# Patient Record
Sex: Male | Born: 1940
Health system: Southern US, Community
[De-identification: ages and names within clinical notes are randomized; demographics above are authoritative.]

## PROBLEM LIST (undated history)

## (undated) DIAGNOSIS — K635 Polyp of colon: Secondary | ICD-10-CM

## (undated) DIAGNOSIS — R251 Tremor, unspecified: Secondary | ICD-10-CM

## (undated) DIAGNOSIS — K439 Ventral hernia without obstruction or gangrene: Secondary | ICD-10-CM

## (undated) DIAGNOSIS — D649 Anemia, unspecified: Secondary | ICD-10-CM

## (undated) DIAGNOSIS — I4719 Other supraventricular tachycardia: Secondary | ICD-10-CM

## (undated) DIAGNOSIS — Z9359 Other cystostomy status: Secondary | ICD-10-CM

## (undated) DIAGNOSIS — G459 Transient cerebral ischemic attack, unspecified: Secondary | ICD-10-CM

## (undated) DIAGNOSIS — I2510 Atherosclerotic heart disease of native coronary artery without angina pectoris: Secondary | ICD-10-CM

## (undated) DIAGNOSIS — I6529 Occlusion and stenosis of unspecified carotid artery: Secondary | ICD-10-CM

## (undated) DIAGNOSIS — R51 Headache: Secondary | ICD-10-CM

## (undated) DIAGNOSIS — J189 Pneumonia, unspecified organism: Secondary | ICD-10-CM

## (undated) DIAGNOSIS — G35D Multiple sclerosis, unspecified: Secondary | ICD-10-CM

## (undated) DIAGNOSIS — E785 Hyperlipidemia, unspecified: Secondary | ICD-10-CM

## (undated) DIAGNOSIS — G709 Myoneural disorder, unspecified: Secondary | ICD-10-CM

## (undated) DIAGNOSIS — G8929 Other chronic pain: Secondary | ICD-10-CM

## (undated) DIAGNOSIS — I498 Other specified cardiac arrhythmias: Secondary | ICD-10-CM

## (undated) DIAGNOSIS — G4700 Insomnia, unspecified: Secondary | ICD-10-CM

## (undated) DIAGNOSIS — I739 Peripheral vascular disease, unspecified: Secondary | ICD-10-CM

## (undated) DIAGNOSIS — M1990 Unspecified osteoarthritis, unspecified site: Secondary | ICD-10-CM

## (undated) DIAGNOSIS — I10 Essential (primary) hypertension: Secondary | ICD-10-CM

## (undated) DIAGNOSIS — R269 Unspecified abnormalities of gait and mobility: Secondary | ICD-10-CM

## (undated) DIAGNOSIS — E870 Hyperosmolality and hypernatremia: Secondary | ICD-10-CM

## (undated) DIAGNOSIS — E876 Hypokalemia: Secondary | ICD-10-CM

## (undated) DIAGNOSIS — A0472 Enterocolitis due to Clostridium difficile, not specified as recurrent: Secondary | ICD-10-CM

## (undated) DIAGNOSIS — R0989 Other specified symptoms and signs involving the circulatory and respiratory systems: Secondary | ICD-10-CM

## (undated) DIAGNOSIS — I471 Supraventricular tachycardia: Secondary | ICD-10-CM

## (undated) DIAGNOSIS — L98429 Non-pressure chronic ulcer of back with unspecified severity: Secondary | ICD-10-CM

## (undated) DIAGNOSIS — N390 Urinary tract infection, site not specified: Secondary | ICD-10-CM

## (undated) DIAGNOSIS — J8410 Pulmonary fibrosis, unspecified: Secondary | ICD-10-CM

## (undated) DIAGNOSIS — D126 Benign neoplasm of colon, unspecified: Secondary | ICD-10-CM

## (undated) DIAGNOSIS — M549 Dorsalgia, unspecified: Secondary | ICD-10-CM

## (undated) DIAGNOSIS — Z87442 Personal history of urinary calculi: Secondary | ICD-10-CM

## (undated) DIAGNOSIS — G9340 Encephalopathy, unspecified: Secondary | ICD-10-CM

## (undated) DIAGNOSIS — I679 Cerebrovascular disease, unspecified: Secondary | ICD-10-CM

## (undated) DIAGNOSIS — G4733 Obstructive sleep apnea (adult) (pediatric): Secondary | ICD-10-CM

## (undated) DIAGNOSIS — G4730 Sleep apnea, unspecified: Secondary | ICD-10-CM

## (undated) DIAGNOSIS — N200 Calculus of kidney: Secondary | ICD-10-CM

## (undated) DIAGNOSIS — R519 Headache, unspecified: Secondary | ICD-10-CM

## (undated) DIAGNOSIS — G35 Multiple sclerosis: Secondary | ICD-10-CM

## (undated) DIAGNOSIS — E039 Hypothyroidism, unspecified: Secondary | ICD-10-CM

## (undated) DIAGNOSIS — I639 Cerebral infarction, unspecified: Secondary | ICD-10-CM

## (undated) DIAGNOSIS — R911 Solitary pulmonary nodule: Secondary | ICD-10-CM

## (undated) HISTORY — DX: Polyp of colon: K63.5

## (undated) HISTORY — DX: Insomnia, unspecified: G47.00

## (undated) HISTORY — DX: Obstructive sleep apnea (adult) (pediatric): G47.33

## (undated) HISTORY — PX: SUPRAPUBIC CATHETER INSERTION: SUR719

## (undated) HISTORY — DX: Atherosclerotic heart disease of native coronary artery without angina pectoris: I25.10

## (undated) HISTORY — DX: Other supraventricular tachycardia: I47.19

## (undated) HISTORY — DX: Unspecified osteoarthritis, unspecified site: M19.90

## (undated) HISTORY — DX: Peripheral vascular disease, unspecified: I73.9

## (undated) HISTORY — PX: BACK SURGERY: SHX140

## (undated) HISTORY — PX: CHOLECYSTECTOMY: SHX55

## (undated) HISTORY — DX: Headache: R51

## (undated) HISTORY — DX: Headache, unspecified: R51.9

## (undated) HISTORY — DX: Other specified symptoms and signs involving the circulatory and respiratory systems: R09.89

## (undated) HISTORY — DX: Unspecified abnormalities of gait and mobility: R26.9

## (undated) HISTORY — DX: Cerebrovascular disease, unspecified: I67.9

## (undated) HISTORY — DX: Calculus of kidney: N20.0

## (undated) HISTORY — DX: Transient cerebral ischemic attack, unspecified: G45.9

## (undated) HISTORY — DX: Multiple sclerosis, unspecified: G35.D

## (undated) HISTORY — DX: Occlusion and stenosis of unspecified carotid artery: I65.29

## (undated) HISTORY — DX: Hyperlipidemia, unspecified: E78.5

## (undated) HISTORY — DX: Multiple sclerosis: G35

## (undated) HISTORY — DX: Supraventricular tachycardia: I47.1

## (undated) HISTORY — DX: Benign neoplasm of colon, unspecified: D12.6

---

## 1969-09-29 HISTORY — PX: INGUINAL HERNIA REPAIR: SUR1180

## 2001-05-24 ENCOUNTER — Ambulatory Visit (HOSPITAL_COMMUNITY): Admission: RE | Admit: 2001-05-24 | Discharge: 2001-05-24 | Payer: Self-pay | Admitting: Neurology

## 2001-05-24 ENCOUNTER — Encounter: Payer: Self-pay | Admitting: Neurology

## 2001-09-24 ENCOUNTER — Emergency Department (HOSPITAL_COMMUNITY): Admission: EM | Admit: 2001-09-24 | Discharge: 2001-09-24 | Payer: Self-pay | Admitting: Emergency Medicine

## 2001-09-24 ENCOUNTER — Encounter: Payer: Self-pay | Admitting: Emergency Medicine

## 2001-09-30 ENCOUNTER — Ambulatory Visit (HOSPITAL_COMMUNITY): Admission: RE | Admit: 2001-09-30 | Discharge: 2001-09-30 | Payer: Self-pay | Admitting: Neurology

## 2001-09-30 ENCOUNTER — Encounter: Payer: Self-pay | Admitting: Neurology

## 2002-10-07 ENCOUNTER — Ambulatory Visit (HOSPITAL_COMMUNITY): Admission: RE | Admit: 2002-10-07 | Discharge: 2002-10-07 | Payer: Self-pay | Admitting: Cardiology

## 2003-09-27 ENCOUNTER — Ambulatory Visit (HOSPITAL_COMMUNITY): Admission: RE | Admit: 2003-09-27 | Discharge: 2003-09-27 | Payer: Self-pay | Admitting: Cardiology

## 2004-08-28 ENCOUNTER — Ambulatory Visit (HOSPITAL_COMMUNITY): Admission: RE | Admit: 2004-08-28 | Discharge: 2004-08-28 | Payer: Self-pay | Admitting: Neurology

## 2004-09-04 ENCOUNTER — Encounter (HOSPITAL_COMMUNITY): Admission: RE | Admit: 2004-09-04 | Discharge: 2004-10-08 | Payer: Self-pay | Admitting: Neurology

## 2004-11-27 HISTORY — PX: COLONOSCOPY: SHX174

## 2004-12-09 ENCOUNTER — Ambulatory Visit: Payer: Self-pay | Admitting: Internal Medicine

## 2004-12-09 ENCOUNTER — Inpatient Hospital Stay (HOSPITAL_COMMUNITY): Admission: EM | Admit: 2004-12-09 | Discharge: 2004-12-15 | Payer: Self-pay | Admitting: *Deleted

## 2004-12-25 ENCOUNTER — Ambulatory Visit: Payer: Self-pay | Admitting: Internal Medicine

## 2005-01-05 ENCOUNTER — Ambulatory Visit: Admission: RE | Admit: 2005-01-05 | Discharge: 2005-01-05 | Payer: Self-pay | Admitting: Family Medicine

## 2005-01-24 ENCOUNTER — Ambulatory Visit: Payer: Self-pay | Admitting: Pulmonary Disease

## 2005-01-27 ENCOUNTER — Ambulatory Visit: Payer: Self-pay | Admitting: Internal Medicine

## 2005-01-27 HISTORY — PX: COLONOSCOPY: SHX174

## 2005-01-30 ENCOUNTER — Ambulatory Visit: Payer: Self-pay | Admitting: Internal Medicine

## 2005-01-30 ENCOUNTER — Ambulatory Visit (HOSPITAL_COMMUNITY): Admission: RE | Admit: 2005-01-30 | Discharge: 2005-01-30 | Payer: Self-pay | Admitting: Internal Medicine

## 2005-05-30 HISTORY — PX: COLONOSCOPY: SHX174

## 2005-06-05 ENCOUNTER — Emergency Department (HOSPITAL_COMMUNITY): Admission: EM | Admit: 2005-06-05 | Discharge: 2005-06-05 | Payer: Self-pay | Admitting: Emergency Medicine

## 2005-06-18 ENCOUNTER — Ambulatory Visit (HOSPITAL_COMMUNITY): Admission: RE | Admit: 2005-06-18 | Discharge: 2005-06-18 | Payer: Self-pay | Admitting: Family Medicine

## 2005-09-29 DIAGNOSIS — D126 Benign neoplasm of colon, unspecified: Secondary | ICD-10-CM

## 2005-09-29 HISTORY — PX: COLONOSCOPY: SHX174

## 2005-09-29 HISTORY — PX: COLON SURGERY: SHX602

## 2005-09-29 HISTORY — PX: APPENDECTOMY: SHX54

## 2005-09-29 HISTORY — DX: Benign neoplasm of colon, unspecified: D12.6

## 2005-10-14 ENCOUNTER — Ambulatory Visit (HOSPITAL_COMMUNITY): Admission: RE | Admit: 2005-10-14 | Discharge: 2005-10-14 | Payer: Self-pay | Admitting: Optometry

## 2006-06-02 ENCOUNTER — Ambulatory Visit (HOSPITAL_COMMUNITY): Admission: RE | Admit: 2006-06-02 | Discharge: 2006-06-02 | Payer: Self-pay | Admitting: Neurology

## 2006-09-29 HISTORY — PX: COLONOSCOPY: SHX174

## 2006-10-27 ENCOUNTER — Ambulatory Visit: Payer: Self-pay | Admitting: Internal Medicine

## 2006-10-27 ENCOUNTER — Ambulatory Visit (HOSPITAL_COMMUNITY): Admission: RE | Admit: 2006-10-27 | Discharge: 2006-10-27 | Payer: Self-pay | Admitting: Internal Medicine

## 2007-10-11 ENCOUNTER — Ambulatory Visit (HOSPITAL_COMMUNITY): Admission: RE | Admit: 2007-10-11 | Discharge: 2007-10-11 | Payer: Self-pay | Admitting: Neurology

## 2007-10-31 HISTORY — PX: COLONOSCOPY: SHX174

## 2007-11-01 LAB — HM COLONOSCOPY

## 2007-11-02 ENCOUNTER — Ambulatory Visit: Payer: Self-pay | Admitting: Internal Medicine

## 2007-11-18 ENCOUNTER — Encounter: Payer: Self-pay | Admitting: Internal Medicine

## 2007-11-18 ENCOUNTER — Ambulatory Visit (HOSPITAL_COMMUNITY): Admission: RE | Admit: 2007-11-18 | Discharge: 2007-11-18 | Payer: Self-pay | Admitting: Internal Medicine

## 2007-11-18 ENCOUNTER — Ambulatory Visit: Payer: Self-pay | Admitting: Internal Medicine

## 2009-01-31 ENCOUNTER — Encounter: Payer: Self-pay | Admitting: Gastroenterology

## 2009-06-06 ENCOUNTER — Encounter (HOSPITAL_COMMUNITY): Admission: RE | Admit: 2009-06-06 | Discharge: 2009-06-28 | Payer: Self-pay | Admitting: Neurology

## 2009-07-03 ENCOUNTER — Encounter (HOSPITAL_COMMUNITY): Admission: RE | Admit: 2009-07-03 | Discharge: 2009-08-02 | Payer: Self-pay | Admitting: Neurology

## 2010-12-12 ENCOUNTER — Other Ambulatory Visit (HOSPITAL_COMMUNITY): Payer: Self-pay | Admitting: Neurology

## 2010-12-12 DIAGNOSIS — M79609 Pain in unspecified limb: Secondary | ICD-10-CM

## 2010-12-12 DIAGNOSIS — R269 Unspecified abnormalities of gait and mobility: Secondary | ICD-10-CM

## 2010-12-13 ENCOUNTER — Ambulatory Visit (HOSPITAL_COMMUNITY)
Admission: RE | Admit: 2010-12-13 | Discharge: 2010-12-13 | Disposition: A | Discharge status: Home / Self Care | Payer: Medicare Other | Source: Ambulatory Visit | Attending: Neurology | Admitting: Neurology

## 2010-12-13 DIAGNOSIS — M79609 Pain in unspecified limb: Secondary | ICD-10-CM

## 2010-12-13 DIAGNOSIS — R51 Headache: Secondary | ICD-10-CM | POA: Insufficient documentation

## 2010-12-13 DIAGNOSIS — R269 Unspecified abnormalities of gait and mobility: Secondary | ICD-10-CM

## 2011-01-03 ENCOUNTER — Emergency Department (HOSPITAL_COMMUNITY)
Admission: EM | Admit: 2011-01-03 | Discharge: 2011-01-04 | Disposition: A | Discharge status: Home / Self Care | Payer: Medicare Other | Attending: Emergency Medicine | Admitting: Emergency Medicine

## 2011-01-03 DIAGNOSIS — R109 Unspecified abdominal pain: Secondary | ICD-10-CM | POA: Insufficient documentation

## 2011-01-03 DIAGNOSIS — M545 Low back pain, unspecified: Secondary | ICD-10-CM | POA: Insufficient documentation

## 2011-01-03 DIAGNOSIS — Z9889 Other specified postprocedural states: Secondary | ICD-10-CM | POA: Insufficient documentation

## 2011-01-03 DIAGNOSIS — Z79899 Other long term (current) drug therapy: Secondary | ICD-10-CM | POA: Insufficient documentation

## 2011-01-03 DIAGNOSIS — N200 Calculus of kidney: Secondary | ICD-10-CM | POA: Insufficient documentation

## 2011-01-03 DIAGNOSIS — I10 Essential (primary) hypertension: Secondary | ICD-10-CM | POA: Insufficient documentation

## 2011-01-03 DIAGNOSIS — R0602 Shortness of breath: Secondary | ICD-10-CM | POA: Insufficient documentation

## 2011-01-03 DIAGNOSIS — M546 Pain in thoracic spine: Secondary | ICD-10-CM | POA: Insufficient documentation

## 2011-01-03 DIAGNOSIS — G35 Multiple sclerosis: Secondary | ICD-10-CM | POA: Insufficient documentation

## 2011-01-03 DIAGNOSIS — E876 Hypokalemia: Secondary | ICD-10-CM | POA: Insufficient documentation

## 2011-01-03 DIAGNOSIS — R10819 Abdominal tenderness, unspecified site: Secondary | ICD-10-CM | POA: Insufficient documentation

## 2011-01-03 DIAGNOSIS — G8929 Other chronic pain: Secondary | ICD-10-CM | POA: Insufficient documentation

## 2011-01-03 DIAGNOSIS — Z8673 Personal history of transient ischemic attack (TIA), and cerebral infarction without residual deficits: Secondary | ICD-10-CM | POA: Insufficient documentation

## 2011-01-03 DIAGNOSIS — R110 Nausea: Secondary | ICD-10-CM | POA: Insufficient documentation

## 2011-01-03 LAB — CBC
HCT: 32 % — ABNORMAL LOW (ref 39.0–52.0)
Hemoglobin: 11.1 g/dL — ABNORMAL LOW (ref 13.0–17.0)
MCH: 33.2 pg (ref 26.0–34.0)
MCHC: 34.7 g/dL (ref 30.0–36.0)
MCV: 95.8 fL (ref 78.0–100.0)
Platelets: 179 10*3/uL (ref 150–400)
RDW: 14 % (ref 11.5–15.5)

## 2011-01-03 LAB — DIFFERENTIAL
Basophils Relative: 0 % (ref 0–1)
Eosinophils Relative: 4 % (ref 0–5)
Monocytes Absolute: 0.3 10*3/uL (ref 0.1–1.0)
Monocytes Relative: 3 % (ref 3–12)
Neutro Abs: 9.1 10*3/uL — ABNORMAL HIGH (ref 1.7–7.7)

## 2011-01-03 LAB — POCT CARDIAC MARKERS
CKMB, poc: 15.7 ng/mL (ref 1.0–8.0)
Myoglobin, poc: 237 ng/mL (ref 12–200)

## 2011-01-04 ENCOUNTER — Emergency Department (HOSPITAL_COMMUNITY): Payer: Medicare Other

## 2011-01-04 LAB — BASIC METABOLIC PANEL
BUN: 11 mg/dL (ref 6–23)
CO2: 29 mEq/L (ref 19–32)
Calcium: 10.1 mg/dL (ref 8.4–10.5)
Chloride: 90 mEq/L — ABNORMAL LOW (ref 96–112)
Creatinine, Ser: 1.13 mg/dL (ref 0.4–1.5)
GFR calc Af Amer: >60 mL/min (ref >60)
GFR calc non Af Amer: >60 mL/min (ref >60)
Glucose, Bld: 151 mg/dL — ABNORMAL HIGH (ref 70–99)
Sodium: 131 mEq/L — ABNORMAL LOW (ref 135–145)

## 2011-01-04 LAB — LIPASE, BLOOD: Lipase: 27 U/L (ref 11–59)

## 2011-01-04 LAB — URINALYSIS, ROUTINE W REFLEX MICROSCOPIC
Bilirubin Urine: NEGATIVE
Glucose, UA: NEGATIVE mg/dL
Hgb urine dipstick: NEGATIVE
Ketones, ur: NEGATIVE mg/dL
Protein, ur: NEGATIVE mg/dL
Specific Gravity, Urine: 1.01 (ref 1.005–1.030)
Urobilinogen, UA: 0.2 mg/dL (ref 0.0–1.0)
pH: 7.5 (ref 5.0–8.0)

## 2011-01-04 LAB — POCT CARDIAC MARKERS
CKMB, poc: 19.6 ng/mL (ref 1.0–8.0)
CKMB, poc: 18 ng/mL (ref 1.0–8.0)
Myoglobin, poc: 253 ng/mL (ref 12–200)
Troponin i, poc: <0.05 ng/mL (ref 0.00–0.09)
Troponin i, poc: <0.05 ng/mL (ref 0.00–0.09)

## 2011-01-04 MED ORDER — IOHEXOL 300 MG/ML  SOLN
100.0000 mL | Freq: Once | INTRAMUSCULAR | Status: AC | PRN
  Administered 2011-01-04: 100 mL via INTRAVENOUS

## 2011-02-11 NOTE — Assessment & Plan Note (Signed)
NAMEHUSSEIN, Jordan Ward                   CHART#:  [id number]   DATE:  11/02/2007                       DOB:  [birth year]   CHIEF COMPLAINT:  Chronic diarrhea.   SUBJECTIVE:  The patient is a 70 year old male who has had a one-month  history of daily diarrhea.  His history is complicated with the fact  that he is status post left hemicolectomy.  He presented with a large  polyp at the splenic flexure.  He was referred to Dr. Arsenio Loader at [facility] for submucosal resection.  Dr. Arsenio Loader felt  the polyp was too large and therefore recommended surgery.  He also had  multiple smaller polyps as well.  He had a left hemicolectomy by  Dr  .Lindalou Hose in January 2007.  He had a follow-up colonoscopy with Dr.  Laural Golden on October 27, 2006.  There was no evidence of recurrent polyps  and a normal TI.  Approximately one month ago, he began having diarrhea.  He feels like every time he eats everything runs straight through him.  He denies any new medications.  Denies any ill contacts.  Denies any  recent travel.  He does admit he is afraid to eat and has therefore  stopped eating over the past week.  He has 10+ softer watery brown  stools per day.  He denies any rectal bleeding or melena.  Denies any  mucus in his stool.  He denies abdominal pain, heartburn, or  indigestion.  His weight is down about 10 pounds in the last month.  He  tells me he had negative stool studies through Dr. Hulen Luster office, as  well as lab work.  He has tried Lonox for his diarrhea which does not  seem to help.  He denies any fever or chills.  Denies any nausea or  vomiting.   CURRENT MEDICATIONS:  Refer to list from November 02, 2007.   ALLERGIES:  TETRACYCLINE.   OBJECTIVE:  VITAL SIGNS:  Weight 151 pounds, height 66 inches,  temperature 97.1, blood pressure 130/64, and pulse 64.  GENERAL:  The patient is an ill-appearing Caucasian male, who is alert,  oriented, pleasant, and cooperative, in no acute  distress.  HEENT:  Sclerae anicteric.  Conjunctivae pink.  Oropharynx pink and  moist, without lesions.  CHEST:  Heart with regular rate and rhythm.  Normal S1, S2.  ABDOMEN:  Positive bowel sounds x4.  No bruits auscultated.  Soft,  nontender, nondistended, without palpable mass or hepatosplenomegaly.  No rebound tenderness or guarding.  EXTREMITIES:  Without clubbing or edema bilaterally.   LABORATORY STUDIES:  We were called with a critical lab of potassium of  2.4.  He had a sodium of 128, chloride 83, CO2 36, glucose 112, BUN 6,  creatinine 1.28, total bilirubin 1, alkaline phosphatase 94, AST 46, ALT  20, total protein 7, albumin 4.3, and calcium 9.7.   ASSESSMENT:  The patient is a 70 year old male status post left  hemicolectomy for large adenomatous polyp with high-grade dysplasia and  multiple additional polyps.  He has had a one-month history of  postprandial diarrhea every time he eats, upwards of 10+ stools per day.  Etiology of his diarrhea is unclear at this time, and we should rule out  infectious etiology including Clostridium difficile.  I have discussed  this case further with Dr. Gala Romney.  Our plan of care is outlined below.   PLAN:  1. He was given Questran 4 g daily or b.i.d. to help with the      constipation, not to take within 2 hours of other medications, one      month, no refills.  Follow up on CBC that was ordered today.  2. Begin potassium chloride 20 mEq 2 p.o. now, then 2 at bedtime, then      1 p.o. b.i.d., #40, with no refills.  Recheck potassium on Friday      morning at 10 a.m.  3. Need stool studies from [place].  4. Would recheck Clostridium difficile even if it was negative at      [place].  5. Increase fluids and Gatorade.  6. If there is no improvement, then he may need IV fluids and/or      hospitalization.       Vickey Huger, N.P.  Electronically Signed     R. Garfield Cornea, M.D.  Electronically Signed    KJ/MEDQ  D:   11/02/2007  T:  11/03/2007  Job:  [id number]   cc:   Bonne Dolores, M.D.

## 2011-02-11 NOTE — Op Note (Signed)
NAME:  Jordan Ward, Jordan Ward                  ACCOUNT NO.:  [id number]   MEDICAL RECORD NO.:  [id number]          PATIENT TYPE:  AMB   LOCATION:  DAY                           FACILITY:  [facility]   PHYSICIAN:  R. Garfield Cornea, M.D. DATE OF BIRTH:  July 09, 1941   DATE OF PROCEDURE:  11/18/2007  DATE OF DISCHARGE:                               OPERATIVE REPORT   PROCEDURE:  Ileocolonoscopy with segmental biopsy and stool collection.   INDICATIONS FOR PROCEDURE:  70 year old gentleman least a 73-month  history of watery nonbloody diarrhea.  He is status post partial  colectomy for a large polyp previously.  Dr. Laural Golden last did a  surveillance exam on him in early 2008 without significant findings.  He  has gone from constipation to watery diarrhea for the past for months  was been associated with electrolyte abnormalities.  Initial set of  stool studies was positive only for __________. He took some Questran  for a short period as he reports, but it did not help much.  Ileocolonoscopy is now being done to further evaluate protracted  refractory diarrhea.  This approach has been discussed with the patient  at length.  Potential risks, benefits and alternatives have been  reviewed, questions answered.  He agreeable.  Please see documentation  in the medical record.   PROCEDURE NOTE:  O2 saturation, blood pressure, pulse and respirations  have been monitored throughout the entire procedure.   CONSCIOUS SEDATION:  Versed 4 mg IV, Demerol 100 mg IV in divided doses.   INSTRUMENT:  Pentax video chip system.   Digital rectal exam revealed a lax sphincter tone and essentially nil  voluntary squeeze.   ENDOSCOPIC FINDINGS:  The prep was adequate.  Colon:  The colonic mucosa was surveyed from rectosigmoid junction all  the way through to the cecum, appendiceal orifice, and  ileocecal valve.  These structures well seen and photographed for the record.  Terminal  ileum was intubated a good 15 cm.  From this  level the scope was slowly  and cautiously withdrawn, all previously mentioned mucosal surfaces were  again seen.  The colonic mucosa appeared entirely normal.  Location of  prior surgical anastomosis was inapparent.  The mucosa looked good.  The  terminal ileum mucosa also looked good.  Biopsies of the ascending  segment and distal colonic mucosa were taken to rule out microscopic  colitis.  Also fresh stool samples taken for repeat stool studies.  Scope was pulled down the rectum where a thorough examination of the  rectal mucosa including retroflex view of the anal verge demonstrated a  couple of tiny mammillations, otherwise rectal mucosa appeared normal  and it was biopsied also for histologic study.  The patient tolerated  the procedure very well, was reacted in endoscopy.   IMPRESSION:  1. Normal-appearing rectum aside from distal mammillations, status      post mucosal biopsy.  2. Normal residual colonic mucosa status post segmental biopsy and      stool sampling.  3. Normal terminal ileum.   RECOMMENDATIONS:  Will send a repeat set of stool  studies today.  Will  also qualitative [place] smear for fat.  Will follow-up on the above-  mentioned studies. Will make further recommendations in the very near  future.   DISCUSSION:  The patient has multiple sclerosis which may or may not be  contributing to his symptoms.  He has a very weak external anal squeeze.  This combined with recent foreshortening of his colon via segmental  resection of his colon may be contributing to the clinical picture as  well.  With further recommendations, will follow in the near future.      Bridgette Habermann, M.D.  Electronically Signed     RMR/MEDQ  D:  11/18/2007  T:  11/19/2007  Job:  [id number]   cc:   Bonne Dolores, M.D.  Fax: [phone]

## 2011-02-14 NOTE — H&P (Signed)
NAME:  Jordan Ward, Jordan Ward                  ACCOUNT NO.:  [id number]   MEDICAL RECORD NO.:  [id number]          PATIENT TYPE:  AMB   LOCATION:  DAY                           FACILITY:  [facility]   PHYSICIAN:  Hildred Laser, M.D.    DATE OF BIRTH:  31-Oct-1940   DATE OF ADMISSION:  DATE OF DISCHARGE:  LH                                HISTORY & PHYSICAL   PRESENTING PROBLEM:  Large colonic polyp.  Here to discuss colonoscopy  polypectomy.   HISTORY OF PRESENT ILLNESS:  Jordan Ward is a 70 year old Caucasian male who is  admitted to [facility] with copious diarrhea.  His stool studies are  negative.  He had colonoscopy on December 10, 2004 which revealed no evidence  of colitis, a large sessile polyp at splenic flexure.  Endoscopically it  appeared to be benign.  Biopsy showed tubulovillous adenoma.  Colonoscopy  was delayed until he was over his acute illness.  During that  hospitalization he also had small-bowel study which was normal.  His  diarrhea has completely resolved.  In fact, he is now constipated.  On his  last visit we suggested Colace but he says it is not helping.  He took two  suppository this morning.  He denies abdominal pain, melena or rectal  bleeding.  His appetite is back to normal.  He has gained 10 pound since his  last visit about four weeks ago.  He is having sleep problems.  He recently  had sleep study but the results are not known.   CURRENT MEDICATIONS:  1.  Prozac, question dose daily.  2.  Prilosec over-the-counter 20 mg q. a.m.  3.  Questran half pack 2-3 times a day.  4.  __________, question dose daily.  5.  HCTZ 25 mg daily.  6.  Reglan 5 mg a.c. and q.h.s.  7.  Tegretol 100 mg daily.  8.  Clorazepate 7.5 mg t.i.d.  9.  Clonidine 0.2 mg b.i.d.  10. Verapamil 100 mg b.i.d.  11. Colace two tablets at bedtime.   PAST MEDICAL HISTORY:  He has MS, arthritis, insomnia, hypertension,  peripheral vascular disease, history of TIA's.  He has had back surgery for  disc  problems in 1976 and again in 1979.  He has had bilateral inguinal  herniorrhaphy. He is also status post cholecystectomy.   ALLERGIES:  TETRACYCLINE WHICH CAUSE RASH.   FAMILY HISTORY:  Negative for colorectal carcinoma.   PHYSICAL EXAMINATION:  VITAL SIGNS:  Weight 178.5 pounds, he is 67 inches  tall, pulse 76 per minute, blood pressure 140/80, temperature 98.  HEENT:  Conjunctivae is pink, sclerae is nonicteric.  Oropharyngeal mucosa  is normal.  NECK:  No neck masses are noted.  HEART:  Lung sounds are within normal limits.  ABDOMEN:  Protuberant but soft and nontender without organomegaly or masses.  He does not have peripheral edema.   ASSESSMENT:  Jordan Ward was found to have a large sessile polyp at splenic  flexure.  He is not having any symptoms in the way of bleeding or rectal  discharge.  Options reviewed  with the patient which include colonoscopy  polypectomy versus surgery.  He would prefer to have it removed via  colonoscopy.  The patient was informed that if any risk of carcinoma is  found in the resected specimen he will still require surgery.  We also  discussed the risks such as bleeding and perforation.  The patient would  like to proceed with colonoscopy.   RECOMMENDATIONS:  Colonoscopy with polypectomy to be performed at [facility] within  the next week or two.  The patient advised not to take any aspirin.   Not mentioned above he has a history of hypernatremia.  Metabolic seven will  be checked at the time of colonoscopy.      NR/MEDQ  D:  01/27/2005  T:  01/27/2005  Job:  [id number]   cc:   Bonne Dolores, M.D.  [address] [place] [address]  Fax: [phone]

## 2011-02-14 NOTE — Consult Note (Signed)
NAMEFLORENTINO, Jordan Ward                  ACCOUNT NO.:  [id number]   MEDICAL RECORD NO.:  [id number]          PATIENT TYPE:  INP   LOCATION:  A202                          FACILITY:  [facility]   PHYSICIAN:  Hildred Laser, M.D.    DATE OF BIRTH:  1941-04-10   DATE OF CONSULTATION:  DATE OF DISCHARGE:                                   CONSULTATION   REQUESTING PHYSICIAN:  Dr. Caron Presume.   REASON FOR CONSULTATION:  A two-week history of diarrhea, hypokalemia and  dehydration.   HISTORY OF PRESENT ILLNESS:  Jordan Ward is a 70 year old Caucasian male with  history of MS who reports a two-week history of watery diarrhea, which is  intractable.  He notes symptoms pretty much on a daily basis, although he  may have a day's rest if he takes several Imodium.  He had a similar episode  back in December, which only lasted around 12 hours.  Since this episode, he  has had two episodes of vomiting and nausea.  He denies any fever or chills,  abdominal pain, heartburn, indigestion, dysphagia or odynophagia.  He does  have a history of chronic constipation and has gone up to one week without  bowel movements.  He has noticed a small amount of rectal bleeding, which is  bright red, on the toilet paper and suspected this was due to hemorrhoids.  He denies any melena.  He denies any contact with viral illness, viral  gastroenteritis or any new pets, foreign travel or antibiotics.  He has  never had a colonoscopy.  He also reports that over the last two weeks, he  has had a couple of episodes of fecal incontinence as well.  He was found to  be hypokalemic in the emergency room with potassium of 2.5 and was unable to  tolerate IV potassium.  He has since been given p.o. supplementation.   PAST MEDICAL HISTORY:  1.  Arthritis.  2.  MS, followed by Dr. Jannifer Franklin at [facility].  3.  Insomnia.  4.  Hypertension.  5.  Peripheral vascular disease.  6.  Carotid artery stenosis.  7.  TIAs.  8.  Cholecystectomy,  by Dr. Tamala Julian several years ago for cholelithiasis.  9.  Bilateral inguinal hernia repair in 1971.  10. Ruptured disks x 2, with back surgery in 1976 and 1979.   MEDICATIONS PRIOR TO ADMISSION:  1.  Tegretol 200 mg b.i.d.  2.  Tranxene 7.5 mg t.i.d.  3.  Diovan 1/2 tablet daily.  4.  Hydrochlorothiazide 25 mg daily.  5.  Clonidine 0.2 mg daily.  6.  Lorcet p.r.n.  7.  Aspirin 81 mg daily.  8.  Verapamil 180 mg 2 tablets at bedtime.  9.  Occasional Tylenol.   ALLERGIES:  TETRACYCLINE, which causes rash.   FAMILY HISTORY:  No known family history of colorectal carcinoma.  He did  have one brother deceased with suspected alcoholic cirrhosis.  Mother  deceased secondary to CVA in her 72s and father deceased secondary to  coronary artery disease at age 16.  He has one healthy sister and  one  brother deceased due to MI.   SOCIAL HISTORY:  Jordan Ward has been married for 40 years.  He has two grown  healthy children.  He denies any alcohol use.  He denies any drug use.  He  is currently retired and has been on disability.  He reports a 20-plus-pack-  year history of tobacco use, quitting approximately 12 years ago.   REVIEW OF SYSTEMS:  CONSTITUTIONAL:  Weight is down about 15 pounds with  this acute illness.  He is also complaining of anorexia.  He denies any  fever or chills.  CARDIOVASCULAR:  Denies any chest pain or palpitations.  PULMONARY:  Denies any shortness of breath, dyspnea, cough or hemoptysis.  ENDOCRINE:  He denies any history of diabetes mellitus or thyroid disorder.  MUSCULOSKELETAL:  He has noted gradual residual weakness all over.  Denies  any focal weakness.   PHYSICAL EXAMINATION:  VITAL SIGNS:  Temp 97.4, pulse 67, respirations 20,  blood pressure 138/84.  Height 67 inches.  Weight 163.5 pounds.  GENERAL:  Jordan Ward is a 70 year old Caucasian male who is alert, oriented,  pleasant and cooperative in no acute distress.  He is accompanied by his  wife today.  HEENT:   Conjunctivae are clear.  Sclerae are nonicteric.  __________.  Oropharynx is pink and moist without any lesions.  NECK:  Supple without mass or thyromegaly.  CHEST/HEART:  Regular rate and rhythm.  Normal S1 and S2 without murmurs,  clicks, rubs or gallops.  LUNGS:  Clear to auscultation bilaterally.  ABDOMEN:  Positive bowel sounds.  No bruits auscultated.  Soft, nontender  and nondistended without palpable mass or hepatosplenomegaly.  No rebound  tenderness or guarding.  EXTREMITIES:  Two-plus pedal pulses bilaterally.  No edema.  SKIN:  Pink, warm and dry without rash or jaundice.   LABORATORY STUDIES:  WBC 9.1, hemoglobin 13, hematocrit 36.5, platelets 250,  MCV 95.7.  Calcium 8.7, sodium 129, potassium 2.5, chloride 91, CO2 28, BUN  17, creatinine 1.8, glucose 111.  Total bilirubin 0.9, alkaline phosphatase  89, SGOT 25, SGPT 18, total protein 6.7, albumin 4.3 and lipase 25.  Urinalysis is positive for 15 ketones and 30 gm of protein.   IMPRESSION:  Jordan Ward is a 70 year old Caucasian male with a two-week  history of intractable watery diarrhea.  He has also noted intermittent  hematochezia on toilet paper in small amounts over the last couple of weeks.  Stool studies are pending at this time.  He also has significant hypokalemia  with a potassium of 2.5.  He was unable to tolerate parental potassium  supplementation in the emergency room.  He is also significantly dehydrated.  He is going to need colonoscopy for further evaluation of his refractory  diarrhea.  Of course, C. diff colitis should be ruled out as well as  inflammatory bowel disease.  We also need to rule out occult colon carcinoma  with spurious diarrhea.   RECOMMENDATIONS:  1.  Will follow up with a full set of stool studies.  2.  He is going to need IV potassium supplementation.  3.  Will schedule colonoscopy as soon as possible with Dr. Laural Golden.  We would like to thank Dr. Caron Presume for allowing [place] to participate  in the  care of Jordan Ward.      KC/MEDQ  D:  12/09/2004  T:  12/10/2004  Job:  [id number]   cc:   Bonne Dolores, M.D.  [address]  [place] [address]  Fax: [phone]

## 2011-02-14 NOTE — Discharge Summary (Signed)
Jordan Ward, Jordan Ward                  ACCOUNT NO.:  [id number]   MEDICAL RECORD NO.:  [id number]          PATIENT TYPE:  INP   LOCATION:  A202                          FACILITY:  [facility]   PHYSICIAN:  Bonne Dolores, M.D.    DATE OF BIRTH:  [birth year]   DATE OF ADMISSION:  12/09/2004  DATE OF DISCHARGE:  03/19/2006LH                                 DISCHARGE SUMMARY   DISCHARGE DIAGNOSES:  1.  Diarrhea probably secondary to a very large sessile polyp at the splenic      flexure documented by colonoscopy, another polyp present at 6 cm.  2.  Multiple sclerosis.  3.  Chronic pain syndrome secondary to #2.  4.  Status post cholecystectomy, hernia repair.  5.  History of hypertension which has been controlled.   For details regarding admission, please refer to the admitting note.  Briefly, this 70 year old male with the above history presented to the ER  with a 2-3 week history of nausea, vomiting, and a 48 hour history of  increasingly severe watery diarrhea.  There was no associated melena,  hematemesis, or hematochezia.   In the emergency room, the patient was obviously dehydrated.  Vital signs  were stable without significant orthostasis.  Laboratory revealed a white  count of 9000, normal H&H.  His sodium was 129, potassium 2.5, BUN 17,  creatinine 1.8, liver functions normal, urinalysis clear.   The patient was admitted to the floor for hydration and further evaluation.  Dr. Laural Golden was consulted; a small-bowel follow-through was performed which  revealed slow transit but no obstruction.  Colonoscopy revealed a very large  sessile polyp at the splenic flexure.  Biopsies obtained revealed an  inflamed tubulovillous adenoma.  The colonic mucosa, etc., was normal.  It  was felt that several colonoscopies would be necessary to remove the entire  polyp.  It is felt that the villous adenoma most likely is responsible for  the diarrhea.   The patient continued to improve, and he was stable  for discharge on the 6th  hospital day.   DISPOSITION MEDICATIONS:  1.  Questran and Reglan q.i.d.  2.  He will continue Tegretol 200 mg b.i.d.  3.  Tranxene 7.5 t.i.d.  4.  Diovan 80 daily.  5.  HCTZ 25 daily.  6.  Clonidine 0.1 mg b.i.d.  7.  Lorcet Plus.   He will followed and treated expectantly as an outpatient.  Dr. Laural Golden will  schedule and perform procedures as necessary.      MC/MEDQ  D:  01/04/2005  T:  01/04/2005  Job:  [id number]

## 2011-02-14 NOTE — Procedures (Signed)
Jordan Ward, Jordan Ward                  ACCOUNT NO.:  [id number]   MEDICAL RECORD NO.:  [id number]          PATIENT TYPE:  OUT   LOCATION:  SLEEP LAB                     FACILITY:  [facility]   PHYSICIAN:  Danton Sewer, M.D. [facility] DATE OF BIRTH:  03/14/1941   DATE OF STUDY:  01/05/2005                              NOCTURNAL POLYSOMNOGRAM   REFERRING PHYSICIAN:  Dr. Bonne Dolores.   INDICATION FOR THE STUDY:  Hypersomnia with sleep apnea.   SLEEP ARCHITECTURE:  The patient had a total sleep time of 241 minutes with  significantly decreased REM. Sleep onset latency was normal and REM onset  was prolonged at 209 minutes. The patient was a very restless sleeper and  sleep efficiency was only 59%.   IMPRESSION:  1.  Moderate obstructive sleep apnea/hypopnea syndrome with a respiratory      disturbance index of 29 events per hour and oxygen desaturation as low      as 79%. Events were not positional. Possible treatment options include      weight loss, upper airway surgery, or possibly CPAP. Treatment decisions      should be based on both severity of symptoms and patient lifestyle.  2.  Moderate to loud snoring noted throughout the study.  3.  No clinically significant cardiac arrhythmias.  4.  No significant periodic leg movements.      KC/MEDQ  D:  01/20/2005 15:02:07  T:  01/20/2005 15:23:56  Job:  [id number]

## 2011-03-28 ENCOUNTER — Other Ambulatory Visit: Payer: Self-pay | Admitting: Gastroenterology

## 2011-06-11 ENCOUNTER — Other Ambulatory Visit: Payer: Self-pay | Admitting: Gastroenterology

## 2011-06-16 ENCOUNTER — Telehealth: Payer: Self-pay

## 2011-06-16 NOTE — Telephone Encounter (Signed)
Pt called- stated he needed a refill on cholestyramine. He is still having diarrhea. Advised pt that we have not seen him since 10/2007 and that he would need an OV. Pt said ok. Please schedule OV.

## 2011-06-16 NOTE — Telephone Encounter (Signed)
Pt 's wife called by wanting to know if she could make an appointment so I made him an appointment to see Magda Paganini Tuesday at 10:30.

## 2011-06-17 ENCOUNTER — Ambulatory Visit (INDEPENDENT_AMBULATORY_CARE_PROVIDER_SITE_OTHER): Payer: Medicare Other | Admitting: Gastroenterology

## 2011-06-17 ENCOUNTER — Encounter: Payer: Self-pay | Admitting: Gastroenterology

## 2011-06-17 VITALS — BP 139/83 | HR 68 | Temp 97.2°F | Ht 66.0 in | Wt 151.6 lb

## 2011-06-17 DIAGNOSIS — D649 Anemia, unspecified: Secondary | ICD-10-CM

## 2011-06-17 DIAGNOSIS — Z86010 Personal history of colonic polyps: Secondary | ICD-10-CM

## 2011-06-17 DIAGNOSIS — K529 Noninfective gastroenteritis and colitis, unspecified: Secondary | ICD-10-CM | POA: Insufficient documentation

## 2011-06-17 DIAGNOSIS — R197 Diarrhea, unspecified: Secondary | ICD-10-CM

## 2011-06-17 NOTE — Progress Notes (Signed)
Primary Care Physician:  Glo Herring., MD  Primary Gastroenterologist:  Garfield Cornea, MD   Chief Complaint  Patient presents with  . refills    HPI:  Jordan Ward is a 70 y.o. male here for f/u of chronic diarrhea. Last seen 10/2007. He has h/o numerous adenomatous polyps and left hemicolectomy as outlined below. He is due for surveillance TCS in 10/2012. He does well as long as he takes [place]. Takes 4g daily. Actually only has 2 BMs per week with this regimen, stools are somewhat hard. Denies melena, brbpr. States he was diagnosed with kidney stone  In 12/2010 and he continues to have suprapubic pain. Plans to make appt with [facility] in [place], wants the phone number. Denies n/v. No heartburn. No dysphagia/odynophagia. No weight loss.  Current Outpatient Prescriptions  Medication Sig Dispense Refill  . cholestyramine (QUESTRAN) 4 G packet 4 g 2 (two) times daily as needed.       . cloNIDine (CATAPRES) 0.2 MG tablet 1 tablet 2 (two) times daily.       . diazepam (VALIUM) 5 MG tablet 1 tablet 2 (two) times daily.       . hydrochlorothiazide (HYDRODIURIL) 25 MG tablet 1 tablet daily.       . hydrocodone-acetaminophen (LORCET PLUS) 7.5-650 MG per tablet 1 tablet every 6 (six) hours as needed.       . TEGRETOL 200 MG tablet 1 tablet 2 (two) times daily.       . verapamil (CALAN-SR) 180 MG CR tablet 1 tablet 2 (two) times daily.         Allergies as of 06/17/2011 - Review Complete 06/17/2011  Allergen Reaction Noted  . Tetracyclines & related Rash 06/17/2011    Past Medical History  Diagnosis Date  . Arthritis   . MS (multiple sclerosis)   . Insomnia   . HTN (hypertension)   . PVD (peripheral vascular disease)   . Carotid artery stenosis   . TIA (transient ischemic attack)   . Kidney stones     Past Surgical History  Procedure Date  . Cholecystectomy   . Inguinal hernia repair 1971    bilateral  . Back surgery 1976/1979  . Colonoscopy 11/2004    Dr.  Sharol Roussel sessile polyp splenic flexure, 63mm sessile polyp desc colon, tubulovillous adenoma (bx not removed)  . Colonoscopy 01/2005    poor prep, polyp could not be found  . Colonoscopy 05/2005    with EMR, polypectomy Dr. Olegario Messier, bx showed high grade dysplasia, partially resected  . Colonoscopy 09/2005    Dr. Arsenio Loader, [place] ink tattooing, four villous colon polyp (3 had been missed on previous colonoscopies due to limitations of procedures  . Colon surgery 09/2005    four tubular adenomas, no cancer  . Appendectomy 09/2005    at time of left hemicolectomy  . Colonoscopy 09/2006    normal TI, no polyps  . Colonoscopy 10/2007    Dr. Imogene Burn distal mammillations, benign bx, normal TI, random bx neg for microscopic colitis    Family History  Problem Relation Age of Onset  . Colon cancer Neg Hx   . Cirrhosis Brother     etoh  . Stroke Mother 26  . Coronary artery disease Father 49  . Heart attack Brother     History   Social History  . Marital Status: Married    Spouse Name: N/A    Number of Children: N/A  . Years of Education: N/A   Occupational History  .  disability    Social History Main Topics  . Smoking status: Former Smoker    Types: Cigarettes  . Smokeless tobacco: Not on file   Comment: quit remote  . Alcohol Use: No  . Drug Use: No  . Sexually Active: Not on file   Other Topics Concern  . Not on file   Social History Narrative  . No narrative on file      ROS:  General: Negative for anorexia, weight loss, fever, chills, fatigue, weakness. Eyes: Negative for vision changes.  ENT: Negative for hoarseness, difficulty swallowing , nasal congestion. CV: Negative for chest pain, angina, palpitations, dyspnea on exertion, peripheral edema.  Respiratory: Negative for dyspnea at rest, dyspnea on exertion, cough, sputum, wheezing.  GI: See history of present illness. GU:  Negative for dysuria, hematuria, urinary incontinence, urinary frequency,  nocturnal urination. See HPI. MS: Negative for joint pain, low back pain.  Derm: Negative for rash or itching.  Neuro: Negative for abnormal sensation, seizure, frequent headaches, memory loss, confusion. Generalized weakness secondary to MS. Psych: Negative for anxiety, depression, suicidal ideation, hallucinations.  Endo: Negative for unusual weight change.  Heme: Negative for bruising or bleeding. Allergy: Negative for rash or hives.    Physical Examination:  BP 139/83  Pulse 68  Temp(Src) 97.2 F (36.2 C) (Temporal)  Ht 5\' 6"  (1.676 m)  Wt 151 lb 9.6 oz (68.765 kg)  BMI 24.47 kg/m2   General: Well-nourished, well-developed in no acute distress. Accompanied by wife. Head: Normocephalic, atraumatic.   Eyes: Conjunctiva pink, no icterus. Mouth: Oropharyngeal mucosa moist and pink , no lesions erythema or exudate. Neck: Supple without thyromegaly, masses, or lymphadenopathy.  Lungs: Clear to auscultation bilaterally.  Heart: Regular rate and rhythm, no murmurs rubs or gallops.  Abdomen: Bowel sounds are normal, nontender, nondistended, no hepatosplenomegaly or masses, no abdominal bruits or    hernia , no rebound or guarding.   Rectal: Not performed. Extremities: No lower extremity edema. No clubbing or deformities.  Neuro: Alert and oriented x 4 , grossly normal neurologically.  Skin: Warm and dry, no rash or jaundice.   Psych: Alert and cooperative, normal mood and affect.  Labs: Lab Results  Component Value Date   WBC 10.4 01/03/2011   HGB 11.1* 01/03/2011   HCT 32.0* 01/03/2011   MCV 95.8 01/03/2011   PLT 179 01/03/2011     CT A/P: 12/2010  IMPRESSION:  1. Very mild left-sided hydronephrosis, with prominence of the  tortuous left ureter to the level of an obstructing 0.9 x 0.6 cm  stone in the distal left ureter, 1-2 cm above the left  vesicoureteral junction. Associated perinephric fluid noted.  2. Chronic complete occlusion of the distal abdominal aorta, just  distal to  the origins of the renal arteries. This appears stable  from 2007; the femoral arteries are reconstituted bilaterally by  anterior and lateral abdominal [place] collateral vessels.  3. Multiple bilateral nonobstructing renal stones noted; bilateral  renal cysts seen.  4. Periportal edema may reflect rehydration.  5. Residual contrast within the distal esophagus may reflect  recent ingestion of contrast, mild esophageal dysmotility or  reflux.  6. Small left inguinal hernia, containing only fat.  7. Significantly distended bladder, compatible with the patient's  history of difficulty voiding. Recommend Foley catheter placement,  as deemed clinically appropriate.  8.Scattered small hypodensities are noted arising from both kidneys,  particularly on the left, measuring up to 2.2 cm in size.

## 2011-06-18 ENCOUNTER — Encounter: Payer: Self-pay | Admitting: Gastroenterology

## 2011-06-18 DIAGNOSIS — D649 Anemia, unspecified: Secondary | ICD-10-CM | POA: Insufficient documentation

## 2011-06-18 NOTE — Assessment & Plan Note (Signed)
Surveillance colonoscopy due 10/2012 unless problems arise sooner. Patient did have low H/H in 12/2010 when at hospital with kidney stone. Will recheck now.

## 2011-06-18 NOTE — Progress Notes (Signed)
Cc to PCP 

## 2011-06-18 NOTE — Progress Notes (Signed)
Please let patient know, after computer came back up, I reviewed his records from ED visit at [facility] 12/2010.   1. Recommend check CBC b/c he was anemic at that time. 2. Recommend he see urologist, gave number to them yesterday, for abnormal findings on CT, multiple renal lesions/stones/?cysts and ureteral stone. 3. Recommend he see his PCP regarding chronic complete occlusion of the distal abdominal aorta. 4. Please update his allergies. 5. Send copy of CT to PCP and urologist.

## 2011-06-18 NOTE — Assessment & Plan Note (Signed)
Doing well with regards to chronic diarrhea. Slight constipation on Questran. Recommend trying 1/2 dose every other night alternating with full dose. If still not having regular bms, then try 1/2 dose (2g) every night. New RX provided. Written RX given as computers were down, #60packs, 5 refills.

## 2011-06-18 NOTE — Progress Notes (Signed)
Pt is only allergic to tetracycline

## 2011-06-18 NOTE — Progress Notes (Signed)
Pt and pts wife aware. Lab order in mail to pt. Please cc pcp and urologist

## 2011-06-20 LAB — CLOSTRIDIUM DIFFICILE EIA: C difficile Toxins A+B, EIA: NEGATIVE

## 2011-06-20 LAB — FECAL LACTOFERRIN, QUANT: Fecal Lactoferrin: NEGATIVE

## 2011-06-20 LAB — STOOL CULTURE

## 2011-06-20 LAB — OVA AND PARASITE EXAMINATION: Ova and parasites: NONE SEEN

## 2011-06-20 LAB — FECAL FAT, QUALITATIVE: Neutral Fat: NORMAL

## 2011-07-18 ENCOUNTER — Other Ambulatory Visit: Payer: Self-pay | Admitting: Urology

## 2011-07-18 ENCOUNTER — Ambulatory Visit (INDEPENDENT_AMBULATORY_CARE_PROVIDER_SITE_OTHER): Payer: Medicare Other | Admitting: Urology

## 2011-07-18 DIAGNOSIS — N3941 Urge incontinence: Secondary | ICD-10-CM

## 2011-07-18 DIAGNOSIS — R339 Retention of urine, unspecified: Secondary | ICD-10-CM

## 2011-07-18 DIAGNOSIS — N201 Calculus of ureter: Secondary | ICD-10-CM

## 2011-07-18 DIAGNOSIS — N200 Calculus of kidney: Secondary | ICD-10-CM

## 2011-07-21 ENCOUNTER — Ambulatory Visit (HOSPITAL_COMMUNITY)
Admission: RE | Admit: 2011-07-21 | Discharge: 2011-07-21 | Disposition: A | Discharge status: Home / Self Care | Payer: Medicare Other | Source: Ambulatory Visit | Attending: Urology | Admitting: Urology

## 2011-07-21 DIAGNOSIS — R932 Abnormal findings on diagnostic imaging of liver and biliary tract: Secondary | ICD-10-CM | POA: Insufficient documentation

## 2011-07-21 DIAGNOSIS — R1032 Left lower quadrant pain: Secondary | ICD-10-CM | POA: Insufficient documentation

## 2011-07-21 DIAGNOSIS — N1330 Unspecified hydronephrosis: Secondary | ICD-10-CM | POA: Insufficient documentation

## 2011-07-21 DIAGNOSIS — N201 Calculus of ureter: Secondary | ICD-10-CM

## 2011-07-21 DIAGNOSIS — N200 Calculus of kidney: Secondary | ICD-10-CM | POA: Insufficient documentation

## 2011-07-25 ENCOUNTER — Ambulatory Visit (INDEPENDENT_AMBULATORY_CARE_PROVIDER_SITE_OTHER): Payer: Medicare Other | Admitting: Urology

## 2011-07-25 DIAGNOSIS — R339 Retention of urine, unspecified: Secondary | ICD-10-CM

## 2011-07-25 DIAGNOSIS — N289 Disorder of kidney and ureter, unspecified: Secondary | ICD-10-CM

## 2011-07-29 ENCOUNTER — Ambulatory Visit: Payer: Medicare Other | Admitting: Urology

## 2011-08-11 ENCOUNTER — Other Ambulatory Visit: Payer: Self-pay | Admitting: Gastroenterology

## 2011-08-15 ENCOUNTER — Ambulatory Visit (INDEPENDENT_AMBULATORY_CARE_PROVIDER_SITE_OTHER): Payer: Medicare Other | Admitting: Urology

## 2011-08-15 DIAGNOSIS — R339 Retention of urine, unspecified: Secondary | ICD-10-CM

## 2011-08-15 DIAGNOSIS — N478 Other disorders of prepuce: Secondary | ICD-10-CM

## 2011-09-09 ENCOUNTER — Ambulatory Visit (INDEPENDENT_AMBULATORY_CARE_PROVIDER_SITE_OTHER): Payer: Medicare Other | Admitting: Urology

## 2011-09-09 DIAGNOSIS — R339 Retention of urine, unspecified: Secondary | ICD-10-CM

## 2011-09-30 DIAGNOSIS — A0472 Enterocolitis due to Clostridium difficile, not specified as recurrent: Secondary | ICD-10-CM

## 2011-09-30 HISTORY — DX: Enterocolitis due to Clostridium difficile, not specified as recurrent: A04.72

## 2011-10-02 ENCOUNTER — Other Ambulatory Visit: Payer: Self-pay

## 2011-10-02 ENCOUNTER — Emergency Department (HOSPITAL_COMMUNITY): Payer: Medicare Other

## 2011-10-02 ENCOUNTER — Encounter (HOSPITAL_COMMUNITY): Payer: Self-pay | Admitting: Emergency Medicine

## 2011-10-02 ENCOUNTER — Inpatient Hospital Stay (HOSPITAL_COMMUNITY)
Admission: EM | Admit: 2011-10-02 | Discharge: 2011-10-09 | DRG: 371 | Disposition: A | Discharge status: Skilled Nursing Facility | Payer: Medicare Other | Attending: Internal Medicine | Admitting: Internal Medicine

## 2011-10-02 DIAGNOSIS — R05 Cough: Secondary | ICD-10-CM | POA: Diagnosis not present

## 2011-10-02 DIAGNOSIS — R112 Nausea with vomiting, unspecified: Secondary | ICD-10-CM | POA: Diagnosis not present

## 2011-10-02 DIAGNOSIS — G319 Degenerative disease of nervous system, unspecified: Secondary | ICD-10-CM | POA: Diagnosis not present

## 2011-10-02 DIAGNOSIS — E876 Hypokalemia: Secondary | ICD-10-CM | POA: Diagnosis not present

## 2011-10-02 DIAGNOSIS — G92 Toxic encephalopathy: Secondary | ICD-10-CM | POA: Diagnosis present

## 2011-10-02 DIAGNOSIS — E039 Hypothyroidism, unspecified: Secondary | ICD-10-CM | POA: Diagnosis not present

## 2011-10-02 DIAGNOSIS — Z8673 Personal history of transient ischemic attack (TIA), and cerebral infarction without residual deficits: Secondary | ICD-10-CM | POA: Diagnosis not present

## 2011-10-02 DIAGNOSIS — Z5189 Encounter for other specified aftercare: Secondary | ICD-10-CM | POA: Diagnosis not present

## 2011-10-02 DIAGNOSIS — E860 Dehydration: Secondary | ICD-10-CM

## 2011-10-02 DIAGNOSIS — A0472 Enterocolitis due to Clostridium difficile, not specified as recurrent: Principal | ICD-10-CM

## 2011-10-02 DIAGNOSIS — G929 Unspecified toxic encephalopathy: Secondary | ICD-10-CM | POA: Diagnosis present

## 2011-10-02 DIAGNOSIS — R29898 Other symptoms and signs involving the musculoskeletal system: Secondary | ICD-10-CM | POA: Diagnosis not present

## 2011-10-02 DIAGNOSIS — D649 Anemia, unspecified: Secondary | ICD-10-CM | POA: Diagnosis not present

## 2011-10-02 DIAGNOSIS — E870 Hyperosmolality and hypernatremia: Secondary | ICD-10-CM

## 2011-10-02 DIAGNOSIS — J189 Pneumonia, unspecified organism: Secondary | ICD-10-CM | POA: Diagnosis not present

## 2011-10-02 DIAGNOSIS — Z860101 Personal history of adenomatous and serrated colon polyps: Secondary | ICD-10-CM

## 2011-10-02 DIAGNOSIS — J8410 Pulmonary fibrosis, unspecified: Secondary | ICD-10-CM

## 2011-10-02 DIAGNOSIS — K529 Noninfective gastroenteritis and colitis, unspecified: Secondary | ICD-10-CM

## 2011-10-02 DIAGNOSIS — I6529 Occlusion and stenosis of unspecified carotid artery: Secondary | ICD-10-CM | POA: Diagnosis not present

## 2011-10-02 DIAGNOSIS — I6789 Other cerebrovascular disease: Secondary | ICD-10-CM | POA: Diagnosis not present

## 2011-10-02 DIAGNOSIS — G35D Multiple sclerosis, unspecified: Secondary | ICD-10-CM | POA: Diagnosis not present

## 2011-10-02 DIAGNOSIS — I498 Other specified cardiac arrhythmias: Secondary | ICD-10-CM

## 2011-10-02 DIAGNOSIS — I509 Heart failure, unspecified: Secondary | ICD-10-CM | POA: Clinically undetermined

## 2011-10-02 DIAGNOSIS — D539 Nutritional anemia, unspecified: Secondary | ICD-10-CM | POA: Diagnosis present

## 2011-10-02 DIAGNOSIS — R059 Cough, unspecified: Secondary | ICD-10-CM | POA: Diagnosis not present

## 2011-10-02 DIAGNOSIS — Z9359 Other cystostomy status: Secondary | ICD-10-CM

## 2011-10-02 DIAGNOSIS — R1310 Dysphagia, unspecified: Secondary | ICD-10-CM | POA: Diagnosis present

## 2011-10-02 DIAGNOSIS — R259 Unspecified abnormal involuntary movements: Secondary | ICD-10-CM | POA: Diagnosis present

## 2011-10-02 DIAGNOSIS — R911 Solitary pulmonary nodule: Secondary | ICD-10-CM

## 2011-10-02 DIAGNOSIS — I6330 Cerebral infarction due to thrombosis of unspecified cerebral artery: Secondary | ICD-10-CM | POA: Diagnosis not present

## 2011-10-02 DIAGNOSIS — R5383 Other fatigue: Secondary | ICD-10-CM | POA: Diagnosis not present

## 2011-10-02 DIAGNOSIS — G9340 Encephalopathy, unspecified: Secondary | ICD-10-CM | POA: Diagnosis not present

## 2011-10-02 DIAGNOSIS — Z978 Presence of other specified devices: Secondary | ICD-10-CM

## 2011-10-02 DIAGNOSIS — I10 Essential (primary) hypertension: Secondary | ICD-10-CM | POA: Diagnosis not present

## 2011-10-02 DIAGNOSIS — E875 Hyperkalemia: Secondary | ICD-10-CM | POA: Diagnosis present

## 2011-10-02 DIAGNOSIS — R569 Unspecified convulsions: Secondary | ICD-10-CM | POA: Diagnosis not present

## 2011-10-02 DIAGNOSIS — N390 Urinary tract infection, site not specified: Secondary | ICD-10-CM

## 2011-10-02 DIAGNOSIS — Z86010 Personal history of colonic polyps: Secondary | ICD-10-CM

## 2011-10-02 DIAGNOSIS — E782 Mixed hyperlipidemia: Secondary | ICD-10-CM | POA: Diagnosis not present

## 2011-10-02 DIAGNOSIS — G35 Multiple sclerosis: Secondary | ICD-10-CM | POA: Diagnosis present

## 2011-10-02 DIAGNOSIS — R251 Tremor, unspecified: Secondary | ICD-10-CM

## 2011-10-02 DIAGNOSIS — J159 Unspecified bacterial pneumonia: Secondary | ICD-10-CM | POA: Diagnosis not present

## 2011-10-02 DIAGNOSIS — I319 Disease of pericardium, unspecified: Secondary | ICD-10-CM | POA: Diagnosis not present

## 2011-10-02 DIAGNOSIS — R197 Diarrhea, unspecified: Secondary | ICD-10-CM | POA: Diagnosis not present

## 2011-10-02 DIAGNOSIS — J984 Other disorders of lung: Secondary | ICD-10-CM | POA: Diagnosis not present

## 2011-10-02 HISTORY — DX: Hypothyroidism, unspecified: E03.9

## 2011-10-02 HISTORY — DX: Solitary pulmonary nodule: R91.1

## 2011-10-02 HISTORY — DX: Essential (primary) hypertension: I10

## 2011-10-02 HISTORY — DX: Tremor, unspecified: R25.1

## 2011-10-02 HISTORY — DX: Pulmonary fibrosis, unspecified: J84.10

## 2011-10-02 LAB — URINALYSIS, ROUTINE W REFLEX MICROSCOPIC
Glucose, UA: NEGATIVE mg/dL
Specific Gravity, Urine: 1.01 (ref 1.005–1.030)
pH: 6.5 (ref 5.0–8.0)

## 2011-10-02 LAB — HEPATIC FUNCTION PANEL
ALT: 28 U/L (ref 0–53)
Total Protein: 7.5 g/dL (ref 6.0–8.3)

## 2011-10-02 LAB — BASIC METABOLIC PANEL
BUN: 20 mg/dL (ref 6–23)
Chloride: 99 mEq/L (ref 96–112)
Creatinine, Ser: 1.19 mg/dL (ref 0.50–1.35)
GFR calc Af Amer: 71 mL/min — ABNORMAL LOW (ref >90)
GFR calc non Af Amer: 61 mL/min — ABNORMAL LOW (ref >90)

## 2011-10-02 LAB — URINE MICROSCOPIC-ADD ON

## 2011-10-02 LAB — CLOSTRIDIUM DIFFICILE BY PCR: Toxigenic C. Difficile by PCR: POSITIVE — AB

## 2011-10-02 LAB — CARDIAC PANEL(CRET KIN+CKTOT+MB+TROPI)
CK, MB: 4.4 ng/mL — ABNORMAL HIGH (ref 0.3–4.0)
Total CK: 572 U/L — ABNORMAL HIGH (ref 7–232)
Troponin I: <0.3 ng/mL (ref <0.30)

## 2011-10-02 LAB — DIFFERENTIAL
Basophils Relative: 0 % (ref 0–1)
Eosinophils Absolute: 0.1 10*3/uL (ref 0.0–0.7)
Eosinophils Relative: 1 % (ref 0–5)
Monocytes Absolute: 0.3 10*3/uL (ref 0.1–1.0)
Monocytes Relative: 5 % (ref 3–12)

## 2011-10-02 LAB — CBC
HCT: 29.4 % — ABNORMAL LOW (ref 39.0–52.0)
Hemoglobin: 10 g/dL — ABNORMAL LOW (ref 13.0–17.0)
MCH: 33.8 pg (ref 26.0–34.0)
MCHC: 34 g/dL (ref 30.0–36.0)
MCV: 99.3 fL (ref 78.0–100.0)

## 2011-10-02 MED ORDER — MOXIFLOXACIN HCL IN NACL 400 MG/250ML IV SOLN
INTRAVENOUS | Status: AC
  Filled 2011-10-02: qty 250

## 2011-10-02 MED ORDER — SODIUM CHLORIDE 0.9 % IV BOLUS (SEPSIS)
1000.0000 mL | Freq: Once | INTRAVENOUS | Status: AC
  Administered 2011-10-02: 1000 mL via INTRAVENOUS

## 2011-10-02 MED ORDER — MORPHINE SULFATE 2 MG/ML IJ SOLN
1.0000 mg | INTRAMUSCULAR | Status: DC | PRN
  Administered 2011-10-03 – 2011-10-05 (×4): 1 mg via INTRAVENOUS
  Filled 2011-10-02 (×4): qty 1

## 2011-10-02 MED ORDER — GUAIFENESIN-DM 100-10 MG/5ML PO SYRP: 5.0000 mL | ORAL_SOLUTION | ORAL | Status: DC | PRN

## 2011-10-02 MED ORDER — ONDANSETRON HCL 4 MG PO TABS: 4.0000 mg | ORAL_TABLET | Freq: Four times a day (QID) | ORAL | Status: DC | PRN

## 2011-10-02 MED ORDER — POTASSIUM CHLORIDE IN NACL 40-0.9 MEQ/L-% IV SOLN
INTRAVENOUS | Status: AC
  Administered 2011-10-02: 23:00:00 via INTRAVENOUS
  Filled 2011-10-02: qty 1000

## 2011-10-02 MED ORDER — CARBAMAZEPINE 200 MG PO TABS
200.0000 mg | ORAL_TABLET | Freq: Two times a day (BID) | ORAL | Status: DC
  Administered 2011-10-02 – 2011-10-09 (×14): 200 mg via ORAL
  Filled 2011-10-02 (×15): qty 1

## 2011-10-02 MED ORDER — SODIUM CHLORIDE 0.9 % IV SOLN: INTRAVENOUS | Status: DC

## 2011-10-02 MED ORDER — MOXIFLOXACIN HCL IN NACL 400 MG/250ML IV SOLN
400.0000 mg | INTRAVENOUS | Status: DC
  Administered 2011-10-02 – 2011-10-05 (×4): 400 mg via INTRAVENOUS
  Filled 2011-10-02 (×4): qty 250

## 2011-10-02 MED ORDER — POTASSIUM CHLORIDE 10 MEQ/100ML IV SOLN
10.0000 meq | INTRAVENOUS | Status: AC
  Administered 2011-10-02 (×2): 10 meq via INTRAVENOUS
  Filled 2011-10-02 (×2): qty 100

## 2011-10-02 MED ORDER — SODIUM CHLORIDE 0.9 % IV SOLN
Freq: Once | INTRAVENOUS | Status: AC
  Administered 2011-10-02: 12:00:00 via INTRAVENOUS

## 2011-10-02 MED ORDER — HYDROCODONE-ACETAMINOPHEN 7.5-500 MG/15ML PO SOLN
15.0000 mL | ORAL | Status: DC | PRN
  Administered 2011-10-02 – 2011-10-08 (×3): 15 mL via ORAL
  Filled 2011-10-02 (×3): qty 15

## 2011-10-02 MED ORDER — VERAPAMIL HCL ER 180 MG PO TBCR
180.0000 mg | EXTENDED_RELEASE_TABLET | Freq: Two times a day (BID) | ORAL | Status: DC
  Administered 2011-10-02 – 2011-10-09 (×13): 180 mg via ORAL
  Filled 2011-10-02 (×14): qty 1

## 2011-10-02 MED ORDER — DIAZEPAM 5 MG PO TABS
5.0000 mg | ORAL_TABLET | Freq: Two times a day (BID) | ORAL | Status: DC
  Administered 2011-10-02 – 2011-10-09 (×14): 5 mg via ORAL
  Filled 2011-10-02 (×15): qty 1

## 2011-10-02 MED ORDER — ASPIRIN EC 81 MG PO TBEC
81.0000 mg | DELAYED_RELEASE_TABLET | Freq: Every day | ORAL | Status: DC
  Administered 2011-10-03 – 2011-10-09 (×7): 81 mg via ORAL
  Filled 2011-10-02 (×7): qty 1

## 2011-10-02 MED ORDER — ONDANSETRON HCL 4 MG/2ML IJ SOLN: 4.0000 mg | Freq: Four times a day (QID) | INTRAMUSCULAR | Status: DC | PRN

## 2011-10-02 MED ORDER — ALBUTEROL SULFATE (5 MG/ML) 0.5% IN NEBU: 2.5000 mg | INHALATION_SOLUTION | RESPIRATORY_TRACT | Status: DC | PRN

## 2011-10-02 MED ORDER — POTASSIUM CHLORIDE 20 MEQ/15ML (10%) PO LIQD
40.0000 meq | Freq: Two times a day (BID) | ORAL | Status: AC
  Administered 2011-10-02: 40 meq via ORAL
  Filled 2011-10-02 (×2): qty 30

## 2011-10-02 MED ORDER — POTASSIUM CHLORIDE IN NACL 40-0.9 MEQ/L-% IV SOLN
INTRAVENOUS | Status: AC
  Filled 2011-10-02: qty 1000

## 2011-10-02 MED ORDER — CLONIDINE HCL 0.2 MG PO TABS
0.2000 mg | ORAL_TABLET | Freq: Two times a day (BID) | ORAL | Status: DC
  Administered 2011-10-02 – 2011-10-07 (×10): 0.2 mg via ORAL
  Filled 2011-10-02 (×13): qty 1

## 2011-10-02 NOTE — ED Notes (Signed)
Patient incontinent of stool. Patient cleansed, new depends placed on patient. Excoriation noted to sacral and coccyx area, blanchable. Abrasion/sore noted to penis around the bottom of the meatus, appears scabbed over. Foley catheter in place PTA to ED.

## 2011-10-02 NOTE — H&P (Signed)
PCP:   Cory Munch, PA, PA-C   Chief Complaint:  Generalized weakness and worsening diarrhea over the last several days  HPI: 71 year old male with a history of multiple sclerosis and chronic debilitating state who was recently diagnosed with upper respiratory tract infection and placed on a Z-Pak. He chronically has diarrhea which is well controlled with medications however he is on is fourth day of azithromycin and his diarrhea has markedly gotten worse over the last 48 hours. He denies any fevers or chest pain or abdominal pain. Denies any blood in his diarrhea. He does have a chronic indwelling Foley catheter the last several months due to complications from his MS. Denies any other focal neurological deficits however is very quite weak generally and has been having more difficulty in walking. He normally ambulates with a cane and has frequent falls due to his multiple sclerosis. He lives with his wife and they did not have any home health services available right now. Denies any slurred speech or facial droop. He has had a problem with low potassium once in the past but does not have a chronic problem with this. He also has not been eating or drinking very well over the last several days. He did not get the flu vaccine this year.  Review of Systems:  Otherwise negative  Past Medical History: Past Medical History  Diagnosis Date  . Arthritis   . MS (multiple sclerosis)   . Insomnia   . HTN (hypertension)   . PVD (peripheral vascular disease)   . Carotid artery stenosis   . TIA (transient ischemic attack)   . Kidney stones    Past Surgical History  Procedure Date  . Cholecystectomy   . Inguinal hernia repair 1971    bilateral  . Back surgery 1976/1979  . Colonoscopy 11/2004    Dr. Sharol Roussel sessile polyp splenic flexure, 61mm sessile polyp desc colon, tubulovillous adenoma (bx not removed)  . Colonoscopy 01/2005    poor prep, polyp could not be found  . Colonoscopy  05/2005    with EMR, polypectomy Dr. Olegario Messier, bx showed high grade dysplasia, partially resected  . Colonoscopy 09/2005    Dr. Arsenio Loader, [place] ink tattooing, four villous colon polyp (3 had been missed on previous colonoscopies due to limitations of procedures  . Colon surgery 09/2005    four tubular adenomas, no cancer  . Appendectomy 09/2005    at time of left hemicolectomy  . Colonoscopy 09/2006    normal TI, no polyps  . Colonoscopy 10/2007    Dr. Imogene Burn distal mammillations, benign bx, normal TI, random bx neg for microscopic colitis    Medications: Prior to Admission medications   Medication Sig Start Date End Date Taking? Authorizing Provider  aspirin EC 81 MG tablet Take 81 mg by mouth daily.     Yes Historical Provider, MD  azithromycin (ZITHROMAX Z-PAK) 250 MG tablet Take 250 mg by mouth daily.     Yes Historical Provider, MD  azithromycin (ZITHROMAX) 500 MG tablet Take 500 mg by mouth daily.     Yes Historical Provider, MD  cloNIDine (CATAPRES) 0.2 MG tablet 1 tablet 2 (two) times daily.  06/11/11  Yes Historical Provider, MD  diazepam (VALIUM) 5 MG tablet 1 tablet 2 (two) times daily.  06/11/11  Yes Historical Provider, MD  hydrochlorothiazide (HYDRODIURIL) 25 MG tablet 1 tablet daily.  06/11/11  Yes Historical Provider, MD  hydrocodone-acetaminophen (LORCET PLUS) 7.5-650 MG per tablet 1 tablet every 6 (six) hours as needed.  pain 06/11/11  Yes Historical Provider, MD  QUESTRAN 4 G packet DRINK 1 PACK (4 GRAMS) MIXED IN 8 OUNCES OF WATER/JUICE 1- 2 TIMES A DAY AS NEEDED FOR DIARRHEA.DO NOT TAKE WITHIN 2 HOURS OF OTHER MEDICATI 08/11/11  Yes Vickey Huger, NP  verapamil (CALAN-SR) 180 MG CR tablet 1 tablet 2 (two) times daily.  06/11/11  Yes Historical Provider, MD  TEGRETOL 200 MG tablet 1 tablet 2 (two) times daily.  06/11/11   Historical Provider, MD    Allergies:   Allergies  Allergen Reactions  . Tetracyclines & Related Rash    Social History:  reports that he has  quit smoking. His smoking use included Cigarettes. He does not have any smokeless tobacco history on file. He reports that he does not drink alcohol or use illicit drugs.  Family History: Family History  Problem Relation Age of Onset  . Colon cancer Neg Hx   . Cirrhosis Brother     etoh  . Stroke Mother 65  . Coronary artery disease Father 31  . Heart attack Brother     Physical Exam: Filed Vitals:   10/02/11 1600 10/02/11 1700 10/02/11 1800 10/02/11 1900  BP: 126/67 132/78 133/82 133/70  Pulse: 61     Temp:      TempSrc:      Resp: 15 17 18 16   Weight:      SpO2: 93%      BP 133/70  Pulse 61  Temp(Src) 97.6 F (36.4 C) (Oral)  Resp 16  Wt 68.493 kg (151 lb)  SpO2 93% General appearance: alert, cooperative and no distress chronically ill-appearing Lungs: rhonchi bibasilar Heart: regular rate and rhythm, S1, S2 normal, no murmur, click, rub or gallop Abdomen: soft, non-tender; bowel sounds normal; no masses,  no organomegaly Extremities: extremities normal, atraumatic, no cyanosis or edema with decreased muscle tone in his lower extremities Pulses: 2+ and symmetric Skin: Skin color, texture, turgor normal. No rashes or lesions Neurologic: Grossly normal    Labs on Admission:   [facility] 10/02/11 1118  NA 141  K 2.3*  CL 99  CO2 33*  GLUCOSE 101*  BUN 20  CREATININE 1.19  CALCIUM 9.9  MG --  PHOS --    Basename 10/02/11 1118  WBC 7.3  NEUTROABS 5.8  HGB 10.0*  HCT 29.4*  MCV 99.3  PLT 168     Radiological Exams on Admission: Ct Head Wo Contrast  10/02/2011  *RADIOLOGY REPORT*  Clinical Data: Multiple sclerosis and extremity weakness.  CT HEAD WITHOUT CONTRAST  Technique:  Contiguous axial images were obtained from the base of the skull through the vertex without contrast.  Comparison: MRI the brain dated 12/13/2010.  Findings: Advanced small vessel disease again noted with multiple white matter infarcts in the periventricular white matter and bilateral  basal ganglia. The brain demonstrates no evidence of hemorrhage, acute infarction, edema, mass effect, extra-axial fluid collection, hydrocephalus or mass lesion.  The skull is unremarkable.  IMPRESSION: Advanced small vessel disease and old white matter infarcts.  No acute findings.  Original Report Authenticated By: Azzie Roup, M.D.   Dg Chest Port 1 View  10/02/2011  *RADIOLOGY REPORT*  Clinical Data: Cough and hypertension.  PORTABLE CHEST - 1 VIEW  Comparison: 06/18/2005  Findings: Coarse parenchymal lung disease present bilaterally, predominantly in a peripheral distribution.  Findings are suggestive of fibrotic lung disease.  Components of acute infiltrates cannot be excluded especially in the right lung.  No edema or pleural effusion identified.  Heart size and mediastinal contours are within normal limits.  IMPRESSION: Progressive coarse parenchymal disease bilaterally which is suggestive of potential progressive pulmonary fibrosis.  Component of acute infiltrate would be difficult to exclude, especially in the right lung.  Original Report Authenticated By: Azzie Roup, M.D.    Assessment/Plan Present on Admission:  71 year old male with multiple sclerosis and generalized weakness likely secondary to dehydration and hypokalemia from exacerbation of his diarrhea do to recent antibiotics  .Diarrhea this is likely due to the Z-Pak but we'll check stool culture and C. difficile as he is certainly high risk for further infections.  .Hypokalemia he is received IV potassium in the emergency department we'll place him in his IV fluids overnight and also oral replacement and recheck in the morning.  .Dehydration IV fluids.  Marland KitchenPNA (pneumonia) probable we'll place him on Avelox IV.  Marland KitchenJunctional rhythm this is likely due to his electrolyte abnormalities we'll also check a magnesium level. And recheck his EKG in the morning after replacing his potassium.  .Multiple sclerosis stable do not think  this is a multiple sclerosis exasperation at this time. We'll obtain physical therapy evaluation family may need home health services at discharge.   Sarah Zerby A [id number] 10/02/2011, 8:28 PM

## 2011-10-02 NOTE — ED Notes (Signed)
Patient given a Coke.  Alert and resting comfortably.

## 2011-10-02 NOTE — ED Notes (Signed)
Patient arrives via EMS with c/o right sided weakness that started New Year's Eve. Patient reporting that he was having difficulty grabbing things and with daily activities. Patient alert/oriented x 4. No facial droop noted, grips strong bilaterally. Patient does not cooperate with plantar and dorsal flexion, unable to assess well. Speech clear.

## 2011-10-02 NOTE — ED Provider Notes (Signed)
History     CSN: [id number]  Arrival date & time 10/02/11  7   First MD Initiated Contact with Patient 10/02/11 1340      Chief Complaint  Patient presents with  . Extremity Weakness    Patient is a 71 y.o. male presenting with extremity weakness. The history is provided by the patient and a relative.  Extremity Weakness This is a new problem. The current episode started more than 2 days ago. The problem occurs constantly. The problem has been gradually worsening. Associated symptoms include headaches and shortness of breath. The symptoms are aggravated by nothing. The symptoms are relieved by nothing.  Pt presents from home It is reported that he began to have right sided weaknes in his body over 6 days ago It is not improving He has h/o MS, so at times he will have focal motor weakness He and his family called his neurologist and he was instructed to go to the ED He reports recent diarrhea and he feels generalized fatigue He saw his PCP for a cough recently and started on zpack, no other new meds recently Past Medical History  Diagnosis Date  . Arthritis   . MS (multiple sclerosis)   . Insomnia   . HTN (hypertension)   . PVD (peripheral vascular disease)   . Carotid artery stenosis   . TIA (transient ischemic attack)   . Kidney stones     Past Surgical History  Procedure Date  . Cholecystectomy   . Inguinal hernia repair 1971    bilateral  . Back surgery 1976/1979  . Colonoscopy 11/2004    Dr. Sharol Roussel sessile polyp splenic flexure, 75mm sessile polyp desc colon, tubulovillous adenoma (bx not removed)  . Colonoscopy 01/2005    poor prep, polyp could not be found  . Colonoscopy 05/2005    with EMR, polypectomy Dr. Olegario Messier, bx showed high grade dysplasia, partially resected  . Colonoscopy 09/2005    Dr. Arsenio Loader, [place] ink tattooing, four villous colon polyp (3 had been missed on previous colonoscopies due to limitations of procedures  . Colon surgery 09/2005      four tubular adenomas, no cancer  . Appendectomy 09/2005    at time of left hemicolectomy  . Colonoscopy 09/2006    normal TI, no polyps  . Colonoscopy 10/2007    Dr. Imogene Burn distal mammillations, benign bx, normal TI, random bx neg for microscopic colitis    Family History  Problem Relation Age of Onset  . Colon cancer Neg Hx   . Cirrhosis Brother     etoh  . Stroke Mother 73  . Coronary artery disease Father 59  . Heart attack Brother     History  Substance Use Topics  . Smoking status: Former Smoker    Types: Cigarettes  . Smokeless tobacco: Not on file   Comment: quit remote  . Alcohol Use: No      Review of Systems  Respiratory: Positive for shortness of breath.   Musculoskeletal: Positive for extremity weakness.  Neurological: Positive for headaches.  All other systems reviewed and are negative.    Allergies  Tetracyclines & related  Home Medications   Current Outpatient Rx  Name Route Sig Dispense Refill  . ASPIRIN EC 81 MG PO TBEC Oral Take 81 mg by mouth daily.      . AZITHROMYCIN 250 MG PO TABS Oral Take 250 mg by mouth daily.      . AZITHROMYCIN 500 MG PO TABS Oral Take 500 mg  by mouth daily.      Marland Kitchen CLONIDINE HCL 0.2 MG PO TABS  1 tablet 2 (two) times daily.     Marland Kitchen DIAZEPAM 5 MG PO TABS  1 tablet 2 (two) times daily.     Marland Kitchen HYDROCHLOROTHIAZIDE 25 MG PO TABS  1 tablet daily.     Marland Kitchen HYDROCODONE-ACETAMINOPHEN 7.5-650 MG PO TABS  1 tablet every 6 (six) hours as needed. pain    . QUESTRAN 4 G PO PACK  DRINK 1 PACK (4 GRAMS) MIXED IN 8 OUNCES OF WATER/JUICE 1- 2 TIMES A DAY AS NEEDED FOR DIARRHEA.DO NOT TAKE WITHIN 2 HOURS OF OTHER MEDICATI 60 each 5  . VERAPAMIL HCL ER 180 MG PO TBCR  1 tablet 2 (two) times daily.     . TEGRETOL 200 MG PO TABS  1 tablet 2 (two) times daily.       BP 90/58  Pulse 61  Temp(Src) 97.6 F (36.4 C) (Oral)  Resp 16  Wt 151 lb (68.493 kg)  SpO2 100% BP 126/67  Pulse 61  Temp(Src) 97.6 F (36.4 C) (Oral)  Resp 15   Wt 151 lb (68.493 kg)  SpO2 93%   Physical Exam CONSTITUTIONAL: Well developed/well nourished HEAD AND FACE: Normocephalic/atraumatic EYES: EOMI/PERRL ENMT: Mucous membranes dry NECK: supple no meningeal signs SPINE:entire spine nontender CV: no murmurs/rubs/gallops noted LUNGS: Lungs are clear to auscultation bilaterally, no apparent distress ABDOMEN: soft, nontender, no rebound or guarding GU:no cva tenderness, foley in place on arrival to the ED, no blood/trauma noted to penis NEURO: Pt is awake/alert, no arm/ drift noted, no facial droop noted but pt has minimal motor effort in either of his lower extremities EXTREMITIES: pulses normal, full ROM SKIN: warm, color normal PSYCH: no abnormalities of mood noted  ED Course  Procedures  Labs Reviewed  CBC - Abnormal; Notable for the following:    RBC 2.96 (*) CORRECTED FOR COLD AGGLUTININS   Hemoglobin 10.0 (*) CORRECTED FOR COLD AGGLUTININS   HCT 29.4 (*) CORRECTED FOR COLD AGGLUTININS   All other components within normal limits  DIFFERENTIAL - Abnormal; Notable for the following:    Neutrophils Relative 80 (*)    All other components within normal limits  BASIC METABOLIC PANEL - Abnormal; Notable for the following:    Potassium 2.3 (*)    CO2 33 (*)    Glucose, Bld 101 (*)    GFR calc non Af Amer 61 (*)    GFR calc Af Amer 71 (*)    All other components within normal limits     MDM  Nursing notes reviewed and considered in documentation All labs/vitals reviewed and considered Previous records reviewed and considered xrays reviewed and considered    tPA in stroke considered but not given due to:  Onset over 3-4.5hours  2:16 PM hypoK noted, EKG changes noted, KCL already infusing per nursing, continue IV fluids while awaiting head/chest imaging  4:42 PM Vitals improved D/w dr [place] Pink, [facility] will admit Pt stable at this time, he has received IV KCL, but has EKG changes     Date: 10/02/2011  Rate: 66   Rhythm: indeterminate  QRS Axis: normal  Intervals: QT prolonged  ST/T Wave abnormalities: nonspecific ST changes  Conduction Disutrbances:none  Narrative Interpretation:   Old EKG Reviewed: none available at time of interpretation    Sharyon Cable, MD 10/02/11 1643

## 2011-10-02 NOTE — ED Notes (Signed)
Report given to [facility], RN [address]. Ready to receive patient.

## 2011-10-02 NOTE — ED Notes (Signed)
CRITICAL VALUE ALERT  Critical value received:  Potassium  Date of notification:  10/02/11  Time of notification:  1205  Critical value read back:yes  Nurse who received alert:  Jeanice Lim  MD notified (1st page):  Dr Roderic Palau   Time of first page:  1205  Verbal order for 2 runs of 10 meq potassium chloride/132mls.

## 2011-10-02 NOTE — ED Notes (Signed)
Attempted to call report. RN to call back. 

## 2011-10-02 NOTE — ED Notes (Signed)
Patient had loose bowel movement. Cleaned patient and repositioned to patient's comfort.

## 2011-10-03 ENCOUNTER — Observation Stay (HOSPITAL_COMMUNITY): Payer: Medicare Other

## 2011-10-03 DIAGNOSIS — G319 Degenerative disease of nervous system, unspecified: Secondary | ICD-10-CM | POA: Diagnosis not present

## 2011-10-03 DIAGNOSIS — N390 Urinary tract infection, site not specified: Secondary | ICD-10-CM | POA: Diagnosis present

## 2011-10-03 DIAGNOSIS — R5381 Other malaise: Secondary | ICD-10-CM | POA: Diagnosis not present

## 2011-10-03 LAB — CBC
HCT: 25.9 % — ABNORMAL LOW (ref 39.0–52.0)
Hemoglobin: 8.6 g/dL — ABNORMAL LOW (ref 13.0–17.0)
RBC: 2.61 MIL/uL — ABNORMAL LOW (ref 4.22–5.81)
WBC: 8.7 10*3/uL (ref 4.0–10.5)

## 2011-10-03 LAB — HEMOGLOBIN AND HEMATOCRIT, BLOOD: HCT: 16.1 % — ABNORMAL LOW (ref 39.0–52.0)

## 2011-10-03 LAB — BASIC METABOLIC PANEL
Chloride: 106 mEq/L (ref 96–112)
GFR calc Af Amer: 77 mL/min — ABNORMAL LOW (ref >90)
GFR calc non Af Amer: 66 mL/min — ABNORMAL LOW (ref >90)
Potassium: 2.9 mEq/L — ABNORMAL LOW (ref 3.5–5.1)
Sodium: 141 mEq/L (ref 135–145)

## 2011-10-03 LAB — CARDIAC PANEL(CRET KIN+CKTOT+MB+TROPI)
CK, MB: 7.6 ng/mL (ref 0.3–4.0)
CK, MB: 8.2 ng/mL (ref 0.3–4.0)

## 2011-10-03 MED ORDER — POTASSIUM CHLORIDE 20 MEQ/15ML (10%) PO LIQD
40.0000 meq | Freq: Once | ORAL | Status: AC
  Administered 2011-10-03: 40 meq via ORAL

## 2011-10-03 MED ORDER — POTASSIUM CHLORIDE IN NACL 20-0.9 MEQ/L-% IV SOLN
INTRAVENOUS | Status: DC
  Administered 2011-10-03 – 2011-10-05 (×3): via INTRAVENOUS

## 2011-10-03 MED ORDER — GADOBENATE DIMEGLUMINE 529 MG/ML IV SOLN
15.0000 mL | Freq: Once | INTRAVENOUS | Status: AC | PRN
  Administered 2011-10-03: 15 mL via INTRAVENOUS

## 2011-10-03 MED ORDER — METRONIDAZOLE 500 MG PO TABS
500.0000 mg | ORAL_TABLET | Freq: Three times a day (TID) | ORAL | Status: DC
  Administered 2011-10-03 – 2011-10-09 (×18): 500 mg via ORAL
  Filled 2011-10-03 (×19): qty 1

## 2011-10-03 MED ORDER — DEXTROSE 5 % IV SOLN
1.0000 g | INTRAVENOUS | Status: DC
  Administered 2011-10-03 – 2011-10-08 (×6): 1 g via INTRAVENOUS
  Filled 2011-10-03 (×7): qty 10

## 2011-10-03 MED ORDER — DEXTROSE 5 % IV SOLN
INTRAVENOUS | Status: AC
  Filled 2011-10-03: qty 10

## 2011-10-03 MED ORDER — POTASSIUM CHLORIDE 20 MEQ/15ML (10%) PO LIQD
40.0000 meq | Freq: Once | ORAL | Status: AC
  Administered 2011-10-03: 40 meq via ORAL
  Filled 2011-10-03: qty 30

## 2011-10-03 NOTE — Progress Notes (Signed)
Physical Therapy Evaluation Patient Details Name: Jordan Ward MRN: [id number] DOB: 05-31-1941 Today's Date: 10/03/2011  Problem List:  Patient Active Problem List  Diagnoses  . Chronic diarrhea  . Hx of adenomatous colonic polyps  . Anemia  . Diarrhea  . Hypokalemia  . Dehydration  . PNA (pneumonia) probable  . Junctional rhythm  . Multiple sclerosis    Past Medical History:  Past Medical History  Diagnosis Date  . Arthritis   . MS (multiple sclerosis)   . Insomnia   . HTN (hypertension)   . PVD (peripheral vascular disease)   . Carotid artery stenosis   . TIA (transient ischemic attack)   . Kidney stones    Past Surgical History:  Past Surgical History  Procedure Date  . Cholecystectomy   . Inguinal hernia repair 1971    bilateral  . Back surgery 1976/1979  . Colonoscopy 11/2004    Dr. Sharol Roussel sessile polyp splenic flexure, 82mm sessile polyp desc colon, tubulovillous adenoma (bx not removed)  . Colonoscopy 01/2005    poor prep, polyp could not be found  . Colonoscopy 05/2005    with EMR, polypectomy Dr. Olegario Messier, bx showed high grade dysplasia, partially resected  . Colonoscopy 09/2005    Dr. Arsenio Loader, [place] ink tattooing, four villous colon polyp (3 had been missed on previous colonoscopies due to limitations of procedures  . Colon surgery 09/2005    four tubular adenomas, no cancer  . Appendectomy 09/2005    at time of left hemicolectomy  . Colonoscopy 09/2006    normal TI, no polyps  . Colonoscopy 10/2007    Dr. Imogene Burn distal mammillations, benign bx, normal TI, random bx neg for microscopic colitis    PT Assessment/Plan/Recommendation PT Assessment Clinical Impression Statement: attempt at eval done today with limited success...pt very lethargic with slow response to any questions...max assist to roll side to side in bed...unable to sit pt at EOB with max/total assis...he acts  as if he was a quadraplegic.Marland KitchenMarland KitchenI am not sure if this is simply  fatigue, disinterest or  an exacerbation of MS...his prior functional level is not clear to me, but it is questionable that his wife can care for him at d/c.Marland KitchenMarland Kitchenwe will cont to assess PT Recommendation/Assessment: Patient will need skilled PT in the acute care venue PT Problem List: Decreased strength;Decreased activity tolerance;Decreased balance;Decreased mobility;Decreased safety awareness Barriers to Discharge: None PT Therapy Diagnosis : Difficulty walking;Generalized weakness PT Plan PT Frequency: Min 3X/week PT Treatment/Interventions: Therapeutic activities;Functional mobility training;Therapeutic exercise;Balance training;Neuromuscular re-education;Patient/family education PT Recommendation Recommendations for Other Services: OT consult Follow Up Recommendations:  (to be determined) Equipment Recommended: Defer to next venue PT Goals  Acute Rehab PT Goals PT Goal Formulation: With patient Time For Goal Achievement: 2 weeks Pt will Roll Supine to Right Side: with mod assist Pt will Roll Supine to Left Side: with mod assist Pt will go Supine/Side to Sit: with mod assist Pt will Sit at Edge of Bed: 1-2 min;with supervision;with bilateral upper extremity support Pt will go Sit to Supine/Side: with min assist Pt will go Sit to Stand: with mod assist Pt will go Stand to Sit: with mod assist Pt will Transfer Bed to Chair/Chair to Bed: with mod assist  PT Evaluation Precautions/Restrictions  Precautions Precautions: Fall Required Braces or Orthoses: No Restrictions Weight Bearing Restrictions: No Prior Functioning  Home Living Lives With: Spouse Receives Help From: Family Type of Home: House Home Layout: One level Home Access: Level entry Home Adaptive Equipment: Straight cane;Walker -  rolling Additional Comments: info listed was given by pt and I am not sure of its' accuracy   Cognition Cognition Arousal/Alertness: Lethargic Overall Cognitive Status: Appears within  functional limits for tasks assessed Cognition - Other Comments: very slow to respond to questions, flat affect, mumbles Sensation/Coordination Sensation Light Touch: Appears Intact Stereognosis: Not tested Hot/Cold: Not tested Proprioception: Not tested Coordination Gross Motor Movements are Fluid and Coordinated: Not tested Fine Motor Movements are Fluid and Coordinated: Not tested Extremity Assessment RUE Assessment RUE Assessment: Within Functional Limits LUE Assessment LUE Assessment: Within Functional Limits RLE Assessment RLE Assessment: Exceptions to [facility] RLE Strength RLE Overall Strength Comments: generally 2/5 LLE Assessment LLE Assessment: Exceptions to [facility] LLE Strength LLE Overall Strength Comments: generally 2+/5 Mobility (including Balance) Bed Mobility Bed Mobility: Yes Rolling Right: 2: Max assist Rolling Left: 2: Max assist Transfers Transfers: No (refuses) Ambulation/Gait Ambulation/Gait: No Stairs: No Wheelchair Mobility Wheelchair Mobility: No  Balance Balance Assessed: No (attempted to sit pt at EOB,but not able to sit with max assi) Exercise    End of Session PT - End of Session Equipment Utilized During Treatment: Gait belt Activity Tolerance: Patient limited by fatigue Patient left: in bed;with call bell in reach;with bed alarm set Nurse Communication: Mobility status for transfers General Behavior During Session: Flat affect Cognition: WFL for tasks performed  Sable Feil 10/03/2011, 3:44 PM

## 2011-10-03 NOTE — Progress Notes (Addendum)
Subjective: Patient feeling very weak. He complains of some shortness of breath and cough. Hard for him to talk. Continue diarrhea.  Objective: Weight change:   Intake/Output Summary (Last 24 hours) at 10/03/11 1932 Last data filed at 10/03/11 1800  Gross per 24 hour  Intake    480 ml  Output   2025 ml  Net  -1545 ml   BP 118/73  Pulse 65  Temp(Src) 97.6 F (36.4 C) (Oral)  Resp 18  Ht 5\' 7"  (1.702 m)  Wt 66.225 kg (146 lb)  BMI 22.87 kg/m2  SpO2 96% General: Fatigue, mild distress secondary to fatigue and breathing, looks older than stated age, alert and oriented x3 HEENT: Normocephalic, atraumatic, mucous membranes are dry Cardiovascular: Regular rate and rhythm, [id number] soft 2/6 systolic ejection murmur Lungs: Scattered rales, mild end expiratory wheezing bilaterally Abdomen: Soft, non-tender, nondistended, hypoactive bowel sounds Extremities: No clubbing or cyanosis, 1+ pitting edema from the knees down  Lab Results: Basic Metabolic Panel:  Basename 10/03/11 0543 10/02/11 2119 10/02/11 1118  NA 141 -- 141  K 2.9* -- 2.3*  CL 106 -- 99  CO2 27 -- 33*  GLUCOSE 82 -- 101*  BUN 19 -- 20  CREATININE 1.11 -- 1.19  CALCIUM 8.2* -- 9.9  MG -- 2.2 --  PHOS -- -- --   Liver Function Tests:  [facility] 10/02/11 2119  AST 28  ALT 28  ALKPHOS 105  BILITOT 0.4  PROT 7.5  ALBUMIN 3.9   CBC:  Basename 10/03/11 0543 10/02/11 1118  WBC 8.7 7.3  NEUTROABS -- 5.8  HGB 8.6* 10.0*  HCT 25.9* 29.4*  MCV 99.2 99.3  PLT 135* 168   Cardiac Enzymes:  Basename 10/03/11 1532 10/03/11 0543 10/02/11 2119  CKTOTAL 780* 802* 572*  CKMB 8.2* 7.6* 4.4*  CKMBINDEX -- -- --  TROPONINI <0.30 <0.30 <0.30     Micro Results: Recent Results (from the past 240 hour(s))  CLOSTRIDIUM DIFFICILE BY PCR     Status: Abnormal   Collection Time   10/02/11  8:30 PM      Component Value Range Status Comment   C difficile by pcr POSITIVE (*) NEGATIVE  Final     Studies/Results: Ct Head Wo  Contrast 10/02/2011  and IMPRESSION: Advanced small vessel disease and old white matter infarcts.  No acute findings.  Original Report Authenticated By: Azzie Roup, M.D.   Mr Jeri Cos Wo Contrast 10/03/2011  IMPRESSION: Left cerebellar linear area of altered signal intensity (series 3 image 5), question artifact versus small acute infarct  Remote corona radiata/basal ganglia infarcts.  Prominent white matter type changes consistent with the patient's history multiple sclerosis have changed minimally from prior exam.  Global atrophy without hydrocephalus.  Questionable abnormality posterior to the C3 vertebral body causing cord flattening (series 2 image 9).  Dg Chest Port 1 View 10/02/2011  IMPRESSION: Progressive coarse parenchymal disease bilaterally which is suggestive of potential progressive pulmonary fibrosis.  Component of acute infiltrate would be difficult to exclude, especially in the right lung.  Medications: Scheduled Meds:   . aspirin EC  81 mg Oral Daily  . carbamazepine  200 mg Oral BID  . cefTRIAXone (ROCEPHIN)  IV  1 g Intravenous Q24H  . cloNIDine  0.2 mg Oral BID  . diazepam  5 mg Oral BID  . metroNIDAZOLE  500 mg Oral Q8H  . moxifloxacin  400 mg Intravenous Q24H  . potassium chloride  40 mEq Oral BID  . potassium chloride  40 mEq Oral Once  . potassium chloride  40 mEq Oral Once  . verapamil  180 mg Oral BID  . DISCONTD: sodium chloride   Intravenous STAT   Continuous Infusions:   . 0.9 % NaCl with KCl 20 mEq / L 1,000 mL infusion 50 mL/hr at 10/03/11 1621  . 0.9 % NaCl with KCl 40 mEq / L 75 mL/hr at 10/02/11 2237   PRN Meds:.albuterol, gadobenate dimeglumine, guaiFENesin-dextromethorphan, HYDROcodone-acetaminophen, morphine, ondansetron (ZOFRAN) IV, ondansetron  Assessment/Plan: Patient Active Hospital Problem List: C. difficile diarrhea (10/02/2011)  first course. Consider is mild. Started on by mouth Flagyl.  Hypokalemia (10/02/2011)  given multiple doses and  replacing. Over this will improve her C. difficile is better and diarrhea stops.  Dehydration (10/02/2011)  continued IV fluids.  PNA (pneumonia) probable (10/02/2011)  continued oxygen plus IV Avelox plus nebulizers. Have speech therapy evaluate patient for ruling out aspiration.  Junctional rhythm (10/02/2011)  stable at this time.  Multiple sclerosis (10/02/2011)  in the last month, has had a decline. CT noted. In discussion with radiology, the did not feel that he is having acute infarct and the findings were more related to artifact plus his chronic MS.  UTI (lower urinary tract infection) (10/03/2011)  IV Rocephin.  Anemia: Following hemoglobin. Likely initially was hemoconcentrated, better with IV fluids.  LOS: 1 day   Annita Brod 10/03/2011, 7:32 PM

## 2011-10-03 NOTE — Progress Notes (Signed)
UR Chart Review Completed  

## 2011-10-03 NOTE — Progress Notes (Signed)
CRITICAL VALUE ALERT  Critical value received:  CK/MB 7.6  Date of notification:  10/03/11  Time of notification:  0915  Critical value read back:yes  Nurse who received alert:  Ckeatts,rn   MD notified (1st page): S.Krishnan   Time of first page:  0920  MD notified (2nd page):  Time of second page:  Responding MD:  n/a  Time MD responded:  N/a  MD did not respond to page

## 2011-10-04 LAB — CBC
Hemoglobin: 7.7 g/dL — ABNORMAL LOW (ref 13.0–17.0)
MCH: 33.8 pg (ref 26.0–34.0)
MCV: 100.9 fL — ABNORMAL HIGH (ref 78.0–100.0)
Platelets: 144 10*3/uL — ABNORMAL LOW (ref 150–400)
RBC: 2.28 MIL/uL — ABNORMAL LOW (ref 4.22–5.81)

## 2011-10-04 LAB — BASIC METABOLIC PANEL
CO2: 26 mEq/L (ref 19–32)
Calcium: 8.7 mg/dL (ref 8.4–10.5)
Creatinine, Ser: 0.97 mg/dL (ref 0.50–1.35)
Glucose, Bld: 92 mg/dL (ref 70–99)

## 2011-10-04 LAB — ABO/RH: ABO/RH(D): O POS

## 2011-10-04 LAB — PRO B NATRIURETIC PEPTIDE: Pro B Natriuretic peptide (BNP): 580.5 pg/mL — ABNORMAL HIGH (ref 0–125)

## 2011-10-04 MED ORDER — ALBUTEROL SULFATE (5 MG/ML) 0.5% IN NEBU
2.5000 mg | INHALATION_SOLUTION | RESPIRATORY_TRACT | Status: DC
  Administered 2011-10-04 – 2011-10-08 (×19): 2.5 mg via RESPIRATORY_TRACT
  Filled 2011-10-04 (×19): qty 0.5

## 2011-10-04 MED ORDER — LEVALBUTEROL HCL 0.63 MG/3ML IN NEBU: 0.6300 mg | INHALATION_SOLUTION | Freq: Every day | RESPIRATORY_TRACT | Status: DC

## 2011-10-04 MED ORDER — FUROSEMIDE 10 MG/ML IJ SOLN
20.0000 mg | Freq: Once | INTRAMUSCULAR | Status: AC
  Administered 2011-10-04: 20 mg via INTRAVENOUS
  Filled 2011-10-04: qty 2

## 2011-10-04 MED ORDER — POTASSIUM CHLORIDE 20 MEQ/15ML (10%) PO LIQD
40.0000 meq | Freq: Once | ORAL | Status: AC
  Administered 2011-10-04: 40 meq via ORAL
  Filled 2011-10-04: qty 30

## 2011-10-04 MED ORDER — IPRATROPIUM BROMIDE 0.02 % IN SOLN
0.5000 mg | RESPIRATORY_TRACT | Status: DC
  Administered 2011-10-04 – 2011-10-08 (×19): 0.5 mg via RESPIRATORY_TRACT
  Filled 2011-10-04 (×19): qty 2.5

## 2011-10-04 MED ORDER — POTASSIUM CHLORIDE 20 MEQ/15ML (10%) PO LIQD: 40.0000 meq | Freq: Once | ORAL | Status: AC

## 2011-10-04 MED ORDER — LEVALBUTEROL HCL 0.63 MG/3ML IN NEBU: 0.6300 mg | INHALATION_SOLUTION | RESPIRATORY_TRACT | Status: DC | PRN

## 2011-10-04 NOTE — Progress Notes (Signed)
Dr. Shanon Brow made aware  Hgb/Hct, 7.9/16.1

## 2011-10-04 NOTE — Progress Notes (Signed)
Pt received one unit PRBCs; tolerated well; no signs or symptoms of complications or problems; will recheck CBC in am

## 2011-10-04 NOTE — Progress Notes (Signed)
Pt sounds more congested/wet; pt coughs after drinking and easily strangled with fluids; MD notified through text; MD ordered BNP and dose of lasix; will continue to monitor patient

## 2011-10-04 NOTE — Progress Notes (Signed)
Patient ID: Jordan Ward, male   DOB: 1941-04-17, 71 y.o.   MRN: [id number]   Subjective:  No events overnight. Patient reports feeling slightly worse than yesterday in terms of his respiratory status. As per patient's wife she noticed even his mental status slightly deteriorated as he appeared to be confused when she saw him in the morning. Patient is oriented to time, place an person.  Objective:  Vital signs in last 24 hours:  Filed Vitals:   10/03/11 2043 10/03/11 2119 10/04/11 0521 10/04/11 1042  BP:  155/78 105/55   Pulse:  70 61 69  Temp:  97.8 F (36.6 C) 97.2 F (36.2 C)   TempSrc:  Oral Oral   Resp:  20 20 19   Height:      Weight:   68.947 kg (152 lb)   SpO2: 97% 94% 91% 98%    Intake/Output from previous day:   Intake/Output Summary (Last 24 hours) at 10/04/11 1355 Last data filed at 10/04/11 [id number]  Gross per 24 hour  Intake    180 ml  Output    800 ml  Net   -620 ml    Physical Exam: General: Awake, oriented x3, in no acute distress. HEENT: No bruits, no goiter. Somewhat dry  mucous membranes, no scleral icterus, no conjunctival pallor. Heart: Regular rate and rhythm, S1/S2 +, no murmurs, rubs, gallops. Lungs: Bilateral wheezing and crackles appreciated over mid and upper lung lobesAbdomen: Soft, nontender, nondistended, positive bowel sounds. Extremities: No clubbing or cyanosis, no pitting edema,  positive pedal pulses. Neuro: Grossly nonfocal.  Lab Results:  Basic Metabolic Panel:    Component Value Date/Time   NA 141 10/04/2011 0740   K 3.4* 10/04/2011 0740   CL 110 10/04/2011 0740   CO2 26 10/04/2011 0740   BUN 14 10/04/2011 0740   CREATININE 0.97 10/04/2011 0740   GLUCOSE 92 10/04/2011 0740   CALCIUM 8.7 10/04/2011 0740   CBC:    Component Value Date/Time   WBC 7.4 10/04/2011 0740   HGB 7.7* 10/04/2011 0740   HCT 23.0* 10/04/2011 0740   PLT 144* 10/04/2011 0740   MCV 100.9* 10/04/2011 0740   NEUTROABS 5.8 10/02/2011 1118   LYMPHSABS 1.0 10/02/2011 1118   MONOABS  0.3 10/02/2011 1118   EOSABS 0.1 10/02/2011 1118   BASOSABS 0.0 10/02/2011 1118      Lab 10/04/11 0740 10/03/11 1955 10/03/11 0543 10/02/11 1118  WBC 7.4 -- 8.7 7.3  HGB 7.7* 7.9* 8.6* 10.0*  HCT 23.0* 16.1* 25.9* 29.4*  PLT 144* -- 135* 168  MCV 100.9* -- 99.2 99.3  MCH 33.8 -- 33.0 33.8  MCHC 33.5 -- 33.2 34.0  RDW 14.9 -- 14.5 14.2  LYMPHSABS -- -- -- 1.0  MONOABS -- -- -- 0.3  EOSABS -- -- -- 0.1  BASOSABS -- -- -- 0.0  BANDABS -- -- -- --    Lab 10/04/11 0740 10/03/11 0543 10/02/11 2119 10/02/11 1118  NA 141 141 -- 141  K 3.4* 2.9* -- 2.3*  CL 110 106 -- 99  CO2 26 27 -- 33*  GLUCOSE 92 82 -- 101*  BUN 14 19 -- 20  CREATININE 0.97 1.11 -- 1.19  CALCIUM 8.7 8.2* -- 9.9  MG -- -- 2.2 --   No results found for this basename: INR:5,PROTIME:5 in the last 168 hours Cardiac markers:  Lab 10/03/11 1532 10/03/11 0543 10/02/11 2119  CKMB 8.2* 7.6* 4.4*  TROPONINI <0.30 <0.30 <0.30  MYOGLOBIN -- -- --   No  components found with this basename: POCBNP:3 Recent Results (from the past 240 hour(s))  CLOSTRIDIUM DIFFICILE BY PCR     Status: Abnormal   Collection Time   10/02/11  8:30 PM      Component Value Range Status Comment   C difficile by pcr POSITIVE (*) NEGATIVE  Final     Studies/Results: Ct Head Wo Contrast  10/02/2011  *RADIOLOGY REPORT*  Clinical Data: Multiple sclerosis and extremity weakness.  CT HEAD WITHOUT CONTRAST  Technique:  Contiguous axial images were obtained from the base of the skull through the vertex without contrast.  Comparison: MRI the brain dated 12/13/2010.  Findings: Advanced small vessel disease again noted with multiple white matter infarcts in the periventricular white matter and bilateral basal ganglia. The brain demonstrates no evidence of hemorrhage, acute infarction, edema, mass effect, extra-axial fluid collection, hydrocephalus or mass lesion.  The skull is unremarkable.  IMPRESSION: Advanced small vessel disease and old white matter infarcts.   No acute findings.  Original Report Authenticated By: Azzie Roup, M.D.   Mr Jeri Cos [id number] Contrast  10/03/2011  *RADIOLOGY REPORT*  Clinical Data: Weakness.  Multiple falls.  History of multiple sclerosis.  MRI HEAD WITHOUT AND WITH CONTRAST  Technique:  Multiplanar, multiecho pulse sequences of the brain and surrounding structures were obtained according to standard protocol without and with intravenous contrast  Contrast: 56mL MULTIHANCE GADOBENATE DIMEGLUMINE 529 MG/ML IV SOLN  Comparison: 10/02/2011 CT.  12/13/2010 MR.  Findings: Left cerebellar linear area of altered signal intensity (series 3 image 5), question artifact versus small acute infarct  Remote corona radiata/basal ganglia infarcts.  Prominent white matter type changes consistent with the patient's history multiple sclerosis have changed minimally from prior exam.  Small amount of hemosiderin staining associated with remote right corona radiata infarct otherwise no evidence of intracranial hemorrhage.  Global atrophy without hydrocephalus.  Questionable abnormality posterior to the C3 vertebral body causing cord flattening (series 2 image 9).  No intracranial mass or abnormal enhancement noted on this motion degraded exam.  Minimal paranasal sinus mucosal thickening.  Major intracranial vascular structures are patent.  IMPRESSION: Left cerebellar linear area of altered signal intensity (series 3 image 5), question artifact versus small acute infarct  Remote corona radiata/basal ganglia infarcts.  Prominent white matter type changes consistent with the patient's history multiple sclerosis have changed minimally from prior exam.  Global atrophy without hydrocephalus.  Questionable abnormality posterior to the C3 vertebral body causing cord flattening (series 2 image 9).  Original Report Authenticated By: Doug Sou, M.D.   Dg Chest Port 1 View  10/02/2011  *RADIOLOGY REPORT*  Clinical Data: Cough and hypertension.  PORTABLE CHEST - 1 VIEW   Comparison: 06/18/2005  Findings: Coarse parenchymal lung disease present bilaterally, predominantly in a peripheral distribution.  Findings are suggestive of fibrotic lung disease.  Components of acute infiltrates cannot be excluded especially in the right lung.  No edema or pleural effusion identified.  Heart size and mediastinal contours are within normal limits.  IMPRESSION: Progressive coarse parenchymal disease bilaterally which is suggestive of potential progressive pulmonary fibrosis.  Component of acute infiltrate would be difficult to exclude, especially in the right lung.  Original Report Authenticated By: Azzie Roup, M.D.    Medications: Scheduled Meds:   . ipratropium  0.5 mg Nebulization Q4H   And  . albuterol  2.5 mg Nebulization Q4H  . aspirin EC  81 mg Oral Daily  . carbamazepine  200 mg Oral BID  .  cefTRIAXone (ROCEPHIN)  IV  1 g Intravenous Q24H  . cloNIDine  0.2 mg Oral BID  . diazepam  5 mg Oral BID  . metroNIDAZOLE  500 mg Oral Q8H  . moxifloxacin  400 mg Intravenous Q24H  . potassium chloride  40 mEq Oral BID  . potassium chloride  40 mEq Oral Once  . potassium chloride  40 mEq Oral Once  . verapamil  180 mg Oral BID  . DISCONTD: levalbuterol  0.63 mg Nebulization 6 X Daily   Continuous Infusions:   . 0.9 % NaCl with KCl 20 mEq / L 1,000 mL infusion 50 mL/hr at 10/04/11 1206   PRN Meds:.guaiFENesin-dextromethorphan, HYDROcodone-acetaminophen, levalbuterol, morphine, ondansetron (ZOFRAN) IV, ondansetron, DISCONTD: albuterol, DISCONTD: levalbuterol  Assessment/Plan:  Principal Problem:   *C. difficile diarrhea - as per patient's wife patient continue to have diarrhea - continue flagyl 500 mg PO Q 8 hours (started 10/03/2011)  Active Problems:   Hypokalemia - likely secondary to diarrhea - continue to supplement - we will give 1 dose potassium chloride 40 meq   Dehydration - likely secondary to fluid loss due to diarrhea - continue IV fluids    PNA (pneumonia) probable - we will continue avelox 400 mg IV daily - continue nebulizer treatments Q 4 hours scheduled - oxygen support via nasal canula   UTI (lower urinary tract infection) - continue Rocephin IV  Anemia, macrocytic - hemoglobin dropped to 7.7 - we will transfuse 1 Unit of PRBC as patient is slightly hypotensive BP 105/55 - obtain CBC in am   EDUCATION - test results and diagnostic studies were discussed with patient and pt's family who was present at the bedside - patient and family have verbalized the understanding - questions were answered at the bedside and contact information was provided for additional questions or concerns   LOS: 2 days   Austin Herd 10/04/2011, 1:55 PM  [facility] Pager: [phone]

## 2011-10-05 DIAGNOSIS — I509 Heart failure, unspecified: Secondary | ICD-10-CM | POA: Clinically undetermined

## 2011-10-05 LAB — BASIC METABOLIC PANEL
BUN: 14 mg/dL (ref 6–23)
CO2: 28 mEq/L (ref 19–32)
GFR calc non Af Amer: 72 mL/min — ABNORMAL LOW (ref >90)
Glucose, Bld: 96 mg/dL (ref 70–99)
Potassium: 3.7 mEq/L (ref 3.5–5.1)

## 2011-10-05 LAB — CBC
HCT: 28.1 % — ABNORMAL LOW (ref 39.0–52.0)
Hemoglobin: 9.9 g/dL — ABNORMAL LOW (ref 13.0–17.0)
MCHC: 35.2 g/dL (ref 30.0–36.0)

## 2011-10-05 LAB — TYPE AND SCREEN: ABO/RH(D): O POS

## 2011-10-05 NOTE — Progress Notes (Signed)
Patient ID: Jordan Ward, male   DOB: 1941/02/22, 71 y.o.   MRN: [id number] Patient ID: Jordan Ward, male   DOB: 30-Mar-1941, 71 y.o.   MRN: [id number]   Subjective: Patient overall looks to be doing better. He's not had any diarrhea for the last 36 hours. He is breathing much more comfortably. Only complains of cough with deep inspiration. He appears to be slightly more alert. Nursing and myself note that when asked, however, the patient states he still feeling just as bad as ever. He states that he still having diarrhea. He still complains of some shortness of breath.  Objective:  Vital signs in last 24 hours:  Filed Vitals:   10/05/11 0804 10/05/11 1137 10/05/11 1400 10/05/11 1510  BP:   134/78   Pulse:   72   Temp:   97.4 F (36.3 C)   TempSrc:      Resp:   20   Height:      Weight:      SpO2: 100% 96% 98% 93%    Intake/Output from previous day:   Intake/Output Summary (Last 24 hours) at 10/05/11 1520 Last data filed at 10/05/11 1446  Gross per 24 hour  Intake      0 ml  Output   2050 ml  Net  -2050 ml    Physical Exam: General: Awake, oriented x3, in no acute distress. HEENT: No bruits, no goiter. Somewhat dry  mucous membranes, no scleral icterus, no conjunctival pallor. Heart: Regular rate and rhythm, S1/S2 +, no murmurs, rubs, gallops. Lungs: Minimal wheezing. Some bilateral crackles. Decreased at the bases, poor inspiratory effort. Abdomen: Soft, nontender, nondistended, positive bowel sounds. Extremities: No clubbing or cyanosis, no pitting edema,  positive pedal pulses. Neuro: Grossly nonfocal.  Lab Results:     Lab 10/05/11 1143 10/04/11 0740 10/03/11 1955 10/03/11 0543 10/02/11 1118  WBC 9.0 7.4 -- 8.7 7.3  HGB 9.9* 7.7* 7.9* 8.6* 10.0*  HCT 28.1* 23.0* 16.1* 25.9* 29.4*  PLT 170 144* -- 135* 168  MCV 104.5* 100.9* -- 99.2 99.3  MCH 36.8* 33.8 -- 33.0 33.8  MCHC 35.2 33.5 -- 33.2 34.0  RDW 15.7* 14.9 -- 14.5 14.2  LYMPHSABS -- -- -- -- 1.0  MONOABS  -- -- -- -- 0.3  EOSABS -- -- -- -- 0.1  BASOSABS -- -- -- -- 0.0  BANDABS -- -- -- -- --    Lab 10/05/11 1143 10/04/11 0740 10/03/11 0543 10/02/11 2119 10/02/11 1118  NA 144 141 141 -- 141  K 3.7 3.4* 2.9* -- 2.3*  CL 108 110 106 -- 99  CO2 28 26 27  -- 33*  GLUCOSE 96 92 82 -- 101*  BUN 14 14 19  -- 20  CREATININE 1.04 0.97 1.11 -- 1.19  CALCIUM 8.8 8.7 8.2* -- 9.9  MG -- -- -- 2.2 --   Cardiac markers:   Lab 10/03/11 1532 10/03/11 0543 10/02/11 2119  CKMB 8.2* 7.6* 4.4*  TROPONINI <0.30 <0.30 <0.30  MYOGLOBIN -- -- --    Studies/Results: No results found.  Medications: Scheduled Meds:    . ipratropium  0.5 mg Nebulization Q4H   And  . albuterol  2.5 mg Nebulization Q4H  . aspirin EC  81 mg Oral Daily  . carbamazepine  200 mg Oral BID  . cefTRIAXone (ROCEPHIN)  IV  1 g Intravenous Q24H  . cloNIDine  0.2 mg Oral BID  . diazepam  5 mg Oral BID  . furosemide  20 mg Intravenous  Once  . metroNIDAZOLE  500 mg Oral Q8H  . moxifloxacin  400 mg Intravenous Q24H  . potassium chloride  40 mEq Oral Once  . verapamil  180 mg Oral BID   Continuous Infusions:    . 0.9 % NaCl with KCl 20 mEq / L 1,000 mL infusion 50 mL/hr at 10/05/11 1446   PRN Meds:.guaiFENesin-dextromethorphan, HYDROcodone-acetaminophen, levalbuterol, morphine, ondansetron (ZOFRAN) IV, ondansetron  Assessment/Plan:  Principal Problem:   *C. difficile diarrhea Nursing notes no further diarrhea. White blood cell count has resolved. - continue flagyl 500 mg PO Q 8 hours (started 10/03/2011)  Active Problems:  Hypokalemia - likely secondary to diarrhea Replaced. Continue to follow her levels now that he is on Lasix.   Dehydration Better with IV fluids. Will monitor while on Lasix.   PNA (pneumonia) probable - we will continue avelox 400 mg IV daily - continue nebulizer treatments Q 4 hours scheduled - oxygen support via nasal canula, strongly suspicious for aspiration pneumonia. Have consult with  speech therapy to see. In the meantime have made him n.p.o.   UTI (lower urinary tract infection) - continue Rocephin IV  History of multiple sclerosis: Patient still looks to be severely weak. In the past month, his MS has flared to the point where he is unable to move his lower extremities. In addition he has increased weakness because of acute infections and dehydration. Physical therapy see Jordan Ward the patient will need short-term skilled nursing. Neurology will see on Monday.  Anemia, macrocytic - hemoglobin dropped to 7.7 - we will transfuse 1 Unit of PRBC as patient is slightly hypotensive BP 105/55 - obtain CBC in am  Congestive heart failure, acute-unspecified After starting IV fluids, it was reported last night the patient to be increasingly volume overloaded. BNP ordered and came back elevated at 500. Lasix started and patient had significant diuresis of over 2 L. Renal function stable this morning. Echocardiogram has been ordered and is pending. No previous history of CHF.   EDUCATION - test results and diagnostic studies were discussed with patient and pt's family who was present at the bedside - patient and family have verbalized the understanding - questions were answered at the bedside and contact information was provided for additional questions or concerns   LOS: 3 days   Jordan Ward 10/05/2011, 3:20 PM  [facility] Pager: [phone]

## 2011-10-06 ENCOUNTER — Encounter (HOSPITAL_COMMUNITY): Payer: Self-pay | Admitting: Internal Medicine

## 2011-10-06 DIAGNOSIS — Z9359 Other cystostomy status: Secondary | ICD-10-CM

## 2011-10-06 DIAGNOSIS — J8410 Pulmonary fibrosis, unspecified: Secondary | ICD-10-CM

## 2011-10-06 DIAGNOSIS — I10 Essential (primary) hypertension: Secondary | ICD-10-CM | POA: Diagnosis present

## 2011-10-06 DIAGNOSIS — I319 Disease of pericardium, unspecified: Secondary | ICD-10-CM

## 2011-10-06 HISTORY — DX: Essential (primary) hypertension: I10

## 2011-10-06 HISTORY — DX: Pulmonary fibrosis, unspecified: J84.10

## 2011-10-06 LAB — STOOL CULTURE

## 2011-10-06 MED ORDER — HYDROCHLOROTHIAZIDE 12.5 MG PO CAPS
12.5000 mg | ORAL_CAPSULE | Freq: Every day | ORAL | Status: DC
  Administered 2011-10-06 – 2011-10-09 (×4): 12.5 mg via ORAL
  Filled 2011-10-06 (×4): qty 1

## 2011-10-06 MED ORDER — MOXIFLOXACIN HCL 400 MG PO TABS
400.0000 mg | ORAL_TABLET | Freq: Every day | ORAL | Status: DC
  Administered 2011-10-06 – 2011-10-08 (×3): 400 mg via ORAL
  Filled 2011-10-06 (×3): qty 1

## 2011-10-06 MED ORDER — KCL IN DEXTROSE-NACL 20-5-0.9 MEQ/L-%-% IV SOLN
INTRAVENOUS | Status: DC
  Administered 2011-10-06: 65 mL/h via INTRAVENOUS
  Administered 2011-10-07: 08:00:00 via INTRAVENOUS

## 2011-10-06 MED ORDER — POTASSIUM CHLORIDE CRYS ER 20 MEQ PO TBCR
20.0000 meq | EXTENDED_RELEASE_TABLET | Freq: Two times a day (BID) | ORAL | Status: DC
  Administered 2011-10-06 – 2011-10-09 (×7): 20 meq via ORAL
  Filled 2011-10-06 (×7): qty 1

## 2011-10-06 NOTE — Consult Note (Signed)
Reason for Consult: Referring Physician:   [name] is an 71 y.o. male.  HPI:   Past Medical History  Diagnosis Date  . Arthritis   . MS (multiple sclerosis)   . Insomnia   . HTN (hypertension)   . PVD (peripheral vascular disease)   . Carotid artery stenosis   . TIA (transient ischemic attack)   . Kidney stones   . HTN (hypertension), malignant 10/06/2011  . Chronic indwelling foley catheter 10/06/2011  . Pulmonary fibrosis 10/06/2011    Past Surgical History  Procedure Date  . Cholecystectomy   . Inguinal hernia repair 1971    bilateral  . Back surgery 1976/1979  . Colonoscopy 11/2004    Dr. Sharol Roussel sessile polyp splenic flexure, 90mm sessile polyp desc colon, tubulovillous adenoma (bx not removed)  . Colonoscopy 01/2005    poor prep, polyp could not be found  . Colonoscopy 05/2005    with EMR, polypectomy Dr. Olegario Messier, bx showed high grade dysplasia, partially resected  . Colonoscopy 09/2005    Dr. Arsenio Loader, [place] ink tattooing, four villous colon polyp (3 had been missed on previous colonoscopies due to limitations of procedures  . Colon surgery 09/2005    four tubular adenomas, no cancer  . Appendectomy 09/2005    at time of left hemicolectomy  . Colonoscopy 09/2006    normal TI, no polyps  . Colonoscopy 10/2007    Dr. Imogene Burn distal mammillations, benign bx, normal TI, random bx neg for microscopic colitis    Family History  Problem Relation Age of Onset  . Colon cancer Neg Hx   . Cirrhosis Brother     etoh  . Stroke Mother 92  . Coronary artery disease Father 86  . Heart attack Brother     Social History:  reports that he has quit smoking. His smoking use included Cigarettes. He has never used smokeless tobacco. He reports that he does not drink alcohol or use illicit drugs.  Allergies:  Allergies  Allergen Reactions  . Tetracyclines & Related Rash    Medications:  Prior to Admission medications   Medication Sig Start Date End Date  Taking? Authorizing Provider  aspirin EC 81 MG tablet Take 81 mg by mouth daily.     Yes Historical Provider, MD  azithromycin (ZITHROMAX Z-PAK) 250 MG tablet Take 250 mg by mouth daily.     Yes Historical Provider, MD  azithromycin (ZITHROMAX) 500 MG tablet Take 500 mg by mouth daily.     Yes Historical Provider, MD  cloNIDine (CATAPRES) 0.2 MG tablet 1 tablet 2 (two) times daily.  06/11/11  Yes Historical Provider, MD  diazepam (VALIUM) 5 MG tablet 1 tablet 2 (two) times daily.  06/11/11  Yes Historical Provider, MD  hydrochlorothiazide (HYDRODIURIL) 25 MG tablet 1 tablet daily.  06/11/11  Yes Historical Provider, MD  hydrocodone-acetaminophen (LORCET PLUS) 7.5-650 MG per tablet 1 tablet every 6 (six) hours as needed. pain 06/11/11  Yes Historical Provider, MD  QUESTRAN 4 G packet DRINK 1 PACK (4 GRAMS) MIXED IN 8 OUNCES OF WATER/JUICE 1- 2 TIMES A DAY AS NEEDED FOR DIARRHEA.DO NOT TAKE WITHIN 2 HOURS OF OTHER MEDICATI 08/11/11  Yes Vickey Huger, NP  verapamil (CALAN-SR) 180 MG CR tablet 1 tablet 2 (two) times daily.  06/11/11  Yes Historical Provider, MD  TEGRETOL 200 MG tablet 1 tablet 2 (two) times daily.  06/11/11   Historical Provider, MD   Scheduled Meds:   . ipratropium  0.5 mg Nebulization Q4H  And  . albuterol  2.5 mg Nebulization Q4H  . aspirin EC  81 mg Oral Daily  . carbamazepine  200 mg Oral BID  . cefTRIAXone (ROCEPHIN)  IV  1 g Intravenous Q24H  . cloNIDine  0.2 mg Oral BID  . diazepam  5 mg Oral BID  . hydrochlorothiazide  12.5 mg Oral Daily  . metroNIDAZOLE  500 mg Oral Q8H  . moxifloxacin  400 mg Oral q1800  . potassium chloride  20 mEq Oral BID  . verapamil  180 mg Oral BID  . DISCONTD: moxifloxacin  400 mg Intravenous Q24H   Continuous Infusions:   . dextrose 5 % and 0.9 % NaCl with KCl 20 mEq/L 65 mL/hr (10/06/11 1528)  . DISCONTD: 0.9 % NaCl with KCl 20 mEq / L 1,000 mL infusion 50 mL/hr at 10/05/11 1446   PRN Meds:.guaiFENesin-dextromethorphan,  HYDROcodone-acetaminophen, levalbuterol, morphine, ondansetron (ZOFRAN) IV, ondansetron   Results for orders placed during the hospital encounter of 10/02/11 (from the past 48 hour(s))  CBC     Status: Abnormal   Collection Time   10/05/11 11:43 AM      Component Value Range Comment   WBC 9.0  4.0 - 10.5 (K/uL)    RBC 2.69 (*) 4.22 - 5.81 (MIL/uL)    Hemoglobin 9.9 (*) 13.0 - 17.0 (g/dL)    HCT 28.1 (*) 39.0 - 52.0 (%)    MCV 104.5 (*) 78.0 - 100.0 (fL)    MCH 36.8 (*) 26.0 - 34.0 (pg)    MCHC 35.2  30.0 - 36.0 (g/dL)    RDW 15.7 (*) 11.5 - 15.5 (%)    Platelets 170  150 - 400 (K/uL)   BASIC METABOLIC PANEL     Status: Abnormal   Collection Time   10/05/11 11:43 AM      Component Value Range Comment   Sodium 144  135 - 145 (mEq/L)    Potassium 3.7  3.5 - 5.1 (mEq/L)    Chloride 108  96 - 112 (mEq/L)    CO2 28  19 - 32 (mEq/L)    Glucose, Bld 96  70 - 99 (mg/dL)    BUN 14  6 - 23 (mg/dL)    Creatinine, Ser 1.04  0.50 - 1.35 (mg/dL)    Calcium 8.8  8.4 - 10.5 (mg/dL)    GFR calc non Af Amer 72 (*) >90 (mL/min)    GFR calc Af Amer 83 (*) >90 (mL/min)     No results found.  Review of Systems  Unable to perform ROS: mental status change   Blood pressure 179/93, pulse 86, temperature 96.5 F (35.8 C), temperature source Oral, resp. rate 22, height 5\' 7"  (1.702 m), weight 65.59 kg (144 lb 9.6 oz), SpO2 96.00%. Physical Exam  Assessment/Plan: See dictation  [place] 10/06/2011, 8:02 PM

## 2011-10-06 NOTE — Consult Note (Signed)
Jordan Ward, SATURNO                  ACCOUNT NO.:  [id number]  MEDICAL RECORD NO.:  [id number]  LOCATION:  [id number]                          FACILITY:  [facility]  PHYSICIAN:  Raphael Fitzpatrick A. Merlene Laughter, M.D. DATE OF BIRTH:  1941/05/17  DATE OF CONSULTATION: DATE OF DISCHARGE:                                CONSULTATION   HISTORY OF PRESENT ILLNESS:  The patient is a 71 year old man who has a baseline history of multiple sclerosis.  He has been diagnosed with multiple sclerosis since he was 71 years old.  It looks like he has never been on disease-modifying therapy, but may have been on steroids. The patient at baseline, walks out with a cane in the house, using his left hand, he does use a walker once he gets out of the house.  He has had some falls; however, the patient apparently has baseline history of mild diarrhea.  He has had some upper urinary tract infection and was placed on Z-PAK.  His diarrhea has gotten a lot worse 4 days after the patient was on antibiotics.  The wife reports that he is actually relatively active, he tends to his own personal hygrine, shaves, dresses himself and does a little light housekeeping with helping around the house, sometimes washing the dishes.  He has been noted to be significantly changed today.  The nurse reports that he was more responsive talking today; however, he has been relatively unresponsive, not saying a whole lot, just saying yes or no with much prompting.  His workup has been positive for C. difficile.  MRI apparently showed acute small left cerebral infarcts.  The scan was done a few days ago.  He is on Tegretol, which apparently he takes for tremors.  If he does not take his Tegretol, he has tremors, which are quite bad, and the tremors affect his ability to ambulate and function.  PHYSICAL EXAMINATION:  GENERAL:  On exam today, the patient is awake and alert.  He however requires much prompting to follow commands.  He has significant dysarthria,  mostly due to a profound hypophonia.  He does follow commands with much prompting. HEENT:  Neck is supple.  Head is normocephalic, atraumatic. ABDOMEN:  Soft. EXTREMITIES:  No edema of the upper extremities, and this is associated with some ecchymoses. MENTATION:  Again, he is awake and alert.  He does follow commands with much prompting. CRANIAL NERVES EVALUATION:  Pupils are 4 mm and reactive.  Visual fields are intact.  Extraocular movements appear to be full without nystagmus. Facial muscle strength is symmetric.  Tongue appears to be in midline. MOTOR:  Examination shows antigravity strength of the upper and lower extremities.  Exact strength is not obtainable.  Reflexes are preserved. Sensation is normal to the light touch and temperature.  Plantars are equivocal.  MRI is reviewed in person in detail, the MRI from a couple of days ago was reviewed and compared to the one done in March 2012, essentially the deep white matter periventricular leukoencephalopathy is unchanged, he has significant confluent leukoencephalopathy.  They are few of these white matter lesions which are perpendicular to the ventricle.  He does have what appears to  be black holes of some of these perpendicular white matter lesions, especially on the right centrum semiovale.  There may be a couple of lacunar infarcts involving the basal ganglia bilaterally, especially on the right side which extends to the internal capsule in the corona radiata.  There appears to be an acute infarct small involving the left cerebellum.  ASSESSMENT: 1. Encephalopathy which apparently has changed over the course of the     last several hours.  The daughter reports that there may be some     fluctuation, the exact cause is unknown.  It is possible that he     may have had another stroke given the acute and abrupt changes.  He     does not appear to have significant metabolic derangement, at least     based on lab so far. 2.  Baseline advanced multiple sclerosis. 3. History of tremors.  RECOMMENDATION:  We will obtain an EEG.  He had extensive labs pending tomorrow that should be helpful.  If labs are unrevealing, we will go and repeat imaging, probably the head CT scan.  Also, consider ammonia level and carotid duplex Doppler.  Thanks for this consultation.     Shavar Gorka A. Merlene Laughter, M.D.     KAD/MEDQ  D:  10/06/2011  T:  10/06/2011  Job:  [id number]

## 2011-10-06 NOTE — Discharge Summary (Signed)
Subjective: The patient is asleep. He is arousable. He has no complaints of chest pain or shortness of breath. He does not recall when he had his last bowel movement. Nursing reports no diarrhea. Nursing also reports that he has not had anything to eat in 3 days.  Objective: Vital signs in last 24 hours: Filed Vitals:   10/06/11 0442 10/06/11 0825 10/06/11 1116 10/06/11 1436  BP: 148/72   179/93  Pulse: 60   86  Temp: 96.5 F (35.8 C)   96.5 F (35.8 C)  TempSrc: Oral   Oral  Resp: 24   22  Height:      Weight: 65.59 kg (144 lb 9.6 oz)     SpO2: 90% 97% 94% 98%    Intake/Output Summary (Last 24 hours) at 10/06/11 1519 Last data filed at 10/06/11 1255  Gross per 24 hour  Intake      0 ml  Output    675 ml  Net   -675 ml    Weight change: -2.177 kg (-4 lb 12.8 oz)  Physical exam: Lungs: Occasional crackles in the bases, otherwise clear anteriorly. Heart: Distant S1, S2, with a soft systolic murmur. Abdomen: Positive bowel sounds, soft, mildly obese, nontender, nondistended. GU: Indwelling Foley catheter which the patient says is chronic. Extremities: No pedal edema. Neurologic: He is able to raise both of his arms against gravity approximately 90. He has a slightly weakened hand grip bilaterally. He is able to lift both legs against gravity approximately 20. Sensation is grossly intact. His speech is slow but he is alert and oriented to himself and place. Cranial nerves II through XII are grossly intact with exception of mild dysarthria which may be chronic.  Lab Results: Basic Metabolic Panel:  Basename 10/05/11 1143 10/04/11 0740  NA 144 141  K 3.7 3.4*  CL 108 110  CO2 28 26  GLUCOSE 96 92  BUN 14 14  CREATININE 1.04 0.97  CALCIUM 8.8 8.7  MG -- --  PHOS -- --   Liver Function Tests: No results found for this basename: AST:2,ALT:2,ALKPHOS:2,BILITOT:2,PROT:2,ALBUMIN:2 in the last 72 hours No results found for this basename: LIPASE:2,AMYLASE:2 in the last 72  hours No results found for this basename: AMMONIA:2 in the last 72 hours CBC:  Basename 10/05/11 1143 10/04/11 0740  WBC 9.0 7.4  NEUTROABS -- --  HGB 9.9* 7.7*  HCT 28.1* 23.0*  MCV 104.5* 100.9*  PLT 170 144*   Cardiac Enzymes:  Basename 10/03/11 1532  CKTOTAL 780*  CKMB 8.2*  CKMBINDEX --  TROPONINI <0.30   BNP:  Basename 10/04/11 0729  PROBNP 580.5*   D-Dimer: No results found for this basename: DDIMER:2 in the last 72 hours CBG: No results found for this basename: GLUCAP:6 in the last 72 hours Hemoglobin A1C: No results found for this basename: HGBA1C in the last 72 hours Fasting Lipid Panel: No results found for this basename: CHOL,HDL,LDLCALC,TRIG,CHOLHDL,LDLDIRECT in the last 72 hours Thyroid Function Tests: No results found for this basename: TSH,T4TOTAL,FREET4,T3FREE,THYROIDAB in the last 72 hours Anemia Panel: No results found for this basename: VITAMINB12,FOLATE,FERRITIN,TIBC,IRON,RETICCTPCT in the last 72 hours Coagulation: No results found for this basename: LABPROT:2,INR:2 in the last 72 hours Urine Drug Screen: Drugs of Abuse  No results found for this basename: labopia, cocainscrnur, labbenz, amphetmu, thcu, labbarb    Alcohol Level: No results found for this basename: ETH:2 in the last 72 hours   Micro: Recent Results (from the past 240 hour(s))  CLOSTRIDIUM DIFFICILE BY PCR  Status: Abnormal   Collection Time   10/02/11  8:30 PM      Component Value Range Status Comment   C difficile by pcr POSITIVE (*) NEGATIVE  Final   STOOL CULTURE     Status: Normal   Collection Time   10/02/11  8:33 PM      Component Value Range Status Comment   Specimen Description STOOL   Final    Special Requests NONE   Final    Culture     Final    Value: NO SALMONELLA, SHIGELLA, CAMPYLOBACTER, OR YERSINIA ISOLATED     Note: REDUCED NORMAL FLORA PRESENT   Report Status 10/06/2011 FINAL   Final     Studies/Results: No results found.  Medications: I have  reviewed the patient's current medications.  Assessment: Principal Problem:  *C. difficile diarrhea Active Problems:  Anemia  Hypokalemia  Dehydration  PNA (pneumonia) probable  Junctional rhythm  Multiple sclerosis  UTI (lower urinary tract infection)  Acute CHF (congestive heart failure)  HTN (hypertension), malignant  Chronic indwelling foley catheter  1. C. difficile colitis. Flagyl was started on 10/03/2011. Diarrhea has resolved. Leukocytosis has resolved.  Urinary tract infection. Currently on Rocephin. Of note, he does have a chronic indwelling Foley catheter. Urine culture was not ordered  Probable pneumonia and radiographic findings suggestive of progressive pulmonary fibrosis. He is on Avelox.  Rule out aspiration pneumonia/dysphagia. The patient has been n.p.o. except for medications for 3 days. Speech therapy consultation and recommendations are pending. We'll add dextrose to his IV fluids until the evaluation.  Query congestive heart failure. I am not convinced that the patient has acute decompensated congestive heart failure. He had more congestion after IV fluids and packed red blood cell transfusions. Nevertheless, 2-D echocardiogram results are pending. He was given Lasix once.  Hypertension. Approaching the malignant range. I am not sure if he has underlying autonomic dysfunction. He is being treated with Verapamil SR clonidine. Will restart hydrochlorothiazide at 12.5 mg daily.  Macrocytic anemia. He was transfused 2 units of packed red blood cells. Anemia studies were not ordered. Will order a vitamin B 12 level to rule out deficiency.  Hyponatremia. We'll continue with potassium chloride supplementation.  Chronic multiple sclerosis and generalized deconditioning. Neurology consultation is pending.  Question artifact versus small acute left cerebellar infarct per MRI of the brain.    Plan:  1. Will add dextrose to the IV fluids. Speech therapy  consultation and recommendations regarding diet are pending. We'll continue potassium chloride supplementation. Will recheck his magnesium level in the morning. We'll check the results of the 2-D echocardiogram pending. We'll order a vitamin B 12 level, TSH, and free T4 in the morning. Neurology consultation pending.    LOS: 4 days   Jordan Ward 10/06/2011, 3:19 PM

## 2011-10-06 NOTE — Plan of Care (Signed)
Problem: Phase I Progression Outcomes Goal: Voiding-avoid urinary catheter unless indicated Outcome: Not Applicable Date Met:  [id number] Patient has chronic foley due to MS

## 2011-10-06 NOTE — Progress Notes (Signed)
*  PRELIMINARY RESULTS* Echocardiogram 2D Echocardiogram has been performed.  Tera Partridge 10/06/2011, 3:19 PM

## 2011-10-06 NOTE — Progress Notes (Signed)
PHARMACIST - PHYSICIAN COMMUNICATION DR: [place] Pink    CONCERNING: Antibiotic IV to Oral Route Change Policy  RECOMMENDATION: This patient is receiving Avelox by the intravenous route.  Based on criteria approved by the Pharmacy and Therapeutics Committee, the antibiotic(s) is/are being converted to the equivalent oral dose form(s).   DESCRIPTION: These criteria include:  Patient being treated for a respiratory tract infection, urinary tract infection, or cellulitis  The patient is not neutropenic and does not exhibit a GI malabsorption state  The patient is eating (either orally or via tube) and/or has been taking other orally administered medications for a least 24 hours  The patient is improving clinically and has a Tmax < 100.5  If you have questions about this conversion, please contact the Pharmacy Department  [x]   [phone] )  Forestine Na  S. [place] Crane, PharmD

## 2011-10-06 NOTE — Progress Notes (Signed)
Speech Language/Pathology Clinical/Bedside Swallow Evaluation Patient Details  Name: Jordan Ward MRN: [id number] DOB: [birth year] Today's Date: 10/06/2011  Past Medical History:  Past Medical History  Diagnosis Date  . Arthritis   . MS (multiple sclerosis)   . Insomnia   . HTN (hypertension)   . PVD (peripheral vascular disease)   . Carotid artery stenosis   . TIA (transient ischemic attack)   . Kidney stones   . HTN (hypertension), malignant 10/06/2011  . Chronic indwelling foley catheter 10/06/2011  . Pulmonary fibrosis 10/06/2011   Past Surgical History:  Past Surgical History  Procedure Date  . Cholecystectomy   . Inguinal hernia repair 1971    bilateral  . Back surgery 1976/1979  . Colonoscopy 11/2004    Dr. Sharol Roussel sessile polyp splenic flexure, 17mm sessile polyp desc colon, tubulovillous adenoma (bx not removed)  . Colonoscopy 01/2005    poor prep, polyp could not be found  . Colonoscopy 05/2005    with EMR, polypectomy Dr. Olegario Messier, bx showed high grade dysplasia, partially resected  . Colonoscopy 09/2005    Dr. Arsenio Loader, [place] ink tattooing, four villous colon polyp (3 had been missed on previous colonoscopies due to limitations of procedures  . Colon surgery 09/2005    four tubular adenomas, no cancer  . Appendectomy 09/2005    at time of left hemicolectomy  . Colonoscopy 09/2006    normal TI, no polyps  . Colonoscopy 10/2007    Dr. Imogene Burn distal mammillations, benign bx, normal TI, random bx neg for microscopic colitis   HPI:  71 yo male admitted last Thursday with weakness and diarrhea and long-standing history of MS. His wife was present for BSE and provided background information as pt's responses were delayed and limited. She reports that prior to admission he was able to walk with a cane and is left hand dominant. His delayed response time is his baseline per family. She states that he seems "very different" today than prior to admission. She denies  dysphagia prior to admission except for occasionally coughing with liquids. She is questioning whether this is MS exacerbation and/or stroke.   Assessment/Recommendations/Treatment Plan   SLP Assessment Clinical Impression Statement: Oral phase dysphagia with decreased lingual strength and manipulation and suspected pharyngeal phase dysphagia due to coughing after all textures and consistencies (some immediate and some delayed). Will need MBSS tomorrow. Risk for Aspiration: Severe Other Related Risk Factors: History of pneumonia;Lethargy (MS exacerbation)  Swallow Recommendations Recommended Consults: MBS General Recommendation: NPO except meds;Ice chips PRN after oral care Medication Administration: Crushed with puree (pt reportedly does not like apple sauce, try pudding) Oral Care Recommendations: Staff/trained caregiver to provide oral care Recommendations for Other Services: OT consult;PT consult Follow up Recommendations: Skilled Nursing facility  Treatment Plan Treatment Plan Recommendations: F/U MBS in ___ days (Comment) (MBSS tomorrow) Interventions: Aspiration precaution training;Diet toleration management by SLP;Trials of upgraded texture/liquids;Compensatory techniques;Patient/family education  Prognosis Prognosis for Safe Diet Advancement: Fair  Individuals Consulted Consulted and Agree with Results and Recommendations: Patient;Family member/caregiver;RN Family Member Consulted: Pt's wife  Swallowing Goals   Pending results of MBSS tomorrow.  Swallow Study Prior Functional Status   Pt consumed regular textures and thin liquids prior to admission and walked with a cane.  General  Date of Onset: 10/02/11 HPI: 71 yo male admitted last Thursday with weakness and diarrhea and long-standing history of MS. His wife was present for BSE and provided background information as pt's responses were delayed and limited. She reports that prior  to admission he was able to walk with a  cane and is left hand dominant. His delayed response time is his baseline per family. She states that he seems "very different" today than prior to admission. She denies dysphagia prior to admission except for occasionally coughing with liquids. She is questioning whether this is MS exacerbation and/or stroke. Type of Study: Bedside swallow evaluation Diet Prior to this Study: NPO Temperature Spikes Noted: No Respiratory Status: Supplemental O2 delivered via (comment) History of Intubation: No Behavior/Cognition: Alert;Cooperative;Requires cueing (Delayed response time) Oral Cavity - Dentition: Adequate natural dentition Vision: Functional for self-feeding Patient Positioning: Upright in bed (Pt leaning to left and difficult to reposition.) Baseline Vocal Quality:  (low, deep vocal quality) Volitional Cough: Weak;Congested Volitional Swallow: Able to elicit Ice chips: Tested (comment)  Oral Motor/Sensory Function  Overall Oral Motor/Sensory Function: Impaired Labial ROM: Reduced left Labial Symmetry: Abnormal symmetry left Labial Strength: Reduced Labial Sensation: Within Functional Limits Lingual ROM: Reduced left;Reduced right Lingual Symmetry: Abnormal symmetry right Lingual Strength: Reduced Lingual Sensation: Within Functional Limits Facial ROM: Within Functional Limits Facial Symmetry: Within Functional Limits Facial Strength: Within Functional Limits Facial Sensation: Within Functional Limits Velum:  (could not visualize, decreased oral opening) Mandible: Impaired  Consistency Results  Ice Chips Ice chips: Impaired Presentation: Spoon Oral Phase Impairments: Reduced labial seal;Reduced lingual movement/coordination;Impaired anterior to posterior transit Pharyngeal Phase Impairments: Delayed Swallow;Decreased hyoid-laryngeal movement;Cough - Delayed  Thin Liquid Thin Liquid: Impaired Presentation: Cup Oral Phase Impairments: Reduced labial seal;Reduced lingual  movement/coordination Pharyngeal  Phase Impairments: Cough - Immediate  Nectar Thick Liquid Nectar Thick Liquid: Impaired (sherbet) Presentation: Spoon Oral Phase Impairments: Reduced lingual movement/coordination Pharyngeal Phase Impairments: Delayed Swallow;Decreased hyoid-laryngeal movement;Cough - Delayed  Honey Thick Liquid Honey Thick Liquid: Not tested  Puree Puree: Impaired Presentation: Spoon Oral Phase Impairments: Reduced lingual movement/coordination Pharyngeal Phase Impairments: Delayed Swallow;Cough - Delayed  Solid Solid: Not tested  Jordan Ward, Jordan Ward  Jordan Ward 10/06/2011,6:32 PM

## 2011-10-07 ENCOUNTER — Inpatient Hospital Stay (HOSPITAL_COMMUNITY): Payer: Medicare Other

## 2011-10-07 ENCOUNTER — Encounter (HOSPITAL_COMMUNITY): Payer: Self-pay | Admitting: Internal Medicine

## 2011-10-07 ENCOUNTER — Inpatient Hospital Stay (HOSPITAL_COMMUNITY)
Admit: 2011-10-07 | Discharge: 2011-10-07 | Disposition: A | Discharge status: Home / Self Care | Payer: Medicare Other | Attending: Neurology | Admitting: Neurology

## 2011-10-07 DIAGNOSIS — E870 Hyperosmolality and hypernatremia: Secondary | ICD-10-CM | POA: Diagnosis not present

## 2011-10-07 DIAGNOSIS — R1310 Dysphagia, unspecified: Secondary | ICD-10-CM | POA: Diagnosis present

## 2011-10-07 LAB — COMPREHENSIVE METABOLIC PANEL
Albumin: 2.8 g/dL — ABNORMAL LOW (ref 3.5–5.2)
BUN: 12 mg/dL (ref 6–23)
CO2: 29 mEq/L (ref 19–32)
Chloride: 110 mEq/L (ref 96–112)
Creatinine, Ser: 0.97 mg/dL (ref 0.50–1.35)
GFR calc Af Amer: >90 mL/min (ref >90)
GFR calc non Af Amer: 83 mL/min — ABNORMAL LOW (ref >90)
Glucose, Bld: 102 mg/dL — ABNORMAL HIGH (ref 70–99)
Total Bilirubin: 0.2 mg/dL — ABNORMAL LOW (ref 0.3–1.2)

## 2011-10-07 LAB — AMMONIA: Ammonia: 47 umol/L (ref 11–60)

## 2011-10-07 LAB — VITAMIN B12: Vitamin B-12: 556 pg/mL (ref 211–911)

## 2011-10-07 LAB — CBC
Platelets: 180 10*3/uL (ref 150–400)
RBC: 3.29 MIL/uL — ABNORMAL LOW (ref 4.22–5.81)
RDW: 15.1 % (ref 11.5–15.5)
WBC: 7 10*3/uL (ref 4.0–10.5)

## 2011-10-07 LAB — T4, FREE: Free T4: 0.28 ng/dL — ABNORMAL LOW (ref 0.80–1.80)

## 2011-10-07 LAB — TSH: TSH: 46.564 u[IU]/mL — ABNORMAL HIGH (ref 0.350–4.500)

## 2011-10-07 MED ORDER — KCL IN DEXTROSE-NACL 20-5-0.45 MEQ/L-%-% IV SOLN
INTRAVENOUS | Status: DC
  Administered 2011-10-07 – 2011-10-09 (×3): via INTRAVENOUS

## 2011-10-07 NOTE — Progress Notes (Addendum)
Physical Therapy Treatment Patient Details Name: Jordan Ward MRN: [id number] DOB: 14-Sep-1941 Today's Date: 10/07/2011 Time: 810-830 Charges: Ther act x 18'  PT Assessment/Plan  PT - Assessment/Plan Comments on Treatment Session: Pt does not communicate verbally with therapist. Pt nods and shakes for yes or no answer. Pt completes trolling with increased ease. Attempted to sit at EOB but pt unable to complete task. When pt is asked to puch to sit up there is no effort put forth. Attempted heel slide with pt but pt unable to follow directions for bed exercises. One R leg was extended he as unable to flex it when asked.  PT Goals  Acute Rehab PT Goals PT Goal Formulation: With patient Time For Goal Achievement: 2 weeks Pt will Roll Supine to Right Side: with mod assist PT Goal: Rolling Supine to Right Side - Progress: Met Pt will Roll Supine to Left Side: with mod assist PT Goal: Rolling Supine to Left Side - Progress: Met Pt will go Supine/Side to Sit: with mod assist PT Goal: Supine/Side to Sit - Progress: Progressing toward goal Pt will Sit at Edge of Bed: 1-2 min;with supervision;with bilateral upper extremity support PT Goal: Sit at Edge Of Bed - Progress: Progressing toward goal Pt will go Sit to Supine/Side: with min assist PT Goal: Sit to Supine/Side - Progress: Progressing toward goal Pt will go Sit to Stand: with mod assist PT Goal: Sit to Stand - Progress: Progressing toward goal Pt will go Stand to Sit: with mod assist PT Goal: Stand to Sit - Progress: Progressing toward goal Pt will Transfer Bed to Chair/Chair to Bed: with mod assist PT Transfer Goal: Bed to Chair/Chair to Bed - Progress: Progressing toward goal  PT Treatment Precautions/Restrictions  Precautions Precautions: Fall Required Braces or Orthoses: No Restrictions Weight Bearing Restrictions: No Mobility (including Balance) Bed Mobility Bed Mobility: Yes Rolling Right: 3: Mod assist Rolling Right Details  (indicate cue type and reason): Pt completes rolling with increased ease this session. Pt requires multimodal cueing to complete task. Rolling Left: 3: Mod assist Transfers Transfers:  (Attempted to sit at EOB. Pt Unable to complete) Ambulation/Gait Ambulation/Gait: No Stairs: No Wheelchair Mobility Wheelchair Mobility: No    End of Session PT - End of Session Activity Tolerance: Patient limited by fatigue Patient left: in bed;with call bell in reach;with bed alarm set General Behavior During Session: Flat affect Cognition: [facility] for tasks performed  [name] 10/07/2011, 8:41 AM

## 2011-10-07 NOTE — Procedures (Signed)
Modified Barium Swallow Procedure Note Patient Details  Name: Jordan Ward MRN: [id number] Date of Birth: September 05, 1941  Today's Date: 10/07/2011 Time:  - 12:15-13:00    Past Medical History:  Past Medical History  Diagnosis Date  . Arthritis   . MS (multiple sclerosis)   . Insomnia   . HTN (hypertension)   . PVD (peripheral vascular disease)   . Carotid artery stenosis   . TIA (transient ischemic attack)   . Kidney stones   . HTN (hypertension), malignant 10/06/2011  . Chronic indwelling foley catheter 10/06/2011  . Pulmonary fibrosis 10/06/2011   Past Surgical History:  Past Surgical History  Procedure Date  . Cholecystectomy   . Inguinal hernia repair 1971    bilateral  . Back surgery 1976/1979  . Colonoscopy 11/2004    Dr. Sharol Roussel sessile polyp splenic flexure, 52mm sessile polyp desc colon, tubulovillous adenoma (bx not removed)  . Colonoscopy 01/2005    poor prep, polyp could not be found  . Colonoscopy 05/2005    with EMR, polypectomy Dr. Olegario Messier, bx showed high grade dysplasia, partially resected  . Colonoscopy 09/2005    Dr. Arsenio Loader, [place] ink tattooing, four villous colon polyp (3 had been missed on previous colonoscopies due to limitations of procedures  . Colon surgery 09/2005    four tubular adenomas, no cancer  . Appendectomy 09/2005    at time of left hemicolectomy  . Colonoscopy 09/2006    normal TI, no polyps  . Colonoscopy 10/2007    Dr. Imogene Burn distal mammillations, benign bx, normal TI, random bx neg for microscopic colitis   HPI:   See previous BSE  Recommendation/Prognosis  Clinical Impression Dysphagia Diagnosis: Moderate oral phase dysphagia;Moderate pharyngeal phase dysphagia;Severe pharyngeal phase dysphagia Clinical impression: Moderate oral phase dysphagia characterized by decreased oral control, weak lingual manipulation, decreased mastication of semi-solids and moderate/severe pharyngeal phase dysphagia characterized by premature  spillage to pyriforms (better with puree and teaspoon presentation honey-thick liquids), delay in swallow initiation with spillover of liquids into laryngeal vestibule during the swallow, and decreased hyolaryngeal excursion. Pt was variably sensate to aspirate, but his cough was very weak and often ineffective. A chin tuck posture helped 50% of the time. Best performance is with puree and honey-thick liquids by teaspoon presentation. Swallow Recommendations Recommended Consults: OT self-feeding Solid Consistency: Dysphagia 1 (Puree) Liquid Consistency: Honey Liquid Administration via: Spoon (feeder assist by staff or trained caregiver) Medication Administration: Crushed with puree (pt prefers pudding to applesauce) Supervision: Staff feed patient;Full supervision/cueing for compensatory strategies Compensations: Slow rate;Small sips/bites;Multiple dry swallows after each bite/sip;Effortful swallow Postural Changes and/or Swallow Maneuvers: Seated upright 90 degrees;Upright 30-60 min after meal (head neutral to down position (not tipped back).) Oral Care Recommendations: Oral care QID;Staff/trained caregiver to provide oral care Other Recommendations: Order thickener from pharmacy;Prohibited food (jello, ice cream, thin soups);Remove water pitcher;Clarify dietary restrictions Recommendations for Other Services: OT consult;Rehab consult Follow up Recommendations: Inpatient Rehab;Skilled Nursing facility Prognosis Prognosis for Safe Diet Advancement: Fair Barriers to Reach Goals: Severity of dysphagia Barriers/Prognosis Comment: Good with improved endurance and strength Individuals Consulted Consulted and Agree with Results and Recommendations: MD;RN;Family member/caregiver Family Member Consulted: wife   SLP Goals  Safe and efficient consumption of highest recommended diet with use of strategies prn. (Puree and honey-thick liquids by teaspoon)  General:  Date of Onset: 10/02/11 HPI: 71 yo  male admitted last Thursday with weakness and diarrhea and long-standing history of MS. His wife was present for BSE and provided background information  as pt's responses were delayed and limited. She reports that prior to admission he was able to walk with a cane and is left hand dominant. His delayed response time is his baseline per family. She states that he seems "very different" today than prior to admission. She denies dysphagia prior to admission except for occasionally coughing with liquids. She is questioning whether this is MS exacerbation and/or stroke. Type of Study: Initial MBS Diet Prior to this Study: NPO Temperature Spikes Noted: No Respiratory Status: Supplemental O2 delivered via (comment) History of Intubation: No Behavior/Cognition: Requires cueing;Cooperative (Delayed response time) Oral Cavity - Dentition: Adequate natural dentition Oral Motor / Sensory Function: Impaired - see Bedside swallow eval Vision: Functional for self-feeding (needs feeder assist) Patient Positioning: Upright in chair Baseline Vocal Quality: Breathy;Low vocal intensity (pt unable to voice today, whisper, but audible cough) Volitional Cough: Weak;Congested Volitional Swallow: Unable to elicit Anatomy: Within functional limits Pharyngeal Secretions: Not observed secondary MBS Ice chips: Not tested  Reason for Referral:  Objectively evaluate swallow function due to concerns of possible aspiration and identify appropriate diet and compensatory strategies as needed.   Oral Phase Oral Preparation/Oral Phase Oral Phase: Impaired Oral - Nectar Oral - Nectar Cup:  (decreased oral control, premature spillage) Oral - Thin Oral - Thin Teaspoon:  (decreased oral control, premature spillage) Oral - Solids Oral - Mechanical Soft: Impaired mastication;Weak lingual manipulation;Piecemeal swallowing (did not really masticate the cracker) Oral Phase - Comment Oral Phase - Comment: Decreased oral control,  weak lingual manipulation, premature spillage over base of tongue with liquids by cup.  Pharyngeal Phase  Pharyngeal Phase Pharyngeal Phase: Impaired Pharyngeal - Honey Pharyngeal - Honey Teaspoon: Delayed swallow initiation;Premature spillage to valleculae;Premature spillage to pyriform Pharyngeal - Honey Cup: Delayed swallow initiation;Premature spillage to valleculae;Premature spillage to pyriform;Reduced anterior laryngeal mobility;Reduced laryngeal elevation;Penetration/Aspiration during swallow;Compensatory strategies attempted (Comment) (does well with teaspoon presentation) Penetration/Aspiration details (honey cup): Material enters airway, remains ABOVE vocal cords then ejected out;Material does not enter airway Pharyngeal - Nectar Pharyngeal - Nectar Cup: Delayed swallow initiation;Premature spillage to valleculae;Premature spillage to pyriform;Reduced epiglottic inversion;Reduced anterior laryngeal mobility;Reduced airway/laryngeal closure;Penetration/Aspiration during swallow;Moderate aspiration;Inter-arytenoid space residue;Compensatory strategies attempted (Comment) Penetration/Aspiration details (nectar cup): Material enters airway, passes BELOW cords and not ejected out despite cough attempt by patient Pharyngeal - Thin Pharyngeal - Thin Teaspoon: Delayed swallow initiation;Premature spillage to valleculae;Premature spillage to pyriform;Reduced epiglottic inversion;Reduced anterior laryngeal mobility;Reduced airway/laryngeal closure;Penetration/Aspiration during swallow;Moderate aspiration;Lateral channel residue;Inter-arytenoid space residue;Compensatory strategies attempted (Comment) (chin tuck, head turn ineffective 50% of time) Penetration/Aspiration details (thin teaspoon): Material enters airway, passes BELOW cords without attempt by patient to eject out (silent aspiration) Pharyngeal Phase - Comment Pharyngeal Comment: Premature spillage, delay in swallow initiation, decreased  hyolaryngeal excursion, penetration and aspiration during the swalllow with nectars and thins despite compensatory strategies  (SIGNIFICANT delay in initiation)  Cervical Esophageal Phase  Cervical Esophageal Phase Cervical Esophageal Phase: WFL [facility] for UES, did not pan down)   Genene Churn, Fergus  Winslow Verrill 10/07/2011, 1:33 PM

## 2011-10-07 NOTE — Progress Notes (Signed)
Subjective: Interval History: SEE DICTATION  Objective: Vital signs in last 24 hours: Temp:  [96.5 F (35.8 C)-98.3 F (36.8 C)] 98.3 F (36.8 C) (01/07 2100) Pulse Rate:  [59-92] 92  (01/08 0548) Resp:  [16-22] 16  (01/08 0548) BP: (167-185)/(88-100) 167/88 mmHg (01/08 0548) SpO2:  [93 %-98 %] 93 % (01/08 0716) Weight:  [65.046 kg (143 lb 6.4 oz)] 143 lb 6.4 oz (65.046 kg) (01/08 0548)  Intake/Output from previous day: 01/07 0701 - 01/08 0700 In: 0  Out: 1000 [Urine:1000] Intake/Output this shift:   Nutritional status:      Lab Results:  Ammonia 47; Alphos 157  Basename 10/07/11 0457 10/05/11 1143  WBC 7.0 9.0  HGB 10.7* 9.9*  HCT 33.1* 28.1*  PLT 180 170  NA 147* 144  K 3.7 3.7  CL 110 108  CO2 29 28  GLUCOSE 102* 96  BUN 12 14  CREATININE 0.97 1.04  CALCIUM 9.0 8.8  LABA1C -- --   Lipid Panel No results found for this basename: CHOL,TRIG,HDL,CHOLHDL,VLDL,LDLCALC in the last 72 hours  Studies/Results: No results found.  Medications:  Scheduled Meds:   . ipratropium  0.5 mg Nebulization Q4H   And  . albuterol  2.5 mg Nebulization Q4H  . aspirin EC  81 mg Oral Daily  . carbamazepine  200 mg Oral BID  . cefTRIAXone (ROCEPHIN)  IV  1 g Intravenous Q24H  . cloNIDine  0.2 mg Oral BID  . diazepam  5 mg Oral BID  . hydrochlorothiazide  12.5 mg Oral Daily  . metroNIDAZOLE  500 mg Oral Q8H  . moxifloxacin  400 mg Oral q1800  . potassium chloride  20 mEq Oral BID  . verapamil  180 mg Oral BID  . DISCONTD: moxifloxacin  400 mg Intravenous Q24H   Continuous Infusions:   . dextrose 5 % and 0.9 % NaCl with KCl 20 mEq/L 65 mL/hr at 10/07/11 0808  . DISCONTD: 0.9 % NaCl with KCl 20 mEq / L 1,000 mL infusion 50 mL/hr at 10/05/11 1446   PRN Meds:.guaiFENesin-dextromethorphan, HYDROcodone-acetaminophen, levalbuterol, morphine, ondansetron (ZOFRAN) IV, ondansetron   Assessment/Plan: SEE DICTATION   LOS: 5 days   Kerryann Allaire

## 2011-10-07 NOTE — Progress Notes (Addendum)
Subjective: The patient is laying in bed. He has no complaints. His wife and daughter are in the room. His wife says that he has been intermittently alert, and other times, he has been virtually nonresponsive.  Objective: Vital signs in last 24 hours: Filed Vitals:   10/07/11 0048 10/07/11 0548 10/07/11 0716 10/07/11 1121  BP:  167/88    Pulse:  92    Temp:      TempSrc:      Resp:  16    Height:      Weight:  65.046 kg (143 lb 6.4 oz)    SpO2: 94% 96% 93% 93%    Intake/Output Summary (Last 24 hours) at 10/07/11 1502 Last data filed at 10/07/11 0948  Gross per 24 hour  Intake      0 ml  Output   1800 ml  Net  -1800 ml    Weight change: -0.544 kg (-1 lb 3.2 oz)  Physical exam: Lungs: Occasional crackles in the bases and anteriorly. Heart: Distant S1, S2, with a soft systolic murmur. Abdomen: Positive bowel sounds, soft, mildly obese, nontender, nondistended. GU: Indwelling Foley catheter which the patient says is chronic. Extremities: No pedal edema. Neurologic: He is alert and oriented to himself, his wife, and daughter. He squeezes the examiner's hands with both of his hands weakly. Cranial nerves II through XII are grossly intact with exception of mild dysarthria which may be chronic.  Lab Results: Basic Metabolic Panel:  Basename 10/07/11 0457 10/05/11 1143  NA 147* 144  K 3.7 3.7  CL 110 108  CO2 29 28  GLUCOSE 102* 96  BUN 12 14  CREATININE 0.97 1.04  CALCIUM 9.0 8.8  MG -- --  PHOS -- --   Liver Function Tests:  [facility] 10/07/11 0457  AST 40*  ALT 28  ALKPHOS 157*  BILITOT 0.2*  PROT 7.4  ALBUMIN 2.8*   No results found for this basename: LIPASE:2,AMYLASE:2 in the last 72 hours  Basename 10/07/11 0457  AMMONIA 47   CBC:  Basename 10/07/11 0457 10/05/11 1143  WBC 7.0 9.0  NEUTROABS -- --  HGB 10.7* 9.9*  HCT 33.1* 28.1*  MCV 100.6* 104.5*  PLT 180 170   Cardiac Enzymes: No results found for this basename:  CKTOTAL:3,CKMB:3,CKMBINDEX:3,TROPONINI:3 in the last 72 hours BNP: No results found for this basename: PROBNP:3 in the last 72 hours D-Dimer: No results found for this basename: DDIMER:2 in the last 72 hours CBG: No results found for this basename: GLUCAP:6 in the last 72 hours Hemoglobin A1C: No results found for this basename: HGBA1C in the last 72 hours Fasting Lipid Panel: No results found for this basename: CHOL,HDL,LDLCALC,TRIG,CHOLHDL,LDLDIRECT in the last 72 hours Thyroid Function Tests: No results found for this basename: TSH,T4TOTAL,FREET4,T3FREE,THYROIDAB in the last 72 hours Anemia Panel: No results found for this basename: VITAMINB12,FOLATE,FERRITIN,TIBC,IRON,RETICCTPCT in the last 72 hours Coagulation: No results found for this basename: LABPROT:2,INR:2 in the last 72 hours Urine Drug Screen: Drugs of Abuse  No results found for this basename: labopia,  cocainscrnur,  labbenz,  amphetmu,  thcu,  labbarb    Alcohol Level: No results found for this basename: ETH:2 in the last 72 hours   Micro: Recent Results (from the past 240 hour(s))  CLOSTRIDIUM DIFFICILE BY PCR     Status: Abnormal   Collection Time   10/02/11  8:30 PM      Component Value Range Status Comment   C difficile by pcr POSITIVE (*) NEGATIVE  Final  STOOL CULTURE     Status: Normal   Collection Time   10/02/11  8:33 PM      Component Value Range Status Comment   Specimen Description STOOL   Final    Special Requests NONE   Final    Culture     Final    Value: NO SALMONELLA, SHIGELLA, CAMPYLOBACTER, OR YERSINIA ISOLATED     Note: REDUCED NORMAL FLORA PRESENT   Report Status 10/06/2011 FINAL   Final     Studies/Results: Dg Swallowing Func-no Report  10/07/2011  CLINICAL DATA: dysphagia   FLUOROSCOPY FOR SWALLOWING FUNCTION STUDY:  Fluoroscopy was provided for swallowing function study, which was  administered by a speech pathologist.  Final results and recommendations  from this study are contained  within the speech pathology report.      Medications: I have reviewed the patient's current medications.  Assessment: Principal Problem:  *C. difficile diarrhea Active Problems:  Anemia  Hypokalemia  Dehydration  PNA (pneumonia) probable  Junctional rhythm  Multiple sclerosis  UTI (lower urinary tract infection)  HTN (hypertension), malignant  Chronic indwelling foley catheter  Pulmonary fibrosis  Dysphagia  Hypernatremia  1. C. difficile colitis. Flagyl was started on 10/03/2011. Diarrhea has resolved. Leukocytosis has resolved.  Urinary tract infection. Currently on Rocephin. Will consider discontinuing Rocephin and maintaining coverage with Avelox. Of note, he does have a chronic indwelling Foley catheter. Urine culture was not ordered  Probable pneumonia and radiographic findings suggestive of progressive pulmonary fibrosis. He is on Avelox.  Rule out aspiration pneumonia/dysphagia. The evaluation by the speech therapist is noted. A pured diet with thickened liquids was ordered.  Query congestive heart failure. His ejection fraction is noted to be 55%. He has LVH. I do not believe the patient has congestive heart failure, but has chronic changes consistent with pulmonary fibrosis.  Hypertension. Approaching the malignant range. I am not sure if he has underlying autonomic dysfunction. He is being treated with Verapamil SR and clonidine. Will restart hydrochlorothiazide at 12.5 mg daily.  Macrocytic anemia. He was transfused 2 units of packed red blood cells. Anemia studies were not ordered. Vitamin B 12 level is within normal limits. TSH and free T4 are pending.  Hypokalemia. We'll continue with potassium chloride supplementation.  Hypernatremia. We'll change IV fluids to half-normal saline.  Chronic multiple sclerosis and generalized deconditioning. Neurology consultation and studies noted. Carotid ultrasound and EEG are pending.  Question artifact versus small acute  left cerebellar infarct per MRI of the brain.    Plan:  Change IV fluids to half-normal saline with dextrose and potassium chloride added. Diet as recommended per speech therapist. Neurology recommendations.   LOS: 5 days   Jordan Ward 10/07/2011, 3:02 PM

## 2011-10-07 NOTE — Progress Notes (Signed)
*  PRELIMINARY RESULTS*  EEG has been performed.  Jordan Ward 10/07/2011, 4:59 PM

## 2011-10-08 ENCOUNTER — Encounter (HOSPITAL_COMMUNITY): Payer: Self-pay | Admitting: Internal Medicine

## 2011-10-08 DIAGNOSIS — R911 Solitary pulmonary nodule: Secondary | ICD-10-CM

## 2011-10-08 DIAGNOSIS — R251 Tremor, unspecified: Secondary | ICD-10-CM

## 2011-10-08 DIAGNOSIS — E039 Hypothyroidism, unspecified: Secondary | ICD-10-CM

## 2011-10-08 HISTORY — DX: Hypothyroidism, unspecified: E03.9

## 2011-10-08 HISTORY — DX: Tremor, unspecified: R25.1

## 2011-10-08 HISTORY — DX: Solitary pulmonary nodule: R91.1

## 2011-10-08 MED ORDER — CLONIDINE HCL 0.1 MG PO TABS
0.1000 mg | ORAL_TABLET | Freq: Two times a day (BID) | ORAL | Status: DC
  Administered 2011-10-08 – 2011-10-09 (×3): 0.1 mg via ORAL
  Filled 2011-10-08 (×4): qty 1

## 2011-10-08 MED ORDER — LEVOTHYROXINE SODIUM 50 MCG PO TABS
50.0000 ug | ORAL_TABLET | Freq: Every day | ORAL | Status: DC
  Filled 2011-10-08: qty 1

## 2011-10-08 MED ORDER — IPRATROPIUM BROMIDE 0.02 % IN SOLN
0.5000 mg | RESPIRATORY_TRACT | Status: DC
  Administered 2011-10-08 – 2011-10-09 (×6): 0.5 mg via RESPIRATORY_TRACT
  Filled 2011-10-08 (×6): qty 2.5

## 2011-10-08 MED ORDER — LEVOTHYROXINE SODIUM 100 MCG PO TABS
100.0000 ug | ORAL_TABLET | Freq: Every day | ORAL | Status: DC
  Administered 2011-10-09: 100 ug via ORAL
  Filled 2011-10-08: qty 1

## 2011-10-08 MED ORDER — IPRATROPIUM BROMIDE 0.02 % IN SOLN: 0.5000 mg | RESPIRATORY_TRACT | Status: DC

## 2011-10-08 MED ORDER — LEVOTHYROXINE SODIUM 100 MCG PO TABS
100.0000 ug | ORAL_TABLET | Freq: Every day | ORAL | Status: AC
  Administered 2011-10-08: 100 ug via ORAL
  Filled 2011-10-08: qty 1

## 2011-10-08 MED ORDER — ALBUTEROL SULFATE (5 MG/ML) 0.5% IN NEBU
2.5000 mg | INHALATION_SOLUTION | RESPIRATORY_TRACT | Status: DC
  Administered 2011-10-08 – 2011-10-09 (×6): 2.5 mg via RESPIRATORY_TRACT
  Filled 2011-10-08 (×6): qty 0.5

## 2011-10-08 MED ORDER — ALBUTEROL SULFATE (5 MG/ML) 0.5% IN NEBU: 2.5000 mg | INHALATION_SOLUTION | RESPIRATORY_TRACT | Status: DC

## 2011-10-08 NOTE — Progress Notes (Signed)
Subjective: Interval History: SEE DICTATION  Objective: Vital signs in last 24 hours: Temp:  [97.4 F (36.3 C)-97.5 F (36.4 C)] 97.4 F (36.3 C) (01/09 0630) Pulse Rate:  [63-96] 63  (01/09 0630) Resp:  [16-20] 16  (01/09 0630) BP: (146-166)/(75-91) 146/84 mmHg (01/09 0630) SpO2:  [91 %-96 %] 96 % (01/09 0742) Weight:  [63.504 kg (140 lb)] 63.504 kg (140 lb) (01/09 0630)  Intake/Output from previous day: 01/08 0701 - 01/09 0700 In: 50 [IV Piggyback:50] Out: 800 [Urine:800] Intake/Output this shift: Total I/O In: -  Out: 801 [Urine:800; Stool:1] Nutritional status: Dysphagia    Lab Results:  Ammonia 47; Alph[place] 157  Basename 10/07/11 0457 10/05/11 1143  WBC 7.0 9.0  HGB 10.7* 9.9*  HCT 33.1* 28.1*  PLT 180 170  NA 147* 144  K 3.7 3.7  CL 110 108  CO2 29 28  GLUCOSE 102* 96  BUN 12 14  CREATININE 0.97 1.04  CALCIUM 9.0 8.8  LABA1C -- --   Lipid Panel No results found for this basename: CHOL,TRIG,HDL,CHOLHDL,VLDL,LDLCALC in the last 72 hours  Studies/Results: US Carotid Duplex Bilateral  10/07/2011  *RADIOLOGY REPORT*  Clinical Data: History of Crohn's disease, smoker, hypertension  BILATERAL CAROTID DUPLEX ULTRASOUND  Technique: Gray scale imaging, color Doppler and duplex ultrasound was performed of bilateral carotid and vertebral arteries in the neck.  Comparison:  10/22/2005  Criteria:  Quantification of carotid stenosis is based on velocity parameters that correlate the residual internal carotid diameter with NASCET-based stenosis levels, using the diameter of the distal internal carotid lumen as the denominator for stenosis measurement.  The following velocity measurements were obtained:                   PEAK SYSTOLIC/END DIASTOLIC RIGHT ICA:                        122/17cm/sec CCA:                        [id number] SYSTOLIC ICA/CCA RATIO:     [id number] DIASTOLIC ICA/CCA RATIO:    2.07 ECA:                        58cm/sec  LEFT ICA:                        66/18cm/sec  CCA:                        [id number] SYSTOLIC ICA/CCA RATIO:     [id number] DIASTOLIC ICA/CCA RATIO:    1.94 ECA:                        46cm/sec  Findings:  RIGHT CAROTID ARTERY: Heterogeneous and scattered plaque formation with echogenic shadowing calcification throughout the right carotid system.  Despite this, there is no hemodynamically significant right ICA stenosis, degree of narrowing less than 50%.  Proximal right ICA is tortuous.  RIGHT VERTEBRAL ARTERY:  Antegrade  LEFT CAROTID ARTERY: Heterogeneous left carotid system plaque formation to a lesser degree.  No hemodynamically significant left ICA stenosis, velocity elevation, or turbulent flow.  LEFT VERTEBRAL ARTERY:  Antegrade  IMPRESSION: Right greater than left carotid atherosclerosis.  No hemodynamically significant ICA stenosis by ultrasound on either side.  Original Report Authenticated By: Jerilynn Mages. Daryll Brod, M.D.   Dg Chest [place]  1 View  10/07/2011  *RADIOLOGY REPORT*  Clinical Data: Follow up pneumonia  PORTABLE CHEST - 1 VIEW  Comparison: 10/02/2011  Findings: Cardiomediastinal silhouette is stable.  Coarse bilateral interstitial prominence with peripheral predominance again noted probable due to interstitial fibrosis. There is some residual hazy airspace disease probable residual infiltrate in the right lower lobe with mild improvement in aeration.  A vague nodular density in the right midlung measures 8 mm.  There might be a second nodular density in the right upper lobe measures 6 mm.  Further evaluation with non emergent CT scan of the chest is recommended.  No pulmonary edema.  IMPRESSION:  Coarse bilateral interstitial prominence with peripheral predominance again noted probable due to interstitial fibrosis. There is some residual hazy airspace disease probable residual infiltrate in the right lower lobe with mild improvement in aeration.  A vague nodular density in the right midlung measures 8 mm.  There might be a second nodular density in  the right upper lobe measures 6 mm.  Further evaluation with non emergent CT scan of the chest is recommended.  Original Report Authenticated By: Lahoma Crocker, M.D.   Dg Swallowing Func-no Report  10/07/2011  CLINICAL DATA: dysphagia   FLUOROSCOPY FOR SWALLOWING FUNCTION STUDY:  Fluoroscopy was provided for swallowing function study, which was  administered by a speech pathologist.  Final results and recommendations  from this study are contained within the speech pathology report.      Medications:  Scheduled Meds:    . albuterol  2.5 mg Nebulization Q4H while awake   And  . ipratropium  0.5 mg Nebulization Q4H while awake  . aspirin EC  81 mg Oral Daily  . carbamazepine  200 mg Oral BID  . cefTRIAXone (ROCEPHIN)  IV  1 g Intravenous Q24H  . cloNIDine  0.2 mg Oral BID  . diazepam  5 mg Oral BID  . hydrochlorothiazide  12.5 mg Oral Daily  . metroNIDAZOLE  500 mg Oral Q8H  . moxifloxacin  400 mg Oral q1800  . potassium chloride  20 mEq Oral BID  . verapamil  180 mg Oral BID  . DISCONTD: albuterol  2.5 mg Nebulization Q4H  . DISCONTD: albuterol  2.5 mg Nebulization Q4H  . DISCONTD: ipratropium  0.5 mg Nebulization Q4H  . DISCONTD: ipratropium  0.5 mg Nebulization Q4H while awake   Continuous Infusions:    . dextrose 5 % and 0.45 % NaCl with KCl 20 mEq/L 70 mL/hr at 10/08/11 0708  . DISCONTD: dextrose 5 % and 0.9 % NaCl with KCl 20 mEq/L 65 mL/hr at 10/07/11 0808   PRN Meds:.guaiFENesin-dextromethorphan, HYDROcodone-acetaminophen, levalbuterol, morphine, ondansetron (ZOFRAN) IV, ondansetron   Assessment/Plan: SEE DICTATION   LOS: 6 days   Jonh Mcqueary

## 2011-10-08 NOTE — Progress Notes (Signed)
CSW presented bed offers and pt and pt's family choose [facility].  Facility notified.  Awaiting stability for d/c.  Salome Arnt

## 2011-10-08 NOTE — Progress Notes (Signed)
Jordan Ward, Jordan Ward                  ACCOUNT NO.:  [id number]  MEDICAL RECORD NO.:  [id number]  LOCATION:  [id number]                          FACILITY:  [facility]  PHYSICIAN:  Chiyeko Ferre A. Merlene Laughter, M.D. DATE OF BIRTH:  1941/03/05  DATE OF PROCEDURE: DATE OF DISCHARGE:                                PROGRESS NOTE   The patient has improved significantly overnight, is a lot more responsive today.  He has no complaints.  He gets confused however.  PHYSICAL EXAMINATION:  HEENT EVALUATION:  Neck is supple.  Head is normocephalic, atraumatic.  ABDOMEN:  Soft. EXTREMITIES:  Mild puffy edema of the extremities. GENERAL:  Today he is awake and alert.  He knows name of the hospital, [facility].  He does know why he is here, he does follow commands brisker, he is more responsive.  Speech is a lot more fluent and comprehendible.  He still has some confusion.  He makes some religious comment to thereof to the [employer] saying that he wants to give back his body and that he did something wrong, deceiving people.  Again speech is a lot improved. NEUROLOGIC:  Cranial evaluation shows the pupils are 4 mm and reactive. Extraocular movements are full.  Visual fields are intact.  Facial muscle strength is symmetric.  Tongue is midline.  He has antigravity strength.  I see no tremor or rigidity.  There is no significant bradykinesia noted.  Coordination shows no dysmetria.  LABORATORY EVALUATIONS:  Significant for sodium of 147, ammonia level is 47.  Liver enzymes are slightly elevated in the 40s.  Alkaline phosphatase 157.  ASSESSMENT:  Altered mental status/encephalopathy improved.  At this point in time, he has more of a further toxic metabolic process.  There does appear to be some fluctuations that is quite significant, unclear if this is related to metabolic changes, although this certainly could be, this may also suggest possible nonconvulsive seizures.  EEG has been obtained.  Additional labs are pending.  The  patient does have baseline advanced multiple sclerosis which seems stabilized at this time.  Also baseline tremors are fine.  We will continue to follow the patient.     Jordan Ward A. Merlene Laughter, M.D.     KAD/MEDQ  D:  10/07/2011  T:  10/08/2011  Job:  [id number]

## 2011-10-08 NOTE — Progress Notes (Signed)
NAMENOLE, MONAHAN                  ACCOUNT NO.:  [id number]  MEDICAL RECORD NO.:  [id number]  LOCATION:  [id number]                          FACILITY:  [facility]  PHYSICIAN:  Monseratt Ledin A. Merlene Laughter, M.D. DATE OF BIRTH:  October 28, 1940  DATE OF PROCEDURE: DATE OF DISCHARGE:                                PROGRESS NOTE   The patient continues about the same.  Appears to be some episodes of fluctuation of mentation, but overall he has improved.  He is sleeping in bed today, easily arousable.  He knows he is in the hospital at [facility].  Not oriented to time, however, he thinks it is 1942, which is the year he was born.  He does follow commands.  HEENT:  Neck is supple.  Head is normocephalic, atraumatic. ABDOMEN:  Soft. EXTREMITIES:  Puffy edema upper extremities. NEUROLOGIC:  Extraocular movements are full.  Facial muscle strength symmetric.  Shoulder shrugs are normal.  Visual fields are intact. Motor examination shows antigravity strength.  There is no dysmetria or tremors noted.  EEG is unremarkable.  Laboratory evaluation is significant for a quite high TSH of 46, free T4 is 0.28 which is low, vitamin B12 level is 56. He has had elevated CPK of unclear etiology.  The patient is not on a statin.  ASSESSMENT AND PLAN:  Altered mentation, which has improved in general, though he is having some concerns about some fluctuation.  He has a high TSH and therefore hypothyroidism undoubtedly can contribute.  I am also concerned about the clonidine he is taking for hypertension which is known to cause significant alteration of consciousness/drowsiness.  He is also on Tegretol, level has not been checked.  We will check the level of the Tegretol today.  The clonidine will be reduced and the patient will be started on levothyroxine.     Enrique Manganaro A. Merlene Laughter, M.D.     KAD/MEDQ  D:  10/08/2011  T:  10/08/2011  Job:  [id number]

## 2011-10-08 NOTE — Progress Notes (Signed)
Subjective: The patient is sitting up in bed getting ready to eat lunch. His wife is in the room. She says that he is much more alert and talkative today.  Objective: Vital signs in last 24 hours: Filed Vitals:   10/07/11 1638 10/07/11 2013 10/08/11 0630 10/08/11 0742  BP: 166/91 153/75 146/84   Pulse: 96 85 63   Temp: 97.4 F (36.3 C) 97.5 F (36.4 C) 97.4 F (36.3 C)   TempSrc: Axillary Axillary    Resp: 20  16   Height:      Weight:   63.504 kg (140 lb)   SpO2: 92% 91%  96%    Intake/Output Summary (Last 24 hours) at 10/08/11 1224 Last data filed at 10/08/11 0708  Gross per 24 hour  Intake     50 ml  Output    801 ml  Net   -751 ml    Weight change: -1.542 kg (-3 lb 6.4 oz)  Physical exam: Lungs: Occasional crackles in the bases and anteriorly. Heart: Distant S1, S2, with a soft systolic murmur. Abdomen: Positive bowel sounds, soft, mildly obese, nontender, nondistended. GU: Indwelling Foley catheter which the patient says is chronic. Extremities: No pedal edema. Neurologic: He is alert and oriented to himself, his wife, and place. His voice is clearly stronger and more definitive.  Lab Results: Basic Metabolic Panel:  Basename 10/07/11 0457  NA 147*  K 3.7  CL 110  CO2 29  GLUCOSE 102*  BUN 12  CREATININE 0.97  CALCIUM 9.0  MG --  PHOS --   Liver Function Tests:  [facility] [place] 10/07/11 0457  AST 40*  ALT 28  ALKPHOS 157*  BILITOT 0.2*  PROT 7.4  ALBUMIN 2.8*   No results found for this basename: LIPASE:2,AMYLASE:2 in the last 72 hours  Basename 10/07/11 0457  AMMONIA 47   CBC:  Basename 10/07/11 0457  WBC 7.0  NEUTROABS --  HGB 10.7*  HCT 33.1*  MCV 100.6*  PLT 180   Cardiac Enzymes: No results found for this basename: CKTOTAL:3,CKMB:3,CKMBINDEX:3,TROPONINI:3 in the last 72 hours BNP: No results found for this basename: PROBNP:3 in the last 72 hours D-Dimer: No results found for this basename: DDIMER:2 in the last 72 hours CBG: No  results found for this basename: GLUCAP:6 in the last 72 hours Hemoglobin A1C: No results found for this basename: HGBA1C in the last 72 hours Fasting Lipid Panel: No results found for this basename: CHOL,HDL,LDLCALC,TRIG,CHOLHDL,LDLDIRECT in the last 72 hours Thyroid Function Tests:  [facility] 10/07/11 0457  TSH 46.564*  T4TOTAL --  FREET4 0.28*  T3FREE --  THYROIDAB --   Anemia Panel:  Basename 10/07/11 0457  VITAMINB12 556  FOLATE --  FERRITIN --  TIBC --  IRON --  RETICCTPCT --   Coagulation: No results found for this basename: LABPROT:2,INR:2 in the last 72 hours Urine Drug Screen: Drugs of Abuse  No results found for this basename: labopia,  cocainscrnur,  labbenz,  amphetmu,  thcu,  labbarb    Alcohol Level: No results found for this basename: ETH:2 in the last 72 hours   Micro: Recent Results (from the past 240 hour(s))  CLOSTRIDIUM DIFFICILE BY PCR     Status: Abnormal   Collection Time   10/02/11  8:30 PM      Component Value Range Status Comment   C difficile by pcr POSITIVE (*) NEGATIVE  Final   STOOL CULTURE     Status: Normal   Collection Time   10/02/11  8:33  PM      Component Value Range Status Comment   Specimen Description STOOL   Final    Special Requests NONE   Final    Culture     Final    Value: NO SALMONELLA, SHIGELLA, CAMPYLOBACTER, OR YERSINIA ISOLATED     Note: REDUCED NORMAL FLORA PRESENT   Report Status 10/06/2011 FINAL   Final     Studies/Results: US Carotid Duplex Bilateral  10/07/2011  *RADIOLOGY REPORT*  Clinical Data: History of Crohn's disease, smoker, hypertension  BILATERAL CAROTID DUPLEX ULTRASOUND  Technique: Gray scale imaging, color Doppler and duplex ultrasound was performed of bilateral carotid and vertebral arteries in the neck.  Comparison:  10/22/2005  Criteria:  Quantification of carotid stenosis is based on velocity parameters that correlate the residual internal carotid diameter with NASCET-based stenosis levels, using  the diameter of the distal internal carotid lumen as the denominator for stenosis measurement.  The following velocity measurements were obtained:                   PEAK SYSTOLIC/END DIASTOLIC RIGHT ICA:                        122/17cm/sec CCA:                        [id number] SYSTOLIC ICA/CCA RATIO:     [id number] DIASTOLIC ICA/CCA RATIO:    2.07 ECA:                        58cm/sec  LEFT ICA:                        66/18cm/sec CCA:                        [id number] SYSTOLIC ICA/CCA RATIO:     [id number] DIASTOLIC ICA/CCA RATIO:    1.94 ECA:                        46cm/sec  Findings:  RIGHT CAROTID ARTERY: Heterogeneous and scattered plaque formation with echogenic shadowing calcification throughout the right carotid system.  Despite this, there is no hemodynamically significant right ICA stenosis, degree of narrowing less than 50%.  Proximal right ICA is tortuous.  RIGHT VERTEBRAL ARTERY:  Antegrade  LEFT CAROTID ARTERY: Heterogeneous left carotid system plaque formation to a lesser degree.  No hemodynamically significant left ICA stenosis, velocity elevation, or turbulent flow.  LEFT VERTEBRAL ARTERY:  Antegrade  IMPRESSION: Right greater than left carotid atherosclerosis.  No hemodynamically significant ICA stenosis by ultrasound on either side.  Original Report Authenticated By: Jerilynn Mages. Daryll Brod, M.D.   Dg Chest Port 1 View  10/07/2011  *RADIOLOGY REPORT*  Clinical Data: Follow up pneumonia  PORTABLE CHEST - 1 VIEW  Comparison: 10/02/2011  Findings: Cardiomediastinal silhouette is stable.  Coarse bilateral interstitial prominence with peripheral predominance again noted probable due to interstitial fibrosis. There is some residual hazy airspace disease probable residual infiltrate in the right lower lobe with mild improvement in aeration.  A vague nodular density in the right midlung measures 8 mm.  There might be a second nodular density in the right upper lobe measures 6 mm.  Further evaluation with non emergent CT  scan of the chest is recommended.  No pulmonary edema.  IMPRESSION:  Coarse bilateral interstitial prominence with  peripheral predominance again noted probable due to interstitial fibrosis. There is some residual hazy airspace disease probable residual infiltrate in the right lower lobe with mild improvement in aeration.  A vague nodular density in the right midlung measures 8 mm.  There might be a second nodular density in the right upper lobe measures 6 mm.  Further evaluation with non emergent CT scan of the chest is recommended.  Original Report Authenticated By: Lahoma Crocker, M.D.   Dg Swallowing Func-no Report  10/07/2011  CLINICAL DATA: dysphagia   FLUOROSCOPY FOR SWALLOWING FUNCTION STUDY:  Fluoroscopy was provided for swallowing function study, which was  administered by a speech pathologist.  Final results and recommendations  from this study are contained within the speech pathology report.      Medications: I have reviewed the patient's current medications.  Assessment: Principal Problem:  *C. difficile diarrhea Active Problems:  Anemia  Hypokalemia  Dehydration  PNA (pneumonia) probable  Junctional rhythm  Multiple sclerosis  UTI (lower urinary tract infection)  HTN (hypertension), malignant  Chronic indwelling foley catheter  Pulmonary fibrosis  Dysphagia  Hypernatremia  Tremors of nervous system  Hypothyroidism  Pulmonary nodule  1. C. difficile colitis. Flagyl was started on 10/03/2011. Diarrhea has resolved. Leukocytosis has resolved.  Newly diagnosed hypothyroidism. His TSH and free T4 are noted.  Waxing and waning cognition/encephalopathy. This may be secondary to severe hypothyroidism versus multiple infections versus MS versus rule out seizure. He is clearly more alert and talkative today.  Urinary tract infection. Currently on Rocephin. Will consider discontinuing Rocephin and maintaining coverage with Avelox. Of note, he does have a chronic indwelling Foley  catheter. Urine culture was not ordered  Probable pneumonia and radiographic findings suggestive of progressive pulmonary fibrosis. He is on Avelox.  Subcentimeter pulmonary nodules. Outpatient followup CT is recommended.  Rule out aspiration pneumonia/dysphagia. The evaluation by the speech therapist is noted. A pured diet with thickened liquids was ordered.  Query congestive heart failure. His ejection fraction is noted to be 55%. He has LVH. I do not believe the patient has congestive heart failure, but has chronic changes consistent with pulmonary fibrosis.  Hypertension. Approaching the malignant range. I am not sure if he has underlying autonomic dysfunction. He is being treated with Verapamil SR and clonidine. Restarted hydrochlorothiazide at 12.5 mg daily.  Macrocytic anemia. He was transfused 2 units of packed red blood cells. Anemia studies were not ordered. Vitamin B 12 level is within normal limits. TSH and free T4 are pending.  Hypokalemia. We'll continue with potassium chloride supplementation.  Hypernatremia.  IV fluids changed to half-normal saline.  Chronic multiple sclerosis and generalized deconditioning. Neurology consultation and studies noted. Carotid ultrasound revealed no significant ICA stenosis and EEG are pending.  Question artifact versus small acute left cerebellar infarct per MRI of the brain.    Plan:  We'll give the patient 100 mcg of Synthroid x1 and then start regular dosing of 50 mcg of Synthroid daily.  Check the results of the EEG pending. Check laboratory studies in the morning.  Discharged to skilled nursing facility in the next 24-48 hours.   LOS: 6 days   Tiara Bartoli 10/08/2011, 12:24 PM

## 2011-10-09 ENCOUNTER — Inpatient Hospital Stay
Admission: RE | Admit: 2011-10-09 | Discharge: 2011-10-11 | Disposition: A | Discharge status: Nursing Home (Includes ICF) | Payer: BLUE CROSS/BLUE SHIELD | Source: Ambulatory Visit | Attending: Internal Medicine | Admitting: Internal Medicine

## 2011-10-09 ENCOUNTER — Encounter (HOSPITAL_COMMUNITY): Payer: Self-pay | Admitting: Internal Medicine

## 2011-10-09 DIAGNOSIS — J189 Pneumonia, unspecified organism: Secondary | ICD-10-CM | POA: Diagnosis not present

## 2011-10-09 DIAGNOSIS — M19039 Primary osteoarthritis, unspecified wrist: Secondary | ICD-10-CM | POA: Diagnosis not present

## 2011-10-09 DIAGNOSIS — A0472 Enterocolitis due to Clostridium difficile, not specified as recurrent: Secondary | ICD-10-CM | POA: Diagnosis not present

## 2011-10-09 DIAGNOSIS — J8410 Pulmonary fibrosis, unspecified: Secondary | ICD-10-CM | POA: Diagnosis not present

## 2011-10-09 DIAGNOSIS — I739 Peripheral vascular disease, unspecified: Secondary | ICD-10-CM | POA: Diagnosis not present

## 2011-10-09 DIAGNOSIS — M6281 Muscle weakness (generalized): Secondary | ICD-10-CM | POA: Diagnosis not present

## 2011-10-09 DIAGNOSIS — J984 Other disorders of lung: Secondary | ICD-10-CM | POA: Diagnosis not present

## 2011-10-09 DIAGNOSIS — Z5189 Encounter for other specified aftercare: Secondary | ICD-10-CM | POA: Diagnosis not present

## 2011-10-09 DIAGNOSIS — E039 Hypothyroidism, unspecified: Secondary | ICD-10-CM | POA: Diagnosis not present

## 2011-10-09 DIAGNOSIS — E876 Hypokalemia: Secondary | ICD-10-CM | POA: Diagnosis not present

## 2011-10-09 DIAGNOSIS — Z87442 Personal history of urinary calculi: Secondary | ICD-10-CM | POA: Diagnosis not present

## 2011-10-09 DIAGNOSIS — R918 Other nonspecific abnormal finding of lung field: Secondary | ICD-10-CM | POA: Diagnosis not present

## 2011-10-09 DIAGNOSIS — R0902 Hypoxemia: Secondary | ICD-10-CM | POA: Diagnosis not present

## 2011-10-09 DIAGNOSIS — Z8701 Personal history of pneumonia (recurrent): Secondary | ICD-10-CM | POA: Diagnosis not present

## 2011-10-09 DIAGNOSIS — R937 Abnormal findings on diagnostic imaging of other parts of musculoskeletal system: Secondary | ICD-10-CM | POA: Diagnosis not present

## 2011-10-09 DIAGNOSIS — I10 Essential (primary) hypertension: Secondary | ICD-10-CM | POA: Diagnosis not present

## 2011-10-09 DIAGNOSIS — G9340 Encephalopathy, unspecified: Secondary | ICD-10-CM | POA: Diagnosis not present

## 2011-10-09 DIAGNOSIS — M25439 Effusion, unspecified wrist: Secondary | ICD-10-CM | POA: Diagnosis not present

## 2011-10-09 DIAGNOSIS — R5381 Other malaise: Secondary | ICD-10-CM | POA: Diagnosis not present

## 2011-10-09 DIAGNOSIS — I6330 Cerebral infarction due to thrombosis of unspecified cerebral artery: Secondary | ICD-10-CM | POA: Diagnosis not present

## 2011-10-09 DIAGNOSIS — J438 Other emphysema: Secondary | ICD-10-CM | POA: Diagnosis not present

## 2011-10-09 DIAGNOSIS — R569 Unspecified convulsions: Secondary | ICD-10-CM | POA: Diagnosis not present

## 2011-10-09 DIAGNOSIS — R0989 Other specified symptoms and signs involving the circulatory and respiratory systems: Secondary | ICD-10-CM | POA: Diagnosis not present

## 2011-10-09 DIAGNOSIS — M25539 Pain in unspecified wrist: Secondary | ICD-10-CM | POA: Diagnosis not present

## 2011-10-09 DIAGNOSIS — J159 Unspecified bacterial pneumonia: Secondary | ICD-10-CM | POA: Diagnosis not present

## 2011-10-09 DIAGNOSIS — I6529 Occlusion and stenosis of unspecified carotid artery: Secondary | ICD-10-CM | POA: Diagnosis not present

## 2011-10-09 DIAGNOSIS — N319 Neuromuscular dysfunction of bladder, unspecified: Secondary | ICD-10-CM | POA: Diagnosis not present

## 2011-10-09 DIAGNOSIS — R0602 Shortness of breath: Secondary | ICD-10-CM | POA: Diagnosis not present

## 2011-10-09 DIAGNOSIS — R259 Unspecified abnormal involuntary movements: Secondary | ICD-10-CM | POA: Diagnosis not present

## 2011-10-09 DIAGNOSIS — R1310 Dysphagia, unspecified: Secondary | ICD-10-CM | POA: Diagnosis not present

## 2011-10-09 DIAGNOSIS — F29 Unspecified psychosis not due to a substance or known physiological condition: Secondary | ICD-10-CM | POA: Diagnosis not present

## 2011-10-09 DIAGNOSIS — R197 Diarrhea, unspecified: Secondary | ICD-10-CM | POA: Diagnosis not present

## 2011-10-09 DIAGNOSIS — R911 Solitary pulmonary nodule: Secondary | ICD-10-CM | POA: Diagnosis not present

## 2011-10-09 DIAGNOSIS — G35 Multiple sclerosis: Secondary | ICD-10-CM | POA: Diagnosis not present

## 2011-10-09 DIAGNOSIS — M19049 Primary osteoarthritis, unspecified hand: Secondary | ICD-10-CM | POA: Diagnosis not present

## 2011-10-09 DIAGNOSIS — R05 Cough: Secondary | ICD-10-CM | POA: Diagnosis not present

## 2011-10-09 DIAGNOSIS — M129 Arthropathy, unspecified: Secondary | ICD-10-CM | POA: Diagnosis not present

## 2011-10-09 DIAGNOSIS — J3489 Other specified disorders of nose and nasal sinuses: Secondary | ICD-10-CM | POA: Diagnosis not present

## 2011-10-09 DIAGNOSIS — N471 Phimosis: Secondary | ICD-10-CM | POA: Diagnosis not present

## 2011-10-09 LAB — BASIC METABOLIC PANEL
BUN: 9 mg/dL (ref 6–23)
Creatinine, Ser: 1.07 mg/dL (ref 0.50–1.35)
GFR calc Af Amer: 80 mL/min — ABNORMAL LOW (ref >90)
GFR calc non Af Amer: 69 mL/min — ABNORMAL LOW (ref >90)
Glucose, Bld: 85 mg/dL (ref 70–99)

## 2011-10-09 LAB — CARBAMAZEPINE, FREE AND TOTAL
Carbamazepine Metabolite/: 1.1 ug/mL
Carbamazepine, Bound: 7.3 ug/mL

## 2011-10-09 LAB — CBC
MCH: 33.9 pg (ref 26.0–34.0)
MCHC: 33.2 g/dL (ref 30.0–36.0)
RDW: 15.1 % (ref 11.5–15.5)

## 2011-10-09 MED ORDER — METRONIDAZOLE 500 MG PO TABS: 500.0000 mg | ORAL_TABLET | Freq: Three times a day (TID) | ORAL | Status: AC

## 2011-10-09 MED ORDER — LEVOTHYROXINE SODIUM 50 MCG PO TABS: 50.0000 ug | ORAL_TABLET | Freq: Every day | ORAL | Status: DC

## 2011-10-09 MED ORDER — IPRATROPIUM-ALBUTEROL 18-103 MCG/ACT IN AERO: 2.0000 | INHALATION_SPRAY | Freq: Three times a day (TID) | RESPIRATORY_TRACT | Status: DC

## 2011-10-09 MED ORDER — DIAZEPAM 5 MG PO TABS: 5.0000 mg | ORAL_TABLET | Freq: Two times a day (BID) | ORAL | Status: DC

## 2011-10-09 MED ORDER — POTASSIUM CHLORIDE CRYS ER 20 MEQ PO TBCR: 20.0000 meq | EXTENDED_RELEASE_TABLET | Freq: Every day | ORAL | Status: DC

## 2011-10-09 MED ORDER — HYDROCODONE-ACETAMINOPHEN 7.5-650 MG PO TABS: 1.0000 | ORAL_TABLET | Freq: Four times a day (QID) | ORAL | Status: DC | PRN

## 2011-10-09 NOTE — Progress Notes (Signed)
Patient discharged to [facility] via staff; pt and family aware and happy with discharge; Report called to [place], LPN, at [facility] who stated understanding and denied questions/concerns; pt stable at time of discharge

## 2011-10-09 NOTE — Progress Notes (Signed)
Subjective: Interval History: SEE DICTATION  Objective: Vital signs in last 24 hours: Temp:  [96.8 F (36 C)-97.6 F (36.4 C)] 96.8 F (36 C) (01/10 0654) Pulse Rate:  [65-66] 65  (01/10 0654) Resp:  [16-20] 20  (01/10 0654) BP: (139-162)/(77-83) 144/83 mmHg (01/10 0654) SpO2:  [93 %-97 %] 94 % (01/10 0654) Weight:  [65.227 kg (143 lb 12.8 oz)] 65.227 kg (143 lb 12.8 oz) (01/10 0654)  Intake/Output from previous day: 01/09 0701 - 01/10 0700 In: 650 [I.V.:600; IV Piggyback:50] Out: 2051 [Urine:2050; Stool:1] Intake/Output this shift:   Nutritional status: Dysphagia    Lab Results:  Ammonia 47; [place] 157  Basename 10/09/11 0503 10/07/11 0457  WBC 6.9 7.0  HGB 10.1* 10.7*  HCT 30.4* 33.1*  PLT 189 180  NA 142 147*  K 3.9 3.7  CL 107 110  CO2 31 29  GLUCOSE 85 102*  BUN 9 12  CREATININE 1.07 0.97  CALCIUM 9.3 9.0  LABA1C -- --   Lipid Panel No results found for this basename: CHOL,TRIG,HDL,CHOLHDL,VLDL,LDLCALC in the last 72 hours  Studies/Results: US Carotid Duplex Bilateral  10/07/2011  *RADIOLOGY REPORT*  Clinical Data: History of Crohn's disease, smoker, hypertension  BILATERAL CAROTID DUPLEX ULTRASOUND  Technique: Gray scale imaging, color Doppler and duplex ultrasound was performed of bilateral carotid and vertebral arteries in the neck.  Comparison:  10/22/2005  Criteria:  Quantification of carotid stenosis is based on velocity parameters that correlate the residual internal carotid diameter with NASCET-based stenosis levels, using the diameter of the distal internal carotid lumen as the denominator for stenosis measurement.  The following velocity measurements were obtained:                   PEAK SYSTOLIC/END DIASTOLIC RIGHT ICA:                        122/17cm/sec CCA:                        [id number] SYSTOLIC ICA/CCA RATIO:     [id number] DIASTOLIC ICA/CCA RATIO:    2.07 ECA:                        58cm/sec  LEFT ICA:                        66/18cm/sec CCA:                         [id number] SYSTOLIC ICA/CCA RATIO:     [id number] DIASTOLIC ICA/CCA RATIO:    1.94 ECA:                        46cm/sec  Findings:  RIGHT CAROTID ARTERY: Heterogeneous and scattered plaque formation with echogenic shadowing calcification throughout the right carotid system.  Despite this, there is no hemodynamically significant right ICA stenosis, degree of narrowing less than 50%.  Proximal right ICA is tortuous.  RIGHT VERTEBRAL ARTERY:  Antegrade  LEFT CAROTID ARTERY: Heterogeneous left carotid system plaque formation to a lesser degree.  No hemodynamically significant left ICA stenosis, velocity elevation, or turbulent flow.  LEFT VERTEBRAL ARTERY:  Antegrade  IMPRESSION: Right greater than left carotid atherosclerosis.  No hemodynamically significant ICA stenosis by ultrasound on either side.  Original Report Authenticated By: Jerilynn Mages. Daryll Brod, M.D.   [place] Chest Port 1  View  10/07/2011  *RADIOLOGY REPORT*  Clinical Data: Follow up pneumonia  PORTABLE CHEST - 1 VIEW  Comparison: 10/02/2011  Findings: Cardiomediastinal silhouette is stable.  Coarse bilateral interstitial prominence with peripheral predominance again noted probable due to interstitial fibrosis. There is some residual hazy airspace disease probable residual infiltrate in the right lower lobe with mild improvement in aeration.  A vague nodular density in the right midlung measures 8 mm.  There might be a second nodular density in the right upper lobe measures 6 mm.  Further evaluation with non emergent CT scan of the chest is recommended.  No pulmonary edema.  IMPRESSION:  Coarse bilateral interstitial prominence with peripheral predominance again noted probable due to interstitial fibrosis. There is some residual hazy airspace disease probable residual infiltrate in the right lower lobe with mild improvement in aeration.  A vague nodular density in the right midlung measures 8 mm.  There might be a second nodular density in the right upper  lobe measures 6 mm.  Further evaluation with non emergent CT scan of the chest is recommended.  Original Report Authenticated By: Lahoma Crocker, M.D.   Dg Swallowing Func-no Report  10/07/2011  CLINICAL DATA: dysphagia   FLUOROSCOPY FOR SWALLOWING FUNCTION STUDY:  Fluoroscopy was provided for swallowing function study, which was  administered by a speech pathologist.  Final results and recommendations  from this study are contained within the speech pathology report.      Medications:  Scheduled Meds:    . albuterol  2.5 mg Nebulization Q4H while awake   And  . ipratropium  0.5 mg Nebulization Q4H while awake  . aspirin EC  81 mg Oral Daily  . carbamazepine  200 mg Oral BID  . cefTRIAXone (ROCEPHIN)  IV  1 g Intravenous Q24H  . cloNIDine  0.1 mg Oral BID  . diazepam  5 mg Oral BID  . hydrochlorothiazide  12.5 mg Oral Daily  . levothyroxine  100 mcg Oral QAC breakfast  . levothyroxine  100 mcg Oral QAC breakfast  . metroNIDAZOLE  500 mg Oral Q8H  . moxifloxacin  400 mg Oral q1800  . potassium chloride  20 mEq Oral BID  . verapamil  180 mg Oral BID  . DISCONTD: cloNIDine  0.2 mg Oral BID  . DISCONTD: levothyroxine  50 mcg Oral QAC breakfast   Continuous Infusions:    . dextrose 5 % and 0.45 % NaCl with KCl 20 mEq/L 50 mL/hr at 10/09/11 0127   PRN Meds:.guaiFENesin-dextromethorphan, HYDROcodone-acetaminophen, levalbuterol, morphine, ondansetron (ZOFRAN) IV, ondansetron  Tegretol 6.1 Assessment/Plan: SEE DICTATION   LOS: 7 days   Desmond Tufano

## 2011-10-09 NOTE — Progress Notes (Signed)
Pt d/c today by MD to [facility].  Pt and facility aware and agreeable.  Tammi at [facility] notified of diet orders and contact precautions by CSW.  Pt will transfer with RN.  Salome Arnt

## 2011-10-09 NOTE — Discharge Summary (Signed)
Physician Discharge Summary  Jordan Ward MRN: [id number] DOB/AGE: 1941-06-24 71 y.o.  PCP: MANN,BENJAMIN L, PA, PA-C   Admit date: 10/02/2011 Discharge date: 10/09/2011  Discharge Diagnoses:  1. C. difficile colitis. 2. Probable pneumonia. 3. Radiographic findings consistent with pulmonary fibrosis. 4. Pulmonary nodule. Followup CT scan is recommended in 6 months. 5. Newly diagnosed hypothyroidism. The patient's TSH was 46.5 and his free T4 was 0.28. 6. Encephalopathy etiology multifactorial including infections, hypothyroidism, etc. 7. Urinary tract infection in a setting of chronic indwelling Foley catheter. 8. Worsening dysphagia. He was discharged on a pured diet with honey thickened liquids. He will need a followup speech therapy evaluation at the skilled nursing facility. 9. Chronic tremors. 10. Hypertension. 11. Chronic multiple sclerosis. 12. Hypokalemia. 13. Junctional rhythm on the EKG. 14. Chronic normocytic anemia. His hemoglobin fell to a nadir of 7.7. He was transfused 2 units of packed red blood cells. His hemoglobin was 10.1 at the time of discharge. He will need an an outpatient GI evaluation. 15. Hypernatremia/dehydration.     Current Discharge Medication List    START taking these medications   Details  albuterol-ipratropium (COMBIVENT) 18-103 MCG/ACT inhaler Inhale 2 puffs into the lungs 3 (three) times daily. Qty: 1 Inhaler, Refills: 1    levothyroxine (SYNTHROID, LEVOTHROID) 50 MCG tablet Take 1 tablet (50 mcg total) by mouth daily before breakfast. Qty: 30 tablet, Refills: 2    metroNIDAZOLE (FLAGYL) 500 MG tablet Take 1 tablet (500 mg total) by mouth every 8 (eight) hours. Qty: 24 tablet, Refills: 0    potassium chloride SA (K-DUR,KLOR-CON) 20 MEQ tablet Take 1 tablet (20 mEq total) by mouth daily.      CONTINUE these medications which have CHANGED   Details  diazepam (VALIUM) 5 MG tablet Take 1 tablet (5 mg total) by mouth 2 (two) times  daily. Qty: 30 tablet, Refills: 0    hydrocodone-acetaminophen (LORCET PLUS) 7.5-650 MG per tablet Take 1 tablet by mouth every 6 (six) hours as needed. pain Qty: 30 tablet, Refills: 0      CONTINUE these medications which have NOT CHANGED   Details  aspirin EC 81 MG tablet Take 81 mg by mouth daily.      cloNIDine (CATAPRES) 0.2 MG tablet 1 tablet 2 (two) times daily.     hydrochlorothiazide (HYDRODIURIL) 25 MG tablet 1 tablet daily.     verapamil (CALAN-SR) 180 MG CR tablet 1 tablet 2 (two) times daily.     TEGRETOL 200 MG tablet 1 tablet 2 (two) times daily.       STOP taking these medications     azithromycin (ZITHROMAX Z-PAK) 250 MG tablet      azithromycin (ZITHROMAX) 500 MG tablet      QUESTRAN 4 G packet         Discharge Condition: Improved and stable  Disposition: Skilled nursing facility   Consults: Neurologist, Dr. Merlene Laughter   Significant Diagnostic Studies: Ct Head Wo Contrast  10/02/2011  *RADIOLOGY REPORT*  Clinical Data: Multiple sclerosis and extremity weakness.  CT HEAD WITHOUT CONTRAST  Technique:  Contiguous axial images were obtained from the base of the skull through the vertex without contrast.  Comparison: MRI the brain dated 12/13/2010.  Findings: Advanced small vessel disease again noted with multiple white matter infarcts in the periventricular white matter and bilateral basal ganglia. The brain demonstrates no evidence of hemorrhage, acute infarction, edema, mass effect, extra-axial fluid collection, hydrocephalus or mass lesion.  The skull is unremarkable.  IMPRESSION:  Advanced small vessel disease and old white matter infarcts.  No acute findings.  Original Report Authenticated By: Azzie Roup, M.D.   Mr Jeri Cos [id number] Contrast  10/03/2011  *RADIOLOGY REPORT*  Clinical Data: Weakness.  Multiple falls.  History of multiple sclerosis.  MRI HEAD WITHOUT AND WITH CONTRAST  Technique:  Multiplanar, multiecho pulse sequences of the brain and  surrounding structures were obtained according to standard protocol without and with intravenous contrast  Contrast: 33mL MULTIHANCE GADOBENATE DIMEGLUMINE 529 MG/ML IV SOLN  Comparison: 10/02/2011 CT.  12/13/2010 MR.  Findings: Left cerebellar linear area of altered signal intensity (series 3 image 5), question artifact versus small acute infarct  Remote corona radiata/basal ganglia infarcts.  Prominent white matter type changes consistent with the patient's history multiple sclerosis have changed minimally from prior exam.  Small amount of hemosiderin staining associated with remote right corona radiata infarct otherwise no evidence of intracranial hemorrhage.  Global atrophy without hydrocephalus.  Questionable abnormality posterior to the C3 vertebral body causing cord flattening (series 2 image 9).  No intracranial mass or abnormal enhancement noted on this motion degraded exam.  Minimal paranasal sinus mucosal thickening.  Major intracranial vascular structures are patent.  IMPRESSION: Left cerebellar linear area of altered signal intensity (series 3 image 5), question artifact versus small acute infarct  Remote corona radiata/basal ganglia infarcts.  Prominent white matter type changes consistent with the patient's history multiple sclerosis have changed minimally from prior exam.  Global atrophy without hydrocephalus.  Questionable abnormality posterior to the C3 vertebral body causing cord flattening (series 2 image 9).  Original Report Authenticated By: Doug Sou, M.D.   US Carotid Duplex Bilateral  10/07/2011  *RADIOLOGY REPORT*  Clinical Data: History of Crohn's disease, smoker, hypertension  BILATERAL CAROTID DUPLEX ULTRASOUND  Technique: Pearline Cables scale imaging, color Doppler and duplex ultrasound was performed of bilateral carotid and vertebral arteries in the neck.  Comparison:  10/22/2005  Criteria:  Quantification of carotid stenosis is based on velocity parameters that correlate the residual  internal carotid diameter with NASCET-based stenosis levels, using the diameter of the distal internal carotid lumen as the denominator for stenosis measurement.  The following velocity measurements were obtained:                   PEAK SYSTOLIC/END DIASTOLIC RIGHT ICA:                        122/17cm/sec CCA:                        [id number] SYSTOLIC ICA/CCA RATIO:     [id number] DIASTOLIC ICA/CCA RATIO:    2.07 ECA:                        58cm/sec  LEFT ICA:                        66/18cm/sec CCA:                        [id number] SYSTOLIC ICA/CCA RATIO:     [id number] DIASTOLIC ICA/CCA RATIO:    1.94 ECA:                        46cm/sec  Findings:  RIGHT CAROTID ARTERY: Heterogeneous and scattered plaque formation with echogenic shadowing calcification throughout the right carotid  system.  Despite this, there is no hemodynamically significant right ICA stenosis, degree of narrowing less than 50%.  Proximal right ICA is tortuous.  RIGHT VERTEBRAL ARTERY:  Antegrade  LEFT CAROTID ARTERY: Heterogeneous left carotid system plaque formation to a lesser degree.  No hemodynamically significant left ICA stenosis, velocity elevation, or turbulent flow.  LEFT VERTEBRAL ARTERY:  Antegrade  IMPRESSION: Right greater than left carotid atherosclerosis.  No hemodynamically significant ICA stenosis by ultrasound on either side.  Original Report Authenticated By: Jerilynn Mages. Daryll Brod, M.D.   Dg Chest Port 1 View  10/07/2011  *RADIOLOGY REPORT*  Clinical Data: Follow up pneumonia  PORTABLE CHEST - 1 VIEW  Comparison: 10/02/2011  Findings: Cardiomediastinal silhouette is stable.  Coarse bilateral interstitial prominence with peripheral predominance again noted probable due to interstitial fibrosis. There is some residual hazy airspace disease probable residual infiltrate in the right lower lobe with mild improvement in aeration.  A vague nodular density in the right midlung measures 8 mm.  There might be a second nodular density in the right  upper lobe measures 6 mm.  Further evaluation with non emergent CT scan of the chest is recommended.  No pulmonary edema.  IMPRESSION:  Coarse bilateral interstitial prominence with peripheral predominance again noted probable due to interstitial fibrosis. There is some residual hazy airspace disease probable residual infiltrate in the right lower lobe with mild improvement in aeration.  A vague nodular density in the right midlung measures 8 mm.  There might be a second nodular density in the right upper lobe measures 6 mm.  Further evaluation with non emergent CT scan of the chest is recommended.  Original Report Authenticated By: Lahoma Crocker, M.D.   Dg Chest Port 1 View  10/02/2011  *RADIOLOGY REPORT*  Clinical Data: Cough and hypertension.  PORTABLE CHEST - 1 VIEW  Comparison: 06/18/2005  Findings: Coarse parenchymal lung disease present bilaterally, predominantly in a peripheral distribution.  Findings are suggestive of fibrotic lung disease.  Components of acute infiltrates cannot be excluded especially in the right lung.  No edema or pleural effusion identified.  Heart size and mediastinal contours are within normal limits.  IMPRESSION: Progressive coarse parenchymal disease bilaterally which is suggestive of potential progressive pulmonary fibrosis.  Component of acute infiltrate would be difficult to exclude, especially in the right lung.  Original Report Authenticated By: Azzie Roup, M.D.   Dg Swallowing Func-no Report  10/07/2011  CLINICAL DATA: dysphagia   FLUOROSCOPY FOR SWALLOWING FUNCTION STUDY:  Fluoroscopy was provided for swallowing function study, which was  administered by a speech pathologist.  Final results and recommendations  from this study are contained within the speech pathology report.     ECHO:Study Conclusions  - Left ventricle: The cavity size was normal. There was mild to moderate concentric hypertrophy. Systolic function was normal. The estimated ejection fraction was  55%. Carruthers motion was normal; there were no regional Spisak motion abnormalities. - Aortic valve: Cusp separation was mildly reduced. - Mitral valve: Calcified annulus. Mildly thickened, mildly calcified leaflets . - Atrial septum: No defect or patent foramen ovale was identified. - Pulmonary arteries: PA peak pressure: 5mm Hg (S). - Pericardium, extracardiac: Very small, primarily posterior pericardial effusion. Transthoracic echocardiography. M-mode, complete 2D, spectral Doppler, and color Doppler. Height: Height: 170.2cm. Height: 67in. Weight: Weight: 65.3kg. Weight: 143.7lb. Body mass index: BMI: 22.6kg/m^2. Body surface area: BSA: 1.77m^2. Patient status: Inpatient. Location: Bedside.      Microbiology: Recent Results (from the past 240 hour(s))  CLOSTRIDIUM  DIFFICILE BY PCR     Status: Abnormal   Collection Time   10/02/11  8:30 PM      Component Value Range Status Comment   C difficile by pcr POSITIVE (*) NEGATIVE  Final   STOOL CULTURE     Status: Normal   Collection Time   10/02/11  8:33 PM      Component Value Range Status Comment   Specimen Description STOOL   Final    Special Requests NONE   Final    Culture     Final    Value: NO SALMONELLA, SHIGELLA, CAMPYLOBACTER, OR YERSINIA ISOLATED     Note: REDUCED NORMAL FLORA PRESENT   Report Status 10/06/2011 FINAL   Final      Labs: Results for orders placed during the hospital encounter of 10/02/11 (from the past 48 hour(s))  CARBAMAZEPINE LEVEL, TOTAL     Status: Normal   Collection Time   10/08/11  9:48 AM      Component Value Range Comment   Carbamazepine Lvl 6.1  4.0 - 12.0 (ug/mL)   BASIC METABOLIC PANEL     Status: Abnormal   Collection Time   10/09/11  5:03 AM      Component Value Range Comment   Sodium 142  135 - 145 (mEq/L)    Potassium 3.9  3.5 - 5.1 (mEq/L)    Chloride 107  96 - 112 (mEq/L)    CO2 31  19 - 32 (mEq/L)    Glucose, Bld 85  70 - 99 (mg/dL)    BUN 9  6 - 23 (mg/dL)    Creatinine, Ser  1.07  0.50 - 1.35 (mg/dL)    Calcium 9.3  8.4 - 10.5 (mg/dL)    GFR calc non Af Amer 69 (*) >90 (mL/min)    GFR calc Af Amer 80 (*) >90 (mL/min)   CBC     Status: Abnormal   Collection Time   10/09/11  5:03 AM      Component Value Range Comment   WBC 6.9  4.0 - 10.5 (K/uL)    RBC 2.98 (*) 4.22 - 5.81 (MIL/uL)    Hemoglobin 10.1 (*) 13.0 - 17.0 (g/dL)    HCT 30.4 (*) 39.0 - 52.0 (%)    MCV 102.0 (*) 78.0 - 100.0 (fL)    MCH 33.9  26.0 - 34.0 (pg)    MCHC 33.2  30.0 - 36.0 (g/dL) CORRECTED FOR COLD AGGLUTININS   RDW 15.1  11.5 - 15.5 (%)    Platelets 189  150 - 400 (K/uL)      HPI : The patient is a 71 year old man with a past medical history significant for MS, hypertension, and chronic pain syndrome, who presented to the emergency department on 10/02/2011 with a chief complaint of generalized weakness, confusion, and worsening diarrhea. In the emergency department, he was noted to be afebrile and hemodynamically stable. His serum potassium was 2.3. His hemoglobin was 10.0. His chest x-ray revealed progressive course parenchymal disease bilaterally suggestive of progressive pulmonary fibrosis although a component of an acute infiltrate could not be excluded in the right lung. The CT scan of his head revealed advanced small vessel disease and old white matter infarcts. He was admitted for further evaluation and management.  HOSPITAL COURSE: The patient was started on IV fluids for hydration. His potassium chloride was repleted via the IV fluids and orally. Given his recent treatment with a Z-Pak, it was thought that his diarrhea may have been secondary  to C. difficile colitis. Stool studies were ordered. The C. difficile PCR was positive. He was, therefore started on Flagyl. Also, he was started on IV Avelox for possible pneumonia. He was also started on Rocephin for what appeared to be a urinary tract infection, although the patient has a chronic indwelling Foley catheter. His blood magnesium was  assessed and it was within normal limits at 2.2. He had a junctional rhythm on the EKG. Cardiac enzymes were ordered and they were not indicative of an acute myocardial infarction, however, his total CK did range from 500-800 and with normal troponin I's. With hydration, his hemoglobin fell from 10.0 to 8.6 to 7.7. There was no evidence of acute GI or GU bleeding. The decrease in his hemoglobin was thought to be secondary to hemodilution. He was transfused 2 units of packed red blood cells and his hemoglobin improved to 9.9 appropriately. Once the IV fluids were tapered down, his hemoglobin was maintained at 10.1-10.3. He was also noted to have a macrocytosis. Vitamin B12 was ordered and it was within normal limits. The patient will need to have an outpatient GI evaluation electively. The referral will be deferred to his outpatient physicians.  The patient has audible crackles which appear to be chronic. However, earlier on in the hospitalization, it was thought that the patient may have superimposed pulmonary edema following the transfusions. He was given Lasix empirically. A 2-D echocardiogram was ordered. It revealed a preserved LV function and no mention of diastolic dysfunction. His followup chest x-ray revealed coarsening of the interstitium without obvious edema. It is likely that the patient has pulmonary fibrosis which may be mimicking congestive heart failure given the pulmonary exams. The followup chest x-ray also revealed vague nodular density in the right upper lobe. An outpatient followup CT scan is recommended in 6 months for interval assessment of both the fibrotic changes in the nodule. He was treated with bronchodilator therapy which helped.  The patient's mental status waxed and waned during the first few days of hospitalization. Because of his history of multiple sclerosis, neurologist, Dr. Merlene Laughter was consulted. A number of studies were ordered. The MRI of his brain revealed a left  cerebellar linear area that was either an artifact or an infarct. The patient's EEG was unremarkable except for some mild slowing. There is no evidence of epileptiform activity. His carotid ultrasound revealed no significant ICA stenosis. His carbamazepine level was 6.1, therapeutic. Later, his thyroid function was assessed. The findings were consistent with newly diagnosed hypothyroidism. He was started on thyroid replacement therapy.  The patient's blood pressure had increased over the course of the hospitalization. Therefore, all of his medications for treatment of hypertension were eventually restarted. Following the restart of his medications, his blood pressure improved. Next  The physical therapist evaluated the patient. Given his deconditioned state, she recommended skilled nursing facility placement for rehabilitation. Both the patient and his family agreed.  He was also evaluated by the speech therapist. Based on the evaluation, the patient had significant dysphagia. She recommended a dysphagia 1 (pured) diet with honey thickened liquid. He will need to have a followup evaluation by the speech therapist at the skilled nursing facility.  Overall, the patient improved from a cognitive standpoint. It was likely that his encephalopathy/altered mental status was secondary to a combination of etiologies as discussed above. Hopefully, with the start of treatment of his hypothyroidism, he will improve. At the time of hospital discharge, he had no further diarrhea. He received 6 days of  Flagyl in the hospital and was discharged to home on 8 more days of therapy. Antibiotic therapy was discontinued after 6 days as there appeared to be no active pulmonary infection and no active urinary tract infection.    Discharge Exam: Blood pressure 144/83, pulse 65, temperature 96.8 F (36 C), temperature source Oral, resp. rate 20, height 5\' 7"  (1.702 m), weight 65.227 kg (143 lb 12.8 oz), SpO2 97.00%.  Lungs:  Fine crackles anteriorly. Heart: Distant S1, S2, with no murmurs rubs or gallops. Abdomen: Positive bowel sounds, soft, nontender, nondistended. Extremities: No pedal edema. Neurologic: The patient is alert and oriented x2. Cranial nerves II through XII are grossly intact. His speech is strong and clear.    Discharge Orders    Future Orders Please Complete By Expires   Diet - low sodium heart healthy      Increase activity slowly      Increase activity slowly      Discharge instructions      Comments:   PT SHOULD BE ON A PUREED DIET WITH HONEY-THICKENED LIQUIDS. HE WILL NEED TO BE RE-EVALUATED BY THE SPEECH THERAPIST AT THE SNF.      Follow-up Information    Follow up with MANN,BENJAMIN L, PA .         Discharge time: 45 minutes.   Signed: Tregan Read 10/09/2011, 1:35 PM

## 2011-10-09 NOTE — Procedures (Signed)
NAMEDEMONTA, BUMGARDNER                  ACCOUNT NO.:  [id number]  MEDICAL RECORD NO.:  [id number]  LOCATION:  [id number]                          FACILITY:  [facility]  PHYSICIAN:  Treanna Dumler A. Merlene Laughter, M.D. DATE OF BIRTH:  1940-11-14  DATE OF PROCEDURE: DATE OF DISCHARGE:                             EEG INTERPRETATION   HISTORY:  This is a 71 year old man who presents with intermittent episodes of unresponsiveness and altered mentation.  The study is being done to evaluate for seizures.  MEDICATIONS:  Aspirin, Tegretol, Rocephin, Catapres, diazepam, Lortab, Flagyl, Atrovent, Proventil, Avelox, potassium, verapamil.  ANALYSIS:  This is a 16 channel recording using standard 10/20 measurements conducted for approximately 20 minutes.  There is a posterior dominant rhythm that gets as high as 7.5 Hz which attenuates with eye opening.  There is beta activity observed in the frontal areas. Awake and drowsy activities are recorded.  Photic stimulation is carried out without abnormal changes in the background activity.  There is no focal or lateralized slowing.  There is no epileptiform activity.  IMPRESSION:  This is a essentially unremarkable recording.  There is some mild generalized slowing indicating mild encephalopathy, but no epileptiform activities.     Sharmaine Bain A. Merlene Laughter, M.D.     KAD/MEDQ  D:  10/08/2011  T:  10/09/2011  Job:  [id number]

## 2011-10-10 DIAGNOSIS — A0472 Enterocolitis due to Clostridium difficile, not specified as recurrent: Secondary | ICD-10-CM | POA: Diagnosis not present

## 2011-10-10 NOTE — Progress Notes (Signed)
[name], [name]                  ACCOUNT NO.:  [id number]  MEDICAL RECORD NO.:  [id number]  LOCATION:  [id number]                          FACILITY:  [facility]  PHYSICIAN:  Darthula Desa A. Merlene Laughter, M.D. DATE OF BIRTH:  1941-04-08  DATE OF PROCEDURE: DATE OF DISCHARGE:  10/09/2011                                PROGRESS NOTE   He has not many complaints.  Again, he continues to improve modestly. Today, he is awake and now seen to the hospital, seems more lucid and less confused.  He has less nonsensical speech.  He seem to have done well with reduced dose of clonidine.  He has been started on levothyroxine without problems.  He speaks in full sentences.  He does follow commands well.  Cranial nerves are intact and he has antigravity strength throughout.  ASSESSMENT AND PLAN:  Encephalopathy, improved.  Continue with the current care.  Tegretol level came back at 6.1.  We will continue with current does, seem to be stable regarding his baseline tremors and multiple sclerosis.  We will continue to follow the patient.     Matina Rodier A. Merlene Laughter, M.D.     KAD/MEDQ  D:  10/09/2011  T:  10/10/2011  Job:  [id number]

## 2011-10-11 ENCOUNTER — Emergency Department (HOSPITAL_COMMUNITY): Payer: Medicare Other

## 2011-10-11 ENCOUNTER — Encounter (HOSPITAL_COMMUNITY): Payer: Self-pay

## 2011-10-11 ENCOUNTER — Emergency Department (HOSPITAL_COMMUNITY)
Admission: EM | Admit: 2011-10-11 | Discharge: 2011-10-11 | Disposition: A | Discharge status: Skilled Nursing Facility | Payer: Medicare Other | Attending: Emergency Medicine | Admitting: Emergency Medicine

## 2011-10-11 ENCOUNTER — Other Ambulatory Visit: Payer: Self-pay

## 2011-10-11 DIAGNOSIS — R0602 Shortness of breath: Secondary | ICD-10-CM | POA: Insufficient documentation

## 2011-10-11 DIAGNOSIS — J984 Other disorders of lung: Secondary | ICD-10-CM | POA: Diagnosis not present

## 2011-10-11 DIAGNOSIS — R5381 Other malaise: Secondary | ICD-10-CM | POA: Insufficient documentation

## 2011-10-11 DIAGNOSIS — G35 Multiple sclerosis: Secondary | ICD-10-CM | POA: Diagnosis not present

## 2011-10-11 DIAGNOSIS — E039 Hypothyroidism, unspecified: Secondary | ICD-10-CM | POA: Insufficient documentation

## 2011-10-11 DIAGNOSIS — I10 Essential (primary) hypertension: Secondary | ICD-10-CM | POA: Diagnosis not present

## 2011-10-11 DIAGNOSIS — R259 Unspecified abnormal involuntary movements: Secondary | ICD-10-CM | POA: Insufficient documentation

## 2011-10-11 DIAGNOSIS — R05 Cough: Secondary | ICD-10-CM | POA: Insufficient documentation

## 2011-10-11 DIAGNOSIS — R918 Other nonspecific abnormal finding of lung field: Secondary | ICD-10-CM | POA: Diagnosis not present

## 2011-10-11 DIAGNOSIS — R059 Cough, unspecified: Secondary | ICD-10-CM | POA: Insufficient documentation

## 2011-10-11 DIAGNOSIS — J8410 Pulmonary fibrosis, unspecified: Secondary | ICD-10-CM | POA: Insufficient documentation

## 2011-10-11 DIAGNOSIS — R531 Weakness: Secondary | ICD-10-CM

## 2011-10-11 DIAGNOSIS — I739 Peripheral vascular disease, unspecified: Secondary | ICD-10-CM | POA: Insufficient documentation

## 2011-10-11 DIAGNOSIS — J3489 Other specified disorders of nose and nasal sinuses: Secondary | ICD-10-CM | POA: Insufficient documentation

## 2011-10-11 DIAGNOSIS — Z87442 Personal history of urinary calculi: Secondary | ICD-10-CM | POA: Insufficient documentation

## 2011-10-11 DIAGNOSIS — M129 Arthropathy, unspecified: Secondary | ICD-10-CM | POA: Insufficient documentation

## 2011-10-11 DIAGNOSIS — I6529 Occlusion and stenosis of unspecified carotid artery: Secondary | ICD-10-CM | POA: Insufficient documentation

## 2011-10-11 DIAGNOSIS — R0902 Hypoxemia: Secondary | ICD-10-CM | POA: Insufficient documentation

## 2011-10-11 DIAGNOSIS — G9340 Encephalopathy, unspecified: Secondary | ICD-10-CM | POA: Insufficient documentation

## 2011-10-11 DIAGNOSIS — R5383 Other fatigue: Secondary | ICD-10-CM | POA: Diagnosis not present

## 2011-10-11 DIAGNOSIS — Z8701 Personal history of pneumonia (recurrent): Secondary | ICD-10-CM | POA: Insufficient documentation

## 2011-10-11 DIAGNOSIS — F29 Unspecified psychosis not due to a substance or known physiological condition: Secondary | ICD-10-CM | POA: Insufficient documentation

## 2011-10-11 HISTORY — DX: Encephalopathy, unspecified: G93.40

## 2011-10-11 HISTORY — DX: Other specified cardiac arrhythmias: I49.8

## 2011-10-11 HISTORY — DX: Enterocolitis due to Clostridium difficile, not specified as recurrent: A04.72

## 2011-10-11 HISTORY — DX: Pneumonia, unspecified organism: J18.9

## 2011-10-11 HISTORY — DX: Hyperosmolality and hypernatremia: E87.0

## 2011-10-11 HISTORY — DX: Anemia, unspecified: D64.9

## 2011-10-11 HISTORY — DX: Hypokalemia: E87.6

## 2011-10-11 HISTORY — DX: Urinary tract infection, site not specified: N39.0

## 2011-10-11 LAB — BASIC METABOLIC PANEL
BUN: 16 mg/dL (ref 6–23)
Creatinine, Ser: 1.29 mg/dL (ref 0.50–1.35)
GFR calc Af Amer: 64 mL/min — ABNORMAL LOW (ref >90)
GFR calc non Af Amer: 55 mL/min — ABNORMAL LOW (ref >90)
Potassium: 4.3 mEq/L (ref 3.5–5.1)

## 2011-10-11 LAB — CBC
Hemoglobin: 10.7 g/dL — ABNORMAL LOW (ref 13.0–17.0)
MCH: 34.9 pg — ABNORMAL HIGH (ref 26.0–34.0)
MCHC: 33.9 g/dL (ref 30.0–36.0)
RDW: 14.9 % (ref 11.5–15.5)

## 2011-10-11 LAB — URINALYSIS, ROUTINE W REFLEX MICROSCOPIC
Bilirubin Urine: NEGATIVE
Ketones, ur: NEGATIVE mg/dL
Nitrite: NEGATIVE
Urobilinogen, UA: 0.2 mg/dL (ref 0.0–1.0)

## 2011-10-11 LAB — DIFFERENTIAL
Basophils Absolute: 0 10*3/uL (ref 0.0–0.1)
Basophils Relative: 1 % (ref 0–1)
Eosinophils Absolute: 0.2 10*3/uL (ref 0.0–0.7)
Monocytes Relative: 5 % (ref 3–12)
Neutrophils Relative %: 59 % (ref 43–77)

## 2011-10-11 NOTE — ED Notes (Signed)
Pt was undressed and placed into a hospital gown. Pt was placed on the heart monitor. EKG was completed and given to MD. RN with pt at this time. Pt wife here with pt. NAD noted at this time.

## 2011-10-11 NOTE — ED Notes (Signed)
[facility] staff reports pt woke up this morning congested, coughing and o2 sat 75% on room air.  Staff reports they sat him up, administered combivent inhaler and did incentive spirometry and sat increased to 86%.  Reports put pt on 02 at 2 liters then increased to 4Liters.  Pt was 90 % on 4liters.  Pt alert and oriented.  Pt's speech sounds a little slurred, has history of dysphagia.  Staff also reports pt's bp manually was 70/40, hr 65 and oral temp of 96.6.  Staff says pt received all of his regular medications this am including clonidine.  Pt alert and oriented, denies pain.

## 2011-10-11 NOTE — ED Provider Notes (Signed)
History   Scribed for Jordan Christen, MD, the patient was seen in [place]. The chart was scribed by Clarisa Fling. The patients care was started at 10:06 AM.   CSN: [id number]  Arrival date & time 10/11/11  0907   First MD Initiated Contact with Patient 10/11/11 [phone]      Chief Complaint  Patient presents with  . Shortness of Breath  . Cough  . Hypotension    (Consider location/radiation/quality/duration/timing/severity/associated sxs/prior treatment) HPI JACOLBY BROW is a 71 y.o. male with multiple illnesses including MS (for forty years) who presents to the Emergency Department complaining of shortness of breath. Per wife, pt had cold and difficulty breathing on December 23. Also notes that pt was unable to walk a few days after Christmas although he is typically ambulatory with cane. Pt also had diarrhea that was diagnosed as C.diff. Pt was BIBA on Jan 3 and admitted until Jan 10 (two days ago). Pt was then placed in [facility] due to weakness. Wife notes that in the last day pt has been "acting funny" and confused. Wife also states that pt has pneumonia.  Pt states he does not feel good but denies any current pain.  Pt has also recently been placed on thyroid meds.  Per [facility],  pt woke up this morning congested, coughing and o2 sat 75% on room air. Staff reports they sat him up, administered combivent inhaler and did incentive spirometry and sat increased to 86%. Reports put pt on 02 at 2 liters then increased to 4Liters. Pt was 90 % on 4liters.  Pt's speech sounds a little slurred, has history of dysphagia. Staff also reports pt's bp manually was 70/40, hr 65 and oral temp of 96.6. Staff says pt received all of his regular medications this am including clonidine.  o2 improved with Oxygen.  There are no other associated symptoms and no other alleviating or aggravating factors.  Level V caveat for urgent need for intervention    Past Medical History  Diagnosis Date  . Arthritis     . MS (multiple sclerosis)   . Insomnia   . HTN (hypertension)   . PVD (peripheral vascular disease)   . Carotid artery stenosis   . TIA (transient ischemic attack)   . Kidney stones   . HTN (hypertension), malignant 10/06/2011  . Chronic indwelling foley catheter 10/06/2011  . Pulmonary fibrosis 10/06/2011  . Dysphagia 10/07/2011  . Tremors of nervous system 10/08/2011  . Hypothyroidism 10/08/2011  . Pulmonary nodule 10/08/2011  . C. difficile colitis   . Pneumonia   . Encephalopathy   . Urinary tract infection   . Tremors of nervous system   . Hypokalemia   . Junctional rhythm   . Anemia   . Hypernatremia     Past Surgical History  Procedure Date  . Cholecystectomy   . Inguinal hernia repair 1971    bilateral  . Back surgery 1976/1979  . Colonoscopy 11/2004    Dr. Sharol Roussel sessile polyp splenic flexure, 57mm sessile polyp desc colon, tubulovillous adenoma (bx not removed)  . Colonoscopy 01/2005    poor prep, polyp could not be found  . Colonoscopy 05/2005    with EMR, polypectomy Dr. Olegario Messier, bx showed high grade dysplasia, partially resected  . Colonoscopy 09/2005    Dr. Arsenio Loader, [place] ink tattooing, four villous colon polyp (3 had been missed on previous colonoscopies due to limitations of procedures  . Colon surgery 09/2005    four tubular adenomas, no  cancer  . Appendectomy 09/2005    at time of left hemicolectomy  . Colonoscopy 09/2006    normal TI, no polyps  . Colonoscopy 10/2007    Dr. Imogene Burn distal mammillations, benign bx, normal TI, random bx neg for microscopic colitis    Family History  Problem Relation Age of Onset  . Colon cancer Neg Hx   . Cirrhosis Brother     etoh  . Stroke Mother 38  . Coronary artery disease Father 80  . Heart attack Brother     History  Substance Use Topics  . Smoking status: Former Smoker    Types: Cigarettes  . Smokeless tobacco: Never Used   Comment: quit remote  . Alcohol Use: No      Review of Systems   Unable to perform ROS: Other  Respiratory: Positive for cough and shortness of breath.   All other systems reviewed and are negative.    Allergies  Tetracyclines & related  Home Medications   Current Outpatient Rx  Name Route Sig Dispense Refill  . IPRATROPIUM-ALBUTEROL 18-103 MCG/ACT IN AERO Inhalation Inhale 2 puffs into the lungs 3 (three) times daily. 1 Inhaler 1  . ASPIRIN EC 81 MG PO TBEC Oral Take 81 mg by mouth daily.      Marland Kitchen CLONIDINE HCL 0.2 MG PO TABS  1 tablet 2 (two) times daily.     Marland Kitchen DIAZEPAM 5 MG PO TABS Oral Take 1 tablet (5 mg total) by mouth 2 (two) times daily. 30 tablet 0  . HYDROCHLOROTHIAZIDE 25 MG PO TABS  1 tablet daily.     Marland Kitchen HYDROCODONE-ACETAMINOPHEN 7.5-650 MG PO TABS Oral Take 1 tablet by mouth every 6 (six) hours as needed. pain 30 tablet 0  . LEVOTHYROXINE SODIUM 50 MCG PO TABS Oral Take 1 tablet (50 mcg total) by mouth daily before breakfast. 30 tablet 2  . METRONIDAZOLE 500 MG PO TABS Oral Take 1 tablet (500 mg total) by mouth every 8 (eight) hours. 24 tablet 0  . POTASSIUM CHLORIDE CRYS ER 20 MEQ PO TBCR Oral Take 1 tablet (20 mEq total) by mouth daily.    . TEGRETOL 200 MG PO TABS  1 tablet 2 (two) times daily.     Marland Kitchen VERAPAMIL HCL ER 180 MG PO TBCR  1 tablet 2 (two) times daily.       BP 109/69  Pulse 58  Temp(Src) 97.9 F (36.6 C) (Oral)  Resp 20  Ht 5\' 7"  (1.702 m)  Wt 138 lb (62.596 kg)  BMI 21.61 kg/m2  SpO2 100%  Physical Exam  Constitutional: He appears well-developed and well-nourished.  Non-toxic appearance. He does not have a sickly appearance.       Pale   HENT:  Head: Normocephalic and atraumatic.  Eyes: Conjunctivae, EOM and lids are normal. Pupils are equal, round, and reactive to light.  Neck: Trachea normal, normal range of motion and full passive range of motion without pain. Neck supple.  Cardiovascular: Regular rhythm and normal heart sounds.   Pulmonary/Chest: Effort normal and breath sounds normal. No respiratory  distress.  Abdominal: Soft. Normal appearance. He exhibits no distension. There is no tenderness. There is no rebound and no CVA tenderness.  Musculoskeletal: Normal range of motion.  Neurological: He has normal strength.       Alert but confused  Pt did not answer questions appropriately but wife believes he is being facetious   Skin: Skin is warm, dry and intact. No rash noted.  ED Course  Procedures (including critical care time)   Labs Reviewed  CBC  DIFFERENTIAL  BASIC METABOLIC PANEL  URINALYSIS, ROUTINE W REFLEX MICROSCOPIC   No results found.   No diagnosis found.  DIAGNOSTIC STUDIES: Oxygen Saturation is 100% on [place], normal by my interpretation.    COORDINATION OF CARE: 10:06am:  - Patient evaluated by ED physician, CT Chest, DG Chest, CBC, Diff, BMP, UA ordered  Date: 10/11/2011  Rate: 62  Rhythm: normal sinus rhythm  QRS Axis: normal  Intervals: normal  ST/T Wave abnormalities: normal  Conduction Disutrbances:none  Narrative Interpretation:   Old EKG Reviewed: unchanged Prolonged QT  LABS Results for orders placed during the hospital encounter of 10/11/11  CBC      Component Value Range   WBC 6.2  4.0 - 10.5 (K/uL)   RBC 3.07 (*) 4.22 - 5.81 (MIL/uL)   Hemoglobin 10.7 (*) 13.0 - 17.0 (g/dL)   HCT 31.6 (*) 39.0 - 52.0 (%)   MCV 102.9 (*) 78.0 - 100.0 (fL)   MCH 34.9 (*) 26.0 - 34.0 (pg)   MCHC 33.9  30.0 - 36.0 (g/dL)   RDW 14.9  11.5 - 15.5 (%)   Platelets 224  150 - 400 (K/uL)  DIFFERENTIAL      Component Value Range   Neutrophils Relative 59  43 - 77 (%)   Neutro Abs 3.6  1.7 - 7.7 (K/uL)   Lymphocytes Relative 34  12 - 46 (%)   Lymphs Abs 2.1  0.7 - 4.0 (K/uL)   Monocytes Relative 5  3 - 12 (%)   Monocytes Absolute 0.3  0.1 - 1.0 (K/uL)   Eosinophils Relative 2  0 - 5 (%)   Eosinophils Absolute 0.2  0.0 - 0.7 (K/uL)   Basophils Relative 1  0 - 1 (%)   Basophils Absolute 0.0  0.0 - 0.1 (K/uL)  BASIC METABOLIC PANEL      Component Value  Range   Sodium 137  135 - 145 (mEq/L)   Potassium 4.3  3.5 - 5.1 (mEq/L)   Chloride 101  96 - 112 (mEq/L)   CO2 31  19 - 32 (mEq/L)   Glucose, Bld 84  70 - 99 (mg/dL)   BUN 16  6 - 23 (mg/dL)   Creatinine, Ser 1.29  0.50 - 1.35 (mg/dL)   Calcium 9.6  8.4 - 10.5 (mg/dL)   GFR calc non Af Amer 55 (*) >90 (mL/min)   GFR calc Af Amer 64 (*) >90 (mL/min)  URINALYSIS, ROUTINE W REFLEX MICROSCOPIC      Component Value Range   Color, Urine YELLOW  YELLOW    APPearance HAZY (*) CLEAR    Specific Gravity, Urine 1.010  1.005 - 1.030    pH >9.0 (*) 5.0 - 8.0    Glucose, UA NEGATIVE  NEGATIVE (mg/dL)   Hgb urine dipstick NEGATIVE  NEGATIVE    Bilirubin Urine NEGATIVE  NEGATIVE    Ketones, ur NEGATIVE  NEGATIVE (mg/dL)   Protein, ur NEGATIVE  NEGATIVE (mg/dL)   Urobilinogen, UA 0.2  0.0 - 1.0 (mg/dL)   Nitrite NEGATIVE  NEGATIVE    Leukocytes, UA NEGATIVE  NEGATIVE    Radiology: DG Chest 2 View. Reviewed by me. IMPRESSION: Lung fibrosis. Rounded areas of density noted on the earlier study are less focal on the frontal and lateral views are consistent with superimposed areas of fibrosis. No convincing infiltrate. Original Report Authenticated By: Lasandra Beech, M.D.  DG Chest Portable 1 View.  Reviewed by me. IMPRESSION: 1. Hyperinflation with overall improved aeration. Coarsened/thickened interstitium described on prior exams is improved to resolved. Question whether this represented an atypical appearance of interstitial edema or an atypical infection process. 2. Nodular densities projecting over the mid lungs bilaterally. Not readily apparent on the prior exam. Question developing nodular airspace disease or less likely infectious nodules. Potential clinical strategies would include follow-up with PA and lateral radiographs, versus further characterization with chest CT. Original Report Authenticated By: Areta Haber, M.D.    MDM  Difficult to ascertain cause of his problems  other than hypoxemia. Feeling much better with nasal oxygen. Testing shows improvement of his chest x-ray.  We'll discharge home with supplemental oxygen  I personally performed the services described in this documentation, which was scribed in my presence. The recorded information has been reviewed and considered.         Jordan Christen, MD 10/11/11 1436

## 2011-10-13 DIAGNOSIS — E039 Hypothyroidism, unspecified: Secondary | ICD-10-CM | POA: Diagnosis not present

## 2011-10-13 DIAGNOSIS — E876 Hypokalemia: Secondary | ICD-10-CM | POA: Diagnosis not present

## 2011-10-13 DIAGNOSIS — G35 Multiple sclerosis: Secondary | ICD-10-CM | POA: Diagnosis not present

## 2011-10-13 DIAGNOSIS — A0472 Enterocolitis due to Clostridium difficile, not specified as recurrent: Secondary | ICD-10-CM | POA: Diagnosis not present

## 2011-10-15 DIAGNOSIS — J8410 Pulmonary fibrosis, unspecified: Secondary | ICD-10-CM | POA: Diagnosis not present

## 2011-10-15 DIAGNOSIS — G35 Multiple sclerosis: Secondary | ICD-10-CM | POA: Diagnosis not present

## 2011-10-15 DIAGNOSIS — E876 Hypokalemia: Secondary | ICD-10-CM | POA: Diagnosis not present

## 2011-10-16 ENCOUNTER — Other Ambulatory Visit (HOSPITAL_BASED_OUTPATIENT_CLINIC_OR_DEPARTMENT_OTHER): Payer: Self-pay | Admitting: Internal Medicine

## 2011-10-16 ENCOUNTER — Ambulatory Visit (HOSPITAL_COMMUNITY)
Admission: RE | Admit: 2011-10-16 | Discharge: 2011-10-16 | Disposition: A | Discharge status: Home / Self Care | Payer: Medicare Other | Source: Ambulatory Visit | Attending: Internal Medicine | Admitting: Internal Medicine

## 2011-10-16 DIAGNOSIS — R0989 Other specified symptoms and signs involving the circulatory and respiratory systems: Secondary | ICD-10-CM | POA: Diagnosis not present

## 2011-10-16 DIAGNOSIS — J984 Other disorders of lung: Secondary | ICD-10-CM | POA: Diagnosis not present

## 2011-10-16 DIAGNOSIS — J438 Other emphysema: Secondary | ICD-10-CM | POA: Diagnosis not present

## 2011-10-17 DIAGNOSIS — R911 Solitary pulmonary nodule: Secondary | ICD-10-CM | POA: Diagnosis not present

## 2011-10-17 LAB — CLOSTRIDIUM DIFFICILE CULTURE-FECAL

## 2011-10-17 NOTE — Progress Notes (Signed)
Pt was given instructions, presciptions, and care notes.  He verbalizes understanding.  Pt left the floor without being transferred ambulating, last seen in stable condition.

## 2011-10-20 DIAGNOSIS — J8410 Pulmonary fibrosis, unspecified: Secondary | ICD-10-CM | POA: Diagnosis not present

## 2011-10-20 DIAGNOSIS — A0472 Enterocolitis due to Clostridium difficile, not specified as recurrent: Secondary | ICD-10-CM | POA: Diagnosis not present

## 2011-10-20 DIAGNOSIS — E876 Hypokalemia: Secondary | ICD-10-CM | POA: Diagnosis not present

## 2011-10-21 ENCOUNTER — Other Ambulatory Visit (HOSPITAL_BASED_OUTPATIENT_CLINIC_OR_DEPARTMENT_OTHER): Payer: Self-pay | Admitting: Internal Medicine

## 2011-10-21 DIAGNOSIS — J8410 Pulmonary fibrosis, unspecified: Secondary | ICD-10-CM

## 2011-10-22 ENCOUNTER — Other Ambulatory Visit (HOSPITAL_COMMUNITY): Payer: Medicare Other

## 2011-10-22 ENCOUNTER — Ambulatory Visit (HOSPITAL_COMMUNITY)
Admission: RE | Admit: 2011-10-22 | Discharge: 2011-10-22 | Disposition: A | Discharge status: Home / Self Care | Payer: Medicare Other | Source: Ambulatory Visit | Attending: Internal Medicine | Admitting: Internal Medicine

## 2011-10-22 DIAGNOSIS — R918 Other nonspecific abnormal finding of lung field: Secondary | ICD-10-CM | POA: Insufficient documentation

## 2011-10-22 DIAGNOSIS — R0602 Shortness of breath: Secondary | ICD-10-CM | POA: Diagnosis not present

## 2011-10-22 DIAGNOSIS — R059 Cough, unspecified: Secondary | ICD-10-CM | POA: Insufficient documentation

## 2011-10-22 DIAGNOSIS — J8410 Pulmonary fibrosis, unspecified: Secondary | ICD-10-CM

## 2011-10-22 DIAGNOSIS — R05 Cough: Secondary | ICD-10-CM | POA: Insufficient documentation

## 2011-10-22 MED ORDER — IOHEXOL 300 MG/ML  SOLN
80.0000 mL | Freq: Once | INTRAMUSCULAR | Status: AC | PRN
  Administered 2011-10-22: 80 mL via INTRAVENOUS

## 2011-10-27 ENCOUNTER — Other Ambulatory Visit (HOSPITAL_BASED_OUTPATIENT_CLINIC_OR_DEPARTMENT_OTHER): Payer: Self-pay | Admitting: Internal Medicine

## 2011-10-27 ENCOUNTER — Ambulatory Visit (HOSPITAL_COMMUNITY)
Admission: RE | Admit: 2011-10-27 | Discharge: 2011-10-27 | Disposition: A | Discharge status: Skilled Nursing Facility | Payer: Medicare Other | Source: Skilled Nursing Facility | Attending: Internal Medicine | Admitting: Internal Medicine

## 2011-10-27 DIAGNOSIS — R937 Abnormal findings on diagnostic imaging of other parts of musculoskeletal system: Secondary | ICD-10-CM | POA: Insufficient documentation

## 2011-10-27 DIAGNOSIS — M19049 Primary osteoarthritis, unspecified hand: Secondary | ICD-10-CM | POA: Diagnosis not present

## 2011-10-27 DIAGNOSIS — M25539 Pain in unspecified wrist: Secondary | ICD-10-CM | POA: Insufficient documentation

## 2011-10-27 DIAGNOSIS — M1990 Unspecified osteoarthritis, unspecified site: Secondary | ICD-10-CM

## 2011-10-27 DIAGNOSIS — M25439 Effusion, unspecified wrist: Secondary | ICD-10-CM | POA: Insufficient documentation

## 2011-10-27 DIAGNOSIS — J8410 Pulmonary fibrosis, unspecified: Secondary | ICD-10-CM | POA: Diagnosis not present

## 2011-10-27 DIAGNOSIS — A0472 Enterocolitis due to Clostridium difficile, not specified as recurrent: Secondary | ICD-10-CM | POA: Diagnosis not present

## 2011-10-27 DIAGNOSIS — M19039 Primary osteoarthritis, unspecified wrist: Secondary | ICD-10-CM | POA: Diagnosis not present

## 2011-10-27 DIAGNOSIS — G35 Multiple sclerosis: Secondary | ICD-10-CM | POA: Diagnosis not present

## 2011-10-27 DIAGNOSIS — E876 Hypokalemia: Secondary | ICD-10-CM | POA: Diagnosis not present

## 2011-10-29 DIAGNOSIS — J8410 Pulmonary fibrosis, unspecified: Secondary | ICD-10-CM | POA: Diagnosis not present

## 2011-10-29 DIAGNOSIS — I739 Peripheral vascular disease, unspecified: Secondary | ICD-10-CM | POA: Diagnosis not present

## 2011-11-03 DIAGNOSIS — A0472 Enterocolitis due to Clostridium difficile, not specified as recurrent: Secondary | ICD-10-CM | POA: Diagnosis not present

## 2011-11-03 DIAGNOSIS — E876 Hypokalemia: Secondary | ICD-10-CM | POA: Diagnosis not present

## 2011-11-03 DIAGNOSIS — J8410 Pulmonary fibrosis, unspecified: Secondary | ICD-10-CM | POA: Diagnosis not present

## 2011-11-04 ENCOUNTER — Other Ambulatory Visit (HOSPITAL_BASED_OUTPATIENT_CLINIC_OR_DEPARTMENT_OTHER): Payer: Self-pay | Admitting: Internal Medicine

## 2011-11-06 ENCOUNTER — Other Ambulatory Visit (HOSPITAL_BASED_OUTPATIENT_CLINIC_OR_DEPARTMENT_OTHER): Payer: Self-pay | Admitting: Internal Medicine

## 2011-11-06 ENCOUNTER — Ambulatory Visit (HOSPITAL_COMMUNITY)
Admission: RE | Admit: 2011-11-06 | Discharge: 2011-11-06 | Disposition: A | Discharge status: Home / Self Care | Payer: Medicare Other | Source: Ambulatory Visit | Attending: Physician Assistant | Admitting: Physician Assistant

## 2011-11-06 ENCOUNTER — Ambulatory Visit (HOSPITAL_COMMUNITY)
Admission: RE | Admit: 2011-11-06 | Discharge: 2011-11-06 | Disposition: A | Discharge status: Home / Self Care | Payer: PRIVATE HEALTH INSURANCE | Source: Ambulatory Visit | Attending: Internal Medicine | Admitting: Internal Medicine

## 2011-11-06 DIAGNOSIS — R1310 Dysphagia, unspecified: Secondary | ICD-10-CM | POA: Diagnosis not present

## 2011-11-06 NOTE — Procedures (Signed)
Modified Barium Swallow Procedure Note/ Outpatient Patient Details  Name: Jordan Ward MRN: [id number] Date of Birth: 01-Nov-1940  Today's Date: 11/06/2011 Time: 1100-1130 Time Calculation (min): 30 min  Past Medical History:  Past Medical History  Diagnosis Date  . Arthritis   . MS (multiple sclerosis)   . Insomnia   . HTN (hypertension)   . PVD (peripheral vascular disease)   . Carotid artery stenosis   . TIA (transient ischemic attack)   . Kidney stones   . HTN (hypertension), malignant 10/06/2011  . Chronic indwelling foley catheter 10/06/2011  . Pulmonary fibrosis 10/06/2011  . Dysphagia 10/07/2011  . Tremors of nervous system 10/08/2011  . Hypothyroidism 10/08/2011  . Pulmonary nodule 10/08/2011  . C. difficile colitis   . Pneumonia   . Encephalopathy   . Urinary tract infection   . Tremors of nervous system   . Hypokalemia   . Junctional rhythm   . Anemia   . Hypernatremia    Past Surgical History:  Past Surgical History  Procedure Date  . Cholecystectomy   . Inguinal hernia repair 1971    bilateral  . Back surgery 1976/1979  . Colonoscopy 11/2004    Dr. Sharol Roussel sessile polyp splenic flexure, 56mm sessile polyp desc colon, tubulovillous adenoma (bx not removed)  . Colonoscopy 01/2005    poor prep, polyp could not be found  . Colonoscopy 05/2005    with EMR, polypectomy Dr. Olegario Messier, bx showed high grade dysplasia, partially resected  . Colonoscopy 09/2005    Dr. Arsenio Loader, [place] ink tattooing, four villous colon polyp (3 had been missed on previous colonoscopies due to limitations of procedures  . Colon surgery 09/2005    four tubular adenomas, no cancer  . Appendectomy 09/2005    at time of left hemicolectomy  . Colonoscopy 09/2006    normal TI, no polyps  . Colonoscopy 10/2007    Dr. Imogene Burn distal mammillations, benign bx, normal TI, random bx neg for microscopic colitis   HPI:  Jordan Ward is a a 71 yo man who was living at home prior to hospital admission  for weakness and infection in early January. He has a history of MS. He has been receiving dysphagia therapy at [facility] since his admission and is here for a repeat MBSS for possible diet upgrade. He was recently upgraded clinically to mechanical soft and nectar-thick liquids.  Symptoms/Limitations Symptoms: Mr. Kolenovic is a 71 yo male resident at [facility] who is known to this SLP from previous inpatient admission and MBSS completed on 10/07/2011. Previous MBSS showedModerate oral phase dysphagia characterized by decreased oral control, weak lingual manipulation, decreased mastication of semi-solids and moderate/severe pharyngeal phase dysphagia characterized by premature spillage to pyriforms (better with puree and teaspoon presentation honey-thick liquids), delay in swallow initiation with spillover of liquids into laryngeal vestibule during the swallow, and decreased hyolaryngeal excursion. Pt was variably sensate to aspirate, but his cough was very weak and often ineffective. A chin tuck posture helped 50% of the time. Best performance is with puree and honey-thick liquids by teaspoon presentation.:  Special Tests: MBSS  Recommendation/Prognosis  Clinical Impression Dysphagia Diagnosis: Mild oral phase dysphagia;Mild pharyngeal phase dysphagia Clinical impression: Overall min oropharyngeal phase dysphagia in setting of sparse dentition (molars) with likley min lingual weakness and premature spillage into laryngeal vestibule with large sips thin resulting in min oral/lingual residuals of liquids and aspiration x 2 of thin liquids with large sip. Aspiration was eliminated when pt took small, controlled sips with secondary  swallow to clear vallecular residuals that pool after primary swallow. Swallow Evaluation Recommendations Solid Consistency: Dysphagia 3 (Mechanical soft) (Treating SLP to upgrade clinically to regular if appropriate) Liquid Consistency: Thin Liquid Administration via: Cup;Straw (small sips, head  neutral, double swallow) Medication Administration: Whole meds with liquid Supervision: Patient able to self feed;Intermittent supervision to cue for compensatory strategies Compensations: Slow rate;Small sips/bites;Multiple dry swallows after each bite/sip Postural Changes and/or Swallow Maneuvers: Out of bed for meals;Seated upright 90 degrees;Upright 30-60 min after meal Oral Care Recommendations: Oral care BID;Patient independent with oral care Other Recommendations: Clarify dietary restrictions Follow up Recommendations: Skilled Nursing facility Prognosis Prognosis for Safe Diet Advancement: Good Individuals Consulted Consulted and Agree with Results and Recommendations: Patient Report Sent to : Primary SLP;Facility (Comment)  SLP Assessment/Plan Potential to Achieve Goals: Good   General:  Date of Onset: 09/30/11 HPI: Jordan Ward is a a 71 yo man who was living at home prior to hospital admission for weakness and infection in early January. He has a history of MS. He has been receiving dysphagia therapy at [facility] since his admission and is here for a repeat MBSS for possible diet upgrade. He was recently upgraded clinically to mechanical soft and nectar-thick liquids. Type of Study: Repeat MBS Diet Prior to this Study: Nectar-thick liquids;Dysphagia 3 (soft) Temperature Spikes Noted: No Respiratory Status: Room air History of Intubation: No Behavior/Cognition: Alert;Cooperative Oral Cavity - Dentition:  (Missing some dentition (back molars)) Oral Motor / Sensory Function: Within functional limits Vision:  (Pt wears glasses and was able to self present in MBSS) Patient Positioning: Upright in chair Baseline Vocal Quality: Clear Volitional Cough: Strong Volitional Swallow: Able to elicit Anatomy: Within functional limits Pharyngeal Secretions: Not observed secondary MBS  Reason for Referral:  Objectively evaluate swallow function due to concerns of possible aspiration and identify  appropriate diet and compensatory strategies as needed.  Oral Phase Oral Preparation/Oral Phase Oral Phase: Impaired Oral - Thin Oral - Thin Cup: Lingual/palatal residue Oral Phase - Comment Oral Phase - Comment: Seemingly adequte oral control when cued to take small sips, min prolonged oral transit with solids likely due to sparse molars. Pt does have min lingual residuals with thin liquids that spill to valleculae immediately after primary swallow.  Pharyngeal Phase  Pharyngeal Phase Pharyngeal Phase: Impaired Pharyngeal - Thin Pharyngeal - Thin Cup: Premature spillage to pyriform;Premature spillage to valleculae;Penetration/Aspiration before swallow;Reduced airway/laryngeal closure;Trace aspiration;Lateral channel residue;Pharyngeal residue - valleculae Penetration/Aspiration details (thin cup): Material does not enter airway;Material enters airway, passes BELOW cords and not ejected out despite cough attempt by patient Pharyngeal - Thin Straw: Premature spillage to pyriform;Penetration/Aspiration before swallow;Reduced airway/laryngeal closure;Pharyngeal residue - valleculae;Lateral channel residue;Compensatory strategies attempted (Comment);Trace aspiration (small controlled sips with repeat swallow) Penetration/Aspiration details (thin straw): Material does not enter airway;Material enters airway, passes BELOW cords and not ejected out despite cough attempt by patient Pharyngeal Phase - Comment Pharyngeal Comment: Two episodes trace aspiration occurred before the swallow when taking large cup sips and large straw sips. Aspiration was eliminated when taking small, controlled sips with cued secondary swallow.  Cervical Esophageal Phase  Cervical Esophageal Phase Cervical Esophageal Phase: [place], [place]   [place] 11/06/2011, 11:46 AM

## 2011-11-10 ENCOUNTER — Other Ambulatory Visit (HOSPITAL_BASED_OUTPATIENT_CLINIC_OR_DEPARTMENT_OTHER): Payer: Self-pay | Admitting: Internal Medicine

## 2011-11-10 ENCOUNTER — Ambulatory Visit (HOSPITAL_COMMUNITY)
Admission: RE | Admit: 2011-11-10 | Discharge: 2011-11-10 | Disposition: A | Discharge status: Home / Self Care | Payer: Medicare Other | Source: Ambulatory Visit | Attending: Internal Medicine | Admitting: Internal Medicine

## 2011-11-10 DIAGNOSIS — R0602 Shortness of breath: Secondary | ICD-10-CM

## 2011-11-10 DIAGNOSIS — R05 Cough: Secondary | ICD-10-CM | POA: Insufficient documentation

## 2011-11-10 DIAGNOSIS — J8410 Pulmonary fibrosis, unspecified: Secondary | ICD-10-CM | POA: Insufficient documentation

## 2011-11-10 DIAGNOSIS — R059 Cough, unspecified: Secondary | ICD-10-CM | POA: Insufficient documentation

## 2011-11-11 DIAGNOSIS — N478 Other disorders of prepuce: Secondary | ICD-10-CM | POA: Diagnosis not present

## 2011-11-17 ENCOUNTER — Telehealth: Payer: Self-pay | Admitting: Gastroenterology

## 2011-11-17 NOTE — Telephone Encounter (Signed)
[facility] is aware of OV on 2/25 at 0900 with [facility]

## 2011-11-17 NOTE — Telephone Encounter (Signed)
Following up on some of patient's information from prior visit last year.  Patient recently d/c from hospital. He had CDiff. He also had drop in h/h requiring blood transfusion. Dr. Caryn Section wants outpatient w/u. He also had elevated lfts, abnormal CBD on CT in 06/2011.   Please arrange appt.

## 2011-11-19 DIAGNOSIS — N319 Neuromuscular dysfunction of bladder, unspecified: Secondary | ICD-10-CM | POA: Diagnosis not present

## 2011-11-19 DIAGNOSIS — J8410 Pulmonary fibrosis, unspecified: Secondary | ICD-10-CM | POA: Diagnosis not present

## 2011-11-20 ENCOUNTER — Ambulatory Visit (HOSPITAL_COMMUNITY)
Admission: RE | Admit: 2011-11-20 | Discharge: 2011-11-20 | Disposition: A | Discharge status: Home / Self Care | Payer: Medicare Other | Source: Ambulatory Visit | Attending: Pulmonary Disease | Admitting: Pulmonary Disease

## 2011-11-20 DIAGNOSIS — R0602 Shortness of breath: Secondary | ICD-10-CM | POA: Insufficient documentation

## 2011-11-20 DIAGNOSIS — G35 Multiple sclerosis: Secondary | ICD-10-CM | POA: Diagnosis not present

## 2011-11-20 DIAGNOSIS — R911 Solitary pulmonary nodule: Secondary | ICD-10-CM | POA: Diagnosis not present

## 2011-11-24 ENCOUNTER — Ambulatory Visit: Payer: Medicare Other | Admitting: Gastroenterology

## 2011-11-24 DIAGNOSIS — E039 Hypothyroidism, unspecified: Secondary | ICD-10-CM | POA: Diagnosis not present

## 2011-11-24 NOTE — Procedures (Signed)
NAMEPEDRO, HOCKER                  ACCOUNT NO.:  [id number]  MEDICAL RECORD NO.:  [id number]  LOCATION:                                 FACILITY:  PHYSICIAN:  Dakari Stabler L. Luan Pulling, M.D.DATE OF BIRTH:  [birth year]  DATE OF PROCEDURE: DATE OF DISCHARGE:                           PULMONARY FUNCTION TEST   Reason for pulmonary function testing is shortness of breath. 1. Spirometry shows no definite ventilatory defect or airflow     obstruction. 2. Lung volumes show borderline restrictive change in total lung     capacity.  3.  DLCO is severely reduced, but shows some correction     when he adjusted for volume. 3. Airway resistance is normal.     Ohana Birdwell L. Luan Pulling, M.D.     ELH/MEDQ  D:  11/23/2011  T:  11/23/2011  Job:  [id number]

## 2011-12-01 DIAGNOSIS — G35 Multiple sclerosis: Secondary | ICD-10-CM | POA: Diagnosis not present

## 2011-12-01 DIAGNOSIS — L89309 Pressure ulcer of unspecified buttock, unspecified stage: Secondary | ICD-10-CM | POA: Diagnosis not present

## 2011-12-01 DIAGNOSIS — Z466 Encounter for fitting and adjustment of urinary device: Secondary | ICD-10-CM | POA: Diagnosis not present

## 2011-12-01 DIAGNOSIS — L8995 Pressure ulcer of unspecified site, unstageable: Secondary | ICD-10-CM | POA: Diagnosis not present

## 2011-12-01 DIAGNOSIS — D649 Anemia, unspecified: Secondary | ICD-10-CM | POA: Diagnosis not present

## 2011-12-01 DIAGNOSIS — R1310 Dysphagia, unspecified: Secondary | ICD-10-CM | POA: Diagnosis not present

## 2011-12-03 DIAGNOSIS — D649 Anemia, unspecified: Secondary | ICD-10-CM | POA: Diagnosis not present

## 2011-12-03 DIAGNOSIS — R1310 Dysphagia, unspecified: Secondary | ICD-10-CM | POA: Diagnosis not present

## 2011-12-03 DIAGNOSIS — L8995 Pressure ulcer of unspecified site, unstageable: Secondary | ICD-10-CM | POA: Diagnosis not present

## 2011-12-03 DIAGNOSIS — L89309 Pressure ulcer of unspecified buttock, unspecified stage: Secondary | ICD-10-CM | POA: Diagnosis not present

## 2011-12-03 DIAGNOSIS — G35 Multiple sclerosis: Secondary | ICD-10-CM | POA: Diagnosis not present

## 2011-12-03 DIAGNOSIS — Z466 Encounter for fitting and adjustment of urinary device: Secondary | ICD-10-CM | POA: Diagnosis not present

## 2011-12-04 DIAGNOSIS — Z466 Encounter for fitting and adjustment of urinary device: Secondary | ICD-10-CM | POA: Diagnosis not present

## 2011-12-04 DIAGNOSIS — G35 Multiple sclerosis: Secondary | ICD-10-CM | POA: Diagnosis not present

## 2011-12-04 DIAGNOSIS — R1310 Dysphagia, unspecified: Secondary | ICD-10-CM | POA: Diagnosis not present

## 2011-12-04 DIAGNOSIS — L8995 Pressure ulcer of unspecified site, unstageable: Secondary | ICD-10-CM | POA: Diagnosis not present

## 2011-12-04 DIAGNOSIS — D649 Anemia, unspecified: Secondary | ICD-10-CM | POA: Diagnosis not present

## 2011-12-04 DIAGNOSIS — IMO0002 Reserved for concepts with insufficient information to code with codable children: Secondary | ICD-10-CM | POA: Diagnosis not present

## 2011-12-04 DIAGNOSIS — L89309 Pressure ulcer of unspecified buttock, unspecified stage: Secondary | ICD-10-CM | POA: Diagnosis not present

## 2011-12-04 DIAGNOSIS — E039 Hypothyroidism, unspecified: Secondary | ICD-10-CM | POA: Diagnosis not present

## 2011-12-04 DIAGNOSIS — J449 Chronic obstructive pulmonary disease, unspecified: Secondary | ICD-10-CM | POA: Diagnosis not present

## 2011-12-05 DIAGNOSIS — G35 Multiple sclerosis: Secondary | ICD-10-CM | POA: Diagnosis not present

## 2011-12-05 DIAGNOSIS — L89309 Pressure ulcer of unspecified buttock, unspecified stage: Secondary | ICD-10-CM | POA: Diagnosis not present

## 2011-12-05 DIAGNOSIS — L8995 Pressure ulcer of unspecified site, unstageable: Secondary | ICD-10-CM | POA: Diagnosis not present

## 2011-12-05 DIAGNOSIS — R1310 Dysphagia, unspecified: Secondary | ICD-10-CM | POA: Diagnosis not present

## 2011-12-05 DIAGNOSIS — Z466 Encounter for fitting and adjustment of urinary device: Secondary | ICD-10-CM | POA: Diagnosis not present

## 2011-12-05 DIAGNOSIS — D649 Anemia, unspecified: Secondary | ICD-10-CM | POA: Diagnosis not present

## 2011-12-08 DIAGNOSIS — D649 Anemia, unspecified: Secondary | ICD-10-CM | POA: Diagnosis not present

## 2011-12-08 DIAGNOSIS — L89309 Pressure ulcer of unspecified buttock, unspecified stage: Secondary | ICD-10-CM | POA: Diagnosis not present

## 2011-12-08 DIAGNOSIS — G35 Multiple sclerosis: Secondary | ICD-10-CM | POA: Diagnosis not present

## 2011-12-08 DIAGNOSIS — Z466 Encounter for fitting and adjustment of urinary device: Secondary | ICD-10-CM | POA: Diagnosis not present

## 2011-12-08 DIAGNOSIS — R1310 Dysphagia, unspecified: Secondary | ICD-10-CM | POA: Diagnosis not present

## 2011-12-08 DIAGNOSIS — L8995 Pressure ulcer of unspecified site, unstageable: Secondary | ICD-10-CM | POA: Diagnosis not present

## 2011-12-09 ENCOUNTER — Ambulatory Visit (INDEPENDENT_AMBULATORY_CARE_PROVIDER_SITE_OTHER): Payer: Medicare Other | Admitting: Urology

## 2011-12-09 DIAGNOSIS — N471 Phimosis: Secondary | ICD-10-CM

## 2011-12-09 DIAGNOSIS — N319 Neuromuscular dysfunction of bladder, unspecified: Secondary | ICD-10-CM

## 2011-12-09 DIAGNOSIS — N3941 Urge incontinence: Secondary | ICD-10-CM

## 2011-12-09 DIAGNOSIS — N478 Other disorders of prepuce: Secondary | ICD-10-CM | POA: Diagnosis not present

## 2011-12-09 DIAGNOSIS — N200 Calculus of kidney: Secondary | ICD-10-CM

## 2011-12-10 DIAGNOSIS — G35 Multiple sclerosis: Secondary | ICD-10-CM | POA: Diagnosis not present

## 2011-12-10 DIAGNOSIS — D649 Anemia, unspecified: Secondary | ICD-10-CM | POA: Diagnosis not present

## 2011-12-10 DIAGNOSIS — L8995 Pressure ulcer of unspecified site, unstageable: Secondary | ICD-10-CM | POA: Diagnosis not present

## 2011-12-10 DIAGNOSIS — L89309 Pressure ulcer of unspecified buttock, unspecified stage: Secondary | ICD-10-CM | POA: Diagnosis not present

## 2011-12-10 DIAGNOSIS — R1310 Dysphagia, unspecified: Secondary | ICD-10-CM | POA: Diagnosis not present

## 2011-12-10 DIAGNOSIS — Z466 Encounter for fitting and adjustment of urinary device: Secondary | ICD-10-CM | POA: Diagnosis not present

## 2011-12-11 DIAGNOSIS — L89309 Pressure ulcer of unspecified buttock, unspecified stage: Secondary | ICD-10-CM | POA: Diagnosis not present

## 2011-12-11 DIAGNOSIS — L8995 Pressure ulcer of unspecified site, unstageable: Secondary | ICD-10-CM | POA: Diagnosis not present

## 2011-12-11 DIAGNOSIS — G35 Multiple sclerosis: Secondary | ICD-10-CM | POA: Diagnosis not present

## 2011-12-11 DIAGNOSIS — D649 Anemia, unspecified: Secondary | ICD-10-CM | POA: Diagnosis not present

## 2011-12-11 DIAGNOSIS — Z466 Encounter for fitting and adjustment of urinary device: Secondary | ICD-10-CM | POA: Diagnosis not present

## 2011-12-11 DIAGNOSIS — R1310 Dysphagia, unspecified: Secondary | ICD-10-CM | POA: Diagnosis not present

## 2011-12-12 DIAGNOSIS — R1310 Dysphagia, unspecified: Secondary | ICD-10-CM | POA: Diagnosis not present

## 2011-12-12 DIAGNOSIS — G35 Multiple sclerosis: Secondary | ICD-10-CM | POA: Diagnosis not present

## 2011-12-12 DIAGNOSIS — Z466 Encounter for fitting and adjustment of urinary device: Secondary | ICD-10-CM | POA: Diagnosis not present

## 2011-12-12 DIAGNOSIS — L8995 Pressure ulcer of unspecified site, unstageable: Secondary | ICD-10-CM | POA: Diagnosis not present

## 2011-12-12 DIAGNOSIS — L89309 Pressure ulcer of unspecified buttock, unspecified stage: Secondary | ICD-10-CM | POA: Diagnosis not present

## 2011-12-12 DIAGNOSIS — D649 Anemia, unspecified: Secondary | ICD-10-CM | POA: Diagnosis not present

## 2011-12-15 DIAGNOSIS — G35 Multiple sclerosis: Secondary | ICD-10-CM | POA: Diagnosis not present

## 2011-12-15 DIAGNOSIS — L89309 Pressure ulcer of unspecified buttock, unspecified stage: Secondary | ICD-10-CM | POA: Diagnosis not present

## 2011-12-15 DIAGNOSIS — D649 Anemia, unspecified: Secondary | ICD-10-CM | POA: Diagnosis not present

## 2011-12-15 DIAGNOSIS — L8995 Pressure ulcer of unspecified site, unstageable: Secondary | ICD-10-CM | POA: Diagnosis not present

## 2011-12-15 DIAGNOSIS — Z466 Encounter for fitting and adjustment of urinary device: Secondary | ICD-10-CM | POA: Diagnosis not present

## 2011-12-15 DIAGNOSIS — R1310 Dysphagia, unspecified: Secondary | ICD-10-CM | POA: Diagnosis not present

## 2011-12-17 DIAGNOSIS — D649 Anemia, unspecified: Secondary | ICD-10-CM | POA: Diagnosis not present

## 2011-12-17 DIAGNOSIS — L8995 Pressure ulcer of unspecified site, unstageable: Secondary | ICD-10-CM | POA: Diagnosis not present

## 2011-12-17 DIAGNOSIS — Z466 Encounter for fitting and adjustment of urinary device: Secondary | ICD-10-CM | POA: Diagnosis not present

## 2011-12-17 DIAGNOSIS — G35 Multiple sclerosis: Secondary | ICD-10-CM | POA: Diagnosis not present

## 2011-12-17 DIAGNOSIS — R1310 Dysphagia, unspecified: Secondary | ICD-10-CM | POA: Diagnosis not present

## 2011-12-17 DIAGNOSIS — L89309 Pressure ulcer of unspecified buttock, unspecified stage: Secondary | ICD-10-CM | POA: Diagnosis not present

## 2011-12-18 DIAGNOSIS — L89309 Pressure ulcer of unspecified buttock, unspecified stage: Secondary | ICD-10-CM | POA: Diagnosis not present

## 2011-12-18 DIAGNOSIS — Z466 Encounter for fitting and adjustment of urinary device: Secondary | ICD-10-CM | POA: Diagnosis not present

## 2011-12-18 DIAGNOSIS — R1310 Dysphagia, unspecified: Secondary | ICD-10-CM | POA: Diagnosis not present

## 2011-12-18 DIAGNOSIS — L8995 Pressure ulcer of unspecified site, unstageable: Secondary | ICD-10-CM | POA: Diagnosis not present

## 2011-12-18 DIAGNOSIS — G35 Multiple sclerosis: Secondary | ICD-10-CM | POA: Diagnosis not present

## 2011-12-18 DIAGNOSIS — D649 Anemia, unspecified: Secondary | ICD-10-CM | POA: Diagnosis not present

## 2011-12-19 DIAGNOSIS — L89309 Pressure ulcer of unspecified buttock, unspecified stage: Secondary | ICD-10-CM | POA: Diagnosis not present

## 2011-12-19 DIAGNOSIS — Z466 Encounter for fitting and adjustment of urinary device: Secondary | ICD-10-CM | POA: Diagnosis not present

## 2011-12-19 DIAGNOSIS — L8995 Pressure ulcer of unspecified site, unstageable: Secondary | ICD-10-CM | POA: Diagnosis not present

## 2011-12-19 DIAGNOSIS — D649 Anemia, unspecified: Secondary | ICD-10-CM | POA: Diagnosis not present

## 2011-12-19 DIAGNOSIS — G35 Multiple sclerosis: Secondary | ICD-10-CM | POA: Diagnosis not present

## 2011-12-19 DIAGNOSIS — R1310 Dysphagia, unspecified: Secondary | ICD-10-CM | POA: Diagnosis not present

## 2011-12-22 DIAGNOSIS — L8995 Pressure ulcer of unspecified site, unstageable: Secondary | ICD-10-CM | POA: Diagnosis not present

## 2011-12-22 DIAGNOSIS — D649 Anemia, unspecified: Secondary | ICD-10-CM | POA: Diagnosis not present

## 2011-12-22 DIAGNOSIS — L89309 Pressure ulcer of unspecified buttock, unspecified stage: Secondary | ICD-10-CM | POA: Diagnosis not present

## 2011-12-22 DIAGNOSIS — Z466 Encounter for fitting and adjustment of urinary device: Secondary | ICD-10-CM | POA: Diagnosis not present

## 2011-12-22 DIAGNOSIS — G35 Multiple sclerosis: Secondary | ICD-10-CM | POA: Diagnosis not present

## 2011-12-22 DIAGNOSIS — R1310 Dysphagia, unspecified: Secondary | ICD-10-CM | POA: Diagnosis not present

## 2011-12-24 ENCOUNTER — Other Ambulatory Visit (HOSPITAL_COMMUNITY): Payer: Self-pay | Admitting: Pulmonary Disease

## 2011-12-24 DIAGNOSIS — L8995 Pressure ulcer of unspecified site, unstageable: Secondary | ICD-10-CM | POA: Diagnosis not present

## 2011-12-24 DIAGNOSIS — G35 Multiple sclerosis: Secondary | ICD-10-CM | POA: Diagnosis not present

## 2011-12-24 DIAGNOSIS — R1310 Dysphagia, unspecified: Secondary | ICD-10-CM | POA: Diagnosis not present

## 2011-12-24 DIAGNOSIS — Z466 Encounter for fitting and adjustment of urinary device: Secondary | ICD-10-CM | POA: Diagnosis not present

## 2011-12-24 DIAGNOSIS — L89309 Pressure ulcer of unspecified buttock, unspecified stage: Secondary | ICD-10-CM | POA: Diagnosis not present

## 2011-12-24 DIAGNOSIS — J8410 Pulmonary fibrosis, unspecified: Secondary | ICD-10-CM

## 2011-12-24 DIAGNOSIS — D649 Anemia, unspecified: Secondary | ICD-10-CM | POA: Diagnosis not present

## 2011-12-25 DIAGNOSIS — Z466 Encounter for fitting and adjustment of urinary device: Secondary | ICD-10-CM | POA: Diagnosis not present

## 2011-12-25 DIAGNOSIS — G35 Multiple sclerosis: Secondary | ICD-10-CM | POA: Diagnosis not present

## 2011-12-25 DIAGNOSIS — L8995 Pressure ulcer of unspecified site, unstageable: Secondary | ICD-10-CM | POA: Diagnosis not present

## 2011-12-25 DIAGNOSIS — R1310 Dysphagia, unspecified: Secondary | ICD-10-CM | POA: Diagnosis not present

## 2011-12-25 DIAGNOSIS — L89309 Pressure ulcer of unspecified buttock, unspecified stage: Secondary | ICD-10-CM | POA: Diagnosis not present

## 2011-12-25 DIAGNOSIS — D649 Anemia, unspecified: Secondary | ICD-10-CM | POA: Diagnosis not present

## 2011-12-29 DIAGNOSIS — Z466 Encounter for fitting and adjustment of urinary device: Secondary | ICD-10-CM | POA: Diagnosis not present

## 2011-12-29 DIAGNOSIS — G35 Multiple sclerosis: Secondary | ICD-10-CM | POA: Diagnosis not present

## 2011-12-29 DIAGNOSIS — D649 Anemia, unspecified: Secondary | ICD-10-CM | POA: Diagnosis not present

## 2011-12-29 DIAGNOSIS — G35D Multiple sclerosis, unspecified: Secondary | ICD-10-CM | POA: Diagnosis not present

## 2011-12-29 DIAGNOSIS — R1310 Dysphagia, unspecified: Secondary | ICD-10-CM | POA: Diagnosis not present

## 2011-12-29 DIAGNOSIS — R911 Solitary pulmonary nodule: Secondary | ICD-10-CM | POA: Diagnosis not present

## 2011-12-29 DIAGNOSIS — L8995 Pressure ulcer of unspecified site, unstageable: Secondary | ICD-10-CM | POA: Diagnosis not present

## 2011-12-29 DIAGNOSIS — Z79899 Other long term (current) drug therapy: Secondary | ICD-10-CM | POA: Diagnosis not present

## 2011-12-29 DIAGNOSIS — L89309 Pressure ulcer of unspecified buttock, unspecified stage: Secondary | ICD-10-CM | POA: Diagnosis not present

## 2011-12-31 DIAGNOSIS — D649 Anemia, unspecified: Secondary | ICD-10-CM | POA: Diagnosis not present

## 2011-12-31 DIAGNOSIS — Z466 Encounter for fitting and adjustment of urinary device: Secondary | ICD-10-CM | POA: Diagnosis not present

## 2011-12-31 DIAGNOSIS — R1310 Dysphagia, unspecified: Secondary | ICD-10-CM | POA: Diagnosis not present

## 2011-12-31 DIAGNOSIS — L8995 Pressure ulcer of unspecified site, unstageable: Secondary | ICD-10-CM | POA: Diagnosis not present

## 2011-12-31 DIAGNOSIS — G35 Multiple sclerosis: Secondary | ICD-10-CM | POA: Diagnosis not present

## 2011-12-31 DIAGNOSIS — L89309 Pressure ulcer of unspecified buttock, unspecified stage: Secondary | ICD-10-CM | POA: Diagnosis not present

## 2012-01-01 DIAGNOSIS — R1310 Dysphagia, unspecified: Secondary | ICD-10-CM | POA: Diagnosis not present

## 2012-01-01 DIAGNOSIS — D649 Anemia, unspecified: Secondary | ICD-10-CM | POA: Diagnosis not present

## 2012-01-01 DIAGNOSIS — G35 Multiple sclerosis: Secondary | ICD-10-CM | POA: Diagnosis not present

## 2012-01-01 DIAGNOSIS — Z466 Encounter for fitting and adjustment of urinary device: Secondary | ICD-10-CM | POA: Diagnosis not present

## 2012-01-01 DIAGNOSIS — L8995 Pressure ulcer of unspecified site, unstageable: Secondary | ICD-10-CM | POA: Diagnosis not present

## 2012-01-01 DIAGNOSIS — L89309 Pressure ulcer of unspecified buttock, unspecified stage: Secondary | ICD-10-CM | POA: Diagnosis not present

## 2012-01-02 DIAGNOSIS — Z466 Encounter for fitting and adjustment of urinary device: Secondary | ICD-10-CM | POA: Diagnosis not present

## 2012-01-02 DIAGNOSIS — G35 Multiple sclerosis: Secondary | ICD-10-CM | POA: Diagnosis not present

## 2012-01-02 DIAGNOSIS — L89309 Pressure ulcer of unspecified buttock, unspecified stage: Secondary | ICD-10-CM | POA: Diagnosis not present

## 2012-01-02 DIAGNOSIS — L8995 Pressure ulcer of unspecified site, unstageable: Secondary | ICD-10-CM | POA: Diagnosis not present

## 2012-01-02 DIAGNOSIS — D649 Anemia, unspecified: Secondary | ICD-10-CM | POA: Diagnosis not present

## 2012-01-02 DIAGNOSIS — R1310 Dysphagia, unspecified: Secondary | ICD-10-CM | POA: Diagnosis not present

## 2012-01-03 ENCOUNTER — Encounter (HOSPITAL_COMMUNITY): Payer: Self-pay | Admitting: Emergency Medicine

## 2012-01-03 ENCOUNTER — Emergency Department (HOSPITAL_COMMUNITY)
Admission: EM | Admit: 2012-01-03 | Discharge: 2012-01-04 | Disposition: A | Discharge status: Home / Self Care | Payer: Medicare Other | Attending: Emergency Medicine | Admitting: Emergency Medicine

## 2012-01-03 DIAGNOSIS — I10 Essential (primary) hypertension: Secondary | ICD-10-CM | POA: Insufficient documentation

## 2012-01-03 DIAGNOSIS — G4700 Insomnia, unspecified: Secondary | ICD-10-CM | POA: Diagnosis not present

## 2012-01-03 DIAGNOSIS — D649 Anemia, unspecified: Secondary | ICD-10-CM | POA: Insufficient documentation

## 2012-01-03 DIAGNOSIS — Z7982 Long term (current) use of aspirin: Secondary | ICD-10-CM | POA: Diagnosis not present

## 2012-01-03 DIAGNOSIS — E039 Hypothyroidism, unspecified: Secondary | ICD-10-CM | POA: Diagnosis not present

## 2012-01-03 DIAGNOSIS — Z87891 Personal history of nicotine dependence: Secondary | ICD-10-CM | POA: Diagnosis not present

## 2012-01-03 DIAGNOSIS — M129 Arthropathy, unspecified: Secondary | ICD-10-CM | POA: Diagnosis not present

## 2012-01-03 DIAGNOSIS — R339 Retention of urine, unspecified: Secondary | ICD-10-CM | POA: Insufficient documentation

## 2012-01-03 DIAGNOSIS — Z8673 Personal history of transient ischemic attack (TIA), and cerebral infarction without residual deficits: Secondary | ICD-10-CM | POA: Diagnosis not present

## 2012-01-03 DIAGNOSIS — T83091A Other mechanical complication of indwelling urethral catheter, initial encounter: Secondary | ICD-10-CM | POA: Diagnosis not present

## 2012-01-03 DIAGNOSIS — G35 Multiple sclerosis: Secondary | ICD-10-CM | POA: Diagnosis not present

## 2012-01-03 DIAGNOSIS — T839XXA Unspecified complication of genitourinary prosthetic device, implant and graft, initial encounter: Secondary | ICD-10-CM

## 2012-01-03 DIAGNOSIS — Z79899 Other long term (current) drug therapy: Secondary | ICD-10-CM | POA: Insufficient documentation

## 2012-01-03 NOTE — ED Notes (Signed)
Pt old foley catheter discontinued as ordered without difficulty. A small amount blood noted at the end of the meatus after foley catheter discontinued. Patricia Pesa, RN

## 2012-01-03 NOTE — ED Notes (Signed)
Pt with urinary catheter in place.  Leaking tonight from insertion site

## 2012-01-03 NOTE — ED Provider Notes (Signed)
History    Scribed for Jordan Speak, MD, the patient was seen in room APA18/APA18. This chart was scribed by Lyndee Hensen.   CSN: [id number]  Arrival date & time 01/03/12  2234   None     Chief Complaint  Patient presents with  . Urinary Retention    Has catheter in place but leaking    (Consider location/radiation/quality/duration/timing/severity/associated sxs/prior treatment) HPI  Pt was seen at 11:01 PM   Jordan Ward is a 71 y.o. male who presents to the Emergency Department complaining of moderate persistent urinary retention since today.  Patient with history of MS and had catheter since October 2012.  Patient gets catheter changed every 4-6 weeks.  Patient reports catheter leaking around insertion site.  Symptoms are not associated with fever, back pain or abominal pain.   Nothing makes problem worse.  No treatment prior to arrival.  Patient was just released for [facility].     PCP MANN,BENJAMIN L, PA, PA-C   Past Medical History  Diagnosis Date  . Arthritis   . MS (multiple sclerosis)   . Insomnia   . HTN (hypertension)   . PVD (peripheral vascular disease)   . Carotid artery stenosis   . TIA (transient ischemic attack)   . Kidney stones   . HTN (hypertension), malignant 10/06/2011  . Chronic indwelling foley catheter 10/06/2011  . Pulmonary fibrosis 10/06/2011  . Dysphagia 10/07/2011  . Tremors of nervous system 10/08/2011  . Hypothyroidism 10/08/2011  . Pulmonary nodule 10/08/2011  . C. difficile colitis   . Pneumonia   . Encephalopathy   . Urinary tract infection   . Tremors of nervous system   . Hypokalemia   . Junctional rhythm   . Anemia   . Hypernatremia     Past Surgical History  Procedure Date  . Cholecystectomy   . Inguinal hernia repair 1971    bilateral  . Back surgery 1976/1979  . Colonoscopy 11/2004    Dr. Sharol Roussel sessile polyp splenic flexure, 60mm sessile polyp desc colon, tubulovillous adenoma (bx not removed)  . Colonoscopy 01/2005    poor prep, polyp could not be found  . Colonoscopy 05/2005    with EMR, polypectomy Dr. Olegario Messier, bx showed high grade dysplasia, partially resected  . Colonoscopy 09/2005    Dr. Arsenio Loader, [place] ink tattooing, four villous colon polyp (3 had been missed on previous colonoscopies due to limitations of procedures  . Colon surgery 09/2005    four tubular adenomas, no cancer  . Appendectomy 09/2005    at time of left hemicolectomy  . Colonoscopy 09/2006    normal TI, no polyps  . Colonoscopy 10/2007    Dr. Imogene Burn distal mammillations, benign bx, normal TI, random bx neg for microscopic colitis    Family History  Problem Relation Age of Onset  . Colon cancer Neg Hx   . Cirrhosis Brother     etoh  . Stroke Mother 73  . Coronary artery disease Father 66  . Heart attack Brother     History  Substance Use Topics  . Smoking status: Former Smoker    Types: Cigarettes  . Smokeless tobacco: Never Used   Comment: quit remote  . Alcohol Use: No      Review of Systems  All other systems reviewed and are negative.    Allergies  Tetracyclines & related  Home Medications   Current Outpatient Rx  Name Route Sig Dispense Refill  . IPRATROPIUM-ALBUTEROL 18-103 MCG/ACT IN AERO Inhalation Inhale  2 puffs into the lungs 3 (three) times daily. 1 Inhaler 1  . ASPIRIN EC 81 MG PO TBEC Oral Take 81 mg by mouth daily.      Marland Kitchen CLONIDINE HCL 0.2 MG PO TABS  1 tablet 2 (two) times daily.     Marland Kitchen DIAZEPAM 5 MG PO TABS Oral Take 1 tablet (5 mg total) by mouth 2 (two) times daily. 30 tablet 0  . HYDROCHLOROTHIAZIDE 25 MG PO TABS  1 tablet daily.     Marland Kitchen HYDROCODONE-ACETAMINOPHEN 7.5-650 MG PO TABS Oral Take 1 tablet by mouth every 6 (six) hours as needed. pain 30 tablet 0  . LEVOTHYROXINE SODIUM 50 MCG PO TABS Oral Take 1 tablet (50 mcg total) by mouth daily before breakfast. 30 tablet 2  . POTASSIUM CHLORIDE CRYS ER 20 MEQ PO TBCR Oral Take 1 tablet (20 mEq total) by mouth daily.    . TEGRETOL  200 MG PO TABS  1 tablet 2 (two) times daily.     Marland Kitchen VERAPAMIL HCL ER 180 MG PO TBCR  1 tablet 2 (two) times daily.       BP 90/63  Pulse 71  Temp(Src) 98.2 F (36.8 C) (Oral)  Resp 20  Ht 5\' 7"  (1.702 m)  Wt 137 lb (62.143 kg)  BMI 21.46 kg/m2  SpO2 99%  Physical Exam  Nursing note and vitals reviewed. Constitutional: He is oriented to person, place, and time. He appears well-developed. No distress.  HENT:  Head: Normocephalic and atraumatic.  Eyes: Conjunctivae and EOM are normal.  Neck: Neck supple.  Cardiovascular: Normal rate and regular rhythm.   No murmur heard. Pulmonary/Chest: Effort normal and breath sounds normal. No respiratory distress.  Abdominal: Soft. There is no tenderness. There is no rebound and no guarding.  Genitourinary: Uncircumcised.  Musculoskeletal: Normal range of motion. He exhibits no edema.  Neurological: He is alert and oriented to person, place, and time. No sensory deficit. Coordination normal.  Skin: Skin is warm and dry. No rash noted.  Psychiatric: He has a normal mood and affect. His behavior is normal.    ED Course  Procedures (including critical care time)   DIAGNOSTIC STUDIES: Oxygen Saturation is 99% on room air, normal by my interpretation.     COORDINATION OF CARE: 11:07 PM  Physical exam complete.  Will replace foley catheter.      LABS / RADIOLOGY:   Labs Reviewed - No data to display No results found.       MDM  Cath changed and patient doing well.  Will discharge to home.        MEDICATIONS GIVEN IN THE E.D. Scheduled Meds:   Continuous Infusions:       IMPRESSION: No diagnosis found.   NEW MEDICATIONS: New Prescriptions   No medications on file      I personally performed the services described in this documentation, which was scribed in my presence. The recorded information has been reviewed and considered.          Jordan Speak, MD 01/03/12 [phone]

## 2012-01-05 ENCOUNTER — Ambulatory Visit (HOSPITAL_COMMUNITY)
Admission: RE | Admit: 2012-01-05 | Discharge: 2012-01-05 | Disposition: A | Discharge status: Home / Self Care | Payer: Medicare Other | Source: Ambulatory Visit | Attending: Pulmonary Disease | Admitting: Pulmonary Disease

## 2012-01-05 DIAGNOSIS — Z466 Encounter for fitting and adjustment of urinary device: Secondary | ICD-10-CM | POA: Diagnosis not present

## 2012-01-05 DIAGNOSIS — R918 Other nonspecific abnormal finding of lung field: Secondary | ICD-10-CM | POA: Diagnosis not present

## 2012-01-05 DIAGNOSIS — D649 Anemia, unspecified: Secondary | ICD-10-CM | POA: Diagnosis not present

## 2012-01-05 DIAGNOSIS — J8410 Pulmonary fibrosis, unspecified: Secondary | ICD-10-CM | POA: Insufficient documentation

## 2012-01-05 DIAGNOSIS — L8995 Pressure ulcer of unspecified site, unstageable: Secondary | ICD-10-CM | POA: Diagnosis not present

## 2012-01-05 DIAGNOSIS — G35 Multiple sclerosis: Secondary | ICD-10-CM | POA: Diagnosis not present

## 2012-01-05 DIAGNOSIS — L89309 Pressure ulcer of unspecified buttock, unspecified stage: Secondary | ICD-10-CM | POA: Diagnosis not present

## 2012-01-05 DIAGNOSIS — J984 Other disorders of lung: Secondary | ICD-10-CM | POA: Diagnosis not present

## 2012-01-05 DIAGNOSIS — I700 Atherosclerosis of aorta: Secondary | ICD-10-CM | POA: Diagnosis not present

## 2012-01-05 DIAGNOSIS — R1310 Dysphagia, unspecified: Secondary | ICD-10-CM | POA: Diagnosis not present

## 2012-01-05 MED ORDER — IOHEXOL 300 MG/ML  SOLN: 80.0000 mL | Freq: Once | INTRAMUSCULAR | Status: AC | PRN

## 2012-01-06 DIAGNOSIS — R1310 Dysphagia, unspecified: Secondary | ICD-10-CM | POA: Diagnosis not present

## 2012-01-06 DIAGNOSIS — G35 Multiple sclerosis: Secondary | ICD-10-CM | POA: Diagnosis not present

## 2012-01-06 DIAGNOSIS — L89309 Pressure ulcer of unspecified buttock, unspecified stage: Secondary | ICD-10-CM | POA: Diagnosis not present

## 2012-01-06 DIAGNOSIS — L8995 Pressure ulcer of unspecified site, unstageable: Secondary | ICD-10-CM | POA: Diagnosis not present

## 2012-01-06 DIAGNOSIS — D649 Anemia, unspecified: Secondary | ICD-10-CM | POA: Diagnosis not present

## 2012-01-06 DIAGNOSIS — Z466 Encounter for fitting and adjustment of urinary device: Secondary | ICD-10-CM | POA: Diagnosis not present

## 2012-01-07 DIAGNOSIS — Z466 Encounter for fitting and adjustment of urinary device: Secondary | ICD-10-CM | POA: Diagnosis not present

## 2012-01-07 DIAGNOSIS — D649 Anemia, unspecified: Secondary | ICD-10-CM | POA: Diagnosis not present

## 2012-01-07 DIAGNOSIS — I739 Peripheral vascular disease, unspecified: Secondary | ICD-10-CM | POA: Diagnosis not present

## 2012-01-07 DIAGNOSIS — R1310 Dysphagia, unspecified: Secondary | ICD-10-CM | POA: Diagnosis not present

## 2012-01-07 DIAGNOSIS — L8995 Pressure ulcer of unspecified site, unstageable: Secondary | ICD-10-CM | POA: Diagnosis not present

## 2012-01-07 DIAGNOSIS — L89309 Pressure ulcer of unspecified buttock, unspecified stage: Secondary | ICD-10-CM | POA: Diagnosis not present

## 2012-01-07 DIAGNOSIS — G35 Multiple sclerosis: Secondary | ICD-10-CM | POA: Diagnosis not present

## 2012-01-08 DIAGNOSIS — G35 Multiple sclerosis: Secondary | ICD-10-CM | POA: Diagnosis not present

## 2012-01-08 DIAGNOSIS — R1310 Dysphagia, unspecified: Secondary | ICD-10-CM | POA: Diagnosis not present

## 2012-01-08 DIAGNOSIS — G35D Multiple sclerosis, unspecified: Secondary | ICD-10-CM | POA: Diagnosis not present

## 2012-01-08 DIAGNOSIS — D649 Anemia, unspecified: Secondary | ICD-10-CM | POA: Diagnosis not present

## 2012-01-08 DIAGNOSIS — L8995 Pressure ulcer of unspecified site, unstageable: Secondary | ICD-10-CM | POA: Diagnosis not present

## 2012-01-08 DIAGNOSIS — R634 Abnormal weight loss: Secondary | ICD-10-CM | POA: Diagnosis not present

## 2012-01-08 DIAGNOSIS — L89309 Pressure ulcer of unspecified buttock, unspecified stage: Secondary | ICD-10-CM | POA: Diagnosis not present

## 2012-01-08 DIAGNOSIS — J8410 Pulmonary fibrosis, unspecified: Secondary | ICD-10-CM | POA: Diagnosis not present

## 2012-01-08 DIAGNOSIS — Z466 Encounter for fitting and adjustment of urinary device: Secondary | ICD-10-CM | POA: Diagnosis not present

## 2012-01-09 DIAGNOSIS — D649 Anemia, unspecified: Secondary | ICD-10-CM | POA: Diagnosis not present

## 2012-01-09 DIAGNOSIS — L89309 Pressure ulcer of unspecified buttock, unspecified stage: Secondary | ICD-10-CM | POA: Diagnosis not present

## 2012-01-09 DIAGNOSIS — G35 Multiple sclerosis: Secondary | ICD-10-CM | POA: Diagnosis not present

## 2012-01-09 DIAGNOSIS — R1310 Dysphagia, unspecified: Secondary | ICD-10-CM | POA: Diagnosis not present

## 2012-01-09 DIAGNOSIS — L8995 Pressure ulcer of unspecified site, unstageable: Secondary | ICD-10-CM | POA: Diagnosis not present

## 2012-01-09 DIAGNOSIS — Z466 Encounter for fitting and adjustment of urinary device: Secondary | ICD-10-CM | POA: Diagnosis not present

## 2012-01-12 DIAGNOSIS — L89309 Pressure ulcer of unspecified buttock, unspecified stage: Secondary | ICD-10-CM | POA: Diagnosis not present

## 2012-01-12 DIAGNOSIS — L8995 Pressure ulcer of unspecified site, unstageable: Secondary | ICD-10-CM | POA: Diagnosis not present

## 2012-01-12 DIAGNOSIS — Z466 Encounter for fitting and adjustment of urinary device: Secondary | ICD-10-CM | POA: Diagnosis not present

## 2012-01-12 DIAGNOSIS — D649 Anemia, unspecified: Secondary | ICD-10-CM | POA: Diagnosis not present

## 2012-01-12 DIAGNOSIS — R1310 Dysphagia, unspecified: Secondary | ICD-10-CM | POA: Diagnosis not present

## 2012-01-12 DIAGNOSIS — G35 Multiple sclerosis: Secondary | ICD-10-CM | POA: Diagnosis not present

## 2012-01-13 DIAGNOSIS — D649 Anemia, unspecified: Secondary | ICD-10-CM | POA: Diagnosis not present

## 2012-01-13 DIAGNOSIS — R1310 Dysphagia, unspecified: Secondary | ICD-10-CM | POA: Diagnosis not present

## 2012-01-13 DIAGNOSIS — G35 Multiple sclerosis: Secondary | ICD-10-CM | POA: Diagnosis not present

## 2012-01-13 DIAGNOSIS — L89309 Pressure ulcer of unspecified buttock, unspecified stage: Secondary | ICD-10-CM | POA: Diagnosis not present

## 2012-01-13 DIAGNOSIS — L8995 Pressure ulcer of unspecified site, unstageable: Secondary | ICD-10-CM | POA: Diagnosis not present

## 2012-01-13 DIAGNOSIS — Z466 Encounter for fitting and adjustment of urinary device: Secondary | ICD-10-CM | POA: Diagnosis not present

## 2012-01-15 DIAGNOSIS — G35 Multiple sclerosis: Secondary | ICD-10-CM | POA: Diagnosis not present

## 2012-01-15 DIAGNOSIS — D649 Anemia, unspecified: Secondary | ICD-10-CM | POA: Diagnosis not present

## 2012-01-15 DIAGNOSIS — L89309 Pressure ulcer of unspecified buttock, unspecified stage: Secondary | ICD-10-CM | POA: Diagnosis not present

## 2012-01-15 DIAGNOSIS — L8995 Pressure ulcer of unspecified site, unstageable: Secondary | ICD-10-CM | POA: Diagnosis not present

## 2012-01-15 DIAGNOSIS — Z466 Encounter for fitting and adjustment of urinary device: Secondary | ICD-10-CM | POA: Diagnosis not present

## 2012-01-15 DIAGNOSIS — R1310 Dysphagia, unspecified: Secondary | ICD-10-CM | POA: Diagnosis not present

## 2012-01-18 ENCOUNTER — Emergency Department (HOSPITAL_COMMUNITY)
Admission: EM | Admit: 2012-01-18 | Discharge: 2012-01-18 | Disposition: A | Discharge status: Home / Self Care | Payer: Medicare Other | Attending: Emergency Medicine | Admitting: Emergency Medicine

## 2012-01-18 ENCOUNTER — Encounter (HOSPITAL_COMMUNITY): Payer: Self-pay | Admitting: Emergency Medicine

## 2012-01-18 DIAGNOSIS — I10 Essential (primary) hypertension: Secondary | ICD-10-CM | POA: Insufficient documentation

## 2012-01-18 DIAGNOSIS — N390 Urinary tract infection, site not specified: Secondary | ICD-10-CM

## 2012-01-18 DIAGNOSIS — M129 Arthropathy, unspecified: Secondary | ICD-10-CM | POA: Diagnosis not present

## 2012-01-18 DIAGNOSIS — Z87442 Personal history of urinary calculi: Secondary | ICD-10-CM | POA: Insufficient documentation

## 2012-01-18 DIAGNOSIS — E039 Hypothyroidism, unspecified: Secondary | ICD-10-CM | POA: Insufficient documentation

## 2012-01-18 DIAGNOSIS — Z79899 Other long term (current) drug therapy: Secondary | ICD-10-CM | POA: Diagnosis not present

## 2012-01-18 DIAGNOSIS — I739 Peripheral vascular disease, unspecified: Secondary | ICD-10-CM | POA: Insufficient documentation

## 2012-01-18 DIAGNOSIS — G35 Multiple sclerosis: Secondary | ICD-10-CM | POA: Insufficient documentation

## 2012-01-18 DIAGNOSIS — R339 Retention of urine, unspecified: Secondary | ICD-10-CM | POA: Insufficient documentation

## 2012-01-18 DIAGNOSIS — Z8673 Personal history of transient ischemic attack (TIA), and cerebral infarction without residual deficits: Secondary | ICD-10-CM | POA: Diagnosis not present

## 2012-01-18 LAB — URINALYSIS, ROUTINE W REFLEX MICROSCOPIC
Nitrite: POSITIVE — AB
Specific Gravity, Urine: 1.01 (ref 1.005–1.030)
pH: 6.5 (ref 5.0–8.0)

## 2012-01-18 LAB — URINE MICROSCOPIC-ADD ON

## 2012-01-18 MED ORDER — CIPROFLOXACIN HCL 500 MG PO TABS: 500.0000 mg | ORAL_TABLET | Freq: Two times a day (BID) | ORAL | Status: AC

## 2012-01-18 MED ORDER — CIPROFLOXACIN HCL 250 MG PO TABS
500.0000 mg | ORAL_TABLET | Freq: Once | ORAL | Status: AC
  Administered 2012-01-18: 500 mg via ORAL
  Filled 2012-01-18: qty 2

## 2012-01-18 NOTE — ED Notes (Signed)
Pt states that his catheter is leaking from his penis, and no output from foley.

## 2012-01-18 NOTE — ED Provider Notes (Cosign Needed)
History  This chart was scribed for Jordan Diego, MD by Jenne Campus and Shona Needles. This patient was seen in room APA08/APA08 and the patient's care was started at 10:06PM.  CSN: [id number]  Arrival date & time 01/18/12  2134   First MD Initiated Contact with Patient 01/18/12 2203      Chief Complaint  Patient presents with  . Urinary Incontinence    catheter leaking from the penis    The history is provided by the patient. No language interpreter was used.    Jordan Ward is a 71 y.o. male who presents to the Emergency Department complaining of gradual onset, gradually worsening, constant urinary incontinence. Patient complains of catheter gradually leaking from his penis with no urine output from the foley onset 1:00PM. Pt states that he was seen on 01/13/12 for a clogged foley and has recently been having greater difficulty urinating. Pt lists no associated symptoms. He denies any modifying factors. He denies chills, pain, and the use of antibiotics currently for an infection. Pt has h/o MS, HTN, PVD, and Hypothyroidism. Pt has a h/o smoking and denies alcohol use.   Past Medical History  Diagnosis Date  . Arthritis   . MS (multiple sclerosis)   . Insomnia   . HTN (hypertension)   . PVD (peripheral vascular disease)   . Carotid artery stenosis   . TIA (transient ischemic attack)   . Kidney stones   . HTN (hypertension), malignant 10/06/2011  . Chronic indwelling foley catheter 10/06/2011  . Pulmonary fibrosis 10/06/2011  . Dysphagia 10/07/2011  . Tremors of nervous system 10/08/2011  . Hypothyroidism 10/08/2011  . Pulmonary nodule 10/08/2011  . C. difficile colitis   . Pneumonia   . Encephalopathy   . Urinary tract infection   . Tremors of nervous system   . Hypokalemia   . Junctional rhythm   . Anemia   . Hypernatremia     Past Surgical History  Procedure Date  . Cholecystectomy   . Inguinal hernia repair 1971    bilateral  . Back surgery 1976/1979  . Colonoscopy  11/2004    Dr. Sharol Roussel sessile polyp splenic flexure, 58mm sessile polyp desc colon, tubulovillous adenoma (bx not removed)  . Colonoscopy 01/2005    poor prep, polyp could not be found  . Colonoscopy 05/2005    with EMR, polypectomy Dr. Olegario Messier, bx showed high grade dysplasia, partially resected  . Colonoscopy 09/2005    Dr. Arsenio Loader, [place] ink tattooing, four villous colon polyp (3 had been missed on previous colonoscopies due to limitations of procedures  . Colon surgery 09/2005    four tubular adenomas, no cancer  . Appendectomy 09/2005    at time of left hemicolectomy  . Colonoscopy 09/2006    normal TI, no polyps  . Colonoscopy 10/2007    Dr. Imogene Burn distal mammillations, benign bx, normal TI, random bx neg for microscopic colitis    Family History  Problem Relation Age of Onset  . Colon cancer Neg Hx   . Cirrhosis Brother     etoh  . Stroke Mother 75  . Coronary artery disease Father 71  . Heart attack Brother     History  Substance Use Topics  . Smoking status: Former Smoker    Types: Cigarettes  . Smokeless tobacco: Never Used   Comment: quit remote  . Alcohol Use: No      Review of Systems  Constitutional: Negative for fatigue.  HENT: Negative for congestion, sinus pressure  and ear discharge.   Eyes: Negative for discharge.  Respiratory: Negative for cough.   Cardiovascular: Negative for chest pain.  Gastrointestinal: Negative for abdominal pain and diarrhea.  Genitourinary: Positive for difficulty urinating. Negative for frequency and hematuria.  Musculoskeletal: Negative for back pain.  Skin: Negative for rash.  Neurological: Negative for seizures and headaches.  Hematological: Negative.   Psychiatric/Behavioral: Negative for hallucinations.    Allergies  Tetracyclines & related  Home Medications   Current Outpatient Rx  Name Route Sig Dispense Refill  . IPRATROPIUM-ALBUTEROL 18-103 MCG/ACT IN AERO Inhalation Inhale 2 puffs into the  lungs 3 (three) times daily. 1 Inhaler 1  . ASPIRIN EC 81 MG PO TBEC Oral Take 81 mg by mouth daily.      Marland Kitchen CLONIDINE HCL 0.2 MG PO TABS  1 tablet 2 (two) times daily.     Marland Kitchen DIAZEPAM 5 MG PO TABS Oral Take 1 tablet (5 mg total) by mouth 2 (two) times daily. 30 tablet 0  . HYDROCHLOROTHIAZIDE 25 MG PO TABS  1 tablet daily.     Marland Kitchen HYDROCODONE-ACETAMINOPHEN 7.5-650 MG PO TABS Oral Take 1 tablet by mouth every 6 (six) hours as needed. pain 30 tablet 0  . LEVOTHYROXINE SODIUM 50 MCG PO TABS Oral Take 1 tablet (50 mcg total) by mouth daily before breakfast. 30 tablet 2  . POTASSIUM CHLORIDE CRYS ER 20 MEQ PO TBCR Oral Take 1 tablet (20 mEq total) by mouth daily.    . TEGRETOL 200 MG PO TABS  1 tablet 2 (two) times daily.     Marland Kitchen VERAPAMIL HCL ER 180 MG PO TBCR  1 tablet 2 (two) times daily.       Triage Vitals: BP 145/117  Pulse 101  Temp(Src) 98.7 F (37.1 C) (Oral)  Resp 20  SpO2 95%  Physical Exam  Nursing note and vitals reviewed. Constitutional: He is oriented to person, place, and time. He appears well-developed and well-nourished.       Pt is having significant chills  HENT:  Head: Normocephalic and atraumatic.  Eyes: Conjunctivae and EOM are normal. No scleral icterus.  Neck: Normal range of motion. Neck supple. No thyromegaly present.  Cardiovascular: Normal rate and regular rhythm.  Exam reveals no gallop and no friction rub.   No murmur heard. Pulmonary/Chest: Effort normal and breath sounds normal. No stridor. He has no wheezes. He has no rales. He exhibits no tenderness.  Abdominal: Soft. He exhibits no distension. There is no tenderness. There is no rebound.  Musculoskeletal: Normal range of motion. He exhibits no edema.  Lymphadenopathy:    He has no cervical adenopathy.  Neurological: He is alert and oriented to person, place, and time. Coordination normal.  Skin: Skin is warm and dry. No rash noted. No erythema.  Psychiatric: He has a normal mood and affect. His behavior  is normal.    ED Course  Procedures (including critical care time)  DIAGNOSTIC STUDIES: Oxygen Saturation is 95% on room air, adequate by my interpretation.    COORDINATION OF CARE: 10:07PM-Pt reports that new foley placed in the ED has relieved his urinary incontinence. Discussed urinalysis to check for infection and pt agreed.    Labs Reviewed  URINALYSIS, ROUTINE W REFLEX MICROSCOPIC - Abnormal; Notable for the following:    APPearance HAZY (*)    Hgb urine dipstick TRACE (*)    Nitrite POSITIVE (*)    Leukocytes, UA MODERATE (*)    All other components within normal limits  URINE  MICROSCOPIC-ADD ON - Abnormal; Notable for the following:    Bacteria, UA MANY (*)    All other components within normal limits    No results found.   No diagnosis found.  Pt improved with replacement of foley  MDM   The chart was scribed for me under my direct supervision.  I personally performed the history, physical, and medical decision making and all procedures in the evaluation of this patient.Jordan Diego, MD 01/18/12 [id number]  Jordan Diego, MD 01/20/12 [phone]

## 2012-01-18 NOTE — Discharge Instructions (Signed)
Follow up with your md next week. °

## 2012-01-19 DIAGNOSIS — R1310 Dysphagia, unspecified: Secondary | ICD-10-CM | POA: Diagnosis not present

## 2012-01-19 DIAGNOSIS — Z466 Encounter for fitting and adjustment of urinary device: Secondary | ICD-10-CM | POA: Diagnosis not present

## 2012-01-19 DIAGNOSIS — L8995 Pressure ulcer of unspecified site, unstageable: Secondary | ICD-10-CM | POA: Diagnosis not present

## 2012-01-19 DIAGNOSIS — G35 Multiple sclerosis: Secondary | ICD-10-CM | POA: Diagnosis not present

## 2012-01-19 DIAGNOSIS — L89309 Pressure ulcer of unspecified buttock, unspecified stage: Secondary | ICD-10-CM | POA: Diagnosis not present

## 2012-01-19 DIAGNOSIS — D649 Anemia, unspecified: Secondary | ICD-10-CM | POA: Diagnosis not present

## 2012-01-20 DIAGNOSIS — G35 Multiple sclerosis: Secondary | ICD-10-CM | POA: Diagnosis not present

## 2012-01-20 DIAGNOSIS — Z466 Encounter for fitting and adjustment of urinary device: Secondary | ICD-10-CM | POA: Diagnosis not present

## 2012-01-20 DIAGNOSIS — R1310 Dysphagia, unspecified: Secondary | ICD-10-CM | POA: Diagnosis not present

## 2012-01-20 DIAGNOSIS — L8995 Pressure ulcer of unspecified site, unstageable: Secondary | ICD-10-CM | POA: Diagnosis not present

## 2012-01-20 DIAGNOSIS — L89309 Pressure ulcer of unspecified buttock, unspecified stage: Secondary | ICD-10-CM | POA: Diagnosis not present

## 2012-01-20 DIAGNOSIS — D649 Anemia, unspecified: Secondary | ICD-10-CM | POA: Diagnosis not present

## 2012-01-23 DIAGNOSIS — L8995 Pressure ulcer of unspecified site, unstageable: Secondary | ICD-10-CM | POA: Diagnosis not present

## 2012-01-23 DIAGNOSIS — L89309 Pressure ulcer of unspecified buttock, unspecified stage: Secondary | ICD-10-CM | POA: Diagnosis not present

## 2012-01-23 DIAGNOSIS — R1310 Dysphagia, unspecified: Secondary | ICD-10-CM | POA: Diagnosis not present

## 2012-01-23 DIAGNOSIS — D649 Anemia, unspecified: Secondary | ICD-10-CM | POA: Diagnosis not present

## 2012-01-23 DIAGNOSIS — Z466 Encounter for fitting and adjustment of urinary device: Secondary | ICD-10-CM | POA: Diagnosis not present

## 2012-01-23 DIAGNOSIS — G35 Multiple sclerosis: Secondary | ICD-10-CM | POA: Diagnosis not present

## 2012-01-26 DIAGNOSIS — D649 Anemia, unspecified: Secondary | ICD-10-CM | POA: Diagnosis not present

## 2012-01-26 DIAGNOSIS — Z466 Encounter for fitting and adjustment of urinary device: Secondary | ICD-10-CM | POA: Diagnosis not present

## 2012-01-26 DIAGNOSIS — L89309 Pressure ulcer of unspecified buttock, unspecified stage: Secondary | ICD-10-CM | POA: Diagnosis not present

## 2012-01-26 DIAGNOSIS — G35 Multiple sclerosis: Secondary | ICD-10-CM | POA: Diagnosis not present

## 2012-01-26 DIAGNOSIS — R1310 Dysphagia, unspecified: Secondary | ICD-10-CM | POA: Diagnosis not present

## 2012-01-26 DIAGNOSIS — L8995 Pressure ulcer of unspecified site, unstageable: Secondary | ICD-10-CM | POA: Diagnosis not present

## 2012-01-28 DIAGNOSIS — R269 Unspecified abnormalities of gait and mobility: Secondary | ICD-10-CM | POA: Diagnosis not present

## 2012-01-28 DIAGNOSIS — I679 Cerebrovascular disease, unspecified: Secondary | ICD-10-CM | POA: Diagnosis not present

## 2012-01-28 DIAGNOSIS — R51 Headache: Secondary | ICD-10-CM | POA: Diagnosis not present

## 2012-01-29 DIAGNOSIS — L89309 Pressure ulcer of unspecified buttock, unspecified stage: Secondary | ICD-10-CM | POA: Diagnosis not present

## 2012-01-29 DIAGNOSIS — R1310 Dysphagia, unspecified: Secondary | ICD-10-CM | POA: Diagnosis not present

## 2012-01-29 DIAGNOSIS — Z466 Encounter for fitting and adjustment of urinary device: Secondary | ICD-10-CM | POA: Diagnosis not present

## 2012-01-29 DIAGNOSIS — D649 Anemia, unspecified: Secondary | ICD-10-CM | POA: Diagnosis not present

## 2012-01-29 DIAGNOSIS — G35 Multiple sclerosis: Secondary | ICD-10-CM | POA: Diagnosis not present

## 2012-01-29 DIAGNOSIS — L8995 Pressure ulcer of unspecified site, unstageable: Secondary | ICD-10-CM | POA: Diagnosis not present

## 2012-01-30 DIAGNOSIS — D649 Anemia, unspecified: Secondary | ICD-10-CM | POA: Diagnosis not present

## 2012-01-30 DIAGNOSIS — G35 Multiple sclerosis: Secondary | ICD-10-CM | POA: Diagnosis not present

## 2012-01-30 DIAGNOSIS — R1310 Dysphagia, unspecified: Secondary | ICD-10-CM | POA: Diagnosis not present

## 2012-01-30 DIAGNOSIS — L8995 Pressure ulcer of unspecified site, unstageable: Secondary | ICD-10-CM | POA: Diagnosis not present

## 2012-01-30 DIAGNOSIS — L89309 Pressure ulcer of unspecified buttock, unspecified stage: Secondary | ICD-10-CM | POA: Diagnosis not present

## 2012-01-30 DIAGNOSIS — Z466 Encounter for fitting and adjustment of urinary device: Secondary | ICD-10-CM | POA: Diagnosis not present

## 2012-02-02 DIAGNOSIS — G35 Multiple sclerosis: Secondary | ICD-10-CM | POA: Diagnosis not present

## 2012-02-02 DIAGNOSIS — D649 Anemia, unspecified: Secondary | ICD-10-CM | POA: Diagnosis not present

## 2012-02-02 DIAGNOSIS — Z466 Encounter for fitting and adjustment of urinary device: Secondary | ICD-10-CM | POA: Diagnosis not present

## 2012-02-02 DIAGNOSIS — L8995 Pressure ulcer of unspecified site, unstageable: Secondary | ICD-10-CM | POA: Diagnosis not present

## 2012-02-02 DIAGNOSIS — R1310 Dysphagia, unspecified: Secondary | ICD-10-CM | POA: Diagnosis not present

## 2012-02-02 DIAGNOSIS — L89309 Pressure ulcer of unspecified buttock, unspecified stage: Secondary | ICD-10-CM | POA: Diagnosis not present

## 2012-02-03 ENCOUNTER — Ambulatory Visit (INDEPENDENT_AMBULATORY_CARE_PROVIDER_SITE_OTHER): Payer: Medicare Other | Admitting: Urology

## 2012-02-03 DIAGNOSIS — N319 Neuromuscular dysfunction of bladder, unspecified: Secondary | ICD-10-CM | POA: Diagnosis not present

## 2012-02-04 ENCOUNTER — Other Ambulatory Visit: Payer: Self-pay | Admitting: Urology

## 2012-02-05 DIAGNOSIS — Z466 Encounter for fitting and adjustment of urinary device: Secondary | ICD-10-CM | POA: Diagnosis not present

## 2012-02-05 DIAGNOSIS — G35 Multiple sclerosis: Secondary | ICD-10-CM | POA: Diagnosis not present

## 2012-02-05 DIAGNOSIS — D649 Anemia, unspecified: Secondary | ICD-10-CM | POA: Diagnosis not present

## 2012-02-05 DIAGNOSIS — L89309 Pressure ulcer of unspecified buttock, unspecified stage: Secondary | ICD-10-CM | POA: Diagnosis not present

## 2012-02-05 DIAGNOSIS — R1310 Dysphagia, unspecified: Secondary | ICD-10-CM | POA: Diagnosis not present

## 2012-02-05 DIAGNOSIS — L8995 Pressure ulcer of unspecified site, unstageable: Secondary | ICD-10-CM | POA: Diagnosis not present

## 2012-02-09 DIAGNOSIS — L89309 Pressure ulcer of unspecified buttock, unspecified stage: Secondary | ICD-10-CM | POA: Diagnosis not present

## 2012-02-09 DIAGNOSIS — Z466 Encounter for fitting and adjustment of urinary device: Secondary | ICD-10-CM | POA: Diagnosis not present

## 2012-02-09 DIAGNOSIS — D649 Anemia, unspecified: Secondary | ICD-10-CM | POA: Diagnosis not present

## 2012-02-09 DIAGNOSIS — L8995 Pressure ulcer of unspecified site, unstageable: Secondary | ICD-10-CM | POA: Diagnosis not present

## 2012-02-09 DIAGNOSIS — G35 Multiple sclerosis: Secondary | ICD-10-CM | POA: Diagnosis not present

## 2012-02-09 DIAGNOSIS — R1310 Dysphagia, unspecified: Secondary | ICD-10-CM | POA: Diagnosis not present

## 2012-02-10 ENCOUNTER — Encounter (HOSPITAL_COMMUNITY): Payer: Self-pay | Admitting: Pulmonary Disease

## 2012-02-10 LAB — PULMONARY FUNCTION TEST

## 2012-02-11 DIAGNOSIS — I10 Essential (primary) hypertension: Secondary | ICD-10-CM | POA: Diagnosis not present

## 2012-02-11 DIAGNOSIS — L89309 Pressure ulcer of unspecified buttock, unspecified stage: Secondary | ICD-10-CM | POA: Diagnosis not present

## 2012-02-11 DIAGNOSIS — IMO0002 Reserved for concepts with insufficient information to code with codable children: Secondary | ICD-10-CM | POA: Diagnosis not present

## 2012-02-12 DIAGNOSIS — L89309 Pressure ulcer of unspecified buttock, unspecified stage: Secondary | ICD-10-CM | POA: Diagnosis not present

## 2012-02-12 DIAGNOSIS — R1310 Dysphagia, unspecified: Secondary | ICD-10-CM | POA: Diagnosis not present

## 2012-02-12 DIAGNOSIS — D649 Anemia, unspecified: Secondary | ICD-10-CM | POA: Diagnosis not present

## 2012-02-12 DIAGNOSIS — G35 Multiple sclerosis: Secondary | ICD-10-CM | POA: Diagnosis not present

## 2012-02-12 DIAGNOSIS — Z466 Encounter for fitting and adjustment of urinary device: Secondary | ICD-10-CM | POA: Diagnosis not present

## 2012-02-12 DIAGNOSIS — L8995 Pressure ulcer of unspecified site, unstageable: Secondary | ICD-10-CM | POA: Diagnosis not present

## 2012-02-13 ENCOUNTER — Ambulatory Visit (HOSPITAL_COMMUNITY)
Admission: RE | Admit: 2012-02-13 | Discharge: 2012-02-13 | Disposition: A | Discharge status: Home / Self Care | Payer: Medicare Other | Source: Ambulatory Visit | Attending: Family Medicine | Admitting: Family Medicine

## 2012-02-13 DIAGNOSIS — L89109 Pressure ulcer of unspecified part of back, unspecified stage: Secondary | ICD-10-CM | POA: Diagnosis not present

## 2012-02-13 DIAGNOSIS — L8993 Pressure ulcer of unspecified site, stage 3: Secondary | ICD-10-CM | POA: Insufficient documentation

## 2012-02-13 DIAGNOSIS — IMO0001 Reserved for inherently not codable concepts without codable children: Secondary | ICD-10-CM | POA: Diagnosis not present

## 2012-02-13 NOTE — Progress Notes (Signed)
Physical Therapy - Wound Therapy  Evaluation  Patient Details  Name: Jordan Ward MRN: [id number] Date of Birth: 1940-11-07  Today's Date: 02/13/2012 Time: 1025-1105 Time Calculation (min): 40 min Charges: 1 eval Visit#: 1  of 12   Re-eval: 03/14/12  SUBJECTIVE Pt is present today with his wife who reports history. Pt reports that her husband was placed in a SNF in March after suffering with C-diff. After being D/C from SNF she noticed a wound to his sacrum. He has being treated for a sacral wound since March with [facility] nursing and is now being referred to OP PT secondary to decreased healing. However his wife reports that she has seen his wound from the beginning and states she feels [facility] nursing was adressing it and making significant progress. She states that she was completing the dressing changes when nursing was unable to come out. They have appropriate padding at their home to decrease pressure and shear forces. Patient and Family Stated Goals Date of Onset 11/28/2011 Prior Treatments Pain Assessment: Pain Assessment Faces Faces Pain Scale Hurts even more Pain Type Pain Location Pain Orientation Right Pain Descriptors   Wound Therapy Pressure Ulcer 02/13/12 Stage III -  Full thickness tissue loss. Subcutaneous fat may be visible but bone, tendon or muscle are NOT exposed. Slough and Granulation tissue present (Active)  State of Healing Early/partial granulation 02/13/2012  1:18 PM  Site / Wound Assessment Clean;Bleeding;Granulation tissue;Yellow 02/13/2012  1:18 PM  % Wound base Red or Granulating 40% 02/13/2012  1:18 PM  % Wound base Yellow 60% 02/13/2012  1:18 PM  Wound Length (cm) 4.8 cm 02/13/2012  1:18 PM  Wound Width (cm) 2.5 cm 02/13/2012  1:18 PM  Wound Depth (cm) 0.3 cm 02/13/2012  1:18 PM  Tunneling (cm) 0.4 @ 5 O'clock 02/13/2012  1:18 PM  Margins Attached edges (approximated) 02/13/2012  1:18 PM  Drainage Amount Moderate 02/13/2012  1:18 PM  Drainage Description Serosanguineous  02/13/2012  1:18 PM  Treatment Cleansed;Debridement (Selective) 02/13/2012  1:18 PM  Dressing Type ABD;Gauze (Comment);Hydrogel;Moist to moist;Silver dressings;Tape dressing 02/13/2012  1:18 PM  Dressing Changed;Clean;Dry;Intact 02/13/2012  1:18 PM     Wound 10/02/11 Buttocks Posterior Excoriation to sacral/coccyx area of buttocks. Blanchable. (Active)     Wound 10/02/11 Abrasion(s) Penis Other (Comment) Abrasion noted to bottom of meatus of penis. Appears scabbed over. (Active)   Selective Debridement Selective Debridement - Location: to sacral wound Selective Debridement - Tools Used: Forceps;Scalpel;Scissors Selective Debridement - Tissue Removed: necrotic tissue   Physical Therapy Assessment and Plan Wound Therapy - Assess/Plan/Recommendations Wound Therapy - Clinical Statement: Jordan Ward is a 71 year old male referred to PT for a sacral ulcer.  He currently has impairments including increased necrotic tissue, decreased granulation tissue, increased length, width, depth and tunneling of the sacral wound and increase pain.  Pt will benefit from skilled OP PT to address impairments to reach goals.   Pt wife is concerned about transportation to OP PT as she works as a child care provider at her house and she is his only transportation.  She reports increased concern that she will have to quit her job, and they currently have financial difficulties. Recommend 3x/wk however due to transportation concerns, pt may only be able to come 1x/week Factors Delaying/Impairing Wound Healing: Altered sensation;Multiple medical problems Hydrotherapy Plan: Debridement;Dressing change;Electrical stimulation;Patient/family education;Pulsatile lavage with suction Wound Therapy - Frequency: 3X / week      Goals  Wound Therapy Goals - Improve the function of  patient's integumentary system by progressing the wound(s) through the phases of wound healing by:: Decrease Necrotic Tissue to 0% Decrease Necrotic Tissue -  Progress Goal set today Increase Granulation Tissue to 100% Increase Granulation Tissue - Progress Goal set today Decrease Length/Width/Depth by (cm) 1x1x0.1x0.1 Decrease Length/Width/Depth - Progress Goal set today Improve Drainage Characteristics Min Improve Drainage Characteristics - Progress Goal set today Patient/Family will be able to perform independent wound care Patient/Family Instruction Goal - Progress Goal set today Additional Wound Therapy Goal Additional Wound Therapy Goal - Progress Goals/treatment plan/discharge plan were made with and agreed upon by patient/family Yes Time For Goal Achievement Other (comment) Wound Therapy - Potential for Goals Fair   Problem List Patient Active Problem List  Diagnoses  . Chronic diarrhea  . Hx of adenomatous colonic polyps  . Anemia  . C. difficile diarrhea  . Hypokalemia  . Dehydration  . PNA (pneumonia) probable  . Junctional rhythm  . Multiple sclerosis  . UTI (lower urinary tract infection)  . HTN (hypertension), malignant  . Chronic indwelling foley catheter  . Pulmonary fibrosis  . Dysphagia  . Hypernatremia  . Tremors of nervous system  . Hypothyroidism  . Pulmonary nodule    GP Current Status  [phone] - Other PT/OT Primary CK - At least 40% but less than 60% impaired, limited or restricted   Goal Status  [phone] - Other PT/OT Primary CJ - At least 20% but less than 40% impaired, limited or restricted    Meris Reede 02/13/2012, 2:10 PM

## 2012-02-16 ENCOUNTER — Ambulatory Visit (HOSPITAL_COMMUNITY)
Admission: RE | Admit: 2012-02-16 | Discharge: 2012-02-16 | Disposition: A | Discharge status: Home / Self Care | Payer: Medicare Other | Source: Ambulatory Visit | Attending: Physician Assistant | Admitting: Physician Assistant

## 2012-02-16 DIAGNOSIS — L89109 Pressure ulcer of unspecified part of back, unspecified stage: Secondary | ICD-10-CM | POA: Diagnosis not present

## 2012-02-16 DIAGNOSIS — IMO0001 Reserved for inherently not codable concepts without codable children: Secondary | ICD-10-CM | POA: Diagnosis not present

## 2012-02-16 DIAGNOSIS — L8993 Pressure ulcer of unspecified site, stage 3: Secondary | ICD-10-CM | POA: Diagnosis not present

## 2012-02-16 NOTE — Progress Notes (Signed)
Physical Therapy - Wound Therapy  Treatment  Patient Details  Name: Jordan Ward MRN: [id number] Date of Birth: November 27, 1940  Today's Date: 02/16/2012 Time: 0930-1000 Time Calculation (min): 30 min  Visit#: 2  of 12   Re-eval: 03/12/12 Pt states that his wound started smelling and having significant drainage yesterday. Wound Therapy Pressure Ulcer 02/13/12 Stage III -  Full thickness tissue loss. Subcutaneous fat may be visible but bone, tendon or muscle are NOT exposed. Slough and Granulation tissue present (Active)  State of Healing Early/partial granulation 02/16/2012 10:35 AM  Site / Wound Assessment Clean;Granulation tissue 02/16/2012 10:35 AM  % Wound base Red or Granulating 45% 02/16/2012 10:35 AM  % Wound base Yellow 55% 02/16/2012 10:35 AM  Peri-wound Assessment Erythema (blanchable) 02/16/2012 10:35 AM  Wound Length (cm) 4.8 cm 02/13/2012  1:18 PM  Wound Width (cm) 2.5 cm 02/13/2012  1:18 PM  Wound Depth (cm) 0.3 cm 02/13/2012  1:18 PM  Tunneling (cm) 0.4 @ 5 O'clock 02/13/2012  1:18 PM  Margins Attached edges (approximated) 02/13/2012  1:18 PM  Drainage Amount Moderate 02/16/2012 10:35 AM  Drainage Description Sanguineous 02/16/2012 10:35 AM  Treatment Cleansed;Debridement (Selective) 02/16/2012 10:35 AM  Dressing Type Silver dressings 02/16/2012 10:35 AM  Dressing Changed 02/16/2012 10:35 AM     Wound 10/02/11 Buttocks Posterior Excoriation to sacral/coccyx area of buttocks. Blanchable. (Active)     Wound 10/02/11 Abrasion(s) Penis Other (Comment) Abrasion noted to bottom of meatus of penis. Appears scabbed over. (Active)   Selective Debridement Selective Debridement - Location: to sacral wound Selective Debridement - Tools Used: Forceps;Scalpel;Scissors Selective Debridement - Tissue Removed: slough   Physical Therapy Assessment and Plan Wound Therapy - Assess/Plan/Recommendations Wound Therapy - Clinical Statement: Mr. Vodicka encouraged to call MD to get antibiotic secondary to  increased foul smell and drainage.  Pt given [facility] number who will pick patient up but willl charge $4.00/visit.  Pt recieving HH therefore HH was called about pt being referred for out-patient visit.      Goals    Problem List Patient Active Problem List  Diagnoses  . Chronic diarrhea  . Hx of adenomatous colonic polyps  . Anemia  . C. difficile diarrhea  . Hypokalemia  . Dehydration  . PNA (pneumonia) probable  . Junctional rhythm  . Multiple sclerosis  . UTI (lower urinary tract infection)  . HTN (hypertension), malignant  . Chronic indwelling foley catheter  . Pulmonary fibrosis  . Dysphagia  . Hypernatremia  . Tremors of nervous system  . Hypothyroidism  . Pulmonary nodule    GP No functional reporting required  Elexius Minar,CINDY 02/16/2012, 10:50 AM

## 2012-02-18 DIAGNOSIS — L8995 Pressure ulcer of unspecified site, unstageable: Secondary | ICD-10-CM | POA: Diagnosis not present

## 2012-02-18 DIAGNOSIS — Z466 Encounter for fitting and adjustment of urinary device: Secondary | ICD-10-CM | POA: Diagnosis not present

## 2012-02-18 DIAGNOSIS — D649 Anemia, unspecified: Secondary | ICD-10-CM | POA: Diagnosis not present

## 2012-02-18 DIAGNOSIS — R1310 Dysphagia, unspecified: Secondary | ICD-10-CM | POA: Diagnosis not present

## 2012-02-18 DIAGNOSIS — G35 Multiple sclerosis: Secondary | ICD-10-CM | POA: Diagnosis not present

## 2012-02-18 DIAGNOSIS — L89309 Pressure ulcer of unspecified buttock, unspecified stage: Secondary | ICD-10-CM | POA: Diagnosis not present

## 2012-02-19 ENCOUNTER — Ambulatory Visit (HOSPITAL_COMMUNITY)
Admission: RE | Admit: 2012-02-19 | Discharge: 2012-02-19 | Disposition: A | Discharge status: Home / Self Care | Payer: Medicare Other | Source: Ambulatory Visit | Attending: Physician Assistant | Admitting: Physician Assistant

## 2012-02-19 DIAGNOSIS — L89109 Pressure ulcer of unspecified part of back, unspecified stage: Secondary | ICD-10-CM | POA: Diagnosis not present

## 2012-02-19 DIAGNOSIS — L8993 Pressure ulcer of unspecified site, stage 3: Secondary | ICD-10-CM | POA: Diagnosis not present

## 2012-02-19 DIAGNOSIS — IMO0001 Reserved for inherently not codable concepts without codable children: Secondary | ICD-10-CM | POA: Diagnosis not present

## 2012-02-19 NOTE — Progress Notes (Signed)
Physical Therapy - Wound Therapy  Treatment  Patient Details  Name: Jordan Ward MRN: [id number] Date of Birth: 02-15-1941  Today's Date: 02/19/2012 Time: 1018-1050 Time Calculation (min): 32 min  Visit#: 3  of 12   Re-eval: 03/12/12 Selective debridement < 20 cm Wound Therapy  02/19/12 1049  Subjective Assessment  Subjective Pt stated high pain level today, scale 8/10 today.  Pt c/o drainage through dressings and foul order.  Pt stated he was currently taking antibiotics.  Pressure Ulcer 02/13/12 Stage III -  Full thickness tissue loss. Subcutaneous fat may be visible but bone, tendon or muscle are NOT exposed. Slough and Granulation tissue present  Date First Assessed: 02/13/12   Location: Sacrum  Location Orientation: Left  Staging: Stage III -  Full thickness tissue loss. Subcutaneous fat may be visible but bone, tendon or muscle are NOT exposed.  Wound Description (Comments): Slough and Granulat  State of Healing Early/partial granulation  Site / Wound Assessment Clean;Granulation tissue  % Wound base Red or Granulating 55%  % Wound base Yellow 45% (adherent)  Peri-wound Assessment Erythema (blanchable)  Drainage Amount Moderate  Drainage Description Sanguineous (foul order)  Treatment Cleansed;Debridement (Selective);Hydrotherapy (Pulse lavage)  Dressing Type Silver hydrofiber  Dressing Changed  Selective Debridement  Selective Debridement - Location to R sacral wound  Selective Debridement - Tools Used Forceps;Scalpel;Scissors  Selective Debridement - Tissue Removed slough  Wound Therapy - Assess/Plan/Recommendations  Wound Therapy - Clinical Statement Changed silver dressings to silver hydrofiber to address the excess drainage.  Able to remove 10% more yellow slough with scalpel and gauze with no c/o pain during debridement.  Pt stated wound felt better at end of session.  Hydrotherapy Plan Debridement;Dressing change;Electrical stimulation;Patient/family  education;Pulsatile lavage with suction      Selective Debridement Selective Debridement - Location: to R sacral wound Selective Debridement - Tools Used: Forceps;Scalpel;Scissors Selective Debridement - Tissue Removed: slough   Physical Therapy Assessment and Plan Wound Therapy - Assess/Plan/Recommendations Wound Therapy - Clinical Statement: Changed silver dressings to silver hydrofiber to address the excess drainage.  Able to remove 10% more yellow slough with scalpel and gauze with no c/o pain during debridement.  Pt stated wound felt better at end of session. Hydrotherapy Plan: Debridement;Dressing change;Electrical stimulation;Patient/family education;Pulsatile lavage with suction      Goals    Problem List Patient Active Problem List  Diagnoses  . Chronic diarrhea  . Hx of adenomatous colonic polyps  . Anemia  . C. difficile diarrhea  . Hypokalemia  . Dehydration  . PNA (pneumonia) probable  . Junctional rhythm  . Multiple sclerosis  . UTI (lower urinary tract infection)  . HTN (hypertension), malignant  . Chronic indwelling foley catheter  . Pulmonary fibrosis  . Dysphagia  . Hypernatremia  . Tremors of nervous system  . Hypothyroidism  . Pulmonary nodule    GP No functional reporting required  Aldona Lento, PTA 02/19/2012, 11:03 AM

## 2012-02-23 DIAGNOSIS — Z466 Encounter for fitting and adjustment of urinary device: Secondary | ICD-10-CM | POA: Diagnosis not present

## 2012-02-23 DIAGNOSIS — G35 Multiple sclerosis: Secondary | ICD-10-CM | POA: Diagnosis not present

## 2012-02-23 DIAGNOSIS — L8995 Pressure ulcer of unspecified site, unstageable: Secondary | ICD-10-CM | POA: Diagnosis not present

## 2012-02-23 DIAGNOSIS — D649 Anemia, unspecified: Secondary | ICD-10-CM | POA: Diagnosis not present

## 2012-02-23 DIAGNOSIS — L89309 Pressure ulcer of unspecified buttock, unspecified stage: Secondary | ICD-10-CM | POA: Diagnosis not present

## 2012-02-23 DIAGNOSIS — R1310 Dysphagia, unspecified: Secondary | ICD-10-CM | POA: Diagnosis not present

## 2012-02-24 ENCOUNTER — Ambulatory Visit (INDEPENDENT_AMBULATORY_CARE_PROVIDER_SITE_OTHER): Payer: Medicare Other | Admitting: Urology

## 2012-02-24 ENCOUNTER — Ambulatory Visit (HOSPITAL_COMMUNITY): Payer: Medicare Other

## 2012-02-24 DIAGNOSIS — N319 Neuromuscular dysfunction of bladder, unspecified: Secondary | ICD-10-CM | POA: Diagnosis not present

## 2012-02-25 ENCOUNTER — Ambulatory Visit (HOSPITAL_COMMUNITY)
Admission: RE | Admit: 2012-02-25 | Discharge: 2012-02-25 | Disposition: A | Discharge status: Home / Self Care | Payer: Medicare Other | Source: Ambulatory Visit | Attending: Physician Assistant | Admitting: Physician Assistant

## 2012-02-25 DIAGNOSIS — L8993 Pressure ulcer of unspecified site, stage 3: Secondary | ICD-10-CM | POA: Diagnosis not present

## 2012-02-25 DIAGNOSIS — L89109 Pressure ulcer of unspecified part of back, unspecified stage: Secondary | ICD-10-CM | POA: Diagnosis not present

## 2012-02-25 DIAGNOSIS — IMO0001 Reserved for inherently not codable concepts without codable children: Secondary | ICD-10-CM | POA: Diagnosis not present

## 2012-02-25 NOTE — Progress Notes (Signed)
Physical Therapy - Wound Therapy  Treatment  Patient Details  Name: Jordan Ward MRN: [id number] Date of Birth: 20-Jan-1941  Today's Date: 02/25/2012 Time: [id number] Time Calculation (min): 33 min  Visit#: 4  of 12   Re-eval: 03/12/12 Selective debridement < 20 cm Wound Therapy  02/25/12 1000  Pressure Ulcer 02/13/12 Stage III -  Full thickness tissue loss. Subcutaneous fat may be visible but bone, tendon or muscle are NOT exposed. Slough and Granulation tissue present  Date First Assessed: 02/13/12   Location: Sacrum  Location Orientation: Left  Staging: Stage III -  Full thickness tissue loss. Subcutaneous fat may be visible but bone, tendon or muscle are NOT exposed.  Wound Description (Comments): Slough and Granulat  State of Healing Early/partial granulation  Site / Wound Assessment Clean;Granulation tissue  % Wound base Red or Granulating 60%  % Wound base Yellow 40% (adherent)  Peri-wound Assessment Erythema (blanchable)  Drainage Amount Moderate  Drainage Description Sanguineous;No odor  Treatment Cleansed;Debridement (Selective);Hydrotherapy (Pulse lavage)  Dressing Type Silver hydrofiber  Dressing Changed  Selective Debridement  Selective Debridement - Location to R sacral wound  Selective Debridement - Tools Used Forceps;Scalpel  Selective Debridement - Tissue Removed slough  Wound Therapy - Assess/Plan/Recommendations  Wound Therapy - Clinical Statement No odor noted this session, pt did c/o odor over weekend.  Yellow slough very adherent, able to remove 5% more without c/o discomfort from pt. during debridement.  Hydrotherapy Plan Debridement;Dressing change;Pulsatile lavage with suction     Selective Debridement Selective Debridement - Location: to R sacral wound Selective Debridement - Tools Used: Forceps;Scalpel Selective Debridement - Tissue Removed: slough   Physical Therapy Assessment and Plan Wound Therapy - Assess/Plan/Recommendations Wound Therapy -  Clinical Statement: No odor noted this session, pt did c/o odor over weekend.  Yellow slough very adherent, able to remove 5% more without c/o discomfort from pt. during debridement. Hydrotherapy Plan: Debridement;Dressing change;Pulsatile lavage with suction      Goals    Problem List Patient Active Problem List  Diagnoses  . Chronic diarrhea  . Hx of adenomatous colonic polyps  . Anemia  . C. difficile diarrhea  . Hypokalemia  . Dehydration  . PNA (pneumonia) probable  . Junctional rhythm  . Multiple sclerosis  . UTI (lower urinary tract infection)  . HTN (hypertension), malignant  . Chronic indwelling foley catheter  . Pulmonary fibrosis  . Dysphagia  . Hypernatremia  . Tremors of nervous system  . Hypothyroidism  . Pulmonary nodule    GP No functional reporting required  Aldona Lento, PTA 02/25/2012, 10:49 AM

## 2012-02-26 ENCOUNTER — Ambulatory Visit (HOSPITAL_COMMUNITY): Payer: Medicare Other

## 2012-02-27 ENCOUNTER — Ambulatory Visit (HOSPITAL_COMMUNITY)
Admission: RE | Admit: 2012-02-27 | Discharge: 2012-02-27 | Disposition: A | Discharge status: Home / Self Care | Payer: Medicare Other | Source: Ambulatory Visit | Attending: Physician Assistant | Admitting: Physician Assistant

## 2012-02-27 DIAGNOSIS — L8993 Pressure ulcer of unspecified site, stage 3: Secondary | ICD-10-CM | POA: Diagnosis not present

## 2012-02-27 DIAGNOSIS — L89309 Pressure ulcer of unspecified buttock, unspecified stage: Secondary | ICD-10-CM | POA: Diagnosis not present

## 2012-02-27 DIAGNOSIS — G35 Multiple sclerosis: Secondary | ICD-10-CM | POA: Diagnosis not present

## 2012-02-27 DIAGNOSIS — L8995 Pressure ulcer of unspecified site, unstageable: Secondary | ICD-10-CM | POA: Diagnosis not present

## 2012-02-27 DIAGNOSIS — IMO0001 Reserved for inherently not codable concepts without codable children: Secondary | ICD-10-CM | POA: Diagnosis not present

## 2012-02-27 DIAGNOSIS — D649 Anemia, unspecified: Secondary | ICD-10-CM | POA: Diagnosis not present

## 2012-02-27 DIAGNOSIS — Z466 Encounter for fitting and adjustment of urinary device: Secondary | ICD-10-CM | POA: Diagnosis not present

## 2012-02-27 DIAGNOSIS — R1310 Dysphagia, unspecified: Secondary | ICD-10-CM | POA: Diagnosis not present

## 2012-02-27 DIAGNOSIS — L89109 Pressure ulcer of unspecified part of back, unspecified stage: Secondary | ICD-10-CM | POA: Diagnosis not present

## 2012-02-27 NOTE — Progress Notes (Signed)
Physical Therapy - Wound Therapy  Treatment   Patient Details  Name: Jordan Ward MRN: [id number] Date of Birth: 1941/05/12  Today's Date: 02/27/2012 Time: [id number] Time Calculation (min): 32 min Charges: Debridement  Visit#: 5  of 12   Re-eval: 03/12/12  Subjective Subjective Assessment Subjective: Pt reports that his pain is worse after he leaves, but does not report pain during treatment.  Date of Onset: 11/28/11  Pain Assessment Pain Assessment Pain Assessment: No/denies pain  Wound Therapy  02/27/12 1153  Pain Assessment  Pain Assessment No/denies pain     Hydrotherapy Pulsed lavage therapy - wound location: To R sacral wound  Pulsed Lavage with Suction (psi): 200 psi Pulsed Lavage with Suction - Normal Saline Used: 500 mL Pulsed Lavage Tip: Tip with splash shield Selective Debridement Selective Debridement - Location: to R sacral wound Selective Debridement - Tools Used: Forceps;Scalpel;Other (comment) (Q-tip) Selective Debridement - Tissue Removed: slough   Physical Therapy Assessment and Plan Wound Therapy - Assess/Plan/Recommendations Wound Therapy - Clinical Statement: Pt continues to have mild purluent odor, which became more apparent after selective debridement over the middle of the wound where most of necrotic tissue was located.  Able to debrid lower and extract slough.  Used silver hydrofiller secondary to moderate draniage.  He continues to demonstrate improved granulation and decreased length, however continues to have tunneling to that location with slough.       Goals    Problem List Patient Active Problem List  Diagnoses  . Chronic diarrhea  . Hx of adenomatous colonic polyps  . Anemia  . C. difficile diarrhea  . Hypokalemia  . Dehydration  . PNA (pneumonia) probable  . Junctional rhythm  . Multiple sclerosis  . UTI (lower urinary tract infection)  . HTN (hypertension), malignant  . Chronic indwelling foley catheter  . Pulmonary  fibrosis  . Dysphagia  . Hypernatremia  . Tremors of nervous system  . Hypothyroidism  . Pulmonary nodule    GP No functional reporting required  Jordan Ward 02/27/2012, 12:02 PM

## 2012-03-01 ENCOUNTER — Other Ambulatory Visit: Payer: Self-pay

## 2012-03-01 DIAGNOSIS — L8995 Pressure ulcer of unspecified site, unstageable: Secondary | ICD-10-CM | POA: Diagnosis not present

## 2012-03-01 DIAGNOSIS — L89309 Pressure ulcer of unspecified buttock, unspecified stage: Secondary | ICD-10-CM | POA: Diagnosis not present

## 2012-03-01 DIAGNOSIS — R1310 Dysphagia, unspecified: Secondary | ICD-10-CM | POA: Diagnosis not present

## 2012-03-01 DIAGNOSIS — G35 Multiple sclerosis: Secondary | ICD-10-CM | POA: Diagnosis not present

## 2012-03-01 DIAGNOSIS — D649 Anemia, unspecified: Secondary | ICD-10-CM | POA: Diagnosis not present

## 2012-03-01 DIAGNOSIS — Z466 Encounter for fitting and adjustment of urinary device: Secondary | ICD-10-CM | POA: Diagnosis not present

## 2012-03-01 MED ORDER — CHOLESTYRAMINE 4 G PO PACK: 1.0000 | PACK | Freq: Three times a day (TID) | ORAL | Status: DC

## 2012-03-01 NOTE — Telephone Encounter (Signed)
Pt due for OV FU

## 2012-03-02 ENCOUNTER — Ambulatory Visit (HOSPITAL_COMMUNITY)
Admission: RE | Admit: 2012-03-02 | Discharge: 2012-03-02 | Disposition: A | Discharge status: Home / Self Care | Payer: Medicare Other | Source: Ambulatory Visit | Attending: Family Medicine | Admitting: Family Medicine

## 2012-03-02 ENCOUNTER — Encounter: Payer: Self-pay | Admitting: Internal Medicine

## 2012-03-02 DIAGNOSIS — L89109 Pressure ulcer of unspecified part of back, unspecified stage: Secondary | ICD-10-CM | POA: Insufficient documentation

## 2012-03-02 DIAGNOSIS — L8993 Pressure ulcer of unspecified site, stage 3: Secondary | ICD-10-CM | POA: Insufficient documentation

## 2012-03-02 DIAGNOSIS — IMO0001 Reserved for inherently not codable concepts without codable children: Secondary | ICD-10-CM | POA: Insufficient documentation

## 2012-03-02 NOTE — Progress Notes (Signed)
Physical Therapy - Wound Therapy  Treatment   Patient Details  Name: Jordan Ward MRN: [id number] Date of Birth: 1940-10-21  Today's Date: 03/02/2012 Time:9:42-10:15 Visit#: 6  of 12   Re-eval: 03/12/12 Charges:  Suzi Roots <20cm  Subjective Subjective Assessment Subjective: Pt. states he was hurting last night.  Currently OK.  Pain Assessment Pain Assessment Pain Assessment: No/denies pain  Wound Therapy Pressure Ulcer 02/13/12 Stage III -  Full thickness tissue loss. Subcutaneous fat may be visible but bone, tendon or muscle are NOT exposed. Slough and Granulation tissue present (Active)  State of Healing Early/partial granulation 03/02/2012 10:31 AM  Site / Wound Assessment Clean;Granulation tissue;Yellow 03/02/2012 10:31 AM  % Wound base Red or Granulating 65% 03/02/2012 10:31 AM  % Wound base Yellow 30% 03/02/2012 10:31 AM  Peri-wound Assessment Erythema (blanchable) 02/25/2012 10:00 AM  Wound Length (cm) 4.8 cm 02/13/2012  1:18 PM  Wound Width (cm) 2.5 cm 02/13/2012  1:18 PM  Wound Depth (cm) 0.3 cm 02/13/2012  1:18 PM  Tunneling (cm) 0.4 @ 5 O'clock 02/13/2012  1:18 PM  Margins Attached edges (approximated) 02/27/2012 11:54 AM  Drainage Amount Moderate 03/02/2012 10:31 AM  Drainage Description Sanguineous;No odor 02/25/2012 10:00 AM  Treatment Hydrotherapy (Pulse lavage);Debridement (Selective) 03/02/2012 10:31 AM  Dressing Type Silver hydrofiber;Gauze (Comment);ABD 03/02/2012 10:31 AM  Dressing Changed;Clean;Dry;Intact 03/02/2012 10:31 AM     Wound 10/02/11 Buttocks Posterior Excoriation to sacral/coccyx area of buttocks. Blanchable. (Active)     Wound 10/02/11 Abrasion(s) Penis Other (Comment) Abrasion noted to bottom of meatus of penis. Appears scabbed over. (Active)   Hydrotherapy Pulsed lavage therapy - wound location: To R sacral wound  Pulsed Lavage with Suction (psi): 5 psi Pulsed Lavage with Suction - Normal Saline Used: 1000 mL Pulsed Lavage Tip: Tip with splash shield Selective  Debridement Selective Debridement - Location: to R sacral wound Selective Debridement - Tools Used: Forceps;Scalpel;Other (comment) Selective Debridement - Tissue Removed: slough   Physical Therapy Assessment and Plan Wound Therapy - Assess/Plan/Recommendations Wound Therapy - Clinical Statement: No purulent drainage today; Reports his home nurse is going to order silver hydrofiber as well. Hydrotherapy Plan: Pulsatile lavage with suction;Debridement;Dressing change      Problem List Patient Active Problem List  Diagnoses  . Chronic diarrhea  . Hx of adenomatous colonic polyps  . Anemia  . C. difficile diarrhea  . Hypokalemia  . Dehydration  . PNA (pneumonia) probable  . Junctional rhythm  . Multiple sclerosis  . UTI (lower urinary tract infection)  . HTN (hypertension), malignant  . Chronic indwelling foley catheter  . Pulmonary fibrosis  . Dysphagia  . Hypernatremia  . Tremors of nervous system  . Hypothyroidism  . Pulmonary nodule    GP No functional reporting required  Teena Irani, PTA/CLT 03/02/2012, 10:37 AM

## 2012-03-02 NOTE — Telephone Encounter (Signed)
Mailed letter for patient to call me to set up OV to further his refills

## 2012-03-04 ENCOUNTER — Ambulatory Visit (HOSPITAL_COMMUNITY): Payer: Medicare Other | Admitting: Physical Therapy

## 2012-03-04 DIAGNOSIS — J8410 Pulmonary fibrosis, unspecified: Secondary | ICD-10-CM | POA: Diagnosis not present

## 2012-03-04 DIAGNOSIS — I10 Essential (primary) hypertension: Secondary | ICD-10-CM | POA: Diagnosis not present

## 2012-03-04 DIAGNOSIS — G35 Multiple sclerosis: Secondary | ICD-10-CM | POA: Diagnosis not present

## 2012-03-05 ENCOUNTER — Ambulatory Visit (HOSPITAL_COMMUNITY)
Admission: RE | Admit: 2012-03-05 | Discharge: 2012-03-05 | Disposition: A | Discharge status: Home / Self Care | Payer: Medicare Other | Source: Ambulatory Visit | Attending: Physician Assistant | Admitting: Physician Assistant

## 2012-03-05 DIAGNOSIS — L8993 Pressure ulcer of unspecified site, stage 3: Secondary | ICD-10-CM | POA: Diagnosis not present

## 2012-03-05 DIAGNOSIS — L89109 Pressure ulcer of unspecified part of back, unspecified stage: Secondary | ICD-10-CM | POA: Diagnosis not present

## 2012-03-05 DIAGNOSIS — IMO0001 Reserved for inherently not codable concepts without codable children: Secondary | ICD-10-CM | POA: Diagnosis not present

## 2012-03-05 NOTE — Progress Notes (Signed)
Physical Therapy - Wound Therapy  Treatment   Patient Details  Name: Jordan Ward MRN: [id number] Date of Birth: 11/15/1940  Today's Date: 03/05/2012 Time: 1105-1140 Time Calculation (min): 35 min  Visit#: 7  of 12   Re-eval: 03/12/12 Selective debridement < 20 cm  Subjective Subjective Assessment Subjective: Pt reported no current pain, wife availabilty with transportation is limited, she is interested in receiving wound care as home health.  Pain Assessment Pain Assessment Pain Assessment: No/denies pain  Wound Therapy  03/05/12 1200  Subjective Assessment  Subjective Pt reported no current pain, wife availabilty with transportation is limited, she is interested in receiving wound care as home health.  Pressure Ulcer 02/13/12 Stage III -  Full thickness tissue loss. Subcutaneous fat may be visible but bone, tendon or muscle are NOT exposed. Slough and Granulation tissue present  Date First Assessed: 02/13/12   Location: Sacrum  Location Orientation: Right  Staging: Stage III -  Full thickness tissue loss. Subcutaneous fat may be visible but bone, tendon or muscle are NOT exposed.  Wound Description (Comments): Slough and Granula  State of Healing Early/partial granulation  Site / Wound Assessment Clean;Granulation tissue;Yellow  % Wound base Red or Granulating 70%  % Wound base Yellow 30%  Drainage Amount Moderate  Drainage Description Serosanguineous  Dressing Type Silver hydrofiber;Gauze (Comment);ABD  Dressing Changed;Clean;Dry;Intact  Hydrotherapy  Pulsed lavage therapy - wound location To R sacral wound   Pulsed Lavage with Suction (psi) 5 psi  Pulsed Lavage with Suction - Normal Saline Used 1000 mL  Pulsed Lavage Tip Tip with splash shield  Selective Debridement  Selective Debridement - Location to R sacral wound  Selective Debridement - Tools Used Forceps;Scalpel;Other (comment)  Selective Debridement - Tissue Removed slough  Wound Therapy -  Assess/Plan/Recommendations  Wound Therapy - Clinical Statement Pt tolerated total wound care without c/o.  Yellow slough still adherent, able to remove 5% more with debridement with scalpel and forceps.  No purulent drainage today.  Hydrotherapy Plan Pulsatile lavage with suction;Debridement;Dressing change     Hydrotherapy Pulsed lavage therapy - wound location: To R sacral wound  Pulsed Lavage with Suction (psi): 5 psi Pulsed Lavage with Suction - Normal Saline Used: 1000 mL Pulsed Lavage Tip: Tip with splash shield Selective Debridement Selective Debridement - Location: to R sacral wound Selective Debridement - Tools Used: Forceps;Scalpel;Other (comment) Selective Debridement - Tissue Removed: slough   Physical Therapy Assessment and Plan Wound Therapy - Assess/Plan/Recommendations Wound Therapy - Clinical Statement: Pt tolerated total wound care without c/o.  Yellow slough still adherent, able to remove 5% more with debridement with scalpel and forceps.  No purulent drainage today. Hydrotherapy Plan: Pulsatile lavage with suction;Debridement;Dressing change      Goals    Problem List Patient Active Problem List  Diagnoses  . Chronic diarrhea  . Hx of adenomatous colonic polyps  . Anemia  . C. difficile diarrhea  . Hypokalemia  . Dehydration  . PNA (pneumonia) probable  . Junctional rhythm  . Multiple sclerosis  . UTI (lower urinary tract infection)  . HTN (hypertension), malignant  . Chronic indwelling foley catheter  . Pulmonary fibrosis  . Dysphagia  . Hypernatremia  . Tremors of nervous system  . Hypothyroidism  . Pulmonary nodule    GP No functional reporting required  Aldona Lento, PTA 03/05/2012, 12:03 PM

## 2012-03-08 ENCOUNTER — Ambulatory Visit (HOSPITAL_COMMUNITY)
Admission: RE | Admit: 2012-03-08 | Discharge: 2012-03-08 | Disposition: A | Discharge status: Home / Self Care | Payer: Medicare Other | Source: Ambulatory Visit | Attending: Physician Assistant | Admitting: Physician Assistant

## 2012-03-08 DIAGNOSIS — L89109 Pressure ulcer of unspecified part of back, unspecified stage: Secondary | ICD-10-CM | POA: Diagnosis not present

## 2012-03-08 DIAGNOSIS — IMO0001 Reserved for inherently not codable concepts without codable children: Secondary | ICD-10-CM | POA: Diagnosis not present

## 2012-03-08 DIAGNOSIS — L8993 Pressure ulcer of unspecified site, stage 3: Secondary | ICD-10-CM | POA: Diagnosis not present

## 2012-03-08 NOTE — Progress Notes (Signed)
Physical Therapy - Wound Therapy  Evaluation   Patient Details  Name: Jordan Ward MRN: [id number] Date of Birth: 11/20/1940  Today's Date: 03/08/2012 Time: [id number] Time Calculation (min): 25 min Charges: Wound < 20 cm Visit#: 8  of 12   Re-eval: 04/07/12  Subjective Subjective Assessment Subjective: I was really wet after the last time.  Continue to state that [facility] is coming to his house.   Pain Assessment Pain Assessment Pain Assessment: No/denies pain  Wound Therapy Pressure Ulcer 02/13/12 Stage III -  Full thickness tissue loss. Subcutaneous fat may be visible but bone, tendon or muscle are NOT exposed. Slough and Granulation tissue present (Active)  State of Healing Early/partial granulation 03/05/2012 12:00 PM  Site / Wound Assessment Clean;Granulation tissue;Yellow 03/05/2012 12:00 PM  % Wound base Red or Granulating 75% 03/08/2012  1:08 PM  % Wound base Yellow 25% 03/08/2012  1:08 PM  Peri-wound Assessment Erythema (blanchable) 02/25/2012 10:00 AM  Wound Length (cm) 3.6 cm 03/08/2012  1:08 PM  Wound Width (cm) 3 cm 03/08/2012  1:08 PM  Wound Depth (cm) 0.5 cm 03/08/2012  1:08 PM  Tunneling (cm) 0.4 @ 5 O'clock 02/13/2012  1:18 PM  Undermining (cm) 0.4 (was 0.4) 03/08/2012  1:08 PM  Margins Attached edges (approximated) 02/27/2012 11:54 AM  Drainage Amount Minimal 03/08/2012  1:08 PM  Drainage Description Serosanguineous;Purulent;Odor 03/08/2012  1:08 PM  Treatment Cleansed;Debridement (Selective);Hydrotherapy (Pulse lavage) 03/08/2012  1:08 PM  Dressing Type Island dressing;Silver hydrofiber 03/08/2012  1:08 PM  Dressing Changed;Clean;Dry;Intact 03/05/2012 12:00 PM     Wound 10/02/11 Buttocks Posterior Excoriation to sacral/coccyx area of buttocks. Blanchable. (Active)     Wound 10/02/11 Abrasion(s) Penis Other (Comment) Abrasion noted to bottom of meatus of penis. Appears scabbed over. (Active)   Hydrotherapy Pulsed lavage therapy - wound location: To R sacral wound  Pulsed  Lavage with Suction (psi): 5 psi Pulsed Lavage with Suction - Normal Saline Used: 1000 mL Pulsed Lavage Tip: Tip with splash shield Selective Debridement Selective Debridement - Location: to R sacral wound  Selective Debridement - Tools Used: Forceps;Scalpel Selective Debridement - Tissue Removed: slough   Physical Therapy Assessment and Plan Wound Therapy - Assess/Plan/Recommendations Wound Therapy - Clinical Statement: G-code updated. Re-evaluation sent to [facility]. Pt continues to have increased odor to sacarl wound that is present with debridement of middle slough region.  Has increased depth secondary to easier slough removal to middle of sacral area. He continues to recieve [place] services for dressing changes. Continue to apply silver hydrofiber dressing to assist with the drainage and odor.  Overall slogh remains adherent,and wound remains a light pink color. Him and his wife remain concerned about being able to attend outpatient appointments secondary to her work situation.  Factors Delaying/Impairing Wound Healing: Altered sensation;Multiple medical problems Hydrotherapy Plan: Debridement;Dressing change;Electrical stimulation;Patient/family education;Pulsatile lavage with suction (Laser) Wound Therapy - Frequency: 3X / week Wound Plan: Continue with pulse lavage, may include either laser or e-stim next visit to help stimulte blood supply.       Goals Wound Therapy Goals - Improve the function of patient's integumentary system by progressing the wound(s) through the phases of wound healing by: Decrease Necrotic Tissue to: 0% Decrease Necrotic Tissue - Progress: Progressing toward goal Increase Granulation Tissue to: 100% Increase Granulation Tissue - Progress: Progressing toward goal Decrease Length/Width/Depth by (cm): 1x1x0.1x0.1 Decrease Length/Width/Depth - Progress: Progressing toward goal Improve Drainage Characteristics: Min Improve Drainage Characteristics -  Progress: Progressing toward goal Patient/Family will be  able to : perform independent wound care Patient/Family Instruction Goal - Progress: Progressing toward goal Goals/treatment plan/discharge plan were made with and agreed upon by patient/family: Yes (and wife Joann) Time For Goal Achievement: Other (comment) (12 weeks) Wound Therapy - Potential for Goals: Fair (secondary to transportation issues)  Problem List Patient Active Problem List  Diagnoses  . Chronic diarrhea  . Hx of adenomatous colonic polyps  . Anemia  . C. difficile diarrhea  . Hypokalemia  . Dehydration  . PNA (pneumonia) probable  . Junctional rhythm  . Multiple sclerosis  . UTI (lower urinary tract infection)  . HTN (hypertension), malignant  . Chronic indwelling foley catheter  . Pulmonary fibrosis  . Dysphagia  . Hypernatremia  . Tremors of nervous system  . Hypothyroidism  . Pulmonary nodule    GP Current Status  [id number] - Other PT/OT Primary CK - At least 40% but less than 60% impaired, limited or restricted   Goal Status  [phone] - Other PT/OT Primary CJ - At least 20% but less than 40% impaired, limited or restricted    Jordan Ward 03/08/2012, 1:37 PM

## 2012-03-09 ENCOUNTER — Ambulatory Visit (HOSPITAL_COMMUNITY): Payer: Medicare Other | Admitting: *Deleted

## 2012-03-09 DIAGNOSIS — L8995 Pressure ulcer of unspecified site, unstageable: Secondary | ICD-10-CM | POA: Diagnosis not present

## 2012-03-09 DIAGNOSIS — R1310 Dysphagia, unspecified: Secondary | ICD-10-CM | POA: Diagnosis not present

## 2012-03-09 DIAGNOSIS — L89309 Pressure ulcer of unspecified buttock, unspecified stage: Secondary | ICD-10-CM | POA: Diagnosis not present

## 2012-03-09 DIAGNOSIS — G35 Multiple sclerosis: Secondary | ICD-10-CM | POA: Diagnosis not present

## 2012-03-09 DIAGNOSIS — Z466 Encounter for fitting and adjustment of urinary device: Secondary | ICD-10-CM | POA: Diagnosis not present

## 2012-03-09 DIAGNOSIS — D649 Anemia, unspecified: Secondary | ICD-10-CM | POA: Diagnosis not present

## 2012-03-11 ENCOUNTER — Ambulatory Visit (HOSPITAL_COMMUNITY): Payer: Medicare Other | Admitting: *Deleted

## 2012-03-12 ENCOUNTER — Ambulatory Visit (HOSPITAL_COMMUNITY)
Admission: RE | Admit: 2012-03-12 | Discharge: 2012-03-12 | Disposition: A | Discharge status: Home / Self Care | Payer: Medicare Other | Source: Ambulatory Visit | Attending: Physician Assistant | Admitting: Physician Assistant

## 2012-03-12 DIAGNOSIS — L8993 Pressure ulcer of unspecified site, stage 3: Secondary | ICD-10-CM | POA: Diagnosis not present

## 2012-03-12 DIAGNOSIS — L89109 Pressure ulcer of unspecified part of back, unspecified stage: Secondary | ICD-10-CM | POA: Diagnosis not present

## 2012-03-12 DIAGNOSIS — IMO0001 Reserved for inherently not codable concepts without codable children: Secondary | ICD-10-CM | POA: Diagnosis not present

## 2012-03-12 NOTE — Progress Notes (Signed)
Physical Therapy - Wound Therapy  Treatment   Patient Details  Name: Jordan Ward MRN: [id number] Date of Birth: 1941/02/01  Today's Date: 03/12/2012 Time: [id number] Time Calculation (min): 23 min  Visit#: 9  of 12   Re-eval: 04/07/12 Charges: Selective debridement (= or < 20 cm)   Subjective Subjective Assessment Subjective: "I'm very sore today."  Pain Assessment Pain Assessment Pain Score:   6 Pain Location: Buttocks Pain Orientation: Right  Wound Therapy Pressure Ulcer 02/13/12 Stage III -  Full thickness tissue loss. Subcutaneous fat may be visible but bone, tendon or muscle are NOT exposed. Slough and Granulation tissue present (Active)  State of Healing Early/partial granulation 03/05/2012 12:00 PM  Site / Wound Assessment Clean;Granulation tissue;Yellow 03/12/2012  5:07 PM  % Wound base Red or Granulating 75% 03/12/2012  5:07 PM  % Wound base Yellow 25% 03/12/2012  5:07 PM  Peri-wound Assessment Erythema (blanchable) 02/25/2012 10:00 AM  Wound Length (cm) 3.6 cm 03/08/2012  1:08 PM  Wound Width (cm) 3 cm 03/08/2012  1:08 PM  Wound Depth (cm) 0.5 cm 03/08/2012  1:08 PM  Tunneling (cm) 0.4 @ 5 O'clock 02/13/2012  1:18 PM  Undermining (cm) 0.4 (was 0.4) 03/08/2012  1:08 PM  Margins Attached edges (approximated) 02/27/2012 11:54 AM  Drainage Amount Minimal 03/12/2012  5:07 PM  Drainage Description Serosanguineous;Purulent;Odor 03/12/2012  5:07 PM  Treatment Cleansed;Debridement (Selective);Hydrotherapy (Pulse lavage) 03/12/2012  5:07 PM  Dressing Type Island dressing;Silver hydrofiber 03/12/2012  5:07 PM  Dressing Changed;Clean;Dry;Intact 03/12/2012  5:07 PM     Wound 10/02/11 Buttocks Posterior Excoriation to sacral/coccyx area of buttocks. Blanchable. (Active)     Wound 10/02/11 Abrasion(s) Penis Other (Comment) Abrasion noted to bottom of meatus of penis. Appears scabbed over. (Active)   Hydrotherapy Pulsed lavage therapy - wound location: To R sacral wound  Pulsed Lavage with  Suction (psi): 5 psi Pulsed Lavage with Suction - Normal Saline Used: 1000 mL Pulsed Lavage Tip: Tip with splash shield Selective Debridement Selective Debridement - Location: to R sacral wound  Selective Debridement - Tools Used: Forceps;Scalpel Selective Debridement - Tissue Removed: slough   Physical Therapy Assessment and Plan Wound Therapy - Assess/Plan/Recommendations Wound Therapy - Clinical Statement: Odor not as strong once bandage removed and would cleansed. Pt tolerates debridement well. Wound dressed with silver hydrofiber 4x4's and anchored with medipore tape.  Wound Plan: Continue per PT POC. May begin laser or e-stim per PT.    Problem List Patient Active Problem List  Diagnosis  . Chronic diarrhea  . Hx of adenomatous colonic polyps  . Anemia  . C. difficile diarrhea  . Hypokalemia  . Dehydration  . PNA (pneumonia) probable  . Junctional rhythm  . Multiple sclerosis  . UTI (lower urinary tract infection)  . HTN (hypertension), malignant  . Chronic indwelling foley catheter  . Pulmonary fibrosis  . Dysphagia  . Hypernatremia  . Tremors of nervous system  . Hypothyroidism  . Pulmonary nodule    Rachelle Hora, PTA 03/12/2012, 5:13 PM

## 2012-03-15 DIAGNOSIS — D649 Anemia, unspecified: Secondary | ICD-10-CM | POA: Diagnosis not present

## 2012-03-15 DIAGNOSIS — Z466 Encounter for fitting and adjustment of urinary device: Secondary | ICD-10-CM | POA: Diagnosis not present

## 2012-03-15 DIAGNOSIS — L89309 Pressure ulcer of unspecified buttock, unspecified stage: Secondary | ICD-10-CM | POA: Diagnosis not present

## 2012-03-15 DIAGNOSIS — G35 Multiple sclerosis: Secondary | ICD-10-CM | POA: Diagnosis not present

## 2012-03-15 DIAGNOSIS — L8995 Pressure ulcer of unspecified site, unstageable: Secondary | ICD-10-CM | POA: Diagnosis not present

## 2012-03-15 DIAGNOSIS — R1310 Dysphagia, unspecified: Secondary | ICD-10-CM | POA: Diagnosis not present

## 2012-03-16 ENCOUNTER — Ambulatory Visit (HOSPITAL_COMMUNITY): Payer: Medicare Other | Admitting: *Deleted

## 2012-03-17 ENCOUNTER — Ambulatory Visit (HOSPITAL_COMMUNITY)
Admission: RE | Admit: 2012-03-17 | Discharge: 2012-03-17 | Disposition: A | Discharge status: Home / Self Care | Payer: Medicare Other | Source: Ambulatory Visit | Attending: Physician Assistant | Admitting: Physician Assistant

## 2012-03-17 DIAGNOSIS — IMO0001 Reserved for inherently not codable concepts without codable children: Secondary | ICD-10-CM | POA: Diagnosis not present

## 2012-03-17 DIAGNOSIS — L8993 Pressure ulcer of unspecified site, stage 3: Secondary | ICD-10-CM | POA: Diagnosis not present

## 2012-03-17 DIAGNOSIS — L89109 Pressure ulcer of unspecified part of back, unspecified stage: Secondary | ICD-10-CM | POA: Diagnosis not present

## 2012-03-17 NOTE — Progress Notes (Signed)
Physical Therapy - Wound Therapy  Treatment   Patient Details  Name: AHNAF GORMAN MRN: [id number] Date of Birth: Dec 31, 1940  Today's Date: 03/17/2012 Time: [id number] Time Calculation (min): 22 min  Visit#: 10  of 12   Re-eval: 04/07/12 Charges: Selective debridement (= or < 20 cm)   Subjective Subjective Assessment Subjective: My pain is not too bad today.  Pain Assessment Pain Assessment Pain Score:   2 Pain Location: Buttocks Pain Orientation: Right  Wound Therapy Pressure Ulcer 02/13/12 Stage III -  Full thickness tissue loss. Subcutaneous fat may be visible but bone, tendon or muscle are NOT exposed. Slough and Granulation tissue present (Active)  State of Healing Early/partial granulation 03/05/2012 12:00 PM  Site / Wound Assessment Clean;Granulation tissue;Yellow 03/17/2012 10:50 AM  % Wound base Red or Granulating 70% 03/17/2012 10:50 AM  % Wound base Yellow 30% 03/17/2012 10:50 AM  Peri-wound Assessment Erythema (blanchable) 02/25/2012 10:00 AM  Wound Length (cm) 3.6 cm 03/08/2012  1:08 PM  Wound Width (cm) 3 cm 03/08/2012  1:08 PM  Wound Depth (cm) 0.5 cm 03/08/2012  1:08 PM  Tunneling (cm) 0.4 @ 5 O'clock 02/13/2012  1:18 PM  Undermining (cm) 0.4 (was 0.4) 03/08/2012  1:08 PM  Margins Attached edges (approximated) 02/27/2012 11:54 AM  Drainage Amount Minimal 03/17/2012 10:50 AM  Drainage Description Serosanguineous;Purulent;Odor 03/17/2012 10:50 AM  Treatment Cleansed;Debridement (Selective);Hydrotherapy (Pulse lavage) 03/17/2012 10:50 AM  Dressing Type Island dressing;Silver hydrofiber 03/17/2012 10:50 AM  Dressing Changed;Clean;Dry;Intact 03/17/2012 10:50 AM     Wound 10/02/11 Buttocks Posterior Excoriation to sacral/coccyx area of buttocks. Blanchable. (Active)     Wound 10/02/11 Abrasion(s) Penis Other (Comment) Abrasion noted to bottom of meatus of penis. Appears scabbed over. (Active)   Hydrotherapy Pulsed lavage therapy - wound location: To R sacral wound  Pulsed  Lavage with Suction (psi): 5 psi Pulsed Lavage with Suction - Normal Saline Used: 1000 mL Pulsed Lavage Tip: Tip with splash shield Selective Debridement Selective Debridement - Location: R sacral wound  Selective Debridement - Tools Used: Forceps;Scalpel Selective Debridement - Tissue Removed: slough   Physical Therapy Assessment and Plan Wound Therapy - Assess/Plan/Recommendations Wound Therapy - Clinical Statement: Odor is not present in wound, only in drainage on bandage. Undermine appears to have decreased since last visit. Slough is slightly increased this session and very adherent. Wound dressed with silver hydro fiber 4x4's and anchored with medipore tape. Wound Plan: Continue to progress per PT per PT POC.      Problem List Patient Active Problem List  Diagnosis  . Chronic diarrhea  . Hx of adenomatous colonic polyps  . Anemia  . C. difficile diarrhea  . Hypokalemia  . Dehydration  . PNA (pneumonia) probable  . Junctional rhythm  . Multiple sclerosis  . UTI (lower urinary tract infection)  . HTN (hypertension), malignant  . Chronic indwelling foley catheter  . Pulmonary fibrosis  . Dysphagia  . Hypernatremia  . Tremors of nervous system  . Hypothyroidism  . Pulmonary nodule    Rachelle Hora, PTA 03/17/2012, 11:02 AM

## 2012-03-22 ENCOUNTER — Ambulatory Visit (HOSPITAL_COMMUNITY)
Admission: RE | Admit: 2012-03-22 | Discharge: 2012-03-22 | Disposition: A | Discharge status: Home / Self Care | Payer: Medicare Other | Source: Ambulatory Visit | Attending: Physician Assistant | Admitting: Physician Assistant

## 2012-03-22 DIAGNOSIS — L89109 Pressure ulcer of unspecified part of back, unspecified stage: Secondary | ICD-10-CM | POA: Diagnosis not present

## 2012-03-22 DIAGNOSIS — IMO0001 Reserved for inherently not codable concepts without codable children: Secondary | ICD-10-CM | POA: Diagnosis not present

## 2012-03-22 DIAGNOSIS — L8993 Pressure ulcer of unspecified site, stage 3: Secondary | ICD-10-CM | POA: Diagnosis not present

## 2012-03-22 NOTE — Progress Notes (Signed)
Physical Therapy - Wound Therapy  Treatment   Patient Details  Name: Jordan Ward MRN: [id number] Date of Birth: 11/05/1940  Today's Date: 03/22/2012 Time: [id number] Time Calculation (min): 27 min  Visit#: 11  of 16   Re-eval: 04/07/12 Charges:  Suzi Roots <20cm  Subjective Subjective Assessment Subjective: Pt. states his wife is having tests this week and may not be able to make his Friday appt.  Currently without pain.  Pain Assessment Pain Assessment Pain Assessment: No/denies pain  Wound Therapy Pressure Ulcer 02/13/12 Stage III -  Full thickness tissue loss. Subcutaneous fat may be visible but bone, tendon or muscle are NOT exposed. Slough and Granulation tissue present (Active)  State of Healing Early/partial granulation 03/05/2012 12:00 PM  Site / Wound Assessment Clean;Granulation tissue 03/22/2012 10:00 AM  % Wound base Red or Granulating 75% 03/22/2012 10:00 AM  % Wound base Yellow 25% 03/22/2012 10:00 AM  Peri-wound Assessment Erythema (blanchable) 02/25/2012 10:00 AM  Wound Length (cm) 3.6 cm 03/08/2012  1:08 PM  Wound Width (cm) 3 cm 03/08/2012  1:08 PM  Wound Depth (cm) 0.5 cm 03/08/2012  1:08 PM  Tunneling (cm) 0.4 @ 5 O'clock 02/13/2012  1:18 PM  Undermining (cm) perimeter of wound is undermined approx 0.2cm 03/22/2012 10:00 AM  Margins Attached edges (approximated) 02/27/2012 11:54 AM  Drainage Amount Minimal 03/22/2012 10:00 AM  Drainage Description Serosanguineous;Purulent;Odor 03/22/2012 10:00 AM  Treatment Hydrotherapy (Pulse lavage);Debridement (Selective) 03/22/2012 10:00 AM  Dressing Type Island dressing;Silver hydrofiber 03/22/2012 10:00 AM  Dressing Changed;Clean;Dry;Intact 03/22/2012 10:00 AM     Wound 10/02/11 Buttocks Posterior Excoriation to sacral/coccyx area of buttocks. Blanchable. (Active)     Wound 10/02/11 Abrasion(s) Penis Other (Comment) Abrasion noted to bottom of meatus of penis. Appears scabbed over. (Active)   Hydrotherapy Pulsed lavage therapy - wound  location: To R sacral wound  Pulsed Lavage with Suction (psi): 5 psi Pulsed Lavage with Suction - Normal Saline Used: 1000 mL Pulsed Lavage Tip: Tip with splash shield Selective Debridement Selective Debridement - Location: R sacral wound  Selective Debridement - Tools Used: Forceps;Scalpel Selective Debridement - Tissue Removed: slough   Physical Therapy Assessment and Plan Wound Therapy - Assess/Plan/Recommendations Wound Therapy - Clinical Statement: Odor is present but believed just from dressings that have been in place X 5 days.  wound is undermined entire perimeter.  Removed slough from outer edge. Wound Plan: Continue PL, debridement and packing with silver hydrofiber.      Problem List Patient Active Problem List  Diagnosis  . Chronic diarrhea  . Hx of adenomatous colonic polyps  . Anemia  . C. difficile diarrhea  . Hypokalemia  . Dehydration  . PNA (pneumonia) probable  . Junctional rhythm  . Multiple sclerosis  . UTI (lower urinary tract infection)  . HTN (hypertension), malignant  . Chronic indwelling foley catheter  . Pulmonary fibrosis  . Dysphagia  . Hypernatremia  . Tremors of nervous system  . Hypothyroidism  . Pulmonary nodule    GP No functional reporting required  Teena Irani, PTA/CLT 03/22/2012, 10:59 AM

## 2012-03-24 ENCOUNTER — Ambulatory Visit (HOSPITAL_COMMUNITY)
Admission: RE | Admit: 2012-03-24 | Discharge: 2012-03-24 | Disposition: A | Discharge status: Home / Self Care | Payer: Medicare Other | Source: Ambulatory Visit | Attending: Physician Assistant | Admitting: Physician Assistant

## 2012-03-24 DIAGNOSIS — IMO0001 Reserved for inherently not codable concepts without codable children: Secondary | ICD-10-CM | POA: Diagnosis not present

## 2012-03-24 DIAGNOSIS — L89109 Pressure ulcer of unspecified part of back, unspecified stage: Secondary | ICD-10-CM | POA: Diagnosis not present

## 2012-03-24 DIAGNOSIS — L8993 Pressure ulcer of unspecified site, stage 3: Secondary | ICD-10-CM | POA: Diagnosis not present

## 2012-03-24 NOTE — Progress Notes (Signed)
Physical Therapy - Wound Therapy  Treatment   Patient Details  Name: DELANE CREHAN MRN: [id number] Date of Birth: Sep 27, 1941  Today's Date: 03/24/2012 Time: [id number] Time Calculation (min): 32 min  Visit#: 12  of 16   Re-eval: 04/07/12  Subjective Subjective Assessment Subjective: Pt states that since last Thursday he has had increased drainage.  Pt is on antibiotic. Date of Onset: 11/28/11  Pain Assessment Pain Assessment Pain Assessment: No/denies pain  Wound Therapy Pressure Ulcer 02/13/12 Stage III -  Full thickness tissue loss. Subcutaneous fat may be visible but bone, tendon or muscle are NOT exposed. Slough and Granulation tissue present (Active)  State of Healing Early/partial granulation 03/24/2012  9:12 AM  Site / Wound Assessment Clean;Granulation tissue 03/24/2012  9:12 AM  % Wound base Red or Granulating 80% 03/24/2012  9:12 AM  % Wound base Yellow 20% 03/24/2012  9:12 AM  Peri-wound Assessment Erythema (blanchable) 02/25/2012 10:00 AM  Wound Length (cm) 3.6 cm 03/08/2012  1:08 PM  Wound Width (cm) 3 cm 03/08/2012  1:08 PM  Wound Depth (cm) 0.5 cm 03/08/2012  1:08 PM  Tunneling (cm) 0.4 @ 5 O'clock 02/13/2012  1:18 PM  Undermining (cm) perimeter of wound is undermined approx 0.2cm 03/22/2012 10:00 AM  Margins Attached edges (approximated) 02/27/2012 11:54 AM  Drainage Amount Minimal 03/24/2012  9:12 AM  Drainage Description Serosanguineous;No odor 03/24/2012  9:12 AM  Treatment Debridement (Selective);Hydrotherapy (Pulse lavage) 03/24/2012  9:12 AM  Dressing Type Silver hydrofiber 03/24/2012  9:12 AM  Dressing Changed;Clean 03/24/2012  9:12 AM     Wound 10/02/11 Buttocks Posterior Excoriation to sacral/coccyx area of buttocks. Blanchable. (Active)     Wound 10/02/11 Abrasion(s) Penis Other (Comment) Abrasion noted to bottom of meatus of penis. Appears scabbed over. (Active)   Hydrotherapy Pulsed lavage therapy - wound location: R sacral wound Pulsed Lavage with Suction  (psi): 5 psi Pulsed Lavage with Suction - Normal Saline Used: 1000 mL Selective Debridement Selective Debridement - Location: R sacral Selective Debridement - Tools Used: Forceps;Scalpel Selective Debridement - Tissue Removed: slough; and skin that had attatched to Plucinski of wound from 4-6 o'clock    Physical Therapy Assessment and Plan Wound Therapy - Assess/Plan/Recommendations Wound Therapy - Clinical Statement: Pt wound has undermining from 8 0'clock to 4 o'clock Q-tip used to clean undermining area.  From 4-6 wound had sealed off needing debridement to ensure wound fills in then heals. Factors Delaying/Impairing Wound Healing: Multiple medical problems;Altered sensation;Polypharmacy Hydrotherapy Plan: Debridement;Dressing change;Patient/family education;Pulsatile lavage with suction (Pt encouraged to eat more protein in diet to aid healing) Wound Therapy - Frequency: 3X / week Wound Therapy - Current Recommendations: PT Wound Plan: Continute with POC      Goals    Problem List Patient Active Problem List  Diagnosis  . Chronic diarrhea  . Hx of adenomatous colonic polyps  . Anemia  . C. difficile diarrhea  . Hypokalemia  . Dehydration  . PNA (pneumonia) probable  . Junctional rhythm  . Multiple sclerosis  . UTI (lower urinary tract infection)  . HTN (hypertension), malignant  . Chronic indwelling foley catheter  . Pulmonary fibrosis  . Dysphagia  . Hypernatremia  . Tremors of nervous system  . Hypothyroidism  . Pulmonary nodule    GP    Jennae Hakeem,CINDY 03/24/2012, 9:22 AM

## 2012-03-25 ENCOUNTER — Ambulatory Visit (HOSPITAL_COMMUNITY): Payer: Medicare Other | Admitting: *Deleted

## 2012-03-26 ENCOUNTER — Ambulatory Visit (INDEPENDENT_AMBULATORY_CARE_PROVIDER_SITE_OTHER): Payer: Medicare Other | Admitting: Urology

## 2012-03-26 ENCOUNTER — Ambulatory Visit (HOSPITAL_COMMUNITY): Payer: Medicare Other | Admitting: Physical Therapy

## 2012-03-26 DIAGNOSIS — N319 Neuromuscular dysfunction of bladder, unspecified: Secondary | ICD-10-CM

## 2012-03-29 ENCOUNTER — Ambulatory Visit (HOSPITAL_COMMUNITY)
Admission: RE | Admit: 2012-03-29 | Discharge: 2012-03-29 | Disposition: A | Discharge status: Home / Self Care | Payer: Medicare Other | Source: Ambulatory Visit | Attending: Family Medicine | Admitting: Family Medicine

## 2012-03-29 DIAGNOSIS — L8993 Pressure ulcer of unspecified site, stage 3: Secondary | ICD-10-CM | POA: Insufficient documentation

## 2012-03-29 DIAGNOSIS — IMO0001 Reserved for inherently not codable concepts without codable children: Secondary | ICD-10-CM | POA: Diagnosis not present

## 2012-03-29 DIAGNOSIS — L89109 Pressure ulcer of unspecified part of back, unspecified stage: Secondary | ICD-10-CM | POA: Insufficient documentation

## 2012-03-29 NOTE — Progress Notes (Signed)
Physical Therapy - Wound Therapy  Treatment   Patient Details  Name: Jordan Ward MRN: [id number] Date of Birth: 18-Dec-1940  Today's Date: 03/29/2012 Time: [id number] Time Calculation (min): 30 min  Visit#: 13  of 16   Re-eval: 04/07/12 Charges: Selective debridement (= or < 20 cm)   Subjective Subjective Assessment Subjective: The packing fell out yesterday.  Pain Assessment Pain Assessment Pain Score:   9 Pain Location: Buttocks Pain Orientation: Right  Wound Therapy Pressure Ulcer 02/13/12 Stage III -  Full thickness tissue loss. Subcutaneous fat may be visible but bone, tendon or muscle are NOT exposed. Slough and Granulation tissue present (Active)  State of Healing Early/partial granulation 03/29/2012 12:57 PM  Site / Wound Assessment Clean;Granulation tissue 03/29/2012 12:57 PM  % Wound base Red or Granulating 80% 03/29/2012 12:57 PM  % Wound base Yellow 20% 03/29/2012 12:57 PM  Peri-wound Assessment Erythema (blanchable) 02/25/2012 10:00 AM  Wound Length (cm) 3.6 cm 03/08/2012  1:08 PM  Wound Width (cm) 3 cm 03/08/2012  1:08 PM  Wound Depth (cm) 0.5 cm 03/08/2012  1:08 PM  Tunneling (cm) 0.4 @ 5 O'clock 02/13/2012  1:18 PM  Undermining (cm) perimeter of wound is undermined approx 0.2cm 03/22/2012 10:00 AM  Margins Attached edges (approximated) 02/27/2012 11:54 AM  Drainage Amount Minimal 03/29/2012 12:57 PM  Drainage Description Serosanguineous;Odor 03/29/2012 12:57 PM  Treatment Debridement (Selective);Hydrotherapy (Pulse lavage) 03/29/2012 12:57 PM  Dressing Type Silver hydrofiber 03/29/2012 12:57 PM  Dressing Changed;Clean 03/29/2012 12:57 PM     Wound 10/02/11 Buttocks Posterior Excoriation to sacral/coccyx area of buttocks. Blanchable. (Active)     Wound 10/02/11 Abrasion(s) Penis Other (Comment) Abrasion noted to bottom of meatus of penis. Appears scabbed over. (Active)   Hydrotherapy Pulsed lavage therapy - wound location: R sacral wound Pulsed Lavage with Suction (psi): 5  psi Pulsed Lavage with Suction - Normal Saline Used: 1000 mL Pulsed Lavage Tip: Tip with splash shield Selective Debridement Selective Debridement - Location: R sacral Selective Debridement - Tools Used: Forceps;Scalpel Selective Debridement - Tissue Removed: Slough   Physical Therapy Assessment and Plan Wound Therapy - Assess/Plan/Recommendations Wound Therapy - Clinical Statement: Wound presents with mild odor this session. Pt's pain is elevated. Pt advised to call MD to receive anti-biotic. Pt tolerates debridement well but slough is very adherent. Pt reports no change in pain at end of session. Wound Plan: Continue with wound care per PT POC.       Problem List Patient Active Problem List  Diagnosis  . Chronic diarrhea  . Hx of adenomatous colonic polyps  . Anemia  . C. difficile diarrhea  . Hypokalemia  . Dehydration  . PNA (pneumonia) probable  . Junctional rhythm  . Multiple sclerosis  . UTI (lower urinary tract infection)  . HTN (hypertension), malignant  . Chronic indwelling foley catheter  . Pulmonary fibrosis  . Dysphagia  . Hypernatremia  . Tremors of nervous system  . Hypothyroidism  . Pulmonary nodule    Rachelle Hora, PTA 03/29/2012, 2:52 PM

## 2012-03-30 ENCOUNTER — Other Ambulatory Visit (HOSPITAL_COMMUNITY): Payer: Medicare Other

## 2012-03-31 ENCOUNTER — Ambulatory Visit (HOSPITAL_COMMUNITY)
Admission: RE | Admit: 2012-03-31 | Discharge: 2012-03-31 | Disposition: A | Discharge status: Home / Self Care | Payer: Medicare Other | Source: Ambulatory Visit | Attending: Physician Assistant | Admitting: Physician Assistant

## 2012-03-31 NOTE — Progress Notes (Signed)
Physical Therapy - Wound Therapy  Treatment   Patient Details  Name: Jordan Ward MRN: [id number] Date of Birth: [birth year]  Today's Date: 03/31/2012 Time: [id number] Time Calculation (min): 20 min  Visit#: 14  of 16   Re-eval: 04/07/12 Charges: Selective debridement (= or < 20 cm)    Subjective Subjective Assessment Subjective: My pain is much lower today than it was Monday. I called MD and got an antibiotic.  Pain Assessment Pain Assessment Pain Score:   2 Pain Location: Buttocks Pain Orientation: Right  Wound Therapy Pressure Ulcer 02/13/12 Stage III -  Full thickness tissue loss. Subcutaneous fat may be visible but bone, tendon or muscle are NOT exposed. Slough and Granulation tissue present (Active)  State of Healing Early/partial granulation 03/31/2012 12:23 PM  Site / Wound Assessment Clean;Granulation tissue 03/31/2012 12:23 PM  % Wound base Red or Granulating 80% 03/31/2012 12:23 PM  % Wound base Yellow 20% 03/31/2012 12:23 PM  Peri-wound Assessment Erythema (blanchable) 02/25/2012 10:00 AM  Wound Length (cm) 3.6 cm 03/08/2012  1:08 PM  Wound Width (cm) 3 cm 03/08/2012  1:08 PM  Wound Depth (cm) 0.5 cm 03/08/2012  1:08 PM  Tunneling (cm) 0.4 @ 5 O'clock 02/13/2012  1:18 PM  Undermining (cm) perimeter of wound is undermined approx 0.2cm 03/22/2012 10:00 AM  Margins Attached edges (approximated) 02/27/2012 11:54 AM  Drainage Amount Moderate 03/31/2012 12:23 PM  Drainage Description Serosanguineous;Odor 03/31/2012 12:23 PM  Treatment Cleansed;Debridement (Selective) 03/31/2012 12:23 PM  Dressing Type Silver hydrofiber 03/31/2012 12:23 PM  Dressing Changed;Clean 03/31/2012 12:23 PM     Wound 10/02/11 Buttocks Posterior Excoriation to sacral/coccyx area of buttocks. Blanchable. (Active)     Wound 10/02/11 Abrasion(s) Penis Other (Comment) Abrasion noted to bottom of meatus of penis. Appears scabbed over. (Active)   Selective Debridement Selective Debridement - Location: R sacral Selective  Debridement - Tools Used: Forceps;Scalpel Selective Debridement - Tissue Removed: Slough   Physical Therapy Assessment and Plan Wound Therapy - Assess/Plan/Recommendations Wound Therapy - Clinical Statement: Pt presents with decreased pain deceased signs of infection this session. PL held not completed after consulting Geoffery Lyons, PT. Wound responds well to cleansing and selective debridement and does not need PL. Will watch wound and assess need to begin PL again. Pt reports no change in pain at end of session. Slough continues to be adherent and difficult to remove. Wound Plan: Continue with wound care per PT POC.       Problem List Patient Active Problem List  Diagnosis  . Chronic diarrhea  . Hx of adenomatous colonic polyps  . Anemia  . C. difficile diarrhea  . Hypokalemia  . Dehydration  . PNA (pneumonia) probable  . Junctional rhythm  . Multiple sclerosis  . UTI (lower urinary tract infection)  . HTN (hypertension), malignant  . Chronic indwelling foley catheter  . Pulmonary fibrosis  . Dysphagia  . Hypernatremia  . Tremors of nervous system  . Hypothyroidism  . Pulmonary nodule    Rachelle Hora, PTA 03/31/2012, 12:27 PM

## 2012-04-02 ENCOUNTER — Ambulatory Visit (HOSPITAL_COMMUNITY)
Admission: RE | Admit: 2012-04-02 | Discharge: 2012-04-02 | Disposition: A | Discharge status: Home / Self Care | Payer: Medicare Other | Source: Ambulatory Visit | Attending: Physician Assistant | Admitting: Physician Assistant

## 2012-04-02 NOTE — Progress Notes (Signed)
Physical Therapy - Wound Therapy  Re-Evaluation   Patient Details  Name: Jordan Ward MRN: [id number] Date of Birth: 1941-04-24  Today's Date: 04/02/2012 Time: [id number] Time Calculation (min): 40 min Charges: Deb < 20 cm, Self Care 10' Visit#: 15  of 25   Re-eval: 05/02/12  Subjective Subjective Assessment Subjective: My wife is going to be out of town for about 4 days and my daughter is going to come over at night time.  he will also be going in for his pre-op soon and may not be able to continue with OP PT services for wound care.  I am not going to be able to change my dressing.  Pt reports he is still on his antibiotic.   Pain Assessment Pain Assessment Pain Assessment: No/denies pain (more painful at night time, no pain currently)  Wound Therapy Pressure Ulcer 02/13/12 Stage III -  Full thickness tissue loss. Subcutaneous fat may be visible but bone, tendon or muscle are NOT exposed. Slough and Granulation tissue present (Active)  State of Healing Early/partial granulation 04/02/2012  1:47 PM  Site / Wound Assessment Clean;Granulation tissue 04/02/2012  1:47 PM  % Wound base Red or Granulating 80% 04/02/2012  1:47 PM  % Wound base Yellow 20%  04/02/2012  1:47 PM  Peri-wound Assessment Erythema (blanchable) 02/25/2012 10:00 AM  Wound Length (cm) 3 cm (was 3.6) 04/02/2012  1:47 PM  Wound Width (cm) 2.5 cm (was 3.0) 04/02/2012  1:47 PM  Wound Depth (cm) 1.5 cm (was 0.5) 04/02/2012  1:47 PM  Tunneling (cm) 0.4 @ 5 O'clock 02/13/2012  1:18 PM  Undermining (cm) perimter of wound undermined at 0.4 cm (was 0.4 cm) 04/02/2012  1:47 PM  Margins Attached edges (approximated) 04/02/2012  1:47 PM  Drainage Amount Moderate 04/02/2012  1:47 PM  Drainage Description Serosanguineous 04/02/2012  1:47 PM  Treatment Cleansed;Debridement (Selective);Hydrotherapy (Pulse lavage) 04/02/2012  1:47 PM  Dressing Type Silver hydrofiber 04/02/2012  1:47 PM  Dressing Changed;Clean 04/02/2012  1:47 PM     Wound 10/02/11 Buttocks  Posterior Excoriation to sacral/coccyx area of buttocks. Blanchable. (Active)     Wound 10/02/11 Abrasion(s) Penis Other (Comment) Abrasion noted to bottom of meatus of penis. Appears scabbed over. (Active)   Hydrotherapy Pulsed lavage therapy - wound location: R sacral wound  Pulsed Lavage with Suction (psi): 5 psi Pulsed Lavage with Suction - Normal Saline Used: 1000 mL Pulsed Lavage Tip: Tip with splash shield (after debridment) Selective Debridement Selective Debridement - Location: R sacral Selective Debridement - Tools Used: Forceps;Scalpel;Scissors Selective Debridement - Tissue Removed: Slough   Physical Therapy Assessment and Plan Wound Therapy - Assess/Plan/Recommendations Wound Therapy - Clinical Statement: G-code updated, re-eval complete.  Mr. Bleau has attended 15 OP PT visits to address his R sacral wound.  he reports that he and his wife are unable to perform self care and he plans to go in for his pre-op soon to take care of other medical problems.  He continues to decrease his length and width.  Undermining remains the same and depth has increased only the the very middle of wound (approximatly 0.2x0.2cm in diameter) typically the odor had been steming from that sloughy location.  It continues to improve overall granulate and decrease slough, but sloght remains adherent.  Discussed with pt possiblilty of mainitaining dressing changes at home and to check in with [place] 1x/month for debridment.  Pt is worried that due to his health conditions and his wifes general decline that she will not be  able to handle dressing changes and would prefer for [facility] nursing to handle dressing changes.  Explained to pt that we can discuss this with his wife over the next few visits.  Will continue to see patient as he is making slow, but steady progress towards his goals.  Factors Delaying/Impairing Wound Healing: Altered sensation;Multiple medical problems;Polypharmacy Hydrotherapy Plan:  Debridement;Dressing change;Electrical stimulation;Pulsatile lavage with suction;Patient/family education Wound Therapy - Frequency: 3X / week Wound Plan: Continue with wound care and PSLV at end of treatment if necessary.  consider laser therapy if able.       Goals Wound Therapy Goals - Improve the function of patient's integumentary system by progressing the wound(s) through the phases of wound healing by: Decrease Necrotic Tissue to: 0% Decrease Necrotic Tissue - Progress: Progressing toward goal Increase Granulation Tissue to: 100% Increase Granulation Tissue - Progress: Progressing toward goal Decrease Length/Width/Depth by (cm): 1x1x0.1x0.1 Decrease Length/Width/Depth - Progress: Progressing toward goal Improve Drainage Characteristics: Min Improve Drainage Characteristics - Progress: Progressing toward goal Patient/Family will be able to : perform independent wound care Patient/Family Instruction Goal - Progress: Not met Goals/treatment plan/discharge plan were made with and agreed upon by patient/family: Yes (and wife Joann) Time For Goal Achievement: Other (comment) (12 weeks) Wound Therapy - Potential for Goals: Fair (secondary to transportation issues)  Problem List Patient Active Problem List  Diagnosis  . Chronic diarrhea  . Hx of adenomatous colonic polyps  . Anemia  . C. difficile diarrhea  . Hypokalemia  . Dehydration  . PNA (pneumonia) probable  . Junctional rhythm  . Multiple sclerosis  . UTI (lower urinary tract infection)  . HTN (hypertension), malignant  . Chronic indwelling foley catheter  . Pulmonary fibrosis  . Dysphagia  . Hypernatremia  . Tremors of nervous system  . Hypothyroidism  . Pulmonary nodule    GP Functional Limitation: Other PT subsequent  Yzabella Crunk 04/02/2012, 3:47 PM

## 2012-04-05 ENCOUNTER — Ambulatory Visit (HOSPITAL_COMMUNITY)
Admission: RE | Admit: 2012-04-05 | Discharge: 2012-04-05 | Disposition: A | Discharge status: Home / Self Care | Payer: Medicare Other | Source: Ambulatory Visit | Attending: Physician Assistant | Admitting: Physician Assistant

## 2012-04-05 NOTE — Progress Notes (Signed)
Physical Therapy - Wound Therapy  Treatment   Patient Details  Name: BOWAN FOWLE MRN: [id number] Date of Birth: 08/31/1941  Today's Date: 04/05/2012 Time: [id number] Time Calculation (min): 30 min  Visit#: 16  of 25   Re-eval: 05/02/12  Subjective Subjective Assessment Subjective: Discussed with patient and his wife about wife continuing with home care and continuing with a standing order for 1x/month for debridment and hydrotherapy as a sacral wound is likely to take a long time to heal.  Pt and wife are agreeable to this due to the fact that it is difficult for him to come into therapy this often with his declining health and the health of his wife.   Pain Assessment Pain Assessment Pain Assessment: No/denies pain  Wound Therapy Pressure Ulcer 02/13/12 Stage III -  Full thickness tissue loss. Subcutaneous fat may be visible but bone, tendon or muscle are NOT exposed. Slough and Granulation tissue present (Active)  State of Healing Early/partial granulation 04/05/2012  4:10 PM  Site / Wound Assessment Clean;Granulation tissue 04/05/2012  4:10 PM  % Wound base Red or Granulating 80% 04/05/2012  4:10 PM  % Wound base Yellow 20% 04/05/2012  4:10 PM  Peri-wound Assessment Erythema (blanchable) 02/25/2012 10:00 AM  Wound Length (cm) 3 cm 04/02/2012  1:47 PM  Wound Width (cm) 2.5 cm 04/02/2012  1:47 PM  Wound Depth (cm) 1.5 cm 04/02/2012  1:47 PM  Tunneling (cm) 0.4 @ 5 O'clock 02/13/2012  1:18 PM  Undermining (cm) perimter of wound undermined at 0.4 cm (was 0.4 cm) 04/02/2012  1:47 PM  Margins Attached edges (approximated) 04/05/2012  4:10 PM  Drainage Amount Moderate 04/05/2012  4:10 PM  Drainage Description Serosanguineous 04/05/2012  4:10 PM  Treatment Cleansed;Debridement (Selective) 04/05/2012  4:10 PM  Dressing Type Silver hydrofiber 04/05/2012  4:10 PM  Dressing Changed;Clean 04/05/2012  4:10 PM     Wound 10/02/11 Buttocks Posterior Excoriation to sacral/coccyx area of buttocks. Blanchable. (Active)    Wound 10/02/11 Abrasion(s) Penis Other (Comment) Abrasion noted to bottom of meatus of penis. Appears scabbed over. (Active)   Hydrotherapy Pulsed lavage therapy - wound location: Not needed Pulsed Lavage Tip:  (after debridment) Selective Debridement Selective Debridement - Location: R sacral Selective Debridement - Tools Used: Forceps;Scalpel Selective Debridement - Tissue Removed: Slough  Debridement and Dressing change and dressing education by PTA (RS) Physical Therapy Assessment and Plan Wound Therapy - Assess/Plan/Recommendations Wound Therapy - Clinical Statement: Mr. Hirani will benefit from skilled OP PT to address wound care 1x/month until wound heals.  Wife will provide dressing changes for now.  Will continue to provide skilled care when needed.  If pt wound is healing well without skilled care in 1 month will consider D/C patient with home care.  PTA educated wife on proper dressing technquies.  Will order supplies from [facility] for home delievery.  Wound Therapy - Frequency: Other (comment) (1x/month x6 months. ) Wound Plan: Continue with skilled wound care and re-assess next visit.       Goals    Problem List Patient Active Problem List  Diagnosis  . Chronic diarrhea  . Hx of adenomatous colonic polyps  . Anemia  . C. difficile diarrhea  . Hypokalemia  . Dehydration  . PNA (pneumonia) probable  . Junctional rhythm  . Multiple sclerosis  . UTI (lower urinary tract infection)  . HTN (hypertension), malignant  . Chronic indwelling foley catheter  . Pulmonary fibrosis  . Dysphagia  . Hypernatremia  . Tremors of nervous  system  . Hypothyroidism  . Pulmonary nodule    GP    Malaijah Houchen 04/05/2012, 6:31 PM

## 2012-04-07 ENCOUNTER — Ambulatory Visit (HOSPITAL_COMMUNITY): Payer: Medicare Other | Admitting: Physical Therapy

## 2012-04-09 ENCOUNTER — Ambulatory Visit (HOSPITAL_COMMUNITY): Payer: Medicare Other | Admitting: Physical Therapy

## 2012-04-12 ENCOUNTER — Encounter (HOSPITAL_COMMUNITY): Payer: Self-pay | Admitting: Pharmacy Technician

## 2012-04-12 ENCOUNTER — Ambulatory Visit (HOSPITAL_COMMUNITY): Payer: Medicare Other | Admitting: Physical Therapy

## 2012-04-20 ENCOUNTER — Encounter (HOSPITAL_COMMUNITY)
Admission: RE | Admit: 2012-04-20 | Discharge: 2012-04-20 | Disposition: A | Discharge status: Home / Self Care | Payer: Medicare Other | Source: Ambulatory Visit | Attending: Urology | Admitting: Urology

## 2012-04-20 ENCOUNTER — Encounter (HOSPITAL_COMMUNITY): Payer: Self-pay

## 2012-04-20 DIAGNOSIS — G4733 Obstructive sleep apnea (adult) (pediatric): Secondary | ICD-10-CM | POA: Diagnosis not present

## 2012-04-20 DIAGNOSIS — Z01812 Encounter for preprocedural laboratory examination: Secondary | ICD-10-CM | POA: Diagnosis not present

## 2012-04-20 DIAGNOSIS — Z79899 Other long term (current) drug therapy: Secondary | ICD-10-CM | POA: Diagnosis not present

## 2012-04-20 DIAGNOSIS — Z01810 Encounter for preprocedural cardiovascular examination: Secondary | ICD-10-CM | POA: Diagnosis not present

## 2012-04-20 DIAGNOSIS — I10 Essential (primary) hypertension: Secondary | ICD-10-CM | POA: Diagnosis not present

## 2012-04-20 DIAGNOSIS — R338 Other retention of urine: Secondary | ICD-10-CM | POA: Diagnosis not present

## 2012-04-20 HISTORY — DX: Myoneural disorder, unspecified: G70.9

## 2012-04-20 HISTORY — DX: Other chronic pain: G89.29

## 2012-04-20 HISTORY — DX: Sleep apnea, unspecified: G47.30

## 2012-04-20 HISTORY — DX: Dorsalgia, unspecified: M54.9

## 2012-04-20 HISTORY — DX: Non-pressure chronic ulcer of back with unspecified severity: L98.429

## 2012-04-20 LAB — SURGICAL PCR SCREEN: MRSA, PCR: NEGATIVE

## 2012-04-20 LAB — BASIC METABOLIC PANEL
Chloride: 98 mEq/L (ref 96–112)
GFR calc non Af Amer: 88 mL/min — ABNORMAL LOW (ref >90)
Glucose, Bld: 91 mg/dL (ref 70–99)
Potassium: 4.2 mEq/L (ref 3.5–5.1)
Sodium: 135 mEq/L (ref 135–145)

## 2012-04-20 LAB — HEMOGLOBIN AND HEMATOCRIT, BLOOD: HCT: 36.4 % — ABNORMAL LOW (ref 39.0–52.0)

## 2012-04-20 NOTE — Patient Instructions (Signed)
[place]  04/20/2012   Your procedure is scheduled on:  Tuesday, 04/27/12  Report to [facility].  Call this number if you have problems the morning of surgery: [phone]   Remember:   Do not eat food:After Midnight.  May have clear liquids:until Midnight .  Clear liquids include soda, tea, black coffee, apple or grape juice, broth.  Take these medicines the morning of surgery with A SIP OF WATER: albuterol, verapamil, levothyroxine, valium, lorcet. Please bring your inhaler with you.   Do not wear jewelry, make-up or nail polish.  Do not wear lotions, powders, or perfumes. You may wear deodorant.  Do not shave 48 hours prior to surgery. Men may shave face and neck.  Do not bring valuables to the hospital.  Contacts, dentures or bridgework may not be worn into surgery.  Leave suitcase in the car. After surgery it may be brought to your room.  For patients admitted to the hospital, checkout time is 11:00 AM the day of discharge.   Patients discharged the day of surgery will not be allowed to drive home.  Name and phone number of your driver: family  Special Instructions: CHG Shower Use Special Wash: 1/2 bottle night before surgery and 1/2 bottle morning of surgery.   Please read over the following fact sheets that you were given: Pain Booklet, MRSA Information, Surgical Site Infection Prevention, Anesthesia Post-op Instructions and Care and Recovery After Surgery  Suprapubic Catheter Replacement Care After Refer to this sheet in the next few weeks. These instructions provide you with information on caring for yourself after changing your catheter. Your caregiver may also give you more specific instructions. Call your caregiver if you have any problems or questions after you change your catheter. HOME CARE INSTRUCTIONS   Take all medicines prescribed by your caregiver. Follow the directions carefully.   Drink 8 glasses of water every day. This produces good urine flow.     Check the skin around your catheter a few times every day. Watch for redness and swelling. Look for any fluids coming out of the opening.   Do not use powder or cream around the catheter opening.   Do not take tub baths or use pools or hot tubs.   Keep all follow-up appointments.  SEEK MEDICAL CARE IF:   You leak urine.   Your skin around the catheter becomes red or sore.   Your urine flow slows down.   Your urine gets cloudy or smelly.  SEEK IMMEDIATE MEDICAL CARE IF:   You have chills, nausea, or back pain.   You have trouble changing your catheter.   Your catheter comes out.   You have blood in your urine.   You have no urine flow for 1 hour.   You have a fever.  Document Released: 06/03/2011 Document Revised: 09/04/2011 Document Reviewed: 06/03/2011 [facility] Patient Information 2012 [place].Suprapubic Catheter Replacement A suprapubic catheter is a rubber tube with a tiny balloon on the end. It is used to drain urine from the bladder. This catheter is put in your bladder through a small opening in the lower center part of your abdomen. Suprapubic refers to the area right above your pubic bone. The balloon on the end of the catheter is filled with germ-free (sterile) water. This keeps the catheter from slipping out. When the catheter is in place, your urine will drain into a collection bag. The bag can be put beside your bed at night or attached  to your leg during the day. Sometimes, a caregiver will change your suprapubic catheter. Other times, you may need to change it yourself. This may be the case if you need to wear a catheter for a long time. Usually, they need to be changed every 4 to 6 weeks. Ask your caregiver how often yours should be changed. BEFORE THE PROCEDURE Your caregiver will help you order the following supplies you will need:  Sterile gloves.   Catheters.   Syringes.   Sterile water.   Sterile cleaning solution.   Lubricant.    Drainage bags.  PROCEDURE  To change your catheter:  Drink plenty of fluids before changing the catheter.   Wash your hands with soap and water.   Lie on your back and put on sterile gloves.   Clean the skin around the catheter opening. Use the sterile cleaning solution.   Use a syringe to get the water out of the balloon from the old catheter.   Slowly remove the catheter.   Take off the first pair of gloves, and put on a new pair. Then put lubricant on the tip of the new catheter. Put the new catheter through the opening.   Wait for some urine to start flowing. Then, use the other syringe to fill the balloon with sterile water.   Attach the catheter to your drainage bag. Make sure the connection is tight.  Important warnings  The catheter should come out easily. If it seems stuck, do not pull it.   Call your caregiver right away if you have any trouble while changing the catheter.   When the old catheter is removed, the new one should be put in right away. This is because the opening will close quickly. If you have a problem, go to an emergency clinic right away.  AFTER THE PROCEDURE   Understand how to care for the catheter site and change any bandages.   Understand how to use and clean the drainage bags.  Document Released: 06/10/2001 Document Revised: 09/04/2011 Document Reviewed: 05/02/2011 [facility] Patient Information 2012 [place].Suprapubic Catheter Home Guide A suprapubic catheter is a rubber tube with a tiny balloon on the end. It is used to drain urine from the bladder. This catheter is put in your bladder through a small opening in the lower center part of your abdomen. Suprapubic refers to the area right above your pubic bone. The balloon on the end of the catheter is filled with germ-free (sterile) water. This keeps the catheter from slipping out. When the catheter is in place, your urine will drain into a collection bag. The bag can be put beside your  bed at night or attached to your leg during the day. HOW TO CARE FOR YOUR CATHETER  Cleaning your skin  Clean the skin around the catheter opening every day.   Wash your hands with soap and water.   Clean the skin around the opening with a clean washcloth and soapy water. Do not pull on the tube.   Pat the area dry with a clean towel.   Your caregiver may want you to put a bandage (dressing) over the site. Do not use ointment on this area unless your caregiver tells you to.  Cleaning the catheter  Ask your caregiver if you need to clean the catheter and how often.   Use only soap and water.   There may be crusts on the catheter. Put hydrogen peroxide on a cotton ball or gauze pad to remove  any crust.  Emptying the collection bag  You may have a large drainage bag to use at night and a smaller one for daytime. Empty the large bag every 8 hours. Empty the small bag when it is about ? full.   Keep the drainage bag below the level of the catheter. This keeps urine from flowing backwards.   Hold the bag over the toilet or another container. Release the valve (spigot) at the bottom of the bag. Do not touch the opening of the spigot. Do not let the opening touch the toilet or container.   Close the spigot tightly when the bag is empty.  Cleaning the collection bag  Clean the bag every few days.   First, wash your hands.   Disconnect the tubing from the catheter. Replace the used bag with a new bag. Then you can clean the used one.   Empty the used bag completely. Rinse it out with warm water and soap or fill the bag with water and add 1 teaspoon of vinegar. Let it sit for about 30 minutes. Then drain.   The bag should be completely dry before storing it. Put it inside a plastic bag to keep it clean.  Checking everything  Always make sure there are no kinks in the catheter or tubing.   Always make sure there are no leaks in the catheter, tubing, or collection bag.  HOW TO CHANGE  YOUR CATHETER Sometimes, a caregiver will change your suprapubic catheter. Other times, you may need to change it yourself. This may be the case if you need to wear a catheter for a long time. Usually, they need to be changed every 4 to 6 weeks. Ask your caregiver how often yours should be changed. Your caregiver will help you order the following supplies for home delivery:  Sterile gloves.   Catheters.   Syringes.   Sterile water.   Sterile cleaning solution.   Lubricant.   Drainage bags.  Changing your catheter  Drink plenty of fluids before changing the catheter.   Wash your hands with soap and water.   Lie on your back and put on sterile gloves.   Clean the skin around the catheter opening. Use the sterile cleaning solution.   Use a syringe to get the water out of the balloon from the old catheter.   Slowly remove the catheter.   Take off the first pair of gloves, and put on a new pair. Then put lubricant on the tip of the new catheter. Put the new catheter through the opening.   Wait for some urine to start flowing. Then, use the other syringe to fill the balloon with sterile water.   Attach the catheter to your drainage bag. Make sure the connection is tight.  Important warnings  The catheter should come out easily. If it seems stuck, do not pull it.   Call your caregiver right away if you have any trouble while changing the catheter.   When the old catheter is removed, the new one should be put in right away. This is because the opening will close quickly. If you have a problem, go to an emergency clinic right away.  RISKS AND COMPLICATIONS  Urine flow can become blocked. This can happen if the catheter or tubes are not working right. A blood clot can also block urine flow.   The catheter might irritate tissue in your body. This can cause bleeding.   The skin near the opening for the catheter  may become irritated or infected.   Bacteria may get into your  bladder. This can cause a urinary tract infection.  HOME CARE INSTRUCTIONS  Take all medicines prescribed by your caregiver. Follow the directions carefully.   Drink 8 glasses of water every day. This produces good urine flow.   Check the skin around your catheter a few times every day. Watch for redness and swelling. Look for any fluids coming out of the opening.   Do not use powder or cream around the catheter opening.   Do not take tub baths or use pools or hot tubs.   Keep all follow-up appointments.  SEEK MEDICAL CARE IF:  You leak urine.   Your skin around the catheter becomes red or sore.   Your urine flow slows down.   Your urine gets cloudy or smelly.  SEEK IMMEDIATE MEDICAL CARE IF:   You have chills, nausea, or back pain.   You have trouble changing your catheter.   Your catheter comes out.   You have blood in your urine.   You have no urine flow for 1 hour.   You have a fever.  Document Released: 06/03/2011 Document Revised: 09/04/2011 Document Reviewed: 06/03/2011 [facility] Patient Information 2012 [place].

## 2012-04-27 ENCOUNTER — Encounter (HOSPITAL_COMMUNITY): Payer: Self-pay | Admitting: *Deleted

## 2012-04-27 ENCOUNTER — Encounter (HOSPITAL_COMMUNITY)
Admission: RE | Disposition: A | Discharge status: Home / Self Care | Payer: Self-pay | Source: Ambulatory Visit | Attending: Urology

## 2012-04-27 ENCOUNTER — Ambulatory Visit (HOSPITAL_COMMUNITY)
Admission: RE | Admit: 2012-04-27 | Discharge: 2012-04-27 | Disposition: A | Discharge status: Home / Self Care | Payer: Medicare Other | Source: Ambulatory Visit | Attending: Urology | Admitting: Urology

## 2012-04-27 ENCOUNTER — Ambulatory Visit (HOSPITAL_COMMUNITY): Payer: Medicare Other | Admitting: Anesthesiology

## 2012-04-27 ENCOUNTER — Encounter (HOSPITAL_COMMUNITY): Payer: Self-pay | Admitting: Anesthesiology

## 2012-04-27 DIAGNOSIS — Z860101 Personal history of adenomatous and serrated colon polyps: Secondary | ICD-10-CM

## 2012-04-27 DIAGNOSIS — Z86010 Personal history of colonic polyps: Secondary | ICD-10-CM

## 2012-04-27 DIAGNOSIS — E870 Hyperosmolality and hypernatremia: Secondary | ICD-10-CM

## 2012-04-27 DIAGNOSIS — R338 Other retention of urine: Secondary | ICD-10-CM | POA: Diagnosis not present

## 2012-04-27 DIAGNOSIS — E860 Dehydration: Secondary | ICD-10-CM

## 2012-04-27 DIAGNOSIS — Z978 Presence of other specified devices: Secondary | ICD-10-CM

## 2012-04-27 DIAGNOSIS — G35 Multiple sclerosis: Secondary | ICD-10-CM

## 2012-04-27 DIAGNOSIS — J8410 Pulmonary fibrosis, unspecified: Secondary | ICD-10-CM

## 2012-04-27 DIAGNOSIS — N319 Neuromuscular dysfunction of bladder, unspecified: Secondary | ICD-10-CM | POA: Diagnosis not present

## 2012-04-27 DIAGNOSIS — Z01812 Encounter for preprocedural laboratory examination: Secondary | ICD-10-CM | POA: Insufficient documentation

## 2012-04-27 DIAGNOSIS — Z01810 Encounter for preprocedural cardiovascular examination: Secondary | ICD-10-CM | POA: Insufficient documentation

## 2012-04-27 DIAGNOSIS — I498 Other specified cardiac arrhythmias: Secondary | ICD-10-CM

## 2012-04-27 DIAGNOSIS — N390 Urinary tract infection, site not specified: Secondary | ICD-10-CM

## 2012-04-27 DIAGNOSIS — I10 Essential (primary) hypertension: Secondary | ICD-10-CM | POA: Diagnosis not present

## 2012-04-27 DIAGNOSIS — D649 Anemia, unspecified: Secondary | ICD-10-CM

## 2012-04-27 DIAGNOSIS — E039 Hypothyroidism, unspecified: Secondary | ICD-10-CM

## 2012-04-27 DIAGNOSIS — J189 Pneumonia, unspecified organism: Secondary | ICD-10-CM

## 2012-04-27 DIAGNOSIS — E876 Hypokalemia: Secondary | ICD-10-CM

## 2012-04-27 DIAGNOSIS — K529 Noninfective gastroenteritis and colitis, unspecified: Secondary | ICD-10-CM

## 2012-04-27 DIAGNOSIS — G4733 Obstructive sleep apnea (adult) (pediatric): Secondary | ICD-10-CM | POA: Insufficient documentation

## 2012-04-27 DIAGNOSIS — A0472 Enterocolitis due to Clostridium difficile, not specified as recurrent: Secondary | ICD-10-CM

## 2012-04-27 DIAGNOSIS — Z79899 Other long term (current) drug therapy: Secondary | ICD-10-CM | POA: Insufficient documentation

## 2012-04-27 DIAGNOSIS — G35D Multiple sclerosis, unspecified: Secondary | ICD-10-CM

## 2012-04-27 DIAGNOSIS — R911 Solitary pulmonary nodule: Secondary | ICD-10-CM

## 2012-04-27 DIAGNOSIS — R251 Tremor, unspecified: Secondary | ICD-10-CM

## 2012-04-27 SURGERY — INSERTION, SUPRAPUBIC CATHETER
Anesthesia: General | Wound class: Clean Contaminated

## 2012-04-27 MED ORDER — FENTANYL CITRATE 0.05 MG/ML IJ SOLN
INTRAMUSCULAR | Status: AC
  Filled 2012-04-27: qty 2

## 2012-04-27 MED ORDER — LIDOCAINE HCL 1 % IJ SOLN
INTRAMUSCULAR | Status: DC | PRN
  Administered 2012-04-27: 30 mg via INTRADERMAL

## 2012-04-27 MED ORDER — LIDOCAINE HCL (PF) 1 % IJ SOLN
INTRAMUSCULAR | Status: AC
  Filled 2012-04-27: qty 5

## 2012-04-27 MED ORDER — LACTATED RINGERS IV SOLN: INTRAVENOUS | Status: DC

## 2012-04-27 MED ORDER — SODIUM CHLORIDE 0.9 % IV SOLN
1.5000 g | INTRAVENOUS | Status: AC
  Administered 2012-04-27: 1.5 g via INTRAVENOUS
  Filled 2012-04-27: qty 1.5

## 2012-04-27 MED ORDER — BUPIVACAINE HCL (PF) 0.5 % IJ SOLN
INTRAMUSCULAR | Status: AC
  Filled 2012-04-27: qty 30

## 2012-04-27 MED ORDER — MIDAZOLAM HCL 2 MG/2ML IJ SOLN: 1.0000 mg | INTRAMUSCULAR | Status: DC | PRN

## 2012-04-27 MED ORDER — CIPROFLOXACIN HCL 250 MG PO TABS: 500.0000 mg | ORAL_TABLET | Freq: Two times a day (BID) | ORAL | Status: AC

## 2012-04-27 MED ORDER — ONDANSETRON HCL 4 MG/2ML IJ SOLN: 4.0000 mg | Freq: Once | INTRAMUSCULAR | Status: DC | PRN

## 2012-04-27 MED ORDER — FENTANYL CITRATE 0.05 MG/ML IJ SOLN
INTRAMUSCULAR | Status: DC | PRN
  Administered 2012-04-27 (×2): 50 ug via INTRAVENOUS

## 2012-04-27 MED ORDER — EPHEDRINE SULFATE 50 MG/ML IJ SOLN
INTRAMUSCULAR | Status: DC | PRN
  Administered 2012-04-27 (×2): 10 mg via INTRAVENOUS

## 2012-04-27 MED ORDER — BUPIVACAINE HCL (PF) 0.5 % IJ SOLN
INTRAMUSCULAR | Status: DC | PRN
  Administered 2012-04-27: 10 mL

## 2012-04-27 MED ORDER — STERILE WATER FOR IRRIGATION IR SOLN
Status: DC | PRN
  Administered 2012-04-27: 1000 mL

## 2012-04-27 MED ORDER — PROPOFOL 10 MG/ML IV EMUL
INTRAVENOUS | Status: AC
  Filled 2012-04-27: qty 20

## 2012-04-27 MED ORDER — PROPOFOL 10 MG/ML IV BOLUS
INTRAVENOUS | Status: DC | PRN
  Administered 2012-04-27: 120 mg via INTRAVENOUS

## 2012-04-27 MED ORDER — BACITRACIN ZINC 500 UNIT/GM EX OINT
TOPICAL_OINTMENT | CUTANEOUS | Status: AC
  Filled 2012-04-27: qty 0.9

## 2012-04-27 MED ORDER — STERILE WATER FOR IRRIGATION IR SOLN
Status: DC | PRN
  Administered 2012-04-27: 3000 mL

## 2012-04-27 MED ORDER — LACTATED RINGERS IV SOLN
INTRAVENOUS | Status: DC
  Administered 2012-04-27: 1000 mL via INTRAVENOUS

## 2012-04-27 MED ORDER — FENTANYL CITRATE 0.05 MG/ML IJ SOLN
25.0000 ug | INTRAMUSCULAR | Status: DC | PRN
  Administered 2012-04-27 (×2): 50 ug via INTRAVENOUS

## 2012-04-27 SURGICAL SUPPLY — 36 items
BAG DRAIN CYSTO-URO STER (DRAIN) ×2 IMPLANT
BAG HAMPER (MISCELLANEOUS) ×2 IMPLANT
BAG URINE DRAINAGE (UROLOGICAL SUPPLIES) ×1 IMPLANT
BLADE HEX COATED 2.75 (ELECTRODE) ×1 IMPLANT
BLADE SURG 15 STRL LF DISP TIS (BLADE) ×1 IMPLANT
BLADE SURG 15 STRL SS (BLADE) ×2
CATH FOLEY 2WAY SLVR  5CC 16FR (CATHETERS) ×1
CATH FOLEY 2WAY SLVR  5CC 22FR (CATHETERS) ×1
CATH FOLEY 2WAY SLVR 5CC 16FR (CATHETERS) IMPLANT
CATH FOLEY 2WAY SLVR 5CC 22FR (CATHETERS) IMPLANT
CLOTH BEACON ORANGE TIMEOUT ST (SAFETY) ×2 IMPLANT
COVER LIGHT HANDLE STERIS (MISCELLANEOUS) ×4 IMPLANT
DECANTER SPIKE VIAL GLASS SM (MISCELLANEOUS) ×1 IMPLANT
ELECT REM PT RETURN 9FT ADLT (ELECTROSURGICAL) ×2
ELECTRODE REM PT RTRN 9FT ADLT (ELECTROSURGICAL) ×1 IMPLANT
GAUZE SPONGE 4X4 16PLY XRAY LF (GAUZE/BANDAGES/DRESSINGS) ×1 IMPLANT
GLOVE BIO SURGEON STRL SZ8 (GLOVE) ×2 IMPLANT
GLOVE BIOGEL PI IND STRL 7.0 (GLOVE) IMPLANT
GLOVE BIOGEL PI INDICATOR 7.0 (GLOVE) ×1
GLOVE ECLIPSE 6.5 STRL STRAW (GLOVE) ×1 IMPLANT
GLOVE EXAM NITRILE MD LF STRL (GLOVE) ×1 IMPLANT
GOWN STRL REIN XL XLG (GOWN DISPOSABLE) ×3 IMPLANT
KIT BLADEGUARD II DBL (SET/KITS/TRAYS/PACK) ×2 IMPLANT
KIT ROOM TURNOVER AP CYSTO (KITS) ×2 IMPLANT
MANIFOLD NEPTUNE II (INSTRUMENTS) ×2 IMPLANT
NDL HYPO 25X1 1.5 SAFETY (NEEDLE) IMPLANT
NEEDLE HYPO 25X1 1.5 SAFETY (NEEDLE) ×2 IMPLANT
PACK CYSTO (CUSTOM PROCEDURE TRAY) ×2 IMPLANT
PENCIL HANDSWITCHING (ELECTRODE) ×2 IMPLANT
SPONGE DRAIN TRACH 4X4 STRL 2S (GAUZE/BANDAGES/DRESSINGS) ×1 IMPLANT
SUT SILK 2 0 FSL 18 (SUTURE) ×2 IMPLANT
SYR CONTROL 10ML LL (SYRINGE) ×1 IMPLANT
TAPE CLOTH SURG 4X10 WHT LF (GAUZE/BANDAGES/DRESSINGS) ×1 IMPLANT
TOWEL OR 17X26 4PK STRL BLUE (TOWEL DISPOSABLE) ×1 IMPLANT
WATER STERILE IRR 1000ML POUR (IV SOLUTION) ×1 IMPLANT
WATER STERILE IRR 3000ML UROMA (IV SOLUTION) ×2 IMPLANT

## 2012-04-27 NOTE — Anesthesia Preprocedure Evaluation (Addendum)
Anesthesia Evaluation  Patient identified by MRN, date of birth, ID band Patient awake    Reviewed: Allergy & Precautions, H&P , NPO status , Patient's Chart, lab work & pertinent test results  History of Anesthesia Complications Negative for: history of anesthetic complications  Airway Mallampati: I      Dental  (+) Teeth Intact   Pulmonary shortness of breath, pneumonia -, resolved,  breath sounds clear to auscultation        Cardiovascular hypertension, Pt. on medications + dysrhythmias Rhythm:Regular Rate:Bradycardia     Neuro/Psych TIA Neuromuscular disease    GI/Hepatic   Endo/Other  Hypothyroidism   Renal/GU      Musculoskeletal   Abdominal   Peds  Hematology   Anesthesia Other Findings   Reproductive/Obstetrics                          Anesthesia Physical Anesthesia Plan  ASA: III  Anesthesia Plan: General   Post-op Pain Management:    Induction: Intravenous  Airway Management Planned: LMA  Additional Equipment:   Intra-op Plan:   Post-operative Plan: Extubation in OR  Informed Consent: I have reviewed the patients History and Physical, chart, labs and discussed the procedure including the risks, benefits and alternatives for the proposed anesthesia with the patient or authorized representative who has indicated his/her understanding and acceptance.     Plan Discussed with:   Anesthesia Plan Comments:         Anesthesia Quick Evaluation

## 2012-04-27 NOTE — Anesthesia Postprocedure Evaluation (Signed)
  Anesthesia Post-op Note  Patient: Jordan Ward  Procedure(s) Performed: Procedure(s) (LRB): INSERTION OF SUPRAPUBIC CATHETER (N/A)  Patient Location: PACU  Anesthesia Type: General  Level of Consciousness: awake, alert  and oriented  Airway and Oxygen Therapy: Patient Spontanous Breathing and Patient connected to face mask oxygen  Post-op Pain: none  Post-op Assessment: Post-op Vital signs reviewed, Patient's Cardiovascular Status Stable, Respiratory Function Stable, Patent Airway and No signs of Nausea or vomiting  Post-op Vital Signs: Reviewed and stable  Complications: No apparent anesthesia complications

## 2012-04-27 NOTE — H&P (Signed)
Urology History and Physical Exam  CC: Urinary retention  HPI: 71 year old male presents for placement of a sp tube. He has been followed bu AUS for approximately the past year. He 1st presented w/ a ureteral stone in 2012, but at that time was also found to have a markedly distended bladder as well an bilateral hydro. THe AUR was felt to be due to a neurogenic bladder which was felt to be due to MS. Urodynamics confirmed an asensate, noncompliant bladder. He has dealt with a chronic indwelling foley since that time, and now presents for sp tube placement.  PMH: Past Medical History  Diagnosis Date  . Arthritis   . MS (multiple sclerosis)   . Insomnia   . PVD (peripheral vascular disease)   . Carotid artery stenosis   . TIA (transient ischemic attack)   . Kidney stones   . Chronic indwelling foley catheter 10/06/2011  . Pulmonary fibrosis 10/06/2011  . Dysphagia 10/07/2011  . Tremors of nervous system 10/08/2011  . Hypothyroidism 10/08/2011  . Pulmonary nodule 10/08/2011  . C. difficile colitis   . Pneumonia   . Encephalopathy   . Urinary tract infection   . Tremors of nervous system   . Hypokalemia   . Junctional rhythm   . Anemia   . Hypernatremia   . Sacral ulcer   . HTN (hypertension)   . HTN (hypertension), malignant 10/06/2011  . Sleep apnea   . Neuromuscular disorder   . Back pain, chronic     PSH: Past Surgical History  Procedure Date  . Inguinal hernia repair 1971    bilateral  . Back surgery 1976/1979  . Colonoscopy 11/2004    Dr. Sharol Roussel sessile polyp splenic flexure, 46mm sessile polyp desc colon, tubulovillous adenoma (bx not removed)  . Colonoscopy 01/2005    poor prep, polyp could not be found  . Colonoscopy 05/2005    with EMR, polypectomy Dr. Olegario Messier, bx showed high grade dysplasia, partially resected  . Colonoscopy 09/2005    Dr. Arsenio Loader, [place] ink tattooing, four villous colon polyp (3 had been missed on previous colonoscopies due to limitations of  procedures  . Colon surgery 09/2005    four tubular adenomas, no cancer  . Appendectomy 09/2005    at time of left hemicolectomy  . Colonoscopy 09/2006    normal TI, no polyps  . Colonoscopy 10/2007    Dr. Imogene Burn distal mammillations, benign bx, normal TI, random bx neg for microscopic colitis  . Cholecystectomy     Dr. Tamala Julian    Allergies: Allergies  Allergen Reactions  . Tetracyclines & Related Rash    Medications: No prescriptions prior to admission     Social History: History   Social History  . Marital Status: Married    Spouse Name: N/A    Number of Children: N/A  . Years of Education: N/A   Occupational History  . disability    Social History Main Topics  . Smoking status: Former Smoker    Types: Cigarettes  . Smokeless tobacco: Never Used   Comment: quit remote  . Alcohol Use: No  . Drug Use: No  . Sexually Active: Not on file   Other Topics Concern  . Not on file   Social History Narrative  . No narrative on file    Family History: Family History  Problem Relation Age of Onset  . Colon cancer Neg Hx   . Cirrhosis Brother     etoh  . Stroke Mother  10  . Coronary artery disease Father 69  . Heart attack Brother     Review of Systems: Positive: Urinary retention, weakness Negative:   A further 10 point review of systems was negative except what is listed in the HPI.  Physical Exam: @VITALS2 @ Constitutional: Well nourished and well developed . No acute distress.  ENT:. The ears and nose are normal in appearance.  Neck: The appearance of the neck is normal and no neck mass is present.  Pulmonary: No respiratory distress and normal respiratory rhythm and effort.  Cardiovascular:. No peripheral edema.  Genitourinary: Penile abraison is healing well without evidence of infection. Mild meatal irritation and skin breakdown/erosion due to chronic indwelling foley.  Skin: Normal skin turgor, no visible rash and no visible skin lesions.    Neuro/Psych:. Mood and affect are appropriate.     Studies:  No results found for this basename: HGB:2,WBC:2,PLT:2 in the last 72 hours  No results found for this basename: NA:2,K:2,CL:2,CO2:2,BUN:2,CREATININE:2,CALCIUM:2,MAGNESIUM:2,GFRNONAA:2,GFRAA:2 in the last 72 hours   No results found for this basename: PT:2,INR:2,APTT:2 in the last 72 hours   No components found with this basename: ABG:2    Assessment:    Neurogenic bladder with chronic indwelling Foley. The patient has multiple sclerosis and secondary bladder dysfunction   Plan: Placement of suprapubic tube

## 2012-04-27 NOTE — Progress Notes (Signed)
drsg to rt buttocks dry/intact for "old" ulcer.

## 2012-04-27 NOTE — Interval H&P Note (Signed)
History and Physical Interval Note:  04/27/2012 7:21 AM  Jordan Ward  has presented today for surgery, with the diagnosis of NEUROGENIC BLADDER  The various methods of treatment have been discussed with the patient and family. After consideration of risks, benefits and other options for treatment, the patient has consented to  Procedure(s) (LRB): INSERTION OF SUPRAPUBIC CATHETER (N/A) as a surgical intervention .  The patient's history has been reviewed, patient examined, no change in status, stable for surgery.  I have reviewed the patient's chart and labs.  Questions were answered to the patient's satisfaction.     [name]

## 2012-04-27 NOTE — Addendum Note (Signed)
Addendum  created 04/27/12 [id number] by Ollen Bowl, CRNA   Modules edited:Anesthesia LDA, Anesthesia Medication Administration

## 2012-04-27 NOTE — Transfer of Care (Signed)
Immediate Anesthesia Transfer of Care Note  Patient: Jordan Ward  Procedure(s) Performed: Procedure(s) (LRB): INSERTION OF SUPRAPUBIC CATHETER (N/A)  Patient Location: PACU  Anesthesia Type: General  Level of Consciousness: awake, alert  and oriented  Airway & Oxygen Therapy: Patient Spontanous Breathing and Patient connected to face mask oxygen  Post-op Assessment: Report given to PACU RN  Post vital signs: Reviewed and stable  Complications: No apparent anesthesia complications

## 2012-04-27 NOTE — Op Note (Signed)
Preoperative diagnosis: Chronic urinary retention  Postoperative diagnosis: Same  Principal procedure: Cystoscopy, placement of suprapubic tube  Surgeon: Frank Pilger  Anesthesia: Gen. with LMA  Complications: None  Drains: 22 French Foley catheter  Indications: This 71 year old  Male presents at this time for placement of suprapubic tube. He has a history of chronic urinary retention, necessitating long-term Foley catheter placement.He has chosen to have a suprapubic tube. Risks and complications of this procedure have been discussed with him at length. He understands these and desires to proceed.  Description of procedure: The patient was properly identified in the holding area, and received preoperative IV antibiotics. They were taken to the operating room where general anesthetic was administered using the LMA. There were placed in the dorsolithotomy position. Genitalia and perineum as well as lower abdomen were prepped and draped. Proper timeout was then performed.  The procedure then commenced. A 22 French cystoscope was advanced through the urethra which was found to be normal. Prostatic urethra was unremarkable. The bladder was entered and inspected circumferentially. There were scattered trabeculations. Ureteral orifices were normal in configuration and location. No lesions were noted. The cystoscope was then removed. A Lowsley retractor was then placed, with the tip angled against the anterior Schaum of the bladder. This was palpated through the suprapubic region. The Bovie was used to cut down on the tip of the Lowsley retractor, which was advanced to the skin. A 22 French Foley catheter with 10 cc balloon was then grasped and brought through into the bladder. The balloon was filled with 10 cc of water. Cystoscopy revealed adequate placement of the catheter, with the balloon within the lumen of the bladder. A 3-0 silk was then used to reapproximate the skin edges and fasten the catheter to  the skin. A dry sterile dressing was placed, and the catheter was placed to dependent drainage. The patient tolerated procedure well and was returned to the PACU in stable condition.

## 2012-04-27 NOTE — Anesthesia Procedure Notes (Signed)
Procedure Name: LMA Insertion Date/Time: 04/27/2012 7:44 AM Performed by: Tressie Stalker E Pre-anesthesia Checklist: Patient identified, Patient being monitored, Emergency Drugs available, Timeout performed and Suction available Patient Re-evaluated:Patient Re-evaluated prior to inductionOxygen Delivery Method: Circle System Utilized Preoxygenation: Pre-oxygenation with 100% oxygen Intubation Type: IV induction Ventilation: Mask ventilation without difficulty LMA: LMA inserted LMA Size: 4.0 Number of attempts: 1 Placement Confirmation: positive ETCO2 and breath sounds checked- equal and bilateral

## 2012-05-06 ENCOUNTER — Ambulatory Visit (HOSPITAL_COMMUNITY)
Admission: RE | Admit: 2012-05-06 | Discharge: 2012-05-06 | Disposition: A | Discharge status: Home / Self Care | Payer: Medicare Other | Source: Ambulatory Visit | Attending: Family Medicine | Admitting: Family Medicine

## 2012-05-06 DIAGNOSIS — IMO0001 Reserved for inherently not codable concepts without codable children: Secondary | ICD-10-CM | POA: Diagnosis not present

## 2012-05-06 DIAGNOSIS — L89109 Pressure ulcer of unspecified part of back, unspecified stage: Secondary | ICD-10-CM | POA: Insufficient documentation

## 2012-05-06 DIAGNOSIS — L8993 Pressure ulcer of unspecified site, stage 3: Secondary | ICD-10-CM | POA: Diagnosis not present

## 2012-05-06 NOTE — Progress Notes (Signed)
Physical Therapy - Wound Therapy  Re-Evaluation   Patient Details  Name: Jordan Ward MRN: [id number] Date of Birth: 07-29-1941  Today's Date: 05/06/2012 Time: 1105-1130 Time Calculation (min): 25 min  Visit#: 17  of 25   Re-eval: 06/05/12  Subjective Subjective Assessment Subjective: It is hurting today.  I just had surgery for my bladder and I need to be careful where all of my tubing is.  My wife is doing my wound care.   Date of Onset: 11/28/11  Pain Assessment Pain Assessment Pain Score:   6  Wound Therapy Pressure Ulcer 02/13/12 Stage III -  Full thickness tissue loss. Subcutaneous fat may be visible but bone, tendon or muscle are NOT exposed. Slough and Granulation tissue present (Active)  State of Healing Early/partial granulation 05/06/2012 11:36 AM  Site / Wound Assessment Clean;Granulation tissue 05/06/2012 11:36 AM  % Wound base Red or Granulating 85% 05/06/2012 11:36 AM  % Wound base Yellow 15% 05/06/2012 11:36 AM  Peri-wound Assessment Erythema (blanchable) 02/25/2012 10:00 AM  Wound Length (cm) 2.8 cm 05/06/2012 11:36 AM  Wound Width (cm) 2 cm 05/06/2012 11:36 AM  Wound Depth (cm) 0.4 cm 05/06/2012 11:36 AM  Tunneling (cm) 0.4 @ 5 O'clock 02/13/2012  1:18 PM  Undermining (cm) perimter of wound undermined at 0.4 cm (was 0.4 cm on 04/02/12)  05/06/2012 11:36 AM  Margins Attached edges (approximated) 04/05/2012  4:10 PM  Drainage Amount Moderate 05/06/2012 11:36 AM  Drainage Description Odor 05/06/2012 11:36 AM  Treatment Cleansed;Debridement (Selective);Hydrotherapy (Pulse lavage) 05/06/2012 11:36 AM  Dressing Type Alginate;Gauze (Comment);Transparent dressing 05/06/2012 11:36 AM  Dressing Changed;Clean;Dry;Intact;New drainage 05/06/2012 11:36 AM     Wound 10/02/11 Buttocks Posterior Excoriation to sacral/coccyx area of buttocks. Blanchable. (Active)     Wound 10/02/11 Abrasion(s) Penis Other (Comment) Abrasion noted to bottom of meatus of penis. Appears scabbed over. (Active)     Incision  04/27/12 Abdomen Other (Comment) (Active)  Site / Wound Assessment Clean;Dry 04/27/2012  9:07 AM  Drainage Amount None 04/27/2012  9:07 AM  Dressing Type Gauze (Comment);Tape dressing 04/27/2012  8:30 AM  Dressing Clean;Dry;Intact 04/27/2012  9:07 AM   Hydrotherapy Pulsed lavage therapy - wound location: PLSV to R sacral/gluteal wound Pulsed Lavage with Suction (psi): 5 psi Pulsed Lavage with Suction - Normal Saline Used: 1000 mL Selective Debridement Selective Debridement - Location: R sacral/gluteal Selective Debridement - Tools Used: Forceps Selective Debridement - Tissue Removed: slough   Physical Therapy Assessment and Plan Wound Therapy - Assess/Plan/Recommendations Wound Therapy - Clinical Statement: Jordan Ward was re-evaluated for R sacral/gluteal wound today: measurements: 2.8x2x0.4 w/85% granulation, 15% slough.  His measurments have decreased in L,W and D, however remain with 0.4cm of tunneling to wound.  It continues to have an odor.  Pt and wife will continue to treat at home and will order wound care supplies through [facility] to continue with home care.  Provided pt w/[facility] phone number for any questions or conerns he may have.  Hydrotherapy Plan: Debridement;Dressing change;Pulsatile lavage with suction Wound Therapy - Frequency: Other (comment) (1x/month for 5 months) Wound Therapy - Current Recommendations: PT Wound Plan: Continue with skilled care, continue with measurments and wound care.       Goals    Problem List Patient Active Problem List  Diagnosis  . Chronic diarrhea  . Hx of adenomatous colonic polyps  . Anemia  . C. difficile diarrhea  . Hypokalemia  . Dehydration  . PNA (pneumonia) probable  . Junctional rhythm  . Multiple sclerosis  .  UTI (lower urinary tract infection)  . HTN (hypertension), malignant  . Chronic indwelling foley catheter  . Pulmonary fibrosis  . Dysphagia  . Hypernatremia  . Tremors of nervous system  . Hypothyroidism  . Pulmonary  nodule    GP Functional Limitation: Self care Self Care Current Status [phone]): At least 40 percent but less than 60 percent impaired, limited or restricted Self Care Goal Status [id number]): At least 20 percent but less than 40 percent impaired, limited or restricted  Uri Turnbough 05/06/2012, 11:49 AM

## 2012-05-13 ENCOUNTER — Encounter (HOSPITAL_COMMUNITY): Payer: Self-pay | Admitting: *Deleted

## 2012-05-13 ENCOUNTER — Emergency Department (HOSPITAL_COMMUNITY)
Admission: EM | Admit: 2012-05-13 | Discharge: 2012-05-13 | Disposition: A | Discharge status: Home / Self Care | Payer: Medicare Other | Attending: Emergency Medicine | Admitting: Emergency Medicine

## 2012-05-13 DIAGNOSIS — T83010A Breakdown (mechanical) of cystostomy catheter, initial encounter: Secondary | ICD-10-CM

## 2012-05-13 DIAGNOSIS — Z8249 Family history of ischemic heart disease and other diseases of the circulatory system: Secondary | ICD-10-CM | POA: Insufficient documentation

## 2012-05-13 DIAGNOSIS — G35 Multiple sclerosis: Secondary | ICD-10-CM | POA: Diagnosis not present

## 2012-05-13 DIAGNOSIS — I10 Essential (primary) hypertension: Secondary | ICD-10-CM | POA: Diagnosis not present

## 2012-05-13 DIAGNOSIS — Z8673 Personal history of transient ischemic attack (TIA), and cerebral infarction without residual deficits: Secondary | ICD-10-CM | POA: Diagnosis not present

## 2012-05-13 DIAGNOSIS — I2510 Atherosclerotic heart disease of native coronary artery without angina pectoris: Secondary | ICD-10-CM | POA: Diagnosis not present

## 2012-05-13 DIAGNOSIS — G4730 Sleep apnea, unspecified: Secondary | ICD-10-CM | POA: Diagnosis not present

## 2012-05-13 DIAGNOSIS — T83091A Other mechanical complication of indwelling urethral catheter, initial encounter: Secondary | ICD-10-CM | POA: Diagnosis not present

## 2012-05-13 DIAGNOSIS — R109 Unspecified abdominal pain: Secondary | ICD-10-CM | POA: Diagnosis not present

## 2012-05-13 DIAGNOSIS — Z888 Allergy status to other drugs, medicaments and biological substances status: Secondary | ICD-10-CM | POA: Insufficient documentation

## 2012-05-13 DIAGNOSIS — Z823 Family history of stroke: Secondary | ICD-10-CM | POA: Insufficient documentation

## 2012-05-13 DIAGNOSIS — Y849 Medical procedure, unspecified as the cause of abnormal reaction of the patient, or of later complication, without mention of misadventure at the time of the procedure: Secondary | ICD-10-CM | POA: Insufficient documentation

## 2012-05-13 DIAGNOSIS — R339 Retention of urine, unspecified: Secondary | ICD-10-CM | POA: Diagnosis not present

## 2012-05-13 DIAGNOSIS — Z8379 Family history of other diseases of the digestive system: Secondary | ICD-10-CM | POA: Diagnosis not present

## 2012-05-13 DIAGNOSIS — Z87891 Personal history of nicotine dependence: Secondary | ICD-10-CM | POA: Diagnosis not present

## 2012-05-13 DIAGNOSIS — I739 Peripheral vascular disease, unspecified: Secondary | ICD-10-CM | POA: Diagnosis not present

## 2012-05-13 DIAGNOSIS — M129 Arthropathy, unspecified: Secondary | ICD-10-CM | POA: Insufficient documentation

## 2012-05-13 DIAGNOSIS — Z800 Family history of malignant neoplasm of digestive organs: Secondary | ICD-10-CM | POA: Diagnosis not present

## 2012-05-13 NOTE — ED Notes (Signed)
Catheter was irrigated. Urine came through the catheter and through his penis, EDP notified.

## 2012-05-13 NOTE — ED Notes (Signed)
Suprapubic catheter not functioning. Says it is not draining.since this am

## 2012-05-13 NOTE — ED Provider Notes (Signed)
History  This chart was scribed for Janice Norrie, MD by Jenne Campus. This patient was seen in room APA19/APA19 and the patient's care was started at 3:09PM.  CSN: [id number]  Arrival date & time 05/13/12  1315   First MD Initiated Contact with Patient 05/13/12 1509      Chief Complaint  Patient presents with  . Urinary Retention    The history is provided by the patient. No language interpreter was used.    Jordan Ward is a 71 y.o. male who presents to the Emergency Department complaining of his suprapubic catheter not functioning properly. Pt reports that he had his foley catheter which had been in place for a year replaced with a suprapubic cathter on July 30th, 2013. He reports that he fixed a kink this morning and afterwards noticed that he  was leaking urine from his penis. He reports that it has not worked properly since and states that he had to urinate through his penis.  He has a h/o MS and ambulates with a walker normally. He c/o suprapubic abdominal pain described as soreness attributed to the catheter insertion surgery but denies any other symptoms currently. He also has a h/o PVD, anemia and chronic back pain. Pt is a former smoker but denies alcohol use.   Dr. Jeffie Pollock and [place] are urologists PCP is with [facility]. PA Rowan Blase  Past Medical History  Diagnosis Date  . Arthritis   . MS (multiple sclerosis)   . Insomnia   . PVD (peripheral vascular disease)   . Carotid artery stenosis   . TIA (transient ischemic attack)   . Chronic indwelling foley catheter 10/06/2011  . Pulmonary fibrosis 10/06/2011  . Dysphagia 10/07/2011  . Tremors of nervous system 10/08/2011  . Hypothyroidism 10/08/2011  . Pulmonary nodule 10/08/2011  . C. difficile colitis   . Pneumonia   . Encephalopathy   . Urinary tract infection   . Tremors of nervous system   . Hypokalemia   . Junctional rhythm   . Anemia   . Hypernatremia   . Sacral ulcer   . HTN (hypertension)   . HTN (hypertension),  malignant 10/06/2011  . Sleep apnea   . Neuromuscular disorder   . Back pain, chronic   . Kidney stones     Past Surgical History  Procedure Date  . Inguinal hernia repair 1971    bilateral  . Back surgery 1976/1979  . Colonoscopy 11/2004    Dr. Sharol Roussel sessile polyp splenic flexure, 3mm sessile polyp desc colon, tubulovillous adenoma (bx not removed)  . Colonoscopy 01/2005    poor prep, polyp could not be found  . Colonoscopy 05/2005    with EMR, polypectomy Dr. Olegario Messier, bx showed high grade dysplasia, partially resected  . Colonoscopy 09/2005    Dr. Arsenio Loader, [place] ink tattooing, four villous colon polyp (3 had been missed on previous colonoscopies due to limitations of procedures  . Colon surgery 09/2005    four tubular adenomas, no cancer  . Appendectomy 09/2005    at time of left hemicolectomy  . Colonoscopy 09/2006    normal TI, no polyps  . Colonoscopy 10/2007    Dr. Imogene Burn distal mammillations, benign bx, normal TI, random bx neg for microscopic colitis  . Cholecystectomy     Dr. Tamala Julian  . Suprapubic catheter insertion     Family History  Problem Relation Age of Onset  . Colon cancer Neg Hx   . Cirrhosis Brother     etoh  .  Stroke Mother 60  . Coronary artery disease Father 52  . Heart attack Brother     History  Substance Use Topics  . Smoking status: Former Smoker    Types: Cigarettes  . Smokeless tobacco: Never Used   Comment: quit remote  . Alcohol Use: No   Lives at home Lives with spouse uses a walker  Review of Systems  Constitutional: Negative for fever and chills.  Gastrointestinal: Positive for abdominal pain (chronic from the catheter). Negative for nausea, vomiting and diarrhea.  All other systems reviewed and are negative.    Allergies  Tetracyclines & related  Home Medications   Current Outpatient Rx  Name Route Sig Dispense Refill  . IPRATROPIUM-ALBUTEROL 18-103 MCG/ACT IN AERO Inhalation Inhale 2 puffs into the  lungs 3 (three) times daily. 1 Inhaler 1  . CHOLESTYRAMINE 4 G PO PACK Oral Take 1 packet by mouth daily.    Marland Kitchen DIAZEPAM 5 MG PO TABS Oral Take 1 tablet (5 mg total) by mouth 2 (two) times daily. 30 tablet 0  . HYDROCODONE-ACETAMINOPHEN 7.5-650 MG PO TABS Oral Take 1 tablet by mouth every 6 (six) hours as needed. pain 30 tablet 0  . LEVOTHYROXINE SODIUM 50 MCG PO TABS Oral Take 1 tablet (50 mcg total) by mouth daily before breakfast. 30 tablet 2  . POTASSIUM CHLORIDE CRYS ER 20 MEQ PO TBCR Oral Take 1 tablet (20 mEq total) by mouth daily.    Marland Kitchen VERAPAMIL HCL 40 MG PO TABS Oral Take 40 mg by mouth Twice daily.    . ALBUTEROL SULFATE (2.5 MG/3ML) 0.083% IN NEBU Nebulization Take 2.5 mg by nebulization every 6 (six) hours as needed. For shortness of breath      Triage Vitals: BP 152/65  Pulse 78  Temp 98 F (36.7 C) (Oral)  Resp 18  Ht 5\' 7"  (1.702 m)  Wt 130 lb (58.968 kg)  BMI 20.36 kg/m2  SpO2 99%  Vital signs normal except hypertension   Physical Exam  Nursing note and vitals reviewed. Constitutional: He is oriented to person, place, and time. He appears well-developed and well-nourished. No distress.  HENT:  Head: Normocephalic and atraumatic.  Mouth/Throat: Oropharynx is clear and moist.  Eyes: Conjunctivae and EOM are normal. Pupils are equal, round, and reactive to light.  Neck: Neck supple. No tracheal deviation present.  Cardiovascular: Normal rate and regular rhythm.  Exam reveals no gallop and no friction rub.   No murmur heard. Pulmonary/Chest: Effort normal and breath sounds normal. No respiratory distress.  Abdominal: Soft.       Suprapubic catheter site is erythematous with sutures still in place, kink present in catheter that was fixed, catheter not draining  Musculoskeletal: Normal range of motion. He exhibits no edema.  Neurological: He is alert and oriented to person, place, and time. No cranial nerve deficit.  Skin: Skin is warm and dry.  Psychiatric: He has a  normal mood and affect. His behavior is normal.    ED Course  Procedures (including critical care time)  DIAGNOSTIC STUDIES: Oxygen Saturation is 99% on room air, normal by my interpretation.    COORDINATION OF CARE: 4:32PM-Discussed treatment plan which includes irrigation with pt at bedside and pt agreed to plan.  5:05Pm-Discussed pt's case with Dr. Janice Norrie Urologist on call for Dr Curlene Dolphin and he recommends that the pt come down to [place] so that he can evaluate him. Call ended at 5:06PM.  5:25PM-Discussed pt going to [facility] and pt reports that he  cannot find a ride down to [place]. Advised pt to call around to see if he can find a ride.  18:00 nurse reports the catheter is now draining.     1. Suprapubic catheter dysfunction     Plan discharge  Rolland Porter, MD, FACEP   MDM   I personally performed the services described in this documentation, which was scribed in my presence. The recorded information has been reviewed and considered. Rolland Porter, MD, FACEP        Janice Norrie, MD 05/13/12 [phone]

## 2012-05-15 ENCOUNTER — Encounter (HOSPITAL_COMMUNITY): Payer: Self-pay | Admitting: *Deleted

## 2012-05-15 ENCOUNTER — Emergency Department (HOSPITAL_COMMUNITY)
Admission: EM | Admit: 2012-05-15 | Discharge: 2012-05-15 | Disposition: A | Discharge status: Home / Self Care | Payer: Medicare Other | Attending: Emergency Medicine | Admitting: Emergency Medicine

## 2012-05-15 DIAGNOSIS — Z87891 Personal history of nicotine dependence: Secondary | ICD-10-CM | POA: Insufficient documentation

## 2012-05-15 DIAGNOSIS — T83091A Other mechanical complication of indwelling urethral catheter, initial encounter: Secondary | ICD-10-CM | POA: Diagnosis not present

## 2012-05-15 DIAGNOSIS — I739 Peripheral vascular disease, unspecified: Secondary | ICD-10-CM | POA: Diagnosis not present

## 2012-05-15 DIAGNOSIS — Z881 Allergy status to other antibiotic agents status: Secondary | ICD-10-CM | POA: Insufficient documentation

## 2012-05-15 DIAGNOSIS — I10 Essential (primary) hypertension: Secondary | ICD-10-CM | POA: Diagnosis not present

## 2012-05-15 DIAGNOSIS — Z87440 Personal history of urinary (tract) infections: Secondary | ICD-10-CM | POA: Diagnosis not present

## 2012-05-15 DIAGNOSIS — T839XXA Unspecified complication of genitourinary prosthetic device, implant and graft, initial encounter: Secondary | ICD-10-CM

## 2012-05-15 DIAGNOSIS — Y846 Urinary catheterization as the cause of abnormal reaction of the patient, or of later complication, without mention of misadventure at the time of the procedure: Secondary | ICD-10-CM | POA: Insufficient documentation

## 2012-05-15 DIAGNOSIS — Z87442 Personal history of urinary calculi: Secondary | ICD-10-CM | POA: Diagnosis not present

## 2012-05-15 DIAGNOSIS — E039 Hypothyroidism, unspecified: Secondary | ICD-10-CM | POA: Insufficient documentation

## 2012-05-15 DIAGNOSIS — G35 Multiple sclerosis: Secondary | ICD-10-CM | POA: Insufficient documentation

## 2012-05-15 DIAGNOSIS — T8389XA Other specified complication of genitourinary prosthetic devices, implants and grafts, initial encounter: Secondary | ICD-10-CM | POA: Insufficient documentation

## 2012-05-15 DIAGNOSIS — Z8673 Personal history of transient ischemic attack (TIA), and cerebral infarction without residual deficits: Secondary | ICD-10-CM | POA: Insufficient documentation

## 2012-05-15 DIAGNOSIS — G4700 Insomnia, unspecified: Secondary | ICD-10-CM | POA: Diagnosis not present

## 2012-05-15 DIAGNOSIS — Z79899 Other long term (current) drug therapy: Secondary | ICD-10-CM | POA: Diagnosis not present

## 2012-05-15 DIAGNOSIS — R339 Retention of urine, unspecified: Secondary | ICD-10-CM | POA: Diagnosis not present

## 2012-05-15 DIAGNOSIS — R109 Unspecified abdominal pain: Secondary | ICD-10-CM | POA: Diagnosis not present

## 2012-05-15 LAB — URINALYSIS, ROUTINE W REFLEX MICROSCOPIC
Bilirubin Urine: NEGATIVE
Ketones, ur: NEGATIVE mg/dL
Nitrite: NEGATIVE
Protein, ur: NEGATIVE mg/dL

## 2012-05-15 NOTE — ED Notes (Signed)
Pt requested a leg bag but then stated he wanted to wear foley bag home. Leg bag given to pt to change out himself.

## 2012-05-15 NOTE — ED Notes (Signed)
Pt states he took aleve and broke out in a rash and his mouth began to feel funny so he used his epi pen. Same happened this past Friday but he had thought it was due to new BBQ sauce. States he also had aleve on Friday also.

## 2012-05-15 NOTE — ED Notes (Signed)
Pt states he called urologist and was told to come to ED. States suprapubic cath is not working properly and his physician wants him to have foley cath inserted to urethra. NAD

## 2012-05-15 NOTE — ED Notes (Signed)
Pt c/o urine draining from suprapubic catheter and penis. Pt states that it has been going on since Thursday and patient seen in ED on Thursday. Pt's wife spoke with MD from [facility] and was told to bring him to the ED and have a regular catheter in but do not touch the suprapubic catheter. Also called in med for bladder spasms.

## 2012-05-15 NOTE — ED Provider Notes (Addendum)
History  This chart was scribed for Richarda Blade, MD by Jenne Campus. This patient was seen in room APA12/APA12 and the patient's care was started at 2:32PM.  CSN: [id number]  Arrival date & time 05/15/12  1412   First MD Initiated Contact with Patient 05/15/12 1432      Chief Complaint  Patient presents with  . Urinary Retention     The history is provided by the patient. No language interpreter was used.     Jordan Ward is a 71 y.o. male who presents to the Emergency Department complaining of 4 days of intermittent urine draining from his penis and his suprapubic catheter. He was seen 4 days ago in this ED and had the foley irrigated which improvement in his symptoms but states that the symptoms began again this morning. Wife reports that she spoke with Dr. Milinda Hirschfeld from [facility] and was told to bring the pt to the ED to have a foley catheter placed. He also c/o mild suprapubic abdominal pain that he attributes to the catheter insertion done on 04/27/12. He denies fever, neck pain, sore throat, visual disturbance, CP, cough, SOB, nausea, emesis, diarrhea, back pain, HA, weakness, numbness and rash as associated symptoms.  He has a h/o MS, TIA, anemia and HTN. He is a former smoker but denies alcohol use.  Urologist is Dr. Milinda Hirschfeld.    Past Medical History  Diagnosis Date  . Arthritis   . MS (multiple sclerosis)   . Insomnia   . PVD (peripheral vascular disease)   . Carotid artery stenosis   . TIA (transient ischemic attack)   . Chronic indwelling foley catheter 10/06/2011  . Pulmonary fibrosis 10/06/2011  . Dysphagia 10/07/2011  . Tremors of nervous system 10/08/2011  . Hypothyroidism 10/08/2011  . Pulmonary nodule 10/08/2011  . C. difficile colitis   . Pneumonia   . Encephalopathy   . Urinary tract infection   . Tremors of nervous system   . Hypokalemia   . Junctional rhythm   . Anemia   . Hypernatremia   . Sacral ulcer   . HTN (hypertension)   . HTN (hypertension),  malignant 10/06/2011  . Sleep apnea   . Neuromuscular disorder   . Back pain, chronic   . Kidney stones     Past Surgical History  Procedure Date  . Inguinal hernia repair 1971    bilateral  . Back surgery 1976/1979  . Colonoscopy 11/2004    Dr. Sharol Roussel sessile polyp splenic flexure, 82mm sessile polyp desc colon, tubulovillous adenoma (bx not removed)  . Colonoscopy 01/2005    poor prep, polyp could not be found  . Colonoscopy 05/2005    with EMR, polypectomy Dr. Olegario Messier, bx showed high grade dysplasia, partially resected  . Colonoscopy 09/2005    Dr. Arsenio Loader, [place] ink tattooing, four villous colon polyp (3 had been missed on previous colonoscopies due to limitations of procedures  . Colon surgery 09/2005    four tubular adenomas, no cancer  . Appendectomy 09/2005    at time of left hemicolectomy  . Colonoscopy 09/2006    normal TI, no polyps  . Colonoscopy 10/2007    Dr. Imogene Burn distal mammillations, benign bx, normal TI, random bx neg for microscopic colitis  . Cholecystectomy     Dr. Tamala Julian  . Suprapubic catheter insertion     Family History  Problem Relation Age of Onset  . Colon cancer Neg Hx   . Cirrhosis Brother     etoh  .  Stroke Mother 70  . Coronary artery disease Father 44  . Heart attack Brother     History  Substance Use Topics  . Smoking status: Former Smoker    Types: Cigarettes  . Smokeless tobacco: Never Used   Comment: quit remote  . Alcohol Use: No      Review of Systems  A complete 10 system review of systems was obtained and all systems are negative except as noted in the HPI and PMH.   Allergies  Tetracyclines & related  Home Medications   Current Outpatient Rx  Name Route Sig Dispense Refill  . ALBUTEROL SULFATE (2.5 MG/3ML) 0.083% IN NEBU Nebulization Take 2.5 mg by nebulization every 6 (six) hours as needed. For shortness of breath    . IPRATROPIUM-ALBUTEROL 18-103 MCG/ACT IN AERO Inhalation Inhale 2 puffs into the  lungs 3 (three) times daily. 1 Inhaler 1  . CHOLESTYRAMINE 4 G PO PACK Oral Take 1 packet by mouth daily.    Marland Kitchen DIAZEPAM 5 MG PO TABS Oral Take 1 tablet (5 mg total) by mouth 2 (two) times daily. 30 tablet 0  . HYDROCODONE-ACETAMINOPHEN 7.5-650 MG PO TABS Oral Take 1 tablet by mouth every 6 (six) hours as needed. pain 30 tablet 0  . LEVOTHYROXINE SODIUM 50 MCG PO TABS Oral Take 1 tablet (50 mcg total) by mouth daily before breakfast. 30 tablet 2  . POTASSIUM CHLORIDE CRYS ER 20 MEQ PO TBCR Oral Take 1 tablet (20 mEq total) by mouth daily.    Marland Kitchen VERAPAMIL HCL 40 MG PO TABS Oral Take 40 mg by mouth Twice daily.      Triage Vitals: BP 173/72  Pulse 74  Temp 97.8 F (36.6 C) (Oral)  Resp 20  Ht 5\' 6"  (1.676 m)  Wt 126 lb (57.153 kg)  BMI 20.34 kg/m2  SpO2 100%  Physical Exam  Nursing note and vitals reviewed. Constitutional: He is oriented to person, place, and time. He appears well-developed and well-nourished.  HENT:  Head: Normocephalic and atraumatic.  Right Ear: External ear normal.  Left Ear: External ear normal.  Eyes: Conjunctivae and EOM are normal. Pupils are equal, round, and reactive to light.  Neck: Normal range of motion and phonation normal. Neck supple.  Cardiovascular: Normal rate, regular rhythm, normal heart sounds and intact distal pulses.   Pulmonary/Chest: Effort normal and breath sounds normal. He exhibits no bony tenderness.  Abdominal: Soft. Normal appearance. There is no tenderness.       RLQ suprapubic catheter ostomy: the edges are irregular with minimal erythema, no discharge, swelling or urine leakage from the ostomy, the suprapubic catheter appears intact and there is yellow urine present in the bag Can not palpate the bladder  Genitourinary: Penis normal. Uncircumcised.  Musculoskeletal: Normal range of motion.  Neurological: He is alert and oriented to person, place, and time. He has normal strength. No cranial nerve deficit or sensory deficit. He  exhibits normal muscle tone. Coordination normal.  Skin: Skin is warm, dry and intact.  Psychiatric: He has a normal mood and affect. His behavior is normal. Judgment and thought content normal.    ED Course  Procedures (including critical care time)  DIAGNOSTIC STUDIES: Oxygen Saturation is 100% on room air, normal by my interpretation.    COORDINATION OF CARE: 2:51PM-Discussed treatment plan of foley catheter with pt at bedside and pt agreed to plan.  3:38PM-Pt rechecked and states that the pain has improved since the foley insertion.  Urine Culture sent  Labs Reviewed  URINALYSIS, ROUTINE W REFLEX MICROSCOPIC - Abnormal; Notable for the following:    APPearance CLOUDY (*)     Hgb urine dipstick SMALL (*)     Leukocytes, UA SMALL (*)     All other components within normal limits  URINE MICROSCOPIC-ADD ON - Abnormal; Notable for the following:    Bacteria, UA FEW (*)     All other components within normal limits  URINE CULTURE   No results found.   1. Foley catheter problem       MDM  Nonspecific bladder and the supra- pubic catheter malfunction. Patient has been treated and is improved. He stable for d/c  I personally performed the services described in this documentation, which was scribed in my presence. The recorded information has been reviewed and considered.    Plan: Home Medications- usual; Home Treatments- rest; Recommended follow up- Urology in 2-3 days       Richarda Blade, MD 05/15/12 Bosie Helper  Richarda Blade, MD 05/15/12 [phone]

## 2012-05-17 LAB — URINE CULTURE: Colony Count: 45000

## 2012-05-18 NOTE — ED Notes (Signed)
+   Urine Chart sent to EDP office for review. 

## 2012-05-20 NOTE — ED Notes (Signed)
Low colony count-no treatment currently indicated-ensure patient follow up with neurologist/PCP per Etta Quill

## 2012-05-25 ENCOUNTER — Ambulatory Visit (INDEPENDENT_AMBULATORY_CARE_PROVIDER_SITE_OTHER): Payer: Medicare Other | Admitting: Urology

## 2012-05-25 DIAGNOSIS — N319 Neuromuscular dysfunction of bladder, unspecified: Secondary | ICD-10-CM

## 2012-06-04 DIAGNOSIS — J8410 Pulmonary fibrosis, unspecified: Secondary | ICD-10-CM | POA: Diagnosis not present

## 2012-06-04 DIAGNOSIS — G35 Multiple sclerosis: Secondary | ICD-10-CM | POA: Diagnosis not present

## 2012-06-04 DIAGNOSIS — Q799 Congenital malformation of musculoskeletal system, unspecified: Secondary | ICD-10-CM | POA: Diagnosis not present

## 2012-06-04 DIAGNOSIS — R635 Abnormal weight gain: Secondary | ICD-10-CM | POA: Diagnosis not present

## 2012-06-04 DIAGNOSIS — E039 Hypothyroidism, unspecified: Secondary | ICD-10-CM | POA: Diagnosis not present

## 2012-06-04 DIAGNOSIS — R634 Abnormal weight loss: Secondary | ICD-10-CM | POA: Diagnosis not present

## 2012-06-09 ENCOUNTER — Ambulatory Visit (HOSPITAL_COMMUNITY)
Admission: RE | Admit: 2012-06-09 | Discharge: 2012-06-09 | Disposition: A | Discharge status: Home / Self Care | Payer: Medicare Other | Source: Ambulatory Visit | Attending: Family Medicine | Admitting: Family Medicine

## 2012-06-09 DIAGNOSIS — L8993 Pressure ulcer of unspecified site, stage 3: Secondary | ICD-10-CM | POA: Insufficient documentation

## 2012-06-09 DIAGNOSIS — L89109 Pressure ulcer of unspecified part of back, unspecified stage: Secondary | ICD-10-CM | POA: Insufficient documentation

## 2012-06-09 DIAGNOSIS — IMO0001 Reserved for inherently not codable concepts without codable children: Secondary | ICD-10-CM | POA: Diagnosis not present

## 2012-06-09 NOTE — Progress Notes (Signed)
Physical Therapy - Wound Therapy  Re-Evaluation   Patient Details  Name: WYLAND GANTT MRN: [id number] Date of Birth: 1940-12-14  Today's Date: 06/09/2012 Time: [id number] Time Calculation (min): 28 min Charges: 1 deb x15 min Self Care Visit#: 18  of 25   Re-eval: 07/09/12  Subjective Subjective Assessment Subjective: I have been up here 3x because of problems with my catheter.  I am not able to walk around as much as I would like because my wife takes care of the kids and I cannot go outside by myself.  Date of Onset: 11/28/11  Pain Assessment Pain Assessment Pain Score:   5 Pain Location: Buttocks Pain Orientation: Right  Wound Therapy Pressure Ulcer 02/13/12 Stage III -  Full thickness tissue loss. Subcutaneous fat may be visible but bone, tendon or muscle are NOT exposed. Slough and Granulation tissue present (Active)  State of Healing Early/partial granulation 06/09/2012 10:45 AM  Site / Wound Assessment Clean;Granulation tissue 06/09/2012 10:45 AM  % Wound base Red or Granulating 90% 06/09/2012 10:45 AM  % Wound base Yellow 10% 06/09/2012 10:45 AM  Peri-wound Assessment Erythema (blanchable) 02/25/2012 10:00 AM  Wound Length (cm) 1.4 cm  06/09/2012 10:45 AM  Wound Width (cm) 2.1 cm 06/09/2012 10:45 AM  Wound Depth (cm) 0.5 cm 06/09/2012 10:45 AM  Tunneling (cm) 0.8- 0.9 From 8 O'clock to 12 O'clock to 3 O'clock 06/09/2012 10:45 AM  Undermining (cm) perimter of wound undermined at 0.4 cm (was 0.4 cm on 04/02/12)  05/06/2012 11:36 AM  Margins Attached edges (approximated) 04/05/2012  4:10 PM  Drainage Amount Moderate 06/09/2012 10:45 AM  Drainage Description Odor 06/09/2012 10:45 AM  Treatment Cleansed;Debridement (Selective);Hydrotherapy (Pulse lavage) 05/06/2012 11:36 AM  Dressing Type Alginate;Gauze (Comment);Tape dressing 06/09/2012 10:45 AM  Dressing Changed;Clean;Dry;Intact;New drainage 06/09/2012 10:45 AM     Wound 10/02/11 Buttocks Posterior Excoriation to sacral/coccyx area of  buttocks. Blanchable. (Active)     Wound 10/02/11 Abrasion(s) Penis Other (Comment) Abrasion noted to bottom of meatus of penis. Appears scabbed over. (Active)     Incision 04/27/12 Abdomen Other (Comment) (Active)   Selective Debridement Selective Debridement - Location: R sacral/gluteal Selective Debridement - Tools Used: Forceps;Scalpel Selective Debridement - Tissue Removed: slough   Physical Therapy Assessment and Plan Wound Therapy - Assess/Plan/Recommendations Wound Therapy - Clinical Statement: Mr. Lollis was re-evaluated for R sacral/gluteal wound today: Measurements: 1.4 x 2.1 x0.5. w/10% slough, 90% granulation.  His length has decreased, however remain concerned about increased tunnelling around the wound.  Wound also presented with a potent odor which he reports has been present for the past few days.  He is also presenting with a stage one pressure ulcer to L ischial region.  Educated pt to Arts administrator (wife) to pack in his wound to ensure proper closure to the wound, increase mobility to decrease pressure risk of pressure ulcers.  Will have patient check in 1 month and if tunnelling does not improve will pick back up for more aggressive wound care.  Recommend pt call doctor to discuss antibiotic.  Wound Therapy - Frequency:  (1x/month x4 months) Wound Plan: Continue with skilled care, continue with measurments and wound care      Goals    Problem List Patient Active Problem List  Diagnosis  . Chronic diarrhea  . Hx of adenomatous colonic polyps  . Anemia  . C. difficile diarrhea  . Hypokalemia  . Dehydration  . PNA (pneumonia) probable  . Junctional rhythm  . Multiple sclerosis  . UTI (lower  urinary tract infection)  . HTN (hypertension), malignant  . Chronic indwelling foley catheter  . Pulmonary fibrosis  . Dysphagia  . Hypernatremia  . Tremors of nervous system  . Hypothyroidism  . Pulmonary nodule    GP Functional Limitation: Self care Self Care  Current Status [phone]): At least 20 percent but less than 40 percent impaired, limited or restricted Self Care Goal Status [id number]): At least 1 percent but less than 20 percent impaired, limited or restricted  Zakyla Tonche 06/09/2012, 10:55 AM

## 2012-06-10 ENCOUNTER — Ambulatory Visit (HOSPITAL_COMMUNITY): Payer: Medicare Other | Admitting: Physical Therapy

## 2012-06-18 ENCOUNTER — Ambulatory Visit (INDEPENDENT_AMBULATORY_CARE_PROVIDER_SITE_OTHER): Payer: Medicare Other | Admitting: Urology

## 2012-06-18 DIAGNOSIS — N319 Neuromuscular dysfunction of bladder, unspecified: Secondary | ICD-10-CM

## 2012-06-29 ENCOUNTER — Ambulatory Visit (INDEPENDENT_AMBULATORY_CARE_PROVIDER_SITE_OTHER): Payer: Medicare Other | Admitting: Urology

## 2012-06-29 ENCOUNTER — Ambulatory Visit (HOSPITAL_COMMUNITY)
Admission: RE | Admit: 2012-06-29 | Discharge: 2012-06-29 | Disposition: A | Discharge status: Home / Self Care | Payer: Medicare Other | Source: Ambulatory Visit | Attending: Family Medicine | Admitting: Family Medicine

## 2012-06-29 DIAGNOSIS — IMO0001 Reserved for inherently not codable concepts without codable children: Secondary | ICD-10-CM | POA: Diagnosis not present

## 2012-06-29 DIAGNOSIS — L8993 Pressure ulcer of unspecified site, stage 3: Secondary | ICD-10-CM | POA: Insufficient documentation

## 2012-06-29 DIAGNOSIS — L89109 Pressure ulcer of unspecified part of back, unspecified stage: Secondary | ICD-10-CM | POA: Diagnosis not present

## 2012-06-29 DIAGNOSIS — N319 Neuromuscular dysfunction of bladder, unspecified: Secondary | ICD-10-CM

## 2012-06-29 DIAGNOSIS — N3941 Urge incontinence: Secondary | ICD-10-CM | POA: Diagnosis not present

## 2012-06-29 NOTE — Progress Notes (Signed)
Physical Therapy - Wound Therapy  Re-Evaluation   Patient Details  Name: BARTOLO BONACCORSO MRN: [id number] Date of Birth: [birth year]  Today's Date: 06/29/2012 Time: [id number] Time Calculation (min): 41 min Charges: 1 re-evaluation, 1 selective Deb < 20 cm, 20' self care Visit#: 12  of 25   Re-eval: 07/09/12  Subjective  Pt reports wife has been changing his wound every few days.  Continues to have significant drainage and is complaining of pain to L gluteal region.   Pain Assessment  Mild pain with underminning @ 5 O'clock  Wound Therapy Pressure Ulcer 02/13/12 Stage III -  Full thickness tissue loss. Subcutaneous fat may be visible but bone, tendon or muscle are NOT exposed. Slough and Granulation tissue present (Active)  State of Healing Early/partial granulation 06/29/2012 11:46 AM  Site / Wound Assessment Clean;Granulation tissue 06/29/2012 11:46 AM  % Wound base Red or Granulating 90% 06/29/2012 11:46 AM  % Wound base Yellow 10% 06/29/2012 11:46 AM  Peri-wound Assessment Erythema (blanchable) 02/25/2012 10:00 AM  Wound Length (cm) 2 cm 06/29/2012 11:46 AM  Wound Width (cm) 2 cm 06/29/2012 11:46 AM  Wound Depth (cm) 0.5 cm 06/29/2012 11:46 AM  Tunneling (cm) 0.8- 0.9 From 8 O'clock to 12 O'clock to 3 O'clock 06/09/2012 10:45 AM  Undermining (cm) 0.7 cm from 8-12-3 perimeter around wound (was at 0.4 cm) 06/29/2012 11:46 AM  Margins Attached edges (approximated) 04/05/2012  4:10 PM  Drainage Amount Moderate 06/29/2012 11:46 AM  Drainage Description No odor 06/29/2012 11:46 AM  Treatment Cleansed;Debridement (Selective);Hydrotherapy (Pulse lavage) 05/06/2012 11:36 AM  Dressing Type Alginate;Gauze (Comment);Tape dressing 06/29/2012 11:46 AM  Dressing Changed;Clean;Dry;Intact;New drainage 06/29/2012 11:46 AM     Wound 10/02/11 Buttocks Posterior Excoriation to sacral/coccyx area of buttocks. Blanchable. (Active)     Wound 10/02/11 Abrasion(s) Penis Other (Comment) Abrasion noted to bottom of meatus  of penis. Appears scabbed over. (Active)     Incision 04/27/12 Abdomen Other (Comment) (Active)   Selective Debridement Selective Debridement - Location: [place] to R gluteal wound    Physical Therapy Assessment and Plan Wound Therapy - Assess/Plan/Recommendations Wound Therapy - Clinical Statement: Mr Geissler was re-evaluated today for R sacral/gluteal wound.  Measurments: 2.0cmx2.0 cmx0.4 and 0.7 cm undermining.  His wife is changing dressings every few days and continues to have increased drainage without odor.  He has a stage I pressure ulcer on L gluteal region (under L ischial tuberosity).  Discussed with pt posiibilities for wound vac to apply constant negative pressure.  Also educated pt again on importance of mobility and pressure relief strategies.  Discussed skilled OP PT is no longer needed considering wound is healthy, however he needs assitance with wound undermining and closure in which I would recommend a wound vac.  Recommend f/u w/MD to discuss wound vac and other options to improve wound closure.  Wound Plan: D/C w/strong recommendations for WOUND VAC      Goals Wound Therapy Goals - Improve the function of patient's integumentary system by progressing the wound(s) through the phases of wound healing by: Decrease Necrotic Tissue to: 0% Decrease Necrotic Tissue - Progress: Progressing toward goal Increase Granulation Tissue to: 100% Increase Granulation Tissue - Progress: Progressing toward goal Decrease Length/Width/Depth by (cm): 1x1x0.1x0.1 Decrease Length/Width/Depth - Progress: Not progressing Improve Drainage Characteristics: Min Improve Drainage Characteristics - Progress: Not progressing Patient/Family will be able to : perform independent wound care Patient/Family Instruction Goal - Progress: Met Goals/treatment plan/discharge plan were made with and agreed upon by patient/family: Yes (and wife  Joann) Time For Goal Achievement: Other (comment) (12 weeks) Wound  Therapy - Potential for Goals: Fair (secondary to transportation issues)  Problem List Patient Active Problem List  Diagnosis  . Chronic diarrhea  . Hx of adenomatous colonic polyps  . Anemia  . C. difficile diarrhea  . Hypokalemia  . Dehydration  . PNA (pneumonia) probable  . Junctional rhythm  . Multiple sclerosis  . UTI (lower urinary tract infection)  . HTN (hypertension), malignant  . Chronic indwelling foley catheter  . Pulmonary fibrosis  . Dysphagia  . Hypernatremia  . Tremors of nervous system  . Hypothyroidism  . Pulmonary nodule    GP Functional Limitation: Self care Self Care Current Status [phone]): At least 20 percent but less than 40 percent impaired, limited or restricted Self Care Goal Status [id number]): At least 1 percent but less than 20 percent impaired, limited or restricted Self Care Discharge Status [phone]): At least 20 percent but less than 40 percent impaired, limited or restricted  Eloise Picone, PT 06/29/2012, 12:06 PM

## 2012-07-09 ENCOUNTER — Ambulatory Visit (HOSPITAL_COMMUNITY): Payer: Medicare Other | Admitting: *Deleted

## 2012-07-13 DIAGNOSIS — L89309 Pressure ulcer of unspecified buttock, unspecified stage: Secondary | ICD-10-CM | POA: Diagnosis not present

## 2012-07-13 DIAGNOSIS — IMO0002 Reserved for concepts with insufficient information to code with codable children: Secondary | ICD-10-CM | POA: Diagnosis not present

## 2012-07-22 DIAGNOSIS — Z9350 Unspecified cystostomy status: Secondary | ICD-10-CM | POA: Diagnosis not present

## 2012-07-22 DIAGNOSIS — K5900 Constipation, unspecified: Secondary | ICD-10-CM | POA: Diagnosis not present

## 2012-07-22 DIAGNOSIS — L89309 Pressure ulcer of unspecified buttock, unspecified stage: Secondary | ICD-10-CM | POA: Diagnosis not present

## 2012-07-22 DIAGNOSIS — L8993 Pressure ulcer of unspecified site, stage 3: Secondary | ICD-10-CM | POA: Diagnosis not present

## 2012-07-22 DIAGNOSIS — M549 Dorsalgia, unspecified: Secondary | ICD-10-CM | POA: Diagnosis not present

## 2012-07-22 DIAGNOSIS — G35 Multiple sclerosis: Secondary | ICD-10-CM | POA: Diagnosis not present

## 2012-07-22 DIAGNOSIS — L0233 Carbuncle of buttock: Secondary | ICD-10-CM | POA: Diagnosis not present

## 2012-07-22 DIAGNOSIS — I10 Essential (primary) hypertension: Secondary | ICD-10-CM | POA: Diagnosis not present

## 2012-07-22 DIAGNOSIS — Z8673 Personal history of transient ischemic attack (TIA), and cerebral infarction without residual deficits: Secondary | ICD-10-CM | POA: Diagnosis not present

## 2012-07-23 DIAGNOSIS — IMO0002 Reserved for concepts with insufficient information to code with codable children: Secondary | ICD-10-CM | POA: Diagnosis not present

## 2012-07-23 DIAGNOSIS — L89309 Pressure ulcer of unspecified buttock, unspecified stage: Secondary | ICD-10-CM | POA: Diagnosis not present

## 2012-07-23 DIAGNOSIS — L0291 Cutaneous abscess, unspecified: Secondary | ICD-10-CM | POA: Diagnosis not present

## 2012-07-23 DIAGNOSIS — L0390 Cellulitis, unspecified: Secondary | ICD-10-CM | POA: Diagnosis not present

## 2012-07-25 ENCOUNTER — Encounter (HOSPITAL_COMMUNITY): Payer: Self-pay

## 2012-07-25 ENCOUNTER — Emergency Department (HOSPITAL_COMMUNITY)
Admission: EM | Admit: 2012-07-25 | Discharge: 2012-07-25 | Disposition: A | Discharge status: Home / Self Care | Payer: Medicare Other | Attending: Emergency Medicine | Admitting: Emergency Medicine

## 2012-07-25 DIAGNOSIS — M129 Arthropathy, unspecified: Secondary | ICD-10-CM | POA: Diagnosis not present

## 2012-07-25 DIAGNOSIS — Z8719 Personal history of other diseases of the digestive system: Secondary | ICD-10-CM | POA: Diagnosis not present

## 2012-07-25 DIAGNOSIS — Z8709 Personal history of other diseases of the respiratory system: Secondary | ICD-10-CM | POA: Diagnosis not present

## 2012-07-25 DIAGNOSIS — I10 Essential (primary) hypertension: Secondary | ICD-10-CM | POA: Diagnosis not present

## 2012-07-25 DIAGNOSIS — Z87891 Personal history of nicotine dependence: Secondary | ICD-10-CM | POA: Diagnosis not present

## 2012-07-25 DIAGNOSIS — Z87442 Personal history of urinary calculi: Secondary | ICD-10-CM | POA: Insufficient documentation

## 2012-07-25 DIAGNOSIS — Z8669 Personal history of other diseases of the nervous system and sense organs: Secondary | ICD-10-CM | POA: Diagnosis not present

## 2012-07-25 DIAGNOSIS — Z79899 Other long term (current) drug therapy: Secondary | ICD-10-CM | POA: Insufficient documentation

## 2012-07-25 DIAGNOSIS — E039 Hypothyroidism, unspecified: Secondary | ICD-10-CM | POA: Diagnosis not present

## 2012-07-25 DIAGNOSIS — L02219 Cutaneous abscess of trunk, unspecified: Secondary | ICD-10-CM | POA: Insufficient documentation

## 2012-07-25 DIAGNOSIS — Z87440 Personal history of urinary (tract) infections: Secondary | ICD-10-CM | POA: Diagnosis not present

## 2012-07-25 DIAGNOSIS — I739 Peripheral vascular disease, unspecified: Secondary | ICD-10-CM | POA: Insufficient documentation

## 2012-07-25 DIAGNOSIS — T83091A Other mechanical complication of indwelling urethral catheter, initial encounter: Secondary | ICD-10-CM | POA: Diagnosis not present

## 2012-07-25 DIAGNOSIS — G4730 Sleep apnea, unspecified: Secondary | ICD-10-CM | POA: Diagnosis not present

## 2012-07-25 DIAGNOSIS — Z8673 Personal history of transient ischemic attack (TIA), and cerebral infarction without residual deficits: Secondary | ICD-10-CM | POA: Insufficient documentation

## 2012-07-25 DIAGNOSIS — E870 Hyperosmolality and hypernatremia: Secondary | ICD-10-CM | POA: Insufficient documentation

## 2012-07-25 DIAGNOSIS — L0291 Cutaneous abscess, unspecified: Secondary | ICD-10-CM

## 2012-07-25 DIAGNOSIS — L03319 Cellulitis of trunk, unspecified: Secondary | ICD-10-CM | POA: Insufficient documentation

## 2012-07-25 DIAGNOSIS — E876 Hypokalemia: Secondary | ICD-10-CM | POA: Diagnosis not present

## 2012-07-25 DIAGNOSIS — T83010A Breakdown (mechanical) of cystostomy catheter, initial encounter: Secondary | ICD-10-CM

## 2012-07-25 DIAGNOSIS — Z862 Personal history of diseases of the blood and blood-forming organs and certain disorders involving the immune mechanism: Secondary | ICD-10-CM | POA: Diagnosis not present

## 2012-07-25 DIAGNOSIS — Y846 Urinary catheterization as the cause of abnormal reaction of the patient, or of later complication, without mention of misadventure at the time of the procedure: Secondary | ICD-10-CM | POA: Insufficient documentation

## 2012-07-25 MED ORDER — LIDOCAINE-EPINEPHRINE (PF) 1 %-1:200000 IJ SOLN
INTRAMUSCULAR | Status: AC
  Filled 2012-07-25: qty 10

## 2012-07-25 NOTE — ED Notes (Signed)
EDP removed pt's pressure ulcer dressings. Wounds redressed with ointment applied per order. Wound dressings include skin protectant,tefla anti-adherent pad with an abdominal binder and soft cloth surgical tape was used to anchor both dressings.

## 2012-07-25 NOTE — ED Notes (Addendum)
Attempted irrigating catheter x2. Both times were unsuccessful. EDP aware. [facility] notified for Urology Cart.

## 2012-07-25 NOTE — ED Notes (Signed)
Pt states, " my catheter isnt working. Urine is coming out around the catheter in my stomach and out my penis" redness present to suprapubic site.

## 2012-07-25 NOTE — ED Provider Notes (Signed)
History   This chart was scribed for Rhunette Croft, MD by Hampton Abbot. This patient was seen in room APA04/APA04 and the patient's care was started at 07:26.   CSN: [id number]  Arrival date & time 07/25/12  0703   First MD Initiated Contact with Patient 07/25/12 [phone]      Chief Complaint  Patient presents with  . Body Fluid Exposure    suprapubic site leaking urine    (Consider location/radiation/quality/duration/timing/severity/associated sxs/prior treatment) The history is provided by the patient. No language interpreter was used.   Jordan Ward is a 71 y.o. male who presents to the Emergency Department complaining of urine leaking from site of suprapubic catheter insertion and urine leaking from his penis beginning over night.  Urine is non-bloody.  Pt has not tried flushing catheter.  Pt further complains of open sores on buttocks extending to perineal region.  Pt denies fever, chills, abdominal pain other than local to suprapubic catheter insertion, nausea, emesis, chest pain, dyspnea, or any further symptoms.  Pt has h/o MS, PVD, HTN.  Pt is a former smoker and denies alcohol use.   Urologists are Dr. Jeffie Pollock and Dr. Cipriano Bunker. Past Medical History  Diagnosis Date  . Arthritis   . MS (multiple sclerosis)   . Insomnia   . PVD (peripheral vascular disease)   . Carotid artery stenosis   . TIA (transient ischemic attack)   . Chronic indwelling foley catheter 10/06/2011  . Pulmonary fibrosis 10/06/2011  . Dysphagia 10/07/2011  . Tremors of nervous system 10/08/2011  . Hypothyroidism 10/08/2011  . Pulmonary nodule 10/08/2011  . C. difficile colitis   . Pneumonia   . Encephalopathy   . Urinary tract infection   . Tremors of nervous system   . Hypokalemia   . Junctional rhythm   . Anemia   . Hypernatremia   . Sacral ulcer   . HTN (hypertension)   . HTN (hypertension), malignant 10/06/2011  . Sleep apnea   . Neuromuscular disorder   . Back pain, chronic   . Kidney stones     Past  Surgical History  Procedure Date  . Inguinal hernia repair 1971    bilateral  . Back surgery 1976/1979  . Colonoscopy 11/2004    Dr. Sharol Roussel sessile polyp splenic flexure, 28mm sessile polyp desc colon, tubulovillous adenoma (bx not removed)  . Colonoscopy 01/2005    poor prep, polyp could not be found  . Colonoscopy 05/2005    with EMR, polypectomy Dr. Olegario Messier, bx showed high grade dysplasia, partially resected  . Colonoscopy 09/2005    Dr. Arsenio Loader, [place] ink tattooing, four villous colon polyp (3 had been missed on previous colonoscopies due to limitations of procedures  . Colon surgery 09/2005    four tubular adenomas, no cancer  . Appendectomy 09/2005    at time of left hemicolectomy  . Colonoscopy 09/2006    normal TI, no polyps  . Colonoscopy 10/2007    Dr. Imogene Burn distal mammillations, benign bx, normal TI, random bx neg for microscopic colitis  . Cholecystectomy     Dr. Tamala Julian  . Suprapubic catheter insertion     Family History  Problem Relation Age of Onset  . Colon cancer Neg Hx   . Cirrhosis Brother     etoh  . Stroke Mother 40  . Coronary artery disease Father 82  . Heart attack Brother     History  Substance Use Topics  . Smoking status: Former Smoker    Types: Cigarettes  .  Smokeless tobacco: Never Used   Comment: quit remote  . Alcohol Use: No      Review of Systems REVIEW OF SYSTEMS:   1.) CONSTITUTIONAL: No fever, chills or systemic signs of infection. No recent, unexplained weight changes.   2.) HEENT: No facial pain, sinus congestion or rhinorrhea is reported. Patient is denying any acute visual or hearing deficits. No sore throat or difficulty swallowing.  3.) NECK: No swelling or masses are reported.   4.) PULMONARY: No cough sputum production or shortness of breath was reported.   5.) CARDIAC: No palpitations, chest pain or pressure.   6.) ABDOMINAL: Denies abdominal pain, nausea, vomiting or diarrhea. No Hematochezia or  melena.  7.) GENITOURINARY: No burning with urination or frequency.  No discharge.  8.) BACK: Denying any flank or CVA tenderness. No specific thoracic or lumbar pain.   9.) EXTREMITIES: Denying any extremity edema pitting or rash.   10.) NEUROLOGIC: Denying any focal or lateralizing neurologic impairments.     11.) SKIN: No itching  12.) HEME/LYMPH: No easy bruising/bleeding, no lymphadenopathy  Allergies  Tetracyclines & related  Home Medications   Current Outpatient Rx  Name Route Sig Dispense Refill  . ALBUTEROL SULFATE (2.5 MG/3ML) 0.083% IN NEBU Nebulization Take 2.5 mg by nebulization every 6 (six) hours as needed. For shortness of breath    . IPRATROPIUM-ALBUTEROL 18-103 MCG/ACT IN AERO Inhalation Inhale 2 puffs into the lungs 3 (three) times daily. 1 Inhaler 1  . CHOLESTYRAMINE 4 G PO PACK Oral Take 1 packet by mouth daily.    Marland Kitchen DIAZEPAM 5 MG PO TABS Oral Take 1 tablet (5 mg total) by mouth 2 (two) times daily. 30 tablet 0  . HYDROCODONE-ACETAMINOPHEN 7.5-650 MG PO TABS Oral Take 1 tablet by mouth every 6 (six) hours as needed. pain 30 tablet 0  . LEVOTHYROXINE SODIUM 50 MCG PO TABS Oral Take 1 tablet (50 mcg total) by mouth daily before breakfast. 30 tablet 2  . POTASSIUM CHLORIDE CRYS ER 20 MEQ PO TBCR Oral Take 1 tablet (20 mEq total) by mouth daily.    Marland Kitchen VERAPAMIL HCL 40 MG PO TABS Oral Take 40 mg by mouth Twice daily.      BP 150/77  Pulse 67  Temp 98.2 F (36.8 C) (Oral)  Resp 18  SpO2 97%  Physical Exam  Nursing notes reviewed.  Electronic medical record reviewed. VITAL SIGNS:   Filed Vitals:   07/25/12 0716  BP: 150/77  Pulse: 67  Temp: 98.2 F (36.8 C)  TempSrc: Oral  Resp: 18  SpO2: 97%   CONSTITUTIONAL: Awake, oriented, appears non-toxic HENT: Atraumatic, normocephalic, oral mucosa pink and moist, airway patent. Nares patent without drainage. External ears normal. EYES: Conjunctiva clear, EOMI, PERRLA NECK: Trachea midline, non-tender,  supple CARDIOVASCULAR: Normal heart rate, Normal rhythm, No murmurs, rubs, gallops PULMONARY/CHEST: Clear to auscultation, no rhonchi, wheezes, or rales. Symmetrical breath sounds. Non-tender. ABDOMINAL: Non-distended, soft, non-tender - no rebound or guarding.  BS normal. NEUROLOGIC: Non-focal, moving all four extremities, no gross sensory or motor deficits. EXTREMITIES: No clubbing, cyanosis, or edema SKIN: Warm, Dry, No erythema,  ED Course  [place] bedside Performed by: Rhunette Croft Authorized by: Rhunette Croft Comments: Small abscess one half centimeter by half centimeter by centimeter located superior slightly to the right of midline of suprapubic catheter appear. No cellulitis.  INCISION AND DRAINAGE Performed by: Rhunette Croft Authorized by: Rhunette Croft Consent: Verbal consent obtained. Consent given by: patient Type: abscess Body area: trunk Location details:  abdomen Anesthesia: local infiltration Local anesthetic: lidocaine 1% with epinephrine Anesthetic total: 2 ml Patient sedated: no Scalpel size: 11 Incision type: single straight Complexity: simple Drainage: purulent Drainage amount: scant Wound treatment: wound left open Packing material: none Patient tolerance: Patient tolerated the procedure well with no immediate complications.   (including critical care time) DIAGNOSTIC STUDIES: Oxygen Saturation is 97% on room air, adequate by my interpretation.    COORDINATION OF CARE: 07:34- Patient informed of clinical course, understands medical decision-making process, and agrees with plan.  Discussed flushing catheter.      Labs Reviewed - No data to display No results found.   No diagnosis found.    MDM  Jordan Ward is a 71 y.o. male presenting with likely clogged catheter. Patient August also has a small abscess on his abdomen just superior to the suprapubic catheter site-will I&D this.  Catheter replaced with 24 French Foley with good urine  return.  Patient has followup with urology on Tuesday-he will keep that appointment. Instructions concerning wound care given.  I explained the diagnosis and have given explicit precautions to return to the ER including further problems with catheter, fevers, chills or any other new or worsening symptoms. The patient understands and accepts the medical plan as it's been dictated and I have answered their questions. Discharge instructions concerning home care and prescriptions have been given.  The patient is STABLE and is discharged to home in good condition.    I personally performed the services described in this documentation, which was scribed in my presence. The recorded information has been reviewed and considered. Rhunette Croft, M.D.           Rhunette Croft, MD 07/25/12 1251

## 2012-07-25 NOTE — ED Notes (Signed)
In to assist insertion of new suprapubic catheter. (24 Fr.) pt tolerated well. Upon d/c urine output was noted in catheter bag.

## 2012-07-26 DIAGNOSIS — I10 Essential (primary) hypertension: Secondary | ICD-10-CM | POA: Diagnosis not present

## 2012-07-26 DIAGNOSIS — L89309 Pressure ulcer of unspecified buttock, unspecified stage: Secondary | ICD-10-CM | POA: Diagnosis not present

## 2012-07-26 DIAGNOSIS — M549 Dorsalgia, unspecified: Secondary | ICD-10-CM | POA: Diagnosis not present

## 2012-07-26 DIAGNOSIS — G35 Multiple sclerosis: Secondary | ICD-10-CM | POA: Diagnosis not present

## 2012-07-26 DIAGNOSIS — L8993 Pressure ulcer of unspecified site, stage 3: Secondary | ICD-10-CM | POA: Diagnosis not present

## 2012-07-26 DIAGNOSIS — L0233 Carbuncle of buttock: Secondary | ICD-10-CM | POA: Diagnosis not present

## 2012-07-27 ENCOUNTER — Ambulatory Visit (INDEPENDENT_AMBULATORY_CARE_PROVIDER_SITE_OTHER): Payer: Medicare Other | Admitting: Urology

## 2012-07-27 DIAGNOSIS — N3941 Urge incontinence: Secondary | ICD-10-CM

## 2012-07-27 DIAGNOSIS — N319 Neuromuscular dysfunction of bladder, unspecified: Secondary | ICD-10-CM | POA: Diagnosis not present

## 2012-07-28 DIAGNOSIS — L0233 Carbuncle of buttock: Secondary | ICD-10-CM | POA: Diagnosis not present

## 2012-07-28 DIAGNOSIS — L0291 Cutaneous abscess, unspecified: Secondary | ICD-10-CM | POA: Diagnosis not present

## 2012-07-28 DIAGNOSIS — L8993 Pressure ulcer of unspecified site, stage 3: Secondary | ICD-10-CM | POA: Diagnosis not present

## 2012-07-28 DIAGNOSIS — IMO0002 Reserved for concepts with insufficient information to code with codable children: Secondary | ICD-10-CM | POA: Diagnosis not present

## 2012-07-28 DIAGNOSIS — M549 Dorsalgia, unspecified: Secondary | ICD-10-CM | POA: Diagnosis not present

## 2012-07-28 DIAGNOSIS — L89309 Pressure ulcer of unspecified buttock, unspecified stage: Secondary | ICD-10-CM | POA: Diagnosis not present

## 2012-07-28 DIAGNOSIS — G35 Multiple sclerosis: Secondary | ICD-10-CM | POA: Diagnosis not present

## 2012-07-28 DIAGNOSIS — I10 Essential (primary) hypertension: Secondary | ICD-10-CM | POA: Diagnosis not present

## 2012-08-02 DIAGNOSIS — M549 Dorsalgia, unspecified: Secondary | ICD-10-CM | POA: Diagnosis not present

## 2012-08-02 DIAGNOSIS — G35 Multiple sclerosis: Secondary | ICD-10-CM | POA: Diagnosis not present

## 2012-08-02 DIAGNOSIS — L8993 Pressure ulcer of unspecified site, stage 3: Secondary | ICD-10-CM | POA: Diagnosis not present

## 2012-08-02 DIAGNOSIS — L89309 Pressure ulcer of unspecified buttock, unspecified stage: Secondary | ICD-10-CM | POA: Diagnosis not present

## 2012-08-02 DIAGNOSIS — I10 Essential (primary) hypertension: Secondary | ICD-10-CM | POA: Diagnosis not present

## 2012-08-02 DIAGNOSIS — L0233 Carbuncle of buttock: Secondary | ICD-10-CM | POA: Diagnosis not present

## 2012-08-03 ENCOUNTER — Ambulatory Visit (HOSPITAL_COMMUNITY)
Admission: RE | Admit: 2012-08-03 | Discharge: 2012-08-03 | Disposition: A | Discharge status: Home / Self Care | Payer: Medicare Other | Source: Ambulatory Visit | Attending: Family Medicine | Admitting: Family Medicine

## 2012-08-03 DIAGNOSIS — L8993 Pressure ulcer of unspecified site, stage 3: Secondary | ICD-10-CM | POA: Insufficient documentation

## 2012-08-03 DIAGNOSIS — IMO0001 Reserved for inherently not codable concepts without codable children: Secondary | ICD-10-CM | POA: Insufficient documentation

## 2012-08-03 DIAGNOSIS — L89109 Pressure ulcer of unspecified part of back, unspecified stage: Secondary | ICD-10-CM | POA: Diagnosis not present

## 2012-08-03 NOTE — Progress Notes (Signed)
Physical Therapy - Wound Therapy  Evaluation   Patient Details  Name: AHAAN PAREKH MRN: [id number] Date of Birth: September 21, 1941  Today's Date: 08/03/2012 Time:  1500- [id number]    Visit#: 1  of 8   Re-eval: 09/02/12  Subjective Subjective Assessment Subjective: Mr. Percle is known to this clinic.  He has a hx of MS and had been being seen for a right sacral wound. The wound was very slow healing therefore once the wound was 100% granulated he was discharged to home health services.  The patient is being referrred to therapy a second time due to a new wound on his left sacral area.  The wounds are draining and  have slight odor.  The pt states the MD has stated that he needs to stay in his bed except to get up to go to the restroom to decrease the pressure on his wounds.  I also counseled Mr. Gallaway on proper nutriton stating that he needed to have an increased in protein intake.  Mr. Mcmickle stated that he could not afford the protein shakes; I explained that you could look for tuna on sale or eggs but he stated that he would not eat tuna.   Patient and Family Stated Goals: wounds to heal Date of Onset: 12/10/11 (for Right; L 06/29/12) Prior Treatments: nurse and wife doing dressing changes  Pain Assessment Pain Assessment Pain Assessment: 0-10 Pain Score:   9  Wound Therapy  Right Pressure Ulcer 02/13/12 Stage III -  Full thickness tissue loss. Subcutaneous fat may be visible but bone, tendon or muscle are NOT exposed. Slough and Granulation tissue present (Active)  State of Healing Early/partial granulation 06/29/2012 11:46 AM  Site / Wound Assessment Clean;Granulation tissue 06/29/2012 11:46 AM  % Wound base Red or Granulating 95% 08/03/2012  4:19 PM  % Wound base Yellow 5% 08/03/2012  4:19 PM  Peri-wound Assessment Erythema (blanchable) 08/03/2012  4:19 PM  Wound Length (cm) 1.8 cm 08/03/2012  4:19 PM  Wound Width (cm) 1 cm 08/03/2012  4:19 PM  Wound Depth (cm) 0.7 cm 08/03/2012  4:19 PM  Tunneling  (cm) 0.8- 0.9 From 8 O'clock to 12 O'clock to 3 O'clock 06/09/2012 10:45 AM  Undermining (cm) superiorly; 2 cm; laterally .7; inferiorly 1.2; medially .5 08/03/2012  4:19 PM  Margins Unattacted edges (unapproximated) 08/03/2012  4:19 PM  Drainage Amount Moderate 08/03/2012  4:19 PM  Drainage Description Serous;Odor 08/03/2012  4:19 PM  Treatment Hydrotherapy (Pulse lavage);Packing (Impregnated strip) 08/03/2012  4:19 PM  Dressing Type Alginate;Gauze (Comment);Tape dressing 06/29/2012 11:46 AM  Dressing Changed 08/03/2012  4:19 PM     Pressure Ulcer 08/03/12 Stage IV - Full thickness tissue loss with exposed bone, tendon or muscle. (Active) left  State of Healing Epithelialized 08/03/2012  4:19 PM  Site / Wound Assessment Granulation tissue;Painful;Pink 08/03/2012  4:19 PM  % Wound base Red or Granulating 85% 08/03/2012  4:19 PM  % Wound base Yellow 15% 08/03/2012  4:19 PM  Peri-wound Assessment Erythema (blanchable) 08/03/2012  4:19 PM  Wound Length (cm) 1.3 cm 08/03/2012  4:19 PM  Wound Width (cm) 1.4 cm 08/03/2012  4:19 PM  Wound Depth (cm) 0.8 cm 08/03/2012  4:19 PM  Undermining (cm) superior 13.0;  lateral 1.5; medial 1.5; inferiorly 2.0cm 08/03/2012  4:19 PM  Margins Unattacted edges (unapproximated) 08/03/2012  4:19 PM  Drainage Amount Moderate 08/03/2012  4:19 PM  Drainage Description Purulent 08/03/2012  4:19 PM  Treatment Debridement (Selective);Hydrotherapy (Pulse lavage) 08/03/2012  4:19  PM  Dressing Changed 08/03/2012  4:19 PM     Hydrotherapy Pulsed lavage therapy - wound location: entire wound Pulsed Lavage with Suction (psi): 5 psi Pulsed Lavage with Suction - Normal Saline Used: 500 mL Pulsed Lavage Tip: Tip with splash shield   Physical Therapy Assessment and Plan Wound Therapy - Assess/Plan/Recommendations Wound Therapy - Clinical Statement: Mr. Tornatore was evaluated for B sacral wounds.  The pt wounds have drainage as well as slight smell.  The left wound is new to this clinc but we had  been treating the right wound which size continues to be the same but undermining has increased considerably.  Therapist discussed the wound center in [facility] for possible oxygen tank but the pt stated that he would not be able to get up to  [facility] or [place] on a daily basis.  We will continue to see the pt 2 x week for pulse lavage until the drainage has decreased to min and there is no more odor.  At this point we will again refer Mr. Kass back to home health therapy.  Discussed the importance of staying off his sacrun as well as increasing protien consumption. Factors Delaying/Impairing Wound Healing: Altered sensation;Infection - systemic/local;Polypharmacy;Multiple medical problems Hydrotherapy Plan: Debridement;Pulsatile lavage with suction (L wound debrided all slough removed on first visit.) Wound Therapy - Frequency:  (2x) Wound Therapy - Current Recommendations: PT Wound Plan: see for pulse lavage - ask pt if wound vac is a possibility.      Goals Wound Therapy Goals - Improve the function of patient's integumentary system by progressing the wound(s) through the phases of wound healing by: Decrease Necrotic Tissue to: 0 Decrease Necrotic Tissue - Progress: Goal set today Increase Granulation Tissue to: 100 Increase Granulation Tissue - Progress: Goal set today Decrease Length/Width/Depth by (cm): decrease undermining by 1 cm in all direction. Decrease Length/Width/Depth - Progress: Goal set today Improve Drainage Characteristics: Min Improve Drainage Characteristics - Progress: Goal set today Patient/Family Instruction Goal - Progress: Goal set today Time For Goal Achievement:  (4 weeks) Wound Therapy - Potential for Goals: Fair  Problem List Patient Active Problem List  Diagnosis  . Chronic diarrhea  . Hx of adenomatous colonic polyps  . Anemia  . C. difficile diarrhea  . Hypokalemia  . Dehydration  . PNA (pneumonia) probable  . Junctional rhythm  . Multiple sclerosis  .  UTI (lower urinary tract infection)  . HTN (hypertension), malignant  . Chronic indwelling foley catheter  . Pulmonary fibrosis  . Dysphagia  . Hypernatremia  . Tremors of nervous system  . Hypothyroidism  . Pulmonary nodule    GP Functional Assessment Tool Used: clinical judgement Functional Limitation: Other PT primary Self Care Current Status [id number]): At least 60 percent but less than 80 percent impaired, limited or restricted Self Care Goal Status [id number]): At least 40 percent but less than 60 percent impaired, limited or restricted  RUSSELL,CINDY 08/03/2012, 4:38 PM

## 2012-08-04 DIAGNOSIS — L0233 Carbuncle of buttock: Secondary | ICD-10-CM | POA: Diagnosis not present

## 2012-08-04 DIAGNOSIS — L89309 Pressure ulcer of unspecified buttock, unspecified stage: Secondary | ICD-10-CM | POA: Diagnosis not present

## 2012-08-04 DIAGNOSIS — M549 Dorsalgia, unspecified: Secondary | ICD-10-CM | POA: Diagnosis not present

## 2012-08-04 DIAGNOSIS — I10 Essential (primary) hypertension: Secondary | ICD-10-CM | POA: Diagnosis not present

## 2012-08-04 DIAGNOSIS — L8993 Pressure ulcer of unspecified site, stage 3: Secondary | ICD-10-CM | POA: Diagnosis not present

## 2012-08-04 DIAGNOSIS — G35 Multiple sclerosis: Secondary | ICD-10-CM | POA: Diagnosis not present

## 2012-08-09 DIAGNOSIS — L0233 Carbuncle of buttock: Secondary | ICD-10-CM | POA: Diagnosis not present

## 2012-08-09 DIAGNOSIS — I10 Essential (primary) hypertension: Secondary | ICD-10-CM | POA: Diagnosis not present

## 2012-08-09 DIAGNOSIS — L8993 Pressure ulcer of unspecified site, stage 3: Secondary | ICD-10-CM | POA: Diagnosis not present

## 2012-08-09 DIAGNOSIS — L89309 Pressure ulcer of unspecified buttock, unspecified stage: Secondary | ICD-10-CM | POA: Diagnosis not present

## 2012-08-09 DIAGNOSIS — G35 Multiple sclerosis: Secondary | ICD-10-CM | POA: Diagnosis not present

## 2012-08-09 DIAGNOSIS — M549 Dorsalgia, unspecified: Secondary | ICD-10-CM | POA: Diagnosis not present

## 2012-08-12 DIAGNOSIS — E039 Hypothyroidism, unspecified: Secondary | ICD-10-CM | POA: Diagnosis not present

## 2012-08-16 DIAGNOSIS — L89309 Pressure ulcer of unspecified buttock, unspecified stage: Secondary | ICD-10-CM | POA: Diagnosis not present

## 2012-08-16 DIAGNOSIS — M549 Dorsalgia, unspecified: Secondary | ICD-10-CM | POA: Diagnosis not present

## 2012-08-16 DIAGNOSIS — G35 Multiple sclerosis: Secondary | ICD-10-CM | POA: Diagnosis not present

## 2012-08-16 DIAGNOSIS — L8993 Pressure ulcer of unspecified site, stage 3: Secondary | ICD-10-CM | POA: Diagnosis not present

## 2012-08-16 DIAGNOSIS — L0233 Carbuncle of buttock: Secondary | ICD-10-CM | POA: Diagnosis not present

## 2012-08-16 DIAGNOSIS — I10 Essential (primary) hypertension: Secondary | ICD-10-CM | POA: Diagnosis not present

## 2012-08-20 DIAGNOSIS — L8993 Pressure ulcer of unspecified site, stage 3: Secondary | ICD-10-CM | POA: Diagnosis not present

## 2012-08-20 DIAGNOSIS — M549 Dorsalgia, unspecified: Secondary | ICD-10-CM | POA: Diagnosis not present

## 2012-08-20 DIAGNOSIS — L89309 Pressure ulcer of unspecified buttock, unspecified stage: Secondary | ICD-10-CM | POA: Diagnosis not present

## 2012-08-20 DIAGNOSIS — G35 Multiple sclerosis: Secondary | ICD-10-CM | POA: Diagnosis not present

## 2012-08-20 DIAGNOSIS — I10 Essential (primary) hypertension: Secondary | ICD-10-CM | POA: Diagnosis not present

## 2012-08-20 DIAGNOSIS — L0233 Carbuncle of buttock: Secondary | ICD-10-CM | POA: Diagnosis not present

## 2012-08-23 DIAGNOSIS — G35 Multiple sclerosis: Secondary | ICD-10-CM | POA: Diagnosis not present

## 2012-08-23 DIAGNOSIS — M549 Dorsalgia, unspecified: Secondary | ICD-10-CM | POA: Diagnosis not present

## 2012-08-23 DIAGNOSIS — L8993 Pressure ulcer of unspecified site, stage 3: Secondary | ICD-10-CM | POA: Diagnosis not present

## 2012-08-23 DIAGNOSIS — L89309 Pressure ulcer of unspecified buttock, unspecified stage: Secondary | ICD-10-CM | POA: Diagnosis not present

## 2012-08-23 DIAGNOSIS — L0233 Carbuncle of buttock: Secondary | ICD-10-CM | POA: Diagnosis not present

## 2012-08-23 DIAGNOSIS — I10 Essential (primary) hypertension: Secondary | ICD-10-CM | POA: Diagnosis not present

## 2012-08-27 DIAGNOSIS — L8993 Pressure ulcer of unspecified site, stage 3: Secondary | ICD-10-CM | POA: Diagnosis not present

## 2012-08-27 DIAGNOSIS — G35 Multiple sclerosis: Secondary | ICD-10-CM | POA: Diagnosis not present

## 2012-08-27 DIAGNOSIS — L89309 Pressure ulcer of unspecified buttock, unspecified stage: Secondary | ICD-10-CM | POA: Diagnosis not present

## 2012-08-27 DIAGNOSIS — L0233 Carbuncle of buttock: Secondary | ICD-10-CM | POA: Diagnosis not present

## 2012-08-27 DIAGNOSIS — M549 Dorsalgia, unspecified: Secondary | ICD-10-CM | POA: Diagnosis not present

## 2012-08-27 DIAGNOSIS — I10 Essential (primary) hypertension: Secondary | ICD-10-CM | POA: Diagnosis not present

## 2012-08-30 DIAGNOSIS — M549 Dorsalgia, unspecified: Secondary | ICD-10-CM | POA: Diagnosis not present

## 2012-08-30 DIAGNOSIS — L89309 Pressure ulcer of unspecified buttock, unspecified stage: Secondary | ICD-10-CM | POA: Diagnosis not present

## 2012-08-30 DIAGNOSIS — I10 Essential (primary) hypertension: Secondary | ICD-10-CM | POA: Diagnosis not present

## 2012-08-30 DIAGNOSIS — L8993 Pressure ulcer of unspecified site, stage 3: Secondary | ICD-10-CM | POA: Diagnosis not present

## 2012-08-30 DIAGNOSIS — L0233 Carbuncle of buttock: Secondary | ICD-10-CM | POA: Diagnosis not present

## 2012-08-30 DIAGNOSIS — G35 Multiple sclerosis: Secondary | ICD-10-CM | POA: Diagnosis not present

## 2012-08-31 ENCOUNTER — Ambulatory Visit (INDEPENDENT_AMBULATORY_CARE_PROVIDER_SITE_OTHER): Payer: Medicare Other | Admitting: Urology

## 2012-08-31 DIAGNOSIS — N319 Neuromuscular dysfunction of bladder, unspecified: Secondary | ICD-10-CM | POA: Diagnosis not present

## 2012-08-31 DIAGNOSIS — N312 Flaccid neuropathic bladder, not elsewhere classified: Secondary | ICD-10-CM

## 2012-09-03 DIAGNOSIS — M549 Dorsalgia, unspecified: Secondary | ICD-10-CM | POA: Diagnosis not present

## 2012-09-03 DIAGNOSIS — L8993 Pressure ulcer of unspecified site, stage 3: Secondary | ICD-10-CM | POA: Diagnosis not present

## 2012-09-03 DIAGNOSIS — I10 Essential (primary) hypertension: Secondary | ICD-10-CM | POA: Diagnosis not present

## 2012-09-03 DIAGNOSIS — G35 Multiple sclerosis: Secondary | ICD-10-CM | POA: Diagnosis not present

## 2012-09-03 DIAGNOSIS — L89309 Pressure ulcer of unspecified buttock, unspecified stage: Secondary | ICD-10-CM | POA: Diagnosis not present

## 2012-09-03 DIAGNOSIS — L0233 Carbuncle of buttock: Secondary | ICD-10-CM | POA: Diagnosis not present

## 2012-09-06 DIAGNOSIS — G35 Multiple sclerosis: Secondary | ICD-10-CM | POA: Diagnosis not present

## 2012-09-06 DIAGNOSIS — M549 Dorsalgia, unspecified: Secondary | ICD-10-CM | POA: Diagnosis not present

## 2012-09-06 DIAGNOSIS — L0233 Carbuncle of buttock: Secondary | ICD-10-CM | POA: Diagnosis not present

## 2012-09-06 DIAGNOSIS — L8993 Pressure ulcer of unspecified site, stage 3: Secondary | ICD-10-CM | POA: Diagnosis not present

## 2012-09-06 DIAGNOSIS — L89309 Pressure ulcer of unspecified buttock, unspecified stage: Secondary | ICD-10-CM | POA: Diagnosis not present

## 2012-09-06 DIAGNOSIS — I10 Essential (primary) hypertension: Secondary | ICD-10-CM | POA: Diagnosis not present

## 2012-09-10 DIAGNOSIS — M549 Dorsalgia, unspecified: Secondary | ICD-10-CM | POA: Diagnosis not present

## 2012-09-10 DIAGNOSIS — I10 Essential (primary) hypertension: Secondary | ICD-10-CM | POA: Diagnosis not present

## 2012-09-10 DIAGNOSIS — L0233 Carbuncle of buttock: Secondary | ICD-10-CM | POA: Diagnosis not present

## 2012-09-10 DIAGNOSIS — G35 Multiple sclerosis: Secondary | ICD-10-CM | POA: Diagnosis not present

## 2012-09-10 DIAGNOSIS — L89309 Pressure ulcer of unspecified buttock, unspecified stage: Secondary | ICD-10-CM | POA: Diagnosis not present

## 2012-09-10 DIAGNOSIS — L8993 Pressure ulcer of unspecified site, stage 3: Secondary | ICD-10-CM | POA: Diagnosis not present

## 2012-09-16 DIAGNOSIS — M549 Dorsalgia, unspecified: Secondary | ICD-10-CM | POA: Diagnosis not present

## 2012-09-16 DIAGNOSIS — L89309 Pressure ulcer of unspecified buttock, unspecified stage: Secondary | ICD-10-CM | POA: Diagnosis not present

## 2012-09-16 DIAGNOSIS — L8993 Pressure ulcer of unspecified site, stage 3: Secondary | ICD-10-CM | POA: Diagnosis not present

## 2012-09-16 DIAGNOSIS — G35 Multiple sclerosis: Secondary | ICD-10-CM | POA: Diagnosis not present

## 2012-09-16 DIAGNOSIS — I10 Essential (primary) hypertension: Secondary | ICD-10-CM | POA: Diagnosis not present

## 2012-09-16 DIAGNOSIS — L0233 Carbuncle of buttock: Secondary | ICD-10-CM | POA: Diagnosis not present

## 2012-09-17 DIAGNOSIS — L8993 Pressure ulcer of unspecified site, stage 3: Secondary | ICD-10-CM | POA: Diagnosis not present

## 2012-09-17 DIAGNOSIS — L89309 Pressure ulcer of unspecified buttock, unspecified stage: Secondary | ICD-10-CM | POA: Diagnosis not present

## 2012-09-17 DIAGNOSIS — I10 Essential (primary) hypertension: Secondary | ICD-10-CM | POA: Diagnosis not present

## 2012-09-17 DIAGNOSIS — G35 Multiple sclerosis: Secondary | ICD-10-CM | POA: Diagnosis not present

## 2012-09-20 DIAGNOSIS — K5900 Constipation, unspecified: Secondary | ICD-10-CM | POA: Diagnosis not present

## 2012-09-20 DIAGNOSIS — I10 Essential (primary) hypertension: Secondary | ICD-10-CM | POA: Diagnosis not present

## 2012-09-20 DIAGNOSIS — L89309 Pressure ulcer of unspecified buttock, unspecified stage: Secondary | ICD-10-CM | POA: Diagnosis not present

## 2012-09-20 DIAGNOSIS — M549 Dorsalgia, unspecified: Secondary | ICD-10-CM | POA: Diagnosis not present

## 2012-09-20 DIAGNOSIS — Z9350 Unspecified cystostomy status: Secondary | ICD-10-CM | POA: Diagnosis not present

## 2012-09-20 DIAGNOSIS — L8993 Pressure ulcer of unspecified site, stage 3: Secondary | ICD-10-CM | POA: Diagnosis not present

## 2012-09-20 DIAGNOSIS — Z8673 Personal history of transient ischemic attack (TIA), and cerebral infarction without residual deficits: Secondary | ICD-10-CM | POA: Diagnosis not present

## 2012-09-20 DIAGNOSIS — G35 Multiple sclerosis: Secondary | ICD-10-CM | POA: Diagnosis not present

## 2012-09-23 DIAGNOSIS — G35 Multiple sclerosis: Secondary | ICD-10-CM | POA: Diagnosis not present

## 2012-09-23 DIAGNOSIS — L89309 Pressure ulcer of unspecified buttock, unspecified stage: Secondary | ICD-10-CM | POA: Diagnosis not present

## 2012-09-23 DIAGNOSIS — K5900 Constipation, unspecified: Secondary | ICD-10-CM | POA: Diagnosis not present

## 2012-09-23 DIAGNOSIS — M549 Dorsalgia, unspecified: Secondary | ICD-10-CM | POA: Diagnosis not present

## 2012-09-23 DIAGNOSIS — I10 Essential (primary) hypertension: Secondary | ICD-10-CM | POA: Diagnosis not present

## 2012-09-23 DIAGNOSIS — L8993 Pressure ulcer of unspecified site, stage 3: Secondary | ICD-10-CM | POA: Diagnosis not present

## 2012-09-28 DIAGNOSIS — M549 Dorsalgia, unspecified: Secondary | ICD-10-CM | POA: Diagnosis not present

## 2012-09-28 DIAGNOSIS — G35 Multiple sclerosis: Secondary | ICD-10-CM | POA: Diagnosis not present

## 2012-09-28 DIAGNOSIS — K5900 Constipation, unspecified: Secondary | ICD-10-CM | POA: Diagnosis not present

## 2012-09-28 DIAGNOSIS — L8993 Pressure ulcer of unspecified site, stage 3: Secondary | ICD-10-CM | POA: Diagnosis not present

## 2012-09-28 DIAGNOSIS — I10 Essential (primary) hypertension: Secondary | ICD-10-CM | POA: Diagnosis not present

## 2012-09-28 DIAGNOSIS — L89309 Pressure ulcer of unspecified buttock, unspecified stage: Secondary | ICD-10-CM | POA: Diagnosis not present

## 2012-10-01 DIAGNOSIS — L89309 Pressure ulcer of unspecified buttock, unspecified stage: Secondary | ICD-10-CM | POA: Diagnosis not present

## 2012-10-01 DIAGNOSIS — L8993 Pressure ulcer of unspecified site, stage 3: Secondary | ICD-10-CM | POA: Diagnosis not present

## 2012-10-01 DIAGNOSIS — G35 Multiple sclerosis: Secondary | ICD-10-CM | POA: Diagnosis not present

## 2012-10-01 DIAGNOSIS — I10 Essential (primary) hypertension: Secondary | ICD-10-CM | POA: Diagnosis not present

## 2012-10-01 DIAGNOSIS — M549 Dorsalgia, unspecified: Secondary | ICD-10-CM | POA: Diagnosis not present

## 2012-10-01 DIAGNOSIS — K5900 Constipation, unspecified: Secondary | ICD-10-CM | POA: Diagnosis not present

## 2012-10-04 DIAGNOSIS — G35 Multiple sclerosis: Secondary | ICD-10-CM | POA: Diagnosis not present

## 2012-10-04 DIAGNOSIS — I10 Essential (primary) hypertension: Secondary | ICD-10-CM | POA: Diagnosis not present

## 2012-10-04 DIAGNOSIS — L89309 Pressure ulcer of unspecified buttock, unspecified stage: Secondary | ICD-10-CM | POA: Diagnosis not present

## 2012-10-04 DIAGNOSIS — L8993 Pressure ulcer of unspecified site, stage 3: Secondary | ICD-10-CM | POA: Diagnosis not present

## 2012-10-05 DIAGNOSIS — R634 Abnormal weight loss: Secondary | ICD-10-CM | POA: Diagnosis not present

## 2012-10-05 DIAGNOSIS — L8990 Pressure ulcer of unspecified site, unspecified stage: Secondary | ICD-10-CM | POA: Diagnosis not present

## 2012-10-05 DIAGNOSIS — E039 Hypothyroidism, unspecified: Secondary | ICD-10-CM | POA: Diagnosis not present

## 2012-10-05 DIAGNOSIS — G35 Multiple sclerosis: Secondary | ICD-10-CM | POA: Diagnosis not present

## 2012-10-06 DIAGNOSIS — I10 Essential (primary) hypertension: Secondary | ICD-10-CM | POA: Diagnosis not present

## 2012-10-06 DIAGNOSIS — G35 Multiple sclerosis: Secondary | ICD-10-CM | POA: Diagnosis not present

## 2012-10-06 DIAGNOSIS — L8993 Pressure ulcer of unspecified site, stage 3: Secondary | ICD-10-CM | POA: Diagnosis not present

## 2012-10-06 DIAGNOSIS — M549 Dorsalgia, unspecified: Secondary | ICD-10-CM | POA: Diagnosis not present

## 2012-10-06 DIAGNOSIS — K5900 Constipation, unspecified: Secondary | ICD-10-CM | POA: Diagnosis not present

## 2012-10-06 DIAGNOSIS — L89309 Pressure ulcer of unspecified buttock, unspecified stage: Secondary | ICD-10-CM | POA: Diagnosis not present

## 2012-10-08 DIAGNOSIS — L8993 Pressure ulcer of unspecified site, stage 3: Secondary | ICD-10-CM | POA: Diagnosis not present

## 2012-10-08 DIAGNOSIS — G35 Multiple sclerosis: Secondary | ICD-10-CM | POA: Diagnosis not present

## 2012-10-08 DIAGNOSIS — L89309 Pressure ulcer of unspecified buttock, unspecified stage: Secondary | ICD-10-CM | POA: Diagnosis not present

## 2012-10-08 DIAGNOSIS — K5900 Constipation, unspecified: Secondary | ICD-10-CM | POA: Diagnosis not present

## 2012-10-08 DIAGNOSIS — M549 Dorsalgia, unspecified: Secondary | ICD-10-CM | POA: Diagnosis not present

## 2012-10-08 DIAGNOSIS — I10 Essential (primary) hypertension: Secondary | ICD-10-CM | POA: Diagnosis not present

## 2012-10-11 DIAGNOSIS — G35 Multiple sclerosis: Secondary | ICD-10-CM | POA: Diagnosis not present

## 2012-10-11 DIAGNOSIS — I10 Essential (primary) hypertension: Secondary | ICD-10-CM | POA: Diagnosis not present

## 2012-10-11 DIAGNOSIS — K5900 Constipation, unspecified: Secondary | ICD-10-CM | POA: Diagnosis not present

## 2012-10-11 DIAGNOSIS — L89309 Pressure ulcer of unspecified buttock, unspecified stage: Secondary | ICD-10-CM | POA: Diagnosis not present

## 2012-10-11 DIAGNOSIS — M549 Dorsalgia, unspecified: Secondary | ICD-10-CM | POA: Diagnosis not present

## 2012-10-11 DIAGNOSIS — L8993 Pressure ulcer of unspecified site, stage 3: Secondary | ICD-10-CM | POA: Diagnosis not present

## 2012-10-12 ENCOUNTER — Ambulatory Visit (INDEPENDENT_AMBULATORY_CARE_PROVIDER_SITE_OTHER): Payer: Medicare Other | Admitting: Urology

## 2012-10-12 DIAGNOSIS — N312 Flaccid neuropathic bladder, not elsewhere classified: Secondary | ICD-10-CM | POA: Diagnosis not present

## 2012-10-15 DIAGNOSIS — G35 Multiple sclerosis: Secondary | ICD-10-CM | POA: Diagnosis not present

## 2012-10-15 DIAGNOSIS — I10 Essential (primary) hypertension: Secondary | ICD-10-CM | POA: Diagnosis not present

## 2012-10-15 DIAGNOSIS — K5900 Constipation, unspecified: Secondary | ICD-10-CM | POA: Diagnosis not present

## 2012-10-15 DIAGNOSIS — L8993 Pressure ulcer of unspecified site, stage 3: Secondary | ICD-10-CM | POA: Diagnosis not present

## 2012-10-15 DIAGNOSIS — M549 Dorsalgia, unspecified: Secondary | ICD-10-CM | POA: Diagnosis not present

## 2012-10-15 DIAGNOSIS — L89309 Pressure ulcer of unspecified buttock, unspecified stage: Secondary | ICD-10-CM | POA: Diagnosis not present

## 2012-10-19 DIAGNOSIS — M549 Dorsalgia, unspecified: Secondary | ICD-10-CM | POA: Diagnosis not present

## 2012-10-19 DIAGNOSIS — L89309 Pressure ulcer of unspecified buttock, unspecified stage: Secondary | ICD-10-CM | POA: Diagnosis not present

## 2012-10-19 DIAGNOSIS — L8993 Pressure ulcer of unspecified site, stage 3: Secondary | ICD-10-CM | POA: Diagnosis not present

## 2012-10-19 DIAGNOSIS — G35 Multiple sclerosis: Secondary | ICD-10-CM | POA: Diagnosis not present

## 2012-10-19 DIAGNOSIS — I10 Essential (primary) hypertension: Secondary | ICD-10-CM | POA: Diagnosis not present

## 2012-10-19 DIAGNOSIS — K5900 Constipation, unspecified: Secondary | ICD-10-CM | POA: Diagnosis not present

## 2012-10-22 DIAGNOSIS — K5900 Constipation, unspecified: Secondary | ICD-10-CM | POA: Diagnosis not present

## 2012-10-22 DIAGNOSIS — L8993 Pressure ulcer of unspecified site, stage 3: Secondary | ICD-10-CM | POA: Diagnosis not present

## 2012-10-22 DIAGNOSIS — M549 Dorsalgia, unspecified: Secondary | ICD-10-CM | POA: Diagnosis not present

## 2012-10-22 DIAGNOSIS — G35 Multiple sclerosis: Secondary | ICD-10-CM | POA: Diagnosis not present

## 2012-10-22 DIAGNOSIS — L89309 Pressure ulcer of unspecified buttock, unspecified stage: Secondary | ICD-10-CM | POA: Diagnosis not present

## 2012-10-22 DIAGNOSIS — I10 Essential (primary) hypertension: Secondary | ICD-10-CM | POA: Diagnosis not present

## 2012-10-27 DIAGNOSIS — L89309 Pressure ulcer of unspecified buttock, unspecified stage: Secondary | ICD-10-CM | POA: Diagnosis not present

## 2012-10-27 DIAGNOSIS — K5900 Constipation, unspecified: Secondary | ICD-10-CM | POA: Diagnosis not present

## 2012-10-27 DIAGNOSIS — L8993 Pressure ulcer of unspecified site, stage 3: Secondary | ICD-10-CM | POA: Diagnosis not present

## 2012-10-27 DIAGNOSIS — I10 Essential (primary) hypertension: Secondary | ICD-10-CM | POA: Diagnosis not present

## 2012-10-27 DIAGNOSIS — M549 Dorsalgia, unspecified: Secondary | ICD-10-CM | POA: Diagnosis not present

## 2012-10-27 DIAGNOSIS — G35 Multiple sclerosis: Secondary | ICD-10-CM | POA: Diagnosis not present

## 2012-10-28 ENCOUNTER — Encounter: Payer: Self-pay | Admitting: Internal Medicine

## 2012-11-19 ENCOUNTER — Ambulatory Visit (INDEPENDENT_AMBULATORY_CARE_PROVIDER_SITE_OTHER): Payer: Medicare Other | Admitting: Urology

## 2012-11-19 DIAGNOSIS — N319 Neuromuscular dysfunction of bladder, unspecified: Secondary | ICD-10-CM

## 2012-11-22 ENCOUNTER — Ambulatory Visit (INDEPENDENT_AMBULATORY_CARE_PROVIDER_SITE_OTHER): Payer: Medicare Other | Admitting: Urgent Care

## 2012-11-22 ENCOUNTER — Encounter: Payer: Self-pay | Admitting: Urgent Care

## 2012-11-22 VITALS — BP 136/71 | HR 71 | Temp 98.1°F | Ht 66.0 in | Wt 134.6 lb

## 2012-11-22 DIAGNOSIS — D126 Benign neoplasm of colon, unspecified: Secondary | ICD-10-CM

## 2012-11-22 DIAGNOSIS — R197 Diarrhea, unspecified: Secondary | ICD-10-CM | POA: Diagnosis not present

## 2012-11-22 DIAGNOSIS — K529 Noninfective gastroenteritis and colitis, unspecified: Secondary | ICD-10-CM

## 2012-11-22 MED ORDER — CHOLESTYRAMINE 4 G PO PACK: 1.0000 | PACK | Freq: Every day | ORAL | Status: AC

## 2012-11-22 NOTE — Assessment & Plan Note (Addendum)
Jordan Ward is a pleasant 72 y.o. male with chronic diarrhea secondary to foreshortening of his colon due to segmental resection secondary to large adenomatous polyp with high-grade dysplasia.  He is doing well on Questran 4 g daily.  He may need to titrate with constipation to half dose or hold the dose.   Continue Questran 4 g daily as needed for diarrhea. Do not take within 2 hours of your other medications. Hold dose or half dose if you develop constipation.

## 2012-11-22 NOTE — Progress Notes (Signed)
Primary Care Physician:  Collene Mares, PA Primary Gastroenterologist:  Dr. Gala Romney  Chief Complaint  Patient presents with  . Colonoscopy    HPI:  Jordan Ward is a 72 y.o. male here to set up colonoscopy.  He has history of multiple medical problems including MS, peripheral vascular disease, and pulmonary fibrosis.  He is status post left hemicolectomy for large adenomatous splenic flexure polyp with high grade dysplasia in January 2007. He had multiple other polyps as well.  Last colonoscopy was 11/18/2007 by Dr. Gala Romney. It showed distal mamillations and multiple biopsies were benign.   He knows he's been having problems with wounds. He has "ulcers" on his buttocks and has been followed by the wound center for the past 1 year.  He also had a suprapubic catheter placed July 2013.  He has history of chronic diarrhea secondary to foreshortening this colon. He takes Questran with good results. He has occasional constipation on questran 4 grams daily for diarrhea.  Denies rectal bleeding or melena.  Hx hemorrhoids.  He reports he does not want to have colonoscopy at this time due to recent wounds.  He feels the prep would be difficult to keep wounds clean.  Denies heartburn, indigestion, nausea, vomiting, dysphagia, odynophagia or anorexia.  Past Medical History  Diagnosis Date  . Arthritis   . MS (multiple sclerosis)   . Insomnia   . PVD (peripheral vascular disease)   . Carotid artery stenosis   . TIA (transient ischemic attack)   . Chronic indwelling foley catheter 10/06/2011  . Pulmonary fibrosis 10/06/2011  . Dysphagia 10/07/2011  . Tremors of nervous system 10/08/2011  . Hypothyroidism 10/08/2011  . Pulmonary nodule 10/08/2011  . C. difficile colitis 09/2011  . Pneumonia   . Encephalopathy   . Urinary tract infection   . Tremors of nervous system   . Hypokalemia   . Junctional rhythm   . Anemia   . Hypernatremia   . Sacral ulcer   . HTN (hypertension), malignant 10/06/2011  . Sleep apnea   .  Neuromuscular disorder   . Back pain, chronic   . Kidney stones   . High grade dysplasia in colonic adenoma 09/2005    Past Surgical History  Procedure Laterality Date  . Inguinal hernia repair  1971    bilateral  . Back surgery  1976/1979  . Colonoscopy  11/2004    Dr. Sharol Roussel sessile polyp splenic flexure, 51mm sessile polyp desc colon, tubulovillous adenoma (bx not removed)  . Colonoscopy  01/2005    poor prep, polyp could not be found  . Colonoscopy  05/2005    with EMR, polypectomy Dr. Olegario Messier, bx showed high grade dysplasia, partially resected  . Colonoscopy  09/2005    Dr. Arsenio Loader, [place] ink tattooing, four villous colon polyp (3 had been missed on previous colonoscopies due to limitations of procedures  . Colon surgery  09/2005    Fleishman: four tubular adenomas, large adenomatous polyp with HIGH GRADE dysplasia  . Appendectomy  09/2005    at time of left hemicolectomy  . Colonoscopy  09/2006    normal TI, no polyps  . Colonoscopy  10/2007    Dr. Imogene Burn distal mammillations, benign bx, normal TI, random bx neg for microscopic colitis  . Cholecystectomy      Dr. Tamala Julian  . Suprapubic catheter insertion      Current Outpatient Prescriptions  Medication Sig Dispense Refill  . cholestyramine (QUESTRAN) 4 G packet Take 1 packet by mouth daily.  30 each  5  . diazepam (VALIUM) 5 MG tablet Take 1 tablet (5 mg total) by mouth 2 (two) times daily.  30 tablet  0  . hydrocodone-acetaminophen (LORCET PLUS) 7.5-650 MG per tablet Take 1 tablet by mouth every 6 (six) hours as needed. pain  30 tablet  0  . levothyroxine (SYNTHROID, LEVOTHROID) 50 MCG tablet Take 50 mcg by mouth daily.      . potassium chloride SA (K-DUR,KLOR-CON) 20 MEQ tablet Take 20 mEq by mouth daily.      . verapamil (CALAN) 40 MG tablet Take 40 mg by mouth Twice daily.      Marland Kitchen albuterol-ipratropium (COMBIVENT) 18-103 MCG/ACT inhaler Inhale 2 puffs into the lungs 3 (three) times daily.  1 Inhaler  1    No current facility-administered medications for this visit.    Allergies as of 11/22/2012 - Review Complete 11/22/2012  Allergen Reaction Noted  . Tetracyclines & related Anaphylaxis and Rash 06/17/2011    Family History  Problem Relation Age of Onset  . Colon cancer Neg Hx   . Cirrhosis Brother     etoh  . Stroke Mother 20  . Coronary artery disease Father 27  . Heart attack Brother     History   Social History  . Marital Status: Married    Spouse Name: N/A    Number of Children: 2  . Years of Education: N/A   Occupational History  . disability   .     Social History Main Topics  . Smoking status: Former Smoker    Types: Cigarettes  . Smokeless tobacco: Never Used     Comment: quit remote  . Alcohol Use: No  . Drug Use: No  . Sexually Active: Not on file   Other Topics Concern  . Not on file   Social History Narrative   Lives w/ wife    Review of Systems: Gen: + Malaise, weakness. Denies fever or chills. CV: Denies chest pain, angina, palpitations, syncope, orthopnea, PND, peripheral edema, and claudication. Resp: Denies dyspnea at rest, dyspnea with exercise, cough, sputum, wheezing, coughing up blood, and pleurisy. GI: See history of present illness GU : Denies urinary burning, blood in urine, urinary frequency, urinary hesitancy, nocturnal urination, and urinary incontinence. MS: Occasional low back and joint pain. He ambulates with a walker. He does have stiffness. History of MS.  Derm: Denies rash, itching, dry skin, hives, moles, warts, or unhealing ulcers.  Psych: Denies depression, anxiety, memory loss, suicidal ideation, hallucinations, paranoia, and confusion. Heme: Denies bruising, bleeding, and enlarged lymph nodes. Neuro:  Denies any headaches, dizziness, paresthesias. Endo:  Denies any problems with DM, thyroid, adrenal function.  Physical Exam: BP 136/71  Pulse 71  Temp(Src) 98.1 F (36.7 C) (Oral)  Ht 5\' 6"  (1.676 m)  Wt 134 lb  9.6 oz (61.054 kg)  BMI 21.74 kg/m2 No LMP for male patient. General:   Alert,  Well-developed, thin, pleasant and cooperative in NAD. Accompanied by his wife today. He ambulates with a walker. Head:  Normocephalic and atraumatic. Eyes:  Sclera clear, no icterus.   Conjunctiva pink. Ears:  Normal auditory acuity. Nose:  No deformity, discharge, or lesions. Mouth:  No deformity or lesions,oropharynx pink & moist. Neck:  Supple; no masses or thyromegaly. Lungs:  Clear throughout to auscultation.   No wheezes, crackles, or rhonchi. No acute distress. Heart:  Regular rate and rhythm; no murmurs, clicks, rubs,  or gallops. Abdomen:  Normal bowel sounds.  No bruits.  Soft,  non-tender and non-distended without masses, hepatosplenomegaly or hernias noted.  No guarding or rebound tenderness.   Rectal:  Internal exam Deferred. External exam reveals erythema left sacrum. Healing 1 cm scab with surrounding erythema & indentation right sacrum. Msk:  Symmetrical, minimal hand/arm contractures bilaterally. Pulses:  Normal pulses noted. Extremities:  + clubbing.  No edema. Neurologic:  Alert and  oriented x4;  grossly normal neurologically. Skin:  Intact without significant lesions or rashes. Lymph Nodes:  No significant cervical adenopathy. Psych:  Alert and cooperative. Normal mood and affect.

## 2012-11-22 NOTE — Patient Instructions (Addendum)
Office visit end of July 2014 to set up an appointment for colonoscopy with Dr. Gala Romney Continue Questran 4 g daily as needed for diarrhea. Do not take within 2 hours of your other medications. Hold dose or half dose if you develop constipation.

## 2012-11-22 NOTE — Assessment & Plan Note (Signed)
He is due for colonoscopy at this time, however given his recent sacral decubitus, he is wanting to postpone colonoscopy until late summer.  He has just completed several months of wound care and does not want to risk infection or discomfort secondary to prep with colonoscopy at this time.  Followup with wound care clinic as needed Office visit end of July 2014 to set up an appointment for colonoscopy with Dr. Gala Romney

## 2012-11-23 NOTE — Progress Notes (Signed)
Faxed to PCP

## 2012-12-17 ENCOUNTER — Ambulatory Visit (INDEPENDENT_AMBULATORY_CARE_PROVIDER_SITE_OTHER): Payer: Medicare Other | Admitting: Urology

## 2012-12-17 DIAGNOSIS — R339 Retention of urine, unspecified: Secondary | ICD-10-CM | POA: Diagnosis not present

## 2013-01-13 DIAGNOSIS — Z713 Dietary counseling and surveillance: Secondary | ICD-10-CM | POA: Diagnosis not present

## 2013-01-13 DIAGNOSIS — Z23 Encounter for immunization: Secondary | ICD-10-CM | POA: Diagnosis not present

## 2013-01-13 DIAGNOSIS — IMO0002 Reserved for concepts with insufficient information to code with codable children: Secondary | ICD-10-CM | POA: Diagnosis not present

## 2013-01-13 DIAGNOSIS — T148XXA Other injury of unspecified body region, initial encounter: Secondary | ICD-10-CM | POA: Diagnosis not present

## 2013-01-13 DIAGNOSIS — Z7182 Exercise counseling: Secondary | ICD-10-CM | POA: Diagnosis not present

## 2013-01-21 ENCOUNTER — Ambulatory Visit: Payer: Medicare Other | Admitting: Urology

## 2013-01-26 ENCOUNTER — Encounter (HOSPITAL_COMMUNITY): Payer: Self-pay | Admitting: Emergency Medicine

## 2013-01-26 ENCOUNTER — Emergency Department (HOSPITAL_COMMUNITY)
Admission: EM | Admit: 2013-01-26 | Discharge: 2013-01-27 | Disposition: A | Discharge status: Home / Self Care | Payer: Medicare Other | Attending: Emergency Medicine | Admitting: Emergency Medicine

## 2013-01-26 DIAGNOSIS — Y846 Urinary catheterization as the cause of abnormal reaction of the patient, or of later complication, without mention of misadventure at the time of the procedure: Secondary | ICD-10-CM | POA: Insufficient documentation

## 2013-01-26 DIAGNOSIS — Z8669 Personal history of other diseases of the nervous system and sense organs: Secondary | ICD-10-CM | POA: Insufficient documentation

## 2013-01-26 DIAGNOSIS — Z8739 Personal history of other diseases of the musculoskeletal system and connective tissue: Secondary | ICD-10-CM | POA: Diagnosis not present

## 2013-01-26 DIAGNOSIS — Z8619 Personal history of other infectious and parasitic diseases: Secondary | ICD-10-CM | POA: Diagnosis not present

## 2013-01-26 DIAGNOSIS — Z8709 Personal history of other diseases of the respiratory system: Secondary | ICD-10-CM | POA: Diagnosis not present

## 2013-01-26 DIAGNOSIS — Z85038 Personal history of other malignant neoplasm of large intestine: Secondary | ICD-10-CM | POA: Insufficient documentation

## 2013-01-26 DIAGNOSIS — Z87440 Personal history of urinary (tract) infections: Secondary | ICD-10-CM | POA: Insufficient documentation

## 2013-01-26 DIAGNOSIS — Z8679 Personal history of other diseases of the circulatory system: Secondary | ICD-10-CM | POA: Insufficient documentation

## 2013-01-26 DIAGNOSIS — Z87891 Personal history of nicotine dependence: Secondary | ICD-10-CM | POA: Diagnosis not present

## 2013-01-26 DIAGNOSIS — Z8701 Personal history of pneumonia (recurrent): Secondary | ICD-10-CM | POA: Diagnosis not present

## 2013-01-26 DIAGNOSIS — I10 Essential (primary) hypertension: Secondary | ICD-10-CM | POA: Insufficient documentation

## 2013-01-26 DIAGNOSIS — T839XXA Unspecified complication of genitourinary prosthetic device, implant and graft, initial encounter: Secondary | ICD-10-CM

## 2013-01-26 DIAGNOSIS — E039 Hypothyroidism, unspecified: Secondary | ICD-10-CM | POA: Diagnosis not present

## 2013-01-26 DIAGNOSIS — Z87442 Personal history of urinary calculi: Secondary | ICD-10-CM | POA: Diagnosis not present

## 2013-01-26 DIAGNOSIS — Z8673 Personal history of transient ischemic attack (TIA), and cerebral infarction without residual deficits: Secondary | ICD-10-CM | POA: Diagnosis not present

## 2013-01-26 DIAGNOSIS — I739 Peripheral vascular disease, unspecified: Secondary | ICD-10-CM | POA: Diagnosis not present

## 2013-01-26 DIAGNOSIS — G8929 Other chronic pain: Secondary | ICD-10-CM | POA: Diagnosis not present

## 2013-01-26 DIAGNOSIS — Z8719 Personal history of other diseases of the digestive system: Secondary | ICD-10-CM | POA: Insufficient documentation

## 2013-01-26 DIAGNOSIS — R339 Retention of urine, unspecified: Secondary | ICD-10-CM | POA: Diagnosis not present

## 2013-01-26 DIAGNOSIS — Z79899 Other long term (current) drug therapy: Secondary | ICD-10-CM | POA: Insufficient documentation

## 2013-01-26 DIAGNOSIS — Z872 Personal history of diseases of the skin and subcutaneous tissue: Secondary | ICD-10-CM | POA: Insufficient documentation

## 2013-01-26 DIAGNOSIS — Z8639 Personal history of other endocrine, nutritional and metabolic disease: Secondary | ICD-10-CM | POA: Insufficient documentation

## 2013-01-26 DIAGNOSIS — T83091A Other mechanical complication of indwelling urethral catheter, initial encounter: Secondary | ICD-10-CM | POA: Diagnosis not present

## 2013-01-26 DIAGNOSIS — Z862 Personal history of diseases of the blood and blood-forming organs and certain disorders involving the immune mechanism: Secondary | ICD-10-CM | POA: Insufficient documentation

## 2013-01-26 NOTE — ED Notes (Signed)
Per Dr. Lacinda Axon, this RN irrigated suprapubic catheter with 5cc sterile water. No immediate return of urine or fluid.

## 2013-01-26 NOTE — ED Provider Notes (Signed)
History  This chart was scribed for [employer]. Olin Hauser, MD by Jenne Campus, ED Scribe. This patient was seen in room APA01/APA01 and the patient's care was started at 11:23 PM.  CSN: [id number]  Arrival date & time 01/26/13  2208   First MD Initiated Contact with Patient 01/26/13 2311      Chief Complaint  Patient presents with  . Urinary Retention     The history is provided by the patient and the spouse. No language interpreter was used.     HPI Comments: Jordan Ward is a 72 y.o. male who presents to the Emergency Department complaining of gradual onset, gradually worsening, constant urinary retention after his foley catheter stopped passing urine 4 to 5 hours ago. The catheter was placed one year ago due to decreased kidney function from MS. No treatments were done at home. Wife reports that the pt has an appointment with urology to have it replaced in 2 days. Pt denies fevers, chills, nausea and emesis as associated symptoms.   Urologist is Dr. Otelia Sergeant  Past Medical History  Diagnosis Date  . Arthritis   . MS (multiple sclerosis)   . Insomnia   . PVD (peripheral vascular disease)   . Carotid artery stenosis   . TIA (transient ischemic attack)   . Chronic indwelling foley catheter 10/06/2011  . Pulmonary fibrosis 10/06/2011  . Dysphagia 10/07/2011  . Tremors of nervous system 10/08/2011  . Hypothyroidism 10/08/2011  . Pulmonary nodule 10/08/2011  . C. difficile colitis 09/2011  . Pneumonia   . Encephalopathy   . Urinary tract infection   . Tremors of nervous system   . Hypokalemia   . Junctional rhythm   . Anemia   . Hypernatremia   . Sacral ulcer   . HTN (hypertension), malignant 10/06/2011  . Sleep apnea   . Neuromuscular disorder   . Back pain, chronic   . Kidney stones   . High grade dysplasia in colonic adenoma 09/2005    Past Surgical History  Procedure Laterality Date  . Inguinal hernia repair  1971    bilateral  . Back surgery  1976/1979  . Colonoscopy   11/2004    Dr. Sharol Roussel sessile polyp splenic flexure, 45mm sessile polyp desc colon, tubulovillous adenoma (bx not removed)  . Colonoscopy  01/2005    poor prep, polyp could not be found  . Colonoscopy  05/2005    with EMR, polypectomy Dr. Olegario Messier, bx showed high grade dysplasia, partially resected  . Colonoscopy  09/2005    Dr. Arsenio Loader, [place] ink tattooing, four villous colon polyp (3 had been missed on previous colonoscopies due to limitations of procedures  . Colon surgery  09/2005    Fleishman: four tubular adenomas, large adenomatous polyp with HIGH GRADE dysplasia  . Appendectomy  09/2005    at time of left hemicolectomy  . Colonoscopy  09/2006    normal TI, no polyps  . Colonoscopy  10/2007    Dr. Imogene Burn distal mammillations, benign bx, normal TI, random bx neg for microscopic colitis  . Cholecystectomy      Dr. Tamala Julian  . Suprapubic catheter insertion      Family History  Problem Relation Age of Onset  . Colon cancer Neg Hx   . Cirrhosis Brother     etoh  . Stroke Mother 43  . Coronary artery disease Father 34  . Heart attack Brother     History  Substance Use Topics  . Smoking status: Former Smoker  Types: Cigarettes  . Smokeless tobacco: Never Used     Comment: quit remote  . Alcohol Use: No      Review of Systems  Constitutional: Negative for fever.       10 systems reviewed and are negative for acute change except as noted in the HPI  HENT: Negative for congestion.   Eyes: Negative for discharge and redness.  Respiratory: Negative for cough and shortness of breath.   Cardiovascular: Negative for chest pain.  Gastrointestinal: Negative for vomiting and abdominal pain.  Musculoskeletal: Negative for back pain.  Skin: Negative for rash.  Neurological: Negative for syncope, numbness and headaches.  Psychiatric/Behavioral:       No behavior change    Allergies  Tetracyclines & related  Home Medications   Current Outpatient Rx  Name   Route  Sig  Dispense  Refill  . cholestyramine (QUESTRAN) 4 G packet   Oral   Take 1 packet by mouth daily.   30 each   5   . diazepam (VALIUM) 5 MG tablet   Oral   Take 1 tablet (5 mg total) by mouth 2 (two) times daily.   30 tablet   0   . hydrocodone-acetaminophen (LORCET PLUS) 7.5-650 MG per tablet   Oral   Take 1 tablet by mouth every 6 (six) hours as needed. pain   30 tablet   0   . levothyroxine (SYNTHROID, LEVOTHROID) 50 MCG tablet   Oral   Take 50 mcg by mouth daily.         . potassium chloride SA (K-DUR,KLOR-CON) 20 MEQ tablet   Oral   Take 20 mEq by mouth daily.         . verapamil (CALAN) 40 MG tablet   Oral   Take 40 mg by mouth Twice daily.           Triage Vitals: BP 100/63  Pulse 81  Temp(Src) 98.7 F (37.1 C) (Oral)  Resp 16  Ht 5\' 6"  (1.676 m)  Wt 134 lb (60.782 kg)  BMI 21.64 kg/m2  SpO2 96%  Physical Exam  Nursing note and vitals reviewed. Constitutional: He is oriented to person, place, and time. He appears well-developed and well-nourished. No distress.  HENT:  Head: Normocephalic and atraumatic.  Eyes: EOM are normal.  Neck: Neck supple. No tracheal deviation present.  Cardiovascular: Normal rate and regular rhythm.   No murmur heard. Pulmonary/Chest: Effort normal and breath sounds normal. No respiratory distress.  Abdominal: Soft. There is no tenderness.  Blocked suprapubic catheter  Musculoskeletal: Normal range of motion.  Neurological: He is alert and oriented to person, place, and time.  Skin: Skin is warm and dry.  Psychiatric: He has a normal mood and affect. His behavior is normal.    ED Course  Procedures (including critical care time)  DIAGNOSTIC STUDIES: Oxygen Saturation is 96% on room air, normal by my interpretation.    COORDINATION OF CARE: 11:26 PM-Pt reports that the flushing attempt in the ED was not successful. Discussed treatment plan which includes foley in the penis with pt at bedside and pt  agreed to plan. Advised pt to f/u with urology as scheduled. 0005 Catheterr has begun to drain. 100 cc dark urine in the bag. Will leave the catheter until Friday when the patient sees his urologist.   MDM  Patient with a suprapubic catheter who did not have any drainage for several hours tonight. Once here the catheter was flushed initially with no success and  began to drain on its own several minutes later. The catheter will be left alone until he sees his urologist on Friday. Pt stable in ED with no significant deterioration in condition.The patient appears reasonably screened and/or stabilized for discharge and I doubt any other medical condition or other [facility] requiring further screening, evaluation, or treatment in the ED at this time prior to discharge.  I personally performed the services described in this documentation, which was scribed in my presence. The recorded information has been reviewed and considered.   MDM Reviewed: nursing note and vitals           Gypsy Balsam. Olin Hauser, MD 01/27/13 [id number]

## 2013-01-26 NOTE — ED Notes (Signed)
This RN went to place foley catheter but found a return of 100cc of urine in suprapubic catheter leg bag. Dr. Olin Hauser notified and foley catheter order discontinued.

## 2013-01-26 NOTE — ED Notes (Signed)
Pt states his foley catheter has been stopped up 4-5 hours.

## 2013-01-28 ENCOUNTER — Ambulatory Visit (INDEPENDENT_AMBULATORY_CARE_PROVIDER_SITE_OTHER): Payer: Medicare Other | Admitting: Urology

## 2013-01-28 DIAGNOSIS — R338 Other retention of urine: Secondary | ICD-10-CM | POA: Diagnosis not present

## 2013-02-09 ENCOUNTER — Other Ambulatory Visit: Payer: Self-pay | Admitting: Neurology

## 2013-02-23 DIAGNOSIS — E876 Hypokalemia: Secondary | ICD-10-CM | POA: Diagnosis not present

## 2013-02-23 DIAGNOSIS — J84112 Idiopathic pulmonary fibrosis: Secondary | ICD-10-CM | POA: Diagnosis not present

## 2013-02-23 DIAGNOSIS — J449 Chronic obstructive pulmonary disease, unspecified: Secondary | ICD-10-CM | POA: Diagnosis not present

## 2013-02-23 DIAGNOSIS — G35 Multiple sclerosis: Secondary | ICD-10-CM | POA: Diagnosis not present

## 2013-02-23 DIAGNOSIS — Z79899 Other long term (current) drug therapy: Secondary | ICD-10-CM | POA: Diagnosis not present

## 2013-03-01 ENCOUNTER — Ambulatory Visit (INDEPENDENT_AMBULATORY_CARE_PROVIDER_SITE_OTHER): Payer: Medicare Other | Admitting: Urology

## 2013-03-01 DIAGNOSIS — R339 Retention of urine, unspecified: Secondary | ICD-10-CM | POA: Diagnosis not present

## 2013-03-22 ENCOUNTER — Ambulatory Visit (INDEPENDENT_AMBULATORY_CARE_PROVIDER_SITE_OTHER): Payer: Medicare Other | Admitting: Urology

## 2013-03-22 DIAGNOSIS — N319 Neuromuscular dysfunction of bladder, unspecified: Secondary | ICD-10-CM | POA: Diagnosis not present

## 2013-04-05 ENCOUNTER — Ambulatory Visit (INDEPENDENT_AMBULATORY_CARE_PROVIDER_SITE_OTHER): Payer: Medicare Other | Admitting: Urology

## 2013-04-05 DIAGNOSIS — R339 Retention of urine, unspecified: Secondary | ICD-10-CM | POA: Diagnosis not present

## 2013-04-11 ENCOUNTER — Other Ambulatory Visit: Payer: Self-pay | Admitting: Neurology

## 2013-05-03 ENCOUNTER — Ambulatory Visit (INDEPENDENT_AMBULATORY_CARE_PROVIDER_SITE_OTHER): Payer: Medicare Other | Admitting: Urology

## 2013-05-03 DIAGNOSIS — R339 Retention of urine, unspecified: Secondary | ICD-10-CM | POA: Diagnosis not present

## 2013-05-31 ENCOUNTER — Ambulatory Visit (INDEPENDENT_AMBULATORY_CARE_PROVIDER_SITE_OTHER): Payer: Medicare Other | Admitting: Urology

## 2013-05-31 DIAGNOSIS — R339 Retention of urine, unspecified: Secondary | ICD-10-CM | POA: Diagnosis not present

## 2013-06-13 ENCOUNTER — Other Ambulatory Visit: Payer: Self-pay | Admitting: Neurology

## 2013-06-13 NOTE — Telephone Encounter (Signed)
Dr Jannifer Franklin is out of the office, forwarding request to Dr Duncan Dull.

## 2013-06-14 NOTE — Telephone Encounter (Signed)
Rx signed and faxed.

## 2013-06-28 ENCOUNTER — Ambulatory Visit (INDEPENDENT_AMBULATORY_CARE_PROVIDER_SITE_OTHER): Payer: Medicare Other | Admitting: Urology

## 2013-06-28 DIAGNOSIS — R339 Retention of urine, unspecified: Secondary | ICD-10-CM

## 2013-07-13 ENCOUNTER — Other Ambulatory Visit: Payer: Self-pay | Admitting: Neurology

## 2013-07-13 NOTE — Telephone Encounter (Signed)
Rx signed and faxed.

## 2013-07-29 ENCOUNTER — Ambulatory Visit (INDEPENDENT_AMBULATORY_CARE_PROVIDER_SITE_OTHER): Payer: Medicare Other | Admitting: Urology

## 2013-07-29 DIAGNOSIS — R339 Retention of urine, unspecified: Secondary | ICD-10-CM

## 2013-08-10 ENCOUNTER — Other Ambulatory Visit: Payer: Self-pay | Admitting: Neurology

## 2013-08-11 NOTE — Telephone Encounter (Signed)
Pt's prescription was faxed over [facility] at [phone].

## 2013-08-30 ENCOUNTER — Encounter (INDEPENDENT_AMBULATORY_CARE_PROVIDER_SITE_OTHER): Payer: Self-pay

## 2013-08-30 ENCOUNTER — Ambulatory Visit (INDEPENDENT_AMBULATORY_CARE_PROVIDER_SITE_OTHER): Payer: Medicare Other | Admitting: Urology

## 2013-08-30 DIAGNOSIS — R339 Retention of urine, unspecified: Secondary | ICD-10-CM | POA: Diagnosis not present

## 2013-09-30 ENCOUNTER — Ambulatory Visit (INDEPENDENT_AMBULATORY_CARE_PROVIDER_SITE_OTHER): Payer: Medicare Other | Admitting: Urology

## 2013-09-30 ENCOUNTER — Encounter (INDEPENDENT_AMBULATORY_CARE_PROVIDER_SITE_OTHER): Payer: Self-pay

## 2013-09-30 DIAGNOSIS — R339 Retention of urine, unspecified: Secondary | ICD-10-CM

## 2013-10-05 DIAGNOSIS — J84112 Idiopathic pulmonary fibrosis: Secondary | ICD-10-CM | POA: Diagnosis not present

## 2013-10-05 DIAGNOSIS — M545 Low back pain, unspecified: Secondary | ICD-10-CM | POA: Diagnosis not present

## 2013-10-05 DIAGNOSIS — E039 Hypothyroidism, unspecified: Secondary | ICD-10-CM | POA: Diagnosis not present

## 2013-10-05 DIAGNOSIS — G35 Multiple sclerosis: Secondary | ICD-10-CM | POA: Diagnosis not present

## 2013-10-05 DIAGNOSIS — Z79899 Other long term (current) drug therapy: Secondary | ICD-10-CM | POA: Diagnosis not present

## 2013-10-05 DIAGNOSIS — Z5181 Encounter for therapeutic drug level monitoring: Secondary | ICD-10-CM | POA: Diagnosis not present

## 2013-10-07 ENCOUNTER — Ambulatory Visit (INDEPENDENT_AMBULATORY_CARE_PROVIDER_SITE_OTHER): Payer: Medicare Other | Admitting: Urology

## 2013-10-07 DIAGNOSIS — R339 Retention of urine, unspecified: Secondary | ICD-10-CM | POA: Diagnosis not present

## 2013-11-01 ENCOUNTER — Ambulatory Visit (INDEPENDENT_AMBULATORY_CARE_PROVIDER_SITE_OTHER): Payer: Medicare Other | Admitting: Urology

## 2013-11-01 DIAGNOSIS — R339 Retention of urine, unspecified: Secondary | ICD-10-CM

## 2013-11-21 DIAGNOSIS — R3989 Other symptoms and signs involving the genitourinary system: Secondary | ICD-10-CM | POA: Diagnosis not present

## 2013-11-21 DIAGNOSIS — I10 Essential (primary) hypertension: Secondary | ICD-10-CM | POA: Diagnosis not present

## 2013-11-21 DIAGNOSIS — G35 Multiple sclerosis: Secondary | ICD-10-CM | POA: Diagnosis not present

## 2013-11-21 DIAGNOSIS — IMO0002 Reserved for concepts with insufficient information to code with codable children: Secondary | ICD-10-CM | POA: Diagnosis not present

## 2013-11-29 ENCOUNTER — Ambulatory Visit (INDEPENDENT_AMBULATORY_CARE_PROVIDER_SITE_OTHER): Payer: Medicare Other | Admitting: Urology

## 2013-11-29 DIAGNOSIS — R339 Retention of urine, unspecified: Secondary | ICD-10-CM | POA: Diagnosis not present

## 2013-12-06 ENCOUNTER — Ambulatory Visit (INDEPENDENT_AMBULATORY_CARE_PROVIDER_SITE_OTHER): Payer: Medicare Other | Admitting: Urology

## 2013-12-06 DIAGNOSIS — R339 Retention of urine, unspecified: Secondary | ICD-10-CM | POA: Diagnosis not present

## 2013-12-27 ENCOUNTER — Ambulatory Visit (INDEPENDENT_AMBULATORY_CARE_PROVIDER_SITE_OTHER): Payer: Medicare Other | Admitting: Urology

## 2013-12-27 DIAGNOSIS — R339 Retention of urine, unspecified: Secondary | ICD-10-CM

## 2014-01-24 ENCOUNTER — Ambulatory Visit (INDEPENDENT_AMBULATORY_CARE_PROVIDER_SITE_OTHER): Payer: Medicare Other | Admitting: Urology

## 2014-01-24 DIAGNOSIS — R339 Retention of urine, unspecified: Secondary | ICD-10-CM | POA: Diagnosis not present

## 2014-02-07 ENCOUNTER — Other Ambulatory Visit: Payer: Self-pay | Admitting: Neurology

## 2014-02-07 NOTE — Telephone Encounter (Signed)
Patient has not been seen in 2 years.  Last OV May 2013.  I called the patient, got no answer.  No option to leave message.  Recording asked for the remote access code.

## 2014-02-07 NOTE — Telephone Encounter (Signed)
Called again, got no answer, no VM.

## 2014-02-08 ENCOUNTER — Telehealth: Payer: Self-pay | Admitting: Neurology

## 2014-02-08 ENCOUNTER — Other Ambulatory Visit: Payer: Self-pay | Admitting: Neurology

## 2014-02-08 NOTE — Telephone Encounter (Signed)
Patient's wife calling requesting an urgent appointment so that he can get his medications refilled. Please call to advise.

## 2014-02-08 NOTE — Telephone Encounter (Signed)
R/T patient call to schedule appointment with [facility] next week. Answering machine not accepting messages.

## 2014-02-08 NOTE — Telephone Encounter (Signed)
Called again. Got no answer.

## 2014-02-09 ENCOUNTER — Other Ambulatory Visit: Payer: Self-pay | Admitting: Neurology

## 2014-02-09 ENCOUNTER — Other Ambulatory Visit: Payer: Self-pay

## 2014-02-09 MED ORDER — DIAZEPAM 5 MG PO TABS: ORAL_TABLET | ORAL | Status: DC

## 2014-02-09 NOTE — Telephone Encounter (Signed)
Patient last seen in May 2013.  Has an appt scheduled this month .

## 2014-02-09 NOTE — Telephone Encounter (Signed)
Called Jordan Ward and spoke with Jordan Ward's wife Arville Go to make an appt for the Jordan Ward on 02/13/14 with Jinny Blossom, NP. I advised Jordan Ward's wife that if the Jordan Ward has any other problems, questions or concerns to call the office. Jordan Ward's wife verbalized understanding.

## 2014-02-09 NOTE — Telephone Encounter (Signed)
Rx signed and faxed.

## 2014-02-10 ENCOUNTER — Encounter: Payer: Self-pay | Admitting: *Deleted

## 2014-02-13 ENCOUNTER — Ambulatory Visit: Payer: Self-pay | Admitting: Adult Health

## 2014-02-13 ENCOUNTER — Telehealth: Payer: Self-pay | Admitting: Adult Health

## 2014-02-13 NOTE — Telephone Encounter (Signed)
Patient was a no show to scheduled appointment.

## 2014-02-16 ENCOUNTER — Encounter: Payer: Self-pay | Admitting: Adult Health

## 2014-02-21 ENCOUNTER — Ambulatory Visit (INDEPENDENT_AMBULATORY_CARE_PROVIDER_SITE_OTHER): Payer: Medicare Other | Admitting: Urology

## 2014-02-21 DIAGNOSIS — R339 Retention of urine, unspecified: Secondary | ICD-10-CM | POA: Diagnosis not present

## 2014-03-13 ENCOUNTER — Encounter: Payer: Self-pay | Admitting: Adult Health

## 2014-03-13 ENCOUNTER — Ambulatory Visit (INDEPENDENT_AMBULATORY_CARE_PROVIDER_SITE_OTHER): Payer: Medicare Other | Admitting: Adult Health

## 2014-03-13 VITALS — BP 132/68 | HR 68 | Wt 133.0 lb

## 2014-03-13 DIAGNOSIS — R51 Headache: Secondary | ICD-10-CM | POA: Diagnosis not present

## 2014-03-13 DIAGNOSIS — R269 Unspecified abnormalities of gait and mobility: Secondary | ICD-10-CM | POA: Diagnosis not present

## 2014-03-13 DIAGNOSIS — R519 Headache, unspecified: Secondary | ICD-10-CM | POA: Insufficient documentation

## 2014-03-13 DIAGNOSIS — I679 Cerebrovascular disease, unspecified: Secondary | ICD-10-CM

## 2014-03-13 DIAGNOSIS — I639 Cerebral infarction, unspecified: Secondary | ICD-10-CM | POA: Insufficient documentation

## 2014-03-13 MED ORDER — DIAZEPAM 5 MG PO TABS: ORAL_TABLET | ORAL | Status: DC

## 2014-03-13 NOTE — Patient Instructions (Signed)
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
## 2014-03-13 NOTE — Progress Notes (Signed)
I have read the note, and I agree with the clinical assessment and plan.  WILLIS,CHARLES KEITH   

## 2014-03-23 DIAGNOSIS — J8410 Pulmonary fibrosis, unspecified: Secondary | ICD-10-CM | POA: Diagnosis not present

## 2014-03-23 DIAGNOSIS — E039 Hypothyroidism, unspecified: Secondary | ICD-10-CM | POA: Diagnosis not present

## 2014-03-23 DIAGNOSIS — D649 Anemia, unspecified: Secondary | ICD-10-CM | POA: Diagnosis not present

## 2014-03-23 DIAGNOSIS — G35 Multiple sclerosis: Secondary | ICD-10-CM | POA: Diagnosis not present

## 2014-03-24 ENCOUNTER — Ambulatory Visit (INDEPENDENT_AMBULATORY_CARE_PROVIDER_SITE_OTHER): Payer: Medicare Other | Admitting: Urology

## 2014-03-24 DIAGNOSIS — R339 Retention of urine, unspecified: Secondary | ICD-10-CM | POA: Diagnosis not present

## 2014-04-11 ENCOUNTER — Other Ambulatory Visit: Payer: Self-pay | Admitting: Adult Health

## 2014-04-11 NOTE — Telephone Encounter (Signed)
Rx signed and faxed.

## 2014-04-28 ENCOUNTER — Ambulatory Visit (INDEPENDENT_AMBULATORY_CARE_PROVIDER_SITE_OTHER): Payer: Medicare Other | Admitting: Urology

## 2014-04-28 DIAGNOSIS — R339 Retention of urine, unspecified: Secondary | ICD-10-CM

## 2014-05-15 DIAGNOSIS — D649 Anemia, unspecified: Secondary | ICD-10-CM | POA: Diagnosis not present

## 2014-05-15 DIAGNOSIS — G35 Multiple sclerosis: Secondary | ICD-10-CM | POA: Diagnosis not present

## 2014-05-15 DIAGNOSIS — E039 Hypothyroidism, unspecified: Secondary | ICD-10-CM | POA: Diagnosis not present

## 2014-05-22 DIAGNOSIS — M545 Low back pain, unspecified: Secondary | ICD-10-CM | POA: Diagnosis not present

## 2014-05-22 DIAGNOSIS — I10 Essential (primary) hypertension: Secondary | ICD-10-CM | POA: Diagnosis not present

## 2014-05-22 DIAGNOSIS — G35 Multiple sclerosis: Secondary | ICD-10-CM | POA: Diagnosis not present

## 2014-05-30 ENCOUNTER — Ambulatory Visit (INDEPENDENT_AMBULATORY_CARE_PROVIDER_SITE_OTHER): Payer: Medicare Other | Admitting: Urology

## 2014-05-30 DIAGNOSIS — N319 Neuromuscular dysfunction of bladder, unspecified: Secondary | ICD-10-CM

## 2014-05-30 DIAGNOSIS — R339 Retention of urine, unspecified: Secondary | ICD-10-CM | POA: Diagnosis not present

## 2014-06-08 ENCOUNTER — Other Ambulatory Visit: Payer: Self-pay | Admitting: Neurology

## 2014-06-20 DIAGNOSIS — I10 Essential (primary) hypertension: Secondary | ICD-10-CM | POA: Diagnosis not present

## 2014-06-20 DIAGNOSIS — N390 Urinary tract infection, site not specified: Secondary | ICD-10-CM | POA: Diagnosis not present

## 2014-06-20 DIAGNOSIS — G35 Multiple sclerosis: Secondary | ICD-10-CM | POA: Diagnosis not present

## 2014-06-20 DIAGNOSIS — I2510 Atherosclerotic heart disease of native coronary artery without angina pectoris: Secondary | ICD-10-CM | POA: Diagnosis not present

## 2014-06-20 DIAGNOSIS — E039 Hypothyroidism, unspecified: Secondary | ICD-10-CM | POA: Diagnosis not present

## 2014-06-27 ENCOUNTER — Ambulatory Visit (INDEPENDENT_AMBULATORY_CARE_PROVIDER_SITE_OTHER): Payer: Medicare Other | Admitting: Urology

## 2014-06-27 DIAGNOSIS — R339 Retention of urine, unspecified: Secondary | ICD-10-CM

## 2014-07-04 ENCOUNTER — Ambulatory Visit (INDEPENDENT_AMBULATORY_CARE_PROVIDER_SITE_OTHER): Payer: Medicare Other | Admitting: Urology

## 2014-07-04 DIAGNOSIS — R3914 Feeling of incomplete bladder emptying: Secondary | ICD-10-CM

## 2014-07-16 DIAGNOSIS — R509 Fever, unspecified: Secondary | ICD-10-CM | POA: Diagnosis not present

## 2014-07-17 ENCOUNTER — Encounter (HOSPITAL_COMMUNITY): Payer: Self-pay | Admitting: Emergency Medicine

## 2014-07-17 ENCOUNTER — Inpatient Hospital Stay (HOSPITAL_COMMUNITY)
Admission: EM | Admit: 2014-07-17 | Discharge: 2014-07-26 | DRG: 698 | Disposition: A | Discharge status: Skilled Nursing Facility | Payer: Medicare Other | Attending: Internal Medicine | Admitting: Internal Medicine

## 2014-07-17 ENCOUNTER — Emergency Department (HOSPITAL_COMMUNITY): Payer: Medicare Other

## 2014-07-17 ENCOUNTER — Inpatient Hospital Stay (HOSPITAL_COMMUNITY): Payer: Medicare Other

## 2014-07-17 DIAGNOSIS — L89152 Pressure ulcer of sacral region, stage 2: Secondary | ICD-10-CM | POA: Diagnosis present

## 2014-07-17 DIAGNOSIS — J8410 Pulmonary fibrosis, unspecified: Secondary | ICD-10-CM | POA: Diagnosis present

## 2014-07-17 DIAGNOSIS — E872 Acidosis, unspecified: Secondary | ICD-10-CM

## 2014-07-17 DIAGNOSIS — R0602 Shortness of breath: Secondary | ICD-10-CM | POA: Diagnosis not present

## 2014-07-17 DIAGNOSIS — R509 Fever, unspecified: Secondary | ICD-10-CM | POA: Diagnosis not present

## 2014-07-17 DIAGNOSIS — I5021 Acute systolic (congestive) heart failure: Secondary | ICD-10-CM | POA: Diagnosis not present

## 2014-07-17 DIAGNOSIS — I359 Nonrheumatic aortic valve disorder, unspecified: Secondary | ICD-10-CM | POA: Diagnosis not present

## 2014-07-17 DIAGNOSIS — A419 Sepsis, unspecified organism: Secondary | ICD-10-CM | POA: Diagnosis not present

## 2014-07-17 DIAGNOSIS — R269 Unspecified abnormalities of gait and mobility: Secondary | ICD-10-CM | POA: Diagnosis not present

## 2014-07-17 DIAGNOSIS — I2510 Atherosclerotic heart disease of native coronary artery without angina pectoris: Secondary | ICD-10-CM | POA: Diagnosis present

## 2014-07-17 DIAGNOSIS — R748 Abnormal levels of other serum enzymes: Secondary | ICD-10-CM | POA: Diagnosis not present

## 2014-07-17 DIAGNOSIS — Y846 Urinary catheterization as the cause of abnormal reaction of the patient, or of later complication, without mention of misadventure at the time of the procedure: Secondary | ICD-10-CM | POA: Diagnosis present

## 2014-07-17 DIAGNOSIS — N179 Acute kidney failure, unspecified: Secondary | ICD-10-CM | POA: Diagnosis not present

## 2014-07-17 DIAGNOSIS — I519 Heart disease, unspecified: Secondary | ICD-10-CM

## 2014-07-17 DIAGNOSIS — E039 Hypothyroidism, unspecified: Secondary | ICD-10-CM | POA: Diagnosis present

## 2014-07-17 DIAGNOSIS — Z823 Family history of stroke: Secondary | ICD-10-CM | POA: Diagnosis not present

## 2014-07-17 DIAGNOSIS — E876 Hypokalemia: Secondary | ICD-10-CM | POA: Diagnosis not present

## 2014-07-17 DIAGNOSIS — Z820 Family history of epilepsy and other diseases of the nervous system: Secondary | ICD-10-CM

## 2014-07-17 DIAGNOSIS — N200 Calculus of kidney: Secondary | ICD-10-CM | POA: Diagnosis present

## 2014-07-17 DIAGNOSIS — N1330 Unspecified hydronephrosis: Secondary | ICD-10-CM

## 2014-07-17 DIAGNOSIS — M6281 Muscle weakness (generalized): Secondary | ICD-10-CM | POA: Diagnosis not present

## 2014-07-17 DIAGNOSIS — L8990 Pressure ulcer of unspecified site, unspecified stage: Secondary | ICD-10-CM

## 2014-07-17 DIAGNOSIS — I255 Ischemic cardiomyopathy: Secondary | ICD-10-CM | POA: Diagnosis present

## 2014-07-17 DIAGNOSIS — E878 Other disorders of electrolyte and fluid balance, not elsewhere classified: Secondary | ICD-10-CM | POA: Diagnosis present

## 2014-07-17 DIAGNOSIS — R1313 Dysphagia, pharyngeal phase: Secondary | ICD-10-CM | POA: Diagnosis present

## 2014-07-17 DIAGNOSIS — I10 Essential (primary) hypertension: Secondary | ICD-10-CM

## 2014-07-17 DIAGNOSIS — K529 Noninfective gastroenteritis and colitis, unspecified: Secondary | ICD-10-CM

## 2014-07-17 DIAGNOSIS — D6959 Other secondary thrombocytopenia: Secondary | ICD-10-CM | POA: Diagnosis present

## 2014-07-17 DIAGNOSIS — G35 Multiple sclerosis: Secondary | ICD-10-CM | POA: Diagnosis present

## 2014-07-17 DIAGNOSIS — B965 Pseudomonas (aeruginosa) (mallei) (pseudomallei) as the cause of diseases classified elsewhere: Secondary | ICD-10-CM | POA: Diagnosis present

## 2014-07-17 DIAGNOSIS — I498 Other specified cardiac arrhythmias: Secondary | ICD-10-CM | POA: Diagnosis not present

## 2014-07-17 DIAGNOSIS — N319 Neuromuscular dysfunction of bladder, unspecified: Secondary | ICD-10-CM | POA: Diagnosis not present

## 2014-07-17 DIAGNOSIS — Z7401 Bed confinement status: Secondary | ICD-10-CM | POA: Diagnosis not present

## 2014-07-17 DIAGNOSIS — G9340 Encephalopathy, unspecified: Secondary | ICD-10-CM | POA: Diagnosis present

## 2014-07-17 DIAGNOSIS — R6521 Severe sepsis with septic shock: Secondary | ICD-10-CM | POA: Diagnosis present

## 2014-07-17 DIAGNOSIS — R079 Chest pain, unspecified: Secondary | ICD-10-CM

## 2014-07-17 DIAGNOSIS — I739 Peripheral vascular disease, unspecified: Secondary | ICD-10-CM | POA: Diagnosis present

## 2014-07-17 DIAGNOSIS — A0472 Enterocolitis due to Clostridium difficile, not specified as recurrent: Secondary | ICD-10-CM

## 2014-07-17 DIAGNOSIS — Z9889 Other specified postprocedural states: Secondary | ICD-10-CM | POA: Diagnosis not present

## 2014-07-17 DIAGNOSIS — R7881 Bacteremia: Secondary | ICD-10-CM

## 2014-07-17 DIAGNOSIS — E870 Hyperosmolality and hypernatremia: Secondary | ICD-10-CM | POA: Diagnosis present

## 2014-07-17 DIAGNOSIS — Z978 Presence of other specified devices: Secondary | ICD-10-CM

## 2014-07-17 DIAGNOSIS — R778 Other specified abnormalities of plasma proteins: Secondary | ICD-10-CM

## 2014-07-17 DIAGNOSIS — W19XXXA Unspecified fall, initial encounter: Secondary | ICD-10-CM | POA: Diagnosis present

## 2014-07-17 DIAGNOSIS — R109 Unspecified abdominal pain: Secondary | ICD-10-CM | POA: Diagnosis not present

## 2014-07-17 DIAGNOSIS — D649 Anemia, unspecified: Secondary | ICD-10-CM | POA: Diagnosis present

## 2014-07-17 DIAGNOSIS — E162 Hypoglycemia, unspecified: Secondary | ICD-10-CM | POA: Diagnosis present

## 2014-07-17 DIAGNOSIS — R945 Abnormal results of liver function studies: Secondary | ICD-10-CM

## 2014-07-17 DIAGNOSIS — G4733 Obstructive sleep apnea (adult) (pediatric): Secondary | ICD-10-CM | POA: Diagnosis present

## 2014-07-17 DIAGNOSIS — R6520 Severe sepsis without septic shock: Secondary | ICD-10-CM | POA: Diagnosis not present

## 2014-07-17 DIAGNOSIS — N390 Urinary tract infection, site not specified: Secondary | ICD-10-CM | POA: Diagnosis present

## 2014-07-17 DIAGNOSIS — R278 Other lack of coordination: Secondary | ICD-10-CM | POA: Diagnosis not present

## 2014-07-17 DIAGNOSIS — R1310 Dysphagia, unspecified: Secondary | ICD-10-CM | POA: Diagnosis not present

## 2014-07-17 DIAGNOSIS — Z8673 Personal history of transient ischemic attack (TIA), and cerebral infarction without residual deficits: Secondary | ICD-10-CM | POA: Diagnosis not present

## 2014-07-17 DIAGNOSIS — D508 Other iron deficiency anemias: Secondary | ICD-10-CM

## 2014-07-17 DIAGNOSIS — T8351XA Infection and inflammatory reaction due to indwelling urinary catheter, initial encounter: Secondary | ICD-10-CM | POA: Diagnosis not present

## 2014-07-17 DIAGNOSIS — E785 Hyperlipidemia, unspecified: Secondary | ICD-10-CM | POA: Diagnosis present

## 2014-07-17 DIAGNOSIS — Z87891 Personal history of nicotine dependence: Secondary | ICD-10-CM

## 2014-07-17 DIAGNOSIS — N261 Atrophy of kidney (terminal): Secondary | ICD-10-CM | POA: Diagnosis present

## 2014-07-17 DIAGNOSIS — A4150 Gram-negative sepsis, unspecified: Secondary | ICD-10-CM | POA: Diagnosis not present

## 2014-07-17 DIAGNOSIS — R9431 Abnormal electrocardiogram [ECG] [EKG]: Secondary | ICD-10-CM

## 2014-07-17 DIAGNOSIS — R740 Nonspecific elevation of levels of transaminase and lactic acid dehydrogenase [LDH]: Secondary | ICD-10-CM | POA: Diagnosis present

## 2014-07-17 DIAGNOSIS — I248 Other forms of acute ischemic heart disease: Secondary | ICD-10-CM | POA: Diagnosis not present

## 2014-07-17 DIAGNOSIS — G8929 Other chronic pain: Secondary | ICD-10-CM | POA: Diagnosis present

## 2014-07-17 DIAGNOSIS — L89513 Pressure ulcer of right ankle, stage 3: Secondary | ICD-10-CM | POA: Diagnosis present

## 2014-07-17 DIAGNOSIS — Z4682 Encounter for fitting and adjustment of non-vascular catheter: Secondary | ICD-10-CM | POA: Diagnosis not present

## 2014-07-17 DIAGNOSIS — Z960 Presence of urogenital implants: Secondary | ICD-10-CM

## 2014-07-17 DIAGNOSIS — I471 Supraventricular tachycardia: Secondary | ICD-10-CM | POA: Diagnosis present

## 2014-07-17 DIAGNOSIS — I420 Dilated cardiomyopathy: Secondary | ICD-10-CM | POA: Diagnosis not present

## 2014-07-17 DIAGNOSIS — N289 Disorder of kidney and ureter, unspecified: Secondary | ICD-10-CM | POA: Diagnosis not present

## 2014-07-17 DIAGNOSIS — R7989 Other specified abnormal findings of blood chemistry: Secondary | ICD-10-CM | POA: Diagnosis not present

## 2014-07-17 DIAGNOSIS — I059 Rheumatic mitral valve disease, unspecified: Secondary | ICD-10-CM | POA: Diagnosis not present

## 2014-07-17 DIAGNOSIS — Z22322 Carrier or suspected carrier of Methicillin resistant Staphylococcus aureus: Secondary | ICD-10-CM

## 2014-07-17 DIAGNOSIS — T83511D Infection and inflammatory reaction due to indwelling urethral catheter, subsequent encounter: Secondary | ICD-10-CM

## 2014-07-17 DIAGNOSIS — R262 Difficulty in walking, not elsewhere classified: Secondary | ICD-10-CM | POA: Diagnosis not present

## 2014-07-17 DIAGNOSIS — A047 Enterocolitis due to Clostridium difficile: Secondary | ICD-10-CM | POA: Diagnosis not present

## 2014-07-17 DIAGNOSIS — I214 Non-ST elevation (NSTEMI) myocardial infarction: Secondary | ICD-10-CM | POA: Diagnosis not present

## 2014-07-17 DIAGNOSIS — T8351XD Infection and inflammatory reaction due to indwelling urinary catheter, subsequent encounter: Secondary | ICD-10-CM | POA: Diagnosis not present

## 2014-07-17 LAB — CBC WITH DIFFERENTIAL/PLATELET
Basophils Absolute: 0 10*3/uL (ref 0.0–0.1)
Basophils Relative: 0 % (ref 0–1)
EOS PCT: 0 % (ref 0–5)
Eosinophils Absolute: 0 10*3/uL (ref 0.0–0.7)
HEMATOCRIT: 38.2 % — AB (ref 39.0–52.0)
HEMOGLOBIN: 12.7 g/dL — AB (ref 13.0–17.0)
LYMPHS ABS: 1 10*3/uL (ref 0.7–4.0)
LYMPHS PCT: 4 % — AB (ref 12–46)
MCH: 30.3 pg (ref 26.0–34.0)
MCHC: 33.2 g/dL (ref 30.0–36.0)
MCV: 91.2 fL (ref 78.0–100.0)
MONOS PCT: 3 % (ref 3–12)
Monocytes Absolute: 0.7 10*3/uL (ref 0.1–1.0)
Neutro Abs: 21.3 10*3/uL — ABNORMAL HIGH (ref 1.7–7.7)
Neutrophils Relative %: 93 % — ABNORMAL HIGH (ref 43–77)
PLATELETS: 164 10*3/uL (ref 150–400)
RBC: 4.19 MIL/uL — AB (ref 4.22–5.81)
RDW: 14.6 % (ref 11.5–15.5)
WBC MORPHOLOGY: INCREASED
WBC: 23 10*3/uL — AB (ref 4.0–10.5)

## 2014-07-17 LAB — CREATININE, SERUM
CREATININE: 3.96 mg/dL — AB (ref 0.50–1.35)
GFR calc non Af Amer: 14 mL/min — ABNORMAL LOW (ref >90)
GFR, EST AFRICAN AMERICAN: 16 mL/min — AB (ref >90)

## 2014-07-17 LAB — GLUCOSE, CAPILLARY
Glucose-Capillary: 57 mg/dL — ABNORMAL LOW (ref 70–99)
Glucose-Capillary: 69 mg/dL — ABNORMAL LOW (ref 70–99)
Glucose-Capillary: 89 mg/dL (ref 70–99)

## 2014-07-17 LAB — URINE MICROSCOPIC-ADD ON

## 2014-07-17 LAB — BLOOD GAS, ARTERIAL
ACID-BASE DEFICIT: 9 mmol/L — AB (ref 0.0–2.0)
BICARBONATE: 15 meq/L — AB (ref 20.0–24.0)
Drawn by: 41977
FIO2: 0.21 %
O2 SAT: 88.1 %
PO2 ART: 56.3 mmHg — AB (ref 80.0–100.0)
Patient temperature: 98.6
TCO2: 15.7 mmol/L (ref 0–100)
pCO2 arterial: 25.1 mmHg — ABNORMAL LOW (ref 35.0–45.0)
pH, Arterial: 7.392 (ref 7.350–7.450)

## 2014-07-17 LAB — URINALYSIS, ROUTINE W REFLEX MICROSCOPIC
BILIRUBIN URINE: NEGATIVE
Glucose, UA: NEGATIVE mg/dL
KETONES UR: NEGATIVE mg/dL
Nitrite: NEGATIVE
PROTEIN: 100 mg/dL — AB
Specific Gravity, Urine: 1.015 (ref 1.005–1.030)
UROBILINOGEN UA: 0.2 mg/dL (ref 0.0–1.0)
pH: >9 — ABNORMAL HIGH (ref 5.0–8.0)

## 2014-07-17 LAB — BASIC METABOLIC PANEL
Anion gap: 19 — ABNORMAL HIGH (ref 5–15)
BUN: 52 mg/dL — ABNORMAL HIGH (ref 6–23)
CO2: 17 mEq/L — ABNORMAL LOW (ref 19–32)
CREATININE: 3.95 mg/dL — AB (ref 0.50–1.35)
Calcium: 8.1 mg/dL — ABNORMAL LOW (ref 8.4–10.5)
Chloride: 110 mEq/L (ref 96–112)
GFR calc non Af Amer: 14 mL/min — ABNORMAL LOW (ref >90)
GFR, EST AFRICAN AMERICAN: 16 mL/min — AB (ref >90)
Glucose, Bld: 80 mg/dL (ref 70–99)
Potassium: 3.8 mEq/L (ref 3.7–5.3)
Sodium: 146 mEq/L (ref 137–147)

## 2014-07-17 LAB — PROTIME-INR
INR: 1.61 — AB (ref 0.00–1.49)
Prothrombin Time: 19.3 seconds — ABNORMAL HIGH (ref 11.6–15.2)

## 2014-07-17 LAB — COMPREHENSIVE METABOLIC PANEL
ALT: 58 U/L — AB (ref 0–53)
AST: 239 U/L — AB (ref 0–37)
Albumin: 3 g/dL — ABNORMAL LOW (ref 3.5–5.2)
Alkaline Phosphatase: 113 U/L (ref 39–117)
BILIRUBIN TOTAL: 1.2 mg/dL (ref 0.3–1.2)
BUN: 49 mg/dL — ABNORMAL HIGH (ref 6–23)
CHLORIDE: 104 meq/L (ref 96–112)
CO2: 17 meq/L — AB (ref 19–32)
CREATININE: 3.91 mg/dL — AB (ref 0.50–1.35)
Calcium: 9.7 mg/dL (ref 8.4–10.5)
GFR, EST AFRICAN AMERICAN: 16 mL/min — AB (ref >90)
GFR, EST NON AFRICAN AMERICAN: 14 mL/min — AB (ref >90)
GLUCOSE: 106 mg/dL — AB (ref 70–99)
Potassium: 3.6 mEq/L — ABNORMAL LOW (ref 3.7–5.3)
SODIUM: 146 meq/L (ref 137–147)
Total Protein: 7.7 g/dL (ref 6.0–8.3)

## 2014-07-17 LAB — PROCALCITONIN: PROCALCITONIN: 59.17 ng/mL

## 2014-07-17 LAB — CBC
HCT: 34.1 % — ABNORMAL LOW (ref 39.0–52.0)
Hemoglobin: 11 g/dL — ABNORMAL LOW (ref 13.0–17.0)
MCH: 29.7 pg (ref 26.0–34.0)
MCHC: 32.3 g/dL (ref 30.0–36.0)
MCV: 92.2 fL (ref 78.0–100.0)
Platelets: 95 10*3/uL — ABNORMAL LOW (ref 150–400)
RBC: 3.7 MIL/uL — AB (ref 4.22–5.81)
RDW: 14.9 % (ref 11.5–15.5)
WBC: 14.5 10*3/uL — AB (ref 4.0–10.5)

## 2014-07-17 LAB — TROPONIN I
TROPONIN I: 1.77 ng/mL — AB (ref <0.30)
Troponin I: 1.28 ng/mL (ref <0.30)

## 2014-07-17 LAB — LACTIC ACID, PLASMA
LACTIC ACID, VENOUS: 7.4 mmol/L — AB (ref 0.5–2.2)
LACTIC ACID, VENOUS: 4.4 mmol/L — AB (ref 0.5–2.2)
Lactic Acid, Venous: 4.5 mmol/L — ABNORMAL HIGH (ref 0.5–2.2)

## 2014-07-17 LAB — FIBRINOGEN: Fibrinogen: 488 mg/dL — ABNORMAL HIGH (ref 204–475)

## 2014-07-17 LAB — TYPE AND SCREEN
ABO/RH(D): O POS
ANTIBODY SCREEN: NEGATIVE

## 2014-07-17 LAB — MRSA PCR SCREENING: MRSA BY PCR: POSITIVE — AB

## 2014-07-17 LAB — APTT: aPTT: 38 seconds — ABNORMAL HIGH (ref 24–37)

## 2014-07-17 MED ORDER — INSULIN ASPART 100 UNIT/ML ~~LOC~~ SOLN: 1.0000 [IU] | SUBCUTANEOUS | Status: DC

## 2014-07-17 MED ORDER — CHLORHEXIDINE GLUCONATE CLOTH 2 % EX PADS
6.0000 | MEDICATED_PAD | Freq: Every day | CUTANEOUS | Status: AC
  Administered 2014-07-18 – 2014-07-21 (×4): 6 via TOPICAL

## 2014-07-17 MED ORDER — HEPARIN BOLUS VIA INFUSION
2000.0000 [IU] | Freq: Once | INTRAVENOUS | Status: AC
  Administered 2014-07-17: 2000 [IU] via INTRAVENOUS
  Filled 2014-07-17: qty 2000

## 2014-07-17 MED ORDER — DEXTROSE 50 % IV SOLN
25.0000 mL | Freq: Once | INTRAVENOUS | Status: AC | PRN
  Administered 2014-07-17: 25 mL via INTRAVENOUS
  Filled 2014-07-17: qty 50

## 2014-07-17 MED ORDER — PIPERACILLIN-TAZOBACTAM 3.375 G IVPB 30 MIN
3.3750 g | Freq: Once | INTRAVENOUS | Status: AC
  Administered 2014-07-17: 3.375 g via INTRAVENOUS
  Filled 2014-07-17: qty 50

## 2014-07-17 MED ORDER — ACETAMINOPHEN 650 MG RE SUPP
650.0000 mg | Freq: Four times a day (QID) | RECTAL | Status: DC | PRN
  Administered 2014-07-18: 650 mg via RECTAL
  Filled 2014-07-17: qty 1

## 2014-07-17 MED ORDER — SODIUM CHLORIDE 0.9 % IV BOLUS (SEPSIS)
1000.0000 mL | Freq: Once | INTRAVENOUS | Status: AC
  Administered 2014-07-17: 1000 mL via INTRAVENOUS

## 2014-07-17 MED ORDER — SODIUM CHLORIDE 0.9 % IV SOLN
1000.0000 mL | Freq: Once | INTRAVENOUS | Status: AC
  Administered 2014-07-17: 1000 mL via INTRAVENOUS

## 2014-07-17 MED ORDER — PHENYLEPHRINE HCL 10 MG/ML IJ SOLN
20.0000 ug/min | INTRAMUSCULAR | Status: DC
  Filled 2014-07-17: qty 1

## 2014-07-17 MED ORDER — DEXTROSE-NACL 5-0.45 % IV SOLN
INTRAVENOUS | Status: DC
  Administered 2014-07-17: 100 mL via INTRAVENOUS
  Administered 2014-07-18: 23:00:00 via INTRAVENOUS

## 2014-07-17 MED ORDER — ASPIRIN 81 MG PO CHEW: 324.0000 mg | CHEWABLE_TABLET | ORAL | Status: AC

## 2014-07-17 MED ORDER — SODIUM CHLORIDE 0.9 % IV BOLUS (SEPSIS)
2000.0000 mL | Freq: Once | INTRAVENOUS | Status: AC
  Administered 2014-07-17: 2000 mL via INTRAVENOUS

## 2014-07-17 MED ORDER — ASPIRIN 300 MG RE SUPP
300.0000 mg | RECTAL | Status: AC
  Administered 2014-07-17: 300 mg via RECTAL
  Filled 2014-07-17: qty 1

## 2014-07-17 MED ORDER — SODIUM CHLORIDE 0.9 % IV SOLN
1000.0000 mL | INTRAVENOUS | Status: DC
  Administered 2014-07-17: 1000 mL via INTRAVENOUS

## 2014-07-17 MED ORDER — DEXTROSE 50 % IV SOLN
25.0000 mL | Freq: Once | INTRAVENOUS | Status: AC | PRN
  Administered 2014-07-17: 25 mL via INTRAVENOUS

## 2014-07-17 MED ORDER — DEXTROSE 5 % IV SOLN
2.0000 g | Freq: Once | INTRAVENOUS | Status: DC
  Administered 2014-07-17: 2 g via INTRAVENOUS
  Filled 2014-07-17: qty 2

## 2014-07-17 MED ORDER — HEPARIN (PORCINE) IN NACL 100-0.45 UNIT/ML-% IJ SOLN
650.0000 [IU]/h | INTRAMUSCULAR | Status: DC
  Administered 2014-07-17: 650 [IU]/h via INTRAVENOUS
  Filled 2014-07-17: qty 250

## 2014-07-17 MED ORDER — METRONIDAZOLE IN NACL 5-0.79 MG/ML-% IV SOLN
500.0000 mg | Freq: Three times a day (TID) | INTRAVENOUS | Status: DC
  Administered 2014-07-17 – 2014-07-18 (×2): 500 mg via INTRAVENOUS
  Filled 2014-07-17 (×4): qty 100

## 2014-07-17 MED ORDER — VANCOMYCIN 50 MG/ML ORAL SOLUTION
500.0000 mg | Freq: Four times a day (QID) | ORAL | Status: DC
Start: 2014-07-18 — End: 2014-07-18
  Administered 2014-07-18 (×2): 500 mg via ORAL
  Filled 2014-07-17 (×7): qty 10

## 2014-07-17 MED ORDER — MUPIROCIN 2 % EX OINT
1.0000 | TOPICAL_OINTMENT | Freq: Two times a day (BID) | CUTANEOUS | Status: AC
Start: 2014-07-17 — End: 2014-07-21
  Administered 2014-07-17 – 2014-07-21 (×9): 1 via NASAL
  Filled 2014-07-17 (×2): qty 22

## 2014-07-17 MED ORDER — SODIUM CHLORIDE 0.9 % IV SOLN
250.0000 mL | INTRAVENOUS | Status: DC | PRN
  Administered 2014-07-24: 250 mL via INTRAVENOUS

## 2014-07-17 MED ORDER — VANCOMYCIN HCL IN DEXTROSE 1-5 GM/200ML-% IV SOLN
1000.0000 mg | Freq: Once | INTRAVENOUS | Status: AC
  Administered 2014-07-17: 1000 mg via INTRAVENOUS
  Filled 2014-07-17: qty 200

## 2014-07-17 MED ORDER — SODIUM CHLORIDE 0.45 % IV SOLN
INTRAVENOUS | Status: DC
  Administered 2014-07-17: 1000 mL via INTRAVENOUS

## 2014-07-17 MED ORDER — CEFTRIAXONE SODIUM 2 G IJ SOLR
2.0000 g | INTRAMUSCULAR | Status: DC
  Filled 2014-07-17: qty 2

## 2014-07-17 MED ORDER — LEVOTHYROXINE SODIUM 50 MCG PO TABS
50.0000 ug | ORAL_TABLET | Freq: Every day | ORAL | Status: DC
  Administered 2014-07-18 – 2014-07-26 (×8): 50 ug via ORAL
  Filled 2014-07-17 (×10): qty 1

## 2014-07-17 MED ORDER — HEPARIN SODIUM (PORCINE) 5000 UNIT/ML IJ SOLN
5000.0000 [IU] | Freq: Three times a day (TID) | INTRAMUSCULAR | Status: DC
Start: 2014-07-17 — End: 2014-07-17

## 2014-07-17 NOTE — ED Provider Notes (Signed)
CSN: [id number]     Arrival date & time 07/17/14  1029 History  This chart was scribed for Janice Norrie, MD by Marlowe Kays, ED Scribe. This patient was seen in room APA09/APA09 and the patient's care was started at 11:13 AM.   Chief Complaint  Patient presents with  . Urinary Tract Infection   Patient is a 73 y.o. male presenting with urinary tract infection.  Urinary Tract Infection Associated symptoms include abdominal pain.   HPI Comments:  RANDLE ZEBROWSKI is a 73 y.o. male who presents to the Emergency Department complaining of slurred speech that began last night. It lasted about 1 hr then he went to bed.  Wife reports EMS was called to her house last night prior to that secondary to a fall. He was in the bathroom and his legs got weak and buckled and he fell with his head next to the commode and family was afraid to try to lift him up. He had a fever last night while EMS was present Tmax 101.6 degrees axillary. She states patient was normal until yesterday afternoon about 4 pm when she talked to him on the phone and he reported he had began to have abdominal pain around his supra-pubic catheter. She states she was out of town yesterday and could not reach him on the phone after that phone call  so his daughter went to check on him, finding him on the floor in the bathroom. EMS got patient up but he refused transport to the ED at that time. His wife reports he has been producing a very small amount of dark urine from his catheter. She gave pt Ibuprofen earlier this morning because he felt hot to touch and pt went to sleep. She woke him up about one hour later secondary to him shaking with chills and then called EMS again. The couple report that the slurred speech began again while he was having chills. He is ambulatory with a cane at baseline and normally does all his own care, including helping wash dishes, but he reports he could not walk this morning because he was feeling weak all over. Denies  nausea and vomiting.  Wife reports he had a UTI 3 weeks ago and he was having difficulty taking the Cipro antibiotics because of the size of the pills. He was having difficulty swallowing them. She reports his urologist did a urine culture that was negative so they did not continue antibiotics.  PCP is Dr. Gerarda Fraction  Urology Dr Eulogio Ditch    Past Medical History  Diagnosis Date  . Arthritis   . MS (multiple sclerosis)   . Insomnia   . PVD (peripheral vascular disease)   . Carotid artery stenosis   . TIA (transient ischemic attack)   . Chronic indwelling Foley catheter 10/06/2011  . Pulmonary fibrosis 10/06/2011  . Dysphagia 10/07/2011  . Tremors of nervous system 10/08/2011  . Hypothyroidism 10/08/2011  . Pulmonary nodule 10/08/2011  . C. difficile colitis 09/2011  . Pneumonia   . Encephalopathy   . Urinary tract infection   . Tremors of nervous system   . Hypokalemia   . Junctional rhythm   . Anemia   . Hypernatremia   . Sacral ulcer   . HTN (hypertension), malignant 10/06/2011  . Sleep apnea   . Neuromuscular disorder   . Back pain, chronic   . Kidney stones   . High grade dysplasia in colonic adenoma 09/2005  . Gait disorder   . OSA (obstructive  sleep apnea)   . Paroxysmal atrial tachycardia   . Dyslipidemia   . Cerebrovascular disease   . CAD (coronary artery disease)   . Peripheral vascular disease   . Bilateral carotid bruits   . Colon polyps   . HA (headache)    Past Surgical History  Procedure Laterality Date  . Inguinal hernia repair  1971    bilateral  . Back surgery  1976/1979  . Colonoscopy  11/2004    Dr. Sharol Roussel sessile polyp splenic flexure, 20mm sessile polyp desc colon, tubulovillous adenoma (bx not removed)  . Colonoscopy  01/2005    poor prep, polyp could not be found  . Colonoscopy  05/2005    with EMR, polypectomy Dr. Olegario Messier, bx showed high grade dysplasia, partially resected  . Colonoscopy  09/2005    Dr. Arsenio Loader, [place] ink tattooing, four  villous colon polyp (3 had been missed on previous colonoscopies due to limitations of procedures  . Colon surgery  09/2005    Fleishman: four tubular adenomas, large adenomatous polyp with HIGH GRADE dysplasia  . Appendectomy  09/2005    at time of left hemicolectomy  . Colonoscopy  09/2006    normal TI, no polyps  . Colonoscopy  10/2007    Dr. Imogene Burn distal mammillations, benign bx, normal TI, random bx neg for microscopic colitis  . Cholecystectomy      Dr. Tamala Julian  . Suprapubic catheter insertion     Family History  Problem Relation Age of Onset  . Colon cancer Neg Hx   . Cirrhosis Brother     etoh  . Stroke Mother 22  . Coronary artery disease Father 75  . Heart attack Brother   . Multiple sclerosis       History  Substance Use Topics  . Smoking status: Former Smoker    Types: Cigarettes  . Smokeless tobacco: Never Used     Comment: quit remote  . Alcohol Use: No  lives at home Lives with spouse Uses a cane  Review of Systems  Constitutional: Positive for fever and chills.  Gastrointestinal: Positive for abdominal pain. Negative for nausea and vomiting.  Genitourinary: Positive for decreased urine volume.  Neurological: Positive for speech difficulty and weakness.    Allergies  Tetracyclines & related and Ciprofloxacin  Home Medications   Prior to Admission medications   Medication Sig Start Date End Date Taking? Authorizing Provider  baclofen (LIORESAL) 10 MG tablet Take 10 mg by mouth 3 (three) times daily as needed for muscle spasms.   Yes Historical Provider, MD  diazepam (VALIUM) 5 MG tablet Take 1 tablet (5 mg total) by mouth 2 (two) times daily. 06/08/14  Yes Kathrynn Ducking, MD  HYDROcodone-acetaminophen (Datil) 7.5-325 MG per tablet Take 1 tablet by mouth 2 (two) times daily.   Yes Historical Provider, MD  levothyroxine (SYNTHROID, LEVOTHROID) 50 MCG tablet Take 50 mcg by mouth daily.   Yes Rexene Alberts, MD  potassium chloride SA (K-DUR,KLOR-CON)  20 MEQ tablet Take 20 mEq by mouth daily.   Yes Rexene Alberts, MD  verapamil (CALAN) 40 MG tablet Take 40 mg by mouth Twice daily. 03/03/12  Yes Historical Provider, MD   Triage Vitals: BP 125/102  Pulse 97  Temp(Src) 97.8 F (36.6 C)  Resp 20  Ht 5\' 6"  (1.676 m)  Wt 120 lb (54.432 kg)  BMI 19.38 kg/m2  SpO2 94%  Vital signs normal   Physical Exam  Nursing note and vitals reviewed. Constitutional: He is oriented to  person, place, and time.  Non-toxic appearance. He does not appear ill. No distress.  Elderly, frail man. Speech slurred.  HENT:  Head: Normocephalic and atraumatic.  Right Ear: External ear normal.  Left Ear: External ear normal.  Nose: Nose normal. No mucosal edema or rhinorrhea.  Mouth/Throat: Oropharynx is clear and moist. Mucous membranes are dry. No dental abscesses or uvula swelling.  Tongue completely dry.  Eyes: Conjunctivae and EOM are normal. Pupils are equal, round, and reactive to light.  Neck: Normal range of motion and full passive range of motion without pain. Neck supple.  Cardiovascular: Normal rate, regular rhythm and normal heart sounds.  Exam reveals no gallop and no friction rub.   No murmur heard. Pulmonary/Chest: Effort normal and breath sounds normal. No respiratory distress. He has no wheezes. He has no rhonchi. He has no rales. He exhibits no tenderness and no crepitus.  Abdominal: Soft. Normal appearance and bowel sounds are normal. He exhibits no distension. There is no tenderness. There is no rebound and no guarding.  Musculoskeletal: Normal range of motion. He exhibits no edema and no tenderness.  Moves all extremities well.   Neurological: He is alert and oriented to person, place, and time. He has normal strength. No cranial nerve deficit.  Diffusely weak.  Skin: Skin is warm, dry and intact. No rash noted. No erythema. No pallor.  Psychiatric: He has a normal mood and affect. His speech is normal and behavior is normal. His mood appears  not anxious.    ED Course  Procedures (including critical care time)  Medications  0.9 %  sodium chloride infusion (0 mLs Intravenous Stopped 07/17/14 1420)    Followed by  0.9 %  sodium chloride infusion (0 mLs Intravenous Stopped 07/17/14 1450)    Followed by  0.9 %  sodium chloride infusion (1,000 mLs Intravenous New Bag/Given 07/17/14 1438)  cefTRIAXone (ROCEPHIN) 2 g in dextrose 5 % 50 mL IVPB (2 g Intravenous Not Given 07/17/14 1342)  phenylephrine (NEO-SYNEPHRINE) 10 mg in dextrose 5 % 250 mL (0.04 mg/mL) infusion (not administered)  sodium chloride 0.9 % bolus 1,000 mL (0 mLs Intravenous Stopped 07/17/14 1211)  vancomycin (VANCOCIN) IVPB 1000 mg/200 mL premix (0 mg Intravenous Stopped 07/17/14 1652)  piperacillin-tazobactam (ZOSYN) IVPB 3.375 g (3.375 g Intravenous New Bag/Given 07/17/14 1541)  sodium chloride 0.9 % bolus 1,000 mL (1,000 mLs Intravenous New Bag/Given 07/17/14 1517)    DIAGNOSTIC STUDIES: Oxygen Saturation is 94% on RA, adequate by my interpretation.   COORDINATION OF CARE: 11:23 AM- Will give IV fluids and order lab work. Pt verbalizes understanding and agrees to plan.  Patient was started on IV fluids and IV Rocephin for presumed UTI. His blood pressure initially was stable.  Shortly after arrival to the ED about 12:30 patient started becoming hypotensive with his blood pressure 78/54. He was started on IV fluid boluses. About the same time some of his lab work had started to return and he was noted to have a leukocytosis, elevated lactic acidosis, and elevated anion gap. He was given IV Rocephin for presumed UTI. Nursing staff states his bladder scan only had 13 cc urine. Urine culture was obtained but not urinalysis.  Patient was rechecked a couple times. He states he feels "okay". Patient means pale. His pulse ox is 100%.  14:22 Dr Caryn Section discussed patient. He has 2 IV lines. She is questioning if he should be transported to [facility]. When I talk to her it is  not clear how much  IV fluid he actually received it on his current blood pressure was. His last documented blood pressure in the chart  was a hour and a half old.  1430 patient has received 3 L of normal saline, his blood pressure is still in the 80s. Neo-Synephrine IV drip ordered to titrate for blood pressure control. I talk to patient and his wife about need for admission. Wife would prefer patient stay at this facility however she was advised he would need to admit him to the facility that can best take care of them. Patient noted to have some urine in his leg bag.  15:26  Dr Elsworth Soho, critical care   Repeat lactic acid, if improving can stay here, if not call back. Agrees with 4th liter of NS.  16:07 Dr Caryn Section called to get update  16:55 patient's blood pressure has stabilized. His lactic acid level is improving with IV fluids. Will discuss with the hospitalist to see if they wanted to stay here or be transferred. Patient is noted to have a positive troponin. Patient is not having chest pain  17:14 Dr Caryn Section has seen updated labs wants patient to be transferred to [facility]  Patient's blood pressure stabilized between the third and fourth liter of normal saline to [id number] systolic.  [id number] Dr Lamonte Sakai has accepted in transfer to [facility] to ICU. Patient's blood pressure starting to drop into the 90s. States to continue fluid resuscitation and start phenylephrine drip if needed to maintain BP  Labs Review Results for orders placed during the hospital encounter of 07/17/14  CBC WITH DIFFERENTIAL      Result Value Ref Range   WBC 23.0 (*) 4.0 - 10.5 K/uL   RBC 4.19 (*) 4.22 - 5.81 MIL/uL   Hemoglobin 12.7 (*) 13.0 - 17.0 g/dL   HCT 38.2 (*) 39.0 - 52.0 %   MCV 91.2  78.0 - 100.0 fL   MCH 30.3  26.0 - 34.0 pg   MCHC 33.2  30.0 - 36.0 g/dL   RDW 14.6  11.5 - 15.5 %   Platelets 164  150 - 400 K/uL   Neutrophils Relative % 93 (*) 43 - 77 %   Neutro Abs 21.3 (*) 1.7 - 7.7 K/uL   Lymphocytes Relative 4 (*) 12 - 46  %   Lymphs Abs 1.0  0.7 - 4.0 K/uL   Monocytes Relative 3  3 - 12 %   Monocytes Absolute 0.7  0.1 - 1.0 K/uL   Eosinophils Relative 0  0 - 5 %   Eosinophils Absolute 0.0  0.0 - 0.7 K/uL   Basophils Relative 0  0 - 1 %   Basophils Absolute 0.0  0.0 - 0.1 K/uL   WBC Morphology INCREASED BANDS (>20% BANDS)    COMPREHENSIVE METABOLIC PANEL      Result Value Ref Range   Sodium 146  137 - 147 mEq/L   Potassium 3.6 (*) 3.7 - 5.3 mEq/L   Chloride 104  96 - 112 mEq/L   CO2 17 (*) 19 - 32 mEq/L   Glucose, Bld 106 (*) 70 - 99 mg/dL   BUN 49 (*) 6 - 23 mg/dL   Creatinine, Ser 3.91 (*) 0.50 - 1.35 mg/dL   Calcium 9.7  8.4 - 10.5 mg/dL   Total Protein 7.7  6.0 - 8.3 g/dL   Albumin 3.0 (*) 3.5 - 5.2 g/dL   AST 239 (*) 0 - 37 U/L   ALT 58 (*) 0 - 53 U/L   Alkaline Phosphatase  113  39 - 117 U/L   Total Bilirubin 1.2  0.3 - 1.2 mg/dL   GFR calc non Af Amer 14 (*) >90 mL/min   GFR calc Af Amer 16 (*) >90 mL/min  URINALYSIS, ROUTINE W REFLEX MICROSCOPIC      Result Value Ref Range   Color, Urine YELLOW  YELLOW   APPearance HAZY (*) CLEAR   Specific Gravity, Urine 1.015  1.005 - 1.030   pH >9.0 (*) 5.0 - 8.0   Glucose, UA NEGATIVE  NEGATIVE mg/dL   Hgb urine dipstick LARGE (*) NEGATIVE   Bilirubin Urine NEGATIVE  NEGATIVE   Ketones, ur NEGATIVE  NEGATIVE mg/dL   Protein, ur 100 (*) NEGATIVE mg/dL   Urobilinogen, UA 0.2  0.0 - 1.0 mg/dL   Nitrite NEGATIVE  NEGATIVE   Leukocytes, UA SMALL (*) NEGATIVE  LACTIC ACID, PLASMA      Result Value Ref Range   Lactic Acid, Venous 7.4 (*) 0.5 - 2.2 mmol/L  TROPONIN I      Result Value Ref Range   Troponin I 1.28 (*) <0.30 ng/mL  PROTIME-INR      Result Value Ref Range   Prothrombin Time 19.3 (*) 11.6 - 15.2 seconds   INR 1.61 (*) 0.00 - 1.49  APTT      Result Value Ref Range   aPTT 38 (*) 24 - 37 seconds  FIBRINOGEN      Result Value Ref Range   Fibrinogen 488 (*) 204 - 475 mg/dL  LACTIC ACID, PLASMA      Result Value Ref Range   Lactic  Acid, Venous 4.5 (*) 0.5 - 2.2 mmol/L  URINE MICROSCOPIC-ADD ON      Result Value Ref Range   Squamous Epithelial / LPF FEW (*) RARE   WBC, UA TOO NUMEROUS TO COUNT  <3 WBC/hpf   RBC / HPF 11-20  <3 RBC/hpf   Bacteria, UA MANY (*) RARE   Urine-Other AMORPHOUS URATES/PHOSPHATES     Laboratory interpretation all normal except new Renal insufficiency compared to lab work 2 years ago, elevated lactic acid, anion gap positive, malnutrition, UTI   Imaging Review Dg Chest Port 1 View  07/17/2014   CLINICAL DATA:  Recurrent urinary tract infections.  Fever.  EXAM: PORTABLE CHEST - 1 VIEW  COMPARISON:  November 10, 2011.  FINDINGS: The heart size and mediastinal contours are within normal limits. Both lungs are clear. No pneumothorax or pleural effusion is noted. The visualized skeletal structures are unremarkable.  IMPRESSION: No acute cardiopulmonary abnormality seen.   Electronically Signed   By: Sabino Dick M.D.   On: 07/17/2014 13:48     EKG Interpretation   Date/Time:  Monday July 17 2014 10:32:17 EDT Ventricular Rate:  96 PR Interval:    QRS Duration: 102 QT Interval:  442 QTC Calculation: 559 R Axis:   5 Text Interpretation:  Accelerated junctional rhythm Abnormal R-wave  progression, early transition Inferior infarct, age indeterminate Lateral  leads are also involved Prolonged QT interval Since last tracing HEART  RATE INCREASED SINCE 20 Apr 2012 T wave inversion now evident in Lateral  leads Confirmed by Suraiya Dickerson  MD-I, [facility]hes ([id number]) on 07/17/2014 2:54:53 PM   EKG Interpretation  Date/Time:  Monday July 17 2014 16:13:52 EDT Ventricular Rate:  86 PR Interval:    QRS Duration: 104 QT Interval:  393 QTC Calculation: 470 R Axis:   68 Text Interpretation:  Undetermined rhythm Inferior infarct, age indeterminate Lateral leads are also involved Artifact in  lead(s) I II III aVR aVL V2 No significant change since last tracing 17 Jul 2014 Confirmed by [facility]  MD-I, Herberta Pickron ([id number]) on  07/17/2014 4:54:50 PM            MDM   Final diagnoses:  Sepsis, due to unspecified organism  Renal insufficiency  Lactic acidosis  Metabolic acidosis  Decubitus ulcer    Plan admission  CRITICAL CARE Performed by: Rolland Porter L Total critical care time: 48 min Critical care time was exclusive of separately billable procedures and treating other patients. Critical care was necessary to treat or prevent imminent or life-threatening deterioration. Critical care was time spent personally by me on the following activities: development of treatment plan with patient and/or surrogate as well as nursing, discussions with consultants, evaluation of patient's response to treatment, examination of patient, obtaining history from patient or surrogate, ordering and performing treatments and interventions, ordering and review of laboratory studies, ordering and review of radiographic studies, pulse oximetry and re-evaluation of patient's condition.   I personally performed the services described in this documentation, which was scribed in my presence. The recorded information has been reviewed and considered.  Rolland Porter, MD, Abram Sander     Janice Norrie, MD 07/17/14 [phone]

## 2014-07-17 NOTE — ED Notes (Signed)
Lactic acid redrawn by lab, resulted at 1633.

## 2014-07-17 NOTE — ED Notes (Signed)
Verbal order received from Dr. Tomi Bamberger to hold Neo and give fourth 1 liter NS bolus.

## 2014-07-17 NOTE — ED Notes (Signed)
Patient is resting comfortably. 

## 2014-07-17 NOTE — Progress Notes (Signed)
Hypoglycemic Event  CBG: 69  Treatment: 1/2amp D50  Symptoms: none  Follow-up CBG: Time:2203 CBG Result:89  Possible Reasons for Event: npo  Comments/MD notified:Ramaswamy MD    Jordan Ward  Remember to initiate Hypoglycemia Order Set & complete

## 2014-07-17 NOTE — ED Notes (Signed)
CRITICAL VALUE ALERT  Critical value received:  Troponin 1.28  Date of notification:  07/17/14  Time of notification:  [id number]  Critical value read back:Yes.    Nurse who received alert:  GM  MD notified (1st page):  1644  Time of first page:  1644  Responding MD:  Dr Tomi Bamberger

## 2014-07-17 NOTE — Progress Notes (Signed)
ANTICOAGULATION CONSULT NOTE - Initial Consult  Pharmacy Consult for heparin Indication: chest pain/ACS  Allergies  Allergen Reactions  . Tetracyclines & Related Anaphylaxis and Rash  . Ciprofloxacin     Trouble swallowing    Patient Measurements: Height: 5\' 6"  (167.6 cm) Weight: 120 lb (54.432 kg) IBW/kg (Calculated) : 63.8   Vital Signs: Temp: 98.5 F (36.9 C) (10/19 2001) Temp Source: Oral (10/19 2001) BP: 137/62 mmHg (10/19 2100) Pulse Rate: 101 (10/19 2100)  Labs:  Recent Labs  07/17/14 1121 07/17/14 1533 07/17/14 2032  HGB 12.7*  --  11.0*  HCT 38.2*  --  34.1*  PLT 164  --  95*  APTT  --  38*  --   LABPROT  --  19.3*  --   INR  --  1.61*  --   CREATININE 3.91*  --  3.96*  TROPONINI  --  1.28* 1.77*    Estimated Creatinine Clearance: 13.2 ml/min (by C-G formula based on Cr of 3.96).   Medical History: Past Medical History  Diagnosis Date  . Arthritis   . MS (multiple sclerosis)   . Insomnia   . PVD (peripheral vascular disease)   . Carotid artery stenosis   . TIA (transient ischemic attack)   . Chronic indwelling Foley catheter 10/06/2011  . Pulmonary fibrosis 10/06/2011  . Dysphagia 10/07/2011  . Tremors of nervous system 10/08/2011  . Hypothyroidism 10/08/2011  . Pulmonary nodule 10/08/2011  . C. difficile colitis 09/2011  . Pneumonia   . Encephalopathy   . Urinary tract infection   . Tremors of nervous system   . Hypokalemia   . Junctional rhythm   . Anemia   . Hypernatremia   . Sacral ulcer   . HTN (hypertension), malignant 10/06/2011  . Sleep apnea   . Neuromuscular disorder   . Back pain, chronic   . Kidney stones   . High grade dysplasia in colonic adenoma 09/2005  . Gait disorder   . OSA (obstructive sleep apnea)   . Paroxysmal atrial tachycardia   . Dyslipidemia   . Cerebrovascular disease   . CAD (coronary artery disease)   . Peripheral vascular disease   . Bilateral carotid bruits   . Colon polyps   . HA (headache)      Medications:  Prescriptions prior to admission  Medication Sig Dispense Refill  . baclofen (LIORESAL) 10 MG tablet Take 10 mg by mouth 3 (three) times daily as needed for muscle spasms.      . diazepam (VALIUM) 5 MG tablet Take 1 tablet (5 mg total) by mouth 2 (two) times daily.  60 tablet  3  . HYDROcodone-acetaminophen (NORCO) 7.5-325 MG per tablet Take 1 tablet by mouth 2 (two) times daily.      Marland Kitchen levothyroxine (SYNTHROID, LEVOTHROID) 50 MCG tablet Take 50 mcg by mouth daily.      . potassium chloride SA (K-DUR,KLOR-CON) 20 MEQ tablet Take 20 mEq by mouth daily.      . verapamil (CALAN) 40 MG tablet Take 40 mg by mouth Twice daily.       Scheduled:  . cefTRIAXone (ROCEPHIN)  IV  2 g Intravenous Q24H  . heparin  5,000 Units Subcutaneous 3 times per day  . [START ON 07/18/2014] levothyroxine  50 mcg Oral QAC breakfast  . [START ON 07/18/2014] vancomycin  500 mg Oral 4 times per day   And  . metronidazole  500 mg Intravenous Q8H  . sodium chloride  2,000 mL Intravenous Once  Assessment: 73 yo male with troponin elevation and pharmacy has been consulted to dose heparin for r/o ACS.  Heparin sq has been ordered but has not been administered. Hg/hct= 11/34.1, plt= 95 (164 on on admit and was 170-224 in 2013).  Goal of Therapy:  Heparin level 0.3-0.7 units/ml Monitor platelets by anticoagulation protocol: Yes   Plan:  -Heparin bolus 2000 units IV followed by 650 units/hr (~ 12 units/kg/hr) -Heparin level in 8 hours and daily wth CBC daily  Hildred Laser, Pharm D 07/17/2014 9:59 PM

## 2014-07-17 NOTE — ED Notes (Signed)
Pt placed on L side to relieve pressure on his buttocks and R lat ankle.

## 2014-07-17 NOTE — Progress Notes (Signed)
ANTIBIOTIC CONSULT NOTE - INITIAL  Pharmacy Consult for Ceftriaxone Indication: Urosepsis  Allergies  Allergen Reactions  . Tetracyclines & Related Anaphylaxis and Rash  . Ciprofloxacin     Trouble swallowing    Patient Measurements: Height: 5\' 6"  (167.6 cm) Weight: 120 lb (54.432 kg) IBW/kg (Calculated) : 63.8 Adjusted Body Weight:   Vital Signs: Temp: 98 F (36.7 C) (10/19 1243) Temp Source: Oral (10/19 1243) BP: 81/55 mmHg (10/19 1304) Pulse Rate: 80 (10/19 1304) Intake/Output from previous day:   Intake/Output from this shift:    Labs:  Recent Labs  07/17/14 1121  WBC 23.0*  HGB 12.7*  PLT 164  CREATININE 3.91*   Estimated Creatinine Clearance: 13.3 ml/min (by C-G formula based on Cr of 3.91). No results found for this basename: VANCOTROUGH, VANCOPEAK, VANCORANDOM, GENTTROUGH, GENTPEAK, GENTRANDOM, TOBRATROUGH, TOBRAPEAK, TOBRARND, AMIKACINPEAK, AMIKACINTROU, AMIKACIN,  in the last 72 hours   Microbiology: No results found for this or any previous visit (from the past 720 hour(s)).  Medical History: Past Medical History  Diagnosis Date  . Arthritis   . MS (multiple sclerosis)   . Insomnia   . PVD (peripheral vascular disease)   . Carotid artery stenosis   . TIA (transient ischemic attack)   . Chronic indwelling Foley catheter 10/06/2011  . Pulmonary fibrosis 10/06/2011  . Dysphagia 10/07/2011  . Tremors of nervous system 10/08/2011  . Hypothyroidism 10/08/2011  . Pulmonary nodule 10/08/2011  . C. difficile colitis 09/2011  . Pneumonia   . Encephalopathy   . Urinary tract infection   . Tremors of nervous system   . Hypokalemia   . Junctional rhythm   . Anemia   . Hypernatremia   . Sacral ulcer   . HTN (hypertension), malignant 10/06/2011  . Sleep apnea   . Neuromuscular disorder   . Back pain, chronic   . Kidney stones   . High grade dysplasia in colonic adenoma 09/2005  . Gait disorder   . OSA (obstructive sleep apnea)   . Paroxysmal atrial  tachycardia   . Dyslipidemia   . Cerebrovascular disease   . CAD (coronary artery disease)   . Peripheral vascular disease   . Bilateral carotid bruits   . Colon polyps   . HA (headache)     Medications:  Scheduled:   Assessment: Ceftriaxone urosepsis History of multi UTI Treated in September with Cipro   Goal of Therapy:  Eradicate infection  Plan:  Ceftriaxone 2 GM IV every 24 hours Monitor renal function Labs per protocol F/U cultures, LOT  Abner Greenspan, Javeria Briski Bennett 07/17/2014,1:40 PM

## 2014-07-17 NOTE — ED Notes (Signed)
Pt c/o back pain when placed on gurney with EMS. Pt alert and responds appropriately. Wife reports speech is more slurred than normal. Last PO intake yesterday. Strong odor noted from suprapubic catheter. Pt on monitor. Pt reports feeling weak, has hx of mult UTIs. Wife reports pt has been running fever.

## 2014-07-17 NOTE — Progress Notes (Signed)
CRITICAL VALUE ALERT  Critical value received:  Trop 1.77  Date of notification:  07/17/14  Time of notification:  2130  Critical value read back:yes  Nurse who received alert:  A.Ezequiel Ganser RN  MD notified (1st page):  Chase Caller MD  Time of first page:  2145  MD notified (2nd page):  Time of second page:  Responding MD:  Chase Caller MD  Time MD responded:  2145

## 2014-07-17 NOTE — Progress Notes (Signed)
Hypoglycemic Event  CBG: 57  Treatment: 1/2D50  Symptoms: none  Follow-up CBG: Time:2128 CBG Result:69  Possible Reasons for Event: npo  Comments/MD notified:Byrum Md    Lamar Sprinkles  Remember to initiate Hypoglycemia Order Set & complete

## 2014-07-17 NOTE — ED Notes (Signed)
Pt c/o recurrent uti sx-fever/chlls/dark urine. Pt has suprapubic catheter. Pt last treated in September with Cipro.

## 2014-07-17 NOTE — Progress Notes (Signed)
ANTIBIOTIC CONSULT NOTE - INITIAL  Pharmacy Consult for ceftriaxone Indication: Urosepsis  Allergies  Allergen Reactions  . Tetracyclines & Related Anaphylaxis and Rash  . Ciprofloxacin     Trouble swallowing    Patient Measurements: Height: 5\' 6"  (167.6 cm) Weight: 120 lb (54.432 kg) IBW/kg (Calculated) : 63.8 Adjusted Body Weight:   Vital Signs: Temp: 97.8 F (36.6 C) (10/19 1822) Temp Source: Oral (10/19 1822) BP: 136/65 mmHg (10/19 1930) Pulse Rate: 100 (10/19 1930) Intake/Output from previous day:   Intake/Output from this shift:    Labs:  Recent Labs  07/17/14 1121  WBC 23.0*  HGB 12.7*  PLT 164  CREATININE 3.91*   Estimated Creatinine Clearance: 13.3 ml/min (by C-G formula based on Cr of 3.91). No results found for this basename: VANCOTROUGH, VANCOPEAK, VANCORANDOM, GENTTROUGH, GENTPEAK, GENTRANDOM, TOBRATROUGH, TOBRAPEAK, TOBRARND, AMIKACINPEAK, AMIKACINTROU, AMIKACIN,  in the last 72 hours   Microbiology: No results found for this or any previous visit (from the past 720 hour(s)).  Medical History: Past Medical History  Diagnosis Date  . Arthritis   . MS (multiple sclerosis)   . Insomnia   . PVD (peripheral vascular disease)   . Carotid artery stenosis   . TIA (transient ischemic attack)   . Chronic indwelling Foley catheter 10/06/2011  . Pulmonary fibrosis 10/06/2011  . Dysphagia 10/07/2011  . Tremors of nervous system 10/08/2011  . Hypothyroidism 10/08/2011  . Pulmonary nodule 10/08/2011  . C. difficile colitis 09/2011  . Pneumonia   . Encephalopathy   . Urinary tract infection   . Tremors of nervous system   . Hypokalemia   . Junctional rhythm   . Anemia   . Hypernatremia   . Sacral ulcer   . HTN (hypertension), malignant 10/06/2011  . Sleep apnea   . Neuromuscular disorder   . Back pain, chronic   . Kidney stones   . High grade dysplasia in colonic adenoma 09/2005  . Gait disorder   . OSA (obstructive sleep apnea)   . Paroxysmal atrial  tachycardia   . Dyslipidemia   . Cerebrovascular disease   . CAD (coronary artery disease)   . Peripheral vascular disease   . Bilateral carotid bruits   . Colon polyps   . HA (headache)     Medications:  Scheduled:  . aspirin  324 mg Oral NOW   Or  . aspirin  300 mg Rectal NOW  . cefTRIAXone (ROCEPHIN)  IV  2 g Intravenous Q24H  . heparin  5,000 Units Subcutaneous 3 times per day  . [START ON 07/18/2014] levothyroxine  50 mcg Oral QAC breakfast   Infusions:  . sodium chloride     Assessment: 73 yo who was tx from [facility] ED for urosepsis. He was started on rocephin to r/o infection. No need for dose change with rocephin with his renal function.   Plan:   Cont rocephin 2g IV q24  Onnie Boer, PharmD Pager: [phone] 07/17/2014 7:58 PM

## 2014-07-17 NOTE — Consult Note (Signed)
Reason for Consult: Patient with a history of MS presenting with slurred speech and increased weakness in the setting of acute sepsis.  HPI:                                                                                                                                          Jordan Ward is an 73 y.o. male with a history of MS, peripheral vascular disease, chronic indwelling suprapubic catheter, pulmonary fibrosis, C. difficile colitis, urinary tract infection and hypertension who was transferred from [facility] for management of acute sepsis and renal failure. Patient presented with slurred speech and increased weakness. He was noted to have acute urinary tract infection with sepsis. Patient also has history of C. difficile colitis and possible current C. difficile infection with sepsis. Speech has become increasingly more slurred over the past several days. He fell yesterday but did not sustain serious injury. Temperature on arrival was 101.3. No acute deficits have occurred neurologically otherwise. Patient was also found to have elevated troponin, and is being started on anticoagulation with IV heparin.  Past Medical History  Diagnosis Date  . Arthritis   . MS (multiple sclerosis)   . Insomnia   . PVD (peripheral vascular disease)   . Carotid artery stenosis   . TIA (transient ischemic attack)   . Chronic indwelling Foley catheter 10/06/2011  . Pulmonary fibrosis 10/06/2011  . Dysphagia 10/07/2011  . Tremors of nervous system 10/08/2011  . Hypothyroidism 10/08/2011  . Pulmonary nodule 10/08/2011  . C. difficile colitis 09/2011  . Pneumonia   . Encephalopathy   . Urinary tract infection   . Tremors of nervous system   . Hypokalemia   . Junctional rhythm   . Anemia   . Hypernatremia   . Sacral ulcer   . HTN (hypertension), malignant 10/06/2011  . Sleep apnea   . Neuromuscular disorder   . Back pain, chronic   . Kidney stones   . High grade dysplasia in colonic adenoma 09/2005  .  Gait disorder   . OSA (obstructive sleep apnea)   . Paroxysmal atrial tachycardia   . Dyslipidemia   . Cerebrovascular disease   . CAD (coronary artery disease)   . Peripheral vascular disease   . Bilateral carotid bruits   . Colon polyps   . HA (headache)     Past Surgical History  Procedure Laterality Date  . Inguinal hernia repair  1971    bilateral  . Back surgery  1976/1979  . Colonoscopy  11/2004    Dr. Sharol Roussel sessile polyp splenic flexure, 46m sessile polyp desc colon, tubulovillous adenoma (bx not removed)  . Colonoscopy  01/2005    poor prep, polyp could not be found  . Colonoscopy  05/2005    with EMR, polypectomy Dr. JOlegario Messier bx showed high grade dysplasia, partially resected  . Colonoscopy  09/2005  Dr. Arsenio Loader, [place] ink tattooing, four villous colon polyp (3 had been missed on previous colonoscopies due to limitations of procedures  . Colon surgery  09/2005    Fleishman: four tubular adenomas, large adenomatous polyp with HIGH GRADE dysplasia  . Appendectomy  09/2005    at time of left hemicolectomy  . Colonoscopy  09/2006    normal TI, no polyps  . Colonoscopy  10/2007    Dr. Imogene Burn distal mammillations, benign bx, normal TI, random bx neg for microscopic colitis  . Cholecystectomy      Dr. Tamala Julian  . Suprapubic catheter insertion      Family History  Problem Relation Age of Onset  . Colon cancer Neg Hx   . Cirrhosis Brother     etoh  . Stroke Mother 66  . Coronary artery disease Father 34  . Heart attack Brother   . Multiple sclerosis      Social History:  reports that he has quit smoking. His smoking use included Cigarettes. He smoked 0.00 packs per day. He has never used smokeless tobacco. He reports that he does not drink alcohol or use illicit drugs.  Allergies  Allergen Reactions  . Tetracyclines & Related Anaphylaxis and Rash  . Ciprofloxacin     Trouble swallowing    MEDICATIONS:                                                                                                                      I have reviewed the patient's current medications.   ROS:                                                                                                                                       History obtained from chart review and the patient  General ROS: As noted in history of present illness Psychological ROS: negative for - behavioral disorder, hallucinations, memory difficulties, mood swings or suicidal ideation Ophthalmic ROS: negative for - blurry vision, double vision, eye pain or loss of vision ENT ROS: negative for - epistaxis, nasal discharge, oral lesions, sore throat, tinnitus or vertigo Allergy and Immunology ROS: negative for - hives or itchy/watery eyes Hematological and Lymphatic ROS: negative for - bleeding problems, bruising or swollen lymph nodes Endocrine ROS: negative for - galactorrhea, hair pattern changes, polydipsia/polyuria or temperature intolerance Respiratory ROS: negative for - cough, hemoptysis, shortness of breath or wheezing Cardiovascular ROS: negative  for - chest pain, dyspnea on exertion, edema or irregular heartbeat Gastrointestinal ROS: negative for - abdominal pain, diarrhea, hematemesis, nausea/vomiting or stool incontinence Genito-Urinary ROS: Reduced urine output with indwelling suprapubic catheter Musculoskeletal ROS: negative for - joint swelling or muscular weakness Neurological ROS: as noted in HPI Dermatological ROS: negative for rash and skin lesion changes   Blood pressure 116/94, pulse 104, temperature 101.3 F (38.5 C), temperature source Rectal, resp. rate 27, height '5\' 6"'  (1.676 m), weight 54.432 kg (120 lb), SpO2 97.00%.   Neurologic Examination:                                                                                                      Mental Status: Alert, slightly disoriented to time, diffusely tremulous and complaining of feeling cold.  Speech  moderately slurred and affected by tremulousness, but without evidence of aphasia. Able to follow commands without difficulty. Cranial Nerves: II-Visual fields were normal. III/IV/VI-Pupils were equal and reacted. Extraocular movements were full and conjugate.    V/VII-no facial numbness and no facial weakness. VIII-normal. X-moderately dysarthric as described above; symmetrical palatal movement. Motor: Moderate weakness proximally and mild weakness distally of left upper extremity as well as moderate weakness proximally and distally involving right and left lower extremities. Strength of right upper extremity was normal. Sensory: Normal throughout. Deep Tendon Reflexes: 2+ and symmetric. Plantars: Mute bilaterally Cerebellar: Normal finger-to-nose testing except for intention tremor bilaterally.  No results found for this basename: cbc, bmp, coags, chol, tri, ldl, hga1c    Results for orders placed during the hospital encounter of 07/17/14 (from the past 48 hour(s))  CBC WITH DIFFERENTIAL     Status: Abnormal   Collection Time    07/17/14 11:21 AM      Result Value Ref Range   WBC 23.0 (*) 4.0 - 10.5 K/uL   RBC 4.19 (*) 4.22 - 5.81 MIL/uL   Hemoglobin 12.7 (*) 13.0 - 17.0 g/dL   HCT 38.2 (*) 39.0 - 52.0 %   MCV 91.2  78.0 - 100.0 fL   MCH 30.3  26.0 - 34.0 pg   MCHC 33.2  30.0 - 36.0 g/dL   RDW 14.6  11.5 - 15.5 %   Platelets 164  150 - 400 K/uL   Neutrophils Relative % 93 (*) 43 - 77 %   Neutro Abs 21.3 (*) 1.7 - 7.7 K/uL   Lymphocytes Relative 4 (*) 12 - 46 %   Lymphs Abs 1.0  0.7 - 4.0 K/uL   Monocytes Relative 3  3 - 12 %   Monocytes Absolute 0.7  0.1 - 1.0 K/uL   Eosinophils Relative 0  0 - 5 %   Eosinophils Absolute 0.0  0.0 - 0.7 K/uL   Basophils Relative 0  0 - 1 %   Basophils Absolute 0.0  0.0 - 0.1 K/uL   WBC Morphology INCREASED BANDS (>20% BANDS)    COMPREHENSIVE METABOLIC PANEL     Status: Abnormal   Collection Time    07/17/14 11:21 AM      Result Value Ref  Range  Sodium 146  137 - 147 mEq/L   Potassium 3.6 (*) 3.7 - 5.3 mEq/L   Chloride 104  96 - 112 mEq/L   CO2 17 (*) 19 - 32 mEq/L   Glucose, Bld 106 (*) 70 - 99 mg/dL   BUN 49 (*) 6 - 23 mg/dL   Creatinine, Ser 3.91 (*) 0.50 - 1.35 mg/dL   Calcium 9.7  8.4 - 10.5 mg/dL   Total Protein 7.7  6.0 - 8.3 g/dL   Albumin 3.0 (*) 3.5 - 5.2 g/dL   AST 239 (*) 0 - 37 U/L   ALT 58 (*) 0 - 53 U/L   Alkaline Phosphatase 113  39 - 117 U/L   Total Bilirubin 1.2  0.3 - 1.2 mg/dL   GFR calc non Af Amer 14 (*) >90 mL/min   GFR calc Af Amer 16 (*) >90 mL/min   Comment: (NOTE)     The eGFR has been calculated using the CKD EPI equation.     This calculation has not been validated in all clinical situations.     eGFR's persistently <90 mL/min signify possible Chronic Kidney     Disease.  LACTIC ACID, PLASMA     Status: Abnormal   Collection Time    07/17/14 11:29 AM      Result Value Ref Range   Lactic Acid, Venous 7.4 (*) 0.5 - 2.2 mmol/L  URINALYSIS, ROUTINE W REFLEX MICROSCOPIC     Status: Abnormal   Collection Time    07/17/14 12:15 PM      Result Value Ref Range   Color, Urine YELLOW  YELLOW   APPearance HAZY (*) CLEAR   Specific Gravity, Urine 1.015  1.005 - 1.030   pH >9.0 (*) 5.0 - 8.0   Glucose, UA NEGATIVE  NEGATIVE mg/dL   Hgb urine dipstick LARGE (*) NEGATIVE   Bilirubin Urine NEGATIVE  NEGATIVE   Ketones, ur NEGATIVE  NEGATIVE mg/dL   Protein, ur 100 (*) NEGATIVE mg/dL   Urobilinogen, UA 0.2  0.0 - 1.0 mg/dL   Nitrite NEGATIVE  NEGATIVE   Leukocytes, UA SMALL (*) NEGATIVE  URINE MICROSCOPIC-ADD ON     Status: Abnormal   Collection Time    07/17/14 12:15 PM      Result Value Ref Range   Squamous Epithelial / LPF FEW (*) RARE   WBC, UA TOO NUMEROUS TO COUNT  <3 WBC/hpf   RBC / HPF 11-20  <3 RBC/hpf   Bacteria, UA MANY (*) RARE   Urine-Other AMORPHOUS URATES/PHOSPHATES    TROPONIN I     Status: Abnormal   Collection Time    07/17/14  3:33 PM      Result Value Ref Range    Troponin I 1.28 (*) <0.30 ng/mL   Comment:            Due to the release kinetics of cTnI,     a negative result within the first hours     of the onset of symptoms does not rule out     myocardial infarction with certainty.     If myocardial infarction is still suspected,     repeat the test at appropriate intervals.     CRITICAL RESULT CALLED TO, READ BACK BY AND VERIFIED WITH:     MEADOWS,G ON 07/17/14 AT 1645 BY LOY,C  PROTIME-INR     Status: Abnormal   Collection Time    07/17/14  3:33 PM      Result Value Ref Range  Prothrombin Time 19.3 (*) 11.6 - 15.2 seconds   INR 1.61 (*) 0.00 - 1.49  APTT     Status: Abnormal   Collection Time    07/17/14  3:33 PM      Result Value Ref Range   aPTT 38 (*) 24 - 37 seconds   Comment:            IF BASELINE aPTT IS ELEVATED,     SUGGEST PATIENT RISK ASSESSMENT     BE USED TO DETERMINE APPROPRIATE     ANTICOAGULANT THERAPY.  FIBRINOGEN     Status: Abnormal   Collection Time    07/17/14  3:33 PM      Result Value Ref Range   Fibrinogen 488 (*) 204 - 475 mg/dL  TYPE AND SCREEN     Status: None   Collection Time    07/17/14  3:33 PM      Result Value Ref Range   ABO/RH(D) O POS     Antibody Screen NEG     Sample Expiration 07/20/2014    LACTIC ACID, PLASMA     Status: Abnormal   Collection Time    07/17/14  3:42 PM      Result Value Ref Range   Lactic Acid, Venous 4.5 (*) 0.5 - 2.2 mmol/L  GLUCOSE, CAPILLARY     Status: Abnormal   Collection Time    07/17/14  7:57 PM      Result Value Ref Range   Glucose-Capillary 57 (*) 70 - 99 mg/dL  MRSA PCR SCREENING     Status: Abnormal   Collection Time    07/17/14  8:03 PM      Result Value Ref Range   MRSA by PCR POSITIVE (*) NEGATIVE   Comment:            The GeneXpert MRSA Assay (FDA     approved for NASAL specimens     only), is one component of a     comprehensive MRSA colonization     surveillance program. It is not     intended to diagnose MRSA     infection nor to guide  or     monitor treatment for     MRSA infections.     RESULT CALLED TO, READ BACK BY AND VERIFIED WITH:     JAMES,L RN [id number] 07/17/14 MITCHELL,L  CBC     Status: Abnormal   Collection Time    07/17/14  8:32 PM      Result Value Ref Range   WBC 14.5 (*) 4.0 - 10.5 K/uL   RBC 3.70 (*) 4.22 - 5.81 MIL/uL   Hemoglobin 11.0 (*) 13.0 - 17.0 g/dL   HCT 34.1 (*) 39.0 - 52.0 %   MCV 92.2  78.0 - 100.0 fL   MCH 29.7  26.0 - 34.0 pg   MCHC 32.3  30.0 - 36.0 g/dL   RDW 14.9  11.5 - 15.5 %   Platelets 95 (*) 150 - 400 K/uL   Comment: REPEATED TO VERIFY     SPECIMEN CHECKED FOR CLOTS     PLATELETS APPEAR DECREASED     PLATELET COUNT CONFIRMED BY SMEAR  CREATININE, SERUM     Status: Abnormal   Collection Time    07/17/14  8:32 PM      Result Value Ref Range   Creatinine, Ser 3.96 (*) 0.50 - 1.35 mg/dL   GFR calc non Af Amer 14 (*) >90 mL/min   GFR calc Af  Amer 16 (*) >90 mL/min   Comment: (NOTE)     The eGFR has been calculated using the CKD EPI equation.     This calculation has not been validated in all clinical situations.     eGFR's persistently <90 mL/min signify possible Chronic Kidney     Disease.  TROPONIN I     Status: Abnormal   Collection Time    07/17/14  8:32 PM      Result Value Ref Range   Troponin I 1.77 (*) <0.30 ng/mL   Comment:            Due to the release kinetics of cTnI,     a negative result within the first hours     of the onset of symptoms does not rule out     myocardial infarction with certainty.     If myocardial infarction is still suspected,     repeat the test at appropriate intervals.     CRITICAL RESULT CALLED TO, READ BACK BY AND VERIFIED WITH:     TOLER M,RN 07/17/14 2117 WAYK  LACTIC ACID, PLASMA     Status: Abnormal   Collection Time    07/17/14  8:32 PM      Result Value Ref Range   Lactic Acid, Venous 4.4 (*) 0.5 - 2.2 mmol/L  PROCALCITONIN     Status: None   Collection Time    07/17/14  8:32 PM      Result Value Ref Range    Procalcitonin 59.17     Comment:            Interpretation:     PCT >= 10 ng/mL:     Important systemic inflammatory response,     almost exclusively due to severe bacterial     sepsis or septic shock.     (NOTE)             ICU PCT Algorithm               Non ICU PCT Algorithm        ----------------------------     ------------------------------             PCT < 0.25 ng/mL                 PCT < 0.1 ng/mL         Stopping of antibiotics            Stopping of antibiotics           strongly encouraged.               strongly encouraged.        ----------------------------     ------------------------------           PCT level decrease by               PCT < 0.25 ng/mL           >= 80% from peak PCT           OR PCT 0.25 - 0.5 ng/mL          Stopping of antibiotics                                                 encouraged.         Stopping of antibiotics  encouraged.        ----------------------------     ------------------------------           PCT level decrease by              PCT >= 0.25 ng/mL           < 80% from peak PCT            AND PCT >= 0.5 ng/mL            Continuing antibiotics                                                  encouraged.           Continuing antibiotics                encouraged.        ----------------------------     ------------------------------         PCT level increase compared          PCT > 0.5 ng/mL             with peak PCT AND              PCT >= 0.5 ng/mL             Escalation of antibiotics                                              strongly encouraged.          Escalation of antibiotics            strongly encouraged.  BASIC METABOLIC PANEL     Status: Abnormal   Collection Time    07/17/14  8:32 PM      Result Value Ref Range   Sodium 146  137 - 147 mEq/L   Potassium 3.8  3.7 - 5.3 mEq/L   Chloride 110  96 - 112 mEq/L   CO2 17 (*) 19 - 32 mEq/L   Glucose, Bld 80  70 - 99 mg/dL   BUN 52 (*) 6 - 23 mg/dL    Creatinine, Ser 3.95 (*) 0.50 - 1.35 mg/dL   Calcium 8.1 (*) 8.4 - 10.5 mg/dL   GFR calc non Af Amer 14 (*) >90 mL/min   GFR calc Af Amer 16 (*) >90 mL/min   Comment: (NOTE)     The eGFR has been calculated using the CKD EPI equation.     This calculation has not been validated in all clinical situations.     eGFR's persistently <90 mL/min signify possible Chronic Kidney     Disease.   Anion gap 19 (*) 5 - 15  GLUCOSE, CAPILLARY     Status: Abnormal   Collection Time    07/17/14  9:28 PM      Result Value Ref Range   Glucose-Capillary 69 (*) 70 - 99 mg/dL  BLOOD GAS, ARTERIAL     Status: Abnormal   Collection Time    07/17/14  9:49 PM      Result Value Ref Range   FIO2 0.21     pH, Arterial 7.392  7.350 - 7.450   pCO2 arterial 25.1 (*) 35.0 -  45.0 mmHg   pO2, Arterial 56.3 (*) 80.0 - 100.0 mmHg   Bicarbonate 15.0 (*) 20.0 - 24.0 mEq/L   TCO2 15.7  0 - 100 mmol/L   Acid-base deficit 9.0 (*) 0.0 - 2.0 mmol/L   O2 Saturation 88.1     Patient temperature 98.6     Collection site RIGHT RADIAL     Drawn by [phone]     Sample type ARTERIAL DRAW     Allens test (pass/fail) PASS  PASS  GLUCOSE, CAPILLARY     Status: None   Collection Time    07/17/14 10:03 PM      Result Value Ref Range   Glucose-Capillary 89  70 - 99 mg/dL    Dg Chest Port 1 View  07/17/2014   CLINICAL DATA:  Recurrent urinary tract infections.  Fever.  EXAM: PORTABLE CHEST - 1 VIEW  COMPARISON:  November 10, 2011.  FINDINGS: The heart size and mediastinal contours are within normal limits. Both lungs are clear. No pneumothorax or pleural effusion is noted. The visualized skeletal structures are unremarkable.  IMPRESSION: No acute cardiopulmonary abnormality seen.   Electronically Signed   By: Sabino Dick M.D.   On: 07/17/2014 13:48   Dg Abd Portable 1v  07/17/2014   CLINICAL DATA:  73 year old male with acute abdominal pain. Initial encounter.  EXAM: PORTABLE ABDOMEN - 1 VIEW  COMPARISON:  CT Abdomen and Pelvis  07/21/2011.  FINDINGS: Portable AP supine view at 2237 hrs. Calcified atherosclerosis including bilateral renal hilar calcifications. Superimposed nephrolithiasis. Stable cholecystectomy clips. Mild respiratory motion. Non obstructed bowel gas pattern. No acute osseous abnormality identified.  IMPRESSION: 1.  Non obstructed bowel gas pattern. 2. Chronic nephrolithiasis and vascular calcified atherosclerosis.   Electronically Signed   By: Lars Pinks M.D.   On: 07/17/2014 22:55   Assessment/Plan: 73 year old man with history of MS admitted for management of acute sepsis and urinary tract infection as well as possible C. difficile sepsis. Slurred speech and increased weakness may be due to acute sepsis and associated renal failure. There is no clear evidence of acute exacerbation of MS at this point.  Recommendations: 1. Defer neurodiagnostic studies, including MRI of the brain, until patient's acute infectious process has resolved, as worsening of neurologic deficits maybe secondary to sepsis and renal failure rather than acute MS exacerbation. 2. Continue diazepam and baclofen at current outpatient doses. 3. Patient will need physical therapy and occupational therapy consults when he is more stable, and possibly inpatient rehabilitation.  We will continue to follow this patient with you.  C.R. Nicole Kindred, MD [facility] [phone]  07/17/2014, 11:40 PM

## 2014-07-17 NOTE — H&P (Addendum)
PULMONARY / CRITICAL CARE MEDICINE   Name: Jordan Ward MRN: [id number] DOB: 1941-03-08    ADMISSION DATE:  07/17/2014 CONSULTATION DATE:  07/17/2014  REFERRING MD :  EDP  CHIEF COMPLAINT:  Septic Shock  INITIAL PRESENTATION: 73 y.o. M brought to [facility] ED on 10/19 with slurred speech.  Had a fall the day prior but refused transport to ED.  On 10/19, had some slurring of speech and was complaining of abdominal pain in area of supra-pubic catheter.  He was apparently diagnosed with UTI 3 weeks ago but has had trouble taking his meds (Cipro) because of size of pills.  In ED, he was found to have UTI with severe sepsis and remained somewhat hypotensive after 3L IVF.  Request was made for transfer to [facility].       STUDIES:  CXR 10/19 >>> negative.  SIGNIFICANT EVENTS: 10/19 - presented to [facility] ED, transferred to [facility] with severe sepsis.   HISTORY OF PRESENT ILLNESS: Jordan Ward is a 73 y.o. M with multiple medical co-morbidities as outlined below.  He presented to the [facility] ED on 10/19 with slurred speech and abdominal pain.  He apparently had a mechanical fall the day prior but refused transport to ED at the time. On day of ED presentation, he had been complaining of abdominal pain in the area of his supra-pubic catheter.  His wife informed the EDP that roughly 3 weeks ago, pt was diagnosed with UTI but has been unable to take the medications due to the pills being too big (Cipro).  In addition, he has had small amount of dark colored urine for the past few days.  He has also had some chills, fever up to 101, and generalized weakness.  He usually helps wash dishes and takes a walk in the morning, but this morning was unable to do so due to the weakness. In ED, pt became hypotensive (78/54).  He was also noted to have lactic acid of 7.4.  BP and lactic acid improved after 3L IVF and neo-synephrine gtt; however, hospitalist at [facility] requested that pt be transferred to [facility].  Additional significant labs include  troponin of 1.28.  At this time, he denies any chest pain, SOB, N/V/D, abdominal pain.  He does have some suprapubic discomfort at site of catheter.  In the few hours post ICU arriva: having diarrhea x 2 and stool smells like "C dif"   PAST MEDICAL HISTORY :  Past Medical History  Diagnosis Date  . Arthritis   . MS (multiple sclerosis)   . Insomnia   . PVD (peripheral vascular disease)   . Carotid artery stenosis   . TIA (transient ischemic attack)   . Chronic indwelling Foley catheter 10/06/2011  . Pulmonary fibrosis 10/06/2011  . Dysphagia 10/07/2011  . Tremors of nervous system 10/08/2011  . Hypothyroidism 10/08/2011  . Pulmonary nodule 10/08/2011  . C. difficile colitis 09/2011  . Pneumonia   . Encephalopathy   . Urinary tract infection   . Tremors of nervous system   . Hypokalemia   . Junctional rhythm   . Anemia   . Hypernatremia   . Sacral ulcer   . HTN (hypertension), malignant 10/06/2011  . Sleep apnea   . Neuromuscular disorder   . Back pain, chronic   . Kidney stones   . High grade dysplasia in colonic adenoma 09/2005  . Gait disorder   . OSA (obstructive sleep apnea)   . Paroxysmal atrial tachycardia   . Dyslipidemia   .  Cerebrovascular disease   . CAD (coronary artery disease)   . Peripheral vascular disease   . Bilateral carotid bruits   . Colon polyps   . HA (headache)    Past Surgical History  Procedure Laterality Date  . Inguinal hernia repair  1971    bilateral  . Back surgery  1976/1979  . Colonoscopy  11/2004    Dr. Sharol Roussel sessile polyp splenic flexure, 21mm sessile polyp desc colon, tubulovillous adenoma (bx not removed)  . Colonoscopy  01/2005    poor prep, polyp could not be found  . Colonoscopy  05/2005    with EMR, polypectomy Dr. Olegario Messier, bx showed high grade dysplasia, partially resected  . Colonoscopy  09/2005    Dr. Arsenio Loader, [place] ink tattooing, four villous colon polyp (3 had been missed on previous colonoscopies due to limitations  of procedures  . Colon surgery  09/2005    Fleishman: four tubular adenomas, large adenomatous polyp with HIGH GRADE dysplasia  . Appendectomy  09/2005    at time of left hemicolectomy  . Colonoscopy  09/2006    normal TI, no polyps  . Colonoscopy  10/2007    Dr. Imogene Burn distal mammillations, benign bx, normal TI, random bx neg for microscopic colitis  . Cholecystectomy      Dr. Tamala Julian  . Suprapubic catheter insertion     Prior to Admission medications   Medication Sig Start Date End Date Taking? Authorizing Provider  baclofen (LIORESAL) 10 MG tablet Take 10 mg by mouth 3 (three) times daily as needed for muscle spasms.   Yes Historical Provider, MD  diazepam (VALIUM) 5 MG tablet Take 1 tablet (5 mg total) by mouth 2 (two) times daily. 06/08/14  Yes Kathrynn Ducking, MD  HYDROcodone-acetaminophen ([place]) 7.5-325 MG per tablet Take 1 tablet by mouth 2 (two) times daily.   Yes Historical Provider, MD  levothyroxine (SYNTHROID, LEVOTHROID) 50 MCG tablet Take 50 mcg by mouth daily.   Yes Rexene Alberts, MD  potassium chloride SA (K-DUR,KLOR-CON) 20 MEQ tablet Take 20 mEq by mouth daily.   Yes Rexene Alberts, MD  verapamil (CALAN) 40 MG tablet Take 40 mg by mouth Twice daily. 03/03/12  Yes Historical Provider, MD   Allergies  Allergen Reactions  . Tetracyclines & Related Anaphylaxis and Rash  . Ciprofloxacin     Trouble swallowing    FAMILY HISTORY:  Family History  Problem Relation Age of Onset  . Colon cancer Neg Hx   . Cirrhosis Brother     etoh  . Stroke Mother 19  . Coronary artery disease Father 41  . Heart attack Brother   . Multiple sclerosis     SOCIAL HISTORY:  reports that he has quit smoking. His smoking use included Cigarettes. He smoked 0.00 packs per day. He has never used smokeless tobacco. He reports that he does not drink alcohol or use illicit drugs.  REVIEW OF SYSTEMS:   All negative; except for those that are bolded, which indicate  positives.  Constitutional: weight loss, weight gain, night sweats, fevers, chills, fatigue, weakness.  HEENT: headaches, sore throat, sneezing, nasal congestion, post nasal drip, difficulty swallowing, tooth/dental problems, visual complaints, visual changes, ear aches. Neuro: difficulty with speech, weakness, numbness, ataxia. CV:  chest pain, orthopnea, PND, swelling in lower extremities, dizziness, palpitations, syncope.  Resp: cough, hemoptysis, dyspnea, wheezing. GI  heartburn, indigestion, abdominal pain, nausea, vomiting, diarrhea, constipation, change in bowel habits, loss of appetite, hematemesis, melena, hematochezia.  GU: dysuria, change in color  of urine, urgency or frequency, flank pain, hematuria, decreased urinary output. MSK: joint pain or swelling, decreased range of motion. Psych: change in mood or affect, depression, anxiety, suicidal ideations, homicidal ideations. Skin: rash, itching, bruising.   SUBJECTIVE:   Feeling much better than earlier.    VITAL SIGNS: Temp:  [97.8 F (36.6 C)-99.7 F (37.6 C)] 99.7 F (37.6 C) (10/19 1503) Pulse Rate:  [29-97] 29 (10/19 1700) Resp:  [17-28] 25 (10/19 1730) BP: (75-158)/(39-118) 97/65 mmHg (10/19 1730) SpO2:  [93 %-100 %] 95 % (10/19 1700) Weight:  [54.432 kg (120 lb)] 54.432 kg (120 lb) (10/19 1026) HEMODYNAMICS:   VENTILATOR SETTINGS:   INTAKE / OUTPUT: Intake/Output   None     PHYSICAL EXAMINATION: General: WDWN male, in NAD. Room has "C diff smell" Neuro: A&O x 3, non-focal. SLURRED SPEECH + HEENT: [place]/AT. PERRL, sclerae anicteric. Cardiovascular: RRR, no M/R/G.  Lungs: Respirations even and unlabored.  CTA bilaterally, No W/R/R.  Abdomen: BS x 4, soft. Suprapubic catheter in place, tenderness at insertion site. AND OTHER NON-SPECIFIC TENDERNESS + Musculoskeletal: No gross deformities, no edema.  Skin: Intact, warm, no rashes.  LABS: - updated 21:30 by Staff MD  PULMONARY No results found for this  basename: PHART, PCO2, PCO2ART, PO2, PO2ART, HCO3, TCO2, O2SAT,  in the last 168 hours  CBC  Recent Labs Lab 07/17/14 1121 07/17/14 2032  HGB 12.7* 11.0*  HCT 38.2* 34.1*  WBC 23.0* 14.5*  PLT 164 95*    COAGULATION  Recent Labs Lab 07/17/14 1533  INR 1.61*    CARDIAC   Recent Labs Lab 07/17/14 1533 07/17/14 2032  TROPONINI 1.28* 1.77*   No results found for this basename: PROBNP,  in the last 168 hours   CHEMISTRY  Recent Labs Lab 07/17/14 1121 07/17/14 2032  NA 146  --   K 3.6*  --   CL 104  --   CO2 17*  --   GLUCOSE 106*  --   BUN 49*  --   CREATININE 3.91* 3.96*  CALCIUM 9.7  --    Estimated Creatinine Clearance: 13.2 ml/min (by C-G formula based on Cr of 3.96).   LIVER  Recent Labs Lab 07/17/14 1121 07/17/14 1533  AST 239*  --   ALT 58*  --   ALKPHOS 113  --   BILITOT 1.2  --   PROT 7.7  --   ALBUMIN 3.0*  --   INR  --  1.61*     INFECTIOUS  Recent Labs Lab 07/17/14 1129 07/17/14 1542 07/17/14 2032  LATICACIDVEN 7.4* 4.5* 4.4*  PROCALCITON  --   --  59.17     ENDOCRINE CBG (last 3)   Recent Labs  07/17/14 1957  GLUCAP 54*         IMAGING x48h Dg Chest Port 1 View  07/17/2014   CLINICAL DATA:  Recurrent urinary tract infections.  Fever.  EXAM: PORTABLE CHEST - 1 VIEW  COMPARISON:  November 10, 2011.  FINDINGS: The heart size and mediastinal contours are within normal limits. Both lungs are clear. No pneumothorax or pleural effusion is noted. The visualized skeletal structures are unremarkable.  IMPRESSION: No acute cardiopulmonary abnormality seen.   Electronically Signed   By: Sabino Dick M.D.   On: 07/17/2014 13:48        ASSESSMENT / PLAN:  PULMONARY A: Hx OSA P:   Monitor closely for developing respiratory distress and / or loss of ability to protect airway. CXR in AM to r/o  developing infiltrate.  CARDIOVASCULAR A:  Hx HTN - now hypotensive. Hx HLD Hx CAD  Admitted 07/17/2014 with   -  Occult septic shock, severe sepsis PCT 59 and poor lactate clearance. DDx - C Diff v UTI  - Troponin leak - ? Demand ischemia from hypotension.  P:  Aggressive hydration; recheck lactate at 0.30 07/18/14 and if still not clearing - consider CVL placement Goal MAP > 65; use pressors if needed Trend troponin / lactate. Start heparin gtt , get ECHO ,repeat EKG and depending on course  consult cards. Hold outpatient Verapamil. Daily aspirin  RENAL A:   Baseline creat of 1 with chronic suprapubic cath ? indication Current admission with Acute Renal FAilure - AG metabolic acidosis (25) - lactate + renal failure.   - DDX sepsis, hypotensions . P:   2L fluid bolus D4 1/2 NS @ 75. CMP in AM.  GASTROINTESTINAL A:    ? Shock liver ? C Diff  P:   LFT's in AM. NPO except meds AXR rule out ileus High severity C Diff protocol (empiric)   HEMATOLOGIC A:   Chronic anemia Emerging thrombocytopenia - ? Due to sepsis  P:  Transfuse per usual ICU guidelines - for PRBC < 8g% in NSTEMI and < 7gm% otherwise IV heparin for NSTEMI- monitor platelets CBC in AM. DIC panel check  INFECTIOUS A:   Severe Sepsis , Occult septic shock, Poor lactic clearance   - presumed from urosepsis  V C doff P:   BCx2 10/19 UCx 10/19 Abx: Ceftriaxone, start date 10/19, day 1/x. Abx: IV Flagyl and PO vanc from 07/17/14 for C diff PCT algorithm to limit abx exposure.  ENDOCRINE A:   Hypothyroidism   Running hypoglycemic P:   Continue outpatient synthroid. Phase 1 hyperglycemia protocol   NEUROLOGIC A:   Chronic back pain BAseline MS but functional RLE weakness - per RN but functional at home   - AT admit recent hx of fall without workup. Currently with slurred speech without encephalopathy  P:   Hold outpatient baclofen, valium, norco for now. Neuro consult - Dr Nicole Kindred called  DERM A:  Small Stage 2 sacral decub per RN P Wound care consult  FAMILY Updates: none done  IDT : due by  07/24/14    Montey Hora, [facility] Pgr: [phone]  or [phone] 07/17/2014, 5:49 PM   STAFF NOTE: I, Dr Ann Lions have personally reviewed patient's available data, including medical history, events of note, physical examination and test results as part of my evaluation. I have discussed with resident/NP and other care providers such as pharmacist, RN and RRT.  In addition,  I personally evaluated patient and elicited key findings of seevere sepsis, occult septic shock based on high PCT. Ddx is UTI v C Diff HE has very poor lactate clerance and high WBC and dropping platelets all suggesting of worsening sepsis. COncern is he has bad ciff due to diarrhea and smell in room but could be UTIO too. Will Rx aggressively for C diff. Will check AXR - depending on further lactate, AXR and course might need CCS consult. IN addition he has renal failure - so hydrate again. And posible NSTEMI - start IV heparin and get echo. No family at bedside   Rest per NP/medical resident whose note is outlined above and that I agree with  The patient is critically ill with multiple organ systems failure and requires high  complexity decision making for assessment and support, frequent evaluation and titration of therapies, application of advanced monitoring technologies and extensive interpretation of multiple databases.   Critical Care Time devoted to patient care services described in this note is  45  Minutes. This time reflects time of care of this signee Dr Brand Males. This critical care time does not reflect procedure time, or teaching time or supervisory time of PA/NP/Med student/Med Resident etc but could involve care discussion time    Dr. Brand Males, M.D., [facility].C.P Pulmonary and Critical Care Medicine Staff Physician [place] [facility] Pager: [phone], If no answer or between  15:00h - 7:00h: call [phone]  07/17/2014 10:02 PM

## 2014-07-18 ENCOUNTER — Inpatient Hospital Stay (HOSPITAL_COMMUNITY): Payer: Medicare Other

## 2014-07-18 ENCOUNTER — Encounter (HOSPITAL_COMMUNITY): Payer: Self-pay | Admitting: *Deleted

## 2014-07-18 DIAGNOSIS — R6520 Severe sepsis without septic shock: Secondary | ICD-10-CM

## 2014-07-18 DIAGNOSIS — A419 Sepsis, unspecified organism: Secondary | ICD-10-CM

## 2014-07-18 DIAGNOSIS — I059 Rheumatic mitral valve disease, unspecified: Secondary | ICD-10-CM

## 2014-07-18 LAB — CBC WITH DIFFERENTIAL/PLATELET
Basophils Absolute: 0 10*3/uL (ref 0.0–0.1)
Basophils Relative: 0 % (ref 0–1)
EOS ABS: 0 10*3/uL (ref 0.0–0.7)
EOS PCT: 0 % (ref 0–5)
HCT: 28.7 % — ABNORMAL LOW (ref 39.0–52.0)
Hemoglobin: 9.3 g/dL — ABNORMAL LOW (ref 13.0–17.0)
Lymphocytes Relative: 4 % — ABNORMAL LOW (ref 12–46)
Lymphs Abs: 0.8 10*3/uL (ref 0.7–4.0)
MCH: 30 pg (ref 26.0–34.0)
MCHC: 32.4 g/dL (ref 30.0–36.0)
MCV: 92.6 fL (ref 78.0–100.0)
Monocytes Absolute: 0.4 10*3/uL (ref 0.1–1.0)
Monocytes Relative: 2 % — ABNORMAL LOW (ref 3–12)
NEUTROS PCT: 94 % — AB (ref 43–77)
Neutro Abs: 18.5 10*3/uL — ABNORMAL HIGH (ref 1.7–7.7)
PLATELETS: 82 10*3/uL — AB (ref 150–400)
RBC: 3.1 MIL/uL — AB (ref 4.22–5.81)
RDW: 15.5 % (ref 11.5–15.5)
SMEAR REVIEW: DECREASED
WBC: 19.7 10*3/uL — ABNORMAL HIGH (ref 4.0–10.5)

## 2014-07-18 LAB — COMPREHENSIVE METABOLIC PANEL
ALBUMIN: 2.1 g/dL — AB (ref 3.5–5.2)
ALK PHOS: 78 U/L (ref 39–117)
ALT: 60 U/L — AB (ref 0–53)
ALT: 55 U/L — ABNORMAL HIGH (ref 0–53)
AST: 197 U/L — AB (ref 0–37)
AST: 151 U/L — ABNORMAL HIGH (ref 0–37)
Albumin: 1.9 g/dL — ABNORMAL LOW (ref 3.5–5.2)
Alkaline Phosphatase: 73 U/L (ref 39–117)
Anion gap: 19 — ABNORMAL HIGH (ref 5–15)
Anion gap: 17 — ABNORMAL HIGH (ref 5–15)
BILIRUBIN TOTAL: 0.4 mg/dL (ref 0.3–1.2)
BUN: 50 mg/dL — AB (ref 6–23)
BUN: 55 mg/dL — ABNORMAL HIGH (ref 6–23)
CHLORIDE: 114 meq/L — AB (ref 96–112)
CO2: 16 mEq/L — ABNORMAL LOW (ref 19–32)
CO2: 15 meq/L — AB (ref 19–32)
CREATININE: 3.88 mg/dL — AB (ref 0.50–1.35)
Calcium: 7.4 mg/dL — ABNORMAL LOW (ref 8.4–10.5)
Calcium: 7 mg/dL — ABNORMAL LOW (ref 8.4–10.5)
Chloride: 114 mEq/L — ABNORMAL HIGH (ref 96–112)
Creatinine, Ser: 3.91 mg/dL — ABNORMAL HIGH (ref 0.50–1.35)
GFR calc Af Amer: 16 mL/min — ABNORMAL LOW (ref >90)
GFR calc non Af Amer: 14 mL/min — ABNORMAL LOW (ref >90)
GFR, EST AFRICAN AMERICAN: 17 mL/min — AB (ref >90)
GFR, EST NON AFRICAN AMERICAN: 14 mL/min — AB (ref >90)
GLUCOSE: 145 mg/dL — AB (ref 70–99)
Glucose, Bld: 119 mg/dL — ABNORMAL HIGH (ref 70–99)
POTASSIUM: 3.6 meq/L — AB (ref 3.7–5.3)
Potassium: 3.7 mEq/L (ref 3.7–5.3)
SODIUM: 149 meq/L — AB (ref 137–147)
Sodium: 146 mEq/L (ref 137–147)
TOTAL PROTEIN: 5.7 g/dL — AB (ref 6.0–8.3)
TOTAL PROTEIN: 5.3 g/dL — AB (ref 6.0–8.3)
Total Bilirubin: 0.3 mg/dL (ref 0.3–1.2)

## 2014-07-18 LAB — GLUCOSE, CAPILLARY
GLUCOSE-CAPILLARY: 52 mg/dL — AB (ref 70–99)
GLUCOSE-CAPILLARY: 91 mg/dL (ref 70–99)
Glucose-Capillary: 115 mg/dL — ABNORMAL HIGH (ref 70–99)
Glucose-Capillary: 116 mg/dL — ABNORMAL HIGH (ref 70–99)
Glucose-Capillary: 120 mg/dL — ABNORMAL HIGH (ref 70–99)
Glucose-Capillary: 192 mg/dL — ABNORMAL HIGH (ref 70–99)
Glucose-Capillary: 53 mg/dL — ABNORMAL LOW (ref 70–99)

## 2014-07-18 LAB — PROCALCITONIN: PROCALCITONIN: 59.15 ng/mL

## 2014-07-18 LAB — PHOSPHORUS: Phosphorus: 3.1 mg/dL (ref 2.3–4.6)

## 2014-07-18 LAB — CORTISOL: Cortisol, Plasma: 43.3 ug/dL

## 2014-07-18 LAB — BLOOD GAS, ARTERIAL
ACID-BASE DEFICIT: 9.8 mmol/L — AB (ref 0.0–2.0)
BICARBONATE: 14.8 meq/L — AB (ref 20.0–24.0)
Drawn by: 41977
O2 Content: 2 L/min
O2 Saturation: 95.8 %
PH ART: 7.345 — AB (ref 7.350–7.450)
PO2 ART: 87.5 mmHg (ref 80.0–100.0)
Patient temperature: 98.6
TCO2: 15.6 mmol/L (ref 0–100)
pCO2 arterial: 27.8 mmHg — ABNORMAL LOW (ref 35.0–45.0)

## 2014-07-18 LAB — PROTIME-INR
INR: 1.68 — ABNORMAL HIGH (ref 0.00–1.49)
Prothrombin Time: 19.9 seconds — ABNORMAL HIGH (ref 11.6–15.2)

## 2014-07-18 LAB — DIC (DISSEMINATED INTRAVASCULAR COAGULATION)PANEL
D-Dimer, Quant: 14.53 ug/mL-FEU — ABNORMAL HIGH (ref 0.00–0.48)
INR: 1.96 — ABNORMAL HIGH (ref 0.00–1.49)
Prothrombin Time: 22.5 seconds — ABNORMAL HIGH (ref 11.6–15.2)

## 2014-07-18 LAB — DIC (DISSEMINATED INTRAVASCULAR COAGULATION) PANEL
APTT: 116 s — AB (ref 24–37)
FIBRINOGEN: 456 mg/dL (ref 204–475)
PLATELETS: 96 10*3/uL — AB (ref 150–400)
SMEAR REVIEW: NONE SEEN

## 2014-07-18 LAB — CLOSTRIDIUM DIFFICILE BY PCR: Toxigenic C. Difficile by PCR: NEGATIVE

## 2014-07-18 LAB — LIPASE, BLOOD: LIPASE: 5 U/L — AB (ref 11–59)

## 2014-07-18 LAB — MAGNESIUM: Magnesium: 1.5 mg/dL (ref 1.5–2.5)

## 2014-07-18 LAB — LACTIC ACID, PLASMA
Lactic Acid, Venous: 6.6 mmol/L — ABNORMAL HIGH (ref 0.5–2.2)
Lactic Acid, Venous: 3.2 mmol/L — ABNORMAL HIGH (ref 0.5–2.2)

## 2014-07-18 LAB — CK: Total CK: 3719 U/L — ABNORMAL HIGH (ref 7–232)

## 2014-07-18 LAB — TROPONIN I
Troponin I: 3.12 ng/mL (ref <0.30)
Troponin I: 4.93 ng/mL (ref <0.30)

## 2014-07-18 LAB — PRO B NATRIURETIC PEPTIDE: Pro B Natriuretic peptide (BNP): >70000 pg/mL — ABNORMAL HIGH (ref 0–125)

## 2014-07-18 MED ORDER — ASPIRIN 81 MG PO CHEW
81.0000 mg | CHEWABLE_TABLET | Freq: Every day | ORAL | Status: DC
  Administered 2014-07-18 – 2014-07-26 (×9): 81 mg via ORAL
  Filled 2014-07-18 (×10): qty 1

## 2014-07-18 MED ORDER — DEXTROSE 50 % IV SOLN
50.0000 mL | Freq: Once | INTRAVENOUS | Status: AC | PRN
  Administered 2014-07-18: 50 mL via INTRAVENOUS

## 2014-07-18 MED ORDER — COLLAGENASE 250 UNIT/GM EX OINT
TOPICAL_OINTMENT | Freq: Every day | CUTANEOUS | Status: DC
  Administered 2014-07-18 – 2014-07-26 (×9): via TOPICAL
  Filled 2014-07-18: qty 30

## 2014-07-18 MED ORDER — SACCHAROMYCES BOULARDII 250 MG PO CAPS
250.0000 mg | ORAL_CAPSULE | Freq: Two times a day (BID) | ORAL | Status: DC
  Administered 2014-07-18 – 2014-07-26 (×17): 250 mg via ORAL
  Filled 2014-07-18 (×18): qty 1

## 2014-07-18 MED ORDER — DEXTROSE 10 % IV SOLN
INTRAVENOUS | Status: DC
  Administered 2014-07-18: 1000 mL via INTRAVENOUS

## 2014-07-18 MED ORDER — DEXTROSE 50 % IV SOLN
INTRAVENOUS | Status: AC
  Filled 2014-07-18: qty 50

## 2014-07-18 MED ORDER — IPRATROPIUM-ALBUTEROL 0.5-2.5 (3) MG/3ML IN SOLN
3.0000 mL | RESPIRATORY_TRACT | Status: DC | PRN
  Administered 2014-07-18: 3 mL via RESPIRATORY_TRACT
  Filled 2014-07-18: qty 3

## 2014-07-18 MED ORDER — CHLORHEXIDINE GLUCONATE 0.12 % MT SOLN
15.0000 mL | Freq: Two times a day (BID) | OROMUCOSAL | Status: DC
  Administered 2014-07-18: 15 mL via OROMUCOSAL
  Filled 2014-07-18: qty 15

## 2014-07-18 MED ORDER — CETYLPYRIDINIUM CHLORIDE 0.05 % MT LIQD
7.0000 mL | Freq: Two times a day (BID) | OROMUCOSAL | Status: DC
  Administered 2014-07-18 (×2): 7 mL via OROMUCOSAL

## 2014-07-18 MED ORDER — DEXTROSE 5 % IV SOLN
1.0000 g | INTRAVENOUS | Status: DC
  Administered 2014-07-18 – 2014-07-20 (×3): 1 g via INTRAVENOUS
  Filled 2014-07-18 (×3): qty 1

## 2014-07-18 MED ORDER — DEXTROSE 50 % IV SOLN
25.0000 mL | Freq: Once | INTRAVENOUS | Status: AC | PRN
  Administered 2014-07-18: 50 mL via INTRAVENOUS
  Filled 2014-07-18: qty 50

## 2014-07-18 NOTE — Progress Notes (Signed)
CRITICAL VALUE ALERT  Critical value received:  Blood culture anarobic bottle gram negative rods  Date of notification:  07/18/14  Time of notification:  [id number]  Critical value read back:yes  Nurse who received alert:  A.Ezequiel Ganser RN  MD notified (1st page):  Deterding MD  Time of first page:  0448  MD notified (2nd page):  Time of second page:  Responding MD:  Deterding MD  Time MD responded:  [phone]

## 2014-07-18 NOTE — Consult Note (Signed)
WOC wound consult note Reason for Consult: Consult requested for several wounds.   Wound type: Left buttock with stage 1 wound; 1X1cm.  Sacrum with stage 1 wound; 5X5cm.  No open wound or drainage. Right buttock with stage 3 wound; 20% red, 80% yellow, small amt yellow drainage, no odor Right ankle with stage 3 wound; 1X1X.2cm, 50% red, 50% yellow, dry with minimal amt yellow drainage, no odor Pressure Ulcer POA: Yes Dressing procedure/placement/frequency: Pt on Sport low-airloss bed to reduce pressure.  Santyl ointment to chemically debride nonviable tissue to right buttock and right ankle.  Foam dressing to left buttock and barrier cream to sacrum to protect and promote healing. Please re-consult if further assistance is needed.  Thank-you,  Julien Girt MSN, [place], [place], [place], [place]

## 2014-07-18 NOTE — Progress Notes (Signed)
Hypoglycemic Event  CBG: 53  Treatment: D50  Symptoms: none  Follow-up CBG: Time:0100 CBG Result:120  Possible Reasons for Event: npo  Comments/MD notified:Deterding MD Dextrose 10 started at 30.    Dell Ponto Rosanne  Remember to initiate Hypoglycemia Order Set & complete

## 2014-07-18 NOTE — Progress Notes (Signed)
Echocardiogram 2D Echocardiogram has been performed.  Jordan Ward 07/18/2014, 1:43 PM

## 2014-07-18 NOTE — Progress Notes (Signed)
Hypoglycemic Event  CBG: 57 0820  Treatment: D50 IV 50 mL  Symptoms: Pale  Follow-up CBG: [id number] CBG Result: 192  Possible Reasons for Event: Unknown  Comments/MD notified:notified Marni Griffon NP    Birder Robson lashell  Remember to initiate Hypoglycemia Order Set & complete

## 2014-07-18 NOTE — Care Management Note (Addendum)
    Page 1 of 1   07/24/2014     6:29:59 PM CARE MANAGEMENT NOTE 07/24/2014  Patient:  Jordan Ward, Jordan Ward   Account Number:  [id number]  Date Initiated:  07/18/2014  Documentation initiated by:  Elissa Hefty  Subjective/Objective Assessment:   adm w sepsis, mi     Action/Plan:   lives w wife, pcp dr b Collene Mares   Anticipated DC Date:  07/26/2014   Anticipated DC Plan:  Carlos referral  Clinical Social Worker      DC Planning Services  CM consult      Choice offered to / List presented to:             Status of service:  In process, will continue to follow Medicare Important Message given?  YES (If response is "NO", the following Medicare IM given date fields will be blank) Date Medicare IM given:  07/20/2014 Medicare IM given by:  Elissa Hefty Date Additional Medicare IM given:  07/24/2014 Additional Medicare IM given by:  [facility] Maeryn Mcgath  Discharge Disposition:    Per UR Regulation:  Reviewed for med. necessity/level of care/duration of stay  If discussed at [place] of Stay Meetings, dates discussed:    Comments:  07/24/14 [place], BSN [phone] patient conts on iv abx, poor po intake secondary to hx of MS, Patient for a stress test on 10/27. plan is for snf , CSW following.

## 2014-07-18 NOTE — Progress Notes (Signed)
Patients suprapubic catheter is draining around catheter. Not able to measure strict I&Os. MD Ramaswamy made aware.   Jordan Ward

## 2014-07-18 NOTE — Progress Notes (Signed)
PULMONARY / CRITICAL CARE MEDICINE   Name: Jordan Ward MRN: [id number] DOB: 1941/02/07    ADMISSION DATE:  07/17/2014 CONSULTATION DATE:  07/18/2014  REFERRING MD :  EDP  CHIEF COMPLAINT:  Septic Shock  INITIAL PRESENTATION: 73 y.o. M brought to [facility] ED on 10/19 with slurred speech.  Had a fall the day prior but refused transport to ED.  On 10/19, had some slurring of speech and was complaining of abdominal pain in area of supra-pubic catheter.  He was apparently diagnosed with UTI 3 weeks ago but has had trouble taking his meds (Cipro) because of size of pills.  In ED, he was found to have UTI with severe sepsis and remained somewhat hypotensive after 3L IVF.  Request was made for transfer to [facility] ICU.    STUDIES:  CXR 10/19 >>> negative. AXR 10/10>>> Non obstructed bowel gas pattern. Chronic nephrolithiasis and vascular calcified atherosclerosis. Ab/chest CT 10/20>>> Moderate obstruction of the left kidney and ureter. Bladder stones are present, probably representing recently passed stones. No definite distal ureteral stones otherwise identified. Bilateral intrarenal stones. Stones in the left renal pelvis. Foley catheter in the bladder. Small amount of free fluid in the pelvis. C Diff- 10/19>>> negative   SIGNIFICANT EVENTS: 10/19 - presented to [facility] ED, transferred to [facility] with severe sepsis. 10/20- normotensive, hypoglycemic   HISTORY OF PRESENT ILLNESS: Jordan Ward is a 73 y.o. M with multiple medical co-morbidities (MS, peripheral vascular disease, chronic indwelling suprapubic catheter, pulmonary fibrosis, C. difficile colitis, urinary tract infection and hypertension)  He presented to the [facility] ED on 10/19 with slurred speech and abdominal pain.  He apparently had a mechanical fall the day prior but refused transport to ED at the time. On day of ED presentation, he had been complaining of abdominal pain in the area of his supra-pubic catheter.  His wife informed the EDP that roughly 3 weeks  ago, pt was diagnosed with UTI but has been unable to take the medications due to the pills being too big (Cipro).  In addition, he has had small amount of dark colored urine for the past few days.  He has also had some chills, fever up to 101, and generalized weakness.  He usually helps wash dishes and takes a walk in the morning, but this morning was unable to do so due to the weakness. In ED, pt became hypotensive (78/54).  He was also noted to have lactic acid of 7.4.  BP and lactic acid improved after 3L IVF and neo-synephrine gtt; however, hospitalist at [facility] requested that pt be transferred to [facility].  Additional significant labs include troponin of 1.28.   PAST MEDICAL HISTORY :  Past Medical History  Diagnosis Date  . Arthritis   . MS (multiple sclerosis)   . Insomnia   . PVD (peripheral vascular disease)   . Carotid artery stenosis   . TIA (transient ischemic attack)   . Chronic indwelling Foley catheter 10/06/2011  . Pulmonary fibrosis 10/06/2011  . Dysphagia 10/07/2011  . Tremors of nervous system 10/08/2011  . Hypothyroidism 10/08/2011  . Pulmonary nodule 10/08/2011  . C. difficile colitis 09/2011  . Pneumonia   . Encephalopathy   . Urinary tract infection   . Tremors of nervous system   . Hypokalemia   . Junctional rhythm   . Anemia   . Hypernatremia   . Sacral ulcer   . HTN (hypertension), malignant 10/06/2011  . Sleep apnea   . Neuromuscular disorder   . Back  pain, chronic   . Kidney stones   . High grade dysplasia in colonic adenoma 09/2005  . Gait disorder   . OSA (obstructive sleep apnea)   . Paroxysmal atrial tachycardia   . Dyslipidemia   . Cerebrovascular disease   . CAD (coronary artery disease)   . Peripheral vascular disease   . Bilateral carotid bruits   . Colon polyps   . HA (headache)    Past Surgical History  Procedure Laterality Date  . Inguinal hernia repair  1971    bilateral  . Back surgery  1976/1979  . Colonoscopy  11/2004    Dr. Sharol Roussel sessile  polyp splenic flexure, 85mm sessile polyp desc colon, tubulovillous adenoma (bx not removed)  . Colonoscopy  01/2005    poor prep, polyp could not be found  . Colonoscopy  05/2005    with EMR, polypectomy Dr. Olegario Messier, bx showed high grade dysplasia, partially resected  . Colonoscopy  09/2005    Dr. Arsenio Loader, [place] ink tattooing, four villous colon polyp (3 had been missed on previous colonoscopies due to limitations of procedures  . Colon surgery  09/2005    Fleishman: four tubular adenomas, large adenomatous polyp with HIGH GRADE dysplasia  . Appendectomy  09/2005    at time of left hemicolectomy  . Colonoscopy  09/2006    normal TI, no polyps  . Colonoscopy  10/2007    Dr. Imogene Burn distal mammillations, benign bx, normal TI, random bx neg for microscopic colitis  . Cholecystectomy      Dr. Tamala Julian  . Suprapubic catheter insertion     Prior to Admission medications   Medication Sig Start Date End Date Taking? Authorizing Provider  baclofen (LIORESAL) 10 MG tablet Take 10 mg by mouth 3 (three) times daily as needed for muscle spasms.   Yes Historical Provider, MD  diazepam (VALIUM) 5 MG tablet Take 1 tablet (5 mg total) by mouth 2 (two) times daily. 06/08/14  Yes Kathrynn Ducking, MD  HYDROcodone-acetaminophen ([place]) 7.5-325 MG per tablet Take 1 tablet by mouth 2 (two) times daily.   Yes Historical Provider, MD  levothyroxine (SYNTHROID, LEVOTHROID) 50 MCG tablet Take 50 mcg by mouth daily.   Yes Rexene Alberts, MD  potassium chloride SA (K-DUR,KLOR-CON) 20 MEQ tablet Take 20 mEq by mouth daily.   Yes Rexene Alberts, MD  verapamil (CALAN) 40 MG tablet Take 40 mg by mouth Twice daily. 03/03/12  Yes Historical Provider, MD   Allergies  Allergen Reactions  . Tetracyclines & Related Anaphylaxis and Rash  . Ciprofloxacin     Trouble swallowing    FAMILY HISTORY:  Family History  Problem Relation Age of Onset  . Colon cancer Neg Hx   . Cirrhosis Brother     etoh  . Stroke Mother 90   . Coronary artery disease Father 80  . Heart attack Brother   . Multiple sclerosis     SOCIAL HISTORY:  reports that he has quit smoking. His smoking use included Cigarettes. He smoked 0.00 packs per day. He has never used smokeless tobacco. He reports that he does not drink alcohol or use illicit drugs.  SUBJECTIVE:   Feeling much better than earlier.    VITAL SIGNS: Temp:  [97.8 F (36.6 C)-101.3 F (38.5 C)] 99.9 F (37.7 C) (10/20 0828) Pulse Rate:  [29-105] 95 (10/20 0800) Resp:  [16-41] 16 (10/20 0800) BP: (75-158)/(39-118) 131/70 mmHg (10/20 0800) SpO2:  [93 %-100 %] 100 % (10/20 0800) Weight:  [54.432 kg (  120 lb)-62 kg (136 lb 11 oz)] 62 kg (136 lb 11 oz) (10/20 0500) HEMODYNAMICS:   VENTILATOR SETTINGS:   INTAKE / OUTPUT: Intake/Output     10/19 0701 - 10/20 0700 10/20 0701 - 10/21 0700   I.V. (mL/kg) 855.5 (13.8) 105 (1.7)   IV Piggyback 2200    Total Intake(mL/kg) 3055.5 (49.3) 105 (1.7)   Urine (mL/kg/hr) 325 150 (1.5)   Total Output 325 150   Net +2730.5 -45        Stool Occurrence 5 x      PHYSICAL EXAMINATION: General: WDWN male, in NAD.  Neuro: A&O x 3, non-focal. SLURRED SPEECH + HEENT: /AT. PERRL, sclerae anicteric. Cardiovascular: RRR, no M/R/G.  Lungs: Respirations even and unlabored.  CTA bilaterally, No W/R/R.  Abdomen: BS x 4, soft. Suprapubic catheter in place, tenderness at insertion site.  Musculoskeletal: No gross deformities, no edema.  Skin: Intact, warm, no rashes.  LABS:   PULMONARY  Recent Labs Lab 07/17/14 2149 07/18/14 0308  PHART 7.392 7.345*  PCO2ART 25.1* 27.8*  PO2ART 56.3* 87.5  HCO3 15.0* 14.8*  TCO2 15.7 15.6  O2SAT 88.1 95.8    CBC  Recent Labs Lab 07/17/14 1121 07/17/14 2032 07/18/14 0119  HGB 12.7* 11.0*  --   HCT 38.2* 34.1*  --   WBC 23.0* 14.5*  --   PLT 164 95* 96*    COAGULATION  Recent Labs Lab 07/17/14 1533 07/18/14 0119  INR 1.61* 1.96*    CARDIAC    Recent Labs Lab  07/17/14 1533 07/17/14 2032 07/18/14 0119  TROPONINI 1.28* 1.77* 3.12*    Recent Labs Lab 07/18/14 0119  PROBNP >70000.0*     CHEMISTRY  Recent Labs Lab 07/17/14 1121 07/17/14 2032 07/18/14 0119  NA 146 146 149*  K 3.6* 3.8 3.6*  CL 104 110 114*  CO2 17* 17* 16*  GLUCOSE 106* 80 119*  BUN 49* 52* 50*  CREATININE 3.91* 3.96*  3.95* 3.91*  CALCIUM 9.7 8.1* 7.4*   Estimated Creatinine Clearance: 15.2 ml/min (by C-G formula based on Cr of 3.91).   LIVER  Recent Labs Lab 07/17/14 1121 07/17/14 1533 07/18/14 0119  AST 239*  --  197*  ALT 58*  --  60*  ALKPHOS 113  --  78  BILITOT 1.2  --  0.4  PROT 7.7  --  5.7*  ALBUMIN 3.0*  --  2.1*  INR  --  1.61* 1.96*     INFECTIOUS  Recent Labs Lab 07/17/14 1542 07/17/14 2032 07/18/14 0119  LATICACIDVEN 4.5* 4.4* 6.6*  PROCALCITON  --  59.17 59.15     ENDOCRINE CBG (last 3)   Recent Labs  07/18/14 0101 07/18/14 0344 07/18/14 0814  GLUCAP 120* 115* 52*         IMAGING x48h Ct Abdomen Pelvis Wo Contrast  07/18/2014   CLINICAL DATA:  Increased weakness. Acute sepsis. Increasing lack today.  EXAM: CT ABDOMEN AND PELVIS WITHOUT CONTRAST  TECHNIQUE: Multidetector CT imaging of the abdomen and pelvis was performed following the standard protocol without IV contrast.  COMPARISON:  07/21/2011  FINDINGS: Small bilateral pleural effusions with atelectasis or infiltration in both lung bases. Coronary artery and aortic valve calcifications.  The left kidney is significantly enlarged in comparison to the right. There is left hydronephrosis and hydroureter. Stranding around the left kidney in ureter. No ureteral stones are demonstrated but 2 stones are shown in the bladder. There are multiple intrarenal stones in the left renal pelvis and lower  pole right kidney. Additional calcifications in both kidneys are probably vascular. Low-attenuation lesions arising from the upper pole of the left kidney consistent with  cysts and similar to previous study.  Surgical absence of the gallbladder. Mild bile duct dilatation. Vague increased density at the area of the distal common bile duct may represent an intraductal stone. The unenhanced appearance of the liver, spleen, pancreas, adrenal glands, inferior vena cava, and retroperitoneal lymph nodes is unremarkable. Vascular calcifications in the abdominal aorta without aneurysm. Stomach, small bowel, and colon are not abnormally distended. There is a broad-based anterior abdominal [place] hernia which contains bowel but without evidence of compromise. No free air or free fluid in the abdomen.  Pelvis: Small amount of free fluid in the pelvis is nonspecific but likely reactive. Foley catheter decompresses the bladder. No evidence of diverticulitis. Appendix is probably surgically absent. Degenerative changes in the lumbar spine. No destructive bone lesions appreciated.  IMPRESSION: Moderate obstruction of the left kidney and ureter. Bladder stones are present, probably representing recently passed stones. No definite distal ureteral stones otherwise identified. Bilateral intrarenal stones. Stones in the left renal pelvis. Foley catheter in the bladder. Small amount of free fluid in the pelvis.   Electronically Signed   By: Lucienne Capers M.D.   On: 07/18/2014 04:42   Dg Chest Port 1 View  07/18/2014   CLINICAL DATA:  Sepsis, shortness of breath, personal history of multiple sclerosis, peripheral vascular disease, TIA, pulmonary fibrosis, hypertension, coronary artery disease  EXAM: PORTABLE CHEST - 1 VIEW  COMPARISON:  Portable exam 0441 hr compared to 07/17/2014  FINDINGS: Normal heart size, mediastinal contours and pulmonary vascularity.  Atherosclerotic calcification aorta.  Mild RIGHT basilar atelectasis.  Atelectasis versus infiltrate developing in LEFT lower lobe.  Remaining lungs clear.  No definite pleural effusion or pneumothorax.  IMPRESSION: Mild RIGHT basilar atelectasis  with developing infiltrate versus atelectasis in LEFT lower lobe.   Electronically Signed   By: Lavonia Dana M.D.   On: 07/18/2014 07:48   Dg Chest Port 1 View  07/17/2014   CLINICAL DATA:  Recurrent urinary tract infections.  Fever.  EXAM: PORTABLE CHEST - 1 VIEW  COMPARISON:  November 10, 2011.  FINDINGS: The heart size and mediastinal contours are within normal limits. Both lungs are clear. No pneumothorax or pleural effusion is noted. The visualized skeletal structures are unremarkable.  IMPRESSION: No acute cardiopulmonary abnormality seen.   Electronically Signed   By: Sabino Dick M.D.   On: 07/17/2014 13:48   Dg Abd Portable 1v  07/17/2014   CLINICAL DATA:  73 year old male with acute abdominal pain. Initial encounter.  EXAM: PORTABLE ABDOMEN - 1 VIEW  COMPARISON:  CT Abdomen and Pelvis 07/21/2011.  FINDINGS: Portable [facility] supine view at 2237 hrs. Calcified atherosclerosis including bilateral renal hilar calcifications. Superimposed nephrolithiasis. Stable cholecystectomy clips. Mild respiratory motion. Non obstructed bowel gas pattern. No acute osseous abnormality identified.  IMPRESSION: 1.  Non obstructed bowel gas pattern. 2. Chronic nephrolithiasis and vascular calcified atherosclerosis.   Electronically Signed   By: Lars Pinks M.D.   On: 07/17/2014 22:55    ASSESSMENT / PLAN:  PULMONARY A: Hx OSA Hx Pulmonary fibrosis CTA CXR shows left atelectasis versus infiltrate and right atelectasis  P:   Monitor closely for developing respiratory distress and / or loss of ability to protect airway. Encourage pulmonary hygiene and IS.  Oxygen per protocol for sat >92   CARDIOVASCULAR A:  Septic shock- resolved Hx HTN  Hx HLD Hx  CAD Troponin leak (demand ischemia vs NSTEMI)  P:  CVL for inability to clear lactate and CVP monitoring/ frequent blood draws Goal MAP > 65 Trend troponin / lactate. Heparin gtt for NSTEMI, get ECHO, repeat EKG consult cards for NSTEMI vs demand ischemia?   Hold home Verapamil. Daily aspirin  RENAL A:   Baseline creat of 1 chronic suprapubic cath indication Acute Renal Failure - AG metabolic acidosis (25) - lactate + renal failure. Hypokalemia Hyperchloremia Hypernatremia Chronic Nephrolitiasis hydronephrosis Suprapubic catheter leaking and painful at site  P:   Urology consult Replace potassium D5 1/2 NS @ 75.   GASTROINTESTINAL A:   Elevated transaminases (DDx shock liver vs liver failure) Antibiotic associated Diarrhea- not C Diff  P:   Trend hepatic panel Advance diet Probiotics   HEMATOLOGIC A:   Chronic anemia Thrombocytopenia (due to sepsis?)  P:  Transfuse per usual ICU guidelines - for PRBC < 8g% in NSTEMI and < 7gm% otherwise DC heparin in setting of thrombocytopenia  CBC in AM. HITT panel ordered  INFECTIOUS A:   Severe Sepsis Occult septic shock Gram negative bacteremia Chronic Suprapubic catheter  P:   BCx2 10/19- GNR UCx 10/19- C Diff 10/19- negative  Abx: Tressie Ellis, start date 10/20  Ceftriaxone, start date 10/19; DC'd DC IV Flagyl and PO vanc from 07/17/14 for no c diff PCT algorithm to limit abx exposure.  ENDOCRINE A:   Hypothyroidism   Hypoglycemic  P:   Consider AI; obtain cortisol Continue outpatient synthroid. Phase 1 hyperglycemia protocol   NEUROLOGIC A:   Chronic back pain Baseline MS but functional RLE weakness - functional at home Recent hx of fall without workup.  Slurred speech without encephalopathy  P:   MRI when stable Neurology consulted and feels patients neuro deficits are secondary to sepsis and renal failure rather than acute MS exacerbation. Neuro recommends continuing valium and baclofen at current doses; however, will hold until infectious process is cleared. Hold outpatient baclofen, valium, norco for now.   DERM A:  Left buttock stage 1 PU Sacrum stage 1 PU Righ buttock stage 3 PU Right ankle stage 3 PU  P Santyl and barrier cream to  wound, per consult  FAMILY Updates: none done  IDT : due by 07/24/14   Summary: Occult septic shock. Presumed urosepsis. Now normotensive with no complaints.    Merton Border, MD ; [facility] [phone].  After 5:30 PM or weekends, call [phone]

## 2014-07-18 NOTE — Progress Notes (Signed)
ANTIBIOTIC CONSULT NOTE - INITIAL  Pharmacy Consult for Ceftazidime Indication: GNR bacteremia/urosepsis  Allergies  Allergen Reactions  . Tetracyclines & Related Anaphylaxis and Rash  . Ciprofloxacin     Trouble swallowing    Patient Measurements: Height: 5\' 6"  (167.6 cm) Weight: 136 lb 11 oz (62 kg) IBW/kg (Calculated) : 63.8  Vital Signs: Temp: 98.2 F (36.8 C) (10/20 1149) Temp Source: Oral (10/20 1149) BP: 125/58 mmHg (10/20 1200) Pulse Rate: 91 (10/20 1200) Intake/Output from previous day: 10/19 0701 - 10/20 0700 In: 3055.5 [I.V.:855.5; IV Piggyback:2200] Out: 325 [Urine:325] Intake/Output from this shift: Total I/O In: 575 [I.V.:525; IV Piggyback:50] Out: 325 [Urine:325]  Labs:  Recent Labs  07/17/14 1121 07/17/14 2032 07/18/14 0119 07/18/14 0739  WBC 23.0* 14.5*  --  19.7*  HGB 12.7* 11.0*  --  9.3*  PLT 164 95* 96* 82*  CREATININE 3.91* 3.96*  3.95* 3.91*  --    Estimated Creatinine Clearance: 15.2 ml/min (by C-G formula based on Cr of 3.91). No results found for this basename: VANCOTROUGH, VANCOPEAK, VANCORANDOM, GENTTROUGH, GENTPEAK, GENTRANDOM, TOBRATROUGH, TOBRAPEAK, TOBRARND, AMIKACINPEAK, AMIKACINTROU, AMIKACIN,  in the last 72 hours   Microbiology: Recent Results (from the past 720 hour(s))  CULTURE, BLOOD (ROUTINE X 2)     Status: None   Collection Time    07/17/14 11:29 AM      Result Value Ref Range Status   Specimen Description BLOOD RIGHT HAND   Final   Special Requests BOTTLES DRAWN AEROBIC AND ANAEROBIC 12CC   Final   Culture     Final   Value: GRAM NEGATIVE RODS     Gram Stain Report Called to,Read Back By and Verified With: SPERRY A AT [place] ON [id number] AT [place] BY Mikel Cella K     Performed at [facility]   Report Status PENDING   Incomplete  CULTURE, BLOOD (ROUTINE X 2)     Status: None   Collection Time    07/17/14 11:35 AM      Result Value Ref Range Status   Specimen Description BLOOD LEFT HAND   Final   Special  Requests BOTTLES DRAWN AEROBIC AND ANAEROBIC 8CC   Final   Culture     Final   Value: GRAM NEGATIVE RODS     Gram Stain Report Called to,Read Back By and Verified With: SPERRY A AT [id number] ON [id number] AT [place] BY Mikel Cella K     Performed at [facility]   Report Status PENDING   Incomplete  URINE CULTURE     Status: None   Collection Time    07/17/14 12:13 PM      Result Value Ref Range Status   Specimen Description URINE, CATHETERIZED   Final   Special Requests NONE   Final   Culture  Setup Time     Final   Value: 07/17/2014 12:56     Performed at [place] PENDING   Incomplete   Culture     Final   Value: Culture reincubated for better growth     Performed at [facility]   Report Status PENDING   Incomplete  MRSA PCR SCREENING     Status: Abnormal   Collection Time    07/17/14  8:03 PM      Result Value Ref Range Status   MRSA by PCR POSITIVE (*) NEGATIVE Final   Comment:            The GeneXpert  MRSA Assay (FDA     approved for NASAL specimens     only), is one component of a     comprehensive MRSA colonization     surveillance program. It is not     intended to diagnose MRSA     infection nor to guide or     monitor treatment for     MRSA infections.     RESULT CALLED TO, READ BACK BY AND VERIFIED WITH:     JAMES,L RN [id number] 07/17/14 MITCHELL,L  CLOSTRIDIUM DIFFICILE BY PCR     Status: None   Collection Time    07/17/14  9:23 PM      Result Value Ref Range Status   C difficile by pcr NEGATIVE  NEGATIVE Final    Medical History: Past Medical History  Diagnosis Date  . Arthritis   . MS (multiple sclerosis)   . Insomnia   . PVD (peripheral vascular disease)   . Carotid artery stenosis   . TIA (transient ischemic attack)   . Chronic indwelling Foley catheter 10/06/2011  . Pulmonary fibrosis 10/06/2011  . Dysphagia 10/07/2011  . Tremors of nervous system 10/08/2011  . Hypothyroidism 10/08/2011  . Pulmonary nodule 10/08/2011  . C.  difficile colitis 09/2011  . Pneumonia   . Encephalopathy   . Urinary tract infection   . Tremors of nervous system   . Hypokalemia   . Junctional rhythm   . Anemia   . Hypernatremia   . Sacral ulcer   . HTN (hypertension), malignant 10/06/2011  . Sleep apnea   . Neuromuscular disorder   . Back pain, chronic   . Kidney stones   . High grade dysplasia in colonic adenoma 09/2005  . Gait disorder   . OSA (obstructive sleep apnea)   . Paroxysmal atrial tachycardia   . Dyslipidemia   . Cerebrovascular disease   . CAD (coronary artery disease)   . Peripheral vascular disease   . Bilateral carotid bruits   . Colon polyps   . HA (headache)     Medications:  Prescriptions prior to admission  Medication Sig Dispense Refill  . baclofen (LIORESAL) 10 MG tablet Take 10 mg by mouth 3 (three) times daily as needed for muscle spasms.      . diazepam (VALIUM) 5 MG tablet Take 1 tablet (5 mg total) by mouth 2 (two) times daily.  60 tablet  3  . HYDROcodone-acetaminophen ([place]) 7.5-325 MG per tablet Take 1 tablet by mouth 2 (two) times daily.      Marland Kitchen levothyroxine (SYNTHROID, LEVOTHROID) 50 MCG tablet Take 50 mcg by mouth daily.      . potassium chloride SA (K-DUR,KLOR-CON) 20 MEQ tablet Take 20 mEq by mouth daily.      . verapamil (CALAN) 40 MG tablet Take 40 mg by mouth Twice daily.       Assessment: 73 y.o. male presents with slurred speech, severe sepsis - thought to be due to urosepsis. Pt with chronic indwelling foley. Pt was diagnosed with UTI 3 wks ago and was given Cipro but had some issues taking it due to size of pills. Pt also with diarrhea but cdiff PCR negative. To broaden antibiotic coverage for GNR bacteremia by changing Rocephin to [place]. Wbc 14.5 - trending down. Tm 101.3, PCT 59, LA 6.6. SCr elevated to 3.91, est CrCl ~15 ml/min. Noted baseline SCr ~1  10/19 Vanc x1 10/19 Zosyn x1 10/19 Rocephin>>10/20 10/20 Ceftazidime>> 10/19 po vanc>>10/20 10/19 Flagyl>>10/20  10/19  Bldx2>>GNR 10/19  Urine>> MRSA PCR positive Cdiff PCR>>negative  Goal of Therapy:  Resolution of infection  Plan:  1. Fortaz 2gm IV q24h 2. Will f/u renal function, micro data, pt's clinical condition  Sherlon Handing, PharmD, BCPS Clinical pharmacist, pager [phone] 07/18/2014,12:48 PM

## 2014-07-18 NOTE — Consult Note (Signed)
Reason for Consult:Chronic Left Hydronephrosis, Acute Renal Failure, Urosepsis, Neurogenic Bladder, Urolithiasis  Referring Physician: Robert Byrum MD  Jordan Ward is an 73 y.o. male.   HPI:   1 - Chronic Left Hydronephrosis of Dominant Kidney - Left moderate hydro by CT this admission withtou ureteral dilation or ureteral stone on eval acute renal failure and urosepsis. Hydro stable and present since at least 2012.  2 -  Acute Renal Failure - Cr high 3.9's noted during current admission up from basleine of approx 1.0. Imaging with stable left hydro as per above.   3 -  Urosepsis - Pt admitted to [facility] ICU with fever, tachycardia, hyptentions as well as bacteruria and gram negative bacteremia. UCX pending. ON empiric Ceftaz.   4 - Neurogenic Bladder - s/p SP tube 2013 for urodynamically proven areflexic bladder. Gets SP changes Qmonthly by home health. Does have some occasional spasms with peri-tube leakage managed with anticholinergic prn.  5 -  Urolithiasis - several small renal and bladder stones by CT this admission, no obstructing stones or recent flank pain.   Today Jordan Ward is seen in consultation for above. He normally follows with Dr. Dahlstedt at [place] [facility].   Past Medical History  Diagnosis Date  . Arthritis   . MS (multiple sclerosis)   . Insomnia   . PVD (peripheral vascular disease)   . Carotid artery stenosis   . TIA (transient ischemic attack)   . Chronic indwelling Foley catheter 10/06/2011  . Pulmonary fibrosis 10/06/2011  . Dysphagia 10/07/2011  . Tremors of nervous system 10/08/2011  . Hypothyroidism 10/08/2011  . Pulmonary nodule 10/08/2011  . C. difficile colitis 09/2011  . Pneumonia   . Encephalopathy   . Urinary tract infection   . Tremors of nervous system   . Hypokalemia   . Junctional rhythm   . Anemia   . Hypernatremia   . Sacral ulcer   . HTN (hypertension), malignant 10/06/2011  . Sleep apnea   . Neuromuscular disorder   . Back pain, chronic   .  Kidney stones   . High grade dysplasia in colonic adenoma 09/2005  . Gait disorder   . OSA (obstructive sleep apnea)   . Paroxysmal atrial tachycardia   . Dyslipidemia   . Cerebrovascular disease   . CAD (coronary artery disease)   . Peripheral vascular disease   . Bilateral carotid bruits   . Colon polyps   . HA (headache)     Past Surgical History  Procedure Laterality Date  . Inguinal hernia repair  1971    bilateral  . Back surgery  1976/1979  . Colonoscopy  11/2004    Dr. Rehman-->large sessile polyp splenic flexure, 10mm sessile polyp desc colon, tubulovillous adenoma (bx not removed)  . Colonoscopy  01/2005    poor prep, polyp could not be found  . Colonoscopy  05/2005    with EMR, polypectomy Dr. John Gilliam, bx showed high grade dysplasia, partially resected  . Colonoscopy  09/2005    Dr. Gilliam, [place] ink tattooing, four villous colon polyp (3 had been missed on previous colonoscopies due to limitations of procedures  . Colon surgery  09/2005    Fleishman: four tubular adenomas, large adenomatous polyp with HIGH GRADE dysplasia  . Appendectomy  09/2005    at time of left hemicolectomy  . Colonoscopy  09/2006    normal TI, no polyps  . Colonoscopy  10/2007    Dr. Rourk-->rectal distal mammillations, benign bx, normal TI, random bx neg   for microscopic colitis  . Cholecystectomy      Dr. Smith  . Suprapubic catheter insertion      Family History  Problem Relation Age of Onset  . Colon cancer Neg Hx   . Cirrhosis Brother     etoh  . Stroke Mother 70  . Coronary artery disease Father 76  . Heart attack Brother   . Multiple sclerosis      Social History:  reports that he has quit smoking. His smoking use included Cigarettes. He smoked 0.00 packs per day. He has never used smokeless tobacco. He reports that he does not drink alcohol or use illicit drugs.  Allergies:  Allergies  Allergen Reactions  . Tetracyclines & Related Anaphylaxis and Rash  . Ciprofloxacin      Trouble swallowing    Medications: I have reviewed the patient's current medications.  Results for orders placed during the hospital encounter of 07/17/14 (from the past 48 hour(s))  CBC WITH DIFFERENTIAL     Status: Abnormal   Collection Time    07/17/14 11:21 AM      Result Value Ref Range   WBC 23.0 (*) 4.0 - 10.5 K/uL   RBC 4.19 (*) 4.22 - 5.81 MIL/uL   Hemoglobin 12.7 (*) 13.0 - 17.0 g/dL   HCT 38.2 (*) 39.0 - 52.0 %   MCV 91.2  78.0 - 100.0 fL   MCH 30.3  26.0 - 34.0 pg   MCHC 33.2  30.0 - 36.0 g/dL   RDW 14.6  11.5 - 15.5 %   Platelets 164  150 - 400 K/uL   Neutrophils Relative % 93 (*) 43 - 77 %   Neutro Abs 21.3 (*) 1.7 - 7.7 K/uL   Lymphocytes Relative 4 (*) 12 - 46 %   Lymphs Abs 1.0  0.7 - 4.0 K/uL   Monocytes Relative 3  3 - 12 %   Monocytes Absolute 0.7  0.1 - 1.0 K/uL   Eosinophils Relative 0  0 - 5 %   Eosinophils Absolute 0.0  0.0 - 0.7 K/uL   Basophils Relative 0  0 - 1 %   Basophils Absolute 0.0  0.0 - 0.1 K/uL   WBC Morphology INCREASED BANDS (>20% BANDS)    COMPREHENSIVE METABOLIC PANEL     Status: Abnormal   Collection Time    07/17/14 11:21 AM      Result Value Ref Range   Sodium 146  137 - 147 mEq/L   Potassium 3.6 (*) 3.7 - 5.3 mEq/L   Chloride 104  96 - 112 mEq/L   CO2 17 (*) 19 - 32 mEq/L   Glucose, Bld 106 (*) 70 - 99 mg/dL   BUN 49 (*) 6 - 23 mg/dL   Creatinine, Ser 3.91 (*) 0.50 - 1.35 mg/dL   Calcium 9.7  8.4 - 10.5 mg/dL   Total Protein 7.7  6.0 - 8.3 g/dL   Albumin 3.0 (*) 3.5 - 5.2 g/dL   AST 239 (*) 0 - 37 U/L   ALT 58 (*) 0 - 53 U/L   Alkaline Phosphatase 113  39 - 117 U/L   Total Bilirubin 1.2  0.3 - 1.2 mg/dL   GFR calc non Af Amer 14 (*) >90 mL/min   GFR calc Af Amer 16 (*) >90 mL/min   Comment: (NOTE)     The eGFR has been calculated using the CKD EPI equation.     This calculation has not been validated in all clinical situations.       eGFR's persistently <90 mL/min signify possible Chronic Kidney     Disease.  CULTURE,  BLOOD (ROUTINE X 2)     Status: None   Collection Time    07/17/14 11:29 AM      Result Value Ref Range   Specimen Description BLOOD RIGHT HAND     Special Requests BOTTLES DRAWN AEROBIC AND ANAEROBIC 12CC     Culture       Value: GRAM NEGATIVE RODS     Gram Stain Report Called to,Read Back By and Verified With: SPERRY A AT 0442 ON 102015 AT [place] BY FORSYTH K     Performed at [facility]   Report Status PENDING    LACTIC ACID, PLASMA     Status: Abnormal   Collection Time    07/17/14 11:29 AM      Result Value Ref Range   Lactic Acid, Venous 7.4 (*) 0.5 - 2.2 mmol/L  CULTURE, BLOOD (ROUTINE X 2)     Status: None   Collection Time    07/17/14 11:35 AM      Result Value Ref Range   Specimen Description BLOOD LEFT HAND     Special Requests BOTTLES DRAWN AEROBIC AND ANAEROBIC 8CC     Culture       Value: GRAM NEGATIVE RODS     Gram Stain Report Called to,Read Back By and Verified With: SPERRY A AT 0552 ON 102015 AT [place] BY FORSYTH K     Performed at [facility]   Report Status PENDING    URINE CULTURE     Status: None   Collection Time    07/17/14 12:13 PM      Result Value Ref Range   Specimen Description URINE, CATHETERIZED     Special Requests NONE     Culture  Setup Time       Value: 07/17/2014 12:56     Performed at [facility]   Colony Count PENDING     Culture       Value: Culture reincubated for better growth     Performed at [facility]   Report Status PENDING    URINALYSIS, ROUTINE W REFLEX MICROSCOPIC     Status: Abnormal   Collection Time    07/17/14 12:15 PM      Result Value Ref Range   Color, Urine YELLOW  YELLOW   APPearance HAZY (*) CLEAR   Specific Gravity, Urine 1.015  1.005 - 1.030   pH >9.0 (*) 5.0 - 8.0   Glucose, UA NEGATIVE  NEGATIVE mg/dL   Hgb urine dipstick LARGE (*) NEGATIVE   Bilirubin Urine NEGATIVE  NEGATIVE   Ketones, ur NEGATIVE  NEGATIVE mg/dL   Protein, ur 100 (*) NEGATIVE mg/dL    Urobilinogen, UA 0.2  0.0 - 1.0 mg/dL   Nitrite NEGATIVE  NEGATIVE   Leukocytes, UA SMALL (*) NEGATIVE  URINE MICROSCOPIC-ADD ON     Status: Abnormal   Collection Time    07/17/14 12:15 PM      Result Value Ref Range   Squamous Epithelial / LPF FEW (*) RARE   WBC, UA TOO NUMEROUS TO COUNT  <3 WBC/hpf   RBC / HPF 11-20  <3 RBC/hpf   Bacteria, UA MANY (*) RARE   Urine-Other AMORPHOUS URATES/PHOSPHATES    TROPONIN I     Status: Abnormal   Collection Time    07/17/14  3:33 PM      Result Value Ref Range     Troponin I 1.28 (*) <0.30 ng/mL   Comment:            Due to the release kinetics of cTnI,     a negative result within the first hours     of the onset of symptoms does not rule out     myocardial infarction with certainty.     If myocardial infarction is still suspected,     repeat the test at appropriate intervals.     CRITICAL RESULT CALLED TO, READ BACK BY AND VERIFIED WITH:     MEADOWS,G ON 07/17/14 AT 1645 BY LOY,C  PROTIME-INR     Status: Abnormal   Collection Time    07/17/14  3:33 PM      Result Value Ref Range   Prothrombin Time 19.3 (*) 11.6 - 15.2 seconds   INR 1.61 (*) 0.00 - 1.49  APTT     Status: Abnormal   Collection Time    07/17/14  3:33 PM      Result Value Ref Range   aPTT 38 (*) 24 - 37 seconds   Comment:            IF BASELINE aPTT IS ELEVATED,     SUGGEST PATIENT RISK ASSESSMENT     BE USED TO DETERMINE APPROPRIATE     ANTICOAGULANT THERAPY.  FIBRINOGEN     Status: Abnormal   Collection Time    07/17/14  3:33 PM      Result Value Ref Range   Fibrinogen 488 (*) 204 - 475 mg/dL  TYPE AND SCREEN     Status: None   Collection Time    07/17/14  3:33 PM      Result Value Ref Range   ABO/RH(D) O POS     Antibody Screen NEG     Sample Expiration 07/20/2014    CORTISOL     Status: None   Collection Time    07/17/14  3:42 PM      Result Value Ref Range   Cortisol, Plasma 43.3     Comment: (NOTE)     AM:  4.3 - 22.4 ug/dL     PM:  3.1 - 16.7  ug/dL     Performed at [facility] ACID, PLASMA     Status: Abnormal   Collection Time    07/17/14  3:42 PM      Result Value Ref Range   Lactic Acid, Venous 4.5 (*) 0.5 - 2.2 mmol/L  GLUCOSE, CAPILLARY     Status: Abnormal   Collection Time    07/17/14  7:57 PM      Result Value Ref Range   Glucose-Capillary 57 (*) 70 - 99 mg/dL  MRSA PCR SCREENING     Status: Abnormal   Collection Time    07/17/14  8:03 PM      Result Value Ref Range   MRSA by PCR POSITIVE (*) NEGATIVE   Comment:            The GeneXpert MRSA Assay (FDA     approved for NASAL specimens     only), is one component of a     comprehensive MRSA colonization     surveillance program. It is not     intended to diagnose MRSA     infection nor to guide or     monitor treatment for     MRSA infections.     RESULT CALLED TO, READ BACK BY AND VERIFIED WITH:  JAMES,L RN [id number] 07/17/14 MITCHELL,L  CBC     Status: Abnormal   Collection Time    07/17/14  8:32 PM      Result Value Ref Range   WBC 14.5 (*) 4.0 - 10.5 K/uL   RBC 3.70 (*) 4.22 - 5.81 MIL/uL   Hemoglobin 11.0 (*) 13.0 - 17.0 g/dL   HCT 34.1 (*) 39.0 - 52.0 %   MCV 92.2  78.0 - 100.0 fL   MCH 29.7  26.0 - 34.0 pg   MCHC 32.3  30.0 - 36.0 g/dL   RDW 14.9  11.5 - 15.5 %   Platelets 95 (*) 150 - 400 K/uL   Comment: REPEATED TO VERIFY     SPECIMEN CHECKED FOR CLOTS     PLATELETS APPEAR DECREASED     PLATELET COUNT CONFIRMED BY SMEAR  CREATININE, SERUM     Status: Abnormal   Collection Time    07/17/14  8:32 PM      Result Value Ref Range   Creatinine, Ser 3.96 (*) 0.50 - 1.35 mg/dL   GFR calc non Af Amer 14 (*) >90 mL/min   GFR calc Af Amer 16 (*) >90 mL/min   Comment: (NOTE)     The eGFR has been calculated using the CKD EPI equation.     This calculation has not been validated in all clinical situations.     eGFR's persistently <90 mL/min signify possible Chronic Kidney     Disease.  TROPONIN I     Status: Abnormal    Collection Time    07/17/14  8:32 PM      Result Value Ref Range   Troponin I 1.77 (*) <0.30 ng/mL   Comment:            Due to the release kinetics of cTnI,     a negative result within the first hours     of the onset of symptoms does not rule out     myocardial infarction with certainty.     If myocardial infarction is still suspected,     repeat the test at appropriate intervals.     CRITICAL RESULT CALLED TO, READ BACK BY AND VERIFIED WITH:     TOLER M,RN 07/17/14 2117 WAYK  LACTIC ACID, PLASMA     Status: Abnormal   Collection Time    07/17/14  8:32 PM      Result Value Ref Range   Lactic Acid, Venous 4.4 (*) 0.5 - 2.2 mmol/L  PROCALCITONIN     Status: None   Collection Time    07/17/14  8:32 PM      Result Value Ref Range   Procalcitonin 59.17     Comment:            Interpretation:     PCT >= 10 ng/mL:     Important systemic inflammatory response,     almost exclusively due to severe bacterial     sepsis or septic shock.     (NOTE)             ICU PCT Algorithm               Non ICU PCT Algorithm        ----------------------------     ------------------------------             PCT < 0.25 ng/mL                 PCT < 0.1 ng/mL  Stopping of antibiotics            Stopping of antibiotics           strongly encouraged.               strongly encouraged.        ----------------------------     ------------------------------           PCT level decrease by               PCT < 0.25 ng/mL           >= 80% from peak PCT           OR PCT 0.25 - 0.5 ng/mL          Stopping of antibiotics                                                 encouraged.         Stopping of antibiotics               encouraged.        ----------------------------     ------------------------------           PCT level decrease by              PCT >= 0.25 ng/mL           < 80% from peak PCT            AND PCT >= 0.5 ng/mL            Continuing antibiotics                                                   encouraged.           Continuing antibiotics                encouraged.        ----------------------------     ------------------------------         PCT level increase compared          PCT > 0.5 ng/mL             with peak PCT AND              PCT >= 0.5 ng/mL             Escalation of antibiotics                                              strongly encouraged.          Escalation of antibiotics            strongly encouraged.  BASIC METABOLIC PANEL     Status: Abnormal   Collection Time    07/17/14  8:32 PM      Result Value Ref Range   Sodium 146  137 - 147 mEq/L   Potassium 3.8  3.7 - 5.3 mEq/L   Chloride 110  96 - 112 mEq/L   CO2 17 (*) 19 - 32 mEq/L     Glucose, Bld 80  70 - 99 mg/dL   BUN 52 (*) 6 - 23 mg/dL   Creatinine, Ser 3.95 (*) 0.50 - 1.35 mg/dL   Calcium 8.1 (*) 8.4 - 10.5 mg/dL   GFR calc non Af Amer 14 (*) >90 mL/min   GFR calc Af Amer 16 (*) >90 mL/min   Comment: (NOTE)     The eGFR has been calculated using the CKD EPI equation.     This calculation has not been validated in all clinical situations.     eGFR's persistently <90 mL/min signify possible Chronic Kidney     Disease.   Anion gap 19 (*) 5 - 15  CLOSTRIDIUM DIFFICILE BY PCR     Status: None   Collection Time    07/17/14  9:23 PM      Result Value Ref Range   C difficile by pcr NEGATIVE  NEGATIVE  GLUCOSE, CAPILLARY     Status: Abnormal   Collection Time    07/17/14  9:28 PM      Result Value Ref Range   Glucose-Capillary 69 (*) 70 - 99 mg/dL  BLOOD GAS, ARTERIAL     Status: Abnormal   Collection Time    07/17/14  9:49 PM      Result Value Ref Range   FIO2 0.21     pH, Arterial 7.392  7.350 - 7.450   pCO2 arterial 25.1 (*) 35.0 - 45.0 mmHg   pO2, Arterial 56.3 (*) 80.0 - 100.0 mmHg   Bicarbonate 15.0 (*) 20.0 - 24.0 mEq/L   TCO2 15.7  0 - 100 mmol/L   Acid-base deficit 9.0 (*) 0.0 - 2.0 mmol/L   O2 Saturation 88.1     Patient temperature 98.6     Collection site RIGHT RADIAL      Drawn by [phone]     Sample type ARTERIAL DRAW     Allens test (pass/fail) PASS  PASS  GLUCOSE, CAPILLARY     Status: None   Collection Time    07/17/14 10:03 PM      Result Value Ref Range   Glucose-Capillary 89  70 - 99 mg/dL  GLUCOSE, CAPILLARY     Status: Abnormal   Collection Time    07/18/14 12:05 AM      Result Value Ref Range   Glucose-Capillary 53 (*) 70 - 99 mg/dL   Comment 1 Documented in Chart     Comment 2 Notify RN    GLUCOSE, CAPILLARY     Status: Abnormal   Collection Time    07/18/14  1:01 AM      Result Value Ref Range   Glucose-Capillary 120 (*) 70 - 99 mg/dL  TROPONIN I     Status: Abnormal   Collection Time    07/18/14  1:19 AM      Result Value Ref Range   Troponin I 3.12 (*) <0.30 ng/mL   Comment:            Due to the release kinetics of cTnI,     a negative result within the first hours     of the onset of symptoms does not rule out     myocardial infarction with certainty.     If myocardial infarction is still suspected,     repeat the test at appropriate intervals.     CRITICAL VALUE NOTED.  VALUE IS CONSISTENT WITH PREVIOUSLY REPORTED AND CALLED VALUE.  LACTIC ACID, PLASMA     Status: Abnormal  Collection Time    07/18/14  1:19 AM      Result Value Ref Range   Lactic Acid, Venous 6.6 (*) 0.5 - 2.2 mmol/L  PROCALCITONIN     Status: None   Collection Time    07/18/14  1:19 AM      Result Value Ref Range   Procalcitonin 59.15     Comment:            Interpretation:     PCT >= 10 ng/mL:     Important systemic inflammatory response,     almost exclusively due to severe bacterial     sepsis or septic shock.     (NOTE)             ICU PCT Algorithm               Non ICU PCT Algorithm        ----------------------------     ------------------------------             PCT < 0.25 ng/mL                 PCT < 0.1 ng/mL         Stopping of antibiotics            Stopping of antibiotics           strongly encouraged.               strongly  encouraged.        ----------------------------     ------------------------------           PCT level decrease by               PCT < 0.25 ng/mL           >= 80% from peak PCT           OR PCT 0.25 - 0.5 ng/mL          Stopping of antibiotics                                                 encouraged.         Stopping of antibiotics               encouraged.        ----------------------------     ------------------------------           PCT level decrease by              PCT >= 0.25 ng/mL           < 80% from peak PCT            AND PCT >= 0.5 ng/mL            Continuing antibiotics                                                  encouraged.           Continuing antibiotics                encouraged.        ----------------------------     ------------------------------  PCT level increase compared          PCT > 0.5 ng/mL             with peak PCT AND              PCT >= 0.5 ng/mL             Escalation of antibiotics                                              strongly encouraged.          Escalation of antibiotics            strongly encouraged.  DIC (DISSEMINATED INTRAVASCULAR COAGULATION) PANEL     Status: Abnormal   Collection Time    07/18/14  1:19 AM      Result Value Ref Range   Prothrombin Time 22.5 (*) 11.6 - 15.2 seconds   INR 1.96 (*) 0.00 - 1.49   aPTT 116 (*) 24 - 37 seconds   Comment:            IF BASELINE aPTT IS ELEVATED,     SUGGEST PATIENT RISK ASSESSMENT     BE USED TO DETERMINE APPROPRIATE     ANTICOAGULANT THERAPY.   Fibrinogen 456  204 - 475 mg/dL   D-Dimer, Quant 14.53 (*) 0.00 - 0.48 ug/mL-FEU   Comment:            AT THE INHOUSE ESTABLISHED CUTOFF     VALUE OF 0.48 ug/mL FEU,     THIS ASSAY HAS BEEN DOCUMENTED     IN THE LITERATURE TO HAVE     A SENSITIVITY AND NEGATIVE     PREDICTIVE VALUE OF AT LEAST     98 TO 99%.  THE TEST RESULT     SHOULD BE CORRELATED WITH     AN ASSESSMENT OF THE CLINICAL     PROBABILITY OF DVT / VTE.    Platelets 96 (*) 150 - 400 K/uL   Comment: CONSISTENT WITH PREVIOUS RESULT   Smear Review NO SCHISTOCYTES SEEN    PRO B NATRIURETIC PEPTIDE     Status: Abnormal   Collection Time    07/18/14  1:19 AM      Result Value Ref Range   Pro B Natriuretic peptide (BNP) >70000.0 (*) 0 - 125 pg/mL  COMPREHENSIVE METABOLIC PANEL     Status: Abnormal   Collection Time    07/18/14  1:19 AM      Result Value Ref Range   Sodium 149 (*) 137 - 147 mEq/L   Potassium 3.6 (*) 3.7 - 5.3 mEq/L   Chloride 114 (*) 96 - 112 mEq/L   CO2 16 (*) 19 - 32 mEq/L   Glucose, Bld 119 (*) 70 - 99 mg/dL   BUN 50 (*) 6 - 23 mg/dL   Creatinine, Ser 3.91 (*) 0.50 - 1.35 mg/dL   Calcium 7.4 (*) 8.4 - 10.5 mg/dL   Total Protein 5.7 (*) 6.0 - 8.3 g/dL   Albumin 2.1 (*) 3.5 - 5.2 g/dL   AST 197 (*) 0 - 37 U/L   ALT 60 (*) 0 - 53 U/L   Alkaline Phosphatase 78  39 - 117 U/L   Total Bilirubin 0.4  0.3 - 1.2 mg/dL   GFR calc non Af Amer 14 (*) >90 mL/min     GFR calc Af Amer 16 (*) >90 mL/min   Comment: (NOTE)     The eGFR has been calculated using the CKD EPI equation.     This calculation has not been validated in all clinical situations.     eGFR's persistently <90 mL/min signify possible Chronic Kidney     Disease.   Anion gap 19 (*) 5 - 15  BLOOD GAS, ARTERIAL     Status: Abnormal   Collection Time    07/18/14  3:08 AM      Result Value Ref Range   O2 Content 2.0     Delivery systems NASAL CANNULA     pH, Arterial 7.345 (*) 7.350 - 7.450   pCO2 arterial 27.8 (*) 35.0 - 45.0 mmHg   pO2, Arterial 87.5  80.0 - 100.0 mmHg   Bicarbonate 14.8 (*) 20.0 - 24.0 mEq/L   TCO2 15.6  0 - 100 mmol/L   Acid-base deficit 9.8 (*) 0.0 - 2.0 mmol/L   O2 Saturation 95.8     Patient temperature 98.6     Collection site RIGHT RADIAL     Drawn by [phone]     Sample type ARTERIAL DRAW     Allens test (pass/fail) PASS  PASS  GLUCOSE, CAPILLARY     Status: Abnormal   Collection Time    07/18/14  3:44 AM      Result Value Ref Range    Glucose-Capillary 115 (*) 70 - 99 mg/dL   Comment 1 Documented in Chart     Comment 2 Notify RN    TROPONIN I     Status: Abnormal   Collection Time    07/18/14  7:39 AM      Result Value Ref Range   Troponin I 4.93 (*) <0.30 ng/mL   Comment:            Due to the release kinetics of cTnI,     a negative result within the first hours     of the onset of symptoms does not rule out     myocardial infarction with certainty.     If myocardial infarction is still suspected,     repeat the test at appropriate intervals.     CRITICAL VALUE NOTED.  VALUE IS CONSISTENT WITH PREVIOUSLY REPORTED AND CALLED VALUE.  LACTIC ACID, PLASMA     Status: Abnormal   Collection Time    07/18/14  7:39 AM      Result Value Ref Range   Lactic Acid, Venous 3.2 (*) 0.5 - 2.2 mmol/L  MAGNESIUM     Status: None   Collection Time    07/18/14  7:39 AM      Result Value Ref Range   Magnesium 1.5  1.5 - 2.5 mg/dL  PHOSPHORUS     Status: None   Collection Time    07/18/14  7:39 AM      Result Value Ref Range   Phosphorus 3.1  2.3 - 4.6 mg/dL  COMPREHENSIVE METABOLIC PANEL     Status: Abnormal   Collection Time    07/18/14  7:39 AM      Result Value Ref Range   Sodium 146  137 - 147 mEq/L   Potassium 3.7  3.7 - 5.3 mEq/L   Chloride 114 (*) 96 - 112 mEq/L   CO2 15 (*) 19 - 32 mEq/L   Glucose, Bld 145 (*) 70 - 99 mg/dL   BUN 55 (*) 6 - 23 mg/dL   Creatinine, Ser  3.88 (*) 0.50 - 1.35 mg/dL   Calcium 7.0 (*) 8.4 - 10.5 mg/dL   Total Protein 5.3 (*) 6.0 - 8.3 g/dL   Albumin 1.9 (*) 3.5 - 5.2 g/dL   AST 151 (*) 0 - 37 U/L   ALT 55 (*) 0 - 53 U/L   Alkaline Phosphatase 73  39 - 117 U/L   Total Bilirubin 0.3  0.3 - 1.2 mg/dL   GFR calc non Af Amer 14 (*) >90 mL/min   GFR calc Af Amer 17 (*) >90 mL/min   Comment: (NOTE)     The eGFR has been calculated using the CKD EPI equation.     This calculation has not been validated in all clinical situations.     eGFR's persistently <90 mL/min signify possible  Chronic Kidney     Disease.   Anion gap 17 (*) 5 - 15  CBC WITH DIFFERENTIAL     Status: Abnormal (Preliminary result)   Collection Time    07/18/14  7:39 AM      Result Value Ref Range   WBC 19.7 (*) 4.0 - 10.5 K/uL   RBC 3.10 (*) 4.22 - 5.81 MIL/uL   Hemoglobin 9.3 (*) 13.0 - 17.0 g/dL   HCT 28.7 (*) 39.0 - 52.0 %   MCV 92.6  78.0 - 100.0 fL   MCH 30.0  26.0 - 34.0 pg   MCHC 32.4  30.0 - 36.0 g/dL   RDW 15.5  11.5 - 15.5 %   Platelets 82 (*) 150 - 400 K/uL   Comment: CONSISTENT WITH PREVIOUS RESULT   Neutrophils Relative % PENDING  43 - 77 %   Neutro Abs PENDING  1.7 - 7.7 K/uL   Band Neutrophils PENDING  0 - 10 %   Lymphocytes Relative PENDING  12 - 46 %   Lymphs Abs PENDING  0.7 - 4.0 K/uL   Monocytes Relative PENDING  3 - 12 %   Monocytes Absolute PENDING  0.1 - 1.0 K/uL   Eosinophils Relative PENDING  0 - 5 %   Eosinophils Absolute PENDING  0.0 - 0.7 K/uL   Basophils Relative PENDING  0 - 1 %   Basophils Absolute PENDING  0.0 - 0.1 K/uL   WBC Morphology PENDING     RBC Morphology PENDING     Smear Review PENDING     nRBC PENDING  0 /100 WBC   Metamyelocytes Relative PENDING     Myelocytes PENDING     Promyelocytes Absolute PENDING     Blasts PENDING    PROTIME-INR     Status: Abnormal   Collection Time    07/18/14  7:39 AM      Result Value Ref Range   Prothrombin Time 19.9 (*) 11.6 - 15.2 seconds   INR 1.68 (*) 0.00 - 1.49  LIPASE, BLOOD     Status: Abnormal   Collection Time    07/18/14  7:39 AM      Result Value Ref Range   Lipase 5 (*) 11 - 59 U/L  CK     Status: Abnormal   Collection Time    07/18/14  7:39 AM      Result Value Ref Range   Total CK 3719 (*) 7 - 232 U/L  GLUCOSE, CAPILLARY     Status: Abnormal   Collection Time    07/18/14  8:14 AM      Result Value Ref Range   Glucose-Capillary 52 (*) 70 - 99 mg/dL  Comment 1 Documented in Chart     Comment 2 Notify RN    GLUCOSE, CAPILLARY     Status: Abnormal   Collection Time    07/18/14   8:50 AM      Result Value Ref Range   Glucose-Capillary 192 (*) 70 - 99 mg/dL  GLUCOSE, CAPILLARY     Status: Abnormal   Collection Time    07/18/14 11:47 AM      Result Value Ref Range   Glucose-Capillary 116 (*) 70 - 99 mg/dL    Ct Abdomen Pelvis Wo Contrast  07/18/2014   CLINICAL DATA:  Increased weakness. Acute sepsis. Increasing lack today.  EXAM: CT ABDOMEN AND PELVIS WITHOUT CONTRAST  TECHNIQUE: Multidetector CT imaging of the abdomen and pelvis was performed following the standard protocol without IV contrast.  COMPARISON:  07/21/2011  FINDINGS: Small bilateral pleural effusions with atelectasis or infiltration in both lung bases. Coronary artery and aortic valve calcifications.  The left kidney is significantly enlarged in comparison to the right. There is left hydronephrosis and hydroureter. Stranding around the left kidney in ureter. No ureteral stones are demonstrated but 2 stones are shown in the bladder. There are multiple intrarenal stones in the left renal pelvis and lower pole right kidney. Additional calcifications in both kidneys are probably vascular. Low-attenuation lesions arising from the upper pole of the left kidney consistent with cysts and similar to previous study.  Surgical absence of the gallbladder. Mild bile duct dilatation. Vague increased density at the area of the distal common bile duct may represent an intraductal stone. The unenhanced appearance of the liver, spleen, pancreas, adrenal glands, inferior vena cava, and retroperitoneal lymph nodes is unremarkable. Vascular calcifications in the abdominal aorta without aneurysm. Stomach, small bowel, and colon are not abnormally distended. There is a broad-based anterior abdominal Burkett hernia which contains bowel but without evidence of compromise. No free air or free fluid in the abdomen.  Pelvis: Small amount of free fluid in the pelvis is nonspecific but likely reactive. Foley catheter decompresses the bladder. No  evidence of diverticulitis. Appendix is probably surgically absent. Degenerative changes in the lumbar spine. No destructive bone lesions appreciated.  IMPRESSION: Moderate obstruction of the left kidney and ureter. Bladder stones are present, probably representing recently passed stones. No definite distal ureteral stones otherwise identified. Bilateral intrarenal stones. Stones in the left renal pelvis. Foley catheter in the bladder. Small amount of free fluid in the pelvis.   Electronically Signed   By: William  Stevens M.D.   On: 07/18/2014 04:42   Dg Chest Port 1 View  07/18/2014   CLINICAL DATA:  Central line placement.  EXAM: PORTABLE CHEST - 1 VIEW  COMPARISON:  07/18/2014  FINDINGS: The right IJ central venous catheter tip is in the distal SVC approximately 2 cm above the cavoatrial junction. No complicating features. The heart and lungs are stable. No pneumothorax.  IMPRESSION: Right IJ central venous catheter tip is in the distal SVC. No complicating features.   Electronically Signed   By: Mark  Gallerani M.D.   On: 07/18/2014 12:04   Dg Chest Port 1 View  07/18/2014   CLINICAL DATA:  Sepsis, shortness of breath, personal history of multiple sclerosis, peripheral vascular disease, TIA, pulmonary fibrosis, hypertension, coronary artery disease  EXAM: PORTABLE CHEST - 1 VIEW  COMPARISON:  Portable exam 0441 hr compared to 07/17/2014  FINDINGS: Normal heart size, mediastinal contours and pulmonary vascularity.  Atherosclerotic calcification aorta.  Mild RIGHT basilar atelectasis.  Atelectasis   versus infiltrate developing in LEFT lower lobe.  Remaining lungs clear.  No definite pleural effusion or pneumothorax.  IMPRESSION: Mild RIGHT basilar atelectasis with developing infiltrate versus atelectasis in LEFT lower lobe.   Electronically Signed   By: Mark  Boles M.D.   On: 07/18/2014 07:48   Dg Chest Port 1 View  07/17/2014   CLINICAL DATA:  Recurrent urinary tract infections.  Fever.  EXAM:  PORTABLE CHEST - 1 VIEW  COMPARISON:  November 10, 2011.  FINDINGS: The heart size and mediastinal contours are within normal limits. Both lungs are clear. No pneumothorax or pleural effusion is noted. The visualized skeletal structures are unremarkable.  IMPRESSION: No acute cardiopulmonary abnormality seen.   Electronically Signed   By: James  Green M.D.   On: 07/17/2014 13:48   Dg Abd Portable 1v  07/17/2014   CLINICAL DATA:  73-year-old male with acute abdominal pain. Initial encounter.  EXAM: PORTABLE ABDOMEN - 1 VIEW  COMPARISON:  CT Abdomen and Pelvis 07/21/2011.  FINDINGS: Portable [facility] supine view at 2237 hrs. Calcified atherosclerosis including bilateral renal hilar calcifications. Superimposed nephrolithiasis. Stable cholecystectomy clips. Mild respiratory motion. Non obstructed bowel gas pattern. No acute osseous abnormality identified.  IMPRESSION: 1.  Non obstructed bowel gas pattern. 2. Chronic nephrolithiasis and vascular calcified atherosclerosis.   Electronically Signed   By: Lee  Hall M.D.   On: 07/17/2014 22:55    Review of Systems  Constitutional: Positive for fever, chills and malaise/fatigue.  HENT: Negative.   Eyes: Negative.   Respiratory: Negative.   Cardiovascular: Negative.  Negative for chest pain.  Gastrointestinal: Positive for diarrhea.  Genitourinary: Negative.   Musculoskeletal: Negative.   Neurological: Negative.   Endo/Heme/Allergies: Negative.   Psychiatric/Behavioral: Negative.    Blood pressure 125/58, pulse 91, temperature 98.2 F (36.8 C), temperature source Oral, resp. rate 25, height 5' 6" (1.676 m), weight 62 kg (136 lb 11 oz), SpO2 98.00%. Physical Exam  Constitutional:  Frail, ill-appearing, family at bedside. AO x 2.  HENT:  Head: Normocephalic.  Eyes: Pupils are equal, round, and reactive to light.  Neck: Normal range of motion.  Cardiovascular:  Mild tachycardia to 90s by bedside monitor  Respiratory: Effort normal.  GI: Soft.  Multiple  scars  Genitourinary:  No CVAT. SPT intact with sig leakage around.   Musculoskeletal: Normal range of motion.  Neurological: He is alert.  Psychiatric: He has a normal mood and affect. His behavior is normal. Judgment and thought content normal.   Using aseptic technique SPT balloon taken down and repositioned and inflated. Irrigated with 60cc aliquots steile water x 3 withtout issue confirming adequate placement.  Assessment/Plan:  1 - Chronic Left Hydronephrosis of Dominant Kidney - DDX phsiologic v. True obstructive. As amount of hydro stable x several years, favor physiologic. UOP improving with improvement on infectious parameters. Should GFR worsen or not improve or infectious parameters not improve, would favor perc neph tube to maximally drain.   2 -  Acute Renal Failure - Likely multifactorial favoring mostly pre-renal etiology with relative hypotension. Possible obstructive component as per above.   3 -  Urosepsis - tachycardia and fever curve improving on current antimicrobials.   4 - Neurogenic Bladder - SPT repositioned and working well. Some peri-tube leakage is likely unavoidable, do NOT recommend upsizing SP Tube.   5 -  Urolithiasis - no obstructing stones by imaging, observe in setting of infection.     ,  07/18/2014, 1:12 PM      

## 2014-07-18 NOTE — Procedures (Signed)
Central Venous Catheter Insertion Procedure Note Jordan Ward [id number] 09-10-1941  Procedure: Insertion of Central Venous Catheter Indications: Drug and/or fluid administration and Frequent blood sampling  Procedure Details Consent: Risks of procedure as well as the alternatives and risks of each were explained to the (patient/caregiver).  Consent for procedure obtained. Time Out: Verified patient identification, verified procedure, site/side was marked, verified correct patient position, special equipment/implants available, medications/allergies/relevent history reviewed, required imaging and test results available.  Performed Real time [place] was used to ID and cannulate the right IJ CVL.   Maximum sterile technique was used including antiseptics, cap, gloves, gown, hand hygiene, mask and sheet. Skin prep: Chlorhexidine; local anesthetic administered A antimicrobial bonded/coated triple lumen catheter was placed in the right internal jugular vein using the Seldinger technique.  Evaluation Blood flow good Complications: No apparent complications Patient did tolerate procedure well. Chest X-ray ordered to verify placement.  CXR: pending.  BABCOCK,PETE 07/18/2014, 11:25 AM  I was present for and supervised the entire procedure  Merton Border, MD ; [facility] service Mobile [phone].  After 5:30 PM or weekends, call [phone]

## 2014-07-18 NOTE — Progress Notes (Signed)
CRITICAL VALUE ALERT  Critical value received:  Blood cultures 2 aerobic bottles gram negative rods  Date of notification:  07/18/14  Time of notification:  0550  Critical value read back:yes  Nurse who received alert: A.Ezequiel Ganser RN  MD notified (1st page):  Deterding MD Time of first page:  0600  MD notified (2nd page):  Time of second page:  Responding MD:  Deterding MD Time MD responded:  0600

## 2014-07-19 DIAGNOSIS — T8351XD Infection and inflammatory reaction due to indwelling urinary catheter, subsequent encounter: Secondary | ICD-10-CM

## 2014-07-19 DIAGNOSIS — R7881 Bacteremia: Secondary | ICD-10-CM

## 2014-07-19 DIAGNOSIS — R6521 Severe sepsis with septic shock: Secondary | ICD-10-CM

## 2014-07-19 DIAGNOSIS — A4150 Gram-negative sepsis, unspecified: Secondary | ICD-10-CM

## 2014-07-19 DIAGNOSIS — N1330 Unspecified hydronephrosis: Secondary | ICD-10-CM

## 2014-07-19 LAB — CBC WITH DIFFERENTIAL/PLATELET
Basophils Absolute: 0 10*3/uL (ref 0.0–0.1)
Basophils Relative: 0 % (ref 0–1)
EOS ABS: 0.2 10*3/uL (ref 0.0–0.7)
EOS PCT: 1 % (ref 0–5)
HCT: 28.8 % — ABNORMAL LOW (ref 39.0–52.0)
Hemoglobin: 9.4 g/dL — ABNORMAL LOW (ref 13.0–17.0)
LYMPHS ABS: 1 10*3/uL (ref 0.7–4.0)
Lymphocytes Relative: 5 % — ABNORMAL LOW (ref 12–46)
MCH: 29.9 pg (ref 26.0–34.0)
MCHC: 32.6 g/dL (ref 30.0–36.0)
MCV: 91.7 fL (ref 78.0–100.0)
Monocytes Absolute: 0.6 10*3/uL (ref 0.1–1.0)
Monocytes Relative: 3 % (ref 3–12)
NEUTROS PCT: 91 % — AB (ref 43–77)
Neutro Abs: 18.9 10*3/uL — ABNORMAL HIGH (ref 1.7–7.7)
PLATELETS: 62 10*3/uL — AB (ref 150–400)
RBC: 3.14 MIL/uL — ABNORMAL LOW (ref 4.22–5.81)
RDW: 15.6 % — ABNORMAL HIGH (ref 11.5–15.5)
WBC: 20.7 10*3/uL — ABNORMAL HIGH (ref 4.0–10.5)

## 2014-07-19 LAB — GLUCOSE, CAPILLARY
GLUCOSE-CAPILLARY: 98 mg/dL (ref 70–99)
Glucose-Capillary: 103 mg/dL — ABNORMAL HIGH (ref 70–99)
Glucose-Capillary: 107 mg/dL — ABNORMAL HIGH (ref 70–99)
Glucose-Capillary: 112 mg/dL — ABNORMAL HIGH (ref 70–99)
Glucose-Capillary: 92 mg/dL (ref 70–99)

## 2014-07-19 LAB — PROCALCITONIN: PROCALCITONIN: 43.26 ng/mL

## 2014-07-19 MED ORDER — RESOURCE THICKENUP CLEAR PO POWD
ORAL | Status: DC | PRN
  Filled 2014-07-19: qty 125

## 2014-07-19 MED ORDER — CETYLPYRIDINIUM CHLORIDE 0.05 % MT LIQD
7.0000 mL | Freq: Two times a day (BID) | OROMUCOSAL | Status: DC
  Administered 2014-07-19 – 2014-07-20 (×2): 7 mL via OROMUCOSAL

## 2014-07-19 MED ORDER — METOPROLOL TARTRATE 12.5 MG HALF TABLET
12.5000 mg | ORAL_TABLET | Freq: Two times a day (BID) | ORAL | Status: DC
  Administered 2014-07-19 (×2): 12.5 mg via ORAL
  Filled 2014-07-19 (×4): qty 1

## 2014-07-19 NOTE — Progress Notes (Signed)
PULMONARY / CRITICAL CARE MEDICINE   Name: Jordan Ward MRN: [id number] DOB: Oct 11, 1940    ADMISSION DATE:  07/17/2014 CONSULTATION DATE:  07/19/2014  REFERRING MD :  EDP  CHIEF COMPLAINT:  Septic Shock  INITIAL PRESENTATION: 73 y.o. M brought to [facility] ED on 10/19 with slurred speech.  Had a fall the day prior but refused transport to ED.  On 10/19, had some slurring of speech and was complaining of abdominal pain in area of supra-pubic catheter.  He was apparently diagnosed with UTI 3 weeks ago but has had trouble taking his meds (Cipro) because of size of pills.  In ED, he was found to have UTI with severe sepsis and remained somewhat hypotensive after 3L IVF.  Request was made for transfer to [facility] ICU.    STUDIES:  10/20 KUB:  Non obstructed bowel gas pattern. Chronic nephrolithiasis and vascular calcified atherosclerosis. 10/20 CT chest/abd/pelvis: Moderate obstruction of the left kidney and ureter. Bladder stones are present, probably representing recently passed stones. No definite distal ureteral stones otherwise identified. Bilateral intrarenal stones. Stones in the left renal pelvis. Foley catheter in the bladder. Small amount of free fluid in the pelvis. 10/20 TTE: LVEF 30-35%. Mild LVH. Mild hypokinesis of the inferior myocardium. All new findings since 10/06/11   SIGNIFICANT EVENTS: 10/19 presented to [facility] ED, transferred to [facility] with severe sepsis. 10/19 Neuro consult: Slurred speech and increased weakness may be due to acute sepsis and associated renal failure. There is no clear evidence of acute exacerbation of MS at this point. 10/20 normotensive, episodic hypoglycemia 10/21 Off vasopressors. Speech remains slurred 10/21 SLP eval: D III with nectar thick  SUBJECTIVE:    No distress. No new complaints. Slurred speech  VITAL SIGNS: Temp:  [97.8 F (36.6 C)-98.5 F (36.9 C)] 98.1 F (36.7 C) (10/21 1517) Pulse Rate:  [38-98] 80 (10/21 1700) Resp:  [19-30] 20 (10/21 1700) BP:  (113-167)/(69-126) 166/85 mmHg (10/21 1700) SpO2:  [92 %-100 %] 100 % (10/21 1700) Weight:  [63.5 kg (139 lb 15.9 oz)] 63.5 kg (139 lb 15.9 oz) (10/21 0500) HEMODYNAMICS:   VENTILATOR SETTINGS:   INTAKE / OUTPUT: Intake/Output     10/20 0701 - 10/21 0700 10/21 0701 - 10/22 0700   I.V. (mL/kg) 2520 (39.7) 870 (13.7)   IV Piggyback 50 50   Total Intake(mL/kg) 2570 (40.5) 920 (14.5)   Urine (mL/kg/hr) 1495 (1) 1000 (1.5)   Stool 1 (0)    Total Output 1496 1000   Net +1074 -80        Urine Occurrence 2 x 1 x     PHYSICAL EXAMINATION: General: NAD  Neuro: diffusely weak, no focal deficits, slurred speech HEENT: WNL Cardiovascular: RRR s M Lungs: unlabored, clear.  Abdomen: soft, NT +BS  Ext: no edema, warm  LABS:  I have reviewed all of today's lab results. Relevant abnormalities are discussed in the A/P section  CXR: NNF  ASSESSMENT / PLAN:  PULMONARY A: Hx OSA Hx Pulmonary fibrosis P:   Supplemental O2 as needed  CARDIOVASCULAR A:  Septic shock, resolved Hx HTN  Hx HLD Hx CAD NSTEMI - likely demand ischemia P:  MAP goal > 60 mmHg Cont ASA Cont telemetry Consider beta blocker 10/22 Consider Cards eval 10/22 Recheck trop I 10/22 Recheck EKG 10/22  RENAL A:   AKI, nonoliguric - baseline Cr approx 1 Chronic suprapubic catheter Anion gap acidosis Resolving lactic acidosis Hypokalemia, resolved Hypernatremia, mild Chronic Nephrolitiasis L hydronephrosis Atrophic R kidney P:  Urology following Monitor BMET intermittently Monitor I/Os Correct electrolytes as indicated  GASTROINTESTINAL A:   Elevated transaminases - improving Antibiotic associated Diarrhea- not C Diff Dysphagia due to MS, acute illness P:   SUP: N/I Monitor LFTs intermittently D III with nectar thick - SLP following Cont probiotics  HEMATOLOGIC A:   Chronic anemia Thrombocytopenia - unclear etiology. Suspect low grade DIC P:  DVT px: SCDs - avoid heparins until HIT  panel back Monitor CBC intermittently Transfuse per usual ICU guidelines  F/U HIT panel results when available  INFECTIOUS A:   Severe Sepsis due to urinary tract source Gram negative bacteremia Diarrhea, C diff negative P:   BCx2 10/19- 2/2 GNR >>  UCx 10/19 >>  C Diff 10/19- negative  Metronidazole 10/19 >> 10/20 PO vanc 10/19 >> 10/20 Ceftriaxone 10/19 >> 10/20 Ceftaz 10/20 >>   PCT protocol ordered 10/19  ENDOCRINE A:   Hypothyroidism   Episodic hypoglycemia Normal adrenal stress response (cortisol 43 10/19) P:   Cont levothyroxine Cont dextrose in IVFs Monitor CBGs q 8 hrs and PRN No SSI  NEUROLOGIC A:   Multiple sclerosis - able to perform ADLs @ baseline Chronic back pain Slurred speech - likely due to exacerbation of MS symptoms in setting of shock P:   Neuro following MRI when stabilizes from acute critical illness   DERM A:  Left buttock stage 1 PU Sacrum stage 1 PU Righ buttock stage 3 PU Right ankle stage 3 PU  P Cont recs per Collins Wife and pt updated @ bedside     Merton Border, MD ; [facility] service Mobile [phone].  After 5:30 PM or weekends, call [phone]

## 2014-07-19 NOTE — Clinical Documentation Improvement (Signed)
MD's, NP's, and PA's   Noted documentation of "Urosepsis" which only means "UTI" when coded. Please clarify clinical condition that is the cause of "sepsis" if appropriate for this admission.  Thank you   Possible Clinical Conditions?  Sepsis due to UTI due to Suprapubic catheter  Sepsis with UTI  Other Condition   Cannot clinically Determine     Risk Factors: suprapubic catheter, recent dx of UTI failed outpatient treatment  Thank You, Ree Kida ,RN Clinical Documentation Specialist:  [place] Information Management

## 2014-07-19 NOTE — Progress Notes (Addendum)
Patient SBP in the 160s, they have trended past few hours, patient is more awake and more active, spoke with CCM Resident on unit about anti-hypertensive agent, will start Metoprolol 12.5mg  BID PO for now  Baseline HR is JR

## 2014-07-19 NOTE — Progress Notes (Signed)
Subjective:  1 - Chronic Left Hydronephrosis of Dominant Kidney - Left moderate hydro by CT this admission withtou ureteral dilation or ureteral stone on eval acute renal failure and urosepsis. Hydro stable and present since at least 2012.   2 - Acute Renal Failure - Cr high 3.9's noted during current admission up from basleine of approx 1.0. Imaging with stable left hydro as per above.   3 - Urosepsis - Pt admitted to [facility] ICU with fever, tachycardia, hyptentions as well as bacteruria and gram negative bacteremia. UCX pending. ON empiric Ceftaz. Most recent prior CX's psudomonas.  4 - Neurogenic Bladder - s/p SP tube 2013 for urodynamically proven areflexic bladder. Gets SP changes Qmonthly by home health. Does have some occasional spasms with peri-tube leakage managed with anticholinergic prn. Catheter manipulated this admission and working well in appropriate position.   5 - Urolithiasis - several small renal and bladder stones by CT this admission, no obstructing stones or recent flank pain.   Today Jordan Ward is seen in f/u above. Now afebrile and resolution of rigors.   Objective: Vital signs in last 24 hours: Temp:  [97.8 F (36.6 C)-98.5 F (36.9 C)] 98.1 F (36.7 C) (10/21 1517) Pulse Rate:  [38-98] 82 (10/21 1800) Resp:  [19-30] 23 (10/21 1800) BP: (113-167)/(69-126) 158/88 mmHg (10/21 1800) SpO2:  [92 %-100 %] 97 % (10/21 1800) Weight:  [63.5 kg (139 lb 15.9 oz)] 63.5 kg (139 lb 15.9 oz) (10/21 0500) Last BM Date: 07/18/14  Intake/Output from previous day: 10/20 0701 - 10/21 0700 In: 2570 [I.V.:2520; IV Piggyback:50] Out: [id number] [Urine:1495; Stool:1] Intake/Output this shift: Total I/O In: 995 [I.V.:945; IV Piggyback:50] Out: 1150 [Urine:1150]  General appearance: alert, cooperative and ill-appearing, but improved Eyes: wearing glasses Throat: lips, mucosa, and tongue normal; teeth and gums normal Back: symmetric, no curvature. ROM normal. No CVA tenderness. Resp:  non-labored Chest Mcdade: no tenderness Cardio: Improved tachycardia GI: soft, non-tender; bowel sounds normal; no masses,  no organomegaly and multiple scars Male genitalia: normal, SPT c/d/i with clear urine.  Extremities: extremities normal, atraumatic, no cyanosis or edema Pulses: 2+ and symmetric Skin: Skin color, texture, turgor normal. No rashes or lesions Lymph nodes: Cervical, supraclavicular, and axillary nodes normal. Neurologic: Grossly normal  Lab Results:   Recent Labs  07/18/14 0739 07/19/14 0216  WBC 19.7* 20.7*  HGB 9.3* 9.4*  HCT 28.7* 28.8*  PLT 82* 62*   BMET  Recent Labs  07/18/14 0119 07/18/14 0739  NA 149* 146  K 3.6* 3.7  CL 114* 114*  CO2 16* 15*  GLUCOSE 119* 145*  BUN 50* 55*  CREATININE 3.91* 3.88*  CALCIUM 7.4* 7.0*   PT/INR  Recent Labs  07/18/14 0119 07/18/14 0739  LABPROT 22.5* 19.9*  INR 1.96* 1.68*   ABG  Recent Labs  07/17/14 2149 07/18/14 0308  PHART 7.392 7.345*  HCO3 15.0* 14.8*    Studies/Results: Ct Abdomen Pelvis Wo Contrast  07/18/2014   CLINICAL DATA:  Increased weakness. Acute sepsis. Increasing lack today.  EXAM: CT ABDOMEN AND PELVIS WITHOUT CONTRAST  TECHNIQUE: Multidetector CT imaging of the abdomen and pelvis was performed following the standard protocol without IV contrast.  COMPARISON:  07/21/2011  FINDINGS: Small bilateral pleural effusions with atelectasis or infiltration in both lung bases. Coronary artery and aortic valve calcifications.  The left kidney is significantly enlarged in comparison to the right. There is left hydronephrosis and hydroureter. Stranding around the left kidney in ureter. No ureteral stones are demonstrated but 2 stones  are shown in the bladder. There are multiple intrarenal stones in the left renal pelvis and lower pole right kidney. Additional calcifications in both kidneys are probably vascular. Low-attenuation lesions arising from the upper pole of the left kidney consistent  with cysts and similar to previous study.  Surgical absence of the gallbladder. Mild bile duct dilatation. Vague increased density at the area of the distal common bile duct may represent an intraductal stone. The unenhanced appearance of the liver, spleen, pancreas, adrenal glands, inferior vena cava, and retroperitoneal lymph nodes is unremarkable. Vascular calcifications in the abdominal aorta without aneurysm. Stomach, small bowel, and colon are not abnormally distended. There is a broad-based anterior abdominal Velaquez hernia which contains bowel but without evidence of compromise. No free air or free fluid in the abdomen.  Pelvis: Small amount of free fluid in the pelvis is nonspecific but likely reactive. Foley catheter decompresses the bladder. No evidence of diverticulitis. Appendix is probably surgically absent. Degenerative changes in the lumbar spine. No destructive bone lesions appreciated.  IMPRESSION: Moderate obstruction of the left kidney and ureter. Bladder stones are present, probably representing recently passed stones. No definite distal ureteral stones otherwise identified. Bilateral intrarenal stones. Stones in the left renal pelvis. Foley catheter in the bladder. Small amount of free fluid in the pelvis.   Electronically Signed   By: Lucienne Capers M.D.   On: 07/18/2014 04:42   Dg Chest Port 1 View  07/18/2014   CLINICAL DATA:  Central line placement.  EXAM: PORTABLE CHEST - 1 VIEW  COMPARISON:  07/18/2014  FINDINGS: The right IJ central venous catheter tip is in the distal SVC approximately 2 cm above the cavoatrial junction. No complicating features. The heart and lungs are stable. No pneumothorax.  IMPRESSION: Right IJ central venous catheter tip is in the distal SVC. No complicating features.   Electronically Signed   By: Kalman Jewels M.D.   On: 07/18/2014 12:04   Dg Chest Port 1 View  07/18/2014   CLINICAL DATA:  Sepsis, shortness of breath, personal history of multiple  sclerosis, peripheral vascular disease, TIA, pulmonary fibrosis, hypertension, coronary artery disease  EXAM: PORTABLE CHEST - 1 VIEW  COMPARISON:  Portable exam 0441 hr compared to 07/17/2014  FINDINGS: Normal heart size, mediastinal contours and pulmonary vascularity.  Atherosclerotic calcification aorta.  Mild RIGHT basilar atelectasis.  Atelectasis versus infiltrate developing in LEFT lower lobe.  Remaining lungs clear.  No definite pleural effusion or pneumothorax.  IMPRESSION: Mild RIGHT basilar atelectasis with developing infiltrate versus atelectasis in LEFT lower lobe.   Electronically Signed   By: Lavonia Dana M.D.   On: 07/18/2014 07:48   Dg Abd Portable 1v  07/17/2014   CLINICAL DATA:  73 year old male with acute abdominal pain. Initial encounter.  EXAM: PORTABLE ABDOMEN - 1 VIEW  COMPARISON:  CT Abdomen and Pelvis 07/21/2011.  FINDINGS: Portable AP supine view at 2237 hrs. Calcified atherosclerosis including bilateral renal hilar calcifications. Superimposed nephrolithiasis. Stable cholecystectomy clips. Mild respiratory motion. Non obstructed bowel gas pattern. No acute osseous abnormality identified.  IMPRESSION: 1.  Non obstructed bowel gas pattern. 2. Chronic nephrolithiasis and vascular calcified atherosclerosis.   Electronically Signed   By: Lars Pinks M.D.   On: 07/17/2014 22:55    Anti-infectives: Anti-infectives   Start     Dose/Rate Route Frequency Ordered Stop   07/18/14 0900  cefTAZidime (FORTAZ) 1 g in dextrose 5 % 50 mL IVPB     1 g 100 mL/hr over 30 Minutes Intravenous  Every 24 hours 07/18/14 0831     07/18/14 0000  vancomycin (VANCOCIN) 50 mg/mL oral solution 500 mg  Status:  Discontinued     500 mg Oral 4 times per day 07/17/14 2149 07/18/14 0949   07/17/14 2200  metroNIDAZOLE (FLAGYL) IVPB 500 mg  Status:  Discontinued     500 mg 100 mL/hr over 60 Minutes Intravenous Every 8 hours 07/17/14 2149 07/18/14 0949   07/17/14 1515  vancomycin (VANCOCIN) IVPB 1000 mg/200 mL  premix     1,000 mg 200 mL/hr over 60 Minutes Intravenous  Once 07/17/14 1508 07/17/14 1652   07/17/14 1515  piperacillin-tazobactam (ZOSYN) IVPB 3.375 g     3.375 g 100 mL/hr over 30 Minutes Intravenous  Once 07/17/14 1508 07/17/14 1620   07/17/14 1400  cefTRIAXone (ROCEPHIN) 2 g in dextrose 5 % 50 mL IVPB  Status:  Discontinued     2 g 100 mL/hr over 30 Minutes Intravenous Every 24 hours 07/17/14 1339 07/18/14 0829   07/17/14 1315  cefTRIAXone (ROCEPHIN) 2 g in dextrose 5 % 50 mL IVPB  Status:  Discontinued     2 g 100 mL/hr over 30 Minutes Intravenous  Once 07/17/14 1313 07/17/14 1420      Assessment/Plan:  1 - Chronic Left Hydronephrosis of Dominant Kidney - DDX phsiologic v. True obstructive. As amount of hydro stable x several years, favor physiologic. UOP continuing to improve. Should GFR worsen or not improve or infectious parameters not improve, would favor perc neph tube to maximally drain.   2 - Acute Renal Failure - Likely multifactorial favoring mostly pre-renal etiology with relative hypotension. Possible obstructive component as per above. Has AM labs tomorrow.   3 - Urosepsis - tachycardia and fever curve improving on current antimicrobials.   4 - Neurogenic Bladder - SPT working well. Some peri-tube leakage is likely unavoidable, do NOT recommend upsizing SP Tube.   5 - Urolithiasis - no obstructing stones by imaging, observe in setting of infection.   Will follow.    [facility] Beya Tipps 07/19/2014

## 2014-07-19 NOTE — Evaluation (Signed)
Clinical/Bedside Swallow Evaluation Patient Details  Name: Jordan Ward MRN: [id number] Date of Birth: 1941/03/04  Today's Date: 07/19/2014 Time: [id number] SLP Time Calculation (min): 22 min  Past Medical History:  Past Medical History  Diagnosis Date  . Arthritis   . MS (multiple sclerosis)   . Insomnia   . PVD (peripheral vascular disease)   . Carotid artery stenosis   . TIA (transient ischemic attack)   . Chronic indwelling Foley catheter 10/06/2011  . Pulmonary fibrosis 10/06/2011  . Dysphagia 10/07/2011  . Tremors of nervous system 10/08/2011  . Hypothyroidism 10/08/2011  . Pulmonary nodule 10/08/2011  . C. difficile colitis 09/2011  . Pneumonia   . Encephalopathy   . Urinary tract infection   . Tremors of nervous system   . Hypokalemia   . Junctional rhythm   . Anemia   . Hypernatremia   . Sacral ulcer   . HTN (hypertension), malignant 10/06/2011  . Sleep apnea   . Neuromuscular disorder   . Back pain, chronic   . Kidney stones   . High grade dysplasia in colonic adenoma 09/2005  . Gait disorder   . OSA (obstructive sleep apnea)   . Paroxysmal atrial tachycardia   . Dyslipidemia   . Cerebrovascular disease   . CAD (coronary artery disease)   . Peripheral vascular disease   . Bilateral carotid bruits   . Colon polyps   . HA (headache)    Past Surgical History:  Past Surgical History  Procedure Laterality Date  . Inguinal hernia repair  1971    bilateral  . Back surgery  1976/1979  . Colonoscopy  11/2004    Dr. Sharol Roussel sessile polyp splenic flexure, 10mm sessile polyp desc colon, tubulovillous adenoma (bx not removed)  . Colonoscopy  01/2005    poor prep, polyp could not be found  . Colonoscopy  05/2005    with EMR, polypectomy Dr. Olegario Messier, bx showed high grade dysplasia, partially resected  . Colonoscopy  09/2005    Dr. Arsenio Loader, [place] ink tattooing, four villous colon polyp (3 had been missed on previous colonoscopies due to limitations of procedures  .  Colon surgery  09/2005    Fleishman: four tubular adenomas, large adenomatous polyp with HIGH GRADE dysplasia  . Appendectomy  09/2005    at time of left hemicolectomy  . Colonoscopy  09/2006    normal TI, no polyps  . Colonoscopy  10/2007    Dr. Imogene Burn distal mammillations, benign bx, normal TI, random bx neg for microscopic colitis  . Cholecystectomy      Dr. Tamala Julian  . Suprapubic catheter insertion     HPI:  73 y.o. male with hx of long-standing MS brought to [facility] ED on 10/19 with slurred speech.  Dx of severe sepsis. Pt has a hx of variable neurogenic dysphagia.  Last MBS studies were at [facility] in Jan and Feb of 2013, the latter of which recommended nectar-liquids/modified diet secondary to aspiration.  Per wife, dysphagia resolved and pt has been eating a regular diet with thin liquids.      Assessment / Plan / Recommendation Clinical Impression  Pt presents with decompensated swallow function subsequent to acute medical illness.  Notable is generalized oral-motor weakness, increased effort to swallow, increased RR post-swallow, multiple s/s of increased dysphagia with acute aspiration risk.  Speech with concomitant deterioration in clarity.  Recommend downgrading diet to dysphagia 3 with honey-thick liquids; crush meds with puree; pt will likely need repeat instrumental exam to determine best  way to maximize function.  Rec acute care SLP f/u; pt may benefit from OT/PT evals when ready.     Aspiration Risk  Moderate    Diet Recommendation Dysphagia 3 (Mechanical Soft);Nectar-thick liquid   Liquid Administration via: Cup Medication Administration: Crushed with puree Compensations: Slow rate;Small sips/bites Postural Changes and/or Swallow Maneuvers: Seated upright 90 degrees    Other  Recommendations Oral Care Recommendations: Oral care BID Other Recommendations: Order thickener from pharmacy   Follow Up Recommendations   (tba)    Frequency and Duration min 2x/week  2 weeks        SLP Swallow Goals     Swallow Study Prior Functional Status       General Date of Onset: 07/17/14 HPI: 73 y.o. male with hx of long-standing MS brought to [facility] ED on 10/19 with slurred speech. Had a fall the day prior but refused transport to ED.  Dx of severe sepsis. Pt has a hx of variable neurogenic dysphagia.  Last MBS studies were at [facility] in Jan and Feb of 2013, the latter of which recommended nectar-liquids/modified diet secondary to aspiration.  Per wife, dysphagia resolved and pt has been eating a regular diet with thin liquids.    Type of Study: Bedside swallow evaluation Previous Swallow Assessment: 2/13 Diet Prior to this Study: Regular;Nectar-thick liquids Temperature Spikes Noted: No Respiratory Status: Room air Behavior/Cognition: Alert Self-Feeding Abilities: Able to feed self Patient Positioning: Upright in bed Baseline Vocal Quality: Low vocal intensity Volitional Cough: Strong Volitional Swallow: Able to elicit    Oral/Motor/Sensory Function Overall Oral Motor/Sensory Function: Impaired (bilateral weakness oral musculature)   Ice Chips Ice chips: Impaired Presentation: Spoon Oral Phase Functional Implications: Prolonged oral transit Pharyngeal Phase Impairments: Decreased hyoid-laryngeal movement;Cough - Delayed;Change in Vital Signs   Thin Liquid Thin Liquid: Impaired Presentation: Cup Pharyngeal  Phase Impairments: Suspected delayed Swallow;Decreased hyoid-laryngeal movement;Cough - Immediate    Nectar Thick Nectar Thick Liquid: Not tested   Honey Thick Honey Thick Liquid: Impaired Presentation: Cup Pharyngeal Phase Impairments: Suspected delayed Swallow;Decreased hyoid-laryngeal movement   Puree Puree: Impaired Presentation: Spoon Pharyngeal Phase Impairments: Suspected delayed Swallow;Multiple swallows;Decreased hyoid-laryngeal movement   Solid  Marthena Whitmyer L. [place], [place] CCC/SLP Pager [phone]     Solid: Not tested (pt declined)       Juan Quam  Laurice 07/19/2014,4:26 PM

## 2014-07-20 ENCOUNTER — Inpatient Hospital Stay (HOSPITAL_COMMUNITY): Payer: Medicare Other

## 2014-07-20 DIAGNOSIS — I420 Dilated cardiomyopathy: Secondary | ICD-10-CM

## 2014-07-20 DIAGNOSIS — E876 Hypokalemia: Secondary | ICD-10-CM

## 2014-07-20 DIAGNOSIS — R7989 Other specified abnormal findings of blood chemistry: Secondary | ICD-10-CM

## 2014-07-20 DIAGNOSIS — I10 Essential (primary) hypertension: Secondary | ICD-10-CM

## 2014-07-20 LAB — CBC WITH DIFFERENTIAL/PLATELET
BASOS PCT: 0 % (ref 0–1)
Basophils Absolute: 0.1 10*3/uL (ref 0.0–0.1)
EOS ABS: 0.1 10*3/uL (ref 0.0–0.7)
Eosinophils Relative: 1 % (ref 0–5)
HCT: 29.3 % — ABNORMAL LOW (ref 39.0–52.0)
HEMOGLOBIN: 10 g/dL — AB (ref 13.0–17.0)
Lymphocytes Relative: 8 % — ABNORMAL LOW (ref 12–46)
Lymphs Abs: 1.7 10*3/uL (ref 0.7–4.0)
MCH: 29.5 pg (ref 26.0–34.0)
MCHC: 34.1 g/dL (ref 30.0–36.0)
MCV: 86.4 fL (ref 78.0–100.0)
Monocytes Absolute: 0.7 10*3/uL (ref 0.1–1.0)
Monocytes Relative: 3 % (ref 3–12)
NEUTROS ABS: 19.8 10*3/uL — AB (ref 1.7–7.7)
Neutrophils Relative %: 88 % — ABNORMAL HIGH (ref 43–77)
PLATELETS: 57 10*3/uL — AB (ref 150–400)
RBC: 3.39 MIL/uL — AB (ref 4.22–5.81)
RDW: 15.1 % (ref 11.5–15.5)
WBC: 22.4 10*3/uL — ABNORMAL HIGH (ref 4.0–10.5)

## 2014-07-20 LAB — COMPREHENSIVE METABOLIC PANEL
ALBUMIN: 1.9 g/dL — AB (ref 3.5–5.2)
ALT: 52 U/L (ref 0–53)
ANION GAP: 14 (ref 5–15)
AST: 54 U/L — AB (ref 0–37)
Alkaline Phosphatase: 89 U/L (ref 39–117)
BUN: 53 mg/dL — AB (ref 6–23)
CALCIUM: 7.8 mg/dL — AB (ref 8.4–10.5)
CO2: 19 mEq/L (ref 19–32)
CREATININE: 3.1 mg/dL — AB (ref 0.50–1.35)
Chloride: 113 mEq/L — ABNORMAL HIGH (ref 96–112)
GFR calc Af Amer: 22 mL/min — ABNORMAL LOW (ref >90)
GFR calc non Af Amer: 19 mL/min — ABNORMAL LOW (ref >90)
Glucose, Bld: 115 mg/dL — ABNORMAL HIGH (ref 70–99)
Potassium: 2.9 mEq/L — CL (ref 3.7–5.3)
Sodium: 146 mEq/L (ref 137–147)
TOTAL PROTEIN: 5.8 g/dL — AB (ref 6.0–8.3)
Total Bilirubin: 0.4 mg/dL (ref 0.3–1.2)

## 2014-07-20 LAB — BASIC METABOLIC PANEL
Anion gap: 15 (ref 5–15)
BUN: 52 mg/dL — AB (ref 6–23)
CALCIUM: 8 mg/dL — AB (ref 8.4–10.5)
CO2: 19 mEq/L (ref 19–32)
CREATININE: 2.85 mg/dL — AB (ref 0.50–1.35)
Chloride: 111 mEq/L (ref 96–112)
GFR calc non Af Amer: 21 mL/min — ABNORMAL LOW (ref >90)
GFR, EST AFRICAN AMERICAN: 24 mL/min — AB (ref >90)
Glucose, Bld: 103 mg/dL — ABNORMAL HIGH (ref 70–99)
POTASSIUM: 2.8 meq/L — AB (ref 3.7–5.3)
Sodium: 145 mEq/L (ref 137–147)

## 2014-07-20 LAB — CULTURE, BLOOD (ROUTINE X 2)

## 2014-07-20 LAB — HEPARIN INDUCED THROMBOCYTOPENIA PNL
Heparin Induced Plt Ab: NEGATIVE
PATIENT O. D.: 0.15
UFH High Dose UFH H: 0 % Release
UFH LOW DOSE 0.1 IU/ML: 0 %
UFH LOW DOSE 0.5 IU/ML: 0 %
UFH SRA RESULT: NEGATIVE

## 2014-07-20 LAB — TROPONIN I: Troponin I: 3.41 ng/mL (ref <0.30)

## 2014-07-20 LAB — URINE CULTURE: Colony Count: >=100000

## 2014-07-20 LAB — GLUCOSE, CAPILLARY
GLUCOSE-CAPILLARY: 134 mg/dL — AB (ref 70–99)
GLUCOSE-CAPILLARY: 82 mg/dL (ref 70–99)
Glucose-Capillary: 84 mg/dL (ref 70–99)
Glucose-Capillary: 67 mg/dL — ABNORMAL LOW (ref 70–99)
Glucose-Capillary: 141 mg/dL — ABNORMAL HIGH (ref 70–99)

## 2014-07-20 LAB — LACTIC ACID, PLASMA: LACTIC ACID, VENOUS: 1.7 mmol/L (ref 0.5–2.2)

## 2014-07-20 MED ORDER — POTASSIUM CHLORIDE 10 MEQ/50ML IV SOLN
10.0000 meq | INTRAVENOUS | Status: AC
  Administered 2014-07-20 (×2): 10 meq via INTRAVENOUS
  Filled 2014-07-20: qty 50

## 2014-07-20 MED ORDER — SODIUM CHLORIDE 0.9 % IV SOLN
250.0000 mg | Freq: Three times a day (TID) | INTRAVENOUS | Status: DC
  Administered 2014-07-20 – 2014-07-26 (×19): 250 mg via INTRAVENOUS
  Filled 2014-07-20 (×25): qty 250

## 2014-07-20 MED ORDER — DEXTROSE 50 % IV SOLN
INTRAVENOUS | Status: AC
  Administered 2014-07-20: 50 mL
  Filled 2014-07-20: qty 50

## 2014-07-20 MED ORDER — POTASSIUM CHLORIDE 10 MEQ/100ML IV SOLN
10.0000 meq | INTRAVENOUS | Status: AC
  Administered 2014-07-20 – 2014-07-21 (×4): 10 meq via INTRAVENOUS
  Filled 2014-07-20 (×4): qty 100

## 2014-07-20 MED ORDER — SODIUM CHLORIDE 0.9 % IV SOLN: 500.0000 mg | Freq: Three times a day (TID) | INTRAVENOUS | Status: DC

## 2014-07-20 MED ORDER — AMLODIPINE BESYLATE 5 MG PO TABS
5.0000 mg | ORAL_TABLET | Freq: Every day | ORAL | Status: DC
  Administered 2014-07-20 – 2014-07-26 (×7): 5 mg via ORAL
  Filled 2014-07-20 (×7): qty 1

## 2014-07-20 MED ORDER — METOPROLOL TARTRATE 25 MG PO TABS
25.0000 mg | ORAL_TABLET | Freq: Two times a day (BID) | ORAL | Status: DC
  Administered 2014-07-20 (×2): 25 mg via ORAL
  Filled 2014-07-20 (×4): qty 1

## 2014-07-20 MED ORDER — POTASSIUM CHLORIDE 10 MEQ/50ML IV SOLN
INTRAVENOUS | Status: AC
  Filled 2014-07-20: qty 50

## 2014-07-20 MED ORDER — ENSURE PUDDING PO PUDG
1.0000 | Freq: Three times a day (TID) | ORAL | Status: DC
  Administered 2014-07-20 – 2014-07-26 (×16): 1 via ORAL

## 2014-07-20 NOTE — Evaluation (Signed)
Physical Therapy Evaluation Patient Details Name: Jordan Ward MRN: [id number] DOB: 1941-02-24 Today's Date: 07/20/2014   History of Present Illness  Pt admitted to [facility] on 10/19 with slurred speech and abdominal pain.  Pt with sepsis due to UTI, hypotensive.  PMH:  MS, HTN, CAD, NSTEMI, c diff.  Clinical Impression  Pt admitted with above. Pt currently with functional limitations due to the deficits listed below (see PT Problem List). Pt is a good rehab candidate.   Pt will benefit from skilled PT to increase their independence and safety with mobility to allow discharge to the venue listed below.     Follow Up Recommendations CIR    Equipment Recommendations  Other (comment) (TBA)    Recommendations for Other Services Rehab consult     Precautions / Restrictions Precautions Precautions: Fall Precaution Comments: suprapubic catheter, flexiseal      Mobility  Bed Mobility Overal bed mobility: +2 for physical assistance;Needs Assistance Bed Mobility: Supine to Sit     Supine to sit: +2 for physical assistance;Mod assist        Transfers Overall transfer level: Needs assistance Equipment used: 2 person hand held assist Transfers: Stand Pivot Transfers;Sit to/from Stand Sit to Stand: +2 physical assistance;Mod assist Stand pivot transfers: +2 physical assistance;Min assist       General transfer comment: Pt needed cues for hand placement.  Pt able to stand with mod assist and needed min a to weiight shift side to side for pivot transfer.    Ambulation/Gait                Stairs            Wheelchair Mobility    Modified Rankin (Stroke Patients Only)       Balance Overall balance assessment: Needs assistance;History of Falls Sitting-balance support: No upper extremity supported;Feet supported Sitting balance-Leahy Scale: Fair     Standing balance support: Bilateral upper extremity supported;During functional activity Standing  balance-Leahy Scale: Poor Standing balance comment: Needs UE support for balance. Needed steadying assist and 2 person assist.                               Pertinent Vitals/Pain Pain Assessment: No/denies pain VSS    Home Living Family/patient expects to be discharged to:: Private residence Living Arrangements: Spouse/significant other Available Help at Discharge: Family;Available 24 hours/day Type of Home: House Home Access: Stairs to enter Entrance Stairs-Rails: Right Entrance Stairs-Number of Steps: 2 Home Layout: One level Home Equipment: Grab bars - toilet;Walker - 2 wheels      Prior Function Level of Independence: Independent with assistive device(s)         Comments: helped with dishes, laundry, sponge bathed, dressed independently     Hand Dominance   Dominant Hand: Left (pt reports using both hands equally)    Extremity/Trunk Assessment   Upper Extremity Assessment: Defer to OT evaluation           Lower Extremity Assessment: RLE deficits/detail;LLE deficits/detail RLE Deficits / Details: grossly 2+/5 LLE Deficits / Details: grossly 2+/5  Cervical / Trunk Assessment: Kyphotic  Communication   Communication: No difficulties (slightly slurred)  Cognition Arousal/Alertness: Awake/alert Behavior During Therapy: WFL for tasks assessed/performed Overall Cognitive Status: History of cognitive impairments - at baseline       Memory: Decreased short-term memory              General Comments  Exercises        Assessment/Plan    PT Assessment Patient needs continued PT services  PT Diagnosis Difficulty walking;Generalized weakness   PT Problem List Decreased balance;Decreased activity tolerance;Decreased strength;Decreased mobility;Decreased knowledge of use of DME;Decreased safety awareness;Decreased knowledge of precautions  PT Treatment Interventions DME instruction;Gait training;Functional mobility training;Therapeutic  activities;Therapeutic exercise;Balance training;Patient/family education   PT Goals (Current goals can be found in the Care Plan section) Acute Rehab PT Goals Patient Stated Goal: Return to PLOF. PT Goal Formulation: With patient Time For Goal Achievement: 07/27/14 Potential to Achieve Goals: Good    Frequency Min 3X/week   Barriers to discharge Decreased caregiver support      Co-evaluation PT/OT/SLP Co-Evaluation/Treatment: Yes Reason for Co-Treatment: Complexity of the patient's impairments (multi-system involvement) PT goals addressed during session: Mobility/safety with mobility OT goals addressed during session: Strengthening/ROM       End of Session Equipment Utilized During Treatment: Gait belt Activity Tolerance: Patient limited by fatigue Patient left: in chair;with call bell/phone within reach;with family/visitor present Nurse Communication: Mobility status         Time: 1206-1228 PT Time Calculation (min): 22 min   Charges:   PT Evaluation $Initial PT Evaluation Tier I: 1 Procedure PT Treatments $Therapeutic Activity: 8-22 mins   PT G CodesDenice Paradise 2014/08/11, 2:21 PM Darria Corvera,PT Acute Rehabilitation [phone] (pager)

## 2014-07-20 NOTE — Procedures (Addendum)
Objective Swallowing Evaluation: Modified barium swallow evaluation  Patient Details  Name: Jordan Ward MRN: [id number] Date of Birth: Nov 08, 1940  Today's Date: 07/20/2014 Time: 1030-1100 SLP Time Calculation (min): 30 min  Past Medical History:  Past Medical History  Diagnosis Date  . Arthritis   . MS (multiple sclerosis)   . Insomnia   . PVD (peripheral vascular disease)   . Carotid artery stenosis   . TIA (transient ischemic attack)   . Chronic indwelling Foley catheter 10/06/2011  . Pulmonary fibrosis 10/06/2011  . Dysphagia 10/07/2011  . Tremors of nervous system 10/08/2011  . Hypothyroidism 10/08/2011  . Pulmonary nodule 10/08/2011  . C. difficile colitis 09/2011  . Pneumonia   . Encephalopathy   . Urinary tract infection   . Tremors of nervous system   . Hypokalemia   . Junctional rhythm   . Anemia   . Hypernatremia   . Sacral ulcer   . HTN (hypertension), malignant 10/06/2011  . Sleep apnea   . Neuromuscular disorder   . Back pain, chronic   . Kidney stones   . High grade dysplasia in colonic adenoma 09/2005  . Gait disorder   . OSA (obstructive sleep apnea)   . Paroxysmal atrial tachycardia   . Dyslipidemia   . Cerebrovascular disease   . CAD (coronary artery disease)   . Peripheral vascular disease   . Bilateral carotid bruits   . Colon polyps   . HA (headache)    Past Surgical History:  Past Surgical History  Procedure Laterality Date  . Inguinal hernia repair  1971    bilateral  . Back surgery  1976/1979  . Colonoscopy  11/2004    Dr. Sharol Roussel sessile polyp splenic flexure, 52mm sessile polyp desc colon, tubulovillous adenoma (bx not removed)  . Colonoscopy  01/2005    poor prep, polyp could not be found  . Colonoscopy  05/2005    with EMR, polypectomy Dr. Olegario Messier, bx showed high grade dysplasia, partially resected  . Colonoscopy  09/2005    Dr. Arsenio Loader, [place] ink tattooing, four villous colon polyp (3 had been missed on previous colonoscopies due to  limitations of procedures  . Colon surgery  09/2005    Fleishman: four tubular adenomas, large adenomatous polyp with HIGH GRADE dysplasia  . Appendectomy  09/2005    at time of left hemicolectomy  . Colonoscopy  09/2006    normal TI, no polyps  . Colonoscopy  10/2007    Dr. Imogene Burn distal mammillations, benign bx, normal TI, random bx neg for microscopic colitis  . Cholecystectomy      Dr. Tamala Julian  . Suprapubic catheter insertion     HPI:   73 y.o. male with hx of long-standing MS brought to [facility] ED on 10/19 with slurred speech. Dx of severe sepsis. Pt has a hx of variable neurogenic dysphagia. Last MBS studies were at [facility] in Jan and Feb of 2013, the latter of which recommended nectar-liquids/modified diet secondary to aspiration. Per report, dysphagia resolved and pt has been eating a regular diet with thin liquids.  Pt initiated on Dys 3 solids and honey thick liquids on 10/21 due to overt s/s of aspiration noted at bedside.  MBS ordered 10/22 to objectively determine safest diet consistencies.       Assessment / Plan / Recommendation Clinical Impression  Dysphagia Diagnosis: Mild pharyngeal phase dysphagia;Moderate pharyngeal phase dysphagia;Mild oral phase dysphagia Pt presents with a moderate multifactorial dysphagia with both sensory and motor impairments.  Pt presents  with generalized oral motor weakness which resulted in oral residue post swallow and prolonged transit of materials into the oropharynx.  Pt exhibited a delayed swallow response due to decreased pharyngeal sensation and overall poor control of boluses which resulted in silent aspiration of large cup sips of nectar thick liquids.  Attempts to better control boluses with small cup sips or teaspoons of nectar thick liquids slightly improved airway protection but still resulted in silent penetration.  Additional pharyngeal phase impairments were characterized by decreased base of tongue retraction, poor tongue to palate contact,  and decreased laryngeal elevation which resulted in inconsistent amounts of pharyngeal residue across all consistencies.  SLP also suspects some component of esophageal dysphagia impacting pharyngeal residue due to pooling of materials at the UES and intermittent backflow of materials into the cervical esophagus.  The abovementioned functional impairments appeared to resolve with increased time between bites/sips Therefore, recommend pt continue on Dys 3 diet with honey thick liquids, meds crushed in puree, full supervision for use of the following swallowing precautions: slow rate, small bites/sips, and alternate solids and liquids.  Prognosis for advancement good with trials of advanced consistencies and pharyngeal strengthening exercises.  Recommend repeat MBS to objectively determine readiness for diet advancement.     Treatment Recommendation  Therapy as outlined in treatment plan below    Diet Recommendation Honey-thick liquid;Dysphagia 3 (Mechanical Soft)   Liquid Administration via: Cup Medication Administration: Crushed with puree Supervision: Patient able to self feed;Full supervision/cueing for compensatory strategies Compensations: Slow rate;Small sips/bites;Follow solids with liquid Postural Changes and/or Swallow Maneuvers: Seated upright 90 degrees    Other  Recommendations Oral Care Recommendations: Oral care BID   Follow Up Recommendations   (TBD)    Frequency and Duration min 2x/week  1 week   Pertinent Vitals/Pain n/a     General Date of Onset: 07/17/14 Reason for Referral: Objectively evaluate swallowing function Previous Swallow Assessment: 2/13 Diet Prior to this Study: Dysphagia 3 (soft);Honey-thick liquids Respiratory Status: Room air Behavior/Cognition: Alert Oral Cavity - Dentition: Adequate natural dentition Oral Motor / Sensory Function: Impaired - see Bedside swallow eval Baseline Vocal Quality: Low vocal intensity Volitional Cough: Strong Volitional  Swallow: Unable to elicit Anatomy: Within functional limits Pharyngeal Secretions: Not observed secondary MBS    Reason for Referral Objectively evaluate swallowing function   Oral Phase Oral Preparation/Oral Phase Oral Phase: Impaired Oral - Honey Oral - Honey Cup: Reduced posterior propulsion;Delayed oral transit;Lingual pumping Oral - Nectar Oral - Nectar Teaspoon: Lingual pumping;Reduced posterior propulsion;Delayed oral transit Oral - Nectar Cup: Lingual pumping;Reduced posterior propulsion;Delayed oral transit Oral - Solids Oral - Puree: Delayed oral transit;Reduced posterior propulsion;Lingual pumping Oral - Mechanical Soft: Weak lingual manipulation;Impaired mastication;Reduced posterior propulsion;Delayed oral transit;Lingual pumping;Lingual/palatal residue   Pharyngeal Phase Pharyngeal - Honey Pharyngeal - Honey Cup: Delayed swallow initiation;Premature spillage to valleculae;Pharyngeal residue - valleculae;Reduced tongue base retraction;Reduced laryngeal elevation;Pharyngeal residue - pyriform sinuses;Pharyngeal residue - cp segment Pharyngeal - Nectar Pharyngeal - Nectar Teaspoon: Delayed swallow initiation;Penetration/Aspiration during swallow;Pharyngeal residue - pyriform sinuses;Reduced tongue base retraction;Pharyngeal residue - valleculae;Reduced laryngeal elevation;Pharyngeal residue - cp segment Penetration/Aspiration details (nectar teaspoon): Material enters airway, remains ABOVE vocal cords and not ejected out Pharyngeal - Nectar Cup: Delayed swallow initiation;Reduced laryngeal elevation;Reduced tongue base retraction;Penetration/Aspiration during swallow;Pharyngeal residue - pyriform sinuses;Pharyngeal residue - cp segment;Pharyngeal residue - posterior pharnyx Penetration/Aspiration details (nectar cup): Material enters airway, passes BELOW cords without attempt by patient to eject out (silent aspiration);Material enters airway, remains ABOVE vocal cords and not ejected  out Pharyngeal - Solids Pharyngeal - Puree: Delayed swallow initiation;Premature spillage to valleculae;Pharyngeal residue - valleculae;Pharyngeal residue - cp segment;Pharyngeal residue - pyriform sinuses;Reduced tongue base retraction;Reduced laryngeal elevation Pharyngeal - Mechanical Soft: Delayed swallow initiation;Premature spillage to valleculae;Reduced laryngeal elevation;Reduced tongue base retraction;Pharyngeal residue - valleculae;Pharyngeal residue - posterior pharnyx;Pharyngeal residue - pyriform sinuses;Pharyngeal residue - cp segment  Cervical Esophageal Phase    GO    Cervical Esophageal Phase Cervical Esophageal Phase: Impaired Cervical Esophageal Phase - Comment Cervical Esophageal Comment: intermittently decreased movement of materials through the UES which is likely related to decreased hyolaryngeal excursion and increased esophageal pressure with intermittent backflow into the cervical  esophagus         Kailiana Granquist, Selinda Orion 07/20/2014, 4:14 PM

## 2014-07-20 NOTE — Progress Notes (Signed)
Rehab Admissions Coordinator Note:  Patient was screened by Retta Diones for appropriateness for an Inpatient Acute Rehab Consult.  At this time, we are recommending Inpatient Rehab consult.  Retta Diones 07/20/2014, 3:15 PM  I can be reached at [phone].

## 2014-07-20 NOTE — Plan of Care (Signed)
Adult (Non-Pregnant) Hypoglycemia Protocol Treatment Guidelines  1.  RN shall initiate Hypoglycemia Protocol emergency measures immediately when:            w Routine or STAT CBG and/or a lab glucose indicates hypoglycemia (CBG < 70 mg/dl)  2.  Treat the patient according to ability to take PO's and severity of hypoglycemia.   3.  If patient is on GlucoStabilizer, follow directions provided by the GlucoStabilizer for hypoglycemic events.  4.  If patient on insulin pump, follow Hypoglycemia Protocol.  If patient requires more than one treatment have patient place pump in SUSPEND and notify MD.  DO NOT leave pump in SUSPEND for greater than 30 minutes unless ordered by MD.  A.  Treatment for Mild or Moderate-Patient cooperative and able to swallow    1.  Patient taking PO's and can cooperate   a.  Give one of the following 15 gram CHO options:                           w 1 tube oral dextrose gel                           w 3-4 Glucose tablets                           w 4 oz. Juice                           w 4 oz. regular soda                                    ESRD patients:  clear, regular soda                           w 8 oz. skim milk    b.  Recheck CBG in 15 minutes after treatment                            w If CBG < 70 mg/dl, repeat treatment and recheck until hypoglycemia is resolved                            w If CBG > 70 mg/dl and next meal is more than 1 hour away, give additional 15 grams CHO   2.  Patient NPO-Patient cooperative and no altered mental status    a.  Give 25 ml of D50 IV.   b.  Recheck CBG in 15 minutes after treatment.                             w If CBG is less than 70 mg/dl, repeat treatment and recheck until hypoglycemia is resolved.   c.  Notify MD for further orders.             SPECIAL CONSIDERATIONS:    a.  If no IV access,                              w Start IV of D5W at KVO                               w Give 25 ml of D50 IV.    b.  If  unable to gain IV access                             w Give Glucagon IM:    i.  1 mg if patient weighs more than 45.5 kg    ii.  0.5 mg if patient weighs less than 45.5 kg   c.  Notify MD for further orders  B.  Treatment for Severe-- Patient unconscious or unable to take PO's safely    1.  Position patient on side   2.  Give 50 ml D50 IV   3.  Recheck CBG in 15 minutes.                    w If CBG is less than 70 mg/dl, repeat treatment and recheck until hypoglycemia is resolved.   4.  Notify MD for further orders.    SPECIAL CONSIDERATIONS:    a.  If no IV access                              w Give Glucagon IM                                        i.  1 mg if patient weighs more than 45.5 kg                                       ii.  0.5 mg if patient weighs less than 45.5 kg                              w Start IV of D5W at 50 ml/hr and give 50 ml D50 IV   b.  If no IV access and active seizure                               w Call Rapid Response   c.  If unable to gain IV access, give Glucagon IM:                              w 1 mg if patient weighs more than 45.5 kg                              w 0.5 mg if patient weighs less than 45.5 kg   d.  Notify MD for further orders.  C.  Complete smart text progress note to document intervention and follow-up CBG   1.  In [facility] patient chart, click on Notes (left side of screen)   2.  Create Progress Note   3.  Click on Insert Smart Text.  In the Match box type "hypo" and enter    4.  Double click on [facility] IP HYPOGLYCEMIC EVENT and enter data   5.  MD must be notified if patient is NPO or experienced severe hypoglycemia  

## 2014-07-20 NOTE — Progress Notes (Signed)
Hypoglycemic Event  CBG: 67  Treatment: amp dextrose  Symptoms: none  Follow-up CBG: Time:  3 West to follow up with repeat CBG  Possible Reasons for Event: unknown  Comments/MD notified:Dr. Ladon Applebaum, Wells Guiles  Remember to initiate Hypoglycemia Order Set & complete

## 2014-07-20 NOTE — Progress Notes (Signed)
CRITICAL VALUE ALERT  Critical value received: k 2.9 Date of notification:  07/20/14 Time of notification:  0600 Critical value read back: yes Nurse who received alert:  km Responding MD:  [email or web address] Time MD responded:  0600

## 2014-07-20 NOTE — Evaluation (Signed)
Occupational Therapy Evaluation Patient Details Name: Jordan Ward MRN: [id number] DOB: 1940/12/16 Today's Date: 07/20/2014    History of Present Illness Pt admitted to [facility] on 10/19 with slurred speech and abdominal pain.  Pt with sepsis due to UTI, hypotensive.  PMH:  MS, HTN, CAD, NSTEMI, c diff.   Clinical Impression   Prior to admission, pt was modified independent in self care and assisted with homemaking activities.  He ambulated with a RW.  Pt presents with generalized weakness, decreased activity tolerance and impaired balance interfering with ability to perform ADL and ADL transfers. Pt is requiring +2 assist for mobility. Pt is highly motivated to return home at his prior level of independence.  He has good support of his wife.  Pt will progress well with rehab.    Follow Up Recommendations  CIR;Supervision/Assistance - 24 hour (if CIR declines, pt wants [facility])   Equipment Recommendations       Recommendations for Other Services       Precautions / Restrictions Precautions Precautions: Fall Precaution Comments: suprapubic catheter, flexiseal      Mobility Bed Mobility Overal bed mobility: +2 for physical assistance;Needs Assistance Bed Mobility: Supine to Sit     Supine to sit: +2 for physical assistance;Mod assist        Transfers Overall transfer level: Needs assistance Equipment used: 2 person hand held assist Transfers: Stand Pivot Transfers;Sit to/from Stand Sit to Stand: +2 physical assistance;Mod assist Stand pivot transfers: +2 physical assistance;Min assist            Balance Overall balance assessment: Needs assistance Sitting-balance support: No upper extremity supported;Feet supported Sitting balance-Leahy Scale: Fair     Standing balance support: Bilateral upper extremity supported Standing balance-Leahy Scale: Poor                              ADL Overall ADL's : Needs assistance/impaired Eating/Feeding:  Set up;Sitting Eating/Feeding Details (indicate cue type and reason): drank thickened water Grooming: Minimal assistance;Sitting   Upper Body Bathing: Moderate assistance;Sitting   Lower Body Bathing: +2 for safety/equipment;Total assistance;Sit to/from stand   Upper Body Dressing : Minimal assistance;Sitting   Lower Body Dressing: +2 for physical assistance;Total assistance;Sit to/from stand   Toilet Transfer: +2 for physical assistance;Minimal assistance;Stand-pivot Toilet Transfer Details (indicate cue type and reason): bed to chair Toileting- Clothing Manipulation and Hygiene: +2 for physical assistance;Total assistance;Sit to/from stand Toileting - Clothing Manipulation Details (indicate cue type and reason): flexiseal leak       General ADL Comments: Pt with impaired sitting and standing balance, relies on UEs for balance.     Vision                     Perception     Praxis      Pertinent Vitals/Pain Pain Assessment: No/denies pain     Hand Dominance Left (pt reports using both hands equally)   Extremity/Trunk Assessment Upper Extremity Assessment Upper Extremity Assessment: Generalized weakness (full AROM)   Lower Extremity Assessment Lower Extremity Assessment: Defer to PT evaluation       Communication Communication Communication: No difficulties (slightly slurred)   Cognition Arousal/Alertness: Awake/alert Behavior During Therapy: WFL for tasks assessed/performed Overall Cognitive Status: History of cognitive impairments - at baseline       Memory: Decreased short-term memory             General Comments  Exercises       Shoulder Instructions      Home Living Family/patient expects to be discharged to:: Private residence Living Arrangements: Spouse/significant other Available Help at Discharge: Family;Available 24 hours/day Type of Home: House Home Access: Stairs to enter [employer] of Steps: 2 Entrance  Stairs-Rails: Right Home Layout: One level     Bathroom Shower/Tub: Teacher, early years/pre: Standard     Home Equipment: Grab bars - toilet;Walker - 2 wheels          Prior Functioning/Environment Level of Independence: Independent with assistive device(s)        Comments: helped with dishes, laundry, sponge bathed, dressed independently    OT Diagnosis: Generalized weakness;Cognitive deficits   OT Problem List: Decreased strength;Decreased activity tolerance;Impaired balance (sitting and/or standing);Decreased cognition;Decreased knowledge of use of DME or AE   OT Treatment/Interventions:      OT Goals(Current goals can be found in the care plan section) Acute Rehab OT Goals Patient Stated Goal: Return to PLOF. OT Goal Formulation: With patient/family Time For Goal Achievement: 08/03/14 Potential to Achieve Goals: Good ADL Goals Pt Will Perform Grooming: standing;with min assist Pt Will Perform Upper Body Dressing: with supervision;sitting Pt Will Perform Lower Body Dressing: with min assist;sit to/from stand Pt Will Transfer to Toilet: bedside commode;ambulating;with min assist Pt Will Perform Toileting - Clothing Manipulation and hygiene: with min assist;sit to/from stand Additional ADL Goal #1: Pt will perform bed mobility with min assist as a precursor to ADL.  OT Frequency:     Barriers to D/C:            Co-evaluation PT/OT/SLP Co-Evaluation/Treatment: Yes Reason for Co-Treatment: Complexity of the patient's impairments (multi-system involvement);For patient/therapist safety   OT goals addressed during session: Strengthening/ROM      End of Session Nurse Communication: Mobility status  Activity Tolerance: Patient limited by fatigue Patient left: in chair;with call bell/phone within reach;with family/visitor present   Time: 1200-1230 OT Time Calculation (min): 30 min Charges:  OT General Charges $OT Visit: 1 Procedure OT  Evaluation $Initial OT Evaluation Tier I: 1 Procedure OT Treatments $Therapeutic Activity: 8-22 mins G-Codes:    Malka So 07/20/2014, 1:57 PM [phone]

## 2014-07-20 NOTE — Progress Notes (Signed)
Report called to receiving nurse [phone]. Past medical history and present hospitalization reported to nurse. All questions answered. All belongings transported with patient via bed. Wife at bedside aware of transfer to [phone].

## 2014-07-20 NOTE — Progress Notes (Signed)
[facility] Progress Note Patient Name: Jordan Ward DOB: 02-21-1941 MRN: [id number]   Date of Service  07/20/2014  HPI/Events of Note  Low K , crt  Noted, has already receievd 2 runs  eICU Interventions  k supp 4 runs     Intervention Category Intermediate Interventions: Electrolyte abnormality - evaluation and management  Raylene Miyamoto. 07/20/2014, 4:29 PM

## 2014-07-20 NOTE — Consult Note (Signed)
Admit date: 07/17/2014 Referring Physician  Dr. Lamonte Sakai Primary Physician  None Primary Cardiologist  None Reason for Consultation  Elevated troponin  HPI: This is a 73 y.o. M brought to [facility] ED on 10/19 with slurred speech. He apparently had aa fall the day prior but refused transport to ED. On 10/19, had some slurring of speech and was complaining of abdominal pain in area of supra-pubic catheter. He was apparently diagnosed with UTI 3 weeks ago but has had trouble taking his meds (Cipro) because of size of pills. In ED, he was found to have UTI with severe sepsis and remained somewhat hypotensive after 3L IVF. He was subsequently transferred to [facility] for further treatment.  He has a history of HTN, dyslipidemia, OSA, pulmonary fibrosis and CAD.  He was hydrated with IVF and started on IV Heparin gtt when it was noted that his troponin was elevated.  He was also found to be in acute renal failure with AG metabolic acidosis.  Blood cultures were positive for gram negative rods in 2 bottles.  Abdominal CT showed moderate obstruction of the left kidney and ureter with bladder stones.  2D echo was done which showed moderate LV dysfunction EF 30-35% with mild hypokinesis of the inferior Brodrick which is new from echo 09/2011.  Troponin peaked at 4.93 in the setting of acute renal failure with creatinine 3.9 at peak (baseline 0.82 03/2012) and BNP >70K.  Cardiology is now asked to consult.        PMH:   Past Medical History  Diagnosis Date  . Arthritis   . MS (multiple sclerosis)   . Insomnia   . PVD (peripheral vascular disease)   . Carotid artery stenosis   . TIA (transient ischemic attack)   . Chronic indwelling Foley catheter 10/06/2011  . Pulmonary fibrosis 10/06/2011  . Dysphagia 10/07/2011  . Tremors of nervous system 10/08/2011  . Hypothyroidism 10/08/2011  . Pulmonary nodule 10/08/2011  . C. difficile colitis 09/2011  . Pneumonia   . Encephalopathy   . Urinary tract infection   . Tremors of  nervous system   . Hypokalemia   . Junctional rhythm   . Anemia   . Hypernatremia   . Sacral ulcer   . HTN (hypertension), malignant 10/06/2011  . Sleep apnea   . Neuromuscular disorder   . Back pain, chronic   . Kidney stones   . High grade dysplasia in colonic adenoma 09/2005  . Gait disorder   . OSA (obstructive sleep apnea)   . Paroxysmal atrial tachycardia   . Dyslipidemia   . Cerebrovascular disease   . CAD (coronary artery disease)   . Peripheral vascular disease   . Bilateral carotid bruits   . Colon polyps   . HA (headache)      PSH:   Past Surgical History  Procedure Laterality Date  . Inguinal hernia repair  1971    bilateral  . Back surgery  1976/1979  . Colonoscopy  11/2004    Dr. Sharol Roussel sessile polyp splenic flexure, 44mm sessile polyp desc colon, tubulovillous adenoma (bx not removed)  . Colonoscopy  01/2005    poor prep, polyp could not be found  . Colonoscopy  05/2005    with EMR, polypectomy Dr. Olegario Messier, bx showed high grade dysplasia, partially resected  . Colonoscopy  09/2005    Dr. Arsenio Loader, [place] ink tattooing, four villous colon polyp (3 had been missed on previous colonoscopies due to limitations of procedures  . Colon surgery  09/2005    Fleishman: four tubular adenomas, large adenomatous polyp with HIGH GRADE dysplasia  . Appendectomy  09/2005    at time of left hemicolectomy  . Colonoscopy  09/2006    normal TI, no polyps  . Colonoscopy  10/2007    Dr. Imogene Burn distal mammillations, benign bx, normal TI, random bx neg for microscopic colitis  . Cholecystectomy      Dr. Tamala Julian  . Suprapubic catheter insertion      Allergies:  Tetracyclines & related and Ciprofloxacin Prior to Admit Meds:   Prescriptions prior to admission  Medication Sig Dispense Refill  . baclofen (LIORESAL) 10 MG tablet Take 10 mg by mouth 3 (three) times daily as needed for muscle spasms.      . diazepam (VALIUM) 5 MG tablet Take 1 tablet (5 mg total) by mouth 2  (two) times daily.  60 tablet  3  . HYDROcodone-acetaminophen (NORCO) 7.5-325 MG per tablet Take 1 tablet by mouth 2 (two) times daily.      Marland Kitchen levothyroxine (SYNTHROID, LEVOTHROID) 50 MCG tablet Take 50 mcg by mouth daily.      . potassium chloride SA (K-DUR,KLOR-CON) 20 MEQ tablet Take 20 mEq by mouth daily.      . verapamil (CALAN) 40 MG tablet Take 40 mg by mouth Twice daily.       Fam HX:    Family History  Problem Relation Age of Onset  . Colon cancer Neg Hx   . Cirrhosis Brother     etoh  . Stroke Mother 38  . Coronary artery disease Father 40  . Heart attack Brother   . Multiple sclerosis     Social HX:    History   Social History  . Marital Status: Married    Spouse Name: Pricilla Holm    Number of Children: 2  . Years of Education: 10   Occupational History  . disability   .     Social History Main Topics  . Smoking status: Former Smoker    Types: Cigarettes  . Smokeless tobacco: Never Used     Comment: quit remote  . Alcohol Use: No  . Drug Use: No  . Sexual Activity: Not on file   Other Topics Concern  . Not on file   Social History Narrative   Patient is married Pricilla Holm) and  Lives w/ wife.   Patient is retired.   Patient has a 10th grade education.   Patient has two children.           ROS:  All 11 ROS were addressed and are negative except what is stated in the HPI  Physical Exam: Blood pressure 153/86, pulse 29, temperature 98.1 F (36.7 C), temperature source Oral, resp. rate 29, height 5\' 6"  (1.676 m), weight 135 lb 12.9 oz (61.6 kg), SpO2 100.00%.    General: Well developed, well nourished, in no acute distress Head: Eyes PERRLA, No xanthomas.   Normal cephalic and atramatic  Lungs:   Clear bilaterally to auscultation and percussion. Heart:   HRRR S1 S2 Pulses are 2+ & equal.            No carotid bruit. No JVD.  No abdominal bruits. No femoral bruits. Abdomen: Bowel sounds are positive, abdomen soft and non-tender without masses Extremities:    No clubbing, cyanosis or edema.  DP +1 Neuro: Alert and oriented X 3. Psych:  Good affect, responds appropriately    Labs:   Lab Results  Component Value  Date   WBC 22.4* 07/20/2014   HGB 10.0* 07/20/2014   HCT 29.3* 07/20/2014   MCV 86.4 07/20/2014   PLT 57* 07/20/2014    Recent Labs Lab 07/20/14 0501  NA 146  K 2.9*  CL 113*  CO2 19  BUN 53*  CREATININE 3.10*  CALCIUM 7.8*  PROT 5.8*  BILITOT 0.4  ALKPHOS 89  ALT 52  AST 54*  GLUCOSE 115*   No results found for this basename: PTT   Lab Results  Component Value Date   INR 1.68* 07/18/2014   INR 1.96* 07/18/2014   INR 1.61* 07/17/2014   Lab Results  Component Value Date   CKTOTAL 3719* 07/18/2014   CKMB 8.2* 10/03/2011   TROPONINI 3.41* 07/20/2014     No results found for this basename: CHOL   No results found for this basename: HDL   No results found for this basename: LDLCALC   No results found for this basename: TRIG   No results found for this basename: CHOLHDL   No results found for this basename: LDLDIRECT      Radiology:  Dg Chest Port 1 View  07/18/2014   CLINICAL DATA:  Central line placement.  EXAM: PORTABLE CHEST - 1 VIEW  COMPARISON:  07/18/2014  FINDINGS: The right IJ central venous catheter tip is in the distal SVC approximately 2 cm above the cavoatrial junction. No complicating features. The heart and lungs are stable. No pneumothorax.  IMPRESSION: Right IJ central venous catheter tip is in the distal SVC. No complicating features.   Electronically Signed   By: Kalman Jewels M.D.   On: 07/18/2014 12:04    EKG:  NSR with ST/T wave abnormality consistent with anterior ischemia, prolonged QT  ASSESSMENT:  1.  Elevated troponin most likely secondary to demand ischemia in the setting of prolonged hypotension, sepsis and acte renal failure.  He does have reduced LVF which is new since last echo in 2013.  His records carry a diagnosis of CAD but in reviewing all of his records he dose not  seem to have been given that diagnosis until this pas year.  He denies any history of chest pain or SOB and no history of MI or cath.  Troponin is higher than would be expected with demand ischemia but he had significant hypotension in setting of severe renal failure which most likely accounts for higher troponin.   2.  Acute systolic CHF with elevated BNP 3.  Moderate to severe LV dysfunction EF 30-35% by recent echo which is new 4.  ASCAD Prolonged QTc in setting of hypokalemia 3.  AKI secondary to hypotension and dehydration in setting of sepsis with chronic nephrolithiasis and left hydronephrosis most likely from passed kidney stone.   4.  Transaminitis secondary to hypotension and sepsis 5.  Chronic Anemia 6.  Gram negative rod bacteremia with sepsis   PLAN:   1.  Avoid QT prolonging drugs 2.  Keep K+>4 3.  Ultimately needs further ischemic workup but need to wait until acute illness and renal failure resolve.   4.  Change Metoprolol to Coreg 6.25mg  BID 5.  Add Hydralazine 10mg  TID for afterload reduction and titrated as BP tolerates. Can stop amlodipine if BP gets too low in order to maximize afterload reduction with hydralazine.  Cannot use ACE I or ARB due to renal failure 6.  Add Imdur 30mg  daily for preload reduction.  Will follow with you  Sueanne Margarita, MD  07/20/2014  10:49 AM

## 2014-07-20 NOTE — Progress Notes (Signed)
PULMONARY / CRITICAL CARE MEDICINE   Name: Jordan Ward MRN: [id number] DOB: 1941/02/22    ADMISSION DATE:  07/17/2014 CONSULTATION DATE:  07/20/2014  REFERRING MD :  EDP  CHIEF COMPLAINT:  Septic Shock  INITIAL PRESENTATION: 73 y.o. M brought to [facility] ED on 10/19 with slurred speech.  Had a fall the day prior but refused transport to ED.  On 10/19, had some slurring of speech and was complaining of abdominal pain in area of supra-pubic catheter.  He was apparently diagnosed with UTI 3 weeks ago but has had trouble taking his meds (Cipro) because of size of pills.  In ED, he was found to have UTI with severe sepsis and remained somewhat hypotensive after 3L IVF.  Request was made for transfer to [facility] ICU.    STUDIES:  10/20 KUB:  Non obstructed bowel gas pattern. Chronic nephrolithiasis and vascular calcified atherosclerosis. 10/20 CT chest/abd/pelvis: Moderate obstruction of the left kidney and ureter. Bladder stones are present, probably representing recently passed stones. No definite distal ureteral stones otherwise identified. Bilateral intrarenal stones. Stones in the left renal pelvis. Foley catheter in the bladder. Small amount of free fluid in the pelvis. 10/20 TTE: LVEF 30-35%. Mild LVH. Mild hypokinesis of the inferior myocardium. All new findings since 10/06/11   SIGNIFICANT EVENTS: 10/19 presented to [facility] ED, transferred to [facility] with severe sepsis. 10/19 Neuro consult: Slurred speech and increased weakness may be due to acute sepsis and associated renal failure. There is no clear evidence of acute exacerbation of MS at this point. 10/20 normotensive, episodic hypoglycemia 10/21 Off vasopressors. Speech remains slurred 10/21 SLP eval: D III with nectar thick 10/22 Antibiotics changed for sensitives. Troponin remains elevated. EKG shows T wave abnormalities.   SUBJECTIVE:   Elevated BPs requiring pharmacologic control  VITAL SIGNS: Temp:  [97.7 F (36.5 C)-98.4 F (36.9 C)] 98.2 F  (36.8 C) (10/22 0353) Pulse Rate:  [70-98] 75 (10/22 0700) Resp:  [18-35] 29 (10/22 0700) BP: (129-167)/(71-117) 153/77 mmHg (10/22 0700) SpO2:  [92 %-100 %] 99 % (10/22 0700) Weight:  [135 lb 12.9 oz (61.6 kg)] 135 lb 12.9 oz (61.6 kg) (10/22 0500) HEMODYNAMICS:   VENTILATOR SETTINGS:   INTAKE / OUTPUT: Intake/Output     10/21 0701 - 10/22 0700 10/22 0701 - 10/23 0700   I.V. (mL/kg) 1920 (31.2)    IV Piggyback 100    Total Intake(mL/kg) 2020 (32.8)    Urine (mL/kg/hr) 2885 (2)    Stool     Total Output 2885     Net -865          Urine Occurrence 1 x      PHYSICAL EXAMINATION: General: NAD  Neuro: diffusely weak, no focal deficits, slurred speech HEENT: WNL Cardiovascular: RRR s M Lungs: unlabored, clear.  Abdomen: soft, NT +BS  Ext: no edema, warm  LABS:  Labs reviewed. Relevant abnormalities are discussed in the A/P section  CXR: NNF  ASSESSMENT / PLAN:  PULMONARY A: Hx OSA Hx Pulmonary fibrosis P:   Supplemental O2 as needed Outpt sleep study   CARDIOVASCULAR A:  Septic shock, resolved Hx HTN  Hx HLD Hx CAD HTN NSTEMI - likely demand ischemia (elevated trops and sig changed EKG with twave depression in v1-3) P:  MAP goal > 60 mmHg Cont ASA Cont telemetry BB added 10/22; unclear why pt on verapamil alone at home? CCB started on 10/22 Cards consult  Hold on heparin given thrombocytopenia  RENAL A:   AKI, nonoliguric -  baseline Cr approx 1 Chronic suprapubic catheter Anion gap acidosis Resolving lactic acidosis Hypokalemia Hypernatremia, mild Chronic Nephrolitiasis L hydronephrosis Atrophic R kidney P:   Urology following Monitor BMET intermittently Monitor I/Os Correct electrolytes as indicated DC fluids, as patient is eating  GASTROINTESTINAL A:   Elevated transaminases - improving Antibiotic associated Diarrhea- not C Diff Dysphagia due to MS, acute illness P:   SUP: N/I Monitor LFTs intermittently D III with nectar thick  - SLP following Cont probiotics  HEMATOLOGIC A:   Chronic anemia Thrombocytopenia - unclear etiology. Suspect low grade DIC P:  DVT px: SCDs - avoid heparins until HIT panel back Monitor CBC intermittently Transfuse per usual ICU guidelines  F/U HIT panel results when available  INFECTIOUS A:   Severe Sepsis due to urinary tract source Gram negative bacteremia Diarrhea, C diff negative P:   BCx2 10/19- 2/2 GNR >> pseudomonas UCx 10/19 >>  C Diff 10/19- negative  Metronidazole 10/19 >> 10/20 PO vanc 10/19 >> 10/20 Ceftriaxone 10/19 >> 10/20 Ceftaz 10/20 >> 10/22 Imipenem 10/22>>  PCT protocol ordered 10/19  ENDOCRINE A:   Hypothyroidism   Episodic hypoglycemia Normal adrenal stress response (cortisol 43 10/19) P:   Cont levothyroxine DC dextrose IV fluids since eating Monitor CBGs q 8 hrs and PRN No SSI  NEUROLOGIC A:   Multiple sclerosis - able to perform ADLs @ baseline Chronic back pain Slurred speech - likely due to exacerbation of MS symptoms in setting of shock P:   Neuro following MRI when stabilizes from acute critical illness  DERM A:  Left buttock stage 1 PU Sacrum stage 1 PU Righ buttock stage 3 PU Right ankle stage 3 PU  P Cont recs per WOC  FAMILY No family at bedside this morning.   Todays summary:  Blood cultures positive for pseudomonas resistant to ceftazidime, antibiotics changed to imipenem for sensitivities. EKG shows new T wave abnormalities and troponin remains elevated, which will therefore necessitate a cards consult. Heparin remains off for thrombocytopenia. Patient will be transferred to telemetry. PCCM to see one more day, then decide on trf to [facility] service  Merton Border, MD ; [facility] [phone].  After 5:30 PM or weekends, call [phone]

## 2014-07-20 NOTE — Progress Notes (Signed)
Potassium level 2.8. Called to Dr. Titus Mould. See chart for new orders

## 2014-07-20 NOTE — Progress Notes (Signed)
INITIAL NUTRITION ASSESSMENT  DOCUMENTATION CODES Per approved criteria  -Not Applicable   INTERVENTION:  Ensure Pudding po TID, each supplement provides 170 kcal and 4 grams of protein  NUTRITION DIAGNOSIS: Increased nutrient needs related to multiple wounds as evidenced by estimated calorie and protein needs.   Goal: Intake to meet >90% of estimated nutrition needs.  Monitor:  Swallowing function, PO intake, labs, weight trend.  Reason for Assessment: Low Braden  73 y.o. male  Admitting Dx: Septic Shock  ASSESSMENT: 73 y.o. M brought to [facility] ED on 10/19 with slurred speech. Had a fall the day prior but refused transport to ED. On 10/19, had some slurring of speech and was complaining of abdominal pain in area of supra-pubic catheter. He was apparently diagnosed with UTI 3 weeks ago but has had trouble taking his meds (Cipro) because of size of pills. In ED, he was found to have UTI with severe sepsis and remained somewhat hypotensive after 3L IVF. Transferred to [facility] ICU on 10/19.  Unable to complete nutrition focused physical exam at this time. Patient going for a MBS with SLP today to evaluate swallowing function. Currently on a dysphagia 3 diet with honey thick liquids. No weight loss noted in the past few years.   Height: Ht Readings from Last 1 Encounters:  07/17/14 5\' 6"  (1.676 m)    Weight: Wt Readings from Last 1 Encounters:  07/20/14 135 lb 12.9 oz (61.6 kg)    Ideal Body Weight: 64.5 kg  % Ideal Body Weight: 96%  Wt Readings from Last 10 Encounters:  07/20/14 135 lb 12.9 oz (61.6 kg)  03/13/14 133 lb (60.328 kg)  01/26/13 134 lb (60.782 kg)  11/22/12 134 lb 9.6 oz (61.054 kg)  05/15/12 126 lb (57.153 kg)  05/13/12 130 lb (58.968 kg)  04/20/12 137 lb (62.143 kg)  01/03/12 137 lb (62.143 kg)  10/11/11 138 lb (62.596 kg)  10/09/11 143 lb 12.8 oz (65.227 kg)    Usual Body Weight: 133 lb  % Usual Body Weight: 102%  BMI:  Body mass index is 21.93  kg/(m^2).  Estimated Nutritional Needs: Kcal: 1800-2000 Protein: 95-110 gm Fluid: 1.8-2 L  Skin: stage 3 pressure ulcer to ankle, stage 1 pressure ulcer to left buttocks, stage 3 pressure ulcer to right buttocks  Diet Order: Dysphagia 3 with honey thick liquids.  EDUCATION NEEDS: -Education not appropriate at this time   Intake/Output Summary (Last 24 hours) at 07/20/14 1027 Last data filed at 07/20/14 0816  Gross per 24 hour  Intake   1755 ml  Output   2910 ml  Net  -1155 ml    Last BM: 10/21   Labs:   Recent Labs Lab 07/18/14 0119 07/18/14 0739 07/20/14 0501  NA 149* 146 146  K 3.6* 3.7 2.9*  CL 114* 114* 113*  CO2 16* 15* 19  BUN 50* 55* 53*  CREATININE 3.91* 3.88* 3.10*  CALCIUM 7.4* 7.0* 7.8*  MG  --  1.5  --   PHOS  --  3.1  --   GLUCOSE 119* 145* 115*    CBG (last 3)   Recent Labs  07/19/14 1510 07/19/14 2357 07/20/14 0829  GLUCAP 112* 134* 84    Scheduled Meds: . amLODipine  5 mg Oral Daily  . antiseptic oral rinse  7 mL Mouth Rinse BID  . aspirin  81 mg Oral Daily  . Chlorhexidine Gluconate Cloth  6 each Topical Q0600  . collagenase   Topical Daily  . imipenem-cilastatin  250 mg Intravenous Q8H  . levothyroxine  50 mcg Oral QAC breakfast  . metoprolol tartrate  25 mg Oral BID  . mupirocin ointment  1 application Nasal BID  . saccharomyces boulardii  250 mg Oral BID    Continuous Infusions:   Past Medical History  Diagnosis Date  . Arthritis   . MS (multiple sclerosis)   . Insomnia   . PVD (peripheral vascular disease)   . Carotid artery stenosis   . TIA (transient ischemic attack)   . Chronic indwelling Foley catheter 10/06/2011  . Pulmonary fibrosis 10/06/2011  . Dysphagia 10/07/2011  . Tremors of nervous system 10/08/2011  . Hypothyroidism 10/08/2011  . Pulmonary nodule 10/08/2011  . C. difficile colitis 09/2011  . Pneumonia   . Encephalopathy   . Urinary tract infection   . Tremors of nervous system   . Hypokalemia   .  Junctional rhythm   . Anemia   . Hypernatremia   . Sacral ulcer   . HTN (hypertension), malignant 10/06/2011  . Sleep apnea   . Neuromuscular disorder   . Back pain, chronic   . Kidney stones   . High grade dysplasia in colonic adenoma 09/2005  . Gait disorder   . OSA (obstructive sleep apnea)   . Paroxysmal atrial tachycardia   . Dyslipidemia   . Cerebrovascular disease   . CAD (coronary artery disease)   . Peripheral vascular disease   . Bilateral carotid bruits   . Colon polyps   . HA (headache)     Past Surgical History  Procedure Laterality Date  . Inguinal hernia repair  1971    bilateral  . Back surgery  1976/1979  . Colonoscopy  11/2004    Dr. Sharol Roussel sessile polyp splenic flexure, 53mm sessile polyp desc colon, tubulovillous adenoma (bx not removed)  . Colonoscopy  01/2005    poor prep, polyp could not be found  . Colonoscopy  05/2005    with EMR, polypectomy Dr. Olegario Messier, bx showed high grade dysplasia, partially resected  . Colonoscopy  09/2005    Dr. Arsenio Loader, [place] ink tattooing, four villous colon polyp (3 had been missed on previous colonoscopies due to limitations of procedures  . Colon surgery  09/2005    Fleishman: four tubular adenomas, large adenomatous polyp with HIGH GRADE dysplasia  . Appendectomy  09/2005    at time of left hemicolectomy  . Colonoscopy  09/2006    normal TI, no polyps  . Colonoscopy  10/2007    Dr. Imogene Burn distal mammillations, benign bx, normal TI, random bx neg for microscopic colitis  . Cholecystectomy      Dr. Tamala Julian  . Suprapubic catheter insertion      Molli Barrows, RD, LDN, [place] Pager [phone] After Hours Pager [phone]

## 2014-07-20 NOTE — Procedures (Deleted)
Objective Swallowing Evaluation: Modified barium swallow evaluation  Patient Details  Name: Jordan Ward MRN: [id number] Date of Birth: 07/09/1941  Today's Date: 07/20/2014 Time: 1030-1100 SLP Time Calculation (min): 30 min  Past Medical History:  Past Medical History  Diagnosis Date  . Arthritis   . MS (multiple sclerosis)   . Insomnia   . PVD (peripheral vascular disease)   . Carotid artery stenosis   . TIA (transient ischemic attack)   . Chronic indwelling Foley catheter 10/06/2011  . Pulmonary fibrosis 10/06/2011  . Dysphagia 10/07/2011  . Tremors of nervous system 10/08/2011  . Hypothyroidism 10/08/2011  . Pulmonary nodule 10/08/2011  . C. difficile colitis 09/2011  . Pneumonia   . Encephalopathy   . Urinary tract infection   . Tremors of nervous system   . Hypokalemia   . Junctional rhythm   . Anemia   . Hypernatremia   . Sacral ulcer   . HTN (hypertension), malignant 10/06/2011  . Sleep apnea   . Neuromuscular disorder   . Back pain, chronic   . Kidney stones   . High grade dysplasia in colonic adenoma 09/2005  . Gait disorder   . OSA (obstructive sleep apnea)   . Paroxysmal atrial tachycardia   . Dyslipidemia   . Cerebrovascular disease   . CAD (coronary artery disease)   . Peripheral vascular disease   . Bilateral carotid bruits   . Colon polyps   . HA (headache)    Past Surgical History:  Past Surgical History  Procedure Laterality Date  . Inguinal hernia repair  1971    bilateral  . Back surgery  1976/1979  . Colonoscopy  11/2004    Dr. Sharol Roussel sessile polyp splenic flexure, 11mm sessile polyp desc colon, tubulovillous adenoma (bx not removed)  . Colonoscopy  01/2005    poor prep, polyp could not be found  . Colonoscopy  05/2005    with EMR, polypectomy Dr. Olegario Messier, bx showed high grade dysplasia, partially resected  . Colonoscopy  09/2005    Dr. Arsenio Loader, [place] ink tattooing, four villous colon polyp (3 had been missed on previous colonoscopies due to  limitations of procedures  . Colon surgery  09/2005    Fleishman: four tubular adenomas, large adenomatous polyp with HIGH GRADE dysplasia  . Appendectomy  09/2005    at time of left hemicolectomy  . Colonoscopy  09/2006    normal TI, no polyps  . Colonoscopy  10/2007    Dr. Imogene Burn distal mammillations, benign bx, normal TI, random bx neg for microscopic colitis  . Cholecystectomy      Dr. Tamala Julian  . Suprapubic catheter insertion     HPI:   73 y.o. male with hx of long-standing MS brought to [facility] ED on 10/19 with slurred speech. Dx of severe sepsis. Pt has a hx of variable neurogenic dysphagia. Last MBS studies were at [facility] in Jan and Feb of 2013, the latter of which recommended nectar-liquids/modified diet secondary to aspiration. Per report, dysphagia resolved and pt has been eating a regular diet with thin liquids.  Pt initiated on Dys 3 solids and honey thick liquids on 10/21 due to overt s/s of aspiration noted at bedside.  MBS ordered 10/22 to objectively determine safest diet consistencies.       Assessment / Plan / Recommendation Clinical Impression  Dysphagia Diagnosis: Moderate pharyngeal phase dysphagia;Mild oral phase dysphagia;Mild cervical esophageal phase dysphagia Pt presents with a moderate multifactorial dysphagia with both sensory and motor impairments.  Pt  presents with generalized oral motor weakness which resulted in oral residue post swallow and prolonged transit of materials into the oropharynx.  Pt exhibited a delayed swallow response due to decreased pharyngeal sensation and overall poor control of boluses which resulted in silent aspiration of large cup sips of nectar thick liquids.  Attempts to better control boluses with small cup sips or teaspoons of nectar thick liquids slightly improved airway protection but still resulted in silent penetration.  Additional pharyngeal phase impairments were characterized by decreased base of tongue retraction, poor tongue to palate  contact, and decreased laryngeal elevation which resulted in inconsistent amounts of pharyngeal residue across all consistencies.  SLP also suspects some component of esophageal dysphagia impacting pharyngeal residue due to pooling of materials at the UES and intermittent backflow of materials into the cervical esophagus.  The abovementioned functional impairments appeared to resolve with increased time between bites/sips Therefore, recommend pt continue on Dys 3 diet with honey thick liquids, meds crushed in puree, full supervision for use of the following swallowing precautions: slow rate, small bites/sips, and alternate solids and liquids.  Prognosis for advancement good with trials of advanced consistencies and pharyngeal strengthening exercises.  Recommend repeat MBS to objectively determine readiness for diet advancement.     Treatment Recommendation  Therapy as outlined in treatment plan below    Diet Recommendation Honey-thick liquid;Dysphagia 3 (Mechanical Soft)   Liquid Administration via: Cup Medication Administration: Crushed with puree Supervision: Patient able to self feed;Full supervision/cueing for compensatory strategies Compensations: Slow rate;Small sips/bites;Follow solids with liquid Postural Changes and/or Swallow Maneuvers: Seated upright 90 degrees    Other  Recommendations Oral Care Recommendations: Oral care BID   Follow Up Recommendations   (TBD)    Frequency and Duration min 2x/week  2 weeks   Pertinent Vitals/Pain n/a     General Date of Onset: 07/17/14 Type of Study: Bedside swallow evaluation Reason for Referral: Objectively evaluate swallowing function Previous Swallow Assessment: 2/13 Diet Prior to this Study: Dysphagia 3 (soft);Honey-thick liquids Temperature Spikes Noted: No Respiratory Status: Room air Behavior/Cognition: Alert Oral Cavity - Dentition: Adequate natural dentition Oral Motor / Sensory Function: Impaired - see Bedside swallow  eval Self-Feeding Abilities: Able to feed self Patient Positioning: Upright in chair Baseline Vocal Quality: Low vocal intensity Volitional Cough: Strong Volitional Swallow: Unable to elicit Anatomy: Within functional limits Pharyngeal Secretions: Not observed secondary MBS    Reason for Referral Objectively evaluate swallowing function   Oral Phase Oral Preparation/Oral Phase Oral Phase: Impaired Oral - Honey Oral - Honey Cup: Reduced posterior propulsion;Delayed oral transit;Lingual pumping Oral - Nectar Oral - Nectar Teaspoon: Lingual pumping;Reduced posterior propulsion;Delayed oral transit Oral - Nectar Cup: Lingual pumping;Reduced posterior propulsion;Delayed oral transit Oral - Solids Oral - Puree: Delayed oral transit;Reduced posterior propulsion;Lingual pumping Oral - Mechanical Soft: Impaired mastication;Weak lingual manipulation;Reduced posterior propulsion;Delayed oral transit;Lingual pumping;Lingual/palatal residue   Pharyngeal Phase Pharyngeal Phase Pharyngeal Phase: Impaired Pharyngeal - Honey Pharyngeal - Honey Cup: Delayed swallow initiation;Premature spillage to valleculae;Pharyngeal residue - valleculae;Reduced tongue base retraction;Reduced laryngeal elevation;Pharyngeal residue - pyriform sinuses;Pharyngeal residue - cp segment Pharyngeal - Nectar Pharyngeal - Nectar Teaspoon: Delayed swallow initiation;Penetration/Aspiration during swallow;Pharyngeal residue - pyriform sinuses;Reduced tongue base retraction;Pharyngeal residue - valleculae;Reduced laryngeal elevation;Pharyngeal residue - cp segment Penetration/Aspiration details (nectar teaspoon): Material enters airway, remains ABOVE vocal cords and not ejected out Pharyngeal - Nectar Cup: Delayed swallow initiation;Reduced laryngeal elevation;Reduced tongue base retraction;Penetration/Aspiration during swallow;Pharyngeal residue - pyriform sinuses;Pharyngeal residue - cp segment;Pharyngeal residue - posterior  pharnyx Penetration/Aspiration  details (nectar cup): Material enters airway, passes BELOW cords without attempt by patient to eject out (silent aspiration);Material enters airway, remains ABOVE vocal cords and not ejected out Pharyngeal - Solids Pharyngeal - Puree: Delayed swallow initiation;Premature spillage to valleculae;Pharyngeal residue - valleculae;Pharyngeal residue - cp segment;Pharyngeal residue - pyriform sinuses;Reduced tongue base retraction;Reduced laryngeal elevation Pharyngeal - Mechanical Soft: Delayed swallow initiation;Premature spillage to valleculae;Reduced laryngeal elevation;Reduced tongue base retraction;Pharyngeal residue - valleculae;Pharyngeal residue - posterior pharnyx;Pharyngeal residue - pyriform sinuses;Pharyngeal residue - cp segment  Cervical Esophageal Phase    GO    Cervical Esophageal Phase Cervical Esophageal Phase: Impaired Cervical Esophageal Phase - Comment Cervical Esophageal Comment: intermittently decreased movement of materials through the UES which is likely related to decreased hyolaryngeal excursion and increased esophageal pressure with intermittent backflow into the cervical  esophagus         Layla Gramm, Selinda Orion 07/20/2014, 1:31 PM

## 2014-07-21 DIAGNOSIS — I420 Dilated cardiomyopathy: Secondary | ICD-10-CM | POA: Diagnosis present

## 2014-07-21 DIAGNOSIS — N289 Disorder of kidney and ureter, unspecified: Secondary | ICD-10-CM

## 2014-07-21 DIAGNOSIS — I359 Nonrheumatic aortic valve disorder, unspecified: Secondary | ICD-10-CM

## 2014-07-21 LAB — CBC WITH DIFFERENTIAL/PLATELET
Basophils Absolute: 0.2 10*3/uL — ABNORMAL HIGH (ref 0.0–0.1)
Basophils Relative: 1 % (ref 0–1)
EOS ABS: 0.2 10*3/uL (ref 0.0–0.7)
EOS PCT: 1 % (ref 0–5)
HCT: 34.4 % — ABNORMAL LOW (ref 39.0–52.0)
Hemoglobin: 11.5 g/dL — ABNORMAL LOW (ref 13.0–17.0)
LYMPHS ABS: 2.6 10*3/uL (ref 0.7–4.0)
Lymphocytes Relative: 14 % (ref 12–46)
MCH: 29 pg (ref 26.0–34.0)
MCHC: 33.4 g/dL (ref 30.0–36.0)
MCV: 86.6 fL (ref 78.0–100.0)
Monocytes Absolute: 1.5 10*3/uL — ABNORMAL HIGH (ref 0.1–1.0)
Monocytes Relative: 8 % (ref 3–12)
Neutro Abs: 13.9 10*3/uL — ABNORMAL HIGH (ref 1.7–7.7)
Neutrophils Relative %: 76 % (ref 43–77)
PLATELETS: 59 10*3/uL — AB (ref 150–400)
RBC: 3.97 MIL/uL — AB (ref 4.22–5.81)
RDW: 15 % (ref 11.5–15.5)
WBC: 18.4 10*3/uL — ABNORMAL HIGH (ref 4.0–10.5)

## 2014-07-21 LAB — CULTURE, BLOOD (ROUTINE X 2)

## 2014-07-21 LAB — BASIC METABOLIC PANEL
Anion gap: 14 (ref 5–15)
BUN: 51 mg/dL — ABNORMAL HIGH (ref 6–23)
CALCIUM: 8 mg/dL — AB (ref 8.4–10.5)
CHLORIDE: 115 meq/L — AB (ref 96–112)
CO2: 16 meq/L — AB (ref 19–32)
CREATININE: 2.5 mg/dL — AB (ref 0.50–1.35)
GFR calc Af Amer: 28 mL/min — ABNORMAL LOW (ref >90)
GFR calc non Af Amer: 24 mL/min — ABNORMAL LOW (ref >90)
Glucose, Bld: 98 mg/dL (ref 70–99)
Potassium: 3.5 mEq/L — ABNORMAL LOW (ref 3.7–5.3)
Sodium: 145 mEq/L (ref 137–147)

## 2014-07-21 MED ORDER — POTASSIUM CHLORIDE CRYS ER 20 MEQ PO TBCR
40.0000 meq | EXTENDED_RELEASE_TABLET | Freq: Once | ORAL | Status: AC
  Administered 2014-07-21: 40 meq via ORAL
  Filled 2014-07-21: qty 2

## 2014-07-21 MED ORDER — ISOSORBIDE MONONITRATE ER 30 MG PO TB24
30.0000 mg | ORAL_TABLET | Freq: Every day | ORAL | Status: DC
  Administered 2014-07-21 – 2014-07-26 (×6): 30 mg via ORAL
  Filled 2014-07-21 (×6): qty 1

## 2014-07-21 MED ORDER — CARVEDILOL 6.25 MG PO TABS
6.2500 mg | ORAL_TABLET | Freq: Two times a day (BID) | ORAL | Status: DC
  Administered 2014-07-21 – 2014-07-26 (×9): 6.25 mg via ORAL
  Filled 2014-07-21 (×14): qty 1

## 2014-07-21 MED ORDER — HYDRALAZINE HCL 10 MG PO TABS
10.0000 mg | ORAL_TABLET | Freq: Three times a day (TID) | ORAL | Status: DC
  Administered 2014-07-21 – 2014-07-26 (×14): 10 mg via ORAL
  Filled 2014-07-21 (×19): qty 1

## 2014-07-21 NOTE — Progress Notes (Signed)
PULMONARY / CRITICAL CARE MEDICINE   Name: Jordan Ward MRN: [id number] DOB: [birth year]    ADMISSION DATE:  07/17/2014 CONSULTATION DATE:  07/21/2014  REFERRING MD :  EDP  CHIEF COMPLAINT:  Septic Shock  INITIAL PRESENTATION: 73 y.o. M brought to [facility] ED on 10/19 with slurred speech.  Had a fall the day prior but refused transport to ED.  On 10/19, had some slurring of speech and was complaining of abdominal pain in area of supra-pubic catheter.  He was apparently diagnosed with UTI 3 weeks ago but has had trouble taking his meds (Cipro) because of size of pills.  In ED, he was found to have UTI with severe sepsis and remained somewhat hypotensive after 3L IVF.  Request was made for transfer to [facility] ICU.    STUDIES:  10/20  KUB >>  Non obstructed bowel gas pattern. Chronic nephrolithiasis and vascular calcified atherosclerosis. 10/20  CT chest/abd/pelvis >> Moderate obstruction of the left kidney and ureter. Bladder stones are present, probably representing recently passed stones. No definite distal ureteral stones otherwise identified. Bilateral intrarenal stones. Stones in the left renal pelvis. Foley catheter in the bladder. Small amount of free fluid in the pelvis. 10/20  TTE >> LVEF 30-35%. Mild LVH. Mild hypokinesis of the inferior myocardium. All new findings since 10/06/11   SIGNIFICANT EVENTS: 10/19  presented to [facility] ED, transferred to [facility] with severe sepsis. 10/19  Neuro consult: Slurred speech & increased weakness may be due to acute sepsis & associated renal failure. No clear evidence of acute exacerbation of MS at this point. 10/20  normotensive, episodic hypoglycemia 10/21  Off vasopressors. Speech remains slurred 10/21  SLP eval > D III with nectar thick 10/22  Antibiotics changed for sensitives. Troponin remains elevated. EKG shows T wave abnormalities.   SUBJECTIVE:  Pt denies acute c/o's.  Eating modified diet without difficulties.    VITAL SIGNS: Temp:  [97.6 F (36.4  C)-98.5 F (36.9 C)] 98.3 F (36.8 C) (10/23 0750) Pulse Rate:  [29-76] 67 (10/23 0750) Resp:  [19-29] 20 (10/23 0750) BP: (144-161)/(71-89) 155/76 mmHg (10/23 0750) SpO2:  [96 %-100 %] 100 % (10/23 0750)  INTAKE / OUTPUT: Intake/Output     10/22 0701 - 10/23 0700 10/23 0701 - 10/24 0700   I.V. (mL/kg) 275 (4.5)    Other 30    IV Piggyback 250    Total Intake(mL/kg) 555 (9)    Urine (mL/kg/hr) 2200 (1.5)    Total Output 2200     Net -1645            PHYSICAL EXAMINATION: General: chronically ill M, in NAD  Neuro: diffusely weak, no focal deficits, speech improved HEENT: WNL Cardiovascular: RRR s M Lungs: unlabored, clear.  Abdomen: soft, NT +BS  Ext: trace LE edema, warm  LABS:  Labs reviewed. Relevant abnormalities are discussed in the A/P section  CXR: NNF  ASSESSMENT / PLAN:  PULMONARY A: Hx OSA Hx Pulmonary Fibrosis P:   Supplemental O2 as needed Outpt sleep study recommended Pulmonary hygiene, mobilize  CARDIOVASCULAR A:  Septic shock, resolved Hx HTN  Hx HLD Hx CAD HTN NSTEMI - likely demand ischemia (elevated trops and sig changed EKG with twave depression in v1-3) P:  MAP goal > 60 mmHg Cont ASA Cont telemetry BB added 10/22; unclear why pt on verapamil alone at home? CCB started on 10/22 Cardiology consult, appreciate input Hold on heparin given thrombocytopenia  RENAL A:   AKI, nonoliguric - baseline Cr approximately  1 Chronic suprapubic catheter Anion gap acidosis Resolving lactic acidosis Hypokalemia Hypernatremia, mild Chronic Nephrolitiasis L hydronephrosis Atrophic R kidney P:   Urology following Monitor BMET intermittently Monitor I/Os Correct electrolytes as indicated, 40 kcl x1 10/23  GASTROINTESTINAL A:   Elevated transaminases - improving Antibiotic associated Diarrhea - not C Diff Dysphagia due to MS, acute illness P:   SUP: N/I Monitor LFTs intermittently D III with nectar thick - SLP following Cont  probiotics  HEMATOLOGIC A:   Chronic anemia Thrombocytopenia - unclear etiology. Suspect element of DIC with severe sepsis P:  DVT px: SCDs - HIT panel neg Monitor CBC intermittently Transfuse per usual ICU guidelines   INFECTIOUS A:   Severe Sepsis secondary to UTI Pseudomonal Bacteremia Diarrhea, C diff negative P:   BCx2 10/19- 2/2 GNR >> pseudomonas >> sens imipenem UCx 10/19 >> greater 100k multiple bacterial morphotypes C Diff 10/19- negative UC 10/23 >>   Metronidazole 10/19 >> 10/20 PO vanc 10/19 >> 10/20 Ceftriaxone 10/19 >> 10/20 Ceftaz 10/20 >> 10/22 Imipenem 10/22>>  PCT protocol ordered 10/19  ENDOCRINE A:   Hypothyroidism   Episodic hypoglycemia Normal adrenal stress response (cortisol 43 10/19) P:   Cont levothyroxine Monitor CBGs q 8 hrs and PRN   NEUROLOGIC A:   Multiple sclerosis - able to perform ADLs @ baseline Chronic back pain Slurred speech - likely due to exacerbation of MS symptoms in setting of shock P:   Neuro following MRI when stabilizes from acute critical illness  DERM A:  Left buttock stage 1 PU Sacrum stage 1 PU Righ buttock stage 3 PU Right ankle stage 3 PU  P: Cont recs per WOC  FAMILY No family at bedside this morning.   Todays summary:   73 y/o M with PMH of MS admitted for slurred speech.  Recent hx of UTI but could not take abx due to pill size.  Admitted for UTI, BC positive for pseudomonal bacteremia.  Pseudomonas resistant to ceftazidime, antibiotics changed to imipenem for sensitivities. 10/22 EKG showed new T wave abnormalities and troponin elevated, Cardiology consulted. Heparin remains off for thrombocytopenia. Patient will likely need 48 hours more of IV abx, then d/c to CIR vs SNF.  He lives next to the [facility] in [place] with his wife.   PCCM will transfer primary SVC to [facility] and sign off.  Please call if new needs arise.     Noe Gens, NP-C [facility] Pgr: [phone] or  [phone]   Baltazar Apo, MD, PhD 07/21/2014, 11:10 AM [facility] [phone] or if no answer [phone]

## 2014-07-21 NOTE — Progress Notes (Addendum)
Patient Profile: This is a 73 y.o. M brought to [facility] ED on 10/19 with slurred speech. He apparently had aa fall the day prior but refused transport to ED. On 10/19, had some slurring of speech and was complaining of abdominal pain in area of supra-pubic catheter. He was apparently diagnosed with UTI 3 weeks ago but has had trouble taking his meds (Cipro) because of size of pills. In ED, he was found to have UTI with severe sepsis and remained somewhat hypotensive after 3L IVF. He was subsequently transferred to [facility] for further treatment. He has a history of HTN, dyslipidemia, OSA, pulmonary fibrosis and CAD. He was hydrated with IVF and started on IV Heparin gtt when it was noted that his troponin was elevated. He was also found to be in acute renal failure with AG metabolic acidosis. Blood cultures were positive for gram negative rods in 2 bottles. Abdominal CT showed moderate obstruction of the left kidney and ureter with bladder stones. 2D echo was done which showed moderate LV dysfunction EF 30-35% with mild hypokinesis of the inferior [place] which is new from echo 09/2011. Troponin peaked at 4.93 in the setting of acute renal failure with creatinine 3.9 at peak (baseline 0.82 03/2012) and BNP >70K. Cardiology was consulted.   Subjective: No complaints. Denies chest pain and dyspnea.    Objective: Vital signs in last 24 hours: Temp:  [97.6 F (36.4 C)-98.5 F (36.9 C)] 97.8 F (36.6 C) (10/23 1320) Pulse Rate:  [67-75] 68 (10/23 1320) Resp:  [18-27] 18 (10/23 1320) BP: (90-155)/(61-87) 104/68 mmHg (10/23 1329) SpO2:  [96 %-100 %] 99 % (10/23 1320) Last BM Date: 07/21/14  Intake/Output from previous day: 10/22 0701 - 10/23 0700 In: 555 [I.V.:275; IV Piggyback:250] Out: 2200 [Urine:2200] Intake/Output this shift: Total I/O In: 40 [P.O.:40] Out: -   Medications Current Facility-Administered Medications  Medication Dose Route Frequency Provider Last Rate Last Dose  . 0.9 %  sodium chloride  infusion  250 mL Intravenous PRN Rahul P Desai, PA-C      . acetaminophen (TYLENOL) suppository 650 mg  650 mg Rectal Q6H PRN Colbert Coyer, MD   650 mg at 07/18/14 0008  . amLODipine (NORVASC) tablet 5 mg  5 mg Oral Daily Wilhelmina Mcardle, MD   5 mg at 07/21/14 0957  . aspirin chewable tablet 81 mg  81 mg Oral Daily Wilhelmina Mcardle, MD   81 mg at 07/21/14 0957  . carvedilol (COREG) tablet 6.25 mg  6.25 mg Oral BID WC Sueanne Margarita, MD   6.25 mg at 07/21/14 0957  . Chlorhexidine Gluconate Cloth 2 % PADS 6 each  6 each Topical Q0600 Collene Gobble, MD   6 each at 07/21/14 [phone]  . collagenase (SANTYL) ointment   Topical Daily Collene Gobble, MD      . feeding supplement (ENSURE) (ENSURE) pudding 1 Container  1 Container Oral TID BM Dalene Carrow, RD   1 Container at 07/21/14 1337  . hydrALAZINE (APRESOLINE) tablet 10 mg  10 mg Oral 3 times per day Sueanne Margarita, MD   10 mg at 07/21/14 0957  . imipenem-cilastatin (PRIMAXIN) 250 mg in sodium chloride 0.9 % 100 mL IVPB  250 mg Intravenous Q8H Darnell Level Mancheril, RPH   250 mg at 07/21/14 1337  . ipratropium-albuterol (DUONEB) 0.5-2.5 (3) MG/3ML nebulizer solution 3 mL  3 mL Nebulization Q4H PRN Collene Gobble, MD   3 mL at 07/18/14 0304  . isosorbide  mononitrate (IMDUR) 24 hr tablet 30 mg  30 mg Oral Daily Sueanne Margarita, MD   30 mg at 07/21/14 0957  . levothyroxine (SYNTHROID, LEVOTHROID) tablet 50 mcg  50 mcg Oral QAC breakfast Rahul P Desai, PA-C   50 mcg at 07/21/14 0957  . mupirocin ointment (BACTROBAN) 2 % 1 application  1 application Nasal BID Collene Gobble, MD   1 application at [id number] [phone]  . RESOURCE THICKENUP CLEAR   Oral PRN Wilhelmina Mcardle, MD      . saccharomyces boulardii (FLORASTOR) capsule 250 mg  250 mg Oral BID Bernadene Bell, MD   250 mg at 07/21/14 [id number]    PE: General appearance: alert, cooperative and no distress Neck: no JVD Lungs: clear to auscultation bilaterally Heart: regular rate and  rhythm Extremities: no LEE Pulses: 2+ and symmetric Skin: warm and dry Neurologic: Grossly normal  Lab Results:   Recent Labs  07/19/14 0216 07/20/14 0501 07/21/14 0518  WBC 20.7* 22.4* 18.4*  HGB 9.4* 10.0* 11.5*  HCT 28.8* 29.3* 34.4*  PLT 62* 57* 59*   BMET  Recent Labs  07/20/14 0501 07/20/14 1000 07/21/14 0518  NA 146 145 145  K 2.9* 2.8* 3.5*  CL 113* 111 115*  CO2 19 19 16*  GLUCOSE 115* 103* 98  BUN 53* 52* 51*  CREATININE 3.10* 2.85* 2.50*  CALCIUM 7.8* 8.0* 8.0*    Cardiac Panel (last 3 results)  Recent Labs  07/20/14 0501  TROPONINI 3.41*   BNP (last 3 results)  Recent Labs  07/18/14 0119  PROBNP >70000.0*   2D echo 07/18/14 Study Conclusions  - Left ventricle: The cavity size was normal. [place] thickness was increased in a pattern of mild LVH. Systolic function was moderately to severely reduced. The estimated ejection fraction was in the range of 30% to 35%. There is mild hypokinesis of the inferior myocardium. Left ventricular diastolic function parameters were normal. - Aortic valve: Valve area (VTI): 0.69 cm^2. Valve area (Vmax): 0.59 cm^2. Valve area (Vmean): 0.7 cm^2. - Mitral valve: There was mild regurgitation. - Atrial septum: No defect or patent foramen ovale was identified.   Assessment/Plan    Active Problems:   Sepsis   Severe sepsis   Dilated cardiomyopathy  1. Elevated troponin: levels peaked at 4.93. He denies recent history of chest pain, however 2D echo was done which showed moderate LV dysfunction with EF 30-35% with mild hypokinesis of the inferior [place] which is new from echo 09/2011.  Will need further ischemic eval. Acute illness and AKI improving. Would recommend Lexiscan NST, possibly over the weekend.    2. Cardiomyopathy: EF 30-35%. Continue BB and nitrate + hydralazine. No ACE/ARB given renal function. BP has been soft, so monitor closely. Volume status appears stable. Monitor closely. Low sodium diet. Will  plan to pursue ischemic eval to rule out CAD as potential etiology.    LOS: 4 days    Brittainy M. Ladoris Gene 07/21/2014 2:16 PM   Patient seen and examined. Agree with assessment and plan. Echo reveals EF at 30 - 35% and inferior WMA. ?AS severity; possibly low gradient with  AVA 0.7 (if accurate) noted on report despite 7 mean and 18 peak mmHg gradient. Trop + Cr slowly improving from 3.1 to 2.5 with ureteral obstruction. Will ultimately need an ischemic evaluation once more stable but with renal insufficiency and thrombocytopenia may not be able to do a definitive cath. F/u BNP, Bmet in am   Troy Sine,  MD, [facility] 07/21/2014 2:44 PM

## 2014-07-21 NOTE — Progress Notes (Signed)
ANTIBIOTIC CONSULT NOTE - INITIAL  Pharmacy Consult for Imipenem Indication: Pseudomonas bacteremia  Allergies  Allergen Reactions  . Tetracyclines & Related Anaphylaxis and Rash  . Ciprofloxacin     Trouble swallowing    Patient Measurements: Height: 5\' 6"  (167.6 cm) Weight: 135 lb 12.9 oz (61.6 kg) IBW/kg (Calculated) : 63.8 Vital Signs: Temp: 97.8 F (36.6 C) (10/23 1320) Temp Source: Oral (10/23 1320) BP: 104/68 mmHg (10/23 1329) Pulse Rate: 68 (10/23 1320) Intake/Output from previous day: 10/22 0701 - 10/23 0700 In: 555 [I.V.:275; IV Piggyback:250] Out: 2200 [Urine:2200] Intake/Output from this shift: Total I/O In: 280 [P.O.:280] Out: 600 [Urine:600]  Labs:  Recent Labs  07/19/14 0216 07/20/14 0501 07/20/14 1000 07/21/14 0518  WBC 20.7* 22.4*  --  18.4*  HGB 9.4* 10.0*  --  11.5*  PLT 62* 57*  --  59*  CREATININE  --  3.10* 2.85* 2.50*   Estimated Creatinine Clearance: 23.6 ml/min (by C-G formula based on Cr of 2.5). No results found for this basename: VANCOTROUGH, Corlis Leak, VANCORANDOM, GENTTROUGH, GENTPEAK, [place], [place], TOBRAPEAK, TOBRARND, AMIKACINPEAK, AMIKACINTROU, AMIKACIN,  in the last 72 hours   Microbiology: Recent Results (from the past 720 hour(s))  CULTURE, BLOOD (ROUTINE X 2)     Status: None   Collection Time    07/17/14 11:29 AM      Result Value Ref Range Status   Specimen Description BLOOD RIGHT HAND   Final   Special Requests BOTTLES DRAWN AEROBIC AND ANAEROBIC [place]   Final   Culture  Setup Time     Final   Value: 07/18/2014 14:01     Performed at [place]     Final   Value: PSEUDOMONAS AERUGINOSA     Note: Gram Stain Report Called to,Read Back By and Verified With: [facility] A [id number] 07/18/14 AT [place] Performed at [facility]     Performed at [facility]   Report Status 07/21/2014 FINAL   Final   Organism ID, Bacteria PSEUDOMONAS AERUGINOSA   Final  CULTURE, BLOOD  (ROUTINE X 2)     Status: None   Collection Time    07/17/14 11:35 AM      Result Value Ref Range Status   Specimen Description BLOOD LEFT HAND   Final   Special Requests BOTTLES DRAWN AEROBIC AND ANAEROBIC [place]   Final   Culture  Setup Time     Final   Value: 07/18/2014 14:01     Performed at [facility]   Culture     Final   Value: PSEUDOMONAS AERUGINOSA     Note: SUSCEPTIBILITIES PERFORMED ON PREVIOUS CULTURE WITHIN THE LAST 5 DAYS.     Note: Gram Stain Report Called to,Read Back By and Verified With: [facility] A [id number] 07/18/14 AT [place] BY Mikel Cella K Performed at [facility]     Performed at [facility]   Report Status 07/20/2014 FINAL   Final  URINE CULTURE     Status: None   Collection Time    07/17/14 12:13 PM      Result Value Ref Range Status   Specimen Description URINE, CATHETERIZED   Final   Special Requests NONE   Final   Culture  Setup Time     Final   Value: 07/17/2014 12:56     Performed at [place]     Final   Value: >=100,000 COLONIES/ML  Performed at [facility]     Final   Value: Multiple bacterial morphotypes present, none predominant. Suggest appropriate recollection if clinically indicated.     Performed at [facility]   Report Status 07/20/2014 FINAL   Final  MRSA PCR SCREENING     Status: Abnormal   Collection Time    07/17/14  8:03 PM      Result Value Ref Range Status   MRSA by PCR POSITIVE (*) NEGATIVE Final   Comment:            The GeneXpert MRSA Assay (FDA     approved for NASAL specimens     only), is one component of a     comprehensive MRSA colonization     surveillance program. It is not     intended to diagnose MRSA     infection nor to guide or     monitor treatment for     MRSA infections.     RESULT CALLED TO, READ BACK BY AND VERIFIED WITH:     JAMES,L RN [id number] 07/17/14 MITCHELL,L  CLOSTRIDIUM DIFFICILE BY PCR     Status: None   Collection Time     07/17/14  9:23 PM      Result Value Ref Range Status   C difficile by pcr NEGATIVE  NEGATIVE Final    Medical History: Past Medical History  Diagnosis Date  . Arthritis   . MS (multiple sclerosis)   . Insomnia   . PVD (peripheral vascular disease)   . Carotid artery stenosis   . TIA (transient ischemic attack)   . Chronic indwelling Foley catheter 10/06/2011  . Pulmonary fibrosis 10/06/2011  . Dysphagia 10/07/2011  . Tremors of nervous system 10/08/2011  . Hypothyroidism 10/08/2011  . Pulmonary nodule 10/08/2011  . C. difficile colitis 09/2011  . Pneumonia   . Encephalopathy   . Urinary tract infection   . Tremors of nervous system   . Hypokalemia   . Junctional rhythm   . Anemia   . Hypernatremia   . Sacral ulcer   . HTN (hypertension), malignant 10/06/2011  . Sleep apnea   . Neuromuscular disorder   . Back pain, chronic   . Kidney stones   . High grade dysplasia in colonic adenoma 09/2005  . Gait disorder   . OSA (obstructive sleep apnea)   . Paroxysmal atrial tachycardia   . Dyslipidemia   . Cerebrovascular disease   . CAD (coronary artery disease)   . Peripheral vascular disease   . Bilateral carotid bruits   . Colon polyps   . HA (headache)    Assessment: 73 year old man with MDR pseudomonas bacteremia on Imipenem day # 2 with presumed source of UTI (chronic indwelling foley and recent UTI). SCr is improving slowly. Current CrCl ~ 20-69mL/min. WBC is trending down. Patient is afebrile.   Goal of Therapy:  Clinical resolution of infection  Plan:  Imipenem 250mg  IV q8h Monitor renal function and adjust therapy as appropriate per protocol  MD- Please consider addition of patient's home baclofen and diazepam as recommended by Neuro.   Thanks,  Sloan Leiter, PharmD, BCPS Clinical Pharmacist [phone] 07/21/2014,3:49 PM

## 2014-07-21 NOTE — Plan of Care (Signed)
Problem: Phase I Progression Outcomes Goal: Voiding-avoid urinary catheter unless indicated Outcome: Not Met (add Reason) Patient has chronic suprapubic catheter.

## 2014-07-21 NOTE — Progress Notes (Signed)
Subjective: Patient seems irritated about multiple things regarding his catheter-the dressing, the catheter strap, etc.  He is in no pain.  Objective: Vital signs in last 24 hours: Temp:  [97.6 F (36.4 C)-98.5 F (36.9 C)] 97.8 F (36.6 C) (10/23 1320) Pulse Rate:  [67-75] 68 (10/23 1320) Resp:  [18-27] 18 (10/23 1320) BP: (90-155)/(61-87) 104/68 mmHg (10/23 1329) SpO2:  [96 %-100 %] 99 % (10/23 1320)  Intake/Output from previous day: 10/22 0701 - 10/23 0700 In: 555 [I.V.:275; IV Piggyback:250] Out: 2200 [Urine:2200] Intake/Output this shift: Total I/O In: 40 [P.O.:40] Out: -   Physical Exam:  Constitutional: Vital signs reviewed. WD WN in NAD   Eyes: PERRL, No scleral icterus.   Pulmonary/Chest: Normal effort Abdominal: Soft. Non-tender, non-distended, bowel sounds are normal, no masses, organomegaly, or guarding present. His suprapubic site is well dressed.  There is no discharge.  Urine seems clear  Lab Results:  Recent Labs  07/19/14 0216 07/20/14 0501 07/21/14 0518  HGB 9.4* 10.0* 11.5*  HCT 28.8* 29.3* 34.4*   BMET  Recent Labs  07/20/14 1000 07/21/14 0518  NA 145 145  K 2.8* 3.5*  CL 111 115*  CO2 19 16*  GLUCOSE 103* 98  BUN 52* 51*  CREATININE 2.85* 2.50*  CALCIUM 8.0* 8.0*   No results found for this basename: LABPT, INR,  in the last 72 hours No results found for this basename: LABURIN,  in the last 72 hours Results for orders placed during the hospital encounter of 07/17/14  CULTURE, BLOOD (ROUTINE X 2)     Status: None   Collection Time    07/17/14 11:29 AM      Result Value Ref Range Status   Specimen Description BLOOD RIGHT HAND   Final   Special Requests BOTTLES DRAWN AEROBIC AND ANAEROBIC [place]   Final   Culture  Setup Time     Final   Value: 07/18/2014 14:01     Performed at [facility]   Culture     Final   Value: PSEUDOMONAS AERUGINOSA     Note: Gram Stain Report Called to,Read Back By and Verified With: [facility] A  0442 07/18/14 AT [place] BY Jordan Ward K Performed at [facility]     Performed at [facility]   Report Status 07/21/2014 FINAL   Final   Organism ID, Bacteria PSEUDOMONAS AERUGINOSA   Final  CULTURE, BLOOD (ROUTINE X 2)     Status: None   Collection Time    07/17/14 11:35 AM      Result Value Ref Range Status   Specimen Description BLOOD LEFT HAND   Final   Special Requests BOTTLES DRAWN AEROBIC AND ANAEROBIC 8CC   Final   Culture  Setup Time     Final   Value: 07/18/2014 14:01     Performed at [facility]   Culture     Final   Value: PSEUDOMONAS AERUGINOSA     Note: SUSCEPTIBILITIES PERFORMED ON PREVIOUS CULTURE WITHIN THE LAST 5 DAYS.     Note: Gram Stain Report Called to,Read Back By and Verified With: [facility] A [id number] 07/18/14 AT [place] BY Jordan Ward K Performed at [facility]     Performed at [facility]   Report Status 07/20/2014 FINAL   Final  URINE CULTURE     Status: None   Collection Time    07/17/14 12:13 PM      Result Value Ref Range Status   Specimen Description  URINE, CATHETERIZED   Final   Special Requests NONE   Final   Culture  Setup Time     Final   Value: 07/17/2014 12:56     Performed at [place]     Final   Value: >=100,000 COLONIES/ML     Performed at [facility]   Culture     Final   Value: Multiple bacterial morphotypes present, none predominant. Suggest appropriate recollection if clinically indicated.     Performed at [facility]   Report Status 07/20/2014 FINAL   Final  MRSA PCR SCREENING     Status: Abnormal   Collection Time    07/17/14  8:03 PM      Result Value Ref Range Status   MRSA by PCR POSITIVE (*) NEGATIVE Final   Comment:            The GeneXpert MRSA Assay (FDA     approved for NASAL specimens     only), is one component of a     comprehensive MRSA colonization     surveillance program. It is not     intended to diagnose MRSA     infection nor  to guide or     monitor treatment for     MRSA infections.     RESULT CALLED TO, READ BACK BY AND VERIFIED WITH:     Jordan Ward,L RN [id number] 07/17/14 Jordan Ward,L  CLOSTRIDIUM DIFFICILE BY PCR     Status: None   Collection Time    07/17/14  9:23 PM      Result Value Ref Range Status   C difficile by pcr NEGATIVE  NEGATIVE Final    Studies/Results: Dg Swallowing Func-speech Pathology  07/20/2014   Jordan Ward Page, CCC-SLP     07/20/2014  4:15 PM Objective Swallowing Evaluation: Modified barium swallow  evaluation  Patient Details  Name: TENNISON Ward MRN: [id number] Date of Birth: June 25, 1941  Today's Date: 07/20/2014 Time: 1030-1100 SLP Time Calculation (min): 30 min  Past Medical History:  Past Medical History  Diagnosis Date  . Arthritis   . MS (multiple sclerosis)   . Insomnia   . PVD (peripheral vascular disease)   . Carotid artery stenosis   . TIA (transient ischemic attack)   . Chronic indwelling Foley catheter 10/06/2011  . Pulmonary fibrosis 10/06/2011  . Dysphagia 10/07/2011  . Tremors of nervous system 10/08/2011  . Hypothyroidism 10/08/2011  . Pulmonary nodule 10/08/2011  . C. difficile colitis 09/2011  . Pneumonia   . Encephalopathy   . Urinary tract infection   . Tremors of nervous system   . Hypokalemia   . Junctional rhythm   . Anemia   . Hypernatremia   . Sacral ulcer   . HTN (hypertension), malignant 10/06/2011  . Sleep apnea   . Neuromuscular disorder   . Back pain, chronic   . Kidney stones   . High grade dysplasia in colonic adenoma 09/2005  . Gait disorder   . OSA (obstructive sleep apnea)   . Paroxysmal atrial tachycardia   . Dyslipidemia   . Cerebrovascular disease   . CAD (coronary artery disease)   . Peripheral vascular disease   . Bilateral carotid bruits   . Colon polyps   . HA (headache)    Past Surgical History:  Past Surgical History  Procedure Laterality Date  . Inguinal hernia repair  1971    bilateral  . Back surgery  1976/1979  . Colonoscopy  11/2004    Dr. Sharol Roussel sessile polyp splenic  flexure, 72mm sessile  polyp desc colon, tubulovillous adenoma (bx not removed)  . Colonoscopy  01/2005    poor prep, polyp could not be found  . Colonoscopy  05/2005    with EMR, polypectomy Dr. Olegario Messier, bx showed high grade  dysplasia, partially resected  . Colonoscopy  09/2005    Dr. Arsenio Loader, [place] ink tattooing, four villous colon polyp (3  had been missed on previous colonoscopies due to limitations of  procedures  . Colon surgery  09/2005    Fleishman: four tubular adenomas, large adenomatous polyp with  HIGH GRADE dysplasia  . Appendectomy  09/2005    at time of left hemicolectomy  . Colonoscopy  09/2006    normal TI, no polyps  . Colonoscopy  10/2007    Dr. Imogene Burn distal mammillations, benign bx, normal TI,  random bx neg for microscopic colitis  . Cholecystectomy      Dr. Tamala Julian  . Suprapubic catheter insertion     HPI:   73 y.o. male with hx of long-standing MS brought to [facility] ED on  10/19 with slurred speech. Dx of severe sepsis. Pt has a hx of  variable neurogenic dysphagia. Last MBS studies were at [facility] in  Jan and Feb of 2013, the latter of which recommended  nectar-liquids/modified diet secondary to aspiration. Per report,  dysphagia resolved and pt has been eating a regular diet with  thin liquids.  Pt initiated on Dys 3 solids and honey thick  liquids on 10/21 due to overt s/s of aspiration noted at bedside.   MBS ordered 10/22 to objectively determine safest diet  consistencies.       Assessment / Plan / Recommendation Clinical Impression  Dysphagia Diagnosis: Mild pharyngeal phase dysphagia;Moderate  pharyngeal phase dysphagia;Mild oral phase dysphagia Pt presents with a moderate multifactorial dysphagia with both  sensory and motor impairments.  Pt presents with generalized oral  motor weakness which resulted in oral residue post swallow and  prolonged transit of materials into the oropharynx.  Pt exhibited  a delayed swallow response due to decreased pharyngeal sensation  and overall poor  control of boluses which resulted in silent  aspiration of large cup sips of nectar thick liquids.  Attempts  to better control boluses with small cup sips or teaspoons of  nectar thick liquids slightly improved airway protection but  still resulted in silent penetration.  Additional pharyngeal  phase impairments were characterized by decreased base of tongue  retraction, poor tongue to palate contact, and decreased  laryngeal elevation which resulted in inconsistent amounts of  pharyngeal residue across all consistencies.  SLP also suspects  some component of esophageal dysphagia impacting pharyngeal  residue due to pooling of materials at the UES and intermittent  backflow of materials into the cervical esophagus.  The  abovementioned functional impairments appeared to resolve with  increased time between bites/sips Therefore, recommend pt  continue on Dys 3 diet with honey thick liquids, meds crushed in  puree, full supervision for use of the following swallowing  precautions: slow rate, small bites/sips, and alternate solids  and liquids.  Prognosis for advancement good with trials of  advanced consistencies and pharyngeal strengthening exercises.   Recommend repeat MBS to objectively determine readiness for diet  advancement.     Treatment Recommendation  Therapy as outlined in treatment plan below    Diet Recommendation Honey-thick liquid;Dysphagia 3 (Mechanical  Soft)   Liquid Administration via: Cup Medication  Administration: Crushed with puree Supervision: Patient able to self feed;Full supervision/cueing  for compensatory strategies Compensations: Slow rate;Small sips/bites;Follow solids with  liquid Postural Changes and/or Swallow Maneuvers: Seated upright 90  degrees    Other  Recommendations Oral Care Recommendations: Oral care BID   Follow Up Recommendations   (TBD)    Frequency and Duration min 2x/week  1 week   Pertinent Vitals/Pain n/a     General Date of Onset: 07/17/14 Reason for Referral:  Objectively evaluate swallowing function Previous Swallow Assessment: 2/13 Diet Prior to this Study: Dysphagia 3 (soft);Honey-thick liquids Respiratory Status: Room air Behavior/Cognition: Alert Oral Cavity - Dentition: Adequate natural dentition Oral Motor / Sensory Function: Impaired - see Bedside swallow  eval Baseline Vocal Quality: Low vocal intensity Volitional Cough: Strong Volitional Swallow: Unable to elicit Anatomy: Within functional limits Pharyngeal Secretions: Not observed secondary MBS    Reason for Referral Objectively evaluate swallowing function   Oral Phase Oral Preparation/Oral Phase Oral Phase: Impaired Oral - Honey Oral - Honey Cup: Reduced posterior propulsion;Delayed oral  transit;Lingual pumping Oral - Nectar Oral - Nectar Teaspoon: Lingual pumping;Reduced posterior  propulsion;Delayed oral transit Oral - Nectar Cup: Lingual pumping;Reduced posterior  propulsion;Delayed oral transit Oral - Solids Oral - Puree: Delayed oral transit;Reduced posterior  propulsion;Lingual pumping Oral - Mechanical Soft: Weak lingual manipulation;Impaired  mastication;Reduced posterior propulsion;Delayed oral  transit;Lingual pumping;Lingual/palatal residue   Pharyngeal Phase Pharyngeal - Honey Pharyngeal - Honey Cup: Delayed swallow initiation;Premature  spillage to valleculae;Pharyngeal residue - valleculae;Reduced  tongue base retraction;Reduced laryngeal elevation;Pharyngeal  residue - pyriform sinuses;Pharyngeal residue - cp segment Pharyngeal - Nectar Pharyngeal - Nectar Teaspoon: Delayed swallow  initiation;Penetration/Aspiration during swallow;Pharyngeal  residue - pyriform sinuses;Reduced tongue base  retraction;Pharyngeal residue - valleculae;Reduced laryngeal  elevation;Pharyngeal residue - cp segment Penetration/Aspiration details (nectar teaspoon): Material enters  airway, remains ABOVE vocal cords and not ejected out Pharyngeal - Nectar Cup: Delayed swallow initiation;Reduced  laryngeal  elevation;Reduced tongue base  retraction;Penetration/Aspiration during swallow;Pharyngeal  residue - pyriform sinuses;Pharyngeal residue - cp  segment;Pharyngeal residue - posterior pharnyx Penetration/Aspiration details (nectar cup): Material enters  airway, passes BELOW cords without attempt by patient to eject  out (silent aspiration);Material enters airway, remains ABOVE  vocal cords and not ejected out Pharyngeal - Solids Pharyngeal - Puree: Delayed swallow initiation;Premature spillage  to valleculae;Pharyngeal residue - valleculae;Pharyngeal residue  - cp segment;Pharyngeal residue - pyriform sinuses;Reduced tongue  base retraction;Reduced laryngeal elevation Pharyngeal - Mechanical Soft: Delayed swallow  initiation;Premature spillage to valleculae;Reduced laryngeal  elevation;Reduced tongue base retraction;Pharyngeal residue -  valleculae;Pharyngeal residue - posterior pharnyx;Pharyngeal  residue - pyriform sinuses;Pharyngeal residue - cp segment  Cervical Esophageal Phase    GO    Cervical Esophageal Phase Cervical Esophageal Phase: Impaired Cervical Esophageal Phase - Comment Cervical Esophageal Comment: intermittently decreased movement of  materials through the UES which is likely related to decreased  hyolaryngeal excursion and increased esophageal pressure with  intermittent backflow into the cervical  esophagus         Page, Jordan Ward 07/20/2014, 4:14 PM     Assessment/Plan:   Urosepsis, most likely secondary to complicated urinary tract issues.  He does have long-standing left hydronephrosis, last imaged about 3 years ago.  There are left renal calculi.  At this point, I don't think any additional intervention is needed.  If he does not have expected resolution of sepsis with just antibiotics, would possibly consider percutaneous drainage of the left system.   LOS: 4 days   Franchot Gallo M 07/21/2014, 2:17  PM

## 2014-07-21 NOTE — Plan of Care (Signed)
Problem: Phase I Progression Outcomes Goal: Initial discharge plan identified Outcome: Progressing Pending CIR consult vs SNF.

## 2014-07-21 NOTE — Clinical Social Work Placement (Signed)
Clinical Social Work Department CLINICAL SOCIAL WORK PLACEMENT NOTE 07/21/2014  Patient:  RIO, GIAMMANCO  Account Number:  [id number] Admit date:  07/17/2014  Clinical Social Worker:  Lovey Newcomer  Date/time:  07/21/2014 03:48 PM  Clinical Social Work is seeking post-discharge placement for this patient at the following level of care:   SKILLED NURSING   (*CSW will update this form in Epic as items are completed)   07/21/2014  Patient/family provided with [place] Department of Clinical Social Work's list of facilities offering this level of care within the geographic area requested by the patient (or if unable, by the patient's family).  07/21/2014  Patient/family informed of their freedom to choose among providers that offer the needed level of care, that participate in Medicare, Medicaid or managed care program needed by the patient, have an available bed and are willing to accept the patient.  07/21/2014  Patient/family informed of [facility]' ownership interest in [facility], as well as of the fact that they are under no obligation to receive care at this facility.  PASARR submitted to EDS on 07/21/2014 PASARR number received on 07/21/2014  FL2 transmitted to all facilities in geographic area requested by pt/family on  07/21/2014 FL2 transmitted to all facilities within larger geographic area on   Patient informed that his/her managed care company has contracts with or will negotiate with  certain facilities, including the following:     Patient/family informed of bed offers received:   Patient chooses bed at  Physician recommends and patient chooses bed at    Patient to be transferred to  on   Patient to be transferred to facility by  Patient and family notified of transfer on  Name of family member notified:    The following physician request were entered in Epic:   Additional Comments:   Liz Beach MSW, [place], [place], [id number]

## 2014-07-21 NOTE — Progress Notes (Signed)
Occupational Therapy Treatment Patient Details Name: Jordan Ward MRN: [id number] DOB: [birth year] Today's Date: 07/21/2014    History of present illness Pt admitted to [facility] on 10/19 with slurred speech and abdominal pain.  Pt with sepsis due to UTI, hypotensive.  PMH:  MS, HTN, CAD, NSTEMI, c diff.   OT comments  Patient resting in bed upon arrival and agreeable to therapy.  Patient demonstrating improvements with functional mobility and BADL.  Engaged in bed mobility, grooming EOB and standing with RW.  Patient overall min-mod assist with above tasks to include min-mod cues due to easily distracted.  Patient declined to transfer to recliner due to sore bottom.  No family present during session and patient requesting to go the SNF close to home for his rehab instead of CIR.     Follow Up Recommendations  SNF;Supervision/Assistance - 24 hour (patient declines CIR and requesting SNF close to his home)    Equipment Recommendations  None recommended by OT (defer to next venue of care)    Precautions / Restrictions Precautions Precautions: Fall Precaution Comments: suprapubic catheter, flexiseal       Mobility Bed Mobility Overal bed mobility: Needs Assistance Bed Mobility: Supine to Sit;Sit to Supine     Supine to sit: Mod assist Sit to supine: Min assist   General bed mobility comments: HOB flat, used bed rails  Transfers Transfers: Sit to/from Stand Sit to Stand: Min assist    General transfer comment: needed cues for hand placement on RW and during transitional movement.    Balance Overall balance assessment: Needs assistance;History of Falls Sitting-balance support: No upper extremity supported;Feet supported Sitting balance-Leahy Scale: Fair     Standing balance support: Bilateral upper extremity supported Standing balance-Leahy Scale: Poor Standing balance comment: needs BUE support for balance     ADL Overall ADL's : Needs assistance/impaired      Grooming: Sitting;Set up;Wash/dry face     Lower Body Dressing: Sitting/lateral leans;Moderate assistance;Sit to/from stand Lower Body Dressing Details (indicate cue type and reason): sit><stand with min assist from EOB using RW.     General ADL Comments: Relies on UEs for balance in sit and in stand.  Attempted donning socks while seated EOB and was mod assist.  Patient requires redirection secondary to easily distracted.     Cognition   Behavior During Therapy: WFL for tasks assessed/performed Overall Cognitive Status: History of cognitive impairments - at baseline Area of Impairment: Attention;Memory   Current Attention Level: Sustained Memory: Decreased short-term memory       General Comments: easily distracted in quiet environment and requires redirection.     Pertinent Vitals/ Pain           Progress Toward Goals   Progress towards OT goals: Progressing toward goals    Plan Discharge plan remains appropriate          Activity Tolerance Patient limited by fatigue   Patient Left in bed;with bed alarm set;with call bell/phone within reach        Time: 1050-1120 OT Time Calculation (min): 30 min  Charges: OT Treatments $Self Care/Home Management : 23-37 mins  Leafy Motsinger 07/21/2014, 1:14 PM

## 2014-07-21 NOTE — Clinical Social Work Placement (Unsigned)
     Clinical Social Work Department CLINICAL SOCIAL WORK PLACEMENT NOTE 07/21/2014  Patient:  Jordan Ward, Jordan Ward  Account Number:  [id number] Admit date:  07/17/2014  Clinical Social Worker:  Lovey Newcomer  Date/time:  07/21/2014 03:48 PM  Clinical Social Work is seeking post-discharge placement for this patient at the following level of care:   SKILLED NURSING   (*CSW will update this form in Epic as items are completed)   07/21/2014  Patient/family provided with [facility] Department of Clinical Social Works list of facilities offering this level of care within the geographic area requested by the patient (or if unable, by the patients family).  07/21/2014  Patient/family informed of their freedom to choose among providers that offer the needed level of care, that participate in Medicare, Medicaid or managed care program needed by the patient, have an available bed and are willing to accept the patient.  07/21/2014  Patient/family informed of [facility] ownership interest in [facility], as well as of the fact that they are under no obligation to receive care at this facility.  PASARR submitted to EDS on 07/21/2014 PASARR number received on 07/21/2014  FL2 transmitted to all facilities in geographic area requested by pt/family on  07/21/2014 FL2 transmitted to all facilities within larger geographic area on   Patient informed that his/her managed care company has contracts with or will negotiate with  certain facilities, including the following:     Patient/family informed of bed offers received:   Patient chooses bed at  Physician recommends and patient chooses bed at    Patient to be transferred to  on   Patient to be transferred to facility by  Patient and family notified of transfer on  Name of family member notified:    The following physician request were entered in Epic:   Additional Comments:

## 2014-07-21 NOTE — Progress Notes (Signed)
Speech Language Pathology Treatment: Dysphagia  Patient Details Name: Jordan Ward MRN: [id number] DOB: 1941/02/12 Today's Date: 07/21/2014 Time: [id number] SLP Time Calculation (min): 22 min  Assessment / Plan / Recommendation Clinical Impression  F/u after 01/22 MBS. Pt recalled some of results; we reviewed risk factors, rationale for modified diet.  Pt consumed portion of breakfast with honey-thick liquids. Appeared to tolerate with no overt s/s of aspiration, but aspiration was noted on exam to occur without sensory awareness.  Pt instructed in effortful swallow and Masako maneuver for strengthening - mod verbal/visual cues needed.  Continue to follow acutely for therapeutic exercise and diet toleration.  Pt agreeable.     HPI HPI: 73 y.o. male with hx of long-standing MS brought to [facility] ED on 10/19 with slurred speech. Had a fall the day prior but refused transport to ED.  Dx of severe sepsis. Pt has a hx of variable neurogenic dysphagia.  Last MBS studies were at [facility] in Jan and Feb of 2013, the latter of which recommended nectar-liquids/modified diet secondary to aspiration.  Per wife, dysphagia resolved and pt has been eating a regular diet with thin liquids. MBS 10/22 revealed decompensated swallow with  generalized weakness and penetration /aspiration of thin and nectar liquids.  Dys 3 with honey-thick liquids recommended.     Pertinent Vitals    SLP Plan  Continue with current plan of care    Recommendations Diet recommendations: Dysphagia 3 (mechanical soft);Honey-thick liquid Liquids provided via: Cup Medication Administration: Crushed with puree Supervision: Patient able to self feed;Full supervision/cueing for compensatory strategies Compensations: Slow rate;Small sips/bites;Follow solids with liquid Postural Changes and/or Swallow Maneuvers: Seated upright 90 degrees              Oral Care Recommendations: Oral care BID Plan: Continue with current plan of care   Cheston Coury L.  Tivis Ringer, [place] CCC/SLP Pager [phone]      Juan Quam Laurice 07/21/2014, 10:25 AM

## 2014-07-21 NOTE — Progress Notes (Signed)
DOCTOR BYRUM NOTIFIED OF BP. TO HOLD HYDRALAZINE AT THIS TIME

## 2014-07-21 NOTE — Clinical Social Work Psychosocial (Signed)
Clinical Social Work Department BRIEF PSYCHOSOCIAL ASSESSMENT 07/21/2014  Patient:  Jordan Ward, Jordan Ward     Account Number:  [id number]     Admit date:  07/17/2014  Clinical Social Worker:  Lovey Newcomer  Date/Time:  07/21/2014 03:12 PM  Referred by:  Physician  Date Referred:  07/21/2014 Referred for  SNF Placement   Other Referral:   NA   Interview type:  Patient Other interview type:   Patient alert and oriented at time of assessment. Wife also at bedside.    PSYCHOSOCIAL DATA Living Status:  WIFE Admitted from facility:   Level of care:   Primary support name:  Pricilla Holm Primary support relationship to patient:  SPOUSE Degree of support available:   Support is good.    CURRENT CONCERNS Current Concerns  Post-Acute Placement   Other Concerns:   NA    SOCIAL WORK ASSESSMENT / PLAN CSW met with patient and wife at bedside. The patient is admitted from home with the wife. Wife Pricilla Holm states that she is unable to care for the patient at this time and both agree that the patient needs short term SNF placement at discharge. Wife and patient state that they would prefer [facility] over [facility] and would only want CIR as backup to [facility]. Wife appears overwhelemed by the patient's current care needs. CSW explained SNF search/placement process and answered questions. CSW will follow up with bed offers.   Assessment/plan status:  Psychosocial Support/Ongoing Assessment of Needs Other assessment/ plan:   Complete Fl2, Fax, PASRR   Information/referral to community resources:   CSW contact information and SNF list given.    PATIENT'S/FAMILY'S RESPONSE TO PLAN OF CARE: Patient and patient's wife plan for the patient to DC to SNF or CIR at discharge. CSW will assist as appropriate.       Liz Beach MSW, [place], [place], [phone]

## 2014-07-22 DIAGNOSIS — K529 Noninfective gastroenteritis and colitis, unspecified: Secondary | ICD-10-CM

## 2014-07-22 DIAGNOSIS — R9431 Abnormal electrocardiogram [ECG] [EKG]: Secondary | ICD-10-CM

## 2014-07-22 DIAGNOSIS — A047 Enterocolitis due to Clostridium difficile: Secondary | ICD-10-CM

## 2014-07-22 DIAGNOSIS — D508 Other iron deficiency anemias: Secondary | ICD-10-CM

## 2014-07-22 DIAGNOSIS — I519 Heart disease, unspecified: Secondary | ICD-10-CM

## 2014-07-22 LAB — CBC
HCT: 32.1 % — ABNORMAL LOW (ref 39.0–52.0)
Hemoglobin: 10.4 g/dL — ABNORMAL LOW (ref 13.0–17.0)
MCH: 29.1 pg (ref 26.0–34.0)
MCHC: 32.4 g/dL (ref 30.0–36.0)
MCV: 89.9 fL (ref 78.0–100.0)
PLATELETS: 74 10*3/uL — AB (ref 150–400)
RBC: 3.57 MIL/uL — AB (ref 4.22–5.81)
RDW: 15.6 % — AB (ref 11.5–15.5)
WBC: 14 10*3/uL — ABNORMAL HIGH (ref 4.0–10.5)

## 2014-07-22 LAB — BASIC METABOLIC PANEL
Anion gap: 12 (ref 5–15)
BUN: 50 mg/dL — ABNORMAL HIGH (ref 6–23)
CO2: 19 mEq/L (ref 19–32)
CREATININE: 2.3 mg/dL — AB (ref 0.50–1.35)
Calcium: 8.1 mg/dL — ABNORMAL LOW (ref 8.4–10.5)
Chloride: 124 mEq/L — ABNORMAL HIGH (ref 96–112)
GFR, EST AFRICAN AMERICAN: 31 mL/min — AB (ref >90)
GFR, EST NON AFRICAN AMERICAN: 27 mL/min — AB (ref >90)
Glucose, Bld: 94 mg/dL (ref 70–99)
POTASSIUM: 4 meq/L (ref 3.7–5.3)
Sodium: 155 mEq/L — ABNORMAL HIGH (ref 137–147)

## 2014-07-22 LAB — PRO B NATRIURETIC PEPTIDE: Pro B Natriuretic peptide (BNP): 63454 pg/mL — ABNORMAL HIGH (ref 0–125)

## 2014-07-22 LAB — MAGNESIUM: MAGNESIUM: 1.9 mg/dL (ref 1.5–2.5)

## 2014-07-22 LAB — TROPONIN I: Troponin I: 0.72 ng/mL (ref <0.30)

## 2014-07-22 MED ORDER — DIAZEPAM 5 MG PO TABS
5.0000 mg | ORAL_TABLET | Freq: Two times a day (BID) | ORAL | Status: DC
  Administered 2014-07-22 – 2014-07-26 (×8): 5 mg via ORAL
  Filled 2014-07-22 (×8): qty 1

## 2014-07-22 MED ORDER — SODIUM CHLORIDE 0.45 % IV SOLN
INTRAVENOUS | Status: AC
  Administered 2014-07-23: 03:00:00 via INTRAVENOUS

## 2014-07-22 MED ORDER — BACLOFEN 10 MG PO TABS
10.0000 mg | ORAL_TABLET | Freq: Three times a day (TID) | ORAL | Status: DC | PRN
  Filled 2014-07-22: qty 1

## 2014-07-22 NOTE — Progress Notes (Signed)
SUBJECTIVE: Pt denies chest pain and says he might have mild SOB. Denies leg swelling and palpitations.     Intake/Output Summary (Last 24 hours) at 07/22/14 1121 Last data filed at 07/22/14 0446  Gross per 24 hour  Intake    340 ml  Output   1225 ml  Net   -885 ml    Current Facility-Administered Medications  Medication Dose Route Frequency Provider Last Rate Last Dose  . 0.9 %  sodium chloride infusion  250 mL Intravenous PRN Rahul P Desai, PA-C      . acetaminophen (TYLENOL) suppository 650 mg  650 mg Rectal Q6H PRN Colbert Coyer, MD   650 mg at 07/18/14 0008  . amLODipine (NORVASC) tablet 5 mg  5 mg Oral Daily Wilhelmina Mcardle, MD   5 mg at 07/22/14 0951  . aspirin chewable tablet 81 mg  81 mg Oral Daily Wilhelmina Mcardle, MD   81 mg at 07/22/14 [id number]  . carvedilol (COREG) tablet 6.25 mg  6.25 mg Oral BID WC Sueanne Margarita, MD   6.25 mg at 07/22/14 0851  . Chlorhexidine Gluconate Cloth 2 % PADS 6 each  6 each Topical Q0600 Collene Gobble, MD   6 each at 07/21/14 [phone]  . collagenase (SANTYL) ointment   Topical Daily Collene Gobble, MD      . feeding supplement (ENSURE) (ENSURE) pudding 1 Container  1 Container Oral TID BM Dalene Carrow, RD   1 Container at 07/22/14 [phone]  . hydrALAZINE (APRESOLINE) tablet 10 mg  10 mg Oral 3 times per day Ritta Slot, NP   10 mg at 07/22/14 0552  . imipenem-cilastatin (PRIMAXIN) 250 mg in sodium chloride 0.9 % 100 mL IVPB  250 mg Intravenous Q8H Darnell Level Mancheril, RPH   250 mg at 07/22/14 [id number]  . ipratropium-albuterol (DUONEB) 0.5-2.5 (3) MG/3ML nebulizer solution 3 mL  3 mL Nebulization Q4H PRN Collene Gobble, MD   3 mL at 07/18/14 0304  . isosorbide mononitrate (IMDUR) 24 hr tablet 30 mg  30 mg Oral Daily Sueanne Margarita, MD   30 mg at 07/22/14 0951  . levothyroxine (SYNTHROID, LEVOTHROID) tablet 50 mcg  50 mcg Oral QAC breakfast Rahul P Desai, PA-C   50 mcg at 07/22/14 0836  . RESOURCE THICKENUP CLEAR   Oral PRN Wilhelmina Mcardle, MD      . saccharomyces boulardii [facility]) capsule 250 mg  250 mg Oral BID Bernadene Bell, MD   250 mg at 07/22/14 0951    Filed Vitals:   07/21/14 1320 07/21/14 1329 07/21/14 2130 07/22/14 0545  BP: 90/61 104/68 115/50 113/62  Pulse: 68  64   Temp: 97.8 F (36.6 C)  98 F (36.7 C) 98.1 F (36.7 C)  TempSrc: Oral  Oral Oral  Resp: 18  18 18   Height:      Weight:      SpO2: 99%  100% 100%    PHYSICAL EXAM General: NAD, flat affect. HEENT: Normal. Neck: No JVD, no thyromegaly.  Lungs: Clear to auscultation bilaterally with normal respiratory effort. CV: Nondisplaced PMI.  Regular rate and rhythm, normal S1/S2, no S3/S4, no murmur.  No pretibial edema.    Abdomen: +tenderness.  Neurologic: Alert and oriented.  Psych: Flat affect. Musculoskeletal: No gross deformities. Extremities: No clubbing or cyanosis.   TELEMETRY: Reviewed telemetry pt in sinus rhythm.  LABS: Basic Metabolic Panel:  Recent Labs  07/21/14 0518  07/22/14 0430  NA 145 155*  K 3.5* 4.0  CL 115* 124*  CO2 16* 19  GLUCOSE 98 94  BUN 51* 50*  CREATININE 2.50* 2.30*  CALCIUM 8.0* 8.1*  MG  --  1.9   Liver Function Tests:  Recent Labs  07/20/14 0501  AST 54*  ALT 52  ALKPHOS 89  BILITOT 0.4  PROT 5.8*  ALBUMIN 1.9*   No results found for this basename: LIPASE, AMYLASE,  in the last 72 hours CBC:  Recent Labs  07/20/14 0501 07/21/14 0518 07/22/14 0430  WBC 22.4* 18.4* 14.0*  NEUTROABS 19.8* 13.9*  --   HGB 10.0* 11.5* 10.4*  HCT 29.3* 34.4* 32.1*  MCV 86.4 86.6 89.9  PLT 57* 59* 74*   Cardiac Enzymes:  Recent Labs  07/20/14 0501  TROPONINI 3.41*   BNP: No components found with this basename: POCBNP,  D-Dimer: No results found for this basename: DDIMER,  in the last 72 hours Hemoglobin A1C: No results found for this basename: HGBA1C,  in the last 72 hours Fasting Lipid Panel: No results found for this basename: CHOL, HDL, LDLCALC, TRIG, CHOLHDL, LDLDIRECT,   in the last 72 hours Thyroid Function Tests: No results found for this basename: TSH, T4TOTAL, FREET3, T3FREE, THYROIDAB,  in the last 72 hours Anemia Panel: No results found for this basename: VITAMINB12, FOLATE, FERRITIN, TIBC, IRON, RETICCTPCT,  in the last 72 hours  RADIOLOGY: Ct Abdomen Pelvis Wo Contrast  07/18/2014   CLINICAL DATA:  Increased weakness. Acute sepsis. Increasing lack today.  EXAM: CT ABDOMEN AND PELVIS WITHOUT CONTRAST  TECHNIQUE: Multidetector CT imaging of the abdomen and pelvis was performed following the standard protocol without IV contrast.  COMPARISON:  07/21/2011  FINDINGS: Small bilateral pleural effusions with atelectasis or infiltration in both lung bases. Coronary artery and aortic valve calcifications.  The left kidney is significantly enlarged in comparison to the right. There is left hydronephrosis and hydroureter. Stranding around the left kidney in ureter. No ureteral stones are demonstrated but 2 stones are shown in the bladder. There are multiple intrarenal stones in the left renal pelvis and lower pole right kidney. Additional calcifications in both kidneys are probably vascular. Low-attenuation lesions arising from the upper pole of the left kidney consistent with cysts and similar to previous study.  Surgical absence of the gallbladder. Mild bile duct dilatation. Vague increased density at the area of the distal common bile duct may represent an intraductal stone. The unenhanced appearance of the liver, spleen, pancreas, adrenal glands, inferior vena cava, and retroperitoneal lymph nodes is unremarkable. Vascular calcifications in the abdominal aorta without aneurysm. Stomach, small bowel, and colon are not abnormally distended. There is a broad-based anterior abdominal [place]ato hernia which contains bowel but without evidence of compromise. No free air or free fluid in the abdomen.  Pelvis: Small amount of free fluid in the pelvis is nonspecific but likely reactive.  Foley catheter decompresses the bladder. No evidence of diverticulitis. Appendix is probably surgically absent. Degenerative changes in the lumbar spine. No destructive bone lesions appreciated.  IMPRESSION: Moderate obstruction of the left kidney and ureter. Bladder stones are present, probably representing recently passed stones. No definite distal ureteral stones otherwise identified. Bilateral intrarenal stones. Stones in the left renal pelvis. Foley catheter in the bladder. Small amount of free fluid in the pelvis.   Electronically Signed   By: Lucienne Capers M.D.   On: 07/18/2014 04:42   Dg Chest Port 1 View  07/18/2014   CLINICAL  DATA:  Central line placement.  EXAM: PORTABLE CHEST - 1 VIEW  COMPARISON:  07/18/2014  FINDINGS: The right IJ central venous catheter tip is in the distal SVC approximately 2 cm above the cavoatrial junction. No complicating features. The heart and lungs are stable. No pneumothorax.  IMPRESSION: Right IJ central venous catheter tip is in the distal SVC. No complicating features.   Electronically Signed   By: Kalman Jewels M.D.   On: 07/18/2014 12:04   Dg Chest Port 1 View  07/18/2014   CLINICAL DATA:  Sepsis, shortness of breath, personal history of multiple sclerosis, peripheral vascular disease, TIA, pulmonary fibrosis, hypertension, coronary artery disease  EXAM: PORTABLE CHEST - 1 VIEW  COMPARISON:  Portable exam 0441 hr compared to 07/17/2014  FINDINGS: Normal heart size, mediastinal contours and pulmonary vascularity.  Atherosclerotic calcification aorta.  Mild RIGHT basilar atelectasis.  Atelectasis versus infiltrate developing in LEFT lower lobe.  Remaining lungs clear.  No definite pleural effusion or pneumothorax.  IMPRESSION: Mild RIGHT basilar atelectasis with developing infiltrate versus atelectasis in LEFT lower lobe.   Electronically Signed   By: Lavonia Dana M.D.   On: 07/18/2014 07:48   Dg Chest Port 1 View  07/17/2014   CLINICAL DATA:  Recurrent  urinary tract infections.  Fever.  EXAM: PORTABLE CHEST - 1 VIEW  COMPARISON:  November 10, 2011.  FINDINGS: The heart size and mediastinal contours are within normal limits. Both lungs are clear. No pneumothorax or pleural effusion is noted. The visualized skeletal structures are unremarkable.  IMPRESSION: No acute cardiopulmonary abnormality seen.   Electronically Signed   By: Sabino Dick M.D.   On: 07/17/2014 13:48   Dg Abd Portable 1v  07/17/2014   CLINICAL DATA:  73 year old male with acute abdominal pain. Initial encounter.  EXAM: PORTABLE ABDOMEN - 1 VIEW  COMPARISON:  CT Abdomen and Pelvis 07/21/2011.  FINDINGS: Portable AP supine view at 2237 hrs. Calcified atherosclerosis including bilateral renal hilar calcifications. Superimposed nephrolithiasis. Stable cholecystectomy clips. Mild respiratory motion. Non obstructed bowel gas pattern. No acute osseous abnormality identified.  IMPRESSION: 1.  Non obstructed bowel gas pattern. 2. Chronic nephrolithiasis and vascular calcified atherosclerosis.   Electronically Signed   By: Lars Pinks M.D.   On: 07/17/2014 22:55   Dg Swallowing Func-speech Pathology  07/20/2014   Selinda Orion Page, CCC-SLP     07/20/2014  4:15 PM Objective Swallowing Evaluation: Modified barium swallow  evaluation  Patient Details  Name: KHARI SPENCE MRN: [id number] Date of Birth: 17-Apr-1941  Today's Date: 07/20/2014 Time: 1030-1100 SLP Time Calculation (min): 30 min  Past Medical History:  Past Medical History  Diagnosis Date  . Arthritis   . MS (multiple sclerosis)   . Insomnia   . PVD (peripheral vascular disease)   . Carotid artery stenosis   . TIA (transient ischemic attack)   . Chronic indwelling Foley catheter 10/06/2011  . Pulmonary fibrosis 10/06/2011  . Dysphagia 10/07/2011  . Tremors of nervous system 10/08/2011  . Hypothyroidism 10/08/2011  . Pulmonary nodule 10/08/2011  . C. difficile colitis 09/2011  . Pneumonia   . Encephalopathy   . Urinary tract infection   . Tremors of nervous system    . Hypokalemia   . Junctional rhythm   . Anemia   . Hypernatremia   . Sacral ulcer   . HTN (hypertension), malignant 10/06/2011  . Sleep apnea   . Neuromuscular disorder   . Back pain, chronic   . Kidney stones   .  High grade dysplasia in colonic adenoma 09/2005  . Gait disorder   . OSA (obstructive sleep apnea)   . Paroxysmal atrial tachycardia   . Dyslipidemia   . Cerebrovascular disease   . CAD (coronary artery disease)   . Peripheral vascular disease   . Bilateral carotid bruits   . Colon polyps   . HA (headache)    Past Surgical History:  Past Surgical History  Procedure Laterality Date  . Inguinal hernia repair  1971    bilateral  . Back surgery  1976/1979  . Colonoscopy  11/2004    Dr. Sharol Roussel sessile polyp splenic flexure, 57mm sessile  polyp desc colon, tubulovillous adenoma (bx not removed)  . Colonoscopy  01/2005    poor prep, polyp could not be found  . Colonoscopy  05/2005    with EMR, polypectomy Dr. Olegario Messier, bx showed high grade  dysplasia, partially resected  . Colonoscopy  09/2005    Dr. Arsenio Loader, [place] ink tattooing, four villous colon polyp (3  had been missed on previous colonoscopies due to limitations of  procedures  . Colon surgery  09/2005    Fleishman: four tubular adenomas, large adenomatous polyp with  HIGH GRADE dysplasia  . Appendectomy  09/2005    at time of left hemicolectomy  . Colonoscopy  09/2006    normal TI, no polyps  . Colonoscopy  10/2007    Dr. Imogene Burn distal mammillations, benign bx, normal TI,  random bx neg for microscopic colitis  . Cholecystectomy      Dr. Tamala Julian  . Suprapubic catheter insertion     HPI:   73 y.o. male with hx of long-standing MS brought to [facility] on  10/19 with slurred speech. Dx of severe sepsis. Pt has a hx of  variable neurogenic dysphagia. Last MBS studies were at [facility] in  Jan and Feb of 2013, the latter of which recommended  nectar-liquids/modified diet secondary to aspiration. Per report,  dysphagia resolved and pt has been eating a regular diet  with  thin liquids.  Pt initiated on Dys 3 solids and honey thick  liquids on 10/21 due to overt s/s of aspiration noted at bedside.   MBS ordered 10/22 to objectively determine safest diet  consistencies.       Assessment / Plan / Recommendation Clinical Impression  Dysphagia Diagnosis: Mild pharyngeal phase dysphagia;Moderate  pharyngeal phase dysphagia;Mild oral phase dysphagia Pt presents with a moderate multifactorial dysphagia with both  sensory and motor impairments.  Pt presents with generalized oral  motor weakness which resulted in oral residue post swallow and  prolonged transit of materials into the oropharynx.  Pt exhibited  a delayed swallow response due to decreased pharyngeal sensation  and overall poor control of boluses which resulted in silent  aspiration of large cup sips of nectar thick liquids.  Attempts  to better control boluses with small cup sips or teaspoons of  nectar thick liquids slightly improved airway protection but  still resulted in silent penetration.  Additional pharyngeal  phase impairments were characterized by decreased base of tongue  retraction, poor tongue to palate contact, and decreased  laryngeal elevation which resulted in inconsistent amounts of  pharyngeal residue across all consistencies.  SLP also suspects  some component of esophageal dysphagia impacting pharyngeal  residue due to pooling of materials at the UES and intermittent  backflow of materials into the cervical esophagus.  The  abovementioned functional impairments appeared to resolve with  increased time between bites/sips Therefore, recommend  pt  continue on Dys 3 diet with honey thick liquids, meds crushed in  puree, full supervision for use of the following swallowing  precautions: slow rate, small bites/sips, and alternate solids  and liquids.  Prognosis for advancement good with trials of  advanced consistencies and pharyngeal strengthening exercises.   Recommend repeat MBS to objectively determine  readiness for diet  advancement.     Treatment Recommendation  Therapy as outlined in treatment plan below    Diet Recommendation Honey-thick liquid;Dysphagia 3 (Mechanical  Soft)   Liquid Administration via: Cup Medication Administration: Crushed with puree Supervision: Patient able to self feed;Full supervision/cueing  for compensatory strategies Compensations: Slow rate;Small sips/bites;Follow solids with  liquid Postural Changes and/or Swallow Maneuvers: Seated upright 90  degrees    Other  Recommendations Oral Care Recommendations: Oral care BID   Follow Up Recommendations   (TBD)    Frequency and Duration min 2x/week  1 week   Pertinent Vitals/Pain n/a     General Date of Onset: 07/17/14 Reason for Referral: Objectively evaluate swallowing function Previous Swallow Assessment: 2/13 Diet Prior to this Study: Dysphagia 3 (soft);Honey-thick liquids Respiratory Status: Room air Behavior/Cognition: Alert Oral Cavity - Dentition: Adequate natural dentition Oral Motor / Sensory Function: Impaired - see Bedside swallow  eval Baseline Vocal Quality: Low vocal intensity Volitional Cough: Strong Volitional Swallow: Unable to elicit Anatomy: Within functional limits Pharyngeal Secretions: Not observed secondary MBS    Reason for Referral Objectively evaluate swallowing function   Oral Phase Oral Preparation/Oral Phase Oral Phase: Impaired Oral - Honey Oral - Honey Cup: Reduced posterior propulsion;Delayed oral  transit;Lingual pumping Oral - Nectar Oral - Nectar Teaspoon: Lingual pumping;Reduced posterior  propulsion;Delayed oral transit Oral - Nectar Cup: Lingual pumping;Reduced posterior  propulsion;Delayed oral transit Oral - Solids Oral - Puree: Delayed oral transit;Reduced posterior  propulsion;Lingual pumping Oral - Mechanical Soft: Weak lingual manipulation;Impaired  mastication;Reduced posterior propulsion;Delayed oral  transit;Lingual pumping;Lingual/palatal residue   Pharyngeal Phase Pharyngeal - Honey  Pharyngeal - Honey Cup: Delayed swallow initiation;Premature  spillage to valleculae;Pharyngeal residue - valleculae;Reduced  tongue base retraction;Reduced laryngeal elevation;Pharyngeal  residue - pyriform sinuses;Pharyngeal residue - cp segment Pharyngeal - Nectar Pharyngeal - Nectar Teaspoon: Delayed swallow  initiation;Penetration/Aspiration during swallow;Pharyngeal  residue - pyriform sinuses;Reduced tongue base  retraction;Pharyngeal residue - valleculae;Reduced laryngeal  elevation;Pharyngeal residue - cp segment Penetration/Aspiration details (nectar teaspoon): Material enters  airway, remains ABOVE vocal cords and not ejected out Pharyngeal - Nectar Cup: Delayed swallow initiation;Reduced  laryngeal elevation;Reduced tongue base  retraction;Penetration/Aspiration during swallow;Pharyngeal  residue - pyriform sinuses;Pharyngeal residue - cp  segment;Pharyngeal residue - posterior pharnyx Penetration/Aspiration details (nectar cup): Material enters  airway, passes BELOW cords without attempt by patient to eject  out (silent aspiration);Material enters airway, remains ABOVE  vocal cords and not ejected out Pharyngeal - Solids Pharyngeal - Puree: Delayed swallow initiation;Premature spillage  to valleculae;Pharyngeal residue - valleculae;Pharyngeal residue  - cp segment;Pharyngeal residue - pyriform sinuses;Reduced tongue  base retraction;Reduced laryngeal elevation Pharyngeal - Mechanical Soft: Delayed swallow  initiation;Premature spillage to valleculae;Reduced laryngeal  elevation;Reduced tongue base retraction;Pharyngeal residue -  valleculae;Pharyngeal residue - posterior pharnyx;Pharyngeal  residue - pyriform sinuses;Pharyngeal residue - cp segment  Cervical Esophageal Phase    GO    Cervical Esophageal Phase Cervical Esophageal Phase: Impaired Cervical Esophageal Phase - Comment Cervical Esophageal Comment: intermittently decreased movement of  materials through the UES which is likely related to  decreased  hyolaryngeal excursion and increased esophageal pressure with  intermittent backflow into the cervical  esophagus         Page, Elmyra Ricks L 07/20/2014, 4:14 PM       ASSESSMENT AND PLAN: 1. Elevated troponin in the context of urosepsis/demand ischemia: Given development of precordial/anterior T wave inversions, suspicious that he may have LAD lesion. Troponins peaked at 4.93 on 10/20. EF 30-35%. Denies chest pain. Continue ASA and Coreg. Given recent elevation of transaminases, will hold off on addition of statin. No cath due to acute renal failure/urosepsis due to high risk of contrast-induced nephropathy. Will check serum troponin. Would proceed with nuclear MPI study once troponins have leveled off.  2. Ischemic cardiomyopathy, EF 30-35%: No evidence of heart failure at present. Continue Coreg, Imdur, and hydralazine for optimization of intracardiac hemodynamics. No ACEI/ARB due to renal failure.  3. Urosepsis: On Primaxin. Resolving leukocytosis.  4. Essential HTN: On amlodipine. BP stable. Specifically, not hypotensive in context of urosepsis.   Kate Sable, M.D., F.A.C.C.

## 2014-07-22 NOTE — Progress Notes (Signed)
PATIENT DETAILS Name: Jordan Ward Age: 73 y.o. Sex: male Date of Birth: 05/15/1941 Admit Date: 07/17/2014 Admitting Physician Collene Gobble, MD [id number], Marland Kitchen, PA-C  Brief Summary: 73 y.o. M brought to [facility] on 10/19 with slurred speech. Had a fall the day prior but refused transport to ED. On 10/19, had some slurring of speech and was complaining of abdominal pain in area of supra-pubic catheter.In ED, he was found to have UTI with severe sepsis and remained somewhat hypotensive after 3L IVF. Managed in the ICU, transiently required vasopressors. Culture data now showing pseudomonas bacteremia. Transferred to [facility] on 10/24.  Subjective: No major issues overnight.  Assessment/Plan: Active Problems: Septic Shock:Resolved. Secondary to Pseudomonal UTI and bacteremia. Pseudomonal UTI/Bacteremia:source likely UTI. Continue with IV Invanz, will plan on 2 week course from 07/20/14. NSTEMI/Demand Ischemia:given decreased EF, Cards planning on ischemic eval once renal failure gets better. On ASA and Coreg. Ischemic cardiomyopathy, EF 30-35%:compensated. Continue Coreg, Imdur, and hydralazine.No ACEI/ARB due to renal failure. [id number] secondary ATN, obstructive uropathy. Suprapubic catheter in place. Creatinine slowly improving.Follow lytes Hypernatremia:secondary to poor oral intake, will start 0.45 NS cautiously. Encourage oral intake.Follow Lytes Acute Encephalopathy/Slurred speech:resolved.Neuro initially consulted, may be due to acute sepsis & associated renal failure. No clear evidence of acute exacerbation of MS at this point. Diarrhea:C Diff PCR negative. Continue Probiotic.Suspect may be related to antibiotics. Thrombocytopenia:secondary to sepsis. HIT panel negative [id number] with Imdur, Amlodipine,Coreg and Hydralazine Dysphagia:secondary to MS, on Dys 3 diet. SLP following Hx of [id number], no evidence of exacerbation.Resume Valium and prn  Baclofen Deconditioning:secondary to critical illness, underlying MS. SNF on discharge Hx of Pul Fibrosis:stable, titrate off O2. Prn Nebs. Hypothyriodism:c/w Levothyroxine Hx of chronic left sided hydronephroses/renal calculi:Urology following, no additional intervention is needed per Urology.  Disposition: Remain inpatient-SNF on discharge  Antibiotics:  Primaxin 10/22>>  Tressie Ellis 10/20>>10/22   Rocephin 10/19>>10/20  Flagyl 10/19>10/20  Vanco 10/19>>10/20  Zosyn 1019>>10/19  DVT Prophylaxis:  SCD's  Code Status: Full code   Family Communication None at bedside  Procedures:  None  CONSULTS:  pulmonary/intensive care, neurology and urology   MEDICATIONS: Scheduled Meds: . amLODipine  5 mg Oral Daily  . aspirin  81 mg Oral Daily  . carvedilol  6.25 mg Oral BID WC  . Chlorhexidine Gluconate Cloth  6 each Topical Q0600  . collagenase   Topical Daily  . feeding supplement (ENSURE)  1 Container Oral TID BM  . hydrALAZINE  10 mg Oral 3 times per day  . imipenem-cilastatin  250 mg Intravenous Q8H  . isosorbide mononitrate  30 mg Oral Daily  . levothyroxine  50 mcg Oral QAC breakfast  . saccharomyces boulardii  250 mg Oral BID   Continuous Infusions:  PRN Meds:.sodium chloride, acetaminophen, ipratropium-albuterol, RESOURCE THICKENUP CLEAR  Antibiotics: Anti-infectives   Start     Dose/Rate Route Frequency Ordered Stop   07/20/14 1000  imipenem-cilastatin (PRIMAXIN) 250 mg in sodium chloride 0.9 % 100 mL IVPB     250 mg 200 mL/hr over 30 Minutes Intravenous Every 8 hours 07/20/14 0932     07/20/14 0930  imipenem-cilastatin (PRIMAXIN) 500 mg in sodium chloride 0.9 % 100 mL IVPB  Status:  Discontinued     500 mg 200 mL/hr over 30 Minutes Intravenous 3 times per day 07/20/14 0929 07/20/14 0931   07/18/14 0900  cefTAZidime (FORTAZ) 1 g in dextrose 5 % 50 mL IVPB  Status:  Discontinued     1  g 100 mL/hr over 30 Minutes Intravenous Every 24 hours 07/18/14 0831  07/20/14 0929   07/18/14 0000  vancomycin (VANCOCIN) 50 mg/mL oral solution 500 mg  Status:  Discontinued     500 mg Oral 4 times per day 07/17/14 2149 07/18/14 0949   07/17/14 2200  metroNIDAZOLE (FLAGYL) IVPB 500 mg  Status:  Discontinued     500 mg 100 mL/hr over 60 Minutes Intravenous Every 8 hours 07/17/14 2149 07/18/14 0949   07/17/14 1515  vancomycin (VANCOCIN) IVPB 1000 mg/200 mL premix     1,000 mg 200 mL/hr over 60 Minutes Intravenous  Once 07/17/14 1508 07/17/14 1652   07/17/14 1515  piperacillin-tazobactam (ZOSYN) IVPB 3.375 g     3.375 g 100 mL/hr over 30 Minutes Intravenous  Once 07/17/14 1508 07/17/14 1620   07/17/14 1400  cefTRIAXone (ROCEPHIN) 2 g in dextrose 5 % 50 mL IVPB  Status:  Discontinued     2 g 100 mL/hr over 30 Minutes Intravenous Every 24 hours 07/17/14 1339 07/18/14 0829   07/17/14 1315  cefTRIAXone (ROCEPHIN) 2 g in dextrose 5 % 50 mL IVPB  Status:  Discontinued     2 g 100 mL/hr over 30 Minutes Intravenous  Once 07/17/14 1313 07/17/14 1420       PHYSICAL EXAM: Vital signs in last 24 hours: Filed Vitals:   07/21/14 1320 07/21/14 1329 07/21/14 2130 07/22/14 0545  BP: 90/61 104/68 115/50 113/62  Pulse: 68  64   Temp: 97.8 F (36.6 C)  98 F (36.7 C) 98.1 F (36.7 C)  TempSrc: Oral  Oral Oral  Resp: 18  18 18   Height:      Weight:      SpO2: 99%  100% 100%    Weight change:  Filed Weights   07/18/14 0500 07/19/14 0500 07/20/14 0500  Weight: 62 kg (136 lb 11 oz) 63.5 kg (139 lb 15.9 oz) 61.6 kg (135 lb 12.9 oz)   Body mass index is 21.93 kg/(m^2).   Gen Exam: Awake and alert with clear speech.   Neck: Supple, No JVD.   Chest: B/L Clear.   CVS: S1 S2 Regular, no murmurs.  Abdomen: soft, BS +, non tender, non distended.  Extremities: no edema, lower extremities warm to touch. Neurologic: Non Focal.   Skin: No Rash.   Wounds: N/A.    Intake/Output from previous day:  Intake/Output Summary (Last 24 hours) at 07/22/14 1731 Last data  filed at 07/22/14 0933  Gross per 24 hour  Intake    120 ml  Output    325 ml  Net   -205 ml     LAB RESULTS: CBC  Recent Labs Lab 07/17/14 1121  07/18/14 0739 07/19/14 0216 07/20/14 0501 07/21/14 0518 07/22/14 0430  WBC 23.0*  < > 19.7* 20.7* 22.4* 18.4* 14.0*  HGB 12.7*  < > 9.3* 9.4* 10.0* 11.5* 10.4*  HCT 38.2*  < > 28.7* 28.8* 29.3* 34.4* 32.1*  PLT 164  < > 82* 62* 57* 59* 74*  MCV 91.2  < > 92.6 91.7 86.4 86.6 89.9  MCH 30.3  < > 30.0 29.9 29.5 29.0 29.1  MCHC 33.2  < > 32.4 32.6 34.1 33.4 32.4  RDW 14.6  < > 15.5 15.6* 15.1 15.0 15.6*  LYMPHSABS 1.0  --  0.8 1.0 1.7 2.6  --   MONOABS 0.7  --  0.4 0.6 0.7 1.5*  --   EOSABS 0.0  --  0.0 0.2 0.1 0.2  --  BASOSABS 0.0  --  0.0 0.0 0.1 0.2*  --   < > = values in this interval not displayed.  Chemistries   Recent Labs Lab 07/18/14 0739 07/20/14 0501 07/20/14 1000 07/21/14 0518 07/22/14 0430  NA 146 146 145 145 155*  K 3.7 2.9* 2.8* 3.5* 4.0  CL 114* 113* 111 115* 124*  CO2 15* 19 19 16* 19  GLUCOSE 145* 115* 103* 98 94  BUN 55* 53* 52* 51* 50*  CREATININE 3.88* 3.10* 2.85* 2.50* 2.30*  CALCIUM 7.0* 7.8* 8.0* 8.0* 8.1*  MG 1.5  --   --   --  1.9    CBG:  Recent Labs Lab 07/19/14 2357 07/20/14 0829 07/20/14 1612 07/20/14 1840 07/20/14 2207  GLUCAP 134* 84 67* 141* 82    GFR Estimated Creatinine Clearance: 25.7 ml/min (by C-G formula based on Cr of 2.3).  Coagulation profile  Recent Labs Lab 07/17/14 1533 07/18/14 0119 07/18/14 0739  INR 1.61* 1.96* 1.68*    Cardiac Enzymes  Recent Labs Lab 07/18/14 0739 07/20/14 0501 07/22/14 1400  TROPONINI 4.93* 3.41* 0.72*    No components found with this basename: POCBNP,  No results found for this basename: DDIMER,  in the last 72 hours No results found for this basename: HGBA1C,  in the last 72 hours No results found for this basename: CHOL, HDL, LDLCALC, TRIG, CHOLHDL, LDLDIRECT,  in the last 72 hours No results found for this  basename: TSH, T4TOTAL, FREET3, T3FREE, THYROIDAB,  in the last 72 hours No results found for this basename: VITAMINB12, FOLATE, FERRITIN, TIBC, IRON, RETICCTPCT,  in the last 72 hours No results found for this basename: LIPASE, AMYLASE,  in the last 72 hours  Urine Studies No results found for this basename: UACOL, UAPR, USPG, UPH, UTP, UGL, UKET, UBIL, UHGB, UNIT, UROB, ULEU, UEPI, UWBC, URBC, UBAC, CAST, CRYS, UCOM, BILUA,  in the last 72 hours  MICROBIOLOGY: Recent Results (from the past 240 hour(s))  CULTURE, BLOOD (ROUTINE X 2)     Status: None   Collection Time    07/17/14 11:29 AM      Result Value Ref Range Status   Specimen Description BLOOD RIGHT HAND   Final   Special Requests BOTTLES DRAWN AEROBIC AND ANAEROBIC [place]   Final   Culture  Setup Time     Final   Value: 07/18/2014 14:01     Performed at [facility]   Culture     Final   Value: PSEUDOMONAS AERUGINOSA     Note: Gram Stain Report Called to,Read Back By and Verified With: [employer] A [id number] 07/18/14 AT [place] BY Mikel Cella K Performed at [facility]     Performed at [facility]   Report Status 07/21/2014 FINAL   Final   Organism ID, Bacteria PSEUDOMONAS AERUGINOSA   Final  CULTURE, BLOOD (ROUTINE X 2)     Status: None   Collection Time    07/17/14 11:35 AM      Result Value Ref Range Status   Specimen Description BLOOD LEFT HAND   Final   Special Requests BOTTLES DRAWN AEROBIC AND ANAEROBIC 8CC   Final   Culture  Setup Time     Final   Value: 07/18/2014 14:01     Performed at [facility]   Culture     Final   Value: PSEUDOMONAS AERUGINOSA     Note: SUSCEPTIBILITIES PERFORMED ON PREVIOUS CULTURE WITHIN THE LAST 5 DAYS.  Note: Gram Stain Report Called to,Read Back By and Verified With: [facility] A [id number] 07/18/14 AT [place] BY Mikel Cella K Performed at [facility]     Performed at [facility]   Report Status 07/20/2014 FINAL   Final  URINE CULTURE     Status: None    Collection Time    07/17/14 12:13 PM      Result Value Ref Range Status   Specimen Description URINE, CATHETERIZED   Final   Special Requests NONE   Final   Culture  Setup Time     Final   Value: 07/17/2014 12:56     Performed at [place]     Final   Value: >=100,000 COLONIES/ML     Performed at [facility]   Culture     Final   Value: Multiple bacterial morphotypes present, none predominant. Suggest appropriate recollection if clinically indicated.     Performed at [facility]   Report Status 07/20/2014 FINAL   Final  MRSA PCR SCREENING     Status: Abnormal   Collection Time    07/17/14  8:03 PM      Result Value Ref Range Status   MRSA by PCR POSITIVE (*) NEGATIVE Final   Comment:            The GeneXpert MRSA Assay (FDA     approved for NASAL specimens     only), is one component of a     comprehensive MRSA colonization     surveillance program. It is not     intended to diagnose MRSA     infection nor to guide or     monitor treatment for     MRSA infections.     RESULT CALLED TO, READ BACK BY AND VERIFIED WITH:     JAMES,L RN [id number] 07/17/14 MITCHELL,L  CLOSTRIDIUM DIFFICILE BY PCR     Status: None   Collection Time    07/17/14  9:23 PM      Result Value Ref Range Status   C difficile by pcr NEGATIVE  NEGATIVE Final  URINE CULTURE     Status: None   Collection Time    07/21/14 10:59 AM      Result Value Ref Range Status   Specimen Description URINE, SUPRAPUBIC   Final   Special Requests NONE   Final   Culture  Setup Time     Final   Value: 07/21/2014 19:50     Performed at [place] PENDING   Incomplete   Culture     Final   Value: Culture reincubated for better growth     Performed at [facility]   Report Status PENDING   Incomplete    RADIOLOGY STUDIES/RESULTS: Ct Abdomen Pelvis Wo Contrast  07/18/2014   CLINICAL DATA:  Increased weakness. Acute sepsis. Increasing lack  today.  EXAM: CT ABDOMEN AND PELVIS WITHOUT CONTRAST  TECHNIQUE: Multidetector CT imaging of the abdomen and pelvis was performed following the standard protocol without IV contrast.  COMPARISON:  07/21/2011  FINDINGS: Small bilateral pleural effusions with atelectasis or infiltration in both lung bases. Coronary artery and aortic valve calcifications.  The left kidney is significantly enlarged in comparison to the right. There is left hydronephrosis and hydroureter. Stranding around the left kidney in ureter. No ureteral stones are demonstrated but 2 stones are shown in the bladder. There are multiple intrarenal stones in the  left renal pelvis and lower pole right kidney. Additional calcifications in both kidneys are probably vascular. Low-attenuation lesions arising from the upper pole of the left kidney consistent with cysts and similar to previous study.  Surgical absence of the gallbladder. Mild bile duct dilatation. Vague increased density at the area of the distal common bile duct may represent an intraductal stone. The unenhanced appearance of the liver, spleen, pancreas, adrenal glands, inferior vena cava, and retroperitoneal lymph nodes is unremarkable. Vascular calcifications in the abdominal aorta without aneurysm. Stomach, small bowel, and colon are not abnormally distended. There is a broad-based anterior abdominal Backes hernia which contains bowel but without evidence of compromise. No free air or free fluid in the abdomen.  Pelvis: Small amount of free fluid in the pelvis is nonspecific but likely reactive. Foley catheter decompresses the bladder. No evidence of diverticulitis. Appendix is probably surgically absent. Degenerative changes in the lumbar spine. No destructive bone lesions appreciated.  IMPRESSION: Moderate obstruction of the left kidney and ureter. Bladder stones are present, probably representing recently passed stones. No definite distal ureteral stones otherwise identified. Bilateral  intrarenal stones. Stones in the left renal pelvis. Foley catheter in the bladder. Small amount of free fluid in the pelvis.   Electronically Signed   By: Lucienne Capers M.D.   On: 07/18/2014 04:42   Dg Chest Port 1 View  07/18/2014   CLINICAL DATA:  Central line placement.  EXAM: PORTABLE CHEST - 1 VIEW  COMPARISON:  07/18/2014  FINDINGS: The right IJ central venous catheter tip is in the distal SVC approximately 2 cm above the cavoatrial junction. No complicating features. The heart and lungs are stable. No pneumothorax.  IMPRESSION: Right IJ central venous catheter tip is in the distal SVC. No complicating features.   Electronically Signed   By: Kalman Jewels M.D.   On: 07/18/2014 12:04   Dg Chest Port 1 View  07/18/2014   CLINICAL DATA:  Sepsis, shortness of breath, personal history of multiple sclerosis, peripheral vascular disease, TIA, pulmonary fibrosis, hypertension, coronary artery disease  EXAM: PORTABLE CHEST - 1 VIEW  COMPARISON:  Portable exam 0441 hr compared to 07/17/2014  FINDINGS: Normal heart size, mediastinal contours and pulmonary vascularity.  Atherosclerotic calcification aorta.  Mild RIGHT basilar atelectasis.  Atelectasis versus infiltrate developing in LEFT lower lobe.  Remaining lungs clear.  No definite pleural effusion or pneumothorax.  IMPRESSION: Mild RIGHT basilar atelectasis with developing infiltrate versus atelectasis in LEFT lower lobe.   Electronically Signed   By: Lavonia Dana M.D.   On: 07/18/2014 07:48   Dg Chest Port 1 View  07/17/2014   CLINICAL DATA:  Recurrent urinary tract infections.  Fever.  EXAM: PORTABLE CHEST - 1 VIEW  COMPARISON:  November 10, 2011.  FINDINGS: The heart size and mediastinal contours are within normal limits. Both lungs are clear. No pneumothorax or pleural effusion is noted. The visualized skeletal structures are unremarkable.  IMPRESSION: No acute cardiopulmonary abnormality seen.   Electronically Signed   By: Sabino Dick M.D.   On:  07/17/2014 13:48   Dg Abd Portable 1v  07/17/2014   CLINICAL DATA:  73 year old male with acute abdominal pain. Initial encounter.  EXAM: PORTABLE ABDOMEN - 1 VIEW  COMPARISON:  CT Abdomen and Pelvis 07/21/2011.  FINDINGS: Portable AP supine view at 2237 hrs. Calcified atherosclerosis including bilateral renal hilar calcifications. Superimposed nephrolithiasis. Stable cholecystectomy clips. Mild respiratory motion. Non obstructed bowel gas pattern. No acute osseous abnormality identified.  IMPRESSION: 1.  Non obstructed bowel gas pattern. 2. Chronic nephrolithiasis and vascular calcified atherosclerosis.   Electronically Signed   By: Lars Pinks M.D.   On: 07/17/2014 22:55   Dg Swallowing Func-speech Pathology  07/20/2014   Selinda Orion Page, CCC-SLP     07/20/2014  4:15 PM Objective Swallowing Evaluation: Modified barium swallow  evaluation  Patient Details  Name: Jordan Ward MRN: [id number] Date of Birth: [birth year]  Today's Date: 07/20/2014 Time: 1030-1100 SLP Time Calculation (min): 30 min  Past Medical History:  Past Medical History  Diagnosis Date  . Arthritis   . MS (multiple sclerosis)   . Insomnia   . PVD (peripheral vascular disease)   . Carotid artery stenosis   . TIA (transient ischemic attack)   . Chronic indwelling Foley catheter 10/06/2011  . Pulmonary fibrosis 10/06/2011  . Dysphagia 10/07/2011  . Tremors of nervous system 10/08/2011  . Hypothyroidism 10/08/2011  . Pulmonary nodule 10/08/2011  . C. difficile colitis 09/2011  . Pneumonia   . Encephalopathy   . Urinary tract infection   . Tremors of nervous system   . Hypokalemia   . Junctional rhythm   . Anemia   . Hypernatremia   . Sacral ulcer   . HTN (hypertension), malignant 10/06/2011  . Sleep apnea   . Neuromuscular disorder   . Back pain, chronic   . Kidney stones   . High grade dysplasia in colonic adenoma 09/2005  . Gait disorder   . OSA (obstructive sleep apnea)   . Paroxysmal atrial tachycardia   . Dyslipidemia   . Cerebrovascular disease   . CAD  (coronary artery disease)   . Peripheral vascular disease   . Bilateral carotid bruits   . Colon polyps   . HA (headache)    Past Surgical History:  Past Surgical History  Procedure Laterality Date  . Inguinal hernia repair  1971    bilateral  . Back surgery  1976/1979  . Colonoscopy  11/2004    Dr. Sharol Roussel sessile polyp splenic flexure, 73mm sessile  polyp desc colon, tubulovillous adenoma (bx not removed)  . Colonoscopy  01/2005    poor prep, polyp could not be found  . Colonoscopy  05/2005    with EMR, polypectomy Dr. Olegario Messier, bx showed high grade  dysplasia, partially resected  . Colonoscopy  09/2005    Dr. Arsenio Loader, [place] ink tattooing, four villous colon polyp (3  had been missed on previous colonoscopies due to limitations of  procedures  . Colon surgery  09/2005    Fleishman: four tubular adenomas, large adenomatous polyp with  HIGH GRADE dysplasia  . Appendectomy  09/2005    at time of left hemicolectomy  . Colonoscopy  09/2006    normal TI, no polyps  . Colonoscopy  10/2007    Dr. Imogene Burn distal mammillations, benign bx, normal TI,  random bx neg for microscopic colitis  . Cholecystectomy      Dr. Tamala Julian  . Suprapubic catheter insertion     HPI:   73 y.o. male with hx of long-standing MS brought to [facility] ED on  10/19 with slurred speech. Dx of severe sepsis. Pt has a hx of  variable neurogenic dysphagia. Last MBS studies were at [facility] in  Jan and Feb of 2013, the latter of which recommended  nectar-liquids/modified diet secondary to aspiration. Per report,  dysphagia resolved and pt has been eating a regular diet with  thin liquids.  Pt initiated on Dys 3 solids and honey thick  liquids on 10/21 due to overt s/s of aspiration noted at bedside.   MBS ordered 10/22 to objectively determine safest diet  consistencies.       Assessment / Plan / Recommendation Clinical Impression  Dysphagia Diagnosis: Mild pharyngeal phase dysphagia;Moderate  pharyngeal phase dysphagia;Mild oral phase dysphagia Pt presents  with a moderate multifactorial dysphagia with both  sensory and motor impairments.  Pt presents with generalized oral  motor weakness which resulted in oral residue post swallow and  prolonged transit of materials into the oropharynx.  Pt exhibited  a delayed swallow response due to decreased pharyngeal sensation  and overall poor control of boluses which resulted in silent  aspiration of large cup sips of nectar thick liquids.  Attempts  to better control boluses with small cup sips or teaspoons of  nectar thick liquids slightly improved airway protection but  still resulted in silent penetration.  Additional pharyngeal  phase impairments were characterized by decreased base of tongue  retraction, poor tongue to palate contact, and decreased  laryngeal elevation which resulted in inconsistent amounts of  pharyngeal residue across all consistencies.  SLP also suspects  some component of esophageal dysphagia impacting pharyngeal  residue due to pooling of materials at the UES and intermittent  backflow of materials into the cervical esophagus.  The  abovementioned functional impairments appeared to resolve with  increased time between bites/sips Therefore, recommend pt  continue on Dys 3 diet with honey thick liquids, meds crushed in  puree, full supervision for use of the following swallowing  precautions: slow rate, small bites/sips, and alternate solids  and liquids.  Prognosis for advancement good with trials of  advanced consistencies and pharyngeal strengthening exercises.   Recommend repeat MBS to objectively determine readiness for diet  advancement.     Treatment Recommendation  Therapy as outlined in treatment plan below    Diet Recommendation Honey-thick liquid;Dysphagia 3 (Mechanical  Soft)   Liquid Administration via: Cup Medication Administration: Crushed with puree Supervision: Patient able to self feed;Full supervision/cueing  for compensatory strategies Compensations: Slow rate;Small  sips/bites;Follow solids with  liquid Postural Changes and/or Swallow Maneuvers: Seated upright 90  degrees    Other  Recommendations Oral Care Recommendations: Oral care BID   Follow Up Recommendations   (TBD)    Frequency and Duration min 2x/week  1 week   Pertinent Vitals/Pain n/a     General Date of Onset: 07/17/14 Reason for Referral: Objectively evaluate swallowing function Previous Swallow Assessment: 2/13 Diet Prior to this Study: Dysphagia 3 (soft);Honey-thick liquids Respiratory Status: Room air Behavior/Cognition: Alert Oral Cavity - Dentition: Adequate natural dentition Oral Motor / Sensory Function: Impaired - see Bedside swallow  eval Baseline Vocal Quality: Low vocal intensity Volitional Cough: Strong Volitional Swallow: Unable to elicit Anatomy: Within functional limits Pharyngeal Secretions: Not observed secondary MBS    Reason for Referral Objectively evaluate swallowing function   Oral Phase Oral Preparation/Oral Phase Oral Phase: Impaired Oral - Honey Oral - Honey Cup: Reduced posterior propulsion;Delayed oral  transit;Lingual pumping Oral - Nectar Oral - Nectar Teaspoon: Lingual pumping;Reduced posterior  propulsion;Delayed oral transit Oral - Nectar Cup: Lingual pumping;Reduced posterior  propulsion;Delayed oral transit Oral - Solids Oral - Puree: Delayed oral transit;Reduced posterior  propulsion;Lingual pumping Oral - Mechanical Soft: Weak lingual manipulation;Impaired  mastication;Reduced posterior propulsion;Delayed oral  transit;Lingual pumping;Lingual/palatal residue   Pharyngeal Phase Pharyngeal - Honey Pharyngeal - Honey Cup: Delayed swallow initiation;Premature  spillage to valleculae;Pharyngeal residue - valleculae;Reduced  tongue base retraction;Reduced laryngeal elevation;Pharyngeal  residue - pyriform sinuses;Pharyngeal residue - cp segment Pharyngeal - Nectar Pharyngeal - Nectar Teaspoon: Delayed swallow  initiation;Penetration/Aspiration during swallow;Pharyngeal  residue -  pyriform sinuses;Reduced tongue base  retraction;Pharyngeal residue - valleculae;Reduced laryngeal  elevation;Pharyngeal residue - cp segment Penetration/Aspiration details (nectar teaspoon): Material enters  airway, remains ABOVE vocal cords and not ejected out Pharyngeal - Nectar Cup: Delayed swallow initiation;Reduced  laryngeal elevation;Reduced tongue base  retraction;Penetration/Aspiration during swallow;Pharyngeal  residue - pyriform sinuses;Pharyngeal residue - cp  segment;Pharyngeal residue - posterior pharnyx Penetration/Aspiration details (nectar cup): Material enters  airway, passes BELOW cords without attempt by patient to eject  out (silent aspiration);Material enters airway, remains ABOVE  vocal cords and not ejected out Pharyngeal - Solids Pharyngeal - Puree: Delayed swallow initiation;Premature spillage  to valleculae;Pharyngeal residue - valleculae;Pharyngeal residue  - cp segment;Pharyngeal residue - pyriform sinuses;Reduced tongue  base retraction;Reduced laryngeal elevation Pharyngeal - Mechanical Soft: Delayed swallow  initiation;Premature spillage to valleculae;Reduced laryngeal  elevation;Reduced tongue base retraction;Pharyngeal residue -  valleculae;Pharyngeal residue - posterior pharnyx;Pharyngeal  residue - pyriform sinuses;Pharyngeal residue - cp segment  Cervical Esophageal Phase    GO    Cervical Esophageal Phase Cervical Esophageal Phase: Impaired Cervical Esophageal Phase - Comment Cervical Esophageal Comment: intermittently decreased movement of  materials through the UES which is likely related to decreased  hyolaryngeal excursion and increased esophageal pressure with  intermittent backflow into the cervical  esophagus         Page, Selinda Orion 07/20/2014, 4:14 PM     Oren Binet, MD  [facility] Pager:[phone]  If 7PM-7AM, please contact night-coverage [email or web address] Password [email or web address] 07/22/2014, 5:31 PM   LOS: 5 days

## 2014-07-23 DIAGNOSIS — N179 Acute kidney failure, unspecified: Secondary | ICD-10-CM

## 2014-07-23 LAB — CBC
HCT: 33.3 % — ABNORMAL LOW (ref 39.0–52.0)
Hemoglobin: 10.3 g/dL — ABNORMAL LOW (ref 13.0–17.0)
MCH: 29.3 pg (ref 26.0–34.0)
MCHC: 30.9 g/dL (ref 30.0–36.0)
MCV: 94.6 fL (ref 78.0–100.0)
Platelets: 111 10*3/uL — ABNORMAL LOW (ref 150–400)
RBC: 3.52 MIL/uL — AB (ref 4.22–5.81)
RDW: 16.2 % — ABNORMAL HIGH (ref 11.5–15.5)
WBC: 17.3 10*3/uL — ABNORMAL HIGH (ref 4.0–10.5)

## 2014-07-23 LAB — BASIC METABOLIC PANEL
Anion gap: 11 (ref 5–15)
BUN: 42 mg/dL — ABNORMAL HIGH (ref 6–23)
CO2: 24 meq/L (ref 19–32)
CREATININE: 2.09 mg/dL — AB (ref 0.50–1.35)
Calcium: 8 mg/dL — ABNORMAL LOW (ref 8.4–10.5)
Chloride: 124 mEq/L — ABNORMAL HIGH (ref 96–112)
GFR calc Af Amer: 35 mL/min — ABNORMAL LOW (ref >90)
GFR, EST NON AFRICAN AMERICAN: 30 mL/min — AB (ref >90)
Glucose, Bld: 114 mg/dL — ABNORMAL HIGH (ref 70–99)
Potassium: 3.9 mEq/L (ref 3.7–5.3)
Sodium: 159 mEq/L — ABNORMAL HIGH (ref 137–147)

## 2014-07-23 MED ORDER — DEXTROSE 5 % IV SOLN
INTRAVENOUS | Status: AC
  Administered 2014-07-23: 09:00:00 via INTRAVENOUS

## 2014-07-23 MED ORDER — LOPERAMIDE HCL 2 MG PO CAPS: 2.0000 mg | ORAL_CAPSULE | ORAL | Status: DC | PRN

## 2014-07-23 NOTE — Progress Notes (Signed)
CSW Intern informed p/t that there are currently no bed offers from [facility] and asked if p/t would like to expand search to include other counties. P/t refused, stating that any other county would be too far of a drive for p/t's wife and would like to wait on [place] bed offers. Unit CSW will f/u.

## 2014-07-23 NOTE — Progress Notes (Signed)
SUBJECTIVE: Denies chest pain, palpitations, and shortness of breath. Has some abdominal pain, depending upon what he eats.     Intake/Output Summary (Last 24 hours) at 07/23/14 0834 Last data filed at 07/23/14 0533  Gross per 24 hour  Intake    120 ml  Output   1400 ml  Net  -1280 ml    Current Facility-Administered Medications  Medication Dose Route Frequency Provider Last Rate Last Dose  . 0.9 %  sodium chloride infusion  250 mL Intravenous PRN Rahul P Desai, PA-C      . acetaminophen (TYLENOL) suppository 650 mg  650 mg Rectal Q6H PRN Colbert Coyer, MD   650 mg at 07/18/14 0008  . amLODipine (NORVASC) tablet 5 mg  5 mg Oral Daily Wilhelmina Mcardle, MD   5 mg at 07/22/14 0951  . aspirin chewable tablet 81 mg  81 mg Oral Daily Wilhelmina Mcardle, MD   81 mg at 07/22/14 [id number]  . baclofen (LIORESAL) tablet 10 mg  10 mg Oral TID PRN Jonetta Osgood, MD      . carvedilol (COREG) tablet 6.25 mg  6.25 mg Oral BID WC Sueanne Margarita, MD   6.25 mg at 07/22/14 0851  . collagenase (SANTYL) ointment   Topical Daily Collene Gobble, MD      . dextrose 5 % solution   Intravenous Continuous Shanker Kristeen Mans, MD      . diazepam (VALIUM) tablet 5 mg  5 mg Oral BID Jonetta Osgood, MD   5 mg at 07/22/14 2305  . feeding supplement (ENSURE) (ENSURE) pudding 1 Container  1 Container Oral TID BM Dalene Carrow, RD   1 Container at 07/22/14 2306  . hydrALAZINE (APRESOLINE) tablet 10 mg  10 mg Oral 3 times per day Ritta Slot, NP   10 mg at 07/23/14 0559  . imipenem-cilastatin (PRIMAXIN) 250 mg in sodium chloride 0.9 % 100 mL IVPB  250 mg Intravenous Q8H Darnell Level Mancheril, RPH   250 mg at 07/23/14 0559  . ipratropium-albuterol (DUONEB) 0.5-2.5 (3) MG/3ML nebulizer solution 3 mL  3 mL Nebulization Q4H PRN Collene Gobble, MD   3 mL at 07/18/14 0304  . isosorbide mononitrate (IMDUR) 24 hr tablet 30 mg  30 mg Oral Daily Sueanne Margarita, MD   30 mg at 07/22/14 0951  . levothyroxine  (SYNTHROID, LEVOTHROID) tablet 50 mcg  50 mcg Oral QAC breakfast Rahul P Desai, PA-C   50 mcg at 07/22/14 0836  . RESOURCE THICKENUP CLEAR   Oral PRN Wilhelmina Mcardle, MD      . saccharomyces boulardii [facility]) capsule 250 mg  250 mg Oral BID Bernadene Bell, MD   250 mg at 07/22/14 2305    Filed Vitals:   07/22/14 1831 07/22/14 2113 07/22/14 2302 07/23/14 0532  BP: 158/61 99/62 150/68 106/72  Pulse:  70 72 68  Temp:  98 F (36.7 C)  97.7 F (36.5 C)  TempSrc:  Oral  Oral  Resp:  20  18  Height:      Weight:      SpO2: 100% 91%  100%    PHYSICAL EXAM General: NAD, flat affect.  HEENT: Normal.  Neck: No JVD, no thyromegaly.  Lungs: Clear to auscultation bilaterally with normal respiratory effort.  CV: Nondisplaced PMI. Regular rate and rhythm, normal S1/S2, no S3/S4, no murmur. No pretibial edema.  Abdomen: +tenderness.  Neurologic: Alert and oriented.  Psych: Flat affect.  Musculoskeletal: No gross deformities.  Extremities: No clubbing or cyanosis.   TELEMETRY: Reviewed telemetry pt in sinus rhythm.  LABS: Basic Metabolic Panel:  Recent Labs  07/22/14 0430 07/23/14 0609  NA 155* 159*  K 4.0 3.9  CL 124* 124*  CO2 19 24  GLUCOSE 94 114*  BUN 50* 42*  CREATININE 2.30* 2.09*  CALCIUM 8.1* 8.0*  MG 1.9  --    Liver Function Tests: No results found for this basename: AST, ALT, ALKPHOS, BILITOT, PROT, ALBUMIN,  in the last 72 hours No results found for this basename: LIPASE, AMYLASE,  in the last 72 hours CBC:  Recent Labs  07/21/14 0518 07/22/14 0430 07/23/14 0609  WBC 18.4* 14.0* 17.3*  NEUTROABS 13.9*  --   --   HGB 11.5* 10.4* 10.3*  HCT 34.4* 32.1* 33.3*  MCV 86.6 89.9 94.6  PLT 59* 74* 111*   Cardiac Enzymes:  Recent Labs  07/22/14 1400  TROPONINI 0.72*   BNP: No components found with this basename: POCBNP,  D-Dimer: No results found for this basename: DDIMER,  in the last 72 hours Hemoglobin A1C: No results found for this basename:  HGBA1C,  in the last 72 hours Fasting Lipid Panel: No results found for this basename: CHOL, HDL, LDLCALC, TRIG, CHOLHDL, LDLDIRECT,  in the last 72 hours Thyroid Function Tests: No results found for this basename: TSH, T4TOTAL, FREET3, T3FREE, THYROIDAB,  in the last 72 hours Anemia Panel: No results found for this basename: VITAMINB12, FOLATE, FERRITIN, TIBC, IRON, RETICCTPCT,  in the last 72 hours     ASSESSMENT AND PLAN: 1. Elevated troponin in the context of urosepsis/demand ischemia: Given development of precordial/anterior T wave inversions on 10/24, suspicious that he may have LAD lesion. Troponins peaked at 4.93 on 10/20, down to 0.72 on 10/24. EF 30-35%. Denies chest pain. Continue ASA and Coreg. Given recent elevation of transaminases, will hold off on addition of statin. No cath due to acute renal failure/urosepsis due to high risk of contrast-induced nephropathy. Would proceed with nuclear MPI study once troponins have leveled off, perhaps early this week. 2. Ischemic cardiomyopathy, EF 30-35%: No evidence of heart failure at present. Continue Coreg, Imdur, and hydralazine for optimization of intracardiac hemodynamics. No ACEI/ARB due to renal failure.  3. Urosepsis: On Primaxin. WBC count elevated from yesterday.  4. Essential HTN: On amlodipine. BP stable. Specifically, not hypotensive in context of urosepsis.  Dispo: Consider nuclear MPI study early this week.   Kate Sable, M.D., F.A.C.C.

## 2014-07-23 NOTE — Progress Notes (Signed)
Pharmacist Heart Failure Core Measure Documentation  Assessment: Jordan Ward has an EF documented as 30-35% on 07/18/2014 by echo.  Rationale: Heart failure patients with left ventricular systolic dysfunction (LVSD) and an EF < 40% should be prescribed an angiotensin converting enzyme inhibitor (ACEI) or angiotensin receptor blocker (ARB) at discharge unless a contraindication is documented in the medical record.  This patient is not currently on an ACEI or ARB for HF.  This note is being placed in the record in order to provide documentation that a contraindication to the use of these agents is present for this encounter.  ACE Inhibitor or Angiotensin Receptor Blocker is contraindicated (specify all that apply)  []   ACEI allergy AND ARB allergy []   Angioedema []   Moderate or severe aortic stenosis []   Hyperkalemia []   Hypotension []   Renal artery stenosis [x]   Worsening renal function, preexisting renal disease or dysfunction  Levaeh Vice L. Nicole Kindred, PharmD Clinical Pharmacy Resident Pager: [phone] 07/23/2014 1:10 PM

## 2014-07-23 NOTE — Progress Notes (Addendum)
PATIENT DETAILS Name: Jordan Ward Age: 73 y.o. Sex: male Date of Birth: [birth year] Admit Date: 07/17/2014 Admitting Physician Collene Gobble, MD [id number], BENJAMIN, PA-C  Brief Summary: 73 y.o. Male admitted with septic shock. Managed in the ICU, required Vasopressors.Urine and Blood culture positive for Pseudomonas.  Transferred to [facility] on 10/24.  Subjective: No major issues overnight.  Assessment/Plan: Active Problems: Septic Shock:Resolved. Secondary to Pseudomonal UTI/bacteremia. Pseudomonal UTI/Bacteremia:source likely UTI. Continue with IV Invanz, will plan on 2 week course from 07/20/14. NSTEMI/Demand Ischemia:given decreased EF, Cards planning on ischemic eval once renal failure gets better. On ASA and Coreg. Ischemic cardiomyopathy, EF 30-35%:compensated. Continue Coreg, Imdur, and hydralazine.No ACEI/ARB due to renal failure. [id number] secondary ATN, obstructive uropathy. Suprapubic catheter in place. Creatinine slowly improving.Follow lytes Hypernatremia:secondary to poor oral intake, change IVF to D5W. Given low EF, monitor carefully. Encourage oral intake.Follow  Acute Encephalopathy/Slurred speech:resolved.Neuro initially consulted, may be due to acute sepsis & associated renal failure. No clear evidence of acute exacerbation of MS at this point. Diarrhea:C Diff PCR negative. Continue Probiotic.Suspect may be related to antibiotics.Start prn Imodium, try to remove rectal tube. Thrombocytopenia:secondary to sepsis. HIT panel negative. Improving. [id number] with Imdur, Amlodipine,Coreg and Hydralazine Dysphagia:secondary to MS, on Dys 3 diet. SLP following Hx of [id number], no evidence of exacerbation.Resume Valium and prn Baclofen Deconditioning:secondary to critical illness, underlying MS. SNF on discharge Hx of Pul Fibrosis:stable, titrate off O2. Prn Nebs. Hypothyriodism:c/w Levothyroxine Hx of chronic left sided hydronephroses/renal calculi:Urology  following, no additional intervention is needed per Urology.  Disposition: Remain inpatient-SNF on discharge  Antibiotics:  Primaxin 10/22>>  Tressie Ellis 10/20>>10/22   Rocephin 10/19>>10/20  Flagyl 10/19>10/20  Vanco 10/19>>10/20  Zosyn 1019>>10/19  DVT Prophylaxis:  SCD's  Code Status: Full code   Family Communication None at bedside  Procedures:  None  CONSULTS:  pulmonary/intensive care, neurology and urology  MEDICATIONS: Scheduled Meds: . amLODipine  5 mg Oral Daily  . aspirin  81 mg Oral Daily  . carvedilol  6.25 mg Oral BID WC  . collagenase   Topical Daily  . diazepam  5 mg Oral BID  . feeding supplement (ENSURE)  1 Container Oral TID BM  . hydrALAZINE  10 mg Oral 3 times per day  . imipenem-cilastatin  250 mg Intravenous Q8H  . isosorbide mononitrate  30 mg Oral Daily  . levothyroxine  50 mcg Oral QAC breakfast  . saccharomyces boulardii  250 mg Oral BID   Continuous Infusions: . dextrose 50 mL/hr at 07/23/14 0837   PRN Meds:.sodium chloride, acetaminophen, baclofen, ipratropium-albuterol, RESOURCE THICKENUP CLEAR  Antibiotics: Anti-infectives   Start     Dose/Rate Route Frequency Ordered Stop   07/20/14 1000  imipenem-cilastatin (PRIMAXIN) 250 mg in sodium chloride 0.9 % 100 mL IVPB     250 mg 200 mL/hr over 30 Minutes Intravenous Every 8 hours 07/20/14 0932     07/20/14 0930  imipenem-cilastatin (PRIMAXIN) 500 mg in sodium chloride 0.9 % 100 mL IVPB  Status:  Discontinued     500 mg 200 mL/hr over 30 Minutes Intravenous 3 times per day 07/20/14 0929 07/20/14 0931   07/18/14 0900  cefTAZidime (FORTAZ) 1 g in dextrose 5 % 50 mL IVPB  Status:  Discontinued     1 g 100 mL/hr over 30 Minutes Intravenous Every 24 hours 07/18/14 0831 07/20/14 0929   07/18/14 0000  vancomycin (VANCOCIN) 50 mg/mL oral solution 500 mg  Status:  Discontinued  500 mg Oral 4 times per day 07/17/14 2149 07/18/14 0949   07/17/14 2200  metroNIDAZOLE (FLAGYL) IVPB 500 mg   Status:  Discontinued     500 mg 100 mL/hr over 60 Minutes Intravenous Every 8 hours 07/17/14 2149 07/18/14 0949   07/17/14 1515  vancomycin (VANCOCIN) IVPB 1000 mg/200 mL premix     1,000 mg 200 mL/hr over 60 Minutes Intravenous  Once 07/17/14 1508 07/17/14 1652   07/17/14 1515  piperacillin-tazobactam (ZOSYN) IVPB 3.375 g     3.375 g 100 mL/hr over 30 Minutes Intravenous  Once 07/17/14 1508 07/17/14 1620   07/17/14 1400  cefTRIAXone (ROCEPHIN) 2 g in dextrose 5 % 50 mL IVPB  Status:  Discontinued     2 g 100 mL/hr over 30 Minutes Intravenous Every 24 hours 07/17/14 1339 07/18/14 0829   07/17/14 1315  cefTRIAXone (ROCEPHIN) 2 g in dextrose 5 % 50 mL IVPB  Status:  Discontinued     2 g 100 mL/hr over 30 Minutes Intravenous  Once 07/17/14 1313 07/17/14 1420       PHYSICAL EXAM: Vital signs in last 24 hours: Filed Vitals:   07/22/14 2302 07/23/14 0532 07/23/14 0837 07/23/14 0948  BP: 150/68 106/72 164/67 116/80  Pulse: 72 68 71   Temp:  97.7 F (36.5 C)    TempSrc:  Oral    Resp:  18    Height:      Weight:      SpO2:  100%      Weight change:  Filed Weights   07/18/14 0500 07/19/14 0500 07/20/14 0500  Weight: 62 kg (136 lb 11 oz) 63.5 kg (139 lb 15.9 oz) 61.6 kg (135 lb 12.9 oz)   Body mass index is 21.93 kg/(m^2).   Gen Exam: Awake and alert with clear speech.   Neck: Supple, No JVD.   Chest: B/L Clear.   CVS: S1 S2 Regular, no murmurs.  Abdomen: soft, BS +, non tender, non distended.  Extremities: no edema, lower extremities warm to touch. Neurologic: Non Focal-but with gen. weakness Skin: No Rash.   Wounds: N/A.    Intake/Output from previous day:  Intake/Output Summary (Last 24 hours) at 07/23/14 1049 Last data filed at 07/23/14 0859  Gross per 24 hour  Intake    640 ml  Output   1400 ml  Net   -760 ml     LAB RESULTS: CBC  Recent Labs Lab 07/17/14 1121  07/18/14 0739 07/19/14 0216 07/20/14 0501 07/21/14 0518 07/22/14 0430 07/23/14 0609    WBC 23.0*  < > 19.7* 20.7* 22.4* 18.4* 14.0* 17.3*  HGB 12.7*  < > 9.3* 9.4* 10.0* 11.5* 10.4* 10.3*  HCT 38.2*  < > 28.7* 28.8* 29.3* 34.4* 32.1* 33.3*  PLT 164  < > 82* 62* 57* 59* 74* 111*  MCV 91.2  < > 92.6 91.7 86.4 86.6 89.9 94.6  MCH 30.3  < > 30.0 29.9 29.5 29.0 29.1 29.3  MCHC 33.2  < > 32.4 32.6 34.1 33.4 32.4 30.9  RDW 14.6  < > 15.5 15.6* 15.1 15.0 15.6* 16.2*  LYMPHSABS 1.0  --  0.8 1.0 1.7 2.6  --   --   MONOABS 0.7  --  0.4 0.6 0.7 1.5*  --   --   EOSABS 0.0  --  0.0 0.2 0.1 0.2  --   --   BASOSABS 0.0  --  0.0 0.0 0.1 0.2*  --   --   < > = values  in this interval not displayed.  Chemistries   Recent Labs Lab 07/18/14 0739 07/20/14 0501 07/20/14 1000 07/21/14 0518 07/22/14 0430 07/23/14 0609  NA 146 146 145 145 155* 159*  K 3.7 2.9* 2.8* 3.5* 4.0 3.9  CL 114* 113* 111 115* 124* 124*  CO2 15* 19 19 16* 19 24  GLUCOSE 145* 115* 103* 98 94 114*  BUN 55* 53* 52* 51* 50* 42*  CREATININE 3.88* 3.10* 2.85* 2.50* 2.30* 2.09*  CALCIUM 7.0* 7.8* 8.0* 8.0* 8.1* 8.0*  MG 1.5  --   --   --  1.9  --     CBG:  Recent Labs Lab 07/19/14 2357 07/20/14 0829 07/20/14 1612 07/20/14 1840 07/20/14 2207  GLUCAP 134* 84 67* 141* 82    GFR Estimated Creatinine Clearance: 28.2 ml/min (by C-G formula based on Cr of 2.09).  Coagulation profile  Recent Labs Lab 07/17/14 1533 07/18/14 0119 07/18/14 0739  INR 1.61* 1.96* 1.68*    Cardiac Enzymes  Recent Labs Lab 07/18/14 0739 07/20/14 0501 07/22/14 1400  TROPONINI 4.93* 3.41* 0.72*    No components found with this basename: POCBNP,  No results found for this basename: DDIMER,  in the last 72 hours No results found for this basename: HGBA1C,  in the last 72 hours No results found for this basename: CHOL, HDL, LDLCALC, TRIG, CHOLHDL, LDLDIRECT,  in the last 72 hours No results found for this basename: TSH, T4TOTAL, FREET3, T3FREE, THYROIDAB,  in the last 72 hours No results found for this basename:  VITAMINB12, FOLATE, FERRITIN, TIBC, IRON, RETICCTPCT,  in the last 72 hours No results found for this basename: LIPASE, AMYLASE,  in the last 72 hours  Urine Studies No results found for this basename: UACOL, UAPR, USPG, UPH, UTP, UGL, UKET, UBIL, UHGB, UNIT, UROB, ULEU, UEPI, UWBC, URBC, UBAC, CAST, CRYS, UCOM, BILUA,  in the last 72 hours  MICROBIOLOGY: Recent Results (from the past 240 hour(s))  CULTURE, BLOOD (ROUTINE X 2)     Status: None   Collection Time    07/17/14 11:29 AM      Result Value Ref Range Status   Specimen Description BLOOD RIGHT HAND   Final   Special Requests BOTTLES DRAWN AEROBIC AND ANAEROBIC [place]   Final   Culture  Setup Time     Final   Value: 07/18/2014 14:01     Performed at [facility]   Culture     Final   Value: PSEUDOMONAS AERUGINOSA     Note: Gram Stain Report Called to,Read Back By and Verified With: [facility] A [id number] 07/18/14 AT [place] BY Mikel Cella K Performed at [facility]     Performed at [facility]   Report Status 07/21/2014 FINAL   Final   Organism ID, Bacteria PSEUDOMONAS AERUGINOSA   Final  CULTURE, BLOOD (ROUTINE X 2)     Status: None   Collection Time    07/17/14 11:35 AM      Result Value Ref Range Status   Specimen Description BLOOD LEFT HAND   Final   Special Requests BOTTLES DRAWN AEROBIC AND ANAEROBIC 8CC   Final   Culture  Setup Time     Final   Value: 07/18/2014 14:01     Performed at [facility]   Culture     Final   Value: PSEUDOMONAS AERUGINOSA     Note: SUSCEPTIBILITIES PERFORMED ON PREVIOUS CULTURE WITHIN THE LAST 5 DAYS.     Note: Gram Stain Report  Called to,Read Back By and Verified With: [facility] A [id number] 07/18/14 AT [place] BY Mikel Cella K Performed at [facility]     Performed at [facility]   Report Status 07/20/2014 FINAL   Final  URINE CULTURE     Status: None   Collection Time    07/17/14 12:13 PM      Result Value Ref Range Status   Specimen Description URINE,  CATHETERIZED   Final   Special Requests NONE   Final   Culture  Setup Time     Final   Value: 07/17/2014 12:56     Performed at [place]     Final   Value: >=100,000 COLONIES/ML     Performed at [facility]   Culture     Final   Value: Multiple bacterial morphotypes present, none predominant. Suggest appropriate recollection if clinically indicated.     Performed at [facility]   Report Status 07/20/2014 FINAL   Final  MRSA PCR SCREENING     Status: Abnormal   Collection Time    07/17/14  8:03 PM      Result Value Ref Range Status   MRSA by PCR POSITIVE (*) NEGATIVE Final   Comment:            The GeneXpert MRSA Assay (FDA     approved for NASAL specimens     only), is one component of a     comprehensive MRSA colonization     surveillance program. It is not     intended to diagnose MRSA     infection nor to guide or     monitor treatment for     MRSA infections.     RESULT CALLED TO, READ BACK BY AND VERIFIED WITH:     JAMES,L RN [id number] 07/17/14 MITCHELL,L  CLOSTRIDIUM DIFFICILE BY PCR     Status: None   Collection Time    07/17/14  9:23 PM      Result Value Ref Range Status   C difficile by pcr NEGATIVE  NEGATIVE Final  URINE CULTURE     Status: None   Collection Time    07/21/14 10:59 AM      Result Value Ref Range Status   Specimen Description URINE, SUPRAPUBIC   Final   Special Requests NONE   Final   Culture  Setup Time     Final   Value: 07/21/2014 19:50     Performed at [place] PENDING   Incomplete   Culture     Final   Value: Culture reincubated for better growth     Performed at [facility]   Report Status PENDING   Incomplete    RADIOLOGY STUDIES/RESULTS: Ct Abdomen Pelvis Wo Contrast  07/18/2014   CLINICAL DATA:  Increased weakness. Acute sepsis. Increasing lack today.  EXAM: CT ABDOMEN AND PELVIS WITHOUT CONTRAST  TECHNIQUE: Multidetector CT imaging of the abdomen and  pelvis was performed following the standard protocol without IV contrast.  COMPARISON:  07/21/2011  FINDINGS: Small bilateral pleural effusions with atelectasis or infiltration in both lung bases. Coronary artery and aortic valve calcifications.  The left kidney is significantly enlarged in comparison to the right. There is left hydronephrosis and hydroureter. Stranding around the left kidney in ureter. No ureteral stones are demonstrated but 2 stones are shown in the bladder. There are multiple intrarenal stones in the left renal pelvis and  lower pole right kidney. Additional calcifications in both kidneys are probably vascular. Low-attenuation lesions arising from the upper pole of the left kidney consistent with cysts and similar to previous study.  Surgical absence of the gallbladder. Mild bile duct dilatation. Vague increased density at the area of the distal common bile duct may represent an intraductal stone. The unenhanced appearance of the liver, spleen, pancreas, adrenal glands, inferior vena cava, and retroperitoneal lymph nodes is unremarkable. Vascular calcifications in the abdominal aorta without aneurysm. Stomach, small bowel, and colon are not abnormally distended. There is a broad-based anterior abdominal Mcclafferty hernia which contains bowel but without evidence of compromise. No free air or free fluid in the abdomen.  Pelvis: Small amount of free fluid in the pelvis is nonspecific but likely reactive. Foley catheter decompresses the bladder. No evidence of diverticulitis. Appendix is probably surgically absent. Degenerative changes in the lumbar spine. No destructive bone lesions appreciated.  IMPRESSION: Moderate obstruction of the left kidney and ureter. Bladder stones are present, probably representing recently passed stones. No definite distal ureteral stones otherwise identified. Bilateral intrarenal stones. Stones in the left renal pelvis. Foley catheter in the bladder. Small amount of free fluid  in the pelvis.   Electronically Signed   By: Lucienne Capers M.D.   On: 07/18/2014 04:42   Dg Chest Port 1 View  07/18/2014   CLINICAL DATA:  Central line placement.  EXAM: PORTABLE CHEST - 1 VIEW  COMPARISON:  07/18/2014  FINDINGS: The right IJ central venous catheter tip is in the distal SVC approximately 2 cm above the cavoatrial junction. No complicating features. The heart and lungs are stable. No pneumothorax.  IMPRESSION: Right IJ central venous catheter tip is in the distal SVC. No complicating features.   Electronically Signed   By: Kalman Jewels M.D.   On: 07/18/2014 12:04   Dg Chest Port 1 View  07/18/2014   CLINICAL DATA:  Sepsis, shortness of breath, personal history of multiple sclerosis, peripheral vascular disease, TIA, pulmonary fibrosis, hypertension, coronary artery disease  EXAM: PORTABLE CHEST - 1 VIEW  COMPARISON:  Portable exam 0441 hr compared to 07/17/2014  FINDINGS: Normal heart size, mediastinal contours and pulmonary vascularity.  Atherosclerotic calcification aorta.  Mild RIGHT basilar atelectasis.  Atelectasis versus infiltrate developing in LEFT lower lobe.  Remaining lungs clear.  No definite pleural effusion or pneumothorax.  IMPRESSION: Mild RIGHT basilar atelectasis with developing infiltrate versus atelectasis in LEFT lower lobe.   Electronically Signed   By: Lavonia Dana M.D.   On: 07/18/2014 07:48   Dg Chest Port 1 View  07/17/2014   CLINICAL DATA:  Recurrent urinary tract infections.  Fever.  EXAM: PORTABLE CHEST - 1 VIEW  COMPARISON:  November 10, 2011.  FINDINGS: The heart size and mediastinal contours are within normal limits. Both lungs are clear. No pneumothorax or pleural effusion is noted. The visualized skeletal structures are unremarkable.  IMPRESSION: No acute cardiopulmonary abnormality seen.   Electronically Signed   By: Sabino Dick M.D.   On: 07/17/2014 13:48   Dg Abd Portable 1v  07/17/2014   CLINICAL DATA:  73 year old male with acute abdominal  pain. Initial encounter.  EXAM: PORTABLE ABDOMEN - 1 VIEW  COMPARISON:  CT Abdomen and Pelvis 07/21/2011.  FINDINGS: Portable AP supine view at 2237 hrs. Calcified atherosclerosis including bilateral renal hilar calcifications. Superimposed nephrolithiasis. Stable cholecystectomy clips. Mild respiratory motion. Non obstructed bowel gas pattern. No acute osseous abnormality identified.  IMPRESSION: 1.  Non obstructed bowel gas  pattern. 2. Chronic nephrolithiasis and vascular calcified atherosclerosis.   Electronically Signed   By: Lars Pinks M.D.   On: 07/17/2014 22:55   Dg Swallowing Func-speech Pathology  07/20/2014   Selinda Orion Page, CCC-SLP     07/20/2014  4:15 PM Objective Swallowing Evaluation: Modified barium swallow  evaluation  Patient Details  Name: Jordan Ward MRN: [id number] Date of Birth: 08-Oct-1940  Today's Date: 07/20/2014 Time: 1030-1100 SLP Time Calculation (min): 30 min  Past Medical History:  Past Medical History  Diagnosis Date  . Arthritis   . MS (multiple sclerosis)   . Insomnia   . PVD (peripheral vascular disease)   . Carotid artery stenosis   . TIA (transient ischemic attack)   . Chronic indwelling Foley catheter 10/06/2011  . Pulmonary fibrosis 10/06/2011  . Dysphagia 10/07/2011  . Tremors of nervous system 10/08/2011  . Hypothyroidism 10/08/2011  . Pulmonary nodule 10/08/2011  . C. difficile colitis 09/2011  . Pneumonia   . Encephalopathy   . Urinary tract infection   . Tremors of nervous system   . Hypokalemia   . Junctional rhythm   . Anemia   . Hypernatremia   . Sacral ulcer   . HTN (hypertension), malignant 10/06/2011  . Sleep apnea   . Neuromuscular disorder   . Back pain, chronic   . Kidney stones   . High grade dysplasia in colonic adenoma 09/2005  . Gait disorder   . OSA (obstructive sleep apnea)   . Paroxysmal atrial tachycardia   . Dyslipidemia   . Cerebrovascular disease   . CAD (coronary artery disease)   . Peripheral vascular disease   . Bilateral carotid bruits   . Colon polyps   . HA  (headache)    Past Surgical History:  Past Surgical History  Procedure Laterality Date  . Inguinal hernia repair  1971    bilateral  . Back surgery  1976/1979  . Colonoscopy  11/2004    Dr. Sharol Roussel sessile polyp splenic flexure, 5mm sessile  polyp desc colon, tubulovillous adenoma (bx not removed)  . Colonoscopy  01/2005    poor prep, polyp could not be found  . Colonoscopy  05/2005    with EMR, polypectomy Dr. Olegario Messier, bx showed high grade  dysplasia, partially resected  . Colonoscopy  09/2005    Dr. Arsenio Loader, [place] ink tattooing, four villous colon polyp (3  had been missed on previous colonoscopies due to limitations of  procedures  . Colon surgery  09/2005    Fleishman: four tubular adenomas, large adenomatous polyp with  HIGH GRADE dysplasia  . Appendectomy  09/2005    at time of left hemicolectomy  . Colonoscopy  09/2006    normal TI, no polyps  . Colonoscopy  10/2007    Dr. Imogene Burn distal mammillations, benign bx, normal TI,  random bx neg for microscopic colitis  . Cholecystectomy      Dr. Tamala Julian  . Suprapubic catheter insertion     HPI:   73 y.o. male with hx of long-standing MS brought to [facility] ED on  10/19 with slurred speech. Dx of severe sepsis. Pt has a hx of  variable neurogenic dysphagia. Last MBS studies were at [facility] in  Jan and Feb of 2013, the latter of which recommended  nectar-liquids/modified diet secondary to aspiration. Per report,  dysphagia resolved and pt has been eating a regular diet with  thin liquids.  Pt initiated on Dys 3 solids and honey thick  liquids on 10/21 due  to overt s/s of aspiration noted at bedside.   MBS ordered 10/22 to objectively determine safest diet  consistencies.       Assessment / Plan / Recommendation Clinical Impression  Dysphagia Diagnosis: Mild pharyngeal phase dysphagia;Moderate  pharyngeal phase dysphagia;Mild oral phase dysphagia Pt presents with a moderate multifactorial dysphagia with both  sensory and motor impairments.  Pt presents with  generalized oral  motor weakness which resulted in oral residue post swallow and  prolonged transit of materials into the oropharynx.  Pt exhibited  a delayed swallow response due to decreased pharyngeal sensation  and overall poor control of boluses which resulted in silent  aspiration of large cup sips of nectar thick liquids.  Attempts  to better control boluses with small cup sips or teaspoons of  nectar thick liquids slightly improved airway protection but  still resulted in silent penetration.  Additional pharyngeal  phase impairments were characterized by decreased base of tongue  retraction, poor tongue to palate contact, and decreased  laryngeal elevation which resulted in inconsistent amounts of  pharyngeal residue across all consistencies.  SLP also suspects  some component of esophageal dysphagia impacting pharyngeal  residue due to pooling of materials at the UES and intermittent  backflow of materials into the cervical esophagus.  The  abovementioned functional impairments appeared to resolve with  increased time between bites/sips Therefore, recommend pt  continue on Dys 3 diet with honey thick liquids, meds crushed in  puree, full supervision for use of the following swallowing  precautions: slow rate, small bites/sips, and alternate solids  and liquids.  Prognosis for advancement good with trials of  advanced consistencies and pharyngeal strengthening exercises.   Recommend repeat MBS to objectively determine readiness for diet  advancement.     Treatment Recommendation  Therapy as outlined in treatment plan below    Diet Recommendation Honey-thick liquid;Dysphagia 3 (Mechanical  Soft)   Liquid Administration via: Cup Medication Administration: Crushed with puree Supervision: Patient able to self feed;Full supervision/cueing  for compensatory strategies Compensations: Slow rate;Small sips/bites;Follow solids with  liquid Postural Changes and/or Swallow Maneuvers: Seated upright 90  degrees    Other   Recommendations Oral Care Recommendations: Oral care BID   Follow Up Recommendations   (TBD)    Frequency and Duration min 2x/week  1 week   Pertinent Vitals/Pain n/a     General Date of Onset: 07/17/14 Reason for Referral: Objectively evaluate swallowing function Previous Swallow Assessment: 2/13 Diet Prior to this Study: Dysphagia 3 (soft);Honey-thick liquids Respiratory Status: Room air Behavior/Cognition: Alert Oral Cavity - Dentition: Adequate natural dentition Oral Motor / Sensory Function: Impaired - see Bedside swallow  eval Baseline Vocal Quality: Low vocal intensity Volitional Cough: Strong Volitional Swallow: Unable to elicit Anatomy: Within functional limits Pharyngeal Secretions: Not observed secondary MBS    Reason for Referral Objectively evaluate swallowing function   Oral Phase Oral Preparation/Oral Phase Oral Phase: Impaired Oral - Honey Oral - Honey Cup: Reduced posterior propulsion;Delayed oral  transit;Lingual pumping Oral - Nectar Oral - Nectar Teaspoon: Lingual pumping;Reduced posterior  propulsion;Delayed oral transit Oral - Nectar Cup: Lingual pumping;Reduced posterior  propulsion;Delayed oral transit Oral - Solids Oral - Puree: Delayed oral transit;Reduced posterior  propulsion;Lingual pumping Oral - Mechanical Soft: Weak lingual manipulation;Impaired  mastication;Reduced posterior propulsion;Delayed oral  transit;Lingual pumping;Lingual/palatal residue   Pharyngeal Phase Pharyngeal - Honey Pharyngeal - Honey Cup: Delayed swallow initiation;Premature  spillage to valleculae;Pharyngeal residue - valleculae;Reduced  tongue base retraction;Reduced laryngeal elevation;Pharyngeal  residue - pyriform sinuses;Pharyngeal  residue - cp segment Pharyngeal - Nectar Pharyngeal - Nectar Teaspoon: Delayed swallow  initiation;Penetration/Aspiration during swallow;Pharyngeal  residue - pyriform sinuses;Reduced tongue base  retraction;Pharyngeal residue - valleculae;Reduced laryngeal  elevation;Pharyngeal  residue - cp segment Penetration/Aspiration details (nectar teaspoon): Material enters  airway, remains ABOVE vocal cords and not ejected out Pharyngeal - Nectar Cup: Delayed swallow initiation;Reduced  laryngeal elevation;Reduced tongue base  retraction;Penetration/Aspiration during swallow;Pharyngeal  residue - pyriform sinuses;Pharyngeal residue - cp  segment;Pharyngeal residue - posterior pharnyx Penetration/Aspiration details (nectar cup): Material enters  airway, passes BELOW cords without attempt by patient to eject  out (silent aspiration);Material enters airway, remains ABOVE  vocal cords and not ejected out Pharyngeal - Solids Pharyngeal - Puree: Delayed swallow initiation;Premature spillage  to valleculae;Pharyngeal residue - valleculae;Pharyngeal residue  - cp segment;Pharyngeal residue - pyriform sinuses;Reduced tongue  base retraction;Reduced laryngeal elevation Pharyngeal - Mechanical Soft: Delayed swallow  initiation;Premature spillage to valleculae;Reduced laryngeal  elevation;Reduced tongue base retraction;Pharyngeal residue -  valleculae;Pharyngeal residue - posterior pharnyx;Pharyngeal  residue - pyriform sinuses;Pharyngeal residue - cp segment  Cervical Esophageal Phase    GO    Cervical Esophageal Phase Cervical Esophageal Phase: Impaired Cervical Esophageal Phase - Comment Cervical Esophageal Comment: intermittently decreased movement of  materials through the UES which is likely related to decreased  hyolaryngeal excursion and increased esophageal pressure with  intermittent backflow into the cervical  esophagus         Page, Selinda Orion 07/20/2014, 4:14 PM     Oren Binet, MD  [facility] Pager:[phone]  If 7PM-7AM, please contact night-coverage [email or web address] Password [email or web address] 07/23/2014, 10:49 AM   LOS: 6 days

## 2014-07-24 DIAGNOSIS — E870 Hyperosmolality and hypernatremia: Secondary | ICD-10-CM

## 2014-07-24 DIAGNOSIS — I255 Ischemic cardiomyopathy: Secondary | ICD-10-CM

## 2014-07-24 DIAGNOSIS — I248 Other forms of acute ischemic heart disease: Secondary | ICD-10-CM

## 2014-07-24 LAB — BASIC METABOLIC PANEL WITH GFR
Anion gap: 10 (ref 5–15)
BUN: 29 mg/dL — ABNORMAL HIGH (ref 6–23)
CO2: 25 meq/L (ref 19–32)
Calcium: 7.8 mg/dL — ABNORMAL LOW (ref 8.4–10.5)
Chloride: 123 meq/L — ABNORMAL HIGH (ref 96–112)
Creatinine, Ser: 1.87 mg/dL — ABNORMAL HIGH (ref 0.50–1.35)
GFR calc Af Amer: 40 mL/min — ABNORMAL LOW
GFR calc non Af Amer: 35 mL/min — ABNORMAL LOW
Glucose, Bld: 106 mg/dL — ABNORMAL HIGH (ref 70–99)
Potassium: 3.3 meq/L — ABNORMAL LOW (ref 3.7–5.3)
Sodium: 158 meq/L — ABNORMAL HIGH (ref 137–147)

## 2014-07-24 MED ORDER — BLISTEX MEDICATED EX OINT
TOPICAL_OINTMENT | CUTANEOUS | Status: DC | PRN
  Administered 2014-07-24: 1 via TOPICAL
  Filled 2014-07-24: qty 10

## 2014-07-24 MED ORDER — POTASSIUM CHLORIDE 2 MEQ/ML IV SOLN
INTRAVENOUS | Status: DC
  Administered 2014-07-24 – 2014-07-26 (×3): via INTRAVENOUS
  Filled 2014-07-24 (×5): qty 1000

## 2014-07-24 NOTE — Progress Notes (Signed)
Speech Language Pathology Treatment: Dysphagia  Patient Details Name: Jordan Ward MRN: [id number] DOB: 1941-02-05 Today's Date: 07/24/2014 Time: 1415-1430 SLP Time Calculation (min): 15 min  Assessment / Plan / Recommendation Clinical Impression  Skilled treatment session focused on addressing dysphagia goals; patient consuming lunch with nurse tech at bedside upon SLP entering room.  SLP took over full supervision of meal and facilitated session with Mod verbal and visual cues to recall and utilize recommended safe swallow strategies.  Patient demonstrated significant oral residue/pocketing with Dys 3 textures and required consistent cues to alternate solids and liquids to manage pocketing.  Patient demonstrated intermittent throat clears x2 during completion of lunch, but appears to be tolerating current diet overall with full supervision.  SLP also facilitated session with re-education regarding previously taught pharyngeal strengthening exercises and patient required Mod cues to perform x10.     HPI HPI: 73 y.o. male with hx of long-standing MS brought to [facility] ED on 10/19 with slurred speech. Had a fall the day prior but refused transport to ED.  Dx of severe sepsis. Pt has a hx of variable neurogenic dysphagia.  Last MBS studies were at [facility] in Jan and Feb of 2013, the latter of which recommended nectar-liquids/modified diet secondary to aspiration.  Per wife, dysphagia resolved and pt has been eating a regular diet with thin liquids. MBS 10/22 revealed decompensated swallow with  generalized weakness and penetration /aspiration of thin and nectar liquids.  Dys 3 with honey-thick liquids recommended.     Pertinent Vitals Pain Assessment: No/denies pain  SLP Plan  Continue with current plan of care    Recommendations Diet recommendations: Dysphagia 3 (mechanical soft);Honey-thick liquid Liquids provided via: Cup;No straw Medication Administration: Crushed with puree Supervision: Patient able to  self feed;Full supervision/cueing for compensatory strategies Compensations: Slow rate;Small sips/bites;Follow solids with liquid Postural Changes and/or Swallow Maneuvers: Seated upright 90 degrees              Oral Care Recommendations: Oral care BID Follow up Recommendations: Other (comment) (TBD) Plan: Continue with current plan of care    GO    Carmelia Roller., CCC-SLP [id number]  [place] 07/24/2014, 3:05 PM

## 2014-07-24 NOTE — Progress Notes (Signed)
NUTRITION FOLLOW UP  Intervention:   -Continue Ensure Pudding po TID, each supplement provides 170 kcal and 4 grams of protein  Nutrition Dx:   Increased nutrient needs related to multiple wounds as evidenced by estimated calorie and protein needs.   Goal:   Intake to meet >90% of estimated nutrition needs  Monitor:   Swallowing function, PO intake, labs, weight trend.  Assessment:   73 y.o. M brought to [facility] ED on 10/19 with slurred speech. Had a fall the day prior but refused transport to ED. On 10/19, had some slurring of speech and was complaining of abdominal pain in area of supra-pubic catheter. He was apparently diagnosed with UTI 3 weeks ago but has had trouble taking his meds (Cipro) because of size of pills. In ED, he was found to have UTI with severe sepsis and remained somewhat hypotensive after 3L IVF. Transferred to [facility] ICU on 10/19.  Pt reports continued poor appetite, complaining of early satiety. Meal completion remains poor; PO: 10-30%. He continues to endorse difficulty swallowing, particularly liquids.  Pt reports he is accepting Ensure Pudding supplement. He reveals he is not "too crazy" about it. Offered other supplements on the formulary, but pt would like to continue with Ensure Pudding. He refused offer of Prostat supplement. Discussed importance of good PO intake, particularly protein containing foods, to support with recovery and wound healing process.  Discharge disposition is for SNF. RNCM and CSW following.  Labs reviewed. Na: 158. K: 3.3, Cl: 123, BUN/Creat: 29/1.87, Calcium: 7.8, Glucose: 106.   Height: Ht Readings from Last 1 Encounters:  07/17/14 5\' 6"  (1.676 m)    Weight Status:   Wt Readings from Last 1 Encounters:  07/20/14 135 lb 12.9 oz (61.6 kg)  07/20/14  135 lb 12.9 oz (61.6 kg)   Re-estimated needs:  Kcal: 2100-2300 Protein: 92-102 grams Fluid: 2.1-2.3 L  Skin: stage 3 pressure ulcer to ankle, stage 1 pressure ulcer to left buttocks, stage  3 pressure ulcer to right buttocks  Diet Order: Dysphagia 3 with honey thick liquids   Intake/Output Summary (Last 24 hours) at 07/24/14 1000 Last data filed at 07/24/14 0930  Gross per 24 hour  Intake 1423.17 ml  Output   1100 ml  Net 323.17 ml    Last BM: 07/24/14   Labs:   Recent Labs Lab 07/18/14 0119 07/18/14 0739  07/22/14 0430 07/23/14 0609 07/24/14 0420  NA 149* 146  < > 155* 159* 158*  K 3.6* 3.7  < > 4.0 3.9 3.3*  CL 114* 114*  < > 124* 124* 123*  CO2 16* 15*  < > 19 24 25   BUN 50* 55*  < > 50* 42* 29*  CREATININE 3.91* 3.88*  < > 2.30* 2.09* 1.87*  CALCIUM 7.4* 7.0*  < > 8.1* 8.0* 7.8*  MG  --  1.5  --  1.9  --   --   PHOS  --  3.1  --   --   --   --   GLUCOSE 119* 145*  < > 94 114* 106*  < > = values in this interval not displayed.  CBG (last 3)  No results found for this basename: GLUCAP,  in the last 72 hours  Scheduled Meds: . amLODipine  5 mg Oral Daily  . aspirin  81 mg Oral Daily  . carvedilol  6.25 mg Oral BID WC  . collagenase   Topical Daily  . diazepam  5 mg Oral BID  . feeding  supplement (ENSURE)  1 Container Oral TID BM  . hydrALAZINE  10 mg Oral 3 times per day  . imipenem-cilastatin  250 mg Intravenous Q8H  . isosorbide mononitrate  30 mg Oral Daily  . levothyroxine  50 mcg Oral QAC breakfast  . saccharomyces boulardii  250 mg Oral BID    Continuous Infusions: . dextrose 5 % 1,000 mL with potassium chloride 20 mEq infusion 75 mL/hr at 07/24/14 0837    Lakeysha Slutsky A. Jimmye Norman, RD, LDN Pager: [phone] After hours Pager: [phone]

## 2014-07-24 NOTE — Progress Notes (Signed)
ANTIBIOTIC CONSULT NOTE - FOLLOW UP  Pharmacy Consult for Imipenem Indication: Pseudomonas bacteremia and UTI  Allergies  Allergen Reactions  . Tetracyclines & Related Anaphylaxis and Rash  . Ciprofloxacin     Trouble swallowing    Patient Measurements: Height: 5\' 6"  (167.6 cm) Weight: 135 lb 12.9 oz (61.6 kg) IBW/kg (Calculated) : 63.8  Vital Signs: Temp: 98 F (36.7 C) (10/26 1330) Temp Source: Oral (10/26 1330) BP: 116/72 mmHg (10/26 1330) Pulse Rate: 68 (10/26 1330) Intake/Output from previous day: 10/25 0701 - 10/26 0700 In: 1603.2 [P.O.:1084; I.V.:519.2] Out: 1100 [Urine:1100]  Labs:  Recent Labs  07/22/14 0430 07/23/14 0609 07/24/14 0420  WBC 14.0* 17.3*  --   HGB 10.4* 10.3*  --   PLT 74* 111*  --   CREATININE 2.30* 2.09* 1.87*   Estimated Creatinine Clearance: 31.6 ml/min (by C-G formula based on Cr of 1.87).  Microbiology: Recent Results (from the past 720 hour(s))  CULTURE, BLOOD (ROUTINE X 2)     Status: None   Collection Time    07/17/14 11:29 AM      Result Value Ref Range Status   Specimen Description BLOOD RIGHT HAND   Final   Special Requests BOTTLES DRAWN AEROBIC AND ANAEROBIC 12CC   Final   Culture  Setup Time     Final   Value: 07/18/2014 14:01     Performed at [facility]   Culture     Final   Value: PSEUDOMONAS AERUGINOSA     Note: Gram Stain Report Called to,Read Back By and Verified With: [facility] A 0442 07/18/14 AT [place] Performed at [facility]     Performed at [facility]   Report Status 07/21/2014 FINAL   Final   Organism ID, Bacteria PSEUDOMONAS AERUGINOSA   Final  CULTURE, BLOOD (ROUTINE X 2)     Status: None   Collection Time    07/17/14 11:35 AM      Result Value Ref Range Status   Specimen Description BLOOD LEFT HAND   Final   Special Requests BOTTLES DRAWN AEROBIC AND ANAEROBIC 8CC   Final   Culture  Setup Time     Final   Value: 07/18/2014 14:01     Performed at [facility]   Culture     Final   Value: PSEUDOMONAS AERUGINOSA     Note: SUSCEPTIBILITIES PERFORMED ON PREVIOUS CULTURE WITHIN THE LAST 5 DAYS.     Note: Gram Stain Report Called to,Read Back By and Verified With: [facility] A 0552 07/18/14 AT [place] BY Mikel Cella K Performed at [facility]     Performed at [facility]   Report Status 07/20/2014 FINAL   Final  URINE CULTURE     Status: None   Collection Time    07/17/14 12:13 PM      Result Value Ref Range Status   Specimen Description URINE, CATHETERIZED   Final   Special Requests NONE   Final   Culture  Setup Time     Final   Value: 07/17/2014 12:56     Performed at [place]     Final   Value: >=100,000 COLONIES/ML     Performed at [facility]   Culture     Final   Value: Multiple bacterial morphotypes present, none predominant. Suggest appropriate recollection if clinically indicated.     Performed at [facility]   Report Status  07/20/2014 FINAL   Final  MRSA PCR SCREENING     Status: Abnormal   Collection Time    07/17/14  8:03 PM      Result Value Ref Range Status   MRSA by PCR POSITIVE (*) NEGATIVE Final   Comment:            The GeneXpert MRSA Assay (FDA     approved for NASAL specimens     only), is one component of a     comprehensive MRSA colonization     surveillance program. It is not     intended to diagnose MRSA     infection nor to guide or     monitor treatment for     MRSA infections.     RESULT CALLED TO, READ BACK BY AND VERIFIED WITH:     JAMES,L RN [id number] 07/17/14 MITCHELL,L  CLOSTRIDIUM DIFFICILE BY PCR     Status: None   Collection Time    07/17/14  9:23 PM      Result Value Ref Range Status   C difficile by pcr NEGATIVE  NEGATIVE Final  URINE CULTURE     Status: None   Collection Time    07/21/14 10:59 AM      Result Value Ref Range Status   Specimen Description URINE, SUPRAPUBIC   Final   Special Requests NONE   Final   Culture  Setup  Time     Final   Value: 07/21/2014 19:50     Performed at [place]     Final   Value: 30,000 COLONIES/ML     Performed at [facility]   Culture     Final   Value: [place]     Performed at [facility]   Report Status PENDING   Incomplete  CULTURE, BLOOD (ROUTINE X 2)     Status: None   Collection Time    07/22/14 10:40 AM      Result Value Ref Range Status   Specimen Description BLOOD RIGHT ARM   Final   Special Requests BOTTLES DRAWN AEROBIC ONLY 3CC   Final   Culture  Setup Time     Final   Value: 07/22/2014 18:04     Performed at [facility]   Culture     Final   Value:        BLOOD CULTURE RECEIVED NO GROWTH TO DATE CULTURE WILL BE HELD FOR 5 DAYS BEFORE ISSUING A FINAL NEGATIVE REPORT     Performed at [facility]   Report Status PENDING   Incomplete  CULTURE, BLOOD (ROUTINE X 2)     Status: None   Collection Time    07/22/14 10:50 AM      Result Value Ref Range Status   Specimen Description BLOOD RIGHT HAND   Final   Special Requests BOTTLES DRAWN AEROBIC ONLY 2CC   Final   Culture  Setup Time     Final   Value: 07/22/2014 18:04     Performed at [facility]   Culture     Final   Value:        BLOOD CULTURE RECEIVED NO GROWTH TO DATE CULTURE WILL BE HELD FOR 5 DAYS BEFORE ISSUING A FINAL NEGATIVE REPORT     Performed at [facility]   Report Status PENDING   Incomplete   Assessment:   Day # 5 Imipenem.  Renal function improving slowly,  but Imipenem dose remains appropriate.  Planning 2 weeks of therapy.    Goal of Therapy:  appropriate Imipenem dose for renal function and infection  Plan:   Continue Primaxin 250 mg IV q8hrs.  Follow renal function.  If change to Invanz at discharge, 1 gram IV q24hrs would be appropriate for crcl >30 ml/min.    Arty Baumgartner, [place] Pager: [phone] 07/24/2014,1:32 PM

## 2014-07-24 NOTE — Progress Notes (Signed)
Physical Therapy Treatment Patient Details Name: Jordan Ward MRN: [id number] DOB: 08-01-1941 Today's Date: 07/24/2014    History of Present Illness Pt admitted to [facility] on 10/19 with slurred speech and abdominal pain.  Pt with sepsis due to UTI, hypotensive.  PMH:  MS, HTN, CAD, NSTEMI, c diff.    PT Comments    Patient progressing well with mobility. Gait training limited today due to symptomatic orthostatic hypotension resulting in need to sit. Vitals listed below. Improving in ability to stand from EOB however continues to have difficulty with bed mobility. Due to improvement in overall mobility, pt only requires 1 person assist for safety. Demonstrates sufficient UE strength as pt able to clear bottom by pushing through BUEs on any surface. Highly motivated.    Follow Up Recommendations  CIR     Equipment Recommendations  Other (comment) (TBA.)    Recommendations for Other Services Rehab consult     Precautions / Restrictions Precautions Precautions: Fall Restrictions Weight Bearing Restrictions: No    Mobility  Bed Mobility Overal bed mobility: Needs Assistance Bed Mobility: Rolling;Sidelying to Sit Rolling: Min guard Sidelying to sit: Mod assist;HOB elevated       General bed mobility comments: HOB flat, use of bed rails. Vc's for sequencing to perform transfer.  Transfers Overall transfer level: Needs assistance Equipment used: Rolling walker (2 wheeled) Transfers: Sit to/from Stand Sit to Stand: Min assist         General transfer comment: Min A to rise and for steadying upon standing. VC's for hand placement. Stood x4 from EOB.  Ambulation/Gait Ambulation/Gait assistance: Min assist Ambulation Distance (Feet): 4 Feet Assistive device: Rolling walker (2 wheeled) Gait Pattern/deviations: Step-to pattern;Decreased stride length;Trunk flexed;Narrow base of support     General Gait Details: Pt initially required cues to take shorter steps and Min  A to negotiate RW as pt with tendency to bring BLEs too far anterior to RW. (+) dizziness during gait. Ambulated 4' forward/backward. Sway noted. Took a few steps to chair. Increased knee unsteadiness noted.   Stairs            Wheelchair Mobility    Modified Rankin (Stroke Patients Only)       Balance Overall balance assessment: Needs assistance;History of Falls Sitting-balance support: Feet supported;Single extremity supported Sitting balance-Leahy Scale: Fair     Standing balance support: During functional activity Standing balance-Leahy Scale: Poor Standing balance comment: Requires BUE support on RW for balance/safety. Tolerated standing with min A-Min guard assist due to dizziness affecting balance at times. Able to stand for short period with 1 UE support but requiring Min A for stability.                    Cognition Arousal/Alertness: Awake/alert Behavior During Therapy: WFL for tasks assessed/performed Overall Cognitive Status: No family/caregiver present to determine baseline cognitive functioning Area of Impairment: Orientation (Oriented to time and place and vaguely situation.)                    Exercises General Exercises - Lower Extremity Ankle Circles/Pumps: Both;10 reps;Seated Long Arc Quad: Both;10 reps;Seated    General Comments General comments (skin integrity, edema, etc.): BP in seated position 123/75 with HR 78 bpm, BP in standing 100/68, HR 87 bpm. Symptomatic upon standing, worsened with ambulation.      Pertinent Vitals/Pain Pain Assessment: No/denies pain    Home Living  Prior Function            PT Goals (current goals can now be found in the care plan section) Progress towards PT goals: Progressing toward goals    Frequency  Min 3X/week    PT Plan Current plan remains appropriate    Co-evaluation             End of Session Equipment Utilized During Treatment: Gait  belt Activity Tolerance: Other (comment) (Limited due to orthostatic hypotension.) Patient left: in chair;with call bell/phone within reach;with family/visitor present     Time: 1055-1130 PT Time Calculation (min): 35 min  Charges:  $Gait Training: 8-22 mins $Therapeutic Activity: 8-22 mins                    G CodesCandy Sledge A 2014-08-18, 12:02 PM Candy Sledge, [place], DPT [phone]

## 2014-07-24 NOTE — Clinical Social Work Note (Signed)
Wife informed of bed offers. Patient and wife have accepted bed offer from [facility].  Liz Beach MSW, [place], [place], [phone]

## 2014-07-24 NOTE — Progress Notes (Signed)
SUBJECTIVE: Denies chest pain or SOB  OBJECTIVE:   Vitals:   Filed Vitals:   07/23/14 1400 07/23/14 1819 07/23/14 2215 07/24/14 0535  BP: 124/52 139/57 101/82 106/61  Pulse: 70 70 78 67  Temp: 98.2 F (36.8 C)  98.3 F (36.8 C) 98.2 F (36.8 C)  TempSrc: Oral  Oral Oral  Resp: 20  20 20   Height:      Weight:      SpO2: 100%  100% 100%   I&O's:   Intake/Output Summary (Last 24 hours) at 07/24/14 [id number] Last data filed at 07/23/14 2311  Gross per 24 hour  Intake 963.17 ml  Output   1100 ml  Net -136.83 ml   TELEMETRY: Reviewed telemetry pt in NSR:     PHYSICAL EXAM General: Well developed, well nourished, in no acute distress Head: Eyes PERRLA, No xanthomas.   Normal cephalic and atramatic  Lungs:   Clear bilaterally to auscultation and percussion. Heart:   HRRR S1 S2 Pulses are 2+ & equal. Abdomen: Bowel sounds are positive, abdomen soft and non-tender without masses  Extremities:   No clubbing, cyanosis or edema.  DP +1 Neuro: Alert and oriented X 3. Psych:  Good affect, responds appropriately   LABS: Basic Metabolic Panel:  Recent Labs  07/22/14 0430 07/23/14 0609 07/24/14 0420  NA 155* 159* 158*  K 4.0 3.9 3.3*  CL 124* 124* 123*  CO2 19 24 25   GLUCOSE 94 114* 106*  BUN 50* 42* 29*  CREATININE 2.30* 2.09* 1.87*  CALCIUM 8.1* 8.0* 7.8*  MG 1.9  --   --    Liver Function Tests: No results found for this basename: AST, ALT, ALKPHOS, BILITOT, PROT, ALBUMIN,  in the last 72 hours No results found for this basename: LIPASE, AMYLASE,  in the last 72 hours CBC:  Recent Labs  07/22/14 0430 07/23/14 0609  WBC 14.0* 17.3*  HGB 10.4* 10.3*  HCT 32.1* 33.3*  MCV 89.9 94.6  PLT 74* 111*   Cardiac Enzymes:  Recent Labs  07/22/14 1400  TROPONINI 0.72*   BNP: No components found with this basename: POCBNP,  D-Dimer: No results found for this basename: DDIMER,  in the last 72 hours Hemoglobin A1C: No results found for this basename: HGBA1C,  in  the last 72 hours Fasting Lipid Panel: No results found for this basename: CHOL, HDL, LDLCALC, TRIG, CHOLHDL, LDLDIRECT,  in the last 72 hours Thyroid Function Tests: No results found for this basename: TSH, T4TOTAL, FREET3, T3FREE, THYROIDAB,  in the last 72 hours Anemia Panel: No results found for this basename: VITAMINB12, FOLATE, FERRITIN, TIBC, IRON, RETICCTPCT,  in the last 72 hours Coag Panel:   Lab Results  Component Value Date   INR 1.68* 07/18/2014   INR 1.96* 07/18/2014   INR 1.61* 07/17/2014    RADIOLOGY: Ct Abdomen Pelvis Wo Contrast  07/18/2014   CLINICAL DATA:  Increased weakness. Acute sepsis. Increasing lack today.  EXAM: CT ABDOMEN AND PELVIS WITHOUT CONTRAST  TECHNIQUE: Multidetector CT imaging of the abdomen and pelvis was performed following the standard protocol without IV contrast.  COMPARISON:  07/21/2011  FINDINGS: Small bilateral pleural effusions with atelectasis or infiltration in both lung bases. Coronary artery and aortic valve calcifications.  The left kidney is significantly enlarged in comparison to the right. There is left hydronephrosis and hydroureter. Stranding around the left kidney in ureter. No ureteral stones are demonstrated but 2 stones are shown in the bladder. There are multiple intrarenal stones in  the left renal pelvis and lower pole right kidney. Additional calcifications in both kidneys are probably vascular. Low-attenuation lesions arising from the upper pole of the left kidney consistent with cysts and similar to previous study.  Surgical absence of the gallbladder. Mild bile duct dilatation. Vague increased density at the area of the distal common bile duct may represent an intraductal stone. The unenhanced appearance of the liver, spleen, pancreas, adrenal glands, inferior vena cava, and retroperitoneal lymph nodes is unremarkable. Vascular calcifications in the abdominal aorta without aneurysm. Stomach, small bowel, and colon are not abnormally  distended. There is a broad-based anterior abdominal Robenson hernia which contains bowel but without evidence of compromise. No free air or free fluid in the abdomen.  Pelvis: Small amount of free fluid in the pelvis is nonspecific but likely reactive. Foley catheter decompresses the bladder. No evidence of diverticulitis. Appendix is probably surgically absent. Degenerative changes in the lumbar spine. No destructive bone lesions appreciated.  IMPRESSION: Moderate obstruction of the left kidney and ureter. Bladder stones are present, probably representing recently passed stones. No definite distal ureteral stones otherwise identified. Bilateral intrarenal stones. Stones in the left renal pelvis. Foley catheter in the bladder. Small amount of free fluid in the pelvis.   Electronically Signed   By: Lucienne Capers M.D.   On: 07/18/2014 04:42   Dg Chest Port 1 View  07/18/2014   CLINICAL DATA:  Central line placement.  EXAM: PORTABLE CHEST - 1 VIEW  COMPARISON:  07/18/2014  FINDINGS: The right IJ central venous catheter tip is in the distal SVC approximately 2 cm above the cavoatrial junction. No complicating features. The heart and lungs are stable. No pneumothorax.  IMPRESSION: Right IJ central venous catheter tip is in the distal SVC. No complicating features.   Electronically Signed   By: Kalman Jewels M.D.   On: 07/18/2014 12:04   Dg Chest Port 1 View  07/18/2014   CLINICAL DATA:  Sepsis, shortness of breath, personal history of multiple sclerosis, peripheral vascular disease, TIA, pulmonary fibrosis, hypertension, coronary artery disease  EXAM: PORTABLE CHEST - 1 VIEW  COMPARISON:  Portable exam 0441 hr compared to 07/17/2014  FINDINGS: Normal heart size, mediastinal contours and pulmonary vascularity.  Atherosclerotic calcification aorta.  Mild RIGHT basilar atelectasis.  Atelectasis versus infiltrate developing in LEFT lower lobe.  Remaining lungs clear.  No definite pleural effusion or pneumothorax.   IMPRESSION: Mild RIGHT basilar atelectasis with developing infiltrate versus atelectasis in LEFT lower lobe.   Electronically Signed   By: Lavonia Dana M.D.   On: 07/18/2014 07:48   Dg Chest Port 1 View  07/17/2014   CLINICAL DATA:  Recurrent urinary tract infections.  Fever.  EXAM: PORTABLE CHEST - 1 VIEW  COMPARISON:  November 10, 2011.  FINDINGS: The heart size and mediastinal contours are within normal limits. Both lungs are clear. No pneumothorax or pleural effusion is noted. The visualized skeletal structures are unremarkable.  IMPRESSION: No acute cardiopulmonary abnormality seen.   Electronically Signed   By: Sabino Dick M.D.   On: 07/17/2014 13:48   Dg Abd Portable 1v  07/17/2014   CLINICAL DATA:  73 year old male with acute abdominal pain. Initial encounter.  EXAM: PORTABLE ABDOMEN - 1 VIEW  COMPARISON:  CT Abdomen and Pelvis 07/21/2011.  FINDINGS: Portable AP supine view at 2237 hrs. Calcified atherosclerosis including bilateral renal hilar calcifications. Superimposed nephrolithiasis. Stable cholecystectomy clips. Mild respiratory motion. Non obstructed bowel gas pattern. No acute osseous abnormality identified.  IMPRESSION: 1.  Non obstructed bowel gas pattern. 2. Chronic nephrolithiasis and vascular calcified atherosclerosis.   Electronically Signed   By: Lars Pinks M.D.   On: 07/17/2014 22:55   Dg Swallowing Func-speech Pathology  07/20/2014   Selinda Orion Page, CCC-SLP     07/20/2014  4:15 PM Objective Swallowing Evaluation: Modified barium swallow  evaluation  Patient Details  Name: Jordan Ward MRN: [id number] Date of Birth: 1941/08/27  Today's Date: 07/20/2014 Time: 1030-1100 SLP Time Calculation (min): 30 min  Past Medical History:  Past Medical History  Diagnosis Date  . Arthritis   . MS (multiple sclerosis)   . Insomnia   . PVD (peripheral vascular disease)   . Carotid artery stenosis   . TIA (transient ischemic attack)   . Chronic indwelling Foley catheter 10/06/2011  . Pulmonary fibrosis  10/06/2011  . Dysphagia 10/07/2011  . Tremors of nervous system 10/08/2011  . Hypothyroidism 10/08/2011  . Pulmonary nodule 10/08/2011  . C. difficile colitis 09/2011  . Pneumonia   . Encephalopathy   . Urinary tract infection   . Tremors of nervous system   . Hypokalemia   . Junctional rhythm   . Anemia   . Hypernatremia   . Sacral ulcer   . HTN (hypertension), malignant 10/06/2011  . Sleep apnea   . Neuromuscular disorder   . Back pain, chronic   . Kidney stones   . High grade dysplasia in colonic adenoma 09/2005  . Gait disorder   . OSA (obstructive sleep apnea)   . Paroxysmal atrial tachycardia   . Dyslipidemia   . Cerebrovascular disease   . CAD (coronary artery disease)   . Peripheral vascular disease   . Bilateral carotid bruits   . Colon polyps   . HA (headache)    Past Surgical History:  Past Surgical History  Procedure Laterality Date  . Inguinal hernia repair  1971    bilateral  . Back surgery  1976/1979  . Colonoscopy  11/2004    Dr. Sharol Roussel sessile polyp splenic flexure, 92mm sessile  polyp desc colon, tubulovillous adenoma (bx not removed)  . Colonoscopy  01/2005    poor prep, polyp could not be found  . Colonoscopy  05/2005    with EMR, polypectomy Dr. Olegario Messier, bx showed high grade  dysplasia, partially resected  . Colonoscopy  09/2005    Dr. Arsenio Loader, [place] ink tattooing, four villous colon polyp (3  had been missed on previous colonoscopies due to limitations of  procedures  . Colon surgery  09/2005    Fleishman: four tubular adenomas, large adenomatous polyp with  HIGH GRADE dysplasia  . Appendectomy  09/2005    at time of left hemicolectomy  . Colonoscopy  09/2006    normal TI, no polyps  . Colonoscopy  10/2007    Dr. Imogene Burn distal mammillations, benign bx, normal TI,  random bx neg for microscopic colitis  . Cholecystectomy      Dr. Tamala Julian  . Suprapubic catheter insertion     HPI:   73 y.o. male with hx of long-standing MS brought to [facility] on  10/19 with slurred speech. Dx of severe sepsis. Pt has a  hx of  variable neurogenic dysphagia. Last MBS studies were at [facility] in  Jan and Feb of 2013, the latter of which recommended  nectar-liquids/modified diet secondary to aspiration. Per report,  dysphagia resolved and pt has been eating a regular diet with  thin liquids.  Pt initiated on Dys 3 solids and honey thick  liquids on 10/21 due to overt s/s of aspiration noted at bedside.   MBS ordered 10/22 to objectively determine safest diet  consistencies.       Assessment / Plan / Recommendation Clinical Impression  Dysphagia Diagnosis: Mild pharyngeal phase dysphagia;Moderate  pharyngeal phase dysphagia;Mild oral phase dysphagia Pt presents with a moderate multifactorial dysphagia with both  sensory and motor impairments.  Pt presents with generalized oral  motor weakness which resulted in oral residue post swallow and  prolonged transit of materials into the oropharynx.  Pt exhibited  a delayed swallow response due to decreased pharyngeal sensation  and overall poor control of boluses which resulted in silent  aspiration of large cup sips of nectar thick liquids.  Attempts  to better control boluses with small cup sips or teaspoons of  nectar thick liquids slightly improved airway protection but  still resulted in silent penetration.  Additional pharyngeal  phase impairments were characterized by decreased base of tongue  retraction, poor tongue to palate contact, and decreased  laryngeal elevation which resulted in inconsistent amounts of  pharyngeal residue across all consistencies.  SLP also suspects  some component of esophageal dysphagia impacting pharyngeal  residue due to pooling of materials at the UES and intermittent  backflow of materials into the cervical esophagus.  The  abovementioned functional impairments appeared to resolve with  increased time between bites/sips Therefore, recommend pt  continue on Dys 3 diet with honey thick liquids, meds crushed in  puree, full supervision for use of the following  swallowing  precautions: slow rate, small bites/sips, and alternate solids  and liquids.  Prognosis for advancement good with trials of  advanced consistencies and pharyngeal strengthening exercises.   Recommend repeat MBS to objectively determine readiness for diet  advancement.     Treatment Recommendation  Therapy as outlined in treatment plan below    Diet Recommendation Honey-thick liquid;Dysphagia 3 (Mechanical  Soft)   Liquid Administration via: Cup Medication Administration: Crushed with puree Supervision: Patient able to self feed;Full supervision/cueing  for compensatory strategies Compensations: Slow rate;Small sips/bites;Follow solids with  liquid Postural Changes and/or Swallow Maneuvers: Seated upright 90  degrees    Other  Recommendations Oral Care Recommendations: Oral care BID   Follow Up Recommendations   (TBD)    Frequency and Duration min 2x/week  1 week   Pertinent Vitals/Pain n/a     General Date of Onset: 07/17/14 Reason for Referral: Objectively evaluate swallowing function Previous Swallow Assessment: 2/13 Diet Prior to this Study: Dysphagia 3 (soft);Honey-thick liquids Respiratory Status: Room air Behavior/Cognition: Alert Oral Cavity - Dentition: Adequate natural dentition Oral Motor / Sensory Function: Impaired - see Bedside swallow  eval Baseline Vocal Quality: Low vocal intensity Volitional Cough: Strong Volitional Swallow: Unable to elicit Anatomy: Within functional limits Pharyngeal Secretions: Not observed secondary MBS    Reason for Referral Objectively evaluate swallowing function   Oral Phase Oral Preparation/Oral Phase Oral Phase: Impaired Oral - Honey Oral - Honey Cup: Reduced posterior propulsion;Delayed oral  transit;Lingual pumping Oral - Nectar Oral - Nectar Teaspoon: Lingual pumping;Reduced posterior  propulsion;Delayed oral transit Oral - Nectar Cup: Lingual pumping;Reduced posterior  propulsion;Delayed oral transit Oral - Solids Oral - Puree: Delayed oral transit;Reduced  posterior  propulsion;Lingual pumping Oral - Mechanical Soft: Weak lingual manipulation;Impaired  mastication;Reduced posterior propulsion;Delayed oral  transit;Lingual pumping;Lingual/palatal residue   Pharyngeal Phase Pharyngeal - Honey Pharyngeal - Honey Cup: Delayed swallow initiation;Premature  spillage to valleculae;Pharyngeal residue - valleculae;Reduced  tongue base retraction;Reduced laryngeal elevation;Pharyngeal  residue - pyriform sinuses;Pharyngeal residue - cp segment Pharyngeal - Nectar Pharyngeal - Nectar Teaspoon: Delayed swallow  initiation;Penetration/Aspiration during swallow;Pharyngeal  residue - pyriform sinuses;Reduced tongue base  retraction;Pharyngeal residue - valleculae;Reduced laryngeal  elevation;Pharyngeal residue - cp segment Penetration/Aspiration details (nectar teaspoon): Material enters  airway, remains ABOVE vocal cords and not ejected out Pharyngeal - Nectar Cup: Delayed swallow initiation;Reduced  laryngeal elevation;Reduced tongue base  retraction;Penetration/Aspiration during swallow;Pharyngeal  residue - pyriform sinuses;Pharyngeal residue - cp  segment;Pharyngeal residue - posterior pharnyx Penetration/Aspiration details (nectar cup): Material enters  airway, passes BELOW cords without attempt by patient to eject  out (silent aspiration);Material enters airway, remains ABOVE  vocal cords and not ejected out Pharyngeal - Solids Pharyngeal - Puree: Delayed swallow initiation;Premature spillage  to valleculae;Pharyngeal residue - valleculae;Pharyngeal residue  - cp segment;Pharyngeal residue - pyriform sinuses;Reduced tongue  base retraction;Reduced laryngeal elevation Pharyngeal - Mechanical Soft: Delayed swallow  initiation;Premature spillage to valleculae;Reduced laryngeal  elevation;Reduced tongue base retraction;Pharyngeal residue -  valleculae;Pharyngeal residue - posterior pharnyx;Pharyngeal  residue - pyriform sinuses;Pharyngeal residue - cp segment  Cervical Esophageal  Phase    GO    Cervical Esophageal Phase Cervical Esophageal Phase: Impaired Cervical Esophageal Phase - Comment Cervical Esophageal Comment: intermittently decreased movement of  materials through the UES which is likely related to decreased  hyolaryngeal excursion and increased esophageal pressure with  intermittent backflow into the cervical  esophagus         Page, Selinda Orion 07/20/2014, 4:14 PM    ASSESSMENT AND PLAN:  1. Elevated troponin in the context of urosepsis/demand ischemia: Given development of precordial/anterior T wave inversions on 10/24, suspicious that he may have LAD lesion. Troponins peaked at 4.93 on 10/20, down to 0.72 on 10/24. EF 30-35%. Denies chest pain. Continue ASA and Coreg. Given recent elevation of transaminases, will hold off on addition of statin. No cath due to acute renal failure/urosepsis due to high risk of contrast-induced nephropathy. Would proceed with nuclear MPI study tomorrow. 2. Ischemic cardiomyopathy, EF 30-35%: No evidence of heart failure at present. Continue Coreg, Imdur, and hydralazine for optimization of intracardiac hemodynamics. No ACEI/ARB due to renal failure.  3. Urosepsis: On Primaxin. WBC count elevated from yesterday.  4. Essential HTN: On amlodipine. BP stable. Specifically, not hypotensive in context of urosepsis.  5.  Hypokalemia - replete potassium per primary service and repeat BMET in am   Sueanne Margarita, MD  07/24/2014  9:25 AM

## 2014-07-24 NOTE — Progress Notes (Signed)
PATIENT DETAILS Name: Jordan Ward Age: 73 y.o. Sex: male Date of Birth: 09/30/1940 Admit Date: 07/17/2014 Admitting Physician Collene Gobble, MD [id number], BENJAMIN, PA-C  Brief Summary: 73 y.o. Male admitted with septic shock. Managed in the ICU, required Vasopressors.Urine and Blood culture positive for Pseudomonas.  Transferred to [facility] on 10/24.  Subjective: No major issues overnight.  Assessment/Plan: Active Problems: Septic Shock:Resolved. Secondary to Pseudomonal UTI/bacteremia. Pseudomonal UTI/Bacteremia:source likely UTI. Continue with IV Invanz, will plan on 2 week course from 07/20/14. NSTEMI/Demand Ischemia:given decreased EF, Cards planning on ischemic eval once renal failure gets better. On ASA and Coreg. Ischemic cardiomyopathy, EF 30-35%:compensated. Continue Coreg, Imdur, and hydralazine.No ACEI/ARB due to renal failure. [id number] secondary ATN, obstructive uropathy. Suprapubic catheter in place. Creatinine slowly improving.Follow lytes Hypernatremia:secondary to poor oral intake, c/w D5W. Given low EF, monitor carefully. Encourage oral intake.Follow lytes. Acute Encephalopathy/Slurred speech:resolved.Neuro initially consulted, may be due to acute sepsis & associated renal failure. No clear evidence of acute exacerbation of MS at this point. Diarrhea:C Diff PCR negative. Suspect may be related to antibiotics.Diarrhea now resolved, passing formed stools.Rectal tube removed.Continue Probiotic. Thrombocytopenia:secondary to sepsis. HIT panel negative. Improving.Monitor CBC periodically [id number] with Imdur, Amlodipine,Coreg and Hydralazine Dysphagia:secondary to MS, on Dys 3 diet. SLP following Hx of [id number], no evidence of exacerbation.Resume Valium and prn Baclofen Deconditioning:secondary to critical illness, underlying MS. SNF on discharge. PT while inpatient Hx of Pul Fibrosis:stable, titrate off O2. Prn Nebs. Hypothyriodism:c/w Levothyroxine Hx of  chronic left sided hydronephroses/renal calculi:Urology following, no additional intervention is needed per Urology.  Disposition: Remain inpatient-SNF on discharge  Antibiotics:  Primaxin 10/22>>  Tressie Ellis 10/20>>10/22   Rocephin 10/19>>10/20  Flagyl 10/19>10/20  Vanco 10/19>>10/20  Zosyn 1019>>10/19  DVT Prophylaxis:  SCD's-if thrombocytopenic continues to improve, will start Lovenox/Heparin over the next few days  Code Status: Full code   Family Communication None at bedside  Procedures:  None  CONSULTS:  pulmonary/intensive care, neurology and urology  MEDICATIONS: Scheduled Meds: . amLODipine  5 mg Oral Daily  . aspirin  81 mg Oral Daily  . carvedilol  6.25 mg Oral BID WC  . collagenase   Topical Daily  . diazepam  5 mg Oral BID  . feeding supplement (ENSURE)  1 Container Oral TID BM  . hydrALAZINE  10 mg Oral 3 times per day  . imipenem-cilastatin  250 mg Intravenous Q8H  . isosorbide mononitrate  30 mg Oral Daily  . levothyroxine  50 mcg Oral QAC breakfast  . saccharomyces boulardii  250 mg Oral BID   Continuous Infusions: . dextrose 5 % 1,000 mL with potassium chloride 20 mEq infusion 75 mL/hr at 07/24/14 0837   PRN Meds:.sodium chloride, acetaminophen, baclofen, ipratropium-albuterol, loperamide, RESOURCE THICKENUP CLEAR  Antibiotics: Anti-infectives   Start     Dose/Rate Route Frequency Ordered Stop   07/20/14 1000  imipenem-cilastatin (PRIMAXIN) 250 mg in sodium chloride 0.9 % 100 mL IVPB     250 mg 200 mL/hr over 30 Minutes Intravenous Every 8 hours 07/20/14 0932     07/20/14 0930  imipenem-cilastatin (PRIMAXIN) 500 mg in sodium chloride 0.9 % 100 mL IVPB  Status:  Discontinued     500 mg 200 mL/hr over 30 Minutes Intravenous 3 times per day 07/20/14 0929 07/20/14 0931   07/18/14 0900  cefTAZidime (FORTAZ) 1 g in dextrose 5 % 50 mL IVPB  Status:  Discontinued     1 g 100 mL/hr over 30 Minutes Intravenous Every 24  hours 07/18/14 0831 07/20/14  0929   07/18/14 0000  vancomycin (VANCOCIN) 50 mg/mL oral solution 500 mg  Status:  Discontinued     500 mg Oral 4 times per day 07/17/14 2149 07/18/14 0949   07/17/14 2200  metroNIDAZOLE (FLAGYL) IVPB 500 mg  Status:  Discontinued     500 mg 100 mL/hr over 60 Minutes Intravenous Every 8 hours 07/17/14 2149 07/18/14 0949   07/17/14 1515  vancomycin (VANCOCIN) IVPB 1000 mg/200 mL premix     1,000 mg 200 mL/hr over 60 Minutes Intravenous  Once 07/17/14 1508 07/17/14 1652   07/17/14 1515  piperacillin-tazobactam (ZOSYN) IVPB 3.375 g     3.375 g 100 mL/hr over 30 Minutes Intravenous  Once 07/17/14 1508 07/17/14 1620   07/17/14 1400  cefTRIAXone (ROCEPHIN) 2 g in dextrose 5 % 50 mL IVPB  Status:  Discontinued     2 g 100 mL/hr over 30 Minutes Intravenous Every 24 hours 07/17/14 1339 07/18/14 0829   07/17/14 1315  cefTRIAXone (ROCEPHIN) 2 g in dextrose 5 % 50 mL IVPB  Status:  Discontinued     2 g 100 mL/hr over 30 Minutes Intravenous  Once 07/17/14 1313 07/17/14 1420       PHYSICAL EXAM: Vital signs in last 24 hours: Filed Vitals:   07/23/14 1400 07/23/14 1819 07/23/14 2215 07/24/14 0535  BP: 124/52 139/57 101/82 106/61  Pulse: 70 70 78 67  Temp: 98.2 F (36.8 C)  98.3 F (36.8 C) 98.2 F (36.8 C)  TempSrc: Oral  Oral Oral  Resp: 20  20 20   Height:      Weight:      SpO2: 100%  100% 100%    Weight change:  Filed Weights   07/18/14 0500 07/19/14 0500 07/20/14 0500  Weight: 62 kg (136 lb 11 oz) 63.5 kg (139 lb 15.9 oz) 61.6 kg (135 lb 12.9 oz)   Body mass index is 21.93 kg/(m^2).   Gen Exam: Awake and alert with clear speech.   Neck: Supple, No JVD.   Chest: B/L Clear.  No rales CVS: S1 S2 Regular, no murmurs.  Abdomen: soft, BS +, non tender, non distended.  Extremities: no edema, lower extremities warm to touch. Neurologic: Non Focal-but with gen. weakness Skin: No Rash.   Wounds: N/A.    Intake/Output from previous day:  Intake/Output Summary (Last 24 hours) at  07/24/14 1124 Last data filed at 07/24/14 0930  Gross per 24 hour  Intake 1423.17 ml  Output    600 ml  Net 823.17 ml     LAB RESULTS: CBC  Recent Labs Lab 07/18/14 0739 07/19/14 0216 07/20/14 0501 07/21/14 0518 07/22/14 0430 07/23/14 0609  WBC 19.7* 20.7* 22.4* 18.4* 14.0* 17.3*  HGB 9.3* 9.4* 10.0* 11.5* 10.4* 10.3*  HCT 28.7* 28.8* 29.3* 34.4* 32.1* 33.3*  PLT 82* 62* 57* 59* 74* 111*  MCV 92.6 91.7 86.4 86.6 89.9 94.6  MCH 30.0 29.9 29.5 29.0 29.1 29.3  MCHC 32.4 32.6 34.1 33.4 32.4 30.9  RDW 15.5 15.6* 15.1 15.0 15.6* 16.2*  LYMPHSABS 0.8 1.0 1.7 2.6  --   --   MONOABS 0.4 0.6 0.7 1.5*  --   --   EOSABS 0.0 0.2 0.1 0.2  --   --   BASOSABS 0.0 0.0 0.1 0.2*  --   --     Chemistries   Recent Labs Lab 07/18/14 0739  07/20/14 1000 07/21/14 0518 07/22/14 0430 07/23/14 0609 07/24/14 0420  NA 146  < >  145 145 155* 159* 158*  K 3.7  < > 2.8* 3.5* 4.0 3.9 3.3*  CL 114*  < > 111 115* 124* 124* 123*  CO2 15*  < > 19 16* 19 24 25   GLUCOSE 145*  < > 103* 98 94 114* 106*  BUN 55*  < > 52* 51* 50* 42* 29*  CREATININE 3.88*  < > 2.85* 2.50* 2.30* 2.09* 1.87*  CALCIUM 7.0*  < > 8.0* 8.0* 8.1* 8.0* 7.8*  MG 1.5  --   --   --  1.9  --   --   < > = values in this interval not displayed.  CBG:  Recent Labs Lab 07/19/14 2357 07/20/14 0829 07/20/14 1612 07/20/14 1840 07/20/14 2207  GLUCAP 134* 84 67* 141* 82    GFR Estimated Creatinine Clearance: 31.6 ml/min (by C-G formula based on Cr of 1.87).  Coagulation profile  Recent Labs Lab 07/17/14 1533 07/18/14 0119 07/18/14 0739  INR 1.61* 1.96* 1.68*    Cardiac Enzymes  Recent Labs Lab 07/18/14 0739 07/20/14 0501 07/22/14 1400  TROPONINI 4.93* 3.41* 0.72*    No components found with this basename: POCBNP,  No results found for this basename: DDIMER,  in the last 72 hours No results found for this basename: HGBA1C,  in the last 72 hours No results found for this basename: CHOL, HDL, LDLCALC, TRIG,  CHOLHDL, LDLDIRECT,  in the last 72 hours No results found for this basename: TSH, T4TOTAL, FREET3, T3FREE, THYROIDAB,  in the last 72 hours No results found for this basename: VITAMINB12, FOLATE, FERRITIN, TIBC, IRON, RETICCTPCT,  in the last 72 hours No results found for this basename: LIPASE, AMYLASE,  in the last 72 hours  Urine Studies No results found for this basename: UACOL, UAPR, USPG, UPH, UTP, UGL, UKET, UBIL, UHGB, UNIT, UROB, ULEU, UEPI, UWBC, URBC, UBAC, CAST, CRYS, UCOM, BILUA,  in the last 72 hours  MICROBIOLOGY: Recent Results (from the past 240 hour(s))  CULTURE, BLOOD (ROUTINE X 2)     Status: None   Collection Time    07/17/14 11:29 AM      Result Value Ref Range Status   Specimen Description BLOOD RIGHT HAND   Final   Special Requests BOTTLES DRAWN AEROBIC AND ANAEROBIC [place]   Final   Culture  Setup Time     Final   Value: 07/18/2014 14:01     Performed at [facility]   Culture     Final   Value: PSEUDOMONAS AERUGINOSA     Note: Gram Stain Report Called to,Read Back By and Verified With: [facility] A 0442 07/18/14 AT [place] BY Mikel Cella K Performed at [facility]     Performed at [facility]   Report Status 07/21/2014 FINAL   Final   Organism ID, Bacteria PSEUDOMONAS AERUGINOSA   Final  CULTURE, BLOOD (ROUTINE X 2)     Status: None   Collection Time    07/17/14 11:35 AM      Result Value Ref Range Status   Specimen Description BLOOD LEFT HAND   Final   Special Requests BOTTLES DRAWN AEROBIC AND ANAEROBIC 8CC   Final   Culture  Setup Time     Final   Value: 07/18/2014 14:01     Performed at [facility]   Culture     Final   Value: PSEUDOMONAS AERUGINOSA     Note: SUSCEPTIBILITIES PERFORMED ON PREVIOUS CULTURE WITHIN THE LAST 5 DAYS.  Note: Gram Stain Report Called to,Read Back By and Verified With: [facility] A 0552 07/18/14 AT [place] BY Mikel Cella K Performed at [facility]     Performed at [facility]    Report Status 07/20/2014 FINAL   Final  URINE CULTURE     Status: None   Collection Time    07/17/14 12:13 PM      Result Value Ref Range Status   Specimen Description URINE, CATHETERIZED   Final   Special Requests NONE   Final   Culture  Setup Time     Final   Value: 07/17/2014 12:56     Performed at [place]     Final   Value: >=100,000 COLONIES/ML     Performed at [facility]   Culture     Final   Value: Multiple bacterial morphotypes present, none predominant. Suggest appropriate recollection if clinically indicated.     Performed at [facility]   Report Status 07/20/2014 FINAL   Final  MRSA PCR SCREENING     Status: Abnormal   Collection Time    07/17/14  8:03 PM      Result Value Ref Range Status   MRSA by PCR POSITIVE (*) NEGATIVE Final   Comment:            The GeneXpert MRSA Assay (FDA     approved for NASAL specimens     only), is one component of a     comprehensive MRSA colonization     surveillance program. It is not     intended to diagnose MRSA     infection nor to guide or     monitor treatment for     MRSA infections.     RESULT CALLED TO, READ BACK BY AND VERIFIED WITH:     JAMES,L RN 2331 07/17/14 MITCHELL,L  CLOSTRIDIUM DIFFICILE BY PCR     Status: None   Collection Time    07/17/14  9:23 PM      Result Value Ref Range Status   C difficile by pcr NEGATIVE  NEGATIVE Final  URINE CULTURE     Status: None   Collection Time    07/21/14 10:59 AM      Result Value Ref Range Status   Specimen Description URINE, SUPRAPUBIC   Final   Special Requests NONE   Final   Culture  Setup Time     Final   Value: 07/21/2014 19:50     Performed at [place]     Final   Value: 30,000 COLONIES/ML     Performed at [facility]   Culture     Final   Value: [place]     Performed at [facility]   Report Status PENDING   Incomplete  CULTURE, BLOOD (ROUTINE X 2)      Status: None   Collection Time    07/22/14 10:40 AM      Result Value Ref Range Status   Specimen Description BLOOD RIGHT ARM   Final   Special Requests BOTTLES DRAWN AEROBIC ONLY 3CC   Final   Culture  Setup Time     Final   Value: 07/22/2014 18:04     Performed at [facility]   Culture     Final   Value:        BLOOD CULTURE RECEIVED NO GROWTH TO DATE CULTURE WILL BE  HELD FOR 5 DAYS BEFORE ISSUING A FINAL NEGATIVE REPORT     Performed at [facility]   Report Status PENDING   Incomplete  CULTURE, BLOOD (ROUTINE X 2)     Status: None   Collection Time    07/22/14 10:50 AM      Result Value Ref Range Status   Specimen Description BLOOD RIGHT HAND   Final   Special Requests BOTTLES DRAWN AEROBIC ONLY 2CC   Final   Culture  Setup Time     Final   Value: 07/22/2014 18:04     Performed at [facility]   Culture     Final   Value:        BLOOD CULTURE RECEIVED NO GROWTH TO DATE CULTURE WILL BE HELD FOR 5 DAYS BEFORE ISSUING A FINAL NEGATIVE REPORT     Performed at [facility]   Report Status PENDING   Incomplete    RADIOLOGY STUDIES/RESULTS: Ct Abdomen Pelvis Wo Contrast  07/18/2014   CLINICAL DATA:  Increased weakness. Acute sepsis. Increasing lack today.  EXAM: CT ABDOMEN AND PELVIS WITHOUT CONTRAST  TECHNIQUE: Multidetector CT imaging of the abdomen and pelvis was performed following the standard protocol without IV contrast.  COMPARISON:  07/21/2011  FINDINGS: Small bilateral pleural effusions with atelectasis or infiltration in both lung bases. Coronary artery and aortic valve calcifications.  The left kidney is significantly enlarged in comparison to the right. There is left hydronephrosis and hydroureter. Stranding around the left kidney in ureter. No ureteral stones are demonstrated but 2 stones are shown in the bladder. There are multiple intrarenal stones in the left renal pelvis and lower pole right kidney. Additional calcifications in both  kidneys are probably vascular. Low-attenuation lesions arising from the upper pole of the left kidney consistent with cysts and similar to previous study.  Surgical absence of the gallbladder. Mild bile duct dilatation. Vague increased density at the area of the distal common bile duct may represent an intraductal stone. The unenhanced appearance of the liver, spleen, pancreas, adrenal glands, inferior vena cava, and retroperitoneal lymph nodes is unremarkable. Vascular calcifications in the abdominal aorta without aneurysm. Stomach, small bowel, and colon are not abnormally distended. There is a broad-based anterior abdominal Laskaris hernia which contains bowel but without evidence of compromise. No free air or free fluid in the abdomen.  Pelvis: Small amount of free fluid in the pelvis is nonspecific but likely reactive. Foley catheter decompresses the bladder. No evidence of diverticulitis. Appendix is probably surgically absent. Degenerative changes in the lumbar spine. No destructive bone lesions appreciated.  IMPRESSION: Moderate obstruction of the left kidney and ureter. Bladder stones are present, probably representing recently passed stones. No definite distal ureteral stones otherwise identified. Bilateral intrarenal stones. Stones in the left renal pelvis. Foley catheter in the bladder. Small amount of free fluid in the pelvis.   Electronically Signed   By: Lucienne Capers M.D.   On: 07/18/2014 04:42   Dg Chest Port 1 View  07/18/2014   CLINICAL DATA:  Central line placement.  EXAM: PORTABLE CHEST - 1 VIEW  COMPARISON:  07/18/2014  FINDINGS: The right IJ central venous catheter tip is in the distal SVC approximately 2 cm above the cavoatrial junction. No complicating features. The heart and lungs are stable. No pneumothorax.  IMPRESSION: Right IJ central venous catheter tip is in the distal SVC. No complicating features.   Electronically Signed   By: Kalman Jewels M.D.   On: 07/18/2014  12:04   Dg  Chest Port 1 View  07/18/2014   CLINICAL DATA:  Sepsis, shortness of breath, personal history of multiple sclerosis, peripheral vascular disease, TIA, pulmonary fibrosis, hypertension, coronary artery disease  EXAM: PORTABLE CHEST - 1 VIEW  COMPARISON:  Portable exam 0441 hr compared to 07/17/2014  FINDINGS: Normal heart size, mediastinal contours and pulmonary vascularity.  Atherosclerotic calcification aorta.  Mild RIGHT basilar atelectasis.  Atelectasis versus infiltrate developing in LEFT lower lobe.  Remaining lungs clear.  No definite pleural effusion or pneumothorax.  IMPRESSION: Mild RIGHT basilar atelectasis with developing infiltrate versus atelectasis in LEFT lower lobe.   Electronically Signed   By: Lavonia Dana M.D.   On: 07/18/2014 07:48   Dg Chest Port 1 View  07/17/2014   CLINICAL DATA:  Recurrent urinary tract infections.  Fever.  EXAM: PORTABLE CHEST - 1 VIEW  COMPARISON:  November 10, 2011.  FINDINGS: The heart size and mediastinal contours are within normal limits. Both lungs are clear. No pneumothorax or pleural effusion is noted. The visualized skeletal structures are unremarkable.  IMPRESSION: No acute cardiopulmonary abnormality seen.   Electronically Signed   By: Sabino Dick M.D.   On: 07/17/2014 13:48   Dg Abd Portable 1v  07/17/2014   CLINICAL DATA:  73 year old male with acute abdominal pain. Initial encounter.  EXAM: PORTABLE ABDOMEN - 1 VIEW  COMPARISON:  CT Abdomen and Pelvis 07/21/2011.  FINDINGS: Portable AP supine view at 2237 hrs. Calcified atherosclerosis including bilateral renal hilar calcifications. Superimposed nephrolithiasis. Stable cholecystectomy clips. Mild respiratory motion. Non obstructed bowel gas pattern. No acute osseous abnormality identified.  IMPRESSION: 1.  Non obstructed bowel gas pattern. 2. Chronic nephrolithiasis and vascular calcified atherosclerosis.   Electronically Signed   By: Lars Pinks M.D.   On: 07/17/2014 22:55   Dg Swallowing Func-speech  Pathology  07/20/2014   Selinda Orion Page, CCC-SLP     07/20/2014  4:15 PM Objective Swallowing Evaluation: Modified barium swallow  evaluation  Patient Details  Name: SMARAN BEE MRN: [id number] Date of Birth: September 07, 1941  Today's Date: 07/20/2014 Time: 1030-1100 SLP Time Calculation (min): 30 min  Past Medical History:  Past Medical History  Diagnosis Date  . Arthritis   . MS (multiple sclerosis)   . Insomnia   . PVD (peripheral vascular disease)   . Carotid artery stenosis   . TIA (transient ischemic attack)   . Chronic indwelling Foley catheter 10/06/2011  . Pulmonary fibrosis 10/06/2011  . Dysphagia 10/07/2011  . Tremors of nervous system 10/08/2011  . Hypothyroidism 10/08/2011  . Pulmonary nodule 10/08/2011  . C. difficile colitis 09/2011  . Pneumonia   . Encephalopathy   . Urinary tract infection   . Tremors of nervous system   . Hypokalemia   . Junctional rhythm   . Anemia   . Hypernatremia   . Sacral ulcer   . HTN (hypertension), malignant 10/06/2011  . Sleep apnea   . Neuromuscular disorder   . Back pain, chronic   . Kidney stones   . High grade dysplasia in colonic adenoma 09/2005  . Gait disorder   . OSA (obstructive sleep apnea)   . Paroxysmal atrial tachycardia   . Dyslipidemia   . Cerebrovascular disease   . CAD (coronary artery disease)   . Peripheral vascular disease   . Bilateral carotid bruits   . Colon polyps   . HA (headache)    Past Surgical History:  Past Surgical History  Procedure Laterality Date  . Inguinal  hernia repair  1971    bilateral  . Back surgery  1976/1979  . Colonoscopy  11/2004    Dr. Sharol Roussel sessile polyp splenic flexure, 42mm sessile  polyp desc colon, tubulovillous adenoma (bx not removed)  . Colonoscopy  01/2005    poor prep, polyp could not be found  . Colonoscopy  05/2005    with EMR, polypectomy Dr. Olegario Messier, bx showed high grade  dysplasia, partially resected  . Colonoscopy  09/2005    Dr. Arsenio Loader, [place] ink tattooing, four villous colon polyp (3  had been missed on previous  colonoscopies due to limitations of  procedures  . Colon surgery  09/2005    Fleishman: four tubular adenomas, large adenomatous polyp with  HIGH GRADE dysplasia  . Appendectomy  09/2005    at time of left hemicolectomy  . Colonoscopy  09/2006    normal TI, no polyps  . Colonoscopy  10/2007    Dr. Imogene Burn distal mammillations, benign bx, normal TI,  random bx neg for microscopic colitis  . Cholecystectomy      Dr. Tamala Julian  . Suprapubic catheter insertion     HPI:   73 y.o. male with hx of long-standing MS brought to [facility] ED on  10/19 with slurred speech. Dx of severe sepsis. Pt has a hx of  variable neurogenic dysphagia. Last MBS studies were at [facility] in  Jan and Feb of 2013, the latter of which recommended  nectar-liquids/modified diet secondary to aspiration. Per report,  dysphagia resolved and pt has been eating a regular diet with  thin liquids.  Pt initiated on Dys 3 solids and honey thick  liquids on 10/21 due to overt s/s of aspiration noted at bedside.   MBS ordered 10/22 to objectively determine safest diet  consistencies.       Assessment / Plan / Recommendation Clinical Impression  Dysphagia Diagnosis: Mild pharyngeal phase dysphagia;Moderate  pharyngeal phase dysphagia;Mild oral phase dysphagia Pt presents with a moderate multifactorial dysphagia with both  sensory and motor impairments.  Pt presents with generalized oral  motor weakness which resulted in oral residue post swallow and  prolonged transit of materials into the oropharynx.  Pt exhibited  a delayed swallow response due to decreased pharyngeal sensation  and overall poor control of boluses which resulted in silent  aspiration of large cup sips of nectar thick liquids.  Attempts  to better control boluses with small cup sips or teaspoons of  nectar thick liquids slightly improved airway protection but  still resulted in silent penetration.  Additional pharyngeal  phase impairments were characterized by decreased base of tongue  retraction, poor  tongue to palate contact, and decreased  laryngeal elevation which resulted in inconsistent amounts of  pharyngeal residue across all consistencies.  SLP also suspects  some component of esophageal dysphagia impacting pharyngeal  residue due to pooling of materials at the UES and intermittent  backflow of materials into the cervical esophagus.  The  abovementioned functional impairments appeared to resolve with  increased time between bites/sips Therefore, recommend pt  continue on Dys 3 diet with honey thick liquids, meds crushed in  puree, full supervision for use of the following swallowing  precautions: slow rate, small bites/sips, and alternate solids  and liquids.  Prognosis for advancement good with trials of  advanced consistencies and pharyngeal strengthening exercises.   Recommend repeat MBS to objectively determine readiness for diet  advancement.     Treatment Recommendation  Therapy as outlined in treatment plan below  Diet Recommendation Honey-thick liquid;Dysphagia 3 (Mechanical  Soft)   Liquid Administration via: Cup Medication Administration: Crushed with puree Supervision: Patient able to self feed;Full supervision/cueing  for compensatory strategies Compensations: Slow rate;Small sips/bites;Follow solids with  liquid Postural Changes and/or Swallow Maneuvers: Seated upright 90  degrees    Other  Recommendations Oral Care Recommendations: Oral care BID   Follow Up Recommendations   (TBD)    Frequency and Duration min 2x/week  1 week   Pertinent Vitals/Pain n/a     General Date of Onset: 07/17/14 Reason for Referral: Objectively evaluate swallowing function Previous Swallow Assessment: 2/13 Diet Prior to this Study: Dysphagia 3 (soft);Honey-thick liquids Respiratory Status: Room air Behavior/Cognition: Alert Oral Cavity - Dentition: Adequate natural dentition Oral Motor / Sensory Function: Impaired - see Bedside swallow  eval Baseline Vocal Quality: Low vocal intensity Volitional Cough: Strong  Volitional Swallow: Unable to elicit Anatomy: Within functional limits Pharyngeal Secretions: Not observed secondary MBS    Reason for Referral Objectively evaluate swallowing function   Oral Phase Oral Preparation/Oral Phase Oral Phase: Impaired Oral - Honey Oral - Honey Cup: Reduced posterior propulsion;Delayed oral  transit;Lingual pumping Oral - Nectar Oral - Nectar Teaspoon: Lingual pumping;Reduced posterior  propulsion;Delayed oral transit Oral - Nectar Cup: Lingual pumping;Reduced posterior  propulsion;Delayed oral transit Oral - Solids Oral - Puree: Delayed oral transit;Reduced posterior  propulsion;Lingual pumping Oral - Mechanical Soft: Weak lingual manipulation;Impaired  mastication;Reduced posterior propulsion;Delayed oral  transit;Lingual pumping;Lingual/palatal residue   Pharyngeal Phase Pharyngeal - Honey Pharyngeal - Honey Cup: Delayed swallow initiation;Premature  spillage to valleculae;Pharyngeal residue - valleculae;Reduced  tongue base retraction;Reduced laryngeal elevation;Pharyngeal  residue - pyriform sinuses;Pharyngeal residue - cp segment Pharyngeal - Nectar Pharyngeal - Nectar Teaspoon: Delayed swallow  initiation;Penetration/Aspiration during swallow;Pharyngeal  residue - pyriform sinuses;Reduced tongue base  retraction;Pharyngeal residue - valleculae;Reduced laryngeal  elevation;Pharyngeal residue - cp segment Penetration/Aspiration details (nectar teaspoon): Material enters  airway, remains ABOVE vocal cords and not ejected out Pharyngeal - Nectar Cup: Delayed swallow initiation;Reduced  laryngeal elevation;Reduced tongue base  retraction;Penetration/Aspiration during swallow;Pharyngeal  residue - pyriform sinuses;Pharyngeal residue - cp  segment;Pharyngeal residue - posterior pharnyx Penetration/Aspiration details (nectar cup): Material enters  airway, passes BELOW cords without attempt by patient to eject  out (silent aspiration);Material enters airway, remains ABOVE  vocal cords and  not ejected out Pharyngeal - Solids Pharyngeal - Puree: Delayed swallow initiation;Premature spillage  to valleculae;Pharyngeal residue - valleculae;Pharyngeal residue  - cp segment;Pharyngeal residue - pyriform sinuses;Reduced tongue  base retraction;Reduced laryngeal elevation Pharyngeal - Mechanical Soft: Delayed swallow  initiation;Premature spillage to valleculae;Reduced laryngeal  elevation;Reduced tongue base retraction;Pharyngeal residue -  valleculae;Pharyngeal residue - posterior pharnyx;Pharyngeal  residue - pyriform sinuses;Pharyngeal residue - cp segment  Cervical Esophageal Phase    GO    Cervical Esophageal Phase Cervical Esophageal Phase: Impaired Cervical Esophageal Phase - Comment Cervical Esophageal Comment: intermittently decreased movement of  materials through the UES which is likely related to decreased  hyolaryngeal excursion and increased esophageal pressure with  intermittent backflow into the cervical  esophagus         Page, Selinda Orion 07/20/2014, 4:14 PM     Oren Binet, MD  [facility] Pager:[phone]  If 7PM-7AM, please contact night-coverage [email or web address] Password [email or web address] 07/24/2014, 11:24 AM   LOS: 7 days

## 2014-07-25 ENCOUNTER — Inpatient Hospital Stay (HOSPITAL_COMMUNITY): Payer: Medicare Other

## 2014-07-25 DIAGNOSIS — E039 Hypothyroidism, unspecified: Secondary | ICD-10-CM

## 2014-07-25 LAB — BASIC METABOLIC PANEL
ANION GAP: 12 (ref 5–15)
BUN: 23 mg/dL (ref 6–23)
CO2: 23 mEq/L (ref 19–32)
CREATININE: 1.74 mg/dL — AB (ref 0.50–1.35)
Calcium: 7.9 mg/dL — ABNORMAL LOW (ref 8.4–10.5)
Chloride: 117 mEq/L — ABNORMAL HIGH (ref 96–112)
GFR, EST AFRICAN AMERICAN: 44 mL/min — AB (ref >90)
GFR, EST NON AFRICAN AMERICAN: 38 mL/min — AB (ref >90)
Glucose, Bld: 110 mg/dL — ABNORMAL HIGH (ref 70–99)
Potassium: 3.7 mEq/L (ref 3.7–5.3)
Sodium: 152 mEq/L — ABNORMAL HIGH (ref 137–147)

## 2014-07-25 LAB — CBC
HEMATOCRIT: 32 % — AB (ref 39.0–52.0)
Hemoglobin: 9.9 g/dL — ABNORMAL LOW (ref 13.0–17.0)
MCH: 29.7 pg (ref 26.0–34.0)
MCHC: 30.9 g/dL (ref 30.0–36.0)
MCV: 96.1 fL (ref 78.0–100.0)
PLATELETS: 127 10*3/uL — AB (ref 150–400)
RBC: 3.33 MIL/uL — ABNORMAL LOW (ref 4.22–5.81)
RDW: 17.2 % — AB (ref 11.5–15.5)
WBC: 17 10*3/uL — ABNORMAL HIGH (ref 4.0–10.5)

## 2014-07-25 MED ORDER — ATORVASTATIN CALCIUM 40 MG PO TABS
40.0000 mg | ORAL_TABLET | Freq: Every day | ORAL | Status: DC
  Filled 2014-07-25: qty 1

## 2014-07-25 MED ORDER — TECHNETIUM TC 99M SESTAMIBI GENERIC - CARDIOLITE
10.0000 | Freq: Once | INTRAVENOUS | Status: AC | PRN
  Administered 2014-07-25: 10 via INTRAVENOUS

## 2014-07-25 MED ORDER — ENOXAPARIN SODIUM 30 MG/0.3ML ~~LOC~~ SOLN
30.0000 mg | SUBCUTANEOUS | Status: DC
Start: 2014-07-25 — End: 2014-07-26
  Administered 2014-07-25 – 2014-07-26 (×2): 30 mg via SUBCUTANEOUS
  Filled 2014-07-25 (×2): qty 0.3

## 2014-07-25 MED ORDER — REGADENOSON 0.4 MG/5ML IV SOLN
INTRAVENOUS | Status: AC
  Administered 2014-07-25: 0.4 mg via INTRAVENOUS
  Filled 2014-07-25: qty 5

## 2014-07-25 MED ORDER — TECHNETIUM TC 99M SESTAMIBI GENERIC - CARDIOLITE
30.0000 | Freq: Once | INTRAVENOUS | Status: AC | PRN
  Administered 2014-07-25: 30 via INTRAVENOUS

## 2014-07-25 MED ORDER — REGADENOSON 0.4 MG/5ML IV SOLN
0.4000 mg | Freq: Once | INTRAVENOUS | Status: AC
  Administered 2014-07-25: 0.4 mg via INTRAVENOUS
  Filled 2014-07-25: qty 5

## 2014-07-25 NOTE — Progress Notes (Signed)
Physical Therapy Treatment Patient Details Name: Jordan Ward MRN: [id number] DOB: Nov 08, 1940 Today's Date: 07/25/2014    History of Present Illness Pt admitted to [facility] on 10/19 with slurred speech and abdominal pain.  Pt with sepsis due to UTI, hypotensive.  PMH:  MS, HTN, CAD, NSTEMI, c diff..  To nuclear med for cardiac stress test 10/27.    PT Comments    Pt very happy with his performance today.  Mildly unsteady, but improvements noted with stability as he progressed.  Follow Up Recommendations  CIR     Equipment Recommendations  Other (comment)    Recommendations for Other Services Rehab consult     Precautions / Restrictions Precautions Precautions: Fall Precaution Comments: suprapubic catheter, flexiseal    Mobility  Bed Mobility Overal bed mobility: Needs Assistance Bed Mobility: Supine to Sit   Sidelying to sit: Min assist       General bed mobility comments: use of rail and very minimal truncal assist  Transfers Overall transfer level: Needs assistance Equipment used: Rolling walker (2 wheeled) Transfers: Sit to/from [facility] Sit to Stand: Min assist;Min guard Stand pivot transfers: Min guard;Min assist       General transfer comment: improved over session  Ambulation/Gait Ambulation/Gait assistance: Min assist Ambulation Distance (Feet): 4 Feet (forward and back x2 and 20 feet x2 with rest in between) Assistive device: Rolling walker (2 wheeled) Gait Pattern/deviations: Step-through pattern;Decreased step length - right;Decreased step length - left;Shuffle Gait velocity: very slow   General Gait Details: mildly unsteady, but improve through out session.  No signs of dizziness   Stairs            Wheelchair Mobility    Modified Rankin (Stroke Patients Only)       Balance Overall balance assessment: Needs assistance Sitting-balance support: No upper extremity supported Sitting balance-Leahy Scale: Fair      Standing balance support: Bilateral upper extremity supported Standing balance-Leahy Scale: Poor (working toward fair)                      Cognition Arousal/Alertness: Awake/alert Behavior During Therapy: WFL for tasks assessed/performed Overall Cognitive Status: History of cognitive impairments - at baseline     Current Attention Level: Selective;Sustained           General Comments: needed some redirection    Exercises      General Comments        Pertinent Vitals/Pain Pain Assessment: No/denies pain    Home Living                      Prior Function            PT Goals (current goals can now be found in the care plan section) Acute Rehab PT Goals Patient Stated Goal: Return to PLOF. PT Goal Formulation: With patient Time For Goal Achievement: 07/27/14 Potential to Achieve Goals: Good Progress towards PT goals: Progressing toward goals    Frequency  Min 3X/week    PT Plan Current plan remains appropriate    Co-evaluation             End of Session   Activity Tolerance: Patient tolerated treatment well Patient left: in chair;with call bell/phone within reach     Time: 1700-1726 PT Time Calculation (min): 26 min  Charges:  $Gait Training: 8-22 mins $Therapeutic Activity: 8-22 mins  G Codes:      Wells Gerdeman, Tessie Fass 07/25/2014, 5:37 PM 07/25/2014  Donnella Sham, [place] [phone]  (pager)

## 2014-07-25 NOTE — Progress Notes (Signed)
Nuclear stress test results reviewed and showed Inferior, inferolateral and distal anteroseptal scar. No evidence  of inducible myocardial ischemia. Global hypokinesis and septal dyskinesis. Left ventricular ejection fraction 33%.  He has not had any chest pain so will treat medically for now given underlying renal failure.  Continue ASA/BB/Nitrates/hydralazine.  Will add Atorvastatin 40mg  daily and check FLP and ALT In am.

## 2014-07-25 NOTE — Progress Notes (Signed)
OT Cancellation Note  Patient Details Name: Jordan Ward MRN: [id number] DOB: 09-24-1941   Cancelled Treatment:    Reason Eval/Treat Not Completed: Patient at procedure or test/ unavailable, pt. Scheduled for stress test this a.m, nursing requests hold until test completed.  Will check back as pt. Able.  Noted for possible d/c snf tomorrow.  Janice Coffin, COTA/L 07/25/2014, 9:47 AM

## 2014-07-25 NOTE — Progress Notes (Signed)
PATIENT DETAILS Name: Jordan Ward Age: 73 y.o. Sex: male Date of Birth: 12-06-1940 Admit Date: 07/17/2014 Admitting Physician Collene Gobble, MD [id number], BENJAMIN, PA-C  Brief Summary: 73 y.o. Male admitted with septic shock. Managed in the ICU, required Vasopressors.Urine and Blood culture positive for Pseudomonas.  Transferred to [facility] on 10/24.  Subjective: No major issues overnight.For Nuclear stress in am.  Assessment/Plan: Active Problems: Septic Shock:Resolved. Secondary to Pseudomonal UTI/bacteremia. Pseudomonal UTI/Bacteremia:source likely UTI. Continue with IV Invanz, will plan on 2 week course from 07/20/14. NSTEMI/Demand Ischemia:given decreased EF, Cards planning on ischemic eval once renal failure gets better. On ASA and Coreg. Ischemic cardiomyopathy, EF 30-35%:compensated. Continue Coreg, Imdur, and hydralazine.No ACEI/ARB due to renal failure. [id number] secondary ATN, obstructive uropathy. Suprapubic catheter in place. Creatinine slowly improving.Follow lytes Hypernatremia:secondary to poor oral intake, c/w D5W but decrease rate. Given low EF, monitor carefully. Encourage oral intake.Follow lytes. Acute Encephalopathy/Slurred speech:resolved.Neuro initially consulted, may be due to acute sepsis & associated renal failure. No clear evidence of acute exacerbation of MS at this point. Diarrhea:C Diff PCR negative. Suspect may be related to antibiotics.Diarrhea now resolved, passing formed stools.Rectal tube removed.Continue Probiotic. Thrombocytopenia:secondary to sepsis. HIT panel negative. Improving.Monitor CBC periodically [id number] with Imdur, Amlodipine,Coreg and Hydralazine Dysphagia:secondary to MS, on Dys 3 diet. SLP following Hx of [id number], no evidence of exacerbation.Resume Valium and prn Baclofen Deconditioning:secondary to critical illness, underlying MS. SNF on discharge. PT while inpatient Hx of Pul Fibrosis:stable, titrate off O2. Prn  Nebs. Hypothyriodism:c/w Levothyroxine Hx of chronic left sided hydronephroses/renal calculi:Urology following, no additional intervention is needed per Urology.  Disposition: Remain inpatient-SNF on discharge  Antibiotics:  Primaxin 10/22>>  Tressie Ellis 10/20>>10/22   Rocephin 10/19>>10/20  Flagyl 10/19>10/20  Vanco 10/19>>10/20  Zosyn 1019>>10/19  DVT Prophylaxis:  SCD's-since thrombocytopenic continues to improve, will start Lovenox from 10/27-please continue to watch platelets closely  Code Status: Full code   Family Communication None at bedside  Procedures:  None  CONSULTS:  pulmonary/intensive care, neurology and urology  MEDICATIONS: Scheduled Meds: . amLODipine  5 mg Oral Daily  . aspirin  81 mg Oral Daily  . carvedilol  6.25 mg Oral BID WC  . collagenase   Topical Daily  . diazepam  5 mg Oral BID  . feeding supplement (ENSURE)  1 Container Oral TID BM  . hydrALAZINE  10 mg Oral 3 times per day  . imipenem-cilastatin  250 mg Intravenous Q8H  . isosorbide mononitrate  30 mg Oral Daily  . levothyroxine  50 mcg Oral QAC breakfast  . saccharomyces boulardii  250 mg Oral BID   Continuous Infusions: . dextrose 5 % 1,000 mL with potassium chloride 20 mEq infusion 40 mL/hr at 07/25/14 1056   PRN Meds:.sodium chloride, acetaminophen, baclofen, ipratropium-albuterol, lip balm, loperamide, RESOURCE THICKENUP CLEAR  Antibiotics: Anti-infectives   Start     Dose/Rate Route Frequency Ordered Stop   07/20/14 1000  imipenem-cilastatin (PRIMAXIN) 250 mg in sodium chloride 0.9 % 100 mL IVPB     250 mg 200 mL/hr over 30 Minutes Intravenous Every 8 hours 07/20/14 0932     07/20/14 0930  imipenem-cilastatin (PRIMAXIN) 500 mg in sodium chloride 0.9 % 100 mL IVPB  Status:  Discontinued     500 mg 200 mL/hr over 30 Minutes Intravenous 3 times per day 07/20/14 0929 07/20/14 0931   07/18/14 0900  cefTAZidime (FORTAZ) 1 g in dextrose 5 % 50 mL IVPB  Status:  Discontinued  1 g 100 mL/hr over 30 Minutes Intravenous Every 24 hours 07/18/14 0831 07/20/14 0929   07/18/14 0000  vancomycin (VANCOCIN) 50 mg/mL oral solution 500 mg  Status:  Discontinued     500 mg Oral 4 times per day 07/17/14 2149 07/18/14 0949   07/17/14 2200  metroNIDAZOLE (FLAGYL) IVPB 500 mg  Status:  Discontinued     500 mg 100 mL/hr over 60 Minutes Intravenous Every 8 hours 07/17/14 2149 07/18/14 0949   07/17/14 1515  vancomycin (VANCOCIN) IVPB 1000 mg/200 mL premix     1,000 mg 200 mL/hr over 60 Minutes Intravenous  Once 07/17/14 1508 07/17/14 1652   07/17/14 1515  piperacillin-tazobactam (ZOSYN) IVPB 3.375 g     3.375 g 100 mL/hr over 30 Minutes Intravenous  Once 07/17/14 1508 07/17/14 1620   07/17/14 1400  cefTRIAXone (ROCEPHIN) 2 g in dextrose 5 % 50 mL IVPB  Status:  Discontinued     2 g 100 mL/hr over 30 Minutes Intravenous Every 24 hours 07/17/14 1339 07/18/14 0829   07/17/14 1315  cefTRIAXone (ROCEPHIN) 2 g in dextrose 5 % 50 mL IVPB  Status:  Discontinued     2 g 100 mL/hr over 30 Minutes Intravenous  Once 07/17/14 1313 07/17/14 1420       PHYSICAL EXAM: Vital signs in last 24 hours: Filed Vitals:   07/24/14 0535 07/24/14 1330 07/24/14 2156 07/25/14 0619  BP: 106/61 116/72 98/62 120/63  Pulse: 67 68 72 70  Temp: 98.2 F (36.8 C) 98 F (36.7 C) 97.7 F (36.5 C) 99.4 F (37.4 C)  TempSrc: Oral Oral Oral Oral  Resp: 20 18 18 18   Height:      Weight:      SpO2: 100% 100% 100% 99%    Weight change:  Filed Weights   07/18/14 0500 07/19/14 0500 07/20/14 0500  Weight: 62 kg (136 lb 11 oz) 63.5 kg (139 lb 15.9 oz) 61.6 kg (135 lb 12.9 oz)   Body mass index is 21.93 kg/(m^2).   Gen Exam: Awake and alert with clear speech.  Not in any distress Neck: Supple, No JVD.   Chest: B/L Clear.  No rales or rhonchi. CVS: S1 S2 Regular, no murmurs.  Abdomen: soft, BS +, non tender, non distended.  Extremities: no edema, lower extremities warm to touch. Neurologic: Non  Focal-but with gen. weakness Skin: No Rash.   Wounds: N/A.    Intake/Output from previous day:  Intake/Output Summary (Last 24 hours) at 07/25/14 1058 Last data filed at 07/25/14 0900  Gross per 24 hour  Intake 1666.5 ml  Output   4025 ml  Net -2358.5 ml     LAB RESULTS: CBC  Recent Labs Lab 07/19/14 0216 07/20/14 0501 07/21/14 0518 07/22/14 0430 07/23/14 0609 07/25/14 0646  WBC 20.7* 22.4* 18.4* 14.0* 17.3* 17.0*  HGB 9.4* 10.0* 11.5* 10.4* 10.3* 9.9*  HCT 28.8* 29.3* 34.4* 32.1* 33.3* 32.0*  PLT 62* 57* 59* 74* 111* 127*  MCV 91.7 86.4 86.6 89.9 94.6 96.1  MCH 29.9 29.5 29.0 29.1 29.3 29.7  MCHC 32.6 34.1 33.4 32.4 30.9 30.9  RDW 15.6* 15.1 15.0 15.6* 16.2* 17.2*  LYMPHSABS 1.0 1.7 2.6  --   --   --   MONOABS 0.6 0.7 1.5*  --   --   --   EOSABS 0.2 0.1 0.2  --   --   --   BASOSABS 0.0 0.1 0.2*  --   --   --     Chemistries  Recent Labs Lab 07/21/14 0518 07/22/14 0430 07/23/14 0609 07/24/14 0420 07/25/14 0646  NA 145 155* 159* 158* 152*  K 3.5* 4.0 3.9 3.3* 3.7  CL 115* 124* 124* 123* 117*  CO2 16* 19 24 25 23   GLUCOSE 98 94 114* 106* 110*  BUN 51* 50* 42* 29* 23  CREATININE 2.50* 2.30* 2.09* 1.87* 1.74*  CALCIUM 8.0* 8.1* 8.0* 7.8* 7.9*  MG  --  1.9  --   --   --     CBG:  Recent Labs Lab 07/19/14 2357 07/20/14 0829 07/20/14 1612 07/20/14 1840 07/20/14 2207  GLUCAP 134* 84 67* 141* 82    GFR Estimated Creatinine Clearance: 33.9 ml/min (by C-G formula based on Cr of 1.74).  Coagulation profile No results found for this basename: INR, PROTIME,  in the last 168 hours  Cardiac Enzymes  Recent Labs Lab 07/20/14 0501 07/22/14 1400  TROPONINI 3.41* 0.72*    No components found with this basename: POCBNP,  No results found for this basename: DDIMER,  in the last 72 hours No results found for this basename: HGBA1C,  in the last 72 hours No results found for this basename: CHOL, HDL, LDLCALC, TRIG, CHOLHDL, LDLDIRECT,  in the last 72  hours No results found for this basename: TSH, T4TOTAL, FREET3, T3FREE, THYROIDAB,  in the last 72 hours No results found for this basename: VITAMINB12, FOLATE, FERRITIN, TIBC, IRON, RETICCTPCT,  in the last 72 hours No results found for this basename: LIPASE, AMYLASE,  in the last 72 hours  Urine Studies No results found for this basename: UACOL, UAPR, USPG, UPH, UTP, UGL, UKET, UBIL, UHGB, UNIT, UROB, ULEU, UEPI, UWBC, URBC, UBAC, CAST, CRYS, UCOM, BILUA,  in the last 72 hours  MICROBIOLOGY: Recent Results (from the past 240 hour(s))  CULTURE, BLOOD (ROUTINE X 2)     Status: None   Collection Time    07/17/14 11:29 AM      Result Value Ref Range Status   Specimen Description BLOOD RIGHT HAND   Final   Special Requests BOTTLES DRAWN AEROBIC AND ANAEROBIC [place]   Final   Culture  Setup Time     Final   Value: 07/18/2014 14:01     Performed at [facility]   Culture     Final   Value: PSEUDOMONAS AERUGINOSA     Note: Gram Stain Report Called to,Read Back By and Verified With: [facility] A [id number] 07/18/14 AT [place] BY Mikel Cella K Performed at [facility]     Performed at [facility]   Report Status 07/21/2014 FINAL   Final   Organism ID, Bacteria PSEUDOMONAS AERUGINOSA   Final  CULTURE, BLOOD (ROUTINE X 2)     Status: None   Collection Time    07/17/14 11:35 AM      Result Value Ref Range Status   Specimen Description BLOOD LEFT HAND   Final   Special Requests BOTTLES DRAWN AEROBIC AND ANAEROBIC 8CC   Final   Culture  Setup Time     Final   Value: 07/18/2014 14:01     Performed at [facility]   Culture     Final   Value: PSEUDOMONAS AERUGINOSA     Note: SUSCEPTIBILITIES PERFORMED ON PREVIOUS CULTURE WITHIN THE LAST 5 DAYS.     Note: Gram Stain Report Called to,Read Back By and Verified With: [facility] A [id number] 07/18/14 AT [place] BY Mikel Cella K Performed at [facility]  Performed at [facility]   Report Status 07/20/2014 FINAL   Final   URINE CULTURE     Status: None   Collection Time    07/17/14 12:13 PM      Result Value Ref Range Status   Specimen Description URINE, CATHETERIZED   Final   Special Requests NONE   Final   Culture  Setup Time     Final   Value: 07/17/2014 12:56     Performed at [place]     Final   Value: >=100,000 COLONIES/ML     Performed at [facility]   Culture     Final   Value: Multiple bacterial morphotypes present, none predominant. Suggest appropriate recollection if clinically indicated.     Performed at [facility]   Report Status 07/20/2014 FINAL   Final  MRSA PCR SCREENING     Status: Abnormal   Collection Time    07/17/14  8:03 PM      Result Value Ref Range Status   MRSA by PCR POSITIVE (*) NEGATIVE Final   Comment:            The GeneXpert MRSA Assay (FDA     approved for NASAL specimens     only), is one component of a     comprehensive MRSA colonization     surveillance program. It is not     intended to diagnose MRSA     infection nor to guide or     monitor treatment for     MRSA infections.     RESULT CALLED TO, READ BACK BY AND VERIFIED WITH:     JAMES,L RN [id number] 07/17/14 MITCHELL,L  CLOSTRIDIUM DIFFICILE BY PCR     Status: None   Collection Time    07/17/14  9:23 PM      Result Value Ref Range Status   C difficile by pcr NEGATIVE  NEGATIVE Final  URINE CULTURE     Status: None   Collection Time    07/21/14 10:59 AM      Result Value Ref Range Status   Specimen Description URINE, SUPRAPUBIC   Final   Special Requests NONE   Final   Culture  Setup Time     Final   Value: 07/21/2014 19:50     Performed at [place]     Final   Value: 30,000 COLONIES/ML     Performed at [facility]   Culture     Final   Value: [place]     Performed at [facility]   Report Status PENDING   Incomplete  CULTURE, BLOOD (ROUTINE X 2)     Status: None   Collection Time    07/22/14  10:40 AM      Result Value Ref Range Status   Specimen Description BLOOD RIGHT ARM   Final   Special Requests BOTTLES DRAWN AEROBIC ONLY 3CC   Final   Culture  Setup Time     Final   Value: 07/22/2014 18:04     Performed at [facility]   Culture     Final   Value:        BLOOD CULTURE RECEIVED NO GROWTH TO DATE CULTURE WILL BE HELD FOR 5 DAYS BEFORE ISSUING A FINAL NEGATIVE REPORT     Performed at [facility]   Report Status PENDING   Incomplete  CULTURE, BLOOD (  ROUTINE X 2)     Status: None   Collection Time    07/22/14 10:50 AM      Result Value Ref Range Status   Specimen Description BLOOD RIGHT HAND   Final   Special Requests BOTTLES DRAWN AEROBIC ONLY 2CC   Final   Culture  Setup Time     Final   Value: 07/22/2014 18:04     Performed at [facility]   Culture     Final   Value:        BLOOD CULTURE RECEIVED NO GROWTH TO DATE CULTURE WILL BE HELD FOR 5 DAYS BEFORE ISSUING A FINAL NEGATIVE REPORT     Performed at [facility]   Report Status PENDING   Incomplete    RADIOLOGY STUDIES/RESULTS: Ct Abdomen Pelvis Wo Contrast  07/18/2014   CLINICAL DATA:  Increased weakness. Acute sepsis. Increasing lack today.  EXAM: CT ABDOMEN AND PELVIS WITHOUT CONTRAST  TECHNIQUE: Multidetector CT imaging of the abdomen and pelvis was performed following the standard protocol without IV contrast.  COMPARISON:  07/21/2011  FINDINGS: Small bilateral pleural effusions with atelectasis or infiltration in both lung bases. Coronary artery and aortic valve calcifications.  The left kidney is significantly enlarged in comparison to the right. There is left hydronephrosis and hydroureter. Stranding around the left kidney in ureter. No ureteral stones are demonstrated but 2 stones are shown in the bladder. There are multiple intrarenal stones in the left renal pelvis and lower pole right kidney. Additional calcifications in both kidneys are probably vascular. Low-attenuation  lesions arising from the upper pole of the left kidney consistent with cysts and similar to previous study.  Surgical absence of the gallbladder. Mild bile duct dilatation. Vague increased density at the area of the distal common bile duct may represent an intraductal stone. The unenhanced appearance of the liver, spleen, pancreas, adrenal glands, inferior vena cava, and retroperitoneal lymph nodes is unremarkable. Vascular calcifications in the abdominal aorta without aneurysm. Stomach, small bowel, and colon are not abnormally distended. There is a broad-based anterior abdominal Shupert hernia which contains bowel but without evidence of compromise. No free air or free fluid in the abdomen.  Pelvis: Small amount of free fluid in the pelvis is nonspecific but likely reactive. Foley catheter decompresses the bladder. No evidence of diverticulitis. Appendix is probably surgically absent. Degenerative changes in the lumbar spine. No destructive bone lesions appreciated.  IMPRESSION: Moderate obstruction of the left kidney and ureter. Bladder stones are present, probably representing recently passed stones. No definite distal ureteral stones otherwise identified. Bilateral intrarenal stones. Stones in the left renal pelvis. Foley catheter in the bladder. Small amount of free fluid in the pelvis.   Electronically Signed   By: Lucienne Capers M.D.   On: 07/18/2014 04:42   Dg Chest Port 1 View  07/18/2014   CLINICAL DATA:  Central line placement.  EXAM: PORTABLE CHEST - 1 VIEW  COMPARISON:  07/18/2014  FINDINGS: The right IJ central venous catheter tip is in the distal SVC approximately 2 cm above the cavoatrial junction. No complicating features. The heart and lungs are stable. No pneumothorax.  IMPRESSION: Right IJ central venous catheter tip is in the distal SVC. No complicating features.   Electronically Signed   By: Kalman Jewels M.D.   On: 07/18/2014 12:04   Dg Chest Port 1 View  07/18/2014   CLINICAL DATA:   Sepsis, shortness of breath, personal history of multiple sclerosis, peripheral vascular disease, TIA, pulmonary  fibrosis, hypertension, coronary artery disease  EXAM: PORTABLE CHEST - 1 VIEW  COMPARISON:  Portable exam 0441 hr compared to 07/17/2014  FINDINGS: Normal heart size, mediastinal contours and pulmonary vascularity.  Atherosclerotic calcification aorta.  Mild RIGHT basilar atelectasis.  Atelectasis versus infiltrate developing in LEFT lower lobe.  Remaining lungs clear.  No definite pleural effusion or pneumothorax.  IMPRESSION: Mild RIGHT basilar atelectasis with developing infiltrate versus atelectasis in LEFT lower lobe.   Electronically Signed   By: Lavonia Dana M.D.   On: 07/18/2014 07:48   Dg Chest Port 1 View  07/17/2014   CLINICAL DATA:  Recurrent urinary tract infections.  Fever.  EXAM: PORTABLE CHEST - 1 VIEW  COMPARISON:  November 10, 2011.  FINDINGS: The heart size and mediastinal contours are within normal limits. Both lungs are clear. No pneumothorax or pleural effusion is noted. The visualized skeletal structures are unremarkable.  IMPRESSION: No acute cardiopulmonary abnormality seen.   Electronically Signed   By: Sabino Dick M.D.   On: 07/17/2014 13:48   Dg Abd Portable 1v  07/17/2014   CLINICAL DATA:  73 year old male with acute abdominal pain. Initial encounter.  EXAM: PORTABLE ABDOMEN - 1 VIEW  COMPARISON:  CT Abdomen and Pelvis 07/21/2011.  FINDINGS: Portable AP supine view at 2237 hrs. Calcified atherosclerosis including bilateral renal hilar calcifications. Superimposed nephrolithiasis. Stable cholecystectomy clips. Mild respiratory motion. Non obstructed bowel gas pattern. No acute osseous abnormality identified.  IMPRESSION: 1.  Non obstructed bowel gas pattern. 2. Chronic nephrolithiasis and vascular calcified atherosclerosis.   Electronically Signed   By: Lars Pinks M.D.   On: 07/17/2014 22:55   Dg Swallowing Func-speech Pathology  07/20/2014   Selinda Orion Page, CCC-SLP      07/20/2014  4:15 PM Objective Swallowing Evaluation: Modified barium swallow  evaluation  Patient Details  Name: Jordan Ward MRN: [id number] Date of Birth: 1940-10-27  Today's Date: 07/20/2014 Time: 1030-1100 SLP Time Calculation (min): 30 min  Past Medical History:  Past Medical History  Diagnosis Date  . Arthritis   . MS (multiple sclerosis)   . Insomnia   . PVD (peripheral vascular disease)   . Carotid artery stenosis   . TIA (transient ischemic attack)   . Chronic indwelling Foley catheter 10/06/2011  . Pulmonary fibrosis 10/06/2011  . Dysphagia 10/07/2011  . Tremors of nervous system 10/08/2011  . Hypothyroidism 10/08/2011  . Pulmonary nodule 10/08/2011  . C. difficile colitis 09/2011  . Pneumonia   . Encephalopathy   . Urinary tract infection   . Tremors of nervous system   . Hypokalemia   . Junctional rhythm   . Anemia   . Hypernatremia   . Sacral ulcer   . HTN (hypertension), malignant 10/06/2011  . Sleep apnea   . Neuromuscular disorder   . Back pain, chronic   . Kidney stones   . High grade dysplasia in colonic adenoma 09/2005  . Gait disorder   . OSA (obstructive sleep apnea)   . Paroxysmal atrial tachycardia   . Dyslipidemia   . Cerebrovascular disease   . CAD (coronary artery disease)   . Peripheral vascular disease   . Bilateral carotid bruits   . Colon polyps   . HA (headache)    Past Surgical History:  Past Surgical History  Procedure Laterality Date  . Inguinal hernia repair  1971    bilateral  . Back surgery  1976/1979  . Colonoscopy  11/2004    Dr. Sharol Roussel sessile polyp splenic flexure, 48mm sessile  polyp desc colon, tubulovillous adenoma (bx not removed)  . Colonoscopy  01/2005    poor prep, polyp could not be found  . Colonoscopy  05/2005    with EMR, polypectomy Dr. Olegario Messier, bx showed high grade  dysplasia, partially resected  . Colonoscopy  09/2005    Dr. Arsenio Loader, [place] ink tattooing, four villous colon polyp (3  had been missed on previous colonoscopies due to limitations of  procedures  . Colon  surgery  09/2005    Fleishman: four tubular adenomas, large adenomatous polyp with  HIGH GRADE dysplasia  . Appendectomy  09/2005    at time of left hemicolectomy  . Colonoscopy  09/2006    normal TI, no polyps  . Colonoscopy  10/2007    Dr. Imogene Burn distal mammillations, benign bx, normal TI,  random bx neg for microscopic colitis  . Cholecystectomy      Dr. Tamala Julian  . Suprapubic catheter insertion     HPI:   73 y.o. male with hx of long-standing MS brought to [facility] ED on  10/19 with slurred speech. Dx of severe sepsis. Pt has a hx of  variable neurogenic dysphagia. Last MBS studies were at [facility] in  Jan and Feb of 2013, the latter of which recommended  nectar-liquids/modified diet secondary to aspiration. Per report,  dysphagia resolved and pt has been eating a regular diet with  thin liquids.  Pt initiated on Dys 3 solids and honey thick  liquids on 10/21 due to overt s/s of aspiration noted at bedside.   MBS ordered 10/22 to objectively determine safest diet  consistencies.       Assessment / Plan / Recommendation Clinical Impression  Dysphagia Diagnosis: Mild pharyngeal phase dysphagia;Moderate  pharyngeal phase dysphagia;Mild oral phase dysphagia Pt presents with a moderate multifactorial dysphagia with both  sensory and motor impairments.  Pt presents with generalized oral  motor weakness which resulted in oral residue post swallow and  prolonged transit of materials into the oropharynx.  Pt exhibited  a delayed swallow response due to decreased pharyngeal sensation  and overall poor control of boluses which resulted in silent  aspiration of large cup sips of nectar thick liquids.  Attempts  to better control boluses with small cup sips or teaspoons of  nectar thick liquids slightly improved airway protection but  still resulted in silent penetration.  Additional pharyngeal  phase impairments were characterized by decreased base of tongue  retraction, poor tongue to palate contact, and decreased  laryngeal  elevation which resulted in inconsistent amounts of  pharyngeal residue across all consistencies.  SLP also suspects  some component of esophageal dysphagia impacting pharyngeal  residue due to pooling of materials at the UES and intermittent  backflow of materials into the cervical esophagus.  The  abovementioned functional impairments appeared to resolve with  increased time between bites/sips Therefore, recommend pt  continue on Dys 3 diet with honey thick liquids, meds crushed in  puree, full supervision for use of the following swallowing  precautions: slow rate, small bites/sips, and alternate solids  and liquids.  Prognosis for advancement good with trials of  advanced consistencies and pharyngeal strengthening exercises.   Recommend repeat MBS to objectively determine readiness for diet  advancement.     Treatment Recommendation  Therapy as outlined in treatment plan below    Diet Recommendation Honey-thick liquid;Dysphagia 3 (Mechanical  Soft)   Liquid Administration via: Cup Medication Administration: Crushed with puree Supervision: Patient able to self feed;Full supervision/cueing  for  compensatory strategies Compensations: Slow rate;Small sips/bites;Follow solids with  liquid Postural Changes and/or Swallow Maneuvers: Seated upright 90  degrees    Other  Recommendations Oral Care Recommendations: Oral care BID   Follow Up Recommendations   (TBD)    Frequency and Duration min 2x/week  1 week   Pertinent Vitals/Pain n/a     General Date of Onset: 07/17/14 Reason for Referral: Objectively evaluate swallowing function Previous Swallow Assessment: 2/13 Diet Prior to this Study: Dysphagia 3 (soft);Honey-thick liquids Respiratory Status: Room air Behavior/Cognition: Alert Oral Cavity - Dentition: Adequate natural dentition Oral Motor / Sensory Function: Impaired - see Bedside swallow  eval Baseline Vocal Quality: Low vocal intensity Volitional Cough: Strong Volitional Swallow: Unable to elicit Anatomy: Within  functional limits Pharyngeal Secretions: Not observed secondary MBS    Reason for Referral Objectively evaluate swallowing function   Oral Phase Oral Preparation/Oral Phase Oral Phase: Impaired Oral - Honey Oral - Honey Cup: Reduced posterior propulsion;Delayed oral  transit;Lingual pumping Oral - Nectar Oral - Nectar Teaspoon: Lingual pumping;Reduced posterior  propulsion;Delayed oral transit Oral - Nectar Cup: Lingual pumping;Reduced posterior  propulsion;Delayed oral transit Oral - Solids Oral - Puree: Delayed oral transit;Reduced posterior  propulsion;Lingual pumping Oral - Mechanical Soft: Weak lingual manipulation;Impaired  mastication;Reduced posterior propulsion;Delayed oral  transit;Lingual pumping;Lingual/palatal residue   Pharyngeal Phase Pharyngeal - Honey Pharyngeal - Honey Cup: Delayed swallow initiation;Premature  spillage to valleculae;Pharyngeal residue - valleculae;Reduced  tongue base retraction;Reduced laryngeal elevation;Pharyngeal  residue - pyriform sinuses;Pharyngeal residue - cp segment Pharyngeal - Nectar Pharyngeal - Nectar Teaspoon: Delayed swallow  initiation;Penetration/Aspiration during swallow;Pharyngeal  residue - pyriform sinuses;Reduced tongue base  retraction;Pharyngeal residue - valleculae;Reduced laryngeal  elevation;Pharyngeal residue - cp segment Penetration/Aspiration details (nectar teaspoon): Material enters  airway, remains ABOVE vocal cords and not ejected out Pharyngeal - Nectar Cup: Delayed swallow initiation;Reduced  laryngeal elevation;Reduced tongue base  retraction;Penetration/Aspiration during swallow;Pharyngeal  residue - pyriform sinuses;Pharyngeal residue - cp  segment;Pharyngeal residue - posterior pharnyx Penetration/Aspiration details (nectar cup): Material enters  airway, passes BELOW cords without attempt by patient to eject  out (silent aspiration);Material enters airway, remains ABOVE  vocal cords and not ejected out Pharyngeal - Solids Pharyngeal -  Puree: Delayed swallow initiation;Premature spillage  to valleculae;Pharyngeal residue - valleculae;Pharyngeal residue  - cp segment;Pharyngeal residue - pyriform sinuses;Reduced tongue  base retraction;Reduced laryngeal elevation Pharyngeal - Mechanical Soft: Delayed swallow  initiation;Premature spillage to valleculae;Reduced laryngeal  elevation;Reduced tongue base retraction;Pharyngeal residue -  valleculae;Pharyngeal residue - posterior pharnyx;Pharyngeal  residue - pyriform sinuses;Pharyngeal residue - cp segment  Cervical Esophageal Phase    GO    Cervical Esophageal Phase Cervical Esophageal Phase: Impaired Cervical Esophageal Phase - Comment Cervical Esophageal Comment: intermittently decreased movement of  materials through the UES which is likely related to decreased  hyolaryngeal excursion and increased esophageal pressure with  intermittent backflow into the cervical  esophagus         Page, Selinda Orion 07/20/2014, 4:14 PM     Oren Binet, MD  [facility] Pager:[phone]  If 7PM-7AM, please contact night-coverage [email or web address] Password [email or web address] 07/25/2014, 10:58 AM   LOS: 8 days

## 2014-07-25 NOTE — Progress Notes (Signed)
SUBJECTIVE:  Denies chest pain or SOB  OBJECTIVE:   Vitals:   Filed Vitals:   07/24/14 0535 07/24/14 1330 07/24/14 2156 07/25/14 0619  BP: 106/61 116/72 98/62 120/63  Pulse: 67 68 72 70  Temp: 98.2 F (36.8 C) 98 F (36.7 C) 97.7 F (36.5 C) 99.4 F (37.4 C)  TempSrc: Oral Oral Oral Oral  Resp: 20 18 18 18   Height:      Weight:      SpO2: 100% 100% 100% 99%   I&O's:   Intake/Output Summary (Last 24 hours) at 07/25/14 [id number] Last data filed at 07/25/14 [id number]  Gross per 24 hour  Intake 2126.5 ml  Output   4025 ml  Net -1898.5 ml   TELEMETRY: Reviewed telemetry pt in NSR:     PHYSICAL EXAM General: Well developed, well nourished, in no acute distress Head: Eyes PERRLA, No xanthomas.   Normal cephalic and atramatic  Lungs:   Clear bilaterally to auscultation and percussion. Heart:   HRRR S1 S2 Pulses are 2+ & equal. Abdomen: Bowel sounds are positive, abdomen soft and non-tender without masses Extremities:   No clubbing, cyanosis or edema.  DP +1 Neuro: Alert and oriented X 3. Psych:  Good affect, responds appropriately   LABS: Basic Metabolic Panel:  Recent Labs  07/24/14 0420 07/25/14 0646  NA 158* 152*  K 3.3* 3.7  CL 123* 117*  CO2 25 23  GLUCOSE 106* 110*  BUN 29* 23  CREATININE 1.87* 1.74*  CALCIUM 7.8* 7.9*   Liver Function Tests: No results found for this basename: AST, ALT, ALKPHOS, BILITOT, PROT, ALBUMIN,  in the last 72 hours No results found for this basename: LIPASE, AMYLASE,  in the last 72 hours CBC:  Recent Labs  07/23/14 0609 07/25/14 0646  WBC 17.3* 17.0*  HGB 10.3* 9.9*  HCT 33.3* 32.0*  MCV 94.6 96.1  PLT 111* 127*   Cardiac Enzymes:  Recent Labs  07/22/14 1400  TROPONINI 0.72*   BNP: No components found with this basename: POCBNP,  D-Dimer: No results found for this basename: DDIMER,  in the last 72 hours Hemoglobin A1C: No results found for this basename: HGBA1C,  in the last 72 hours Fasting Lipid Panel: No  results found for this basename: CHOL, HDL, LDLCALC, TRIG, CHOLHDL, LDLDIRECT,  in the last 72 hours Thyroid Function Tests: No results found for this basename: TSH, T4TOTAL, FREET3, T3FREE, THYROIDAB,  in the last 72 hours Anemia Panel: No results found for this basename: VITAMINB12, FOLATE, FERRITIN, TIBC, IRON, RETICCTPCT,  in the last 72 hours Coag Panel:   Lab Results  Component Value Date   INR 1.68* 07/18/2014   INR 1.96* 07/18/2014   INR 1.61* 07/17/2014    RADIOLOGY: Ct Abdomen Pelvis Wo Contrast  07/18/2014   CLINICAL DATA:  Increased weakness. Acute sepsis. Increasing lack today.  EXAM: CT ABDOMEN AND PELVIS WITHOUT CONTRAST  TECHNIQUE: Multidetector CT imaging of the abdomen and pelvis was performed following the standard protocol without IV contrast.  COMPARISON:  07/21/2011  FINDINGS: Small bilateral pleural effusions with atelectasis or infiltration in both lung bases. Coronary artery and aortic valve calcifications.  The left kidney is significantly enlarged in comparison to the right. There is left hydronephrosis and hydroureter. Stranding around the left kidney in ureter. No ureteral stones are demonstrated but 2 stones are shown in the bladder. There are multiple intrarenal stones in the left renal pelvis and lower pole right kidney. Additional calcifications in both kidneys are probably  vascular. Low-attenuation lesions arising from the upper pole of the left kidney consistent with cysts and similar to previous study.  Surgical absence of the gallbladder. Mild bile duct dilatation. Vague increased density at the area of the distal common bile duct may represent an intraductal stone. The unenhanced appearance of the liver, spleen, pancreas, adrenal glands, inferior vena cava, and retroperitoneal lymph nodes is unremarkable. Vascular calcifications in the abdominal aorta without aneurysm. Stomach, small bowel, and colon are not abnormally distended. There is a broad-based anterior  abdominal Mcginty hernia which contains bowel but without evidence of compromise. No free air or free fluid in the abdomen.  Pelvis: Small amount of free fluid in the pelvis is nonspecific but likely reactive. Foley catheter decompresses the bladder. No evidence of diverticulitis. Appendix is probably surgically absent. Degenerative changes in the lumbar spine. No destructive bone lesions appreciated.  IMPRESSION: Moderate obstruction of the left kidney and ureter. Bladder stones are present, probably representing recently passed stones. No definite distal ureteral stones otherwise identified. Bilateral intrarenal stones. Stones in the left renal pelvis. Foley catheter in the bladder. Small amount of free fluid in the pelvis.   Electronically Signed   By: Lucienne Capers M.D.   On: 07/18/2014 04:42   Dg Chest Port 1 View  07/18/2014   CLINICAL DATA:  Central line placement.  EXAM: PORTABLE CHEST - 1 VIEW  COMPARISON:  07/18/2014  FINDINGS: The right IJ central venous catheter tip is in the distal SVC approximately 2 cm above the cavoatrial junction. No complicating features. The heart and lungs are stable. No pneumothorax.  IMPRESSION: Right IJ central venous catheter tip is in the distal SVC. No complicating features.   Electronically Signed   By: Kalman Jewels M.D.   On: 07/18/2014 12:04   Dg Chest Port 1 View  07/18/2014   CLINICAL DATA:  Sepsis, shortness of breath, personal history of multiple sclerosis, peripheral vascular disease, TIA, pulmonary fibrosis, hypertension, coronary artery disease  EXAM: PORTABLE CHEST - 1 VIEW  COMPARISON:  Portable exam 0441 hr compared to 07/17/2014  FINDINGS: Normal heart size, mediastinal contours and pulmonary vascularity.  Atherosclerotic calcification aorta.  Mild RIGHT basilar atelectasis.  Atelectasis versus infiltrate developing in LEFT lower lobe.  Remaining lungs clear.  No definite pleural effusion or pneumothorax.  IMPRESSION: Mild RIGHT basilar atelectasis  with developing infiltrate versus atelectasis in LEFT lower lobe.   Electronically Signed   By: Lavonia Dana M.D.   On: 07/18/2014 07:48   Dg Chest Port 1 View  07/17/2014   CLINICAL DATA:  Recurrent urinary tract infections.  Fever.  EXAM: PORTABLE CHEST - 1 VIEW  COMPARISON:  November 10, 2011.  FINDINGS: The heart size and mediastinal contours are within normal limits. Both lungs are clear. No pneumothorax or pleural effusion is noted. The visualized skeletal structures are unremarkable.  IMPRESSION: No acute cardiopulmonary abnormality seen.   Electronically Signed   By: Sabino Dick M.D.   On: 07/17/2014 13:48   Dg Abd Portable 1v  07/17/2014   CLINICAL DATA:  73 year old male with acute abdominal pain. Initial encounter.  EXAM: PORTABLE ABDOMEN - 1 VIEW  COMPARISON:  CT Abdomen and Pelvis 07/21/2011.  FINDINGS: Portable AP supine view at 2237 hrs. Calcified atherosclerosis including bilateral renal hilar calcifications. Superimposed nephrolithiasis. Stable cholecystectomy clips. Mild respiratory motion. Non obstructed bowel gas pattern. No acute osseous abnormality identified.  IMPRESSION: 1.  Non obstructed bowel gas pattern. 2. Chronic nephrolithiasis and vascular calcified atherosclerosis.   Electronically  Signed   By: Lars Pinks M.D.   On: 07/17/2014 22:55   Dg Swallowing Func-speech Pathology  07/20/2014   Selinda Orion Page, CCC-SLP     07/20/2014  4:15 PM Objective Swallowing Evaluation: Modified barium swallow  evaluation  Patient Details  Name: BARAK FRIAR MRN: [id number] Date of Birth: May 15, 1941  Today's Date: 07/20/2014 Time: 1030-1100 SLP Time Calculation (min): 30 min  Past Medical History:  Past Medical History  Diagnosis Date  . Arthritis   . MS (multiple sclerosis)   . Insomnia   . PVD (peripheral vascular disease)   . Carotid artery stenosis   . TIA (transient ischemic attack)   . Chronic indwelling Foley catheter 10/06/2011  . Pulmonary fibrosis 10/06/2011  . Dysphagia 10/07/2011  . Tremors of  nervous system 10/08/2011  . Hypothyroidism 10/08/2011  . Pulmonary nodule 10/08/2011  . C. difficile colitis 09/2011  . Pneumonia   . Encephalopathy   . Urinary tract infection   . Tremors of nervous system   . Hypokalemia   . Junctional rhythm   . Anemia   . Hypernatremia   . Sacral ulcer   . HTN (hypertension), malignant 10/06/2011  . Sleep apnea   . Neuromuscular disorder   . Back pain, chronic   . Kidney stones   . High grade dysplasia in colonic adenoma 09/2005  . Gait disorder   . OSA (obstructive sleep apnea)   . Paroxysmal atrial tachycardia   . Dyslipidemia   . Cerebrovascular disease   . CAD (coronary artery disease)   . Peripheral vascular disease   . Bilateral carotid bruits   . Colon polyps   . HA (headache)    Past Surgical History:  Past Surgical History  Procedure Laterality Date  . Inguinal hernia repair  1971    bilateral  . Back surgery  1976/1979  . Colonoscopy  11/2004    Dr. Sharol Roussel sessile polyp splenic flexure, 29mm sessile  polyp desc colon, tubulovillous adenoma (bx not removed)  . Colonoscopy  01/2005    poor prep, polyp could not be found  . Colonoscopy  05/2005    with EMR, polypectomy Dr. Olegario Messier, bx showed high grade  dysplasia, partially resected  . Colonoscopy  09/2005    Dr. Arsenio Loader, [place] ink tattooing, four villous colon polyp (3  had been missed on previous colonoscopies due to limitations of  procedures  . Colon surgery  09/2005    Fleishman: four tubular adenomas, large adenomatous polyp with  HIGH GRADE dysplasia  . Appendectomy  09/2005    at time of left hemicolectomy  . Colonoscopy  09/2006    normal TI, no polyps  . Colonoscopy  10/2007    Dr. Imogene Burn distal mammillations, benign bx, normal TI,  random bx neg for microscopic colitis  . Cholecystectomy      Dr. Tamala Julian  . Suprapubic catheter insertion     HPI:   73 y.o. male with hx of long-standing MS brought to [facility] ED on  10/19 with slurred speech. Dx of severe sepsis. Pt has a hx of  variable neurogenic dysphagia. Last  MBS studies were at [facility] in  Jan and Feb of 2013, the latter of which recommended  nectar-liquids/modified diet secondary to aspiration. Per report,  dysphagia resolved and pt has been eating a regular diet with  thin liquids.  Pt initiated on Dys 3 solids and honey thick  liquids on 10/21 due to overt s/s of aspiration noted at bedside.  MBS ordered 10/22 to objectively determine safest diet  consistencies.       Assessment / Plan / Recommendation Clinical Impression  Dysphagia Diagnosis: Mild pharyngeal phase dysphagia;Moderate  pharyngeal phase dysphagia;Mild oral phase dysphagia Pt presents with a moderate multifactorial dysphagia with both  sensory and motor impairments.  Pt presents with generalized oral  motor weakness which resulted in oral residue post swallow and  prolonged transit of materials into the oropharynx.  Pt exhibited  a delayed swallow response due to decreased pharyngeal sensation  and overall poor control of boluses which resulted in silent  aspiration of large cup sips of nectar thick liquids.  Attempts  to better control boluses with small cup sips or teaspoons of  nectar thick liquids slightly improved airway protection but  still resulted in silent penetration.  Additional pharyngeal  phase impairments were characterized by decreased base of tongue  retraction, poor tongue to palate contact, and decreased  laryngeal elevation which resulted in inconsistent amounts of  pharyngeal residue across all consistencies.  SLP also suspects  some component of esophageal dysphagia impacting pharyngeal  residue due to pooling of materials at the UES and intermittent  backflow of materials into the cervical esophagus.  The  abovementioned functional impairments appeared to resolve with  increased time between bites/sips Therefore, recommend pt  continue on Dys 3 diet with honey thick liquids, meds crushed in  puree, full supervision for use of the following swallowing  precautions: slow rate, small  bites/sips, and alternate solids  and liquids.  Prognosis for advancement good with trials of  advanced consistencies and pharyngeal strengthening exercises.   Recommend repeat MBS to objectively determine readiness for diet  advancement.     Treatment Recommendation  Therapy as outlined in treatment plan below    Diet Recommendation Honey-thick liquid;Dysphagia 3 (Mechanical  Soft)   Liquid Administration via: Cup Medication Administration: Crushed with puree Supervision: Patient able to self feed;Full supervision/cueing  for compensatory strategies Compensations: Slow rate;Small sips/bites;Follow solids with  liquid Postural Changes and/or Swallow Maneuvers: Seated upright 90  degrees    Other  Recommendations Oral Care Recommendations: Oral care BID   Follow Up Recommendations   (TBD)    Frequency and Duration min 2x/week  1 week   Pertinent Vitals/Pain n/a     General Date of Onset: 07/17/14 Reason for Referral: Objectively evaluate swallowing function Previous Swallow Assessment: 2/13 Diet Prior to this Study: Dysphagia 3 (soft);Honey-thick liquids Respiratory Status: Room air Behavior/Cognition: Alert Oral Cavity - Dentition: Adequate natural dentition Oral Motor / Sensory Function: Impaired - see Bedside swallow  eval Baseline Vocal Quality: Low vocal intensity Volitional Cough: Strong Volitional Swallow: Unable to elicit Anatomy: Within functional limits Pharyngeal Secretions: Not observed secondary MBS    Reason for Referral Objectively evaluate swallowing function   Oral Phase Oral Preparation/Oral Phase Oral Phase: Impaired Oral - Honey Oral - Honey Cup: Reduced posterior propulsion;Delayed oral  transit;Lingual pumping Oral - Nectar Oral - Nectar Teaspoon: Lingual pumping;Reduced posterior  propulsion;Delayed oral transit Oral - Nectar Cup: Lingual pumping;Reduced posterior  propulsion;Delayed oral transit Oral - Solids Oral - Puree: Delayed oral transit;Reduced posterior  propulsion;Lingual pumping Oral  - Mechanical Soft: Weak lingual manipulation;Impaired  mastication;Reduced posterior propulsion;Delayed oral  transit;Lingual pumping;Lingual/palatal residue   Pharyngeal Phase Pharyngeal - Honey Pharyngeal - Honey Cup: Delayed swallow initiation;Premature  spillage to valleculae;Pharyngeal residue - valleculae;Reduced  tongue base retraction;Reduced laryngeal elevation;Pharyngeal  residue - pyriform sinuses;Pharyngeal residue - cp segment Pharyngeal - Nectar Pharyngeal - Nectar  Teaspoon: Delayed swallow  initiation;Penetration/Aspiration during swallow;Pharyngeal  residue - pyriform sinuses;Reduced tongue base  retraction;Pharyngeal residue - valleculae;Reduced laryngeal  elevation;Pharyngeal residue - cp segment Penetration/Aspiration details (nectar teaspoon): Material enters  airway, remains ABOVE vocal cords and not ejected out Pharyngeal - Nectar Cup: Delayed swallow initiation;Reduced  laryngeal elevation;Reduced tongue base  retraction;Penetration/Aspiration during swallow;Pharyngeal  residue - pyriform sinuses;Pharyngeal residue - cp  segment;Pharyngeal residue - posterior pharnyx Penetration/Aspiration details (nectar cup): Material enters  airway, passes BELOW cords without attempt by patient to eject  out (silent aspiration);Material enters airway, remains ABOVE  vocal cords and not ejected out Pharyngeal - Solids Pharyngeal - Puree: Delayed swallow initiation;Premature spillage  to valleculae;Pharyngeal residue - valleculae;Pharyngeal residue  - cp segment;Pharyngeal residue - pyriform sinuses;Reduced tongue  base retraction;Reduced laryngeal elevation Pharyngeal - Mechanical Soft: Delayed swallow  initiation;Premature spillage to valleculae;Reduced laryngeal  elevation;Reduced tongue base retraction;Pharyngeal residue -  valleculae;Pharyngeal residue - posterior pharnyx;Pharyngeal  residue - pyriform sinuses;Pharyngeal residue - cp segment  Cervical Esophageal Phase    GO    Cervical Esophageal Phase  Cervical Esophageal Phase: Impaired Cervical Esophageal Phase - Comment Cervical Esophageal Comment: intermittently decreased movement of  materials through the UES which is likely related to decreased  hyolaryngeal excursion and increased esophageal pressure with  intermittent backflow into the cervical  esophagus         Page, Selinda Orion 07/20/2014, 4:14 PM     ASSESSMENT AND PLAN:  1. Elevated troponin in the context of urosepsis/demand ischemia: Given development of precordial/anterior T wave inversions on 10/24, suspicious that he may have LAD lesion. Troponins peaked at 4.93 on 10/20, down to 0.72 on 10/24. EF 30-35%. Denies chest pain. Continue ASA and Coreg. Given recent elevation of transaminases, will hold off on addition of statin. No cath due to acute renal failure/urosepsis due to high risk of contrast-induced nephropathy. Would proceed with nuclear MPI study today  2. Ischemic cardiomyopathy, EF 30-35%: No evidence of heart failure at present. Continue Coreg, Imdur, and hydralazine for optimization of intracardiac hemodynamics. No ACEI/ARB due to renal failure.  3. Urosepsis: On Primaxin. WBC count elevated from yesterday.  4. Essential HTN: On amlodipine. BP stable. Specifically, not hypotensive in context of urosepsis.  5. Hypokalemia - repleted  Sueanne Margarita, MD  07/25/2014  9:27 AM

## 2014-07-25 NOTE — Progress Notes (Signed)
The patient tolerated the Lexiscan very well.  Yoan Sallade, PAC

## 2014-07-26 ENCOUNTER — Other Ambulatory Visit: Payer: Self-pay | Admitting: *Deleted

## 2014-07-26 ENCOUNTER — Inpatient Hospital Stay
Admission: RE | Admit: 2014-07-26 | Discharge: 2014-09-15 | Disposition: A | Discharge status: Home / Self Care | Payer: BLUE CROSS/BLUE SHIELD | Source: Ambulatory Visit | Attending: Internal Medicine | Admitting: Internal Medicine

## 2014-07-26 DIAGNOSIS — I214 Non-ST elevation (NSTEMI) myocardial infarction: Secondary | ICD-10-CM | POA: Diagnosis not present

## 2014-07-26 DIAGNOSIS — Z9889 Other specified postprocedural states: Secondary | ICD-10-CM | POA: Diagnosis not present

## 2014-07-26 DIAGNOSIS — N182 Chronic kidney disease, stage 2 (mild): Secondary | ICD-10-CM | POA: Diagnosis not present

## 2014-07-26 DIAGNOSIS — D649 Anemia, unspecified: Secondary | ICD-10-CM | POA: Diagnosis not present

## 2014-07-26 DIAGNOSIS — A047 Enterocolitis due to Clostridium difficile: Secondary | ICD-10-CM | POA: Diagnosis not present

## 2014-07-26 DIAGNOSIS — I420 Dilated cardiomyopathy: Secondary | ICD-10-CM | POA: Diagnosis not present

## 2014-07-26 DIAGNOSIS — E038 Other specified hypothyroidism: Secondary | ICD-10-CM | POA: Diagnosis not present

## 2014-07-26 DIAGNOSIS — N179 Acute kidney failure, unspecified: Secondary | ICD-10-CM | POA: Diagnosis not present

## 2014-07-26 DIAGNOSIS — N184 Chronic kidney disease, stage 4 (severe): Secondary | ICD-10-CM | POA: Diagnosis not present

## 2014-07-26 DIAGNOSIS — R488 Other symbolic dysfunctions: Secondary | ICD-10-CM | POA: Diagnosis not present

## 2014-07-26 DIAGNOSIS — R278 Other lack of coordination: Secondary | ICD-10-CM | POA: Diagnosis not present

## 2014-07-26 DIAGNOSIS — E870 Hyperosmolality and hypernatremia: Secondary | ICD-10-CM | POA: Diagnosis not present

## 2014-07-26 DIAGNOSIS — R1310 Dysphagia, unspecified: Secondary | ICD-10-CM | POA: Diagnosis not present

## 2014-07-26 DIAGNOSIS — I10 Essential (primary) hypertension: Secondary | ICD-10-CM | POA: Diagnosis not present

## 2014-07-26 DIAGNOSIS — N200 Calculus of kidney: Secondary | ICD-10-CM | POA: Diagnosis not present

## 2014-07-26 DIAGNOSIS — I9589 Other hypotension: Secondary | ICD-10-CM | POA: Diagnosis not present

## 2014-07-26 DIAGNOSIS — R1314 Dysphagia, pharyngoesophageal phase: Secondary | ICD-10-CM | POA: Diagnosis present

## 2014-07-26 DIAGNOSIS — E039 Hypothyroidism, unspecified: Secondary | ICD-10-CM | POA: Diagnosis not present

## 2014-07-26 DIAGNOSIS — K225 Diverticulum of esophagus, acquired: Secondary | ICD-10-CM | POA: Diagnosis not present

## 2014-07-26 DIAGNOSIS — N185 Chronic kidney disease, stage 5: Secondary | ICD-10-CM | POA: Diagnosis not present

## 2014-07-26 DIAGNOSIS — R6520 Severe sepsis without septic shock: Secondary | ICD-10-CM | POA: Diagnosis not present

## 2014-07-26 DIAGNOSIS — G35 Multiple sclerosis: Secondary | ICD-10-CM | POA: Diagnosis not present

## 2014-07-26 DIAGNOSIS — F0633 Mood disorder due to known physiological condition with manic features: Secondary | ICD-10-CM | POA: Diagnosis not present

## 2014-07-26 DIAGNOSIS — F05 Delirium due to known physiological condition: Secondary | ICD-10-CM | POA: Diagnosis not present

## 2014-07-26 DIAGNOSIS — S91001A Unspecified open wound, right ankle, initial encounter: Secondary | ICD-10-CM | POA: Diagnosis not present

## 2014-07-26 DIAGNOSIS — R262 Difficulty in walking, not elsewhere classified: Secondary | ICD-10-CM | POA: Diagnosis not present

## 2014-07-26 DIAGNOSIS — F419 Anxiety disorder, unspecified: Secondary | ICD-10-CM | POA: Diagnosis not present

## 2014-07-26 DIAGNOSIS — N189 Chronic kidney disease, unspecified: Secondary | ICD-10-CM | POA: Diagnosis not present

## 2014-07-26 DIAGNOSIS — M6281 Muscle weakness (generalized): Secondary | ICD-10-CM | POA: Diagnosis not present

## 2014-07-26 DIAGNOSIS — R609 Edema, unspecified: Secondary | ICD-10-CM | POA: Diagnosis not present

## 2014-07-26 DIAGNOSIS — N289 Disorder of kidney and ureter, unspecified: Secondary | ICD-10-CM | POA: Diagnosis not present

## 2014-07-26 DIAGNOSIS — R21 Rash and other nonspecific skin eruption: Secondary | ICD-10-CM | POA: Diagnosis not present

## 2014-07-26 DIAGNOSIS — A419 Sepsis, unspecified organism: Secondary | ICD-10-CM | POA: Diagnosis not present

## 2014-07-26 DIAGNOSIS — R195 Other fecal abnormalities: Secondary | ICD-10-CM | POA: Diagnosis not present

## 2014-07-26 DIAGNOSIS — N183 Chronic kidney disease, stage 3 (moderate): Secondary | ICD-10-CM | POA: Diagnosis not present

## 2014-07-26 DIAGNOSIS — N390 Urinary tract infection, site not specified: Secondary | ICD-10-CM | POA: Diagnosis not present

## 2014-07-26 DIAGNOSIS — I129 Hypertensive chronic kidney disease with stage 1 through stage 4 chronic kidney disease, or unspecified chronic kidney disease: Secondary | ICD-10-CM | POA: Diagnosis not present

## 2014-07-26 DIAGNOSIS — N2889 Other specified disorders of kidney and ureter: Principal | ICD-10-CM

## 2014-07-26 LAB — URINE CULTURE

## 2014-07-26 LAB — CBC
HCT: 30.2 % — ABNORMAL LOW (ref 39.0–52.0)
Hemoglobin: 9.4 g/dL — ABNORMAL LOW (ref 13.0–17.0)
MCH: 29.5 pg (ref 26.0–34.0)
MCHC: 31.1 g/dL (ref 30.0–36.0)
MCV: 94.7 fL (ref 78.0–100.0)
Platelets: 145 10*3/uL — ABNORMAL LOW (ref 150–400)
RBC: 3.19 MIL/uL — ABNORMAL LOW (ref 4.22–5.81)
RDW: 16.8 % — ABNORMAL HIGH (ref 11.5–15.5)
WBC: 12.8 10*3/uL — AB (ref 4.0–10.5)

## 2014-07-26 LAB — BASIC METABOLIC PANEL
ANION GAP: 12 (ref 5–15)
BUN: 20 mg/dL (ref 6–23)
CALCIUM: 7.9 mg/dL — AB (ref 8.4–10.5)
CHLORIDE: 114 meq/L — AB (ref 96–112)
CO2: 23 mEq/L (ref 19–32)
Creatinine, Ser: 1.6 mg/dL — ABNORMAL HIGH (ref 0.50–1.35)
GFR calc Af Amer: 48 mL/min — ABNORMAL LOW (ref >90)
GFR calc non Af Amer: 42 mL/min — ABNORMAL LOW (ref >90)
Glucose, Bld: 95 mg/dL (ref 70–99)
Potassium: 3.5 mEq/L — ABNORMAL LOW (ref 3.7–5.3)
Sodium: 149 mEq/L — ABNORMAL HIGH (ref 137–147)

## 2014-07-26 MED ORDER — SODIUM CHLORIDE 0.9 % IJ SOLN
10.0000 mL | INTRAMUSCULAR | Status: DC | PRN
  Administered 2014-07-26: 10 mL

## 2014-07-26 MED ORDER — DIAZEPAM 5 MG PO TABS: ORAL_TABLET | ORAL | Status: DC

## 2014-07-26 MED ORDER — HEPARIN SOD (PORK) LOCK FLUSH 100 UNIT/ML IV SOLN
250.0000 [IU] | INTRAVENOUS | Status: AC | PRN
  Administered 2014-07-26: 250 [IU]

## 2014-07-26 MED ORDER — HYDRALAZINE HCL 10 MG PO TABS: 10.0000 mg | ORAL_TABLET | Freq: Three times a day (TID) | ORAL | Status: DC

## 2014-07-26 MED ORDER — HYDROCODONE-ACETAMINOPHEN 7.5-325 MG PO TABS: 1.0000 | ORAL_TABLET | Freq: Two times a day (BID) | ORAL | Status: DC

## 2014-07-26 MED ORDER — ASPIRIN 81 MG PO CHEW: 81.0000 mg | CHEWABLE_TABLET | Freq: Every day | ORAL | Status: DC

## 2014-07-26 MED ORDER — CARVEDILOL 6.25 MG PO TABS: 6.2500 mg | ORAL_TABLET | Freq: Two times a day (BID) | ORAL | Status: DC

## 2014-07-26 MED ORDER — DIAZEPAM 5 MG PO TABS: 5.0000 mg | ORAL_TABLET | Freq: Two times a day (BID) | ORAL | Status: DC

## 2014-07-26 MED ORDER — ISOSORBIDE MONONITRATE ER 30 MG PO TB24: 30.0000 mg | ORAL_TABLET | Freq: Every day | ORAL | Status: DC

## 2014-07-26 MED ORDER — ATORVASTATIN CALCIUM 40 MG PO TABS: 40.0000 mg | ORAL_TABLET | Freq: Every day | ORAL | Status: DC

## 2014-07-26 MED ORDER — SODIUM CHLORIDE 0.9 % IV SOLN: 250.0000 mg | Freq: Three times a day (TID) | INTRAVENOUS | Status: DC

## 2014-07-26 NOTE — Progress Notes (Signed)
Peripherally Inserted Central Catheter/Midline Placement  The IV Nurse has discussed with the patient and/or persons authorized to consent for the patient, the purpose of this procedure and the potential benefits and risks involved with this procedure.  The benefits include less needle sticks, lab draws from the catheter and patient may be discharged home with the catheter.  Risks include, but not limited to, infection, bleeding, blood clot (thrombus formation), and puncture of an artery; nerve damage and irregular heat beat.  Alternatives to this procedure were also discussed.  PICC/Midline Placement Documentation        Jordan Ward 07/26/2014, 10:14 AM

## 2014-07-26 NOTE — Progress Notes (Signed)
Speech Language Pathology Treatment: Dysphagia  Patient Details Name: Jordan Ward MRN: [id number] DOB: [birth year] Today's Date: 07/26/2014 Time: 0320-0340 SLP Time Calculation (min): 20 min  Assessment / Plan / Recommendation Clinical Impression  Pt needed moderate visual/verbal cueing during skilled observation of po intake of honey-thickened liquids as pt stated "this liquid is too thick, you can't even drink it" during session. No s/s of aspiration noted with honey-thickened liquids without straw; pt had straw in cup initially with reflexive cough noted after intake; this was eliminated with straw removal. Pt education provided re: necessity of honey-thickened liquids d/t risk for aspiration with thin or nectar-thickened liquids and progression of MS with re: dysphagia; completed pharyngeal strengthening exercises x10 including [place] and effortful swallow during intake of honey-thickened liquids; pt refused Dysphagia 3 consistency d/t discharge from hospital pending and nursing preparing him to leave for discharge to [facility] in [place].  Swallow precautions were included in discharge packet after nursing informed of f/u with ST at [facility] needed.   HPI HPI: 73 y.o. male with hx of long-standing MS brought to [facility] ED on 10/19 with slurred speech. Had a fall the day prior but refused transport to ED.  Dx of severe sepsis. Pt has a hx of variable neurogenic dysphagia.  Last MBS studies were at [facility] in Jan and Feb of 2013, the latter of which recommended nectar-liquids/modified diet secondary to aspiration.  Per wife, dysphagia resolved and pt has been eating a regular diet with thin liquids. MBS 10/22 revealed decompensated swallow with  generalized weakness and penetration /aspiration of thin and nectar liquids.  Dys 3 with honey-thick liquids recommended.     Pertinent Vitals Pain Assessment: No/denies pain  SLP Plan  Continue with current plan of care    Recommendations Diet  recommendations: Dysphagia 3 (mechanical soft);Honey-thick liquid Liquids provided via: Cup;No straw Medication Administration: Crushed with puree Supervision: Patient able to self feed;Full supervision/cueing for compensatory strategies Compensations: Slow rate;Small sips/bites;Follow solids with liquid Postural Changes and/or Swallow Maneuvers: Seated upright 90 degrees              Oral Care Recommendations: Oral care BID Plan: Continue with current plan of care         ADAMS,PAT , M.S., CCC-SLP  07/26/2014, 4:02 PM

## 2014-07-26 NOTE — Plan of Care (Signed)
REPORT CALLED TO TINA AT [facility]

## 2014-07-26 NOTE — Telephone Encounter (Signed)
[facility] 

## 2014-07-26 NOTE — Progress Notes (Signed)
DC at this time by ambulance to [facility]

## 2014-07-26 NOTE — Clinical Social Work Placement (Signed)
Clinical Social Work Department CLINICAL SOCIAL WORK PLACEMENT NOTE 07/26/2014  Patient:  Jordan Ward, Jordan Ward  Account Number:  [id number] Admit date:  07/17/2014  Clinical Social Worker:  Lovey Newcomer  Date/time:  07/21/2014 03:48 PM  Clinical Social Work is seeking post-discharge placement for this patient at the following level of care:   SKILLED NURSING   (*CSW will update this form in Epic as items are completed)   07/21/2014  Patient/family provided with [facility] Department of Clinical Social Work's list of facilities offering this level of care within the geographic area requested by the patient (or if unable, by the patient's family).  07/21/2014  Patient/family informed of their freedom to choose among providers that offer the needed level of care, that participate in Medicare, Medicaid or managed care program needed by the patient, have an available bed and are willing to accept the patient.  07/21/2014  Patient/family informed of [facility]' ownership interest in [facility], as well as of the fact that they are under no obligation to receive care at this facility.  PASARR submitted to EDS on 07/21/2014 PASARR number received on 07/21/2014  FL2 transmitted to all facilities in geographic area requested by pt/family on  07/21/2014 FL2 transmitted to all facilities within larger geographic area on   Patient informed that his/her managed care company has contracts with or will negotiate with  certain facilities, including the following:     Patient/family informed of bed offers received:  07/24/2014 Patient chooses bed at [facility] Physician recommends and patient chooses bed at    Patient to be transferred to [facility] on  07/26/2014 Patient to be transferred to facility by Ambulance Patient and family notified of transfer on 07/26/2014 Name of family member notified:  Pricilla Holm  The following physician request were entered in  Epic:   Additional Comments:   Per MD patient ready for DC to [facility]. RN, patient, patient's family, and facility notified of DC. RN given number for report. DC packet on chart. [facility] transport requested for patient (Service request number: [phone]). CSW signing off.    Liz Beach MSW, [place], [place], JI:[id number]

## 2014-07-26 NOTE — Discharge Summary (Signed)
Physician Discharge Summary  PAULOS WINBORNE [id number] DOB: 24-Jul-1941 DOA: 07/17/2014  PCP: Collene Mares, PA-C  Admit date: 07/17/2014 Discharge date: 07/26/2014  Time spent: 40 minutes  Recommendations for Outpatient Follow-up:  1. Follow up with nursing home M.D. 2. Continue Primaxin for 2 weeks, finishing date is 08/03/2014. 3. Check BMP on Friday 11/30, adjust antibiotics according to renal function.  Discharge Diagnoses:  Active Problems:   Sepsis   Severe sepsis   Dilated cardiomyopathy   Discharge Condition: Stable  Diet recommendation: Heart healthy diet. Diet consistency: Dysphagia 3 with honey thick liquids, no stroke, medications crushed with pure.  Filed Weights   07/18/14 0500 07/19/14 0500 07/20/14 0500  Weight: 62 kg (136 lb 11 oz) 63.5 kg (139 lb 15.9 oz) 61.6 kg (135 lb 12.9 oz)    History of present illness:  Jordan Ward is a 73 y.o. M with multiple medical co-morbidities as outlined below. He presented to the [facility] ED on 10/19 with slurred speech and abdominal pain. He apparently had a mechanical fall the day prior but refused transport to ED at the time.  On day of ED presentation, he had been complaining of abdominal pain in the area of his supra-pubic catheter. His wife informed the EDP that roughly 3 weeks ago, pt was diagnosed with UTI but has been unable to take the medications due to the pills being too big (Cipro). In addition, he has had small amount of dark colored urine for the past few days. He has also had some chills, fever up to 101, and generalized weakness. He usually helps wash dishes and takes a walk in the morning, but this morning was unable to do so due to the weakness.  In ED, pt became hypotensive (78/54). He was also noted to have lactic acid of 7.4. BP and lactic acid improved after 3L IVF and neo-synephrine gtt; however, hospitalist at [facility] requested that pt be transferred to [facility]. Additional significant labs include troponin of 1.28.  At  this time, he denies any chest pain, SOB, N/V/D, abdominal pain. He does have some suprapubic discomfort at site of catheter.  Hospital Course:   Septic Shock: Resolved. Secondary to Pseudomonal UTI/bacteremia.  Sepsis physiology has been resolved, patient vitals are stable.  Pseudomonal UTI/Bacteremia: Source likely UTI. Pseudomonas susceptible only to imipenem. Suprapubic catheter associated UTI, complicated UTI. Patient discharged on imipenem for total of 2 weeks, finishing date 08/03/2014.  NSTEMI/Demand Ischemia: Had troponin elevated up to 4.93, cardiology consulted. Patient never had any chest pain or shortness of breath secondary to that. Lexiscan Myoview stress done on 07/25/14 and showed LVEF of 33%, inferior, inferior lateral and distal anterior septal scar. Per cardiology no evidence of inducible ischemia, there is global hypokinesis, no further intervention. Continue aspirin, beta blockers, nitrates and hydralazine, atorvastatin added.  Ischemic cardiomyopathy, EF 30-35%: Compensated. Continue Coreg, Imdur, and hydralazine. No ACEI/ARB due to renal failure.  Needs follow-up as outpatient to consider ACEI/ARB when renal function improves or plateaus. No loop diuretics prescribed on discharge because of patient is euvolemic.  ARF: likely secondary ATN, obstructive uropathy. Suprapubic catheter in place. Creatinine slowly improving. No recent baseline for creatinine, last creatinine prior to this admission was on 04/20/2012 was 0.8. Admitted with creatinine of 3.9 this admission, currently improved to 1.6. Follow BMP closely at the nursing home.   Hypernatremia: secondary to poor oral intake, treated with D5W infusion. Given low EF, monitor carefully.  Encourage oral intake, despite if elevated BNP he was dehydrated.  Acute Encephalopathy/Slurred speech:resolved.Neuro initially consulted, may be due to acute sepsis & associated renal failure. No clear evidence of acute  exacerbation of MS at this point.   Diarrhea: C Diff PCR negative. Suspect may be related to antibiotics.Diarrhea now resolved, passing formed stools.Rectal tube removed.   Thrombocytopenia:secondary to sepsis. HIT panel negative. Improving.Monitor CBC periodically   [id number] with Imdur,Coreg and Hydralazine   Dysphagia:secondary to MS, on Dys 3 diet. SLP following   Hx of [id number], no evidence of exacerbation. Resume Valium and prn Baclofen   Deconditioning:secondary to critical illness, underlying MS. SNF on discharge. PT while inpatient   Hx of Pul Fibrosis: Did need oxygen for brief period of time, oxygen weaned off prior to discharge.  Hypothyriodism:c/w Levothyroxine   Hx of chronic left sided hydronephroses/renal calculi:Urology following, no additional intervention is needed per Urology.   Procedures:  07/18/2014 placement of central venous catheter by Dr. Alva Garnet  Consultations:    Discharge Exam: Filed Vitals:   07/26/14 1040  BP: 99/60  Pulse:   Temp:   Resp:    General: Alert and awake, oriented x3, not in any acute distress. HEENT: anicteric sclera, pupils reactive to light and accommodation, EOMI CVS: S1-S2 clear, no murmur rubs or gallops Chest: clear to auscultation bilaterally, no wheezing, rales or rhonchi Abdomen: soft nontender, nondistended, normal bowel sounds, no organomegaly Extremities: no cyanosis, clubbing or edema noted bilaterally Neuro: Cranial nerves II-XII intact, no focal neurological deficits  Discharge Instructions You were cared for by a hospitalist during your hospital stay. If you have any questions about your discharge medications or the care you received while you were in the hospital after you are discharged, you can call the unit and asked to speak with the hospitalist on call if the hospitalist that took care of you is not available. Once you are discharged, your primary care physician will handle any further medical  issues. Please note that NO REFILLS for any discharge medications will be authorized once you are discharged, as it is imperative that you return to your primary care physician (or establish a relationship with a primary care physician if you do not have one) for your aftercare needs so that they can reassess your need for medications and monitor your lab values.  Discharge Instructions   Diet - low sodium heart healthy    Complete by:  As directed      Increase activity slowly    Complete by:  As directed           Current Discharge Medication List    START taking these medications   Details  aspirin 81 MG chewable tablet Chew 1 tablet (81 mg total) by mouth daily.    atorvastatin (LIPITOR) 40 MG tablet Take 1 tablet (40 mg total) by mouth daily at 6 PM.    carvedilol (COREG) 6.25 MG tablet Take 1 tablet (6.25 mg total) by mouth 2 (two) times daily with a meal.    hydrALAZINE (APRESOLINE) 10 MG tablet Take 1 tablet (10 mg total) by mouth every 8 (eight) hours.    imipenem-cilastatin 250 mg in sodium chloride 0.9 % 100 mL Inject 250 mg into the vein every 8 (eight) hours.    isosorbide mononitrate (IMDUR) 30 MG 24 hr tablet Take 1 tablet (30 mg total) by mouth daily.      CONTINUE these medications which have CHANGED   Details  diazepam (VALIUM) 5 MG tablet Take 1 tablet (5 mg total) by mouth 2 (two) times daily.  Qty: 10 tablet, Refills: 0    HYDROcodone-acetaminophen (NORCO) 7.5-325 MG per tablet Take 1 tablet by mouth 2 (two) times daily. Qty: 10 tablet, Refills: 0      CONTINUE these medications which have NOT CHANGED   Details  baclofen (LIORESAL) 10 MG tablet Take 10 mg by mouth 3 (three) times daily as needed for muscle spasms.    levothyroxine (SYNTHROID, LEVOTHROID) 50 MCG tablet Take 50 mcg by mouth daily.    potassium chloride SA (K-DUR,KLOR-CON) 20 MEQ tablet Take 20 mEq by mouth daily.      STOP taking these medications     verapamil (CALAN) 40 MG tablet         Allergies  Allergen Reactions  . Tetracyclines & Related Anaphylaxis and Rash  . Ciprofloxacin     Trouble swallowing   Follow-up Information   Follow up with [facility], PA-C On 07/26/2014.   Specialties:  Physician Assistant, Internal Medicine   Contact information:   [address] [place] [id number] [phone]        The results of significant diagnostics from this hospitalization (including imaging, microbiology, ancillary and laboratory) are listed below for reference.    Significant Diagnostic Studies: Ct Abdomen Pelvis Wo Contrast  07/18/2014   CLINICAL DATA:  Increased weakness. Acute sepsis. Increasing lack today.  EXAM: CT ABDOMEN AND PELVIS WITHOUT CONTRAST  TECHNIQUE: Multidetector CT imaging of the abdomen and pelvis was performed following the standard protocol without IV contrast.  COMPARISON:  07/21/2011  FINDINGS: Small bilateral pleural effusions with atelectasis or infiltration in both lung bases. Coronary artery and aortic valve calcifications.  The left kidney is significantly enlarged in comparison to the right. There is left hydronephrosis and hydroureter. Stranding around the left kidney in ureter. No ureteral stones are demonstrated but 2 stones are shown in the bladder. There are multiple intrarenal stones in the left renal pelvis and lower pole right kidney. Additional calcifications in both kidneys are probably vascular. Low-attenuation lesions arising from the upper pole of the left kidney consistent with cysts and similar to previous study.  Surgical absence of the gallbladder. Mild bile duct dilatation. Vague increased density at the area of the distal common bile duct may represent an intraductal stone. The unenhanced appearance of the liver, spleen, pancreas, adrenal glands, inferior vena cava, and retroperitoneal lymph nodes is unremarkable. Vascular calcifications in the abdominal aorta without aneurysm. Stomach, small bowel, and colon  are not abnormally distended. There is a broad-based anterior abdominal Pellot hernia which contains bowel but without evidence of compromise. No free air or free fluid in the abdomen.  Pelvis: Small amount of free fluid in the pelvis is nonspecific but likely reactive. Foley catheter decompresses the bladder. No evidence of diverticulitis. Appendix is probably surgically absent. Degenerative changes in the lumbar spine. No destructive bone lesions appreciated.  IMPRESSION: Moderate obstruction of the left kidney and ureter. Bladder stones are present, probably representing recently passed stones. No definite distal ureteral stones otherwise identified. Bilateral intrarenal stones. Stones in the left renal pelvis. Foley catheter in the bladder. Small amount of free fluid in the pelvis.   Electronically Signed   By: Lucienne Capers M.D.   On: 07/18/2014 04:42   Nm Myocar Multi W/spect W/Privette Motion / Ef  07/25/2014   CLINICAL DATA:  Chest pain, coronary artery disease, elevated troponin levels, peripheral vascular disease and hypertension.  EXAM: MYOCARDIAL IMAGING WITH SPECT (REST AND PHARMACOLOGIC-STRESS)  GATED LEFT VENTRICULAR [place] MOTION STUDY  LEFT VENTRICULAR  EJECTION FRACTION  TECHNIQUE: Standard myocardial SPECT imaging was performed after resting intravenous injection of 10 mCi Tc-21m sestamibi. Subsequently, intravenous infusion of Lexiscan was performed under the supervision of the Cardiology staff. At peak effect of the drug, 30 mCi Tc-45m sestamibi was injected intravenously and standard myocardial SPECT imaging was performed. Quantitative gated imaging was also performed to evaluate left ventricular Rigdon motion, and estimate left ventricular ejection fraction.  COMPARISON:  None.  FINDINGS: Perfusion: Inferior and inferolateral scar as well as distal anteroseptal scar present. No evidence of inducible ischemia.  [place]ain Motion: Global hypokinesis present with more significant hypokinesis involving the  inferior Amir and septum. The septum appears mildly dyskinetic.  Left Ventricular Ejection Fraction: 33 %  End diastolic volume 123456 ml  End systolic volume 70 ml  IMPRESSION: 1. Inferior, inferolateral and distal anteroseptal scar. No evidence of inducible myocardial ischemia.  2. Global hypokinesis and septal dyskinesis.  3. Left ventricular ejection fraction 33%  4. Intermediate-risk stress test findings*.  *2012 Appropriate Use Criteria for Coronary Revascularization Focused Update: J Am Coll Cardiol. [id number]. http://content.airportbarriers.com.aspx?articleid=1201161   Electronically Signed   By: Aletta Edouard M.D.   On: 07/25/2014 17:19   Dg Chest Port 1 View  07/18/2014   CLINICAL DATA:  Central line placement.  EXAM: PORTABLE CHEST - 1 VIEW  COMPARISON:  07/18/2014  FINDINGS: The right IJ central venous catheter tip is in the distal SVC approximately 2 cm above the cavoatrial junction. No complicating features. The heart and lungs are stable. No pneumothorax.  IMPRESSION: Right IJ central venous catheter tip is in the distal SVC. No complicating features.   Electronically Signed   By: Kalman Jewels M.D.   On: 07/18/2014 12:04   Dg Chest Port 1 View  07/18/2014   CLINICAL DATA:  Sepsis, shortness of breath, personal history of multiple sclerosis, peripheral vascular disease, TIA, pulmonary fibrosis, hypertension, coronary artery disease  EXAM: PORTABLE CHEST - 1 VIEW  COMPARISON:  Portable exam 0441 hr compared to 07/17/2014  FINDINGS: Normal heart size, mediastinal contours and pulmonary vascularity.  Atherosclerotic calcification aorta.  Mild RIGHT basilar atelectasis.  Atelectasis versus infiltrate developing in LEFT lower lobe.  Remaining lungs clear.  No definite pleural effusion or pneumothorax.  IMPRESSION: Mild RIGHT basilar atelectasis with developing infiltrate versus atelectasis in LEFT lower lobe.   Electronically Signed   By: Lavonia Dana M.D.   On: 07/18/2014 07:48   Dg  Chest Port 1 View  07/17/2014   CLINICAL DATA:  Recurrent urinary tract infections.  Fever.  EXAM: PORTABLE CHEST - 1 VIEW  COMPARISON:  November 10, 2011.  FINDINGS: The heart size and mediastinal contours are within normal limits. Both lungs are clear. No pneumothorax or pleural effusion is noted. The visualized skeletal structures are unremarkable.  IMPRESSION: No acute cardiopulmonary abnormality seen.   Electronically Signed   By: Sabino Dick M.D.   On: 07/17/2014 13:48   Dg Abd Portable 1v  07/17/2014   CLINICAL DATA:  73 year old male with acute abdominal pain. Initial encounter.  EXAM: PORTABLE ABDOMEN - 1 VIEW  COMPARISON:  CT Abdomen and Pelvis 07/21/2011.  FINDINGS: Portable AP supine view at 2237 hrs. Calcified atherosclerosis including bilateral renal hilar calcifications. Superimposed nephrolithiasis. Stable cholecystectomy clips. Mild respiratory motion. Non obstructed bowel gas pattern. No acute osseous abnormality identified.  IMPRESSION: 1.  Non obstructed bowel gas pattern. 2. Chronic nephrolithiasis and vascular calcified atherosclerosis.   Electronically Signed   By: Lars Pinks M.D.   On:  07/17/2014 22:55   Dg Swallowing Func-speech Pathology  07/20/2014   Selinda Orion Page, CCC-SLP     07/20/2014  4:15 PM Objective Swallowing Evaluation: Modified barium swallow  evaluation  Patient Details  Name: Jordan Ward MRN: [id number] Date of Birth: Apr 17, 1941  Today's Date: 07/20/2014 Time: 1030-1100 SLP Time Calculation (min): 30 min  Past Medical History:  Past Medical History  Diagnosis Date  . Arthritis   . MS (multiple sclerosis)   . Insomnia   . PVD (peripheral vascular disease)   . Carotid artery stenosis   . TIA (transient ischemic attack)   . Chronic indwelling Foley catheter 10/06/2011  . Pulmonary fibrosis 10/06/2011  . Dysphagia 10/07/2011  . Tremors of nervous system 10/08/2011  . Hypothyroidism 10/08/2011  . Pulmonary nodule 10/08/2011  . C. difficile colitis 09/2011  . Pneumonia   . Encephalopathy    . Urinary tract infection   . Tremors of nervous system   . Hypokalemia   . Junctional rhythm   . Anemia   . Hypernatremia   . Sacral ulcer   . HTN (hypertension), malignant 10/06/2011  . Sleep apnea   . Neuromuscular disorder   . Back pain, chronic   . Kidney stones   . High grade dysplasia in colonic adenoma 09/2005  . Gait disorder   . OSA (obstructive sleep apnea)   . Paroxysmal atrial tachycardia   . Dyslipidemia   . Cerebrovascular disease   . CAD (coronary artery disease)   . Peripheral vascular disease   . Bilateral carotid bruits   . Colon polyps   . HA (headache)    Past Surgical History:  Past Surgical History  Procedure Laterality Date  . Inguinal hernia repair  1971    bilateral  . Back surgery  1976/1979  . Colonoscopy  11/2004    Dr. Sharol Roussel sessile polyp splenic flexure, 30mm sessile  polyp desc colon, tubulovillous adenoma (bx not removed)  . Colonoscopy  01/2005    poor prep, polyp could not be found  . Colonoscopy  05/2005    with EMR, polypectomy Dr. Olegario Messier, bx showed high grade  dysplasia, partially resected  . Colonoscopy  09/2005    Dr. Arsenio Loader, [place] ink tattooing, four villous colon polyp (3  had been missed on previous colonoscopies due to limitations of  procedures  . Colon surgery  09/2005    Fleishman: four tubular adenomas, large adenomatous polyp with  HIGH GRADE dysplasia  . Appendectomy  09/2005    at time of left hemicolectomy  . Colonoscopy  09/2006    normal TI, no polyps  . Colonoscopy  10/2007    Dr. Imogene Burn distal mammillations, benign bx, normal TI,  random bx neg for microscopic colitis  . Cholecystectomy      Dr. Tamala Julian  . Suprapubic catheter insertion     HPI:   73 y.o. male with hx of long-standing MS brought to [facility] ED on  10/19 with slurred speech. Dx of severe sepsis. Pt has a hx of  variable neurogenic dysphagia. Last MBS studies were at [facility] in  Jan and Feb of 2013, the latter of which recommended  nectar-liquids/modified diet secondary to aspiration. Per  report,  dysphagia resolved and pt has been eating a regular diet with  thin liquids.  Pt initiated on Dys 3 solids and honey thick  liquids on 10/21 due to overt s/s of aspiration noted at bedside.   MBS ordered 10/22 to objectively determine safest diet  consistencies.  Assessment / Plan / Recommendation Clinical Impression  Dysphagia Diagnosis: Mild pharyngeal phase dysphagia;Moderate  pharyngeal phase dysphagia;Mild oral phase dysphagia Pt presents with a moderate multifactorial dysphagia with both  sensory and motor impairments.  Pt presents with generalized oral  motor weakness which resulted in oral residue post swallow and  prolonged transit of materials into the oropharynx.  Pt exhibited  a delayed swallow response due to decreased pharyngeal sensation  and overall poor control of boluses which resulted in silent  aspiration of large cup sips of nectar thick liquids.  Attempts  to better control boluses with small cup sips or teaspoons of  nectar thick liquids slightly improved airway protection but  still resulted in silent penetration.  Additional pharyngeal  phase impairments were characterized by decreased base of tongue  retraction, poor tongue to palate contact, and decreased  laryngeal elevation which resulted in inconsistent amounts of  pharyngeal residue across all consistencies.  SLP also suspects  some component of esophageal dysphagia impacting pharyngeal  residue due to pooling of materials at the UES and intermittent  backflow of materials into the cervical esophagus.  The  abovementioned functional impairments appeared to resolve with  increased time between bites/sips Therefore, recommend pt  continue on Dys 3 diet with honey thick liquids, meds crushed in  puree, full supervision for use of the following swallowing  precautions: slow rate, small bites/sips, and alternate solids  and liquids.  Prognosis for advancement good with trials of  advanced consistencies and pharyngeal  strengthening exercises.   Recommend repeat MBS to objectively determine readiness for diet  advancement.     Treatment Recommendation  Therapy as outlined in treatment plan below    Diet Recommendation Honey-thick liquid;Dysphagia 3 (Mechanical  Soft)   Liquid Administration via: Cup Medication Administration: Crushed with puree Supervision: Patient able to self feed;Full supervision/cueing  for compensatory strategies Compensations: Slow rate;Small sips/bites;Follow solids with  liquid Postural Changes and/or Swallow Maneuvers: Seated upright 90  degrees    Other  Recommendations Oral Care Recommendations: Oral care BID   Follow Up Recommendations   (TBD)    Frequency and Duration min 2x/week  1 week   Pertinent Vitals/Pain n/a     General Date of Onset: 07/17/14 Reason for Referral: Objectively evaluate swallowing function Previous Swallow Assessment: 2/13 Diet Prior to this Study: Dysphagia 3 (soft);Honey-thick liquids Respiratory Status: Room air Behavior/Cognition: Alert Oral Cavity - Dentition: Adequate natural dentition Oral Motor / Sensory Function: Impaired - see Bedside swallow  eval Baseline Vocal Quality: Low vocal intensity Volitional Cough: Strong Volitional Swallow: Unable to elicit Anatomy: Within functional limits Pharyngeal Secretions: Not observed secondary MBS    Reason for Referral Objectively evaluate swallowing function   Oral Phase Oral Preparation/Oral Phase Oral Phase: Impaired Oral - Honey Oral - Honey Cup: Reduced posterior propulsion;Delayed oral  transit;Lingual pumping Oral - Nectar Oral - Nectar Teaspoon: Lingual pumping;Reduced posterior  propulsion;Delayed oral transit Oral - Nectar Cup: Lingual pumping;Reduced posterior  propulsion;Delayed oral transit Oral - Solids Oral - Puree: Delayed oral transit;Reduced posterior  propulsion;Lingual pumping Oral - Mechanical Soft: Weak lingual manipulation;Impaired  mastication;Reduced posterior propulsion;Delayed oral  transit;Lingual  pumping;Lingual/palatal residue   Pharyngeal Phase Pharyngeal - Honey Pharyngeal - Honey Cup: Delayed swallow initiation;Premature  spillage to valleculae;Pharyngeal residue - valleculae;Reduced  tongue base retraction;Reduced laryngeal elevation;Pharyngeal  residue - pyriform sinuses;Pharyngeal residue - cp segment Pharyngeal - Nectar Pharyngeal - Nectar Teaspoon: Delayed swallow  initiation;Penetration/Aspiration during swallow;Pharyngeal  residue - pyriform sinuses;Reduced tongue base  retraction;Pharyngeal  residue - valleculae;Reduced laryngeal  elevation;Pharyngeal residue - cp segment Penetration/Aspiration details (nectar teaspoon): Material enters  airway, remains ABOVE vocal cords and not ejected out Pharyngeal - Nectar Cup: Delayed swallow initiation;Reduced  laryngeal elevation;Reduced tongue base  retraction;Penetration/Aspiration during swallow;Pharyngeal  residue - pyriform sinuses;Pharyngeal residue - cp  segment;Pharyngeal residue - posterior pharnyx Penetration/Aspiration details (nectar cup): Material enters  airway, passes BELOW cords without attempt by patient to eject  out (silent aspiration);Material enters airway, remains ABOVE  vocal cords and not ejected out Pharyngeal - Solids Pharyngeal - Puree: Delayed swallow initiation;Premature spillage  to valleculae;Pharyngeal residue - valleculae;Pharyngeal residue  - cp segment;Pharyngeal residue - pyriform sinuses;Reduced tongue  base retraction;Reduced laryngeal elevation Pharyngeal - Mechanical Soft: Delayed swallow  initiation;Premature spillage to valleculae;Reduced laryngeal  elevation;Reduced tongue base retraction;Pharyngeal residue -  valleculae;Pharyngeal residue - posterior pharnyx;Pharyngeal  residue - pyriform sinuses;Pharyngeal residue - cp segment  Cervical Esophageal Phase    GO    Cervical Esophageal Phase Cervical Esophageal Phase: Impaired Cervical Esophageal Phase - Comment Cervical Esophageal Comment: intermittently decreased  movement of  materials through the UES which is likely related to decreased  hyolaryngeal excursion and increased esophageal pressure with  intermittent backflow into the cervical  esophagus         Page, Selinda Orion 07/20/2014, 4:14 PM     Microbiology: Recent Results (from the past 240 hour(s))  CULTURE, BLOOD (ROUTINE X 2)     Status: None   Collection Time    07/17/14 11:29 AM      Result Value Ref Range Status   Specimen Description BLOOD RIGHT HAND   Final   Special Requests BOTTLES DRAWN AEROBIC AND ANAEROBIC 12CC   Final   Culture  Setup Time     Final   Value: 07/18/2014 14:01     Performed at [facility]   Culture     Final   Value: PSEUDOMONAS AERUGINOSA     Note: Gram Stain Report Called to,Read Back By and Verified With: [facility] A [id number] 07/18/14 AT [place] Performed at [facility]     Performed at [facility]   Report Status 07/21/2014 FINAL   Final   Organism ID, Bacteria PSEUDOMONAS AERUGINOSA   Final  CULTURE, BLOOD (ROUTINE X 2)     Status: None   Collection Time    07/17/14 11:35 AM      Result Value Ref Range Status   Specimen Description BLOOD LEFT HAND   Final   Special Requests BOTTLES DRAWN AEROBIC AND ANAEROBIC 8CC   Final   Culture  Setup Time     Final   Value: 07/18/2014 14:01     Performed at [facility]   Culture     Final   Value: PSEUDOMONAS AERUGINOSA     Note: SUSCEPTIBILITIES PERFORMED ON PREVIOUS CULTURE WITHIN THE LAST 5 DAYS.     Note: Gram Stain Report Called to,Read Back By and Verified With: [facility] A [id number] 07/18/14 AT [place] BY Mikel Cella K Performed at [facility]     Performed at [facility]   Report Status 07/20/2014 FINAL   Final  URINE CULTURE     Status: None   Collection Time    07/17/14 12:13 PM      Result Value Ref Range Status   Specimen Description URINE, CATHETERIZED   Final   Special Requests NONE   Final   Culture  Setup Time  Final   Value: 07/17/2014  12:56     Performed at [place]     Final   Value: >=100,000 COLONIES/ML     Performed at [facility]   Culture     Final   Value: Multiple bacterial morphotypes present, none predominant. Suggest appropriate recollection if clinically indicated.     Performed at [facility]   Report Status 07/20/2014 FINAL   Final  MRSA PCR SCREENING     Status: Abnormal   Collection Time    07/17/14  8:03 PM      Result Value Ref Range Status   MRSA by PCR POSITIVE (*) NEGATIVE Final   Comment:            The GeneXpert MRSA Assay (FDA     approved for NASAL specimens     only), is one component of a     comprehensive MRSA colonization     surveillance program. It is not     intended to diagnose MRSA     infection nor to guide or     monitor treatment for     MRSA infections.     RESULT CALLED TO, READ BACK BY AND VERIFIED WITH:     JAMES,L RN [id number] 07/17/14 MITCHELL,L  CLOSTRIDIUM DIFFICILE BY PCR     Status: None   Collection Time    07/17/14  9:23 PM      Result Value Ref Range Status   C difficile by pcr NEGATIVE  NEGATIVE Final  URINE CULTURE     Status: None   Collection Time    07/21/14 10:59 AM      Result Value Ref Range Status   Specimen Description URINE, SUPRAPUBIC   Final   Special Requests NONE   Final   Culture  Setup Time     Final   Value: 07/21/2014 19:50     Performed at [place]     Final   Value: 30,000 COLONIES/ML     Performed at [facility]   Culture     Final   Value: PSEUDOMONAS AERUGINOSA     Note: PTZ TO FOLLOW     Performed at [facility]   Report Status 07/26/2014 FINAL   Final   Organism ID, Bacteria PSEUDOMONAS AERUGINOSA   Final  CULTURE, BLOOD (ROUTINE X 2)     Status: None   Collection Time    07/22/14 10:40 AM      Result Value Ref Range Status   Specimen Description BLOOD RIGHT ARM   Final   Special Requests BOTTLES DRAWN AEROBIC ONLY 3CC   Final    Culture  Setup Time     Final   Value: 07/22/2014 18:04     Performed at [facility]   Culture     Final   Value:        BLOOD CULTURE RECEIVED NO GROWTH TO DATE CULTURE WILL BE HELD FOR 5 DAYS BEFORE ISSUING A FINAL NEGATIVE REPORT     Performed at [facility]   Report Status PENDING   Incomplete  CULTURE, BLOOD (ROUTINE X 2)     Status: None   Collection Time    07/22/14 10:50 AM      Result Value Ref Range Status   Specimen Description BLOOD RIGHT HAND   Final   Special Requests BOTTLES DRAWN AEROBIC ONLY [place]   Final  Culture  Setup Time     Final   Value: 07/22/2014 18:04     Performed at [facility]   Culture     Final   Value:        BLOOD CULTURE RECEIVED NO GROWTH TO DATE CULTURE WILL BE HELD FOR 5 DAYS BEFORE ISSUING A FINAL NEGATIVE REPORT     Performed at [facility]   Report Status PENDING   Incomplete     Labs: Basic Metabolic Panel:  Recent Labs Lab 07/22/14 0430 07/23/14 0609 07/24/14 0420 07/25/14 0646 07/26/14 0745  NA 155* 159* 158* 152* 149*  K 4.0 3.9 3.3* 3.7 3.5*  CL 124* 124* 123* 117* 114*  CO2 19 24 25 23 23   GLUCOSE 94 114* 106* 110* 95  BUN 50* 42* 29* 23 20  CREATININE 2.30* 2.09* 1.87* 1.74* 1.60*  CALCIUM 8.1* 8.0* 7.8* 7.9* 7.9*  MG 1.9  --   --   --   --    Liver Function Tests:  Recent Labs Lab 07/20/14 0501  AST 54*  ALT 52  ALKPHOS 89  BILITOT 0.4  PROT 5.8*  ALBUMIN 1.9*   No results found for this basename: LIPASE, AMYLASE,  in the last 168 hours No results found for this basename: AMMONIA,  in the last 168 hours CBC:  Recent Labs Lab 07/20/14 0501 07/21/14 0518 07/22/14 0430 07/23/14 0609 07/25/14 0646 07/26/14 0745  WBC 22.4* 18.4* 14.0* 17.3* 17.0* 12.8*  NEUTROABS 19.8* 13.9*  --   --   --   --   HGB 10.0* 11.5* 10.4* 10.3* 9.9* 9.4*  HCT 29.3* 34.4* 32.1* 33.3* 32.0* 30.2*  MCV 86.4 86.6 89.9 94.6 96.1 94.7  PLT 57* 59* 74* 111* 127* 145*   Cardiac Enzymes:  Recent  Labs Lab 07/20/14 0501 07/22/14 1400  TROPONINI 3.41* 0.72*   BNP: BNP (last 3 results)  Recent Labs  07/18/14 0119 07/22/14 0430  PROBNP >70000.0* 63454.0*   CBG:  Recent Labs Lab 07/19/14 2357 07/20/14 0829 07/20/14 1612 07/20/14 1840 07/20/14 2207  GLUCAP 134* 84 67* 141* 82       Signed:  Jojuan Champney A  [facility] 07/26/2014, 11:38 AM

## 2014-07-26 NOTE — Telephone Encounter (Signed)
[facility] 

## 2014-07-28 ENCOUNTER — Non-Acute Institutional Stay (SKILLED_NURSING_FACILITY): Payer: Medicare Other | Admitting: Internal Medicine

## 2014-07-28 DIAGNOSIS — N390 Urinary tract infection, site not specified: Secondary | ICD-10-CM | POA: Diagnosis not present

## 2014-07-28 DIAGNOSIS — R6520 Severe sepsis without septic shock: Secondary | ICD-10-CM

## 2014-07-28 DIAGNOSIS — I10 Essential (primary) hypertension: Secondary | ICD-10-CM

## 2014-07-28 DIAGNOSIS — A419 Sepsis, unspecified organism: Secondary | ICD-10-CM | POA: Diagnosis not present

## 2014-07-28 DIAGNOSIS — E870 Hyperosmolality and hypernatremia: Secondary | ICD-10-CM

## 2014-07-28 DIAGNOSIS — E038 Other specified hypothyroidism: Secondary | ICD-10-CM

## 2014-07-28 DIAGNOSIS — I420 Dilated cardiomyopathy: Secondary | ICD-10-CM

## 2014-07-28 LAB — CULTURE, BLOOD (ROUTINE X 2)
CULTURE: NO GROWTH
Culture: NO GROWTH

## 2014-07-28 NOTE — Progress Notes (Signed)
Patient ID: Jordan Ward, male   DOB: 1941/04/07, 73 y.o.   MRN: [id number]   This is an acute visit.  Level of care skilled.  [place]  Chief complaint-acute visit status post hospitalization for sepsis-UTI.  History of present illness.  Patient is a 73 year old male with multiple medical issues-including CHF chronic renal insufficiency and coronary artery disease as well as hypertension and an abscess.  Patient does have a suprapubic catheter and was being treated for UTI but apparently found taking the pills the ciprofloxacin too big to swallow.  On day of emergency department presentation he was thought to be septic-with hypertension-he was given aggressive IV fluids-and treated with antibiotics and apparently this resolved fairly unremarkably he was treated with imipenem for suspected Pseudomonas.  He continues on this till November 5.  He also had an elevated troponin with no chest pain or shortness of breath-per cardiology no evidence of inducible ischemia recommendation to continue aspirin and beta blocker nitrates and hydralazine a statin was added as well.  He does have a significant history of systolic CHF with a left ventricular ejection fraction of 30-35 percent he is not on an Ace or R because of renal issues.  Patient did have acute renal failure with a creatinine of 3.9 on admission this improved to 1.6 before discharge.  He also had hypernatremia secondary to poor oral intake he is on a dysphagia diet with nectar thick liquids-apparently this improved somewhat during hospitalization but most recent sodium is 151 this will have to be watched.  Currently patient has no complaints     Version 4 of 4                                Expand All Collapse All   PULMONARY / CRITICAL CARE MEDICINE   Name:Jordan Ward [id number] DOB:January 18, 1941   ADMISSION DATE: 07/17/2014 CONSULTATION DATE: 07/17/2014  REFERRING MD :  EDP  CHIEF COMPLAINT: Septic Shock  INITIAL PRESENTATION: 73 y.o. M brought to [facility] ED on 10/19 with slurred speech. Had a fall the day prior but refused transport to ED. On 10/19, had some slurring of speech and was complaining of abdominal pain in area of supra-pubic catheter. He was apparently diagnosed with UTI 3 weeks ago but has had trouble taking his meds (Cipro) because of size of pills. In ED, he was found to have UTI with severe sepsis and remained somewhat hypotensive after 3L IVF. Request was made for transfer to [facility] ICU.      STUDIES:  CXR 10/19 >>> negative.  SIGNIFICANT EVENTS: 10/19 - presented to [facility] ED, transferred to [facility] with severe sepsis.   HISTORY OF PRESENT ILLNESS: Jordan Ward is a 73 y.o. M with multiple medical co-morbidities as outlined below. He presented to the [facility] ED on 10/19 with slurred speech and abdominal pain. He apparently had a mechanical fall the day prior but refused transport to ED at the time. On day of ED presentation, he had been complaining of abdominal pain in the area of his supra-pubic catheter. His wife informed the EDP that roughly 3 weeks ago, pt was diagnosed with UTI but has been unable to take the medications due to the pills being too big (Cipro). In addition, he has had small amount of dark colored urine for the past few days. He has also had some chills, fever up to 101, and generalized weakness. He usually helps wash dishes  and takes a walk in the morning, but this morning was unable to do so due to the weakness. In ED, pt became hypotensive (78/54). He was also noted to have lactic acid of 7.4. BP and lactic acid improved after 3L IVF and neo-synephrine gtt; however, hospitalist at [facility] requested that pt be transferred to [facility]. Additional significant labs include troponin of 1.28.  At this time, he denies any chest pain, SOB, N/V/D, abdominal pain. He does have some suprapubic discomfort at site of catheter.  In the few hours post  ICU arriva: having diarrhea x 2 and stool smells like "C dif"   PAST MEDICAL HISTORY :  Past Medical History  Diagnosis Date  . Arthritis   . MS (multiple sclerosis)   . Insomnia   . PVD (peripheral vascular disease)   . Carotid artery stenosis   . TIA (transient ischemic attack)   . Chronic indwelling Foley catheter 10/06/2011  . Pulmonary fibrosis 10/06/2011  . Dysphagia 10/07/2011  . Tremors of nervous system 10/08/2011  . Hypothyroidism 10/08/2011  . Pulmonary nodule 10/08/2011  . C. difficile colitis 09/2011  . Pneumonia   . Encephalopathy   . Urinary tract infection   . Tremors of nervous system   . Hypokalemia   . Junctional rhythm   . Anemia   . Hypernatremia   . Sacral ulcer   . HTN (hypertension), malignant 10/06/2011  . Sleep apnea   . Neuromuscular disorder   . Back pain, chronic   . Kidney stones   . High grade dysplasia in colonic adenoma 09/2005  . Gait disorder   . OSA (obstructive sleep apnea)   . Paroxysmal atrial tachycardia   . Dyslipidemia   . Cerebrovascular disease   . CAD (coronary artery disease)   . Peripheral vascular disease   . Bilateral carotid bruits   . Colon polyps   . HA (headache)    Past Surgical History  Procedure Laterality Date  . Inguinal hernia repair  1971    bilateral  . Back surgery  1976/1979  . Colonoscopy  11/2004    Dr. Sharol Roussel sessile polyp splenic flexure, 53mm sessile polyp desc colon, tubulovillous adenoma (bx not removed)  . Colonoscopy  01/2005    poor prep, polyp could not be found  . Colonoscopy  05/2005    with EMR, polypectomy Dr. Olegario Messier, bx showed high grade dysplasia, partially resected  . Colonoscopy  09/2005    Dr. Arsenio Loader, [place] ink tattooing, four villous colon polyp (3 had been missed on previous colonoscopies due to limitations of procedures   . Colon surgery  09/2005    Fleishman: four tubular adenomas, large adenomatous polyp with HIGH GRADE dysplasia  . Appendectomy  09/2005    at time of left hemicolectomy  . Colonoscopy  09/2006    normal TI, no polyps  . Colonoscopy  10/2007    Dr. Imogene Burn distal mammillations, benign bx, normal TI, random bx neg for microscopic colitis  . Cholecystectomy      Dr. Tamala Julian  . Suprapubic catheter insertion                                                    03/03/12  Yes Historical Provider, MD   Allergies  Allergen Reactions  . Tetracyclines & Related Anaphylaxis and Rash  . Ciprofloxacin  Trouble swallowing    FAMILY HISTORY:  Family History  Problem Relation Age of Onset  . Colon cancer Neg Hx   . Cirrhosis Brother     etoh  . Stroke Mother 62  . Coronary artery disease Father 58  . Heart attack Brother   . Multiple sclerosis     SOCIAL HISTORY:  reports that he has quit smoking. His smoking use included Cigarettes. He smoked 0.00 packs per day. He has never used smokeless tobacco. He reports that he does not drink alcohol or use illicit drugs.         Version 3 of 4                                            Review of systems. General no complete fever or chills.Skin does not complain of rash or itching.Head ears eyes nose mouth and throatdoes not complain of dysphagia but he has been diagnosed via body verified swallow with recommendations for dysphagia 3 diet and nectar thick liquids.  Respiratory does not complain of shortness of breath or cough.  Cardiac extensive history of CHF and coronary artery disease but does not complain of chest pain.  GI does not complain of any nausea vomiting diarrhea constipation or abdominal discomfort.  GU does have a history of suprapubic catheter and UTI finishing antibiotic.  Muscle skeletal does have  weakness but does not complain of joint pain.  Neurologic does not complain of dizziness headache or syncopal-type feelings.  Psych does not complain of depression   Physical exam.  Temperature is 98.4 pulse 65 respirations 18 blood pressure 120/56.  General this is a pleasant frail elderly male in no distress sitting comfortably in his wheelchair.  His skin is warm and dry she does have a small wound cover with dry dressing the right ankle this is followed by wound care.  Eyes pupils appear reactive to light sclerae and conjunctivae are clear he has prescription lenses visual acuity appears intact.  Oropharynx clear mucous membranes moist.  Chest is clear to auscultation no labored breathing.  Heart is regular rate and rhythm he does not have significant lower extremity.  Abdomen is protuberant soft nontender with positive bowel sounds.  GU he has a suprapubic catheter draining amber colored urine.  Muscle skeletal does ambulate in a wheelchair moves all extremities 4 has some possible slight left leg weakness compared to the right grip strength appears to be intact bilaterally and strong-speech is clear cranial nerves grossly intact.  Psych he is alert and oriented pleasant and appropriate.  Labs.  07/28/2014.  WBC 11.4 hemoglobin 10.2 platelets 2:30.  Sodium 1 51/cm 3.7 BUN 23 creatinine 1.7.  Albumin 2.6 otherwise liver function tests within normal limits . Assessment and plan  .#1history of sepsis thought UTI with Pseudomonas continuing a course of imipenem--at this point appears quite stable on will need continued therapy. #2 hypernatremiaapparently this is an issue with his dietI have encouraged him strongly to try to drink and eat as much as possible he expressed understanding-this will have to be followed closely fluids will have to be encouraged to check a basic metabolic panel on Monday, November 2 with follow-up by Dr. Dellia Nims.  #3 history of CHF with  significant reduced ejection fraction-at this point appears stable currently not on diuretic his weights will have to be monitored closely he does continue on  a beta blocker Imdur and hydralazine-at this point not a candidate for a secondary to renal functions but consider at some point in the future.  #4-history of acute renal failure this appears to be improved creatinine of 1.7 appears to be likely his baseline this will have to be monitored again update metabolic panel on Monday.  History of hypertension this at this point appears controlled on the Imdur Coreg and hydralazine continue to monitor.  History of hypothyroidism he continues on Synthroid.  #7 history of pulmonary fibrosis-apparently this has been relatively stable.  #8 history of MS-apparently this has been stable for some time he is on Valium  #9-history of chronic left-sided hydronephrosis renal calculi-this is followed by urology no additional intervention per recent evaluationbyI urology.  [id number] note greater than 45 minutes spent assessing patient-reviewing his medical records-and  coordinating and formulating a plan of care for numerous diagnoses-of note greater than 50% of time spent coordinating plan of care

## 2014-07-31 ENCOUNTER — Non-Acute Institutional Stay (SKILLED_NURSING_FACILITY): Payer: Medicare Other | Admitting: Internal Medicine

## 2014-07-31 DIAGNOSIS — A419 Sepsis, unspecified organism: Secondary | ICD-10-CM

## 2014-07-31 DIAGNOSIS — E870 Hyperosmolality and hypernatremia: Secondary | ICD-10-CM | POA: Diagnosis not present

## 2014-07-31 DIAGNOSIS — R6520 Severe sepsis without septic shock: Secondary | ICD-10-CM

## 2014-07-31 DIAGNOSIS — I214 Non-ST elevation (NSTEMI) myocardial infarction: Secondary | ICD-10-CM | POA: Diagnosis not present

## 2014-08-02 ENCOUNTER — Non-Acute Institutional Stay (SKILLED_NURSING_FACILITY): Payer: Medicare Other | Admitting: Internal Medicine

## 2014-08-02 DIAGNOSIS — E870 Hyperosmolality and hypernatremia: Secondary | ICD-10-CM

## 2014-08-02 NOTE — Progress Notes (Addendum)
Patient ID: Jordan Ward, male   DOB: 09/07/1941, 73 y.o.   MRN: [id number]               HISTORY & PHYSICAL  DATE:  07/31/2014    FACILITY: [facility]    LEVEL OF CARE:   SNF   CHIEF COMPLAINT:  Admission to SNF, post stay at [facility], 07/17/2014 through 07/26/2014.    HISTORY OF PRESENT ILLNESS:  This is a patient who came to hospital with abdominal pain in the area of a chronic suprapubic catheter.  He had been previously diagnosed with a UTI, but had been unable to take medications due to the pills being "too big".    In the ED, he became hypotensive.  His lactic acid was 7.4.  He required 3 L of IV fluid and Neo-Synephrine.    He was felt to have septic shock.   He had Pseudomonas in his urine and apparently his blood.    He had acute renal failure as well as hypernatremia in the hospital.  His creatinine appears to have gone as high as 3.96.    It is also noteworthy that he has persistent hypernatremia, actually getting as high as 155 on 07/22/2014, and 159 on 07/23/2014.  He was discharged with a sodium of 159.  This was felt to be secondary to poor oral intake and treated with D5.    Also of relevance is troponin went as high as 4.93.  Noted to have an ejection fraction on Myoview of 33% with inferolateral and distal anterior septal scar.   Per Cardiology, there was no evidence of inducible ischemia.  There is global hypokinesis.  He was felt to need to continue aspirin, beta blockers, nitrates, hydralazine, and atorvastatin.    Finally, he had diarrhea that was C.difficile negative.  This resolved.    He had thrombocytopenia secondary to sepsis.  A HIT panel was negative.    PAST MEDICAL HISTORY/PROBLEM LIST:    Pseudomonas UTI/bacteremia.  He comes to [place] on imipenem, to which the Pseudomonas was only susceptible.   This will continue until 08/03/2014.    Type 2 non-ST elevation MI.  Secondary to demand ischemia.  His Myoview is listed above, ejection fraction  of 33%.    Acute renal insufficiency.  This has corrected to a creatinine of 1.6.    Hypernatremia.  To as high as 159 in the hospital.  I believe this has been repeated in the facility, although I do not see this.    Diarrhea.  Felt to be antibiotic-associated.    Thrombocytopenia.  Felt secondary to sepsis.    Hypertension.    Dysphagia.    History of chronic multiple sclerosis.  Without evidence currently of an exacerbation.    History of pulmonary fibrosis.    Hypothyroidism.  On replacement.     History of chronic left-sided hydronephrosis, renal calculi.  He is followed by Urology monthly to change his suprapubic catheter.    CURRENT MEDICATIONS:  Discharge medications include:     ASA 81 q.d.    Lipitor 40 q.d.    Coreg 6.25 b.i.d.    Hydralazine 10 mg q.8.    Imipenem 250 q.8.    Imdur 30 mg q.d.    Valium 5 mg b.i.d.    Norco 7.5/325 daily.    Baclofen 10 mg three times a day.    Synthroid 50 q.d.    KCl 20 q.d.    SOCIAL HISTORY:  HOUSING:  The patient tells me he lives with his wife in [place].   FUNCTIONAL STATUS:  He uses a walker at home.  Needs some assistance for ADLs.  Very limited outside activity.   TOBACCO USE:  He is an ex-smoker, quitting years ago.    REVIEW OF SYSTEMS:   CHEST/RESPIRATORY:  He tells me he is not short of breath and is not on oxygen at home.   CARDIAC:   No clear exertional chest pain history, although he does not have a very active lifestyle.   GI:  No nausea,  vomiting or abdominal pain currently.   GU:  Chronic suprapubic catheter, changed monthly.  Probably as a complication of his MS, I think.  He states he does not have prostate problems.    PHYSICAL EXAMINATION:   GENERAL APPEARANCE:  Cognitively intact man who is alert and able to give his own history.   CHEST/RESPIRATORY:  Clear air entry bilaterally.   CARDIOVASCULAR:  CARDIAC:   Heart sounds are normal.  He appears to be euvolemic.   GASTROINTESTINAL:   ABDOMEN:   Midline abdominal scar due to surgery for benign disease, according to the patient.   LIVER/SPLEEN/KIDNEYS:  No liver, no spleen.  No tenderness.   GENITOURINARY:  BLADDER:   Suprapubic catheter is in place.   NEUROLOGICAL:    CRANIAL NERVES:  Cranial nerves seem intact.   SENSATION/STRENGTH:  He has a mild left pronator drift.  Very significant proximal lower extremity weakness.   BALANCE/GAIT:  He is able to bring himself to a standing position.  Bilateral Trendelenburg gait.   PSYCHIATRIC:   MENTAL STATUS:   I think this man is cognitively intact.  Slightly slurred speech, but easily comprehensible.     ASSESSMENT/PLAN:  Pseudomonas bacteremia and pyelonephritis.  This is a scenario where surveillance cultures after the antibiotic may be warranted.    Longstanding MS with some disability here.    Non-ST elevation MI in the setting of demand ischemia.  He has dilated cardiomyopathy.  Seen by Cardiology.  He had a Myoview that did not show reversible ischemia.    Acute renal failure.  Prerenal factors mostly.    Hypernatremia.  I find this a little more difficult to understand.  The patient states he drinks well, although he does have swallowing issues.  His basic metabolic panel will need to be rechecked.    I think the major issue for this patient will be to get up into his previous level of independence.  He will finish his Primaxin on 08/03/2014.  I think he would benefit from OT and PT here.

## 2014-08-04 ENCOUNTER — Non-Acute Institutional Stay (SKILLED_NURSING_FACILITY): Payer: Medicare Other | Admitting: Internal Medicine

## 2014-08-04 DIAGNOSIS — F05 Delirium due to known physiological condition: Secondary | ICD-10-CM

## 2014-08-04 DIAGNOSIS — E870 Hyperosmolality and hypernatremia: Secondary | ICD-10-CM | POA: Diagnosis not present

## 2014-08-04 DIAGNOSIS — N390 Urinary tract infection, site not specified: Secondary | ICD-10-CM

## 2014-08-04 DIAGNOSIS — R410 Disorientation, unspecified: Secondary | ICD-10-CM

## 2014-08-04 NOTE — Progress Notes (Signed)
Patient ID: Jordan Ward, male   DOB: March 28, 1941, 73 y.o.   MRN: [id number]  .this is an acute visit.  Level care skilled.  Facility [facility]   HISTORY OF PRESENT ILLNESS: This is a patient who came to hospital with abdominal pain in the area of a chronic suprapubic catheter. He had been previously diagnosed with a UTI, but had been unable to take medications due to the pills being "too big".   In the ED, he became hypotensive.    He was felt to have septic shock. He had Pseudomonas in his urine and apparently his blood.   He had acute renal failure as well as hypernatremia in the hospital. His creatinine appears to have gone as high as 3.96.   It is also noteworthy that he has persistent hypernatremia, actually getting as high as 155 on 07/22/2014, and 159 on 07/23/2014. He was discharged with a sodium of 159. This was felt to be secondary to poor oral intake and treated with D5.--we have been following this and the facility most recent sodium is 153 this is been his relative baseline recently-we have encouraged by mouth intake and apparently he is doing a bit better with this he is on thickened liquids and appears to be somewhat more compliant with this recently   Also of relevance is troponin went as high as 4.93. Noted to have an ejection fraction on Myoview of 33% with inferolateral and distal anterior septal scar. Per Cardiology, there was no evidence of inducible ischemia. There is global hypokinesis. He was felt to need to continue aspirin, beta blockers, nitrates, hydralazine, and atorvastatin.   Finally, he had diarrhea that was C.difficile negative. This resolved.    Apparently over the past several days he's had some episodes of what nursing describes as mania-some hyperactivity fidgety-his wife has noted this as well.  Apparently this morning patient could not provide certain care because he was moving around so much.  He is bit better though later  today he is pleasant grossly oriented and appropriate with me although does appear to be somewhat fidgety and he is aware of this stating that they tell me I'm not myself.  His vital signs continued to be stable.  He does not complain of any chest pain or shortness of breath fever or chills-he has just completed an antibiotic for his sepsis   PAST MEDICAL HISTORY/PROBLEM LIST:   Pseudomonas UTI/bacteremia. He came to [place] on imipenem, to which the Pseudomonas was only susceptible--he has just completed this.   Type 2 non-ST elevation MI. Secondary to demand ischemia. His Myoview is listed above, ejection fraction of 33%.   Acute renal insufficiency. This has corrected to a creatinine of 1.97.   Hypernatremia. To as high as 159 in the hospital.  this has been repeated in the facility, most recently 153   Diarrhea. Felt to be antibiotic-associated.   Thrombocytopenia. Felt secondary to sepsis.   Hypertension.   Dysphagia.   History of chronic multiple sclerosis. Without evidence currently of an exacerbation.   History of pulmonary fibrosis.   Hypothyroidism. On replacement.   History of chronic left-sided hydronephrosis, renal calculi. He is followed by Urology monthly to change his suprapubic catheter.  CURRENT MEDICATIONS: Discharge medications include:   ASA 81 q.d.   Lipitor 40 q.d.   Coreg 6.25 b.i.d.   Hydralazine 10 mg q.8.   Imipenem 250 q.8.   Imdur 30 mg q.d.   Valium 5 mg b.i.d.   Norco 7.5/325  daily.   Baclofen 10 mg three times a day.   Synthroid 50 q.d.   KCl 20 q.d.  SOCIAL HISTORY:  HOUSING: The patient tells  lives with his wife in [place].  FUNCTIONAL STATUS: He uses a walker at home. Needs some assistance for ADLs. Very limited outside activity.  TOBACCO USE: He is an ex-smoker, quitting years ago.   REVIEW OF SYSTEMS Gen. No complaints of fever chills:    CHEST/RESPIRATORY: He tells me he is not short of breat  norr has  cough.  CARDIAC: No clear exertional chest pain history, .  GI: No nausea, vomiting or abdominal pain currently.  GU: Chronic suprapubic catheter, changed monthly. Probably as a complication of his MS, I think.he does not complain of burning or suprapubic pain Neurologic does not complain of any dizziness or headache or syncopal-type feelings. Psychchanges as noted above.   PHYSICAL EXAMINATION Temperature 98.4 pulse 69 respirations 18 blood pressure 124/56 O2 saturation 93% on room air:  GENERAL APPEARANCE: pleasant elderly male appears to be somewhat fidgety-but appears to be grossly cognitively intact pleasant and cooperative-according to nursing staff he has somewhat hoarding behavior recently and did ask me if  had a pen that he could keep. Skin is warm and dry.  Eyes pupils appear reactive to light sclerae and conjunctivae are clear.  His speech is clear  CHEST/RESPIRATORY: Clear air entry bilaterally.  CARDIOVASCULAR:  CARDIAC: Heart sounds are normal. He appears to be euvolemic.  GASTROINTESTINAL:  ABDOMEN: Midline abdominal scar due to surgery for benign disease, according to the patient.  LIVER/SPLEEN/KIDNEYS: No liver, no spleen. No tenderness.  GENITOURINARY:  BLADDER: Suprapubic catheter is in place--.  NEUROLOGICAL:  CRANIAL NERVES: Cranial nerves seem intact.  SENSATION/STRENGTH:  proximal lower extremity weakness.  Neurologic really could not see any lateralizing findings  is able to move his extremities with baseline strength and range of motion-his speech is clear-cranial nerves are grossly intact.  PSYCHIATRIC:  MENTAL STATUS: I think this man is cognitively intact. Slightly slurred speech, but easily comprehensiblethis is baseline-again he appears to have somewhat hoarding behavior and does appear to be somewhat jittery-although apparently  improved from this morning He was able to give me a fairly accurate history today-he is oriented to self place appeared to be aware of what's happening.  Labs.  08/02/2014.  Sodium 153 potassium 4.1 BUN 23 creatinine 1.97.  07/31/2014.  WBC 15.0 hemoglobin 9.6 platelets 255.  Sodium 153 potassium 4.1 BUN 22 creatinine 1.81.  Assessment and plan.  #1-increased confusion? Change in mental status-will order a psychiatric consult his wife is in agreement with this I did speak with her via phone.  Also will update a CBC with differential metabolic panel and ammonia level.  His vital signs are stable appears to be more at his baseline this afternoon although again nursing staff and his wife is concerned about these episodes were he's somewhat manic.  Also will check a urine culture with his history of UTI and sepsis to rule out infectious etiology.  #2 history of hyponatremia-again will update a metabolic panel here to make sure electrolytes are stable hopefully improving.  #3 history urosepsis --again will update a urine culture  [id number]  .

## 2014-08-06 ENCOUNTER — Encounter: Payer: Self-pay | Admitting: Internal Medicine

## 2014-08-06 DIAGNOSIS — R410 Disorientation, unspecified: Secondary | ICD-10-CM | POA: Insufficient documentation

## 2014-08-06 DIAGNOSIS — F05 Delirium due to known physiological condition: Principal | ICD-10-CM

## 2014-08-06 NOTE — Progress Notes (Addendum)
Patient ID: Jordan Ward, male   DOB: [birth year], 73 y.o.   MRN: [id number]               PROGRESS NOTE  DATE:  08/02/2014    FACILITY: [facility]    LEVEL OF CARE:   SNF   Acute Visit   CHIEF COMPLAINT:  Follow up medical issues.    HISTORY OF PRESENT ILLNESS:  I admitted Jordan Ward to the facility two days ago.  He is a gentleman who has multiple sclerosis and a chronic suprapubic catheter.    He was admitted to hospital initially on 07/17/2014 with septic shock, ultimately Pseudomonas bacteremia and a UTI.  He also had a non-ST elevation demand ischemia (type 2 MI), troponin elevation to 4.93.  An echocardiogram showed an EF of 30-35%.    He also has hypernatremia, felt to be secondary to poor oral intake.  He received D5W infusions in the hospital.    LABORATORY DATA:  His lab work here on 07/31/2014 showed a sodium of 153.  His creatinine was 1.81.  Repeated yesterday, his creatinine was 1.94.  I am awaiting a follow-up today.   Potassium 4.1, CO2 29, BUN 22.   His white count is 15.    REVIEW OF SYSTEMS:   GENERAL:  The patient has very few complaints.  He does not like the thickened liquids he is on.    CHEST/RESPIRATORY:  No complaints of cough or shortness of breath.   CARDIAC:   No chest pain.    PHYSICAL EXAMINATION:   CHEST/RESPIRATORY:  Clear air entry bilaterally.   CARDIOVASCULAR:  CARDIAC:   Heart sounds are normal.  There are no murmurs.  No gallops.  He appears to be somewhat dehydrated.    GASTROINTESTINAL:  ABDOMEN:   Somewhat protuberant.  No tenderness.  No masses.    ASSESSMENT/PLAN:  Hypernatremia.  This, I agree, is probably poor oral intake.  He volunteers that he does not like the thickened liquids.  I am not sure how many of them he is taking, nor am I completely certain about why they were felt to be required at this point.  I am somewhat perplexed by the fact that he is putting out a dilute looking urine.   I have asked for a urine  osmolality as a screen for diabetes insipidus.  I have seen multiple sclerosis cause some unusual features including temperature disregulation, etc.    Probable prerenal azotemia.  I am waiting for a basic metabolic panel to come back.

## 2014-08-07 ENCOUNTER — Non-Acute Institutional Stay (SKILLED_NURSING_FACILITY): Payer: Medicare Other | Admitting: Internal Medicine

## 2014-08-07 ENCOUNTER — Ambulatory Visit (HOSPITAL_COMMUNITY)
Admit: 2014-08-07 | Discharge: 2014-08-07 | Disposition: A | Discharge status: Home / Self Care | Payer: No Typology Code available for payment source | Source: Ambulatory Visit | Attending: Internal Medicine | Admitting: Internal Medicine

## 2014-08-07 DIAGNOSIS — E870 Hyperosmolality and hypernatremia: Secondary | ICD-10-CM

## 2014-08-07 DIAGNOSIS — D649 Anemia, unspecified: Secondary | ICD-10-CM | POA: Insufficient documentation

## 2014-08-07 DIAGNOSIS — N179 Acute kidney failure, unspecified: Secondary | ICD-10-CM

## 2014-08-07 DIAGNOSIS — N2889 Other specified disorders of kidney and ureter: Secondary | ICD-10-CM | POA: Insufficient documentation

## 2014-08-07 DIAGNOSIS — N200 Calculus of kidney: Secondary | ICD-10-CM | POA: Diagnosis not present

## 2014-08-08 ENCOUNTER — Encounter: Payer: Self-pay | Admitting: Internal Medicine

## 2014-08-08 ENCOUNTER — Non-Acute Institutional Stay (SKILLED_NURSING_FACILITY): Payer: Medicare Other | Admitting: Internal Medicine

## 2014-08-08 DIAGNOSIS — N289 Disorder of kidney and ureter, unspecified: Secondary | ICD-10-CM

## 2014-08-08 DIAGNOSIS — I9589 Other hypotension: Secondary | ICD-10-CM | POA: Diagnosis not present

## 2014-08-08 DIAGNOSIS — I10 Essential (primary) hypertension: Secondary | ICD-10-CM | POA: Diagnosis not present

## 2014-08-08 DIAGNOSIS — E870 Hyperosmolality and hypernatremia: Secondary | ICD-10-CM | POA: Diagnosis not present

## 2014-08-08 NOTE — Progress Notes (Addendum)
Patient ID: Jordan Ward, male   DOB: 09/08/1941, 73 y.o.   MRN: [id number]               PROGRESS NOTE  DATE:  08/07/2014    FACILITY: [facility]    LEVEL OF CARE:   SNF   Acute Visit   CHIEF COMPLAINT:  Altered mental status.    HISTORY OF PRESENT ILLNESS:  I was called last week and the patient was seen by our service for complaints of altered mental status, increasing confusion.    The patient came here to [place] initially after being admitted to hospital with pseudomonal UTI and bacteremia.  He finished his imipenem on 08/03/2014.    He also has a history of ischemic cardiomyopathy.    In the hospital, he was noticed to have hypernatremia due to poor oral intake.  This got as high as 153 here.    He also had acute renal failure with a creatinine as high as 3.88.  At discharge, his creatinine was 1.6 on 07/20/2014.  On 08/02/2014, his creatinine was slightly higher at 1.96.    LABORATORY DATA:  Today, his lab work shows a sodium of 149 (not as high as it has been either here or in the hospital).  His BUN is 34, but his creatinine is up to 4.22.    His white count is 11.1, hemoglobin at 10.2, differential count essentially normal.    His ammonia level is 24.    Urinalysis shows moderate blood, large leukocyte esterase, no bacteria, and a specific gravity of 1.01.  PHYSICAL EXAMINATION:   VITAL SIGNS:   O2 SATURATIONS:  96% on room air.   PULSE:  92.   RESPIRATIONS:  20 and unlabored.   GENERAL APPEARANCE:  The patient does not look to be in any distress.  Sitting in a wheelchair, eating lunch.   CHEST/RESPIRATORY:  Clear air entry bilaterally.   CARDIOVASCULAR:  CARDIAC:   Heart sounds are normal.  He appears to be euvolemic.   GASTROINTESTINAL:  ABDOMEN:   Soft.   LIVER/SPLEEN/KIDNEYS:  No liver, no spleen.  No tenderness.   GENITOURINARY:   CIRCULATION:  EDEMA/VARICOSITIES:  Extremities:  No edema.   BLADDER:   Suprapubic catheter in place.  There is no  costovertebral angle tenderness and no suprapubic tenderness.     FOLEY CATHETER:  He has roughly 500 cc of dilute urine in his bag.  I am not sure when this was last changed.    ASSESSMENT/PLAN:  Acute renal failure.  The creatinine today at 4.22 is higher than at any time during the hospitalization.  His sodium is 149, although that is not as high as it was during the hospitalization or even as high as it was last week (153).  He does have a history of chronic left-sided hydronephrosis secondary to renal calculi.  Urology saw the patient and no additional intervention was felt to be necessary.  There was no renal ultrasound during this hospitalization.  This does not look to be prerenal.    Hypernatremia.  This was felt to be due to inadequate oral intake in the hospital.  Interestingly, he excretes a reasonably dilute urine.  I would wonder whether this patient requires some form of ADH challenge.  In any case, for now he is going to get hypotonic fluid in the setting of acute renal failure.  I am also going to do a renal ultrasound.  If this does not improve, he is  likely to end up in the hospital.            ADDENDUM:  With regards to his mental status, I really do not see an issue here.  He is somewhat louder than I am used to, although he was never quiet and always somewhat boisterous.  If there is delirium here, I do not really see it.  Nevertheless, I am concerned based on the presentation.

## 2014-08-08 NOTE — Progress Notes (Signed)
Patient ID: Jordan Ward, male   DOB: 12/16/1940, 73 y.o.   MRN: [id number]    This is an acute visit  Level care skilled.  Facility [facility]   HISTORY OF PRESENT ILLNESS: This is a patient who came to hospital with abdominal pain in the area of a chronic suprapubic catheter  He was diagnosed with bacteremia urosepsis-secondary to Pseudomonas and has completed IV antibiotic.  He also was noted to have acute renal failure with creatinine as high as 3.96 this did trend down into the higher ones before discharge. Para.   It is also noteworthy that he has persistent hypernatremia, actually getting as high as 155 on 07/22/2014, and 159 on 07/23/2014. He was discharged with a sodium of 159. This was felt to be secondary to poor oral intake and treated with D5.--his sodium did trend down in the facility down to 153 last week.  Patient was seen last week for what was thought to be altered mental status and lab work was ordered which came back significant for an elevated creatinine of 4.22-sodium was actually improved at 149.  Dr. Dellia Nims did see the patient yesterday and started him on aggressive IV half-normal saline at 125 mL an hour lab work has come back today showing an improved creatinine of 3.35 BUN of 28 and sodium of 146.  Mental status wise he appears to be at baseline Dr. Dellia Nims saw him yesterday did not find any acute mental status changes  continues relatively unchanged somewhat confused --largely appropriate.  Still somewhat obsessive with obtaining pens    A renal ultrasound was obtained which did not show any acute changes he does have a history of a mild left sided hydronephrosis but this is not new.  Vital signs are stable however when I took his blood pressure manually it was low at 88/60-he is on hydralazine 10 mg 3 times a day as well as Coreg 6.25 mg twice a day.-he is also on Imdur  He does not appear to be symptomatic dizzy or any palpitations.  He is  complaining somewhat of increased swelling in his feet-however his weights appear to be stable--he does have a history of ischemic cardiomyopathy-  .  Marland Kitchen  s   PAST MEDICAL HISTORY/PROBLEM LIST:   Pseudomonas UTI/bacteremia. He came to [place] on imipenem, to which the Pseudomonas was only susceptible--he has just completed this.   Type 2 non-ST elevation MI. Secondary to demand ischemia. His Myoview is listed above, ejection fraction of 33%.   Acute renal insufficiency. This has corrected to a creatinine of 1.97.   Hypernatremia. To as high as 159 in the hospital. this has been repeated in the facility, most recently 153   Diarrhea. Felt to be antibiotic-associated.   Thrombocytopenia. Felt secondary to sepsis.   Hypertension.   Dysphagia.   History of chronic multiple sclerosis. Without evidence currently of an exacerbation.   History of pulmonary fibrosis.   Hypothyroidism. On replacement.   History of chronic left-sided hydronephrosis, renal calculi. He is followed by Urology monthly to change his suprapubic catheter.  CURRENT MEDICATIONS: D medications include:   ASA 81 q.d.   Lipitor 40 q.d.   Coreg 6.25 b.i.d.   Hydralazine 10 mg q.8.   Imipenem 250 q.8--he has completed this.   Imdur 30 mg q.d.   Valium 5 mg b.i.d.   Norco 7.5/325 daily.   Baclofen 10 mg three times a day.   Synthroid 50 q.d.   KCl 20 q.d.  Currently  receiving IV half-normal saline 125 mL an hour  SOCIAL HISTORY:  HOUSING: The patient tells lives with his wife in [place].  FUNCTIONAL STATUS: He uses a walker at home. Needs some assistance for ADLs. Very limited outside activity.  TOBACCO USE: He is an ex-smoker, quitting years ago.   REVIEW OF SYSTEMS Gen. No complaints of fever chills:  CHEST/RESPIRATORY: He tells me he is not short of breat nor has cough.  CARDIAC: No clear exertional chest  pain history, --thinks his feet are swelling some.  GI: No nausea, vomiting or abdominal pain currently.  GU: Chronic suprapubic catheter, changed monthly. Probably as a complication of his MS, I think.he does not complain of burning or suprapubic pain Neurologic does not complain of any dizziness or headache or syncopal-type feelings. Psychchanges as noted above.   PHYSICAL EXAMINATION Temperature 98.6 pulse 89 respirations 20 blood pressure taken by machine was 139/100 however taken manually by me and nursing was 88/60-weight is stable at 126-O2 sats ration 95%.    GENERAL APPEARANCE: pleasant elderly male appears to be somewhat fidgety-but appears to be grossly cognitively intact pleasant and cooperative-this appears similar to what I saw last week  Skin is warm and dry.  Eyes pupils appear reactive to light sclerae and conjunctivae are clear.  His speech is clear   CHEST/RESPIRATORY: Clear air entry bilaterally--no labored breathing .  CARDIOVASCULAR:  CARDIAC: Heart sounds are normal. He does have some mild edema of his feet bilaterally   GASTROINTESTINAL:  ABDOMEN: Midline abdominal scar due to surgery for benign disease, according to the patient.  LIVER/SPLEEN/KIDNEYS: No liver, no spleen. No tenderness.  GENITOURINARY:  BLADDER: Suprapubic catheter is in place--.  NEUROLOGICAL:  CRANIAL NERVES: Cranial nerves seem intact.  SENSATION/STRENGTH: proximal lower extremity weakness.  Neurologic really could not see any lateralizing findings is able to move his extremities with baseline strength and range of motion-his speech is clear-cranial nerves are grossly intact.  PSYCHIATRIC:  MENTAL STATUS: I think this man is cognitively intact. Slightly slurred speech, but easily comprehensiblethis is baseline-again is somewhat anxious and does appear somewhat obsessed at times with obtaining pens .  Labs  08/08/2014.  Sodium  146 potassium 4.7 UN 28 creatinine 3.35.  08/07/2014.  WBC 11.1 hemoglobin 10.2 platelets 218.  Sodium 149 potassium 5.1 BUN 34 creatinine 4.22.  Alkaline phosphatase 128 otherwise liver function tests within normal limits.  08/02/2014.  Sodium 153 potassium 4.1 BUN 23 creatinine 1.97.  07/31/2014.  WBC 15.0 hemoglobin 9.6 platelets 255.  Sodium 153 potassium 4.1 BUN 22 creatinine 1.81.  Assessment and plan.  #1-acute renal insufficiency-again this is being treated aggressively with IV fluids this appears to be heading in the right direction-update BMP is pending for tomorrow at this point continue IV fluids-this was discussed with Dr. Dellia Nims via phone Renal ultrasound did not show any acute changes  #2-increased confusion? This appears relatively baseline today-a psychiatric consult is pending we also are waiting results of a urine culture   #3 history of hyponatremia-this appears to be gradually improving again he is getting IVs  #4 history urosepsis -  a urine culture pending  #5-hypotension-this does not appear to be symptomatic-he is on hydralazine will discontinue this for now and monitor he does continue on Coreg 6.25 mg twice a day--he is also on Imdur  #6-history of ischemic cardiomyopathy ejection fraction 30-35 percent---will have to be monitored so far his weights are stable--clinically appears stable in this regards-this is a challenging balancing situation with his renal  issues    [phone]

## 2014-08-09 ENCOUNTER — Non-Acute Institutional Stay (SKILLED_NURSING_FACILITY): Payer: Medicare Other | Admitting: Internal Medicine

## 2014-08-09 DIAGNOSIS — F05 Delirium due to known physiological condition: Secondary | ICD-10-CM

## 2014-08-09 DIAGNOSIS — N289 Disorder of kidney and ureter, unspecified: Secondary | ICD-10-CM

## 2014-08-09 DIAGNOSIS — E870 Hyperosmolality and hypernatremia: Secondary | ICD-10-CM | POA: Diagnosis not present

## 2014-08-09 DIAGNOSIS — R410 Disorientation, unspecified: Secondary | ICD-10-CM

## 2014-08-10 DIAGNOSIS — F0633 Mood disorder due to known physiological condition with manic features: Secondary | ICD-10-CM | POA: Diagnosis not present

## 2014-08-10 DIAGNOSIS — F419 Anxiety disorder, unspecified: Secondary | ICD-10-CM | POA: Diagnosis not present

## 2014-08-11 NOTE — Progress Notes (Addendum)
Patient ID: Jordan Ward, male   DOB: 11/15/1940, 73 y.o.   MRN: [id number]               PROGRESS NOTE  DATE:  08/09/2014    FACILITY: [facility]    LEVEL OF CARE:   SNF   Acute Visit  CHIEF COMPLAINT:  Follow up altered mental status, dehydration.    HISTORY OF PRESENT ILLNESS:  I was called last week to a report of increasing mental status agitation in this man, also inattention.  He was noted to have very significant dehydration based on lab work done on Monday with a sodium of 149, a BUN of 34, and a creatinine of 4.22.  Since then, I have been giving him half-normal saline.  He is probably up close to 5 L at this point.  Today, his sodium has come down to 142.  His BUN is 22, creatinine of 2.56.  He seems to have a baseline creatinine of slightly below 2   The patient has multiple sclerosis.  He apparently has failed seven swallowing tests, although he has not had an aspiration pneumonitis.  He is currently on pureed with thick liquids, which he tolerates much better per Speech Therapy.  However, apparently he has not been drinking very well, perhaps 90 cc with breakfast this morning.  I have also wondered whether the patient actually puts out a more dilute urine than his volume status would justify.  When his sodium was 153, his urine osmolality was in the 400 range.  This would suggest that his urine is not maximally concentrated.    PHYSICAL EXAMINATION:   CHEST/RESPIRATORY:  Exam is clear.   CARDIOVASCULAR:  CARDIAC:   Heart sounds are normal.  He still looks somewhat volume-contracted to me.   PSYCHIATRIC:   MENTAL STATUS:   He is alert, cognizant.  I think he is somewhat loud and boisterous.  The staff state that he is inattentive.    ASSESSMENT/PLAN:  Dehydration.   He is responding currently to fluid resuscitation with half-normal saline.  I will give him another bag of fluid and then stop and watch things.  I have asked the staff to monitor his oral intake of the  nectar-thick liquids as well as the urine output (which should be possible since he has a chronic catheter).  I have also encouraged the patient to drink as at least part of this problem now appears to be inadequate intake of the nectar-thickened liquids.    I have spent an extensive amount of time talking to the patient about this, also in consultation with nursing and Speech Therapy.  My final thought on this is that he may have a component of diabetes insipidus, although I have not definitively proven this.  As mentioned, his urine osmolality in the mid 400s in the face of a sodium of 153 might suggest either inadequate secretion or response to ADH.

## 2014-08-16 ENCOUNTER — Non-Acute Institutional Stay (SKILLED_NURSING_FACILITY): Payer: Medicare Other | Admitting: Internal Medicine

## 2014-08-16 ENCOUNTER — Encounter: Payer: Self-pay | Admitting: Neurology

## 2014-08-16 DIAGNOSIS — F05 Delirium due to known physiological condition: Secondary | ICD-10-CM

## 2014-08-16 DIAGNOSIS — R410 Disorientation, unspecified: Secondary | ICD-10-CM

## 2014-08-16 DIAGNOSIS — N289 Disorder of kidney and ureter, unspecified: Secondary | ICD-10-CM

## 2014-08-16 DIAGNOSIS — I420 Dilated cardiomyopathy: Secondary | ICD-10-CM

## 2014-08-18 ENCOUNTER — Non-Acute Institutional Stay (SKILLED_NURSING_FACILITY): Payer: Medicare Other | Admitting: Internal Medicine

## 2014-08-18 DIAGNOSIS — I420 Dilated cardiomyopathy: Secondary | ICD-10-CM

## 2014-08-18 DIAGNOSIS — E870 Hyperosmolality and hypernatremia: Secondary | ICD-10-CM

## 2014-08-18 DIAGNOSIS — N289 Disorder of kidney and ureter, unspecified: Secondary | ICD-10-CM

## 2014-08-18 DIAGNOSIS — R609 Edema, unspecified: Secondary | ICD-10-CM

## 2014-08-18 NOTE — Progress Notes (Addendum)
Patient ID: Jordan Ward, male   DOB: 08/03/1941, 73 y.o.   MRN: [id number]               PROGRESS NOTE  DATE:  08/16/2014    FACILITY: [facility]    LEVEL OF CARE:   SNF   Acute Visit   CHIEF COMPLAINT:  Follow up altered mental status, dehydration, hypernatremia.    HISTORY OF PRESENT ILLNESS:   Mr. Weinmann is a gentleman who actually has advanced MS.  He walks with a walker at home and is cared for by his wife.    He came to [place] after a 10-day stay at [facility] in late October.  He had Pseudomonas sepsis.  He completed his antibiotics here and, in that regard, he has done well.    Other medical issues include an ejection fraction of 33%.  His troponin was high in the hospital.  However, he had a Myoview scan that showed no inducible ischemia.  He did have global hypokinesis.   Since his arrival here, he developed dehydration and really significant acute renal failure.  I was a bit mystified by the etiology of this until I noticed that he was put on thickened liquids and really was not drinking any of this.  Nevertheless, at its worst, his creatinine got up to 4.22.  I had to give him 4 L roughly of half-normal saline.  I actually had a fairly stern talk with him about drinking his nectar-thick liquids last week and since then his intake has improved.  His sodium has maintained steady at 140.  At one point, I did a spot urine on him for osmolality which was greater than 400.  I am not sure he concentrates all that well although, as long as his sodium is maintained, I am not prepared to track this down.  He does have some chronic renal failure.  His creatinine today is 1.83.  I think this is its baseline.    Finally, the time that he was ill, he became very odd in his behavior.  I had interpreted this as delirium.  This largely has resolved.  He was seen by Psychiatry and put on Seroquel 50 mg q.h.s.  I am not in favor of this.  I would like to see him off this and see if his  behavior returns.  In discussion today with his wife, she states he has no background psychiatric history.  He probably does have some pseudobulbar type affect, although this is not that common (crying).    PHYSICAL EXAMINATION:   GENERAL APPEARANCE:   He does not appear to be dehydrated.   CHEST/RESPIRATORY:  Status appears to be quite normal.   CARDIOVASCULAR:  CARDIAC:  Status appears to be quite normal.  jvp not elevated. No S3  ASSESSMENT/PLAN:    Delirium.  Largely secondary to significant dehydration.  I would not proceed with neuroleptics. It is possible he has frontal lobe disinhibition related to the location of his MS or pseudobulbar state.   Dysphagia.  Related to his MS.  He is on thickened liquids and apparently did not do well on a swallowing study.  I suspect he will not adhere to this at home.  Nevertheless, he is drinking adequately at the moment.    Chronic renal failure.  His baseline creatinine appears to be 1.6.  This is stable.    Presumably ischemic cardiomyopathy.  This appears to be stable currently.

## 2014-08-18 NOTE — Progress Notes (Signed)
Patient ID: Jordan Ward, male   DOB: 10/04/1940, 73 y.o.   MRN: [id number]   This is an acute visit.  I'll of care skilled.  Facility [facility].  Chief complaint-acute visit secondary to increased edema.  History of present illness.  Patient is a pleasant 73 year old male with a complicated medical history including recent treatment for Pseudomonas UTI-also non-ST MI secondary to demand ischemia-also a history of acute on chronic renal failure with elevated sodium.  Despite this he appears to have done relatively well here lately-she did receive aggressive IV hydration after his creatinine most significantly in this has normalized more to his baseline with a creatinine of 1.61 and BUN of 14 on November 18.  Sodium 140.  He does have a history of dilated cardiomyopathy with an ejection fraction of 30-35 percent-he apparently had been on diuretics in the past but he was thought to be compensated as well as there were the renal issues so he was not discharged on any diuretic.  However patient has developed some increased lower extremity edema especially was feet appears she's gained about 9 pounds since his admission here 2 pounds since yesterday.  He does not complain of any shortness of breath or cough or chest pain.  Family medical social history as been reviewed per admission note on 07/31/2014.  Medications have been reviewed per MAR.  Review of systems.  General no complaints of fever chills.  Respirator does not complain of increased shortness of breath or cough.  Cardiac no chest pain he does have edema as noted above.  GI does not complain of nausea vomiting diarrhea constipation or abdominal discomfort.  GU he does have an indwelling Foley catheter-does not complain of dysuria.  Musculoskeletal does not complain of joint pain.  Neurologic does not complain of any headache dizziness or syncopal-type feelings.   Physical exam.  Temperature is 98.6 pulse 76  respirations 18 blood pressure 130/61 O2 saturation 97% on room air.  In general is a pleasant elderly male in no distress sitting comfortably in his wheelchair.  His skin is warm and dry  Oropharynx clear mucous membranes moist.  .  Chest is clear to auscultation there is no labored breathing.  Heart is regular rate and rhythm without murmur gallop or rub he does have trace lower extremity edema but 1-2 plus pedal edema bilaterally this is pitting-I did not really appreciate much sacral edema.  Abdomen is soft nontender positive bowel sounds.  GU he does have an indwelling Foley catheter draining amber colored urine.  Neurologic is grossly intact I do not see any lateralizing findings her speech is .  Psych he appears to be alert and oriented pleasant and appropriate there had been some increased confusion previously and somewhat manic behavior but apparently this has improved    Labs.  08/16/2014.  Sodium 140 potassium 3.9 BUN 14 creatinine 1.61.  08/07/2014.  WBC 11.1 hemoglobin 10.2 platelets 218.  Upon phosphatase 128 albumin 2.9 otherwise liver function tests within normal limits.  Assessment and plan.  #1-increased edema with history of cardiomyopathy-patient appears to have some weight gain here and edema with a history of CHF-cautiously start diuretic Lasix 20 mg a day will have to keep a very close eye on his metabolic panel will recheck this on Monday, November 26-clinically he appears to be stable I suspect this will continue to be a balancing act with his electrolyte issues and renal function.  [id number]

## 2014-08-19 ENCOUNTER — Encounter: Payer: Self-pay | Admitting: Internal Medicine

## 2014-08-19 DIAGNOSIS — R609 Edema, unspecified: Secondary | ICD-10-CM | POA: Insufficient documentation

## 2014-08-22 ENCOUNTER — Other Ambulatory Visit: Payer: Self-pay

## 2014-08-22 ENCOUNTER — Encounter: Payer: Self-pay | Admitting: Neurology

## 2014-08-22 MED ORDER — HYDROCODONE-ACETAMINOPHEN 7.5-325 MG PO TABS: 1.0000 | ORAL_TABLET | Freq: Two times a day (BID) | ORAL | Status: DC

## 2014-08-22 NOTE — Telephone Encounter (Signed)
RX faxed to [facility] @ [phone]. Phone number [phone]  

## 2014-08-25 ENCOUNTER — Encounter: Payer: Self-pay | Admitting: Internal Medicine

## 2014-08-25 ENCOUNTER — Non-Acute Institutional Stay (SKILLED_NURSING_FACILITY): Payer: Medicare Other | Admitting: Internal Medicine

## 2014-08-25 DIAGNOSIS — R21 Rash and other nonspecific skin eruption: Secondary | ICD-10-CM | POA: Insufficient documentation

## 2014-08-25 DIAGNOSIS — I420 Dilated cardiomyopathy: Secondary | ICD-10-CM

## 2014-08-25 DIAGNOSIS — D649 Anemia, unspecified: Secondary | ICD-10-CM

## 2014-08-25 NOTE — Progress Notes (Signed)
Patient ID: Jordan Ward, male   DOB: 1940-10-26, 73 y.o.   MRN: [id number] Patient ID: Jordan Ward, male   DOB: [birth year], 73 y.o.   MRN: [id number]   This is an acute visit.  I level of care skilled.  Facility [facility].  Chief complaint-acute visit secondary to rash-follow-up renal insufficiency  History of present illness.  Patient is a pleasant 73 year old male with a complicated medical history including recent treatment for Pseudomonas UTI-also non-ST MI secondary to demand ischemia-also a history of acute on chronic renal failure with elevated sodium.  Despite this he appears to have done relatively well here latelyshe did receive aggressive IV hydration after his creatinine most significantly in this has normalized more to his baseline with a creatinine of 1.49 and BUN of 12 on member 23rd at sodium of 139 .  also noted by Dr. Dellia Nims that his hemoglobin dropped somewhat down to 9.6 previously had been around 10 he did order for guaiac stools 3 so far these have returned negative.  Patient is concerned today about a rash on his feet and lower back-he says this developed several days ago and itched he says it is somewhat better today-he does not appear to have had any new medications recently there is some question whether this could be more of a soap detergent related matter a contact dermatitis  .  S  He does have a history of dilated cardiomyopathy with an ejection fraction of 30-35 percent-he apparently had been on diuretics in the past but he was thought to be compensated as well as there were the renal issues so he was not discharged on any diuretic.  However patient developed some increased lower extremity edema especially was feet  and Lasix was cautiously started at 20 mg a day it appears his weight has stabilized most recently 125.3-his edema has essentially resolved-he does not complain of any chest pain or shortness of breath. .  Family medical social history as  been reviewed per admission note on 07/31/2014.  Medications have been reviewed per MAR.  Review of systems.  General no complaints of fever chills  Skin-does complain of a rash on his feet and lower back lower abdomen he says it itches somewhat better today than the last couple days.  Respirator does not complain of increased shortness of breath or cough.  Cardiac no chest pain edema appears largely resolved.  GI does not complain of nausea vomiting diarrhea constipation or abdominal discomfort.  GU he does have an indwelling Foley catheter-does not complain of dysuria.  Musculoskeletal does not complain of joint pain.  Neurologic does not complain of any headache dizziness or syncopal-type feelings.   Physical exam.  Temperature is 98.6 pulse 76 respirations 18 blood pressure 130/61 O2 saturation 97% on room air.  In general is a pleasant elderly male in no distress sitting comfortably in his wheelchair.  His skin is warm and dry--I note on the bottom part of his feet bilaterally there appears to be somewhat of a scaly petechial type rash there is also a scaly type rash in his lower back-this is not overtly erythematous.  Also complains of a rash on his arms this appears somewhat minimal although he does have some bruising which is chronic  Oropharynx clear mucous membranes moist.  .  Chest is clear to auscultation there is no labored breathing.  Heart is regular rate and rhythm without murmur gallop or rub he does have minimal lower extremity edema   Abdomen  is soft nontender positive bowel sounds.  GU he does have an indwelling Foley catheter draining amber colored urine.  Neurologic is grossly intact I do not see any lateralizing findings her speech is .  Psych he appears to be alert and oriented pleasant and appropriate there had been some increased confusion previously and somewhat manic behavior but apparently this has improved    Labs  08/21/2014.  WBC  8.6 hemoglobin 9.6 platelets 239.  Sodium 139 potassium 4 BUN 12 creatinine 1.49.  Liver function tests within normal limits except albumin of 2.9.  08/16/2014.  Sodium 140 potassium 3.9 BUN 14 creatinine 1.61.  08/07/2014.  WBC 11.1 hemoglobin 10.2 platelets 218.  Alk- phosphatase 128 albumin 2.9 otherwise liver function tests within normal limits.  Assessment and plan   1-rash-at this point will treat conservatively with a low-dose steroid cream and monitor for resolution this may be a contact type dermatitis apparently it is slowly improving we will monitor with this treatment.  #2-.  edema with history of cardiomyopathy-this appears fairly significantly improved it appears his weight has stabilized after some initial weight gain I suspect with fluid related-current weight is 125 which appears to be baseline with his previous weights before he accumulated edema-at this point will monitor will have to follow his renal function and sodium closely will update this   #3-anemia-we'll again order has been written to guaiac stools 3-so far these appear to be negative will update a CBC as well  [id number]

## 2014-08-29 DIAGNOSIS — E039 Hypothyroidism, unspecified: Secondary | ICD-10-CM | POA: Diagnosis not present

## 2014-08-29 DIAGNOSIS — N185 Chronic kidney disease, stage 5: Secondary | ICD-10-CM | POA: Diagnosis not present

## 2014-08-29 DIAGNOSIS — N189 Chronic kidney disease, unspecified: Secondary | ICD-10-CM | POA: Diagnosis not present

## 2014-08-29 DIAGNOSIS — K225 Diverticulum of esophagus, acquired: Secondary | ICD-10-CM | POA: Diagnosis not present

## 2014-08-29 DIAGNOSIS — G35 Multiple sclerosis: Secondary | ICD-10-CM | POA: Diagnosis not present

## 2014-08-29 DIAGNOSIS — I420 Dilated cardiomyopathy: Secondary | ICD-10-CM | POA: Diagnosis not present

## 2014-08-29 DIAGNOSIS — A419 Sepsis, unspecified organism: Secondary | ICD-10-CM | POA: Diagnosis not present

## 2014-08-29 DIAGNOSIS — Z9889 Other specified postprocedural states: Secondary | ICD-10-CM | POA: Diagnosis not present

## 2014-08-29 DIAGNOSIS — I129 Hypertensive chronic kidney disease with stage 1 through stage 4 chronic kidney disease, or unspecified chronic kidney disease: Secondary | ICD-10-CM | POA: Diagnosis not present

## 2014-08-29 DIAGNOSIS — I214 Non-ST elevation (NSTEMI) myocardial infarction: Secondary | ICD-10-CM | POA: Diagnosis not present

## 2014-08-29 DIAGNOSIS — R1314 Dysphagia, pharyngoesophageal phase: Secondary | ICD-10-CM | POA: Diagnosis present

## 2014-08-29 DIAGNOSIS — N289 Disorder of kidney and ureter, unspecified: Secondary | ICD-10-CM | POA: Diagnosis not present

## 2014-08-29 DIAGNOSIS — R488 Other symbolic dysfunctions: Secondary | ICD-10-CM | POA: Diagnosis not present

## 2014-08-29 DIAGNOSIS — M6281 Muscle weakness (generalized): Secondary | ICD-10-CM | POA: Diagnosis not present

## 2014-08-29 DIAGNOSIS — E038 Other specified hypothyroidism: Secondary | ICD-10-CM | POA: Diagnosis not present

## 2014-08-29 DIAGNOSIS — S91001A Unspecified open wound, right ankle, initial encounter: Secondary | ICD-10-CM | POA: Diagnosis not present

## 2014-08-29 DIAGNOSIS — R21 Rash and other nonspecific skin eruption: Secondary | ICD-10-CM | POA: Diagnosis not present

## 2014-08-29 DIAGNOSIS — F0633 Mood disorder due to known physiological condition with manic features: Secondary | ICD-10-CM | POA: Diagnosis not present

## 2014-08-29 DIAGNOSIS — R262 Difficulty in walking, not elsewhere classified: Secondary | ICD-10-CM | POA: Diagnosis not present

## 2014-08-29 DIAGNOSIS — R278 Other lack of coordination: Secondary | ICD-10-CM | POA: Diagnosis not present

## 2014-08-29 DIAGNOSIS — I10 Essential (primary) hypertension: Secondary | ICD-10-CM | POA: Diagnosis not present

## 2014-08-29 DIAGNOSIS — R1310 Dysphagia, unspecified: Secondary | ICD-10-CM | POA: Diagnosis not present

## 2014-08-29 DIAGNOSIS — R195 Other fecal abnormalities: Secondary | ICD-10-CM | POA: Diagnosis not present

## 2014-08-29 DIAGNOSIS — N184 Chronic kidney disease, stage 4 (severe): Secondary | ICD-10-CM | POA: Diagnosis not present

## 2014-08-29 DIAGNOSIS — N182 Chronic kidney disease, stage 2 (mild): Secondary | ICD-10-CM | POA: Diagnosis not present

## 2014-08-29 DIAGNOSIS — R609 Edema, unspecified: Secondary | ICD-10-CM | POA: Diagnosis not present

## 2014-08-29 DIAGNOSIS — F419 Anxiety disorder, unspecified: Secondary | ICD-10-CM | POA: Diagnosis not present

## 2014-08-29 DIAGNOSIS — N183 Chronic kidney disease, stage 3 (moderate): Secondary | ICD-10-CM | POA: Diagnosis not present

## 2014-09-04 ENCOUNTER — Ambulatory Visit (HOSPITAL_COMMUNITY)
Admit: 2014-09-04 | Discharge: 2014-09-04 | Disposition: A | Discharge status: Home / Self Care | Payer: No Typology Code available for payment source | Source: Ambulatory Visit | Attending: Internal Medicine | Admitting: Internal Medicine

## 2014-09-04 ENCOUNTER — Encounter (HOSPITAL_COMMUNITY): Payer: Self-pay | Admitting: Speech Pathology

## 2014-09-04 ENCOUNTER — Ambulatory Visit (HOSPITAL_COMMUNITY)
Admit: 2014-09-04 | Discharge: 2014-09-04 | Disposition: A | Discharge status: Home / Self Care | Payer: Medicare Other | Source: Ambulatory Visit | Attending: Internal Medicine | Admitting: Internal Medicine

## 2014-09-04 DIAGNOSIS — R1314 Dysphagia, pharyngoesophageal phase: Secondary | ICD-10-CM | POA: Diagnosis not present

## 2014-09-04 DIAGNOSIS — K225 Diverticulum of esophagus, acquired: Secondary | ICD-10-CM | POA: Diagnosis not present

## 2014-09-04 DIAGNOSIS — R1310 Dysphagia, unspecified: Secondary | ICD-10-CM | POA: Diagnosis not present

## 2014-09-04 NOTE — Therapy (Signed)
[facility] [address] [place], [place] [address] Phone: [phone] Fax: [phone]  Modified Barium Swallow  Patient Details  Name: Jordan Ward MRN: [id number] Date of Birth: 1940-11-15  Encounter Date: 09/04/2014      End of Session - 09/04/14 1710    Visit Number 1   Number of Visits 1   Authorization Type Medicare   SLP Start Time [id number]   SLP Stop Time  1340   SLP Time Calculation (min) 35 min   Activity Tolerance Patient tolerated treatment well      Past Medical History  Diagnosis Date  . Arthritis   . MS (multiple sclerosis)   . Insomnia   . PVD (peripheral vascular disease)   . Carotid artery stenosis   . TIA (transient ischemic attack)   . Chronic indwelling Foley catheter 10/06/2011  . Pulmonary fibrosis 10/06/2011  . Dysphagia 10/07/2011  . Tremors of nervous system 10/08/2011  . Hypothyroidism 10/08/2011  . Pulmonary nodule 10/08/2011  . C. difficile colitis 09/2011  . Pneumonia   . Encephalopathy   . Urinary tract infection   . Tremors of nervous system   . Hypokalemia   . Junctional rhythm   . Anemia   . Hypernatremia   . Sacral ulcer   . HTN (hypertension), malignant 10/06/2011  . Sleep apnea   . Neuromuscular disorder   . Back pain, chronic   . Kidney stones   . High grade dysplasia in colonic adenoma 09/2005  . Gait disorder   . OSA (obstructive sleep apnea)   . Paroxysmal atrial tachycardia   . Dyslipidemia   . Cerebrovascular disease   . CAD (coronary artery disease)   . Peripheral vascular disease   . Bilateral carotid bruits   . Colon polyps   . HA (headache)     Past Surgical History  Procedure Laterality Date  . Inguinal hernia repair  1971    bilateral  . Back surgery  1976/1979  . Colonoscopy  11/2004    Dr. Sharol Roussel sessile polyp splenic flexure, 49mm sessile polyp desc colon, tubulovillous adenoma (bx not removed)  . Colonoscopy  01/2005    poor prep, polyp could not be found  . Colonoscopy   05/2005    with EMR, polypectomy Dr. Olegario Messier, bx showed high grade dysplasia, partially resected  . Colonoscopy  09/2005    Dr. Arsenio Loader, [place] ink tattooing, four villous colon polyp (3 had been missed on previous colonoscopies due to limitations of procedures  . Colon surgery  09/2005    Fleishman: four tubular adenomas, large adenomatous polyp with HIGH GRADE dysplasia  . Appendectomy  09/2005    at time of left hemicolectomy  . Colonoscopy  09/2006    normal TI, no polyps  . Colonoscopy  10/2007    Dr. Imogene Burn distal mammillations, benign bx, normal TI, random bx neg for microscopic colitis  . Cholecystectomy      Dr. Tamala Julian  . Suprapubic catheter insertion      There were no vitals taken for this visit.  Visit Diagnosis: Dysphagia       Subjective Assessment - 09/04/14 1654    Symptoms Tired of honey-thick liquids   Special Tests MBSS   Currently in Pain? No/denies   Pain Score 0-No pain           Plan - 09/04/14 1710    Clinical Impression Statement Jordan Ward presents with mild oral phase and mild/mod pharyngeal phase dysphagia characterized by slow  oral prep, premature spillage with delay in swallow initiation triggering after filling the pyriforms with all liquid consistencies, decreased hyolaryngeal excursion, epiglottic deflection and tongue base retraction resulting in penetration/aspiration of thins during and after the swallow (no spontaneous cough, but cued cough removed deep penetrants/asiration from underside of cords), penetration during the swallow with nectars by cup- mild residuals noted on underside of epiglottis/valleculae/lateral channels which clear with cue to clear throat and repeat swallow; aspiration of nectars occurred when pt attempting to swallow barium tablet which became transiently delayed in the valleculae (pt unaware of same). Pt did not aspirate nectars when chin tuck and small sips implemented. Pt with trace underepiglottic coating with  honey thick liquids. Mild/mod vallecular residue noted with puree and solids, however when pt cued to "swallow hard" residuals were minimized.  Recommend D3/mech soft with nectar-thick liquids provided via small cup sips with chin tuck posture; clear throat after liquids and repeat/dry swallow. Continue with dysphagia therapy and repeat objective study in 4-6 weeks or as clinically appropriate.   Treatment/Interventions Aspiration precaution training;Pharyngeal strengthening exercises;Diet toleration management by SLP;Compensatory techniques;Trials of upgraded texture/liquids;SLP instruction and feedback;Patient/family education;Compensatory strategies   Potential to Achieve Goals Good   Consulted and Agree with Plan of Care Patient          G-Codes - September 06, 2014 1714    Functional Assessment Tool Used MBSS   Functional Limitations Swallowing   Swallow Current Status [id number]) At least 40 percent but less than 60 percent impaired, limited or restricted   Swallow Goal Status [id number]) At least 1 percent but less than 20 percent impaired, limited or restricted   Swallow Discharge Status [phone]) At least 40 percent but less than 60 percent impaired, limited or restricted           General - 2014-09-06 1655    General Information   Date of Onset 07/17/14   HPI Jordan Ward is a 73 yo gentleman who has advanced MS. He is referred by Dr. Dellia Nims from [facility] where he is currently residing following a 10-day stay at [facility] in late October. He had Pseudomonas sepsis. He completed his antibiotics here and, in that regard, he has done well. Follow up altered mental status, dehydration, hypernatremia.   Type of Study Modified Barium Swallowing Study   Reason for Referral Objectively evaluate swallowing function   Previous Swallow Assessment MBSS 06/2014 D3/HTL   Diet Prior to this Study Honey-thick liquids;Dysphagia 3 (soft)   Temperature Spikes Noted No   Respiratory Status Room air   History of  Recent Intubation No   Behavior/Cognition Alert;Cooperative   Oral Cavity - Dentition Poor condition   Oral Motor / Sensory Function Impaired - see Bedside swallow eval   Self-Feeding Abilities Able to feed self   Patient Positioning Upright in chair   Baseline Vocal Quality Clear   Volitional Cough Strong   Volitional Swallow Able to elicit   Anatomy Within functional limits   Pharyngeal Secretions Not observed secondary MBS          Oral Preparation/Oral Phase - 09/06/14 1658    Oral Preparation/Oral Phase   Oral Phase Impaired   Oral - Honey   Oral - Honey Cup Within functional limits   Oral - Nectar   Oral - Nectar Teaspoon Not tested   Oral - Nectar Cup Within functional limits   Oral - Thin   Oral - Thin Cup Within functional limits   Oral - Solids   Oral - Puree Delayed  oral transit   Oral - Mechanical Soft Not tested   Oral - Regular Impaired mastication;Weak lingual manipulation;Delayed oral transit;Piecemeal swallowing   Oral - Pill Delayed oral transit   Electrical stimulation - Oral Phase   Was Electrical Stimulation Used No          Pharyngeal Phase - 09/04/14 1700    Pharyngeal Phase   Pharyngeal Phase Impaired   Pharyngeal - Honey   Pharyngeal - Honey Cup Delayed swallow initiation;Premature spillage to pyriform sinuses;Reduced epiglottic inversion;Reduced laryngeal elevation;Reduced anterior laryngeal mobility;Penetration/Aspiration during swallow   Penetration/Aspiration details (honey cup) Material does not enter airway;Material enters airway, remains ABOVE vocal cords then ejected out   Pharyngeal - Nectar   Pharyngeal - Nectar Teaspoon Not tested   Pharyngeal - Nectar Cup Delayed swallow initiation;Premature spillage to pyriform sinuses;Reduced epiglottic inversion;Reduced anterior laryngeal mobility;Reduced laryngeal elevation;Reduced airway/laryngeal closure;Penetration/Aspiration during swallow;Penetration/Aspiration after swallow;Trace  aspiration;Pharyngeal residue - valleculae;Lateral channel residue  aspiration of nectars occurred when taking pill   Penetration/Aspiration details (nectar cup) Material enters airway, remains ABOVE vocal cords then ejected out;Material enters airway, passes BELOW cords and not ejected out despite cough attempt by patient;Material enters airway, remains ABOVE vocal cords and not ejected out   Pharyngeal - Thin   Pharyngeal - Thin Cup Delayed swallow initiation;Premature spillage to pyriform sinuses;Reduced epiglottic inversion;Reduced anterior laryngeal mobility;Reduced laryngeal elevation;Reduced airway/laryngeal closure;Reduced tongue base retraction;Penetration/Aspiration during swallow;Penetration/Aspiration after swallow;Trace aspiration;Pharyngeal residue - valleculae;Lateral channel residue   Penetration/Aspiration details (thin cup) Material enters airway, CONTACTS cords and not ejected out;Material enters airway, CONTACTS cords then ejected out  cued cough   Pharyngeal - Solids   Pharyngeal - Puree Delayed swallow initiation;Premature spillage to valleculae;Reduced epiglottic inversion;Reduced anterior laryngeal mobility;Reduced laryngeal elevation;Reduced tongue base retraction;Pharyngeal residue - valleculae;Pharyngeal residue - cp segment   Pharyngeal - Mechanical Soft Not tested   Pharyngeal - Regular Delayed swallow initiation;Premature spillage to valleculae;Reduced epiglottic inversion;Reduced anterior laryngeal mobility;Reduced laryngeal elevation;Reduced tongue base retraction;Pharyngeal residue - valleculae   Pharyngeal - Pill Delayed swallow initiation;Premature spillage to valleculae;Pharyngeal residue - valleculae  aspiration of nectars occurred when taking pill   Electrical Stimulation - Pharyngeal Phase   Was Electrical Stimulation Used No          Cricopharyngeal Phase - 09/04/14 1707    Cervical Esophageal Phase   Cervical Esophageal Phase Impaired   Cervical  Esophageal Phase - Solids   Puree Other (Comment)   Cervical Esophageal Phase - Comment   Cervical Esophageal Comment Presence of small Zenker's diverticulum confirmed by radiologist; no backflow into pharynx observed.          Recommendations/Treatment - 09/04/14 1708    Swallow Evaluation Recommendations   Diet Recommendations Dysphagia 3 (Mechanical Soft);Nectar-thick liquid   Liquid Administration via Cup   Medication Administration Crushed with puree   Supervision Patient able to self feed;Full supervision/cueing for compensatory strategies   Compensations Small sips/bites;Slow rate;Multiple dry swallows after each bite/sip;Clear throat intermittently;Effortful swallow   Postural Changes and/or Swallow Maneuvers Seated upright 90 degrees;Out of bed for meals;Upright 30-60 min after meal;Chin tuck   Oral Care Recommendations Oral care BID   Other Recommendations Order thickener from pharmacy   Follow up Recommendations Skilled Nursing facility          Prognosis - 09/04/14 1709    Prognosis   Prognosis for Safe Diet Advancement Good   Barriers to Reach Goals Severity of dysphagia   Barriers/Prognosis Comment long-standing history of dysphagia over the years in setting of MS  Individuals Consulted   Consulted and Agree with Results and Recommendations Patient   Report Sent to  Primary SLP;Facility (Comment);Referring physician        Problem List Patient Active Problem List   Diagnosis Date Noted  . Rash and nonspecific skin eruption 08/25/2014  . Edema 08/19/2014  . Acute renal insufficiency 08/08/2014  . Subacute confusional state 08/06/2014  . Dilated cardiomyopathy 07/21/2014  . Sepsis 07/17/2014  . Severe sepsis 07/17/2014  . Headache(784.0) 03/13/2014  . CVA (cerebral infarction) 03/13/2014  . Abnormality of gait 03/13/2014  . Tremors of nervous system 10/08/2011  . Hypothyroidism 10/08/2011  . Pulmonary nodule 10/08/2011  . Dysphagia 10/07/2011  .  Hypernatremia 10/07/2011  . HTN (hypertension), malignant 10/06/2011  . Chronic indwelling foley catheter 10/06/2011  . Pulmonary fibrosis 10/06/2011  . UTI (lower urinary tract infection) 10/03/2011  . C. difficile diarrhea 10/02/2011  . Hypokalemia 10/02/2011  . Dehydration 10/02/2011  . PNA (pneumonia) probable 10/02/2011  . Junctional rhythm 10/02/2011  . Multiple sclerosis 10/02/2011  . Anemia 06/18/2011  . Chronic diarrhea 06/17/2011  . Hx of adenomatous colonic polyps 06/17/2011  . High grade dysplasia in colonic adenoma 09/29/2005   Thank you,  Genene Churn, [place]  [place] 09/04/2014, 5:15 PM

## 2014-09-05 ENCOUNTER — Encounter: Payer: Self-pay | Admitting: Internal Medicine

## 2014-09-05 ENCOUNTER — Non-Acute Institutional Stay (SKILLED_NURSING_FACILITY): Payer: Medicare Other | Admitting: Internal Medicine

## 2014-09-05 DIAGNOSIS — R195 Other fecal abnormalities: Secondary | ICD-10-CM | POA: Insufficient documentation

## 2014-09-05 DIAGNOSIS — R21 Rash and other nonspecific skin eruption: Secondary | ICD-10-CM

## 2014-09-05 NOTE — Progress Notes (Signed)
Patient ID: Jordan Ward, male   DOB: [birth year], 73 y.o.   MRN: [id number]   This is an acute visit.  Level care skilled.  Facility is [facility].  Chief complaint-acute visit secondary to occult positive stool-rash right leg.  History of present illness.  Patient is a pleasant 73 year old male with a complicated medical history including MS-recent Pseudomonas infection require hospitalization-recent mental status changes which appear to have resolved.  Other medical issues include ejection fraction of 33% with a history of ischemic cardiomyopathy which has been stable as well-he is on low-dose Lasix.  Also had significant renal insufficiency but this appears to have improved to his baseline with a recent creatinine of 1.40 BUN of 17 which actually is an improvement for him  Recently orders were made to guaiac stools 3-family this was prompted by some drop in hemoglobin --appears hemoglobin had been as high as 12.7 at one point in the hospital although this may have been secondary to his significant renal insufficiency at the time dehydration-hemoglobin most recently 10.2 which actually stable to slightly improved compared tor recent levels  He did have guaiac negative stools but did have one occult positive result over the weekend.--He tells me he does have hemorrhoids occasionally-also he says occasionally he'll note some spot bleeding after a difficult bowel movement  He has been started on a proton pump inhibitor.--And aspirin was held pending physician review-he does have a history of ischemic cardiomyopathy  Currently  no complaints except  noticed a rash on his right lower leg today it does not itch he just found incidentally he states  Family medical social history as been reviewed per admission note on 08/02/2014.  Medications have been reviewed per MAR.  Review of systems.  General no complaints of fever or chills.  Skin other than the rash on his right leg has no  complaints.  Head ears eyes nose mouth and throat does not complain of any difficulty swallowing or sore throat.  Respiratory no complaints of shortness breath or cough.  Cardiac history of cardiomyopathy but edema appears to be quite stable very minimal today.  GI-does not complain of abdominal discomfort nausea or vomiting diarrhea or constipation.  GU does have a suprapubic catheter in place.  Does not complain of dysuria.  Musculoskeletal does not complain of joint pain.  Neurologic does not complain of dizziness or headache or syncopal-type feelings.  Psych-again did have some initial mental status changes which appear to have resolved.  Physical exam.  Temperature 98.4 pulse 70 respirations 20 blood pressure 105/82.  In general this is a pleasant elderly male in no distress sitting comfortably in his wheelchair.  His skin is warm and dry I do note somewhat of a a somewhat spiderweb-appearing rash on his right lower leg--this does not appear to be cellulitic it is linear.  Eyes has prescription lenses pupils appear reactive to light the are clear visual acuity grossly intact.  Oropharynx clear mucous membranes moist.  Chest is clear to auscultation there is no labored breathing.  Heart is regular rate and rhythm without murmur gallop or rub he does not appear to have any significant lower extremity edema.  Abdomen soft nontender positive bowel sounds.  Rectal-I did guaiac his stool it was negative-I did not note any external hemorrhoid  GU-catheter is draining somewhat dark Colored urine  Musculoskeletal moves all extremities 4 appears at baseline I did not note any deformities strength appears to be intact.  Neurologic is grossly intact to speech  is clear.  Psych he is alert and oriented pleasant and appropriate  Labs.  09/04/2014.  WBC 8.7 hemoglobin 10.2 platelets 260.  Sodium 139 potassium 4 BUN 17 creatinine 1.40.  Assessment and plan.  Number 1-1  episode of occult positive stool-patient's hemoglobin actually appears to be stable here-he has been started on a proton pump inhibitor-aspirin was held temporarily pending physician reviewed I did discuss this with Dr. Dellia Nims.--Will restart aspirin-clinically he appears stable hemoglobin has been stable-will update CBC next week-I expect patient's stay here to be relatively short-    #2-history renal insufficiency this appears to be stabilized he is back at his baseline Will recheck this next week he again is on low-dose Lasix.  #3-history of dermatitis right lower leg-at this point will start low-dose topical steroid twice a day when necessary and monitor for any resolution if no resolution certainly will have to readdress.  [id number]

## 2014-09-06 ENCOUNTER — Non-Acute Institutional Stay (SKILLED_NURSING_FACILITY): Payer: Medicare Other | Admitting: Internal Medicine

## 2014-09-06 DIAGNOSIS — N182 Chronic kidney disease, stage 2 (mild): Secondary | ICD-10-CM | POA: Diagnosis not present

## 2014-09-06 DIAGNOSIS — R195 Other fecal abnormalities: Secondary | ICD-10-CM

## 2014-09-06 DIAGNOSIS — N184 Chronic kidney disease, stage 4 (severe): Secondary | ICD-10-CM | POA: Diagnosis not present

## 2014-09-06 DIAGNOSIS — I129 Hypertensive chronic kidney disease with stage 1 through stage 4 chronic kidney disease, or unspecified chronic kidney disease: Secondary | ICD-10-CM | POA: Diagnosis not present

## 2014-09-06 DIAGNOSIS — N183 Chronic kidney disease, stage 3 (moderate): Secondary | ICD-10-CM

## 2014-09-06 DIAGNOSIS — N181 Chronic kidney disease, stage 1: Secondary | ICD-10-CM

## 2014-09-06 DIAGNOSIS — N185 Chronic kidney disease, stage 5: Secondary | ICD-10-CM | POA: Diagnosis not present

## 2014-09-06 DIAGNOSIS — S91001A Unspecified open wound, right ankle, initial encounter: Secondary | ICD-10-CM | POA: Diagnosis not present

## 2014-09-06 DIAGNOSIS — N189 Chronic kidney disease, unspecified: Secondary | ICD-10-CM | POA: Diagnosis not present

## 2014-09-09 NOTE — Progress Notes (Addendum)
Patient ID: Jordan Ward, male   DOB: Jun 09, 1941, 73 y.o.   MRN: [id number]               PROGRESS NOTE  DATE:  09/06/2014      FACILITY: [facility]    LEVEL OF CARE:   SNF   Acute Visit   CHIEF COMPLAINT:   Wound over his right lateral malleolus.     HISTORY OF PRESENT ILLNESS:  Jordan Ward is a gentleman with longstanding MS.  He came here after being admitted to hospital with Pseudomonas bacteremia and pyelonephritis.  He has a chronic suprapubic catheter.  I admitted him to the facility on 07/31/2014.    The major issue that we have encountered during his stay here has been acute renal failure with delirium.  Ultimately, I thought that this was a prerenal issue.  He simply was not drinking enough nectar-thick liquids.    He also has a history of presumably ischemic cardiomyopathy with an ejection fraction of 33% on a Myoview with inferolateral and distal anterior septal scars.  He is on beta blockers, nitrates, aspirin, and hydralazine.  He has not been on diuretics, nor does he seem to really require them.    With regards to the wound over his right lateral malleolus, he tells me that this has been here for a year.  I do not see this on my initial notes and it is really not likely to miss this.   Nevertheless, the wound care nurse tells me that this is not progressing towards healing and they have been using Santyl.    REVIEW OF SYSTEMS:   CHEST/RESPIRATORY:  No shortness of breath.  CARDIAC:   No chest pain.    GI:  Apparently, he has been upgraded in terms of his fluid consistency.     PHYSICAL EXAMINATION:   GENERAL APPEARANCE:  The patient is not in any distress.   CHEST/RESPIRATORY:  Clear air entry bilaterally.    CARDIOVASCULAR:  CARDIAC:  There is no evidence of heart failure.  He appears to be euvolemic.   SKIN:  INSPECTION:  Extremities:  Over the right lateral malleolus is an unstageable wound.  This has a tan-colored slough surface which probably needs to  be debrided.  There is no evidence of surrounding infection.  I note that he has a livedoid type rash on the right lower leg.  I do not remember this, either.   VASCULAR:   ARTERIAL:  Peripheral pulses are difficult to feel.  There may be a faint popliteal on the right.    ASSESSMENT/PLAN:                                  Wound over the right lateral malleolus.  This needs debridement.  We will see if we can do this here versus referral to a wound care center when he leaves here.  I think he is also going to need noninvasive vascular studies, as well.    Multiple sclerosis with dysphagia.  He appears to be improving.    He has baseline chronic renal insufficiency, presumably secondary to hypertension.  When last checked on 08/30/2014, his creatinine was 1.61.    Anemia which is normochromic, normocytic.   This is longstanding and apparently guaiac-positive.  Looking back through [facility], it does not appear as though his hemoglobin has changed all that much.  In fact, looking back  on his hemoglobin on 10/04/2011, this was 7.7.  I would like to turn this back to his primary doctor for evaluation.  He is guiac positive  Probable PAD.  I would arterial studies, including an ABI and duplex ultrasound.    The patient is apparently getting ready to go home next week.  I would continue him on Santyl.  He will need a wound care follow-up.

## 2014-09-13 ENCOUNTER — Encounter: Payer: Self-pay | Admitting: Internal Medicine

## 2014-09-13 ENCOUNTER — Non-Acute Institutional Stay (SKILLED_NURSING_FACILITY): Payer: Medicare Other | Admitting: Internal Medicine

## 2014-09-13 DIAGNOSIS — Z978 Presence of other specified devices: Secondary | ICD-10-CM

## 2014-09-13 DIAGNOSIS — G35 Multiple sclerosis: Secondary | ICD-10-CM

## 2014-09-13 DIAGNOSIS — I420 Dilated cardiomyopathy: Secondary | ICD-10-CM

## 2014-09-13 DIAGNOSIS — Z9889 Other specified postprocedural states: Secondary | ICD-10-CM

## 2014-09-13 DIAGNOSIS — R609 Edema, unspecified: Secondary | ICD-10-CM

## 2014-09-13 DIAGNOSIS — E038 Other specified hypothyroidism: Secondary | ICD-10-CM | POA: Diagnosis not present

## 2014-09-13 DIAGNOSIS — Z960 Presence of urogenital implants: Secondary | ICD-10-CM

## 2014-09-13 NOTE — Progress Notes (Signed)
Patient ID: Jordan Ward, male   DOB: 04-Dec-1940, 73 y.o.   MRN: [id number]  This is a discharge note.  Level care skilled.  Facility [facility].  Chief complaint discharge note  .   HISTORY OF PRESENT ILLNESS: Jordan Ward is a gentleman with longstanding MS. He came here after being admitted to hospital with Pseudomonas bacteremia and pyelonephritis. He has a chronic suprapubic catheter. I    The major issue that we have encountered during his stay here has been acute renal failure with delirium. UltimateyI thought that this was a prerenal issue. He simply was not drinking enough nectar-thick liquids--- this has improved fairly significantly he is been stable for some time most recent creatinine 1.48 which is essentially his baseline BUN of 28.   He also has a history of presumably ischemic cardiomyopathy with an ejection fraction of 33% on a Myoview with inferolateral and distal anterior septal scars. He is on beta blockers, nitrates, aspirin, and hydralazine.He is on low-dose course of Lasix with potassium supplementation weights have been stable to somewhat declining I suspect this is fluid related.  He has had stool that tested positive for occult blood-however his hemoglobin has been stable-he is on a proton pump inhibitor-per assessment of Jordan Ward will defer to primary care provider for follow-up since clinically he appears to be stable   With regards to the wound over his right lateral malleolus--this is followed by wound care apparently is improving will need follow-up by home health,  Patient will be going home with his wife-he will need PT OT as well as home health support-he is ambulating with a rolling walker and appears to be doing well with this.  Vital signs have been stable he appears to be doing well and is looking forward to going home-initially when he came here there was significant confusion but this has resolved unremarkably      Previous medical  history.  Pseudomonas UTI with bacteremia.  History of non-ST MI.  Renal insufficiency.  Hypernatremia.  Thrombocytopenia thought secondary to sepsis.  Hypertension.  Dysphagia.  History of chronic multiple sclerosis.  History pulmonary fibrosis.  Hypothyroidism.  History of chronic left-sided hydronephrosis renal calculi followed by urology and has a suprapubic catheter  History of guaiac-positive stool   Family medical social history reviewed per admission note on 07/31/2014.  Medications.  Aspirin 81 mg daily-.  Baclofen 10 mg 3 times a day when necessary.  Coreg 6.25 mg twice a day.  Imdur 30 mg daily.  Lasix 20 mg daily.  Synthroid 100 g daily.  Lipitor 40 mg daily.  Norco 7.5-325 mg twice a day.  Prilosec 20 mg daily.  Valium 5 mg twice a day.  Potassium 20 mEq daily.   Marland Kitchen   REVIEW OF SYSTEMS: General no complaints of fever or chills.  Skin does not complain of rashes or itching has a lateral right ankle wound followed by wound care currently covered.  Head ears eyes nose mouth and throat-has prescription lenses does not complain of visual changes or sore throat.    CHEST/RESPIRATORY: No shortness of breath.  CARDIAC: No chest pain or edema--.  GI: Apparently, he has been upgraded in terms of his fluid consistency--not complain of any abdominal discomfort nausea vomiting diarrhea or constipation.  Musculoskeletal has gained strength although still has some weakness is ambulating with a walker does not complain of joint pain.  Neurologic does not complain of dizziness or headache or syncopal-type feelings.  Psych had initial confusion  but this appears to have resolved he is pleasant appropriate.   PHYSICAL EXAMINATION Temperature 98.6-pulse 84-respirations 20-blood pressure 132/86-weight 126.4 :  GENERAL APPEARANCE: The patient is not in any distress. His skin is warm and dry there is a dressing applied over his lateral  right ankle wound again this is followed by wound care.  Head ears eyes nose mouth and throat-pupils are reactive to light he has prescription lenses visual acuity appears intact.  Oropharynx clear mucous membranes moist  CHEST/RESPIRATORY: Clear air entry bilaterally.  CARDIOVASCULAR:  CARDIAC: There is no evidence of heart failure. He appears to be euvolemic. We will rate and rhythm with somewhat distant heart sounds.  GU-has a chronic indwelling Foley catheter looks like the bag has just been emptied  Abdomen is protuberant which is baseline soft nontender with positive bowel sounds.  Musculoskeletal could not appreciate any deformities he ambulates with a rolling walker he does not have significant lower extremity edema  Neurologic is grossly intact speech is clear.  Psych he appears alert and oriented pleasant and appropriate certainly improved from his initial assessment     Labs.  09/11/2014.  Sodium 147 potassium 3.5 BUN 28 creatinine 1.48.  WBC 8.7 hemoglobin 9.8 platelets 202.     ASSESSMENT/PLAN:   Wound over the right lateral malleolus.   This has been followed by wound care apparently having topical treatment applied this will need follow-up by home health apparently is stable.   Multiple sclerosis with dysphagia. He appears to be improving.   He has baseline chronic renal insufficiency, presumably secondary to hypertension. This appears stable most recent creatinine 1.48-I do note a sodium of 147-will update this before discharge  -clinically he appears to be doing significantly better than on admission.   Anemia which is normochromic, normocytic. This is longstanding and apparently guaiac-positive. Looking back through [facility], it does not appear as though his hemoglobin has changed all that much. In fact, looking back on his hemoglobin on 10/04/2011, this was 7.7. I would like to turn this  back to his primary doctor for evaluation. He is guiac positive  History of cardiomyopathy with ejection fraction of 33%-his weight appears to be stable somewhat declining I suspect this is fluid related-he is on low-dose Lasix-I do note sodium is mildly elevated this will need updating later in the week by home health.  Hypothyroidism---continues on supplementation--- TSH was elevated at 31.244 on November 9-adjustments were made to Synthroid-will update TSH before discharge.      Again patient will be on home with his wife will need again PT OT and home support for his numerous medical issues as well as weakness-also will need an updated metabolic panel later this week ---he will need a rolling walker for ambulation he appears to be doing well with this  [id number]-of note greater than 30 minutes spent on this discharge summary       .

## 2014-09-17 DIAGNOSIS — L89310 Pressure ulcer of right buttock, unstageable: Secondary | ICD-10-CM | POA: Diagnosis not present

## 2014-09-17 DIAGNOSIS — Z87891 Personal history of nicotine dependence: Secondary | ICD-10-CM | POA: Diagnosis not present

## 2014-09-17 DIAGNOSIS — I429 Cardiomyopathy, unspecified: Secondary | ICD-10-CM | POA: Diagnosis not present

## 2014-09-17 DIAGNOSIS — I10 Essential (primary) hypertension: Secondary | ICD-10-CM | POA: Diagnosis not present

## 2014-09-17 DIAGNOSIS — G35 Multiple sclerosis: Secondary | ICD-10-CM | POA: Diagnosis not present

## 2014-09-17 DIAGNOSIS — L89513 Pressure ulcer of right ankle, stage 3: Secondary | ICD-10-CM | POA: Diagnosis not present

## 2014-09-17 DIAGNOSIS — R1310 Dysphagia, unspecified: Secondary | ICD-10-CM | POA: Diagnosis not present

## 2014-09-17 DIAGNOSIS — Z435 Encounter for attention to cystostomy: Secondary | ICD-10-CM | POA: Diagnosis not present

## 2014-09-17 DIAGNOSIS — R339 Retention of urine, unspecified: Secondary | ICD-10-CM | POA: Diagnosis not present

## 2014-09-17 DIAGNOSIS — Z87440 Personal history of urinary (tract) infections: Secondary | ICD-10-CM | POA: Diagnosis not present

## 2014-09-18 DIAGNOSIS — L89513 Pressure ulcer of right ankle, stage 3: Secondary | ICD-10-CM | POA: Diagnosis not present

## 2014-09-18 DIAGNOSIS — G35 Multiple sclerosis: Secondary | ICD-10-CM | POA: Diagnosis not present

## 2014-09-18 DIAGNOSIS — L89310 Pressure ulcer of right buttock, unstageable: Secondary | ICD-10-CM | POA: Diagnosis not present

## 2014-09-18 DIAGNOSIS — R339 Retention of urine, unspecified: Secondary | ICD-10-CM | POA: Diagnosis not present

## 2014-09-18 DIAGNOSIS — I10 Essential (primary) hypertension: Secondary | ICD-10-CM | POA: Diagnosis not present

## 2014-09-18 DIAGNOSIS — R1310 Dysphagia, unspecified: Secondary | ICD-10-CM | POA: Diagnosis not present

## 2014-09-19 DIAGNOSIS — R1310 Dysphagia, unspecified: Secondary | ICD-10-CM | POA: Diagnosis not present

## 2014-09-19 DIAGNOSIS — I10 Essential (primary) hypertension: Secondary | ICD-10-CM | POA: Diagnosis not present

## 2014-09-19 DIAGNOSIS — G35 Multiple sclerosis: Secondary | ICD-10-CM | POA: Diagnosis not present

## 2014-09-19 DIAGNOSIS — L89310 Pressure ulcer of right buttock, unstageable: Secondary | ICD-10-CM | POA: Diagnosis not present

## 2014-09-19 DIAGNOSIS — L89513 Pressure ulcer of right ankle, stage 3: Secondary | ICD-10-CM | POA: Diagnosis not present

## 2014-09-19 DIAGNOSIS — R339 Retention of urine, unspecified: Secondary | ICD-10-CM | POA: Diagnosis not present

## 2014-09-23 DIAGNOSIS — G35 Multiple sclerosis: Secondary | ICD-10-CM | POA: Diagnosis not present

## 2014-09-23 DIAGNOSIS — R339 Retention of urine, unspecified: Secondary | ICD-10-CM | POA: Diagnosis not present

## 2014-09-23 DIAGNOSIS — L89513 Pressure ulcer of right ankle, stage 3: Secondary | ICD-10-CM | POA: Diagnosis not present

## 2014-09-23 DIAGNOSIS — I10 Essential (primary) hypertension: Secondary | ICD-10-CM | POA: Diagnosis not present

## 2014-09-23 DIAGNOSIS — R1310 Dysphagia, unspecified: Secondary | ICD-10-CM | POA: Diagnosis not present

## 2014-09-23 DIAGNOSIS — L89310 Pressure ulcer of right buttock, unstageable: Secondary | ICD-10-CM | POA: Diagnosis not present

## 2014-09-25 DIAGNOSIS — G35 Multiple sclerosis: Secondary | ICD-10-CM | POA: Diagnosis not present

## 2014-09-25 DIAGNOSIS — L89513 Pressure ulcer of right ankle, stage 3: Secondary | ICD-10-CM | POA: Diagnosis not present

## 2014-09-25 DIAGNOSIS — L89310 Pressure ulcer of right buttock, unstageable: Secondary | ICD-10-CM | POA: Diagnosis not present

## 2014-09-25 DIAGNOSIS — R339 Retention of urine, unspecified: Secondary | ICD-10-CM | POA: Diagnosis not present

## 2014-09-25 DIAGNOSIS — I10 Essential (primary) hypertension: Secondary | ICD-10-CM | POA: Diagnosis not present

## 2014-09-25 DIAGNOSIS — R1310 Dysphagia, unspecified: Secondary | ICD-10-CM | POA: Diagnosis not present

## 2014-09-26 DIAGNOSIS — L89513 Pressure ulcer of right ankle, stage 3: Secondary | ICD-10-CM | POA: Diagnosis not present

## 2014-09-26 DIAGNOSIS — R339 Retention of urine, unspecified: Secondary | ICD-10-CM | POA: Diagnosis not present

## 2014-09-26 DIAGNOSIS — G35 Multiple sclerosis: Secondary | ICD-10-CM | POA: Diagnosis not present

## 2014-09-26 DIAGNOSIS — R1310 Dysphagia, unspecified: Secondary | ICD-10-CM | POA: Diagnosis not present

## 2014-09-26 DIAGNOSIS — L89310 Pressure ulcer of right buttock, unstageable: Secondary | ICD-10-CM | POA: Diagnosis not present

## 2014-09-26 DIAGNOSIS — I10 Essential (primary) hypertension: Secondary | ICD-10-CM | POA: Diagnosis not present

## 2014-09-27 DIAGNOSIS — L89310 Pressure ulcer of right buttock, unstageable: Secondary | ICD-10-CM | POA: Diagnosis not present

## 2014-09-27 DIAGNOSIS — G35 Multiple sclerosis: Secondary | ICD-10-CM | POA: Diagnosis not present

## 2014-09-27 DIAGNOSIS — R339 Retention of urine, unspecified: Secondary | ICD-10-CM | POA: Diagnosis not present

## 2014-09-27 DIAGNOSIS — L89513 Pressure ulcer of right ankle, stage 3: Secondary | ICD-10-CM | POA: Diagnosis not present

## 2014-09-27 DIAGNOSIS — R1310 Dysphagia, unspecified: Secondary | ICD-10-CM | POA: Diagnosis not present

## 2014-09-27 DIAGNOSIS — I10 Essential (primary) hypertension: Secondary | ICD-10-CM | POA: Diagnosis not present

## 2014-09-28 DIAGNOSIS — R339 Retention of urine, unspecified: Secondary | ICD-10-CM | POA: Diagnosis not present

## 2014-09-28 DIAGNOSIS — I10 Essential (primary) hypertension: Secondary | ICD-10-CM | POA: Diagnosis not present

## 2014-09-28 DIAGNOSIS — L89513 Pressure ulcer of right ankle, stage 3: Secondary | ICD-10-CM | POA: Diagnosis not present

## 2014-09-28 DIAGNOSIS — R1310 Dysphagia, unspecified: Secondary | ICD-10-CM | POA: Diagnosis not present

## 2014-09-28 DIAGNOSIS — G35 Multiple sclerosis: Secondary | ICD-10-CM | POA: Diagnosis not present

## 2014-09-28 DIAGNOSIS — L89310 Pressure ulcer of right buttock, unstageable: Secondary | ICD-10-CM | POA: Diagnosis not present

## 2014-10-02 DIAGNOSIS — G35 Multiple sclerosis: Secondary | ICD-10-CM | POA: Diagnosis not present

## 2014-10-02 DIAGNOSIS — R339 Retention of urine, unspecified: Secondary | ICD-10-CM | POA: Diagnosis not present

## 2014-10-02 DIAGNOSIS — L89310 Pressure ulcer of right buttock, unstageable: Secondary | ICD-10-CM | POA: Diagnosis not present

## 2014-10-02 DIAGNOSIS — I10 Essential (primary) hypertension: Secondary | ICD-10-CM | POA: Diagnosis not present

## 2014-10-02 DIAGNOSIS — L89513 Pressure ulcer of right ankle, stage 3: Secondary | ICD-10-CM | POA: Diagnosis not present

## 2014-10-02 DIAGNOSIS — R1310 Dysphagia, unspecified: Secondary | ICD-10-CM | POA: Diagnosis not present

## 2014-10-02 NOTE — Addendum Note (Signed)
Encounter addended by: Ephraim Hamburger, CCC-SLP on: 10/02/2014 12:23 PM<BR>     Documentation filed: Clinical Notes

## 2014-10-03 ENCOUNTER — Ambulatory Visit (INDEPENDENT_AMBULATORY_CARE_PROVIDER_SITE_OTHER): Payer: Medicare Other | Admitting: Urology

## 2014-10-03 DIAGNOSIS — R3914 Feeling of incomplete bladder emptying: Secondary | ICD-10-CM

## 2014-10-04 DIAGNOSIS — J8410 Pulmonary fibrosis, unspecified: Secondary | ICD-10-CM | POA: Diagnosis not present

## 2014-10-04 DIAGNOSIS — G35 Multiple sclerosis: Secondary | ICD-10-CM | POA: Diagnosis not present

## 2014-10-04 DIAGNOSIS — L89513 Pressure ulcer of right ankle, stage 3: Secondary | ICD-10-CM | POA: Diagnosis not present

## 2014-10-04 DIAGNOSIS — L89310 Pressure ulcer of right buttock, unstageable: Secondary | ICD-10-CM | POA: Diagnosis not present

## 2014-10-04 DIAGNOSIS — I10 Essential (primary) hypertension: Secondary | ICD-10-CM | POA: Diagnosis not present

## 2014-10-04 DIAGNOSIS — L97901 Non-pressure chronic ulcer of unspecified part of unspecified lower leg limited to breakdown of skin: Secondary | ICD-10-CM | POA: Diagnosis not present

## 2014-10-04 DIAGNOSIS — R339 Retention of urine, unspecified: Secondary | ICD-10-CM | POA: Diagnosis not present

## 2014-10-04 DIAGNOSIS — R1310 Dysphagia, unspecified: Secondary | ICD-10-CM | POA: Diagnosis not present

## 2014-10-04 DIAGNOSIS — N390 Urinary tract infection, site not specified: Secondary | ICD-10-CM | POA: Diagnosis not present

## 2014-10-05 DIAGNOSIS — L89310 Pressure ulcer of right buttock, unstageable: Secondary | ICD-10-CM | POA: Diagnosis not present

## 2014-10-05 DIAGNOSIS — I10 Essential (primary) hypertension: Secondary | ICD-10-CM | POA: Diagnosis not present

## 2014-10-05 DIAGNOSIS — R1310 Dysphagia, unspecified: Secondary | ICD-10-CM | POA: Diagnosis not present

## 2014-10-05 DIAGNOSIS — R339 Retention of urine, unspecified: Secondary | ICD-10-CM | POA: Diagnosis not present

## 2014-10-05 DIAGNOSIS — G35 Multiple sclerosis: Secondary | ICD-10-CM | POA: Diagnosis not present

## 2014-10-05 DIAGNOSIS — L89513 Pressure ulcer of right ankle, stage 3: Secondary | ICD-10-CM | POA: Diagnosis not present

## 2014-10-09 DIAGNOSIS — R1310 Dysphagia, unspecified: Secondary | ICD-10-CM | POA: Diagnosis not present

## 2014-10-09 DIAGNOSIS — R339 Retention of urine, unspecified: Secondary | ICD-10-CM | POA: Diagnosis not present

## 2014-10-09 DIAGNOSIS — L89310 Pressure ulcer of right buttock, unstageable: Secondary | ICD-10-CM | POA: Diagnosis not present

## 2014-10-09 DIAGNOSIS — L89513 Pressure ulcer of right ankle, stage 3: Secondary | ICD-10-CM | POA: Diagnosis not present

## 2014-10-09 DIAGNOSIS — G35 Multiple sclerosis: Secondary | ICD-10-CM | POA: Diagnosis not present

## 2014-10-09 DIAGNOSIS — I10 Essential (primary) hypertension: Secondary | ICD-10-CM | POA: Diagnosis not present

## 2014-10-10 DIAGNOSIS — L89513 Pressure ulcer of right ankle, stage 3: Secondary | ICD-10-CM | POA: Diagnosis not present

## 2014-10-10 DIAGNOSIS — L89310 Pressure ulcer of right buttock, unstageable: Secondary | ICD-10-CM | POA: Diagnosis not present

## 2014-10-10 DIAGNOSIS — R1310 Dysphagia, unspecified: Secondary | ICD-10-CM | POA: Diagnosis not present

## 2014-10-10 DIAGNOSIS — R339 Retention of urine, unspecified: Secondary | ICD-10-CM | POA: Diagnosis not present

## 2014-10-10 DIAGNOSIS — I10 Essential (primary) hypertension: Secondary | ICD-10-CM | POA: Diagnosis not present

## 2014-10-10 DIAGNOSIS — G35 Multiple sclerosis: Secondary | ICD-10-CM | POA: Diagnosis not present

## 2014-10-11 ENCOUNTER — Other Ambulatory Visit (HOSPITAL_COMMUNITY): Payer: Self-pay | Admitting: Pulmonary Disease

## 2014-10-11 DIAGNOSIS — I10 Essential (primary) hypertension: Secondary | ICD-10-CM | POA: Diagnosis not present

## 2014-10-11 DIAGNOSIS — R339 Retention of urine, unspecified: Secondary | ICD-10-CM | POA: Diagnosis not present

## 2014-10-11 DIAGNOSIS — G35 Multiple sclerosis: Secondary | ICD-10-CM | POA: Diagnosis not present

## 2014-10-11 DIAGNOSIS — R1310 Dysphagia, unspecified: Secondary | ICD-10-CM

## 2014-10-11 DIAGNOSIS — L89513 Pressure ulcer of right ankle, stage 3: Secondary | ICD-10-CM | POA: Diagnosis not present

## 2014-10-11 DIAGNOSIS — L89310 Pressure ulcer of right buttock, unstageable: Secondary | ICD-10-CM | POA: Diagnosis not present

## 2014-10-12 ENCOUNTER — Other Ambulatory Visit (HOSPITAL_COMMUNITY): Payer: Self-pay | Admitting: Pulmonary Disease

## 2014-10-12 DIAGNOSIS — R1310 Dysphagia, unspecified: Secondary | ICD-10-CM

## 2014-10-16 DIAGNOSIS — G35 Multiple sclerosis: Secondary | ICD-10-CM | POA: Diagnosis not present

## 2014-10-16 DIAGNOSIS — R339 Retention of urine, unspecified: Secondary | ICD-10-CM | POA: Diagnosis not present

## 2014-10-16 DIAGNOSIS — I10 Essential (primary) hypertension: Secondary | ICD-10-CM | POA: Diagnosis not present

## 2014-10-16 DIAGNOSIS — R1310 Dysphagia, unspecified: Secondary | ICD-10-CM | POA: Diagnosis not present

## 2014-10-16 DIAGNOSIS — L89513 Pressure ulcer of right ankle, stage 3: Secondary | ICD-10-CM | POA: Diagnosis not present

## 2014-10-16 DIAGNOSIS — L89310 Pressure ulcer of right buttock, unstageable: Secondary | ICD-10-CM | POA: Diagnosis not present

## 2014-10-17 ENCOUNTER — Other Ambulatory Visit: Payer: Self-pay | Admitting: Family Medicine

## 2014-10-17 DIAGNOSIS — R4702 Dysphasia: Secondary | ICD-10-CM

## 2014-10-17 DIAGNOSIS — K7460 Unspecified cirrhosis of liver: Secondary | ICD-10-CM

## 2014-10-18 DIAGNOSIS — L89310 Pressure ulcer of right buttock, unstageable: Secondary | ICD-10-CM | POA: Diagnosis not present

## 2014-10-18 DIAGNOSIS — R1310 Dysphagia, unspecified: Secondary | ICD-10-CM | POA: Diagnosis not present

## 2014-10-18 DIAGNOSIS — G35 Multiple sclerosis: Secondary | ICD-10-CM | POA: Diagnosis not present

## 2014-10-18 DIAGNOSIS — I10 Essential (primary) hypertension: Secondary | ICD-10-CM | POA: Diagnosis not present

## 2014-10-18 DIAGNOSIS — R339 Retention of urine, unspecified: Secondary | ICD-10-CM | POA: Diagnosis not present

## 2014-10-18 DIAGNOSIS — L89513 Pressure ulcer of right ankle, stage 3: Secondary | ICD-10-CM | POA: Diagnosis not present

## 2014-10-19 ENCOUNTER — Encounter: Payer: Self-pay | Admitting: Internal Medicine

## 2014-10-19 ENCOUNTER — Other Ambulatory Visit (HOSPITAL_COMMUNITY): Payer: Self-pay | Admitting: Pulmonary Disease

## 2014-10-19 DIAGNOSIS — R1310 Dysphagia, unspecified: Secondary | ICD-10-CM

## 2014-10-24 DIAGNOSIS — G35 Multiple sclerosis: Secondary | ICD-10-CM | POA: Diagnosis not present

## 2014-10-24 DIAGNOSIS — L89513 Pressure ulcer of right ankle, stage 3: Secondary | ICD-10-CM | POA: Diagnosis not present

## 2014-10-24 DIAGNOSIS — R339 Retention of urine, unspecified: Secondary | ICD-10-CM | POA: Diagnosis not present

## 2014-10-24 DIAGNOSIS — L89310 Pressure ulcer of right buttock, unstageable: Secondary | ICD-10-CM | POA: Diagnosis not present

## 2014-10-24 DIAGNOSIS — R1310 Dysphagia, unspecified: Secondary | ICD-10-CM | POA: Diagnosis not present

## 2014-10-24 DIAGNOSIS — I10 Essential (primary) hypertension: Secondary | ICD-10-CM | POA: Diagnosis not present

## 2014-10-25 ENCOUNTER — Ambulatory Visit (HOSPITAL_COMMUNITY)
Admission: RE | Admit: 2014-10-25 | Discharge: 2014-10-25 | Disposition: A | Discharge status: Home / Self Care | Payer: Medicare Other | Source: Ambulatory Visit | Attending: Pulmonary Disease | Admitting: Pulmonary Disease

## 2014-10-25 ENCOUNTER — Other Ambulatory Visit (HOSPITAL_COMMUNITY): Payer: Self-pay | Admitting: Pulmonary Disease

## 2014-10-25 ENCOUNTER — Ambulatory Visit (HOSPITAL_COMMUNITY)
Admission: RE | Admit: 2014-10-25 | Discharge: 2014-10-25 | Disposition: A | Discharge status: Home / Self Care | Payer: Medicare Other | Source: Ambulatory Visit | Attending: Internal Medicine | Admitting: Internal Medicine

## 2014-10-25 DIAGNOSIS — K225 Diverticulum of esophagus, acquired: Secondary | ICD-10-CM | POA: Diagnosis not present

## 2014-10-25 DIAGNOSIS — N390 Urinary tract infection, site not specified: Secondary | ICD-10-CM | POA: Diagnosis not present

## 2014-10-25 DIAGNOSIS — R1310 Dysphagia, unspecified: Secondary | ICD-10-CM

## 2014-10-25 DIAGNOSIS — R112 Nausea with vomiting, unspecified: Secondary | ICD-10-CM | POA: Diagnosis not present

## 2014-10-25 DIAGNOSIS — R1314 Dysphagia, pharyngoesophageal phase: Secondary | ICD-10-CM | POA: Diagnosis not present

## 2014-10-25 DIAGNOSIS — G35 Multiple sclerosis: Secondary | ICD-10-CM | POA: Diagnosis not present

## 2014-10-25 DIAGNOSIS — J8410 Pulmonary fibrosis, unspecified: Secondary | ICD-10-CM | POA: Diagnosis not present

## 2014-10-26 DIAGNOSIS — L89310 Pressure ulcer of right buttock, unstageable: Secondary | ICD-10-CM | POA: Diagnosis not present

## 2014-10-26 DIAGNOSIS — G35 Multiple sclerosis: Secondary | ICD-10-CM | POA: Diagnosis not present

## 2014-10-26 DIAGNOSIS — L89513 Pressure ulcer of right ankle, stage 3: Secondary | ICD-10-CM | POA: Diagnosis not present

## 2014-10-26 DIAGNOSIS — I10 Essential (primary) hypertension: Secondary | ICD-10-CM | POA: Diagnosis not present

## 2014-10-26 DIAGNOSIS — R1310 Dysphagia, unspecified: Secondary | ICD-10-CM | POA: Diagnosis not present

## 2014-10-26 DIAGNOSIS — R339 Retention of urine, unspecified: Secondary | ICD-10-CM | POA: Diagnosis not present

## 2014-10-26 NOTE — Therapy (Signed)
[place], [place], [address] Phone: [phone]   Fax:  [phone]  Modified Barium Swallow  Patient Details  Name: Jordan Ward MRN: [id number] Date of Birth: 08/29/1941 Referring Provider:  [name], MD  Encounter Date: 10/25/2014      End of Session - 10/25/14 1540    Visit Number 1   Number of Visits 1   Authorization Type Medicare   SLP Start Time 1450   SLP Stop Time  1512   SLP Time Calculation (min) 22 min   Activity Tolerance Patient tolerated treatment well      Past Medical History  Diagnosis Date  . Arthritis   . MS (multiple sclerosis)   . Insomnia   . PVD (peripheral vascular disease)   . Carotid artery stenosis   . TIA (transient ischemic attack)   . Chronic indwelling Foley catheter 10/06/2011  . Pulmonary fibrosis 10/06/2011  . Dysphagia 10/07/2011  . Tremors of nervous system 10/08/2011  . Hypothyroidism 10/08/2011  . Pulmonary nodule 10/08/2011  . C. difficile colitis 09/2011  . Pneumonia   . Encephalopathy   . Urinary tract infection   . Tremors of nervous system   . Hypokalemia   . Junctional rhythm   . Anemia   . Hypernatremia   . Sacral ulcer   . HTN (hypertension), malignant 10/06/2011  . Sleep apnea   . Neuromuscular disorder   . Back pain, chronic   . Kidney stones   . High grade dysplasia in colonic adenoma 09/2005  . Gait disorder   . OSA (obstructive sleep apnea)   . Paroxysmal atrial tachycardia   . Dyslipidemia   . Cerebrovascular disease   . CAD (coronary artery disease)   . Peripheral vascular disease   . Bilateral carotid bruits   . Colon polyps   . HA (headache)     Past Surgical History  Procedure Laterality Date  . Inguinal hernia repair  1971    bilateral  . Back surgery  1976/1979  . Colonoscopy  11/2004    Dr. Sharol Roussel sessile polyp splenic flexure, 68mm sessile polyp desc colon, tubulovillous adenoma (bx not removed)  . Colonoscopy   01/2005    poor prep, polyp could not be found  . Colonoscopy  05/2005    with EMR, polypectomy Dr. Olegario Messier, bx showed high grade dysplasia, partially resected  . Colonoscopy  09/2005    Dr. Arsenio Loader, [place] ink tattooing, four villous colon polyp (3 had been missed on previous colonoscopies due to limitations of procedures  . Colon surgery  09/2005    Fleishman: four tubular adenomas, large adenomatous polyp with HIGH GRADE dysplasia  . Appendectomy  09/2005    at time of left hemicolectomy  . Colonoscopy  09/2006    normal TI, no polyps  . Colonoscopy  10/2007    Dr. Imogene Burn distal mammillations, benign bx, normal TI, random bx neg for microscopic colitis  . Cholecystectomy      Dr. Tamala Julian  . Suprapubic catheter insertion      There were no vitals taken for this visit.  Visit Diagnosis: Dysphagia, pharyngoesophageal phase      Subjective Assessment - 10/25/14 1500    Symptoms "I'd like to drink regular liquids."   Special Tests MBSS   Currently in Pain? No/denies             General - 10/25/14 1500    General Information   Date of Onset  07/17/14   HPI [name] is a 74 yo gentleman who has a history of MS. He is referred by Dr. Luan Ward for MBSS following a recent hospitalization at [facility] and rehabilitation at [facility]. He had Pseudomonas sepsis, altered mental status, dehydration, and hypernatremia. He is currently receiving home health SLP therapy for dysphagia. His most recent MBSS completed in December 2015, demonstrated aspiration and recommendation for mechanical soft and nectar-thick liquids was made. Pt is hopeful about being upgraded to unrestricted liquids. Mr. [name] required diet texture modifications in 2013, but was able to resume a regular diet with thin liquids prior to his most recent hospitalization.    Type of Study Modified Barium Swallowing Study   Reason for Referral Objectively evaluate swallowing function   Previous Swallow Assessment MBSS  09/04/2014   Diet Prior to this Study Dysphagia 3 (soft);Nectar-thick liquids   Temperature Spikes Noted No   Respiratory Status Room air   History of Recent Intubation No   Behavior/Cognition Alert;Cooperative   Oral Cavity - Dentition Poor condition   Oral Motor / Sensory Function Within functional limits   Self-Feeding Abilities Able to feed self   Patient Positioning Upright in chair   Baseline Vocal Quality Clear   Volitional Cough Strong   Volitional Swallow Able to elicit   Anatomy Within functional limits  see note about Zenker's diverticulum in GI   Pharyngeal Secretions Not observed secondary MBS            Oral Preparation/Oral Phase - 10/25/14 1510    Oral Preparation/Oral Phase   Oral Phase Impaired   Oral - Honey   Oral - Honey Cup Not tested   Oral - Nectar   Oral - Nectar Teaspoon Not tested   Oral - Nectar Cup Within functional limits   Oral - Thin   Oral - Thin Cup Within functional limits   Oral - Solids   Oral - Puree Within functional limits   Oral - Mechanical Soft Not tested   Oral - Regular Within functional limits   Oral - Pill Within functional limits  taken with nectar   Electrical stimulation - Oral Phase   Was Electrical Stimulation Used No          Pharyngeal Phase - 10/26/14 1326    Pharyngeal - Honey   Pharyngeal - Honey Cup Not tested   Pharyngeal - Nectar   Pharyngeal - Nectar Teaspoon Not tested   Pharyngeal - Nectar Cup Premature spillage to valleculae;Pharyngeal residue - valleculae   Penetration/Aspiration details (nectar cup) Material does not enter airway   Pharyngeal - Thin   Pharyngeal - Thin Cup Delayed swallow initiation;Premature spillage to valleculae;Premature spillage to pyriform sinuses;Reduced airway/laryngeal closure;Reduced tongue base retraction;Penetration/Aspiration during swallow;Moderate aspiration;Pharyngeal residue - valleculae   Penetration/Aspiration details (thin cup) Material does not enter  airway;Material enters airway, remains ABOVE vocal cords then ejected out;Material enters airway, passes BELOW cords and not ejected out despite cough attempt by patient   Pharyngeal - Solids   Pharyngeal - Puree Premature spillage to valleculae;Reduced tongue base retraction;Reduced anterior laryngeal mobility;Pharyngeal residue - valleculae;Pharyngeal residue - cp segment   Pharyngeal - Mechanical Soft Not tested   Pharyngeal - Regular Delayed swallow initiation;Premature spillage to valleculae;Reduced pharyngeal peristalsis;Reduced epiglottic inversion;Reduced anterior laryngeal mobility;Reduced tongue base retraction;Pharyngeal residue - valleculae;Pharyngeal residue - pyriform sinuses  mod residue in valleculae, min in pyriforms   Pharyngeal - Pill Delayed swallow initiation;Premature spillage to valleculae;Pharyngeal residue - valleculae   Electrical Stimulation - Pharyngeal  Phase   Was Electrical Stimulation Used No          Cricopharyngeal Phase - 11/08/2014 1511    Cervical Esophageal Phase   Cervical Esophageal Phase Impaired   Cervical Esophageal Phase - Solids   Puree Other (Comment)  retention in Zenker's diverticulum   Cervical Esophageal Phase - Comment   Cervical Esophageal Comment Zenker's diverticulum identified and confirmed by radiologist; retention of liquids and puree, however no backflow into pharynx observed.                  Plan - 11-08-14 1620    Clinical Impression Statement Mr. Accomando presents with a mild/mod sensorimotor based pharyngeal dysphagia characterized by delay in swallow initiation, decreased tongue base retraction, decreased hyolaryngeal excursion, and decreased airway protection resulting in swallow trigger after filling the valleculae, penetration of thins during the swallow, aspiration of thins during/after the swallow x1 with only small spontaneous throat clear which was not effective in removing aspirate, residuals post swallow in  valleculae (mild with liquids and moderate with solid texture), and mild residuals in pyriforms/UES with puree and solids. Pt was not sensate to substantial vallecular residue and stated that "everything went down" when it had not. Mr. Norwick has trouble initiating a voluntary swallow at times when cued to repeat swallow to clear. Vallecular residue was reduced to trace when pt was cued to "swallow hard" with solids. Aspiration of thins is more likely to occur when pt is taking larger sips and with mixed consistencies. Recommend continuing mechanical soft textures with nectar-thick liquids during meals; ok for pt to have thin liquids when not eating foods- pt should take small sip, clear throat, and dry swallow when drinking thin liquids. When pt utilized this strategy, he was able to prevent penetrated material from falling beneath the vocal folds. Pt continues to be at risk for aspiration, however with implementation of the above strategies, risks are minimized. Zenker's diverticulum was identified and confirmed by radiologist, however no backflow into pharynx was observed.   Treatment/Interventions Aspiration precaution training;Pharyngeal strengthening exercises;Diet toleration management by SLP;Compensatory techniques;Trials of upgraded texture/liquids;SLP instruction and feedback;Patient/family education;Compensatory strategies   Potential to Achieve Goals Good   Consulted and Agree with Plan of Care Patient          G-Codes - 2014/11/08 1548    Functional Assessment Tool Used MBSS   Functional Limitations Swallowing   Swallow Current Status [id number]) At least 20 percent but less than 40 percent impaired, limited or restricted   Swallow Goal Status [id number]) At least 1 percent but less than 20 percent impaired, limited or restricted   Swallow Discharge Status [id number]) At least 20 percent but less than 40 percent impaired, limited or restricted          Recommendations/Treatment - 2014/11/08 1337     Swallow Evaluation Recommendations   Diet Recommendations Dysphagia 3 (Mechanical Soft);Nectar-thick liquid  ok for thins between meals   Liquid Administration via Cup   Medication Administration Crushed with puree   Supervision Patient able to self feed;Intermittent supervision to cue for compensatory strategies   Compensations Slow rate;Small sips/bites;Multiple dry swallows after each bite/sip;Effortful swallow  throat clear after sips thin   Postural Changes and/or Swallow Maneuvers Seated upright 90 degrees;Upright 30-60 min after meal   Oral Care Recommendations Oral care BID   Other Recommendations Order thickener from pharmacy   Follow up Recommendations Home health SLP          Prognosis - 2014/11/08 1539  Prognosis   Prognosis for Safe Diet Advancement Good   Barriers to Reach Goals Severity of dysphagia   Barriers/Prognosis Comment long-standing history of dysphagia over the years in setting of MS   Individuals Consulted   Consulted and Agree with Results and Recommendations Patient   Report Sent to  Primary SLP;Referring physician      Problem List Patient Active Problem List   Diagnosis Date Noted  . Occult blood positive stool 09/05/2014  . Rash and nonspecific skin eruption 08/25/2014  . Edema 08/19/2014  . Acute renal insufficiency 08/08/2014  . Subacute confusional state 08/06/2014  . Dilated cardiomyopathy 07/21/2014  . Sepsis 07/17/2014  . Severe sepsis 07/17/2014  . Headache(784.0) 03/13/2014  . CVA (cerebral infarction) 03/13/2014  . Abnormality of gait 03/13/2014  . Tremors of nervous system 10/08/2011  . Hypothyroidism 10/08/2011  . Pulmonary nodule 10/08/2011  . Dysphagia 10/07/2011  . Hypernatremia 10/07/2011  . HTN (hypertension), malignant 10/06/2011  . Chronic indwelling foley catheter 10/06/2011  . Pulmonary fibrosis 10/06/2011  . UTI (lower urinary tract infection) 10/03/2011  . C. difficile diarrhea 10/02/2011  . Hypokalemia  10/02/2011  . Dehydration 10/02/2011  . PNA (pneumonia) probable 10/02/2011  . Junctional rhythm 10/02/2011  . Multiple sclerosis 10/02/2011  . Anemia 06/18/2011  . Chronic diarrhea 06/17/2011  . Hx of adenomatous colonic polyps 06/17/2011  . High grade dysplasia in colonic adenoma 09/29/2005   Thank you,  Genene Churn, [place]  [facility] 10/25/2014, 4:20 PM  [place] [address] [place], [place], [address] Phone: [phone]   Fax:  [phone]

## 2014-10-27 DIAGNOSIS — R339 Retention of urine, unspecified: Secondary | ICD-10-CM | POA: Diagnosis not present

## 2014-10-27 DIAGNOSIS — I10 Essential (primary) hypertension: Secondary | ICD-10-CM | POA: Diagnosis not present

## 2014-10-27 DIAGNOSIS — L89513 Pressure ulcer of right ankle, stage 3: Secondary | ICD-10-CM | POA: Diagnosis not present

## 2014-10-27 DIAGNOSIS — R1310 Dysphagia, unspecified: Secondary | ICD-10-CM | POA: Diagnosis not present

## 2014-10-27 DIAGNOSIS — G35 Multiple sclerosis: Secondary | ICD-10-CM | POA: Diagnosis not present

## 2014-10-27 DIAGNOSIS — L89310 Pressure ulcer of right buttock, unstageable: Secondary | ICD-10-CM | POA: Diagnosis not present

## 2014-10-30 DIAGNOSIS — L89310 Pressure ulcer of right buttock, unstageable: Secondary | ICD-10-CM | POA: Diagnosis not present

## 2014-10-30 DIAGNOSIS — R1310 Dysphagia, unspecified: Secondary | ICD-10-CM | POA: Diagnosis not present

## 2014-10-30 DIAGNOSIS — L89513 Pressure ulcer of right ankle, stage 3: Secondary | ICD-10-CM | POA: Diagnosis not present

## 2014-10-30 DIAGNOSIS — G35 Multiple sclerosis: Secondary | ICD-10-CM | POA: Diagnosis not present

## 2014-10-30 DIAGNOSIS — R339 Retention of urine, unspecified: Secondary | ICD-10-CM | POA: Diagnosis not present

## 2014-10-30 DIAGNOSIS — I10 Essential (primary) hypertension: Secondary | ICD-10-CM | POA: Diagnosis not present

## 2014-10-31 DIAGNOSIS — I10 Essential (primary) hypertension: Secondary | ICD-10-CM | POA: Diagnosis not present

## 2014-10-31 DIAGNOSIS — G35 Multiple sclerosis: Secondary | ICD-10-CM | POA: Diagnosis not present

## 2014-10-31 DIAGNOSIS — L89310 Pressure ulcer of right buttock, unstageable: Secondary | ICD-10-CM | POA: Diagnosis not present

## 2014-10-31 DIAGNOSIS — R339 Retention of urine, unspecified: Secondary | ICD-10-CM | POA: Diagnosis not present

## 2014-10-31 DIAGNOSIS — R1310 Dysphagia, unspecified: Secondary | ICD-10-CM | POA: Diagnosis not present

## 2014-10-31 DIAGNOSIS — L89513 Pressure ulcer of right ankle, stage 3: Secondary | ICD-10-CM | POA: Diagnosis not present

## 2014-11-01 DIAGNOSIS — L89513 Pressure ulcer of right ankle, stage 3: Secondary | ICD-10-CM | POA: Diagnosis not present

## 2014-11-01 DIAGNOSIS — R339 Retention of urine, unspecified: Secondary | ICD-10-CM | POA: Diagnosis not present

## 2014-11-01 DIAGNOSIS — I10 Essential (primary) hypertension: Secondary | ICD-10-CM | POA: Diagnosis not present

## 2014-11-01 DIAGNOSIS — R1310 Dysphagia, unspecified: Secondary | ICD-10-CM | POA: Diagnosis not present

## 2014-11-01 DIAGNOSIS — G35 Multiple sclerosis: Secondary | ICD-10-CM | POA: Diagnosis not present

## 2014-11-01 DIAGNOSIS — L89310 Pressure ulcer of right buttock, unstageable: Secondary | ICD-10-CM | POA: Diagnosis not present

## 2014-11-06 DIAGNOSIS — L89310 Pressure ulcer of right buttock, unstageable: Secondary | ICD-10-CM | POA: Diagnosis not present

## 2014-11-06 DIAGNOSIS — L89513 Pressure ulcer of right ankle, stage 3: Secondary | ICD-10-CM | POA: Diagnosis not present

## 2014-11-06 DIAGNOSIS — G35 Multiple sclerosis: Secondary | ICD-10-CM | POA: Diagnosis not present

## 2014-11-06 DIAGNOSIS — I10 Essential (primary) hypertension: Secondary | ICD-10-CM | POA: Diagnosis not present

## 2014-11-06 DIAGNOSIS — R339 Retention of urine, unspecified: Secondary | ICD-10-CM | POA: Diagnosis not present

## 2014-11-06 DIAGNOSIS — R1310 Dysphagia, unspecified: Secondary | ICD-10-CM | POA: Diagnosis not present

## 2014-11-07 ENCOUNTER — Ambulatory Visit: Payer: Medicare Other | Admitting: Nurse Practitioner

## 2014-11-07 ENCOUNTER — Telehealth: Payer: Self-pay | Admitting: Nurse Practitioner

## 2014-11-07 ENCOUNTER — Ambulatory Visit (INDEPENDENT_AMBULATORY_CARE_PROVIDER_SITE_OTHER): Payer: Medicare Other | Admitting: Urology

## 2014-11-07 ENCOUNTER — Encounter: Payer: Self-pay | Admitting: Nurse Practitioner

## 2014-11-07 DIAGNOSIS — R3914 Feeling of incomplete bladder emptying: Secondary | ICD-10-CM | POA: Diagnosis not present

## 2014-11-07 NOTE — Telephone Encounter (Signed)
PATIENT WAS A NO SHOW 11/07/14 AND LETTER WAS SENT

## 2014-11-07 NOTE — Telephone Encounter (Signed)
Noted  

## 2014-11-08 DIAGNOSIS — I10 Essential (primary) hypertension: Secondary | ICD-10-CM | POA: Diagnosis not present

## 2014-11-08 DIAGNOSIS — L89513 Pressure ulcer of right ankle, stage 3: Secondary | ICD-10-CM | POA: Diagnosis not present

## 2014-11-08 DIAGNOSIS — R1310 Dysphagia, unspecified: Secondary | ICD-10-CM | POA: Diagnosis not present

## 2014-11-08 DIAGNOSIS — L89310 Pressure ulcer of right buttock, unstageable: Secondary | ICD-10-CM | POA: Diagnosis not present

## 2014-11-08 DIAGNOSIS — G35 Multiple sclerosis: Secondary | ICD-10-CM | POA: Diagnosis not present

## 2014-11-08 DIAGNOSIS — R339 Retention of urine, unspecified: Secondary | ICD-10-CM | POA: Diagnosis not present

## 2014-11-15 DIAGNOSIS — R339 Retention of urine, unspecified: Secondary | ICD-10-CM | POA: Diagnosis not present

## 2014-11-15 DIAGNOSIS — I10 Essential (primary) hypertension: Secondary | ICD-10-CM | POA: Diagnosis not present

## 2014-11-15 DIAGNOSIS — G35 Multiple sclerosis: Secondary | ICD-10-CM | POA: Diagnosis not present

## 2014-11-15 DIAGNOSIS — R1310 Dysphagia, unspecified: Secondary | ICD-10-CM | POA: Diagnosis not present

## 2014-11-15 DIAGNOSIS — L89513 Pressure ulcer of right ankle, stage 3: Secondary | ICD-10-CM | POA: Diagnosis not present

## 2014-11-15 DIAGNOSIS — L89310 Pressure ulcer of right buttock, unstageable: Secondary | ICD-10-CM | POA: Diagnosis not present

## 2014-11-16 DIAGNOSIS — I10 Essential (primary) hypertension: Secondary | ICD-10-CM | POA: Diagnosis not present

## 2014-11-16 DIAGNOSIS — R1310 Dysphagia, unspecified: Secondary | ICD-10-CM | POA: Diagnosis not present

## 2014-11-16 DIAGNOSIS — R339 Retention of urine, unspecified: Secondary | ICD-10-CM | POA: Diagnosis not present

## 2014-11-16 DIAGNOSIS — I429 Cardiomyopathy, unspecified: Secondary | ICD-10-CM | POA: Diagnosis not present

## 2014-11-16 DIAGNOSIS — Z87440 Personal history of urinary (tract) infections: Secondary | ICD-10-CM | POA: Diagnosis not present

## 2014-11-16 DIAGNOSIS — L89513 Pressure ulcer of right ankle, stage 3: Secondary | ICD-10-CM | POA: Diagnosis not present

## 2014-11-16 DIAGNOSIS — Z87891 Personal history of nicotine dependence: Secondary | ICD-10-CM | POA: Diagnosis not present

## 2014-11-16 DIAGNOSIS — Z435 Encounter for attention to cystostomy: Secondary | ICD-10-CM | POA: Diagnosis not present

## 2014-11-16 DIAGNOSIS — G35 Multiple sclerosis: Secondary | ICD-10-CM | POA: Diagnosis not present

## 2014-11-28 DIAGNOSIS — Z7982 Long term (current) use of aspirin: Secondary | ICD-10-CM | POA: Diagnosis not present

## 2014-11-28 DIAGNOSIS — I872 Venous insufficiency (chronic) (peripheral): Secondary | ICD-10-CM | POA: Diagnosis not present

## 2014-11-28 DIAGNOSIS — I739 Peripheral vascular disease, unspecified: Secondary | ICD-10-CM | POA: Diagnosis not present

## 2014-11-28 DIAGNOSIS — Z881 Allergy status to other antibiotic agents status: Secondary | ICD-10-CM | POA: Diagnosis not present

## 2014-11-28 DIAGNOSIS — L97519 Non-pressure chronic ulcer of other part of right foot with unspecified severity: Secondary | ICD-10-CM | POA: Diagnosis not present

## 2014-11-28 DIAGNOSIS — G35 Multiple sclerosis: Secondary | ICD-10-CM | POA: Diagnosis not present

## 2014-11-28 DIAGNOSIS — Z882 Allergy status to sulfonamides status: Secondary | ICD-10-CM | POA: Diagnosis not present

## 2014-11-28 DIAGNOSIS — Z79899 Other long term (current) drug therapy: Secondary | ICD-10-CM | POA: Diagnosis not present

## 2014-11-28 DIAGNOSIS — L97312 Non-pressure chronic ulcer of right ankle with fat layer exposed: Secondary | ICD-10-CM | POA: Diagnosis not present

## 2014-11-28 DIAGNOSIS — L97319 Non-pressure chronic ulcer of right ankle with unspecified severity: Secondary | ICD-10-CM | POA: Diagnosis not present

## 2014-11-28 DIAGNOSIS — I83013 Varicose veins of right lower extremity with ulcer of ankle: Secondary | ICD-10-CM | POA: Diagnosis not present

## 2014-11-28 DIAGNOSIS — Z79891 Long term (current) use of opiate analgesic: Secondary | ICD-10-CM | POA: Diagnosis not present

## 2014-12-05 ENCOUNTER — Ambulatory Visit: Payer: Medicare Other | Admitting: Urology

## 2014-12-07 ENCOUNTER — Other Ambulatory Visit: Payer: Self-pay | Admitting: Neurology

## 2014-12-07 NOTE — Telephone Encounter (Addendum)
Last OV note from June says: I explained that we need to see the patient in the next 6-7 months approximately, if not we will no longer be able to prescribe diazepam.  I have tried to call patient, but got no answer.

## 2014-12-08 ENCOUNTER — Ambulatory Visit (INDEPENDENT_AMBULATORY_CARE_PROVIDER_SITE_OTHER): Payer: Medicare Other | Admitting: Urology

## 2014-12-08 ENCOUNTER — Other Ambulatory Visit: Payer: Self-pay | Admitting: Neurology

## 2014-12-08 DIAGNOSIS — R3914 Feeling of incomplete bladder emptying: Secondary | ICD-10-CM | POA: Diagnosis not present

## 2014-12-08 NOTE — Telephone Encounter (Signed)
I called again.  Spoke with patient who wil check calendar and cal [place] back to schedule appt.

## 2014-12-08 NOTE — Telephone Encounter (Signed)
Pt called back for the refills. I called in to his pharmacy in [facility]. I talked with wife about next Monday to call office for appointment with Dr. Eugenie Birks soon. Wife expressed understanding and appreciation.  Rosalin Hawking, MD PhD Stroke Neurology 12/08/2014 2:46 PM

## 2014-12-11 DIAGNOSIS — I743 Embolism and thrombosis of arteries of the lower extremities: Secondary | ICD-10-CM | POA: Diagnosis not present

## 2014-12-11 DIAGNOSIS — M79604 Pain in right leg: Secondary | ICD-10-CM | POA: Diagnosis not present

## 2014-12-11 DIAGNOSIS — L97319 Non-pressure chronic ulcer of right ankle with unspecified severity: Secondary | ICD-10-CM | POA: Diagnosis not present

## 2014-12-11 DIAGNOSIS — I83013 Varicose veins of right lower extremity with ulcer of ankle: Secondary | ICD-10-CM | POA: Diagnosis not present

## 2014-12-11 DIAGNOSIS — I779 Disorder of arteries and arterioles, unspecified: Secondary | ICD-10-CM | POA: Diagnosis not present

## 2014-12-11 DIAGNOSIS — M79605 Pain in left leg: Secondary | ICD-10-CM | POA: Diagnosis not present

## 2014-12-12 DIAGNOSIS — J8410 Pulmonary fibrosis, unspecified: Secondary | ICD-10-CM | POA: Diagnosis not present

## 2014-12-12 DIAGNOSIS — I10 Essential (primary) hypertension: Secondary | ICD-10-CM | POA: Diagnosis not present

## 2014-12-12 DIAGNOSIS — L97901 Non-pressure chronic ulcer of unspecified part of unspecified lower leg limited to breakdown of skin: Secondary | ICD-10-CM | POA: Diagnosis not present

## 2014-12-12 DIAGNOSIS — G35 Multiple sclerosis: Secondary | ICD-10-CM | POA: Diagnosis not present

## 2014-12-14 DIAGNOSIS — Z882 Allergy status to sulfonamides status: Secondary | ICD-10-CM | POA: Diagnosis not present

## 2014-12-14 DIAGNOSIS — Z7982 Long term (current) use of aspirin: Secondary | ICD-10-CM | POA: Diagnosis not present

## 2014-12-14 DIAGNOSIS — I83013 Varicose veins of right lower extremity with ulcer of ankle: Secondary | ICD-10-CM | POA: Diagnosis not present

## 2014-12-14 DIAGNOSIS — I872 Venous insufficiency (chronic) (peripheral): Secondary | ICD-10-CM | POA: Diagnosis not present

## 2014-12-14 DIAGNOSIS — L97319 Non-pressure chronic ulcer of right ankle with unspecified severity: Secondary | ICD-10-CM | POA: Diagnosis not present

## 2014-12-14 DIAGNOSIS — Z79891 Long term (current) use of opiate analgesic: Secondary | ICD-10-CM | POA: Diagnosis not present

## 2014-12-14 DIAGNOSIS — L97312 Non-pressure chronic ulcer of right ankle with fat layer exposed: Secondary | ICD-10-CM | POA: Diagnosis not present

## 2014-12-14 DIAGNOSIS — Z79899 Other long term (current) drug therapy: Secondary | ICD-10-CM | POA: Diagnosis not present

## 2014-12-14 DIAGNOSIS — G35 Multiple sclerosis: Secondary | ICD-10-CM | POA: Diagnosis not present

## 2014-12-14 DIAGNOSIS — Z881 Allergy status to other antibiotic agents status: Secondary | ICD-10-CM | POA: Diagnosis not present

## 2014-12-14 DIAGNOSIS — L97519 Non-pressure chronic ulcer of other part of right foot with unspecified severity: Secondary | ICD-10-CM | POA: Diagnosis not present

## 2014-12-14 DIAGNOSIS — I739 Peripheral vascular disease, unspecified: Secondary | ICD-10-CM | POA: Diagnosis not present

## 2014-12-20 ENCOUNTER — Other Ambulatory Visit: Payer: Self-pay | Admitting: *Deleted

## 2014-12-20 DIAGNOSIS — I83009 Varicose veins of unspecified lower extremity with ulcer of unspecified site: Secondary | ICD-10-CM

## 2014-12-20 DIAGNOSIS — I739 Peripheral vascular disease, unspecified: Secondary | ICD-10-CM

## 2014-12-20 DIAGNOSIS — L97909 Non-pressure chronic ulcer of unspecified part of unspecified lower leg with unspecified severity: Principal | ICD-10-CM

## 2015-01-05 ENCOUNTER — Encounter: Payer: Self-pay | Admitting: Adult Health

## 2015-01-05 ENCOUNTER — Ambulatory Visit (INDEPENDENT_AMBULATORY_CARE_PROVIDER_SITE_OTHER): Payer: Medicare Other | Admitting: Adult Health

## 2015-01-05 VITALS — BP 145/86 | HR 79 | Ht 65.0 in | Wt 117.0 lb

## 2015-01-05 DIAGNOSIS — R51 Headache: Secondary | ICD-10-CM | POA: Diagnosis not present

## 2015-01-05 DIAGNOSIS — R519 Headache, unspecified: Secondary | ICD-10-CM

## 2015-01-05 DIAGNOSIS — N319 Neuromuscular dysfunction of bladder, unspecified: Secondary | ICD-10-CM

## 2015-01-05 DIAGNOSIS — G9349 Other encephalopathy: Secondary | ICD-10-CM | POA: Diagnosis not present

## 2015-01-05 DIAGNOSIS — R269 Unspecified abnormalities of gait and mobility: Secondary | ICD-10-CM

## 2015-01-05 DIAGNOSIS — IMO0002 Reserved for concepts with insufficient information to code with codable children: Secondary | ICD-10-CM

## 2015-01-05 MED ORDER — DIAZEPAM 5 MG PO TABS: ORAL_TABLET | ORAL | Status: DC
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
## 2015-01-05 NOTE — Progress Notes (Signed)
I have read the note, and I agree with the clinical assessment and plan.  WILLIS,CHARLES KEITH   

## 2015-01-05 NOTE — Patient Instructions (Signed)
Continue Valium We consider MRI of the brain/spine at the next visit.

## 2015-01-09 ENCOUNTER — Encounter: Payer: Self-pay | Admitting: Vascular Surgery

## 2015-01-09 ENCOUNTER — Ambulatory Visit (INDEPENDENT_AMBULATORY_CARE_PROVIDER_SITE_OTHER): Payer: Medicare Other | Admitting: Urology

## 2015-01-09 DIAGNOSIS — R3914 Feeling of incomplete bladder emptying: Secondary | ICD-10-CM

## 2015-01-10 ENCOUNTER — Encounter: Payer: Self-pay | Admitting: Vascular Surgery

## 2015-01-10 ENCOUNTER — Ambulatory Visit (HOSPITAL_COMMUNITY)
Admission: RE | Admit: 2015-01-10 | Discharge: 2015-01-10 | Disposition: A | Discharge status: Home / Self Care | Payer: Medicare Other | Source: Ambulatory Visit | Attending: Vascular Surgery | Admitting: Vascular Surgery

## 2015-01-10 ENCOUNTER — Ambulatory Visit (INDEPENDENT_AMBULATORY_CARE_PROVIDER_SITE_OTHER): Payer: Medicare Other | Admitting: Vascular Surgery

## 2015-01-10 VITALS — BP 161/102 | HR 72 | Ht 65.0 in | Wt 118.2 lb

## 2015-01-10 DIAGNOSIS — L97909 Non-pressure chronic ulcer of unspecified part of unspecified lower leg with unspecified severity: Secondary | ICD-10-CM

## 2015-01-10 DIAGNOSIS — I739 Peripheral vascular disease, unspecified: Secondary | ICD-10-CM

## 2015-01-10 DIAGNOSIS — R0989 Other specified symptoms and signs involving the circulatory and respiratory systems: Secondary | ICD-10-CM | POA: Diagnosis not present

## 2015-01-10 DIAGNOSIS — I70299 Other atherosclerosis of native arteries of extremities, unspecified extremity: Secondary | ICD-10-CM

## 2015-01-10 DIAGNOSIS — I83009 Varicose veins of unspecified lower extremity with ulcer of unspecified site: Secondary | ICD-10-CM

## 2015-01-10 NOTE — Addendum Note (Signed)
Addended by: Mena Goes on: 01/10/2015 04:55 PM   Modules accepted: Orders

## 2015-01-10 NOTE — Progress Notes (Signed)
[facility]  Patient name: Jordan Ward MRN: [id number] DOB: Jul 15, 1941 Sex: male  REASON FOR CONSULT: wounds right foot with multilevel arterial occlusive disease  HPI: Jordan Ward is a 74 y.o. male was had wounds on his right foot for approximately a year although recently the wounds have improved and now have healed. He denies any history of claudication or rest pain. However, his activity is very limited. He is ambulatory with a walker. He has been in the hospital with recurrent UTIs and has a suprapubic catheter. He is followed at the wound care center and was sent for vascular evaluation.  He did show me a picture from an arteriogram 20 years ago which shows that he has an aortic occlusion.  I have reviewed the records from the [facility] wound healing center. The patient does have a history of multiple sclerosis. Patient also has a history of delirium.  Past Medical History  Diagnosis Date  . Arthritis   . MS (multiple sclerosis)   . Insomnia   . PVD (peripheral vascular disease)   . Carotid artery stenosis   . TIA (transient ischemic attack)   . Chronic indwelling Foley catheter 10/06/2011  . Pulmonary fibrosis 10/06/2011  . Dysphagia 10/07/2011  . Tremors of nervous system 10/08/2011  . Hypothyroidism 10/08/2011  . Pulmonary nodule 10/08/2011  . C. difficile colitis 09/2011  . Pneumonia   . Encephalopathy   . Urinary tract infection   . Tremors of nervous system   . Hypokalemia   . Junctional rhythm   . Anemia   . Hypernatremia   . Sacral ulcer   . HTN (hypertension), malignant 10/06/2011  . Sleep apnea   . Neuromuscular disorder   . Back pain, chronic   . Kidney stones   . High grade dysplasia in colonic adenoma 09/2005  . Gait disorder   . OSA (obstructive sleep apnea)   . Paroxysmal atrial tachycardia   . Dyslipidemia   . Cerebrovascular disease   . CAD (coronary artery disease)   . Peripheral vascular disease   . Bilateral carotid bruits    . Colon polyps   . HA (headache)    Family History  Problem Relation Age of Onset  . Colon cancer Neg Hx   . Cirrhosis Brother     etoh  . Stroke Mother 103  . Coronary artery disease Father 58  . Heart attack Brother   . Multiple sclerosis     SOCIAL HISTORY: History  Substance Use Topics  . Smoking status: Former Smoker    Types: Cigarettes  . Smokeless tobacco: Never Used     Comment: quit remote  . Alcohol Use: No   Allergies  Allergen Reactions  . Tetracyclines & Related Anaphylaxis and Rash  . Ciprofloxacin     Trouble swallowing   Current Outpatient Prescriptions  Medication Sig Dispense Refill  . aspirin 81 MG chewable tablet Chew 1 tablet (81 mg total) by mouth daily.    . diazepam (VALIUM) 5 MG tablet TAKE 1 TABLET BY MOUTH TWICE A DAY 60 tablet 0  . HYDROcodone-acetaminophen (NORCO) 7.5-325 MG per tablet Take 1 tablet by mouth 2 (two) times daily. Max APAP 3 GM IN 24 HOURS FROM ALL SOURCES 60 tablet 0  . levothyroxine (SYNTHROID, LEVOTHROID) 88 MCG tablet Take 88 mcg by mouth daily before breakfast.    . potassium chloride SA (K-DUR,KLOR-CON) 20 MEQ tablet Take 20 mEq by mouth daily.    . verapamil (  CALAN) 40 MG tablet Take 40 mg by mouth 2 (two) times daily.    . carvedilol (COREG) 6.25 MG tablet Take 1 tablet (6.25 mg total) by mouth 2 (two) times daily with a meal. (Patient not taking: Reported on 01/10/2015)     No current facility-administered medications for this visit.   REVIEW OF SYSTEMS: Valu.Nieves ] denotes positive finding; [  ] denotes negative finding  CARDIOVASCULAR:  [ ]  chest pain   [ ]  chest pressure   [ ]  palpitations   [ ]  orthopnea   [ ]  dyspnea on exertion   Valu.Nieves ] claudication   Valu.Nieves ] rest pain   [ ]  DVT   [ ]  phlebitis PULMONARY:   [ ]  productive cough   [ ]  asthma   [ ]  wheezing NEUROLOGIC:   Valu.Nieves ] weakness  Valu.Nieves ] paresthesias  [ ]  aphasia  [ ]  amaurosis  [ ]  dizziness HEMATOLOGIC:   [ ]  bleeding problems   [ ]  clotting disorders MUSCULOSKELETAL:  [  ] joint pain   [ ]  joint swelling [ ]  leg swelling GASTROINTESTINAL: [ ]   blood in stool  [ ]   hematemesis GENITOURINARY:  [ ]   dysuria  [ ]   hematuria PSYCHIATRIC:  [ ]  history of major depression INTEGUMENTARY:  [ ]  rashes  Valu.Nieves ] ulcers CONSTITUTIONAL:  [ ]  fever   [ ]  chills  PHYSICAL EXAM: Filed Vitals:   01/10/15 1039 01/10/15 1055  BP: 166/98 161/102  Pulse: 70 72  Height: 5\' 5"  (1.651 m)   Weight: 118 lb 3.2 oz (53.615 kg)   SpO2: 98%    GENERAL: The patient is a well-nourished male, in no acute distress. The vital signs are documented above. CARDIOVASCULAR: There is a regular rate and rhythm. He has a right carotid bruit. I cannot palpate femoral, popliteal, or pedal pulses. PULMONARY: There is good air exchange bilaterally without wheezing or rales. ABDOMEN: Soft and non-tender with normal pitched bowel sounds. He has a large ventral hernia. He has a suprapubic catheter. MUSCULOSKELETAL: There are no major deformities or cyanosis. NEUROLOGIC: No focal weakness or paresthesias are detected. SKIN: The wound on his right lateral malleolus has essentially healed. The wound on the dorsum of his foot medially has also healed. PSYCHIATRIC: The patient has a normal affect.  DATA:  ABIs performed on 12/11/2014 showed an ABI on the right of 56% and an ABI on the left of 64%. Doppler signals in the feet were monophasic. Toe waveforms were relatively flat.  He did show me a picture from an x-ray that was taken 20 years ago that shows that he has an aortic occlusion.  MEDICAL ISSUES: ATHEROSCLEROSIS WITH ULCERATION: the wounds on his right foot have essentially healed at this point. He has a known aortic occlusion. I've encouraged him to stay as active as possible although his activity is fairly limited because of his disability. He is not a smoker. He knows to take good care of his skin. If he developed a recurrent ulceration and was at risk for limb loss, his only option for  revascularization would be a right axillofemoral bypass graft. Given the suprapubic catheter and large ventral hernia he would not be a candidate for axillobifemoral bypass. If he had issues on the left leg then he would need a left axillofemoral bypass. I've ordered follow up ABIs in 6 months and I will see him back at that time. He call sooner if he has problems. We will also arrange  for a carotid duplex to workup his right carotid bruit.   [place] [facility] [place] [facility]: [phone]

## 2015-01-15 DIAGNOSIS — R1310 Dysphagia, unspecified: Secondary | ICD-10-CM | POA: Diagnosis not present

## 2015-01-15 DIAGNOSIS — Z87891 Personal history of nicotine dependence: Secondary | ICD-10-CM | POA: Diagnosis not present

## 2015-01-15 DIAGNOSIS — Z435 Encounter for attention to cystostomy: Secondary | ICD-10-CM | POA: Diagnosis not present

## 2015-01-15 DIAGNOSIS — L89510 Pressure ulcer of right ankle, unstageable: Secondary | ICD-10-CM | POA: Diagnosis not present

## 2015-01-15 DIAGNOSIS — R339 Retention of urine, unspecified: Secondary | ICD-10-CM | POA: Diagnosis not present

## 2015-01-15 DIAGNOSIS — I429 Cardiomyopathy, unspecified: Secondary | ICD-10-CM | POA: Diagnosis not present

## 2015-01-15 DIAGNOSIS — I10 Essential (primary) hypertension: Secondary | ICD-10-CM | POA: Diagnosis not present

## 2015-01-15 DIAGNOSIS — Z87440 Personal history of urinary (tract) infections: Secondary | ICD-10-CM | POA: Diagnosis not present

## 2015-01-15 DIAGNOSIS — G35 Multiple sclerosis: Secondary | ICD-10-CM | POA: Diagnosis not present

## 2015-01-17 DIAGNOSIS — I10 Essential (primary) hypertension: Secondary | ICD-10-CM | POA: Diagnosis not present

## 2015-01-17 DIAGNOSIS — R1310 Dysphagia, unspecified: Secondary | ICD-10-CM | POA: Diagnosis not present

## 2015-01-17 DIAGNOSIS — G35 Multiple sclerosis: Secondary | ICD-10-CM | POA: Diagnosis not present

## 2015-01-17 DIAGNOSIS — R339 Retention of urine, unspecified: Secondary | ICD-10-CM | POA: Diagnosis not present

## 2015-01-17 DIAGNOSIS — L89510 Pressure ulcer of right ankle, unstageable: Secondary | ICD-10-CM | POA: Diagnosis not present

## 2015-01-17 DIAGNOSIS — I429 Cardiomyopathy, unspecified: Secondary | ICD-10-CM | POA: Diagnosis not present

## 2015-01-18 DIAGNOSIS — R1310 Dysphagia, unspecified: Secondary | ICD-10-CM | POA: Diagnosis not present

## 2015-01-18 DIAGNOSIS — I10 Essential (primary) hypertension: Secondary | ICD-10-CM | POA: Diagnosis not present

## 2015-01-18 DIAGNOSIS — I429 Cardiomyopathy, unspecified: Secondary | ICD-10-CM | POA: Diagnosis not present

## 2015-01-18 DIAGNOSIS — G35 Multiple sclerosis: Secondary | ICD-10-CM | POA: Diagnosis not present

## 2015-01-18 DIAGNOSIS — L89510 Pressure ulcer of right ankle, unstageable: Secondary | ICD-10-CM | POA: Diagnosis not present

## 2015-01-18 DIAGNOSIS — R339 Retention of urine, unspecified: Secondary | ICD-10-CM | POA: Diagnosis not present

## 2015-01-24 DIAGNOSIS — L97319 Non-pressure chronic ulcer of right ankle with unspecified severity: Secondary | ICD-10-CM | POA: Diagnosis not present

## 2015-01-24 DIAGNOSIS — I70233 Atherosclerosis of native arteries of right leg with ulceration of ankle: Secondary | ICD-10-CM | POA: Diagnosis not present

## 2015-01-24 DIAGNOSIS — L97312 Non-pressure chronic ulcer of right ankle with fat layer exposed: Secondary | ICD-10-CM | POA: Diagnosis not present

## 2015-01-25 DIAGNOSIS — R1310 Dysphagia, unspecified: Secondary | ICD-10-CM | POA: Diagnosis not present

## 2015-01-25 DIAGNOSIS — G35 Multiple sclerosis: Secondary | ICD-10-CM | POA: Diagnosis not present

## 2015-01-25 DIAGNOSIS — I429 Cardiomyopathy, unspecified: Secondary | ICD-10-CM | POA: Diagnosis not present

## 2015-01-25 DIAGNOSIS — R339 Retention of urine, unspecified: Secondary | ICD-10-CM | POA: Diagnosis not present

## 2015-01-25 DIAGNOSIS — I10 Essential (primary) hypertension: Secondary | ICD-10-CM | POA: Diagnosis not present

## 2015-01-25 DIAGNOSIS — L89510 Pressure ulcer of right ankle, unstageable: Secondary | ICD-10-CM | POA: Diagnosis not present

## 2015-01-31 DIAGNOSIS — I429 Cardiomyopathy, unspecified: Secondary | ICD-10-CM | POA: Diagnosis not present

## 2015-01-31 DIAGNOSIS — G35 Multiple sclerosis: Secondary | ICD-10-CM | POA: Diagnosis not present

## 2015-01-31 DIAGNOSIS — I10 Essential (primary) hypertension: Secondary | ICD-10-CM | POA: Diagnosis not present

## 2015-01-31 DIAGNOSIS — R339 Retention of urine, unspecified: Secondary | ICD-10-CM | POA: Diagnosis not present

## 2015-01-31 DIAGNOSIS — R1310 Dysphagia, unspecified: Secondary | ICD-10-CM | POA: Diagnosis not present

## 2015-01-31 DIAGNOSIS — L89510 Pressure ulcer of right ankle, unstageable: Secondary | ICD-10-CM | POA: Diagnosis not present

## 2015-02-06 ENCOUNTER — Other Ambulatory Visit: Payer: Self-pay | Admitting: Adult Health

## 2015-02-06 NOTE — Telephone Encounter (Signed)
Rx has been faxed.

## 2015-02-07 DIAGNOSIS — G35 Multiple sclerosis: Secondary | ICD-10-CM | POA: Diagnosis not present

## 2015-02-07 DIAGNOSIS — I429 Cardiomyopathy, unspecified: Secondary | ICD-10-CM | POA: Diagnosis not present

## 2015-02-07 DIAGNOSIS — I10 Essential (primary) hypertension: Secondary | ICD-10-CM | POA: Diagnosis not present

## 2015-02-07 DIAGNOSIS — R339 Retention of urine, unspecified: Secondary | ICD-10-CM | POA: Diagnosis not present

## 2015-02-07 DIAGNOSIS — L89510 Pressure ulcer of right ankle, unstageable: Secondary | ICD-10-CM | POA: Diagnosis not present

## 2015-02-07 DIAGNOSIS — R1310 Dysphagia, unspecified: Secondary | ICD-10-CM | POA: Diagnosis not present

## 2015-02-13 ENCOUNTER — Ambulatory Visit (INDEPENDENT_AMBULATORY_CARE_PROVIDER_SITE_OTHER): Payer: Medicare Other | Admitting: Urology

## 2015-02-13 DIAGNOSIS — R3914 Feeling of incomplete bladder emptying: Secondary | ICD-10-CM

## 2015-02-14 DIAGNOSIS — R339 Retention of urine, unspecified: Secondary | ICD-10-CM | POA: Diagnosis not present

## 2015-02-14 DIAGNOSIS — I10 Essential (primary) hypertension: Secondary | ICD-10-CM | POA: Diagnosis not present

## 2015-02-14 DIAGNOSIS — I429 Cardiomyopathy, unspecified: Secondary | ICD-10-CM | POA: Diagnosis not present

## 2015-02-14 DIAGNOSIS — R1310 Dysphagia, unspecified: Secondary | ICD-10-CM | POA: Diagnosis not present

## 2015-02-14 DIAGNOSIS — L89510 Pressure ulcer of right ankle, unstageable: Secondary | ICD-10-CM | POA: Diagnosis not present

## 2015-02-14 DIAGNOSIS — G35 Multiple sclerosis: Secondary | ICD-10-CM | POA: Diagnosis not present

## 2015-02-20 DIAGNOSIS — I429 Cardiomyopathy, unspecified: Secondary | ICD-10-CM | POA: Diagnosis not present

## 2015-02-20 DIAGNOSIS — I10 Essential (primary) hypertension: Secondary | ICD-10-CM | POA: Diagnosis not present

## 2015-02-20 DIAGNOSIS — R1310 Dysphagia, unspecified: Secondary | ICD-10-CM | POA: Diagnosis not present

## 2015-02-20 DIAGNOSIS — R339 Retention of urine, unspecified: Secondary | ICD-10-CM | POA: Diagnosis not present

## 2015-02-20 DIAGNOSIS — G35 Multiple sclerosis: Secondary | ICD-10-CM | POA: Diagnosis not present

## 2015-02-20 DIAGNOSIS — L89510 Pressure ulcer of right ankle, unstageable: Secondary | ICD-10-CM | POA: Diagnosis not present

## 2015-02-27 DIAGNOSIS — I429 Cardiomyopathy, unspecified: Secondary | ICD-10-CM | POA: Diagnosis not present

## 2015-02-27 DIAGNOSIS — R1310 Dysphagia, unspecified: Secondary | ICD-10-CM | POA: Diagnosis not present

## 2015-02-27 DIAGNOSIS — L89510 Pressure ulcer of right ankle, unstageable: Secondary | ICD-10-CM | POA: Diagnosis not present

## 2015-02-27 DIAGNOSIS — G35 Multiple sclerosis: Secondary | ICD-10-CM | POA: Diagnosis not present

## 2015-02-27 DIAGNOSIS — R339 Retention of urine, unspecified: Secondary | ICD-10-CM | POA: Diagnosis not present

## 2015-02-27 DIAGNOSIS — I10 Essential (primary) hypertension: Secondary | ICD-10-CM | POA: Diagnosis not present

## 2015-03-07 DIAGNOSIS — I429 Cardiomyopathy, unspecified: Secondary | ICD-10-CM | POA: Diagnosis not present

## 2015-03-07 DIAGNOSIS — I10 Essential (primary) hypertension: Secondary | ICD-10-CM | POA: Diagnosis not present

## 2015-03-07 DIAGNOSIS — L89510 Pressure ulcer of right ankle, unstageable: Secondary | ICD-10-CM | POA: Diagnosis not present

## 2015-03-07 DIAGNOSIS — R339 Retention of urine, unspecified: Secondary | ICD-10-CM | POA: Diagnosis not present

## 2015-03-07 DIAGNOSIS — R1310 Dysphagia, unspecified: Secondary | ICD-10-CM | POA: Diagnosis not present

## 2015-03-07 DIAGNOSIS — G35 Multiple sclerosis: Secondary | ICD-10-CM | POA: Diagnosis not present

## 2015-03-13 DIAGNOSIS — R1310 Dysphagia, unspecified: Secondary | ICD-10-CM | POA: Diagnosis not present

## 2015-03-13 DIAGNOSIS — G35 Multiple sclerosis: Secondary | ICD-10-CM | POA: Diagnosis not present

## 2015-03-13 DIAGNOSIS — I10 Essential (primary) hypertension: Secondary | ICD-10-CM | POA: Diagnosis not present

## 2015-03-13 DIAGNOSIS — R339 Retention of urine, unspecified: Secondary | ICD-10-CM | POA: Diagnosis not present

## 2015-03-13 DIAGNOSIS — L89510 Pressure ulcer of right ankle, unstageable: Secondary | ICD-10-CM | POA: Diagnosis not present

## 2015-03-13 DIAGNOSIS — I429 Cardiomyopathy, unspecified: Secondary | ICD-10-CM | POA: Diagnosis not present

## 2015-03-20 ENCOUNTER — Ambulatory Visit (INDEPENDENT_AMBULATORY_CARE_PROVIDER_SITE_OTHER): Payer: Medicare Other | Admitting: Urology

## 2015-03-20 DIAGNOSIS — R339 Retention of urine, unspecified: Secondary | ICD-10-CM

## 2015-04-10 ENCOUNTER — Ambulatory Visit (INDEPENDENT_AMBULATORY_CARE_PROVIDER_SITE_OTHER): Payer: Medicare Other | Admitting: Urology

## 2015-04-10 DIAGNOSIS — R3914 Feeling of incomplete bladder emptying: Secondary | ICD-10-CM

## 2015-05-15 ENCOUNTER — Ambulatory Visit (INDEPENDENT_AMBULATORY_CARE_PROVIDER_SITE_OTHER): Payer: Medicare Other | Admitting: Urology

## 2015-05-15 DIAGNOSIS — R3914 Feeling of incomplete bladder emptying: Secondary | ICD-10-CM | POA: Diagnosis not present

## 2015-05-31 ENCOUNTER — Other Ambulatory Visit: Payer: Self-pay | Admitting: Neurology

## 2015-05-31 NOTE — Telephone Encounter (Signed)
Rx signed and faxed.

## 2015-06-12 ENCOUNTER — Ambulatory Visit (INDEPENDENT_AMBULATORY_CARE_PROVIDER_SITE_OTHER): Payer: Medicare Other | Admitting: Urology

## 2015-06-12 DIAGNOSIS — R3914 Feeling of incomplete bladder emptying: Secondary | ICD-10-CM | POA: Diagnosis not present

## 2015-06-15 ENCOUNTER — Ambulatory Visit: Payer: Medicare Other | Admitting: Neurology

## 2015-06-21 ENCOUNTER — Telehealth: Payer: Self-pay | Admitting: Neurology

## 2015-06-21 NOTE — Telephone Encounter (Signed)
Spouse cancelled appt for 06/22/15 was advised at the time of cancellation that next availability was January 2017. Appointment was rescheduled for next available in February. Spouse wants to know if Dr. Jannifer Franklin will be able to continue to fill diazepam (VALIUM) 5 MG tablet in the meantime.

## 2015-06-22 ENCOUNTER — Ambulatory Visit: Payer: Self-pay | Admitting: Neurology

## 2015-06-25 NOTE — Telephone Encounter (Addendum)
Pt's wife called and is wondering if the work in appt can be moved to any day after 10/15. Their Granddaughter is getting married that week. Please call and advise [phone]

## 2015-06-25 NOTE — Telephone Encounter (Signed)
I called the patient's wife. Appointment scheduled 10/10 at 12 PM.

## 2015-06-26 NOTE — Telephone Encounter (Signed)
I called the patient's wife and r/s appointment to 10/19.

## 2015-07-04 ENCOUNTER — Other Ambulatory Visit: Payer: Self-pay | Admitting: Neurology

## 2015-07-04 MED ORDER — DIAZEPAM 5 MG PO TABS: 5.0000 mg | ORAL_TABLET | Freq: Two times a day (BID) | ORAL | Status: DC

## 2015-07-04 NOTE — Telephone Encounter (Signed)
Patient has rescheduled appt for 10/19

## 2015-07-04 NOTE — Telephone Encounter (Signed)
Pt's wife called stating needing refill for diazepam (VALIUM) 5 MG tablet .

## 2015-07-09 ENCOUNTER — Ambulatory Visit: Payer: Self-pay | Admitting: Neurology

## 2015-07-10 ENCOUNTER — Ambulatory Visit (INDEPENDENT_AMBULATORY_CARE_PROVIDER_SITE_OTHER): Payer: Medicare Other | Admitting: Urology

## 2015-07-10 DIAGNOSIS — R339 Retention of urine, unspecified: Secondary | ICD-10-CM | POA: Diagnosis not present

## 2015-07-18 ENCOUNTER — Encounter: Payer: Self-pay | Admitting: Neurology

## 2015-07-18 ENCOUNTER — Ambulatory Visit (INDEPENDENT_AMBULATORY_CARE_PROVIDER_SITE_OTHER): Payer: Medicare Other | Admitting: Neurology

## 2015-07-18 VITALS — BP 195/83 | HR 54 | Ht 66.0 in | Wt 122.0 lb

## 2015-07-18 DIAGNOSIS — G441 Vascular headache, not elsewhere classified: Secondary | ICD-10-CM

## 2015-07-18 DIAGNOSIS — I70299 Other atherosclerosis of native arteries of extremities, unspecified extremity: Secondary | ICD-10-CM | POA: Diagnosis not present

## 2015-07-18 DIAGNOSIS — R269 Unspecified abnormalities of gait and mobility: Secondary | ICD-10-CM | POA: Diagnosis not present

## 2015-07-18 DIAGNOSIS — L97909 Non-pressure chronic ulcer of unspecified part of unspecified lower leg with unspecified severity: Secondary | ICD-10-CM

## 2015-07-18 DIAGNOSIS — R1310 Dysphagia, unspecified: Secondary | ICD-10-CM

## 2015-07-18 MED ORDER — DIAZEPAM 5 MG PO TABS: 5.0000 mg | ORAL_TABLET | Freq: Two times a day (BID) | ORAL | Status: DC

## 2015-07-18 NOTE — Patient Instructions (Signed)
Fall Prevention in the Home  Falls can cause injuries and can affect people from all age groups. There are many simple things that you can do to make your home safe and to help prevent falls. WHAT CAN I DO ON THE OUTSIDE OF MY HOME?  Regularly repair the edges of walkways and driveways and fix any cracks.  Remove high doorway thresholds.  Trim any shrubbery on the main path into your home.  Use bright outdoor lighting.  Clear walkways of debris and clutter, including tools and rocks.  Regularly check that handrails are securely fastened and in good repair. Both sides of any steps should have handrails.  Install guardrails along the edges of any raised decks or porches.  Have leaves, snow, and ice cleared regularly.  Use sand or salt on walkways during winter months.  In the garage, clean up any spills right away, including grease or oil spills. WHAT CAN I DO IN THE BATHROOM?  Use night lights.  Install grab bars by the toilet and in the tub and shower. Do not use towel bars as grab bars.  Use non-skid mats or decals on the floor of the tub or shower.  If you need to sit down while you are in the shower, use a plastic, non-slip stool..  Keep the floor dry. Immediately clean up any water that spills on the floor.  Remove soap buildup in the tub or shower on a regular basis.  Attach bath mats securely with double-sided non-slip rug tape.  Remove throw rugs and other tripping hazards from the floor. WHAT CAN I DO IN THE BEDROOM?  Use night lights.  Make sure that a bedside light is easy to reach.  Do not use oversized bedding that drapes onto the floor.  Have a firm chair that has side arms to use for getting dressed.  Remove throw rugs and other tripping hazards from the floor. WHAT CAN I DO IN THE KITCHEN?   Clean up any spills right away.  Avoid walking on wet floors.  Place frequently used items in easy-to-reach places.  If you need to reach for something  above you, use a sturdy step stool that has a grab bar.  Keep electrical cables out of the way.  Do not use floor polish or wax that makes floors slippery. If you have to use wax, make sure that it is non-skid floor wax.  Remove throw rugs and other tripping hazards from the floor. WHAT CAN I DO IN THE STAIRWAYS?  Do not leave any items on the stairs.  Make sure that there are handrails on both sides of the stairs. Fix handrails that are broken or loose. Make sure that handrails are as long as the stairways.  Check any carpeting to make sure that it is firmly attached to the stairs. Fix any carpet that is loose or worn.  Avoid having throw rugs at the top or bottom of stairways, or secure the rugs with carpet tape to prevent them from moving.  Make sure that you have a light switch at the top of the stairs and the bottom of the stairs. If you do not have them, have them installed. WHAT ARE SOME OTHER FALL PREVENTION TIPS?  Wear closed-toe shoes that fit well and support your feet. Wear shoes that have rubber soles or low heels.  When you use a stepladder, make sure that it is completely opened and that the sides are firmly locked. Have someone hold the ladder while you   are using it. Do not climb a closed stepladder.  Add color or contrast paint or tape to grab bars and handrails in your home. Place contrasting color strips on the first and last steps.  Use mobility aids as needed, such as canes, walkers, scooters, and crutches.  Turn on lights if it is dark. Replace any light bulbs that burn out.  Set up furniture so that there are clear paths. Keep the furniture in the same spot.  Fix any uneven floor surfaces.  Choose a carpet design that does not hide the edge of steps of a stairway.  Be aware of any and all pets.  Review your medicines with your healthcare provider. Some medicines can cause dizziness or changes in blood pressure, which increase your risk of falling. Talk  with your health care provider about other ways that you can decrease your risk of falls. This may include working with a physical therapist or trainer to improve your strength, balance, and endurance.   This information is not intended to replace advice given to you by your health care provider. Make sure you discuss any questions you have with your health care provider.   Document Released: 09/05/2002 Document Revised: 01/30/2015 Document Reviewed: 10/20/2014 Elsevier Interactive Patient Education 2016 [employer]  

## 2015-07-18 NOTE — Progress Notes (Signed)
Reason for visit: Gait disorder  Jordan Ward is an 74 y.o. male  History of present illness:  Jordan Ward is a 74 year old right-handed white male with a history of cerebrovascular disease, some question of multiple sclerosis in the past. The patient has extensive white matter changes on MRI of the brain, and he has a chronic gait disorder associated with this. The patient also has some problems with dysphagia, and a neurogenic bladder. The patient has an indwelling catheter, and he has intermittent problems with urinary tract infections. He has been seen by speech therapy previously for dysphagia, he is on thickened liquids, and he is doing well with this. The patient has to crush his medications in order to take them. The patient uses a walker for ambulation, he is doing fairly well with this, he has not had any falls since last year. The wife indicates that his ability to ambulate has actually been better recently than it has been in several years. He does have headaches off and on, he may have 2 or 3 headaches a month, he is able to get improvement in the headaches quickly. He returns to the office today for an evaluation.  Past Medical History  Diagnosis Date  . Arthritis   . MS (multiple sclerosis) ([place])   . Insomnia   . PVD (peripheral vascular disease) ([place])   . Carotid artery stenosis   . TIA (transient ischemic attack)   . Chronic indwelling Foley catheter 10/06/2011  . Pulmonary fibrosis ([place]) 10/06/2011  . Dysphagia 10/07/2011  . Tremors of nervous system 10/08/2011  . Hypothyroidism 10/08/2011  . Pulmonary nodule 10/08/2011  . C. difficile colitis 09/2011  . Pneumonia   . Encephalopathy   . Urinary tract infection   . Tremors of nervous system   . Hypokalemia   . Junctional rhythm   . Anemia   . Hypernatremia   . Sacral ulcer   . HTN (hypertension), malignant 10/06/2011  . Sleep apnea   . Neuromuscular disorder ([place])   . Back pain, chronic   . Kidney stones   . High grade  dysplasia in colonic adenoma 09/2005  . Gait disorder   . OSA (obstructive sleep apnea)   . Paroxysmal atrial tachycardia ([place])   . Dyslipidemia   . Cerebrovascular disease   . CAD (coronary artery disease)   . Peripheral vascular disease ([place])   . Bilateral carotid bruits   . Colon polyps   . HA (headache)     Past Surgical History  Procedure Laterality Date  . Inguinal hernia repair  1971    bilateral  . Back surgery  1976/1979  . Colonoscopy  11/2004    Dr. Sharol Roussel sessile polyp splenic flexure, 78mm sessile polyp desc colon, tubulovillous adenoma (bx not removed)  . Colonoscopy  01/2005    poor prep, polyp could not be found  . Colonoscopy  05/2005    with EMR, polypectomy Dr. Olegario Messier, bx showed high grade dysplasia, partially resected  . Colonoscopy  09/2005    Dr. Arsenio Loader, [place] ink tattooing, four villous colon polyp (3 had been missed on previous colonoscopies due to limitations of procedures  . Colon surgery  09/2005    Fleishman: four tubular adenomas, large adenomatous polyp with HIGH GRADE dysplasia  . Appendectomy  09/2005    at time of left hemicolectomy  . Colonoscopy  09/2006    normal TI, no polyps  . Colonoscopy  10/2007    Dr. Imogene Burn distal mammillations, benign  bx, normal TI, random bx neg for microscopic colitis  . Cholecystectomy      Dr. Tamala Julian  . Suprapubic catheter insertion      Family History  Problem Relation Age of Onset  . Colon cancer Neg Hx   . Cirrhosis Brother     etoh  . Stroke Mother 76  . Coronary artery disease Father 72  . Heart attack Brother   . Multiple sclerosis    . Cancer Sister     Social history:  reports that he has quit smoking. His smoking use included Cigarettes. He has never used smokeless tobacco. He reports that he does not drink alcohol or use illicit drugs.    Allergies  Allergen Reactions  . Tetracyclines & Related Anaphylaxis and Rash  . Ciprofloxacin     Trouble swallowing    Medications:   Prior to Admission medications   Medication Sig Start Date End Date Taking? Authorizing Provider  aspirin 81 MG chewable tablet Chew 1 tablet (81 mg total) by mouth daily. 07/26/14  Yes Verlee Monte, MD  carvedilol (COREG) 6.25 MG tablet Take 1 tablet (6.25 mg total) by mouth 2 (two) times daily with a meal. 07/26/14  Yes Verlee Monte, MD  diazepam (VALIUM) 5 MG tablet Take 1 tablet (5 mg total) by mouth 2 (two) times daily. 07/04/15  Yes Kathrynn Ducking, MD  HYDROcodone-acetaminophen ([place]) 7.5-325 MG per tablet Take 1 tablet by mouth 2 (two) times daily. Max APAP 3 GM IN 24 HOURS FROM ALL SOURCES 08/22/14  Yes Mahima Pandey, MD  levothyroxine (SYNTHROID, LEVOTHROID) 88 MCG tablet Take 88 mcg by mouth daily before breakfast.   Yes Historical Provider, MD  potassium chloride SA (K-DUR,KLOR-CON) 20 MEQ tablet Take 20 mEq by mouth daily.   Yes Rexene Alberts, MD  verapamil (CALAN) 40 MG tablet Take 40 mg by mouth 2 (two) times daily.   Yes Historical Provider, MD    ROS:  Out of a complete 14 system review of symptoms, the patient complains only of the following symptoms, and all other reviewed systems are negative.  Difficulty swallowing, drooling Double vision, blurred vision Choking Constipation Sleep apnea Walking difficulties Dizziness, headache, tremors  Blood pressure 195/83, pulse 54, height 5\' 6"  (1.676 m), weight 122 lb (55.339 kg).  Physical Exam  General: The patient is alert and cooperative at the time of the examination.  Skin: No significant peripheral edema is noted.   Neurologic Exam  Mental status: The patient is alert and oriented x 3 at the time of the examination. The patient has apparent normal recent and remote memory, with an apparently normal attention span and concentration ability.   Cranial nerves: Facial symmetry is present. Speech is normal, no aphasia or dysarthria is noted. Extraocular movements are full. Visual fields are full.  Motor: The  patient has good strength in all 4 extremities.  Sensory examination: Soft touch sensation is symmetric on the face, arms, and legs.  Coordination: The patient has good finger-nose-finger and heel-to-shin bilaterally.  Gait and station: The patient has a circumduction type gait with a left leg. Tandem gait was not attempted. Romberg is negative. No drift is seen.  Reflexes: Deep tendon reflexes are symmetric in the arms, but the patient has some elevation the left knee jerk reflex as compared to the right.   MRI brain 10/03/2011:  IMPRESSION: Left cerebellar linear area of altered signal intensity (series 3 image 5), question artifact versus small acute infarct  Remote corona radiata/basal ganglia infarcts.  Prominent white matter type changes consistent with the patient's history multiple sclerosis have changed minimally from prior exam.  Global atrophy without hydrocephalus.  Questionable abnormality posterior to the C3 vertebral body causing cord flattening (series 2 image 9).  * MRI scan images were reviewed online. I agree with the written report.    Assessment/Plan:  1. Cerebrovascular disease, abnormal MRI brain  2. Gait disorder  3. Headache  4. Dysphagia  The patient overall is doing fairly well with his ability to function and ambulate. The patient will be maintained on diazepam,a  prescription was written for this. If the patient continues to do well, he will follow-up in one year, but he may return sooner if needed.  Jill Alexanders MD 07/18/2015 12:08 PM  [facility] [address] [place], [place] [address]  Phone [phone] Fax [phone]

## 2015-07-25 ENCOUNTER — Ambulatory Visit: Payer: Medicare Other | Admitting: Vascular Surgery

## 2015-07-25 ENCOUNTER — Encounter (HOSPITAL_COMMUNITY): Payer: Medicare Other

## 2015-08-02 DIAGNOSIS — N390 Urinary tract infection, site not specified: Secondary | ICD-10-CM | POA: Diagnosis not present

## 2015-08-02 DIAGNOSIS — G35 Multiple sclerosis: Secondary | ICD-10-CM | POA: Diagnosis not present

## 2015-08-02 DIAGNOSIS — J8410 Pulmonary fibrosis, unspecified: Secondary | ICD-10-CM | POA: Diagnosis not present

## 2015-08-03 ENCOUNTER — Encounter (HOSPITAL_COMMUNITY): Payer: Self-pay | Admitting: *Deleted

## 2015-08-03 ENCOUNTER — Inpatient Hospital Stay (HOSPITAL_COMMUNITY): Payer: Medicare Other

## 2015-08-03 ENCOUNTER — Inpatient Hospital Stay (HOSPITAL_COMMUNITY)
Admission: AD | Admit: 2015-08-03 | Discharge: 2015-08-09 | DRG: 683 | Disposition: A | Discharge status: Home Health | Payer: Medicare Other | Source: Ambulatory Visit | Attending: Internal Medicine | Admitting: Internal Medicine

## 2015-08-03 DIAGNOSIS — E039 Hypothyroidism, unspecified: Secondary | ICD-10-CM | POA: Diagnosis present

## 2015-08-03 DIAGNOSIS — Z881 Allergy status to other antibiotic agents status: Secondary | ICD-10-CM

## 2015-08-03 DIAGNOSIS — J8410 Pulmonary fibrosis, unspecified: Secondary | ICD-10-CM | POA: Diagnosis present

## 2015-08-03 DIAGNOSIS — N1330 Unspecified hydronephrosis: Secondary | ICD-10-CM

## 2015-08-03 DIAGNOSIS — I120 Hypertensive chronic kidney disease with stage 5 chronic kidney disease or end stage renal disease: Secondary | ICD-10-CM | POA: Diagnosis not present

## 2015-08-03 DIAGNOSIS — G35 Multiple sclerosis: Secondary | ICD-10-CM | POA: Diagnosis present

## 2015-08-03 DIAGNOSIS — N390 Urinary tract infection, site not specified: Secondary | ICD-10-CM | POA: Diagnosis present

## 2015-08-03 DIAGNOSIS — Z7982 Long term (current) use of aspirin: Secondary | ICD-10-CM | POA: Diagnosis not present

## 2015-08-03 DIAGNOSIS — Z8673 Personal history of transient ischemic attack (TIA), and cerebral infarction without residual deficits: Secondary | ICD-10-CM | POA: Diagnosis not present

## 2015-08-03 DIAGNOSIS — N183 Chronic kidney disease, stage 3 (moderate): Secondary | ICD-10-CM | POA: Diagnosis present

## 2015-08-03 DIAGNOSIS — N201 Calculus of ureter: Secondary | ICD-10-CM | POA: Diagnosis not present

## 2015-08-03 DIAGNOSIS — N179 Acute kidney failure, unspecified: Secondary | ICD-10-CM | POA: Diagnosis not present

## 2015-08-03 DIAGNOSIS — G8929 Other chronic pain: Secondary | ICD-10-CM | POA: Diagnosis present

## 2015-08-03 DIAGNOSIS — I498 Other specified cardiac arrhythmias: Secondary | ICD-10-CM | POA: Diagnosis present

## 2015-08-03 DIAGNOSIS — M899 Disorder of bone, unspecified: Secondary | ICD-10-CM | POA: Diagnosis present

## 2015-08-03 DIAGNOSIS — Z992 Dependence on renal dialysis: Secondary | ICD-10-CM | POA: Diagnosis not present

## 2015-08-03 DIAGNOSIS — Z791 Long term (current) use of non-steroidal anti-inflammatories (NSAID): Secondary | ICD-10-CM | POA: Diagnosis not present

## 2015-08-03 DIAGNOSIS — N132 Hydronephrosis with renal and ureteral calculous obstruction: Secondary | ICD-10-CM | POA: Diagnosis not present

## 2015-08-03 DIAGNOSIS — R509 Fever, unspecified: Secondary | ICD-10-CM

## 2015-08-03 DIAGNOSIS — Z87891 Personal history of nicotine dependence: Secondary | ICD-10-CM

## 2015-08-03 DIAGNOSIS — M1990 Unspecified osteoarthritis, unspecified site: Secondary | ICD-10-CM | POA: Diagnosis present

## 2015-08-03 DIAGNOSIS — L89151 Pressure ulcer of sacral region, stage 1: Secondary | ICD-10-CM | POA: Diagnosis present

## 2015-08-03 DIAGNOSIS — G4733 Obstructive sleep apnea (adult) (pediatric): Secondary | ICD-10-CM | POA: Diagnosis present

## 2015-08-03 DIAGNOSIS — I10 Essential (primary) hypertension: Secondary | ICD-10-CM | POA: Diagnosis not present

## 2015-08-03 DIAGNOSIS — I679 Cerebrovascular disease, unspecified: Secondary | ICD-10-CM | POA: Diagnosis present

## 2015-08-03 DIAGNOSIS — Z79899 Other long term (current) drug therapy: Secondary | ICD-10-CM

## 2015-08-03 DIAGNOSIS — E875 Hyperkalemia: Secondary | ICD-10-CM | POA: Diagnosis not present

## 2015-08-03 DIAGNOSIS — I5032 Chronic diastolic (congestive) heart failure: Secondary | ICD-10-CM | POA: Diagnosis present

## 2015-08-03 DIAGNOSIS — N3020 Other chronic cystitis without hematuria: Secondary | ICD-10-CM | POA: Diagnosis not present

## 2015-08-03 DIAGNOSIS — N319 Neuromuscular dysfunction of bladder, unspecified: Secondary | ICD-10-CM | POA: Diagnosis present

## 2015-08-03 DIAGNOSIS — I130 Hypertensive heart and chronic kidney disease with heart failure and stage 1 through stage 4 chronic kidney disease, or unspecified chronic kidney disease: Secondary | ICD-10-CM | POA: Diagnosis present

## 2015-08-03 DIAGNOSIS — I9581 Postprocedural hypotension: Secondary | ICD-10-CM | POA: Diagnosis not present

## 2015-08-03 DIAGNOSIS — I2510 Atherosclerotic heart disease of native coronary artery without angina pectoris: Secondary | ICD-10-CM | POA: Diagnosis present

## 2015-08-03 DIAGNOSIS — I420 Dilated cardiomyopathy: Secondary | ICD-10-CM | POA: Diagnosis not present

## 2015-08-03 DIAGNOSIS — I5022 Chronic systolic (congestive) heart failure: Secondary | ICD-10-CM | POA: Diagnosis not present

## 2015-08-03 DIAGNOSIS — E785 Hyperlipidemia, unspecified: Secondary | ICD-10-CM | POA: Diagnosis present

## 2015-08-03 DIAGNOSIS — I739 Peripheral vascular disease, unspecified: Secondary | ICD-10-CM | POA: Diagnosis present

## 2015-08-03 DIAGNOSIS — Z9289 Personal history of other medical treatment: Secondary | ICD-10-CM | POA: Diagnosis not present

## 2015-08-03 DIAGNOSIS — N170 Acute kidney failure with tubular necrosis: Secondary | ICD-10-CM

## 2015-08-03 DIAGNOSIS — D649 Anemia, unspecified: Secondary | ICD-10-CM | POA: Diagnosis not present

## 2015-08-03 DIAGNOSIS — Z452 Encounter for adjustment and management of vascular access device: Secondary | ICD-10-CM

## 2015-08-03 LAB — COMPREHENSIVE METABOLIC PANEL
ALBUMIN: 3.9 g/dL (ref 3.5–5.0)
ALT: 15 U/L — ABNORMAL LOW (ref 17–63)
ANION GAP: 11 (ref 5–15)
AST: 22 U/L (ref 15–41)
Alkaline Phosphatase: 112 U/L (ref 38–126)
BUN: 45 mg/dL — ABNORMAL HIGH (ref 6–20)
CHLORIDE: 102 mmol/L (ref 101–111)
CO2: 24 mmol/L (ref 22–32)
Calcium: 9.4 mg/dL (ref 8.9–10.3)
Creatinine, Ser: 4.44 mg/dL — ABNORMAL HIGH (ref 0.61–1.24)
GFR calc Af Amer: 14 mL/min — ABNORMAL LOW (ref >60)
GFR calc non Af Amer: 12 mL/min — ABNORMAL LOW (ref >60)
GLUCOSE: 123 mg/dL — AB (ref 65–99)
POTASSIUM: 5.5 mmol/L — AB (ref 3.5–5.1)
SODIUM: 137 mmol/L (ref 135–145)
TOTAL PROTEIN: 7.7 g/dL (ref 6.5–8.1)
Total Bilirubin: 1.1 mg/dL (ref 0.3–1.2)

## 2015-08-03 LAB — CBC WITH DIFFERENTIAL/PLATELET
BASOS ABS: 0 10*3/uL (ref 0.0–0.1)
Basophils Relative: 0 %
Eosinophils Absolute: 0.1 10*3/uL (ref 0.0–0.7)
Eosinophils Relative: 0 %
HEMATOCRIT: 43.1 % (ref 39.0–52.0)
Hemoglobin: 14.6 g/dL (ref 13.0–17.0)
LYMPHS ABS: 1.8 10*3/uL (ref 0.7–4.0)
LYMPHS PCT: 7 %
MCH: 30.9 pg (ref 26.0–34.0)
MCHC: 33.9 g/dL (ref 30.0–36.0)
MCV: 91.3 fL (ref 78.0–100.0)
MONO ABS: 1.7 10*3/uL — AB (ref 0.1–1.0)
MONOS PCT: 7 %
NEUTROS ABS: 20 10*3/uL — AB (ref 1.7–7.7)
Neutrophils Relative %: 85 %
Platelets: 118 10*3/uL — ABNORMAL LOW (ref 150–400)
RBC: 4.72 MIL/uL (ref 4.22–5.81)
RDW: 13.7 % (ref 11.5–15.5)
WBC: 23.6 10*3/uL — ABNORMAL HIGH (ref 4.0–10.5)

## 2015-08-03 MED ORDER — RESOURCE THICKENUP CLEAR PO POWD
ORAL | Status: DC | PRN
  Administered 2015-08-03: 18:00:00 via ORAL
  Filled 2015-08-03: qty 125

## 2015-08-03 MED ORDER — ACETAMINOPHEN 325 MG PO TABS: 650.0000 mg | ORAL_TABLET | Freq: Four times a day (QID) | ORAL | Status: DC | PRN

## 2015-08-03 MED ORDER — SODIUM CHLORIDE 0.9 % IV SOLN
INTRAVENOUS | Status: DC
  Administered 2015-08-03 – 2015-08-07 (×8): via INTRAVENOUS

## 2015-08-03 MED ORDER — CARVEDILOL 6.25 MG PO TABS
6.2500 mg | ORAL_TABLET | Freq: Two times a day (BID) | ORAL | Status: DC
  Administered 2015-08-03 – 2015-08-09 (×12): 6.25 mg via ORAL
  Filled 2015-08-03 (×2): qty 2
  Filled 2015-08-03 (×7): qty 1
  Filled 2015-08-03: qty 2
  Filled 2015-08-03 (×2): qty 1

## 2015-08-03 MED ORDER — ONDANSETRON HCL 4 MG PO TABS: 4.0000 mg | ORAL_TABLET | Freq: Four times a day (QID) | ORAL | Status: DC | PRN

## 2015-08-03 MED ORDER — OXYCODONE HCL 5 MG PO TABS
5.0000 mg | ORAL_TABLET | ORAL | Status: DC | PRN
  Administered 2015-08-03 – 2015-08-09 (×9): 5 mg via ORAL
  Filled 2015-08-03 (×9): qty 1

## 2015-08-03 MED ORDER — ALUM & MAG HYDROXIDE-SIMETH 200-200-20 MG/5ML PO SUSP: 30.0000 mL | Freq: Four times a day (QID) | ORAL | Status: DC | PRN

## 2015-08-03 MED ORDER — ACETAMINOPHEN 650 MG RE SUPP: 650.0000 mg | Freq: Four times a day (QID) | RECTAL | Status: DC | PRN

## 2015-08-03 MED ORDER — POTASSIUM CHLORIDE CRYS ER 20 MEQ PO TBCR: 20.0000 meq | EXTENDED_RELEASE_TABLET | Freq: Every day | ORAL | Status: DC

## 2015-08-03 MED ORDER — ENOXAPARIN SODIUM 40 MG/0.4ML ~~LOC~~ SOLN
40.0000 mg | SUBCUTANEOUS | Status: DC
  Administered 2015-08-03: 40 mg via SUBCUTANEOUS
  Filled 2015-08-03: qty 0.4

## 2015-08-03 MED ORDER — ENOXAPARIN SODIUM 30 MG/0.3ML ~~LOC~~ SOLN: 30.0000 mg | SUBCUTANEOUS | Status: DC

## 2015-08-03 MED ORDER — LEVOFLOXACIN IN D5W 500 MG/100ML IV SOLN
500.0000 mg | INTRAVENOUS | Status: DC
  Administered 2015-08-03: 500 mg via INTRAVENOUS
  Filled 2015-08-03: qty 100

## 2015-08-03 MED ORDER — VERAPAMIL HCL 40 MG PO TABS
40.0000 mg | ORAL_TABLET | Freq: Two times a day (BID) | ORAL | Status: DC
  Administered 2015-08-03 – 2015-08-05 (×4): 40 mg via ORAL
  Filled 2015-08-03 (×5): qty 1

## 2015-08-03 MED ORDER — LEVOTHYROXINE SODIUM 88 MCG PO TABS
88.0000 ug | ORAL_TABLET | Freq: Every day | ORAL | Status: DC
Start: 2015-08-04 — End: 2015-08-07
  Administered 2015-08-04 – 2015-08-06 (×2): 88 ug via ORAL
  Filled 2015-08-03 (×2): qty 1

## 2015-08-03 MED ORDER — DIAZEPAM 5 MG PO TABS
5.0000 mg | ORAL_TABLET | Freq: Two times a day (BID) | ORAL | Status: DC
  Administered 2015-08-03 – 2015-08-09 (×12): 5 mg via ORAL
  Filled 2015-08-03 (×12): qty 1

## 2015-08-03 MED ORDER — ONDANSETRON HCL 4 MG/2ML IJ SOLN
4.0000 mg | Freq: Four times a day (QID) | INTRAMUSCULAR | Status: DC | PRN
  Administered 2015-08-05: 4 mg via INTRAVENOUS
  Filled 2015-08-03: qty 2

## 2015-08-03 MED ORDER — ASPIRIN 81 MG PO CHEW
81.0000 mg | CHEWABLE_TABLET | Freq: Every day | ORAL | Status: DC
  Administered 2015-08-03 – 2015-08-09 (×7): 81 mg via ORAL
  Filled 2015-08-03 (×7): qty 1

## 2015-08-03 NOTE — Progress Notes (Signed)
Patient has had no urinary output this shift. BUN and creatinine elevated. Dr. Cindie Laroche notified. Nephro consult ordered. Suprapubic catheter flushed with normal saline as ordered by physician. Pale yellow, cloudy return noted. Catheter emptied. Will continue to monitor for output.

## 2015-08-04 ENCOUNTER — Inpatient Hospital Stay (HOSPITAL_COMMUNITY): Payer: Medicare Other

## 2015-08-04 DIAGNOSIS — I5032 Chronic diastolic (congestive) heart failure: Secondary | ICD-10-CM | POA: Diagnosis present

## 2015-08-04 DIAGNOSIS — L8990 Pressure ulcer of unspecified site, unspecified stage: Secondary | ICD-10-CM | POA: Insufficient documentation

## 2015-08-04 DIAGNOSIS — N179 Acute kidney failure, unspecified: Secondary | ICD-10-CM | POA: Insufficient documentation

## 2015-08-04 DIAGNOSIS — N183 Chronic kidney disease, stage 3 (moderate): Secondary | ICD-10-CM

## 2015-08-04 DIAGNOSIS — Z9289 Personal history of other medical treatment: Secondary | ICD-10-CM

## 2015-08-04 DIAGNOSIS — E039 Hypothyroidism, unspecified: Secondary | ICD-10-CM

## 2015-08-04 DIAGNOSIS — G35 Multiple sclerosis: Secondary | ICD-10-CM

## 2015-08-04 DIAGNOSIS — N132 Hydronephrosis with renal and ureteral calculous obstruction: Secondary | ICD-10-CM | POA: Diagnosis present

## 2015-08-04 LAB — BASIC METABOLIC PANEL
Anion gap: 10 (ref 5–15)
BUN: 61 mg/dL — AB (ref 6–20)
CALCIUM: 8.4 mg/dL — AB (ref 8.9–10.3)
CO2: 21 mmol/L — ABNORMAL LOW (ref 22–32)
Chloride: 106 mmol/L (ref 101–111)
Creatinine, Ser: 6.37 mg/dL — ABNORMAL HIGH (ref 0.61–1.24)
GFR calc Af Amer: 9 mL/min — ABNORMAL LOW (ref >60)
GFR, EST NON AFRICAN AMERICAN: 8 mL/min — AB (ref >60)
GLUCOSE: 117 mg/dL — AB (ref 65–99)
Potassium: 5.8 mmol/L — ABNORMAL HIGH (ref 3.5–5.1)
Sodium: 137 mmol/L (ref 135–145)

## 2015-08-04 LAB — CBC
HEMATOCRIT: 37.2 % — AB (ref 39.0–52.0)
Hemoglobin: 12.2 g/dL — ABNORMAL LOW (ref 13.0–17.0)
MCH: 30.3 pg (ref 26.0–34.0)
MCHC: 32.8 g/dL (ref 30.0–36.0)
MCV: 92.5 fL (ref 78.0–100.0)
PLATELETS: 93 10*3/uL — AB (ref 150–400)
RBC: 4.02 MIL/uL — ABNORMAL LOW (ref 4.22–5.81)
RDW: 13.9 % (ref 11.5–15.5)
WBC: 14.4 10*3/uL — ABNORMAL HIGH (ref 4.0–10.5)

## 2015-08-04 LAB — MRSA PCR SCREENING: MRSA by PCR: NEGATIVE

## 2015-08-04 LAB — PROTIME-INR
INR: 1.25 (ref 0.00–1.49)
Prothrombin Time: 15.8 seconds — ABNORMAL HIGH (ref 11.6–15.2)

## 2015-08-04 LAB — CREATININE, URINE, RANDOM: Creatinine, Urine: <10 mg/dL

## 2015-08-04 LAB — SODIUM, URINE, RANDOM: Sodium, Ur: 82 mmol/L

## 2015-08-04 LAB — APTT: aPTT: 36 seconds (ref 24–37)

## 2015-08-04 MED ORDER — LEVOFLOXACIN IN D5W 500 MG/100ML IV SOLN: 500.0000 mg | INTRAVENOUS | Status: DC

## 2015-08-04 MED ORDER — SODIUM POLYSTYRENE SULFONATE 15 GM/60ML PO SUSP
15.0000 g | Freq: Once | ORAL | Status: DC
  Filled 2015-08-04: qty 60

## 2015-08-04 MED ORDER — DEXTROSE 5 % IV SOLN
1.0000 g | Freq: Every day | INTRAVENOUS | Status: DC
  Administered 2015-08-04: 1 g via INTRAVENOUS
  Filled 2015-08-04: qty 10

## 2015-08-04 MED ORDER — SODIUM POLYSTYRENE SULFONATE 15 GM/60ML PO SUSP
15.0000 g | Freq: Once | ORAL | Status: AC
Start: 2015-08-04 — End: 2015-08-04
  Administered 2015-08-04: 15 g via ORAL
  Filled 2015-08-04: qty 60

## 2015-08-04 NOTE — Progress Notes (Addendum)
[facility] History and Physical Progressive note   Jordan Ward [id number] DOB: Jun 23, 1941 DOA: 08/03/2015  Referring physician: ED physician PCP: Jordan Bogus, MD  Specialists:   Brief description of history:   Patient was initially admitted to [facility] by Dr. Luan Ward, and transferred to [facility] SDU per urology  74 year old male with past medical history of hypertension, hypothyroidism, anxiety, MS, PVD, TIA, carotid artery stenosis, chronic dwelling catheter placement, pulmonary fibrosis, C. difficile colitis, CKD-3, OSA, paroxysmal atrial tachycardia, CAD, systolic congestive heart failure with EF of 30-35%, who presents with decreased urine output, left flank pain and abdominal pain. Patient reports that he started having decreased urine output 3 days ago. He also had left flank pain and abdominal pain around suprapubic catheter area. He had nausea and vomited once on Thursday, but no nausea, vomiting or diarrhea currently. Patient does not have chest pain or shortness of breath. He has mild cough which is at his baseline. Two doses of Kayexalate 15 g 2 were given due to hyperkalemia.  Patient was found to have increased creatinine from 1.6 to 6.37, potassium 5.8 with mild T wave peaking only V2, BUN 61, WBC 14.4, INR 1.25, PTT 36, positive urinalysis with small amount of leukocytes, which are normal, no tachycardia, negative chest x-ray. Renal, IR and Urology were consulted.  US-renal showed interval mild to moderate left hydronephrosis, most likely due to nonvisualized ureteral calculus; bilateral nonobstructing renal calculi; interval increased echogenicity of both kidneys, compatible with medical renal disease; Progressive right renal atrophy.  CT-abdomen/pelvis showed: Obstructive 10 mm calculus at the left ureteropelvic junction with with moderate to severe proximal hydronephrosis and perinephric stranding; multiple additional nonobstructive calculi are present within the  collecting systems of the kidneys bilaterally, and in the lumen of the urinary bladder; large partially calcified stone in the distal common bile duct associated with proximal hepatic ductal dilatation. This appears to be chronic, and is similar to remote prior study 07/18/2014; trace volume of ascites; right renal atrophy; small left pleural effusion lying dependently; potential interstitial lung disease; the distal infrarenal abdominal aorta and proximal common iliac arteries are very diminutive in size, which could indicate chronic occlusion or high-grade stenosis; large ventral hernia in the anterior abdominal [place] containing multiple loops of small bowel, without evidence of incarceration or obstruction at this time.  Review of Systems:   General: no fevers, chills, no changes in body weight, has poor appetite, has fatigue HEENT: no blurry vision, hearing changes or sore throat Pulm: no dyspnea, has coughing, no wheezing CV: no chest pain, palpitations Abd: currently no  nausea, vomiting, has abdominal pain, no diarrhea, constipation GU: no dysuria, burning on urination, increased urinary frequency, hematuria  Ext: no leg edema Neuro: no unilateral weakness, numbness, or tingling, no vision change or hearing loss Skin: no rash MSK: No muscle spasm, no deformity, no limitation of range of movement in spin Heme: No easy bruising.  Travel history: No recent long distant travel.  Allergy:  Allergies  Allergen Reactions  . Tetracyclines & Related Anaphylaxis and Rash  . Ciprofloxacin     Trouble swallowing    Past Medical History  Diagnosis Date  . Arthritis   . MS (multiple sclerosis) ([place])   . Insomnia   . PVD (peripheral vascular disease) ([place])   . Carotid artery stenosis   . TIA (transient ischemic attack)   . Chronic indwelling Foley catheter 10/06/2011  . Pulmonary fibrosis ([place]) 10/06/2011  . Dysphagia 10/07/2011  . Tremors of nervous system  10/08/2011  . Hypothyroidism 10/08/2011   . Pulmonary nodule 10/08/2011  . C. difficile colitis 09/2011  . Pneumonia   . Encephalopathy   . Urinary tract infection   . Tremors of nervous system   . Hypokalemia   . Junctional rhythm   . Anemia   . Hypernatremia   . Sacral ulcer   . HTN (hypertension), malignant 10/06/2011  . Sleep apnea   . Neuromuscular disorder ([place])   . Back pain, chronic   . Kidney stones   . High grade dysplasia in colonic adenoma 09/2005  . Gait disorder   . OSA (obstructive sleep apnea)   . Paroxysmal atrial tachycardia ([place])   . Dyslipidemia   . Cerebrovascular disease   . CAD (coronary artery disease)   . Peripheral vascular disease ([place])   . Bilateral carotid bruits   . Colon polyps   . HA (headache)     Past Surgical History  Procedure Laterality Date  . Inguinal hernia repair  1971    bilateral  . Back surgery  1976/1979  . Colonoscopy  11/2004    Dr. Sharol Roussel sessile polyp splenic flexure, 59mm sessile polyp desc colon, tubulovillous adenoma (bx not removed)  . Colonoscopy  01/2005    poor prep, polyp could not be found  . Colonoscopy  05/2005    with EMR, polypectomy Dr. Olegario Messier, bx showed high grade dysplasia, partially resected  . Colonoscopy  09/2005    Dr. Arsenio Loader, [place] ink tattooing, four villous colon polyp (3 had been missed on previous colonoscopies due to limitations of procedures  . Colon surgery  09/2005    Fleishman: four tubular adenomas, large adenomatous polyp with HIGH GRADE dysplasia  . Appendectomy  09/2005    at time of left hemicolectomy  . Colonoscopy  09/2006    normal TI, no polyps  . Colonoscopy  10/2007    Dr. Imogene Burn distal mammillations, benign bx, normal TI, random bx neg for microscopic colitis  . Cholecystectomy      Dr. Tamala Julian  . Suprapubic catheter insertion      Social History:  reports that he has quit smoking. His smoking use included Cigarettes. He has never used smokeless tobacco. He reports that he does not drink alcohol or use  illicit drugs.  Family History:  Family History  Problem Relation Age of Onset  . Colon cancer Neg Hx   . Cirrhosis Brother     etoh  . Stroke Mother 50  . Coronary artery disease Father 21  . Heart attack Brother   . Multiple sclerosis    . Cancer Sister      Prior to Admission medications   Medication Sig Start Date End Date Taking? Authorizing Provider  aspirin 81 MG chewable tablet Chew 1 tablet (81 mg total) by mouth daily. 07/26/14  Yes Verlee Monte, MD  carvedilol (COREG) 6.25 MG tablet Take 1 tablet (6.25 mg total) by mouth 2 (two) times daily with a meal. Patient taking differently: Take 6.25 mg by mouth daily.  07/26/14  Yes Verlee Monte, MD  diazepam (VALIUM) 5 MG tablet Take 1 tablet (5 mg total) by mouth 2 (two) times daily. 07/18/15  Yes Kathrynn Ducking, MD  HYDROcodone-acetaminophen ([place]) 7.5-325 MG per tablet Take 1 tablet by mouth 2 (two) times daily. Max APAP 3 GM IN 24 HOURS FROM ALL SOURCES 08/22/14  Yes Mahima Pandey, MD  levofloxacin (LEVAQUIN) 500 MG tablet Take 500 mg by mouth daily. For 14 days(started 08/02/15) 08/02/15  Yes Historical Provider, MD  levothyroxine (SYNTHROID, LEVOTHROID) 88 MCG tablet Take 88 mcg by mouth daily before breakfast.   Yes Historical Provider, MD  potassium chloride SA (K-DUR,KLOR-CON) 20 MEQ tablet Take 20 mEq by mouth daily.   Yes Rexene Alberts, MD  verapamil (CALAN) 40 MG tablet Take 40 mg by mouth 2 (two) times daily.   Yes Historical Provider, MD    Physical Exam: Filed Vitals:   08/04/15 0542 08/04/15 1430 08/04/15 1857 08/04/15 2115  BP: 154/72 113/56 149/61 129/77  Pulse: 65 56 59 68  Temp: 98.5 F (36.9 C) 98.9 F (37.2 C) 98.8 F (37.1 C) 98.7 F (37.1 C)  TempSrc: Oral  Oral Oral  Resp: 18 18 18 18   Height:      Weight: 57.561 kg (126 lb 14.4 oz)     SpO2: 98% 95% 94% 95%   General: Not in acute distress HEENT:       Eyes: PERRL, EOMI, no scleral icterus.       ENT: No discharge from the ears and nose, no  pharynx injection, no tonsillar enlargement.        Neck: No JVD, no mass felt. Heme: No neck lymph node enlargement. Cardiac: S1/S2, RRR, No murmurs, No gallops or rubs. Pulm: No rales, wheezing, rhonchi or rubs. Abd: Soft, nondistended, has tenderness around suprapubic cath area, no rebound pain, no organomegaly, BS present.  Ext: No pitting leg edema bilaterally. 2+DP/PT pulse bilaterally. Musculoskeletal: No joint deformities, No joint redness or warmth, no limitation of ROM in spin. Skin: No rashes.  Neuro: Alert, oriented X3, cranial nerves II-XII grossly intact, muscle strength 5/5 in all extremities, sensation to light touch intact.  Psych: Patient is not psychotic, no suicidal or hemocidal ideation.  Labs on Admission:  Basic Metabolic Panel:  Recent Labs Lab 08/03/15 1207 08/04/15 0634  NA 137 137  K 5.5* 5.8*  CL 102 106  CO2 24 21*  GLUCOSE 123* 117*  BUN 45* 61*  CREATININE 4.44* 6.37*  CALCIUM 9.4 8.4*   Liver Function Tests:  Recent Labs Lab 08/03/15 1207  AST 22  ALT 15*  ALKPHOS 112  BILITOT 1.1  PROT 7.7  ALBUMIN 3.9   No results for input(s): LIPASE, AMYLASE in the last 168 hours. No results for input(s): AMMONIA in the last 168 hours. CBC:  Recent Labs Lab 08/03/15 1207 08/04/15 0634  WBC 23.6* 14.4*  NEUTROABS 20.0*  --   HGB 14.6 12.2*  HCT 43.1 37.2*  MCV 91.3 92.5  PLT 118* 93*   Cardiac Enzymes: No results for input(s): CKTOTAL, CKMB, CKMBINDEX, TROPONINI in the last 168 hours.  BNP (last 3 results) No results for input(s): BNP in the last 8760 hours.  ProBNP (last 3 results) No results for input(s): PROBNP in the last 8760 hours.  CBG: No results for input(s): GLUCAP in the last 168 hours.  Radiological Exams on Admission: Ct Abdomen Pelvis Wo Contrast  08/04/2015  CLINICAL DATA:  74 year old male with history of acute tubular necrosis and no urine output for the past 3 days. History of suprapubic catheter in place for the  past 3 years. EXAM: CT ABDOMEN AND PELVIS WITHOUT CONTRAST TECHNIQUE: Multidetector CT imaging of the abdomen and pelvis was performed following the standard protocol without IV contrast. COMPARISON:  CT the abdomen and pelvis 07/18/2014. FINDINGS: Lower chest: Peripheral ground-glass attenuation and subpleural reticulation in the visualize lung bases, concerning for underlying interstitial lung disease. Small left pleural effusion lying dependently. Calcifications of  the aortic valve. Atherosclerotic calcifications in the right coronary artery. Atherosclerotic calcifications in the left circumflex and right coronary arteries. Hepatobiliary: No definite cystic or solid hepatic lesions are identified on today's noncontrast CT examination. Status post cholecystectomy. 16 x 10 x 7 mm partially calcified stone in the distal common bile duct (image 32 of series 2). Although poorly evaluated on today's noncontrast CT examination, there does appear to be ductal dilatation, as the common bile duct measures at least 12 mm in the porta hepatis. Pancreas: No definite pancreatic mass or peripancreatic inflammatory changes on today's noncontrast CT examination. Spleen: Unremarkable. Adrenals/Urinary Tract: 10 mm calculus at the left ureteropelvic junction associated with moderate to severe proximal hydronephrosis and perinephric stranding. Multiple additional nonobstructive calculi are present within the collecting systems of the kidneys bilaterally, the largest of which is in the lower pole of the left kidney measuring 9 mm. No additional ureteral stones are noted. 9 mm stone in the inferior aspect of the urinary bladder. Right kidney is atrophic. Multiple low-attenuation lesions in the left kidney again noted, incompletely characterized on today's noncontrast CT examination, but similar to the prior study, likely cysts, largest of which measures 2.6 cm extending exophytic the off the lateral aspect of the upper pole of the  left kidney. Urinary bladder is nearly completely decompressed with an indwelling suprapubic catheter in position. Bilateral adrenal glands are normal in appearance. Stomach/Bowel: The unenhanced appearance of the stomach is normal. Suture line in the proximal sigmoid colon, and absence of much of the left hemicolon related to prior partial colectomy. Status post appendectomy. Vascular/Lymphatic: Extensive atherosclerosis throughout the abdominal and pelvic vasculature, without evidence of aneurysm. Notably, the distal abdominal aorta is very diminutive in size, as are the proximal common iliac arteries, which could indicate chronic occlusion. Multiple borderline enlarged upper abdominal lymph nodes, largest of which is a high left para-aortic lymph node adjacent to the left renal hilum measuring 11 mm in short axis. This is likely reactive. Reproductive: Prostate gland and seminal vesicles are unremarkable in appearance. Other: Ventral hernia in the lower abdomen containing multiple loops of small bowel, without evidence of incarceration or obstruction at this time. Trace volume of ascites. No pneumoperitoneum. Musculoskeletal: There are no aggressive appearing lytic or blastic lesions noted in the visualized portions of the skeleton. IMPRESSION: 1. Obstructive 10 mm calculus at the left ureteropelvic junction with with moderate to severe proximal hydronephrosis and perinephric stranding. 2. Multiple additional nonobstructive calculi are present within the collecting systems of the kidneys bilaterally, and in the lumen of the urinary bladder. 3. Large partially calcified stone in the distal common bile duct associated with proximal hepatic ductal dilatation. This appears to be chronic, and is similar to remote prior study 07/18/2014. 4. Trace volume of ascites. 5. Right renal atrophy. 6. Small left pleural effusion lying dependently. 7. Findings in the lung bases suggestive of potential interstitial lung disease.  This could be further evaluated with nonemergent high-resolution chest CT and referral to Pulmonology in the near future if clinically appropriate. 8. Extensive atherosclerosis, including at least 2 vessel coronary artery disease. Additionally, the distal infrarenal abdominal aorta and proximal common iliac arteries are very diminutive in size, which could indicate chronic occlusion or high-grade stenosis. 9. Large ventral hernia in the anterior abdominal [place] containing multiple loops of small bowel, without evidence of incarceration or obstruction at this time. 10. Additional incidental findings, as above. Electronically Signed   By: Vinnie Langton M.D.   On: 08/04/2015 16:20  X-ray Chest Pa And Lateral  08/03/2015  CLINICAL DATA:  74 year old male with fever EXAM: CHEST  2 VIEW COMPARISON:  Prior chest x-ray 07/18/2014 FINDINGS: Cardiac and mediastinal contours are within normal limits no focal airspace consolidation, pulmonary edema, pleural effusion or pneumothorax. Atherosclerotic calcifications are present in the transverse aorta. Mild central bronchitic change remains. No acute osseous abnormality. IMPRESSION: No active cardiopulmonary process. Electronically Signed   By: Jacqulynn Cadet M.D.   On: 08/03/2015 14:07   US Renal  08/04/2015  CLINICAL DATA:  Acute tubular necrosis. EXAM: RENAL / URINARY TRACT ULTRASOUND COMPLETE COMPARISON:  08/07/2014. FINDINGS: Right Kidney: Length: 8.3 cm. Increased echogenicity with progression. Diffuse parenchymal thinning and prominent renal sinus fat, also with progression. 6 mm calculus in the mid kidney. No hydronephrosis. Left Kidney: Length: 13.2 cm. Interval diffusely increased echogenicity. Interval mild to moderate dilatation of the collecting system. 10 mm calculus in the kidney. Bladder: Not distended, with a suprapubic catheter in place. IMPRESSION: 1. Interval mild to moderate left hydronephrosis, most likely due to a nonvisualized ureteral calculus.  2. Bilateral nonobstructing renal calculi. 3. Interval increased echogenicity of both kidneys, compatible with medical renal disease. 4. Progressive right renal atrophy. Electronically Signed   By: Claudie Revering M.D.   On: 08/04/2015 11:14    EKG: Independently reviewed. QTC 453, early R-wave progression, mildly peaking T in V2.   Assessment/Plan Principal Problem:   Hydronephrosis with obstructing calculus Active Problems:   Hyperkalemia   Junctional rhythm   Multiple sclerosis (HCC)   UTI (lower urinary tract infection)   HTN (hypertension), malignant   Chronic indwelling Foley catheter   Pulmonary fibrosis (HCC)   Hypothyroidism   Dilated cardiomyopathy (HCC)   Pressure ulcer   Acute renal failure superimposed on stage 3 chronic kidney disease (HCC)   Chronic systolic congestive heart failure (HCC)  Hydronephrosis with obstructing calculus: As evidenced by CT-abd/pelvis. Urology was consulted. Per Dr. Lyndal Rainbow note,  rec left Nx tube for max drain which could also be used for left PCNL to clear UPJ and LLP in future when stable. Stent with chronic cystitis/SP tube might be difficult to place. He discussed with Dr. Theador Hawthorne. Full consult to follow. Dr. Sheran Fava spoke with Dr. Pascal Lux from IR who is covering tonight. If the patient is SICK when he arrives (hypotensive, septic shocky), he will come in tonight to place the nephrostomy tube. If he is relatively stable, he will add him to the schedule for tomorrow. Currently pt is hemodynamically stable with blood pressure 129/77, no tachycardia, temperature normal, will not call Dr. Pascal Lux now.  -will keep pt in SDU -treat UTI as below -follow up urology's further recommendations -Nothing by mouth for possible IR procedure in AM -INR/PTT/type & screen -NPO  UTI: has positive UA and leukocytosis. Not septic on admission -received Levaquin, but pt is allergic to Cipro -switch to Rocephin now.  Hyperkalemia: Potassium of 5.8 which is mildly  peaking T in V2. Pt was given two dose of Kayexalate, 15 g 2, without making good bowel movement so far. -will treat with Novology 5 units and D50 now - give another dose of Kayexalate, 15 g 1 -repeat BMP  Addendum: the repeated BMP showed K=4.8. -will d/c insulin and Kayexalate   Multiple sclerosis ([place]): stable. No taking meds -Observe closely  HTN:  -on Coreg and Verapamil -will d/c both if bp drops  Hypothyroidism: Last TSH was not recorded -Continue home Synthroid -Check TSH  Chronic systolic congestive heart failure: Echo on  07/18/14 showed EF of 30-35%. Patient does not have leg edema, CHF is compensated. He is not taking diuretics at home. - check BNP -continue ASA  Acute renal failure superimposed on stage 3 chronic kidney disease ([place]): Creatinine 6.37, BUN 61, Most likely due to hydronephrosis and a UTI. Renal was consulted. -will check FeNa -follow up by BMP -follow up renal activations. -gentle IVF: ns at 75 cc/h   DVT ppx: SCD  Code Status: Partial Code (OK with CPR, but not intubation) Family Communication: None at bed side.  Disposition Plan: Admit to inpatient   Date of Service 08/04/2015    Ivor Costa [facility] Pager [phone]  If 7PM-7AM, please contact night-coverage [email or web address] Password [email or web address] 08/04/2015, 11:57 PM

## 2015-08-04 NOTE — Progress Notes (Addendum)
   Pt with 10 x 10 left UPJ stone and a 9 mm LLP stone. Atrophic right kidney. He has been anuric with low grade temps and elevated CBC. Rec left Nx tube for max drain which could also be used for left PCNL to clear UPJ and LLP in future when stable. Stent with chronic cystitis/SP tube might be difficult to place. Discussed with Dr. Theador Hawthorne. Full consult to follow.

## 2015-08-04 NOTE — Progress Notes (Signed)
Patient continues to not have any urine output at this time. Spoke to Dr. Cindie Laroche about this and he says that he is already aware of this. No new orders at this time. Will continue to monitor closely.

## 2015-08-04 NOTE — Plan of Care (Signed)
Problem: Fluid Volume: Goal: Ability to maintain a balanced intake and output will improve Outcome: Not Progressing Patient continues to be be anuric.

## 2015-08-04 NOTE — H&P (Signed)
Jordan Ward MRN: [id number] DOB/AGE: Jun 17, 1941 74 y.o. Primary Care Physician:Cniyah Sproull L, MD Admit date: 08/03/2015 Chief Complaint: fever HPI: this is a redo of a H&P dictated from my office on the day of admission that has not appeared on the chart yet. I am redoing it since he is being transferred. He was seen in my office on the third with presumed UTI. He has fever to 101 and abdominal pain and abnormal U/A. Admission was offered but he wanted to try outpatient treatment. He came back on the 4th as scheduled and requested  Admission. He was febrile and appeared sick. No chest pain . No N,V or diarrhea.  Past Medical History  Diagnosis Date  . Arthritis   . MS (multiple sclerosis) ([place])   . Insomnia   . PVD (peripheral vascular disease) ([place])   . Carotid artery stenosis   . TIA (transient ischemic attack)   . Chronic indwelling Foley catheter 10/06/2011  . Pulmonary fibrosis ([place]) 10/06/2011  . Dysphagia 10/07/2011  . Tremors of nervous system 10/08/2011  . Hypothyroidism 10/08/2011  . Pulmonary nodule 10/08/2011  . C. difficile colitis 09/2011  . Pneumonia   . Encephalopathy   . Urinary tract infection   . Tremors of nervous system   . Hypokalemia   . Junctional rhythm   . Anemia   . Hypernatremia   . Sacral ulcer   . HTN (hypertension), malignant 10/06/2011  . Sleep apnea   . Neuromuscular disorder ([place])   . Back pain, chronic   . Kidney stones   . High grade dysplasia in colonic adenoma 09/2005  . Gait disorder   . OSA (obstructive sleep apnea)   . Paroxysmal atrial tachycardia ([place])   . Dyslipidemia   . Cerebrovascular disease   . CAD (coronary artery disease)   . Peripheral vascular disease ([place])   . Bilateral carotid bruits   . Colon polyps   . HA (headache)    Past Surgical History  Procedure Laterality Date  . Inguinal hernia repair  1971    bilateral  . Back surgery  1976/1979  . Colonoscopy  11/2004    Dr. Sharol Roussel sessile polyp splenic flexure, 56mm  sessile polyp desc colon, tubulovillous adenoma (bx not removed)  . Colonoscopy  01/2005    poor prep, polyp could not be found  . Colonoscopy  05/2005    with EMR, polypectomy Dr. Olegario Messier, bx showed high grade dysplasia, partially resected  . Colonoscopy  09/2005    Dr. Arsenio Loader, [place] ink tattooing, four villous colon polyp (3 had been missed on previous colonoscopies due to limitations of procedures  . Colon surgery  09/2005    [place]man: four tubular adenomas, large adenomatous polyp with HIGH GRADE dysplasia  . Appendectomy  09/2005    at time of left hemicolectomy  . Colonoscopy  09/2006    normal TI, no polyps  . Colonoscopy  10/2007    Dr. Imogene Burn distal mammillations, benign bx, normal TI, random bx neg for microscopic colitis  . Cholecystectomy      Dr. Tamala Julian  . Suprapubic catheter insertion          Family History  Problem Relation Age of Onset  . Colon cancer Neg Hx   . Cirrhosis Brother     etoh  . Stroke Mother 70  . Coronary artery disease Father 30  . Heart attack Brother   . Multiple sclerosis    . Cancer Sister     Social History:  reports  that he has quit smoking. His smoking use included Cigarettes. He has never used smokeless tobacco. He reports that he does not drink alcohol or use illicit drugs.   Allergies:  Allergies  Allergen Reactions  . Tetracyclines & Related Anaphylaxis and Rash  . Ciprofloxacin     Trouble swallowing    Medications Prior to Admission  Medication Sig Dispense Refill  . aspirin 81 MG chewable tablet Chew 1 tablet (81 mg total) by mouth daily.    . carvedilol (COREG) 6.25 MG tablet Take 1 tablet (6.25 mg total) by mouth 2 (two) times daily with a meal. (Patient taking differently: Take 6.25 mg by mouth daily. )    . diazepam (VALIUM) 5 MG tablet Take 1 tablet (5 mg total) by mouth 2 (two) times daily. 60 tablet 5  . HYDROcodone-acetaminophen (NORCO) 7.5-325 MG per tablet Take 1 tablet by mouth 2 (two) times daily.  Max APAP 3 GM IN 24 HOURS FROM ALL SOURCES 60 tablet 0  . levofloxacin (LEVAQUIN) 500 MG tablet Take 500 mg by mouth daily. For 14 days(started 08/02/15)    . levothyroxine (SYNTHROID, LEVOTHROID) 88 MCG tablet Take 88 mcg by mouth daily before breakfast.    . potassium chloride SA (K-DUR,KLOR-CON) 20 MEQ tablet Take 20 mEq by mouth daily.    . verapamil (CALAN) 40 MG tablet Take 40 mg by mouth 2 (two) times daily.         [id number] from the symptoms mentioned above,there are no other symptoms referable to all systems reviewed.  Physical Exam: Blood pressure 113/56, pulse 56, temperature 98.9 F (37.2 C), temperature source Oral, resp. rate 18, height 5\' 5"  (1.651 m), weight 57.561 kg (126 lb 14.4 oz), SpO2 95 %. He looks  Chronically and acutely ill. He is febrile. His HEENT shows pupils reactive, mucus membranes are dry and  TMs ok. Neck supple. Chest with chronic rales.  His heart is regular. Abdomen soft with mild tenderness in the suprapubic area and suprapubic catheter.  extremeties ok. Chronic neurological problems from MS.    Recent Labs  08/03/15 1207 08/04/15 0634  WBC 23.6* 14.4*  NEUTROABS 20.0*  --   HGB 14.6 12.2*  HCT 43.1 37.2*  MCV 91.3 92.5  PLT 118* 93*    Recent Labs  08/03/15 1207 08/04/15 0634  NA 137 137  K 5.5* 5.8*  CL 102 106  CO2 24 21*  GLUCOSE 123* 117*  BUN 45* 61*  CREATININE 4.44* 6.37*  CALCIUM 9.4 8.4*  lablast2(ast:2,ALT:2,alkphos:2,bilitot:2,prot:2,albumin:2)@    No results found for this or any previous visit (from the past 240 hour(s)).   Ct Abdomen Pelvis Wo Contrast  08/04/2015  CLINICAL DATA:  73 year old male with history of acute tubular necrosis and no urine output for the past 3 days. History of suprapubic catheter in place for the past 3 years. EXAM: CT ABDOMEN AND PELVIS WITHOUT CONTRAST TECHNIQUE: Multidetector CT imaging of the abdomen and pelvis was performed following the standard protocol without IV contrast.  COMPARISON:  CT the abdomen and pelvis 07/18/2014. FINDINGS: Lower chest: Peripheral ground-glass attenuation and subpleural reticulation in the visualize lung bases, concerning for underlying interstitial lung disease. Small left pleural effusion lying dependently. Calcifications of the aortic valve. Atherosclerotic calcifications in the right coronary artery. Atherosclerotic calcifications in the left circumflex and right coronary arteries. Hepatobiliary: No definite cystic or solid hepatic lesions are identified on today's noncontrast CT examination. Status post cholecystectomy. 16 x 10 x 7 mm partially calcified stone in the  distal common bile duct (image 32 of series 2). Although poorly evaluated on today's noncontrast CT examination, there does appear to be ductal dilatation, as the common bile duct measures at least 12 mm in the porta hepatis. Pancreas: No definite pancreatic mass or peripancreatic inflammatory changes on today's noncontrast CT examination. Spleen: Unremarkable. Adrenals/Urinary Tract: 10 mm calculus at the left ureteropelvic junction associated with moderate to severe proximal hydronephrosis and perinephric stranding. Multiple additional nonobstructive calculi are present within the collecting systems of the kidneys bilaterally, the largest of which is in the lower pole of the left kidney measuring 9 mm. No additional ureteral stones are noted. 9 mm stone in the inferior aspect of the urinary bladder. Right kidney is atrophic. Multiple low-attenuation lesions in the left kidney again noted, incompletely characterized on today's noncontrast CT examination, but similar to the prior study, likely cysts, largest of which measures 2.6 cm extending exophytic the off the lateral aspect of the upper pole of the left kidney. Urinary bladder is nearly completely decompressed with an indwelling suprapubic catheter in position. Bilateral adrenal glands are normal in appearance. Stomach/Bowel: The  unenhanced appearance of the stomach is normal. Suture line in the proximal sigmoid colon, and absence of much of the left hemicolon related to prior partial colectomy. Status post appendectomy. Vascular/Lymphatic: Extensive atherosclerosis throughout the abdominal and pelvic vasculature, without evidence of aneurysm. Notably, the distal abdominal aorta is very diminutive in size, as are the proximal common iliac arteries, which could indicate chronic occlusion. Multiple borderline enlarged upper abdominal lymph nodes, largest of which is a high left para-aortic lymph node adjacent to the left renal hilum measuring 11 mm in short axis. This is likely reactive. Reproductive: Prostate gland and seminal vesicles are unremarkable in appearance. Other: Ventral hernia in the lower abdomen containing multiple loops of small bowel, without evidence of incarceration or obstruction at this time. Trace volume of ascites. No pneumoperitoneum. Musculoskeletal: There are no aggressive appearing lytic or blastic lesions noted in the visualized portions of the skeleton. IMPRESSION: 1. Obstructive 10 mm calculus at the left ureteropelvic junction with with moderate to severe proximal hydronephrosis and perinephric stranding. 2. Multiple additional nonobstructive calculi are present within the collecting systems of the kidneys bilaterally, and in the lumen of the urinary bladder. 3. Large partially calcified stone in the distal common bile duct associated with proximal hepatic ductal dilatation. This appears to be chronic, and is similar to remote prior study 07/18/2014. 4. Trace volume of ascites. 5. Right renal atrophy. 6. Small left pleural effusion lying dependently. 7. Findings in the lung bases suggestive of potential interstitial lung disease. This could be further evaluated with nonemergent high-resolution chest CT and referral to Pulmonology in the near future if clinically appropriate. 8. Extensive atherosclerosis,  including at least 2 vessel coronary artery disease. Additionally, the distal infrarenal abdominal aorta and proximal common iliac arteries are very diminutive in size, which could indicate chronic occlusion or high-grade stenosis. 9. Large ventral hernia in the anterior abdominal Moorer containing multiple loops of small bowel, without evidence of incarceration or obstruction at this time. 10. Additional incidental findings, as above. Electronically Signed   By: Vinnie Langton M.D.   On: 08/04/2015 16:20   X-ray Chest Pa And Lateral  08/03/2015  CLINICAL DATA:  74 year old male with fever EXAM: CHEST  2 VIEW COMPARISON:  Prior chest x-ray 07/18/2014 FINDINGS: Cardiac and mediastinal contours are within normal limits no focal airspace consolidation, pulmonary edema, pleural effusion or pneumothorax. Atherosclerotic calcifications are  present in the transverse aorta. Mild central bronchitic change remains. No acute osseous abnormality. IMPRESSION: No active cardiopulmonary process. Electronically Signed   By: Jacqulynn Cadet M.D.   On: 08/03/2015 14:07   US Renal  08/04/2015  CLINICAL DATA:  Acute tubular necrosis. EXAM: RENAL / URINARY TRACT ULTRASOUND COMPLETE COMPARISON:  08/07/2014. FINDINGS: Right Kidney: Length: 8.3 cm. Increased echogenicity with progression. Diffuse parenchymal thinning and prominent renal sinus fat, also with progression. 6 mm calculus in the mid kidney. No hydronephrosis. Left Kidney: Length: 13.2 cm. Interval diffusely increased echogenicity. Interval mild to moderate dilatation of the collecting system. 10 mm calculus in the kidney. Bladder: Not distended, with a suprapubic catheter in place. IMPRESSION: 1. Interval mild to moderate left hydronephrosis, most likely due to a nonvisualized ureteral calculus. 2. Bilateral nonobstructing renal calculi. 3. Interval increased echogenicity of both kidneys, compatible with medical renal disease. 4. Progressive right renal atrophy.  Electronically Signed   By: Claudie Revering M.D.   On: 08/04/2015 11:14   Impression:  Active Problems:   Multiple sclerosis (H[facility])   UTI (lower urinary tract infection)   Pressure ulcer   Acute on chronic renal failure ([facility])     Plan: To be admitted. For iv antibiotics. Iv fluids. Recheck labs in AM.      Betsie Peckman L   08/04/2015, 5:44 PM

## 2015-08-04 NOTE — Progress Notes (Signed)
Discussed with Dr. Cindie Laroche -- needs STAT non-con CT A/P. Reviewed renal u/s, AKI.

## 2015-08-04 NOTE — Progress Notes (Signed)
Subjective: He feels ok.  Objective: Vital signs in last 24 hours: Temp:  [98.5 F (36.9 C)-100.1 F (37.8 C)] 98.5 F (36.9 C) (11/05 0542) Pulse Rate:  [65-74] 65 (11/05 0542) Resp:  [18] 18 (11/05 0542) BP: (128-177)/(69-81) 154/72 mmHg (11/05 0542) SpO2:  [95 %-100 %] 98 % (11/05 0542) Weight:  [56.518 kg (124 lb 9.6 oz)-57.561 kg (126 lb 14.4 oz)] 57.561 kg (126 lb 14.4 oz) (11/05 0542) Weight change:  Last BM Date: 08/01/15  Intake/Output from previous day: 11/04 0701 - 11/05 0700 In: 2056.3 [P.O.:240; I.V.:1716.3; IV Piggyback:100] Out: 15 [Urine:15]  PHYSICAL EXAM General appearance: alert, cooperative and moderate distress Resp: clear to auscultation bilaterally Cardio: regular rate and rhythm, S1, S2 normal, no murmur, click, rub or gallop GI: He is mildly tender low in his abdomen he has a suprapubic catheter Extremities: extremities normal, atraumatic, no cyanosis or edema  Lab Results:  Results for orders placed or performed during the hospital encounter of 08/03/15 (from the past 48 hour(s))  Comprehensive metabolic panel     Status: Abnormal   Collection Time: 08/03/15 12:07 PM  Result Value Ref Range   Sodium 137 135 - 145 mmol/L   Potassium 5.5 (H) 3.5 - 5.1 mmol/L   Chloride 102 101 - 111 mmol/L   CO2 24 22 - 32 mmol/L   Glucose, Bld 123 (H) 65 - 99 mg/dL   BUN 45 (H) 6 - 20 mg/dL   Creatinine, Ser 4.44 (H) 0.61 - 1.24 mg/dL   Calcium 9.4 8.9 - 10.3 mg/dL   Total Protein 7.7 6.5 - 8.1 g/dL   Albumin 3.9 3.5 - 5.0 g/dL   AST 22 15 - 41 U/L   ALT 15 (L) 17 - 63 U/L   Alkaline Phosphatase 112 38 - 126 U/L   Total Bilirubin 1.1 0.3 - 1.2 mg/dL   GFR calc non Af Amer 12 (L) >60 mL/min   GFR calc Af Amer 14 (L) >60 mL/min    Comment: (NOTE) The eGFR has been calculated using the CKD EPI equation. This calculation has not been validated in all clinical situations. eGFR's persistently <60 mL/min signify possible Chronic Kidney Disease.    Anion gap  11 5 - 15  CBC WITH DIFFERENTIAL     Status: Abnormal   Collection Time: 08/03/15 12:07 PM  Result Value Ref Range   WBC 23.6 (H) 4.0 - 10.5 K/uL   RBC 4.72 4.22 - 5.81 MIL/uL   Hemoglobin 14.6 13.0 - 17.0 g/dL   HCT 43.1 39.0 - 52.0 %   MCV 91.3 78.0 - 100.0 fL   MCH 30.9 26.0 - 34.0 pg   MCHC 33.9 30.0 - 36.0 g/dL   RDW 13.7 11.5 - 15.5 %   Platelets 118 (L) 150 - 400 K/uL    Comment: SPECIMEN CHECKED FOR CLOTS   Neutrophils Relative % 85 %   Neutro Abs 20.0 (H) 1.7 - 7.7 K/uL   Lymphocytes Relative 7 %   Lymphs Abs 1.8 0.7 - 4.0 K/uL   Monocytes Relative 7 %   Monocytes Absolute 1.7 (H) 0.1 - 1.0 K/uL   Eosinophils Relative 0 %   Eosinophils Absolute 0.1 0.0 - 0.7 K/uL   Basophils Relative 0 %   Basophils Absolute 0.0 0.0 - 0.1 K/uL  Basic metabolic panel     Status: Abnormal   Collection Time: 08/04/15  6:34 AM  Result Value Ref Range   Sodium 137 135 - 145 mmol/L   Potassium 5.8 (  H) 3.5 - 5.1 mmol/L   Chloride 106 101 - 111 mmol/L   CO2 21 (L) 22 - 32 mmol/L   Glucose, Bld 117 (H) 65 - 99 mg/dL   BUN 61 (H) 6 - 20 mg/dL   Creatinine, Ser 6.37 (H) 0.61 - 1.24 mg/dL   Calcium 8.4 (L) 8.9 - 10.3 mg/dL   GFR calc non Af Amer 8 (L) >60 mL/min   GFR calc Af Amer 9 (L) >60 mL/min    Comment: (NOTE) The eGFR has been calculated using the CKD EPI equation. This calculation has not been validated in all clinical situations. eGFR's persistently <60 mL/min signify possible Chronic Kidney Disease.    Anion gap 10 5 - 15  CBC     Status: Abnormal   Collection Time: 08/04/15  6:34 AM  Result Value Ref Range   WBC 14.4 (H) 4.0 - 10.5 K/uL   RBC 4.02 (L) 4.22 - 5.81 MIL/uL   Hemoglobin 12.2 (L) 13.0 - 17.0 g/dL   HCT 37.2 (L) 39.0 - 52.0 %   MCV 92.5 78.0 - 100.0 fL   MCH 30.3 26.0 - 34.0 pg   MCHC 32.8 30.0 - 36.0 g/dL   RDW 13.9 11.5 - 15.5 %   Platelets 93 (L) 150 - 400 K/uL    Comment: SPECIMEN CHECKED FOR CLOTS PLATELET COUNT CONFIRMED BY SMEAR     ABGS No  results for input(s): PHART, PO2ART, TCO2, HCO3 in the last 72 hours.  Invalid input(s): PCO2 CULTURES No results found for this or any previous visit (from the past 240 hour(s)). Studies/Results: X-ray Chest Pa And Lateral  08/03/2015  CLINICAL DATA:  74 year old male with fever EXAM: CHEST  2 VIEW COMPARISON:  Prior chest x-ray 07/18/2014 FINDINGS: Cardiac and mediastinal contours are within normal limits no focal airspace consolidation, pulmonary edema, pleural effusion or pneumothorax. Atherosclerotic calcifications are present in the transverse aorta. Mild central bronchitic change remains. No acute osseous abnormality. IMPRESSION: No active cardiopulmonary process. Electronically Signed   By: Jacqulynn Cadet M.D.   On: 08/03/2015 14:07    Medications:  Prior to Admission:  Prescriptions prior to admission  Medication Sig Dispense Refill Last Dose  . aspirin 81 MG chewable tablet Chew 1 tablet (81 mg total) by mouth daily.   08/02/2015 at Unknown time  . carvedilol (COREG) 6.25 MG tablet Take 1 tablet (6.25 mg total) by mouth 2 (two) times daily with a meal. (Patient taking differently: Take 6.25 mg by mouth daily. )   08/03/2015 at 800  . diazepam (VALIUM) 5 MG tablet Take 1 tablet (5 mg total) by mouth 2 (two) times daily. 60 tablet 5 08/03/2015 at Unknown time  . HYDROcodone-acetaminophen (NORCO) 7.5-325 MG per tablet Take 1 tablet by mouth 2 (two) times daily. Max APAP 3 GM IN 24 HOURS FROM ALL SOURCES 60 tablet 0 08/03/2015 at 0400  . levofloxacin (LEVAQUIN) 500 MG tablet Take 500 mg by mouth daily. For 14 days(started 08/02/15)   08/02/2015 at Unknown time  . levothyroxine (SYNTHROID, LEVOTHROID) 88 MCG tablet Take 88 mcg by mouth daily before breakfast.   08/03/2015 at Unknown time  . potassium chloride SA (K-DUR,KLOR-CON) 20 MEQ tablet Take 20 mEq by mouth daily.   08/03/2015 at Unknown time  . verapamil (CALAN) 40 MG tablet Take 40 mg by mouth 2 (two) times daily.   08/03/2015 at Unknown  time   Scheduled: . aspirin  81 mg Oral Daily  . carvedilol  6.25 mg Oral BID  WC  . diazepam  5 mg Oral BID  . enoxaparin (LOVENOX) injection  30 mg Subcutaneous Q24H  . [START ON 08/05/2015] levofloxacin (LEVAQUIN) IV  500 mg Intravenous Q48H  . levothyroxine  88 mcg Oral QAC breakfast  . verapamil  40 mg Oral BID   Continuous: . sodium chloride 125 mL/hr at 08/04/15 0252   YRY:[id number] **OR** acetaminophen, alum & mag hydroxide-simeth, ondansetron **OR** ondansetron (ZOFRAN) IV, oxyCODONE, RESOURCE THICKENUP CLEAR  Assesment: He was admitted with what is thought to be a urinary tract infection based on his symptoms. He was febrile. He has acute on chronic renal failure. He has chronic bladder problems and has a suprapubic catheter but he's not made much urine. His renal ultrasound is pending and nephrology consultation is pending Active Problems:   UTI (lower urinary tract infection)   Pressure ulcer    Plan: If he has hydronephrosis he will probably need to be transferred to [facility] to see urology. If he does not he has nephrology consultation pending.    LOS: 1 day   Jordan Ward L 08/04/2015, 11:02 AM

## 2015-08-04 NOTE — Progress Notes (Signed)
Notified Dr. Theador Hawthorne of Dr. Lowella Dandy request for patient to be transferred to [facility] to stay.  Dr. Theador Hawthorne stated that I should have Dr. Cindie Laroche to set up the transfer.  I notified Dr. Cindie Laroche to set up the transfer and he stated he did not have computer access at this time to see what I could do on my end to set up the transfer.  I voiced to him that there would need to be a MD to MD talk to have the patient accepted.  I called [facility] and spoke to Dr. Cindie Laroche and set up for him to talk with Dr. Sheran Fava who will be the receiving physician.  New orders given and followed.  The patient and family has been updated about the POC through the evening.

## 2015-08-04 NOTE — Progress Notes (Signed)
Report given to [facility] and and the Mendel Ryder the receiving nurse.  They verbalized understanding.  Patient was transferred via stretcher in stable condition.

## 2015-08-04 NOTE — Consult Note (Signed)
Jordan Ward MRN: [id number] DOB/AGE: 06-May-1941 74 y.o. Primary Care Physician:HAWKINS,EDWARD L, MD Admit date: 08/03/2015 Chief Complaint: No chief complaint on file.  HPI: Pt is 74 year old male with past medical hx of CVA who came to ER with c/o not feeling well.   HPI dates back to [facility] Thursday when pt started not feeling well and he went to see his PCP. Pt was thought to have UTI and started on antibiotics Pt later started having abdominal pain, lower quadrant, constant, increases n movement Pt main concern is " I am not putting out anything" Pt also c/o back pain  NO c/o chest pain NO c/o dyspnea NO c/o fever/cough/chills no c/o syncope NO c/o hematuria  Past Medical History  Diagnosis Date  . Arthritis   . MS (multiple sclerosis) ([place])   . Insomnia   . PVD (peripheral vascular disease) ([place])   . Carotid artery stenosis   . TIA (transient ischemic attack)   . Chronic indwelling Foley catheter 10/06/2011  . Pulmonary fibrosis ([place]) 10/06/2011  . Dysphagia 10/07/2011  . Tremors of nervous system 10/08/2011  . Hypothyroidism 10/08/2011  . Pulmonary nodule 10/08/2011  . C. difficile colitis 09/2011  . Pneumonia   . Encephalopathy   . Urinary tract infection   . Tremors of nervous system   . Hypokalemia   . Junctional rhythm   . Anemia   . Hypernatremia   . Sacral ulcer   . HTN (hypertension), malignant 10/06/2011  . Sleep apnea   . Neuromuscular disorder ([place])   . Back pain, chronic   . Kidney stones   . High grade dysplasia in colonic adenoma 09/2005  . Gait disorder   . OSA (obstructive sleep apnea)   . Paroxysmal atrial tachycardia ([place])   . Dyslipidemia   . Cerebrovascular disease   . CAD (coronary artery disease)   . Peripheral vascular disease ([place])   . Bilateral carotid bruits   . Colon polyps   . HA (headache)       Family History  Problem Relation Age of Onset  . Colon cancer Neg Hx   . Cirrhosis Brother     etoh  . Stroke Mother 60  . Coronary artery  disease Father 4  . Heart attack Brother   . Multiple sclerosis    . Cancer Sister     Social History:  reports that he has quit smoking. His smoking use included Cigarettes. He has never used smokeless tobacco. He reports that he does not drink alcohol or use illicit drugs.   Allergies:  Allergies  Allergen Reactions  . Tetracyclines & Related Anaphylaxis and Rash  . Ciprofloxacin     Trouble swallowing    Medications Prior to Admission  Medication Sig Dispense Refill  . aspirin 81 MG chewable tablet Chew 1 tablet (81 mg total) by mouth daily.    . carvedilol (COREG) 6.25 MG tablet Take 1 tablet (6.25 mg total) by mouth 2 (two) times daily with a meal. (Patient taking differently: Take 6.25 mg by mouth daily. )    . diazepam (VALIUM) 5 MG tablet Take 1 tablet (5 mg total) by mouth 2 (two) times daily. 60 tablet 5  . HYDROcodone-acetaminophen (NORCO) 7.5-325 MG per tablet Take 1 tablet by mouth 2 (two) times daily. Max APAP 3 GM IN 24 HOURS FROM ALL SOURCES 60 tablet 0  . levofloxacin (LEVAQUIN) 500 MG tablet Take 500 mg by mouth daily. For 14 days(started 08/02/15)    . levothyroxine (  SYNTHROID, LEVOTHROID) 88 MCG tablet Take 88 mcg by mouth daily before breakfast.    . potassium chloride SA (K-DUR,KLOR-CON) 20 MEQ tablet Take 20 mEq by mouth daily.    . verapamil (CALAN) 40 MG tablet Take 40 mg by mouth 2 (two) times daily.         [id number] from the symptoms mentioned above,there are no other symptoms referable to all systems reviewed.  Marland Kitchen aspirin  81 mg Oral Daily  . carvedilol  6.25 mg Oral BID WC  . diazepam  5 mg Oral BID  . enoxaparin (LOVENOX) injection  30 mg Subcutaneous Q24H  . [START ON 08/05/2015] levofloxacin (LEVAQUIN) IV  500 mg Intravenous Q48H  . levothyroxine  88 mcg Oral QAC breakfast  . verapamil  40 mg Oral BID       Physical Exam: Vital signs in last 24 hours: Temp:  [98.5 F (36.9 C)-99.5 F (37.5 C)] 98.5 F (36.9 C) (11/05 0542) Pulse Rate:   [65-74] 65 (11/05 0542) Resp:  [18] 18 (11/05 0542) BP: (154-177)/(69-72) 154/72 mmHg (11/05 0542) SpO2:  [95 %-100 %] 98 % (11/05 0542) Weight:  [124 lb 9.6 oz (56.518 kg)-126 lb 14.4 oz (57.561 kg)] 126 lb 14.4 oz (57.561 kg) (11/05 0542) Weight change:  Last BM Date: 08/01/15  Intake/Output from previous day: 11/04 0701 - 11/05 0700 In: 2056.3 [P.O.:240; I.V.:1716.3; IV Piggyback:100] Out: 15 [Urine:15]     Physical Exam: General- pt is awake,alert, oriented to time place and person Resp- No acute REsp distress,Rhonchi+ at bases CVS- S1S2 regular in rate and rhythm GIT- BS+, soft, tender +lower quadrant EXT- NO LE Edema, Cyanosis CNS- CN 2-12 grossly intact   Lab Results: CBC  Recent Labs  08/03/15 1207 08/04/15 0634  WBC 23.6* 14.4*  HGB 14.6 12.2*  HCT 43.1 37.2*  PLT 118* 93*    BMET  Recent Labs  08/03/15 1207 08/04/15 0634  NA 137 137  K 5.5* 5.8*  CL 102 106  CO2 24 21*  GLUCOSE 123* 117*  BUN 45* 61*  CREATININE 4.44* 6.37*  CALCIUM 9.4 8.4*    Creat Trend 2016 4.4 2015 1.6--3.9  MICRO No results found for this or any previous visit (from the past 240 hour(s)).    Lab Results  Component Value Date   CALCIUM 8.4* 08/04/2015   PHOS 3.1 07/18/2014   Renal U/s  FINDINGS: Right Kidney:  Length: 8.3 cm. Increased echogenicity with progression. Diffuse parenchymal thinning and prominent renal sinus fat, also with progression. 6 mm calculus in the mid kidney. No hydronephrosis.  Left Kidney:  Length: 13.2 cm. Interval diffusely increased echogenicity. Interval mild to moderate dilatation of the collecting system. 10 mm calculus in the kidney.  Bladder:  Not distended, with a suprapubic catheter in place.  IMPRESSION: 1. Interval mild to moderate left hydronephrosis, most likely due to a nonvisualized ureteral calculus. 2. Bilateral nonobstructing renal calculi. 3. Interval increased echogenicity of both kidneys,  compatible with medical renal disease. 4. Progressive right renal atrophy.  Impression: 1)Renal  AKI secondary toPost renal                AKI on CKD               CKD stage 3 .               CKD since 2015               CKD secondary to Low glomerular mass ( single  functioning kidney as one kidney only 8 cm)                                                Post renal                                                 HTN                Progression of CKD marked with AKI                 Nephrolithiasis Hx Present   2)HTN BPat goal  Medication- On Calcium Channel Blockers On Alpha and beta Blockers   3)Anemia HGb stable  4)CKD Mineral-Bone Disorder PTH not avail. Phosphorus will check Calcium at goal.  5)ID- admitted with UTI Primary MD following  6)Electrolytes Hyperkalemic NOrmonatremic    7)Acid base Co2 at goal     Plan:  Will ask for kayexalate as hyperkalemia Will ask for  CT abdome without contrast. Depending upon the results- if shows obstruction/ calculus. Pt may need to be [facility] secondary to Urologist/Anesthesia availability issues. IN case  Not transferred then will ask for Surgery consult to place temporary access and start dialysis. I have has extensive discussion with pt , pt family about this.       Jordan Ward S 08/04/2015, 2:02 PM

## 2015-08-04 NOTE — Progress Notes (Signed)
Discussed patient care with the patient, family, dr. Marylene Buerger, and dr. Cindie Laroche.  Notified Dr. Marylene Buerger of the patients U/S report.  MD came back to the unit to discuss plan of care to the family.  New orders given and followed.

## 2015-08-05 ENCOUNTER — Inpatient Hospital Stay (HOSPITAL_COMMUNITY): Payer: Medicare Other

## 2015-08-05 ENCOUNTER — Encounter (HOSPITAL_COMMUNITY): Payer: Self-pay | Admitting: Radiology

## 2015-08-05 LAB — BASIC METABOLIC PANEL
Anion gap: 13 (ref 5–15)
BUN: 70 mg/dL — AB (ref 6–20)
CALCIUM: 8.4 mg/dL — AB (ref 8.9–10.3)
CO2: 20 mmol/L — ABNORMAL LOW (ref 22–32)
CREATININE: 7.36 mg/dL — AB (ref 0.61–1.24)
Chloride: 104 mmol/L (ref 101–111)
GFR calc Af Amer: 8 mL/min — ABNORMAL LOW (ref >60)
GFR, EST NON AFRICAN AMERICAN: 7 mL/min — AB (ref >60)
GLUCOSE: 107 mg/dL — AB (ref 65–99)
Potassium: 4.8 mmol/L (ref 3.5–5.1)
SODIUM: 137 mmol/L (ref 135–145)

## 2015-08-05 LAB — COMPREHENSIVE METABOLIC PANEL
ALT: 9 U/L — AB (ref 17–63)
ANION GAP: 13 (ref 5–15)
AST: 10 U/L — ABNORMAL LOW (ref 15–41)
Albumin: 2.3 g/dL — ABNORMAL LOW (ref 3.5–5.0)
Alkaline Phosphatase: 71 U/L (ref 38–126)
BUN: 70 mg/dL — ABNORMAL HIGH (ref 6–20)
CHLORIDE: 111 mmol/L (ref 101–111)
CO2: 20 mmol/L — AB (ref 22–32)
CREATININE: 7.81 mg/dL — AB (ref 0.61–1.24)
Calcium: 8.3 mg/dL — ABNORMAL LOW (ref 8.9–10.3)
GFR, EST AFRICAN AMERICAN: 7 mL/min — AB (ref >60)
GFR, EST NON AFRICAN AMERICAN: 6 mL/min — AB (ref >60)
Glucose, Bld: 103 mg/dL — ABNORMAL HIGH (ref 65–99)
Potassium: 5.3 mmol/L — ABNORMAL HIGH (ref 3.5–5.1)
SODIUM: 144 mmol/L (ref 135–145)
Total Bilirubin: 0.4 mg/dL (ref 0.3–1.2)
Total Protein: 5.4 g/dL — ABNORMAL LOW (ref 6.5–8.1)

## 2015-08-05 LAB — CBC
HCT: 31.3 % — ABNORMAL LOW (ref 39.0–52.0)
HEMOGLOBIN: 10.2 g/dL — AB (ref 13.0–17.0)
MCH: 29.8 pg (ref 26.0–34.0)
MCHC: 32.6 g/dL (ref 30.0–36.0)
MCV: 91.5 fL (ref 78.0–100.0)
PLATELETS: 81 10*3/uL — AB (ref 150–400)
RBC: 3.42 MIL/uL — AB (ref 4.22–5.81)
RDW: 14.5 % (ref 11.5–15.5)
WBC: 8.8 10*3/uL (ref 4.0–10.5)

## 2015-08-05 LAB — BRAIN NATRIURETIC PEPTIDE: B NATRIURETIC PEPTIDE 5: 1190.1 pg/mL — AB (ref 0.0–100.0)

## 2015-08-05 LAB — TYPE AND SCREEN
ABO/RH(D): O POS
ANTIBODY SCREEN: NEGATIVE

## 2015-08-05 LAB — TSH: TSH: 2.077 u[IU]/mL (ref 0.350–4.500)

## 2015-08-05 LAB — LIPID PANEL
CHOLESTEROL: 84 mg/dL (ref 0–200)
HDL: 19 mg/dL — ABNORMAL LOW (ref >40)
LDL CALC: 46 mg/dL (ref 0–99)
TRIGLYCERIDES: 94 mg/dL (ref <150)
Total CHOL/HDL Ratio: 4.4 RATIO
VLDL: 19 mg/dL (ref 0–40)

## 2015-08-05 LAB — ABO/RH: ABO/RH(D): O POS

## 2015-08-05 MED ORDER — FENTANYL CITRATE (PF) 100 MCG/2ML IJ SOLN
INTRAMUSCULAR | Status: AC | PRN
  Administered 2015-08-05 (×3): 50 ug via INTRAVENOUS

## 2015-08-05 MED ORDER — SODIUM POLYSTYRENE SULFONATE 15 GM/60ML PO SUSP
15.0000 g | Freq: Once | ORAL | Status: AC
Start: 2015-08-05 — End: 2015-08-05
  Administered 2015-08-05: 15 g via ORAL
  Filled 2015-08-05: qty 60

## 2015-08-05 MED ORDER — MIDAZOLAM HCL 2 MG/2ML IJ SOLN
INTRAMUSCULAR | Status: AC
  Filled 2015-08-05: qty 6

## 2015-08-05 MED ORDER — IOHEXOL 300 MG/ML  SOLN
50.0000 mL | Freq: Once | INTRAMUSCULAR | Status: DC | PRN
  Administered 2015-08-05: 10 mL via INTRAVENOUS
  Filled 2015-08-05: qty 50

## 2015-08-05 MED ORDER — MIDAZOLAM HCL 2 MG/2ML IJ SOLN
INTRAMUSCULAR | Status: AC | PRN
  Administered 2015-08-05 (×3): 1 mg via INTRAVENOUS

## 2015-08-05 MED ORDER — FENTANYL CITRATE (PF) 100 MCG/2ML IJ SOLN
INTRAMUSCULAR | Status: AC
  Filled 2015-08-05: qty 4

## 2015-08-05 MED ORDER — CEFAZOLIN SODIUM-DEXTROSE 2-3 GM-% IV SOLR
INTRAVENOUS | Status: AC
  Filled 2015-08-05: qty 50

## 2015-08-05 MED ORDER — HEPARIN SODIUM (PORCINE) 1000 UNIT/ML IJ SOLN
INTRAMUSCULAR | Status: AC
  Filled 2015-08-05: qty 1

## 2015-08-05 MED ORDER — CEFAZOLIN SODIUM-DEXTROSE 2-3 GM-% IV SOLR
2.0000 g | Freq: Once | INTRAVENOUS | Status: AC
  Filled 2015-08-05: qty 50

## 2015-08-05 MED ORDER — DEXTROSE 50 % IV SOLN: 50.0000 mL | INTRAVENOUS | Status: DC | PRN

## 2015-08-05 MED ORDER — INSULIN ASPART 100 UNIT/ML ~~LOC~~ SOLN: 5.0000 [IU] | Freq: Once | SUBCUTANEOUS | Status: DC

## 2015-08-05 MED ORDER — CETYLPYRIDINIUM CHLORIDE 0.05 % MT LIQD
7.0000 mL | Freq: Two times a day (BID) | OROMUCOSAL | Status: DC
  Administered 2015-08-05 – 2015-08-09 (×9): 7 mL via OROMUCOSAL

## 2015-08-05 MED ORDER — LIDOCAINE-EPINEPHRINE (PF) 1 %-1:200000 IJ SOLN
INTRAMUSCULAR | Status: AC
  Filled 2015-08-05: qty 30

## 2015-08-05 MED ORDER — SODIUM CHLORIDE 0.9 % IV SOLN
250.0000 mg | Freq: Two times a day (BID) | INTRAVENOUS | Status: DC
  Administered 2015-08-05 – 2015-08-07 (×5): 250 mg via INTRAVENOUS
  Filled 2015-08-05 (×7): qty 250

## 2015-08-05 MED ORDER — LIDOCAINE HCL 1 % IJ SOLN
INTRAMUSCULAR | Status: AC
  Filled 2015-08-05: qty 20

## 2015-08-05 MED ORDER — SODIUM CHLORIDE 0.45 % IV BOLUS
1000.0000 mL | Freq: Once | INTRAVENOUS | Status: AC
  Administered 2015-08-05: 1000 mL via INTRAVENOUS

## 2015-08-05 NOTE — Procedures (Signed)
Successful [place] and fluoroscopic guided placement of a left sided PCN with end coiled and locked in the renal pelvis. PCN connected to gravity bag. Small urine sample sent to lab for analysis.  Successful placement of nontunneled HD catheter with tips terminating within the superior aspect of the right atrium.   The catheter is ready for immediate use.   The patient tolerated both procedures well without immediate post procedural complication.    Ronny Bacon, MD Pager #: [phone]

## 2015-08-05 NOTE — Consult Note (Addendum)
Requested by: Dr. Cindie Laroche Diagnosis: left UPJ stone, ARF   H&P  Chief Complaint: ARF, Left Hydronephrosis  History of Present Illness: 74 yo with h/o SP tube and chronic left hydronephrosis, left renal stones and right atrophic kidney who developed left flank pain and ARF. U/s showed right hydro maybe worse than the past, but no definitive stone. He underwent CT which revealed a 10 mm left UPJ stone and a 9 mm LLP stone. I reviewed all the images. His Cr continues to climb but he remains stable. Of note, he has a small amount of urine in the SP tube drainage bag from the right kidney.  He was seen this AM with his wife and family.      Past Medical History  Diagnosis Date  . Arthritis   . MS (multiple sclerosis) ([place])   . Insomnia   . PVD (peripheral vascular disease) ([place])   . Carotid artery stenosis   . TIA (transient ischemic attack)   . Chronic indwelling Foley catheter 10/06/2011  . Pulmonary fibrosis ([place]) 10/06/2011  . Dysphagia 10/07/2011  . Tremors of nervous system 10/08/2011  . Hypothyroidism 10/08/2011  . Pulmonary nodule 10/08/2011  . C. difficile colitis 09/2011  . Pneumonia   . Encephalopathy   . Urinary tract infection   . Tremors of nervous system   . Hypokalemia   . Junctional rhythm   . Anemia   . Hypernatremia   . Sacral ulcer   . HTN (hypertension), malignant 10/06/2011  . Sleep apnea   . Neuromuscular disorder ([place])   . Back pain, chronic   . Kidney stones   . High grade dysplasia in colonic adenoma 09/2005  . Gait disorder   . OSA (obstructive sleep apnea)   . Paroxysmal atrial tachycardia ([place])   . Dyslipidemia   . Cerebrovascular disease   . CAD (coronary artery disease)   . Peripheral vascular disease ([place])   . Bilateral carotid bruits   . Colon polyps   . HA (headache)    Past Surgical History  Procedure Laterality Date  . Inguinal hernia repair  1971    bilateral  . Back surgery  1976/1979  . Colonoscopy  11/2004    Dr. Sharol Roussel sessile  polyp splenic flexure, 59mm sessile polyp desc colon, tubulovillous adenoma (bx not removed)  . Colonoscopy  01/2005    poor prep, polyp could not be found  . Colonoscopy  05/2005    with EMR, polypectomy Dr. Olegario Messier, bx showed high grade dysplasia, partially resected  . Colonoscopy  09/2005    Dr. Arsenio Loader, [place] ink tattooing, four villous colon polyp (3 had been missed on previous colonoscopies due to limitations of procedures  . Colon surgery  09/2005    Fleishman: four tubular adenomas, large adenomatous polyp with HIGH GRADE dysplasia  . Appendectomy  09/2005    at time of left hemicolectomy  . Colonoscopy  09/2006    normal TI, no polyps  . Colonoscopy  10/2007    Dr. Imogene Burn distal mammillations, benign bx, normal TI, random bx neg for microscopic colitis  . Cholecystectomy      Dr. Tamala Julian  . Suprapubic catheter insertion      Home Medications:  Prescriptions prior to admission  Medication Sig Dispense Refill Last Dose  . aspirin 81 MG chewable tablet Chew 1 tablet (81 mg total) by mouth daily.   08/02/2015 at Unknown time  . carvedilol (COREG) 6.25 MG tablet Take 1 tablet (6.25 mg total) by mouth 2 (  two) times daily with a meal. (Patient taking differently: Take 6.25 mg by mouth daily. )   08/03/2015 at 800  . diazepam (VALIUM) 5 MG tablet Take 1 tablet (5 mg total) by mouth 2 (two) times daily. 60 tablet 5 08/03/2015 at Unknown time  . HYDROcodone-acetaminophen (NORCO) 7.5-325 MG per tablet Take 1 tablet by mouth 2 (two) times daily. Max APAP 3 GM IN 24 HOURS FROM ALL SOURCES 60 tablet 0 08/03/2015 at 0400  . levofloxacin (LEVAQUIN) 500 MG tablet Take 500 mg by mouth daily. For 14 days(started 08/02/15)   08/02/2015 at Unknown time  . levothyroxine (SYNTHROID, LEVOTHROID) 88 MCG tablet Take 88 mcg by mouth daily before breakfast.   08/03/2015 at Unknown time  . potassium chloride SA (K-DUR,KLOR-CON) 20 MEQ tablet Take 20 mEq by mouth daily.   08/03/2015 at Unknown time  . verapamil  (CALAN) 40 MG tablet Take 40 mg by mouth 2 (two) times daily.   08/03/2015 at Unknown time   Allergies:  Allergies  Allergen Reactions  . Tetracyclines & Related Anaphylaxis and Rash  . Ciprofloxacin     Trouble swallowing    Family History  Problem Relation Age of Onset  . Colon cancer Neg Hx   . Cirrhosis Brother     etoh  . Stroke Mother 98  . Coronary artery disease Father 75  . Heart attack Brother   . Multiple sclerosis    . Cancer Sister    Social History:  reports that he has quit smoking. His smoking use included Cigarettes. He has never used smokeless tobacco. He reports that he does not drink alcohol or use illicit drugs.  ROS: A complete review of systems was performed.  All systems are negative except for pertinent findings as noted. ROS   Physical Exam:  Vital signs in last 24 hours: Temp:  [98.3 F (36.8 C)-98.9 F (37.2 C)] 98.5 F (36.9 C) (11/06 0401) Pulse Rate:  [56-75] 75 (11/06 0700) Resp:  [11-22] 15 (11/06 0700) BP: (108-149)/(56-86) 134/86 mmHg (11/06 0700) SpO2:  [94 %-99 %] 95 % (11/06 0700) Weight:  [62 kg (136 lb 11 oz)] 62 kg (136 lb 11 oz) (11/06 0401) General:  Alert and oriented, No acute distress HEENT: Normocephalic, atraumatic Cardiovascular: Regular rate and rhythm Lungs: Regular rate and effort Abdomen: Soft, nontender, nondistended, no abdominal masses, SP tube in place urine clear  Extremities: No edema Neurologic: Grossly intact  Laboratory Data:  Results for orders placed or performed during the hospital encounter of 08/03/15 (from the past 24 hour(s))  Protime-INR     Status: Abnormal   Collection Time: 08/04/15  6:10 PM  Result Value Ref Range   Prothrombin Time 15.8 (H) 11.6 - 15.2 seconds   INR 1.25 0.00 - 1.49  APTT     Status: None   Collection Time: 08/04/15  6:10 PM  Result Value Ref Range   aPTT 36 24 - 37 seconds  MRSA PCR Screening     Status: None   Collection Time: 08/04/15  9:18 PM  Result Value Ref  Range   MRSA by PCR NEGATIVE NEGATIVE  Creatinine, urine, random     Status: None   Collection Time: 08/04/15  9:46 PM  Result Value Ref Range   Creatinine, Urine <10 mg/dL  Sodium, urine, random     Status: None   Collection Time: 08/04/15  9:46 PM  Result Value Ref Range   Sodium, Ur 82 mmol/L  Type and screen [facility]     Status: None   Collection Time: 08/04/15 11:48 PM  Result Value Ref Range   ABO/RH(D) O POS    Antibody Screen NEG    Sample Expiration 0000000   Basic metabolic panel     Status: Abnormal   Collection Time: 08/04/15 11:48 PM  Result Value Ref Range   Sodium 137 135 - 145 mmol/L   Potassium 4.8 3.5 - 5.1 mmol/L   Chloride 104 101 - 111 mmol/L   CO2 20 (L) 22 - 32 mmol/L   Glucose, Bld 107 (H) 65 - 99 mg/dL   BUN 70 (H) 6 - 20 mg/dL   Creatinine, Ser 7.36 (H) 0.61 - 1.24 mg/dL   Calcium 8.4 (L) 8.9 - 10.3 mg/dL   GFR calc non Af Amer 7 (L) >60 mL/min   GFR calc Af Amer 8 (L) >60 mL/min   Anion gap 13 5 - 15  ABO/Rh     Status: None (Preliminary result)   Collection Time: 08/04/15 11:48 PM  Result Value Ref Range   ABO/RH(D) O POS   TSH     Status: None   Collection Time: 08/05/15  2:07 AM  Result Value Ref Range   TSH 2.077 0.350 - 4.500 uIU/mL  Comprehensive metabolic panel     Status: Abnormal   Collection Time: 08/05/15  2:07 AM  Result Value Ref Range   Sodium 144 135 - 145 mmol/L   Potassium 5.3 (H) 3.5 - 5.1 mmol/L   Chloride 111 101 - 111 mmol/L   CO2 20 (L) 22 - 32 mmol/L   Glucose, Bld 103 (H) 65 - 99 mg/dL   BUN 70 (H) 6 - 20 mg/dL   Creatinine, Ser 7.81 (H) 0.61 - 1.24 mg/dL   Calcium 8.3 (L) 8.9 - 10.3 mg/dL   Total Protein 5.4 (L) 6.5 - 8.1 g/dL   Albumin 2.3 (L) 3.5 - 5.0 g/dL   AST 10 (L) 15 - 41 U/L   ALT 9 (L) 17 - 63 U/L   Alkaline Phosphatase 71 38 - 126 U/L   Total Bilirubin 0.4 0.3 - 1.2 mg/dL   GFR calc non Af Amer 6 (L) >60 mL/min   GFR calc Af Amer 7 (L) >60 mL/min   Anion gap 13 5 - 15  CBC      Status: Abnormal   Collection Time: 08/05/15  2:07 AM  Result Value Ref Range   WBC 8.8 4.0 - 10.5 K/uL   RBC 3.42 (L) 4.22 - 5.81 MIL/uL   Hemoglobin 10.2 (L) 13.0 - 17.0 g/dL   HCT 31.3 (L) 39.0 - 52.0 %   MCV 91.5 78.0 - 100.0 fL   MCH 29.8 26.0 - 34.0 pg   MCHC 32.6 30.0 - 36.0 g/dL   RDW 14.5 11.5 - 15.5 %   Platelets 81 (L) 150 - 400 K/uL  Brain natriuretic peptide     Status: Abnormal   Collection Time: 08/05/15  2:07 AM  Result Value Ref Range   B Natriuretic Peptide 1190.1 (H) 0.0 - 100.0 pg/mL  Lipid panel     Status: Abnormal   Collection Time: 08/05/15  2:07 AM  Result Value Ref Range   Cholesterol 84 0 - 200 mg/dL   Triglycerides 94 <150 mg/dL   HDL 19 (L) >40 mg/dL   Total CHOL/HDL Ratio 4.4 RATIO   VLDL 19 0 - 40 mg/dL   LDL Cholesterol 46 0 - 99 mg/dL   Recent Results (from the  past 240 hour(s))  MRSA PCR Screening     Status: None   Collection Time: 08/04/15  9:18 PM  Result Value Ref Range Status   MRSA by PCR NEGATIVE NEGATIVE Final    Comment:        The GeneXpert MRSA Assay (FDA approved for NASAL specimens only), is one component of a comprehensive MRSA colonization surveillance program. It is not intended to diagnose MRSA infection nor to guide or monitor treatment for MRSA infections.    Creatinine:  Recent Labs  08/03/15 1207 08/04/15 0634 08/04/15 2348 08/05/15 0207  CREATININE 4.44* 6.37* 7.36* 7.81*    Impression/Assessment:  Right atrophic kidney, Left UPJ stone, LLP stone, left hydronephrosis, Acute on CRF --   Plan:  Keeping a mind on treating the left UPJ and left LLP stone in the future, I discussed with the patient and family the nature, potential benefits, and risks and alternatives to doing nothing, cystoscopy/left RGP/left ureteral stent, or left Nx tube. I drew them a picture of the anatomy on the dry erase board. We discussed side effects of the proposed treatments, the likelihood of the patient achieving the goals of  the procedures (including future URS vs PCNL), and any potential problems that might occur during the procedure or recuperation. All questions answered. Patient elects to proceed with left perc. Nx tube.    Jacquelynne Guedes 08/05/2015, 8:05 AM

## 2015-08-05 NOTE — Consult Note (Signed)
Renal Service Consult Note [facility]  [facility] 08/05/2015 [place] Requesting Physician:  Dr Cruzita Lederer  Reason for Consult:  Acute on CRF HPI: The patient is a 74 y.o. year-old with hx of MS since age 59, neurogenic bladder (due to [place]) with SP cath, TIA/ PVD, pulm fibrosis, HTN, chronic back pain and CKD w R atrophic kidney and baseline creat of 1.6- 2.0 (Oct '16) who presented 3d ago to PCP not feeling well, was put on abx for possible UTI.  Then developed abd pain and lack of urine output. Renal US showed atrophic R kidney 8.3cm and 13.2 cm L kidney with hydronephrosis. He had pyuria and low grade fever.  He was transferred to [facility] last night and underwent L perc nephrostomy placement this am.  UOP from [place] has been very good, over 100 cc/ hr.  He has a L UPJ stone causing ureteral obstruction and urology is following. This afternoon BP's are low in the 90's, they were up earlier today around 150 /80.    Patient denies any cough, SOB, leg swelling, CP.  No abd pain/ n/v/d.  No joint pain. No rash or HA.    Past Medical History  Past Medical History  Diagnosis Date  . Arthritis   . MS (multiple sclerosis) ([place])   . Insomnia   . PVD (peripheral vascular disease) ([place])   . Carotid artery stenosis   . TIA (transient ischemic attack)   . Chronic indwelling Foley catheter 10/06/2011  . Pulmonary fibrosis ([place]) 10/06/2011  . Dysphagia 10/07/2011  . Tremors of nervous system 10/08/2011  . Hypothyroidism 10/08/2011  . Pulmonary nodule 10/08/2011  . C. difficile colitis 09/2011  . Pneumonia   . Encephalopathy   . Urinary tract infection   . Tremors of nervous system   . Hypokalemia   . Junctional rhythm   . Anemia   . Hypernatremia   . Sacral ulcer   . HTN (hypertension), malignant 10/06/2011  . Sleep apnea   . Neuromuscular disorder ([place])   . Back pain, chronic   . Kidney stones   . High grade dysplasia in colonic adenoma 09/2005  . Gait disorder   . OSA  (obstructive sleep apnea)   . Paroxysmal atrial tachycardia ([place])   . Dyslipidemia   . Cerebrovascular disease   . CAD (coronary artery disease)   . Peripheral vascular disease ([place])   . Bilateral carotid bruits   . Colon polyps   . HA (headache)    Past Surgical History  Past Surgical History  Procedure Laterality Date  . Inguinal hernia repair  1971    bilateral  . Back surgery  1976/1979  . Colonoscopy  11/2004    Dr. Sharol Roussel sessile polyp splenic flexure, 27mm sessile polyp desc colon, tubulovillous adenoma (bx not removed)  . Colonoscopy  01/2005    poor prep, polyp could not be found  . Colonoscopy  05/2005    with EMR, polypectomy Dr. Olegario Messier, bx showed high grade dysplasia, partially resected  . Colonoscopy  09/2005    Dr. Arsenio Loader, [place] ink tattooing, four villous colon polyp (3 had been missed on previous colonoscopies due to limitations of procedures  . Colon surgery  09/2005    [place]: four tubular adenomas, large adenomatous polyp with HIGH GRADE dysplasia  . Appendectomy  09/2005    at time of left hemicolectomy  . Colonoscopy  09/2006    normal TI, no polyps  . Colonoscopy  10/2007  Dr. Imogene Burn distal mammillations, benign bx, normal TI, random bx neg for microscopic colitis  . Cholecystectomy      Dr. Tamala Julian  . Suprapubic catheter insertion     Family History  Family History  Problem Relation Age of Onset  . Colon cancer Neg Hx   . Cirrhosis Brother     etoh  . Stroke Mother 83  . Coronary artery disease Father 39  . Heart attack Brother   . Multiple sclerosis    . Cancer Sister    Social History  reports that he has quit smoking. His smoking use included Cigarettes. He has never used smokeless tobacco. He reports that he does not drink alcohol or use illicit drugs. Allergies  Allergies  Allergen Reactions  . Tetracyclines & Related Anaphylaxis and Rash  . Ciprofloxacin     Trouble swallowing   Home medications Prior to Admission  medications   Medication Sig Start Date End Date Taking? Authorizing Provider  aspirin 81 MG chewable tablet Chew 1 tablet (81 mg total) by mouth daily. 07/26/14  Yes Verlee Monte, MD  carvedilol (COREG) 6.25 MG tablet Take 1 tablet (6.25 mg total) by mouth 2 (two) times daily with a meal. Patient taking differently: Take 6.25 mg by mouth daily.  07/26/14  Yes Verlee Monte, MD  diazepam (VALIUM) 5 MG tablet Take 1 tablet (5 mg total) by mouth 2 (two) times daily. 07/18/15  Yes Kathrynn Ducking, MD  HYDROcodone-acetaminophen ([place]) 7.5-325 MG per tablet Take 1 tablet by mouth 2 (two) times daily. Max APAP 3 GM IN 24 HOURS FROM ALL SOURCES 08/22/14  Yes Mahima Pandey, MD  levofloxacin (LEVAQUIN) 500 MG tablet Take 500 mg by mouth daily. For 14 days(started 08/02/15) 08/02/15  Yes Historical Provider, MD  levothyroxine (SYNTHROID, LEVOTHROID) 88 MCG tablet Take 88 mcg by mouth daily before breakfast.   Yes Historical Provider, MD  potassium chloride SA (K-DUR,KLOR-CON) 20 MEQ tablet Take 20 mEq by mouth daily.   Yes Rexene Alberts, MD  verapamil (CALAN) 40 MG tablet Take 40 mg by mouth 2 (two) times daily.   Yes Historical Provider, MD   Liver Function Tests  Recent Labs Lab 08/03/15 1207 08/05/15 0207  AST 22 10*  ALT 15* 9*  ALKPHOS 112 71  BILITOT 1.1 0.4  PROT 7.7 5.4*  ALBUMIN 3.9 2.3*   No results for input(s): LIPASE, AMYLASE in the last 168 hours. CBC  Recent Labs Lab 08/03/15 1207 08/04/15 0634 08/05/15 0207  WBC 23.6* 14.4* 8.8  NEUTROABS 20.0*  --   --   HGB 14.6 12.2* 10.2*  HCT 43.1 37.2* 31.3*  MCV 91.3 92.5 91.5  PLT 118* 93* 81*   Basic Metabolic Panel  Recent Labs Lab 08/03/15 1207 08/04/15 0634 08/04/15 2348 08/05/15 0207  NA 137 137 137 144  K 5.5* 5.8* 4.8 5.3*  CL 102 106 104 111  CO2 24 21* 20* 20*  GLUCOSE 123* 117* 107* 103*  BUN 45* 61* 70* 70*  CREATININE 4.44* 6.37* 7.36* 7.81*  CALCIUM 9.4 8.4* 8.4* 8.3*    Filed Vitals:   08/05/15  1028 08/05/15 1032 08/05/15 1134 08/05/15 1200  BP: 116/74 118/67 157/84 153/87  Pulse: 66 66  81  Temp:    99.4 F (37.4 C)  TempSrc:    Oral  Resp: 17 18  21   Height:      Weight:      SpO2: 93% 95%  100%   Exam Alert , calm no distress  BP's 90's No rash, cyanosis or gangrene Sclera anicteric, throat clear No jvd Chest is clear bilat RRR no MRG Abd soft ntnd no mass or ascites L flank nephrostomy tube draining bloody urine SP cath minimal UOP LE's no sig edema UE's no edema Neuro is alert, nonfocal  UNa 82, UCr < 10 11/4 CXR no active disease   Assessment: 1. Acute on CRF (baseline creat 1.6-2.0) - in patient w atrophic R kidney and s/p L perc neph tube for stone-related hydronephrosis.  Temp HD cath placed by IR but does not require HD right now, will observe. Looks dry and BP's down, will give fluid bolus and increase IVF's.  Doubt he will need HD but will follow.  2. Ureterolithiasis/ L hydro 3. Multiple sclerosis- walk w a walker 4. HTN on coreg/ verapamil. BP's down , will hold for now   Plan- 1 L bolus, ^ IVF to 150/hr, get BP up. Hold BP meds. Will follow.   Kelly Splinter MD [employer] pager [phone]    cell [phone] 08/05/2015, 4:13 PM

## 2015-08-05 NOTE — Progress Notes (Signed)
Patient with diet orders of nectar thick liquids. However, patient noted with thin consistency coca cola with straw and ice at bedside. Patient states, "I only need thick liquids when I am eating". There was no documentation supporting that. Attempted to initated a swallowing eval, however, since patient has a hx of dysphagia. ST eval order placed as indicated. Patient advised to consume thick liq until ST eval.

## 2015-08-05 NOTE — Progress Notes (Signed)
ANTIBIOTIC CONSULT NOTE - INITIAL  Pharmacy Consult for Primaxin Indication: UTI  Allergies  Allergen Reactions  . Tetracyclines & Related Anaphylaxis and Rash  . Ciprofloxacin     Trouble swallowing    Patient Measurements: Height: 5\' 5"  (165.1 cm) Weight: 136 lb 11 oz (62 kg) IBW/kg (Calculated) : 61.5 Adjusted Body Weight:   Vital Signs: Temp: 98.5 F (36.9 C) (11/06 0401) Temp Source: Axillary (11/06 0401) BP: 134/86 mmHg (11/06 0700) Pulse Rate: 75 (11/06 0700) Intake/Output from previous day: 11/05 0701 - 11/06 0700 In: 3211.7 [P.O.:480; I.V.:2681.7; IV Piggyback:50] Out: 50 [Urine:50] Intake/Output from this shift:    Labs:  Recent Labs  08/03/15 1207 08/04/15 0634 08/04/15 2146 08/04/15 2348 08/05/15 0207  WBC 23.6* 14.4*  --   --  8.8  HGB 14.6 12.2*  --   --  10.2*  PLT 118* 93*  --   --  81*  LABCREA  --   --  <10  --   --   CREATININE 4.44* 6.37*  --  7.36* 7.81*   Estimated Creatinine Clearance: 7.4 mL/min (by C-G formula based on Cr of 7.81). No results for input(s): VANCOTROUGH, VANCOPEAK, VANCORANDOM, GENTTROUGH, GENTPEAK, GENTRANDOM, TOBRATROUGH, TOBRAPEAK, TOBRARND, AMIKACINPEAK, AMIKACINTROU, AMIKACIN in the last 72 hours.   Microbiology: Recent Results (from the past 720 hour(s))  MRSA PCR Screening     Status: None   Collection Time: 08/04/15  9:18 PM  Result Value Ref Range Status   MRSA by PCR NEGATIVE NEGATIVE Final    Comment:        The GeneXpert MRSA Assay (FDA approved for NASAL specimens only), is one component of a comprehensive MRSA colonization surveillance program. It is not intended to diagnose MRSA infection nor to guide or monitor treatment for MRSA infections.     Medical History: Past Medical History  Diagnosis Date  . Arthritis   . MS (multiple sclerosis) ([place])   . Insomnia   . PVD (peripheral vascular disease) ([place])   . Carotid artery stenosis   . TIA (transient ischemic attack)   . Chronic indwelling  Foley catheter 10/06/2011  . Pulmonary fibrosis ([place]) 10/06/2011  . Dysphagia 10/07/2011  . Tremors of nervous system 10/08/2011  . Hypothyroidism 10/08/2011  . Pulmonary nodule 10/08/2011  . C. difficile colitis 09/2011  . Pneumonia   . Encephalopathy   . Urinary tract infection   . Tremors of nervous system   . Hypokalemia   . Junctional rhythm   . Anemia   . Hypernatremia   . Sacral ulcer   . HTN (hypertension), malignant 10/06/2011  . Sleep apnea   . Neuromuscular disorder ([place])   . Back pain, chronic   . Kidney stones   . High grade dysplasia in colonic adenoma 09/2005  . Gait disorder   . OSA (obstructive sleep apnea)   . Paroxysmal atrial tachycardia ([place])   . Dyslipidemia   . Cerebrovascular disease   . CAD (coronary artery disease)   . Peripheral vascular disease ([place])   . Bilateral carotid bruits   . Colon polyps   . HA (headache)     Medications:  Scheduled:  . antiseptic oral rinse  7 mL Mouth Rinse BID  . aspirin  81 mg Oral Daily  . carvedilol  6.25 mg Oral BID WC  . diazepam  5 mg Oral BID  . levothyroxine  88 mcg Oral QAC breakfast  . verapamil  40 mg Oral BID   Assessment: 74yo male admitted to [facility]  11/4 with UTI, decreased urine output after failing outpt treatment.  Pt has a suprapubic catheter.  He has a hx of pseudomonas UTI resistant to Cefepime and is to start Primaxin.  He presents with AKI on CKD3, with probable progression of CKD due to degree of AKI.  Cr 7.81- trending up (4.44 on 11/4), CrCl 7.3ml/min WBC 8.8 (23.6 on 11/4)  Levofloxacin PO 500mg  qday 11/3-11/4 (pta) Levofloxacin IV 500mg  q48  11/4-11/6 ([facility]) Ceftriaxone 1gm q24 11/5-11/6 Primaxin 11/6 >>  11/5  Blood cx x 2:  ntd 11/5  Urine cx:  ntd   Goal of Therapy:  Treatment of infection  Plan:  Primaxin 250mg  IV q12 F/U culture results, watch renal function   Gracy Bruins, PharmD Clinical Pharmacist [facility]

## 2015-08-05 NOTE — H&P (Signed)
Chief Complaint: Patient was seen in consultation today for left hydronephrosis and acute renal failure at the request of urology Dr. Junious Silk   Referring Physician(s): Urology Dr. Junious Silk   History of Present Illness: Jordan Ward is a 74 y.o. male who presented with UTI possible urosepsis and decreased output in suprapubic catheter with concern for dehydration. The patient has a known chronic suprapubic catheter in place, now left ureteropelvic junction calculus and left hydronephrosis with associated acute renal failure. He has been seen by urology and request made for left percutaneous nephrostomy tube placement with temporary hemodialysis catheter placement. He denies any chest pain, shortness of breath or palpitations. He denies any active signs of bleeding or excessive bruising. He does admit to pelvic pain and left flank pain. He has previously tolerated sedation without complications.    Past Medical History  Diagnosis Date  . Arthritis   . MS (multiple sclerosis) ([place])   . Insomnia   . PVD (peripheral vascular disease) ([place])   . Carotid artery stenosis   . TIA (transient ischemic attack)   . Chronic indwelling Foley catheter 10/06/2011  . Pulmonary fibrosis ([place]) 10/06/2011  . Dysphagia 10/07/2011  . Tremors of nervous system 10/08/2011  . Hypothyroidism 10/08/2011  . Pulmonary nodule 10/08/2011  . C. difficile colitis 09/2011  . Pneumonia   . Encephalopathy   . Urinary tract infection   . Tremors of nervous system   . Hypokalemia   . Junctional rhythm   . Anemia   . Hypernatremia   . Sacral ulcer   . HTN (hypertension), malignant 10/06/2011  . Sleep apnea   . Neuromuscular disorder ([place])   . Back pain, chronic   . Kidney stones   . High grade dysplasia in colonic adenoma 09/2005  . Gait disorder   . OSA (obstructive sleep apnea)   . Paroxysmal atrial tachycardia ([place])   . Dyslipidemia   . Cerebrovascular disease   . CAD (coronary artery disease)   . Peripheral  vascular disease ([place])   . Bilateral carotid bruits   . Colon polyps   . HA (headache)     Past Surgical History  Procedure Laterality Date  . Inguinal hernia repair  1971    bilateral  . Back surgery  1976/1979  . Colonoscopy  11/2004    Dr. Sharol Roussel sessile polyp splenic flexure, 40mm sessile polyp desc colon, tubulovillous adenoma (bx not removed)  . Colonoscopy  01/2005    poor prep, polyp could not be found  . Colonoscopy  05/2005    with EMR, polypectomy Dr. Olegario Messier, bx showed high grade dysplasia, partially resected  . Colonoscopy  09/2005    Dr. Arsenio Loader, [place] ink tattooing, four villous colon polyp (3 had been missed on previous colonoscopies due to limitations of procedures  . Colon surgery  09/2005    [place]: four tubular adenomas, large adenomatous polyp with HIGH GRADE dysplasia  . Appendectomy  09/2005    at time of left hemicolectomy  . Colonoscopy  09/2006    normal TI, no polyps  . Colonoscopy  10/2007    Dr. Imogene Burn distal mammillations, benign bx, normal TI, random bx neg for microscopic colitis  . Cholecystectomy      Dr. Tamala Julian  . Suprapubic catheter insertion      Allergies: Tetracyclines & related and Ciprofloxacin  Medications: Prior to Admission medications   Medication Sig Start Date End Date Taking? Authorizing Provider  aspirin 81 MG chewable tablet Chew 1 tablet (  81 mg total) by mouth daily. 07/26/14  Yes Verlee Monte, MD  carvedilol (COREG) 6.25 MG tablet Take 1 tablet (6.25 mg total) by mouth 2 (two) times daily with a meal. Patient taking differently: Take 6.25 mg by mouth daily.  07/26/14  Yes Verlee Monte, MD  diazepam (VALIUM) 5 MG tablet Take 1 tablet (5 mg total) by mouth 2 (two) times daily. 07/18/15  Yes Kathrynn Ducking, MD  HYDROcodone-acetaminophen (Zeba) 7.5-325 MG per tablet Take 1 tablet by mouth 2 (two) times daily. Max APAP 3 GM IN 24 HOURS FROM ALL SOURCES 08/22/14  Yes Mahima Pandey, MD  levofloxacin (LEVAQUIN)  500 MG tablet Take 500 mg by mouth daily. For 14 days(started 08/02/15) 08/02/15  Yes Historical Provider, MD  levothyroxine (SYNTHROID, LEVOTHROID) 88 MCG tablet Take 88 mcg by mouth daily before breakfast.   Yes Historical Provider, MD  potassium chloride SA (K-DUR,KLOR-CON) 20 MEQ tablet Take 20 mEq by mouth daily.   Yes Rexene Alberts, MD  verapamil (CALAN) 40 MG tablet Take 40 mg by mouth 2 (two) times daily.   Yes Historical Provider, MD     Family History  Problem Relation Age of Onset  . Colon cancer Neg Hx   . Cirrhosis Brother     etoh  . Stroke Mother 84  . Coronary artery disease Father 30  . Heart attack Brother   . Multiple sclerosis    . Cancer Sister     Social History   Social History  . Marital Status: Married    Spouse Name: Pricilla Holm  . Number of Children: 2  . Years of Education: 10   Occupational History  . disability   .     Social History Main Topics  . Smoking status: Former Smoker    Types: Cigarettes  . Smokeless tobacco: Never Used     Comment: quit remote  . Alcohol Use: No  . Drug Use: No  . Sexual Activity: Not Asked   Other Topics Concern  . None   Social History Narrative   Patient is married Pricilla Holm) and  Lives w/ wife.   Patient is retired.   Patient has a 10th grade education.   Patient has two children.      Patient drinks about 1-2 sodas daily.   Patient is left handed.     Review of Systems: A 12 point ROS discussed and pertinent positives are indicated in the HPI above.  All other systems are negative.  Review of Systems  Vital Signs: BP 129/82 mmHg  Pulse 72  Temp(Src) 98.7 F (37.1 C) (Oral)  Resp 19  Ht 5\' 5"  (1.651 m)  Wt 136 lb 11 oz (62 kg)  BMI 22.75 kg/m2  SpO2 95%  Physical Exam  Constitutional: He is oriented to person, place, and time. No distress.  HENT:  Head: Normocephalic and atraumatic.  Cardiovascular: Normal rate and regular rhythm.  Exam reveals no gallop and no friction rub.   No murmur  heard. Pulmonary/Chest: Effort normal and breath sounds normal. No respiratory distress. He has no wheezes. He has no rales.  Abdominal: Soft. There is tenderness.  SP catheter in place with small amount of clear yellow urine in bag  Neurological: He is alert and oriented to person, place, and time.  Skin: Skin is warm and dry. He is not diaphoretic.    Mallampati Score:  MD Evaluation Airway: WNL Heart: WNL Abdomen: WNL Chest/ Lungs: WNL ASA  Classification: 3 Mallampati/Airway Score: Two  Imaging: Ct Abdomen Pelvis Wo Contrast  08/04/2015  CLINICAL DATA:  74 year old male with history of acute tubular necrosis and no urine output for the past 3 days. History of suprapubic catheter in place for the past 3 years. EXAM: CT ABDOMEN AND PELVIS WITHOUT CONTRAST TECHNIQUE: Multidetector CT imaging of the abdomen and pelvis was performed following the standard protocol without IV contrast. COMPARISON:  CT the abdomen and pelvis 07/18/2014. FINDINGS: Lower chest: Peripheral ground-glass attenuation and subpleural reticulation in the visualize lung bases, concerning for underlying interstitial lung disease. Small left pleural effusion lying dependently. Calcifications of the aortic valve. Atherosclerotic calcifications in the right coronary artery. Atherosclerotic calcifications in the left circumflex and right coronary arteries. Hepatobiliary: No definite cystic or solid hepatic lesions are identified on today's noncontrast CT examination. Status post cholecystectomy. 16 x 10 x 7 mm partially calcified stone in the distal common bile duct (image 32 of series 2). Although poorly evaluated on today's noncontrast CT examination, there does appear to be ductal dilatation, as the common bile duct measures at least 12 mm in the porta hepatis. Pancreas: No definite pancreatic mass or peripancreatic inflammatory changes on today's noncontrast CT examination. Spleen: Unremarkable. Adrenals/Urinary Tract: 10 mm  calculus at the left ureteropelvic junction associated with moderate to severe proximal hydronephrosis and perinephric stranding. Multiple additional nonobstructive calculi are present within the collecting systems of the kidneys bilaterally, the largest of which is in the lower pole of the left kidney measuring 9 mm. No additional ureteral stones are noted. 9 mm stone in the inferior aspect of the urinary bladder. Right kidney is atrophic. Multiple low-attenuation lesions in the left kidney again noted, incompletely characterized on today's noncontrast CT examination, but similar to the prior study, likely cysts, largest of which measures 2.6 cm extending exophytic the off the lateral aspect of the upper pole of the left kidney. Urinary bladder is nearly completely decompressed with an indwelling suprapubic catheter in position. Bilateral adrenal glands are normal in appearance. Stomach/Bowel: The unenhanced appearance of the stomach is normal. Suture line in the proximal sigmoid colon, and absence of much of the left hemicolon related to prior partial colectomy. Status post appendectomy. Vascular/Lymphatic: Extensive atherosclerosis throughout the abdominal and pelvic vasculature, without evidence of aneurysm. Notably, the distal abdominal aorta is very diminutive in size, as are the proximal common iliac arteries, which could indicate chronic occlusion. Multiple borderline enlarged upper abdominal lymph nodes, largest of which is a high left para-aortic lymph node adjacent to the left renal hilum measuring 11 mm in short axis. This is likely reactive. Reproductive: Prostate gland and seminal vesicles are unremarkable in appearance. Other: Ventral hernia in the lower abdomen containing multiple loops of small bowel, without evidence of incarceration or obstruction at this time. Trace volume of ascites. No pneumoperitoneum. Musculoskeletal: There are no aggressive appearing lytic or blastic lesions noted in the  visualized portions of the skeleton. IMPRESSION: 1. Obstructive 10 mm calculus at the left ureteropelvic junction with with moderate to severe proximal hydronephrosis and perinephric stranding. 2. Multiple additional nonobstructive calculi are present within the collecting systems of the kidneys bilaterally, and in the lumen of the urinary bladder. 3. Large partially calcified stone in the distal common bile duct associated with proximal hepatic ductal dilatation. This appears to be chronic, and is similar to remote prior study 07/18/2014. 4. Trace volume of ascites. 5. Right renal atrophy. 6. Small left pleural effusion lying dependently. 7. Findings in the lung bases suggestive of potential interstitial lung disease.  This could be further evaluated with nonemergent high-resolution chest CT and referral to Pulmonology in the near future if clinically appropriate. 8. Extensive atherosclerosis, including at least 2 vessel coronary artery disease. Additionally, the distal infrarenal abdominal aorta and proximal common iliac arteries are very diminutive in size, which could indicate chronic occlusion or high-grade stenosis. 9. Large ventral hernia in the anterior abdominal [place] containing multiple loops of small bowel, without evidence of incarceration or obstruction at this time. 10. Additional incidental findings, as above. Electronically Signed   By: Vinnie Langton M.D.   On: 08/04/2015 16:20   X-ray Chest Pa And Lateral  08/03/2015  CLINICAL DATA:  74 year old male with fever EXAM: CHEST  2 VIEW COMPARISON:  Prior chest x-ray 07/18/2014 FINDINGS: Cardiac and mediastinal contours are within normal limits no focal airspace consolidation, pulmonary edema, pleural effusion or pneumothorax. Atherosclerotic calcifications are present in the transverse aorta. Mild central bronchitic change remains. No acute osseous abnormality. IMPRESSION: No active cardiopulmonary process. Electronically Signed   By: Jacqulynn Cadet M.D.   On: 08/03/2015 14:07   US Renal  08/04/2015  CLINICAL DATA:  Acute tubular necrosis. EXAM: RENAL / URINARY TRACT ULTRASOUND COMPLETE COMPARISON:  08/07/2014. FINDINGS: Right Kidney: Length: 8.3 cm. Increased echogenicity with progression. Diffuse parenchymal thinning and prominent renal sinus fat, also with progression. 6 mm calculus in the mid kidney. No hydronephrosis. Left Kidney: Length: 13.2 cm. Interval diffusely increased echogenicity. Interval mild to moderate dilatation of the collecting system. 10 mm calculus in the kidney. Bladder: Not distended, with a suprapubic catheter in place. IMPRESSION: 1. Interval mild to moderate left hydronephrosis, most likely due to a nonvisualized ureteral calculus. 2. Bilateral nonobstructing renal calculi. 3. Interval increased echogenicity of both kidneys, compatible with medical renal disease. 4. Progressive right renal atrophy. Electronically Signed   By: Claudie Revering M.D.   On: 08/04/2015 11:14    Labs:  CBC:  Recent Labs  08/03/15 1207 08/04/15 0634 08/05/15 0207  WBC 23.6* 14.4* 8.8  HGB 14.6 12.2* 10.2*  HCT 43.1 37.2* 31.3*  PLT 118* 93* 81*    COAGS:  Recent Labs  08/04/15 1810  INR 1.25  APTT 36    BMP:  Recent Labs  08/03/15 1207 08/04/15 0634 08/04/15 2348 08/05/15 0207  NA 137 137 137 144  K 5.5* 5.8* 4.8 5.3*  CL 102 106 104 111  CO2 24 21* 20* 20*  GLUCOSE 123* 117* 107* 103*  BUN 45* 61* 70* 70*  CALCIUM 9.4 8.4* 8.4* 8.3*  CREATININE 4.44* 6.37* 7.36* 7.81*  GFRNONAA 12* 8* 7* 6*  GFRAA 14* 9* 8* 7*    LIVER FUNCTION TESTS:  Recent Labs  08/03/15 1207 08/05/15 0207  BILITOT 1.1 0.4  AST 22 10*  ALT 15* 9*  ALKPHOS 112 71  PROT 7.7 5.4*  ALBUMIN 3.9 2.3*    Assessment and Plan: History of CVA UTI possible urosepsis with chronic suprapubic catheter in place Now left ureteropelvic junction calculus with left hydronephrosis Acute renal failure Right atrophic kidney  Seen by  urology and request made for left percutaneous nephrostomy tube placement with temporary hemodialysis catheter placement with sedation The patient has been NPO, no blood thinners taken today, possible Lovenox given yesterday morning at [facility], labs and vitals have been reviewed. Risks and Benefits discussed with the patient including, but not limited to infection, bleeding, significant bleeding causing loss or decrease in renal function or damage to adjacent structures.  All of the patient's questions were answered,  patient is agreeable to proceed. Consent signed and in chart. OSA, non-compliant with CPAP    Thank you for this interesting consult.  I greatly enjoyed meeting Jordan Ward and look forward to participating in their care.  A copy of this report was sent to the requesting provider on this date.  SignedHedy Jacob 08/05/2015, 8:37 AM   I spent a total of 20 Minutes in face to face in clinical consultation, greater than 50% of which was counseling/coordinating care for hydronephrosis and acute renal failure.

## 2015-08-05 NOTE — Progress Notes (Signed)
PROGRESS NOTE  Jordan Ward FFM:[id number] DOB: [birth year] DOA: 08/03/2015 PCP: [name], MD   HPI: 74 yo M admitted to [facility] on 11/4 with decreased UOP, abdominal pain, found to be in renal failure due to obstructive stone and left renal hydronephrosis, transferred to [facility] on 11/5 for IR evaluation.  Subjective / 24 H Interval events - endorses left sided abdominal pain this morning, no chest pain / palpitations   Assessment/Plan: Principal Problem:   Hydronephrosis with obstructing calculus Active Problems:   Hyperkalemia   Junctional rhythm   Multiple sclerosis (HCC)   UTI (lower urinary tract infection)   HTN (hypertension), malignant   Chronic indwelling Foley catheter   Pulmonary fibrosis (HCC)   Hypothyroidism   Dilated cardiomyopathy (HCC)   Acute renal failure superimposed on stage 3 chronic kidney disease (HCC)   Chronic systolic congestive heart failure (HCC)   Hydronephrosis with obstructing calculus  - As evidenced by CT-abd/pelvis - IR to place nephrostomy tube, discussed with Dr. Pascal Lux, appreciate input - nephrology and urology following. Discussed with Dr. Jonnie Finner  UTI - has positive UA and leukocytosis. Not septic on admission, prior microbiology with Pseudomonas resistant to Cefepime so will need imipenem - urine culture pending, however not obtained on admission. Received levofloxacin prior to this culture.   Hyperkalemia:  - due to renal failure - received kayexalate. Repeat today   Multiple sclerosis (HCC)  - stable. No taking meds  HTN:  - on Coreg and Verapamil - will d/c both if bp drops  Hypothyroidism - Continue home Synthroid - TSH normal  Chronic systolic congestive heart failure  - Echo on 07/18/14 showed EF of 30-35%. Patient does not have leg edema, CHF is compensated. He is not taking diuretics at home  Acute renal failure superimposed on stage 3 chronic kidney disease (HCC) - due to #1    Diet: Diet NPO time  specified Fluids: none DVT Prophylaxis: SCD  Code Status: Partial Code Family Communication: d/w multiple family members bedside  Disposition Plan: TBD  Consultants:  Urology  Nephrology  IR  Procedures:  Percutaneous nephrostomy 11/6  HD cath 11/6   Antibiotics Primaxin 11/6 >> Levofloxacin 11/4 >>11/5 Ceftriaxone 11/5 >> 11/6   Studies  Ct Abdomen Pelvis Wo Contrast  08/04/2015  CLINICAL DATA:  74 year old male with history of acute tubular necrosis and no urine output for the past 3 days. History of suprapubic catheter in place for the past 3 years. EXAM: CT ABDOMEN AND PELVIS WITHOUT CONTRAST TECHNIQUE: Multidetector CT imaging of the abdomen and pelvis was performed following the standard protocol without IV contrast. COMPARISON:  CT the abdomen and pelvis 07/18/2014. FINDINGS: Lower chest: Peripheral ground-glass attenuation and subpleural reticulation in the visualize lung bases, concerning for underlying interstitial lung disease. Small left pleural effusion lying dependently. Calcifications of the aortic valve. Atherosclerotic calcifications in the right coronary artery. Atherosclerotic calcifications in the left circumflex and right coronary arteries. Hepatobiliary: No definite cystic or solid hepatic lesions are identified on today's noncontrast CT examination. Status post cholecystectomy. 16 x 10 x 7 mm partially calcified stone in the distal common bile duct (image 32 of series 2). Although poorly evaluated on today's noncontrast CT examination, there does appear to be ductal dilatation, as the common bile duct measures at least 12 mm in the porta hepatis. Pancreas: No definite pancreatic mass or peripancreatic inflammatory changes on today's noncontrast CT examination. Spleen: Unremarkable. Adrenals/Urinary Tract: 10 mm calculus at the left ureteropelvic junction associated with moderate to  severe proximal hydronephrosis and perinephric stranding. Multiple additional  nonobstructive calculi are present within the collecting systems of the kidneys bilaterally, the largest of which is in the lower pole of the left kidney measuring 9 mm. No additional ureteral stones are noted. 9 mm stone in the inferior aspect of the urinary bladder. Right kidney is atrophic. Multiple low-attenuation lesions in the left kidney again noted, incompletely characterized on today's noncontrast CT examination, but similar to the prior study, likely cysts, largest of which measures 2.6 cm extending exophytic the off the lateral aspect of the upper pole of the left kidney. Urinary bladder is nearly completely decompressed with an indwelling suprapubic catheter in position. Bilateral adrenal glands are normal in appearance. Stomach/Bowel: The unenhanced appearance of the stomach is normal. Suture line in the proximal sigmoid colon, and absence of much of the left hemicolon related to prior partial colectomy. Status post appendectomy. Vascular/Lymphatic: Extensive atherosclerosis throughout the abdominal and pelvic vasculature, without evidence of aneurysm. Notably, the distal abdominal aorta is very diminutive in size, as are the proximal common iliac arteries, which could indicate chronic occlusion. Multiple borderline enlarged upper abdominal lymph nodes, largest of which is a high left para-aortic lymph node adjacent to the left renal hilum measuring 11 mm in short axis. This is likely reactive. Reproductive: Prostate gland and seminal vesicles are unremarkable in appearance. Other: Ventral hernia in the lower abdomen containing multiple loops of small bowel, without evidence of incarceration or obstruction at this time. Trace volume of ascites. No pneumoperitoneum. Musculoskeletal: There are no aggressive appearing lytic or blastic lesions noted in the visualized portions of the skeleton. IMPRESSION: 1. Obstructive 10 mm calculus at the left ureteropelvic junction with with moderate to severe proximal  hydronephrosis and perinephric stranding. 2. Multiple additional nonobstructive calculi are present within the collecting systems of the kidneys bilaterally, and in the lumen of the urinary bladder. 3. Large partially calcified stone in the distal common bile duct associated with proximal hepatic ductal dilatation. This appears to be chronic, and is similar to remote prior study 07/18/2014. 4. Trace volume of ascites. 5. Right renal atrophy. 6. Small left pleural effusion lying dependently. 7. Findings in the lung bases suggestive of potential interstitial lung disease. This could be further evaluated with nonemergent high-resolution chest CT and referral to Pulmonology in the near future if clinically appropriate. 8. Extensive atherosclerosis, including at least 2 vessel coronary artery disease. Additionally, the distal infrarenal abdominal aorta and proximal common iliac arteries are very diminutive in size, which could indicate chronic occlusion or high-grade stenosis. 9. Large ventral hernia in the anterior abdominal [place] containing multiple loops of small bowel, without evidence of incarceration or obstruction at this time. 10. Additional incidental findings, as above. Electronically Signed   By: Vinnie Langton M.D.   On: 08/04/2015 16:20   X-ray Chest Pa And Lateral  08/03/2015  CLINICAL DATA:  74 year old male with fever EXAM: CHEST  2 VIEW COMPARISON:  Prior chest x-ray 07/18/2014 FINDINGS: Cardiac and mediastinal contours are within normal limits no focal airspace consolidation, pulmonary edema, pleural effusion or pneumothorax. Atherosclerotic calcifications are present in the transverse aorta. Mild central bronchitic change remains. No acute osseous abnormality. IMPRESSION: No active cardiopulmonary process. Electronically Signed   By: Jacqulynn Cadet M.D.   On: 08/03/2015 14:07   US Renal  08/04/2015  CLINICAL DATA:  Acute tubular necrosis. EXAM: RENAL / URINARY TRACT ULTRASOUND COMPLETE  COMPARISON:  08/07/2014. FINDINGS: Right Kidney: Length: 8.3 cm. Increased echogenicity with progression. Diffuse  parenchymal thinning and prominent renal sinus fat, also with progression. 6 mm calculus in the mid kidney. No hydronephrosis. Left Kidney: Length: 13.2 cm. Interval diffusely increased echogenicity. Interval mild to moderate dilatation of the collecting system. 10 mm calculus in the kidney. Bladder: Not distended, with a suprapubic catheter in place. IMPRESSION: 1. Interval mild to moderate left hydronephrosis, most likely due to a nonvisualized ureteral calculus. 2. Bilateral nonobstructing renal calculi. 3. Interval increased echogenicity of both kidneys, compatible with medical renal disease. 4. Progressive right renal atrophy. Electronically Signed   By: Claudie Revering M.D.   On: 08/04/2015 11:14   Ir Fluoro Guide Cv Line Right  08/05/2015  INDICATION: History of chronic suprapubic catheter placement, now with obstructive uropathy and concern for impending urosepsis secondary to an approximately 1 cm stone within the superior aspect of left ureter with associated moderate severe left-sided pelvicaliectasis and worsening renal insufficiency. Request made for placement of a left-sided percutaneous nephrostomy catheter as well as a temporary dialysis catheter. EXAM: NON-TUNNELED CENTRAL VENOUS HEMODIALYSIS CATHETER PLACEMENT WITH ULTRASOUND AND FLUOROSCOPIC GUIDANCE COMPARISON:  None. MEDICATIONS: Versed 1 mg IV; Fentanyl 50 mcg IV; Ancef 2 g IV; the antibiotic was administered with an appropriate time frame prior to the initiation of the procedure. CONTRAST:  None FLUOROSCOPY TIME:  12 seconds (3.8 mGy) COMPLICATIONS: None immediate PROCEDURE: Informed written consent was obtained from the patient after a discussion of the risks, benefits, and alternatives to treatment. Questions regarding the procedure were encouraged and answered. The right neck and chest were prepped with chlorhexidine in a  sterile fashion, and a sterile drape was applied covering the operative field. Maximum barrier sterile technique with sterile gowns and gloves were used for the procedure. A timeout was performed prior to the initiation of the procedure. After creating a small venotomy incision, a micropuncture kit was utilized to access the right internal jugular vein under direct, real-time ultrasound guidance after the overlying soft tissues were anesthetized with 1% lidocaine with epinephrine. Ultrasound image documentation was performed. The microwire was kinked to measure appropriate catheter length. A stiff glidewire was advanced to the level of the IVC. Under fluoroscopic guidance, the venotomy was serially dilated, ultimately allowing placement of a 20 cm temporary Trialysis catheter with tip ultimately terminating within the superior aspect of the right atrium. Final catheter positioning was confirmed and documented with a spot radiographic image. The catheter aspirates and flushes normally. The catheter was flushed with appropriate volume heparin dwells. The catheter exit site was secured with a 0-Prolene retention suture. A dressing was placed. The patient tolerated the procedure well without immediate post procedural complication. IMPRESSION: Successful placement of a right internal jugular approach 20 cm temporary dialysis catheter with tip terminating with in the superior aspect of the right atrium. The catheter is ready for immediate use. PLAN: This catheter may be converted to a tunneled dialysis catheter at a later date as indicated. Electronically Signed   By: Sandi Mariscal M.D.   On: 08/05/2015 11:51   Ir US Guide Vasc Access Right  08/05/2015  INDICATION: History of chronic suprapubic catheter placement, now with obstructive uropathy and concern for impending urosepsis secondary to an approximately 1 cm stone within the superior aspect of left ureter with associated moderate severe left-sided pelvicaliectasis  and worsening renal insufficiency. Request made for placement of a left-sided percutaneous nephrostomy catheter as well as a temporary dialysis catheter. EXAM: NON-TUNNELED CENTRAL VENOUS HEMODIALYSIS CATHETER PLACEMENT WITH ULTRASOUND AND FLUOROSCOPIC GUIDANCE COMPARISON:  None. MEDICATIONS: Versed 1  mg IV; Fentanyl 50 mcg IV; Ancef 2 g IV; the antibiotic was administered with an appropriate time frame prior to the initiation of the procedure. CONTRAST:  None FLUOROSCOPY TIME:  12 seconds (3.8 mGy) COMPLICATIONS: None immediate PROCEDURE: Informed written consent was obtained from the patient after a discussion of the risks, benefits, and alternatives to treatment. Questions regarding the procedure were encouraged and answered. The right neck and chest were prepped with chlorhexidine in a sterile fashion, and a sterile drape was applied covering the operative field. Maximum barrier sterile technique with sterile gowns and gloves were used for the procedure. A timeout was performed prior to the initiation of the procedure. After creating a small venotomy incision, a micropuncture kit was utilized to access the right internal jugular vein under direct, real-time ultrasound guidance after the overlying soft tissues were anesthetized with 1% lidocaine with epinephrine. Ultrasound image documentation was performed. The microwire was kinked to measure appropriate catheter length. A stiff glidewire was advanced to the level of the IVC. Under fluoroscopic guidance, the venotomy was serially dilated, ultimately allowing placement of a 20 cm temporary Trialysis catheter with tip ultimately terminating within the superior aspect of the right atrium. Final catheter positioning was confirmed and documented with a spot radiographic image. The catheter aspirates and flushes normally. The catheter was flushed with appropriate volume heparin dwells. The catheter exit site was secured with a 0-Prolene retention suture. A dressing  was placed. The patient tolerated the procedure well without immediate post procedural complication. IMPRESSION: Successful placement of a right internal jugular approach 20 cm temporary dialysis catheter with tip terminating with in the superior aspect of the right atrium. The catheter is ready for immediate use. PLAN: This catheter may be converted to a tunneled dialysis catheter at a later date as indicated. Electronically Signed   By: Sandi Mariscal M.D.   On: 08/05/2015 11:51   Ir Nephrostomy Placement Left  08/05/2015  INDICATION: History of chronic suprapubic catheter placement, now with obstructive uropathy and concern for impending urosepsis secondary to an approximately 1 cm stone within the superior aspect of left ureter with associated moderate severe left-sided pelvicaliectasis and worsening renal insufficiency. Request made for placement of a left-sided percutaneous nephrostomy catheter as well as a temporary dialysis catheter. EXAM: 1. ULTRASOUND GUIDANCE FOR PUNCTURE OF THE LEFT RENAL COLLECTING SYSTEM 2. LEFT PERCUTANEOUS NEPHROSTOMY TUBE PLACEMENT. COMPARISON:  CT abdomen pelvis - 08/04/2015 MEDICATIONS: Ancef 2 g IV; The antibiotic was administered in an appropriate time frame prior to skin puncture. ANESTHESIA/SEDATION: Fentanyl 100 mcg IV; Versed 2 mg IV Total Moderate Sedation Time 10 minutes. CONTRAST:  10 mL Isovue 300 administered into the collecting system FLUOROSCOPY TIME:  1 minutes 6 seconds (25.9 mGy) COMPLICATIONS: None immediate PROCEDURE: The procedure, risks, benefits, and alternatives were explained to the patient. Questions regarding the procedure were encouraged and answered. The patient understands and consents to the procedure. A timeout was performed prior to the initiation of the procedure. The left flank region was prepped with Betadine in a sterile fashion, and a sterile drape was applied covering the operative field. A sterile gown and sterile gloves were used for the  procedure. A preprocedural spot radiographs obtained of the right mid hemi abdomen. Local anesthesia was provided with 1% Lidocaine with epinephrine. Ultrasound was used to localize the left kidney. Under direct ultrasound guidance, a 21 gauge needle was advanced into the renal collecting system. An ultrasound image documentation was performed. Access within the collecting system was confirmed with  the efflux of urine followed by contrast injection. Over a Nitrex wire, the inner three [place] catheter of an Accustick set was advanced into the renal collecting system. Contrast injection was injected into the collecting system as several spot radiographs were obtained in various obliquities confirming puncture within a posterior inferior calix. As such, the tract was dilated with an Accustick stent. Over a guide wire, a 10-French percutaneous nephrostomy catheter was advanced into the collecting system where the coil was formed and locked. Contrast was injected and several sport radiographs were obtained in various obliquities confirming access. A small sample was aspirated from the nephrostomy, capped and sent to the laboratory for analysis. The catheter was secured at the skin with a Prolene retention suture and a gravity bag was placed. A dressing was placed. The patient tolerated procedure well without immediate postprocedural complication. FINDINGS: Preprocedural spot radiographic image demonstrates a punctate opacity overlying the expected location the superior aspect of the left ureter which correlates with the obstructing ureteral stone seen on preceding abdominal CT. Ultrasound scanning demonstrates a moderate to severely dilated left collecting system. Under direct ultrasound guidance, a posterior inferior calix was targeted allowing advancement of an 10-French percutaneous nephrostomy catheter under intermittent fluoroscopic guidance. Contrast injection confirmed appropriate positioning. A small sample of  aspirated urine was capped and sent to the laboratory for analysis. IMPRESSION: Successful ultrasound and fluoroscopic guided placement of a left sided 10 French PCN. A small sample of aspirated urine was capped and sent to the laboratory for analysis. PLAN: This percutaneous access could be utilized for subsequent percutaneous nephrolithotomy as clinically indicated. Electronically Signed   By: Sandi Mariscal M.D.   On: 08/05/2015 11:49    Objective  Filed Vitals:   08/05/15 1025 08/05/15 1028 08/05/15 1032 08/05/15 1134  BP: 115/72 116/74 118/67 157/84  Pulse: 66 66 66   Temp:      TempSrc:      Resp: '12 17 18   ' Height:      Weight:      SpO2: 95% 93% 95%     Intake/Output Summary (Last 24 hours) at 08/05/15 1235 Last data filed at 08/05/15 1100  Gross per 24 hour  Intake 3211.67 ml  Output    550 ml  Net 2661.67 ml   Filed Weights   08/03/15 1753 08/04/15 0542 08/05/15 0401  Weight: 56.518 kg (124 lb 9.6 oz) 57.561 kg (126 lb 14.4 oz) 62 kg (136 lb 11 oz)    Exam:  GENERAL: NAD  HEENT: head NCAT, no scleral icterus.   NECK: Supple.   LUNGS: Clear to auscultation. No wheezing or crackles  HEART: Regular rate and rhythm without murmur. 2+ pulses, no JVD, no peripheral edema  ABDOMEN: Soft, non-distended, tender to palpation left upper and lower quadrants  EXTREMITIES: Without any cyanosis or clubbing. Good muscle tone  NEUROLOGIC: Alert and oriented x3. Cranial nerves II through XII are grossly intact. Strength 5/5 in all 4.  Data Reviewed: Basic Metabolic Panel:  Recent Labs Lab 08/03/15 1207 08/04/15 0634 08/04/15 2348 08/05/15 0207  NA 137 137 137 144  K 5.5* 5.8* 4.8 5.3*  CL 102 106 104 111  CO2 24 21* 20* 20*  GLUCOSE 123* 117* 107* 103*  BUN 45* 61* 70* 70*  CREATININE 4.44* 6.37* 7.36* 7.81*  CALCIUM 9.4 8.4* 8.4* 8.3*   Liver Function Tests:  Recent Labs Lab 08/03/15 1207 08/05/15 0207  AST 22 10*  ALT 15* 9*  ALKPHOS 112 71  BILITOT  1.1 0.4  PROT 7.7 5.4*  ALBUMIN 3.9 2.3*   CBC:  Recent Labs Lab 08/03/15 1207 08/04/15 0634 08/05/15 0207  WBC 23.6* 14.4* 8.8  NEUTROABS 20.0*  --   --   HGB 14.6 12.2* 10.2*  HCT 43.1 37.2* 31.3*  MCV 91.3 92.5 91.5  PLT 118* 93* 81*   BNP (last 3 results)  Recent Labs  08/05/15 0207  BNP 1190.1*   Recent Results (from the past 240 hour(s))  MRSA PCR Screening     Status: None   Collection Time: 08/04/15  9:18 PM  Result Value Ref Range Status   MRSA by PCR NEGATIVE NEGATIVE Final    Comment:        The GeneXpert MRSA Assay (FDA approved for NASAL specimens only), is one component of a comprehensive MRSA colonization surveillance program. It is not intended to diagnose MRSA infection nor to guide or monitor treatment for MRSA infections.   Urine culture     Status: None (Preliminary result)   Collection Time: 08/04/15  9:45 PM  Result Value Ref Range Status   Specimen Description URINE, CLEAN CATCH  Final   Special Requests cipro  Final   Culture TOO YOUNG TO READ  Final   Report Status PENDING  Incomplete     Scheduled Meds: . antiseptic oral rinse  7 mL Mouth Rinse BID  . aspirin  81 mg Oral Daily  . carvedilol  6.25 mg Oral BID WC  . ceFAZolin      . diazepam  5 mg Oral BID  . fentaNYL      . heparin      . imipenem-cilastatin  250 mg Intravenous Q12H  . levothyroxine  88 mcg Oral QAC breakfast  . lidocaine      . lidocaine-EPINEPHrine      . midazolam      . verapamil  40 mg Oral BID   Continuous Infusions: . sodium chloride 75 mL/hr at 08/04/15 2338    Marzetta Board, MD [facility] Pager [phone]. If 7 PM - 7 AM, please contact night-coverage at [email or web address], password [facility] 08/05/2015, 12:35 PM  LOS: 2 days

## 2015-08-05 NOTE — Sedation Documentation (Signed)
Nephrostomy tube in- will place temp cath now- will reposition pt on back for 2nd part

## 2015-08-05 NOTE — Sedation Documentation (Signed)
Successful temp cath and L nephrostomy placed

## 2015-08-06 DIAGNOSIS — Z992 Dependence on renal dialysis: Secondary | ICD-10-CM | POA: Insufficient documentation

## 2015-08-06 DIAGNOSIS — I10 Essential (primary) hypertension: Secondary | ICD-10-CM

## 2015-08-06 LAB — URINE CULTURE
Culture: NO GROWTH
Special Requests: NORMAL

## 2015-08-06 LAB — COMPREHENSIVE METABOLIC PANEL
ALBUMIN: 2.4 g/dL — AB (ref 3.5–5.0)
ALT: 10 U/L — AB (ref 17–63)
AST: 17 U/L (ref 15–41)
Alkaline Phosphatase: 69 U/L (ref 38–126)
Anion gap: 9 (ref 5–15)
BUN: 52 mg/dL — AB (ref 6–20)
CHLORIDE: 118 mmol/L — AB (ref 101–111)
CO2: 18 mmol/L — AB (ref 22–32)
CREATININE: 4.73 mg/dL — AB (ref 0.61–1.24)
Calcium: 8 mg/dL — ABNORMAL LOW (ref 8.9–10.3)
GFR calc Af Amer: 13 mL/min — ABNORMAL LOW (ref >60)
GFR calc non Af Amer: 11 mL/min — ABNORMAL LOW (ref >60)
GLUCOSE: 104 mg/dL — AB (ref 65–99)
Potassium: 4.5 mmol/L (ref 3.5–5.1)
Sodium: 145 mmol/L (ref 135–145)
Total Bilirubin: 0.7 mg/dL (ref 0.3–1.2)
Total Protein: 5.6 g/dL — ABNORMAL LOW (ref 6.5–8.1)

## 2015-08-06 LAB — CBC
HCT: 33.5 % — ABNORMAL LOW (ref 39.0–52.0)
Hemoglobin: 11 g/dL — ABNORMAL LOW (ref 13.0–17.0)
MCH: 30.7 pg (ref 26.0–34.0)
MCHC: 32.8 g/dL (ref 30.0–36.0)
MCV: 93.6 fL (ref 78.0–100.0)
PLATELETS: 80 10*3/uL — AB (ref 150–400)
RBC: 3.58 MIL/uL — AB (ref 4.22–5.81)
RDW: 14.8 % (ref 11.5–15.5)
WBC: 7.9 10*3/uL (ref 4.0–10.5)

## 2015-08-06 MED ORDER — VERAPAMIL HCL 40 MG PO TABS
40.0000 mg | ORAL_TABLET | Freq: Two times a day (BID) | ORAL | Status: DC
  Administered 2015-08-06 – 2015-08-09 (×7): 40 mg via ORAL
  Filled 2015-08-06 (×9): qty 1

## 2015-08-06 NOTE — Progress Notes (Signed)
Pharmacist Heart Failure Core Measure Documentation  Assessment: Jordan Ward has an EF documented as 30-35% on 2015 by Echo.  Rationale: Heart failure patients with left ventricular systolic dysfunction (LVSD) and an EF < 40% should be prescribed an angiotensin converting enzyme inhibitor (ACEI) or angiotensin receptor blocker (ARB) at discharge unless a contraindication is documented in the medical record.  This patient is not currently on an ACEI or ARB for HF.  This note is being placed in the record in order to provide documentation that a contraindication to the use of these agents is present for this encounter.  ACE Inhibitor or Angiotensin Receptor Blocker is contraindicated (specify all that apply)  []   ACEI allergy AND ARB allergy []   Angioedema []   Moderate or severe aortic stenosis [x]   Hyperkalemia []   Hypotension []   Renal artery stenosis [x]   Worsening renal function, preexisting renal disease or dysfunction    Jordan Ward, PharmD, BCPS Clinical Staff Pharmacist Pager [phone]  Jordan Ward [facility] 08/06/2015 11:35 AM

## 2015-08-06 NOTE — Progress Notes (Signed)
PROGRESS NOTE  Jordan Ward FXT:[id number] DOB: [birth year] DOA: 08/03/2015 PCP: [name], MD   HPI: 74 yo M admitted to [facility] on 11/4 with decreased UOP, abdominal pain, found to be in renal failure due to obstructive stone and left renal hydronephrosis, transferred to [facility] on 11/5 for IR evaluation.  Subjective / 24 H Interval events - pain improved, he wants to eat regular foods without restrictions - good UOP overnight  Assessment/Plan: Principal Problem:   Hydronephrosis with obstructing calculus Active Problems:   Hyperkalemia   Junctional rhythm   Multiple sclerosis (HCC)   UTI (lower urinary tract infection)   HTN (hypertension), malignant   Chronic indwelling Foley catheter   Pulmonary fibrosis (HCC)   Hypothyroidism   Dilated cardiomyopathy (HCC)   Acute renal failure superimposed on stage 3 chronic kidney disease (HCC)   Chronic systolic congestive heart failure (HCC)   Hydronephrosis with obstructing calculus  - As evidenced by CT-abd/pelvis - s/p percutaneous nephrostomy tube 11/6 - nephrology and urology following.  - significant UOP overnight, monitor for post obstructive diuresis   UTI - has positive UA and leukocytosis. Not septic on admission, prior microbiology with Pseudomonas resistant to Cefepime so will need imipenem - urine culture pending. Received levofloxacin prior to this culture.  - clinically he responded to Levofloxacin, if cultures remain negative will narrow to that  Hyperkalemia:  - due to renal failure - K normal this morning - continue to monitor  Multiple sclerosis (HCC)  - stable. No taking meds  HTN:  - on Coreg and Verapamil, Verapamil discontinued yesterday given hypotension post procedure - BP 160-170s, restart Verapamil today   Hypothyroidism - Continue home Synthroid - TSH normal  Chronic systolic congestive heart failure  - Echo on 07/18/14 showed EF of 30-35%. Patient does not have leg edema, CHF is  compensated. He is not taking diuretics at home - watch fluid status closely as currently is on normal saline 125 cc/h to help with post obstructive diuresis. Fluid management per renal.  Acute renal failure superimposed on stage 3 chronic kidney disease (HCC) - due to #1 - improving post nephrostomy tube placement  Nephrolithiasis - per urology     Diet: Diet renal with fluid restriction Room service appropriate?: Yes; Fluid consistency:: Nectar Thick Fluids: none DVT Prophylaxis: SCD  Code Status: Partial Code Family Communication: no family bedside  Disposition Plan: transfer to telemetry   Consultants:  Urology  Nephrology  IR  Procedures:  Percutaneous nephrostomy 11/6  HD cath 11/6   Antibiotics Primaxin 11/6 >> Levofloxacin 11/4 >>11/5 Ceftriaxone 11/5 >> 11/6   Studies  Ct Abdomen Pelvis Wo Contrast  08/04/2015  CLINICAL DATA:  74 year old male with history of acute tubular necrosis and no urine output for the past 3 days. History of suprapubic catheter in place for the past 3 years. EXAM: CT ABDOMEN AND PELVIS WITHOUT CONTRAST TECHNIQUE: Multidetector CT imaging of the abdomen and pelvis was performed following the standard protocol without IV contrast. COMPARISON:  CT the abdomen and pelvis 07/18/2014. FINDINGS: Lower chest: Peripheral ground-glass attenuation and subpleural reticulation in the visualize lung bases, concerning for underlying interstitial lung disease. Small left pleural effusion lying dependently. Calcifications of the aortic valve. Atherosclerotic calcifications in the right coronary artery. Atherosclerotic calcifications in the left circumflex and right coronary arteries. Hepatobiliary: No definite cystic or solid hepatic lesions are identified on today's noncontrast CT examination. Status post cholecystectomy. 16 x 10 x 7 mm partially calcified stone in the distal common  bile duct (image 32 of series 2). Although poorly evaluated on today's  noncontrast CT examination, there does appear to be ductal dilatation, as the common bile duct measures at least 12 mm in the porta hepatis. Pancreas: No definite pancreatic mass or peripancreatic inflammatory changes on today's noncontrast CT examination. Spleen: Unremarkable. Adrenals/Urinary Tract: 10 mm calculus at the left ureteropelvic junction associated with moderate to severe proximal hydronephrosis and perinephric stranding. Multiple additional nonobstructive calculi are present within the collecting systems of the kidneys bilaterally, the largest of which is in the lower pole of the left kidney measuring 9 mm. No additional ureteral stones are noted. 9 mm stone in the inferior aspect of the urinary bladder. Right kidney is atrophic. Multiple low-attenuation lesions in the left kidney again noted, incompletely characterized on today's noncontrast CT examination, but similar to the prior study, likely cysts, largest of which measures 2.6 cm extending exophytic the off the lateral aspect of the upper pole of the left kidney. Urinary bladder is nearly completely decompressed with an indwelling suprapubic catheter in position. Bilateral adrenal glands are normal in appearance. Stomach/Bowel: The unenhanced appearance of the stomach is normal. Suture line in the proximal sigmoid colon, and absence of much of the left hemicolon related to prior partial colectomy. Status post appendectomy. Vascular/Lymphatic: Extensive atherosclerosis throughout the abdominal and pelvic vasculature, without evidence of aneurysm. Notably, the distal abdominal aorta is very diminutive in size, as are the proximal common iliac arteries, which could indicate chronic occlusion. Multiple borderline enlarged upper abdominal lymph nodes, largest of which is a high left para-aortic lymph node adjacent to the left renal hilum measuring 11 mm in short axis. This is likely reactive. Reproductive: Prostate gland and seminal vesicles are  unremarkable in appearance. Other: Ventral hernia in the lower abdomen containing multiple loops of small bowel, without evidence of incarceration or obstruction at this time. Trace volume of ascites. No pneumoperitoneum. Musculoskeletal: There are no aggressive appearing lytic or blastic lesions noted in the visualized portions of the skeleton. IMPRESSION: 1. Obstructive 10 mm calculus at the left ureteropelvic junction with with moderate to severe proximal hydronephrosis and perinephric stranding. 2. Multiple additional nonobstructive calculi are present within the collecting systems of the kidneys bilaterally, and in the lumen of the urinary bladder. 3. Large partially calcified stone in the distal common bile duct associated with proximal hepatic ductal dilatation. This appears to be chronic, and is similar to remote prior study 07/18/2014. 4. Trace volume of ascites. 5. Right renal atrophy. 6. Small left pleural effusion lying dependently. 7. Findings in the lung bases suggestive of potential interstitial lung disease. This could be further evaluated with nonemergent high-resolution chest CT and referral to Pulmonology in the near future if clinically appropriate. 8. Extensive atherosclerosis, including at least 2 vessel coronary artery disease. Additionally, the distal infrarenal abdominal aorta and proximal common iliac arteries are very diminutive in size, which could indicate chronic occlusion or high-grade stenosis. 9. Large ventral hernia in the anterior abdominal Mode containing multiple loops of small bowel, without evidence of incarceration or obstruction at this time. 10. Additional incidental findings, as above. Electronically Signed   By: Vinnie Langton M.D.   On: 08/04/2015 16:20   US Renal  08/04/2015  CLINICAL DATA:  Acute tubular necrosis. EXAM: RENAL / URINARY TRACT ULTRASOUND COMPLETE COMPARISON:  08/07/2014. FINDINGS: Right Kidney: Length: 8.3 cm. Increased echogenicity with progression.  Diffuse parenchymal thinning and prominent renal sinus fat, also with progression. 6 mm calculus in the mid kidney.  No hydronephrosis. Left Kidney: Length: 13.2 cm. Interval diffusely increased echogenicity. Interval mild to moderate dilatation of the collecting system. 10 mm calculus in the kidney. Bladder: Not distended, with a suprapubic catheter in place. IMPRESSION: 1. Interval mild to moderate left hydronephrosis, most likely due to a nonvisualized ureteral calculus. 2. Bilateral nonobstructing renal calculi. 3. Interval increased echogenicity of both kidneys, compatible with medical renal disease. 4. Progressive right renal atrophy. Electronically Signed   By: Claudie Revering M.D.   On: 08/04/2015 11:14   Ir Fluoro Guide Cv Line Right  08/05/2015  INDICATION: History of chronic suprapubic catheter placement, now with obstructive uropathy and concern for impending urosepsis secondary to an approximately 1 cm stone within the superior aspect of left ureter with associated moderate severe left-sided pelvicaliectasis and worsening renal insufficiency. Request made for placement of a left-sided percutaneous nephrostomy catheter as well as a temporary dialysis catheter. EXAM: NON-TUNNELED CENTRAL VENOUS HEMODIALYSIS CATHETER PLACEMENT WITH ULTRASOUND AND FLUOROSCOPIC GUIDANCE COMPARISON:  None. MEDICATIONS: Versed 1 mg IV; Fentanyl 50 mcg IV; Ancef 2 g IV; the antibiotic was administered with an appropriate time frame prior to the initiation of the procedure. CONTRAST:  None FLUOROSCOPY TIME:  12 seconds (3.8 mGy) COMPLICATIONS: None immediate PROCEDURE: Informed written consent was obtained from the patient after a discussion of the risks, benefits, and alternatives to treatment. Questions regarding the procedure were encouraged and answered. The right neck and chest were prepped with chlorhexidine in a sterile fashion, and a sterile drape was applied covering the operative field. Maximum barrier sterile technique  with sterile gowns and gloves were used for the procedure. A timeout was performed prior to the initiation of the procedure. After creating a small venotomy incision, a micropuncture kit was utilized to access the right internal jugular vein under direct, real-time ultrasound guidance after the overlying soft tissues were anesthetized with 1% lidocaine with epinephrine. Ultrasound image documentation was performed. The microwire was kinked to measure appropriate catheter length. A stiff glidewire was advanced to the level of the IVC. Under fluoroscopic guidance, the venotomy was serially dilated, ultimately allowing placement of a 20 cm temporary Trialysis catheter with tip ultimately terminating within the superior aspect of the right atrium. Final catheter positioning was confirmed and documented with a spot radiographic image. The catheter aspirates and flushes normally. The catheter was flushed with appropriate volume heparin dwells. The catheter exit site was secured with a 0-Prolene retention suture. A dressing was placed. The patient tolerated the procedure well without immediate post procedural complication. IMPRESSION: Successful placement of a right internal jugular approach 20 cm temporary dialysis catheter with tip terminating with in the superior aspect of the right atrium. The catheter is ready for immediate use. PLAN: This catheter may be converted to a tunneled dialysis catheter at a later date as indicated. Electronically Signed   By: Sandi Mariscal M.D.   On: 08/05/2015 11:51   Ir US Guide Vasc Access Right  08/05/2015  INDICATION: History of chronic suprapubic catheter placement, now with obstructive uropathy and concern for impending urosepsis secondary to an approximately 1 cm stone within the superior aspect of left ureter with associated moderate severe left-sided pelvicaliectasis and worsening renal insufficiency. Request made for placement of a left-sided percutaneous nephrostomy catheter as  well as a temporary dialysis catheter. EXAM: NON-TUNNELED CENTRAL VENOUS HEMODIALYSIS CATHETER PLACEMENT WITH ULTRASOUND AND FLUOROSCOPIC GUIDANCE COMPARISON:  None. MEDICATIONS: Versed 1 mg IV; Fentanyl 50 mcg IV; Ancef 2 g IV; the antibiotic was administered with an appropriate  time frame prior to the initiation of the procedure. CONTRAST:  None FLUOROSCOPY TIME:  12 seconds (3.8 mGy) COMPLICATIONS: None immediate PROCEDURE: Informed written consent was obtained from the patient after a discussion of the risks, benefits, and alternatives to treatment. Questions regarding the procedure were encouraged and answered. The right neck and chest were prepped with chlorhexidine in a sterile fashion, and a sterile drape was applied covering the operative field. Maximum barrier sterile technique with sterile gowns and gloves were used for the procedure. A timeout was performed prior to the initiation of the procedure. After creating a small venotomy incision, a micropuncture kit was utilized to access the right internal jugular vein under direct, real-time ultrasound guidance after the overlying soft tissues were anesthetized with 1% lidocaine with epinephrine. Ultrasound image documentation was performed. The microwire was kinked to measure appropriate catheter length. A stiff glidewire was advanced to the level of the IVC. Under fluoroscopic guidance, the venotomy was serially dilated, ultimately allowing placement of a 20 cm temporary Trialysis catheter with tip ultimately terminating within the superior aspect of the right atrium. Final catheter positioning was confirmed and documented with a spot radiographic image. The catheter aspirates and flushes normally. The catheter was flushed with appropriate volume heparin dwells. The catheter exit site was secured with a 0-Prolene retention suture. A dressing was placed. The patient tolerated the procedure well without immediate post procedural complication. IMPRESSION:  Successful placement of a right internal jugular approach 20 cm temporary dialysis catheter with tip terminating with in the superior aspect of the right atrium. The catheter is ready for immediate use. PLAN: This catheter may be converted to a tunneled dialysis catheter at a later date as indicated. Electronically Signed   By: Sandi Mariscal M.D.   On: 08/05/2015 11:51   Ir Nephrostomy Placement Left  08/05/2015  INDICATION: History of chronic suprapubic catheter placement, now with obstructive uropathy and concern for impending urosepsis secondary to an approximately 1 cm stone within the superior aspect of left ureter with associated moderate severe left-sided pelvicaliectasis and worsening renal insufficiency. Request made for placement of a left-sided percutaneous nephrostomy catheter as well as a temporary dialysis catheter. EXAM: 1. ULTRASOUND GUIDANCE FOR PUNCTURE OF THE LEFT RENAL COLLECTING SYSTEM 2. LEFT PERCUTANEOUS NEPHROSTOMY TUBE PLACEMENT. COMPARISON:  CT abdomen pelvis - 08/04/2015 MEDICATIONS: Ancef 2 g IV; The antibiotic was administered in an appropriate time frame prior to skin puncture. ANESTHESIA/SEDATION: Fentanyl 100 mcg IV; Versed 2 mg IV Total Moderate Sedation Time 10 minutes. CONTRAST:  10 mL Isovue 300 administered into the collecting system FLUOROSCOPY TIME:  1 minutes 6 seconds (73.4 mGy) COMPLICATIONS: None immediate PROCEDURE: The procedure, risks, benefits, and alternatives were explained to the patient. Questions regarding the procedure were encouraged and answered. The patient understands and consents to the procedure. A timeout was performed prior to the initiation of the procedure. The left flank region was prepped with Betadine in a sterile fashion, and a sterile drape was applied covering the operative field. A sterile gown and sterile gloves were used for the procedure. A preprocedural spot radiographs obtained of the right mid hemi abdomen. Local anesthesia was provided  with 1% Lidocaine with epinephrine. Ultrasound was used to localize the left kidney. Under direct ultrasound guidance, a 21 gauge needle was advanced into the renal collecting system. An ultrasound image documentation was performed. Access within the collecting system was confirmed with the efflux of urine followed by contrast injection. Over a Nitrex wire, the inner three [place] catheter  of an [place] set was advanced into the renal collecting system. Contrast injection was injected into the collecting system as several spot radiographs were obtained in various obliquities confirming puncture within a posterior inferior calix. As such, the tract was dilated with an Accustick stent. Over a guide wire, a 10-French percutaneous nephrostomy catheter was advanced into the collecting system where the coil was formed and locked. Contrast was injected and several sport radiographs were obtained in various obliquities confirming access. A small sample was aspirated from the nephrostomy, capped and sent to the laboratory for analysis. The catheter was secured at the skin with a Prolene retention suture and a gravity bag was placed. A dressing was placed. The patient tolerated procedure well without immediate postprocedural complication. FINDINGS: Preprocedural spot radiographic image demonstrates a punctate opacity overlying the expected location the superior aspect of the left ureter which correlates with the obstructing ureteral stone seen on preceding abdominal CT. Ultrasound scanning demonstrates a moderate to severely dilated left collecting system. Under direct ultrasound guidance, a posterior inferior calix was targeted allowing advancement of an 10-French percutaneous nephrostomy catheter under intermittent fluoroscopic guidance. Contrast injection confirmed appropriate positioning. A small sample of aspirated urine was capped and sent to the laboratory for analysis. IMPRESSION: Successful ultrasound and  fluoroscopic guided placement of a left sided 10 French PCN. A small sample of aspirated urine was capped and sent to the laboratory for analysis. PLAN: This percutaneous access could be utilized for subsequent percutaneous nephrolithotomy as clinically indicated. Electronically Signed   By: Sandi Mariscal M.D.   On: 08/05/2015 11:49    Objective  Filed Vitals:   08/05/15 2100 08/06/15 0015 08/06/15 0441 08/06/15 0500  BP: 128/78 135/78 119/75   Pulse: 71 68 72   Temp: 98 F (36.7 C) 98.3 F (36.8 C) 98.5 F (36.9 C)   TempSrc:  Axillary Oral   Resp: '22 15 14   ' Height:      Weight:    61.9 kg (136 lb 7.4 oz)  SpO2: 95% 99% 100%     Intake/Output Summary (Last 24 hours) at 08/06/15 0646 Last data filed at 08/06/15 0616  Gross per 24 hour  Intake 2904.17 ml  Output   6750 ml  Net -3845.83 ml   Filed Weights   08/04/15 0542 08/05/15 0401 08/06/15 0500  Weight: 57.561 kg (126 lb 14.4 oz) 62 kg (136 lb 11 oz) 61.9 kg (136 lb 7.4 oz)    Exam:  GENERAL: NAD  HEENT: head NCAT, no scleral icterus.   NECK: Supple.   LUNGS: Clear to auscultation. No wheezing or crackles  HEART: Regular rate and rhythm without murmur. 2+ pulses, no JVD, no peripheral edema  ABDOMEN: Soft, non-distended, tender to palpation left upper and lower quadrants  EXTREMITIES: Without any cyanosis or clubbing. Good muscle tone  NEUROLOGIC: Alert and oriented x3. Cranial nerves II through XII are grossly intact. Strength 5/5 in all 4.  Data Reviewed: Basic Metabolic Panel:  Recent Labs Lab 08/03/15 1207 08/04/15 0634 08/04/15 2348 08/05/15 0207 08/06/15 0319  NA 137 137 137 144 145  K 5.5* 5.8* 4.8 5.3* 4.5  CL 102 106 104 111 118*  CO2 24 21* 20* 20* 18*  GLUCOSE 123* 117* 107* 103* 104*  BUN 45* 61* 70* 70* 52*  CREATININE 4.44* 6.37* 7.36* 7.81* 4.73*  CALCIUM 9.4 8.4* 8.4* 8.3* 8.0*   Liver Function Tests:  Recent Labs Lab 08/03/15 1207 08/05/15 0207 08/06/15 0319  AST 22 10* 17  ALT 15* 9* 10*  ALKPHOS 112 71 69  BILITOT 1.1 0.4 0.7  PROT 7.7 5.4* 5.6*  ALBUMIN 3.9 2.3* 2.4*   CBC:  Recent Labs Lab 08/03/15 1207 08/04/15 0634 08/05/15 0207 08/06/15 0319  WBC 23.6* 14.4* 8.8 7.9  NEUTROABS 20.0*  --   --   --   HGB 14.6 12.2* 10.2* 11.0*  HCT 43.1 37.2* 31.3* 33.5*  MCV 91.3 92.5 91.5 93.6  PLT 118* 93* 81* 80*   BNP (last 3 results)  Recent Labs  08/05/15 0207  BNP 1190.1*   Recent Results (from the past 240 hour(s))  MRSA PCR Screening     Status: None   Collection Time: 08/04/15  9:18 PM  Result Value Ref Range Status   MRSA by PCR NEGATIVE NEGATIVE Final    Comment:        The GeneXpert MRSA Assay (FDA approved for NASAL specimens only), is one component of a comprehensive MRSA colonization surveillance program. It is not intended to diagnose MRSA infection nor to guide or monitor treatment for MRSA infections.   Urine culture     Status: None (Preliminary result)   Collection Time: 08/04/15  9:45 PM  Result Value Ref Range Status   Specimen Description URINE, CLEAN CATCH  Final   Special Requests cipro  Final   Culture TOO YOUNG TO READ  Final   Report Status PENDING  Incomplete     Scheduled Meds: . antiseptic oral rinse  7 mL Mouth Rinse BID  . aspirin  81 mg Oral Daily  . carvedilol  6.25 mg Oral BID WC  . diazepam  5 mg Oral BID  . imipenem-cilastatin  250 mg Intravenous Q12H  . levothyroxine  88 mcg Oral QAC breakfast   Continuous Infusions: . sodium chloride 125 mL/hr at 08/06/15 6484    Marzetta Board, MD [facility] Pager [phone]. If 7 PM - 7 AM, please contact night-coverage at [email or web address], password [facility] 08/06/2015, 6:46 AM  LOS: 3 days

## 2015-08-06 NOTE — Progress Notes (Signed)
Referring Physician(s): [place]  Chief Complaint:  L hydronephrosis Ureteropelvic stone  Subjective:  L PCN placed 11/6 Bun/Cr down Output great Yellow urine afeb Feeling great  Allergies: Tetracyclines & related and Ciprofloxacin  Medications: Prior to Admission medications   Medication Sig Start Date End Date Taking? Authorizing Provider  aspirin 81 MG chewable tablet Chew 1 tablet (81 mg total) by mouth daily. 07/26/14  Yes Verlee Monte, MD  carvedilol (COREG) 6.25 MG tablet Take 1 tablet (6.25 mg total) by mouth 2 (two) times daily with a meal. Patient taking differently: Take 6.25 mg by mouth daily.  07/26/14  Yes Verlee Monte, MD  diazepam (VALIUM) 5 MG tablet Take 1 tablet (5 mg total) by mouth 2 (two) times daily. 07/18/15  Yes Kathrynn Ducking, MD  HYDROcodone-acetaminophen ([place]) 7.5-325 MG per tablet Take 1 tablet by mouth 2 (two) times daily. Max APAP 3 GM IN 24 HOURS FROM ALL SOURCES 08/22/14  Yes Mahima Pandey, MD  levofloxacin (LEVAQUIN) 500 MG tablet Take 500 mg by mouth daily. For 14 days(started 08/02/15) 08/02/15  Yes Historical Provider, MD  levothyroxine (SYNTHROID, LEVOTHROID) 88 MCG tablet Take 88 mcg by mouth daily before breakfast.   Yes Historical Provider, MD  potassium chloride SA (K-DUR,KLOR-CON) 20 MEQ tablet Take 20 mEq by mouth daily.   Yes Rexene Alberts, MD  verapamil (CALAN) 40 MG tablet Take 40 mg by mouth 2 (two) times daily.   Yes Historical Provider, MD     Vital Signs: BP 172/90 mmHg  Pulse 75  Temp(Src) 98.4 F (36.9 C) (Oral)  Resp 19  Ht '5\' 5"'  (1.651 m)  Wt 136 lb 7.4 oz (61.9 kg)  BMI 22.71 kg/m2  SpO2 100%  Physical Exam  Abdominal: Soft. Bowel sounds are normal.  Skin: Skin is warm and dry.  Left flank site sl tender Clean and dry No bleeding OUP 6.7 L yesterday Full now (1L) Clear yellow afeb  Nursing note and vitals reviewed.   Imaging: Ct Abdomen Pelvis Wo Contrast  08/04/2015  CLINICAL DATA:   74 year old male with history of acute tubular necrosis and no urine output for the past 3 days. History of suprapubic catheter in place for the past 3 years. EXAM: CT ABDOMEN AND PELVIS WITHOUT CONTRAST TECHNIQUE: Multidetector CT imaging of the abdomen and pelvis was performed following the standard protocol without IV contrast. COMPARISON:  CT the abdomen and pelvis 07/18/2014. FINDINGS: Lower chest: Peripheral ground-glass attenuation and subpleural reticulation in the visualize lung bases, concerning for underlying interstitial lung disease. Small left pleural effusion lying dependently. Calcifications of the aortic valve. Atherosclerotic calcifications in the right coronary artery. Atherosclerotic calcifications in the left circumflex and right coronary arteries. Hepatobiliary: No definite cystic or solid hepatic lesions are identified on today's noncontrast CT examination. Status post cholecystectomy. 16 x 10 x 7 mm partially calcified stone in the distal common bile duct (image 32 of series 2). Although poorly evaluated on today's noncontrast CT examination, there does appear to be ductal dilatation, as the common bile duct measures at least 12 mm in the porta hepatis. Pancreas: No definite pancreatic mass or peripancreatic inflammatory changes on today's noncontrast CT examination. Spleen: Unremarkable. Adrenals/Urinary Tract: 10 mm calculus at the left ureteropelvic junction associated with moderate to severe proximal hydronephrosis and perinephric stranding. Multiple additional nonobstructive calculi are present within the collecting systems of the kidneys bilaterally, the largest of which is in the lower pole of the left kidney measuring 9 mm. No additional ureteral stones  are noted. 9 mm stone in the inferior aspect of the urinary bladder. Right kidney is atrophic. Multiple low-attenuation lesions in the left kidney again noted, incompletely characterized on today's noncontrast CT examination, but  similar to the prior study, likely cysts, largest of which measures 2.6 cm extending exophytic the off the lateral aspect of the upper pole of the left kidney. Urinary bladder is nearly completely decompressed with an indwelling suprapubic catheter in position. Bilateral adrenal glands are normal in appearance. Stomach/Bowel: The unenhanced appearance of the stomach is normal. Suture line in the proximal sigmoid colon, and absence of much of the left hemicolon related to prior partial colectomy. Status post appendectomy. Vascular/Lymphatic: Extensive atherosclerosis throughout the abdominal and pelvic vasculature, without evidence of aneurysm. Notably, the distal abdominal aorta is very diminutive in size, as are the proximal common iliac arteries, which could indicate chronic occlusion. Multiple borderline enlarged upper abdominal lymph nodes, largest of which is a high left para-aortic lymph node adjacent to the left renal hilum measuring 11 mm in short axis. This is likely reactive. Reproductive: Prostate gland and seminal vesicles are unremarkable in appearance. Other: Ventral hernia in the lower abdomen containing multiple loops of small bowel, without evidence of incarceration or obstruction at this time. Trace volume of ascites. No pneumoperitoneum. Musculoskeletal: There are no aggressive appearing lytic or blastic lesions noted in the visualized portions of the skeleton. IMPRESSION: 1. Obstructive 10 mm calculus at the left ureteropelvic junction with with moderate to severe proximal hydronephrosis and perinephric stranding. 2. Multiple additional nonobstructive calculi are present within the collecting systems of the kidneys bilaterally, and in the lumen of the urinary bladder. 3. Large partially calcified stone in the distal common bile duct associated with proximal hepatic ductal dilatation. This appears to be chronic, and is similar to remote prior study 07/18/2014. 4. Trace volume of ascites. 5. Right  renal atrophy. 6. Small left pleural effusion lying dependently. 7. Findings in the lung bases suggestive of potential interstitial lung disease. This could be further evaluated with nonemergent high-resolution chest CT and referral to Pulmonology in the near future if clinically appropriate. 8. Extensive atherosclerosis, including at least 2 vessel coronary artery disease. Additionally, the distal infrarenal abdominal aorta and proximal common iliac arteries are very diminutive in size, which could indicate chronic occlusion or high-grade stenosis. 9. Large ventral hernia in the anterior abdominal Schramm containing multiple loops of small bowel, without evidence of incarceration or obstruction at this time. 10. Additional incidental findings, as above. Electronically Signed   By: Vinnie Langton M.D.   On: 08/04/2015 16:20   X-ray Chest Pa And Lateral  08/03/2015  CLINICAL DATA:  74 year old male with fever EXAM: CHEST  2 VIEW COMPARISON:  Prior chest x-ray 07/18/2014 FINDINGS: Cardiac and mediastinal contours are within normal limits no focal airspace consolidation, pulmonary edema, pleural effusion or pneumothorax. Atherosclerotic calcifications are present in the transverse aorta. Mild central bronchitic change remains. No acute osseous abnormality. IMPRESSION: No active cardiopulmonary process. Electronically Signed   By: Jacqulynn Cadet M.D.   On: 08/03/2015 14:07   US Renal  08/04/2015  CLINICAL DATA:  Acute tubular necrosis. EXAM: RENAL / URINARY TRACT ULTRASOUND COMPLETE COMPARISON:  08/07/2014. FINDINGS: Right Kidney: Length: 8.3 cm. Increased echogenicity with progression. Diffuse parenchymal thinning and prominent renal sinus fat, also with progression. 6 mm calculus in the mid kidney. No hydronephrosis. Left Kidney: Length: 13.2 cm. Interval diffusely increased echogenicity. Interval mild to moderate dilatation of the collecting system. 10 mm calculus in  the kidney. Bladder: Not distended, with a  suprapubic catheter in place. IMPRESSION: 1. Interval mild to moderate left hydronephrosis, most likely due to a nonvisualized ureteral calculus. 2. Bilateral nonobstructing renal calculi. 3. Interval increased echogenicity of both kidneys, compatible with medical renal disease. 4. Progressive right renal atrophy. Electronically Signed   By: Claudie Revering M.D.   On: 08/04/2015 11:14   Ir Fluoro Guide Cv Line Right  08/05/2015  INDICATION: History of chronic suprapubic catheter placement, now with obstructive uropathy and concern for impending urosepsis secondary to an approximately 1 cm stone within the superior aspect of left ureter with associated moderate severe left-sided pelvicaliectasis and worsening renal insufficiency. Request made for placement of a left-sided percutaneous nephrostomy catheter as well as a temporary dialysis catheter. EXAM: NON-TUNNELED CENTRAL VENOUS HEMODIALYSIS CATHETER PLACEMENT WITH ULTRASOUND AND FLUOROSCOPIC GUIDANCE COMPARISON:  None. MEDICATIONS: Versed 1 mg IV; Fentanyl 50 mcg IV; Ancef 2 g IV; the antibiotic was administered with an appropriate time frame prior to the initiation of the procedure. CONTRAST:  None FLUOROSCOPY TIME:  12 seconds (3.8 mGy) COMPLICATIONS: None immediate PROCEDURE: Informed written consent was obtained from the patient after a discussion of the risks, benefits, and alternatives to treatment. Questions regarding the procedure were encouraged and answered. The right neck and chest were prepped with chlorhexidine in a sterile fashion, and a sterile drape was applied covering the operative field. Maximum barrier sterile technique with sterile gowns and gloves were used for the procedure. A timeout was performed prior to the initiation of the procedure. After creating a small venotomy incision, a micropuncture kit was utilized to access the right internal jugular vein under direct, real-time ultrasound guidance after the overlying soft tissues were  anesthetized with 1% lidocaine with epinephrine. Ultrasound image documentation was performed. The microwire was kinked to measure appropriate catheter length. A stiff glidewire was advanced to the level of the IVC. Under fluoroscopic guidance, the venotomy was serially dilated, ultimately allowing placement of a 20 cm temporary Trialysis catheter with tip ultimately terminating within the superior aspect of the right atrium. Final catheter positioning was confirmed and documented with a spot radiographic image. The catheter aspirates and flushes normally. The catheter was flushed with appropriate volume heparin dwells. The catheter exit site was secured with a 0-Prolene retention suture. A dressing was placed. The patient tolerated the procedure well without immediate post procedural complication. IMPRESSION: Successful placement of a right internal jugular approach 20 cm temporary dialysis catheter with tip terminating with in the superior aspect of the right atrium. The catheter is ready for immediate use. PLAN: This catheter may be converted to a tunneled dialysis catheter at a later date as indicated. Electronically Signed   By: Sandi Mariscal M.D.   On: 08/05/2015 11:51   Ir US Guide Vasc Access Right  08/05/2015  INDICATION: History of chronic suprapubic catheter placement, now with obstructive uropathy and concern for impending urosepsis secondary to an approximately 1 cm stone within the superior aspect of left ureter with associated moderate severe left-sided pelvicaliectasis and worsening renal insufficiency. Request made for placement of a left-sided percutaneous nephrostomy catheter as well as a temporary dialysis catheter. EXAM: NON-TUNNELED CENTRAL VENOUS HEMODIALYSIS CATHETER PLACEMENT WITH ULTRASOUND AND FLUOROSCOPIC GUIDANCE COMPARISON:  None. MEDICATIONS: Versed 1 mg IV; Fentanyl 50 mcg IV; Ancef 2 g IV; the antibiotic was administered with an appropriate time frame prior to the initiation of the  procedure. CONTRAST:  None FLUOROSCOPY TIME:  12 seconds (3.8 mGy) COMPLICATIONS: None immediate PROCEDURE:  Informed written consent was obtained from the patient after a discussion of the risks, benefits, and alternatives to treatment. Questions regarding the procedure were encouraged and answered. The right neck and chest were prepped with chlorhexidine in a sterile fashion, and a sterile drape was applied covering the operative field. Maximum barrier sterile technique with sterile gowns and gloves were used for the procedure. A timeout was performed prior to the initiation of the procedure. After creating a small venotomy incision, a micropuncture kit was utilized to access the right internal jugular vein under direct, real-time ultrasound guidance after the overlying soft tissues were anesthetized with 1% lidocaine with epinephrine. Ultrasound image documentation was performed. The microwire was kinked to measure appropriate catheter length. A stiff glidewire was advanced to the level of the IVC. Under fluoroscopic guidance, the venotomy was serially dilated, ultimately allowing placement of a 20 cm temporary Trialysis catheter with tip ultimately terminating within the superior aspect of the right atrium. Final catheter positioning was confirmed and documented with a spot radiographic image. The catheter aspirates and flushes normally. The catheter was flushed with appropriate volume heparin dwells. The catheter exit site was secured with a 0-Prolene retention suture. A dressing was placed. The patient tolerated the procedure well without immediate post procedural complication. IMPRESSION: Successful placement of a right internal jugular approach 20 cm temporary dialysis catheter with tip terminating with in the superior aspect of the right atrium. The catheter is ready for immediate use. PLAN: This catheter may be converted to a tunneled dialysis catheter at a later date as indicated. Electronically Signed    By: Sandi Mariscal M.D.   On: 08/05/2015 11:51   Ir Nephrostomy Placement Left  08/05/2015  INDICATION: History of chronic suprapubic catheter placement, now with obstructive uropathy and concern for impending urosepsis secondary to an approximately 1 cm stone within the superior aspect of left ureter with associated moderate severe left-sided pelvicaliectasis and worsening renal insufficiency. Request made for placement of a left-sided percutaneous nephrostomy catheter as well as a temporary dialysis catheter. EXAM: 1. ULTRASOUND GUIDANCE FOR PUNCTURE OF THE LEFT RENAL COLLECTING SYSTEM 2. LEFT PERCUTANEOUS NEPHROSTOMY TUBE PLACEMENT. COMPARISON:  CT abdomen pelvis - 08/04/2015 MEDICATIONS: Ancef 2 g IV; The antibiotic was administered in an appropriate time frame prior to skin puncture. ANESTHESIA/SEDATION: Fentanyl 100 mcg IV; Versed 2 mg IV Total Moderate Sedation Time 10 minutes. CONTRAST:  10 mL Isovue 300 administered into the collecting system FLUOROSCOPY TIME:  1 minutes 6 seconds (48.0 mGy) COMPLICATIONS: None immediate PROCEDURE: The procedure, risks, benefits, and alternatives were explained to the patient. Questions regarding the procedure were encouraged and answered. The patient understands and consents to the procedure. A timeout was performed prior to the initiation of the procedure. The left flank region was prepped with Betadine in a sterile fashion, and a sterile drape was applied covering the operative field. A sterile gown and sterile gloves were used for the procedure. A preprocedural spot radiographs obtained of the right mid hemi abdomen. Local anesthesia was provided with 1% Lidocaine with epinephrine. Ultrasound was used to localize the left kidney. Under direct ultrasound guidance, a 21 gauge needle was advanced into the renal collecting system. An ultrasound image documentation was performed. Access within the collecting system was confirmed with the efflux of urine followed by contrast  injection. Over a Nitrex wire, the inner three [place] catheter of an Accustick set was advanced into the renal collecting system. Contrast injection was injected into the collecting system as several spot radiographs  were obtained in various obliquities confirming puncture within a posterior inferior calix. As such, the tract was dilated with an Accustick stent. Over a guide wire, a 10-French percutaneous nephrostomy catheter was advanced into the collecting system where the coil was formed and locked. Contrast was injected and several sport radiographs were obtained in various obliquities confirming access. A small sample was aspirated from the nephrostomy, capped and sent to the laboratory for analysis. The catheter was secured at the skin with a Prolene retention suture and a gravity bag was placed. A dressing was placed. The patient tolerated procedure well without immediate postprocedural complication. FINDINGS: Preprocedural spot radiographic image demonstrates a punctate opacity overlying the expected location the superior aspect of the left ureter which correlates with the obstructing ureteral stone seen on preceding abdominal CT. Ultrasound scanning demonstrates a moderate to severely dilated left collecting system. Under direct ultrasound guidance, a posterior inferior calix was targeted allowing advancement of an 10-French percutaneous nephrostomy catheter under intermittent fluoroscopic guidance. Contrast injection confirmed appropriate positioning. A small sample of aspirated urine was capped and sent to the laboratory for analysis. IMPRESSION: Successful ultrasound and fluoroscopic guided placement of a left sided 10 French PCN. A small sample of aspirated urine was capped and sent to the laboratory for analysis. PLAN: This percutaneous access could be utilized for subsequent percutaneous nephrolithotomy as clinically indicated. Electronically Signed   By: Sandi Mariscal M.D.   On: 08/05/2015 11:49     Labs:  CBC:  Recent Labs  08/03/15 1207 08/04/15 0634 08/05/15 0207 08/06/15 0319  WBC 23.6* 14.4* 8.8 7.9  HGB 14.6 12.2* 10.2* 11.0*  HCT 43.1 37.2* 31.3* 33.5*  PLT 118* 93* 81* 80*    COAGS:  Recent Labs  08/04/15 1810  INR 1.25  APTT 36    BMP:  Recent Labs  08/04/15 0634 08/04/15 2348 08/05/15 0207 08/06/15 0319  NA 137 137 144 145  K 5.8* 4.8 5.3* 4.5  CL 106 104 111 118*  CO2 21* 20* 20* 18*  GLUCOSE 117* 107* 103* 104*  BUN 61* 70* 70* 52*  CALCIUM 8.4* 8.4* 8.3* 8.0*  CREATININE 6.37* 7.36* 7.81* 4.73*  GFRNONAA 8* 7* 6* 11*  GFRAA 9* 8* 7* 13*    LIVER FUNCTION TESTS:  Recent Labs  08/03/15 1207 08/05/15 0207 08/06/15 0319  BILITOT 1.1 0.4 0.7  AST 22 10* 17  ALT 15* 9* 10*  ALKPHOS 112 71 69  PROT 7.7 5.4* 5.6*  ALBUMIN 3.9 2.3* 2.4*    Assessment and Plan:  Left hydronephrosis--ureteropelvic stone L PCN placed 11/6 Doing well Eating full diet Feeling great  Signed: Tabatha Razzano A 08/06/2015, 8:59 AM   I spent a total of 15 Minutes at the the patient's bedside AND on the patient's hospital floor or unit, greater than 50% of which was counseling/coordinating care for L PCN 11/6

## 2015-08-06 NOTE — Progress Notes (Signed)
Pt transferred to 3E-04 via bed accompanied by RN and NA on [facility] and tele. No events during transfer. Upon arrival pt HDS, no s/s of resp distress, lines, tubes, and dressings in tact. Report given to Darla, RN with no further questions per receiving RN

## 2015-08-06 NOTE — Evaluation (Signed)
Clinical/Bedside Swallow Evaluation Patient Details  Name: Jordan Ward MRN: [id number] Date of Birth: 09/26/1941  Today's Date: 08/06/2015 Time: SLP Start Time (ACUTE ONLY): 0846 SLP Stop Time (ACUTE ONLY): 0909 SLP Time Calculation (min) (ACUTE ONLY): 23 min  Past Medical History:  Past Medical History  Diagnosis Date  . Arthritis   . MS (multiple sclerosis) ([place])   . Insomnia   . PVD (peripheral vascular disease) ([place])   . Carotid artery stenosis   . TIA (transient ischemic attack)   . Chronic indwelling Foley catheter 10/06/2011  . Pulmonary fibrosis ([place]) 10/06/2011  . Dysphagia 10/07/2011  . Tremors of nervous system 10/08/2011  . Hypothyroidism 10/08/2011  . Pulmonary nodule 10/08/2011  . C. difficile colitis 09/2011  . Pneumonia   . Encephalopathy   . Urinary tract infection   . Tremors of nervous system   . Hypokalemia   . Junctional rhythm   . Anemia   . Hypernatremia   . Sacral ulcer   . HTN (hypertension), malignant 10/06/2011  . Sleep apnea   . Neuromuscular disorder ([place])   . Back pain, chronic   . Kidney stones   . High grade dysplasia in colonic adenoma 09/2005  . Gait disorder   . OSA (obstructive sleep apnea)   . Paroxysmal atrial tachycardia ([place])   . Dyslipidemia   . Cerebrovascular disease   . CAD (coronary artery disease)   . Peripheral vascular disease ([place])   . Bilateral carotid bruits   . Colon polyps   . HA (headache)    Past Surgical History:  Past Surgical History  Procedure Laterality Date  . Inguinal hernia repair  1971    bilateral  . Back surgery  1976/1979  . Colonoscopy  11/2004    Dr. Sharol Roussel sessile polyp splenic flexure, 51mm sessile polyp desc colon, tubulovillous adenoma (bx not removed)  . Colonoscopy  01/2005    poor prep, polyp could not be found  . Colonoscopy  05/2005    with EMR, polypectomy Dr. Olegario Messier, bx showed high grade dysplasia, partially resected  . Colonoscopy  09/2005    Dr. Arsenio Loader, [place] ink tattooing, four  villous colon polyp (3 had been missed on previous colonoscopies due to limitations of procedures  . Colon surgery  09/2005    Fleishman: four tubular adenomas, large adenomatous polyp with HIGH GRADE dysplasia  . Appendectomy  09/2005    at time of left hemicolectomy  . Colonoscopy  09/2006    normal TI, no polyps  . Colonoscopy  10/2007    Dr. Imogene Burn distal mammillations, benign bx, normal TI, random bx neg for microscopic colitis  . Cholecystectomy      Dr. Tamala Julian  . Suprapubic catheter insertion     HPI:  74 y.o. male who presented with UTI possible urosepsis and decreased output in suprapubic catheter with concern for dehydration. PMH: multiple sclerosis, TIA, pulmonary fibrosis, UTI, HTN, OSA, CVA and dysphagia. MBS 08/2014 recommended Dys 3, nectar thick liquids, small sips, chin tuck, throat clear and 2 swallows. MBS 09/2014 recommended Dys 3, nectar thick liquids "during meals and may have thins when not eating food with smal sips, clear throat and second swallow . Zenker's diverticulum confirmed by radiologist. Diet changed to nectar thick liquids 11/6, ST eval ordered. Note from MD today states "pt upset b/c diet was changed."   Assessment / Plan / Recommendation Clinical Impression  Pt states he was thickening his liquids to nectar consistency during meals and thin liquids per  MBS 09/2014 at home. He required mod-max verbal and visual cues to use tongue/swish and spit to remove significant right sided pocketing (eggs). Small sip thin coffee attempted resulting in mildly delayed cough. Recommendation to continue Dys 3 diet, nectar liquids allowing thin liquids 30 minutes after meals and after oral care, check for pocketed foood, 2 swallows and intermittent throat clear. ST will continue to follow.      Aspiration Risk  Moderate    Diet Recommendation Dysphagia 3 (Mech soft);Nectar (thin 30 min after meals after oral care)   Medication Administration: Crushed with  puree Compensations: Small sips/bites;Slow rate;Multiple dry swallows after each bite/sip;Clear throat intermittently    Other  Recommendations Oral Care Recommendations: Oral care BID   Follow Up Recommendations       Frequency and Duration min 2x/week  2 weeks   Pertinent Vitals/Pain none    SLP Swallow Goals     Swallow Study Prior Functional Status       General Other Pertinent Information: 74 y.o. male who presented with UTI possible urosepsis and decreased output in suprapubic catheter with concern for dehydration. PMH: multiple sclerosis, TIA, pulmonary fibrosis, UTI, HTN, OSA, CVA and dysphagia. MBS 08/2014 recommended Dys 3, nectar thick liquids, small sips, chin tuck, throat clear and 2 swallows. MBS 09/2014 recommended Dys 3, nectar thick liquids "during meals and may have thins when not eating food with smal sips, clear throat and second swallow . Zenker's diverticulum confirmed by radiologist. Diet changed to nectar thick liquids 11/6, ST eval ordered. Note from MD today states "pt upset b/c diet was changed." Type of Study: Bedside swallow evaluation Previous Swallow Assessment:  (see HPI) Diet Prior to this Study: Dysphagia 3 (soft);Nectar-thick liquids (thin liquids in between meals) Temperature Spikes Noted: No Respiratory Status: Supplemental O2 delivered via (comment) History of Recent Intubation: No Behavior/Cognition: Cooperative;Alert;Pleasant mood Oral Cavity - Dentition: Adequate natural dentition/normal for age Self-Feeding Abilities: Able to feed self Patient Positioning: Upright in bed Baseline Vocal Quality: Normal Volitional Cough: Strong Volitional Swallow: Able to elicit    Oral/Motor/Sensory Function Overall Oral Motor/Sensory Function: Appears within functional limits for tasks assessed   Ice Chips Ice chips: Not tested   Thin Liquid Thin Liquid: Impaired Presentation: Cup Pharyngeal  Phase Impairments: Cough - Delayed    Nectar Thick Nectar  Thick Liquid:  (nectar too thick (honey), no time to thin, transferring room)   Honey Thick Honey Thick Liquid: Not tested   Puree Puree: Within functional limits   Solid   GO    Solid: Impaired Presentation: Self Fed Oral Phase Impairments: Reduced lingual movement/coordination Oral Phase Functional Implications: Right lateral sulci pocketing;Oral residue       Houston Siren 08/06/2015,9:29 AM   Orbie Pyo Colvin Caroli.[employer] [phone]

## 2015-08-06 NOTE — Care Management Important Message (Signed)
Important Message  Patient Details  Name: Jordan Ward MRN: [id number] Date of Birth: September 16, 1941   Medicare Important Message Given:  Yes-second notification given    Nathen May 08/06/2015, 3:37 PM

## 2015-08-06 NOTE — Progress Notes (Signed)
Subjective: Patient reports that he is feeling better. No nausea or vomiting. Excellent urinary output  Objective: Vital signs in last 24 hours: Temp:  [98 F (36.7 C)-98.8 F (37.1 C)] 98.4 F (36.9 C) (11/07 1556) Pulse Rate:  [57-75] 64 (11/07 1600) Resp:  [13-22] 18 (11/07 0939) BP: (113-172)/(73-99) 133/73 mmHg (11/07 1600) SpO2:  [93 %-100 %] 94 % (11/07 1556) Weight:  [54.522 kg (120 lb 3.2 oz)-61.9 kg (136 lb 7.4 oz)] 54.522 kg (120 lb 3.2 oz) (11/07 0939)  Intake/Output from previous day: 11/06 0701 - 11/07 0700 In: 2829.2 [P.O.:360; I.V.:2269.2; IV Piggyback:200] Out: 6750 [Urine:6750] Intake/Output this shift:    Physical Exam:  Constitutional: Vital signs reviewed. WD WN in NAD   Eyes: PERRL, No scleral icterus.   Cardiovascular: RRR Pulmonary/Chest: Normal effort Extremities: No cyanosis or edema   Lab Results:  Recent Labs  08/04/15 0634 08/05/15 0207 08/06/15 0319  HGB 12.2* 10.2* 11.0*  HCT 37.2* 31.3* 33.5*   BMET  Recent Labs  08/05/15 0207 08/06/15 0319  NA 144 145  K 5.3* 4.5  CL 111 118*  CO2 20* 18*  GLUCOSE 103* 104*  BUN 70* 52*  CREATININE 7.81* 4.73*  CALCIUM 8.3* 8.0*    Recent Labs  08/04/15 1810  INR 1.25   No results for input(s): LABURIN in the last 72 hours. Results for orders placed or performed during the hospital encounter of 08/03/15  MRSA PCR Screening     Status: None   Collection Time: 08/04/15  9:18 PM  Result Value Ref Range Status   MRSA by PCR NEGATIVE NEGATIVE Final    Comment:        The GeneXpert MRSA Assay (FDA approved for NASAL specimens only), is one component of a comprehensive MRSA colonization surveillance program. It is not intended to diagnose MRSA infection nor to guide or monitor treatment for MRSA infections.   Urine culture     Status: None   Collection Time: 08/04/15  9:45 PM  Result Value Ref Range Status   Specimen Description URINE, CLEAN CATCH  Final   Special Requests  cipro  Final   Culture MULTIPLE SPECIES PRESENT, SUGGEST RECOLLECTION  Final   Report Status 08/06/2015 FINAL  Final  Culture, blood (routine x 2)     Status: None (Preliminary result)   Collection Time: 08/04/15 11:30 PM  Result Value Ref Range Status   Specimen Description BLOOD LEFT ARM  Final   Special Requests BOTTLES DRAWN AEROBIC AND ANAEROBIC 10CC  Final   Culture NO GROWTH 1 DAY  Final   Report Status PENDING  Incomplete  Culture, blood (routine x 2)     Status: None (Preliminary result)   Collection Time: 08/04/15 11:48 PM  Result Value Ref Range Status   Specimen Description BLOOD LEFT HAND  Final   Special Requests IN PEDIATRIC BOTTLE 1CC  Final   Culture NO GROWTH 1 DAY  Final   Report Status PENDING  Incomplete  Urine culture     Status: None   Collection Time: 08/05/15 11:51 AM  Result Value Ref Range Status   Specimen Description URINE, SUPRAPUBIC  Final   Special Requests Normal  Final   Culture NO GROWTH 1 DAY  Final   Report Status 08/06/2015 FINAL  Final    Studies/Results: Ir Fluoro Guide Cv Line Right  08/05/2015  INDICATION: History of chronic suprapubic catheter placement, now with obstructive uropathy and concern for impending urosepsis secondary to an approximately 1 cm stone within  the superior aspect of left ureter with associated moderate severe left-sided pelvicaliectasis and worsening renal insufficiency. Request made for placement of a left-sided percutaneous nephrostomy catheter as well as a temporary dialysis catheter. EXAM: NON-TUNNELED CENTRAL VENOUS HEMODIALYSIS CATHETER PLACEMENT WITH ULTRASOUND AND FLUOROSCOPIC GUIDANCE COMPARISON:  None. MEDICATIONS: Versed 1 mg IV; Fentanyl 50 mcg IV; Ancef 2 g IV; the antibiotic was administered with an appropriate time frame prior to the initiation of the procedure. CONTRAST:  None FLUOROSCOPY TIME:  12 seconds (3.8 mGy) COMPLICATIONS: None immediate PROCEDURE: Informed written consent was obtained from the  patient after a discussion of the risks, benefits, and alternatives to treatment. Questions regarding the procedure were encouraged and answered. The right neck and chest were prepped with chlorhexidine in a sterile fashion, and a sterile drape was applied covering the operative field. Maximum barrier sterile technique with sterile gowns and gloves were used for the procedure. A timeout was performed prior to the initiation of the procedure. After creating a small venotomy incision, a micropuncture kit was utilized to access the right internal jugular vein under direct, real-time ultrasound guidance after the overlying soft tissues were anesthetized with 1% lidocaine with epinephrine. Ultrasound image documentation was performed. The microwire was kinked to measure appropriate catheter length. A stiff glidewire was advanced to the level of the IVC. Under fluoroscopic guidance, the venotomy was serially dilated, ultimately allowing placement of a 20 cm temporary Trialysis catheter with tip ultimately terminating within the superior aspect of the right atrium. Final catheter positioning was confirmed and documented with a spot radiographic image. The catheter aspirates and flushes normally. The catheter was flushed with appropriate volume heparin dwells. The catheter exit site was secured with a 0-Prolene retention suture. A dressing was placed. The patient tolerated the procedure well without immediate post procedural complication. IMPRESSION: Successful placement of a right internal jugular approach 20 cm temporary dialysis catheter with tip terminating with in the superior aspect of the right atrium. The catheter is ready for immediate use. PLAN: This catheter may be converted to a tunneled dialysis catheter at a later date as indicated. Electronically Signed   By: Sandi Mariscal M.D.   On: 08/05/2015 11:51   Ir US Guide Vasc Access Right  08/05/2015  INDICATION: History of chronic suprapubic catheter placement,  now with obstructive uropathy and concern for impending urosepsis secondary to an approximately 1 cm stone within the superior aspect of left ureter with associated moderate severe left-sided pelvicaliectasis and worsening renal insufficiency. Request made for placement of a left-sided percutaneous nephrostomy catheter as well as a temporary dialysis catheter. EXAM: NON-TUNNELED CENTRAL VENOUS HEMODIALYSIS CATHETER PLACEMENT WITH ULTRASOUND AND FLUOROSCOPIC GUIDANCE COMPARISON:  None. MEDICATIONS: Versed 1 mg IV; Fentanyl 50 mcg IV; Ancef 2 g IV; the antibiotic was administered with an appropriate time frame prior to the initiation of the procedure. CONTRAST:  None FLUOROSCOPY TIME:  12 seconds (3.8 mGy) COMPLICATIONS: None immediate PROCEDURE: Informed written consent was obtained from the patient after a discussion of the risks, benefits, and alternatives to treatment. Questions regarding the procedure were encouraged and answered. The right neck and chest were prepped with chlorhexidine in a sterile fashion, and a sterile drape was applied covering the operative field. Maximum barrier sterile technique with sterile gowns and gloves were used for the procedure. A timeout was performed prior to the initiation of the procedure. After creating a small venotomy incision, a micropuncture kit was utilized to access the right internal jugular vein under direct, real-time ultrasound guidance after  the overlying soft tissues were anesthetized with 1% lidocaine with epinephrine. Ultrasound image documentation was performed. The microwire was kinked to measure appropriate catheter length. A stiff glidewire was advanced to the level of the IVC. Under fluoroscopic guidance, the venotomy was serially dilated, ultimately allowing placement of a 20 cm temporary Trialysis catheter with tip ultimately terminating within the superior aspect of the right atrium. Final catheter positioning was confirmed and documented with a spot  radiographic image. The catheter aspirates and flushes normally. The catheter was flushed with appropriate volume heparin dwells. The catheter exit site was secured with a 0-Prolene retention suture. A dressing was placed. The patient tolerated the procedure well without immediate post procedural complication. IMPRESSION: Successful placement of a right internal jugular approach 20 cm temporary dialysis catheter with tip terminating with in the superior aspect of the right atrium. The catheter is ready for immediate use. PLAN: This catheter may be converted to a tunneled dialysis catheter at a later date as indicated. Electronically Signed   By: Sandi Mariscal M.D.   On: 08/05/2015 11:51   Ir Nephrostomy Placement Left  08/05/2015  INDICATION: History of chronic suprapubic catheter placement, now with obstructive uropathy and concern for impending urosepsis secondary to an approximately 1 cm stone within the superior aspect of left ureter with associated moderate severe left-sided pelvicaliectasis and worsening renal insufficiency. Request made for placement of a left-sided percutaneous nephrostomy catheter as well as a temporary dialysis catheter. EXAM: 1. ULTRASOUND GUIDANCE FOR PUNCTURE OF THE LEFT RENAL COLLECTING SYSTEM 2. LEFT PERCUTANEOUS NEPHROSTOMY TUBE PLACEMENT. COMPARISON:  CT abdomen pelvis - 08/04/2015 MEDICATIONS: Ancef 2 g IV; The antibiotic was administered in an appropriate time frame prior to skin puncture. ANESTHESIA/SEDATION: Fentanyl 100 mcg IV; Versed 2 mg IV Total Moderate Sedation Time 10 minutes. CONTRAST:  10 mL Isovue 300 administered into the collecting system FLUOROSCOPY TIME:  1 minutes 6 seconds (28.3 mGy) COMPLICATIONS: None immediate PROCEDURE: The procedure, risks, benefits, and alternatives were explained to the patient. Questions regarding the procedure were encouraged and answered. The patient understands and consents to the procedure. A timeout was performed prior to the  initiation of the procedure. The left flank region was prepped with Betadine in a sterile fashion, and a sterile drape was applied covering the operative field. A sterile gown and sterile gloves were used for the procedure. A preprocedural spot radiographs obtained of the right mid hemi abdomen. Local anesthesia was provided with 1% Lidocaine with epinephrine. Ultrasound was used to localize the left kidney. Under direct ultrasound guidance, a 21 gauge needle was advanced into the renal collecting system. An ultrasound image documentation was performed. Access within the collecting system was confirmed with the efflux of urine followed by contrast injection. Over a Nitrex wire, the inner three [place] catheter of an Accustick set was advanced into the renal collecting system. Contrast injection was injected into the collecting system as several spot radiographs were obtained in various obliquities confirming puncture within a posterior inferior calix. As such, the tract was dilated with an Accustick stent. Over a guide wire, a 10-French percutaneous nephrostomy catheter was advanced into the collecting system where the coil was formed and locked. Contrast was injected and several sport radiographs were obtained in various obliquities confirming access. A small sample was aspirated from the nephrostomy, capped and sent to the laboratory for analysis. The catheter was secured at the skin with a Prolene retention suture and a gravity bag was placed. A dressing was placed. The patient tolerated  procedure well without immediate postprocedural complication. FINDINGS: Preprocedural spot radiographic image demonstrates a punctate opacity overlying the expected location the superior aspect of the left ureter which correlates with the obstructing ureteral stone seen on preceding abdominal CT. Ultrasound scanning demonstrates a moderate to severely dilated left collecting system. Under direct ultrasound guidance, a posterior  inferior calix was targeted allowing advancement of an 10-French percutaneous nephrostomy catheter under intermittent fluoroscopic guidance. Contrast injection confirmed appropriate positioning. A small sample of aspirated urine was capped and sent to the laboratory for analysis. IMPRESSION: Successful ultrasound and fluoroscopic guided placement of a left sided 10 French PCN. A small sample of aspirated urine was capped and sent to the laboratory for analysis. PLAN: This percutaneous access could be utilized for subsequent percutaneous nephrolithotomy as clinically indicated. Electronically Signed   By: Sandi Mariscal M.D.   On: 08/05/2015 11:49    Assessment/Plan:   Left UPJ stone with significant hydronephrosis and renal insufficiency (atrophic right kidney), status post percutaneous nephrostomy tube placement with adequate downward trend of creatinine as well as overall patient improvement.    Long-term, are options would include percutaneous nephrolithotomy which would take care of his left UPJ stone as well as his other left renal stone burden versus shockwave lithotripsy of the UPJ stone, which would leave his renal calculi untreated. I would favor the former, although that would involve a more aggressive, anesthetic procedure.    The patient has an appointment to see me next week in our [place] office-unless further urologic intervention is needed before then, I will sign off at this point, and I will expect to discuss his choices at that time.   LOS: 3 days   Franchot Gallo M 08/06/2015, 7:27 PM

## 2015-08-06 NOTE — Progress Notes (Signed)
Subjective:  UOP great, over 6 liters- not noted if from perc tube or other- pt says is from perc tube- creatinine down a lot- BP had been reasonable but up this AM ?? Is upset because "diet was changed" see discussion in nurses notes Objective Vital signs in last 24 hours: Filed Vitals:   08/06/15 0441 08/06/15 0500 08/06/15 0700 08/06/15 0800  BP: 119/75  159/88 172/90  Pulse: 72  69 75  Temp: 98.5 F (36.9 C)     TempSrc: Oral     Resp: '14  13 19  ' Height:      Weight:  61.9 kg (136 lb 7.4 oz)    SpO2: 100%  99% 100%   Weight change: -0.1 kg (-3.5 oz)  Intake/Output Summary (Last 24 hours) at 08/06/15 9357 Last data filed at 08/06/15 0710  Gross per 24 hour  Intake 2754.17 ml  Output   7000 ml  Net -4245.83 ml    Assessment/ Plan: Pt is a 74 y.o. yo male who was admitted on 08/03/2015 with A on CRF in the setting of an obstructed solitary functioning kidney Assessment/Plan: 1. AKI- in the setting of an obstructed solitary functioning kidney with stone.  After perc tube kidney function much better- anticipate continued improvement.  Suspect dialysis will not be needed  2. HTN/volume- on IVF to keep up with post obstructive diuresis urinary losses  3. Anemia- not too much of an issue at this time 4. obstructing stone- per urology - definitive procedure pending- s/p perc tube 5. ID- WBC much better- ancef/primaxin     Annick Dimaio A    Labs: Basic Metabolic Panel:  Recent Labs Lab 08/04/15 2348 08/05/15 0207 08/06/15 0319  NA 137 144 145  K 4.8 5.3* 4.5  CL 104 111 118*  CO2 20* 20* 18*  GLUCOSE 107* 103* 104*  BUN 70* 70* 52*  CREATININE 7.36* 7.81* 4.73*  CALCIUM 8.4* 8.3* 8.0*   Liver Function Tests:  Recent Labs Lab 08/03/15 1207 08/05/15 0207 08/06/15 0319  AST 22 10* 17  ALT 15* 9* 10*  ALKPHOS 112 71 69  BILITOT 1.1 0.4 0.7  PROT 7.7 5.4* 5.6*  ALBUMIN 3.9 2.3* 2.4*   No results for input(s): LIPASE, AMYLASE in the last 168 hours. No  results for input(s): AMMONIA in the last 168 hours. CBC:  Recent Labs Lab 08/03/15 1207 08/04/15 0634 08/05/15 0207 08/06/15 0319  WBC 23.6* 14.4* 8.8 7.9  NEUTROABS 20.0*  --   --   --   HGB 14.6 12.2* 10.2* 11.0*  HCT 43.1 37.2* 31.3* 33.5*  MCV 91.3 92.5 91.5 93.6  PLT 118* 93* 81* 80*   Cardiac Enzymes: No results for input(s): CKTOTAL, CKMB, CKMBINDEX, TROPONINI in the last 168 hours. CBG: No results for input(s): GLUCAP in the last 168 hours.  Iron Studies: No results for input(s): IRON, TIBC, TRANSFERRIN, FERRITIN in the last 72 hours. Studies/Results: Ct Abdomen Pelvis Wo Contrast  08/04/2015  CLINICAL DATA:  74 year old male with history of acute tubular necrosis and no urine output for the past 3 days. History of suprapubic catheter in place for the past 3 years. EXAM: CT ABDOMEN AND PELVIS WITHOUT CONTRAST TECHNIQUE: Multidetector CT imaging of the abdomen and pelvis was performed following the standard protocol without IV contrast. COMPARISON:  CT the abdomen and pelvis 07/18/2014. FINDINGS: Lower chest: Peripheral ground-glass attenuation and subpleural reticulation in the visualize lung bases, concerning for underlying interstitial lung disease. Small left pleural effusion lying dependently. Calcifications of  the aortic valve. Atherosclerotic calcifications in the right coronary artery. Atherosclerotic calcifications in the left circumflex and right coronary arteries. Hepatobiliary: No definite cystic or solid hepatic lesions are identified on today's noncontrast CT examination. Status post cholecystectomy. 16 x 10 x 7 mm partially calcified stone in the distal common bile duct (image 32 of series 2). Although poorly evaluated on today's noncontrast CT examination, there does appear to be ductal dilatation, as the common bile duct measures at least 12 mm in the porta hepatis. Pancreas: No definite pancreatic mass or peripancreatic inflammatory changes on today's noncontrast CT  examination. Spleen: Unremarkable. Adrenals/Urinary Tract: 10 mm calculus at the left ureteropelvic junction associated with moderate to severe proximal hydronephrosis and perinephric stranding. Multiple additional nonobstructive calculi are present within the collecting systems of the kidneys bilaterally, the largest of which is in the lower pole of the left kidney measuring 9 mm. No additional ureteral stones are noted. 9 mm stone in the inferior aspect of the urinary bladder. Right kidney is atrophic. Multiple low-attenuation lesions in the left kidney again noted, incompletely characterized on today's noncontrast CT examination, but similar to the prior study, likely cysts, largest of which measures 2.6 cm extending exophytic the off the lateral aspect of the upper pole of the left kidney. Urinary bladder is nearly completely decompressed with an indwelling suprapubic catheter in position. Bilateral adrenal glands are normal in appearance. Stomach/Bowel: The unenhanced appearance of the stomach is normal. Suture line in the proximal sigmoid colon, and absence of much of the left hemicolon related to prior partial colectomy. Status post appendectomy. Vascular/Lymphatic: Extensive atherosclerosis throughout the abdominal and pelvic vasculature, without evidence of aneurysm. Notably, the distal abdominal aorta is very diminutive in size, as are the proximal common iliac arteries, which could indicate chronic occlusion. Multiple borderline enlarged upper abdominal lymph nodes, largest of which is a high left para-aortic lymph node adjacent to the left renal hilum measuring 11 mm in short axis. This is likely reactive. Reproductive: Prostate gland and seminal vesicles are unremarkable in appearance. Other: Ventral hernia in the lower abdomen containing multiple loops of small bowel, without evidence of incarceration or obstruction at this time. Trace volume of ascites. No pneumoperitoneum. Musculoskeletal: There are  no aggressive appearing lytic or blastic lesions noted in the visualized portions of the skeleton. IMPRESSION: 1. Obstructive 10 mm calculus at the left ureteropelvic junction with with moderate to severe proximal hydronephrosis and perinephric stranding. 2. Multiple additional nonobstructive calculi are present within the collecting systems of the kidneys bilaterally, and in the lumen of the urinary bladder. 3. Large partially calcified stone in the distal common bile duct associated with proximal hepatic ductal dilatation. This appears to be chronic, and is similar to remote prior study 07/18/2014. 4. Trace volume of ascites. 5. Right renal atrophy. 6. Small left pleural effusion lying dependently. 7. Findings in the lung bases suggestive of potential interstitial lung disease. This could be further evaluated with nonemergent high-resolution chest CT and referral to Pulmonology in the near future if clinically appropriate. 8. Extensive atherosclerosis, including at least 2 vessel coronary artery disease. Additionally, the distal infrarenal abdominal aorta and proximal common iliac arteries are very diminutive in size, which could indicate chronic occlusion or high-grade stenosis. 9. Large ventral hernia in the anterior abdominal [place] containing multiple loops of small bowel, without evidence of incarceration or obstruction at this time. 10. Additional incidental findings, as above. Electronically Signed   By: Vinnie Langton M.D.   On: 08/04/2015 16:20  US Renal  08/04/2015  CLINICAL DATA:  Acute tubular necrosis. EXAM: RENAL / URINARY TRACT ULTRASOUND COMPLETE COMPARISON:  08/07/2014. FINDINGS: Right Kidney: Length: 8.3 cm. Increased echogenicity with progression. Diffuse parenchymal thinning and prominent renal sinus fat, also with progression. 6 mm calculus in the mid kidney. No hydronephrosis. Left Kidney: Length: 13.2 cm. Interval diffusely increased echogenicity. Interval mild to moderate dilatation of  the collecting system. 10 mm calculus in the kidney. Bladder: Not distended, with a suprapubic catheter in place. IMPRESSION: 1. Interval mild to moderate left hydronephrosis, most likely due to a nonvisualized ureteral calculus. 2. Bilateral nonobstructing renal calculi. 3. Interval increased echogenicity of both kidneys, compatible with medical renal disease. 4. Progressive right renal atrophy. Electronically Signed   By: Claudie Revering M.D.   On: 08/04/2015 11:14   Ir Fluoro Guide Cv Line Right  08/05/2015  INDICATION: History of chronic suprapubic catheter placement, now with obstructive uropathy and concern for impending urosepsis secondary to an approximately 1 cm stone within the superior aspect of left ureter with associated moderate severe left-sided pelvicaliectasis and worsening renal insufficiency. Request made for placement of a left-sided percutaneous nephrostomy catheter as well as a temporary dialysis catheter. EXAM: NON-TUNNELED CENTRAL VENOUS HEMODIALYSIS CATHETER PLACEMENT WITH ULTRASOUND AND FLUOROSCOPIC GUIDANCE COMPARISON:  None. MEDICATIONS: Versed 1 mg IV; Fentanyl 50 mcg IV; Ancef 2 g IV; the antibiotic was administered with an appropriate time frame prior to the initiation of the procedure. CONTRAST:  None FLUOROSCOPY TIME:  12 seconds (3.8 mGy) COMPLICATIONS: None immediate PROCEDURE: Informed written consent was obtained from the patient after a discussion of the risks, benefits, and alternatives to treatment. Questions regarding the procedure were encouraged and answered. The right neck and chest were prepped with chlorhexidine in a sterile fashion, and a sterile drape was applied covering the operative field. Maximum barrier sterile technique with sterile gowns and gloves were used for the procedure. A timeout was performed prior to the initiation of the procedure. After creating a small venotomy incision, a micropuncture kit was utilized to access the right internal jugular vein under  direct, real-time ultrasound guidance after the overlying soft tissues were anesthetized with 1% lidocaine with epinephrine. Ultrasound image documentation was performed. The microwire was kinked to measure appropriate catheter length. A stiff glidewire was advanced to the level of the IVC. Under fluoroscopic guidance, the venotomy was serially dilated, ultimately allowing placement of a 20 cm temporary Trialysis catheter with tip ultimately terminating within the superior aspect of the right atrium. Final catheter positioning was confirmed and documented with a spot radiographic image. The catheter aspirates and flushes normally. The catheter was flushed with appropriate volume heparin dwells. The catheter exit site was secured with a 0-Prolene retention suture. A dressing was placed. The patient tolerated the procedure well without immediate post procedural complication. IMPRESSION: Successful placement of a right internal jugular approach 20 cm temporary dialysis catheter with tip terminating with in the superior aspect of the right atrium. The catheter is ready for immediate use. PLAN: This catheter may be converted to a tunneled dialysis catheter at a later date as indicated. Electronically Signed   By: Sandi Mariscal M.D.   On: 08/05/2015 11:51   Ir US Guide Vasc Access Right  08/05/2015  INDICATION: History of chronic suprapubic catheter placement, now with obstructive uropathy and concern for impending urosepsis secondary to an approximately 1 cm stone within the superior aspect of left ureter with associated moderate severe left-sided pelvicaliectasis and worsening renal insufficiency. Request made for  placement of a left-sided percutaneous nephrostomy catheter as well as a temporary dialysis catheter. EXAM: NON-TUNNELED CENTRAL VENOUS HEMODIALYSIS CATHETER PLACEMENT WITH ULTRASOUND AND FLUOROSCOPIC GUIDANCE COMPARISON:  None. MEDICATIONS: Versed 1 mg IV; Fentanyl 50 mcg IV; Ancef 2 g IV; the antibiotic was  administered with an appropriate time frame prior to the initiation of the procedure. CONTRAST:  None FLUOROSCOPY TIME:  12 seconds (3.8 mGy) COMPLICATIONS: None immediate PROCEDURE: Informed written consent was obtained from the patient after a discussion of the risks, benefits, and alternatives to treatment. Questions regarding the procedure were encouraged and answered. The right neck and chest were prepped with chlorhexidine in a sterile fashion, and a sterile drape was applied covering the operative field. Maximum barrier sterile technique with sterile gowns and gloves were used for the procedure. A timeout was performed prior to the initiation of the procedure. After creating a small venotomy incision, a micropuncture kit was utilized to access the right internal jugular vein under direct, real-time ultrasound guidance after the overlying soft tissues were anesthetized with 1% lidocaine with epinephrine. Ultrasound image documentation was performed. The microwire was kinked to measure appropriate catheter length. A stiff glidewire was advanced to the level of the IVC. Under fluoroscopic guidance, the venotomy was serially dilated, ultimately allowing placement of a 20 cm temporary Trialysis catheter with tip ultimately terminating within the superior aspect of the right atrium. Final catheter positioning was confirmed and documented with a spot radiographic image. The catheter aspirates and flushes normally. The catheter was flushed with appropriate volume heparin dwells. The catheter exit site was secured with a 0-Prolene retention suture. A dressing was placed. The patient tolerated the procedure well without immediate post procedural complication. IMPRESSION: Successful placement of a right internal jugular approach 20 cm temporary dialysis catheter with tip terminating with in the superior aspect of the right atrium. The catheter is ready for immediate use. PLAN: This catheter may be converted to a  tunneled dialysis catheter at a later date as indicated. Electronically Signed   By: Sandi Mariscal M.D.   On: 08/05/2015 11:51   Ir Nephrostomy Placement Left  08/05/2015  INDICATION: History of chronic suprapubic catheter placement, now with obstructive uropathy and concern for impending urosepsis secondary to an approximately 1 cm stone within the superior aspect of left ureter with associated moderate severe left-sided pelvicaliectasis and worsening renal insufficiency. Request made for placement of a left-sided percutaneous nephrostomy catheter as well as a temporary dialysis catheter. EXAM: 1. ULTRASOUND GUIDANCE FOR PUNCTURE OF THE LEFT RENAL COLLECTING SYSTEM 2. LEFT PERCUTANEOUS NEPHROSTOMY TUBE PLACEMENT. COMPARISON:  CT abdomen pelvis - 08/04/2015 MEDICATIONS: Ancef 2 g IV; The antibiotic was administered in an appropriate time frame prior to skin puncture. ANESTHESIA/SEDATION: Fentanyl 100 mcg IV; Versed 2 mg IV Total Moderate Sedation Time 10 minutes. CONTRAST:  10 mL Isovue 300 administered into the collecting system FLUOROSCOPY TIME:  1 minutes 6 seconds (44.6 mGy) COMPLICATIONS: None immediate PROCEDURE: The procedure, risks, benefits, and alternatives were explained to the patient. Questions regarding the procedure were encouraged and answered. The patient understands and consents to the procedure. A timeout was performed prior to the initiation of the procedure. The left flank region was prepped with Betadine in a sterile fashion, and a sterile drape was applied covering the operative field. A sterile gown and sterile gloves were used for the procedure. A preprocedural spot radiographs obtained of the right mid hemi abdomen. Local anesthesia was provided with 1% Lidocaine with epinephrine. Ultrasound was used to localize  the left kidney. Under direct ultrasound guidance, a 21 gauge needle was advanced into the renal collecting system. An ultrasound image documentation was performed. Access within  the collecting system was confirmed with the efflux of urine followed by contrast injection. Over a Nitrex wire, the inner three [place] catheter of an Accustick set was advanced into the renal collecting system. Contrast injection was injected into the collecting system as several spot radiographs were obtained in various obliquities confirming puncture within a posterior inferior calix. As such, the tract was dilated with an Accustick stent. Over a guide wire, a 10-French percutaneous nephrostomy catheter was advanced into the collecting system where the coil was formed and locked. Contrast was injected and several sport radiographs were obtained in various obliquities confirming access. A small sample was aspirated from the nephrostomy, capped and sent to the laboratory for analysis. The catheter was secured at the skin with a Prolene retention suture and a gravity bag was placed. A dressing was placed. The patient tolerated procedure well without immediate postprocedural complication. FINDINGS: Preprocedural spot radiographic image demonstrates a punctate opacity overlying the expected location the superior aspect of the left ureter which correlates with the obstructing ureteral stone seen on preceding abdominal CT. Ultrasound scanning demonstrates a moderate to severely dilated left collecting system. Under direct ultrasound guidance, a posterior inferior calix was targeted allowing advancement of an 10-French percutaneous nephrostomy catheter under intermittent fluoroscopic guidance. Contrast injection confirmed appropriate positioning. A small sample of aspirated urine was capped and sent to the laboratory for analysis. IMPRESSION: Successful ultrasound and fluoroscopic guided placement of a left sided 10 French PCN. A small sample of aspirated urine was capped and sent to the laboratory for analysis. PLAN: This percutaneous access could be utilized for subsequent percutaneous nephrolithotomy as clinically  indicated. Electronically Signed   By: Sandi Mariscal M.D.   On: 08/05/2015 11:49   Medications: Infusions: . sodium chloride 125 mL/hr at 08/06/15 0616    Scheduled Medications: . antiseptic oral rinse  7 mL Mouth Rinse BID  . aspirin  81 mg Oral Daily  . carvedilol  6.25 mg Oral BID WC  . diazepam  5 mg Oral BID  . imipenem-cilastatin  250 mg Intravenous Q12H  . levothyroxine  88 mcg Oral QAC breakfast    have reviewed scheduled and prn medications.  Physical Exam: General: alert, upset about diet Heart: RRR Lungs: clear Abdomen: soft Extremities: no edema Dialysis Access: right IJ vascath placed 11/6 Perc tube with clear urine- also has SP catheter with some urine    08/06/2015,8:21 AM  LOS: 3 days

## 2015-08-07 LAB — RENAL FUNCTION PANEL
ANION GAP: 7 (ref 5–15)
Albumin: 2.2 g/dL — ABNORMAL LOW (ref 3.5–5.0)
BUN: 27 mg/dL — ABNORMAL HIGH (ref 6–20)
CALCIUM: 7.9 mg/dL — AB (ref 8.9–10.3)
CHLORIDE: 115 mmol/L — AB (ref 101–111)
CO2: 23 mmol/L (ref 22–32)
Creatinine, Ser: 2.3 mg/dL — ABNORMAL HIGH (ref 0.61–1.24)
GFR calc Af Amer: 31 mL/min — ABNORMAL LOW (ref >60)
GFR calc non Af Amer: 27 mL/min — ABNORMAL LOW (ref >60)
GLUCOSE: 102 mg/dL — AB (ref 65–99)
POTASSIUM: 3.3 mmol/L — AB (ref 3.5–5.1)
Phosphorus: 2.8 mg/dL (ref 2.5–4.6)
SODIUM: 145 mmol/L (ref 135–145)

## 2015-08-07 MED ORDER — POTASSIUM CHLORIDE CRYS ER 20 MEQ PO TBCR
40.0000 meq | EXTENDED_RELEASE_TABLET | Freq: Once | ORAL | Status: AC
  Administered 2015-08-07: 40 meq via ORAL
  Filled 2015-08-07: qty 2

## 2015-08-07 MED ORDER — LEVOTHYROXINE SODIUM 88 MCG PO TABS
88.0000 ug | ORAL_TABLET | Freq: Every day | ORAL | Status: DC
  Administered 2015-08-07 – 2015-08-09 (×3): 88 ug via ORAL
  Filled 2015-08-07 (×3): qty 1

## 2015-08-07 MED ORDER — SODIUM CHLORIDE 0.9 % IV SOLN
250.0000 mg | Freq: Three times a day (TID) | INTRAVENOUS | Status: DC
  Administered 2015-08-07 – 2015-08-08 (×5): 250 mg via INTRAVENOUS
  Filled 2015-08-07 (×7): qty 250

## 2015-08-07 NOTE — Progress Notes (Signed)
Referring Physician(s): Urology  Chief Complaint: Left hydronephrosis s/p left PCN 08/05/15  Subjective: Patient doing well but does have complaints of tenderness at PCN site.   Allergies: Tetracyclines & related and Ciprofloxacin  Medications: Prior to Admission medications   Medication Sig Start Date End Date Taking? Authorizing Provider  aspirin 81 MG chewable tablet Chew 1 tablet (81 mg total) by mouth daily. 07/26/14  Yes Verlee Monte, MD  carvedilol (COREG) 6.25 MG tablet Take 1 tablet (6.25 mg total) by mouth 2 (two) times daily with a meal. Patient taking differently: Take 6.25 mg by mouth daily.  07/26/14  Yes Verlee Monte, MD  diazepam (VALIUM) 5 MG tablet Take 1 tablet (5 mg total) by mouth 2 (two) times daily. 07/18/15  Yes Kathrynn Ducking, MD  HYDROcodone-acetaminophen ([place]) 7.5-325 MG per tablet Take 1 tablet by mouth 2 (two) times daily. Max APAP 3 GM IN 24 HOURS FROM ALL SOURCES 08/22/14  Yes Mahima Pandey, MD  levofloxacin (LEVAQUIN) 500 MG tablet Take 500 mg by mouth daily. For 14 days(started 08/02/15) 08/02/15  Yes Historical Provider, MD  levothyroxine (SYNTHROID, LEVOTHROID) 88 MCG tablet Take 88 mcg by mouth daily before breakfast.   Yes Historical Provider, MD  potassium chloride SA (K-DUR,KLOR-CON) 20 MEQ tablet Take 20 mEq by mouth daily.   Yes Rexene Alberts, MD  verapamil (CALAN) 40 MG tablet Take 40 mg by mouth 2 (two) times daily.   Yes Historical Provider, MD   Vital Signs: BP 167/72 mmHg  Pulse 63  Temp(Src) 98.4 F (36.9 C) (Oral)  Resp 18  Ht _0  (1.702 m)  Wt 121 lb 11.1 oz (55.2 kg)  BMI 19.06 kg/m2  SpO2 98%  Physical Exam General: A&Ox3, NAD Abd: Soft, NT, ND, Left PCN intact, minimal TTP, 4150 cc/24 hrs clear yellow urine  Imaging: Ct Abdomen Pelvis Wo Contrast  08/04/2015  CLINICAL DATA:  74 year old male with history of acute tubular necrosis and no urine output for the past 3 days. History of suprapubic catheter in place for  the past 3 years. EXAM: CT ABDOMEN AND PELVIS WITHOUT CONTRAST TECHNIQUE: Multidetector CT imaging of the abdomen and pelvis was performed following the standard protocol without IV contrast. COMPARISON:  CT the abdomen and pelvis 07/18/2014. FINDINGS: Lower chest: Peripheral ground-glass attenuation and subpleural reticulation in the visualize lung bases, concerning for underlying interstitial lung disease. Small left pleural effusion lying dependently. Calcifications of the aortic valve. Atherosclerotic calcifications in the right coronary artery. Atherosclerotic calcifications in the left circumflex and right coronary arteries. Hepatobiliary: No definite cystic or solid hepatic lesions are identified on today's noncontrast CT examination. Status post cholecystectomy. 16 x 10 x 7 mm partially calcified stone in the distal common bile duct (image 32 of series 2). Although poorly evaluated on today's noncontrast CT examination, there does appear to be ductal dilatation, as the common bile duct measures at least 12 mm in the porta hepatis. Pancreas: No definite pancreatic mass or peripancreatic inflammatory changes on today's noncontrast CT examination. Spleen: Unremarkable. Adrenals/Urinary Tract: 10 mm calculus at the left ureteropelvic junction associated with moderate to severe proximal hydronephrosis and perinephric stranding. Multiple additional nonobstructive calculi are present within the collecting systems of the kidneys bilaterally, the largest of which is in the lower pole of the left kidney measuring 9 mm. No additional ureteral stones are noted. 9 mm stone in the inferior aspect of the urinary bladder. Right kidney is atrophic. Multiple low-attenuation lesions in the left kidney again noted,  incompletely characterized on today's noncontrast CT examination, but similar to the prior study, likely cysts, largest of which measures 2.6 cm extending exophytic the off the lateral aspect of the upper pole of the  left kidney. Urinary bladder is nearly completely decompressed with an indwelling suprapubic catheter in position. Bilateral adrenal glands are normal in appearance. Stomach/Bowel: The unenhanced appearance of the stomach is normal. Suture line in the proximal sigmoid colon, and absence of much of the left hemicolon related to prior partial colectomy. Status post appendectomy. Vascular/Lymphatic: Extensive atherosclerosis throughout the abdominal and pelvic vasculature, without evidence of aneurysm. Notably, the distal abdominal aorta is very diminutive in size, as are the proximal common iliac arteries, which could indicate chronic occlusion. Multiple borderline enlarged upper abdominal lymph nodes, largest of which is a high left para-aortic lymph node adjacent to the left renal hilum measuring 11 mm in short axis. This is likely reactive. Reproductive: Prostate gland and seminal vesicles are unremarkable in appearance. Other: Ventral hernia in the lower abdomen containing multiple loops of small bowel, without evidence of incarceration or obstruction at this time. Trace volume of ascites. No pneumoperitoneum. Musculoskeletal: There are no aggressive appearing lytic or blastic lesions noted in the visualized portions of the skeleton. IMPRESSION: 1. Obstructive 10 mm calculus at the left ureteropelvic junction with with moderate to severe proximal hydronephrosis and perinephric stranding. 2. Multiple additional nonobstructive calculi are present within the collecting systems of the kidneys bilaterally, and in the lumen of the urinary bladder. 3. Large partially calcified stone in the distal common bile duct associated with proximal hepatic ductal dilatation. This appears to be chronic, and is similar to remote prior study 07/18/2014. 4. Trace volume of ascites. 5. Right renal atrophy. 6. Small left pleural effusion lying dependently. 7. Findings in the lung bases suggestive of potential interstitial lung disease.  This could be further evaluated with nonemergent high-resolution chest CT and referral to Pulmonology in the near future if clinically appropriate. 8. Extensive atherosclerosis, including at least 2 vessel coronary artery disease. Additionally, the distal infrarenal abdominal aorta and proximal common iliac arteries are very diminutive in size, which could indicate chronic occlusion or high-grade stenosis. 9. Large ventral hernia in the anterior abdominal [place] containing multiple loops of small bowel, without evidence of incarceration or obstruction at this time. 10. Additional incidental findings, as above. Electronically Signed   By: Vinnie Langton M.D.   On: 08/04/2015 16:20   X-ray Chest Pa And Lateral  08/03/2015  CLINICAL DATA:  74 year old male with fever EXAM: CHEST  2 VIEW COMPARISON:  Prior chest x-ray 07/18/2014 FINDINGS: Cardiac and mediastinal contours are within normal limits no focal airspace consolidation, pulmonary edema, pleural effusion or pneumothorax. Atherosclerotic calcifications are present in the transverse aorta. Mild central bronchitic change remains. No acute osseous abnormality. IMPRESSION: No active cardiopulmonary process. Electronically Signed   By: Jacqulynn Cadet M.D.   On: 08/03/2015 14:07   US Renal  08/04/2015  CLINICAL DATA:  Acute tubular necrosis. EXAM: RENAL / URINARY TRACT ULTRASOUND COMPLETE COMPARISON:  08/07/2014. FINDINGS: Right Kidney: Length: 8.3 cm. Increased echogenicity with progression. Diffuse parenchymal thinning and prominent renal sinus fat, also with progression. 6 mm calculus in the mid kidney. No hydronephrosis. Left Kidney: Length: 13.2 cm. Interval diffusely increased echogenicity. Interval mild to moderate dilatation of the collecting system. 10 mm calculus in the kidney. Bladder: Not distended, with a suprapubic catheter in place. IMPRESSION: 1. Interval mild to moderate left hydronephrosis, most likely due to a nonvisualized ureteral  calculus.  2. Bilateral nonobstructing renal calculi. 3. Interval increased echogenicity of both kidneys, compatible with medical renal disease. 4. Progressive right renal atrophy. Electronically Signed   By: Claudie Revering M.D.   On: 08/04/2015 11:14   Ir Fluoro Guide Cv Line Right  08/05/2015  INDICATION: History of chronic suprapubic catheter placement, now with obstructive uropathy and concern for impending urosepsis secondary to an approximately 1 cm stone within the superior aspect of left ureter with associated moderate severe left-sided pelvicaliectasis and worsening renal insufficiency. Request made for placement of a left-sided percutaneous nephrostomy catheter as well as a temporary dialysis catheter. EXAM: NON-TUNNELED CENTRAL VENOUS HEMODIALYSIS CATHETER PLACEMENT WITH ULTRASOUND AND FLUOROSCOPIC GUIDANCE COMPARISON:  None. MEDICATIONS: Versed 1 mg IV; Fentanyl 50 mcg IV; Ancef 2 g IV; the antibiotic was administered with an appropriate time frame prior to the initiation of the procedure. CONTRAST:  None FLUOROSCOPY TIME:  12 seconds (3.8 mGy) COMPLICATIONS: None immediate PROCEDURE: Informed written consent was obtained from the patient after a discussion of the risks, benefits, and alternatives to treatment. Questions regarding the procedure were encouraged and answered. The right neck and chest were prepped with chlorhexidine in a sterile fashion, and a sterile drape was applied covering the operative field. Maximum barrier sterile technique with sterile gowns and gloves were used for the procedure. A timeout was performed prior to the initiation of the procedure. After creating a small venotomy incision, a micropuncture kit was utilized to access the right internal jugular vein under direct, real-time ultrasound guidance after the overlying soft tissues were anesthetized with 1% lidocaine with epinephrine. Ultrasound image documentation was performed. The microwire was kinked to measure appropriate catheter  length. A stiff glidewire was advanced to the level of the IVC. Under fluoroscopic guidance, the venotomy was serially dilated, ultimately allowing placement of a 20 cm temporary Trialysis catheter with tip ultimately terminating within the superior aspect of the right atrium. Final catheter positioning was confirmed and documented with a spot radiographic image. The catheter aspirates and flushes normally. The catheter was flushed with appropriate volume heparin dwells. The catheter exit site was secured with a 0-Prolene retention suture. A dressing was placed. The patient tolerated the procedure well without immediate post procedural complication. IMPRESSION: Successful placement of a right internal jugular approach 20 cm temporary dialysis catheter with tip terminating with in the superior aspect of the right atrium. The catheter is ready for immediate use. PLAN: This catheter may be converted to a tunneled dialysis catheter at a later date as indicated. Electronically Signed   By: Sandi Mariscal M.D.   On: 08/05/2015 11:51   Ir US Guide Vasc Access Right  08/05/2015  INDICATION: History of chronic suprapubic catheter placement, now with obstructive uropathy and concern for impending urosepsis secondary to an approximately 1 cm stone within the superior aspect of left ureter with associated moderate severe left-sided pelvicaliectasis and worsening renal insufficiency. Request made for placement of a left-sided percutaneous nephrostomy catheter as well as a temporary dialysis catheter. EXAM: NON-TUNNELED CENTRAL VENOUS HEMODIALYSIS CATHETER PLACEMENT WITH ULTRASOUND AND FLUOROSCOPIC GUIDANCE COMPARISON:  None. MEDICATIONS: Versed 1 mg IV; Fentanyl 50 mcg IV; Ancef 2 g IV; the antibiotic was administered with an appropriate time frame prior to the initiation of the procedure. CONTRAST:  None FLUOROSCOPY TIME:  12 seconds (3.8 mGy) COMPLICATIONS: None immediate PROCEDURE: Informed written consent was obtained from  the patient after a discussion of the risks, benefits, and alternatives to treatment. Questions regarding the procedure were encouraged and  answered. The right neck and chest were prepped with chlorhexidine in a sterile fashion, and a sterile drape was applied covering the operative field. Maximum barrier sterile technique with sterile gowns and gloves were used for the procedure. A timeout was performed prior to the initiation of the procedure. After creating a small venotomy incision, a micropuncture kit was utilized to access the right internal jugular vein under direct, real-time ultrasound guidance after the overlying soft tissues were anesthetized with 1% lidocaine with epinephrine. Ultrasound image documentation was performed. The microwire was kinked to measure appropriate catheter length. A stiff glidewire was advanced to the level of the IVC. Under fluoroscopic guidance, the venotomy was serially dilated, ultimately allowing placement of a 20 cm temporary Trialysis catheter with tip ultimately terminating within the superior aspect of the right atrium. Final catheter positioning was confirmed and documented with a spot radiographic image. The catheter aspirates and flushes normally. The catheter was flushed with appropriate volume heparin dwells. The catheter exit site was secured with a 0-Prolene retention suture. A dressing was placed. The patient tolerated the procedure well without immediate post procedural complication. IMPRESSION: Successful placement of a right internal jugular approach 20 cm temporary dialysis catheter with tip terminating with in the superior aspect of the right atrium. The catheter is ready for immediate use. PLAN: This catheter may be converted to a tunneled dialysis catheter at a later date as indicated. Electronically Signed   By: Sandi Mariscal M.D.   On: 08/05/2015 11:51   Ir Nephrostomy Placement Left  08/05/2015  INDICATION: History of chronic suprapubic catheter  placement, now with obstructive uropathy and concern for impending urosepsis secondary to an approximately 1 cm stone within the superior aspect of left ureter with associated moderate severe left-sided pelvicaliectasis and worsening renal insufficiency. Request made for placement of a left-sided percutaneous nephrostomy catheter as well as a temporary dialysis catheter. EXAM: 1. ULTRASOUND GUIDANCE FOR PUNCTURE OF THE LEFT RENAL COLLECTING SYSTEM 2. LEFT PERCUTANEOUS NEPHROSTOMY TUBE PLACEMENT. COMPARISON:  CT abdomen pelvis - 08/04/2015 MEDICATIONS: Ancef 2 g IV; The antibiotic was administered in an appropriate time frame prior to skin puncture. ANESTHESIA/SEDATION: Fentanyl 100 mcg IV; Versed 2 mg IV Total Moderate Sedation Time 10 minutes. CONTRAST:  10 mL Isovue 300 administered into the collecting system FLUOROSCOPY TIME:  1 minutes 6 seconds (58.0 mGy) COMPLICATIONS: None immediate PROCEDURE: The procedure, risks, benefits, and alternatives were explained to the patient. Questions regarding the procedure were encouraged and answered. The patient understands and consents to the procedure. A timeout was performed prior to the initiation of the procedure. The left flank region was prepped with Betadine in a sterile fashion, and a sterile drape was applied covering the operative field. A sterile gown and sterile gloves were used for the procedure. A preprocedural spot radiographs obtained of the right mid hemi abdomen. Local anesthesia was provided with 1% Lidocaine with epinephrine. Ultrasound was used to localize the left kidney. Under direct ultrasound guidance, a 21 gauge needle was advanced into the renal collecting system. An ultrasound image documentation was performed. Access within the collecting system was confirmed with the efflux of urine followed by contrast injection. Over a Nitrex wire, the inner three [place] catheter of an Accustick set was advanced into the renal collecting system. Contrast  injection was injected into the collecting system as several spot radiographs were obtained in various obliquities confirming puncture within a posterior inferior calix. As such, the tract was dilated with an Accustick stent. Over a guide wire,  a 10-French percutaneous nephrostomy catheter was advanced into the collecting system where the coil was formed and locked. Contrast was injected and several sport radiographs were obtained in various obliquities confirming access. A small sample was aspirated from the nephrostomy, capped and sent to the laboratory for analysis. The catheter was secured at the skin with a Prolene retention suture and a gravity bag was placed. A dressing was placed. The patient tolerated procedure well without immediate postprocedural complication. FINDINGS: Preprocedural spot radiographic image demonstrates a punctate opacity overlying the expected location the superior aspect of the left ureter which correlates with the obstructing ureteral stone seen on preceding abdominal CT. Ultrasound scanning demonstrates a moderate to severely dilated left collecting system. Under direct ultrasound guidance, a posterior inferior calix was targeted allowing advancement of an 10-French percutaneous nephrostomy catheter under intermittent fluoroscopic guidance. Contrast injection confirmed appropriate positioning. A small sample of aspirated urine was capped and sent to the laboratory for analysis. IMPRESSION: Successful ultrasound and fluoroscopic guided placement of a left sided 10 French PCN. A small sample of aspirated urine was capped and sent to the laboratory for analysis. PLAN: This percutaneous access could be utilized for subsequent percutaneous nephrolithotomy as clinically indicated. Electronically Signed   By: Sandi Mariscal M.D.   On: 08/05/2015 11:49    Labs:  CBC:  Recent Labs  08/03/15 1207 08/04/15 0634 08/05/15 0207 08/06/15 0319  WBC 23.6* 14.4* 8.8 7.9  HGB 14.6 12.2* 10.2*  11.0*  HCT 43.1 37.2* 31.3* 33.5*  PLT 118* 93* 81* 80*    COAGS:  Recent Labs  08/04/15 1810  INR 1.25  APTT 36    BMP:  Recent Labs  08/04/15 2348 08/05/15 0207 08/06/15 0319 08/07/15 0341  NA 137 144 145 145  K 4.8 5.3* 4.5 3.3*  CL 104 111 118* 115*  CO2 20* 20* 18* 23  GLUCOSE 107* 103* 104* 102*  BUN 70* 70* 52* 27*  CALCIUM 8.4* 8.3* 8.0* 7.9*  CREATININE 7.36* 7.81* 4.73* 2.30*  GFRNONAA 7* 6* 11* 27*  GFRAA 8* 7* 13* 31*    LIVER FUNCTION TESTS:  Recent Labs  08/03/15 1207 08/05/15 0207 08/06/15 0319 08/07/15 0341  BILITOT 1.1 0.4 0.7  --   AST 22 10* 17  --   ALT 15* 9* 10*  --   ALKPHOS 112 71 69  --   PROT 7.7 5.4* 5.6*  --   ALBUMIN 3.9 2.3* 2.4* 2.2*    Assessment and Plan: UTI possible urosepsis with chronic suprapubic catheter in place Now left ureteropelvic junction calculus with left hydronephrosis S/p left PCN 08/05/15 Acute renal failure Right atrophic kidney  Good left PCN output clear yellow urine, Cr trending down 2.3 (4.7), wbc wnl, afebrile, H/H stable Plans per urology, if no urological intervention within 6 weeks will need routine PCN exchange    Signed: Hedy Jacob 08/07/2015, 1:33 PM   I spent a total of 15 Minutes at the the patient's bedside AND on the patient's hospital floor or unit, greater than 50% of which was counseling/coordinating care for left hydronephrosis

## 2015-08-07 NOTE — Progress Notes (Signed)
ANTIBIOTIC CONSULT NOTE - FOLLOW UP  Pharmacy Consult for Primaxin Indication: Urosepsis.  Allergies  Allergen Reactions  . Tetracyclines & Related Anaphylaxis and Rash  . Ciprofloxacin     Trouble swallowing    Patient Measurements: Height: 5\' 7"  (170.2 cm) Weight: 121 lb 11.1 oz (55.2 kg) IBW/kg (Calculated) : 66.1 Adjusted Body Weight:    Vital Signs: Temp: 98.4 F (36.9 C) (11/08 1223) Temp Source: Oral (11/08 1223) BP: 167/72 mmHg (11/08 1223) Pulse Rate: 63 (11/08 1223) Intake/Output from previous day: 11/07 0701 - 11/08 0700 In: 3869.6 [P.O.:1100; I.V.:2564.6; IV Piggyback:200] Out: 4251 [Urine:4250; Stool:1] Intake/Output from this shift: Total I/O In: 467 [P.O.:462; Other:5] Out: [id number]  Labs:  Recent Labs  08/04/15 2146  08/05/15 0207 08/06/15 0319 08/07/15 0341  WBC  --   --  8.8 7.9  --   HGB  --   --  10.2* 11.0*  --   PLT  --   --  81* 80*  --   LABCREA <10  --   --   --   --   CREATININE  --   < > 7.81* 4.73* 2.30*  < > = values in this interval not displayed. Estimated Creatinine Clearance: 22.7 mL/min (by C-G formula based on Cr of 2.3). No results for input(s): VANCOTROUGH, VANCOPEAK, VANCORANDOM, GENTTROUGH, GENTPEAK, GENTRANDOM, TOBRATROUGH, TOBRAPEAK, TOBRARND, AMIKACINPEAK, AMIKACINTROU, AMIKACIN in the last 72 hours.   Assessment: 74yo male admitted to [facility] 11/4 with UTI, decreased urine output after failing outpt treatment. Pt has a suprapubic catheter.  He has a hx of pseudomonas UTI resistant to Cefepime and is to start Primaxin.  He presents with AKI on CKD3, with probable progression of CKD  -Cr 7.81- trending up (4.44 on 11/4), CrCl 7.22ml/min -WBC 8.8 (23.6 on 11/4)  Infectious Disease-  Primaxin-Rx for UTI, hx PSA resistant to cefepime. (+)hx CDiff colitis.   AFeb, WBC 7.9 (23.6 on 11/4).  (+)suprapubic cath & hx multiple UTIs.    Levofloxacin PO 500mg  qday 11/3-11/4 (pta) Levofloxacin IV 500mg  q48  11/4-11/6  ([facility]) Ceftriaxone 1gm q24 11/5-11/6 ([facility]) Primaxin 11/6 >>  11/5  Blood cx x 2:  ntd 11/5  Urine cx: Multiple species. Final. 11/6: Suprapubic urine>>   Goal of Therapy:  Eradication of infection   Plan:  Primaxin 250mg  IV increase to q8hrs   Zephan Beauchaine S. Alford Highland, PharmD, BCPS Clinical Staff Pharmacist Pager [phone]  Eilene Ghazi Stillinger 08/07/2015,2:19 PM

## 2015-08-07 NOTE — Progress Notes (Signed)
PROGRESS NOTE  Jordan Ward [id number] DOB: 1941/09/03 DOA: 08/03/2015 PCP: [name], MD   HPI: 74 yo M admitted to [facility] on 11/4 with decreased UOP, abdominal pain, found to be in renal failure due to obstructive stone and left renal hydronephrosis, transferred to [facility] on 11/5 for IR evaluation. Nephrology, urology, IR were consulted and patient underwent percutaneous nephrostomy on 11/6 and a HD cath in case dialysis is needed. His renal function improved well post tube placement without need for HD. He has postobstructive diuresis.  Subjective / 24 H Interval events - pain improved, feels good - good UOP overnight  Assessment/Plan: Principal Problem:   Hydronephrosis with obstructing calculus Active Problems:   Hyperkalemia   Junctional rhythm   Multiple sclerosis (HCC)   UTI (lower urinary tract infection)   HTN (hypertension), malignant   Chronic indwelling Foley catheter   Pulmonary fibrosis (HCC)   Hypothyroidism   Dilated cardiomyopathy (HCC)   Acute renal failure superimposed on stage 3 chronic kidney disease (HCC)   Chronic systolic congestive heart failure (HCC)   Acute hemodialysis patient ([place])   Essential hypertension   Hydronephrosis with obstructing calculus  - As evidenced by CT-abd/pelvis - s/p percutaneous nephrostomy tube 11/6 bu IR - nephrology and urology following.  - monitor for post obstructive diuresis, decrease IVF today to 75 cc/h, based on overnight UOP may be able to d/c fluids tomorrow.   UTI - has positive UA and leukocytosis. Not septic on admission, prior microbiology with Pseudomonas resistant to Cefepime, started on imipenem - urine culture final without a specific agent identified. Received levofloxacin prior to this culture - clinically he responded to Levofloxacin, will narrow to that on discharge  Hyperkalemia >> hypokalemia - due to renal failure - K normalized with UOP, now hypokalemic requiring repletion - continue to  monitor  Multiple sclerosis (HCC)  - stable.   HTN:  - on Coreg and Verapamil  Hypothyroidism - Continue home Synthroid - TSH normal  Chronic systolic congestive heart failure  - Echo on 07/18/14 showed EF of 30-35%. Patient does not have leg edema, CHF is compensated. He is not taking diuretics at home - watch fluid status closely as currently is on normal saline.   Acute renal failure superimposed on stage 3 chronic kidney disease (HCC) - due to #1 - improving post nephrostomy tube placement  Nephrolithiasis - per urology     Diet: DIET DYS 3 Room service appropriate?: Yes; Fluid consistency:: Nectar Thick Fluids: NS 75 cc/h DVT Prophylaxis: SCD  Code Status: Partial Code Family Communication: no family bedside  Disposition Plan: home 1-2 days once K stable and UOP slows down  Consultants:  Urology  Nephrology  IR  Procedures:  Percutaneous nephrostomy 11/6  HD cath 11/6   Antibiotics Primaxin 11/6 >> Levofloxacin 11/4 >>11/5 Ceftriaxone 11/5 >> 11/6   Studies  No results found.  Objective  Filed Vitals:   08/06/15 1600 08/06/15 1946 08/07/15 0506 08/07/15 0744  BP: 133/73 136/68 131/69 150/70  Pulse: 64 61 57 58  Temp:  98.7 F (37.1 C) 98.1 F (36.7 C) 98.7 F (37.1 C)  TempSrc:  Oral Oral Oral  Resp:  18 18 18   Height:      Weight:   55.2 kg (121 lb 11.1 oz)   SpO2:  94% 98% 96%    Intake/Output Summary (Last 24 hours) at 08/07/15 1127 Last data filed at 08/07/15 1013  Gross per 24 hour  Intake 3846.58 ml  Output  4921 ml  Net -1074.42 ml   Filed Weights   08/06/15 0500 08/06/15 0939 08/07/15 0506  Weight: 61.9 kg (136 lb 7.4 oz) 54.522 kg (120 lb 3.2 oz) 55.2 kg (121 lb 11.1 oz)    Exam:  GENERAL: NAD  HEENT: head NCAT, no scleral icterus.   NECK: Supple.   LUNGS: Clear to auscultation. No wheezing or crackles  HEART: Regular rate and rhythm without murmur. 2+ pulses, no JVD, no peripheral edema  ABDOMEN: Soft,  non-distended, tender to palpation left upper and lower quadrants  EXTREMITIES: Without any cyanosis or clubbing. Good muscle tone  NEUROLOGIC: Alert and oriented x3. Cranial nerves II through XII are grossly intact. Strength 5/5 in all 4.  Data Reviewed: Basic Metabolic Panel:  Recent Labs Lab 08/04/15 0634 08/04/15 2348 08/05/15 0207 08/06/15 0319 08/07/15 0341  NA 137 137 144 145 145  K 5.8* 4.8 5.3* 4.5 3.3*  CL 106 104 111 118* 115*  CO2 21* 20* 20* 18* 23  GLUCOSE 117* 107* 103* 104* 102*  BUN 61* 70* 70* 52* 27*  CREATININE 6.37* 7.36* 7.81* 4.73* 2.30*  CALCIUM 8.4* 8.4* 8.3* 8.0* 7.9*  PHOS  --   --   --   --  2.8   Liver Function Tests:  Recent Labs Lab 08/03/15 1207 08/05/15 0207 08/06/15 0319 08/07/15 0341  AST 22 10* 17  --   ALT 15* 9* 10*  --   ALKPHOS 112 71 69  --   BILITOT 1.1 0.4 0.7  --   PROT 7.7 5.4* 5.6*  --   ALBUMIN 3.9 2.3* 2.4* 2.2*   CBC:  Recent Labs Lab 08/03/15 1207 08/04/15 0634 08/05/15 0207 08/06/15 0319  WBC 23.6* 14.4* 8.8 7.9  NEUTROABS 20.0*  --   --   --   HGB 14.6 12.2* 10.2* 11.0*  HCT 43.1 37.2* 31.3* 33.5*  MCV 91.3 92.5 91.5 93.6  PLT 118* 93* 81* 80*   BNP (last 3 results)  Recent Labs  08/05/15 0207  BNP 1190.1*   Recent Results (from the past 240 hour(s))  MRSA PCR Screening     Status: None   Collection Time: 08/04/15  9:18 PM  Result Value Ref Range Status   MRSA by PCR NEGATIVE NEGATIVE Final    Comment:        The GeneXpert MRSA Assay (FDA approved for NASAL specimens only), is one component of a comprehensive MRSA colonization surveillance program. It is not intended to diagnose MRSA infection nor to guide or monitor treatment for MRSA infections.   Urine culture     Status: None   Collection Time: 08/04/15  9:45 PM  Result Value Ref Range Status   Specimen Description URINE, CLEAN CATCH  Final   Special Requests cipro  Final   Culture MULTIPLE SPECIES PRESENT, SUGGEST RECOLLECTION   Final   Report Status 08/06/2015 FINAL  Final  Culture, blood (routine x 2)     Status: None (Preliminary result)   Collection Time: 08/04/15 11:30 PM  Result Value Ref Range Status   Specimen Description BLOOD LEFT ARM  Final   Special Requests BOTTLES DRAWN AEROBIC AND ANAEROBIC 10CC  Final   Culture NO GROWTH 1 DAY  Final   Report Status PENDING  Incomplete  Culture, blood (routine x 2)     Status: None (Preliminary result)   Collection Time: 08/04/15 11:48 PM  Result Value Ref Range Status   Specimen Description BLOOD LEFT HAND  Final   Special  Requests IN PEDIATRIC BOTTLE 1CC  Final   Culture NO GROWTH 1 DAY  Final   Report Status PENDING  Incomplete  Urine culture     Status: None   Collection Time: 08/05/15 11:51 AM  Result Value Ref Range Status   Specimen Description URINE, SUPRAPUBIC  Final   Special Requests Normal  Final   Culture NO GROWTH 1 DAY  Final   Report Status 08/06/2015 FINAL  Final     Scheduled Meds: . antiseptic oral rinse  7 mL Mouth Rinse BID  . aspirin  81 mg Oral Daily  . carvedilol  6.25 mg Oral BID WC  . diazepam  5 mg Oral BID  . imipenem-cilastatin  250 mg Intravenous Q12H  . levothyroxine  88 mcg Oral QAC breakfast  . verapamil  40 mg Oral Q12H   Continuous Infusions: . sodium chloride 125 mL/hr at 08/07/15 0806    Marzetta Board, MD [facility] Pager [phone]. If 7 PM - 7 AM, please contact night-coverage at [email or web address], password [facility] 08/07/2015, 11:27 AM  LOS: 4 days

## 2015-08-07 NOTE — Progress Notes (Signed)
Subjective:  UOP great- over 4 liters creatinine down a lot again- BP fine Objective Vital signs in last 24 hours: Filed Vitals:   08/06/15 1600 08/06/15 1946 08/07/15 0506 08/07/15 0744  BP: 133/73 136/68 131/69 150/70  Pulse: 64 61 57 58  Temp:  98.7 F (37.1 C) 98.1 F (36.7 C) 98.7 F (37.1 C)  TempSrc:  Oral Oral Oral  Resp:  '18 18 18  ' Height:      Weight:   55.2 kg (121 lb 11.1 oz)   SpO2:  94% 98% 96%   Weight change: -7.378 kg (-16 lb 4.2 oz)  Intake/Output Summary (Last 24 hours) at 08/07/15 0845 Last data filed at 08/07/15 0810  Gross per 24 hour  Intake 3874.58 ml  Output   5071 ml  Net -1196.42 ml    Assessment/ Plan: Pt is a 74 y.o. yo male who was admitted on 08/03/2015 with A on CRF in the setting of an obstructed solitary functioning kidney Assessment/Plan: 1. AKI- in the setting of an obstructed solitary functioning kidney with stone.  After perc tube kidney function much better- anticipate continued improvement.  Dialysis is not needed- could remove HD catheter 2. HTN/volume- on IVF to keep up with post obstructive diuresis urinary losses  3. Anemia- not too much of an issue at this time 4. obstructing stone- per urology - definitive procedure pending- s/p perc tube 5. ID- WBC much better- ancef/primaxin   Patient is out of woods as far as need for HD- kidney function improving nicely with obstruction relieved- anticipate continued improvement- renal will sign off- call with questions   Patt Steinhardt A    Labs: Basic Metabolic Panel:  Recent Labs Lab 08/05/15 0207 08/06/15 0319 08/07/15 0341  NA 144 145 145  K 5.3* 4.5 3.3*  CL 111 118* 115*  CO2 20* 18* 23  GLUCOSE 103* 104* 102*  BUN 70* 52* 27*  CREATININE 7.81* 4.73* 2.30*  CALCIUM 8.3* 8.0* 7.9*  PHOS  --   --  2.8   Liver Function Tests:  Recent Labs Lab 08/03/15 1207 08/05/15 0207 08/06/15 0319 08/07/15 0341  AST 22 10* 17  --   ALT 15* 9* 10*  --   ALKPHOS 112 71 69   --   BILITOT 1.1 0.4 0.7  --   PROT 7.7 5.4* 5.6*  --   ALBUMIN 3.9 2.3* 2.4* 2.2*   No results for input(s): LIPASE, AMYLASE in the last 168 hours. No results for input(s): AMMONIA in the last 168 hours. CBC:  Recent Labs Lab 08/03/15 1207 08/04/15 0634 08/05/15 0207 08/06/15 0319  WBC 23.6* 14.4* 8.8 7.9  NEUTROABS 20.0*  --   --   --   HGB 14.6 12.2* 10.2* 11.0*  HCT 43.1 37.2* 31.3* 33.5*  MCV 91.3 92.5 91.5 93.6  PLT 118* 93* 81* 80*   Cardiac Enzymes: No results for input(s): CKTOTAL, CKMB, CKMBINDEX, TROPONINI in the last 168 hours. CBG: No results for input(s): GLUCAP in the last 168 hours.  Iron Studies: No results for input(s): IRON, TIBC, TRANSFERRIN, FERRITIN in the last 72 hours. Studies/Results: Ir Fluoro Guide Cv Line Right  08/05/2015  INDICATION: History of chronic suprapubic catheter placement, now with obstructive uropathy and concern for impending urosepsis secondary to an approximately 1 cm stone within the superior aspect of left ureter with associated moderate severe left-sided pelvicaliectasis and worsening renal insufficiency. Request made for placement of a left-sided percutaneous nephrostomy catheter as well as a temporary dialysis catheter. EXAM:  NON-TUNNELED CENTRAL VENOUS HEMODIALYSIS CATHETER PLACEMENT WITH ULTRASOUND AND FLUOROSCOPIC GUIDANCE COMPARISON:  None. MEDICATIONS: Versed 1 mg IV; Fentanyl 50 mcg IV; Ancef 2 g IV; the antibiotic was administered with an appropriate time frame prior to the initiation of the procedure. CONTRAST:  None FLUOROSCOPY TIME:  12 seconds (3.8 mGy) COMPLICATIONS: None immediate PROCEDURE: Informed written consent was obtained from the patient after a discussion of the risks, benefits, and alternatives to treatment. Questions regarding the procedure were encouraged and answered. The right neck and chest were prepped with chlorhexidine in a sterile fashion, and a sterile drape was applied covering the operative field.  Maximum barrier sterile technique with sterile gowns and gloves were used for the procedure. A timeout was performed prior to the initiation of the procedure. After creating a small venotomy incision, a micropuncture kit was utilized to access the right internal jugular vein under direct, real-time ultrasound guidance after the overlying soft tissues were anesthetized with 1% lidocaine with epinephrine. Ultrasound image documentation was performed. The microwire was kinked to measure appropriate catheter length. A stiff glidewire was advanced to the level of the IVC. Under fluoroscopic guidance, the venotomy was serially dilated, ultimately allowing placement of a 20 cm temporary Trialysis catheter with tip ultimately terminating within the superior aspect of the right atrium. Final catheter positioning was confirmed and documented with a spot radiographic image. The catheter aspirates and flushes normally. The catheter was flushed with appropriate volume heparin dwells. The catheter exit site was secured with a 0-Prolene retention suture. A dressing was placed. The patient tolerated the procedure well without immediate post procedural complication. IMPRESSION: Successful placement of a right internal jugular approach 20 cm temporary dialysis catheter with tip terminating with in the superior aspect of the right atrium. The catheter is ready for immediate use. PLAN: This catheter may be converted to a tunneled dialysis catheter at a later date as indicated. Electronically Signed   By: Sandi Mariscal M.D.   On: 08/05/2015 11:51   Ir US Guide Vasc Access Right  08/05/2015  INDICATION: History of chronic suprapubic catheter placement, now with obstructive uropathy and concern for impending urosepsis secondary to an approximately 1 cm stone within the superior aspect of left ureter with associated moderate severe left-sided pelvicaliectasis and worsening renal insufficiency. Request made for placement of a left-sided  percutaneous nephrostomy catheter as well as a temporary dialysis catheter. EXAM: NON-TUNNELED CENTRAL VENOUS HEMODIALYSIS CATHETER PLACEMENT WITH ULTRASOUND AND FLUOROSCOPIC GUIDANCE COMPARISON:  None. MEDICATIONS: Versed 1 mg IV; Fentanyl 50 mcg IV; Ancef 2 g IV; the antibiotic was administered with an appropriate time frame prior to the initiation of the procedure. CONTRAST:  None FLUOROSCOPY TIME:  12 seconds (3.8 mGy) COMPLICATIONS: None immediate PROCEDURE: Informed written consent was obtained from the patient after a discussion of the risks, benefits, and alternatives to treatment. Questions regarding the procedure were encouraged and answered. The right neck and chest were prepped with chlorhexidine in a sterile fashion, and a sterile drape was applied covering the operative field. Maximum barrier sterile technique with sterile gowns and gloves were used for the procedure. A timeout was performed prior to the initiation of the procedure. After creating a small venotomy incision, a micropuncture kit was utilized to access the right internal jugular vein under direct, real-time ultrasound guidance after the overlying soft tissues were anesthetized with 1% lidocaine with epinephrine. Ultrasound image documentation was performed. The microwire was kinked to measure appropriate catheter length. A stiff glidewire was advanced to the level of  the IVC. Under fluoroscopic guidance, the venotomy was serially dilated, ultimately allowing placement of a 20 cm temporary Trialysis catheter with tip ultimately terminating within the superior aspect of the right atrium. Final catheter positioning was confirmed and documented with a spot radiographic image. The catheter aspirates and flushes normally. The catheter was flushed with appropriate volume heparin dwells. The catheter exit site was secured with a 0-Prolene retention suture. A dressing was placed. The patient tolerated the procedure well without immediate post  procedural complication. IMPRESSION: Successful placement of a right internal jugular approach 20 cm temporary dialysis catheter with tip terminating with in the superior aspect of the right atrium. The catheter is ready for immediate use. PLAN: This catheter may be converted to a tunneled dialysis catheter at a later date as indicated. Electronically Signed   By: Sandi Mariscal M.D.   On: 08/05/2015 11:51   Ir Nephrostomy Placement Left  08/05/2015  INDICATION: History of chronic suprapubic catheter placement, now with obstructive uropathy and concern for impending urosepsis secondary to an approximately 1 cm stone within the superior aspect of left ureter with associated moderate severe left-sided pelvicaliectasis and worsening renal insufficiency. Request made for placement of a left-sided percutaneous nephrostomy catheter as well as a temporary dialysis catheter. EXAM: 1. ULTRASOUND GUIDANCE FOR PUNCTURE OF THE LEFT RENAL COLLECTING SYSTEM 2. LEFT PERCUTANEOUS NEPHROSTOMY TUBE PLACEMENT. COMPARISON:  CT abdomen pelvis - 08/04/2015 MEDICATIONS: Ancef 2 g IV; The antibiotic was administered in an appropriate time frame prior to skin puncture. ANESTHESIA/SEDATION: Fentanyl 100 mcg IV; Versed 2 mg IV Total Moderate Sedation Time 10 minutes. CONTRAST:  10 mL Isovue 300 administered into the collecting system FLUOROSCOPY TIME:  1 minutes 6 seconds (97.9 mGy) COMPLICATIONS: None immediate PROCEDURE: The procedure, risks, benefits, and alternatives were explained to the patient. Questions regarding the procedure were encouraged and answered. The patient understands and consents to the procedure. A timeout was performed prior to the initiation of the procedure. The left flank region was prepped with Betadine in a sterile fashion, and a sterile drape was applied covering the operative field. A sterile gown and sterile gloves were used for the procedure. A preprocedural spot radiographs obtained of the right mid hemi  abdomen. Local anesthesia was provided with 1% Lidocaine with epinephrine. Ultrasound was used to localize the left kidney. Under direct ultrasound guidance, a 21 gauge needle was advanced into the renal collecting system. An ultrasound image documentation was performed. Access within the collecting system was confirmed with the efflux of urine followed by contrast injection. Over a Nitrex wire, the inner three [place] catheter of an Accustick set was advanced into the renal collecting system. Contrast injection was injected into the collecting system as several spot radiographs were obtained in various obliquities confirming puncture within a posterior inferior calix. As such, the tract was dilated with an Accustick stent. Over a guide wire, a 10-French percutaneous nephrostomy catheter was advanced into the collecting system where the coil was formed and locked. Contrast was injected and several sport radiographs were obtained in various obliquities confirming access. A small sample was aspirated from the nephrostomy, capped and sent to the laboratory for analysis. The catheter was secured at the skin with a Prolene retention suture and a gravity bag was placed. A dressing was placed. The patient tolerated procedure well without immediate postprocedural complication. FINDINGS: Preprocedural spot radiographic image demonstrates a punctate opacity overlying the expected location the superior aspect of the left ureter which correlates with the obstructing ureteral stone seen  on preceding abdominal CT. Ultrasound scanning demonstrates a moderate to severely dilated left collecting system. Under direct ultrasound guidance, a posterior inferior calix was targeted allowing advancement of an 10-French percutaneous nephrostomy catheter under intermittent fluoroscopic guidance. Contrast injection confirmed appropriate positioning. A small sample of aspirated urine was capped and sent to the laboratory for analysis.  IMPRESSION: Successful ultrasound and fluoroscopic guided placement of a left sided 10 French PCN. A small sample of aspirated urine was capped and sent to the laboratory for analysis. PLAN: This percutaneous access could be utilized for subsequent percutaneous nephrolithotomy as clinically indicated. Electronically Signed   By: Sandi Mariscal M.D.   On: 08/05/2015 11:49   Medications: Infusions: . sodium chloride 125 mL/hr at 08/07/15 0806    Scheduled Medications: . antiseptic oral rinse  7 mL Mouth Rinse BID  . aspirin  81 mg Oral Daily  . carvedilol  6.25 mg Oral BID WC  . diazepam  5 mg Oral BID  . imipenem-cilastatin  250 mg Intravenous Q12H  . levothyroxine  88 mcg Oral QAC breakfast  . verapamil  40 mg Oral Q12H    have reviewed scheduled and prn medications.  Physical Exam: General: alert, upset about diet Heart: RRR Lungs: clear Abdomen: soft Extremities: no edema Dialysis Access: right IJ vascath placed 11/6 Perc tube with clear urine- also has SP catheter with some urine    08/07/2015,8:45 AM  LOS: 4 days

## 2015-08-08 DIAGNOSIS — I10 Essential (primary) hypertension: Secondary | ICD-10-CM

## 2015-08-08 DIAGNOSIS — I420 Dilated cardiomyopathy: Secondary | ICD-10-CM

## 2015-08-08 DIAGNOSIS — N179 Acute kidney failure, unspecified: Principal | ICD-10-CM

## 2015-08-08 DIAGNOSIS — N132 Hydronephrosis with renal and ureteral calculous obstruction: Secondary | ICD-10-CM

## 2015-08-08 DIAGNOSIS — N390 Urinary tract infection, site not specified: Secondary | ICD-10-CM

## 2015-08-08 DIAGNOSIS — E876 Hypokalemia: Secondary | ICD-10-CM

## 2015-08-08 DIAGNOSIS — I5022 Chronic systolic (congestive) heart failure: Secondary | ICD-10-CM

## 2015-08-08 LAB — RENAL FUNCTION PANEL
ANION GAP: 8 (ref 5–15)
Albumin: 2.5 g/dL — ABNORMAL LOW (ref 3.5–5.0)
BUN: 19 mg/dL (ref 6–20)
CO2: 24 mmol/L (ref 22–32)
CREATININE: 1.47 mg/dL — AB (ref 0.61–1.24)
Calcium: 8 mg/dL — ABNORMAL LOW (ref 8.9–10.3)
Chloride: 108 mmol/L (ref 101–111)
GFR calc non Af Amer: 46 mL/min — ABNORMAL LOW (ref >60)
GFR, EST AFRICAN AMERICAN: 53 mL/min — AB (ref >60)
Glucose, Bld: 109 mg/dL — ABNORMAL HIGH (ref 65–99)
PHOSPHORUS: 2 mg/dL — AB (ref 2.5–4.6)
POTASSIUM: 2.7 mmol/L — AB (ref 3.5–5.1)
Sodium: 140 mmol/L (ref 135–145)

## 2015-08-08 LAB — CBC
HCT: 36.3 % — ABNORMAL LOW (ref 39.0–52.0)
Hemoglobin: 12.2 g/dL — ABNORMAL LOW (ref 13.0–17.0)
MCH: 29.8 pg (ref 26.0–34.0)
MCHC: 33.6 g/dL (ref 30.0–36.0)
MCV: 88.8 fL (ref 78.0–100.0)
PLATELETS: 109 10*3/uL — AB (ref 150–400)
RBC: 4.09 MIL/uL — ABNORMAL LOW (ref 4.22–5.81)
RDW: 13.6 % (ref 11.5–15.5)
WBC: 10 10*3/uL (ref 4.0–10.5)

## 2015-08-08 LAB — MAGNESIUM: Magnesium: 1.3 mg/dL — ABNORMAL LOW (ref 1.7–2.4)

## 2015-08-08 MED ORDER — SODIUM BICARBONATE 8.4 % IV SOLN
INTRAVENOUS | Status: DC
  Filled 2015-08-08: qty 1000

## 2015-08-08 MED ORDER — POTASSIUM CHLORIDE 10 MEQ/100ML IV SOLN
10.0000 meq | INTRAVENOUS | Status: AC
  Administered 2015-08-08 (×3): 10 meq via INTRAVENOUS
  Filled 2015-08-08 (×3): qty 100

## 2015-08-08 MED ORDER — POTASSIUM CHLORIDE CRYS ER 20 MEQ PO TBCR
40.0000 meq | EXTENDED_RELEASE_TABLET | Freq: Three times a day (TID) | ORAL | Status: AC
  Administered 2015-08-08 (×3): 40 meq via ORAL
  Filled 2015-08-08 (×3): qty 2

## 2015-08-08 MED ORDER — DEXTROSE 5 % IV SOLN
INTRAVENOUS | Status: DC
  Administered 2015-08-08 (×2): via INTRAVENOUS
  Filled 2015-08-08 (×3): qty 150

## 2015-08-08 NOTE — Progress Notes (Signed)
[facility] PROGRESS NOTE    Progress Note   Jordan Ward [id number] DOB: [birth year] DOA: 08/03/2015 PCP: [name], MD   Brief Narrative:   Jordan Ward is an 74 y.o. male came in to the hospital with decreased urine output and abdominal pain found to be in acute renal failure due to obstructive uropathy and left renal hydronephrosis nephrology urology and INR were consulted the patient underwent nephrostomy placement on 08/05/2015 and HD catheter placement. His renal function has improved significantly  Assessment/Plan:   AKI due to obstructive nephropathy/ Hydronephrosis with obstructing calculus: - CT of the abdomen and pelvis on 08/04/2015 showed a 10 mm left calculus, urology was consulted who recommended, percutaneous tube placement, which was done on 08/05/2015 by IR. - Nephrology was also consulted who recommended to continue IV fluids, dialysis is not needed as there has been a significant improvement in his renal function. There also recommended to remove a she catheter.  Possible UTI: Was on empiric antibiotics prior to urine culture will complete course. Continue IV Primaxin 1 he's able to go home the de-escalate treatment.  Hyperkalemia>> hypokalemia: Due to renal dysfunction potassium is normalized with improved mental in urine output. We will add potassium to his fluids. Continue oral repletion recheck a basic metabolic panel in the morning.  Junctional rhythm  Multiple sclerosis (HCC) Stable.  HTN (hypertension), malignant/Essential hypertension: Continue verapamil and Coreg.  Chronic indwelling Foley catheter  Hypothyroidism Continue Synthroid.  Dilated cardiomyopathy (HCC)/  Chronic systolic congestive heart failure ([place]): - 2-D echo showed an EF of 30%. We'll continue to monitor fluid status.  DVT Prophylaxis - Lovenox ordered.  Family Communication: none Disposition Plan: Home when stable. Code Status:     Code Status Orders        Start     Ordered   08/04/15 2338  Limited resuscitation (code)   Continuous    Question Answer Comment  In the event of cardiac or respiratory ARREST: Perform CPR Yes   In the event of cardiac or respiratory ARREST: Perform Intubation/Mechanical Ventilation No   In the event of cardiac or respiratory ARREST: Perform Defibrillation or Cardioversion if indicated Yes   Antiarrhythmic drugs - Any drug used to treat arrhythmias Yes   Vasoactive drug - Any drug used to stabilize blood pressure Yes      08/04/15 2337        IV Access:    Peripheral IV   Procedures and diagnostic studies:   No results found.   Medical Consultants:    None.  Anti-Infectives:   Anti-infectives    Start     Dose/Rate Route Frequency Ordered Stop   08/07/15 1800  imipenem-cilastatin (PRIMAXIN) 250 mg in sodium chloride 0.9 % 100 mL IVPB     250 mg 200 mL/hr over 30 Minutes Intravenous Every 8 hours 08/07/15 1415     08/05/15 1200  levofloxacin (LEVAQUIN) IVPB 500 mg  Status:  Discontinued     500 mg 100 mL/hr over 60 Minutes Intravenous Every 48 hours 08/04/15 0750 08/05/15 0709   08/05/15 1000  imipenem-cilastatin (PRIMAXIN) 250 mg in sodium chloride 0.9 % 100 mL IVPB  Status:  Discontinued     250 mg 200 mL/hr over 30 Minutes Intravenous Every 12 hours 08/05/15 0806 08/07/15 1415   08/05/15 1000  ceFAZolin (ANCEF) IVPB 2 g/50 mL premix     2 g 100 mL/hr over 30 Minutes Intravenous  Once 08/05/15 0953 08/05/15 1214   08/05/15 0940  ceFAZolin (  ANCEF) 2-3 GM-% IVPB SOLR    Comments:  Laughlin, Amy   : cabinet override      08/05/15 0940 08/05/15 2144   08/04/15 2300  cefTRIAXone (ROCEPHIN) 1 g in dextrose 5 % 50 mL IVPB  Status:  Discontinued     1 g 100 mL/hr over 30 Minutes Intravenous Daily at bedtime 08/04/15 2235 08/05/15 0709   08/03/15 1200  levofloxacin (LEVAQUIN) IVPB 500 mg  Status:  Discontinued     500 mg 100 mL/hr over 60 Minutes Intravenous Every 24 hours 08/03/15 1136  08/04/15 0750      Subjective:    Jordan Ward [place] has no new complains.  Objective:    Filed Vitals:   08/07/15 1439 08/07/15 1705 08/07/15 2027 08/08/15 0610  BP: 143/73 159/85 151/89 160/92  Pulse: 65 63 63 68  Temp: 98.2 F (36.8 C) 98.3 F (36.8 C) 99.3 F (37.4 C) 97.7 F (36.5 C)  TempSrc: Oral Oral Oral Oral  Resp: 18 18 18 18   Height:      Weight:    57.398 kg (126 lb 8.6 oz)  SpO2: 98% 94% 96% 95%    Intake/Output Summary (Last 24 hours) at 08/08/15 0816 Last data filed at 08/08/15 0600  Gross per 24 hour  Intake 3025.75 ml  Output   4410 ml  Net -1384.25 ml   Filed Weights   08/06/15 0939 08/07/15 0506 08/08/15 0610  Weight: 54.522 kg (120 lb 3.2 oz) 55.2 kg (121 lb 11.1 oz) 57.398 kg (126 lb 8.6 oz)    Exam: Gen:  NAD Cardiovascular:  RRR, No M/R/G Chest and lungs:   CTAB Abdomen:  Abdomen soft, NT/ND, + BS Extremities:  No C/E/C   Data Reviewed:    Labs: Basic Metabolic Panel:  Recent Labs Lab 08/04/15 2348 08/05/15 0207 08/06/15 0319 08/07/15 0341 08/08/15 0249  NA 137 144 145 145 140  K 4.8 5.3* 4.5 3.3* 2.7*  CL 104 111 118* 115* 108  CO2 20* 20* 18* 23 24  GLUCOSE 107* 103* 104* 102* 109*  BUN 70* 70* 52* 27* 19  CREATININE 7.36* 7.81* 4.73* 2.30* 1.47*  CALCIUM 8.4* 8.3* 8.0* 7.9* 8.0*  PHOS  --   --   --  2.8 2.0*   GFR Estimated Creatinine Clearance: 36.9 mL/min (by C-G formula based on Cr of 1.47). Liver Function Tests:  Recent Labs Lab 08/03/15 1207 08/05/15 0207 08/06/15 0319 08/07/15 0341 08/08/15 0249  AST 22 10* 17  --   --   ALT 15* 9* 10*  --   --   ALKPHOS 112 71 69  --   --   BILITOT 1.1 0.4 0.7  --   --   PROT 7.7 5.4* 5.6*  --   --   ALBUMIN 3.9 2.3* 2.4* 2.2* 2.5*   No results for input(s): LIPASE, AMYLASE in the last 168 hours. No results for input(s): AMMONIA in the last 168 hours. Coagulation profile  Recent Labs Lab 08/04/15 1810  INR 1.25    CBC:  Recent Labs Lab 08/03/15 1207  08/04/15 0634 08/05/15 0207 08/06/15 0319 08/08/15 0249  WBC 23.6* 14.4* 8.8 7.9 10.0  NEUTROABS 20.0*  --   --   --   --   HGB 14.6 12.2* 10.2* 11.0* 12.2*  HCT 43.1 37.2* 31.3* 33.5* 36.3*  MCV 91.3 92.5 91.5 93.6 88.8  PLT 118* 93* 81* 80* 109*   Cardiac Enzymes: No results for input(s): CKTOTAL, CKMB, CKMBINDEX, TROPONINI in the  last 168 hours. BNP (last 3 results) No results for input(s): PROBNP in the last 8760 hours. CBG: No results for input(s): GLUCAP in the last 168 hours. D-Dimer: No results for input(s): DDIMER in the last 72 hours. Hgb A1c: No results for input(s): HGBA1C in the last 72 hours. Lipid Profile: No results for input(s): CHOL, HDL, LDLCALC, TRIG, CHOLHDL, LDLDIRECT in the last 72 hours. Thyroid function studies: No results for input(s): TSH, T4TOTAL, T3FREE, THYROIDAB in the last 72 hours.  Invalid input(s): FREET3 Anemia work up: No results for input(s): VITAMINB12, FOLATE, FERRITIN, TIBC, IRON, RETICCTPCT in the last 72 hours. Sepsis Labs:  Recent Labs Lab 08/04/15 0634 08/05/15 0207 08/06/15 0319 08/08/15 0249  WBC 14.4* 8.8 7.9 10.0   Microbiology Recent Results (from the past 240 hour(s))  MRSA PCR Screening     Status: None   Collection Time: 08/04/15  9:18 PM  Result Value Ref Range Status   MRSA by PCR NEGATIVE NEGATIVE Final    Comment:        The GeneXpert MRSA Assay (FDA approved for NASAL specimens only), is one component of a comprehensive MRSA colonization surveillance program. It is not intended to diagnose MRSA infection nor to guide or monitor treatment for MRSA infections.   Urine culture     Status: None   Collection Time: 08/04/15  9:45 PM  Result Value Ref Range Status   Specimen Description URINE, CLEAN CATCH  Final   Special Requests cipro  Final   Culture MULTIPLE SPECIES PRESENT, SUGGEST RECOLLECTION  Final   Report Status 08/06/2015 FINAL  Final  Culture, blood (routine x 2)     Status: None  (Preliminary result)   Collection Time: 08/04/15 11:30 PM  Result Value Ref Range Status   Specimen Description BLOOD LEFT ARM  Final   Special Requests BOTTLES DRAWN AEROBIC AND ANAEROBIC 10CC  Final   Culture NO GROWTH 2 DAYS  Final   Report Status PENDING  Incomplete  Culture, blood (routine x 2)     Status: None (Preliminary result)   Collection Time: 08/04/15 11:48 PM  Result Value Ref Range Status   Specimen Description BLOOD LEFT HAND  Final   Special Requests IN PEDIATRIC BOTTLE 1CC  Final   Culture NO GROWTH 2 DAYS  Final   Report Status PENDING  Incomplete  Urine culture     Status: None   Collection Time: 08/05/15 11:51 AM  Result Value Ref Range Status   Specimen Description URINE, SUPRAPUBIC  Final   Special Requests Normal  Final   Culture NO GROWTH 1 DAY  Final   Report Status 08/06/2015 FINAL  Final     Medications:   . antiseptic oral rinse  7 mL Mouth Rinse BID  . aspirin  81 mg Oral Daily  . carvedilol  6.25 mg Oral BID WC  . diazepam  5 mg Oral BID  . imipenem-cilastatin  250 mg Intravenous Q8H  . levothyroxine  88 mcg Oral QAC breakfast  . potassium chloride  10 mEq Intravenous Q1 Hr x 4  . verapamil  40 mg Oral Q12H   Continuous Infusions: . sodium chloride 75 mL/hr at 08/07/15 1817    Time spent: 25 min   LOS: 5 days   Charlynne Cousins  [facility] Pager [phone]  *Please refer to [email or web address], password [email or web address] to get updated schedule on who will round on this patient, as hospitalists switch teams weekly. If 7PM-7AM, please contact night-coverage at [email or web address], password [facility]  for any overnight needs.  08/08/2015, 8:16 AM

## 2015-08-08 NOTE — Progress Notes (Signed)
Speech Language Pathology Treatment: Dysphagia  Patient Details Name: Jordan Ward MRN: [id number] DOB: 1940/11/10 Today's Date: 08/08/2015 Time: 4712-5271 SLP Time Calculation (min) (ACUTE ONLY): 14 min  Assessment / Plan / Recommendation Clinical Impression  Breakfast observation with improved oral control and transit. Delayed cough x 1. Reviewed oral and pharyngeal strategies. Lung sounds per RN documentation stable. Continue Dys 3, nectar liquids and thins 30 minutes after meals after oral care as this is his diet/liquid regimen at home. No further ST needed at present.    HPI Other Pertinent Information: 74 y.o. male who presented with UTI possible urosepsis and decreased output in suprapubic catheter with concern for dehydration. PMH: multiple sclerosis, TIA, pulmonary fibrosis, UTI, HTN, OSA, CVA and dysphagia. MBS 08/2014 recommended Dys 3, nectar thick liquids, small sips, chin tuck, throat clear and 2 swallows. MBS 09/2014 recommended Dys 3, nectar thick liquids "during meals and may have thins when not eating food with smal sips, clear throat and second swallow . Zenker's diverticulum confirmed by radiologist. Diet changed to nectar thick liquids 11/6, ST eval ordered. Note from MD today states "pt upset b/c diet was changed."   Pertinent Vitals Pain Assessment: No/denies pain  SLP Plan  All goals met;Discharge SLP treatment due to (comment)    Recommendations Diet recommendations: Dysphagia 3 (mechanical soft);Nectar-thick liquid (thin liquids between meals after oral care) Liquids provided via: Cup Medication Administration: Crushed with puree Supervision: Patient able to self feed Compensations: Small sips/bites;Slow rate;Multiple dry swallows after each bite/sip;Clear throat intermittently Postural Changes and/or Swallow Maneuvers: Seated upright 90 degrees              Oral Care Recommendations: Oral care BID Follow up Recommendations: None Plan: All goals met;Discharge SLP  treatment due to (comment)    GO     Houston Siren 08/08/2015, 8:44 AM   Orbie Pyo Colvin Caroli.[employer] [phone]

## 2015-08-09 DIAGNOSIS — E875 Hyperkalemia: Secondary | ICD-10-CM

## 2015-08-09 LAB — BASIC METABOLIC PANEL
ANION GAP: 8 (ref 5–15)
BUN: 20 mg/dL (ref 6–20)
CALCIUM: 8 mg/dL — AB (ref 8.9–10.3)
CO2: 23 mmol/L (ref 22–32)
CREATININE: 1.47 mg/dL — AB (ref 0.61–1.24)
Chloride: 108 mmol/L (ref 101–111)
GFR calc Af Amer: 53 mL/min — ABNORMAL LOW (ref >60)
GFR, EST NON AFRICAN AMERICAN: 46 mL/min — AB (ref >60)
GLUCOSE: 97 mg/dL (ref 65–99)
Potassium: 4.1 mmol/L (ref 3.5–5.1)
Sodium: 139 mmol/L (ref 135–145)

## 2015-08-09 MED ORDER — LEVOFLOXACIN 500 MG PO TABS
500.0000 mg | ORAL_TABLET | Freq: Every day | ORAL | Status: DC
Start: 2015-08-09 — End: 2015-09-06

## 2015-08-09 MED ORDER — AMOXICILLIN-POT CLAVULANATE 875-125 MG PO TABS
1.0000 | ORAL_TABLET | Freq: Two times a day (BID) | ORAL | Status: DC
  Administered 2015-08-09: 1 via ORAL
  Filled 2015-08-09: qty 1

## 2015-08-09 MED FILL — Fentanyl Citrate Preservative Free (PF) Inj 100 MCG/2ML: INTRAMUSCULAR | Qty: 2 | Status: AC

## 2015-08-09 NOTE — Evaluation (Signed)
Physical Therapy Evaluation Patient Details Name: Jordan Ward MRN: [id number] DOB: 1941/05/22 Today's Date: 08/09/2015   History of Present Illness  74 yo male with L nephrostomy s/p UTI and AKI, history of MS  Clinical Impression  Pt is getting up to walk with supervision to min guard with some care due to his generalized weakness and limited endurance.  Will be home with wife and recommend follow up therapy due to his fall risk.    Follow Up Recommendations Home health PT;Supervision/Assistance - 24 hour    Equipment Recommendations  None recommended by PT    Recommendations for Other Services       Precautions / Restrictions Precautions Precautions: Fall (telemetry) Precaution Comments: telemetry Restrictions Weight Bearing Restrictions: No      Mobility  Bed Mobility Overal bed mobility: Modified Independent                Transfers Overall transfer level: Modified independent                  Ambulation/Gait Ambulation/Gait assistance: Min guard Ambulation Distance (Feet): 140 Feet Assistive device: Rolling walker (2 wheeled) Gait Pattern/deviations: Step-through pattern;Wide base of support;Trunk flexed Gait velocity: reduced Gait velocity interpretation: Below normal speed for age/gender General Gait Details: slowed his pace and has controlled turns  Science writer    Modified Rankin (Stroke Patients Only)       Balance Overall balance assessment: Modified Independent                                           Pertinent Vitals/Pain Pain Assessment: No/denies pain    Home Living Family/patient expects to be discharged to:: Private residence Living Arrangements: Spouse/significant other Available Help at Discharge: Family;Available 24 hours/day Type of Home: House Home Access: Stairs to enter Entrance Stairs-Rails: Right Entrance Stairs-Number of Steps: 2 Home Layout: One level Home  Equipment: Walker - 2 wheels      Prior Function Level of Independence: Independent with assistive device(s)               Hand Dominance        Extremity/Trunk Assessment   Upper Extremity Assessment: Overall WFL for tasks assessed           Lower Extremity Assessment: Generalized weakness      Cervical / Trunk Assessment: Kyphotic  Communication   Communication: No difficulties  Cognition Arousal/Alertness: Awake/alert Behavior During Therapy: WFL for tasks assessed/performed Overall Cognitive Status: Within Functional Limits for tasks assessed                      General Comments General comments (skin integrity, edema, etc.): has fairly well controlled static standing but could benefit from PT to increase dynamic control    Exercises        Assessment/Plan    PT Assessment Patient needs continued PT services  PT Diagnosis Generalized weakness;Other (comment) (related to UTI and MS)   PT Problem List Decreased strength;Decreased activity tolerance;Decreased range of motion;Decreased balance;Decreased mobility;Decreased coordination;Decreased safety awareness;Cardiopulmonary status limiting activity  PT Treatment Interventions DME instruction;Gait training;Stair training;Functional mobility training;Therapeutic activities;Therapeutic exercise;Balance training;Neuromuscular re-education;Patient/family education   PT Goals (Current goals can be found in the Care Plan section) Acute Rehab PT Goals Patient Stated Goal: to be able to get home  soon PT Goal Formulation: With patient Time For Goal Achievement: 08/23/15 Potential to Achieve Goals: Good    Frequency Min 2X/week   Barriers to discharge Decreased caregiver support (wife cannot lift him)      Co-evaluation               End of Session Equipment Utilized During Treatment: Gait belt Activity Tolerance: Patient tolerated treatment well Patient left: in bed;with call bell/phone  within reach Nurse Communication: Mobility status         Time: [id number] PT Time Calculation (min) (ACUTE ONLY): 24 min   Charges:   PT Evaluation $Initial PT Evaluation Tier I: 1 Procedure PT Treatments $Gait Training: 8-22 mins   PT G CodesRamond Dial 08-21-2015, 12:28 PM   Mee Hives, PT [place] Acute Rehab Dept. Number: [phone] [phone] and [place] [phone]

## 2015-08-09 NOTE — Care Management Important Message (Signed)
Important Message  Patient Details  Name: Jordan Ward MRN: [id number] Date of Birth: [birth year]   Medicare Important Message Given:  Yes    Barb Merino [place] 08/09/2015, 3:17 PM

## 2015-08-09 NOTE — Progress Notes (Signed)
Discharge paperwork gone over with patient and family. All questions answered to patient satisfaction. Telemetry discontinued and iv removed intact. Patient discharged to home with home health by wheelchair.

## 2015-08-09 NOTE — Discharge Summary (Signed)
Physician Discharge Summary  HALL BIRCHARD KZS:[id number] DOB: [birth year] DOA: 08/03/2015  PCP: [name], MD  Admit date: 08/03/2015 Discharge date: 08/09/2015  Time spent: 35 minutes  Recommendations for Outpatient Follow-up:  1. Will follow-up with urology in 1 weeks. 2. Continue take antibiotics for 2 additional days.  Discharge Diagnoses:  Principal Problem:   AKI (acute kidney injury) ([place]) Active Problems:   Hyperkalemia   HTN (hypertension), malignant   Hypothyroidism   Dilated cardiomyopathy (H[facility])   Chronic systolic congestive heart failure (H[facility])   Hydronephrosis with obstructing calculus   Essential hypertension   Junctional rhythm   Multiple sclerosis ([place])   UTI (lower urinary tract infection)   Pulmonary fibrosis ([place])   Discharge Condition: guarded  Diet recommendation: regular  Filed Weights   08/07/15 0506 08/08/15 0610 08/09/15 0604  Weight: 55.2 kg (121 lb 11.1 oz) 57.398 kg (126 lb 8.6 oz) 55.203 kg (121 lb 11.2 oz)    History of present illness:  74 year old was seen by his primary care doctor for a presumed UTI had a fever 101 and abdominal pain, he tried outpatient treatment which she feels he came into the ED septic so he was transferred to [facility] Course:  Acute kidney injury due to obstructive uropathy with hydronephrosis in the setting of obstructing calculus: CT of the abdomen was done on 08/04/2015 showed a 10 mm left calculus urology was consulted who recommended a percutaneous tube placement which was done by IR and 08/05/2015. Nephrology was consulted who recommended IV fluids on admission his creatinine was 7 came down to 1.4 which is at baseline. Dialysis catheter was placed for possible need of dialysis, but as his renal function improved to baseline dialysis catheter was DC'd.  Possible UTI: He was started empirically on IV antibiotics urine cultures remain negative has he was previously on Levaquin. He has  completed 5 days of IV antibiotics in-house will change him back to Levaquin for 2 additional days. His blood cultures and urine cultures remain negative.  Hyperkalemia/hypokalemia: Due to renal dysfunction was repleted and normalized.  Multiple sclerosis: Remained stable.  Essential hypertension/malignant hypertension: He was continue on verapamil and Coreg.  Hypothyroidism: Continue Synthroid.  Dilated cardiomyopathy/chronic systolic heart failure: 2-D echo was performed that is unchanged no changes were made to his medication. ACE inhibitor due to his renal function, we'll continue his Coreg. The reevaluated as an outpatient to restart ACE inhibitor.   Procedures:  Nephrostomy tube placement  Consultations:  Urology  Nephrology  Discharge Exam: Filed Vitals:   08/09/15 0818  BP: 135/79  Pulse:   Temp:   Resp:     General: A&O x3 Cardiovascular: RRR Respiratory: good air movement CTA B/L  Discharge Instructions   Discharge Instructions    Diet - low sodium heart healthy    Complete by:  As directed      Increase activity slowly    Complete by:  As directed           Current Discharge Medication List    CONTINUE these medications which have CHANGED   Details  levofloxacin (LEVAQUIN) 500 MG tablet Take 1 tablet (500 mg total) by mouth daily. For 14 days(started 08/02/15) Otho Darner: 14 tablet, Refills: 0      CONTINUE these medications which have NOT CHANGED   Details  aspirin 81 MG chewable tablet Chew 1 tablet (81 mg total) by mouth daily.    carvedilol (COREG) 6.25 MG tablet Take 1 tablet (6.25 mg total)  by mouth 2 (two) times daily with a meal.    diazepam (VALIUM) 5 MG tablet Take 1 tablet (5 mg total) by mouth 2 (two) times daily. Qty: 60 tablet, Refills: 5    HYDROcodone-acetaminophen (NORCO) 7.5-325 MG per tablet Take 1 tablet by mouth 2 (two) times daily. Max APAP 3 GM IN 24 HOURS FROM ALL SOURCES Qty: 60 tablet, Refills: 0    levothyroxine  (SYNTHROID, LEVOTHROID) 88 MCG tablet Take 88 mcg by mouth daily before breakfast.    potassium chloride SA (K-DUR,KLOR-CON) 20 MEQ tablet Take 20 mEq by mouth daily.    verapamil (CALAN) 40 MG tablet Take 40 mg by mouth 2 (two) times daily.       Allergies  Allergen Reactions  . Tetracyclines & Related Anaphylaxis and Rash  . Ciprofloxacin     Trouble swallowing   Follow-up Information    Follow up with HAWKINS,EDWARD L, MD In 2 weeks.   Specialty:  Pulmonary Disease   Why:  hospital follow up   Contact information:   [address] [place] [address] [phone]        The results of significant diagnostics from this hospitalization (including imaging, microbiology, ancillary and laboratory) are listed below for reference.    Significant Diagnostic Studies: Ct Abdomen Pelvis Wo Contrast  08/04/2015  CLINICAL DATA:  74 year old male with history of acute tubular necrosis and no urine output for the past 3 days. History of suprapubic catheter in place for the past 3 years. EXAM: CT ABDOMEN AND PELVIS WITHOUT CONTRAST TECHNIQUE: Multidetector CT imaging of the abdomen and pelvis was performed following the standard protocol without IV contrast. COMPARISON:  CT the abdomen and pelvis 07/18/2014. FINDINGS: Lower chest: Peripheral ground-glass attenuation and subpleural reticulation in the visualize lung bases, concerning for underlying interstitial lung disease. Small left pleural effusion lying dependently. Calcifications of the aortic valve. Atherosclerotic calcifications in the right coronary artery. Atherosclerotic calcifications in the left circumflex and right coronary arteries. Hepatobiliary: No definite cystic or solid hepatic lesions are identified on today's noncontrast CT examination. Status post cholecystectomy. 16 x 10 x 7 mm partially calcified stone in the distal common bile duct (image 32 of series 2). Although poorly evaluated on today's noncontrast CT  examination, there does appear to be ductal dilatation, as the common bile duct measures at least 12 mm in the porta hepatis. Pancreas: No definite pancreatic mass or peripancreatic inflammatory changes on today's noncontrast CT examination. Spleen: Unremarkable. Adrenals/Urinary Tract: 10 mm calculus at the left ureteropelvic junction associated with moderate to severe proximal hydronephrosis and perinephric stranding. Multiple additional nonobstructive calculi are present within the collecting systems of the kidneys bilaterally, the largest of which is in the lower pole of the left kidney measuring 9 mm. No additional ureteral stones are noted. 9 mm stone in the inferior aspect of the urinary bladder. Right kidney is atrophic. Multiple low-attenuation lesions in the left kidney again noted, incompletely characterized on today's noncontrast CT examination, but similar to the prior study, likely cysts, largest of which measures 2.6 cm extending exophytic the off the lateral aspect of the upper pole of the left kidney. Urinary bladder is nearly completely decompressed with an indwelling suprapubic catheter in position. Bilateral adrenal glands are normal in appearance. Stomach/Bowel: The unenhanced appearance of the stomach is normal. Suture line in the proximal sigmoid colon, and absence of much of the left hemicolon related to prior partial colectomy. Status post appendectomy. Vascular/Lymphatic: Extensive atherosclerosis throughout the abdominal  and pelvic vasculature, without evidence of aneurysm. Notably, the distal abdominal aorta is very diminutive in size, as are the proximal common iliac arteries, which could indicate chronic occlusion. Multiple borderline enlarged upper abdominal lymph nodes, largest of which is a high left para-aortic lymph node adjacent to the left renal hilum measuring 11 mm in short axis. This is likely reactive. Reproductive: Prostate gland and seminal vesicles are unremarkable in  appearance. Other: Ventral hernia in the lower abdomen containing multiple loops of small bowel, without evidence of incarceration or obstruction at this time. Trace volume of ascites. No pneumoperitoneum. Musculoskeletal: There are no aggressive appearing lytic or blastic lesions noted in the visualized portions of the skeleton. IMPRESSION: 1. Obstructive 10 mm calculus at the left ureteropelvic junction with with moderate to severe proximal hydronephrosis and perinephric stranding. 2. Multiple additional nonobstructive calculi are present within the collecting systems of the kidneys bilaterally, and in the lumen of the urinary bladder. 3. Large partially calcified stone in the distal common bile duct associated with proximal hepatic ductal dilatation. This appears to be chronic, and is similar to remote prior study 07/18/2014. 4. Trace volume of ascites. 5. Right renal atrophy. 6. Small left pleural effusion lying dependently. 7. Findings in the lung bases suggestive of potential interstitial lung disease. This could be further evaluated with nonemergent high-resolution chest CT and referral to Pulmonology in the near future if clinically appropriate. 8. Extensive atherosclerosis, including at least 2 vessel coronary artery disease. Additionally, the distal infrarenal abdominal aorta and proximal common iliac arteries are very diminutive in size, which could indicate chronic occlusion or high-grade stenosis. 9. Large ventral hernia in the anterior abdominal [place] containing multiple loops of small bowel, without evidence of incarceration or obstruction at this time. 10. Additional incidental findings, as above. Electronically Signed   By: Vinnie Langton M.D.   On: 08/04/2015 16:20   X-ray Chest Pa And Lateral  08/03/2015  CLINICAL DATA:  74 year old male with fever EXAM: CHEST  2 VIEW COMPARISON:  Prior chest x-ray 07/18/2014 FINDINGS: Cardiac and mediastinal contours are within normal limits no focal airspace  consolidation, pulmonary edema, pleural effusion or pneumothorax. Atherosclerotic calcifications are present in the transverse aorta. Mild central bronchitic change remains. No acute osseous abnormality. IMPRESSION: No active cardiopulmonary process. Electronically Signed   By: Jacqulynn Cadet M.D.   On: 08/03/2015 14:07   US Renal  08/04/2015  CLINICAL DATA:  Acute tubular necrosis. EXAM: RENAL / URINARY TRACT ULTRASOUND COMPLETE COMPARISON:  08/07/2014. FINDINGS: Right Kidney: Length: 8.3 cm. Increased echogenicity with progression. Diffuse parenchymal thinning and prominent renal sinus fat, also with progression. 6 mm calculus in the mid kidney. No hydronephrosis. Left Kidney: Length: 13.2 cm. Interval diffusely increased echogenicity. Interval mild to moderate dilatation of the collecting system. 10 mm calculus in the kidney. Bladder: Not distended, with a suprapubic catheter in place. IMPRESSION: 1. Interval mild to moderate left hydronephrosis, most likely due to a nonvisualized ureteral calculus. 2. Bilateral nonobstructing renal calculi. 3. Interval increased echogenicity of both kidneys, compatible with medical renal disease. 4. Progressive right renal atrophy. Electronically Signed   By: Claudie Revering M.D.   On: 08/04/2015 11:14   Ir Fluoro Guide Cv Line Right  08/05/2015  INDICATION: History of chronic suprapubic catheter placement, now with obstructive uropathy and concern for impending urosepsis secondary to an approximately 1 cm stone within the superior aspect of left ureter with associated moderate severe left-sided pelvicaliectasis and worsening renal insufficiency. Request made for placement of a  left-sided percutaneous nephrostomy catheter as well as a temporary dialysis catheter. EXAM: NON-TUNNELED CENTRAL VENOUS HEMODIALYSIS CATHETER PLACEMENT WITH ULTRASOUND AND FLUOROSCOPIC GUIDANCE COMPARISON:  None. MEDICATIONS: Versed 1 mg IV; Fentanyl 50 mcg IV; Ancef 2 g IV; the antibiotic was  administered with an appropriate time frame prior to the initiation of the procedure. CONTRAST:  None FLUOROSCOPY TIME:  12 seconds (3.8 mGy) COMPLICATIONS: None immediate PROCEDURE: Informed written consent was obtained from the patient after a discussion of the risks, benefits, and alternatives to treatment. Questions regarding the procedure were encouraged and answered. The right neck and chest were prepped with chlorhexidine in a sterile fashion, and a sterile drape was applied covering the operative field. Maximum barrier sterile technique with sterile gowns and gloves were used for the procedure. A timeout was performed prior to the initiation of the procedure. After creating a small venotomy incision, a micropuncture kit was utilized to access the right internal jugular vein under direct, real-time ultrasound guidance after the overlying soft tissues were anesthetized with 1% lidocaine with epinephrine. Ultrasound image documentation was performed. The microwire was kinked to measure appropriate catheter length. A stiff glidewire was advanced to the level of the IVC. Under fluoroscopic guidance, the venotomy was serially dilated, ultimately allowing placement of a 20 cm temporary Trialysis catheter with tip ultimately terminating within the superior aspect of the right atrium. Final catheter positioning was confirmed and documented with a spot radiographic image. The catheter aspirates and flushes normally. The catheter was flushed with appropriate volume heparin dwells. The catheter exit site was secured with a 0-Prolene retention suture. A dressing was placed. The patient tolerated the procedure well without immediate post procedural complication. IMPRESSION: Successful placement of a right internal jugular approach 20 cm temporary dialysis catheter with tip terminating with in the superior aspect of the right atrium. The catheter is ready for immediate use. PLAN: This catheter may be converted to a  tunneled dialysis catheter at a later date as indicated. Electronically Signed   By: Sandi Mariscal M.D.   On: 08/05/2015 11:51   Ir US Guide Vasc Access Right  08/05/2015  INDICATION: History of chronic suprapubic catheter placement, now with obstructive uropathy and concern for impending urosepsis secondary to an approximately 1 cm stone within the superior aspect of left ureter with associated moderate severe left-sided pelvicaliectasis and worsening renal insufficiency. Request made for placement of a left-sided percutaneous nephrostomy catheter as well as a temporary dialysis catheter. EXAM: NON-TUNNELED CENTRAL VENOUS HEMODIALYSIS CATHETER PLACEMENT WITH ULTRASOUND AND FLUOROSCOPIC GUIDANCE COMPARISON:  None. MEDICATIONS: Versed 1 mg IV; Fentanyl 50 mcg IV; Ancef 2 g IV; the antibiotic was administered with an appropriate time frame prior to the initiation of the procedure. CONTRAST:  None FLUOROSCOPY TIME:  12 seconds (3.8 mGy) COMPLICATIONS: None immediate PROCEDURE: Informed written consent was obtained from the patient after a discussion of the risks, benefits, and alternatives to treatment. Questions regarding the procedure were encouraged and answered. The right neck and chest were prepped with chlorhexidine in a sterile fashion, and a sterile drape was applied covering the operative field. Maximum barrier sterile technique with sterile gowns and gloves were used for the procedure. A timeout was performed prior to the initiation of the procedure. After creating a small venotomy incision, a micropuncture kit was utilized to access the right internal jugular vein under direct, real-time ultrasound guidance after the overlying soft tissues were anesthetized with 1% lidocaine with epinephrine. Ultrasound image documentation was performed. The microwire was kinked to measure  appropriate catheter length. A stiff glidewire was advanced to the level of the IVC. Under fluoroscopic guidance, the venotomy was  serially dilated, ultimately allowing placement of a 20 cm temporary Trialysis catheter with tip ultimately terminating within the superior aspect of the right atrium. Final catheter positioning was confirmed and documented with a spot radiographic image. The catheter aspirates and flushes normally. The catheter was flushed with appropriate volume heparin dwells. The catheter exit site was secured with a 0-Prolene retention suture. A dressing was placed. The patient tolerated the procedure well without immediate post procedural complication. IMPRESSION: Successful placement of a right internal jugular approach 20 cm temporary dialysis catheter with tip terminating with in the superior aspect of the right atrium. The catheter is ready for immediate use. PLAN: This catheter may be converted to a tunneled dialysis catheter at a later date as indicated. Electronically Signed   By: Sandi Mariscal M.D.   On: 08/05/2015 11:51   Ir Nephrostomy Placement Left  08/05/2015  INDICATION: History of chronic suprapubic catheter placement, now with obstructive uropathy and concern for impending urosepsis secondary to an approximately 1 cm stone within the superior aspect of left ureter with associated moderate severe left-sided pelvicaliectasis and worsening renal insufficiency. Request made for placement of a left-sided percutaneous nephrostomy catheter as well as a temporary dialysis catheter. EXAM: 1. ULTRASOUND GUIDANCE FOR PUNCTURE OF THE LEFT RENAL COLLECTING SYSTEM 2. LEFT PERCUTANEOUS NEPHROSTOMY TUBE PLACEMENT. COMPARISON:  CT abdomen pelvis - 08/04/2015 MEDICATIONS: Ancef 2 g IV; The antibiotic was administered in an appropriate time frame prior to skin puncture. ANESTHESIA/SEDATION: Fentanyl 100 mcg IV; Versed 2 mg IV Total Moderate Sedation Time 10 minutes. CONTRAST:  10 mL Isovue 300 administered into the collecting system FLUOROSCOPY TIME:  1 minutes 6 seconds (16.1 mGy) COMPLICATIONS: None immediate PROCEDURE: The  procedure, risks, benefits, and alternatives were explained to the patient. Questions regarding the procedure were encouraged and answered. The patient understands and consents to the procedure. A timeout was performed prior to the initiation of the procedure. The left flank region was prepped with Betadine in a sterile fashion, and a sterile drape was applied covering the operative field. A sterile gown and sterile gloves were used for the procedure. A preprocedural spot radiographs obtained of the right mid hemi abdomen. Local anesthesia was provided with 1% Lidocaine with epinephrine. Ultrasound was used to localize the left kidney. Under direct ultrasound guidance, a 21 gauge needle was advanced into the renal collecting system. An ultrasound image documentation was performed. Access within the collecting system was confirmed with the efflux of urine followed by contrast injection. Over a Nitrex wire, the inner three [place] catheter of an Accustick set was advanced into the renal collecting system. Contrast injection was injected into the collecting system as several spot radiographs were obtained in various obliquities confirming puncture within a posterior inferior calix. As such, the tract was dilated with an Accustick stent. Over a guide wire, a 10-French percutaneous nephrostomy catheter was advanced into the collecting system where the coil was formed and locked. Contrast was injected and several sport radiographs were obtained in various obliquities confirming access. A small sample was aspirated from the nephrostomy, capped and sent to the laboratory for analysis. The catheter was secured at the skin with a Prolene retention suture and a gravity bag was placed. A dressing was placed. The patient tolerated procedure well without immediate postprocedural complication. FINDINGS: Preprocedural spot radiographic image demonstrates a punctate opacity overlying the expected location the superior aspect  of the  left ureter which correlates with the obstructing ureteral stone seen on preceding abdominal CT. Ultrasound scanning demonstrates a moderate to severely dilated left collecting system. Under direct ultrasound guidance, a posterior inferior calix was targeted allowing advancement of an 10-French percutaneous nephrostomy catheter under intermittent fluoroscopic guidance. Contrast injection confirmed appropriate positioning. A small sample of aspirated urine was capped and sent to the laboratory for analysis. IMPRESSION: Successful ultrasound and fluoroscopic guided placement of a left sided 10 French PCN. A small sample of aspirated urine was capped and sent to the laboratory for analysis. PLAN: This percutaneous access could be utilized for subsequent percutaneous nephrolithotomy as clinically indicated. Electronically Signed   By: Sandi Mariscal M.D.   On: 08/05/2015 11:49    Microbiology: Recent Results (from the past 240 hour(s))  MRSA PCR Screening     Status: None   Collection Time: 08/04/15  9:18 PM  Result Value Ref Range Status   MRSA by PCR NEGATIVE NEGATIVE Final    Comment:        The GeneXpert MRSA Assay (FDA approved for NASAL specimens only), is one component of a comprehensive MRSA colonization surveillance program. It is not intended to diagnose MRSA infection nor to guide or monitor treatment for MRSA infections.   Urine culture     Status: None   Collection Time: 08/04/15  9:45 PM  Result Value Ref Range Status   Specimen Description URINE, CLEAN CATCH  Final   Special Requests cipro  Final   Culture MULTIPLE SPECIES PRESENT, SUGGEST RECOLLECTION  Final   Report Status 08/06/2015 FINAL  Final  Culture, blood (routine x 2)     Status: None (Preliminary result)   Collection Time: 08/04/15 11:30 PM  Result Value Ref Range Status   Specimen Description BLOOD LEFT ARM  Final   Special Requests BOTTLES DRAWN AEROBIC AND ANAEROBIC 10CC  Final   Culture NO GROWTH 3 DAYS  Final    Report Status PENDING  Incomplete  Culture, blood (routine x 2)     Status: None (Preliminary result)   Collection Time: 08/04/15 11:48 PM  Result Value Ref Range Status   Specimen Description BLOOD LEFT HAND  Final   Special Requests IN PEDIATRIC BOTTLE 1CC  Final   Culture NO GROWTH 3 DAYS  Final   Report Status PENDING  Incomplete  Urine culture     Status: None   Collection Time: 08/05/15 11:51 AM  Result Value Ref Range Status   Specimen Description URINE, SUPRAPUBIC  Final   Special Requests Normal  Final   Culture NO GROWTH 1 DAY  Final   Report Status 08/06/2015 FINAL  Final     Labs: Basic Metabolic Panel:  Recent Labs Lab 08/05/15 0207 08/06/15 0319 08/07/15 0341 08/08/15 0249 08/09/15 0500  NA 144 145 145 140 139  K 5.3* 4.5 3.3* 2.7* 4.1  CL 111 118* 115* 108 108  CO2 20* 18* '23 24 23  ' GLUCOSE 103* 104* 102* 109* 97  BUN 70* 52* 27* 19 20  CREATININE 7.81* 4.73* 2.30* 1.47* 1.47*  CALCIUM 8.3* 8.0* 7.9* 8.0* 8.0*  MG  --   --   --  1.3*  --   PHOS  --   --  2.8 2.0*  --    Liver Function Tests:  Recent Labs Lab 08/03/15 1207 08/05/15 0207 08/06/15 0319 08/07/15 0341 08/08/15 0249  AST 22 10* 17  --   --   ALT 15* 9* 10*  --   --  ALKPHOS 112 71 69  --   --   BILITOT 1.1 0.4 0.7  --   --   PROT 7.7 5.4* 5.6*  --   --   ALBUMIN 3.9 2.3* 2.4* 2.2* 2.5*   No results for input(s): LIPASE, AMYLASE in the last 168 hours. No results for input(s): AMMONIA in the last 168 hours. CBC:  Recent Labs Lab 08/03/15 1207 08/04/15 0634 08/05/15 0207 08/06/15 0319 08/08/15 0249  WBC 23.6* 14.4* 8.8 7.9 10.0  NEUTROABS 20.0*  --   --   --   --   HGB 14.6 12.2* 10.2* 11.0* 12.2*  HCT 43.1 37.2* 31.3* 33.5* 36.3*  MCV 91.3 92.5 91.5 93.6 88.8  PLT 118* 93* 81* 80* 109*   Cardiac Enzymes: No results for input(s): CKTOTAL, CKMB, CKMBINDEX, TROPONINI in the last 168 hours. BNP: BNP (last 3 results)  Recent Labs  08/05/15 0207  BNP 1190.1*     ProBNP (last 3 results) No results for input(s): PROBNP in the last 8760 hours.  CBG: No results for input(s): GLUCAP in the last 168 hours.     Signed:  Charlynne Cousins  [facility] 08/09/2015, 9:54 AM

## 2015-08-09 NOTE — Care Management Note (Signed)
Case Management Note  Patient Details  Name: Jordan Ward MRN: [id number] Date of Birth: [birth year]  Subjective/Objective:         Admitted with AKI           Action/Plan: Patient lives at home with spouse, has a walker. HHC choice offered, patient chose [facility]. Butch Penny with [facility] called for arrangement. Expected Discharge Date:  08/09/2015               Expected Discharge Plan:  [place]  Discharge planning Services  CM Consult  Choice offered to:  Patient  HH Arranged:  PT [facility] Agency:  [place]  Status of Service:  In process, will continue to follow  Medicare Important Message Given:  [facility] notification given  Sherrilyn Rist [phone] 08/09/2015, 2:37 PM

## 2015-08-10 DIAGNOSIS — Z8673 Personal history of transient ischemic attack (TIA), and cerebral infarction without residual deficits: Secondary | ICD-10-CM | POA: Diagnosis not present

## 2015-08-10 DIAGNOSIS — I5022 Chronic systolic (congestive) heart failure: Secondary | ICD-10-CM | POA: Diagnosis not present

## 2015-08-10 DIAGNOSIS — J8410 Pulmonary fibrosis, unspecified: Secondary | ICD-10-CM | POA: Diagnosis not present

## 2015-08-10 DIAGNOSIS — N136 Pyonephrosis: Secondary | ICD-10-CM | POA: Diagnosis not present

## 2015-08-10 DIAGNOSIS — Z9350 Unspecified cystostomy status: Secondary | ICD-10-CM | POA: Diagnosis not present

## 2015-08-10 DIAGNOSIS — Z992 Dependence on renal dialysis: Secondary | ICD-10-CM | POA: Diagnosis not present

## 2015-08-10 DIAGNOSIS — Z436 Encounter for attention to other artificial openings of urinary tract: Secondary | ICD-10-CM | POA: Diagnosis not present

## 2015-08-10 DIAGNOSIS — Z9181 History of falling: Secondary | ICD-10-CM | POA: Diagnosis not present

## 2015-08-10 DIAGNOSIS — Z87891 Personal history of nicotine dependence: Secondary | ICD-10-CM | POA: Diagnosis not present

## 2015-08-10 DIAGNOSIS — N186 End stage renal disease: Secondary | ICD-10-CM | POA: Diagnosis not present

## 2015-08-10 DIAGNOSIS — G35 Multiple sclerosis: Secondary | ICD-10-CM | POA: Diagnosis not present

## 2015-08-10 DIAGNOSIS — Z7982 Long term (current) use of aspirin: Secondary | ICD-10-CM | POA: Diagnosis not present

## 2015-08-10 DIAGNOSIS — I420 Dilated cardiomyopathy: Secondary | ICD-10-CM | POA: Diagnosis not present

## 2015-08-10 DIAGNOSIS — G709 Myoneural disorder, unspecified: Secondary | ICD-10-CM | POA: Diagnosis not present

## 2015-08-10 DIAGNOSIS — G4733 Obstructive sleep apnea (adult) (pediatric): Secondary | ICD-10-CM | POA: Diagnosis not present

## 2015-08-10 DIAGNOSIS — Z48812 Encounter for surgical aftercare following surgery on the circulatory system: Secondary | ICD-10-CM | POA: Diagnosis not present

## 2015-08-10 DIAGNOSIS — I2510 Atherosclerotic heart disease of native coronary artery without angina pectoris: Secondary | ICD-10-CM | POA: Diagnosis not present

## 2015-08-10 DIAGNOSIS — N390 Urinary tract infection, site not specified: Secondary | ICD-10-CM | POA: Diagnosis not present

## 2015-08-10 DIAGNOSIS — M1991 Primary osteoarthritis, unspecified site: Secondary | ICD-10-CM | POA: Diagnosis not present

## 2015-08-10 DIAGNOSIS — D649 Anemia, unspecified: Secondary | ICD-10-CM | POA: Diagnosis not present

## 2015-08-10 DIAGNOSIS — K439 Ventral hernia without obstruction or gangrene: Secondary | ICD-10-CM | POA: Diagnosis not present

## 2015-08-10 DIAGNOSIS — I132 Hypertensive heart and chronic kidney disease with heart failure and with stage 5 chronic kidney disease, or end stage renal disease: Secondary | ICD-10-CM | POA: Diagnosis not present

## 2015-08-10 DIAGNOSIS — I739 Peripheral vascular disease, unspecified: Secondary | ICD-10-CM | POA: Diagnosis not present

## 2015-08-10 DIAGNOSIS — I480 Paroxysmal atrial fibrillation: Secondary | ICD-10-CM | POA: Diagnosis not present

## 2015-08-10 LAB — CULTURE, BLOOD (ROUTINE X 2)
Culture: NO GROWTH
Culture: NO GROWTH

## 2015-08-13 DIAGNOSIS — I132 Hypertensive heart and chronic kidney disease with heart failure and with stage 5 chronic kidney disease, or end stage renal disease: Secondary | ICD-10-CM | POA: Diagnosis not present

## 2015-08-13 DIAGNOSIS — G35 Multiple sclerosis: Secondary | ICD-10-CM | POA: Diagnosis not present

## 2015-08-13 DIAGNOSIS — Z48812 Encounter for surgical aftercare following surgery on the circulatory system: Secondary | ICD-10-CM | POA: Diagnosis not present

## 2015-08-13 DIAGNOSIS — N136 Pyonephrosis: Secondary | ICD-10-CM | POA: Diagnosis not present

## 2015-08-13 DIAGNOSIS — N390 Urinary tract infection, site not specified: Secondary | ICD-10-CM | POA: Diagnosis not present

## 2015-08-13 DIAGNOSIS — Z436 Encounter for attention to other artificial openings of urinary tract: Secondary | ICD-10-CM | POA: Diagnosis not present

## 2015-08-14 ENCOUNTER — Ambulatory Visit (INDEPENDENT_AMBULATORY_CARE_PROVIDER_SITE_OTHER): Payer: Medicare Other | Admitting: Urology

## 2015-08-14 ENCOUNTER — Other Ambulatory Visit: Payer: Self-pay | Admitting: Urology

## 2015-08-14 DIAGNOSIS — N319 Neuromuscular dysfunction of bladder, unspecified: Secondary | ICD-10-CM | POA: Diagnosis not present

## 2015-08-14 DIAGNOSIS — N312 Flaccid neuropathic bladder, not elsewhere classified: Secondary | ICD-10-CM

## 2015-08-14 DIAGNOSIS — N200 Calculus of kidney: Secondary | ICD-10-CM

## 2015-08-15 DIAGNOSIS — Z48812 Encounter for surgical aftercare following surgery on the circulatory system: Secondary | ICD-10-CM | POA: Diagnosis not present

## 2015-08-15 DIAGNOSIS — I132 Hypertensive heart and chronic kidney disease with heart failure and with stage 5 chronic kidney disease, or end stage renal disease: Secondary | ICD-10-CM | POA: Diagnosis not present

## 2015-08-15 DIAGNOSIS — G35 Multiple sclerosis: Secondary | ICD-10-CM | POA: Diagnosis not present

## 2015-08-15 DIAGNOSIS — Z436 Encounter for attention to other artificial openings of urinary tract: Secondary | ICD-10-CM | POA: Diagnosis not present

## 2015-08-15 DIAGNOSIS — N136 Pyonephrosis: Secondary | ICD-10-CM | POA: Diagnosis not present

## 2015-08-15 DIAGNOSIS — N390 Urinary tract infection, site not specified: Secondary | ICD-10-CM | POA: Diagnosis not present

## 2015-08-17 DIAGNOSIS — N390 Urinary tract infection, site not specified: Secondary | ICD-10-CM | POA: Diagnosis not present

## 2015-08-17 DIAGNOSIS — I132 Hypertensive heart and chronic kidney disease with heart failure and with stage 5 chronic kidney disease, or end stage renal disease: Secondary | ICD-10-CM | POA: Diagnosis not present

## 2015-08-17 DIAGNOSIS — N136 Pyonephrosis: Secondary | ICD-10-CM | POA: Diagnosis not present

## 2015-08-17 DIAGNOSIS — Z436 Encounter for attention to other artificial openings of urinary tract: Secondary | ICD-10-CM | POA: Diagnosis not present

## 2015-08-17 DIAGNOSIS — Z48812 Encounter for surgical aftercare following surgery on the circulatory system: Secondary | ICD-10-CM | POA: Diagnosis not present

## 2015-08-17 DIAGNOSIS — G35 Multiple sclerosis: Secondary | ICD-10-CM | POA: Diagnosis not present

## 2015-08-20 DIAGNOSIS — I132 Hypertensive heart and chronic kidney disease with heart failure and with stage 5 chronic kidney disease, or end stage renal disease: Secondary | ICD-10-CM | POA: Diagnosis not present

## 2015-08-20 DIAGNOSIS — Z436 Encounter for attention to other artificial openings of urinary tract: Secondary | ICD-10-CM | POA: Diagnosis not present

## 2015-08-20 DIAGNOSIS — Z48812 Encounter for surgical aftercare following surgery on the circulatory system: Secondary | ICD-10-CM | POA: Diagnosis not present

## 2015-08-20 DIAGNOSIS — N390 Urinary tract infection, site not specified: Secondary | ICD-10-CM | POA: Diagnosis not present

## 2015-08-20 DIAGNOSIS — G35 Multiple sclerosis: Secondary | ICD-10-CM | POA: Diagnosis not present

## 2015-08-20 DIAGNOSIS — N136 Pyonephrosis: Secondary | ICD-10-CM | POA: Diagnosis not present

## 2015-08-21 DIAGNOSIS — Z48812 Encounter for surgical aftercare following surgery on the circulatory system: Secondary | ICD-10-CM | POA: Diagnosis not present

## 2015-08-21 DIAGNOSIS — I132 Hypertensive heart and chronic kidney disease with heart failure and with stage 5 chronic kidney disease, or end stage renal disease: Secondary | ICD-10-CM | POA: Diagnosis not present

## 2015-08-21 DIAGNOSIS — N136 Pyonephrosis: Secondary | ICD-10-CM | POA: Diagnosis not present

## 2015-08-21 DIAGNOSIS — Z436 Encounter for attention to other artificial openings of urinary tract: Secondary | ICD-10-CM | POA: Diagnosis not present

## 2015-08-21 DIAGNOSIS — N390 Urinary tract infection, site not specified: Secondary | ICD-10-CM | POA: Diagnosis not present

## 2015-08-21 DIAGNOSIS — G35 Multiple sclerosis: Secondary | ICD-10-CM | POA: Diagnosis not present

## 2015-08-22 DIAGNOSIS — G35 Multiple sclerosis: Secondary | ICD-10-CM | POA: Diagnosis not present

## 2015-08-22 DIAGNOSIS — Z48812 Encounter for surgical aftercare following surgery on the circulatory system: Secondary | ICD-10-CM | POA: Diagnosis not present

## 2015-08-22 DIAGNOSIS — I132 Hypertensive heart and chronic kidney disease with heart failure and with stage 5 chronic kidney disease, or end stage renal disease: Secondary | ICD-10-CM | POA: Diagnosis not present

## 2015-08-22 DIAGNOSIS — N390 Urinary tract infection, site not specified: Secondary | ICD-10-CM | POA: Diagnosis not present

## 2015-08-22 DIAGNOSIS — N136 Pyonephrosis: Secondary | ICD-10-CM | POA: Diagnosis not present

## 2015-08-22 DIAGNOSIS — Z436 Encounter for attention to other artificial openings of urinary tract: Secondary | ICD-10-CM | POA: Diagnosis not present

## 2015-08-27 DIAGNOSIS — N136 Pyonephrosis: Secondary | ICD-10-CM | POA: Diagnosis not present

## 2015-08-27 DIAGNOSIS — Z48812 Encounter for surgical aftercare following surgery on the circulatory system: Secondary | ICD-10-CM | POA: Diagnosis not present

## 2015-08-27 DIAGNOSIS — I132 Hypertensive heart and chronic kidney disease with heart failure and with stage 5 chronic kidney disease, or end stage renal disease: Secondary | ICD-10-CM | POA: Diagnosis not present

## 2015-08-27 DIAGNOSIS — Z436 Encounter for attention to other artificial openings of urinary tract: Secondary | ICD-10-CM | POA: Diagnosis not present

## 2015-08-27 DIAGNOSIS — N390 Urinary tract infection, site not specified: Secondary | ICD-10-CM | POA: Diagnosis not present

## 2015-08-27 DIAGNOSIS — G35 Multiple sclerosis: Secondary | ICD-10-CM | POA: Diagnosis not present

## 2015-08-29 DIAGNOSIS — N189 Chronic kidney disease, unspecified: Secondary | ICD-10-CM | POA: Diagnosis not present

## 2015-08-29 DIAGNOSIS — I10 Essential (primary) hypertension: Secondary | ICD-10-CM | POA: Diagnosis not present

## 2015-08-29 DIAGNOSIS — G35 Multiple sclerosis: Secondary | ICD-10-CM | POA: Diagnosis not present

## 2015-08-29 DIAGNOSIS — J8410 Pulmonary fibrosis, unspecified: Secondary | ICD-10-CM | POA: Diagnosis not present

## 2015-08-30 DIAGNOSIS — Z48812 Encounter for surgical aftercare following surgery on the circulatory system: Secondary | ICD-10-CM | POA: Diagnosis not present

## 2015-08-30 DIAGNOSIS — N136 Pyonephrosis: Secondary | ICD-10-CM | POA: Diagnosis not present

## 2015-08-30 DIAGNOSIS — N390 Urinary tract infection, site not specified: Secondary | ICD-10-CM | POA: Diagnosis not present

## 2015-08-30 DIAGNOSIS — Z436 Encounter for attention to other artificial openings of urinary tract: Secondary | ICD-10-CM | POA: Diagnosis not present

## 2015-08-30 DIAGNOSIS — G35 Multiple sclerosis: Secondary | ICD-10-CM | POA: Diagnosis not present

## 2015-08-30 DIAGNOSIS — I132 Hypertensive heart and chronic kidney disease with heart failure and with stage 5 chronic kidney disease, or end stage renal disease: Secondary | ICD-10-CM | POA: Diagnosis not present

## 2015-09-11 ENCOUNTER — Ambulatory Visit (INDEPENDENT_AMBULATORY_CARE_PROVIDER_SITE_OTHER): Payer: Medicare Other | Admitting: Urology

## 2015-09-11 DIAGNOSIS — R339 Retention of urine, unspecified: Secondary | ICD-10-CM

## 2015-09-11 NOTE — H&P (Signed)
Urology History and Physical Exam  CC: Left-sided kidney stones  HPI: 74 year old male with multiple medical problems, presents at this time for percutaneous management of a left UPJ stone approximately 10 mm in size as well as a 9 mm left lower pole stone. The patient recently presented with flank pain and hydronephrosis and the above abnormalities were found. A percutaneous nephrostomy tube was placed. The patient does have a neurogenic bladder and has a chronic indwelling Foley catheter that is managed in our [place] clinic. Because of his calculous disease, with the nephrostomy tube in place, he presents this time for percutaneous management of the stones.  PMH: Past Medical History  Diagnosis Date  . Arthritis   . MS (multiple sclerosis) ([place])   . Insomnia   . PVD (peripheral vascular disease) ([place])   . Carotid artery stenosis   . TIA (transient ischemic attack)   . Chronic indwelling Foley catheter 10/06/2011  . Pulmonary fibrosis ([place]) 10/06/2011  . Dysphagia 10/07/2011  . Tremors of nervous system 10/08/2011  . Hypothyroidism 10/08/2011  . Pulmonary nodule 10/08/2011  . C. difficile colitis 09/2011  . Pneumonia   . Encephalopathy   . Urinary tract infection   . Tremors of nervous system   . Hypokalemia   . Junctional rhythm   . Anemia   . Hypernatremia   . Sacral ulcer   . HTN (hypertension), malignant 10/06/2011  . Sleep apnea   . Neuromuscular disorder ([place])   . Back pain, chronic   . Kidney stones   . High grade dysplasia in colonic adenoma 09/2005  . Gait disorder   . OSA (obstructive sleep apnea)   . Paroxysmal atrial tachycardia ([place])   . Dyslipidemia   . Cerebrovascular disease   . CAD (coronary artery disease)   . Peripheral vascular disease ([place])   . Bilateral carotid bruits   . Colon polyps   . HA (headache)     PSH: Past Surgical History  Procedure Laterality Date  . Inguinal hernia repair  1971    bilateral  . Back surgery  1976/1979  . Colonoscopy   11/2004    Dr. Sharol Roussel sessile polyp splenic flexure, 59mm sessile polyp desc colon, tubulovillous adenoma (bx not removed)  . Colonoscopy  01/2005    poor prep, polyp could not be found  . Colonoscopy  05/2005    with EMR, polypectomy Dr. Olegario Messier, bx showed high grade dysplasia, partially resected  . Colonoscopy  09/2005    Dr. Arsenio Loader, [place] ink tattooing, four villous colon polyp (3 had been missed on previous colonoscopies due to limitations of procedures  . Colon surgery  09/2005    Fleishman: four tubular adenomas, large adenomatous polyp with HIGH GRADE dysplasia  . Appendectomy  09/2005    at time of left hemicolectomy  . Colonoscopy  09/2006    normal TI, no polyps  . Colonoscopy  10/2007    Dr. Imogene Burn distal mammillations, benign bx, normal TI, random bx neg for microscopic colitis  . Cholecystectomy      Dr. Tamala Julian  . Suprapubic catheter insertion      Allergies: Allergies  Allergen Reactions  . Tetracyclines & Related Anaphylaxis and Rash  . Ciprofloxacin     Trouble swallowing    Medications: No prescriptions prior to admission     Social History: Social History   Social History  . Marital Status: Married    Spouse Name: Pricilla Holm  . Number of Children: 2  . Years  of Education: 10   Occupational History  . disability   .     Social History Main Topics  . Smoking status: Former Smoker    Types: Cigarettes  . Smokeless tobacco: Never Used     Comment: quit remote  . Alcohol Use: No  . Drug Use: No  . Sexual Activity: Not on file   Other Topics Concern  . Not on file   Social History Narrative   Patient is married Pricilla Holm) and  Lives w/ wife.   Patient is retired.   Patient has a 10th grade education.   Patient has two children.      Patient drinks about 1-2 sodas daily.   Patient is left handed.     Family History: Family History  Problem Relation Age of Onset  . Colon cancer Neg Hx   . Cirrhosis Brother     etoh  . Stroke  Mother 22  . Coronary artery disease Father 34  . Heart attack Brother   . Multiple sclerosis    . Cancer Sister     Review of Systems: Positive: Chronic urinary retention, weakness, no flank pain. Negative: .  A further 10 point review of systems was negative except what is listed in the HPI.                  Physical Exam: @VITALS2 @ General: No acute distress.  Awake. Head:  Normocephalic.  Atraumatic. ENT:  EOMI.  Mucous membranes moist Neck:  Supple.  No lymphadenopathy. CV:  S1 present. S2 present. Regular rate. Pulmonary: Equal effort bilaterally.  Clear to auscultation bilaterally. Abdomen: Soft.  *tNon-ender to palpation. percutaneous nephrostomy tube present in the left flank Skin:  Normal turgor.  No visible rash. Extremity: No gross deformity of bilateral upper extremities.  No gross deformity of                             lower extremities. Neurologic: Alert. Appropriate mood.  Penis:  Foley catheter in place Urethra: Orthotopic meatus. Scrotum: No lesions.  No ecchymosis.  No erythema. Testicles: Descended bilaterally.  No masses bilaterally. Epididymis: Palpable bilaterally. Nontender to palpation.  Studies:  No results for input(s): HGB, WBC, PLT in the last 72 hours.  No results for input(s): NA, K, CL, CO2, BUN, CREATININE, CALCIUM, GFRNONAA, GFRAA in the last 72 hours.  Invalid input(s): MAGNESIUM   No results for input(s): INR, APTT in the last 72 hours.  Invalid input(s): PT   Invalid input(s): ABG    Assessment:  Left-sided renal calculi-10 mm left UPJ stone treated with nephrostomy tube, 9 mm left lower pole stone  Plan:  Percutaneous nephrolithotomy of the left kidney

## 2015-09-12 ENCOUNTER — Other Ambulatory Visit (HOSPITAL_COMMUNITY): Payer: Self-pay | Admitting: *Deleted

## 2015-09-12 ENCOUNTER — Encounter (HOSPITAL_COMMUNITY): Payer: Self-pay | Admitting: *Deleted

## 2015-09-12 MED ORDER — GENTAMICIN SULFATE 40 MG/ML IJ SOLN
280.0000 mg | INTRAVENOUS | Status: AC
  Administered 2015-09-13: 280 mg via INTRAVENOUS
  Filled 2015-09-12: qty 7

## 2015-09-13 ENCOUNTER — Encounter (HOSPITAL_COMMUNITY)
Admission: RE | Disposition: A | Discharge status: Home / Self Care | Payer: Self-pay | Source: Ambulatory Visit | Attending: Urology

## 2015-09-13 ENCOUNTER — Ambulatory Visit (HOSPITAL_COMMUNITY): Payer: Medicare Other | Admitting: Registered Nurse

## 2015-09-13 ENCOUNTER — Encounter (HOSPITAL_COMMUNITY): Payer: Self-pay | Admitting: *Deleted

## 2015-09-13 ENCOUNTER — Ambulatory Visit (HOSPITAL_COMMUNITY): Payer: Medicare Other

## 2015-09-13 ENCOUNTER — Observation Stay (HOSPITAL_COMMUNITY)
Admission: RE | Admit: 2015-09-13 | Discharge: 2015-09-14 | Disposition: A | Discharge status: Home / Self Care | Payer: Medicare Other | Source: Ambulatory Visit | Attending: Urology | Admitting: Urology

## 2015-09-13 DIAGNOSIS — Z87891 Personal history of nicotine dependence: Secondary | ICD-10-CM | POA: Diagnosis not present

## 2015-09-13 DIAGNOSIS — I471 Supraventricular tachycardia: Secondary | ICD-10-CM | POA: Insufficient documentation

## 2015-09-13 DIAGNOSIS — N200 Calculus of kidney: Secondary | ICD-10-CM | POA: Diagnosis not present

## 2015-09-13 DIAGNOSIS — G35 Multiple sclerosis: Secondary | ICD-10-CM | POA: Diagnosis not present

## 2015-09-13 DIAGNOSIS — E039 Hypothyroidism, unspecified: Secondary | ICD-10-CM | POA: Insufficient documentation

## 2015-09-13 DIAGNOSIS — E785 Hyperlipidemia, unspecified: Secondary | ICD-10-CM | POA: Diagnosis not present

## 2015-09-13 DIAGNOSIS — G4733 Obstructive sleep apnea (adult) (pediatric): Secondary | ICD-10-CM | POA: Diagnosis not present

## 2015-09-13 DIAGNOSIS — I129 Hypertensive chronic kidney disease with stage 1 through stage 4 chronic kidney disease, or unspecified chronic kidney disease: Secondary | ICD-10-CM | POA: Diagnosis not present

## 2015-09-13 DIAGNOSIS — Z87442 Personal history of urinary calculi: Secondary | ICD-10-CM | POA: Insufficient documentation

## 2015-09-13 DIAGNOSIS — Z8673 Personal history of transient ischemic attack (TIA), and cerebral infarction without residual deficits: Secondary | ICD-10-CM | POA: Diagnosis not present

## 2015-09-13 DIAGNOSIS — I6529 Occlusion and stenosis of unspecified carotid artery: Secondary | ICD-10-CM | POA: Insufficient documentation

## 2015-09-13 DIAGNOSIS — I110 Hypertensive heart disease with heart failure: Secondary | ICD-10-CM | POA: Insufficient documentation

## 2015-09-13 DIAGNOSIS — Z9049 Acquired absence of other specified parts of digestive tract: Secondary | ICD-10-CM | POA: Insufficient documentation

## 2015-09-13 DIAGNOSIS — I2510 Atherosclerotic heart disease of native coronary artery without angina pectoris: Secondary | ICD-10-CM | POA: Diagnosis not present

## 2015-09-13 DIAGNOSIS — N189 Chronic kidney disease, unspecified: Secondary | ICD-10-CM | POA: Diagnosis not present

## 2015-09-13 DIAGNOSIS — G4730 Sleep apnea, unspecified: Secondary | ICD-10-CM | POA: Diagnosis not present

## 2015-09-13 DIAGNOSIS — Z79899 Other long term (current) drug therapy: Secondary | ICD-10-CM | POA: Insufficient documentation

## 2015-09-13 DIAGNOSIS — I509 Heart failure, unspecified: Secondary | ICD-10-CM | POA: Diagnosis not present

## 2015-09-13 DIAGNOSIS — I739 Peripheral vascular disease, unspecified: Secondary | ICD-10-CM | POA: Diagnosis not present

## 2015-09-13 DIAGNOSIS — J8410 Pulmonary fibrosis, unspecified: Secondary | ICD-10-CM | POA: Insufficient documentation

## 2015-09-13 DIAGNOSIS — N201 Calculus of ureter: Secondary | ICD-10-CM | POA: Diagnosis not present

## 2015-09-13 HISTORY — PX: KIDNEY STONE SURGERY: SHX686

## 2015-09-13 HISTORY — PX: NEPHROLITHOTOMY: SHX5134

## 2015-09-13 LAB — CBC
HCT: 40.8 % (ref 39.0–52.0)
Hemoglobin: 13.3 g/dL (ref 13.0–17.0)
MCH: 30.2 pg (ref 26.0–34.0)
MCHC: 32.6 g/dL (ref 30.0–36.0)
MCV: 92.7 fL (ref 78.0–100.0)
PLATELETS: 188 10*3/uL (ref 150–400)
RBC: 4.4 MIL/uL (ref 4.22–5.81)
RDW: 13.9 % (ref 11.5–15.5)
WBC: 8.4 10*3/uL (ref 4.0–10.5)

## 2015-09-13 LAB — BASIC METABOLIC PANEL
Anion gap: 10 (ref 5–15)
BUN: 23 mg/dL — ABNORMAL HIGH (ref 6–20)
CHLORIDE: 103 mmol/L (ref 101–111)
CO2: 26 mmol/L (ref 22–32)
CREATININE: 1.72 mg/dL — AB (ref 0.61–1.24)
Calcium: 9.5 mg/dL (ref 8.9–10.3)
GFR calc non Af Amer: 38 mL/min — ABNORMAL LOW (ref >60)
GFR, EST AFRICAN AMERICAN: 44 mL/min — AB (ref >60)
Glucose, Bld: 96 mg/dL (ref 65–99)
Potassium: 4.4 mmol/L (ref 3.5–5.1)
SODIUM: 139 mmol/L (ref 135–145)

## 2015-09-13 LAB — ABO/RH: ABO/RH(D): O POS

## 2015-09-13 LAB — TYPE AND SCREEN
ABO/RH(D): O POS
Antibody Screen: NEGATIVE

## 2015-09-13 SURGERY — NEPHROLITHOTOMY PERCUTANEOUS
Anesthesia: General | Laterality: Left

## 2015-09-13 MED ORDER — ZOLPIDEM TARTRATE 5 MG PO TABS: 5.0000 mg | ORAL_TABLET | Freq: Every evening | ORAL | Status: DC | PRN

## 2015-09-13 MED ORDER — EPHEDRINE SULFATE 50 MG/ML IJ SOLN
INTRAMUSCULAR | Status: DC | PRN
  Administered 2015-09-13 (×2): 5 mg via INTRAVENOUS

## 2015-09-13 MED ORDER — ONDANSETRON HCL 4 MG/2ML IJ SOLN
INTRAMUSCULAR | Status: AC
  Filled 2015-09-13: qty 2

## 2015-09-13 MED ORDER — PROPOFOL 10 MG/ML IV BOLUS
INTRAVENOUS | Status: DC | PRN
Start: 2015-09-13 — End: 2015-09-13
  Administered 2015-09-13: 100 mg via INTRAVENOUS

## 2015-09-13 MED ORDER — SODIUM CHLORIDE 0.9 % IR SOLN
Status: DC | PRN
  Administered 2015-09-13: 6000 mL

## 2015-09-13 MED ORDER — IOHEXOL 300 MG/ML  SOLN
INTRAMUSCULAR | Status: DC | PRN
  Administered 2015-09-13: 25 mL via URETHRAL

## 2015-09-13 MED ORDER — ONDANSETRON HCL 4 MG/2ML IJ SOLN
INTRAMUSCULAR | Status: DC | PRN
  Administered 2015-09-13: 4 mg via INTRAVENOUS

## 2015-09-13 MED ORDER — SUGAMMADEX SODIUM 200 MG/2ML IV SOLN
INTRAVENOUS | Status: AC
  Filled 2015-09-13: qty 2

## 2015-09-13 MED ORDER — SULFAMETHOXAZOLE-TRIMETHOPRIM 800-160 MG PO TABS: 1.0000 | ORAL_TABLET | Freq: Two times a day (BID) | ORAL | Status: DC

## 2015-09-13 MED ORDER — FENTANYL CITRATE (PF) 100 MCG/2ML IJ SOLN
INTRAMUSCULAR | Status: DC | PRN
  Administered 2015-09-13 (×2): 50 ug via INTRAVENOUS

## 2015-09-13 MED ORDER — ROCURONIUM BROMIDE 100 MG/10ML IV SOLN
INTRAVENOUS | Status: DC | PRN
  Administered 2015-09-13: 40 mg via INTRAVENOUS

## 2015-09-13 MED ORDER — LEVOTHYROXINE SODIUM 88 MCG PO TABS
88.0000 ug | ORAL_TABLET | Freq: Every day | ORAL | Status: DC
  Administered 2015-09-14: 88 ug via ORAL
  Filled 2015-09-13: qty 1

## 2015-09-13 MED ORDER — FENTANYL CITRATE (PF) 100 MCG/2ML IJ SOLN
25.0000 ug | INTRAMUSCULAR | Status: DC | PRN
  Administered 2015-09-13 (×3): 50 ug via INTRAVENOUS

## 2015-09-13 MED ORDER — HYDROCODONE-ACETAMINOPHEN 5-325 MG PO TABS
1.0000 | ORAL_TABLET | ORAL | Status: DC | PRN
  Administered 2015-09-13 – 2015-09-14 (×3): 2 via ORAL
  Filled 2015-09-13 (×3): qty 2

## 2015-09-13 MED ORDER — DIAZEPAM 5 MG PO TABS
5.0000 mg | ORAL_TABLET | Freq: Two times a day (BID) | ORAL | Status: DC
  Administered 2015-09-13 – 2015-09-14 (×3): 5 mg via ORAL
  Filled 2015-09-13 (×3): qty 1

## 2015-09-13 MED ORDER — PHENYLEPHRINE HCL 10 MG/ML IJ SOLN
INTRAMUSCULAR | Status: AC
  Filled 2015-09-13: qty 2

## 2015-09-13 MED ORDER — ONDANSETRON HCL 4 MG/2ML IJ SOLN: 4.0000 mg | INTRAMUSCULAR | Status: DC | PRN

## 2015-09-13 MED ORDER — CARVEDILOL 6.25 MG PO TABS
6.2500 mg | ORAL_TABLET | Freq: Two times a day (BID) | ORAL | Status: DC
  Administered 2015-09-14: 6.25 mg via ORAL
  Filled 2015-09-13 (×2): qty 1

## 2015-09-13 MED ORDER — SUGAMMADEX SODIUM 200 MG/2ML IV SOLN
INTRAVENOUS | Status: DC | PRN
  Administered 2015-09-13: 150 mg via INTRAVENOUS

## 2015-09-13 MED ORDER — FENTANYL CITRATE (PF) 100 MCG/2ML IJ SOLN
INTRAMUSCULAR | Status: AC
  Filled 2015-09-13: qty 2

## 2015-09-13 MED ORDER — ROCURONIUM BROMIDE 100 MG/10ML IV SOLN
INTRAVENOUS | Status: AC
  Filled 2015-09-13: qty 1

## 2015-09-13 MED ORDER — LIDOCAINE HCL (CARDIAC) 20 MG/ML IV SOLN
INTRAVENOUS | Status: AC
  Filled 2015-09-13: qty 5

## 2015-09-13 MED ORDER — OXYBUTYNIN CHLORIDE 5 MG PO TABS
5.0000 mg | ORAL_TABLET | Freq: Three times a day (TID) | ORAL | Status: DC | PRN
  Administered 2015-09-14: 5 mg via ORAL
  Filled 2015-09-13: qty 1

## 2015-09-13 MED ORDER — HYDROMORPHONE HCL 1 MG/ML IJ SOLN
0.5000 mg | INTRAMUSCULAR | Status: DC | PRN
  Administered 2015-09-13: 0.5 mg via INTRAVENOUS
  Filled 2015-09-13: qty 1

## 2015-09-13 MED ORDER — VERAPAMIL HCL 40 MG PO TABS
40.0000 mg | ORAL_TABLET | Freq: Two times a day (BID) | ORAL | Status: DC
  Administered 2015-09-13 – 2015-09-14 (×3): 40 mg via ORAL
  Filled 2015-09-13 (×4): qty 1

## 2015-09-13 MED ORDER — LIDOCAINE HCL (CARDIAC) 20 MG/ML IV SOLN
INTRAVENOUS | Status: DC | PRN
  Administered 2015-09-13: 60 mg via INTRAVENOUS

## 2015-09-13 MED ORDER — ACETAMINOPHEN 325 MG PO TABS: 650.0000 mg | ORAL_TABLET | ORAL | Status: DC | PRN

## 2015-09-13 MED ORDER — LACTATED RINGERS IV SOLN: INTRAVENOUS | Status: DC

## 2015-09-13 MED ORDER — CARVEDILOL 6.25 MG PO TABS
6.2500 mg | ORAL_TABLET | Freq: Two times a day (BID) | ORAL | Status: DC
  Administered 2015-09-13: 6.25 mg via ORAL
  Filled 2015-09-13 (×2): qty 1

## 2015-09-13 MED ORDER — LACTATED RINGERS IV SOLN
INTRAVENOUS | Status: DC
  Administered 2015-09-13: 1000 mL via INTRAVENOUS

## 2015-09-13 MED ORDER — SULFAMETHOXAZOLE-TRIMETHOPRIM 400-80 MG PO TABS
1.0000 | ORAL_TABLET | Freq: Two times a day (BID) | ORAL | Status: DC
  Administered 2015-09-13 – 2015-09-14 (×3): 1 via ORAL
  Filled 2015-09-13 (×4): qty 1

## 2015-09-13 MED ORDER — SODIUM CHLORIDE 0.45 % IV SOLN
INTRAVENOUS | Status: DC
  Administered 2015-09-13: 1000 mL via INTRAVENOUS
  Administered 2015-09-13: 19:00:00 via INTRAVENOUS

## 2015-09-13 MED ORDER — DOCUSATE SODIUM 100 MG PO CAPS
100.0000 mg | ORAL_CAPSULE | Freq: Two times a day (BID) | ORAL | Status: DC
  Administered 2015-09-13 – 2015-09-14 (×3): 100 mg via ORAL
  Filled 2015-09-13 (×3): qty 1

## 2015-09-13 MED ORDER — POTASSIUM CHLORIDE CRYS ER 20 MEQ PO TBCR
20.0000 meq | EXTENDED_RELEASE_TABLET | Freq: Every day | ORAL | Status: DC
  Administered 2015-09-13 – 2015-09-14 (×2): 20 meq via ORAL
  Filled 2015-09-13 (×2): qty 1

## 2015-09-13 MED ORDER — PROPOFOL 10 MG/ML IV BOLUS
INTRAVENOUS | Status: AC
  Filled 2015-09-13: qty 20

## 2015-09-13 SURGICAL SUPPLY — 58 items
APL ESCP 34 STRL LF DISP (HEMOSTASIS) ×1
APL SKNCLS STERI-STRIP NONHPOA (GAUZE/BANDAGES/DRESSINGS) ×1
APPLICATOR SURGIFLO ENDO (HEMOSTASIS) ×2 IMPLANT
BAG URINE DRAINAGE (UROLOGICAL SUPPLIES) ×3 IMPLANT
BASKET STONE 1.7 NGAGE (UROLOGICAL SUPPLIES) ×2 IMPLANT
BASKET ZERO TIP NITINOL 2.4FR (BASKET) ×1 IMPLANT
BENZOIN TINCTURE PRP APPL 2/3 (GAUZE/BANDAGES/DRESSINGS) ×4 IMPLANT
BLADE SURG 15 STRL LF DISP TIS (BLADE) ×1 IMPLANT
BLADE SURG 15 STRL SS (BLADE) ×3
BSKT STON RTRVL ZERO TP 2.4FR (BASKET)
CARTRIDGE STONEBREAK CO2 KIDNE (ELECTROSURGICAL) IMPLANT
CATCHER STONE W/TUBE ADAPTER (UROLOGICAL SUPPLIES) ×1 IMPLANT
CATH FOLEY 2W COUNCIL 20FR 5CC (CATHETERS) IMPLANT
CATH IMAGER II 65CM (CATHETERS) ×2 IMPLANT
CATH ROBINSON RED A/P 20FR (CATHETERS) IMPLANT
CATH X-FORCE N30 NEPHROSTOMY (TUBING) ×3 IMPLANT
COVER SURGICAL LIGHT HANDLE (MISCELLANEOUS) ×3 IMPLANT
DRAPE C-ARM 42X120 X-RAY (DRAPES) ×3 IMPLANT
DRAPE CAMERA CLOSED 9X96 (DRAPES) ×1 IMPLANT
DRAPE LINGEMAN PERC (DRAPES) ×3 IMPLANT
DRAPE SURG IRRIG POUCH 19X23 (DRAPES) ×3 IMPLANT
DRSG PAD ABDOMINAL 8X10 ST (GAUZE/BANDAGES/DRESSINGS) ×2 IMPLANT
DRSG TEGADERM 8X12 (GAUZE/BANDAGES/DRESSINGS) ×2 IMPLANT
FIBER LASER FLEXIVA 1000 (UROLOGICAL SUPPLIES) IMPLANT
FIBER LASER FLEXIVA 200 (UROLOGICAL SUPPLIES) IMPLANT
FIBER LASER FLEXIVA 365 (UROLOGICAL SUPPLIES) IMPLANT
FIBER LASER FLEXIVA 550 (UROLOGICAL SUPPLIES) IMPLANT
FIBER LASER TRAC TIP (UROLOGICAL SUPPLIES) IMPLANT
FLOSEAL 10ML (HEMOSTASIS) ×2 IMPLANT
GAUZE SPONGE 4X4 12PLY STRL (GAUZE/BANDAGES/DRESSINGS) ×1 IMPLANT
GLOVE BIOGEL M 8.0 STRL (GLOVE) ×13 IMPLANT
GOWN STRL REUS W/TWL XL LVL3 (GOWN DISPOSABLE) ×7 IMPLANT
GUIDEWIRE AMPLAZ .035X145 (WIRE) ×4 IMPLANT
GUIDEWIRE STR DUAL SENSOR (WIRE) ×2 IMPLANT
KIT BASIN OR (CUSTOM PROCEDURE TRAY) ×3 IMPLANT
MANIFOLD NEPTUNE II (INSTRUMENTS) ×3 IMPLANT
NS IRRIG 1000ML POUR BTL (IV SOLUTION) ×3 IMPLANT
PACK BASIC VI WITH GOWN DISP (CUSTOM PROCEDURE TRAY) ×1 IMPLANT
PACK CYSTO (CUSTOM PROCEDURE TRAY) ×3 IMPLANT
PROBE KIDNEY STONEBRKR 2.0X425 (ELECTROSURGICAL) ×1 IMPLANT
PROBE LITHOCLAST ULTRA 3.8X403 (UROLOGICAL SUPPLIES) IMPLANT
PROBE PNEUMATIC 1.0MMX570MM (UROLOGICAL SUPPLIES) ×1 IMPLANT
SET IRRIG Y TYPE TUR BLADDER L (SET/KITS/TRAYS/PACK) ×1 IMPLANT
SET WARMING FLUID IRRIGATION (MISCELLANEOUS) ×1 IMPLANT
SHEATH PEELAWAY SET 9 (SHEATH) ×4 IMPLANT
SPONGE LAP 4X18 X RAY DECT (DISPOSABLE) ×3 IMPLANT
STENT URET 6FRX24 CONTOUR (STENTS) ×2 IMPLANT
STONE CATCHER W/TUBE ADAPTER (UROLOGICAL SUPPLIES) IMPLANT
SUT SILK 2 0 30  PSL (SUTURE) ×2
SUT SILK 2 0 30 PSL (SUTURE) ×1 IMPLANT
SYR 20CC LL (SYRINGE) ×6 IMPLANT
SYRINGE 10CC LL (SYRINGE) ×3 IMPLANT
TRAY FOLEY BAG SILVER LF 14FR (CATHETERS) ×1 IMPLANT
TRAY FOLEY W/METER SILVER 14FR (SET/KITS/TRAYS/PACK) ×1 IMPLANT
TRAY FOLEY W/METER SILVER 16FR (SET/KITS/TRAYS/PACK) ×1 IMPLANT
TUBING CONNECTING 10 (TUBING) ×5 IMPLANT
TUBING CONNECTING 10' (TUBING) ×2
WATER STERILE IRR 1500ML POUR (IV SOLUTION) ×1 IMPLANT

## 2015-09-13 NOTE — Anesthesia Postprocedure Evaluation (Signed)
Anesthesia Post Note  Patient: Jordan Ward  Procedure(s) Performed: Procedure(s) (LRB): LEFT PERCUTANEOUS NEPHROLITHOTOMY  (Left)  Patient location during evaluation: PACU Anesthesia Type: General Level of consciousness: awake and alert Pain management: pain level controlled Vital Signs Assessment: post-procedure vital signs reviewed and stable Respiratory status: spontaneous breathing, nonlabored ventilation, respiratory function stable and patient connected to nasal cannula oxygen Cardiovascular status: blood pressure returned to baseline and stable Postop Assessment: no signs of nausea or vomiting Anesthetic complications: no    Last Vitals:  Filed Vitals:   09/13/15 1100 09/13/15 1114  BP:  166/93  Pulse:  53  Temp: 36.4 C 36.5 C  Resp:  12    Last Pain:  Filed Vitals:   09/13/15 1222  PainSc: 5                  Breianna Delfino L

## 2015-09-13 NOTE — Progress Notes (Signed)
Pharmacy - Brief Note (antibiotic renal dose adjustment)  Assessment/Plan: 27 YOM s/p Left percutaneous nephrolithotomy with placement of double J stent.  TMP/SMZ double-strength 1 tab BID ordered post-op.  - Based on CrCl ~57ml/min, change TMP/SMZ to single-strength tab 1 BID - If improvement in renal function, Rx will evaluate if appropriate to change back to double strength tab  Thanks, Doreene Eland, PharmD, BCPS.   Pager: [phone] 09/13/2015 12:12 PM

## 2015-09-13 NOTE — Op Note (Addendum)
Preoperative diagnosis:Left renal calculi  Postoperative diagnosis:Same   Procedure: Left percutaneous nephrolithotomy, less than 20 mm stones, left antegrade nephrogram, interpretive fluoroscopy, placement of double-J stentin left ureter  Surgeon: Lillette Boxer. Hershey Knauer, M.D.   Anesthesia: Gen.   Complications: None  Specimen(s): 2 renal calculi, to the patient's wife  Drain(s): 24 cm 6 French contour double-J stent and left ureter, previously placed suprapubic tube  Indications: 74 year old male who recently presented with sepsis and an obstructing left UPJ stone. He had emergent placement of a percutaneous nephrostomy tube by interventional radiology. There was an addition to his 10 mm UPJ stone a 9 mm lower pole stone and, apparently, a 7 mm upper pole stone. The patient was treated appropriately with inpatient management and IV antibiotics. He has been counseled regarding management of his renal calculi. Since he has a percutaneous nephrostomy drain, it was recommended that we proceed with percutaneous nephrolithotomy. The procedure, risks, complications and alternatives have been discussed with the patient and his wife preoperatively. These include but are not limited to anesthetic risk, especially with his concurrent comorbidities, infection, blood loss, significant injury/possible loss of kidney, among others. He understands these and desires to proceed.    Technique and findings: The patient was properly identified and marked in the holding area. He received IV gentamicin. He was taken to the operating room where general endotracheal anesthetic was administered. Suprapubic tube had been placed previously. He was placed in the prone position on the operating table, all points were padded appropriately. His left flank and his left percutaneous drain were prepped. He was draped in the usual manner. Proper timeout was performed.  The nephrostomy tube was used to perform an antegrade  nephrostogram. This revealed a patent, decompressed left collecting system. There was a filling defect in the lower pole calyx. There was adequate antegrade movement of the contrast through the UPJ and into the ureter which appeared of normal caliber and without filling defects. I saw no extravasation, and no other abnormalities.  Following the antegrade nephrostogram, the pigtail catheter was cut. A guidewire was advanced through the pigtail catheter and into the renal pelvis. The pigtail catheter was then removed. I then placed a Kumpe catheter over top of the guidewire, and attempted to pass the guidewire distally through the UPJ. This was difficult. I then removed the floppy tip guidewire and used a sensor-tip guidewire to easily negotiate the ureteropelvic junction, and the guidewire and Kumpe catheter was then advanced all the way to the bladder using fluoroscopic guidance. The Kumpe catheter was removed. I then created an incision around the guidewire, widening the nephrostomy tract. A hemostat was used to assist with this. The peel-away sheath/introducer was then placed over top of the guidewire, and the core removed. A second guidewire was advanced into the bladder using fluoroscopic guidance. The peel-away sheath was then removed. I then passed the 28th French NephroMax balloon over top of the working guidewire. This was advanced into the renal pelvis using fluoroscopic guidance, and the balloon was dilated to 20 atm of pressure for proximally 3 minutes. The nephrostomy sheath was then negotiated over top of the balloon into the renal pelvis using fluoroscopic guidance. The balloon was then removed.  I then passed the rigid nephroscope. The renal pelvic stone was easily identified and extracted. This was a larger of the stones and most likely the one that was in the renal pelvic junction during the patient's initial presentation. I could not identify any other stones in the lower pole  calyceal system  with the rigid scope. I then passed the flexible nephroscope. The upper pole calyces, interpolar calyces and lower pole calyces were all inspected sequentially. There was a medium-size stone in a lower pole calyx. This was extracted with the engage basket. Some tiny fragments in the lower pole calyx with and also removed with this basket. Careful inspection of all other calyces, again sequentially, failed reveal any other stones. I never saw stone in an upper pole calyx there was apparently identified on CT scan. It should be mentioned that CT scan was reviewed before the procedure.  At this point, with thorough inspection of the pelvis and calyceal system having been carried out, including the UPJ, the procedure was terminated. Over the working guidewire, I passed a 24 cm x 6 French contour stent distally into the bladder using fluoroscopic guidance. Following removal of the guidewire, good proximal and distal curls were seen. I measured the distance between the edge of the working calyx in the skin. The sheath was then removed as well as the working guidewire. I then, using the administration trocar, placed FloSeal in between the edge of the working calyx in the skin. 10 mL of FloSeal was used. There was adequate hemostasis. At this point, skin edges were reapproximated using 2-0 silk placed in a vertical mattress fashion. A dry sterile dressing was placed.  The patient tolerated the procedure well. He was awakened, and taken to the PACU in stable condition.

## 2015-09-13 NOTE — Anesthesia Procedure Notes (Signed)
Procedure Name: Intubation Date/Time: 09/13/2015 9:06 AM Performed by: Carleene Cooper A Pre-anesthesia Checklist: Emergency Drugs available, Patient identified, Suction available, Patient being monitored and Timeout performed Patient Re-evaluated:Patient Re-evaluated prior to inductionOxygen Delivery Method: Circle system utilized Preoxygenation: Pre-oxygenation with 100% oxygen Intubation Type: IV induction Ventilation: Mask ventilation without difficulty Laryngoscope Size: Mac and 3 Grade View: Grade I Tube type: Oral Tube size: 7.5 mm Number of attempts: 1 Airway Equipment and Method: Stylet Placement Confirmation: ETT inserted through vocal cords under direct vision,  positive ETCO2 and breath sounds checked- equal and bilateral Secured at: 21 cm Tube secured with: Tape Dental Injury: Teeth and Oropharynx as per pre-operative assessment

## 2015-09-13 NOTE — Transfer of Care (Signed)
Immediate Anesthesia Transfer of Care Note  Patient: Jordan Ward  Procedure(s) Performed: Procedure(s): LEFT PERCUTANEOUS NEPHROLITHOTOMY  (Left)  Patient Location: PACU  Anesthesia Type:General  Level of Consciousness: awake, alert , oriented and patient cooperative  Airway & Oxygen Therapy: Patient Spontanous Breathing and Patient connected to face mask oxygen  Post-op Assessment: Report given to RN, Post -op Vital signs reviewed and stable and Patient moving all extremities  Post vital signs: reviewed and stable  Last Vitals:  Filed Vitals:   09/13/15 0633  BP: 160/86  Pulse: 63  Temp: 36.7 C  Resp: 16    Complications: No apparent anesthesia complications

## 2015-09-13 NOTE — Anesthesia Preprocedure Evaluation (Addendum)
Anesthesia Evaluation  Patient identified by MRN, date of birth, ID band Patient awake    Reviewed: Allergy & Precautions, H&P , NPO status , Patient's Chart, lab work & pertinent test results, reviewed documented beta blocker date and time   Airway Mallampati: II  TM Distance: >3 FB Neck ROM: full    Dental  (+) Dental Advisory Given, Caps, Missing All left upper front teeth missing and right upper front all capped:   Pulmonary sleep apnea , former smoker,  Pulmonary fibrosis   Pulmonary exam normal breath sounds clear to auscultation       Cardiovascular Exercise Tolerance: Poor hypertension, Pt. on home beta blockers + CAD, + Peripheral Vascular Disease and +CHF  Normal cardiovascular exam Rhythm:regular Rate:Normal  Paroxysmal atrial tachycardia. Junctional rhythm   Neuro/Psych MS. Carotid stenosis TIA Neuromuscular disease negative psych ROS   GI/Hepatic negative GI ROS, Neg liver ROS,   Endo/Other  negative endocrine ROSHypothyroidism   Renal/GU Renal diseaseCRT 1.72  negative genitourinary   Musculoskeletal   Abdominal   Peds  Hematology negative hematology ROS (+)   Anesthesia Other Findings   Reproductive/Obstetrics negative OB ROS                           Anesthesia Physical Anesthesia Plan  ASA: IV  Anesthesia Plan: General   Post-op Pain Management:    Induction: Intravenous  Airway Management Planned: Oral ETT  Additional Equipment:   Intra-op Plan:   Post-operative Plan: Extubation in OR  Informed Consent: I have reviewed the patients History and Physical, chart, labs and discussed the procedure including the risks, benefits and alternatives for the proposed anesthesia with the patient or authorized representative who has indicated his/her understanding and acceptance.   Dental Advisory Given  Plan Discussed with: CRNA and Surgeon  Anesthesia Plan Comments:        Anesthesia Quick Evaluation

## 2015-09-13 NOTE — Discharge Instructions (Signed)
DISCHARGE INSTRUCTIONS FOR PERCUTANEOUS STONE SURGERY MEDICATIONS:  1. DO NOT RESUME YOUR IBUPROFEN, or any other medicines like aspirin, motrin, excedrin, advil, aleve, vitamin E, fish oil as these can all cause bleeding x 10 days.  2. Resume all your other meds from home - except do not take any other pain meds that you may have at home.  ACTIVITY 1. No strenuous activity, sexual activity, or lifting greater than 10 pounds for 2 weeks. 2. No driving while on narcotic pain medications 3. Drink plenty of water 4. Continue to walk at home - you can still get blood clots when you are at home, so keep active, but don't over do it. 5. May return to work in 1 week (but not heavy or strenuous activity).   BATHING You can shower.  Cover your wound with a dressing and remove the dressing immediately after the shower.  Do not submerge wound under water.   WOUND CARE Your wound will drain bloody fluid and may do so for 7-14 days. You have 2 options for dressings:  1. You may use gauze and tape to dress your wound.  If you choose this method, then change the dressing as it becomes soaked.  Change it at least once daily until it stops draining. You may switch to a Band Aid once drainage stops. 2. If drainage is copious, you may use an ostomy device.  This is a bag with an andhesive circle.  The circle has a hole in the middle of it and you cut the hole to the size needed to fit the wound.  This will collect the drainage in the bag and allow you to drain the bag as needed.   SIGNS/SYMPTOMS TO CALL: 1. Please call us if you have a fever greater than 101.5, uncontrolled nausea/vomiting, uncontrolled pain, dizziness, unable to urinate, bloody urine, chest pain, shortness of breath, leg swelling, leg pain, redness around wound, drainage from wound, or any other concerns or questions. 2. You can reach [place] at [phone]. FOLLOW-UP

## 2015-09-14 DIAGNOSIS — N200 Calculus of kidney: Secondary | ICD-10-CM | POA: Diagnosis not present

## 2015-09-14 DIAGNOSIS — G35 Multiple sclerosis: Secondary | ICD-10-CM | POA: Diagnosis not present

## 2015-09-14 DIAGNOSIS — I110 Hypertensive heart disease with heart failure: Secondary | ICD-10-CM | POA: Diagnosis not present

## 2015-09-14 DIAGNOSIS — J8410 Pulmonary fibrosis, unspecified: Secondary | ICD-10-CM | POA: Diagnosis not present

## 2015-09-14 DIAGNOSIS — E039 Hypothyroidism, unspecified: Secondary | ICD-10-CM | POA: Diagnosis not present

## 2015-09-14 DIAGNOSIS — I739 Peripheral vascular disease, unspecified: Secondary | ICD-10-CM | POA: Diagnosis not present

## 2015-09-14 LAB — HEMOGLOBIN AND HEMATOCRIT, BLOOD
HEMATOCRIT: 34.1 % — AB (ref 39.0–52.0)
Hemoglobin: 11.3 g/dL — ABNORMAL LOW (ref 13.0–17.0)

## 2015-09-14 MED ORDER — SULFAMETHOXAZOLE-TRIMETHOPRIM 400-80 MG PO TABS: 1.0000 | ORAL_TABLET | Freq: Two times a day (BID) | ORAL | Status: DC

## 2015-09-14 MED ORDER — HYDROCODONE-ACETAMINOPHEN 5-325 MG PO TABS: 1.0000 | ORAL_TABLET | ORAL | Status: DC | PRN

## 2015-09-14 NOTE — Progress Notes (Signed)
Pt discharged to home with all belongings. Suprapubic catheter changed to leg bag and IV discontinued. Site is clean, dry and intact. Pt taken out via wheelchair to private vehicle.

## 2015-09-14 NOTE — Discharge Summary (Signed)
Patient ID: Jordan Ward MRN: [id number] DOB/AGE: 1941-04-09 74 y.o.  Admit date: 09/13/2015 Discharge date: 09/14/2015  Primary Care Physician:  Jordan Bogus, MD  Discharge Diagnoses:   Present on Admission:  **None**  Consults:  None     Discharge Medications:   Medication List    STOP taking these medications        aspirin 81 MG chewable tablet      TAKE these medications        carvedilol 6.25 MG tablet  Commonly known as:  COREG  Take 1 tablet (6.25 mg total) by mouth 2 (two) times daily with a meal.     diazepam 5 MG tablet  Commonly known as:  VALIUM  Take 1 tablet (5 mg total) by mouth 2 (two) times daily.     HYDROcodone-acetaminophen 7.5-325 MG tablet  Commonly known as:  NORCO  Take 1 tablet by mouth 2 (two) times daily. Max APAP 3 GM IN 24 HOURS FROM ALL SOURCES     HYDROcodone-acetaminophen 5-325 MG tablet  Commonly known as:  NORCO/VICODIN  Take 1-2 tablets by mouth every 4 (four) hours as needed for moderate pain.     levothyroxine 88 MCG tablet  Commonly known as:  SYNTHROID, LEVOTHROID  Take 88 mcg by mouth daily before breakfast.     potassium chloride SA 20 MEQ tablet  Commonly known as:  K-DUR,KLOR-CON  Take 20 mEq by mouth daily.     sulfamethoxazole-trimethoprim 400-80 MG tablet  Commonly known as:  BACTRIM,SEPTRA  Take 1 tablet by mouth 2 (two) times daily.     verapamil 40 MG tablet  Commonly known as:  CALAN  Take 40 mg by mouth 2 (two) times daily.         Significant Diagnostic Studies:  Dg C-arm 1-60 Min-no Report  09/13/2015  CLINICAL DATA: surgery C-ARM 1-60 MINUTES Fluoroscopy was utilized by the requesting physician.  No radiographic interpretation.    Brief H and P: For complete details please refer to admission H and P, but in brief the pt is admitted for percutaneous mgmt of left renal stones.  Hospital Course:  Active Problems:   Kidney stones  Jordan Ward was admitted to the hospital following left  percutaneous nephrolithotomy. This was a tubeless procedure. He had no postoperative difficulties and was discharged home on postoperative day #1.  Day of Discharge BP 125/73 mmHg  Pulse 56  Temp(Src) 97.9 F (36.6 C) (Oral)  Resp 16  Ht 5\' 7"  (1.702 m)  Wt 58.2 kg (128 lb 4.9 oz)  BMI 20.09 kg/m2  SpO2 95%  Results for orders placed or performed during the hospital encounter of 09/13/15 (from the past 24 hour(s))  Hemoglobin and hematocrit, blood     Status: Abnormal   Collection Time: 09/14/15  5:18 AM  Result Value Ref Range   Hemoglobin 11.3 (L) 13.0 - 17.0 g/dL   HCT 34.1 (L) 39.0 - 52.0 %    Physical Exam: General: Alert and awake oriented x3 not in any acute distress. HEENT: anicteric sclera, pupils reactive to light and accommodation CVS: S1-S2 clear no murmur rubs or gallops Chest: clear to auscultation bilaterally, no wheezing rales or rhonchi Abdomen: soft nontender, nondistended, normal bowel sounds, no organomegaly Extremities: no cyanosis, clubbing or edema noted bilaterally Neuro: Cranial nerves II-XII intact, no focal neurological deficits  Disposition:  Home  Diet:  No restrictions  Activity:  No restrictions   Disposition and Follow-up:     Discharge Instructions  Discharge patient    Complete by:  As directed             Follow-up is scheduled in early January  TESTS THAT NEED FOLLOW-UP  None  DISCHARGE FOLLOW-UP   Time spent on Discharge:  15 minutes  Signed: Jorja Ward 09/14/2015, 8:03 AM

## 2015-10-02 ENCOUNTER — Ambulatory Visit (INDEPENDENT_AMBULATORY_CARE_PROVIDER_SITE_OTHER): Payer: Medicare Other | Admitting: Urology

## 2015-10-02 DIAGNOSIS — N312 Flaccid neuropathic bladder, not elsewhere classified: Secondary | ICD-10-CM | POA: Diagnosis not present

## 2015-10-02 DIAGNOSIS — N200 Calculus of kidney: Secondary | ICD-10-CM

## 2015-10-17 DIAGNOSIS — N390 Urinary tract infection, site not specified: Secondary | ICD-10-CM | POA: Diagnosis not present

## 2015-10-17 DIAGNOSIS — G35 Multiple sclerosis: Secondary | ICD-10-CM | POA: Diagnosis not present

## 2015-10-17 DIAGNOSIS — J209 Acute bronchitis, unspecified: Secondary | ICD-10-CM | POA: Diagnosis not present

## 2015-10-17 DIAGNOSIS — J8410 Pulmonary fibrosis, unspecified: Secondary | ICD-10-CM | POA: Diagnosis not present

## 2015-11-06 ENCOUNTER — Ambulatory Visit (INDEPENDENT_AMBULATORY_CARE_PROVIDER_SITE_OTHER): Payer: Medicare Other | Admitting: Urology

## 2015-11-06 DIAGNOSIS — N200 Calculus of kidney: Secondary | ICD-10-CM | POA: Diagnosis not present

## 2015-11-06 DIAGNOSIS — R339 Retention of urine, unspecified: Secondary | ICD-10-CM | POA: Diagnosis not present

## 2015-11-16 ENCOUNTER — Ambulatory Visit: Payer: Medicare Other | Admitting: Neurology

## 2015-12-04 ENCOUNTER — Ambulatory Visit (INDEPENDENT_AMBULATORY_CARE_PROVIDER_SITE_OTHER): Payer: Medicare Other | Admitting: Urology

## 2015-12-04 DIAGNOSIS — R339 Retention of urine, unspecified: Secondary | ICD-10-CM | POA: Diagnosis not present

## 2016-01-09 ENCOUNTER — Ambulatory Visit (INDEPENDENT_AMBULATORY_CARE_PROVIDER_SITE_OTHER): Payer: Medicare Other | Admitting: Urology

## 2016-01-09 DIAGNOSIS — N312 Flaccid neuropathic bladder, not elsewhere classified: Secondary | ICD-10-CM | POA: Diagnosis not present

## 2016-01-15 DIAGNOSIS — I10 Essential (primary) hypertension: Secondary | ICD-10-CM | POA: Diagnosis not present

## 2016-01-15 DIAGNOSIS — N189 Chronic kidney disease, unspecified: Secondary | ICD-10-CM | POA: Diagnosis not present

## 2016-01-15 DIAGNOSIS — G35 Multiple sclerosis: Secondary | ICD-10-CM | POA: Diagnosis not present

## 2016-01-15 DIAGNOSIS — J8410 Pulmonary fibrosis, unspecified: Secondary | ICD-10-CM | POA: Diagnosis not present

## 2016-02-01 ENCOUNTER — Ambulatory Visit (INDEPENDENT_AMBULATORY_CARE_PROVIDER_SITE_OTHER): Payer: Medicare Other | Admitting: Urology

## 2016-02-01 DIAGNOSIS — R339 Retention of urine, unspecified: Secondary | ICD-10-CM

## 2016-02-06 ENCOUNTER — Other Ambulatory Visit: Payer: Self-pay | Admitting: Neurology

## 2016-02-07 ENCOUNTER — Telehealth: Payer: Self-pay | Admitting: Neurology

## 2016-02-07 NOTE — Telephone Encounter (Signed)
Message For: FAX TO [facility]           Taken 11-MAY-17 at 10:52AM by ELS ------------------------------------------------------------  Jordan Ward                  CID  [id number]   Patient  SAME                  Pt's Dr  Jannifer Franklin        Area Code  336  Phone#  [phone] *  DOB  June 03, 2041      RE  NEEDS A REFILL, PHARMACY SAID THEY FAXED          SOMETHING TO YOU YESTERDAY & HAVEN'T HEARD BACK YET   Disp:Y/N  Y  If Y = C/B If No Response In 54minutes  ============================================================

## 2016-02-07 NOTE — Telephone Encounter (Signed)
Diazepam rx faxed to pharmacy this morning.

## 2016-02-07 NOTE — Telephone Encounter (Signed)
Rx printed, signed, faxed to pharmacy. 

## 2016-03-03 ENCOUNTER — Other Ambulatory Visit: Payer: Self-pay | Admitting: Neurology

## 2016-03-12 ENCOUNTER — Ambulatory Visit (INDEPENDENT_AMBULATORY_CARE_PROVIDER_SITE_OTHER): Payer: Medicare Other | Admitting: Urology

## 2016-03-12 DIAGNOSIS — N312 Flaccid neuropathic bladder, not elsewhere classified: Secondary | ICD-10-CM

## 2016-04-08 ENCOUNTER — Ambulatory Visit (INDEPENDENT_AMBULATORY_CARE_PROVIDER_SITE_OTHER): Payer: Medicare Other | Admitting: Urology

## 2016-04-08 DIAGNOSIS — N319 Neuromuscular dysfunction of bladder, unspecified: Secondary | ICD-10-CM | POA: Diagnosis not present

## 2016-04-08 DIAGNOSIS — N312 Flaccid neuropathic bladder, not elsewhere classified: Secondary | ICD-10-CM | POA: Diagnosis not present

## 2016-04-08 DIAGNOSIS — N200 Calculus of kidney: Secondary | ICD-10-CM

## 2016-04-10 ENCOUNTER — Other Ambulatory Visit: Payer: Self-pay | Admitting: Urology

## 2016-04-10 ENCOUNTER — Ambulatory Visit (HOSPITAL_COMMUNITY)
Admission: RE | Admit: 2016-04-10 | Discharge: 2016-04-10 | Disposition: A | Discharge status: Home / Self Care | Payer: Medicare Other | Source: Ambulatory Visit | Attending: Urology | Admitting: Urology

## 2016-04-10 DIAGNOSIS — N200 Calculus of kidney: Secondary | ICD-10-CM

## 2016-05-09 ENCOUNTER — Ambulatory Visit (INDEPENDENT_AMBULATORY_CARE_PROVIDER_SITE_OTHER): Payer: Medicare Other | Admitting: Urology

## 2016-05-09 DIAGNOSIS — N319 Neuromuscular dysfunction of bladder, unspecified: Secondary | ICD-10-CM | POA: Diagnosis not present

## 2016-05-09 DIAGNOSIS — N312 Flaccid neuropathic bladder, not elsewhere classified: Secondary | ICD-10-CM | POA: Diagnosis not present

## 2016-05-29 ENCOUNTER — Other Ambulatory Visit: Payer: Self-pay

## 2016-06-10 ENCOUNTER — Ambulatory Visit (INDEPENDENT_AMBULATORY_CARE_PROVIDER_SITE_OTHER): Payer: Medicare Other | Admitting: Urology

## 2016-06-10 DIAGNOSIS — R339 Retention of urine, unspecified: Secondary | ICD-10-CM

## 2016-06-12 DIAGNOSIS — G35 Multiple sclerosis: Secondary | ICD-10-CM | POA: Diagnosis not present

## 2016-06-12 DIAGNOSIS — M545 Low back pain: Secondary | ICD-10-CM | POA: Diagnosis not present

## 2016-06-12 DIAGNOSIS — N189 Chronic kidney disease, unspecified: Secondary | ICD-10-CM | POA: Diagnosis not present

## 2016-06-12 DIAGNOSIS — N390 Urinary tract infection, site not specified: Secondary | ICD-10-CM | POA: Diagnosis not present

## 2016-07-08 ENCOUNTER — Ambulatory Visit (INDEPENDENT_AMBULATORY_CARE_PROVIDER_SITE_OTHER): Payer: Medicare Other | Admitting: Urology

## 2016-07-08 DIAGNOSIS — R338 Other retention of urine: Secondary | ICD-10-CM | POA: Diagnosis not present

## 2016-07-17 ENCOUNTER — Ambulatory Visit: Payer: Medicare Other | Admitting: Adult Health

## 2016-08-05 ENCOUNTER — Ambulatory Visit (INDEPENDENT_AMBULATORY_CARE_PROVIDER_SITE_OTHER): Payer: Medicare Other | Admitting: Urology

## 2016-08-05 ENCOUNTER — Telehealth: Payer: Self-pay | Admitting: Neurology

## 2016-08-05 DIAGNOSIS — R339 Retention of urine, unspecified: Secondary | ICD-10-CM

## 2016-08-05 MED ORDER — DIAZEPAM 5 MG PO TABS: 5.0000 mg | ORAL_TABLET | Freq: Two times a day (BID) | ORAL | 0 refills | Status: DC

## 2016-08-05 NOTE — Telephone Encounter (Signed)
Pt has f/u appt r/s w/ Megan on 08/19/16. 30 day rx printed, awaiting signature.

## 2016-08-05 NOTE — Telephone Encounter (Signed)
Accidentally signed by wrong MD. Re-printed for correct signature.

## 2016-08-05 NOTE — Telephone Encounter (Signed)
Patient called to request refill of diazepam (VALIUM) 5 MG tablet

## 2016-08-05 NOTE — Addendum Note (Signed)
Addended by: Monte Fantasia on: 08/05/2016 11:08 AM   Modules accepted: Orders

## 2016-08-19 ENCOUNTER — Ambulatory Visit (INDEPENDENT_AMBULATORY_CARE_PROVIDER_SITE_OTHER): Payer: Medicare Other | Admitting: Adult Health

## 2016-08-19 ENCOUNTER — Encounter: Payer: Self-pay | Admitting: Adult Health

## 2016-08-19 VITALS — BP 140/74 | HR 48 | Wt 134.0 lb

## 2016-08-19 DIAGNOSIS — F411 Generalized anxiety disorder: Secondary | ICD-10-CM

## 2016-08-19 DIAGNOSIS — R51 Headache: Secondary | ICD-10-CM | POA: Diagnosis not present

## 2016-08-19 DIAGNOSIS — R269 Unspecified abnormalities of gait and mobility: Secondary | ICD-10-CM

## 2016-08-19 DIAGNOSIS — R519 Headache, unspecified: Secondary | ICD-10-CM

## 2016-08-19 MED ORDER — DIAZEPAM 5 MG PO TABS: 5.0000 mg | ORAL_TABLET | Freq: Two times a day (BID) | ORAL | 5 refills | Status: DC

## 2016-08-19 NOTE — Patient Instructions (Signed)
Continue Valium  If your symptoms worsen or you develop new symptoms please let us know.

## 2016-08-19 NOTE — Progress Notes (Signed)
PATIENT: Jordan Ward DOB: 12/23/1940  REASON FOR VISIT: follow up HISTORY FROM: patient  HISTORY OF PRESENT ILLNESS: Jordan Ward is a 75 year old male with a history of cerebrovascular disease. He returns today for follow-up. Overall he feels that he is doing well. He continues to have a suprapubic catheter. He states more recently he had a kidney stone that had to be crushed. Denies any changes with his swallowing. He states he is no longer on thickened liquids. He is careful about what he eats. For example he cannot swallow chips, peanuts and candy. Denies any falls. He is currently using a walker. He states that his headaches are under good control. He has not had but one headache in the last 6 months. He continues to use Valium for anxiety. Denies any new neurological symptoms. He returns today for an evaluation.  HISTORY 07/18/15: Jordan Ward is a 75 year old right-handed white male with a history of cerebrovascular disease, some question of multiple sclerosis in the past. The patient has extensive white matter changes on MRI of the brain, and he has a chronic gait disorder associated with this. The patient also has some problems with dysphagia, and a neurogenic bladder. The patient has an indwelling catheter, and he has intermittent problems with urinary tract infections. He has been seen by speech therapy previously for dysphagia, he is on thickened liquids, and he is doing well with this. The patient has to crush his medications in order to take them. The patient uses a walker for ambulation, he is doing fairly well with this, he has not had any falls since last year. The wife indicates that his ability to ambulate has actually been better recently than it has been in several years. He does have headaches off and on, he may have 2 or 3 headaches a month, he is able to get improvement in the headaches quickly. He returns to the office today for an evaluation.  REVIEW OF SYSTEMS: Out of a complete  14 system review of symptoms, the patient complains only of the following symptoms, and all other reviewed systems are negative.  Eye redness, double vision, runny nose, trouble swallowing, aching muscles, walking difficulty, headache, numbness, cold intolerance  ALLERGIES: Allergies  Allergen Reactions  . Tetracyclines & Related Anaphylaxis and Rash  . Ciprofloxacin     Trouble swallowing unknown reaction according to wife     HOME MEDICATIONS: Outpatient Medications Prior to Visit  Medication Sig Dispense Refill  . carvedilol (COREG) 6.25 MG tablet Take 1 tablet (6.25 mg total) by mouth 2 (two) times daily with a meal.    . diazepam (VALIUM) 5 MG tablet Take 1 tablet (5 mg total) by mouth 2 (two) times daily. 60 tablet 0  . HYDROcodone-acetaminophen (NORCO) 7.5-325 MG per tablet Take 1 tablet by mouth 2 (two) times daily. Max APAP 3 GM IN 24 HOURS FROM ALL SOURCES (Patient taking differently: Take 1 tablet by mouth every 6 (six) hours as needed for moderate pain. Max APAP 3 GM IN 24 HOURS FROM ALL SOURCES) 60 tablet 0  . levothyroxine (SYNTHROID, LEVOTHROID) 88 MCG tablet Take 88 mcg by mouth daily before breakfast.    . potassium chloride SA (K-DUR,KLOR-CON) 20 MEQ tablet Take 20 mEq by mouth daily.    Marland Kitchen sulfamethoxazole-trimethoprim (BACTRIM,SEPTRA) 400-80 MG tablet Take 1 tablet by mouth 2 (two) times daily. 10 tablet 0  . verapamil (CALAN) 40 MG tablet Take 40 mg by mouth 2 (two) times daily.    Marland Kitchen  HYDROcodone-acetaminophen (NORCO/VICODIN) 5-325 MG tablet Take 1-2 tablets by mouth every 4 (four) hours as needed for moderate pain. 30 tablet 0   No facility-administered medications prior to visit.     PAST MEDICAL HISTORY: Past Medical History:  Diagnosis Date  . Anemia   . Arthritis   . Back pain, chronic   . Bilateral carotid bruits   . C. difficile colitis 09/2011  . CAD (coronary artery disease)   . Carotid artery stenosis   . Cerebrovascular disease   . Colon polyps   .  Dyslipidemia   . Dysphagia 10/07/2011  . Encephalopathy   . Gait disorder   . HA (headache)   . High grade dysplasia in colonic adenoma 09/2005  . HTN (hypertension), malignant 10/06/2011  . Hypernatremia   . Hypokalemia   . Hypothyroidism 10/08/2011  . Insomnia   . Junctional rhythm   . Kidney stones   . MS (multiple sclerosis) ([place])   . Neuromuscular disorder ([place])   . OSA (obstructive sleep apnea)   . Paroxysmal atrial tachycardia ([place])   . Peripheral vascular disease ([place])   . Pneumonia 4 yrs ago  . Pulmonary fibrosis ([place]) 10/06/2011  . Pulmonary nodule 10/08/2011  . PVD (peripheral vascular disease) ([place])   . Sacral ulcer ([place])   . Sleep apnea   . TIA (transient ischemic attack)   . Tremors of nervous system 10/08/2011  . Tremors of nervous system   . Urinary tract infection     PAST SURGICAL HISTORY: Past Surgical History:  Procedure Laterality Date  . APPENDECTOMY  09/2005   at time of left hemicolectomy  . BACK SURGERY  1976/1979   lower  . CHOLECYSTECTOMY     Dr. Tamala Julian  . COLON SURGERY  09/2005   [place]: four tubular adenomas, large adenomatous polyp with HIGH GRADE dysplasia  . COLONOSCOPY  11/2004   Dr. Sharol Roussel sessile polyp splenic flexure, 58mm sessile polyp desc colon, tubulovillous adenoma (bx not removed)  . COLONOSCOPY  01/2005   poor prep, polyp could not be found  . COLONOSCOPY  05/2005   with EMR, polypectomy Dr. Olegario Messier, bx showed high grade dysplasia, partially resected  . COLONOSCOPY  09/2005   Dr. Arsenio Loader, [place] ink tattooing, four villous colon polyp (3 had been missed on previous colonoscopies due to limitations of procedures  . COLONOSCOPY  09/2006   normal TI, no polyps  . COLONOSCOPY  10/2007   Dr. Imogene Burn distal mammillations, benign bx, normal TI, random bx neg for microscopic colitis  . INGUINAL HERNIA REPAIR  1971   bilateral  . KIDNEY STONE SURGERY  09/13/2015  . NEPHROLITHOTOMY Left 09/13/2015   Procedure: LEFT PERCUTANEOUS  NEPHROLITHOTOMY ;  Surgeon: Franchot Gallo, MD;  Location: [facility];  Service: Urology;  Laterality: Left;  . SUPRAPUBIC CATHETER INSERTION      FAMILY HISTORY: Family History  Problem Relation Age of Onset  . Cirrhosis Brother     etoh  . Stroke Mother 16  . Coronary artery disease Father 13  . Heart attack Brother   . Cancer Sister   . Multiple sclerosis    . Colon cancer Neg Hx     SOCIAL HISTORY: Social History   Social History  . Marital status: Married    Spouse name: Pricilla Holm  . Number of children: 2  . Years of education: 10   Occupational History  . disability   .  Retired   Social History Main Topics  . Smoking status: Former  Smoker    Types: Cigarettes  . Smokeless tobacco: Never Used     Comment: quit remote  . Alcohol use No  . Drug use: No  . Sexual activity: Not on file   Other Topics Concern  . Not on file   Social History Narrative   Patient is married Pricilla Holm) and  Lives w/ wife.   Patient is retired.   Patient has a 10th grade education.   Patient has two children.      Patient drinks about 1-2 sodas daily.   Patient is left handed.       PHYSICAL EXAM  Vitals:   08/19/16 1128 08/19/16 1131  BP: (!) 194/76 140/74  Pulse: (!) 49 (!) 48  Weight: 134 lb (60.8 kg)    Body mass index is 20.99 kg/m.  Generalized: Well developed, in no acute distress   Neurological examination  Mentation: Alert oriented to time, place, history taking. Follows all commands speech and language fluent Cranial nerve II-XII: Pupils were equal round reactive to light. Extraocular movements were full, visual field were full on confrontational test. Facial sensation and strength were normal. Uvula tongue midline. Head turning and shoulder shrug  were normal and symmetric. Motor: The motor testing reveals 5 over 5 strength of all 4 extremities. Good symmetric motor tone is noted throughout.  Sensory: Sensory testing is intact to soft touch on all 4 extremities.  No evidence of extinction is noted.  Coordination: Cerebellar testing reveals good finger-nose-finger and heel-to-shin bilaterally.  Gait and station: Patient uses a walker when ambulating. Tandem gait not attempted. Reflexes: Deep tendon reflexes are symmetric and normal bilaterally.   DIAGNOSTIC DATA (LABS, IMAGING, TESTING) - I reviewed patient records, labs, notes, testing and imaging myself where available.  Lab Results  Component Value Date   WBC 8.4 09/13/2015   HGB 11.3 (L) 09/14/2015   HCT 34.1 (L) 09/14/2015   MCV 92.7 09/13/2015   PLT 188 09/13/2015      Component Value Date/Time   NA 139 09/13/2015 0700   K 4.4 09/13/2015 0700   CL 103 09/13/2015 0700   CO2 26 09/13/2015 0700   GLUCOSE 96 09/13/2015 0700   BUN 23 (H) 09/13/2015 0700   CREATININE 1.72 (H) 09/13/2015 0700   CALCIUM 9.5 09/13/2015 0700   PROT 5.6 (L) 08/06/2015 0319   ALBUMIN 2.5 (L) 08/08/2015 0249   AST 17 08/06/2015 0319   ALT 10 (L) 08/06/2015 0319   ALKPHOS 69 08/06/2015 0319   BILITOT 0.7 08/06/2015 0319   GFRNONAA 38 (L) 09/13/2015 0700   GFRAA 44 (L) 09/13/2015 0700        ASSESSMENT AND PLAN 75 y.o. year old male  has a past medical history of Anemia; Arthritis; Back pain, chronic; Bilateral carotid bruits; C. difficile colitis (09/2011); CAD (coronary artery disease); Carotid artery stenosis; Cerebrovascular disease; Colon polyps; Dyslipidemia; Dysphagia (10/07/2011); Encephalopathy; Gait disorder; HA (headache); High grade dysplasia in colonic adenoma (09/2005); HTN (hypertension), malignant (10/06/2011); Hypernatremia; Hypokalemia; Hypothyroidism (10/08/2011); Insomnia; Junctional rhythm; Kidney stones; MS (multiple sclerosis) ([place]); Neuromuscular disorder ([place]); OSA (obstructive sleep apnea); Paroxysmal atrial tachycardia ([place]); Peripheral vascular disease ([place]); Pneumonia (4 yrs ago); Pulmonary fibrosis ([place]) (10/06/2011); Pulmonary nodule (10/08/2011); PVD (peripheral vascular disease) ([place]);  Sacral ulcer ([place]); Sleep apnea; TIA (transient ischemic attack); Tremors of nervous system (10/08/2011); Tremors of nervous system; and Urinary tract infection. here with:  1. Gait disorder  2. Abnormal MRI of the brain  3. Headache   Overall the patient has  remained stable. His headaches are under relatively good control. He continues to use Valium 5 mg twice a day for anxiety. I will refill this today. Patient advised that if his symptoms worsen or he develops any new symptoms he should let us know. Will follow-up in one year or sooner if needed.    Ward Givens, MSN, NP-C 08/19/2016, 11:39 AM [facility] [address] [place], [place] [address] [phone]

## 2016-08-19 NOTE — Progress Notes (Signed)
I have read the note, and I agree with the clinical assessment and plan.  Altus Zaino KEITH   

## 2016-08-23 ENCOUNTER — Inpatient Hospital Stay (HOSPITAL_COMMUNITY)
Admission: EM | Admit: 2016-08-23 | Discharge: 2016-08-27 | DRG: 690 | Disposition: A | Discharge status: Home / Self Care | Payer: Medicare Other | Attending: Internal Medicine | Admitting: Internal Medicine

## 2016-08-23 ENCOUNTER — Emergency Department (HOSPITAL_COMMUNITY): Payer: Medicare Other

## 2016-08-23 ENCOUNTER — Encounter (HOSPITAL_COMMUNITY): Payer: Self-pay | Admitting: *Deleted

## 2016-08-23 DIAGNOSIS — I2510 Atherosclerotic heart disease of native coronary artery without angina pectoris: Secondary | ICD-10-CM | POA: Diagnosis present

## 2016-08-23 DIAGNOSIS — D696 Thrombocytopenia, unspecified: Secondary | ICD-10-CM | POA: Diagnosis not present

## 2016-08-23 DIAGNOSIS — I739 Peripheral vascular disease, unspecified: Secondary | ICD-10-CM | POA: Diagnosis present

## 2016-08-23 DIAGNOSIS — I679 Cerebrovascular disease, unspecified: Secondary | ICD-10-CM | POA: Diagnosis present

## 2016-08-23 DIAGNOSIS — I130 Hypertensive heart and chronic kidney disease with heart failure and stage 1 through stage 4 chronic kidney disease, or unspecified chronic kidney disease: Secondary | ICD-10-CM | POA: Diagnosis present

## 2016-08-23 DIAGNOSIS — R1310 Dysphagia, unspecified: Secondary | ICD-10-CM | POA: Diagnosis present

## 2016-08-23 DIAGNOSIS — N138 Other obstructive and reflux uropathy: Secondary | ICD-10-CM | POA: Diagnosis not present

## 2016-08-23 DIAGNOSIS — Z936 Other artificial openings of urinary tract status: Secondary | ICD-10-CM

## 2016-08-23 DIAGNOSIS — J8410 Pulmonary fibrosis, unspecified: Secondary | ICD-10-CM | POA: Diagnosis present

## 2016-08-23 DIAGNOSIS — Z9359 Other cystostomy status: Secondary | ICD-10-CM

## 2016-08-23 DIAGNOSIS — K8051 Calculus of bile duct without cholangitis or cholecystitis with obstruction: Secondary | ICD-10-CM | POA: Diagnosis not present

## 2016-08-23 DIAGNOSIS — N1330 Unspecified hydronephrosis: Secondary | ICD-10-CM | POA: Diagnosis present

## 2016-08-23 DIAGNOSIS — N183 Chronic kidney disease, stage 3 unspecified: Secondary | ICD-10-CM | POA: Diagnosis present

## 2016-08-23 DIAGNOSIS — N179 Acute kidney failure, unspecified: Secondary | ICD-10-CM | POA: Diagnosis not present

## 2016-08-23 DIAGNOSIS — N200 Calculus of kidney: Secondary | ICD-10-CM

## 2016-08-23 DIAGNOSIS — Z881 Allergy status to other antibiotic agents status: Secondary | ICD-10-CM

## 2016-08-23 DIAGNOSIS — G35 Multiple sclerosis: Secondary | ICD-10-CM | POA: Diagnosis present

## 2016-08-23 DIAGNOSIS — I5032 Chronic diastolic (congestive) heart failure: Secondary | ICD-10-CM | POA: Diagnosis present

## 2016-08-23 DIAGNOSIS — N132 Hydronephrosis with renal and ureteral calculous obstruction: Secondary | ICD-10-CM | POA: Diagnosis not present

## 2016-08-23 DIAGNOSIS — N390 Urinary tract infection, site not specified: Secondary | ICD-10-CM | POA: Diagnosis not present

## 2016-08-23 DIAGNOSIS — I10 Essential (primary) hypertension: Secondary | ICD-10-CM | POA: Diagnosis present

## 2016-08-23 DIAGNOSIS — E860 Dehydration: Secondary | ICD-10-CM | POA: Diagnosis not present

## 2016-08-23 DIAGNOSIS — E039 Hypothyroidism, unspecified: Secondary | ICD-10-CM | POA: Diagnosis present

## 2016-08-23 DIAGNOSIS — I471 Supraventricular tachycardia: Secondary | ICD-10-CM | POA: Diagnosis present

## 2016-08-23 DIAGNOSIS — E876 Hypokalemia: Secondary | ICD-10-CM | POA: Diagnosis present

## 2016-08-23 DIAGNOSIS — N319 Neuromuscular dysfunction of bladder, unspecified: Secondary | ICD-10-CM | POA: Diagnosis present

## 2016-08-23 DIAGNOSIS — R109 Unspecified abdominal pain: Secondary | ICD-10-CM

## 2016-08-23 DIAGNOSIS — N136 Pyonephrosis: Secondary | ICD-10-CM | POA: Diagnosis not present

## 2016-08-23 DIAGNOSIS — Z86010 Personal history of colonic polyps: Secondary | ICD-10-CM

## 2016-08-23 DIAGNOSIS — E870 Hyperosmolality and hypernatremia: Secondary | ICD-10-CM | POA: Diagnosis not present

## 2016-08-23 DIAGNOSIS — Z820 Family history of epilepsy and other diseases of the nervous system: Secondary | ICD-10-CM

## 2016-08-23 DIAGNOSIS — E785 Hyperlipidemia, unspecified: Secondary | ICD-10-CM | POA: Diagnosis not present

## 2016-08-23 DIAGNOSIS — Z823 Family history of stroke: Secondary | ICD-10-CM

## 2016-08-23 DIAGNOSIS — G4733 Obstructive sleep apnea (adult) (pediatric): Secondary | ICD-10-CM | POA: Diagnosis present

## 2016-08-23 DIAGNOSIS — M5489 Other dorsalgia: Secondary | ICD-10-CM | POA: Diagnosis not present

## 2016-08-23 DIAGNOSIS — Z8673 Personal history of transient ischemic attack (TIA), and cerebral infarction without residual deficits: Secondary | ICD-10-CM

## 2016-08-23 DIAGNOSIS — I5022 Chronic systolic (congestive) heart failure: Secondary | ICD-10-CM | POA: Diagnosis present

## 2016-08-23 DIAGNOSIS — Z87891 Personal history of nicotine dependence: Secondary | ICD-10-CM

## 2016-08-23 DIAGNOSIS — N1339 Other hydronephrosis: Secondary | ICD-10-CM | POA: Diagnosis not present

## 2016-08-23 DIAGNOSIS — Z87442 Personal history of urinary calculi: Secondary | ICD-10-CM

## 2016-08-23 DIAGNOSIS — Z79899 Other long term (current) drug therapy: Secondary | ICD-10-CM

## 2016-08-23 DIAGNOSIS — R52 Pain, unspecified: Secondary | ICD-10-CM | POA: Diagnosis not present

## 2016-08-23 DIAGNOSIS — Z8249 Family history of ischemic heart disease and other diseases of the circulatory system: Secondary | ICD-10-CM

## 2016-08-23 LAB — URINALYSIS, ROUTINE W REFLEX MICROSCOPIC
BILIRUBIN URINE: NEGATIVE
Glucose, UA: NEGATIVE mg/dL
Ketones, ur: NEGATIVE mg/dL
Nitrite: POSITIVE — AB
PH: 7.5 (ref 5.0–8.0)
SPECIFIC GRAVITY, URINE: 1.01 (ref 1.005–1.030)

## 2016-08-23 LAB — BASIC METABOLIC PANEL
Anion gap: 8 (ref 5–15)
BUN: 40 mg/dL — AB (ref 6–20)
CO2: 25 mmol/L (ref 22–32)
Calcium: 9.5 mg/dL (ref 8.9–10.3)
Chloride: 108 mmol/L (ref 101–111)
Creatinine, Ser: 1.69 mg/dL — ABNORMAL HIGH (ref 0.61–1.24)
GFR calc Af Amer: 45 mL/min — ABNORMAL LOW (ref >60)
GFR, EST NON AFRICAN AMERICAN: 38 mL/min — AB (ref >60)
GLUCOSE: 107 mg/dL — AB (ref 65–99)
POTASSIUM: 3.8 mmol/L (ref 3.5–5.1)
Sodium: 141 mmol/L (ref 135–145)

## 2016-08-23 LAB — CBC WITH DIFFERENTIAL/PLATELET
Basophils Absolute: 0 10*3/uL (ref 0.0–0.1)
Basophils Relative: 0 %
EOS ABS: 0.5 10*3/uL (ref 0.0–0.7)
Eosinophils Relative: 4 %
HEMATOCRIT: 44.2 % (ref 39.0–52.0)
HEMOGLOBIN: 14.6 g/dL (ref 13.0–17.0)
LYMPHS ABS: 1.1 10*3/uL (ref 0.7–4.0)
Lymphocytes Relative: 10 %
MCH: 30.8 pg (ref 26.0–34.0)
MCHC: 33 g/dL (ref 30.0–36.0)
MCV: 93.2 fL (ref 78.0–100.0)
MONOS PCT: 4 %
Monocytes Absolute: 0.4 10*3/uL (ref 0.1–1.0)
NEUTROS ABS: 9.1 10*3/uL — AB (ref 1.7–7.7)
NEUTROS PCT: 82 %
Platelets: 143 10*3/uL — ABNORMAL LOW (ref 150–400)
RBC: 4.74 MIL/uL (ref 4.22–5.81)
RDW: 13.4 % (ref 11.5–15.5)
WBC: 11.2 10*3/uL — ABNORMAL HIGH (ref 4.0–10.5)

## 2016-08-23 LAB — LACTIC ACID, PLASMA
Lactic Acid, Venous: 1.1 mmol/L (ref 0.5–1.9)
Lactic Acid, Venous: 0.8 mmol/L (ref 0.5–1.9)

## 2016-08-23 LAB — URINE MICROSCOPIC-ADD ON: SQUAMOUS EPITHELIAL / LPF: NONE SEEN

## 2016-08-23 MED ORDER — SODIUM CHLORIDE 0.9% FLUSH
3.0000 mL | Freq: Two times a day (BID) | INTRAVENOUS | Status: DC
  Administered 2016-08-23 – 2016-08-26 (×4): 3 mL via INTRAVENOUS

## 2016-08-23 MED ORDER — SODIUM CHLORIDE 0.9 % IV SOLN
INTRAVENOUS | Status: DC
  Administered 2016-08-23: 11:00:00 via INTRAVENOUS

## 2016-08-23 MED ORDER — ONDANSETRON HCL 4 MG PO TABS: 4.0000 mg | ORAL_TABLET | Freq: Four times a day (QID) | ORAL | Status: DC | PRN

## 2016-08-23 MED ORDER — VERAPAMIL HCL 40 MG PO TABS
40.0000 mg | ORAL_TABLET | Freq: Two times a day (BID) | ORAL | Status: DC
  Administered 2016-08-23 – 2016-08-27 (×9): 40 mg via ORAL
  Filled 2016-08-23 (×9): qty 1

## 2016-08-23 MED ORDER — DARIFENACIN HYDROBROMIDE ER 15 MG PO TB24
15.0000 mg | ORAL_TABLET | Freq: Every day | ORAL | Status: DC
  Administered 2016-08-23 – 2016-08-27 (×5): 15 mg via ORAL
  Filled 2016-08-23 (×5): qty 1

## 2016-08-23 MED ORDER — ACETAMINOPHEN 325 MG PO TABS
650.0000 mg | ORAL_TABLET | Freq: Four times a day (QID) | ORAL | Status: DC | PRN
  Administered 2016-08-25: 650 mg via ORAL
  Filled 2016-08-23: qty 2

## 2016-08-23 MED ORDER — SODIUM CHLORIDE 0.9 % IV SOLN: INTRAVENOUS | Status: AC

## 2016-08-23 MED ORDER — ONDANSETRON HCL 4 MG/2ML IJ SOLN: 4.0000 mg | Freq: Four times a day (QID) | INTRAMUSCULAR | Status: DC | PRN

## 2016-08-23 MED ORDER — LEVOTHYROXINE SODIUM 88 MCG PO TABS
88.0000 ug | ORAL_TABLET | ORAL | Status: AC
  Administered 2016-08-23: 88 ug via ORAL
  Filled 2016-08-23: qty 1

## 2016-08-23 MED ORDER — HYDROCODONE-ACETAMINOPHEN 7.5-325 MG PO TABS
1.0000 | ORAL_TABLET | Freq: Two times a day (BID) | ORAL | Status: DC
  Administered 2016-08-23 – 2016-08-27 (×9): 1 via ORAL
  Filled 2016-08-23 (×9): qty 1

## 2016-08-23 MED ORDER — SODIUM CHLORIDE 0.9% FLUSH: 3.0000 mL | INTRAVENOUS | Status: DC | PRN

## 2016-08-23 MED ORDER — CEFTRIAXONE SODIUM 1 G IJ SOLR
1.0000 g | INTRAMUSCULAR | Status: DC
  Administered 2016-08-24 – 2016-08-26 (×3): 1 g via INTRAVENOUS
  Filled 2016-08-23 (×4): qty 10

## 2016-08-23 MED ORDER — LEVOTHYROXINE SODIUM 88 MCG PO TABS
88.0000 ug | ORAL_TABLET | Freq: Every day | ORAL | Status: DC
  Administered 2016-08-24 – 2016-08-27 (×4): 88 ug via ORAL
  Filled 2016-08-23 (×4): qty 1

## 2016-08-23 MED ORDER — MORPHINE SULFATE (PF) 4 MG/ML IV SOLN
4.0000 mg | INTRAVENOUS | Status: AC | PRN
  Administered 2016-08-23 (×2): 4 mg via INTRAVENOUS
  Filled 2016-08-23 (×2): qty 1

## 2016-08-23 MED ORDER — CEFTRIAXONE SODIUM 1 G IJ SOLR
1.0000 g | Freq: Once | INTRAMUSCULAR | Status: AC
  Administered 2016-08-23: 1 g via INTRAVENOUS
  Filled 2016-08-23: qty 10

## 2016-08-23 MED ORDER — SODIUM CHLORIDE 0.9 % IV SOLN: 250.0000 mL | INTRAVENOUS | Status: DC | PRN

## 2016-08-23 MED ORDER — ONDANSETRON HCL 4 MG/2ML IJ SOLN
4.0000 mg | INTRAMUSCULAR | Status: DC | PRN
  Administered 2016-08-23: 4 mg via INTRAVENOUS
  Filled 2016-08-23: qty 2

## 2016-08-23 MED ORDER — CARVEDILOL 6.25 MG PO TABS
6.2500 mg | ORAL_TABLET | Freq: Two times a day (BID) | ORAL | Status: DC
  Administered 2016-08-23 – 2016-08-27 (×8): 6.25 mg via ORAL
  Filled 2016-08-23 (×8): qty 1

## 2016-08-23 MED ORDER — ACETAMINOPHEN 650 MG RE SUPP: 650.0000 mg | Freq: Four times a day (QID) | RECTAL | Status: DC | PRN

## 2016-08-23 MED ORDER — DIAZEPAM 5 MG PO TABS
5.0000 mg | ORAL_TABLET | Freq: Two times a day (BID) | ORAL | Status: DC
  Administered 2016-08-23 – 2016-08-27 (×9): 5 mg via ORAL
  Filled 2016-08-23 (×9): qty 1

## 2016-08-23 MED ORDER — MORPHINE SULFATE (PF) 4 MG/ML IV SOLN
4.0000 mg | INTRAVENOUS | Status: DC | PRN
  Administered 2016-08-23: 4 mg via INTRAVENOUS
  Filled 2016-08-23: qty 1

## 2016-08-23 NOTE — ED Notes (Signed)
Arville Go (pt's wife) requests to be notified when there is in an update on pt's disposition [phone].

## 2016-08-23 NOTE — ED Notes (Signed)
Called [facility] with bed assignment at [facility] and for a truck to transport.

## 2016-08-23 NOTE — ED Triage Notes (Signed)
Pt states left flank pain that stated this morning around 0400. Pt has suprapubic catheter, has noticed some blood in bag.

## 2016-08-23 NOTE — H&P (Signed)
History and Physical  HIGINIO GROW VCB:[id number] DOB: 1941/09/24 DOA: 08/23/2016  PCP: [name], MD  Uro: Mariane Baumgarten Patient coming from: home  Chief Complaint: Left flank pain  HPI:  75 year old man with history of nephrolithiasis presented with acute onset of left flank pain and mild hematuria. Imaging revealed severe left hydronephrosis. EDP discussed with urology who recommended transfer to [facility] for further evaluation and possible percutaneous nephrostomy placement.  Overall the patient is doing fairly well as of late. This morning he had sudden onset of left flank pain which was severe in nature without specific aggravating or alleviating factors. Morphine has not helped. He saw small amount of blood in catheter tubing. His catheter has been emptying normally. No abdominal pain.  ED Course: Afebrile, vital signs stable, treated with morphine, Zofran, ceftriaxone.  Pertinent labs: WBC 11.2, creatinine at baseline 1.69, potassium normal, lactic acid within normal limits. Urinalysis too numerous to count WBC, RBC, large leukocyte esterase, positive nitrate. Urine culture pending. Imaging: CT abdomen and pelvis showed severe left renal obstruction, severe left hydronephrosis. Cystoscopy recommended. Chronic bulky choledocholithiasis with distal CBD obstruction moderate to severe intra-and extrahepatic biliary ductal enlargement  Review of Systems:  Negative for fever, new visual changes, rash, chest pain, SOB, bladder spasms,  n/v/abdominal pain.  Positive for sore throat, redness of right eye  Past Medical History:  Diagnosis Date  . Anemia   . Arthritis   . Back pain, chronic   . Bilateral carotid bruits   . C. difficile colitis 09/2011  . CAD (coronary artery disease)   . Carotid artery stenosis   . Cerebrovascular disease   . Colon polyps   . Dyslipidemia   . Dysphagia 10/07/2011  . Encephalopathy   . Gait disorder   . HA (headache)   . High grade  dysplasia in colonic adenoma 09/2005  . HTN (hypertension), malignant 10/06/2011  . Hypernatremia   . Hypokalemia   . Hypothyroidism 10/08/2011  . Insomnia   . Junctional rhythm   . Kidney stones   . MS (multiple sclerosis) ([place])   . Neuromuscular disorder ([place])   . OSA (obstructive sleep apnea)   . Paroxysmal atrial tachycardia ([place])   . Peripheral vascular disease ([place])   . Pneumonia 4 yrs ago  . Pulmonary fibrosis ([place]) 10/06/2011  . Pulmonary nodule 10/08/2011  . PVD (peripheral vascular disease) ([place])   . Sacral ulcer ([place])   . Sleep apnea   . TIA (transient ischemic attack)   . Tremors of nervous system 10/08/2011  . Tremors of nervous system   . Urinary tract infection     Past Surgical History:  Procedure Laterality Date  . APPENDECTOMY  09/2005   at time of left hemicolectomy  . BACK SURGERY  1976/1979   lower  . CHOLECYSTECTOMY     Dr. Tamala Julian  . COLON SURGERY  09/2005   F[place]man: four tubular adenomas, large adenomatous polyp with HIGH GRADE dysplasia  . COLONOSCOPY  11/2004   Dr. Sharol Roussel sessile polyp splenic flexure, 61mm sessile polyp desc colon, tubulovillous adenoma (bx not removed)  . COLONOSCOPY  01/2005   poor prep, polyp could not be found  . COLONOSCOPY  05/2005   with EMR, polypectomy Dr. Olegario Messier, bx showed high grade dysplasia, partially resected  . COLONOSCOPY  09/2005   Dr. Arsenio Loader, [place] ink tattooing, four villous colon polyp (3 had been missed on previous colonoscopies due to limitations of procedures  . COLONOSCOPY  09/2006   normal TI,  no polyps  . COLONOSCOPY  10/2007   Dr. Imogene Burn distal mammillations, benign bx, normal TI, random bx neg for microscopic colitis  . INGUINAL HERNIA REPAIR  1971   bilateral  . KIDNEY STONE SURGERY  09/13/2015  . NEPHROLITHOTOMY Left 09/13/2015   Procedure: LEFT PERCUTANEOUS NEPHROLITHOTOMY ;  Surgeon: Franchot Gallo, MD;  Location: [facility];  Service: Urology;  Laterality: Left;  . SUPRAPUBIC CATHETER  INSERTION       reports that he has quit smoking. His smoking use included Cigarettes. He has never used smokeless tobacco. He reports that he does not drink alcohol or use drugs.   Allergies  Allergen Reactions  . Tetracyclines & Related Anaphylaxis and Rash  . Ciprofloxacin     Trouble swallowing unknown reaction according to wife     Family History  Problem Relation Age of Onset  . Cirrhosis Brother     etoh  . Stroke Mother 54  . Coronary artery disease Father 56  . Heart attack Brother   . Cancer Sister   . Multiple sclerosis    . Colon cancer Neg Hx      Prior to Admission medications   Medication Sig Start Date End Date Taking? Authorizing Provider  carvedilol (COREG) 6.25 MG tablet Take 1 tablet (6.25 mg total) by mouth 2 (two) times daily with a meal. 07/26/14  Yes Verlee Monte, MD  diazepam (VALIUM) 5 MG tablet Take 1 tablet (5 mg total) by mouth 2 (two) times daily. 08/19/16  Yes Ward Givens, NP  HYDROcodone-acetaminophen (NORCO) 7.5-325 MG per tablet Take 1 tablet by mouth 2 (two) times daily. Max APAP 3 GM IN 24 HOURS FROM ALL SOURCES Patient taking differently: Take 1 tablet by mouth every 6 (six) hours as needed for moderate pain. Max APAP 3 GM IN 24 HOURS FROM ALL SOURCES 08/22/14  Yes Mahima Pandey, MD  levothyroxine (SYNTHROID, LEVOTHROID) 88 MCG tablet Take 88 mcg by mouth daily before breakfast.   Yes Historical Provider, MD  potassium chloride SA (K-DUR,KLOR-CON) 20 MEQ tablet Take 20 mEq by mouth daily.   Yes Rexene Alberts, MD  solifenacin (VESICARE) 10 MG tablet Take 10 mg by mouth daily as needed.   Yes Historical Provider, MD  verapamil (CALAN) 40 MG tablet Take 40 mg by mouth 2 (two) times daily.   Yes Historical Provider, MD    Physical Exam: Vitals:   08/23/16 0610 08/23/16 0613 08/23/16 0848 08/23/16 1031  BP:  155/94 134/77 129/86  Pulse:  67 84 78  Resp:  16 18 14   Temp:  98 F (36.7 C)  99.3 F (37.4 C)  TempSrc:  Oral  Oral  SpO2:   100% 99% 100%  Weight: 60.8 kg (134 lb)     Height: 5\' 6"  (1.676 m)      Constitutional:  . Appears calm and comfortable Eyes:  . PERRL and irises appear normal. Conjunctiva on the right 1+ injected laterally. . Normal conjunctivae and lids Wears glasses ENMT:  . grossly normal hearing . Lips appear normal Neck:  . neck appears normal, no masses . no thyromegaly Respiratory:  . CTA bilaterally, no w/r/r.  . Respiratory effort normal. No retractions or accessory muscle use Cardiovascular:  . RRR, no m/r/g . No LE extremity edema   Abdomen:  . Abdomen appears normal; no tenderness or masses . No hernias noted Suprapubic catheter site appears unremarkable Musculoskeletal:  . Digits/nails bilateral hands: no clubbing, cyanosis, petechiae, infection . RUE, LUE, RLE, LLE   .  Grossly unremarkable SKin: No lesions noted. Nontender.  Psychiatric:  . judgement and insight appear normal . Mental status o Mood, affect appropriate  Wt Readings from Last 3 Encounters:  08/23/16 60.8 kg (134 lb)  08/19/16 60.8 kg (134 lb)  09/13/15 58.2 kg (128 lb 4.9 oz)    I have personally reviewed following labs and imaging studies  Labs on Admission:  CBC:  Recent Labs Lab 08/23/16 0805  WBC 11.2*  NEUTROABS 9.1*  HGB 14.6  HCT 44.2  MCV 93.2  PLT 606*   Basic Metabolic Panel:  Recent Labs Lab 08/23/16 0805  NA 141  K 3.8  CL 108  CO2 25  GLUCOSE 107*  BUN 40*  CREATININE 1.69*  CALCIUM 9.5   Urine analysis:    Component Value Date/Time   COLORURINE YELLOW 08/23/2016 [place] 08/23/2016 0613   LABSPEC 1.010 08/23/2016 0613   PHURINE 7.5 08/23/2016 0613   GLUCOSEU NEGATIVE 08/23/2016 0613   HGBUR LARGE (A) 08/23/2016 0613   BILIRUBINUR NEGATIVE 08/23/2016 0613   KETONESUR NEGATIVE 08/23/2016 0613   PROTEINUR TRACE (A) 08/23/2016 0613   UROBILINOGEN 0.2 07/17/2014 1215   NITRITE POSITIVE (A) 08/23/2016 0613   LEUKOCYTESUR LARGE (A) 08/23/2016  0613   Radiological Exams on Admission: Ct Renal Stone Study  Result Date: 08/23/2016 CLINICAL DATA:  75 year old male with left flank pain since 0400 hours. In dwelling suprapubic catheter with new gross hematuria in catheter bag. Initial encounter. EXAM: CT ABDOMEN AND PELVIS WITHOUT CONTRAST TECHNIQUE: Multidetector CT imaging of the abdomen and pelvis was performed following the standard protocol without IV contrast. COMPARISON:  CT Abdomen and Pelvis 08/04/2015. FINDINGS: Lower chest: Resolved left pleural effusion since 2016. No pericardial effusion. Centrilobular atelectasis and mild bibasilar scarring. No upper abdominal free air. Hepatobiliary: Surgically absent gallbladder. Chronic choledocholithiasis with stable to mildly progressed intra and extrahepatic biliary ductal dilatation. An oblong 18 mm calculus remains present in the distal CBD (coronal images 35 and 36). The CBD is not dilated to 15 mm diameter versus 14 mm in 2016. Pancreas: Negative aside from the distal CBD obstruction. Spleen: Negative. Adrenals/Urinary Tract: Negative adrenal glands. Chronic right renal atrophy with right renal artery calcifications and a 6 mm nonobstructing right lower pole calculus. No right hydronephrosis or hydroureter. Severely enlarged and obstructed left kidney with confluent left para renal space stranding and/or fluid. The left ureter is irregular and dilated to the ureterovesical junction. There is evidence of left ureter urothelial thickening and an irregular contour of the proximal third of the left ureter, but no discrete mass or calculus within the left ureter. Multiple intrarenal calculi in measuring up to 5 to 7 mm are noted. Superimposed left renal artery vascular calcifications. Compared to 2016 the left hydronephrosis is stable. Superimposed exophytic upper pole renal cysts with simple fluid densitometry again noted. There is a suprapubic catheter in place, the urinary bladder is decompressed but  thick walled and mildly irregular appearing. Increased calcifications within the bladder including a 2-3 mm central posterior bladder calcification and larger 14 mm irregular calcification near the right UVJ. Stomach/Bowel: Retained stool in the colon. Sequelae of left hemicolectomy with transverse to Sigmoid colon anastomosis with no adverse features. The cecum is located in the right hemipelvis. The appendix is surgically absent. There is flocculated material in the small bowel. No dilated small bowel. Chronic rectus muscle diastases with broad-based ventral abdominal protrusion of bowel and mesentery, not a bona fide bowel hernia. Duodenum within normal limits. Vascular/Lymphatic:  Extensive Aortoiliac calcified atherosclerosis noted. Chronic severe stenosis at the aortoiliac bifurcation with highly diminutive appearance of the common and distal bilateral iliac arteries. No lymphadenopathy. Reproductive: Negative. Other: No pelvic free fluid. Musculoskeletal: Osteopenia. No acute osseous abnormality identified. IMPRESSION: 1. Severe left renal obstruction very similar to that on the 2016 CT Abdomen and Pelvis. Severe left hydronephrosis and para-renal stranding. Thickened and irregular left ureter, especially the proximal third, with no ureteral calculus or discrete obstructing lesion. Multiple nonobstructing left intrarenal calculi. Superimposed chronic right renal atrophy and nephrolithiasis. Suprapubic catheter in place with thickened but diminutive urinary bladder. Increased calcifications within the bladder compared to 2016, although none in proximity of the left UVJ. Overall, Cystoscopy and retrograde study of the left ureter may be most valuable at this point. 2. Chronic bulky 18 mm choledocholithiasis with distal CBD obstruction. Moderate to severe intra- and extrahepatic biliary ductal enlargement has mildly progressed. 3. Chronic severe aortic and iliac calcified atherosclerosis with suspected high-grade  chronic aortoiliac bifurcation and common iliac artery stenosis. 4. Small left pleural effusion has resolved since 2016. Electronically Signed   By: Genevie Ann M.D.   On: 08/23/2016 08:24    EKG: Independently reviewed. NSR, no acute changes  Principal Problem:   Hydronephrosis with obstructing calculus Active Problems:   Lower urinary tract infectious disease   Chronic suprapubic catheter (HCC)   Chronic systolic congestive heart failure (HCC)   Kidney stones   CKD (chronic kidney disease), stage III   Hydronephrosis of left kidney   Assessment/Plan 1. Severe left renal obstruction, severe left hydronephrosis. No discrete calculus noted. Considering urinalysis with hematuria and WBCs, suspect pyelonephritis. 2. Pyelonephritis. 3. Chronic bulky choledocholithiasis with distal CBD obstruction. Moderate to severe intra-and extrahepatic biliary ductal enlargement mildly progressed. Compared to previous studies this is a chronic finding. Abdomen soft, nondistended. No right upper quadrant pain. 4. Chronic kidney disease stage III, creatinine at baseline. BUN 40.  5. Cerebrovascular disease, question of MS in past, chronic gait disorder, dysphagia, neurogenic bladder, chronic indwelling catheter. Followed by neurology, seen 11/21, noted to be stable, rec f/u 1 year. 6. PMH: Paroxysmal atrial tachycardia, chronic systolic heart failure, pulmonary fibrosis. These issues appear stable. 7. Chronic severe aortic and iliac calcified atherosclerosis is suspected high-grade chronic aortoiliac bifurcation and common iliac artery stenosis. This is a chronic finding seen on previous CT November 2016. This appears to be asymptomatic   Appears medically stable for admission to medical floor  EDP discussed with urology Dr. [name], recommended antibiotics, transfer to [facility] long.he will see the patient consultation. Patient may need percutaneous nephrostomy.  Accepting MD Dr. Wendee Beavers at [facility].  Empiric  antibiotics, follow-up culture data  SCDs Code Status: full code Family Communication: none   Consults called: urology    It is my clinical opinion that referral for OBSERVATION is reasonable and necessary in this patient  . presenting with symptoms of left flank pain with blood in urine, concerning for pyelonephritis, nephrolithiasis . in the context of PMH including nephrolithiasis . with pertinent physical exam findings of left flank pain . and pertinent radiographic and laboratory data including positive urinalysis, WBC 11.2 .  The aforementioned taken together are felt to place the patient at high risk for further clinical deterioration. However it is anticipated that the patient may be medically stable for discharge from the hospital within 24 to 48 hours.   Time spent: 50 minutes  Murray Hodgkins, MD  [facility] Direct contact: [phone] --[address] [place]  --[email or web address];  password [email or web address]  7PM-7AM contact night coverage as above  08/23/2016, 12:32 PM

## 2016-08-23 NOTE — ED Notes (Signed)
Pt stable and ready for transport to [facility] [id number].  Report given to Maretta Bees, RN.

## 2016-08-23 NOTE — Progress Notes (Signed)
Pt requesting a dose of his thyroid med and something to eat. Dr. Sarajane Jews notified. Said it's OK for pt to have a dose of his thyroid med and to put pt on a regular diet for now. Pharmacy made aware for drug and order placed for regular diet.

## 2016-08-23 NOTE — Progress Notes (Signed)
Pt received in room 1422. Alert and oriented. Assessment completed. Pt in no acute distress at the moment. Awaiting Dr. Alan Ripper arrival.

## 2016-08-23 NOTE — ED Provider Notes (Signed)
[facility] DEPT Provider Note   CSN: [id number] Arrival date & time: 08/23/16  0607     History   Chief Complaint Chief Complaint  Patient presents with  . Flank Pain    HPI Jordan Ward is a 75 y.o. male.  HPI  Pt was seen at [facility].  Per pt, c/o sudden onset and persistence of constant left sided flank "pain" that began this morning PTA.  Pt describes the pain as "like my last kidney stone." Has been associated with "seeing blood in my catheter bag." States his suprapubic cath was changed approximately 2 weeks ago by his Uro MD.  Denies testicular pain/swelling, no abd pain, no N/V, no diarrhea, no CP/SOB, no fevers, no rash.    Uro: Mariane Baumgarten Past Medical History:  Diagnosis Date  . Anemia   . Arthritis   . Back pain, chronic   . Bilateral carotid bruits   . C. difficile colitis 09/2011  . CAD (coronary artery disease)   . Carotid artery stenosis   . Cerebrovascular disease   . Colon polyps   . Dyslipidemia   . Dysphagia 10/07/2011  . Encephalopathy   . Gait disorder   . HA (headache)   . High grade dysplasia in colonic adenoma 09/2005  . HTN (hypertension), malignant 10/06/2011  . Hypernatremia   . Hypokalemia   . Hypothyroidism 10/08/2011  . Insomnia   . Junctional rhythm   . Kidney stones   . MS (multiple sclerosis) ([place])   . Neuromuscular disorder ([place])   . OSA (obstructive sleep apnea)   . Paroxysmal atrial tachycardia ([place])   . Peripheral vascular disease ([place])   . Pneumonia 4 yrs ago  . Pulmonary fibrosis ([place]) 10/06/2011  . Pulmonary nodule 10/08/2011  . PVD (peripheral vascular disease) ([place])   . Sacral ulcer ([place])   . Sleep apnea   . TIA (transient ischemic attack)   . Tremors of nervous system 10/08/2011  . Tremors of nervous system   . Urinary tract infection     Patient Active Problem List   Diagnosis Date Noted  . Kidney stones 09/13/2015  . AKI (acute kidney injury) ([place]) 08/08/2015  . Acute hemodialysis patient ([place])   . Essential  hypertension   . Pressure ulcer 08/04/2015  . Acute renal failure superimposed on stage 3 chronic kidney disease ([place]) 08/04/2015  . Chronic systolic congestive heart failure ([place]) 08/04/2015  . Hydronephrosis with obstructing calculus 08/04/2015  . Occult blood positive stool 09/05/2014  . Rash and nonspecific skin eruption 08/25/2014  . Edema 08/19/2014  . Acute renal insufficiency 08/08/2014  . Subacute confusional state 08/06/2014  . Dilated cardiomyopathy ([place]) 07/21/2014  . Sepsis ([place]) 07/17/2014  . Severe sepsis ([place]) 07/17/2014  . Headache 03/13/2014  . CVA (cerebral infarction) 03/13/2014  . Abnormality of gait 03/13/2014  . Tremors of nervous system 10/08/2011  . Hypothyroidism 10/08/2011  . Pulmonary nodule 10/08/2011  . Dysphagia 10/07/2011  . Hypernatremia 10/07/2011  . HTN (hypertension), malignant 10/06/2011  . Chronic indwelling Foley catheter 10/06/2011  . Pulmonary fibrosis ([place]) 10/06/2011  . UTI (lower urinary tract infection) 10/03/2011  . C. difficile diarrhea 10/02/2011  . Hyperkalemia 10/02/2011  . Dehydration 10/02/2011  . PNA (pneumonia) probable 10/02/2011  . Junctional rhythm 10/02/2011  . Multiple sclerosis ([place]) 10/02/2011  . Anemia 06/18/2011  . Chronic diarrhea 06/17/2011  . Hx of adenomatous colonic polyps 06/17/2011  . High grade dysplasia in colonic adenoma 09/29/2005    Past Surgical History:  Procedure Laterality  Date  . APPENDECTOMY  09/2005   at time of left hemicolectomy  . BACK SURGERY  1976/1979   lower  . CHOLECYSTECTOMY     Dr. Tamala Julian  . COLON SURGERY  09/2005   [place]: four tubular adenomas, large adenomatous polyp with HIGH GRADE dysplasia  . COLONOSCOPY  11/2004   Dr. Sharol Roussel sessile polyp splenic flexure, 56mm sessile polyp desc colon, tubulovillous adenoma (bx not removed)  . COLONOSCOPY  01/2005   poor prep, polyp could not be found  . COLONOSCOPY  05/2005   with EMR, polypectomy Dr. Olegario Messier, bx showed  high grade dysplasia, partially resected  . COLONOSCOPY  09/2005   Dr. Arsenio Loader, [place] ink tattooing, four villous colon polyp (3 had been missed on previous colonoscopies due to limitations of procedures  . COLONOSCOPY  09/2006   normal TI, no polyps  . COLONOSCOPY  10/2007   Dr. Imogene Burn distal mammillations, benign bx, normal TI, random bx neg for microscopic colitis  . INGUINAL HERNIA REPAIR  1971   bilateral  . KIDNEY STONE SURGERY  09/13/2015  . NEPHROLITHOTOMY Left 09/13/2015   Procedure: LEFT PERCUTANEOUS NEPHROLITHOTOMY ;  Surgeon: Franchot Gallo, MD;  Location: [facility];  Service: Urology;  Laterality: Left;  . SUPRAPUBIC CATHETER INSERTION         Home Medications    Prior to Admission medications   Medication Sig Start Date End Date Taking? Authorizing Provider  carvedilol (COREG) 6.25 MG tablet Take 1 tablet (6.25 mg total) by mouth 2 (two) times daily with a meal. 07/26/14  Yes Verlee Monte, MD  diazepam (VALIUM) 5 MG tablet Take 1 tablet (5 mg total) by mouth 2 (two) times daily. 08/19/16  Yes Ward Givens, NP  HYDROcodone-acetaminophen (NORCO) 7.5-325 MG per tablet Take 1 tablet by mouth 2 (two) times daily. Max APAP 3 GM IN 24 HOURS FROM ALL SOURCES Patient taking differently: Take 1 tablet by mouth every 6 (six) hours as needed for moderate pain. Max APAP 3 GM IN 24 HOURS FROM ALL SOURCES 08/22/14  Yes Mahima Pandey, MD  levothyroxine (SYNTHROID, LEVOTHROID) 88 MCG tablet Take 88 mcg by mouth daily before breakfast.   Yes Historical Provider, MD  potassium chloride SA (K-DUR,KLOR-CON) 20 MEQ tablet Take 20 mEq by mouth daily.   Yes Rexene Alberts, MD  sulfamethoxazole-trimethoprim (BACTRIM,SEPTRA) 400-80 MG tablet Take 1 tablet by mouth 2 (two) times daily. 09/14/15  Yes Franchot Gallo, MD  verapamil (CALAN) 40 MG tablet Take 40 mg by mouth 2 (two) times daily.   Yes Historical Provider, MD    Family History Family History  Problem Relation Age of Onset  .  Cirrhosis Brother     etoh  . Stroke Mother 71  . Coronary artery disease Father 33  . Heart attack Brother   . Cancer Sister   . Multiple sclerosis    . Colon cancer Neg Hx     Social History Social History  Substance Use Topics  . Smoking status: Former Smoker    Types: Cigarettes  . Smokeless tobacco: Never Used     Comment: quit remote  . Alcohol use No     Allergies   Tetracyclines & related and Ciprofloxacin   Review of Systems Review of Systems ROS: Statement: All systems negative except as marked or noted in the HPI; Constitutional: Negative for fever and chills. ; ; Eyes: Negative for eye pain, redness and discharge. ; ; ENMT: Negative for ear pain, hoarseness, nasal congestion, sinus pressure and sore  throat. ; ; Cardiovascular: Negative for chest pain, palpitations, diaphoresis, dyspnea and peripheral edema. ; ; Respiratory: Negative for cough, wheezing and stridor. ; ; Gastrointestinal: Negative for nausea, vomiting, diarrhea, abdominal pain, blood in stool, hematemesis, jaundice and rectal bleeding. . ; ; Genitourinary: Negative for dysuria. +flank pain and hematuria. ; ; Genital:  No penile drainage or rash, no testicular pain or swelling, no scrotal rash or swelling. ;; Musculoskeletal: Negative for back pain and neck pain. Negative for swelling and trauma.; ; Skin: Negative for pruritus, rash, abrasions, blisters, bruising and skin lesion.; ; Neuro: Negative for headache, lightheadedness and neck stiffness. Negative for weakness, altered level of consciousness, altered mental status, extremity weakness, paresthesias, involuntary movement, seizure and syncope.       Physical Exam Updated Vital Signs BP 155/94 (BP Location: Left Arm)   Pulse 67   Temp 98 F (36.7 C) (Oral)   Resp 16   Ht 5\' 6"  (1.676 m)   Wt 134 lb (60.8 kg)   SpO2 100%   BMI 21.63 kg/m   Physical Exam 0720: Physical examination:  Nursing notes reviewed; Vital signs and O2 SAT reviewed;   Constitutional: Well developed, Well nourished, Well hydrated, In no acute distress; Head:  Normocephalic, atraumatic; Eyes: EOMI, PERRL, No scleral icterus; ENMT: Mouth and pharynx normal, Mucous membranes moist; Neck: Supple, Full range of motion, No lymphadenopathy; Cardiovascular: Regular rate and rhythm, No gallop; Respiratory: Breath sounds clear & equal bilaterally, No wheezes.  Speaking full sentences with ease, Normal respiratory effort/excursion; Chest: Nontender, Movement normal; Abdomen: Soft, Nontender, Nondistended, Normal bowel sounds. +suprapubic catheter, no gross hematuria in foley bag.; Genitourinary: No CVA tenderness; Spine:  No midline CS, TS, LS tenderness. +TTP left lumbar paraspinal muscles.;; Extremities: Pulses normal, No tenderness, No edema, No calf edema or asymmetry.; Neuro: AA&Ox3, Major CN grossly intact.  Speech clear. Moves all extremities on stretcher spontaneously.; Skin: Color normal, Warm, Dry.   ED Treatments / Results  Labs (all labs ordered are listed, but only abnormal results are displayed)   EKG  EKG Interpretation None       Radiology   Procedures Procedures (including critical care time)  Medications Ordered in ED Medications  morphine 4 MG/ML injection 4 mg (4 mg Intravenous Given 08/23/16 0726)  ondansetron (ZOFRAN) injection 4 mg (4 mg Intravenous Given 08/23/16 0726)     Initial Impression / Assessment and Plan / ED Course  I have reviewed the triage vital signs and the nursing notes.  Pertinent labs & imaging results that were available during my care of the patient were reviewed by me and considered in my medical decision making (see chart for details).  MDM Reviewed: previous chart, nursing note and vitals Reviewed previous: labs and CT scan Interpretation: CT scan and labs   Results for orders placed or performed during the hospital encounter of 08/23/16  Urinalysis, Routine w reflex microscopic (not at [facility])  Result  Value Ref Range   Color, Urine YELLOW YELLOW   APPearance CLEAR CLEAR   Specific Gravity, Urine 1.010 1.005 - 1.030   pH 7.5 5.0 - 8.0   Glucose, UA NEGATIVE NEGATIVE mg/dL   Hgb urine dipstick LARGE (A) NEGATIVE   Bilirubin Urine NEGATIVE NEGATIVE   Ketones, ur NEGATIVE NEGATIVE mg/dL   Protein, ur TRACE (A) NEGATIVE mg/dL   Nitrite POSITIVE (A) NEGATIVE   Leukocytes, UA LARGE (A) NEGATIVE  Urine microscopic-add on  Result Value Ref Range   Squamous Epithelial / LPF NONE SEEN NONE SEEN  WBC, UA TOO NUMEROUS TO COUNT 0 - 5 WBC/hpf   RBC / HPF TOO NUMEROUS TO COUNT 0 - 5 RBC/hpf   Bacteria, UA MANY (A) NONE SEEN  Basic metabolic panel  Result Value Ref Range   Sodium 141 135 - 145 mmol/L   Potassium 3.8 3.5 - 5.1 mmol/L   Chloride 108 101 - 111 mmol/L   CO2 25 22 - 32 mmol/L   Glucose, Bld 107 (H) 65 - 99 mg/dL   BUN 40 (H) 6 - 20 mg/dL   Creatinine, Ser 1.69 (H) 0.61 - 1.24 mg/dL   Calcium 9.5 8.9 - 10.3 mg/dL   GFR calc non Af Amer 38 (L) >60 mL/min   GFR calc Af Amer 45 (L) >60 mL/min   Anion gap 8 5 - 15  Lactic acid, plasma  Result Value Ref Range   Lactic Acid, Venous 1.1 0.5 - 1.9 mmol/L  CBC with Differential  Result Value Ref Range   WBC 11.2 (H) 4.0 - 10.5 K/uL   RBC 4.74 4.22 - 5.81 MIL/uL   Hemoglobin 14.6 13.0 - 17.0 g/dL   HCT 44.2 39.0 - 52.0 %   MCV 93.2 78.0 - 100.0 fL   MCH 30.8 26.0 - 34.0 pg   MCHC 33.0 30.0 - 36.0 g/dL   RDW 13.4 11.5 - 15.5 %   Platelets 143 (L) 150 - 400 K/uL   Neutrophils Relative % 82 %   Neutro Abs 9.1 (H) 1.7 - 7.7 K/uL   Lymphocytes Relative 10 %   Lymphs Abs 1.1 0.7 - 4.0 K/uL   Monocytes Relative 4 %   Monocytes Absolute 0.4 0.1 - 1.0 K/uL   Eosinophils Relative 4 %   Eosinophils Absolute 0.5 0.0 - 0.7 K/uL   Basophils Relative 0 %   Basophils Absolute 0.0 0.0 - 0.1 K/uL   Ct Renal Stone Study Result Date: 08/23/2016 CLINICAL DATA:  75 year old male with left flank pain since 0400 hours. In dwelling suprapubic  catheter with new gross hematuria in catheter bag. Initial encounter. EXAM: CT ABDOMEN AND PELVIS WITHOUT CONTRAST TECHNIQUE: Multidetector CT imaging of the abdomen and pelvis was performed following the standard protocol without IV contrast. COMPARISON:  CT Abdomen and Pelvis 08/04/2015. FINDINGS: Lower chest: Resolved left pleural effusion since 2016. No pericardial effusion. Centrilobular atelectasis and mild bibasilar scarring. No upper abdominal free air. Hepatobiliary: Surgically absent gallbladder. Chronic choledocholithiasis with stable to mildly progressed intra and extrahepatic biliary ductal dilatation. An oblong 18 mm calculus remains present in the distal CBD (coronal images 35 and 36). The CBD is not dilated to 15 mm diameter versus 14 mm in 2016. Pancreas: Negative aside from the distal CBD obstruction. Spleen: Negative. Adrenals/Urinary Tract: Negative adrenal glands. Chronic right renal atrophy with right renal artery calcifications and a 6 mm nonobstructing right lower pole calculus. No right hydronephrosis or hydroureter. Severely enlarged and obstructed left kidney with confluent left para renal space stranding and/or fluid. The left ureter is irregular and dilated to the ureterovesical junction. There is evidence of left ureter urothelial thickening and an irregular contour of the proximal third of the left ureter, but no discrete mass or calculus within the left ureter. Multiple intrarenal calculi in measuring up to 5 to 7 mm are noted. Superimposed left renal artery vascular calcifications. Compared to 2016 the left hydronephrosis is stable. Superimposed exophytic upper pole renal cysts with simple fluid densitometry again noted. There is a suprapubic catheter in place, the urinary bladder is decompressed but  thick walled and mildly irregular appearing. Increased calcifications within the bladder including a 2-3 mm central posterior bladder calcification and larger 14 mm irregular  calcification near the right UVJ. Stomach/Bowel: Retained stool in the colon. Sequelae of left hemicolectomy with transverse to Sigmoid colon anastomosis with no adverse features. The cecum is located in the right hemipelvis. The appendix is surgically absent. There is flocculated material in the small bowel. No dilated small bowel. Chronic rectus muscle diastases with broad-based ventral abdominal protrusion of bowel and mesentery, not a bona fide bowel hernia. Duodenum within normal limits. Vascular/Lymphatic: Extensive Aortoiliac calcified atherosclerosis noted. Chronic severe stenosis at the aortoiliac bifurcation with highly diminutive appearance of the common and distal bilateral iliac arteries. No lymphadenopathy. Reproductive: Negative. Other: No pelvic free fluid. Musculoskeletal: Osteopenia. No acute osseous abnormality identified. IMPRESSION: 1. Severe left renal obstruction very similar to that on the 2016 CT Abdomen and Pelvis. Severe left hydronephrosis and para-renal stranding. Thickened and irregular left ureter, especially the proximal third, with no ureteral calculus or discrete obstructing lesion. Multiple nonobstructing left intrarenal calculi. Superimposed chronic right renal atrophy and nephrolithiasis. Suprapubic catheter in place with thickened but diminutive urinary bladder. Increased calcifications within the bladder compared to 2016, although none in proximity of the left UVJ. Overall, Cystoscopy and retrograde study of the left ureter may be most valuable at this point. 2. Chronic bulky 18 mm choledocholithiasis with distal CBD obstruction. Moderate to severe intra- and extrahepatic biliary ductal enlargement has mildly progressed. 3. Chronic severe aortic and iliac calcified atherosclerosis with suspected high-grade chronic aortoiliac bifurcation and common iliac artery stenosis. 4. Small left pleural effusion has resolved since 2016. Electronically Signed   By: Genevie Ann M.D.   On:  08/23/2016 08:24   Results for SEMISI, BIELA (MRN [id number]) as of 08/23/2016 09:20  Ref. Range 08/08/2015 02:49 08/09/2015 05:00 09/13/2015 07:00 08/23/2016 08:05  BUN Latest Ref Range: 6 - 20 mg/dL 19 20 23  (H) 40 (H)  Creatinine Latest Ref Range: 0.61 - 1.24 mg/dL 1.47 (H) 1.47 (H) 1.72 (H) 1.69 (H)    1010:  +UTI, UC pending; will dose IV rocephin. T/C to Uro Dr. [name], case discussed, including:  HPI, pertinent PM/SHx, VS/PE, dx testing, ED course and treatment:  He has viewed the CT images, agrees with ED treatment, requests to admit to [facility] service, transfer to [facility] (pt may need perc tube) and he will consult. T/C to [facility] Dr. Sarajane Jews, case discussed, including:  HPI, pertinent PM/SHx, VS/PE, dx testing, ED course and treatment:  Agreeable to facilitate transfer/admit to [facility], requests to write temporary orders, obtain medical bed to team [facility].   Final Clinical Impressions(s) / ED Diagnoses   Final diagnoses:  None    New Prescriptions New Prescriptions   No medications on file     Francine Graven, DO 08/26/16 1626

## 2016-08-23 NOTE — Consult Note (Addendum)
Urology Consult  Consulting MD: Murray Hodgkins, M.D.  CC: Kidney infection, obstruction  HPI: This is a 75 year old male who is seen regularly in our [place] office.  The patient has a neurogenic bladder and has had an indwelling suprapubic tube for several years now.  This is changed on a monthly basis in our office.  Almost a year ago, he underwent left percutaneous nephrolithotomy for an obstructing left UPJ stone as well as a large lower pole stone.  He was stented after that procedure.  The stent was removed within a month, in December 2017.  The patient is admitted at this time with a 12 hour history of left flank pain, nausea, as well as blood in the urine.  He did have mild leukocytosis.  Because of a possibility of infection and this obstruction, he is admitted for further management.  I was called from the emergency room at [facility], and recommended transfer here, as in looking at the patient's CT scan, there is no evidence of stone between the left kidney and the bladder, but he does have a hydronephrosis, and I felt that the patient would most likely, due to his neurogenic bladder and long-term catheterization, need percutaneous drainage of this with eventual nephrostogram for further imaging of his left ureter.  PMH: Past Medical History:  Diagnosis Date  . Anemia   . Arthritis   . Back pain, chronic   . Bilateral carotid bruits   . C. difficile colitis 09/2011  . CAD (coronary artery disease)   . Carotid artery stenosis   . Cerebrovascular disease   . Colon polyps   . Dyslipidemia   . Dysphagia 10/07/2011  . Encephalopathy   . Gait disorder   . HA (headache)   . High grade dysplasia in colonic adenoma 09/2005  . HTN (hypertension), malignant 10/06/2011  . Hypernatremia   . Hypokalemia   . Hypothyroidism 10/08/2011  . Insomnia   . Junctional rhythm   . Kidney stones   . MS (multiple sclerosis) ([place])   . Neuromuscular disorder ([place])   . OSA (obstructive sleep  apnea)   . Paroxysmal atrial tachycardia ([place])   . Peripheral vascular disease ([place])   . Pneumonia 4 yrs ago  . Pulmonary fibrosis ([place]) 10/06/2011  . Pulmonary nodule 10/08/2011  . PVD (peripheral vascular disease) ([place])   . Sacral ulcer ([place])   . Sleep apnea   . TIA (transient ischemic attack)   . Tremors of nervous system 10/08/2011  . Tremors of nervous system   . Urinary tract infection     PSH: Past Surgical History:  Procedure Laterality Date  . APPENDECTOMY  09/2005   at time of left hemicolectomy  . BACK SURGERY  1976/1979   lower  . CHOLECYSTECTOMY     Dr. Tamala Julian  . COLON SURGERY  09/2005   Fleishman: four tubular adenomas, large adenomatous polyp with HIGH GRADE dysplasia  . COLONOSCOPY  11/2004   Dr. Sharol Roussel sessile polyp splenic flexure, 75mm sessile polyp desc colon, tubulovillous adenoma (bx not removed)  . COLONOSCOPY  01/2005   poor prep, polyp could not be found  . COLONOSCOPY  05/2005   with EMR, polypectomy Dr. Olegario Messier, bx showed high grade dysplasia, partially resected  . COLONOSCOPY  09/2005   Dr. Arsenio Loader, [place] ink tattooing, four villous colon polyp (3 had been missed on previous colonoscopies due to limitations of procedures  . COLONOSCOPY  09/2006   normal TI, no polyps  . COLONOSCOPY  10/2007   Dr. Imogene Burn distal mammillations, benign bx, normal TI, random bx neg for microscopic colitis  . INGUINAL HERNIA REPAIR  1971   bilateral  . KIDNEY STONE SURGERY  09/13/2015  . NEPHROLITHOTOMY Left 09/13/2015   Procedure: LEFT PERCUTANEOUS NEPHROLITHOTOMY ;  Surgeon: Franchot Gallo, MD;  Location: [facility];  Service: Urology;  Laterality: Left;  . SUPRAPUBIC CATHETER INSERTION      Allergies: Allergies  Allergen Reactions  . Tetracyclines & Related Anaphylaxis and Rash  . Ciprofloxacin     Trouble swallowing unknown reaction according to wife     Medications: Prescriptions Prior to Admission  Medication Sig Dispense Refill Last Dose  .  carvedilol (COREG) 6.25 MG tablet Take 1 tablet (6.25 mg total) by mouth 2 (two) times daily with a meal.   08/22/2016 at 2130  . diazepam (VALIUM) 5 MG tablet Take 1 tablet (5 mg total) by mouth 2 (two) times daily. 60 tablet 5 08/22/2016 at Unknown time  . HYDROcodone-acetaminophen (NORCO) 7.5-325 MG per tablet Take 1 tablet by mouth 2 (two) times daily. Max APAP 3 GM IN 24 HOURS FROM ALL SOURCES (Patient taking differently: Take 1 tablet by mouth every 6 (six) hours as needed for moderate pain. Max APAP 3 GM IN 24 HOURS FROM ALL SOURCES) 60 tablet 0 08/22/2016 at Unknown time  . levothyroxine (SYNTHROID, LEVOTHROID) 88 MCG tablet Take 88 mcg by mouth daily before breakfast.   08/22/2016 at Unknown time  . potassium chloride SA (K-DUR,KLOR-CON) 20 MEQ tablet Take 20 mEq by mouth daily.   08/22/2016 at Unknown time  . solifenacin (VESICARE) 10 MG tablet Take 10 mg by mouth daily as needed.   Past Month at Unknown time  . verapamil (CALAN) 40 MG tablet Take 40 mg by mouth 2 (two) times daily.   08/22/2016 at Unknown time     Social History: Social History   Social History  . Marital status: Married    Spouse name: Pricilla Holm  . Number of children: 2  . Years of education: 10   Occupational History  . disability   .  Retired   Social History Main Topics  . Smoking status: Former Smoker    Types: Cigarettes  . Smokeless tobacco: Never Used     Comment: quit remote  . Alcohol use No  . Drug use: No  . Sexual activity: Not on file   Other Topics Concern  . Not on file   Social History Narrative   Patient is married Pricilla Holm) and  Lives w/ wife.   Patient is retired.   Patient has a 10th grade education.   Patient has two children.      Patient drinks about 1-2 sodas daily.   Patient is left handed.     Family History: Family History  Problem Relation Age of Onset  . Cirrhosis Brother     etoh  . Stroke Mother 32  . Coronary artery disease Father 44  . Heart attack Brother    . Cancer Sister   . Multiple sclerosis    . Colon cancer Neg Hx     Review of Systems: Positive: Left flank pain, nausea.  Also, blood in the urine this morning. Negative:   A further 10 point review of systems was negative except what is listed in the HPI.  Physical Exam: @VITALS2 @ General: No acute distress.  Awake. Head:  Normocephalic.  Atraumatic. ENT:  EOMI.  Mucous membranes moist Neck:  Supple.  No lymphadenopathy.  CV:  S1 present. S2 present. Regular rate. Pulmonary: Equal effort bilaterally.  Clear to auscultation bilaterally. Abdomen: Soft.  Non- tender to palpation anteriorly.  There is mild left CVA tenderness.  Suprapubic tube is in place and draining adequately. Skin:  Normal turgor.  No visible rash. Extremity: No gross deformity of bilateral upper extremities.  No gross deformity of bilateral lower extremities. Neurologic: Alert. Appropriate mood.    Studies:  Recent Labs     08/23/16  0805  HGB  14.6  WBC  11.2*  PLT  143*    Recent Labs     08/23/16  0805  NA  141  K  3.8  CL  108  CO2  25  BUN  40*  CREATININE  1.69*  CALCIUM  9.5  GFRNONAA  38*  GFRAA  45*     No results for input(s): INR, APTT in the last 72 hours.  Invalid input(s): PT   Invalid input(s): ABG  I have independently reviewed the patient's CT scan and the above laboratories.  Assessment:  1.  Left hydronephrosis-this may be residual from his prior left UPJ stone, but the ureter is dilated down to the distal ureter.  There is no evidence of obstructing stone.  2.  Possible urinary tract infection  Plan: 1.  As the patient is fairly comfortable at this time, and is not febrile/has no evidence of sepsis, I think pain management and adequate antibiotic management at this point is keep.  I do not think we need to proceed with emergent/urgent decompression of his left renal unit.  2.  I will ask the hospital service to help me manage the patient from medical  standpoint.  3.  Unless the patient has significant change in his status, I think we can wait until early in the week for her cutaneous nephrostomy tube placement.  Eventually, nephrostogram would be appreciated to delineate the extent and location of any obstruction.    [phone]

## 2016-08-24 ENCOUNTER — Observation Stay (HOSPITAL_COMMUNITY): Payer: Medicare Other

## 2016-08-24 DIAGNOSIS — K8051 Calculus of bile duct without cholangitis or cholecystitis with obstruction: Secondary | ICD-10-CM | POA: Diagnosis present

## 2016-08-24 DIAGNOSIS — J8410 Pulmonary fibrosis, unspecified: Secondary | ICD-10-CM | POA: Diagnosis present

## 2016-08-24 DIAGNOSIS — E860 Dehydration: Secondary | ICD-10-CM | POA: Diagnosis present

## 2016-08-24 DIAGNOSIS — N132 Hydronephrosis with renal and ureteral calculous obstruction: Secondary | ICD-10-CM | POA: Diagnosis not present

## 2016-08-24 DIAGNOSIS — I2510 Atherosclerotic heart disease of native coronary artery without angina pectoris: Secondary | ICD-10-CM | POA: Diagnosis present

## 2016-08-24 DIAGNOSIS — I130 Hypertensive heart and chronic kidney disease with heart failure and stage 1 through stage 4 chronic kidney disease, or unspecified chronic kidney disease: Secondary | ICD-10-CM | POA: Diagnosis present

## 2016-08-24 DIAGNOSIS — N179 Acute kidney failure, unspecified: Secondary | ICD-10-CM | POA: Diagnosis not present

## 2016-08-24 DIAGNOSIS — N136 Pyonephrosis: Secondary | ICD-10-CM | POA: Diagnosis present

## 2016-08-24 DIAGNOSIS — I10 Essential (primary) hypertension: Secondary | ICD-10-CM | POA: Diagnosis not present

## 2016-08-24 DIAGNOSIS — I739 Peripheral vascular disease, unspecified: Secondary | ICD-10-CM | POA: Diagnosis present

## 2016-08-24 DIAGNOSIS — N200 Calculus of kidney: Secondary | ICD-10-CM | POA: Diagnosis present

## 2016-08-24 DIAGNOSIS — N1330 Unspecified hydronephrosis: Secondary | ICD-10-CM | POA: Diagnosis not present

## 2016-08-24 DIAGNOSIS — D696 Thrombocytopenia, unspecified: Secondary | ICD-10-CM | POA: Diagnosis present

## 2016-08-24 DIAGNOSIS — N183 Chronic kidney disease, stage 3 (moderate): Secondary | ICD-10-CM | POA: Diagnosis not present

## 2016-08-24 DIAGNOSIS — I679 Cerebrovascular disease, unspecified: Secondary | ICD-10-CM | POA: Diagnosis present

## 2016-08-24 DIAGNOSIS — D72829 Elevated white blood cell count, unspecified: Secondary | ICD-10-CM | POA: Diagnosis not present

## 2016-08-24 DIAGNOSIS — E870 Hyperosmolality and hypernatremia: Secondary | ICD-10-CM | POA: Diagnosis present

## 2016-08-24 DIAGNOSIS — E876 Hypokalemia: Secondary | ICD-10-CM | POA: Diagnosis not present

## 2016-08-24 DIAGNOSIS — G4733 Obstructive sleep apnea (adult) (pediatric): Secondary | ICD-10-CM | POA: Diagnosis present

## 2016-08-24 DIAGNOSIS — R1310 Dysphagia, unspecified: Secondary | ICD-10-CM | POA: Diagnosis present

## 2016-08-24 DIAGNOSIS — E039 Hypothyroidism, unspecified: Secondary | ICD-10-CM | POA: Diagnosis present

## 2016-08-24 DIAGNOSIS — R109 Unspecified abdominal pain: Secondary | ICD-10-CM | POA: Diagnosis not present

## 2016-08-24 DIAGNOSIS — E785 Hyperlipidemia, unspecified: Secondary | ICD-10-CM | POA: Diagnosis present

## 2016-08-24 DIAGNOSIS — N10 Acute tubulo-interstitial nephritis: Secondary | ICD-10-CM

## 2016-08-24 DIAGNOSIS — I5022 Chronic systolic (congestive) heart failure: Secondary | ICD-10-CM | POA: Diagnosis present

## 2016-08-24 DIAGNOSIS — N390 Urinary tract infection, site not specified: Secondary | ICD-10-CM | POA: Diagnosis not present

## 2016-08-24 DIAGNOSIS — N138 Other obstructive and reflux uropathy: Secondary | ICD-10-CM | POA: Diagnosis present

## 2016-08-24 DIAGNOSIS — I471 Supraventricular tachycardia: Secondary | ICD-10-CM | POA: Diagnosis present

## 2016-08-24 DIAGNOSIS — G35 Multiple sclerosis: Secondary | ICD-10-CM | POA: Diagnosis present

## 2016-08-24 DIAGNOSIS — Z936 Other artificial openings of urinary tract status: Secondary | ICD-10-CM | POA: Diagnosis not present

## 2016-08-24 DIAGNOSIS — N319 Neuromuscular dysfunction of bladder, unspecified: Secondary | ICD-10-CM | POA: Diagnosis present

## 2016-08-24 LAB — BASIC METABOLIC PANEL
ANION GAP: 7 (ref 5–15)
BUN: 36 mg/dL — AB (ref 6–20)
CALCIUM: 8.8 mg/dL — AB (ref 8.9–10.3)
CO2: 25 mmol/L (ref 22–32)
Chloride: 115 mmol/L — ABNORMAL HIGH (ref 101–111)
Creatinine, Ser: 2.19 mg/dL — ABNORMAL HIGH (ref 0.61–1.24)
GFR calc Af Amer: 33 mL/min — ABNORMAL LOW (ref >60)
GFR, EST NON AFRICAN AMERICAN: 28 mL/min — AB (ref >60)
GLUCOSE: 122 mg/dL — AB (ref 65–99)
Potassium: 3.6 mmol/L (ref 3.5–5.1)
Sodium: 147 mmol/L — ABNORMAL HIGH (ref 135–145)

## 2016-08-24 NOTE — Progress Notes (Signed)
Subjective: Patient reports that he is feeling a bit better.  No shakes or chills.  No blood in his urine over the past 24 hours.  Objective: Vital signs in last 24 hours: Temp:  [99.3 F (37.4 C)-100.1 F (37.8 C)] 99.6 F (37.6 C) (11/26 0647) Pulse Rate:  [70-84] 72 (11/26 0647) Resp:  [14-18] 18 (11/25 2057) BP: (104-152)/(67-86) 152/70 (11/26 0647) SpO2:  [93 %-100 %] 94 % (11/26 0647)  Intake/Output from previous day: 11/25 0701 - 11/26 0700 In: 1586.3 [P.O.:1360; I.V.:176.3; IV Piggyback:50] Out: 1050 [Urine:1050] Intake/Output this shift: No intake/output data recorded.  Physical Exam:  Constitutional: Vital signs reviewed. WD WN in NAD   Eyes: PERRL, No scleral icterus.   Pulmonary/Chest: Normal effort Abdominal: Soft.  Minimal left upper quadrant and CVA tenderness. Non-distended, bowel sounds are normal, no masses, organomegaly, or guarding present.    Lab Results:  Recent Labs  08/23/16 0805  HGB 14.6  HCT 44.2   BMET  Recent Labs  08/23/16 0805 08/24/16 0510  NA 141 147*  K 3.8 3.6  CL 108 115*  CO2 25 25  GLUCOSE 107* 122*  BUN 40* 36*  CREATININE 1.69* 2.19*  CALCIUM 9.5 8.8*   No results for input(s): LABPT, INR in the last 72 hours. No results for input(s): LABURIN in the last 72 hours. Results for orders placed or performed during the hospital encounter of 08/03/15  MRSA PCR Screening     Status: None   Collection Time: 08/04/15  9:18 PM  Result Value Ref Range Status   MRSA by PCR NEGATIVE NEGATIVE Final    Comment:        The GeneXpert MRSA Assay (FDA approved for NASAL specimens only), is one component of a comprehensive MRSA colonization surveillance program. It is not intended to diagnose MRSA infection nor to guide or monitor treatment for MRSA infections.   Urine culture     Status: None   Collection Time: 08/04/15  9:45 PM  Result Value Ref Range Status   Specimen Description URINE, CLEAN CATCH  Final   Special  Requests cipro  Final   Culture MULTIPLE SPECIES PRESENT, SUGGEST RECOLLECTION  Final   Report Status 08/06/2015 FINAL  Final  Culture, blood (routine x 2)     Status: None   Collection Time: 08/04/15 11:30 PM  Result Value Ref Range Status   Specimen Description BLOOD LEFT ARM  Final   Special Requests BOTTLES DRAWN AEROBIC AND ANAEROBIC 10CC  Final   Culture NO GROWTH 5 DAYS  Final   Report Status 08/10/2015 FINAL  Final  Culture, blood (routine x 2)     Status: None   Collection Time: 08/04/15 11:48 PM  Result Value Ref Range Status   Specimen Description BLOOD LEFT HAND  Final   Special Requests IN PEDIATRIC BOTTLE 1CC  Final   Culture NO GROWTH 5 DAYS  Final   Report Status 08/10/2015 FINAL  Final  Urine culture     Status: None   Collection Time: 08/05/15 11:51 AM  Result Value Ref Range Status   Specimen Description URINE, SUPRAPUBIC  Final   Special Requests Normal  Final   Culture NO GROWTH 1 DAY  Final   Report Status 08/06/2015 FINAL  Final    Studies/Results: Ct Renal Stone Study  Result Date: 08/23/2016 CLINICAL DATA:  75 year old male with left flank pain since 0400 hours. In dwelling suprapubic catheter with new gross hematuria in catheter bag. Initial encounter. EXAM: CT ABDOMEN AND  PELVIS WITHOUT CONTRAST TECHNIQUE: Multidetector CT imaging of the abdomen and pelvis was performed following the standard protocol without IV contrast. COMPARISON:  CT Abdomen and Pelvis 08/04/2015. FINDINGS: Lower chest: Resolved left pleural effusion since 2016. No pericardial effusion. Centrilobular atelectasis and mild bibasilar scarring. No upper abdominal free air. Hepatobiliary: Surgically absent gallbladder. Chronic choledocholithiasis with stable to mildly progressed intra and extrahepatic biliary ductal dilatation. An oblong 18 mm calculus remains present in the distal CBD (coronal images 35 and 36). The CBD is not dilated to 15 mm diameter versus 14 mm in 2016. Pancreas: Negative  aside from the distal CBD obstruction. Spleen: Negative. Adrenals/Urinary Tract: Negative adrenal glands. Chronic right renal atrophy with right renal artery calcifications and a 6 mm nonobstructing right lower pole calculus. No right hydronephrosis or hydroureter. Severely enlarged and obstructed left kidney with confluent left para renal space stranding and/or fluid. The left ureter is irregular and dilated to the ureterovesical junction. There is evidence of left ureter urothelial thickening and an irregular contour of the proximal third of the left ureter, but no discrete mass or calculus within the left ureter. Multiple intrarenal calculi in measuring up to 5 to 7 mm are noted. Superimposed left renal artery vascular calcifications. Compared to 2016 the left hydronephrosis is stable. Superimposed exophytic upper pole renal cysts with simple fluid densitometry again noted. There is a suprapubic catheter in place, the urinary bladder is decompressed but thick walled and mildly irregular appearing. Increased calcifications within the bladder including a 2-3 mm central posterior bladder calcification and larger 14 mm irregular calcification near the right UVJ. Stomach/Bowel: Retained stool in the colon. Sequelae of left hemicolectomy with transverse to Sigmoid colon anastomosis with no adverse features. The cecum is located in the right hemipelvis. The appendix is surgically absent. There is flocculated material in the small bowel. No dilated small bowel. Chronic rectus muscle diastases with broad-based ventral abdominal protrusion of bowel and mesentery, not a bona fide bowel hernia. Duodenum within normal limits. Vascular/Lymphatic: Extensive Aortoiliac calcified atherosclerosis noted. Chronic severe stenosis at the aortoiliac bifurcation with highly diminutive appearance of the common and distal bilateral iliac arteries. No lymphadenopathy. Reproductive: Negative. Other: No pelvic free fluid. Musculoskeletal:  Osteopenia. No acute osseous abnormality identified. IMPRESSION: 1. Severe left renal obstruction very similar to that on the 2016 CT Abdomen and Pelvis. Severe left hydronephrosis and para-renal stranding. Thickened and irregular left ureter, especially the proximal third, with no ureteral calculus or discrete obstructing lesion. Multiple nonobstructing left intrarenal calculi. Superimposed chronic right renal atrophy and nephrolithiasis. Suprapubic catheter in place with thickened but diminutive urinary bladder. Increased calcifications within the bladder compared to 2016, although none in proximity of the left UVJ. Overall, Cystoscopy and retrograde study of the left ureter may be most valuable at this point. 2. Chronic bulky 18 mm choledocholithiasis with distal CBD obstruction. Moderate to severe intra- and extrahepatic biliary ductal enlargement has mildly progressed. 3. Chronic severe aortic and iliac calcified atherosclerosis with suspected high-grade chronic aortoiliac bifurcation and common iliac artery stenosis. 4. Small left pleural effusion has resolved since 2016. Electronically Signed   By: Genevie Ann M.D.   On: 08/23/2016 08:24    Assessment/Plan:   Hospital day #2-left hydronephrosis, possible ureteral obstruction.  He is feeling better.    I will arrange for left renal sound today.  If hydronephrosis still present, I will arrange for left percutaneous nephrostomy tube placement tomorrow.  I feel that this is a better option for drainage for the left  kidney, as the patient has a contracted bladder with long-term indwelling suprapubic tube, and trigone is more than likely obscured.   LOS: 0 days   Franchot Gallo M 08/24/2016, 8:41 AM

## 2016-08-24 NOTE — Progress Notes (Addendum)
Patient ID: Jordan Ward, male   DOB: 01/19/1941, 76 y.o.   MRN: [id number]  PROGRESS NOTE    ANDREY MCCASKILL  DGU:[id number] DOB: [birth year] DOA: 08/23/2016  PCP: [name], MD   Brief Narrative:  75 year old man with past medical history of nephrolithiasis who presented to [facility] with acute onset of left flank pain and mild hematuria. CT abdomen and pelvis showed severe left renal obstruction, severe left hydronephrosis, Chronic bulky choledocholithiasis with distal CBD obstruction moderate to severe intra-and extrahepatic biliary ductal enlargement. EDP discussed with urology who recommended transfer to [facility] for further evaluation and possible percutaneous nephrostomy placement.   Assessment & Plan:   Principal Problem:   Left kidney hydronephrosis / possible ureteral obstruction / Acute pyelonephritis / Leukocytosis  - Severe left hydronephrosis. No discrete calculus noted - Per GU, will arrange for [place] (which showed persistent hydro) so plan for left percutaneous nephrostomy tube placement - Continue pain management efforts - Continue rocephin   Active Problems: Chronic bulky choledocholithiasis with distal CBD obstruction - Also moderate to severe intra-and extrahepatic biliary ductal enlargement mildly progressed. Compared to previous studies this is a chronic finding. No right upper quadrant pain.  Chronic kidney disease stage III - Creatinine at baseline  Chronic severe aortic and iliac calcified atherosclerosis, suspected high-grade chronic aortoiliac bifurcation and common iliac artery stenosis - This is a chronic finding seen on previous CT November 2016 - Asymptomatic  Essential hypertension - Continue carvedilol, verapamil   Hypothyroidism - Continue synthroid   Hypernatremia - Due to acute infection, dehydration - Follow up BMP in am - Continue IV fluids    DVT prophylaxis: SCD's bilaterally  Code Status: full code  Family Communication: no family  at the bedside this am Disposition Plan: home once cleared by GU   Consultants:   Urology   Procedures:   None   Antimicrobials:   Rocephin 08/23/2016 -->    Subjective: No overnight events.   Objective: Vitals:   08/23/16 2057 08/24/16 0052 08/24/16 0647 08/24/16 1409  BP: 104/67  (!) 152/70 (!) 134/53  Pulse: 74  72 61  Resp: 18   18  Temp: 100.1 F (37.8 C) 99.7 F (37.6 C) 99.6 F (37.6 C) 98.3 F (36.8 C)  TempSrc: Oral Oral Oral Oral  SpO2: 95%  94% 95%  Weight:      Height:        Intake/Output Summary (Last 24 hours) at 08/24/16 1432 Last data filed at 08/24/16 0648  Gross per 24 hour  Intake          1536.25 ml  Output             1050 ml  Net           486.25 ml   Filed Weights   08/23/16 0610  Weight: 60.8 kg (134 lb)    Examination:  General exam: Appears calm and comfortable  Respiratory system: Clear to auscultation. Respiratory effort normal. Cardiovascular system: S1 & S2 heard, RRR. No JVD, murmurs, rubs, gallops or clicks. No pedal edema. Gastrointestinal system: right flank pain (+), (+) BS Central nervous system: Alert and oriented. No focal neurological deficits. Extremities: Symmetric 5 x 5 power. Skin: No rashes, lesions or ulcers Psychiatry: Judgement and insight appear normal. Mood & affect appropriate.   Data Reviewed: I have personally reviewed following labs and imaging studies  CBC:  Recent Labs Lab 08/23/16 0805  WBC 11.2*  NEUTROABS 9.1*  HGB 14.6  HCT 44.2  MCV 93.2  PLT 914*   Basic Metabolic Panel:  Recent Labs Lab 08/23/16 0805 08/24/16 0510  NA 141 147*  K 3.8 3.6  CL 108 115*  CO2 25 25  GLUCOSE 107* 122*  BUN 40* 36*  CREATININE 1.69* 2.19*  CALCIUM 9.5 8.8*   GFR: Estimated Creatinine Clearance: 25.8 mL/min (by C-G formula based on SCr of 2.19 mg/dL (H)). Liver Function Tests: No results for input(s): AST, ALT, ALKPHOS, BILITOT, PROT, ALBUMIN in the last 168 hours. No results for  input(s): LIPASE, AMYLASE in the last 168 hours. No results for input(s): AMMONIA in the last 168 hours. Coagulation Profile: No results for input(s): INR, PROTIME in the last 168 hours. Cardiac Enzymes: No results for input(s): CKTOTAL, CKMB, CKMBINDEX, TROPONINI in the last 168 hours. BNP (last 3 results) No results for input(s): PROBNP in the last 8760 hours. HbA1C: No results for input(s): HGBA1C in the last 72 hours. CBG: No results for input(s): GLUCAP in the last 168 hours. Lipid Profile: No results for input(s): CHOL, HDL, LDLCALC, TRIG, CHOLHDL, LDLDIRECT in the last 72 hours. Thyroid Function Tests: No results for input(s): TSH, T4TOTAL, FREET4, T3FREE, THYROIDAB in the last 72 hours. Anemia Panel: No results for input(s): VITAMINB12, FOLATE, FERRITIN, TIBC, IRON, RETICCTPCT in the last 72 hours. Urine analysis:    Component Value Date/Time   COLORURINE YELLOW 08/23/2016 [place] 08/23/2016 0613   LABSPEC 1.010 08/23/2016 0613   PHURINE 7.5 08/23/2016 0613   GLUCOSEU NEGATIVE 08/23/2016 0613   HGBUR LARGE (A) 08/23/2016 0613   BILIRUBINUR NEGATIVE 08/23/2016 0613   KETONESUR NEGATIVE 08/23/2016 0613   PROTEINUR TRACE (A) 08/23/2016 0613   UROBILINOGEN 0.2 07/17/2014 1215   NITRITE POSITIVE (A) 08/23/2016 0613   LEUKOCYTESUR LARGE (A) 08/23/2016 0613   Sepsis Labs: @LABRCNTIP (procalcitonin:4,lacticidven:4)   )No results found for this or any previous visit (from the past 240 hour(s)).    Radiology Studies: US Renal Result Date: 08/24/2016 Persistent moderate left hydronephrosis. Bilateral nephrolithiasis. Atrophic right kidney.   Ct Renal Stone Study Result Date: 08/23/2016 1. Severe left renal obstruction very similar to that on the 2016 CT Abdomen and Pelvis. Severe left hydronephrosis and para-renal stranding. Thickened and irregular left ureter, especially the proximal third, with no ureteral calculus or discrete obstructing lesion.  Multiple nonobstructing left intrarenal calculi. Superimposed chronic right renal atrophy and nephrolithiasis. Suprapubic catheter in place with thickened but diminutive urinary bladder. Increased calcifications within the bladder compared to 2016, although none in proximity of the left UVJ. Overall, Cystoscopy and retrograde study of the left ureter may be most valuable at this point. 2. Chronic bulky 18 mm choledocholithiasis with distal CBD obstruction. Moderate to severe intra- and extrahepatic biliary ductal enlargement has mildly progressed. 3. Chronic severe aortic and iliac calcified atherosclerosis with suspected high-grade chronic aortoiliac bifurcation and common iliac artery stenosis. 4. Small left pleural effusion has resolved since 2016.   Scheduled Meds: . carvedilol  6.25 mg Oral BID WC  . cefTRIAXone (ROCEPHIN)  IV  1 g Intravenous Q24H  . darifenacin  15 mg Oral Daily  . diazepam  5 mg Oral BID  . HYDROcodone-acetaminophen  1 tablet Oral BID  . levothyroxine  88 mcg Oral QAC breakfast  . sodium chloride flush  3 mL Intravenous Q12H  . verapamil  40 mg Oral BID   Continuous Infusions:   LOS: 0 days    Time spent: 25 minutes  Greater than 50% of the time spent on counseling  and coordinating the care.   Leisa Lenz, MD [facility] Pager [phone]  If 7PM-7AM, please contact night-coverage [email or web address] Password [facility] 08/24/2016, 2:32 PM

## 2016-08-24 NOTE — Progress Notes (Signed)
Moderate hydronephrosis still present on the left.  I feel most comfortable with the patient having his left kidney drained.  I will order for left percutaneous nephrostomy tube to be placed.

## 2016-08-25 ENCOUNTER — Inpatient Hospital Stay (HOSPITAL_COMMUNITY): Payer: Medicare Other

## 2016-08-25 DIAGNOSIS — D72829 Elevated white blood cell count, unspecified: Secondary | ICD-10-CM

## 2016-08-25 LAB — URINE CULTURE

## 2016-08-25 LAB — BASIC METABOLIC PANEL
ANION GAP: 7 (ref 5–15)
BUN: 33 mg/dL — AB (ref 6–20)
CO2: 25 mmol/L (ref 22–32)
Calcium: 8.7 mg/dL — ABNORMAL LOW (ref 8.9–10.3)
Chloride: 114 mmol/L — ABNORMAL HIGH (ref 101–111)
Creatinine, Ser: 1.99 mg/dL — ABNORMAL HIGH (ref 0.61–1.24)
GFR calc Af Amer: 37 mL/min — ABNORMAL LOW (ref >60)
GFR, EST NON AFRICAN AMERICAN: 32 mL/min — AB (ref >60)
Glucose, Bld: 115 mg/dL — ABNORMAL HIGH (ref 65–99)
POTASSIUM: 3.2 mmol/L — AB (ref 3.5–5.1)
SODIUM: 146 mmol/L — AB (ref 135–145)

## 2016-08-25 LAB — CBC
HCT: 37.5 % — ABNORMAL LOW (ref 39.0–52.0)
Hemoglobin: 12.2 g/dL — ABNORMAL LOW (ref 13.0–17.0)
MCH: 30.7 pg (ref 26.0–34.0)
MCHC: 32.5 g/dL (ref 30.0–36.0)
MCV: 94.2 fL (ref 78.0–100.0)
PLATELETS: 120 10*3/uL — AB (ref 150–400)
RBC: 3.98 MIL/uL — AB (ref 4.22–5.81)
RDW: 14.4 % (ref 11.5–15.5)
WBC: 8 10*3/uL (ref 4.0–10.5)

## 2016-08-25 LAB — PROTIME-INR
INR: 1.06
PROTHROMBIN TIME: 13.8 s (ref 11.4–15.2)

## 2016-08-25 MED ORDER — POTASSIUM CHLORIDE CRYS ER 20 MEQ PO TBCR
40.0000 meq | EXTENDED_RELEASE_TABLET | Freq: Once | ORAL | Status: AC
  Administered 2016-08-25: 40 meq via ORAL
  Filled 2016-08-25: qty 2

## 2016-08-25 MED ORDER — SODIUM CHLORIDE 0.9 % IV SOLN
250.0000 mL | INTRAVENOUS | Status: DC | PRN
  Administered 2016-08-25 – 2016-08-27 (×3): 250 mL via INTRAVENOUS

## 2016-08-25 NOTE — Progress Notes (Signed)
Patient ID: Jordan Ward, male   DOB: May 20, 1941, 75 y.o.   MRN: [id number] F/u renal US today shows stable left-sided hydronephrosis when compared to the renal ultrasound yesterday. Overall, hydronephrosis is felt to be improved compared to the severe hydronephrosis by CT on 08/23/2016. Above d/w pt/Dr. [name]- plan to hold on PCN for now.

## 2016-08-25 NOTE — Progress Notes (Addendum)
Patient ID: Jordan Ward, male   DOB: 05/16/1941, 75 y.o.   MRN: [id number]  PROGRESS NOTE    RAUNAK ANTUNA  WER:[id number] DOB: Apr 11, 1941 DOA: 08/23/2016  PCP: [name], MD   Brief Narrative:  75 year old man with past medical history of nephrolithiasis who presented to [facility] with acute onset of left flank pain and mild hematuria. CT abdomen and pelvis showed severe left renal obstruction, severe left hydronephrosis, Chronic bulky choledocholithiasis with distal CBD obstruction moderate to severe intra-and extrahepatic biliary ductal enlargement. EDP discussed with urology who recommended transfer to [facility] for further evaluation and possible percutaneous nephrostomy placement.  Assessment & Plan:   Principal Problem:   Left kidney hydronephrosis / possible ureteral obstruction / Acute pyelonephritis / Leukocytosis  - Severe left hydronephrosis. No discrete calculus noted - Per GU, hydronephrosis improving; no plan for perc nephr placement at this time  - Continue pain management efforts - Continue rocephin  - Urine cx with multiple species, none predominant   Active Problems: Chronic bulky choledocholithiasis with distal CBD obstruction - Also moderate to severe intra-and extrahepatic biliary ductal enlargement mildly progressed. Compared to previous studies this is a chronic finding. No right upper quadrant pain.  Hypokalemia - Due to acute infectious process - Supplemented this am - Follow up BMP in am   Chronic kidney disease stage III - Creatinine at baseline and improving   Chronic severe aortic and iliac calcified atherosclerosis, suspected high-grade chronic aortoiliac bifurcation and common iliac artery stenosis - This is a chronic finding seen on previous CT November 2016 - Asymptomatic  Essential hypertension - Continue carvedilol, verapamil   Hypothyroidism - Continue synthroid   Hypernatremia - Due to acute infection, dehydration - Continue IV  fluids  - Follow up BMP in am  Thrombocytopenia - Unclear etiology - Platelet 120 - Continue to monitor CBC daily     DVT prophylaxis: SCD's bilaterally  Code Status: full code  Family Communication: no family at the bedside this am Disposition Plan: home once cleared by GU   Consultants:   Urology   Procedures:   None   Antimicrobials:   Rocephin 08/23/2016 -->    Subjective: No overnight events.   Objective: Vitals:   08/24/16 2246 08/25/16 0322 08/25/16 0446 08/25/16 1057  BP: (!) 148/77  134/70 (!) 153/83  Pulse: 62  (!) 58   Resp: 18  18   Temp: 99 F (37.2 C) 99.2 F (37.3 C) 99.1 F (37.3 C)   TempSrc: Oral Oral Oral   SpO2: 95%  93%   Weight:      Height:        Intake/Output Summary (Last 24 hours) at 08/25/16 1126 Last data filed at 08/25/16 0900  Gross per 24 hour  Intake              960 ml  Output             1200 ml  Net             -240 ml   Filed Weights   08/23/16 0610  Weight: 60.8 kg (134 lb)    Examination:  General exam: Appears calm and comfortable  Respiratory system: Clear to auscultation. Respiratory effort normal. Cardiovascular system: S1 & S2 heard, RRR. No JVD, murmurs, rubs, gallops or clicks. No pedal edema. Gastrointestinal system: right flank pain (+), (+) BS Central nervous system: Alert and oriented. No focal neurological deficits. Extremities: Symmetric 5 x 5 power. Skin: No rashes,  lesions or ulcers Psychiatry: Judgement and insight appear normal. Mood & affect appropriate.   Data Reviewed: I have personally reviewed following labs and imaging studies  CBC:  Recent Labs Lab 08/23/16 0805 08/25/16 0441  WBC 11.2* 8.0  NEUTROABS 9.1*  --   HGB 14.6 12.2*  HCT 44.2 37.5*  MCV 93.2 94.2  PLT 143* 767*   Basic Metabolic Panel:  Recent Labs Lab 08/23/16 0805 08/24/16 0510 08/25/16 0441  NA 141 147* 146*  K 3.8 3.6 3.2*  CL 108 115* 114*  CO2 25 25 25   GLUCOSE 107* 122* 115*  BUN 40* 36*  33*  CREATININE 1.69* 2.19* 1.99*  CALCIUM 9.5 8.8* 8.7*   GFR: Estimated Creatinine Clearance: 28.4 mL/min (by C-G formula based on SCr of 1.99 mg/dL (H)). Liver Function Tests: No results for input(s): AST, ALT, ALKPHOS, BILITOT, PROT, ALBUMIN in the last 168 hours. No results for input(s): LIPASE, AMYLASE in the last 168 hours. No results for input(s): AMMONIA in the last 168 hours. Coagulation Profile:  Recent Labs Lab 08/25/16 0441  INR 1.06   Cardiac Enzymes: No results for input(s): CKTOTAL, CKMB, CKMBINDEX, TROPONINI in the last 168 hours. BNP (last 3 results) No results for input(s): PROBNP in the last 8760 hours. HbA1C: No results for input(s): HGBA1C in the last 72 hours. CBG: No results for input(s): GLUCAP in the last 168 hours. Lipid Profile: No results for input(s): CHOL, HDL, LDLCALC, TRIG, CHOLHDL, LDLDIRECT in the last 72 hours. Thyroid Function Tests: No results for input(s): TSH, T4TOTAL, FREET4, T3FREE, THYROIDAB in the last 72 hours. Anemia Panel: No results for input(s): VITAMINB12, FOLATE, FERRITIN, TIBC, IRON, RETICCTPCT in the last 72 hours. Urine analysis:    Component Value Date/Time   COLORURINE YELLOW 08/23/2016 [place] 08/23/2016 0613   LABSPEC 1.010 08/23/2016 0613   PHURINE 7.5 08/23/2016 0613   GLUCOSEU NEGATIVE 08/23/2016 0613   HGBUR LARGE (A) 08/23/2016 0613   BILIRUBINUR NEGATIVE 08/23/2016 0613   KETONESUR NEGATIVE 08/23/2016 0613   PROTEINUR TRACE (A) 08/23/2016 0613   UROBILINOGEN 0.2 07/17/2014 1215   NITRITE POSITIVE (A) 08/23/2016 0613   LEUKOCYTESUR LARGE (A) 08/23/2016 0613   Sepsis Labs: @LABRCNTIP (procalcitonin:4,lacticidven:4)   Recent Results (from the past 240 hour(s))  Urine culture     Status: Abnormal   Collection Time: 08/23/16  7:10 AM  Result Value Ref Range Status   Specimen Description URINE, CLEAN CATCH  Final   Special Requests NONE  Final   Culture MULTIPLE SPECIES PRESENT, SUGGEST  RECOLLECTION (A)  Final   Report Status 08/25/2016 FINAL  Final      Radiology Studies: US Renal Result Date: 08/24/2016 Persistent moderate left hydronephrosis. Bilateral nephrolithiasis. Atrophic right kidney.   Ct Renal Stone Study Result Date: 08/23/2016 1. Severe left renal obstruction very similar to that on the 2016 CT Abdomen and Pelvis. Severe left hydronephrosis and para-renal stranding. Thickened and irregular left ureter, especially the proximal third, with no ureteral calculus or discrete obstructing lesion. Multiple nonobstructing left intrarenal calculi. Superimposed chronic right renal atrophy and nephrolithiasis. Suprapubic catheter in place with thickened but diminutive urinary bladder. Increased calcifications within the bladder compared to 2016, although none in proximity of the left UVJ. Overall, Cystoscopy and retrograde study of the left ureter may be most valuable at this point. 2. Chronic bulky 18 mm choledocholithiasis with distal CBD obstruction. Moderate to severe intra- and extrahepatic biliary ductal enlargement has mildly progressed. 3. Chronic severe aortic and iliac calcified atherosclerosis  with suspected high-grade chronic aortoiliac bifurcation and common iliac artery stenosis. 4. Small left pleural effusion has resolved since 2016.  US Renal Result Date: 08/25/2016 Stable left-sided hydronephrosis when compared to the renal ultrasound yesterday. Overall, hydronephrosis is felt to be improved compared to the severe hydronephrosis by CT on 08/23/2016. Electronically Signed   By: Aletta Edouard M.D.   On: 08/25/2016 09:56     Scheduled Meds: . carvedilol  6.25 mg Oral BID WC  . cefTRIAXone (ROCEPHIN)  IV  1 g Intravenous Q24H  . darifenacin  15 mg Oral Daily  . diazepam  5 mg Oral BID  . HYDROcodone-acetaminophen  1 tablet Oral BID  . levothyroxine  88 mcg Oral QAC breakfast  . potassium chloride  40 mEq Oral Once  . sodium chloride flush  3 mL  Intravenous Q12H  . verapamil  40 mg Oral BID   Continuous Infusions:   LOS: 1 day    Time spent: 25 minutes  Greater than 50% of the time spent on counseling and coordinating the care.   Leisa Lenz, MD [facility] Pager [phone]  If 7PM-7AM, please contact night-coverage [email or web address] Password [email or web address] 08/25/2016, 11:26 AM

## 2016-08-25 NOTE — Progress Notes (Signed)
  Subjective: Patient reports persistent left flank discomfort.  No fever, chills, however.  Objective: Vital signs in last 24 hours: Temp:  [98.3 F (36.8 C)-99.2 F (37.3 C)] 99.1 F (37.3 C) (11/27 0446) Pulse Rate:  [58-62] 58 (11/27 0446) Resp:  [18] 18 (11/27 0446) BP: (134-148)/(53-77) 134/70 (11/27 0446) SpO2:  [93 %-95 %] 93 % (11/27 0446)  Intake/Output from previous day: 11/26 0701 - 11/27 0700 In: 1010 [P.O.:960; IV Piggyback:50] Out: 1200 [Urine:1200] Intake/Output this shift: No intake/output data recorded.  Physical Exam:  Constitutional: Vital signs reviewed. WD WN in NAD   Eyes: PERRL, No scleral icterus.   Pulmonary/Chest: Normal effort Extremities: No cyanosis or edema   Lab Results:  Recent Labs  08/23/16 0805 08/25/16 0441  HGB 14.6 12.2*  HCT 44.2 37.5*   BMET  Recent Labs  08/24/16 0510 08/25/16 0441  NA 147* 146*  K 3.6 3.2*  CL 115* 114*  CO2 25 25  GLUCOSE 122* 115*  BUN 36* 33*  CREATININE 2.19* 1.99*  CALCIUM 8.8* 8.7*    Recent Labs  08/25/16 0441  INR 1.06   No results for input(s): LABURIN in the last 72 hours. Results for orders placed or performed during the hospital encounter of 08/23/16  Urine culture     Status: Abnormal   Collection Time: 08/23/16  7:10 AM  Result Value Ref Range Status   Specimen Description URINE, CLEAN CATCH  Final   Special Requests NONE  Final   Culture MULTIPLE SPECIES PRESENT, SUGGEST RECOLLECTION (A)  Final   Report Status 08/25/2016 FINAL  Final    Studies/Results: US Renal  Result Date: 08/24/2016 CLINICAL DATA:  Patient with left sided hydronephrosis. EXAM: RENAL / URINARY TRACT ULTRASOUND COMPLETE COMPARISON:  CT abdomen pelvis 08/23/2016. FINDINGS: Right Kidney: Length: 7.3 cm. Renal cortical thinning. Increased renal cortical echogenicity. Within the inferior pole of the right kidney there is a 4 mm stone. No hydronephrosis. Left Kidney: Length: 11.6 cm. Normal renal cortical  thickness and echogenicity. Within the midpole of the left kidney there is an 8 mm stone. There is a 2.7 cm cyst off the superior pole of the kidney. Moderate hydronephrosis. Bladder: Decompressed with catheter. IMPRESSION: Persistent moderate left hydronephrosis. Bilateral nephrolithiasis. Atrophic right kidney. Electronically Signed   By: Lovey Newcomer M.D.   On: 08/24/2016 14:28    Assessment/Plan:   Left hydronephrosis with history of left renal calculi.  Question left distal ureteral stricture or, with the best scenario, a past left distal ureteral stone.    Interventional radiology has seen the patient-hopefully, can proceed with left percutaneous nephrostomy tube placement today.  If IR is comfortable, antegrade contrasted study can be performed at the same time.  If distal ureteral obstruction present, I'm fine with placing a double-J stent.  If there is adequate flow of contrast into the bladder, leaving nephrostomy tube for a day or 2 would be fine.   LOS: 1 day   [name] 08/25/2016, 8:55 AM

## 2016-08-25 NOTE — Progress Notes (Signed)
Patient ID: Jordan Ward, male   DOB: 28-Jul-1941, 75 y.o.   MRN: [id number] Request  received for left percutaneous nephrostomy placement on patient. He is familiar to IR service from prior left nephrostomy approx 1 year ago secondary to stone disease; he subsequently underwent PCNL and ureteral stent placement. These were removed in December 2016. Latest imaging studies were reviewed by Dr. Kathlene Cote. Renal ultrasound from yesterday showed mild left hydronephrosis, significantly improved since prior CT; Dr. Kathlene Cote recommends follow-up renal ultrasound today prior to considering left nephrostomy placement. Above discussed with patient.

## 2016-08-26 DIAGNOSIS — E876 Hypokalemia: Secondary | ICD-10-CM

## 2016-08-26 LAB — BASIC METABOLIC PANEL
ANION GAP: 6 (ref 5–15)
BUN: 30 mg/dL — ABNORMAL HIGH (ref 6–20)
CALCIUM: 8.9 mg/dL (ref 8.9–10.3)
CO2: 25 mmol/L (ref 22–32)
CREATININE: 1.82 mg/dL — AB (ref 0.61–1.24)
Chloride: 115 mmol/L — ABNORMAL HIGH (ref 101–111)
GFR calc Af Amer: 41 mL/min — ABNORMAL LOW (ref >60)
GFR, EST NON AFRICAN AMERICAN: 35 mL/min — AB (ref >60)
GLUCOSE: 118 mg/dL — AB (ref 65–99)
Potassium: 3.4 mmol/L — ABNORMAL LOW (ref 3.5–5.1)
Sodium: 146 mmol/L — ABNORMAL HIGH (ref 135–145)

## 2016-08-26 LAB — CBC
HEMATOCRIT: 37.9 % — AB (ref 39.0–52.0)
Hemoglobin: 12.3 g/dL — ABNORMAL LOW (ref 13.0–17.0)
MCH: 30.5 pg (ref 26.0–34.0)
MCHC: 32.5 g/dL (ref 30.0–36.0)
MCV: 94 fL (ref 78.0–100.0)
PLATELETS: 135 10*3/uL — AB (ref 150–400)
RBC: 4.03 MIL/uL — ABNORMAL LOW (ref 4.22–5.81)
RDW: 14.1 % (ref 11.5–15.5)
WBC: 8.7 10*3/uL (ref 4.0–10.5)

## 2016-08-26 MED ORDER — POTASSIUM CHLORIDE CRYS ER 20 MEQ PO TBCR
40.0000 meq | EXTENDED_RELEASE_TABLET | Freq: Once | ORAL | Status: AC
  Administered 2016-08-26: 40 meq via ORAL
  Filled 2016-08-26: qty 2

## 2016-08-26 MED ORDER — SENNOSIDES-DOCUSATE SODIUM 8.6-50 MG PO TABS
2.0000 | ORAL_TABLET | Freq: Every day | ORAL | Status: DC
  Administered 2016-08-26 – 2016-08-27 (×2): 2 via ORAL
  Filled 2016-08-26 (×2): qty 2

## 2016-08-26 NOTE — Progress Notes (Signed)
Pt's renal U/S yesterday looked better, and creatinine is improving. I'm fine w/ no perc tube. From [place] standpoint, pt can go home @ any point, I conveyed this to him over the phone. We will followup next week in [place] with renal U/S, catheter change.

## 2016-08-26 NOTE — Progress Notes (Addendum)
Patient ID: Jordan Ward, male   DOB: August 31, 1941, 75 y.o.   MRN: [id number]  PROGRESS NOTE    Jordan Ward  BMW:[id number] DOB: April 30, 1941 DOA: 08/23/2016  PCP: [name], MD   Brief Narrative:  75 year old man with past medical history of nephrolithiasis who presented to [facility] with acute onset of left flank pain and mild hematuria. CT abdomen and pelvis showed severe left renal obstruction, severe left hydronephrosis, Chronic bulky choledocholithiasis with distal CBD obstruction moderate to severe intra-and extrahepatic biliary ductal enlargement. EDP discussed with urology who recommended transfer to [facility] for further evaluation and possible percutaneous nephrostomy placement.  Since patient's hydronephrosis is improving, urology deferred nephrostomy tube placement for now.  Assessment & Plan:   Principal Problem:   Left kidney hydronephrosis / possible ureteral obstruction / Acute pyelonephritis / Leukocytosis  - Severe left hydronephrosis. No discrete calculus noted - Per GU, hydronephrosis improving; no plan for perc nephr placement at this time  - Urine cx with multiple species, none predominant  - Continue rocephin, can possibly change to cipro on discharge for 7 more day which would complete total of 10 day treatment for acute pyelonephritis   Active Problems: Chronic bulky choledocholithiasis with distal CBD obstruction - Also moderate to severe intra-and extrahepatic biliary ductal enlargement mildly progressed. Compared to previous studies this is a chronic finding. No right upper quadrant pain.  Hypokalemia - Due to acute infectious process - Supplemented - We will follow-up BMP tomorrow morning  Chronic kidney disease stage III - Baseline creatinine in December 2016 was about a 1.7. - Creatinine above baseline values on the admission likely secondary to hydronephrosis but it is improving - We'll follow-up creatinine level tomorrow morning and if closer to  baseline value than he would be okay for discharge  Chronic severe aortic and iliac calcified atherosclerosis, suspected high-grade chronic aortoiliac bifurcation and common iliac artery stenosis - This is a chronic finding seen on previous CT November 2016 - Asymptomatic  Essential hypertension - Continue carvedilol, verapamil   Hypothyroidism - Continue synthroid   Hypernatremia - Due to acute infection, dehydration - Continue IV fluids  - BMP tomorrow morning  Thrombocytopenia - Unclear etiology - Platelet count 135 this morning    DVT prophylaxis: SCD's bilaterally  Code Status: full code  Family Communication: no family at the bedside this am Disposition Plan: home tomorrow morning if creatinine closer to baseline values of 1.7   Consultants:   Urology   Procedures:   None   Antimicrobials:   Rocephin 08/23/2016 -->    Subjective: No overnight events.   Objective: Vitals:   08/25/16 1303 08/25/16 1721 08/25/16 2112 08/26/16 0653  BP: (!) 107/57 (!) 165/71 (!) 180/65 (!) 154/81  Pulse: (!) 50 60 63 (!) 54  Resp: 18  18 18   Temp: 98.4 F (36.9 C)  99.3 F (37.4 C) 98 F (36.7 C)  TempSrc: Oral  Oral Oral  SpO2: 92%  95% 96%  Weight:      Height:        Intake/Output Summary (Last 24 hours) at 08/26/16 1101 Last data filed at 08/26/16 1009  Gross per 24 hour  Intake           1347.5 ml  Output             1750 ml  Net           -402.5 ml   Filed Weights   08/23/16 0610  Weight: 60.8 kg (134  lb)    Examination:  General exam: Appears calm and comfortable, No distress Respiratory system: No wheezing, no rhonchi Cardiovascular system: S1 & S2 heard, RRR Gastrointestinal system: right flank pain improved, (+) BS Central nervous system: No focal neurological deficits. Extremities: Symmetric 5 x 5 power. No edema Skin: warm and dry Psychiatry: Mood & affect appropriate.   Data Reviewed: I have personally reviewed following labs and  imaging studies  CBC:  Recent Labs Lab 08/23/16 0805 08/25/16 0441 08/26/16 0455  WBC 11.2* 8.0 8.7  NEUTROABS 9.1*  --   --   HGB 14.6 12.2* 12.3*  HCT 44.2 37.5* 37.9*  MCV 93.2 94.2 94.0  PLT 143* 120* 502*   Basic Metabolic Panel:  Recent Labs Lab 08/23/16 0805 08/24/16 0510 08/25/16 0441 08/26/16 0455  NA 141 147* 146* 146*  K 3.8 3.6 3.2* 3.4*  CL 108 115* 114* 115*  CO2 25 25 25 25   GLUCOSE 107* 122* 115* 118*  BUN 40* 36* 33* 30*  CREATININE 1.69* 2.19* 1.99* 1.82*  CALCIUM 9.5 8.8* 8.7* 8.9   GFR: Estimated Creatinine Clearance: 31.1 mL/min (by C-G formula based on SCr of 1.82 mg/dL (H)). Liver Function Tests: No results for input(s): AST, ALT, ALKPHOS, BILITOT, PROT, ALBUMIN in the last 168 hours. No results for input(s): LIPASE, AMYLASE in the last 168 hours. No results for input(s): AMMONIA in the last 168 hours. Coagulation Profile:  Recent Labs Lab 08/25/16 0441  INR 1.06   Cardiac Enzymes: No results for input(s): CKTOTAL, CKMB, CKMBINDEX, TROPONINI in the last 168 hours. BNP (last 3 results) No results for input(s): PROBNP in the last 8760 hours. HbA1C: No results for input(s): HGBA1C in the last 72 hours. CBG: No results for input(s): GLUCAP in the last 168 hours. Lipid Profile: No results for input(s): CHOL, HDL, LDLCALC, TRIG, CHOLHDL, LDLDIRECT in the last 72 hours. Thyroid Function Tests: No results for input(s): TSH, T4TOTAL, FREET4, T3FREE, THYROIDAB in the last 72 hours. Anemia Panel: No results for input(s): VITAMINB12, FOLATE, FERRITIN, TIBC, IRON, RETICCTPCT in the last 72 hours. Urine analysis:    Component Value Date/Time   COLORURINE YELLOW 08/23/2016 [place] 08/23/2016 0613   LABSPEC 1.010 08/23/2016 0613   PHURINE 7.5 08/23/2016 0613   GLUCOSEU NEGATIVE 08/23/2016 0613   HGBUR LARGE (A) 08/23/2016 0613   BILIRUBINUR NEGATIVE 08/23/2016 0613   KETONESUR NEGATIVE 08/23/2016 0613   PROTEINUR TRACE  (A) 08/23/2016 0613   UROBILINOGEN 0.2 07/17/2014 1215   NITRITE POSITIVE (A) 08/23/2016 0613   LEUKOCYTESUR LARGE (A) 08/23/2016 0613   Sepsis Labs: @LABRCNTIP (procalcitonin:4,lacticidven:4)   Recent Results (from the past 240 hour(s))  Urine culture     Status: Abnormal   Collection Time: 08/23/16  7:10 AM  Result Value Ref Range Status   Specimen Description URINE, CLEAN CATCH  Final   Special Requests NONE  Final   Culture MULTIPLE SPECIES PRESENT, SUGGEST RECOLLECTION (A)  Final   Report Status 08/25/2016 FINAL  Final      Radiology Studies: US Renal Result Date: 08/24/2016 Persistent moderate left hydronephrosis. Bilateral nephrolithiasis. Atrophic right kidney.   Ct Renal Stone Study Result Date: 08/23/2016 1. Severe left renal obstruction very similar to that on the 2016 CT Abdomen and Pelvis. Severe left hydronephrosis and para-renal stranding. Thickened and irregular left ureter, especially the proximal third, with no ureteral calculus or discrete obstructing lesion. Multiple nonobstructing left intrarenal calculi. Superimposed chronic right renal atrophy and nephrolithiasis. Suprapubic catheter in place with thickened  but diminutive urinary bladder. Increased calcifications within the bladder compared to 2016, although none in proximity of the left UVJ. Overall, Cystoscopy and retrograde study of the left ureter may be most valuable at this point. 2. Chronic bulky 18 mm choledocholithiasis with distal CBD obstruction. Moderate to severe intra- and extrahepatic biliary ductal enlargement has mildly progressed. 3. Chronic severe aortic and iliac calcified atherosclerosis with suspected high-grade chronic aortoiliac bifurcation and common iliac artery stenosis. 4. Small left pleural effusion has resolved since 2016.  US Renal Result Date: 08/25/2016 Stable left-sided hydronephrosis when compared to the renal ultrasound yesterday. Overall, hydronephrosis is felt to be improved  compared to the severe hydronephrosis by CT on 08/23/2016. Electronically Signed   By: Aletta Edouard M.D.   On: 08/25/2016 09:56     Scheduled Meds: . carvedilol  6.25 mg Oral BID WC  . cefTRIAXone (ROCEPHIN)  IV  1 g Intravenous Q24H  . darifenacin  15 mg Oral Daily  . diazepam  5 mg Oral BID  . HYDROcodone-acetaminophen  1 tablet Oral BID  . levothyroxine  88 mcg Oral QAC breakfast  . senna-docusate  2 tablet Oral Daily  . sodium chloride flush  3 mL Intravenous Q12H  . verapamil  40 mg Oral BID   Continuous Infusions:   LOS: 2 days    Time spent: 25 minutes  Greater than 50% of the time spent on counseling and coordinating the care.   Leisa Lenz, MD [facility] Pager [phone]  If 7PM-7AM, please contact night-coverage [email or web address] Password [email or web address] 08/26/2016, 11:01 AM

## 2016-08-27 DIAGNOSIS — N1330 Unspecified hydronephrosis: Secondary | ICD-10-CM

## 2016-08-27 DIAGNOSIS — E870 Hyperosmolality and hypernatremia: Secondary | ICD-10-CM

## 2016-08-27 DIAGNOSIS — N179 Acute kidney failure, unspecified: Secondary | ICD-10-CM

## 2016-08-27 DIAGNOSIS — N132 Hydronephrosis with renal and ureteral calculous obstruction: Secondary | ICD-10-CM

## 2016-08-27 DIAGNOSIS — N183 Chronic kidney disease, stage 3 (moderate): Secondary | ICD-10-CM

## 2016-08-27 DIAGNOSIS — I10 Essential (primary) hypertension: Secondary | ICD-10-CM

## 2016-08-27 LAB — BASIC METABOLIC PANEL
Anion gap: 7 (ref 5–15)
BUN: 23 mg/dL — AB (ref 6–20)
CALCIUM: 8.4 mg/dL — AB (ref 8.9–10.3)
CO2: 21 mmol/L — ABNORMAL LOW (ref 22–32)
CREATININE: 1.47 mg/dL — AB (ref 0.61–1.24)
Chloride: 115 mmol/L — ABNORMAL HIGH (ref 101–111)
GFR calc Af Amer: 53 mL/min — ABNORMAL LOW (ref >60)
GFR, EST NON AFRICAN AMERICAN: 46 mL/min — AB (ref >60)
Glucose, Bld: 111 mg/dL — ABNORMAL HIGH (ref 65–99)
Potassium: 4 mmol/L (ref 3.5–5.1)
SODIUM: 143 mmol/L (ref 135–145)

## 2016-08-27 LAB — CBC
HCT: 37.6 % — ABNORMAL LOW (ref 39.0–52.0)
Hemoglobin: 12.5 g/dL — ABNORMAL LOW (ref 13.0–17.0)
MCH: 30.8 pg (ref 26.0–34.0)
MCHC: 33.2 g/dL (ref 30.0–36.0)
MCV: 92.6 fL (ref 78.0–100.0)
PLATELETS: 109 10*3/uL — AB (ref 150–400)
RBC: 4.06 MIL/uL — AB (ref 4.22–5.81)
RDW: 14 % (ref 11.5–15.5)
WBC: 7.4 10*3/uL (ref 4.0–10.5)

## 2016-08-27 MED ORDER — CIPROFLOXACIN HCL 500 MG PO TABS: 500.0000 mg | ORAL_TABLET | Freq: Two times a day (BID) | ORAL | 0 refills | Status: AC

## 2016-08-27 MED ORDER — SENNOSIDES-DOCUSATE SODIUM 8.6-50 MG PO TABS: 2.0000 | ORAL_TABLET | Freq: Every day | ORAL | 0 refills | Status: DC

## 2016-08-27 MED ORDER — CIPROFLOXACIN HCL 500 MG PO TABS
500.0000 mg | ORAL_TABLET | Freq: Two times a day (BID) | ORAL | Status: DC
  Administered 2016-08-27: 500 mg via ORAL
  Filled 2016-08-27: qty 1

## 2016-08-27 NOTE — Progress Notes (Signed)
D/C instructions reviewed w/ pt and wife. Both verbalize understanding and all questions answered. Pt wife vocalized concern re: questionable allergy to cipro but could not recall reaction. States Dr Luan Pulling will know. Dr Luan Pulling office called and he states pt is fine to take cipro. Pt and wife made aware and are reassured. Pt d/c in stable condition in w/c to wife's car by NT. Pt in possession of d/c instructions, scripts, and all personal beloingings.

## 2016-08-27 NOTE — Discharge Instructions (Signed)
Pyelonephritis, Adult Introduction Pyelonephritis is a kidney infection. The kidneys are organs that help clean your blood by moving waste out of your blood and into your pee (urine). This infection can happen quickly, or it can last for a long time. In most cases, it clears up with treatment and does not cause other problems. Follow these instructions at home: Medicines  Take over-the-counter and prescription medicines only as told by your doctor.  Take your antibiotic medicine as told by your doctor. Do not stop taking the medicine even if you start to feel better. General instructions  Drink enough fluid to keep your pee clear or pale yellow.  Avoid caffeine, tea, and carbonated drinks.  Pee (urinate) often. Avoid holding in pee for long periods of time.  Pee before and after sex.  After pooping (having a bowel movement), women should wipe from front to back. Use each tissue only once.  Keep all follow-up visits as told by your doctor. This is important. Contact a doctor if:  You do not feel better after 2 days.  Your symptoms get worse.  You have a fever. Get help right away if:  You cannot take your medicine or drink fluids as told.  You have chills and shaking.  You throw up (vomit).  You have very bad pain in your side (flank) or back.  You feel very weak or you pass out (faint). This information is not intended to replace advice given to you by your health care provider. Make sure you discuss any questions you have with your health care provider. Document Released: 10/23/2004 Document Revised: 02/21/2016 Document Reviewed: 01/08/2015  2017 [employer]

## 2016-08-27 NOTE — Discharge Summary (Signed)
Physician Discharge Summary  Jordan Ward WFU:[id number] DOB: 1941-07-05 DOA: 08/23/2016  PCP: [name], MD  Admit date: 08/23/2016 Discharge date: 08/27/2016  Admitted From: home Disposition:  home  Recommendations for Outpatient Follow-up:  1. Follow up with PCP in 1-2 weeks. Completes total 10 days of abx on 09/03/2016.Please check platelets and renal function during outpatient follow-up. 2. Follow up with urologist next week ( has appt)  Home Health:None Equipment/Devices: None  Discharge Condition: Fair CODE STATUS: Full code Diet recommendation: Heart Healthy     Discharge Diagnoses:  Principal Problem:   Hydronephrosis with obstructing calculus  Active Problems:   Acute renal failure superimposed on stage 3 chronic kidney disease ([place])   Lower urinary tract infectious disease   Chronic suprapubic catheter (HCC)   Chronic systolic congestive heart failure (HCC)   Essential hypertension   Kidney stones   Hydronephrosis of left kidney   Brief narrative/history of present illness Please refer to admission H&P for details, in brief, 75 year old male with nephrolithiasis, chronic suprapubic catheter, chronic kidney disease stage III, hypertension, hypothyroidism presented to [facility] with acute onset of left flank pain and mild hematuria. CT abdomen and pelvis showed severe left-sided hydronephrosis, chronic bulky choledocholithiasis with distal CBD obstruction (moderate to severe in dry neck separating biliary ductal enlargement). Urology was consulted and patient recommended to transfer to [facility] for possible percutaneous nephrostomy placement.   Hospital course  Principal problem Left-sided hydronephrosis with ureteral obstruction and acute pyelonephritis. -No obvious calculus noted. Evaluated by urology and since patient's hydronephrosis was improving recommended no need for percutaneous nephrostomy tube placement at this  time. -Patient was placed on empiric IV Rocephin, urine culture growing multiple species. Will switch to oral ciprofloxacin upon discharge to complete ten-day course of antibiotics. (Ciprofloxacin is listed as allergy which seems to be associated with difficulty swallowing the pill. Patient able to swallow the pill [place] applesauce).  Active problems Chronic bulky choledocholithiasis with distal CBD obstruction CT also shows moderate to severe intra-and extrahepatic biliary ductal enlargement. This compared to prior CT studies is chronic findings. Patient is asymptomatic.  Acute on chronic kidney disease stage III Worsened renal function from baseline which is secondary to hydronephrosis. Now improved back to baseline.  Hypokalemia Replenished  Essential hypertension Mildly elevated blood pressure. Follow-up as outpatient on current home blood pressure regimen.  Chronic severe aortic and iliac calcified atherosclerosis Suspicious of high-grade chronic uterine leg bifurcation and common eye iliac artery stenosis. Again this is seen on prior CT from 2016. He is asymptomatic.  Hypothyroidism Continue Synthroid.  Thrombocytopenia No kidney etiology, platelets in 100-150. Could be due to acute infection. Follow-up during outpatient visit.  Chronic suprapubic catheter Gets changed every 4 weeks, has appointment to see his urologist next week.  Hypernatremia Secondary to dehydration. Resolved fluids  Patient clinically stable to be discharged home with outpatient follow-up.  Family communication: None at bedside  Consults: Urology (Dr. Beatrix Fetters) IR    Discharge Instructions     Medication List    TAKE these medications   carvedilol 6.25 MG tablet Commonly known as:  COREG Take 1 tablet (6.25 mg total) by mouth 2 (two) times daily with a meal.   ciprofloxacin 500 MG tablet Commonly known as:  CIPRO Take 1 tablet (500 mg total) by mouth 2 (two) times daily.    diazepam 5 MG tablet Commonly known as:  VALIUM Take 1 tablet (5 mg total) by mouth 2 (two) times daily.   HYDROcodone-acetaminophen 7.5-325  MG tablet Commonly known as:  NORCO Take 1 tablet by mouth 2 (two) times daily. Max APAP 3 GM IN 24 HOURS FROM ALL SOURCES What changed:  when to take this  reasons to take this  additional instructions   levothyroxine 88 MCG tablet Commonly known as:  SYNTHROID, LEVOTHROID Take 88 mcg by mouth daily before breakfast.   potassium chloride SA 20 MEQ tablet Commonly known as:  K-DUR,KLOR-CON Take 20 mEq by mouth daily.   senna-docusate 8.6-50 MG tablet Commonly known as:  Senokot-S Take 2 tablets by mouth daily. Start taking on:  08/28/2016   solifenacin 10 MG tablet Commonly known as:  VESICARE Take 10 mg by mouth daily as needed.   verapamil 40 MG tablet Commonly known as:  CALAN Take 40 mg by mouth 2 (two) times daily.      Follow-up Information    HAWKINS,EDWARD L, MD. Schedule an appointment as soon as possible for a visit in 1 week(s).   Specialty:  Pulmonary Disease Contact information: [address] [place] [address] [phone]        [name], MD Follow up on 09/02/2016.   Specialty:  Urology Why:  has appt Contact information: [address] [place] [address] [phone]          Allergies  Allergen Reactions  . Tetracyclines & Related Anaphylaxis and Rash  . Ciprofloxacin     Trouble swallowing unknown reaction according to wife         Procedures/Studies: US Renal  Result Date: 08/25/2016 CLINICAL DATA:  Left-sided hydronephrosis and evaluation prior to possible left percutaneous nephrostomy tube placement. History of renal calculi and suprapubic bladder drainage. EXAM: RENAL / URINARY TRACT ULTRASOUND COMPLETE COMPARISON:  Renal ultrasound on 08/24/2016 and CT of the abdomen and pelvis without contrast on 08/23/2016. FINDINGS: Right Kidney:  Length: 6.8 cm. Stable echogenic and atrophic right kidney without hydronephrosis. Left Kidney: Length: 12.2 cm. Stable moderate left hydronephrosis. Small calculi are seen in the left kidney. Stable upper pole renal cysts. Bladder: The bladder is decompressed by a suprapubic catheter. IMPRESSION: Stable left-sided hydronephrosis when compared to the renal ultrasound yesterday. Overall, hydronephrosis is felt to be improved compared to the severe hydronephrosis by CT on 08/23/2016. Electronically Signed   By: Aletta Edouard M.D.   On: 08/25/2016 09:56   US Renal  Result Date: 08/24/2016 CLINICAL DATA:  Patient with left sided hydronephrosis. EXAM: RENAL / URINARY TRACT ULTRASOUND COMPLETE COMPARISON:  CT abdomen pelvis 08/23/2016. FINDINGS: Right Kidney: Length: 7.3 cm. Renal cortical thinning. Increased renal cortical echogenicity. Within the inferior pole of the right kidney there is a 4 mm stone. No hydronephrosis. Left Kidney: Length: 11.6 cm. Normal renal cortical thickness and echogenicity. Within the midpole of the left kidney there is an 8 mm stone. There is a 2.7 cm cyst off the superior pole of the kidney. Moderate hydronephrosis. Bladder: Decompressed with catheter. IMPRESSION: Persistent moderate left hydronephrosis. Bilateral nephrolithiasis. Atrophic right kidney. Electronically Signed   By: Lovey Newcomer M.D.   On: 08/24/2016 14:28   Ct Renal Stone Study  Result Date: 08/23/2016 CLINICAL DATA:  75 year old male with left flank pain since 0400 hours. In dwelling suprapubic catheter with new gross hematuria in catheter bag. Initial encounter. EXAM: CT ABDOMEN AND PELVIS WITHOUT CONTRAST TECHNIQUE: Multidetector CT imaging of the abdomen and pelvis was performed following the standard protocol without IV contrast. COMPARISON:  CT Abdomen and Pelvis 08/04/2015. FINDINGS: Lower chest:  Resolved left pleural effusion since 2016. No pericardial effusion. Centrilobular atelectasis and mild bibasilar  scarring. No upper abdominal free air. Hepatobiliary: Surgically absent gallbladder. Chronic choledocholithiasis with stable to mildly progressed intra and extrahepatic biliary ductal dilatation. An oblong 18 mm calculus remains present in the distal CBD (coronal images 35 and 36). The CBD is not dilated to 15 mm diameter versus 14 mm in 2016. Pancreas: Negative aside from the distal CBD obstruction. Spleen: Negative. Adrenals/Urinary Tract: Negative adrenal glands. Chronic right renal atrophy with right renal artery calcifications and a 6 mm nonobstructing right lower pole calculus. No right hydronephrosis or hydroureter. Severely enlarged and obstructed left kidney with confluent left para renal space stranding and/or fluid. The left ureter is irregular and dilated to the ureterovesical junction. There is evidence of left ureter urothelial thickening and an irregular contour of the proximal third of the left ureter, but no discrete mass or calculus within the left ureter. Multiple intrarenal calculi in measuring up to 5 to 7 mm are noted. Superimposed left renal artery vascular calcifications. Compared to 2016 the left hydronephrosis is stable. Superimposed exophytic upper pole renal cysts with simple fluid densitometry again noted. There is a suprapubic catheter in place, the urinary bladder is decompressed but thick walled and mildly irregular appearing. Increased calcifications within the bladder including a 2-3 mm central posterior bladder calcification and larger 14 mm irregular calcification near the right UVJ. Stomach/Bowel: Retained stool in the colon. Sequelae of left hemicolectomy with transverse to Sigmoid colon anastomosis with no adverse features. The cecum is located in the right hemipelvis. The appendix is surgically absent. There is flocculated material in the small bowel. No dilated small bowel. Chronic rectus muscle diastases with broad-based ventral abdominal protrusion of bowel and mesentery,  not a bona fide bowel hernia. Duodenum within normal limits. Vascular/Lymphatic: Extensive Aortoiliac calcified atherosclerosis noted. Chronic severe stenosis at the aortoiliac bifurcation with highly diminutive appearance of the common and distal bilateral iliac arteries. No lymphadenopathy. Reproductive: Negative. Other: No pelvic free fluid. Musculoskeletal: Osteopenia. No acute osseous abnormality identified. IMPRESSION: 1. Severe left renal obstruction very similar to that on the 2016 CT Abdomen and Pelvis. Severe left hydronephrosis and para-renal stranding. Thickened and irregular left ureter, especially the proximal third, with no ureteral calculus or discrete obstructing lesion. Multiple nonobstructing left intrarenal calculi. Superimposed chronic right renal atrophy and nephrolithiasis. Suprapubic catheter in place with thickened but diminutive urinary bladder. Increased calcifications within the bladder compared to 2016, although none in proximity of the left UVJ. Overall, Cystoscopy and retrograde study of the left ureter may be most valuable at this point. 2. Chronic bulky 18 mm choledocholithiasis with distal CBD obstruction. Moderate to severe intra- and extrahepatic biliary ductal enlargement has mildly progressed. 3. Chronic severe aortic and iliac calcified atherosclerosis with suspected high-grade chronic aortoiliac bifurcation and common iliac artery stenosis. 4. Small left pleural effusion has resolved since 2016. Electronically Signed   By: Genevie Ann M.D.   On: 08/23/2016 08:24    (Echo, Carotid, EGD, Colonoscopy, ERCP)    Subjective:   Discharge Exam: Vitals:   08/27/16 0753 08/27/16 1000  BP: (!) 153/80 (!) 187/67  Pulse: 69 61  Resp: 16 18  Temp:     Vitals:   08/26/16 2037 08/27/16 0538 08/27/16 0753 08/27/16 1000  BP: (!) 142/82 (!) 142/63 (!) 153/80 (!) 187/67  Pulse: (!) 58 (!) 57 69 61  Resp: 18 18 16 18   Temp: 98.3 F (36.8 C) 98.1 F (36.7 C)  TempSrc: Oral  Oral    SpO2: 95% 96% 97%   Weight:      Height:        General: Elderly male not in distress HEENT: No pallor, moist mucosa, supple neck Cardiovascular: RRR, S1/S2 +, no rubs, no gallops Chest CTA bilaterally, no wheezing, no rhonchi GI: Soft, NT, ND, bowel sounds +, suprapubic catheter Musculoskeletal: Warm, no edema    The results of significant diagnostics from this hospitalization (including imaging, microbiology, ancillary and laboratory) are listed below for reference.     Microbiology: Recent Results (from the past 240 hour(s))  Urine culture     Status: Abnormal   Collection Time: 08/23/16  7:10 AM  Result Value Ref Range Status   Specimen Description URINE, CLEAN CATCH  Final   Special Requests NONE  Final   Culture MULTIPLE SPECIES PRESENT, SUGGEST RECOLLECTION (A)  Final   Report Status 08/25/2016 FINAL  Final     Labs: BNP (last 3 results) No results for input(s): BNP in the last 8760 hours. Basic Metabolic Panel:  Recent Labs Lab 08/23/16 0805 08/24/16 0510 08/25/16 0441 08/26/16 0455 08/27/16 0511  NA 141 147* 146* 146* 143  K 3.8 3.6 3.2* 3.4* 4.0  CL 108 115* 114* 115* 115*  CO2 25 25 25 25  21*  GLUCOSE 107* 122* 115* 118* 111*  BUN 40* 36* 33* 30* 23*  CREATININE 1.69* 2.19* 1.99* 1.82* 1.47*  CALCIUM 9.5 8.8* 8.7* 8.9 8.4*   Liver Function Tests: No results for input(s): AST, ALT, ALKPHOS, BILITOT, PROT, ALBUMIN in the last 168 hours. No results for input(s): LIPASE, AMYLASE in the last 168 hours. No results for input(s): AMMONIA in the last 168 hours. CBC:  Recent Labs Lab 08/23/16 0805 08/25/16 0441 08/26/16 0455 08/27/16 0511  WBC 11.2* 8.0 8.7 7.4  NEUTROABS 9.1*  --   --   --   HGB 14.6 12.2* 12.3* 12.5*  HCT 44.2 37.5* 37.9* 37.6*  MCV 93.2 94.2 94.0 92.6  PLT 143* 120* 135* 109*   Cardiac Enzymes: No results for input(s): CKTOTAL, CKMB, CKMBINDEX, TROPONINI in the last 168 hours. BNP: Invalid input(s): POCBNP CBG: No  results for input(s): GLUCAP in the last 168 hours. D-Dimer No results for input(s): DDIMER in the last 72 hours. Hgb A1c No results for input(s): HGBA1C in the last 72 hours. Lipid Profile No results for input(s): CHOL, HDL, LDLCALC, TRIG, CHOLHDL, LDLDIRECT in the last 72 hours. Thyroid function studies No results for input(s): TSH, T4TOTAL, T3FREE, THYROIDAB in the last 72 hours.  Invalid input(s): FREET3 Anemia work up No results for input(s): VITAMINB12, FOLATE, FERRITIN, TIBC, IRON, RETICCTPCT in the last 72 hours. Urinalysis    Component Value Date/Time   COLORURINE YELLOW 08/23/2016 [place] 08/23/2016 0613   LABSPEC 1.010 08/23/2016 0613   PHURINE 7.5 08/23/2016 0613   GLUCOSEU NEGATIVE 08/23/2016 0613   HGBUR LARGE (A) 08/23/2016 0613   BILIRUBINUR NEGATIVE 08/23/2016 0613   KETONESUR NEGATIVE 08/23/2016 0613   PROTEINUR TRACE (A) 08/23/2016 0613   UROBILINOGEN 0.2 07/17/2014 1215   NITRITE POSITIVE (A) 08/23/2016 0613   LEUKOCYTESUR LARGE (A) 08/23/2016 0613   Sepsis Labs Invalid input(s): PROCALCITONIN,  WBC,  LACTICIDVEN Microbiology Recent Results (from the past 240 hour(s))  Urine culture     Status: Abnormal   Collection Time: 08/23/16  7:10 AM  Result Value Ref Range Status   Specimen Description URINE, CLEAN CATCH  Final   Special Requests NONE  Final  Culture MULTIPLE SPECIES PRESENT, SUGGEST RECOLLECTION (A)  Final   Report Status 08/25/2016 FINAL  Final     Time coordinating discharge: Over 30 minutes  SIGNED:   Louellen Molder, MD  [facility] 08/27/2016, 11:41 AM Pager   If 7PM-7AM, please contact night-coverage [email or web address] Password [email or web address]

## 2016-08-28 ENCOUNTER — Other Ambulatory Visit: Payer: Self-pay | Admitting: Urology

## 2016-08-28 DIAGNOSIS — N312 Flaccid neuropathic bladder, not elsewhere classified: Secondary | ICD-10-CM

## 2016-09-01 ENCOUNTER — Ambulatory Visit (HOSPITAL_COMMUNITY)
Admission: RE | Admit: 2016-09-01 | Discharge: 2016-09-01 | Disposition: A | Discharge status: Home / Self Care | Payer: Medicare Other | Source: Ambulatory Visit | Attending: Urology | Admitting: Urology

## 2016-09-01 DIAGNOSIS — N1330 Unspecified hydronephrosis: Secondary | ICD-10-CM | POA: Diagnosis not present

## 2016-09-01 DIAGNOSIS — N281 Cyst of kidney, acquired: Secondary | ICD-10-CM | POA: Insufficient documentation

## 2016-09-01 DIAGNOSIS — N312 Flaccid neuropathic bladder, not elsewhere classified: Secondary | ICD-10-CM | POA: Diagnosis not present

## 2016-09-02 ENCOUNTER — Ambulatory Visit (INDEPENDENT_AMBULATORY_CARE_PROVIDER_SITE_OTHER): Payer: Medicare Other | Admitting: Urology

## 2016-09-02 DIAGNOSIS — N319 Neuromuscular dysfunction of bladder, unspecified: Secondary | ICD-10-CM

## 2016-09-02 DIAGNOSIS — N131 Hydronephrosis with ureteral stricture, not elsewhere classified: Secondary | ICD-10-CM

## 2016-09-10 DIAGNOSIS — I10 Essential (primary) hypertension: Secondary | ICD-10-CM | POA: Diagnosis not present

## 2016-09-10 DIAGNOSIS — D696 Thrombocytopenia, unspecified: Secondary | ICD-10-CM | POA: Diagnosis not present

## 2016-09-10 DIAGNOSIS — N390 Urinary tract infection, site not specified: Secondary | ICD-10-CM | POA: Diagnosis not present

## 2016-09-10 DIAGNOSIS — N189 Chronic kidney disease, unspecified: Secondary | ICD-10-CM | POA: Diagnosis not present

## 2016-09-26 ENCOUNTER — Ambulatory Visit (HOSPITAL_COMMUNITY)
Admission: RE | Admit: 2016-09-26 | Discharge: 2016-09-26 | Disposition: A | Discharge status: Home / Self Care | Payer: Medicare Other | Source: Ambulatory Visit | Attending: Urology | Admitting: Urology

## 2016-09-26 ENCOUNTER — Other Ambulatory Visit: Payer: Self-pay | Admitting: Urology

## 2016-09-26 DIAGNOSIS — N131 Hydronephrosis with ureteral stricture, not elsewhere classified: Secondary | ICD-10-CM | POA: Diagnosis not present

## 2016-09-26 DIAGNOSIS — N1330 Unspecified hydronephrosis: Secondary | ICD-10-CM | POA: Diagnosis not present

## 2016-09-30 ENCOUNTER — Ambulatory Visit (INDEPENDENT_AMBULATORY_CARE_PROVIDER_SITE_OTHER): Payer: Medicare Other | Admitting: Urology

## 2016-09-30 DIAGNOSIS — N312 Flaccid neuropathic bladder, not elsewhere classified: Secondary | ICD-10-CM | POA: Diagnosis not present

## 2016-09-30 DIAGNOSIS — N319 Neuromuscular dysfunction of bladder, unspecified: Secondary | ICD-10-CM

## 2016-10-28 ENCOUNTER — Encounter: Payer: Self-pay | Admitting: Internal Medicine

## 2016-11-01 ENCOUNTER — Emergency Department (HOSPITAL_COMMUNITY): Payer: Medicare Other

## 2016-11-01 ENCOUNTER — Emergency Department (HOSPITAL_COMMUNITY)
Admission: EM | Admit: 2016-11-01 | Discharge: 2016-11-02 | Disposition: A | Discharge status: Home / Self Care | Payer: Medicare Other | Attending: Emergency Medicine | Admitting: Emergency Medicine

## 2016-11-01 ENCOUNTER — Encounter (HOSPITAL_COMMUNITY): Payer: Self-pay | Admitting: Emergency Medicine

## 2016-11-01 DIAGNOSIS — N189 Chronic kidney disease, unspecified: Secondary | ICD-10-CM

## 2016-11-01 DIAGNOSIS — R935 Abnormal findings on diagnostic imaging of other abdominal regions, including retroperitoneum: Secondary | ICD-10-CM | POA: Diagnosis not present

## 2016-11-01 DIAGNOSIS — T83090A Other mechanical complication of cystostomy catheter, initial encounter: Secondary | ICD-10-CM

## 2016-11-01 DIAGNOSIS — N183 Chronic kidney disease, stage 3 (moderate): Secondary | ICD-10-CM | POA: Diagnosis not present

## 2016-11-01 DIAGNOSIS — R8281 Pyuria: Secondary | ICD-10-CM

## 2016-11-01 DIAGNOSIS — I2510 Atherosclerotic heart disease of native coronary artery without angina pectoris: Secondary | ICD-10-CM | POA: Diagnosis not present

## 2016-11-01 DIAGNOSIS — Y828 Other medical devices associated with adverse incidents: Secondary | ICD-10-CM | POA: Diagnosis not present

## 2016-11-01 DIAGNOSIS — I129 Hypertensive chronic kidney disease with stage 1 through stage 4 chronic kidney disease, or unspecified chronic kidney disease: Secondary | ICD-10-CM | POA: Diagnosis not present

## 2016-11-01 DIAGNOSIS — N390 Urinary tract infection, site not specified: Secondary | ICD-10-CM | POA: Diagnosis present

## 2016-11-01 DIAGNOSIS — I130 Hypertensive heart and chronic kidney disease with heart failure and stage 1 through stage 4 chronic kidney disease, or unspecified chronic kidney disease: Secondary | ICD-10-CM | POA: Insufficient documentation

## 2016-11-01 DIAGNOSIS — T83091A Other mechanical complication of indwelling urethral catheter, initial encounter: Secondary | ICD-10-CM | POA: Diagnosis not present

## 2016-11-01 DIAGNOSIS — I5022 Chronic systolic (congestive) heart failure: Secondary | ICD-10-CM | POA: Insufficient documentation

## 2016-11-01 DIAGNOSIS — Z87891 Personal history of nicotine dependence: Secondary | ICD-10-CM | POA: Diagnosis not present

## 2016-11-01 DIAGNOSIS — Z79899 Other long term (current) drug therapy: Secondary | ICD-10-CM | POA: Insufficient documentation

## 2016-11-01 DIAGNOSIS — E039 Hypothyroidism, unspecified: Secondary | ICD-10-CM | POA: Diagnosis not present

## 2016-11-01 DIAGNOSIS — R358 Other polyuria: Secondary | ICD-10-CM | POA: Diagnosis not present

## 2016-11-01 DIAGNOSIS — N132 Hydronephrosis with renal and ureteral calculous obstruction: Secondary | ICD-10-CM | POA: Diagnosis not present

## 2016-11-01 LAB — CBC WITH DIFFERENTIAL/PLATELET
Basophils Absolute: 0.1 10*3/uL (ref 0.0–0.1)
Basophils Relative: 0 %
EOS ABS: 0.4 10*3/uL (ref 0.0–0.7)
EOS PCT: 3 %
HCT: 45 % (ref 39.0–52.0)
Hemoglobin: 15 g/dL (ref 13.0–17.0)
LYMPHS ABS: 2.2 10*3/uL (ref 0.7–4.0)
LYMPHS PCT: 16 %
MCH: 31 pg (ref 26.0–34.0)
MCHC: 33.3 g/dL (ref 30.0–36.0)
MCV: 93 fL (ref 78.0–100.0)
MONO ABS: 0.7 10*3/uL (ref 0.1–1.0)
Monocytes Relative: 5 %
Neutro Abs: 10.8 10*3/uL — ABNORMAL HIGH (ref 1.7–7.7)
Neutrophils Relative %: 76 %
PLATELETS: 208 10*3/uL (ref 150–400)
RBC: 4.84 MIL/uL (ref 4.22–5.81)
RDW: 13.7 % (ref 11.5–15.5)
WBC: 14.2 10*3/uL — ABNORMAL HIGH (ref 4.0–10.5)

## 2016-11-01 LAB — BASIC METABOLIC PANEL
Anion gap: 8 (ref 5–15)
BUN: 28 mg/dL — AB (ref 6–20)
CALCIUM: 9.3 mg/dL (ref 8.9–10.3)
CHLORIDE: 109 mmol/L (ref 101–111)
CO2: 26 mmol/L (ref 22–32)
CREATININE: 1.72 mg/dL — AB (ref 0.61–1.24)
GFR calc non Af Amer: 38 mL/min — ABNORMAL LOW (ref >60)
GFR, EST AFRICAN AMERICAN: 44 mL/min — AB (ref >60)
Glucose, Bld: 103 mg/dL — ABNORMAL HIGH (ref 65–99)
Potassium: 4 mmol/L (ref 3.5–5.1)
SODIUM: 143 mmol/L (ref 135–145)

## 2016-11-01 LAB — URINALYSIS, ROUTINE W REFLEX MICROSCOPIC
BILIRUBIN URINE: NEGATIVE
Glucose, UA: NEGATIVE mg/dL
HGB URINE DIPSTICK: NEGATIVE
KETONES UR: NEGATIVE mg/dL
NITRITE: NEGATIVE
Protein, ur: 100 mg/dL — AB
SPECIFIC GRAVITY, URINE: 1.015 (ref 1.005–1.030)
pH: 9 — ABNORMAL HIGH (ref 5.0–8.0)

## 2016-11-01 LAB — LACTIC ACID, PLASMA: LACTIC ACID, VENOUS: 1.2 mmol/L (ref 0.5–1.9)

## 2016-11-01 MED ORDER — DEXTROSE 5 % IV SOLN
1.0000 g | Freq: Once | INTRAVENOUS | Status: AC
  Administered 2016-11-01: 1 g via INTRAVENOUS
  Filled 2016-11-01: qty 10

## 2016-11-01 NOTE — ED Triage Notes (Signed)
Pt reports having a suprapubic catheter that has had  decreased urine out put today. Pt reports leaking urine around the catheter and lower back pain. Pt also c/o a fowl odor to his urine. Pt ambulated with his walker in to triage.

## 2016-11-01 NOTE — ED Notes (Signed)
Pt escorted to room and informed that physician would see him shortly- spouse relied, "in 51 hours"- Dr Tonye Becket in to assess

## 2016-11-01 NOTE — Discharge Instructions (Signed)
Take your usual prescriptions as previously directed.  Call your regular Urologist on Monday to confirm your previously scheduled follow up appointment on Tuesday. Return to the Emergency Department immediately sooner if worsening.

## 2016-11-01 NOTE — ED Provider Notes (Signed)
[facility] DEPT Provider Note   CSN: [id number] Arrival date & time: 11/01/16  1847     History   Chief Complaint Chief Complaint  Patient presents with  . catheter problems    HPI Jordan Ward is a 76 y.o. male.     Pt was seen at 1900. Per pt and his wife, c/o gradual onset and persistence of constant "decreased urine output" since yesterday. Pt states his urine has been "foul smelling" for the past several days. Has been associated with left sided flank pain. Endorses hx of same 3 months ago, dx left sided hydronephrosis with obstructing calculus, acute on chronic kidney disease, and UTI. Denies abd pain, no N/V/D, no hematuria, no testicular pain/swelling, no CP/SOB, no fevers.    Past Medical History:  Diagnosis Date  . Anemia   . Arthritis   . Back pain, chronic   . Bilateral carotid bruits   . C. difficile colitis 09/2011  . CAD (coronary artery disease)   . Carotid artery stenosis   . Cerebrovascular disease   . Colon polyps   . Dyslipidemia   . Dysphagia 10/07/2011  . Encephalopathy   . Gait disorder   . HA (headache)   . High grade dysplasia in colonic adenoma 09/2005  . HTN (hypertension), malignant 10/06/2011  . Hypernatremia   . Hypokalemia   . Hypothyroidism 10/08/2011  . Insomnia   . Junctional rhythm   . Kidney stones   . MS (multiple sclerosis) ([place])   . Neuromuscular disorder ([place])   . OSA (obstructive sleep apnea)   . Paroxysmal atrial tachycardia ([place])   . Peripheral vascular disease ([place])   . Pneumonia 4 yrs ago  . Pulmonary fibrosis ([place]) 10/06/2011  . Pulmonary nodule 10/08/2011  . PVD (peripheral vascular disease) ([place])   . Sacral ulcer ([place])   . Sleep apnea   . TIA (transient ischemic attack)   . Tremors of nervous system 10/08/2011  . Tremors of nervous system   . Urinary tract infection     Patient Active Problem List   Diagnosis Date Noted  . CKD (chronic kidney disease), stage III 08/23/2016  . Hydronephrosis of left kidney  08/23/2016  . Kidney stones 09/13/2015  . Essential hypertension   . Pressure ulcer 08/04/2015  . Acute renal failure superimposed on stage 3 chronic kidney disease ([place]) 08/04/2015  . Chronic systolic congestive heart failure ([place]) 08/04/2015  . Hydronephrosis with obstructing calculus 08/04/2015  . Occult blood positive stool 09/05/2014  . Rash and nonspecific skin eruption 08/25/2014  . Edema 08/19/2014  . Subacute confusional state 08/06/2014  . Dilated cardiomyopathy ([place]) 07/21/2014  . Sepsis ([place]) 07/17/2014  . Severe sepsis ([place]) 07/17/2014  . Headache 03/13/2014  . CVA (cerebral infarction) 03/13/2014  . Abnormality of gait 03/13/2014  . Tremors of nervous system 10/08/2011  . Hypothyroidism 10/08/2011  . Pulmonary nodule 10/08/2011  . Dysphagia 10/07/2011  . HTN (hypertension), malignant 10/06/2011  . Chronic suprapubic catheter ([place]) 10/06/2011  . Pulmonary fibrosis ([place]) 10/06/2011  . Lower urinary tract infectious disease 10/03/2011  . PNA (pneumonia) probable 10/02/2011  . Junctional rhythm 10/02/2011  . Multiple sclerosis ([place]) 10/02/2011  . Anemia 06/18/2011  . Chronic diarrhea 06/17/2011  . Hx of adenomatous colonic polyps 06/17/2011  . High grade dysplasia in colonic adenoma 09/29/2005    Past Surgical History:  Procedure Laterality Date  . APPENDECTOMY  09/2005   at time of left hemicolectomy  . BACK SURGERY  1976/1979   lower  .  CHOLECYSTECTOMY     Dr. Tamala Julian  . COLON SURGERY  09/2005   [place]le[place]man: four tubular adenomas, large adenomatous polyp with HIGH GRADE dysplasia  . COLONOSCOPY  11/2004   Dr. Sharol Roussel sessile polyp splenic flexure, 81mm sessile polyp desc colon, tubulovillous adenoma (bx not removed)  . COLONOSCOPY  01/2005   poor prep, polyp could not be found  . COLONOSCOPY  05/2005   with EMR, polypectomy Dr. Olegario Messier, bx showed high grade dysplasia, partially resected  . COLONOSCOPY  09/2005   Dr. Arsenio Loader, [place] ink tattooing, four  villous colon polyp (3 had been missed on previous colonoscopies due to limitations of procedures  . COLONOSCOPY  09/2006   normal TI, no polyps  . COLONOSCOPY  10/2007   Dr. Imogene Burn distal mammillations, benign bx, normal TI, random bx neg for microscopic colitis  . INGUINAL HERNIA REPAIR  1971   bilateral  . KIDNEY STONE SURGERY  09/13/2015  . NEPHROLITHOTOMY Left 09/13/2015   Procedure: LEFT PERCUTANEOUS NEPHROLITHOTOMY ;  Surgeon: Franchot Gallo, MD;  Location: [facility];  Service: Urology;  Laterality: Left;  . SUPRAPUBIC CATHETER INSERTION         Home Medications    Prior to Admission medications   Medication Sig Start Date End Date Taking? Authorizing Provider  carvedilol (COREG) 6.25 MG tablet Take 1 tablet (6.25 mg total) by mouth 2 (two) times daily with a meal. 07/26/14   Verlee Monte, MD  diazepam (VALIUM) 5 MG tablet Take 1 tablet (5 mg total) by mouth 2 (two) times daily. 08/19/16   Ward Givens, NP  HYDROcodone-acetaminophen (NORCO) 7.5-325 MG per tablet Take 1 tablet by mouth 2 (two) times daily. Max APAP 3 GM IN 24 HOURS FROM ALL SOURCES Patient taking differently: Take 1 tablet by mouth every 6 (six) hours as needed for moderate pain. Max APAP 3 GM IN 24 HOURS FROM ALL SOURCES 08/22/14   Blanchie Serve, MD  levothyroxine (SYNTHROID, LEVOTHROID) 88 MCG tablet Take 88 mcg by mouth daily before breakfast.    Historical Provider, MD  potassium chloride SA (K-DUR,KLOR-CON) 20 MEQ tablet Take 20 mEq by mouth daily.    Rexene Alberts, MD  senna-docusate (SENOKOT-S) 8.6-50 MG tablet Take 2 tablets by mouth daily. 08/28/16   Nishant Dhungel, MD  solifenacin (VESICARE) 10 MG tablet Take 10 mg by mouth daily as needed.    Historical Provider, MD  verapamil (CALAN) 40 MG tablet Take 40 mg by mouth 2 (two) times daily.    Historical Provider, MD    Family History Family History  Problem Relation Age of Onset  . Cirrhosis Brother     etoh  . Stroke Mother 40  . Coronary  artery disease Father 42  . Heart attack Brother   . Cancer Sister   . Multiple sclerosis    . Colon cancer Neg Hx     Social History Social History  Substance Use Topics  . Smoking status: Former Smoker    Types: Cigarettes  . Smokeless tobacco: Never Used     Comment: quit remote  . Alcohol use No     Allergies   Tetracyclines & related and Ciprofloxacin   Review of Systems Review of Systems ROS: Statement: All systems negative except as marked or noted in the HPI; Constitutional: Negative for fever and chills. ; ; Eyes: Negative for eye pain, redness and discharge. ; ; ENMT: Negative for ear pain, hoarseness, nasal congestion, sinus pressure and sore throat. ; ; Cardiovascular: Negative for chest  pain, palpitations, diaphoresis, dyspnea and peripheral edema. ; ; Respiratory: Negative for cough, wheezing and stridor. ; ; Gastrointestinal: Negative for nausea, vomiting, diarrhea, abdominal pain, blood in stool, hematemesis, jaundice and rectal bleeding. . ; ; Genitourinary: +"foul smelling urine," flank pain. Negative for dysuria and hematuria. ; ; Genital:  No penile drainage or rash, no testicular pain or swelling, no scrotal rash or swelling. ;; Musculoskeletal: Negative for back pain and neck pain. Negative for swelling and trauma.; ; Skin: Negative for pruritus, rash, abrasions, blisters, bruising and skin lesion.; ; Neuro: Negative for headache, lightheadedness and neck stiffness. Negative for weakness, altered level of consciousness, altered mental status, extremity weakness, paresthesias, involuntary movement, seizure and syncope.      Physical Exam Updated Vital Signs BP 181/89 (BP Location: Right Arm)   Pulse 73   Temp 98.4 F (36.9 C) (Oral)   Resp 20   Ht 5\' 6"  (1.676 m)   Wt 126 lb (57.2 kg)   SpO2 100%   BMI 20.34 kg/m   Patient Vitals for the past 24 hrs:  BP Temp Temp src Pulse Resp SpO2 Height Weight  11/01/16 1856 - - - - - - 5\' 6"  (1.676 m) 126 lb (57.2  kg)  11/01/16 1855 181/89 98.4 F (36.9 C) Oral 73 20 100 % - -    Physical Exam 1905: Physical examination:  Nursing notes reviewed; Vital signs and O2 SAT reviewed;  Constitutional: Well developed, Well nourished, Well hydrated, In no acute distress; Head:  Normocephalic, atraumatic; Eyes: EOMI, PERRL, No scleral icterus; ENMT: Mouth and pharynx normal, Mucous membranes moist; Neck: Supple, Full range of motion, No lymphadenopathy; Cardiovascular: Regular rate and rhythm, No gallop; Respiratory: Breath sounds clear & equal bilaterally, No wheezes.  Speaking full sentences with ease, Normal respiratory effort/excursion; Chest: Nontender, Movement normal; Abdomen: Soft, Nontender, Nondistended, Normal bowel sounds; Genitourinary: No CVA tenderness. +foul smelling urine leaking around suprapubic catheter.; Extremities: Pulses normal, No tenderness, No edema, No calf edema or asymmetry.; Neuro: AA&Ox3, Major CN grossly intact.  Speech clear. No gross focal motor or sensory deficits in extremities. Climbs on and off stretcher easily by himself. Gait steady with walker..; Skin: Color normal, Warm, Dry.   ED Treatments / Results  Labs (all labs ordered are listed, but only abnormal results are displayed)   EKG  EKG Interpretation None       Radiology   Procedures SUPRAPUBIC TUBE PLACEMENT Date/Time: 11/01/2016 9:59 PM Performed by: Francine Graven Authorized by: Francine Graven   Consent:    Consent obtained:  Verbal   Consent given by:  Patient and spouse   Risks discussed:  Bleeding, infection and pain   Alternatives discussed:  No treatment, delayed treatment, alternative treatment, observation and referral Sedation:    Sedation type: None. Anesthesia (see MAR for exact dosages):    Anesthesia method:  None Procedure details:    Complexity:  Simple   Catheter type:  Foley   Catheter size:  24 Fr   Ultrasound guidance: no     Number of attempts:  1   Urine  characteristics:  Foul-smelling and mildly cloudy Post-procedure details:    Patient tolerance of procedure:  Tolerated well, no immediate complications       Medications Ordered in ED Medications - No data to display   Initial Impression / Assessment and Plan / ED Course  I have reviewed the triage vital signs and the nursing notes.  Pertinent labs & imaging results that were available during my  care of the patient were reviewed by me and considered in my medical decision making (see chart for details).  MDM Reviewed: previous chart, nursing note and vitals Reviewed previous: labs and CT scan Interpretation: labs and CT scan    Results for orders placed or performed during the hospital encounter of 11/01/16  Urinalysis, Routine w reflex microscopic  Result Value Ref Range   Color, Urine YELLOW YELLOW   APPearance TURBID (A) CLEAR   Specific Gravity, Urine 1.015 1.005 - 1.030   pH 9.0 (H) 5.0 - 8.0   Glucose, UA NEGATIVE NEGATIVE mg/dL   Hgb urine dipstick NEGATIVE NEGATIVE   Bilirubin Urine NEGATIVE NEGATIVE   Ketones, ur NEGATIVE NEGATIVE mg/dL   Protein, ur 100 (A) NEGATIVE mg/dL   Nitrite NEGATIVE NEGATIVE   Leukocytes, UA SMALL (A) NEGATIVE   RBC / HPF 6-30 0 - 5 RBC/hpf   WBC, UA TOO NUMEROUS TO COUNT 0 - 5 WBC/hpf   Bacteria, UA FEW (A) NONE SEEN   Mucous PRESENT   Basic metabolic panel  Result Value Ref Range   Sodium 143 135 - 145 mmol/L   Potassium 4.0 3.5 - 5.1 mmol/L   Chloride 109 101 - 111 mmol/L   CO2 26 22 - 32 mmol/L   Glucose, Bld 103 (H) 65 - 99 mg/dL   BUN 28 (H) 6 - 20 mg/dL   Creatinine, Ser 1.72 (H) 0.61 - 1.24 mg/dL   Calcium 9.3 8.9 - 10.3 mg/dL   GFR calc non Af Amer 38 (L) >60 mL/min   GFR calc Af Amer 44 (L) >60 mL/min   Anion gap 8 5 - 15  Lactic acid, plasma  Result Value Ref Range   Lactic Acid, Venous 1.2 0.5 - 1.9 mmol/L  CBC with Differential  Result Value Ref Range   WBC 14.2 (H) 4.0 - 10.5 K/uL   RBC 4.84 4.22 - 5.81  MIL/uL   Hemoglobin 15.0 13.0 - 17.0 g/dL   HCT 45.0 39.0 - 52.0 %   MCV 93.0 78.0 - 100.0 fL   MCH 31.0 26.0 - 34.0 pg   MCHC 33.3 30.0 - 36.0 g/dL   RDW 13.7 11.5 - 15.5 %   Platelets 208 150 - 400 K/uL   Neutrophils Relative % 76 %   Neutro Abs 10.8 (H) 1.7 - 7.7 K/uL   Lymphocytes Relative 16 %   Lymphs Abs 2.2 0.7 - 4.0 K/uL   Monocytes Relative 5 %   Monocytes Absolute 0.7 0.1 - 1.0 K/uL   Eosinophils Relative 3 %   Eosinophils Absolute 0.4 0.0 - 0.7 K/uL   Basophils Relative 0 %   Basophils Absolute 0.1 0.0 - 0.1 K/uL   Ct Renal Stone Study Result Date: 11/01/2016 CLINICAL DATA:  Suprapubic catheter with decreased urine output today. Patient reports leaking urine around catheter and lower back pain. Patient also complains of a foul odor to the urine. Nurse unable to flush catheter in the emergency room. EXAM: CT ABDOMEN AND PELVIS WITHOUT CONTRAST TECHNIQUE: Multidetector CT imaging of the abdomen and pelvis was performed following the standard protocol without IV contrast. COMPARISON:  CT abdomen dated 08/23/2016. FINDINGS: Lower chest: Chronic bibasilar interstitial fibrosis and/or dependent atelectasis. Hepatobiliary: Status post cholecystectomy. Again noted is hyperdense material within the distal common bile duct, measuring approximately 14 mm greatest dimension, compatible with previously described chronic choledocholithiasis. The associated common bile duct dilatation is stable, also measuring 14 mm diameter. The mild intrahepatic bile duct dilatation is stable. Pancreas:  Partially infiltrated with fat but otherwise unremarkable. Spleen: Normal in size without focal abnormality. Adrenals/Urinary Tract: Stable chronic left-sided hydronephrosis and hydroureter, moderate to severe in degree, with slightly decreased perinephric edema. Nonobstructing renal calculi again noted bilaterally. No right-sided hydronephrosis or hydroureter. Left renal cysts are stable. Suprapubic catheter in  place, appropriately positioned within the bladder and appears to be in a similar position compared to the previous CT. The superficial soft tissues about the catheter are unremarkable. Small layering stones and debris within the bladder again noted. Stomach/Bowel: Bowel is normal in caliber. Surgical bowel anastomosis noted within the left abdomen, presumably indicating previous partial colectomy. No obstruction, inflammation or other surgical complicating feature at the anastomosis. Stomach appears normal. Surgical changes in the right lower quadrant are compatible with prior appendectomy. Vascular/Lymphatic: Aortic atherosclerosis. Chronic severe stenosis again noted at the aorta iliac bifurcation. No enlarged lymph nodes seen in the abdomen or pelvis. Reproductive: Prostate is unremarkable. Other: No free fluid or abscess collection. No free intraperitoneal air. Musculoskeletal: Degenerative changes of the thoracolumbar spine, mild to moderate in degree. No acute or suspicious osseous finding. Midline diastases recti. Superficial soft tissues are otherwise unremarkable. IMPRESSION: 1. Suprapubic catheter appears well positioned within the bladder, in a stable position compared to the earlier CT of 08/23/2016. The superficial soft tissues about the catheter are unremarkable. 2. Bladder is mildly distended. Again noted is a small amount of layering debris and small stones within the bladder. No obvious bladder [place] thickening. 3. Continued severe left-sided hydronephrosis and hydroureter, not significantly changed compared to multiple prior exams. No obstructing renal or ureteral calculi identified on today's exam. 4. Chronic stone (or stones) within the common bile duct. Common bile duct dilatation, measuring 14 mm diameter, is stable compared to the most recent study of 08/23/2016, slightly increased compared to the earlier CT of 08/04/2015. 5. Bilateral nephrolithiasis. 6. Aortic atherosclerosis. Again noted is  chronic severe stenosis at the aortic bifurcation. Electronically Signed   By: Franki Cabot M.D.   On: 11/01/2016 20:26    Results for JOEVON, HOLLIMAN (MRN [id number]) as of 11/01/2016 22:41  Ref. Range 08/24/2016 05:10 08/25/2016 04:41 08/26/2016 04:55 08/27/2016 05:11 11/01/2016 20:24  BUN Latest Ref Range: 6 - 20 mg/dL 36 (H) 33 (H) 30 (H) 23 (H) 28 (H)  Creatinine Latest Ref Range: 0.61 - 1.24 mg/dL 2.19 (H) 1.99 (H) 1.82 (H) 1.47 (H) 1.72 (H)     2200:  CT scan: hydronephrosis without change from previous, no ureteral calculi seen. Unable to irrigate suprapubic catheter, +leaking around catheter.  Suprapubic catheter removed intact and replaced with same size pt currently has: +thick sediment surrounding distal tip and port of old catheter. New catheter changed with immediate return of urine; pt stated he "felt better now."    2245:  +pyuria, UC pending; will dose IV rocephin. BUN/Cr near baseline. Pt wants to go home now. VS remain stable, remains afebrile. T/C to Uro Dr. Jeffie Pollock, case discussed, including:  HPI, pertinent PM/SHx, VS/PE, dx testing, ED course and treatment:  Agrees with ED care, states hydronephrosis and mild BUN/Cr elevation likely d/t obstructed urine catheter and should improve now that urine is free flowing through foley, since pt is not febrile hold prescription abx for now/wait for UC results before giving any further abx to avoid resistant bacteria (inform pt and family), also make pt/family aware pt may need catheter change q2 weeks vs q4 weeks given amount of sediment on end of tubing, f/u office. Dx and testing,  as well as d/w Uro MD, d/w pt and family.  Questions answered.  Verb understanding, agreeable to d/c home with outpt f/u on Tuesday with Uro MD as previously scheduled.       Final Clinical Impressions(s) / ED Diagnoses   Final diagnoses:  None    New Prescriptions New Prescriptions   No medications on file      Francine Graven, DO 11/05/16 0228

## 2016-11-01 NOTE — ED Notes (Signed)
RN attempted to flush catheter. Met resistance. Unable to flush catheter.

## 2016-11-03 LAB — URINE CULTURE

## 2016-11-04 ENCOUNTER — Ambulatory Visit (INDEPENDENT_AMBULATORY_CARE_PROVIDER_SITE_OTHER): Payer: Medicare Other | Admitting: Urology

## 2016-11-04 DIAGNOSIS — N131 Hydronephrosis with ureteral stricture, not elsewhere classified: Secondary | ICD-10-CM

## 2016-11-04 DIAGNOSIS — N312 Flaccid neuropathic bladder, not elsewhere classified: Secondary | ICD-10-CM

## 2016-11-05 ENCOUNTER — Other Ambulatory Visit: Payer: Self-pay | Admitting: Urology

## 2016-11-05 DIAGNOSIS — N131 Hydronephrosis with ureteral stricture, not elsewhere classified: Secondary | ICD-10-CM

## 2016-11-20 ENCOUNTER — Ambulatory Visit (HOSPITAL_COMMUNITY)
Admission: RE | Admit: 2016-11-20 | Discharge: 2016-11-20 | Disposition: A | Discharge status: Home / Self Care | Payer: Medicare Other | Source: Ambulatory Visit | Attending: Urology | Admitting: Urology

## 2016-11-20 DIAGNOSIS — N1330 Unspecified hydronephrosis: Secondary | ICD-10-CM | POA: Diagnosis not present

## 2016-11-20 DIAGNOSIS — N261 Atrophy of kidney (terminal): Secondary | ICD-10-CM | POA: Diagnosis not present

## 2016-11-20 DIAGNOSIS — N281 Cyst of kidney, acquired: Secondary | ICD-10-CM | POA: Diagnosis not present

## 2016-11-20 DIAGNOSIS — N131 Hydronephrosis with ureteral stricture, not elsewhere classified: Secondary | ICD-10-CM | POA: Insufficient documentation

## 2016-11-28 ENCOUNTER — Ambulatory Visit (INDEPENDENT_AMBULATORY_CARE_PROVIDER_SITE_OTHER): Payer: Medicare Other | Admitting: Urology

## 2016-11-28 ENCOUNTER — Other Ambulatory Visit: Payer: Self-pay | Admitting: Urology

## 2016-11-28 DIAGNOSIS — N131 Hydronephrosis with ureteral stricture, not elsewhere classified: Secondary | ICD-10-CM

## 2016-12-04 ENCOUNTER — Encounter (HOSPITAL_COMMUNITY): Payer: No Typology Code available for payment source

## 2016-12-11 ENCOUNTER — Encounter (HOSPITAL_COMMUNITY): Payer: Self-pay

## 2016-12-11 ENCOUNTER — Encounter (HOSPITAL_COMMUNITY)
Admission: RE | Admit: 2016-12-11 | Discharge: 2016-12-11 | Disposition: A | Discharge status: Home / Self Care | Payer: Medicare Other | Source: Ambulatory Visit | Attending: Urology | Admitting: Urology

## 2016-12-11 DIAGNOSIS — N132 Hydronephrosis with renal and ureteral calculous obstruction: Secondary | ICD-10-CM | POA: Diagnosis not present

## 2016-12-11 DIAGNOSIS — N131 Hydronephrosis with ureteral stricture, not elsewhere classified: Secondary | ICD-10-CM | POA: Diagnosis not present

## 2016-12-11 MED ORDER — FUROSEMIDE 10 MG/ML IJ SOLN
60.0000 mg | Freq: Once | INTRAMUSCULAR | Status: AC
  Administered 2016-12-11: 60 mg via INTRAVENOUS

## 2016-12-11 MED ORDER — TECHNETIUM TC 99M MERTIATIDE
5.0000 | Freq: Once | INTRAVENOUS | Status: AC | PRN
  Administered 2016-12-11: 5.5 via INTRAVENOUS

## 2016-12-11 MED ORDER — FUROSEMIDE 10 MG/ML IJ SOLN
INTRAMUSCULAR | Status: AC
  Administered 2016-12-11: 60 mg via INTRAVENOUS
  Filled 2016-12-11: qty 6

## 2016-12-19 ENCOUNTER — Ambulatory Visit (INDEPENDENT_AMBULATORY_CARE_PROVIDER_SITE_OTHER): Payer: Medicare Other | Admitting: Urology

## 2016-12-19 DIAGNOSIS — N312 Flaccid neuropathic bladder, not elsewhere classified: Secondary | ICD-10-CM

## 2017-01-08 DIAGNOSIS — G35 Multiple sclerosis: Secondary | ICD-10-CM | POA: Diagnosis not present

## 2017-01-08 DIAGNOSIS — G4733 Obstructive sleep apnea (adult) (pediatric): Secondary | ICD-10-CM | POA: Diagnosis not present

## 2017-01-08 DIAGNOSIS — J8410 Pulmonary fibrosis, unspecified: Secondary | ICD-10-CM | POA: Diagnosis not present

## 2017-01-08 DIAGNOSIS — I10 Essential (primary) hypertension: Secondary | ICD-10-CM | POA: Diagnosis not present

## 2017-01-09 ENCOUNTER — Ambulatory Visit (INDEPENDENT_AMBULATORY_CARE_PROVIDER_SITE_OTHER): Payer: Medicare Other | Admitting: Urology

## 2017-01-09 DIAGNOSIS — N312 Flaccid neuropathic bladder, not elsewhere classified: Secondary | ICD-10-CM

## 2017-01-27 ENCOUNTER — Ambulatory Visit (INDEPENDENT_AMBULATORY_CARE_PROVIDER_SITE_OTHER): Payer: Medicare Other | Admitting: Urology

## 2017-01-27 DIAGNOSIS — N312 Flaccid neuropathic bladder, not elsewhere classified: Secondary | ICD-10-CM

## 2017-02-20 ENCOUNTER — Ambulatory Visit (INDEPENDENT_AMBULATORY_CARE_PROVIDER_SITE_OTHER): Payer: Medicare Other | Admitting: Urology

## 2017-02-20 DIAGNOSIS — N312 Flaccid neuropathic bladder, not elsewhere classified: Secondary | ICD-10-CM | POA: Diagnosis not present

## 2017-03-12 ENCOUNTER — Other Ambulatory Visit: Payer: Self-pay | Admitting: Adult Health

## 2017-03-12 NOTE — Telephone Encounter (Signed)
Patient needs to make an appointment.

## 2017-03-13 NOTE — Telephone Encounter (Signed)
Fax confirmation to [facility] received. [phone]. sy

## 2017-03-14 ENCOUNTER — Other Ambulatory Visit: Payer: Self-pay | Admitting: Adult Health

## 2017-03-14 NOTE — Telephone Encounter (Signed)
Received message from answering service that patient called "Valium prescription not sent to [facility]".    Unclear whether sent as message stating it was faxed 03/13/17 but also there is an unsigned order.  I contacted the [facility] and ordered diazepam 5 mg #60 with 4 refills.  I also signed order as note would not close with open order  Patient informed that script called in

## 2017-03-17 ENCOUNTER — Ambulatory Visit (INDEPENDENT_AMBULATORY_CARE_PROVIDER_SITE_OTHER): Payer: Medicare Other | Admitting: Urology

## 2017-03-17 DIAGNOSIS — R338 Other retention of urine: Secondary | ICD-10-CM | POA: Diagnosis not present

## 2017-04-07 ENCOUNTER — Ambulatory Visit (INDEPENDENT_AMBULATORY_CARE_PROVIDER_SITE_OTHER): Payer: Medicare Other | Admitting: Urology

## 2017-04-07 DIAGNOSIS — N312 Flaccid neuropathic bladder, not elsewhere classified: Secondary | ICD-10-CM | POA: Diagnosis not present

## 2017-04-29 ENCOUNTER — Ambulatory Visit (INDEPENDENT_AMBULATORY_CARE_PROVIDER_SITE_OTHER): Payer: Medicare Other | Admitting: Urology

## 2017-04-29 DIAGNOSIS — N312 Flaccid neuropathic bladder, not elsewhere classified: Secondary | ICD-10-CM | POA: Diagnosis not present

## 2017-04-29 DIAGNOSIS — G35 Multiple sclerosis: Secondary | ICD-10-CM | POA: Diagnosis not present

## 2017-04-29 DIAGNOSIS — K110 Atrophy of salivary gland: Secondary | ICD-10-CM | POA: Diagnosis not present

## 2017-04-29 DIAGNOSIS — E039 Hypothyroidism, unspecified: Secondary | ICD-10-CM | POA: Diagnosis not present

## 2017-04-29 DIAGNOSIS — I10 Essential (primary) hypertension: Secondary | ICD-10-CM | POA: Diagnosis not present

## 2017-05-06 ENCOUNTER — Telehealth: Payer: Self-pay | Admitting: *Deleted

## 2017-05-06 NOTE — Telephone Encounter (Signed)
Spoke with patient and scheduled his one year follow up in Nov. He stated he may have to cancel if he cannot find transportation. This RN advised he call if he has to reschedule and to try and arrange transpo now. Also advised he'll get a reminder call. Advised him this RN wanted to get him on the schedule before it filled.  Patient wrote down FU date/time, verbalized understanding.

## 2017-05-19 ENCOUNTER — Ambulatory Visit (INDEPENDENT_AMBULATORY_CARE_PROVIDER_SITE_OTHER): Payer: Medicare Other | Admitting: Urology

## 2017-05-19 DIAGNOSIS — N312 Flaccid neuropathic bladder, not elsewhere classified: Secondary | ICD-10-CM | POA: Diagnosis not present

## 2017-05-25 ENCOUNTER — Encounter (HOSPITAL_COMMUNITY): Payer: Self-pay | Admitting: Emergency Medicine

## 2017-05-25 ENCOUNTER — Inpatient Hospital Stay (HOSPITAL_COMMUNITY)
Admission: EM | Admit: 2017-05-25 | Discharge: 2017-05-29 | DRG: 690 | Disposition: A | Discharge status: Home / Self Care | Payer: Medicare Other | Attending: Urology | Admitting: Urology

## 2017-05-25 ENCOUNTER — Emergency Department (HOSPITAL_COMMUNITY): Payer: Medicare Other

## 2017-05-25 DIAGNOSIS — M1991 Primary osteoarthritis, unspecified site: Secondary | ICD-10-CM | POA: Diagnosis present

## 2017-05-25 DIAGNOSIS — N179 Acute kidney failure, unspecified: Secondary | ICD-10-CM | POA: Diagnosis present

## 2017-05-25 DIAGNOSIS — G8929 Other chronic pain: Secondary | ICD-10-CM | POA: Diagnosis present

## 2017-05-25 DIAGNOSIS — N200 Calculus of kidney: Secondary | ICD-10-CM

## 2017-05-25 DIAGNOSIS — L8990 Pressure ulcer of unspecified site, unspecified stage: Secondary | ICD-10-CM | POA: Insufficient documentation

## 2017-05-25 DIAGNOSIS — N132 Hydronephrosis with renal and ureteral calculous obstruction: Secondary | ICD-10-CM | POA: Diagnosis present

## 2017-05-25 DIAGNOSIS — N139 Obstructive and reflux uropathy, unspecified: Secondary | ICD-10-CM | POA: Diagnosis present

## 2017-05-25 DIAGNOSIS — Z8673 Personal history of transient ischemic attack (TIA), and cerebral infarction without residual deficits: Secondary | ICD-10-CM

## 2017-05-25 DIAGNOSIS — N131 Hydronephrosis with ureteral stricture, not elsewhere classified: Secondary | ICD-10-CM | POA: Diagnosis present

## 2017-05-25 DIAGNOSIS — Z86010 Personal history of colonic polyps: Secondary | ICD-10-CM

## 2017-05-25 DIAGNOSIS — E785 Hyperlipidemia, unspecified: Secondary | ICD-10-CM | POA: Diagnosis present

## 2017-05-25 DIAGNOSIS — M545 Low back pain: Secondary | ICD-10-CM | POA: Diagnosis not present

## 2017-05-25 DIAGNOSIS — I129 Hypertensive chronic kidney disease with stage 1 through stage 4 chronic kidney disease, or unspecified chronic kidney disease: Secondary | ICD-10-CM | POA: Diagnosis not present

## 2017-05-25 DIAGNOSIS — Z87442 Personal history of urinary calculi: Secondary | ICD-10-CM

## 2017-05-25 DIAGNOSIS — A419 Sepsis, unspecified organism: Secondary | ICD-10-CM | POA: Diagnosis not present

## 2017-05-25 DIAGNOSIS — N183 Chronic kidney disease, stage 3 (moderate): Secondary | ICD-10-CM | POA: Diagnosis present

## 2017-05-25 DIAGNOSIS — Z881 Allergy status to other antibiotic agents status: Secondary | ICD-10-CM

## 2017-05-25 DIAGNOSIS — J8410 Pulmonary fibrosis, unspecified: Secondary | ICD-10-CM | POA: Diagnosis present

## 2017-05-25 DIAGNOSIS — G4733 Obstructive sleep apnea (adult) (pediatric): Secondary | ICD-10-CM | POA: Diagnosis present

## 2017-05-25 DIAGNOSIS — N319 Neuromuscular dysfunction of bladder, unspecified: Secondary | ICD-10-CM | POA: Diagnosis not present

## 2017-05-25 DIAGNOSIS — G35 Multiple sclerosis: Secondary | ICD-10-CM | POA: Diagnosis not present

## 2017-05-25 DIAGNOSIS — N261 Atrophy of kidney (terminal): Secondary | ICD-10-CM | POA: Diagnosis present

## 2017-05-25 DIAGNOSIS — N136 Pyonephrosis: Principal | ICD-10-CM | POA: Diagnosis present

## 2017-05-25 DIAGNOSIS — Z9049 Acquired absence of other specified parts of digestive tract: Secondary | ICD-10-CM

## 2017-05-25 DIAGNOSIS — Z87891 Personal history of nicotine dependence: Secondary | ICD-10-CM

## 2017-05-25 DIAGNOSIS — E039 Hypothyroidism, unspecified: Secondary | ICD-10-CM | POA: Diagnosis present

## 2017-05-25 DIAGNOSIS — I10 Essential (primary) hypertension: Secondary | ICD-10-CM | POA: Diagnosis not present

## 2017-05-25 DIAGNOSIS — I2510 Atherosclerotic heart disease of native coronary artery without angina pectoris: Secondary | ICD-10-CM | POA: Diagnosis present

## 2017-05-25 DIAGNOSIS — N202 Calculus of kidney with calculus of ureter: Secondary | ICD-10-CM | POA: Diagnosis not present

## 2017-05-25 DIAGNOSIS — I739 Peripheral vascular disease, unspecified: Secondary | ICD-10-CM | POA: Diagnosis present

## 2017-05-25 DIAGNOSIS — Z79899 Other long term (current) drug therapy: Secondary | ICD-10-CM

## 2017-05-25 DIAGNOSIS — N390 Urinary tract infection, site not specified: Secondary | ICD-10-CM | POA: Diagnosis not present

## 2017-05-25 LAB — COMPREHENSIVE METABOLIC PANEL
ALT: 56 U/L (ref 17–63)
AST: 24 U/L (ref 15–41)
Albumin: 3.6 g/dL (ref 3.5–5.0)
Alkaline Phosphatase: 104 U/L (ref 38–126)
Anion gap: 8 (ref 5–15)
BILIRUBIN TOTAL: 0.8 mg/dL (ref 0.3–1.2)
BUN: 57 mg/dL — AB (ref 6–20)
CO2: 22 mmol/L (ref 22–32)
CREATININE: 3.91 mg/dL — AB (ref 0.61–1.24)
Calcium: 8.7 mg/dL — ABNORMAL LOW (ref 8.9–10.3)
Chloride: 111 mmol/L (ref 101–111)
GFR calc Af Amer: 16 mL/min — ABNORMAL LOW (ref >60)
GFR, EST NON AFRICAN AMERICAN: 14 mL/min — AB (ref >60)
Glucose, Bld: 152 mg/dL — ABNORMAL HIGH (ref 65–99)
POTASSIUM: 4.4 mmol/L (ref 3.5–5.1)
Sodium: 141 mmol/L (ref 135–145)
TOTAL PROTEIN: 7 g/dL (ref 6.5–8.1)

## 2017-05-25 LAB — BASIC METABOLIC PANEL
Anion gap: 10 (ref 5–15)
BUN: 53 mg/dL — ABNORMAL HIGH (ref 6–20)
CO2: 23 mmol/L (ref 22–32)
Calcium: 9.4 mg/dL (ref 8.9–10.3)
Chloride: 108 mmol/L (ref 101–111)
Creatinine, Ser: 3.85 mg/dL — ABNORMAL HIGH (ref 0.61–1.24)
GFR calc Af Amer: 16 mL/min — ABNORMAL LOW (ref >60)
GFR calc non Af Amer: 14 mL/min — ABNORMAL LOW (ref >60)
Glucose, Bld: 134 mg/dL — ABNORMAL HIGH (ref 65–99)
Potassium: 4.5 mmol/L (ref 3.5–5.1)
Sodium: 141 mmol/L (ref 135–145)

## 2017-05-25 LAB — CBC WITH DIFFERENTIAL/PLATELET
Basophils Absolute: 0 10*3/uL (ref 0.0–0.1)
Basophils Relative: 0 %
Eosinophils Absolute: 0.2 10*3/uL (ref 0.0–0.7)
Eosinophils Relative: 2 %
HCT: 42.7 % (ref 39.0–52.0)
Hemoglobin: 14.2 g/dL (ref 13.0–17.0)
Lymphocytes Relative: 9 %
Lymphs Abs: 1.1 10*3/uL (ref 0.7–4.0)
MCH: 31.3 pg (ref 26.0–34.0)
MCHC: 33.3 g/dL (ref 30.0–36.0)
MCV: 94.3 fL (ref 78.0–100.0)
Monocytes Absolute: 1 10*3/uL (ref 0.1–1.0)
Monocytes Relative: 8 %
Neutro Abs: 10.6 10*3/uL — ABNORMAL HIGH (ref 1.7–7.7)
Neutrophils Relative %: 81 %
Platelets: 162 10*3/uL (ref 150–400)
RBC: 4.53 MIL/uL (ref 4.22–5.81)
RDW: 14 % (ref 11.5–15.5)
WBC: 12.9 10*3/uL — ABNORMAL HIGH (ref 4.0–10.5)

## 2017-05-25 LAB — URINALYSIS, ROUTINE W REFLEX MICROSCOPIC
Bilirubin Urine: NEGATIVE
Glucose, UA: NEGATIVE mg/dL
Ketones, ur: NEGATIVE mg/dL
Nitrite: NEGATIVE
Protein, ur: >=300 mg/dL — AB
Specific Gravity, Urine: 1.01 (ref 1.005–1.030)
Squamous Epithelial / LPF: NONE SEEN
pH: 9 — ABNORMAL HIGH (ref 5.0–8.0)

## 2017-05-25 LAB — I-STAT CG4 LACTIC ACID, ED: Lactic Acid, Venous: 1.04 mmol/L (ref 0.5–1.9)

## 2017-05-25 MED ORDER — ONDANSETRON HCL 4 MG/2ML IJ SOLN
4.0000 mg | Freq: Once | INTRAMUSCULAR | Status: AC
  Administered 2017-05-25: 4 mg via INTRAVENOUS
  Filled 2017-05-25: qty 2

## 2017-05-25 MED ORDER — ACETAMINOPHEN 325 MG PO TABS
650.0000 mg | ORAL_TABLET | Freq: Once | ORAL | Status: AC | PRN
  Administered 2017-05-25: 650 mg via ORAL
  Filled 2017-05-25: qty 2

## 2017-05-25 MED ORDER — DEXTROSE 5 % IV SOLN
2.0000 g | Freq: Once | INTRAVENOUS | Status: AC
  Administered 2017-05-25: 2 g via INTRAVENOUS
  Filled 2017-05-25: qty 2

## 2017-05-25 MED ORDER — HYDROMORPHONE HCL 1 MG/ML IJ SOLN
1.0000 mg | Freq: Once | INTRAMUSCULAR | Status: AC
  Administered 2017-05-25: 1 mg via INTRAVENOUS
  Filled 2017-05-25: qty 1

## 2017-05-25 NOTE — ED Notes (Signed)
Code Sepsis called per Dr Jeffie Pollock

## 2017-05-25 NOTE — ED Triage Notes (Signed)
Pt c/o back pain, fever, hematuria and states his catheter is not draining. Pt seen pcp today for the same.

## 2017-05-25 NOTE — ED Provider Notes (Addendum)
Jordan Ward Provider Note   CSN: [id number] Arrival date & time: 05/25/17  2022     History   Chief Complaint Chief Complaint  Patient presents with  . Back Pain    HPI Jordan Ward is a 76 y.o. male.  HPI  The patient is a 76 year old male, history of a neurogenic bladder, history of a colectomy secondary to multiple polyps, has had an indwelling catheter in the suprapubic region for quite some time and has had obstructions of this catheter in the past requiring changing once a month as well as an admission in November 2017 secondary to hydronephrosis thought to be related to an infectious obstruction. The patient improved at that time however over the last 24 hours the patient has had recurrent pain, left side, flank and left lower quadrant, nausea accompanies this and the patient has associated very little urinary output measuring less than 50 mL since his visit at his doctor's office this morning. His pain is worsening, his nausea is worsening thus he comes to the emergency department. He states he has had subjective fevers and chills but has not measured it. A urinalysis was done at the doctor's office this morning thinking that this might be pyelonephritis. The patient's primary urologist is Dr. Eulogio Ditch.  Past Medical History:  Diagnosis Date  . Anemia   . Arthritis   . Back pain, chronic   . Bilateral carotid bruits   . C. difficile colitis 09/2011  . CAD (coronary artery disease)   . Carotid artery stenosis   . Cerebrovascular disease   . Colon polyps   . Dyslipidemia   . Dysphagia 10/07/2011  . Encephalopathy   . Gait disorder   . HA (headache)   . High grade dysplasia in colonic adenoma 09/2005  . HTN (hypertension), malignant 10/06/2011  . Hypernatremia   . Hypokalemia   . Hypothyroidism 10/08/2011  . Insomnia   . Junctional rhythm   . Kidney stones   . MS (multiple sclerosis) ([place])   . Neuromuscular disorder ([place])   . OSA (obstructive sleep apnea)   .  Paroxysmal atrial tachycardia ([place])   . Peripheral vascular disease ([place])   . Pneumonia 4 yrs ago  . Pulmonary fibrosis ([place]) 10/06/2011  . Pulmonary nodule 10/08/2011  . PVD (peripheral vascular disease) ([place])   . Sacral ulcer ([place])   . Sleep apnea   . TIA (transient ischemic attack)   . Tremors of nervous system 10/08/2011  . Tremors of nervous system   . Urinary tract infection     Patient Active Problem List   Diagnosis Date Noted  . CKD (chronic kidney disease), stage III 08/23/2016  . Hydronephrosis of left kidney 08/23/2016  . Kidney stones 09/13/2015  . Essential hypertension   . Pressure ulcer 08/04/2015  . Acute renal failure superimposed on stage 3 chronic kidney disease ([place]) 08/04/2015  . Chronic systolic congestive heart failure ([place]) 08/04/2015  . Hydronephrosis with obstructing calculus 08/04/2015  . Occult blood positive stool 09/05/2014  . Rash and nonspecific skin eruption 08/25/2014  . Edema 08/19/2014  . Subacute confusional state 08/06/2014  . Dilated cardiomyopathy ([place]) 07/21/2014  . Sepsis ([place]) 07/17/2014  . Severe sepsis ([place]) 07/17/2014  . Headache 03/13/2014  . CVA (cerebral infarction) 03/13/2014  . Abnormality of gait 03/13/2014  . Tremors of nervous system 10/08/2011  . Hypothyroidism 10/08/2011  . Pulmonary nodule 10/08/2011  . Dysphagia 10/07/2011  . HTN (hypertension), malignant 10/06/2011  . Chronic suprapubic catheter ([place]) 10/06/2011  .  Pulmonary fibrosis ([place]) 10/06/2011  . Lower urinary tract infectious disease 10/03/2011  . PNA (pneumonia) probable 10/02/2011  . Junctional rhythm 10/02/2011  . Multiple sclerosis ([place]) 10/02/2011  . Anemia 06/18/2011  . Chronic diarrhea 06/17/2011  . Hx of adenomatous colonic polyps 06/17/2011  . High grade dysplasia in colonic adenoma 09/29/2005    Past Surgical History:  Procedure Laterality Date  . APPENDECTOMY  09/2005   at time of left hemicolectomy  . BACK SURGERY  1976/1979   lower  .  CHOLECYSTECTOMY     Dr. Tamala Julian  . COLON SURGERY  09/2005   [place]le[place]man: four tubular adenomas, large adenomatous polyp with HIGH GRADE dysplasia  . COLONOSCOPY  11/2004   Dr. Sharol Roussel sessile polyp splenic flexure, 57mm sessile polyp desc colon, tubulovillous adenoma (bx not removed)  . COLONOSCOPY  01/2005   poor prep, polyp could not be found  . COLONOSCOPY  05/2005   with EMR, polypectomy Dr. Olegario Messier, bx showed high grade dysplasia, partially resected  . COLONOSCOPY  09/2005   Dr. Arsenio Loader, [place] ink tattooing, four villous colon polyp (3 had been missed on previous colonoscopies due to limitations of procedures  . COLONOSCOPY  09/2006   normal TI, no polyps  . COLONOSCOPY  10/2007   Dr. Imogene Burn distal mammillations, benign bx, normal TI, random bx neg for microscopic colitis  . INGUINAL HERNIA REPAIR  1971   bilateral  . KIDNEY STONE SURGERY  09/13/2015  . NEPHROLITHOTOMY Left 09/13/2015   Procedure: LEFT PERCUTANEOUS NEPHROLITHOTOMY ;  Surgeon: Franchot Gallo, MD;  Location: [facility];  Service: Urology;  Laterality: Left;  . SUPRAPUBIC CATHETER INSERTION         Home Medications    Prior to Admission medications   Medication Sig Start Date End Date Taking? Authorizing Provider  carvedilol (COREG) 6.25 MG tablet Take 1 tablet (6.25 mg total) by mouth 2 (two) times daily with a meal. 07/26/14   Verlee Monte, MD  diazepam (VALIUM) 5 MG tablet TAKE 1 TABLET BY MOUTH TWICE A DAY 03/14/17   Sater, Nanine Means, MD  HYDROcodone-acetaminophen (NORCO) 7.5-325 MG per tablet Take 1 tablet by mouth 2 (two) times daily. Max APAP 3 GM IN 24 HOURS FROM ALL SOURCES Patient taking differently: Take 1 tablet by mouth every 6 (six) hours as needed for moderate pain. Max APAP 3 GM IN 24 HOURS FROM ALL SOURCES 08/22/14   Blanchie Serve, MD  levothyroxine (SYNTHROID, LEVOTHROID) 88 MCG tablet Take 88 mcg by mouth daily before breakfast.    [provider]  potassium chloride SA  (K-DUR,KLOR-CON) 20 MEQ tablet Take 20 mEq by mouth daily.    Rexene Alberts, MD  solifenacin (VESICARE) 10 MG tablet Take 10 mg by mouth daily as needed (for spasms).     [provider]  verapamil (CALAN) 40 MG tablet Take 40 mg by mouth 2 (two) times daily.    [provider]    Family History Family History  Problem Relation Age of Onset  . Cirrhosis Brother        etoh  . Stroke Mother 16  . Coronary artery disease Father 39  . Heart attack Brother   . Cancer Sister   . Multiple sclerosis Unknown   . Colon cancer Neg Hx     Social History Social History  Substance Use Topics  . Smoking status: Former Smoker    Types: Cigarettes  . Smokeless tobacco: Never Used     Comment: quit remote  .  Alcohol use No     Allergies   Tetracyclines & related and Ciprofloxacin   Review of Systems Review of Systems  All other systems reviewed and are negative.    Physical Exam Updated Vital Signs BP (!) 200/99   Pulse 81   Temp 99.3 F (37.4 C) (Oral)   Resp 20   Ht 5\' 6"  (1.676 m)   Wt 61.2 kg (135 lb)   SpO2 97%   BMI 21.79 kg/m   Physical Exam  Constitutional: He appears well-developed and well-nourished. He appears distressed.  HENT:  Head: Normocephalic and atraumatic.  Mouth/Throat: Oropharynx is clear and moist. No oropharyngeal exudate.  Eyes: Pupils are equal, round, and reactive to light. Conjunctivae and EOM are normal. Right eye exhibits no discharge. Left eye exhibits no discharge. No scleral icterus.  Neck: Normal range of motion. Neck supple. No JVD present. No thyromegaly present.  Cardiovascular: Normal rate, regular rhythm, normal heart sounds and intact distal pulses.  Exam reveals no gallop and no friction rub.   No murmur heard. Pulmonary/Chest: Effort normal and breath sounds normal. No respiratory distress. He has no wheezes. He has no rales.  Abdominal: Soft. Bowel sounds are normal. He exhibits no distension and no mass. There  is no tenderness.  Large midline scar - minimal ttp to the L abdomen - no other ttp - Mild L sided CVA ttp.  Large lower abdominal hernia -s oft and non tender.  Foley bag has small am't of dark yellow urine  Musculoskeletal: Normal range of motion. He exhibits no edema or tenderness.  Lymphadenopathy:    He has no cervical adenopathy.  Neurological: He is alert. Coordination normal.  Skin: Skin is warm and dry. No rash noted. No erythema.  Psychiatric: He has a normal mood and affect. His behavior is normal.  Nursing note and vitals reviewed.    ED Treatments / Results  Labs (all labs ordered are listed, but only abnormal results are displayed) Labs Reviewed  URINALYSIS, ROUTINE W REFLEX MICROSCOPIC  CBC WITH DIFFERENTIAL/PLATELET  BASIC METABOLIC PANEL     Radiology No results found.  Procedures Procedures (including critical care time)  CRITICAL CARE Performed by: Johnna Acosta Total critical care time: 35 minutes Critical care time was exclusive of separately billable procedures and treating other patients. Critical care was necessary to treat or prevent imminent or life-threatening deterioration. Critical care was time spent personally by me on the following activities: development of treatment plan with patient and/or surrogate as well as nursing, discussions with consultants, evaluation of patient's response to treatment, examination of patient, obtaining history from patient or surrogate, ordering and performing treatments and interventions, ordering and review of laboratory studies, ordering and review of radiographic studies, pulse oximetry and re-evaluation of patient's condition.   Medications Ordered in ED Medications  HYDROmorphone (DILAUDID) injection 1 mg (not administered)  ondansetron (ZOFRAN) injection 4 mg (not administered)     Initial Impression / Assessment and Plan / ED Course  I have reviewed the triage vital signs and the nursing  notes.  Pertinent labs & imaging results that were available during my care of the patient were reviewed by me and considered in my medical decision making (see chart for details).     The patient appears to be very uncomfortable, this would be consistent with a urinary obstruction, my bedside ultrasound reveals no urine in the bladder, there is hydronephrosis on the left, CT scan pending to evaluate for possible mechanical obstruction by kidney  stone or otherwise. Pain medications ordered.  Ct confirms obstructive Uropathy UA shows signs of UTI Culture sent - Abx ordered Renal function has worsened and doubled to a Cr of 3.85 D/w Dr. Jeffie Pollock with urology who will accept pt in tras[facility]fer [facility] as he needs a percutaneous nephrostomy.  Fever and leukocytosis -  Has renal failure Code Sepsis activated Rocephin ordered  Final Clinical Impressions(s) / ED Diagnoses   Final diagnoses:  Obstructive uropathy  Kidney stone  Acute kidney injury ([place])  Sepsis, due to unspecified organism [facility])    New Prescriptions New Prescriptions   No medications on file     Noemi Chapel, MD 05/25/17 2209    Noemi Chapel, MD 05/25/17 2326

## 2017-05-25 NOTE — Progress Notes (Signed)
I was called by Dr. Sabra Heck regarding Jordan Ward.   He has a NGB with a chronic SP tube and right renal atrophy with chronic dilation of the left collecting system.   He is in the ER with left flank pain, nausea and a doubling of his Cr with reduced UOP.  On CT he has a left distal ureteral stone with increased obstruction.  I discussed the case with Dr. Sabra Heck and have recommended he be transferred to [facility] for a left percutaneous nephrostomy tube placement and admission to our service.  I requested a urine culture be obtained from the SP tube and that he be given Rocephin since he has a low grade temp of 99.3.     I will see him after he arrives at [facility].

## 2017-05-26 ENCOUNTER — Encounter (HOSPITAL_COMMUNITY): Payer: Self-pay | Admitting: Urology

## 2017-05-26 ENCOUNTER — Observation Stay (HOSPITAL_COMMUNITY): Payer: Medicare Other

## 2017-05-26 DIAGNOSIS — M1991 Primary osteoarthritis, unspecified site: Secondary | ICD-10-CM | POA: Diagnosis present

## 2017-05-26 DIAGNOSIS — R944 Abnormal results of kidney function studies: Secondary | ICD-10-CM | POA: Diagnosis not present

## 2017-05-26 DIAGNOSIS — Z86010 Personal history of colonic polyps: Secondary | ICD-10-CM | POA: Diagnosis not present

## 2017-05-26 DIAGNOSIS — N319 Neuromuscular dysfunction of bladder, unspecified: Secondary | ICD-10-CM | POA: Diagnosis present

## 2017-05-26 DIAGNOSIS — Z79899 Other long term (current) drug therapy: Secondary | ICD-10-CM | POA: Diagnosis not present

## 2017-05-26 DIAGNOSIS — N132 Hydronephrosis with renal and ureteral calculous obstruction: Secondary | ICD-10-CM | POA: Diagnosis not present

## 2017-05-26 DIAGNOSIS — E785 Hyperlipidemia, unspecified: Secondary | ICD-10-CM | POA: Diagnosis present

## 2017-05-26 DIAGNOSIS — I2510 Atherosclerotic heart disease of native coronary artery without angina pectoris: Secondary | ICD-10-CM | POA: Diagnosis present

## 2017-05-26 DIAGNOSIS — N179 Acute kidney failure, unspecified: Secondary | ICD-10-CM | POA: Diagnosis present

## 2017-05-26 DIAGNOSIS — E039 Hypothyroidism, unspecified: Secondary | ICD-10-CM | POA: Diagnosis present

## 2017-05-26 DIAGNOSIS — Z87891 Personal history of nicotine dependence: Secondary | ICD-10-CM | POA: Diagnosis not present

## 2017-05-26 DIAGNOSIS — I129 Hypertensive chronic kidney disease with stage 1 through stage 4 chronic kidney disease, or unspecified chronic kidney disease: Secondary | ICD-10-CM | POA: Diagnosis present

## 2017-05-26 DIAGNOSIS — N183 Chronic kidney disease, stage 3 (moderate): Secondary | ICD-10-CM | POA: Diagnosis present

## 2017-05-26 DIAGNOSIS — Z8673 Personal history of transient ischemic attack (TIA), and cerebral infarction without residual deficits: Secondary | ICD-10-CM | POA: Diagnosis not present

## 2017-05-26 DIAGNOSIS — N136 Pyonephrosis: Secondary | ICD-10-CM | POA: Diagnosis present

## 2017-05-26 DIAGNOSIS — G8929 Other chronic pain: Secondary | ICD-10-CM | POA: Diagnosis present

## 2017-05-26 DIAGNOSIS — Z881 Allergy status to other antibiotic agents status: Secondary | ICD-10-CM | POA: Diagnosis not present

## 2017-05-26 DIAGNOSIS — N261 Atrophy of kidney (terminal): Secondary | ICD-10-CM | POA: Diagnosis present

## 2017-05-26 DIAGNOSIS — N201 Calculus of ureter: Secondary | ICD-10-CM | POA: Diagnosis not present

## 2017-05-26 DIAGNOSIS — J8410 Pulmonary fibrosis, unspecified: Secondary | ICD-10-CM | POA: Diagnosis present

## 2017-05-26 DIAGNOSIS — N131 Hydronephrosis with ureteral stricture, not elsewhere classified: Secondary | ICD-10-CM | POA: Diagnosis present

## 2017-05-26 DIAGNOSIS — R509 Fever, unspecified: Secondary | ICD-10-CM | POA: Diagnosis not present

## 2017-05-26 DIAGNOSIS — Z87442 Personal history of urinary calculi: Secondary | ICD-10-CM | POA: Diagnosis not present

## 2017-05-26 DIAGNOSIS — Z9049 Acquired absence of other specified parts of digestive tract: Secondary | ICD-10-CM | POA: Diagnosis not present

## 2017-05-26 DIAGNOSIS — G4733 Obstructive sleep apnea (adult) (pediatric): Secondary | ICD-10-CM | POA: Diagnosis present

## 2017-05-26 DIAGNOSIS — G35 Multiple sclerosis: Secondary | ICD-10-CM | POA: Diagnosis present

## 2017-05-26 DIAGNOSIS — R310 Gross hematuria: Secondary | ICD-10-CM | POA: Diagnosis not present

## 2017-05-26 DIAGNOSIS — A419 Sepsis, unspecified organism: Secondary | ICD-10-CM | POA: Diagnosis not present

## 2017-05-26 DIAGNOSIS — N139 Obstructive and reflux uropathy, unspecified: Secondary | ICD-10-CM | POA: Diagnosis not present

## 2017-05-26 DIAGNOSIS — L8990 Pressure ulcer of unspecified site, unspecified stage: Secondary | ICD-10-CM | POA: Insufficient documentation

## 2017-05-26 DIAGNOSIS — I739 Peripheral vascular disease, unspecified: Secondary | ICD-10-CM | POA: Diagnosis present

## 2017-05-26 HISTORY — PX: IR NEPHROSTOMY PLACEMENT LEFT: IMG6063

## 2017-05-26 LAB — CBC
HEMATOCRIT: 37 % — AB (ref 39.0–52.0)
HEMOGLOBIN: 12.5 g/dL — AB (ref 13.0–17.0)
MCH: 30.9 pg (ref 26.0–34.0)
MCHC: 33.8 g/dL (ref 30.0–36.0)
MCV: 91.6 fL (ref 78.0–100.0)
Platelets: 140 10*3/uL — ABNORMAL LOW (ref 150–400)
RBC: 4.04 MIL/uL — AB (ref 4.22–5.81)
RDW: 13.9 % (ref 11.5–15.5)
WBC: 14.9 10*3/uL — ABNORMAL HIGH (ref 4.0–10.5)

## 2017-05-26 LAB — BASIC METABOLIC PANEL
Anion gap: 10 (ref 5–15)
BUN: 62 mg/dL — AB (ref 6–20)
CHLORIDE: 111 mmol/L (ref 101–111)
CO2: 21 mmol/L — AB (ref 22–32)
CREATININE: 4.24 mg/dL — AB (ref 0.61–1.24)
Calcium: 8.7 mg/dL — ABNORMAL LOW (ref 8.9–10.3)
GFR calc Af Amer: 15 mL/min — ABNORMAL LOW (ref >60)
GFR calc non Af Amer: 13 mL/min — ABNORMAL LOW (ref >60)
Glucose, Bld: 128 mg/dL — ABNORMAL HIGH (ref 65–99)
POTASSIUM: 4.5 mmol/L (ref 3.5–5.1)
Sodium: 142 mmol/L (ref 135–145)

## 2017-05-26 LAB — PROTIME-INR
INR: 1.17
Prothrombin Time: 14.8 seconds (ref 11.4–15.2)

## 2017-05-26 MED ORDER — DIAZEPAM 5 MG PO TABS
5.0000 mg | ORAL_TABLET | Freq: Two times a day (BID) | ORAL | Status: DC
  Administered 2017-05-26 – 2017-05-29 (×8): 5 mg via ORAL
  Filled 2017-05-26 (×8): qty 1

## 2017-05-26 MED ORDER — HYDROCODONE-ACETAMINOPHEN 7.5-325 MG PO TABS
1.0000 | ORAL_TABLET | Freq: Two times a day (BID) | ORAL | Status: DC
  Administered 2017-05-26 – 2017-05-29 (×7): 1 via ORAL
  Filled 2017-05-26 (×7): qty 1

## 2017-05-26 MED ORDER — MIDAZOLAM HCL 2 MG/2ML IJ SOLN
INTRAMUSCULAR | Status: AC | PRN
  Administered 2017-05-26: 0.5 mg via INTRAVENOUS
  Administered 2017-05-26: 1 mg via INTRAVENOUS

## 2017-05-26 MED ORDER — IOPAMIDOL (ISOVUE-300) INJECTION 61%
INTRAVENOUS | Status: AC
Start: 2017-05-26 — End: 2017-05-26
  Filled 2017-05-26: qty 50

## 2017-05-26 MED ORDER — SODIUM CHLORIDE 0.9% FLUSH
5.0000 mL | Freq: Three times a day (TID) | INTRAVENOUS | Status: DC
  Administered 2017-05-27 – 2017-05-29 (×3): 5 mL via INTRAVENOUS

## 2017-05-26 MED ORDER — HYDROMORPHONE HCL-NACL 0.5-0.9 MG/ML-% IV SOSY
0.5000 mg | PREFILLED_SYRINGE | INTRAVENOUS | Status: AC | PRN
  Administered 2017-05-26: 0.5 mg via INTRAVENOUS
  Filled 2017-05-26 (×2): qty 1

## 2017-05-26 MED ORDER — LIDOCAINE-EPINEPHRINE (PF) 2 %-1:200000 IJ SOLN
INTRAMUSCULAR | Status: AC | PRN
  Administered 2017-05-26: 20 mL

## 2017-05-26 MED ORDER — FENTANYL CITRATE (PF) 100 MCG/2ML IJ SOLN
INTRAMUSCULAR | Status: AC
  Filled 2017-05-26: qty 4

## 2017-05-26 MED ORDER — FENTANYL CITRATE (PF) 100 MCG/2ML IJ SOLN
INTRAMUSCULAR | Status: AC | PRN
  Administered 2017-05-26: 25 ug via INTRAVENOUS

## 2017-05-26 MED ORDER — ACETAMINOPHEN 325 MG PO TABS: 650.0000 mg | ORAL_TABLET | ORAL | Status: DC | PRN

## 2017-05-26 MED ORDER — LIDOCAINE-EPINEPHRINE (PF) 2 %-1:200000 IJ SOLN
INTRAMUSCULAR | Status: AC
  Filled 2017-05-26: qty 20

## 2017-05-26 MED ORDER — ONDANSETRON HCL 4 MG/2ML IJ SOLN: 4.0000 mg | INTRAMUSCULAR | Status: DC | PRN

## 2017-05-26 MED ORDER — ONDANSETRON HCL 4 MG/2ML IJ SOLN: 4.0000 mg | Freq: Three times a day (TID) | INTRAMUSCULAR | Status: AC | PRN

## 2017-05-26 MED ORDER — SENNOSIDES-DOCUSATE SODIUM 8.6-50 MG PO TABS: 1.0000 | ORAL_TABLET | Freq: Every evening | ORAL | Status: DC | PRN

## 2017-05-26 MED ORDER — FLEET ENEMA 7-19 GM/118ML RE ENEM: 1.0000 | ENEMA | Freq: Once | RECTAL | Status: DC | PRN

## 2017-05-26 MED ORDER — HYDROMORPHONE HCL-NACL 0.5-0.9 MG/ML-% IV SOSY
0.5000 mg | PREFILLED_SYRINGE | INTRAVENOUS | Status: DC | PRN
  Administered 2017-05-26 – 2017-05-27 (×2): 1 mg via INTRAVENOUS
  Administered 2017-05-27: 0.5 mg via INTRAVENOUS
  Filled 2017-05-26: qty 1
  Filled 2017-05-26 (×2): qty 2

## 2017-05-26 MED ORDER — DEXTROSE 5 % IV SOLN
2.0000 g | INTRAVENOUS | Status: AC
  Administered 2017-05-26: 2 g via INTRAVENOUS
  Filled 2017-05-26: qty 2

## 2017-05-26 MED ORDER — POTASSIUM CHLORIDE CRYS ER 20 MEQ PO TBCR
20.0000 meq | EXTENDED_RELEASE_TABLET | Freq: Every day | ORAL | Status: DC
  Administered 2017-05-26 – 2017-05-29 (×4): 20 meq via ORAL
  Filled 2017-05-26 (×4): qty 1

## 2017-05-26 MED ORDER — CARVEDILOL 6.25 MG PO TABS
6.2500 mg | ORAL_TABLET | Freq: Two times a day (BID) | ORAL | Status: DC
  Administered 2017-05-26 – 2017-05-29 (×7): 6.25 mg via ORAL
  Filled 2017-05-26 (×7): qty 1

## 2017-05-26 MED ORDER — POTASSIUM CHLORIDE IN NACL 20-0.45 MEQ/L-% IV SOLN
INTRAVENOUS | Status: DC
  Administered 2017-05-26 – 2017-05-29 (×7): via INTRAVENOUS
  Filled 2017-05-26 (×9): qty 1000

## 2017-05-26 MED ORDER — MIDAZOLAM HCL 2 MG/2ML IJ SOLN
INTRAMUSCULAR | Status: AC
  Filled 2017-05-26: qty 4

## 2017-05-26 MED ORDER — BISACODYL 10 MG RE SUPP: 10.0000 mg | Freq: Every day | RECTAL | Status: DC | PRN

## 2017-05-26 MED ORDER — LEVOTHYROXINE SODIUM 88 MCG PO TABS
88.0000 ug | ORAL_TABLET | Freq: Every day | ORAL | Status: DC
  Administered 2017-05-26 – 2017-05-29 (×4): 88 ug via ORAL
  Filled 2017-05-26 (×4): qty 1

## 2017-05-26 MED ORDER — DARIFENACIN HYDROBROMIDE ER 15 MG PO TB24
15.0000 mg | ORAL_TABLET | Freq: Every day | ORAL | Status: DC
  Administered 2017-05-26 – 2017-05-29 (×4): 15 mg via ORAL
  Filled 2017-05-26 (×4): qty 1

## 2017-05-26 MED ORDER — VERAPAMIL HCL 40 MG PO TABS
40.0000 mg | ORAL_TABLET | Freq: Two times a day (BID) | ORAL | Status: DC
  Administered 2017-05-26 – 2017-05-29 (×7): 40 mg via ORAL
  Filled 2017-05-26 (×7): qty 1

## 2017-05-26 MED ORDER — DEXTROSE 5 % IV SOLN
1.0000 g | INTRAVENOUS | Status: DC
  Administered 2017-05-26 – 2017-05-28 (×3): 1 g via INTRAVENOUS
  Filled 2017-05-26 (×5): qty 10

## 2017-05-26 NOTE — H&P (Signed)
Subjective: CC: Left flank pain.  Hx:  [name] is a 76 yo WM who I was seen in the [facility] ER for left flank pain that began acutely yesterday morning and was associated with decreased UOP from his SP tube.   He saw Dr. Luan Pulling yesterday morning and was given and injection of antibiotic and a script for Levaquin of which he took one.  The pain progressed and he went to the ER where he was found to have a Cr of 3.9 and an obstructing left distal ureteral stone with acute on chronic left hydronephrosis and an atrophied right kidney.   He had a low grade fever at home that was 99.3 on admission to the ER but then went to 102.   He has nausea with vomiting.  He had hematuria this morning.  He has a chronic SP tube for a NGB from [place] and has had prior stones and a left PCNL in 2016.   The tube was changed last Tuesday.   A lactate is normal and his WBC is 12.9K.   ROS:  Review of Systems  Constitutional: Positive for chills, fever and malaise/fatigue.  Respiratory: Negative for cough and shortness of breath.   Cardiovascular: Negative for chest pain and leg swelling.  Gastrointestinal: Positive for abdominal pain, constipation, nausea and vomiting.  Genitourinary: Positive for flank pain and hematuria.  Neurological: Positive for focal weakness (in legs).  All other systems reviewed and are negative.   Allergies  Allergen Reactions  . Tetracyclines & Related Anaphylaxis and Rash  . Ciprofloxacin     Trouble swallowing unknown reaction according to wife     Past Medical History:  Diagnosis Date  . Anemia   . Arthritis   . Back pain, chronic   . Bilateral carotid bruits   . C. difficile colitis 09/2011  . CAD (coronary artery disease)   . Carotid artery stenosis   . Cerebrovascular disease   . Colon polyps   . Dyslipidemia   . Dysphagia 10/07/2011  . Encephalopathy   . Gait disorder   . HA (headache)   . High grade dysplasia in colonic adenoma 09/2005  . HTN (hypertension), malignant  10/06/2011  . Hypernatremia   . Hypokalemia   . Hypothyroidism 10/08/2011  . Insomnia   . Junctional rhythm   . Kidney stones   . MS (multiple sclerosis) ([place])   . Neuromuscular disorder ([place])   . OSA (obstructive sleep apnea)   . Paroxysmal atrial tachycardia ([place]   . Peripheral vascular disease ([place])   . Pneumonia 4 yrs ago  . Pulmonary fibrosis ([place]) 10/06/2011  . Pulmonary nodule 10/08/2011  . PVD (peripheral vascular disease) ([place])   . Sacral ulcer ([place])   . Sleep apnea   . TIA (transient ischemic attack)   . Tremors of nervous system 10/08/2011  . Tremors of nervous system   . Urinary tract infection     Past Surgical History:  Procedure Laterality Date  . APPENDECTOMY  09/2005   at time of left hemicolectomy  . BACK SURGERY  1976/1979   lower  . CHOLECYSTECTOMY     Dr. Tamala Julian  . COLON SURGERY  09/2005   [place]man: four tubular adenomas, large adenomatous polyp with HIGH GRADE dysplasia  . COLONOSCOPY  11/2004   Dr. Sharol Roussel sessile polyp splenic flexure, 71mm sessile polyp desc colon, tubulovillous adenoma (bx not removed)  . COLONOSCOPY  01/2005   poor prep, polyp could not be found  . COLONOSCOPY  05/2005  with EMR, polypectomy Dr. Olegario Messier, bx showed high grade dysplasia, partially resected  . COLONOSCOPY  09/2005   Dr. Arsenio Loader, [place] ink tattooing, four villous colon polyp (3 had been missed on previous colonoscopies due to limitations of procedures  . COLONOSCOPY  09/2006   normal TI, no polyps  . COLONOSCOPY  10/2007   Dr. Imogene Burn distal mammillations, benign bx, normal TI, random bx neg for microscopic colitis  . INGUINAL HERNIA REPAIR  1971   bilateral  . KIDNEY STONE SURGERY  09/13/2015  . NEPHROLITHOTOMY Left 09/13/2015   Procedure: LEFT PERCUTANEOUS NEPHROLITHOTOMY ;  Surgeon: Franchot Gallo, MD;  Location: [facility];  Service: Urology;  Laterality: Left;  . SUPRAPUBIC CATHETER INSERTION      Social History   Social History  . Marital status:  Married    Spouse name: Pricilla Holm  . Number of children: 2  . Years of education: 10   Occupational History  . disability   .  Retired   Social History Main Topics  . Smoking status: Former Smoker    Types: Cigarettes  . Smokeless tobacco: Never Used     Comment: quit remote  . Alcohol use No  . Drug use: No  . Sexual activity: Not on file   Other Topics Concern  . Not on file   Social History Narrative   Patient is married Pricilla Holm) and  Lives w/ wife.   Patient is retired.   Patient has a 10th grade education.   Patient has two children.      Patient drinks about 1-2 sodas daily.   Patient is left handed.     Family History  Problem Relation Age of Onset  . Cirrhosis Brother        etoh  . Stroke Mother 11  . Coronary artery disease Father 30  . Heart attack Brother   . Cancer Sister   . Multiple sclerosis Unknown   . Colon cancer Neg Hx     Anti-infectives: Anti-infectives    Start     Dose/Rate Route Frequency Ordered Stop   05/25/17 2330  cefTRIAXone (ROCEPHIN) 2 g in dextrose 5 % 50 mL IVPB     2 g 100 mL/hr over 30 Minutes Intravenous  Once 05/25/17 2323 05/26/17 0002      Current Facility-Administered Medications  Medication Dose Route Frequency Provider Last Rate Last Dose  . HYDROmorphone (DILAUDID) injection 0.5 mg  0.5 mg Intravenous Q4H PRN Noemi Chapel, MD      . ondansetron [facility]) injection 4 mg  4 mg Intravenous Q8H PRN Noemi Chapel, MD       Home meds:   Diazepam, Norco Ibuprofen levaquin Levothyroxine Potassium chloride vesicare Verapamil.  Past medical, surgical, social and family history reviewed.   Objective: Vital signs in last 24 hours: Temp:  [99.3 F (37.4 C)-102.3 F (39.1 C)] 99.9 F (37.7 C) (08/28 0119) Pulse Rate:  [73-92] 73 (08/28 0119) Resp:  [18-20] 20 (08/28 0119) BP: (138-200)/(77-99) 138/77 (08/28 0119) SpO2:  [96 %-97 %] 96 % (08/28 0119) Weight:  [61.2 kg (135 lb)-62.1 kg (136 lb 14.5 oz)] 62.1 kg  (136 lb 14.5 oz) (08/28 0119)  Intake/Output from previous day: No intake/output data recorded. Intake/Output this shift: No intake/output data recorded.   Physical Exam  Constitutional: He is oriented to person, place, and time and well-developed, well-nourished, and in no distress.  HENT:  Head: Normocephalic and atraumatic.  Neck: Normal range of motion. Neck supple. No thyromegaly present.  Cardiovascular: Normal rate and regular rhythm.   Pulmonary/Chest: Effort normal and breath sounds normal. No respiratory distress.  Abdominal: Soft. He exhibits no distension. There is tenderness (left cvat).  SP tube in place with minimal drainage.  Musculoskeletal: Normal range of motion. He exhibits no edema or tenderness.  Neurological: He is alert and oriented to person, place, and time.  Weakness in legs  Skin: Skin is warm and dry.  Psychiatric: Mood and affect normal.    Lab Results:   Recent Labs  05/25/17 2112  WBC 12.9*  HGB 14.2  HCT 42.7  PLT 162   BMET  Recent Labs  05/25/17 2112 05/25/17 2316  NA 141 141  K 4.5 4.4  CL 108 111  CO2 23 22  GLUCOSE 134* 152*  BUN 53* 57*  CREATININE 3.85* 3.91*  CALCIUM 9.4 8.7*   PT/INR No results for input(s): LABPROT, INR in the last 72 hours. ABG No results for input(s): PHART, HCO3 in the last 72 hours.  Invalid input(s): PCO2, PO2  Studies/Results: Dg Chest Portable 1 View  Result Date: 05/26/2017 CLINICAL DATA:  Sepsis tachycardia EXAM: PORTABLE CHEST 1 VIEW COMPARISON:  08/03/2015, CT abdomen pelvis 05/25/2017 FINDINGS: Coarse bibasilar interstitial opacity left greater than right likely due to chronic interstitial change. No focal consolidation or effusion. Stable cardiomediastinal silhouette. Aortic atherosclerosis. No pneumothorax. IMPRESSION: Coarse left greater than right interstitial bibasilar opacity, likely due to fibrosis. No acute consolidation or effusion. Electronically Signed   By: Donavan Foil M.D.    On: 05/26/2017 00:55   Ct Renal Stone Study  Result Date: 05/25/2017 CLINICAL DATA:  Left back pain with fever and hematuria EXAM: CT ABDOMEN AND PELVIS WITHOUT CONTRAST TECHNIQUE: Multidetector CT imaging of the abdomen and pelvis was performed following the standard protocol without IV contrast. COMPARISON:  11/01/2016, 11/20/2016, 08/23/2016, 08/04/2015 FINDINGS: Lower chest: Patchy dependent atelectasis. No focal consolidation or pleural effusion. Coronary artery calcification. Hepatobiliary: No focal hepatic abnormality. Surgical clips in the gallbladder fossa. Stable enlarged extrahepatic bile duct, measuring up to 15 mm. Hyperdense material or stones within the distal bile duct near the head of pancreas as before. Pancreas: Unremarkable. No pancreatic ductal dilatation or surrounding inflammatory changes. Spleen: Normal in size without focal abnormality. Adrenals/Urinary Tract: Adrenal glands are within normal limits. Atrophic right kidney with intrarenal calculi. 11 mm cyst anterior right kidney. 6 mm stone lower pole right kidney. No ureteral stone on the right. Severe hydronephrosis and hydroureter of the left kidney, worse since prior CT. Increased left perinephric fat stranding. 6 mm stone in the distal left ureter, about 6 cm cephalad to the left the UVJ. Suprapubic catheter in the bladder which is decompressed. Bladder is thick walled. Single large or multiple stones within the posterior aspect of the bladder measuring 3.1 cm transverse aggregate. Stomach/Bowel: Stomach is nonenlarged. No dilated small bowel. Status post appendectomy. No colon Olivar thickening. Postsurgical changes of the distal colon. Vascular/Lymphatic: Moderate severe atherosclerotic calcification of the aorta. Severe narrowing and tapering of the distal abdominal aorta to minimum diameter of 9 mm AP with diminutive appearing iliac vessels. Retroperitoneal adenopathy with lymph nodes measuring up to 11 mm. Reproductive: No  masses Other: Negative for free air or free fluid. Anterior abdominal Brake laxity with protrusion of intra-abdominal fat and bowel anteriorly. Musculoskeletal: Degenerative changes. No acute or suspicious bone lesion. IMPRESSION: 1. Severe left hydronephrosis and hydroureter, increased compared to prior CT. Worsens left perinephric fat stranding. 6 mm stone in the distal left ureter, about  6 cm cephalad to the left UVJ. 2. Atrophic right kidney containing a nonobstructing stone. Suprapubic catheter in place with decompressed bladder. Single large or multiple adjacent stones within the dependent portion of the bladder 3. Stable enlarged extrahepatic bile duct with hyperdense material or stones in the distal common duct as before consistent with choledocholithiasis. 4. Atherosclerotic vascular disease with severe stenosis of the distal abdominal aorta and aortic bifurcation with diminutive appearing iliac vessels. Electronically Signed   By: Donavan Foil M.D.   On: 05/25/2017 22:46   I have discussed his case with Dr. Sabra Heck and reviewed the CT films and reports along with prior films.  I have reviewed his labs.  I have reviewed his prior office and hospital notes.    Assessment: Left ureteral stone with febrile UTI and ARI.   He will be admitted for IV antibiotics and I will arrange left NT placement because of his history of a chronic SP tube which will make cystoscopy and stenting more difficult.     CC: Dr. Velvet Bathe     Malka So 05/26/2017 [phone]

## 2017-05-26 NOTE — Progress Notes (Signed)
Assumed care from previous RN and agree with earlier assessment. Continue to monitore. Eulas Post, RN

## 2017-05-26 NOTE — Progress Notes (Signed)
Referring Physician(s): Keene Breath  Supervising Physician: Jacqulynn Cadet  Patient Status:  [facility] - In-pt  Chief Complaint:  Left flank pain, ureteral stone, fever  Subjective: Patient familiar to IR service from prior left nephrostomy  as well as hemodialysis catheter placement on 08/05/15. He has a known history of MS with chronic suprapubic catheter placement secondary to neurogenic bladder. He has a history of renal stones and underwent his prior left nephrostomy secondary to obstructive uropathy with ureteral stone and renal insufficiency. He later underwent left PCNL. He now presents with recurrent left flank pain, elevated creatinine, fever, elevated white count and diminished suprapubic tube output. He has also had recent nausea as well as hematuria. CT renal stone study performed yesterday revealed:  1. Severe left hydronephrosis and hydroureter, increased compared to prior CT. Worsens left perinephric fat stranding. 6 mm stone in the distal left ureter, about 6 cm cephalad to the left UVJ. 2. Atrophic right kidney containing a nonobstructing stone. Suprapubic catheter in place with decompressed bladder. Single large or multiple adjacent stones within the dependent portion of the bladder 3. Stable enlarged extrahepatic bile duct with hyperdense material or stones in the distal common duct as before consistent with choledocholithiasis. 4. Atherosclerotic vascular disease with severe stenosis of the distal abdominal aorta and aortic bifurcation with diminutive appearing iliac vessels  Request now received from urology for left percutaneous nephrostomy.   Allergies: Tetracyclines & related and Ciprofloxacin  Medications: Prior to Admission medications   Medication Sig Start Date End Date Taking? Authorizing Provider  carvedilol (COREG) 6.25 MG tablet Take 1 tablet (6.25 mg total) by mouth 2 (two) times daily with a meal. 07/26/14  Yes Elmahi, Mutaz, MD  diazepam  (VALIUM) 5 MG tablet TAKE 1 TABLET BY MOUTH TWICE A DAY 03/14/17  Yes Sater, Nanine Means, MD  HYDROcodone-acetaminophen (NORCO) 7.5-325 MG per tablet Take 1 tablet by mouth 2 (two) times daily. Max APAP 3 GM IN 24 HOURS FROM ALL SOURCES 08/22/14  Yes Blanchie Serve, MD  ibuprofen (ADVIL,MOTRIN) 200 MG tablet Take 200 mg by mouth every 6 (six) hours as needed for fever.   Yes [provider]  levofloxacin (LEVAQUIN) 500 MG tablet Take 500 mg by mouth daily.  05/25/17  Yes [provider]  levothyroxine (SYNTHROID, LEVOTHROID) 88 MCG tablet Take 88 mcg by mouth daily before breakfast.   Yes [provider]  potassium chloride SA (K-DUR,KLOR-CON) 20 MEQ tablet Take 20 mEq by mouth daily.   Yes Rexene Alberts, MD  solifenacin (VESICARE) 10 MG tablet Take 10 mg by mouth daily as needed (for spasms).    Yes [provider]  verapamil (CALAN) 40 MG tablet Take 40 mg by mouth 2 (two) times daily.   Yes [provider]     Vital Signs: BP 138/84 (BP Location: Left Arm)   Pulse 72   Temp 98.2 F (36.8 C) (Oral)   Resp 20   Ht 5\' 5"  (1.651 m)   Wt 136 lb 14.5 oz (62.1 kg)   SpO2 98%   BMI 22.78 kg/m   Physical Exam awake, alert; chest clear to auscultation bilaterally. Heart with regular rate and rhythm. Abdomen soft, positive bowel sounds, suprapubic catheter intact draining yellow urine. Left CVA tenderness noted. No lower extremity edema.  Imaging: Dg Chest Portable 1 View  Result Date: 05/26/2017 CLINICAL DATA:  Sepsis tachycardia EXAM: PORTABLE CHEST 1 VIEW COMPARISON:  08/03/2015, CT abdomen pelvis 05/25/2017 FINDINGS: Coarse bibasilar interstitial opacity left greater than right  likely due to chronic interstitial change. No focal consolidation or effusion. Stable cardiomediastinal silhouette. Aortic atherosclerosis. No pneumothorax. IMPRESSION: Coarse left greater than right interstitial bibasilar opacity, likely due to fibrosis. No acute consolidation  or effusion. Electronically Signed   By: Donavan Foil M.D.   On: 05/26/2017 00:55   Ct Renal Stone Study  Result Date: 05/25/2017 CLINICAL DATA:  Left back pain with fever and hematuria EXAM: CT ABDOMEN AND PELVIS WITHOUT CONTRAST TECHNIQUE: Multidetector CT imaging of the abdomen and pelvis was performed following the standard protocol without IV contrast. COMPARISON:  11/01/2016, 11/20/2016, 08/23/2016, 08/04/2015 FINDINGS: Lower chest: Patchy dependent atelectasis. No focal consolidation or pleural effusion. Coronary artery calcification. Hepatobiliary: No focal hepatic abnormality. Surgical clips in the gallbladder fossa. Stable enlarged extrahepatic bile duct, measuring up to 15 mm. Hyperdense material or stones within the distal bile duct near the head of pancreas as before. Pancreas: Unremarkable. No pancreatic ductal dilatation or surrounding inflammatory changes. Spleen: Normal in size without focal abnormality. Adrenals/Urinary Tract: Adrenal glands are within normal limits. Atrophic right kidney with intrarenal calculi. 11 mm cyst anterior right kidney. 6 mm stone lower pole right kidney. No ureteral stone on the right. Severe hydronephrosis and hydroureter of the left kidney, worse since prior CT. Increased left perinephric fat stranding. 6 mm stone in the distal left ureter, about 6 cm cephalad to the left the UVJ. Suprapubic catheter in the bladder which is decompressed. Bladder is thick walled. Single large or multiple stones within the posterior aspect of the bladder measuring 3.1 cm transverse aggregate. Stomach/Bowel: Stomach is nonenlarged. No dilated small bowel. Status post appendectomy. No colon [place] thickening. Postsurgical changes of the distal colon. Vascular/Lymphatic: Moderate severe atherosclerotic calcification of the aorta. Severe narrowing and tapering of the distal abdominal aorta to minimum diameter of 9 mm AP with diminutive appearing iliac vessels. Retroperitoneal adenopathy  with lymph nodes measuring up to 11 mm. Reproductive: No masses Other: Negative for free air or free fluid. Anterior abdominal [place] laxity with protrusion of intra-abdominal fat and bowel anteriorly. Musculoskeletal: Degenerative changes. No acute or suspicious bone lesion. IMPRESSION: 1. Severe left hydronephrosis and hydroureter, increased compared to prior CT. Worsens left perinephric fat stranding. 6 mm stone in the distal left ureter, about 6 cm cephalad to the left UVJ. 2. Atrophic right kidney containing a nonobstructing stone. Suprapubic catheter in place with decompressed bladder. Single large or multiple adjacent stones within the dependent portion of the bladder 3. Stable enlarged extrahepatic bile duct with hyperdense material or stones in the distal common duct as before consistent with choledocholithiasis. 4. Atherosclerotic vascular disease with severe stenosis of the distal abdominal aorta and aortic bifurcation with diminutive appearing iliac vessels. Electronically Signed   By: Donavan Foil M.D.   On: 05/25/2017 22:46    Labs:  CBC:  Recent Labs  08/27/16 0511 11/01/16 2024 05/25/17 2112 05/26/17 0421  WBC 7.4 14.2* 12.9* 14.9*  HGB 12.5* 15.0 14.2 12.5*  HCT 37.6* 45.0 42.7 37.0*  PLT 109* 208 162 140*    COAGS:  Recent Labs  08/25/16 0441  INR 1.06    BMP:  Recent Labs  11/01/16 2024 05/25/17 2112 05/25/17 2316 05/26/17 0421  NA 143 141 141 142  K 4.0 4.5 4.4 4.5  CL 109 108 111 111  CO2 26 23 22  21*  GLUCOSE 103* 134* 152* 128*  BUN 28* 53* 57* 62*  CALCIUM 9.3 9.4 8.7* 8.7*  CREATININE 1.72* 3.85* 3.91* 4.24*  GFRNONAA 38* 14* 14*  13*  GFRAA 44* 16* 16* 15*    LIVER FUNCTION TESTS:  Recent Labs  05/25/17 2316  BILITOT 0.8  AST 24  ALT 56  ALKPHOS 104  PROT 7.0  ALBUMIN 3.6    Assessment and Plan: Pt with known history of MS with chronic suprapubic catheter placement secondary to neurogenic bladder. He has a history of renal stones  and underwent his prior left nephrostomy secondary to obstructive uropathy with ureteral stone and renal insufficiency. He later underwent left PCNL. He now presents with recurrent left flank pain, elevated creatinine, fever, elevated white count and diminished suprapubic tube output. He has also had recent nausea as well as hematuria. CT renal stone study performed yesterday revealed:  1. Severe left hydronephrosis and hydroureter, increased compared to prior CT. Worsens left perinephric fat stranding. 6 mm stone in the distal left ureter, about 6 cm cephalad to the left UVJ. 2. Atrophic right kidney containing a nonobstructing stone. Suprapubic catheter in place with decompressed bladder. Single large or multiple adjacent stones within the dependent portion of the bladder 3. Stable enlarged extrahepatic bile duct with hyperdense material or stones in the distal common duct as before consistent with choledocholithiasis. 4. Atherosclerotic vascular disease with severe stenosis of the distal abdominal aorta and aortic bifurcation with diminutive appearing iliac vessels  Request now received from urology for left percutaneous nephrostomy.Imaging studies have been reviewed by Dr. Laurence Ferrari.Risks and benefits discussed with the patient including, but not limited to infection, bleeding, significant bleeding causing loss or decrease in renal function or damage to adjacent structures.  All of the patient's questions were answered, patient is agreeable to proceed. Consent signed and in chart.Procedure scheduled for this morning.       Electronically Signed: D. Rowe Robert, PA-C 05/26/2017, 9:59 AM   I spent a total of 25 minutes at the the patient's bedside AND on the patient's [facility] floor or unit, greater than 50% of which was counseling/coordinating care for left percutaneous nephrostomy    Patient ID: [facility], male   DOB: 03/27/1941, 76 y.o.   MRN: [id number]

## 2017-05-26 NOTE — Procedures (Signed)
Interventional Radiology Procedure Note  Procedure: Placement of a LEFT 3F percutaneous nephrostomy tube.   Complications: None  Estimated Blood Loss: None  Recommendations: - Bag drainage - Return to IR in 6 weeks for tube check and change  Signed,  Criselda Peaches, MD

## 2017-05-27 LAB — URINALYSIS, ROUTINE W REFLEX MICROSCOPIC
Bilirubin Urine: NEGATIVE
GLUCOSE, UA: NEGATIVE mg/dL
KETONES UR: NEGATIVE mg/dL
Nitrite: NEGATIVE
PROTEIN: 30 mg/dL — AB
SQUAMOUS EPITHELIAL / LPF: NONE SEEN
Specific Gravity, Urine: 1.009 (ref 1.005–1.030)
pH: 5 (ref 5.0–8.0)

## 2017-05-27 LAB — CBC WITH DIFFERENTIAL/PLATELET
Basophils Absolute: 0 10*3/uL (ref 0.0–0.1)
Basophils Relative: 0 %
Eosinophils Absolute: 0.3 10*3/uL (ref 0.0–0.7)
Eosinophils Relative: 3 %
HEMATOCRIT: 37.5 % — AB (ref 39.0–52.0)
HEMOGLOBIN: 12.5 g/dL — AB (ref 13.0–17.0)
LYMPHS ABS: 1.7 10*3/uL (ref 0.7–4.0)
LYMPHS PCT: 16 %
MCH: 31.3 pg (ref 26.0–34.0)
MCHC: 33.3 g/dL (ref 30.0–36.0)
MCV: 94 fL (ref 78.0–100.0)
MONOS PCT: 5 %
Monocytes Absolute: 0.6 10*3/uL (ref 0.1–1.0)
NEUTROS ABS: 8.3 10*3/uL — AB (ref 1.7–7.7)
NEUTROS PCT: 76 %
Platelets: 139 10*3/uL — ABNORMAL LOW (ref 150–400)
RBC: 3.99 MIL/uL — AB (ref 4.22–5.81)
RDW: 14.3 % (ref 11.5–15.5)
WBC: 10.9 10*3/uL — ABNORMAL HIGH (ref 4.0–10.5)

## 2017-05-27 LAB — BASIC METABOLIC PANEL
Anion gap: 7 (ref 5–15)
BUN: 47 mg/dL — AB (ref 6–20)
CHLORIDE: 108 mmol/L (ref 101–111)
CO2: 24 mmol/L (ref 22–32)
CREATININE: 3.25 mg/dL — AB (ref 0.61–1.24)
Calcium: 8.5 mg/dL — ABNORMAL LOW (ref 8.9–10.3)
GFR calc Af Amer: 20 mL/min — ABNORMAL LOW (ref >60)
GFR calc non Af Amer: 17 mL/min — ABNORMAL LOW (ref >60)
Glucose, Bld: 123 mg/dL — ABNORMAL HIGH (ref 65–99)
POTASSIUM: 4 mmol/L (ref 3.5–5.1)
Sodium: 139 mmol/L (ref 135–145)

## 2017-05-27 NOTE — Progress Notes (Signed)
Referring Physician(s): Jordan Ward  Supervising Physician: Jordan Ward  Patient Status:  [name] - In-pt  Chief Complaint:  Left hydronephrosis, flank pain, left ureteral stone  Subjective: Pt still having some left flank discomfort; also says he has had some perioral tingling; no hematuria   Allergies: Tetracyclines & related and Ciprofloxacin  Medications: Prior to Admission medications   Medication Sig Start Date End Date Taking? Authorizing Provider  carvedilol (COREG) 6.25 MG tablet Take 1 tablet (6.25 mg total) by mouth 2 (two) times daily with a meal. 07/26/14  Yes Elmahi, Mutaz, MD  diazepam (VALIUM) 5 MG tablet TAKE 1 TABLET BY MOUTH TWICE A DAY 03/14/17  Yes Sater, Nanine Means, MD  HYDROcodone-acetaminophen (NORCO) 7.5-325 MG per tablet Take 1 tablet by mouth 2 (two) times daily. Max APAP 3 GM IN 24 HOURS FROM ALL SOURCES 08/22/14  Yes Jordan Serve, MD  ibuprofen (ADVIL,MOTRIN) 200 MG tablet Take 200 mg by mouth every 6 (six) hours as needed for fever.   Yes [provider]  levofloxacin (LEVAQUIN) 500 MG tablet Take 500 mg by mouth daily.  05/25/17  Yes [provider]  levothyroxine (SYNTHROID, LEVOTHROID) 88 MCG tablet Take 88 mcg by mouth daily before breakfast.   Yes [provider]  potassium chloride SA (K-DUR,KLOR-CON) 20 MEQ tablet Take 20 mEq by mouth daily.   Yes Jordan Alberts, MD  solifenacin (VESICARE) 10 MG tablet Take 10 mg by mouth daily as needed (for spasms).    Yes [provider]  verapamil (CALAN) 40 MG tablet Take 40 mg by mouth 2 (two) times daily.   Yes [provider]     Vital Signs: BP (!) 142/68 (BP Location: Right Arm)   Pulse (!) 56   Temp 97.7 F (36.5 C) (Oral)   Resp 18   Ht 5\' 5"  (1.651 m)   Wt 136 lb 14.5 oz (62.1 kg)   SpO2 99%   BMI 22.78 kg/m   Physical Exam left PCN intact, dressing dry, site mildly tender, output 2 liters yellow urine  Imaging: Dg Chest Portable 1  View  Result Date: 05/26/2017 CLINICAL DATA:  Sepsis tachycardia EXAM: PORTABLE CHEST 1 VIEW COMPARISON:  08/03/2015, CT abdomen pelvis 05/25/2017 FINDINGS: Coarse bibasilar interstitial opacity left greater than right likely due to chronic interstitial change. No focal consolidation or effusion. Stable cardiomediastinal silhouette. Aortic atherosclerosis. No pneumothorax. IMPRESSION: Coarse left greater than right interstitial bibasilar opacity, likely due to fibrosis. No acute consolidation or effusion. Electronically Signed   By: [facility] M.D.   On: 05/26/2017 00:55   Ct Renal Stone Study  Result Date: 05/25/2017 CLINICAL DATA:  Left back pain with fever and hematuria EXAM: CT ABDOMEN AND PELVIS WITHOUT CONTRAST TECHNIQUE: Multidetector CT imaging of the abdomen and pelvis was performed following the standard protocol without IV contrast. COMPARISON:  11/01/2016, 11/20/2016, 08/23/2016, 08/04/2015 FINDINGS: Lower chest: Patchy dependent atelectasis. No focal consolidation or pleural effusion. Coronary artery calcification. Hepatobiliary: No focal hepatic abnormality. Surgical clips in the gallbladder fossa. Stable enlarged extrahepatic bile duct, measuring up to 15 mm. Hyperdense material or stones within the distal bile duct near the head of pancreas as before. Pancreas: Unremarkable. No pancreatic ductal dilatation or surrounding inflammatory changes. Spleen: Normal in size without focal abnormality. Adrenals/Urinary Tract: Adrenal glands are within normal limits. Atrophic right kidney with intrarenal calculi. 11 mm cyst anterior right kidney. 6 mm stone lower pole right kidney. No ureteral stone on the right. Severe hydronephrosis and hydroureter of the  left kidney, worse since prior CT. Increased left perinephric fat stranding. 6 mm stone in the distal left ureter, about 6 cm cephalad to the left the UVJ. Suprapubic catheter in the bladder which is decompressed. Bladder is thick walled. Single  large or multiple stones within the posterior aspect of the bladder measuring 3.1 cm transverse aggregate. Stomach/Bowel: Stomach is nonenlarged. No dilated small bowel. Status post appendectomy. No colon Corti thickening. Postsurgical changes of the distal colon. Vascular/Lymphatic: Moderate severe atherosclerotic calcification of the aorta. Severe narrowing and tapering of the distal abdominal aorta to minimum diameter of 9 mm AP with diminutive appearing iliac vessels. Retroperitoneal adenopathy with lymph nodes measuring up to 11 mm. Reproductive: No masses Other: Negative for free air or free fluid. Anterior abdominal [place] laxity with protrusion of intra-abdominal fat and bowel anteriorly. Musculoskeletal: Degenerative changes. No acute or suspicious bone lesion. IMPRESSION: 1. Severe left hydronephrosis and hydroureter, increased compared to prior CT. Worsens left perinephric fat stranding. 6 mm stone in the distal left ureter, about 6 cm cephalad to the left UVJ. 2. Atrophic right kidney containing a nonobstructing stone. Suprapubic catheter in place with decompressed bladder. Single large or multiple adjacent stones within the dependent portion of the bladder 3. Stable enlarged extrahepatic bile duct with hyperdense material or stones in the distal common duct as before consistent with choledocholithiasis. 4. Atherosclerotic vascular disease with severe stenosis of the distal abdominal aorta and aortic bifurcation with diminutive appearing iliac vessels. Electronically Signed   By: [facility] Ward M.D.   On: 05/25/2017 22:46   Ir Nephrostomy Placement Left  Result Date: 05/26/2017 INDICATION: 76 year old male with a functionally solitary left kidney, neurogenic bladder with chronic indwelling suprapubic catheter, an obstructed left hydronephrosis secondary to a distal ureteral stone. He is not a candidate for retrograde double-J ureteral stent placement given his neurogenic bladder and suprapubic catheter.  He has evidence of obstructed uropathy with increasing renal function as well as probable pyonephrosis. He requires urgent percutaneous nephrostomy tube for decompression. EXAM: IR NEPHROSTOMY PLACEMENT LEFT COMPARISON:  None. MEDICATIONS: 2 g Rocephin; The antibiotic was administered in an appropriate time frame prior to skin puncture. ANESTHESIA/SEDATION: Fentanyl 25 mcg IV; Versed 1.5 mg IV Moderate Sedation Time:  14 minutes The patient was continuously monitored during the procedure by the interventional radiology nurse under my direct supervision. CONTRAST:  10 mL Isovue 370 - administered into the collecting system(s) FLUOROSCOPY TIME:  Fluoroscopy Time: 0 minutes 24 seconds (3 mGy). COMPLICATIONS: None immediate. TECHNIQUE: The procedure, risks, benefits, and alternatives were explained to the patient. Questions regarding the procedure were encouraged and answered. The patient understands and consents to the procedure. The left flank was prepped with chlorhexidine in a sterile fashion, and a sterile drape was applied covering the operative field. A sterile gown and sterile gloves were used for the procedure. Local anesthesia was provided with 1% Lidocaine. The left flank was interrogated with ultrasound and the left kidney identified. The kidney is hydronephrotic. A suitable access site on the skin overlying the lower pole, posterior calix was identified. After local mg anesthesia was achieved, a small skin nick was made with an 11 blade scalpel. An 18 gauge trocar needle was then advanced under direct sonographic guidance into the lower pole of the left kidney. Gentle hand injection of contrast material confirms placement of the needle within the renal collecting system via a posterior inter pole calyx. There is marked hydronephrosis. A short 0.035 inch Amplatz wire was then advanced through the needle  and coiled in the renal collecting system. The needle was removed. The skin tract was dilated to 10 [place]  and a Cook 10.2 [place] all-purpose drainage catheter was advanced over the wire and positioned within the renal pelvis. The catheter was connected to gravity bag drainage and secured to the skin with 0 Prolene suture. Catheter was left to gravity bag drainage. IMPRESSION: Successful placement of a left 10 French percutaneous nephrostomy tube. Signed, Criselda Peaches, MD [facility] Radiology Electronically Signed   By: Jacqulynn Cadet M.D.   On: 05/26/2017 15:06    Labs:  CBC:  Recent Labs  08/27/16 0511 11/01/16 2024 05/25/17 2112 05/26/17 0421  WBC 7.4 14.2* 12.9* 14.9*  HGB 12.5* 15.0 14.2 12.5*  HCT 37.6* 45.0 42.7 37.0*  PLT 109* 208 162 140*    COAGS:  Recent Labs  08/25/16 0441 05/26/17 0922  INR 1.06 1.17    BMP:  Recent Labs  11/01/16 2024 05/25/17 2112 05/25/17 2316 05/26/17 0421  NA 143 141 141 142  K 4.0 4.5 4.4 4.5  CL 109 108 111 111  CO2 26 23 22  21*  GLUCOSE 103* 134* 152* 128*  BUN 28* 53* 57* 62*  CALCIUM 9.3 9.4 8.7* 8.7*  CREATININE 1.72* 3.85* 3.91* 4.24*  GFRNONAA 38* 14* 14* 13*  GFRAA 44* 16* 16* 15*    LIVER FUNCTION TESTS:  Recent Labs  05/25/17 2316  BILITOT 0.8  AST 24  ALT 56  ALKPHOS 104  PROT 7.0  ALBUMIN 3.6    Assessment and Plan: 76 year old male with a functionally solitary left kidney,neurogenic bladder with chronic indwelling suprapubic catheter, and obstructed left hydronephrosis secondary to a distal ureteral stone; s/p left PCN 8/28; AF; blood cx neg to date; labs pending today; additional plans per urology; tube check and change at 6 week intervals otherwise.   Electronically Signed: D. Rowe Robert, PA-C 05/27/2017, 9:22 AM   I spent a total of 15 minutes at the the patient's bedside AND on the patient's hospital floor or unit, greater than 50% of which was counseling/coordinating care for left nephrostomy    Patient ID: Jordan Ward, male   DOB:  07/10/1941, 75 y.o.   MRN: [id number]

## 2017-05-27 NOTE — Care Management Note (Signed)
Case Management Note  Patient Details  Name: Jordan Ward MRN: [id number] Date of Birth: [birth year]  Subjective/Objective:76 y/o m admitted w/obstructive uropathy. From home. POD#1 L PCN.                     Action/Plan:d/c plan home.   Expected Discharge Date:  05/28/17               Expected Discharge Plan:  Home/Self Care  In-House Referral:     Discharge planning Services  CM Consult  Post Acute Care Choice:    Choice offered to:     DME Arranged:    DME Agency:     HH Arranged:    HH Agency:     Status of Service:  In process, will continue to follow  If discussed at Long Length of Stay Meetings, dates discussed:    Additional Comments:  Dessa Phi, RN 05/27/2017, 11:31 AM

## 2017-05-28 LAB — URINE CULTURE: Culture: NO GROWTH

## 2017-05-28 NOTE — Progress Notes (Signed)
Subjective: Jordan Ward has a left distal stone with obstruction and was admitted for ARI and fever.  His right kidney is atrophic.  He continues to have some left flank pain that is unchanged but he is afebrile and his WBC count and Cr are falling.    ROS:  Review of Systems  Constitutional: Negative for chills and fever.  Respiratory: Negative for shortness of breath.   Cardiovascular: Negative for chest pain.    Anti-infectives: Anti-infectives    Start     Dose/Rate Route Frequency Ordered Stop   05/26/17 2200  cefTRIAXone (ROCEPHIN) 1 g in dextrose 5 % 50 mL IVPB     1 g 100 mL/hr over 30 Minutes Intravenous Every 24 hours 05/26/17 0241     05/26/17 0954  cefTRIAXone (ROCEPHIN) 2 g in dextrose 5 % 50 mL IVPB     2 g 100 mL/hr over 30 Minutes Intravenous 30 min pre-op 05/26/17 0950 05/26/17 1140   05/25/17 2330  cefTRIAXone (ROCEPHIN) 2 g in dextrose 5 % 50 mL IVPB     2 g 100 mL/hr over 30 Minutes Intravenous  Once 05/25/17 2323 05/26/17 0002      Current Facility-Administered Medications  Medication Dose Route Frequency Provider Last Rate Last Dose  . 0.45 % NaCl with KCl 20 mEq / L infusion   Intravenous Continuous Irine Seal, MD 100 mL/hr at 05/28/17 [phone]    . acetaminophen (TYLENOL) tablet 650 mg  650 mg Oral Q4H PRN Irine Seal, MD      . bisacodyl (DULCOLAX) suppository 10 mg  10 mg Rectal Daily PRN Irine Seal, MD      . carvedilol (COREG) tablet 6.25 mg  6.25 mg Oral BID WC Irine Seal, MD   6.25 mg at 05/28/17 1191  . cefTRIAXone (ROCEPHIN) 1 g in dextrose 5 % 50 mL IVPB  1 g Intravenous Q24H Irine Seal, MD   Stopped at 05/27/17 2135  . darifenacin (ENABLEX) 24 hr tablet 15 mg  15 mg Oral Daily Irine Seal, MD   15 mg at 05/28/17 0916  . diazepam (VALIUM) tablet 5 mg  5 mg Oral BID Irine Seal, MD   5 mg at 05/28/17 4782  . HYDROcodone-acetaminophen (NORCO) 7.5-325 MG per tablet 1 tablet  1 tablet Oral BID Irine Seal, MD   1 tablet at 05/28/17 [phone]  .  HYDROmorphone (DILAUDID) injection 0.5-1 mg  0.5-1 mg Intravenous Q2H PRN Irine Seal, MD   1 mg at 05/27/17 1620  . levothyroxine (SYNTHROID, LEVOTHROID) tablet 88 mcg  88 mcg Oral QAC breakfast Irine Seal, MD   88 mcg at 05/28/17 1308  . ondansetron (ZOFRAN) injection 4 mg  4 mg Intravenous Q4H PRN Irine Seal, MD      . potassium chloride SA (K-DUR,KLOR-CON) CR tablet 20 mEq  20 mEq Oral Daily Irine Seal, MD   20 mEq at 05/28/17 0917  . senna-docusate (Senokot-S) tablet 1 tablet  1 tablet Oral QHS PRN Irine Seal, MD      . sodium chloride flush (NS) 0.9 % injection 5 mL  5 mL Intravenous Q8H Jordan Cadet, MD   5 mL at 05/27/17 1246  . sodium phosphate (FLEET) 7-19 GM/118ML enema 1 enema  1 enema Rectal Once PRN Irine Seal, MD      . verapamil (CALAN) tablet 40 mg  40 mg Oral BID Irine Seal, MD   40 mg at 05/28/17 6578     Objective: Vital signs in last 24 hours: Temp:  [  97.9 F (36.6 C)-98.6 F (37 C)] 97.9 F (36.6 C) (08/30 0454) Pulse Rate:  [55-58] 58 (08/30 0454) Resp:  [18-19] 19 (08/30 0454) BP: (117-146)/(61-88) 146/88 (08/30 0454) SpO2:  [92 %-95 %] 95 % (08/30 0454)  Intake/Output from previous day: 08/29 0701 - 08/30 0700 In: 1220 [P.O.:120; I.V.:1100] Out: 3100 [Urine:3100] Intake/Output this shift: Total I/O In: 360 [P.O.:360] Out: 600 [Urine:600]   Physical Exam  Constitutional: He is well-developed, well-nourished, and in no distress.  Cardiovascular: Normal rate and regular rhythm.   Pulmonary/Chest: Effort normal. No respiratory distress.  Abdominal: Soft. There is tenderness (LUQ).  Vitals reviewed.   Lab Results:   Recent Labs  05/26/17 0421 05/27/17 0926  WBC 14.9* 10.9*  HGB 12.5* 12.5*  HCT 37.0* 37.5*  PLT 140* 139*   BMET  Recent Labs  05/26/17 0421 05/27/17 0926  NA 142 139  K 4.5 4.0  CL 111 108  CO2 21* 24  GLUCOSE 128* 123*  BUN 62* 47*  CREATININE 4.24* 3.25*  CALCIUM 8.7* 8.5*   PT/INR  Recent Labs   05/26/17 0922  LABPROT 14.8  INR 1.17   ABG No results for input(s): PHART, HCO3 in the last 72 hours.  Invalid input(s): PCO2, PO2  Studies/Results: Ir Nephrostomy Placement Left  Result Date: 05/26/2017 INDICATION: 76 year old male with a functionally solitary left kidney, neurogenic bladder with chronic indwelling suprapubic catheter, an obstructed left hydronephrosis secondary to a distal ureteral stone. He is not a candidate for retrograde double-J ureteral stent placement given his neurogenic bladder and suprapubic catheter. He has evidence of obstructed uropathy with increasing renal function as well as probable pyonephrosis. He requires urgent percutaneous nephrostomy tube for decompression. EXAM: IR NEPHROSTOMY PLACEMENT LEFT COMPARISON:  None. MEDICATIONS: 2 g Rocephin; The antibiotic was administered in an appropriate time frame prior to skin puncture. ANESTHESIA/SEDATION: Fentanyl 25 mcg IV; Versed 1.5 mg IV Moderate Sedation Time:  14 minutes The patient was continuously monitored during the procedure by the interventional radiology nurse under my direct supervision. CONTRAST:  10 mL Isovue 370 - administered into the collecting system(s) FLUOROSCOPY TIME:  Fluoroscopy Time: 0 minutes 24 seconds (3 mGy). COMPLICATIONS: None immediate. TECHNIQUE: The procedure, risks, benefits, and alternatives were explained to the patient. Questions regarding the procedure were encouraged and answered. The patient understands and consents to the procedure. The left flank was prepped with chlorhexidine in a sterile fashion, and a sterile drape was applied covering the operative field. A sterile gown and sterile gloves were used for the procedure. Local anesthesia was provided with 1% Lidocaine. The left flank was interrogated with ultrasound and the left kidney identified. The kidney is hydronephrotic. A suitable access site on the skin overlying the lower pole, posterior calix was identified. After local  mg anesthesia was achieved, a small skin nick was made with an 11 blade scalpel. An 18 gauge trocar needle was then advanced under direct sonographic guidance into the lower pole of the left kidney. Gentle hand injection of contrast material confirms placement of the needle within the renal collecting system via a posterior inter pole calyx. There is marked hydronephrosis. A short 0.035 inch Amplatz wire was then advanced through the needle and coiled in the renal collecting system. The needle was removed. The skin tract was dilated to 10 [place] and a Cook 10.2 [place] all-purpose drainage catheter was advanced over the wire and positioned within the renal pelvis. The catheter was connected to gravity bag drainage and secured to the skin with  0 Prolene suture. Catheter was left to gravity bag drainage. IMPRESSION: Successful placement of a left 10 French percutaneous nephrostomy tube. Signed, Jordan Peaches, MD [facility]iology Electronically Signed   By: Jordan Ward M.D.   On: 05/26/2017 15:06   Labs and cultures reviewed.   Assessment and Plan: Left ureteral stone with obstruction, fever and ARI improving post left NT placement.  Blood cultures are negative.  Urine culture from NT pending.   Continue current therapy      LOS: 2 days    Jordan Ward 05/28/2017 [phone] ID: Jordan Ward, male   DOB: May 07, 1941, 76 y.o.   MRN: [id number]

## 2017-05-29 LAB — BASIC METABOLIC PANEL
ANION GAP: 7 (ref 5–15)
BUN: 33 mg/dL — ABNORMAL HIGH (ref 6–20)
CALCIUM: 8 mg/dL — AB (ref 8.9–10.3)
CO2: 23 mmol/L (ref 22–32)
Chloride: 109 mmol/L (ref 101–111)
Creatinine, Ser: 1.81 mg/dL — ABNORMAL HIGH (ref 0.61–1.24)
GFR calc non Af Amer: 35 mL/min — ABNORMAL LOW (ref >60)
GFR, EST AFRICAN AMERICAN: 41 mL/min — AB (ref >60)
GLUCOSE: 107 mg/dL — AB (ref 65–99)
POTASSIUM: 3.8 mmol/L (ref 3.5–5.1)
Sodium: 139 mmol/L (ref 135–145)

## 2017-05-29 LAB — CBC WITH DIFFERENTIAL/PLATELET
BASOS ABS: 0 10*3/uL (ref 0.0–0.1)
BASOS PCT: 0 %
Eosinophils Absolute: 0.6 10*3/uL (ref 0.0–0.7)
Eosinophils Relative: 8 %
HEMATOCRIT: 36.1 % — AB (ref 39.0–52.0)
HEMOGLOBIN: 12.4 g/dL — AB (ref 13.0–17.0)
LYMPHS PCT: 34 %
Lymphs Abs: 2.4 10*3/uL (ref 0.7–4.0)
MCH: 31 pg (ref 26.0–34.0)
MCHC: 34.3 g/dL (ref 30.0–36.0)
MCV: 90.3 fL (ref 78.0–100.0)
Monocytes Absolute: 0.3 10*3/uL (ref 0.1–1.0)
Monocytes Relative: 5 %
NEUTROS ABS: 3.7 10*3/uL (ref 1.7–7.7)
Neutrophils Relative %: 53 %
Platelets: 157 10*3/uL (ref 150–400)
RBC: 4 MIL/uL — AB (ref 4.22–5.81)
RDW: 13.7 % (ref 11.5–15.5)
WBC: 7 10*3/uL (ref 4.0–10.5)

## 2017-05-29 NOTE — Care Management Important Message (Signed)
Important Message  Patient Details  Name: PIERCE BAROCIO MRN: [id number] Date of Birth: Aug 12, 1941   Medicare Important Message Given:  Yes    Kerin Salen 05/29/2017, 10:41 AMImportant Message  Patient Details  Name: JACQUES FIFE MRN: [id number] Date of Birth: August 02, 1941   Medicare Important Message Given:  Yes    Kerin Salen 05/29/2017, 10:41 AM

## 2017-05-29 NOTE — Progress Notes (Signed)
Patient given discharge, medication and follow up instructions, verbalized understanding, IV x 2 and telemetry removed, waiting on family to arrive to transport home

## 2017-05-29 NOTE — Progress Notes (Signed)
  Subjective: Patient reports less flank pain  Objective: Vital signs in last 24 hours: Temp:  [97.8 F (36.6 C)-99.4 F (37.4 C)] 97.8 F (36.6 C) (08/31 0516) Pulse Rate:  [55-66] 55 (08/31 0516) Resp:  [18] 18 (08/31 0516) BP: (154-165)/(84-86) 160/86 (08/31 0516) SpO2:  [94 %-98 %] 98 % (08/31 0516)  Intake/Output from previous day: 08/30 0701 - 08/31 0700 In: 4151.7 [P.O.:420; I.V.:3631.7; IV Piggyback:100] Out: 1497 [Urine:4150] Intake/Output this shift: No intake/output data recorded.  Physical Exam:  Constitutional: Vital signs reviewed. WD WN in NAD   Eyes: PERRL, No scleral icterus.   Cardiovascular: RRR Pulmonary/Chest: Normal effort   Lab Results:  Recent Labs  05/27/17 0926 05/29/17 0451  HGB 12.5* 12.4*  HCT 37.5* 36.1*   BMET  Recent Labs  05/27/17 0926 05/29/17 0451  NA 139 139  K 4.0 3.8  CL 108 109  CO2 24 23  GLUCOSE 123* 107*  BUN 47* 33*  CREATININE 3.25* 1.81*  CALCIUM 8.5* 8.0*    Recent Labs  05/26/17 0922  INR 1.17   No results for input(s): LABURIN in the last 72 hours. Results for orders placed or performed during the hospital encounter of 05/25/17  Blood Culture (routine x 2)     Status: None (Preliminary result)   Collection Time: 05/25/17 11:05 PM  Result Value Ref Range Status   Specimen Description BLOOD RIGHT ARM  Final   Special Requests   Final    BOTTLES DRAWN AEROBIC AND ANAEROBIC Blood Culture adequate volume   Culture NO GROWTH 4 DAYS  Final   Report Status PENDING  Incomplete  Blood Culture (routine x 2)     Status: None (Preliminary result)   Collection Time: 05/25/17 11:16 PM  Result Value Ref Range Status   Specimen Description BLOOD LEFT HAND  Final   Special Requests   Final    BOTTLES DRAWN AEROBIC AND ANAEROBIC Blood Culture adequate volume   Culture NO GROWTH 4 DAYS  Final   Report Status PENDING  Incomplete  Urine Culture     Status: None   Collection Time: 05/27/17  9:31 AM  Result Value  Ref Range Status   Specimen Description URINE, RANDOM  Final   Special Requests NONE  Final   Culture   Final    NO GROWTH Performed at [facility], [place] [address] [place], [place] [address]    Report Status 05/28/2017 FINAL  Final    Studies/Results: No results found.  Assessment/Plan:   S/p pcn tube placement for obstructing,infected left ureteral stone.  Cont abx   LOS: 3 days   Franchot Gallo M 05/29/2017, 7:10 AM

## 2017-05-29 NOTE — Care Management Note (Signed)
Case Management Note  Patient Details  Name: Jordan Ward MRN: [id number] Date of Birth: 08-Aug-1941  Subjective/Objective:                    Action/Plan:d/c plan home.   Expected Discharge Date:  05/29/17               Expected Discharge Plan:  Home/Self Care  In-House Referral:     Discharge planning Services  CM Consult  Post Acute Care Choice:    Choice offered to:     DME Arranged:    DME Agency:     HH Arranged:    HH Agency:     Status of Service:  Completed, signed off  If discussed at [employer] of Stay Meetings, dates discussed:    Additional Comments:  Dessa Phi, RN 05/29/2017, 9:51 AM

## 2017-05-30 LAB — CULTURE, BLOOD (ROUTINE X 2)
CULTURE: NO GROWTH
Culture: NO GROWTH
Special Requests: ADEQUATE
Special Requests: ADEQUATE

## 2017-06-04 ENCOUNTER — Other Ambulatory Visit: Payer: Self-pay | Admitting: Urology

## 2017-06-04 ENCOUNTER — Encounter (HOSPITAL_COMMUNITY)
Admission: RE | Admit: 2017-06-04 | Discharge: 2017-06-04 | Disposition: A | Discharge status: Home / Self Care | Payer: Medicare Other | Source: Ambulatory Visit | Attending: Urology | Admitting: Urology

## 2017-06-06 NOTE — Discharge Summary (Signed)
Patient ID: Jordan Ward MRN: [id number] DOB/AGE: [birth year] 76 y.o.  Admit date: 05/25/2017 Discharge date: 06/06/2017  Primary Care Physician:  Sinda Du, MD  Discharge Diagnoses:   Present on Admission: . Obstructive uropathy . Hydronephrosis due to obstruction of ureter urinary tract infection  Consults:  none   Discharge Medications: Allergies as of 05/29/2017      Reactions   Tetracyclines & Related Anaphylaxis, Rash   Ciprofloxacin    Trouble swallowing unknown reaction according to wife       Medication List    TAKE these medications   carvedilol 6.25 MG tablet Commonly known as:  COREG Take 1 tablet (6.25 mg total) by mouth 2 (two) times daily with a meal.   diazepam 5 MG tablet Commonly known as:  VALIUM TAKE 1 TABLET BY MOUTH TWICE A DAY   HYDROcodone-acetaminophen 7.5-325 MG tablet Commonly known as:  NORCO Take 1 tablet by mouth 2 (two) times daily. Max APAP 3 GM IN 24 HOURS FROM ALL SOURCES   ibuprofen 200 MG tablet Commonly known as:  ADVIL,MOTRIN Take 200 mg by mouth every 6 (six) hours as needed for fever.   levofloxacin 500 MG tablet Commonly known as:  LEVAQUIN Take 500 mg by mouth daily.   levothyroxine 88 MCG tablet Commonly known as:  SYNTHROID, LEVOTHROID Take 88 mcg by mouth daily before breakfast.   potassium chloride SA 20 MEQ tablet Commonly known as:  K-DUR,KLOR-CON Take 20 mEq by mouth daily.   solifenacin 10 MG tablet Commonly known as:  VESICARE Take 10 mg by mouth daily as needed (for spasms).   verapamil 40 MG tablet Commonly known as:  CALAN Take 40 mg by mouth 2 (two) times daily.        Significant Diagnostic Studies:  No results found.  Brief H and P: For complete details please refer to admission H and P, but in brief this 76 year old is admitted for urgent decompression of his left renal unit secondary to obstruction/urinary tract infection.  He has a left distal ureteral stone which will eventually need  treatment.  He has a suprapubic tube for neurogenic bladder management.  Hospital Course:  Active Problems:   Obstructive uropathy   Pressure injury of skin   Hydronephrosis due to obstruction of ureter the patient was admitted to [facility], transferred from [facility] due to the need for interventional radiology management with a percutaneous nephrostomy tube.  That was performed on 05/26/2017.  The patient tolerated this well.  Nephrostomy tube drained without difficulty, and the patient defervesced.  By 8/31, he was eating regular diet, afebrile, and was properly managed with antibiotics, which he was discharged with.  Day of Discharge BP (!) 160/86 (BP Location: Left Arm)   Pulse (!) 55   Temp 97.8 F (36.6 C) (Oral)   Resp 18   Ht 5\' 5"  (1.651 m)   Wt 62.1 kg (136 lb 14.5 oz)   SpO2 98%   BMI 22.78 kg/m   No results found for this or any previous visit (from the past 24 hour(s)).  Physical Exam: General: Alert and awake oriented x3 not in any acute distress. HEENT: anicteric sclera, pupils reactive to light and accommodation CVS: S1-S2 clear no murmur rubs or gallops Chest: clear to auscultation bilaterally, no wheezing rales or rhonchi Abdomen: soft nontender, nondistended, normal bowel sounds, no organomegaly Extremities: no cyanosis, clubbing or edema noted bilaterally Neuro: Cranial nerves II-XII intact, no focal neurological deficits  Disposition:  home  Diet:  regular  Activity:  No restrictions   Disposition and Follow-up:    We will arrange for follow-up/left ureteroscopic stone extraction, to be performed at [place]  none  DISCHARGE FOLLOW-UP Follow-up Information    Franchot Gallo, MD Follow up.   Specialty:  Urology Why:  we will call to schedule surgery Contact information: [address] [place] [address] [phone]           Time spent on Discharge:  15  minutes  Signed: [name] 06/06/2017, 7:29 PM

## 2017-06-09 ENCOUNTER — Encounter (HOSPITAL_COMMUNITY)
Admission: RE | Disposition: A | Discharge status: Home / Self Care | Payer: Self-pay | Source: Ambulatory Visit | Attending: Urology

## 2017-06-09 ENCOUNTER — Ambulatory Visit (HOSPITAL_COMMUNITY): Payer: Medicare Other

## 2017-06-09 ENCOUNTER — Ambulatory Visit (HOSPITAL_COMMUNITY)
Admission: RE | Admit: 2017-06-09 | Discharge: 2017-06-09 | Disposition: A | Discharge status: Home / Self Care | Payer: Medicare Other | Source: Ambulatory Visit | Attending: Urology | Admitting: Urology

## 2017-06-09 ENCOUNTER — Ambulatory Visit (HOSPITAL_COMMUNITY): Payer: Medicare Other | Admitting: Anesthesiology

## 2017-06-09 ENCOUNTER — Encounter (HOSPITAL_COMMUNITY): Payer: Self-pay | Admitting: *Deleted

## 2017-06-09 DIAGNOSIS — Z809 Family history of malignant neoplasm, unspecified: Secondary | ICD-10-CM | POA: Diagnosis not present

## 2017-06-09 DIAGNOSIS — Z8379 Family history of other diseases of the digestive system: Secondary | ICD-10-CM | POA: Diagnosis not present

## 2017-06-09 DIAGNOSIS — I2510 Atherosclerotic heart disease of native coronary artery without angina pectoris: Secondary | ICD-10-CM | POA: Diagnosis not present

## 2017-06-09 DIAGNOSIS — Z8249 Family history of ischemic heart disease and other diseases of the circulatory system: Secondary | ICD-10-CM | POA: Diagnosis not present

## 2017-06-09 DIAGNOSIS — I739 Peripheral vascular disease, unspecified: Secondary | ICD-10-CM | POA: Diagnosis not present

## 2017-06-09 DIAGNOSIS — G4733 Obstructive sleep apnea (adult) (pediatric): Secondary | ICD-10-CM | POA: Insufficient documentation

## 2017-06-09 DIAGNOSIS — Z435 Encounter for attention to cystostomy: Secondary | ICD-10-CM | POA: Diagnosis not present

## 2017-06-09 DIAGNOSIS — Z881 Allergy status to other antibiotic agents status: Secondary | ICD-10-CM | POA: Insufficient documentation

## 2017-06-09 DIAGNOSIS — J8410 Pulmonary fibrosis, unspecified: Secondary | ICD-10-CM | POA: Diagnosis not present

## 2017-06-09 DIAGNOSIS — E785 Hyperlipidemia, unspecified: Secondary | ICD-10-CM | POA: Diagnosis not present

## 2017-06-09 DIAGNOSIS — I10 Essential (primary) hypertension: Secondary | ICD-10-CM | POA: Diagnosis not present

## 2017-06-09 DIAGNOSIS — Z87891 Personal history of nicotine dependence: Secondary | ICD-10-CM | POA: Insufficient documentation

## 2017-06-09 DIAGNOSIS — G35 Multiple sclerosis: Secondary | ICD-10-CM | POA: Insufficient documentation

## 2017-06-09 DIAGNOSIS — N201 Calculus of ureter: Secondary | ICD-10-CM | POA: Diagnosis not present

## 2017-06-09 DIAGNOSIS — Z8673 Personal history of transient ischemic attack (TIA), and cerebral infarction without residual deficits: Secondary | ICD-10-CM | POA: Diagnosis not present

## 2017-06-09 DIAGNOSIS — Z823 Family history of stroke: Secondary | ICD-10-CM | POA: Insufficient documentation

## 2017-06-09 DIAGNOSIS — Z79899 Other long term (current) drug therapy: Secondary | ICD-10-CM | POA: Diagnosis not present

## 2017-06-09 DIAGNOSIS — E039 Hypothyroidism, unspecified: Secondary | ICD-10-CM | POA: Insufficient documentation

## 2017-06-09 HISTORY — PX: INSERTION OF SUPRAPUBIC CATHETER: SHX5870

## 2017-06-09 HISTORY — PX: CYSTOSCOPY WITH RETROGRADE PYELOGRAM, URETEROSCOPY AND STENT PLACEMENT: SHX5789

## 2017-06-09 SURGERY — CYSTOURETEROSCOPY, WITH RETROGRADE PYELOGRAM AND STENT INSERTION
Anesthesia: General | Laterality: Left

## 2017-06-09 MED ORDER — LACTATED RINGERS IV SOLN
INTRAVENOUS | Status: DC
  Administered 2017-06-09: 12:00:00 via INTRAVENOUS

## 2017-06-09 MED ORDER — FENTANYL CITRATE (PF) 100 MCG/2ML IJ SOLN
INTRAMUSCULAR | Status: DC | PRN
  Administered 2017-06-09 (×2): 50 ug via INTRAVENOUS

## 2017-06-09 MED ORDER — SULFAMETHOXAZOLE-TRIMETHOPRIM 800-160 MG PO TABS: 1.0000 | ORAL_TABLET | Freq: Two times a day (BID) | ORAL | 0 refills | Status: DC

## 2017-06-09 MED ORDER — PROPOFOL 10 MG/ML IV BOLUS
INTRAVENOUS | Status: AC
  Filled 2017-06-09: qty 20

## 2017-06-09 MED ORDER — ONDANSETRON HCL 4 MG/2ML IJ SOLN
INTRAMUSCULAR | Status: AC
  Filled 2017-06-09: qty 2

## 2017-06-09 MED ORDER — MIDAZOLAM HCL 2 MG/2ML IJ SOLN
1.0000 mg | INTRAMUSCULAR | Status: AC
  Administered 2017-06-09: 2 mg via INTRAVENOUS

## 2017-06-09 MED ORDER — LACTATED RINGERS IV SOLN
INTRAVENOUS | Status: DC | PRN
  Administered 2017-06-09: 12:00:00 via INTRAVENOUS

## 2017-06-09 MED ORDER — PROPOFOL 10 MG/ML IV BOLUS
INTRAVENOUS | Status: DC | PRN
  Administered 2017-06-09: 120 mg via INTRAVENOUS

## 2017-06-09 MED ORDER — FENTANYL CITRATE (PF) 100 MCG/2ML IJ SOLN: 25.0000 ug | INTRAMUSCULAR | Status: DC | PRN

## 2017-06-09 MED ORDER — DIATRIZOATE MEGLUMINE 30 % UR SOLN
URETHRAL | Status: DC | PRN
  Administered 2017-06-09: 10 mL

## 2017-06-09 MED ORDER — SODIUM CHLORIDE 0.9 % IR SOLN
Status: DC | PRN
  Administered 2017-06-09: 3000 mL
  Administered 2017-06-09: 3000 mL via INTRAVESICAL

## 2017-06-09 MED ORDER — EPHEDRINE SULFATE 50 MG/ML IJ SOLN
INTRAMUSCULAR | Status: AC
  Filled 2017-06-09: qty 1

## 2017-06-09 MED ORDER — FENTANYL CITRATE (PF) 100 MCG/2ML IJ SOLN
INTRAMUSCULAR | Status: AC
  Filled 2017-06-09: qty 2

## 2017-06-09 MED ORDER — STERILE WATER FOR IRRIGATION IR SOLN
Status: DC | PRN
Start: 2017-06-09 — End: 2017-06-09
  Administered 2017-06-09: 500 mL

## 2017-06-09 MED ORDER — ONDANSETRON HCL 4 MG/2ML IJ SOLN
4.0000 mg | Freq: Once | INTRAMUSCULAR | Status: AC
  Administered 2017-06-09: 4 mg via INTRAVENOUS

## 2017-06-09 MED ORDER — SODIUM CHLORIDE 0.9 % IJ SOLN
INTRAMUSCULAR | Status: AC
  Filled 2017-06-09: qty 10

## 2017-06-09 MED ORDER — DIATRIZOATE MEGLUMINE 30 % UR SOLN
URETHRAL | Status: AC
  Filled 2017-06-09: qty 300

## 2017-06-09 MED ORDER — GENTAMICIN SULFATE 40 MG/ML IJ SOLN
5.0000 mg/kg | INTRAVENOUS | Status: AC
  Administered 2017-06-09: 310 mg via INTRAVENOUS
  Filled 2017-06-09: qty 7.75

## 2017-06-09 MED ORDER — MIDAZOLAM HCL 2 MG/2ML IJ SOLN
INTRAMUSCULAR | Status: AC
  Filled 2017-06-09: qty 2

## 2017-06-09 SURGICAL SUPPLY — 30 items
BAG HAMPER (MISCELLANEOUS) ×2 IMPLANT
BAG URINE DRAINAGE (UROLOGICAL SUPPLIES) ×4 IMPLANT
CATH FOLEY 2WAY SLVR  5CC 24FR (CATHETERS) ×2
CATH FOLEY 2WAY SLVR 5CC 24FR (CATHETERS) IMPLANT
CATH INTERMIT  6FR 70CM (CATHETERS) ×4 IMPLANT
CLOTH BEACON ORANGE TIMEOUT ST (SAFETY) ×4 IMPLANT
DECANTER SPIKE VIAL GLASS SM (MISCELLANEOUS) ×4 IMPLANT
DRSG TEGADERM 4X4.75 (GAUZE/BANDAGES/DRESSINGS) ×2 IMPLANT
EXTRACTOR STONE NITINOL NGAGE (UROLOGICAL SUPPLIES) IMPLANT
GAUZE SPONGE 4X4 12PLY STRL (GAUZE/BANDAGES/DRESSINGS) ×2 IMPLANT
GLOVE BIO SURGEON STRL SZ8 (GLOVE) ×4 IMPLANT
GLOVE BIOGEL M 7.0 STRL (GLOVE) ×2 IMPLANT
GLOVE BIOGEL PI IND STRL 7.0 (GLOVE) ×2 IMPLANT
GLOVE BIOGEL PI INDICATOR 7.0 (GLOVE) ×4
GOWN STRL REUS W/ TWL XL LVL3 (GOWN DISPOSABLE) ×2 IMPLANT
GOWN STRL REUS W/TWL LRG LVL3 (GOWN DISPOSABLE) ×4 IMPLANT
GOWN STRL REUS W/TWL XL LVL3 (GOWN DISPOSABLE) ×4
GUIDEWIRE STR DUAL SENSOR (WIRE) ×2 IMPLANT
GUIDEWIRE STR ZIPWIRE 035X150 (MISCELLANEOUS) ×4 IMPLANT
IV NS IRRIG 3000ML ARTHROMATIC (IV SOLUTION) ×8 IMPLANT
KIT ROOM TURNOVER AP CYSTO (KITS) ×4 IMPLANT
MANIFOLD NEPTUNE II (INSTRUMENTS) ×2 IMPLANT
PACK CYSTO (CUSTOM PROCEDURE TRAY) ×4 IMPLANT
PAD ARMBOARD 7.5X6 YLW CONV (MISCELLANEOUS) ×4 IMPLANT
SPONGE GAUZE 2X2 8PLY STER LF (GAUZE/BANDAGES/DRESSINGS) ×1
SPONGE GAUZE 2X2 8PLY STRL LF (GAUZE/BANDAGES/DRESSINGS) ×1 IMPLANT
STENT URET 6FRX24 CONTOUR (STENTS) ×2 IMPLANT
SYRINGE 10CC LL (SYRINGE) ×4 IMPLANT
TAPE CLOTH SURG 4X10 WHT LF (GAUZE/BANDAGES/DRESSINGS) ×2 IMPLANT
TOWEL OR 17X26 4PK STRL BLUE (TOWEL DISPOSABLE) ×4 IMPLANT

## 2017-06-09 NOTE — Transfer of Care (Signed)
Immediate Anesthesia Transfer of Care Note  Patient: Jordan Ward  Procedure(s) Performed: Procedure(s) with comments: CYSTOSCOPY WITH LEFT RETROGRADE PYELOGRAM, URETEROSCOPY AND STENT PLACEMENT (Left) - 1 HR [phone] MEDICARE-[id number] A HOLMIUM LASER LITHOTRIPSY (Left)  Patient Location: PACU  Anesthesia Type:General  Level of Consciousness: awake, alert , oriented and patient cooperative  Airway & Oxygen Therapy: Patient Spontanous Breathing and Patient connected to nasal cannula oxygen  Post-op Assessment: Report given to RN and Post -op Vital signs reviewed and stable  Post vital signs: Reviewed and stable  Last Vitals:  Vitals:   06/09/17 1102  BP: (!) 158/94  Pulse: 64  Resp: 18  Temp: 37 C  SpO2: 96%    Last Pain:  Vitals:   06/09/17 1102  TempSrc: Oral  PainSc: 6       Patients Stated Pain Goal: 4 (93/57/01 7793)  Complications: No apparent anesthesia complications

## 2017-06-09 NOTE — Interval H&P Note (Signed)
History and Physical Interval Note:  06/09/2017 12:04 PM  Jordan Ward  has presented today for surgery, with the diagnosis of LEFT URETERAL STONE  The various methods of treatment have been discussed with the patient and family. After consideration of risks, benefits and other options for treatment, the patient has consented to  Procedure(s) with comments: CYSTOSCOPY WITH LEFT RETROGRADE PYELOGRAM, URETEROSCOPY AND STENT PLACEMENT (Left) - 1 HR [phone] MEDICARE-[id number] A HOLMIUM LASER LITHOTRIPSY (Left) as a surgical intervention .  The patient's history has been reviewed, patient examined, no change in status, stable for surgery.  I have reviewed the patient's chart and labs.  Questions were answered to the patient's satisfaction.     [name]

## 2017-06-09 NOTE — Discharge Instructions (Signed)
1. You may see some blood in the urine and may have some burning with urination for 48-72 hours. You also may notice that you have to urinate more frequently or urgently after your procedure which is normal.  2. You should call should you develop an inability urinate, fever > 101, persistent nausea and vomiting that prevents you from eating or drinking to stay hydrated.  3. If you have a stent, you will likely urinate more frequently and urgently until the stent is removed and you may experience some discomfort/pain in the lower abdomen and flank especially when urinating. You may take pain medication prescribed to you if needed for pain. You may also intermittently have blood in the urine until the stent is removed. 4. If you have a catheter, you will be taught how to take care of the catheter by the nursing staff prior to discharge from the hospital.  You may periodically feel a strong urge to void with the catheter in place.  This is a bladder spasm and most often can occur when having a bowel movement or moving around. It is typically self-limited and usually will stop after a few minutes.  You may use some Vaseline or Neosporin around the tip of the catheter to reduce friction at the tip of the penis. You may also see some blood in the urine.  A very small amount of blood can make the urine look quite red.  As long as the catheter is draining well, there usually is not a problem.  However, if the catheter is not draining well and is bloody, you should call the office [phone]) to notify us.  PATIENT INSTRUCTIONS POST-ANESTHESIA  IMMEDIATELY FOLLOWING SURGERY:  Do not drive or operate machinery for the first twenty four hours after surgery.  Do not make any important decisions for twenty four hours after surgery or while taking narcotic pain medications or sedatives.  If you develop intractable nausea and vomiting or a severe headache please notify your doctor immediately.  FOLLOW-UP:  Please make  an appointment with your surgeon as instructed. You do not need to follow up with anesthesia unless specifically instructed to do so.  WOUND CARE INSTRUCTIONS (if applicable):  Keep a dry clean dressing on the anesthesia/puncture wound site if there is drainage.  Once the wound has quit draining you may leave it open to air.  Generally you should leave the bandage intact for twenty four hours unless there is drainage.  If the epidural site drains for more than 36-48 hours please call the anesthesia department.  QUESTIONS?:  Please feel free to call your physician or the hospital operator if you have any questions, and they will be happy to assist you.

## 2017-06-09 NOTE — Anesthesia Postprocedure Evaluation (Signed)
Anesthesia Post Note  Patient: Jordan Ward  Procedure(s) Performed: Procedure(s) (LRB): CYSTOSCOPY WITH LEFT RETROGRADE PYELOGRAM, URETEROSCOPY AND STENT PLACEMENT (Left) HOLMIUM LASER LITHOTRIPSY (Left)  Patient location during evaluation: PACU Anesthesia Type: General Level of consciousness: awake, awake and alert, oriented and patient cooperative Pain management: pain level controlled Vital Signs Assessment: post-procedure vital signs reviewed and stable Respiratory status: spontaneous breathing, nonlabored ventilation and respiratory function stable Cardiovascular status: stable Postop Assessment: no signs of nausea or vomiting Anesthetic complications: no     Last Vitals:  Vitals:   06/09/17 1102  BP: (!) 158/94  Pulse: 64  Resp: 18  Temp: 37 C  SpO2: 96%    Last Pain:  Vitals:   06/09/17 1102  TempSrc: Oral  PainSc: 6                  Tarah Buboltz L

## 2017-06-09 NOTE — H&P (Signed)
H&P  Chief Complaint: Left ureteral stone  History of Present Illness: 76 year old male with left distal  ureteral stone--presenting w/ fever/UTI recently, treated urgently w/ abx and left pcn tube. Now presenting for ureteroscopic stone mgmt--on abx currently.  Past Medical History:  Diagnosis Date  . Anemia   . Arthritis   . Back pain, chronic   . Bilateral carotid bruits   . C. difficile colitis 09/2011  . CAD (coronary artery disease)   . Carotid artery stenosis   . Cerebrovascular disease   . Colon polyps   . Dyslipidemia   . Dysphagia 10/07/2011  . Encephalopathy   . Gait disorder   . HA (headache)   . High grade dysplasia in colonic adenoma 09/2005  . HTN (hypertension), malignant 10/06/2011  . Hypernatremia   . Hypokalemia   . Hypothyroidism 10/08/2011  . Insomnia   . Junctional rhythm   . Kidney stones   . MS (multiple sclerosis) ([place])   . Neuromuscular disorder ([place])   . OSA (obstructive sleep apnea)   . Paroxysmal atrial tachycardia ([place])   . Peripheral vascular disease ([place])   . Pneumonia 4 yrs ago  . Pulmonary fibrosis ([place]) 10/06/2011  . Pulmonary nodule 10/08/2011  . PVD (peripheral vascular disease) ([place])   . Sacral ulcer ([place])   . Sleep apnea   . TIA (transient ischemic attack)   . Tremors of nervous system 10/08/2011  . Tremors of nervous system   . Urinary tract infection     Past Surgical History:  Procedure Laterality Date  . APPENDECTOMY  09/2005   at time of left hemicolectomy  . BACK SURGERY  1976/1979   lower  . CHOLECYSTECTOMY     Dr. Tamala Julian  . COLON SURGERY  09/2005   [place]man: four tubular adenomas, large adenomatous polyp with HIGH GRADE dysplasia  . COLONOSCOPY  11/2004   Dr. Sharol Roussel sessile polyp splenic flexure, 87mm sessile polyp desc colon, tubulovillous adenoma (bx not removed)  . COLONOSCOPY  01/2005   poor prep, polyp could not be found  . COLONOSCOPY  05/2005   with EMR, polypectomy Dr. Olegario Messier, bx showed high grade  dysplasia, partially resected  . COLONOSCOPY  09/2005   Dr. Arsenio Loader, [place] ink tattooing, four villous colon polyp (3 had been missed on previous colonoscopies due to limitations of procedures  . COLONOSCOPY  09/2006   normal TI, no polyps  . COLONOSCOPY  10/2007   Dr. Imogene Burn distal mammillations, benign bx, normal TI, random bx neg for microscopic colitis  . INGUINAL HERNIA REPAIR  1971   bilateral  . IR NEPHROSTOMY PLACEMENT LEFT  05/26/2017  . KIDNEY STONE SURGERY  09/13/2015  . NEPHROLITHOTOMY Left 09/13/2015   Procedure: LEFT PERCUTANEOUS NEPHROLITHOTOMY ;  Surgeon: Franchot Gallo, MD;  Location: [facility];  Service: Urology;  Laterality: Left;  . SUPRAPUBIC CATHETER INSERTION      Home Medications:    Allergies:  Allergies  Allergen Reactions  . Tetracyclines & Related Anaphylaxis and Rash  . Ciprofloxacin     Trouble swallowing unknown reaction according to wife     Family History  Problem Relation Age of Onset  . Cirrhosis Brother        etoh  . Stroke Mother 45  . Coronary artery disease Father 64  . Heart attack Brother   . Cancer Sister   . Multiple sclerosis Unknown   . Colon cancer Neg Hx     Social History:  reports that he has quit  smoking. His smoking use included Cigarettes. He has never used smokeless tobacco. He reports that he does not drink alcohol or use drugs.  ROS: A complete review of systems was performed.  All systems are negative except for pertinent findings as noted.  Physical Exam:  Vital signs in last 24 hours: Temp:  [98.6 F (37 C)] 98.6 F (37 C) (09/11 1102) Pulse Rate:  [64] 64 (09/11 1102) Resp:  [18] 18 (09/11 1102) BP: (158)/(94) 158/94 (09/11 1102) SpO2:  [96 %] 96 % (09/11 1102) Constitutional:  Alert and oriented, No acute distress Cardiovascular: Regular rate and rhythm, No JVD Respiratory: Normal respiratory effort, Lungs clear bilaterally GI: Abdomen is soft, nontender, nondistended, no abdominal masses. SP tube  in place Genitourinary: No CVAT. Normal male phallus, testes are descended bilaterally and non-tender and without masses, scrotum is normal in appearance without lesions or masses, perineum is normal on inspection. Lymphatic: No lymphadenopathy Neurologic: Grossly intact, no focal deficits Psychiatric: Normal mood and affect  Laboratory Data:  No results for input(s): WBC, HGB, HCT, PLT in the last 72 hours.  No results for input(s): NA, K, CL, GLUCOSE, BUN, CALCIUM, CREATININE in the last 72 hours.  Invalid input(s): CO3   No results found for this or any previous visit (from the past 24 hour(s)). No results found for this or any previous visit (from the past 240 hour(s)).  Renal Function: No results for input(s): CREATININE in the last 168 hours. CrCl cannot be calculated (Unknown ideal weight.).  Radiologic Imaging: No results found.  Impression/Assessment:  Left distal ureteral stone   Plan:  Cysto, left RGP, left URS with HLL/stone extraction, poss J2 stent

## 2017-06-09 NOTE — Anesthesia Procedure Notes (Signed)
Procedure Name: LMA Insertion Date/Time: 06/09/2017 12:16 PM Performed by: Raenette Rover Pre-anesthesia Checklist: Patient identified, Emergency Drugs available, Suction available and Patient being monitored Patient Re-evaluated:Patient Re-evaluated prior to induction Oxygen Delivery Method: Circle system utilized Preoxygenation: Pre-oxygenation with 100% oxygen Induction Type: IV induction LMA: LMA inserted LMA Size: 4.0 Number of attempts: 1 Placement Confirmation: positive ETCO2,  CO2 detector and breath sounds checked- equal and bilateral Tube secured with: Tape Dental Injury: Teeth and Oropharynx as per pre-operative assessment  Comments: I would recommend LMA 5 for future use.

## 2017-06-09 NOTE — Anesthesia Postprocedure Evaluation (Signed)
Anesthesia Post Note  Patient: Jordan Ward  Procedure(s) Performed: Procedure(s) (LRB): CYSTOSCOPY WITH LEFT RETROGRADE PYELOGRAM, LEFT URETEROSCOPY, LEFT URETEROSCOPIC STONE EXTRACTION, LEFT URETERAL STENT PLACEMENT (Left) EXCHANGE OF SUPRAPUBIC CATHETER LEFT NEPHROSTOMY TUBE REMOVAL  Patient location during evaluation: PACU Anesthesia Type: General Level of consciousness: awake and alert Pain management: satisfactory to patient Vital Signs Assessment: post-procedure vital signs reviewed and stable Respiratory status: spontaneous breathing and patient connected to nasal cannula oxygen Cardiovascular status: stable Postop Assessment: no signs of nausea or vomiting Anesthetic complications: no     Last Vitals:  Vitals:   06/09/17 1310 06/09/17 1329  BP:  133/81  Pulse:  (!) 55  Resp:  16  Temp:    SpO2: 100% 96%    Last Pain:  Vitals:   06/09/17 1310  TempSrc:   PainSc: 4                  Dylan Ruotolo

## 2017-06-09 NOTE — Anesthesia Preprocedure Evaluation (Signed)
Anesthesia Evaluation  Patient identified by MRN, date of birth, ID band Patient awake    Reviewed: Allergy & Precautions, H&P , NPO status , Patient's Chart, lab work & pertinent test results, reviewed documented beta blocker date and time   Airway Mallampati: II  TM Distance: >3 FB Neck ROM: full    Dental  (+) Dental Advisory Given, Caps, Missing   Pulmonary sleep apnea , former smoker,  Pulmonary fibrosis   Pulmonary exam normal breath sounds clear to auscultation       Cardiovascular Exercise Tolerance: Poor hypertension, Pt. on home beta blockers + CAD, + Peripheral Vascular Disease and +CHF  Normal cardiovascular exam Rhythm:regular Rate:Normal     Neuro/Psych  Headaches, MS. Carotid stenosis TIA Neuromuscular disease negative psych ROS   GI/Hepatic negative GI ROS, Neg liver ROS,   Endo/Other  negative endocrine ROSHypothyroidism   Renal/GU Renal diseaseCRT 1.72  negative genitourinary   Musculoskeletal   Abdominal   Peds  Hematology negative hematology ROS (+)   Anesthesia Other Findings   Reproductive/Obstetrics negative OB ROS                             Anesthesia Physical Anesthesia Plan  ASA: III  Anesthesia Plan: General   Post-op Pain Management:    Induction: Intravenous  PONV Risk Score and Plan:   Airway Management Planned: LMA  Additional Equipment:   Intra-op Plan:   Post-operative Plan: Extubation in OR  Informed Consent: I have reviewed the patients History and Physical, chart, labs and discussed the procedure including the risks, benefits and alternatives for the proposed anesthesia with the patient or authorized representative who has indicated his/her understanding and acceptance.     Plan Discussed with:   Anesthesia Plan Comments:         Anesthesia Quick Evaluation

## 2017-06-09 NOTE — Op Note (Signed)
Preoperative diagnosis: Left ureteral stone.  Postoperative diagnosis: Same.  Principal procedure: Cystoscopy, left retrograde ureteropyelogram with fluoroscopic interpretation, left ureteroscopy, left ureteroscopic stone extraction, placement of 6 French by 24 centimeter contour double-J stent without tether, exchange of suprapubic tube, removal of left nephrostomy tube.  Surgeon: [name]  Anesthesia: Gen. with LMA.  Copy locations: None.  Specimen: Stone, to the patient's wife.  Estimated blood loss: None.  Drains: 24 French Foley catheter as suprapubic tube.  Indications: 76 year old male with MS, long-term suprapubic tube for bladder drainage, and recent hospitalization for an obstructing, infected left distal ureteral stone.  He has a nephrostomy tube in place.  He has completed a course of antibiotics and presents at this time for ureteroscopic stone management.  I discussed the procedure with the patient and his wife.  They understand these as well as the risks involved.  They desire to proceed.  Description of procedure: The patient was properly identified and marked in the holding area.  He received preoperative IV antibiotics.  He was taken to the operating room where general anesthetic was administered using the LMA.  He is placed in the dorsolithotomy position.  Genitalia and perineum were prepped and draped.  A proper timeout was performed.  I then passed a 21 French panendoscope into his bladder.  Ladder neck was somewhat high riding but there was no prostatic obstruction.  His bladder was inspected.  Some old stone material was present posteriorly.  There were no urothelial lesions.  There was a stone noted peeking through the left ureteral orifice.  I guided the open-ended catheter around this and performed a retrograde study.  This revealed a normal-appearing left ureter with the exception of some mild tortuosity.  There was no filling defect except for that previously  mentioned stone seen at the UVJ.  There was free flow of the contrast into the left renal pelvis which was drained with a nephrostomy tube, so no significant pyelocalyceal system was seen.  At this point, a zip wire was placed through the open-ended catheter, and negotiated in the upper pole calyceal system.  I then guided the ureteroscope by the stone and dislodge the stone, which came out into the bladder.  The remaining ureter was normal with no stones or urothelial abnormalities/strictures seen.  The scope was advanced all the way to the renal pelvis and the nephrostomy tube could be seen with a curl.  I then backed the ureteroscope out.  I then backloaded the guidewire through the cystoscope, and then passed a 24 centimeter 6 French contour double-J stent with the string off.  The stent was deployed in the ureter.  Following removal of the guidewire with excellent proximal and distal curl seen.  I then rinsed the kidney stone out of the bladder with some other stone material.  There was no further stone material noted in the bladder.  This point.  The cystoscope was then removed.  I then exchanged the 24 centimeter Foley catheter used as a suprapubic tube.  The new suprapubic tube was hooked to dependent drainage.  The nephrostomy tube was then removed and the nephrostomy site dressed.  The patient tolerated the procedure well.  He was awakened and taken to the PACU in stable condition.

## 2017-06-09 NOTE — H&P (View-Only) (Signed)
  Subjective: Patient reports less flank pain  Objective: Vital signs in last 24 hours: Temp:  [97.8 F (36.6 C)-99.4 F (37.4 C)] 97.8 F (36.6 C) (08/31 0516) Pulse Rate:  [55-66] 55 (08/31 0516) Resp:  [18] 18 (08/31 0516) BP: (154-165)/(84-86) 160/86 (08/31 0516) SpO2:  [94 %-98 %] 98 % (08/31 0516)  Intake/Output from previous day: 08/30 0701 - 08/31 0700 In: 4151.7 [P.O.:420; I.V.:3631.7; IV Piggyback:100] Out: 7939 [Urine:4150] Intake/Output this shift: No intake/output data recorded.  Physical Exam:  Constitutional: Vital signs reviewed. WD WN in NAD   Eyes: PERRL, No scleral icterus.   Cardiovascular: RRR Pulmonary/Chest: Normal effort   Lab Results:  Recent Labs  05/27/17 0926 05/29/17 0451  HGB 12.5* 12.4*  HCT 37.5* 36.1*   BMET  Recent Labs  05/27/17 0926 05/29/17 0451  NA 139 139  K 4.0 3.8  CL 108 109  CO2 24 23  GLUCOSE 123* 107*  BUN 47* 33*  CREATININE 3.25* 1.81*  CALCIUM 8.5* 8.0*    Recent Labs  05/26/17 0922  INR 1.17   No results for input(s): LABURIN in the last 72 hours. Results for orders placed or performed during the hospital encounter of 05/25/17  Blood Culture (routine x 2)     Status: None (Preliminary result)   Collection Time: 05/25/17 11:05 PM  Result Value Ref Range Status   Specimen Description BLOOD RIGHT ARM  Final   Special Requests   Final    BOTTLES DRAWN AEROBIC AND ANAEROBIC Blood Culture adequate volume   Culture NO GROWTH 4 DAYS  Final   Report Status PENDING  Incomplete  Blood Culture (routine x 2)     Status: None (Preliminary result)   Collection Time: 05/25/17 11:16 PM  Result Value Ref Range Status   Specimen Description BLOOD LEFT HAND  Final   Special Requests   Final    BOTTLES DRAWN AEROBIC AND ANAEROBIC Blood Culture adequate volume   Culture NO GROWTH 4 DAYS  Final   Report Status PENDING  Incomplete  Urine Culture     Status: None   Collection Time: 05/27/17  9:31 AM  Result Value  Ref Range Status   Specimen Description URINE, RANDOM  Final   Special Requests NONE  Final   Culture   Final    NO GROWTH Performed at [facility], [place] [address], [place], [place] [address]    Report Status 05/28/2017 FINAL  Final    Studies/Results: No results found.  Assessment/Plan:   S/p pcn tube placement for obstructing,infected left ureteral stone.  Cont abx   LOS: 3 days   Franchot Gallo M 05/29/2017, 7:10 AM

## 2017-06-10 ENCOUNTER — Encounter (HOSPITAL_COMMUNITY): Payer: Self-pay | Admitting: Urology

## 2017-06-14 ENCOUNTER — Emergency Department (HOSPITAL_COMMUNITY): Payer: Medicare Other

## 2017-06-14 ENCOUNTER — Inpatient Hospital Stay (HOSPITAL_COMMUNITY)
Admission: EM | Admit: 2017-06-14 | Discharge: 2017-06-18 | DRG: 698 | Disposition: A | Discharge status: Home / Self Care | Payer: Medicare Other | Attending: Pulmonary Disease | Admitting: Pulmonary Disease

## 2017-06-14 ENCOUNTER — Encounter (HOSPITAL_COMMUNITY): Payer: Self-pay | Admitting: Emergency Medicine

## 2017-06-14 DIAGNOSIS — I130 Hypertensive heart and chronic kidney disease with heart failure and stage 1 through stage 4 chronic kidney disease, or unspecified chronic kidney disease: Secondary | ICD-10-CM | POA: Diagnosis present

## 2017-06-14 DIAGNOSIS — K5900 Constipation, unspecified: Secondary | ICD-10-CM | POA: Diagnosis present

## 2017-06-14 DIAGNOSIS — G8929 Other chronic pain: Secondary | ICD-10-CM | POA: Diagnosis present

## 2017-06-14 DIAGNOSIS — N183 Chronic kidney disease, stage 3 unspecified: Secondary | ICD-10-CM | POA: Diagnosis present

## 2017-06-14 DIAGNOSIS — R509 Fever, unspecified: Secondary | ICD-10-CM

## 2017-06-14 DIAGNOSIS — E039 Hypothyroidism, unspecified: Secondary | ICD-10-CM | POA: Diagnosis present

## 2017-06-14 DIAGNOSIS — G35D Multiple sclerosis, unspecified: Secondary | ICD-10-CM | POA: Diagnosis present

## 2017-06-14 DIAGNOSIS — Z823 Family history of stroke: Secondary | ICD-10-CM

## 2017-06-14 DIAGNOSIS — G4733 Obstructive sleep apnea (adult) (pediatric): Secondary | ICD-10-CM | POA: Diagnosis present

## 2017-06-14 DIAGNOSIS — Y846 Urinary catheterization as the cause of abnormal reaction of the patient, or of later complication, without mention of misadventure at the time of the procedure: Secondary | ICD-10-CM | POA: Diagnosis not present

## 2017-06-14 DIAGNOSIS — Z9359 Other cystostomy status: Secondary | ICD-10-CM

## 2017-06-14 DIAGNOSIS — R918 Other nonspecific abnormal finding of lung field: Secondary | ICD-10-CM | POA: Diagnosis not present

## 2017-06-14 DIAGNOSIS — J440 Chronic obstructive pulmonary disease with acute lower respiratory infection: Secondary | ICD-10-CM | POA: Diagnosis present

## 2017-06-14 DIAGNOSIS — G35 Multiple sclerosis: Secondary | ICD-10-CM | POA: Diagnosis present

## 2017-06-14 DIAGNOSIS — I4719 Other supraventricular tachycardia: Secondary | ICD-10-CM | POA: Diagnosis present

## 2017-06-14 DIAGNOSIS — I10 Essential (primary) hypertension: Secondary | ICD-10-CM | POA: Diagnosis present

## 2017-06-14 DIAGNOSIS — Z8249 Family history of ischemic heart disease and other diseases of the circulatory system: Secondary | ICD-10-CM | POA: Diagnosis not present

## 2017-06-14 DIAGNOSIS — N10 Acute tubulo-interstitial nephritis: Secondary | ICD-10-CM | POA: Diagnosis not present

## 2017-06-14 DIAGNOSIS — I2510 Atherosclerotic heart disease of native coronary artery without angina pectoris: Secondary | ICD-10-CM | POA: Diagnosis present

## 2017-06-14 DIAGNOSIS — Y95 Nosocomial condition: Secondary | ICD-10-CM | POA: Diagnosis present

## 2017-06-14 DIAGNOSIS — A419 Sepsis, unspecified organism: Secondary | ICD-10-CM | POA: Diagnosis not present

## 2017-06-14 DIAGNOSIS — I69391 Dysphagia following cerebral infarction: Secondary | ICD-10-CM | POA: Diagnosis not present

## 2017-06-14 DIAGNOSIS — N179 Acute kidney failure, unspecified: Secondary | ICD-10-CM | POA: Diagnosis present

## 2017-06-14 DIAGNOSIS — I5032 Chronic diastolic (congestive) heart failure: Secondary | ICD-10-CM | POA: Diagnosis present

## 2017-06-14 DIAGNOSIS — Z23 Encounter for immunization: Secondary | ICD-10-CM | POA: Diagnosis not present

## 2017-06-14 DIAGNOSIS — I361 Nonrheumatic tricuspid (valve) insufficiency: Secondary | ICD-10-CM | POA: Diagnosis not present

## 2017-06-14 DIAGNOSIS — I340 Nonrheumatic mitral (valve) insufficiency: Secondary | ICD-10-CM | POA: Diagnosis not present

## 2017-06-14 DIAGNOSIS — E785 Hyperlipidemia, unspecified: Secondary | ICD-10-CM | POA: Diagnosis present

## 2017-06-14 DIAGNOSIS — T83511A Infection and inflammatory reaction due to indwelling urethral catheter, initial encounter: Principal | ICD-10-CM | POA: Diagnosis present

## 2017-06-14 DIAGNOSIS — I739 Peripheral vascular disease, unspecified: Secondary | ICD-10-CM | POA: Diagnosis present

## 2017-06-14 DIAGNOSIS — M5431 Sciatica, right side: Secondary | ICD-10-CM | POA: Diagnosis present

## 2017-06-14 DIAGNOSIS — N12 Tubulo-interstitial nephritis, not specified as acute or chronic: Secondary | ICD-10-CM

## 2017-06-14 DIAGNOSIS — N390 Urinary tract infection, site not specified: Secondary | ICD-10-CM | POA: Diagnosis not present

## 2017-06-14 DIAGNOSIS — R112 Nausea with vomiting, unspecified: Secondary | ICD-10-CM

## 2017-06-14 DIAGNOSIS — E876 Hypokalemia: Secondary | ICD-10-CM | POA: Diagnosis present

## 2017-06-14 DIAGNOSIS — Z87891 Personal history of nicotine dependence: Secondary | ICD-10-CM

## 2017-06-14 DIAGNOSIS — J189 Pneumonia, unspecified organism: Secondary | ICD-10-CM

## 2017-06-14 DIAGNOSIS — I471 Supraventricular tachycardia: Secondary | ICD-10-CM | POA: Diagnosis present

## 2017-06-14 DIAGNOSIS — R1110 Vomiting, unspecified: Secondary | ICD-10-CM | POA: Diagnosis not present

## 2017-06-14 DIAGNOSIS — R1111 Vomiting without nausea: Secondary | ICD-10-CM | POA: Diagnosis not present

## 2017-06-14 HISTORY — DX: Personal history of urinary calculi: Z87.442

## 2017-06-14 HISTORY — DX: Cerebral infarction, unspecified: I63.9

## 2017-06-14 LAB — CBC WITH DIFFERENTIAL/PLATELET
Basophils Absolute: 0 10*3/uL (ref 0.0–0.1)
Basophils Relative: 0 %
Eosinophils Absolute: 0.3 10*3/uL (ref 0.0–0.7)
Eosinophils Relative: 2 %
HEMATOCRIT: 44.6 % (ref 39.0–52.0)
HEMOGLOBIN: 14.9 g/dL (ref 13.0–17.0)
LYMPHS ABS: 1.8 10*3/uL (ref 0.7–4.0)
LYMPHS PCT: 13 %
MCH: 31.1 pg (ref 26.0–34.0)
MCHC: 33.4 g/dL (ref 30.0–36.0)
MCV: 93.1 fL (ref 78.0–100.0)
MONO ABS: 0.4 10*3/uL (ref 0.1–1.0)
MONOS PCT: 3 %
NEUTROS ABS: 11.5 10*3/uL — AB (ref 1.7–7.7)
Neutrophils Relative %: 82 %
Platelets: 153 10*3/uL (ref 150–400)
RBC: 4.79 MIL/uL (ref 4.22–5.81)
RDW: 13.4 % (ref 11.5–15.5)
WBC: 14 10*3/uL — ABNORMAL HIGH (ref 4.0–10.5)

## 2017-06-14 LAB — COMPREHENSIVE METABOLIC PANEL
ALBUMIN: 4 g/dL (ref 3.5–5.0)
ALK PHOS: 86 U/L (ref 38–126)
ALT: 14 U/L — AB (ref 17–63)
ANION GAP: 14 (ref 5–15)
AST: 25 U/L (ref 15–41)
BILIRUBIN TOTAL: 0.8 mg/dL (ref 0.3–1.2)
BUN: 24 mg/dL — AB (ref 6–20)
CALCIUM: 9.4 mg/dL (ref 8.9–10.3)
CO2: 25 mmol/L (ref 22–32)
CREATININE: 2.66 mg/dL — AB (ref 0.61–1.24)
Chloride: 98 mmol/L — ABNORMAL LOW (ref 101–111)
GFR calc Af Amer: 26 mL/min — ABNORMAL LOW (ref >60)
GFR calc non Af Amer: 22 mL/min — ABNORMAL LOW (ref >60)
GLUCOSE: 149 mg/dL — AB (ref 65–99)
Potassium: 4.9 mmol/L (ref 3.5–5.1)
Sodium: 137 mmol/L (ref 135–145)
TOTAL PROTEIN: 8 g/dL (ref 6.5–8.1)

## 2017-06-14 LAB — URINALYSIS, ROUTINE W REFLEX MICROSCOPIC
BILIRUBIN URINE: NEGATIVE
Glucose, UA: NEGATIVE mg/dL
Ketones, ur: NEGATIVE mg/dL
Nitrite: NEGATIVE
PH: 6 (ref 5.0–8.0)
Protein, ur: 100 mg/dL — AB
SPECIFIC GRAVITY, URINE: 1.017 (ref 1.005–1.030)

## 2017-06-14 LAB — LACTIC ACID, PLASMA
Lactic Acid, Venous: 3.4 mmol/L (ref 0.5–1.9)
Lactic Acid, Venous: 1.4 mmol/L (ref 0.5–1.9)

## 2017-06-14 LAB — MRSA PCR SCREENING: MRSA by PCR: NEGATIVE

## 2017-06-14 LAB — PHOSPHORUS: Phosphorus: 1.7 mg/dL — ABNORMAL LOW (ref 2.5–4.6)

## 2017-06-14 LAB — TROPONIN I: Troponin I: <0.03 ng/mL (ref <0.03)

## 2017-06-14 LAB — LIPASE, BLOOD: Lipase: 23 U/L (ref 11–51)

## 2017-06-14 LAB — MAGNESIUM: MAGNESIUM: 2 mg/dL (ref 1.7–2.4)

## 2017-06-14 MED ORDER — SODIUM CHLORIDE 0.9 % IV SOLN
INTRAVENOUS | Status: AC
  Administered 2017-06-15: 05:00:00 via INTRAVENOUS

## 2017-06-14 MED ORDER — ONDANSETRON HCL 4 MG/2ML IJ SOLN: 4.0000 mg | Freq: Four times a day (QID) | INTRAMUSCULAR | Status: DC | PRN

## 2017-06-14 MED ORDER — HEPARIN SODIUM (PORCINE) 5000 UNIT/ML IJ SOLN
5000.0000 [IU] | Freq: Three times a day (TID) | INTRAMUSCULAR | Status: DC
  Administered 2017-06-14 – 2017-06-18 (×12): 5000 [IU] via SUBCUTANEOUS
  Filled 2017-06-14 (×12): qty 1

## 2017-06-14 MED ORDER — SODIUM CHLORIDE 0.9 % IV SOLN: INTRAVENOUS | Status: DC

## 2017-06-14 MED ORDER — VANCOMYCIN HCL IN DEXTROSE 1-5 GM/200ML-% IV SOLN
1000.0000 mg | Freq: Once | INTRAVENOUS | Status: AC
  Administered 2017-06-14: 1000 mg via INTRAVENOUS
  Filled 2017-06-14: qty 200

## 2017-06-14 MED ORDER — FENTANYL CITRATE (PF) 100 MCG/2ML IJ SOLN
25.0000 ug | INTRAMUSCULAR | Status: DC | PRN
  Administered 2017-06-16 – 2017-06-17 (×2): 25 ug via INTRAVENOUS
  Filled 2017-06-14 (×2): qty 2

## 2017-06-14 MED ORDER — SODIUM CHLORIDE 0.9 % IV BOLUS (SEPSIS): 1000.0000 mL | Freq: Once | INTRAVENOUS | Status: DC

## 2017-06-14 MED ORDER — SODIUM CHLORIDE 0.9 % IV BOLUS (SEPSIS)
1000.0000 mL | Freq: Once | INTRAVENOUS | Status: AC
  Administered 2017-06-14: 1000 mL via INTRAVENOUS

## 2017-06-14 MED ORDER — PIPERACILLIN-TAZOBACTAM 3.375 G IVPB 30 MIN
3.3750 g | Freq: Once | INTRAVENOUS | Status: AC
  Administered 2017-06-14: 3.375 g via INTRAVENOUS
  Filled 2017-06-14: qty 50

## 2017-06-14 MED ORDER — LEVOTHYROXINE SODIUM 88 MCG PO TABS
88.0000 ug | ORAL_TABLET | Freq: Every day | ORAL | Status: DC
Start: 2017-06-15 — End: 2017-06-18
  Administered 2017-06-15 – 2017-06-18 (×4): 88 ug via ORAL
  Filled 2017-06-14 (×4): qty 1

## 2017-06-14 MED ORDER — VANCOMYCIN HCL 500 MG IV SOLR
500.0000 mg | INTRAVENOUS | Status: DC
  Administered 2017-06-15 – 2017-06-16 (×2): 500 mg via INTRAVENOUS
  Filled 2017-06-14 (×2): qty 500

## 2017-06-14 MED ORDER — ACETAMINOPHEN 650 MG RE SUPP
650.0000 mg | Freq: Once | RECTAL | Status: AC
  Administered 2017-06-14: 650 mg via RECTAL
  Filled 2017-06-14: qty 1

## 2017-06-14 MED ORDER — POTASSIUM PHOSPHATES 15 MMOLE/5ML IV SOLN
40.0000 meq | Freq: Once | INTRAVENOUS | Status: DC
Start: 2017-06-14 — End: 2017-06-15
  Filled 2017-06-14: qty 9.09

## 2017-06-14 MED ORDER — SODIUM CHLORIDE 0.9 % IV BOLUS (SEPSIS): 500.0000 mL | Freq: Once | INTRAVENOUS | Status: DC

## 2017-06-14 MED ORDER — DARIFENACIN HYDROBROMIDE ER 7.5 MG PO TB24
15.0000 mg | ORAL_TABLET | Freq: Every day | ORAL | Status: DC
  Administered 2017-06-17: 15 mg via ORAL
  Filled 2017-06-14 (×4): qty 2

## 2017-06-14 MED ORDER — DIAZEPAM 5 MG PO TABS
5.0000 mg | ORAL_TABLET | Freq: Two times a day (BID) | ORAL | Status: DC
  Administered 2017-06-14 – 2017-06-18 (×8): 5 mg via ORAL
  Filled 2017-06-14 (×9): qty 1

## 2017-06-14 MED ORDER — PIPERACILLIN-TAZOBACTAM 3.375 G IVPB
3.3750 g | Freq: Three times a day (TID) | INTRAVENOUS | Status: DC
  Administered 2017-06-14 – 2017-06-18 (×11): 3.375 g via INTRAVENOUS
  Filled 2017-06-14 (×10): qty 50

## 2017-06-14 NOTE — Progress Notes (Signed)
Pharmacy Antibiotic Note  Jordan Ward is a 76 y.o. male admitted on 06/14/2017 with sepsis.  Pharmacy has been consulted for Vancomycin and zosyn  dosing.  Plan: Vancomycin 1000mg  loading dose in ED, then 500mg  IV every 24 hours.  Goal trough 15-20 mcg/mL. Zosyn 3.375g IV q8h (4 hour infusion).  F/U cxs and clinical progres Monitor V/S, labs and levels as indicated  Height: 5\' 5"  (165.1 cm) Weight: 132 lb 15 oz (60.3 kg) IBW/kg (Calculated) : 61.5  Temp (24hrs), Avg:99.4 F (37.4 C), Min:98.2 F (36.8 C), Max:100.7 F (38.2 C)   Recent Labs Lab 06/14/17 1347 06/14/17 1348  WBC 14.0*  --   CREATININE 2.66*  --   LATICACIDVEN  --  3.4*    Estimated Creatinine Clearance: 20.8 mL/min (A) (by C-G formula based on SCr of 2.66 mg/dL (H)).    Allergies  Allergen Reactions  . Tetracyclines & Related Anaphylaxis and Rash  . Ciprofloxacin     Trouble swallowing unknown reaction according to wife     Antimicrobials this admission: Vancomycin 9/16 >>  Zosyn 9/16 >>   Dose adjustments this admission: N/A  Microbiology results: 9/16 BCx: pending 9/16 UCx: pending  Thank you for allowing pharmacy to be a part of this patient's care.  Isac Sarna, BS [place], [place] Clinical Pharmacist Pager [phone] 06/14/2017 5:28 PM

## 2017-06-14 NOTE — ED Notes (Signed)
Patient transported to CT 

## 2017-06-14 NOTE — ED Provider Notes (Signed)
[facility] DEPT Provider Note   CSN: [id number] Arrival date & time: 06/14/17  1328     History   Chief Complaint Chief Complaint  Patient presents with  . Emesis    HPI Jordan Ward is a 76 y.o. male.  HPI  Pt was seen at 1345. Per pt and his wife, c/o gradual onset and persistence of multiple intermittent episodes of N/V since this morning. Has been associated with chills, generalized body aches, decreased PO intake and decreased urination. Pt endorses hx of left ureteral calculi removal with stent placement and suprapubic catheter change 5 days ago. Denies abd pain, no diarrhea, no black or blood in emesis, no CP/SOB.   Past Medical History:  Diagnosis Date  . Anemia   . Arthritis   . Back pain, chronic   . Bilateral carotid bruits   . C. difficile colitis 09/2011  . CAD (coronary artery disease)   . Carotid artery stenosis   . Cerebrovascular disease   . Colon polyps   . Dyslipidemia   . Dysphagia 10/07/2011  . Encephalopathy   . Gait disorder   . HA (headache)   . High grade dysplasia in colonic adenoma 09/2005  . HTN (hypertension), malignant 10/06/2011  . Hypernatremia   . Hypokalemia   . Hypothyroidism 10/08/2011  . Insomnia   . Junctional rhythm   . Kidney stones   . MS (multiple sclerosis) ([place])   . Neuromuscular disorder ([place])   . OSA (obstructive sleep apnea)   . Paroxysmal atrial tachycardia ([place])   . Peripheral vascular disease ([place])   . Pneumonia 4 yrs ago  . Pulmonary fibrosis ([place]) 10/06/2011  . Pulmonary nodule 10/08/2011  . PVD (peripheral vascular disease) ([place])   . Sacral ulcer ([place])   . Sleep apnea   . TIA (transient ischemic attack)   . Tremors of nervous system 10/08/2011  . Tremors of nervous system   . Urinary tract infection     Patient Active Problem List   Diagnosis Date Noted  . Pressure injury of skin 05/26/2017  . Hydronephrosis due to obstruction of ureter 05/26/2017  . Obstructive uropathy 05/25/2017  . CKD (chronic kidney  disease), stage III 08/23/2016  . Hydronephrosis of left kidney 08/23/2016  . Kidney stones 09/13/2015  . Essential hypertension   . Pressure ulcer 08/04/2015  . Acute renal failure superimposed on stage 3 chronic kidney disease ([place]) 08/04/2015  . Chronic systolic congestive heart failure ([place]) 08/04/2015  . Hydronephrosis with obstructing calculus 08/04/2015  . Occult blood positive stool 09/05/2014  . Rash and nonspecific skin eruption 08/25/2014  . Edema 08/19/2014  . Subacute confusional state 08/06/2014  . Dilated cardiomyopathy ([place]) 07/21/2014  . Sepsis ([place]) 07/17/2014  . Severe sepsis ([place]) 07/17/2014  . Headache 03/13/2014  . CVA (cerebral infarction) 03/13/2014  . Abnormality of gait 03/13/2014  . Tremors of nervous system 10/08/2011  . Hypothyroidism 10/08/2011  . Pulmonary nodule 10/08/2011  . Dysphagia 10/07/2011  . HTN (hypertension), malignant 10/06/2011  . Chronic suprapubic catheter ([place]) 10/06/2011  . Pulmonary fibrosis ([place]) 10/06/2011  . Lower urinary tract infectious disease 10/03/2011  . PNA (pneumonia) probable 10/02/2011  . Junctional rhythm 10/02/2011  . Multiple sclerosis ([place]) 10/02/2011  . Anemia 06/18/2011  . Chronic diarrhea 06/17/2011  . Hx of adenomatous colonic polyps 06/17/2011  . High grade dysplasia in colonic adenoma 09/29/2005    Past Surgical History:  Procedure Laterality Date  . APPENDECTOMY  09/2005   at time of left hemicolectomy  .  BACK SURGERY  1976/1979   lower  . CHOLECYSTECTOMY     Dr. Tamala Julian  . COLON SURGERY  09/2005   [facility]: four tubular adenomas, large adenomatous polyp with HIGH GRADE dysplasia  . COLONOSCOPY  11/2004   Dr. Sharol Roussel sessile polyp splenic flexure, 19mm sessile polyp desc colon, tubulovillous adenoma (bx not removed)  . COLONOSCOPY  01/2005   poor prep, polyp could not be found  . COLONOSCOPY  05/2005   with EMR, polypectomy Dr. Olegario Messier, bx showed high grade dysplasia, partially resected    . COLONOSCOPY  09/2005   Dr. Arsenio Loader, [place] ink tattooing, four villous colon polyp (3 had been missed on previous colonoscopies due to limitations of procedures  . COLONOSCOPY  09/2006   normal TI, no polyps  . COLONOSCOPY  10/2007   Dr. Imogene Burn distal mammillations, benign bx, normal TI, random bx neg for microscopic colitis  . CYSTOSCOPY WITH RETROGRADE PYELOGRAM, URETEROSCOPY AND STENT PLACEMENT Left 06/09/2017   Procedure: CYSTOSCOPY WITH LEFT RETROGRADE PYELOGRAM, LEFT URETEROSCOPY, LEFT URETEROSCOPIC STONE EXTRACTION, LEFT URETERAL STENT PLACEMENT;  Surgeon: Franchot Gallo, MD;  Location: [facility];  Service: Urology;  Laterality: Left;  . INGUINAL HERNIA REPAIR  1971   bilateral  . INSERTION OF SUPRAPUBIC CATHETER  06/09/2017   Procedure: EXCHANGE OF SUPRAPUBIC CATHETER;  Surgeon: Franchot Gallo, MD;  Location: [facility];  Service: Urology;;  . IR NEPHROSTOMY PLACEMENT LEFT  05/26/2017  . KIDNEY STONE SURGERY  09/13/2015  . NEPHROLITHOTOMY Left 09/13/2015   Procedure: LEFT PERCUTANEOUS NEPHROLITHOTOMY ;  Surgeon: Franchot Gallo, MD;  Location: [facility];  Service: Urology;  Laterality: Left;  . SUPRAPUBIC CATHETER INSERTION         Home Medications    Prior to Admission medications   Medication Sig Start Date End Date Taking? Authorizing Provider  carvedilol (COREG) 6.25 MG tablet Take 1 tablet (6.25 mg total) by mouth 2 (two) times daily with a meal. 07/26/14   Verlee Monte, MD  diazepam (VALIUM) 5 MG tablet TAKE 1 TABLET BY MOUTH TWICE A DAY 03/14/17   Sater, Nanine Means, MD  HYDROcodone-acetaminophen (NORCO) 7.5-325 MG per tablet Take 1 tablet by mouth 2 (two) times daily. Max APAP 3 GM IN 24 HOURS FROM ALL SOURCES 08/22/14   Blanchie Serve, MD  ibuprofen (ADVIL,MOTRIN) 200 MG tablet Take 200 mg by mouth every 6 (six) hours as needed for fever.    [provider]  levofloxacin (LEVAQUIN) 500 MG tablet Take 500 mg by mouth daily.  05/25/17   [provider]   levothyroxine (SYNTHROID, LEVOTHROID) 88 MCG tablet Take 88 mcg by mouth daily before breakfast.    [provider]  potassium chloride SA (K-DUR,KLOR-CON) 20 MEQ tablet Take 20 mEq by mouth daily.    Rexene Alberts, MD  solifenacin (VESICARE) 10 MG tablet Take 10 mg by mouth daily as needed (for spasms).     [provider]  sulfamethoxazole-trimethoprim (BACTRIM DS,SEPTRA DS) 800-160 MG tablet Take 1 tablet by mouth 2 (two) times daily. 06/09/17   Franchot Gallo, MD  verapamil (CALAN) 40 MG tablet Take 40 mg by mouth 2 (two) times daily.    [provider]    Family History Family History  Problem Relation Age of Onset  . Cirrhosis Brother        etoh  . Stroke Mother 74  . Coronary artery disease Father 1  . Heart attack Brother   . Cancer Sister   . Multiple sclerosis Unknown   .  Colon cancer Neg Hx     Social History Social History  Substance Use Topics  . Smoking status: Former Smoker    Types: Cigarettes  . Smokeless tobacco: Never Used     Comment: quit remote  . Alcohol use No     Allergies   Tetracyclines & related and Ciprofloxacin   Review of Systems Review of Systems ROS: Statement: All systems negative except as marked or noted in the HPI; Constitutional: +fever and chills, generalized body aches.. ; ; Eyes: Negative for eye pain, redness and discharge. ; ; ENMT: Negative for ear pain, hoarseness, nasal congestion, sinus pressure and sore throat. ; ; Cardiovascular: Negative for chest pain, palpitations, diaphoresis, dyspnea and peripheral edema. ; ; Respiratory: Negative for cough, wheezing and stridor. ; ; Gastrointestinal: +N/V. Negative for diarrhea, abdominal pain, blood in stool, hematemesis, jaundice and rectal bleeding. . ; ; Genitourinary: Negative for dysuria and hematuria. ; ; Genital:  No penile drainage or rash, no testicular pain or swelling, no scrotal rash or swelling. ;; Musculoskeletal: Negative for back pain and  neck pain. Negative for swelling and trauma.; ; Skin: Negative for pruritus, rash, abrasions, blisters, bruising and skin lesion.; ; Neuro: Negative for headache, lightheadedness and neck stiffness. Negative for weakness, altered level of consciousness, altered mental status, extremity weakness, paresthesias, involuntary movement, seizure and syncope.      Physical Exam Updated Vital Signs BP (!) 105/91 (BP Location: Left Arm)   Pulse 73   Temp (!) 100.7 F (38.2 C) (Rectal)   Resp (!) 23   Ht 5\' 5"  (1.651 m)   Wt 61.7 kg (136 lb)   SpO2 93%   BMI 22.63 kg/m   Patient Vitals for the past 24 hrs:  BP Temp Temp src Pulse Resp SpO2 Height Weight  06/14/17 1512 119/63 - - 76 18 93 % - -  06/14/17 1349 - (!) 100.7 F (38.2 C) Rectal - - - - -  06/14/17 1342 (!) 105/91 98.2 F (36.8 C) Oral 73 (!) 23 93 % - -  06/14/17 1341 (!) 105/91 - - 73 - 94 % - -  06/14/17 1340 - - - - - - 5\' 5"  (1.651 m) 61.7 kg (136 lb)      Physical Exam 1350: Physical examination:  Nursing notes reviewed; Vital signs and O2 SAT reviewed; +febrile.;; Constitutional: Well developed, Well nourished, In no acute distress; Head:  Normocephalic, atraumatic; Eyes: EOMI, PERRL, No scleral icterus; ENMT: Mouth and pharynx normal, Mucous membranes dry; Neck: Supple, Full range of motion, No lymphadenopathy; Cardiovascular: Regular rate and rhythm, No gallop; Respiratory: Breath sounds clear & equal bilaterally, No wheezes.  Speaking full sentences with ease, Normal respiratory effort/excursion; Chest: Nontender, Movement normal; Abdomen: Soft, Nontender, Nondistended, Normal bowel sounds; Genitourinary: No CVA tenderness; Extremities: Pulses normal, No tenderness, No edema, No calf edema or asymmetry.; Neuro: AA&Ox3, Major CN grossly intact.  Speech clear. No gross focal motor or sensory deficits in extremities.; Skin: Color normal, Warm, Dry.   ED Treatments / Results  Labs (all labs ordered are listed, but only  abnormal results are displayed)   EKG  EKG Interpretation  Date/Time:  Sunday June 14 2017 14:30:36 EDT Ventricular Rate:  78 PR Interval:    QRS Duration: 115 QT Interval:  358 QTC Calculation: 408 R Axis:   41 Text Interpretation:  Sinus rhythm Premature ventricular complexes Nonspecific intraventricular conduction delay Repol abnrm, severe global ischemia (LM/MVD) Nonspecific ST and T wave abnormality Diffuse Artifact When compared  with ECG of 06/09/2017 Nonspecific ST and T wave abnormality is now Present Confirmed by Francine Graven [phone]) on 06/14/2017 2:48:40 PM       Radiology   Procedures Procedures (including critical care time)  Medications Ordered in ED Medications  acetaminophen (TYLENOL) suppository 650 mg (not administered)     Initial Impression / Assessment and Plan / ED Course  I have reviewed the triage vital signs and the nursing notes.  Pertinent labs & imaging results that were available during my care of the patient were reviewed by me and considered in my medical decision making (see chart for details).  MDM Reviewed: nursing note, vitals and previous chart Reviewed previous: labs and ECG Interpretation: labs, ECG, x-ray and CT scan Total time providing critical care: 30-74 minutes. This excludes time spent performing separately reportable procedures and services. Consults: urology and admitting MD   CRITICAL CARE Performed by: Alfonzo Feller Total critical care time: 35 minutes Critical care time was exclusive of separately billable procedures and treating other patients. Critical care was necessary to treat or prevent imminent or life-threatening deterioration. Critical care was time spent personally by me on the following activities: development of treatment plan with patient and/or surrogate as well as nursing, discussions with consultants, evaluation of patient's response to treatment, examination of patient, obtaining history  from patient or surrogate, ordering and performing treatments and interventions, ordering and review of laboratory studies, ordering and review of radiographic studies, pulse oximetry and re-evaluation of patient's condition.   Results for orders placed or performed during the hospital encounter of 06/14/17  Culture, blood (routine x 2)  Result Value Ref Range   Specimen Description BLOOD RIGHT HAND    Special Requests      BOTTLES DRAWN AEROBIC AND ANAEROBIC Blood Culture results may not be optimal due to an inadequate volume of blood received in culture bottles   Culture PENDING    Report Status PENDING   Culture, blood (routine x 2)  Result Value Ref Range   Specimen Description BLOOD LEFT HAND    Special Requests      BOTTLES DRAWN AEROBIC AND ANAEROBIC Blood Culture adequate volume   Culture PENDING    Report Status PENDING   Urinalysis, Routine w reflex microscopic  Result Value Ref Range   Color, Urine YELLOW YELLOW   APPearance HAZY (A) CLEAR   Specific Gravity, Urine 1.017 1.005 - 1.030   pH 6.0 5.0 - 8.0   Glucose, UA NEGATIVE NEGATIVE mg/dL   Hgb urine dipstick MODERATE (A) NEGATIVE   Bilirubin Urine NEGATIVE NEGATIVE   Ketones, ur NEGATIVE NEGATIVE mg/dL   Protein, ur 100 (A) NEGATIVE mg/dL   Nitrite NEGATIVE NEGATIVE   Leukocytes, UA LARGE (A) NEGATIVE   RBC / HPF TOO NUMEROUS TO COUNT 0 - 5 RBC/hpf   WBC, UA TOO NUMEROUS TO COUNT 0 - 5 WBC/hpf   Bacteria, UA RARE (A) NONE SEEN   Squamous Epithelial / LPF 0-5 (A) NONE SEEN   Mucus PRESENT    Budding Yeast PRESENT   Comprehensive metabolic panel  Result Value Ref Range   Sodium 137 135 - 145 mmol/L   Potassium 4.9 3.5 - 5.1 mmol/L   Chloride 98 (L) 101 - 111 mmol/L   CO2 25 22 - 32 mmol/L   Glucose, Bld 149 (H) 65 - 99 mg/dL   BUN 24 (H) 6 - 20 mg/dL   Creatinine, Ser 2.66 (H) 0.61 - 1.24 mg/dL   Calcium 9.4 8.9 - 10.3 mg/dL  Total Protein 8.0 6.5 - 8.1 g/dL   Albumin 4.0 3.5 - 5.0 g/dL   AST 25 15 - 41  U/L   ALT 14 (L) 17 - 63 U/L   Alkaline Phosphatase 86 38 - 126 U/L   Total Bilirubin 0.8 0.3 - 1.2 mg/dL   GFR calc non Af Amer 22 (L) >60 mL/min   GFR calc Af Amer 26 (L) >60 mL/min   Anion gap 14 5 - 15  Lipase, blood  Result Value Ref Range   Lipase 23 11 - 51 U/L  Troponin I  Result Value Ref Range   Troponin I <0.03 <0.03 ng/mL  Lactic acid, plasma  Result Value Ref Range   Lactic Acid, Venous 3.4 (HH) 0.5 - 1.9 mmol/L  CBC with Differential  Result Value Ref Range   WBC 14.0 (H) 4.0 - 10.5 K/uL   RBC 4.79 4.22 - 5.81 MIL/uL   Hemoglobin 14.9 13.0 - 17.0 g/dL   HCT 44.6 39.0 - 52.0 %   MCV 93.1 78.0 - 100.0 fL   MCH 31.1 26.0 - 34.0 pg   MCHC 33.4 30.0 - 36.0 g/dL   RDW 13.4 11.5 - 15.5 %   Platelets 153 150 - 400 K/uL   Neutrophils Relative % 82 %   Neutro Abs 11.5 (H) 1.7 - 7.7 K/uL   Lymphocytes Relative 13 %   Lymphs Abs 1.8 0.7 - 4.0 K/uL   Monocytes Relative 3 %   Monocytes Absolute 0.4 0.1 - 1.0 K/uL   Eosinophils Relative 2 %   Eosinophils Absolute 0.3 0.0 - 0.7 K/uL   Basophils Relative 0 %   Basophils Absolute 0.0 0.0 - 0.1 K/uL   Dg Chest 2 View Result Date: 06/14/2017 CLINICAL DATA:  Nausea, vomiting and chills. EXAM: CHEST  2 VIEW COMPARISON:  Chest radiograph 05/25/2017 FINDINGS: Lateral right basilar airspace opacities are new compared to the prior study. No other area of consolidation. No pulmonary edema. Cardiomediastinal contours are normal. No pleural effusion or pneumothorax. IMPRESSION: Opacities in the right lower lobe may indicate aspiration or developing pneumonia. Electronically Signed   By: Ulyses Jarred M.D.   On: 06/14/2017 14:21    Ct Renal Stone Study Result Date: 06/14/2017 CLINICAL DATA:  76 year old male with a history of nausea vomiting and chills since 9 a.m. today. Previous left percutaneous nephrostomy tube performed 05/26/2017. Prior outpatient Ureteral stent 06/09/2017 EXAM: CT ABDOMEN AND PELVIS WITHOUT CONTRAST TECHNIQUE:  Multidetector CT imaging of the abdomen and pelvis was performed following the standard protocol without IV contrast. COMPARISON:  CT 05/25/2017, multiple prior CT FINDINGS: Lower chest: Mixed nodular and ground-glass opacities of the right lung base with interlobular septal thickening/interstitial thickening, more pronounced than on the CT dated 05/25/2017. No large pleural effusion. Heart size within normal limits. Coronary calcifications. Hepatobiliary: Unremarkable appearance of liver. Surgical changes of cholecystectomy. There is a dense rounded focus at the confluence of left and right hepatic biliary system, at the proximal common hepatic duct. Diameter of the common bile duct measures 14 mm. On the prior CT this radiopaque stone was in the common bile duct just above the ampulla, now traveling retrograde. Pancreas: Unremarkable appearance of pancreas. Spleen: Unremarkable spleen Adrenals/Urinary Tract: Unremarkable bilateral adrenal glands. Right kidney atrophy with no hydronephrosis. Stones in the collecting system unchanged. Hypodense lesions at the superior cortex are unchanged from prior CT study. Hypodense lesion at the medial cortex measures 13 mm. Lesion at the anterior cortex measures 7 mm. Since the prior  CT there has been interval placement of a left double-J ureteral stent with the proximal loop formed in the pelvis collecting system and the distal loop formed in the urinary bladder. Urinary bladder is decompressed with suprapubic catheter remaining in place with the balloon inflated. Small amount of gas in the urinary bladder adjacent to the catheter. There is gas within the collecting system of the left kidney with gas in the pelvis, infundibula, and the calices throughout. Significant decreased pelvicaliectasis/ hydronephrosis. Stranding surrounding the left kidney. Low-density cystic lesions on the superior cortex of the left kidney, unchanged. No percutaneous nephrostomy in place.  Stomach/Bowel: Hiatal hernia. Unremarkable appearance of stomach. Duodenum diverticulum. Broad ventral Conrey hernia at the level of the umbilicus containing multiple loops of small bowel. Overall the size the ventral hernia is unchanged, although there is increasing dilation of small bowel loops. No transition is identified. Mild inflammatory changes of the mesenteric at this site. Surgical changes of appendectomy. Moderate formed stool burden to the length of the colon. Surgical changes of the left colon. No inflammatory changes associated with colon. Vascular/Lymphatic: Extensive calcifications of the abdominal aorta, similar to prior. 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 Small caliber distal aorta and iliac arteries. No inflammatory changes. Dense calcifications at the origin of the celiac artery, superior mesenteric artery, bilateral renal arteries. Reproductive: Transverse diameter of prostate measures 3.9 cm. Other: None Musculoskeletal: No acute displaced fracture. Multilevel degenerative changes of the spine, vacuum disc phenomenon at L5-S1. Degenerative changes of the bilateral hips. Early AVN at the right femoral head. IMPRESSION: Since the prior CT there is been interval placement of a left double-J ureteral stent. Gas within the left-sided collecting system and the urinary bladder. It is uncertain if this is related to manipulation of the suprapubic catheter, or if this may represent infection with gas-forming organism. No parenchymal gas identified. Interval removal of left percutaneous nephrostomy. Compared to prior CT studies there is increased ground-glass and nodular opacity at the right lung base. This is favored to represent pneumonia, potentially aspiration pneumonia/pneumonitis. Borderline dilated small bowel in the mid abdomen, with multiple small bowel loops associated with a broad ventral Hey hernia. Uncertain if this represents an ileus or early bowel  obstruction, although no transition site identified. These results were called by telephone at the time of interpretation on 06/14/2017 at 2:35 pm to Dr. Francine Graven , who verbally acknowledged these results. Mobile choledocholithiasis with no intrahepatic biliary ductal dilatation. Surgical changes of cholecystectomy. Aortic Atherosclerosis (ICD10-I70.0). Early AVN right femoral head. Electronically Signed   By: Corrie Mckusick D.O.   On: 06/14/2017 14:36    1500:  CXR with new pneumonia; CT questions renal infection. Code Sepsis already called with IVF bolus ordered, and IV abx started after [facility] and [facility]. Dx and testing d/w pt and family.  Questions answered.  Verb understanding, agreeable to admit.  T/C to Uro Dr. Karsten Ro, case discussed, including:  HPI, pertinent PM/SHx, VS/PE, dx testing, ED course and treatment:  He has viewed the images, states pt may have pyelonephritis given hx of suprapubic catheter/bacterial colonization and now has new ureteral stent, agrees with IVF and IV abx, no acute surgical issue at this time, admit to [facility] and Uro can consult tomorrow.   1520:  T/C to [facility] Dr. Olevia Bowens, case discussed, including:  HPI, pertinent PM/SHx, VS/PE, dx testing, ED course and treatment:  Agreeable to admit.     Final Clinical Impressions(s) / ED Diagnoses   Final diagnoses:  None    New Prescriptions New Prescriptions   No medications on file     Francine Graven, DO 06/17/17 3299

## 2017-06-14 NOTE — ED Triage Notes (Signed)
N/V and chills since 0900 today.  Had stent placed in lt kidney tue.

## 2017-06-14 NOTE — Consult Note (Signed)
  This is a patient of Dr. [name] who has a atrophic right kidney and recently developed left ureteral obstruction secondary to a stone with hydronephrosis and fever.  He had a left percutaneous nephrostomy tube placed and antibiotic therapy. He then underwent definitive management of his left ureteral stone by Dr. [name] on 06/09/17.  At that time the stone was treated, a stent was inserted and his nephrostomy tube was removed.  He was doing well until he presented to the emergency room feeling poorly and found to have a temperature 100.7 with a follow-up blood pressure but an elevated lactic acid.  A CT scan was obtained which revealed pneumonia also gas within the collecting system of the left kidney.  His urine had too numerous to count white blood cells and red blood cells as to be expected but rare bacteria. He underwent fluid resuscitation and was placed on Zosyn and vancomycin.  I was contacted regarding the finding of collecting system gas.  Reviewing his CT scan I note the significant left hydronephrosis that was present previously has now resolved with the stent in good position.  The suprapubic was noted to be in good position as well.  I discussed my interpretation of these findings with Dr. Thurnell Garbe.  It appears he has several reasons to have gas in the left collecting system including having recently had a nephrostomy tube as well as having a stent in place with a suprapubic tube in the bladder that could allow entrance of gas into the bladder.  The urine had significant red and white cells as to be expected there were only rare bacteria and so I felt there was still a possibility that this could be secondary to a gas-forming organism having gained access to the collecting system through the stent.  Because he has a stent in place the system is decompressed and therefore he should be treated like a possible acute pyelonephritis but also appears to have a pneumonia as well.    There is  currently no indication for urologic intervention at this time.

## 2017-06-14 NOTE — ED Notes (Signed)
Weight per triage note  61.68kg  Height: 65in

## 2017-06-14 NOTE — H&P (Signed)
History and Physical    Jordan Ward MWN:[id number] DOB: [birth year] DOA: 06/14/2017  PCP: Sinda Du, MD   Patient coming from: Home.  I have personally briefly reviewed patient's old medical records in [place]  Chief Complaint: Nausea, vomiting and chills.  HPI: Jordan Ward is a 76 y.o. male with medical history significant of anemia, arthritis, chronic back pain, bilateral carotid bruits, history of C. difficile colitis, CAD, cerebrovascular disease, colon polyps, hyperlipidemia, poststroke dysphagia, gait disorder, history of headaches, history of high-grade dysplasia colonic adenoma, hypertension, hypothyroidism, multiple sclerosis, urolithiasis, pulmonary fibrosis, pulmonary nodules, sleep apnea, TIA, OSA, paroxysmal atrial tachycardia, pulmonary fibrosis, sleep, sacral ulcer, PVD, urolithiasis S/P suprapubic catheter, left ureteral stent placement 5 days ago who is brought to the emergency department due nausea, multiple episodes of emesis and rigors since this morning.  Per patient, this morning shortly after he woke up he started feeling fever and chills. He took his morning medications, but after this he developed nausea which was followed by 3 episodes of emesis over the next 2-3 hours. He subsequently started developing malaise and rigors late in the morning and around noontime. He denies headache, sore throat, wheezing, but complains of productive cough. No chest pain, palpitations, diaphoresis, pitting edema lower extremities the complaints of lightheadedness. He denies abdominal pain, but complains of left flank pain and constipation. No melena or hematochezia. He denies skin rashes. No polyuria, polydipsia or blurred vision.  ED Course: Initial vital signs temperature is 38.2C, pulse 73, respirations 23, O2 sat 94% on room air and blood pressure 105/91 mmHg. The patient received 2 L of normal saline bolus, and acetaminophen suppository, Zosyn and vancomycin.   Workup  showed a urinalysis with significant hematuria and pyuria, mild proteinuria of rare bacteria. Blood cultures 2 were taken. Initial lactic acid was 3.4 mmol/L.  Review of Systems: As per HPI otherwise 10 point review of systems negative.    Past Medical History:  Diagnosis Date  . Anemia   . Arthritis   . Back pain, chronic   . Bilateral carotid bruits   . C. difficile colitis 09/2011  . CAD (coronary artery disease)   . Carotid artery stenosis   . Cerebrovascular disease   . Colon polyps   . Dyslipidemia   . Dysphagia 10/07/2011  . Encephalopathy   . Gait disorder   . HA (headache)   . High grade dysplasia in colonic adenoma 09/2005  . HTN (hypertension), malignant 10/06/2011  . Hypernatremia   . Hypokalemia   . Hypothyroidism 10/08/2011  . Insomnia   . Junctional rhythm   . Kidney stones   . MS (multiple sclerosis) ([place])   . Neuromuscular disorder ([place])   . OSA (obstructive sleep apnea)   . Paroxysmal atrial tachycardia ([place])   . Peripheral vascular disease ([place])   . Pneumonia 4 yrs ago  . Pulmonary fibrosis ([place]) 10/06/2011  . Pulmonary nodule 10/08/2011  . PVD (peripheral vascular disease) ([place])   . Sacral ulcer ([place])   . Sleep apnea   . TIA (transient ischemic attack)   . Tremors of nervous system 10/08/2011  . Tremors of nervous system   . Urinary tract infection     Past Surgical History:  Procedure Laterality Date  . APPENDECTOMY  09/2005   at time of left hemicolectomy  . BACK SURGERY  1976/1979   lower  . CHOLECYSTECTOMY     Dr. Tamala Julian  . COLON SURGERY  09/2005   [place]man: four tubular adenomas, large  adenomatous polyp with HIGH GRADE dysplasia  . COLONOSCOPY  11/2004   Dr. Sharol Roussel sessile polyp splenic flexure, 73mm sessile polyp desc colon, tubulovillous adenoma (bx not removed)  . COLONOSCOPY  01/2005   poor prep, polyp could not be found  . COLONOSCOPY  05/2005   with EMR, polypectomy Dr. Olegario Messier, bx showed high grade dysplasia, partially resected    . COLONOSCOPY  09/2005   Dr. Arsenio Loader, [place] ink tattooing, four villous colon polyp (3 had been missed on previous colonoscopies due to limitations of procedures  . COLONOSCOPY  09/2006   normal TI, no polyps  . COLONOSCOPY  10/2007   Dr. Imogene Burn distal mammillations, benign bx, normal TI, random bx neg for microscopic colitis  . CYSTOSCOPY WITH RETROGRADE PYELOGRAM, URETEROSCOPY AND STENT PLACEMENT Left 06/09/2017   Procedure: CYSTOSCOPY WITH LEFT RETROGRADE PYELOGRAM, LEFT URETEROSCOPY, LEFT URETEROSCOPIC STONE EXTRACTION, LEFT URETERAL STENT PLACEMENT;  Surgeon: Franchot Gallo, MD;  Location: [facility];  Service: Urology;  Laterality: Left;  . INGUINAL HERNIA REPAIR  1971   bilateral  . INSERTION OF SUPRAPUBIC CATHETER  06/09/2017   Procedure: EXCHANGE OF SUPRAPUBIC CATHETER;  Surgeon: Franchot Gallo, MD;  Location: [facility];  Service: Urology;;  . IR NEPHROSTOMY PLACEMENT LEFT  05/26/2017  . KIDNEY STONE SURGERY  09/13/2015  . NEPHROLITHOTOMY Left 09/13/2015   Procedure: LEFT PERCUTANEOUS NEPHROLITHOTOMY ;  Surgeon: Franchot Gallo, MD;  Location: [facility];  Service: Urology;  Laterality: Left;  . SUPRAPUBIC CATHETER INSERTION       reports that he has quit smoking. His smoking use included Cigarettes. He has never used smokeless tobacco. He reports that he does not drink alcohol or use drugs.  Allergies  Allergen Reactions  . Tetracyclines & Related Anaphylaxis and Rash  . Ciprofloxacin     Trouble swallowing unknown reaction according to wife     Family History  Problem Relation Age of Onset  . Cirrhosis Brother        etoh  . Stroke Mother 47  . Coronary artery disease Father 79  . Heart attack Brother   . Cancer Sister   . Multiple sclerosis Unknown   . Colon cancer Neg Hx     Prior to Admission medications   Medication Sig Start Date End Date Taking? Authorizing Provider  carvedilol (COREG) 6.25 MG tablet Take 1 tablet (6.25 mg total) by mouth 2 (two) times  daily with a meal. 07/26/14  Yes Elmahi, Mutaz, MD  diazepam (VALIUM) 5 MG tablet TAKE 1 TABLET BY MOUTH TWICE A DAY 03/14/17  Yes Sater, Nanine Means, MD  HYDROcodone-acetaminophen (NORCO) 7.5-325 MG per tablet Take 1 tablet by mouth 2 (two) times daily. Max APAP 3 GM IN 24 HOURS FROM ALL SOURCES Patient taking differently: Take 1 tablet by mouth 2 (two) times daily. May take up to 4 times daily as needed for pain 08/22/14  Yes Blanchie Serve, MD  ibuprofen (ADVIL,MOTRIN) 200 MG tablet Take 200 mg by mouth every 6 (six) hours as needed for fever.   Yes [provider]  levothyroxine (SYNTHROID, LEVOTHROID) 88 MCG tablet Take 88 mcg by mouth daily before breakfast.   Yes [provider]  potassium chloride SA (K-DUR,KLOR-CON) 20 MEQ tablet Take 20 mEq by mouth daily.   Yes Rexene Alberts, MD  solifenacin (VESICARE) 10 MG tablet Take 10 mg by mouth daily as needed (for spasms).    Yes [provider]  sulfamethoxazole-trimethoprim (BACTRIM DS,SEPTRA DS) 800-160 MG tablet Take 1 tablet by mouth 2 (  two) times daily. 06/09/17  Yes Dahlstedt, Annie Main, MD  verapamil (CALAN) 40 MG tablet Take 40 mg by mouth 2 (two) times daily.   Yes [provider]  levofloxacin (LEVAQUIN) 500 MG tablet Take 500 mg by mouth daily.  05/25/17   [provider]    Physical Exam: Vitals:   06/14/17 1341 06/14/17 1342 06/14/17 1349 06/14/17 1512  BP: (!) 105/91 (!) 105/91  119/63  Pulse: 73 73  76  Resp:  (!) 23  18  Temp:  98.2 F (36.8 C) (!) 100.7 F (38.2 C)   TempSrc:  Oral Rectal   SpO2: 94% 93%  93%  Weight:      Height:        Constitutional: NAD, calm, comfortable Eyes: PERRL, lids and conjunctivae normal ENMT: Mucous membranes are Mildly dry Posterior pharynx clear of any exudate or lesions.  Neck: normal, supple, no masses, no thyromegaly Respiratory: Right lower base decreased breath sounds and crackles, no wheezing. Normal respiratory effort. No accessory  muscle use.  Cardiovascular: Regular rate and rhythm, no murmurs / rubs / gallops. No extremity edema. 2+ pedal pulses. No carotid bruits.  Abdomen: Positive suprapubic catheter. Soft, no tenderness, no masses palpated. No hepatosplenomegaly. Bowel sounds positive.  Musculoskeletal: no clubbing / cyanosis. Good ROM, no contractures. Normal muscle tone.  Skin: Positive erythema on left flank area where nephrostomy was placed. Positive erythema around the suprapubic catheter. Neurologic: CN 2-12 grossly intact. Sensation intact, DTR normal. Global weakness. Psychiatric: Normal judgment and insight. Alert and oriented x 4. Normal mood.     Labs on Admission: I have personally reviewed following labs and imaging studies  CBC:  Recent Labs Lab 06/14/17 1347  WBC 14.0*  NEUTROABS 11.5*  HGB 14.9  HCT 44.6  MCV 93.1  PLT 161   Basic Metabolic Panel:  Recent Labs Lab 06/14/17 1347  NA 137  K 4.9  CL 98*  CO2 25  GLUCOSE 149*  BUN 24*  CREATININE 2.66*  CALCIUM 9.4   GFR: Estimated Creatinine Clearance: 21.2 mL/min (A) (by C-G formula based on SCr of 2.66 mg/dL (H)). Liver Function Tests:  Recent Labs Lab 06/14/17 1347  AST 25  ALT 14*  ALKPHOS 86  BILITOT 0.8  PROT 8.0  ALBUMIN 4.0    Recent Labs Lab 06/14/17 1347  LIPASE 23   No results for input(s): AMMONIA in the last 168 hours. Coagulation Profile: No results for input(s): INR, PROTIME in the last 168 hours. Cardiac Enzymes:  Recent Labs Lab 06/14/17 1347  TROPONINI <0.03   BNP (last 3 results) No results for input(s): PROBNP in the last 8760 hours. HbA1C: No results for input(s): HGBA1C in the last 72 hours. CBG: No results for input(s): GLUCAP in the last 168 hours. Lipid Profile: No results for input(s): CHOL, HDL, LDLCALC, TRIG, CHOLHDL, LDLDIRECT in the last 72 hours. Thyroid Function Tests: No results for input(s): TSH, T4TOTAL, FREET4, T3FREE, THYROIDAB in the last 72 hours. Anemia  Panel: No results for input(s): VITAMINB12, FOLATE, FERRITIN, TIBC, IRON, RETICCTPCT in the last 72 hours. Urine analysis:    Component Value Date/Time   COLORURINE YELLOW 06/14/2017 1347   APPEARANCEUR HAZY (A) 06/14/2017 1347   LABSPEC 1.017 06/14/2017 1347   PHURINE 6.0 06/14/2017 1347   GLUCOSEU NEGATIVE 06/14/2017 1347   HGBUR MODERATE (A) 06/14/2017 1347   BILIRUBINUR NEGATIVE 06/14/2017 1347   KETONESUR NEGATIVE 06/14/2017 1347   PROTEINUR 100 (A) 06/14/2017 1347   UROBILINOGEN 0.2 07/17/2014 1215  NITRITE NEGATIVE 06/14/2017 1347   LEUKOCYTESUR LARGE (A) 06/14/2017 1347    Radiological Exams on Admission: Dg Chest 2 View  Result Date: 06/14/2017 CLINICAL DATA:  Nausea, vomiting and chills. EXAM: CHEST  2 VIEW COMPARISON:  Chest radiograph 05/25/2017 FINDINGS: Lateral right basilar airspace opacities are new compared to the prior study. No other area of consolidation. No pulmonary edema. Cardiomediastinal contours are normal. No pleural effusion or pneumothorax. IMPRESSION: Opacities in the right lower lobe may indicate aspiration or developing pneumonia. Electronically Signed   By: Ulyses Jarred M.D.   On: 06/14/2017 14:21   Ct Renal Stone Study  Result Date: 06/14/2017 CLINICAL DATA:  76 year old male with a history of nausea vomiting and chills since 9 a.m. today. Previous left percutaneous nephrostomy tube performed 05/26/2017. Prior outpatient Ureteral stent 06/09/2017 EXAM: CT ABDOMEN AND PELVIS WITHOUT CONTRAST TECHNIQUE: Multidetector CT imaging of the abdomen and pelvis was performed following the standard protocol without IV contrast. COMPARISON:  CT 05/25/2017, multiple prior CT FINDINGS: Lower chest: Mixed nodular and ground-glass opacities of the right lung base with interlobular septal thickening/interstitial thickening, more pronounced than on the CT dated 05/25/2017. No large pleural effusion. Heart size within normal limits. Coronary calcifications. Hepatobiliary:  Unremarkable appearance of liver. Surgical changes of cholecystectomy. There is a dense rounded focus at the confluence of left and right hepatic biliary system, at the proximal common hepatic duct. Diameter of the common bile duct measures 14 mm. On the prior CT this radiopaque stone was in the common bile duct just above the ampulla, now traveling retrograde. Pancreas: Unremarkable appearance of pancreas. Spleen: Unremarkable spleen Adrenals/Urinary Tract: Unremarkable bilateral adrenal glands. Right kidney atrophy with no hydronephrosis. Stones in the collecting system unchanged. Hypodense lesions at the superior cortex are unchanged from prior CT study. Hypodense lesion at the medial cortex measures 13 mm. Lesion at the anterior cortex measures 7 mm. Since the prior CT there has been interval placement of a left double-J ureteral stent with the proximal loop formed in the pelvis collecting system and the distal loop formed in the urinary bladder. Urinary bladder is decompressed with suprapubic catheter remaining in place with the balloon inflated. Small amount of gas in the urinary bladder adjacent to the catheter. There is gas within the collecting system of the left kidney with gas in the pelvis, infundibula, and the calices throughout. Significant decreased pelvicaliectasis/ hydronephrosis. Stranding surrounding the left kidney. Low-density cystic lesions on the superior cortex of the left kidney, unchanged. No percutaneous nephrostomy in place. Stomach/Bowel: Hiatal hernia. Unremarkable appearance of stomach. Duodenum diverticulum. Broad ventral Balash hernia at the level of the umbilicus containing multiple loops of small bowel. Overall the size the ventral hernia is unchanged, although there is increasing dilation of small bowel loops. No transition is identified. Mild inflammatory changes of the mesenteric at this site. Surgical changes of appendectomy. Moderate formed stool burden to the length of the  colon. Surgical changes of the left colon. No inflammatory changes associated with colon. Vascular/Lymphatic: Extensive calcifications of the abdominal aorta, similar to prior. 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 Small caliber distal aorta and iliac arteries. No inflammatory changes. Dense calcifications at the origin of the celiac artery, superior mesenteric artery, bilateral renal arteries. Reproductive: Transverse diameter of prostate measures 3.9 cm. Other: None Musculoskeletal: No acute displaced fracture. Multilevel degenerative changes of the spine, vacuum disc phenomenon at L5-S1. Degenerative changes of the bilateral hips. Early AVN at the right femoral head. IMPRESSION: Since the prior  CT there is been interval placement of a left double-J ureteral stent. Gas within the left-sided collecting system and the urinary bladder. It is uncertain if this is related to manipulation of the suprapubic catheter, or if this may represent infection with gas-forming organism. No parenchymal gas identified. Interval removal of left percutaneous nephrostomy. Compared to prior CT studies there is increased ground-glass and nodular opacity at the right lung base. This is favored to represent pneumonia, potentially aspiration pneumonia/pneumonitis. Borderline dilated small bowel in the mid abdomen, with multiple small bowel loops associated with a broad ventral Elsass hernia. Uncertain if this represents an ileus or early bowel obstruction, although no transition site identified. These results were called by telephone at the time of interpretation on 06/14/2017 at 2:35 pm to Dr. Francine Graven , who verbally acknowledged these results. Mobile choledocholithiasis with no intrahepatic biliary ductal dilatation. Surgical changes of cholecystectomy. Aortic Atherosclerosis (ICD10-I70.0). Early AVN right femoral head. Electronically Signed   By: Corrie Mckusick D.O.   On: 06/14/2017 14:36   07/23/2014  Echo  ------------------------------------------------------------------- LV EF: 30% -  35%  ------------------------------------------------------------------- Indications:   MI - acute 410.91.  ------------------------------------------------------------------- History:  Risk factors: Former tobacco use. Hypertension.  ------------------------------------------------------------------- Study Conclusions  - Left ventricle: The cavity size was normal. Morgan thickness was increased in a pattern of mild LVH. Systolic function was moderately to severely reduced. The estimated ejection fraction was in the range of 30% to 35%. There is mild hypokinesis of the inferior myocardium. Left ventricular diastolic function parameters were normal. - Aortic valve: Valve area (VTI): 0.69 cm^2. Valve area (Vmax): 0.59 cm^2. Valve area (Vmean): 0.7 cm^2. - Mitral valve: There was mild regurgitation. - Atrial septum: No defect or patent foramen ovale was identified.  EKG: Independently reviewed. Vent. rate 78 BPM PR interval * ms QRS duration 115 ms QT/QTc 358/408 ms P-R-T axes 49 41 235 Sinus rhythm Premature ventricular complexes Nonspecific intraventricular conduction delay Repol abnrm, severe global ischemia (LM/MVD) Nonspecific ST and T wave abnormality Diffuse Artifact.  Assessment/Plan Principal Problem:   Sepsis secondary to UTI ([place]) Admit to stepdown/inpatient. Continue time-limited IV hydration. Continue vancomycin per pharmacy. Continue Zosyn per pharmacy. Follow-up blood cultures and sensitivity. Follow-up urine culture and sensitivity. Consult urology in the morning.  Active Problems:   PNA (pneumonia) Questionable aspiration room earlier today? Continue vancomycin and Zosyn.    Multiple sclerosis (HCC) Continue Valium by mouth twice a day. Supportive care.    Hypothyroidism Continue levothyroxine 88 g by mouth daily. Check TSH as needed.     Acute renal failure superimposed on stage 3 chronic kidney disease ([place]) Continue time-limited IV hydration at 75 mL an hour for 20 hours. Monitor intake and output. Follow-up electrolytes and renal function.    Chronic systolic congestive heart failure (HCC) Hold beta blocker.    Essential hypertension All antihypertensives and monitor blood pressure.    Paroxysmal atrial tachycardia (HCC) Not on anticoagulation. Atrial fibrillation or flutter versus SVT? Also history of junctional rhythm. Continue telemetry monitoring. Beta blocker and calcium channel blocker had been held due to hypotension. Please resume once the blood pressure numbers improved.    Hypophosphatemia Replacing.    DVT prophylaxis: Heparin SQ. Code Status: Full code. Family Communication: His wife was present in the ED. Disposition Plan: Admit for Iv antibiotics for several days. Urology evaluation in AM. Consults called: Routine urology consult in AM. Admission status: Inpatient/SDU.   Reubin Milan MD [facility] Pager [phone].  If 7PM-7AM, please contact  night-coverage [email or web address] Password [email or web address]  06/14/2017, 3:43 PM

## 2017-06-14 NOTE — ED Notes (Addendum)
CRITICAL VALUE ALERT  Critical Value:  Lactic Acid 3.4  Date & Time Notied:  06/14/17 1436  Provider Notified: Thurnell Garbe MD  Orders Received/Actions taken: None

## 2017-06-15 ENCOUNTER — Inpatient Hospital Stay (HOSPITAL_COMMUNITY): Payer: Medicare Other

## 2017-06-15 DIAGNOSIS — I340 Nonrheumatic mitral (valve) insufficiency: Secondary | ICD-10-CM

## 2017-06-15 DIAGNOSIS — I361 Nonrheumatic tricuspid (valve) insufficiency: Secondary | ICD-10-CM

## 2017-06-15 LAB — ECHOCARDIOGRAM COMPLETE
AO mean calculated velocity dopler: 96.6 cm/s
AV Area VTI index: 1.26 cm2/m2
AV Area VTI: 1.98 cm2
AV Area mean vel: 2.22 cm2
AV Mean grad: 4 mmHg
AV Peak grad: 10 mmHg
AV VEL mean LVOT/AV: 0.64
AV area mean vel ind: 1.32 cm2/m2
AV peak Index: 1.18
AV pk vel: 155 cm/s
AV vel: 2.11
Ao pk vel: 0.57 m/s
Ao-asc: 34 cm
E decel time: 229 ms
E/e' ratio: 6.61
FS: 11 % — AB (ref 28–44)
Height: 65 in
IVS/LV PW RATIO, ED: 1.15
LA ID, A-P, ES: 29 mm
LA diam end sys: 29 mm
LA diam index: 1.73 cm/m2
LA vol A4C: 64.3 mL
LA vol index: 34.9 mL/m2
LA vol: 58.6 mL
LV E/e' medial: 6.61
LV E/e'average: 6.61
LV PW d: 11 mm — AB (ref 0.6–1.1)
LV dias vol index: 56 mL/m2
LV dias vol: 95 mL (ref 62–150)
LV e' LATERAL: 9.79 cm/s
LV sys vol index: 33 mL/m2
LV sys vol: 56 mL (ref 21–61)
LVOT SV: 64 mL
LVOT VTI: 18.6 cm
LVOT area: 3.46 cm2
LVOT diameter: 21 mm
LVOT peak VTI: 0.61 cm
LVOT peak grad rest: 3 mmHg
LVOT peak vel: 88.8 cm/s
Lateral S' vel: 14.3 cm/s
MV Dec: 229
MV pk A vel: 85.7 m/s
MV pk E vel: 64.7 m/s
RV sys press: 39 mmHg
Reg peak vel: 301 cm/s
Simpson's disk: 41
Stroke v: 39 mL
TAPSE: 14.7 mm
TDI e' lateral: 9.79
TDI e' medial: 4.35
TR max vel: 301 cm/s
VTI: 30.5 cm
Valve area index: 1.26
Valve area: 2.11 cm2
Weight: 2179.91 [oz_av]

## 2017-06-15 LAB — CBC WITH DIFFERENTIAL/PLATELET
Basophils Absolute: 0 10*3/uL (ref 0.0–0.1)
Basophils Relative: 0 %
Eosinophils Absolute: 0.1 10*3/uL (ref 0.0–0.7)
Eosinophils Relative: 1 %
HCT: 38.2 % — ABNORMAL LOW (ref 39.0–52.0)
Hemoglobin: 12.6 g/dL — ABNORMAL LOW (ref 13.0–17.0)
Lymphocytes Relative: 10 %
Lymphs Abs: 1.5 10*3/uL (ref 0.7–4.0)
MCH: 31 pg (ref 26.0–34.0)
MCHC: 33 g/dL (ref 30.0–36.0)
MCV: 93.9 fL (ref 78.0–100.0)
Monocytes Absolute: 0.8 10*3/uL (ref 0.1–1.0)
Monocytes Relative: 6 %
Neutro Abs: 12.4 10*3/uL — ABNORMAL HIGH (ref 1.7–7.7)
Neutrophils Relative %: 83 %
Platelets: 131 10*3/uL — ABNORMAL LOW (ref 150–400)
RBC: 4.07 MIL/uL — ABNORMAL LOW (ref 4.22–5.81)
RDW: 13.8 % (ref 11.5–15.5)
WBC: 14.8 10*3/uL — ABNORMAL HIGH (ref 4.0–10.5)

## 2017-06-15 LAB — BASIC METABOLIC PANEL
ANION GAP: 9 (ref 5–15)
BUN: 25 mg/dL — ABNORMAL HIGH (ref 6–20)
CO2: 24 mmol/L (ref 22–32)
Calcium: 8.4 mg/dL — ABNORMAL LOW (ref 8.9–10.3)
Chloride: 104 mmol/L (ref 101–111)
Creatinine, Ser: 2.22 mg/dL — ABNORMAL HIGH (ref 0.61–1.24)
GFR calc non Af Amer: 27 mL/min — ABNORMAL LOW (ref >60)
GFR, EST AFRICAN AMERICAN: 32 mL/min — AB (ref >60)
Glucose, Bld: 114 mg/dL — ABNORMAL HIGH (ref 65–99)
POTASSIUM: 4.2 mmol/L (ref 3.5–5.1)
SODIUM: 137 mmol/L (ref 135–145)

## 2017-06-15 LAB — C DIFFICILE QUICK SCREEN W PCR REFLEX
C Diff antigen: NEGATIVE
C Diff interpretation: NOT DETECTED
C Diff toxin: NEGATIVE

## 2017-06-15 LAB — STREP PNEUMONIAE URINARY ANTIGEN: Strep Pneumo Urinary Antigen: NEGATIVE

## 2017-06-15 MED ORDER — PNEUMOCOCCAL VAC POLYVALENT 25 MCG/0.5ML IJ INJ
0.5000 mL | INJECTION | INTRAMUSCULAR | Status: AC
  Administered 2017-06-16: 0.5 mL via INTRAMUSCULAR
  Filled 2017-06-15: qty 0.5

## 2017-06-15 MED ORDER — ACETAMINOPHEN 325 MG PO TABS
650.0000 mg | ORAL_TABLET | Freq: Four times a day (QID) | ORAL | Status: DC | PRN
  Administered 2017-06-15: 650 mg via ORAL
  Filled 2017-06-15: qty 2

## 2017-06-15 MED ORDER — INFLUENZA VAC SPLIT HIGH-DOSE 0.5 ML IM SUSY
0.5000 mL | PREFILLED_SYRINGE | INTRAMUSCULAR | Status: DC
  Filled 2017-06-15: qty 0.5

## 2017-06-15 MED ORDER — POTASSIUM PHOSPHATES 15 MMOLE/5ML IV SOLN
40.0000 meq | Freq: Once | INTRAVENOUS | Status: AC
  Administered 2017-06-15: 40 meq via INTRAVENOUS
  Filled 2017-06-15: qty 9.09

## 2017-06-15 NOTE — Plan of Care (Signed)
Problem: Safety: Goal: Ability to remain free from injury will improve Outcome: Progressing Bed in lowest position. BSC beside bed; call light within reach; belongings near bed. Adequate lighting  Problem: Fluid Volume: Goal: Ability to maintain a balanced intake and output will improve Outcome: Progressing Infusing fluids were discontinued to prevent fluid overload. Patient has efficient oral intake.

## 2017-06-15 NOTE — Progress Notes (Signed)
Subjective: He was admitted yesterday with sepsis. He's had multiple urological procedures. He has a suprapubic catheter. He had recently had nephrostomy tube which was removed last week. He had stenting last week. He developed fever and chills yesterday had vomiting 3 and came to the hospital. He was found to have what looks like a right lower lobe lateral pneumonia. He has a lot of sediment in his urine as well. He had gas in his urinary tract likely from the instrumentation that he's been having. He's not coughing much he says. He had some pain in his back but not in his chest.  Objective: Vital signs in last 24 hours: Temp:  [97.9 F (36.6 C)-100.7 F (38.2 C)] 99.8 F (37.7 C) (09/17 0400) Pulse Rate:  [64-85] 74 (09/17 0645) Resp:  [15-31] 27 (09/17 0645) BP: (105-181)/(54-121) 137/63 (09/17 0645) SpO2:  [87 %-99 %] 89 % (09/17 0645) Weight:  [60.3 kg (132 lb 15 oz)-61.8 kg (136 lb 3.9 oz)] 61.8 kg (136 lb 3.9 oz) (09/17 0500) Weight change:  Last BM Date: 06/14/17  Intake/Output from previous day: 09/16 0701 - 09/17 0700 In: 3200 [IV Piggyback:3200] Out: 450 [Urine:450]  PHYSICAL EXAM General appearance: alert, cooperative and mild distress Resp: rhonchi bilaterally Cardio: regular rate and rhythm, S1, S2 normal, no murmur, click, rub or gallop GI: soft, non-tender; bowel sounds normal; no masses,  no organomegaly Extremities: extremities normal, atraumatic, no cyanosis or edema Skin warm and dry  Lab Results:  Results for orders placed or performed during the hospital encounter of 06/14/17 (from the past 48 hour(s))  Urinalysis, Routine w reflex microscopic     Status: Abnormal   Collection Time: 06/14/17  1:47 PM  Result Value Ref Range   Color, Urine YELLOW YELLOW   APPearance HAZY (A) CLEAR   Specific Gravity, Urine 1.017 1.005 - 1.030   pH 6.0 5.0 - 8.0   Glucose, UA NEGATIVE NEGATIVE mg/dL   Hgb urine dipstick MODERATE (A) NEGATIVE   Bilirubin Urine NEGATIVE  NEGATIVE   Ketones, ur NEGATIVE NEGATIVE mg/dL   Protein, ur 100 (A) NEGATIVE mg/dL   Nitrite NEGATIVE NEGATIVE   Leukocytes, UA LARGE (A) NEGATIVE   RBC / HPF TOO NUMEROUS TO COUNT 0 - 5 RBC/hpf   WBC, UA TOO NUMEROUS TO COUNT 0 - 5 WBC/hpf   Bacteria, UA RARE (A) NONE SEEN   Squamous Epithelial / LPF 0-5 (A) NONE SEEN   Mucus PRESENT    Budding Yeast PRESENT   Comprehensive metabolic panel     Status: Abnormal   Collection Time: 06/14/17  1:47 PM  Result Value Ref Range   Sodium 137 135 - 145 mmol/L   Potassium 4.9 3.5 - 5.1 mmol/L   Chloride 98 (L) 101 - 111 mmol/L   CO2 25 22 - 32 mmol/L   Glucose, Bld 149 (H) 65 - 99 mg/dL   BUN 24 (H) 6 - 20 mg/dL   Creatinine, Ser 2.66 (H) 0.61 - 1.24 mg/dL   Calcium 9.4 8.9 - 10.3 mg/dL   Total Protein 8.0 6.5 - 8.1 g/dL   Albumin 4.0 3.5 - 5.0 g/dL   AST 25 15 - 41 U/L   ALT 14 (L) 17 - 63 U/L   Alkaline Phosphatase 86 38 - 126 U/L   Total Bilirubin 0.8 0.3 - 1.2 mg/dL   GFR calc non Af Amer 22 (L) >60 mL/min   GFR calc Af Amer 26 (L) >60 mL/min    Comment: (NOTE) The eGFR  has been calculated using the CKD EPI equation. This calculation has not been validated in all clinical situations. eGFR's persistently <60 mL/min signify possible Chronic Kidney Disease.    Anion gap 14 5 - 15  Lipase, blood     Status: None   Collection Time: 06/14/17  1:47 PM  Result Value Ref Range   Lipase 23 11 - 51 U/L  Troponin I     Status: None   Collection Time: 06/14/17  1:47 PM  Result Value Ref Range   Troponin I <0.03 <0.03 ng/mL  CBC with Differential     Status: Abnormal   Collection Time: 06/14/17  1:47 PM  Result Value Ref Range   WBC 14.0 (H) 4.0 - 10.5 K/uL   RBC 4.79 4.22 - 5.81 MIL/uL   Hemoglobin 14.9 13.0 - 17.0 g/dL   HCT 44.6 39.0 - 52.0 %   MCV 93.1 78.0 - 100.0 fL   MCH 31.1 26.0 - 34.0 pg   MCHC 33.4 30.0 - 36.0 g/dL   RDW 13.4 11.5 - 15.5 %   Platelets 153 150 - 400 K/uL   Neutrophils Relative % 82 %   Neutro Abs 11.5  (H) 1.7 - 7.7 K/uL   Lymphocytes Relative 13 %   Lymphs Abs 1.8 0.7 - 4.0 K/uL   Monocytes Relative 3 %   Monocytes Absolute 0.4 0.1 - 1.0 K/uL   Eosinophils Relative 2 %   Eosinophils Absolute 0.3 0.0 - 0.7 K/uL   Basophils Relative 0 %   Basophils Absolute 0.0 0.0 - 0.1 K/uL  Magnesium     Status: None   Collection Time: 06/14/17  1:47 PM  Result Value Ref Range   Magnesium 2.0 1.7 - 2.4 mg/dL  Phosphorus     Status: Abnormal   Collection Time: 06/14/17  1:47 PM  Result Value Ref Range   Phosphorus 1.7 (L) 2.5 - 4.6 mg/dL  Lactic acid, plasma     Status: Abnormal   Collection Time: 06/14/17  1:48 PM  Result Value Ref Range   Lactic Acid, Venous 3.4 (HH) 0.5 - 1.9 mmol/L    Comment: CRITICAL RESULT CALLED TO, READ BACK BY AND VERIFIED WITH: EVERETT,R AT 1435 ON 06/14/2017 BY MOSLEY,J   Culture, blood (routine x 2)     Status: None (Preliminary result)   Collection Time: 06/14/17  2:31 PM  Result Value Ref Range   Specimen Description BLOOD RIGHT HAND    Special Requests      BOTTLES DRAWN AEROBIC AND ANAEROBIC Blood Culture results may not be optimal due to an inadequate volume of blood received in culture bottles   Culture PENDING    Report Status PENDING   Culture, blood (routine x 2)     Status: None (Preliminary result)   Collection Time: 06/14/17  2:34 PM  Result Value Ref Range   Specimen Description BLOOD LEFT HAND    Special Requests      BOTTLES DRAWN AEROBIC AND ANAEROBIC Blood Culture adequate volume   Culture PENDING    Report Status PENDING   Lactic acid, plasma     Status: None   Collection Time: 06/14/17  5:05 PM  Result Value Ref Range   Lactic Acid, Venous 1.4 0.5 - 1.9 mmol/L  MRSA PCR Screening     Status: None   Collection Time: 06/14/17  7:00 PM  Result Value Ref Range   MRSA by PCR NEGATIVE NEGATIVE    Comment:  The GeneXpert MRSA Assay (FDA approved for NASAL specimens only), is one component of a comprehensive MRSA  colonization surveillance program. It is not intended to diagnose MRSA infection nor to guide or monitor treatment for MRSA infections.   Basic metabolic panel     Status: Abnormal   Collection Time: 06/15/17  5:30 AM  Result Value Ref Range   Sodium 137 135 - 145 mmol/L   Potassium 4.2 3.5 - 5.1 mmol/L   Chloride 104 101 - 111 mmol/L   CO2 24 22 - 32 mmol/L   Glucose, Bld 114 (H) 65 - 99 mg/dL   BUN 25 (H) 6 - 20 mg/dL   Creatinine, Ser 2.22 (H) 0.61 - 1.24 mg/dL   Calcium 8.4 (L) 8.9 - 10.3 mg/dL   GFR calc non Af Amer 27 (L) >60 mL/min   GFR calc Af Amer 32 (L) >60 mL/min    Comment: (NOTE) The eGFR has been calculated using the CKD EPI equation. This calculation has not been validated in all clinical situations. eGFR's persistently <60 mL/min signify possible Chronic Kidney Disease.    Anion gap 9 5 - 15    ABGS No results for input(s): PHART, PO2ART, TCO2, HCO3 in the last 72 hours.  Invalid input(s): PCO2 CULTURES Recent Results (from the past 240 hour(s))  Culture, blood (routine x 2)     Status: None (Preliminary result)   Collection Time: 06/14/17  2:31 PM  Result Value Ref Range Status   Specimen Description BLOOD RIGHT HAND  Final   Special Requests   Final    BOTTLES DRAWN AEROBIC AND ANAEROBIC Blood Culture results may not be optimal due to an inadequate volume of blood received in culture bottles   Culture PENDING  Incomplete   Report Status PENDING  Incomplete  Culture, blood (routine x 2)     Status: None (Preliminary result)   Collection Time: 06/14/17  2:34 PM  Result Value Ref Range Status   Specimen Description BLOOD LEFT HAND  Final   Special Requests   Final    BOTTLES DRAWN AEROBIC AND ANAEROBIC Blood Culture adequate volume   Culture PENDING  Incomplete   Report Status PENDING  Incomplete  MRSA PCR Screening     Status: None   Collection Time: 06/14/17  7:00 PM  Result Value Ref Range Status   MRSA by PCR NEGATIVE NEGATIVE Final     Comment:        The GeneXpert MRSA Assay (FDA approved for NASAL specimens only), is one component of a comprehensive MRSA colonization surveillance program. It is not intended to diagnose MRSA infection nor to guide or monitor treatment for MRSA infections.    Studies/Results: Dg Chest 2 View  Result Date: 06/14/2017 CLINICAL DATA:  Nausea, vomiting and chills. EXAM: CHEST  2 VIEW COMPARISON:  Chest radiograph 05/25/2017 FINDINGS: Lateral right basilar airspace opacities are new compared to the prior study. No other area of consolidation. No pulmonary edema. Cardiomediastinal contours are normal. No pleural effusion or pneumothorax. IMPRESSION: Opacities in the right lower lobe may indicate aspiration or developing pneumonia. Electronically Signed   By: Ulyses Jarred M.D.   On: 06/14/2017 14:21   Ct Renal Stone Study  Result Date: 06/14/2017 CLINICAL DATA:  76 year old male with a history of nausea vomiting and chills since 9 a.m. today. Previous left percutaneous nephrostomy tube performed 05/26/2017. Prior outpatient Ureteral stent 06/09/2017 EXAM: CT ABDOMEN AND PELVIS WITHOUT CONTRAST TECHNIQUE: Multidetector CT imaging of the abdomen and pelvis was  performed following the standard protocol without IV contrast. COMPARISON:  CT 05/25/2017, multiple prior CT FINDINGS: Lower chest: Mixed nodular and ground-glass opacities of the right lung base with interlobular septal thickening/interstitial thickening, more pronounced than on the CT dated 05/25/2017. No large pleural effusion. Heart size within normal limits. Coronary calcifications. Hepatobiliary: Unremarkable appearance of liver. Surgical changes of cholecystectomy. There is a dense rounded focus at the confluence of left and right hepatic biliary system, at the proximal common hepatic duct. Diameter of the common bile duct measures 14 mm. On the prior CT this radiopaque stone was in the common bile duct just above the ampulla, now traveling  retrograde. Pancreas: Unremarkable appearance of pancreas. Spleen: Unremarkable spleen Adrenals/Urinary Tract: Unremarkable bilateral adrenal glands. Right kidney atrophy with no hydronephrosis. Stones in the collecting system unchanged. Hypodense lesions at the superior cortex are unchanged from prior CT study. Hypodense lesion at the medial cortex measures 13 mm. Lesion at the anterior cortex measures 7 mm. Since the prior CT there has been interval placement of a left double-J ureteral stent with the proximal loop formed in the pelvis collecting system and the distal loop formed in the urinary bladder. Urinary bladder is decompressed with suprapubic catheter remaining in place with the balloon inflated. Small amount of gas in the urinary bladder adjacent to the catheter. There is gas within the collecting system of the left kidney with gas in the pelvis, infundibula, and the calices throughout. Significant decreased pelvicaliectasis/ hydronephrosis. Stranding surrounding the left kidney. Low-density cystic lesions on the superior cortex of the left kidney, unchanged. No percutaneous nephrostomy in place. Stomach/Bowel: Hiatal hernia. Unremarkable appearance of stomach. Duodenum diverticulum. Broad ventral Petta hernia at the level of the umbilicus containing multiple loops of small bowel. Overall the size the ventral hernia is unchanged, although there is increasing dilation of small bowel loops. No transition is identified. Mild inflammatory changes of the mesenteric at this site. Surgical changes of appendectomy. Moderate formed stool burden to the length of the colon. Surgical changes of the left colon. No inflammatory changes associated with colon. Vascular/Lymphatic: Extensive calcifications of the abdominal aorta, similar to prior. 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 Small caliber distal aorta and iliac arteries. No inflammatory changes. Dense calcifications at the origin of the  celiac artery, superior mesenteric artery, bilateral renal arteries. Reproductive: Transverse diameter of prostate measures 3.9 cm. Other: None Musculoskeletal: No acute displaced fracture. Multilevel degenerative changes of the spine, vacuum disc phenomenon at L5-S1. Degenerative changes of the bilateral hips. Early AVN at the right femoral head. IMPRESSION: Since the prior CT there is been interval placement of a left double-J ureteral stent. Gas within the left-sided collecting system and the urinary bladder. It is uncertain if this is related to manipulation of the suprapubic catheter, or if this may represent infection with gas-forming organism. No parenchymal gas identified. Interval removal of left percutaneous nephrostomy. Compared to prior CT studies there is increased ground-glass and nodular opacity at the right lung base. This is favored to represent pneumonia, potentially aspiration pneumonia/pneumonitis. Borderline dilated small bowel in the mid abdomen, with multiple small bowel loops associated with a broad ventral Stankey hernia. Uncertain if this represents an ileus or early bowel obstruction, although no transition site identified. These results were called by telephone at the time of interpretation on 06/14/2017 at 2:35 pm to Dr. Francine Graven , who verbally acknowledged these results. Mobile choledocholithiasis with no intrahepatic biliary ductal dilatation. Surgical changes of cholecystectomy. Aortic Atherosclerosis (ICD10-I70.0).  Early AVN right femoral head. Electronically Signed   By: Corrie Mckusick D.O.   On: 06/14/2017 14:36    Medications:  Prior to Admission:  Prescriptions Prior to Admission  Medication Sig Dispense Refill Last Dose  . carvedilol (COREG) 6.25 MG tablet Take 1 tablet (6.25 mg total) by mouth 2 (two) times daily with a meal.   06/14/2017 at 900a  . diazepam (VALIUM) 5 MG tablet TAKE 1 TABLET BY MOUTH TWICE A DAY 60 tablet 4 06/14/2017 at Unknown time  .  HYDROcodone-acetaminophen (NORCO) 7.5-325 MG per tablet Take 1 tablet by mouth 2 (two) times daily. Max APAP 3 GM IN 24 HOURS FROM ALL SOURCES (Patient taking differently: Take 1 tablet by mouth 2 (two) times daily. May take up to 4 times daily as needed for pain) 60 tablet 0 06/14/2017 at Unknown time  . ibuprofen (ADVIL,MOTRIN) 200 MG tablet Take 200 mg by mouth every 6 (six) hours as needed for fever.   unknown  . levothyroxine (SYNTHROID, LEVOTHROID) 88 MCG tablet Take 88 mcg by mouth daily before breakfast.   06/14/2017 at Unknown time  . potassium chloride SA (K-DUR,KLOR-CON) 20 MEQ tablet Take 20 mEq by mouth daily.   06/14/2017 at Unknown time  . solifenacin (VESICARE) 10 MG tablet Take 10 mg by mouth daily as needed (for spasms).    unknown  . sulfamethoxazole-trimethoprim (BACTRIM DS,SEPTRA DS) 800-160 MG tablet Take 1 tablet by mouth 2 (two) times daily. 10 tablet 0 06/14/2017 at Unknown time  . verapamil (CALAN) 40 MG tablet Take 40 mg by mouth 2 (two) times daily.   06/14/2017 at Unknown time  . levofloxacin (LEVAQUIN) 500 MG tablet Take 500 mg by mouth daily.    06/08/2017 at Unknown time   Scheduled: . darifenacin  15 mg Oral Daily  . diazepam  5 mg Oral BID  . heparin  5,000 Units Subcutaneous Q8H  . [START ON 06/16/2017] Influenza vac split quadrivalent PF  0.5 mL Intramuscular Tomorrow-1000  . levothyroxine  88 mcg Oral QAC breakfast  . [START ON 06/16/2017] pneumococcal 23 valent vaccine  0.5 mL Intramuscular Tomorrow-1000   Continuous: . sodium chloride 75 mL/hr at 06/15/17 0527  . piperacillin-tazobactam (ZOSYN)  IV 3.375 g (06/15/17 0527)  . potassium PHOSPHATE IVPB (mEq)    . sodium chloride    . vancomycin     MWN:UUVOZDGU (SUBLIMAZE) injection, ondansetron (ZOFRAN) IV  Assesment: He was admitted with sepsis. This could be related to urinary tract infection, pneumonia or both. He is being treated as healthcare associated pneumonia. At baseline he has had multiple renal  stones and has had multiple urological procedures. He has been septic from having an impacted kidney stone in the past. His lactate level has returned to normal  He has multiple sclerosis at baseline and as mentioned he has a suprapubic catheter.  He has acute on chronic renal failure. This will likely improve with rehydration  He has chronic systolic heart failure. Not currently having any symptoms from this  His phosphate level was low and is being replaced   Principal Problem:   Sepsis secondary to UTI [facility]) Active Problems:   PNA (pneumonia)   Multiple sclerosis (HCC)   Hypothyroidism   Acute renal failure superimposed on stage 3 chronic kidney disease (HCC)   Chronic systolic congestive heart failure (HCC)   Essential hypertension   Paroxysmal atrial tachycardia (HCC)   Hypophosphatemia    Plan: Continue IV antibiotics. Continue fluid resuscitation but watch for volume overload  LOS: 1 day   Shreyan Hinz L 06/15/2017, 7:36 AM

## 2017-06-16 LAB — BASIC METABOLIC PANEL
Anion gap: 7 (ref 5–15)
BUN: 20 mg/dL (ref 6–20)
CHLORIDE: 107 mmol/L (ref 101–111)
CO2: 22 mmol/L (ref 22–32)
Calcium: 8.1 mg/dL — ABNORMAL LOW (ref 8.9–10.3)
Creatinine, Ser: 1.89 mg/dL — ABNORMAL HIGH (ref 0.61–1.24)
GFR, EST AFRICAN AMERICAN: 39 mL/min — AB (ref >60)
GFR, EST NON AFRICAN AMERICAN: 33 mL/min — AB (ref >60)
Glucose, Bld: 94 mg/dL (ref 65–99)
POTASSIUM: 3.7 mmol/L (ref 3.5–5.1)
SODIUM: 136 mmol/L (ref 135–145)

## 2017-06-16 LAB — CBC WITH DIFFERENTIAL/PLATELET
BASOS ABS: 0 10*3/uL (ref 0.0–0.1)
Basophils Relative: 0 %
Eosinophils Absolute: 0.2 10*3/uL (ref 0.0–0.7)
Eosinophils Relative: 2 %
HCT: 33.5 % — ABNORMAL LOW (ref 39.0–52.0)
HEMOGLOBIN: 11.3 g/dL — AB (ref 13.0–17.0)
LYMPHS PCT: 13 %
Lymphs Abs: 1.7 10*3/uL (ref 0.7–4.0)
MCH: 31.4 pg (ref 26.0–34.0)
MCHC: 33.7 g/dL (ref 30.0–36.0)
MCV: 93.1 fL (ref 78.0–100.0)
MONO ABS: 0.8 10*3/uL (ref 0.1–1.0)
MONOS PCT: 6 %
NEUTROS ABS: 10.4 10*3/uL — AB (ref 1.7–7.7)
Neutrophils Relative %: 79 %
PLATELETS: 108 10*3/uL — AB (ref 150–400)
RBC: 3.6 MIL/uL — ABNORMAL LOW (ref 4.22–5.81)
RDW: 13.9 % (ref 11.5–15.5)
WBC: 13 10*3/uL — ABNORMAL HIGH (ref 4.0–10.5)

## 2017-06-16 LAB — URINE CULTURE: Culture: 20000 — AB

## 2017-06-16 MED ORDER — HYDRALAZINE HCL 20 MG/ML IJ SOLN
5.0000 mg | Freq: Four times a day (QID) | INTRAMUSCULAR | Status: DC | PRN
  Administered 2017-06-17: 5 mg via INTRAVENOUS
  Filled 2017-06-16: qty 1

## 2017-06-16 MED ORDER — FLUCONAZOLE IN SODIUM CHLORIDE 100-0.9 MG/50ML-% IV SOLN
100.0000 mg | INTRAVENOUS | Status: DC
Start: 2017-06-17 — End: 2017-06-18
  Administered 2017-06-17: 100 mg via INTRAVENOUS
  Filled 2017-06-16 (×3): qty 50

## 2017-06-16 MED ORDER — FLUCONAZOLE IN SODIUM CHLORIDE 200-0.9 MG/100ML-% IV SOLN
200.0000 mg | Freq: Once | INTRAVENOUS | Status: AC
  Administered 2017-06-16: 200 mg via INTRAVENOUS
  Filled 2017-06-16: qty 100

## 2017-06-16 MED ORDER — LOPERAMIDE HCL 2 MG PO CAPS
2.0000 mg | ORAL_CAPSULE | ORAL | Status: DC | PRN
Start: 2017-06-16 — End: 2017-06-18
  Administered 2017-06-16 – 2017-06-18 (×4): 2 mg via ORAL
  Filled 2017-06-16 (×4): qty 1

## 2017-06-16 NOTE — Plan of Care (Signed)
Problem: Nutrition: Goal: Adequate nutrition will be maintained Outcome: Progressing Pt tolerating diet well  Problem: Bowel/Gastric: Goal: Will not experience complications related to bowel motility Outcome: Progressing No c/o of diarrhea after taking am dose of immodium

## 2017-06-16 NOTE — Progress Notes (Signed)
Pharmacy Antibiotic Note  Jordan Ward is a 76 y.o. male admitted on 06/14/2017 with sepsis / UTI.   Pharmacy has been consulted for Vancomycin, zosyn, and diflucan dosing.  D/W Dr Luan Pulling.    Plan: Diflucan 200mg  x 1 today then 100mg  IV q24hrs x 2 weeks total (renally adjusted) Continue Vancomycin 500mg  IV q24hrs (renally adjusted) Check Vanc trough at steady state Continue Zosyn 3.375gm IV q8h (4 hr infusion) F/U cxs and clinical progres Monitor V/S, labs, renal fxn and levels as indicated  Height: 5\' 5"  (165.1 cm) Weight: 138 lb 7.2 oz (62.8 kg) IBW/kg (Calculated) : 61.5  Temp (24hrs), Avg:99.4 F (37.4 C), Min:98.1 F (36.7 C), Max:101 F (38.3 C)   Recent Labs Lab 06/14/17 1347 06/14/17 1348 06/14/17 1705 06/15/17 0530 06/16/17 0536  WBC 14.0*  --   --  14.8* 13.0*  CREATININE 2.66*  --   --  2.22* 1.89*  LATICACIDVEN  --  3.4* 1.4  --   --     Estimated Creatinine Clearance: 29.8 mL/min (A) (by C-G formula based on SCr of 1.89 mg/dL (H)).    Allergies  Allergen Reactions  . Tetracyclines & Related Anaphylaxis and Rash  . Ciprofloxacin     Trouble swallowing unknown reaction according to wife    Antimicrobials this admission: Vancomycin 9/16 >>  Zosyn 9/16 >>  Diflucan 9/18  Dose adjustments this admission: N/A  Microbiology results: 9/16 BCx: pending 9/16 UCx: Yeast (d/w Dr Luan Pulling), 20 K colonies 9/17 C Diff (-) 9/17 Sputum cx pending 9/16 MRSA PCR (-)  Thank you for allowing pharmacy to be a part of this patient's care.  Hart Robinsons, PharmD Clinical Pharmacist Pager:  [phone] 06/16/2017   06/16/2017 9:13 AM

## 2017-06-16 NOTE — Progress Notes (Signed)
Subjective: He says he feels a little better. He had a lot of diarrhea yesterday but he is negative for C. difficile. He says his breathing is okay. He's not coughing very much. His back pain which is a chronic problem is doing okay as well. No specific urinary symptoms. He still has some abdominal cramping.   Objective: Vital signs in last 24 hours: Temp:  [98.1 F (36.7 C)-101 F (38.3 C)] 98.1 F (36.7 C) (09/18 0400) Pulse Rate:  [51-84] 57 (09/18 0600) Resp:  [16-37] 25 (09/18 0600) BP: (107-171)/(58-81) 171/66 (09/18 0600) SpO2:  [90 %-96 %] 93 % (09/18 0600) Weight:  [62.8 kg (138 lb 7.2 oz)] 62.8 kg (138 lb 7.2 oz) (09/18 0500) Weight change: 1.111 kg (2 lb 7.2 oz) Last BM Date: 06/15/17  Intake/Output from previous day: 09/17 0701 - 09/18 0700 In: 400 [P.O.:200; IV Piggyback:200] Out: 1250 [Urine:1250]  PHYSICAL EXAM General appearance: alert, cooperative and mild distress Resp: rhonchi Right greater than left Cardio: regular rate and rhythm, S1, S2 normal, no murmur, click, rub or gallop GI: soft, non-tender; bowel sounds normal; no masses,  no organomegaly Extremities: extremities normal, atraumatic, no cyanosis or edema Skin warm and dry  Lab Results:  Results for orders placed or performed during the hospital encounter of 06/14/17 (from the past 48 hour(s))  Urinalysis, Routine w reflex microscopic     Status: Abnormal   Collection Time: 06/14/17  1:47 PM  Result Value Ref Range   Color, Urine YELLOW YELLOW   APPearance HAZY (A) CLEAR   Specific Gravity, Urine 1.017 1.005 - 1.030   pH 6.0 5.0 - 8.0   Glucose, UA NEGATIVE NEGATIVE mg/dL   Hgb urine dipstick MODERATE (A) NEGATIVE   Bilirubin Urine NEGATIVE NEGATIVE   Ketones, ur NEGATIVE NEGATIVE mg/dL   Protein, ur 100 (A) NEGATIVE mg/dL   Nitrite NEGATIVE NEGATIVE   Leukocytes, UA LARGE (A) NEGATIVE   RBC / HPF TOO NUMEROUS TO COUNT 0 - 5 RBC/hpf   WBC, UA TOO NUMEROUS TO COUNT 0 - 5 WBC/hpf   Bacteria,  UA RARE (A) NONE SEEN   Squamous Epithelial / LPF 0-5 (A) NONE SEEN   Mucus PRESENT    Budding Yeast PRESENT   Comprehensive metabolic panel     Status: Abnormal   Collection Time: 06/14/17  1:47 PM  Result Value Ref Range   Sodium 137 135 - 145 mmol/L   Potassium 4.9 3.5 - 5.1 mmol/L   Chloride 98 (L) 101 - 111 mmol/L   CO2 25 22 - 32 mmol/L   Glucose, Bld 149 (H) 65 - 99 mg/dL   BUN 24 (H) 6 - 20 mg/dL   Creatinine, Ser 2.66 (H) 0.61 - 1.24 mg/dL   Calcium 9.4 8.9 - 10.3 mg/dL   Total Protein 8.0 6.5 - 8.1 g/dL   Albumin 4.0 3.5 - 5.0 g/dL   AST 25 15 - 41 U/L   ALT 14 (L) 17 - 63 U/L   Alkaline Phosphatase 86 38 - 126 U/L   Total Bilirubin 0.8 0.3 - 1.2 mg/dL   GFR calc non Af Amer 22 (L) >60 mL/min   GFR calc Af Amer 26 (L) >60 mL/min    Comment: (NOTE) The eGFR has been calculated using the CKD EPI equation. This calculation has not been validated in all clinical situations. eGFR's persistently <60 mL/min signify possible Chronic Kidney Disease.    Anion gap 14 5 - 15  Lipase, blood     Status:  None   Collection Time: 06/14/17  1:47 PM  Result Value Ref Range   Lipase 23 11 - 51 U/L  Troponin I     Status: None   Collection Time: 06/14/17  1:47 PM  Result Value Ref Range   Troponin I <0.03 <0.03 ng/mL  CBC with Differential     Status: Abnormal   Collection Time: 06/14/17  1:47 PM  Result Value Ref Range   WBC 14.0 (H) 4.0 - 10.5 K/uL   RBC 4.79 4.22 - 5.81 MIL/uL   Hemoglobin 14.9 13.0 - 17.0 g/dL   HCT 44.6 39.0 - 52.0 %   MCV 93.1 78.0 - 100.0 fL   MCH 31.1 26.0 - 34.0 pg   MCHC 33.4 30.0 - 36.0 g/dL   RDW 13.4 11.5 - 15.5 %   Platelets 153 150 - 400 K/uL   Neutrophils Relative % 82 %   Neutro Abs 11.5 (H) 1.7 - 7.7 K/uL   Lymphocytes Relative 13 %   Lymphs Abs 1.8 0.7 - 4.0 K/uL   Monocytes Relative 3 %   Monocytes Absolute 0.4 0.1 - 1.0 K/uL   Eosinophils Relative 2 %   Eosinophils Absolute 0.3 0.0 - 0.7 K/uL   Basophils Relative 0 %   Basophils  Absolute 0.0 0.0 - 0.1 K/uL  Magnesium     Status: None   Collection Time: 06/14/17  1:47 PM  Result Value Ref Range   Magnesium 2.0 1.7 - 2.4 mg/dL  Phosphorus     Status: Abnormal   Collection Time: 06/14/17  1:47 PM  Result Value Ref Range   Phosphorus 1.7 (L) 2.5 - 4.6 mg/dL  Lactic acid, plasma     Status: Abnormal   Collection Time: 06/14/17  1:48 PM  Result Value Ref Range   Lactic Acid, Venous 3.4 (HH) 0.5 - 1.9 mmol/L    Comment: CRITICAL RESULT CALLED TO, READ BACK BY AND VERIFIED WITH: EVERETT,R AT 1435 ON 06/14/2017 BY MOSLEY,J   Culture, blood (routine x 2)     Status: None (Preliminary result)   Collection Time: 06/14/17  2:31 PM  Result Value Ref Range   Specimen Description BLOOD RIGHT HAND    Special Requests      BOTTLES DRAWN AEROBIC AND ANAEROBIC Blood Culture results may not be optimal due to an inadequate volume of blood received in culture bottles   Culture NO GROWTH < 24 HOURS    Report Status PENDING   Culture, blood (routine x 2)     Status: None (Preliminary result)   Collection Time: 06/14/17  2:34 PM  Result Value Ref Range   Specimen Description BLOOD LEFT HAND    Special Requests      BOTTLES DRAWN AEROBIC AND ANAEROBIC Blood Culture adequate volume   Culture NO GROWTH < 24 HOURS    Report Status PENDING   Strep pneumoniae urinary antigen     Status: None   Collection Time: 06/14/17  3:01 PM  Result Value Ref Range   Strep Pneumo Urinary Antigen NEGATIVE NEGATIVE    Comment:        Infection due to S. pneumoniae cannot be absolutely ruled out since the antigen present may be below the detection limit of the test. Performed at [facility], [address], [place], [place] [address]   Lactic acid, plasma     Status: None   Collection Time: 06/14/17  5:05 PM  Result Value Ref Range   Lactic Acid, Venous 1.4 0.5 -  1.9 mmol/L  MRSA PCR Screening     Status: None   Collection Time: 06/14/17  7:00 PM  Result Value Ref Range   MRSA by  PCR NEGATIVE NEGATIVE    Comment:        The GeneXpert MRSA Assay (FDA approved for NASAL specimens only), is one component of a comprehensive MRSA colonization surveillance program. It is not intended to diagnose MRSA infection nor to guide or monitor treatment for MRSA infections.   CBC WITH DIFFERENTIAL     Status: Abnormal   Collection Time: 06/15/17  5:30 AM  Result Value Ref Range   WBC 14.8 (H) 4.0 - 10.5 K/uL   RBC 4.07 (L) 4.22 - 5.81 MIL/uL   Hemoglobin 12.6 (L) 13.0 - 17.0 g/dL   HCT 38.2 (L) 39.0 - 52.0 %   MCV 93.9 78.0 - 100.0 fL   MCH 31.0 26.0 - 34.0 pg   MCHC 33.0 30.0 - 36.0 g/dL   RDW 13.8 11.5 - 15.5 %   Platelets 131 (L) 150 - 400 K/uL   Neutrophils Relative % 83 %   Neutro Abs 12.4 (H) 1.7 - 7.7 K/uL   Lymphocytes Relative 10 %   Lymphs Abs 1.5 0.7 - 4.0 K/uL   Monocytes Relative 6 %   Monocytes Absolute 0.8 0.1 - 1.0 K/uL   Eosinophils Relative 1 %   Eosinophils Absolute 0.1 0.0 - 0.7 K/uL   Basophils Relative 0 %   Basophils Absolute 0.0 0.0 - 0.1 K/uL  Basic metabolic panel     Status: Abnormal   Collection Time: 06/15/17  5:30 AM  Result Value Ref Range   Sodium 137 135 - 145 mmol/L   Potassium 4.2 3.5 - 5.1 mmol/L   Chloride 104 101 - 111 mmol/L   CO2 24 22 - 32 mmol/L   Glucose, Bld 114 (H) 65 - 99 mg/dL   BUN 25 (H) 6 - 20 mg/dL   Creatinine, Ser 2.22 (H) 0.61 - 1.24 mg/dL   Calcium 8.4 (L) 8.9 - 10.3 mg/dL   GFR calc non Af Amer 27 (L) >60 mL/min   GFR calc Af Amer 32 (L) >60 mL/min    Comment: (NOTE) The eGFR has been calculated using the CKD EPI equation. This calculation has not been validated in all clinical situations. eGFR's persistently <60 mL/min signify possible Chronic Kidney Disease.    Anion gap 9 5 - 15  C difficile quick scan w PCR reflex     Status: None   Collection Time: 06/15/17  6:27 PM  Result Value Ref Range   C Diff antigen NEGATIVE NEGATIVE   C Diff toxin NEGATIVE NEGATIVE   C Diff interpretation No C.  difficile detected.   CBC WITH DIFFERENTIAL     Status: Abnormal (Preliminary result)   Collection Time: 06/16/17  5:36 AM  Result Value Ref Range   WBC 13.0 (H) 4.0 - 10.5 K/uL   RBC 3.60 (L) 4.22 - 5.81 MIL/uL   Hemoglobin 11.3 (L) 13.0 - 17.0 g/dL   HCT 33.5 (L) 39.0 - 52.0 %   MCV 93.1 78.0 - 100.0 fL   MCH 31.4 26.0 - 34.0 pg   MCHC 33.7 30.0 - 36.0 g/dL   RDW 13.9 11.5 - 15.5 %   Platelets PENDING 150 - 400 K/uL   Neutrophils Relative % 79 %   Neutro Abs 10.4 (H) 1.7 - 7.7 K/uL   Lymphocytes Relative 13 %   Lymphs Abs 1.7 0.7 - 4.0  K/uL   Monocytes Relative 6 %   Monocytes Absolute 0.8 0.1 - 1.0 K/uL   Eosinophils Relative 2 %   Eosinophils Absolute 0.2 0.0 - 0.7 K/uL   Basophils Relative 0 %   Basophils Absolute 0.0 0.0 - 0.1 K/uL  Basic metabolic panel     Status: Abnormal   Collection Time: 06/16/17  5:36 AM  Result Value Ref Range   Sodium 136 135 - 145 mmol/L   Potassium 3.7 3.5 - 5.1 mmol/L   Chloride 107 101 - 111 mmol/L   CO2 22 22 - 32 mmol/L   Glucose, Bld 94 65 - 99 mg/dL   BUN 20 6 - 20 mg/dL   Creatinine, Ser 1.89 (H) 0.61 - 1.24 mg/dL   Calcium 8.1 (L) 8.9 - 10.3 mg/dL   GFR calc non Af Amer 33 (L) >60 mL/min   GFR calc Af Amer 39 (L) >60 mL/min    Comment: (NOTE) The eGFR has been calculated using the CKD EPI equation. This calculation has not been validated in all clinical situations. eGFR's persistently <60 mL/min signify possible Chronic Kidney Disease.    Anion gap 7 5 - 15    ABGS No results for input(s): PHART, PO2ART, TCO2, HCO3 in the last 72 hours.  Invalid input(s): PCO2 CULTURES Recent Results (from the past 240 hour(s))  Culture, blood (routine x 2)     Status: None (Preliminary result)   Collection Time: 06/14/17  2:31 PM  Result Value Ref Range Status   Specimen Description BLOOD RIGHT HAND  Final   Special Requests   Final    BOTTLES DRAWN AEROBIC AND ANAEROBIC Blood Culture results may not be optimal due to an inadequate  volume of blood received in culture bottles   Culture NO GROWTH < 24 HOURS  Final   Report Status PENDING  Incomplete  Culture, blood (routine x 2)     Status: None (Preliminary result)   Collection Time: 06/14/17  2:34 PM  Result Value Ref Range Status   Specimen Description BLOOD LEFT HAND  Final   Special Requests   Final    BOTTLES DRAWN AEROBIC AND ANAEROBIC Blood Culture adequate volume   Culture NO GROWTH < 24 HOURS  Final   Report Status PENDING  Incomplete  MRSA PCR Screening     Status: None   Collection Time: 06/14/17  7:00 PM  Result Value Ref Range Status   MRSA by PCR NEGATIVE NEGATIVE Final    Comment:        The GeneXpert MRSA Assay (FDA approved for NASAL specimens only), is one component of a comprehensive MRSA colonization surveillance program. It is not intended to diagnose MRSA infection nor to guide or monitor treatment for MRSA infections.   C difficile quick scan w PCR reflex     Status: None   Collection Time: 06/15/17  6:27 PM  Result Value Ref Range Status   C Diff antigen NEGATIVE NEGATIVE Final   C Diff toxin NEGATIVE NEGATIVE Final   C Diff interpretation No C. difficile detected.  Final   Studies/Results: Dg Chest 2 View  Result Date: 06/14/2017 CLINICAL DATA:  Nausea, vomiting and chills. EXAM: CHEST  2 VIEW COMPARISON:  Chest radiograph 05/25/2017 FINDINGS: Lateral right basilar airspace opacities are new compared to the prior study. No other area of consolidation. No pulmonary edema. Cardiomediastinal contours are normal. No pleural effusion or pneumothorax. IMPRESSION: Opacities in the right lower lobe may indicate aspiration or developing pneumonia. Electronically Signed  By: Ulyses Jarred M.D.   On: 06/14/2017 14:21   Ct Renal Stone Study  Result Date: 06/14/2017 CLINICAL DATA:  76 year old male with a history of nausea vomiting and chills since 9 a.m. today. Previous left percutaneous nephrostomy tube performed 05/26/2017. Prior  outpatient Ureteral stent 06/09/2017 EXAM: CT ABDOMEN AND PELVIS WITHOUT CONTRAST TECHNIQUE: Multidetector CT imaging of the abdomen and pelvis was performed following the standard protocol without IV contrast. COMPARISON:  CT 05/25/2017, multiple prior CT FINDINGS: Lower chest: Mixed nodular and ground-glass opacities of the right lung base with interlobular septal thickening/interstitial thickening, more pronounced than on the CT dated 05/25/2017. No large pleural effusion. Heart size within normal limits. Coronary calcifications. Hepatobiliary: Unremarkable appearance of liver. Surgical changes of cholecystectomy. There is a dense rounded focus at the confluence of left and right hepatic biliary system, at the proximal common hepatic duct. Diameter of the common bile duct measures 14 mm. On the prior CT this radiopaque stone was in the common bile duct just above the ampulla, now traveling retrograde. Pancreas: Unremarkable appearance of pancreas. Spleen: Unremarkable spleen Adrenals/Urinary Tract: Unremarkable bilateral adrenal glands. Right kidney atrophy with no hydronephrosis. Stones in the collecting system unchanged. Hypodense lesions at the superior cortex are unchanged from prior CT study. Hypodense lesion at the medial cortex measures 13 mm. Lesion at the anterior cortex measures 7 mm. Since the prior CT there has been interval placement of a left double-J ureteral stent with the proximal loop formed in the pelvis collecting system and the distal loop formed in the urinary bladder. Urinary bladder is decompressed with suprapubic catheter remaining in place with the balloon inflated. Small amount of gas in the urinary bladder adjacent to the catheter. There is gas within the collecting system of the left kidney with gas in the pelvis, infundibula, and the calices throughout. Significant decreased pelvicaliectasis/ hydronephrosis. Stranding surrounding the left kidney. Low-density cystic lesions on the  superior cortex of the left kidney, unchanged. No percutaneous nephrostomy in place. Stomach/Bowel: Hiatal hernia. Unremarkable appearance of stomach. Duodenum diverticulum. Broad ventral Hodder hernia at the level of the umbilicus containing multiple loops of small bowel. Overall the size the ventral hernia is unchanged, although there is increasing dilation of small bowel loops. No transition is identified. Mild inflammatory changes of the mesenteric at this site. Surgical changes of appendectomy. Moderate formed stool burden to the length of the colon. Surgical changes of the left colon. No inflammatory changes associated with colon. Vascular/Lymphatic: Extensive calcifications of the abdominal aorta, similar to prior. 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 Small caliber distal aorta and iliac arteries. No inflammatory changes. Dense calcifications at the origin of the celiac artery, superior mesenteric artery, bilateral renal arteries. Reproductive: Transverse diameter of prostate measures 3.9 cm. Other: None Musculoskeletal: No acute displaced fracture. Multilevel degenerative changes of the spine, vacuum disc phenomenon at L5-S1. Degenerative changes of the bilateral hips. Early AVN at the right femoral head. IMPRESSION: Since the prior CT there is been interval placement of a left double-J ureteral stent. Gas within the left-sided collecting system and the urinary bladder. It is uncertain if this is related to manipulation of the suprapubic catheter, or if this may represent infection with gas-forming organism. No parenchymal gas identified. Interval removal of left percutaneous nephrostomy. Compared to prior CT studies there is increased ground-glass and nodular opacity at the right lung base. This is favored to represent pneumonia, potentially aspiration pneumonia/pneumonitis. Borderline dilated small bowel in the mid abdomen, with multiple  small bowel loops associated with a broad  ventral Bolger hernia. Uncertain if this represents an ileus or early bowel obstruction, although no transition site identified. These results were called by telephone at the time of interpretation on 06/14/2017 at 2:35 pm to Dr. Francine Graven , who verbally acknowledged these results. Mobile choledocholithiasis with no intrahepatic biliary ductal dilatation. Surgical changes of cholecystectomy. Aortic Atherosclerosis (ICD10-I70.0). Early AVN right femoral head. Electronically Signed   By: Corrie Mckusick D.O.   On: 06/14/2017 14:36    Medications:  Prior to Admission:  Prescriptions Prior to Admission  Medication Sig Dispense Refill Last Dose  . carvedilol (COREG) 6.25 MG tablet Take 1 tablet (6.25 mg total) by mouth 2 (two) times daily with a meal.   06/14/2017 at 900a  . diazepam (VALIUM) 5 MG tablet TAKE 1 TABLET BY MOUTH TWICE A DAY 60 tablet 4 06/14/2017 at Unknown time  . HYDROcodone-acetaminophen (NORCO) 7.5-325 MG per tablet Take 1 tablet by mouth 2 (two) times daily. Max APAP 3 GM IN 24 HOURS FROM ALL SOURCES (Patient taking differently: Take 1 tablet by mouth 2 (two) times daily. May take up to 4 times daily as needed for pain) 60 tablet 0 06/14/2017 at Unknown time  . ibuprofen (ADVIL,MOTRIN) 200 MG tablet Take 200 mg by mouth every 6 (six) hours as needed for fever.   unknown  . levothyroxine (SYNTHROID, LEVOTHROID) 88 MCG tablet Take 88 mcg by mouth daily before breakfast.   06/14/2017 at Unknown time  . potassium chloride SA (K-DUR,KLOR-CON) 20 MEQ tablet Take 20 mEq by mouth daily.   06/14/2017 at Unknown time  . solifenacin (VESICARE) 10 MG tablet Take 10 mg by mouth daily as needed (for spasms).    unknown  . sulfamethoxazole-trimethoprim (BACTRIM DS,SEPTRA DS) 800-160 MG tablet Take 1 tablet by mouth 2 (two) times daily. 10 tablet 0 06/14/2017 at Unknown time  . verapamil (CALAN) 40 MG tablet Take 40 mg by mouth 2 (two) times daily.   06/14/2017 at Unknown time  . levofloxacin (LEVAQUIN)  500 MG tablet Take 500 mg by mouth daily.    06/08/2017 at Unknown time   Scheduled: . darifenacin  15 mg Oral Daily  . diazepam  5 mg Oral BID  . heparin  5,000 Units Subcutaneous Q8H  . Influenza vac split quadrivalent PF  0.5 mL Intramuscular Tomorrow-1000  . levothyroxine  88 mcg Oral QAC breakfast  . pneumococcal 23 valent vaccine  0.5 mL Intramuscular Tomorrow-1000   Continuous: . piperacillin-tazobactam (ZOSYN)  IV 3.375 g (06/16/17 0529)  . sodium chloride    . vancomycin Stopped (06/15/17 1621)   IOX:[id number], fentaNYL (SUBLIMAZE) injection, loperamide, ondansetron (ZOFRAN) IV assessment: he was admitted with sepsis with urinary tract infection and pneumonia. Cultures thus far are negative. At baseline he has multiple sclerosis and has difficulty ambulating. He has urinary retention with neurogenic bladder and has a suprapubic catheter. He has a diagnosis listed of chronic systolic CHF but his echocardiogram done yesterday shows left ventricular hypertrophy and some hypokinesis and diastolic dysfunction with normal systolic left ventricular function. His phosphate was low and has been replaced. His renal function was worse with acute on chronic renal failure but it is better. He's had fairly low urine output but he had a lot of diarrhea. He has diarrhea but does not have C. difficile. Principal Problem:   Sepsis secondary to UTI [facility]) Active Problems:   PNA (pneumonia)   Multiple sclerosis (HCC)   Hypothyroidism   Acute renal  failure superimposed on stage 3 chronic kidney disease (HCC)   Chronic systolic congestive heart failure (HCC)   Essential hypertension   Paroxysmal atrial tachycardia (HCC)   Hypophosphatemia    Plan: He will continue with IV fluids he will continue with IV antibiotics. Try to get him up in a chair today. Add Imodium for the diarrhea.     LOS: 2 days   Khole Arterburn L 06/16/2017, 7:38 AM

## 2017-06-16 NOTE — Care Management Note (Signed)
Case Management Note  Patient Details  Name: Jordan Ward MRN: [id number] Date of Birth: 03-Oct-1940  Subjective/Objective: Adm with sepsis secondary UTI/PNA. Hx of MS. From home with family, uses RW. Has suprapubic catheter. No home health PTA. Has PCP, insurance with prescription coverage.              Action/Plan: Anticipate DC home. Following for needs.   Expected Discharge Date:  06/16/17               Expected Discharge Plan:  Home/Self Care  In-House Referral:     Discharge planning Services  CM Consult  Post Acute Care Choice:    Choice offered to:     DME Arranged:    DME Agency:     HH Arranged:    HH Agency:     Status of Service:  In process, will continue to follow  If discussed at Long Length of Stay Meetings, dates discussed:    Additional Comments:  Muhamad Serano, Chauncey Reading, RN 06/16/2017, 2:17 PM

## 2017-06-17 LAB — CBC WITH DIFFERENTIAL/PLATELET
Basophils Absolute: 0 10*3/uL (ref 0.0–0.1)
Basophils Relative: 0 %
Eosinophils Absolute: 0.3 10*3/uL (ref 0.0–0.7)
Eosinophils Relative: 3 %
HEMATOCRIT: 37 % — AB (ref 39.0–52.0)
HEMOGLOBIN: 12.3 g/dL — AB (ref 13.0–17.0)
LYMPHS PCT: 16 %
Lymphs Abs: 1.8 10*3/uL (ref 0.7–4.0)
MCH: 30.7 pg (ref 26.0–34.0)
MCHC: 33.2 g/dL (ref 30.0–36.0)
MCV: 92.3 fL (ref 78.0–100.0)
MONO ABS: 0.5 10*3/uL (ref 0.1–1.0)
Monocytes Relative: 4 %
NEUTROS ABS: 8.6 10*3/uL — AB (ref 1.7–7.7)
NEUTROS PCT: 77 %
Platelets: 127 10*3/uL — ABNORMAL LOW (ref 150–400)
RBC: 4.01 MIL/uL — AB (ref 4.22–5.81)
RDW: 13.8 % (ref 11.5–15.5)
WBC: 11.3 10*3/uL — AB (ref 4.0–10.5)

## 2017-06-17 LAB — BASIC METABOLIC PANEL
ANION GAP: 9 (ref 5–15)
BUN: 17 mg/dL (ref 6–20)
CHLORIDE: 108 mmol/L (ref 101–111)
CO2: 20 mmol/L — AB (ref 22–32)
Calcium: 8.4 mg/dL — ABNORMAL LOW (ref 8.9–10.3)
Creatinine, Ser: 1.7 mg/dL — ABNORMAL HIGH (ref 0.61–1.24)
GFR calc Af Amer: 44 mL/min — ABNORMAL LOW (ref >60)
GFR calc non Af Amer: 38 mL/min — ABNORMAL LOW (ref >60)
GLUCOSE: 103 mg/dL — AB (ref 65–99)
POTASSIUM: 3.3 mmol/L — AB (ref 3.5–5.1)
Sodium: 137 mmol/L (ref 135–145)

## 2017-06-17 MED ORDER — PREDNISONE 20 MG PO TABS
40.0000 mg | ORAL_TABLET | Freq: Every day | ORAL | Status: DC
  Administered 2017-06-17: 40 mg via ORAL
  Filled 2017-06-17: qty 2

## 2017-06-17 MED ORDER — POTASSIUM CHLORIDE CRYS ER 20 MEQ PO TBCR
40.0000 meq | EXTENDED_RELEASE_TABLET | Freq: Once | ORAL | Status: AC
  Administered 2017-06-17: 40 meq via ORAL
  Filled 2017-06-17: qty 2

## 2017-06-17 MED ORDER — HYDROCODONE-ACETAMINOPHEN 7.5-325 MG PO TABS
1.0000 | ORAL_TABLET | ORAL | Status: DC | PRN
  Administered 2017-06-17 – 2017-06-18 (×3): 1 via ORAL
  Filled 2017-06-17 (×3): qty 1

## 2017-06-17 MED ORDER — HYDROCODONE-ACETAMINOPHEN 7.5-325 MG PO TABS: 1.0000 | ORAL_TABLET | Freq: Two times a day (BID) | ORAL | Status: DC

## 2017-06-17 NOTE — Progress Notes (Signed)
Called report to [facility] on [phone] who will be taking patient in room 320.

## 2017-06-17 NOTE — Progress Notes (Signed)
Subjective: He says he generally feels better but he is having some trouble with sciatica on the right side. He has this periodically at home. He has no other new complaints. His breathing is doing okay. He is coughing some. His cough is nonproductive. No urinary symptoms. No GI symptoms.  Objective: Vital signs in last 24 hours: Temp:  [97.8 F (36.6 C)-99.9 F (37.7 C)] 98.7 F (37.1 C) (09/19 0400) Pulse Rate:  [57-72] 72 (09/19 0600) Resp:  [12-34] 12 (09/19 0600) BP: (150-188)/(63-89) 161/84 (09/19 0600) SpO2:  [91 %-96 %] 94 % (09/19 0600) Weight:  [60.6 kg (133 lb 9.6 oz)] 60.6 kg (133 lb 9.6 oz) (09/19 0500) Weight change: -2.2 kg (-4 lb 13.6 oz) Last BM Date: 06/16/17  Intake/Output from previous day: 09/18 0701 - 09/19 0700 In: 500 [IV Piggyback:500] Out: 1800 [Urine:1800]  PHYSICAL EXAM General appearance: alert, cooperative and mild distress Resp: rhonchi bilaterally Cardio: regular rate and rhythm, S1, S2 normal, no murmur, click, rub or gallop GI: soft, non-tender; bowel sounds normal; no masses,  no organomegaly Extremities: extremities normal, atraumatic, no cyanosis or edema Mildly tender in the back  Lab Results:  Results for orders placed or performed during the hospital encounter of 06/14/17 (from the past 48 hour(s))  C difficile quick scan w PCR reflex     Status: None   Collection Time: 06/15/17  6:27 PM  Result Value Ref Range   C Diff antigen NEGATIVE NEGATIVE   C Diff toxin NEGATIVE NEGATIVE   C Diff interpretation No C. difficile detected.   CBC WITH DIFFERENTIAL     Status: Abnormal   Collection Time: 06/16/17  5:36 AM  Result Value Ref Range   WBC 13.0 (H) 4.0 - 10.5 K/uL   RBC 3.60 (L) 4.22 - 5.81 MIL/uL   Hemoglobin 11.3 (L) 13.0 - 17.0 g/dL   HCT 33.5 (L) 39.0 - 52.0 %   MCV 93.1 78.0 - 100.0 fL   MCH 31.4 26.0 - 34.0 pg   MCHC 33.7 30.0 - 36.0 g/dL   RDW 13.9 11.5 - 15.5 %   Platelets 108 (L) 150 - 400 K/uL    Comment: SPECIMEN  CHECKED FOR CLOTS PLATELET COUNT CONFIRMED BY SMEAR    Neutrophils Relative % 79 %   Neutro Abs 10.4 (H) 1.7 - 7.7 K/uL   Lymphocytes Relative 13 %   Lymphs Abs 1.7 0.7 - 4.0 K/uL   Monocytes Relative 6 %   Monocytes Absolute 0.8 0.1 - 1.0 K/uL   Eosinophils Relative 2 %   Eosinophils Absolute 0.2 0.0 - 0.7 K/uL   Basophils Relative 0 %   Basophils Absolute 0.0 0.0 - 0.1 K/uL  Basic metabolic panel     Status: Abnormal   Collection Time: 06/16/17  5:36 AM  Result Value Ref Range   Sodium 136 135 - 145 mmol/L   Potassium 3.7 3.5 - 5.1 mmol/L   Chloride 107 101 - 111 mmol/L   CO2 22 22 - 32 mmol/L   Glucose, Bld 94 65 - 99 mg/dL   BUN 20 6 - 20 mg/dL   Creatinine, Ser 1.89 (H) 0.61 - 1.24 mg/dL   Calcium 8.1 (L) 8.9 - 10.3 mg/dL   GFR calc non Af Amer 33 (L) >60 mL/min   GFR calc Af Amer 39 (L) >60 mL/min    Comment: (NOTE) The eGFR has been calculated using the CKD EPI equation. This calculation has not been validated in all clinical situations. eGFR's persistently <60  mL/min signify possible Chronic Kidney Disease.    Anion gap 7 5 - 15  CBC WITH DIFFERENTIAL     Status: Abnormal   Collection Time: 06/17/17  4:45 AM  Result Value Ref Range   WBC 11.3 (H) 4.0 - 10.5 K/uL   RBC 4.01 (L) 4.22 - 5.81 MIL/uL   Hemoglobin 12.3 (L) 13.0 - 17.0 g/dL   HCT 37.0 (L) 39.0 - 52.0 %   MCV 92.3 78.0 - 100.0 fL   MCH 30.7 26.0 - 34.0 pg   MCHC 33.2 30.0 - 36.0 g/dL   RDW 13.8 11.5 - 15.5 %   Platelets 127 (L) 150 - 400 K/uL   Neutrophils Relative % 77 %   Neutro Abs 8.6 (H) 1.7 - 7.7 K/uL   Lymphocytes Relative 16 %   Lymphs Abs 1.8 0.7 - 4.0 K/uL   Monocytes Relative 4 %   Monocytes Absolute 0.5 0.1 - 1.0 K/uL   Eosinophils Relative 3 %   Eosinophils Absolute 0.3 0.0 - 0.7 K/uL   Basophils Relative 0 %   Basophils Absolute 0.0 0.0 - 0.1 K/uL  Basic metabolic panel     Status: Abnormal   Collection Time: 06/17/17  4:45 AM  Result Value Ref Range   Sodium 137 135 - 145  mmol/L   Potassium 3.3 (L) 3.5 - 5.1 mmol/L   Chloride 108 101 - 111 mmol/L   CO2 20 (L) 22 - 32 mmol/L   Glucose, Bld 103 (H) 65 - 99 mg/dL   BUN 17 6 - 20 mg/dL   Creatinine, Ser 1.70 (H) 0.61 - 1.24 mg/dL   Calcium 8.4 (L) 8.9 - 10.3 mg/dL   GFR calc non Af Amer 38 (L) >60 mL/min   GFR calc Af Amer 44 (L) >60 mL/min    Comment: (NOTE) The eGFR has been calculated using the CKD EPI equation. This calculation has not been validated in all clinical situations. eGFR's persistently <60 mL/min signify possible Chronic Kidney Disease.    Anion gap 9 5 - 15    ABGS No results for input(s): PHART, PO2ART, TCO2, HCO3 in the last 72 hours.  Invalid input(s): PCO2 CULTURES Recent Results (from the past 240 hour(s))  Urine culture     Status: Abnormal   Collection Time: 06/14/17  1:47 PM  Result Value Ref Range Status   Specimen Description URINE, CLEAN CATCH  Final   Special Requests NONE  Final   Culture 20,000 COLONIES/mL YEAST (A)  Final   Report Status 06/16/2017 FINAL  Final  Culture, blood (routine x 2)     Status: None (Preliminary result)   Collection Time: 06/14/17  2:31 PM  Result Value Ref Range Status   Specimen Description BLOOD RIGHT HAND  Final   Special Requests   Final    BOTTLES DRAWN AEROBIC AND ANAEROBIC Blood Culture results may not be optimal due to an inadequate volume of blood received in culture bottles   Culture NO GROWTH 3 DAYS  Final   Report Status PENDING  Incomplete  Culture, blood (routine x 2)     Status: None (Preliminary result)   Collection Time: 06/14/17  2:34 PM  Result Value Ref Range Status   Specimen Description BLOOD LEFT HAND  Final   Special Requests   Final    BOTTLES DRAWN AEROBIC AND ANAEROBIC Blood Culture adequate volume   Culture NO GROWTH 3 DAYS  Final   Report Status PENDING  Incomplete  MRSA PCR Screening  Status: None   Collection Time: 06/14/17  7:00 PM  Result Value Ref Range Status   MRSA by PCR NEGATIVE NEGATIVE  Final    Comment:        The GeneXpert MRSA Assay (FDA approved for NASAL specimens only), is one component of a comprehensive MRSA colonization surveillance program. It is not intended to diagnose MRSA infection nor to guide or monitor treatment for MRSA infections.   C difficile quick scan w PCR reflex     Status: None   Collection Time: 06/15/17  6:27 PM  Result Value Ref Range Status   C Diff antigen NEGATIVE NEGATIVE Final   C Diff toxin NEGATIVE NEGATIVE Final   C Diff interpretation No C. difficile detected.  Final   Studies/Results: No results found.  Medications:  Prior to Admission:  Prescriptions Prior to Admission  Medication Sig Dispense Refill Last Dose  . carvedilol (COREG) 6.25 MG tablet Take 1 tablet (6.25 mg total) by mouth 2 (two) times daily with a meal.   06/14/2017 at 900a  . diazepam (VALIUM) 5 MG tablet TAKE 1 TABLET BY MOUTH TWICE A DAY 60 tablet 4 06/14/2017 at Unknown time  . HYDROcodone-acetaminophen (NORCO) 7.5-325 MG per tablet Take 1 tablet by mouth 2 (two) times daily. Max APAP 3 GM IN 24 HOURS FROM ALL SOURCES (Patient taking differently: Take 1 tablet by mouth 2 (two) times daily. May take up to 4 times daily as needed for pain) 60 tablet 0 06/14/2017 at Unknown time  . ibuprofen (ADVIL,MOTRIN) 200 MG tablet Take 200 mg by mouth every 6 (six) hours as needed for fever.   unknown  . levothyroxine (SYNTHROID, LEVOTHROID) 88 MCG tablet Take 88 mcg by mouth daily before breakfast.   06/14/2017 at Unknown time  . potassium chloride SA (K-DUR,KLOR-CON) 20 MEQ tablet Take 20 mEq by mouth daily.   06/14/2017 at Unknown time  . solifenacin (VESICARE) 10 MG tablet Take 10 mg by mouth daily as needed (for spasms).    unknown  . sulfamethoxazole-trimethoprim (BACTRIM DS,SEPTRA DS) 800-160 MG tablet Take 1 tablet by mouth 2 (two) times daily. 10 tablet 0 06/14/2017 at Unknown time  . verapamil (CALAN) 40 MG tablet Take 40 mg by mouth 2 (two) times daily.   06/14/2017  at Unknown time  . levofloxacin (LEVAQUIN) 500 MG tablet Take 500 mg by mouth daily.    06/08/2017 at Unknown time   Scheduled: . darifenacin  15 mg Oral Daily  . diazepam  5 mg Oral BID  . heparin  5,000 Units Subcutaneous Q8H  . Influenza vac split quadrivalent PF  0.5 mL Intramuscular Tomorrow-1000  . levothyroxine  88 mcg Oral QAC breakfast  . potassium chloride  40 mEq Oral Once  . predniSONE  40 mg Oral Q breakfast   Continuous: . fluconazole (DIFLUCAN) IV    . piperacillin-tazobactam (ZOSYN)  IV 3.375 g (06/17/17 0538)  . sodium chloride    . vancomycin Stopped (06/16/17 1718)   OTL:[id number], fentaNYL (SUBLIMAZE) injection, hydrALAZINE, HYDROcodone-acetaminophen, loperamide, ondansetron (ZOFRAN) IV  Assesment: He was admitted with sepsis presumably secondary to urinary tract infection. He is growing 20,000 colonies of yeast in his urine. He has what appears to be pneumonia on chest x-ray and he's been treated for healthcare associated pneumonia. He is improving. His phosphate was low on admission and has been replaced. At baseline he has multiple sclerosis which is about the same. At baseline he has problems with his back and his back is giving  him trouble again. He has had acute kidney injury superimposed on stage III chronic kidney disease and his renal function is much improved. His potassium is low this morning and will be replaced. Principal Problem:   Sepsis secondary to UTI [facility]) Active Problems:   PNA (pneumonia)   Multiple sclerosis (HCC)   Hypothyroidism   Acute renal failure superimposed on stage 3 chronic kidney disease (HCC)   Chronic systolic congestive heart failure (HCC)   Essential hypertension   Paroxysmal atrial tachycardia (HCC)   Hypophosphatemia    Plan: He is okay to transfer out of ICU. PT consultation for his back. Prednisone for his back. Replace his potassium. Continue Diflucan and IV antibiotics    LOS: 3 days   Makesha Belitz  L 06/17/2017, 7:52 AM

## 2017-06-17 NOTE — Evaluation (Signed)
Physical Therapy Evaluation Patient Details Name: Jordan Ward MRN: [id number] DOB: 1941/05/19 Today's Date: 06/17/2017   History of Present Illness  Jordan Ward is a 76 y.o. male with medical history significant of anemia, arthritis, chronic back pain, bilateral carotid bruits, history of C. difficile colitis, CAD, cerebrovascular disease, colon polyps, hyperlipidemia, poststroke dysphagia, gait disorder, history of headaches, history of high-grade dysplasia colonic adenoma, hypertension, hypothyroidism, multiple sclerosis, urolithiasis, pulmonary fibrosis, pulmonary nodules, sleep apnea, TIA, OSA, paroxysmal atrial tachycardia, pulmonary fibrosis, sleep, sacral ulcer, PVD, urolithiasis S/P suprapubic catheter, left ureteral stent placement 5 days ago who is brought to the emergency department due nausea, multiple episodes of emesis and rigors since this morning.    Clinical Impression  Patient demonstrates good return for sitting up at bedside, no loss of balance during gait training, tolerated sitting up in chair and later transferred to [facility].  Patient limited for functional mobility as stated below secondary to BLE weakness, fatigue and poor standing balance.  Patient will benefit from continued physical therapy in hospital and recommended venue below to increase strength, balance, endurance for safe ADLs and gait.    Follow Up Recommendations Home health PT;Supervision/Assistance - 24 hour    Equipment Recommendations  None recommended by PT    Recommendations for Other Services       Precautions / Restrictions Precautions Precautions: Fall Restrictions Weight Bearing Restrictions: No      Mobility  Bed Mobility Overal bed mobility: Needs Assistance Bed Mobility: Supine to Sit;Sit to Supine     Supine to sit: Supervision Sit to supine: Supervision      Transfers Overall transfer level: Needs assistance Equipment used: Rolling walker (2 wheeled);1 person hand held  assist Transfers: Sit to/from [facility] Sit to Stand: Min guard Stand pivot transfers: Min guard          Ambulation/Gait Ambulation/Gait assistance: Min guard Ambulation Distance (Feet): 75 Feet Assistive device: Rolling walker (2 wheeled) Gait Pattern/deviations: Decreased step length - right;Decreased step length - left;Decreased stride length   Gait velocity interpretation: Below normal speed for age/gender General Gait Details: Patient demonstrates slightly labored cadence without loss of balance, limited secondary to fatigue  Stairs            Wheelchair Mobility    Modified Rankin (Stroke Patients Only)       Balance Overall balance assessment: Needs assistance Sitting-balance support: No upper extremity supported;Feet supported Sitting balance-Leahy Scale: Good     Standing balance support: Bilateral upper extremity supported;During functional activity Standing balance-Leahy Scale: Fair                               Pertinent Vitals/Pain Pain Assessment: 0-10 Pain Score: 6  Pain Location: right buttock with radiation down to toes and numbness in toes, possibly due to sciatic per patient Pain Descriptors / Indicators: Radiating;Numbness;Sharp Pain Intervention(s): Limited activity within patient's tolerance;Monitored during session    Home Living Family/patient expects to be discharged to:: Private residence Living Arrangements: Spouse/significant other;Children Available Help at Discharge: Family Type of Home: House Home Access: Stairs to enter Entrance Stairs-Rails: Right Entrance Stairs-Number of Steps: 2 Home Layout: One [place]: Environmental consultant - 2 wheels;Cane - single point;Other (comment) Additional Comments: also has a forearm crutch due to MS per patient    Prior Function Level of Independence: Independent with assistive device(s)  Hand Dominance   Dominant Hand: Left     Extremity/Trunk Assessment   Upper Extremity Assessment Upper Extremity Assessment: Generalized weakness    Lower Extremity Assessment Lower Extremity Assessment: Generalized weakness    Cervical / Trunk Assessment Cervical / Trunk Assessment: Kyphotic  Communication   Communication: No difficulties  Cognition Arousal/Alertness: Awake/alert Behavior During Therapy: WFL for tasks assessed/performed Overall Cognitive Status: Within Functional Limits for tasks assessed                                        General Comments      Exercises General Exercises - Lower Extremity Ankle Circles/Pumps: Seated;AROM;Both;10 reps Long Arc Quad: Seated;AROM;Both;10 reps Hip Flexion/Marching: Seated;AROM;Both;10 reps   Assessment/Plan    PT Assessment Patient needs continued PT services  PT Problem List Decreased strength;Decreased activity tolerance;Decreased balance;Decreased mobility       PT Treatment Interventions Gait training;Stair training;Functional mobility training;Therapeutic activities;Therapeutic exercise;Patient/family education    PT Goals (Current goals can be found in the Care Plan section)  Acute Rehab PT Goals Patient Stated Goal: return home at [facility] with family to assist PT Goal Formulation: With patient Time For Goal Achievement: 06/22/17 Potential to Achieve Goals: Good    Frequency Min 3X/week   Barriers to discharge        Co-evaluation               AM-PAC PT "6 Clicks" Daily Activity  Outcome Measure Difficulty turning over in bed (including adjusting bedclothes, sheets and blankets)?: None Difficulty moving from lying on back to sitting on the side of the bed? : None Difficulty sitting down on and standing up from a chair with arms (e.g., wheelchair, bedside commode, etc,.)?: None Help needed moving to and from a bed to chair (including a wheelchair)?: A Little Help needed walking in hospital room?: A Little Help needed  climbing 3-5 steps with a railing? : A Little 6 Click Score: 21    End of Session   Activity Tolerance: Patient tolerated treatment well;Patient limited by fatigue Patient left: in chair;with call bell/phone within reach Nurse Communication: Mobility status PT Visit Diagnosis: Unsteadiness on feet (R26.81);Other abnormalities of gait and mobility (R26.89);Muscle weakness (generalized) (M62.81)    Time: 3267-1245 PT Time Calculation (min) (ACUTE ONLY): 36 min   Charges:   PT Evaluation $PT Eval Low Complexity: 1 Low PT Treatments $Therapeutic Activity: 23-37 mins   PT G Codes:        3:21 PM, 07-07-17 Lonell Grandchild, MPT Physical Therapist with [facility] [phone] office [phone] mobile phone

## 2017-06-18 LAB — CBC WITH DIFFERENTIAL/PLATELET
BASOS ABS: 0 10*3/uL (ref 0.0–0.1)
Basophils Relative: 0 %
Eosinophils Absolute: 0 10*3/uL (ref 0.0–0.7)
Eosinophils Relative: 0 %
HEMATOCRIT: 38.2 % — AB (ref 39.0–52.0)
HEMOGLOBIN: 12.9 g/dL — AB (ref 13.0–17.0)
LYMPHS PCT: 17 %
Lymphs Abs: 1.3 10*3/uL (ref 0.7–4.0)
MCH: 31.3 pg (ref 26.0–34.0)
MCHC: 33.8 g/dL (ref 30.0–36.0)
MCV: 92.7 fL (ref 78.0–100.0)
MONO ABS: 0.5 10*3/uL (ref 0.1–1.0)
MONOS PCT: 6 %
NEUTROS ABS: 6.1 10*3/uL (ref 1.7–7.7)
NEUTROS PCT: 77 %
Platelets: 161 10*3/uL (ref 150–400)
RBC: 4.12 MIL/uL — ABNORMAL LOW (ref 4.22–5.81)
RDW: 13.6 % (ref 11.5–15.5)
WBC: 7.9 10*3/uL (ref 4.0–10.5)

## 2017-06-18 LAB — BASIC METABOLIC PANEL
ANION GAP: 7 (ref 5–15)
BUN: 24 mg/dL — AB (ref 6–20)
CHLORIDE: 105 mmol/L (ref 101–111)
CO2: 23 mmol/L (ref 22–32)
Calcium: 8.9 mg/dL (ref 8.9–10.3)
Creatinine, Ser: 1.63 mg/dL — ABNORMAL HIGH (ref 0.61–1.24)
GFR calc Af Amer: 46 mL/min — ABNORMAL LOW (ref >60)
GFR, EST NON AFRICAN AMERICAN: 40 mL/min — AB (ref >60)
Glucose, Bld: 117 mg/dL — ABNORMAL HIGH (ref 65–99)
POTASSIUM: 4.2 mmol/L (ref 3.5–5.1)
SODIUM: 135 mmol/L (ref 135–145)

## 2017-06-18 MED ORDER — FLUCONAZOLE 100 MG PO TABS
100.0000 mg | ORAL_TABLET | Freq: Every day | ORAL | 0 refills | Status: AC
Start: 2017-06-18 — End: 2017-06-28

## 2017-06-18 MED ORDER — AMOXICILLIN-POT CLAVULANATE 875-125 MG PO TABS: 1.0000 | ORAL_TABLET | Freq: Two times a day (BID) | ORAL | 0 refills | Status: AC

## 2017-06-18 MED ORDER — PREDNISONE 10 MG (21) PO TBPK: ORAL_TABLET | ORAL | 0 refills | Status: DC

## 2017-06-18 NOTE — Discharge Summary (Signed)
Physician Discharge Summary  Patient ID: Jordan Ward MRN: [id number] DOB/AGE: 1941-04-10 76 y.o. Primary Care Physician:Yanis Larin, Percell Miller, MD Admit date: 06/14/2017 Discharge date: 06/18/2017    Discharge Diagnoses:   Principal Problem:   Sepsis secondary to UTI [facility]) Active Problems:   PNA (pneumonia)   Multiple sclerosis ([place])   Lower urinary tract infectious disease   Chronic suprapubic catheter (HCC)   Hypothyroidism   Sepsis ([place])   Acute renal failure superimposed on stage 3 chronic kidney disease (HCC)   Chronic diastolic (congestive) heart failure (HCC)   Essential hypertension   CKD (chronic kidney disease), stage III   Paroxysmal atrial tachycardia (HCC)   Hypophosphatemia Hypokalemia  Allergies as of 06/18/2017      Reactions   Tetracyclines & Related Anaphylaxis, Rash   Ciprofloxacin    Trouble swallowing unknown reaction according to wife       Medication List    STOP taking these medications   levofloxacin 500 MG tablet Commonly known as:  LEVAQUIN   sulfamethoxazole-trimethoprim 800-160 MG tablet Commonly known as:  BACTRIM DS,SEPTRA DS     TAKE these medications   amoxicillin-clavulanate 875-125 MG tablet Commonly known as:  AUGMENTIN Take 1 tablet by mouth 2 (two) times daily.   carvedilol 6.25 MG tablet Commonly known as:  COREG Take 1 tablet (6.25 mg total) by mouth 2 (two) times daily with a meal.   diazepam 5 MG tablet Commonly known as:  VALIUM TAKE 1 TABLET BY MOUTH TWICE A DAY   fluconazole 100 MG tablet Commonly known as:  DIFLUCAN Take 1 tablet (100 mg total) by mouth daily.   HYDROcodone-acetaminophen 7.5-325 MG tablet Commonly known as:  NORCO Take 1 tablet by mouth 2 (two) times daily. Max APAP 3 GM IN 24 HOURS FROM ALL SOURCES What changed:  additional instructions   ibuprofen 200 MG tablet Commonly known as:  ADVIL,MOTRIN Take 200 mg by mouth every 6 (six) hours as needed for fever.   levothyroxine 88 MCG  tablet Commonly known as:  SYNTHROID, LEVOTHROID Take 88 mcg by mouth daily before breakfast.   potassium chloride SA 20 MEQ tablet Commonly known as:  K-DUR,KLOR-CON Take 20 mEq by mouth daily.   predniSONE 10 MG (21) Tbpk tablet Commonly known as:  STERAPRED UNI-PAK 21 TAB Take by package instructions   solifenacin 10 MG tablet Commonly known as:  VESICARE Take 10 mg by mouth daily as needed (for spasms).   verapamil 40 MG tablet Commonly known as:  CALAN Take 40 mg by mouth 2 (two) times daily.            Discharge Care Instructions        Start     Ordered   06/18/17 0000  amoxicillin-clavulanate (AUGMENTIN) 875-125 MG tablet  2 times daily     06/18/17 0756   06/18/17 0000  fluconazole (DIFLUCAN) 100 MG tablet  Daily     06/18/17 0756   06/18/17 0000  predniSONE (STERAPRED UNI-PAK 21 TAB) 10 MG (21) TBPK tablet     06/18/17 0756      Discharged Condition:Improved    Consults: Urology  Significant Diagnostic Studies: Dg Chest 2 View  Result Date: 06/14/2017 CLINICAL DATA:  Nausea, vomiting and chills. EXAM: CHEST  2 VIEW COMPARISON:  Chest radiograph 05/25/2017 FINDINGS: Lateral right basilar airspace opacities are new compared to the prior study. No other area of consolidation. No pulmonary edema. Cardiomediastinal contours are normal. No pleural effusion or pneumothorax. IMPRESSION: Opacities in the right  lower lobe may indicate aspiration or developing pneumonia. Electronically Signed   By: Ulyses Jarred M.D.   On: 06/14/2017 14:21   Dg Chest Portable 1 View  Result Date: 05/26/2017 CLINICAL DATA:  Sepsis tachycardia EXAM: PORTABLE CHEST 1 VIEW COMPARISON:  08/03/2015, CT abdomen pelvis 05/25/2017 FINDINGS: Coarse bibasilar interstitial opacity left greater than right likely due to chronic interstitial change. No focal consolidation or effusion. Stable cardiomediastinal silhouette. Aortic atherosclerosis. No pneumothorax. IMPRESSION: Coarse left greater than  right interstitial bibasilar opacity, likely due to fibrosis. No acute consolidation or effusion. Electronically Signed   By: Donavan Foil M.D.   On: 05/26/2017 00:55   Dg C-arm 1-60 Min-no Report  Result Date: 06/09/2017 Fluoroscopy was utilized by the requesting physician.  No radiographic interpretation.   Ct Renal Stone Study  Result Date: 06/14/2017 CLINICAL DATA:  76 year old male with a history of nausea vomiting and chills since 9 a.m. today. Previous left percutaneous nephrostomy tube performed 05/26/2017. Prior outpatient Ureteral stent 06/09/2017 EXAM: CT ABDOMEN AND PELVIS WITHOUT CONTRAST TECHNIQUE: Multidetector CT imaging of the abdomen and pelvis was performed following the standard protocol without IV contrast. COMPARISON:  CT 05/25/2017, multiple prior CT FINDINGS: Lower chest: Mixed nodular and ground-glass opacities of the right lung base with interlobular septal thickening/interstitial thickening, more pronounced than on the CT dated 05/25/2017. No large pleural effusion. Heart size within normal limits. Coronary calcifications. Hepatobiliary: Unremarkable appearance of liver. Surgical changes of cholecystectomy. There is a dense rounded focus at the confluence of left and right hepatic biliary system, at the proximal common hepatic duct. Diameter of the common bile duct measures 14 mm. On the prior CT this radiopaque stone was in the common bile duct just above the ampulla, now traveling retrograde. Pancreas: Unremarkable appearance of pancreas. Spleen: Unremarkable spleen Adrenals/Urinary Tract: Unremarkable bilateral adrenal glands. Right kidney atrophy with no hydronephrosis. Stones in the collecting system unchanged. Hypodense lesions at the superior cortex are unchanged from prior CT study. Hypodense lesion at the medial cortex measures 13 mm. Lesion at the anterior cortex measures 7 mm. Since the prior CT there has been interval placement of a left double-J ureteral stent with  the proximal loop formed in the pelvis collecting system and the distal loop formed in the urinary bladder. Urinary bladder is decompressed with suprapubic catheter remaining in place with the balloon inflated. Small amount of gas in the urinary bladder adjacent to the catheter. There is gas within the collecting system of the left kidney with gas in the pelvis, infundibula, and the calices throughout. Significant decreased pelvicaliectasis/ hydronephrosis. Stranding surrounding the left kidney. Low-density cystic lesions on the superior cortex of the left kidney, unchanged. No percutaneous nephrostomy in place. Stomach/Bowel: Hiatal hernia. Unremarkable appearance of stomach. Duodenum diverticulum. Broad ventral Seefeldt hernia at the level of the umbilicus containing multiple loops of small bowel. Overall the size the ventral hernia is unchanged, although there is increasing dilation of small bowel loops. No transition is identified. Mild inflammatory changes of the mesenteric at this site. Surgical changes of appendectomy. Moderate formed stool burden to the length of the colon. Surgical changes of the left colon. No inflammatory changes associated with colon. Vascular/Lymphatic: Extensive calcifications of the abdominal aorta, similar to prior. 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 Small caliber distal aorta and iliac arteries. No inflammatory changes. Dense calcifications at the origin of the celiac artery, superior mesenteric artery, bilateral renal arteries. Reproductive: Transverse diameter of prostate measures 3.9 cm. Other: None Musculoskeletal: No  acute displaced fracture. Multilevel degenerative changes of the spine, vacuum disc phenomenon at L5-S1. Degenerative changes of the bilateral hips. Early AVN at the right femoral head. IMPRESSION: Since the prior CT there is been interval placement of a left double-J ureteral stent. Gas within the left-sided collecting system and the  urinary bladder. It is uncertain if this is related to manipulation of the suprapubic catheter, or if this may represent infection with gas-forming organism. No parenchymal gas identified. Interval removal of left percutaneous nephrostomy. Compared to prior CT studies there is increased ground-glass and nodular opacity at the right lung base. This is favored to represent pneumonia, potentially aspiration pneumonia/pneumonitis. Borderline dilated small bowel in the mid abdomen, with multiple small bowel loops associated with a broad ventral Wilshire hernia. Uncertain if this represents an ileus or early bowel obstruction, although no transition site identified. These results were called by telephone at the time of interpretation on 06/14/2017 at 2:35 pm to Dr. Francine Graven , who verbally acknowledged these results. Mobile choledocholithiasis with no intrahepatic biliary ductal dilatation. Surgical changes of cholecystectomy. Aortic Atherosclerosis (ICD10-I70.0). Early AVN right femoral head. Electronically Signed   By: Corrie Mckusick D.O.   On: 06/14/2017 14:36   Ct Renal Stone Study  Result Date: 05/25/2017 CLINICAL DATA:  Left back pain with fever and hematuria EXAM: CT ABDOMEN AND PELVIS WITHOUT CONTRAST TECHNIQUE: Multidetector CT imaging of the abdomen and pelvis was performed following the standard protocol without IV contrast. COMPARISON:  11/01/2016, 11/20/2016, 08/23/2016, 08/04/2015 FINDINGS: Lower chest: Patchy dependent atelectasis. No focal consolidation or pleural effusion. Coronary artery calcification. Hepatobiliary: No focal hepatic abnormality. Surgical clips in the gallbladder fossa. Stable enlarged extrahepatic bile duct, measuring up to 15 mm. Hyperdense material or stones within the distal bile duct near the head of pancreas as before. Pancreas: Unremarkable. No pancreatic ductal dilatation or surrounding inflammatory changes. Spleen: Normal in size without focal abnormality. Adrenals/Urinary  Tract: Adrenal glands are within normal limits. Atrophic right kidney with intrarenal calculi. 11 mm cyst anterior right kidney. 6 mm stone lower pole right kidney. No ureteral stone on the right. Severe hydronephrosis and hydroureter of the left kidney, worse since prior CT. Increased left perinephric fat stranding. 6 mm stone in the distal left ureter, about 6 cm cephalad to the left the UVJ. Suprapubic catheter in the bladder which is decompressed. Bladder is thick walled. Single large or multiple stones within the posterior aspect of the bladder measuring 3.1 cm transverse aggregate. Stomach/Bowel: Stomach is nonenlarged. No dilated small bowel. Status post appendectomy. No colon Lembo thickening. Postsurgical changes of the distal colon. Vascular/Lymphatic: Moderate severe atherosclerotic calcification of the aorta. Severe narrowing and tapering of the distal abdominal aorta to minimum diameter of 9 mm AP with diminutive appearing iliac vessels. Retroperitoneal adenopathy with lymph nodes measuring up to 11 mm. Reproductive: No masses Other: Negative for free air or free fluid. Anterior abdominal Brodrick laxity with protrusion of intra-abdominal fat and bowel anteriorly. Musculoskeletal: Degenerative changes. No acute or suspicious bone lesion. IMPRESSION: 1. Severe left hydronephrosis and hydroureter, increased compared to prior CT. Worsens left perinephric fat stranding. 6 mm stone in the distal left ureter, about 6 cm cephalad to the left UVJ. 2. Atrophic right kidney containing a nonobstructing stone. Suprapubic catheter in place with decompressed bladder. Single large or multiple adjacent stones within the dependent portion of the bladder 3. Stable enlarged extrahepatic bile duct with hyperdense material or stones in the distal common duct as before consistent with choledocholithiasis. 4. Atherosclerotic  vascular disease with severe stenosis of the distal abdominal aorta and aortic bifurcation with diminutive  appearing iliac vessels. Electronically Signed   By: Donavan Foil M.D.   On: 05/25/2017 22:46   Ir Nephrostomy Placement Left  Result Date: 05/26/2017 INDICATION: 76 year old male with a functionally solitary left kidney, neurogenic bladder with chronic indwelling suprapubic catheter, an obstructed left hydronephrosis secondary to a distal ureteral stone. He is not a candidate for retrograde double-J ureteral stent placement given his neurogenic bladder and suprapubic catheter. He has evidence of obstructed uropathy with increasing renal function as well as probable pyonephrosis. He requires urgent percutaneous nephrostomy tube for decompression. EXAM: IR NEPHROSTOMY PLACEMENT LEFT COMPARISON:  None. MEDICATIONS: 2 g Rocephin; The antibiotic was administered in an appropriate time frame prior to skin puncture. ANESTHESIA/SEDATION: Fentanyl 25 mcg IV; Versed 1.5 mg IV Moderate Sedation Time:  14 minutes The patient was continuously monitored during the procedure by the interventional radiology nurse under my direct supervision. CONTRAST:  10 mL Isovue 370 - administered into the collecting system(s) FLUOROSCOPY TIME:  Fluoroscopy Time: 0 minutes 24 seconds (3 mGy). COMPLICATIONS: None immediate. TECHNIQUE: The procedure, risks, benefits, and alternatives were explained to the patient. Questions regarding the procedure were encouraged and answered. The patient understands and consents to the procedure. The left flank was prepped with chlorhexidine in a sterile fashion, and a sterile drape was applied covering the operative field. A sterile gown and sterile gloves were used for the procedure. Local anesthesia was provided with 1% Lidocaine. The left flank was interrogated with ultrasound and the left kidney identified. The kidney is hydronephrotic. A suitable access site on the skin overlying the lower pole, posterior calix was identified. After local mg anesthesia was achieved, a small skin nick was made with an  11 blade scalpel. An 18 gauge trocar needle was then advanced under direct sonographic guidance into the lower pole of the left kidney. Gentle hand injection of contrast material confirms placement of the needle within the renal collecting system via a posterior inter pole calyx. There is marked hydronephrosis. A short 0.035 inch Amplatz wire was then advanced through the needle and coiled in the renal collecting system. The needle was removed. The skin tract was dilated to 10 [place] and a Cook 10.2 [place] all-purpose drainage catheter was advanced over the wire and positioned within the renal pelvis. The catheter was connected to gravity bag drainage and secured to the skin with 0 Prolene suture. Catheter was left to gravity bag drainage. IMPRESSION: Successful placement of a left 10 French percutaneous nephrostomy tube. Signed, Criselda Peaches, MD [facility] Electronically Signed   By: Jacqulynn Cadet M.D.   On: 05/26/2017 15:06    Lab Results: Basic Metabolic Panel:  Recent Labs  06/17/17 0445 06/18/17 0603  NA 137 135  K 3.3* 4.2  CL 108 105  CO2 20* 23  GLUCOSE 103* 117*  BUN 17 24*  CREATININE 1.70* 1.63*  CALCIUM 8.4* 8.9   Liver Function Tests: No results for input(s): AST, ALT, ALKPHOS, BILITOT, PROT, ALBUMIN in the last 72 hours.   CBC:  Recent Labs  06/17/17 0445 06/18/17 0603  WBC 11.3* 7.9  NEUTROABS 8.6* 6.1  HGB 12.3* 12.9*  HCT 37.0* 38.2*  MCV 92.3 92.7  PLT 127* 161    Recent Results (from the past 240 hour(s))  Urine culture     Status: Abnormal   Collection Time: 06/14/17  1:47 PM  Result Value Ref  Range Status   Specimen Description URINE, CLEAN CATCH  Final   Special Requests NONE  Final   Culture 20,000 COLONIES/mL YEAST (A)  Final   Report Status 06/16/2017 FINAL  Final  Culture, blood (routine x 2)     Status: None (Preliminary result)   Collection Time: 06/14/17  2:31 PM  Result  Value Ref Range Status   Specimen Description BLOOD RIGHT HAND  Final   Special Requests   Final    BOTTLES DRAWN AEROBIC AND ANAEROBIC Blood Culture results may not be optimal due to an inadequate volume of blood received in culture bottles   Culture NO GROWTH 4 DAYS  Final   Report Status PENDING  Incomplete  Culture, blood (routine x 2)     Status: None (Preliminary result)   Collection Time: 06/14/17  2:34 PM  Result Value Ref Range Status   Specimen Description BLOOD LEFT HAND  Final   Special Requests   Final    BOTTLES DRAWN AEROBIC AND ANAEROBIC Blood Culture adequate volume   Culture NO GROWTH 4 DAYS  Final   Report Status PENDING  Incomplete  MRSA PCR Screening     Status: None   Collection Time: 06/14/17  7:00 PM  Result Value Ref Range Status   MRSA by PCR NEGATIVE NEGATIVE Final    Comment:        The GeneXpert MRSA Assay (FDA approved for NASAL specimens only), is one component of a comprehensive MRSA colonization surveillance program. It is not intended to diagnose MRSA infection nor to guide or monitor treatment for MRSA infections.   C difficile quick scan w PCR reflex     Status: None   Collection Time: 06/15/17  6:27 PM  Result Value Ref Range Status   C Diff antigen NEGATIVE NEGATIVE Final   C Diff toxin NEGATIVE NEGATIVE Final   C Diff interpretation No C. difficile detected.  Final     Hospital Course: This is a 76 year old who has multiple medical problems including anemia, arthritis, chronic back pain, coronary artery occlusive disease, cerebrovascular disease, history of stroke with dysphagia, hypertension, hypothyroidism, multiple sclerosis, urolithiasis, sleep apnea, pulmonary fibrosis, paroxysmal atrial tachycardia and who underwent a left ureteral stent placement 5 days ago. He came to the emergency department because of nausea multiple episodes of emesis and fever and chills. He has a chronic indwelling suprapubic urinary catheter. He was felt to be  septic on admission and was admitted to stepdown. He was given IV fluids started on broad-spectrum antibiotics. He had cultures done. He also had what looked like pneumonia on chest x-ray. He was treated as healthcare associated pneumonia. His urine culture grew 20,000 colonies of yeast. He was started on Diflucan. He showed marked improvement over the several days he was in the hospital. He was able to get up and walk around and was back at his baseline. His blood pressure was better. He was afebrile. He had acute on chronic renal failure and his renal function was back to baseline at time of discharge. He had some low potassium which was replaced and that was normal at the time of discharge. Although he has a previous diagnosis of pulmonary fibrosis this was not evident on his chest x-ray or CT at this admission so that may be an erroneous diagnosis. This can be further worked up as an outpatient.  Discharge Exam: Blood pressure 135/85, pulse (!) 57, temperature 98 F (36.7 C), temperature source Oral, resp. rate 20, height 5\' 5"  (  1.651 m), weight 60.6 kg (133 lb 9.6 oz), SpO2 99 %. He's awake and alert. His chest is much clearer. He is still weak but he was able to ambulate in the halls before discharge.  Disposition: Home. Home health physical therapy was recommended but he declines.      Signed: Garvey Westcott L   06/18/2017, 8:22 AM

## 2017-06-18 NOTE — Progress Notes (Signed)
Subjective: He says he feels better and wants to go home. He has improved dramatically in the last 48 hours. He says he still has some nausea but he thinks it's because he's not very tolerant of the hospital food. He's not having any shortness of breath more than usual. No increase in pain. No urinary symptoms.  Objective: Vital signs in last 24 hours: Temp:  [98 F (36.7 C)-99.1 F (37.3 C)] 98 F (36.7 C) (09/20 0508) Pulse Rate:  [55-75] 57 (09/20 0508) Resp:  [18-30] 20 (09/20 0508) BP: (135-186)/(72-141) 135/85 (09/20 0508) SpO2:  [94 %-99 %] 99 % (09/20 0508) Weight change:  Last BM Date: 06/16/17  Intake/Output from previous day: 09/19 0701 - 09/20 0700 In: 380 [P.O.:280; IV Piggyback:100] Out: 2350 [Urine:2350]  PHYSICAL EXAM General appearance: alert, cooperative and no distress Resp: rhonchi bilaterally Cardio: regular rate and rhythm, S1, S2 normal, no murmur, click, rub or gallop GI: soft, non-tender; bowel sounds normal; no masses,  no organomegaly Extremities: extremities normal, atraumatic, no cyanosis or edema Skin warm and dry.  Lab Results:  Results for orders placed or performed during the hospital encounter of 06/14/17 (from the past 48 hour(s))  CBC WITH DIFFERENTIAL     Status: Abnormal   Collection Time: 06/17/17  4:45 AM  Result Value Ref Range   WBC 11.3 (H) 4.0 - 10.5 K/uL   RBC 4.01 (L) 4.22 - 5.81 MIL/uL   Hemoglobin 12.3 (L) 13.0 - 17.0 g/dL   HCT 37.0 (L) 39.0 - 52.0 %   MCV 92.3 78.0 - 100.0 fL   MCH 30.7 26.0 - 34.0 pg   MCHC 33.2 30.0 - 36.0 g/dL   RDW 13.8 11.5 - 15.5 %   Platelets 127 (L) 150 - 400 K/uL   Neutrophils Relative % 77 %   Neutro Abs 8.6 (H) 1.7 - 7.7 K/uL   Lymphocytes Relative 16 %   Lymphs Abs 1.8 0.7 - 4.0 K/uL   Monocytes Relative 4 %   Monocytes Absolute 0.5 0.1 - 1.0 K/uL   Eosinophils Relative 3 %   Eosinophils Absolute 0.3 0.0 - 0.7 K/uL   Basophils Relative 0 %   Basophils Absolute 0.0 0.0 - 0.1 K/uL  Basic  metabolic panel     Status: Abnormal   Collection Time: 06/17/17  4:45 AM  Result Value Ref Range   Sodium 137 135 - 145 mmol/L   Potassium 3.3 (L) 3.5 - 5.1 mmol/L   Chloride 108 101 - 111 mmol/L   CO2 20 (L) 22 - 32 mmol/L   Glucose, Bld 103 (H) 65 - 99 mg/dL   BUN 17 6 - 20 mg/dL   Creatinine, Ser 1.70 (H) 0.61 - 1.24 mg/dL   Calcium 8.4 (L) 8.9 - 10.3 mg/dL   GFR calc non Af Amer 38 (L) >60 mL/min   GFR calc Af Amer 44 (L) >60 mL/min    Comment: (NOTE) The eGFR has been calculated using the CKD EPI equation. This calculation has not been validated in all clinical situations. eGFR's persistently <60 mL/min signify possible Chronic Kidney Disease.    Anion gap 9 5 - 15  Basic metabolic panel     Status: Abnormal   Collection Time: 06/18/17  6:03 AM  Result Value Ref Range   Sodium 135 135 - 145 mmol/L   Potassium 4.2 3.5 - 5.1 mmol/L    Comment: DELTA CHECK NOTED   Chloride 105 101 - 111 mmol/L   CO2 23 22 - 32  mmol/L   Glucose, Bld 117 (H) 65 - 99 mg/dL   BUN 24 (H) 6 - 20 mg/dL   Creatinine, Ser 1.63 (H) 0.61 - 1.24 mg/dL   Calcium 8.9 8.9 - 10.3 mg/dL   GFR calc non Af Amer 40 (L) >60 mL/min   GFR calc Af Amer 46 (L) >60 mL/min    Comment: (NOTE) The eGFR has been calculated using the CKD EPI equation. This calculation has not been validated in all clinical situations. eGFR's persistently <60 mL/min signify possible Chronic Kidney Disease.    Anion gap 7 5 - 15  CBC with Differential/Platelet     Status: Abnormal   Collection Time: 06/18/17  6:03 AM  Result Value Ref Range   WBC 7.9 4.0 - 10.5 K/uL   RBC 4.12 (L) 4.22 - 5.81 MIL/uL   Hemoglobin 12.9 (L) 13.0 - 17.0 g/dL   HCT 38.2 (L) 39.0 - 52.0 %   MCV 92.7 78.0 - 100.0 fL   MCH 31.3 26.0 - 34.0 pg   MCHC 33.8 30.0 - 36.0 g/dL   RDW 13.6 11.5 - 15.5 %   Platelets 161 150 - 400 K/uL   Neutrophils Relative % 77 %   Neutro Abs 6.1 1.7 - 7.7 K/uL   Lymphocytes Relative 17 %   Lymphs Abs 1.3 0.7 - 4.0 K/uL    Monocytes Relative 6 %   Monocytes Absolute 0.5 0.1 - 1.0 K/uL   Eosinophils Relative 0 %   Eosinophils Absolute 0.0 0.0 - 0.7 K/uL   Basophils Relative 0 %   Basophils Absolute 0.0 0.0 - 0.1 K/uL    ABGS No results for input(s): PHART, PO2ART, TCO2, HCO3 in the last 72 hours.  Invalid input(s): PCO2 CULTURES Recent Results (from the past 240 hour(s))  Urine culture     Status: Abnormal   Collection Time: 06/14/17  1:47 PM  Result Value Ref Range Status   Specimen Description URINE, CLEAN CATCH  Final   Special Requests NONE  Final   Culture 20,000 COLONIES/mL YEAST (A)  Final   Report Status 06/16/2017 FINAL  Final  Culture, blood (routine x 2)     Status: None (Preliminary result)   Collection Time: 06/14/17  2:31 PM  Result Value Ref Range Status   Specimen Description BLOOD RIGHT HAND  Final   Special Requests   Final    BOTTLES DRAWN AEROBIC AND ANAEROBIC Blood Culture results may not be optimal due to an inadequate volume of blood received in culture bottles   Culture NO GROWTH 4 DAYS  Final   Report Status PENDING  Incomplete  Culture, blood (routine x 2)     Status: None (Preliminary result)   Collection Time: 06/14/17  2:34 PM  Result Value Ref Range Status   Specimen Description BLOOD LEFT HAND  Final   Special Requests   Final    BOTTLES DRAWN AEROBIC AND ANAEROBIC Blood Culture adequate volume   Culture NO GROWTH 4 DAYS  Final   Report Status PENDING  Incomplete  MRSA PCR Screening     Status: None   Collection Time: 06/14/17  7:00 PM  Result Value Ref Range Status   MRSA by PCR NEGATIVE NEGATIVE Final    Comment:        The GeneXpert MRSA Assay (FDA approved for NASAL specimens only), is one component of a comprehensive MRSA colonization surveillance program. It is not intended to diagnose MRSA infection nor to guide or monitor treatment for MRSA infections.  C difficile quick scan w PCR reflex     Status: None   Collection Time: 06/15/17  6:27 PM   Result Value Ref Range Status   C Diff antigen NEGATIVE NEGATIVE Final   C Diff toxin NEGATIVE NEGATIVE Final   C Diff interpretation No C. difficile detected.  Final   Studies/Results: No results found.  Medications:  Prior to Admission:  Prescriptions Prior to Admission  Medication Sig Dispense Refill Last Dose  . carvedilol (COREG) 6.25 MG tablet Take 1 tablet (6.25 mg total) by mouth 2 (two) times daily with a meal.   06/14/2017 at 900a  . diazepam (VALIUM) 5 MG tablet TAKE 1 TABLET BY MOUTH TWICE A DAY 60 tablet 4 06/14/2017 at Unknown time  . HYDROcodone-acetaminophen (NORCO) 7.5-325 MG per tablet Take 1 tablet by mouth 2 (two) times daily. Max APAP 3 GM IN 24 HOURS FROM ALL SOURCES (Patient taking differently: Take 1 tablet by mouth 2 (two) times daily. May take up to 4 times daily as needed for pain) 60 tablet 0 06/14/2017 at Unknown time  . ibuprofen (ADVIL,MOTRIN) 200 MG tablet Take 200 mg by mouth every 6 (six) hours as needed for fever.   unknown  . levothyroxine (SYNTHROID, LEVOTHROID) 88 MCG tablet Take 88 mcg by mouth daily before breakfast.   06/14/2017 at Unknown time  . potassium chloride SA (K-DUR,KLOR-CON) 20 MEQ tablet Take 20 mEq by mouth daily.   06/14/2017 at Unknown time  . solifenacin (VESICARE) 10 MG tablet Take 10 mg by mouth daily as needed (for spasms).    unknown  . sulfamethoxazole-trimethoprim (BACTRIM DS,SEPTRA DS) 800-160 MG tablet Take 1 tablet by mouth 2 (two) times daily. 10 tablet 0 06/14/2017 at Unknown time  . verapamil (CALAN) 40 MG tablet Take 40 mg by mouth 2 (two) times daily.   06/14/2017 at Unknown time  . levofloxacin (LEVAQUIN) 500 MG tablet Take 500 mg by mouth daily.    06/08/2017 at Unknown time   Scheduled: . darifenacin  15 mg Oral Daily  . diazepam  5 mg Oral BID  . heparin  5,000 Units Subcutaneous Q8H  . Influenza vac split quadrivalent PF  0.5 mL Intramuscular Tomorrow-1000  . levothyroxine  88 mcg Oral QAC breakfast  . predniSONE  40  mg Oral Q breakfast   Continuous: . fluconazole (DIFLUCAN) IV Stopped (06/17/17 1042)  . piperacillin-tazobactam (ZOSYN)  IV 3.375 g (06/18/17 0620)   PJS:RPRXYVOPFYTWK, fentaNYL (SUBLIMAZE) injection, hydrALAZINE, HYDROcodone-acetaminophen, loperamide, ondansetron (ZOFRAN) IV  Assesment: He was admitted with sepsis secondary to urinary tract infection or pneumonia. He has been treated and improved. He had 20,000 colonies of yeast in his urine and has a chronic suprapubic catheter so that needs to be treated. His phosphate was low on admission. He had acute on chronic renal failure which is improved and he is back at baseline. At baseline he has some COPD and has multiple sclerosis so he's weak. He has a diagnosis listed of chronic systolic heart failure but his ejection fraction was normal although he has left ventricular hypertrophy and it looks like he has diastolic heart failure instead Principal Problem:   Sepsis secondary to UTI [facility]) Active Problems:   PNA (pneumonia)   Multiple sclerosis (HCC)   Hypothyroidism   Acute renal failure superimposed on stage 3 chronic kidney disease (HCC)   Chronic systolic congestive heart failure (HCC)   Essential hypertension   Paroxysmal atrial tachycardia (HCC)   Hypophosphatemia    Plan: I think he  is okay to go home today he does not want home health services    LOS: 4 days   Alexey Rhoads L 06/18/2017, 7:48 AM

## 2017-06-18 NOTE — Progress Notes (Signed)
Physical Therapy Treatment Patient Details Name: Jordan Ward MRN: [id number] DOB: 1941/09/07 Today's Date: 06/18/2017    History of Present Illness Jordan Ward is a 76 y.o. male with medical history significant of anemia, arthritis, chronic back pain, bilateral carotid bruits, history of C. difficile colitis, CAD, cerebrovascular disease, colon polyps, hyperlipidemia, poststroke dysphagia, gait disorder, history of headaches, history of high-grade dysplasia colonic adenoma, hypertension, hypothyroidism, multiple sclerosis, urolithiasis, pulmonary fibrosis, pulmonary nodules, sleep apnea, TIA, OSA, paroxysmal atrial tachycardia, pulmonary fibrosis, sleep, sacral ulcer, PVD, urolithiasis S/P suprapubic catheter, left ureteral stent placement 5 days ago who is brought to the emergency department due nausea, multiple episodes of emesis and rigors since this morning.    PT Comments    Patient tolerated sitting up in chair after gait training without c/o pain.  Patient will benefit from continued physical therapy in hospital and recommended venue below to increase strength, balance, endurance for safe ADLs and gait.    Follow Up Recommendations  Home health PT;Supervision/Assistance - 24 hour     Equipment Recommendations  None recommended by PT    Recommendations for Other Services       Precautions / Restrictions Precautions Precautions: Fall Restrictions Weight Bearing Restrictions: No    Mobility  Bed Mobility Overal bed mobility: Independent                Transfers Overall transfer level: Needs assistance Equipment used: Rolling walker (2 wheeled) Transfers: Sit to/from [employer] Transfers Sit to Stand: Supervision Stand pivot transfers: Supervision          Ambulation/Gait Ambulation/Gait assistance: Supervision Ambulation Distance (Feet): 130 Feet Assistive device: Rolling walker (2 wheeled) Gait Pattern/deviations: Decreased step length -  right;Decreased step length - left;Decreased stride length   Gait velocity interpretation: Below normal speed for age/gender General Gait Details: Demonstrates increased speed of cadence without loss of balance   Stairs            Wheelchair Mobility    Modified Rankin (Stroke Patients Only)       Balance Overall balance assessment: Needs assistance Sitting-balance support: No upper extremity supported;Feet supported Sitting balance-Leahy Scale: Good     Standing balance support: Bilateral upper extremity supported;During functional activity Standing balance-Leahy Scale: Fair                              Cognition Arousal/Alertness: Awake/alert Behavior During Therapy: WFL for tasks assessed/performed Overall Cognitive Status: Within Functional Limits for tasks assessed                                        Exercises General Exercises - Lower Extremity Ankle Circles/Pumps: Seated;AROM;Both;10 reps Long Arc Quad: Seated;AROM;Both;10 reps Hip Flexion/Marching: Seated;AROM;Both;10 reps (piriformis stretching to right hip x 5, 20 second holds)    General Comments        Pertinent Vitals/Pain Pain Assessment: No/denies pain (states piriformis stretching yesterday relieved sciatic pain and radiation)    Home Living                      Prior Function            PT Goals (current goals can now be found in the care plan section) Acute Rehab PT Goals Patient Stated Goal: return home at [facility] with family to assist PT Goal Formulation:  With patient Time For Goal Achievement: July 07, 2017 Potential to Achieve Goals: Good Progress towards PT goals: Progressing toward goals    Frequency    Min 3X/week      PT Plan      Co-evaluation              AM-PAC PT "6 Clicks" Daily Activity  Outcome Measure  Difficulty turning over in bed (including adjusting bedclothes, sheets and blankets)?: None Difficulty moving from  lying on back to sitting on the side of the bed? : None Difficulty sitting down on and standing up from a chair with arms (e.g., wheelchair, bedside commode, etc,.)?: None Help needed moving to and from a bed to chair (including a wheelchair)?: A Little Help needed walking in hospital room?: A Little Help needed climbing 3-5 steps with a railing? : A Little 6 Click Score: 21    End of Session   Activity Tolerance: Patient tolerated treatment well Patient left: in chair;with call bell/phone within reach Nurse Communication: Mobility status PT Visit Diagnosis: Unsteadiness on feet (R26.81);Other abnormalities of gait and mobility (R26.89);Muscle weakness (generalized) (M62.81)     Time: 8727-6184 PT Time Calculation (min) (ACUTE ONLY): 31 min  Charges:  $Therapeutic Activity: 23-37 mins                    G Codes:       1:16 PM, July 07, 2017 Lonell Grandchild, MPT Physical Therapist with [facility] 336 [phone] office [phone] mobile phone

## 2017-06-18 NOTE — Progress Notes (Signed)
Discharge instructions read to pt by Tawnya Crook RN. Pt moved to discharge lounge until ride arrives.

## 2017-06-18 NOTE — Care Management Important Message (Signed)
Important Message  Patient Details  Name: Jordan Ward MRN: [id number] Date of Birth: [birth year]   Medicare Important Message Given:  Yes    Sherald Barge, RN 06/18/2017, 8:11 AM

## 2017-06-18 NOTE — Care Management Note (Signed)
Case Management Note  Patient Details  Name: Jordan Ward MRN: [id number] Date of Birth: 08/11/1941  Expected Discharge Date:  06/18/17               Expected Discharge Plan:  Home/Self Care  Discharge planning Services  CM Consult  Status of Service:  Completed, signed off  Additional Comments: Discharging home today with self care. PT recommends HH PT. Pt declines services. No needs or concerns about DC plan communicated. Wife will provide transportation home.   Sherald Barge, RN 06/18/2017, 8:12 AM

## 2017-06-19 LAB — CULTURE, BLOOD (ROUTINE X 2)
CULTURE: NO GROWTH
Culture: NO GROWTH
SPECIAL REQUESTS: ADEQUATE

## 2017-06-30 ENCOUNTER — Other Ambulatory Visit (HOSPITAL_COMMUNITY)
Admission: RE | Admit: 2017-06-30 | Discharge: 2017-06-30 | Disposition: A | Discharge status: Home / Self Care | Payer: Medicare Other | Source: Other Acute Inpatient Hospital | Attending: Urology | Admitting: Urology

## 2017-06-30 ENCOUNTER — Ambulatory Visit (INDEPENDENT_AMBULATORY_CARE_PROVIDER_SITE_OTHER): Payer: Medicare Other | Admitting: Urology

## 2017-06-30 DIAGNOSIS — N312 Flaccid neuropathic bladder, not elsewhere classified: Secondary | ICD-10-CM

## 2017-06-30 DIAGNOSIS — N3000 Acute cystitis without hematuria: Secondary | ICD-10-CM | POA: Diagnosis not present

## 2017-06-30 DIAGNOSIS — N201 Calculus of ureter: Secondary | ICD-10-CM

## 2017-07-01 LAB — URINE CULTURE: Culture: NO GROWTH

## 2017-07-10 ENCOUNTER — Encounter (HOSPITAL_COMMUNITY): Payer: Self-pay | Admitting: Emergency Medicine

## 2017-07-10 ENCOUNTER — Inpatient Hospital Stay (HOSPITAL_COMMUNITY)
Admission: EM | Admit: 2017-07-10 | Discharge: 2017-07-17 | DRG: 699 | Disposition: A | Discharge status: Home / Self Care | Payer: Medicare Other | Attending: Pulmonary Disease | Admitting: Pulmonary Disease

## 2017-07-10 DIAGNOSIS — G35 Multiple sclerosis: Secondary | ICD-10-CM | POA: Diagnosis not present

## 2017-07-10 DIAGNOSIS — N3090 Cystitis, unspecified without hematuria: Secondary | ICD-10-CM | POA: Diagnosis not present

## 2017-07-10 DIAGNOSIS — I160 Hypertensive urgency: Secondary | ICD-10-CM | POA: Diagnosis present

## 2017-07-10 DIAGNOSIS — Y846 Urinary catheterization as the cause of abnormal reaction of the patient, or of later complication, without mention of misadventure at the time of the procedure: Secondary | ICD-10-CM | POA: Diagnosis present

## 2017-07-10 DIAGNOSIS — I2510 Atherosclerotic heart disease of native coronary artery without angina pectoris: Secondary | ICD-10-CM | POA: Diagnosis present

## 2017-07-10 DIAGNOSIS — Z79899 Other long term (current) drug therapy: Secondary | ICD-10-CM

## 2017-07-10 DIAGNOSIS — N319 Neuromuscular dysfunction of bladder, unspecified: Secondary | ICD-10-CM | POA: Diagnosis present

## 2017-07-10 DIAGNOSIS — I10 Essential (primary) hypertension: Secondary | ICD-10-CM | POA: Diagnosis present

## 2017-07-10 DIAGNOSIS — Z0189 Encounter for other specified special examinations: Secondary | ICD-10-CM

## 2017-07-10 DIAGNOSIS — I739 Peripheral vascular disease, unspecified: Secondary | ICD-10-CM | POA: Diagnosis present

## 2017-07-10 DIAGNOSIS — J8410 Pulmonary fibrosis, unspecified: Secondary | ICD-10-CM | POA: Diagnosis present

## 2017-07-10 DIAGNOSIS — N390 Urinary tract infection, site not specified: Secondary | ICD-10-CM

## 2017-07-10 DIAGNOSIS — I129 Hypertensive chronic kidney disease with stage 1 through stage 4 chronic kidney disease, or unspecified chronic kidney disease: Secondary | ICD-10-CM | POA: Diagnosis present

## 2017-07-10 DIAGNOSIS — N183 Chronic kidney disease, stage 3 unspecified: Secondary | ICD-10-CM | POA: Diagnosis present

## 2017-07-10 DIAGNOSIS — G8929 Other chronic pain: Secondary | ICD-10-CM | POA: Diagnosis present

## 2017-07-10 DIAGNOSIS — N99511 Cystostomy infection: Principal | ICD-10-CM | POA: Diagnosis present

## 2017-07-10 DIAGNOSIS — K8050 Calculus of bile duct without cholangitis or cholecystitis without obstruction: Secondary | ICD-10-CM | POA: Diagnosis present

## 2017-07-10 DIAGNOSIS — Z87891 Personal history of nicotine dependence: Secondary | ICD-10-CM

## 2017-07-10 DIAGNOSIS — G4700 Insomnia, unspecified: Secondary | ICD-10-CM | POA: Diagnosis present

## 2017-07-10 DIAGNOSIS — R109 Unspecified abdominal pain: Secondary | ICD-10-CM | POA: Diagnosis not present

## 2017-07-10 DIAGNOSIS — J449 Chronic obstructive pulmonary disease, unspecified: Secondary | ICD-10-CM | POA: Diagnosis present

## 2017-07-10 DIAGNOSIS — B965 Pseudomonas (aeruginosa) (mallei) (pseudomallei) as the cause of diseases classified elsewhere: Secondary | ICD-10-CM | POA: Diagnosis present

## 2017-07-10 DIAGNOSIS — I471 Supraventricular tachycardia: Secondary | ICD-10-CM | POA: Diagnosis not present

## 2017-07-10 DIAGNOSIS — G35D Multiple sclerosis, unspecified: Secondary | ICD-10-CM | POA: Diagnosis present

## 2017-07-10 DIAGNOSIS — Z8673 Personal history of transient ischemic attack (TIA), and cerebral infarction without residual deficits: Secondary | ICD-10-CM

## 2017-07-10 DIAGNOSIS — N200 Calculus of kidney: Secondary | ICD-10-CM | POA: Diagnosis not present

## 2017-07-10 DIAGNOSIS — E785 Hyperlipidemia, unspecified: Secondary | ICD-10-CM | POA: Diagnosis present

## 2017-07-10 DIAGNOSIS — Z9359 Other cystostomy status: Secondary | ICD-10-CM

## 2017-07-10 DIAGNOSIS — K439 Ventral hernia without obstruction or gangrene: Secondary | ICD-10-CM | POA: Diagnosis present

## 2017-07-10 DIAGNOSIS — I4719 Other supraventricular tachycardia: Secondary | ICD-10-CM | POA: Diagnosis present

## 2017-07-10 DIAGNOSIS — K56600 Partial intestinal obstruction, unspecified as to cause: Secondary | ICD-10-CM | POA: Diagnosis not present

## 2017-07-10 DIAGNOSIS — G4733 Obstructive sleep apnea (adult) (pediatric): Secondary | ICD-10-CM | POA: Diagnosis present

## 2017-07-10 DIAGNOSIS — E039 Hypothyroidism, unspecified: Secondary | ICD-10-CM | POA: Diagnosis present

## 2017-07-10 LAB — CBC WITH DIFFERENTIAL/PLATELET
Basophils Absolute: 0 10*3/uL (ref 0.0–0.1)
Basophils Relative: 0 %
EOS ABS: 0.3 10*3/uL (ref 0.0–0.7)
EOS PCT: 3 %
HCT: 42.6 % (ref 39.0–52.0)
Hemoglobin: 14 g/dL (ref 13.0–17.0)
LYMPHS ABS: 1.8 10*3/uL (ref 0.7–4.0)
Lymphocytes Relative: 21 %
MCH: 31 pg (ref 26.0–34.0)
MCHC: 32.9 g/dL (ref 30.0–36.0)
MCV: 94.2 fL (ref 78.0–100.0)
MONO ABS: 0.5 10*3/uL (ref 0.1–1.0)
Monocytes Relative: 6 %
Neutro Abs: 5.8 10*3/uL (ref 1.7–7.7)
Neutrophils Relative %: 70 %
PLATELETS: 235 10*3/uL (ref 150–400)
RBC: 4.52 MIL/uL (ref 4.22–5.81)
RDW: 13.6 % (ref 11.5–15.5)
WBC: 8.3 10*3/uL (ref 4.0–10.5)

## 2017-07-10 LAB — COMPREHENSIVE METABOLIC PANEL
ALK PHOS: 148 U/L — AB (ref 38–126)
ALT: 61 U/L (ref 17–63)
ANION GAP: 11 (ref 5–15)
AST: 61 U/L — ABNORMAL HIGH (ref 15–41)
Albumin: 3.7 g/dL (ref 3.5–5.0)
BILIRUBIN TOTAL: 1.1 mg/dL (ref 0.3–1.2)
BUN: 19 mg/dL (ref 6–20)
CALCIUM: 9.5 mg/dL (ref 8.9–10.3)
CO2: 27 mmol/L (ref 22–32)
CREATININE: 1.69 mg/dL — AB (ref 0.61–1.24)
Chloride: 101 mmol/L (ref 101–111)
GFR, EST AFRICAN AMERICAN: 44 mL/min — AB (ref >60)
GFR, EST NON AFRICAN AMERICAN: 38 mL/min — AB (ref >60)
Glucose, Bld: 122 mg/dL — ABNORMAL HIGH (ref 65–99)
Potassium: 4.8 mmol/L (ref 3.5–5.1)
SODIUM: 139 mmol/L (ref 135–145)
TOTAL PROTEIN: 7.6 g/dL (ref 6.5–8.1)

## 2017-07-10 MED ORDER — ONDANSETRON 4 MG PO TBDP
4.0000 mg | ORAL_TABLET | Freq: Once | ORAL | Status: AC
  Administered 2017-07-10: 4 mg via ORAL
  Filled 2017-07-10: qty 1

## 2017-07-10 NOTE — ED Notes (Signed)
Bladder scan with 32 cc

## 2017-07-10 NOTE — ED Notes (Addendum)
Pt reports that he lives alone

## 2017-07-10 NOTE — ED Notes (Signed)
Pt reports that he has had only drops of urine in his foley bag today  Recently seen by his PCP  Reports N/V of "Green stuff"

## 2017-07-10 NOTE — ED Notes (Signed)
JI at bedside  

## 2017-07-10 NOTE — ED Triage Notes (Signed)
Patient complaining of difficulty urinating starting today. States he has urinary catheter in and is only "dripping" today. States he saw Dr Luan Pulling this morning and was placed on antibiotic for UTI.

## 2017-07-11 ENCOUNTER — Inpatient Hospital Stay (HOSPITAL_COMMUNITY): Payer: Medicare Other

## 2017-07-11 ENCOUNTER — Emergency Department (HOSPITAL_COMMUNITY): Payer: Medicare Other

## 2017-07-11 ENCOUNTER — Encounter (HOSPITAL_COMMUNITY): Payer: Self-pay | Admitting: Family Medicine

## 2017-07-11 DIAGNOSIS — E785 Hyperlipidemia, unspecified: Secondary | ICD-10-CM | POA: Diagnosis present

## 2017-07-11 DIAGNOSIS — N3090 Cystitis, unspecified without hematuria: Secondary | ICD-10-CM | POA: Diagnosis present

## 2017-07-11 DIAGNOSIS — Y846 Urinary catheterization as the cause of abnormal reaction of the patient, or of later complication, without mention of misadventure at the time of the procedure: Secondary | ICD-10-CM | POA: Diagnosis present

## 2017-07-11 DIAGNOSIS — I471 Supraventricular tachycardia: Secondary | ICD-10-CM

## 2017-07-11 DIAGNOSIS — J449 Chronic obstructive pulmonary disease, unspecified: Secondary | ICD-10-CM | POA: Diagnosis present

## 2017-07-11 DIAGNOSIS — G8929 Other chronic pain: Secondary | ICD-10-CM | POA: Diagnosis present

## 2017-07-11 DIAGNOSIS — Z452 Encounter for adjustment and management of vascular access device: Secondary | ICD-10-CM | POA: Diagnosis not present

## 2017-07-11 DIAGNOSIS — Z79899 Other long term (current) drug therapy: Secondary | ICD-10-CM | POA: Diagnosis not present

## 2017-07-11 DIAGNOSIS — Z9359 Other cystostomy status: Secondary | ICD-10-CM

## 2017-07-11 DIAGNOSIS — N390 Urinary tract infection, site not specified: Secondary | ICD-10-CM | POA: Insufficient documentation

## 2017-07-11 DIAGNOSIS — R109 Unspecified abdominal pain: Secondary | ICD-10-CM | POA: Diagnosis not present

## 2017-07-11 DIAGNOSIS — N99511 Cystostomy infection: Secondary | ICD-10-CM | POA: Diagnosis present

## 2017-07-11 DIAGNOSIS — Z4682 Encounter for fitting and adjustment of non-vascular catheter: Secondary | ICD-10-CM | POA: Diagnosis not present

## 2017-07-11 DIAGNOSIS — I10 Essential (primary) hypertension: Secondary | ICD-10-CM | POA: Diagnosis not present

## 2017-07-11 DIAGNOSIS — R918 Other nonspecific abnormal finding of lung field: Secondary | ICD-10-CM | POA: Diagnosis not present

## 2017-07-11 DIAGNOSIS — T83510A Infection and inflammatory reaction due to cystostomy catheter, initial encounter: Secondary | ICD-10-CM | POA: Diagnosis not present

## 2017-07-11 DIAGNOSIS — I739 Peripheral vascular disease, unspecified: Secondary | ICD-10-CM | POA: Diagnosis present

## 2017-07-11 DIAGNOSIS — R197 Diarrhea, unspecified: Secondary | ICD-10-CM | POA: Diagnosis not present

## 2017-07-11 DIAGNOSIS — N319 Neuromuscular dysfunction of bladder, unspecified: Secondary | ICD-10-CM | POA: Diagnosis present

## 2017-07-11 DIAGNOSIS — N183 Chronic kidney disease, stage 3 (moderate): Secondary | ICD-10-CM

## 2017-07-11 DIAGNOSIS — I160 Hypertensive urgency: Secondary | ICD-10-CM | POA: Diagnosis present

## 2017-07-11 DIAGNOSIS — K8050 Calculus of bile duct without cholangitis or cholecystitis without obstruction: Secondary | ICD-10-CM | POA: Diagnosis present

## 2017-07-11 DIAGNOSIS — K56600 Partial intestinal obstruction, unspecified as to cause: Secondary | ICD-10-CM | POA: Diagnosis present

## 2017-07-11 DIAGNOSIS — Z8673 Personal history of transient ischemic attack (TIA), and cerebral infarction without residual deficits: Secondary | ICD-10-CM | POA: Diagnosis not present

## 2017-07-11 DIAGNOSIS — I2510 Atherosclerotic heart disease of native coronary artery without angina pectoris: Secondary | ICD-10-CM | POA: Diagnosis present

## 2017-07-11 DIAGNOSIS — I129 Hypertensive chronic kidney disease with stage 1 through stage 4 chronic kidney disease, or unspecified chronic kidney disease: Secondary | ICD-10-CM | POA: Diagnosis present

## 2017-07-11 DIAGNOSIS — G35 Multiple sclerosis: Secondary | ICD-10-CM

## 2017-07-11 DIAGNOSIS — N200 Calculus of kidney: Secondary | ICD-10-CM | POA: Diagnosis not present

## 2017-07-11 DIAGNOSIS — E039 Hypothyroidism, unspecified: Secondary | ICD-10-CM

## 2017-07-11 DIAGNOSIS — K439 Ventral hernia without obstruction or gangrene: Secondary | ICD-10-CM | POA: Diagnosis present

## 2017-07-11 DIAGNOSIS — Z87891 Personal history of nicotine dependence: Secondary | ICD-10-CM | POA: Diagnosis not present

## 2017-07-11 DIAGNOSIS — K5669 Other partial intestinal obstruction: Secondary | ICD-10-CM | POA: Diagnosis not present

## 2017-07-11 DIAGNOSIS — G4733 Obstructive sleep apnea (adult) (pediatric): Secondary | ICD-10-CM | POA: Diagnosis present

## 2017-07-11 DIAGNOSIS — N3941 Urge incontinence: Secondary | ICD-10-CM | POA: Diagnosis not present

## 2017-07-11 DIAGNOSIS — J8410 Pulmonary fibrosis, unspecified: Secondary | ICD-10-CM | POA: Diagnosis present

## 2017-07-11 DIAGNOSIS — G4700 Insomnia, unspecified: Secondary | ICD-10-CM | POA: Diagnosis present

## 2017-07-11 LAB — CBC WITH DIFFERENTIAL/PLATELET
BASOS ABS: 0 10*3/uL (ref 0.0–0.1)
BASOS PCT: 0 %
Eosinophils Absolute: 0.2 10*3/uL (ref 0.0–0.7)
Eosinophils Relative: 3 %
HEMATOCRIT: 42.5 % (ref 39.0–52.0)
HEMOGLOBIN: 13.7 g/dL (ref 13.0–17.0)
LYMPHS PCT: 23 %
Lymphs Abs: 1.7 10*3/uL (ref 0.7–4.0)
MCH: 30.6 pg (ref 26.0–34.0)
MCHC: 32.2 g/dL (ref 30.0–36.0)
MCV: 94.9 fL (ref 78.0–100.0)
MONOS PCT: 5 %
Monocytes Absolute: 0.4 10*3/uL (ref 0.1–1.0)
NEUTROS ABS: 5.1 10*3/uL (ref 1.7–7.7)
NEUTROS PCT: 69 %
Platelets: 235 10*3/uL (ref 150–400)
RBC: 4.48 MIL/uL (ref 4.22–5.81)
RDW: 13.5 % (ref 11.5–15.5)
WBC: 7.3 10*3/uL (ref 4.0–10.5)

## 2017-07-11 LAB — URINALYSIS, ROUTINE W REFLEX MICROSCOPIC
BACTERIA UA: NONE SEEN
BILIRUBIN URINE: NEGATIVE
GLUCOSE, UA: NEGATIVE mg/dL
KETONES UR: 5 mg/dL — AB
Nitrite: POSITIVE — AB
PH: 6 (ref 5.0–8.0)
PROTEIN: 30 mg/dL — AB
Specific Gravity, Urine: 1.013 (ref 1.005–1.030)

## 2017-07-11 LAB — COMPREHENSIVE METABOLIC PANEL
ALT: 44 U/L (ref 17–63)
ANION GAP: 10 (ref 5–15)
AST: 34 U/L (ref 15–41)
Albumin: 3.3 g/dL — ABNORMAL LOW (ref 3.5–5.0)
Alkaline Phosphatase: 132 U/L — ABNORMAL HIGH (ref 38–126)
BILIRUBIN TOTAL: 0.7 mg/dL (ref 0.3–1.2)
BUN: 15 mg/dL (ref 6–20)
CALCIUM: 8.3 mg/dL — AB (ref 8.9–10.3)
CO2: 26 mmol/L (ref 22–32)
Chloride: 99 mmol/L — ABNORMAL LOW (ref 101–111)
Creatinine, Ser: 1.41 mg/dL — ABNORMAL HIGH (ref 0.61–1.24)
GFR calc non Af Amer: 48 mL/min — ABNORMAL LOW (ref >60)
GFR, EST AFRICAN AMERICAN: 55 mL/min — AB (ref >60)
Glucose, Bld: 122 mg/dL — ABNORMAL HIGH (ref 65–99)
POTASSIUM: 4.5 mmol/L (ref 3.5–5.1)
Sodium: 135 mmol/L (ref 135–145)
Total Protein: 6.8 g/dL (ref 6.5–8.1)

## 2017-07-11 LAB — I-STAT CG4 LACTIC ACID, ED: Lactic Acid, Venous: 1.52 mmol/L (ref 0.5–1.9)

## 2017-07-11 LAB — LIPASE, BLOOD: Lipase: 20 U/L (ref 11–51)

## 2017-07-11 MED ORDER — CARVEDILOL 3.125 MG PO TABS: 6.2500 mg | ORAL_TABLET | Freq: Two times a day (BID) | ORAL | Status: DC

## 2017-07-11 MED ORDER — ACETAMINOPHEN 325 MG PO TABS: 650.0000 mg | ORAL_TABLET | Freq: Four times a day (QID) | ORAL | Status: DC | PRN

## 2017-07-11 MED ORDER — ONDANSETRON HCL 4 MG/2ML IJ SOLN
4.0000 mg | Freq: Four times a day (QID) | INTRAMUSCULAR | Status: DC | PRN
  Administered 2017-07-11: 4 mg via INTRAVENOUS
  Filled 2017-07-11: qty 2

## 2017-07-11 MED ORDER — VERAPAMIL HCL 80 MG PO TABS
40.0000 mg | ORAL_TABLET | Freq: Two times a day (BID) | ORAL | Status: DC
  Filled 2017-07-11 (×6): qty 1

## 2017-07-11 MED ORDER — LEVOTHYROXINE SODIUM 100 MCG IV SOLR
50.0000 ug | Freq: Every day | INTRAVENOUS | Status: DC
  Administered 2017-07-11 – 2017-07-12 (×2): 50 ug via INTRAVENOUS
  Filled 2017-07-11 (×3): qty 5

## 2017-07-11 MED ORDER — SODIUM CHLORIDE 0.9 % IV SOLN
INTRAVENOUS | Status: AC
  Administered 2017-07-11: 05:00:00 via INTRAVENOUS

## 2017-07-11 MED ORDER — LEVOTHYROXINE SODIUM 88 MCG PO TABS
88.0000 ug | ORAL_TABLET | Freq: Every day | ORAL | Status: DC
  Filled 2017-07-11 (×3): qty 1

## 2017-07-11 MED ORDER — SODIUM CHLORIDE 0.9 % IV SOLN
1.0000 g | Freq: Once | INTRAVENOUS | Status: AC
  Administered 2017-07-11: 1 g via INTRAVENOUS
  Filled 2017-07-11: qty 1

## 2017-07-11 MED ORDER — ONDANSETRON HCL 4 MG PO TABS: 8.0000 mg | ORAL_TABLET | Freq: Four times a day (QID) | ORAL | Status: DC | PRN

## 2017-07-11 MED ORDER — LORAZEPAM 2 MG/ML IJ SOLN: 0.5000 mg | Freq: Four times a day (QID) | INTRAMUSCULAR | Status: DC | PRN

## 2017-07-11 MED ORDER — DEXTROSE 5 % IV SOLN
1.0000 g | Freq: Once | INTRAVENOUS | Status: AC
  Administered 2017-07-11: 1 g via INTRAVENOUS
  Filled 2017-07-11: qty 10

## 2017-07-11 MED ORDER — HYDROCODONE-ACETAMINOPHEN 7.5-325 MG PO TABS: 1.0000 | ORAL_TABLET | Freq: Four times a day (QID) | ORAL | Status: DC | PRN

## 2017-07-11 MED ORDER — HEPARIN SODIUM (PORCINE) 5000 UNIT/ML IJ SOLN
5000.0000 [IU] | Freq: Three times a day (TID) | INTRAMUSCULAR | Status: DC
  Administered 2017-07-11 – 2017-07-16 (×18): 5000 [IU] via SUBCUTANEOUS
  Filled 2017-07-11 (×17): qty 1

## 2017-07-11 MED ORDER — SODIUM CHLORIDE 0.9 % IV SOLN
1.0000 g | Freq: Two times a day (BID) | INTRAVENOUS | Status: DC
  Administered 2017-07-11 – 2017-07-14 (×6): 1 g via INTRAVENOUS
  Filled 2017-07-11 (×9): qty 1

## 2017-07-11 MED ORDER — SODIUM CHLORIDE 0.9 % IV SOLN
8.0000 mg | Freq: Four times a day (QID) | INTRAVENOUS | Status: DC | PRN
  Filled 2017-07-11: qty 4

## 2017-07-11 MED ORDER — FENTANYL CITRATE (PF) 100 MCG/2ML IJ SOLN
50.0000 ug | INTRAMUSCULAR | Status: DC | PRN
  Administered 2017-07-11 – 2017-07-13 (×8): 50 ug via INTRAVENOUS
  Filled 2017-07-11 (×8): qty 2

## 2017-07-11 MED ORDER — SODIUM CHLORIDE 0.9 % IV BOLUS (SEPSIS)
1000.0000 mL | Freq: Once | INTRAVENOUS | Status: AC
  Administered 2017-07-11: 1000 mL via INTRAVENOUS

## 2017-07-11 MED ORDER — METOPROLOL TARTRATE 5 MG/5ML IV SOLN
2.5000 mg | Freq: Two times a day (BID) | INTRAVENOUS | Status: DC
  Administered 2017-07-11 – 2017-07-12 (×4): 2.5 mg via INTRAVENOUS
  Filled 2017-07-11 (×4): qty 5

## 2017-07-11 MED ORDER — ACETAMINOPHEN 650 MG RE SUPP
650.0000 mg | Freq: Four times a day (QID) | RECTAL | Status: DC | PRN
  Administered 2017-07-11: 650 mg via RECTAL
  Filled 2017-07-11: qty 1

## 2017-07-11 MED ORDER — HYDRALAZINE HCL 20 MG/ML IJ SOLN: 10.0000 mg | INTRAMUSCULAR | Status: DC | PRN

## 2017-07-11 MED ORDER — DIAZEPAM 5 MG PO TABS: 5.0000 mg | ORAL_TABLET | Freq: Two times a day (BID) | ORAL | Status: DC

## 2017-07-11 MED ORDER — ONDANSETRON HCL 4 MG PO TABS: 4.0000 mg | ORAL_TABLET | Freq: Four times a day (QID) | ORAL | Status: DC | PRN

## 2017-07-11 NOTE — Progress Notes (Signed)
Subjective: He was admitted early this morning with UTI and what looks like small bowel obstruction. He had more vomiting in the last hour. We discussed nasogastric tube but he doesn't want to do that now. I suspect he has adhesions from his multiple surgeries.  Objective: Vital signs in last 24 hours: Temp:  [98 F (36.7 C)-100.4 F (38 C)] 98.4 F (36.9 C) (10/13 0357) Pulse Rate:  [66-77] 77 (10/13 0357) Resp:  [18-22] 18 (10/13 0357) BP: (129-209)/(77-95) 209/80 (10/13 0357) SpO2:  [87 %-98 %] 98 % (10/13 0357) Weight:  [60.5 kg (133 lb 4.8 oz)-61.2 kg (135 lb)] 60.5 kg (133 lb 4.8 oz) (10/13 0357) Weight change:  Last BM Date: 07/10/17  Intake/Output from previous day: 10/12 0701 - 10/13 0700 In: 1400 [I.V.:300; IV Piggyback:1100] Out: -   PHYSICAL EXAM General appearance: alert, cooperative and mild distress Resp: rhonchi bilaterally Cardio: regular rate and rhythm, S1, S2 normal, no murmur, click, rub or gallop GI: Distended minimally tender Extremities: extremities normal, atraumatic, no cyanosis or edema Skin warm and dry  Lab Results:  Results for orders placed or performed during the hospital encounter of 07/10/17 (from the past 48 hour(s))  CBC with Differential     Status: None   Collection Time: 07/10/17 10:19 PM  Result Value Ref Range   WBC 8.3 4.0 - 10.5 K/uL   RBC 4.52 4.22 - 5.81 MIL/uL   Hemoglobin 14.0 13.0 - 17.0 g/dL   HCT 42.6 39.0 - 52.0 %   MCV 94.2 78.0 - 100.0 fL   MCH 31.0 26.0 - 34.0 pg   MCHC 32.9 30.0 - 36.0 g/dL   RDW 13.6 11.5 - 15.5 %   Platelets 235 150 - 400 K/uL   Neutrophils Relative % 70 %   Neutro Abs 5.8 1.7 - 7.7 K/uL   Lymphocytes Relative 21 %   Lymphs Abs 1.8 0.7 - 4.0 K/uL   Monocytes Relative 6 %   Monocytes Absolute 0.5 0.1 - 1.0 K/uL   Eosinophils Relative 3 %   Eosinophils Absolute 0.3 0.0 - 0.7 K/uL   Basophils Relative 0 %   Basophils Absolute 0.0 0.0 - 0.1 K/uL  Comprehensive metabolic panel     Status:  Abnormal   Collection Time: 07/10/17 10:19 PM  Result Value Ref Range   Sodium 139 135 - 145 mmol/L   Potassium 4.8 3.5 - 5.1 mmol/L   Chloride 101 101 - 111 mmol/L   CO2 27 22 - 32 mmol/L   Glucose, Bld 122 (H) 65 - 99 mg/dL   BUN 19 6 - 20 mg/dL   Creatinine, Ser 1.69 (H) 0.61 - 1.24 mg/dL   Calcium 9.5 8.9 - 10.3 mg/dL   Total Protein 7.6 6.5 - 8.1 g/dL   Albumin 3.7 3.5 - 5.0 g/dL   AST 61 (H) 15 - 41 U/L   ALT 61 17 - 63 U/L   Alkaline Phosphatase 148 (H) 38 - 126 U/L   Total Bilirubin 1.1 0.3 - 1.2 mg/dL   GFR calc non Af Amer 38 (L) >60 mL/min   GFR calc Af Amer 44 (L) >60 mL/min    Comment: (NOTE) The eGFR has been calculated using the CKD EPI equation. This calculation has not been validated in all clinical situations. eGFR's persistently <60 mL/min signify possible Chronic Kidney Disease.    Anion gap 11 5 - 15  Blood culture (routine x 2)     Status: None (Preliminary result)   Collection Time: 07/11/17  12:30 AM  Result Value Ref Range   Specimen Description RIGHT ANTECUBITAL    Special Requests      BOTTLES DRAWN AEROBIC AND ANAEROBIC Blood Culture adequate volume   Culture PENDING    Report Status PENDING   Urinalysis, Routine w reflex microscopic     Status: Abnormal   Collection Time: 07/11/17 12:45 AM  Result Value Ref Range   Color, Urine YELLOW YELLOW   APPearance CLEAR CLEAR   Specific Gravity, Urine 1.013 1.005 - 1.030   pH 6.0 5.0 - 8.0   Glucose, UA NEGATIVE NEGATIVE mg/dL   Hgb urine dipstick SMALL (A) NEGATIVE   Bilirubin Urine NEGATIVE NEGATIVE   Ketones, ur 5 (A) NEGATIVE mg/dL   Protein, ur 30 (A) NEGATIVE mg/dL   Nitrite POSITIVE (A) NEGATIVE   Leukocytes, UA MODERATE (A) NEGATIVE   RBC / HPF 0-5 0 - 5 RBC/hpf   WBC, UA 6-30 0 - 5 WBC/hpf   Bacteria, UA NONE SEEN NONE SEEN   Squamous Epithelial / LPF 0-5 (A) NONE SEEN   Mucus PRESENT   Blood culture (routine x 2)     Status: None (Preliminary result)   Collection Time: 07/11/17 12:45  AM  Result Value Ref Range   Specimen Description BLOOD RIGHT ARM    Special Requests      BOTTLES DRAWN AEROBIC ONLY Blood Culture adequate volume   Culture PENDING    Report Status PENDING   I-Stat CG4 Lactic Acid, ED     Status: None   Collection Time: 07/11/17 12:54 AM  Result Value Ref Range   Lactic Acid, Venous 1.52 0.5 - 1.9 mmol/L  Comprehensive metabolic panel     Status: Abnormal   Collection Time: 07/11/17  7:42 AM  Result Value Ref Range   Sodium 135 135 - 145 mmol/L   Potassium 4.5 3.5 - 5.1 mmol/L   Chloride 99 (L) 101 - 111 mmol/L   CO2 26 22 - 32 mmol/L   Glucose, Bld 122 (H) 65 - 99 mg/dL   BUN 15 6 - 20 mg/dL   Creatinine, Ser 1.41 (H) 0.61 - 1.24 mg/dL   Calcium 8.3 (L) 8.9 - 10.3 mg/dL   Total Protein 6.8 6.5 - 8.1 g/dL   Albumin 3.3 (L) 3.5 - 5.0 g/dL   AST 34 15 - 41 U/L   ALT 44 17 - 63 U/L   Alkaline Phosphatase 132 (H) 38 - 126 U/L   Total Bilirubin 0.7 0.3 - 1.2 mg/dL   GFR calc non Af Amer 48 (L) >60 mL/min   GFR calc Af Amer 55 (L) >60 mL/min    Comment: (NOTE) The eGFR has been calculated using the CKD EPI equation. This calculation has not been validated in all clinical situations. eGFR's persistently <60 mL/min signify possible Chronic Kidney Disease.    Anion gap 10 5 - 15  CBC WITH DIFFERENTIAL     Status: None   Collection Time: 07/11/17  7:42 AM  Result Value Ref Range   WBC 7.3 4.0 - 10.5 K/uL   RBC 4.48 4.22 - 5.81 MIL/uL   Hemoglobin 13.7 13.0 - 17.0 g/dL   HCT 42.5 39.0 - 52.0 %   MCV 94.9 78.0 - 100.0 fL   MCH 30.6 26.0 - 34.0 pg   MCHC 32.2 30.0 - 36.0 g/dL   RDW 13.5 11.5 - 15.5 %   Platelets 235 150 - 400 K/uL   Neutrophils Relative % 69 %   Neutro Abs 5.1  1.7 - 7.7 K/uL   Lymphocytes Relative 23 %   Lymphs Abs 1.7 0.7 - 4.0 K/uL   Monocytes Relative 5 %   Monocytes Absolute 0.4 0.1 - 1.0 K/uL   Eosinophils Relative 3 %   Eosinophils Absolute 0.2 0.0 - 0.7 K/uL   Basophils Relative 0 %   Basophils Absolute 0.0 0.0 -  0.1 K/uL  Lipase, blood     Status: None   Collection Time: 07/11/17  7:42 AM  Result Value Ref Range   Lipase 20 11 - 51 U/L    ABGS No results for input(s): PHART, PO2ART, TCO2, HCO3 in the last 72 hours.  Invalid input(s): PCO2 CULTURES Recent Results (from the past 240 hour(s))  Blood culture (routine x 2)     Status: None (Preliminary result)   Collection Time: 07/11/17 12:30 AM  Result Value Ref Range Status   Specimen Description RIGHT ANTECUBITAL  Final   Special Requests   Final    BOTTLES DRAWN AEROBIC AND ANAEROBIC Blood Culture adequate volume   Culture PENDING  Incomplete   Report Status PENDING  Incomplete  Blood culture (routine x 2)     Status: None (Preliminary result)   Collection Time: 07/11/17 12:45 AM  Result Value Ref Range Status   Specimen Description BLOOD RIGHT ARM  Final   Special Requests   Final    BOTTLES DRAWN AEROBIC ONLY Blood Culture adequate volume   Culture PENDING  Incomplete   Report Status PENDING  Incomplete   Studies/Results: Dg Chest 2 View  Result Date: 07/11/2017 CLINICAL DATA:  Dysuria today. Recent antibiotic treatment for urinary tract infection. Fever. EXAM: CHEST  2 VIEW COMPARISON:  06/14/2017 FINDINGS: Emphysematous disease and chronic appearing interstitial coarsening persist without significant interval change. No confluent airspace consolidation. No effusions. Normal heart size. Unremarkable hilar and mediastinal contours. Normal pulmonary vasculature. IMPRESSION: Chronic emphysematous and fibrotic changes. No airspace consolidation. No effusion. Electronically Signed   By: Andreas Newport M.D.   On: 07/11/2017 01:30   Ct Renal Stone Study  Result Date: 07/11/2017 CLINICAL DATA:  Acute onset of dysuria and difficulty urinating. Initial encounter. EXAM: CT ABDOMEN AND PELVIS WITHOUT CONTRAST TECHNIQUE: Multidetector CT imaging of the abdomen and pelvis was performed following the standard protocol without IV contrast.  COMPARISON:  CT of the abdomen and pelvis performed 06/14/2017 FINDINGS: Lower chest: Diffuse coronary artery calcifications are seen. Scarring and nodularity are noted at the right lung base. Hepatobiliary: The liver is unremarkable in appearance. The patient is status post cholecystectomy, with clips noted at the gallbladder fossa. There is dilatation of the common bile duct to 1.5 cm, with an apparent obstructing 1.4 cm stone at the level of the pancreatic head. Trace ascites is noted tracking about the liver. Pancreas: The pancreas is within normal limits. Spleen: The spleen is unremarkable in appearance. Adrenals/Urinary Tract: The adrenal glands are unremarkable in appearance. There is severe right renal atrophy. Minimal left-sided hydronephrosis is noted. There is diffuse Godley thickening at the left renal pelvis and proximal left ureter, raising question for left-sided ureteritis. Bilateral perinephric stranding is noted. Bilateral renal cysts are seen. A nonobstructing 6 mm stone is noted at the lower pole of the right kidney. Stomach/Bowel: The stomach is unremarkable in appearance. There is dilatation of small-bowel loops up to 4.9 cm in maximal diameter, with mild fecalization, just proximal to a relatively large anterior abdominal Emigh hernia. There is herniation of multiple small bowel loops into the hernia. Mild associated mesenteric  edema is noted. This is mildly worsened from September, and concerning for partial small bowel obstruction or significant dysmotility secondary to the hernia. Small-bowel loops within the pelvis demonstrate mild diffuse Kopper thickening, concerning for ileitis. This is new from September. A small amount of free fluid is noted within the pelvis. The patient is status post appendectomy. Postoperative change is noted at the proximal sigmoid colon. The remaining colon is unremarkable in appearance. Vascular/Lymphatic: Diffuse calcification is seen along the abdominal aorta and  its branches. There is marked narrowing of the distal abdominal aorta, raising concern for severe luminal narrowing or occlusion. There is associated diffuse narrowing of the common, internal and external iliac arteries. The inferior vena cava is grossly unremarkable. No retroperitoneal lymphadenopathy is seen. No pelvic sidewall lymphadenopathy is identified. Reproductive: The bladder is relatively decompressed. A suprapubic catheter is noted. Bladder Low thickening could reflect cystitis. The prostate is normal in size. Other: No additional soft tissue abnormalities are seen. Musculoskeletal: No acute osseous abnormalities are identified. Vacuum phenomenon is noted at L5-S1. The visualized musculature is unremarkable in appearance. IMPRESSION: 1. Bladder Bocanegra thickening could reflect cystitis. 2. Small-bowel loops within the pelvis demonstrate mild diffuse Sarli thickening, concerning for ileitis. This is new from September. 3. Dilatation of small-bowel loops up to 4.9 cm in maximal diameter, with mild fecalization, just proximal to a relatively large anterior abdominal Borrero hernia. Herniation of multiple small bowel loops into the hernia. Mild associated mesenteric edema. This is mildly worsened from September, and concerning for partial small bowel obstruction or significant dysmotility secondary to the hernia. 4. Diffuse [place] thickening at the left renal pelvis and proximal left ureter, raising concern for left-sided ureteritis. Minimal left-sided hydronephrosis noted. 5. Distention of the common bile duct to 1.5 cm. Apparent obstructing 1.4 cm stone at the level of the pancreatic head. This stone has progressed distally since September. 6. Trace ascites within the abdomen and pelvis. 7. Diffuse coronary artery calcifications. 8. Severe right renal atrophy. A 6 mm nonobstructing stone at the lower pole of the right kidney. 9. Diffuse aortic atherosclerosis. Marked narrowing of the distal abdominal aorta,  raising concern for severe luminal narrowing or occlusion. Associated diffuse narrowing of the common, internal and external iliac arteries. Electronically Signed   By: Garald Balding M.D.   On: 07/11/2017 01:53    Medications:  Prior to Admission:  Prescriptions Prior to Admission  Medication Sig Dispense Refill Last Dose  . carvedilol (COREG) 6.25 MG tablet Take 1 tablet (6.25 mg total) by mouth 2 (two) times daily with a meal.   06/14/2017 at 900a  . diazepam (VALIUM) 5 MG tablet TAKE 1 TABLET BY MOUTH TWICE A DAY 60 tablet 4 06/14/2017 at Unknown time  . HYDROcodone-acetaminophen (NORCO) 7.5-325 MG per tablet Take 1 tablet by mouth 2 (two) times daily. Max APAP 3 GM IN 24 HOURS FROM ALL SOURCES (Patient taking differently: Take 1 tablet by mouth 2 (two) times daily. May take up to 4 times daily as needed for pain) 60 tablet 0 06/14/2017 at Unknown time  . ibuprofen (ADVIL,MOTRIN) 200 MG tablet Take 200 mg by mouth every 6 (six) hours as needed for fever.   unknown  . levothyroxine (SYNTHROID, LEVOTHROID) 88 MCG tablet Take 88 mcg by mouth daily before breakfast.   06/14/2017 at Unknown time  . potassium chloride SA (K-DUR,KLOR-CON) 20 MEQ tablet Take 20 mEq by mouth daily.   06/14/2017 at Unknown time  . predniSONE (STERAPRED UNI-PAK 21 TAB) 10 MG (21)  TBPK tablet Take by package instructions 21 tablet 0   . solifenacin (VESICARE) 10 MG tablet Take 10 mg by mouth daily as needed (for spasms).    unknown  . verapamil (CALAN) 40 MG tablet Take 40 mg by mouth 2 (two) times daily.   06/14/2017 at Unknown time   Scheduled: . carvedilol  6.25 mg Oral BID WC  . diazepam  5 mg Oral BID  . heparin  5,000 Units Subcutaneous Q8H  . levothyroxine  88 mcg Oral QAC breakfast  . verapamil  40 mg Oral BID   Continuous: . sodium chloride 100 mL/hr at 07/11/17 0437  . meropenem (MERREM) IV     [id number] **OR** acetaminophen, fentaNYL (SUBLIMAZE) injection, hydrALAZINE, HYDROcodone-acetaminophen,  ondansetron **OR** ondansetron (ZOFRAN) IV  Assesment: He was admitted with urinary tract infection. He has what appears to be a small bowel obstruction. He has multiple sclerosis at baseline. He has hypertension at baseline. I don't think he's going to be older take any of his oral medications now. Principal Problem:   UTI (urinary tract infection) Active Problems:   Multiple sclerosis (HCC)   HTN (hypertension), malignant   Chronic suprapubic catheter (HCC)   Hypothyroidism   CKD (chronic kidney disease), stage III (HCC)   Paroxysmal atrial tachycardia (HCC)   Partial small bowel obstruction (HCC)   Choledocholithiasis    Plan: Change meds to IV.    LOS: 0 days   Tyia Binford L 07/11/2017, 9:49 AM

## 2017-07-11 NOTE — Progress Notes (Signed)
Patient states he is nauseous, patient vomited, dark green in color, given PRN zofran, MD notiftied.

## 2017-07-11 NOTE — Progress Notes (Addendum)
ANTIBIOTIC CONSULT NOTE-Preliminary  Pharmacy Consult for meropenem Indication: UTI  Allergies  Allergen Reactions  . Tetracyclines & Related Anaphylaxis and Rash  . Ciprofloxacin     Trouble swallowing unknown reaction according to wife     Patient Measurements: Height: 5\' 5"  (165.1 cm) Weight: 135 lb (61.2 kg) IBW/kg (Calculated) : 61.5 Adjusted Body Weight:   Vital Signs: Temp: 100.4 F (38 C) (10/13 0054) Temp Source: Rectal (10/13 0054) BP: 202/91 (10/13 0300) Pulse Rate: 73 (10/13 0300)  Labs:  Recent Labs  07/10/17 2219  WBC 8.3  HGB 14.0  PLT 235  CREATININE 1.69*    Estimated Creatinine Clearance: 33.2 mL/min (A) (by C-G formula based on SCr of 1.69 mg/dL (H)).  No results for input(s): VANCOTROUGH, VANCOPEAK, VANCORANDOM, GENTTROUGH, GENTPEAK, GENTRANDOM, TOBRATROUGH, TOBRAPEAK, TOBRARND, AMIKACINPEAK, AMIKACINTROU, AMIKACIN in the last 72 hours.   Microbiology: Recent Results (from the past 720 hour(s))  Urine culture     Status: Abnormal   Collection Time: 06/14/17  1:47 PM  Result Value Ref Range Status   Specimen Description URINE, CLEAN CATCH  Final   Special Requests NONE  Final   Culture 20,000 COLONIES/mL YEAST (A)  Final   Report Status 06/16/2017 FINAL  Final  Culture, blood (routine x 2)     Status: None   Collection Time: 06/14/17  2:31 PM  Result Value Ref Range Status   Specimen Description BLOOD RIGHT HAND  Final   Special Requests   Final    BOTTLES DRAWN AEROBIC AND ANAEROBIC Blood Culture results may not be optimal due to an inadequate volume of blood received in culture bottles   Culture NO GROWTH 5 DAYS  Final   Report Status 06/19/2017 FINAL  Final  Culture, blood (routine x 2)     Status: None   Collection Time: 06/14/17  2:34 PM  Result Value Ref Range Status   Specimen Description BLOOD LEFT HAND  Final   Special Requests   Final    BOTTLES DRAWN AEROBIC AND ANAEROBIC Blood Culture adequate volume   Culture NO GROWTH 5  DAYS  Final   Report Status 06/19/2017 FINAL  Final  MRSA PCR Screening     Status: None   Collection Time: 06/14/17  7:00 PM  Result Value Ref Range Status   MRSA by PCR NEGATIVE NEGATIVE Final    Comment:        The GeneXpert MRSA Assay (FDA approved for NASAL specimens only), is one component of a comprehensive MRSA colonization surveillance program. It is not intended to diagnose MRSA infection nor to guide or monitor treatment for MRSA infections.   C difficile quick scan w PCR reflex     Status: None   Collection Time: 06/15/17  6:27 PM  Result Value Ref Range Status   C Diff antigen NEGATIVE NEGATIVE Final   C Diff toxin NEGATIVE NEGATIVE Final   C Diff interpretation No C. difficile detected.  Final  Culture, Urine     Status: None   Collection Time: 06/30/17 11:30 AM  Result Value Ref Range Status   Specimen Description URINE, CLEAN CATCH  Final   Special Requests NONE  Final   Culture   Final    NO GROWTH Performed at [facility], [address], [place], [place] [address]    Report Status 07/01/2017 FINAL  Final  Blood culture (routine x 2)     Status: None (Preliminary result)   Collection Time: 07/11/17 12:30 AM  Result Value  Ref Range Status   Specimen Description RIGHT ANTECUBITAL  Final   Special Requests   Final    BOTTLES DRAWN AEROBIC AND ANAEROBIC Blood Culture adequate volume   Culture PENDING  Incomplete   Report Status PENDING  Incomplete  Blood culture (routine x 2)     Status: None (Preliminary result)   Collection Time: 07/11/17 12:45 AM  Result Value Ref Range Status   Specimen Description BLOOD RIGHT ARM  Final   Special Requests   Final    BOTTLES DRAWN AEROBIC ONLY Blood Culture adequate volume   Culture PENDING  Incomplete   Report Status PENDING  Incomplete    Medical History: Past Medical History:  Diagnosis Date  . Anemia   . Arthritis   . Back pain, chronic   . Bilateral carotid bruits   . C. difficile colitis 09/2011   . CAD (coronary artery disease)   . Carotid artery stenosis   . Cerebrovascular disease   . Colon polyps   . Dyslipidemia   . Dysphagia 10/07/2011  . Encephalopathy   . Gait disorder   . HA (headache)   . High grade dysplasia in colonic adenoma 09/2005  . History of kidney stones   . HTN (hypertension), malignant 10/06/2011  . Hypernatremia   . Hypokalemia   . Hypothyroidism 10/08/2011  . Insomnia   . Junctional rhythm   . Kidney stones   . MS (multiple sclerosis) ([place])   . Neuromuscular disorder ([place])   . OSA (obstructive sleep apnea)   . Paroxysmal atrial tachycardia ([place])   . Peripheral vascular disease ([place])   . Pneumonia 4 yrs ago  . Pulmonary fibrosis ([place]) 10/06/2011  . Pulmonary nodule 10/08/2011  . PVD (peripheral vascular disease) ([place])   . Sacral ulcer ([place])   . Sleep apnea   . Stroke ([place])   . TIA (transient ischemic attack)   . Tremors of nervous system 10/08/2011  . Urinary tract infection     Medications:  Scheduled:  . carvedilol  6.25 mg Oral BID WC  . diazepam  5 mg Oral BID  . heparin  5,000 Units Subcutaneous Q8H  . levothyroxine  88 mcg Oral QAC breakfast  . verapamil  40 mg Oral BID   Infusions:  . sodium chloride    . meropenem (MERREM) IV    . sodium chloride 1,000 mL (07/11/17 0236)   PRN: acetaminophen **OR** acetaminophen, fentaNYL (SUBLIMAZE) injection, hydrALAZINE, HYDROcodone-acetaminophen, ondansetron **OR** ondansetron (ZOFRAN) IV Anti-infectives    Start     Dose/Rate Route Frequency Ordered Stop   07/11/17 0330  meropenem (MERREM) 1 g in sodium chloride 0.9 % 100 mL IVPB     1 g 200 mL/hr over 30 Minutes Intravenous  Once 07/11/17 0321     07/11/17 0215  cefTRIAXone (ROCEPHIN) 1 g in dextrose 5 % 50 mL IVPB     1 g 100 mL/hr over 30 Minutes Intravenous  Once 07/11/17 0203 07/11/17 1950      Assessment: 76 yo male with hx MS with neurogenic bladder.  Recently hospitalized with urosepsis, PNA, and left ureteral stone.  Starting  meropenem for UTI.    Plan:  Preliminary review of pertinent patient information completed.  Protocol will be initiated with dose(s) of meropenem 1 gram x 1.  Forestine Na clinical pharmacist will complete review during morning rounds to assess patient and finalize treatment regimen if needed.  [place], Tiffany Scarlett, RPH 07/11/2017,3:30 AM   Addum:  Continue meropenem 1gm IV q12 hours  F/u renal function, cultures and clinical course Excell Seltzer, PharmD

## 2017-07-11 NOTE — ED Notes (Signed)
Patient's wife states she is leaving and wants to leave her name and number to call if needed. Name is Ishmael Berkovich, number is [phone].

## 2017-07-11 NOTE — Progress Notes (Signed)
NG tube placed in right nare, patient tolerated well. No reports of discomfort.

## 2017-07-11 NOTE — ED Provider Notes (Signed)
[facility] DEPT Provider Note   CSN: [id number] Arrival date & time: 07/10/17  1909     History   Chief Complaint Chief Complaint  Patient presents with  . Dysuria    HPI LEAH SKORA is a 76 y.o. male with a history of MS resulting in neurogenic bladder and resultant suprapubic catheter presenting with dysuria and decreased urine production and suprapubic pressure sensation.  He was seen by his PCP this a.m and diagnosed with a UTI and was started on Levaquin today.  He states he generally has to empty his leg bag 4 times per day, today has only emptied it once.  His current catheter is 10-week-old.  He flushed the catheter without improvement in flow.  He states he will generally have leaking around the tube when the tube is blocked but that has not occurred today.  He also has a history of kidney stones and was recently hospitalized with urosepsis, pneumonia and a left sided ureteral stone that was relieved with stent placement which has since been removed.  He has a atrophic right kidney.  He denies fevers or chills, he has had nausea and emesis today.  He denies constipation or diarrhea, last BM was this morning.  He does not feel better since starting this new antibiotic.  The history is provided by the patient and the spouse.    Past Medical History:  Diagnosis Date  . Anemia   . Arthritis   . Back pain, chronic   . Bilateral carotid bruits   . C. difficile colitis 09/2011  . CAD (coronary artery disease)   . Carotid artery stenosis   . Cerebrovascular disease   . Colon polyps   . Dyslipidemia   . Dysphagia 10/07/2011  . Encephalopathy   . Gait disorder   . HA (headache)   . High grade dysplasia in colonic adenoma 09/2005  . History of kidney stones   . HTN (hypertension), malignant 10/06/2011  . Hypernatremia   . Hypokalemia   . Hypothyroidism 10/08/2011  . Insomnia   . Junctional rhythm   . Kidney stones   . MS (multiple sclerosis) ([place])   . Neuromuscular disorder  ([place])   . OSA (obstructive sleep apnea)   . Paroxysmal atrial tachycardia ([place])   . Peripheral vascular disease ([place])   . Pneumonia 4 yrs ago  . Pulmonary fibrosis ([place]) 10/06/2011  . Pulmonary nodule 10/08/2011  . PVD (peripheral vascular disease) ([place])   . Sacral ulcer ([place])   . Sleep apnea   . Stroke ([place])   . TIA (transient ischemic attack)   . Tremors of nervous system 10/08/2011  . Urinary tract infection     Patient Active Problem List   Diagnosis Date Noted  . Sepsis secondary to UTI ([place]) 06/14/2017  . Paroxysmal atrial tachycardia ([place]) 06/14/2017  . Hypophosphatemia 06/14/2017  . Pressure injury of skin 05/26/2017  . Hydronephrosis due to obstruction of ureter 05/26/2017  . Obstructive uropathy 05/25/2017  . CKD (chronic kidney disease), stage III ([place]) 08/23/2016  . Hydronephrosis of left kidney 08/23/2016  . Kidney stones 09/13/2015  . Essential hypertension   . Pressure ulcer 08/04/2015  . Acute renal failure superimposed on stage 3 chronic kidney disease ([place]) 08/04/2015  . Chronic diastolic (congestive) heart failure ([place]) 08/04/2015  . Hydronephrosis with obstructing calculus 08/04/2015  . Occult blood positive stool 09/05/2014  . Rash and nonspecific skin eruption 08/25/2014  . Edema 08/19/2014  . Subacute confusional state 08/06/2014  . Dilated cardiomyopathy (  [place]) 07/21/2014  . Sepsis ([place]) 07/17/2014  . Severe sepsis ([place]) 07/17/2014  . Headache 03/13/2014  . CVA (cerebral infarction) 03/13/2014  . Abnormality of gait 03/13/2014  . Tremors of nervous system 10/08/2011  . Hypothyroidism 10/08/2011  . Pulmonary nodule 10/08/2011  . Dysphagia 10/07/2011  . HTN (hypertension), malignant 10/06/2011  . Chronic suprapubic catheter ([place]) 10/06/2011  . Pulmonary fibrosis ([place]) 10/06/2011  . Lower urinary tract infectious disease 10/03/2011  . PNA (pneumonia) 10/02/2011  . Junctional rhythm 10/02/2011  . Multiple sclerosis ([place]) 10/02/2011  . Anemia  06/18/2011  . Chronic diarrhea 06/17/2011  . Hx of adenomatous colonic polyps 06/17/2011  . High grade dysplasia in colonic adenoma 09/29/2005    Past Surgical History:  Procedure Laterality Date  . APPENDECTOMY  09/2005   at time of left hemicolectomy  . BACK SURGERY  1976/1979   lower  . CHOLECYSTECTOMY     Dr. Tamala Julian  . COLON SURGERY  09/2005   [place]: four tubular adenomas, large adenomatous polyp with HIGH GRADE dysplasia  . COLONOSCOPY  11/2004   Dr. Sharol Roussel sessile polyp splenic flexure, 54mm sessile polyp desc colon, tubulovillous adenoma (bx not removed)  . COLONOSCOPY  01/2005   poor prep, polyp could not be found  . COLONOSCOPY  05/2005   with EMR, polypectomy Dr. Olegario Messier, bx showed high grade dysplasia, partially resected  . COLONOSCOPY  09/2005   Dr. Arsenio Loader, [place] ink tattooing, four villous colon polyp (3 had been missed on previous colonoscopies due to limitations of procedures  . COLONOSCOPY  09/2006   normal TI, no polyps  . COLONOSCOPY  10/2007   Dr. Imogene Burn distal mammillations, benign bx, normal TI, random bx neg for microscopic colitis  . CYSTOSCOPY WITH RETROGRADE PYELOGRAM, URETEROSCOPY AND STENT PLACEMENT Left 06/09/2017   Procedure: CYSTOSCOPY WITH LEFT RETROGRADE PYELOGRAM, LEFT URETEROSCOPY, LEFT URETEROSCOPIC STONE EXTRACTION, LEFT URETERAL STENT PLACEMENT;  Surgeon: Franchot Gallo, MD;  Location: [facility];  Service: Urology;  Laterality: Left;  . INGUINAL HERNIA REPAIR  1971   bilateral  . INSERTION OF SUPRAPUBIC CATHETER  06/09/2017   Procedure: EXCHANGE OF SUPRAPUBIC CATHETER;  Surgeon: Franchot Gallo, MD;  Location: [facility];  Service: Urology;;  . IR NEPHROSTOMY PLACEMENT LEFT  05/26/2017  . KIDNEY STONE SURGERY  09/13/2015  . NEPHROLITHOTOMY Left 09/13/2015   Procedure: LEFT PERCUTANEOUS NEPHROLITHOTOMY ;  Surgeon: Franchot Gallo, MD;  Location: [facility];  Service: Urology;  Laterality: Left;  . SUPRAPUBIC CATHETER INSERTION          Home Medications    Prior to Admission medications   Medication Sig Start Date End Date Taking? Authorizing Provider  carvedilol (COREG) 6.25 MG tablet Take 1 tablet (6.25 mg total) by mouth 2 (two) times daily with a meal. 07/26/14   Verlee Monte, MD  diazepam (VALIUM) 5 MG tablet TAKE 1 TABLET BY MOUTH TWICE A DAY 03/14/17   Sater, Nanine Means, MD  HYDROcodone-acetaminophen (NORCO) 7.5-325 MG per tablet Take 1 tablet by mouth 2 (two) times daily. Max APAP 3 GM IN 24 HOURS FROM ALL SOURCES Patient taking differently: Take 1 tablet by mouth 2 (two) times daily. May take up to 4 times daily as needed for pain 08/22/14   Blanchie Serve, MD  ibuprofen (ADVIL,MOTRIN) 200 MG tablet Take 200 mg by mouth every 6 (six) hours as needed for fever.    [provider]  levothyroxine (SYNTHROID, LEVOTHROID) 88 MCG tablet Take 88 mcg by mouth daily before breakfast.    [provider]  potassium chloride SA (K-DUR,KLOR-CON) 20 MEQ tablet Take 20 mEq by mouth daily.    Rexene Alberts, MD  predniSONE (STERAPRED UNI-PAK 21 TAB) 10 MG (21) TBPK tablet Take by package instructions 06/18/17   Sinda Du, MD  solifenacin (VESICARE) 10 MG tablet Take 10 mg by mouth daily as needed (for spasms).     [provider]  verapamil (CALAN) 40 MG tablet Take 40 mg by mouth 2 (two) times daily.    [provider]    Family History Family History  Problem Relation Age of Onset  . Cirrhosis Brother        etoh  . Stroke Mother 90  . Coronary artery disease Father 1  . Heart attack Brother   . Cancer Sister   . Multiple sclerosis Unknown   . Colon cancer Neg Hx     Social History Social History  Substance Use Topics  . Smoking status: Former Smoker    Types: Cigarettes  . Smokeless tobacco: Never Used     Comment: quit remote  . Alcohol use No     Allergies   Tetracyclines & related and Ciprofloxacin   Review of Systems Review of Systems  Constitutional:  Negative for fever.  HENT: Negative for congestion and sore throat.   Eyes: Negative.   Respiratory: Negative for chest tightness and shortness of breath.   Cardiovascular: Negative for chest pain.  Gastrointestinal: Positive for nausea and vomiting. Negative for abdominal pain.  Genitourinary: Positive for decreased urine volume and dysuria. Negative for hematuria.  Musculoskeletal: Negative for arthralgias, joint swelling and neck pain.  Skin: Negative.  Negative for rash and wound.  Neurological: Negative for dizziness, weakness, light-headedness, numbness and headaches.  Psychiatric/Behavioral: Negative.      Physical Exam Updated Vital Signs BP (!) 183/80   Pulse 77   Temp (!) 100.4 F (38 C) (Rectal)   Resp (!) 22   Ht 5\' 5"  (1.651 m)   Wt 61.2 kg (135 lb)   SpO2 91%   BMI 22.47 kg/m   Physical Exam  Constitutional: He appears well-developed and well-nourished.  HENT:  Head: Normocephalic and atraumatic.  Eyes: Conjunctivae are normal.  Neck: Normal range of motion.  Cardiovascular: Normal rate, regular rhythm, normal heart sounds and intact distal pulses.   Pulmonary/Chest: Effort normal and breath sounds normal. He has no wheezes.  Abdominal: Soft. Bowel sounds are normal. He exhibits distension. There is tenderness in the suprapubic area. There is no rigidity and no guarding.  Genitourinary:  Genitourinary Comments: Suprapubic catheter is clean, there is no leakage around the tube.  Leg bag is approximately half full with urine.  Musculoskeletal: Normal range of motion.  Neurological: He is alert.  Skin: Skin is warm and dry.  Psychiatric: He has a normal mood and affect.  Nursing note and vitals reviewed.    ED Treatments / Results  Labs (all labs ordered are listed, but only abnormal results are displayed) Labs Reviewed  COMPREHENSIVE METABOLIC PANEL - Abnormal; Notable for the following:       Result Value   Glucose, Bld 122 (*)    Creatinine, Ser 1.69  (*)    AST 61 (*)    Alkaline Phosphatase 148 (*)    GFR calc non Af Amer 38 (*)    GFR calc Af Amer 44 (*)    All other components within normal limits  URINALYSIS, ROUTINE W REFLEX MICROSCOPIC - Abnormal; Notable for the following:  Hgb urine dipstick SMALL (*)    Ketones, ur 5 (*)    Protein, ur 30 (*)    Nitrite POSITIVE (*)    Leukocytes, UA MODERATE (*)    Squamous Epithelial / LPF 0-5 (*)    All other components within normal limits  CULTURE, BLOOD (ROUTINE X 2)  CULTURE, BLOOD (ROUTINE X 2)  URINE CULTURE  CBC WITH DIFFERENTIAL/PLATELET  I-STAT CG4 LACTIC ACID, ED    EKG  EKG Interpretation None       Radiology Dg Chest 2 View  Result Date: 07/11/2017 CLINICAL DATA:  Dysuria today. Recent antibiotic treatment for urinary tract infection. Fever. EXAM: CHEST  2 VIEW COMPARISON:  06/14/2017 FINDINGS: Emphysematous disease and chronic appearing interstitial coarsening persist without significant interval change. No confluent airspace consolidation. No effusions. Normal heart size. Unremarkable hilar and mediastinal contours. Normal pulmonary vasculature. IMPRESSION: Chronic emphysematous and fibrotic changes. No airspace consolidation. No effusion. Electronically Signed   By: Andreas Newport M.D.   On: 07/11/2017 01:30    Procedures Procedures (including critical care time)  Medications Ordered in ED Medications  ondansetron (ZOFRAN-ODT) disintegrating tablet 4 mg (4 mg Oral Given 07/10/17 2206)     Initial Impression / Assessment and Plan / ED Course  I have reviewed the triage vital signs and the nursing notes.  Pertinent labs & imaging results that were available during my care of the patient were reviewed by me and considered in my medical decision making (see chart for details).     Pt pending labs and imaging.  Discussed with Dr Wyvonnia Dusky who will follow and dispo pt.   Final Clinical Impressions(s) / ED Diagnoses   Final diagnoses:  None    New  Prescriptions New Prescriptions   No medications on file     Landis Martins 07/11/17 0149    Ezequiel Essex, MD 07/11/17 [phone]

## 2017-07-11 NOTE — H&P (Signed)
History and Physical    DENISE BRAMBLETT [id number] DOB: April 08, 1941 DOA: 07/10/2017  PCP: Sinda Du, MD   Patient coming from: Home  Chief Complaint: Abdominal pain, nausea   HPI: Jordan Ward is a pleasant, yet medically-complex 76 y.o. male with history of double sclerosis with neurogenic bladder, hypertension, paroxysmal atrial tachycardia, hypothyroidism, and chronic kidney disease stage III, now presenting to the emergency department for evaluation of lower abdominal pain. Patient reports that he been in his usual state of health until the insidious development of lower abdominal pain over the past couple days. He saw his PCP for this yesterday, was diagnosed with UTI, and started on Levaquin. Unfortunately, after returning home, pain continued to worsen, prompting his presentation in the ED tonight. He reports some nausea with an episode of nonbloody vomiting. Denies flank pain. Denies chest pain.  ED Course: Upon arrival to the ED, patient is found to be febrile to 38 C, saturating low 90s on room air, slightly tachypneic, and hypertensive to 190/90. Chest x-ray features of chronic emphysematous and fibrotic changes, but no acute disease. Chemistry panel reveals a serum creatinine 1.69, consistent with his apparent baseline, and a slight elevation in AST. CBC is unremarkable. Lactic acid is reassuring at 1.52. Urinalysis features moderate leukocytes and positive nitrites. CT of the abdomen and pelvis is notable for bladder Tomaselli thickening consistent with cystitis, thickening of the proximal ureteral Petitti suggestive of ureteritis, dilation of the CBD with stone at the level of the pancreatic head, and dilated loops of small bowel just proximal to a large ventral hernia suggesting partial SBO. Patient was treated with a liter of normal saline and empiric Rocephin in the ED. He remained hypertensive, but otherwise stable, and will be admitted to the medical surgical unit for ongoing  evaluation and management of UTI and partial SBO.   Review of Systems:  All other systems reviewed and apart from HPI, are negative.  Past Medical History:  Diagnosis Date  . Anemia   . Arthritis   . Back pain, chronic   . Bilateral carotid bruits   . C. difficile colitis 09/2011  . CAD (coronary artery disease)   . Carotid artery stenosis   . Cerebrovascular disease   . Colon polyps   . Dyslipidemia   . Dysphagia 10/07/2011  . Encephalopathy   . Gait disorder   . HA (headache)   . High grade dysplasia in colonic adenoma 09/2005  . History of kidney stones   . HTN (hypertension), malignant 10/06/2011  . Hypernatremia   . Hypokalemia   . Hypothyroidism 10/08/2011  . Insomnia   . Junctional rhythm   . Kidney stones   . MS (multiple sclerosis) ([place])   . Neuromuscular disorder ([place])   . OSA (obstructive sleep apnea)   . Paroxysmal atrial tachycardia ([place])   . Peripheral vascular disease ([place])   . Pneumonia 4 yrs ago  . Pulmonary fibrosis ([place]) 10/06/2011  . Pulmonary nodule 10/08/2011  . PVD (peripheral vascular disease) ([place])   . Sacral ulcer ([place])   . Sleep apnea   . Stroke ([employer])   . TIA (transient ischemic attack)   . Tremors of nervous system 10/08/2011  . Urinary tract infection     Past Surgical History:  Procedure Laterality Date  . APPENDECTOMY  09/2005   at time of left hemicolectomy  . BACK SURGERY  1976/1979   lower  . CHOLECYSTECTOMY     Dr. Tamala Julian  . COLON SURGERY  09/2005  [place]: four tubular adenomas, large adenomatous polyp with HIGH GRADE dysplasia  . COLONOSCOPY  11/2004   Dr. Sharol Roussel sessile polyp splenic flexure, 12mm sessile polyp desc colon, tubulovillous adenoma (bx not removed)  . COLONOSCOPY  01/2005   poor prep, polyp could not be found  . COLONOSCOPY  05/2005   with EMR, polypectomy Dr. Olegario Messier, bx showed high grade dysplasia, partially resected  . COLONOSCOPY  09/2005   Dr. Arsenio Loader, [place] ink tattooing, four villous colon polyp (3  had been missed on previous colonoscopies due to limitations of procedures  . COLONOSCOPY  09/2006   normal TI, no polyps  . COLONOSCOPY  10/2007   Dr. Imogene Burn distal mammillations, benign bx, normal TI, random bx neg for microscopic colitis  . CYSTOSCOPY WITH RETROGRADE PYELOGRAM, URETEROSCOPY AND STENT PLACEMENT Left 06/09/2017   Procedure: CYSTOSCOPY WITH LEFT RETROGRADE PYELOGRAM, LEFT URETEROSCOPY, LEFT URETEROSCOPIC STONE EXTRACTION, LEFT URETERAL STENT PLACEMENT;  Surgeon: Franchot Gallo, MD;  Location: [facility];  Service: Urology;  Laterality: Left;  . INGUINAL HERNIA REPAIR  1971   bilateral  . INSERTION OF SUPRAPUBIC CATHETER  06/09/2017   Procedure: EXCHANGE OF SUPRAPUBIC CATHETER;  Surgeon: Franchot Gallo, MD;  Location: [facility];  Service: Urology;;  . IR NEPHROSTOMY PLACEMENT LEFT  05/26/2017  . KIDNEY STONE SURGERY  09/13/2015  . NEPHROLITHOTOMY Left 09/13/2015   Procedure: LEFT PERCUTANEOUS NEPHROLITHOTOMY ;  Surgeon: Franchot Gallo, MD;  Location: [facility];  Service: Urology;  Laterality: Left;  . SUPRAPUBIC CATHETER INSERTION       reports that he has quit smoking. His smoking use included Cigarettes. He has never used smokeless tobacco. He reports that he does not drink alcohol or use drugs.  Allergies  Allergen Reactions  . Tetracyclines & Related Anaphylaxis and Rash  . Ciprofloxacin     Trouble swallowing unknown reaction according to wife     Family History  Problem Relation Age of Onset  . Cirrhosis Brother        etoh  . Stroke Mother 45  . Coronary artery disease Father 7  . Heart attack Brother   . Cancer Sister   . Multiple sclerosis Unknown   . Colon cancer Neg Hx      Prior to Admission medications   Medication Sig Start Date End Date Taking? Authorizing Provider  carvedilol (COREG) 6.25 MG tablet Take 1 tablet (6.25 mg total) by mouth 2 (two) times daily with a meal. 07/26/14   Verlee Monte, MD  diazepam (VALIUM) 5 MG tablet TAKE 1  TABLET BY MOUTH TWICE A DAY 03/14/17   Sater, Nanine Means, MD  HYDROcodone-acetaminophen (NORCO) 7.5-325 MG per tablet Take 1 tablet by mouth 2 (two) times daily. Max APAP 3 GM IN 24 HOURS FROM ALL SOURCES Patient taking differently: Take 1 tablet by mouth 2 (two) times daily. May take up to 4 times daily as needed for pain 08/22/14   Blanchie Serve, MD  ibuprofen (ADVIL,MOTRIN) 200 MG tablet Take 200 mg by mouth every 6 (six) hours as needed for fever.    [provider]  levothyroxine (SYNTHROID, LEVOTHROID) 88 MCG tablet Take 88 mcg by mouth daily before breakfast.    [provider]  potassium chloride SA (K-DUR,KLOR-CON) 20 MEQ tablet Take 20 mEq by mouth daily.    Rexene Alberts, MD  predniSONE (STERAPRED UNI-PAK 21 TAB) 10 MG (21) TBPK tablet Take by package instructions 06/18/17   Sinda Du, MD  solifenacin (VESICARE) 10 MG tablet Take 10 mg by mouth  daily as needed (for spasms).     [provider]  verapamil (CALAN) 40 MG tablet Take 40 mg by mouth 2 (two) times daily.    [provider]    Physical Exam: Vitals:   07/10/17 2330 07/11/17 0054 07/11/17 0200 07/11/17 0230  BP: (!) 196/83  (!) 180/86 (!) 200/79  Pulse: 74  75 72  Resp:    20  Temp:  (!) 100.4 F (38 C)    TempSrc:  Rectal    SpO2: 98%  (!) 87% 94%  Weight:      Height:          Constitutional: NAD, calm, in apparent discomfort Eyes: PERTLA, lids and conjunctivae normal ENMT: Mucous membranes are moist. Posterior pharynx clear of any exudate or lesions.   Neck: normal, supple, no masses, no thyromegaly Respiratory: Slightly diminished breath sounds bilaterally. No wheeze or rhonchi. Normal respiratory effort.    Cardiovascular: S1 & S2 heard, regular rate and rhythm. No extremity edema. No significant JVD. Abdomen: mild distension, tender in lower abdomen without rebound pain or guarding. Occasional bowel sound appreciated.  Musculoskeletal: no clubbing / cyanosis. No  joint deformity upper and lower extremities.   Skin: no significant rashes, lesions, ulcers. Poor turgor. Neurologic: No gross facial asymmetry. Sensation intact. Moving all extremities   Psychiatric:  Alert and oriented x 3. Calm, cooperative.     Labs on Admission: I have personally reviewed following labs and imaging studies  CBC:  Recent Labs Lab 07/10/17 2219  WBC 8.3  NEUTROABS 5.8  HGB 14.0  HCT 42.6  MCV 94.2  PLT 846   Basic Metabolic Panel:  Recent Labs Lab 07/10/17 2219  NA 139  K 4.8  CL 101  CO2 27  GLUCOSE 122*  BUN 19  CREATININE 1.69*  CALCIUM 9.5   GFR: Estimated Creatinine Clearance: 33.2 mL/min (A) (by C-G formula based on SCr of 1.69 mg/dL (H)). Liver Function Tests:  Recent Labs Lab 07/10/17 2219  AST 61*  ALT 61  ALKPHOS 148*  BILITOT 1.1  PROT 7.6  ALBUMIN 3.7   No results for input(s): LIPASE, AMYLASE in the last 168 hours. No results for input(s): AMMONIA in the last 168 hours. Coagulation Profile: No results for input(s): INR, PROTIME in the last 168 hours. Cardiac Enzymes: No results for input(s): CKTOTAL, CKMB, CKMBINDEX, TROPONINI in the last 168 hours. BNP (last 3 results) No results for input(s): PROBNP in the last 8760 hours. HbA1C: No results for input(s): HGBA1C in the last 72 hours. CBG: No results for input(s): GLUCAP in the last 168 hours. Lipid Profile: No results for input(s): CHOL, HDL, LDLCALC, TRIG, CHOLHDL, LDLDIRECT in the last 72 hours. Thyroid Function Tests: No results for input(s): TSH, T4TOTAL, FREET4, T3FREE, THYROIDAB in the last 72 hours. Anemia Panel: No results for input(s): VITAMINB12, FOLATE, FERRITIN, TIBC, IRON, RETICCTPCT in the last 72 hours. Urine analysis:    Component Value Date/Time   COLORURINE YELLOW 07/11/2017 0045   APPEARANCEUR CLEAR 07/11/2017 0045   LABSPEC 1.013 07/11/2017 0045   PHURINE 6.0 07/11/2017 0045   GLUCOSEU NEGATIVE 07/11/2017 0045   HGBUR SMALL (A) 07/11/2017  0045   BILIRUBINUR NEGATIVE 07/11/2017 0045   KETONESUR 5 (A) 07/11/2017 0045   PROTEINUR 30 (A) 07/11/2017 0045   UROBILINOGEN 0.2 07/17/2014 1215   NITRITE POSITIVE (A) 07/11/2017 0045   LEUKOCYTESUR MODERATE (A) 07/11/2017 0045   Sepsis Labs: @LABRCNTIP (procalcitonin:4,lacticidven:4) ) Recent Results (from the past 240 hour(s))  Blood culture (routine  x 2)     Status: None (Preliminary result)   Collection Time: 07/11/17 12:30 AM  Result Value Ref Range Status   Specimen Description RIGHT ANTECUBITAL  Final   Special Requests   Final    BOTTLES DRAWN AEROBIC AND ANAEROBIC Blood Culture adequate volume   Culture PENDING  Incomplete   Report Status PENDING  Incomplete  Blood culture (routine x 2)     Status: None (Preliminary result)   Collection Time: 07/11/17 12:45 AM  Result Value Ref Range Status   Specimen Description BLOOD RIGHT ARM  Final   Special Requests   Final    BOTTLES DRAWN AEROBIC ONLY Blood Culture adequate volume   Culture PENDING  Incomplete   Report Status PENDING  Incomplete     Radiological Exams on Admission: Dg Chest 2 View  Result Date: 07/11/2017 CLINICAL DATA:  Dysuria today. Recent antibiotic treatment for urinary tract infection. Fever. EXAM: CHEST  2 VIEW COMPARISON:  06/14/2017 FINDINGS: Emphysematous disease and chronic appearing interstitial coarsening persist without significant interval change. No confluent airspace consolidation. No effusions. Normal heart size. Unremarkable hilar and mediastinal contours. Normal pulmonary vasculature. IMPRESSION: Chronic emphysematous and fibrotic changes. No airspace consolidation. No effusion. Electronically Signed   By: Andreas Newport M.D.   On: 07/11/2017 01:30   Ct Renal Stone Study  Result Date: 07/11/2017 CLINICAL DATA:  Acute onset of dysuria and difficulty urinating. Initial encounter. EXAM: CT ABDOMEN AND PELVIS WITHOUT CONTRAST TECHNIQUE: Multidetector CT imaging of the abdomen and pelvis was  performed following the standard protocol without IV contrast. COMPARISON:  CT of the abdomen and pelvis performed 06/14/2017 FINDINGS: Lower chest: Diffuse coronary artery calcifications are seen. Scarring and nodularity are noted at the right lung base. Hepatobiliary: The liver is unremarkable in appearance. The patient is status post cholecystectomy, with clips noted at the gallbladder fossa. There is dilatation of the common bile duct to 1.5 cm, with an apparent obstructing 1.4 cm stone at the level of the pancreatic head. Trace ascites is noted tracking about the liver. Pancreas: The pancreas is within normal limits. Spleen: The spleen is unremarkable in appearance. Adrenals/Urinary Tract: The adrenal glands are unremarkable in appearance. There is severe right renal atrophy. Minimal left-sided hydronephrosis is noted. There is diffuse Morneault thickening at the left renal pelvis and proximal left ureter, raising question for left-sided ureteritis. Bilateral perinephric stranding is noted. Bilateral renal cysts are seen. A nonobstructing 6 mm stone is noted at the lower pole of the right kidney. Stomach/Bowel: The stomach is unremarkable in appearance. There is dilatation of small-bowel loops up to 4.9 cm in maximal diameter, with mild fecalization, just proximal to a relatively large anterior abdominal Steveson hernia. There is herniation of multiple small bowel loops into the hernia. Mild associated mesenteric edema is noted. This is mildly worsened from September, and concerning for partial small bowel obstruction or significant dysmotility secondary to the hernia. Small-bowel loops within the pelvis demonstrate mild diffuse Muhlestein thickening, concerning for ileitis. This is new from September. A small amount of free fluid is noted within the pelvis. The patient is status post appendectomy. Postoperative change is noted at the proximal sigmoid colon. The remaining colon is unremarkable in appearance.  Vascular/Lymphatic: Diffuse calcification is seen along the abdominal aorta and its branches. There is marked narrowing of the distal abdominal aorta, raising concern for severe luminal narrowing or occlusion. There is associated diffuse narrowing of the common, internal and external iliac arteries. The inferior vena cava is grossly  unremarkable. No retroperitoneal lymphadenopathy is seen. No pelvic sidewall lymphadenopathy is identified. Reproductive: The bladder is relatively decompressed. A suprapubic catheter is noted. Bladder Geise thickening could reflect cystitis. The prostate is normal in size. Other: No additional soft tissue abnormalities are seen. Musculoskeletal: No acute osseous abnormalities are identified. Vacuum phenomenon is noted at L5-S1. The visualized musculature is unremarkable in appearance. IMPRESSION: 1. Bladder Milbrath thickening could reflect cystitis. 2. Small-bowel loops within the pelvis demonstrate mild diffuse Crill thickening, concerning for ileitis. This is new from September. 3. Dilatation of small-bowel loops up to 4.9 cm in maximal diameter, with mild fecalization, just proximal to a relatively large anterior abdominal Tullis hernia. Herniation of multiple small bowel loops into the hernia. Mild associated mesenteric edema. This is mildly worsened from September, and concerning for partial small bowel obstruction or significant dysmotility secondary to the hernia. 4. Diffuse Helin thickening at the left renal pelvis and proximal left ureter, raising concern for left-sided ureteritis. Minimal left-sided hydronephrosis noted. 5. Distention of the common bile duct to 1.5 cm. Apparent obstructing 1.4 cm stone at the level of the pancreatic head. This stone has progressed distally since September. 6. Trace ascites within the abdomen and pelvis. 7. Diffuse coronary artery calcifications. 8. Severe right renal atrophy. A 6 mm nonobstructing stone at the lower pole of the right kidney. 9.  Diffuse aortic atherosclerosis. Marked narrowing of the distal abdominal aorta, raising concern for severe luminal narrowing or occlusion. Associated diffuse narrowing of the common, internal and external iliac arteries. Electronically Signed   By: Garald Balding M.D.   On: 07/11/2017 01:53    EKG: Not performed.   Assessment/Plan  1. UTI  - Patient presents with lower abd pain, UA with leukocytes and nitrites, fever, and CT-findings of bladder-Trahan and proxima left ureteral thickening suggestive of cystitis and uretritis  - Blood and urine cultures were collected in ED and pt was treated with empiric Rocephin  - Given his hx of MDR Pseudomonas in urine, will continue empiric treatment with meropenem pending culture data   2. Partial SBO  - Pt presents with abd pain, suspected secondary to UTI, though also noted to have likely partial SBO on CT  - Plan to treat conservatively for now with bowel-rest, analgesia, antiemetics, IVF hydration   3. Choledocholithiasis  - CBD dilation noted on CT with stone at the level of the pancreatic head, having moved distally since the prior study  - Suspect his fever and abd pain is secondary to #1 and #2 - AST is 61, LFT's otherwise normal, will check lipase and repeat CMP in am, abx and bowel rest as above    4. Hypertension with hypertensive urgency  - BP elevated to the 200/100 range on admission - Pain and nausea likely contributing  - Continue Coreg and verapamil, use hydralazine IVP's prn    5. Multiple sclerosis  - Stable, continue Valium    6. CKD stage III  - SCr is 1.69 on admission, stable relative to priors  - Renally-dose medications as needed, provide IVF hydration while NPO    7. Paroxysmal atrial tachycardia  - In a normal sinus rhythm on admission  - Continue verapamil   8. Hypothyroidism  - Continue Synthroid   DVT prophylaxis: sq heparin Code Status: Full  Family Communication: Discussed with patient Disposition Plan:  Admit to med-surg Consults called: None Admission status: Inpatient    Vianne Bulls, MD [facility] Pager [phone]  If 7PM-7AM, please contact night-coverage [email or web address]  Password [email or web address]  07/11/2017, 2:57 AM

## 2017-07-11 NOTE — ED Notes (Signed)
Doppler used for lower extremity pulse check. Pulse rate 68-69 bpm in bilateral lower extremity via doppler.

## 2017-07-11 NOTE — Consult Note (Signed)
[facility] Consult  Reason for Consult: pSBO  Referring Physician: Dr. Luan Pulling   Chief Complaint    Dysuria      Jordan Ward is a 76 y.o. male.  HPI: Jordan Ward is a 77 yo with multiple medical issues coming in to the hospital with a UTI related to his suprapubic catheter for MS, and also abdominal pain with CT findings of dilated SB with fecalization.  The patient has been having some abdominal pain and nausea / vomiting over the past day. He report he has chronic constipation, but had diarrhea yesterday AM and has been passing some flatus. Currently he remains nauseated. He had refused NG placement by the RN when I talked to Dr. Standley Dakins initially. Patient shaking currently with fevers and chills.     Past Medical History:  Diagnosis Date  . Anemia   . Arthritis   . Back pain, chronic   . Bilateral carotid bruits   . C. difficile colitis 09/2011  . CAD (coronary artery disease)   . Carotid artery stenosis   . Cerebrovascular disease   . Colon polyps   . Dyslipidemia   . Dysphagia 10/07/2011  . Encephalopathy   . Gait disorder   . HA (headache)   . High grade dysplasia in colonic adenoma 09/2005  . History of kidney stones   . HTN (hypertension), malignant 10/06/2011  . Hypernatremia   . Hypokalemia   . Hypothyroidism 10/08/2011  . Insomnia   . Junctional rhythm   . Kidney stones   . MS (multiple sclerosis) ([place])   . Neuromuscular disorder ([place])   . OSA (obstructive sleep apnea)   . Paroxysmal atrial tachycardia ([place])   . Peripheral vascular disease ([place])   . Pneumonia 4 yrs ago  . Pulmonary fibrosis ([place]) 10/06/2011  . Pulmonary nodule 10/08/2011  . PVD (peripheral vascular disease) ([place])   . Sacral ulcer ([place])   . Sleep apnea   . Stroke ([place])   . TIA (transient ischemic attack)   . Tremors of nervous system 10/08/2011  . Urinary tract infection     Past Surgical History:  Procedure Laterality Date  . APPENDECTOMY  09/2005   at time of left  hemicolectomy  . BACK SURGERY  1976/1979   lower  . CHOLECYSTECTOMY     Dr. Tamala Julian  . COLON SURGERY  09/2005   Fleishman: four tubular adenomas, large adenomatous polyp with HIGH GRADE dysplasia  . COLONOSCOPY  11/2004   Dr. Sharol Roussel sessile polyp splenic flexure, 32m sessile polyp desc colon, tubulovillous adenoma (bx not removed)  . COLONOSCOPY  01/2005   poor prep, polyp could not be found  . COLONOSCOPY  05/2005   with EMR, polypectomy Dr. JOlegario Messier bx showed high grade dysplasia, partially resected  . COLONOSCOPY  09/2005   Dr. GArsenio Loader [employer] tattooing, four villous colon polyp (3 had been missed on previous colonoscopies due to limitations of procedures  . COLONOSCOPY  09/2006   normal TI, no polyps  . COLONOSCOPY  10/2007   Dr. RImogene Burndistal mammillations, benign bx, normal TI, random bx neg for microscopic colitis  . CYSTOSCOPY WITH RETROGRADE PYELOGRAM, URETEROSCOPY AND STENT PLACEMENT Left 06/09/2017   Procedure: CYSTOSCOPY WITH LEFT RETROGRADE PYELOGRAM, LEFT URETEROSCOPY, LEFT URETEROSCOPIC STONE EXTRACTION, LEFT URETERAL STENT PLACEMENT;  Surgeon: DFranchot Gallo MD;  Location: [facility];  Service: Urology;  Laterality: Left;  . INGUINAL HERNIA REPAIR  1971   bilateral  . INSERTION OF SUPRAPUBIC CATHETER  06/09/2017  Procedure: EXCHANGE OF SUPRAPUBIC CATHETER;  Surgeon: Franchot Gallo, MD;  Location: [facility];  Service: Urology;;  . IR NEPHROSTOMY PLACEMENT LEFT  05/26/2017  . KIDNEY STONE SURGERY  09/13/2015  . NEPHROLITHOTOMY Left 09/13/2015   Procedure: LEFT PERCUTANEOUS NEPHROLITHOTOMY ;  Surgeon: Franchot Gallo, MD;  Location: [facility];  Service: Urology;  Laterality: Left;  . SUPRAPUBIC CATHETER INSERTION      Family History  Problem Relation Age of Onset  . Cirrhosis Brother        etoh  . Stroke Mother 70  . Coronary artery disease Father 28  . Heart attack Brother   . Cancer Sister   . Multiple sclerosis Unknown   . Colon cancer Neg Hx      Social History  Substance Use Topics  . Smoking status: Former Smoker    Types: Cigarettes  . Smokeless tobacco: Never Used     Comment: quit remote  . Alcohol use No    Medications:  I have reviewed the patient's current medications. Prior to Admission:  Prescriptions Prior to Admission  Medication Sig Dispense Refill Last Dose  . carvedilol (COREG) 6.25 MG tablet Take 1 tablet (6.25 mg total) by mouth 2 (two) times daily with a meal.   07/10/2017 at 800  . diazepam (VALIUM) 5 MG tablet TAKE 1 TABLET BY MOUTH TWICE A DAY 60 tablet 4 07/10/2017 at Unknown time  . HYDROcodone-acetaminophen (NORCO) 7.5-325 MG per tablet Take 1 tablet by mouth 2 (two) times daily. Max APAP 3 GM IN 24 HOURS FROM ALL SOURCES (Patient taking differently: Take 1 tablet by mouth 2 (two) times daily. May take up to 4 times daily as needed for pain) 60 tablet 0 07/10/2017 at Unknown time  . ibuprofen (ADVIL,MOTRIN) 200 MG tablet Take 200 mg by mouth every 6 (six) hours as needed for fever.   unknown  . levothyroxine (SYNTHROID, LEVOTHROID) 88 MCG tablet Take 88 mcg by mouth daily before breakfast.   07/10/2017 at Unknown time  . potassium chloride SA (K-DUR,KLOR-CON) 20 MEQ tablet Take 20 mEq by mouth daily.   07/10/2017 at Unknown time  . solifenacin (VESICARE) 10 MG tablet Take 10 mg by mouth daily as needed (for spasms).    unknown  . verapamil (CALAN) 40 MG tablet Take 40 mg by mouth 2 (two) times daily.   07/10/2017 at Unknown time  . predniSONE (STERAPRED UNI-PAK 21 TAB) 10 MG (21) TBPK tablet Take by package instructions (Patient not taking: Reported on 07/11/2017) 21 tablet 0 Completed Course at Unknown time   Scheduled: . heparin  5,000 Units Subcutaneous Q8H  . levothyroxine  50 mcg Intravenous QAC breakfast  . metoprolol tartrate  2.5 mg Intravenous Q12H   Continuous: . sodium chloride 100 mL/hr at 07/11/17 0437  . meropenem (MERREM) IV    . ondansetron (ZOFRAN) IV     FVC:[id number]  **OR** acetaminophen, fentaNYL (SUBLIMAZE) injection, hydrALAZINE, LORazepam, ondansetron **OR** ondansetron (ZOFRAN) IV  Allergy Allergies  Allergen Reactions  . Tetracyclines & Related Anaphylaxis and Rash  . Ciprofloxacin     Trouble swallowing unknown reaction according to wife      ROS:  A comprehensive review of systems was negative except for: Constitutional: positive for chills, fevers and malaise Gastrointestinal: positive for abdominal pain, constipation, diarrhea, nausea and vomiting Genitourinary: positive for UTI  Blood pressure (!) 138/101, pulse 76, temperature 97.8 F (36.6 C), temperature source Oral, resp. rate 18, height _0  (1.651 m), weight 133 lb 4.8 oz (60.5 kg),  SpO2 98 %. Physical Exam  Constitutional: He is oriented to person, place, and time. He appears cachectic. He has a sickly appearance.  HENT:  Head: Normocephalic.  Cardiovascular: Normal rate.   Pulmonary/Chest: Effort normal and breath sounds normal.  Abdominal: Soft. He exhibits distension. There is tenderness. There is no rebound and no guarding. A hernia is present. Hernia confirmed positive in the ventral area.  Reducible ventral hernia, soft   Musculoskeletal: He exhibits no edema.  Neurological: He is alert and oriented to person, place, and time.  Skin: Skin is warm and dry.  Psychiatric: Mood, memory, affect and judgment normal.    Results: Results for orders placed or performed during the hospital encounter of 07/10/17 (from the past 48 hour(s))  CBC with Differential     Status: None   Collection Time: 07/10/17 10:19 PM  Result Value Ref Range   WBC 8.3 4.0 - 10.5 K/uL   RBC 4.52 4.22 - 5.81 MIL/uL   Hemoglobin 14.0 13.0 - 17.0 g/dL   HCT 42.6 39.0 - 52.0 %   MCV 94.2 78.0 - 100.0 fL   MCH 31.0 26.0 - 34.0 pg   MCHC 32.9 30.0 - 36.0 g/dL   RDW 13.6 11.5 - 15.5 %   Platelets 235 150 - 400 K/uL   Neutrophils Relative % 70 %   Neutro Abs 5.8 1.7 - 7.7 K/uL   Lymphocytes  Relative 21 %   Lymphs Abs 1.8 0.7 - 4.0 K/uL   Monocytes Relative 6 %   Monocytes Absolute 0.5 0.1 - 1.0 K/uL   Eosinophils Relative 3 %   Eosinophils Absolute 0.3 0.0 - 0.7 K/uL   Basophils Relative 0 %   Basophils Absolute 0.0 0.0 - 0.1 K/uL  Comprehensive metabolic panel     Status: Abnormal   Collection Time: 07/10/17 10:19 PM  Result Value Ref Range   Sodium 139 135 - 145 mmol/L   Potassium 4.8 3.5 - 5.1 mmol/L   Chloride 101 101 - 111 mmol/L   CO2 27 22 - 32 mmol/L   Glucose, Bld 122 (H) 65 - 99 mg/dL   BUN 19 6 - 20 mg/dL   Creatinine, Ser 1.69 (H) 0.61 - 1.24 mg/dL   Calcium 9.5 8.9 - 10.3 mg/dL   Total Protein 7.6 6.5 - 8.1 g/dL   Albumin 3.7 3.5 - 5.0 g/dL   AST 61 (H) 15 - 41 U/L   ALT 61 17 - 63 U/L   Alkaline Phosphatase 148 (H) 38 - 126 U/L   Total Bilirubin 1.1 0.3 - 1.2 mg/dL   GFR calc non Af Amer 38 (L) >60 mL/min   GFR calc Af Amer 44 (L) >60 mL/min    Comment: (NOTE) The eGFR has been calculated using the CKD EPI equation. This calculation has not been validated in all clinical situations. eGFR's persistently <60 mL/min signify possible Chronic Kidney Disease.    Anion gap 11 5 - 15  Blood culture (routine x 2)     Status: None (Preliminary result)   Collection Time: 07/11/17 12:30 AM  Result Value Ref Range   Specimen Description RIGHT ANTECUBITAL    Special Requests      BOTTLES DRAWN AEROBIC AND ANAEROBIC Blood Culture adequate volume   Culture PENDING    Report Status PENDING   Urinalysis, Routine w reflex microscopic     Status: Abnormal   Collection Time: 07/11/17 12:45 AM  Result Value Ref Range   Color, Urine YELLOW YELLOW  APPearance CLEAR CLEAR   Specific Gravity, Urine 1.013 1.005 - 1.030   pH 6.0 5.0 - 8.0   Glucose, UA NEGATIVE NEGATIVE mg/dL   Hgb urine dipstick SMALL (A) NEGATIVE   Bilirubin Urine NEGATIVE NEGATIVE   Ketones, ur 5 (A) NEGATIVE mg/dL   Protein, ur 30 (A) NEGATIVE mg/dL   Nitrite POSITIVE (A) NEGATIVE    Leukocytes, UA MODERATE (A) NEGATIVE   RBC / HPF 0-5 0 - 5 RBC/hpf   WBC, UA 6-30 0 - 5 WBC/hpf   Bacteria, UA NONE SEEN NONE SEEN   Squamous Epithelial / LPF 0-5 (A) NONE SEEN   Mucus PRESENT   Blood culture (routine x 2)     Status: None (Preliminary result)   Collection Time: 07/11/17 12:45 AM  Result Value Ref Range   Specimen Description BLOOD RIGHT ARM    Special Requests      BOTTLES DRAWN AEROBIC ONLY Blood Culture adequate volume   Culture PENDING    Report Status PENDING   I-Stat CG4 Lactic Acid, ED     Status: None   Collection Time: 07/11/17 12:54 AM  Result Value Ref Range   Lactic Acid, Venous 1.52 0.5 - 1.9 mmol/L  Comprehensive metabolic panel     Status: Abnormal   Collection Time: 07/11/17  7:42 AM  Result Value Ref Range   Sodium 135 135 - 145 mmol/L   Potassium 4.5 3.5 - 5.1 mmol/L   Chloride 99 (L) 101 - 111 mmol/L   CO2 26 22 - 32 mmol/L   Glucose, Bld 122 (H) 65 - 99 mg/dL   BUN 15 6 - 20 mg/dL   Creatinine, Ser 1.41 (H) 0.61 - 1.24 mg/dL   Calcium 8.3 (L) 8.9 - 10.3 mg/dL   Total Protein 6.8 6.5 - 8.1 g/dL   Albumin 3.3 (L) 3.5 - 5.0 g/dL   AST 34 15 - 41 U/L   ALT 44 17 - 63 U/L   Alkaline Phosphatase 132 (H) 38 - 126 U/L   Total Bilirubin 0.7 0.3 - 1.2 mg/dL   GFR calc non Af Amer 48 (L) >60 mL/min   GFR calc Af Amer 55 (L) >60 mL/min    Comment: (NOTE) The eGFR has been calculated using the CKD EPI equation. This calculation has not been validated in all clinical situations. eGFR's persistently <60 mL/min signify possible Chronic Kidney Disease.    Anion gap 10 5 - 15  CBC WITH DIFFERENTIAL     Status: None   Collection Time: 07/11/17  7:42 AM  Result Value Ref Range   WBC 7.3 4.0 - 10.5 K/uL   RBC 4.48 4.22 - 5.81 MIL/uL   Hemoglobin 13.7 13.0 - 17.0 g/dL   HCT 42.5 39.0 - 52.0 %   MCV 94.9 78.0 - 100.0 fL   MCH 30.6 26.0 - 34.0 pg   MCHC 32.2 30.0 - 36.0 g/dL   RDW 13.5 11.5 - 15.5 %   Platelets 235 150 - 400 K/uL   Neutrophils  Relative % 69 %   Neutro Abs 5.1 1.7 - 7.7 K/uL   Lymphocytes Relative 23 %   Lymphs Abs 1.7 0.7 - 4.0 K/uL   Monocytes Relative 5 %   Monocytes Absolute 0.4 0.1 - 1.0 K/uL   Eosinophils Relative 3 %   Eosinophils Absolute 0.2 0.0 - 0.7 K/uL   Basophils Relative 0 %   Basophils Absolute 0.0 0.0 - 0.1 K/uL  Lipase, blood     Status: None  Collection Time: 07/11/17  7:42 AM  Result Value Ref Range   Lipase 20 11 - 51 U/L   Personally reviewed imaging- CT with dilated SB at hernia with fecalization, no obvious transition point, stool in colon   Dg Chest 2 View  Result Date: 07/11/2017 CLINICAL DATA:  Dysuria today. Recent antibiotic treatment for urinary tract infection. Fever. EXAM: CHEST  2 VIEW COMPARISON:  06/14/2017 FINDINGS: Emphysematous disease and chronic appearing interstitial coarsening persist without significant interval change. No confluent airspace consolidation. No effusions. Normal heart size. Unremarkable hilar and mediastinal contours. Normal pulmonary vasculature. IMPRESSION: Chronic emphysematous and fibrotic changes. No airspace consolidation. No effusion. Electronically Signed   By: Andreas Newport M.D.   On: 07/11/2017 01:30   Ct Renal Stone Study  Result Date: 07/11/2017 CLINICAL DATA:  Acute onset of dysuria and difficulty urinating. Initial encounter. EXAM: CT ABDOMEN AND PELVIS WITHOUT CONTRAST TECHNIQUE: Multidetector CT imaging of the abdomen and pelvis was performed following the standard protocol without IV contrast. COMPARISON:  CT of the abdomen and pelvis performed 06/14/2017 FINDINGS: Lower chest: Diffuse coronary artery calcifications are seen. Scarring and nodularity are noted at the right lung base. Hepatobiliary: The liver is unremarkable in appearance. The patient is status post cholecystectomy, with clips noted at the gallbladder fossa. There is dilatation of the common bile duct to 1.5 cm, with an apparent obstructing 1.4 cm stone at the level of  the pancreatic head. Trace ascites is noted tracking about the liver. Pancreas: The pancreas is within normal limits. Spleen: The spleen is unremarkable in appearance. Adrenals/Urinary Tract: The adrenal glands are unremarkable in appearance. There is severe right renal atrophy. Minimal left-sided hydronephrosis is noted. There is diffuse Purves thickening at the left renal pelvis and proximal left ureter, raising question for left-sided ureteritis. Bilateral perinephric stranding is noted. Bilateral renal cysts are seen. A nonobstructing 6 mm stone is noted at the lower pole of the right kidney. Stomach/Bowel: The stomach is unremarkable in appearance. There is dilatation of small-bowel loops up to 4.9 cm in maximal diameter, with mild fecalization, just proximal to a relatively large anterior abdominal Luten hernia. There is herniation of multiple small bowel loops into the hernia. Mild associated mesenteric edema is noted. This is mildly worsened from September, and concerning for partial small bowel obstruction or significant dysmotility secondary to the hernia. Small-bowel loops within the pelvis demonstrate mild diffuse Krakowski thickening, concerning for ileitis. This is new from September. A small amount of free fluid is noted within the pelvis. The patient is status post appendectomy. Postoperative change is noted at the proximal sigmoid colon. The remaining colon is unremarkable in appearance. Vascular/Lymphatic: Diffuse calcification is seen along the abdominal aorta and its branches. There is marked narrowing of the distal abdominal aorta, raising concern for severe luminal narrowing or occlusion. There is associated diffuse narrowing of the common, internal and external iliac arteries. The inferior vena cava is grossly unremarkable. No retroperitoneal lymphadenopathy is seen. No pelvic sidewall lymphadenopathy is identified. Reproductive: The bladder is relatively decompressed. A suprapubic catheter is noted.  Bladder Katen thickening could reflect cystitis. The prostate is normal in size. Other: No additional soft tissue abnormalities are seen. Musculoskeletal: No acute osseous abnormalities are identified. Vacuum phenomenon is noted at L5-S1. The visualized musculature is unremarkable in appearance. IMPRESSION: 1. Bladder Hyder thickening could reflect cystitis. 2. Small-bowel loops within the pelvis demonstrate mild diffuse Kina thickening, concerning for ileitis. This is new from September. 3. Dilatation of small-bowel loops up to  4.9 cm in maximal diameter, with mild fecalization, just proximal to a relatively large anterior abdominal Meiklejohn hernia. Herniation of multiple small bowel loops into the hernia. Mild associated mesenteric edema. This is mildly worsened from September, and concerning for partial small bowel obstruction or significant dysmotility secondary to the hernia. 4. Diffuse Niziolek thickening at the left renal pelvis and proximal left ureter, raising concern for left-sided ureteritis. Minimal left-sided hydronephrosis noted. 5. Distention of the common bile duct to 1.5 cm. Apparent obstructing 1.4 cm stone at the level of the pancreatic head. This stone has progressed distally since September. 6. Trace ascites within the abdomen and pelvis. 7. Diffuse coronary artery calcifications. 8. Severe right renal atrophy. A 6 mm nonobstructing stone at the lower pole of the right kidney. 9. Diffuse aortic atherosclerosis. Marked narrowing of the distal abdominal aorta, raising concern for severe luminal narrowing or occlusion. Associated diffuse narrowing of the common, internal and external iliac arteries. Electronically Signed   By: Garald Balding M.D.   On: 07/11/2017 01:53     Assessment & Plan:  DANIIL LABARGE is a 76 y.o. male with likely some degree of pSBO that has some degree of chronicity due to the fecalization and likely some degree of ileus on top of this due to his UTI.  Patient has been refusing NG  tube initially.  -Recommend NG if continues to have nausea/vomiting, think he will likely allow RN to do shortly -NPO -IV fluids, correct electrolytes, K 4, Phos 3, Mg 2 -Will follow, no acute surgical intervention indicated  All questions were answered to the satisfaction of the patient and family.   Virl Cagey 07/11/2017, 11:09 AM

## 2017-07-11 NOTE — Progress Notes (Signed)
NG tube removed per MD, tolerated well. Placement of new NG tube, patient tolerated well. KUB ordered for placement per MD.

## 2017-07-12 NOTE — Progress Notes (Signed)
Subjective: He says he feels better. He has no new complaints. He had a large bowel movement yesterday and now is having some diarrhea. No other new complaints. His abdominal pain has improved.  Objective: Vital signs in last 24 hours: Temp:  [97.8 F (36.6 C)-98.8 F (37.1 C)] 98.2 F (36.8 C) (10/14 0800) Pulse Rate:  [63-88] 63 (10/14 0800) Resp:  [18] 18 (10/14 0406) BP: (117-148)/(59-101) 148/74 (10/14 0800) SpO2:  [91 %-96 %] 96 % (10/14 0800) Weight:  [59.4 kg (131 lb)] 59.4 kg (131 lb) (10/14 0452) Weight change: -1.814 kg (-4 lb) Last BM Date: 07/10/17  Intake/Output from previous day: 10/13 0701 - 10/14 0700 In: 100 [IV Piggyback:100] Out: 700 [Urine:700]  PHYSICAL EXAM General appearance: alert, cooperative and mild distress Resp: clear to auscultation bilaterally Cardio: regular rate and rhythm, S1, S2 normal, no murmur, click, rub or gallop GI: His abdominal exam is much improved Extremities: extremities normal, atraumatic, no cyanosis or edema Skin warm and dry.  Lab Results:  Results for orders placed or performed during the hospital encounter of 07/10/17 (from the past 48 hour(s))  CBC with Differential     Status: None   Collection Time: 07/10/17 10:19 PM  Result Value Ref Range   WBC 8.3 4.0 - 10.5 K/uL   RBC 4.52 4.22 - 5.81 MIL/uL   Hemoglobin 14.0 13.0 - 17.0 g/dL   HCT 42.6 39.0 - 52.0 %   MCV 94.2 78.0 - 100.0 fL   MCH 31.0 26.0 - 34.0 pg   MCHC 32.9 30.0 - 36.0 g/dL   RDW 13.6 11.5 - 15.5 %   Platelets 235 150 - 400 K/uL   Neutrophils Relative % 70 %   Neutro Abs 5.8 1.7 - 7.7 K/uL   Lymphocytes Relative 21 %   Lymphs Abs 1.8 0.7 - 4.0 K/uL   Monocytes Relative 6 %   Monocytes Absolute 0.5 0.1 - 1.0 K/uL   Eosinophils Relative 3 %   Eosinophils Absolute 0.3 0.0 - 0.7 K/uL   Basophils Relative 0 %   Basophils Absolute 0.0 0.0 - 0.1 K/uL  Comprehensive metabolic panel     Status: Abnormal   Collection Time: 07/10/17 10:19 PM  Result Value  Ref Range   Sodium 139 135 - 145 mmol/L   Potassium 4.8 3.5 - 5.1 mmol/L   Chloride 101 101 - 111 mmol/L   CO2 27 22 - 32 mmol/L   Glucose, Bld 122 (H) 65 - 99 mg/dL   BUN 19 6 - 20 mg/dL   Creatinine, Ser 1.69 (H) 0.61 - 1.24 mg/dL   Calcium 9.5 8.9 - 10.3 mg/dL   Total Protein 7.6 6.5 - 8.1 g/dL   Albumin 3.7 3.5 - 5.0 g/dL   AST 61 (H) 15 - 41 U/L   ALT 61 17 - 63 U/L   Alkaline Phosphatase 148 (H) 38 - 126 U/L   Total Bilirubin 1.1 0.3 - 1.2 mg/dL   GFR calc non Af Amer 38 (L) >60 mL/min   GFR calc Af Amer 44 (L) >60 mL/min    Comment: (NOTE) The eGFR has been calculated using the CKD EPI equation. This calculation has not been validated in all clinical situations. eGFR's persistently <60 mL/min signify possible Chronic Kidney Disease.    Anion gap 11 5 - 15  Blood culture (routine x 2)     Status: None (Preliminary result)   Collection Time: 07/11/17 12:30 AM  Result Value Ref Range   Specimen Description RIGHT  ANTECUBITAL    Special Requests      BOTTLES DRAWN AEROBIC AND ANAEROBIC Blood Culture adequate volume   Culture NO GROWTH < 12 HOURS    Report Status PENDING   Urinalysis, Routine w reflex microscopic     Status: Abnormal   Collection Time: 07/11/17 12:45 AM  Result Value Ref Range   Color, Urine YELLOW YELLOW   APPearance CLEAR CLEAR   Specific Gravity, Urine 1.013 1.005 - 1.030   pH 6.0 5.0 - 8.0   Glucose, UA NEGATIVE NEGATIVE mg/dL   Hgb urine dipstick SMALL (A) NEGATIVE   Bilirubin Urine NEGATIVE NEGATIVE   Ketones, ur 5 (A) NEGATIVE mg/dL   Protein, ur 30 (A) NEGATIVE mg/dL   Nitrite POSITIVE (A) NEGATIVE   Leukocytes, UA MODERATE (A) NEGATIVE   RBC / HPF 0-5 0 - 5 RBC/hpf   WBC, UA 6-30 0 - 5 WBC/hpf   Bacteria, UA NONE SEEN NONE SEEN   Squamous Epithelial / LPF 0-5 (A) NONE SEEN   Mucus PRESENT   Blood culture (routine x 2)     Status: None (Preliminary result)   Collection Time: 07/11/17 12:45 AM  Result Value Ref Range   Specimen  Description BLOOD RIGHT ARM    Special Requests      BOTTLES DRAWN AEROBIC ONLY Blood Culture adequate volume   Culture NO GROWTH < 12 HOURS    Report Status PENDING   I-Stat CG4 Lactic Acid, ED     Status: None   Collection Time: 07/11/17 12:54 AM  Result Value Ref Range   Lactic Acid, Venous 1.52 0.5 - 1.9 mmol/L  Comprehensive metabolic panel     Status: Abnormal   Collection Time: 07/11/17  7:42 AM  Result Value Ref Range   Sodium 135 135 - 145 mmol/L   Potassium 4.5 3.5 - 5.1 mmol/L   Chloride 99 (L) 101 - 111 mmol/L   CO2 26 22 - 32 mmol/L   Glucose, Bld 122 (H) 65 - 99 mg/dL   BUN 15 6 - 20 mg/dL   Creatinine, Ser 1.41 (H) 0.61 - 1.24 mg/dL   Calcium 8.3 (L) 8.9 - 10.3 mg/dL   Total Protein 6.8 6.5 - 8.1 g/dL   Albumin 3.3 (L) 3.5 - 5.0 g/dL   AST 34 15 - 41 U/L   ALT 44 17 - 63 U/L   Alkaline Phosphatase 132 (H) 38 - 126 U/L   Total Bilirubin 0.7 0.3 - 1.2 mg/dL   GFR calc non Af Amer 48 (L) >60 mL/min   GFR calc Af Amer 55 (L) >60 mL/min    Comment: (NOTE) The eGFR has been calculated using the CKD EPI equation. This calculation has not been validated in all clinical situations. eGFR's persistently <60 mL/min signify possible Chronic Kidney Disease.    Anion gap 10 5 - 15  CBC WITH DIFFERENTIAL     Status: None   Collection Time: 07/11/17  7:42 AM  Result Value Ref Range   WBC 7.3 4.0 - 10.5 K/uL   RBC 4.48 4.22 - 5.81 MIL/uL   Hemoglobin 13.7 13.0 - 17.0 g/dL   HCT 42.5 39.0 - 52.0 %   MCV 94.9 78.0 - 100.0 fL   MCH 30.6 26.0 - 34.0 pg   MCHC 32.2 30.0 - 36.0 g/dL   RDW 13.5 11.5 - 15.5 %   Platelets 235 150 - 400 K/uL   Neutrophils Relative % 69 %   Neutro Abs 5.1 1.7 - 7.7 K/uL  Lymphocytes Relative 23 %   Lymphs Abs 1.7 0.7 - 4.0 K/uL   Monocytes Relative 5 %   Monocytes Absolute 0.4 0.1 - 1.0 K/uL   Eosinophils Relative 3 %   Eosinophils Absolute 0.2 0.0 - 0.7 K/uL   Basophils Relative 0 %   Basophils Absolute 0.0 0.0 - 0.1 K/uL  Lipase, blood      Status: None   Collection Time: 07/11/17  7:42 AM  Result Value Ref Range   Lipase 20 11 - 51 U/L    ABGS No results for input(s): PHART, PO2ART, TCO2, HCO3 in the last 72 hours.  Invalid input(s): PCO2 CULTURES Recent Results (from the past 240 hour(s))  Blood culture (routine x 2)     Status: None (Preliminary result)   Collection Time: 07/11/17 12:30 AM  Result Value Ref Range Status   Specimen Description RIGHT ANTECUBITAL  Final   Special Requests   Final    BOTTLES DRAWN AEROBIC AND ANAEROBIC Blood Culture adequate volume   Culture NO GROWTH < 12 HOURS  Final   Report Status PENDING  Incomplete  Blood culture (routine x 2)     Status: None (Preliminary result)   Collection Time: 07/11/17 12:45 AM  Result Value Ref Range Status   Specimen Description BLOOD RIGHT ARM  Final   Special Requests   Final    BOTTLES DRAWN AEROBIC ONLY Blood Culture adequate volume   Culture NO GROWTH < 12 HOURS  Final   Report Status PENDING  Incomplete   Studies/Results: Dg Chest 1 View  Result Date: 07/11/2017 CLINICAL DATA:  NG tube placement. EXAM: CHEST 1 VIEW COMPARISON:  07/11/2017 FINDINGS: Enteric catheter tip overlies the central lower thorax. Cardiomediastinal silhouette is normal. Calcific atherosclerotic disease and tortuosity of the aorta. There is no evidence of pneumothorax. Bilateral, left greater than right, lower thorax airspace consolidation. Osseous structures are without acute abnormality. Soft tissues are grossly normal. IMPRESSION: Enteric catheter overlies the central lower thorax. Interval development of bilateral lower lungs airspace consolidation, left greater than right. Electronically Signed   By: Fidela Salisbury M.D.   On: 07/11/2017 12:29   Dg Chest 2 View  Result Date: 07/11/2017 CLINICAL DATA:  Dysuria today. Recent antibiotic treatment for urinary tract infection. Fever. EXAM: CHEST  2 VIEW COMPARISON:  06/14/2017 FINDINGS: Emphysematous disease and  chronic appearing interstitial coarsening persist without significant interval change. No confluent airspace consolidation. No effusions. Normal heart size. Unremarkable hilar and mediastinal contours. Normal pulmonary vasculature. IMPRESSION: Chronic emphysematous and fibrotic changes. No airspace consolidation. No effusion. Electronically Signed   By: Andreas Newport M.D.   On: 07/11/2017 01:30   Dg Abd 1 View  Result Date: 07/11/2017 CLINICAL DATA:  Status post NG tube placement. EXAM: ABDOMEN - 1 VIEW COMPARISON:  CT abdomen pelvis 07/11/2017 FINDINGS: NG tube is in place with the tip and side-port in the stomach. No acute abnormality. IMPRESSION: NG tube in good position. Electronically Signed   By: Inge Rise M.D.   On: 07/11/2017 13:29   Ct Renal Stone Study  Result Date: 07/11/2017 CLINICAL DATA:  Acute onset of dysuria and difficulty urinating. Initial encounter. EXAM: CT ABDOMEN AND PELVIS WITHOUT CONTRAST TECHNIQUE: Multidetector CT imaging of the abdomen and pelvis was performed following the standard protocol without IV contrast. COMPARISON:  CT of the abdomen and pelvis performed 06/14/2017 FINDINGS: Lower chest: Diffuse coronary artery calcifications are seen. Scarring and nodularity are noted at the right lung base. Hepatobiliary: The liver is unremarkable in  appearance. The patient is status post cholecystectomy, with clips noted at the gallbladder fossa. There is dilatation of the common bile duct to 1.5 cm, with an apparent obstructing 1.4 cm stone at the level of the pancreatic head. Trace ascites is noted tracking about the liver. Pancreas: The pancreas is within normal limits. Spleen: The spleen is unremarkable in appearance. Adrenals/Urinary Tract: The adrenal glands are unremarkable in appearance. There is severe right renal atrophy. Minimal left-sided hydronephrosis is noted. There is diffuse [place] thickening at the left renal pelvis and proximal left ureter, raising  question for left-sided ureteritis. Bilateral perinephric stranding is noted. Bilateral renal cysts are seen. A nonobstructing 6 mm stone is noted at the lower pole of the right kidney. Stomach/Bowel: The stomach is unremarkable in appearance. There is dilatation of small-bowel loops up to 4.9 cm in maximal diameter, with mild fecalization, just proximal to a relatively large anterior abdominal [place] hernia. There is herniation of multiple small bowel loops into the hernia. Mild associated mesenteric edema is noted. This is mildly worsened from September, and concerning for partial small bowel obstruction or significant dysmotility secondary to the hernia. Small-bowel loops within the pelvis demonstrate mild diffuse Zylstra thickening, concerning for ileitis. This is new from September. A small amount of free fluid is noted within the pelvis. The patient is status post appendectomy. Postoperative change is noted at the proximal sigmoid colon. The remaining colon is unremarkable in appearance. Vascular/Lymphatic: Diffuse calcification is seen along the abdominal aorta and its branches. There is marked narrowing of the distal abdominal aorta, raising concern for severe luminal narrowing or occlusion. There is associated diffuse narrowing of the common, internal and external iliac arteries. The inferior vena cava is grossly unremarkable. No retroperitoneal lymphadenopathy is seen. No pelvic sidewall lymphadenopathy is identified. Reproductive: The bladder is relatively decompressed. A suprapubic catheter is noted. Bladder [place] thickening could reflect cystitis. The prostate is normal in size. Other: No additional soft tissue abnormalities are seen. Musculoskeletal: No acute osseous abnormalities are identified. Vacuum phenomenon is noted at L5-S1. The visualized musculature is unremarkable in appearance. IMPRESSION: 1. Bladder [place] thickening could reflect cystitis. 2. Small-bowel loops within the pelvis demonstrate mild  diffuse Mckee thickening, concerning for ileitis. This is new from September. 3. Dilatation of small-bowel loops up to 4.9 cm in maximal diameter, with mild fecalization, just proximal to a relatively large anterior abdominal Dhillon hernia. Herniation of multiple small bowel loops into the hernia. Mild associated mesenteric edema. This is mildly worsened from September, and concerning for partial small bowel obstruction or significant dysmotility secondary to the hernia. 4. Diffuse [place] thickening at the left renal pelvis and proximal left ureter, raising concern for left-sided ureteritis. Minimal left-sided hydronephrosis noted. 5. Distention of the common bile duct to 1.5 cm. Apparent obstructing 1.4 cm stone at the level of the pancreatic head. This stone has progressed distally since September. 6. Trace ascites within the abdomen and pelvis. 7. Diffuse coronary artery calcifications. 8. Severe right renal atrophy. A 6 mm nonobstructing stone at the lower pole of the right kidney. 9. Diffuse aortic atherosclerosis. Marked narrowing of the distal abdominal aorta, raising concern for severe luminal narrowing or occlusion. Associated diffuse narrowing of the common, internal and external iliac arteries. Electronically Signed   By: Garald Balding M.D.   On: 07/11/2017 01:53    Medications:  Prior to Admission:  Prescriptions Prior to Admission  Medication Sig Dispense Refill Last Dose  . carvedilol (COREG) 6.25 MG tablet Take 1 tablet (  6.25 mg total) by mouth 2 (two) times daily with a meal.   07/10/2017 at 800  . diazepam (VALIUM) 5 MG tablet TAKE 1 TABLET BY MOUTH TWICE A DAY 60 tablet 4 07/10/2017 at Unknown time  . HYDROcodone-acetaminophen (NORCO) 7.5-325 MG per tablet Take 1 tablet by mouth 2 (two) times daily. Max APAP 3 GM IN 24 HOURS FROM ALL SOURCES (Patient taking differently: Take 1 tablet by mouth 2 (two) times daily. May take up to 4 times daily as needed for pain) 60 tablet 0 07/10/2017 at  Unknown time  . ibuprofen (ADVIL,MOTRIN) 200 MG tablet Take 200 mg by mouth every 6 (six) hours as needed for fever.   unknown  . levothyroxine (SYNTHROID, LEVOTHROID) 88 MCG tablet Take 88 mcg by mouth daily before breakfast.   07/10/2017 at Unknown time  . potassium chloride SA (K-DUR,KLOR-CON) 20 MEQ tablet Take 20 mEq by mouth daily.   07/10/2017 at Unknown time  . solifenacin (VESICARE) 10 MG tablet Take 10 mg by mouth daily as needed (for spasms).    unknown  . verapamil (CALAN) 40 MG tablet Take 40 mg by mouth 2 (two) times daily.   07/10/2017 at Unknown time  . predniSONE (STERAPRED UNI-PAK 21 TAB) 10 MG (21) TBPK tablet Take by package instructions (Patient not taking: Reported on 07/11/2017) 21 tablet 0 Completed Course at Unknown time   Scheduled: . heparin  5,000 Units Subcutaneous Q8H  . levothyroxine  50 mcg Intravenous QAC breakfast  . metoprolol tartrate  2.5 mg Intravenous Q12H   Continuous: . meropenem (MERREM) IV 1 g (07/12/17 0825)  . ondansetron (ZOFRAN) IV     QMG:[id number] **OR** acetaminophen, fentaNYL (SUBLIMAZE) injection, hydrALAZINE, LORazepam, ondansetron **OR** ondansetron (ZOFRAN) IV  Assesment: He was admitted with urinary tract infection and small bowel obstruction. He is doing better. He still has nasogastric tube in place and Dr. Constance Haw from surgery has been following with me. I will hold on pulling the NG tube until she has reassessed. He says he does feel better. He is still having some discomfort in his bladder area. His breathing is doing okay. He has not had any cardiac arrhythmias. Principal Problem:   UTI (urinary tract infection) Active Problems:   Multiple sclerosis (HCC)   HTN (hypertension), malignant   Chronic suprapubic catheter (HCC)   Hypothyroidism   CKD (chronic kidney disease), stage III (HCC)   Paroxysmal atrial tachycardia (HCC)   Partial small bowel obstruction ([place])   Choledocholithiasis    Plan: Continue current  treatments    LOS: 1 day   Tijuan Dantes L 07/12/2017, 9:55 AM

## 2017-07-12 NOTE — Progress Notes (Signed)
[facility] Progress Note     Subjective: Patient feeling much better. Had multiple BMs and abdomen soft, NG with only 300 recorded out.   Objective: Vital signs in last 24 hours: Temp:  [98 F (36.7 C)-98.8 F (37.1 C)] 98.2 F (36.8 C) (10/14 0800) Pulse Rate:  [63-88] 63 (10/14 0800) Resp:  [18] 18 (10/14 0406) BP: (117-148)/(59-74) 148/74 (10/14 0800) SpO2:  [91 %-96 %] 96 % (10/14 0800) Weight:  [131 lb (59.4 kg)] 131 lb (59.4 kg) (10/14 0452) Last BM Date: 07/10/17  Intake/Output from previous day: 10/13 0701 - 10/14 0700 In: 100 [IV Piggyback:100] Out: 700 [Urine:700] Intake/Output this shift: Total I/O In: 0  Out: 600 [Urine:300; Emesis/NG output:300]  General appearance: alert, cooperative and no distress Resp: normal work breathing GI: soft, nontender, minimally distended, hernia defect reducible  Lab Results:   Recent Labs  07/10/17 2219 07/11/17 0742  WBC 8.3 7.3  HGB 14.0 13.7  HCT 42.6 42.5  PLT 235 235   BMET  Recent Labs  07/10/17 2219 07/11/17 0742  NA 139 135  K 4.8 4.5  CL 101 99*  CO2 27 26  GLUCOSE 122* 122*  BUN 19 15  CREATININE 1.69* 1.41*  CALCIUM 9.5 8.3*   PT/INR No results for input(s): LABPROT, INR in the last 72 hours.  Studies/Results: Dg Chest 1 View  Result Date: 07/11/2017 CLINICAL DATA:  NG tube placement. EXAM: CHEST 1 VIEW COMPARISON:  07/11/2017 FINDINGS: Enteric catheter tip overlies the central lower thorax. Cardiomediastinal silhouette is normal. Calcific atherosclerotic disease and tortuosity of the aorta. There is no evidence of pneumothorax. Bilateral, left greater than right, lower thorax airspace consolidation. Osseous structures are without acute abnormality. Soft tissues are grossly normal. IMPRESSION: Enteric catheter overlies the central lower thorax. Interval development of bilateral lower lungs airspace consolidation, left greater than right. Electronically Signed   By: Fidela Salisbury M.D.   On: 07/11/2017 12:29   Dg Chest 2 View  Result Date: 07/11/2017 CLINICAL DATA:  Dysuria today. Recent antibiotic treatment for urinary tract infection. Fever. EXAM: CHEST  2 VIEW COMPARISON:  06/14/2017 FINDINGS: Emphysematous disease and chronic appearing interstitial coarsening persist without significant interval change. No confluent airspace consolidation. No effusions. Normal heart size. Unremarkable hilar and mediastinal contours. Normal pulmonary vasculature. IMPRESSION: Chronic emphysematous and fibrotic changes. No airspace consolidation. No effusion. Electronically Signed   By: Andreas Newport M.D.   On: 07/11/2017 01:30   Dg Abd 1 View  Result Date: 07/11/2017 CLINICAL DATA:  Status post NG tube placement. EXAM: ABDOMEN - 1 VIEW COMPARISON:  CT abdomen pelvis 07/11/2017 FINDINGS: NG tube is in place with the tip and side-port in the stomach. No acute abnormality. IMPRESSION: NG tube in good position. Electronically Signed   By: Inge Rise M.D.   On: 07/11/2017 13:29   Ct Renal Stone Study  Result Date: 07/11/2017 CLINICAL DATA:  Acute onset of dysuria and difficulty urinating. Initial encounter. EXAM: CT ABDOMEN AND PELVIS WITHOUT CONTRAST TECHNIQUE: Multidetector CT imaging of the abdomen and pelvis was performed following the standard protocol without IV contrast. COMPARISON:  CT of the abdomen and pelvis performed 06/14/2017 FINDINGS: Lower chest: Diffuse coronary artery calcifications are seen. Scarring and nodularity are noted at the right lung base. Hepatobiliary: The liver is unremarkable in appearance. The patient is status post cholecystectomy, with clips noted at the gallbladder fossa. There is dilatation of the common bile duct to 1.5 cm, with an apparent obstructing 1.4 cm stone at  the level of the pancreatic head. Trace ascites is noted tracking about the liver. Pancreas: The pancreas is within normal limits. Spleen: The spleen is unremarkable in  appearance. Adrenals/Urinary Tract: The adrenal glands are unremarkable in appearance. There is severe right renal atrophy. Minimal left-sided hydronephrosis is noted. There is diffuse Collier thickening at the left renal pelvis and proximal left ureter, raising question for left-sided ureteritis. Bilateral perinephric stranding is noted. Bilateral renal cysts are seen. A nonobstructing 6 mm stone is noted at the lower pole of the right kidney. Stomach/Bowel: The stomach is unremarkable in appearance. There is dilatation of small-bowel loops up to 4.9 cm in maximal diameter, with mild fecalization, just proximal to a relatively large anterior abdominal [place] hernia. There is herniation of multiple small bowel loops into the hernia. Mild associated mesenteric edema is noted. This is mildly worsened from September, and concerning for partial small bowel obstruction or significant dysmotility secondary to the hernia. Small-bowel loops within the pelvis demonstrate mild diffuse [place] thickening, concerning for ileitis. This is new from September. A small amount of free fluid is noted within the pelvis. The patient is status post appendectomy. Postoperative change is noted at the proximal sigmoid colon. The remaining colon is unremarkable in appearance. Vascular/Lymphatic: Diffuse calcification is seen along the abdominal aorta and its branches. There is marked narrowing of the distal abdominal aorta, raising concern for severe luminal narrowing or occlusion. There is associated diffuse narrowing of the common, internal and external iliac arteries. The inferior vena cava is grossly unremarkable. No retroperitoneal lymphadenopathy is seen. No pelvic sidewall lymphadenopathy is identified. Reproductive: The bladder is relatively decompressed. A suprapubic catheter is noted. Bladder [place] thickening could reflect cystitis. The prostate is normal in size. Other: No additional soft tissue abnormalities are seen. Musculoskeletal: No  acute osseous abnormalities are identified. Vacuum phenomenon is noted at L5-S1. The visualized musculature is unremarkable in appearance. IMPRESSION: 1. Bladder [place] thickening could reflect cystitis. 2. Small-bowel loops within the pelvis demonstrate mild diffuse [place] thickening, concerning for ileitis. This is new from September. 3. Dilatation of small-bowel loops up to 4.9 cm in maximal diameter, with mild fecalization, just proximal to a relatively large anterior abdominal Laurie hernia. Herniation of multiple small bowel loops into the hernia. Mild associated mesenteric edema. This is mildly worsened from September, and concerning for partial small bowel obstruction or significant dysmotility secondary to the hernia. 4. Diffuse [place] thickening at the left renal pelvis and proximal left ureter, raising concern for left-sided ureteritis. Minimal left-sided hydronephrosis noted. 5. Distention of the common bile duct to 1.5 cm. Apparent obstructing 1.4 cm stone at the level of the pancreatic head. This stone has progressed distally since September. 6. Trace ascites within the abdomen and pelvis. 7. Diffuse coronary artery calcifications. 8. Severe right renal atrophy. A 6 mm nonobstructing stone at the lower pole of the right kidney. 9. Diffuse aortic atherosclerosis. Marked narrowing of the distal abdominal aorta, raising concern for severe luminal narrowing or occlusion. Associated diffuse narrowing of the common, internal and external iliac arteries. Electronically Signed   By: Garald Balding M.D.   On: 07/11/2017 01:53    Anti-infectives: Anti-infectives    Start     Dose/Rate Route Frequency Ordered Stop   07/11/17 1500  meropenem (MERREM) 1 g in sodium chloride 0.9 % 100 mL IVPB     1 g 200 mL/hr over 30 Minutes Intravenous Every 12 hours 07/11/17 0901     07/11/17 0330  meropenem (MERREM) 1 g  in sodium chloride 0.9 % 100 mL IVPB     1 g 200 mL/hr over 30 Minutes Intravenous  Once 07/11/17 0321  07/11/17 0520   07/11/17 0215  cefTRIAXone (ROCEPHIN) 1 g in dextrose 5 % 50 mL IVPB     1 g 100 mL/hr over 30 Minutes Intravenous  Once 07/11/17 0203 07/11/17 [id number]      Assessment/Plan: Jordan Ward is a 76 yo with pSBO/ ileus that is resolving. Doing well. Also being treated for UTI. -Remove NG and clears, can adv as tolerates -Discussed with Dr. Luan Pulling earlier today    LOS: 1 day    Jordan Ward 07/12/2017

## 2017-07-13 LAB — URINE CULTURE: Culture: >=100000 — AB

## 2017-07-13 MED ORDER — VERAPAMIL HCL 80 MG PO TABS
40.0000 mg | ORAL_TABLET | Freq: Two times a day (BID) | ORAL | Status: DC
  Administered 2017-07-13 – 2017-07-17 (×9): 40 mg via ORAL
  Filled 2017-07-13 (×10): qty 1

## 2017-07-13 MED ORDER — DARIFENACIN HYDROBROMIDE ER 7.5 MG PO TB24
15.0000 mg | ORAL_TABLET | Freq: Every day | ORAL | Status: DC
  Administered 2017-07-13 – 2017-07-17 (×5): 15 mg via ORAL
  Filled 2017-07-13 (×5): qty 2

## 2017-07-13 MED ORDER — LEVOTHYROXINE SODIUM 88 MCG PO TABS
88.0000 ug | ORAL_TABLET | Freq: Every day | ORAL | Status: DC
  Administered 2017-07-13 – 2017-07-17 (×5): 88 ug via ORAL
  Filled 2017-07-13 (×5): qty 1

## 2017-07-13 MED ORDER — LOPERAMIDE HCL 2 MG PO CAPS: 2.0000 mg | ORAL_CAPSULE | ORAL | Status: DC | PRN

## 2017-07-13 MED ORDER — DIAZEPAM 5 MG PO TABS
5.0000 mg | ORAL_TABLET | Freq: Two times a day (BID) | ORAL | Status: DC
  Administered 2017-07-13 – 2017-07-17 (×9): 5 mg via ORAL
  Filled 2017-07-13 (×10): qty 1

## 2017-07-13 MED ORDER — CARVEDILOL 3.125 MG PO TABS
6.2500 mg | ORAL_TABLET | Freq: Two times a day (BID) | ORAL | Status: DC
  Administered 2017-07-13 – 2017-07-17 (×9): 6.25 mg via ORAL
  Filled 2017-07-13 (×9): qty 2

## 2017-07-13 MED ORDER — HYDROCODONE-ACETAMINOPHEN 7.5-325 MG PO TABS
1.0000 | ORAL_TABLET | Freq: Four times a day (QID) | ORAL | Status: DC | PRN
  Administered 2017-07-13 – 2017-07-17 (×7): 1 via ORAL
  Filled 2017-07-13 (×7): qty 1

## 2017-07-13 NOTE — Evaluation (Signed)
Clinical/Bedside Swallow Evaluation Patient Details  Name: Jordan Ward MRN: [id number] Date of Birth: July 17, 1941  Today's Date: 07/13/2017 Time: SLP Start Time (ACUTE ONLY): 1200 SLP Stop Time (ACUTE ONLY): 1227 SLP Time Calculation (min) (ACUTE ONLY): 27 min  Past Medical History:  Past Medical History:  Diagnosis Date  . Anemia   . Arthritis   . Back pain, chronic   . Bilateral carotid bruits   . C. difficile colitis 09/2011  . CAD (coronary artery disease)   . Carotid artery stenosis   . Cerebrovascular disease   . Colon polyps   . Dyslipidemia   . Dysphagia 10/07/2011  . Encephalopathy   . Gait disorder   . HA (headache)   . High grade dysplasia in colonic adenoma 09/2005  . History of kidney stones   . HTN (hypertension), malignant 10/06/2011  . Hypernatremia   . Hypokalemia   . Hypothyroidism 10/08/2011  . Insomnia   . Junctional rhythm   . Kidney stones   . MS (multiple sclerosis) ([place])   . Neuromuscular disorder ([place])   . OSA (obstructive sleep apnea)   . Paroxysmal atrial tachycardia ([place])   . Peripheral vascular disease ([place])   . Pneumonia 4 yrs ago  . Pulmonary fibrosis ([place]) 10/06/2011  . Pulmonary nodule 10/08/2011  . PVD (peripheral vascular disease) ([place])   . Sacral ulcer ([place])   . Sleep apnea   . Stroke ([place])   . TIA (transient ischemic attack)   . Tremors of nervous system 10/08/2011  . Urinary tract infection    Past Surgical History:  Past Surgical History:  Procedure Laterality Date  . APPENDECTOMY  09/2005   at time of left hemicolectomy  . BACK SURGERY  1976/1979   lower  . CHOLECYSTECTOMY     Dr. Tamala Julian  . COLON SURGERY  09/2005   Fleishman: four tubular adenomas, large adenomatous polyp with HIGH GRADE dysplasia  . COLONOSCOPY  11/2004   Dr. Sharol Roussel sessile polyp splenic flexure, 96mm sessile polyp desc colon, tubulovillous adenoma (bx not removed)  . COLONOSCOPY  01/2005   poor prep, polyp could not be found  . COLONOSCOPY  05/2005   with  EMR, polypectomy Dr. Olegario Messier, bx showed high grade dysplasia, partially resected  . COLONOSCOPY  09/2005   Dr. Arsenio Loader, [place] ink tattooing, four villous colon polyp (3 had been missed on previous colonoscopies due to limitations of procedures  . COLONOSCOPY  09/2006   normal TI, no polyps  . COLONOSCOPY  10/2007   Dr. Imogene Burn distal mammillations, benign bx, normal TI, random bx neg for microscopic colitis  . CYSTOSCOPY WITH RETROGRADE PYELOGRAM, URETEROSCOPY AND STENT PLACEMENT Left 06/09/2017   Procedure: CYSTOSCOPY WITH LEFT RETROGRADE PYELOGRAM, LEFT URETEROSCOPY, LEFT URETEROSCOPIC STONE EXTRACTION, LEFT URETERAL STENT PLACEMENT;  Surgeon: Franchot Gallo, MD;  Location: [facility];  Service: Urology;  Laterality: Left;  . INGUINAL HERNIA REPAIR  1971   bilateral  . INSERTION OF SUPRAPUBIC CATHETER  06/09/2017   Procedure: EXCHANGE OF SUPRAPUBIC CATHETER;  Surgeon: Franchot Gallo, MD;  Location: [facility];  Service: Urology;;  . IR NEPHROSTOMY PLACEMENT LEFT  05/26/2017  . KIDNEY STONE SURGERY  09/13/2015  . NEPHROLITHOTOMY Left 09/13/2015   Procedure: LEFT PERCUTANEOUS NEPHROLITHOTOMY ;  Surgeon: Franchot Gallo, MD;  Location: [facility];  Service: Urology;  Laterality: Left;  . SUPRAPUBIC CATHETER INSERTION     HPI:  Morrison, yet medically-complex 76 y.o.malewith history of double sclerosis with neurogenic bladder, hypertension, paroxysmal atrial tachycardia,  hypothyroidism, and chronic kidney disease stage III, now presenting to the emergency department for evaluation of lower abdominal pain. Patient reports that he been in his usual state of health until the insidious development of lower abdominal pain over the past couple days. He saw his PCP for this yesterday, was diagnosed with UTI, and started on Levaquin. Unfortunately, after returning home, pain continued to worsen, prompting his presentation in the ED tonight. He reports some nausea with an episode of  nonbloody vomiting. Denies flank pain. Denies chest pain. ED Course:Upon arrival to the ED, patient is found to be febrile to 38 C, saturating low 90s on room air, slightly tachypneic, and hypertensive to 190/90. Chest x-ray features of chronic emphysematous and fibrotic changes, but no acute disease. Chemistry panel reveals a serum creatinine 1.69, consistent with his apparent baseline, and a slight elevation in AST. CBC is unremarkable. Lactic acid is reassuring at 1.52. Urinalysis features moderate leukocytes and positive nitrites. CT of the abdomen and pelvis is notable for bladder Cassady thickening consistent with cystitis, thickening of the proximal ureteral Braid suggestive of ureteritis, dilation of the CBD with stone at the level of the pancreatic head, and dilated loops of small bowel just proximal to a large ventral hernia suggesting partial SBO. Patient was treated with a liter of normal saline and empiric Rocephin in the ED. He remained hypertensive, but otherwise stable, and will be admitted to the medical surgical unit for ongoing evaluation and management of UTI and partial SBO. BSE ordered due to some coughing after liquids yesterday. Pt known to SLP service from h/o dysphagia and last MBSS 09/2014 (D3/mech soft with NTL and ok for thin b/w meals).   Assessment / Plan / Recommendation Clinical Impression  Pt seen at bedside for clinical swallow evaluation due to h/o dysphagia in setting of MS and reports of coughing after thin liquids yesterday. Pt known to SLP service from previous MBSS. Pt reports that he has been consuming a soft diet with thin liquids at home. He no longer has been thickening his liquids. Pt states that he feels that he is swallowing liquids better today due to upright positioning today. Pt consumed self presented thins, puree, and regular textures with one cough elicited. Pt does present with mild wet vocal quality, which improves with dry swallow. SLP offered MBSS given h/o  dysphagia, recent PNA, and Pt with c/o coughing with liquids yesterday, however Pt declines at this time stating that he is "doing better today". SLP explained that objective assessment can help provide additional information and would not bind him to "thickened liquids". SLP explained that it could be used to help Pt/family make informed decisions regardless if they chose to follow recommendations. Pt appears to understand his risk for aspiration and is willing to sit fully upright and take small sips. Recommend advancing diet as tolerated by surgery up to mechanical soft (D3) with thin liquids with aspiration and reflux precautions. Pt in agreement with plan of care. SLP will follow during acute stay as needed.   SLP Visit Diagnosis: Dysphagia, oropharyngeal phase (R13.12)    Aspiration Risk  Mild aspiration risk    Diet Recommendation Dysphagia 3 (Mech soft);Thin liquid   Liquid Administration via: Cup;Straw Medication Administration: Whole meds with puree Supervision: Patient able to self feed;Intermittent supervision to cue for compensatory strategies Compensations: Slow rate;Small sips/bites;Multiple dry swallows after each bite/sip Postural Changes: Seated upright at 90 degrees;Remain upright for at least 30 minutes after po intake    Other  Recommendations Oral Care Recommendations: Oral care BID;Staff/trained caregiver to provide oral care Other Recommendations: Clarify dietary restrictions   Follow up Recommendations None      Frequency and Duration min 2x/week  1 week       Prognosis Prognosis for Safe Diet Advancement: Good      Swallow Study   General Date of Onset: 07/10/17 HPI: HARSHIL CAVALLARO apleasant, yet medically-complex 76 y.o.malewith history of double sclerosis with neurogenic bladder, hypertension, paroxysmal atrial tachycardia, hypothyroidism, and chronic kidney disease stage III, now presenting to the emergency department for evaluation of lower abdominal  pain. Patient reports that he been in his usual state of health until the insidious development of lower abdominal pain over the past couple days. He saw his PCP for this yesterday, was diagnosed with UTI, and started on Levaquin. Unfortunately, after returning home, pain continued to worsen, prompting his presentation in the ED tonight. He reports some nausea with an episode of nonbloody vomiting. Denies flank pain. Denies chest pain. ED Course:Upon arrival to the ED, patient is found to be febrile to 38 C, saturating low 90s on room air, slightly tachypneic, and hypertensive to 190/90. Chest x-ray features of chronic emphysematous and fibrotic changes, but no acute disease. Chemistry panel reveals a serum creatinine 1.69, consistent with his apparent baseline, and a slight elevation in AST. CBC is unremarkable. Lactic acid is reassuring at 1.52. Urinalysis features moderate leukocytes and positive nitrites. CT of the abdomen and pelvis is notable for bladder Winkels thickening consistent with cystitis, thickening of the proximal ureteral Leppert suggestive of ureteritis, dilation of the CBD with stone at the level of the pancreatic head, and dilated loops of small bowel just proximal to a large ventral hernia suggesting partial SBO. Patient was treated with a liter of normal saline and empiric Rocephin in the ED. He remained hypertensive, but otherwise stable, and will be admitted to the medical surgical unit for ongoing evaluation and management of UTI and partial SBO. BSE ordered due to some coughing after liquids yesterday. Pt known to SLP service from h/o dysphagia and last MBSS 09/2014 (D3/mech soft with NTL and ok for thin b/w meals). Type of Study: Bedside Swallow Evaluation Previous Swallow Assessment: BSE 07/2015, MBS 09/2014 D3 and NTL, thin b/w meals Diet Prior to this Study:  (full liquid follow SBO) Temperature Spikes Noted: No Respiratory Status: Room air History of Recent Intubation:  No Behavior/Cognition: Alert;Cooperative;Pleasant mood Oral Cavity Assessment: Within Functional Limits Oral Care Completed by SLP: No Oral Cavity - Dentition: Adequate natural dentition Vision: Functional for self-feeding Self-Feeding Abilities: Able to feed self Patient Positioning: Upright in bed Baseline Vocal Quality: Normal Volitional Cough: Strong Volitional Swallow: Able to elicit    Oral/Motor/Sensory Function Overall Oral Motor/Sensory Function: Within functional limits   Ice Chips Ice chips: Within functional limits Presentation: Spoon   Thin Liquid Thin Liquid: Within functional limits Presentation: Self Fed;Straw;Cup    Nectar Thick Nectar Thick Liquid: Not tested   Honey Thick Honey Thick Liquid: Not tested   Puree Puree: Within functional limits Presentation: Self Fed;Spoon   Solid   Thank you,  Genene Churn, CCC-SLP [phone]    Solid: Within functional limits Presentation: Self Fed (one delayed cough)        Daejah Klebba 07/13/2017,12:48 PM

## 2017-07-13 NOTE — Progress Notes (Signed)
Subjective: He says he feels better. Nasogastric tube has been removed and he was able to take clear liquids. He is however having substantial diarrhea. I think this is from release of his small bowel obstruction but he is on IV antibiotics. He lost IV access. He complains of coughing when he tries to drink water  Objective: Vital signs in last 24 hours: Temp:  [97.7 F (36.5 C)-98.5 F (36.9 C)] 98.4 F (36.9 C) (10/15 0558) Pulse Rate:  [57-67] 62 (10/15 0558) Resp:  [16] 16 (10/15 0558) BP: (148-155)/(71-74) 148/74 (10/15 0558) SpO2:  [96 %-99 %] 96 % (10/15 0558) Weight change:  Last BM Date: 07/12/17  Intake/Output from previous day: 10/14 0701 - 10/15 0700 In: 480 [P.O.:480] Out: 800 [Urine:500; Emesis/NG output:300]  PHYSICAL EXAM General appearance: alert, cooperative and mild distress Resp: rhonchi bilaterally Cardio: regular rate and rhythm, S1, S2 normal, no murmur, click, rub or gallop GI: soft, non-tender; bowel sounds normal; no masses,  no organomegaly Extremities: extremities normal, atraumatic, no cyanosis or edema Skin warm and dry  Lab Results:  No results found for this or any previous visit (from the past 48 hour(s)).  ABGS No results for input(s): PHART, PO2ART, TCO2, HCO3 in the last 72 hours.  Invalid input(s): PCO2 CULTURES Recent Results (from the past 240 hour(s))  Blood culture (routine x 2)     Status: None (Preliminary result)   Collection Time: 07/11/17 12:30 AM  Result Value Ref Range Status   Specimen Description RIGHT ANTECUBITAL  Final   Special Requests   Final    BOTTLES DRAWN AEROBIC AND ANAEROBIC Blood Culture adequate volume   Culture NO GROWTH 2 DAYS  Final   Report Status PENDING  Incomplete  Blood culture (routine x 2)     Status: None (Preliminary result)   Collection Time: 07/11/17 12:45 AM  Result Value Ref Range Status   Specimen Description BLOOD RIGHT ARM  Final   Special Requests   Final    BOTTLES DRAWN AEROBIC  ONLY Blood Culture adequate volume   Culture NO GROWTH 2 DAYS  Final   Report Status PENDING  Incomplete  Urine Culture     Status: Abnormal (Preliminary result)   Collection Time: 07/11/17 12:45 AM  Result Value Ref Range Status   Specimen Description URINE, CATHETERIZED  Final   Special Requests NONE  Final   Culture >=100,000 COLONIES/mL STENOTROPHOMONAS MALTOPHILIA (A)  Final   Report Status PENDING  Incomplete   Studies/Results: Dg Chest 1 View  Result Date: 07/11/2017 CLINICAL DATA:  NG tube placement. EXAM: CHEST 1 VIEW COMPARISON:  07/11/2017 FINDINGS: Enteric catheter tip overlies the central lower thorax. Cardiomediastinal silhouette is normal. Calcific atherosclerotic disease and tortuosity of the aorta. There is no evidence of pneumothorax. Bilateral, left greater than right, lower thorax airspace consolidation. Osseous structures are without acute abnormality. Soft tissues are grossly normal. IMPRESSION: Enteric catheter overlies the central lower thorax. Interval development of bilateral lower lungs airspace consolidation, left greater than right. Electronically Signed   By: Fidela Salisbury M.D.   On: 07/11/2017 12:29   Dg Abd 1 View  Result Date: 07/11/2017 CLINICAL DATA:  Status post NG tube placement. EXAM: ABDOMEN - 1 VIEW COMPARISON:  CT abdomen pelvis 07/11/2017 FINDINGS: NG tube is in place with the tip and side-port in the stomach. No acute abnormality. IMPRESSION: NG tube in good position. Electronically Signed   By: Inge Rise M.D.   On: 07/11/2017 13:29    Medications:  Prior  to Admission:  Prescriptions Prior to Admission  Medication Sig Dispense Refill Last Dose  . carvedilol (COREG) 6.25 MG tablet Take 1 tablet (6.25 mg total) by mouth 2 (two) times daily with a meal.   07/10/2017 at 800  . diazepam (VALIUM) 5 MG tablet TAKE 1 TABLET BY MOUTH TWICE A DAY 60 tablet 4 07/10/2017 at Unknown time  . HYDROcodone-acetaminophen (NORCO) 7.5-325 MG per tablet  Take 1 tablet by mouth 2 (two) times daily. Max APAP 3 GM IN 24 HOURS FROM ALL SOURCES (Patient taking differently: Take 1 tablet by mouth 2 (two) times daily. May take up to 4 times daily as needed for pain) 60 tablet 0 07/10/2017 at Unknown time  . ibuprofen (ADVIL,MOTRIN) 200 MG tablet Take 200 mg by mouth every 6 (six) hours as needed for fever.   unknown  . levothyroxine (SYNTHROID, LEVOTHROID) 88 MCG tablet Take 88 mcg by mouth daily before breakfast.   07/10/2017 at Unknown time  . potassium chloride SA (K-DUR,KLOR-CON) 20 MEQ tablet Take 20 mEq by mouth daily.   07/10/2017 at Unknown time  . solifenacin (VESICARE) 10 MG tablet Take 10 mg by mouth daily as needed (for spasms).    unknown  . verapamil (CALAN) 40 MG tablet Take 40 mg by mouth 2 (two) times daily.   07/10/2017 at Unknown time  . predniSONE (STERAPRED UNI-PAK 21 TAB) 10 MG (21) TBPK tablet Take by package instructions (Patient not taking: Reported on 07/11/2017) 21 tablet 0 Completed Course at Unknown time   Scheduled: . carvedilol  6.25 mg Oral BID WC  . darifenacin  15 mg Oral Daily  . diazepam  5 mg Oral BID  . heparin  5,000 Units Subcutaneous Q8H  . levothyroxine  88 mcg Oral QAC breakfast  . verapamil  40 mg Oral BID   Continuous: . meropenem (MERREM) IV Stopped (07/12/17 1219)  . ondansetron (ZOFRAN) IV     CNO:[id number] **OR** acetaminophen, fentaNYL (SUBLIMAZE) injection, hydrALAZINE, HYDROcodone-acetaminophen, loperamide, ondansetron **OR** ondansetron (ZOFRAN) IV  Assesment: He was admitted with urinary tract infection and organism is growing but we don't have sensitivities yet. He has no IV access so is going to have to have a PICC line or midline catheter. He had partial small bowel obstruction which has released and is having a lot of diarrhea but the diarrhea could be related to his IV antibiotics. At baseline he has COPD which is stable but he says he is coughing when he drinks water. Principal  Problem:   UTI (urinary tract infection) Active Problems:   Multiple sclerosis (H[facility])   HTN (hypertension), malignant   Chronic suprapubic catheter (H[facility])   Hypothyroidism   CKD (chronic kidney disease), stage III (H[facility])   Paroxysmal atrial tachycardia (H[facility])   Partial small bowel obstruction (H[facility])   Choledocholithiasis    Plan: Switch him back to oral meds. Speech consultation regarding swallowing. Await sensitivities. PICC line or midline catheter. Advance his diet    LOS: 2 days   Deoni Cosey L 07/13/2017, 8:35 AM

## 2017-07-13 NOTE — Progress Notes (Signed)
[facility] Progress Note     Subjective: Tolerating diet and having BMs.   Objective: Vital signs in last 24 hours: Temp:  [97.7 F (36.5 C)-98.5 F (36.9 C)] 98.4 F (36.9 C) (10/15 0558) Pulse Rate:  [57-67] 62 (10/15 0558) Resp:  [16] 16 (10/15 0558) BP: (148-155)/(71-74) 148/74 (10/15 0558) SpO2:  [96 %-99 %] 96 % (10/15 0558) Last BM Date: 07/12/17  Intake/Output from previous day: 10/14 0701 - 10/15 0700 In: 480 [P.O.:480] Out: 800 [Urine:500; Emesis/NG output:300] Intake/Output this shift: Total I/O In: 240 [P.O.:240] Out: -   General appearance: alert, cooperative and no distress Resp: normal work breathing GI: soft, non-tender; bowel sounds normal; no masses,  no organomegaly and hernia defect reducible  Lab Results:   Recent Labs  07/10/17 2219 07/11/17 0742  WBC 8.3 7.3  HGB 14.0 13.7  HCT 42.6 42.5  PLT 235 235   BMET  Recent Labs  07/10/17 2219 07/11/17 0742  NA 139 135  K 4.8 4.5  CL 101 99*  CO2 27 26  GLUCOSE 122* 122*  BUN 19 15  CREATININE 1.69* 1.41*  CALCIUM 9.5 8.3*   PT/INR No results for input(s): LABPROT, INR in the last 72 hours.  Studies/Results: Dg Chest 1 View  Result Date: 07/11/2017 CLINICAL DATA:  NG tube placement. EXAM: CHEST 1 VIEW COMPARISON:  07/11/2017 FINDINGS: Enteric catheter tip overlies the central lower thorax. Cardiomediastinal silhouette is normal. Calcific atherosclerotic disease and tortuosity of the aorta. There is no evidence of pneumothorax. Bilateral, left greater than right, lower thorax airspace consolidation. Osseous structures are without acute abnormality. Soft tissues are grossly normal. IMPRESSION: Enteric catheter overlies the central lower thorax. Interval development of bilateral lower lungs airspace consolidation, left greater than right. Electronically Signed   By: Fidela Salisbury M.D.   On: 07/11/2017 12:29   Dg Abd 1 View  Result Date: 07/11/2017 CLINICAL DATA:   Status post NG tube placement. EXAM: ABDOMEN - 1 VIEW COMPARISON:  CT abdomen pelvis 07/11/2017 FINDINGS: NG tube is in place with the tip and side-port in the stomach. No acute abnormality. IMPRESSION: NG tube in good position. Electronically Signed   By: Inge Rise M.D.   On: 07/11/2017 13:29    Anti-infectives: Anti-infectives    Start     Dose/Rate Route Frequency Ordered Stop   07/11/17 1500  meropenem (MERREM) 1 g in sodium chloride 0.9 % 100 mL IVPB     1 g 200 mL/hr over 30 Minutes Intravenous Every 12 hours 07/11/17 0901     07/11/17 0330  meropenem (MERREM) 1 g in sodium chloride 0.9 % 100 mL IVPB     1 g 200 mL/hr over 30 Minutes Intravenous  Once 07/11/17 0321 07/11/17 0520   07/11/17 0215  cefTRIAXone (ROCEPHIN) 1 g in dextrose 5 % 50 mL IVPB     1 g 100 mL/hr over 30 Minutes Intravenous  Once 07/11/17 0203 07/11/17 7353      Assessment/Plan: Jordan Ward is a 76 yo with resolved pSBO/ ileus doing better. -Adv diet as tolerated Will sign off.     LOS: 2 days    Virl Cagey 07/13/2017

## 2017-07-14 LAB — CBC WITH DIFFERENTIAL/PLATELET
Basophils Absolute: 0 10*3/uL (ref 0.0–0.1)
Basophils Relative: 0 %
EOS ABS: 0.3 10*3/uL (ref 0.0–0.7)
EOS PCT: 4 %
HEMATOCRIT: 37 % — AB (ref 39.0–52.0)
HEMOGLOBIN: 12.2 g/dL — AB (ref 13.0–17.0)
LYMPHS ABS: 2.5 10*3/uL (ref 0.7–4.0)
LYMPHS PCT: 36 %
MCH: 30.5 pg (ref 26.0–34.0)
MCHC: 33 g/dL (ref 30.0–36.0)
MCV: 92.5 fL (ref 78.0–100.0)
MONO ABS: 0.5 10*3/uL (ref 0.1–1.0)
Monocytes Relative: 8 %
Neutro Abs: 3.5 10*3/uL (ref 1.7–7.7)
Neutrophils Relative %: 52 %
Platelets: 251 10*3/uL (ref 150–400)
RBC: 4 MIL/uL — ABNORMAL LOW (ref 4.22–5.81)
RDW: 13.4 % (ref 11.5–15.5)
WBC: 6.8 10*3/uL (ref 4.0–10.5)

## 2017-07-14 LAB — BASIC METABOLIC PANEL
Anion gap: 10 (ref 5–15)
BUN: 15 mg/dL (ref 6–20)
CALCIUM: 8.3 mg/dL — AB (ref 8.9–10.3)
CO2: 27 mmol/L (ref 22–32)
CREATININE: 1.41 mg/dL — AB (ref 0.61–1.24)
Chloride: 98 mmol/L — ABNORMAL LOW (ref 101–111)
GFR calc non Af Amer: 48 mL/min — ABNORMAL LOW (ref >60)
GFR, EST AFRICAN AMERICAN: 55 mL/min — AB (ref >60)
Glucose, Bld: 92 mg/dL (ref 65–99)
Potassium: 3 mmol/L — ABNORMAL LOW (ref 3.5–5.1)
Sodium: 135 mmol/L (ref 135–145)

## 2017-07-14 MED ORDER — SULFAMETHOXAZOLE-TRIMETHOPRIM 800-160 MG PO TABS
1.0000 | ORAL_TABLET | Freq: Two times a day (BID) | ORAL | Status: DC
  Administered 2017-07-14 – 2017-07-17 (×7): 1 via ORAL
  Filled 2017-07-14 (×8): qty 1

## 2017-07-14 MED ORDER — POTASSIUM CHLORIDE CRYS ER 20 MEQ PO TBCR
20.0000 meq | EXTENDED_RELEASE_TABLET | Freq: Two times a day (BID) | ORAL | Status: DC
  Administered 2017-07-14 – 2017-07-17 (×7): 20 meq via ORAL
  Filled 2017-07-14 (×6): qty 1
  Filled 2017-07-14: qty 2

## 2017-07-14 NOTE — Progress Notes (Signed)
Subjective: He says he feels better. Speech evaluation noted. No new complaints. He still has some diarrhea. He's not nauseated. No abdominal pain. His urine culture has come back but we don't have sensitivities yet  Objective: Vital signs in last 24 hours: Temp:  [98.2 F (36.8 C)-98.4 F (36.9 C)] 98.4 F (36.9 C) (10/16 0427) Pulse Rate:  [49-65] 65 (10/16 0427) Resp:  [16-18] 18 (10/16 0427) BP: (120-183)/(62-86) 183/70 (10/16 0427) SpO2:  [96 %-98 %] 98 % (10/16 0427) Weight:  [58.8 kg (129 lb 11.2 oz)] 58.8 kg (129 lb 11.2 oz) (10/16 0500) Weight change:  Last BM Date: 07/13/17  Intake/Output from previous day: 10/15 0701 - 10/16 0700 In: 240 [P.O.:240] Out: 250 [Urine:250]  PHYSICAL EXAM General appearance: alert, cooperative and mild distress Resp: clear to auscultation bilaterally Cardio: regular rate and rhythm, S1, S2 normal, no murmur, click, rub or gallop GI: soft, non-tender; bowel sounds normal; no masses,  no organomegaly Extremities: extremities normal, atraumatic, no cyanosis or edema Skin warm and dry  Lab Results:  Results for orders placed or performed during the hospital encounter of 07/10/17 (from the past 48 hour(s))  CBC with Differential/Platelet     Status: Abnormal   Collection Time: 07/14/17  6:06 AM  Result Value Ref Range   WBC 6.8 4.0 - 10.5 K/uL   RBC 4.00 (L) 4.22 - 5.81 MIL/uL   Hemoglobin 12.2 (L) 13.0 - 17.0 g/dL   HCT 37.0 (L) 39.0 - 52.0 %   MCV 92.5 78.0 - 100.0 fL   MCH 30.5 26.0 - 34.0 pg   MCHC 33.0 30.0 - 36.0 g/dL   RDW 13.4 11.5 - 15.5 %   Platelets 251 150 - 400 K/uL   Neutrophils Relative % 52 %   Neutro Abs 3.5 1.7 - 7.7 K/uL   Lymphocytes Relative 36 %   Lymphs Abs 2.5 0.7 - 4.0 K/uL   Monocytes Relative 8 %   Monocytes Absolute 0.5 0.1 - 1.0 K/uL   Eosinophils Relative 4 %   Eosinophils Absolute 0.3 0.0 - 0.7 K/uL   Basophils Relative 0 %   Basophils Absolute 0.0 0.0 - 0.1 K/uL  Basic metabolic panel     Status:  Abnormal   Collection Time: 07/14/17  6:06 AM  Result Value Ref Range   Sodium 135 135 - 145 mmol/L   Potassium 3.0 (L) 3.5 - 5.1 mmol/L   Chloride 98 (L) 101 - 111 mmol/L   CO2 27 22 - 32 mmol/L   Glucose, Bld 92 65 - 99 mg/dL   BUN 15 6 - 20 mg/dL   Creatinine, Ser 1.41 (H) 0.61 - 1.24 mg/dL   Calcium 8.3 (L) 8.9 - 10.3 mg/dL   GFR calc non Af Amer 48 (L) >60 mL/min   GFR calc Af Amer 55 (L) >60 mL/min    Comment: (NOTE) The eGFR has been calculated using the CKD EPI equation. This calculation has not been validated in all clinical situations. eGFR's persistently <60 mL/min signify possible Chronic Kidney Disease.    Anion gap 10 5 - 15    ABGS No results for input(s): PHART, PO2ART, TCO2, HCO3 in the last 72 hours.  Invalid input(s): PCO2 CULTURES Recent Results (from the past 240 hour(s))  Blood culture (routine x 2)     Status: None (Preliminary result)   Collection Time: 07/11/17 12:30 AM  Result Value Ref Range Status   Specimen Description RIGHT ANTECUBITAL  Final   Special Requests   Final  BOTTLES DRAWN AEROBIC AND ANAEROBIC Blood Culture adequate volume   Culture NO GROWTH 3 DAYS  Final   Report Status PENDING  Incomplete  Blood culture (routine x 2)     Status: None (Preliminary result)   Collection Time: 07/11/17 12:45 AM  Result Value Ref Range Status   Specimen Description BLOOD RIGHT ARM  Final   Special Requests   Final    BOTTLES DRAWN AEROBIC ONLY Blood Culture adequate volume   Culture NO GROWTH 3 DAYS  Final   Report Status PENDING  Incomplete  Urine Culture     Status: Abnormal   Collection Time: 07/11/17 12:45 AM  Result Value Ref Range Status   Specimen Description URINE, CATHETERIZED  Final   Special Requests NONE  Final   Culture >=100,000 COLONIES/mL STENOTROPHOMONAS MALTOPHILIA (A)  Final   Report Status 07/13/2017 FINAL  Final   Organism ID, Bacteria STENOTROPHOMONAS MALTOPHILIA (A)  Final      Susceptibility   Stenotrophomonas  maltophilia - MIC*    LEVOFLOXACIN 0.25 SENSITIVE Sensitive     TRIMETH/SULFA <=20 SENSITIVE Sensitive     * >=100,000 COLONIES/mL STENOTROPHOMONAS MALTOPHILIA   Studies/Results: No results found.  Medications:  Prior to Admission:  Prescriptions Prior to Admission  Medication Sig Dispense Refill Last Dose  . carvedilol (COREG) 6.25 MG tablet Take 1 tablet (6.25 mg total) by mouth 2 (two) times daily with a meal.   07/10/2017 at 800  . diazepam (VALIUM) 5 MG tablet TAKE 1 TABLET BY MOUTH TWICE A DAY 60 tablet 4 07/10/2017 at Unknown time  . HYDROcodone-acetaminophen (NORCO) 7.5-325 MG per tablet Take 1 tablet by mouth 2 (two) times daily. Max APAP 3 GM IN 24 HOURS FROM ALL SOURCES (Patient taking differently: Take 1 tablet by mouth 2 (two) times daily. May take up to 4 times daily as needed for pain) 60 tablet 0 07/10/2017 at Unknown time  . ibuprofen (ADVIL,MOTRIN) 200 MG tablet Take 200 mg by mouth every 6 (six) hours as needed for fever.   unknown  . levothyroxine (SYNTHROID, LEVOTHROID) 88 MCG tablet Take 88 mcg by mouth daily before breakfast.   07/10/2017 at Unknown time  . potassium chloride SA (K-DUR,KLOR-CON) 20 MEQ tablet Take 20 mEq by mouth daily.   07/10/2017 at Unknown time  . solifenacin (VESICARE) 10 MG tablet Take 10 mg by mouth daily as needed (for spasms).    unknown  . verapamil (CALAN) 40 MG tablet Take 40 mg by mouth 2 (two) times daily.   07/10/2017 at Unknown time  . predniSONE (STERAPRED UNI-PAK 21 TAB) 10 MG (21) TBPK tablet Take by package instructions (Patient not taking: Reported on 07/11/2017) 21 tablet 0 Completed Course at Unknown time   Scheduled: . carvedilol  6.25 mg Oral BID WC  . darifenacin  15 mg Oral Daily  . diazepam  5 mg Oral BID  . heparin  5,000 Units Subcutaneous Q8H  . levothyroxine  88 mcg Oral QAC breakfast  . potassium chloride  20 mEq Oral BID  . verapamil  40 mg Oral BID   Continuous: . meropenem (MERREM) IV Stopped (07/13/17 2305)   . ondansetron (ZOFRAN) IV     CHE:NIDPOEUMPNTIR **OR** acetaminophen, fentaNYL (SUBLIMAZE) injection, hydrALAZINE, HYDROcodone-acetaminophen, loperamide, ondansetron **OR** ondansetron (ZOFRAN) IV  Assesment: He was admitted with a urinary tract infection and he is better. He is still on meropenem pending sensitivities. He also had partial small bowel obstruction which has released. He has hypertension at baseline which is better  controlled. He has multiple sclerosis at baseline which is better controlled. He has chronic urinary retention and has a chronic suprapubic urinary catheter Principal Problem:   UTI (urinary tract infection) Active Problems:   Multiple sclerosis (HCC)   HTN (hypertension), malignant   Chronic suprapubic catheter (HCC)   Hypothyroidism   CKD (chronic kidney disease), stage III (HCC)   Paroxysmal atrial tachycardia (HCC)   Partial small bowel obstruction ([place])   Choledocholithiasis    Plan: Continue current treatments. No change in medications. Probably home in the next 24-48 hours    LOS: 3 days   Haim Hansson L 07/14/2017, 8:54 AM

## 2017-07-15 ENCOUNTER — Inpatient Hospital Stay (HOSPITAL_COMMUNITY): Payer: Medicare Other

## 2017-07-15 DIAGNOSIS — N390 Urinary tract infection, site not specified: Secondary | ICD-10-CM

## 2017-07-15 DIAGNOSIS — N3941 Urge incontinence: Secondary | ICD-10-CM | POA: Diagnosis not present

## 2017-07-15 LAB — BASIC METABOLIC PANEL
ANION GAP: 9 (ref 5–15)
BUN: 18 mg/dL (ref 6–20)
CALCIUM: 8.5 mg/dL — AB (ref 8.9–10.3)
CO2: 23 mmol/L (ref 22–32)
CREATININE: 1.66 mg/dL — AB (ref 0.61–1.24)
Chloride: 102 mmol/L (ref 101–111)
GFR, EST AFRICAN AMERICAN: 45 mL/min — AB (ref >60)
GFR, EST NON AFRICAN AMERICAN: 39 mL/min — AB (ref >60)
GLUCOSE: 93 mg/dL (ref 65–99)
Potassium: 3.5 mmol/L (ref 3.5–5.1)
Sodium: 134 mmol/L — ABNORMAL LOW (ref 135–145)

## 2017-07-15 MED ORDER — MIRABEGRON ER 25 MG PO TB24
50.0000 mg | ORAL_TABLET | Freq: Every day | ORAL | Status: DC
  Administered 2017-07-15 – 2017-07-17 (×3): 50 mg via ORAL
  Filled 2017-07-15 (×4): qty 2

## 2017-07-15 NOTE — Consult Note (Signed)
Urology Consult  Referring physician: Dr. Luan Pulling Reason for referral: UTI and leakage around [facility] tube  Chief Complaint: leakage around SP Tube  History of Present Illness: Mr Jordan Ward is a 76yo with a hx of MS and chronic SP tube who was admitted with SOB and UTI. His urine culture grew pseudomonas. He has had multiple UTIs requiring hospitalization in the past year. He underwent L ureteral stone extraction 1 month ago for an infected ureteral stone. He had his SP tube exchanged at that time. He has been having urinary incontinence around his SP tube for several months which has been managed with vesicare 60m daily with good results. His incontinence worsens with UTI. He denies any urinary incontinence through his penis. He soaks 3-4 pads per day for the last week. He denies hematuria. No nausea or vomiting. No suprapubic pain. The patient is currently on enablex during this hospitalization   Past Medical History:  Diagnosis Date  . Anemia   . Arthritis   . Back pain, chronic   . Bilateral carotid bruits   . C. difficile colitis 09/2011  . CAD (coronary artery disease)   . Carotid artery stenosis   . Cerebrovascular disease   . Colon polyps   . Dyslipidemia   . Dysphagia 10/07/2011  . Encephalopathy   . Gait disorder   . HA (headache)   . High grade dysplasia in colonic adenoma 09/2005  . History of kidney stones   . HTN (hypertension), malignant 10/06/2011  . Hypernatremia   . Hypokalemia   . Hypothyroidism 10/08/2011  . Insomnia   . Junctional rhythm   . Kidney stones   . MS (multiple sclerosis) ([place]   . Neuromuscular disorder ([place]   . OSA (obstructive sleep apnea)   . Paroxysmal atrial tachycardia ([place]   . Peripheral vascular disease ([place]   . Pneumonia 4 yrs ago  . Pulmonary fibrosis ([place] 10/06/2011  . Pulmonary nodule 10/08/2011  . PVD (peripheral vascular disease) ([place]   . Sacral ulcer ([place]   . Sleep apnea   . Stroke ([place]   . TIA (transient ischemic attack)   . Tremors of  nervous system 10/08/2011  . Urinary tract infection    Past Surgical History:  Procedure Laterality Date  . APPENDECTOMY  09/2005   at time of left hemicolectomy  . BACK SURGERY  1976/1979   lower  . CHOLECYSTECTOMY     Dr. STamala Julian . COLON SURGERY  09/2005   [place]: four tubular adenomas, large adenomatous polyp with HIGH GRADE dysplasia  . COLONOSCOPY  11/2004   Dr. RSharol Rousselsessile polyp splenic flexure, 185msessile polyp desc colon, tubulovillous adenoma (bx not removed)  . COLONOSCOPY  01/2005   poor prep, polyp could not be found  . COLONOSCOPY  05/2005   with EMR, polypectomy Dr. JoOlegario Messierbx showed high grade dysplasia, partially resected  . COLONOSCOPY  09/2005   Dr. GiArsenio LoaderinNigernk tattooing, four villous colon polyp (3 had been missed on previous colonoscopies due to limitations of procedures  . COLONOSCOPY  09/2006   normal TI, no polyps  . COLONOSCOPY  10/2007   Dr. RoImogene Burnistal mammillations, benign bx, normal TI, random bx neg for microscopic colitis  . CYSTOSCOPY WITH RETROGRADE PYELOGRAM, URETEROSCOPY AND STENT PLACEMENT Left 06/09/2017   Procedure: CYSTOSCOPY WITH LEFT RETROGRADE PYELOGRAM, LEFT URETEROSCOPY, LEFT URETEROSCOPIC STONE EXTRACTION, LEFT URETERAL STENT PLACEMENT;  Surgeon: DaFranchot GalloMD;  Location: [facility];  Service: Urology;  Laterality: Left;  . INGUINAL  HERNIA REPAIR  1971   bilateral  . INSERTION OF SUPRAPUBIC CATHETER  06/09/2017   Procedure: EXCHANGE OF SUPRAPUBIC CATHETER;  Surgeon: Franchot Gallo, MD;  Location: [facility];  Service: Urology;;  . IR NEPHROSTOMY PLACEMENT LEFT  05/26/2017  . KIDNEY STONE SURGERY  09/13/2015  . NEPHROLITHOTOMY Left 09/13/2015   Procedure: LEFT PERCUTANEOUS NEPHROLITHOTOMY ;  Surgeon: Franchot Gallo, MD;  Location: [facility];  Service: Urology;  Laterality: Left;  . SUPRAPUBIC CATHETER INSERTION      Medications: I have reviewed the patient's current medications. Allergies:  Allergies   Allergen Reactions  . Tetracyclines & Related Anaphylaxis and Rash  . Ciprofloxacin     Trouble swallowing unknown reaction according to wife     Family History  Problem Relation Age of Onset  . Cirrhosis Brother        etoh  . Stroke Mother 101  . Coronary artery disease Father 4  . Heart attack Brother   . Cancer Sister   . Multiple sclerosis Unknown   . Colon cancer Neg Hx    Social History:  reports that he has quit smoking. His smoking use included Cigarettes. He has never used smokeless tobacco. He reports that he does not drink alcohol or use drugs.  Review of Systems  Respiratory: Positive for shortness of breath.   Genitourinary: Positive for urgency.  All other systems reviewed and are negative.   Physical Exam:  Vital signs in last 24 hours: Temp:  [98.3 F (36.8 C)-98.5 F (36.9 C)] 98.5 F (36.9 C) (10/17 1300) Pulse Rate:  [51-63] 51 (10/17 1300) Resp:  [18] 18 (10/17 1300) BP: (163-164)/(79-84) 163/84 (10/17 1300) SpO2:  [93 %-97 %] 94 % (10/17 2037) Weight:  [59.6 kg (131 lb 8 oz)] 59.6 kg (131 lb 8 oz) (10/17 0500) Physical Exam  Constitutional: He is oriented to person, place, and time. He appears well-developed and well-nourished.  HENT:  Head: Normocephalic and atraumatic.  Eyes: Pupils are equal, round, and reactive to light. EOM are normal.  Neck: Normal range of motion. No thyromegaly present.  Cardiovascular: Normal rate and regular rhythm.   Respiratory: Effort normal. No respiratory distress.  GI: Soft. He exhibits no distension.  SP tube site clean and dry. No erythema, no induration.   Genitourinary: Testes normal and penis normal.  Musculoskeletal: Normal range of motion. He exhibits no edema.  Neurological: He is alert and oriented to person, place, and time.  Skin: Skin is warm and dry.  Psychiatric: He has a normal mood and affect. His behavior is normal. Judgment and thought content normal.    Laboratory Data:  Results for  orders placed or performed during the hospital encounter of 07/10/17 (from the past 72 hour(s))  CBC with Differential/Platelet     Status: Abnormal   Collection Time: 07/14/17  6:06 AM  Result Value Ref Range   WBC 6.8 4.0 - 10.5 K/uL   RBC 4.00 (L) 4.22 - 5.81 MIL/uL   Hemoglobin 12.2 (L) 13.0 - 17.0 g/dL   HCT 37.0 (L) 39.0 - 52.0 %   MCV 92.5 78.0 - 100.0 fL   MCH 30.5 26.0 - 34.0 pg   MCHC 33.0 30.0 - 36.0 g/dL   RDW 13.4 11.5 - 15.5 %   Platelets 251 150 - 400 K/uL   Neutrophils Relative % 52 %   Neutro Abs 3.5 1.7 - 7.7 K/uL   Lymphocytes Relative 36 %   Lymphs Abs 2.5 0.7 - 4.0 K/uL   Monocytes  Relative 8 %   Monocytes Absolute 0.5 0.1 - 1.0 K/uL   Eosinophils Relative 4 %   Eosinophils Absolute 0.3 0.0 - 0.7 K/uL   Basophils Relative 0 %   Basophils Absolute 0.0 0.0 - 0.1 K/uL  Basic metabolic panel     Status: Abnormal   Collection Time: 07/14/17  6:06 AM  Result Value Ref Range   Sodium 135 135 - 145 mmol/L   Potassium 3.0 (L) 3.5 - 5.1 mmol/L   Chloride 98 (L) 101 - 111 mmol/L   CO2 27 22 - 32 mmol/L   Glucose, Bld 92 65 - 99 mg/dL   BUN 15 6 - 20 mg/dL   Creatinine, Ser 1.41 (H) 0.61 - 1.24 mg/dL   Calcium 8.3 (L) 8.9 - 10.3 mg/dL   GFR calc non Af Amer 48 (L) >60 mL/min   GFR calc Af Amer 55 (L) >60 mL/min    Comment: (NOTE) The eGFR has been calculated using the CKD EPI equation. This calculation has not been validated in all clinical situations. eGFR's persistently <60 mL/min signify possible Chronic Kidney Disease.    Anion gap 10 5 - 15  Basic metabolic panel     Status: Abnormal   Collection Time: 07/15/17  4:53 AM  Result Value Ref Range   Sodium 134 (L) 135 - 145 mmol/L   Potassium 3.5 3.5 - 5.1 mmol/L   Chloride 102 101 - 111 mmol/L   CO2 23 22 - 32 mmol/L   Glucose, Bld 93 65 - 99 mg/dL   BUN 18 6 - 20 mg/dL   Creatinine, Ser 1.66 (H) 0.61 - 1.24 mg/dL   Calcium 8.5 (L) 8.9 - 10.3 mg/dL   GFR calc non Af Amer 39 (L) >60 mL/min   GFR calc  Af Amer 45 (L) >60 mL/min    Comment: (NOTE) The eGFR has been calculated using the CKD EPI equation. This calculation has not been validated in all clinical situations. eGFR's persistently <60 mL/min signify possible Chronic Kidney Disease.    Anion gap 9 5 - 15   Recent Results (from the past 240 hour(s))  Blood culture (routine x 2)     Status: None (Preliminary result)   Collection Time: 07/11/17 12:30 AM  Result Value Ref Range Status   Specimen Description RIGHT ANTECUBITAL  Final   Special Requests   Final    BOTTLES DRAWN AEROBIC AND ANAEROBIC Blood Culture adequate volume   Culture NO GROWTH 4 DAYS  Final   Report Status PENDING  Incomplete  Blood culture (routine x 2)     Status: None (Preliminary result)   Collection Time: 07/11/17 12:45 AM  Result Value Ref Range Status   Specimen Description BLOOD RIGHT ARM  Final   Special Requests   Final    BOTTLES DRAWN AEROBIC ONLY Blood Culture adequate volume   Culture NO GROWTH 4 DAYS  Final   Report Status PENDING  Incomplete  Urine Culture     Status: Abnormal   Collection Time: 07/11/17 12:45 AM  Result Value Ref Range Status   Specimen Description URINE, CATHETERIZED  Final   Special Requests NONE  Final   Culture >=100,000 COLONIES/mL STENOTROPHOMONAS MALTOPHILIA (A)  Final   Report Status 07/13/2017 FINAL  Final   Organism ID, Bacteria STENOTROPHOMONAS MALTOPHILIA (A)  Final      Susceptibility   Stenotrophomonas maltophilia - MIC*    LEVOFLOXACIN 0.25 SENSITIVE Sensitive     TRIMETH/SULFA <=20 SENSITIVE Sensitive     * >=  100,000 COLONIES/mL STENOTROPHOMONAS MALTOPHILIA   Creatinine:  Recent Labs  07/10/17 2219 07/11/17 0742 07/14/17 0606 07/15/17 0453  CREATININE 1.69* 1.41* 1.41* 1.66*   Baseline Creatinine: 1.5  Impression/Assessment:  76yo with neurogenic bladder and urinary incontinnce, UTI  Plan:  1. UTI: Please treat with bactrim DS BID for 7 days 2. Urinary incontinence.: The incontinence is  likely a combination of neurogenic bladder from MS and UTI. Please add mirabegron 69m daily to his regiment. If this fails to improve his incontinence the patient will need to restart his home vesicare 183mdaily regiment.   PaNicolette Bang0/17/2018, 9:41 PM

## 2017-07-15 NOTE — Care Management Note (Signed)
Case Management Note  Patient Details  Name: Jordan Ward MRN: [id number] Date of Birth: 10-21-1940  Subjective/Objective:                 Admitted with SBO and UTI. Pt from home, lives with his wife. He reports being ind with ADL's. He has PCP, transportation and insurance with drug coverage. He has RW he uses. He has suprapubic catheter. He does not feel HH would be beneficial at DC. He communicates no needs or concerns about DC.    Action/Plan: DC home with self care. CM will follow to DC.   Expected Discharge Date:     07/16/2017             Expected Discharge Plan:  Home/Self Care  In-House Referral:  NA  Discharge planning Services  CM Consult  Post Acute Care Choice:  NA Choice offered to:  NA  Status of Service:  In process, will continue to follow  Sherald Barge, RN 07/15/2017, 10:24 AM

## 2017-07-15 NOTE — Progress Notes (Signed)
Subjective: He had more abdominal pain last night. He's not having any more diarrhea and in fact his bowels have not moved. He is still having leakage around his catheter site periodically. He is a little bit nauseated. He still has some abdominal pain.  Objective: Vital signs in last 24 hours: Temp:  [97.7 F (36.5 C)-98.3 F (36.8 C)] 98.3 F (36.8 C) (10/17 0500) Pulse Rate:  [58-64] 63 (10/17 0500) Resp:  [18] 18 (10/17 0500) BP: (136-181)/(60-79) 164/79 (10/17 0500) SpO2:  [93 %-99 %] 93 % (10/17 0500) Weight:  [59.6 kg (131 lb 8 oz)] 59.6 kg (131 lb 8 oz) (10/17 0500) Weight change: 0.816 kg (1 lb 12.8 oz) Last BM Date: 07/14/17  Intake/Output from previous day: 10/16 0701 - 10/17 0700 In: 1220 [P.O.:720; IV Piggyback:500] Out: 1425 [Urine:1425]  PHYSICAL EXAM General appearance: alert, cooperative and mild distress Resp: rhonchi bilaterally Cardio: regular rate and rhythm, S1, S2 normal, no murmur, click, rub or gallop GI: Abdomen is moderately tender and the hernia in the ventral area is easily reduced Extremities: extremities normal, atraumatic, no cyanosis or edema Suprapubic catheter appears to be in place  Lab Results:  Results for orders placed or performed during the hospital encounter of 07/10/17 (from the past 48 hour(s))  CBC with Differential/Platelet     Status: Abnormal   Collection Time: 07/14/17  6:06 AM  Result Value Ref Range   WBC 6.8 4.0 - 10.5 K/uL   RBC 4.00 (L) 4.22 - 5.81 MIL/uL   Hemoglobin 12.2 (L) 13.0 - 17.0 g/dL   HCT 37.0 (L) 39.0 - 52.0 %   MCV 92.5 78.0 - 100.0 fL   MCH 30.5 26.0 - 34.0 pg   MCHC 33.0 30.0 - 36.0 g/dL   RDW 13.4 11.5 - 15.5 %   Platelets 251 150 - 400 K/uL   Neutrophils Relative % 52 %   Neutro Abs 3.5 1.7 - 7.7 K/uL   Lymphocytes Relative 36 %   Lymphs Abs 2.5 0.7 - 4.0 K/uL   Monocytes Relative 8 %   Monocytes Absolute 0.5 0.1 - 1.0 K/uL   Eosinophils Relative 4 %   Eosinophils Absolute 0.3 0.0 - 0.7 K/uL    Basophils Relative 0 %   Basophils Absolute 0.0 0.0 - 0.1 K/uL  Basic metabolic panel     Status: Abnormal   Collection Time: 07/14/17  6:06 AM  Result Value Ref Range   Sodium 135 135 - 145 mmol/L   Potassium 3.0 (L) 3.5 - 5.1 mmol/L   Chloride 98 (L) 101 - 111 mmol/L   CO2 27 22 - 32 mmol/L   Glucose, Bld 92 65 - 99 mg/dL   BUN 15 6 - 20 mg/dL   Creatinine, Ser 1.41 (H) 0.61 - 1.24 mg/dL   Calcium 8.3 (L) 8.9 - 10.3 mg/dL   GFR calc non Af Amer 48 (L) >60 mL/min   GFR calc Af Amer 55 (L) >60 mL/min    Comment: (NOTE) The eGFR has been calculated using the CKD EPI equation. This calculation has not been validated in all clinical situations. eGFR's persistently <60 mL/min signify possible Chronic Kidney Disease.    Anion gap 10 5 - 15  Basic metabolic panel     Status: Abnormal   Collection Time: 07/15/17  4:53 AM  Result Value Ref Range   Sodium 134 (L) 135 - 145 mmol/L   Potassium 3.5 3.5 - 5.1 mmol/L   Chloride 102 101 - 111 mmol/L  CO2 23 22 - 32 mmol/L   Glucose, Bld 93 65 - 99 mg/dL   BUN 18 6 - 20 mg/dL   Creatinine, Ser 1.66 (H) 0.61 - 1.24 mg/dL   Calcium 8.5 (L) 8.9 - 10.3 mg/dL   GFR calc non Af Amer 39 (L) >60 mL/min   GFR calc Af Amer 45 (L) >60 mL/min    Comment: (NOTE) The eGFR has been calculated using the CKD EPI equation. This calculation has not been validated in all clinical situations. eGFR's persistently <60 mL/min signify possible Chronic Kidney Disease.    Anion gap 9 5 - 15    ABGS No results for input(s): PHART, PO2ART, TCO2, HCO3 in the last 72 hours.  Invalid input(s): PCO2 CULTURES Recent Results (from the past 240 hour(s))  Blood culture (routine x 2)     Status: None (Preliminary result)   Collection Time: 07/11/17 12:30 AM  Result Value Ref Range Status   Specimen Description RIGHT ANTECUBITAL  Final   Special Requests   Final    BOTTLES DRAWN AEROBIC AND ANAEROBIC Blood Culture adequate volume   Culture NO GROWTH 4 DAYS  Final    Report Status PENDING  Incomplete  Blood culture (routine x 2)     Status: None (Preliminary result)   Collection Time: 07/11/17 12:45 AM  Result Value Ref Range Status   Specimen Description BLOOD RIGHT ARM  Final   Special Requests   Final    BOTTLES DRAWN AEROBIC ONLY Blood Culture adequate volume   Culture NO GROWTH 4 DAYS  Final   Report Status PENDING  Incomplete  Urine Culture     Status: Abnormal   Collection Time: 07/11/17 12:45 AM  Result Value Ref Range Status   Specimen Description URINE, CATHETERIZED  Final   Special Requests NONE  Final   Culture >=100,000 COLONIES/mL STENOTROPHOMONAS MALTOPHILIA (A)  Final   Report Status 07/13/2017 FINAL  Final   Organism ID, Bacteria STENOTROPHOMONAS MALTOPHILIA (A)  Final      Susceptibility   Stenotrophomonas maltophilia - MIC*    LEVOFLOXACIN 0.25 SENSITIVE Sensitive     TRIMETH/SULFA <=20 SENSITIVE Sensitive     * >=100,000 COLONIES/mL STENOTROPHOMONAS MALTOPHILIA   Studies/Results: No results found.  Medications:  Prior to Admission:  Prescriptions Prior to Admission  Medication Sig Dispense Refill Last Dose  . carvedilol (COREG) 6.25 MG tablet Take 1 tablet (6.25 mg total) by mouth 2 (two) times daily with a meal.   07/10/2017 at 800  . diazepam (VALIUM) 5 MG tablet TAKE 1 TABLET BY MOUTH TWICE A DAY 60 tablet 4 07/10/2017 at Unknown time  . HYDROcodone-acetaminophen (NORCO) 7.5-325 MG per tablet Take 1 tablet by mouth 2 (two) times daily. Max APAP 3 GM IN 24 HOURS FROM ALL SOURCES (Patient taking differently: Take 1 tablet by mouth 2 (two) times daily. May take up to 4 times daily as needed for pain) 60 tablet 0 07/10/2017 at Unknown time  . ibuprofen (ADVIL,MOTRIN) 200 MG tablet Take 200 mg by mouth every 6 (six) hours as needed for fever.   unknown  . levothyroxine (SYNTHROID, LEVOTHROID) 88 MCG tablet Take 88 mcg by mouth daily before breakfast.   07/10/2017 at Unknown time  . potassium chloride SA (K-DUR,KLOR-CON) 20  MEQ tablet Take 20 mEq by mouth daily.   07/10/2017 at Unknown time  . solifenacin (VESICARE) 10 MG tablet Take 10 mg by mouth daily as needed (for spasms).    unknown  . verapamil (CALAN) 40  MG tablet Take 40 mg by mouth 2 (two) times daily.   07/10/2017 at Unknown time  . predniSONE (STERAPRED UNI-PAK 21 TAB) 10 MG (21) TBPK tablet Take by package instructions (Patient not taking: Reported on 07/11/2017) 21 tablet 0 Completed Course at Unknown time   Scheduled: . carvedilol  6.25 mg Oral BID WC  . darifenacin  15 mg Oral Daily  . diazepam  5 mg Oral BID  . heparin  5,000 Units Subcutaneous Q8H  . levothyroxine  88 mcg Oral QAC breakfast  . potassium chloride  20 mEq Oral BID  . sulfamethoxazole-trimethoprim  1 tablet Oral Q12H  . verapamil  40 mg Oral BID   Continuous: . ondansetron (ZOFRAN) IV     QBH:[id number] **OR** acetaminophen, fentaNYL (SUBLIMAZE) injection, hydrALAZINE, HYDROcodone-acetaminophen, loperamide, ondansetron **OR** ondansetron (ZOFRAN) IV  Assesment: He was admitted with urinary tract infection. He is on oral medications now. He also had partial small bowel obstruction on admission and he had a lot more pain last night had been having diarrhea and has now not been able to have a bowel movement so there is concern that he may have more trouble with a partial small bowel obstruction. He has a ventral hernia that is easily reducible. He has multiple other medical problems including a chronic suprapubic catheter for neurogenic bladder. He has multiple sclerosis at baseline. He has COPD at baseline. Principal Problem:   UTI (urinary tract infection) Active Problems:   Multiple sclerosis (HCC)   HTN (hypertension), malignant   Chronic suprapubic catheter (HCC)   Hypothyroidism   CKD (chronic kidney disease), stage III (HCC)   Paroxysmal atrial tachycardia (HCC)   Partial small bowel obstruction (HCC)   Choledocholithiasis    Plan: He will have acute abdominal  series done. He will have urology consultation if they are available today. Although he is on oral antibiotics now with these developments I don't think it's safe to send him home until we are sure that he is okay    LOS: 4 days   Rama Mcclintock L 07/15/2017, 8:20 AM

## 2017-07-16 LAB — CULTURE, BLOOD (ROUTINE X 2)
CULTURE: NO GROWTH
Culture: NO GROWTH
SPECIAL REQUESTS: ADEQUATE
SPECIAL REQUESTS: ADEQUATE

## 2017-07-16 NOTE — Progress Notes (Signed)
Subjective: He says he feels better today. No abdominal pain. No nausea or vomiting. Help from urology is noted and appreciated.  Objective: Vital signs in last 24 hours: Temp:  [98.1 F (36.7 C)-98.5 F (36.9 C)] 98.1 F (36.7 C) (10/18 0500) Pulse Rate:  [51-63] 63 (10/18 0500) Resp:  [15-18] 15 (10/18 0500) BP: (155-170)/(50-94) 155/50 (10/18 0500) SpO2:  [92 %-97 %] 92 % (10/18 0500) Weight:  [58.3 kg (128 lb 8 oz)] 58.3 kg (128 lb 8 oz) (10/18 0500) Weight change: -1.361 kg (-3 lb) Last BM Date: 07/15/17  Intake/Output from previous day: 10/17 0701 - 10/18 0700 In: 720 [P.O.:720] Out: 1150 [Urine:1150]  PHYSICAL EXAM General appearance: alert, cooperative and no distress Resp: rhonchi bilaterally Cardio: regular rate and rhythm, S1, S2 normal, no murmur, click, rub or gallop GI: his abdomen is soft now. Bowel sounds are present. His hernia is easily reduciblehe has no tenderness Extremities: extremities normal, atraumatic, no cyanosis or edema suprapubic catheter in place.  Lab Results:  Results for orders placed or performed during the hospital encounter of 07/10/17 (from the past 48 hour(s))  Basic metabolic panel     Status: Abnormal   Collection Time: 07/15/17  4:53 AM  Result Value Ref Range   Sodium 134 (L) 135 - 145 mmol/L   Potassium 3.5 3.5 - 5.1 mmol/L   Chloride 102 101 - 111 mmol/L   CO2 23 22 - 32 mmol/L   Glucose, Bld 93 65 - 99 mg/dL   BUN 18 6 - 20 mg/dL   Creatinine, Ser 1.66 (H) 0.61 - 1.24 mg/dL   Calcium 8.5 (L) 8.9 - 10.3 mg/dL   GFR calc non Af Amer 39 (L) >60 mL/min   GFR calc Af Amer 45 (L) >60 mL/min    Comment: (NOTE) The eGFR has been calculated using the CKD EPI equation. This calculation has not been validated in all clinical situations. eGFR's persistently <60 mL/min signify possible Chronic Kidney Disease.    Anion gap 9 5 - 15    ABGS No results for input(s): PHART, PO2ART, TCO2, HCO3 in the last 72 hours.  Invalid  input(s): PCO2 CULTURES Recent Results (from the past 240 hour(s))  Blood culture (routine x 2)     Status: None   Collection Time: 07/11/17 12:30 AM  Result Value Ref Range Status   Specimen Description RIGHT ANTECUBITAL  Final   Special Requests   Final    BOTTLES DRAWN AEROBIC AND ANAEROBIC Blood Culture adequate volume   Culture NO GROWTH 5 DAYS  Final   Report Status 07/16/2017 FINAL  Final  Blood culture (routine x 2)     Status: None   Collection Time: 07/11/17 12:45 AM  Result Value Ref Range Status   Specimen Description BLOOD RIGHT ARM  Final   Special Requests   Final    BOTTLES DRAWN AEROBIC ONLY Blood Culture adequate volume   Culture NO GROWTH 5 DAYS  Final   Report Status 07/16/2017 FINAL  Final  Urine Culture     Status: Abnormal   Collection Time: 07/11/17 12:45 AM  Result Value Ref Range Status   Specimen Description URINE, CATHETERIZED  Final   Special Requests NONE  Final   Culture >=100,000 COLONIES/mL STENOTROPHOMONAS MALTOPHILIA (A)  Final   Report Status 07/13/2017 FINAL  Final   Organism ID, Bacteria STENOTROPHOMONAS MALTOPHILIA (A)  Final      Susceptibility   Stenotrophomonas maltophilia - MIC*    LEVOFLOXACIN 0.25 SENSITIVE Sensitive  TRIMETH/SULFA <=20 SENSITIVE Sensitive     * >=100,000 COLONIES/mL STENOTROPHOMONAS MALTOPHILIA   Studies/Results: Dg Abd 2 Views  Result Date: 07/15/2017 CLINICAL DATA:  Abdominal pain since last night.  Diarrhea. EXAM: ABDOMEN - 2 VIEW COMPARISON:  Single-view of the abdomen and CT abdomen and pelvis 07/11/2017. FINDINGS: NG tube is in place in good position. No free intraperitoneal air. Short air-fluid levels are seen in mildly distended loops of gas-filled small bowel. Cholecystectomy clips are noted. Calcification in the right upper quadrant correlates with a renal stone seen on CT IMPRESSION: Bowel gas pattern compatible small bowel obstruction. Electronically Signed   By: Inge Rise M.D.   On: 07/15/2017  09:58    Medications:  Prior to Admission:  Prescriptions Prior to Admission  Medication Sig Dispense Refill Last Dose  . carvedilol (COREG) 6.25 MG tablet Take 1 tablet (6.25 mg total) by mouth 2 (two) times daily with a meal.   07/10/2017 at 800  . diazepam (VALIUM) 5 MG tablet TAKE 1 TABLET BY MOUTH TWICE A DAY 60 tablet 4 07/10/2017 at Unknown time  . HYDROcodone-acetaminophen (NORCO) 7.5-325 MG per tablet Take 1 tablet by mouth 2 (two) times daily. Max APAP 3 GM IN 24 HOURS FROM ALL SOURCES (Patient taking differently: Take 1 tablet by mouth 2 (two) times daily. May take up to 4 times daily as needed for pain) 60 tablet 0 07/10/2017 at Unknown time  . ibuprofen (ADVIL,MOTRIN) 200 MG tablet Take 200 mg by mouth every 6 (six) hours as needed for fever.   unknown  . levothyroxine (SYNTHROID, LEVOTHROID) 88 MCG tablet Take 88 mcg by mouth daily before breakfast.   07/10/2017 at Unknown time  . potassium chloride SA (K-DUR,KLOR-CON) 20 MEQ tablet Take 20 mEq by mouth daily.   07/10/2017 at Unknown time  . solifenacin (VESICARE) 10 MG tablet Take 10 mg by mouth daily as needed (for spasms).    unknown  . verapamil (CALAN) 40 MG tablet Take 40 mg by mouth 2 (two) times daily.   07/10/2017 at Unknown time  . predniSONE (STERAPRED UNI-PAK 21 TAB) 10 MG (21) TBPK tablet Take by package instructions (Patient not taking: Reported on 07/11/2017) 21 tablet 0 Completed Course at Unknown time   Scheduled: . carvedilol  6.25 mg Oral BID WC  . darifenacin  15 mg Oral Daily  . diazepam  5 mg Oral BID  . heparin  5,000 Units Subcutaneous Q8H  . levothyroxine  88 mcg Oral QAC breakfast  . mirabegron ER  50 mg Oral Daily  . potassium chloride  20 mEq Oral BID  . sulfamethoxazole-trimethoprim  1 tablet Oral Q12H  . verapamil  40 mg Oral BID   Continuous: . ondansetron (ZOFRAN) IV     ZWC:[id number] **OR** acetaminophen, fentaNYL (SUBLIMAZE) injection, hydrALAZINE, HYDROcodone-acetaminophen,  loperamide, ondansetron **OR** ondansetron (ZOFRAN) IV  Assesment:he was admitted with UTI and he is on oral meds now. Additionally he had small bowel obstruction. He had more symptoms yesterday and I had him do another 2 view abdominal x-ray which I have personally reviewed and it was suggestive at least that he may have more trouble with obstruction. I switched him to liquid diet and he feels much better now. Principal Problem:   UTI (urinary tract infection) Active Problems:   Multiple sclerosis (HCC)   HTN (hypertension), malignant   Chronic suprapubic catheter (HCC)   Hypothyroidism   CKD (chronic kidney disease), stage III (HCC)   Paroxysmal atrial tachycardia (HCC)   Partial small  bowel obstruction ([place])   Choledocholithiasis    Plan:back to dysphagia 3 diet and see how he does and if he's doing well discharge home tomorrow    LOS: 5 days   Jaretssi Kraker L 07/16/2017, 8:47 AM

## 2017-07-17 MED ORDER — SULFAMETHOXAZOLE-TRIMETHOPRIM 800-160 MG PO TABS: 1.0000 | ORAL_TABLET | Freq: Two times a day (BID) | ORAL | 0 refills | Status: DC

## 2017-07-17 MED ORDER — BISACODYL 5 MG PO TBEC
5.0000 mg | DELAYED_RELEASE_TABLET | Freq: Every day | ORAL | Status: DC | PRN
  Administered 2017-07-17: 5 mg via ORAL
  Filled 2017-07-17: qty 1

## 2017-07-17 NOTE — Progress Notes (Addendum)
Subjective: He says he feels much better. He did well with his stomach and was able to eat solid food with no abdominal discomfort. He wants to go home  Objective: Vital signs in last 24 hours: Temp:  [97.9 F (36.6 C)-98.3 F (36.8 C)] 98.3 F (36.8 C) (10/19 0648) Pulse Rate:  [62-64] 63 (10/19 0648) Resp:  [17-18] 18 (10/19 0648) BP: (99-170)/(62-71) 161/71 (10/19 0648) SpO2:  [91 %-99 %] 94 % (10/19 0648) Weight:  [59.9 kg (132 lb 1.6 oz)] 59.9 kg (132 lb 1.6 oz) (10/19 0500) Weight change: 1.633 kg (3 lb 9.6 oz) Last BM Date: 07/15/17  Intake/Output from previous day: 10/18 0701 - 10/19 0700 In: 720 [P.O.:720] Out: 1150 [Urine:1150]  PHYSICAL EXAM General appearance: alert, cooperative and no distress Resp: rhonchi bilaterally Cardio: regular rate and rhythm, S1, S2 normal, no murmur, click, rub or gallop GI: His abdomen is soft with no tenderness. His hernia is easily reducible Extremities: extremities normal, atraumatic, no cyanosis or edema Skin turgor much better  Lab Results:  No results found for this or any previous visit (from the past 48 hour(s)).  ABGS No results for input(s): PHART, PO2ART, TCO2, HCO3 in the last 72 hours.  Invalid input(s): PCO2 CULTURES Recent Results (from the past 240 hour(s))  Blood culture (routine x 2)     Status: None   Collection Time: 07/11/17 12:30 AM  Result Value Ref Range Status   Specimen Description RIGHT ANTECUBITAL  Final   Special Requests   Final    BOTTLES DRAWN AEROBIC AND ANAEROBIC Blood Culture adequate volume   Culture NO GROWTH 5 DAYS  Final   Report Status 07/16/2017 FINAL  Final  Blood culture (routine x 2)     Status: None   Collection Time: 07/11/17 12:45 AM  Result Value Ref Range Status   Specimen Description BLOOD RIGHT ARM  Final   Special Requests   Final    BOTTLES DRAWN AEROBIC ONLY Blood Culture adequate volume   Culture NO GROWTH 5 DAYS  Final   Report Status 07/16/2017 FINAL  Final  Urine  Culture     Status: Abnormal   Collection Time: 07/11/17 12:45 AM  Result Value Ref Range Status   Specimen Description URINE, CATHETERIZED  Final   Special Requests NONE  Final   Culture >=100,000 COLONIES/mL STENOTROPHOMONAS MALTOPHILIA (A)  Final   Report Status 07/13/2017 FINAL  Final   Organism ID, Bacteria STENOTROPHOMONAS MALTOPHILIA (A)  Final      Susceptibility   Stenotrophomonas maltophilia - MIC*    LEVOFLOXACIN 0.25 SENSITIVE Sensitive     TRIMETH/SULFA <=20 SENSITIVE Sensitive     * >=100,000 COLONIES/mL STENOTROPHOMONAS MALTOPHILIA   Studies/Results: Dg Abd 2 Views  Result Date: 07/15/2017 CLINICAL DATA:  Abdominal pain since last night.  Diarrhea. EXAM: ABDOMEN - 2 VIEW COMPARISON:  Single-view of the abdomen and CT abdomen and pelvis 07/11/2017. FINDINGS: NG tube is in place in good position. No free intraperitoneal air. Short air-fluid levels are seen in mildly distended loops of gas-filled small bowel. Cholecystectomy clips are noted. Calcification in the right upper quadrant correlates with a renal stone seen on CT IMPRESSION: Bowel gas pattern compatible small bowel obstruction. Electronically Signed   By: Inge Rise M.D.   On: 07/15/2017 09:58    Medications:  Prior to Admission:  Prescriptions Prior to Admission  Medication Sig Dispense Refill Last Dose  . carvedilol (COREG) 6.25 MG tablet Take 1 tablet (6.25 mg total) by mouth 2 (  two) times daily with a meal.   07/10/2017 at 800  . diazepam (VALIUM) 5 MG tablet TAKE 1 TABLET BY MOUTH TWICE A DAY 60 tablet 4 07/10/2017 at Unknown time  . HYDROcodone-acetaminophen (NORCO) 7.5-325 MG per tablet Take 1 tablet by mouth 2 (two) times daily. Max APAP 3 GM IN 24 HOURS FROM ALL SOURCES (Patient taking differently: Take 1 tablet by mouth 2 (two) times daily. May take up to 4 times daily as needed for pain) 60 tablet 0 07/10/2017 at Unknown time  . ibuprofen (ADVIL,MOTRIN) 200 MG tablet Take 200 mg by mouth every 6  (six) hours as needed for fever.   unknown  . levothyroxine (SYNTHROID, LEVOTHROID) 88 MCG tablet Take 88 mcg by mouth daily before breakfast.   07/10/2017 at Unknown time  . potassium chloride SA (K-DUR,KLOR-CON) 20 MEQ tablet Take 20 mEq by mouth daily.   07/10/2017 at Unknown time  . solifenacin (VESICARE) 10 MG tablet Take 10 mg by mouth daily as needed (for spasms).    unknown  . verapamil (CALAN) 40 MG tablet Take 40 mg by mouth 2 (two) times daily.   07/10/2017 at Unknown time  . predniSONE (STERAPRED UNI-PAK 21 TAB) 10 MG (21) TBPK tablet Take by package instructions (Patient not taking: Reported on 07/11/2017) 21 tablet 0 Completed Course at Unknown time   Scheduled: . carvedilol  6.25 mg Oral BID WC  . darifenacin  15 mg Oral Daily  . diazepam  5 mg Oral BID  . heparin  5,000 Units Subcutaneous Q8H  . levothyroxine  88 mcg Oral QAC breakfast  . mirabegron ER  50 mg Oral Daily  . potassium chloride  20 mEq Oral BID  . sulfamethoxazole-trimethoprim  1 tablet Oral Q12H  . verapamil  40 mg Oral BID   Continuous: . ondansetron (ZOFRAN) IV     GBE:[id number] **OR** acetaminophen, fentaNYL (SUBLIMAZE) injection, hydrALAZINE, HYDROcodone-acetaminophen, loperamide, ondansetron **OR** ondansetron (ZOFRAN) IV  Assesment: He was admitted with urinary tract infection. He is being treated with Bactrim now. He also had small bowel obstruction and that has resolved. At baseline he has chronic urinary retention with a chronic suprapubic catheter. He has chronic kidney disease stage III. He has multiple sclerosis which has limited his ability to ambulate. He has COPD and at one time had a diagnosis of pulmonary fibrosis but I do not see any evidence of that on his chest x-ray now. Chest x-ray on admission was read as showing bilateral airspace consolidation but clinically he does not appear to have pneumonia and the Bactrim should help that as well.When I look at the feeling now I think it may be  more from low lung volumes from his bowel obstruction but he will have repeat chest x-ray as an outpatient to be sure this clears Principal Problem:   UTI (urinary tract infection) Active Problems:   Multiple sclerosis (HCC)   HTN (hypertension), malignant   Chronic suprapubic catheter (HCC)   Hypothyroidism   CKD (chronic kidney disease), stage III (HCC)   Paroxysmal atrial tachycardia (HCC)   Partial small bowel obstruction ([place])   Choledocholithiasis    Plan: Discharge home today    LOS: 6 days   Theia Dezeeuw L 07/17/2017, 8:34 AM

## 2017-07-17 NOTE — Care Management Important Message (Signed)
Important Message  Patient Details  Name: Jordan Ward MRN: [id number] Date of Birth: 06-12-1941   Medicare Important Message Given:  Yes    Sherald Barge, RN 07/17/2017, 12:38 PM

## 2017-07-17 NOTE — Discharge Summary (Signed)
Physician Discharge Summary  Patient ID: Jordan Ward MRN: [id number] DOB/AGE: 1941/02/27 76 y.o. Primary Care Physician:Jodeci Roarty, Percell Miller, MD Admit date: 07/10/2017 Discharge date: 07/17/2017    Discharge Diagnoses:   Principal Problem:   UTI (urinary tract infection) Active Problems:   Multiple sclerosis (HCC)   HTN (hypertension), malignant   Chronic suprapubic catheter (HCC)   Hypothyroidism   CKD (chronic kidney disease), stage III (HCC)   Paroxysmal atrial tachycardia (HCC)   Partial small bowel obstruction (HCC)   Choledocholithiasis   Allergies as of 07/17/2017      Reactions   Tetracyclines & Related Anaphylaxis, Rash   Ciprofloxacin    Trouble swallowing unknown reaction according to wife       Medication List    TAKE these medications   carvedilol 6.25 MG tablet Commonly known as:  COREG Take 1 tablet (6.25 mg total) by mouth 2 (two) times daily with a meal.   diazepam 5 MG tablet Commonly known as:  VALIUM TAKE 1 TABLET BY MOUTH TWICE A DAY   HYDROcodone-acetaminophen 7.5-325 MG tablet Commonly known as:  NORCO Take 1 tablet by mouth 2 (two) times daily. Max APAP 3 GM IN 24 HOURS FROM ALL SOURCES What changed:  additional instructions   ibuprofen 200 MG tablet Commonly known as:  ADVIL,MOTRIN Take 200 mg by mouth every 6 (six) hours as needed for fever.   levothyroxine 88 MCG tablet Commonly known as:  SYNTHROID, LEVOTHROID Take 88 mcg by mouth daily before breakfast.   potassium chloride SA 20 MEQ tablet Commonly known as:  K-DUR,KLOR-CON Take 20 mEq by mouth daily.   predniSONE 10 MG (21) Tbpk tablet Commonly known as:  STERAPRED UNI-PAK 21 TAB Take by package instructions   solifenacin 10 MG tablet Commonly known as:  VESICARE Take 10 mg by mouth daily as needed (for spasms).   sulfamethoxazole-trimethoprim 800-160 MG tablet Commonly known as:  BACTRIM DS,SEPTRA DS Take 1 tablet by mouth every 12 (twelve) hours.   verapamil 40 MG  tablet Commonly known as:  CALAN Take 40 mg by mouth 2 (two) times daily.       Discharged Condition: Improved    Consults: Gen. surgery Dr. Bridges/urology Dr. Alyson Ingles  Significant Diagnostic Studies: Dg Chest 1 View  Result Date: 07/11/2017 CLINICAL DATA:  NG tube placement. EXAM: CHEST 1 VIEW COMPARISON:  07/11/2017 FINDINGS: Enteric catheter tip overlies the central lower thorax. Cardiomediastinal silhouette is normal. Calcific atherosclerotic disease and tortuosity of the aorta. There is no evidence of pneumothorax. Bilateral, left greater than right, lower thorax airspace consolidation. Osseous structures are without acute abnormality. Soft tissues are grossly normal. IMPRESSION: Enteric catheter overlies the central lower thorax. Interval development of bilateral lower lungs airspace consolidation, left greater than right. Electronically Signed   By: Fidela Salisbury M.D.   On: 07/11/2017 12:29   Dg Chest 2 View  Result Date: 07/11/2017 CLINICAL DATA:  Dysuria today. Recent antibiotic treatment for urinary tract infection. Fever. EXAM: CHEST  2 VIEW COMPARISON:  06/14/2017 FINDINGS: Emphysematous disease and chronic appearing interstitial coarsening persist without significant interval change. No confluent airspace consolidation. No effusions. Normal heart size. Unremarkable hilar and mediastinal contours. Normal pulmonary vasculature. IMPRESSION: Chronic emphysematous and fibrotic changes. No airspace consolidation. No effusion. Electronically Signed   By: Andreas Newport M.D.   On: 07/11/2017 01:30   Dg Abd 1 View  Result Date: 07/11/2017 CLINICAL DATA:  Status post NG tube placement. EXAM: ABDOMEN - 1 VIEW COMPARISON:  CT abdomen pelvis 07/11/2017  FINDINGS: NG tube is in place with the tip and side-port in the stomach. No acute abnormality. IMPRESSION: NG tube in good position. Electronically Signed   By: Inge Rise M.D.   On: 07/11/2017 13:29   Dg Abd 2  Views  Result Date: 07/15/2017 CLINICAL DATA:  Abdominal pain since last night.  Diarrhea. EXAM: ABDOMEN - 2 VIEW COMPARISON:  Single-view of the abdomen and CT abdomen and pelvis 07/11/2017. FINDINGS: NG tube is in place in good position. No free intraperitoneal air. Short air-fluid levels are seen in mildly distended loops of gas-filled small bowel. Cholecystectomy clips are noted. Calcification in the right upper quadrant correlates with a renal stone seen on CT IMPRESSION: Bowel gas pattern compatible small bowel obstruction. Electronically Signed   By: Inge Rise M.D.   On: 07/15/2017 09:58   Ct Renal Stone Study  Result Date: 07/11/2017 CLINICAL DATA:  Acute onset of dysuria and difficulty urinating. Initial encounter. EXAM: CT ABDOMEN AND PELVIS WITHOUT CONTRAST TECHNIQUE: Multidetector CT imaging of the abdomen and pelvis was performed following the standard protocol without IV contrast. COMPARISON:  CT of the abdomen and pelvis performed 06/14/2017 FINDINGS: Lower chest: Diffuse coronary artery calcifications are seen. Scarring and nodularity are noted at the right lung base. Hepatobiliary: The liver is unremarkable in appearance. The patient is status post cholecystectomy, with clips noted at the gallbladder fossa. There is dilatation of the common bile duct to 1.5 cm, with an apparent obstructing 1.4 cm stone at the level of the pancreatic head. Trace ascites is noted tracking about the liver. Pancreas: The pancreas is within normal limits. Spleen: The spleen is unremarkable in appearance. Adrenals/Urinary Tract: The adrenal glands are unremarkable in appearance. There is severe right renal atrophy. Minimal left-sided hydronephrosis is noted. There is diffuse Mittelstaedt thickening at the left renal pelvis and proximal left ureter, raising question for left-sided ureteritis. Bilateral perinephric stranding is noted. Bilateral renal cysts are seen. A nonobstructing 6 mm stone is noted at the lower  pole of the right kidney. Stomach/Bowel: The stomach is unremarkable in appearance. There is dilatation of small-bowel loops up to 4.9 cm in maximal diameter, with mild fecalization, just proximal to a relatively large anterior abdominal Wagenaar hernia. There is herniation of multiple small bowel loops into the hernia. Mild associated mesenteric edema is noted. This is mildly worsened from September, and concerning for partial small bowel obstruction or significant dysmotility secondary to the hernia. Small-bowel loops within the pelvis demonstrate mild diffuse Hodapp thickening, concerning for ileitis. This is new from September. A small amount of free fluid is noted within the pelvis. The patient is status post appendectomy. Postoperative change is noted at the proximal sigmoid colon. The remaining colon is unremarkable in appearance. Vascular/Lymphatic: Diffuse calcification is seen along the abdominal aorta and its branches. There is marked narrowing of the distal abdominal aorta, raising concern for severe luminal narrowing or occlusion. There is associated diffuse narrowing of the common, internal and external iliac arteries. The inferior vena cava is grossly unremarkable. No retroperitoneal lymphadenopathy is seen. No pelvic sidewall lymphadenopathy is identified. Reproductive: The bladder is relatively decompressed. A suprapubic catheter is noted. Bladder Akram thickening could reflect cystitis. The prostate is normal in size. Other: No additional soft tissue abnormalities are seen. Musculoskeletal: No acute osseous abnormalities are identified. Vacuum phenomenon is noted at L5-S1. The visualized musculature is unremarkable in appearance. IMPRESSION: 1. Bladder Gangl thickening could reflect cystitis. 2. Small-bowel loops within the pelvis demonstrate mild diffuse Radu thickening,  concerning for ileitis. This is new from September. 3. Dilatation of small-bowel loops up to 4.9 cm in maximal diameter, with mild  fecalization, just proximal to a relatively large anterior abdominal Schechter hernia. Herniation of multiple small bowel loops into the hernia. Mild associated mesenteric edema. This is mildly worsened from September, and concerning for partial small bowel obstruction or significant dysmotility secondary to the hernia. 4. Diffuse Lavin thickening at the left renal pelvis and proximal left ureter, raising concern for left-sided ureteritis. Minimal left-sided hydronephrosis noted. 5. Distention of the common bile duct to 1.5 cm. Apparent obstructing 1.4 cm stone at the level of the pancreatic head. This stone has progressed distally since September. 6. Trace ascites within the abdomen and pelvis. 7. Diffuse coronary artery calcifications. 8. Severe right renal atrophy. A 6 mm nonobstructing stone at the lower pole of the right kidney. 9. Diffuse aortic atherosclerosis. Marked narrowing of the distal abdominal aorta, raising concern for severe luminal narrowing or occlusion. Associated diffuse narrowing of the common, internal and external iliac arteries. Electronically Signed   By: Garald Balding M.D.   On: 07/11/2017 01:53    Lab Results: Basic Metabolic Panel:  Recent Labs  07/15/17 0453  NA 134*  K 3.5  CL 102  CO2 23  GLUCOSE 93  BUN 18  CREATININE 1.66*  CALCIUM 8.5*   Liver Function Tests: No results for input(s): AST, ALT, ALKPHOS, BILITOT, PROT, ALBUMIN in the last 72 hours.   CBC: No results for input(s): WBC, NEUTROABS, HGB, HCT, MCV, PLT in the last 72 hours.  Recent Results (from the past 240 hour(s))  Blood culture (routine x 2)     Status: None   Collection Time: 07/11/17 12:30 AM  Result Value Ref Range Status   Specimen Description RIGHT ANTECUBITAL  Final   Special Requests   Final    BOTTLES DRAWN AEROBIC AND ANAEROBIC Blood Culture adequate volume   Culture NO GROWTH 5 DAYS  Final   Report Status 07/16/2017 FINAL  Final  Blood culture (routine x 2)     Status: None    Collection Time: 07/11/17 12:45 AM  Result Value Ref Range Status   Specimen Description BLOOD RIGHT ARM  Final   Special Requests   Final    BOTTLES DRAWN AEROBIC ONLY Blood Culture adequate volume   Culture NO GROWTH 5 DAYS  Final   Report Status 07/16/2017 FINAL  Final  Urine Culture     Status: Abnormal   Collection Time: 07/11/17 12:45 AM  Result Value Ref Range Status   Specimen Description URINE, CATHETERIZED  Final   Special Requests NONE  Final   Culture >=100,000 COLONIES/mL STENOTROPHOMONAS MALTOPHILIA (A)  Final   Report Status 07/13/2017 FINAL  Final   Organism ID, Bacteria STENOTROPHOMONAS MALTOPHILIA (A)  Final      Susceptibility   Stenotrophomonas maltophilia - MIC*    LEVOFLOXACIN 0.25 SENSITIVE Sensitive     TRIMETH/SULFA <=20 SENSITIVE Sensitive     * >=100,000 COLONIES/mL STENOTROPHOMONAS MALTOPHILIA     Hospital Course: This is a 76 year old with multiple medical problems who came to my office on the day prior to admission with abdominal discomfort. He had what looked like a urinary tract infection. He was started on Levaquin but continued to have increasing abdominal discomfort and came to the emergency department. He he did have a urinary tract infection but also had what looked like small bowel obstruction. He had nasogastric tube placed and surgical consultation. He did not  require surgery. He had some more problems with abdominal pain before discharge but by the time of discharge she was able to eat solid food was much better as far as his abdomen was concerned and his symptoms of urinary tract infection were improving. He has a chronic suprapubic catheter for chronic urinary retention and that was likely the cause of his urinary tract infection.  Discharge Exam: Blood pressure (!) 161/71, pulse 63, temperature 98.3 F (36.8 C), temperature source Oral, resp. rate 18, height 5\' 5"  (1.651 m), weight 59.9 kg (132 lb 1.6 oz), SpO2 94 %. He's awake and alert. Much  improved. Abdomen is soft.  Disposition: Home he does not want home health services      Signed: Janoah Menna L   07/17/2017, 8:45 AM

## 2017-07-17 NOTE — Care Management Note (Signed)
Case Management Note  Patient Details  Name: Jordan Ward MRN: [id number] Date of Birth: August 31, 1941  Expected Discharge Date:  07/17/17               Expected Discharge Plan:  Home/Self Care  In-House Referral:  NA  Discharge planning Services  CM Consult  Post Acute Care Choice:  NA Choice offered to:  NA  Status of Service:  Completed, signed off  Additional Comments: DC home today, No CM needs at DC.   Sherald Barge, RN 07/17/2017, 12:51 PM

## 2017-07-17 NOTE — Progress Notes (Signed)
Patient is to be discharged home and in stable condition. Midline removed, WNL. Patient given discharge instructions and verbalized understanding. Patient awaiting transport. Upon arrival, patient will be escorted out by staff via wheelchair.  Celestia Khat, RN

## 2017-07-21 ENCOUNTER — Ambulatory Visit (INDEPENDENT_AMBULATORY_CARE_PROVIDER_SITE_OTHER): Payer: Medicare Other | Admitting: Urology

## 2017-07-21 DIAGNOSIS — N189 Chronic kidney disease, unspecified: Secondary | ICD-10-CM | POA: Diagnosis not present

## 2017-07-21 DIAGNOSIS — I10 Essential (primary) hypertension: Secondary | ICD-10-CM | POA: Diagnosis not present

## 2017-07-21 DIAGNOSIS — N201 Calculus of ureter: Secondary | ICD-10-CM | POA: Diagnosis not present

## 2017-07-21 DIAGNOSIS — G35 Multiple sclerosis: Secondary | ICD-10-CM | POA: Diagnosis not present

## 2017-07-21 DIAGNOSIS — K56609 Unspecified intestinal obstruction, unspecified as to partial versus complete obstruction: Secondary | ICD-10-CM | POA: Diagnosis not present

## 2017-08-11 ENCOUNTER — Ambulatory Visit (INDEPENDENT_AMBULATORY_CARE_PROVIDER_SITE_OTHER): Payer: Medicare Other | Admitting: Urology

## 2017-08-11 DIAGNOSIS — N201 Calculus of ureter: Secondary | ICD-10-CM | POA: Diagnosis not present

## 2017-08-13 ENCOUNTER — Ambulatory Visit (INDEPENDENT_AMBULATORY_CARE_PROVIDER_SITE_OTHER): Payer: Medicare Other | Admitting: Adult Health

## 2017-08-13 ENCOUNTER — Encounter: Payer: Self-pay | Admitting: Adult Health

## 2017-08-13 VITALS — BP 125/68 | HR 59 | Wt 136.4 lb

## 2017-08-13 DIAGNOSIS — F419 Anxiety disorder, unspecified: Secondary | ICD-10-CM

## 2017-08-13 DIAGNOSIS — R519 Headache, unspecified: Secondary | ICD-10-CM

## 2017-08-13 DIAGNOSIS — R269 Unspecified abnormalities of gait and mobility: Secondary | ICD-10-CM

## 2017-08-13 DIAGNOSIS — R51 Headache: Secondary | ICD-10-CM

## 2017-08-13 MED ORDER — DIAZEPAM 5 MG PO TABS: 5.0000 mg | ORAL_TABLET | Freq: Two times a day (BID) | ORAL | 5 refills | Status: DC

## 2017-08-13 NOTE — Progress Notes (Signed)
Diazepam refill Rx successfully faxed to [facility], [place].

## 2017-08-13 NOTE — Patient Instructions (Signed)
Your Plan:  Continue Valium  If your symptoms worsen or you develop new symptoms please let us know.   Thank you for coming to see [place] at [facility]. I hope we have been able to provide you high quality care today.  You may receive a patient satisfaction survey over the next few weeks. We would appreciate your feedback and comments so that we may continue to improve ourselves and the health of our patients.

## 2017-08-13 NOTE — Progress Notes (Signed)
PATIENT: Jordan Ward DOB: June 09, 1941  REASON FOR VISIT: follow up HISTORY FROM: patient  HISTORY OF PRESENT ILLNESS: Today 08/13/17 Jordan Ward is a 76 year old male with a history of cerebrovascular disease, gait disturbance headaches.  He returns today for follow-up.  Overall he is doing well.  He states that he continues to use diazepam for his anxiety.  He feels that this offers him good benefit.  He uses a walker when ambulating.  Reports that he tends to go without it when he is at home.  He denies any recent falls.  He reports that his headaches are under relatively good control.  Frequency  varies.  The patient reports that he has been having more trouble with his suprapubic catheter.  Reports that he has had multiple infections and been in and out of the hospital.  HISTORY Jordan Ward is a 76 year old male with a history of cerebrovascular disease. He returns today for follow-up. Overall he feels that he is doing well. He continues to have a suprapubic catheter. He states more recently he had a kidney stone that had to be crushed. Denies any changes with his swallowing. He states he is no longer on thickened liquids. He is careful about what he eats. For example he cannot swallow chips, peanuts and candy. Denies any falls. He is currently using a walker. He states that his headaches are under good control. He has not had but one headache in the last 6 months. He continues to use Valium for anxiety. Denies any new neurological symptoms. He returns today for an evaluation.  HISTORY 07/18/15: Jordan Ward is a 76 year old right-handed white male with a history of cerebrovascular disease, some question of multiple sclerosis in the past. The patient has extensive white matter changes on MRI of the brain, and he has a chronic gait disorder associated with this. The patient also has some problems with dysphagia, and a neurogenic bladder. The patient has an indwelling catheter, and he has intermittent  problems with urinary tract infections. He has been seen by speech therapy previously for dysphagia, he is on thickened liquids, and he is doing well with this. The patient has to crush his medications in order to take them. The patient uses a walker for ambulation, he is doing fairly well with this, he has not had any falls since last year. The wife indicates that his ability to ambulate has actually been better recently than it has been in several years. He does have headaches off and on, he may have 2 or 3 headaches a month, he is able to get improvement in the headaches quickly. He returns to the office today for an evaluation.   REVIEW OF SYSTEMS: Out of a complete 14 system review of symptoms, the patient complains only of the following symptoms, and all other reviewed systems are negative.  Joint pain, back pain, walking difficulty, numbness, speech difficulty, frequent infections patient, apnea, blurred vision, runny nose  ALLERGIES: Allergies  Allergen Reactions  . Tetracyclines & Related Anaphylaxis and Rash  . Ciprofloxacin     Trouble swallowing unknown reaction according to wife     HOME MEDICATIONS: Outpatient Medications Prior to Visit  Medication Sig Dispense Refill  . carvedilol (COREG) 6.25 MG tablet Take 1 tablet (6.25 mg total) by mouth 2 (two) times daily with a meal.    . diazepam (VALIUM) 5 MG tablet TAKE 1 TABLET BY MOUTH TWICE A DAY 60 tablet 4  . HYDROcodone-acetaminophen (NORCO) 7.5-325 MG per tablet  Take 1 tablet by mouth 2 (two) times daily. Max APAP 3 GM IN 24 HOURS FROM ALL SOURCES (Patient taking differently: Take 1 tablet by mouth 2 (two) times daily. May take up to 4 times daily as needed for pain) 60 tablet 0  . ibuprofen (ADVIL,MOTRIN) 200 MG tablet Take 200 mg by mouth every 6 (six) hours as needed for fever.    . levothyroxine (SYNTHROID, LEVOTHROID) 88 MCG tablet Take 88 mcg by mouth daily before breakfast.    . potassium chloride SA (K-DUR,KLOR-CON) 20  MEQ tablet Take 20 mEq by mouth daily.    . solifenacin (VESICARE) 10 MG tablet Take 10 mg by mouth daily as needed (for spasms).     Marland Kitchen sulfamethoxazole-trimethoprim (BACTRIM DS,SEPTRA DS) 800-160 MG tablet Take 1 tablet by mouth every 12 (twelve) hours. 14 tablet 0  . verapamil (CALAN) 40 MG tablet Take 40 mg by mouth 2 (two) times daily.    . predniSONE (STERAPRED UNI-PAK 21 TAB) 10 MG (21) TBPK tablet Take by package instructions (Patient not taking: Reported on 07/11/2017) 21 tablet 0   No facility-administered medications prior to visit.     PAST MEDICAL HISTORY: Past Medical History:  Diagnosis Date  . Anemia   . Arthritis   . Back pain, chronic   . Bilateral carotid bruits   . C. difficile colitis 09/2011  . CAD (coronary artery disease)   . Carotid artery stenosis   . Cerebrovascular disease   . Colon polyps   . Dyslipidemia   . Dysphagia 10/07/2011  . Encephalopathy   . Gait disorder   . HA (headache)   . High grade dysplasia in colonic adenoma 09/2005  . History of kidney stones   . HTN (hypertension), malignant 10/06/2011  . Hypernatremia   . Hypokalemia   . Hypothyroidism 10/08/2011  . Insomnia   . Junctional rhythm   . Kidney stones   . MS (multiple sclerosis) ([place])   . Neuromuscular disorder ([place])   . OSA (obstructive sleep apnea)   . Paroxysmal atrial tachycardia ([place])   . Peripheral vascular disease ([place])   . Pneumonia 4 yrs ago  . Pulmonary fibrosis ([place]) 10/06/2011  . Pulmonary nodule 10/08/2011  . PVD (peripheral vascular disease) ([place])   . Sacral ulcer ([place])   . Sleep apnea   . Stroke (Jordan Ward)   . TIA (transient ischemic attack)   . Tremors of nervous system 10/08/2011  . Urinary tract infection     PAST SURGICAL HISTORY: Past Surgical History:  Procedure Laterality Date  . APPENDECTOMY  09/2005   at time of left hemicolectomy  . BACK SURGERY  1976/1979   lower  . CHOLECYSTECTOMY     Dr. Tamala Julian  . COLON SURGERY  09/2005   F[place]ishman: four tubular adenomas,  large adenomatous polyp with HIGH GRADE dysplasia  . COLONOSCOPY  11/2004   Dr. Sharol Roussel sessile polyp splenic flexure, 11mm sessile polyp desc colon, tubulovillous adenoma (bx not removed)  . COLONOSCOPY  01/2005   poor prep, polyp could not be found  . COLONOSCOPY  05/2005   with EMR, polypectomy Dr. Olegario Messier, bx showed high grade dysplasia, partially resected  . COLONOSCOPY  09/2005   Dr. Arsenio Loader, [place] ink tattooing, four villous colon polyp (3 had been missed on previous colonoscopies due to limitations of procedures  . COLONOSCOPY  09/2006   normal TI, no polyps  . COLONOSCOPY  10/2007   Dr. Imogene Burn distal mammillations, benign bx, normal TI, random bx neg for  microscopic colitis  . CYSTOSCOPY WITH RETROGRADE PYELOGRAM, URETEROSCOPY AND STENT PLACEMENT Left 06/09/2017   Procedure: CYSTOSCOPY WITH LEFT RETROGRADE PYELOGRAM, LEFT URETEROSCOPY, LEFT URETEROSCOPIC STONE EXTRACTION, LEFT URETERAL STENT PLACEMENT;  Surgeon: Franchot Gallo, MD;  Location: [facility];  Service: Urology;  Laterality: Left;  . INGUINAL HERNIA REPAIR  1971   bilateral  . INSERTION OF SUPRAPUBIC CATHETER  06/09/2017   Procedure: EXCHANGE OF SUPRAPUBIC CATHETER;  Surgeon: Franchot Gallo, MD;  Location: [facility];  Service: Urology;;  . IR NEPHROSTOMY PLACEMENT LEFT  05/26/2017  . KIDNEY STONE SURGERY  09/13/2015  . NEPHROLITHOTOMY Left 09/13/2015   Procedure: LEFT PERCUTANEOUS NEPHROLITHOTOMY ;  Surgeon: Franchot Gallo, MD;  Location: [facility];  Service: Urology;  Laterality: Left;  . SUPRAPUBIC CATHETER INSERTION      FAMILY HISTORY: Family History  Problem Relation Age of Onset  . Cirrhosis Brother        etoh  . Stroke Mother 3  . Coronary artery disease Father 18  . Heart attack Brother   . Cancer Sister   . Multiple sclerosis Unknown   . Colon cancer Neg Hx     SOCIAL HISTORY: Social History   Socioeconomic History  . Marital status: Married    Spouse name: Pricilla Holm  . Number of  children: 2  . Years of education: 10  . Highest education level: Not on file  Social Needs  . Financial resource strain: Not on file  . Food insecurity - worry: Not on file  . Food insecurity - inability: Not on file  . Transportation needs - medical: Not on file  . Transportation needs - non-medical: Not on file  Occupational History  . Occupation: disability    Employer: RETIRED  Tobacco Use  . Smoking status: Former Smoker    Types: Cigarettes  . Smokeless tobacco: Never Used  . Tobacco comment: quit remote  Substance and Sexual Activity  . Alcohol use: No  . Drug use: No  . Sexual activity: No  Other Topics Concern  . Not on file  Social History Narrative   Patient is married Pricilla Holm) and  Lives w/ wife.   Patient is retired.   Patient has a 10th grade education.   Patient has two children.      Patient drinks about 1-2 sodas daily.   Patient is left handed.       PHYSICAL EXAM  Vitals:   08/13/17 1122  BP: 125/68  Pulse: (!) 59  Weight: 136 lb 6.4 oz (61.9 kg)   Body mass index is 22.7 kg/m.  Generalized: Well developed, in no acute distress   Neurological examination  Mentation: Alert oriented to time, place, history taking. Follows all commands speech and language fluent Cranial nerve II-XII: Pupils were equal round reactive to light. Extraocular movements were full, visual field were full on confrontational test. Facial sensation and strength were normal. Uvula tongue midline. Head turning and shoulder shrug  were normal and symmetric. Motor: The motor testing reveals 5 over 5 strength of all 4 extremities. Good symmetric motor tone is noted throughout.  Sensory: Sensory testing is intact to soft touch on all 4 extremities. No evidence of extinction is noted.  Coordination: Cerebellar testing reveals good finger-nose-finger and heel-to-shin bilaterally.  Gait and station: Patient uses a walker when ambulating.  Slight limp on the right.  Tandem gait not  attempted. Reflexes: Deep tendon reflexes are symmetric and normal bilaterally.   DIAGNOSTIC DATA (LABS, IMAGING, TESTING) - I reviewed patient  records, labs, notes, testing and imaging myself where available.  Lab Results  Component Value Date   WBC 6.8 07/14/2017   HGB 12.2 (L) 07/14/2017   HCT 37.0 (L) 07/14/2017   MCV 92.5 07/14/2017   PLT 251 07/14/2017      Component Value Date/Time   NA 134 (L) 07/15/2017 0453   K 3.5 07/15/2017 0453   CL 102 07/15/2017 0453   CO2 23 07/15/2017 0453   GLUCOSE 93 07/15/2017 0453   BUN 18 07/15/2017 0453   CREATININE 1.66 (H) 07/15/2017 0453   CALCIUM 8.5 (L) 07/15/2017 0453   PROT 6.8 07/11/2017 0742   ALBUMIN 3.3 (L) 07/11/2017 0742   AST 34 07/11/2017 0742   ALT 44 07/11/2017 0742   ALKPHOS 132 (H) 07/11/2017 0742   BILITOT 0.7 07/11/2017 0742   GFRNONAA 39 (L) 07/15/2017 0453   GFRAA 45 (L) 07/15/2017 0453   Lab Results  Component Value Date   CHOL 84 08/05/2015   HDL 19 (L) 08/05/2015   LDLCALC 46 08/05/2015   TRIG 94 08/05/2015   CHOLHDL 4.4 08/05/2015   No results found for: HGBA1C Lab Results  Component Value Date   VITAMINB12 556 10/07/2011   Lab Results  Component Value Date   TSH 2.077 08/05/2015      ASSESSMENT AND PLAN 76 y.o. year old male  has a past medical history of Anemia, Arthritis, Back pain, chronic, Bilateral carotid bruits, C. difficile colitis (09/2011), CAD (coronary artery disease), Carotid artery stenosis, Cerebrovascular disease, Colon polyps, Dyslipidemia, Dysphagia (10/07/2011), Encephalopathy, Gait disorder, HA (headache), High grade dysplasia in colonic adenoma (09/2005), History of kidney stones, HTN (hypertension), malignant (10/06/2011), Hypernatremia, Hypokalemia, Hypothyroidism (10/08/2011), Insomnia, Junctional rhythm, Kidney stones, MS (multiple sclerosis) ([place]), Neuromuscular disorder ([place]), OSA (obstructive sleep apnea), Paroxysmal atrial tachycardia ([place]), Peripheral vascular disease ([place]),  Pneumonia (4 yrs ago), Pulmonary fibrosis ([place]) (10/06/2011), Pulmonary nodule (10/08/2011), PVD (peripheral vascular disease) ([place]), Sacral ulcer ([place]), Sleep apnea, Stroke ([place]), TIA (transient ischemic attack), Tremors of nervous system (10/08/2011), and Urinary tract infection. here with:  1.  Gait disorder 2.  Headaches 3.  Anxiety  Overall the patient is doing well.  He will continue on diazepam for his anxiety.  He is encouraged to continue using a walker when ambulating.  If he begins to have frequent falls he should let us know.  His headaches are under relatively good control.  He is advised that if his symptoms worsen or he develops new symptoms he should let us know.  He will follow-up in 6 months or sooner if needed.     Ward Givens, MSN, NP-C 08/13/2017, 11:40 AM [facility] [address] [place], [place] [address] [phone] .megf.

## 2017-08-13 NOTE — Progress Notes (Signed)
I have read the note, and I agree with the clinical assessment and plan.  Javarian Jakubiak K Basma Buchner   

## 2017-09-01 ENCOUNTER — Ambulatory Visit (INDEPENDENT_AMBULATORY_CARE_PROVIDER_SITE_OTHER): Payer: Medicare Other | Admitting: Urology

## 2017-09-01 DIAGNOSIS — N201 Calculus of ureter: Secondary | ICD-10-CM | POA: Diagnosis not present

## 2017-09-25 ENCOUNTER — Ambulatory Visit (INDEPENDENT_AMBULATORY_CARE_PROVIDER_SITE_OTHER): Payer: Medicare Other | Admitting: Urology

## 2017-09-25 DIAGNOSIS — N131 Hydronephrosis with ureteral stricture, not elsewhere classified: Secondary | ICD-10-CM

## 2017-10-16 ENCOUNTER — Ambulatory Visit (INDEPENDENT_AMBULATORY_CARE_PROVIDER_SITE_OTHER): Payer: Medicare Other | Admitting: Urology

## 2017-10-16 DIAGNOSIS — N131 Hydronephrosis with ureteral stricture, not elsewhere classified: Secondary | ICD-10-CM

## 2017-10-22 DIAGNOSIS — Z79891 Long term (current) use of opiate analgesic: Secondary | ICD-10-CM | POA: Diagnosis not present

## 2017-11-10 ENCOUNTER — Ambulatory Visit (INDEPENDENT_AMBULATORY_CARE_PROVIDER_SITE_OTHER): Payer: Medicare Other | Admitting: Urology

## 2017-11-10 DIAGNOSIS — N312 Flaccid neuropathic bladder, not elsewhere classified: Secondary | ICD-10-CM

## 2017-11-20 DIAGNOSIS — H34832 Tributary (branch) retinal vein occlusion, left eye, with macular edema: Secondary | ICD-10-CM | POA: Diagnosis not present

## 2017-12-01 ENCOUNTER — Ambulatory Visit (INDEPENDENT_AMBULATORY_CARE_PROVIDER_SITE_OTHER): Payer: Medicare Other | Admitting: Urology

## 2017-12-01 DIAGNOSIS — N312 Flaccid neuropathic bladder, not elsewhere classified: Secondary | ICD-10-CM | POA: Diagnosis not present

## 2017-12-03 ENCOUNTER — Encounter (INDEPENDENT_AMBULATORY_CARE_PROVIDER_SITE_OTHER): Payer: Medicare Other | Admitting: Ophthalmology

## 2017-12-03 DIAGNOSIS — I10 Essential (primary) hypertension: Secondary | ICD-10-CM

## 2017-12-03 DIAGNOSIS — H43813 Vitreous degeneration, bilateral: Secondary | ICD-10-CM | POA: Diagnosis not present

## 2017-12-03 DIAGNOSIS — H2513 Age-related nuclear cataract, bilateral: Secondary | ICD-10-CM

## 2017-12-03 DIAGNOSIS — H35033 Hypertensive retinopathy, bilateral: Secondary | ICD-10-CM | POA: Diagnosis not present

## 2017-12-03 DIAGNOSIS — H34832 Tributary (branch) retinal vein occlusion, left eye, with macular edema: Secondary | ICD-10-CM

## 2017-12-22 ENCOUNTER — Ambulatory Visit (INDEPENDENT_AMBULATORY_CARE_PROVIDER_SITE_OTHER): Payer: Medicare Other | Admitting: Urology

## 2017-12-22 DIAGNOSIS — N201 Calculus of ureter: Secondary | ICD-10-CM

## 2017-12-31 ENCOUNTER — Encounter (INDEPENDENT_AMBULATORY_CARE_PROVIDER_SITE_OTHER): Payer: Medicare Other | Admitting: Ophthalmology

## 2017-12-31 DIAGNOSIS — H35033 Hypertensive retinopathy, bilateral: Secondary | ICD-10-CM

## 2017-12-31 DIAGNOSIS — H2513 Age-related nuclear cataract, bilateral: Secondary | ICD-10-CM | POA: Diagnosis not present

## 2017-12-31 DIAGNOSIS — H34832 Tributary (branch) retinal vein occlusion, left eye, with macular edema: Secondary | ICD-10-CM | POA: Diagnosis not present

## 2017-12-31 DIAGNOSIS — I10 Essential (primary) hypertension: Secondary | ICD-10-CM

## 2017-12-31 DIAGNOSIS — H43813 Vitreous degeneration, bilateral: Secondary | ICD-10-CM

## 2018-01-12 ENCOUNTER — Ambulatory Visit (INDEPENDENT_AMBULATORY_CARE_PROVIDER_SITE_OTHER): Payer: Medicare Other | Admitting: Urology

## 2018-01-12 DIAGNOSIS — N312 Flaccid neuropathic bladder, not elsewhere classified: Secondary | ICD-10-CM

## 2018-01-28 ENCOUNTER — Encounter (INDEPENDENT_AMBULATORY_CARE_PROVIDER_SITE_OTHER): Payer: Medicare Other | Admitting: Ophthalmology

## 2018-01-28 DIAGNOSIS — H34832 Tributary (branch) retinal vein occlusion, left eye, with macular edema: Secondary | ICD-10-CM

## 2018-01-28 DIAGNOSIS — I10 Essential (primary) hypertension: Secondary | ICD-10-CM

## 2018-01-28 DIAGNOSIS — H2513 Age-related nuclear cataract, bilateral: Secondary | ICD-10-CM | POA: Diagnosis not present

## 2018-01-28 DIAGNOSIS — H35033 Hypertensive retinopathy, bilateral: Secondary | ICD-10-CM | POA: Diagnosis not present

## 2018-01-28 DIAGNOSIS — H43813 Vitreous degeneration, bilateral: Secondary | ICD-10-CM

## 2018-02-05 ENCOUNTER — Ambulatory Visit (INDEPENDENT_AMBULATORY_CARE_PROVIDER_SITE_OTHER): Payer: Medicare Other | Admitting: Urology

## 2018-02-05 DIAGNOSIS — N312 Flaccid neuropathic bladder, not elsewhere classified: Secondary | ICD-10-CM | POA: Diagnosis not present

## 2018-02-11 ENCOUNTER — Other Ambulatory Visit: Payer: Self-pay | Admitting: Neurology

## 2018-02-24 ENCOUNTER — Encounter (INDEPENDENT_AMBULATORY_CARE_PROVIDER_SITE_OTHER): Payer: Medicare Other | Admitting: Ophthalmology

## 2018-02-24 DIAGNOSIS — H2513 Age-related nuclear cataract, bilateral: Secondary | ICD-10-CM | POA: Diagnosis not present

## 2018-02-24 DIAGNOSIS — H43813 Vitreous degeneration, bilateral: Secondary | ICD-10-CM

## 2018-02-24 DIAGNOSIS — I10 Essential (primary) hypertension: Secondary | ICD-10-CM | POA: Diagnosis not present

## 2018-02-24 DIAGNOSIS — H34832 Tributary (branch) retinal vein occlusion, left eye, with macular edema: Secondary | ICD-10-CM | POA: Diagnosis not present

## 2018-02-24 DIAGNOSIS — H35033 Hypertensive retinopathy, bilateral: Secondary | ICD-10-CM

## 2018-02-25 DIAGNOSIS — H25811 Combined forms of age-related cataract, right eye: Secondary | ICD-10-CM | POA: Diagnosis not present

## 2018-02-25 DIAGNOSIS — H25813 Combined forms of age-related cataract, bilateral: Secondary | ICD-10-CM | POA: Diagnosis not present

## 2018-02-26 ENCOUNTER — Ambulatory Visit (INDEPENDENT_AMBULATORY_CARE_PROVIDER_SITE_OTHER): Payer: Medicare Other | Admitting: Urology

## 2018-02-26 DIAGNOSIS — N201 Calculus of ureter: Secondary | ICD-10-CM

## 2018-03-01 DIAGNOSIS — J449 Chronic obstructive pulmonary disease, unspecified: Secondary | ICD-10-CM | POA: Diagnosis not present

## 2018-03-01 DIAGNOSIS — I10 Essential (primary) hypertension: Secondary | ICD-10-CM | POA: Diagnosis not present

## 2018-03-01 DIAGNOSIS — G35 Multiple sclerosis: Secondary | ICD-10-CM | POA: Diagnosis not present

## 2018-03-01 DIAGNOSIS — E039 Hypothyroidism, unspecified: Secondary | ICD-10-CM | POA: Diagnosis not present

## 2018-03-01 NOTE — Patient Instructions (Signed)
Jordan Ward  03/01/2018     @PREFPERIOPPHARMACY @   Your procedure is scheduled on 03/08/2018.  Report to [facility] at 7:00 A.M.  Call this number if you have problems the morning of surgery:  [phone]   Remember:  No food or drink after midnight.      Take these medicines the morning of surgery with A SIP OF WATER Coreg, Valium, Toviaz, Synthroid, Verapamil    Do not wear jewelry, make-up or nail polish.  Do not wear lotions, powders, or perfumes, or deodorant.  Do not shave 48 hours prior to surgery.  Men may shave face and neck.  Do not bring valuables to the hospital.  [facility] is not responsible for any belongings or valuables.  Contacts, dentures or bridgework may not be worn into surgery.  Leave your suitcase in the car.  After surgery it may be brought to your room.  For patients admitted to the hospital, discharge time will be determined by your treatment team.  Patients discharged the day of surgery will not be allowed to drive home.    Please read over the following fact sheets that you were given. Anesthesia Post-op Instructions     PATIENT INSTRUCTIONS POST-ANESTHESIA  IMMEDIATELY FOLLOWING SURGERY:  Do not drive or operate machinery for the first twenty four hours after surgery.  Do not make any important decisions for twenty four hours after surgery or while taking narcotic pain medications or sedatives.  If you develop intractable nausea and vomiting or a severe headache please notify your doctor immediately.  FOLLOW-UP:  Please make an appointment with your surgeon as instructed. You do not need to follow up with anesthesia unless specifically instructed to do so.  WOUND CARE INSTRUCTIONS (if applicable):  Keep a dry clean dressing on the anesthesia/puncture wound site if there is drainage.  Once the wound has quit draining you may leave it open to air.  Generally you should leave the bandage intact for twenty four hours unless there is drainage.   If the epidural site drains for more than 36-48 hours please call the anesthesia department.  QUESTIONS?:  Please feel free to call your physician or the hospital operator if you have any questions, and they will be happy to assist you.      Cataract Surgery Cataract surgery is a procedure to remove a cataract from your eye. A cataract is cloudiness on the lens of your eye. The lens focuses light inside the eye. When a lens becomes cloudy, your vision is affected. Cataract surgery is a procedure to remove the cloudy lens. A substitute lens (intraocular lens or IOL) is usually inserted as a replacement for the cloudy lens. Tell a health care provider about:  Any allergies you have.  All medicines you are taking, including vitamins, herbs, eye drops, creams, and over-the-counter medicines.  Any problems you or family members have had with anesthetic medicines.  Any blood disorders you have.  Any surgeries you have had, especially eye surgeries that include refractive surgery, such as PRK and LASIK.  Any medical conditions you have.  Whether you are pregnant or may be pregnant. What are the risks? Generally, this is a safe procedure. However, problems may occur, including:  Infection.  Bleeding.  Glaucoma.  Retinal detachment.  Allergic reactions to medicines.  Damage to other structures or organs.  Inflammation of the eye.  Clouding of the part of your eye that holds an IOL in place (after-cataract), if an IOL was  inserted. This is fairly common.  An IOL moving out of position, if an IOL was inserted. This is very rare.  Loss of vision. This is rare.  What happens before the procedure?  Follow instructions from your health care provider about eating or drinking restrictions.  Ask your health care provider about: ? Changing or stopping your regular medicines, including any eye drops you have been prescribed. This is especially important if you are taking diabetes  medicines or blood thinners. ? Taking medicines such as aspirin and ibuprofen. These medicines can thin your blood. Do not take these medicines before your procedure if your health care provider instructs you not to.  Do not put contact lenses in either eye on the day of your surgery.  Plan for someone to drive you to and from the procedure.  If you will be going home right after the procedure, plan to have someone with you for 24 hours. What happens during the procedure?  An IV tube may be inserted into one of your veins.  You will be given one or more of the following: ? A medicine to help you relax (sedative). ? A medicine to numb the area (local anesthetic). This may be numbing eye drops or an injection that is given behind the eye.  A small cut (incision) will be made to the edge of the clear, dome-shaped surface that covers the front of the eye (cornea).  A small probe will be inserted into the eye. This device gives off ultrasound waves that soften and break up the cloudy center of the lens. This makes it easier for the cloudy lens to be removed by suction.  An IOL may be implanted.  Part of the capsule that surrounds the lens will be left in the eye to support the IOL.  Your surgeon may use stitches (sutures) to close the incision. The procedure may vary among health care providers and hospitals. What happens after the procedure?  Your blood pressure, heart rate, breathing rate, and blood oxygen level will be monitored often until the medicines you were given have worn off.  You may be given a protective shield to wear over your eyes.  Do not drive for 24 hours if you received a sedative. This information is not intended to replace advice given to you by your health care provider. Make sure you discuss any questions you have with your health care provider. Document Released: 09/04/2011 Document Revised: 02/21/2016 Document Reviewed: 07/26/2015 Elsevier Interactive Patient  Education  [employer].

## 2018-03-03 ENCOUNTER — Other Ambulatory Visit: Payer: Self-pay

## 2018-03-03 ENCOUNTER — Encounter (HOSPITAL_COMMUNITY): Payer: Self-pay

## 2018-03-03 ENCOUNTER — Encounter (HOSPITAL_COMMUNITY)
Admission: RE | Admit: 2018-03-03 | Discharge: 2018-03-03 | Disposition: A | Discharge status: Home / Self Care | Payer: Medicare Other | Source: Ambulatory Visit | Attending: Ophthalmology | Admitting: Ophthalmology

## 2018-03-03 DIAGNOSIS — H2511 Age-related nuclear cataract, right eye: Secondary | ICD-10-CM | POA: Diagnosis not present

## 2018-03-03 DIAGNOSIS — Z01818 Encounter for other preprocedural examination: Secondary | ICD-10-CM | POA: Insufficient documentation

## 2018-03-03 HISTORY — DX: Other cystostomy status: Z93.59

## 2018-03-03 LAB — CBC WITH DIFFERENTIAL/PLATELET
BASOS ABS: 0 10*3/uL (ref 0.0–0.1)
BASOS PCT: 0 %
Eosinophils Absolute: 0.2 10*3/uL (ref 0.0–0.7)
Eosinophils Relative: 3 %
HEMATOCRIT: 43.6 % (ref 39.0–52.0)
HEMOGLOBIN: 14 g/dL (ref 13.0–17.0)
LYMPHS PCT: 23 %
Lymphs Abs: 1.8 10*3/uL (ref 0.7–4.0)
MCH: 29.9 pg (ref 26.0–34.0)
MCHC: 32.1 g/dL (ref 30.0–36.0)
MCV: 93.2 fL (ref 78.0–100.0)
MONO ABS: 0.5 10*3/uL (ref 0.1–1.0)
Monocytes Relative: 7 %
NEUTROS ABS: 5 10*3/uL (ref 1.7–7.7)
NEUTROS PCT: 67 %
Platelets: 162 10*3/uL (ref 150–400)
RBC: 4.68 MIL/uL (ref 4.22–5.81)
RDW: 13.6 % (ref 11.5–15.5)
WBC: 7.6 10*3/uL (ref 4.0–10.5)

## 2018-03-03 LAB — BASIC METABOLIC PANEL
ANION GAP: 8 (ref 5–15)
BUN: 29 mg/dL — ABNORMAL HIGH (ref 6–20)
CHLORIDE: 101 mmol/L (ref 101–111)
CO2: 28 mmol/L (ref 22–32)
Calcium: 9.3 mg/dL (ref 8.9–10.3)
Creatinine, Ser: 1.57 mg/dL — ABNORMAL HIGH (ref 0.61–1.24)
GFR calc non Af Amer: 42 mL/min — ABNORMAL LOW (ref >60)
GFR, EST AFRICAN AMERICAN: 48 mL/min — AB (ref >60)
GLUCOSE: 119 mg/dL — AB (ref 65–99)
POTASSIUM: 4.1 mmol/L (ref 3.5–5.1)
Sodium: 137 mmol/L (ref 135–145)

## 2018-03-08 ENCOUNTER — Ambulatory Visit (HOSPITAL_COMMUNITY): Payer: Medicare Other | Admitting: Anesthesiology

## 2018-03-08 ENCOUNTER — Ambulatory Visit (HOSPITAL_COMMUNITY)
Admission: RE | Admit: 2018-03-08 | Discharge: 2018-03-08 | Disposition: A | Discharge status: Home / Self Care | Payer: Medicare Other | Source: Ambulatory Visit | Attending: Ophthalmology | Admitting: Ophthalmology

## 2018-03-08 ENCOUNTER — Encounter (HOSPITAL_COMMUNITY)
Admission: RE | Disposition: A | Discharge status: Home / Self Care | Payer: Self-pay | Source: Ambulatory Visit | Attending: Ophthalmology

## 2018-03-08 ENCOUNTER — Encounter (HOSPITAL_COMMUNITY): Payer: Self-pay | Admitting: *Deleted

## 2018-03-08 ENCOUNTER — Other Ambulatory Visit: Payer: Self-pay

## 2018-03-08 DIAGNOSIS — N189 Chronic kidney disease, unspecified: Secondary | ICD-10-CM | POA: Diagnosis not present

## 2018-03-08 DIAGNOSIS — I110 Hypertensive heart disease with heart failure: Secondary | ICD-10-CM | POA: Insufficient documentation

## 2018-03-08 DIAGNOSIS — G35 Multiple sclerosis: Secondary | ICD-10-CM | POA: Insufficient documentation

## 2018-03-08 DIAGNOSIS — H25811 Combined forms of age-related cataract, right eye: Secondary | ICD-10-CM | POA: Insufficient documentation

## 2018-03-08 DIAGNOSIS — H2511 Age-related nuclear cataract, right eye: Secondary | ICD-10-CM | POA: Diagnosis not present

## 2018-03-08 DIAGNOSIS — E039 Hypothyroidism, unspecified: Secondary | ICD-10-CM | POA: Diagnosis not present

## 2018-03-08 DIAGNOSIS — I739 Peripheral vascular disease, unspecified: Secondary | ICD-10-CM | POA: Diagnosis not present

## 2018-03-08 DIAGNOSIS — D649 Anemia, unspecified: Secondary | ICD-10-CM | POA: Diagnosis not present

## 2018-03-08 DIAGNOSIS — I509 Heart failure, unspecified: Secondary | ICD-10-CM | POA: Diagnosis not present

## 2018-03-08 DIAGNOSIS — I2510 Atherosclerotic heart disease of native coronary artery without angina pectoris: Secondary | ICD-10-CM | POA: Diagnosis not present

## 2018-03-08 DIAGNOSIS — Z87891 Personal history of nicotine dependence: Secondary | ICD-10-CM | POA: Diagnosis not present

## 2018-03-08 DIAGNOSIS — Z8673 Personal history of transient ischemic attack (TIA), and cerebral infarction without residual deficits: Secondary | ICD-10-CM | POA: Diagnosis not present

## 2018-03-08 HISTORY — PX: CATARACT EXTRACTION W/PHACO: SHX586

## 2018-03-08 SURGERY — PHACOEMULSIFICATION, CATARACT, WITH IOL INSERTION
Anesthesia: Monitor Anesthesia Care | Site: Eye | Laterality: Right

## 2018-03-08 MED ORDER — LIDOCAINE HCL 3.5 % OP GEL
1.0000 "application " | Freq: Once | OPHTHALMIC | Status: AC
  Administered 2018-03-08: 1 via OPHTHALMIC

## 2018-03-08 MED ORDER — TETRACAINE HCL 0.5 % OP SOLN
1.0000 [drp] | OPHTHALMIC | Status: AC
  Administered 2018-03-08 (×3): 1 [drp] via OPHTHALMIC

## 2018-03-08 MED ORDER — LIDOCAINE HCL (PF) 1 % IJ SOLN
INTRAMUSCULAR | Status: DC | PRN
  Administered 2018-03-08: .5 mL

## 2018-03-08 MED ORDER — LIDOCAINE 3.5 % OP GEL OPTIME - NO CHARGE
OPHTHALMIC | Status: DC | PRN
  Administered 2018-03-08: 1 [drp] via OPHTHALMIC

## 2018-03-08 MED ORDER — PHENYLEPHRINE HCL 2.5 % OP SOLN
1.0000 [drp] | OPHTHALMIC | Status: AC
  Administered 2018-03-08 (×3): 1 [drp] via OPHTHALMIC

## 2018-03-08 MED ORDER — EPINEPHRINE PF 1 MG/ML IJ SOLN
INTRAOCULAR | Status: DC | PRN
  Administered 2018-03-08: 500 mL

## 2018-03-08 MED ORDER — BSS IO SOLN
INTRAOCULAR | Status: DC | PRN
  Administered 2018-03-08: 15 mL

## 2018-03-08 MED ORDER — POVIDONE-IODINE 5 % OP SOLN
OPHTHALMIC | Status: DC | PRN
  Administered 2018-03-08: 1 via OPHTHALMIC

## 2018-03-08 MED ORDER — PROVISC 10 MG/ML IO SOLN
INTRAOCULAR | Status: DC | PRN
  Administered 2018-03-08: 0.85 mL via INTRAOCULAR

## 2018-03-08 MED ORDER — NEOMYCIN-POLYMYXIN-DEXAMETH 3.5-10000-0.1 OP SUSP
OPHTHALMIC | Status: DC | PRN
  Administered 2018-03-08: 2 [drp] via OPHTHALMIC

## 2018-03-08 MED ORDER — CYCLOPENTOLATE-PHENYLEPHRINE 0.2-1 % OP SOLN
1.0000 [drp] | OPHTHALMIC | Status: AC
  Administered 2018-03-08 (×3): 1 [drp] via OPHTHALMIC

## 2018-03-08 MED ORDER — LACTATED RINGERS IV SOLN
INTRAVENOUS | Status: DC
  Administered 2018-03-08: 08:00:00 via INTRAVENOUS

## 2018-03-08 SURGICAL SUPPLY — 12 items
CLOTH BEACON ORANGE TIMEOUT ST (SAFETY) ×2 IMPLANT
EYE SHIELD UNIVERSAL CLEAR (GAUZE/BANDAGES/DRESSINGS) ×2 IMPLANT
GLOVE BIOGEL PI IND STRL 6.5 (GLOVE) IMPLANT
GLOVE BIOGEL PI IND STRL 7.0 (GLOVE) IMPLANT
GLOVE BIOGEL PI INDICATOR 6.5 (GLOVE) ×2
GLOVE BIOGEL PI INDICATOR 7.0 (GLOVE) ×2
LENS ALC ACRYL/TECN (Ophthalmic Related) ×2 IMPLANT
PAD ARMBOARD 7.5X6 YLW CONV (MISCELLANEOUS) ×2 IMPLANT
SYRINGE LUER LOK 1CC (MISCELLANEOUS) ×2 IMPLANT
TAPE SURG TRANSPORE 1 IN (GAUZE/BANDAGES/DRESSINGS) IMPLANT
TAPE SURGICAL TRANSPORE 1 IN (GAUZE/BANDAGES/DRESSINGS) ×2
WATER STERILE IRR 250ML POUR (IV SOLUTION) ×2 IMPLANT

## 2018-03-08 NOTE — Anesthesia Preprocedure Evaluation (Signed)
Anesthesia Evaluation  Patient identified by MRN, date of birth, ID band Patient awake    Reviewed: Allergy & Precautions, H&P , NPO status , Patient's Chart, lab work & pertinent test results, reviewed documented beta blocker date and time   Airway Mallampati: III  TM Distance: >3 FB Neck ROM: full    Dental no notable dental hx. (+) Chipped   Pulmonary neg pulmonary ROS, resolved, former smoker,    Pulmonary exam normal breath sounds clear to auscultation       Cardiovascular Exercise Tolerance: Good hypertension, On Medications + CAD, + Peripheral Vascular Disease and +CHF  negative cardio ROS   Rhythm:regular Rate:Normal     Neuro/Psych  Headaches, Anxiety TIA Neuromuscular disease negative neurological ROS  negative psych ROS   GI/Hepatic negative GI ROS, Neg liver ROS,   Endo/Other  negative endocrine ROSHypothyroidism   Renal/GU CRFnegative Renal ROS  negative genitourinary   Musculoskeletal   Abdominal   Peds  Hematology negative hematology ROS (+) anemia ,   Anesthesia Other Findings Onset of MS at age 76.  Ambulates with walker.  Condition has been stable. Denies anginal hx in setting of CAD Remote h/o TIAs  Reproductive/Obstetrics negative OB ROS                             Anesthesia Physical Anesthesia Plan  ASA: IV  Anesthesia Plan: MAC   Post-op Pain Management:    Induction:   PONV Risk Score and Plan:   Airway Management Planned:   Additional Equipment:   Intra-op Plan:   Post-operative Plan:   Informed Consent: I have reviewed the patients History and Physical, chart, labs and discussed the procedure including the risks, benefits and alternatives for the proposed anesthesia with the patient or authorized representative who has indicated his/her understanding and acceptance.   Dental Advisory Given  Plan Discussed with: CRNA and  Anesthesiologist  Anesthesia Plan Comments:         Anesthesia Quick Evaluation

## 2018-03-08 NOTE — Anesthesia Postprocedure Evaluation (Signed)
Anesthesia Post Note  Patient: Jordan Ward  Procedure(s) Performed: CATARACT EXTRACTION PHACO AND INTRAOCULAR LENS PLACEMENT RIGHT EYE (Right Eye)  Patient location during evaluation: Short Stay Anesthesia Type: MAC Level of consciousness: awake and alert Pain management: pain level controlled Vital Signs Assessment: post-procedure vital signs reviewed and stable Respiratory status: spontaneous breathing, nonlabored ventilation and respiratory function stable Cardiovascular status: blood pressure returned to baseline Postop Assessment: no apparent nausea or vomiting Anesthetic complications: no     Last Vitals:  Vitals:   03/08/18 0732 03/08/18 0835  BP: 133/75 (!) 147/77  Pulse: (!) 50 (!) 49  Resp: 17 17  Temp: 36.8 C   SpO2: 98% 98%    Last Pain:  Vitals:   03/08/18 0732  TempSrc: Oral  PainSc: 0-No pain                 Jestin Burbach J

## 2018-03-08 NOTE — H&P (Signed)
I have reviewed the H&P, the patient was re-examined, and I have identified no interval changes in medical condition and plan of care since the history and physical of record  

## 2018-03-08 NOTE — Transfer of Care (Signed)
Immediate Anesthesia Transfer of Care Note  Patient: Jordan Ward  Procedure(s) Performed: CATARACT EXTRACTION PHACO AND INTRAOCULAR LENS PLACEMENT RIGHT EYE (Right Eye)  Patient Location: Short Stay  Anesthesia Type:MAC  Level of Consciousness: awake and patient cooperative  Airway & Oxygen Therapy: Patient Spontanous Breathing  Post-op Assessment: Report given to RN and Post -op Vital signs reviewed and stable  Post vital signs: Reviewed and stable  Last Vitals:  Vitals Value Taken Time  BP    Temp    Pulse    Resp    SpO2      Last Pain:  Vitals:   03/08/18 0732  TempSrc: Oral  PainSc: 0-No pain         Complications: No apparent anesthesia complications

## 2018-03-08 NOTE — Op Note (Signed)
Date of Admission: 03/08/2018  Date of Surgery: 03/08/2018  Pre-Op Dx: Cataract Right  Eye  Post-Op Dx: Senile Combined Cataract  Right  Eye,  Dx Code V01.314  Surgeon: Tonny Branch, M.D.  Assistants: None  Anesthesia: Topical with MAC  Indications: Painless, progressive loss of vision with compromise of daily activities.  Surgery: Cataract Extraction with Intraocular lens Implant Right Eye  Discription: The patient had dilating drops and viscous lidocaine placed into the Right eye in the pre-op holding area. After transfer to the operating room, a time out was performed. The patient was then prepped and draped. Beginning with a 56m blade a paracentesis port was made at the surgeon's 2 o'clock position. The anterior chamber was then filled with 1% non-preserved lidocaine. This was followed by filling the anterior chamber with Provisc.  A 2.496mkeratome blade was used to make a clear corneal incision at the temporal limbus.  A bent cystatome needle was used to create a continuous tear capsulotomy. Hydrodissection was performed with balanced salt solution on a Fine canula. The lens nucleus was then removed using the phacoemulsification handpiece. Residual cortex was removed with the I&A handpiece. The anterior chamber and capsular bag were refilled with Provisc. A posterior chamber intraocular lens was placed into the capsular bag with it's injector. The implant was positioned with the Kuglan hook. The Provisc was then removed from the anterior chamber and capsular bag with the I&A handpiece. Stromal hydration of the main incision and paracentesis port was performed with BSS on a Fine canula. The wounds were tested for leak which was negative. The patient tolerated the procedure well. There were no operative complications. The patient was then transferred to the recovery room in stable condition.  Complications: None  Specimen: None  EBL: None  Prosthetic device: J&J Technis, PCB00, power 20.0,  SN [id number]

## 2018-03-09 ENCOUNTER — Encounter (HOSPITAL_COMMUNITY): Payer: Self-pay | Admitting: Ophthalmology

## 2018-03-15 DIAGNOSIS — H25812 Combined forms of age-related cataract, left eye: Secondary | ICD-10-CM | POA: Diagnosis not present

## 2018-03-17 ENCOUNTER — Inpatient Hospital Stay (HOSPITAL_COMMUNITY): Admission: RE | Admit: 2018-03-17 | Payer: Medicare Other | Source: Ambulatory Visit

## 2018-03-19 ENCOUNTER — Ambulatory Visit (INDEPENDENT_AMBULATORY_CARE_PROVIDER_SITE_OTHER): Payer: Medicare Other | Admitting: Urology

## 2018-03-19 DIAGNOSIS — N131 Hydronephrosis with ureteral stricture, not elsewhere classified: Secondary | ICD-10-CM | POA: Diagnosis not present

## 2018-03-19 DIAGNOSIS — N312 Flaccid neuropathic bladder, not elsewhere classified: Secondary | ICD-10-CM | POA: Diagnosis not present

## 2018-03-24 ENCOUNTER — Encounter (INDEPENDENT_AMBULATORY_CARE_PROVIDER_SITE_OTHER): Payer: Medicare Other | Admitting: Ophthalmology

## 2018-03-24 DIAGNOSIS — H35033 Hypertensive retinopathy, bilateral: Secondary | ICD-10-CM | POA: Diagnosis not present

## 2018-03-24 DIAGNOSIS — I10 Essential (primary) hypertension: Secondary | ICD-10-CM

## 2018-03-24 DIAGNOSIS — H34832 Tributary (branch) retinal vein occlusion, left eye, with macular edema: Secondary | ICD-10-CM | POA: Diagnosis not present

## 2018-03-24 DIAGNOSIS — H2512 Age-related nuclear cataract, left eye: Secondary | ICD-10-CM | POA: Diagnosis not present

## 2018-03-24 DIAGNOSIS — H43813 Vitreous degeneration, bilateral: Secondary | ICD-10-CM | POA: Diagnosis not present

## 2018-03-30 ENCOUNTER — Encounter (HOSPITAL_COMMUNITY): Payer: Self-pay

## 2018-03-30 ENCOUNTER — Encounter (HOSPITAL_COMMUNITY)
Admission: RE | Admit: 2018-03-30 | Discharge: 2018-03-30 | Disposition: A | Discharge status: Home / Self Care | Payer: Medicare Other | Source: Ambulatory Visit | Attending: Ophthalmology | Admitting: Ophthalmology

## 2018-03-31 MED ORDER — PROMETHAZINE HCL 25 MG/ML IJ SOLN: 6.2500 mg | INTRAMUSCULAR | Status: DC | PRN

## 2018-03-31 MED ORDER — HYDROMORPHONE HCL 1 MG/ML IJ SOLN: 0.2500 mg | INTRAMUSCULAR | Status: DC | PRN

## 2018-03-31 MED ORDER — MEPERIDINE HCL 50 MG/ML IJ SOLN: 6.2500 mg | INTRAMUSCULAR | Status: DC | PRN

## 2018-03-31 MED ORDER — HYDROCODONE-ACETAMINOPHEN 7.5-325 MG PO TABS: 1.0000 | ORAL_TABLET | Freq: Once | ORAL | Status: DC | PRN

## 2018-03-31 MED ORDER — LACTATED RINGERS IV SOLN
INTRAVENOUS | Status: DC
  Administered 2018-04-05: 1000 mL via INTRAVENOUS

## 2018-04-05 ENCOUNTER — Encounter (HOSPITAL_COMMUNITY): Payer: Self-pay | Admitting: Anesthesiology

## 2018-04-05 ENCOUNTER — Ambulatory Visit (HOSPITAL_COMMUNITY): Payer: Medicare Other | Admitting: Anesthesiology

## 2018-04-05 ENCOUNTER — Ambulatory Visit (HOSPITAL_COMMUNITY)
Admission: RE | Admit: 2018-04-05 | Discharge: 2018-04-05 | Disposition: A | Discharge status: Home / Self Care | Payer: Medicare Other | Source: Ambulatory Visit | Attending: Ophthalmology | Admitting: Ophthalmology

## 2018-04-05 ENCOUNTER — Encounter (HOSPITAL_COMMUNITY)
Admission: RE | Disposition: A | Discharge status: Home / Self Care | Payer: Self-pay | Source: Ambulatory Visit | Attending: Ophthalmology

## 2018-04-05 DIAGNOSIS — H25812 Combined forms of age-related cataract, left eye: Secondary | ICD-10-CM | POA: Insufficient documentation

## 2018-04-05 DIAGNOSIS — G4730 Sleep apnea, unspecified: Secondary | ICD-10-CM | POA: Insufficient documentation

## 2018-04-05 DIAGNOSIS — Z8673 Personal history of transient ischemic attack (TIA), and cerebral infarction without residual deficits: Secondary | ICD-10-CM | POA: Diagnosis not present

## 2018-04-05 DIAGNOSIS — Z87891 Personal history of nicotine dependence: Secondary | ICD-10-CM | POA: Insufficient documentation

## 2018-04-05 DIAGNOSIS — Z9989 Dependence on other enabling machines and devices: Secondary | ICD-10-CM | POA: Diagnosis not present

## 2018-04-05 DIAGNOSIS — I110 Hypertensive heart disease with heart failure: Secondary | ICD-10-CM | POA: Diagnosis not present

## 2018-04-05 DIAGNOSIS — I10 Essential (primary) hypertension: Secondary | ICD-10-CM | POA: Diagnosis not present

## 2018-04-05 DIAGNOSIS — H2512 Age-related nuclear cataract, left eye: Secondary | ICD-10-CM | POA: Diagnosis not present

## 2018-04-05 DIAGNOSIS — I509 Heart failure, unspecified: Secondary | ICD-10-CM | POA: Diagnosis not present

## 2018-04-05 DIAGNOSIS — I2510 Atherosclerotic heart disease of native coronary artery without angina pectoris: Secondary | ICD-10-CM | POA: Diagnosis not present

## 2018-04-05 DIAGNOSIS — I739 Peripheral vascular disease, unspecified: Secondary | ICD-10-CM | POA: Diagnosis not present

## 2018-04-05 HISTORY — PX: CATARACT EXTRACTION W/PHACO: SHX586

## 2018-04-05 SURGERY — PHACOEMULSIFICATION, CATARACT, WITH IOL INSERTION
Anesthesia: Monitor Anesthesia Care | Site: Eye | Laterality: Left

## 2018-04-05 MED ORDER — MIDAZOLAM HCL 2 MG/2ML IJ SOLN
INTRAMUSCULAR | Status: AC
  Filled 2018-04-05: qty 2

## 2018-04-05 MED ORDER — LIDOCAINE HCL (PF) 1 % IJ SOLN
INTRAMUSCULAR | Status: DC | PRN
  Administered 2018-04-05: .5 mL

## 2018-04-05 MED ORDER — LIDOCAINE 3.5 % OP GEL OPTIME - NO CHARGE
OPHTHALMIC | Status: DC | PRN
  Administered 2018-04-05: 1 [drp] via OPHTHALMIC

## 2018-04-05 MED ORDER — POVIDONE-IODINE 5 % OP SOLN
OPHTHALMIC | Status: DC | PRN
  Administered 2018-04-05: 1 via OPHTHALMIC

## 2018-04-05 MED ORDER — TETRACAINE HCL 0.5 % OP SOLN
1.0000 [drp] | OPHTHALMIC | Status: AC
  Administered 2018-04-05 (×3): 1 [drp] via OPHTHALMIC

## 2018-04-05 MED ORDER — PROVISC 10 MG/ML IO SOLN
INTRAOCULAR | Status: DC | PRN
  Administered 2018-04-05: 0.85 mL via INTRAOCULAR

## 2018-04-05 MED ORDER — LACTATED RINGERS IV SOLN: INTRAVENOUS | Status: DC

## 2018-04-05 MED ORDER — BSS IO SOLN
INTRAOCULAR | Status: DC | PRN
  Administered 2018-04-05: 15 mL

## 2018-04-05 MED ORDER — LIDOCAINE HCL 3.5 % OP GEL
1.0000 | Freq: Once | OPHTHALMIC | Status: AC
Start: 2018-04-05 — End: 2018-04-05
  Administered 2018-04-05: 1 via OPHTHALMIC

## 2018-04-05 MED ORDER — NEOMYCIN-POLYMYXIN-DEXAMETH 3.5-10000-0.1 OP SUSP
OPHTHALMIC | Status: DC | PRN
  Administered 2018-04-05: 2 [drp] via OPHTHALMIC

## 2018-04-05 MED ORDER — CYCLOPENTOLATE-PHENYLEPHRINE 0.2-1 % OP SOLN
1.0000 [drp] | OPHTHALMIC | Status: AC
  Administered 2018-04-05 (×3): 1 [drp] via OPHTHALMIC

## 2018-04-05 MED ORDER — PHENYLEPHRINE HCL 2.5 % OP SOLN
1.0000 [drp] | OPHTHALMIC | Status: AC
  Administered 2018-04-05 (×3): 1 [drp] via OPHTHALMIC

## 2018-04-05 MED ORDER — EPINEPHRINE PF 1 MG/ML IJ SOLN
INTRAOCULAR | Status: DC | PRN
  Administered 2018-04-05: 500 mL

## 2018-04-05 SURGICAL SUPPLY — 10 items
CLOTH BEACON ORANGE TIMEOUT ST (SAFETY) ×2 IMPLANT
EYE SHIELD UNIVERSAL CLEAR (GAUZE/BANDAGES/DRESSINGS) ×2 IMPLANT
GLOVE BIOGEL PI IND STRL 7.0 (GLOVE) IMPLANT
GLOVE BIOGEL PI INDICATOR 7.0 (GLOVE) ×4
LENS ALC ACRYL/TECN (Ophthalmic Related) ×2 IMPLANT
PAD ARMBOARD 7.5X6 YLW CONV (MISCELLANEOUS) ×2 IMPLANT
SYRINGE LUER LOK 1CC (MISCELLANEOUS) ×2 IMPLANT
TAPE SURG TRANSPORE 1 IN (GAUZE/BANDAGES/DRESSINGS) IMPLANT
TAPE SURGICAL TRANSPORE 1 IN (GAUZE/BANDAGES/DRESSINGS) ×2
WATER STERILE IRR 250ML POUR (IV SOLUTION) ×2 IMPLANT

## 2018-04-05 NOTE — Discharge Instructions (Signed)

## 2018-04-05 NOTE — Anesthesia Preprocedure Evaluation (Signed)
Anesthesia Evaluation  Patient identified by MRN, date of birth, ID band Patient awake    Reviewed: Allergy & Precautions, H&P , NPO status , Patient's Chart, lab work & pertinent test results, reviewed documented beta blocker date and time   Airway Mallampati: II  TM Distance: >3 FB Neck ROM: full    Dental no notable dental hx.    Pulmonary neg pulmonary ROS, sleep apnea and Continuous Positive Airway Pressure Ventilation , resolved, former smoker,    Pulmonary exam normal breath sounds clear to auscultation       Cardiovascular Exercise Tolerance: Good hypertension, On Medications + CAD, + Peripheral Vascular Disease and +CHF  negative cardio ROS   Rhythm:regular Rate:Normal     Neuro/Psych  Headaches, PSYCHIATRIC DISORDERS Depression TIA Neuromuscular disease CVA negative neurological ROS  negative psych ROS   GI/Hepatic negative GI ROS, Neg liver ROS,   Endo/Other  negative endocrine ROSHypothyroidism   Renal/GU negative Renal ROS  negative genitourinary   Musculoskeletal   Abdominal   Peds  Hematology negative hematology ROS (+) anemia ,   Anesthesia Other Findings Presents in SB 44-50 Reports advanced MS, primarily weak below the waist.  Difficulty ambulating.  Occasional difficulty with speech  Reproductive/Obstetrics negative OB ROS                             Anesthesia Physical Anesthesia Plan  ASA: IV  Anesthesia Plan: MAC   Post-op Pain Management:    Induction:   PONV Risk Score and Plan:   Airway Management Planned:   Additional Equipment:   Intra-op Plan:   Post-operative Plan:   Informed Consent: I have reviewed the patients History and Physical, chart, labs and discussed the procedure including the risks, benefits and alternatives for the proposed anesthesia with the patient or authorized representative who has indicated his/her understanding and  acceptance.   Dental Advisory Given  Plan Discussed with: CRNA and Anesthesiologist  Anesthesia Plan Comments:         Anesthesia Quick Evaluation

## 2018-04-05 NOTE — H&P (Signed)
I have reviewed the H&P, the patient was re-examined, and I have identified no interval changes in medical condition and plan of care since the history and physical of record  

## 2018-04-05 NOTE — Transfer of Care (Signed)
Immediate Anesthesia Transfer of Care Note  Patient: Jordan Ward  Procedure(s) Performed: CATARACT EXTRACTION PHACO AND INTRAOCULAR LENS PLACEMENT ([place]) (Left Eye)  Patient Location: Short Stay  Anesthesia Type:MAC  Level of Consciousness: awake  Airway & Oxygen Therapy: Patient Spontanous Breathing  Post-op Assessment: Report given to RN  Post vital signs: Reviewed  Last Vitals:  Vitals Value Taken Time  BP    Temp    Pulse    Resp    SpO2      Last Pain:  Vitals:   04/05/18 0715  PainSc: 0-No pain         Complications: No apparent anesthesia complications

## 2018-04-05 NOTE — Anesthesia Postprocedure Evaluation (Signed)
Anesthesia Post Note  Patient: SOHIL TIMKO  Procedure(s) Performed: CATARACT EXTRACTION PHACO AND INTRAOCULAR LENS PLACEMENT ([place]) (Left Eye)  Patient location during evaluation: Short Stay Anesthesia Type: MAC Level of consciousness: awake and alert and oriented Pain management: pain level controlled Vital Signs Assessment: post-procedure vital signs reviewed and stable Respiratory status: spontaneous breathing Cardiovascular status: blood pressure returned to baseline and stable Postop Assessment: no apparent nausea or vomiting Anesthetic complications: no     Last Vitals:  Vitals:   04/05/18 0715 04/05/18 0729  BP: (!) 176/77 (!) 183/73  Pulse: (!) 48 (!) 44  Resp: 18 16  Temp: 36.9 C   SpO2: 97% 95%    Last Pain:  Vitals:   04/05/18 0715  PainSc: 0-No pain                 Stuti Sandin

## 2018-04-05 NOTE — Op Note (Signed)
Date of Admission: 04/05/2018  Date of Surgery: 04/05/2018  Pre-Op Dx: Cataract Left  Eye  Post-Op Dx: Senile Combined Cataract  Left  Eye,  Dx Code T62.263  Surgeon: Tonny Branch, M.D.  Assistants: None  Anesthesia: Topical with MAC  Indications: Painless, progressive loss of vision with compromise of daily activities.  Surgery: Cataract Extraction with Intraocular lens Implant Left Eye  Discription: The patient had dilating drops and viscous lidocaine placed into the Left eye in the pre-op holding area. After transfer to the operating room, a time out was performed. The patient was then prepped and draped. Beginning with a 47m blade a paracentesis port was made at the surgeon's 2 o'clock position. The anterior chamber was then filled with 1% non-preserved lidocaine. This was followed by filling the anterior chamber with Provisc.  A 2.435mkeratome blade was used to make a clear corneal incision at the temporal limbus.  A bent cystatome needle was used to create a continuous tear capsulotomy. Hydrodissection was performed with balanced salt solution on a Fine canula. The lens nucleus was then removed using the phacoemulsification handpiece. Residual cortex was removed with the I&A handpiece. The anterior chamber and capsular bag were refilled with Provisc. A posterior chamber intraocular lens was placed into the capsular bag with it's injector. The implant was positioned with the Kuglan hook. The Provisc was then removed from the anterior chamber and capsular bag with the I&A handpiece. Stromal hydration of the main incision and paracentesis port was performed with BSS on a Fine canula. The wounds were tested for leak which was negative. The patient tolerated the procedure well. There were no operative complications. The patient was then transferred to the recovery room in stable condition.  Complications: None  Specimen: None  EBL: None  Prosthetic device: J&J Technis, PCB00, power 19.5, SN  [id number]

## 2018-04-06 ENCOUNTER — Encounter (HOSPITAL_COMMUNITY): Payer: Self-pay | Admitting: Ophthalmology

## 2018-04-09 ENCOUNTER — Ambulatory Visit (INDEPENDENT_AMBULATORY_CARE_PROVIDER_SITE_OTHER): Payer: Medicare Other | Admitting: Urology

## 2018-04-09 DIAGNOSIS — N131 Hydronephrosis with ureteral stricture, not elsewhere classified: Secondary | ICD-10-CM | POA: Diagnosis not present

## 2018-04-21 ENCOUNTER — Encounter (INDEPENDENT_AMBULATORY_CARE_PROVIDER_SITE_OTHER): Payer: Medicare Other | Admitting: Ophthalmology

## 2018-04-21 DIAGNOSIS — H34832 Tributary (branch) retinal vein occlusion, left eye, with macular edema: Secondary | ICD-10-CM | POA: Diagnosis not present

## 2018-04-21 DIAGNOSIS — H43813 Vitreous degeneration, bilateral: Secondary | ICD-10-CM

## 2018-04-21 DIAGNOSIS — H35033 Hypertensive retinopathy, bilateral: Secondary | ICD-10-CM | POA: Diagnosis not present

## 2018-04-21 DIAGNOSIS — I10 Essential (primary) hypertension: Secondary | ICD-10-CM

## 2018-04-28 ENCOUNTER — Ambulatory Visit (INDEPENDENT_AMBULATORY_CARE_PROVIDER_SITE_OTHER): Payer: Medicare Other | Admitting: Urology

## 2018-04-28 DIAGNOSIS — N201 Calculus of ureter: Secondary | ICD-10-CM

## 2018-05-21 ENCOUNTER — Ambulatory Visit (INDEPENDENT_AMBULATORY_CARE_PROVIDER_SITE_OTHER): Payer: Medicare Other | Admitting: Urology

## 2018-05-21 DIAGNOSIS — N312 Flaccid neuropathic bladder, not elsewhere classified: Secondary | ICD-10-CM

## 2018-05-24 ENCOUNTER — Encounter (INDEPENDENT_AMBULATORY_CARE_PROVIDER_SITE_OTHER): Payer: Medicare Other | Admitting: Ophthalmology

## 2018-05-24 DIAGNOSIS — H34832 Tributary (branch) retinal vein occlusion, left eye, with macular edema: Secondary | ICD-10-CM

## 2018-05-24 DIAGNOSIS — I10 Essential (primary) hypertension: Secondary | ICD-10-CM | POA: Diagnosis not present

## 2018-05-24 DIAGNOSIS — H35033 Hypertensive retinopathy, bilateral: Secondary | ICD-10-CM

## 2018-05-24 DIAGNOSIS — H43813 Vitreous degeneration, bilateral: Secondary | ICD-10-CM

## 2018-06-02 ENCOUNTER — Telehealth: Payer: Self-pay | Admitting: Neurology

## 2018-06-02 ENCOUNTER — Other Ambulatory Visit: Payer: Self-pay | Admitting: Neurology

## 2018-06-02 NOTE — Telephone Encounter (Signed)
Pt requesting refills for diazepam (VALIUM) 5 MG tablet sent to [facility]

## 2018-06-02 NOTE — Telephone Encounter (Addendum)
Patient confirmed his last refill was around 05/17/2018. Patient advised we will refill as 06/17/2018 gets closer.  Patient was agreeable and had no further questions at this time.

## 2018-06-02 NOTE — Telephone Encounter (Signed)
Patient requesting refill for Valium 5 mg 1 tab twice daily. Drug registry checked. Last refill was 05/17/2018 # 60 and the refill before that was 04/13/2018 # 60. Both refills were given by our office. MB RN.

## 2018-06-02 NOTE — Telephone Encounter (Signed)
If he last refilled on 8/19 then he is a little early to request a refill. I will be happy to refill it when it gets closer.

## 2018-06-09 NOTE — Telephone Encounter (Signed)
Patient is due for his vailum refill on 06/17/2018. Will hold refill request until closer to this date. MB RN.

## 2018-06-11 ENCOUNTER — Ambulatory Visit (INDEPENDENT_AMBULATORY_CARE_PROVIDER_SITE_OTHER): Payer: Medicare Other | Admitting: Urology

## 2018-06-11 DIAGNOSIS — N312 Flaccid neuropathic bladder, not elsewhere classified: Secondary | ICD-10-CM | POA: Diagnosis not present

## 2018-06-16 MED ORDER — DIAZEPAM 5 MG PO TABS: 5.0000 mg | ORAL_TABLET | Freq: Two times a day (BID) | ORAL | 3 refills | Status: DC

## 2018-06-16 NOTE — Addendum Note (Signed)
Addended by: Trudie Buckler on: 06/16/2018 04:21 PM   Modules accepted: Orders

## 2018-06-16 NOTE — Telephone Encounter (Signed)
Pt requesting refills for diazepam (VALIUM) 5 MG tablet sent to [facility]

## 2018-06-29 ENCOUNTER — Encounter (INDEPENDENT_AMBULATORY_CARE_PROVIDER_SITE_OTHER): Payer: Medicare Other | Admitting: Ophthalmology

## 2018-07-01 ENCOUNTER — Encounter (INDEPENDENT_AMBULATORY_CARE_PROVIDER_SITE_OTHER): Payer: Medicare Other | Admitting: Ophthalmology

## 2018-07-01 DIAGNOSIS — I10 Essential (primary) hypertension: Secondary | ICD-10-CM

## 2018-07-01 DIAGNOSIS — H43813 Vitreous degeneration, bilateral: Secondary | ICD-10-CM

## 2018-07-01 DIAGNOSIS — H35033 Hypertensive retinopathy, bilateral: Secondary | ICD-10-CM

## 2018-07-01 DIAGNOSIS — H34832 Tributary (branch) retinal vein occlusion, left eye, with macular edema: Secondary | ICD-10-CM

## 2018-07-02 ENCOUNTER — Ambulatory Visit (INDEPENDENT_AMBULATORY_CARE_PROVIDER_SITE_OTHER): Payer: Medicare Other | Admitting: Urology

## 2018-07-02 DIAGNOSIS — T83510D Infection and inflammatory reaction due to cystostomy catheter, subsequent encounter: Secondary | ICD-10-CM

## 2018-07-02 DIAGNOSIS — Z87442 Personal history of urinary calculi: Secondary | ICD-10-CM | POA: Diagnosis not present

## 2018-07-02 DIAGNOSIS — N210 Calculus in bladder: Secondary | ICD-10-CM | POA: Diagnosis not present

## 2018-07-07 ENCOUNTER — Other Ambulatory Visit: Payer: Self-pay | Admitting: Urology

## 2018-07-07 DIAGNOSIS — T83510D Infection and inflammatory reaction due to cystostomy catheter, subsequent encounter: Secondary | ICD-10-CM

## 2018-07-16 NOTE — Patient Instructions (Signed)
Jordan Ward  07/16/2018     @PREFPERIOPPHARMACY @   Your procedure is scheduled on  07/27/2018.  Report to [facility] at  [place]   A.M.  Call this number if you have problems the morning of surgery:  [phone]   Remember:  Do not eat or drink after midnight.                        Take these medicines the morning of surgery with A SIP OF WATER  Carvedilol, valium( if needed), hydrocodone ( if needed), levothyroxine, mirabegron, verapamil.    Do not wear jewelry, make-up or nail polish.  Do not wear lotions, powders, or perfumes, or deodorant.  Do not shave 48 hours prior to surgery.  Men may shave face and neck.  Do not bring valuables to the hospital.  [facility] is not responsible for any belongings or valuables.  Contacts, dentures or bridgework may not be worn into surgery.  Leave your suitcase in the car.  After surgery it may be brought to your room.  For patients admitted to the hospital, discharge time will be determined by your treatment team.  Patients discharged the day of surgery will not be allowed to drive home.   Name and phone number of your driver:   family Special instructions:  None  Please read over the following fact sheets that you were given. Anesthesia Post-op Instructions and Care and Recovery After Surgery       Cystoscopy Cystoscopy is a procedure that is used to help diagnose and sometimes treat conditions that affect that lower urinary tract. The lower urinary tract includes the bladder and the tube that drains urine from the bladder out of the body (urethra). Cystoscopy is performed with a thin, tube-shaped instrument with a light and camera at the end (cystoscope). The cystoscope may be hard (rigid) or flexible, depending on the goal of the procedure.The cystoscope is inserted through the urethra, into the bladder. Cystoscopy may be recommended if you have:  Urinary tractinfections that keep coming back  (recurring).  Blood in the urine (hematuria).  Loss of bladder control (urinary incontinence) or an overactive bladder.  Unusual cells found in a urine sample.  A blockage in the urethra.  Painful urination.  An abnormality in the bladder found during an intravenous pyelogram (IVP) or CT scan.  Cystoscopy may also be done to remove a sample of tissue to be examined under a microscope (biopsy). Tell a health care provider about:  Any allergies you have.  All medicines you are taking, including vitamins, herbs, eye drops, creams, and over-the-counter medicines.  Any problems you or family members have had with anesthetic medicines.  Any blood disorders you have.  Any surgeries you have had.  Any medical conditions you have.  Whether you are pregnant or may be pregnant. What are the risks? Generally, this is a safe procedure. However, problems may occur, including:  Infection.  Bleeding.  Allergic reactions to medicines.  Damage to other structures or organs.  What happens before the procedure?  Ask your health care provider about: ? Changing or stopping your regular medicines. This is especially important if you are taking diabetes medicines or blood thinners. ? Taking medicines such as aspirin and ibuprofen. These medicines can thin your blood. Do not take these medicines before your procedure if your health care provider instructs you not to.  Follow instructions from  your health care provider about eating or drinking restrictions.  You may be given antibiotic medicine to help prevent infection.  You may have an exam or testing, such as X-rays of the bladder, urethra, or kidneys.  You may have urine tests to check for signs of infection.  Plan to have someone take you home after the procedure. What happens during the procedure?  To reduce your risk of infection,your health care team will wash or sanitize their hands.  You will be given one or more of the  following: ? A medicine to help you relax (sedative). ? A medicine to numb the area (local anesthetic).  The area around the opening of your urethra will be cleaned.  The cystoscope will be passed through your urethra into your bladder.  Germ-free (sterile)fluid will flow through the cystoscope to fill your bladder. The fluid will stretch your bladder so that your surgeon can clearly examine your bladder walls.  The cystoscope will be removed and your bladder will be emptied. The procedure may vary among health care providers and hospitals. What happens after the procedure?  You may have some soreness or pain in your abdomen and urethra. Medicines will be available to help you.  You may have some blood in your urine.  Do not drive for 24 hours if you received a sedative. This information is not intended to replace advice given to you by your health care provider. Make sure you discuss any questions you have with your health care provider. Document Released: 09/12/2000 Document Revised: 01/24/2016 Document Reviewed: 08/02/2015 Elsevier Interactive Patient Education  2018 [employer].  Cystoscopy, Care After Refer to this sheet in the next few weeks. These instructions provide you with information about caring for yourself after your procedure. Your health care provider may also give you more specific instructions. Your treatment has been planned according to current medical practices, but problems sometimes occur. Call your health care provider if you have any problems or questions after your procedure. What can I expect after the procedure? After the procedure, it is common to have:  Mild pain when you urinate. Pain should stop within a few minutes after you urinate. This may last for up to 1 week.  A small amount of blood in your urine for several days.  Feeling like you need to urinate but producing only a small amount of urine.  Follow these instructions at  home:  Medicines  Take over-the-counter and prescription medicines only as told by your health care provider.  If you were prescribed an antibiotic medicine, take it as told by your health care provider. Do not stop taking the antibiotic even if you start to feel better. General instructions   Return to your normal activities as told by your health care provider. Ask your health care provider what activities are safe for you.  Do not drive for 24 hours if you received a sedative.  Watch for any blood in your urine. If the amount of blood in your urine increases, call your health care provider.  Follow instructions from your health care provider about eating or drinking restrictions.  If a tissue sample was removed for testing (biopsy) during your procedure, it is your responsibility to get your test results. Ask your health care provider or the department performing the test when your results will be ready.  Drink enough fluid to keep your urine clear or pale yellow.  Keep all follow-up visits as told by your health care provider. This is  important. Contact a health care provider if:  You have pain that gets worse or does not get better with medicine, especially pain when you urinate.  You have difficulty urinating. Get help right away if:  You have more blood in your urine.  You have blood clots in your urine.  You have abdominal pain.  You have a fever or chills.  You are unable to urinate. This information is not intended to replace advice given to you by your health care provider. Make sure you discuss any questions you have with your health care provider. Document Released: 04/04/2005 Document Revised: 02/21/2016 Document Reviewed: 08/02/2015 Elsevier Interactive Patient Education  2018 [place] Anesthesia, Adult General anesthesia is the use of medicines to make a person "go to sleep" (be unconscious) for a medical procedure. General anesthesia is  often recommended when a procedure:  Is long.  Requires you to be still or in an unusual position.  Is major and can cause you to lose blood.  Is impossible to do without general anesthesia.  The medicines used for general anesthesia are called general anesthetics. In addition to making you sleep, the medicines:  Prevent pain.  Control your blood pressure.  Relax your muscles.  Tell a health care provider about:  Any allergies you have.  All medicines you are taking, including vitamins, herbs, eye drops, creams, and over-the-counter medicines.  Any problems you or family members have had with anesthetic medicines.  Types of anesthetics you have had in the past.  Any bleeding disorders you have.  Any surgeries you have had.  Any medical conditions you have.  Any history of heart or lung conditions, such as heart failure, sleep apnea, or chronic obstructive pulmonary disease (COPD).  Whether you are pregnant or may be pregnant.  Whether you use tobacco, alcohol, marijuana, or street drugs.  Any history of Armed forces logistics/support/administrative officer.  Any history of depression or anxiety. What are the risks? Generally, this is a safe procedure. However, problems may occur, including:  Allergic reaction to anesthetics.  Lung and heart problems.  Inhaling food or liquids from your stomach into your lungs (aspiration).  Injury to nerves.  Waking up during your procedure and being unable to move (rare).  Extreme agitation or a state of mental confusion (delirium) when you wake up from the anesthetic.  Air in the bloodstream, which can lead to stroke.  These problems are more likely to develop if you are having a major surgery or if you have an advanced medical condition. You can prevent some of these complications by answering all of your health care provider's questions thoroughly and by following all pre-procedure instructions. General anesthesia can cause side effects,  including:  Nausea or vomiting  A sore throat from the breathing tube.  Feeling cold or shivery.  Feeling tired, washed out, or achy.  Sleepiness or drowsiness.  Confusion or agitation.  What happens before the procedure? Staying hydrated Follow instructions from your health care provider about hydration, which may include:  Up to 2 hours before the procedure - you may continue to drink clear liquids, such as water, clear fruit juice, black coffee, and plain tea.  Eating and drinking restrictions Follow instructions from your health care provider about eating and drinking, which may include:  8 hours before the procedure - stop eating heavy meals or foods such as meat, fried foods, or fatty foods.  6 hours before the procedure - stop eating light meals or foods, such as toast  or cereal.  6 hours before the procedure - stop drinking milk or drinks that contain milk.  2 hours before the procedure - stop drinking clear liquids.  Medicines  Ask your health care provider about: ? Changing or stopping your regular medicines. This is especially important if you are taking diabetes medicines or blood thinners. ? Taking medicines such as aspirin and ibuprofen. These medicines can thin your blood. Do not take these medicines before your procedure if your health care provider instructs you not to. ? Taking new dietary supplements or medicines. Do not take these during the week before your procedure unless your health care provider approves them.  If you are told to take a medicine or to continue taking a medicine on the day of the procedure, take the medicine with sips of water. General instructions   Ask if you will be going home the same day, the following day, or after a longer hospital stay. ? Plan to have someone take you home. ? Plan to have someone stay with you for the first 24 hours after you leave the hospital or clinic.  For 3-6 weeks before the procedure, try not to use  any tobacco products, such as cigarettes, chewing tobacco, and e-cigarettes.  You may brush your teeth on the morning of the procedure, but make sure to spit out the toothpaste. What happens during the procedure?  You will be given anesthetics through a mask and through an IV tube in one of your veins.  You may receive medicine to help you relax (sedative).  As soon as you are asleep, a breathing tube may be used to help you breathe.  An anesthesia specialist will stay with you throughout the procedure. He or she will help keep you comfortable and safe by continuing to give you medicines and adjusting the amount of medicine that you get. He or she will also watch your blood pressure, pulse, and oxygen levels to make sure that the anesthetics do not cause any problems.  If a breathing tube was used to help you breathe, it will be removed before you wake up. The procedure may vary among health care providers and hospitals. What happens after the procedure?  You will wake up, often slowly, after the procedure is complete, usually in a recovery area.  Your blood pressure, heart rate, breathing rate, and blood oxygen level will be monitored until the medicines you were given have worn off.  You may be given medicine to help you calm down if you feel anxious or agitated.  If you will be going home the same day, your health care provider may check to make sure you can stand, drink, and urinate.  Your health care providers will treat your pain and side effects before you go home.  Do not drive for 24 hours if you received a sedative.  You may: ? Feel nauseous and vomit. ? Have a sore throat. ? Have mental slowness. ? Feel cold or shivery. ? Feel sleepy. ? Feel tired. ? Feel sore or achy, even in parts of your body where you did not have surgery. This information is not intended to replace advice given to you by your health care provider. Make sure you discuss any questions you have with  your health care provider. Document Released: 12/23/2007 Document Revised: 02/26/2016 Document Reviewed: 08/30/2015 Elsevier Interactive Patient Education  2018 [place] Anesthesia, Adult, Care After These instructions provide you with information about caring for yourself after your procedure. Your  health care provider may also give you more specific instructions. Your treatment has been planned according to current medical practices, but problems sometimes occur. Call your health care provider if you have any problems or questions after your procedure. What can I expect after the procedure? After the procedure, it is common to have:  Vomiting.  A sore throat.  Mental slowness.  It is common to feel:  Nauseous.  Cold or shivery.  Sleepy.  Tired.  Sore or achy, even in parts of your body where you did not have surgery.  Follow these instructions at home: For at least 24 hours after the procedure:  Do not: ? Participate in activities where you could fall or become injured. ? Drive. ? Use heavy machinery. ? Drink alcohol. ? Take sleeping pills or medicines that cause drowsiness. ? Make important decisions or sign legal documents. ? Take care of children on your own.  Rest. Eating and drinking  If you vomit, drink water, juice, or soup when you can drink without vomiting.  Drink enough fluid to keep your urine clear or pale yellow.  Make sure you have little or no nausea before eating solid foods.  Follow the diet recommended by your health care provider. General instructions  Have a responsible adult stay with you until you are awake and alert.  Return to your normal activities as told by your health care provider. Ask your health care provider what activities are safe for you.  Take over-the-counter and prescription medicines only as told by your health care provider.  If you smoke, do not smoke without supervision.  Keep all follow-up visits as  told by your health care provider. This is important. Contact a health care provider if:  You continue to have nausea or vomiting at home, and medicines are not helpful.  You cannot drink fluids or start eating again.  You cannot urinate after 8-12 hours.  You develop a skin rash.  You have fever.  You have increasing redness at the site of your procedure. Get help right away if:  You have difficulty breathing.  You have chest pain.  You have unexpected bleeding.  You feel that you are having a life-threatening or urgent problem. This information is not intended to replace advice given to you by your health care provider. Make sure you discuss any questions you have with your health care provider. Document Released: 12/22/2000 Document Revised: 02/18/2016 Document Reviewed: 08/30/2015 Elsevier Interactive Patient Education  [employer].

## 2018-07-20 ENCOUNTER — Ambulatory Visit (INDEPENDENT_AMBULATORY_CARE_PROVIDER_SITE_OTHER): Payer: Medicare Other | Admitting: Urology

## 2018-07-20 DIAGNOSIS — N210 Calculus in bladder: Secondary | ICD-10-CM

## 2018-07-21 ENCOUNTER — Ambulatory Visit: Payer: Medicare Other | Admitting: Urology

## 2018-07-22 ENCOUNTER — Other Ambulatory Visit: Payer: Self-pay

## 2018-07-22 ENCOUNTER — Encounter (HOSPITAL_COMMUNITY): Payer: Self-pay

## 2018-07-22 ENCOUNTER — Encounter (HOSPITAL_COMMUNITY)
Admission: RE | Admit: 2018-07-22 | Discharge: 2018-07-22 | Disposition: A | Discharge status: Home / Self Care | Payer: Medicare Other | Source: Ambulatory Visit | Attending: Urology | Admitting: Urology

## 2018-07-22 DIAGNOSIS — Z01812 Encounter for preprocedural laboratory examination: Secondary | ICD-10-CM | POA: Diagnosis not present

## 2018-07-22 LAB — CBC WITH DIFFERENTIAL/PLATELET
Abs Immature Granulocytes: 0.02 10*3/uL (ref 0.00–0.07)
BASOS ABS: 0 10*3/uL (ref 0.0–0.1)
Basophils Relative: 1 %
Eosinophils Absolute: 0.5 10*3/uL (ref 0.0–0.5)
Eosinophils Relative: 7 %
HEMATOCRIT: 43.5 % (ref 39.0–52.0)
HEMOGLOBIN: 13.7 g/dL (ref 13.0–17.0)
Immature Granulocytes: 0 %
LYMPHS ABS: 2 10*3/uL (ref 0.7–4.0)
LYMPHS PCT: 29 %
MCH: 29.6 pg (ref 26.0–34.0)
MCHC: 31.5 g/dL (ref 30.0–36.0)
MCV: 94 fL (ref 80.0–100.0)
Monocytes Absolute: 0.6 10*3/uL (ref 0.1–1.0)
Monocytes Relative: 8 %
NEUTROS ABS: 3.9 10*3/uL (ref 1.7–7.7)
NRBC: 0 % (ref 0.0–0.2)
Neutrophils Relative %: 55 %
Platelets: 131 10*3/uL — ABNORMAL LOW (ref 150–400)
RBC: 4.63 MIL/uL (ref 4.22–5.81)
RDW: 13.2 % (ref 11.5–15.5)
WBC: 7.1 10*3/uL (ref 4.0–10.5)

## 2018-07-22 LAB — BASIC METABOLIC PANEL
Anion gap: 9 (ref 5–15)
BUN: 21 mg/dL (ref 8–23)
CO2: 22 mmol/L (ref 22–32)
Calcium: 8.9 mg/dL (ref 8.9–10.3)
Chloride: 107 mmol/L (ref 98–111)
Creatinine, Ser: 1.35 mg/dL — ABNORMAL HIGH (ref 0.61–1.24)
GFR calc Af Amer: 58 mL/min — ABNORMAL LOW (ref >60)
GFR calc non Af Amer: 50 mL/min — ABNORMAL LOW (ref >60)
Glucose, Bld: 103 mg/dL — ABNORMAL HIGH (ref 70–99)
POTASSIUM: 3.9 mmol/L (ref 3.5–5.1)
SODIUM: 138 mmol/L (ref 135–145)

## 2018-07-26 NOTE — H&P (Signed)
H&P  Chief Complaint:  Bladder stones  History of Present Illness:  77 year old male with multiple medical issues and chronic suprapubic tube for incomplete bladder emptying presents at this time for cystoscopy through suprapubic approach and extraction of several bladder calculi.  Past Medical History:  Diagnosis Date  . Anemia   . Arthritis   . Back pain, chronic   . Bilateral carotid bruits   . C. difficile colitis 09/2011  . CAD (coronary artery disease)   . Carotid artery stenosis   . Cerebrovascular disease   . Colon polyps   . Dyslipidemia   . Dysphagia 10/07/2011  . Encephalopathy   . Gait disorder   . HA (headache)   . High grade dysplasia in colonic adenoma 09/2005  . History of kidney stones   . HTN (hypertension), malignant 10/06/2011  . Hypernatremia   . Hypokalemia   . Hypothyroidism 10/08/2011  . Insomnia   . Junctional rhythm   . Kidney stones   . MS (multiple sclerosis) ([place])   . Neuromuscular disorder (HCC)    MS  . OSA (obstructive sleep apnea)   . Paroxysmal atrial tachycardia ([place])   . Peripheral vascular disease ([place])   . Pneumonia 4 yrs ago  . Pulmonary fibrosis ([place]) 10/06/2011  . Pulmonary nodule 10/08/2011  . PVD (peripheral vascular disease) ([place])   . Sacral ulcer ([place])   . Sleep apnea    cannot tolerate  . Stroke [facility])    left sided weakness  . Suprapubic catheter ([place])   . TIA (transient ischemic attack)   . Tremors of nervous system 10/08/2011  . Urinary tract infection     Past Surgical History:  Procedure Laterality Date  . APPENDECTOMY  09/2005   at time of left hemicolectomy  . BACK SURGERY  1976/1979   lower  . CATARACT EXTRACTION W/PHACO Right 03/08/2018   Procedure: CATARACT EXTRACTION PHACO AND INTRAOCULAR LENS PLACEMENT RIGHT EYE;  Surgeon: Tonny Branch, MD;  Location: [facility];  Service: Ophthalmology;  Laterality: Right;  CDE: 8.86  . CATARACT EXTRACTION W/PHACO Left 04/05/2018   Procedure: CATARACT EXTRACTION PHACO AND INTRAOCULAR LENS  PLACEMENT (IOC);  Surgeon: Tonny Branch, MD;  Location: [facility];  Service: Ophthalmology;  Laterality: Left;  CDE: 7.36  . CHOLECYSTECTOMY     Dr. Tamala Julian  . COLON SURGERY  09/2005   Fleishman: four tubular adenomas, large adenomatous polyp with HIGH GRADE dysplasia  . COLONOSCOPY  11/2004   Dr. Sharol Roussel sessile polyp splenic flexure, 75mm sessile polyp desc colon, tubulovillous adenoma (bx not removed)  . COLONOSCOPY  01/2005   poor prep, polyp could not be found  . COLONOSCOPY  05/2005   with EMR, polypectomy Dr. Olegario Messier, bx showed high grade dysplasia, partially resected  . COLONOSCOPY  09/2005   Dr. Arsenio Loader, [place] ink tattooing, four villous colon polyp (3 had been missed on previous colonoscopies due to limitations of procedures  . COLONOSCOPY  09/2006   normal TI, no polyps  . COLONOSCOPY  10/2007   Dr. Imogene Burn distal mammillations, benign bx, normal TI, random bx neg for microscopic colitis  . CYSTOSCOPY WITH RETROGRADE PYELOGRAM, URETEROSCOPY AND STENT PLACEMENT Left 06/09/2017   Procedure: CYSTOSCOPY WITH LEFT RETROGRADE PYELOGRAM, LEFT URETEROSCOPY, LEFT URETEROSCOPIC STONE EXTRACTION, LEFT URETERAL STENT PLACEMENT;  Surgeon: Franchot Gallo, MD;  Location: [facility];  Service: Urology;  Laterality: Left;  . INGUINAL HERNIA REPAIR  1971   bilateral  . INSERTION OF SUPRAPUBIC CATHETER  06/09/2017   Procedure: EXCHANGE OF SUPRAPUBIC  CATHETER;  Surgeon: Franchot Gallo, MD;  Location: [facility];  Service: Urology;;  . IR NEPHROSTOMY PLACEMENT LEFT  05/26/2017  . KIDNEY STONE SURGERY  09/13/2015  . NEPHROLITHOTOMY Left 09/13/2015   Procedure: LEFT PERCUTANEOUS NEPHROLITHOTOMY ;  Surgeon: Franchot Gallo, MD;  Location: [facility];  Service: Urology;  Laterality: Left;  . SUPRAPUBIC CATHETER INSERTION      Home Medications:  Allergies as of 07/26/2018      Reactions   Tetracyclines & Related Anaphylaxis, Rash   Ciprofloxacin    Trouble swallowing unknown reaction according  to wife       Medication List    Notice   Cannot display discharge medications because the patient has not yet been admitted.     Allergies:  Allergies  Allergen Reactions  . Tetracyclines & Related Anaphylaxis and Rash  . Ciprofloxacin     Trouble swallowing unknown reaction according to wife     Family History  Problem Relation Age of Onset  . Cirrhosis Brother        etoh  . Stroke Mother 15  . Coronary artery disease Father 35  . Heart attack Brother   . Cancer Sister   . Multiple sclerosis Unknown   . Colon cancer Neg Hx     Social History:  reports that he quit smoking about 30 years ago. His smoking use included cigarettes. He has a 25.00 pack-year smoking history. He has never used smokeless tobacco. He reports that he does not drink alcohol or use drugs.  ROS: A complete review of systems was performed.  All systems are negative except for pertinent findings as noted.  Physical Exam:  Vital signs in last 24 hours:   Constitutional:  Alert and oriented, No acute distress Cardiovascular: Regular rate  Respiratory: Normal respiratory effort GI: Abdomen is soft, nontender, nondistended, no abdominal masses Genitourinary: No CVAT. Normal male phallus, testes are descended bilaterally and non-tender and without masses, scrotum is normal in appearance without lesions or masses, perineum is normal on inspection. Lymphatic: No lymphadenopathy Neurologic: Grossly intact, no focal deficits Psychiatric: Normal mood and affect  Laboratory Data:  No results for input(s): WBC, HGB, HCT, PLT in the last 72 hours.  No results for input(s): NA, K, CL, GLUCOSE, BUN, CALCIUM, CREATININE in the last 72 hours.  Invalid input(s): CO3   No results found for this or any previous visit (from the past 24 hour(s)). No results found for this or any previous visit (from the past 240 hour(s)).  Renal Function: Recent Labs    07/22/18 1415  CREATININE 1.35*   Estimated  Creatinine Clearance: 40.9 mL/min (A) (by C-G formula based on SCr of 1.35 mg/dL (H)).  Radiologic Imaging: No results found.  Impression/Assessment:    Multiple bladder calculi  Plan:   cystoscopy, extraction/ cystolitholapaxy

## 2018-07-27 ENCOUNTER — Ambulatory Visit (HOSPITAL_COMMUNITY): Payer: Medicare Other | Admitting: Anesthesiology

## 2018-07-27 ENCOUNTER — Encounter (HOSPITAL_COMMUNITY): Payer: Self-pay | Admitting: *Deleted

## 2018-07-27 ENCOUNTER — Encounter (HOSPITAL_COMMUNITY)
Admission: RE | Disposition: A | Discharge status: Home / Self Care | Payer: Self-pay | Source: Ambulatory Visit | Attending: Urology

## 2018-07-27 ENCOUNTER — Ambulatory Visit (HOSPITAL_COMMUNITY)
Admission: RE | Admit: 2018-07-27 | Discharge: 2018-07-27 | Disposition: A | Discharge status: Home / Self Care | Payer: Medicare Other | Source: Ambulatory Visit | Attending: Urology | Admitting: Urology

## 2018-07-27 DIAGNOSIS — R51 Headache: Secondary | ICD-10-CM | POA: Insufficient documentation

## 2018-07-27 DIAGNOSIS — Z9842 Cataract extraction status, left eye: Secondary | ICD-10-CM | POA: Insufficient documentation

## 2018-07-27 DIAGNOSIS — D649 Anemia, unspecified: Secondary | ICD-10-CM | POA: Diagnosis not present

## 2018-07-27 DIAGNOSIS — R251 Tremor, unspecified: Secondary | ICD-10-CM | POA: Insufficient documentation

## 2018-07-27 DIAGNOSIS — N210 Calculus in bladder: Secondary | ICD-10-CM | POA: Insufficient documentation

## 2018-07-27 DIAGNOSIS — E876 Hypokalemia: Secondary | ICD-10-CM | POA: Insufficient documentation

## 2018-07-27 DIAGNOSIS — I739 Peripheral vascular disease, unspecified: Secondary | ICD-10-CM | POA: Diagnosis not present

## 2018-07-27 DIAGNOSIS — I110 Hypertensive heart disease with heart failure: Secondary | ICD-10-CM | POA: Insufficient documentation

## 2018-07-27 DIAGNOSIS — R269 Unspecified abnormalities of gait and mobility: Secondary | ICD-10-CM | POA: Insufficient documentation

## 2018-07-27 DIAGNOSIS — G4700 Insomnia, unspecified: Secondary | ICD-10-CM | POA: Diagnosis not present

## 2018-07-27 DIAGNOSIS — N319 Neuromuscular dysfunction of bladder, unspecified: Secondary | ICD-10-CM | POA: Diagnosis not present

## 2018-07-27 DIAGNOSIS — I2510 Atherosclerotic heart disease of native coronary artery without angina pectoris: Secondary | ICD-10-CM | POA: Diagnosis not present

## 2018-07-27 DIAGNOSIS — Z87442 Personal history of urinary calculi: Secondary | ICD-10-CM | POA: Diagnosis not present

## 2018-07-27 DIAGNOSIS — M1990 Unspecified osteoarthritis, unspecified site: Secondary | ICD-10-CM | POA: Insufficient documentation

## 2018-07-27 DIAGNOSIS — Z809 Family history of malignant neoplasm, unspecified: Secondary | ICD-10-CM | POA: Insufficient documentation

## 2018-07-27 DIAGNOSIS — Z8673 Personal history of transient ischemic attack (TIA), and cerebral infarction without residual deficits: Secondary | ICD-10-CM | POA: Insufficient documentation

## 2018-07-27 DIAGNOSIS — E785 Hyperlipidemia, unspecified: Secondary | ICD-10-CM | POA: Diagnosis not present

## 2018-07-27 DIAGNOSIS — Z820 Family history of epilepsy and other diseases of the nervous system: Secondary | ICD-10-CM | POA: Insufficient documentation

## 2018-07-27 DIAGNOSIS — M549 Dorsalgia, unspecified: Secondary | ICD-10-CM | POA: Insufficient documentation

## 2018-07-27 DIAGNOSIS — I6529 Occlusion and stenosis of unspecified carotid artery: Secondary | ICD-10-CM | POA: Insufficient documentation

## 2018-07-27 DIAGNOSIS — Z86010 Personal history of colonic polyps: Secondary | ICD-10-CM | POA: Diagnosis not present

## 2018-07-27 DIAGNOSIS — G8929 Other chronic pain: Secondary | ICD-10-CM | POA: Diagnosis not present

## 2018-07-27 DIAGNOSIS — G9340 Encephalopathy, unspecified: Secondary | ICD-10-CM | POA: Insufficient documentation

## 2018-07-27 DIAGNOSIS — G35 Multiple sclerosis: Secondary | ICD-10-CM | POA: Insufficient documentation

## 2018-07-27 DIAGNOSIS — Z87891 Personal history of nicotine dependence: Secondary | ICD-10-CM | POA: Insufficient documentation

## 2018-07-27 DIAGNOSIS — E870 Hyperosmolality and hypernatremia: Secondary | ICD-10-CM | POA: Insufficient documentation

## 2018-07-27 DIAGNOSIS — G4733 Obstructive sleep apnea (adult) (pediatric): Secondary | ICD-10-CM | POA: Diagnosis not present

## 2018-07-27 DIAGNOSIS — E039 Hypothyroidism, unspecified: Secondary | ICD-10-CM | POA: Diagnosis not present

## 2018-07-27 DIAGNOSIS — Z8379 Family history of other diseases of the digestive system: Secondary | ICD-10-CM | POA: Insufficient documentation

## 2018-07-27 DIAGNOSIS — R1310 Dysphagia, unspecified: Secondary | ICD-10-CM | POA: Diagnosis not present

## 2018-07-27 DIAGNOSIS — J8410 Pulmonary fibrosis, unspecified: Secondary | ICD-10-CM | POA: Diagnosis not present

## 2018-07-27 DIAGNOSIS — Z9049 Acquired absence of other specified parts of digestive tract: Secondary | ICD-10-CM | POA: Insufficient documentation

## 2018-07-27 DIAGNOSIS — I509 Heart failure, unspecified: Secondary | ICD-10-CM | POA: Insufficient documentation

## 2018-07-27 DIAGNOSIS — I471 Supraventricular tachycardia: Secondary | ICD-10-CM | POA: Insufficient documentation

## 2018-07-27 DIAGNOSIS — Z87440 Personal history of urinary (tract) infections: Secondary | ICD-10-CM | POA: Insufficient documentation

## 2018-07-27 DIAGNOSIS — Z8249 Family history of ischemic heart disease and other diseases of the circulatory system: Secondary | ICD-10-CM | POA: Insufficient documentation

## 2018-07-27 DIAGNOSIS — L98499 Non-pressure chronic ulcer of skin of other sites with unspecified severity: Secondary | ICD-10-CM | POA: Insufficient documentation

## 2018-07-27 DIAGNOSIS — Z881 Allergy status to other antibiotic agents status: Secondary | ICD-10-CM | POA: Insufficient documentation

## 2018-07-27 DIAGNOSIS — Z9841 Cataract extraction status, right eye: Secondary | ICD-10-CM | POA: Insufficient documentation

## 2018-07-27 DIAGNOSIS — Z823 Family history of stroke: Secondary | ICD-10-CM | POA: Insufficient documentation

## 2018-07-27 HISTORY — PX: CYSTOSCOPY WITH LITHOLAPAXY: SHX1425

## 2018-07-27 SURGERY — CYSTOSCOPY, WITH BLADDER CALCULUS LITHOLAPAXY
Anesthesia: Monitor Anesthesia Care

## 2018-07-27 MED ORDER — WATER FOR IRRIGATION, STERILE IR SOLN
Status: DC | PRN
  Administered 2018-07-27: 1000 mL

## 2018-07-27 MED ORDER — MIDAZOLAM HCL 5 MG/5ML IJ SOLN
INTRAMUSCULAR | Status: DC | PRN
  Administered 2018-07-27 (×2): 1 mg via INTRAVENOUS

## 2018-07-27 MED ORDER — GLYCOPYRROLATE 0.2 MG/ML IJ SOLN
INTRAMUSCULAR | Status: AC
  Filled 2018-07-27: qty 1

## 2018-07-27 MED ORDER — MIDAZOLAM HCL 2 MG/2ML IJ SOLN: 0.5000 mg | Freq: Once | INTRAMUSCULAR | Status: DC | PRN

## 2018-07-27 MED ORDER — STERILE WATER FOR IRRIGATION IR SOLN
Status: DC | PRN
  Administered 2018-07-27: 3000 mL

## 2018-07-27 MED ORDER — PROPOFOL 10 MG/ML IV BOLUS
INTRAVENOUS | Status: AC
  Filled 2018-07-27: qty 20

## 2018-07-27 MED ORDER — HYDROMORPHONE HCL 1 MG/ML IJ SOLN: 0.2500 mg | INTRAMUSCULAR | Status: DC | PRN

## 2018-07-27 MED ORDER — PROMETHAZINE HCL 25 MG/ML IJ SOLN: 6.2500 mg | INTRAMUSCULAR | Status: DC | PRN

## 2018-07-27 MED ORDER — HYDROCODONE-ACETAMINOPHEN 7.5-325 MG PO TABS: 1.0000 | ORAL_TABLET | Freq: Once | ORAL | Status: DC | PRN

## 2018-07-27 MED ORDER — MIDAZOLAM HCL 2 MG/2ML IJ SOLN
INTRAMUSCULAR | Status: AC
  Filled 2018-07-27: qty 2

## 2018-07-27 MED ORDER — PROPOFOL 500 MG/50ML IV EMUL
INTRAVENOUS | Status: DC | PRN
  Administered 2018-07-27: 75 ug/kg/min via INTRAVENOUS

## 2018-07-27 MED ORDER — FENTANYL CITRATE (PF) 100 MCG/2ML IJ SOLN
INTRAMUSCULAR | Status: AC
  Filled 2018-07-27: qty 2

## 2018-07-27 MED ORDER — LACTATED RINGERS IV SOLN
INTRAVENOUS | Status: DC | PRN
  Administered 2018-07-27: 12:00:00 via INTRAVENOUS

## 2018-07-27 MED ORDER — GENTAMICIN SULFATE 40 MG/ML IJ SOLN
5.0000 mg/kg | INTRAVENOUS | Status: AC
  Administered 2018-07-27: 13:00:00 via INTRAVENOUS
  Administered 2018-07-27: 304 mg via INTRAVENOUS
  Filled 2018-07-27: qty 7.5

## 2018-07-27 MED ORDER — GLYCOPYRROLATE 0.2 MG/ML IJ SOLN
INTRAMUSCULAR | Status: DC | PRN
  Administered 2018-07-27: 0.2 mg via INTRAVENOUS

## 2018-07-27 MED ORDER — LACTATED RINGERS IV SOLN: INTRAVENOUS | Status: DC

## 2018-07-27 MED ORDER — CEFAZOLIN SODIUM-DEXTROSE 2-4 GM/100ML-% IV SOLN
2.0000 g | INTRAVENOUS | Status: AC
  Administered 2018-07-27: 2 g via INTRAVENOUS
  Filled 2018-07-27: qty 100

## 2018-07-27 SURGICAL SUPPLY — 23 items
BAG DRAIN URO TABLE W/ADPT NS (BAG) ×3 IMPLANT
BAG DRN 8 ADPR NS SKTRN CSTL (BAG) ×1
BAG URINE LEG 500ML (DRAIN) ×3 IMPLANT
BASKET PULM ZERO TIP 16MMX120 (BASKET) ×1
BASKET PULM ZERO TIP 16X120 (BASKET) ×1 IMPLANT
BSKT SPEC RTRVL ZERO TP 120X16 (BASKET) ×1
CATH FOLEY 2WAY SLVR  5CC 24FR (CATHETERS) ×2
CATH FOLEY 2WAY SLVR 5CC 24FR (CATHETERS) IMPLANT
CLOTH BEACON ORANGE TIMEOUT ST (SAFETY) ×3 IMPLANT
DRAPE LINGEMAN PERC (DRAPES) ×2 IMPLANT
GLOVE BIOGEL PI IND STRL 8 (GLOVE) ×1 IMPLANT
GLOVE BIOGEL PI INDICATOR 8 (GLOVE) ×2
GOWN STRL REUS W/ TWL XL LVL3 (GOWN DISPOSABLE) ×1 IMPLANT
GOWN STRL REUS W/TWL LRG LVL3 (GOWN DISPOSABLE) ×3 IMPLANT
GOWN STRL REUS W/TWL XL LVL3 (GOWN DISPOSABLE) ×3
KIT TURNOVER CYSTO (KITS) ×3 IMPLANT
MANIFOLD NEPTUNE II (INSTRUMENTS) ×3 IMPLANT
PACK CYSTO (CUSTOM PROCEDURE TRAY) ×3 IMPLANT
PAD ARMBOARD 7.5X6 YLW CONV (MISCELLANEOUS) ×3 IMPLANT
TOWEL OR 17X26 4PK STRL BLUE (TOWEL DISPOSABLE) ×3 IMPLANT
WATER STERILE IRR 3000ML UROMA (IV SOLUTION) ×3 IMPLANT
WATER STERILE IRR 500ML POUR (IV SOLUTION) ×2 IMPLANT
YANKAUER SUCT BULB TIP 10FT TU (MISCELLANEOUS) ×4 IMPLANT

## 2018-07-27 NOTE — Discharge Instructions (Signed)
1. You may see some blood in the urine and may have some burning with urination for 48-72 hours. You also may notice that you have to urinate more frequently or urgently after your procedure which is normal.  2. You should call should you develop an inability urinate, fever > 101, persistent nausea and vomiting that prevents you from eating or drinking to stay hydrated.  3. If you have a catheter, you will be taught how to take care of the catheter by the nursing staff prior to discharge from the hospital.  You may periodically feel a strong urge to void with the catheter in place.  This is a bladder spasm and most often can occur when having a bowel movement or moving around. It is typically self-limited and usually will stop after a few minutes.  You may use some Vaseline or Neosporin around the tip of the catheter to reduce friction at the tip of the penis. You may also see some blood in the urine.  A very small amount of blood can make the urine look quite red.  As long as the catheter is draining well, there usually is not a problem.  However, if the catheter is not draining well and is bloody, you should call the office ([phone]) to notify us.  

## 2018-07-27 NOTE — Anesthesia Postprocedure Evaluation (Signed)
Anesthesia Post Note  Patient: Jordan Ward  Procedure(s) Performed: CYSTOSCOPY WITH LITHOLAPAXY VIA  SUPRAPUBIC TUBE (N/A )  Patient location during evaluation: PACU Anesthesia Type: MAC Level of consciousness: awake and alert and oriented Pain management: pain level controlled Vital Signs Assessment: post-procedure vital signs reviewed and stable Respiratory status: spontaneous breathing Cardiovascular status: stable Postop Assessment: no apparent nausea or vomiting Anesthetic complications: no     Last Vitals:  Vitals:   07/27/18 1058 07/27/18 1253  BP: (!) 176/87 (P) 130/65  Pulse: (!) 50   Resp: 18 (P) 14  Temp: 36.9 C (!) (P) 36.4 C  SpO2: 96% (P) 99%    Last Pain:  Vitals:   07/27/18 1058  PainSc: 0-No pain                 ADAMS, AMY A

## 2018-07-27 NOTE — Op Note (Signed)
Preoperative diagnosis: Bladder calculi, approximately 12 mm maximum diameter.  3 in number  Postoperative diagnosis: Same  Principal procedure: Cystolitholapaxy bladder calculi less than 20 mm in size  Surgeon: Neeka Urista  Anesthesia: MAC  Complications: None  Specimen: Bladder calculi  Drains: 24 French Foley catheter as suprapubic tube.  Indications: 77 year old male with neurogenic bladder.  He has an indwelling suprapubic tube.  During recent change she was noted to have bladder calculi.  He presents at this time for cystoscopy and management of these.  He has been treated appropriately with preoperative oral antibiotics.  Description of procedure: The patient was properly identified in the holding area.  He received preoperative Cipro and gentamicin.  He was taken to the operating room where he was placed in the supine position.  Sedation was administered.  His suprapubic site was prepped and draped.  Proper timeout was performed.  70 French cystoscope was passed into his bladder through the suprapubic tube site.  Urothelium appeared normal.  3 stones were layering posteriorly on the bladder [place].  These were grasped with the nitinol 0 tip basket and extracted through the suprapubic tube tract without difficulty.  No other abnormalities or stones were noted in the bladder.  At this point the scope was removed.  Suprapubic tube replaced with 24 French Foley.  This was hooked to dependent drainage.  The patient was then taken to the PACU in stable condition.  He tolerated the procedure well.

## 2018-07-27 NOTE — Anesthesia Procedure Notes (Signed)
Procedure Name: MAC Date/Time: 07/27/2018 12:16 PM Performed by: Andree Elk Vegas Coffin A, CRNA Pre-anesthesia Checklist: Patient identified, Emergency Drugs available, Suction available, Patient being monitored and Timeout performed Oxygen Delivery Method: Simple face mask

## 2018-07-27 NOTE — Interval H&P Note (Signed)
History and Physical Interval Note:  07/27/2018 12:13 PM  Jordan Ward  has presented today for surgery, with the diagnosis of BLADDER STONES  The various methods of treatment have been discussed with the patient and family. After consideration of risks, benefits and other options for treatment, the patient has consented to  Procedure(s): CYSTOSCOPY WITH LITHOLAPAXY VIA  SUPRAPUBIC TUBE (N/A) HOLMIUM LASER APPLICATION (N/A) as a surgical intervention .  The patient's history has been reviewed, patient examined, no change in status, stable for surgery.  I have reviewed the patient's chart and labs.  Questions were answered to the patient's satisfaction.     Lillette Boxer Alyne Martinson

## 2018-07-27 NOTE — Anesthesia Preprocedure Evaluation (Signed)
Anesthesia Evaluation  Patient identified by MRN, date of birth, ID band Patient awake    Reviewed: Allergy & Precautions, NPO status , Patient's Chart, lab work & pertinent test results  Airway Mallampati: II  TM Distance: >3 FB Neck ROM: Full    Dental no notable dental hx. (+) Poor Dentition, Missing   Pulmonary sleep apnea , pneumonia, former smoker,    Pulmonary exam normal breath sounds clear to auscultation       Cardiovascular Exercise Tolerance: Poor hypertension, + CAD, + Peripheral Vascular Disease and +CHF  Normal cardiovascular examII Rhythm:Regular Rate:Normal     Neuro/Psych  Headaches, Reports having MS uses walker -can get ~1 block TIA Neuromuscular disease negative psych ROS   GI/Hepatic negative GI ROS, Neg liver ROS,   Endo/Other  Hypothyroidism   Renal/GU Renal InsufficiencyRenal diseaseKnown SPT recently changed , plan is to go through SPT  Per pt   negative genitourinary   Musculoskeletal  (+) Arthritis ,   Abdominal   Peds negative pediatric ROS (+)  Hematology negative hematology ROS (+) anemia ,   Anesthesia Other Findings   Reproductive/Obstetrics negative OB ROS                             Anesthesia Physical Anesthesia Plan  ASA: III  Anesthesia Plan: MAC   Post-op Pain Management:    Induction: Intravenous  PONV Risk Score and Plan:   Airway Management Planned: Nasal Cannula and Simple Face Mask  Additional Equipment:   Intra-op Plan:   Post-operative Plan: Extubation in OR  Informed Consent: I have reviewed the patients History and Physical, chart, labs and discussed the procedure including the risks, benefits and alternatives for the proposed anesthesia with the patient or authorized representative who has indicated his/her understanding and acceptance.   Dental advisory given  Plan Discussed with: CRNA  Anesthesia Plan Comments: (MAC vs  LMA as needed (vs ETT))        Anesthesia Quick Evaluation

## 2018-07-27 NOTE — Transfer of Care (Signed)
Immediate Anesthesia Transfer of Care Note  Patient: Jordan Ward  Procedure(s) Performed: CYSTOSCOPY WITH LITHOLAPAXY VIA  SUPRAPUBIC TUBE (N/A )  Patient Location: PACU  Anesthesia Type:MAC  Level of Consciousness: awake, alert , oriented and patient cooperative  Airway & Oxygen Therapy: Patient Spontanous Breathing  Post-op Assessment: Report given to RN and Post -op Vital signs reviewed and stable  Post vital signs: Reviewed and stable  Last Vitals:  Vitals Value Taken Time  BP 130/65 07/27/2018 12:52 PM  Temp    Pulse 56 07/27/2018 12:55 PM  Resp 12 07/27/2018 12:55 PM  SpO2 97 % 07/27/2018 12:55 PM  Vitals shown include unvalidated device data.  Last Pain:  Vitals:   07/27/18 1058  PainSc: 0-No pain      Patients Stated Pain Goal: 4 (52/77/82 4235)  Complications: No apparent anesthesia complications

## 2018-07-28 ENCOUNTER — Encounter (HOSPITAL_COMMUNITY): Payer: Self-pay | Admitting: Urology

## 2018-08-17 ENCOUNTER — Ambulatory Visit (INDEPENDENT_AMBULATORY_CARE_PROVIDER_SITE_OTHER): Payer: Medicare Other | Admitting: Neurology

## 2018-08-17 ENCOUNTER — Other Ambulatory Visit: Payer: Self-pay

## 2018-08-17 ENCOUNTER — Encounter: Payer: Self-pay | Admitting: Neurology

## 2018-08-17 VITALS — BP 182/79 | HR 54 | Resp 20 | Ht 65.0 in | Wt 133.0 lb

## 2018-08-17 DIAGNOSIS — R269 Unspecified abnormalities of gait and mobility: Secondary | ICD-10-CM | POA: Diagnosis not present

## 2018-08-17 DIAGNOSIS — I679 Cerebrovascular disease, unspecified: Secondary | ICD-10-CM

## 2018-08-17 HISTORY — DX: Cerebrovascular disease, unspecified: I67.9

## 2018-08-17 NOTE — Progress Notes (Signed)
Reason for visit: Cerebrovascular disease, gait disturbance  Jordan Ward is an 77 y.o. male  History of present illness:  Jordan Ward is a 77 year old right-handed white male with a history of a chronic gait disorder.  The patient has been using a walker outside of the house, he usually does not use a cane or a walker inside the house, he has not had any falls in several years.  He does not exercise much, he is relatively inactive.  He may have an occasional headache, more recently he has gotten about 1 a week on average, the headaches are not severe and he is able to control them fairly well.  The patient may have headaches either on the right or the left side of the head.  He is sleeping fairly well, he will take an occasional diazepam for anxiety.  He returns to this office for an evaluation.  Past Medical History:  Diagnosis Date  . Anemia   . Arthritis   . Back pain, chronic   . Bilateral carotid bruits   . C. difficile colitis 09/2011  . CAD (coronary artery disease)   . Carotid artery stenosis   . Cerebrovascular disease   . Colon polyps   . Dyslipidemia   . Dysphagia 10/07/2011  . Encephalopathy   . Gait disorder   . HA (headache)   . High grade dysplasia in colonic adenoma 09/2005  . History of kidney stones   . HTN (hypertension), malignant 10/06/2011  . Hypernatremia   . Hypokalemia   . Hypothyroidism 10/08/2011  . Insomnia   . Junctional rhythm   . Kidney stones   . MS (multiple sclerosis) ([place])   . Neuromuscular disorder (HCC)    MS  . OSA (obstructive sleep apnea)   . Paroxysmal atrial tachycardia ([place])   . Peripheral vascular disease ([place])   . Pneumonia 4 yrs ago  . Pulmonary fibrosis ([place]) 10/06/2011  . Pulmonary nodule 10/08/2011  . PVD (peripheral vascular disease) ([place])   . Sacral ulcer ([place])   . Sleep apnea    cannot tolerate  . Stroke [facility])    left sided weakness  . Suprapubic catheter ([place])   . TIA (transient ischemic attack)   . Tremors of nervous  system 10/08/2011  . Urinary tract infection     Past Surgical History:  Procedure Laterality Date  . APPENDECTOMY  09/2005   at time of left hemicolectomy  . BACK SURGERY  1976/1979   lower  . CATARACT EXTRACTION W/PHACO Right 03/08/2018   Procedure: CATARACT EXTRACTION PHACO AND INTRAOCULAR LENS PLACEMENT RIGHT EYE;  Surgeon: Tonny Branch, MD;  Location: [facility];  Service: Ophthalmology;  Laterality: Right;  CDE: 8.86  . CATARACT EXTRACTION W/PHACO Left 04/05/2018   Procedure: CATARACT EXTRACTION PHACO AND INTRAOCULAR LENS PLACEMENT (IOC);  Surgeon: Tonny Branch, MD;  Location: [facility];  Service: Ophthalmology;  Laterality: Left;  CDE: 7.36  . CHOLECYSTECTOMY     Dr. Tamala Julian  . COLON SURGERY  09/2005   Fleishman: four tubular adenomas, large adenomatous polyp with HIGH GRADE dysplasia  . COLONOSCOPY  11/2004   Dr. Sharol Roussel sessile polyp splenic flexure, 56mm sessile polyp desc colon, tubulovillous adenoma (bx not removed)  . COLONOSCOPY  01/2005   poor prep, polyp could not be found  . COLONOSCOPY  05/2005   with EMR, polypectomy Dr. Olegario Messier, bx showed high grade dysplasia, partially resected  . COLONOSCOPY  09/2005   Dr. Arsenio Loader, [place] ink tattooing, four villous colon  polyp (3 had been missed on previous colonoscopies due to limitations of procedures  . COLONOSCOPY  09/2006   normal TI, no polyps  . COLONOSCOPY  10/2007   Dr. Imogene Burn distal mammillations, benign bx, normal TI, random bx neg for microscopic colitis  . CYSTOSCOPY WITH LITHOLAPAXY N/A 07/27/2018   Procedure: CYSTOSCOPY WITH LITHOLAPAXY VIA  SUPRAPUBIC TUBE;  Surgeon: Franchot Gallo, MD;  Location: [facility];  Service: Urology;  Laterality: N/A;  . CYSTOSCOPY WITH RETROGRADE PYELOGRAM, URETEROSCOPY AND STENT PLACEMENT Left 06/09/2017   Procedure: CYSTOSCOPY WITH LEFT RETROGRADE PYELOGRAM, LEFT URETEROSCOPY, LEFT URETEROSCOPIC STONE EXTRACTION, LEFT URETERAL STENT PLACEMENT;  Surgeon: Franchot Gallo, MD;  Location:  [facility];  Service: Urology;  Laterality: Left;  . INGUINAL HERNIA REPAIR  1971   bilateral  . INSERTION OF SUPRAPUBIC CATHETER  06/09/2017   Procedure: EXCHANGE OF SUPRAPUBIC CATHETER;  Surgeon: Franchot Gallo, MD;  Location: [facility];  Service: Urology;;  . IR NEPHROSTOMY PLACEMENT LEFT  05/26/2017  . KIDNEY STONE SURGERY  09/13/2015  . NEPHROLITHOTOMY Left 09/13/2015   Procedure: LEFT PERCUTANEOUS NEPHROLITHOTOMY ;  Surgeon: Franchot Gallo, MD;  Location: [facility];  Service: Urology;  Laterality: Left;  . SUPRAPUBIC CATHETER INSERTION      Family History  Problem Relation Age of Onset  . Cirrhosis Brother        etoh  . Stroke Mother 26  . Coronary artery disease Father 73  . Heart attack Brother   . Cancer Sister   . Multiple sclerosis Unknown   . Colon cancer Neg Hx     Social history:  reports that he quit smoking about 30 years ago. His smoking use included cigarettes. He has a 25.00 pack-year smoking history. He has never used smokeless tobacco. He reports that he does not drink alcohol or use drugs.    Allergies  Allergen Reactions  . Tetracyclines & Related Anaphylaxis and Rash  . Ciprofloxacin     Trouble swallowing unknown reaction according to wife     Medications:  Prior to Admission medications   Medication Sig Start Date End Date Taking? Authorizing Provider  carvedilol (COREG) 6.25 MG tablet Take 1 tablet (6.25 mg total) by mouth 2 (two) times daily with a meal. 07/26/14  Yes Elmahi, Mutaz, MD  diazepam (VALIUM) 5 MG tablet Take 1 tablet (5 mg total) by mouth 2 (two) times daily. 06/16/18  Yes Ward Givens, NP  HYDROcodone-acetaminophen (NORCO) 7.5-325 MG per tablet Take 1 tablet by mouth 2 (two) times daily. Max APAP 3 GM IN 24 HOURS FROM ALL SOURCES Patient taking differently: Take 1 tablet by mouth 4 (four) times daily as needed for moderate pain.  08/22/14  Yes Blanchie Serve, MD  ibuprofen (ADVIL,MOTRIN) 200 MG tablet Take 200 mg by mouth daily as  needed for headache or moderate pain.    Yes [provider]  levothyroxine (SYNTHROID, LEVOTHROID) 88 MCG tablet Take 88 mcg by mouth daily before breakfast.   Yes [provider]  mirabegron ER (MYRBETRIQ) 25 MG TB24 tablet Take 25 mg by mouth daily as needed (bladder spasms).   Yes [provider]  potassium chloride SA (K-DUR,KLOR-CON) 20 MEQ tablet Take 20 mEq by mouth daily.   Yes Rexene Alberts, MD  trimethoprim-polymyxin b (POLYTRIM) ophthalmic solution Place 1 drop into the left eye See admin instructions. Instil 1 drop into the left eye 3 times daily on the day of the eye injection and 4 times daily for the day after, eye injection every 2  months 12/03/17  Yes [provider]  verapamil (CALAN) 40 MG tablet Take 40 mg by mouth 2 (two) times daily.   Yes [provider]    ROS:  Out of a complete 14 system review of symptoms, the patient complains only of the following symptoms, and all other reviewed systems are negative.  Double vision, blurred vision Runny nose, difficulty swallowing Cold intolerance Snoring Frequent infections Headache, speech difficulty  Blood pressure (!) 182/79, pulse (!) 54, resp. rate 20, height 5\' 5"  (1.651 m), weight 133 lb (60.3 kg).  Physical Exam  General: The patient is alert and cooperative at the time of the examination.  Skin: No significant peripheral edema is noted.   Neurologic Exam  Mental status: The patient is alert and oriented x 3 at the time of the examination. The patient has apparent normal recent and remote memory, with an apparently normal attention span and concentration ability.   Cranial nerves: Facial symmetry is present. Speech is very minimally dysarthric, not aphasic. Extraocular movements are full. Visual fields are full.  Motor: The patient has good strength in all 4 extremities.  Sensory examination: Soft touch sensation is symmetric on the face, arms, and  legs.  Coordination: The patient has good finger-nose-finger and heel-to-shin bilaterally.  Gait and station: The patient has a slightly wide-based, unsteady gait.  The patient is able to walk independently, usually uses a walker.  Tandem gait was not attempted.  Romberg is negative.  Reflexes: Deep tendon reflexes are symmetric.   Assessment/Plan:  1.  Cerebrovascular disease  2.  Gait disturbance  3.  Intermittent headache  The patient is relatively stable from a neurologic standpoint.  He does not require medications currently for his headache.  He will continue on the diazepam if needed.  He will follow-up in 1 year, sooner if needed.  Jill Alexanders MD 08/17/2018 10:41 AM  [facility] [address] [place], [place] [address]  Phone [phone] Fax [phone]

## 2018-08-20 ENCOUNTER — Ambulatory Visit (INDEPENDENT_AMBULATORY_CARE_PROVIDER_SITE_OTHER): Payer: Medicare Other | Admitting: Urology

## 2018-08-20 DIAGNOSIS — N210 Calculus in bladder: Secondary | ICD-10-CM

## 2018-08-20 DIAGNOSIS — N312 Flaccid neuropathic bladder, not elsewhere classified: Secondary | ICD-10-CM

## 2018-08-24 ENCOUNTER — Ambulatory Visit (INDEPENDENT_AMBULATORY_CARE_PROVIDER_SITE_OTHER): Payer: Medicare Other | Admitting: Urology

## 2018-08-24 DIAGNOSIS — N210 Calculus in bladder: Secondary | ICD-10-CM | POA: Diagnosis not present

## 2018-08-24 DIAGNOSIS — N312 Flaccid neuropathic bladder, not elsewhere classified: Secondary | ICD-10-CM

## 2018-09-02 ENCOUNTER — Encounter (INDEPENDENT_AMBULATORY_CARE_PROVIDER_SITE_OTHER): Payer: Medicare Other | Admitting: Ophthalmology

## 2018-09-02 DIAGNOSIS — H34832 Tributary (branch) retinal vein occlusion, left eye, with macular edema: Secondary | ICD-10-CM

## 2018-09-02 DIAGNOSIS — I10 Essential (primary) hypertension: Secondary | ICD-10-CM

## 2018-09-02 DIAGNOSIS — H35033 Hypertensive retinopathy, bilateral: Secondary | ICD-10-CM | POA: Diagnosis not present

## 2018-09-02 DIAGNOSIS — H43813 Vitreous degeneration, bilateral: Secondary | ICD-10-CM | POA: Diagnosis not present

## 2018-09-10 ENCOUNTER — Ambulatory Visit (INDEPENDENT_AMBULATORY_CARE_PROVIDER_SITE_OTHER): Payer: Medicare Other | Admitting: Urology

## 2018-09-10 DIAGNOSIS — N210 Calculus in bladder: Secondary | ICD-10-CM | POA: Diagnosis not present

## 2018-09-14 IMAGING — DX DG ABDOMEN 2V
3 series · 3 of 3 positions shown · non-contrast
Comparison: Single-view of the abdomen and CT abdomen and pelvis
07/11/2017.

CLINICAL DATA: Abdominal pain since last night.  Diarrhea.

EXAM:
ABDOMEN - 2 VIEW

[abdomen erect]
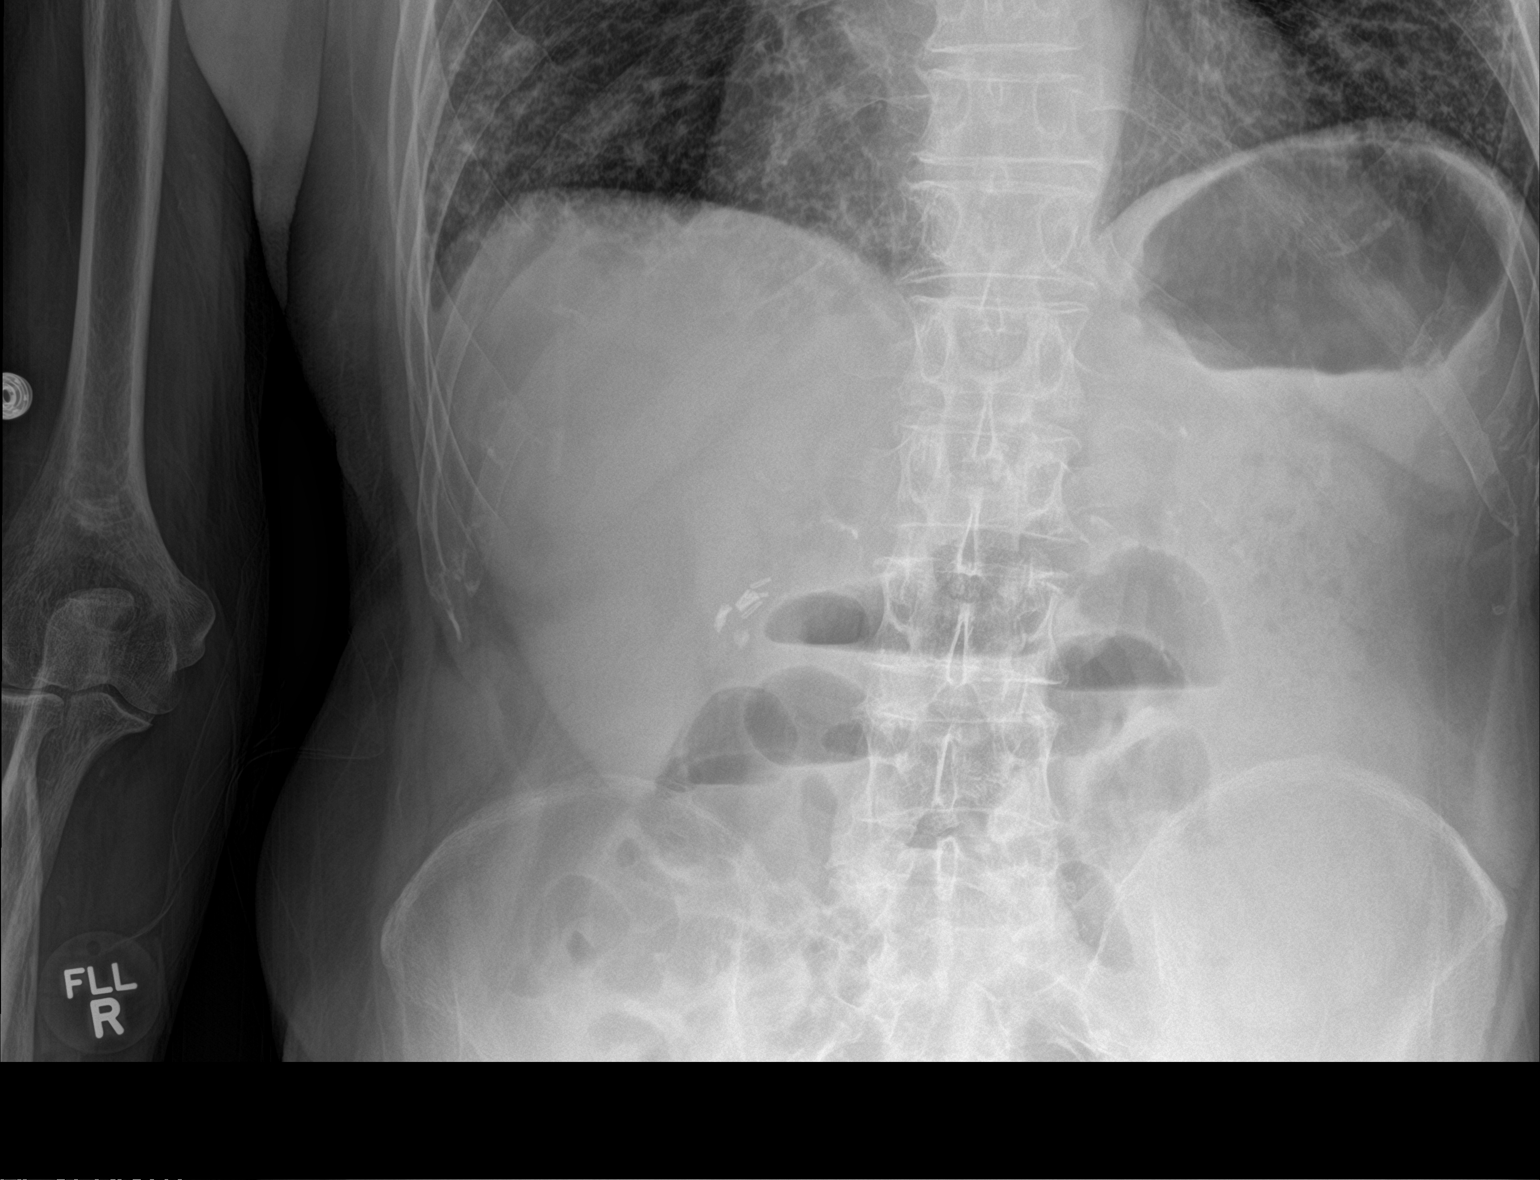

[abdomen supine (1 of 2)]
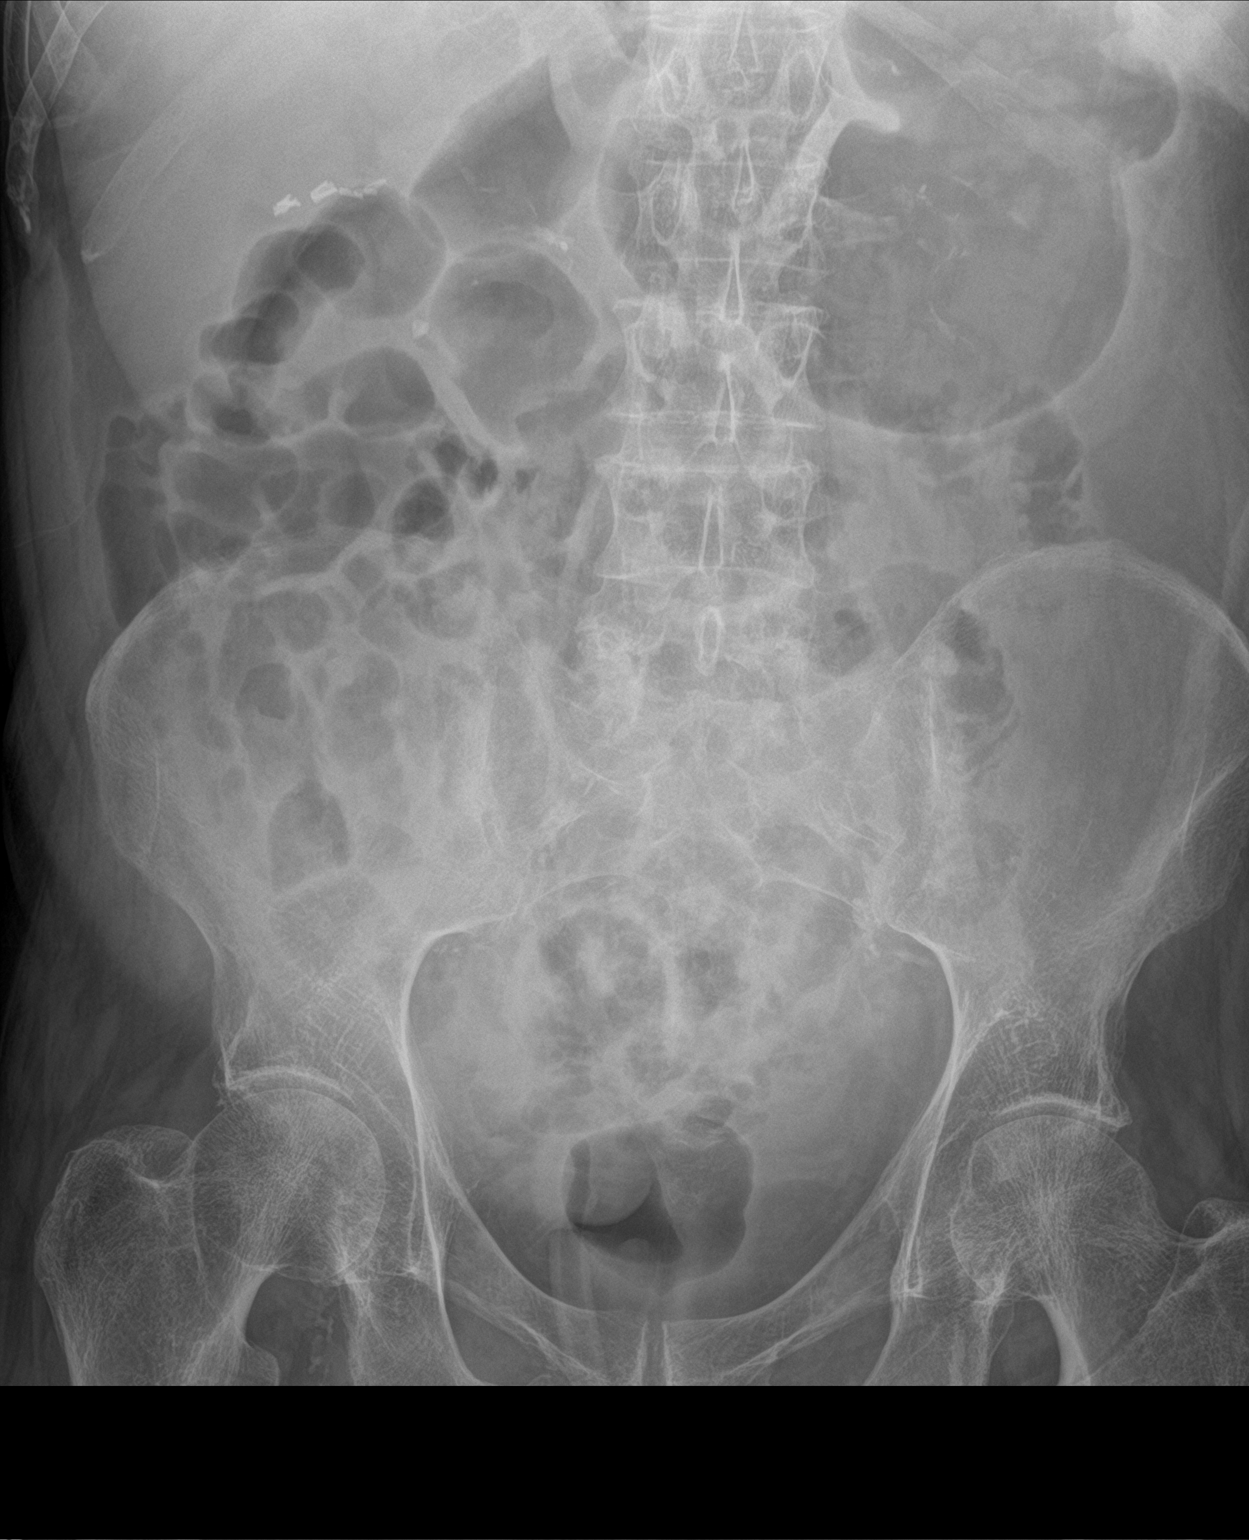

[abdomen supine (2 of 2)]
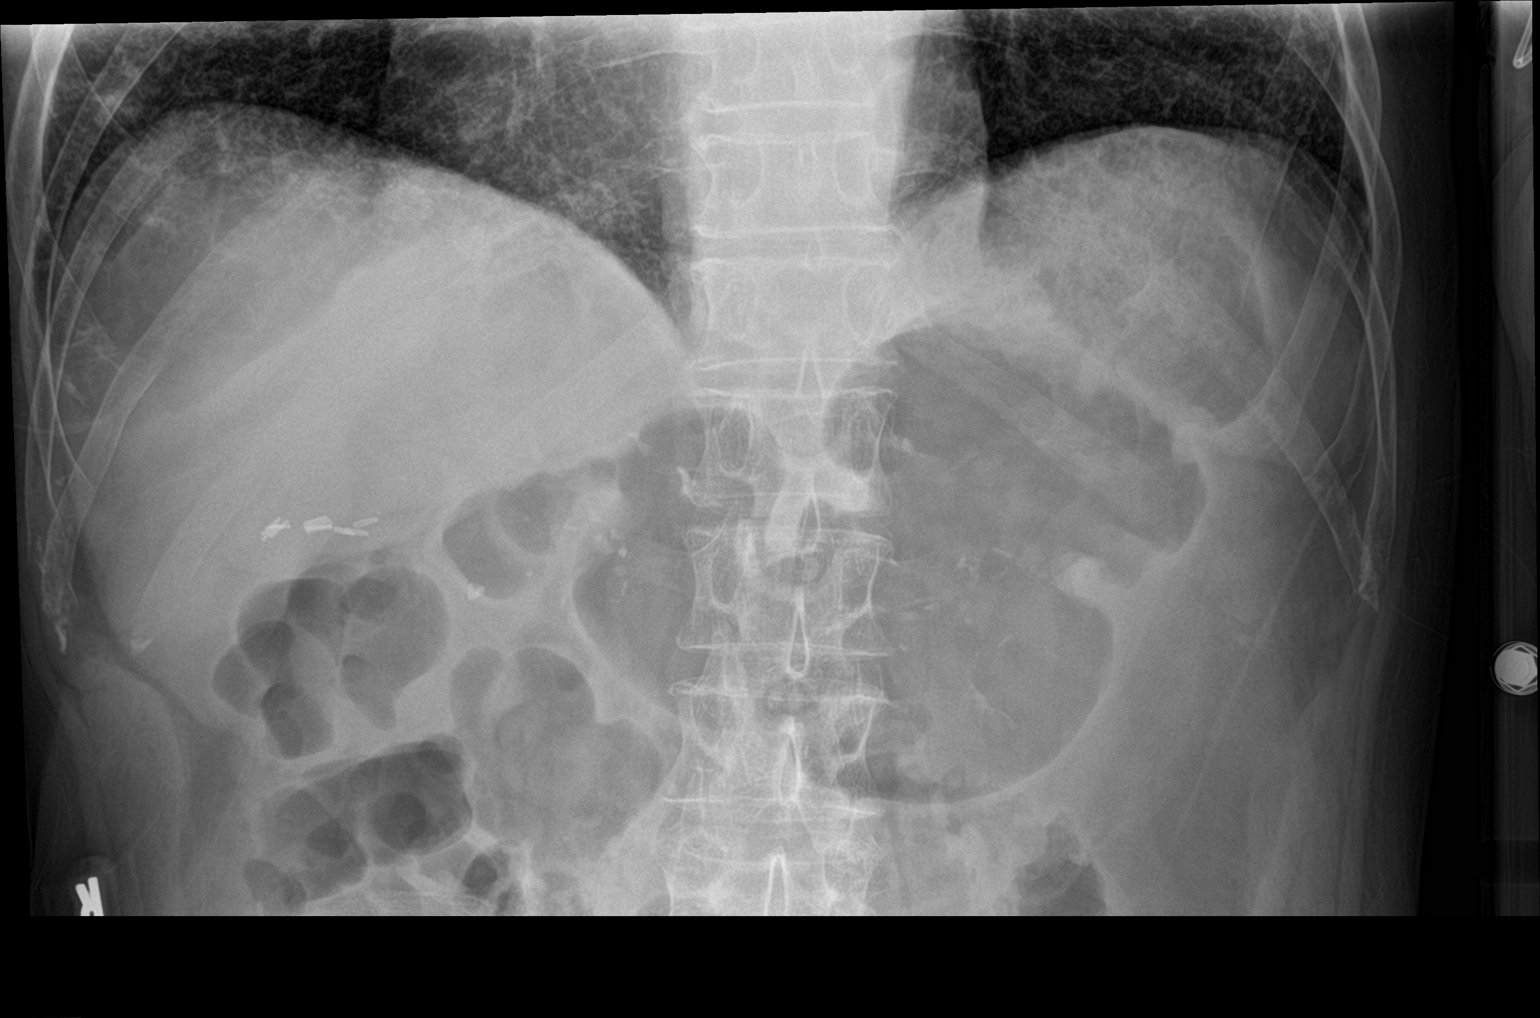

[3 of 3 positions shown; findings below may reference images not displayed]

FINDINGS: NG tube is in place in good position. No free intraperitoneal air.
Short air-fluid levels are seen in mildly distended loops of
gas-filled small bowel. Cholecystectomy clips are noted.
Calcification in the right upper quadrant correlates with a renal
stone seen on CT
IMPRESSION: Bowel gas pattern compatible small bowel obstruction.

## 2018-09-24 DIAGNOSIS — J449 Chronic obstructive pulmonary disease, unspecified: Secondary | ICD-10-CM | POA: Diagnosis not present

## 2018-09-24 DIAGNOSIS — I10 Essential (primary) hypertension: Secondary | ICD-10-CM | POA: Diagnosis not present

## 2018-09-24 DIAGNOSIS — G35 Multiple sclerosis: Secondary | ICD-10-CM | POA: Diagnosis not present

## 2018-09-24 DIAGNOSIS — N390 Urinary tract infection, site not specified: Secondary | ICD-10-CM | POA: Diagnosis not present

## 2018-09-24 DIAGNOSIS — E039 Hypothyroidism, unspecified: Secondary | ICD-10-CM | POA: Diagnosis not present

## 2018-09-27 DIAGNOSIS — I10 Essential (primary) hypertension: Secondary | ICD-10-CM | POA: Diagnosis not present

## 2018-09-27 LAB — BASIC METABOLIC PANEL
BUN: 28 — AB (ref 4–21)
CO2: 28 — AB (ref 13–22)
Chloride: 99 (ref 99–108)
Creatinine: 1.5 — AB (ref <=1.3)
Glucose: 90
Potassium: 4.8 (ref 3.4–5.3)
Sodium: 136 — AB (ref 137–147)

## 2018-09-27 LAB — LIPID PANEL
Cholesterol: 133 (ref 0–200)
HDL: 39 (ref 35–70)
LDL Cholesterol: 72
Triglycerides: 134 (ref 40–160)

## 2018-09-27 LAB — COMPREHENSIVE METABOLIC PANEL
Albumin: 4.6 (ref 3.5–5.0)
Calcium: 10 (ref 8.7–10.7)
GFR calc Af Amer: 52
GFR calc non Af Amer: 45
Globulin: 3.2

## 2018-09-27 LAB — TSH: TSH: 1.14 (ref <=5.90)

## 2018-09-27 LAB — CBC AND DIFFERENTIAL
HCT: 45 (ref 41–53)
Hemoglobin: 14.7 (ref 13.5–17.5)
Neutrophils Absolute: 4140
Platelets: 212 (ref 150–399)
WBC: 7.9

## 2018-09-27 LAB — HEPATIC FUNCTION PANEL
ALT: 12 (ref 10–40)
AST: 14 (ref 14–40)
Alkaline Phosphatase: 93 (ref 25–125)

## 2018-09-27 LAB — CBC: RBC: 4.93 (ref 3.87–5.11)

## 2018-10-01 ENCOUNTER — Ambulatory Visit (INDEPENDENT_AMBULATORY_CARE_PROVIDER_SITE_OTHER): Payer: Medicare Other | Admitting: Urology

## 2018-10-01 DIAGNOSIS — N312 Flaccid neuropathic bladder, not elsewhere classified: Secondary | ICD-10-CM | POA: Diagnosis not present

## 2018-10-16 ENCOUNTER — Other Ambulatory Visit: Payer: Self-pay | Admitting: Adult Health

## 2018-10-18 ENCOUNTER — Telehealth: Payer: Self-pay | Admitting: Neurology

## 2018-10-18 ENCOUNTER — Other Ambulatory Visit: Payer: Self-pay | Admitting: Neurology

## 2018-10-18 MED ORDER — DIAZEPAM 5 MG PO TABS: 5.0000 mg | ORAL_TABLET | Freq: Two times a day (BID) | ORAL | 3 refills | Status: DC

## 2018-10-18 NOTE — Telephone Encounter (Signed)
The diazepam prescription will be sent in, we need to make a revisit for the patient.

## 2018-10-18 NOTE — Telephone Encounter (Signed)
Patient called and requested a refill for rx Diazepam be sent to [facility].

## 2018-10-18 NOTE — Telephone Encounter (Signed)
Pt requesting refill of Diazepam 5 mg.  Drug registry verified, Last refill was written on 09/16/2018 # 41 for a 30 day supply provided by MM, NP. Pt's last o/v was 08/17/18 and no f/u scheduled.

## 2018-10-18 NOTE — Telephone Encounter (Addendum)
I contacted the pt and advised Valium has been submitted. Pt declined on making o/v at this time because he had a lot of doctors visits coming up. I advised we would need to see him in about 9 months but pt still declined scheduling. Stated he would call back to do so.

## 2018-10-22 ENCOUNTER — Ambulatory Visit (INDEPENDENT_AMBULATORY_CARE_PROVIDER_SITE_OTHER): Payer: Medicare Other | Admitting: Urology

## 2018-10-22 DIAGNOSIS — N312 Flaccid neuropathic bladder, not elsewhere classified: Secondary | ICD-10-CM

## 2018-11-04 ENCOUNTER — Encounter (INDEPENDENT_AMBULATORY_CARE_PROVIDER_SITE_OTHER): Payer: Medicare Other | Admitting: Ophthalmology

## 2018-11-04 DIAGNOSIS — H43813 Vitreous degeneration, bilateral: Secondary | ICD-10-CM | POA: Diagnosis not present

## 2018-11-04 DIAGNOSIS — H34832 Tributary (branch) retinal vein occlusion, left eye, with macular edema: Secondary | ICD-10-CM

## 2018-11-04 DIAGNOSIS — H35033 Hypertensive retinopathy, bilateral: Secondary | ICD-10-CM

## 2018-11-04 DIAGNOSIS — I10 Essential (primary) hypertension: Secondary | ICD-10-CM

## 2018-11-12 ENCOUNTER — Ambulatory Visit (INDEPENDENT_AMBULATORY_CARE_PROVIDER_SITE_OTHER): Payer: Medicare Other | Admitting: Urology

## 2018-11-12 DIAGNOSIS — N312 Flaccid neuropathic bladder, not elsewhere classified: Secondary | ICD-10-CM

## 2018-12-03 ENCOUNTER — Ambulatory Visit (INDEPENDENT_AMBULATORY_CARE_PROVIDER_SITE_OTHER): Payer: Medicare Other | Admitting: Urology

## 2018-12-03 DIAGNOSIS — N312 Flaccid neuropathic bladder, not elsewhere classified: Secondary | ICD-10-CM

## 2018-12-22 ENCOUNTER — Ambulatory Visit (INDEPENDENT_AMBULATORY_CARE_PROVIDER_SITE_OTHER): Payer: Medicare Other | Admitting: Urology

## 2018-12-22 ENCOUNTER — Other Ambulatory Visit: Payer: Self-pay

## 2018-12-22 DIAGNOSIS — N312 Flaccid neuropathic bladder, not elsewhere classified: Secondary | ICD-10-CM | POA: Diagnosis not present

## 2018-12-24 DIAGNOSIS — G35 Multiple sclerosis: Secondary | ICD-10-CM | POA: Diagnosis not present

## 2018-12-24 DIAGNOSIS — L97901 Non-pressure chronic ulcer of unspecified part of unspecified lower leg limited to breakdown of skin: Secondary | ICD-10-CM | POA: Diagnosis not present

## 2018-12-24 DIAGNOSIS — N189 Chronic kidney disease, unspecified: Secondary | ICD-10-CM | POA: Diagnosis not present

## 2019-01-04 DIAGNOSIS — L97901 Non-pressure chronic ulcer of unspecified part of unspecified lower leg limited to breakdown of skin: Secondary | ICD-10-CM | POA: Diagnosis not present

## 2019-01-04 DIAGNOSIS — I10 Essential (primary) hypertension: Secondary | ICD-10-CM | POA: Diagnosis not present

## 2019-01-04 DIAGNOSIS — R197 Diarrhea, unspecified: Secondary | ICD-10-CM | POA: Diagnosis not present

## 2019-01-12 ENCOUNTER — Ambulatory Visit (INDEPENDENT_AMBULATORY_CARE_PROVIDER_SITE_OTHER): Payer: Medicare Other | Admitting: Urology

## 2019-01-12 DIAGNOSIS — R339 Retention of urine, unspecified: Secondary | ICD-10-CM

## 2019-01-14 DIAGNOSIS — R609 Edema, unspecified: Secondary | ICD-10-CM | POA: Diagnosis not present

## 2019-01-14 DIAGNOSIS — L97901 Non-pressure chronic ulcer of unspecified part of unspecified lower leg limited to breakdown of skin: Secondary | ICD-10-CM | POA: Diagnosis not present

## 2019-01-17 DIAGNOSIS — L97901 Non-pressure chronic ulcer of unspecified part of unspecified lower leg limited to breakdown of skin: Secondary | ICD-10-CM | POA: Diagnosis not present

## 2019-01-17 DIAGNOSIS — R600 Localized edema: Secondary | ICD-10-CM | POA: Diagnosis not present

## 2019-01-27 ENCOUNTER — Encounter (INDEPENDENT_AMBULATORY_CARE_PROVIDER_SITE_OTHER): Payer: Medicare Other | Admitting: Ophthalmology

## 2019-02-02 ENCOUNTER — Ambulatory Visit (INDEPENDENT_AMBULATORY_CARE_PROVIDER_SITE_OTHER): Payer: Medicare Other | Admitting: Urology

## 2019-02-02 DIAGNOSIS — N312 Flaccid neuropathic bladder, not elsewhere classified: Secondary | ICD-10-CM | POA: Diagnosis not present

## 2019-02-03 ENCOUNTER — Telehealth: Payer: Self-pay | Admitting: Neurology

## 2019-02-03 NOTE — Telephone Encounter (Signed)
Pt has called for a refill on his diazepam (VALIUM) 5 MG tablet [employer] [phone]

## 2019-02-03 NOTE — Telephone Encounter (Signed)
I checked the [place] drug registry. Per the registry pt refilled rx last on 01/17/19 # 60 for a 30 day supply.  I contacted the pt and advised of this. Pt stated he did not need a refill at this time, but wanted refill to be put on hold at [facility]. Pt was advised I would hold refill until closer to 02/16/19. Pt was agreeable.

## 2019-02-15 ENCOUNTER — Other Ambulatory Visit: Payer: Self-pay | Admitting: Neurology

## 2019-02-17 ENCOUNTER — Encounter (INDEPENDENT_AMBULATORY_CARE_PROVIDER_SITE_OTHER): Payer: Medicare Other | Admitting: Ophthalmology

## 2019-03-01 ENCOUNTER — Encounter: Payer: Self-pay | Admitting: Family Medicine

## 2019-03-01 ENCOUNTER — Ambulatory Visit (INDEPENDENT_AMBULATORY_CARE_PROVIDER_SITE_OTHER): Payer: Medicare Other | Admitting: Urology

## 2019-03-01 DIAGNOSIS — I10 Essential (primary) hypertension: Secondary | ICD-10-CM | POA: Diagnosis not present

## 2019-03-01 DIAGNOSIS — Z960 Presence of urogenital implants: Secondary | ICD-10-CM | POA: Diagnosis not present

## 2019-03-01 DIAGNOSIS — N210 Calculus in bladder: Secondary | ICD-10-CM

## 2019-03-01 DIAGNOSIS — N390 Urinary tract infection, site not specified: Secondary | ICD-10-CM | POA: Diagnosis not present

## 2019-03-01 DIAGNOSIS — N189 Chronic kidney disease, unspecified: Secondary | ICD-10-CM | POA: Diagnosis not present

## 2019-03-01 DIAGNOSIS — L97901 Non-pressure chronic ulcer of unspecified part of unspecified lower leg limited to breakdown of skin: Secondary | ICD-10-CM | POA: Diagnosis not present

## 2019-03-01 DIAGNOSIS — N312 Flaccid neuropathic bladder, not elsewhere classified: Secondary | ICD-10-CM | POA: Diagnosis not present

## 2019-03-10 ENCOUNTER — Encounter (INDEPENDENT_AMBULATORY_CARE_PROVIDER_SITE_OTHER): Payer: Medicare Other | Admitting: Ophthalmology

## 2019-03-22 ENCOUNTER — Ambulatory Visit (INDEPENDENT_AMBULATORY_CARE_PROVIDER_SITE_OTHER): Payer: Medicare Other | Admitting: Urology

## 2019-03-22 DIAGNOSIS — R338 Other retention of urine: Secondary | ICD-10-CM

## 2019-04-04 ENCOUNTER — Encounter (INDEPENDENT_AMBULATORY_CARE_PROVIDER_SITE_OTHER): Payer: Medicare Other | Admitting: Ophthalmology

## 2019-04-15 ENCOUNTER — Other Ambulatory Visit: Payer: Self-pay

## 2019-04-15 ENCOUNTER — Ambulatory Visit (INDEPENDENT_AMBULATORY_CARE_PROVIDER_SITE_OTHER): Payer: Medicare Other | Admitting: Urology

## 2019-04-15 DIAGNOSIS — N312 Flaccid neuropathic bladder, not elsewhere classified: Secondary | ICD-10-CM

## 2019-05-03 ENCOUNTER — Encounter (INDEPENDENT_AMBULATORY_CARE_PROVIDER_SITE_OTHER): Payer: Medicare Other | Admitting: Ophthalmology

## 2019-05-06 ENCOUNTER — Ambulatory Visit (INDEPENDENT_AMBULATORY_CARE_PROVIDER_SITE_OTHER): Payer: Medicare Other | Admitting: Urology

## 2019-05-06 DIAGNOSIS — N312 Flaccid neuropathic bladder, not elsewhere classified: Secondary | ICD-10-CM

## 2019-05-27 ENCOUNTER — Ambulatory Visit (INDEPENDENT_AMBULATORY_CARE_PROVIDER_SITE_OTHER): Payer: Medicare Other | Admitting: Urology

## 2019-05-27 DIAGNOSIS — N312 Flaccid neuropathic bladder, not elsewhere classified: Secondary | ICD-10-CM

## 2019-06-02 DIAGNOSIS — E039 Hypothyroidism, unspecified: Secondary | ICD-10-CM | POA: Diagnosis not present

## 2019-06-02 DIAGNOSIS — G35 Multiple sclerosis: Secondary | ICD-10-CM | POA: Diagnosis not present

## 2019-06-02 DIAGNOSIS — I10 Essential (primary) hypertension: Secondary | ICD-10-CM | POA: Diagnosis not present

## 2019-06-02 DIAGNOSIS — M5400 Panniculitis affecting regions of neck and back, site unspecified: Secondary | ICD-10-CM | POA: Diagnosis not present

## 2019-06-07 ENCOUNTER — Telehealth: Payer: Self-pay | Admitting: Neurology

## 2019-06-07 NOTE — Telephone Encounter (Signed)
Diazepam was last refilled on 05/19/2019 # 60 for a 30 day supply. Refill is not due yet.

## 2019-06-07 NOTE — Telephone Encounter (Signed)
Pt has called for a refill on his diazepam (VALIUM) 5 MG tablet   [facility]

## 2019-06-15 NOTE — Telephone Encounter (Signed)
Prescription for diazepam will be due on 16 June 2019.

## 2019-06-15 NOTE — Telephone Encounter (Signed)
[place] drug registry has been verified. Last refill was 05/19/2019 # 60 for a 30 day supply.

## 2019-06-15 NOTE — Telephone Encounter (Signed)
Pt has asked the message be sent to RN reminding that re: his diazepam (VALIUM) 5 MG tablet he only has enough until Saturday, nothing for Sunday. Please call

## 2019-06-16 MED ORDER — DIAZEPAM 5 MG PO TABS: ORAL_TABLET | ORAL | 3 refills | Status: DC

## 2019-06-16 NOTE — Addendum Note (Signed)
Addended by: Kathrynn Ducking on: 06/16/2019 07:07 AM   Modules accepted: Orders

## 2019-06-16 NOTE — Telephone Encounter (Signed)
The diazepam prescription was filled today.

## 2019-06-17 ENCOUNTER — Telehealth: Payer: Self-pay | Admitting: Neurology

## 2019-06-17 ENCOUNTER — Other Ambulatory Visit: Payer: Self-pay | Admitting: Neurology

## 2019-06-17 ENCOUNTER — Ambulatory Visit (INDEPENDENT_AMBULATORY_CARE_PROVIDER_SITE_OTHER): Payer: Medicare Other | Admitting: Urology

## 2019-06-17 ENCOUNTER — Other Ambulatory Visit: Payer: Self-pay

## 2019-06-17 DIAGNOSIS — N312 Flaccid neuropathic bladder, not elsewhere classified: Secondary | ICD-10-CM | POA: Diagnosis not present

## 2019-06-17 MED ORDER — DIAZEPAM 5 MG PO TABS: ORAL_TABLET | ORAL | 0 refills | Status: DC

## 2019-06-17 NOTE — Telephone Encounter (Signed)
Patient called on call for valium Rx, he is taking 5mg  bid, [facility], [facility] wants to be sent in to [facility].  I called in Valium 5mg  bid 60 tabs with no refills.  Please cancel [facility] orders

## 2019-06-20 ENCOUNTER — Encounter (INDEPENDENT_AMBULATORY_CARE_PROVIDER_SITE_OTHER): Payer: Medicare Other | Admitting: Ophthalmology

## 2019-07-08 ENCOUNTER — Ambulatory Visit (INDEPENDENT_AMBULATORY_CARE_PROVIDER_SITE_OTHER): Payer: Medicare Other | Admitting: Urology

## 2019-07-08 ENCOUNTER — Other Ambulatory Visit: Payer: Self-pay

## 2019-07-08 DIAGNOSIS — N312 Flaccid neuropathic bladder, not elsewhere classified: Secondary | ICD-10-CM | POA: Diagnosis not present

## 2019-07-11 ENCOUNTER — Telehealth: Payer: Self-pay | Admitting: Neurology

## 2019-07-11 NOTE — Telephone Encounter (Signed)
[place] drug registry has been verified. Last refill was 06/17/2019 # 60 for a 30 day supply. Pt has enough refills until 07/16/2019.

## 2019-07-11 NOTE — Telephone Encounter (Signed)
The diazepam prescription will be refilled on 15 July 2019.

## 2019-07-11 NOTE — Telephone Encounter (Signed)
Pt is requesting a refill of diazepam (VALIUM) 5 MG tablet , to be sent to [place], [place] has enough to last until 07/16/2019.

## 2019-07-14 MED ORDER — DIAZEPAM 5 MG PO TABS: ORAL_TABLET | ORAL | 3 refills | Status: DC

## 2019-07-14 NOTE — Addendum Note (Signed)
Addended by: Kathrynn Ducking on: 07/14/2019 07:14 AM   Modules accepted: Orders

## 2019-07-14 NOTE — Telephone Encounter (Signed)
The diazepam prescription will be filled today.  Office is closed on 16 October.

## 2019-07-22 ENCOUNTER — Other Ambulatory Visit: Payer: Self-pay | Admitting: Urology

## 2019-07-22 ENCOUNTER — Telehealth (HOSPITAL_COMMUNITY): Payer: Self-pay | Admitting: Urology

## 2019-07-22 DIAGNOSIS — R109 Unspecified abdominal pain: Secondary | ICD-10-CM

## 2019-07-22 DIAGNOSIS — T83510D Infection and inflammatory reaction due to cystostomy catheter, subsequent encounter: Secondary | ICD-10-CM

## 2019-07-22 NOTE — Telephone Encounter (Signed)
Spoke w patient's wife/Joanne. Advised her that we will give her a call to schd as soon as we receive an updated Order/Dx  from Dr Jeffie Pollock. S/W Maudie Mercury from Dr. Ralene Muskrat office, she was unable to assist me and transferred me to [facility] VM. Detailed msg left.

## 2019-07-26 ENCOUNTER — Other Ambulatory Visit: Payer: Self-pay

## 2019-07-26 ENCOUNTER — Ambulatory Visit (HOSPITAL_COMMUNITY)
Admission: RE | Admit: 2019-07-26 | Discharge: 2019-07-26 | Disposition: A | Discharge status: Home / Self Care | Payer: Medicare Other | Source: Ambulatory Visit | Attending: Urology | Admitting: Urology

## 2019-07-26 DIAGNOSIS — R109 Unspecified abdominal pain: Secondary | ICD-10-CM | POA: Diagnosis not present

## 2019-07-26 DIAGNOSIS — Z87442 Personal history of urinary calculi: Secondary | ICD-10-CM | POA: Diagnosis not present

## 2019-07-26 DIAGNOSIS — N1330 Unspecified hydronephrosis: Secondary | ICD-10-CM | POA: Diagnosis not present

## 2019-07-26 DIAGNOSIS — T83510D Infection and inflammatory reaction due to cystostomy catheter, subsequent encounter: Secondary | ICD-10-CM | POA: Diagnosis not present

## 2019-07-26 DIAGNOSIS — N281 Cyst of kidney, acquired: Secondary | ICD-10-CM | POA: Diagnosis not present

## 2019-07-29 ENCOUNTER — Ambulatory Visit (INDEPENDENT_AMBULATORY_CARE_PROVIDER_SITE_OTHER): Payer: Medicare Other | Admitting: Urology

## 2019-07-29 ENCOUNTER — Other Ambulatory Visit: Payer: Self-pay

## 2019-07-29 DIAGNOSIS — N312 Flaccid neuropathic bladder, not elsewhere classified: Secondary | ICD-10-CM | POA: Diagnosis not present

## 2019-08-10 ENCOUNTER — Other Ambulatory Visit: Payer: Self-pay | Admitting: Urology

## 2019-08-10 DIAGNOSIS — N200 Calculus of kidney: Secondary | ICD-10-CM

## 2019-08-19 ENCOUNTER — Ambulatory Visit (INDEPENDENT_AMBULATORY_CARE_PROVIDER_SITE_OTHER): Payer: Medicare Other | Admitting: Urology

## 2019-08-19 DIAGNOSIS — N312 Flaccid neuropathic bladder, not elsewhere classified: Secondary | ICD-10-CM

## 2019-08-22 ENCOUNTER — Other Ambulatory Visit: Payer: Self-pay

## 2019-08-22 ENCOUNTER — Ambulatory Visit (INDEPENDENT_AMBULATORY_CARE_PROVIDER_SITE_OTHER): Payer: Medicare Other | Admitting: Neurology

## 2019-08-22 ENCOUNTER — Telehealth: Payer: Self-pay | Admitting: Neurology

## 2019-08-22 ENCOUNTER — Encounter: Payer: Self-pay | Admitting: Neurology

## 2019-08-22 VITALS — BP 117/90 | HR 72 | Temp 97.8°F | Wt 120.0 lb

## 2019-08-22 DIAGNOSIS — R269 Unspecified abnormalities of gait and mobility: Secondary | ICD-10-CM

## 2019-08-22 DIAGNOSIS — G441 Vascular headache, not elsewhere classified: Secondary | ICD-10-CM | POA: Diagnosis not present

## 2019-08-22 DIAGNOSIS — I679 Cerebrovascular disease, unspecified: Secondary | ICD-10-CM

## 2019-08-22 NOTE — Telephone Encounter (Signed)
Patient states he will call back to schedule his 1 year follow-up. (Return in about 1 year (around 08/21/2020).

## 2019-08-22 NOTE — Progress Notes (Signed)
Reason for visit: Gait disorder, cerebrovascular disease  Jordan Ward is an 78 y.o. male  History of present illness:  Jordan Ward is a 78 year old right-handed white male with a history of multiple sclerosis in the past, he has been on diazepam for spasticity in the legs since he was 78 years old.  The patient has not had any falls, he does have a chronic gait disorder, he has been neurologically relatively stable.  He does not use a walker inside the house, only outside of the house.  He has some issues with constipation, he has a neurogenic bladder and has a catheter in place.  The patient has occasional headaches but this is not a big issue for him.  He does not currently operate a motor vehicle.  He returns for an annual evaluation.  Since last seen, he did have cataract surgery but otherwise he has not had any new significant medical issues.  Past Medical History:  Diagnosis Date  . Anemia   . Arthritis   . Back pain, chronic   . Bilateral carotid bruits   . C. difficile colitis 09/2011  . CAD (coronary artery disease)   . Carotid artery stenosis   . Cerebrovascular disease   . Cerebrovascular disease 08/17/2018  . Colon polyps   . Dyslipidemia   . Dysphagia 10/07/2011  . Encephalopathy   . Gait disorder   . HA (headache)   . High grade dysplasia in colonic adenoma 09/2005  . History of kidney stones   . HTN (hypertension), malignant 10/06/2011  . Hypernatremia   . Hypokalemia   . Hypothyroidism 10/08/2011  . Insomnia   . Junctional rhythm   . Kidney stones   . MS (multiple sclerosis) ([place])   . Neuromuscular disorder (HCC)    MS  . OSA (obstructive sleep apnea)   . Paroxysmal atrial tachycardia ([place])   . Peripheral vascular disease ([place])   . Pneumonia 4 yrs ago  . Pulmonary fibrosis ([place]) 10/06/2011  . Pulmonary nodule 10/08/2011  . PVD (peripheral vascular disease) ([place])   . Sacral ulcer ([place])   . Sleep apnea    cannot tolerate  . Stroke [facility])    left sided weakness  .  Suprapubic catheter ([place])   . TIA (transient ischemic attack)   . Tremors of nervous system 10/08/2011  . Urinary tract infection     Past Surgical History:  Procedure Laterality Date  . APPENDECTOMY  09/2005   at time of left hemicolectomy  . BACK SURGERY  1976/1979   lower  . CATARACT EXTRACTION W/PHACO Right 03/08/2018   Procedure: CATARACT EXTRACTION PHACO AND INTRAOCULAR LENS PLACEMENT RIGHT EYE;  Surgeon: Tonny Branch, MD;  Location: [facility];  Service: Ophthalmology;  Laterality: Right;  CDE: 8.86  . CATARACT EXTRACTION W/PHACO Left 04/05/2018   Procedure: CATARACT EXTRACTION PHACO AND INTRAOCULAR LENS PLACEMENT (IOC);  Surgeon: Tonny Branch, MD;  Location: [facility];  Service: Ophthalmology;  Laterality: Left;  CDE: 7.36  . CHOLECYSTECTOMY     Dr. Tamala Julian  . COLON SURGERY  09/2005   [place]: four tubular adenomas, large adenomatous polyp with HIGH GRADE dysplasia  . COLONOSCOPY  11/2004   Dr. Sharol Roussel sessile polyp splenic flexure, 49mm sessile polyp desc colon, tubulovillous adenoma (bx not removed)  . COLONOSCOPY  01/2005   poor prep, polyp could not be found  . COLONOSCOPY  05/2005   with EMR, polypectomy Dr. Olegario Messier, bx showed high grade dysplasia, partially resected  . COLONOSCOPY  09/2005   Dr. Arsenio Loader, [place] ink tattooing, four villous colon polyp (3 had been missed on previous colonoscopies due to limitations of procedures  . COLONOSCOPY  09/2006   normal TI, no polyps  . COLONOSCOPY  10/2007   Dr. Imogene Burn distal mammillations, benign bx, normal TI, random bx neg for microscopic colitis  . CYSTOSCOPY WITH LITHOLAPAXY N/A 07/27/2018   Procedure: CYSTOSCOPY WITH LITHOLAPAXY VIA  SUPRAPUBIC TUBE;  Surgeon: Franchot Gallo, MD;  Location: [facility];  Service: Urology;  Laterality: N/A;  . CYSTOSCOPY WITH RETROGRADE PYELOGRAM, URETEROSCOPY AND STENT PLACEMENT Left 06/09/2017   Procedure: CYSTOSCOPY WITH LEFT RETROGRADE PYELOGRAM, LEFT URETEROSCOPY, LEFT URETEROSCOPIC STONE  EXTRACTION, LEFT URETERAL STENT PLACEMENT;  Surgeon: Franchot Gallo, MD;  Location: [facility];  Service: Urology;  Laterality: Left;  . INGUINAL HERNIA REPAIR  1971   bilateral  . INSERTION OF SUPRAPUBIC CATHETER  06/09/2017   Procedure: EXCHANGE OF SUPRAPUBIC CATHETER;  Surgeon: Franchot Gallo, MD;  Location: [facility];  Service: Urology;;  . IR NEPHROSTOMY PLACEMENT LEFT  05/26/2017  . KIDNEY STONE SURGERY  09/13/2015  . NEPHROLITHOTOMY Left 09/13/2015   Procedure: LEFT PERCUTANEOUS NEPHROLITHOTOMY ;  Surgeon: Franchot Gallo, MD;  Location: [facility];  Service: Urology;  Laterality: Left;  . SUPRAPUBIC CATHETER INSERTION      Family History  Problem Relation Age of Onset  . Cirrhosis Brother        etoh  . Stroke Mother 3  . Coronary artery disease Father 14  . Heart attack Brother   . Cancer Sister   . Multiple sclerosis Other   . Colon cancer Neg Hx     Social history:  reports that he quit smoking about 31 years ago. His smoking use included cigarettes. He has a 25.00 pack-year smoking history. He has never used smokeless tobacco. He reports that he does not drink alcohol or use drugs.    Allergies  Allergen Reactions  . Tetracyclines & Related Anaphylaxis and Rash  . Ciprofloxacin     Trouble swallowing unknown reaction according to wife     Medications:  Prior to Admission medications   Medication Sig Start Date End Date Taking? Authorizing Provider  carvedilol (COREG) 6.25 MG tablet Take 1 tablet (6.25 mg total) by mouth 2 (two) times daily with a meal. 07/26/14  Yes Elmahi, Mutaz, MD  diazepam (VALIUM) 5 MG tablet TAKE (1) TABLET BY MOUTH TWICE DAILY. 07/14/19  Yes Kathrynn Ducking, MD  HYDROcodone-acetaminophen (NORCO) 7.5-325 MG per tablet Take 1 tablet by mouth 2 (two) times daily. Max APAP 3 GM IN 24 HOURS FROM ALL SOURCES Patient taking differently: Take 1 tablet by mouth 4 (four) times daily as needed for moderate pain.  08/22/14  Yes Blanchie Serve, MD   ibuprofen (ADVIL,MOTRIN) 200 MG tablet Take 200 mg by mouth daily as needed for headache or moderate pain.    Yes [provider]  levothyroxine (SYNTHROID, LEVOTHROID) 88 MCG tablet Take 88 mcg by mouth daily before breakfast.   Yes [provider]  mirabegron ER (MYRBETRIQ) 25 MG TB24 tablet Take 25 mg by mouth daily as needed (bladder spasms).   Yes [provider]  potassium chloride SA (K-DUR,KLOR-CON) 20 MEQ tablet Take 20 mEq by mouth daily.   Yes Rexene Alberts, MD  trimethoprim-polymyxin b (POLYTRIM) ophthalmic solution Place 1 drop into the left eye See admin instructions. Instil 1 drop into the left eye 3 times daily on the day of the eye injection and 4 times daily for  the day after, eye injection every 2 months 12/03/17  Yes [provider]  verapamil (CALAN) 40 MG tablet Take 40 mg by mouth 2 (two) times daily.   Yes [provider]    ROS:  Out of a complete 14 system review of symptoms, the patient complains only of the following symptoms, and all other reviewed systems are negative.  Walking difficulty  Blood pressure 117/90, pulse 72, temperature 97.8 F (36.6 C), weight 120 lb (54.4 kg).  Physical Exam  General: The patient is alert and cooperative at the time of the examination.  Skin: No significant peripheral edema is noted.   Neurologic Exam  Mental status: The patient is alert and oriented x 3 at the time of the examination. The patient has apparent normal recent and remote memory, with an apparently normal attention span and concentration ability.   Cranial nerves: Facial symmetry is present. Speech is normal, no aphasia or dysarthria is noted. Extraocular movements are full. Visual fields are full.  Motor: The patient has good strength in all 4 extremities.  Sensory examination: Soft touch sensation is symmetric on the face, arms, and legs.  Coordination: The patient has good finger-nose-finger and heel-to-shin  bilaterally.  Gait and station: The patient has a slightly wide-based gait, circumduction type difficulty with the right leg.  He has good stability when using a walker.  Reflexes: Deep tendon reflexes are symmetric.   Assessment/Plan:  1.  Cerebrovascular disease versus multiple sclerosis  2.  Gait disorder  The patient will continue the diazepam, prescription was given.  He will follow-up here in 1 year, sooner if needed.  He seems to be neurologically relatively stable.  Jill Alexanders MD 08/22/2019 12:26 PM  [facility] [address] [place], [place] [address]  Phone [phone] Fax [phone]

## 2019-08-26 ENCOUNTER — Other Ambulatory Visit: Payer: Self-pay

## 2019-08-26 ENCOUNTER — Ambulatory Visit (HOSPITAL_COMMUNITY)
Admission: RE | Admit: 2019-08-26 | Discharge: 2019-08-26 | Disposition: A | Discharge status: Home / Self Care | Payer: Medicare Other | Source: Ambulatory Visit | Attending: Urology | Admitting: Urology

## 2019-08-26 DIAGNOSIS — N200 Calculus of kidney: Secondary | ICD-10-CM | POA: Insufficient documentation

## 2019-08-26 DIAGNOSIS — N132 Hydronephrosis with renal and ureteral calculous obstruction: Secondary | ICD-10-CM | POA: Diagnosis not present

## 2019-09-01 DIAGNOSIS — E039 Hypothyroidism, unspecified: Secondary | ICD-10-CM | POA: Diagnosis not present

## 2019-09-01 DIAGNOSIS — N189 Chronic kidney disease, unspecified: Secondary | ICD-10-CM | POA: Diagnosis not present

## 2019-09-01 DIAGNOSIS — I10 Essential (primary) hypertension: Secondary | ICD-10-CM | POA: Diagnosis not present

## 2019-09-16 ENCOUNTER — Ambulatory Visit (INDEPENDENT_AMBULATORY_CARE_PROVIDER_SITE_OTHER): Payer: Medicare Other

## 2019-09-16 VITALS — Temp 96.1°F

## 2019-09-16 DIAGNOSIS — R338 Other retention of urine: Secondary | ICD-10-CM

## 2019-09-16 DIAGNOSIS — N312 Flaccid neuropathic bladder, not elsewhere classified: Secondary | ICD-10-CM | POA: Insufficient documentation

## 2019-09-16 NOTE — Progress Notes (Signed)
Suprapubic Cath Change  Patient is present today for a suprapubic catheter change due to urinary retention.  82ml of water was drained from the balloon, a *24**FR foley cath was removed from the tract with out difficulty.  Site was cleaned and prepped in a sterile fashion with betadine.  A 24FR foley cath was replaced into the tract no complications were noted. Urine return was noted, 10 ml of sterile water was inflated into the balloon and a *leg** bag was attached for drainage.  Patient tolerated well. Preformed by: dmerchant,lpn  Follow up: 3wk suprapubic cath change

## 2019-10-06 DIAGNOSIS — E039 Hypothyroidism, unspecified: Secondary | ICD-10-CM | POA: Diagnosis not present

## 2019-10-06 DIAGNOSIS — G894 Chronic pain syndrome: Secondary | ICD-10-CM | POA: Diagnosis not present

## 2019-10-06 DIAGNOSIS — N189 Chronic kidney disease, unspecified: Secondary | ICD-10-CM | POA: Diagnosis not present

## 2019-10-06 DIAGNOSIS — G35 Multiple sclerosis: Secondary | ICD-10-CM | POA: Diagnosis not present

## 2019-10-06 DIAGNOSIS — E876 Hypokalemia: Secondary | ICD-10-CM | POA: Diagnosis not present

## 2019-10-06 DIAGNOSIS — Z23 Encounter for immunization: Secondary | ICD-10-CM | POA: Diagnosis not present

## 2019-10-06 DIAGNOSIS — N2889 Other specified disorders of kidney and ureter: Secondary | ICD-10-CM | POA: Diagnosis not present

## 2019-10-06 DIAGNOSIS — I10 Essential (primary) hypertension: Secondary | ICD-10-CM | POA: Diagnosis not present

## 2019-10-07 ENCOUNTER — Ambulatory Visit: Payer: Medicare Other

## 2019-10-11 ENCOUNTER — Other Ambulatory Visit: Payer: Self-pay

## 2019-10-11 ENCOUNTER — Ambulatory Visit (INDEPENDENT_AMBULATORY_CARE_PROVIDER_SITE_OTHER): Payer: Medicare Other

## 2019-10-11 DIAGNOSIS — N312 Flaccid neuropathic bladder, not elsewhere classified: Secondary | ICD-10-CM | POA: Diagnosis not present

## 2019-10-11 NOTE — Progress Notes (Signed)
Suprapubic Cath Change  Patient is present today for a suprapubic catheter change due to urinary retention.  37ml of water was drained from the balloon, a 24FR foley cath was removed from the tract with out difficulty.  Site was cleaned and prepped in a sterile fashion with betadine.  A 24FR foley cath was replaced into the tract no complications were noted. Urine return was noted, 10 ml of sterile water was inflated into the balloon and a leg bag was attached for drainage.  Patient tolerated well. A night bag was given to patient and proper instruction was given on how to switch bags.    Preformed by: d. Laycee Fitzsimmons,lpn  Follow up: n/v in 3 wks.

## 2019-10-18 ENCOUNTER — Ambulatory Visit: Payer: Medicare Other | Admitting: Family Medicine

## 2019-10-20 ENCOUNTER — Ambulatory Visit: Payer: Medicare Other | Admitting: Family Medicine

## 2019-10-25 ENCOUNTER — Ambulatory Visit: Payer: Medicare Other | Admitting: Family Medicine

## 2019-11-01 ENCOUNTER — Other Ambulatory Visit: Payer: Self-pay

## 2019-11-01 ENCOUNTER — Ambulatory Visit (INDEPENDENT_AMBULATORY_CARE_PROVIDER_SITE_OTHER): Payer: Medicare Other

## 2019-11-01 VITALS — Temp 97.8°F

## 2019-11-01 DIAGNOSIS — N312 Flaccid neuropathic bladder, not elsewhere classified: Secondary | ICD-10-CM

## 2019-11-01 NOTE — Progress Notes (Signed)
Cath Change/ Replacement  Patient is present today for a catheter change due to urinary retention.  53ml of water was removed from the balloon, a 24FR foley cath was removed with out difficulty.  Patient was cleaned and prepped in a sterile fashion with betadine. A 24 FR foley cath was replaced into the bladder no complications were noted Urine return was noted 22ml and urine was yellow in color. The balloon was filled with 61ml of sterile water. A leg bag was attached for drainage. Patient was given proper instruction on catheter care.    Performed by: Kaiyden Simkin LPN  Follow up: 3 wk cath change

## 2019-11-14 ENCOUNTER — Telehealth: Payer: Self-pay | Admitting: Neurology

## 2019-11-14 MED ORDER — DIAZEPAM 5 MG PO TABS: ORAL_TABLET | ORAL | 5 refills | Status: DC

## 2019-11-14 NOTE — Addendum Note (Signed)
Addended by: Kathrynn Ducking on: 11/14/2019 04:04 PM   Modules accepted: Orders

## 2019-11-14 NOTE — Telephone Encounter (Signed)
The diazepam was refilled.

## 2019-11-14 NOTE — Telephone Encounter (Signed)
Drug registry check last refill valium January 15 ,2021

## 2019-11-14 NOTE — Telephone Encounter (Signed)
Pt called needing a refill on his diazepam (VALIUM) 5 MG tablet sent in to the [facility] in [place]

## 2019-11-22 ENCOUNTER — Ambulatory Visit (INDEPENDENT_AMBULATORY_CARE_PROVIDER_SITE_OTHER): Payer: Medicare Other

## 2019-11-22 ENCOUNTER — Other Ambulatory Visit: Payer: Self-pay

## 2019-11-22 VITALS — Temp 97.5°F

## 2019-11-22 DIAGNOSIS — N312 Flaccid neuropathic bladder, not elsewhere classified: Secondary | ICD-10-CM | POA: Diagnosis not present

## 2019-11-22 NOTE — Progress Notes (Signed)
Cath Change/ Replacement  Patient is present today for a catheter change due to urinary retention.  13ml of water was removed from the balloon, a 24FR foley cath was removed with out difficulty.  Patient was cleaned and prepped in a sterile fashion with betadine. A 24 FR foley cath was replaced into the bladder no complications were noted Urine return was noted 54ml and urine was yellow in color. The balloon was filled with 18ml of sterile water. A leg bag was attached for drainage.  A night bag was also given to the patient and patient was given instruction on how to change from one bag to another. Patient was given proper instruction on catheter care.    Performed by: Clark Clowdus, lpn  Follow up: 3 wk cath change

## 2019-12-13 ENCOUNTER — Other Ambulatory Visit: Payer: Self-pay

## 2019-12-13 ENCOUNTER — Ambulatory Visit (INDEPENDENT_AMBULATORY_CARE_PROVIDER_SITE_OTHER): Payer: Medicare Other

## 2019-12-13 VITALS — Temp 95.5°F

## 2019-12-13 DIAGNOSIS — N312 Flaccid neuropathic bladder, not elsewhere classified: Secondary | ICD-10-CM | POA: Diagnosis not present

## 2019-12-13 NOTE — Progress Notes (Signed)
Cath Change/ Replacement  Patient is present today for a catheter change due to urinary retention.  8 ml of water was removed from the balloon, a 24FR foley cath was removed with out difficulty.  Patient was cleaned and prepped in a sterile fashion with betadine. A 24 FR foley cath was replaced into the bladder no complications were noted Urine return was noted 35ml and urine was yellow in color. The balloon was filled with 38ml of sterile water. A leg bag was attached for drainage.  Patient was given proper instruction on catheter care.    Performed by: d merchantLPN  Follow up: [place]

## 2019-12-16 DIAGNOSIS — N189 Chronic kidney disease, unspecified: Secondary | ICD-10-CM | POA: Diagnosis not present

## 2019-12-16 DIAGNOSIS — Z0000 Encounter for general adult medical examination without abnormal findings: Secondary | ICD-10-CM | POA: Diagnosis not present

## 2019-12-16 DIAGNOSIS — E039 Hypothyroidism, unspecified: Secondary | ICD-10-CM | POA: Diagnosis not present

## 2019-12-16 DIAGNOSIS — R7301 Impaired fasting glucose: Secondary | ICD-10-CM | POA: Diagnosis not present

## 2019-12-21 DIAGNOSIS — N2889 Other specified disorders of kidney and ureter: Secondary | ICD-10-CM | POA: Diagnosis not present

## 2019-12-21 DIAGNOSIS — E039 Hypothyroidism, unspecified: Secondary | ICD-10-CM | POA: Diagnosis not present

## 2019-12-21 DIAGNOSIS — G894 Chronic pain syndrome: Secondary | ICD-10-CM | POA: Diagnosis not present

## 2019-12-21 DIAGNOSIS — R945 Abnormal results of liver function studies: Secondary | ICD-10-CM | POA: Diagnosis not present

## 2019-12-21 DIAGNOSIS — M62052 Separation of muscle (nontraumatic), left thigh: Secondary | ICD-10-CM | POA: Diagnosis not present

## 2019-12-21 DIAGNOSIS — G35 Multiple sclerosis: Secondary | ICD-10-CM | POA: Diagnosis not present

## 2019-12-21 DIAGNOSIS — M79605 Pain in left leg: Secondary | ICD-10-CM | POA: Diagnosis not present

## 2019-12-21 DIAGNOSIS — N1832 Chronic kidney disease, stage 3b: Secondary | ICD-10-CM | POA: Diagnosis not present

## 2019-12-21 DIAGNOSIS — I10 Essential (primary) hypertension: Secondary | ICD-10-CM | POA: Diagnosis not present

## 2019-12-21 DIAGNOSIS — E876 Hypokalemia: Secondary | ICD-10-CM | POA: Diagnosis not present

## 2020-01-03 ENCOUNTER — Ambulatory Visit: Payer: Medicare Other

## 2020-01-03 ENCOUNTER — Other Ambulatory Visit: Payer: Self-pay

## 2020-01-03 DIAGNOSIS — N312 Flaccid neuropathic bladder, not elsewhere classified: Secondary | ICD-10-CM

## 2020-01-24 ENCOUNTER — Ambulatory Visit (INDEPENDENT_AMBULATORY_CARE_PROVIDER_SITE_OTHER): Payer: Medicare Other

## 2020-01-24 ENCOUNTER — Other Ambulatory Visit: Payer: Self-pay

## 2020-01-24 DIAGNOSIS — N312 Flaccid neuropathic bladder, not elsewhere classified: Secondary | ICD-10-CM

## 2020-01-24 NOTE — Progress Notes (Signed)
Suprapubic Cath Change  Patient is present today for a suprapubic catheter change due to urinary retention.  61ml of water was drained from the balloon, a 24FR foley cath was removed from the tract with out difficulty.  Site was cleaned and prepped in a sterile fashion with betadine.  A 24FR foley cath was replaced into the tract no complications were noted. Urine return was noted, 10 ml of sterile water was inflated into the balloon and a leg bag was attached for drainage.  Patient tolerated well.     Preformed by: Makyla Bye LPN   Follow up: 3 wk NV sp cath change

## 2020-02-24 ENCOUNTER — Other Ambulatory Visit: Payer: Self-pay

## 2020-02-24 ENCOUNTER — Ambulatory Visit (INDEPENDENT_AMBULATORY_CARE_PROVIDER_SITE_OTHER): Payer: Medicare Other

## 2020-02-24 DIAGNOSIS — N312 Flaccid neuropathic bladder, not elsewhere classified: Secondary | ICD-10-CM

## 2020-02-28 DIAGNOSIS — K450 Other specified abdominal hernia with obstruction, without gangrene: Secondary | ICD-10-CM | POA: Diagnosis not present

## 2020-02-28 NOTE — Progress Notes (Signed)
Suprapubic Cath Change  Patient is present today for a suprapubic catheter change due to urinary retention.  48ml of water was drained from the balloon, a 24FR foley cath was removed from the tract with out difficulty.  Site was cleaned and prepped in a sterile fashion with betadine.  A 24FR foley cath was replaced into the tract no complications were noted. Urine return was noted, 10 ml of sterile water was inflated into the balloon and a leg bag was attached for drainage.  Patient tolerated well. A night bag was given to patient and proper instruction was given on how to switch bags.    Preformed by: Lemario Chaikin LPN   Follow up: 3 wk cath change

## 2020-02-29 DIAGNOSIS — K450 Other specified abdominal hernia with obstruction, without gangrene: Secondary | ICD-10-CM | POA: Diagnosis not present

## 2020-03-01 ENCOUNTER — Encounter (HOSPITAL_COMMUNITY): Payer: Self-pay | Admitting: Emergency Medicine

## 2020-03-01 ENCOUNTER — Encounter: Payer: Self-pay | Admitting: General Surgery

## 2020-03-01 ENCOUNTER — Emergency Department (HOSPITAL_COMMUNITY): Payer: Medicare Other

## 2020-03-01 ENCOUNTER — Inpatient Hospital Stay (HOSPITAL_COMMUNITY)
Admission: EM | Admit: 2020-03-01 | Discharge: 2020-03-04 | DRG: 445 | Disposition: A | Discharge status: Home / Self Care | Payer: Medicare Other | Attending: Internal Medicine | Admitting: Internal Medicine

## 2020-03-01 ENCOUNTER — Other Ambulatory Visit: Payer: Self-pay

## 2020-03-01 ENCOUNTER — Ambulatory Visit (INDEPENDENT_AMBULATORY_CARE_PROVIDER_SITE_OTHER): Payer: Medicare Other | Admitting: General Surgery

## 2020-03-01 VITALS — BP 70/40 | HR 48 | Temp 96.9°F | Resp 12 | Ht 65.0 in | Wt 117.2 lb

## 2020-03-01 DIAGNOSIS — Z20822 Contact with and (suspected) exposure to covid-19: Secondary | ICD-10-CM | POA: Diagnosis present

## 2020-03-01 DIAGNOSIS — R112 Nausea with vomiting, unspecified: Secondary | ICD-10-CM | POA: Diagnosis present

## 2020-03-01 DIAGNOSIS — M549 Dorsalgia, unspecified: Secondary | ICD-10-CM | POA: Diagnosis present

## 2020-03-01 DIAGNOSIS — I129 Hypertensive chronic kidney disease with stage 1 through stage 4 chronic kidney disease, or unspecified chronic kidney disease: Secondary | ICD-10-CM | POA: Diagnosis present

## 2020-03-01 DIAGNOSIS — I959 Hypotension, unspecified: Secondary | ICD-10-CM | POA: Diagnosis present

## 2020-03-01 DIAGNOSIS — Z87440 Personal history of urinary (tract) infections: Secondary | ICD-10-CM

## 2020-03-01 DIAGNOSIS — I739 Peripheral vascular disease, unspecified: Secondary | ICD-10-CM | POA: Diagnosis present

## 2020-03-01 DIAGNOSIS — N179 Acute kidney failure, unspecified: Secondary | ICD-10-CM | POA: Diagnosis present

## 2020-03-01 DIAGNOSIS — R630 Anorexia: Secondary | ICD-10-CM | POA: Diagnosis not present

## 2020-03-01 DIAGNOSIS — E039 Hypothyroidism, unspecified: Secondary | ICD-10-CM | POA: Diagnosis present

## 2020-03-01 DIAGNOSIS — Z8249 Family history of ischemic heart disease and other diseases of the circulatory system: Secondary | ICD-10-CM

## 2020-03-01 DIAGNOSIS — Z03818 Encounter for observation for suspected exposure to other biological agents ruled out: Secondary | ICD-10-CM | POA: Diagnosis not present

## 2020-03-01 DIAGNOSIS — Z87892 Personal history of anaphylaxis: Secondary | ICD-10-CM

## 2020-03-01 DIAGNOSIS — K439 Ventral hernia without obstruction or gangrene: Secondary | ICD-10-CM

## 2020-03-01 DIAGNOSIS — N319 Neuromuscular dysfunction of bladder, unspecified: Secondary | ICD-10-CM | POA: Diagnosis present

## 2020-03-01 DIAGNOSIS — D696 Thrombocytopenia, unspecified: Secondary | ICD-10-CM | POA: Diagnosis present

## 2020-03-01 DIAGNOSIS — K56600 Partial intestinal obstruction, unspecified as to cause: Secondary | ICD-10-CM | POA: Diagnosis present

## 2020-03-01 DIAGNOSIS — Z9049 Acquired absence of other specified parts of digestive tract: Secondary | ICD-10-CM

## 2020-03-01 DIAGNOSIS — K8051 Calculus of bile duct without cholangitis or cholecystitis with obstruction: Secondary | ICD-10-CM | POA: Diagnosis not present

## 2020-03-01 DIAGNOSIS — Z79890 Hormone replacement therapy (postmenopausal): Secondary | ICD-10-CM

## 2020-03-01 DIAGNOSIS — I2510 Atherosclerotic heart disease of native coronary artery without angina pectoris: Secondary | ICD-10-CM | POA: Diagnosis present

## 2020-03-01 DIAGNOSIS — M1990 Unspecified osteoarthritis, unspecified site: Secondary | ICD-10-CM | POA: Diagnosis present

## 2020-03-01 DIAGNOSIS — N189 Chronic kidney disease, unspecified: Secondary | ICD-10-CM | POA: Diagnosis present

## 2020-03-01 DIAGNOSIS — Y846 Urinary catheterization as the cause of abnormal reaction of the patient, or of later complication, without mention of misadventure at the time of the procedure: Secondary | ICD-10-CM | POA: Diagnosis present

## 2020-03-01 DIAGNOSIS — K5710 Diverticulosis of small intestine without perforation or abscess without bleeding: Secondary | ICD-10-CM | POA: Diagnosis present

## 2020-03-01 DIAGNOSIS — G35 Multiple sclerosis: Secondary | ICD-10-CM | POA: Diagnosis present

## 2020-03-01 DIAGNOSIS — Z79899 Other long term (current) drug therapy: Secondary | ICD-10-CM

## 2020-03-01 DIAGNOSIS — N390 Urinary tract infection, site not specified: Secondary | ICD-10-CM | POA: Diagnosis present

## 2020-03-01 DIAGNOSIS — N200 Calculus of kidney: Secondary | ICD-10-CM | POA: Diagnosis not present

## 2020-03-01 DIAGNOSIS — E872 Acidosis: Secondary | ICD-10-CM | POA: Diagnosis present

## 2020-03-01 DIAGNOSIS — G4733 Obstructive sleep apnea (adult) (pediatric): Secondary | ICD-10-CM | POA: Diagnosis present

## 2020-03-01 DIAGNOSIS — Z87442 Personal history of urinary calculi: Secondary | ICD-10-CM

## 2020-03-01 DIAGNOSIS — J8410 Pulmonary fibrosis, unspecified: Secondary | ICD-10-CM | POA: Diagnosis present

## 2020-03-01 DIAGNOSIS — K8020 Calculus of gallbladder without cholecystitis without obstruction: Secondary | ICD-10-CM

## 2020-03-01 DIAGNOSIS — D649 Anemia, unspecified: Secondary | ICD-10-CM | POA: Diagnosis present

## 2020-03-01 DIAGNOSIS — Z87891 Personal history of nicotine dependence: Secondary | ICD-10-CM

## 2020-03-01 DIAGNOSIS — E785 Hyperlipidemia, unspecified: Secondary | ICD-10-CM | POA: Diagnosis present

## 2020-03-01 DIAGNOSIS — T83510A Infection and inflammatory reaction due to cystostomy catheter, initial encounter: Secondary | ICD-10-CM | POA: Diagnosis present

## 2020-03-01 DIAGNOSIS — B964 Proteus (mirabilis) (morganii) as the cause of diseases classified elsewhere: Secondary | ICD-10-CM | POA: Diagnosis present

## 2020-03-01 DIAGNOSIS — Z881 Allergy status to other antibiotic agents status: Secondary | ICD-10-CM

## 2020-03-01 DIAGNOSIS — Z820 Family history of epilepsy and other diseases of the nervous system: Secondary | ICD-10-CM

## 2020-03-01 DIAGNOSIS — Z8673 Personal history of transient ischemic attack (TIA), and cerebral infarction without residual deficits: Secondary | ICD-10-CM

## 2020-03-01 DIAGNOSIS — G8929 Other chronic pain: Secondary | ICD-10-CM | POA: Diagnosis present

## 2020-03-01 DIAGNOSIS — A084 Viral intestinal infection, unspecified: Secondary | ICD-10-CM | POA: Diagnosis present

## 2020-03-01 DIAGNOSIS — N1832 Chronic kidney disease, stage 3b: Secondary | ICD-10-CM | POA: Diagnosis present

## 2020-03-01 DIAGNOSIS — E860 Dehydration: Secondary | ICD-10-CM | POA: Diagnosis present

## 2020-03-01 DIAGNOSIS — Z8701 Personal history of pneumonia (recurrent): Secondary | ICD-10-CM

## 2020-03-01 HISTORY — DX: Ventral hernia without obstruction or gangrene: K43.9

## 2020-03-01 LAB — URINALYSIS, ROUTINE W REFLEX MICROSCOPIC
Bilirubin Urine: NEGATIVE
Glucose, UA: NEGATIVE mg/dL
Ketones, ur: NEGATIVE mg/dL
Nitrite: POSITIVE — AB
Protein, ur: 100 mg/dL — AB
Specific Gravity, Urine: 1.016 (ref 1.005–1.030)
pH: 8 (ref 5.0–8.0)

## 2020-03-01 LAB — BASIC METABOLIC PANEL
Anion gap: 8 (ref 5–15)
BUN: 49 mg/dL — ABNORMAL HIGH (ref 8–23)
CO2: 26 mmol/L (ref 22–32)
Calcium: 8.9 mg/dL (ref 8.9–10.3)
Chloride: 102 mmol/L (ref 98–111)
Creatinine, Ser: 2.48 mg/dL — ABNORMAL HIGH (ref 0.61–1.24)
GFR calc Af Amer: 28 mL/min — ABNORMAL LOW (ref >60)
GFR calc non Af Amer: 24 mL/min — ABNORMAL LOW (ref >60)
Glucose, Bld: 109 mg/dL — ABNORMAL HIGH (ref 70–99)
Potassium: 5.1 mmol/L (ref 3.5–5.1)
Sodium: 136 mmol/L (ref 135–145)

## 2020-03-01 LAB — HEPATIC FUNCTION PANEL
ALT: 195 U/L — ABNORMAL HIGH (ref 0–44)
AST: 221 U/L — ABNORMAL HIGH (ref 15–41)
Albumin: 3.8 g/dL (ref 3.5–5.0)
Alkaline Phosphatase: 226 U/L — ABNORMAL HIGH (ref 38–126)
Bilirubin, Direct: 0.6 mg/dL — ABNORMAL HIGH (ref 0.0–0.2)
Indirect Bilirubin: 1 mg/dL — ABNORMAL HIGH (ref 0.3–0.9)
Total Bilirubin: 1.6 mg/dL — ABNORMAL HIGH (ref 0.3–1.2)
Total Protein: 7.1 g/dL (ref 6.5–8.1)

## 2020-03-01 LAB — CBC WITH DIFFERENTIAL/PLATELET
Abs Immature Granulocytes: 0.01 10*3/uL (ref 0.00–0.07)
Basophils Absolute: 0 10*3/uL (ref 0.0–0.1)
Basophils Relative: 1 %
Eosinophils Absolute: 0.3 10*3/uL (ref 0.0–0.5)
Eosinophils Relative: 4 %
HCT: 40.4 % (ref 39.0–52.0)
Hemoglobin: 12.7 g/dL — ABNORMAL LOW (ref 13.0–17.0)
Immature Granulocytes: 0 %
Lymphocytes Relative: 25 %
Lymphs Abs: 2 10*3/uL (ref 0.7–4.0)
MCH: 30.9 pg (ref 26.0–34.0)
MCHC: 31.4 g/dL (ref 30.0–36.0)
MCV: 98.3 fL (ref 80.0–100.0)
Monocytes Absolute: 0.6 10*3/uL (ref 0.1–1.0)
Monocytes Relative: 8 %
Neutro Abs: 5 10*3/uL (ref 1.7–7.7)
Neutrophils Relative %: 62 %
Platelets: 128 10*3/uL — ABNORMAL LOW (ref 150–400)
RBC: 4.11 MIL/uL — ABNORMAL LOW (ref 4.22–5.81)
RDW: 13.4 % (ref 11.5–15.5)
WBC: 7.9 10*3/uL (ref 4.0–10.5)
nRBC: 0 % (ref 0.0–0.2)

## 2020-03-01 LAB — LACTIC ACID, PLASMA: Lactic Acid, Venous: 2.1 mmol/L (ref 0.5–1.9)

## 2020-03-01 MED ORDER — SODIUM CHLORIDE 0.9 % IV SOLN
1.0000 g | INTRAVENOUS | Status: DC
  Administered 2020-03-01 – 2020-03-03 (×3): 1 g via INTRAVENOUS
  Filled 2020-03-01 (×3): qty 10

## 2020-03-01 MED ORDER — MORPHINE SULFATE (PF) 2 MG/ML IV SOLN: 2.0000 mg | INTRAVENOUS | Status: DC | PRN

## 2020-03-01 MED ORDER — ONDANSETRON HCL 4 MG PO TABS: 4.0000 mg | ORAL_TABLET | Freq: Four times a day (QID) | ORAL | Status: DC | PRN

## 2020-03-01 MED ORDER — ONDANSETRON HCL 4 MG/2ML IJ SOLN: 4.0000 mg | Freq: Four times a day (QID) | INTRAMUSCULAR | Status: DC | PRN

## 2020-03-01 MED ORDER — ACETAMINOPHEN 325 MG PO TABS: 650.0000 mg | ORAL_TABLET | Freq: Four times a day (QID) | ORAL | Status: DC | PRN

## 2020-03-01 MED ORDER — HYDROCODONE-ACETAMINOPHEN 5-325 MG PO TABS
1.0000 | ORAL_TABLET | ORAL | Status: DC | PRN
  Administered 2020-03-02: 2 via ORAL
  Administered 2020-03-02: 1 via ORAL
  Administered 2020-03-02: 2 via ORAL
  Administered 2020-03-02: 1 via ORAL
  Administered 2020-03-03 – 2020-03-04 (×3): 2 via ORAL
  Filled 2020-03-01 (×3): qty 2
  Filled 2020-03-01: qty 1
  Filled 2020-03-01: qty 2
  Filled 2020-03-01: qty 1
  Filled 2020-03-01: qty 2

## 2020-03-01 MED ORDER — SODIUM CHLORIDE 0.9 % IV BOLUS
1000.0000 mL | Freq: Once | INTRAVENOUS | Status: AC
  Administered 2020-03-01: 1000 mL via INTRAVENOUS

## 2020-03-01 MED ORDER — ACETAMINOPHEN 650 MG RE SUPP: 650.0000 mg | Freq: Four times a day (QID) | RECTAL | Status: DC | PRN

## 2020-03-01 MED ORDER — SODIUM CHLORIDE 0.9 % IV SOLN: INTRAVENOUS | Status: DC

## 2020-03-01 NOTE — ED Provider Notes (Signed)
[facility] EMERGENCY DEPARTMENT Provider Note   CSN: [id number] Arrival date & time: 03/01/20  1418     History Chief Complaint  Patient presents with  . Hypotension    Jordan Ward is a 79 y.o. male.  Patient was seen by general surgery for abdominal pain today.  Dr. Constance Haw saw the patient and sent him to the emergency department for further work-up because he was hypotensive.  Patient states that he was vomiting 2 to 3 days ago and has not taken anything in my by mouth since then.  Patient has a suprapubic catheter.  The history is provided by the patient and medical records.  Abdominal Pain Pain location:  Suprapubic Pain quality: aching   Pain radiates to:  Does not radiate Pain severity:  Moderate Onset quality:  Sudden Timing:  Constant Chronicity:  New Context: not alcohol use   Relieved by:  Nothing Worsened by:  Nothing Associated symptoms: no chest pain, no cough, no diarrhea, no fatigue and no hematuria        Past Medical History:  Diagnosis Date  . Anemia   . Arthritis   . Back pain, chronic   . Bilateral carotid bruits   . C. difficile colitis 09/2011  . CAD (coronary artery disease)   . Carotid artery stenosis   . Cerebrovascular disease   . Cerebrovascular disease 08/17/2018  . Colon polyps   . Dyslipidemia   . Dysphagia 10/07/2011  . Encephalopathy   . Gait disorder   . HA (headache)   . High grade dysplasia in colonic adenoma 09/2005  . History of kidney stones   . HTN (hypertension), malignant 10/06/2011  . Hypernatremia   . Hypokalemia   . Hypothyroidism 10/08/2011  . Insomnia   . Junctional rhythm   . Kidney stones   . MS (multiple sclerosis) ([place])   . Neuromuscular disorder (HCC)    MS  . OSA (obstructive sleep apnea)   . Paroxysmal atrial tachycardia ([place])   . Peripheral vascular disease ([place])   . Pneumonia 4 yrs ago  . Pulmonary fibrosis ([place]) 10/06/2011  . Pulmonary nodule 10/08/2011  . PVD (peripheral vascular disease) ([place])   .  Sacral ulcer ([place])   . Sleep apnea    cannot tolerate  . Stroke [facility])    left sided weakness  . Suprapubic catheter ([place])   . TIA (transient ischemic attack)   . Tremors of nervous system 10/08/2011  . Urinary tract infection     Patient Active Problem List   Diagnosis Date Noted  . Ventral hernia without obstruction or gangrene 03/01/2020  . Hypotension 03/01/2020  . Poor appetite 03/01/2020  . Detrusor areflexia 09/16/2019  . Cerebrovascular disease 08/17/2018  . Partial small bowel obstruction ([place]) 07/11/2017  . Choledocholithiasis 07/11/2017  . Complicated UTI (urinary tract infection)   . Sepsis secondary to UTI ([place]) 06/14/2017  . Paroxysmal atrial tachycardia ([place]) 06/14/2017  . Hypophosphatemia 06/14/2017  . Pressure injury of skin 05/26/2017  . Hydronephrosis due to obstruction of ureter 05/26/2017  . Obstructive uropathy 05/25/2017  . CKD (chronic kidney disease), stage III ([place]) 08/23/2016  . Hydronephrosis of left kidney 08/23/2016  . Kidney stones 09/13/2015  . Essential hypertension   . Pressure ulcer 08/04/2015  . Chronic diastolic (congestive) heart failure ([place]) 08/04/2015  . Hydronephrosis with obstructing calculus 08/04/2015  . Occult blood positive stool 09/05/2014  . Rash and nonspecific skin eruption 08/25/2014  . Edema 08/19/2014  . Subacute confusional state 08/06/2014  .  Dilated cardiomyopathy ([place]) 07/21/2014  . Sepsis ([place]) 07/17/2014  . Severe sepsis ([place]) 07/17/2014  . Headache 03/13/2014  . CVA (cerebral infarction) 03/13/2014  . Abnormality of gait 03/13/2014  . Tremors of nervous system 10/08/2011  . Hypothyroidism 10/08/2011  . Pulmonary nodule 10/08/2011  . Dysphagia 10/07/2011  . HTN (hypertension), malignant 10/06/2011  . Chronic suprapubic catheter ([place]) 10/06/2011  . Pulmonary fibrosis ([place]) 10/06/2011  . UTI (urinary tract infection) 10/03/2011  . PNA (pneumonia) 10/02/2011  . Junctional rhythm 10/02/2011  . Multiple  sclerosis ([place]) 10/02/2011  . Chronic diarrhea 06/17/2011  . Hx of adenomatous colonic polyps 06/17/2011  . High grade dysplasia in colonic adenoma 09/29/2005    Past Surgical History:  Procedure Laterality Date  . APPENDECTOMY  09/2005   at time of left hemicolectomy  . BACK SURGERY  1976/1979   lower  . CATARACT EXTRACTION W/PHACO Right 03/08/2018   Procedure: CATARACT EXTRACTION PHACO AND INTRAOCULAR LENS PLACEMENT RIGHT EYE;  Surgeon: Tonny Branch, MD;  Location: [facility]S;  Service: Ophthalmology;  Laterality: Right;  CDE: 8.86  . CATARACT EXTRACTION W/PHACO Left 04/05/2018   Procedure: CATARACT EXTRACTION PHACO AND INTRAOCULAR LENS PLACEMENT (IOC);  Surgeon: Tonny Branch, MD;  Location: [facility];  Service: Ophthalmology;  Laterality: Left;  CDE: 7.36  . CHOLECYSTECTOMY     Dr. Tamala Julian  . COLON SURGERY  09/2005   [place]: four tubular adenomas, large adenomatous polyp with HIGH GRADE dysplasia  . COLONOSCOPY  11/2004   Dr. Sharol Roussel sessile polyp splenic flexure, 109mm sessile polyp desc colon, tubulovillous adenoma (bx not removed)  . COLONOSCOPY  01/2005   poor prep, polyp could not be found  . COLONOSCOPY  05/2005   with EMR, polypectomy Dr. Olegario Messier, bx showed high grade dysplasia, partially resected  . COLONOSCOPY  09/2005   Dr. Arsenio Loader, [place] ink tattooing, four villous colon polyp (3 had been missed on previous colonoscopies due to limitations of procedures  . COLONOSCOPY  09/2006   normal TI, no polyps  . COLONOSCOPY  10/2007   Dr. Imogene Burn distal mammillations, benign bx, normal TI, random bx neg for microscopic colitis  . CYSTOSCOPY WITH LITHOLAPAXY N/A 07/27/2018   Procedure: CYSTOSCOPY WITH LITHOLAPAXY VIA  SUPRAPUBIC TUBE;  Surgeon: Franchot Gallo, MD;  Location: [facility];  Service: Urology;  Laterality: N/A;  . CYSTOSCOPY WITH RETROGRADE PYELOGRAM, URETEROSCOPY AND STENT PLACEMENT Left 06/09/2017   Procedure: CYSTOSCOPY WITH LEFT RETROGRADE PYELOGRAM, LEFT  URETEROSCOPY, LEFT URETEROSCOPIC STONE EXTRACTION, LEFT URETERAL STENT PLACEMENT;  Surgeon: Franchot Gallo, MD;  Location: [facility];  Service: Urology;  Laterality: Left;  . INGUINAL HERNIA REPAIR  1971   bilateral  . INSERTION OF SUPRAPUBIC CATHETER  06/09/2017   Procedure: EXCHANGE OF SUPRAPUBIC CATHETER;  Surgeon: Franchot Gallo, MD;  Location: [facility];  Service: Urology;;  . IR NEPHROSTOMY PLACEMENT LEFT  05/26/2017  . KIDNEY STONE SURGERY  09/13/2015  . NEPHROLITHOTOMY Left 09/13/2015   Procedure: LEFT PERCUTANEOUS NEPHROLITHOTOMY ;  Surgeon: Franchot Gallo, MD;  Location: [facility];  Service: Urology;  Laterality: Left;  . SUPRAPUBIC CATHETER INSERTION         Family History  Problem Relation Age of Onset  . Cirrhosis Brother        etoh  . Stroke Mother 82  . Coronary artery disease Father 31  . Heart attack Brother   . Cancer Sister   . Multiple sclerosis Other   . Colon cancer Neg Hx     Social History   Tobacco Use  .  Smoking status: Former Smoker    Packs/day: 1.00    Years: 25.00    Pack years: 25.00    Types: Cigarettes    Quit date: 03/03/1988    Years since quitting: 32.0  . Smokeless tobacco: Never Used  Substance Use Topics  . Alcohol use: No  . Drug use: No    Home Medications Prior to Admission medications   Medication Sig Start Date End Date Taking? Authorizing Provider  carvedilol (COREG) 6.25 MG tablet Take 1 tablet (6.25 mg total) by mouth 2 (two) times daily with a meal. 07/26/14   Verlee Monte, MD  diazepam (VALIUM) 5 MG tablet TAKE (1) TABLET BY MOUTH TWICE DAILY. 11/14/19   Kathrynn Ducking, MD  HYDROcodone-acetaminophen (NORCO) 7.5-325 MG per tablet Take 1 tablet by mouth 2 (two) times daily. Max APAP 3 GM IN 24 HOURS FROM ALL SOURCES Patient taking differently: Take 1 tablet by mouth 4 (four) times daily as needed for moderate pain.  08/22/14   Blanchie Serve, MD  ibuprofen (ADVIL,MOTRIN) 200 MG tablet Take 200 mg by mouth daily as  needed for headache or moderate pain.     [provider]  levothyroxine (SYNTHROID) 75 MCG tablet Take 75 mcg by mouth daily. 12/21/19   [provider]  mirabegron ER (MYRBETRIQ) 25 MG TB24 tablet Take 25 mg by mouth daily as needed (bladder spasms).    [provider]  potassium chloride SA (K-DUR,KLOR-CON) 20 MEQ tablet Take 20 mEq by mouth daily.    Rexene Alberts, MD  trimethoprim-polymyxin b [facility]) ophthalmic solution Place 1 drop into the left eye See admin instructions. Instil 1 drop into the left eye 3 times daily on the day of the eye injection and 4 times daily for the day after, eye injection every 2 months 12/03/17   [provider]  verapamil (CALAN) 40 MG tablet Take 40 mg by mouth 2 (two) times daily.    [provider]    Allergies    Tetracyclines & related and Ciprofloxacin  Review of Systems   Review of Systems  Constitutional: Negative for appetite change and fatigue.  HENT: Negative for congestion, ear discharge and sinus pressure.   Eyes: Negative for discharge.  Respiratory: Negative for cough.   Cardiovascular: Negative for chest pain.  Gastrointestinal: Positive for abdominal pain. Negative for diarrhea.  Genitourinary: Negative for frequency and hematuria.  Musculoskeletal: Negative for back pain.  Skin: Negative for rash.  Neurological: Negative for seizures and headaches.  Psychiatric/Behavioral: Negative for hallucinations.    Physical Exam Updated Vital Signs BP 123/77 (BP Location: Left Arm)   Pulse 60   Temp 98.3 F (36.8 C) (Oral)   Resp 14   SpO2 100%   Physical Exam Vitals reviewed.  Constitutional:      Appearance: He is well-developed.  HENT:     Head: Normocephalic.     Nose: Nose normal.     Mouth/Throat:     Comments: Dry mucous membranes Eyes:     General: No scleral icterus.    Conjunctiva/sclera: Conjunctivae normal.  Neck:     Thyroid: No thyromegaly.  Cardiovascular:     Rate  and Rhythm: Normal rate and regular rhythm.     Heart sounds: No murmur. No friction rub. No gallop.   Pulmonary:     Breath sounds: No stridor. No wheezing or rales.  Chest:     Chest Swallows: No tenderness.  Abdominal:     General: There is no distension.  Tenderness: There is abdominal tenderness. There is no rebound.     Comments: Suprapubic catheter  Musculoskeletal:        General: Normal range of motion.     Cervical back: Neck supple.  Lymphadenopathy:     Cervical: No cervical adenopathy.  Skin:    Findings: No erythema or rash.  Neurological:     Mental Status: He is alert and oriented to person, place, and time.     Motor: No abnormal muscle tone.     Coordination: Coordination normal.  Psychiatric:        Behavior: Behavior normal.     ED Results / Procedures / Treatments   Labs (all labs ordered are listed, but only abnormal results are displayed) Labs Reviewed  CBC WITH DIFFERENTIAL/PLATELET - Abnormal; Notable for the following components:      Result Value   RBC 4.11 (*)    Hemoglobin 12.7 (*)    Platelets 128 (*)    All other components within normal limits  BASIC METABOLIC PANEL - Abnormal; Notable for the following components:   Glucose, Bld 109 (*)    BUN 49 (*)    Creatinine, Ser 2.48 (*)    GFR calc non Af Amer 24 (*)    GFR calc Af Amer 28 (*)    All other components within normal limits  HEPATIC FUNCTION PANEL - Abnormal; Notable for the following components:   AST 221 (*)    ALT 195 (*)    Alkaline Phosphatase 226 (*)    Total Bilirubin 1.6 (*)    Bilirubin, Direct 0.6 (*)    Indirect Bilirubin 1.0 (*)    All other components within normal limits  LACTIC ACID, PLASMA - Abnormal; Notable for the following components:   Lactic Acid, Venous 2.1 (*)    All other components within normal limits  URINE CULTURE  URINALYSIS, ROUTINE W REFLEX MICROSCOPIC    EKG None  Radiology CT ABDOMEN PELVIS WO CONTRAST  Result Date:  03/01/2020 CLINICAL DATA:  Acute abdominal pain and neutropenia EXAM: CT ABDOMEN AND PELVIS WITHOUT CONTRAST TECHNIQUE: Multidetector CT imaging of the abdomen and pelvis was performed following the standard protocol without IV contrast. COMPARISON:  08/26/2019 FINDINGS: Lower chest: Emphysematous changes with chronic scarring are identified. Hepatobiliary: Gallbladder has been surgically removed. No focal hepatic abnormality is seen. The common bile duct is significantly dilated extending to a chronic distal common bile duct stone unchanged from the prior study. Pancreas: Unremarkable. No pancreatic ductal dilatation or surrounding inflammatory changes. Spleen: Normal in size without focal abnormality. Adrenals/Urinary Tract: Adrenal glands are within normal limits. The right kidney is atrophic with scattered nonobstructing renal stones. The largest of these lies in the lower pole measuring 6.6 mm. Cystic changes are noted bilaterally. Stable nonobstructing renal calculi are noted on the left. Fullness of the left collecting system is seen. No obstructing calculus is noted. Bladder is decompressed by suprapubic catheter. There is a calcification identified to the right of the midline which appears within the bladder consistent with a previously passed calculus. This may be related to the current symptomatology. Stomach/Bowel: Postsurgical changes are noted in the colon. The appendix has been surgically removed. Fluid-filled loops of small bowel are noted. Diastasis of the rectus muscles is noted with herniation of bowel loops identified. This is stable in appearance from the prior exam. Vascular/Lymphatic: Aortic atherosclerosis. No enlarged abdominal or pelvic lymph nodes. Reproductive: Prostate is unremarkable. Other: No abdominal Cassel hernia or abnormality. No abdominopelvic ascites.  Musculoskeletal: Degenerative changes of lumbar spine are noted. IMPRESSION: Bilateral nonobstructing renal calculi as described  relatively stable from the prior exam. Calculus within a decompressed bladder consistent with previously passed stone. This was not present on the prior exam. This may have contributed to the patient's clinical symptomatology. Stable rectus diastasis with multiple bowel loops within. Stable distal common bile duct stone with significant biliary ductal dilatation. Electronically Signed   By: Inez Catalina M.D.   On: 03/01/2020 19:25    Procedures Procedures (including critical care time)  Medications Ordered in ED Medications  sodium chloride 0.9 % bolus 1,000 mL (0 mLs Intravenous Stopped 03/01/20 2002)   CRITICAL CARE Performed by: Milton Ferguson Total critical care time:62minutes Critical care time was exclusive of separately billable procedures and treating other patients. Critical care was necessary to treat or prevent imminent or life-threatening deterioration. Critical care was time spent personally by me on the following activities: development of treatment plan with patient and/or surrogate as well as nursing, discussions with consultants, evaluation of patient's response to treatment, examination of patient, obtaining history from patient or surrogate, ordering and performing treatments and interventions, ordering and review of laboratory studies, ordering and review of radiographic studies, pulse oximetry and re-evaluation of patient's condition.  ED Course  I have reviewed the triage vital signs and the nursing notes.  Pertinent labs & imaging results that were available during my care of the patient were reviewed by me and considered in my medical decision making (see chart for details).    MDM Rules/Calculators/A&P                      Patient with AKI and abdominal pain.  Patient will be admitted to medicine with surgery following the patient also       This patient presents to the ED for concern of abdominal pain and hypotensive this involves an extensive number of  treatment options, and is a complaint that carries with it a high risk of complications and morbidity.  The differential diagnosis includes bowel obstruction dehydration   Lab Tests:   I Ordered, reviewed, and interpreted labs, which included CBC and chemistries that show an AKI along with mild anemia  Medicines ordered:   I ordered medication normal saline for dehydration  Imaging Studies ordered:   I ordered imaging studies which included CT abdomen and  I independently visualized and interpreted imaging which showed no acute disease but kidney stone in the bladder and in the kidneys  Additional history obtained:   Additional history obtained from wife  Previous records obtained and reviewed   Consultations Obtained:   I consulted general surgery and hospitalist and discussed lab and imaging findings  Reevaluation:  After the interventions stated above, I reevaluated the patient and found improved  Critical Interventions:  .   Final Clinical Impression(s) / ED Diagnoses Final diagnoses:  AKI (acute kidney injury) [facility])    Rx / [place] Orders ED Discharge Orders    None       Milton Ferguson, MD 03/01/20 2045

## 2020-03-01 NOTE — ED Notes (Signed)
Joann Takaki (pt's wife) please call for any updates [phone]

## 2020-03-01 NOTE — ED Triage Notes (Signed)
Pt had a consult meeting with Dr. Constance Haw and states he is having abdominal pain with hypotension. Pt states " I might have taken two of my blood pressure pills today"

## 2020-03-01 NOTE — Patient Instructions (Signed)
Ventral Hernia  A ventral hernia is a bulge of tissue from inside the abdomen that pushes through a weak area of the muscles that form the front [place] of the abdomen. The tissues inside the abdomen are inside a sac (peritoneum). These tissues include the small intestine, large intestine, and the fatty tissue that covers the intestines (omentum). Sometimes, the bulge that forms a hernia contains intestines. Other hernias contain only fat. Ventral hernias do not go away without surgical treatment. There are several types of ventral hernias. You may have:  A hernia at an incision site from previous abdominal surgery (incisional hernia).  A hernia just above the belly button (epigastric hernia), or at the belly button (umbilical hernia). These types of hernias can develop from heavy lifting or straining.  A hernia that comes and goes (reducible hernia). It may be visible only when you lift or strain. This type of hernia can be pushed back into the abdomen (reduced).  A hernia that traps abdominal tissue inside the hernia (incarcerated hernia). This type of hernia does not reduce.  A hernia that cuts off blood flow to the tissues inside the hernia (strangulated hernia). The tissues can start to die if this happens. This is a very painful bulge that cannot be reduced. A strangulated hernia is a medical emergency. What are the causes? This condition is caused by abdominal tissue putting pressure on an area of weakness in the abdominal muscles. What increases the risk? The following factors may make you more likely to develop this condition:  Being male.  Being 60 or older.  Being overweight or obese.  Having had previous abdominal surgery, especially if there was an infection after surgery.  Having had an injury to the abdominal [place].  Having had several pregnancies.  Having a buildup of fluid inside the abdomen (ascites). What are the signs or symptoms? The only symptom of a ventral hernia  may be a painless bulge in the abdomen. A reducible hernia may be visible only when you strain, cough, or lift. Other symptoms may include:  Dull pain.  A feeling of pressure. Signs and symptoms of a strangulated hernia may include:  Increasing pain.  Nausea and vomiting.  Pain when pressing on the hernia.  The skin over the hernia turning red or purple.  Constipation.  Blood in the stool (feces). How is this diagnosed? This condition may be diagnosed based on:  Your symptoms.  Your medical history.  A physical exam. You may be asked to cough or strain while standing. These actions increase the pressure inside your abdomen and force the hernia through the opening in your muscles. Your health care provider may try to reduce the hernia by pressing on it.  Imaging studies, such as an ultrasound or CT scan. How is this treated? This condition is treated with surgery. If you have a strangulated hernia, surgery is done as soon as possible. If your hernia is small and not incarcerated, you may be asked to lose some weight before surgery. Follow these instructions at home:  Follow instructions from your health care provider about eating or drinking restrictions.  If you are overweight, your health care provider may recommend that you increase your activity level and eat a healthier diet.  Do not lift anything that is heavier than 10 lb (4.5 kg).  Return to your normal activities as told by your health care provider. Ask your health care provider what activities are safe for you. You may need to avoid activities   that increase pressure on your hernia.  Take over-the-counter and prescription medicines only as told by your health care provider.  Keep all follow-up visits as told by your health care provider. This is important. Contact a health care provider if:  Your hernia gets larger.  Your hernia becomes painful. Get help right away if:  Your hernia becomes increasingly  painful.  You have pain along with any of the following: ? Changes in skin color in the area of the hernia. ? Nausea. ? Vomiting. ? Fever. Summary  A ventral hernia is a bulge of tissue from inside the abdomen that pushes through a weak area of the muscles that form the front [place] of the abdomen.  This condition is treated with surgery, which may be urgent depending on your hernia.  Do not lift anything that is heavier than 10 lb (4.5 kg), and follow activity instructions from your health care provider. This information is not intended to replace advice given to you by your health care provider. Make sure you discuss any questions you have with your health care provider. Document Revised: 10/28/2017 Document Reviewed: 04/06/2017 Elsevier Patient Education  2020 [employer]  

## 2020-03-01 NOTE — ED Notes (Signed)
Pt returned from CT °

## 2020-03-01 NOTE — Progress Notes (Signed)
[facility] History and Physical  Reason for Referral: Ventral hernia, nausea/vomiting and abdominal pain  Referring Physician: Donneta Romberg PA (Dr. [place] Crane office)  Chief Complaint    Hernia      [place] Ward is a 79 y.o. male.  HPI: [place] Ward is a 79 yo with multiple medical issues including suprapubic catheter, history of colonic polyp with dysplasia s/p open left hemicolectomy in 2007 in [place], h/o stroke, pulmonary fibrosis and prior SBO who has been having abdominal pain and nausea/ vomiting since Monday. He has not had constipation and no BM since Saturday. He has prior CT scans in [place] past that reports diastasis but on my reviewed it looks more like a large ventral hernia 8X9.  He says he has not eaten since Tuesday because of fear of vomiting.   He went to [place] PCP and they recommended referral to me versus ED visit. [place] Ward and wife chose to see me.  He comes in today with pain and has a low BP 70/40. He thinks he may have taken his medicine twice but he is not sure. He has been confused.    Past Medical History:  Diagnosis Date  . Anemia   . Arthritis   . Back pain, chronic   . Bilateral carotid bruits   . C. difficile colitis 09/2011  . CAD (coronary artery disease)   . Carotid artery stenosis   . Cerebrovascular disease   . Cerebrovascular disease 08/17/2018  . Colon polyps   . Dyslipidemia   . Dysphagia 10/07/2011  . Encephalopathy   . Gait disorder   . HA (headache)   . High grade dysplasia in colonic adenoma 09/2005  . History of kidney stones   . HTN (hypertension), malignant 10/06/2011  . Hypernatremia   . Hypokalemia   . Hypothyroidism 10/08/2011  . Insomnia   . Junctional rhythm   . Kidney stones   . MS (multiple sclerosis) ([place])   . Neuromuscular disorder (HCC)    MS  . OSA (obstructive sleep apnea)   . Paroxysmal atrial tachycardia ([place])   . Peripheral vascular disease ([place])   . Pneumonia 4 yrs ago  . Pulmonary fibrosis ([place]) 10/06/2011  .  Pulmonary nodule 10/08/2011  . PVD (peripheral vascular disease) ([place])   . Sacral ulcer ([place])   . Sleep apnea    cannot tolerate  . Stroke [facility])    left sided weakness  . Suprapubic catheter ([place])   . TIA (transient ischemic attack)   . Tremors of nervous system 10/08/2011  . Urinary tract infection     Past Surgical History:  Procedure Laterality Date  . APPENDECTOMY  09/2005   at time of left hemicolectomy  . BACK SURGERY  1976/1979   lower  . CATARACT EXTRACTION W/PHACO Right 03/08/2018   Procedure: CATARACT EXTRACTION PHACO AND INTRAOCULAR LENS PLACEMENT RIGHT EYE;  Surgeon: Tonny Branch, MD;  Location: [facility];  Service: Ophthalmology;  Laterality: Right;  CDE: 8.86  . CATARACT EXTRACTION W/PHACO Left 04/05/2018   Procedure: CATARACT EXTRACTION PHACO AND INTRAOCULAR LENS PLACEMENT (IOC);  Surgeon: Tonny Branch, MD;  Location: [facility];  Service: Ophthalmology;  Laterality: Left;  CDE: 7.36  . CHOLECYSTECTOMY     Dr. Tamala Julian  . COLON SURGERY  09/2005   Fleishman: four tubular adenomas, large adenomatous polyp with HIGH GRADE dysplasia  . COLONOSCOPY  11/2004   Dr. Sharol Roussel sessile polyp splenic flexure, 29mm sessile polyp desc colon, tubulovillous adenoma (bx not removed)  . COLONOSCOPY  01/2005   poor prep, polyp could not be found  . COLONOSCOPY  05/2005   with EMR, polypectomy Dr. Olegario Messier, bx showed high grade dysplasia, partially resected  . COLONOSCOPY  09/2005   Dr. Arsenio Loader, [place] ink tattooing, four villous colon polyp (3 had been missed on previous colonoscopies due to limitations of procedures  . COLONOSCOPY  09/2006   normal TI, no polyps  . COLONOSCOPY  10/2007   Dr. Imogene Burn distal mammillations, benign bx, normal TI, random bx neg for microscopic colitis  . CYSTOSCOPY WITH LITHOLAPAXY N/A 07/27/2018   Procedure: CYSTOSCOPY WITH LITHOLAPAXY VIA  SUPRAPUBIC TUBE;  Surgeon: Franchot Gallo, MD;  Location: [facility];  Service: Urology;  Laterality: N/A;  . CYSTOSCOPY  WITH RETROGRADE PYELOGRAM, URETEROSCOPY AND STENT PLACEMENT Left 06/09/2017   Procedure: CYSTOSCOPY WITH LEFT RETROGRADE PYELOGRAM, LEFT URETEROSCOPY, LEFT URETEROSCOPIC STONE EXTRACTION, LEFT URETERAL STENT PLACEMENT;  Surgeon: Franchot Gallo, MD;  Location: [facility];  Service: Urology;  Laterality: Left;  . INGUINAL HERNIA REPAIR  1971   bilateral  . INSERTION OF SUPRAPUBIC CATHETER  06/09/2017   Procedure: EXCHANGE OF SUPRAPUBIC CATHETER;  Surgeon: Franchot Gallo, MD;  Location: [facility];  Service: Urology;;  . IR NEPHROSTOMY PLACEMENT LEFT  05/26/2017  . KIDNEY STONE SURGERY  09/13/2015  . NEPHROLITHOTOMY Left 09/13/2015   Procedure: LEFT PERCUTANEOUS NEPHROLITHOTOMY ;  Surgeon: Franchot Gallo, MD;  Location: [facility];  Service: Urology;  Laterality: Left;  . SUPRAPUBIC CATHETER INSERTION      Family History  Problem Relation Age of Onset  . Cirrhosis Brother        etoh  . Stroke Mother 4  . Coronary artery disease Father 30  . Heart attack Brother   . Cancer Sister   . Multiple sclerosis Other   . Colon cancer Neg Hx     Social History   Tobacco Use  . Smoking status: Former Smoker    Packs/day: 1.00    Years: 25.00    Pack years: 25.00    Types: Cigarettes    Quit date: 03/03/1988    Years since quitting: 32.0  . Smokeless tobacco: Never Used  Substance Use Topics  . Alcohol use: No  . Drug use: No    Medications: I have reviewed [place] Ward's current medications. Allergies as of 03/01/2020      Reactions   Tetracyclines & Related Anaphylaxis, Rash   Ciprofloxacin    Trouble swallowing unknown reaction according to wife       Medication List       Accurate as of March 01, 2020  1:24 PM. If you have any questions, ask your nurse or doctor.        carvedilol 6.25 MG tablet Commonly known as: COREG Take 1 tablet (6.25 mg total) by mouth 2 (two) times daily with a meal.   diazepam 5 MG tablet Commonly known as: VALIUM TAKE (1) TABLET BY MOUTH TWICE  DAILY.   HYDROcodone-acetaminophen 7.5-325 MG tablet Commonly known as: NORCO Take 1 tablet by mouth 2 (two) times daily. Max APAP 3 GM IN 24 HOURS FROM ALL SOURCES What changed:   when to take this  reasons to take this  additional instructions   ibuprofen 200 MG tablet Commonly known as: ADVIL Take 200 mg by mouth daily as needed for headache or moderate pain.   levothyroxine 75 MCG tablet Commonly known as: SYNTHROID Take 75 mcg by mouth daily. What changed: Another medication with [place] same name was removed. Continue taking  this medication, and follow [place] directions you see here. Changed by: Virl Cagey, MD   Myrbetriq 25 MG Tb24 tablet Generic drug: mirabegron ER Take 25 mg by mouth daily as needed (bladder spasms).   potassium chloride SA 20 MEQ tablet Commonly known as: KLOR-CON Take 20 mEq by mouth daily.   trimethoprim-polymyxin b ophthalmic solution Commonly known as: POLYTRIM Place 1 drop into [place] left eye See admin instructions. Instil 1 drop into [place] left eye 3 times daily on [place] day of [place] eye injection and 4 times daily for [place] day after, eye injection every 2 months   verapamil 40 MG tablet Commonly known as: CALAN Take 40 mg by mouth 2 (two) times daily.        ROS:  A comprehensive review of systems was negative except for: Respiratory: positive for wheezing Gastrointestinal: positive for abdominal pain, constipation, nausea and vomiting Endocrine: positive for tired/ sluggish  Blood pressure (!) 70/40, pulse (!) 48, temperature (!) 96.9 F (36.1 C), temperature source Temporal, resp. rate 12, height 5\' 5"  (1.651 m), weight 117 lb 3.2 oz (53.2 kg), SpO2 98 %. Physical Exam Vitals reviewed.  Constitutional:      Appearance: He is cachectic.  HENT:     Head: Normocephalic.     Nose: Nose normal.  Eyes:     Pupils: Pupils are equal, round, and reactive to light.  Cardiovascular:     Rate and Rhythm: Bradycardia present.  Pulmonary:      Effort: Pulmonary effort is normal.     Breath sounds: Normal breath sounds.  Abdominal:     General: There is no distension.     Palpations: Abdomen is soft.     Tenderness: There is abdominal tenderness.     Hernia: A hernia is present.     Comments: Large ventral hernia with rectus edges felt, at least 9X8 cm, reduces, tender on exam  Musculoskeletal:        General: No swelling.  Skin:    General: Skin is warm.  Neurological:     General: No focal deficit present.     Mental Status: He is alert and oriented to person, place, and time.  Psychiatric:        Mood and Affect: Mood normal.        Behavior: Behavior normal.     Results: CT renal protocol 07/2019- personally reviewed- defect looks more like hernia to me and confirmed on exam, no diastasis, defect 8X9 cm +   Assessment & Plan:  DONTEA CORLEW is a 79 y.o. male with a large reducible ventral hernia that is so large it is unlikely to get incarcerated and strangulated. It is too large to qualify for a mesh repair without component separation, and this would be a major surgery for someone his age and comorbidities. I do not think anyone would offer that to him.  Fixing this with mesh alone will result in a recurrence.    I am unsure why he is having pain. He has had obstructions in [place] past and has been having constipation and not eating due to nausea/vomiting. Given his BP I am worried he is dehydrated or getting septic.  I told [place] wife and Ward they must go to [place] [facility] and be evaluated with labs, CT, UA and culture (suprapubic in place and could get infections causing pain).   I have talked to Dr. Sedonia Small about [place] [facility] and [place] [facility] is now aware.   Wife is  taking him now. He is walking and does not need an ambulance at this time.   I will see in [place] hospital versus follow up as an outpatient if he does not get admitted, but I find that unlikely given BP.   All questions were answered to [place] satisfaction of [place] Ward  and family.    Virl Cagey 03/01/2020, 1:24 PM

## 2020-03-02 ENCOUNTER — Encounter (HOSPITAL_COMMUNITY): Payer: Self-pay | Admitting: Internal Medicine

## 2020-03-02 DIAGNOSIS — I739 Peripheral vascular disease, unspecified: Secondary | ICD-10-CM | POA: Diagnosis present

## 2020-03-02 DIAGNOSIS — R112 Nausea with vomiting, unspecified: Secondary | ICD-10-CM

## 2020-03-02 DIAGNOSIS — G4730 Sleep apnea, unspecified: Secondary | ICD-10-CM | POA: Diagnosis not present

## 2020-03-02 DIAGNOSIS — G35 Multiple sclerosis: Secondary | ICD-10-CM | POA: Diagnosis present

## 2020-03-02 DIAGNOSIS — D696 Thrombocytopenia, unspecified: Secondary | ICD-10-CM | POA: Diagnosis present

## 2020-03-02 DIAGNOSIS — E860 Dehydration: Secondary | ICD-10-CM | POA: Diagnosis present

## 2020-03-02 DIAGNOSIS — N179 Acute kidney failure, unspecified: Secondary | ICD-10-CM | POA: Diagnosis present

## 2020-03-02 DIAGNOSIS — I129 Hypertensive chronic kidney disease with stage 1 through stage 4 chronic kidney disease, or unspecified chronic kidney disease: Secondary | ICD-10-CM | POA: Diagnosis present

## 2020-03-02 DIAGNOSIS — E872 Acidosis: Secondary | ICD-10-CM | POA: Diagnosis present

## 2020-03-02 DIAGNOSIS — N319 Neuromuscular dysfunction of bladder, unspecified: Secondary | ICD-10-CM | POA: Diagnosis present

## 2020-03-02 DIAGNOSIS — N3001 Acute cystitis with hematuria: Secondary | ICD-10-CM

## 2020-03-02 DIAGNOSIS — D649 Anemia, unspecified: Secondary | ICD-10-CM | POA: Diagnosis present

## 2020-03-02 DIAGNOSIS — I959 Hypotension, unspecified: Secondary | ICD-10-CM | POA: Diagnosis present

## 2020-03-02 DIAGNOSIS — K439 Ventral hernia without obstruction or gangrene: Secondary | ICD-10-CM | POA: Diagnosis not present

## 2020-03-02 DIAGNOSIS — E785 Hyperlipidemia, unspecified: Secondary | ICD-10-CM | POA: Diagnosis present

## 2020-03-02 DIAGNOSIS — G4733 Obstructive sleep apnea (adult) (pediatric): Secondary | ICD-10-CM | POA: Diagnosis present

## 2020-03-02 DIAGNOSIS — K8051 Calculus of bile duct without cholangitis or cholecystitis with obstruction: Secondary | ICD-10-CM | POA: Diagnosis not present

## 2020-03-02 DIAGNOSIS — K5710 Diverticulosis of small intestine without perforation or abscess without bleeding: Secondary | ICD-10-CM | POA: Diagnosis present

## 2020-03-02 DIAGNOSIS — J8410 Pulmonary fibrosis, unspecified: Secondary | ICD-10-CM | POA: Diagnosis present

## 2020-03-02 DIAGNOSIS — Z20822 Contact with and (suspected) exposure to covid-19: Secondary | ICD-10-CM | POA: Diagnosis present

## 2020-03-02 DIAGNOSIS — I509 Heart failure, unspecified: Secondary | ICD-10-CM | POA: Diagnosis not present

## 2020-03-02 DIAGNOSIS — N1832 Chronic kidney disease, stage 3b: Secondary | ICD-10-CM | POA: Diagnosis present

## 2020-03-02 DIAGNOSIS — A084 Viral intestinal infection, unspecified: Secondary | ICD-10-CM | POA: Diagnosis present

## 2020-03-02 DIAGNOSIS — I110 Hypertensive heart disease with heart failure: Secondary | ICD-10-CM | POA: Diagnosis not present

## 2020-03-02 DIAGNOSIS — I2510 Atherosclerotic heart disease of native coronary artery without angina pectoris: Secondary | ICD-10-CM | POA: Diagnosis present

## 2020-03-02 DIAGNOSIS — N390 Urinary tract infection, site not specified: Secondary | ICD-10-CM | POA: Diagnosis present

## 2020-03-02 DIAGNOSIS — T83510A Infection and inflammatory reaction due to cystostomy catheter, initial encounter: Secondary | ICD-10-CM | POA: Diagnosis present

## 2020-03-02 DIAGNOSIS — Y846 Urinary catheterization as the cause of abnormal reaction of the patient, or of later complication, without mention of misadventure at the time of the procedure: Secondary | ICD-10-CM | POA: Diagnosis present

## 2020-03-02 DIAGNOSIS — E039 Hypothyroidism, unspecified: Secondary | ICD-10-CM | POA: Diagnosis present

## 2020-03-02 DIAGNOSIS — B964 Proteus (mirabilis) (morganii) as the cause of diseases classified elsewhere: Secondary | ICD-10-CM | POA: Diagnosis present

## 2020-03-02 HISTORY — DX: Nausea with vomiting, unspecified: R11.2

## 2020-03-02 LAB — CBC
HCT: 35.2 % — ABNORMAL LOW (ref 39.0–52.0)
Hemoglobin: 11 g/dL — ABNORMAL LOW (ref 13.0–17.0)
MCH: 31 pg (ref 26.0–34.0)
MCHC: 31.3 g/dL (ref 30.0–36.0)
MCV: 99.2 fL (ref 80.0–100.0)
Platelets: 117 10*3/uL — ABNORMAL LOW (ref 150–400)
RBC: 3.55 MIL/uL — ABNORMAL LOW (ref 4.22–5.81)
RDW: 13.2 % (ref 11.5–15.5)
WBC: 5.8 10*3/uL (ref 4.0–10.5)
nRBC: 0 % (ref 0.0–0.2)

## 2020-03-02 LAB — BASIC METABOLIC PANEL
Anion gap: 8 (ref 5–15)
BUN: 43 mg/dL — ABNORMAL HIGH (ref 8–23)
CO2: 23 mmol/L (ref 22–32)
Calcium: 7.9 mg/dL — ABNORMAL LOW (ref 8.9–10.3)
Chloride: 108 mmol/L (ref 98–111)
Creatinine, Ser: 1.94 mg/dL — ABNORMAL HIGH (ref 0.61–1.24)
GFR calc Af Amer: 38 mL/min — ABNORMAL LOW (ref >60)
GFR calc non Af Amer: 33 mL/min — ABNORMAL LOW (ref >60)
Glucose, Bld: 87 mg/dL (ref 70–99)
Potassium: 4.5 mmol/L (ref 3.5–5.1)
Sodium: 139 mmol/L (ref 135–145)

## 2020-03-02 LAB — HEPATIC FUNCTION PANEL
ALT: 122 U/L — ABNORMAL HIGH (ref 0–44)
AST: 100 U/L — ABNORMAL HIGH (ref 15–41)
Albumin: 3.1 g/dL — ABNORMAL LOW (ref 3.5–5.0)
Alkaline Phosphatase: 171 U/L — ABNORMAL HIGH (ref 38–126)
Bilirubin, Direct: 0.3 mg/dL — ABNORMAL HIGH (ref 0.0–0.2)
Indirect Bilirubin: 0.7 mg/dL (ref 0.3–0.9)
Total Bilirubin: 1 mg/dL (ref 0.3–1.2)
Total Protein: 5.8 g/dL — ABNORMAL LOW (ref 6.5–8.1)

## 2020-03-02 LAB — SARS CORONAVIRUS 2 BY RT PCR (HOSPITAL ORDER, PERFORMED IN ~~LOC~~ HOSPITAL LAB): SARS Coronavirus 2: NEGATIVE

## 2020-03-02 LAB — LACTIC ACID, PLASMA: Lactic Acid, Venous: 1.2 mmol/L (ref 0.5–1.9)

## 2020-03-02 MED ORDER — DIAZEPAM 5 MG PO TABS
5.0000 mg | ORAL_TABLET | Freq: Two times a day (BID) | ORAL | Status: DC
  Administered 2020-03-02 – 2020-03-04 (×5): 5 mg via ORAL
  Filled 2020-03-02 (×5): qty 1

## 2020-03-02 MED ORDER — CARVEDILOL 3.125 MG PO TABS
6.2500 mg | ORAL_TABLET | Freq: Two times a day (BID) | ORAL | Status: DC
  Administered 2020-03-02 – 2020-03-04 (×4): 6.25 mg via ORAL
  Filled 2020-03-02: qty 1
  Filled 2020-03-02 (×4): qty 2

## 2020-03-02 MED ORDER — ENSURE ENLIVE PO LIQD
237.0000 mL | Freq: Two times a day (BID) | ORAL | Status: DC
  Administered 2020-03-02 – 2020-03-04 (×3): 237 mL via ORAL
  Filled 2020-03-02 (×7): qty 237

## 2020-03-02 MED ORDER — MIRABEGRON ER 25 MG PO TB24
25.0000 mg | ORAL_TABLET | Freq: Every day | ORAL | Status: DC | PRN
  Filled 2020-03-02: qty 1

## 2020-03-02 MED ORDER — LEVOTHYROXINE SODIUM 75 MCG PO TABS
75.0000 ug | ORAL_TABLET | Freq: Every day | ORAL | Status: DC
  Administered 2020-03-02 – 2020-03-04 (×4): 75 ug via ORAL
  Filled 2020-03-02: qty 1
  Filled 2020-03-02: qty 2
  Filled 2020-03-02: qty 1

## 2020-03-02 MED ORDER — VERAPAMIL HCL 80 MG PO TABS
40.0000 mg | ORAL_TABLET | Freq: Two times a day (BID) | ORAL | Status: DC
  Administered 2020-03-02 – 2020-03-04 (×4): 40 mg via ORAL
  Filled 2020-03-02 (×11): qty 1

## 2020-03-02 MED ORDER — CARVEDILOL 12.5 MG PO TABS
6.2500 mg | ORAL_TABLET | Freq: Once | ORAL | Status: AC
  Administered 2020-03-02: 6.25 mg via ORAL
  Filled 2020-03-02: qty 1

## 2020-03-02 MED ORDER — DIAZEPAM 5 MG PO TABS
5.0000 mg | ORAL_TABLET | Freq: Once | ORAL | Status: AC
  Administered 2020-03-02: 5 mg via ORAL
  Filled 2020-03-02: qty 1

## 2020-03-02 NOTE — Consult Note (Addendum)
Referring Provider: [facility] Primary Care Physician:  [name], MD Primary Gastroenterologist:  Dr. Gala Romney  Date of Admission: 03/01/2020 Date of Consultation: 03/02/2020  Reason for Consultation:  Transaminitis  HPI:  Jordan Ward is a 79 y.o. male with a past medical history of multiple comorbidities including anemia, C. difficile colitis, CAD, CVA, colon polyps, dysphagia, encephalopathy, colonic adenoma with high-grade dysplasia, hypernatremia, hypokalemia, hypothyroidism, kidney stones, MS, paroxysmal atrial tachycardia, pulmonary fibrosis, PVD, large ventral hernia.  The patient was seen by primary care for abdominal pain, nausea/vomiting.  He was recommended to go to the emergency room and instead went to the surgeon's office who saw him as an outpatient and also recommended he proceed to the emergency room.  We had seen him remotely in the past (2013) for chronic diarrhea.  In the emergency department the patient describes suprapubic pain which is tender on exam, nausea/vomiting.  He appeared dehydrated as well.  Labs demonstrated urinalysis consistent with UTI, no leukocytosis, mild drift in hemoglobin to 12.7, acute on chronic kidney injury with creatinine 2.48 (apparent baseline 1.3-1.5).  Hepatic function panel showed elevated AST/ALT of 221/195, alkaline phosphatase elevated to 26, total bilirubin 1.6 (direct bilirubin 0.6 elevated/indirect bilirubin 1.0 elevated).  Lactic acidosis at 2.1.  Urine culture in process.  Blood culture with no growth for less than 12 hours.  Repeat hepatic function panel shows improvement in LFTs this morning with AST/ALT at 100/122, velocities of 171, total bilirubin normalized to 1.0, direct bilirubin remains mildly elevated at 0.3, direct bilirubin now normalized to 0.7.  SARS-CoV-2 negative.  Lactic acid improved to normal at 1.2.  CT of the abdomen and pelvis completed yesterday found absent gallbladder, no focal hepatic abnormality.  There is a  common bile duct that is significantly dilated extending to a chronic distal common bile duct stone that is unchanged from the prior study.  Pancreas unremarkable.  Fluid-filled loops of small bowel, no obvious obstruction.  Also noted bilateral nonobstructing renal calculi that are relatively stable.  Surgery has seen the patient and feels abdominal pain is likely due to UTI.  Has a true hernia though not a surgical candidate as his hernia is too large for mesh and not a surgical candidate for extensive surgery including separation of components repair.  Today he states he's doing ok. Has persistent lower abdominal pain. Denies nausea or vomiting. States she has had multiple kidney stones. Denies any other known stones that he is aware of. Denies any hematochezia or melena at home. Hernia is somewhat tender. No other overt GI complaints.  Past Medical History:  Diagnosis Date   Anemia    Arthritis    Back pain, chronic    Bilateral carotid bruits    C. difficile colitis 09/2011   CAD (coronary artery disease)    Carotid artery stenosis    Cerebrovascular disease    Cerebrovascular disease 08/17/2018   Colon polyps    Dyslipidemia    Dysphagia 10/07/2011   Encephalopathy    Gait disorder    HA (headache)    High grade dysplasia in colonic adenoma 09/2005   History of kidney stones    HTN (hypertension), malignant 10/06/2011   Hypernatremia    Hypokalemia    Hypothyroidism 10/08/2011   Insomnia    Junctional rhythm    Kidney stones    MS (multiple sclerosis) (HCC)    Neuromuscular disorder (HCC)    MS   OSA (obstructive sleep apnea)    Paroxysmal atrial tachycardia (  [place])    Peripheral vascular disease ([place])    Pneumonia 4 yrs ago   Pulmonary fibrosis ([place]) 10/06/2011   Pulmonary nodule 10/08/2011   PVD (peripheral vascular disease) ([place])    Sacral ulcer ([place])    Sleep apnea    cannot tolerate   Stroke [facility])    left sided weakness   Suprapubic catheter ([place])    TIA (transient  ischemic attack)    Tremors of nervous system 10/08/2011   Urinary tract infection    Ventral hernia without obstruction or gangrene     Large 8X9cm ventral hernia with loss of domain. CT reads report as diastasis recti with herniation or diastasis recti.  Dr. Constance Haw, Surgery, reviewed CT with radiology and there is herniation with only hernia sac or peritoneum over the bowel and large separation of the rectus muslce (i.e. diastasis recti aka loss of domain).  No surgical intervention recommended given size, age, and health.     Past Surgical History:  Procedure Laterality Date   APPENDECTOMY  09/2005   at time of left hemicolectomy   BACK SURGERY  1976/1979   lower   CATARACT EXTRACTION W/PHACO Right 03/08/2018   Procedure: CATARACT EXTRACTION PHACO AND INTRAOCULAR LENS PLACEMENT RIGHT EYE;  Surgeon: Tonny Branch, MD;  Location: [facility];  Service: Ophthalmology;  Laterality: Right;  CDE: 8.86   CATARACT EXTRACTION W/PHACO Left 04/05/2018   Procedure: CATARACT EXTRACTION PHACO AND INTRAOCULAR LENS PLACEMENT (IOC);  Surgeon: Tonny Branch, MD;  Location: [facility];  Service: Ophthalmology;  Laterality: Left;  CDE: 7.36   CHOLECYSTECTOMY     Dr. Tamala Julian   COLON SURGERY  09/2005   [place]: four tubular adenomas, large adenomatous polyp with HIGH GRADE dysplasia   COLONOSCOPY  11/2004   Dr. Sharol Roussel sessile polyp splenic flexure, 56mm sessile polyp desc colon, tubulovillous adenoma (bx not removed)   COLONOSCOPY  01/2005   poor prep, polyp could not be found   COLONOSCOPY  05/2005   with EMR, polypectomy Dr. Olegario Messier, bx showed high grade dysplasia, partially resected   COLONOSCOPY  09/2005   Dr. Arsenio Loader, [place] ink tattooing, four villous colon polyp (3 had been missed on previous colonoscopies due to limitations of procedures   COLONOSCOPY  09/2006   normal TI, no polyps   COLONOSCOPY  10/2007   Dr. Imogene Burn distal mammillations, benign bx, normal TI, random bx neg for microscopic colitis    CYSTOSCOPY WITH LITHOLAPAXY N/A 07/27/2018   Procedure: CYSTOSCOPY WITH LITHOLAPAXY VIA  SUPRAPUBIC TUBE;  Surgeon: Franchot Gallo, MD;  Location: [facility];  Service: Urology;  Laterality: N/A;   CYSTOSCOPY WITH RETROGRADE PYELOGRAM, URETEROSCOPY AND STENT PLACEMENT Left 06/09/2017   Procedure: CYSTOSCOPY WITH LEFT RETROGRADE PYELOGRAM, LEFT URETEROSCOPY, LEFT URETEROSCOPIC STONE EXTRACTION, LEFT URETERAL STENT PLACEMENT;  Surgeon: Franchot Gallo, MD;  Location: [facility];  Service: Urology;  Laterality: Left;   INGUINAL HERNIA REPAIR  1971   bilateral   INSERTION OF SUPRAPUBIC CATHETER  06/09/2017   Procedure: EXCHANGE OF SUPRAPUBIC CATHETER;  Surgeon: Franchot Gallo, MD;  Location: [facility];  Service: Urology;;   IR NEPHROSTOMY PLACEMENT LEFT  05/26/2017   KIDNEY STONE SURGERY  09/13/2015   NEPHROLITHOTOMY Left 09/13/2015   Procedure: LEFT PERCUTANEOUS NEPHROLITHOTOMY ;  Surgeon: Franchot Gallo, MD;  Location: [facility];  Service: Urology;  Laterality: Left;   SUPRAPUBIC CATHETER INSERTION      Prior to Admission medications   Medication Sig Start Date End Date Taking? Authorizing Provider  carvedilol (COREG) 6.25 MG tablet  Take 1 tablet (6.25 mg total) by mouth 2 (two) times daily with a meal. Patient taking differently: Take 6.25 mg by mouth 2 (two) times daily with a meal. ALL MEDICATIONS TO BE CRUSHED AND PLACED IN APPLESAUCE 07/26/14  Yes Elmahi, Mutaz, MD  diazepam (VALIUM) 5 MG tablet TAKE (1) TABLET BY MOUTH TWICE DAILY. Patient taking differently: Take 5 mg by mouth in the morning and at bedtime. ALL MEDICATIONS TO BE CRUSHED AND PLACED IN APPLESAUCE 11/14/19  Yes Kathrynn Ducking, MD  Glycerin-Hypromellose-PEG 400 (DRY EYE RELIEF DROPS) 0.2-0.2-1 % SOLN Apply 1-2 drops to eye daily as needed (for dry eyes).   Yes [provider]  HYDROcodone-acetaminophen (NORCO) 7.5-325 MG per tablet Take 1 tablet by mouth 2 (two) times daily. Max APAP 3 GM IN 24 HOURS FROM ALL  SOURCES Patient taking differently: Take 1 tablet by mouth in the morning and at bedtime. ALL MEDICATIONS TO BE CRUSHED AND PLACED IN APPLESAUCE 08/22/14  Yes Pandey, Mahima, MD  levothyroxine (SYNTHROID) 75 MCG tablet Take 75 mcg by mouth daily before breakfast. ALL MEDICATIONS TO BE CRUSHED AND PLACED IN APPLESAUCE 12/21/19  Yes [provider]  mirabegron ER (MYRBETRIQ) 25 MG TB24 tablet Take 25 mg by mouth daily as needed (bladder spasms).   Yes [provider]  potassium chloride SA (K-DUR,KLOR-CON) 20 MEQ tablet Take 20 mEq by mouth every morning. ALL MEDICATIONS TO BE CRUSHED AND PLACED IN APPLESAUCE   Yes Rexene Alberts, MD  verapamil (CALAN) 40 MG tablet Take 40 mg by mouth 2 (two) times daily. ALL MEDICATIONS TO BE CRUSHED AND PLACED IN APPLESAUCE   Yes [provider]    Current Facility-Administered Medications  Medication Dose Route Frequency Provider Last Rate Last Admin   0.9 %  sodium chloride infusion   Intravenous Continuous Bunnie Pion Z, DO   Stopped at 03/02/20 1007   acetaminophen (TYLENOL) tablet 650 mg  650 mg Oral Q6H PRN Bunnie Pion Z, DO       Or   acetaminophen (TYLENOL) suppository 650 mg  650 mg Rectal Q6H PRN Bunnie Pion Z, DO       carvedilol (COREG) tablet 6.25 mg  6.25 mg Oral BID WC Bunnie Pion Z, DO   6.25 mg at 03/02/20 0816   cefTRIAXone (ROCEPHIN) 1 g in sodium chloride 0.9 % 100 mL IVPB  1 g Intravenous Q24H Bunnie Pion Z, DO   Stopped at 03/01/20 2310   diazepam (VALIUM) tablet 5 mg  5 mg Oral BID Edwin Dada, MD   5 mg at 03/02/20 1338   feeding supplement (ENSURE ENLIVE) (ENSURE ENLIVE) liquid 237 mL  237 mL Oral BID BM Danford, Suann Jabier, MD       HYDROcodone-acetaminophen (NORCO/VICODIN) 5-325 MG per tablet 1-2 tablet  1-2 tablet Oral Q4H PRN Bunnie Pion Z, DO   1 tablet at 03/02/20 1338   levothyroxine (SYNTHROID) tablet 75 mcg  75 mcg Oral Q0600 Bunnie Pion Z, DO   75 mcg at 03/02/20 8921    mirabegron ER (MYRBETRIQ) tablet 25 mg  25 mg Oral Daily PRN Bunnie Pion Z, DO       morphine 2 MG/ML injection 2 mg  2 mg Intravenous Q4H PRN Bunnie Pion Z, DO       ondansetron [facility]) tablet 4 mg  4 mg Oral Q6H PRN Bunnie Pion Z, DO       Or   ondansetron [facility]) injection 4 mg  4 mg Intravenous Q6H PRN  Humphrey Rolls, Mohammad Z, DO       verapamil (CALAN) tablet 40 mg  40 mg Oral BID Bunnie Pion Z, DO   40 mg at 03/02/20 1338    Allergies as of 03/01/2020 - Review Complete 03/01/2020  Allergen Reaction Noted   Tetracyclines & related Anaphylaxis and Rash 06/17/2011   Ciprofloxacin  07/17/2014    Family History  Problem Relation Age of Onset   Cirrhosis Brother        etoh   Stroke Mother 91   Coronary artery disease Father 22   Heart attack Brother    Cancer Sister    Multiple sclerosis Other    Colon cancer Neg Hx     Social History   Socioeconomic History   Marital status: Married    Spouse name: Pricilla Holm   Number of children: 2   Years of education: 10   Highest education level: Not on file  Occupational History   Occupation: disability    Employer: RETIRED  Tobacco Use   Smoking status: Former Smoker    Packs/day: 1.00    Years: 25.00    Pack years: 25.00    Types: Cigarettes    Quit date: 03/03/1988    Years since quitting: 32.0   Smokeless tobacco: Never Used  Substance and Sexual Activity   Alcohol use: No   Drug use: No   Sexual activity: Never  Other Topics Concern   Not on file  Social History Narrative   Patient is married Pricilla Holm) and  Lives w/ wife.   Patient is retired.   Patient has a 10th grade education.   Patient has two children.      Patient drinks about 1-2 sodas daily.   Patient is left handed.    Social Determinants of Health   Financial Resource Strain:    Difficulty of Paying Living Expenses:   Food Insecurity:    Worried About Charity fundraiser in the Last Year:    Arboriculturist in the Last Year:    Transportation Needs:    Film/video editor (Medical):    Lack of Transportation (Non-Medical):   Physical Activity:    Days of Exercise per Week:    Minutes of Exercise per Session:   Stress:    Feeling of Stress :   Social Connections:    Frequency of Communication with Friends and Family:    Frequency of Social Gatherings with Friends and Family:    Attends Religious Services:    Active Member of Clubs or Organizations:    Attends Music therapist:    Marital Status:   Intimate Partner Violence:    Fear of Current or Ex-Partner:    Emotionally Abused:    Physically Abused:    Sexually Abused:     Review of Systems: General: Negative for anorexia, weight loss, fever, chills, fatigue, weakness. ENT: Negative for hoarseness, difficulty swallowing. CV: Negative for chest pain, angina, palpitations, peripheral edema.  Respiratory: Negative for dyspnea at rest, cough, sputum, wheezing.  GI: See history of present illness. Derm: Negative for rash or itching.  Neuro: Negative for weakness, abnormal sensation, seizure, frequent headaches, memory loss, confusion.  Psych: Negative for anxiety, depression, suicidal ideation, hallucinations.  Endo: Negative for unusual weight change.  Allergy: Negative for rash or hives.  Physical Exam: Vital signs in last 24 hours: Temp:  [98.3 F (36.8 C)] 98.3 F (36.8 C) (06/03 1443) Pulse Rate:  [50-64] 64 (06/04 1330) Resp:  [  14-16] 15 (06/04 0630) BP: (97-160)/(58-116) 145/116 (06/04 1338) SpO2:  [96 %-100 %] 96 % (06/04 1330)   General:   Alert,  Well-developed, well-nourished, pleasant and cooperative in NAD Head:  Normocephalic and atraumatic. Eyes:  Sclera clear, no icterus. Conjunctiva pink. Ears:  Normal auditory acuity. Neck:  Supple; no masses or thyromegaly. Lungs:  Clear throughout to auscultation. No wheezes, crackles, or rhonchi. No acute distress. Heart:  Regular rate and rhythm; no murmurs, clicks, rubs,   or gallops. Abdomen:  Soft, nontender and nondistended. No masses, hepatosplenomegaly or hernias noted. Normal bowel sounds, without guarding, and without rebound.   Rectal:  Deferred.   Msk:  Symmetrical without gross deformities. Pulses:  Normal bilateral DP pulses noted. Extremities:  Without clubbing or edema. Neurologic:  Alert and  oriented x4;  grossly normal neurologically. Psych:  Alert and cooperative. Normal mood and affect.  Intake/Output from previous day: 06/03 0701 - 06/04 0700 In: 2100 [IV Piggyback:2100] Out: 200 [Urine:200] Intake/Output this shift: Total I/O In: 1095 [I.V.:1095] Out: -   Lab Results: Recent Labs    03/01/20 1730 03/02/20 0338  WBC 7.9 5.8  HGB 12.7* 11.0*  HCT 40.4 35.2*  PLT 128* 117*   BMET Recent Labs    03/01/20 1730 03/02/20 0338  NA 136 139  K 5.1 4.5  CL 102 108  CO2 26 23  GLUCOSE 109* 87  BUN 49* 43*  CREATININE 2.48* 1.94*  CALCIUM 8.9 7.9*   LFT Recent Labs    03/01/20 1730 03/02/20 0338  PROT 7.1 5.8*  ALBUMIN 3.8 3.1*  AST 221* 100*  ALT 195* 122*  ALKPHOS 226* 171*  BILITOT 1.6* 1.0  BILIDIR 0.6* 0.3*  IBILI 1.0* 0.7   PT/INR No results for input(s): LABPROT, INR in the last 72 hours. Hepatitis Panel No results for input(s): HEPBSAG, HCVAB, HEPAIGM, HEPBIGM in the last 72 hours. C-Diff No results for input(s): CDIFFTOX in the last 72 hours.  Studies/Results: CT ABDOMEN PELVIS WO CONTRAST  Result Date: 03/01/2020 CLINICAL DATA:  Acute abdominal pain and neutropenia EXAM: CT ABDOMEN AND PELVIS WITHOUT CONTRAST TECHNIQUE: Multidetector CT imaging of the abdomen and pelvis was performed following the standard protocol without IV contrast. COMPARISON:  08/26/2019 FINDINGS: Lower chest: Emphysematous changes with chronic scarring are identified. Hepatobiliary: Gallbladder has been surgically removed. No focal hepatic abnormality is seen. The common bile duct is significantly dilated extending to a chronic  distal common bile duct stone unchanged from the prior study. Pancreas: Unremarkable. No pancreatic ductal dilatation or surrounding inflammatory changes. Spleen: Normal in size without focal abnormality. Adrenals/Urinary Tract: Adrenal glands are within normal limits. The right kidney is atrophic with scattered nonobstructing renal stones. The largest of these lies in the lower pole measuring 6.6 mm. Cystic changes are noted bilaterally. Stable nonobstructing renal calculi are noted on the left. Fullness of the left collecting system is seen. No obstructing calculus is noted. Bladder is decompressed by suprapubic catheter. There is a calcification identified to the right of the midline which appears within the bladder consistent with a previously passed calculus. This may be related to the current symptomatology. Stomach/Bowel: Postsurgical changes are noted in the colon. The appendix has been surgically removed. Fluid-filled loops of small bowel are noted. Diastasis of the rectus muscles is noted with herniation of bowel loops identified. This is stable in appearance from the prior exam. Vascular/Lymphatic: Aortic atherosclerosis. No enlarged abdominal or pelvic lymph nodes. Reproductive: Prostate is unremarkable. Other: No abdominal Sanden hernia or abnormality.  No abdominopelvic ascites. Musculoskeletal: Degenerative changes of lumbar spine are noted. IMPRESSION: Bilateral nonobstructing renal calculi as described relatively stable from the prior exam. Calculus within a decompressed bladder consistent with previously passed stone. This was not present on the prior exam. This may have contributed to the patient's clinical symptomatology. Stable rectus diastasis with multiple bowel loops within. Stable distal common bile duct stone with significant biliary ductal dilatation. Electronically Signed   By: Inez Catalina M.D.   On: 03/01/2020 19:25    Impression: Pleasant 79 year old male who presents on  recommendation from surgeon and PCP for abdominal pain, nausea, vomiting. History of multiple kidney stones, suprapubic catheter insertion. Known ventral hernia deemed "true hernia" by surgeon which is too large for mesh repair and the patient is not a candidate for more extensive segmental repair as per above. The patient was evaluated in the emergency department and it was determined he has a urinary tract infection. Lactic acid was elevated initially. No white blood cell count. He is denied any right upper quadrant pain. Denies any knowledge of stones other than kidney stones. Other than his abdominal pain he is generally asymptomatic from GI standpoint at this time.  Elevated LFTs - I reviewed his records extensively with Dr. Gala Romney and it appears the patient has had a chronic distal CBD stone dating back at least to 2014. Persistent significant CBD dilation, intra and extrahepatic ductal dilation as well. He is status post cholecystectomy. There is no evidence of attempted ERCP. No signs of acute cholangitis at this time. His LFTs have improved since yesterday but still remain with elevated transaminases and alkaline phosphatase, total bilirubin now normal. I discussed the case with Dr. Melony Overly who will plan to do an ERCP for choledocholithiasis in the morning.  Abdominal pain-overall I feel his abdominal pain is likely due to his chronic urinary issues and active urinary tract infection. The patient states he was told by the surgeon that he would need to have his catheter changed out. Still pending hospitalist evaluation. Further treatment per hospitalist.  Plan: Treatment of UTI per hospitalist Plan ERCP in the morning with general anesthesia Check INR, CBC, BMET, HFP in the morning Clear liquids today N.p.o. after midnight Monitor for any obvious GI bleed Recheck LFTs in the morning Supportive measures   Thank you for allowing [place] to participate in the care of [place], DNP,  AGNP-C Adult & Gerontological Nurse Practitioner [facility]    LOS: 0 days     03/02/2020, 2:09 PM  Attending note:   History and imaging studies reviewed.  Patient has chronic choledocholithiasis.  He is at risk for biliary pancreatitis and cholangitis.  He would benefit from an ERCP with stone extraction..  Case has been discussed with Dr. Otelia Limes who will see in the morning.

## 2020-03-02 NOTE — Progress Notes (Signed)
PROGRESS NOTE    Jordan Ward  YQI:[id number] DOB: 04/12/1941 DOA: 03/01/2020 PCP: [name], MD      Brief Narrative:  Jordan Ward is a 79 y.o. M with multiple sclerosis with neurogenic bladder, SP catheter in place, history of colectomy for adenoma, large ventral hernia, HTN, hypothyroidism, CKD stage III history of C. difficile, history of stroke, pulmonary fibrosis, sleep apnea who presented with vomiting for 1 days.  Patient was in his usual state of health until around 3 days prior to admission started to have abdominal cramps, nausea and vomiting.  Also developed constipation without BM.  He went to see his general surgeon, who noticed he had blood pressure 70/40 and sent him to the ER.       Assessment & Plan:  Vomiting --> probably enteritis causing partial SBO Reviewed images with Radiology, there is a small segment of inflamed small intestine with fecalization and mesenteric stranding near his ventral hernia.  I agree with general surgery, there is no clinical suspicion for strangulation, bowel necrosis, or frank small bowel obstruction, suspect this is all from a viral gastroenteritis.  -IV fluids -Anti-emetics -bowel rest    Hypotension transient Hypertension Cerebrovascular disease, secondary prevention Patient with BP 70/40 at Gen Surg office.  "May have taken double his antihypertensive doses...can't remember".  In ER BP normal and now hypertensive. -Resume carvedilol and verapamil   AKI on CKD stage III Cr 2.48 on admission doubled from baseline 1.2-1.5.  Improved with fluids.  Likely due to hypotension. -Continue Iv fluids -Trend Cr  Transaminitis Probably due to hypotension.  Already resolving.  Nothing on exam to suggest cholangitis.  Reviewed images with Radiology, the CBD stone is chronic, the dilation is chronic, nothing to suggest impacted CBD stone or obstrcution.   -Trend LFTs -Consult GI, appreciate cares  Multiple sclerosis -Continue Valium  and mirabegron  Possible UTI associated with chronic indwelling catheter, present on admission Based on history of abdominal pain and vomitnig with pyuria. -Continue ceftriaxone -Transition to orals at discharge and minimize antibiotic course -Follow urine culture  Hypothyroidism -Continue levothyroxine  Sleep apnea -CPAP at night  Anemia Thrombocytopenia Mild, no evidence of bleeding -Follow with PCP       Disposition: Status is: Observation  The patient will require care spanning > 2 midnights and should be moved to inpatient because: The patient is still not able to demonstrate adequate oral intake by mouth, still on IV fluids and IV morphine  Dispo: The patient is from: Home              Anticipated d/c is to: Home              Anticipated d/c date is: 1 day              Patient currently is not medically stable to d/c.              MDM: This is a no charge note.  For further details, please see H&P by my partner Jordan Ward from earlier today.  The below labs and imaging reports were reviewed and summarized above.    DVT prophylaxis: SCDs Code Status: FULL Family Communication:     Consultants:   GI  Gen Surg  Procedures:   CT abdomen  Antimicrobials:   Ceftriaxone 6/3 >>   Culture data:   6/3 blood culture >> ngtd  6/3 urine culture >> ngtd           Subjective: The  patient still has tenderness to palpation.  His abdominal pain is unchanged.  No fever.        Objective: Vitals:   03/02/20 0630 03/02/20 1300 03/02/20 1330 03/02/20 1338  BP: 126/66 (!) 160/85 (!) 145/116 (!) 145/116  Pulse: (!) 55 61 64   Resp: 15     Temp:      TempSrc:      SpO2: 100% 98% 96%     Intake/Output Summary (Last 24 hours) at 03/02/2020 1358 Last data filed at 03/02/2020 1007 Gross per 24 hour  Intake 3195 ml  Output 200 ml  Net 2995 ml   There were no vitals filed for this visit.  Examination: The patient was seen and examined.       Data Reviewed: I have personally reviewed following labs and imaging studies:  CBC: Recent Labs  Lab 03/01/20 1730 03/02/20 0338  WBC 7.9 5.8  NEUTROABS 5.0  --   HGB 12.7* 11.0*  HCT 40.4 35.2*  MCV 98.3 99.2  PLT 128* 166*   Basic Metabolic Panel: Recent Labs  Lab 03/01/20 1730 03/02/20 0338  NA 136 139  K 5.1 4.5  CL 102 108  CO2 26 23  GLUCOSE 109* 87  BUN 49* 43*  CREATININE 2.48* 1.94*  CALCIUM 8.9 7.9*   GFR: Estimated Creatinine Clearance: 24.4 mL/min (A) (by C-G formula based on SCr of 1.94 mg/dL (H)). Liver Function Tests: Recent Labs  Lab 03/01/20 1730 03/02/20 0338  AST 221* 100*  ALT 195* 122*  ALKPHOS 226* 171*  BILITOT 1.6* 1.0  PROT 7.1 5.8*  ALBUMIN 3.8 3.1*   No results for input(s): LIPASE, AMYLASE in the last 168 hours. No results for input(s): AMMONIA in the last 168 hours. Coagulation Profile: No results for input(s): INR, PROTIME in the last 168 hours. Cardiac Enzymes: No results for input(s): CKTOTAL, CKMB, CKMBINDEX, TROPONINI in the last 168 hours. BNP (last 3 results) No results for input(s): PROBNP in the last 8760 hours. HbA1C: No results for input(s): HGBA1C in the last 72 hours. CBG: No results for input(s): GLUCAP in the last 168 hours. Lipid Profile: No results for input(s): CHOL, HDL, LDLCALC, TRIG, CHOLHDL, LDLDIRECT in the last 72 hours. Thyroid Function Tests: No results for input(s): TSH, T4TOTAL, FREET4, T3FREE, THYROIDAB in the last 72 hours. Anemia Panel: No results for input(s): VITAMINB12, FOLATE, FERRITIN, TIBC, IRON, RETICCTPCT in the last 72 hours. Urine analysis:    Component Value Date/Time   COLORURINE AMBER (A) 03/01/2020 1801   APPEARANCEUR CLOUDY (A) 03/01/2020 1801   LABSPEC 1.016 03/01/2020 1801   PHURINE 8.0 03/01/2020 1801   GLUCOSEU NEGATIVE 03/01/2020 1801   HGBUR MODERATE (A) 03/01/2020 1801   BILIRUBINUR NEGATIVE 03/01/2020 1801   KETONESUR NEGATIVE 03/01/2020 1801   PROTEINUR  100 (A) 03/01/2020 1801   UROBILINOGEN 0.2 07/17/2014 1215   NITRITE POSITIVE (A) 03/01/2020 1801   LEUKOCYTESUR MODERATE (A) 03/01/2020 1801   Sepsis Labs: @LABRCNTIP (procalcitonin:4,lacticacidven:4)  ) Recent Results (from the past 240 hour(s))  Culture, blood (routine x 2)     Status: None (Preliminary result)   Collection Time: 03/01/20  9:54 PM   Specimen: BLOOD LEFT FOREARM  Result Value Ref Range Status   Specimen Description BLOOD LEFT FOREARM  Final   Special Requests   Final    BOTTLES DRAWN AEROBIC AND ANAEROBIC Blood Culture adequate volume   Culture   Final    NO GROWTH < 12 HOURS Performed at [facility], [address]  [place], [place] [address]    Report Status PENDING  Incomplete  Culture, blood (routine x 2)     Status: None (Preliminary result)   Collection Time: 03/01/20  9:54 PM   Specimen: Left Antecubital; Blood  Result Value Ref Range Status   Specimen Description LEFT ANTECUBITAL  Final   Special Requests   Final    BOTTLES DRAWN AEROBIC AND ANAEROBIC Blood Culture adequate volume   Culture   Final    NO GROWTH < 12 HOURS Performed at [facility], [address], [place], [place] [address]    Report Status PENDING  Incomplete  SARS Coronavirus 2 by RT PCR (hospital order, performed in [place] hospital lab) Nasopharyngeal Nasopharyngeal Swab     Status: None   Collection Time: 03/02/20 10:18 AM   Specimen: Nasopharyngeal Swab  Result Value Ref Range Status   SARS Coronavirus 2 NEGATIVE NEGATIVE Final    Comment: (NOTE) SARS-CoV-2 target nucleic acids are NOT DETECTED. The SARS-CoV-2 RNA is generally detectable in upper and lower respiratory specimens during the acute phase of infection. The lowest concentration of SARS-CoV-2 viral copies this assay can detect is 250 copies / mL. A negative result does not preclude SARS-CoV-2 infection and should not be used as the sole basis for treatment or other patient management decisions.  A negative  result may occur with improper specimen collection / handling, submission of specimen other than nasopharyngeal swab, presence of viral mutation(s) within the areas targeted by this assay, and inadequate number of viral copies (<250 copies / mL). A negative result must be combined with clinical observations, patient history, and epidemiological information. Fact Sheet for Patients:   StrictlyIdeas.no Fact Sheet for Healthcare Providers: BankingDealers.co.za This test is not yet approved or cleared  by the [place] FDA and has been authorized for detection and/or diagnosis of SARS-CoV-2 by FDA under an Emergency Use Authorization (EUA).  This EUA will remain in effect (meaning this test can be used) for the duration of the COVID-19 declaration under Section 564(b)(1) of the Act, 21 U.S.C. section 360bbb-3(b)(1), unless the authorization is terminated or revoked sooner. Performed at [facility], [address], [place], [place] [address]          Radiology Studies: CT ABDOMEN PELVIS WO CONTRAST  Result Date: 03/01/2020 CLINICAL DATA:  Acute abdominal pain and neutropenia EXAM: CT ABDOMEN AND PELVIS WITHOUT CONTRAST TECHNIQUE: Multidetector CT imaging of the abdomen and pelvis was performed following the standard protocol without IV contrast. COMPARISON:  08/26/2019 FINDINGS: Lower chest: Emphysematous changes with chronic scarring are identified. Hepatobiliary: Gallbladder has been surgically removed. No focal hepatic abnormality is seen. The common bile duct is significantly dilated extending to a chronic distal common bile duct stone unchanged from the prior study. Pancreas: Unremarkable. No pancreatic ductal dilatation or surrounding inflammatory changes. Spleen: Normal in size without focal abnormality. Adrenals/Urinary Tract: Adrenal glands are within normal limits. The right kidney is atrophic with scattered nonobstructing renal  stones. The largest of these lies in the lower pole measuring 6.6 mm. Cystic changes are noted bilaterally. Stable nonobstructing renal calculi are noted on the left. Fullness of the left collecting system is seen. No obstructing calculus is noted. Bladder is decompressed by suprapubic catheter. There is a calcification identified to the right of the midline which appears within the bladder consistent with a previously passed calculus. This may be related to the current symptomatology. Stomach/Bowel: Postsurgical changes are noted in the colon. The appendix has been surgically removed. Fluid-filled loops  of small bowel are noted. Diastasis of the rectus muscles is noted with herniation of bowel loops identified. This is stable in appearance from the prior exam. Vascular/Lymphatic: Aortic atherosclerosis. No enlarged abdominal or pelvic lymph nodes. Reproductive: Prostate is unremarkable. Other: No abdominal Dohse hernia or abnormality. No abdominopelvic ascites. Musculoskeletal: Degenerative changes of lumbar spine are noted. IMPRESSION: Bilateral nonobstructing renal calculi as described relatively stable from the prior exam. Calculus within a decompressed bladder consistent with previously passed stone. This was not present on the prior exam. This may have contributed to the patient's clinical symptomatology. Stable rectus diastasis with multiple bowel loops within. Stable distal common bile duct stone with significant biliary ductal dilatation. Electronically Signed   By: Inez Catalina M.D.   On: 03/01/2020 19:25        Scheduled Meds: . carvedilol  6.25 mg Oral BID WC  . diazepam  5 mg Oral BID  . feeding supplement (ENSURE ENLIVE)  237 mL Oral BID BM  . levothyroxine  75 mcg Oral Q0600  . verapamil  40 mg Oral BID   Continuous Infusions: . sodium chloride Stopped (03/02/20 1007)  . cefTRIAXone (ROCEPHIN)  IV Stopped (03/01/20 2310)     LOS: 0 days    Time spent: 15 minutes    Edwin Dada, MD [facility] 03/02/2020, 1:58 PM     Please page though [place] or Epic secure chat:  For password, contact charge nurse

## 2020-03-02 NOTE — Plan of Care (Signed)

## 2020-03-02 NOTE — Progress Notes (Signed)
[facility] Progress Note     Subjective: Patient admitted overnight with UTI and AKI. He is feeling somewhat better. He did eat this Am and had BM and no vomiting.  He says he still has abdominal pain.  CT done yesterday without any obstruction. He DOES have a ventral hernia and loss of domain. The semantics of the read is diastasis recti but he has had a laparotomy and has only hernia sac or peritoneum over the bowel. This is a TRUE hernia that is very large 8X9cm, and I have reviewed it with radiologist, Dr. Thornton Papas who agrees.   Objective: Vital signs in last 24 hours: Temp:  [96.9 F (36.1 C)-98.3 F (36.8 C)] 98.3 F (36.8 C) (06/03 1443) Pulse Rate:  [48-60] 55 (06/04 0630) Resp:  [12-16] 15 (06/04 0630) BP: (70-146)/(40-77) 126/66 (06/04 0630) SpO2:  [96 %-100 %] 100 % (06/04 0630) Weight:  [53.2 kg] 53.2 kg (06/03 1312)    Intake/Output from previous day: 06/03 0701 - 06/04 0700 In: 2100 [IV Piggyback:2100] Out: 200 [Urine:200] Intake/Output this shift: Total I/O In: 1095 [I.V.:1095] Out: -   General appearance: alert, cooperative and no distress GI: soft, tender, reducible hernia  Lab Results:  Recent Labs    03/01/20 1730 03/02/20 0338  WBC 7.9 5.8  HGB 12.7* 11.0*  HCT 40.4 35.2*  PLT 128* 117*   BMET Recent Labs    03/01/20 1730 03/02/20 0338  NA 136 139  K 5.1 4.5  CL 102 108  CO2 26 23  GLUCOSE 109* 87  BUN 49* 43*  CREATININE 2.48* 1.94*  CALCIUM 8.9 7.9*   Personally reviewed and reviewed with Dr. Thornton Papas radiology- herniation of bowel with large defect and loss of domain (see comment above).  Studies/Results: CT ABDOMEN PELVIS WO CONTRAST  Result Date: 03/01/2020 CLINICAL DATA:  Acute abdominal pain and neutropenia EXAM: CT ABDOMEN AND PELVIS WITHOUT CONTRAST TECHNIQUE: Multidetector CT imaging of the abdomen and pelvis was performed following the standard protocol without IV contrast. COMPARISON:  08/26/2019 FINDINGS: Lower  chest: Emphysematous changes with chronic scarring are identified. Hepatobiliary: Gallbladder has been surgically removed. No focal hepatic abnormality is seen. The common bile duct is significantly dilated extending to a chronic distal common bile duct stone unchanged from the prior study. Pancreas: Unremarkable. No pancreatic ductal dilatation or surrounding inflammatory changes. Spleen: Normal in size without focal abnormality. Adrenals/Urinary Tract: Adrenal glands are within normal limits. The right kidney is atrophic with scattered nonobstructing renal stones. The largest of these lies in the lower pole measuring 6.6 mm. Cystic changes are noted bilaterally. Stable nonobstructing renal calculi are noted on the left. Fullness of the left collecting system is seen. No obstructing calculus is noted. Bladder is decompressed by suprapubic catheter. There is a calcification identified to the right of the midline which appears within the bladder consistent with a previously passed calculus. This may be related to the current symptomatology. Stomach/Bowel: Postsurgical changes are noted in the colon. The appendix has been surgically removed. Fluid-filled loops of small bowel are noted. Diastasis of the rectus muscles is noted with herniation of bowel loops identified. This is stable in appearance from the prior exam. Vascular/Lymphatic: Aortic atherosclerosis. No enlarged abdominal or pelvic lymph nodes. Reproductive: Prostate is unremarkable. Other: No abdominal Mangal hernia or abnormality. No abdominopelvic ascites. Musculoskeletal: Degenerative changes of lumbar spine are noted. IMPRESSION: Bilateral nonobstructing renal calculi as described relatively stable from the prior exam. Calculus within a decompressed bladder consistent with previously passed stone.  This was not present on the prior exam. This may have contributed to the patient's clinical symptomatology. Stable rectus diastasis with multiple bowel loops  within. Stable distal common bile duct stone with significant biliary ductal dilatation. Electronically Signed   By: Inez Catalina M.D.   On: 03/01/2020 19:25    Anti-infectives: Anti-infectives (From admission, onward)   Start     Dose/Rate Route Frequency Ordered Stop   03/01/20 2145  cefTRIAXone (ROCEPHIN) 1 g in sodium chloride 0.9 % 100 mL IVPB     1 g 200 mL/hr over 30 Minutes Intravenous Every 24 hours 03/01/20 2136        Assessment/Plan: Mr. Behan is a 79 yo I met in my office for hernia who came in with hypotension, dehydration, poor intake. He was admitted with UTI in the setting of a suprapubic catheter.  He has a large ventral hernia and loss of domain, but this will not cause incarceration or strangulation, and he has no signs of obstruction. He would not qualify for separation of components repair given his age and co-morbidities and he understands.  I think his pain is coming from the UTI. He has eaten this AM and had a BM. No signs of obstruction.  Diet as tolerated. No plan for repair of the hernia  I have updated his health problems and have included ventral hernia in this with a description of my review.    LOS: 0 days

## 2020-03-02 NOTE — H&P (Signed)
History and Physical    Jordan Ward:[id number] DOB: Mar 06, 1941 DOA: 03/01/2020  PCP: [name], MD (Confirm with patient/family/NH records and if not entered, this has to be entered at [facility] point of entry) Patient coming from: Home  I have personally briefly reviewed patient's old medical records in [place]  Chief Complaint: Abdominal pain  HPI: Jordan Ward is a 79 y.o. male with medical history significant of hypertension, pulmonary fibrosis, chronic kidney disease, coronary artery disease, chronic back pain suprapubic catheter presented to ED for evaluation of abdominal pain and generalized weakness.  Patient states that he went to his primary care doctor who recommended to go to the ED but he went to see Dr. Reggie Pile surgery for evaluation of abdominal pain.  Dr. Constance Haw recommended him to go to ED for further management.  That he is having severe lower abdominal pain with nausea and vomiting he is not eating and drinking well and feeling very weak.  Otherwise denied fever, chills, chest pain, shortness of breath, nausea, vomiting, abdominal pain, constipation and diarrhea.    ED Course: On arrival to the ED patient had blood pressure of 123/77, heart rate 60, temperature 98.3, respiratory rate 14 and oxygen saturation 100% on room air.  Blood work showed WBC 7.9, hemoglobin 12.7, sodium 136, potassium 5.1, BUN 49, creatinine 2.48, blood glucose 109, lactic acid 2.1, alkaline phosphatase 226, AST 221, ALT 195.  UA positive for UTI and hematuria.  CT abdomen/pelvis showed bilateral nonobstructive renal calculi patient was given a bolus of IV normal saline in the ED.  Review of Systems: As per HPI otherwise 10 point review of systems negative.  Unacceptable ROS statements: "10 systems reviewed," "Extensive" (without elaboration).  Acceptable ROS statements: "All others negative," "All others reviewed and are negative," and "All others unremarkable," with at [place]  documented Can't double dip - if using for HPI can't use for ROS  Past Medical History:  Diagnosis Date  . Anemia   . Arthritis   . Back pain, chronic   . Bilateral carotid bruits   . C. difficile colitis 09/2011  . CAD (coronary artery disease)   . Carotid artery stenosis   . Cerebrovascular disease   . Cerebrovascular disease 08/17/2018  . Colon polyps   . Dyslipidemia   . Dysphagia 10/07/2011  . Encephalopathy   . Gait disorder   . HA (headache)   . High grade dysplasia in colonic adenoma 09/2005  . History of kidney stones   . HTN (hypertension), malignant 10/06/2011  . Hypernatremia   . Hypokalemia   . Hypothyroidism 10/08/2011  . Insomnia   . Junctional rhythm   . Kidney stones   . MS (multiple sclerosis) ([place])   . Neuromuscular disorder (HCC)    MS  . OSA (obstructive sleep apnea)   . Paroxysmal atrial tachycardia ([place])   . Peripheral vascular disease ([place])   . Pneumonia 4 yrs ago  . Pulmonary fibrosis ([place]) 10/06/2011  . Pulmonary nodule 10/08/2011  . PVD (peripheral vascular disease) ([place])   . Sacral ulcer ([place])   . Sleep apnea    cannot tolerate  . Stroke [facility])    left sided weakness  . Suprapubic catheter ([place])   . TIA (transient ischemic attack)   . Tremors of nervous system 10/08/2011  . Urinary tract infection     Past Surgical History:  Procedure Laterality Date  . APPENDECTOMY  09/2005   at time of left hemicolectomy  .  BACK SURGERY  1976/1979   lower  . CATARACT EXTRACTION W/PHACO Right 03/08/2018   Procedure: CATARACT EXTRACTION PHACO AND INTRAOCULAR LENS PLACEMENT RIGHT EYE;  Surgeon: Tonny Branch, MD;  Location: [facility];  Service: Ophthalmology;  Laterality: Right;  CDE: 8.86  . CATARACT EXTRACTION W/PHACO Left 04/05/2018   Procedure: CATARACT EXTRACTION PHACO AND INTRAOCULAR LENS PLACEMENT (IOC);  Surgeon: Tonny Branch, MD;  Location: [facility];  Service: Ophthalmology;  Laterality: Left;  CDE: 7.36  . CHOLECYSTECTOMY     Dr. Tamala Julian  . COLON SURGERY  09/2005    Fleishman: four tubular adenomas, large adenomatous polyp with HIGH GRADE dysplasia  . COLONOSCOPY  11/2004   Dr. Sharol Roussel sessile polyp splenic flexure, 49mm sessile polyp desc colon, tubulovillous adenoma (bx not removed)  . COLONOSCOPY  01/2005   poor prep, polyp could not be found  . COLONOSCOPY  05/2005   with EMR, polypectomy Dr. Olegario Messier, bx showed high grade dysplasia, partially resected  . COLONOSCOPY  09/2005   Dr. Arsenio Loader, [place] ink tattooing, four villous colon polyp (3 had been missed on previous colonoscopies due to limitations of procedures  . COLONOSCOPY  09/2006   normal TI, no polyps  . COLONOSCOPY  10/2007   Dr. Imogene Burn distal mammillations, benign bx, normal TI, random bx neg for microscopic colitis  . CYSTOSCOPY WITH LITHOLAPAXY N/A 07/27/2018   Procedure: CYSTOSCOPY WITH LITHOLAPAXY VIA  SUPRAPUBIC TUBE;  Surgeon: Franchot Gallo, MD;  Location: [facility];  Service: Urology;  Laterality: N/A;  . CYSTOSCOPY WITH RETROGRADE PYELOGRAM, URETEROSCOPY AND STENT PLACEMENT Left 06/09/2017   Procedure: CYSTOSCOPY WITH LEFT RETROGRADE PYELOGRAM, LEFT URETEROSCOPY, LEFT URETEROSCOPIC STONE EXTRACTION, LEFT URETERAL STENT PLACEMENT;  Surgeon: Franchot Gallo, MD;  Location: [facility];  Service: Urology;  Laterality: Left;  . INGUINAL HERNIA REPAIR  1971   bilateral  . INSERTION OF SUPRAPUBIC CATHETER  06/09/2017   Procedure: EXCHANGE OF SUPRAPUBIC CATHETER;  Surgeon: Franchot Gallo, MD;  Location: [facility];  Service: Urology;;  . IR NEPHROSTOMY PLACEMENT LEFT  05/26/2017  . KIDNEY STONE SURGERY  09/13/2015  . NEPHROLITHOTOMY Left 09/13/2015   Procedure: LEFT PERCUTANEOUS NEPHROLITHOTOMY ;  Surgeon: Franchot Gallo, MD;  Location: [facility];  Service: Urology;  Laterality: Left;  . SUPRAPUBIC CATHETER INSERTION       reports that he quit smoking about 32 years ago. His smoking use included cigarettes. He has a 25.00 pack-year smoking history. He has never used  smokeless tobacco. He reports that he does not drink alcohol or use drugs.  Allergies  Allergen Reactions  . Tetracyclines & Related Anaphylaxis and Rash  . Ciprofloxacin     Trouble swallowing unknown reaction according to wife     Family History  Problem Relation Age of Onset  . Cirrhosis Brother        etoh  . Stroke Mother 37  . Coronary artery disease Father 21  . Heart attack Brother   . Cancer Sister   . Multiple sclerosis Other   . Colon cancer Neg Hx     Unacceptable: Noncontributory, unremarkable, or negative. Acceptable: (example)Family history negative for heart disease  Prior to Admission medications   Medication Sig Start Date End Date Taking? Authorizing Provider  carvedilol (COREG) 6.25 MG tablet Take 1 tablet (6.25 mg total) by mouth 2 (two) times daily with a meal. Patient taking differently: Take 6.25 mg by mouth 2 (two) times daily with a meal. ALL MEDICATIONS TO BE CRUSHED AND PLACED IN APPLESAUCE 07/26/14  Yes Elmahi, Mutaz,  MD  diazepam (VALIUM) 5 MG tablet TAKE (1) TABLET BY MOUTH TWICE DAILY. Patient taking differently: Take 5 mg by mouth in the morning and at bedtime. ALL MEDICATIONS TO BE CRUSHED AND PLACED IN APPLESAUCE 11/14/19  Yes Kathrynn Ducking, MD  Glycerin-Hypromellose-PEG 400 (DRY EYE RELIEF DROPS) 0.2-0.2-1 % SOLN Apply 1-2 drops to eye daily as needed (for dry eyes).   Yes [provider]  HYDROcodone-acetaminophen (NORCO) 7.5-325 MG per tablet Take 1 tablet by mouth 2 (two) times daily. Max APAP 3 GM IN 24 HOURS FROM ALL SOURCES Patient taking differently: Take 1 tablet by mouth in the morning and at bedtime. ALL MEDICATIONS TO BE CRUSHED AND PLACED IN APPLESAUCE 08/22/14  Yes Pandey, Mahima, MD  levothyroxine (SYNTHROID) 75 MCG tablet Take 75 mcg by mouth daily before breakfast. ALL MEDICATIONS TO BE CRUSHED AND PLACED IN APPLESAUCE 12/21/19  Yes [provider]  mirabegron ER (MYRBETRIQ) 25 MG TB24 tablet Take 25 mg by mouth  daily as needed (bladder spasms).   Yes [provider]  potassium chloride SA (K-DUR,KLOR-CON) 20 MEQ tablet Take 20 mEq by mouth every morning. ALL MEDICATIONS TO BE CRUSHED AND PLACED IN APPLESAUCE   Yes Rexene Alberts, MD  verapamil (CALAN) 40 MG tablet Take 40 mg by mouth 2 (two) times daily. ALL MEDICATIONS TO BE CRUSHED AND PLACED IN APPLESAUCE   Yes [provider]    Physical Exam: Vitals:   03/01/20 2245 03/01/20 2300 03/02/20 0628 03/02/20 0630  BP:  (!) 146/72 120/61 126/66  Pulse: (!) 58 (!) 59 (!) 56 (!) 55  Resp:    15  Temp:      TempSrc:      SpO2: 97% 96% 99% 100%    Constitutional: NAD, calm, comfortable Vitals:   03/01/20 2245 03/01/20 2300 03/02/20 0628 03/02/20 0630  BP:  (!) 146/72 120/61 126/66  Pulse: (!) 58 (!) 59 (!) 56 (!) 55  Resp:    15  Temp:      TempSrc:      SpO2: 97% 96% 99% 100%   Eyes: PERRL, lids and conjunctivae normal ENMT: Mucous membranes are dry. Posterior pharynx clear of any exudate or lesions.Normal dentition.  Neck: normal, supple, no masses, no thyromegaly Respiratory: clear to auscultation bilaterally, no wheezing, no crackles. Normal respiratory effort. No accessory muscle use.  Cardiovascular: Regular rate and rhythm, no murmurs / rubs / gallops. No extremity edema. 2+ pedal pulses. No carotid bruits.  Abdomen: Soft, nondistended but positive for tenderness in lower abdomen and suprapubic area.  No hepatosplenomegaly. Bowel sounds positive.  Suprapubic catheter. Musculoskeletal: no clubbing / cyanosis. No joint deformity upper and lower extremities. Good ROM, no contractures. Normal muscle tone.  Skin: no rashes, lesions, ulcers. No induration Neurologic: CN 2-12 grossly intact. Sensation intact, DTR normal. Strength 4 /5 in bilateral lower extremities Psychiatric: Normal judgment and insight. Alert and oriented x 3. Normal mood.   (Anything < 9 systems with 2 bullets each down codes to level 1) (If patient  refuses exam can't bill higher level) (Make sure to document decubitus ulcers present on admission -- if possible -- and whether patient has chronic indwelling catheter at time of admission)  Labs on Admission: I have personally reviewed following labs and imaging studies  CBC: Recent Labs  Lab 03/01/20 1730 03/02/20 0338  WBC 7.9 5.8  NEUTROABS 5.0  --   HGB 12.7* 11.0*  HCT 40.4 35.2*  MCV 98.3 99.2  PLT 128* 117*  Basic Metabolic Panel: Recent Labs  Lab 03/01/20 1730 03/02/20 0338  NA 136 139  K 5.1 4.5  CL 102 108  CO2 26 23  GLUCOSE 109* 87  BUN 49* 43*  CREATININE 2.48* 1.94*  CALCIUM 8.9 7.9*   GFR: Estimated Creatinine Clearance: 24.4 mL/min (A) (by C-G formula based on SCr of 1.94 mg/dL (H)). Liver Function Tests: Recent Labs  Lab 03/01/20 1730  AST 221*  ALT 195*  ALKPHOS 226*  BILITOT 1.6*  PROT 7.1  ALBUMIN 3.8   No results for input(s): LIPASE, AMYLASE in the last 168 hours. No results for input(s): AMMONIA in the last 168 hours. Coagulation Profile: No results for input(s): INR, PROTIME in the last 168 hours. Cardiac Enzymes: No results for input(s): CKTOTAL, CKMB, CKMBINDEX, TROPONINI in the last 168 hours. BNP (last 3 results) No results for input(s): PROBNP in the last 8760 hours. HbA1C: No results for input(s): HGBA1C in the last 72 hours. CBG: No results for input(s): GLUCAP in the last 168 hours. Lipid Profile: No results for input(s): CHOL, HDL, LDLCALC, TRIG, CHOLHDL, LDLDIRECT in the last 72 hours. Thyroid Function Tests: No results for input(s): TSH, T4TOTAL, FREET4, T3FREE, THYROIDAB in the last 72 hours. Anemia Panel: No results for input(s): VITAMINB12, FOLATE, FERRITIN, TIBC, IRON, RETICCTPCT in the last 72 hours. Urine analysis:    Component Value Date/Time   COLORURINE AMBER (A) 03/01/2020 1801   APPEARANCEUR CLOUDY (A) 03/01/2020 1801   LABSPEC 1.016 03/01/2020 1801   PHURINE 8.0 03/01/2020 1801   GLUCOSEU NEGATIVE  03/01/2020 1801   HGBUR MODERATE (A) 03/01/2020 1801   BILIRUBINUR NEGATIVE 03/01/2020 1801   KETONESUR NEGATIVE 03/01/2020 1801   PROTEINUR 100 (A) 03/01/2020 1801   UROBILINOGEN 0.2 07/17/2014 1215   NITRITE POSITIVE (A) 03/01/2020 1801   LEUKOCYTESUR MODERATE (A) 03/01/2020 1801    Radiological Exams on Admission: CT ABDOMEN PELVIS WO CONTRAST  Result Date: 03/01/2020 CLINICAL DATA:  Acute abdominal pain and neutropenia EXAM: CT ABDOMEN AND PELVIS WITHOUT CONTRAST TECHNIQUE: Multidetector CT imaging of the abdomen and pelvis was performed following the standard protocol without IV contrast. COMPARISON:  08/26/2019 FINDINGS: Lower chest: Emphysematous changes with chronic scarring are identified. Hepatobiliary: Gallbladder has been surgically removed. No focal hepatic abnormality is seen. The common bile duct is significantly dilated extending to a chronic distal common bile duct stone unchanged from the prior study. Pancreas: Unremarkable. No pancreatic ductal dilatation or surrounding inflammatory changes. Spleen: Normal in size without focal abnormality. Adrenals/Urinary Tract: Adrenal glands are within normal limits. The right kidney is atrophic with scattered nonobstructing renal stones. The largest of these lies in the lower pole measuring 6.6 mm. Cystic changes are noted bilaterally. Stable nonobstructing renal calculi are noted on the left. Fullness of the left collecting system is seen. No obstructing calculus is noted. Bladder is decompressed by suprapubic catheter. There is a calcification identified to the right of the midline which appears within the bladder consistent with a previously passed calculus. This may be related to the current symptomatology. Stomach/Bowel: Postsurgical changes are noted in the colon. The appendix has been surgically removed. Fluid-filled loops of small bowel are noted. Diastasis of the rectus muscles is noted with herniation of bowel loops identified. This is  stable in appearance from the prior exam. Vascular/Lymphatic: Aortic atherosclerosis. No enlarged abdominal or pelvic lymph nodes. Reproductive: Prostate is unremarkable. Other: No abdominal Rehman hernia or abnormality. No abdominopelvic ascites. Musculoskeletal: Degenerative changes of lumbar spine are noted. IMPRESSION: Bilateral nonobstructing renal calculi  as described relatively stable from the prior exam. Calculus within a decompressed bladder consistent with previously passed stone. This was not present on the prior exam. This may have contributed to the patient's clinical symptomatology. Stable rectus diastasis with multiple bowel loops within. Stable distal common bile duct stone with significant biliary ductal dilatation. Electronically Signed   By: Inez Catalina M.D.   On: 03/01/2020 19:25      Assessment/Plan Principal Problem:   Urinary tract infection Patient started on IV ceftriaxone. Blood and urine culture ordered. Tylenol as needed for fever and mild pain. IV fluid rate of 100 mL/h.  Active Problems: Hematuria UA is also positive for hematuria that is most probably secondary to the passing out of bladder stone.  Hemoglobin is 12.7.  Continue to monitor CBC.  Will hold chemical prophylaxis for DVT because of hematuria.  Acute kidney injury on CKD. Patient has a creatinine of 2.48 while the baseline creatinine is between 1.3-1.5.Acute kidney injury is most likely secondary to dehydration from poor oral intake.  Continue gentle hydration with IV normal saline at the rate of 100 mL/h.  Avoid nephrotoxic drugs.  Continue to monitor BMP.  Intractable nausea and vomiting with abdominal pain IV Zofran as needed for nausea and vomiting. Tylenol for mild pain as needed, Norco as needed for moderate pain while morphine IV 2 mg every 4 hours as needed for severe pain ordered.    Pulmonary fibrosis ([place]) Stable Patient is not having any respiratory distress and saturating well on room  air.    Hypothyroidism Continue home medication  Hypertension Patient was sent to the ED from general surgery office because of hypotension.  Blood pressure is 123/77 at this time.  Hold the blood pressure medications will restart them once the blood pressure becomes stable.      DVT prophylaxis: No chemical prophylaxis because of hematuria.  Sequential compression devices ordered. Code Status: Full code Family Communication: No family member present at bedside Disposition Plan:  Consults called: None Admission status: Observation/MedSurg   Edmonia Lynch MD [facility] Pager [phone]   If 7PM-7AM, please contact night-coverage [email or web address] Password   03/02/2020, 7:09 AM

## 2020-03-02 NOTE — ED Notes (Signed)
Pt set up for breakfast and given emesis bag

## 2020-03-03 ENCOUNTER — Inpatient Hospital Stay (HOSPITAL_COMMUNITY): Payer: Medicare Other | Admitting: Anesthesiology

## 2020-03-03 ENCOUNTER — Inpatient Hospital Stay (HOSPITAL_COMMUNITY): Payer: Medicare Other

## 2020-03-03 ENCOUNTER — Encounter (HOSPITAL_COMMUNITY)
Admission: EM | Disposition: A | Discharge status: Home / Self Care | Payer: Self-pay | Source: Home / Self Care | Attending: Internal Medicine

## 2020-03-03 DIAGNOSIS — K8051 Calculus of bile duct without cholangitis or cholecystitis with obstruction: Secondary | ICD-10-CM

## 2020-03-03 HISTORY — PX: ERCP: SHX5425

## 2020-03-03 HISTORY — PX: LITHOTRIPSY: SHX5546

## 2020-03-03 HISTORY — PX: SPHINCTEROTOMY: SHX5544

## 2020-03-03 HISTORY — PX: BILIARY DILATION: SHX6850

## 2020-03-03 LAB — CBC WITH DIFFERENTIAL/PLATELET
Abs Immature Granulocytes: 0 10*3/uL (ref 0.00–0.07)
Basophils Absolute: 0 10*3/uL (ref 0.0–0.1)
Basophils Relative: 1 %
Eosinophils Absolute: 0.3 10*3/uL (ref 0.0–0.5)
Eosinophils Relative: 6 %
HCT: 36.2 % — ABNORMAL LOW (ref 39.0–52.0)
Hemoglobin: 11.5 g/dL — ABNORMAL LOW (ref 13.0–17.0)
Immature Granulocytes: 0 %
Lymphocytes Relative: 39 %
Lymphs Abs: 2.1 10*3/uL (ref 0.7–4.0)
MCH: 31.5 pg (ref 26.0–34.0)
MCHC: 31.8 g/dL (ref 30.0–36.0)
MCV: 99.2 fL (ref 80.0–100.0)
Monocytes Absolute: 0.5 10*3/uL (ref 0.1–1.0)
Monocytes Relative: 9 %
Neutro Abs: 2.5 10*3/uL (ref 1.7–7.7)
Neutrophils Relative %: 45 %
Platelets: 123 10*3/uL — ABNORMAL LOW (ref 150–400)
RBC: 3.65 MIL/uL — ABNORMAL LOW (ref 4.22–5.81)
RDW: 13.4 % (ref 11.5–15.5)
WBC: 5.4 10*3/uL (ref 4.0–10.5)
nRBC: 0 % (ref 0.0–0.2)

## 2020-03-03 LAB — PROTIME-INR
INR: 1.1 (ref 0.8–1.2)
Prothrombin Time: 13.6 seconds (ref 11.4–15.2)

## 2020-03-03 LAB — HEPATIC FUNCTION PANEL
ALT: 76 U/L — ABNORMAL HIGH (ref 0–44)
AST: 35 U/L (ref 15–41)
Albumin: 3 g/dL — ABNORMAL LOW (ref 3.5–5.0)
Alkaline Phosphatase: 144 U/L — ABNORMAL HIGH (ref 38–126)
Bilirubin, Direct: 0.1 mg/dL (ref 0.0–0.2)
Indirect Bilirubin: 0.3 mg/dL (ref 0.3–0.9)
Total Bilirubin: 0.4 mg/dL (ref 0.3–1.2)
Total Protein: 5.6 g/dL — ABNORMAL LOW (ref 6.5–8.1)

## 2020-03-03 LAB — BASIC METABOLIC PANEL
Anion gap: 7 (ref 5–15)
BUN: 25 mg/dL — ABNORMAL HIGH (ref 8–23)
CO2: 27 mmol/L (ref 22–32)
Calcium: 8.2 mg/dL — ABNORMAL LOW (ref 8.9–10.3)
Chloride: 107 mmol/L (ref 98–111)
Creatinine, Ser: 1.3 mg/dL — ABNORMAL HIGH (ref 0.61–1.24)
GFR calc Af Amer: >60 mL/min (ref >60)
GFR calc non Af Amer: 53 mL/min — ABNORMAL LOW (ref >60)
Glucose, Bld: 97 mg/dL (ref 70–99)
Potassium: 3.8 mmol/L (ref 3.5–5.1)
Sodium: 141 mmol/L (ref 135–145)

## 2020-03-03 SURGERY — ERCP, WITH INTERVENTION IF INDICATED
Anesthesia: General | Wound class: Contaminated

## 2020-03-03 SURGERY — ERCP, WITH INTERVENTION IF INDICATED
Anesthesia: General

## 2020-03-03 MED ORDER — DEXAMETHASONE SODIUM PHOSPHATE 10 MG/ML IJ SOLN
INTRAMUSCULAR | Status: AC
  Filled 2020-03-03: qty 1

## 2020-03-03 MED ORDER — LIDOCAINE 2% (20 MG/ML) 5 ML SYRINGE
INTRAMUSCULAR | Status: AC
  Filled 2020-03-03: qty 5

## 2020-03-03 MED ORDER — FENTANYL CITRATE (PF) 100 MCG/2ML IJ SOLN
INTRAMUSCULAR | Status: AC
  Filled 2020-03-03: qty 2

## 2020-03-03 MED ORDER — DEXAMETHASONE SODIUM PHOSPHATE 10 MG/ML IJ SOLN
INTRAMUSCULAR | Status: DC | PRN
Start: 2020-03-03 — End: 2020-03-03
  Administered 2020-03-03: 5 mg via INTRAVENOUS

## 2020-03-03 MED ORDER — ONDANSETRON HCL 4 MG/2ML IJ SOLN
INTRAMUSCULAR | Status: DC | PRN
  Administered 2020-03-03: 4 mg via INTRAVENOUS

## 2020-03-03 MED ORDER — LIDOCAINE HCL (CARDIAC) PF 100 MG/5ML IV SOSY
PREFILLED_SYRINGE | INTRAVENOUS | Status: DC | PRN
  Administered 2020-03-03: 60 mg via INTRATRACHEAL

## 2020-03-03 MED ORDER — GLYCOPYRROLATE PF 0.2 MG/ML IJ SOSY
PREFILLED_SYRINGE | INTRAMUSCULAR | Status: AC
  Filled 2020-03-03: qty 1

## 2020-03-03 MED ORDER — PROPOFOL 10 MG/ML IV BOLUS
INTRAVENOUS | Status: DC | PRN
  Administered 2020-03-03: 20 mg via INTRAVENOUS
  Administered 2020-03-03: 80 mg via INTRAVENOUS

## 2020-03-03 MED ORDER — ONDANSETRON HCL 4 MG/2ML IJ SOLN
INTRAMUSCULAR | Status: AC
  Filled 2020-03-03: qty 2

## 2020-03-03 MED ORDER — FENTANYL CITRATE (PF) 100 MCG/2ML IJ SOLN
INTRAMUSCULAR | Status: DC | PRN
  Administered 2020-03-03 (×2): 25 ug via INTRAVENOUS
  Administered 2020-03-03: 50 ug via INTRAVENOUS

## 2020-03-03 MED ORDER — SODIUM CHLORIDE 0.9 % IV SOLN
INTRAVENOUS | Status: DC | PRN
  Administered 2020-03-03: 10 mL

## 2020-03-03 MED ORDER — SODIUM CHLORIDE (PF) 0.9 % IJ SOLN
INTRAMUSCULAR | Status: AC
  Filled 2020-03-03: qty 10

## 2020-03-03 MED ORDER — WATER FOR IRRIGATION, STERILE IR SOLN
Status: DC | PRN
  Administered 2020-03-03: 1000 mL via TOPICAL

## 2020-03-03 MED ORDER — ROCURONIUM BROMIDE 10 MG/ML (PF) SYRINGE
PREFILLED_SYRINGE | INTRAVENOUS | Status: DC | PRN
  Administered 2020-03-03: 40 mg via INTRAVENOUS

## 2020-03-03 MED ORDER — SUCCINYLCHOLINE CHLORIDE 200 MG/10ML IV SOSY
PREFILLED_SYRINGE | INTRAVENOUS | Status: AC
  Filled 2020-03-03: qty 10

## 2020-03-03 MED ORDER — BOOST / RESOURCE BREEZE PO LIQD CUSTOM
1.0000 | ORAL | Status: DC
  Administered 2020-03-03: 1 via ORAL

## 2020-03-03 MED ORDER — ADULT MULTIVITAMIN W/MINERALS CH
1.0000 | ORAL_TABLET | Freq: Every day | ORAL | Status: DC
  Administered 2020-03-03 – 2020-03-04 (×2): 1 via ORAL
  Filled 2020-03-03 (×2): qty 1

## 2020-03-03 MED ORDER — ROCURONIUM BROMIDE 10 MG/ML (PF) SYRINGE
PREFILLED_SYRINGE | INTRAVENOUS | Status: AC
  Filled 2020-03-03: qty 10

## 2020-03-03 MED ORDER — PROPOFOL 10 MG/ML IV BOLUS
INTRAVENOUS | Status: AC
  Filled 2020-03-03: qty 20

## 2020-03-03 MED ORDER — EPHEDRINE SULFATE 50 MG/ML IJ SOLN
INTRAMUSCULAR | Status: DC | PRN
  Administered 2020-03-03: 5 mg via INTRAVENOUS

## 2020-03-03 MED ORDER — GLUCAGON HCL RDNA (DIAGNOSTIC) 1 MG IJ SOLR
INTRAMUSCULAR | Status: DC | PRN
  Administered 2020-03-03 (×2): .25 mg via INTRAVENOUS

## 2020-03-03 MED ORDER — LIDOCAINE VISCOUS HCL 2 % MT SOLN
OROMUCOSAL | Status: AC
  Filled 2020-03-03: qty 15

## 2020-03-03 MED ORDER — SODIUM CHLORIDE 0.9 % IV SOLN: INTRAVENOUS | Status: DC

## 2020-03-03 MED ORDER — SUGAMMADEX SODIUM 200 MG/2ML IV SOLN
INTRAVENOUS | Status: DC | PRN
Start: 2020-03-03 — End: 2020-03-03
  Administered 2020-03-03: 120 mg via INTRAVENOUS

## 2020-03-03 MED ORDER — GLYCOPYRROLATE PF 0.2 MG/ML IJ SOSY
PREFILLED_SYRINGE | INTRAMUSCULAR | Status: DC | PRN
  Administered 2020-03-03: .1 mg via INTRAVENOUS

## 2020-03-03 MED ORDER — HYDROMORPHONE HCL 1 MG/ML IJ SOLN: 0.2500 mg | INTRAMUSCULAR | Status: DC | PRN

## 2020-03-03 MED ORDER — LACTATED RINGERS IV SOLN: Freq: Once | INTRAVENOUS | Status: AC

## 2020-03-03 MED ORDER — GLUCAGON HCL RDNA (DIAGNOSTIC) 1 MG IJ SOLR
INTRAMUSCULAR | Status: AC
  Filled 2020-03-03: qty 2

## 2020-03-03 MED ORDER — ARTIFICIAL TEARS OPHTHALMIC OINT
TOPICAL_OINTMENT | OPHTHALMIC | Status: DC | PRN
  Administered 2020-03-03: 1 via OPHTHALMIC

## 2020-03-03 NOTE — Progress Notes (Signed)
Patient stated he wore CPAP at one time in the past. He has since stopped. He declined use of CPAP. Order will be DC

## 2020-03-03 NOTE — Progress Notes (Signed)
Subjective:  Patient complains of periumbilical pain.  He denies chest pain shortness of breath or epigastric pain.  He wants to know if his supra pubic catheter will be changed again today.  He says it was changed 1 week ago.  He is under care of Dr. [name]. Patient states he lives at home with his wife and their daughter.  He does not drive anymore.  He states when he was in nursing home his license expired and he decided not to renew it.  Current Medications:  Current Facility-Administered Medications:  .  0.9 %  sodium chloride infusion, , Intravenous, Continuous, Bunnie Pion Z, DO, Last Rate: 100 mL/hr at 03/03/20 0616, New Bag at 03/03/20 0616 .  acetaminophen (TYLENOL) tablet 650 mg, 650 mg, Oral, Q6H PRN **OR** acetaminophen (TYLENOL) suppository 650 mg, 650 mg, Rectal, Q6H PRN, Bunnie Pion Z, DO .  carvedilol (COREG) tablet 6.25 mg, 6.25 mg, Oral, BID WC, Bunnie Pion Z, DO, 6.25 mg at 03/02/20 1811 .  cefTRIAXone (ROCEPHIN) 1 g in sodium chloride 0.9 % 100 mL IVPB, 1 g, Intravenous, Q24H, Bunnie Pion Z, DO, Last Rate: 200 mL/hr at 03/02/20 2208, 1 g at 03/02/20 2208 .  diazepam (VALIUM) tablet 5 mg, 5 mg, Oral, BID, Danford, Suann Akon, MD, 5 mg at 03/02/20 2209 .  feeding supplement (ENSURE ENLIVE) (ENSURE ENLIVE) liquid 237 mL, 237 mL, Oral, BID BM, Danford, Suann Octavio, MD, 237 mL at 03/02/20 1616 .  glucagon (human recombinant) (GLUCAGEN) 1 MG injection, , , ,  .  HYDROcodone-acetaminophen (NORCO/VICODIN) 5-325 MG per tablet 1-2 tablet, 1-2 tablet, Oral, Q4H PRN, Bunnie Pion Z, DO, 2 tablet at 03/02/20 2220 .  levothyroxine (SYNTHROID) tablet 75 mcg, 75 mcg, Oral, Q0600, Bunnie Pion Z, DO, 75 mcg at 03/03/20 7782 .  mirabegron ER (MYRBETRIQ) tablet 25 mg, 25 mg, Oral, Daily PRN, Bunnie Pion Z, DO .  morphine 2 MG/ML injection 2 mg, 2 mg, Intravenous, Q4H PRN, Humphrey Rolls, Mohammad Z, DO .  ondansetron (ZOFRAN) tablet 4 mg, 4 mg, Oral, Q6H PRN **OR** ondansetron  (ZOFRAN) injection 4 mg, 4 mg, Intravenous, Q6H PRN, Bunnie Pion Z, DO .  verapamil (CALAN) tablet 40 mg, 40 mg, Oral, BID, Bunnie Pion Z, DO, 40 mg at 03/02/20 2209   Objective: Blood pressure (!) 174/83, pulse (!) 50, temperature 98.5 F (36.9 C), temperature source Oral, resp. rate 18, height _0  (1.651 m), weight 53.5 kg, SpO2 99 %. Patient is alert and in no acute distress. Conjunctiva is pink. Sclera is nonicteric Oropharyngeal mucosa is normal. No neck masses or thyromegaly noted. Cardiac exam with regular rhythm normal S1 and S2. No murmur or gallop noted. Lungs are clear to auscultation. Abdomen is flat with large ventral hernia in mid abdomen.  Bowel sounds are normal.  He has suprapubic catheter in place.  He has mild periumbilical tenderness.  No organomegaly or masses. Extremities are thin but no clubbing or edema noted.  Labs/studies Results:  CBC Latest Ref Rng & Units 03/03/2020 03/02/2020 03/01/2020  WBC 4.0 - 10.5 K/uL 5.4 5.8 7.9  Hemoglobin 13.0 - 17.0 g/dL 11.5(L) 11.0(L) 12.7(L)  Hematocrit 39.0 - 52.0 % 36.2(L) 35.2(L) 40.4  Platelets 150 - 400 K/uL 123(L) 117(L) 128(L)    CMP Latest Ref Rng & Units 03/03/2020 03/02/2020 03/01/2020  Glucose 70 - 99 mg/dL 97 87 109(H)  BUN 8 - 23 mg/dL 25(H) 43(H) 49(H)  Creatinine 0.61 - 1.24 mg/dL 1.30(H) 1.94(H) 2.48(H)  Sodium 135 - 145 mmol/L 141  139 136  Potassium 3.5 - 5.1 mmol/L 3.8 4.5 5.1  Chloride 98 - 111 mmol/L 107 108 102  CO2 22 - 32 mmol/L _0 Calcium 8.9 - 10.3 mg/dL 8.2(L) 7.9(L) 8.9  Total Protein 6.5 - 8.1 g/dL 5.6(L) 5.8(L) 7.1  Total Bilirubin 0.3 - 1.2 mg/dL 0.4 1.0 1.6(H)  Alkaline Phos 38 - 126 U/L 144(H) 171(H) 226(H)  AST 15 - 41 U/L 35 100(H) 221(H)  ALT 0 - 44 U/L 76(H) 122(H) 195(H)    Hepatic Function Latest Ref Rng & Units 03/03/2020 03/02/2020 03/01/2020  Total Protein 6.5 - 8.1 g/dL 5.6(L) 5.8(L) 7.1  Albumin 3.5 - 5.0 g/dL 3.0(L) 3.1(L) 3.8  AST 15 - 41 U/L 35 100(H) 221(H)  ALT 0 - 44  U/L 76(H) 122(H) 195(H)  Alk Phosphatase 38 - 126 U/L 144(H) 171(H) 226(H)  Total Bilirubin 0.3 - 1.2 mg/dL 0.4 1.0 1.6(H)  Bilirubin, Direct 0.0 - 0.2 mg/dL 0.1 0.3(H) 0.6(H)    INR is 1.1.  Covid test negative.  Urine culture positive for gram-negative rods ID pending.  Blood cultures negative.  Abdominal pelvic CT images reviewed.  Patient has large calcified stone in distal CBD.  Extra and intrahepatic biliary dilation.   Assessment:  #1.  Choledocholithiasis.  He has had stone in his bile duct dating back to October 2015.  No prior history of therapeutic intervention.  It appears he is having intermittent biliary obstruction.  Transaminases and bilirubin are now down.  I agree he is at risk for cholangitis and biliary pancreatitis and would benefit from therapeutic ERCP.  I have reviewed the procedure and risks such as post ERCP pancreatitis duodenal injury and bleeding.  I also talk with patient's wife over the phone.  They are both agreeable. Patient with multiple comorbidities but appears to be stable this morning. Patient is on ceftriaxone which should be adequate for biliary coverage.   Will let him take carvedilol with few sips of water.  #2.  Urinary tract infection secondary to gram-negative organisms.  ID pending.  Patient is on ceftriaxone.  Plan:  ERCP with biliary sphincterotomy and stone extraction.  The stone is large and will possibly need lithotripsy.  If stone cannot be removed may consider biliary stenting.

## 2020-03-03 NOTE — Anesthesia Preprocedure Evaluation (Addendum)
Anesthesia Evaluation  Patient identified by MRN, date of birth, ID band Patient awake    Reviewed: Allergy & Precautions, NPO status , Patient's Chart, lab work & pertinent test results, reviewed documented beta blocker date and time   Airway Mallampati: II  TM Distance: >3 FB Neck ROM: Full    Dental  (+) Poor Dentition, Dental Advisory Given, Missing   Pulmonary sleep apnea , pneumonia, former smoker,    Pulmonary exam normal breath sounds clear to auscultation       Cardiovascular Exercise Tolerance: Poor hypertension, Pt. on medications and Pt. on home beta blockers + CAD, + Past MI, + Peripheral Vascular Disease and +CHF  Normal cardiovascular exam Rhythm:Regular Rate:Normal  Left ventricle: The cavity size was normal. Boozer thickness was  increased in a pattern of mild LVH. Systolic function was normal.  The estimated ejection fraction was in the range of 50% to 55%.  There is hypokinesis of the basalinferior myocardium. Doppler  parameters are consistent with abnormal left ventricular  relaxation (grade 1 diastolic dysfunction).  - Aortic valve: A bicuspid morphology cannot be excluded. Moderate  thickening and calcification involving the noncoronary cusp.  Valve mobility was restricted. Mean gradient (S): 4 mm Hg. Peak  gradient (S): 10 mm Hg. VTI ratio of LVOT to aortic valve: 0.61.  Valve area (Vmax): 1.98 cm^2.  - Mitral valve: There was mild regurgitation.  - Left atrium: The atrium was mildly dilated.  - Right atrium: Central venous pressure (est): 3 mm Hg.  - Atrial septum: No defect or patent foramen ovale was identified.  - Tricuspid valve: There was mild regurgitation.  - Pulmonary arteries: PA peak pressure: 39 mm Hg (S).  - Pericardium, extracardiac: There was no pericardial effusion.  22-Jul-2018 13:57:42 [place] [facility]-AP-300 ROUTINE RECORD Sinus bradycardia Left ventricular  hypertrophy with repolarization abnormality Abnormal ECG Confirmed by Sinda Du ([id number]) on 07/26/2018 12:31:27 PM    Neuro/Psych  Headaches, PSYCHIATRIC DISORDERS TIA Neuromuscular disease (multiple sclerosis) CVA, Residual Symptoms    GI/Hepatic CBD stone, high LFTs   Endo/Other  Hypothyroidism   Renal/GU ARF and Renal InsufficiencyRenal disease     Musculoskeletal  (+) Arthritis ,   Abdominal   Peds  Hematology  (+) anemia ,   Anesthesia Other Findings   Reproductive/Obstetrics                             Anesthesia Physical Anesthesia Plan  ASA: III  Anesthesia Plan: General   Post-op Pain Management:    Induction: Intravenous  PONV Risk Score and Plan: 3  Airway Management Planned: Oral ETT  Additional Equipment:   Intra-op Plan:   Post-operative Plan: Extubation in OR  Informed Consent: I have reviewed the patients History and Physical, chart, labs and discussed the procedure including the risks, benefits and alternatives for the proposed anesthesia with the patient or authorized representative who has indicated his/her understanding and acceptance.     Dental advisory given  Plan Discussed with: Surgeon  Anesthesia Plan Comments:         Anesthesia Quick Evaluation

## 2020-03-03 NOTE — Progress Notes (Signed)
Brief ERCP note.  Large periampullary diverticulum. Dilated CBD and CHD measuring 16 to 18 mm diameter. Large dates shaped stone and common hepatic duct measuring over 20 mm in length. Biliary sphincterotomy performed. Stone fragments removed following mechanical lithotripsy with Dormia basket and stone balloon extractor but all of the fragments could not be removed. 10 French 7 cm long plastic stent placed for biliary decompression.  Patient tolerated the procedure well.

## 2020-03-03 NOTE — Transfer of Care (Signed)
Immediate Anesthesia Transfer of Care Note  Patient: Jordan Ward  Procedure(s) Performed: ENDOSCOPIC RETROGRADE CHOLANGIOPANCREATOGRAPHY (ERCP) (N/A )  Patient Location: PACU  Anesthesia Type:General  Level of Consciousness: awake, alert , oriented and sedated  Airway & Oxygen Therapy: Patient Spontanous Breathing  Post-op Assessment: Report given to RN and Post -op Vital signs reviewed and stable  Post vital signs: Reviewed and stable  Last Vitals:  Vitals Value Taken Time  BP 133/87 03/03/20 1130  Temp 36.4 C 03/03/20 1122  Pulse 69 03/03/20 1135  Resp 17 03/03/20 1135  SpO2 98 % 03/03/20 1135  Vitals shown include unvalidated device data.  Last Pain:  Vitals:   03/03/20 1130  TempSrc:   PainSc: 5       Patients Stated Pain Goal: 6 (16/96/78 9381)  Complications: No apparent anesthesia complications

## 2020-03-03 NOTE — Op Note (Signed)
[facility] Patient Name: Jordan Ward Procedure Date: 03/03/2020 9:47 AM MRN: [id number] Date of Birth: 1941/03/19 Attending MD: Hildred Laser , MD CSN: [id number] Age: 79 Admit Type: Inpatient Procedure:                ERCP Indications:              Bile duct stone(s) Providers:                Hildred Laser, MD, Lurline Del, RN, Randa Spike,                            Technician Referring MD:             Myrene Buddy, MD Medicines:                General Anesthesia Complications:            No immediate complications. Estimated Blood Loss:     Estimated blood loss was minimal. Procedure:                Pre-Anesthesia Assessment:                           - Prior to the procedure, a History and Physical                            was performed, and patient medications and                            allergies were reviewed. The patient's tolerance of                            previous anesthesia was also reviewed. The risks                            and benefits of the procedure and the sedation                            options and risks were discussed with the patient.                            All questions were answered, and informed consent                            was obtained. Prior Anticoagulants: The patient has                            taken no previous anticoagulant or antiplatelet                            agents. ASA Grade Assessment: III - A patient with                            severe systemic disease. After reviewing the risks  and benefits, the patient was deemed in                            satisfactory condition to undergo the procedure.                           After obtaining informed consent, the scope was                            passed under direct vision. Throughout the                            procedure, the patient's blood pressure, pulse, and                            oxygen saturations were monitored  continuously. The                            [id number]-Q180V ([id number]) scope was introduced through                            the mouth, and used to inject contrast into and                            used to inject contrast into the bile duct. The                            ERCP was somewhat difficult due to large calcified                            stone. The patient tolerated the procedure well. Scope In: 10:26:58 AM Scope Out: 46:56:81 AM Total Procedure Duration: 0 hours 39 minutes 11 seconds  Findings:      The scout film was normal. The esophagus was successfully intubated       under direct vision. The scope was advanced to a normal major papilla in       the descending duodenum without detailed examination of the pharynx,       larynx and associated structures, and upper GI tract. The upper GI tract       was grossly normal. The bile duct was deeply cannulated with the       traction (standard) sphincterotome and Hydratome sphincterotome.       Contrast was injected. I personally interpreted the bile duct images.       There was brisk flow of contrast through the ducts. Image quality was       excellent. Contrast extended to the main bile duct. Contrast extended to       the bifurcation. Contrast extended to the hepatic ducts. The common bile       duct and common hepatic duct were severely dilated, with a stone causing       an obstruction. The largest diameter was 18 mm. A 10 mm biliary       sphincterotomy was made with a braided Hydratome sphincterotome using       ERBE electrocautery. The sphincterotomy oozed blood. The biliary tree  was swept with a lithotriptor starting at the bifurcation. Many       fragments were removed. A few stone fragments remained. One 10 Fr by 7       cm plastic stent with a single external flap and a single internal flap       was placed 6 cm into the common bile duct. Bile flowed through the       stent. The stent was in good  position. Impression:               - Large duodenal diverticulum. Ampulla located just                            outside the Cermak at left lower corner.                           - The common bile duct and common hepatic duct were                            severely dilated, with a stone causing an                            obstruction.                           - Choledocholithiasis was found. Partial removal                            was accomplished by biliary sphincterotomy; no                            stent was inserted.                           - A biliary sphincterotomy was performed.                           - The biliary tree was swept.                           - !0 Fr. 7 cm biliary platic stent placed since all                            fragments could not be removed. Moderate Sedation:      Per Anesthesia Care Recommendation:           - Clear liquids today.                           - No NSAIDs or anticoagulants for three days.                           - Continue usual medications.                           - Lab in am.                           -  Repeat ERCP in 6 to 8 weeks.                           comments: only two images saved by radiology tech. Procedure Code(s):        --- Professional ---                           [phone], Endoscopic retrograde                            cholangiopancreatography (ERCP); with placement of                            endoscopic stent into biliary or pancreatic duct,                            including pre- and post-dilation and guide wire                            passage, when performed, including sphincterotomy,                            when performed, each stent                           [id number], Endoscopic retrograde                            cholangiopancreatography (ERCP); with removal of                            calculi/debris from biliary/pancreatic duct(s) Diagnosis Code(s):        --- Professional ---                            K80.51, Calculus of bile duct without cholangitis                            or cholecystitis with obstruction CPT copyright 2019 American Medical Association. All rights reserved. The codes documented in this report are preliminary and upon coder review may  be revised to meet current compliance requirements. Hildred Laser, MD Hildred Laser, MD 03/03/2020 11:40:23 AM This report has been signed electronically. Number of Addenda: 0

## 2020-03-03 NOTE — Progress Notes (Addendum)
PROGRESS NOTE    JUD FANGUY  OVF:[id number] DOB: [birth year] DOA: 03/01/2020 PCP: [name], MD   Brief Narrative: Mr. Ragle is a 79 y.o. M with multiple sclerosis with neurogenic bladder, SP catheter in place, history of colectomy for adenoma, large ventral hernia, HTN, hypothyroidism, CKD stage III history of C. difficile, history of stroke, pulmonary fibrosis, sleep apnea who presented with vomiting for 1 day  Patient was in his usual state of health until around 3 days prior to admission started to have abdominal cramps, nausea and vomiting.  Also developed constipation without BM.  He went to see his general surgeon, who noticed he had blood pressure 70/40 and sent him to the ER.  Assessment & Plan:   Principal Problem:   Urinary tract infection Active Problems:   Pulmonary fibrosis (HCC)   Hypothyroidism   Partial small bowel obstruction (HCC)   Acute kidney injury superimposed on CKD (HCC)   Dehydration   Nausea and vomiting   #1 choledocholithiasis status post ERCP dilated CBD and CHD 16 to 18 mm in diameter status post biliary sphincterotomy.  Plastic stent placed for biliary decompression.  #2 UTI in the setting of chronic indwelling suprapubic catheter present on admission.  Patient presented with abdominal pain nausea vomiting and pus around the site of suprapubic catheter.  Follow urine culture.  Continue Rocephin for now. Urine culture with more than 100,000 colonies of Morganella morganii sensitivities pending. Suprapubic catheter to be changed today.  #3 possible viral gastroenteritis with nausea and vomiting has been resolved this could also likely be due to UTI  #4 AKI on CKD stage IIIb-exacerbated by hypotension. Creatinine on admission 2.48 down to 1.30.  #4 hypothyroidism continue Synthroid.  #4 sleep apnea on CPAP at night  #5 history of chronic anemia thrombocytopenia stable  #6 generalized weakness and deconditioning PT consult out of bed ambulate as  tolerated.  Pressure Ulcer 08/03/15 Stage I -  Intact skin with non-blanchable redness of a localized area usually over a bony prominence. Tip of left great toe red (Active)  08/03/15 1425  Location: Toe (Comment  which one)  Location Orientation: Left  Staging: Stage I -  Intact skin with non-blanchable redness of a localized area usually over a bony prominence.  Wound Description (Comments): Tip of left great toe red  Present on Admission:      Pressure Injury 05/26/17 Stage I -  Intact skin with non-blanchable redness of a localized area usually over a bony prominence.  calleous and non-blanchable redness to ankle (Active)  05/26/17 0157  Location: Ankle  Location Orientation: Right  Staging: Stage I -  Intact skin with non-blanchable redness of a localized area usually over a bony prominence.  Wound Description (Comments):  calleous and non-blanchable redness to ankle  Present on Admission: Yes    Estimated body mass index is 19.63 kg/m as calculated from the following:   Height as of this encounter: 5\' 5"  (1.651 m).   Weight as of this encounter: 53.5 kg.  DVT prophylaxis: SCD Code Status: Full code Family Communication: Discussed with patient Disposition Plan:  Status is: Inpatient   Dispo: The patient is from: Home              Anticipated d/c is to: Home              Anticipated d/c date is: Unknown              Patient currently is not medically stable  to d/c. Consultants: GI Procedures: ERCP Antimicrobials Rocephin Subjective:  He is resting in bed continues to complain of abdominal pain nausea and vomiting has resolved Remains on IV fluids and is n.p.o. Objective: Vitals:   03/03/20 0937 03/03/20 1122 03/03/20 1130 03/03/20 1145  BP: (!) 151/84 (!) 147/89 133/87 (!) 161/78  Pulse: (!) 58 72 73 67  Resp: 15 16 15 18   Temp: 99 F (37.2 C) 97.6 F (36.4 C)    TempSrc: Oral     SpO2: 100% 99% 97% 100%  Weight:      Height:        Intake/Output Summary  (Last 24 hours) at 03/03/2020 1304 Last data filed at 03/03/2020 1119 Gross per 24 hour  Intake 2505.85 ml  Output 1700 ml  Net 805.85 ml   Filed Weights   03/03/20 0534  Weight: 53.5 kg    Examination:  General exam: Appears calm and comfortable  Respiratory system: Clear to auscultation. Respiratory effort normal. Cardiovascular system: S1 & S2 heard, RRR. No JVD, murmurs, rubs, gallops or clicks. No pedal edema. Gastrointestinal system: Abdomen is nondistended, soft and nontender. No organomegaly or masses felt. Normal bowel sounds heard.  Suprapubic catheter in place Central nervous system: Alert and oriented. No focal neurological deficits. Extremities: Symmetric 5 x 5 power. Skin: No rashes, lesions or ulcers Psychiatry: Judgement and insight appear normal. Mood & affect appropriate.     Data Reviewed: I have personally reviewed following labs and imaging studies  CBC: Recent Labs  Lab 03/01/20 1730 03/02/20 0338 03/03/20 0536  WBC 7.9 5.8 5.4  NEUTROABS 5.0  --  2.5  HGB 12.7* 11.0* 11.5*  HCT 40.4 35.2* 36.2*  MCV 98.3 99.2 99.2  PLT 128* 117* 003*   Basic Metabolic Panel: Recent Labs  Lab 03/01/20 1730 03/02/20 0338 03/03/20 0536  NA 136 139 141  K 5.1 4.5 3.8  CL 102 108 107  CO2 26 23 27   GLUCOSE 109* 87 97  BUN 49* 43* 25*  CREATININE 2.48* 1.94* 1.30*  CALCIUM 8.9 7.9* 8.2*   GFR: Estimated Creatinine Clearance: 36.6 mL/min (A) (by C-G formula based on SCr of 1.3 mg/dL (H)). Liver Function Tests: Recent Labs  Lab 03/01/20 1730 03/02/20 0338 03/03/20 0536  AST 221* 100* 35  ALT 195* 122* 76*  ALKPHOS 226* 171* 144*  BILITOT 1.6* 1.0 0.4  PROT 7.1 5.8* 5.6*  ALBUMIN 3.8 3.1* 3.0*   No results for input(s): LIPASE, AMYLASE in the last 168 hours. No results for input(s): AMMONIA in the last 168 hours. Coagulation Profile: Recent Labs  Lab 03/03/20 0536  INR 1.1   Cardiac Enzymes: No results for input(s): CKTOTAL, CKMB, CKMBINDEX,  TROPONINI in the last 168 hours. BNP (last 3 results) No results for input(s): PROBNP in the last 8760 hours. HbA1C: No results for input(s): HGBA1C in the last 72 hours. CBG: No results for input(s): GLUCAP in the last 168 hours. Lipid Profile: No results for input(s): CHOL, HDL, LDLCALC, TRIG, CHOLHDL, LDLDIRECT in the last 72 hours. Thyroid Function Tests: No results for input(s): TSH, T4TOTAL, FREET4, T3FREE, THYROIDAB in the last 72 hours. Anemia Panel: No results for input(s): VITAMINB12, FOLATE, FERRITIN, TIBC, IRON, RETICCTPCT in the last 72 hours. Sepsis Labs: Recent Labs  Lab 03/01/20 1804 03/02/20 1038  LATICACIDVEN 2.1* 1.2    Recent Results (from the past 240 hour(s))  Urine culture     Status: Abnormal (Preliminary result)   Collection Time: 03/01/20  6:04 PM  Specimen: Urine, Suprapubic  Result Value Ref Range Status   Specimen Description   Final    URINE, SUPRAPUBIC Performed at [facility], [address] [place], [place] [address]    Special Requests   Final    NONE Performed at [facility], [address] [place], [place] [address]    Culture (A)  Final    >=100,000 COLONIES/mL MORGANELLA MORGANII SUSCEPTIBILITIES TO FOLLOW Performed at [facility], [place] [address], [place], [place] [address]    Report Status PENDING  Incomplete  Culture, blood (routine x 2)     Status: None (Preliminary result)   Collection Time: 03/01/20  9:54 PM   Specimen: BLOOD LEFT FOREARM  Result Value Ref Range Status   Specimen Description BLOOD LEFT FOREARM  Final   Special Requests   Final    BOTTLES DRAWN AEROBIC AND ANAEROBIC Blood Culture adequate volume   Culture   Final    NO GROWTH 2 DAYS Performed at [facility], [address], [place], [place] [address]    Report Status PENDING  Incomplete  Culture, blood (routine x 2)     Status: None (Preliminary result)   Collection Time: 03/01/20  9:54 PM   Specimen: Left Antecubital; Blood  Result Value Ref  Range Status   Specimen Description LEFT ANTECUBITAL  Final   Special Requests   Final    BOTTLES DRAWN AEROBIC AND ANAEROBIC Blood Culture adequate volume   Culture   Final    NO GROWTH 2 DAYS Performed at [facility], [address], [place], [place] [address]    Report Status PENDING  Incomplete  SARS Coronavirus 2 by RT PCR (hospital order, performed in [facility] hospital lab) Nasopharyngeal Nasopharyngeal Swab     Status: None   Collection Time: 03/02/20 10:18 AM   Specimen: Nasopharyngeal Swab  Result Value Ref Range Status   SARS Coronavirus 2 NEGATIVE NEGATIVE Final    Comment: (NOTE) SARS-CoV-2 target nucleic acids are NOT DETECTED. The SARS-CoV-2 RNA is generally detectable in upper and lower respiratory specimens during the acute phase of infection. The lowest concentration of SARS-CoV-2 viral copies this assay can detect is 250 copies / mL. A negative result does not preclude SARS-CoV-2 infection and should not be used as the sole basis for treatment or other patient management decisions.  A negative result may occur with improper specimen collection / handling, submission of specimen other than nasopharyngeal swab, presence of viral mutation(s) within the areas targeted by this assay, and inadequate number of viral copies (<250 copies / mL). A negative result must be combined with clinical observations, patient history, and epidemiological information. Fact Sheet for Patients:   StrictlyIdeas.no Fact Sheet for Healthcare Providers: BankingDealers.co.za This test is not yet approved or cleared  by the [place] FDA and has been authorized for detection and/or diagnosis of SARS-CoV-2 by FDA under an Emergency Use Authorization (EUA).  This EUA will remain in effect (meaning this test can be used) for the duration of the COVID-19 declaration under Section 564(b)(1) of the Act, 21 U.S.C. section 360bbb-3(b)(1), unless  the authorization is terminated or revoked sooner. Performed at [facility], [address] [place], [place] [address]          Radiology Studies: CT ABDOMEN PELVIS WO CONTRAST  Addendum Date: 03/02/2020   ADDENDUM REPORT: 03/02/2020 17:15 ADDENDUM: It should be noted that there is mild fecalization of bowel contents within some of the small bowel loops of the rectus diastasis.  This may represent delayed transit through the small bowel although no true obstructive changes are seen. Electronically Signed   By: Inez Catalina M.D.   On: 03/02/2020 17:15   Result Date: 03/02/2020 CLINICAL DATA:  Acute abdominal pain and neutropenia EXAM: CT ABDOMEN AND PELVIS WITHOUT CONTRAST TECHNIQUE: Multidetector CT imaging of the abdomen and pelvis was performed following the standard protocol without IV contrast. COMPARISON:  08/26/2019 FINDINGS: Lower chest: Emphysematous changes with chronic scarring are identified. Hepatobiliary: Gallbladder has been surgically removed. No focal hepatic abnormality is seen. The common bile duct is significantly dilated extending to a chronic distal common bile duct stone unchanged from the prior study. Pancreas: Unremarkable. No pancreatic ductal dilatation or surrounding inflammatory changes. Spleen: Normal in size without focal abnormality. Adrenals/Urinary Tract: Adrenal glands are within normal limits. The right kidney is atrophic with scattered nonobstructing renal stones. The largest of these lies in the lower pole measuring 6.6 mm. Cystic changes are noted bilaterally. Stable nonobstructing renal calculi are noted on the left. Fullness of the left collecting system is seen. No obstructing calculus is noted. Bladder is decompressed by suprapubic catheter. There is a calcification identified to the right of the midline which appears within the bladder consistent with a previously passed calculus. This may be related to the current symptomatology. Stomach/Bowel: Postsurgical  changes are noted in the colon. The appendix has been surgically removed. Fluid-filled loops of small bowel are noted. Diastasis of the rectus muscles is noted with herniation of bowel loops identified. This is stable in appearance from the prior exam. Vascular/Lymphatic: Aortic atherosclerosis. No enlarged abdominal or pelvic lymph nodes. Reproductive: Prostate is unremarkable. Other: No abdominal Wight hernia or abnormality. No abdominopelvic ascites. Musculoskeletal: Degenerative changes of lumbar spine are noted. IMPRESSION: Bilateral nonobstructing renal calculi as described relatively stable from the prior exam. Calculus within a decompressed bladder consistent with previously passed stone. This was not present on the prior exam. This may have contributed to the patient's clinical symptomatology. Stable rectus diastasis with multiple bowel loops within. Stable distal common bile duct stone with significant biliary ductal dilatation. Electronically Signed: By: Inez Catalina M.D. On: 03/01/2020 19:25        Scheduled Meds: . [MAR Hold] carvedilol  6.25 mg Oral BID WC  . [MAR Hold] diazepam  5 mg Oral BID  . [MAR Hold] feeding supplement (ENSURE ENLIVE)  237 mL Oral BID BM  . [MAR Hold] levothyroxine  75 mcg Oral Q0600  . [MAR Hold] verapamil  40 mg Oral BID   Continuous Infusions: . sodium chloride 100 mL/hr at 03/03/20 0616  . [MAR Hold] cefTRIAXone (ROCEPHIN)  IV 1 g (03/02/20 2208)     LOS: 1 day     Georgette Shell, MD  03/03/2020, 1:04 PM

## 2020-03-03 NOTE — Anesthesia Procedure Notes (Signed)
Procedure Name: Intubation Date/Time: 03/03/2020 10:08 AM Performed by: Denese Killings, MD Pre-anesthesia Checklist: Patient identified, Emergency Drugs available, Suction available and Patient being monitored Patient Re-evaluated:Patient Re-evaluated prior to induction Preoxygenation: Pre-oxygenation with 100% oxygen Induction Type: IV induction Ventilation: Mask ventilation without difficulty Grade View: Grade I Tube type: Oral Number of attempts: 1 Airway Equipment and Method: Stylet and Oral airway Placement Confirmation: ETT inserted through vocal cords under direct vision,  positive ETCO2 and breath sounds checked- equal and bilateral Secured at: 25 cm Tube secured with: Tape Dental Injury: Teeth and Oropharynx as per pre-operative assessment

## 2020-03-03 NOTE — Progress Notes (Signed)
Initial Nutrition Assessment  DOCUMENTATION CODES:   Not applicable  INTERVENTION:  Continue Ensure Enlive po BID, each supplement provides 350 kcal and 20 grams of protein Boost Breeze po daily, each supplement provides 250 kcal and 9 grams of protein MVI with minerals daily   NUTRITION DIAGNOSIS:   Inadequate oral intake related to acute illness(UTI secondary to suprapubic catheter; choledocholithiasis) as evidenced by meal completion < 25%(per chart, patient reports nausea, vomiting, and decreased po intake).    GOAL:   Patient will meet greater than or equal to 90% of their needs    MONITOR:   Labs, I & O's, Supplement acceptance, PO intake, Weight trends  REASON FOR ASSESSMENT:   Malnutrition Screening Tool    ASSESSMENT:  RD working remotely.  79 year old male admitted for UTI with past medical history significant of HTN, pulmonary fibrosis, CKD, CAD, chronic back pain, suprapubic catheter, presented with severe abdominal pain with nausea and vomiting, decreased po intake, and weakness.  Patient is s/p therapeutic ERCP with biliary sphincterotomy and biliary stenting secondary to choledocholithiasis.   Diet advanced from clears to heart healthy today at 1120, no documented meals for review since diet advancement, noted 0% x 2 clear liquid meals. Patient is provided Ensure twice daily per medication review, will also provide Boost Breeze daily to aid with meeting needs.   Per notes: -large hernia to RUQ -UTI secondary to gram-negative organisms, ID pending  -follow urine culture -suprapubic catheter to be changed today -resolved gastroenteritis  Current wt 117.7 lb No recent weight history available for review, patient weighed 119.68 lb on 08/22/19. Weights stable 132-136 lb in 2018-2019.  I/Os: +3600 ml since admit    +1700 ml x 24 hrs UOP: 1000 ml x 24 hrs Medications reviewed and include: Valium, Calan IVF: NaCl IVPB: Rocephin Labs  reviewed  NUTRITION - FOCUSED PHYSICAL EXAM: Unable to complete at this time, RD working remotely.  Diet Order:   Diet Order            Diet Heart Room service appropriate? Yes; Fluid consistency: Thin  Diet effective 0500        Diet clear liquid Room service appropriate? Yes; Fluid consistency: Thin  Diet effective now              EDUCATION NEEDS:   No education needs have been identified at this time  Skin:  Skin Assessment: Reviewed RN Assessment  Last BM:  6/5  Height:   Ht Readings from Last 1 Encounters:  03/03/20 5\' 5"  (1.651 m)    Weight:   Wt Readings from Last 1 Encounters:  03/03/20 53.5 kg    BMI:  Body mass index is 19.63 kg/m.  Estimated Nutritional Needs:   Kcal:  1941-7408  Protein:  70-80  Fluid:  >/= 1.6 L/day   Lajuan Lines, RD, LDN Clinical Nutrition After Hours/Weekend Pager # in [phone]

## 2020-03-03 NOTE — Anesthesia Postprocedure Evaluation (Signed)
Anesthesia Post Note  Patient: Jordan Ward  Procedure(s) Performed: ENDOSCOPIC RETROGRADE CHOLANGIOPANCREATOGRAPHY (ERCP) (N/A )  Patient location during evaluation: PACU Anesthesia Type: General Level of consciousness: awake and alert, oriented and sedated Pain management: pain level controlled Vital Signs Assessment: post-procedure vital signs reviewed and stable Respiratory status: spontaneous breathing and respiratory function stable Cardiovascular status: blood pressure returned to baseline and stable Postop Assessment: no apparent nausea or vomiting Anesthetic complications: no     Last Vitals:  Vitals:   03/03/20 1122 03/03/20 1130  BP: (!) 147/89 133/87  Pulse: 72 73  Resp: 16 15  Temp: 36.4 C   SpO2: 99% 97%    Last Pain:  Vitals:   03/03/20 1130  TempSrc:   PainSc: 5                  Diya Gervasi C Hisayo Delossantos

## 2020-03-04 DIAGNOSIS — K439 Ventral hernia without obstruction or gangrene: Secondary | ICD-10-CM

## 2020-03-04 LAB — CBC
HCT: 36.1 % — ABNORMAL LOW (ref 39.0–52.0)
Hemoglobin: 11.9 g/dL — ABNORMAL LOW (ref 13.0–17.0)
MCH: 31.8 pg (ref 26.0–34.0)
MCHC: 33 g/dL (ref 30.0–36.0)
MCV: 96.5 fL (ref 80.0–100.0)
Platelets: 130 10*3/uL — ABNORMAL LOW (ref 150–400)
RBC: 3.74 MIL/uL — ABNORMAL LOW (ref 4.22–5.81)
RDW: 13.3 % (ref 11.5–15.5)
WBC: 5.5 10*3/uL (ref 4.0–10.5)
nRBC: 0 % (ref 0.0–0.2)

## 2020-03-04 LAB — AMYLASE: Amylase: 50 U/L (ref 28–100)

## 2020-03-04 LAB — URINE CULTURE: Culture: >=100000 — AB

## 2020-03-04 LAB — HEPATIC FUNCTION PANEL
ALT: 58 U/L — ABNORMAL HIGH (ref 0–44)
AST: 23 U/L (ref 15–41)
Albumin: 3.2 g/dL — ABNORMAL LOW (ref 3.5–5.0)
Alkaline Phosphatase: 132 U/L — ABNORMAL HIGH (ref 38–126)
Bilirubin, Direct: 0.1 mg/dL (ref 0.0–0.2)
Indirect Bilirubin: 0.5 mg/dL (ref 0.3–0.9)
Total Bilirubin: 0.6 mg/dL (ref 0.3–1.2)
Total Protein: 6 g/dL — ABNORMAL LOW (ref 6.5–8.1)

## 2020-03-04 MED ORDER — CIPROFLOXACIN HCL 500 MG PO TABS
500.0000 mg | ORAL_TABLET | Freq: Two times a day (BID) | ORAL | 0 refills | Status: DC
Start: 2020-03-04 — End: 2020-03-04

## 2020-03-04 MED ORDER — ACETAMINOPHEN 325 MG PO TABS: 650.0000 mg | ORAL_TABLET | Freq: Four times a day (QID) | ORAL | Status: DC | PRN

## 2020-03-04 MED ORDER — SULFAMETHOXAZOLE-TRIMETHOPRIM 800-160 MG PO TABS
1.0000 | ORAL_TABLET | Freq: Two times a day (BID) | ORAL | 0 refills | Status: DC
Start: 2020-03-04 — End: 2020-03-04

## 2020-03-04 MED ORDER — AMOXICILLIN-POT CLAVULANATE 875-125 MG PO TABS
1.0000 | ORAL_TABLET | Freq: Two times a day (BID) | ORAL | 0 refills | Status: AC
Start: 2020-03-04 — End: 2020-03-09

## 2020-03-04 NOTE — Progress Notes (Signed)
[facility] Progress Note  1 Day Post-Op  Subjective: Has had Bms and tolerated diet. Had ERCP yesterday for stone that was chronically in the CBD. Feeling better overall. Had his suprapubic changed too per report.   Objective: Vital signs in last 24 hours: Temp:  [97.5 F (36.4 C)-98.8 F (37.1 C)] 97.5 F (36.4 C) (06/06 0258) Pulse Rate:  [46-73] 46 (06/06 0258) Resp:  [15-18] 18 (06/06 0258) BP: (108-185)/(63-89) 133/66 (06/06 0258) SpO2:  [96 %-100 %] 99 % (06/06 0258) Last BM Date: 03/03/20  Intake/Output from previous day: 06/05 0701 - 06/06 0700 In: 1521.6 [P.O.:360; I.V.:1161.6] Out: 1750 [Urine:1750] Intake/Output this shift: Total I/O In: 240 [P.O.:240] Out: -   General appearance: alert, cooperative and no distress Resp: normal work of breathing GI: soft, nondistended, larger reducible hernia, minor tenderness   Lab Results:  Recent Labs    03/03/20 0536 03/04/20 0410  WBC 5.4 5.5  HGB 11.5* 11.9*  HCT 36.2* 36.1*  PLT 123* 130*   BMET Recent Labs    03/02/20 0338 03/03/20 0536  NA 139 141  K 4.5 3.8  CL 108 107  CO2 23 27  GLUCOSE 87 97  BUN 43* 25*  CREATININE 1.94* 1.30*  CALCIUM 7.9* 8.2*   PT/INR Recent Labs    03/03/20 0536  LABPROT 13.6  INR 1.1    Studies/Results: DG ERCP  Result Date: 03/04/2020 CLINICAL DATA:  Choledocholithiasis EXAM: ERCP TECHNIQUE: Multiple spot images obtained with the fluoroscopic device and submitted for interpretation post-procedure. COMPARISON:  CT 03/01/2020 FINDINGS: Series of fluoroscopic spot images document endoscopic cannulation and opacification of the CBD with placement of plastic biliary stent. CBD is dilated. There is incomplete opacification and visualization of the intrahepatic biliary tree which appears mildly ectatic centrally. Cholecystectomy clips. No evidence of extravasation. IMPRESSION: Endoscopic CBD cannulation and intervention with stent placement. These images were  submitted for radiologic interpretation only. Please see the procedural report for the amount of contrast and the fluoroscopy time utilized. Electronically Signed   By: Lucrezia Europe M.D.   On: 03/04/2020 08:22    Anti-infectives: Anti-infectives (From admission, onward)   Start     Dose/Rate Route Frequency Ordered Stop   03/01/20 2145  cefTRIAXone (ROCEPHIN) 1 g in sodium chloride 0.9 % 100 mL IVPB     1 g 200 mL/hr over 30 Minutes Intravenous Every 24 hours 03/01/20 2136        Assessment/Plan: Jordan Ward is a 79 yo with multiple medical issues UTI with suprapubic, chronically dilated CBD s/p ERCP to remove stone, large ventral hernia that is too large to repair without separation of components and patient does not qualify for repair of this time given comorbidities.  Hernia also too large to obstruct or cause strangulation.   -Likely dc home soon, tolerating diet -Asked for leg bag for suprapubic catheter  -PRN follow up with me, no plan for hernia repair  Updated Dr. Zigmund Daniel and RN.    LOS: 2 days    Virl Cagey 03/04/2020

## 2020-03-04 NOTE — Progress Notes (Addendum)
  Subjective:  Patient says he feels much better.  He ate most of his breakfast.  He denies epigastric or lower abdominal pain.  Current Medications: Scheduled Meds: . carvedilol  6.25 mg Oral BID WC  . diazepam  5 mg Oral BID  . feeding supplement  1 Container Oral Q24H  . feeding supplement (ENSURE ENLIVE)  237 mL Oral BID BM  . levothyroxine  75 mcg Oral Q0600  . multivitamin with minerals  1 tablet Oral Daily  . verapamil  40 mg Oral BID   Continuous Infusions: . sodium chloride 50 mL/hr at 03/03/20 1635  . cefTRIAXone (ROCEPHIN)  IV 1 g (03/03/20 2106)    Objective: Blood pressure 133/66, pulse (!) 46, temperature (!) 97.5 F (36.4 C), temperature source Oral, resp. rate 18, height '5\' 5"'$  (1.651 m), weight 53.5 kg, SpO2 99 %. Patient is alert and in no acute distress. Abdominal examination reveals large ventral hernia mid abdomen which is partially reducible and not tender.  He has suprapubic catheter/Foley's in place. No LE edema noted. He has mild clubbing.  Labs/studies Results:  CBC Latest Ref Rng & Units 03/04/2020 03/03/2020 03/02/2020  WBC 4.0 - 10.5 K/uL 5.5 5.4 5.8  Hemoglobin 13.0 - 17.0 g/dL 11.9(L) 11.5(L) 11.0(L)  Hematocrit 39.0 - 52.0 % 36.1(L) 36.2(L) 35.2(L)  Platelets 150 - 400 K/uL 130(L) 123(L) 117(L)    CMP Latest Ref Rng & Units 03/04/2020 03/03/2020 03/02/2020  Glucose 70 - 99 mg/dL - 97 87  BUN 8 - 23 mg/dL - 25(H) 43(H)  Creatinine 0.61 - 1.24 mg/dL - 1.30(H) 1.94(H)  Sodium 135 - 145 mmol/L - 141 139  Potassium 3.5 - 5.1 mmol/L - 3.8 4.5  Chloride 98 - 111 mmol/L - 107 108  CO2 22 - 32 mmol/L - 27 23  Calcium 8.9 - 10.3 mg/dL - 8.2(L) 7.9(L)  Total Protein 6.5 - 8.1 g/dL 6.0(L) 5.6(L) 5.8(L)  Total Bilirubin 0.3 - 1.2 mg/dL 0.6 0.4 1.0  Alkaline Phos 38 - 126 U/L 132(H) 144(H) 171(H)  AST 15 - 41 U/L 23 35 100(H)  ALT 0 - 44 U/L 58(H) 76(H) 122(H)    Hepatic Function Latest Ref Rng & Units 03/04/2020 03/03/2020 03/02/2020  Total Protein 6.5 - 8.1 g/dL  6.0(L) 5.6(L) 5.8(L)  Albumin 3.5 - 5.0 g/dL 3.2(L) 3.0(L) 3.1(L)  AST 15 - 41 U/L 23 35 100(H)  ALT 0 - 44 U/L 58(H) 76(H) 122(H)  Alk Phosphatase 38 - 126 U/L 132(H) 144(H) 171(H)  Total Bilirubin 0.3 - 1.2 mg/dL 0.6 0.4 1.0  Bilirubin, Direct 0.0 - 0.2 mg/dL 0.1 0.1 0.3(H)    Serum amylase 50.  Urine culture positive for Morganella morganii.  Cultures remain negative.  Assessment:  #1.  Choledocholithiasis.  Patient has had intermittent symptoms for the last few years.  Work-up revealed 1 large calcified stone.  Patient underwent ERCP with sphincterotomy yesterday.  Mechanical lithotripsy performed with removal of multiple fragments with some fragments could not be removed.  Therefore plastic stent left in place.  Bilirubin is normal.  Transaminases are trending down.  No evidence of post ERCP pancreatitis.  #2.  Urinary tract infection secondary to Morganella morganii sensitive to ceftriaxone as well as Cipro and Bactrim.  #3.  Anemia appears to be chronic disease.  Recommendations  We will plan to see patient in the office in 1 month and arrange for repeat ERCP in 6 to 8 weeks to remove the stent in remains stone fragments.

## 2020-03-04 NOTE — Discharge Summary (Addendum)
Physician Discharge Summary  Jordan Ward WIO:[id number] DOB: Jun 29, 1941 DOA: 03/01/2020  PCP: [name], MD  Admit date: 03/01/2020 Discharge date: 03/04/2020  Admitted From:home Disposition:  home Recommendations for Outpatient Follow-up:  1. Follow up with PCP in 1-2 weeks 2. Please obtain BMP/CBC this week. 3. Please follow up with gi/general surgery  Home Health:none Equipment/Devices:none Discharge Condition:stable CODE STATUS full Diet recommendation: cardiac  Brief/Interim Summary:Mr. Wallis a 79 y.o.Mwith multiple sclerosis with neurogenic bladder, SP catheter in place,history of colectomy for adenoma, large ventral hernia,HTN, hypothyroidism, CKD stage III history of C. difficile, history of stroke, pulmonary fibrosis, sleep apneawho presented with vomiting for 1 day  Patient was in his usual state of health until around 3 days prior to admission started to have abdominal cramps, nausea and vomiting. Also developed constipation without BM. He went to see his general surgeon, who noticed he had blood pressure 70/40 and sent him to the ER  Discharge Diagnoses:  Principal Problem:   Urinary tract infection Active Problems:   Pulmonary fibrosis (HCC)   Hypothyroidism   Partial small bowel obstruction (HCC)   Acute kidney injury superimposed on CKD (HCC)   Dehydration   Nausea and vomiting  #1 choledocholithiasis status post ERCP dilated CBD and CHD 16 to 18 mm in diameter status post biliary sphincterotomy.  Plastic stent placed for biliary decompression.LFTs trending down.  #2 UTI in the setting of chronic indwelling suprapubic catheter present on admission.  Patient presented with abdominal pain nausea vomiting and pus around the site of suprapubic catheter. Urine culture with more than 100,000 colonies of Morganella morganii sensitive to cipro and Bactrim.  Patient is allergic to Cipro.  Will DC patient on Bactrim for 5 days.  Since he already has stage III CKD I  have put him on Augmentin for 5 days.    #3 possible viral gastroenteritis with nausea and vomiting has been resolved this could also likely be due to UTI  #4 AKI on CKD stage IIIb-exacerbated by hypotension. Creatinine on admission 2.48 down to 1.30.  #4 hypothyroidism continue Synthroid.  #4 sleep apnea on CPAP at night  #5 history of chronic anemia thrombocytopenia stable   Pressure Ulcer 08/03/15 Stage I -  Intact skin with non-blanchable redness of a localized area usually over a bony prominence. Tip of left great toe red (Active)  08/03/15 1425  Location: Toe (Comment  which one)  Location Orientation: Left  Staging: Stage I -  Intact skin with non-blanchable redness of a localized area usually over a bony prominence.  Wound Description (Comments): Tip of left great toe red  Present on Admission:      Pressure Injury 05/26/17 Stage I -  Intact skin with non-blanchable redness of a localized area usually over a bony prominence.  calleous and non-blanchable redness to ankle (Active)  05/26/17 0157  Location: Ankle  Location Orientation: Right  Staging: Stage I -  Intact skin with non-blanchable redness of a localized area usually over a bony prominence.  Wound Description (Comments):  calleous and non-blanchable redness to ankle  Present on Admission: Yes      Nutrition Problem: Inadequate oral intake Etiology: acute illness(UTI secondary to suprapubic catheter; choledocholithiasis)    Signs/Symptoms: meal completion < 25%(per chart, patient reports nausea, vomiting, and decreased po intake)     Interventions: Ensure Enlive (each supplement provides 350kcal and 20 grams of protein), Boost Breeze, MVI  Estimated body mass index is 19.63 kg/m as calculated from the following:  Height as of this encounter: 5\' 5"  (1.651 m).   Weight as of this encounter: 53.5 kg.  Discharge Instructions  Discharge Instructions    Diet - low sodium heart healthy   Complete by:  As directed    Increase activity slowly   Complete by: As directed      Allergies as of 03/04/2020      Reactions   Tetracyclines & Related Anaphylaxis, Rash   Ciprofloxacin    Trouble swallowing unknown reaction according to wife       Medication List    STOP taking these medications   carvedilol 6.25 MG tablet Commonly known as: COREG   potassium chloride SA 20 MEQ tablet Commonly known as: KLOR-CON   verapamil 40 MG tablet Commonly known as: CALAN     TAKE these medications   acetaminophen 325 MG tablet Commonly known as: TYLENOL Take 2 tablets (650 mg total) by mouth every 6 (six) hours as needed for mild pain (or Fever >/= 101).   ciprofloxacin 500 MG tablet Commonly known as: Cipro Take 1 tablet (500 mg total) by mouth 2 (two) times daily for 5 days.   diazepam 5 MG tablet Commonly known as: VALIUM TAKE (1) TABLET BY MOUTH TWICE DAILY. What changed:   how much to take  how to take this  when to take this  additional instructions   Dry Eye Relief Drops 0.2-0.2-1 % Soln Generic drug: Glycerin-Hypromellose-PEG 400 Apply 1-2 drops to eye daily as needed (for dry eyes).   HYDROcodone-acetaminophen 7.5-325 MG tablet Commonly known as: NORCO Take 1 tablet by mouth 2 (two) times daily. Max APAP 3 GM IN 24 HOURS FROM ALL SOURCES What changed:   when to take this  additional instructions   levothyroxine 75 MCG tablet Commonly known as: SYNTHROID Take 75 mcg by mouth daily before breakfast. ALL MEDICATIONS TO BE CRUSHED AND PLACED IN APPLESAUCE   Myrbetriq 25 MG Tb24 tablet Generic drug: mirabegron ER Take 25 mg by mouth daily as needed (bladder spasms).      Follow-up Information    [name], MD Follow up.   Specialty: Internal Medicine Contact information: [facility] [address] [phone]        Rogene [place], MD Follow up.   Specialty: Gastroenterology Contact information: [place], [address] [place]  [place] [address] [phone]        Virl Cagey, MD Follow up.   Specialty: General Surgery Contact information: [address] Linna Hoff [facility] [address] [phone]          Allergies  Allergen Reactions   Tetracyclines & Related Anaphylaxis and Rash   Ciprofloxacin     Trouble swallowing unknown reaction according to wife     Consultations: Gi/surgery  Procedures/Studies: CT ABDOMEN PELVIS WO CONTRAST  Addendum Date: 03/02/2020   ADDENDUM REPORT: 03/02/2020 17:15 ADDENDUM: It should be noted that there is mild fecalization of bowel contents within some of the small bowel loops of the rectus diastasis. This may represent delayed transit through the small bowel although no true obstructive changes are seen. Electronically Signed   By: Inez Catalina M.D.   On: 03/02/2020 17:15   Result Date: 03/02/2020 CLINICAL DATA:  Acute abdominal pain and neutropenia EXAM: CT ABDOMEN AND PELVIS WITHOUT CONTRAST TECHNIQUE: Multidetector CT imaging of the abdomen and pelvis was performed following the standard protocol without IV contrast. COMPARISON:  08/26/2019 FINDINGS: Lower chest: Emphysematous changes with chronic scarring are identified. Hepatobiliary:  Gallbladder has been surgically removed. No focal hepatic abnormality is seen. The common bile duct is significantly dilated extending to a chronic distal common bile duct stone unchanged from the prior study. Pancreas: Unremarkable. No pancreatic ductal dilatation or surrounding inflammatory changes. Spleen: Normal in size without focal abnormality. Adrenals/Urinary Tract: Adrenal glands are within normal limits. The right kidney is atrophic with scattered nonobstructing renal stones. The largest of these lies in the lower pole measuring 6.6 mm. Cystic changes are noted bilaterally. Stable nonobstructing renal calculi are noted on the left. Fullness of the left collecting system is seen. No obstructing calculus is noted. Bladder is  decompressed by suprapubic catheter. There is a calcification identified to the right of the midline which appears within the bladder consistent with a previously passed calculus. This may be related to the current symptomatology. Stomach/Bowel: Postsurgical changes are noted in the colon. The appendix has been surgically removed. Fluid-filled loops of small bowel are noted. Diastasis of the rectus muscles is noted with herniation of bowel loops identified. This is stable in appearance from the prior exam. Vascular/Lymphatic: Aortic atherosclerosis. No enlarged abdominal or pelvic lymph nodes. Reproductive: Prostate is unremarkable. Other: No abdominal Morad hernia or abnormality. No abdominopelvic ascites. Musculoskeletal: Degenerative changes of lumbar spine are noted. IMPRESSION: Bilateral nonobstructing renal calculi as described relatively stable from the prior exam. Calculus within a decompressed bladder consistent with previously passed stone. This was not present on the prior exam. This may have contributed to the patient's clinical symptomatology. Stable rectus diastasis with multiple bowel loops within. Stable distal common bile duct stone with significant biliary ductal dilatation. Electronically Signed: By: Inez Catalina M.D. On: 03/01/2020 19:25   DG ERCP  Result Date: 03/04/2020 CLINICAL DATA:  Choledocholithiasis EXAM: ERCP TECHNIQUE: Multiple spot images obtained with the fluoroscopic device and submitted for interpretation post-procedure. COMPARISON:  CT 03/01/2020 FINDINGS: Series of fluoroscopic spot images document endoscopic cannulation and opacification of the CBD with placement of plastic biliary stent. CBD is dilated. There is incomplete opacification and visualization of the intrahepatic biliary tree which appears mildly ectatic centrally. Cholecystectomy clips. No evidence of extravasation. IMPRESSION: Endoscopic CBD cannulation and intervention with stent placement. These images were  submitted for radiologic interpretation only. Please see the procedural report for the amount of contrast and the fluoroscopy time utilized. Electronically Signed   By: Lucrezia Europe M.D.   On: 03/04/2020 08:22    (Echo, Carotid, EGD, Colonoscopy, ERCP)    Subjective: Resting in bed no new events  Tolerating po intake  Discharge Exam: Vitals:   03/03/20 2029 03/04/20 0258  BP: 108/65 133/66  Pulse: (!) 50 (!) 46  Resp: 18 18  Temp: 98.8 F (37.1 C) (!) 97.5 F (36.4 C)  SpO2: 100% 99%   Vitals:   03/03/20 1326 03/03/20 1630 03/03/20 2029 03/04/20 0258  BP: (!) 185/88 128/63 108/65 133/66  Pulse: (!) 59 (!) 47 (!) 50 (!) 46  Resp: 17 17 18 18   Temp: 98 F (36.7 C) 98 F (36.7 C) 98.8 F (37.1 C) (!) 97.5 F (36.4 C)  TempSrc:  Oral Oral Oral  SpO2:  96% 100% 99%  Weight:      Height:        General: Pt is alert, awake, not in acute distress Cardiovascular: RRR, S1/S2 +, no rubs, no gallops Respiratory: CTA bilaterally, no wheezing, no rhonchi Abdominal: Soft, NT, ND, bowel sounds +supra pubic catheter in place Extremities: no edema, no cyanosis    The results of  significant diagnostics from this hospitalization (including imaging, microbiology, ancillary and laboratory) are listed below for reference.     Microbiology: Recent Results (from the past 240 hour(s))  Urine culture     Status: Abnormal   Collection Time: 03/01/20  6:04 PM   Specimen: Urine, Suprapubic  Result Value Ref Range Status   Specimen Description   Final    URINE, SUPRAPUBIC Performed at [facility], [address] [place], [place] [address]    Special Requests   Final    NONE Performed at [facility], [address] [place], [place] [address]    Culture >=100,000 COLONIES/mL [facility] MORGANII (A)  Final   Report Status 03/04/2020 FINAL  Final   Organism ID, Bacteria MORGANELLA MORGANII (A)  Final      Susceptibility   Morganella morganii - MIC*    AMPICILLIN >=32 RESISTANT  Resistant     CEFAZOLIN RESISTANT Resistant     CEFTRIAXONE <=1 SENSITIVE Sensitive     CIPROFLOXACIN <=0.25 SENSITIVE Sensitive     GENTAMICIN <=1 SENSITIVE Sensitive     IMIPENEM 1 SENSITIVE Sensitive     NITROFURANTOIN 128 RESISTANT Resistant     TRIMETH/SULFA <=20 SENSITIVE Sensitive     AMPICILLIN/SULBACTAM 8 SENSITIVE Sensitive     PIP/TAZO <=4 SENSITIVE Sensitive     * >=100,000 COLONIES/mL MORGANELLA MORGANII  Culture, blood (routine x 2)     Status: None (Preliminary result)   Collection Time: 03/01/20  9:54 PM   Specimen: BLOOD LEFT FOREARM  Result Value Ref Range Status   Specimen Description BLOOD LEFT FOREARM  Final   Special Requests   Final    BOTTLES DRAWN AEROBIC AND ANAEROBIC Blood Culture adequate volume   Culture   Final    NO GROWTH 2 DAYS Performed at [facility], [address] [place], [place] [address]    Report Status PENDING  Incomplete  Culture, blood (routine x 2)     Status: None (Preliminary result)   Collection Time: 03/01/20  9:54 PM   Specimen: Left Antecubital; Blood  Result Value Ref Range Status   Specimen Description LEFT ANTECUBITAL  Final   Special Requests   Final    BOTTLES DRAWN AEROBIC AND ANAEROBIC Blood Culture adequate volume   Culture   Final    NO GROWTH 2 DAYS Performed at [facility], [address] [place], [place] [address]    Report Status PENDING  Incomplete  SARS Coronavirus 2 by RT PCR (hospital order, performed in [place] hospital lab) Nasopharyngeal Nasopharyngeal Swab     Status: None   Collection Time: 03/02/20 10:18 AM   Specimen: Nasopharyngeal Swab  Result Value Ref Range Status   SARS Coronavirus 2 NEGATIVE NEGATIVE Final    Comment: (NOTE) SARS-CoV-2 target nucleic acids are NOT DETECTED. The SARS-CoV-2 RNA is generally detectable in upper and lower respiratory specimens during the acute phase of infection. The lowest concentration of SARS-CoV-2 viral copies this assay can detect is 250 copies / mL. A  negative result does not preclude SARS-CoV-2 infection and should not be used as the sole basis for treatment or other patient management decisions.  A negative result may occur with improper specimen collection / handling, submission of specimen other than nasopharyngeal swab, presence of viral mutation(s) within the areas targeted by this assay, and inadequate number of viral copies (<250 copies / mL). A negative result must be combined with clinical observations, patient history, and epidemiological information. Fact Sheet for Patients:   StrictlyIdeas.no  Fact Sheet for Healthcare Providers: BankingDealers.co.za This test is not yet approved or cleared  by the [place] FDA and has been authorized for detection and/or diagnosis of SARS-CoV-2 by FDA under an Emergency Use Authorization (EUA).  This EUA will remain in effect (meaning this test can be used) for the duration of the COVID-19 declaration under Section 564(b)(1) of the Act, 21 U.S.C. section 360bbb-3(b)(1), unless the authorization is terminated or revoked sooner. Performed at [facility], [address], [place], [place] [address]      Labs: BNP (last 3 results) No results for input(s): BNP in the last 8760 hours. Basic Metabolic Panel: Recent Labs  Lab 03/01/20 1730 03/02/20 0338 03/03/20 0536  NA 136 139 141  K 5.1 4.5 3.8  CL 102 108 107  CO2 26 23 27   GLUCOSE 109* 87 97  BUN 49* 43* 25*  CREATININE 2.48* 1.94* 1.30*  CALCIUM 8.9 7.9* 8.2*   Liver Function Tests: Recent Labs  Lab 03/01/20 1730 03/02/20 0338 03/03/20 0536 03/04/20 0410  AST 221* 100* 35 23  ALT 195* 122* 76* 58*  ALKPHOS 226* 171* 144* 132*  BILITOT 1.6* 1.0 0.4 0.6  PROT 7.1 5.8* 5.6* 6.0*  ALBUMIN 3.8 3.1* 3.0* 3.2*   Recent Labs  Lab 03/04/20 0410  AMYLASE 50   No results for input(s): AMMONIA in the last 168 hours. CBC: Recent Labs  Lab 03/01/20 1730 03/02/20 0338  03/03/20 0536 03/04/20 0410  WBC 7.9 5.8 5.4 5.5  NEUTROABS 5.0  --  2.5  --   HGB 12.7* 11.0* 11.5* 11.9*  HCT 40.4 35.2* 36.2* 36.1*  MCV 98.3 99.2 99.2 96.5  PLT 128* 117* 123* 130*   Cardiac Enzymes: No results for input(s): CKTOTAL, CKMB, CKMBINDEX, TROPONINI in the last 168 hours. BNP: Invalid input(s): POCBNP CBG: No results for input(s): GLUCAP in the last 168 hours. D-Dimer No results for input(s): DDIMER in the last 72 hours. Hgb A1c No results for input(s): HGBA1C in the last 72 hours. Lipid Profile No results for input(s): CHOL, HDL, LDLCALC, TRIG, CHOLHDL, LDLDIRECT in the last 72 hours. Thyroid function studies No results for input(s): TSH, T4TOTAL, T3FREE, THYROIDAB in the last 72 hours.  Invalid input(s): FREET3 Anemia work up No results for input(s): VITAMINB12, FOLATE, FERRITIN, TIBC, IRON, RETICCTPCT in the last 72 hours. Urinalysis    Component Value Date/Time   COLORURINE AMBER (A) 03/01/2020 1801   APPEARANCEUR CLOUDY (A) 03/01/2020 1801   LABSPEC 1.016 03/01/2020 1801   PHURINE 8.0 03/01/2020 1801   GLUCOSEU NEGATIVE 03/01/2020 1801   HGBUR MODERATE (A) 03/01/2020 1801   BILIRUBINUR NEGATIVE 03/01/2020 1801   KETONESUR NEGATIVE 03/01/2020 1801   PROTEINUR 100 (A) 03/01/2020 1801   UROBILINOGEN 0.2 07/17/2014 1215   NITRITE POSITIVE (A) 03/01/2020 1801   LEUKOCYTESUR MODERATE (A) 03/01/2020 1801   Sepsis Labs Invalid input(s): PROCALCITONIN,  WBC,  LACTICIDVEN Microbiology Recent Results (from the past 240 hour(s))  Urine culture     Status: Abnormal   Collection Time: 03/01/20  6:04 PM   Specimen: Urine, Suprapubic  Result Value Ref Range Status   Specimen Description   Final    URINE, SUPRAPUBIC Performed at [facility], [address] [place], [place] [address]    Special Requests   Final    NONE Performed at [facility], [address], [place],  [address]    Culture >=100,000 COLONIES/mL [facility] MORGANII (A)  Final    Report Status 03/04/2020 FINAL  Final  Organism ID, Bacteria MORGANELLA MORGANII (A)  Final      Susceptibility   Morganella morganii - MIC*    AMPICILLIN >=32 RESISTANT Resistant     CEFAZOLIN RESISTANT Resistant     CEFTRIAXONE <=1 SENSITIVE Sensitive     CIPROFLOXACIN <=0.25 SENSITIVE Sensitive     GENTAMICIN <=1 SENSITIVE Sensitive     IMIPENEM 1 SENSITIVE Sensitive     NITROFURANTOIN 128 RESISTANT Resistant     TRIMETH/SULFA <=20 SENSITIVE Sensitive     AMPICILLIN/SULBACTAM 8 SENSITIVE Sensitive     PIP/TAZO <=4 SENSITIVE Sensitive     * >=100,000 COLONIES/mL MORGANELLA MORGANII  Culture, blood (routine x 2)     Status: None (Preliminary result)   Collection Time: 03/01/20  9:54 PM   Specimen: BLOOD LEFT FOREARM  Result Value Ref Range Status   Specimen Description BLOOD LEFT FOREARM  Final   Special Requests   Final    BOTTLES DRAWN AEROBIC AND ANAEROBIC Blood Culture adequate volume   Culture   Final    NO GROWTH 2 DAYS Performed at [facility], [address] [place], [place] [address]    Report Status PENDING  Incomplete  Culture, blood (routine x 2)     Status: None (Preliminary result)   Collection Time: 03/01/20  9:54 PM   Specimen: Left Antecubital; Blood  Result Value Ref Range Status   Specimen Description LEFT ANTECUBITAL  Final   Special Requests   Final    BOTTLES DRAWN AEROBIC AND ANAEROBIC Blood Culture adequate volume   Culture   Final    NO GROWTH 2 DAYS Performed at [facility], [address] [place], [place] [address]    Report Status PENDING  Incomplete  SARS Coronavirus 2 by RT PCR (hospital order, performed in [place] hospital lab) Nasopharyngeal Nasopharyngeal Swab     Status: None   Collection Time: 03/02/20 10:18 AM   Specimen: Nasopharyngeal Swab  Result Value Ref Range Status   SARS Coronavirus 2 NEGATIVE NEGATIVE Final    Comment: (NOTE) SARS-CoV-2 target nucleic acids are NOT DETECTED. The SARS-CoV-2 RNA is generally detectable  in upper and lower respiratory specimens during the acute phase of infection. The lowest concentration of SARS-CoV-2 viral copies this assay can detect is 250 copies / mL. A negative result does not preclude SARS-CoV-2 infection and should not be used as the sole basis for treatment or other patient management decisions.  A negative result may occur with improper specimen collection / handling, submission of specimen other than nasopharyngeal swab, presence of viral mutation(s) within the areas targeted by this assay, and inadequate number of viral copies (<250 copies / mL). A negative result must be combined with clinical observations, patient history, and epidemiological information. Fact Sheet for Patients:   StrictlyIdeas.no Fact Sheet for Healthcare Providers: BankingDealers.co.za This test is not yet approved or cleared  by the [place] FDA and has been authorized for detection and/or diagnosis of SARS-CoV-2 by FDA under an Emergency Use Authorization (EUA).  This EUA will remain in effect (meaning this test can be used) for the duration of the COVID-19 declaration under Section 564(b)(1) of the Act, 21 U.S.C. section 360bbb-3(b)(1), unless the authorization is terminated or revoked sooner. Performed at [facility], [address] [place], [place] [address]      Time coordinating discharge:  39 minutes  SIGNED:   Georgette Shell, MD  [facility] 03/04/2020, 1:15 PM Pager   If 7PM-7AM, please contact night-coverage [email or web address] Password [email or web address]

## 2020-03-04 NOTE — Progress Notes (Signed)
Nsg Discharge Note  Admit Date:  03/01/2020 Discharge date: 03/04/2020   Jordan Ward to be D/C'd Home per MD order.  AVS completed.   Patient/caregiver able to verbalize understanding.  Discharge Medication: Allergies as of 03/04/2020      Reactions   Tetracyclines & Related Anaphylaxis, Rash   Ciprofloxacin    Trouble swallowing unknown reaction according to wife       Medication List    STOP taking these medications   carvedilol 6.25 MG tablet Commonly known as: COREG   potassium chloride SA 20 MEQ tablet Commonly known as: KLOR-CON   verapamil 40 MG tablet Commonly known as: CALAN     TAKE these medications   acetaminophen 325 MG tablet Commonly known as: TYLENOL Take 2 tablets (650 mg total) by mouth every 6 (six) hours as needed for mild pain (or Fever >/= 101).   ciprofloxacin 500 MG tablet Commonly known as: Cipro Take 1 tablet (500 mg total) by mouth 2 (two) times daily for 5 days.   diazepam 5 MG tablet Commonly known as: VALIUM TAKE (1) TABLET BY MOUTH TWICE DAILY. What changed:   how much to take  how to take this  when to take this  additional instructions   Dry Eye Relief Drops 0.2-0.2-1 % Soln Generic drug: Glycerin-Hypromellose-PEG 400 Apply 1-2 drops to eye daily as needed (for dry eyes).   HYDROcodone-acetaminophen 7.5-325 MG tablet Commonly known as: NORCO Take 1 tablet by mouth 2 (two) times daily. Max APAP 3 GM IN 24 HOURS FROM ALL SOURCES What changed:   when to take this  additional instructions   levothyroxine 75 MCG tablet Commonly known as: SYNTHROID Take 75 mcg by mouth daily before breakfast. ALL MEDICATIONS TO BE CRUSHED AND PLACED IN APPLESAUCE   Myrbetriq 25 MG Tb24 tablet Generic drug: mirabegron ER Take 25 mg by mouth daily as needed (bladder spasms).       Discharge Assessment: Vitals:   03/04/20 0258 03/04/20 1346  BP: 133/66 110/68  Pulse: (!) 46 61  Resp: 18 17  Temp: (!) 97.5 F (36.4 C) 99.7 F (37.6  C)  SpO2: 99% 98%   Skin clean, dry and intact without evidence of skin break down, no evidence of skin tears noted. IV catheter discontinued intact. Site without signs and symptoms of complications - no redness or edema noted at insertion site, patient denies c/o pain - only slight tenderness at site.  Dressing with slight pressure applied.  D/c Instructions-Education: Discharge instructions given to patient/family with verbalized understanding. D/c education completed with patient/family including follow up instructions, medication list, d/c activities limitations if indicated, with other d/c instructions as indicated by MD - patient able to verbalize understanding, all questions fully answered. Patient instructed to return to ED, call 911, or call MD for any changes in condition.  Patient escorted via [place], and D/C home via private auto.  Alric Geise Loletha Grayer, RN 03/04/2020 2:17 PM

## 2020-03-05 ENCOUNTER — Telehealth: Payer: Self-pay | Admitting: Urology

## 2020-03-05 DIAGNOSIS — Z0001 Encounter for general adult medical examination with abnormal findings: Secondary | ICD-10-CM | POA: Diagnosis not present

## 2020-03-05 NOTE — Telephone Encounter (Signed)
Pt requests a nurse return his call regarding cath issues. He was in er on 03/01/20.

## 2020-03-06 LAB — CULTURE, BLOOD (ROUTINE X 2)
Culture: NO GROWTH
Culture: NO GROWTH
Special Requests: ADEQUATE
Special Requests: ADEQUATE

## 2020-03-07 NOTE — Telephone Encounter (Signed)
Pt. Jordan Ward he had been in hospital with a urinary infection and gall stones. They put a 13fr cath in him and it is leaking. Sent task to Dr. [name] asking does he want it changed sooner than next appt which is 18th.

## 2020-03-12 ENCOUNTER — Other Ambulatory Visit (INDEPENDENT_AMBULATORY_CARE_PROVIDER_SITE_OTHER): Payer: Self-pay | Admitting: *Deleted

## 2020-03-12 ENCOUNTER — Telehealth (INDEPENDENT_AMBULATORY_CARE_PROVIDER_SITE_OTHER): Payer: Self-pay | Admitting: Internal Medicine

## 2020-03-12 DIAGNOSIS — I10 Essential (primary) hypertension: Secondary | ICD-10-CM | POA: Diagnosis not present

## 2020-03-12 DIAGNOSIS — R7989 Other specified abnormal findings of blood chemistry: Secondary | ICD-10-CM

## 2020-03-12 DIAGNOSIS — N390 Urinary tract infection, site not specified: Secondary | ICD-10-CM | POA: Diagnosis not present

## 2020-03-12 DIAGNOSIS — K8001 Calculus of gallbladder with acute cholecystitis with obstruction: Secondary | ICD-10-CM | POA: Diagnosis not present

## 2020-03-12 DIAGNOSIS — Z0189 Encounter for other specified special examinations: Secondary | ICD-10-CM | POA: Diagnosis not present

## 2020-03-12 NOTE — Telephone Encounter (Signed)
Patient was called and told about lab work on 03/22/2020.

## 2020-03-12 NOTE — Telephone Encounter (Signed)
NUR states patient needs LFT's checked before ov on 6/24

## 2020-03-12 NOTE — Telephone Encounter (Signed)
Lab order has been completed for 03/22/2020. Patient will be sent a message as a reminder.

## 2020-03-13 ENCOUNTER — Telehealth (INDEPENDENT_AMBULATORY_CARE_PROVIDER_SITE_OTHER): Payer: Self-pay | Admitting: Internal Medicine

## 2020-03-13 ENCOUNTER — Other Ambulatory Visit: Payer: Self-pay

## 2020-03-13 ENCOUNTER — Ambulatory Visit (INDEPENDENT_AMBULATORY_CARE_PROVIDER_SITE_OTHER): Payer: Medicare Other

## 2020-03-13 DIAGNOSIS — N390 Urinary tract infection, site not specified: Secondary | ICD-10-CM

## 2020-03-13 DIAGNOSIS — R339 Retention of urine, unspecified: Secondary | ICD-10-CM

## 2020-03-13 NOTE — Telephone Encounter (Signed)
Patient's wife called and question answered. Mitzie - please add the following  number (wife) to list [phone] .

## 2020-03-13 NOTE — Telephone Encounter (Signed)
Spouse called has questions about patients lab work that needs to be done - ph# [phone]

## 2020-03-13 NOTE — Progress Notes (Signed)
Cath Change/ Replacement  Patient is present today for a catheter change due to urinary retention.  38ml of water was removed from the balloon, a 20FR foley cath was removed with out difficulty.  Patient was cleaned and prepped in a sterile fashion with betadine. A 24 FR foley cath was replaced into the bladder no complications were noted Urine return was noted 63ml and urine was yellow in color. The balloon was filled with 33ml of sterile water. A leg bag was attached for drainage.Pt. was given proper instructions on catheter care.     Performed by: Valentina Lucks, LPN   Follow up: 3 weeks

## 2020-03-16 ENCOUNTER — Ambulatory Visit: Payer: Medicare Other

## 2020-03-22 ENCOUNTER — Ambulatory Visit (INDEPENDENT_AMBULATORY_CARE_PROVIDER_SITE_OTHER): Payer: Medicare Other | Admitting: Internal Medicine

## 2020-03-22 ENCOUNTER — Other Ambulatory Visit: Payer: Self-pay

## 2020-03-22 ENCOUNTER — Encounter (INDEPENDENT_AMBULATORY_CARE_PROVIDER_SITE_OTHER): Payer: Self-pay | Admitting: Internal Medicine

## 2020-03-22 VITALS — BP 126/75 | HR 60 | Temp 98.9°F | Ht 64.0 in | Wt 114.0 lb

## 2020-03-22 DIAGNOSIS — K8050 Calculus of bile duct without cholangitis or cholecystitis without obstruction: Secondary | ICD-10-CM

## 2020-03-22 NOTE — Patient Instructions (Signed)
LFTs to be done at the time of office visit with Dr. Nevada Crane along with other blood work. ERCP with stent and stone removal to be scheduled next month. Plain film 2 days before at the time of preop visit.

## 2020-03-22 NOTE — Progress Notes (Signed)
Presenting complaint.  Follow-up for choledocholithiasis.  Database and subjective:  Patient is 79 year old Caucasian male who is here for scheduled visit. Patient was hospitalized 3 weeks ago for nausea vomiting abdominal pain and abnormal LFTs.  He was found to have markedly dilated biliary system with calcified stone.  He was seen in consultation by Dr. Gala Romney who reviewed prior studies and noted that he had a stone dating back to 2014. He underwent ERCP on 03/03/2020.  Biliary system was markedly dilated with large J-shaped calcified stone.  Sphincterotomy was performed but the stone was too large to be removed).  Mechanical lithotripsy was performed with removal of stone fragments but some of the fragments could not be removed with severe vascular stone.  Therefore biliary stent was left in place. Patient nausea vomiting and abdominal pain resolved and his transaminases began to trend downward.  He was able to go home 24 hours later. Patient states he has not experienced abdominal pain nausea or vomiting since then.  He says he has good appetite.  He has not lost any weight recently.  He states he used to weigh 185 pounds but that was several years ago.  He believes his weight has been stable over the last 1 year. He also denies fever chills pruritus.  His bowels move every other day.  He denies melena or rectal bleeding.  He has chronic pain in paresthesias he takes pain medication.  He was diagnosed with MS and is 79 years old.  He does not use walker when he is at home but when he leaves home he is using walker like today. He has suprapubic catheter.  He states was changed by Dr. Alyson Ingles few days ago. Patient states he has an appointment to see Dr. Delphina Cahill on 03/26/2020 and he is scheduled to have blood work that day.   Current Medications: Outpatient Encounter Medications as of 03/22/2020  Medication Sig  . AMLODIPINE BESYLATE PO Take 5 mg by mouth daily.  . carvedilol (COREG) 6.25 MG  tablet Take 6.25 mg by mouth 2 (two) times daily with a meal.  . diazepam (VALIUM) 5 MG tablet TAKE (1) TABLET BY MOUTH TWICE DAILY. (Patient taking differently: Take 5 mg by mouth in the morning and at bedtime. ALL MEDICATIONS TO BE CRUSHED AND PLACED IN APPLESAUCE)  . HYDROcodone-acetaminophen (NORCO) 7.5-325 MG per tablet Take 1 tablet by mouth 2 (two) times daily. Max APAP 3 GM IN 24 HOURS FROM ALL SOURCES (Patient taking differently: Take 1 tablet by mouth in the morning and at bedtime. ALL MEDICATIONS TO BE CRUSHED AND PLACED IN APPLESAUCE)  . levothyroxine (SYNTHROID) 75 MCG tablet Take 75 mcg by mouth daily before breakfast. ALL MEDICATIONS TO BE CRUSHED AND PLACED IN APPLESAUCE  . mirabegron ER (MYRBETRIQ) 25 MG TB24 tablet Take 25 mg by mouth daily as needed (bladder spasms).  . [DISCONTINUED] acetaminophen (TYLENOL) 325 MG tablet Take 2 tablets (650 mg total) by mouth every 6 (six) hours as needed for mild pain (or Fever >/= 101). (Patient not taking: Reported on 03/22/2020)  . [DISCONTINUED] Glycerin-Hypromellose-PEG 400 (DRY EYE RELIEF DROPS) 0.2-0.2-1 % SOLN Apply 1-2 drops to eye daily as needed (for dry eyes). (Patient not taking: Reported on 03/22/2020)   No facility-administered encounter medications on file as of 03/22/2020.     Objective: Blood pressure 126/75, pulse 60, temperature 98.9 F (37.2 C), temperature source Oral, height 5' 4" (1.626 m), weight 114 lb (51.7 kg). Patient is alert and in no acute distress.  He is wearing a mask. Conjunctiva is pink. Sclera is nonicteric Oropharyngeal mucosa is normal. No neck masses or thyromegaly noted. Cardiac exam with regular rhythm normal S1 and S2. No murmur or gallop noted. Lungs are clear to auscultation. Abdomen symmetrical.  He has long midline scar.  His suprapubic catheter in place.  Abdomen is soft and nontender with organomegaly or masses. Extremities are thin but there is no edema.  He has mild clubbing.  Labs/studies  Results:  CBC Latest Ref Rng & Units 03/04/2020 03/03/2020 03/02/2020  WBC 4.0 - 10.5 K/uL 5.5 5.4 5.8  Hemoglobin 13.0 - 17.0 g/dL 11.9(L) 11.5(L) 11.0(L)  Hematocrit 39 - 52 % 36.1(L) 36.2(L) 35.2(L)  Platelets 150 - 400 K/uL 130(L) 123(L) 117(L)    CMP Latest Ref Rng & Units 03/04/2020 03/03/2020 03/02/2020  Glucose 70 - 99 mg/dL - 97 87  BUN 8 - 23 mg/dL - 25(H) 43(H)  Creatinine 0.61 - 1.24 mg/dL - 1.30(H) 1.94(H)  Sodium 135 - 145 mmol/L - 141 139  Potassium 3.5 - 5.1 mmol/L - 3.8 4.5  Chloride 98 - 111 mmol/L - 107 108  CO2 22 - 32 mmol/L - 27 23  Calcium 8.9 - 10.3 mg/dL - 8.2(L) 7.9(L)  Total Protein 6.5 - 8.1 g/dL 6.0(L) 5.6(L) 5.8(L)  Total Bilirubin 0.3 - 1.2 mg/dL 0.6 0.4 1.0  Alkaline Phos 38 - 126 U/L 132(H) 144(H) 171(H)  AST 15 - 41 U/L 23 35 100(H)  ALT 0 - 44 U/L 58(H) 76(H) 122(H)    Hepatic Function Latest Ref Rng & Units 03/04/2020 03/03/2020 03/02/2020  Total Protein 6.5 - 8.1 g/dL 6.0(L) 5.6(L) 5.8(L)  Albumin 3.5 - 5.0 g/dL 3.2(L) 3.0(L) 3.1(L)  AST 15 - 41 U/L 23 35 100(H)  ALT 0 - 44 U/L 58(H) 76(H) 122(H)  Alk Phosphatase 38 - 126 U/L 132(H) 144(H) 171(H)  Total Bilirubin 0.3 - 1.2 mg/dL 0.6 0.4 1.0  Bilirubin, Direct 0.0 - 0.2 mg/dL 0.1 0.1 0.3(H)      Assessment:  #1.  Choledocholithiasis.  Patient apparently has had common duct stone dating back to 2014.  He underwent ERCP on 6 02/15/2014 revealing markedly dilated biliary system with large calcified stone requiring mechanical lithotripsy and all other fragments were not removed.  Therefore stent was left in place.  He is now asymptomatic.  He will need to return for another ERCP to improve stent and fragments.  Plan:  LFTs with next blood work at Dr. Olivia Canter office. Plan for outpatient ERCP to remove biliary stent and stone fragments. Patient will have KUB at the time of preop visit. Please visit on as-needed basis.

## 2020-03-23 ENCOUNTER — Encounter (INDEPENDENT_AMBULATORY_CARE_PROVIDER_SITE_OTHER): Payer: Self-pay | Admitting: *Deleted

## 2020-03-23 ENCOUNTER — Other Ambulatory Visit (INDEPENDENT_AMBULATORY_CARE_PROVIDER_SITE_OTHER): Payer: Self-pay | Admitting: *Deleted

## 2020-03-23 DIAGNOSIS — K8050 Calculus of bile duct without cholangitis or cholecystitis without obstruction: Secondary | ICD-10-CM

## 2020-04-03 ENCOUNTER — Other Ambulatory Visit: Payer: Self-pay

## 2020-04-03 ENCOUNTER — Ambulatory Visit (INDEPENDENT_AMBULATORY_CARE_PROVIDER_SITE_OTHER): Payer: Medicare Other

## 2020-04-03 DIAGNOSIS — R339 Retention of urine, unspecified: Secondary | ICD-10-CM

## 2020-04-03 NOTE — Progress Notes (Signed)
Cath Change/ Replacement  Patient is present today for a catheter change due to urinary retention.  32ml of water was removed from the balloon, a 24FR foley cath was removed with out difficulty.  Patient was cleaned and prepped in a sterile fashion with betadine. A 24 FR foley cath was replaced into the bladder no complications were noted Urine return was noted 72ml and urine was yellow in color. The balloon was filled with 38ml of sterile water. A leg bag was attached for drainage. Patient was given proper instruction on catheter care.    Performed by: Antionette Char, Zamiah Tollett,LPN

## 2020-04-17 NOTE — Patient Instructions (Signed)
Jordan Ward  04/17/2020     @PREFPERIOPPHARMACY @   Your procedure is scheduled on  04/20/2020   Report to [facility] at  1355 (1:55)   P.M.  Call this number if you have problems the morning of surgery:  [phone]   Remember:  Do not eat or drink anything after midnight the night before your procedure.                        Take these medicines the morning of surgery with A SIP OF WATER  Amlodipine, carvedilol, hydrocodone (if needed), levothyroxine.    Do not wear jewelry, make-up or nail polish.  Do not wear lotions, powders, or perfumes. Please wear deodorant and brush your teeth.  Do not shave 48 hours prior to surgery.  Men may shave face and neck.  Do not bring valuables to the hospital.  [facility] is not responsible for any belongings or valuables.  Contacts, dentures or bridgework may not be worn into surgery.  Leave your suitcase in the car.  After surgery it may be brought to your room.  For patients admitted to the hospital, discharge time will be determined by your treatment team.  Patients discharged the day of surgery will not be allowed to drive home.   Name and phone number of your driver:   family Special instructions:  DO NOT smoke the morning of your procedure.  Please read over the following fact sheets that you were given. Anesthesia Post-op Instructions and Care and Recovery After Surgery       Endoscopic Retrograde Cholangiopancreatogram, Care After This sheet gives you information about how to care for yourself after your procedure. Your health care provider may also give you more specific instructions. If you have problems or questions, contact your health care provider. What can I expect after the procedure? After the procedure, it is common to have:  Soreness in your throat.  Nausea.  Bloating.  Dizziness.  Tiredness (fatigue). Follow these instructions at home:   Take over-the-counter and prescription medicines only as  told by your health care provider.  Do not drive for 24 hours if you were given a medicine to help you relax (sedative) during your procedure. Have someone stay with you for 24 hours after the procedure.  Return to your normal activities as told by your health care provider. Ask your health care provider what activities are safe for you.  Return to eating what you normally do as soon as you feel well enough or as told by your health care provider.  Keep all follow-up visits as told by your health care provider. This is important. Contact a health care provider if:  You have pain in your abdomen that does not get better with medicine.  You develop signs of infection, such as: ? Chills. ? Feeling unwell. Get help right away if:  You have difficulty swallowing.  You have worsening pain in your throat, chest, or abdomen.  You vomit bright red blood or a substance that looks like coffee grounds.  You have bloody or very black stools.  You have a fever.  You have a sudden increase in swelling (bloating) in your abdomen. Summary  After the procedure, it is common to feel tired and to have some discomfort in your throat.  Contact your health care provider if you have signs of infection--such as chills or feeling unwell--or if you have pain that does not  improve with medicine.  Get help right away if you have trouble swallowing, worsening pain, bloody or black vomit, bloody or black stools, a fever, or increased swelling in your abdomen.  Keep all follow-up visits as told by your health care provider. This is important. This information is not intended to replace advice given to you by your health care provider. Make sure you discuss any questions you have with your health care provider. Document Revised: 08/28/2017 Document Reviewed: 08/04/2016 Elsevier Patient Education  2020 [place] Anesthesia, Adult, Care After This sheet gives you information about how to care for  yourself after your procedure. Your health care provider may also give you more specific instructions. If you have problems or questions, contact your health care provider. What can I expect after the procedure? After the procedure, the following side effects are common:  Pain or discomfort at the IV site.  Nausea.  Vomiting.  Sore throat.  Trouble concentrating.  Feeling cold or chills.  Weak or tired.  Sleepiness and fatigue.  Soreness and body aches. These side effects can affect parts of the body that were not involved in surgery. Follow these instructions at home:  For at least 24 hours after the procedure:  Have a responsible adult stay with you. It is important to have someone help care for you until you are awake and alert.  Rest as needed.  Do not: ? Participate in activities in which you could fall or become injured. ? Drive. ? Use heavy machinery. ? Drink alcohol. ? Take sleeping pills or medicines that cause drowsiness. ? Make important decisions or sign legal documents. ? Take care of children on your own. Eating and drinking  Follow any instructions from your health care provider about eating or drinking restrictions.  When you feel hungry, start by eating small amounts of foods that are soft and easy to digest (bland), such as toast. Gradually return to your regular diet.  Drink enough fluid to keep your urine pale yellow.  If you vomit, rehydrate by drinking water, juice, or clear broth. General instructions  If you have sleep apnea, surgery and certain medicines can increase your risk for breathing problems. Follow instructions from your health care provider about wearing your sleep device: ? Anytime you are sleeping, including during daytime naps. ? While taking prescription pain medicines, sleeping medicines, or medicines that make you drowsy.  Return to your normal activities as told by your health care provider. Ask your health care provider  what activities are safe for you.  Take over-the-counter and prescription medicines only as told by your health care provider.  If you smoke, do not smoke without supervision.  Keep all follow-up visits as told by your health care provider. This is important. Contact a health care provider if:  You have nausea or vomiting that does not get better with medicine.  You cannot eat or drink without vomiting.  You have pain that does not get better with medicine.  You are unable to pass urine.  You develop a skin rash.  You have a fever.  You have redness around your IV site that gets worse. Get help right away if:  You have difficulty breathing.  You have chest pain.  You have blood in your urine or stool, or you vomit blood. Summary  After the procedure, it is common to have a sore throat or nausea. It is also common to feel tired.  Have a responsible adult stay with you for the  first 24 hours after general anesthesia. It is important to have someone help care for you until you are awake and alert.  When you feel hungry, start by eating small amounts of foods that are soft and easy to digest (bland), such as toast. Gradually return to your regular diet.  Drink enough fluid to keep your urine pale yellow.  Return to your normal activities as told by your health care provider. Ask your health care provider what activities are safe for you. This information is not intended to replace advice given to you by your health care provider. Make sure you discuss any questions you have with your health care provider. Document Revised: 09/18/2017 Document Reviewed: 05/01/2017 Elsevier Patient Education  [place].

## 2020-04-18 ENCOUNTER — Encounter (HOSPITAL_COMMUNITY): Payer: Self-pay

## 2020-04-18 ENCOUNTER — Other Ambulatory Visit (HOSPITAL_COMMUNITY)
Admission: RE | Admit: 2020-04-18 | Discharge: 2020-04-18 | Disposition: A | Discharge status: Home / Self Care | Payer: Medicare Other | Source: Ambulatory Visit | Attending: Internal Medicine | Admitting: Internal Medicine

## 2020-04-18 ENCOUNTER — Ambulatory Visit (HOSPITAL_COMMUNITY)
Admission: RE | Admit: 2020-04-18 | Discharge: 2020-04-18 | Disposition: A | Discharge status: Home / Self Care | Payer: Medicare Other | Source: Ambulatory Visit | Attending: Internal Medicine | Admitting: Internal Medicine

## 2020-04-18 ENCOUNTER — Encounter (HOSPITAL_COMMUNITY)
Admission: RE | Admit: 2020-04-18 | Discharge: 2020-04-18 | Disposition: A | Discharge status: Home / Self Care | Payer: Medicare Other | Source: Ambulatory Visit | Attending: Internal Medicine | Admitting: Internal Medicine

## 2020-04-18 ENCOUNTER — Other Ambulatory Visit: Payer: Self-pay

## 2020-04-18 DIAGNOSIS — Z9689 Presence of other specified functional implants: Secondary | ICD-10-CM | POA: Diagnosis not present

## 2020-04-18 DIAGNOSIS — K8050 Calculus of bile duct without cholangitis or cholecystitis without obstruction: Secondary | ICD-10-CM | POA: Insufficient documentation

## 2020-04-18 DIAGNOSIS — Z20822 Contact with and (suspected) exposure to covid-19: Secondary | ICD-10-CM | POA: Insufficient documentation

## 2020-04-18 DIAGNOSIS — Z01818 Encounter for other preprocedural examination: Secondary | ICD-10-CM | POA: Diagnosis not present

## 2020-04-18 DIAGNOSIS — K838 Other specified diseases of biliary tract: Secondary | ICD-10-CM

## 2020-04-18 LAB — COMPREHENSIVE METABOLIC PANEL
ALT: 13 U/L (ref 0–44)
AST: 18 U/L (ref 15–41)
Albumin: 4.1 g/dL (ref 3.5–5.0)
Alkaline Phosphatase: 85 U/L (ref 38–126)
Anion gap: 14 (ref 5–15)
BUN: 27 mg/dL — ABNORMAL HIGH (ref 8–23)
CO2: 26 mmol/L (ref 22–32)
Calcium: 9.3 mg/dL (ref 8.9–10.3)
Chloride: 101 mmol/L (ref 98–111)
Creatinine, Ser: 1.6 mg/dL — ABNORMAL HIGH (ref 0.61–1.24)
GFR calc Af Amer: 47 mL/min — ABNORMAL LOW (ref >60)
GFR calc non Af Amer: 41 mL/min — ABNORMAL LOW (ref >60)
Glucose, Bld: 99 mg/dL (ref 70–99)
Potassium: 3.5 mmol/L (ref 3.5–5.1)
Sodium: 141 mmol/L (ref 135–145)
Total Bilirubin: 0.7 mg/dL (ref 0.3–1.2)
Total Protein: 7.4 g/dL (ref 6.5–8.1)

## 2020-04-18 LAB — SARS CORONAVIRUS 2 (TAT 6-24 HRS): SARS Coronavirus 2: NEGATIVE

## 2020-04-18 LAB — CBC WITH DIFFERENTIAL/PLATELET
Abs Immature Granulocytes: 0.01 10*3/uL (ref 0.00–0.07)
Basophils Absolute: 0.1 10*3/uL (ref 0.0–0.1)
Basophils Relative: 1 %
Eosinophils Absolute: 0.4 10*3/uL (ref 0.0–0.5)
Eosinophils Relative: 6 %
HCT: 40.2 % (ref 39.0–52.0)
Hemoglobin: 12.9 g/dL — ABNORMAL LOW (ref 13.0–17.0)
Immature Granulocytes: 0 %
Lymphocytes Relative: 35 %
Lymphs Abs: 2.1 10*3/uL (ref 0.7–4.0)
MCH: 31.2 pg (ref 26.0–34.0)
MCHC: 32.1 g/dL (ref 30.0–36.0)
MCV: 97.1 fL (ref 80.0–100.0)
Monocytes Absolute: 0.4 10*3/uL (ref 0.1–1.0)
Monocytes Relative: 7 %
Neutro Abs: 3 10*3/uL (ref 1.7–7.7)
Neutrophils Relative %: 51 %
Platelets: 166 10*3/uL (ref 150–400)
RBC: 4.14 MIL/uL — ABNORMAL LOW (ref 4.22–5.81)
RDW: 13.3 % (ref 11.5–15.5)
WBC: 5.9 10*3/uL (ref 4.0–10.5)
nRBC: 0 % (ref 0.0–0.2)

## 2020-04-20 ENCOUNTER — Telehealth: Payer: Self-pay | Admitting: Urology

## 2020-04-20 ENCOUNTER — Telehealth: Payer: Self-pay

## 2020-04-20 ENCOUNTER — Other Ambulatory Visit: Payer: Self-pay

## 2020-04-20 ENCOUNTER — Ambulatory Visit (HOSPITAL_COMMUNITY): Payer: Medicare Other | Admitting: Anesthesiology

## 2020-04-20 ENCOUNTER — Encounter (HOSPITAL_COMMUNITY): Payer: Self-pay | Admitting: Internal Medicine

## 2020-04-20 ENCOUNTER — Ambulatory Visit (HOSPITAL_COMMUNITY)
Admission: RE | Admit: 2020-04-20 | Discharge: 2020-04-20 | Disposition: A | Discharge status: Home / Self Care | Payer: Medicare Other | Attending: Internal Medicine | Admitting: Internal Medicine

## 2020-04-20 ENCOUNTER — Encounter (HOSPITAL_COMMUNITY)
Admission: RE | Disposition: A | Discharge status: Home / Self Care | Payer: Self-pay | Source: Home / Self Care | Attending: Internal Medicine

## 2020-04-20 ENCOUNTER — Ambulatory Visit (HOSPITAL_COMMUNITY): Payer: Medicare Other

## 2020-04-20 DIAGNOSIS — Z79899 Other long term (current) drug therapy: Secondary | ICD-10-CM | POA: Diagnosis not present

## 2020-04-20 DIAGNOSIS — Z8249 Family history of ischemic heart disease and other diseases of the circulatory system: Secondary | ICD-10-CM | POA: Insufficient documentation

## 2020-04-20 DIAGNOSIS — Z79890 Hormone replacement therapy (postmenopausal): Secondary | ICD-10-CM | POA: Diagnosis not present

## 2020-04-20 DIAGNOSIS — G35 Multiple sclerosis: Secondary | ICD-10-CM | POA: Diagnosis not present

## 2020-04-20 DIAGNOSIS — I130 Hypertensive heart and chronic kidney disease with heart failure and stage 1 through stage 4 chronic kidney disease, or unspecified chronic kidney disease: Secondary | ICD-10-CM | POA: Diagnosis not present

## 2020-04-20 DIAGNOSIS — E039 Hypothyroidism, unspecified: Secondary | ICD-10-CM | POA: Insufficient documentation

## 2020-04-20 DIAGNOSIS — N189 Chronic kidney disease, unspecified: Secondary | ICD-10-CM | POA: Diagnosis not present

## 2020-04-20 DIAGNOSIS — I10 Essential (primary) hypertension: Secondary | ICD-10-CM | POA: Diagnosis not present

## 2020-04-20 DIAGNOSIS — M1990 Unspecified osteoarthritis, unspecified site: Secondary | ICD-10-CM | POA: Diagnosis not present

## 2020-04-20 DIAGNOSIS — I2510 Atherosclerotic heart disease of native coronary artery without angina pectoris: Secondary | ICD-10-CM | POA: Insufficient documentation

## 2020-04-20 DIAGNOSIS — K8050 Calculus of bile duct without cholangitis or cholecystitis without obstruction: Secondary | ICD-10-CM | POA: Insufficient documentation

## 2020-04-20 DIAGNOSIS — I739 Peripheral vascular disease, unspecified: Secondary | ICD-10-CM | POA: Insufficient documentation

## 2020-04-20 DIAGNOSIS — G4733 Obstructive sleep apnea (adult) (pediatric): Secondary | ICD-10-CM | POA: Insufficient documentation

## 2020-04-20 DIAGNOSIS — Z20822 Contact with and (suspected) exposure to covid-19: Secondary | ICD-10-CM | POA: Diagnosis not present

## 2020-04-20 DIAGNOSIS — Z8673 Personal history of transient ischemic attack (TIA), and cerebral infarction without residual deficits: Secondary | ICD-10-CM | POA: Insufficient documentation

## 2020-04-20 DIAGNOSIS — E785 Hyperlipidemia, unspecified: Secondary | ICD-10-CM | POA: Insufficient documentation

## 2020-04-20 DIAGNOSIS — I509 Heart failure, unspecified: Secondary | ICD-10-CM | POA: Diagnosis not present

## 2020-04-20 DIAGNOSIS — T83010A Breakdown (mechanical) of cystostomy catheter, initial encounter: Secondary | ICD-10-CM | POA: Diagnosis not present

## 2020-04-20 DIAGNOSIS — Z87891 Personal history of nicotine dependence: Secondary | ICD-10-CM | POA: Diagnosis not present

## 2020-04-20 DIAGNOSIS — K838 Other specified diseases of biliary tract: Secondary | ICD-10-CM | POA: Diagnosis not present

## 2020-04-20 DIAGNOSIS — Z4659 Encounter for fitting and adjustment of other gastrointestinal appliance and device: Secondary | ICD-10-CM | POA: Diagnosis not present

## 2020-04-20 DIAGNOSIS — T839XXD Unspecified complication of genitourinary prosthetic device, implant and graft, subsequent encounter: Secondary | ICD-10-CM

## 2020-04-20 DIAGNOSIS — T839XXA Unspecified complication of genitourinary prosthetic device, implant and graft, initial encounter: Secondary | ICD-10-CM

## 2020-04-20 DIAGNOSIS — G4730 Sleep apnea, unspecified: Secondary | ICD-10-CM | POA: Diagnosis not present

## 2020-04-20 HISTORY — PX: GASTROINTESTINAL STENT REMOVAL: SHX6384

## 2020-04-20 HISTORY — PX: REMOVAL OF STONES: SHX5545

## 2020-04-20 HISTORY — PX: ERCP: SHX5425

## 2020-04-20 HISTORY — DX: Unspecified complication of genitourinary prosthetic device, implant and graft, initial encounter: T83.9XXA

## 2020-04-20 SURGERY — ERCP, WITH INTERVENTION IF INDICATED
Anesthesia: General | Wound class: Clean Contaminated

## 2020-04-20 MED ORDER — CHLORHEXIDINE GLUCONATE CLOTH 2 % EX PADS: 6.0000 | MEDICATED_PAD | Freq: Once | CUTANEOUS | Status: DC

## 2020-04-20 MED ORDER — FENTANYL CITRATE (PF) 100 MCG/2ML IJ SOLN
INTRAMUSCULAR | Status: AC
  Filled 2020-04-20: qty 2

## 2020-04-20 MED ORDER — SUGAMMADEX SODIUM 500 MG/5ML IV SOLN
INTRAVENOUS | Status: DC | PRN
  Administered 2020-04-20: 200 mg via INTRAVENOUS

## 2020-04-20 MED ORDER — ROCURONIUM BROMIDE 100 MG/10ML IV SOLN
INTRAVENOUS | Status: DC | PRN
  Administered 2020-04-20: 20 mg via INTRAVENOUS
  Administered 2020-04-20: 10 mg via INTRAVENOUS

## 2020-04-20 MED ORDER — SUCCINYLCHOLINE CHLORIDE 20 MG/ML IJ SOLN
INTRAMUSCULAR | Status: DC | PRN
  Administered 2020-04-20: 120 mg via INTRAVENOUS

## 2020-04-20 MED ORDER — CEFAZOLIN SODIUM-DEXTROSE 2-4 GM/100ML-% IV SOLN
2.0000 g | INTRAVENOUS | Status: AC
  Administered 2020-04-20: 2 g via INTRAVENOUS

## 2020-04-20 MED ORDER — LACTATED RINGERS IV SOLN
INTRAVENOUS | Status: DC | PRN
Start: 2020-04-20 — End: 2020-04-20

## 2020-04-20 MED ORDER — ROCURONIUM BROMIDE 10 MG/ML (PF) SYRINGE
PREFILLED_SYRINGE | INTRAVENOUS | Status: AC
  Filled 2020-04-20: qty 10

## 2020-04-20 MED ORDER — SODIUM CHLORIDE 0.9 % IV SOLN: INTRAVENOUS | Status: DC | PRN

## 2020-04-20 MED ORDER — SUCCINYLCHOLINE CHLORIDE 200 MG/10ML IV SOSY
PREFILLED_SYRINGE | INTRAVENOUS | Status: AC
  Filled 2020-04-20: qty 10

## 2020-04-20 MED ORDER — ONDANSETRON HCL 4 MG/2ML IJ SOLN: 4.0000 mg | Freq: Once | INTRAMUSCULAR | Status: DC | PRN

## 2020-04-20 MED ORDER — WATER FOR IRRIGATION, STERILE IR SOLN
Status: DC | PRN
  Administered 2020-04-20: 1000 mL

## 2020-04-20 MED ORDER — CEFAZOLIN SODIUM-DEXTROSE 2-4 GM/100ML-% IV SOLN
INTRAVENOUS | Status: AC
  Filled 2020-04-20: qty 100

## 2020-04-20 MED ORDER — EPHEDRINE SULFATE 50 MG/ML IJ SOLN
INTRAMUSCULAR | Status: DC | PRN
  Administered 2020-04-20: 5 mg via INTRAVENOUS
  Administered 2020-04-20: 10 mg via INTRAVENOUS
  Administered 2020-04-20: 5 mg via INTRAVENOUS

## 2020-04-20 MED ORDER — PROPOFOL 10 MG/ML IV BOLUS
INTRAVENOUS | Status: DC | PRN
  Administered 2020-04-20: 60 mg via INTRAVENOUS

## 2020-04-20 MED ORDER — ONDANSETRON HCL 4 MG/2ML IJ SOLN
INTRAMUSCULAR | Status: AC
  Filled 2020-04-20: qty 2

## 2020-04-20 MED ORDER — GLUCAGON HCL RDNA (DIAGNOSTIC) 1 MG IJ SOLR
INTRAMUSCULAR | Status: DC | PRN
Start: 2020-04-20 — End: 2020-04-20
  Administered 2020-04-20 (×2): .25 mg via INTRAVENOUS

## 2020-04-20 MED ORDER — FENTANYL CITRATE (PF) 100 MCG/2ML IJ SOLN
25.0000 ug | INTRAMUSCULAR | Status: AC | PRN
  Administered 2020-04-20 (×2): 25 ug via INTRAVENOUS

## 2020-04-20 MED ORDER — LACTATED RINGERS IV SOLN: Freq: Once | INTRAVENOUS | Status: AC

## 2020-04-20 MED ORDER — ONDANSETRON HCL 4 MG/2ML IJ SOLN
INTRAMUSCULAR | Status: DC | PRN
  Administered 2020-04-20: 4 mg via INTRAVENOUS

## 2020-04-20 MED ORDER — FENTANYL CITRATE (PF) 100 MCG/2ML IJ SOLN
25.0000 ug | Freq: Once | INTRAMUSCULAR | Status: AC
  Administered 2020-04-20: 25 ug via INTRAVENOUS

## 2020-04-20 MED ORDER — HYDROMORPHONE HCL 1 MG/ML IJ SOLN: 0.2500 mg | INTRAMUSCULAR | Status: DC | PRN

## 2020-04-20 MED ORDER — LIDOCAINE 2% (20 MG/ML) 5 ML SYRINGE
INTRAMUSCULAR | Status: AC
  Filled 2020-04-20: qty 5

## 2020-04-20 MED ORDER — LIDOCAINE VISCOUS HCL 2 % MT SOLN
OROMUCOSAL | Status: AC
  Filled 2020-04-20: qty 15

## 2020-04-20 MED ORDER — PROPOFOL 10 MG/ML IV BOLUS
INTRAVENOUS | Status: AC
  Filled 2020-04-20: qty 20

## 2020-04-20 MED ORDER — LIDOCAINE HCL (CARDIAC) PF 50 MG/5ML IV SOSY
PREFILLED_SYRINGE | INTRAVENOUS | Status: DC | PRN
  Administered 2020-04-20: 100 mg via INTRAVENOUS

## 2020-04-20 MED ORDER — LIDOCAINE VISCOUS HCL 2 % MT SOLN
15.0000 mL | Freq: Once | OROMUCOSAL | Status: AC
  Administered 2020-04-20: 15 mL via OROMUCOSAL

## 2020-04-20 NOTE — Telephone Encounter (Signed)
Pt had surgery with Dr Laural Golden this morning. Pts cath had not been working and they found a stone in it according to wife. Dr Laural Golden gave him an antibiotic that would last 24 hours and asked pt to call us to see what to do from here. Pt cant take Cipro.

## 2020-04-20 NOTE — Telephone Encounter (Signed)
Pls review.

## 2020-04-20 NOTE — Transfer of Care (Signed)
Immediate Anesthesia Transfer of Care Note  Patient: Jordan Ward  Procedure(s) Performed: ENDOSCOPIC RETROGRADE CHOLANGIOPANCREATOGRAPHY (ERCP) (N/A ) STENT REMOVAL (N/A ) REMOVAL OF STONES (N/A )  Patient Location: PACU  Anesthesia Type:General  Level of Consciousness: awake, alert , oriented and patient cooperative  Airway & Oxygen Therapy: Patient Spontanous Breathing and Patient connected to nasal cannula oxygen  Post-op Assessment: Report given to RN, Post -op Vital signs reviewed and stable and Patient moving all extremities  Post vital signs: Reviewed and stable  Last Vitals:  Vitals Value Taken Time  BP 175/78 04/20/20 0926  Temp 36.1 C 04/20/20 0926  Pulse 72 04/20/20 0928  Resp 17 04/20/20 0928  SpO2 100 % 04/20/20 0928  Vitals shown include unvalidated device data.  Last Pain:  Vitals:   04/20/20 0745  TempSrc:   PainSc: 10-Worst pain ever         Complications: No complications documented.

## 2020-04-20 NOTE — OR Nursing (Signed)
Patient stated that his catheter was not working correctly. He said it was leaking and the urine is not coming out appropiately. Complaining of pain.  Rated pain 10.  Dr. Jeffie Pollock, urology , notified and he will see the patient in preop. Patient received pain med per anesthesia order. Stated that he was burning but pain was a little bit better.  ERCP is on hold until patient can be seen , evaluated by urologist. Dr. Laural Golden and Dr. Christia Reading is aware.

## 2020-04-20 NOTE — Telephone Encounter (Signed)
Sending task to Dr. Keturah Barre. To advise

## 2020-04-20 NOTE — H&P (Addendum)
Jordan Ward is an 79 y.o. male.   Chief Complaint: Patient is here for ERCP to remove biliary stent and stone fragments. HPI: Patient is 79 year old Caucasian male with multiple medical problems including CAD coronary artery disease as well as history of CVA who underwent ERCP for choledocholithiasis on 03/03/2020.  He was found to have large calcified stone.  Mechanical lithotripsy was performed.  His duct was markedly dilated.  Some of the stone fragments were removed but not all.  Therefore biliary stent was left in place.  He has not had any abdominal pain nausea or vomiting.  He was seen in the office 1 month ago and doing well.  He says his appetite is fair.  He has not lost any weight since his ERCP.  Today he is complaining of suprapubic burning.  He says urine is coming around the [facility] catheter.  He is to have a change next week.  He is under care of Dr. [name].  He denies fever chills chest pain or shortness of breath.  He does not take any NSAIDs or anticoagulants.  Past Medical History:  Diagnosis Date  . Anemia   . Arthritis   . Back pain, chronic   . Bilateral carotid bruits   . C. difficile colitis 09/2011  . CAD (coronary artery disease)   . Carotid artery stenosis   . Cerebrovascular disease   . Cerebrovascular disease 08/17/2018  . Colon polyps   . Dyslipidemia   . Dysphagia 10/07/2011  . Encephalopathy   . Gait disorder   . HA (headache)   . High grade dysplasia in colonic adenoma 09/2005  . History of kidney stones   . HTN (hypertension), malignant 10/06/2011  . Hypernatremia   . Hypokalemia   . Hypothyroidism 10/08/2011  . Insomnia   . Junctional rhythm   . Kidney stones   . MS (multiple sclerosis) ([place])   . Neuromuscular disorder (HCC)    MS  . OSA (obstructive sleep apnea)   . Paroxysmal atrial tachycardia ([place])   . Peripheral vascular disease ([place])   . Pneumonia 4 yrs ago  . Pulmonary fibrosis ([place]) 10/06/2011  . Pulmonary nodule 10/08/2011  . PVD (peripheral  vascular disease) ([place])   . Sacral ulcer ([place])   . Sleep apnea    cannot tolerate  . Stroke [facility])    left sided weakness  . Suprapubic catheter ([place])   . TIA (transient ischemic attack)   . Tremors of nervous system 10/08/2011  . Urinary tract infection   . Ventral hernia without obstruction or gangrene     Large 8X9cm ventral hernia with loss of domain. CT reads report as diastasis recti with herniation or diastasis recti.  Dr. Constance Haw, Surgery, reviewed CT with radiology and there is herniation with only hernia sac or peritoneum over the bowel and large separation of the rectus muslce (i.e. diastasis recti aka loss of domain).  No surgical intervention recommended given size, age, and health.     Past Surgical History:  Procedure Laterality Date  . APPENDECTOMY  09/2005   at time of left hemicolectomy  . BACK SURGERY  1976/1979   lower  . BILIARY DILATION N/A 03/03/2020   Procedure: BILIARY DILATION;  Surgeon: Rogene Houston, MD;  Location: [facility];  Service: Endoscopy;  Laterality: N/A;  . CATARACT EXTRACTION W/PHACO Right 03/08/2018   Procedure: CATARACT EXTRACTION PHACO AND INTRAOCULAR LENS PLACEMENT RIGHT EYE;  Surgeon: Tonny Branch, MD;  Location: [facility] ORS;  Service: Ophthalmology;  Laterality: Right;  CDE: 8.86  . CATARACT EXTRACTION W/PHACO Left 04/05/2018   Procedure: CATARACT EXTRACTION PHACO AND INTRAOCULAR LENS PLACEMENT (IOC);  Surgeon: Tonny Branch, MD;  Location: [facility] ORS;  Service: Ophthalmology;  Laterality: Left;  CDE: 7.36  . CHOLECYSTECTOMY     Dr. Tamala Julian  . COLON SURGERY  09/2005   Fleishman: four tubular adenomas, large adenomatous polyp with HIGH GRADE dysplasia  . COLONOSCOPY  11/2004   Dr. Sharol Roussel sessile polyp splenic flexure, 13mm sessile polyp desc colon, tubulovillous adenoma (bx not removed)  . COLONOSCOPY  01/2005   poor prep, polyp could not be found  . COLONOSCOPY  05/2005   with EMR, polypectomy Dr. Olegario Messier, bx showed high grade dysplasia,  partially resected  . COLONOSCOPY  09/2005   Dr. Arsenio Loader, [place] ink tattooing, four villous colon polyp (3 had been missed on previous colonoscopies due to limitations of procedures  . COLONOSCOPY  09/2006   normal TI, no polyps  . COLONOSCOPY  10/2007   Dr. Imogene Burn distal mammillations, benign bx, normal TI, random bx neg for microscopic colitis  . CYSTOSCOPY WITH LITHOLAPAXY N/A 07/27/2018   Procedure: CYSTOSCOPY WITH LITHOLAPAXY VIA  SUPRAPUBIC TUBE;  Surgeon: Franchot Gallo, MD;  Location: [facility] OR[facility];  Service: Urology;  Laterality: N/A;  . CYSTOSCOPY WITH RETROGRADE PYELOGRAM, URETEROSCOPY AND STENT PLACEMENT Left 06/09/2017   Procedure: CYSTOSCOPY WITH LEFT RETROGRADE PYELOGRAM, LEFT URETEROSCOPY, LEFT URETEROSCOPIC STONE EXTRACTION, LEFT URETERAL STENT PLACEMENT;  Surgeon: Franchot Gallo, MD;  Location: [facility];  Service: Urology;  Laterality: Left;  . ERCP N/A 03/03/2020   Procedure: ENDOSCOPIC RETROGRADE CHOLANGIOPANCREATOGRAPHY (ERCP);  Surgeon: Rogene Houston, MD;  Location[facility];  Service: Endoscopy;  Laterality: N/A;  . INGUINAL HERNIA REPAIR  1971   bilateral  . INSERTION OF SUPRAPUBIC CATHETER  06/09/2017   Procedure: EXCHANGE OF SUPRAPUBIC CATHETER;  Surgeon: Franchot Gallo, MD;  Location: [facility];  Service: Urology;;  . IR NEPHROSTOMY PLACEMENT LEFT  05/26/2017  . KIDNEY STONE SURGERY  09/13/2015  . LITHOTRIPSY N/A 03/03/2020   Procedure: MECHANICAL LITHOTRIPSY WITH REMOVAL OF MULTIPLE STONE FRAGMENTS;  Surgeon: Rogene Houston, MD;  [facility];  Service: Endoscopy;  Laterality: N/A;  . NEPHROLITHOTOMY Left 09/13/2015   Procedure: LEFT PERCUTANEOUS NEPHROLITHOTOMY ;  Surgeon: Franchot Gallo, MD;  Location: [facility];  Service: Urology;  Laterality: Left;  . SPHINCTEROTOMY  03/03/2020   Procedure: BILLARY SPHINCTEROTOMY;  Surgeon: Rogene Houston, MD;  Location[facility];  Service: Endoscopy;;  . SUPRAPUBIC CATHETER INSERTION      Family  History  Problem Relation Age of Onset  . Cirrhosis Brother        etoh  . Stroke Mother 71  . Coronary artery disease Father 28  . Heart attack Brother   . Cancer Sister   . Multiple sclerosis Other   . Colon cancer Neg Hx    Social History:  reports that he quit smoking about 32 years ago. His smoking use included cigarettes. He has a 25.00 pack-year smoking history. He has never used smokeless tobacco. He reports that he does not drink alcohol and does not use drugs.  Allergies:  Allergies  Allergen Reactions  . Tetracyclines & Related Anaphylaxis and Rash  . Ciprofloxacin     Trouble swallowing unknown reaction according to wife     Medications Prior to Admission  Medication Sig Dispense Refill  . amLODipine (NORVASC) 5 MG tablet Take 5 mg by mouth daily.     Marland Kitchen  carvedilol (COREG) 6.25 MG tablet Take 6.25 mg by mouth 2 (two) times daily with a meal.    . HYDROcodone-acetaminophen (NORCO) 7.5-325 MG per tablet Take 1 tablet by mouth 2 (two) times daily. Max APAP 3 GM IN 24 HOURS FROM ALL SOURCES (Patient taking differently: Take 1 tablet by mouth in the morning and at bedtime. ALL MEDICATIONS TO BE CRUSHED AND PLACED IN APPLESAUCE) 60 tablet 0  . levothyroxine (SYNTHROID) 75 MCG tablet Take 75 mcg by mouth daily before breakfast. ALL MEDICATIONS TO BE CRUSHED AND PLACED IN APPLESAUCE    . mirabegron ER (MYRBETRIQ) 25 MG TB24 tablet Take 25 mg by mouth daily as needed (bladder spasms).    . diazepam (VALIUM) 5 MG tablet TAKE (1) TABLET BY MOUTH TWICE DAILY. (Patient not taking: Reported on 04/05/2020) 60 tablet 5    Results for orders placed or performed during the hospital encounter of 04/18/20 (from the past 48 hour(s))  SARS CORONAVIRUS 2 (TAT 6-24 HRS) Nasopharyngeal Nasopharyngeal Swab     Status: None   Collection Time: 04/18/20 12:38 PM   Specimen: Nasopharyngeal Swab  Result Value Ref Range   SARS Coronavirus 2 NEGATIVE NEGATIVE    Comment: (NOTE) SARS-CoV-2 target  nucleic acids are NOT DETECTED.  The SARS-CoV-2 RNA is generally detectable in upper and lower respiratory specimens during the acute phase of infection. Negative results do not preclude SARS-CoV-2 infection, do not rule out co-infections with other pathogens, and should not be used as the sole basis for treatment or other patient management decisions. Negative results must be combined with clinical observations, patient history, and epidemiological information. The expected result is Negative.  Fact Sheet for Patients: SugarRoll.be  Fact Sheet for Healthcare Providers: https://www.woods-mathews.com/  This test is not yet approved or cleared by the [place] FDA and  has been authorized for detection and/or diagnosis of SARS-CoV-2 by FDA under an Emergency Use Authorization (EUA). This EUA will remain  in effect (meaning this test can be used) for the duration of the COVID-19 declaration under Se ction 564(b)(1) of the Act, 21 U.S.C. section 360bbb-3(b)(1), unless the authorization is terminated or revoked sooner.  Performed at [facility], [place] [address], [place], [place] [address]   CBC with Differential/Platelet     Status: Abnormal   Collection Time: 04/18/20 12:58 PM  Result Value Ref Range   WBC 5.9 4.0 - 10.5 K/uL   RBC 4.14 (L) 4.22 - 5.81 MIL/uL   Hemoglobin 12.9 (L) 13.0 - 17.0 g/dL   HCT 40.2 39 - 52 %   MCV 97.1 80.0 - 100.0 fL   MCH 31.2 26.0 - 34.0 pg   MCHC 32.1 30.0 - 36.0 g/dL   RDW 13.3 11.5 - 15.5 %   Platelets 166 150 - 400 K/uL   nRBC 0.0 0.0 - 0.2 %   Neutrophils Relative % 51 %   Neutro Abs 3.0 1.7 - 7.7 K/uL   Lymphocytes Relative 35 %   Lymphs Abs 2.1 0.7 - 4.0 K/uL   Monocytes Relative 7 %   Monocytes Absolute 0.4 0 - 1 K/uL   Eosinophils Relative 6 %   Eosinophils Absolute 0.4 0 - 0 K/uL   Basophils Relative 1 %   Basophils Absolute 0.1 0 - 0 K/uL   Immature Granulocytes 0 %   Abs  Immature Granulocytes 0.01 0.00 - 0.07 K/uL    Comment: Performed at [facility], [address], [place], [place] [address]  Comprehensive metabolic panel  Status: Abnormal   Collection Time: 04/18/20 12:58 PM  Result Value Ref Range   Sodium 141 135 - 145 mmol/L   Potassium 3.5 3.5 - 5.1 mmol/L   Chloride 101 98 - 111 mmol/L   CO2 26 22 - 32 mmol/L   Glucose, Bld 99 70 - 99 mg/dL    Comment: Glucose reference range applies only to samples taken after fasting for at least 8 hours.   BUN 27 (H) 8 - 23 mg/dL   Creatinine, Ser 1.60 (H) 0.61 - 1.24 mg/dL   Calcium 9.3 8.9 - 10.3 mg/dL   Total Protein 7.4 6.5 - 8.1 g/dL   Albumin 4.1 3.5 - 5.0 g/dL   AST 18 15 - 41 U/L   ALT 13 0 - 44 U/L   Alkaline Phosphatase 85 38 - 126 U/L   Total Bilirubin 0.7 0.3 - 1.2 mg/dL   GFR calc non Af Amer 41 (L) >60 mL/min   GFR calc Af Amer 47 (L) >60 mL/min   Anion gap 14 5 - 15    Comment: Performed at [facility], [address] [place], [place] [address]   DG Abd 1 View - KUB  Result Date: 04/19/2020 CLINICAL DATA:  Follow-up common bile duct stent EXAM: ABDOMEN - 1 VIEW COMPARISON:  03/03/2020 FINDINGS: Scattered large and small bowel gas is noted. No obstructive changes are seen. Biliary stent is again seen in the right upper quadrant. Pneumobilia is seen related to the stent. No bony abnormality is noted. IMPRESSION: Biliary stent in place with associated pneumobilia. Electronically Signed   By: Inez Catalina M.D.   On: 04/19/2020 16:46    Review of Systems  Blood pressure (!) 126/91, pulse 62, temperature 98.6 F (37 C), temperature source Oral, resp. rate 16, SpO2 100 %. Physical Exam Constitutional:      Comments: Patient is thin Caucasian male in no acute distress.  HENT:     Mouth/Throat:     Mouth: Mucous membranes are moist.     Pharynx: Oropharynx is clear.  Eyes:     General: No scleral icterus.    Conjunctiva/sclera: Conjunctivae normal.  Cardiovascular:     Rate and  Rhythm: Normal rate and regular rhythm.     Heart sounds: Normal heart sounds. No murmur heard.   Pulmonary:     Effort: Pulmonary effort is normal.     Breath sounds: Normal breath sounds.  Abdominal:     Comments: Abdomen is flat.  He has suprapubic Foley's catheter in place.  Site appears healthy.  On palpation abdomen is soft and nontender with organomegaly or masses.  Musculoskeletal:        General: No swelling.     Cervical back: Neck supple.  Lymphadenopathy:     Cervical: No cervical adenopathy.  Skin:    General: Skin is warm and dry.   Lab data from 04/18/2020 reviewed.  LFTs are normal.  Serum creatinine is 1.6. Biliary stent in place.   Assessment/Plan  History of choledocholithiasis. ERCP with removal of biliary stent and remaining stone fragments. He is having suprapubic burning.  Will try to change his Foley's catheter before or after the procedure.  Hildred Laser, MD 04/20/2020, 7:28 AM  Addendum. Patient says suprapubic burning started 2 days ago.  He has not contacted Dr. Alan Ripper office.  He describes it to be intense.  He states urine is dribbling why her urethra. Suprapubic Foley's catheter has occluded. Dr. Jeffie Pollock will see patient and change catheter before  proceeding with ERCP.

## 2020-04-20 NOTE — Anesthesia Preprocedure Evaluation (Addendum)
Anesthesia Evaluation  Patient identified by MRN, date of birth, ID band Patient awake    Reviewed: Allergy & Precautions, NPO status , Patient's Chart, lab work & pertinent test results, reviewed documented beta blocker date and time   Airway Mallampati: II  TM Distance: >3 FB Neck ROM: Full    Dental  (+) Poor Dentition, Dental Advisory Given, Missing   Pulmonary sleep apnea , pneumonia, former smoker,    Pulmonary exam normal breath sounds clear to auscultation       Cardiovascular Exercise Tolerance: Poor hypertension, Pt. on medications and Pt. on home beta blockers + CAD, + Past MI, + Peripheral Vascular Disease and +CHF  Normal cardiovascular exam+ dysrhythmias (PAT)  Rhythm:Regular Rate:Normal  Left ventricle: The cavity size was normal. Arenson thickness was increased in a pattern of mild LVH. Systolic function was normal.  The estimated ejection fraction was in the range of 50% to 55%.  There is hypokinesis of the basalinferior myocardium. Doppler parameters are consistent with abnormal left ventricular relaxation (grade 1 diastolic dysfunction).  - Aortic valve: A bicuspid morphology cannot be excluded. Moderate  thickening and calcification involving the noncoronary cusp.  Valve mobility was restricted. Mean gradient (S): 4 mm Hg. Peak  gradient (S): 10 mm Hg. VTI ratio of LVOT to aortic valve: 0.61.  Valve area (Vmax): 1.98 cm^2.  - Mitral valve: There was mild regurgitation.  - Left atrium: The atrium was mildly dilated.  - Right atrium: Central venous pressure (est): 3 mm Hg.  - Atrial septum: No defect or patent foramen ovale was identified.  - Tricuspid valve: There was mild regurgitation.  - Pulmonary arteries: PA peak pressure: 39 mm Hg (S).  - Pericardium, extracardiac: There was no pericardial effusion.  22-Jul-2018 13:57:42 [place] [facility]-300 ROUTINE RECORD Sinus bradycardia Left  ventricular hypertrophy with repolarization abnormality Abnormal ECG Confirmed by Sinda Du ([id number]) on 07/26/2018 12:31:27 PM    Neuro/Psych  Headaches, PSYCHIATRIC DISORDERS TIA Neuromuscular disease (multiple sclerosis) CVA, Residual Symptoms    GI/Hepatic negative GI ROS, CBD stone, S/P ERCP   Endo/Other  Hypothyroidism   Renal/GU ARF and Renal InsufficiencyRenal disease     Musculoskeletal  (+) Arthritis ,   Abdominal   Peds  Hematology  (+) anemia ,   Anesthesia Other Findings 18-Apr-2020 13:03:26 [place] [facility]-OPS ROUTINE RECORD Normal sinus rhythm Left ventricular hypertrophy with QRS widening and repolarization abnormality ( Sokolow-Lyon , Romhilt-Estes ) Cannot rule out Inferior infarct , age undetermined Abnormal ECG Confirmed by Sherren Mocha [phone]) on 04/19/2020 7:12:00 AM  Reproductive/Obstetrics                            Anesthesia Physical  Anesthesia Plan  ASA: IV  Anesthesia Plan: General   Post-op Pain Management:    Induction: Intravenous  PONV Risk Score and Plan: 3  Airway Management Planned: Oral ETT  Additional Equipment:   Intra-op Plan:   Post-operative Plan: Extubation in OR  Informed Consent: I have reviewed the patients History and Physical, chart, labs and discussed the procedure including the risks, benefits and alternatives for the proposed anesthesia with the patient or authorized representative who has indicated his/her understanding and acceptance.     Dental advisory given  Plan Discussed with: Surgeon  Anesthesia Plan Comments:        Anesthesia Quick Evaluation

## 2020-04-20 NOTE — Consult Note (Signed)
Subjective: 1. Common bile duct calculi   2. Common bile duct stone   3.      Complication of cystostomy, subsequent encounter.   Jordan Ward is a 79 yo WM with MS and a NGB with retention managed by SP tube which is changed in the office q2-3wks.  He presented for a GI procedure today and noted that the tube wasn't draining and he was having leakage around the catheter and pain.  Attempts at irrigation were unsuccessful.  ROS:  Review of Systems  Gastrointestinal: Positive for abdominal pain.  All other systems reviewed and are negative.   Allergies  Allergen Reactions  . Tetracyclines & Related Anaphylaxis and Rash  . Ciprofloxacin     Trouble swallowing unknown reaction according to wife     Past Medical History:  Diagnosis Date  . Anemia   . Arthritis   . Back pain, chronic   . Bilateral carotid bruits   . C. difficile colitis 09/2011  . CAD (coronary artery disease)   . Carotid artery stenosis   . Cerebrovascular disease   . Cerebrovascular disease 08/17/2018  . Colon polyps   . Complication of cystostomy catheter, initial encounter ([place]) 04/20/2020  . Dyslipidemia   . Dysphagia 10/07/2011  . Encephalopathy   . Gait disorder   . HA (headache)   . High grade dysplasia in colonic adenoma 09/2005  . History of kidney stones   . HTN (hypertension), malignant 10/06/2011  . Hypernatremia   . Hypokalemia   . Hypothyroidism 10/08/2011  . Insomnia   . Junctional rhythm   . Kidney stones   . MS (multiple sclerosis) ([place])   . Neuromuscular disorder (H[facility])    MS  . OSA (obstructive sleep apnea)   . Paroxysmal atrial tachycardia ([place])   . Peripheral vascular disease ([place])   . Pneumonia 4 yrs ago  . Pulmonary fibrosis ([place]) 10/06/2011  . Pulmonary nodule 10/08/2011  . PVD (peripheral vascular disease) ([place])   . Sacral ulcer ([place])   . Sleep apnea    cannot tolerate  . Stroke [facility])    left sided weakness  . Suprapubic catheter ([place])   . TIA (transient ischemic attack)   .  Tremors of nervous system 10/08/2011  . Urinary tract infection   . Ventral hernia without obstruction or gangrene     Large 8X9cm ventral hernia with loss of domain. CT reads report as diastasis recti with herniation or diastasis recti.  Dr. Constance Haw, Surgery, reviewed CT with radiology and there is herniation with only hernia sac or peritoneum over the bowel and large separation of the rectus muslce (i.e. diastasis recti aka loss of domain).  No surgical intervention recommended given size, age, and health.     Past Surgical History:  Procedure Laterality Date  . APPENDECTOMY  09/2005   at time of left hemicolectomy  . BACK SURGERY  1976/1979   lower  . BILIARY DILATION N/A 03/03/2020   Procedure: BILIARY DILATION;  Surgeon: Rogene Houston, MD;  [facility];  Service: Endoscopy;  Laterality: N/A;  . CATARACT EXTRACTION W/PHACO Right 03/08/2018   Procedure: CATARACT EXTRACTION PHACO AND INTRAOCULAR LENS PLACEMENT RIGHT EYE;  Surgeon: Tonny Branch, MD;  Location: [facility] ORS;  Service: Ophthalmology;  Laterality: Right;  CDE: 8.86  . CATARACT EXTRACTION W/PHACO Left 04/05/2018   Procedure: CATARACT EXTRACTION PHACO AND INTRAOCULAR LENS PLACEMENT (IOC);  Surgeon: Tonny Branch, MD;  Location: [facility] ORS;  Service: Ophthalmology;  Laterality: Left;  CDE: 7.36  .  CHOLECYSTECTOMY     Dr. Tamala Julian  . COLON SURGERY  09/2005   [place]man: four tubular adenomas, large adenomatous polyp with HIGH GRADE dysplasia  . COLONOSCOPY  11/2004   Dr. Sharol Roussel sessile polyp splenic flexure, 66mm sessile polyp desc colon, tubulovillous adenoma (bx not removed)  . COLONOSCOPY  01/2005   poor prep, polyp could not be found  . COLONOSCOPY  05/2005   with EMR, polypectomy Dr. Olegario Messier, bx showed high grade dysplasia, partially resected  . COLONOSCOPY  09/2005   Dr. Arsenio Loader, [place] ink tattooing, four villous colon polyp (3 had been missed on previous colonoscopies due to limitations of procedures  . COLONOSCOPY   09/2006   normal TI, no polyps  . COLONOSCOPY  10/2007   Dr. Imogene Burn distal mammillations, benign bx, normal TI, random bx neg for microscopic colitis  . CYSTOSCOPY WITH LITHOLAPAXY N/A 07/27/2018   Procedure: CYSTOSCOPY WITH LITHOLAPAXY VIA  SUPRAPUBIC TUBE;  Surgeon: Franchot Gallo, MD;  Location: [facility];  Service: Urology;  Laterality: N/A;  . CYSTOSCOPY WITH RETROGRADE PYELOGRAM, URETEROSCOPY AND STENT PLACEMENT Left 06/09/2017   Procedure: CYSTOSCOPY WITH LEFT RETROGRADE PYELOGRAM, LEFT URETEROSCOPY, LEFT URETEROSCOPIC STONE EXTRACTION, LEFT URETERAL STENT PLACEMENT;  Surgeon: Franchot Gallo, MD;  Location: [facility];  Service: Urology;  Laterality: Left;  . ERCP N/A 03/03/2020   Procedure: ENDOSCOPIC RETROGRADE CHOLANGIOPANCREATOGRAPHY (ERCP);  Surgeon: Rogene Houston, MD;  [facility];  Service: Endoscopy;  Laterality: N/A;  . INGUINAL HERNIA REPAIR  1971   bilateral  . INSERTION OF SUPRAPUBIC CATHETER  06/09/2017   Procedure: EXCHANGE OF SUPRAPUBIC CATHETER;  Surgeon: Franchot Gallo, MD;  Location: [facility];  Service: Urology;;  . IR NEPHROSTOMY PLACEMENT LEFT  05/26/2017  . KIDNEY STONE SURGERY  09/13/2015  . LITHOTRIPSY N/A 03/03/2020   Procedure: MECHANICAL LITHOTRIPSY WITH REMOVAL OF MULTIPLE STONE FRAGMENTS;  Surgeon: Rogene Houston, MD;  [facility];  Service: Endoscopy;  Laterality: N/A;  . NEPHROLITHOTOMY Left 09/13/2015   Procedure: LEFT PERCUTANEOUS NEPHROLITHOTOMY ;  Surgeon: Franchot Gallo, MD;  Location: [facility];  Service: Urology;  Laterality: Left;  . SPHINCTEROTOMY  03/03/2020   Procedure: BILLARY SPHINCTEROTOMY;  Surgeon: Rogene Houston, MD;  [facility];  Service: Endoscopy;;  . SUPRAPUBIC CATHETER INSERTION      Social History   Socioeconomic History  . Marital status: Married    Spouse name: Pricilla Holm  . Number of children: 2  . Years of education: 10  . Highest education level: Not on file  Occupational History   . Occupation: disability    Employer: RETIRED  Tobacco Use  . Smoking status: Former Smoker    Packs/day: 1.00    Years: 25.00    Pack years: 25.00    Types: Cigarettes    Quit date: 03/03/1988    Years since quitting: 32.1  . Smokeless tobacco: Never Used  Vaping Use  . Vaping Use: Never used  Substance and Sexual Activity  . Alcohol use: No  . Drug use: No  . Sexual activity: Never  Other Topics Concern  . Not on file  Social History Narrative   Patient is married Pricilla Holm) and  Lives w/ wife.   Patient is retired.   Patient has a 10th grade education.   Patient has two children.      Patient drinks about 1-2 sodas daily.   Patient is left handed.    Social Determinants of Health   Financial Resource Strain:   .  Difficulty of Paying Living Expenses:   Food Insecurity:   . Worried About Charity fundraiser in the Last Year:   . Arboriculturist in the Last Year:   Transportation Needs:   . Film/video editor (Medical):   Marland Kitchen Lack of Transportation (Non-Medical):   Physical Activity:   . Days of Exercise per Week:   . Minutes of Exercise per Session:   Stress:   . Feeling of Stress :   Social Connections:   . Frequency of Communication with Friends and Family:   . Frequency of Social Gatherings with Friends and Family:   . Attends Religious Services:   . Active Member of Clubs or Organizations:   . Attends Archivist Meetings:   Marland Kitchen Marital Status:   Intimate Partner Violence:   . Fear of Current or Ex-Partner:   . Emotionally Abused:   Marland Kitchen Physically Abused:   . Sexually Abused:     Family History  Problem Relation Age of Onset  . Cirrhosis Brother        etoh  . Stroke Mother 72  . Coronary artery disease Father 23  . Heart attack Brother   . Cancer Sister   . Multiple sclerosis Other   . Colon cancer Neg Hx     Anti-infectives: Anti-infectives (From admission, onward)   Start     Dose/Rate Route Frequency Ordered Stop   04/20/20 0715   ceFAZolin (ANCEF) IVPB 2g/100 mL premix     Discontinue     2 g 200 mL/hr over 30 Minutes Intravenous On call to O.R. 04/20/20 0706 04/21/20 0559      Current Facility-Administered Medications  Medication Dose Route Frequency Provider Last Rate Last Admin  . ceFAZolin (ANCEF) IVPB 2g/100 mL premix  2 g Intravenous On Call to OR Rogene Houston, MD   2 g at 04/20/20 0839  . Chlorhexidine Gluconate Cloth 2 % PADS 6 each  6 each Topical Once Rehman, Mechele Dawley, MD       And  . Chlorhexidine Gluconate Cloth 2 % PADS 6 each  6 each Topical Once Rehman, Mechele Dawley, MD       Facility-Administered Medications Ordered in Other Encounters  Medication Dose Route Frequency Provider Last Rate Last Admin  . ePHEDrine injection   Intravenous Anesthesia Intra-op Tacy Learn, CRNA   10 mg at 04/20/20 0846  . propofol (DIPRIVAN) 10 mg/mL bolus/IV push   Intravenous Anesthesia Intra-op Tacy Learn, CRNA   60 mg at 04/20/20 0834     Objective: Vital signs in last 24 hours: BP (!) 126/91   Pulse 62   Temp 98.6 F (37 C) (Oral)   Resp 16   SpO2 100%   Intake/Output from previous day: No intake/output data recorded. Intake/Output this shift: Total I/O In: 100 [IV Piggyback:100] Out: -    Physical Exam Vitals reviewed.  Constitutional:      Appearance: Normal appearance.  Abdominal:     Comments: SP tube sight is clean without inflammation.  There is lower abdominal fullness and tenderness.   Neurological:     Mental Status: He is alert.     Lab Results:  No results found for this or any previous visit (from the past 24 hour(s)).  BMET Recent Labs    04/18/20 1258  NA 141  K 3.5  CL 101  CO2 26  GLUCOSE 99  BUN 27*  CREATININE 1.60*  CALCIUM 9.3   PT/INR No results  for input(s): LABPROT, INR in the last 72 hours. ABG No results for input(s): PHART, HCO3 in the last 72 hours.  Invalid input(s): PCO2, PO2  Studies/Results: DG Abd 1 View - KUB  Result Date:  04/19/2020 CLINICAL DATA:  Follow-up common bile duct stent EXAM: ABDOMEN - 1 VIEW COMPARISON:  03/03/2020 FINDINGS: Scattered large and small bowel gas is noted. No obstructive changes are seen. Biliary stent is again seen in the right upper quadrant. Pneumobilia is seen related to the stent. No bony abnormality is noted. IMPRESSION: Biliary stent in place with associated pneumobilia. Electronically Signed   By: Inez Catalina M.D.   On: 04/19/2020 16:46     Assessment/Plan: Complication of cystostomy catheter, subsequent encounter Mr. Blowe SP tube was obstructed.  The tube was removed with slight resistance and replaced with a fresh 5fr foley using sterile technique.  The balloon was filled with 57ml of sterile water.  There was no bleeding or complication and urine was quite clear.     He will need to reschedule his f/u next week with Dr. Alyson Ingles and I have notified the office.          No follow-ups on file.    CC: Dr. Hildred Laser.      Irine Seal 04/20/2020 [phone]

## 2020-04-20 NOTE — Anesthesia Procedure Notes (Signed)
Procedure Name: Intubation Performed by: Tacy Learn, CRNA Pre-anesthesia Checklist: Patient identified, Emergency Drugs available, Suction available, Patient being monitored and Timeout performed Patient Re-evaluated:Patient Re-evaluated prior to induction Oxygen Delivery Method: Circle system utilized Preoxygenation: Pre-oxygenation with 100% oxygen Induction Type: IV induction Laryngoscope Size: Miller and 3 Grade View: Grade I Number of attempts: 1 Airway Equipment and Method: Stylet Placement Confirmation: ETT inserted through vocal cords under direct vision,  positive ETCO2,  CO2 detector and breath sounds checked- equal and bilateral Secured at: 22 cm Tube secured with: Tape Dental Injury: Teeth and Oropharynx as per pre-operative assessment

## 2020-04-20 NOTE — Telephone Encounter (Signed)
Attempted to return wifes call. No answer. No way to leavr message. Task was sent to Dr. [name] to advise.

## 2020-04-20 NOTE — Progress Notes (Addendum)
Brief ERCP note.  Large periampullary duodenal diverticulum. Biliary stent in place. Stent was removed with polypectomy snare under fluoroscopic control. Stent partially occluded. Cholangiogram revealed dilated CBD and CHD with filling defects felt to be air bubbles. Prior sphincterotomy dilated to 11 mm with the balloon. Minimal oozing noted post dilation. 2 stone fragments removed with Dormia basket. 12 mm balloon passed through the docs and ampulla. No remaining stones.

## 2020-04-20 NOTE — Progress Notes (Signed)
Dr Jeffie Pollock at bedside to check pt. #24 supra pubic catheter changed. Catheter draining clear yellow urine. Supra-pubic catheter to straight drainage. Catheter secured to right thigh. Fentanyl 25 mcg IV given  for pain. Tolerated well.

## 2020-04-20 NOTE — Assessment & Plan Note (Signed)
Mr. Puertas SP tube was obstructed.  The tube was removed with slight resistance and replaced with a fresh 13fr foley using sterile technique.  The balloon was filled with 30ml of sterile water.  There was no bleeding or complication and urine was quite clear.     He will need to reschedule his f/u next week with Dr. Alyson Ingles and I have notified the office.

## 2020-04-20 NOTE — Telephone Encounter (Signed)
Pt wife made aware of new appt.date and time

## 2020-04-20 NOTE — Op Note (Signed)
[facility] Patient Name: Jordan Ward Procedure Date: 04/20/2020 7:20 AM MRN: [id number] Date of Birth: 1941/07/06 Attending MD: Hildred Laser , MD CSN: [id number] Age: 79 Admit Type: Outpatient Procedure:                ERCP Indications:              Bile duct stone(s), Follow-up of bile duct                            stone(s), For therapy of bile duct stone(s),                            Biliary stent removal Providers:                Hildred Laser, MD, Otis Peak B. Sharon Seller, RN, Nelma Rothman, Technician Referring MD:             Delphina Cahill, MD Medicines:                General Anesthesia Complications:            No immediate complications. Estimated Blood Loss:     Estimated blood loss was minimal. Procedure:                Pre-Anesthesia Assessment:                           - Prior to the procedure, a History and Physical                            was performed, and patient medications and                            allergies were reviewed. The patient's tolerance of                            previous anesthesia was also reviewed. The risks                            and benefits of the procedure and the sedation                            options and risks were discussed with the patient.                            All questions were answered, and informed consent                            was obtained. Prior Anticoagulants: The patient has                            taken no previous anticoagulant or antiplatelet  agents. ASA Grade Assessment: III - A patient with                            severe systemic disease. After reviewing the risks                            and benefits, the patient was deemed in                            satisfactory condition to undergo the procedure.                           After obtaining informed consent, the scope was                            passed under direct vision. Throughout the                             procedure, the patient's blood pressure, pulse, and                            oxygen saturations were monitored continuously. The                            [id number]-Q180V ([id number]) scope was introduced through                            the mouth, and used to inject contrast into and                            used to inject contrast into the bile duct. The                            ERCP was accomplished without difficulty. The                            patient tolerated the procedure well. Scope In: 8:46:56 AM Scope Out: 9:12:55 AM Total Procedure Duration: 0 hours 25 minutes 59 seconds  Findings:      A biliary stent was visible on the scout film. One stent was removed       from the biliary tree using a snare. The stent was found to be partially       occluded via the water column test. The major papilla was on the rim of       a diverticulum. There was a gaping orifice at the major papilla. prior       sphincterotomy, Dilation of the common bile duct with a 07-10-11 mm       balloon (to a maximum balloon size of 11 mm) dilator was successful. The       biliary tree was swept with a 12 mm balloon and basket starting at the       bifurcation. Two stones were removed. No stones remained. Impression:               - One stent was removed from  the biliary tree.                           - The major papilla was on the rim of a                            diverticulum.                           - The major papilla appeared to have a gaping                            orifice secondary to prior sphincterotomy.                           - Sphincterotomy was successfully dilated before                            removing stone fragments(2).                           - Choledocholithiasis was found. Complete removal                            was accomplished by basket and balloon extraction. Moderate Sedation:      Per Anesthesia Care Recommendation:           - Avoid  aspirin and nonsteroidal anti-inflammatory                            medicines today.                           - Clear liquid diet today.                           - Usual diet starting tomorrow morning.                           - Continue present medications.                           - Office visit in 4 weeks. Procedure Code(s):        --- Professional ---                           [phone], Endoscopic retrograde                            cholangiopancreatography (ERCP); with removal of                            foreign body(s) or stent(s) from biliary/pancreatic                            duct(s)  43264, Endoscopic retrograde                            cholangiopancreatography (ERCP); with removal of                            calculi/debris from biliary/pancreatic duct(s) Diagnosis Code(s):        --- Professional ---                           K80.50, Calculus of bile duct without cholangitis                            or cholecystitis without obstruction                           Z46.59, Encounter for fitting and adjustment of                            other gastrointestinal appliance and device                           K83.8, Other specified diseases of biliary tract CPT copyright 2019 American Medical Association. All rights reserved. The codes documented in this report are preliminary and upon coder review may  be revised to meet current compliance requirements. Hildred Laser, MD Hildred Laser, MD 04/20/2020 9:39:09 AM This report has been signed electronically. Number of Addenda: 0

## 2020-04-20 NOTE — Anesthesia Postprocedure Evaluation (Signed)
Anesthesia Post Note  Patient: Jordan Ward  Procedure(s) Performed: ENDOSCOPIC RETROGRADE CHOLANGIOPANCREATOGRAPHY (ERCP) (N/A ) STENT REMOVAL (N/A ) REMOVAL OF STONES (N/A )  Patient location during evaluation: PACU Anesthesia Type: General Level of consciousness: awake, oriented, awake and alert and patient cooperative Pain management: pain level controlled Vital Signs Assessment: post-procedure vital signs reviewed and stable Respiratory status: spontaneous breathing, respiratory function stable, nonlabored ventilation and patient connected to nasal cannula oxygen Cardiovascular status: stable and blood pressure returned to baseline Postop Assessment: no headache and no backache Anesthetic complications: no   No complications documented.   Last Vitals:  Vitals:   04/20/20 0658 04/20/20 0926  BP: (!) 126/91 (!) 175/78  Pulse: 62 82  Resp: 16 16  Temp: 37 C (!) 36.1 C  SpO2: 100% 100%    Last Pain:  Vitals:   04/20/20 0745  TempSrc:   PainSc: 10-Worst pain ever                 Tacy Learn

## 2020-04-20 NOTE — Telephone Encounter (Signed)
-----   Message from Irine Seal, MD sent at 04/20/2020  8:36 AM EDT ----- I changed his SP tube today over in preop so his appointment next Tuesday can be rescheduled for whatever interval he normal comes in.

## 2020-04-20 NOTE — Discharge Instructions (Signed)
Endoscopic Retrograde Cholangiopancreatogram, Care After This sheet gives you information about how to care for yourself after your procedure. Your health care provider may also give you more specific instructions. If you have problems or questions, contact your health care provider. What can I expect after the procedure? After the procedure, it is common to have:  Soreness in your throat.  Nausea.  Bloating.  Dizziness.  Tiredness (fatigue). Follow these instructions at home:   Take over-the-counter and prescription medicines only as told by your health care provider.  Do not drive for 24 hours if you were given a medicine to help you relax (sedative) during your procedure. Have someone stay with you for 24 hours after the procedure.  Return to your normal activities as told by your health care provider. Ask your health care provider what activities are safe for you.  Return to eating what you normally do as soon as you feel well enough or as told by your health care provider.  Keep all follow-up visits as told by your health care provider. This is important. Contact a health care provider if:  You have pain in your abdomen that does not get better with medicine.  You develop signs of infection, such as: ? Chills. ? Feeling unwell. Get help right away if:  You have difficulty swallowing.  You have worsening pain in your throat, chest, or abdomen.  You vomit bright red blood or a substance that looks like coffee grounds.  You have bloody or very black stools.  You have a fever.  You have a sudden increase in swelling (bloating) in your abdomen. Summary  After the procedure, it is common to feel tired and to have some discomfort in your throat.  Contact your health care provider if you have signs of infection--such as chills or feeling unwell--or if you have pain that does not improve with medicine.  Get help right away if you have trouble swallowing, worsening  pain, bloody or black vomit, bloody or black stools, a fever, or increased swelling in your abdomen.  Keep all follow-up visits as told by your health care provider. This is important. This information is not intended to replace advice given to you by your health care provider. Make sure you discuss any questions you have with your health care provider. Document Revised: 08/28/2017 Document Reviewed: 08/04/2016 Elsevier Patient Education  2020 [place] [facility]est[facility], Adult, Care After This sheet gives you information about how to care for yourself after your procedure. Your health care provider may also give you more specific instructions. If you have problems or questions, contact your health care provider. What can I expect after the procedure? After the procedure, the following side effects are common:  Pain or discomfort at the IV site.  Nausea.  Vomiting.  Sore throat.  Trouble concentrating.  Feeling cold or chills.  Weak or tired.  Sleepiness and fatigue.  Soreness and body aches. These side effects can affect parts of the body that were not involved in surgery. Follow these instructions at home:  For at least 24 hours after the procedure:  Have a responsible adult stay with you. It is important to have someone help care for you until you are awake and alert.  Rest as needed.  Do not: ? Participate in activities in which you could fall or become injured. ? Drive. ? Use heavy machinery. ? Drink alcohol. ? Take sleeping pills or medicines that cause drowsiness. ? Make important decisions or sign  legal documents. ? Take care of children on your own. Eating and drinking  Follow any instructions from your health care provider about eating or drinking restrictions.  When you feel hungry, start by eating small amounts of foods that are soft and easy to digest (bland), such as toast. Gradually return to your regular diet.  Drink enough fluid to keep  your urine pale yellow.  If you vomit, rehydrate by drinking water, juice, or clear broth. General instructions  If you have sleep apnea, surgery and certain medicines can increase your risk for breathing problems. Follow instructions from your health care provider about wearing your sleep device: ? Anytime you are sleeping, including during daytime naps. ? While taking prescription pain medicines, sleeping medicines, or medicines that make you drowsy.  Return to your normal activities as told by your health care provider. Ask your health care provider what activities are safe for you.  Take over-the-counter and prescription medicines only as told by your health care provider.  If you smoke, do not smoke without supervision.  Keep all follow-up visits as told by your health care provider. This is important. Contact a health care provider if:  You have nausea or vomiting that does not get better with medicine.  You cannot eat or drink without vomiting.  You have pain that does not get better with medicine.  You are unable to pass urine.  You develop a skin rash.  You have a fever.  You have redness around your IV site that gets worse. Get help right away if:  You have difficulty breathing.  You have chest pain.  You have blood in your urine or stool, or you vomit blood. Summary  After the procedure, it is common to have a sore throat or nausea. It is also common to feel tired.  Have a responsible adult stay with you for the first 24 hours after general anesthesia. It is important to have someone help care for you until you are awake and alert.  When you feel hungry, start by eating small amounts of foods that are soft and easy to digest (bland), such as toast. Gradually return to your regular diet.  Drink enough fluid to keep your urine pale yellow.  Return to your normal activities as told by your health care provider. Ask your health care provider what activities are  safe for you. This information is not intended to replace advice given to you by your health care provider. Make sure you discuss any questions you have with your health care provider. Document Revised: 09/18/2017 Document Reviewed: 05/01/2017 Elsevier Patient Education  [place].

## 2020-04-23 NOTE — Progress Notes (Signed)
Spoke with wife who stated, patient Jordan Ward, had some dizziness day of procedure at home. She took patients blood pressure which was stable and stated it resolved after eating and resting throughout day.

## 2020-04-24 ENCOUNTER — Ambulatory Visit: Payer: Medicare Other

## 2020-04-24 ENCOUNTER — Encounter (HOSPITAL_COMMUNITY): Payer: Self-pay | Admitting: Internal Medicine

## 2020-04-24 NOTE — Telephone Encounter (Signed)
OK to come in and change cath if needed--I dont think he needs abx unless infection truly present

## 2020-04-25 NOTE — Telephone Encounter (Signed)
Called wife. Reports he had his cath changed in the hospital.states draining without difficulty. Will keep scheduled appt for nv cath change

## 2020-05-02 ENCOUNTER — Observation Stay (HOSPITAL_COMMUNITY): Payer: Medicare Other

## 2020-05-02 ENCOUNTER — Emergency Department (HOSPITAL_COMMUNITY): Payer: Medicare Other

## 2020-05-02 ENCOUNTER — Encounter (HOSPITAL_COMMUNITY): Payer: Self-pay | Admitting: Emergency Medicine

## 2020-05-02 ENCOUNTER — Other Ambulatory Visit: Payer: Self-pay

## 2020-05-02 ENCOUNTER — Inpatient Hospital Stay (HOSPITAL_COMMUNITY)
Admission: EM | Admit: 2020-05-02 | Discharge: 2020-05-07 | DRG: 872 | Disposition: A | Discharge status: Home Health | Payer: Medicare Other | Attending: Internal Medicine | Admitting: Internal Medicine

## 2020-05-02 DIAGNOSIS — N201 Calculus of ureter: Secondary | ICD-10-CM

## 2020-05-02 DIAGNOSIS — Z79890 Hormone replacement therapy (postmenopausal): Secondary | ICD-10-CM

## 2020-05-02 DIAGNOSIS — N139 Obstructive and reflux uropathy, unspecified: Secondary | ICD-10-CM | POA: Diagnosis not present

## 2020-05-02 DIAGNOSIS — G4733 Obstructive sleep apnea (adult) (pediatric): Secondary | ICD-10-CM | POA: Diagnosis present

## 2020-05-02 DIAGNOSIS — A419 Sepsis, unspecified organism: Secondary | ICD-10-CM | POA: Diagnosis not present

## 2020-05-02 DIAGNOSIS — Z8249 Family history of ischemic heart disease and other diseases of the circulatory system: Secondary | ICD-10-CM

## 2020-05-02 DIAGNOSIS — A4159 Other Gram-negative sepsis: Secondary | ICD-10-CM | POA: Diagnosis not present

## 2020-05-02 DIAGNOSIS — R251 Tremor, unspecified: Secondary | ICD-10-CM | POA: Diagnosis present

## 2020-05-02 DIAGNOSIS — L89891 Pressure ulcer of other site, stage 1: Secondary | ICD-10-CM | POA: Diagnosis present

## 2020-05-02 DIAGNOSIS — I739 Peripheral vascular disease, unspecified: Secondary | ICD-10-CM | POA: Diagnosis not present

## 2020-05-02 DIAGNOSIS — I2510 Atherosclerotic heart disease of native coronary artery without angina pectoris: Secondary | ICD-10-CM | POA: Diagnosis not present

## 2020-05-02 DIAGNOSIS — E860 Dehydration: Secondary | ICD-10-CM | POA: Diagnosis present

## 2020-05-02 DIAGNOSIS — G35D Multiple sclerosis, unspecified: Secondary | ICD-10-CM | POA: Diagnosis present

## 2020-05-02 DIAGNOSIS — L89511 Pressure ulcer of right ankle, stage 1: Secondary | ICD-10-CM | POA: Diagnosis present

## 2020-05-02 DIAGNOSIS — I129 Hypertensive chronic kidney disease with stage 1 through stage 4 chronic kidney disease, or unspecified chronic kidney disease: Secondary | ICD-10-CM | POA: Diagnosis not present

## 2020-05-02 DIAGNOSIS — N135 Crossing vessel and stricture of ureter without hydronephrosis: Secondary | ICD-10-CM | POA: Diagnosis not present

## 2020-05-02 DIAGNOSIS — N1831 Chronic kidney disease, stage 3a: Secondary | ICD-10-CM | POA: Diagnosis not present

## 2020-05-02 DIAGNOSIS — G8929 Other chronic pain: Secondary | ICD-10-CM | POA: Diagnosis not present

## 2020-05-02 DIAGNOSIS — Z9049 Acquired absence of other specified parts of digestive tract: Secondary | ICD-10-CM

## 2020-05-02 DIAGNOSIS — E785 Hyperlipidemia, unspecified: Secondary | ICD-10-CM | POA: Diagnosis present

## 2020-05-02 DIAGNOSIS — Z87442 Personal history of urinary calculi: Secondary | ICD-10-CM

## 2020-05-02 DIAGNOSIS — M549 Dorsalgia, unspecified: Secondary | ICD-10-CM | POA: Diagnosis present

## 2020-05-02 DIAGNOSIS — N179 Acute kidney failure, unspecified: Secondary | ICD-10-CM | POA: Diagnosis not present

## 2020-05-02 DIAGNOSIS — Z888 Allergy status to other drugs, medicaments and biological substances status: Secondary | ICD-10-CM

## 2020-05-02 DIAGNOSIS — N209 Urinary calculus, unspecified: Secondary | ICD-10-CM | POA: Diagnosis not present

## 2020-05-02 DIAGNOSIS — Z79899 Other long term (current) drug therapy: Secondary | ICD-10-CM

## 2020-05-02 DIAGNOSIS — I420 Dilated cardiomyopathy: Secondary | ICD-10-CM | POA: Diagnosis present

## 2020-05-02 DIAGNOSIS — Z8673 Personal history of transient ischemic attack (TIA), and cerebral infarction without residual deficits: Secondary | ICD-10-CM

## 2020-05-02 DIAGNOSIS — N319 Neuromuscular dysfunction of bladder, unspecified: Secondary | ICD-10-CM | POA: Diagnosis present

## 2020-05-02 DIAGNOSIS — G4700 Insomnia, unspecified: Secondary | ICD-10-CM | POA: Diagnosis not present

## 2020-05-02 DIAGNOSIS — Z9359 Other cystostomy status: Secondary | ICD-10-CM

## 2020-05-02 DIAGNOSIS — L89151 Pressure ulcer of sacral region, stage 1: Secondary | ICD-10-CM | POA: Diagnosis present

## 2020-05-02 DIAGNOSIS — E870 Hyperosmolality and hypernatremia: Secondary | ICD-10-CM | POA: Diagnosis present

## 2020-05-02 DIAGNOSIS — Z961 Presence of intraocular lens: Secondary | ICD-10-CM | POA: Diagnosis not present

## 2020-05-02 DIAGNOSIS — E875 Hyperkalemia: Secondary | ICD-10-CM | POA: Diagnosis not present

## 2020-05-02 DIAGNOSIS — L8990 Pressure ulcer of unspecified site, unspecified stage: Secondary | ICD-10-CM | POA: Diagnosis present

## 2020-05-02 DIAGNOSIS — G35 Multiple sclerosis: Secondary | ICD-10-CM | POA: Diagnosis not present

## 2020-05-02 DIAGNOSIS — N261 Atrophy of kidney (terminal): Secondary | ICD-10-CM | POA: Diagnosis not present

## 2020-05-02 DIAGNOSIS — N189 Chronic kidney disease, unspecified: Secondary | ICD-10-CM | POA: Diagnosis present

## 2020-05-02 DIAGNOSIS — E876 Hypokalemia: Secondary | ICD-10-CM | POA: Diagnosis not present

## 2020-05-02 DIAGNOSIS — R7881 Bacteremia: Secondary | ICD-10-CM | POA: Diagnosis present

## 2020-05-02 DIAGNOSIS — Z87891 Personal history of nicotine dependence: Secondary | ICD-10-CM

## 2020-05-02 DIAGNOSIS — Z20822 Contact with and (suspected) exposure to covid-19: Secondary | ICD-10-CM | POA: Diagnosis not present

## 2020-05-02 DIAGNOSIS — I700 Atherosclerosis of aorta: Secondary | ICD-10-CM | POA: Diagnosis not present

## 2020-05-02 DIAGNOSIS — N1330 Unspecified hydronephrosis: Secondary | ICD-10-CM | POA: Diagnosis present

## 2020-05-02 DIAGNOSIS — N211 Calculus in urethra: Secondary | ICD-10-CM | POA: Diagnosis present

## 2020-05-02 DIAGNOSIS — E039 Hypothyroidism, unspecified: Secondary | ICD-10-CM | POA: Diagnosis not present

## 2020-05-02 DIAGNOSIS — N132 Hydronephrosis with renal and ureteral calculous obstruction: Secondary | ICD-10-CM | POA: Diagnosis present

## 2020-05-02 DIAGNOSIS — N183 Chronic kidney disease, stage 3 unspecified: Secondary | ICD-10-CM | POA: Diagnosis present

## 2020-05-02 DIAGNOSIS — Z9842 Cataract extraction status, left eye: Secondary | ICD-10-CM

## 2020-05-02 DIAGNOSIS — N136 Pyonephrosis: Secondary | ICD-10-CM | POA: Diagnosis present

## 2020-05-02 DIAGNOSIS — Z79891 Long term (current) use of opiate analgesic: Secondary | ICD-10-CM

## 2020-05-02 DIAGNOSIS — M87851 Other osteonecrosis, right femur: Secondary | ICD-10-CM | POA: Diagnosis not present

## 2020-05-02 DIAGNOSIS — Z960 Presence of urogenital implants: Secondary | ICD-10-CM | POA: Diagnosis not present

## 2020-05-02 DIAGNOSIS — Z9841 Cataract extraction status, right eye: Secondary | ICD-10-CM

## 2020-05-02 DIAGNOSIS — Z86010 Personal history of colonic polyps: Secondary | ICD-10-CM

## 2020-05-02 DIAGNOSIS — N390 Urinary tract infection, site not specified: Secondary | ICD-10-CM | POA: Diagnosis present

## 2020-05-02 DIAGNOSIS — J8410 Pulmonary fibrosis, unspecified: Secondary | ICD-10-CM | POA: Diagnosis present

## 2020-05-02 DIAGNOSIS — B964 Proteus (mirabilis) (morganii) as the cause of diseases classified elsewhere: Secondary | ICD-10-CM | POA: Diagnosis present

## 2020-05-02 DIAGNOSIS — N131 Hydronephrosis with ureteral stricture, not elsewhere classified: Secondary | ICD-10-CM | POA: Diagnosis present

## 2020-05-02 DIAGNOSIS — Z820 Family history of epilepsy and other diseases of the nervous system: Secondary | ICD-10-CM

## 2020-05-02 DIAGNOSIS — R000 Tachycardia, unspecified: Secondary | ICD-10-CM | POA: Diagnosis not present

## 2020-05-02 HISTORY — PX: IR NEPHROSTOMY PLACEMENT LEFT: IMG6063

## 2020-05-02 LAB — CBC WITH DIFFERENTIAL/PLATELET
Abs Immature Granulocytes: 0.05 10*3/uL (ref 0.00–0.07)
Basophils Absolute: 0.1 10*3/uL (ref 0.0–0.1)
Basophils Relative: 1 %
Eosinophils Absolute: 0.3 10*3/uL (ref 0.0–0.5)
Eosinophils Relative: 2 %
HCT: 42.1 % (ref 39.0–52.0)
Hemoglobin: 13.3 g/dL (ref 13.0–17.0)
Immature Granulocytes: 0 %
Lymphocytes Relative: 9 %
Lymphs Abs: 1.2 10*3/uL (ref 0.7–4.0)
MCH: 31.1 pg (ref 26.0–34.0)
MCHC: 31.6 g/dL (ref 30.0–36.0)
MCV: 98.6 fL (ref 80.0–100.0)
Monocytes Absolute: 0.7 10*3/uL (ref 0.1–1.0)
Monocytes Relative: 5 %
Neutro Abs: 10.9 10*3/uL — ABNORMAL HIGH (ref 1.7–7.7)
Neutrophils Relative %: 83 %
Platelets: 157 10*3/uL (ref 150–400)
RBC: 4.27 MIL/uL (ref 4.22–5.81)
RDW: 13.6 % (ref 11.5–15.5)
WBC: 13.2 10*3/uL — ABNORMAL HIGH (ref 4.0–10.5)
nRBC: 0 % (ref 0.0–0.2)

## 2020-05-02 LAB — COMPREHENSIVE METABOLIC PANEL
ALT: 13 U/L (ref 0–44)
AST: 18 U/L (ref 15–41)
Albumin: 4.4 g/dL (ref 3.5–5.0)
Alkaline Phosphatase: 93 U/L (ref 38–126)
Anion gap: 10 (ref 5–15)
BUN: 33 mg/dL — ABNORMAL HIGH (ref 8–23)
CO2: 25 mmol/L (ref 22–32)
Calcium: 8.9 mg/dL (ref 8.9–10.3)
Chloride: 104 mmol/L (ref 98–111)
Creatinine, Ser: 1.89 mg/dL — ABNORMAL HIGH (ref 0.61–1.24)
GFR calc Af Amer: 39 mL/min — ABNORMAL LOW (ref >60)
GFR calc non Af Amer: 33 mL/min — ABNORMAL LOW (ref >60)
Glucose, Bld: 162 mg/dL — ABNORMAL HIGH (ref 70–99)
Potassium: 3.4 mmol/L — ABNORMAL LOW (ref 3.5–5.1)
Sodium: 139 mmol/L (ref 135–145)
Total Bilirubin: 0.6 mg/dL (ref 0.3–1.2)
Total Protein: 7.7 g/dL (ref 6.5–8.1)

## 2020-05-02 LAB — PROTIME-INR
INR: 1 (ref 0.8–1.2)
Prothrombin Time: 12.7 seconds (ref 11.4–15.2)

## 2020-05-02 LAB — SARS CORONAVIRUS 2 BY RT PCR (HOSPITAL ORDER, PERFORMED IN ~~LOC~~ HOSPITAL LAB): SARS Coronavirus 2: NEGATIVE

## 2020-05-02 LAB — LACTIC ACID, PLASMA
Lactic Acid, Venous: 1.7 mmol/L (ref 0.5–1.9)
Lactic Acid, Venous: 1.8 mmol/L (ref 0.5–1.9)

## 2020-05-02 LAB — APTT: aPTT: 28 seconds (ref 24–36)

## 2020-05-02 MED ORDER — SODIUM CHLORIDE 0.9 % IV BOLUS
1000.0000 mL | Freq: Once | INTRAVENOUS | Status: AC
  Administered 2020-05-02: 1000 mL via INTRAVENOUS

## 2020-05-02 MED ORDER — MIDAZOLAM HCL 2 MG/2ML IJ SOLN
INTRAMUSCULAR | Status: AC
  Filled 2020-05-02: qty 2

## 2020-05-02 MED ORDER — ACETAMINOPHEN 325 MG PO TABS
650.0000 mg | ORAL_TABLET | Freq: Once | ORAL | Status: AC
  Administered 2020-05-02: 650 mg via ORAL
  Filled 2020-05-02: qty 2

## 2020-05-02 MED ORDER — FENTANYL CITRATE (PF) 100 MCG/2ML IJ SOLN
50.0000 ug | Freq: Once | INTRAMUSCULAR | Status: AC
  Administered 2020-05-02: 50 ug via INTRAVENOUS
  Filled 2020-05-02: qty 2

## 2020-05-02 MED ORDER — VANCOMYCIN HCL 500 MG/100ML IV SOLN
500.0000 mg | INTRAVENOUS | Status: DC
  Filled 2020-05-02: qty 100

## 2020-05-02 MED ORDER — SODIUM CHLORIDE 0.9 % IV SOLN
2.0000 g | Freq: Once | INTRAVENOUS | Status: DC
  Filled 2020-05-02: qty 2

## 2020-05-02 MED ORDER — IOHEXOL 300 MG/ML  SOLN
50.0000 mL | Freq: Once | INTRAMUSCULAR | Status: AC | PRN
  Administered 2020-05-03: 20 mL

## 2020-05-02 MED ORDER — IOHEXOL 300 MG/ML  SOLN: 50.0000 mL | Freq: Once | INTRAMUSCULAR | Status: DC | PRN

## 2020-05-02 MED ORDER — FENTANYL CITRATE (PF) 100 MCG/2ML IJ SOLN
INTRAMUSCULAR | Status: AC | PRN
  Administered 2020-05-02: 50 ug via INTRAVENOUS

## 2020-05-02 MED ORDER — ONDANSETRON HCL 4 MG/2ML IJ SOLN
4.0000 mg | Freq: Once | INTRAMUSCULAR | Status: AC
  Administered 2020-05-02: 4 mg via INTRAVENOUS
  Filled 2020-05-02: qty 2

## 2020-05-02 MED ORDER — LACTATED RINGERS IV SOLN: INTRAVENOUS | Status: DC

## 2020-05-02 MED ORDER — SODIUM CHLORIDE 0.9 % IV SOLN
2.0000 g | INTRAVENOUS | Status: DC
  Administered 2020-05-02 – 2020-05-03 (×2): 2 g via INTRAVENOUS
  Filled 2020-05-02 (×2): qty 2

## 2020-05-02 MED ORDER — HYDROMORPHONE HCL 1 MG/ML IJ SOLN
1.0000 mg | Freq: Once | INTRAMUSCULAR | Status: AC
  Administered 2020-05-02: 1 mg via INTRAVENOUS
  Filled 2020-05-02: qty 1

## 2020-05-02 MED ORDER — LIDOCAINE HCL 1 % IJ SOLN
INTRAMUSCULAR | Status: AC
  Filled 2020-05-02: qty 20

## 2020-05-02 MED ORDER — FENTANYL CITRATE (PF) 100 MCG/2ML IJ SOLN
INTRAMUSCULAR | Status: AC
  Filled 2020-05-02: qty 2

## 2020-05-02 MED ORDER — METRONIDAZOLE IN NACL 5-0.79 MG/ML-% IV SOLN
500.0000 mg | Freq: Once | INTRAVENOUS | Status: AC
  Administered 2020-05-02: 500 mg via INTRAVENOUS
  Filled 2020-05-02: qty 100

## 2020-05-02 MED ORDER — MIDAZOLAM HCL 2 MG/2ML IJ SOLN
INTRAMUSCULAR | Status: AC | PRN
  Administered 2020-05-02: 1 mg via INTRAVENOUS

## 2020-05-02 MED ORDER — VANCOMYCIN HCL IN DEXTROSE 1-5 GM/200ML-% IV SOLN
1000.0000 mg | Freq: Once | INTRAVENOUS | Status: AC
  Administered 2020-05-02: 1000 mg via INTRAVENOUS
  Filled 2020-05-02: qty 200

## 2020-05-02 NOTE — Consult Note (Signed)
Chief Complaint: Flank pain  Referring Physician(s): Dr. Phoebe Sharps, Emergency   Supervising Physician: Corrie Mckusick  Patient Status: [facility] - ED  History of Present Illness: Jordan Ward is a 79 y.o. male presenting as urgent transfer to [facility] ED from [facility] ED, with CT imaging showing acute left hydronephrosis secondary to obstructive ureteral stone.   Jordan Ward tells me that his flank pain started about 2pm today, and given that his urine output went down, he came to the ED.  He has history of an atrophic right kidney, solitary left kidney, and a prior supra-pubic catheter secondary to outlet obstruction.   He also tells me that he has had some shaking/tremors earlier, however, his medical history is positive for tremors.  He is currently afebrile, with SBP 140-150 without tachycardia, HR of 9. Temp at 5:40pm recorded as 101.1.    He denies any chest pain/SOB.  He last ate at 12pm/noon.     Past Medical History:  Diagnosis Date  . Anemia   . Arthritis   . Back pain, chronic   . Bilateral carotid bruits   . C. difficile colitis 09/2011  . CAD (coronary artery disease)   . Carotid artery stenosis   . Cerebrovascular disease   . Cerebrovascular disease 08/17/2018  . Colon polyps   . Complication of cystostomy catheter, initial encounter ([place]) 04/20/2020  . Dyslipidemia   . Dysphagia 10/07/2011  . Encephalopathy   . Gait disorder   . HA (headache)   . High grade dysplasia in colonic adenoma 09/2005  . History of kidney stones   . HTN (hypertension), malignant 10/06/2011  . Hypernatremia   . Hypokalemia   . Hypothyroidism 10/08/2011  . Insomnia   . Junctional rhythm   . Kidney stones   . MS (multiple sclerosis) ([place])   . Neuromuscular disorder (HCC)    MS  . OSA (obstructive sleep apnea)   . Paroxysmal atrial tachycardia ([place])   . Peripheral vascular disease ([place])   . Pneumonia 4 yrs ago  . Pulmonary fibrosis ([place]) 10/06/2011  . Pulmonary nodule 10/08/2011  . PVD (peripheral  vascular disease) ([place])   . Sacral ulcer ([place])   . Sleep apnea    cannot tolerate  . Stroke [facility])    left sided weakness  . Suprapubic catheter ([place])   . TIA (transient ischemic attack)   . Tremors of nervous system 10/08/2011  . Urinary tract infection   . Ventral hernia without obstruction or gangrene     Large 8X9cm ventral hernia with loss of domain. CT reads report as diastasis recti with herniation or diastasis recti.  Dr. Constance Haw, Surgery, reviewed CT with radiology and there is herniation with only hernia sac or peritoneum over the bowel and large separation of the rectus muslce (i.e. diastasis recti aka loss of domain).  No surgical intervention recommended given size, age, and health.     Past Surgical History:  Procedure Laterality Date  . APPENDECTOMY  09/2005   at time of left hemicolectomy  . BACK SURGERY  1976/1979   lower  . BILIARY DILATION N/A 03/03/2020   Procedure: BILIARY DILATION;  Surgeon: Rogene Houston, MD;  Location: [facility] ENDO SUITE;  Service: Endoscopy;  Laterality: N/A;  . CATARACT EXTRACTION W/PHACO Right 03/08/2018   Procedure: CATARACT EXTRACTION PHACO AND INTRAOCULAR LENS PLACEMENT RIGHT EYE;  Surgeon: Tonny Branch, MD;  Location: [facility] ORS;  Service: Ophthalmology;  Laterality: Right;  CDE: 8.86  . CATARACT EXTRACTION W/PHACO Left 04/05/2018  Procedure: CATARACT EXTRACTION PHACO AND INTRAOCULAR LENS PLACEMENT (IOC);  Surgeon: Tonny Branch, MD;  Location: [facility] ORS;  Service: Ophthalmology;  Laterality: Left;  CDE: 7.36  . CHOLECYSTECTOMY     Dr. Tamala Julian  . COLON SURGERY  09/2005   [place]: four tubular adenomas, large adenomatous polyp with HIGH GRADE dysplasia  . COLONOSCOPY  11/2004   Dr. Sharol Roussel sessile polyp splenic flexure, 33mm sessile polyp desc colon, tubulovillous adenoma (bx not removed)  . COLONOSCOPY  01/2005   poor prep, polyp could not be found  . COLONOSCOPY  05/2005   with EMR, polypectomy Dr. Olegario Messier, bx showed high grade dysplasia,  partially resected  . COLONOSCOPY  09/2005   Dr. Arsenio Loader, [place] ink tattooing, four villous colon polyp (3 had been missed on previous colonoscopies due to limitations of procedures  . COLONOSCOPY  09/2006   normal TI, no polyps  . COLONOSCOPY  10/2007   Dr. Imogene Burn distal mammillations, benign bx, normal TI, random bx neg for microscopic colitis  . CYSTOSCOPY WITH LITHOLAPAXY N/A 07/27/2018   Procedure: CYSTOSCOPY WITH LITHOLAPAXY VIA  SUPRAPUBIC TUBE;  Surgeon: Franchot Gallo, MD;  Location: [facility] ORS;  Service: Urology;  Laterality: N/A;  . CYSTOSCOPY WITH RETROGRADE PYELOGRAM, URETEROSCOPY AND STENT PLACEMENT Left 06/09/2017   Procedure: CYSTOSCOPY WITH LEFT RETROGRADE PYELOGRAM, LEFT URETEROSCOPY, LEFT URETEROSCOPIC STONE EXTRACTION, LEFT URETERAL STENT PLACEMENT;  Surgeon: Franchot Gallo, MD;  Location: [facility] ORS;  Service: Urology;  Laterality: Left;  . ERCP N/A 03/03/2020   Procedure: ENDOSCOPIC RETROGRADE CHOLANGIOPANCREATOGRAPHY (ERCP);  Surgeon: Rogene Houston, MD;  Location: [facility];  Service: Endoscopy;  Laterality: N/A;  . ERCP N/A 04/20/2020   Procedure: ENDOSCOPIC RETROGRADE CHOLANGIOPANCREATOGRAPHY (ERCP);  Surgeon: Rogene Houston, MD;  Location: [facility];  Service: Endoscopy;  Laterality: N/A;  to be done at 7:30am in OR  . GASTROINTESTINAL STENT REMOVAL N/A 04/20/2020   Procedure: STENT REMOVAL;  Surgeon: Rogene Houston, MD;  Location: [facility];  Service: Endoscopy;  Laterality: N/A;  . INGUINAL HERNIA REPAIR  1971   bilateral  . INSERTION OF SUPRAPUBIC CATHETER  06/09/2017   Procedure: EXCHANGE OF SUPRAPUBIC CATHETER;  Surgeon: Franchot Gallo, MD;  Location: [facility]S;  Service: Urology;;  . IR NEPHROSTOMY PLACEMENT LEFT  05/26/2017  . KIDNEY STONE SURGERY  09/13/2015  . LITHOTRIPSY N/A 03/03/2020   Procedure: MECHANICAL LITHOTRIPSY WITH REMOVAL OF MULTIPLE STONE FRAGMENTS;  Surgeon: Rogene Houston, MD;  Location[facility];  Service: Endoscopy;   Laterality: N/A;  . NEPHROLITHOTOMY Left 09/13/2015   Procedure: LEFT PERCUTANEOUS NEPHROLITHOTOMY ;  Surgeon: Franchot Gallo, MD;  Location: [facility] ORS;  Service: Urology;  Laterality: Left;  . REMOVAL OF STONES N/A 04/20/2020   Procedure: REMOVAL OF STONES;  Surgeon: Rogene Houston, MD;  Location: [facility];  Service: Endoscopy;  Laterality: N/A;  . SPHINCTEROTOMY  03/03/2020   Procedure: BILLARY SPHINCTEROTOMY;  Surgeon: Rogene Houston, MD;  Location: [facility];  Service: Endoscopy;;  . SUPRAPUBIC CATHETER INSERTION      Allergies: Tetracyclines & related and Ciprofloxacin  Medications: Prior to Admission medications   Medication Sig Start Date End Date Taking? Authorizing Provider  amLODipine (NORVASC) 5 MG tablet Take 5 mg by mouth daily.  04/02/20  Yes [provider]  carvedilol (COREG) 6.25 MG tablet Take 6.25 mg by mouth 2 (two) times daily with a meal.   Yes [provider]  diazepam (VALIUM) 5 MG tablet TAKE (1) TABLET BY MOUTH TWICE  DAILY. 11/14/19  Yes Kathrynn Ducking, MD  HYDROcodone-acetaminophen (NORCO) 7.5-325 MG per tablet Take 1 tablet by mouth 2 (two) times daily. Max APAP 3 GM IN 24 HOURS FROM ALL SOURCES Patient taking differently: Take 1 tablet by mouth in the morning and at bedtime. ALL MEDICATIONS TO BE CRUSHED AND PLACED IN APPLESAUCE 08/22/14  Yes Pandey, Mahima, MD  levothyroxine (SYNTHROID) 75 MCG tablet Take 75 mcg by mouth daily before breakfast. ALL MEDICATIONS TO BE CRUSHED AND PLACED IN APPLESAUCE 12/21/19  Yes [provider]  mirabegron ER (MYRBETRIQ) 25 MG TB24 tablet Take 25 mg by mouth daily as needed (bladder spasms).   Yes [provider]     Family History  Problem Relation Age of Onset  . Cirrhosis Brother        etoh  . Stroke Mother 26  . Coronary artery disease Father 84  . Heart attack Brother   . Cancer Sister   . Multiple sclerosis Other   . Colon cancer Neg Hx     Social History    Socioeconomic History  . Marital status: Married    Spouse name: Pricilla Holm  . Number of children: 2  . Years of education: 10  . Highest education level: Not on file  Occupational History  . Occupation: disability    Employer: RETIRED  Tobacco Use  . Smoking status: Former Smoker    Packs/day: 1.00    Years: 25.00    Pack years: 25.00    Types: Cigarettes    Quit date: 03/03/1988    Years since quitting: 32.1  . Smokeless tobacco: Never Used  Vaping Use  . Vaping Use: Never used  Substance and Sexual Activity  . Alcohol use: No  . Drug use: No  . Sexual activity: Never  Other Topics Concern  . Not on file  Social History Narrative   Patient is married Pricilla Holm) and  Lives w/ wife.   Patient is retired.   Patient has a 10th grade education.   Patient has two children.      Patient drinks about 1-2 sodas daily.   Patient is left handed.    Social Determinants of Health   Financial Resource Strain:   . Difficulty of Paying Living Expenses:   Food Insecurity:   . Worried About Charity fundraiser in the Last Year:   . Arboriculturist in the Last Year:   Transportation Needs:   . Film/video editor (Medical):   Marland Kitchen Lack of Transportation (Non-Medical):   Physical Activity:   . Days of Exercise per Week:   . Minutes of Exercise per Session:   Stress:   . Feeling of Stress :   Social Connections:   . Frequency of Communication with Friends and Family:   . Frequency of Social Gatherings with Friends and Family:   . Attends Religious Services:   . Active Member of Clubs or Organizations:   . Attends Archivist Meetings:   Marland Kitchen Marital Status:       Review of Systems: A 12 point ROS discussed and pertinent positives are indicated in the HPI above.  All other systems are negative.  Review of Systems  Vital Signs: BP (!) 147/67   Pulse 79   Temp (!) 100.7 F (38.2 C)   Resp 15   Ht 5\' 4"  (1.626 m)   Wt 51 kg   SpO2 93%   BMI 19.30 kg/m   Physical  Exam General:  79 yo male appearing stated age.   Uncomfortable complaining of flank pain. HEENT: Atraumatic, normocephalic.  Conjugate gaze, extra-ocular motor intact. No scleral icterus or scleral injection. No lesions on external ears, nose, lips, or gums.  Oral mucosa moist, pink.  Neck: Symmetric with no goiter enlargement.  Chest/Lungs:  Symmetric chest with inspiration/expiration.  No labored breathing.    Heart:   . No JVD appreciated.  Abdomen:  Soft, NT/ND, with + bowel sounds.  Protuberant at site of known hernia Genito-urinary: supra-pubic catheter with no hematuria observerd Neurologic: Alert & Oriented to person, place, and time.   Normal affect and insight.  Appropriate questions.   .       Imaging: DG Abd 1 View - KUB  Result Date: 04/19/2020 CLINICAL DATA:  Follow-up common bile duct stent EXAM: ABDOMEN - 1 VIEW COMPARISON:  03/03/2020 FINDINGS: Scattered large and small bowel gas is noted. No obstructive changes are seen. Biliary stent is again seen in the right upper quadrant. Pneumobilia is seen related to the stent. No bony abnormality is noted. IMPRESSION: Biliary stent in place with associated pneumobilia. Electronically Signed   By: Inez Catalina M.D.   On: 04/19/2020 16:46   DG Chest Port 1 View  Result Date: 05/02/2020 CLINICAL DATA:  Sepsis. EXAM: PORTABLE CHEST 1 VIEW COMPARISON:  Ten thousand eighteen FINDINGS: Chronic bronchitic changes are noted bilaterally. There is no pneumothorax. No pleural effusion. No acute osseous abnormality. There are old healed left-sided rib fractures. The heart size is unchanged. Aortic calcifications are noted. There is a lucency projecting over the partially visualized liver shadow. IMPRESSION: 1. No acute cardiopulmonary process. 2. Lucency projecting over the partially visualized liver shadow. This may represent pneumobilia, perhaps from recent sphincterotomy. Correlation with a dedicated abdominal radiograph is recommended.  Electronically Signed   By: Constance Holster M.D.   On: 05/02/2020 17:35   DG ERCP  Result Date: 04/20/2020 CLINICAL DATA:  79 year old male with a history choledocholithiasis EXAM: ERCP TECHNIQUE: Multiple spot images obtained with the fluoroscopic device and submitted for interpretation post-procedure. FLUOROSCOPY TIME:  Fluoroscopy Time: 2 minutes 40 seconds COMPARISON:  None. FINDINGS: Limited images during ERCP. Initial image demonstrates endoscope projecting over the upper abdomen. Subsequently there is cannulation of the ampulla and retrograde infusion of contrast partially opacifying the extrahepatic biliary system. Deployment of a stone retrieval basket and retrieval balloon. Surgical changes of cholecystectomy. IMPRESSION: Limited images during ERCP demonstrates treatment of choledocholithiasis with deployment of a basket and retrieval balloon. Please refer to the dictated operative report for full details of intraoperative findings and procedure. Electronically Signed   By: Corrie Mckusick D.O.   On: 04/20/2020 13:02   CT Renal Stone Study  Result Date: 05/02/2020 CLINICAL DATA:  Flank pain. Chronic suprapubic catheter replaced last week. EXAM: CT ABDOMEN AND PELVIS WITHOUT CONTRAST TECHNIQUE: Multidetector CT imaging of the abdomen and pelvis was performed following the standard protocol without IV contrast. COMPARISON:  CT dated March 01, 2020 FINDINGS: Lower chest: There are chronic changes at the lung bases bilaterally with areas of subpleural reticulation.The heart size is normal. Hepatobiliary: The liver is normal. Status post cholecystectomy.There is pneumobilia primarily within the left hepatic lobe. This corresponds well with the finding seen on the patient's recent chest x-ray and is likely related to prior sphincterotomy. Pancreas: Normal contours without ductal dilatation. No peripancreatic fluid collection. Spleen: Unremarkable. Adrenals/Urinary Tract: --Adrenal glands: Unremarkable.  --Right kidney/ureter: The right kidney is atrophic without evidence for hydronephrosis. There are nonobstructing stones  in the lower pole the right kidney. --Left kidney/ureter: There is new severe left-sided hydroureteronephrosis secondary to an obstructing stone in the distal left ureter measuring approximately 8 mm (axial series 2, image 57). There may be an additional stone fragment at the left UVJ measuring approximately 5 mm (axial series 2, image 70). There are residual stones in the left kidney measuring up to approximately 1 cm in the lower pole. --Urinary bladder: The bladder is decompressed with a suprapubic catheter. The catheter appears to be grossly well position. There is hyperdense material within the collapsed urinary bladder which may represent contrast or stone material/gravel. Stomach/Bowel: --Stomach/Duodenum: No hiatal hernia or other gastric abnormality. Normal duodenal course and caliber. --Small bowel: Unremarkable. --Colon: The patient is status post prior left hemicolectomy. There is a surgical anastomosis in the patient's pelvis that is patent. --Appendix: Surgically absent. Vascular/Lymphatic: Atherosclerotic calcification is present within the non-aneurysmal abdominal aorta, without hemodynamically significant stenosis. 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 --No retroperitoneal lymphadenopathy. --No mesenteric lymphadenopathy. --No pelvic or inguinal lymphadenopathy. Reproductive: Unremarkable Other: Again noted is significant diastasis of the low anterior abdominal [place]. Musculoskeletal. 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 IMPRESSION: 1. New severe left-sided hydroureteronephrosis secondary to an obstructing stone in the distal left ureter measuring approximately 8 mm. There may be an additional stone fragment at the left UVJ measuring approximately 5 mm. 2. Bilateral nonobstructing nephrolithiasis.   Atrophic right kidney. 3. The bladder is decompressed with a suprapubic catheter. There is hyperdense material within the collapsed urinary bladder which may represent contrast or stone material/gravel. 4. Pneumobilia, likely related to interval sphincterotomy. This explains the finding seen on the patient's recent chest x-ray. 5. Avascular necrosis of the right femoral head. 6. 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 7. Additional chronic findings as detailed above. Aortic Atherosclerosis (ICD10-I70.0). Electronically Signed   By: Constance Holster M.D.   On: 05/02/2020 19:28    Labs:  CBC: Recent Labs    03/03/20 0536 03/04/20 0410 04/18/20 1258 05/02/20 1718  WBC 5.4 5.5 5.9 13.2*  HGB 11.5* 11.9* 12.9* 13.3  HCT 36.2* 36.1* 40.2 42.1  PLT 123* 130* 166 157    COAGS: Recent Labs    03/03/20 0536 05/02/20 1718  INR 1.1 1.0  APTT  --  28    BMP: Recent Labs    03/02/20 0338 03/03/20 0536 04/18/20 1258 05/02/20 1718  NA 139 141 141 139  K 4.5 3.8 3.5 3.4*  CL 108 107 101 104  CO2 23 27 26 25   GLUCOSE 87 97 99 162*  BUN 43* 25* 27* 33*  CALCIUM 7.9* 8.2* 9.3 8.9  CREATININE 1.94* 1.30* 1.60* 1.89*  GFRNONAA 33* 53* 41* 33*  GFRAA 38* >60 47* 39*    LIVER FUNCTION TESTS: Recent Labs    03/03/20 0536 03/04/20 0410 04/18/20 1258 05/02/20 1718  BILITOT 0.4 0.6 0.7 0.6  AST 35 23 18 18   ALT 76* 58* 13 13  ALKPHOS 144* 132* 85 93  PROT 5.6* 6.0* 7.4 7.7  ALBUMIN 3.0* 3.2* 4.1 4.4    TUMOR MARKERS: No results for input(s): AFPTM, CEA, CA199, CHROMGRNA in the last 8760 hours.  Assessment and Plan:  Jordan Maggio is a 79 yo male with acute left hydronephrosis involving his solitary kidney, and evidence of bacteremia.   Obstructing left ureteral stone. Indwelling supra-pubic catheter, likely with colonization of his  urinary system.  I agree with Dr. Alyson Ingles that he is at risk for  uro-sepsis/deterioration, and decompression is indicated.   Risks and benefits of left PCN placement was discussed with the patient including, but not limited to, infection, bleeding, significant bleeding causing loss or decrease in renal function or damage to adjacent structures.   All of the patient's questions were answered, patient is agreeable to proceed.  Consent signed and in chart.  Plan to proceed urgently.    Thank you for this interesting consult.  I greatly enjoyed meeting SIERRA BISSONETTE and look forward to participating in their care.  A copy of this report was sent to the requesting provider on this date.  Electronically Signed: Corrie Mckusick, DO 05/02/2020, 11:31 PM   I spent a total of 40 Minutes    in face to face in clinical consultation, greater than 50% of which was counseling/coordinating care for acute left hydronephrosis of solitary kidney, possible percutaneous nephrostomy.

## 2020-05-02 NOTE — ED Notes (Signed)
Pt transport to IR

## 2020-05-02 NOTE — ED Provider Notes (Signed)
Patient transferred from [facility] with ?sepsis in the setting of 8 mm left ureteral stone, h/o solitary kidney. Plan to admit to [facility], IR Earleen Newport)  to insert nephrostomy as arranged through Dr. Alyson Ingles (UR). Hospitalist to admit.   Discussed with Dr. Alinda Money (UR) who will follow in the hospital. IR paged.  Patient complains of pain - 2nd IV fentanyl ordered.  10:30 - discussed with IR who plans procedure for tonight. Hospitalist paged.  Discussed with hospitalist who accepts for admission.   Charlann Lange, PA-C 05/02/20 2327    Malvin Johns, MD 05/02/20 [phone]

## 2020-05-02 NOTE — ED Provider Notes (Signed)
[facility] EMERGENCY DEPARTMENT Provider Note   CSN: [id number] Arrival date & time: 05/02/20  4259     History Chief Complaint  Patient presents with  . urinary retention    Jordan Ward is a 79 y.o. male with a past medical history of indwelling suprapubic catheter, previous CVA, CAD, chronic back pain, paroxysmal atrial tachycardia, peripheral vascular disease, dilated cardiomyopathy and pulmonary fibrosis who presents emergency department chief complaint of urinary retention.  Patient states that around 12 PM today he started having any urinary output and began having uncontrollable rigors.  He denies n/v.  He has a history of recurrent urinary stones that block the catheter.  His suprapubic catheter was patent changed 2 weeks ago.  HPI     Past Medical History:  Diagnosis Date  . Anemia   . Arthritis   . Back pain, chronic   . Bilateral carotid bruits   . C. difficile colitis 09/2011  . CAD (coronary artery disease)   . Carotid artery stenosis   . Cerebrovascular disease   . Cerebrovascular disease 08/17/2018  . Colon polyps   . Complication of cystostomy catheter, initial encounter ([place]) 04/20/2020  . Dyslipidemia   . Dysphagia 10/07/2011  . Encephalopathy   . Gait disorder   . HA (headache)   . High grade dysplasia in colonic adenoma 09/2005  . History of kidney stones   . HTN (hypertension), malignant 10/06/2011  . Hypernatremia   . Hypokalemia   . Hypothyroidism 10/08/2011  . Insomnia   . Junctional rhythm   . Kidney stones   . MS (multiple sclerosis) ([place])   . Neuromuscular disorder (HCC)    MS  . OSA (obstructive sleep apnea)   . Paroxysmal atrial tachycardia ([place])   . Peripheral vascular disease ([place])   . Pneumonia 4 yrs ago  . Pulmonary fibrosis ([place]) 10/06/2011  . Pulmonary nodule 10/08/2011  . PVD (peripheral vascular disease) ([place])   . Sacral ulcer ([place])   . Sleep apnea    cannot tolerate  . Stroke [facility])    left sided weakness  . Suprapubic catheter  ([place])   . TIA (transient ischemic attack)   . Tremors of nervous system 10/08/2011  . Urinary tract infection   . Ventral hernia without obstruction or gangrene     Large 8X9cm ventral hernia with loss of domain. CT reads report as diastasis recti with herniation or diastasis recti.  Dr. Constance Haw, Surgery, reviewed CT with radiology and there is herniation with only hernia sac or peritoneum over the bowel and large separation of the rectus muslce (i.e. diastasis recti aka loss of domain).  No surgical intervention recommended given size, age, and health.     Patient Active Problem List   Diagnosis Date Noted  . SIRS (systemic inflammatory response syndrome) ([place]) 05/03/2020  . Hydronephrosis 05/02/2020  . Complication of cystostomy catheter, subsequent encounter 04/20/2020  . Acute kidney injury superimposed on CKD ([place]) 03/02/2020  . Dehydration 03/02/2020  . Nausea and vomiting 03/02/2020  . Ventral hernia without obstruction or gangrene 03/01/2020  . Hypotension 03/01/2020  . Poor appetite 03/01/2020  . Urinary tract infection 03/01/2020  . Detrusor areflexia 09/16/2019  . Cerebrovascular disease 08/17/2018  . Partial small bowel obstruction ([place]) 07/11/2017  . Choledocholithiasis 07/11/2017  . Complicated UTI (urinary tract infection)   . Sepsis secondary to UTI ([place]) 06/14/2017  . Paroxysmal atrial tachycardia ([place]) 06/14/2017  . Hypophosphatemia 06/14/2017  . Pressure injury of skin 05/26/2017  . Hydronephrosis due to  obstruction of ureter 05/26/2017  . Obstructive uropathy 05/25/2017  . CKD (chronic kidney disease), stage III 08/23/2016  . Hydronephrosis of left kidney 08/23/2016  . Kidney stones 09/13/2015  . Essential hypertension   . Pressure ulcer 08/04/2015  . Chronic diastolic (congestive) heart failure ([place]) 08/04/2015  . Hydronephrosis with obstructing calculus 08/04/2015  . Occult blood positive stool 09/05/2014  . Rash and nonspecific skin eruption 08/25/2014  .  Edema 08/19/2014  . Subacute confusional state 08/06/2014  . Dilated cardiomyopathy ([place]) 07/21/2014  . Sepsis ([place]) 07/17/2014  . Severe sepsis ([place]) 07/17/2014  . Headache 03/13/2014  . CVA (cerebral infarction) 03/13/2014  . Abnormality of gait 03/13/2014  . Tremors of nervous system 10/08/2011  . Hypothyroidism 10/08/2011  . Pulmonary nodule 10/08/2011  . Dysphagia 10/07/2011  . HTN (hypertension), malignant 10/06/2011  . Chronic suprapubic catheter ([place]) 10/06/2011  . Pulmonary fibrosis ([place]) 10/06/2011  . UTI (urinary tract infection) 10/03/2011  . PNA (pneumonia) 10/02/2011  . Junctional rhythm 10/02/2011  . Multiple sclerosis ([place]) 10/02/2011  . Chronic diarrhea 06/17/2011  . Hx of adenomatous colonic polyps 06/17/2011  . High grade dysplasia in colonic adenoma 09/29/2005    Past Surgical History:  Procedure Laterality Date  . APPENDECTOMY  09/2005   at time of left hemicolectomy  . BACK SURGERY  1976/1979   lower  . BILIARY DILATION N/A 03/03/2020   Procedure: BILIARY DILATION;  Surgeon: Rogene Houston, MD;  Location: [facility] ENDO SU[facility];  Service: Endoscopy;  Laterality: N/A;  . CATARACT EXTRACTION W/PHACO Right 03/08/2018   Procedure: CATARACT EXTRACTION PHACO AND INTRAOCULAR LENS PLACEMENT RIGHT EYE;  Surgeon: Tonny Branch, MD;  Location: [facility] ORS;  Service: Ophthalmology;  Laterality: Right;  CDE: 8.86  . CATARACT EXTRACTION W/PHACO Left 04/05/2018   Procedure: CATARACT EXTRACTION PHACO AND INTRAOCULAR LENS PLACEMENT (IOC);  Surgeon: Tonny Branch, MD;  Location: [facility] ORS;  Service: Ophthalmology;  Laterality: Left;  CDE: 7.36  . CHOLECYSTECTOMY     Dr. Tamala Julian  . COLON SURGERY  09/2005   [place]ishman: four tubular adenomas, large adenomatous polyp with HIGH GRADE dysplasia  . COLONOSCOPY  11/2004   Dr. Sharol Roussel sessile polyp splenic flexure, 89mm sessile polyp desc colon, tubulovillous adenoma (bx not removed)  . COLONOSCOPY  01/2005   poor prep, polyp could not be found  .  COLONOSCOPY  05/2005   with EMR, polypectomy Dr. Olegario Messier, bx showed high grade dysplasia, partially resected  . COLONOSCOPY  09/2005   Dr. Arsenio Loader, [place] ink tattooing, four villous colon polyp (3 had been missed on previous colonoscopies due to limitations of procedures  . COLONOSCOPY  09/2006   normal TI, no polyps  . COLONOSCOPY  10/2007   Dr. Imogene Burn distal mammillations, benign bx, normal TI, random bx neg for microscopic colitis  . CYSTOSCOPY WITH LITHOLAPAXY N/A 07/27/2018   Procedure: CYSTOSCOPY WITH LITHOLAPAXY VIA  SUPRAPUBIC TUBE;  Surgeon: Franchot Gallo, MD;  Location: [facility] ORS;  Service: Urology;  Laterality: N/A;  . CYSTOSCOPY WITH RETROGRADE PYELOGRAM, URETEROSCOPY AND STENT PLACEMENT Left 06/09/2017   Procedure: CYSTOSCOPY WITH LEFT RETROGRADE PYELOGRAM, LEFT URETEROSCOPY, LEFT URETEROSCOPIC STONE EXTRACTION, LEFT URETERAL STENT PLACEMENT;  Surgeon: Franchot Gallo, MD;  Location: [facility] ORS;  Service: Urology;  Laterality: Left;  . ERCP N/A 03/03/2020   Procedure: ENDOSCOPIC RETROGRADE CHOLANGIOPANCREATOGRAPHY (ERCP);  Surgeon: Rogene Houston, MD;  Location: [facility];  Service: Endoscopy;  Laterality: N/A;  . ERCP N/A 04/20/2020   Procedure: ENDOSCOPIC RETROGRADE CHOLANGIOPANCREATOGRAPHY (ERCP);  Surgeon: Rogene Houston,  MD;  Location: [facility];  Service: Endoscopy;  Laterality: N/A;  to be done at 7:30am in OR  . GASTROINTESTINAL STENT REMOVAL N/A 04/20/2020   Procedure: STENT REMOVAL;  Surgeon: Rogene Houston, MD;  Location: [facility];  Service: Endoscopy;  Laterality: N/A;  . INGUINAL HERNIA REPAIR  1971   bilateral  . INSERTION OF SUPRAPUBIC CATHETER  06/09/2017   Procedure: EXCHANGE OF SUPRAPUBIC CATHETER;  Surgeon: Franchot Gallo, MD;  Location: [facility] ORS;  Service: Urology;;  . IR NEPHROSTOMY PLACEMENT LEFT  05/26/2017  . IR NEPHROSTOMY PLACEMENT LEFT  05/02/2020  . KIDNEY STONE SURGERY  09/13/2015  . LITHOTRIPSY N/A 03/03/2020   Procedure:  MECHANICAL LITHOTRIPSY WITH REMOVAL OF MULTIPLE STONE FRAGMENTS;  Surgeon: Rogene Houston, MD;  Location: [facility];  Service: Endoscopy;  Laterality: N/A;  . NEPHROLITHOTOMY Left 09/13/2015   Procedure: LEFT PERCUTANEOUS NEPHROLITHOTOMY ;  Surgeon: Franchot Gallo, MD;  Location: [facility] ORS;  Service: Urology;  Laterality: Left;  . REMOVAL OF STONES N/A 04/20/2020   Procedure: REMOVAL OF STONES;  Surgeon: Rogene Houston, MD;  Location: [facility];  Service: Endoscopy;  Laterality: N/A;  . SPHINCTEROTOMY  03/03/2020   Procedure: BILLARY SPHINCTEROTOMY;  Surgeon: Rogene Houston, MD;  Location: [facility];  Service: Endoscopy;;  . SUPRAPUBIC CATHETER INSERTION         Family History  Problem Relation Age of Onset  . Cirrhosis Brother        etoh  . Stroke Mother 57  . Coronary artery disease Father 25  . Heart attack Brother   . Cancer Sister   . Multiple sclerosis Other   . Colon cancer Neg Hx     Social History   Tobacco Use  . Smoking status: Former Smoker    Packs/day: 1.00    Years: 25.00    Pack years: 25.00    Types: Cigarettes    Quit date: 03/03/1988    Years since quitting: 32.1  . Smokeless tobacco: Never Used  Vaping Use  . Vaping Use: Never used  Substance Use Topics  . Alcohol use: No  . Drug use: No    Home Medications Prior to Admission medications   Medication Sig Start Date End Date Taking? Authorizing Provider  amLODipine (NORVASC) 5 MG tablet Take 5 mg by mouth daily.  04/02/20  Yes [provider]  carvedilol (COREG) 6.25 MG tablet Take 6.25 mg by mouth 2 (two) times daily with a meal.   Yes [provider]  diazepam (VALIUM) 5 MG tablet TAKE (1) TABLET BY MOUTH TWICE DAILY. 11/14/19  Yes Kathrynn Ducking, MD  HYDROcodone-acetaminophen (NORCO) 7.5-325 MG per tablet Take 1 tablet by mouth 2 (two) times daily. Max APAP 3 GM IN 24 HOURS FROM ALL SOURCES Patient taking differently: Take 1 tablet by mouth in the morning and at  bedtime. ALL MEDICATIONS TO BE CRUSHED AND PLACED IN APPLESAUCE 08/22/14  Yes Pandey, Mahima, MD  levothyroxine (SYNTHROID) 75 MCG tablet Take 75 mcg by mouth daily before breakfast. ALL MEDICATIONS TO BE CRUSHED AND PLACED IN APPLESAUCE 12/21/19  Yes [provider]  mirabegron ER (MYRBETRIQ) 25 MG TB24 tablet Take 25 mg by mouth daily as needed (bladder spasms).   Yes [provider]    Allergies    Tetracyclines & related and Ciprofloxacin  Review of Systems   Review of Systems Ten systems reviewed and are negative for acute change, except as noted in the HPI.  Physical Exam Updated Vital Signs BP (!) 147/59 (BP Location: Right Arm)   Pulse 79   Temp 99.4 F (37.4 C)   Resp 20   Ht 5\' 4"  (1.626 m)   Wt 51 kg   SpO2 98%   BMI 19.30 kg/m   Physical Exam Vitals and nursing note reviewed.  Constitutional:      General: He is not in acute distress.    Appearance: He is well-developed. He is not diaphoretic.     Comments: Shaking, teeth chattering, hot to touch  HENT:     Head: Normocephalic and atraumatic.  Eyes:     General: No scleral icterus.    Conjunctiva/sclera: Conjunctivae normal.  Cardiovascular:     Rate and Rhythm: Normal rate and regular rhythm.     Heart sounds: Normal heart sounds.  Pulmonary:     Effort: Pulmonary effort is normal. No respiratory distress.     Breath sounds: Normal breath sounds.  Abdominal:     Palpations: Abdomen is soft.     Tenderness: There is no abdominal tenderness. There is left CVA tenderness.     Comments: Soft ventral abdominal Bialecki hernia Suprapubic catheter in place.  Musculoskeletal:     Cervical back: Normal range of motion and neck supple.  Skin:    General: Skin is warm and dry.  Neurological:     Mental Status: He is alert.  Psychiatric:        Behavior: Behavior normal.     ED Results / Procedures / Treatments   Labs (all labs ordered are listed, but only abnormal results are  displayed) Labs Reviewed  COMPREHENSIVE METABOLIC PANEL - Abnormal; Notable for the following components:      Result Value   Potassium 3.4 (*)    Glucose, Bld 162 (*)    BUN 33 (*)    Creatinine, Ser 1.89 (*)    GFR calc non Af Amer 33 (*)    GFR calc Af Amer 39 (*)    All other components within normal limits  CBC WITH DIFFERENTIAL/PLATELET - Abnormal; Notable for the following components:   WBC 13.2 (*)    Neutro Abs 10.9 (*)    All other components within normal limits  COMPREHENSIVE METABOLIC PANEL - Abnormal; Notable for the following components:   Potassium 6.1 (*)    Glucose, Bld 127 (*)    BUN 30 (*)    Creatinine, Ser 2.61 (*)    Calcium 8.1 (*)    Total Protein 5.6 (*)    Albumin 3.4 (*)    AST 54 (*)    Total Bilirubin 2.4 (*)    GFR calc non Af Amer 23 (*)    GFR calc Af Amer 26 (*)    All other components within normal limits  CBC WITH DIFFERENTIAL/PLATELET - Abnormal; Notable for the following components:   WBC 18.8 (*)    RBC 4.04 (*)    Hemoglobin 12.8 (*)    Neutro Abs 16.1 (*)    Abs Immature Granulocytes 0.13 (*)    All other components within normal limits  URINALYSIS, ROUTINE W REFLEX MICROSCOPIC - Abnormal; Notable for the following components:   APPearance CLOUDY (*)    Hgb urine dipstick LARGE (*)    Protein, ur >=300 (*)    Leukocytes,Ua LARGE (*)    RBC / HPF >50 (*)    WBC, UA >50 (*)    Bacteria, UA MANY (*)    All other components within normal  limits  CULTURE, BLOOD (SINGLE)  CULTURE, BLOOD (SINGLE)  SARS CORONAVIRUS 2 BY RT PCR (HOSPITAL ORDER, PERFORMED IN [facility])  AEROBIC/ANAEROBIC CULTURE (SURGICAL/DEEP WOUND)  URINE CULTURE  LACTIC ACID, PLASMA  LACTIC ACID, PLASMA  PROTIME-INR  APTT  LACTIC ACID, PLASMA  LACTIC ACID, PLASMA  URINALYSIS, ROUTINE W REFLEX MICROSCOPIC  RENAL FUNCTION PANEL    EKG EKG Interpretation  Date/Time:  Wednesday May 02 2020 17:37:42 EDT Ventricular Rate:  76 PR Interval:     QRS Duration: 124 QT Interval:  450 QTC Calculation: 506 R Axis:   65 Text Interpretation: Sinus rhythm Nonspecific intraventricular conduction delay Inferior infarct, age indeterminate Repol abnrm suggests ischemia, diffuse leads When compared with ECG of 04/18/2020, No significant change was found Confirmed by Delora Fuel ([id number]) on 05/02/2020 6:30:07 PM   Radiology DG Chest Port 1 View  Result Date: 05/02/2020 CLINICAL DATA:  Sepsis. EXAM: PORTABLE CHEST 1 VIEW COMPARISON:  Ten thousand eighteen FINDINGS: Chronic bronchitic changes are noted bilaterally. There is no pneumothorax. No pleural effusion. No acute osseous abnormality. There are old healed left-sided rib fractures. The heart size is unchanged. Aortic calcifications are noted. There is a lucency projecting over the partially visualized liver shadow. IMPRESSION: 1. No acute cardiopulmonary process. 2. Lucency projecting over the partially visualized liver shadow. This may represent pneumobilia, perhaps from recent sphincterotomy. Correlation with a dedicated abdominal radiograph is recommended. Electronically Signed   By: Constance Holster M.D.   On: 05/02/2020 17:35   CT Renal Stone Study  Result Date: 05/02/2020 CLINICAL DATA:  Flank pain. Chronic suprapubic catheter replaced last week. EXAM: CT ABDOMEN AND PELVIS WITHOUT CONTRAST TECHNIQUE: Multidetector CT imaging of the abdomen and pelvis was performed following the standard protocol without IV contrast. COMPARISON:  CT dated March 01, 2020 FINDINGS: Lower chest: There are chronic changes at the lung bases bilaterally with areas of subpleural reticulation.The heart size is normal. Hepatobiliary: The liver is normal. Status post cholecystectomy.There is pneumobilia primarily within the left hepatic lobe. This corresponds well with the finding seen on the patient's recent chest x-ray and is likely related to prior sphincterotomy. Pancreas: Normal contours without ductal dilatation. No  peripancreatic fluid collection. Spleen: Unremarkable. Adrenals/Urinary Tract: --Adrenal glands: Unremarkable. --Right kidney/ureter: The right kidney is atrophic without evidence for hydronephrosis. There are nonobstructing stones in the lower pole the right kidney. --Left kidney/ureter: There is new severe left-sided hydroureteronephrosis secondary to an obstructing stone in the distal left ureter measuring approximately 8 mm (axial series 2, image 57). There may be an additional stone fragment at the left UVJ measuring approximately 5 mm (axial series 2, image 70). There are residual stones in the left kidney measuring up to approximately 1 cm in the lower pole. --Urinary bladder: The bladder is decompressed with a suprapubic catheter. The catheter appears to be grossly well position. There is hyperdense material within the collapsed urinary bladder which may represent contrast or stone material/gravel. Stomach/Bowel: --Stomach/Duodenum: No hiatal hernia or other gastric abnormality. Normal duodenal course and caliber. --Small bowel: Unremarkable. --Colon: The patient is status post prior left hemicolectomy. There is a surgical anastomosis in the patient's pelvis that is patent. --Appendix: Surgically absent. Vascular/Lymphatic: Atherosclerotic calcification is present within the non-aneurysmal abdominal aorta, without hemodynamically significant stenosis. 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 --No retroperitoneal lymphadenopathy. --No mesenteric lymphadenopathy. --No pelvic or inguinal lymphadenopathy. Reproductive: Unremarkable Other: Again noted is significant diastasis of the low anterior abdominal Brenes.  Musculoskeletal. 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 IMPRESSION: 1. New severe left-sided hydroureteronephrosis secondary to an obstructing stone in the distal left ureter measuring approximately 8 mm. There may be an  additional stone fragment at the left UVJ measuring approximately 5 mm. 2. Bilateral nonobstructing nephrolithiasis.  Atrophic right kidney. 3. The bladder is decompressed with a suprapubic catheter. There is hyperdense material within the collapsed urinary bladder which may represent contrast or stone material/gravel. 4. Pneumobilia, likely related to interval sphincterotomy. This explains the finding seen on the patient's recent chest x-ray. 5. Avascular necrosis of the right femoral head. 6. 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 7. Additional chronic findings as detailed above. Aortic Atherosclerosis (ICD10-I70.0). Electronically Signed   By: Constance Holster M.D.   On: 05/02/2020 19:28   IR NEPHROSTOMY PLACEMENT LEFT  Result Date: 05/03/2020 INDICATION: 79 year old male with a history of solitary left kidney and hydronephrosis secondary to obstructive left ureteral stone EXAM: IR NEPHROSTOMY PLACEMENT LEFT COMPARISON:  None. MEDICATIONS: None ANESTHESIA/SEDATION: Fentanyl 1.0 mcg IV; Versed 50 mg IV Moderate Sedation Time:  10 minutes The patient was continuously monitored during the procedure by the interventional radiology nurse under my direct supervision. CONTRAST:  58mL OMNIPAQUE IOHEXOL 300 MG/ML SOLN - administered into the collecting system(s) FLUOROSCOPY TIME:  Fluoroscopy Time: 0 minutes 24 seconds (3 mGy). COMPLICATIONS: None PROCEDURE: Informed written consent was obtained from the patient after a thorough discussion of the procedural risks, benefits and alternatives. All questions were addressed. Maximal Sterile Barrier Technique was utilized including caps, mask, sterile gowns, sterile gloves, sterile drape, hand hygiene and skin antiseptic. A timeout was performed prior to the initiation of the procedure. Patient positioned prone position on the fluoroscopy table. Ultrasound survey of the left flank was performed with  images stored and sent to PACs. The patient was then prepped and draped in the usual sterile fashion. 1% lidocaine was used to anesthetize the skin and subcutaneous tissues for local anesthesia. A Chiba needle was then used to access a posterior inferior calyx with ultrasound guidance. With spontaneous urine returned through the needle, passage of an 018 micro wire into the collecting system was performed under fluoroscopy. A small incision was made with an 11 blade scalpel, and the needle was removed from the wire. An Envy system was then advanced over the wire into the collecting system under fluoroscopy. The metal stiffener and inner dilator were removed, and then a sample of fluid was aspirated through the 4 French outer sheath. Bentson wire was passed into the collecting system and the sheath removed. Ten French dilation of the soft tissues was performed. Using modified Seldinger technique, a 10 French pigtail catheter drain was placed over the Bentson wire. Wire and inner stiffener removed, and the pigtail was formed in the collecting system. Small amount of contrast confirmed position of the catheter. Patient tolerated the procedure well and remained hemodynamically stable throughout. No complications were encountered and no significant blood loss encountered IMPRESSION: Status post image guided left-sided percutaneous nephrostomy. Signed, Dulcy Fanny. Dellia Nims, RPVI [facility] Radiology Electronically Signed   By: Corrie Mckusick D.O.   On: 05/03/2020 08:56    Procedures .Critical Care Performed by: Margarita Mail, PA-C Authorized by: Margarita Mail, PA-C   Critical care provider statement:    Critical care time (minutes):  70   Critical care was necessary to treat or prevent imminent or life-threatening deterioration of the  following conditions:  Sepsis   Critical care was time spent personally by me on the following activities:  Discussions with  consultants, evaluation of patient's response to treatment, examination of patient, ordering and performing treatments and interventions, ordering and review of laboratory studies, ordering and review of radiographic studies, pulse oximetry, re-evaluation of patient's condition, obtaining history from patient or surrogate and review of old charts   (including critical care time)  Medications Ordered in ED Medications  vancomycin (VANCOREADY) IVPB 500 mg/100 mL (has no administration in time range)  ceFEPIme (MAXIPIME) 2 g in sodium chloride 0.9 % 100 mL IVPB (0 g Intravenous Stopped 05/02/20 1931)  lidocaine (XYLOCAINE) 1 % (with pres) injection (has no administration in time range)  midazolam (VERSED) 2 MG/2ML injection (has no administration in time range)  fentaNYL (SUBLIMAZE) 100 MCG/2ML injection (has no administration in time range)  amLODipine (NORVASC) tablet 5 mg (5 mg Oral Given by Other 05/03/20 1057)  carvedilol (COREG) tablet 6.25 mg (6.25 mg Oral Given by Other 05/03/20 1055)  diazepam (VALIUM) tablet 5 mg (5 mg Oral Given 05/03/20 0221)  levothyroxine (SYNTHROID) tablet 75 mcg (75 mcg Oral Given 05/03/20 0543)  mirabegron ER (MYRBETRIQ) tablet 25 mg (25 mg Oral Given 05/03/20 0221)  ondansetron (ZOFRAN) tablet 4 mg (has no administration in time range)    Or  ondansetron (ZOFRAN) injection 4 mg (has no administration in time range)  acetaminophen (TYLENOL) tablet 650 mg (has no administration in time range)    Or  acetaminophen (TYLENOL) suppository 650 mg (has no administration in time range)  HYDROcodone-acetaminophen (NORCO) 7.5-325 MG per tablet 1 tablet (1 tablet Oral Given by Other 05/03/20 1057)  0.9 %  sodium chloride infusion ( Intravenous New Bag/Given 05/03/20 1111)  metroNIDAZOLE (FLAGYL) IVPB 500 mg (0 mg Intravenous Stopped 05/02/20 2241)  vancomycin (VANCOCIN) IVPB 1000 mg/200 mL premix (0 mg Intravenous Stopped 05/02/20 2242)  acetaminophen (TYLENOL) tablet 650 mg (650 mg Oral  Given 05/02/20 1815)  sodium chloride 0.9 % bolus 1,000 mL (0 mLs Intravenous Stopped 05/02/20 2015)  fentaNYL (SUBLIMAZE) injection 50 mcg (50 mcg Intravenous Given 05/02/20 1935)  ondansetron (ZOFRAN) injection 4 mg (4 mg Intravenous Given 05/02/20 1935)  HYDROmorphone (DILAUDID) injection 1 mg (1 mg Intravenous Given 05/02/20 2045)  fentaNYL (SUBLIMAZE) injection 50 mcg (50 mcg Intravenous Given 05/02/20 2242)  midazolam (VERSED) injection (1 mg Intravenous Given 05/02/20 2354)  fentaNYL (SUBLIMAZE) injection (50 mcg Intravenous Given 05/02/20 2354)  iohexol (OMNIPAQUE) 300 MG/ML solution 50 mL (20 mLs Other Contrast Given 05/03/20 0002)  sodium polystyrene (KAYEXALATE) 15 GM/60ML suspension 45 g (45 g Oral Given by Other 05/03/20 1059)    ED Course  I have reviewed the triage vital signs and the nursing notes.  Pertinent labs & imaging results that were available during my care of the patient were reviewed by me and considered in my medical decision making (see chart for details).  Clinical Course as of May 03 1241  Wed May 02, 2020  1929 Creatinine(!): 1.89 [AH]  1929 Lactic Acid, Venous: 1.7 [AH]  2058 Joann Zito Wife would like updates  [phone]   [AH]    Clinical Course User Index [AH] Margarita Mail, PA-C   MDM Rules/Calculators/A&P                          79 year old male who presents the emergency department with severe urinary urgency and no output from his suprapubic catheter since 12:00. Patient noted  to be shivering uncontrollably.  His initial oral temperature was within normal limits however patient appeared hot to the touch and rectal temperature confirms fever.  Code sepsis initiated.  I withheld initial fluids as I had concern for urinary obstruction. The patient catheter was changed.  We then flushed the catheter with return.  I also did a bedside ultrasound which showed no significant amount of urine within the urinary bladder.  This may be concern for potential obstruction  above the bladder.  Patient underwent CT renal stone study which I ordered interpreted and reviewed which shows atrophic right kidney, multiple kidney stones and large obstructing left-sided urinary stone.  Labs show elevated white blood cell count of 18,000.  CMP with mild hypokalemia, creatinine just above the patient's baseline at this time.  Urine is pending.  Patient treated with broad-spectrum antibiotics however my suspicion is that the patient has infected urine given the appearance of returned fluid from irrigation of the bladder.  Patient was given fluids however had increasing pain due to obstruction of the kidney from large stone and this was discontinued.  I personally reviewed the patient's one-view portable chest x-ray which shows no acute abnormalities.   I discussed the case with Dr. Noah Delaine of urology.  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  Due to the urgency of the situation giving his sepsis from in infected kidney stone and single functioning left kidney with obstruction I have discussed the case with Dr. Malvin Johns who is excepted the patient in ED to ED transfer to expedite relief of the left kidney.  I discussed this with the patient and his wife at bedside.  Pain has been unfortunately poorly controlled with multiple narcotics here in the emergency department.  Patient critically ill in the emergency department requiring immediate transfer for intervention on the left kidney and sepsis due to likely source of urinary tract infection due to obstruction.  The patient has remained hemodynamically stable and is safe for transfer.     Final Clinical Impression(s) / ED Diagnoses Final diagnoses:  Sepsis [facility])    Rx / [place] Orders ED Discharge Orders    None       Margarita Mail, PA-C 27/03/50 0938    Delora Fuel, MD 18/29/93 1345

## 2020-05-02 NOTE — ED Notes (Signed)
Supra pubic cath replaced by MD, no urine out, irrigated foley with NS, same amount in and out, PA at bedside with ultrasound, per PA bladder appears void of urine or liquid, empty.

## 2020-05-02 NOTE — ED Notes (Signed)
Per Charge who took report. Pt arrived from [facility] left ureter obstruction  Hx Kidney stones, Right kidney removal

## 2020-05-02 NOTE — ED Triage Notes (Signed)
Pt reports had chronic suprapubic catheter replaced several weeks ago. Pt reports last void this am. Pt reports intense pain and generalized shakes that started a few hours ago. Pt wife reports pt has history of kidney stones and states"it has blocked it before." pt denies nay n/v/fever.

## 2020-05-02 NOTE — Progress Notes (Signed)
Pharmacy Antibiotic Note  Jordan Ward is a 79 y.o. male presented to [facility] ED on 05/02/20; he had chronic suprapubic catheter replaced several weeks ago. Pharmacy has been consulted for vancomycin and cefepime dosing for infection of unknown source.  WBC 13.2, Tmax 101.1 F, Scr 1.89 (up from 1.3-1.6 in 02/2020), CrCl 23.6 ml/min  Plan: Cefepime 2 gm IV Q 24 hrs Vancomycin 1 gm IV X 1, followed by vancomycin 500 mg IV Q 24 hrs (goal vancomycin trough: 15-20 mg/L) Monitor WBC, temp, clinical improvement, cultures, renal function, vancomycin levels as indicated  Height: 5\' 4"  (162.6 cm) Weight: 51 kg (112 lb 7 oz) IBW/kg (Calculated) : 59.2  Temp (24hrs), Avg:99.8 F (37.7 C), Min:98.4 F (36.9 C), Max:101.1 F (38.4 C)  Recent Labs  Lab 05/02/20 1718  WBC 13.2*  CREATININE 1.89*  LATICACIDVEN 1.7    Estimated Creatinine Clearance: 23.6 mL/min (A) (by C-G formula based on SCr of 1.89 mg/dL (H)).    Allergies  Allergen Reactions   Tetracyclines & Related Anaphylaxis and Rash   Ciprofloxacin     Trouble swallowing unknown reaction according to wife     Antimicrobials this admission: 8/4 vancomycin 8/4 cefepime 8/4 metronidazole  Microbiology results: 8/4  BCx X1: pending 6/3 UCx:  >100,000 colonies/ml Morganella morganii, pan sensitive  Thank you for allowing pharmacy to be a part of this patients care.  Gillermina Hu, PharmD, BCPS, [facility] Clinical Pharmacist 05/02/2020 6:21 PM

## 2020-05-03 ENCOUNTER — Encounter (HOSPITAL_COMMUNITY): Payer: Self-pay | Admitting: Internal Medicine

## 2020-05-03 DIAGNOSIS — E876 Hypokalemia: Secondary | ICD-10-CM | POA: Diagnosis not present

## 2020-05-03 DIAGNOSIS — N1831 Chronic kidney disease, stage 3a: Secondary | ICD-10-CM | POA: Diagnosis not present

## 2020-05-03 DIAGNOSIS — N189 Chronic kidney disease, unspecified: Secondary | ICD-10-CM | POA: Diagnosis not present

## 2020-05-03 DIAGNOSIS — E785 Hyperlipidemia, unspecified: Secondary | ICD-10-CM | POA: Diagnosis present

## 2020-05-03 DIAGNOSIS — I739 Peripheral vascular disease, unspecified: Secondary | ICD-10-CM | POA: Diagnosis present

## 2020-05-03 DIAGNOSIS — R6510 Systemic inflammatory response syndrome (SIRS) of non-infectious origin without acute organ dysfunction: Secondary | ICD-10-CM | POA: Insufficient documentation

## 2020-05-03 DIAGNOSIS — N1330 Unspecified hydronephrosis: Secondary | ICD-10-CM | POA: Diagnosis not present

## 2020-05-03 DIAGNOSIS — M549 Dorsalgia, unspecified: Secondary | ICD-10-CM | POA: Diagnosis present

## 2020-05-03 DIAGNOSIS — R7881 Bacteremia: Secondary | ICD-10-CM

## 2020-05-03 DIAGNOSIS — N179 Acute kidney failure, unspecified: Secondary | ICD-10-CM

## 2020-05-03 DIAGNOSIS — Z20822 Contact with and (suspected) exposure to covid-19: Secondary | ICD-10-CM | POA: Diagnosis present

## 2020-05-03 DIAGNOSIS — G8929 Other chronic pain: Secondary | ICD-10-CM | POA: Diagnosis present

## 2020-05-03 DIAGNOSIS — I2510 Atherosclerotic heart disease of native coronary artery without angina pectoris: Secondary | ICD-10-CM | POA: Diagnosis present

## 2020-05-03 DIAGNOSIS — E875 Hyperkalemia: Secondary | ICD-10-CM | POA: Diagnosis present

## 2020-05-03 DIAGNOSIS — E039 Hypothyroidism, unspecified: Secondary | ICD-10-CM | POA: Diagnosis present

## 2020-05-03 DIAGNOSIS — N136 Pyonephrosis: Secondary | ICD-10-CM | POA: Diagnosis present

## 2020-05-03 DIAGNOSIS — Z9359 Other cystostomy status: Secondary | ICD-10-CM | POA: Diagnosis not present

## 2020-05-03 DIAGNOSIS — N319 Neuromuscular dysfunction of bladder, unspecified: Secondary | ICD-10-CM | POA: Diagnosis present

## 2020-05-03 DIAGNOSIS — A4159 Other Gram-negative sepsis: Secondary | ICD-10-CM | POA: Diagnosis present

## 2020-05-03 DIAGNOSIS — N390 Urinary tract infection, site not specified: Secondary | ICD-10-CM | POA: Diagnosis not present

## 2020-05-03 DIAGNOSIS — G4733 Obstructive sleep apnea (adult) (pediatric): Secondary | ICD-10-CM | POA: Diagnosis present

## 2020-05-03 DIAGNOSIS — E870 Hyperosmolality and hypernatremia: Secondary | ICD-10-CM | POA: Diagnosis present

## 2020-05-03 DIAGNOSIS — N132 Hydronephrosis with renal and ureteral calculous obstruction: Secondary | ICD-10-CM

## 2020-05-03 DIAGNOSIS — G4700 Insomnia, unspecified: Secondary | ICD-10-CM | POA: Diagnosis present

## 2020-05-03 DIAGNOSIS — I129 Hypertensive chronic kidney disease with stage 1 through stage 4 chronic kidney disease, or unspecified chronic kidney disease: Secondary | ICD-10-CM | POA: Diagnosis present

## 2020-05-03 DIAGNOSIS — L8991 Pressure ulcer of unspecified site, stage 1: Secondary | ICD-10-CM | POA: Diagnosis not present

## 2020-05-03 DIAGNOSIS — A419 Sepsis, unspecified organism: Secondary | ICD-10-CM | POA: Diagnosis not present

## 2020-05-03 DIAGNOSIS — Z961 Presence of intraocular lens: Secondary | ICD-10-CM | POA: Diagnosis present

## 2020-05-03 DIAGNOSIS — I420 Dilated cardiomyopathy: Secondary | ICD-10-CM | POA: Diagnosis present

## 2020-05-03 DIAGNOSIS — R251 Tremor, unspecified: Secondary | ICD-10-CM | POA: Diagnosis present

## 2020-05-03 DIAGNOSIS — N139 Obstructive and reflux uropathy, unspecified: Secondary | ICD-10-CM | POA: Diagnosis not present

## 2020-05-03 DIAGNOSIS — G35 Multiple sclerosis: Secondary | ICD-10-CM | POA: Diagnosis not present

## 2020-05-03 DIAGNOSIS — E860 Dehydration: Secondary | ICD-10-CM | POA: Diagnosis not present

## 2020-05-03 DIAGNOSIS — N201 Calculus of ureter: Secondary | ICD-10-CM | POA: Diagnosis not present

## 2020-05-03 DIAGNOSIS — L89891 Pressure ulcer of other site, stage 1: Secondary | ICD-10-CM | POA: Diagnosis present

## 2020-05-03 DIAGNOSIS — J8410 Pulmonary fibrosis, unspecified: Secondary | ICD-10-CM | POA: Diagnosis not present

## 2020-05-03 LAB — URINALYSIS, ROUTINE W REFLEX MICROSCOPIC
Bilirubin Urine: NEGATIVE
Glucose, UA: NEGATIVE mg/dL
Ketones, ur: NEGATIVE mg/dL
Nitrite: NEGATIVE
Protein, ur: >=300 mg/dL — AB
RBC / HPF: >50 RBC/hpf — ABNORMAL HIGH (ref 0–5)
Specific Gravity, Urine: 1.007 (ref 1.005–1.030)
WBC, UA: >50 WBC/hpf — ABNORMAL HIGH (ref 0–5)
pH: 8 (ref 5.0–8.0)

## 2020-05-03 LAB — CBC WITH DIFFERENTIAL/PLATELET
Abs Immature Granulocytes: 0.13 10*3/uL — ABNORMAL HIGH (ref 0.00–0.07)
Basophils Absolute: 0.1 10*3/uL (ref 0.0–0.1)
Basophils Relative: 0 %
Eosinophils Absolute: 0.1 10*3/uL (ref 0.0–0.5)
Eosinophils Relative: 1 %
HCT: 39.7 % (ref 39.0–52.0)
Hemoglobin: 12.8 g/dL — ABNORMAL LOW (ref 13.0–17.0)
Immature Granulocytes: 1 %
Lymphocytes Relative: 8 %
Lymphs Abs: 1.5 10*3/uL (ref 0.7–4.0)
MCH: 31.7 pg (ref 26.0–34.0)
MCHC: 32.2 g/dL (ref 30.0–36.0)
MCV: 98.3 fL (ref 80.0–100.0)
Monocytes Absolute: 1 10*3/uL (ref 0.1–1.0)
Monocytes Relative: 6 %
Neutro Abs: 16.1 10*3/uL — ABNORMAL HIGH (ref 1.7–7.7)
Neutrophils Relative %: 84 %
Platelets: 160 10*3/uL (ref 150–400)
RBC: 4.04 MIL/uL — ABNORMAL LOW (ref 4.22–5.81)
RDW: 13.9 % (ref 11.5–15.5)
WBC: 18.8 10*3/uL — ABNORMAL HIGH (ref 4.0–10.5)
nRBC: 0 % (ref 0.0–0.2)

## 2020-05-03 LAB — BLOOD CULTURE ID PANEL (REFLEXED) - BCID2

## 2020-05-03 LAB — RENAL FUNCTION PANEL
Albumin: 3.6 g/dL (ref 3.5–5.0)
Anion gap: 12 (ref 5–15)
BUN: 30 mg/dL — ABNORMAL HIGH (ref 8–23)
CO2: 26 mmol/L (ref 22–32)
Calcium: 8.6 mg/dL — ABNORMAL LOW (ref 8.9–10.3)
Chloride: 109 mmol/L (ref 98–111)
Creatinine, Ser: 2.17 mg/dL — ABNORMAL HIGH (ref 0.61–1.24)
GFR calc Af Amer: 33 mL/min — ABNORMAL LOW (ref >60)
GFR calc non Af Amer: 28 mL/min — ABNORMAL LOW (ref >60)
Glucose, Bld: 140 mg/dL — ABNORMAL HIGH (ref 70–99)
Phosphorus: 2.5 mg/dL (ref 2.5–4.6)
Potassium: 3.1 mmol/L — ABNORMAL LOW (ref 3.5–5.1)
Sodium: 147 mmol/L — ABNORMAL HIGH (ref 135–145)

## 2020-05-03 LAB — COMPREHENSIVE METABOLIC PANEL
ALT: 20 U/L (ref 0–44)
AST: 54 U/L — ABNORMAL HIGH (ref 15–41)
Albumin: 3.4 g/dL — ABNORMAL LOW (ref 3.5–5.0)
Alkaline Phosphatase: 70 U/L (ref 38–126)
Anion gap: 7 (ref 5–15)
BUN: 30 mg/dL — ABNORMAL HIGH (ref 8–23)
CO2: 24 mmol/L (ref 22–32)
Calcium: 8.1 mg/dL — ABNORMAL LOW (ref 8.9–10.3)
Chloride: 108 mmol/L (ref 98–111)
Creatinine, Ser: 2.61 mg/dL — ABNORMAL HIGH (ref 0.61–1.24)
GFR calc Af Amer: 26 mL/min — ABNORMAL LOW (ref >60)
GFR calc non Af Amer: 23 mL/min — ABNORMAL LOW (ref >60)
Glucose, Bld: 127 mg/dL — ABNORMAL HIGH (ref 70–99)
Potassium: 6.1 mmol/L — ABNORMAL HIGH (ref 3.5–5.1)
Sodium: 139 mmol/L (ref 135–145)
Total Bilirubin: 2.4 mg/dL — ABNORMAL HIGH (ref 0.3–1.2)
Total Protein: 5.6 g/dL — ABNORMAL LOW (ref 6.5–8.1)

## 2020-05-03 LAB — LACTIC ACID, PLASMA
Lactic Acid, Venous: 1.2 mmol/L (ref 0.5–1.9)
Lactic Acid, Venous: 1.3 mmol/L (ref 0.5–1.9)

## 2020-05-03 MED ORDER — HYDROCODONE-ACETAMINOPHEN 7.5-325 MG PO TABS
1.0000 | ORAL_TABLET | Freq: Four times a day (QID) | ORAL | Status: DC | PRN
  Administered 2020-05-03 – 2020-05-07 (×13): 1 via ORAL
  Filled 2020-05-03 (×13): qty 1

## 2020-05-03 MED ORDER — SODIUM CHLORIDE 0.9 % IV SOLN: INTRAVENOUS | Status: DC

## 2020-05-03 MED ORDER — MEPERIDINE HCL 100 MG/ML IJ SOLN
INTRAMUSCULAR | Status: AC
  Filled 2020-05-03: qty 1

## 2020-05-03 MED ORDER — ONDANSETRON HCL 4 MG/2ML IJ SOLN
4.0000 mg | Freq: Four times a day (QID) | INTRAMUSCULAR | Status: DC | PRN
  Filled 2020-05-03: qty 2

## 2020-05-03 MED ORDER — SODIUM CHLORIDE 0.45 % IV SOLN: INTRAVENOUS | Status: DC

## 2020-05-03 MED ORDER — ONDANSETRON HCL 4 MG/2ML IJ SOLN: 4.0000 mg | Freq: Once | INTRAMUSCULAR | Status: DC | PRN

## 2020-05-03 MED ORDER — POTASSIUM CHLORIDE CRYS ER 20 MEQ PO TBCR
20.0000 meq | EXTENDED_RELEASE_TABLET | Freq: Once | ORAL | Status: AC
  Administered 2020-05-03: 20 meq via ORAL
  Filled 2020-05-03: qty 1

## 2020-05-03 MED ORDER — MIRABEGRON ER 25 MG PO TB24
25.0000 mg | ORAL_TABLET | Freq: Every day | ORAL | Status: DC | PRN
  Administered 2020-05-03 – 2020-05-06 (×2): 25 mg via ORAL
  Filled 2020-05-03 (×5): qty 1

## 2020-05-03 MED ORDER — ONDANSETRON HCL 4 MG/2ML IJ SOLN
INTRAMUSCULAR | Status: AC
  Filled 2020-05-03: qty 2

## 2020-05-03 MED ORDER — ACETAMINOPHEN 325 MG PO TABS
650.0000 mg | ORAL_TABLET | Freq: Four times a day (QID) | ORAL | Status: DC | PRN
  Filled 2020-05-03: qty 2

## 2020-05-03 MED ORDER — MEPERIDINE HCL 25 MG/ML IJ SOLN
25.0000 mg | Freq: Once | INTRAMUSCULAR | Status: DC
  Filled 2020-05-03: qty 1

## 2020-05-03 MED ORDER — ONDANSETRON HCL 4 MG/2ML IJ SOLN
4.0000 mg | Freq: Once | INTRAMUSCULAR | Status: AC
  Administered 2020-05-03: 4 mg via INTRAVENOUS

## 2020-05-03 MED ORDER — AMLODIPINE BESYLATE 5 MG PO TABS
5.0000 mg | ORAL_TABLET | Freq: Every day | ORAL | Status: DC
  Administered 2020-05-03 – 2020-05-04 (×2): 5 mg via ORAL
  Filled 2020-05-03 (×3): qty 1

## 2020-05-03 MED ORDER — ONDANSETRON HCL 4 MG/2ML IJ SOLN: 4.0000 mg | Freq: Once | INTRAMUSCULAR | Status: DC

## 2020-05-03 MED ORDER — CARVEDILOL 6.25 MG PO TABS
6.2500 mg | ORAL_TABLET | Freq: Two times a day (BID) | ORAL | Status: DC
  Administered 2020-05-03 – 2020-05-07 (×9): 6.25 mg via ORAL
  Filled 2020-05-03 (×6): qty 1
  Filled 2020-05-03: qty 2
  Filled 2020-05-03 (×2): qty 1

## 2020-05-03 MED ORDER — SODIUM POLYSTYRENE SULFONATE 15 GM/60ML PO SUSP
45.0000 g | Freq: Once | ORAL | Status: AC
  Administered 2020-05-03: 45 g via ORAL
  Filled 2020-05-03: qty 180

## 2020-05-03 MED ORDER — ACETAMINOPHEN 650 MG RE SUPP: 650.0000 mg | Freq: Four times a day (QID) | RECTAL | Status: DC | PRN

## 2020-05-03 MED ORDER — LEVOTHYROXINE SODIUM 75 MCG PO TABS
75.0000 ug | ORAL_TABLET | Freq: Every day | ORAL | Status: DC
  Administered 2020-05-03 – 2020-05-07 (×5): 75 ug via ORAL
  Filled 2020-05-03 (×5): qty 1

## 2020-05-03 MED ORDER — DIAZEPAM 5 MG PO TABS
5.0000 mg | ORAL_TABLET | Freq: Two times a day (BID) | ORAL | Status: DC | PRN
  Administered 2020-05-03 – 2020-05-05 (×3): 5 mg via ORAL
  Filled 2020-05-03 (×3): qty 1

## 2020-05-03 MED ORDER — MEPERIDINE HCL 25 MG/ML IJ SOLN: 25.0000 mg | Freq: Once | INTRAMUSCULAR | Status: DC | PRN

## 2020-05-03 MED ORDER — ONDANSETRON HCL 4 MG PO TABS: 4.0000 mg | ORAL_TABLET | Freq: Four times a day (QID) | ORAL | Status: DC | PRN

## 2020-05-03 NOTE — Progress Notes (Addendum)
I have seen and assessed patient and agree with Dr. Moise Boring assessment and plan.  Patient 79 year old gentleman history of multiple sclerosis, suprapubic catheter recently changed 2 weeks prior to admission, hypothyroidism, obstructive sleep apnea recent admission in June for CBD stone status post ERCP with sphincterectomy presenting with decreased urine output x1 day, flank pain, chills, rigors.  Patient seen in the ED at [facility] noted to be febrile with a temp of 101.1, CT renal stone protocol with an acute left-sided hydroureteronephrosis with ureteral stone.  Case discussed with urologist on-call who requested nephrostomy tube placement urgently in the setting of possible developing sepsis and patient transferred to [facility] long hospital and nephrostomy tube placed by IR, Dr. Earleen Newport.  Patient started empirically on IV antibiotics for UTI.  Patient pancultured.  Preliminary blood cultures, BC ID positive for Proteus species.  Urine cultures pending.  Patient was initially on IV vancomycin IV cefepime.  As blood cultures growing a Proteus species will discontinue IV vancomycin.  Continue IV cefepime pending finalization of cultures.  We will give Kayexalate for hyperkalemia.  Repeat labs this afternoon.  Continue supportive care.  Urology following.  No charge.

## 2020-05-03 NOTE — Progress Notes (Signed)
Pt refused cpap for tonight 

## 2020-05-03 NOTE — Procedures (Addendum)
Interventional Radiology Procedure Note  Procedure: Image guided drain placement, left PCN.  65F pigtail drain.  Complications: None  EBL: None Sample: Culture sent  Recommendations: - Routine drain care, record output - follow up Cx - routine wound care - if rigors develop --> IV fluids, 4mg  IV zofran, 25mg  demerol, consider further ABX  Signed,  Dulcy Fanny. Earleen Newport, DO

## 2020-05-03 NOTE — Progress Notes (Signed)
PHARMACY - PHYSICIAN COMMUNICATION CRITICAL VALUE ALERT - BLOOD CULTURE IDENTIFICATION (BCID)  Jordan Ward is an 79 y.o. male with a h/o who presented to [facility] on 05/02/2020 with a chief complaint of ureteral stones  Assessment:  GNR in PCN urine and blood. BCID resulted with Proteus species with no resistance detected  Name of physician (or Provider) Contacted: Dr. Grandville Silos  Current antibiotics: cefepime and vancomycin  Changes to prescribed antibiotics recommended:  D/C vancomycin and change cefepime to ceftriaxone vs continue same until final culture results - continue cefepime until final culture results per Dr. Grandville Silos  Results for orders placed or performed during the hospital encounter of 05/02/20  Blood Culture ID Panel (Reflexed) (Collected: 05/02/2020  6:11 PM)  Result Value Ref Range   Enterococcus faecalis NOT DETECTED NOT DETECTED   Enterococcus Faecium NOT DETECTED NOT DETECTED   Listeria monocytogenes NOT DETECTED NOT DETECTED   Staphylococcus species NOT DETECTED NOT DETECTED   Staphylococcus aureus (BCID) NOT DETECTED NOT DETECTED   Staphylococcus epidermidis NOT DETECTED NOT DETECTED   Staphylococcus lugdunensis NOT DETECTED NOT DETECTED   Streptococcus species NOT DETECTED NOT DETECTED   Streptococcus agalactiae NOT DETECTED NOT DETECTED   Streptococcus pneumoniae NOT DETECTED NOT DETECTED   Streptococcus pyogenes NOT DETECTED NOT DETECTED   A.calcoaceticus-baumannii NOT DETECTED NOT DETECTED   Bacteroides fragilis NOT DETECTED NOT DETECTED   Enterobacterales DETECTED (A) NOT DETECTED   Enterobacter cloacae complex NOT DETECTED NOT DETECTED   Escherichia coli NOT DETECTED NOT DETECTED   Klebsiella aerogenes NOT DETECTED NOT DETECTED   Klebsiella oxytoca NOT DETECTED NOT DETECTED   Klebsiella pneumoniae NOT DETECTED NOT DETECTED   Proteus species DETECTED (A) NOT DETECTED   Salmonella species NOT DETECTED NOT DETECTED   Serratia marcescens NOT DETECTED NOT  DETECTED   Haemophilus influenzae NOT DETECTED NOT DETECTED   Neisseria meningitidis NOT DETECTED NOT DETECTED   Pseudomonas aeruginosa NOT DETECTED NOT DETECTED   Stenotrophomonas maltophilia NOT DETECTED NOT DETECTED   Candida albicans NOT DETECTED NOT DETECTED   Candida auris NOT DETECTED NOT DETECTED   Candida glabrata NOT DETECTED NOT DETECTED   Candida krusei NOT DETECTED NOT DETECTED   Candida parapsilosis NOT DETECTED NOT DETECTED   Candida tropicalis NOT DETECTED NOT DETECTED   Cryptococcus neoformans/gattii NOT DETECTED NOT DETECTED   CTX-M ESBL NOT DETECTED NOT DETECTED   Carbapenem resistance IMP NOT DETECTED NOT DETECTED   Carbapenem resistance KPC NOT DETECTED NOT DETECTED   Carbapenem resistance NDM NOT DETECTED NOT DETECTED   Carbapenem resist OXA 48 LIKE NOT DETECTED NOT DETECTED   Carbapenem resistance VIM NOT DETECTED NOT DETECTED    Napoleon Form 05/03/2020  4:24 PM

## 2020-05-03 NOTE — H&P (Signed)
History and Physical    Jordan Ward YIR:[id number] DOB: June 20, 1941 DOA: 05/02/2020  PCP: [name], MD  Patient coming from: Patient was transferred from [facility].  Chief Complaint: Decreased urine output.  HPI: Jordan Ward is a 79 y.o. male with history of multiple sclerosis with suprapubic catheter which was recently changed 2 weeks ago with history of hypothyroidism sleep apnea recent admission in June for CBD stone and had undergone ERCP with sphincterotomy has been noticing decreasing urine output since yesterday afternoon with some flank pain.  Had chills and rigors.  Denies nausea vomiting.  ED Course: In the ER at [facility] patient was found to have a fever of 101.1 and CT renal study shows acute left-sided hydroureteronephrosis with ureteral stone.  On-call urologist Dr. Alyson Ingles was consulted by the ER physician who requested nephrostomy tube placement urgently in the setting of possible developing sepsis and Dr. Earleen Newport interventional radiology was consulted and patient was transferred to [name].  Patient had nephrostomy tube placed and is being admitted for further management.  Patient had blood cultures drawn empiric antibiotic started.  Labs are remarkable for creatinine of 1.8 which increased from baseline of 1.3 WBC count of 13.2 lactic acid was normal EKG shows normal sinus rhythm.  Review of Systems: As per HPI, rest all negative.   Past Medical History:  Diagnosis Date  . Anemia   . Arthritis   . Back pain, chronic   . Bilateral carotid bruits   . C. difficile colitis 09/2011  . CAD (coronary artery disease)   . Carotid artery stenosis   . Cerebrovascular disease   . Cerebrovascular disease 08/17/2018  . Colon polyps   . Complication of cystostomy catheter, initial encounter ([place]) 04/20/2020  . Dyslipidemia   . Dysphagia 10/07/2011  . Encephalopathy   . Gait disorder   . HA (headache)   . High grade dysplasia in colonic adenoma  09/2005  . History of kidney stones   . HTN (hypertension), malignant 10/06/2011  . Hypernatremia   . Hypokalemia   . Hypothyroidism 10/08/2011  . Insomnia   . Junctional rhythm   . Kidney stones   . MS (multiple sclerosis) ([place])   . Neuromuscular disorder (HCC)    MS  . OSA (obstructive sleep apnea)   . Paroxysmal atrial tachycardia ([place])   . Peripheral vascular disease ([place])   . Pneumonia 4 yrs ago  . Pulmonary fibrosis ([place]) 10/06/2011  . Pulmonary nodule 10/08/2011  . PVD (peripheral vascular disease) ([place])   . Sacral ulcer ([place])   . Sleep apnea    cannot tolerate  . Stroke [facility])    left sided weakness  . Suprapubic catheter ([place])   . TIA (transient ischemic attack)   . Tremors of nervous system 10/08/2011  . Urinary tract infection   . Ventral hernia without obstruction or gangrene     Large 8X9cm ventral hernia with loss of domain. CT reads report as diastasis recti with herniation or diastasis recti.  Dr. Constance Haw, Surgery, reviewed CT with radiology and there is herniation with only hernia sac or peritoneum over the bowel and large separation of the rectus muslce (i.e. diastasis recti aka loss of domain).  No surgical intervention recommended given size, age, and health.     Past Surgical History:  Procedure Laterality Date  . APPENDECTOMY  09/2005   at time of left hemicolectomy  . BACK SURGERY  1976/1979   lower  . BILIARY DILATION  N/A 03/03/2020   Procedure: BILIARY DILATION;  Surgeon: Rogene Houston, MD;  [facility];  Service: Endoscopy;  Laterality: N/A;  . CATARACT EXTRACTION W/PHACO Right 03/08/2018   Procedure: CATARACT EXTRACTION PHACO AND INTRAOCULAR LENS PLACEMENT RIGHT EYE;  Surgeon: Tonny Branch, MD;  Location: [facility] ORS;  Service: Ophthalmology;  Laterality: Right;  CDE: 8.86  . CATARACT EXTRACTION W/PHACO Left 04/05/2018   Procedure: CATARACT EXTRACTION PHACO AND INTRAOCULAR LENS PLACEMENT (IOC);  Surgeon: Tonny Branch, MD;  Location: [facility] ORS;  Service:  Ophthalmology;  Laterality: Left;  CDE: 7.36  . CHOLECYSTECTOMY     Dr. Tamala Julian  . COLON SURGERY  09/2005   [place]: four tubular adenomas, large adenomatous polyp with HIGH GRADE dysplasia  . COLONOSCOPY  11/2004   Dr. Sharol Roussel sessile polyp splenic flexure, 24mm sessile polyp desc colon, tubulovillous adenoma (bx not removed)  . COLONOSCOPY  01/2005   poor prep, polyp could not be found  . COLONOSCOPY  05/2005   with EMR, polypectomy Dr. Olegario Messier, bx showed high grade dysplasia, partially resected  . COLONOSCOPY  09/2005   Dr. Arsenio Loader, [place] ink tattooing, four villous colon polyp (3 had been missed on previous colonoscopies due to limitations of procedures  . COLONOSCOPY  09/2006   normal TI, no polyps  . COLONOSCOPY  10/2007   Dr. Imogene Burn distal mammillations, benign bx, normal TI, random bx neg for microscopic colitis  . CYSTOSCOPY WITH LITHOLAPAXY N/A 07/27/2018   Procedure: CYSTOSCOPY WITH LITHOLAPAXY VIA  SUPRAPUBIC TUBE;  Surgeon: Franchot Gallo, MD;  Location: [facility] OR[facility];  Service: Urology;  Laterality: N/A;  . CYSTOSCOPY WITH RETROGRADE PYELOGRAM, URETEROSCOPY AND STENT PLACEMENT Left 06/09/2017   Procedure: CYSTOSCOPY WITH LEFT RETROGRADE PYELOGRAM, LEFT URETEROSCOPY, LEFT URETEROSCOPIC STONE EXTRACTION, LEFT URETERAL STENT PLACEMENT;  Surgeon: Franchot Gallo, MD;  Location: [facility];  Service: Urology;  Laterality: Left;  . ERCP N/A 03/03/2020   Procedure: ENDOSCOPIC RETROGRADE CHOLANGIOPANCREATOGRAPHY (ERCP);  Surgeon: Rogene Houston, MD;  [facility];  Service: Endoscopy;  Laterality: N/A;  . ERCP N/A 04/20/2020   Procedure: ENDOSCOPIC RETROGRADE CHOLANGIOPANCREATOGRAPHY (ERCP);  Surgeon: Rogene Houston, MD;  [facility];  Service: Endoscopy;  Laterality: N/A;  to be done at 7:30am in OR  . GASTROINTESTINAL STENT REMOVAL N/A 04/20/2020   Procedure: STENT REMOVAL;  Surgeon: Rogene Houston, MD;  [facility];  Service: Endoscopy;   Laterality: N/A;  . INGUINAL HERNIA REPAIR  1971   bilateral  . INSERTION OF SUPRAPUBIC CATHETER  06/09/2017   Procedure: EXCHANGE OF SUPRAPUBIC CATHETER;  Surgeon: Franchot Gallo, MD;  Location: [facility];  Service: Urology;;  . IR NEPHROSTOMY PLACEMENT LEFT  05/26/2017  . KIDNEY STONE SURGERY  09/13/2015  . LITHOTRIPSY N/A 03/03/2020   Procedure: MECHANICAL LITHOTRIPSY WITH REMOVAL OF MULTIPLE STONE FRAGMENTS;  Surgeon: Rogene Houston, MD;  [facility];  Service: Endoscopy;  Laterality: N/A;  . NEPHROLITHOTOMY Left 09/13/2015   Procedure: LEFT PERCUTANEOUS NEPHROLITHOTOMY ;  Surgeon: Franchot Gallo, MD;  Location: [facility];  Service: Urology;  Laterality: Left;  . REMOVAL OF STONES N/A 04/20/2020   Procedure: REMOVAL OF STONES;  Surgeon: Rogene Houston, MD;  [facility];  Service: Endoscopy;  Laterality: N/A;  . SPHINCTEROTOMY  03/03/2020   Procedure: BILLARY SPHINCTEROTOMY;  Surgeon: Rogene Houston, MD;  [facility];  Service: Endoscopy;;  . SUPRAPUBIC CATHETER INSERTION       reports that he quit smoking about 32  years ago. His smoking use included cigarettes. He has a 25.00 pack-year smoking history. He has never used smokeless tobacco. He reports that he does not drink alcohol and does not use drugs.  Allergies  Allergen Reactions  . Tetracyclines & Related Anaphylaxis and Rash  . Ciprofloxacin     Trouble swallowing unknown reaction according to wife     Family History  Problem Relation Age of Onset  . Cirrhosis Brother        etoh  . Stroke Mother 54  . Coronary artery disease Father 52  . Heart attack Brother   . Cancer Sister   . Multiple sclerosis Other   . Colon cancer Neg Hx     Prior to Admission medications   Medication Sig Start Date End Date Taking? Authorizing Provider  amLODipine (NORVASC) 5 MG tablet Take 5 mg by mouth daily.  04/02/20  Yes [provider]  carvedilol (COREG) 6.25 MG tablet Take 6.25 mg by mouth  2 (two) times daily with a meal.   Yes [provider]  diazepam (VALIUM) 5 MG tablet TAKE (1) TABLET BY MOUTH TWICE DAILY. 11/14/19  Yes Kathrynn Ducking, MD  HYDROcodone-acetaminophen (NORCO) 7.5-325 MG per tablet Take 1 tablet by mouth 2 (two) times daily. Max APAP 3 GM IN 24 HOURS FROM ALL SOURCES Patient taking differently: Take 1 tablet by mouth in the morning and at bedtime. ALL MEDICATIONS TO BE CRUSHED AND PLACED IN APPLESAUCE 08/22/14  Yes Pandey, Mahima, MD  levothyroxine (SYNTHROID) 75 MCG tablet Take 75 mcg by mouth daily before breakfast. ALL MEDICATIONS TO BE CRUSHED AND PLACED IN APPLESAUCE 12/21/19  Yes [provider]  mirabegron ER (MYRBETRIQ) 25 MG TB24 tablet Take 25 mg by mouth daily as needed (bladder spasms).   Yes [provider]    Physical Exam: Constitutional: Moderately built and nourished. Vitals:   05/02/20 2353 05/02/20 2355 05/03/20 0001 05/03/20 0015  BP: (!) 171/71 (!) 162/72 (!) 143/68 (!) 143/69  Pulse: 79 83 81 86  Resp: 20 20 15 16   Temp:      TempSrc:      SpO2: 96% 99% 98% 93%  Weight:      Height:       Eyes: Anicteric no pallor. ENMT: No discharge from the ears eyes nose or mouth. Neck: No mass felt.  No neck rigidity. Respiratory: No rhonchi or crepitations. Cardiovascular: S1-S2 heard. Abdomen: Soft nontender bowel sounds present.  Left-sided nephrostomy tube seen. Musculoskeletal: No edema. Skin: No rash. Neurologic: Alert awake oriented to time place and person.  Moves all extremities.  Weak on the lower extremities. Psychiatric: Appears normal.  Normal affect.   Labs on Admission: I have personally reviewed following labs and imaging studies  CBC: Recent Labs  Lab 05/02/20 1718  WBC 13.2*  NEUTROABS 10.9*  HGB 13.3  HCT 42.1  MCV 98.6  PLT 323   Basic Metabolic Panel: Recent Labs  Lab 05/02/20 1718  NA 139  K 3.4*  CL 104  CO2 25  GLUCOSE 162*  BUN 33*  CREATININE 1.89*  CALCIUM 8.9    GFR: Estimated Creatinine Clearance: 23.6 mL/min (A) (by C-G formula based on SCr of 1.89 mg/dL (H)). Liver Function Tests: Recent Labs  Lab 05/02/20 1718  AST 18  ALT 13  ALKPHOS 93  BILITOT 0.6  PROT 7.7  ALBUMIN 4.4   No results for input(s): LIPASE, AMYLASE in the last 168 hours. No results for input(s): AMMONIA in the last  168 hours. Coagulation Profile: Recent Labs  Lab 05/02/20 1718  INR 1.0   Cardiac Enzymes: No results for input(s): CKTOTAL, CKMB, CKMBINDEX, TROPONINI in the last 168 hours. BNP (last 3 results) No results for input(s): PROBNP in the last 8760 hours. HbA1C: No results for input(s): HGBA1C in the last 72 hours. CBG: No results for input(s): GLUCAP in the last 168 hours. Lipid Profile: No results for input(s): CHOL, HDL, LDLCALC, TRIG, CHOLHDL, LDLDIRECT in the last 72 hours. Thyroid Function Tests: No results for input(s): TSH, T4TOTAL, FREET4, T3FREE, THYROIDAB in the last 72 hours. Anemia Panel: No results for input(s): VITAMINB12, FOLATE, FERRITIN, TIBC, IRON, RETICCTPCT in the last 72 hours. Urine analysis:    Component Value Date/Time   COLORURINE AMBER (A) 03/01/2020 1801   APPEARANCEUR CLOUDY (A) 03/01/2020 1801   LABSPEC 1.016 03/01/2020 1801   PHURINE 8.0 03/01/2020 1801   GLUCOSEU NEGATIVE 03/01/2020 1801   HGBUR MODERATE (A) 03/01/2020 1801   BILIRUBINUR NEGATIVE 03/01/2020 1801   KETONESUR NEGATIVE 03/01/2020 1801   PROTEINUR 100 (A) 03/01/2020 1801   UROBILINOGEN 0.2 07/17/2014 1215   NITRITE POSITIVE (A) 03/01/2020 1801   LEUKOCYTESUR MODERATE (A) 03/01/2020 1801   Sepsis Labs: @LABRCNTIP (procalcitonin:4,lacticidven:4) ) Recent Results (from the past 240 hour(s))  Blood culture (routine single)     Status: None (Preliminary result)   Collection Time: 05/02/20  5:18 PM   Specimen: Blood  Result Value Ref Range Status   Specimen Description BLOOD LEFT FOREARM  Final   Special Requests   Final    BOTTLES DRAWN AEROBIC  AND ANAEROBIC Blood Culture adequate volume Performed at [facility], [address], [place], [place] [address]    Culture PENDING  Incomplete   Report Status PENDING  Incomplete  SARS Coronavirus 2 by RT PCR (hospital order, performed in [place] hospital lab) Nasopharyngeal Nasopharyngeal Swab     Status: None   Collection Time: 05/02/20  5:51 PM   Specimen: Nasopharyngeal Swab  Result Value Ref Range Status   SARS Coronavirus 2 NEGATIVE NEGATIVE Final    Comment: (NOTE) SARS-CoV-2 target nucleic acids are NOT DETECTED.  The SARS-CoV-2 RNA is generally detectable in upper and lower respiratory specimens during the acute phase of infection. The lowest concentration of SARS-CoV-2 viral copies this assay can detect is 250 copies / mL. A negative result does not preclude SARS-CoV-2 infection and should not be used as the sole basis for treatment or other patient management decisions.  A negative result may occur with improper specimen collection / handling, submission of specimen other than nasopharyngeal swab, presence of viral mutation(s) within the areas targeted by this assay, and inadequate number of viral copies (<250 copies / mL). A negative result must be combined with clinical observations, patient history, and epidemiological information.  Fact Sheet for Patients:   StrictlyIdeas.no  Fact Sheet for Healthcare Providers: BankingDealers.co.za  This test is not yet approved or  cleared by the [place] FDA and has been authorized for detection and/or diagnosis of SARS-CoV-2 by FDA under an Emergency Use Authorization (EUA).  This EUA will remain in effect (meaning this test can be used) for the duration of the COVID-19 declaration under Section 564(b)(1) of the Act, 21 U.S.C. section 360bbb-3(b)(1), unless the authorization is terminated or revoked sooner.  Performed at [facility], [address], [place], [place]  [address]   Culture, blood (single)     Status: None (Preliminary result)   Collection Time: 05/02/20  6:11 PM   Specimen:  BLOOD RIGHT HAND  Result Value Ref Range Status   Specimen Description BLOOD RIGHT HAND  Final   Special Requests   Final    BOTTLES DRAWN AEROBIC AND ANAEROBIC Blood Culture adequate volume Performed at [facility], [address], [place], [place] [address]    Culture PENDING  Incomplete   Report Status PENDING  Incomplete     Radiological Exams on Admission: DG Chest Port 1 View  Result Date: 05/02/2020 CLINICAL DATA:  Sepsis. EXAM: PORTABLE CHEST 1 VIEW COMPARISON:  Ten thousand eighteen FINDINGS: Chronic bronchitic changes are noted bilaterally. There is no pneumothorax. No pleural effusion. No acute osseous abnormality. There are old healed left-sided rib fractures. The heart size is unchanged. Aortic calcifications are noted. There is a lucency projecting over the partially visualized liver shadow. IMPRESSION: 1. No acute cardiopulmonary process. 2. Lucency projecting over the partially visualized liver shadow. This may represent pneumobilia, perhaps from recent sphincterotomy. Correlation with a dedicated abdominal radiograph is recommended. Electronically Signed   By: Constance Holster M.D.   On: 05/02/2020 17:35   CT Renal Stone Study  Result Date: 05/02/2020 CLINICAL DATA:  Flank pain. Chronic suprapubic catheter replaced last week. EXAM: CT ABDOMEN AND PELVIS WITHOUT CONTRAST TECHNIQUE: Multidetector CT imaging of the abdomen and pelvis was performed following the standard protocol without IV contrast. COMPARISON:  CT dated March 01, 2020 FINDINGS: Lower chest: There are chronic changes at the lung bases bilaterally with areas of subpleural reticulation.The heart size is normal. Hepatobiliary: The liver is normal. Status post cholecystectomy.There is pneumobilia primarily within the left hepatic lobe. This corresponds well with the finding seen on the patient's recent  chest x-ray and is likely related to prior sphincterotomy. Pancreas: Normal contours without ductal dilatation. No peripancreatic fluid collection. Spleen: Unremarkable. Adrenals/Urinary Tract: --Adrenal glands: Unremarkable. --Right kidney/ureter: The right kidney is atrophic without evidence for hydronephrosis. There are nonobstructing stones in the lower pole the right kidney. --Left kidney/ureter: There is new severe left-sided hydroureteronephrosis secondary to an obstructing stone in the distal left ureter measuring approximately 8 mm (axial series 2, image 57). There may be an additional stone fragment at the left UVJ measuring approximately 5 mm (axial series 2, image 70). There are residual stones in the left kidney measuring up to approximately 1 cm in the lower pole. --Urinary bladder: The bladder is decompressed with a suprapubic catheter. The catheter appears to be grossly well position. There is hyperdense material within the collapsed urinary bladder which may represent contrast or stone material/gravel. Stomach/Bowel: --Stomach/Duodenum: No hiatal hernia or other gastric abnormality. Normal duodenal course and caliber. --Small bowel: Unremarkable. --Colon: The patient is status post prior left hemicolectomy. There is a surgical anastomosis in the patient's pelvis that is patent. --Appendix: Surgically absent. Vascular/Lymphatic: Atherosclerotic calcification is present within the non-aneurysmal abdominal aorta, without hemodynamically significant stenosis. 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 --No retroperitoneal lymphadenopathy. --No mesenteric lymphadenopathy. --No pelvic or inguinal lymphadenopathy. Reproductive: Unremarkable Other: Again noted is significant diastasis of the low anterior abdominal [place]. Musculoskeletal. 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 IMPRESSION: 1. New severe left-sided hydroureteronephrosis  secondary to an obstructing stone in the distal left ureter measuring approximately 8 mm. There may be an additional stone fragment at the left UVJ measuring approximately 5 mm. 2. Bilateral nonobstructing nephrolithiasis.  Atrophic right kidney. 3. The bladder is decompressed with a suprapubic catheter. There is hyperdense material within the collapsed urinary bladder which  may represent contrast or stone material/gravel. 4. Pneumobilia, likely related to interval sphincterotomy. This explains the finding seen on the patient's recent chest x-ray. 5. Avascular necrosis of the right femoral head. 6. 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 7. Additional chronic findings as detailed above. Aortic Atherosclerosis (ICD10-I70.0). Electronically Signed   By: Constance Holster M.D.   On: 05/02/2020 19:28    EKG: Independently reviewed.  Normal sinus rhythm.  Assessment/Plan Principal Problem:   Hydronephrosis due to obstruction of ureter Active Problems:   Multiple sclerosis (HCC)   Chronic suprapubic catheter (HCC)   Pulmonary fibrosis (HCC)   Tremors of nervous system   Hypothyroidism   CKD (chronic kidney disease), stage III   Acute kidney injury superimposed on CKD (HCC)   SIRS (systemic inflammatory response syndrome) ([place])    1. Acute left-sided hydroureteronephrosis with a ureteral stone in the setting of possible lowering sepsis from UTI patient underwent left-sided nephrostomy tube placement by Dr. Earleen Newport.  Further commendations per urology. 2. SIRS concerning for developing sepsis from UTI.  UA is pending.  Follow cultures.  Continue antibiotics for now. 3. Recent ERCP for CBD stone. 4. Hypothyroidism on Synthroid. 5. Sleep apnea on CPAP. 6. Acute on chronic kidney disease with single functioning kidney likely from obstructive uropathy.  Gently hydrate now that patient has nephrostomy.  Closely follow intake  output. 7. History of multiple sclerosis. 8. Chronic pain. 9. History of tremors. 10. History of pulmonary fibrosis per chart. 11. Chronic suprapubic catheter placement for neurogenic bladder.  Since patient has SIRS picture with renal failure will need inpatient status.   DVT prophylaxis: SCDs for now.  If patient continues to remain stable change to Lovenox in the morning.  Patient just had a procedure. Code Status: Full code. Family Communication: Discussed with patient. Disposition Plan: Home. Consults called: Neurologist and interventional radiologist. Admission status: Inpatient.   Rise Patience MD [facility] Pager [phone].  If 7PM-7AM, please contact night-coverage [email or web address] Password [email or web address]  05/03/2020, 12:33 AM

## 2020-05-03 NOTE — Consult Note (Signed)
Urology Consult   Physician requesting consult: Dr. Grandville Silos  Reason for consult: Left ureteral stones with   History of Present Illness: Jordan Ward is a 79 y.o. patient followed by Dr. [name] in [place].  He has an atrophic right kidney with a mostly solitary functional left kidney and history of kidney stones.  He also has a neurogenic bladder and has a chronic SP tube for management.  He presented to the [facility] ED last night with fever and a CT scan revealed left distal ureteral stones.  Dr. Alyson Ingles was consulted and recommended transfer to [facility] for left PCN placement.  This was done by Dr. Earleen Newport last night.  He is currently hemodynamically stable.    Past Medical History:  Diagnosis Date  . Anemia   . Arthritis   . Back pain, chronic   . Bilateral carotid bruits   . C. difficile colitis 09/2011  . CAD (coronary artery disease)   . Carotid artery stenosis   . Cerebrovascular disease   . Cerebrovascular disease 08/17/2018  . Colon polyps   . Complication of cystostomy catheter, initial encounter ([place]) 04/20/2020  . Dyslipidemia   . Dysphagia 10/07/2011  . Encephalopathy   . Gait disorder   . HA (headache)   . High grade dysplasia in colonic adenoma 09/2005  . History of kidney stones   . HTN (hypertension), malignant 10/06/2011  . Hypernatremia   . Hypokalemia   . Hypothyroidism 10/08/2011  . Insomnia   . Junctional rhythm   . Kidney stones   . MS (multiple sclerosis) ([place])   . Neuromuscular disorder (HCC)    MS  . OSA (obstructive sleep apnea)   . Paroxysmal atrial tachycardia ([place])   . Peripheral vascular disease ([place])   . Pneumonia 4 yrs ago  . Pulmonary fibrosis ([place]) 10/06/2011  . Pulmonary nodule 10/08/2011  . PVD (peripheral vascular disease) ([place])   . Sacral ulcer ([place])   . Sleep apnea    cannot tolerate  . Stroke [facility])    left sided weakness  . Suprapubic catheter ([place])   . TIA (transient ischemic attack)   . Tremors of nervous system 10/08/2011  .  Urinary tract infection   . Ventral hernia without obstruction or gangrene     Large 8X9cm ventral hernia with loss of domain. CT reads report as diastasis recti with herniation or diastasis recti.  Dr. Constance Haw, Surgery, reviewed CT with radiology and there is herniation with only hernia sac or peritoneum over the bowel and large separation of the rectus muslce (i.e. diastasis recti aka loss of domain).  No surgical intervention recommended given size, age, and health.     Past Surgical History:  Procedure Laterality Date  . APPENDECTOMY  09/2005   at time of left hemicolectomy  . BACK SURGERY  1976/1979   lower  . BILIARY DILATION N/A 03/03/2020   Procedure: BILIARY DILATION;  Surgeon: Rogene Houston, MD;  Location: [facility] END[facility] SU[facility];  Service: Endoscopy;  Laterality: N/A;  . CATARACT EXTRACTION W/PHACO Right 03/08/2018   Procedure: CATARACT EXTRACTION PHACO AND INTRAOCULAR LENS PLACEMENT RIGHT EYE;  Surgeon: Tonny Branch, MD;  Location: [facility] ORS;  Service: Ophthalmology;  Laterality: Right;  CDE: 8.86  . CATARACT EXTRACTION W/PHACO Left 04/05/2018   Procedure: CATARACT EXTRACTION PHACO AND INTRAOCULAR LENS PLACEMENT (IOC);  Surgeon: Tonny Branch, MD;  Location: [facility] ORS;  Service: Ophthalmology;  Laterality: Left;  CDE: 7.36  . CHOLECYSTECTOMY     Dr. Tamala Julian  . COLON SURGERY  09/2005   [place]: four tubular adenomas, large adenomatous polyp with HIGH GRADE dysplasia  . COLONOSCOPY  11/2004   Dr. Sharol Roussel sessile polyp splenic flexure, 7mm sessile polyp desc colon, tubulovillous adenoma (bx not removed)  . COLONOSCOPY  01/2005   poor prep, polyp could not be found  . COLONOSCOPY  05/2005   with EMR, polypectomy Dr. Olegario Messier, bx showed high grade dysplasia, partially resected  . COLONOSCOPY  09/2005   Dr. Arsenio Loader, [place] ink tattooing, four villous colon polyp (3 had been missed on previous colonoscopies due to limitations of procedures  . COLONOSCOPY  09/2006   normal TI, no polyps  .  COLONOSCOPY  10/2007   Dr. Imogene Burn distal mammillations, benign bx, normal TI, random bx neg for microscopic colitis  . CYSTOSCOPY WITH LITHOLAPAXY N/A 07/27/2018   Procedure: CYSTOSCOPY WITH LITHOLAPAXY VIA  SUPRAPUBIC TUBE;  Surgeon: Franchot Gallo, MD;  Location: [facility] ORS;  Service: Urology;  Laterality: N/A;  . CYSTOSCOPY WITH RETROGRADE PYELOGRAM, URETEROSCOPY AND STENT PLACEMENT Left 06/09/2017   Procedure: CYSTOSCOPY WITH LEFT RETROGRADE PYELOGRAM, LEFT URETEROSCOPY, LEFT URETEROSCOPIC STONE EXTRACTION, LEFT URETERAL STENT PLACEMENT;  Surgeon: Franchot Gallo, MD;  Location: [facility] ORS;  Service: Urology;  Laterality: Left;  . ERCP N/A 03/03/2020   Procedure: ENDOSCOPIC RETROGRADE CHOLANGIOPANCREATOGRAPHY (ERCP);  Surgeon: Rogene Houston, MD;  Location: [facility];  Service: Endoscopy;  Laterality: N/A;  . ERCP N/A 04/20/2020   Procedure: ENDOSCOPIC RETROGRADE CHOLANGIOPANCREATOGRAPHY (ERCP);  Surgeon: Rogene Houston, MD;  Location: [facility];  Service: Endoscopy;  Laterality: N/A;  to be done at 7:30am in OR  . GASTROINTESTINAL STENT REMOVAL N/A 04/20/2020   Procedure: STENT REMOVAL;  Surgeon: Rogene Houston, MD;  Location: [facility];  Service: Endoscopy;  Laterality: N/A;  . INGUINAL HERNIA REPAIR  1971   bilateral  . INSERTION OF SUPRAPUBIC CATHETER  06/09/2017   Procedure: EXCHANGE OF SUPRAPUBIC CATHETER;  Surgeon: Franchot Gallo, MD;  Location: [facility] ORS;  Service: Urology;;  . IR NEPHROSTOMY PLACEMENT LEFT  05/26/2017  . IR NEPHROSTOMY PLACEMENT LEFT  05/02/2020  . KIDNEY STONE SURGERY  09/13/2015  . LITHOTRIPSY N/A 03/03/2020   Procedure: MECHANICAL LITHOTRIPSY WITH REMOVAL OF MULTIPLE STONE FRAGMENTS;  Surgeon: Rogene Houston, MD;  Location: [facility];  Service: Endoscopy;  Laterality: N/A;  . NEPHROLITHOTOMY Left 09/13/2015   Procedure: LEFT PERCUTANEOUS NEPHROLITHOTOMY ;  Surgeon: Franchot Gallo, MD;  Location: [facility] ORS;  Service: Urology;  Laterality:  Left;  . REMOVAL OF STONES N/A 04/20/2020   Procedure: REMOVAL OF STONES;  Surgeon: Rogene Houston, MD;  Location: [facility];  Service: Endoscopy;  Laterality: N/A;  . SPHINCTEROTOMY  03/03/2020   Procedure: BILLARY SPHINCTEROTOMY;  Surgeon: Rogene Houston, MD;  Location: [facility];  Service: Endoscopy;;  . SUPRAPUBIC CATHETER INSERTION      Medications:  Home meds:  No current facility-administered medications on file prior to encounter.   Current Outpatient Medications on File Prior to Encounter  Medication Sig Dispense Refill  . amLODipine (NORVASC) 5 MG tablet Take 5 mg by mouth daily.     . carvedilol (COREG) 6.25 MG tablet Take 6.25 mg by mouth 2 (two) times daily with a meal.    . diazepam (VALIUM) 5 MG tablet TAKE (1) TABLET BY MOUTH TWICE DAILY. 60 tablet 5  . HYDROcodone-acetaminophen (NORCO) 7.5-325 MG per tablet Take 1 tablet by mouth 2 (two) times daily. Max APAP 3 GM IN 24 HOURS FROM ALL SOURCES (Patient  taking differently: Take 1 tablet by mouth in the morning and at bedtime. ALL MEDICATIONS TO BE CRUSHED AND PLACED IN APPLESAUCE) 60 tablet 0  . levothyroxine (SYNTHROID) 75 MCG tablet Take 75 mcg by mouth daily before breakfast. ALL MEDICATIONS TO BE CRUSHED AND PLACED IN APPLESAUCE    . mirabegron ER (MYRBETRIQ) 25 MG TB24 tablet Take 25 mg by mouth daily as needed (bladder spasms).       Scheduled Meds: . amLODipine  5 mg Oral Daily  . carvedilol  6.25 mg Oral BID WC  . fentaNYL      . levothyroxine  75 mcg Oral Q0600  . lidocaine      . midazolam      . sodium polystyrene  45 g Oral Once   Continuous Infusions: . sodium chloride    . ceFEPime (MAXIPIME) IV Stopped (05/02/20 1931)  . vancomycin     PRN Meds:.acetaminophen **OR** acetaminophen, diazepam, HYDROcodone-acetaminophen, mirabegron ER, ondansetron **OR** ondansetron (ZOFRAN) IV  Allergies:  Allergies  Allergen Reactions  . Tetracyclines & Related Anaphylaxis and Rash  . Ciprofloxacin      Trouble swallowing unknown reaction according to wife     Family History  Problem Relation Age of Onset  . Cirrhosis Brother        etoh  . Stroke Mother 73  . Coronary artery disease Father 12  . Heart attack Brother   . Cancer Sister   . Multiple sclerosis Other   . Colon cancer Neg Hx     Social History:  reports that he quit smoking about 32 years ago. His smoking use included cigarettes. He has a 25.00 pack-year smoking history. He has never used smokeless tobacco. He reports that he does not drink alcohol and does not use drugs.  ROS: A complete review of systems was performed.  All systems are negative except for pertinent findings as noted.  Physical Exam:  Vital signs in last 24 hours: Temp:  [98.4 F (36.9 C)-101.1 F (38.4 C)] 99.2 F (37.3 C) (08/05 0732) Pulse Rate:  [63-86] 66 (08/05 0801) Resp:  [11-29] 18 (08/05 0801) BP: (109-186)/(50-102) 137/68 (08/05 0801) SpO2:  [90 %-99 %] 94 % (08/05 0801) Weight:  [51 kg] 51 kg (08/04 1644) Constitutional:  Alert and oriented, No acute distress Cardiovascular: No JVD Respiratory: Normal respiratory effort GI: Abdomen is soft, nontender, nondistended, no abdominal masses.  SP tube in place. Genitourinary: No CVAT. L PCN in place with grossly clear urine draining.   Lymphatic: No lymphadenopathy Neurologic: Grossly intact, no focal deficits Psychiatric: Normal mood and affect  Laboratory Data:  Recent Labs    05/02/20 1718 05/03/20 0500  WBC 13.2* 18.8*  HGB 13.3 12.8*  HCT 42.1 39.7  PLT 157 160    Recent Labs    05/02/20 1718 05/03/20 0500  NA 139 139  K 3.4* 6.1*  CL 104 108  GLUCOSE 162* 127*  BUN 33* 30*  CALCIUM 8.9 8.1*  CREATININE 1.89* 2.61*     Results for orders placed or performed during the hospital encounter of 05/02/20 (from the past 24 hour(s))  Lactic acid, plasma     Status: None   Collection Time: 05/02/20  5:18 PM  Result Value Ref Range   Lactic Acid, Venous 1.7 0.5 - 1.9  mmol/L  Comprehensive metabolic panel     Status: Abnormal   Collection Time: 05/02/20  5:18 PM  Result Value Ref Range   Sodium 139 135 - 145 mmol/L   Potassium  3.4 (L) 3.5 - 5.1 mmol/L   Chloride 104 98 - 111 mmol/L   CO2 25 22 - 32 mmol/L   Glucose, Bld 162 (H) 70 - 99 mg/dL   BUN 33 (H) 8 - 23 mg/dL   Creatinine, Ser 1.89 (H) 0.61 - 1.24 mg/dL   Calcium 8.9 8.9 - 10.3 mg/dL   Total Protein 7.7 6.5 - 8.1 g/dL   Albumin 4.4 3.5 - 5.0 g/dL   AST 18 15 - 41 U/L   ALT 13 0 - 44 U/L   Alkaline Phosphatase 93 38 - 126 U/L   Total Bilirubin 0.6 0.3 - 1.2 mg/dL   GFR calc non Af Amer 33 (L) >60 mL/min   GFR calc Af Amer 39 (L) >60 mL/min   Anion gap 10 5 - 15  CBC WITH DIFFERENTIAL     Status: Abnormal   Collection Time: 05/02/20  5:18 PM  Result Value Ref Range   WBC 13.2 (H) 4.0 - 10.5 K/uL   RBC 4.27 4.22 - 5.81 MIL/uL   Hemoglobin 13.3 13.0 - 17.0 g/dL   HCT 42.1 39 - 52 %   MCV 98.6 80.0 - 100.0 fL   MCH 31.1 26.0 - 34.0 pg   MCHC 31.6 30.0 - 36.0 g/dL   RDW 13.6 11.5 - 15.5 %   Platelets 157 150 - 400 K/uL   nRBC 0.0 0.0 - 0.2 %   Neutrophils Relative % 83 %   Neutro Abs 10.9 (H) 1.7 - 7.7 K/uL   Lymphocytes Relative 9 %   Lymphs Abs 1.2 0.7 - 4.0 K/uL   Monocytes Relative 5 %   Monocytes Absolute 0.7 0 - 1 K/uL   Eosinophils Relative 2 %   Eosinophils Absolute 0.3 0 - 0 K/uL   Basophils Relative 1 %   Basophils Absolute 0.1 0 - 0 K/uL   Immature Granulocytes 0 %   Abs Immature Granulocytes 0.05 0.00 - 0.07 K/uL  Protime-INR     Status: None   Collection Time: 05/02/20  5:18 PM  Result Value Ref Range   Prothrombin Time 12.7 11.4 - 15.2 seconds   INR 1.0 0.8 - 1.2  APTT     Status: None   Collection Time: 05/02/20  5:18 PM  Result Value Ref Range   aPTT 28 24 - 36 seconds  Blood culture (routine single)     Status: None (Preliminary result)   Collection Time: 05/02/20  5:18 PM   Specimen: BLOOD LEFT FOREARM  Result Value Ref Range   Specimen Description  BLOOD LEFT FOREARM    Special Requests      BOTTLES DRAWN AEROBIC AND ANAEROBIC Blood Culture adequate volume   Culture      NO GROWTH < 12 HOURS Performed at [facility], [address], [place], [place] [address]    Report Status PENDING   SARS Coronavirus 2 by RT PCR (hospital order, performed in [place] hospital lab) Nasopharyngeal Nasopharyngeal Swab     Status: None   Collection Time: 05/02/20  5:51 PM   Specimen: Nasopharyngeal Swab  Result Value Ref Range   SARS Coronavirus 2 NEGATIVE NEGATIVE  Culture, blood (single)     Status: None (Preliminary result)   Collection Time: 05/02/20  6:11 PM   Specimen: BLOOD RIGHT HAND  Result Value Ref Range   Specimen Description BLOOD RIGHT HAND    Special Requests      BOTTLES DRAWN AEROBIC AND ANAEROBIC Blood Culture adequate volume  Culture  Setup Time      GRAM NEGATIVE RODS ANAEROBIC BOTTLE ONLY Gram Stain Report Called to,Read Back By and Verified With: GARRISON G. AT 0827A ON [id number] BY THOMPSON S. Performed at [facility], [address], [place], [place] [address]    Culture PENDING    Report Status PENDING   Lactic acid, plasma     Status: None   Collection Time: 05/02/20  7:21 PM  Result Value Ref Range   Lactic Acid, Venous 1.8 0.5 - 1.9 mmol/L  Aerobic/Anaerobic Culture (surgical/deep wound)     Status: None (Preliminary result)   Collection Time: 05/03/20 12:08 AM   Specimen: Kidney; Urine  Result Value Ref Range   Specimen Description      KIDNEY Performed at [facility] [address], [place], [place] [address]    Special Requests      NONE Performed at [facility], [place] [address], [place], [place] [address]    Gram Stain      ABUNDANT WBC PRESENT, PREDOMINANTLY PMN RARE GRAM NEGATIVE RODS Performed at [facility], [place] [address], [place],  [address]    Culture PENDING    Report Status PENDING   Lactic acid, plasma     Status: None   Collection  Time: 05/03/20 12:34 AM  Result Value Ref Range   Lactic Acid, Venous 1.2 0.5 - 1.9 mmol/L  Comprehensive metabolic panel     Status: Abnormal   Collection Time: 05/03/20  5:00 AM  Result Value Ref Range   Sodium 139 135 - 145 mmol/L   Potassium 6.1 (H) 3.5 - 5.1 mmol/L   Chloride 108 98 - 111 mmol/L   CO2 24 22 - 32 mmol/L   Glucose, Bld 127 (H) 70 - 99 mg/dL   BUN 30 (H) 8 - 23 mg/dL   Creatinine, Ser 2.61 (H) 0.61 - 1.24 mg/dL   Calcium 8.1 (L) 8.9 - 10.3 mg/dL   Total Protein 5.6 (L) 6.5 - 8.1 g/dL   Albumin 3.4 (L) 3.5 - 5.0 g/dL   AST 54 (H) 15 - 41 U/L   ALT 20 0 - 44 U/L   Alkaline Phosphatase 70 38 - 126 U/L   Total Bilirubin 2.4 (H) 0.3 - 1.2 mg/dL   GFR calc non Af Amer 23 (L) >60 mL/min   GFR calc Af Amer 26 (L) >60 mL/min   Anion gap 7 5 - 15  CBC WITH DIFFERENTIAL     Status: Abnormal   Collection Time: 05/03/20  5:00 AM  Result Value Ref Range   WBC 18.8 (H) 4.0 - 10.5 K/uL   RBC 4.04 (L) 4.22 - 5.81 MIL/uL   Hemoglobin 12.8 (L) 13.0 - 17.0 g/dL   HCT 39.7 39 - 52 %   MCV 98.3 80.0 - 100.0 fL   MCH 31.7 26.0 - 34.0 pg   MCHC 32.2 30.0 - 36.0 g/dL   RDW 13.9 11.5 - 15.5 %   Platelets 160 150 - 400 K/uL   nRBC 0.0 0.0 - 0.2 %   Neutrophils Relative % 84 %   Neutro Abs 16.1 (H) 1.7 - 7.7 K/uL   Lymphocytes Relative 8 %   Lymphs Abs 1.5 0.7 - 4.0 K/uL   Monocytes Relative 6 %   Monocytes Absolute 1.0 0 - 1 K/uL   Eosinophils Relative 1 %   Eosinophils Absolute 0.1 0 - 0 K/uL   Basophils Relative 0 %   Basophils Absolute  0.1 0 - 0 K/uL   Immature Granulocytes 1 %   Abs Immature Granulocytes 0.13 (H) 0.00 - 0.07 K/uL  Lactic acid, plasma     Status: None   Collection Time: 05/03/20  5:00 AM  Result Value Ref Range   Lactic Acid, Venous 1.3 0.5 - 1.9 mmol/L   Recent Results (from the past 240 hour(s))  Blood culture (routine single)     Status: None (Preliminary result)   Collection Time: 05/02/20  5:18 PM   Specimen: BLOOD LEFT FOREARM  Result Value  Ref Range Status   Specimen Description BLOOD LEFT FOREARM  Final   Special Requests   Final    BOTTLES DRAWN AEROBIC AND ANAEROBIC Blood Culture adequate volume   Culture   Final    NO GROWTH < 12 HOURS Performed at [facility], [address] [place], [place] [address]    Report Status PENDING  Incomplete  SARS Coronavirus 2 by RT PCR (hospital order, performed in [facility] hospital lab) Nasopharyngeal Nasopharyngeal Swab     Status: None   Collection Time: 05/02/20  5:51 PM   Specimen: Nasopharyngeal Swab  Result Value Ref Range Status   SARS Coronavirus 2 NEGATIVE NEGATIVE Final    Comment: (NOTE) SARS-CoV-2 target nucleic acids are NOT DETECTED.  The SARS-CoV-2 RNA is generally detectable in upper and lower respiratory specimens during the acute phase of infection. The lowest concentration of SARS-CoV-2 viral copies this assay can detect is 250 copies / mL. A negative result does not preclude SARS-CoV-2 infection and should not be used as the sole basis for treatment or other patient management decisions.  A negative result may occur with improper specimen collection / handling, submission of specimen other than nasopharyngeal swab, presence of viral mutation(s) within the areas targeted by this assay, and inadequate number of viral copies (<250 copies / mL). A negative result must be combined with clinical observations, patient history, and epidemiological information.  Fact Sheet for Patients:   StrictlyIdeas.no  Fact Sheet for Healthcare Providers: BankingDealers.co.za  This test is not yet approved or  cleared by the [place] FDA and has been authorized for detection and/or diagnosis of SARS-CoV-2 by FDA under an Emergency Use Authorization (EUA).  This EUA will remain in effect (meaning this test can be used) for the duration of the COVID-19 declaration under Section 564(b)(1) of the Act, 21 U.S.C. section  360bbb-3(b)(1), unless the authorization is terminated or revoked sooner.  Performed at [facility], [address] [place], [place] [address]   Culture, blood (single)     Status: None (Preliminary result)   Collection Time: 05/02/20  6:11 PM   Specimen: BLOOD RIGHT HAND  Result Value Ref Range Status   Specimen Description BLOOD RIGHT HAND  Final   Special Requests   Final    BOTTLES DRAWN AEROBIC AND ANAEROBIC Blood Culture adequate volume   Culture  Setup Time   Final    GRAM NEGATIVE RODS ANAEROBIC BOTTLE ONLY Gram Stain Report Called to,Read Back By and Verified With: GARRISON G. AT 0827A ON [id number] BY THOMPSON S. Performed at [facility], [address], [place], [place] [address]    Culture PENDING  Incomplete   Report Status PENDING  Incomplete  Aerobic/Anaerobic Culture (surgical/deep wound)     Status: None (Preliminary result)   Collection Time: 05/03/20 12:08 AM   Specimen: Kidney; Urine  Result Value Ref Range Status   Specimen Description   Final    KIDNEY Performed  at [facility], [place] [address], [place], [place] [address]    Special Requests   Final    NONE Performed at [facility], [place] [address] [place], [place] [address]    Gram Stain   Final    ABUNDANT WBC PRESENT, PREDOMINANTLY PMN RARE GRAM NEGATIVE RODS Performed at [facility], [place] [address] [place], [place] [address]    Culture PENDING  Incomplete   Report Status PENDING  Incomplete    Renal Function: Recent Labs    05/02/20 1718 05/03/20 0500  CREATININE 1.89* 2.61*   Estimated Creatinine Clearance: 17.1 mL/min (A) (by C-G formula based on SCr of 2.61 mg/dL (H)).  Radiologic Imaging: DG Chest Port 1 View  Result Date: 05/02/2020 CLINICAL DATA:  Sepsis. EXAM: PORTABLE CHEST 1 VIEW COMPARISON:  Ten thousand eighteen FINDINGS: Chronic bronchitic changes are noted bilaterally. There is no pneumothorax. No pleural effusion. No acute osseous  abnormality. There are old healed left-sided rib fractures. The heart size is unchanged. Aortic calcifications are noted. There is a lucency projecting over the partially visualized liver shadow. IMPRESSION: 1. No acute cardiopulmonary process. 2. Lucency projecting over the partially visualized liver shadow. This may represent pneumobilia, perhaps from recent sphincterotomy. Correlation with a dedicated abdominal radiograph is recommended. Electronically Signed   By: Constance Holster M.D.   On: 05/02/2020 17:35   CT Renal Stone Study  Result Date: 05/02/2020 CLINICAL DATA:  Flank pain. Chronic suprapubic catheter replaced last week. EXAM: CT ABDOMEN AND PELVIS WITHOUT CONTRAST TECHNIQUE: Multidetector CT imaging of the abdomen and pelvis was performed following the standard protocol without IV contrast. COMPARISON:  CT dated March 01, 2020 FINDINGS: Lower chest: There are chronic changes at the lung bases bilaterally with areas of subpleural reticulation.The heart size is normal. Hepatobiliary: The liver is normal. Status post cholecystectomy.There is pneumobilia primarily within the left hepatic lobe. This corresponds well with the finding seen on the patient's recent chest x-ray and is likely related to prior sphincterotomy. Pancreas: Normal contours without ductal dilatation. No peripancreatic fluid collection. Spleen: Unremarkable. Adrenals/Urinary Tract: --Adrenal glands: Unremarkable. --Right kidney/ureter: The right kidney is atrophic without evidence for hydronephrosis. There are nonobstructing stones in the lower pole the right kidney. --Left kidney/ureter: There is new severe left-sided hydroureteronephrosis secondary to an obstructing stone in the distal left ureter measuring approximately 8 mm (axial series 2, image 57). There may be an additional stone fragment at the left UVJ measuring approximately 5 mm (axial series 2, image 70). There are residual stones in the left kidney measuring up to  approximately 1 cm in the lower pole. --Urinary bladder: The bladder is decompressed with a suprapubic catheter. The catheter appears to be grossly well position. There is hyperdense material within the collapsed urinary bladder which may represent contrast or stone material/gravel. Stomach/Bowel: --Stomach/Duodenum: No hiatal hernia or other gastric abnormality. Normal duodenal course and caliber. --Small bowel: Unremarkable. --Colon: The patient is status post prior left hemicolectomy. There is a surgical anastomosis in the patient's pelvis that is patent. --Appendix: Surgically absent. Vascular/Lymphatic: Atherosclerotic calcification is present within the non-aneurysmal abdominal aorta, without hemodynamically significant stenosis. 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 --No retroperitoneal lymphadenopathy. --No mesenteric lymphadenopathy. --No pelvic or inguinal lymphadenopathy. Reproductive: Unremarkable Other: Again noted is significant diastasis of the low anterior abdominal [place]. Musculoskeletal. 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 IMPRESSION: 1. New severe left-sided hydroureteronephrosis secondary to an  obstructing stone in the distal left ureter measuring approximately 8 mm. There may be an additional stone fragment at the left UVJ measuring approximately 5 mm. 2. Bilateral nonobstructing nephrolithiasis.  Atrophic right kidney. 3. The bladder is decompressed with a suprapubic catheter. There is hyperdense material within the collapsed urinary bladder which may represent contrast or stone material/gravel. 4. Pneumobilia, likely related to interval sphincterotomy. This explains the finding seen on the patient's recent chest x-ray. 5. Avascular necrosis of the right femoral head. 6. 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 7. Additional chronic findings as  detailed above. Aortic Atherosclerosis (ICD10-I70.0). Electronically Signed   By: Constance Holster M.D.   On: 05/02/2020 19:28   IR NEPHROSTOMY PLACEMENT LEFT  Result Date: 05/03/2020 INDICATION: 79 year old male with a history of solitary left kidney and hydronephrosis secondary to obstructive left ureteral stone EXAM: IR NEPHROSTOMY PLACEMENT LEFT COMPARISON:  None. MEDICATIONS: None ANESTHESIA/SEDATION: Fentanyl 1.0 mcg IV; Versed 50 mg IV Moderate Sedation Time:  10 minutes The patient was continuously monitored during the procedure by the interventional radiology nurse under my direct supervision. CONTRAST:  63mL OMNIPAQUE IOHEXOL 300 MG/ML SOLN - administered into the collecting system(s) FLUOROSCOPY TIME:  Fluoroscopy Time: 0 minutes 24 seconds (3 mGy). COMPLICATIONS: None PROCEDURE: Informed written consent was obtained from the patient after a thorough discussion of the procedural risks, benefits and alternatives. All questions were addressed. Maximal Sterile Barrier Technique was utilized including caps, mask, sterile gowns, sterile gloves, sterile drape, hand hygiene and skin antiseptic. A timeout was performed prior to the initiation of the procedure. Patient positioned prone position on the fluoroscopy table. Ultrasound survey of the left flank was performed with images stored and sent to PACs. The patient was then prepped and draped in the usual sterile fashion. 1% lidocaine was used to anesthetize the skin and subcutaneous tissues for local anesthesia. A Chiba needle was then used to access a posterior inferior calyx with ultrasound guidance. With spontaneous urine returned through the needle, passage of an 018 micro wire into the collecting system was performed under fluoroscopy. A small incision was made with an 11 blade scalpel, and the needle was removed from the wire. An Envy system was then advanced over the wire into the collecting system under fluoroscopy. The metal stiffener and inner  dilator were removed, and then a sample of fluid was aspirated through the 4 French outer sheath. Bentson wire was passed into the collecting system and the sheath removed. Ten French dilation of the soft tissues was performed. Using modified Seldinger technique, a 10 French pigtail catheter drain was placed over the Bentson wire. Wire and inner stiffener removed, and the pigtail was formed in the collecting system. Small amount of contrast confirmed position of the catheter. Patient tolerated the procedure well and remained hemodynamically stable throughout. No complications were encountered and no significant blood loss encountered IMPRESSION: Status post image guided left-sided percutaneous nephrostomy. Signed, Dulcy Fanny. Dellia Nims, RPVI [facility] Radiology Electronically Signed   By: Corrie Mckusick D.O.   On: 05/03/2020 08:56    I independently reviewed the above imaging studies.  Impression/Recommendation 1) Left ureteral calculi with sepsis: Continue broad spectrum antibiotics pending cultures.  Ok to discharge on culture specific antibiotics once culture is final.  Will need 10-14 days of appropriate antibiotic therapy.  Will then arrange outpatient follow up for definitive stone treatment.  Dr. [name] notified of patient's admission and will follow  during his hospitalization and to help arrange f/u.  2) Neurogenic bladder/AKI: Continue SP tube.  Decreased UOP expected from SP tube considering a mostly functional solitary left kidney with PCN now in place.  Monitor renal function.  Dutch Gray 05/03/2020, 9:10 AM    Pryor Curia MD  CC: Dr. Irine Seal

## 2020-05-04 DIAGNOSIS — J8410 Pulmonary fibrosis, unspecified: Secondary | ICD-10-CM

## 2020-05-04 DIAGNOSIS — G35 Multiple sclerosis: Secondary | ICD-10-CM

## 2020-05-04 DIAGNOSIS — A419 Sepsis, unspecified organism: Secondary | ICD-10-CM

## 2020-05-04 DIAGNOSIS — N390 Urinary tract infection, site not specified: Secondary | ICD-10-CM | POA: Diagnosis present

## 2020-05-04 DIAGNOSIS — N139 Obstructive and reflux uropathy, unspecified: Secondary | ICD-10-CM

## 2020-05-04 DIAGNOSIS — N1330 Unspecified hydronephrosis: Secondary | ICD-10-CM

## 2020-05-04 DIAGNOSIS — N1831 Chronic kidney disease, stage 3a: Secondary | ICD-10-CM

## 2020-05-04 DIAGNOSIS — R7881 Bacteremia: Secondary | ICD-10-CM | POA: Diagnosis present

## 2020-05-04 DIAGNOSIS — E860 Dehydration: Secondary | ICD-10-CM

## 2020-05-04 LAB — BASIC METABOLIC PANEL
Anion gap: 10 (ref 5–15)
BUN: 30 mg/dL — ABNORMAL HIGH (ref 8–23)
CO2: 25 mmol/L (ref 22–32)
Calcium: 8.2 mg/dL — ABNORMAL LOW (ref 8.9–10.3)
Chloride: 109 mmol/L (ref 98–111)
Creatinine, Ser: 2.09 mg/dL — ABNORMAL HIGH (ref 0.61–1.24)
GFR calc Af Amer: 34 mL/min — ABNORMAL LOW (ref >60)
GFR calc non Af Amer: 30 mL/min — ABNORMAL LOW (ref >60)
Glucose, Bld: 92 mg/dL (ref 70–99)
Potassium: 2.9 mmol/L — ABNORMAL LOW (ref 3.5–5.1)
Sodium: 144 mmol/L (ref 135–145)

## 2020-05-04 LAB — CBC WITH DIFFERENTIAL/PLATELET
Abs Immature Granulocytes: 0.09 10*3/uL — ABNORMAL HIGH (ref 0.00–0.07)
Basophils Absolute: 0 10*3/uL (ref 0.0–0.1)
Basophils Relative: 0 %
Eosinophils Absolute: 0.2 10*3/uL (ref 0.0–0.5)
Eosinophils Relative: 2 %
HCT: 35.9 % — ABNORMAL LOW (ref 39.0–52.0)
Hemoglobin: 11.2 g/dL — ABNORMAL LOW (ref 13.0–17.0)
Immature Granulocytes: 1 %
Lymphocytes Relative: 17 %
Lymphs Abs: 2.1 10*3/uL (ref 0.7–4.0)
MCH: 31.3 pg (ref 26.0–34.0)
MCHC: 31.2 g/dL (ref 30.0–36.0)
MCV: 100.3 fL — ABNORMAL HIGH (ref 80.0–100.0)
Monocytes Absolute: 0.7 10*3/uL (ref 0.1–1.0)
Monocytes Relative: 6 %
Neutro Abs: 9 10*3/uL — ABNORMAL HIGH (ref 1.7–7.7)
Neutrophils Relative %: 74 %
Platelets: 99 10*3/uL — ABNORMAL LOW (ref 150–400)
RBC: 3.58 MIL/uL — ABNORMAL LOW (ref 4.22–5.81)
RDW: 13.9 % (ref 11.5–15.5)
WBC: 12.2 10*3/uL — ABNORMAL HIGH (ref 4.0–10.5)
nRBC: 0 % (ref 0.0–0.2)

## 2020-05-04 MED ORDER — POTASSIUM CHLORIDE CRYS ER 20 MEQ PO TBCR
40.0000 meq | EXTENDED_RELEASE_TABLET | ORAL | Status: AC
  Administered 2020-05-04 (×2): 40 meq via ORAL
  Filled 2020-05-04 (×2): qty 2

## 2020-05-04 MED ORDER — SODIUM CHLORIDE 0.9 % IV SOLN
2.0000 g | INTRAVENOUS | Status: DC
  Administered 2020-05-04 – 2020-05-05 (×2): 2 g via INTRAVENOUS
  Filled 2020-05-04: qty 20
  Filled 2020-05-04: qty 2

## 2020-05-04 NOTE — Progress Notes (Signed)
OT Cancellation Note  Patient Details Name: Jordan Ward MRN: [id number] DOB: 15-Jan-1941   Cancelled Treatment:    Reason Eval/Treat Not Completed: Fatigue/lethargy limiting ability to participate  Will check on pt next day.   Kari Baars, OT Acute Rehabilitation Services [phone] Office- [phone], Edwena Felty D 05/04/2020, 2:32 PM

## 2020-05-04 NOTE — Care Management Obs Status (Signed)
[place] NOTIFICATION   Patient Details  Name: Jordan Ward MRN: [id number] Date of Birth: 1941-08-16   Medicare Observation Status Notification Given:  Yes    MahabirJuliann Pulse, RN 05/04/2020, 10:32 AM

## 2020-05-04 NOTE — Evaluation (Signed)
Physical Therapy Evaluation Patient Details Name: Jordan Ward MRN: [id number] DOB: [birth year] Today's Date: 05/04/2020   History of Present Illness  79 y.o. male with history of multiple sclerosis with suprapubic catheter which was recently changed 2 weeks ago with history of hypothyroidism sleep apnea recent admission in June for CBD stone and had undergone ERCP with sphincterotomy. pt  with decreasing urine output and some flank pain. CT renal study shows acute left-sided hydroureteronephrosis with ureteral stone  Clinical Impression  Pt admitted with above diagnosis.  Pt amb ~ 140' with RW, amb in household without AD. Pt feels he is a little weaker than normal, but nearing baseline. Will follow in acute setting.   Pt currently with functional limitations due to the deficits listed below (see PT Problem List). Pt will benefit from skilled PT to increase their independence and safety with mobility to allow discharge to the venue listed below.       Follow Up Recommendations No PT follow up    Equipment Recommendations  None recommended by PT    Recommendations for Other Services       Precautions / Restrictions Precautions Precautions: Fall Restrictions Weight Bearing Restrictions: No      Mobility  Bed Mobility Overal bed mobility: Needs Assistance Bed Mobility: Supine to Sit;Sit to Supine     Supine to sit: Min guard Sit to supine: Min assist   General bed mobility comments: incr time, cues for task completion  Transfers Overall transfer level: Needs assistance Equipment used: Rolling walker (2 wheeled) Transfers: Sit to/from Stand Sit to Stand: Min guard;Min assist         General transfer comment: light assist to rise, cues for hand placement  Ambulation/Gait Ambulation/Gait assistance: Min guard Gait Distance (Feet): 140 Feet Assistive device: Rolling walker (2 wheeled) Gait Pattern/deviations: Step-to pattern;Decreased stride length;Narrow base of  support;Drifts right/left     General Gait Details: overall unsteady gait but without overt LOB. min/guard for balance and safety  Stairs            Wheelchair Mobility    Modified Rankin (Stroke Patients Only)       Balance                                             Pertinent Vitals/Pain Pain Assessment: 0-10 Pain Score: 5  Pain Location: back pain Pain Descriptors / Indicators: Sore;Discomfort Pain Intervention(s): Limited activity within patient's tolerance;Monitored during session;Repositioned;Patient requesting pain meds-RN notified    Home Living Family/patient expects to be discharged to:: Private residence Living Arrangements: Spouse/significant other Available Help at Discharge: Family Type of Home: House Home Access: Stairs to enter Entrance Stairs-Rails: Right Entrance Stairs-Number of Steps: 2 Home Layout: One level Home Equipment: Environmental consultant - 2 wheels;Cane - single point      Prior Function Level of Independence: Independent with assistive device(s);Independent         Comments: household independent amb per pt. amb with RW when going out.     Hand Dominance        Extremity/Trunk Assessment   Upper Extremity Assessment Upper Extremity Assessment: Generalized weakness    Lower Extremity Assessment Lower Extremity Assessment: Generalized weakness    Cervical / Trunk Assessment Cervical / Trunk Assessment: Kyphotic  Communication   Communication: No difficulties  Cognition Arousal/Alertness: Awake/alert Behavior During Therapy: WFL for tasks assessed/performed Overall Cognitive Status:  Within Functional Limits for tasks assessed                                        General Comments      Exercises     Assessment/Plan    PT Assessment Patient needs continued PT services  PT Problem List Decreased strength;Decreased mobility;Decreased balance;Pain;Decreased activity tolerance       PT  Treatment Interventions DME instruction;Therapeutic exercise;Gait training;Functional mobility training;Therapeutic activities;Patient/family education    PT Goals (Current goals can be found in the Care Plan section)  Acute Rehab PT Goals Patient Stated Goal: home soon PT Goal Formulation: With patient Time For Goal Achievement: 05/11/20 Potential to Achieve Goals: Good    Frequency Min 3X/week   Barriers to discharge        Co-evaluation               AM-PAC PT "6 Clicks" Mobility  Outcome Measure Help needed turning from your back to your side while in a flat bed without using bedrails?: A Little Help needed moving from lying on your back to sitting on the side of a flat bed without using bedrails?: A Little Help needed moving to and from a bed to a chair (including a wheelchair)?: A Little Help needed standing up from a chair using your arms (e.g., wheelchair or bedside chair)?: A Little Help needed to walk in hospital room?: A Little Help needed climbing 3-5 steps with a railing? : A Little 6 Click Score: 18    End of Session Equipment Utilized During Treatment: Gait belt Activity Tolerance: Patient tolerated treatment well Patient left: in bed;with call bell/phone within reach;with bed alarm set Nurse Communication: Mobility status PT Visit Diagnosis: Difficulty in walking, not elsewhere classified (R26.2);Other abnormalities of gait and mobility (R26.89)    Time: 5038-8828 PT Time Calculation (min) (ACUTE ONLY): 24 min   Charges:   PT Evaluation $PT Eval Low Complexity: 1 Low PT Treatments $Gait Training: 8-22 mins        Baxter Flattery, PT  Acute Rehab Dept ([place]) [phone] Pager [phone]  05/04/2020   [facility] 05/04/2020, 9:29 AM

## 2020-05-04 NOTE — Progress Notes (Signed)
PROGRESS NOTE    BETTY BROOKS  XNT:[id number] DOB: 10/29/1940 DOA: 05/02/2020 PCP: [name], MD    Chief Complaint  Patient presents with  . urinary retention    Brief Narrative:  HPI per Dr. [name] is a 79 y.o. male with history of multiple sclerosis with suprapubic catheter which was recently changed 2 weeks ago with history of hypothyroidism sleep apnea recent admission in June for CBD stone and had undergone ERCP with sphincterotomy has been noticing decreasing urine output since yesterday afternoon with some flank pain.  Had chills and rigors.  Denies nausea vomiting.  ED Course: In the ER at [facility] patient was found to have a fever of 101.1 and CT renal study shows acute left-sided hydroureteronephrosis with ureteral stone.  On-call urologist Dr. Alyson Ingles was consulted by the ER physician who requested nephrostomy tube placement urgently in the setting of possible developing sepsis and Dr. Earleen Newport interventional radiology was consulted and patient was transferred to [facility].  Patient had nephrostomy tube placed and is being admitted for further management.  Patient had blood cultures drawn empiric antibiotic started.  Labs are remarkable for creatinine of 1.8 which increased from baseline of 1.3 WBC count of 13.2 lactic acid was normal EKG shows normal sinus rhythm.  Assessment & Plan:   Principal Problem:   Hydronephrosis due to obstruction of ureter Active Problems:   Bacteremia due to Gram-negative bacteria   Multiple sclerosis (HCC)   Chronic suprapubic catheter (HCC)   Pulmonary fibrosis (HCC)   Tremors of nervous system   Hypothyroidism   Hydronephrosis with obstructing calculus   CKD (chronic kidney disease), stage III   Hydronephrosis of left kidney   Pressure injury of skin   Sepsis secondary to UTI ([place])   Complicated UTI (urinary tract infection)   Acute kidney injury superimposed on CKD (HCC)   Dehydration   Acute  lower UTI   1 acute left-sided hydroureteronephrosis with ureteral stone in the setting of possible early sepsis from UTI and Proteus bacteremia. Patient seen in consultation by urology and also by IR and underwent left-sided nephrostomy tube placement by Dr. Earleen Newport IR.  Blood cultures with Proteus Mirabella's.  Urine cultures with greater than 100,000 colonies of Proteus mirabilis.  Patient still with urethral stone.  Afebrile.  Improving clinically.  Urine output of 2.2 L over the past 24 hours.  Creatinine trending down.  Patient initially was on IV cefepime and IV vancomycin.  IV vancomycin has been discontinued.  Will narrow IV antibiotics and DC IV cefepime and placed on IV Rocephin pending finalization of sensitivities and cultures.  Urology following and appreciate input and recommendations.  2.  Early sepsis secondary to Proteus bacteremia and Proteus UTI.  Patient had presented on admission with a leukocytosis, urinalysis worrisome for UTI, acute renal failure, fever with temp of 101.1.  Patient pancultured with blood cultures positive for Proteus bacteremia likely seeded from the urine as urine cultures are positive for Proteus UTI.  Patient was on IV vancomycin IV cefepime.  IV vancomycin discontinued.  Will narrow IV antibiotics and change IV cefepime to IV Rocephin.  Follow.  3.  Hypokalemia Replete.  4.  Acute renal failure in the solitary functioning kidney on chronic kidney disease stage IIIa Likely secondary to problem #1 and volume depletion..  Status post urostomy tube placement.  Good urine output of 2.2 L over the past 24 hours.  Creatinine trending down.  Continue hydration with IV fluids.  Follow.  5.  Hypernatremia Secondary to dehydration.  Improving with half-normal saline which we will continue for now.  6.  Hypothyroidism Continue home regimen Synthroid.  7.  Obstructive sleep apnea CPAP nightly.  8.  History of tremors  9.  History of pulmonary fibrosis Monitor  with gentle hydration.  10.  History of multiple sclerosis   11.  Chronic pain Continue current pain regimen.  12.  Neurogenic bladder Status post suprapubic catheter.  Urine output improving after nephrostomy tubes placed.  Per urology.   13.  Pressure injury Pressure Ulcer 08/03/15 Stage I -  Intact skin with non-blanchable redness of a localized area usually over a bony prominence. Tip of left great toe red (Active)  08/03/15 1425  Location: Toe (Comment  which one)  Location Orientation: Left (left great toe)  Staging: Stage I -  Intact skin with non-blanchable redness of a localized area usually over a bony prominence.  Wound Description (Comments): Tip of left great toe red  Present on Admission:      Pressure Injury 05/26/17 Stage I -  Intact skin with non-blanchable redness of a localized area usually over a bony prominence.  calleous and non-blanchable redness to ankle (Active)  05/26/17 0157  Location: Ankle  Location Orientation: Right  Staging: Stage I -  Intact skin with non-blanchable redness of a localized area usually over a bony prominence.  Wound Description (Comments):  calleous and non-blanchable redness to ankle  Present on Admission: Yes     Pressure Injury 05/03/20 Sacrum Mid Stage 1 -  Intact skin with non-blanchable redness of a localized area usually over a bony prominence. (Active)  05/03/20 1800  Location: Sacrum  Location Orientation: Mid  Staging: Stage 1 -  Intact skin with non-blanchable redness of a localized area usually over a bony prominence.  Wound Description (Comments):   Present on Admission: Yes        DVT prophylaxis: SCDs Code Status: Full Family Communication: Updated patient.  No family at bedside. Disposition:   Status is: Inpatient    Dispo: The patient is from: Home              Anticipated d/c is to: Home              Anticipated d/c date is: 1 to 2 days.              Patient currently with a bacteremia, on IV  antibiotics, also in acute renal failure.  Not stable for discharge.       Consultants:   IR: Dr. Earleen Newport 05/02/2020  Urology: Dr. Alinda Money 05/03/2020  Procedures:   CT renal stone protocol 05/02/2020  Chest x-ray 05/02/2020  Left nephrostomy tube placement per IR: Dr. Earleen Newport 05/02/2020  Antimicrobials:   IV cefepime 05/02/2020>>> 05/04/2020  IV Flagyl 05/03/2019 21x1 dose  IV vancomycin 05/02/2020>>> 05/03/2020  IV Rocephin 05/04/2020   Subjective: Sitting up in bed.  Denies any chest pain or shortness of breath.  Complains of some discomfort/pain around nephrostomy tube site.  Feeling better than on admission.  Objective: Vitals:   05/03/20 2224 05/04/20 0145 05/04/20 0522 05/04/20 1226  BP: 131/67 127/70 140/74 (!) 148/75  Pulse: 65 60 64 (!) 58  Resp: 18 18 20 20   Temp: 99.5 F (37.5 C) 99 F (37.2 C) 98.9 F (37.2 C) 98.9 F (37.2 C)  TempSrc: Oral Oral Oral Oral  SpO2: 96% 95% 94% 97%  Weight:   52.6 kg   Height:  Intake/Output Summary (Last 24 hours) at 05/04/2020 1548 Last data filed at 05/04/2020 0957 Gross per 24 hour  Intake 240 ml  Output 2250 ml  Net -2010 ml   Filed Weights   05/02/20 1644 05/04/20 0522  Weight: 51 kg 52.6 kg    Examination:  General exam: Appears calm and comfortable  Respiratory system: Clear to auscultation. Respiratory effort normal. Cardiovascular system: S1 & S2 heard, RRR. No JVD, murmurs, rubs, gallops or clicks. No pedal edema. Gastrointestinal system: Abdomen is nondistended, soft and some tenderness to palpation around nephrostomy site.  Positive bowel sounds.  No rebound.  No guarding.  Central nervous system: Alert and oriented. No focal neurological deficits. Extremities: Symmetric 5 x 5 power. Skin: No rashes, lesions or ulcers Psychiatry: Judgement and insight appear normal. Mood & affect appropriate.     Data Reviewed: I have personally reviewed following labs and imaging studies  CBC: Recent Labs  Lab  05/02/20 1718 05/03/20 0500 05/04/20 0504  WBC 13.2* 18.8* 12.2*  NEUTROABS 10.9* 16.1* 9.0*  HGB 13.3 12.8* 11.2*  HCT 42.1 39.7 35.9*  MCV 98.6 98.3 100.3*  PLT 157 160 99*    Basic Metabolic Panel: Recent Labs  Lab 05/02/20 1718 05/03/20 0500 05/03/20 1353 05/04/20 0504  NA 139 139 147* 144  K 3.4* 6.1* 3.1* 2.9*  CL 104 108 109 109  CO2 25 24 26 25   GLUCOSE 162* 127* 140* 92  BUN 33* 30* 30* 30*  CREATININE 1.89* 2.61* 2.17* 2.09*  CALCIUM 8.9 8.1* 8.6* 8.2*  PHOS  --   --  2.5  --     GFR: Estimated Creatinine Clearance: 22 mL/min (A) (by C-G formula based on SCr of 2.09 mg/dL (H)).  Liver Function Tests: Recent Labs  Lab 05/02/20 1718 05/03/20 0500 05/03/20 1353  AST 18 54*  --   ALT 13 20  --   ALKPHOS 93 70  --   BILITOT 0.6 2.4*  --   PROT 7.7 5.6*  --   ALBUMIN 4.4 3.4* 3.6    CBG: No results for input(s): GLUCAP in the last 168 hours.   Recent Results (from the past 240 hour(s))  Blood culture (routine single)     Status: None (Preliminary result)   Collection Time: 05/02/20  5:18 PM   Specimen: BLOOD LEFT FOREARM  Result Value Ref Range Status   Specimen Description BLOOD LEFT FOREARM  Final   Special Requests   Final    BOTTLES DRAWN AEROBIC AND ANAEROBIC Blood Culture adequate volume   Culture   Final    NO GROWTH 2 DAYS Performed at [facility], [address] [place], [place] [address]    Report Status PENDING  Incomplete  SARS Coronavirus 2 by RT PCR (hospital order, performed in [place] hospital lab) Nasopharyngeal Nasopharyngeal Swab     Status: None   Collection Time: 05/02/20  5:51 PM   Specimen: Nasopharyngeal Swab  Result Value Ref Range Status   SARS Coronavirus 2 NEGATIVE NEGATIVE Final    Comment: (NOTE) SARS-CoV-2 target nucleic acids are NOT DETECTED.  The SARS-CoV-2 RNA is generally detectable in upper and lower respiratory specimens during the acute phase of infection. The lowest concentration of SARS-CoV-2  viral copies this assay can detect is 250 copies / mL. A negative result does not preclude SARS-CoV-2 infection and should not be used as the sole basis for treatment or other patient management decisions.  A negative result may occur with improper specimen collection /  handling, submission of specimen other than nasopharyngeal swab, presence of viral mutation(s) within the areas targeted by this assay, and inadequate number of viral copies (<250 copies / mL). A negative result must be combined with clinical observations, patient history, and epidemiological information.  Fact Sheet for Patients:   StrictlyIdeas.no  Fact Sheet for Healthcare Providers: BankingDealers.co.za  This test is not yet approved or  cleared by the [place] FDA and has been authorized for detection and/or diagnosis of SARS-CoV-2 by FDA under an Emergency Use Authorization (EUA).  This EUA will remain in effect (meaning this test can be used) for the duration of the COVID-19 declaration under Section 564(b)(1) of the Act, 21 U.S.C. section 360bbb-3(b)(1), unless the authorization is terminated or revoked sooner.  Performed at [facility], [address], [place], [place] [address]   Culture, blood (single)     Status: Abnormal (Preliminary result)   Collection Time: 05/02/20  6:11 PM   Specimen: BLOOD RIGHT HAND  Result Value Ref Range Status   Specimen Description   Final    BLOOD RIGHT HAND Performed at [facility], [address] [place], [place] [address]    Special Requests   Final    BOTTLES DRAWN AEROBIC AND ANAEROBIC Blood Culture adequate volume Performed at [facility], [address], [place], [place] [address]    Culture  Setup Time   Final    GRAM NEGATIVE RODS ANAEROBIC BOTTLE ONLY Gram Stain Report Called to,Read Back By and Verified With: GARRISON G. AT [id number] ON [id number] BY Sherese Heyward S. Organism ID to follow CRITICAL RESULT CALLED TO,  READ BACK BY AND VERIFIED WITH: S CHRISTY PHARMD 1607 05/03/20 A BROWNING    Culture (A)  Final    PROTEUS MIRABILIS SUSCEPTIBILITIES TO FOLLOW Performed at [facility], [place] [address] [place], [place] [address]    Report Status PENDING  Incomplete  Blood Culture ID Panel (Reflexed)     Status: Abnormal   Collection Time: 05/02/20  6:11 PM  Result Value Ref Range Status   Enterococcus faecalis NOT DETECTED NOT DETECTED Final   Enterococcus Faecium NOT DETECTED NOT DETECTED Final   Listeria monocytogenes NOT DETECTED NOT DETECTED Final   Staphylococcus species NOT DETECTED NOT DETECTED Final   Staphylococcus aureus (BCID) NOT DETECTED NOT DETECTED Final   Staphylococcus epidermidis NOT DETECTED NOT DETECTED Final   Staphylococcus lugdunensis NOT DETECTED NOT DETECTED Final   Streptococcus species NOT DETECTED NOT DETECTED Final   Streptococcus agalactiae NOT DETECTED NOT DETECTED Final   Streptococcus pneumoniae NOT DETECTED NOT DETECTED Final   Streptococcus pyogenes NOT DETECTED NOT DETECTED Final   A.calcoaceticus-baumannii NOT DETECTED NOT DETECTED Final   Bacteroides fragilis NOT DETECTED NOT DETECTED Final   Enterobacterales DETECTED (A) NOT DETECTED Final    Comment: Enterobacterales represent a large order of gram negative bacteria, not a single organism. CRITICAL RESULT CALLED TO, READ BACK BY AND VERIFIED WITH: S CHRISTY PHARMD 1607 05/03/20 A BROWNING    Enterobacter cloacae complex NOT DETECTED NOT DETECTED Final   Escherichia coli NOT DETECTED NOT DETECTED Final   Klebsiella aerogenes NOT DETECTED NOT DETECTED Final   Klebsiella oxytoca NOT DETECTED NOT DETECTED Final   Klebsiella pneumoniae NOT DETECTED NOT DETECTED Final   Proteus species DETECTED (A) NOT DETECTED Final    Comment: CRITICAL RESULT CALLED TO, READ BACK BY AND VERIFIED WITH: S CHRISTY PHARMD 1607 05/03/20 A BROWNING    Salmonella species NOT DETECTED NOT DETECTED Final   Serratia  marcescens NOT  DETECTED NOT DETECTED Final   Haemophilus influenzae NOT DETECTED NOT DETECTED Final   Neisseria meningitidis NOT DETECTED NOT DETECTED Final   Pseudomonas aeruginosa NOT DETECTED NOT DETECTED Final   Stenotrophomonas maltophilia NOT DETECTED NOT DETECTED Final   Candida albicans NOT DETECTED NOT DETECTED Final   Candida auris NOT DETECTED NOT DETECTED Final   Candida glabrata NOT DETECTED NOT DETECTED Final   Candida krusei NOT DETECTED NOT DETECTED Final   Candida parapsilosis NOT DETECTED NOT DETECTED Final   Candida tropicalis NOT DETECTED NOT DETECTED Final   Cryptococcus neoformans/gattii NOT DETECTED NOT DETECTED Final   CTX-M ESBL NOT DETECTED NOT DETECTED Final   Carbapenem resistance IMP NOT DETECTED NOT DETECTED Final   Carbapenem resistance KPC NOT DETECTED NOT DETECTED Final   Carbapenem resistance NDM NOT DETECTED NOT DETECTED Final   Carbapenem resist OXA 48 LIKE NOT DETECTED NOT DETECTED Final   Carbapenem resistance VIM NOT DETECTED NOT DETECTED Final    Comment: Performed at [facility], [address] [place], [place] [address]  Aerobic/Anaerobic Culture (surgical/deep wound)     Status: None (Preliminary result)   Collection Time: 05/03/20 12:08 AM   Specimen: Kidney; Urine  Result Value Ref Range Status   Specimen Description   Final    KIDNEY Performed at [place] [address], [place], [place] [address]    Special Requests   Final    NONE Performed at [facility], [place] [address], [place], [place] [address]    Gram Stain   Final    ABUNDANT WBC PRESENT, PREDOMINANTLY PMN RARE GRAM NEGATIVE RODS    Culture   Final    MODERATE PROTEUS MIRABILIS SUSCEPTIBILITIES TO FOLLOW Performed at [facility], [place] [address], [place], [place] [address]    Report Status PENDING  Incomplete  Culture, Urine     Status: Abnormal (Preliminary result)   Collection Time: 05/03/20  9:33 AM   Specimen: Urine,  Catheterized  Result Value Ref Range Status   Specimen Description   Final    URINE, CATHETERIZED Performed at [facility], [place] [address], [place], [place] [address]    Special Requests   Final    NONE Performed at [facility], [place] [address] [place], [place] [address]    Culture (A)  Final    >=100,000 COLONIES/mL PROTEUS MIRABILIS SUSCEPTIBILITIES TO FOLLOW Performed at [facility], [place] [address] [place], [place] [address]    Report Status PENDING  Incomplete         Radiology Studies: DG Chest Port 1 View  Result Date: 05/02/2020 CLINICAL DATA:  Sepsis. EXAM: PORTABLE CHEST 1 VIEW COMPARISON:  Ten thousand eighteen FINDINGS: Chronic bronchitic changes are noted bilaterally. There is no pneumothorax. No pleural effusion. No acute osseous abnormality. There are old healed left-sided rib fractures. The heart size is unchanged. Aortic calcifications are noted. There is a lucency projecting over the partially visualized liver shadow. IMPRESSION: 1. No acute cardiopulmonary process. 2. Lucency projecting over the partially visualized liver shadow. This may represent pneumobilia, perhaps from recent sphincterotomy. Correlation with a dedicated abdominal radiograph is recommended. Electronically Signed   By: Constance Holster M.D.   On: 05/02/2020 17:35   CT Renal Stone Study  Result Date: 05/02/2020 CLINICAL DATA:  Flank pain. Chronic suprapubic catheter replaced last week. EXAM: CT ABDOMEN AND PELVIS WITHOUT CONTRAST TECHNIQUE: Multidetector CT imaging of the abdomen and pelvis  was performed following the standard protocol without IV contrast. COMPARISON:  CT dated March 01, 2020 FINDINGS: Lower chest: There are chronic changes at the lung bases bilaterally with areas of subpleural reticulation.The heart size is normal. Hepatobiliary: The liver is normal. Status post cholecystectomy.There is pneumobilia primarily within the left hepatic lobe.  This corresponds well with the finding seen on the patient's recent chest x-ray and is likely related to prior sphincterotomy. Pancreas: Normal contours without ductal dilatation. No peripancreatic fluid collection. Spleen: Unremarkable. Adrenals/Urinary Tract: --Adrenal glands: Unremarkable. --Right kidney/ureter: The right kidney is atrophic without evidence for hydronephrosis. There are nonobstructing stones in the lower pole the right kidney. --Left kidney/ureter: There is new severe left-sided hydroureteronephrosis secondary to an obstructing stone in the distal left ureter measuring approximately 8 mm (axial series 2, image 57). There may be an additional stone fragment at the left UVJ measuring approximately 5 mm (axial series 2, image 70). There are residual stones in the left kidney measuring up to approximately 1 cm in the lower pole. --Urinary bladder: The bladder is decompressed with a suprapubic catheter. The catheter appears to be grossly well position. There is hyperdense material within the collapsed urinary bladder which may represent contrast or stone material/gravel. Stomach/Bowel: --Stomach/Duodenum: No hiatal hernia or other gastric abnormality. Normal duodenal course and caliber. --Small bowel: Unremarkable. --Colon: The patient is status post prior left hemicolectomy. There is a surgical anastomosis in the patient's pelvis that is patent. --Appendix: Surgically absent. Vascular/Lymphatic: Atherosclerotic calcification is present within the non-aneurysmal abdominal aorta, without hemodynamically significant stenosis. 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 --No retroperitoneal lymphadenopathy. --No mesenteric lymphadenopathy. --No pelvic or inguinal lymphadenopathy. Reproductive: Unremarkable Other: Again noted is significant diastasis of the low anterior abdominal [place]. Musculoskeletal. There is avascular necrosis of the right  femoral head. IMPRESSION: 1. New severe left-sided hydroureteronephrosis secondary to an obstructing stone in the distal left ureter measuring approximately 8 mm. There may be an additional stone fragment at the left UVJ measuring approximately 5 mm. 2. Bilateral nonobstructing nephrolithiasis.  Atrophic right kidney. 3. The bladder is decompressed with a suprapubic catheter. There is hyperdense material within the collapsed urinary bladder which may represent contrast or stone material/gravel. 4. Pneumobilia, likely related to interval sphincterotomy. This explains the finding seen on the patient's recent chest x-ray. 5. Avascular necrosis of the right femoral head. 6. 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 7. Additional chronic findings as detailed above. Aortic Atherosclerosis (ICD10-I70.0). Electronically Signed   By: Constance Holster M.D.   On: 05/02/2020 19:28   IR NEPHROSTOMY PLACEMENT LEFT  Result Date: 05/03/2020 INDICATION: 79 year old male with a history of solitary left kidney and hydronephrosis secondary to obstructive left ureteral stone EXAM: IR NEPHROSTOMY PLACEMENT LEFT COMPARISON:  None. MEDICATIONS: None ANESTHESIA/SEDATION: Fentanyl 1.0 mcg IV; Versed 50 mg IV Moderate Sedation Time:  10 minutes The patient was continuously monitored during the procedure by the interventional radiology nurse under my direct supervision. CONTRAST:  44mL OMNIPAQUE IOHEXOL 300 MG/ML SOLN - administered into the collecting system(s) FLUOROSCOPY TIME:  Fluoroscopy Time: 0 minutes 24 seconds (3 mGy). COMPLICATIONS: None PROCEDURE: Informed written consent was obtained from the patient after a thorough discussion of the procedural risks, benefits and alternatives. All questions were addressed. Maximal Sterile Barrier Technique was utilized including caps, mask, sterile gowns, sterile gloves, sterile drape, hand hygiene and skin antiseptic. A  timeout was performed prior to  the initiation of the procedure. Patient positioned prone position on the fluoroscopy table. Ultrasound survey of the left flank was performed with images stored and sent to PACs. The patient was then prepped and draped in the usual sterile fashion. 1% lidocaine was used to anesthetize the skin and subcutaneous tissues for local anesthesia. A Chiba needle was then used to access a posterior inferior calyx with ultrasound guidance. With spontaneous urine returned through the needle, passage of an 018 micro wire into the collecting system was performed under fluoroscopy. A small incision was made with an 11 blade scalpel, and the needle was removed from the wire. An Envy system was then advanced over the wire into the collecting system under fluoroscopy. The metal stiffener and inner dilator were removed, and then a sample of fluid was aspirated through the 4 French outer sheath. Bentson wire was passed into the collecting system and the sheath removed. Ten French dilation of the soft tissues was performed. Using modified Seldinger technique, a 10 French pigtail catheter drain was placed over the Bentson wire. Wire and inner stiffener removed, and the pigtail was formed in the collecting system. Small amount of contrast confirmed position of the catheter. Patient tolerated the procedure well and remained hemodynamically stable throughout. No complications were encountered and no significant blood loss encountered IMPRESSION: Status post image guided left-sided percutaneous nephrostomy. Signed, Dulcy Fanny. Dellia Nims, RPVI [facility] Electronically Signed   By: Corrie Mckusick D.O.   On: 05/03/2020 08:56        Scheduled Meds: . amLODipine  5 mg Oral Daily  . carvedilol  6.25 mg Oral BID WC  . levothyroxine  75 mcg Oral Q0600   Continuous Infusions: . sodium chloride 100 mL/hr at 05/04/20 1429  . ceFEPime (MAXIPIME) IV 2  g (05/03/20 1817)     LOS: 1 day    Time spent: 40 minutes    Irine Seal, MD [facility]   To contact the attending provider between 7A-7P or the covering provider during after hours 7P-7A, please log into the web site [email or web address] and access using universal [place] password for that web site. If you do not have the password, please call the hospital operator.  05/04/2020, 3:48 PM

## 2020-05-04 NOTE — Progress Notes (Signed)
Subjective: Patient reports that he is feeling better.  No current pain.  Objective: Vital signs in last 24 hours: Temp:  [98.8 F (37.1 C)-99.9 F (37.7 C)] 98.9 F (37.2 C) (08/06 0522) Pulse Rate:  [60-85] 64 (08/06 0522) Resp:  [12-29] 20 (08/06 0522) BP: (110-174)/(6-107) 140/74 (08/06 0522) SpO2:  [92 %-98 %] 94 % (08/06 0522) Weight:  [52.6 kg] 52.6 kg (08/06 0522)  Intake/Output from previous day: 08/05 0701 - 08/06 0700 In: 1043.3 [I.V.:1043.3] Out: 2200 [Urine:2200] Intake/Output this shift: No intake/output data recorded.  Physical Exam:  Constitutional: Vital signs reviewed. WD WN in NAD   Eyes: PERRL, No scleral icterus.   Cardiovascular: RRR Pulmonary/Chest: Normal effort Extremities: No cyanosis or edema   Lab Results: Recent Labs    05/02/20 1718 05/03/20 0500 05/04/20 0504  HGB 13.3 12.8* 11.2*  HCT 42.1 39.7 35.9*   BMET Recent Labs    05/03/20 1353 05/04/20 0504  NA 147* 144  K 3.1* 2.9*  CL 109 109  CO2 26 25  GLUCOSE 140* 92  BUN 30* 30*  CREATININE 2.17* 2.09*  CALCIUM 8.6* 8.2*   Recent Labs    05/02/20 1718  INR 1.0   No results for input(s): LABURIN in the last 72 hours. Results for orders placed or performed during the hospital encounter of 05/02/20  Blood culture (routine single)     Status: None (Preliminary result)   Collection Time: 05/02/20  5:18 PM   Specimen: BLOOD LEFT FOREARM  Result Value Ref Range Status   Specimen Description BLOOD LEFT FOREARM  Final   Special Requests   Final    BOTTLES DRAWN AEROBIC AND ANAEROBIC Blood Culture adequate volume   Culture   Final    NO GROWTH < 12 HOURS Performed at [facility], [address], [place], [place] [address]    Report Status PENDING  Incomplete  SARS Coronavirus 2 by RT PCR (hospital order, performed in [place] hospital lab) Nasopharyngeal Nasopharyngeal Swab     Status: None   Collection Time: 05/02/20  5:51 PM   Specimen: Nasopharyngeal Swab  Result  Value Ref Range Status   SARS Coronavirus 2 NEGATIVE NEGATIVE Final    Comment: (NOTE) SARS-CoV-2 target nucleic acids are NOT DETECTED.  The SARS-CoV-2 RNA is generally detectable in upper and lower respiratory specimens during the acute phase of infection. The lowest concentration of SARS-CoV-2 viral copies this assay can detect is 250 copies / mL. A negative result does not preclude SARS-CoV-2 infection and should not be used as the sole basis for treatment or other patient management decisions.  A negative result may occur with improper specimen collection / handling, submission of specimen other than nasopharyngeal swab, presence of viral mutation(s) within the areas targeted by this assay, and inadequate number of viral copies (<250 copies / mL). A negative result must be combined with clinical observations, patient history, and epidemiological information.  Fact Sheet for Patients:   StrictlyIdeas.no  Fact Sheet for Healthcare Providers: BankingDealers.co.za  This test is not yet approved or  cleared by the [place] FDA and has been authorized for detection and/or diagnosis of SARS-CoV-2 by FDA under an Emergency Use Authorization (EUA).  This EUA will remain in effect (meaning this test can be used) for the duration of the COVID-19 declaration under Section 564(b)(1) of the Act, 21 U.S.C. section 360bbb-3(b)(1), unless the authorization is terminated or revoked sooner.  Performed at [facility], [address] [place], [place] [address]  Culture, blood (single)     Status: None (Preliminary result)   Collection Time: 05/02/20  6:11 PM   Specimen: BLOOD RIGHT HAND  Result Value Ref Range Status   Specimen Description   Final    BLOOD RIGHT HAND Performed at [facility], [address] [place], [place] [address]    Special Requests   Final    BOTTLES DRAWN AEROBIC AND ANAEROBIC Blood Culture adequate  volume Performed at [facility], [address] [place], [place] [address]    Culture  Setup Time   Final    GRAM NEGATIVE RODS ANAEROBIC BOTTLE ONLY Gram Stain Report Called to,Read Back By and Verified With: GARRISON G. AT [id number] ON [id number] BY THOMPSON S. Organism ID to follow CRITICAL RESULT CALLED TO, READ BACK BY AND VERIFIED WITHDomenick Gong PHARMD 1607 05/03/20 A BROWNING Performed at [facility], [place] [address] [place], [place] [address]    Culture PENDING  Incomplete   Report Status PENDING  Incomplete  Blood Culture ID Panel (Reflexed)     Status: Abnormal   Collection Time: 05/02/20  6:11 PM  Result Value Ref Range Status   Enterococcus faecalis NOT DETECTED NOT DETECTED Final   Enterococcus Faecium NOT DETECTED NOT DETECTED Final   Listeria monocytogenes NOT DETECTED NOT DETECTED Final   Staphylococcus species NOT DETECTED NOT DETECTED Final   Staphylococcus aureus (BCID) NOT DETECTED NOT DETECTED Final   Staphylococcus epidermidis NOT DETECTED NOT DETECTED Final   Staphylococcus lugdunensis NOT DETECTED NOT DETECTED Final   Streptococcus species NOT DETECTED NOT DETECTED Final   Streptococcus agalactiae NOT DETECTED NOT DETECTED Final   Streptococcus pneumoniae NOT DETECTED NOT DETECTED Final   Streptococcus pyogenes NOT DETECTED NOT DETECTED Final   A.calcoaceticus-baumannii NOT DETECTED NOT DETECTED Final   Bacteroides fragilis NOT DETECTED NOT DETECTED Final   Enterobacterales DETECTED (A) NOT DETECTED Final    Comment: Enterobacterales represent a large order of gram negative bacteria, not a single organism. CRITICAL RESULT CALLED TO, READ BACK BY AND VERIFIED WITH: S CHRISTY PHARMD 1607 05/03/20 A BROWNING    Enterobacter cloacae complex NOT DETECTED NOT DETECTED Final   Escherichia coli NOT DETECTED NOT DETECTED Final   Klebsiella aerogenes NOT DETECTED NOT DETECTED Final   Klebsiella oxytoca NOT DETECTED NOT DETECTED Final   Klebsiella pneumoniae NOT  DETECTED NOT DETECTED Final   Proteus species DETECTED (A) NOT DETECTED Final    Comment: CRITICAL RESULT CALLED TO, READ BACK BY AND VERIFIED WITH: S CHRISTY PHARMD 1607 05/03/20 A BROWNING    Salmonella species NOT DETECTED NOT DETECTED Final   Serratia marcescens NOT DETECTED NOT DETECTED Final   Haemophilus influenzae NOT DETECTED NOT DETECTED Final   Neisseria meningitidis NOT DETECTED NOT DETECTED Final   Pseudomonas aeruginosa NOT DETECTED NOT DETECTED Final   Stenotrophomonas maltophilia NOT DETECTED NOT DETECTED Final   Candida albicans NOT DETECTED NOT DETECTED Final   Candida auris NOT DETECTED NOT DETECTED Final   Candida glabrata NOT DETECTED NOT DETECTED Final   Candida krusei NOT DETECTED NOT DETECTED Final   Candida parapsilosis NOT DETECTED NOT DETECTED Final   Candida tropicalis NOT DETECTED NOT DETECTED Final   Cryptococcus neoformans/gattii NOT DETECTED NOT DETECTED Final   CTX-M ESBL NOT DETECTED NOT DETECTED Final   Carbapenem resistance IMP NOT DETECTED NOT DETECTED Final   Carbapenem resistance KPC NOT DETECTED NOT DETECTED Final   Carbapenem resistance NDM NOT DETECTED NOT DETECTED Final   Carbapenem resist OXA 48 LIKE  NOT DETECTED NOT DETECTED Final   Carbapenem resistance VIM NOT DETECTED NOT DETECTED Final    Comment: Performed at [facility], [place] [address] [place], [place] [address]  Aerobic/Anaerobic Culture (surgical/deep wound)     Status: None (Preliminary result)   Collection Time: 05/03/20 12:08 AM   Specimen: Kidney; Urine  Result Value Ref Range Status   Specimen Description   Final    KIDNEY Performed at [place] [address], [place], [place] [address]    Special Requests   Final    NONE Performed at [facility], [place] [address] [place], [place] [address]    Gram Stain   Final    ABUNDANT WBC PRESENT, PREDOMINANTLY PMN RARE GRAM NEGATIVE RODS Performed at [facility], [place] [address] [place], [place] [address]    Culture PENDING  Incomplete   Report Status PENDING  Incomplete    Studies/Results: DG Chest Port 1 View  Result Date: 05/02/2020 CLINICAL DATA:  Sepsis. EXAM: PORTABLE CHEST 1 VIEW COMPARISON:  Ten thousand eighteen FINDINGS: Chronic bronchitic changes are noted bilaterally. There is no pneumothorax. No pleural effusion. No acute osseous abnormality. There are old healed left-sided rib fractures. The heart size is unchanged. Aortic calcifications are noted. There is a lucency projecting over the partially visualized liver shadow. IMPRESSION: 1. No acute cardiopulmonary process. 2. Lucency projecting over the partially visualized liver shadow. This may represent pneumobilia, perhaps from recent sphincterotomy. Correlation with a dedicated abdominal radiograph is recommended. Electronically Signed   By: Constance Holster M.D.   On: 05/02/2020 17:35   CT Renal Stone Study  Result Date: 05/02/2020 CLINICAL DATA:  Flank pain. Chronic suprapubic catheter replaced last week. EXAM: CT ABDOMEN AND PELVIS WITHOUT CONTRAST TECHNIQUE: Multidetector CT imaging of the abdomen and pelvis was performed following the standard protocol without IV contrast. COMPARISON:  CT dated March 01, 2020 FINDINGS: Lower chest: There are chronic changes at the lung bases bilaterally with areas of subpleural reticulation.The heart size is normal. Hepatobiliary: The liver is normal. Status post cholecystectomy.There is pneumobilia primarily within the left hepatic lobe. This corresponds well with the finding seen on the patient's recent chest x-ray and is likely related to prior sphincterotomy. Pancreas: Normal contours without ductal dilatation. No peripancreatic fluid collection. Spleen: Unremarkable. Adrenals/Urinary Tract: --Adrenal glands: Unremarkable. --Right kidney/ureter: The right kidney is atrophic without evidence for hydronephrosis. There are nonobstructing stones in the lower pole the  right kidney. --Left kidney/ureter: There is new severe left-sided hydroureteronephrosis secondary to an obstructing stone in the distal left ureter measuring approximately 8 mm (axial series 2, image 57). There may be an additional stone fragment at the left UVJ measuring approximately 5 mm (axial series 2, image 70). There are residual stones in the left kidney measuring up to approximately 1 cm in the lower pole. --Urinary bladder: The bladder is decompressed with a suprapubic catheter. The catheter appears to be grossly well position. There is hyperdense material within the collapsed urinary bladder which may represent contrast or stone material/gravel. Stomach/Bowel: --Stomach/Duodenum: No hiatal hernia or other gastric abnormality. Normal duodenal course and caliber. --Small bowel: Unremarkable. --Colon: The patient is status post prior left hemicolectomy. There is a surgical anastomosis in the patient's pelvis that is patent. --Appendix: Surgically absent. Vascular/Lymphatic: Atherosclerotic calcification is present within the non-aneurysmal abdominal aorta, without hemodynamically significant stenosis. 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 --No retroperitoneal lymphadenopathy. --No  mesenteric lymphadenopathy. --No pelvic or inguinal lymphadenopathy. Reproductive: Unremarkable Other: Again noted is significant diastasis of the low anterior abdominal Rios. Musculoskeletal. 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 IMPRESSION: 1. New severe left-sided hydroureteronephrosis secondary to an obstructing stone in the distal left ureter measuring approximately 8 mm. There may be an additional stone fragment at the left UVJ measuring approximately 5 mm. 2. Bilateral nonobstructing nephrolithiasis.  Atrophic right kidney. 3. The bladder is decompressed with a suprapubic catheter. There is hyperdense material within the collapsed urinary  bladder which may represent contrast or stone material/gravel. 4. Pneumobilia, likely related to interval sphincterotomy. This explains the finding seen on the patient's recent chest x-ray. 5. Avascular necrosis of the right femoral head. 6. 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 7. Additional chronic findings as detailed above. Aortic Atherosclerosis (ICD10-I70.0). Electronically Signed   By: Constance Holster M.D.   On: 05/02/2020 19:28   IR NEPHROSTOMY PLACEMENT LEFT  Result Date: 05/03/2020 INDICATION: 79 year old male with a history of solitary left kidney and hydronephrosis secondary to obstructive left ureteral stone EXAM: IR NEPHROSTOMY PLACEMENT LEFT COMPARISON:  None. MEDICATIONS: None ANESTHESIA/SEDATION: Fentanyl 1.0 mcg IV; Versed 50 mg IV Moderate Sedation Time:  10 minutes The patient was continuously monitored during the procedure by the interventional radiology nurse under my direct supervision. CONTRAST:  18mL OMNIPAQUE IOHEXOL 300 MG/ML SOLN - administered into the collecting system(s) FLUOROSCOPY TIME:  Fluoroscopy Time: 0 minutes 24 seconds (3 mGy). COMPLICATIONS: None PROCEDURE: Informed written consent was obtained from the patient after a thorough discussion of the procedural risks, benefits and alternatives. All questions were addressed. Maximal Sterile Barrier Technique was utilized including caps, mask, sterile gowns, sterile gloves, sterile drape, hand hygiene and skin antiseptic. A timeout was performed prior to the initiation of the procedure. Patient positioned prone position on the fluoroscopy table. Ultrasound survey of the left flank was performed with images stored and sent to PACs. The patient was then prepped and draped in the usual sterile fashion. 1% lidocaine was used to anesthetize the skin and subcutaneous tissues for local anesthesia. A Chiba needle was then used to access a posterior inferior calyx  with ultrasound guidance. With spontaneous urine returned through the needle, passage of an 018 micro wire into the collecting system was performed under fluoroscopy. A small incision was made with an 11 blade scalpel, and the needle was removed from the wire. An Envy system was then advanced over the wire into the collecting system under fluoroscopy. The metal stiffener and inner dilator were removed, and then a sample of fluid was aspirated through the 4 French outer sheath. Bentson wire was passed into the collecting system and the sheath removed. Ten French dilation of the soft tissues was performed. Using modified Seldinger technique, a 10 French pigtail catheter drain was placed over the Bentson wire. Wire and inner stiffener removed, and the pigtail was formed in the collecting system. Small amount of contrast confirmed position of the catheter. Patient tolerated the procedure well and remained hemodynamically stable throughout. No complications were encountered and no significant blood loss encountered IMPRESSION: Status post image guided left-sided percutaneous nephrostomy. Signed, Dulcy Fanny. Dellia Nims, RPVI [facility] Radiology Electronically Signed   By: Corrie Mckusick D.O.   On: 05/03/2020 08:56    Assessment/Plan:   Left ureteral calculi with UTI, nephrostomy tube placed.  He will eventually need ureteroscopic management    Okay to  go home from a urologic point at any time.  We will call him to set up stone management.   LOS: 1 day   [name] 05/04/2020, 7:51 AM

## 2020-05-05 LAB — CBC WITH DIFFERENTIAL/PLATELET
Abs Immature Granulocytes: 0.03 10*3/uL (ref 0.00–0.07)
Basophils Absolute: 0.1 10*3/uL (ref 0.0–0.1)
Basophils Relative: 1 %
Eosinophils Absolute: 0.4 10*3/uL (ref 0.0–0.5)
Eosinophils Relative: 5 %
HCT: 35.2 % — ABNORMAL LOW (ref 39.0–52.0)
Hemoglobin: 11.3 g/dL — ABNORMAL LOW (ref 13.0–17.0)
Immature Granulocytes: 0 %
Lymphocytes Relative: 27 %
Lymphs Abs: 2.5 10*3/uL (ref 0.7–4.0)
MCH: 31.7 pg (ref 26.0–34.0)
MCHC: 32.1 g/dL (ref 30.0–36.0)
MCV: 98.6 fL (ref 80.0–100.0)
Monocytes Absolute: 0.4 10*3/uL (ref 0.1–1.0)
Monocytes Relative: 5 %
Neutro Abs: 5.7 10*3/uL (ref 1.7–7.7)
Neutrophils Relative %: 62 %
Platelets: 100 10*3/uL — ABNORMAL LOW (ref 150–400)
RBC: 3.57 MIL/uL — ABNORMAL LOW (ref 4.22–5.81)
RDW: 13.5 % (ref 11.5–15.5)
WBC: 9 10*3/uL (ref 4.0–10.5)
nRBC: 0 % (ref 0.0–0.2)

## 2020-05-05 LAB — RENAL FUNCTION PANEL
Albumin: 3 g/dL — ABNORMAL LOW (ref 3.5–5.0)
Anion gap: 11 (ref 5–15)
BUN: 24 mg/dL — ABNORMAL HIGH (ref 8–23)
CO2: 24 mmol/L (ref 22–32)
Calcium: 8.3 mg/dL — ABNORMAL LOW (ref 8.9–10.3)
Chloride: 105 mmol/L (ref 98–111)
Creatinine, Ser: 1.54 mg/dL — ABNORMAL HIGH (ref 0.61–1.24)
GFR calc Af Amer: 50 mL/min — ABNORMAL LOW (ref >60)
GFR calc non Af Amer: 43 mL/min — ABNORMAL LOW (ref >60)
Glucose, Bld: 118 mg/dL — ABNORMAL HIGH (ref 70–99)
Phosphorus: 2.3 mg/dL — ABNORMAL LOW (ref 2.5–4.6)
Potassium: 3 mmol/L — ABNORMAL LOW (ref 3.5–5.1)
Sodium: 140 mmol/L (ref 135–145)

## 2020-05-05 LAB — URINE CULTURE: Culture: >=100000 — AB

## 2020-05-05 LAB — GLUCOSE, CAPILLARY: Glucose-Capillary: 135 mg/dL — ABNORMAL HIGH (ref 70–99)

## 2020-05-05 MED ORDER — POTASSIUM CHLORIDE CRYS ER 20 MEQ PO TBCR
40.0000 meq | EXTENDED_RELEASE_TABLET | ORAL | Status: AC
  Administered 2020-05-05 (×2): 40 meq via ORAL
  Filled 2020-05-05 (×2): qty 2

## 2020-05-05 MED ORDER — AMLODIPINE BESYLATE 10 MG PO TABS
10.0000 mg | ORAL_TABLET | Freq: Every day | ORAL | Status: DC
  Administered 2020-05-05 – 2020-05-07 (×3): 10 mg via ORAL
  Filled 2020-05-05 (×3): qty 1

## 2020-05-05 NOTE — Progress Notes (Signed)
PROGRESS NOTE    Jordan Ward  NTI:[id number] DOB: [birth year] DOA: 05/02/2020 PCP: [name], MD    Chief Complaint  Patient presents with  . urinary retention    Brief Narrative:  HPI per Dr. [name] is a 79 y.o. male with history of multiple sclerosis with suprapubic catheter which was recently changed 2 weeks ago with history of hypothyroidism sleep apnea recent admission in June for CBD stone and had undergone ERCP with sphincterotomy has been noticing decreasing urine output since yesterday afternoon with some flank pain.  Had chills and rigors.  Denies nausea vomiting.  ED Course: In the ER at [facility] patient was found to have a fever of 101.1 and CT renal study shows acute left-sided hydroureteronephrosis with ureteral stone.  On-call urologist Dr. Alyson Ingles was consulted by the ER physician who requested nephrostomy tube placement urgently in the setting of possible developing sepsis and Dr. Earleen Newport interventional radiology was consulted and patient was transferred to [facility]  Patient had nephrostomy tube placed and is being admitted for further management.  Patient had blood cultures drawn empiric antibiotic started.  Labs are remarkable for creatinine of 1.8 which increased from baseline of 1.3 WBC count of 13.2 lactic acid was normal EKG shows normal sinus rhythm.  Assessment & Plan:   Principal Problem:   Hydronephrosis due to obstruction of ureter Active Problems:   Bacteremia due to Gram-negative bacteria   Multiple sclerosis (HCC)   Chronic suprapubic catheter (HCC)   Pulmonary fibrosis (HCC)   Tremors of nervous system   Hypothyroidism   Hydronephrosis with obstructing calculus   CKD (chronic kidney disease), stage III   Hydronephrosis of left kidney   Pressure injury of skin   Sepsis secondary to UTI ([place])   Complicated UTI (urinary tract infection)   Acute kidney injury superimposed on CKD (HCC)   Dehydration   Acute  lower UTI   Acute unilateral obstructive uropathy   1 acute left-sided hydroureteronephrosis with ureteral stone in the setting of possible early sepsis from UTI and Proteus bacteremia. Patient seen in consultation by urology and also by IR and underwent left-sided nephrostomy tube placement by Dr. Earleen Newport IR.  Blood cultures with Proteus Mirabella's.  Urine cultures with > 100,000 colonies of Proteus mirabilis.  Patient still with urethral stone.  Afebrile.  Improving clinically.  Urine output of 2.950 L over the past 24 hours.  Creatinine trending down.  Patient initially was on IV cefepime and IV vancomycin.  IV vancomycin has been discontinued.  IV cefepime has been narrowed to IV Rocephin.  Will likely need oral antibiotics on discharge to complete a 2-week course of antibiotic treatment.  Outpatient follow-up with urology for further stone management.  Urology following and appreciate input and recommendations.  2.  Early sepsis secondary to Proteus bacteremia and Proteus UTI.  Patient had presented on admission with a leukocytosis, urinalysis worrisome for UTI, acute renal failure, fever with temp of 101.1.  Patient pancultured with blood cultures positive for Proteus bacteremia likely seeded from the urine as urine cultures are positive for Proteus UTI.  Patient was on IV vancomycin IV cefepime.  IV vancomycin discontinued.  IV cefepime narrowed to IV Rocephin.  Will likely need oral antibiotics on discharge to complete a 2-week course of antibiotic treatment.  Follow.   3.  Hypokalemia K. Dur 40 mEq p.o. every 4 hours x2 doses.  4.  Acute renal failure in the solitary functioning kidney on chronic  kidney disease stage IIIa Likely secondary to problem #1 and volume depletion..  Status post urostomy tube placement.  Good urine output of 2.950 L over the past 24 hours.  Creatinine trending down.  Saline lock IV fluids.   5.  Hypernatremia Secondary to dehydration.  Improved with half-normal  saline.  Saline lock IV fluids.  6.  Hypothyroidism Synthroid.    7.  Obstructive sleep apnea CPAP nightly.  8.  History of tremors  9.  History of pulmonary fibrosis Saline lock IV fluids.    10.  History of multiple sclerosis   11.  Chronic pain Continue current pain regimen.   12.  Neurogenic bladder Status post suprapubic catheter.  Urine output improving after nephrostomy tubes placed.  Per urology.   13.  Pressure injury Pressure Ulcer 08/03/15 Stage I -  Intact skin with non-blanchable redness of a localized area usually over a bony prominence. Tip of left great toe red (Active)  08/03/15 1425  Location: Toe (Comment  which one)  Location Orientation: Left (left great toe)  Staging: Stage I -  Intact skin with non-blanchable redness of a localized area usually over a bony prominence.  Wound Description (Comments): Tip of left great toe red  Present on Admission:      Pressure Injury 05/26/17 Stage I -  Intact skin with non-blanchable redness of a localized area usually over a bony prominence.  calleous and non-blanchable redness to ankle (Active)  05/26/17 0157  Location: Ankle  Location Orientation: Right  Staging: Stage I -  Intact skin with non-blanchable redness of a localized area usually over a bony prominence.  Wound Description (Comments):  calleous and non-blanchable redness to ankle  Present on Admission: Yes     Pressure Injury 05/03/20 Sacrum Mid Stage 1 -  Intact skin with non-blanchable redness of a localized area usually over a bony prominence. (Active)  05/03/20 1800  Location: Sacrum  Location Orientation: Mid  Staging: Stage 1 -  Intact skin with non-blanchable redness of a localized area usually over a bony prominence.  Wound Description (Comments):   Present on Admission: Yes        DVT prophylaxis: SCDs Code Status: Full Family Communication: Updated patient.  No family at bedside. Disposition:   Status is: Inpatient    Dispo:  The patient is from: Home              Anticipated d/c is to: Home              Anticipated d/c date is: Hopefully tomorrow.  05/06/2020              Patient currently with a bacteremia, on IV antibiotics, also in acute renal failure.  Not stable for discharge.       Consultants:   IR: Dr. Earleen Newport 05/02/2020  Urology: Dr. Alinda Money 05/03/2020  Procedures:   CT renal stone protocol 05/02/2020  Chest x-ray 05/02/2020  Left nephrostomy tube placement per IR: Dr. Earleen Newport 05/02/2020  Antimicrobials:   IV cefepime 05/02/2020>>> 05/04/2020  IV Flagyl 05/03/2019 21x1 dose  IV vancomycin 05/02/2020>>> 05/03/2020  IV Rocephin 05/04/2020   Subjective: Patient sitting up in bed.  Still with some complaints around nephrostomy site.  Denies chest pain or shortness of breath.  Denies any abdominal pain.  Overall feeling better.   Objective: Vitals:   05/04/20 1226 05/04/20 2013 05/05/20 0501 05/05/20 1143  BP: (!) 148/75 (!) 164/70 (!) 164/70 140/66  Pulse: (!) 58 61 (!) 52 (!) 47  Resp: 20 18 18 20   Temp: 98.9 F (37.2 C) 99.8 F (37.7 C) 97.6 F (36.4 C) 98.4 F (36.9 C)  TempSrc: Oral Oral Oral Oral  SpO2: 97% 95% 98% 95%  Weight:   52.8 kg   Height:        Intake/Output Summary (Last 24 hours) at 05/05/2020 1214 Last data filed at 05/05/2020 0915 Gross per 24 hour  Intake 1762.22 ml  Output 1950 ml  Net -187.78 ml   Filed Weights   05/02/20 1644 05/04/20 0522 05/05/20 0501  Weight: 51 kg 52.6 kg 52.8 kg    Examination:  General exam: NAD Respiratory system: CTAB.  No wheezes, no crackles, no rhonchi. Cardiovascular system: Regular rate rhythm no murmurs rubs or gallops.  No JVD.  No lower extremity edema.  Gastrointestinal system: Abdomen is soft, nontender, nondistended, positive bowel sounds. Some tenderness around nephrostomy site.  Positive bowel sounds.  No rebound.  No guarding.  Central nervous system: Alert and oriented. No focal neurological deficits. Extremities: Symmetric 5 x  5 power. Skin: No rashes, lesions or ulcers Psychiatry: Judgement and insight appear normal. Mood & affect appropriate.     Data Reviewed: I have personally reviewed following labs and imaging studies  CBC: Recent Labs  Lab 05/02/20 1718 05/03/20 0500 05/04/20 0504 05/05/20 0544  WBC 13.2* 18.8* 12.2* 9.0  NEUTROABS 10.9* 16.1* 9.0* 5.7  HGB 13.3 12.8* 11.2* 11.3*  HCT 42.1 39.7 35.9* 35.2*  MCV 98.6 98.3 100.3* 98.6  PLT 157 160 99* 100*    Basic Metabolic Panel: Recent Labs  Lab 05/02/20 1718 05/03/20 0500 05/03/20 1353 05/04/20 0504 05/05/20 0544  NA 139 139 147* 144 140  K 3.4* 6.1* 3.1* 2.9* 3.0*  CL 104 108 109 109 105  CO2 25 24 26 25 24   GLUCOSE 162* 127* 140* 92 118*  BUN 33* 30* 30* 30* 24*  CREATININE 1.89* 2.61* 2.17* 2.09* 1.54*  CALCIUM 8.9 8.1* 8.6* 8.2* 8.3*  PHOS  --   --  2.5  --  2.3*    GFR: Estimated Creatinine Clearance: 30 mL/min (A) (by C-G formula based on SCr of 1.54 mg/dL (H)).  Liver Function Tests: Recent Labs  Lab 05/02/20 1718 05/03/20 0500 05/03/20 1353 05/05/20 0544  AST 18 54*  --   --   ALT 13 20  --   --   ALKPHOS 93 70  --   --   BILITOT 0.6 2.4*  --   --   PROT 7.7 5.6*  --   --   ALBUMIN 4.4 3.4* 3.6 3.0*    CBG: No results for input(s): GLUCAP in the last 168 hours.   Recent Results (from the past 240 hour(s))  Blood culture (routine single)     Status: None (Preliminary result)   Collection Time: 05/02/20  5:18 PM   Specimen: BLOOD LEFT FOREARM  Result Value Ref Range Status   Specimen Description BLOOD LEFT FOREARM  Final   Special Requests   Final    BOTTLES DRAWN AEROBIC AND ANAEROBIC Blood Culture adequate volume   Culture   Final    NO GROWTH 3 DAYS Performed at [facility], [address] [place], [place] [address]    Report Status PENDING  Incomplete  SARS Coronavirus 2 by RT PCR (hospital order, performed in [place] hospital lab) Nasopharyngeal Nasopharyngeal Swab     Status: None    Collection Time: 05/02/20  5:51 PM   Specimen: Nasopharyngeal Swab  Result Value  Ref Range Status   SARS Coronavirus 2 NEGATIVE NEGATIVE Final    Comment: (NOTE) SARS-CoV-2 target nucleic acids are NOT DETECTED.  The SARS-CoV-2 RNA is generally detectable in upper and lower respiratory specimens during the acute phase of infection. The lowest concentration of SARS-CoV-2 viral copies this assay can detect is 250 copies / mL. A negative result does not preclude SARS-CoV-2 infection and should not be used as the sole basis for treatment or other patient management decisions.  A negative result may occur with improper specimen collection / handling, submission of specimen other than nasopharyngeal swab, presence of viral mutation(s) within the areas targeted by this assay, and inadequate number of viral copies (<250 copies / mL). A negative result must be combined with clinical observations, patient history, and epidemiological information.  Fact Sheet for Patients:   StrictlyIdeas.no  Fact Sheet for Healthcare Providers: BankingDealers.co.za  This test is not yet approved or  cleared by the [place] FDA and has been authorized for detection and/or diagnosis of SARS-CoV-2 by FDA under an Emergency Use Authorization (EUA).  This EUA will remain in effect (meaning this test can be used) for the duration of the COVID-19 declaration under Section 564(b)(1) of the Act, 21 U.S.C. section 360bbb-3(b)(1), unless the authorization is terminated or revoked sooner.  Performed at [facility], [address], [place], [place] [address]   Culture, blood (single)     Status: Abnormal   Collection Time: 05/02/20  6:11 PM   Specimen: BLOOD RIGHT HAND  Result Value Ref Range Status   Specimen Description   Final    BLOOD RIGHT HAND Performed at [facility], [address] [place], [place] [address]    Special Requests   Final    BOTTLES DRAWN  AEROBIC AND ANAEROBIC Blood Culture adequate volume Performed at [facility], [address], [place], [place] [address]    Culture  Setup Time   Final    GRAM NEGATIVE RODS ANAEROBIC BOTTLE ONLY Gram Stain Report Called to,Read Back By and Verified With: GARRISON G. AT [id number] ON [id number] BY [place] Paiz S. Organism ID to follow CRITICAL RESULT CALLED TO, READ BACK BY AND VERIFIED WITH: S CHRISTY PHARMD 1607 05/03/20 A BROWNING AEROBIC BOTTLE ALSO PREVIOUSLY CALLED Performed at [facility], [address], [place],  [address]    Culture PROTEUS MIRABILIS (A)  Final   Report Status 05/05/2020 FINAL  Final   Organism ID, Bacteria PROTEUS MIRABILIS  Final      Susceptibility   Proteus mirabilis - MIC*    AMPICILLIN RESISTANT Resistant     CEFAZOLIN 8 SENSITIVE Sensitive     CEFEPIME <=0.12 SENSITIVE Sensitive     CEFTAZIDIME <=1 SENSITIVE Sensitive     CEFTRIAXONE <=0.25 SENSITIVE Sensitive     CIPROFLOXACIN <=0.25 SENSITIVE Sensitive     GENTAMICIN <=1 SENSITIVE Sensitive     IMIPENEM 4 SENSITIVE Sensitive     TRIMETH/SULFA <=20 SENSITIVE Sensitive     AMPICILLIN/SULBACTAM 16 INTERMEDIATE Intermediate     PIP/TAZO <=4 SENSITIVE Sensitive     * PROTEUS MIRABILIS  Blood Culture ID Panel (Reflexed)     Status: Abnormal   Collection Time: 05/02/20  6:11 PM  Result Value Ref Range Status   Enterococcus faecalis NOT DETECTED NOT DETECTED Final   Enterococcus Faecium NOT DETECTED NOT DETECTED Final   Listeria monocytogenes NOT DETECTED NOT DETECTED Final   Staphylococcus species NOT DETECTED NOT DETECTED Final   Staphylococcus aureus (BCID) NOT DETECTED NOT DETECTED Final  Staphylococcus epidermidis NOT DETECTED NOT DETECTED Final   Staphylococcus lugdunensis NOT DETECTED NOT DETECTED Final   Streptococcus species NOT DETECTED NOT DETECTED Final   Streptococcus agalactiae NOT DETECTED NOT DETECTED Final   Streptococcus pneumoniae NOT DETECTED NOT DETECTED Final   Streptococcus  pyogenes NOT DETECTED NOT DETECTED Final   A.calcoaceticus-baumannii NOT DETECTED NOT DETECTED Final   Bacteroides fragilis NOT DETECTED NOT DETECTED Final   Enterobacterales DETECTED (A) NOT DETECTED Final    Comment: Enterobacterales represent a large order of gram negative bacteria, not a single organism. CRITICAL RESULT CALLED TO, READ BACK BY AND VERIFIED WITH: S CHRISTY PHARMD 1607 05/03/20 A BROWNING    Enterobacter cloacae complex NOT DETECTED NOT DETECTED Final   Escherichia coli NOT DETECTED NOT DETECTED Final   Klebsiella aerogenes NOT DETECTED NOT DETECTED Final   Klebsiella oxytoca NOT DETECTED NOT DETECTED Final   Klebsiella pneumoniae NOT DETECTED NOT DETECTED Final   Proteus species DETECTED (A) NOT DETECTED Final    Comment: CRITICAL RESULT CALLED TO, READ BACK BY AND VERIFIED WITH: S CHRISTY PHARMD 1607 05/03/20 A BROWNING    Salmonella species NOT DETECTED NOT DETECTED Final   Serratia marcescens NOT DETECTED NOT DETECTED Final   Haemophilus influenzae NOT DETECTED NOT DETECTED Final   Neisseria meningitidis NOT DETECTED NOT DETECTED Final   Pseudomonas aeruginosa NOT DETECTED NOT DETECTED Final   Stenotrophomonas maltophilia NOT DETECTED NOT DETECTED Final   Candida albicans NOT DETECTED NOT DETECTED Final   Candida auris NOT DETECTED NOT DETECTED Final   Candida glabrata NOT DETECTED NOT DETECTED Final   Candida krusei NOT DETECTED NOT DETECTED Final   Candida parapsilosis NOT DETECTED NOT DETECTED Final   Candida tropicalis NOT DETECTED NOT DETECTED Final   Cryptococcus neoformans/gattii NOT DETECTED NOT DETECTED Final   CTX-M ESBL NOT DETECTED NOT DETECTED Final   Carbapenem resistance IMP NOT DETECTED NOT DETECTED Final   Carbapenem resistance KPC NOT DETECTED NOT DETECTED Final   Carbapenem resistance NDM NOT DETECTED NOT DETECTED Final   Carbapenem resist OXA 48 LIKE NOT DETECTED NOT DETECTED Final   Carbapenem resistance VIM NOT DETECTED NOT DETECTED Final     Comment: Performed at [facility], [address] [place], [place] [address]  Aerobic/Anaerobic Culture (surgical/deep wound)     Status: None (Preliminary result)   Collection Time: 05/03/20 12:08 AM   Specimen: Kidney; Urine  Result Value Ref Range Status   Specimen Description   Final    KIDNEY Performed at [place] [address], [place], [place] [address]    Special Requests   Final    NONE Performed at [facility], [place] [address] [place], [place] [address]    Gram Stain   Final    ABUNDANT WBC PRESENT, PREDOMINANTLY PMN RARE GRAM NEGATIVE RODS    Culture   Final    MODERATE PROTEUS MIRABILIS SUSCEPTIBILITIES TO FOLLOW Performed at [facility], [place] [address], [place], [place] [address]    Report Status PENDING  Incomplete  Culture, Urine     Status: Abnormal   Collection Time: 05/03/20  9:33 AM   Specimen: Urine, Catheterized  Result Value Ref Range Status   Specimen Description   Final    URINE, CATHETERIZED Performed at [place] [address] [place], [place] [address]    Special Requests   Final    NONE Performed at [facility], [address] [place], [place]  [address]    Culture >=100,000 COLONIES/mL PROTEUS MIRABILIS (A)  Final   Report Status 05/05/2020 FINAL  Final   Organism ID, Bacteria PROTEUS MIRABILIS (A)  Final      Susceptibility   Proteus mirabilis - MIC*    AMPICILLIN <=2 SENSITIVE Sensitive     CEFAZOLIN <=4 SENSITIVE Sensitive     CEFTRIAXONE <=0.25 SENSITIVE Sensitive     CIPROFLOXACIN <=0.25 SENSITIVE Sensitive     GENTAMICIN <=1 SENSITIVE Sensitive     IMIPENEM 1 SENSITIVE Sensitive     NITROFURANTOIN 128 RESISTANT Resistant     TRIMETH/SULFA <=20 SENSITIVE Sensitive     AMPICILLIN/SULBACTAM <=2 SENSITIVE Sensitive     PIP/TAZO <=4 SENSITIVE Sensitive     * >=100,000 COLONIES/mL PROTEUS MIRABILIS         Radiology  Studies: No results found.      Scheduled Meds: . amLODipine  10 mg Oral Daily  . carvedilol  6.25 mg Oral BID WC  . levothyroxine  75 mcg Oral Q0600  . potassium chloride  40 mEq Oral Q4H   Continuous Infusions: . cefTRIAXone (ROCEPHIN)  IV 2 g (05/04/20 1935)     LOS: 2 days    Time spent: 40 minutes    Irine Seal, MD [facility]   To contact the attending provider between 7A-7P or the covering provider during after hours 7P-7A, please log into the web site [email or web address] and access using universal [place] password for that web site. If you do not have the password, please call the hospital operator.  05/05/2020, 12:14 PM

## 2020-05-05 NOTE — Progress Notes (Signed)
Physical Therapy Treatment Patient Details Name: Jordan Ward MRN: [id number] DOB: [birth year] Today's Date: 05/05/2020    History of Present Illness 79 y.o. male with history of multiple sclerosis with suprapubic catheter which was recently changed 2 weeks ago with history of hypothyroidism sleep apnea recent admission in June for CBD stone and had undergone ERCP with sphincterotomy. pt  with decreasing urine output and some flank pain. CT renal study shows acute left-sided hydroureteronephrosis with ureteral stone    PT Comments    Pt progressing well.  Motivated to mobilize. Will continue to follow in acute setting.     Follow Up Recommendations  No PT follow up     Equipment Recommendations  None recommended by PT    Recommendations for Other Services       Precautions / Restrictions Precautions Precautions: Fall Precaution Comments: L nephrostomy tube, suprapubic catheter Restrictions Weight Bearing Restrictions: No    Mobility  Bed Mobility   Bed Mobility: Sit to Supine     Supine to sit: Supervision (supervision for line management. HOB partially elevated.) Sit to supine: Min guard;Min assist   General bed mobility comments: assist with LEs  Transfers Overall transfer level: Needs assistance Equipment used: Rolling walker (2 wheeled) Transfers: Sit to/from Stand Sit to Stand: Supervision Stand pivot transfers: Supervision       General transfer comment: cues for hand placement, unsteady on standing when transitioning to RW   Ambulation/Gait Ambulation/Gait assistance: Min guard;Supervision Gait Distance (Feet): 160 Feet Assistive device: Rolling walker (2 wheeled) Gait Pattern/deviations: Step-to pattern;Decreased stride length;Narrow base of support;Drifts right/left     General Gait Details: overall unsteady gait but without overt LOB. min/guard for balance and safety   Stairs             Wheelchair Mobility    Modified Rankin (Stroke  Patients Only)       Balance Overall balance assessment: Mild deficits observed, not formally tested                                          Cognition Arousal/Alertness: Awake/alert Behavior During Therapy: WFL for tasks assessed/performed Overall Cognitive Status: Within Functional Limits for tasks assessed                                 General Comments: Oriented to person, place, situation and year.  Stated month as September.      Exercises      General Comments General comments (skin integrity, edema, etc.): Pt sat without UE support and no LOB.  Standing pt able to statically stand without UE support.  With BUE on RW, pt able to perform tandum ambulation forward and backwards x5 steps each.  Also performed norrmal gait  anteriorly and posteriorly ~5 steps each way with RW Mod I.      Pertinent Vitals/Pain Pain Assessment: No/denies pain    Home Living Family/patient expects to be discharged to:: Private residence Living Arrangements: Spouse/significant other;Children (Adult daughter and wife) Available Help at Discharge: Family;Available 24 hours/day Type of Home: House Home Access: Stairs to enter Entrance Stairs-Rails: Right Home Layout: One level Home Equipment: Walker - 2 wheels;Cane - single point;Grab bars - toilet;Crutches;Adaptive equipment Additional Comments: forearm crutch    Prior Function Level of Independence: Independent with assistive device(s);Independent  Comments: household independent amb per pt. amb with RW when going out.  Wife performs majority of housework and provides transportation as pt no longer has his license.   PT Goals (current goals can now be found in the care plan section) Acute Rehab PT Goals Patient Stated Goal: home soon PT Goal Formulation: With patient Time For Goal Achievement: 05/11/20 Potential to Achieve Goals: Good Progress towards PT goals: Progressing toward goals     Frequency    Min 3X/week      PT Plan Current plan remains appropriate    Co-evaluation              AM-PAC PT "6 Clicks" Mobility   Outcome Measure  Help needed turning from your back to your side while in a flat bed without using bedrails?: A Little Help needed moving from lying on your back to sitting on the side of a flat bed without using bedrails?: A Little Help needed moving to and from a bed to a chair (including a wheelchair)?: A Little Help needed standing up from a chair using your arms (e.g., wheelchair or bedside chair)?: A Little Help needed to walk in hospital room?: A Little Help needed climbing 3-5 steps with a railing? : A Little 6 Click Score: 18    End of Session Equipment Utilized During Treatment: Gait belt Activity Tolerance: Patient tolerated treatment well Patient left: with call bell/phone within reach;in bed;with bed alarm set Nurse Communication: Mobility status PT Visit Diagnosis: Difficulty in walking, not elsewhere classified (R26.2);Other abnormalities of gait and mobility (R26.89)     Time: 7616-0737 PT Time Calculation (min) (ACUTE ONLY): 13 min  Charges:  $Gait Training: 8-22 mins                     Baxter Flattery, PT  Acute Rehab Dept ([place]) [phone] Pager [phone]  05/05/2020    [facility] 05/05/2020, 4:31 PM

## 2020-05-05 NOTE — Evaluation (Signed)
Occupational Therapy Evaluation Patient Details Name: Jordan Ward MRN: [id number] DOB: 05/11/1941 Today's Date: 05/05/2020    History of Present Illness 79 y.o. male with history of multiple sclerosis with suprapubic catheter which was recently changed 2 weeks ago with history of hypothyroidism sleep apnea recent admission in June for CBD stone and had undergone ERCP with sphincterotomy. pt  with decreasing urine output and some flank pain. CT renal study shows acute left-sided hydroureteronephrosis with ureteral stone   Clinical Impression   Patient evaluated by Occupational Therapy with no further acute OT needs identified. All education has been completed and the patient has no further questions. Pt appears to be performing his ADLs and functional bathroom mobility at baseline.  Will defer to PT for f/u gait training while in hospital.  See below for any follow-up Occupational Therapy or equipment needs. OT is signing off. Thank you for this referral.      Follow Up Recommendations  No OT follow up    Equipment Recommendations       Recommendations for Other Services       Precautions / Restrictions Precautions Precautions: Fall Precaution Comments: Lt lower abdominal drain, suprapubic catheter Restrictions Weight Bearing Restrictions: No      Mobility Bed Mobility   Bed Mobility: Supine to Sit     Supine to sit: Supervision (supervision for line management. HOB partially elevated.)        Transfers Overall transfer level: Modified independent Equipment used: Rolling walker (2 wheeled) Transfers: Sit to/from [facility] Sit to Stand: Supervision Stand pivot transfers: Supervision            Balance Overall balance assessment: Mild deficits observed, not formally tested                                         ADL either performed or assessed with clinical judgement   ADL Overall ADL's : Modified independent;At baseline                                        General ADL Comments: Recommended supervision for safety during sink bathes at first.  Pt agreed.  Pt uses adaptive equipment independently at home as he learned all methods during a previous SNF/rehab stay.     Vision   Vision Assessment?: No apparent visual deficits     Perception     Praxis      Pertinent Vitals/Pain Pain Assessment: No/denies pain     Hand Dominance Left   Extremity/Trunk Assessment Upper Extremity Assessment Upper Extremity Assessment: Overall WFL for tasks assessed   Lower Extremity Assessment Lower Extremity Assessment: Defer to PT evaluation   Cervical / Trunk Assessment Cervical / Trunk Assessment: Kyphotic   Communication Communication Communication: No difficulties   Cognition Arousal/Alertness: Awake/alert Behavior During Therapy: WFL for tasks assessed/performed Overall Cognitive Status: Within Functional Limits for tasks assessed                                 General Comments: Oriented to person, place, situation and year.  Stated month as September.   General Comments  Pt sat without UE support and no LOB.  Standing pt able to statically stand without UE support.  With BUE  on RW, pt able to perform tandum ambulation forward and backwards x5 steps each.  Also performed norrmal gait  anteriorly and posteriorly ~5 steps each way with RW Mod I.    Exercises     Shoulder Instructions      Home Living Family/patient expects to be discharged to:: Private residence Living Arrangements: Spouse/significant other;Children (Adult daughter and wife) Available Help at Discharge: Family;Available 24 hours/day Type of Home: House Home Access: Stairs to enter [employer] of Steps: 2 Entrance Stairs-Rails: Right Home Layout: One level     Bathroom Shower/Tub: Tub/shower unit (Pt does not use shower-bathes at sink)   Biochemist, clinical: Standard     Home Equipment:  Environmental consultant - 2 wheels;Cane - single point;Grab bars - toilet;Crutches;Adaptive equipment Adaptive Equipment: Reacher;Sock aid;Long-handled shoe horn;Long-handled sponge Additional Comments: forearm crutch      Prior Functioning/Environment Level of Independence: Independent with assistive device(s);Independent        Comments: household independent amb per pt. amb with RW when going out.  Wife performs majority of housework and provides transportation as pt no longer has his license.        OT Problem List:        OT Treatment/Interventions:      OT Goals(Current goals can be found in the care plan section) Acute Rehab OT Goals Patient Stated Goal: home soon OT Goal Formulation: With patient Time For Goal Achievement: 05/05/20 Potential to Achieve Goals: Good  OT Frequency:     Barriers to D/C:  None known                        AM-PAC OT "6 Clicks" Daily Activity     Outcome Measure Help from another person eating meals?: None Help from another person taking care of personal grooming?: None Help from another person toileting, which includes using toliet, bedpan, or urinal?: None Help from another person bathing (including washing, rinsing, drying)?: A Little Help from another person to put on and taking off regular upper body clothing?: None Help from another person to put on and taking off regular lower body clothing?: None 6 Click Score: 23   End of Session Equipment Utilized During Treatment: Gait belt;Rolling walker Nurse Communication: Mobility status  Activity Tolerance: Patient tolerated treatment well Patient left: in chair;with chair alarm set;with call bell/phone within reach  OT Visit Diagnosis: Unsteadiness on feet (R26.81)                Time: 6433-2951 OT Time Calculation (min): 25 min Charges:  OT General Charges $OT Visit: 1 Visit OT Evaluation $OT Eval Low Complexity: 1 Low  Kjell Brannen, [place] Office:  [phone] 05/05/2020   Julien Girt 05/05/2020, 2:35 PM

## 2020-05-05 NOTE — Progress Notes (Signed)
Pt refuses CPAP QHS, RT to monitor and assess as needed.  

## 2020-05-06 DIAGNOSIS — L8991 Pressure ulcer of unspecified site, stage 1: Secondary | ICD-10-CM

## 2020-05-06 LAB — RENAL FUNCTION PANEL
Albumin: 3.4 g/dL — ABNORMAL LOW (ref 3.5–5.0)
Anion gap: 10 (ref 5–15)
BUN: 20 mg/dL (ref 8–23)
CO2: 26 mmol/L (ref 22–32)
Calcium: 8.6 mg/dL — ABNORMAL LOW (ref 8.9–10.3)
Chloride: 104 mmol/L (ref 98–111)
Creatinine, Ser: 1.37 mg/dL — ABNORMAL HIGH (ref 0.61–1.24)
GFR calc Af Amer: 57 mL/min — ABNORMAL LOW (ref >60)
GFR calc non Af Amer: 49 mL/min — ABNORMAL LOW (ref >60)
Glucose, Bld: 101 mg/dL — ABNORMAL HIGH (ref 70–99)
Phosphorus: 2.3 mg/dL — ABNORMAL LOW (ref 2.5–4.6)
Potassium: 3.9 mmol/L (ref 3.5–5.1)
Sodium: 140 mmol/L (ref 135–145)

## 2020-05-06 LAB — CBC
HCT: 39.6 % (ref 39.0–52.0)
Hemoglobin: 12.6 g/dL — ABNORMAL LOW (ref 13.0–17.0)
MCH: 31.3 pg (ref 26.0–34.0)
MCHC: 31.8 g/dL (ref 30.0–36.0)
MCV: 98.3 fL (ref 80.0–100.0)
Platelets: 120 10*3/uL — ABNORMAL LOW (ref 150–400)
RBC: 4.03 MIL/uL — ABNORMAL LOW (ref 4.22–5.81)
RDW: 13.1 % (ref 11.5–15.5)
WBC: 6.3 10*3/uL (ref 4.0–10.5)
nRBC: 0 % (ref 0.0–0.2)

## 2020-05-06 LAB — CULTURE, BLOOD (SINGLE): Special Requests: ADEQUATE

## 2020-05-06 MED ORDER — CEPHALEXIN 500 MG PO CAPS
500.0000 mg | ORAL_CAPSULE | Freq: Four times a day (QID) | ORAL | Status: DC
  Administered 2020-05-06 – 2020-05-07 (×3): 500 mg via ORAL
  Filled 2020-05-06 (×3): qty 1

## 2020-05-06 MED ORDER — CEPHALEXIN 500 MG PO CAPS
500.0000 mg | ORAL_CAPSULE | Freq: Four times a day (QID) | ORAL | Status: DC
  Administered 2020-05-06: 500 mg via ORAL
  Filled 2020-05-06: qty 1

## 2020-05-06 MED ORDER — OXYBUTYNIN CHLORIDE 5 MG PO TABS: 5.0000 mg | ORAL_TABLET | Freq: Three times a day (TID) | ORAL | Status: DC | PRN

## 2020-05-06 NOTE — Progress Notes (Signed)
PROGRESS NOTE    Jordan Ward  VOH:[id number] DOB: 24-Dec-1940 DOA: 05/02/2020 PCP: [name], MD    Chief Complaint  Patient presents with  . urinary retention    Brief Narrative:  HPI per Dr. [name] is a 79 y.o. male with history of multiple sclerosis with suprapubic catheter which was recently changed 2 weeks ago with history of hypothyroidism sleep apnea recent admission in June for CBD stone and had undergone ERCP with sphincterotomy has been noticing decreasing urine output since yesterday afternoon with some flank pain.  Had chills and rigors.  Denies nausea vomiting.  ED Course: In the ER at [facility] patient was found to have a fever of 101.1 and CT renal study shows acute left-sided hydroureteronephrosis with ureteral stone.  On-call urologist Dr. Alyson Ingles was consulted by the ER physician who requested nephrostomy tube placement urgently in the setting of possible developing sepsis and Dr. Earleen Newport interventional radiology was consulted and patient was transferred to [facility].  Patient had nephrostomy tube placed and is being admitted for further management.  Patient had blood cultures drawn empiric antibiotic started.  Labs are remarkable for creatinine of 1.8 which increased from baseline of 1.3 WBC count of 13.2 lactic acid was normal EKG shows normal sinus rhythm.  Assessment & Plan:   Principal Problem:   Hydronephrosis due to obstruction of ureter Active Problems:   Bacteremia due to Gram-negative bacteria   Multiple sclerosis (HCC)   Chronic suprapubic catheter (HCC)   Pulmonary fibrosis (HCC)   Tremors of nervous system   Hypothyroidism   Hydronephrosis with obstructing calculus   CKD (chronic kidney disease), stage III   Hydronephrosis of left kidney   Pressure injury of skin   Sepsis secondary to UTI ([place])   Complicated UTI (urinary tract infection)   Acute kidney injury superimposed on CKD (HCC)   Dehydration   Acute  lower UTI   Acute unilateral obstructive uropathy   1 acute left-sided hydroureteronephrosis with ureteral stone in the setting of possible early sepsis from UTI and Proteus bacteremia. Patient seen in consultation by urology and also by IR and underwent left-sided nephrostomy tube placement by Dr. Earleen Newport IR.  Blood cultures with Proteus Mirabella's.  Urine cultures with > 100,000 colonies of Proteus mirabilis.  Patient still with urethral stone.  Afebrile.  Improving clinically.  Urine output of 3.350 L over the past 24 hours.  Creatinine trending down.  Patient initially was on IV cefepime and IV vancomycin.  IV vancomycin has been discontinued.  IV cefepime has been narrowed to IV Rocephin.  We will transition from IV Rocephin to oral Keflex 500 mg p.o. 4 times daily x14 more days.  Antibiotic regimen discussed with ID.  Outpatient follow-up with urology for further stone management and nephrostomy tubes. Urology following and appreciate input and recommendations.  2.  Early sepsis secondary to Proteus bacteremia and Proteus UTI.  Patient had presented on admission with a leukocytosis, urinalysis worrisome for UTI, acute renal failure, fever with temp of 101.1.  Patient pancultured with blood cultures positive for Proteus bacteremia likely seeded from the urine as urine cultures are positive for Proteus UTI.  Patient was on IV vancomycin IV cefepime.  IV vancomycin discontinued.  IV cefepime narrowed to IV Rocephin.  We will transition from IV Rocephin to oral Keflex 500 mg 4 times daily for another 14 days as patient with nephrolithiasis.  Outpatient follow-up with urology.   3.  Hypokalemia Repleted.  Potassium at 3.9.    4.  Acute renal failure in the solitary functioning kidney on chronic kidney disease stage IIIa Likely secondary to problem #1 and volume depletion..  Status post urostomy tube placement.  Good urine output of 3.350 L over the past 24 hours.  Renal function improving.  Follow.     5.  Hypernatremia Secondary to dehydration.  Improved with half-normal saline.  IV fluids saline lock.  Sodium at 140.   6.  Hypothyroidism Continue Synthroid.    7.  Obstructive sleep apnea CPAP nightly.  8.  History of tremors  9.  History of pulmonary fibrosis Stable.  IV fluids have been saline lock.  Saline lock IV fluids.    10.  History of multiple sclerosis   11.  Chronic pain Continue current pain regimen.   12.  Neurogenic bladder Status post suprapubic catheter.  Urine output improved after nephrostomy tube placement.  Urine output of 3.350 L over the past 24 hours with urostomy output of 1.8 L.  Place on Ditropan as needed bladder spasms.  Per urology.  13.  Hypertension Continue current dose of Norvasc 10 mg daily, Coreg 6.25 mg twice daily.  Follow.  14.  Pressure injury Pressure Ulcer 08/03/15 Stage I -  Intact skin with non-blanchable redness of a localized area usually over a bony prominence. Tip of left great toe red (Active)  08/03/15 1425  Location: Toe (Comment  which one)  Location Orientation: Left (left great toe)  Staging: Stage I -  Intact skin with non-blanchable redness of a localized area usually over a bony prominence.  Wound Description (Comments): Tip of left great toe red  Present on Admission:      Pressure Injury 05/26/17 Stage I -  Intact skin with non-blanchable redness of a localized area usually over a bony prominence.  calleous and non-blanchable redness to ankle (Active)  05/26/17 0157  Location: Ankle  Location Orientation: Right  Staging: Stage I -  Intact skin with non-blanchable redness of a localized area usually over a bony prominence.  Wound Description (Comments):  calleous and non-blanchable redness to ankle  Present on Admission: Yes     Pressure Injury 05/03/20 Sacrum Mid Stage 1 -  Intact skin with non-blanchable redness of a localized area usually over a bony prominence. (Active)  05/03/20 1800  Location: Sacrum   Location Orientation: Mid  Staging: Stage 1 -  Intact skin with non-blanchable redness of a localized area usually over a bony prominence.  Wound Description (Comments):   Present on Admission: Yes        DVT prophylaxis: SCDs Code Status: Full Family Communication: Updated patient.  No family at bedside. Disposition:   Status is: Inpatient    Dispo: The patient is from: Home              Anticipated d/c is to: Home              Anticipated d/c date is: Hopefully tomorrow.  05/07/2020              Patient currently with a bacteremia, on IV antibiotics and being transitioned to oral antibiotics.  Not stable for discharge.       Consultants:   IR: Dr. Earleen Newport 05/02/2020  Urology: Dr. Alinda Money 05/03/2020  Procedures:   CT renal stone protocol 05/02/2020  Chest x-ray 05/02/2020  Left nephrostomy tube placement per IR: Dr. Earleen Newport 05/02/2020  Antimicrobials:   IV cefepime 05/02/2020>>> 05/04/2020  IV Flagyl 05/03/2019 21x1  dose  IV vancomycin 05/02/2020>>> 05/03/2020  IV Rocephin 05/04/2020>>>>>> 05/06/2020  Keflex 05/06/2020 >>>> 05/20/2020   Subjective: Patient sitting up in bed.  Complaining of pain around nephrostomy tube.  No chest pain.  No shortness of breath.  Complaining of bladder spasms.  Objective: Vitals:   05/05/20 0501 05/05/20 1143 05/05/20 2022 05/06/20 0534  BP: (!) 164/70 140/66 (!) 166/95 (!) 175/67  Pulse: (!) 52 (!) 47 (!) 57 (!) 52  Resp: 18 20 17  (!) 21  Temp: 97.6 F (36.4 C) 98.4 F (36.9 C) 98.3 F (36.8 C) 97.9 F (36.6 C)  TempSrc: Oral Oral Oral Oral  SpO2: 98% 95% 96% 98%  Weight: 52.8 kg     Height:        Intake/Output Summary (Last 24 hours) at 05/06/2020 1113 Last data filed at 05/06/2020 0855 Gross per 24 hour  Intake 357.86 ml  Output 3625 ml  Net -3267.14 ml   Filed Weights   05/02/20 1644 05/04/20 0522 05/05/20 0501  Weight: 51 kg 52.6 kg 52.8 kg    Examination:  General exam: NAD Respiratory system: Lungs clear to auscultation  bilaterally.  No wheezes, no crackles, no rhonchi.  Normal respiratory effort.  Cardiovascular system: RRR no murmurs rubs or gallops.  No JVD.  No lower extremity edema.  Gastrointestinal system: Abdomen is nontender, nondistended, soft, positive bowel sounds.  Decreased tenderness to palpation around nephrostomy tube.  No rebound.  No guarding.  Central nervous system: Alert and oriented.  No focal neurological deficits.   Extremities: Symmetric 5 x 5 power. Skin: No rashes, lesions or ulcers Psychiatry: Judgement and insight appear normal. Mood & affect appropriate.     Data Reviewed: I have personally reviewed following labs and imaging studies  CBC: Recent Labs  Lab 05/02/20 1718 05/03/20 0500 05/04/20 0504 05/05/20 0544 05/06/20 0556  WBC 13.2* 18.8* 12.2* 9.0 6.3  NEUTROABS 10.9* 16.1* 9.0* 5.7  --   HGB 13.3 12.8* 11.2* 11.3* 12.6*  HCT 42.1 39.7 35.9* 35.2* 39.6  MCV 98.6 98.3 100.3* 98.6 98.3  PLT 157 160 99* 100* 120*    Basic Metabolic Panel: Recent Labs  Lab 05/03/20 0500 05/03/20 1353 05/04/20 0504 05/05/20 0544 05/06/20 0556  NA 139 147* 144 140 140  K 6.1* 3.1* 2.9* 3.0* 3.9  CL 108 109 109 105 104  CO2 24 26 25 24 26   GLUCOSE 127* 140* 92 118* 101*  BUN 30* 30* 30* 24* 20  CREATININE 2.61* 2.17* 2.09* 1.54* 1.37*  CALCIUM 8.1* 8.6* 8.2* 8.3* 8.6*  PHOS  --  2.5  --  2.3* 2.3*    GFR: Estimated Creatinine Clearance: 33.7 mL/min (A) (by C-G formula based on SCr of 1.37 mg/dL (H)).  Liver Function Tests: Recent Labs  Lab 05/02/20 1718 05/03/20 0500 05/03/20 1353 05/05/20 0544 05/06/20 0556  AST 18 54*  --   --   --   ALT 13 20  --   --   --   ALKPHOS 93 70  --   --   --   BILITOT 0.6 2.4*  --   --   --   PROT 7.7 5.6*  --   --   --   ALBUMIN 4.4 3.4* 3.6 3.0* 3.4*    CBG: Recent Labs  Lab 05/05/20 2019  GLUCAP 135*     Recent Results (from the past 240 hour(s))  Blood culture (routine single)     Status: None (Preliminary  result)   Collection Time: 05/02/20  5:18 PM   Specimen: BLOOD LEFT FOREARM  Result Value Ref Range Status   Specimen Description BLOOD LEFT FOREARM  Final   Special Requests   Final    BOTTLES DRAWN AEROBIC AND ANAEROBIC Blood Culture adequate volume   Culture   Final    NO GROWTH 3 DAYS Performed at [facility], [address] [place], [place] [address]    Report Status PENDING  Incomplete  SARS Coronavirus 2 by RT PCR (hospital order, performed in [place] hospital lab) Nasopharyngeal Nasopharyngeal Swab     Status: None   Collection Time: 05/02/20  5:51 PM   Specimen: Nasopharyngeal Swab  Result Value Ref Range Status   SARS Coronavirus 2 NEGATIVE NEGATIVE Final    Comment: (NOTE) SARS-CoV-2 target nucleic acids are NOT DETECTED.  The SARS-CoV-2 RNA is generally detectable in upper and lower respiratory specimens during the acute phase of infection. The lowest concentration of SARS-CoV-2 viral copies this assay can detect is 250 copies / mL. A negative result does not preclude SARS-CoV-2 infection and should not be used as the sole basis for treatment or other patient management decisions.  A negative result may occur with improper specimen collection / handling, submission of specimen other than nasopharyngeal swab, presence of viral mutation(s) within the areas targeted by this assay, and inadequate number of viral copies (<250 copies / mL). A negative result must be combined with clinical observations, patient history, and epidemiological information.  Fact Sheet for Patients:   StrictlyIdeas.no  Fact Sheet for Healthcare Providers: BankingDealers.co.za  This test is not yet approved or  cleared by the [place] FDA and has been authorized for detection and/or diagnosis of SARS-CoV-2 by FDA under an Emergency Use Authorization (EUA).  This EUA will remain in effect (meaning this test can be used) for the duration of  the COVID-19 declaration under Section 564(b)(1) of the Act, 21 U.S.C. section 360bbb-3(b)(1), unless the authorization is terminated or revoked sooner.  Performed at [facility], [address], [place], [place] [address]   Culture, blood (single)     Status: Abnormal   Collection Time: 05/02/20  6:11 PM   Specimen: BLOOD RIGHT HAND  Result Value Ref Range Status   Specimen Description   Final    BLOOD RIGHT HAND Performed at [facility], [address] [place], [place] [address]    Special Requests   Final    BOTTLES DRAWN AEROBIC AND ANAEROBIC Blood Culture adequate volume Performed at [place] [place],  [address]    Culture  Setup Time   Final    GRAM NEGATIVE RODS ANAEROBIC BOTTLE ONLY Gram Stain Report Called to,Read Back By and Verified With: GARRISON G. AT [id number] ON [id number] BY Anayia Eugene S. CRITICAL RESULT CALLED TO, READ BACK BY AND VERIFIED WITH: S CHRISTY PHARMD 1607 05/03/20 A BROWNING AEROBIC BOTTLE ALSO PREVIOUSLY CALLED Performed at [facility], [place] [address], [place], [place] [address]    Culture PROTEUS MIRABILIS (A)  Final   Report Status 05/05/2020 FINAL  Final   Organism ID, Bacteria PROTEUS MIRABILIS  Final      Susceptibility   Proteus mirabilis - MIC*    AMPICILLIN RESISTANT Resistant     CEFAZOLIN 8 SENSITIVE Sensitive     CEFEPIME <=0.12 SENSITIVE Sensitive     CEFTAZIDIME <=1 SENSITIVE Sensitive     CEFTRIAXONE <=0.25 SENSITIVE Sensitive     CIPROFLOXACIN <=0.25 SENSITIVE Sensitive     GENTAMICIN <=1 SENSITIVE Sensitive  IMIPENEM 4 SENSITIVE Sensitive     TRIMETH/SULFA <=20 SENSITIVE Sensitive     AMPICILLIN/SULBACTAM 16 INTERMEDIATE Intermediate     PIP/TAZO <=4 SENSITIVE Sensitive     * PROTEUS MIRABILIS  Blood Culture ID Panel (Reflexed)     Status: Abnormal   Collection Time: 05/02/20  6:11 PM  Result Value Ref Range Status   Enterococcus faecalis NOT DETECTED NOT DETECTED Final   Enterococcus Faecium NOT  DETECTED NOT DETECTED Final   Listeria monocytogenes NOT DETECTED NOT DETECTED Final   Staphylococcus species NOT DETECTED NOT DETECTED Final   Staphylococcus aureus (BCID) NOT DETECTED NOT DETECTED Final   Staphylococcus epidermidis NOT DETECTED NOT DETECTED Final   Staphylococcus lugdunensis NOT DETECTED NOT DETECTED Final   Streptococcus species NOT DETECTED NOT DETECTED Final   Streptococcus agalactiae NOT DETECTED NOT DETECTED Final   Streptococcus pneumoniae NOT DETECTED NOT DETECTED Final   Streptococcus pyogenes NOT DETECTED NOT DETECTED Final   A.calcoaceticus-baumannii NOT DETECTED NOT DETECTED Final   Bacteroides fragilis NOT DETECTED NOT DETECTED Final   Enterobacterales DETECTED (A) NOT DETECTED Final    Comment: Enterobacterales represent a large order of gram negative bacteria, not a single organism. CRITICAL RESULT CALLED TO, READ BACK BY AND VERIFIED WITH: S CHRISTY PHARMD 1607 05/03/20 A BROWNING    Enterobacter cloacae complex NOT DETECTED NOT DETECTED Final   Escherichia coli NOT DETECTED NOT DETECTED Final   Klebsiella aerogenes NOT DETECTED NOT DETECTED Final   Klebsiella oxytoca NOT DETECTED NOT DETECTED Final   Klebsiella pneumoniae NOT DETECTED NOT DETECTED Final   Proteus species DETECTED (A) NOT DETECTED Final    Comment: CRITICAL RESULT CALLED TO, READ BACK BY AND VERIFIED WITH: S CHRISTY PHARMD 1607 05/03/20 A BROWNING    Salmonella species NOT DETECTED NOT DETECTED Final   Serratia marcescens NOT DETECTED NOT DETECTED Final   Haemophilus influenzae NOT DETECTED NOT DETECTED Final   Neisseria meningitidis NOT DETECTED NOT DETECTED Final   Pseudomonas aeruginosa NOT DETECTED NOT DETECTED Final   Stenotrophomonas maltophilia NOT DETECTED NOT DETECTED Final   Candida albicans NOT DETECTED NOT DETECTED Final   Candida auris NOT DETECTED NOT DETECTED Final   Candida glabrata NOT DETECTED NOT DETECTED Final   Candida krusei NOT DETECTED NOT DETECTED Final    Candida parapsilosis NOT DETECTED NOT DETECTED Final   Candida tropicalis NOT DETECTED NOT DETECTED Final   Cryptococcus neoformans/gattii NOT DETECTED NOT DETECTED Final   CTX-M ESBL NOT DETECTED NOT DETECTED Final   Carbapenem resistance IMP NOT DETECTED NOT DETECTED Final   Carbapenem resistance KPC NOT DETECTED NOT DETECTED Final   Carbapenem resistance NDM NOT DETECTED NOT DETECTED Final   Carbapenem resist OXA 48 LIKE NOT DETECTED NOT DETECTED Final   Carbapenem resistance VIM NOT DETECTED NOT DETECTED Final    Comment: Performed at [facility], [address], [place], [place] [address]  Aerobic/Anaerobic Culture (surgical/deep wound)     Status: None (Preliminary result)   Collection Time: 05/03/20 12:08 AM   Specimen: Kidney; Urine  Result Value Ref Range Status   Specimen Description   Final    KIDNEY Performed at [place] [address] [place], [place] [address]    Special Requests   Final    NONE Performed at [facility], [place] [address], [place], [place] [address]    Gram Stain   Final    ABUNDANT WBC PRESENT, PREDOMINANTLY PMN RARE GRAM NEGATIVE RODS Performed at [facility], [address], [place], [place] [address]    Culture   Final    MODERATE PROTEUS MIRABILIS NO ANAEROBES ISOLATED; CULTURE IN PROGRESS FOR 5 DAYS    Report Status PENDING  Incomplete   Organism ID, Bacteria PROTEUS MIRABILIS  Final      Susceptibility   Proteus mirabilis - MIC*    AMPICILLIN <=2 SENSITIVE Sensitive     CEFAZOLIN <=4 SENSITIVE Sensitive     CEFEPIME <=0.12 SENSITIVE Sensitive     CEFTAZIDIME <=1 SENSITIVE Sensitive     CEFTRIAXONE <=0.25 SENSITIVE Sensitive     CIPROFLOXACIN <=0.25 SENSITIVE Sensitive     GENTAMICIN <=1 SENSITIVE Sensitive     IMIPENEM 2 SENSITIVE Sensitive     TRIMETH/SULFA <=20 SENSITIVE Sensitive     AMPICILLIN/SULBACTAM <=2 SENSITIVE Sensitive     PIP/TAZO <=4 SENSITIVE Sensitive     *  MODERATE PROTEUS MIRABILIS  Culture, Urine     Status: Abnormal   Collection Time: 05/03/20  9:33 AM   Specimen: Urine, Catheterized  Result Value Ref Range Status   Specimen Description   Final    URINE, CATHETERIZED Performed at [place] [address] [place], [place] [address]    Special Requests   Final    NONE Performed at [facility], [place] [address], [place], [place] [address]    Culture >=100,000 COLONIES/mL PROTEUS MIRABILIS (A)  Final   Report Status 05/05/2020 FINAL  Final   Organism ID, Bacteria PROTEUS MIRABILIS (A)  Final      Susceptibility   Proteus mirabilis - MIC*    AMPICILLIN <=2 SENSITIVE Sensitive     CEFAZOLIN <=4 SENSITIVE Sensitive     CEFTRIAXONE <=0.25 SENSITIVE Sensitive     CIPROFLOXACIN <=0.25 SENSITIVE Sensitive     GENTAMICIN <=1 SENSITIVE Sensitive     IMIPENEM 1 SENSITIVE Sensitive     NITROFURANTOIN 128 RESISTANT Resistant     TRIMETH/SULFA <=20 SENSITIVE Sensitive     AMPICILLIN/SULBACTAM <=2 SENSITIVE Sensitive     PIP/TAZO <=4 SENSITIVE Sensitive     * >=100,000 COLONIES/mL PROTEUS MIRABILIS         Radiology Studies: No results found.      Scheduled Meds: . amLODipine  10 mg Oral Daily  . carvedilol  6.25 mg Oral BID WC  . cephALEXin  500 mg Oral Q6H  . levothyroxine  75 mcg Oral Q0600   Continuous Infusions:    LOS: 3 days    Time spent: 35 minutes    Irine Seal, MD [facility]   To contact the attending provider between 7A-7P or the covering provider during after hours 7P-7A, please log into the web site [email or web address] and access using universal [place] password for that web site. If you do not have the password, please call the hospital operator.  05/06/2020, 11:13 AM

## 2020-05-06 NOTE — Progress Notes (Signed)
Pt refuses CPAP QHS, RT to monitor and assess as needed.  

## 2020-05-07 DIAGNOSIS — R251 Tremor, unspecified: Secondary | ICD-10-CM

## 2020-05-07 LAB — BASIC METABOLIC PANEL
Anion gap: 11 (ref 5–15)
BUN: 25 mg/dL — ABNORMAL HIGH (ref 8–23)
CO2: 25 mmol/L (ref 22–32)
Calcium: 8.6 mg/dL — ABNORMAL LOW (ref 8.9–10.3)
Chloride: 98 mmol/L (ref 98–111)
Creatinine, Ser: 1.74 mg/dL — ABNORMAL HIGH (ref 0.61–1.24)
GFR calc Af Amer: 43 mL/min — ABNORMAL LOW (ref >60)
GFR calc non Af Amer: 37 mL/min — ABNORMAL LOW (ref >60)
Glucose, Bld: 103 mg/dL — ABNORMAL HIGH (ref 70–99)
Potassium: 3.3 mmol/L — ABNORMAL LOW (ref 3.5–5.1)
Sodium: 134 mmol/L — ABNORMAL LOW (ref 135–145)

## 2020-05-07 LAB — CBC WITH DIFFERENTIAL/PLATELET
Abs Immature Granulocytes: 0.02 10*3/uL (ref 0.00–0.07)
Basophils Absolute: 0 10*3/uL (ref 0.0–0.1)
Basophils Relative: 1 %
Eosinophils Absolute: 0.4 10*3/uL (ref 0.0–0.5)
Eosinophils Relative: 7 %
HCT: 40.2 % (ref 39.0–52.0)
Hemoglobin: 13 g/dL (ref 13.0–17.0)
Immature Granulocytes: 0 %
Lymphocytes Relative: 36 %
Lymphs Abs: 2.3 10*3/uL (ref 0.7–4.0)
MCH: 31.1 pg (ref 26.0–34.0)
MCHC: 32.3 g/dL (ref 30.0–36.0)
MCV: 96.2 fL (ref 80.0–100.0)
Monocytes Absolute: 0.5 10*3/uL (ref 0.1–1.0)
Monocytes Relative: 7 %
Neutro Abs: 3.3 10*3/uL (ref 1.7–7.7)
Neutrophils Relative %: 49 %
Platelets: 146 10*3/uL — ABNORMAL LOW (ref 150–400)
RBC: 4.18 MIL/uL — ABNORMAL LOW (ref 4.22–5.81)
RDW: 12.8 % (ref 11.5–15.5)
WBC: 6.6 10*3/uL (ref 4.0–10.5)
nRBC: 0 % (ref 0.0–0.2)

## 2020-05-07 MED ORDER — AMLODIPINE BESYLATE 10 MG PO TABS: 10.0000 mg | ORAL_TABLET | Freq: Every day | ORAL | 1 refills | Status: DC

## 2020-05-07 MED ORDER — CEPHALEXIN 500 MG PO CAPS
500.0000 mg | ORAL_CAPSULE | Freq: Three times a day (TID) | ORAL | Status: DC
  Administered 2020-05-07: 500 mg via ORAL
  Filled 2020-05-07: qty 1

## 2020-05-07 MED ORDER — MIRABEGRON ER 25 MG PO TB24: 25.0000 mg | ORAL_TABLET | Freq: Every day | ORAL | 0 refills | Status: DC | PRN

## 2020-05-07 MED ORDER — POTASSIUM CHLORIDE CRYS ER 20 MEQ PO TBCR
40.0000 meq | EXTENDED_RELEASE_TABLET | Freq: Once | ORAL | Status: AC
  Administered 2020-05-07: 40 meq via ORAL
  Filled 2020-05-07: qty 2

## 2020-05-07 MED ORDER — SODIUM CHLORIDE 0.9 % IV SOLN: INTRAVENOUS | Status: DC

## 2020-05-07 MED ORDER — CEPHALEXIN 500 MG PO CAPS: 500.0000 mg | ORAL_CAPSULE | Freq: Three times a day (TID) | ORAL | 0 refills | Status: AC

## 2020-05-07 NOTE — Discharge Summary (Signed)
Physician Discharge Summary  Jordan Ward XIP:[id number] DOB: 1941-05-06 DOA: 05/02/2020  PCP: [name], MD  Admit date: 05/02/2020 Discharge date: 05/07/2020  Time spent: 50 minutes  Recommendations for Outpatient Follow-up:  1. Follow-up with Dr. [name] in 1 to 2 weeks. 2. Follow-up with [name], MD in 1 to 2 weeks.  On follow-up patient need a basic metabolic profile done to follow-up on electrolytes and renal function.   Discharge Diagnoses:  Principal Problem:   Hydronephrosis due to obstruction of ureter Active Problems:   Bacteremia due to Gram-negative bacteria   Multiple sclerosis (HCC)   Chronic suprapubic catheter (HCC)   Pulmonary fibrosis (HCC)   Tremors of nervous system   Hypothyroidism   Hydronephrosis with obstructing calculus   CKD (chronic kidney disease), stage III   Hydronephrosis of left kidney   Pressure injury of skin   Sepsis secondary to UTI ([place])   Complicated UTI (urinary tract infection)   Acute kidney injury superimposed on CKD (HCC)   Dehydration   Acute lower UTI   Acute unilateral obstructive uropathy   Discharge Condition: Stable and improved  Diet recommendation: Heart healthy  Filed Weights   05/04/20 0522 05/05/20 0501 05/07/20 0500  Weight: 52.6 kg 52.8 kg 49.2 kg    History of present illness:  HPI per Dr. Debby Ward is a 79 y.o. male with history of multiple sclerosis with suprapubic catheter which was recently changed 2 weeks ago with history of hypothyroidism sleep apnea recent admission in June for CBD stone and had undergone ERCP with sphincterotomy had been noticing decreasing urine output since the afternoon prior to admission with some flank pain.  Had chills and rigors.  Deniedf nausea vomiting.  ED Course: In the ER at [facility] patient was found to have a fever of 101.1 and CT renal study shows acute left-sided hydroureteronephrosis with ureteral stone.  On-call urologist Dr. Alyson Ingles was  consulted by the ER physician who requested nephrostomy tube placement urgently in the setting of possible developing sepsis and Dr. Earleen Newport interventional radiology was consulted and patient was transferred to [facility].  Patient had nephrostomy tube placed and was being admitted for further management.  Patient had blood cultures drawn empiric antibiotic started.  Labs are remarkable for creatinine of 1.8 which increased from baseline of 1.3 WBC count of 13.2 lactic acid was normal EKG shows normal sinus rhythm.  Hospital Course:  1 acute left-sided hydroureteronephrosis with ureteral stone in the setting of possible early sepsis from UTI and Proteus bacteremia. Patient seen in consultation by urology and also by IR and underwent left-sided nephrostomy tube placement by Dr. Earleen Newport IR.  Blood cultures with Proteus Mirabella's.  Urine cultures with > 100,000 colonies of Proteus mirabilis.  Patient still with urethral stone.  Afebrile.  Improving clinically.    Patient hydrated with IV fluids, and placed empirically on IV antibiotics.  Renal function improved.  Patient initially was on IV cefepime and IV vancomycin.  IV vancomycin discontinued.  IV cefepime has been narrowed to IV Rocephin.    IV Rocephin and subsequently transition to oral Keflex 500 mg p.o. 3 times daily x14 more days.  Patient improved was afebrile with normal white count by day of discharge. Antibiotic regimen discussed with ID.  Outpatient follow-up with urology for further stone management and nephrostomy tubes. Urology followed the patient during the hospitalization.  2.  Early sepsis secondary to Proteus bacteremia and Proteus UTI.  Patient had presented  on admission with a leukocytosis, urinalysis worrisome for UTI, acute renal failure, fever with temp of 101.1.  Patient pancultured with blood cultures positive for Proteus bacteremia likely seeded from the urine as urine cultures were positive for Proteus UTI.  Patient was  on IV vancomycin IV cefepime.  IV vancomycin discontinued.  IV cefepime narrowed to IV Rocephin.    Patient subsequently transition to oral Keflex 500 mg 3 times daily 3 times daily (renally dosed ) which patient will be discharged home on for another 14 days as patient with nephrolithiasis.  Antibiotic coverage discussed with ID.  Outpatient follow-up with urology.   3.  Hypokalemia Repleted.  On day of discharge patient's potassium was 3.3.  Patient given K. Dur 40 mEq p.o. x1.  Outpatient follow-up.  4.  Acute renal failure in the solitary functioning kidney on chronic kidney disease stage IIIa Likely secondary to problem #1 and volume depletion..  Status post urostomy tube placement.    Patient with good urine output during the hospitalization.  Patient also hydrated IV fluids.  Renal function improved.  Outpatient follow-up.     5.  Hypernatremia Secondary to dehydration.  Improved with hydration.  Outpatient follow-up.   6.  Hypothyroidism Patient maintained on home regimen Synthroid.    7.  Obstructive sleep apnea CPAP nightly.  8.  History of tremors  9.  History of pulmonary fibrosis Remained stable.  Outpatient follow-up.    10.  History of multiple sclerosis   11.  Chronic pain Patient maintained on home regimen of pain medications.  Outpatient follow-up.    12.  Neurogenic bladder Status post suprapubic catheter.  Urine output improved after nephrostomy tube placement.  Patient was placed on Ditropan as needed for bladder spasms.  Patient was seen by urology during the hospitalization.  Outpatient follow-up with urology.  75.  Hypertension Patient initially started on home regimen Norvasc 5 mg daily which was uptitrated to 10 mg daily for better blood pressure control.  Patient also maintained on home regimen of Coreg 6.25 mg twice daily.  Outpatient follow-up with PCP.   14.  Pressure injury Pressure Ulcer 08/03/15 Stage I -  Intact skin with  non-blanchable redness of a localized area usually over a bony prominence. Tip of left great toe red (Active)  08/03/15 1425  Location: Toe (Comment  which one)  Location Orientation: Left (left great toe)  Staging: Stage I -  Intact skin with non-blanchable redness of a localized area usually over a bony prominence.  Wound Description (Comments): Tip of left great toe red  Present on Admission:      Pressure Injury 05/26/17 Stage I -  Intact skin with non-blanchable redness of a localized area usually over a bony prominence.  calleous and non-blanchable redness to ankle (Active)  05/26/17 0157  Location: Ankle  Location Orientation: Right  Staging: Stage I -  Intact skin with non-blanchable redness of a localized area usually over a bony prominence.  Wound Description (Comments):  calleous and non-blanchable redness to ankle  Present on Admission: Yes     Pressure Injury 05/03/20 Sacrum Mid Stage 1 -  Intact skin with non-blanchable redness of a localized area usually over a bony prominence. (Active)  05/03/20 1800  Location: Sacrum  Location Orientation: Mid  Staging: Stage 1 -  Intact skin with non-blanchable redness of a localized area usually over a bony prominence.  Wound Description (Comments):   Present on Admission: Yes       Procedures:  CT renal stone protocol 05/02/2020  Chest x-ray 05/02/2020  Left nephrostomy tube placement per IR: Dr. Earleen Newport 05/02/2020  Consultations:  IR: Dr. Earleen Newport 05/02/2020  Urology: Dr. Alinda Money 05/03/2020  Discharge Exam: Vitals:   05/06/20 2024 05/07/20 0445  BP: (!) 155/79 (!) 158/77  Pulse: 66 (!) 58  Resp: (!) 24 20  Temp: 98.2 F (36.8 C) 97.8 F (36.6 C)  SpO2: 98% 99%    General: NAD Cardiovascular: RRR Respiratory: CTAB  Discharge Instructions   Discharge Instructions    Diet - low sodium heart healthy   Complete by: As directed    Discharge wound care:   Complete by: As directed    As per hospitalization .    Increase activity slowly   Complete by: As directed      Allergies as of 05/07/2020      Reactions   Tetracyclines & Related Anaphylaxis, Rash   Ciprofloxacin    Trouble swallowing unknown reaction according to wife       Medication List    TAKE these medications   amLODipine 10 MG tablet Commonly known as: NORVASC Take 1 tablet (10 mg total) by mouth daily. What changed:   medication strength  how much to take   carvedilol 6.25 MG tablet Commonly known as: COREG Take 6.25 mg by mouth 2 (two) times daily with a meal.   cephALEXin 500 MG capsule Commonly known as: KEFLEX Take 1 capsule (500 mg total) by mouth every 8 (eight) hours for 14 days.   diazepam 5 MG tablet Commonly known as: VALIUM TAKE (1) TABLET BY MOUTH TWICE DAILY.   HYDROcodone-acetaminophen 7.5-325 MG tablet Commonly known as: NORCO Take 1 tablet by mouth 2 (two) times daily. Max APAP 3 GM IN 24 HOURS FROM ALL SOURCES What changed:   when to take this  additional instructions   levothyroxine 75 MCG tablet Commonly known as: SYNTHROID Take 75 mcg by mouth daily before breakfast. ALL MEDICATIONS TO BE CRUSHED AND PLACED IN APPLESAUCE   mirabegron ER 25 MG Tb24 tablet Commonly known as: Myrbetriq Take 1 tablet (25 mg total) by mouth daily as needed (bladder spasms).            Discharge Care Instructions  (From admission, onward)         Start     Ordered   05/07/20 0000  Discharge wound care:       Comments: As per hospitalization .   05/07/20 1358         Allergies  Allergen Reactions  . Tetracyclines & Related Anaphylaxis and Rash  . Ciprofloxacin     Trouble swallowing unknown reaction according to wife     Follow-up Information    [name], MD. Schedule an appointment as soon as possible for a visit in 1 week(s).   Specialty: Internal Medicine Why: f/u in 1 -2 weeks. Contact information: [facility] [address] [phone]        Franchot Gallo, MD. Schedule an appointment as soon as possible for a visit in 1 week(s).   Specialty: Urology Why: f/u in 1-2 weeks. Contact information: [place] [address] [phone]                The results of significant diagnostics from this hospitalization (including imaging, microbiology, ancillary and laboratory) are listed below for reference.    Significant Diagnostic Studies: DG Abd 1 View - KUB  Result Date: 04/19/2020  CLINICAL DATA:  Follow-up common bile duct stent EXAM: ABDOMEN - 1 VIEW COMPARISON:  03/03/2020 FINDINGS: Scattered large and small bowel gas is noted. No obstructive changes are seen. Biliary stent is again seen in the right upper quadrant. Pneumobilia is seen related to the stent. No bony abnormality is noted. IMPRESSION: Biliary stent in place with associated pneumobilia. Electronically Signed   By: Inez Catalina M.D.   On: 04/19/2020 16:46   DG Chest Port 1 View  Result Date: 05/02/2020 CLINICAL DATA:  Sepsis. EXAM: PORTABLE CHEST 1 VIEW COMPARISON:  Ten thousand eighteen FINDINGS: Chronic bronchitic changes are noted bilaterally. There is no pneumothorax. No pleural effusion. No acute osseous abnormality. There are old healed left-sided rib fractures. The heart size is unchanged. Aortic calcifications are noted. There is a lucency projecting over the partially visualized liver shadow. IMPRESSION: 1. No acute cardiopulmonary process. 2. Lucency projecting over the partially visualized liver shadow. This may represent pneumobilia, perhaps from recent sphincterotomy. Correlation with a dedicated abdominal radiograph is recommended. Electronically Signed   By: Constance Holster M.D.   On: 05/02/2020 17:35   DG ERCP  Result Date: 04/20/2020 CLINICAL DATA:  79 year old male with a history choledocholithiasis EXAM: ERCP TECHNIQUE: Multiple spot images obtained with the fluoroscopic device and submitted for interpretation post-procedure.  FLUOROSCOPY TIME:  Fluoroscopy Time: 2 minutes 40 seconds COMPARISON:  None. FINDINGS: Limited images during ERCP. Initial image demonstrates endoscope projecting over the upper abdomen. Subsequently there is cannulation of the ampulla and retrograde infusion of contrast partially opacifying the extrahepatic biliary system. Deployment of a stone retrieval basket and retrieval balloon. Surgical changes of cholecystectomy. IMPRESSION: Limited images during ERCP demonstrates treatment of choledocholithiasis with deployment of a basket and retrieval balloon. Please refer to the dictated operative report for full details of intraoperative findings and procedure. Electronically Signed   By: Corrie Mckusick D.O.   On: 04/20/2020 13:02   CT Renal Stone Study  Result Date: 05/02/2020 CLINICAL DATA:  Flank pain. Chronic suprapubic catheter replaced last week. EXAM: CT ABDOMEN AND PELVIS WITHOUT CONTRAST TECHNIQUE: Multidetector CT imaging of the abdomen and pelvis was performed following the standard protocol without IV contrast. COMPARISON:  CT dated March 01, 2020 FINDINGS: Lower chest: There are chronic changes at the lung bases bilaterally with areas of subpleural reticulation.The heart size is normal. Hepatobiliary: The liver is normal. Status post cholecystectomy.There is pneumobilia primarily within the left hepatic lobe. This corresponds well with the finding seen on the patient's recent chest x-ray and is likely related to prior sphincterotomy. Pancreas: Normal contours without ductal dilatation. No peripancreatic fluid collection. Spleen: Unremarkable. Adrenals/Urinary Tract: --Adrenal glands: Unremarkable. --Right kidney/ureter: The right kidney is atrophic without evidence for hydronephrosis. There are nonobstructing stones in the lower pole the right kidney. --Left kidney/ureter: There is new severe left-sided hydroureteronephrosis secondary to an obstructing stone in the distal left ureter measuring approximately  8 mm (axial series 2, image 57). There may be an additional stone fragment at the left UVJ measuring approximately 5 mm (axial series 2, image 70). There are residual stones in the left kidney measuring up to approximately 1 cm in the lower pole. --Urinary bladder: The bladder is decompressed with a suprapubic catheter. The catheter appears to be grossly well position. There is hyperdense material within the collapsed urinary bladder which may represent contrast or stone material/gravel. Stomach/Bowel: --Stomach/Duodenum: No hiatal hernia or other gastric abnormality. Normal duodenal course and caliber. --Small bowel: Unremarkable. --Colon: The patient is status post prior left  hemicolectomy. There is a surgical anastomosis in the patient's pelvis that is patent. --Appendix: Surgically absent. Vascular/Lymphatic: Atherosclerotic calcification is present within the non-aneurysmal abdominal aorta, without hemodynamically significant stenosis. 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 --No retroperitoneal lymphadenopathy. --No mesenteric lymphadenopathy. --No pelvic or inguinal lymphadenopathy. Reproductive: Unremarkable Other: Again noted is significant diastasis of the low anterior abdominal [place]. Musculoskeletal. 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 IMPRESSION: 1. New severe left-sided hydroureteronephrosis secondary to an obstructing stone in the distal left ureter measuring approximately 8 mm. There may be an additional stone fragment at the left UVJ measuring approximately 5 mm. 2. Bilateral nonobstructing nephrolithiasis.  Atrophic right kidney. 3. The bladder is decompressed with a suprapubic catheter. There is hyperdense material within the collapsed urinary bladder which may represent contrast or stone material/gravel. 4. Pneumobilia, likely related to interval sphincterotomy. This explains the finding seen on the patient's recent  chest x-ray. 5. Avascular necrosis of the right femoral head. 6. 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 7. Additional chronic findings as detailed above. Aortic Atherosclerosis (ICD10-I70.0). Electronically Signed   By: Constance Holster M.D.   On: 05/02/2020 19:28   IR NEPHROSTOMY PLACEMENT LEFT  Result Date: 05/03/2020 INDICATION: 79 year old male with a history of solitary left kidney and hydronephrosis secondary to obstructive left ureteral stone EXAM: IR NEPHROSTOMY PLACEMENT LEFT COMPARISON:  None. MEDICATIONS: None ANESTHESIA/SEDATION: Fentanyl 1.0 mcg IV; Versed 50 mg IV Moderate Sedation Time:  10 minutes The patient was continuously monitored during the procedure by the interventional radiology nurse under my direct supervision. CONTRAST:  64mL OMNIPAQUE IOHEXOL 300 MG/ML SOLN - administered into the collecting system(s) FLUOROSCOPY TIME:  Fluoroscopy Time: 0 minutes 24 seconds (3 mGy). COMPLICATIONS: None PROCEDURE: Informed written consent was obtained from the patient after a thorough discussion of the procedural risks, benefits and alternatives. All questions were addressed. Maximal Sterile Barrier Technique was utilized including caps, mask, sterile gowns, sterile gloves, sterile drape, hand hygiene and skin antiseptic. A timeout was performed prior to the initiation of the procedure. Patient positioned prone position on the fluoroscopy table. Ultrasound survey of the left flank was performed with images stored and sent to PACs. The patient was then prepped and draped in the usual sterile fashion. 1% lidocaine was used to anesthetize the skin and subcutaneous tissues for local anesthesia. A Chiba needle was then used to access a posterior inferior calyx with ultrasound guidance. With spontaneous urine returned through the needle, passage of an 018 micro wire into the collecting system was performed under fluoroscopy. A small  incision was made with an 11 blade scalpel, and the needle was removed from the wire. An Envy system was then advanced over the wire into the collecting system under fluoroscopy. The metal stiffener and inner dilator were removed, and then a sample of fluid was aspirated through the 4 French outer sheath. Bentson wire was passed into the collecting system and the sheath removed. Ten French dilation of the soft tissues was performed. Using modified Seldinger technique, a 10 French pigtail catheter drain was placed over the Bentson wire. Wire and inner stiffener removed, and the pigtail was formed in the collecting system. Small amount of contrast confirmed position of the catheter. Patient tolerated the procedure well and remained hemodynamically stable throughout. No complications were encountered and no significant blood loss encountered IMPRESSION: Status post image guided left-sided percutaneous nephrostomy. Signed, Dulcy Fanny. Earleen Newport,  DO, RPVI [facility] Electronically Signed   By: Corrie Mckusick D.O.   On: 05/03/2020 08:56    Microbiology: Recent Results (from the past 240 hour(s))  Blood culture (routine single)     Status: None (Preliminary result)   Collection Time: 05/02/20  5:18 PM   Specimen: BLOOD LEFT FOREARM  Result Value Ref Range Status   Specimen Description BLOOD LEFT FOREARM  Final   Special Requests   Final    BOTTLES DRAWN AEROBIC AND ANAEROBIC Blood Culture adequate volume   Culture   Final    NO GROWTH 3 DAYS Performed at [facility], [address] [place], [place] [address]    Report Status PENDING  Incomplete  SARS Coronavirus 2 by RT PCR (hospital order, performed in [place] hospital lab) Nasopharyngeal Nasopharyngeal Swab     Status: None   Collection Time: 05/02/20  5:51 PM   Specimen: Nasopharyngeal Swab  Result Value Ref Range Status   SARS Coronavirus 2 NEGATIVE NEGATIVE Final    Comment:  (NOTE) SARS-CoV-2 target nucleic acids are NOT DETECTED.  The SARS-CoV-2 RNA is generally detectable in upper and lower respiratory specimens during the acute phase of infection. The lowest concentration of SARS-CoV-2 viral copies this assay can detect is 250 copies / mL. A negative result does not preclude SARS-CoV-2 infection and should not be used as the sole basis for treatment or other patient management decisions.  A negative result may occur with improper specimen collection / handling, submission of specimen other than nasopharyngeal swab, presence of viral mutation(s) within the areas targeted by this assay, and inadequate number of viral copies (<250 copies / mL). A negative result must be combined with clinical observations, patient history, and epidemiological information.  Fact Sheet for Patients:   StrictlyIdeas.no  Fact Sheet for Healthcare Providers: BankingDealers.co.za  This test is not yet approved or  cleared by the [place] FDA and has been authorized for detection and/or diagnosis of SARS-CoV-2 by FDA under an Emergency Use Authorization (EUA).  This EUA will remain in effect (meaning this test can be used) for the duration of the COVID-19 declaration under Section 564(b)(1) of the Act, 21 U.S.C. section 360bbb-3(b)(1), unless the authorization is terminated or revoked sooner.  Performed at [facility], [address], [place], [place] [address]   Culture, blood (single)     Status: Abnormal   Collection Time: 05/02/20  6:11 PM   Specimen: BLOOD RIGHT HAND  Result Value Ref Range Status   Specimen Description   Final    BLOOD RIGHT HAND Performed at [facility], [address] [place], [place] [address]    Special Requests   Final    BOTTLES DRAWN AEROBIC AND ANAEROBIC Blood Culture adequate volume Performed at [place] [place], [place] [address]    Culture  Setup Time   Final     GRAM NEGATIVE RODS ANAEROBIC BOTTLE ONLY Gram Stain Report Called to,Read Back By and Verified With: GARRISON G. AT [id number] ON [id number] BY Stormi Vandevelde S. CRITICAL RESULT CALLED TO, READ BACK BY AND VERIFIED WITH: S CHRISTY PHARMD 1607 05/03/20 A BROWNING AEROBIC BOTTLE ALSO PREVIOUSLY CALLED Performed at [facility], [place] [address], [place], [place] [address]    Culture PROTEUS MIRABILIS (A)  Final   Report Status 05/05/2020 FINAL  Final   Organism ID, Bacteria PROTEUS MIRABILIS  Final      Susceptibility   Proteus mirabilis - MIC*  AMPICILLIN RESISTANT Resistant     CEFAZOLIN 8 SENSITIVE Sensitive     CEFEPIME <=0.12 SENSITIVE Sensitive     CEFTAZIDIME <=1 SENSITIVE Sensitive     CEFTRIAXONE <=0.25 SENSITIVE Sensitive     CIPROFLOXACIN <=0.25 SENSITIVE Sensitive     GENTAMICIN <=1 SENSITIVE Sensitive     IMIPENEM 4 SENSITIVE Sensitive     TRIMETH/SULFA <=20 SENSITIVE Sensitive     AMPICILLIN/SULBACTAM 16 INTERMEDIATE Intermediate     PIP/TAZO <=4 SENSITIVE Sensitive     * PROTEUS MIRABILIS  Blood Culture ID Panel (Reflexed)     Status: Abnormal   Collection Time: 05/02/20  6:11 PM  Result Value Ref Range Status   Enterococcus faecalis NOT DETECTED NOT DETECTED Final   Enterococcus Faecium NOT DETECTED NOT DETECTED Final   Listeria monocytogenes NOT DETECTED NOT DETECTED Final   Staphylococcus species NOT DETECTED NOT DETECTED Final   Staphylococcus aureus (BCID) NOT DETECTED NOT DETECTED Final   Staphylococcus epidermidis NOT DETECTED NOT DETECTED Final   Staphylococcus lugdunensis NOT DETECTED NOT DETECTED Final   Streptococcus species NOT DETECTED NOT DETECTED Final   Streptococcus agalactiae NOT DETECTED NOT DETECTED Final   Streptococcus pneumoniae NOT DETECTED NOT DETECTED Final   Streptococcus pyogenes NOT DETECTED NOT DETECTED Final   A.calcoaceticus-baumannii NOT DETECTED NOT DETECTED Final   Bacteroides fragilis NOT DETECTED NOT DETECTED Final   Enterobacterales  DETECTED (A) NOT DETECTED Final    Comment: Enterobacterales represent a large order of gram negative bacteria, not a single organism. CRITICAL RESULT CALLED TO, READ BACK BY AND VERIFIED WITH: S CHRISTY PHARMD 1607 05/03/20 A BROWNING    Enterobacter cloacae complex NOT DETECTED NOT DETECTED Final   Escherichia coli NOT DETECTED NOT DETECTED Final   Klebsiella aerogenes NOT DETECTED NOT DETECTED Final   Klebsiella oxytoca NOT DETECTED NOT DETECTED Final   Klebsiella pneumoniae NOT DETECTED NOT DETECTED Final   Proteus species DETECTED (A) NOT DETECTED Final    Comment: CRITICAL RESULT CALLED TO, READ BACK BY AND VERIFIED WITH: S CHRISTY PHARMD 1607 05/03/20 A BROWNING    Salmonella species NOT DETECTED NOT DETECTED Final   Serratia marcescens NOT DETECTED NOT DETECTED Final   Haemophilus influenzae NOT DETECTED NOT DETECTED Final   Neisseria meningitidis NOT DETECTED NOT DETECTED Final   Pseudomonas aeruginosa NOT DETECTED NOT DETECTED Final   Stenotrophomonas maltophilia NOT DETECTED NOT DETECTED Final   Candida albicans NOT DETECTED NOT DETECTED Final   Candida auris NOT DETECTED NOT DETECTED Final   Candida glabrata NOT DETECTED NOT DETECTED Final   Candida krusei NOT DETECTED NOT DETECTED Final   Candida parapsilosis NOT DETECTED NOT DETECTED Final   Candida tropicalis NOT DETECTED NOT DETECTED Final   Cryptococcus neoformans/gattii NOT DETECTED NOT DETECTED Final   CTX-M ESBL NOT DETECTED NOT DETECTED Final   Carbapenem resistance IMP NOT DETECTED NOT DETECTED Final   Carbapenem resistance KPC NOT DETECTED NOT DETECTED Final   Carbapenem resistance NDM NOT DETECTED NOT DETECTED Final   Carbapenem resist OXA 48 LIKE NOT DETECTED NOT DETECTED Final   Carbapenem resistance VIM NOT DETECTED NOT DETECTED Final    Comment: Performed at [facility], [address] [place], [place] [address]  Aerobic/Anaerobic Culture (surgical/deep wound)     Status: None (Preliminary result)    Collection Time: 05/03/20 12:08 AM   Specimen: Kidney; Urine  Result Value Ref Range Status   Specimen Description   Final    KIDNEY Performed at [facility]  [address], [place], [place] [address]    Special Requests   Final    NONE Performed at [facility], [place] [address] [place], [place] [address]    Gram Stain   Final    ABUNDANT WBC PRESENT, PREDOMINANTLY PMN RARE GRAM NEGATIVE RODS Performed at [facility], [place] [address], [place], [place] [address]    Culture   Final    MODERATE PROTEUS MIRABILIS NO ANAEROBES ISOLATED; CULTURE IN PROGRESS FOR 5 DAYS    Report Status PENDING  Incomplete   Organism ID, Bacteria PROTEUS MIRABILIS  Final      Susceptibility   Proteus mirabilis - MIC*    AMPICILLIN <=2 SENSITIVE Sensitive     CEFAZOLIN <=4 SENSITIVE Sensitive     CEFEPIME <=0.12 SENSITIVE Sensitive     CEFTAZIDIME <=1 SENSITIVE Sensitive     CEFTRIAXONE <=0.25 SENSITIVE Sensitive     CIPROFLOXACIN <=0.25 SENSITIVE Sensitive     GENTAMICIN <=1 SENSITIVE Sensitive     IMIPENEM 2 SENSITIVE Sensitive     TRIMETH/SULFA <=20 SENSITIVE Sensitive     AMPICILLIN/SULBACTAM <=2 SENSITIVE Sensitive     PIP/TAZO <=4 SENSITIVE Sensitive     * MODERATE PROTEUS MIRABILIS  Culture, Urine     Status: Abnormal   Collection Time: 05/03/20  9:33 AM   Specimen: Urine, Catheterized  Result Value Ref Range Status   Specimen Description   Final    URINE, CATHETERIZED Performed at [facility] [address], [place], [place] [address]    Special Requests   Final    NONE Performed at [facility],  [address] [place],  [address]    Culture >=100,000 COLONIES/mL PROTEUS MIRABILIS (A)  Final   Report Status 05/05/2020 FINAL  Final   Organism ID, Bacteria PROTEUS MIRABILIS (A)  Final      Susceptibility   Proteus mirabilis - MIC*    AMPICILLIN <=2 SENSITIVE Sensitive     CEFAZOLIN <=4  SENSITIVE Sensitive     CEFTRIAXONE <=0.25 SENSITIVE Sensitive     CIPROFLOXACIN <=0.25 SENSITIVE Sensitive     GENTAMICIN <=1 SENSITIVE Sensitive     IMIPENEM 1 SENSITIVE Sensitive     NITROFURANTOIN 128 RESISTANT Resistant     TRIMETH/SULFA <=20 SENSITIVE Sensitive     AMPICILLIN/SULBACTAM <=2 SENSITIVE Sensitive     PIP/TAZO <=4 SENSITIVE Sensitive     * >=100,000 COLONIES/mL PROTEUS MIRABILIS     Labs: Basic Metabolic Panel: Recent Labs  Lab 05/03/20 1353 05/04/20 0504 05/05/20 0544 05/06/20 0556 05/07/20 0521  NA 147* 144 140 140 134*  K 3.1* 2.9* 3.0* 3.9 3.3*  CL 109 109 105 104 98  CO2 26 25 24 26 25   GLUCOSE 140* 92 118* 101* 103*  BUN 30* 30* 24* 20 25*  CREATININE 2.17* 2.09* 1.54* 1.37* 1.74*  CALCIUM 8.6* 8.2* 8.3* 8.6* 8.6*  PHOS 2.5  --  2.3* 2.3*  --    Liver Function Tests: Recent Labs  Lab 05/02/20 1718 05/03/20 0500 05/03/20 1353 05/05/20 0544 05/06/20 0556  AST 18 54*  --   --   --   ALT 13 20  --   --   --   ALKPHOS 93 70  --   --   --   BILITOT 0.6 2.4*  --   --   --   PROT 7.7 5.6*  --   --   --   ALBUMIN 4.4 3.4* 3.6 3.0* 3.4*   No  results for input(s): LIPASE, AMYLASE in the last 168 hours. No results for input(s): AMMONIA in the last 168 hours. CBC: Recent Labs  Lab 05/02/20 1718 05/02/20 1718 05/03/20 0500 05/04/20 0504 05/05/20 0544 05/06/20 0556 05/07/20 0521  WBC 13.2*   < > 18.8* 12.2* 9.0 6.3 6.6  NEUTROABS 10.9*  --  16.1* 9.0* 5.7  --  3.3  HGB 13.3   < > 12.8* 11.2* 11.3* 12.6* 13.0  HCT 42.1   < > 39.7 35.9* 35.2* 39.6 40.2  MCV 98.6   < > 98.3 100.3* 98.6 98.3 96.2  PLT 157   < > 160 99* 100* 120* 146*   < > = values in this interval not displayed.   Cardiac Enzymes: No results for input(s): CKTOTAL, CKMB, CKMBINDEX, TROPONINI in the last 168 hours. BNP: BNP (last 3 results) No results for input(s): BNP in the last 8760 hours.  ProBNP (last 3 results) No results for input(s): PROBNP in the last 8760  hours.  CBG: Recent Labs  Lab 05/05/20 2019  GLUCAP 135*       Signed:  Irine Seal MD.  [facility] 05/07/2020, 2:03 PM

## 2020-05-07 NOTE — Care Management Important Message (Signed)
Important Message  Patient Details IM Letter given to the Patient Name: Jordan Ward MRN: [id number] Date of Birth: Feb 08, 1941   Medicare Important Message Given:  Yes     Kerin Salen 05/07/2020, 10:02 AM

## 2020-05-07 NOTE — TOC Initial Note (Signed)
Transition of Care [facility]) - Initial/Assessment Note    Patient Details  Name: Jordan Ward MRN: [id number] Date of Birth: 08-11-1941  Transition of Care [facility]) CM/SW Contact:    Joaquin Courts, RN Phone Number: 05/07/2020, 11:09 AM  Clinical Narrative:                \ CM spoke with patient regarding need for [facility] services.  Patient declines services, states he can manage drain on his own and has been doing so at the hospital.    Expected Discharge Plan: [place] Barriers to Discharge: No Barriers Identified   Patient Goals and CMS Choice Patient states their goals for this hospitalization and ongoing recovery are:: to go home CMS Medicare.gov Compare Post Acute Care list provided to:: Patient Choice offered to / list presented to : Patient  Expected Discharge Plan and Services Expected Discharge Plan: [place]   Discharge Planning Services: CM Consult Post Acute Care Choice: [place] arrangements for the past 2 months: Single Family Home                 DME Arranged: N/A DME Agency: NA       HH Arranged: Patient Refused HH          Prior Living Arrangements/Services Living arrangements for the past 2 months: Single Family Home   Patient language and need for interpreter reviewed:: Yes Do you feel safe going back to the place where you live?: Yes      Need for Family Participation in Patient Care: No (Comment) Care giver support system in place?: No (comment)   Criminal Activity/Legal Involvement Pertinent to Current Situation/Hospitalization: No - Comment as needed  Activities of Daily Living Home Assistive Devices/Equipment: Eyeglasses, Environmental consultant (specify type), Other (Comment), CPAP (front wheeled walker, suprapubic catheter, has CPAP but does not use it) ADL Screening (condition at time of admission) Patient's cognitive ability adequate to safely complete daily activities?: Yes Is the patient deaf or have  difficulty hearing?: No Does the patient have difficulty seeing, even when wearing glasses/contacts?: No Does the patient have difficulty concentrating, remembering, or making decisions?: No Patient able to express need for assistance with ADLs?: Yes Does the patient have difficulty dressing or bathing?: No Independently performs ADLs?: Yes (appropriate for developmental age) Does the patient have difficulty walking or climbing stairs?: Yes Weakness of Legs: Both Weakness of Arms/Hands: Left  Permission Sought/Granted                  Emotional Assessment Appearance:: Appears stated age Attitude/Demeanor/Rapport: Engaged Affect (typically observed): Calm Orientation: : Oriented to Self, Oriented to Place, Oriented to  Time, Oriented to Situation   Psych Involvement: No (comment)  Admission diagnosis:  Hydronephrosis [N13.30] Ureterolithiasis [N20.1] Acute unilateral obstructive uropathy [N13.9] SIRS (systemic inflammatory response syndrome) ([place]) [R65.10] Sepsis ([place]) [A41.9] Patient Active Problem List   Diagnosis Date Noted  . Bacteremia due to Gram-negative bacteria 05/04/2020  . Acute lower UTI 05/04/2020  . Acute unilateral obstructive uropathy   . SIRS (systemic inflammatory response syndrome) ([place]) 05/03/2020  . Hydronephrosis 05/02/2020  . Complication of cystostomy catheter, subsequent encounter 04/20/2020  . Acute kidney injury superimposed on CKD ([place]) 03/02/2020  . Dehydration 03/02/2020  . Nausea and vomiting 03/02/2020  . Ventral hernia without obstruction or gangrene 03/01/2020  . Hypotension 03/01/2020  . Poor appetite 03/01/2020  . Urinary tract infection 03/01/2020  . Detrusor areflexia 09/16/2019  . Cerebrovascular disease  08/17/2018  . Partial small bowel obstruction ([place]) 07/11/2017  . Choledocholithiasis 07/11/2017  . Complicated UTI (urinary tract infection)   . Sepsis secondary to UTI ([place]) 06/14/2017  . Paroxysmal atrial tachycardia ([place])  06/14/2017  . Hypophosphatemia 06/14/2017  . Pressure injury of skin 05/26/2017  . Hydronephrosis due to obstruction of ureter 05/26/2017  . Obstructive uropathy 05/25/2017  . CKD (chronic kidney disease), stage III 08/23/2016  . Hydronephrosis of left kidney 08/23/2016  . Kidney stones 09/13/2015  . Essential hypertension   . Pressure ulcer 08/04/2015  . Chronic diastolic (congestive) heart failure ([place]) 08/04/2015  . Hydronephrosis with obstructing calculus 08/04/2015  . Occult blood positive stool 09/05/2014  . Rash and nonspecific skin eruption 08/25/2014  . Edema 08/19/2014  . Subacute confusional state 08/06/2014  . Dilated cardiomyopathy ([place]) 07/21/2014  . Sepsis ([place]) 07/17/2014  . Severe sepsis ([place]) 07/17/2014  . Headache 03/13/2014  . CVA (cerebral infarction) 03/13/2014  . Abnormality of gait 03/13/2014  . Tremors of nervous system 10/08/2011  . Hypothyroidism 10/08/2011  . Pulmonary nodule 10/08/2011  . Dysphagia 10/07/2011  . HTN (hypertension), malignant 10/06/2011  . Chronic suprapubic catheter ([place]) 10/06/2011  . Pulmonary fibrosis ([place]) 10/06/2011  . UTI (urinary tract infection) 10/03/2011  . PNA (pneumonia) 10/02/2011  . Junctional rhythm 10/02/2011  . Multiple sclerosis ([place]) 10/02/2011  . Chronic diarrhea 06/17/2011  . Hx of adenomatous colonic polyps 06/17/2011  . High grade dysplasia in colonic adenoma 09/29/2005   PCP:  [name], MD Pharmacy:   [facility], [place] [address] Phone: [phone] Fax: [phone]     Social Determinants of Health (SDOH) Interventions    Readmission Risk Interventions No flowsheet data found.

## 2020-05-07 NOTE — Progress Notes (Signed)
PHARMACY NOTE:  ANTIMICROBIAL RENAL DOSAGE ADJUSTMENT  Current antimicrobial regimen includes a mismatch between antimicrobial dosage and estimated renal function.  As per policy approved by the Pharmacy & Therapeutics and Medical Executive Committees, the antimicrobial dosage will be adjusted accordingly.  Current antimicrobial dosage:  Cephalexin 500mg  q6h  Indication: Bacteremia secondary to UTI  Renal Function:  Estimated Creatinine Clearance: 24.7 mL/min (A) (by C-G formula based on SCr of 1.74 mg/dL (H)).  Antimicrobial dosage has been changed to:  Cephalexin 500mg  q8h   Thank you for allowing pharmacy to be a part of this patient's care.  Phillis Haggis, [facility] 05/07/2020 11:03 AM

## 2020-05-08 LAB — AEROBIC/ANAEROBIC CULTURE W GRAM STAIN (SURGICAL/DEEP WOUND)

## 2020-05-08 LAB — CULTURE, BLOOD (SINGLE)
Culture: NO GROWTH
Special Requests: ADEQUATE

## 2020-05-14 DIAGNOSIS — B964 Proteus (mirabilis) (morganii) as the cause of diseases classified elsewhere: Secondary | ICD-10-CM | POA: Diagnosis not present

## 2020-05-14 DIAGNOSIS — E876 Hypokalemia: Secondary | ICD-10-CM | POA: Diagnosis not present

## 2020-05-14 DIAGNOSIS — N390 Urinary tract infection, site not specified: Secondary | ICD-10-CM | POA: Diagnosis not present

## 2020-05-14 DIAGNOSIS — I10 Essential (primary) hypertension: Secondary | ICD-10-CM | POA: Diagnosis not present

## 2020-05-14 DIAGNOSIS — Z0189 Encounter for other specified special examinations: Secondary | ICD-10-CM | POA: Diagnosis not present

## 2020-05-14 DIAGNOSIS — Z0001 Encounter for general adult medical examination with abnormal findings: Secondary | ICD-10-CM | POA: Diagnosis not present

## 2020-05-15 ENCOUNTER — Encounter: Payer: Self-pay | Admitting: Urology

## 2020-05-15 ENCOUNTER — Ambulatory Visit (INDEPENDENT_AMBULATORY_CARE_PROVIDER_SITE_OTHER): Payer: Medicare Other | Admitting: Urology

## 2020-05-15 ENCOUNTER — Other Ambulatory Visit: Payer: Self-pay

## 2020-05-15 VITALS — BP 144/71 | HR 57 | Temp 98.5°F

## 2020-05-15 DIAGNOSIS — N201 Calculus of ureter: Secondary | ICD-10-CM | POA: Diagnosis not present

## 2020-05-15 DIAGNOSIS — R339 Retention of urine, unspecified: Secondary | ICD-10-CM | POA: Diagnosis not present

## 2020-05-15 NOTE — Progress Notes (Signed)
Urological Symptom Review ° °Patient is experiencing the following symptoms: °none ° ° °Review of Systems ° °Gastrointestinal (upper)  : °Negative for upper GI symptoms ° °Gastrointestinal (lower) : °Negative for lower GI symptoms ° °Constitutional : °Weight loss ° °Skin: °Negative for skin symptoms ° °Eyes: °Negative for eye symptoms ° °Ear/Nose/Throat : °Negative for Ear/Nose/Throat symptoms ° °Hematologic/Lymphatic: °Negative for Hematologic/Lymphatic symptoms ° °Cardiovascular : °Negative for cardiovascular symptoms ° °Respiratory : °Negative for respiratory symptoms ° °Endocrine: °Negative for endocrine symptoms ° °Musculoskeletal: °Negative for musculoskeletal symptoms ° °Neurological: °Negative for neurological symptoms ° °Psychologic: °Negative for psychiatric symptoms °

## 2020-05-15 NOTE — Progress Notes (Signed)
H&P  Chief Complaint: Bladder Stones  History of Present Illness: Jordan Ward presents for scheduling of his ureteroscopy.  He had urgent placement of a left percutaneous nephrostomy tube approximately 2 weeks ago.  That was for an obstructing, infected ureteral stone.  He still has percutaneous tube present.  He has had no issues with this.  Pt here for f/u with his bladder stone treatment plan. Pt has recently lost a significant amount of weight following dietary limitation on sodium. He has also had a marked reduction in ambulation. Pt will finish his pre-op antibiotics on Friday (8.20.2021).  10.4.2019: Jordan Ward returns today for cystoscopy and SP tube change. He had a stone removed at his last tube change from the tract and he is to have cystoscopy to look for more stones. He was treated for a UTI after his last tube change.     Past Medical History:  Diagnosis Date  . Anemia   . Arthritis   . Back pain, chronic   . Bilateral carotid bruits   . C. difficile colitis 09/2011  . CAD (coronary artery disease)   . Carotid artery stenosis   . Cerebrovascular disease   . Cerebrovascular disease 08/17/2018  . Colon polyps   . Complication of cystostomy catheter, initial encounter ([place]) 04/20/2020  . Dyslipidemia   . Dysphagia 10/07/2011  . Encephalopathy   . Gait disorder   . HA (headache)   . High grade dysplasia in colonic adenoma 09/2005  . History of kidney stones   . HTN (hypertension), malignant 10/06/2011  . Hypernatremia   . Hypokalemia   . Hypothyroidism 10/08/2011  . Insomnia   . Junctional rhythm   . Kidney stones   . MS (multiple sclerosis) ([place])   . Neuromuscular disorder (HCC)    MS  . OSA (obstructive sleep apnea)   . Paroxysmal atrial tachycardia ([place])   . Peripheral vascular disease ([place])   . Pneumonia 4 yrs ago  . Pulmonary fibrosis ([place]) 10/06/2011  . Pulmonary nodule 10/08/2011  . PVD (peripheral vascular disease) ([place])   . Sacral ulcer ([place])   . Sleep apnea    cannot  tolerate  . Stroke [facility])    left sided weakness  . Suprapubic catheter ([place])   . TIA (transient ischemic attack)   . Tremors of nervous system 10/08/2011  . Urinary tract infection   . Ventral hernia without obstruction or gangrene     Large 8X9cm ventral hernia with loss of domain. CT reads report as diastasis recti with herniation or diastasis recti.  Dr. Constance Haw, Surgery, reviewed CT with radiology and there is herniation with only hernia sac or peritoneum over the bowel and large separation of the rectus muslce (i.e. diastasis recti aka loss of domain).  No surgical intervention recommended given size, age, and health.     Past Surgical History:  Procedure Laterality Date  . APPENDECTOMY  09/2005   at time of left hemicolectomy  . BACK SURGERY  1976/1979   lower  . BILIARY DILATION N/A 03/03/2020   Procedure: BILIARY DILATION;  Surgeon: Rogene Houston, MD;  [facility];  Service: Endoscopy;  Laterality: N/A;  . CATARACT EXTRACTION W/PHACO Right 03/08/2018   Procedure: CATARACT EXTRACTION PHACO AND INTRAOCULAR LENS PLACEMENT RIGHT EYE;  Surgeon: Tonny Branch, MD;  Location: [facility];  Service: Ophthalmology;  Laterality: Right;  CDE: 8.86  . CATARACT EXTRACTION W/PHACO Left 04/05/2018   Procedure: CATARACT EXTRACTION PHACO AND INTRAOCULAR LENS PLACEMENT (IOC);  Surgeon: Tonny Branch, MD;  Location: [facility];  Service: Ophthalmology;  Laterality: Left;  CDE: 7.36  . CHOLECYSTECTOMY     Dr. Tamala Julian  . COLON SURGERY  09/2005   [place]man: four tubular adenomas, large adenomatous polyp with HIGH GRADE dysplasia  . COLONOSCOPY  11/2004   Dr. Sharol Roussel sessile polyp splenic flexure, 46mm sessile polyp desc colon, tubulovillous adenoma (bx not removed)  . COLONOSCOPY  01/2005   poor prep, polyp could not be found  . COLONOSCOPY  05/2005   with EMR, polypectomy Dr. Olegario Messier, bx showed high grade dysplasia, partially resected  . COLONOSCOPY  09/2005   Dr. Arsenio Loader, [place] ink tattooing,  four villous colon polyp (3 had been missed on previous colonoscopies due to limitations of procedures  . COLONOSCOPY  09/2006   normal TI, no polyps  . COLONOSCOPY  10/2007   Dr. Imogene Burn distal mammillations, benign bx, normal TI, random bx neg for microscopic colitis  . CYSTOSCOPY WITH LITHOLAPAXY N/A 07/27/2018   Procedure: CYSTOSCOPY WITH LITHOLAPAXY VIA  SUPRAPUBIC TUBE;  Surgeon: Franchot Gallo, MD;  Location: [facility];  Service: Urology;  Laterality: N/A;  . CYSTOSCOPY WITH RETROGRADE PYELOGRAM, URETEROSCOPY AND STENT PLACEMENT Left 06/09/2017   Procedure: CYSTOSCOPY WITH LEFT RETROGRADE PYELOGRAM, LEFT URETEROSCOPY, LEFT URETEROSCOPIC STONE EXTRACTION, LEFT URETERAL STENT PLACEMENT;  Surgeon: Franchot Gallo, MD;  Location: [facility];  Service: Urology;  Laterality: Left;  . ERCP N/A 03/03/2020   Procedure: ENDOSCOPIC RETROGRADE CHOLANGIOPANCREATOGRAPHY (ERCP);  Surgeon: Rogene Houston, MD;  [facility];  Service: Endoscopy;  Laterality: N/A;  . ERCP N/A 04/20/2020   Procedure: ENDOSCOPIC RETROGRADE CHOLANGIOPANCREATOGRAPHY (ERCP);  Surgeon: Rogene Houston, MD;  [facility];  Service: Endoscopy;  Laterality: N/A;  to be done at 7:30am in OR  . GASTROINTESTINAL STENT REMOVAL N/A 04/20/2020   Procedure: STENT REMOVAL;  Surgeon: Rogene Houston, MD;  [facility];  Service: Endoscopy;  Laterality: N/A;  . INGUINAL HERNIA REPAIR  1971   bilateral  . INSERTION OF SUPRAPUBIC CATHETER  06/09/2017   Procedure: EXCHANGE OF SUPRAPUBIC CATHETER;  Surgeon: Franchot Gallo, MD;  Location: [facility];  Service: Urology;;  . IR NEPHROSTOMY PLACEMENT LEFT  05/26/2017  . IR NEPHROSTOMY PLACEMENT LEFT  05/02/2020  . KIDNEY STONE SURGERY  09/13/2015  . LITHOTRIPSY N/A 03/03/2020   Procedure: MECHANICAL LITHOTRIPSY WITH REMOVAL OF MULTIPLE STONE FRAGMENTS;  Surgeon: Rogene Houston, MD;  [facility];  Service: Endoscopy;  Laterality: N/A;  . NEPHROLITHOTOMY  Left 09/13/2015   Procedure: LEFT PERCUTANEOUS NEPHROLITHOTOMY ;  Surgeon: Franchot Gallo, MD;  Location: [facility];  Service: Urology;  Laterality: Left;  . REMOVAL OF STONES N/A 04/20/2020   Procedure: REMOVAL OF STONES;  Surgeon: Rogene Houston, MD;  [facility];  Service: Endoscopy;  Laterality: N/A;  . SPHINCTEROTOMY  03/03/2020   Procedure: BILLARY SPHINCTEROTOMY;  Surgeon: Rogene Houston, MD;  [facility];  Service: Endoscopy;;  . SUPRAPUBIC CATHETER INSERTION      Home Medications:  Allergies as of 05/15/2020      Reactions   Tetracyclines & Related Anaphylaxis, Rash   Ciprofloxacin    Trouble swallowing unknown reaction according to wife       Medication List       Accurate as of May 15, 2020  1:46 PM. If you have any questions, ask your nurse or doctor.        amLODipine 10 MG tablet Commonly known as: NORVASC Take 1 tablet (10  mg total) by mouth daily.   carvedilol 6.25 MG tablet Commonly known as: COREG Take 6.25 mg by mouth 2 (two) times daily with a meal.   cephALEXin 500 MG capsule Commonly known as: KEFLEX Take 1 capsule (500 mg total) by mouth every 8 (eight) hours for 14 days.   diazepam 5 MG tablet Commonly known as: VALIUM TAKE (1) TABLET BY MOUTH TWICE DAILY.   HYDROcodone-acetaminophen 7.5-325 MG tablet Commonly known as: NORCO Take 1 tablet by mouth 2 (two) times daily. Max APAP 3 GM IN 24 HOURS FROM ALL SOURCES What changed:   when to take this  additional instructions   levothyroxine 75 MCG tablet Commonly known as: SYNTHROID Take 75 mcg by mouth daily before breakfast. ALL MEDICATIONS TO BE CRUSHED AND PLACED IN APPLESAUCE   mirabegron ER 25 MG Tb24 tablet Commonly known as: Myrbetriq Take 1 tablet (25 mg total) by mouth daily as needed (bladder spasms).       Allergies:  Allergies  Allergen Reactions  . Tetracyclines & Related Anaphylaxis and Rash  . Ciprofloxacin     Trouble swallowing unknown  reaction according to wife     Family History  Problem Relation Age of Onset  . Cirrhosis Brother        etoh  . Stroke Mother 72  . Coronary artery disease Father 67  . Heart attack Brother   . Cancer Sister   . Multiple sclerosis Other   . Colon cancer Neg Hx     Social History:  reports that he quit smoking about 32 years ago. His smoking use included cigarettes. He has a 25.00 pack-year smoking history. He has never used smokeless tobacco. He reports that he does not drink alcohol and does not use drugs.  ROS: A complete review of systems was performed.  All systems are negative except for pertinent findings as noted.  Physical Exam:  Vital signs in last 24 hours: BP (!) 144/71   Pulse (!) 57   Temp 98.5 F (36.9 C)  Constitutional:  Alert and oriented, No acute distress.  He appears quite thin. Cardiovascular: Regular rate  Respiratory: Normal respiratory effort Lymphatic: No lymphadenopathy Neurologic: Grossly intact, no focal deficits Psychiatric: Normal mood and affect  I have reviewed prior pt notes  I have reviewed notes from referring/previous physicians  I have reviewed urinalysis results  I have independently reviewed prior imaging  I have reviewed prior urine culture  Impression/Assessment:  Left ureteral stone with hydronephrosis/infection, status post urgent percutaneous nephrostomy tube placement.  He is doing well since that time.  He needs ureteroscopy scheduled.  Plan:  1. Pt advised to increase his protein intake.  2. Pt oriented to his upcoming procedure and advised regarding normal risks/complications  3. Pt antibiotic regimen adjusted from TID to BID.  4. F/U with procedure at next availability.  CC: Dr. Nevada Crane

## 2020-05-16 ENCOUNTER — Other Ambulatory Visit: Payer: Self-pay | Admitting: Neurology

## 2020-05-16 MED ORDER — DIAZEPAM 5 MG PO TABS: ORAL_TABLET | ORAL | 3 refills | Status: DC

## 2020-05-16 NOTE — Addendum Note (Signed)
Addended by: Noberto Retort C on: 05/16/2020 12:58 PM   Modules accepted: Orders

## 2020-05-16 NOTE — Telephone Encounter (Signed)
Pt called needing a refill on his diazepam (VALIUM) 5 MG tablet sent in to the [facility]

## 2020-05-17 NOTE — Patient Instructions (Signed)
Ranald Alessio Smedberg  05/17/2020     @PREFPERIOPPHARMACY @   Your procedure is scheduled on  05/22/2020.  Report to [facility] at  [email or web address].  Call this number if you have problems the morning of surgery:  [phone]   Remember:  Do not eat or drink after midnight.                       Take these medicines the morning of surgery with A SIP OF WATER  Amlodipine, carvedilol, valium(if needed), hydrocodone(if needed), levothyroxine, mirabegron(if needed).    Do not wear jewelry, make-up or nail polish.  Do not wear lotions, powders, or perfumes. Please wear deodorant and brush your teeth.  Do not shave 48 hours prior to surgery.  Men may shave face and neck.  Do not bring valuables to the hospital.  [facility] is not responsible for any belongings or valuables.  Contacts, dentures or bridgework may not be worn into surgery.  Leave your suitcase in the car.  After surgery it may be brought to your room.  For patients admitted to the hospital, discharge time will be determined by your treatment team.  Patients discharged the day of surgery will not be allowed to drive home.   Name and phone number of your driver:   family Special instructions:  DO NOT smoke the moorning of your procedure.  Please read over the following fact sheets that you were given. Anesthesia Post-op Instructions and Care and Recovery After Surgery       Cystoscopy Cystoscopy is a procedure that is used to help diagnose and sometimes treat conditions that affect the lower urinary tract. The lower urinary tract includes the bladder and the urethra. The urethra is the tube that drains urine from the bladder. Cystoscopy is done using a thin, tube-shaped instrument with a light and camera at the end (cystoscope). The cystoscope may be hard or flexible, depending on the goal of the procedure. The cystoscope is inserted through the urethra, into the bladder. Cystoscopy may be recommended if you  have:  Urinary tract infections that keep coming back.  Blood in the urine (hematuria).  An inability to control when you urinate (urinary incontinence) or an overactive bladder.  Unusual cells found in a urine sample.  A blockage in the urethra, such as a urinary stone.  Painful urination.  An abnormality in the bladder found during an intravenous pyelogram (IVP) or CT scan. Cystoscopy may also be done to remove a sample of tissue to be examined under a microscope (biopsy). Tell a health care provider about:  Any allergies you have.  All medicines you are taking, including vitamins, herbs, eye drops, creams, and over-the-counter medicines.  Any problems you or family members have had with anesthetic medicines.  Any blood disorders you have.  Any surgeries you have had.  Any medical conditions you have.  Whether you are pregnant or may be pregnant. What are the risks? Generally, this is a safe procedure. However, problems may occur, including:  Infection.  Bleeding.  Allergic reactions to medicines.  Damage to other structures or organs. What happens before the procedure?  Ask your health care provider about: ? Changing or stopping your regular medicines. This is especially important if you are taking diabetes medicines or blood thinners. ? Taking medicines such as aspirin and ibuprofen. These medicines can thin your blood. Do not take these medicines unless your  health care provider tells you to take them. ? Taking over-the-counter medicines, vitamins, herbs, and supplements.  Follow instructions from your health care provider about eating or drinking restrictions.  Ask your health care provider what steps will be taken to help prevent infection. These may include: ? Washing skin with a germ-killing soap. ? Taking antibiotic medicine.  You may have an exam or testing, such as: ? X-rays of the bladder, urethra, or kidneys. ? Urine tests to check for signs of  infection.  Plan to have someone take you home from the hospital or clinic. What happens during the procedure?   You will be given one or more of the following: ? A medicine to help you relax (sedative). ? A medicine to numb the area (local anesthetic).  The area around the opening of your urethra will be cleaned.  The cystoscope will be passed through your urethra into your bladder.  Germ-free (sterile) fluid will flow through the cystoscope to fill your bladder. The fluid will stretch your bladder so that your health care provider can clearly examine your bladder walls.  Your doctor will look at the urethra and bladder. Your doctor may take a biopsy or remove stones.  The cystoscope will be removed, and your bladder will be emptied. The procedure may vary among health care providers and hospitals. What can I expect after the procedure? After the procedure, it is common to have:  Some soreness or pain in your abdomen and urethra.  Urinary symptoms. These include: ? Mild pain or burning when you urinate. Pain should stop within a few minutes after you urinate. This may last for up to 1 week. ? A small amount of blood in your urine for several days. ? Feeling like you need to urinate but producing only a small amount of urine. Follow these instructions at home: Medicines  Take over-the-counter and prescription medicines only as told by your health care provider.  If you were prescribed an antibiotic medicine, take it as told by your health care provider. Do not stop taking the antibiotic even if you start to feel better. General instructions  Return to your normal activities as told by your health care provider. Ask your health care provider what activities are safe for you.  Do not drive for 24 hours if you were given a sedative during your procedure.  Watch for any blood in your urine. If the amount of blood in your urine increases, call your health care provider.  Follow  instructions from your health care provider about eating or drinking restrictions.  If a tissue sample was removed for testing (biopsy) during your procedure, it is up to you to get your test results. Ask your health care provider, or the department that is doing the test, when your results will be ready.  Drink enough fluid to keep your urine pale yellow.  Keep all follow-up visits as told by your health care provider. This is important. Contact a health care provider if you:  Have pain that gets worse or does not get better with medicine, especially pain when you urinate.  Have trouble urinating.  Have more blood in your urine. Get help right away if you:  Have blood clots in your urine.  Have abdominal pain.  Have a fever or chills.  Are unable to urinate. Summary  Cystoscopy is a procedure that is used to help diagnose and sometimes treat conditions that affect the lower urinary tract.  Cystoscopy is done using a thin,  tube-shaped instrument with a light and camera at the end.  After the procedure, it is common to have some soreness or pain in your abdomen and urethra.  Watch for any blood in your urine. If the amount of blood in your urine increases, call your health care provider.  If you were prescribed an antibiotic medicine, take it as told by your health care provider. Do not stop taking the antibiotic even if you start to feel better. This information is not intended to replace advice given to you by your health care provider. Make sure you discuss any questions you have with your health care provider. Document Revised: 09/07/2018 Document Reviewed: 09/07/2018 Elsevier Patient Education  [place].  Ureteral Stent Implantation, Care After This sheet gives you information about how to care for yourself after your procedure. Your health care provider may also give you more specific instructions. If you have problems or questions, contact your health care  provider. What can I expect after the procedure? After the procedure, it is common to have:  Nausea.  Mild pain when you urinate. You may feel this pain in your lower back or lower abdomen. The pain should stop within a few minutes after you urinate. This may last for up to 1 week.  A small amount of blood in your urine for several days. Follow these instructions at home: Medicines  Take over-the-counter and prescription medicines only as told by your health care provider.  If you were prescribed an antibiotic medicine, take it as told by your health care provider. Do not stop taking the antibiotic even if you start to feel better.  Do not drive for 24 hours if you were given a sedative during your procedure.  Ask your health care provider if the medicine prescribed to you requires you to avoid driving or using heavy machinery. Activity  Rest as told by your health care provider.  Avoid sitting for a long time without moving. Get up to take short walks every 1-2 hours. This is important to improve blood flow and breathing. Ask for help if you feel weak or unsteady.  Return to your normal activities as told by your health care provider. Ask your health care provider what activities are safe for you. General instructions   Watch for any blood in your urine. Call your health care provider if the amount of blood in your urine increases.  If you have a catheter: ? Follow instructions from your health care provider about taking care of your catheter and collection bag. ? Do not take baths, swim, or use a hot tub until your health care provider approves. Ask your health care provider if you may take showers. You may only be allowed to take sponge baths.  Drink enough fluid to keep your urine pale yellow.  Do not use any products that contain nicotine or tobacco, such as cigarettes, e-cigarettes, and chewing tobacco. These can delay healing after surgery. If you need help quitting, ask  your health care provider.  Keep all follow-up visits as told by your health care provider. This is important. Contact a health care provider if:  You have pain that gets worse or does not get better with medicine, especially pain when you urinate.  You have difficulty urinating.  You feel nauseous or you vomit repeatedly during a period of more than 2 days after the procedure. Get help right away if:  Your urine is dark red or has blood clots in it.  You  are leaking urine (have incontinence).  The end of the stent comes out of your urethra.  You cannot urinate.  You have sudden, sharp, or severe pain in your abdomen or lower back.  You have a fever.  You have swelling or pain in your legs.  You have difficulty breathing. Summary  After the procedure, it is common to have mild pain when you urinate that goes away within a few minutes after you urinate. This may last for up to 1 week.  Watch for any blood in your urine. Call your health care provider if the amount of blood in your urine increases.  Take over-the-counter and prescription medicines only as told by your health care provider.  Drink enough fluid to keep your urine pale yellow. This information is not intended to replace advice given to you by your health care provider. Make sure you discuss any questions you have with your health care provider. Document Revised: 06/22/2018 Document Reviewed: 06/23/2018 Elsevier Patient Education  2020 [place] Anesthesia, Adult, Care After This sheet gives you information about how to care for yourself after your procedure. Your health care provider may also give you more specific instructions. If you have problems or questions, contact your health care provider. What can I expect after the procedure? After the procedure, the following side effects are common:  Pain or discomfort at the IV site.  Nausea.  Vomiting.  Sore throat.  Trouble  concentrating.  Feeling cold or chills.  Weak or tired.  Sleepiness and fatigue.  Soreness and body aches. These side effects can affect parts of the body that were not involved in surgery. Follow these instructions at home:  For at least 24 hours after the procedure:  Have a responsible adult stay with you. It is important to have someone help care for you until you are awake and alert.  Rest as needed.  Do not: ? Participate in activities in which you could fall or become injured. ? Drive. ? Use heavy machinery. ? Drink alcohol. ? Take sleeping pills or medicines that cause drowsiness. ? Make important decisions or sign legal documents. ? Take care of children on your own. Eating and drinking  Follow any instructions from your health care provider about eating or drinking restrictions.  When you feel hungry, start by eating small amounts of foods that are soft and easy to digest (bland), such as toast. Gradually return to your regular diet.  Drink enough fluid to keep your urine pale yellow.  If you vomit, rehydrate by drinking water, juice, or clear broth. General instructions  If you have sleep apnea, surgery and certain medicines can increase your risk for breathing problems. Follow instructions from your health care provider about wearing your sleep device: ? Anytime you are sleeping, including during daytime naps. ? While taking prescription pain medicines, sleeping medicines, or medicines that make you drowsy.  Return to your normal activities as told by your health care provider. Ask your health care provider what activities are safe for you.  Take over-the-counter and prescription medicines only as told by your health care provider.  If you smoke, do not smoke without supervision.  Keep all follow-up visits as told by your health care provider. This is important. Contact a health care provider if:  You have nausea or vomiting that does not get better with  medicine.  You cannot eat or drink without vomiting.  You have pain that does not get better with medicine.  You  are unable to pass urine.  You develop a skin rash.  You have a fever.  You have redness around your IV site that gets worse. Get help right away if:  You have difficulty breathing.  You have chest pain.  You have blood in your urine or stool, or you vomit blood. Summary  After the procedure, it is common to have a sore throat or nausea. It is also common to feel tired.  Have a responsible adult stay with you for the first 24 hours after general anesthesia. It is important to have someone help care for you until you are awake and alert.  When you feel hungry, start by eating small amounts of foods that are soft and easy to digest (bland), such as toast. Gradually return to your regular diet.  Drink enough fluid to keep your urine pale yellow.  Return to your normal activities as told by your health care provider. Ask your health care provider what activities are safe for you. This information is not intended to replace advice given to you by your health care provider. Make sure you discuss any questions you have with your health care provider. Document Revised: 09/18/2017 Document Reviewed: 05/01/2017 Elsevier Patient Education  [place]. How to Use Chlorhexidine for Bathing Chlorhexidine gluconate (CHG) is a germ-killing (antiseptic) solution that is used to clean the skin. It can get rid of the bacteria that normally live on the skin and can keep them away for about 24 hours. To clean your skin with CHG, you may be given:  A CHG solution to use in the shower or as part of a sponge bath.  A prepackaged cloth that contains CHG. Cleaning your skin with CHG may help lower the risk for infection:  While you are staying in the intensive care unit of the hospital.  If you have a vascular access, such as a central line, to provide short-term or long-term  access to your veins.  If you have a catheter to drain urine from your bladder.  If you are on a ventilator. A ventilator is a machine that helps you breathe by moving air in and out of your lungs.  After surgery. What are the risks? Risks of using CHG include:  A skin reaction.  Hearing loss, if CHG gets in your ears.  Eye injury, if CHG gets in your eyes and is not rinsed out.  The CHG product catching fire. Make sure that you avoid smoking and flames after applying CHG to your skin. Do not use CHG:  If you have a chlorhexidine allergy or have previously reacted to chlorhexidine.  On babies younger than 24 months of age. How to use CHG solution  Use CHG only as told by your health care provider, and follow the instructions on the label.  Use the full amount of CHG as directed. Usually, this is one bottle. During a shower Follow these steps when using CHG solution during a shower (unless your health care provider gives you different instructions): 1. Start the shower. 2. Use your normal soap and shampoo to wash your face and hair. 3. Turn off the shower or move out of the shower stream. 4. Pour the CHG onto a clean washcloth. Do not use any type of brush or rough-edged sponge. 5. Starting at your neck, lather your body down to your toes. Make sure you follow these instructions: ? If you will be having surgery, pay special attention to the part of your body where  you will be having surgery. Scrub this area for at least 1 minute. ? Do not use CHG on your head or face. If the solution gets into your ears or eyes, rinse them well with water. ? Avoid your genital area. ? Avoid any areas of skin that have broken skin, cuts, or scrapes. ? Scrub your back and under your arms. Make sure to wash skin folds. 6. Let the lather sit on your skin for 1-2 minutes or as long as told by your health care provider. 7. Thoroughly rinse your entire body in the shower. Make sure that all body  creases and crevices are rinsed well. 8. Dry off with a clean towel. Do not put any substances on your body afterward--such as powder, lotion, or perfume--unless you are told to do so by your health care provider. Only use lotions that are recommended by the manufacturer. 9. Put on clean clothes or pajamas. 10. If it is the night before your surgery, sleep in clean sheets.  During a sponge bath Follow these steps when using CHG solution during a sponge bath (unless your health care provider gives you different instructions): 1. Use your normal soap and shampoo to wash your face and hair. 2. Pour the CHG onto a clean washcloth. 3. Starting at your neck, lather your body down to your toes. Make sure you follow these instructions: ? If you will be having surgery, pay special attention to the part of your body where you will be having surgery. Scrub this area for at least 1 minute. ? Do not use CHG on your head or face. If the solution gets into your ears or eyes, rinse them well with water. ? Avoid your genital area. ? Avoid any areas of skin that have broken skin, cuts, or scrapes. ? Scrub your back and under your arms. Make sure to wash skin folds. 4. Let the lather sit on your skin for 1-2 minutes or as long as told by your health care provider. 5. Using a different clean, wet washcloth, thoroughly rinse your entire body. Make sure that all body creases and crevices are rinsed well. 6. Dry off with a clean towel. Do not put any substances on your body afterward--such as powder, lotion, or perfume--unless you are told to do so by your health care provider. Only use lotions that are recommended by the manufacturer. 7. Put on clean clothes or pajamas. 8. If it is the night before your surgery, sleep in clean sheets. How to use CHG prepackaged cloths  Only use CHG cloths as told by your health care provider, and follow the instructions on the label.  Use the CHG cloth on clean, dry skin.  Do  not use the CHG cloth on your head or face unless your health care provider tells you to.  When washing with the CHG cloth: ? Avoid your genital area. ? Avoid any areas of skin that have broken skin, cuts, or scrapes. Before surgery Follow these steps when using a CHG cloth to clean before surgery (unless your health care provider gives you different instructions): 1. Using the CHG cloth, vigorously scrub the part of your body where you will be having surgery. Scrub using a back-and-forth motion for 3 minutes. The area on your body should be completely wet with CHG when you are done scrubbing. 2. Do not rinse. Discard the cloth and let the area air-dry. Do not put any substances on the area afterward, such as powder, lotion, or perfume. 3.  Put on clean clothes or pajamas. 4. If it is the night before your surgery, sleep in clean sheets.  For general bathing Follow these steps when using CHG cloths for general bathing (unless your health care provider gives you different instructions). 1. Use a separate CHG cloth for each area of your body. Make sure you wash between any folds of skin and between your fingers and toes. Wash your body in the following order, switching to a new cloth after each step: ? The front of your neck, shoulders, and chest. ? Both of your arms, under your arms, and your hands. ? Your stomach and groin area, avoiding the genitals. ? Your right leg and foot. ? Your left leg and foot. ? The back of your neck, your back, and your buttocks. 2. Do not rinse. Discard the cloth and let the area air-dry. Do not put any substances on your body afterward--such as powder, lotion, or perfume--unless you are told to do so by your health care provider. Only use lotions that are recommended by the manufacturer. 3. Put on clean clothes or pajamas. Contact a health care provider if:  Your skin gets irritated after scrubbing.  You have questions about using your solution or cloth. Get  help right away if:  Your eyes become very red or swollen.  Your eyes itch badly.  Your skin itches badly and is red or swollen.  Your hearing changes.  You have trouble seeing.  You have swelling or tingling in your mouth or throat.  You have trouble breathing.  You swallow any chlorhexidine. Summary  Chlorhexidine gluconate (CHG) is a germ-killing (antiseptic) solution that is used to clean the skin. Cleaning your skin with CHG may help to lower your risk for infection.  You may be given CHG to use for bathing. It may be in a bottle or in a prepackaged cloth to use on your skin. Carefully follow your health care provider's instructions and the instructions on the product label.  Do not use CHG if you have a chlorhexidine allergy.  Contact your health care provider if your skin gets irritated after scrubbing. This information is not intended to replace advice given to you by your health care provider. Make sure you discuss any questions you have with your health care provider. Document Revised: 12/02/2018 Document Reviewed: 08/13/2017 Elsevier Patient Education  [place].

## 2020-05-21 ENCOUNTER — Other Ambulatory Visit (HOSPITAL_COMMUNITY)
Admission: RE | Admit: 2020-05-21 | Discharge: 2020-05-21 | Disposition: A | Discharge status: Home / Self Care | Payer: Medicare Other | Source: Ambulatory Visit | Attending: Urology | Admitting: Urology

## 2020-05-21 ENCOUNTER — Other Ambulatory Visit: Payer: Self-pay

## 2020-05-21 ENCOUNTER — Encounter (HOSPITAL_COMMUNITY)
Admission: RE | Admit: 2020-05-21 | Discharge: 2020-05-21 | Disposition: A | Discharge status: Home / Self Care | Payer: Medicare Other | Source: Ambulatory Visit | Attending: Urology | Admitting: Urology

## 2020-05-21 DIAGNOSIS — Z01812 Encounter for preprocedural laboratory examination: Secondary | ICD-10-CM | POA: Insufficient documentation

## 2020-05-21 DIAGNOSIS — Z20822 Contact with and (suspected) exposure to covid-19: Secondary | ICD-10-CM | POA: Diagnosis not present

## 2020-05-21 LAB — SARS CORONAVIRUS 2 (TAT 6-24 HRS): SARS Coronavirus 2: NEGATIVE

## 2020-05-22 ENCOUNTER — Encounter (HOSPITAL_COMMUNITY)
Admission: RE | Disposition: A | Discharge status: Home / Self Care | Payer: Self-pay | Source: Home / Self Care | Attending: Urology

## 2020-05-22 ENCOUNTER — Ambulatory Visit (HOSPITAL_COMMUNITY): Payer: Medicare Other | Admitting: Anesthesiology

## 2020-05-22 ENCOUNTER — Encounter (HOSPITAL_COMMUNITY): Payer: Self-pay | Admitting: Urology

## 2020-05-22 ENCOUNTER — Ambulatory Visit (HOSPITAL_COMMUNITY): Payer: Medicare Other

## 2020-05-22 ENCOUNTER — Ambulatory Visit (HOSPITAL_COMMUNITY)
Admission: RE | Admit: 2020-05-22 | Discharge: 2020-05-22 | Disposition: A | Discharge status: Home / Self Care | Payer: Medicare Other | Attending: Urology | Admitting: Urology

## 2020-05-22 DIAGNOSIS — Z79899 Other long term (current) drug therapy: Secondary | ICD-10-CM | POA: Insufficient documentation

## 2020-05-22 DIAGNOSIS — G4733 Obstructive sleep apnea (adult) (pediatric): Secondary | ICD-10-CM | POA: Insufficient documentation

## 2020-05-22 DIAGNOSIS — N210 Calculus in bladder: Secondary | ICD-10-CM | POA: Diagnosis not present

## 2020-05-22 DIAGNOSIS — I252 Old myocardial infarction: Secondary | ICD-10-CM | POA: Insufficient documentation

## 2020-05-22 DIAGNOSIS — J8410 Pulmonary fibrosis, unspecified: Secondary | ICD-10-CM | POA: Insufficient documentation

## 2020-05-22 DIAGNOSIS — Z9359 Other cystostomy status: Secondary | ICD-10-CM | POA: Diagnosis not present

## 2020-05-22 DIAGNOSIS — I6529 Occlusion and stenosis of unspecified carotid artery: Secondary | ICD-10-CM | POA: Diagnosis not present

## 2020-05-22 DIAGNOSIS — G35 Multiple sclerosis: Secondary | ICD-10-CM | POA: Insufficient documentation

## 2020-05-22 DIAGNOSIS — N202 Calculus of kidney with calculus of ureter: Secondary | ICD-10-CM | POA: Diagnosis not present

## 2020-05-22 DIAGNOSIS — I2510 Atherosclerotic heart disease of native coronary artery without angina pectoris: Secondary | ICD-10-CM | POA: Insufficient documentation

## 2020-05-22 DIAGNOSIS — Z87891 Personal history of nicotine dependence: Secondary | ICD-10-CM | POA: Insufficient documentation

## 2020-05-22 DIAGNOSIS — I509 Heart failure, unspecified: Secondary | ICD-10-CM | POA: Diagnosis not present

## 2020-05-22 DIAGNOSIS — N201 Calculus of ureter: Secondary | ICD-10-CM

## 2020-05-22 DIAGNOSIS — I110 Hypertensive heart disease with heart failure: Secondary | ICD-10-CM | POA: Diagnosis not present

## 2020-05-22 DIAGNOSIS — I739 Peripheral vascular disease, unspecified: Secondary | ICD-10-CM | POA: Diagnosis not present

## 2020-05-22 DIAGNOSIS — I69354 Hemiplegia and hemiparesis following cerebral infarction affecting left non-dominant side: Secondary | ICD-10-CM | POA: Insufficient documentation

## 2020-05-22 DIAGNOSIS — G4730 Sleep apnea, unspecified: Secondary | ICD-10-CM | POA: Insufficient documentation

## 2020-05-22 HISTORY — PX: NEPHROSTOMY TUBE REMOVAL: SHX6794

## 2020-05-22 HISTORY — PX: CYSTOSCOPY/URETEROSCOPY/HOLMIUM LASER/STENT PLACEMENT: SHX6546

## 2020-05-22 HISTORY — PX: INSERTION OF SUPRAPUBIC CATHETER: SHX5870

## 2020-05-22 SURGERY — CYSTOSCOPY/URETEROSCOPY/HOLMIUM LASER/STENT PLACEMENT
Anesthesia: General | Laterality: Left

## 2020-05-22 MED ORDER — FENTANYL CITRATE (PF) 100 MCG/2ML IJ SOLN
INTRAMUSCULAR | Status: DC | PRN
Start: 2020-05-22 — End: 2020-05-22
  Administered 2020-05-22 (×3): 25 ug via INTRAVENOUS

## 2020-05-22 MED ORDER — GLYCOPYRROLATE 0.2 MG/ML IJ SOLN
INTRAMUSCULAR | Status: DC | PRN
  Administered 2020-05-22 (×2): .1 mg via INTRAVENOUS

## 2020-05-22 MED ORDER — LACTATED RINGERS IV SOLN: INTRAVENOUS | Status: DC

## 2020-05-22 MED ORDER — DIATRIZOATE MEGLUMINE 30 % UR SOLN
URETHRAL | Status: AC
  Filled 2020-05-22: qty 100

## 2020-05-22 MED ORDER — EPHEDRINE SULFATE 50 MG/ML IJ SOLN
INTRAMUSCULAR | Status: DC | PRN
  Administered 2020-05-22: 10 mg via INTRAVENOUS
  Administered 2020-05-22 (×2): 5 mg via INTRAVENOUS
  Administered 2020-05-22: 10 mg via INTRAVENOUS

## 2020-05-22 MED ORDER — LIDOCAINE HCL (CARDIAC) PF 100 MG/5ML IV SOSY
PREFILLED_SYRINGE | INTRAVENOUS | Status: DC | PRN
  Administered 2020-05-22: 100 mg via INTRAVENOUS

## 2020-05-22 MED ORDER — ORAL CARE MOUTH RINSE: 15.0000 mL | Freq: Once | OROMUCOSAL | Status: AC

## 2020-05-22 MED ORDER — FENTANYL CITRATE (PF) 100 MCG/2ML IJ SOLN: 25.0000 ug | INTRAMUSCULAR | Status: DC | PRN

## 2020-05-22 MED ORDER — SODIUM CHLORIDE 0.9 % IR SOLN
Status: DC | PRN
  Administered 2020-05-22: 3000 mL

## 2020-05-22 MED ORDER — CHLORHEXIDINE GLUCONATE 0.12 % MT SOLN
15.0000 mL | Freq: Once | OROMUCOSAL | Status: AC
  Administered 2020-05-22: 15 mL via OROMUCOSAL
  Filled 2020-05-22: qty 15

## 2020-05-22 MED ORDER — PHENYLEPHRINE HCL (PRESSORS) 10 MG/ML IV SOLN
INTRAVENOUS | Status: DC | PRN
  Administered 2020-05-22 (×5): 40 ug via INTRAVENOUS

## 2020-05-22 MED ORDER — LACTATED RINGERS IV SOLN: INTRAVENOUS | Status: DC | PRN

## 2020-05-22 MED ORDER — FENTANYL CITRATE (PF) 100 MCG/2ML IJ SOLN
INTRAMUSCULAR | Status: AC
  Filled 2020-05-22: qty 2

## 2020-05-22 MED ORDER — CEFAZOLIN SODIUM-DEXTROSE 2-4 GM/100ML-% IV SOLN
2.0000 g | INTRAVENOUS | Status: AC
  Administered 2020-05-22: 2 g via INTRAVENOUS
  Filled 2020-05-22: qty 100

## 2020-05-22 MED ORDER — ONDANSETRON HCL 4 MG/2ML IJ SOLN
INTRAMUSCULAR | Status: DC | PRN
  Administered 2020-05-22: 4 mg via INTRAVENOUS

## 2020-05-22 MED ORDER — DIATRIZOATE MEGLUMINE 30 % UR SOLN
URETHRAL | Status: DC | PRN
  Administered 2020-05-22: 6 mL via URETHRAL

## 2020-05-22 MED ORDER — ONDANSETRON HCL 4 MG/2ML IJ SOLN: 4.0000 mg | Freq: Once | INTRAMUSCULAR | Status: DC | PRN

## 2020-05-22 MED ORDER — PROPOFOL 10 MG/ML IV BOLUS
INTRAVENOUS | Status: DC | PRN
  Administered 2020-05-22 (×2): 20 mg via INTRAVENOUS
  Administered 2020-05-22: 100 mg via INTRAVENOUS

## 2020-05-22 SURGICAL SUPPLY — 25 items
BAG DRAIN URO TABLE W/ADPT NS (BAG) ×4 IMPLANT
BAG DRN 8 ADPR NS SKTRN CSTL (BAG) ×2
CATH FOLEY 2WAY SLVR  5CC 24FR (CATHETERS) ×4
CATH FOLEY 2WAY SLVR 5CC 24FR (CATHETERS) IMPLANT
CATH INTERMIT  6FR 70CM (CATHETERS) ×4 IMPLANT
CLOTH BEACON ORANGE TIMEOUT ST (SAFETY) ×4 IMPLANT
EXTRACTOR STONE NITINOL NGAGE (UROLOGICAL SUPPLIES) ×4 IMPLANT
FIBER LASER FLEXIVA 200 (UROLOGICAL SUPPLIES) IMPLANT
FIBER LASER FLEXIVA 365 (UROLOGICAL SUPPLIES) IMPLANT
GLOVE BIOGEL M 8.0 STRL (GLOVE) ×4 IMPLANT
GOWN STRL REUS W/TWL LRG LVL3 (GOWN DISPOSABLE) ×4 IMPLANT
GOWN STRL REUS W/TWL XL LVL3 (GOWN DISPOSABLE) ×4 IMPLANT
GUIDEWIRE ANG ZIPWIRE 038X150 (WIRE) IMPLANT
GUIDEWIRE STR DUAL SENSOR (WIRE) ×4 IMPLANT
IV NS IRRIG 3000ML ARTHROMATIC (IV SOLUTION) ×4 IMPLANT
KIT TURNOVER CYSTO (KITS) ×4 IMPLANT
MANIFOLD NEPTUNE II (INSTRUMENTS) ×4 IMPLANT
NS IRRIG 500ML POUR BTL (IV SOLUTION) ×4 IMPLANT
PACK CYSTO (CUSTOM PROCEDURE TRAY) ×4 IMPLANT
PAD ARMBOARD 7.5X6 YLW CONV (MISCELLANEOUS) ×4 IMPLANT
SHEATH URET ACCESS 12FR/35CM (UROLOGICAL SUPPLIES) ×2 IMPLANT
SHEATH URETERAL 12FRX35CM (MISCELLANEOUS) ×2 IMPLANT
SYR TOOMEY IRRIG 70ML (MISCELLANEOUS) ×4
SYRINGE TOOMEY IRRIG 70ML (MISCELLANEOUS) IMPLANT
TOWEL OR 17X26 4PK STRL BLUE (TOWEL DISPOSABLE) ×4 IMPLANT

## 2020-05-22 NOTE — Anesthesia Postprocedure Evaluation (Signed)
Anesthesia Post Note  Patient: Jordan Ward  Procedure(s) Performed: CYSTOSCOPY/URETEROSCOPY/STENT PLACEMENT (Left ) SUPRAPUBIC CATHETER EXCHANGE NEPHROSTOMY TUBE REMOVAL  Patient location during evaluation: PACU Anesthesia Type: General Level of consciousness: awake Pain management: pain level controlled Vital Signs Assessment: post-procedure vital signs reviewed and stable Respiratory status: spontaneous breathing Cardiovascular status: blood pressure returned to baseline Postop Assessment: no headache Anesthetic complications: no   No complications documented.   Last Vitals:  Vitals:   05/22/20 1405 05/22/20 1411  BP:  115/77  Pulse: 61   Resp: 16   Temp: (!) 36.3 C   SpO2: 95%     Last Pain:  Vitals:   05/22/20 1405  TempSrc: Oral  PainSc: 0-No pain                 Louann Sjogren

## 2020-05-22 NOTE — Transfer of Care (Signed)
Immediate Anesthesia Transfer of Care Note  Patient: Jordan Ward  Procedure(s) Performed: CYSTOSCOPY/URETEROSCOPY/STENT PLACEMENT (Left ) SUPRAPUBIC CATHETER EXCHANGE NEPHROSTOMY TUBE REMOVAL  Patient Location: PACU  Anesthesia Type:General  Level of Consciousness: awake, alert , oriented and patient cooperative  Airway & Oxygen Therapy: Patient Spontanous Breathing and Patient connected to nasal cannula oxygen  Post-op Assessment: Report given to RN and Post -op Vital signs reviewed and stable  Post vital signs: Reviewed and stable  Last Vitals:  Vitals Value Taken Time  BP    Temp    Pulse 66 05/22/20 1329  Resp 11 05/22/20 1329  SpO2 100 % 05/22/20 1329  Vitals shown include unvalidated device data.  Last Pain:  Vitals:   05/22/20 1107  PainSc: 0-No pain         Complications: No complications documented.

## 2020-05-22 NOTE — Anesthesia Preprocedure Evaluation (Addendum)
Anesthesia Evaluation  Patient identified by MRN, date of birth, ID band Patient awake    Reviewed: Allergy & Precautions, NPO status , Patient's Chart, lab work & pertinent test results, reviewed documented beta blocker date and time   Airway Mallampati: II  TM Distance: >3 FB Neck ROM: Full    Dental  (+) Poor Dentition, Dental Advisory Given, Missing   Pulmonary sleep apnea , pneumonia, former smoker,    Pulmonary exam normal breath sounds clear to auscultation       Cardiovascular Exercise Tolerance: Poor hypertension, Pt. on medications and Pt. on home beta blockers + CAD, + Past MI, + Peripheral Vascular Disease and +CHF  Normal cardiovascular exam+ dysrhythmias (PAT)  Rhythm:Regular Rate:Normal  Left ventricle: The cavity size was normal. Difonzo thickness was increased in a pattern of mild LVH. Systolic function was normal.  The estimated ejection fraction was in the range of 50% to 55%.  There is hypokinesis of the basalinferior myocardium. Doppler parameters are consistent with abnormal left ventricular relaxation (grade 1 diastolic dysfunction).  - Aortic valve: A bicuspid morphology cannot be excluded. Moderate  thickening and calcification involving the noncoronary cusp.  Valve mobility was restricted. Mean gradient (S): 4 mm Hg. Peak  gradient (S): 10 mm Hg. VTI ratio of LVOT to aortic valve: 0.61.  Valve area (Vmax): 1.98 cm^2.  - Mitral valve: There was mild regurgitation.  - Left atrium: The atrium was mildly dilated.  - Right atrium: Central venous pressure (est): 3 mm Hg.  - Atrial septum: No defect or patent foramen ovale was identified.  - Tricuspid valve: There was mild regurgitation.  - Pulmonary arteries: PA peak pressure: 39 mm Hg (S).  - Pericardium, extracardiac: There was no pericardial effusion.  22-Jul-2018 13:57:42 [facility]-300 ROUTINE RECORD Sinus bradycardia Left  ventricular hypertrophy with repolarization abnormality Abnormal ECG Confirmed by Sinda Du ([id number]) on 07/26/2018 12:31:27 PM    Neuro/Psych  Headaches, PSYCHIATRIC DISORDERS TIA Neuromuscular disease (multiple sclerosis) CVA, Residual Symptoms    GI/Hepatic negative GI ROS, CBD stone, S/P ERCP   Endo/Other  Hypothyroidism   Renal/GU ARF and Renal InsufficiencyRenal disease     Musculoskeletal  (+) Arthritis ,   Abdominal   Peds  Hematology  (+) anemia ,   Anesthesia Other Findings 18-Apr-2020 13:03:26 [place] [facility]-OPS ROUTINE RECORD Normal sinus rhythm Left ventricular hypertrophy with QRS widening and repolarization abnormality ( Sokolow-Lyon , Romhilt-Estes ) Cannot rule out Inferior infarct , age undetermined Abnormal ECG Confirmed by Sherren Mocha [phone]) on 04/19/2020 7:12:00 AM  Reproductive/Obstetrics                             Anesthesia Physical  Anesthesia Plan  ASA: IV  Anesthesia Plan: General   Post-op Pain Management:    Induction: Intravenous  PONV Risk Score and Plan: 3  Airway Management Planned: LMA  Additional Equipment:   Intra-op Plan:   Post-operative Plan: Extubation in OR  Informed Consent: I have reviewed the patients History and Physical, chart, labs and discussed the procedure including the risks, benefits and alternatives for the proposed anesthesia with the patient or authorized representative who has indicated his/her understanding and acceptance.     Dental advisory given  Plan Discussed with: Surgeon  Anesthesia Plan Comments:        Anesthesia Quick Evaluation

## 2020-05-22 NOTE — Anesthesia Procedure Notes (Signed)
Procedure Name: LMA Insertion Date/Time: 05/22/2020 12:28 PM Performed by: Gayland Curry, CRNA Pre-anesthesia Checklist: Patient identified, Emergency Drugs available, Suction available and Patient being monitored Patient Re-evaluated:Patient Re-evaluated prior to induction Oxygen Delivery Method: Circle system utilized Preoxygenation: Pre-oxygenation with 100% oxygen Induction Type: IV induction Ventilation: Mask ventilation without difficulty LMA: LMA inserted LMA Size: 4.0 Number of attempts: 1 Tube secured with: Tape Dental Injury: Teeth and Oropharynx as per pre-operative assessment

## 2020-05-22 NOTE — Discharge Instructions (Signed)
1. You may see some blood in the urine and may have some burning with urination for 48-72 hours. You also may notice that you have to urinate more frequently or urgently after your procedure which is normal.  °2. You should call should you develop an inability urinate, fever > 101, persistent nausea and vomiting that prevents you from eating or drinking to stay hydrated.  °3. If you have a stent, you will likely urinate more frequently and urgently until the stent is removed and you may experience some discomfort/pain in the lower abdomen and flank especially when urinating. You may take pain medication prescribed to you if needed for pain. You may also intermittently have blood in the urine until the stent is removed. °4. If you have a catheter, you will be taught how to take care of the catheter by the nursing staff prior to discharge from the hospital.  You may periodically feel a strong urge to void with the catheter in place.  This is a bladder spasm and most often can occur when having a bowel movement or moving around. It is typically self-limited and usually will stop after a few minutes.  You may use some Vaseline or Neosporin around the tip of the catheter to reduce friction at the tip of the penis. You may also see some blood in the urine.  A very small amount of blood can make the urine look quite red.  As long as the catheter is draining well, there usually is not a problem.  However, if the catheter is not draining well and is bloody, you should call the office ([phone]) to notify us. °

## 2020-05-22 NOTE — Interval H&P Note (Signed)
History and Physical Interval Note:  05/22/2020 12:00 PM  Jordan Ward  has presented today for surgery, with the diagnosis of left ureteral calculus.  The various methods of treatment have been discussed with the patient and family. After consideration of risks, benefits and other options for treatment, the patient has consented to  Procedure(s): CYSTOSCOPY/URETEROSCOPY/HOLMIUM LASER/STENT PLACEMENT (Left) as a surgical intervention.  The patient's history has been reviewed, patient examined, no change in status, stable for surgery.  I have reviewed the patient's chart and labs.  Questions were answered to the patient's satisfaction.     Lillette Boxer Khamari Yousuf

## 2020-05-22 NOTE — Op Note (Signed)
Preoperative diagnosis: Left mid ureteral and left lower pole renal calculi  Postoperative diagnosis: Same, bladder calculi (largest size 5 mm)  Principal procedure: Cystoscopy, left retrograde ureteropyelogram, fluoroscopic interpretation, left ureteroscopy, extraction of left ureteral and renal calculi, removal of multiple bladder calculi (largest size 5 mm), removal of left nephrostomy tube, exchange of suprapubic catheter (22 French Foley), placement of 24 x 6 French contour double-J stent without tether  Surgeon: Porschea Borys  Anesthesia: General with LMA  Complications: None  Specimen: Stone fragments  Drains: 58 Foley for suprapubic tube, above-mentioned stent  Indications: 79 year old male with MS and subsequent neurogenic bladder managed with suprapubic tube.  The patient has a history of recurrent urolithiasis-both in the bladder and ureteral.  He recently presented with an infected left ureteral stone and underwent urgent percutaneous tube placement on the fourth of this month.  His infection was treated.  He presents at this time for definitive ureteroscopic management of his left ureteral stone.  In addition, he has left renal calculi in the lower pole, measuring approximately 9 mm in size.  I discussed ureteroscopic management of the above with the patient.  I have discussed the risks and complications including but not limited to infections, ureteral trauma, renal trauma, anesthetic complications among others.  He understands and desires to proceed.  Findings: Bladder urothelium was normal.  Ureteral orifice ease were normal in their location but had somewhat of a wide configuration.  There were multiple bladder calculi layering posteriorly, the largest of which was 5 mm in size.  Retrograde study of the left ureter revealed mild J hooking of the distal ureter.  There was a filling defect in the mid ureter without hydronephrosis.  There was not enough contrast injected to perform a  pyelogram, but he did have an active percutaneous tube so I did not expect to see the pelvis or calyceal system that well.  Description of procedure: The patient was properly identified and marked in the holding area.  He received preoperative IV Ancef and then taken to the operating room where general anesthetic was administered with the LMA.  He was placed in the dorsolithotomy position.  Genitalia and perineum were prepped and draped, proper timeout was performed.  7 French panendoscope was advanced under direct vision and was bladder with cursory systematic exam revealing normal urothelium.  The left ureter was cannulated with a 6 [place] open-ended catheter and retrograde study was performed with Omnipaque with the above-mentioned findings.  Following this, guidewire advanced into the left renal pelvis through the open-ended catheter and the open-ended catheter was removed.  Additionally, cystoscope was removed.  The ureter was dilated first with the obturator and then with the entire 12/14 ureteral access catheter over the guidewire.  I then remove the access catheter, leaving the safety wire.  Semirigid dual-lumen short ureteroscope was then advanced under direct vision up to the stones located in the proximal ureter.  These were somewhat friable/soft, easily grasped with the engage basket and easily extracted and placed into the bladder.  Further inspection revealed no further ureteral calculi.  Over top of the guidewire I then placed the ureteral access catheter, remove the obturator and the guidewire.  Flexible ureteroscope was then advanced into the pyelocalyceal system.  There were multiple small fragments of stone in the lower pole calyx.  These were easily grasped and extracted through the access catheter.  Tiny fragments could not be grasped and were left indwelling.  However, any sizable fragment was easily removed.  Thorough inspection  of the pyelocalyceal system revealed no further stone  matter.  At this point, the ureteroscope was removed.  The guidewire was replaced through the access catheter which was then removed.  The cystoscope was placed within the bladder and the multiple fragments were either directly drained or irrigated free from the bladder.  These were saved for specimen.  I then passed a 6 [place] by 24 cm contour double-J stent easily over top of the guidewire with excellent proximal distal curl seen once the guidewire was removed.  At this point the scope was removed.  The procedure was then terminated after his suprapubic tube was changed with another 75 [place] Foley catheter with the balloon filled with 10 cc of water.  This was hooked to dependent drainage.  I then removed the nephrostomy tube and placed a sterile dressing.  At this point the procedure was terminated.  The patient was awakened and taken to the PACU in stable condition having tolerated the procedure well.

## 2020-05-22 NOTE — H&P (Signed)
H&P  Chief Complaint: Kidney stone  History of Present Illness: 79 yo male presents for left URS/HLL/extraction of a stone. He has a left ureteral stone and is s/p urgent left pc tube placement for obstruction/infection on 8.4.2021. He has continued on course of abx and prsents for definitive mgmt.  Past Medical History:  Diagnosis Date  . Anemia   . Arthritis   . Back pain, chronic   . Bilateral carotid bruits   . C. difficile colitis 09/2011  . CAD (coronary artery disease)   . Carotid artery stenosis   . Cerebrovascular disease   . Cerebrovascular disease 08/17/2018  . Colon polyps   . Complication of cystostomy catheter, initial encounter ([place]) 04/20/2020  . Dyslipidemia   . Dysphagia 10/07/2011  . Encephalopathy   . Gait disorder   . HA (headache)   . High grade dysplasia in colonic adenoma 09/2005  . History of kidney stones   . HTN (hypertension), malignant 10/06/2011  . Hypernatremia   . Hypokalemia   . Hypothyroidism 10/08/2011  . Insomnia   . Junctional rhythm   . Kidney stones   . MS (multiple sclerosis) ([place])   . Neuromuscular disorder (H[facility])    MS  . OSA (obstructive sleep apnea)   . Paroxysmal atrial tachycardia ([place])   . Peripheral vascular disease ([place])   . Pneumonia 4 yrs ago  . Pulmonary fibrosis ([place]) 10/06/2011  . Pulmonary nodule 10/08/2011  . PVD (peripheral vascular disease) ([place])   . Sacral ulcer ([place])   . Sleep apnea    cannot tolerate  . Stroke [facility])    left sided weakness  . Suprapubic catheter ([place])   . TIA (transient ischemic attack)   . Tremors of nervous system 10/08/2011  . Urinary tract infection   . Ventral hernia without obstruction or gangrene     Large 8X9cm ventral hernia with loss of domain. CT reads report as diastasis recti with herniation or diastasis recti.  Dr. Constance Haw, Surgery, reviewed CT with radiology and there is herniation with only hernia sac or peritoneum over the bowel and large separation of the rectus muslce (i.e. diastasis  recti aka loss of domain).  No surgical intervention recommended given size, age, and health.     Past Surgical History:  Procedure Laterality Date  . APPENDECTOMY  09/2005   at time of left hemicolectomy  . BACK SURGERY  1976/1979   lower  . BILIARY DILATION N/A 03/03/2020   Procedure: BILIARY DILATION;  Surgeon: Rogene Houston, MD;  Location[facility];  Service: Endoscopy;  Laterality: N/A;  . CATARACT EXTRACTION W/PHACO Right 03/08/2018   Procedure: CATARACT EXTRACTION PHACO AND INTRAOCULAR LENS PLACEMENT RIGHT EYE;  Surgeon: Tonny Branch, MD;  Location: [facility];  Service: Ophthalmology;  Laterality: Right;  CDE: 8.86  . CATARACT EXTRACTION W/PHACO Left 04/05/2018   Procedure: CATARACT EXTRACTION PHACO AND INTRAOCULAR LENS PLACEMENT (IOC);  Surgeon: Tonny Branch, MD;  Location: [facility];  Service: Ophthalmology;  Laterality: Left;  CDE: 7.36  . CHOLECYSTECTOMY     Dr. Tamala Julian  . COLON SURGERY  09/2005   [place]man: four tubular adenomas, large adenomatous polyp with HIGH GRADE dysplasia  . COLONOSCOPY  11/2004   Dr. Sharol Roussel sessile polyp splenic flexure, 49mm sessile polyp desc colon, tubulovillous adenoma (bx not removed)  . COLONOSCOPY  01/2005   poor prep, polyp could not be found  . COLONOSCOPY  05/2005   with EMR, polypectomy Dr. Olegario Messier, bx showed high grade dysplasia, partially resected  .  COLONOSCOPY  09/2005   Dr. Arsenio Loader, [place] ink tattooing, four villous colon polyp (3 had been missed on previous colonoscopies due to limitations of procedures  . COLONOSCOPY  09/2006   normal TI, no polyps  . COLONOSCOPY  10/2007   Dr. Imogene Burn distal mammillations, benign bx, normal TI, random bx neg for microscopic colitis  . CYSTOSCOPY WITH LITHOLAPAXY N/A 07/27/2018   Procedure: CYSTOSCOPY WITH LITHOLAPAXY VIA  SUPRAPUBIC TUBE;  Surgeon: Franchot Gallo, MD;  Location: [facility];  Service: Urology;  Laterality: N/A;  . CYSTOSCOPY WITH RETROGRADE PYELOGRAM, URETEROSCOPY AND STENT  PLACEMENT Left 06/09/2017   Procedure: CYSTOSCOPY WITH LEFT RETROGRADE PYELOGRAM, LEFT URETEROSCOPY, LEFT URETEROSCOPIC STONE EXTRACTION, LEFT URETERAL STENT PLACEMENT;  Surgeon: Franchot Gallo, MD;  Location: [facility];  Service: Urology;  Laterality: Left;  . ERCP N/A 03/03/2020   Procedure: ENDOSCOPIC RETROGRADE CHOLANGIOPANCREATOGRAPHY (ERCP);  Surgeon: Rogene Houston, MD;  Location[facility];  Service: Endoscopy;  Laterality: N/A;  . ERCP N/A 04/20/2020   Procedure: ENDOSCOPIC RETROGRADE CHOLANGIOPANCREATOGRAPHY (ERCP);  Surgeon: Rogene Houston, MD;  Location[facility];  Service: Endoscopy;  Laterality: N/A;  to be done at 7:30am in OR  . GASTROINTESTINAL STENT REMOVAL N/A 04/20/2020   Procedure: STENT REMOVAL;  Surgeon: Rogene Houston, MD;  Location[facility];  Service: Endoscopy;  Laterality: N/A;  . INGUINAL HERNIA REPAIR  1971   bilateral  . INSERTION OF SUPRAPUBIC CATHETER  06/09/2017   Procedure: EXCHANGE OF SUPRAPUBIC CATHETER;  Surgeon: Franchot Gallo, MD;  Location: [facility];  Service: Urology;;  . IR NEPHROSTOMY PLACEMENT LEFT  05/26/2017  . IR NEPHROSTOMY PLACEMENT LEFT  05/02/2020  . KIDNEY STONE SURGERY  09/13/2015  . LITHOTRIPSY N/A 03/03/2020   Procedure: MECHANICAL LITHOTRIPSY WITH REMOVAL OF MULTIPLE STONE FRAGMENTS;  Surgeon: Rogene Houston, MD;  Location[facility];  Service: Endoscopy;  Laterality: N/A;  . NEPHROLITHOTOMY Left 09/13/2015   Procedure: LEFT PERCUTANEOUS NEPHROLITHOTOMY ;  Surgeon: Franchot Gallo, MD;  Location: [facility];  Service: Urology;  Laterality: Left;  . REMOVAL OF STONES N/A 04/20/2020   Procedure: REMOVAL OF STONES;  Surgeon: Rogene Houston, MD;  [facility];  Service: Endoscopy;  Laterality: N/A;  . SPHINCTEROTOMY  03/03/2020   Procedure: BILLARY SPHINCTEROTOMY;  Surgeon: Rogene Houston, MD;  Location[facility];  Service: Endoscopy;;  . SUPRAPUBIC CATHETER INSERTION      Home Medications:  Allergies as  of 05/22/2020      Reactions   Tetracyclines & Related Anaphylaxis, Rash   Ciprofloxacin    Trouble swallowing unknown reaction according to wife       Medication List    Notice   Cannot display discharge medications because the patient has not yet been admitted.     Allergies:  Allergies  Allergen Reactions  . Tetracyclines & Related Anaphylaxis and Rash  . Ciprofloxacin     Trouble swallowing unknown reaction according to wife     Family History  Problem Relation Age of Onset  . Cirrhosis Brother        etoh  . Stroke Mother 32  . Coronary artery disease Father 58  . Heart attack Brother   . Cancer Sister   . Multiple sclerosis Other   . Colon cancer Neg Hx     Social History:  reports that he quit smoking about 32 years ago. His smoking use included cigarettes. He has a 25.00 pack-year smoking history. He has never used smokeless tobacco.  He reports that he does not drink alcohol and does not use drugs.  ROS: A complete review of systems was performed.  All systems are negative except for pertinent findings as noted.  Physical Exam:  Vital signs in last 24 hours: There were no vitals taken for this visit. Constitutional:  Alert and oriented, No acute distress Cardiovascular: Regular rate  Respiratory: Normal respiratory effort GI: Abdomen is soft, nontender, nondistended, no abdominal masses. No CVAT.  Genitourinary: Normal male phallus, testes are descended bilaterally and non-tender and without masses, scrotum is normal in appearance without lesions or masses, perineum is normal on inspection. Lymphatic: No lymphadenopathy Neurologic: Grossly intact, no focal deficits Psychiatric: Normal mood and affect  Laboratory Data:  No results for input(s): WBC, HGB, HCT, PLT in the last 72 hours.  No results for input(s): NA, K, CL, GLUCOSE, BUN, CALCIUM, CREATININE in the last 72 hours.  Invalid input(s): CO3   Results for orders placed or performed during the  hospital encounter of 05/21/20 (from the past 24 hour(s))  SARS CORONAVIRUS 2 (TAT 6-24 HRS) Nasopharyngeal Nasopharyngeal Swab     Status: None   Collection Time: 05/21/20  9:10 AM   Specimen: Nasopharyngeal Swab  Result Value Ref Range   SARS Coronavirus 2 NEGATIVE NEGATIVE   Recent Results (from the past 240 hour(s))  SARS CORONAVIRUS 2 (TAT 6-24 HRS) Nasopharyngeal Nasopharyngeal Swab     Status: None   Collection Time: 05/21/20  9:10 AM   Specimen: Nasopharyngeal Swab  Result Value Ref Range Status   SARS Coronavirus 2 NEGATIVE NEGATIVE Final    Comment: (NOTE) SARS-CoV-2 target nucleic acids are NOT DETECTED.  The SARS-CoV-2 RNA is generally detectable in upper and lower respiratory specimens during the acute phase of infection. Negative results do not preclude SARS-CoV-2 infection, do not rule out co-infections with other pathogens, and should not be used as the sole basis for treatment or other patient management decisions. Negative results must be combined with clinical observations, patient history, and epidemiological information. The expected result is Negative.  Fact Sheet for Patients: SugarRoll.be  Fact Sheet for Healthcare Providers: https://www.woods-mathews.com/  This test is not yet approved or cleared by the [place] FDA and  has been authorized for detection and/or diagnosis of SARS-CoV-2 by FDA under an Emergency Use Authorization (EUA). This EUA will remain  in effect (meaning this test can be used) for the duration of the COVID-19 declaration under Se ction 564(b)(1) of the Act, 21 U.S.C. section 360bbb-3(b)(1), unless the authorization is terminated or revoked sooner.  Performed at [facility], [place] [address] [place], [place] [address]     Renal Function: No results for input(s): CREATININE in the last 168 hours. CrCl cannot be calculated (Unknown ideal weight.).  Radiologic Imaging: No  results found.  Impression/Assessment:  Left ureteral stone, s/p pc tube placement  Plan:  Cysto, left rgp, left URS/HLL/extraction

## 2020-05-23 ENCOUNTER — Encounter (HOSPITAL_COMMUNITY): Payer: Self-pay | Admitting: Urology

## 2020-06-13 ENCOUNTER — Telehealth: Payer: Self-pay

## 2020-06-13 NOTE — Telephone Encounter (Signed)
Pts wife called. Pt c/o burning and stinging when urinating.  Wife stated he has a stent in and she knows it comes out on Tuesday. She wanted to know if this is normal. I explained it is with a stent. Wife will call back if needed.

## 2020-06-19 ENCOUNTER — Encounter: Payer: Self-pay | Admitting: Urology

## 2020-06-19 ENCOUNTER — Other Ambulatory Visit: Payer: Self-pay

## 2020-06-19 ENCOUNTER — Ambulatory Visit (INDEPENDENT_AMBULATORY_CARE_PROVIDER_SITE_OTHER): Payer: Medicare Other | Admitting: Urology

## 2020-06-19 VITALS — BP 102/57 | HR 65 | Temp 98.1°F | Ht 64.0 in | Wt 108.0 lb

## 2020-06-19 DIAGNOSIS — N201 Calculus of ureter: Secondary | ICD-10-CM | POA: Diagnosis not present

## 2020-06-19 DIAGNOSIS — N390 Urinary tract infection, site not specified: Secondary | ICD-10-CM

## 2020-06-19 MED ORDER — SULFAMETHOXAZOLE-TRIMETHOPRIM 800-160 MG PO TABS: 1.0000 | ORAL_TABLET | Freq: Two times a day (BID) | ORAL | 0 refills | Status: DC

## 2020-06-19 NOTE — Progress Notes (Addendum)
Cath Change/ Replacement  Patient is present today for a catheter change due to urinary retention.  44ml of water was removed from the balloon, a 24FR foley cath was removed with out difficulty.  Patient was cleaned and prepped in a sterile fashion with betadine. A 24 FR foley cath was replaced into the bladder no complications were noted Urine return was noted 70ml and urine was yellow in color. The balloon was filled with 11ml of sterile water. A leg bag was attached for drainage. Patient was given instruction on how to change from one bag to another. Patient was given proper instruction on catheter care.

## 2020-06-19 NOTE — Progress Notes (Signed)
Urological Symptom Review  Patient is experiencing the following symptoms: None   Review of Systems  Gastrointestinal (upper)  : Negative for upper GI symptoms  Gastrointestinal (lower) : Negative for lower GI symptoms  Constitutional : Negative for symptoms  Skin: Negative for skin symptoms  Eyes: Negative for eye symptoms  Ear/Nose/Throat : Negative for Ear/Nose/Throat symptoms  Hematologic/Lymphatic: Negative for Hematologic/Lymphatic symptoms  Cardiovascular : Negative for cardiovascular symptoms  Respiratory : Negative for respiratory symptoms  Endocrine: Negative for endocrine symptoms  Musculoskeletal: Negative for musculoskeletal symptoms  Neurological: Negative for neurological symptoms  Psychologic: Negative for psychiatric symptoms 

## 2020-06-19 NOTE — Progress Notes (Signed)
H&P  Chief Complaint: Follow-up of ureteroscopy  History of Present Illness:  Jordan Ward underwent left ureteroscopy with stone extraction and stent placement on August 24.  He did well initially but over the past week or so has had left flank pain and had a low-grade fever last night.  He presents for possible cystoscopy and stent extraction.   Pt here for f/u with his bladder stone treatment plan. Pt has recently lost a significant amount of weight following dietary limitation on sodium. He has also had a marked reduction in ambulation. Pt will finish his pre-op antibiotics on Friday (8.20.2021).  10.4.2019: Jordan Ward returns today for cystoscopy and SP tube change. He had a stone removed at his last tube change from the tract and he is to have cystoscopy to look for more stones. He was treated for a UTI after his last tube change.   Past Medical History:  Diagnosis Date  . Anemia   . Arthritis   . Back pain, chronic   . Bilateral carotid bruits   . C. difficile colitis 09/2011  . CAD (coronary artery disease)   . Carotid artery stenosis   . Cerebrovascular disease   . Cerebrovascular disease 08/17/2018  . Colon polyps   . Complication of cystostomy catheter, initial encounter ([place]) 04/20/2020  . Dyslipidemia   . Dysphagia 10/07/2011  . Encephalopathy   . Gait disorder   . HA (headache)   . High grade dysplasia in colonic adenoma 09/2005  . History of kidney stones   . HTN (hypertension), malignant 10/06/2011  . Hypernatremia   . Hypokalemia   . Hypothyroidism 10/08/2011  . Insomnia   . Junctional rhythm   . Kidney stones   . MS (multiple sclerosis) ([place])   . Neuromuscular disorder (H[facility])    MS  . OSA (obstructive sleep apnea)   . Paroxysmal atrial tachycardia ([place])   . Peripheral vascular disease ([place])   . Pneumonia 4 yrs ago  . Pulmonary fibrosis ([place]) 10/06/2011  . Pulmonary nodule 10/08/2011  . PVD (peripheral vascular disease) ([place])   . Sacral ulcer ([place])   . Sleep apnea     cannot tolerate  . Stroke [facility])    left sided weakness  . Suprapubic catheter ([place])   . TIA (transient ischemic attack)   . Tremors of nervous system 10/08/2011  . Urinary tract infection   . Ventral hernia without obstruction or gangrene     Large 8X9cm ventral hernia with loss of domain. CT reads report as diastasis recti with herniation or diastasis recti.  Dr. Constance Haw, Surgery, reviewed CT with radiology and there is herniation with only hernia sac or peritoneum over the bowel and large separation of the rectus muslce (i.e. diastasis recti aka loss of domain).  No surgical intervention recommended given size, age, and health.     Past Surgical History:  Procedure Laterality Date  . APPENDECTOMY  09/2005   at time of left hemicolectomy  . BACK SURGERY  1976/1979   lower  . BILIARY DILATION N/A 03/03/2020   Procedure: BILIARY DILATION;  Surgeon: Rogene Houston, MD;  [facility];  Service: Endoscopy;  Laterality: N/A;  . CATARACT EXTRACTION W/PHACO Right 03/08/2018   Procedure: CATARACT EXTRACTION PHACO AND INTRAOCULAR LENS PLACEMENT RIGHT EYE;  Surgeon: Tonny Branch, MD;  Location: [facility];  Service: Ophthalmology;  Laterality: Right;  CDE: 8.86  . CATARACT EXTRACTION W/PHACO Left 04/05/2018   Procedure: CATARACT EXTRACTION PHACO AND INTRAOCULAR LENS PLACEMENT (IOC);  Surgeon: Tonny Branch,  MD;  Location: [facility] OR[facility];  Service: Ophthalmology;  Laterality: Left;  CDE: 7.36  . CHOLECYSTECTOMY     Dr. Tamala Julian  . COLON SURGERY  09/2005   [place]: four tubular adenomas, large adenomatous polyp with HIGH GRADE dysplasia  . COLONOSCOPY  11/2004   Dr. Sharol Roussel sessile polyp splenic flexure, 57mm sessile polyp desc colon, tubulovillous adenoma (bx not removed)  . COLONOSCOPY  01/2005   poor prep, polyp could not be found  . COLONOSCOPY  05/2005   with EMR, polypectomy Dr. Olegario Messier, bx showed high grade dysplasia, partially resected  . COLONOSCOPY  09/2005   Dr. Arsenio Loader, [place] ink  tattooing, four villous colon polyp (3 had been missed on previous colonoscopies due to limitations of procedures  . COLONOSCOPY  09/2006   normal TI, no polyps  . COLONOSCOPY  10/2007   Dr. Imogene Burn distal mammillations, benign bx, normal TI, random bx neg for microscopic colitis  . CYSTOSCOPY WITH LITHOLAPAXY N/A 07/27/2018   Procedure: CYSTOSCOPY WITH LITHOLAPAXY VIA  SUPRAPUBIC TUBE;  Surgeon: Franchot Gallo, MD;  Location: [facility];  Service: Urology;  Laterality: N/A;  . CYSTOSCOPY WITH RETROGRADE PYELOGRAM, URETEROSCOPY AND STENT PLACEMENT Left 06/09/2017   Procedure: CYSTOSCOPY WITH LEFT RETROGRADE PYELOGRAM, LEFT URETEROSCOPY, LEFT URETEROSCOPIC STONE EXTRACTION, LEFT URETERAL STENT PLACEMENT;  Surgeon: Franchot Gallo, MD;  Location: [facility];  Service: Urology;  Laterality: Left;  . CYSTOSCOPY/URETEROSCOPY/HOLMIUM LASER/STENT PLACEMENT Left 05/22/2020   Procedure: CYSTOSCOPY/URETEROSCOPY/STENT PLACEMENT;  Surgeon: Franchot Gallo, MD;  Location: [facility];  Service: Urology;  Laterality: Left;  . ERCP N/A 03/03/2020   Procedure: ENDOSCOPIC RETROGRADE CHOLANGIOPANCREATOGRAPHY (ERCP);  Surgeon: Rogene Houston, MD;  [facility];  Service: Endoscopy;  Laterality: N/A;  . ERCP N/A 04/20/2020   Procedure: ENDOSCOPIC RETROGRADE CHOLANGIOPANCREATOGRAPHY (ERCP);  Surgeon: Rogene Houston, MD;  [facility];  Service: Endoscopy;  Laterality: N/A;  to be done at 7:30am in OR  . GASTROINTESTINAL STENT REMOVAL N/A 04/20/2020   Procedure: STENT REMOVAL;  Surgeon: Rogene Houston, MD;  [facility];  Service: Endoscopy;  Laterality: N/A;  . INGUINAL HERNIA REPAIR  1971   bilateral  . INSERTION OF SUPRAPUBIC CATHETER  06/09/2017   Procedure: EXCHANGE OF SUPRAPUBIC CATHETER;  Surgeon: Franchot Gallo, MD;  Location: [facility];  Service: Urology;;  . INSERTION OF SUPRAPUBIC CATHETER  05/22/2020   Procedure: SUPRAPUBIC CATHETER EXCHANGE;  Surgeon: Franchot Gallo, MD;  Location: [facility];  Service: Urology;;  . IR NEPHROSTOMY PLACEMENT LEFT  05/26/2017  . IR NEPHROSTOMY PLACEMENT LEFT  05/02/2020  . KIDNEY STONE SURGERY  09/13/2015  . LITHOTRIPSY N/A 03/03/2020   Procedure: MECHANICAL LITHOTRIPSY WITH REMOVAL OF MULTIPLE STONE FRAGMENTS;  Surgeon: Rogene Houston, MD;  [facility];  Service: Endoscopy;  Laterality: N/A;  . NEPHROLITHOTOMY Left 09/13/2015   Procedure: LEFT PERCUTANEOUS NEPHROLITHOTOMY ;  Surgeon: Franchot Gallo, MD;  Location: [facility];  Service: Urology;  Laterality: Left;  . NEPHROSTOMY TUBE REMOVAL  05/22/2020   Procedure: NEPHROSTOMY TUBE REMOVAL;  Surgeon: Franchot Gallo, MD;  Location: [facility];  Service: Urology;;  . REMOVAL OF STONES N/A 04/20/2020   Procedure: REMOVAL OF STONES;  Surgeon: Rogene Houston, MD;  [facility];  Service: Endoscopy;  Laterality: N/A;  . SPHINCTEROTOMY  03/03/2020   Procedure: BILLARY SPHINCTEROTOMY;  Surgeon: Rogene Houston, MD;  [facility];  Service: Endoscopy;;  . SUPRAPUBIC CATHETER INSERTION      Home Medications:  Allergies as  of 06/19/2020      Reactions   Tetracyclines & Related Anaphylaxis, Rash   Ciprofloxacin    Trouble swallowing unknown reaction according to wife       Medication List       Accurate as of June 19, 2020 12:24 PM. If you have any questions, ask your nurse or doctor.        amLODipine 10 MG tablet Commonly known as: NORVASC Take 1 tablet (10 mg total) by mouth daily. What changed:   how much to take  when to take this   carvedilol 6.25 MG tablet Commonly known as: COREG Take 6.25 mg by mouth 2 (two) times daily with a meal.   diazepam 5 MG tablet Commonly known as: VALIUM TAKE (1) TABLET BY MOUTH TWICE DAILY.   HYDROcodone-acetaminophen 7.5-325 MG tablet Commonly known as: NORCO Take 1 tablet by mouth 2 (two) times daily. Max APAP 3 GM IN 24 HOURS FROM ALL SOURCES What changed:   when to take  this  additional instructions   levothyroxine 75 MCG tablet Commonly known as: SYNTHROID Take 75 mcg by mouth daily before breakfast. ALL MEDICATIONS TO BE CRUSHED AND PLACED IN APPLESAUCE   mirabegron ER 25 MG Tb24 tablet Commonly known as: Myrbetriq Take 1 tablet (25 mg total) by mouth daily as needed (bladder spasms).   potassium chloride SA 20 MEQ tablet Commonly known as: KLOR-CON Take 20 mEq by mouth daily.       Allergies:  Allergies  Allergen Reactions  . Tetracyclines & Related Anaphylaxis and Rash  . Ciprofloxacin     Trouble swallowing unknown reaction according to wife     Family History  Problem Relation Age of Onset  . Cirrhosis Brother        etoh  . Stroke Mother 28  . Coronary artery disease Father 56  . Heart attack Brother   . Cancer Sister   . Multiple sclerosis Other   . Colon cancer Neg Hx     Social History:  reports that he quit smoking about 32 years ago. His smoking use included cigarettes. He has a 25.00 pack-year smoking history. He has never used smokeless tobacco. He reports that he does not drink alcohol and does not use drugs.  ROS: A complete review of systems was performed.  All systems are negative except for pertinent findings as noted.  Physical Exam:  Vital signs in last 24 hours: There were no vitals taken for this visit. Constitutional:  Alert and oriented, No acute distress Cardiovascular: Regular rate  Respiratory: Normal respiratory effort GI: Abdomen is soft, nontender, nondistended, no abdominal masses.  Mild left CVA tenderness. Lymphatic: No lymphadenopathy Neurologic: Grossly intact, no focal deficits Psychiatric: Normal mood and affect  I have reviewed prior pt notes  I have reviewed notes from referring/previous physicians  I have reviewed urinalysis results  I have independently reviewed prior imaging   Impression/Assessment:  1.  Neurogenic bladder with indwelling suprapubic tube.  He is due for  change today  2.  Left ureteral/renal calculi, status post ureteroscopic management on August 24.  Question whether he has a urinary infection  Plan:   1. Pt started on 1 week regimen of bactrim.  2. F/U in 1 week for cysto and stent removal  CC: Dr. Allyn Kenner

## 2020-06-25 ENCOUNTER — Telehealth: Payer: Self-pay | Admitting: Urology

## 2020-06-25 ENCOUNTER — Telehealth: Payer: Self-pay

## 2020-06-25 ENCOUNTER — Other Ambulatory Visit: Payer: Medicare Other

## 2020-06-25 NOTE — Telephone Encounter (Signed)
Pt's wife called stating she is positive for covid and pt is showing no symptoms. Would like to know if she should get him tested because she would have to drive him and they would be in even closer proximity

## 2020-06-25 NOTE — Telephone Encounter (Signed)
Needs to test neg before he comes in--at earliest next week

## 2020-06-25 NOTE — Telephone Encounter (Signed)
Pt's wife called and stated that she has covid. He is supposed to have an appointment tomorrow to remove his stent from surgery but I wasn't sure what you wanted to do since she has covid and hasn't been quarantined from him. Thanks.

## 2020-06-26 ENCOUNTER — Ambulatory Visit: Payer: Medicare Other | Admitting: Urology

## 2020-06-26 NOTE — Telephone Encounter (Signed)
Attempted to call patient.  Unable to reach at this time.  Voicemail has not been set up; unable to leave message.

## 2020-06-29 ENCOUNTER — Other Ambulatory Visit: Payer: Medicare Other

## 2020-06-29 DIAGNOSIS — Z20822 Contact with and (suspected) exposure to covid-19: Secondary | ICD-10-CM

## 2020-07-01 ENCOUNTER — Other Ambulatory Visit: Payer: Self-pay | Admitting: Nurse Practitioner

## 2020-07-01 ENCOUNTER — Telehealth: Payer: Self-pay

## 2020-07-01 DIAGNOSIS — J8410 Pulmonary fibrosis, unspecified: Secondary | ICD-10-CM

## 2020-07-01 DIAGNOSIS — U071 COVID-19: Secondary | ICD-10-CM

## 2020-07-01 DIAGNOSIS — R6510 Systemic inflammatory response syndrome (SIRS) of non-infectious origin without acute organ dysfunction: Secondary | ICD-10-CM

## 2020-07-01 LAB — SARS-COV-2, NAA 2 DAY TAT

## 2020-07-01 LAB — NOVEL CORONAVIRUS, NAA: SARS-CoV-2, NAA: DETECTED — AB

## 2020-07-01 NOTE — Telephone Encounter (Signed)
Called pt's wife back. Wife would like the infusion nurse to call them. Called and LM on infusion line VM.

## 2020-07-01 NOTE — Progress Notes (Signed)
I connected by phone with Jordan Ward on 07/01/2020 at 12:05 PM to discuss the potential use of a new treatment for mild to moderate COVID-19 viral infection in non-hospitalized patients.  This patient is a 79 y.o. male that meets the FDA criteria for Emergency Use Authorization of COVID monoclonal antibody casirivimab/imdevimab or bamlanivimab/eteseviamb.  Has a (+) direct SARS-CoV-2 viral test result  Has mild or moderate COVID-19   Is NOT hospitalized due to COVID-19  Is within 10 days of symptom onset  Has at least one of the high risk factor(s) for progression to severe COVID-19 and/or hospitalization as defined in EUA.  Specific high risk criteria : Older age (>/= 77 yo), Immunosuppressive Disease or Treatment, Cardiovascular disease or hypertension and Neurodevelopmental disorder   I have spoken and communicated the following to the patient or parent/caregiver regarding COVID monoclonal antibody treatment:  1. FDA has authorized the emergency use for the treatment of mild to moderate COVID-19 in adults and pediatric patients with positive results of direct SARS-CoV-2 viral testing who are 57 years of age and older weighing at least 40 kg, and who are at high risk for progressing to severe COVID-19 and/or hospitalization.  2. The significant known and potential risks and benefits of COVID monoclonal antibody, and the extent to which such potential risks and benefits are unknown.  3. Information on available alternative treatments and the risks and benefits of those alternatives, including clinical trials.  4. Patients treated with COVID monoclonal antibody should continue to self-isolate and use infection control measures (e.g., wear mask, isolate, social distance, avoid sharing personal items, clean and disinfect "high touch" surfaces, and frequent handwashing) according to CDC guidelines.   5. The patient or parent/caregiver has the option to accept or refuse COVID monoclonal  antibody treatment.  After reviewing this information with the patient, the patient has agreed to receive one of the available covid 19 monoclonal antibodies and will be provided an appropriate fact sheet prior to infusion. Jobe Gibbon, NP 07/01/2020 12:05 PM

## 2020-07-03 ENCOUNTER — Emergency Department (HOSPITAL_COMMUNITY): Payer: Medicare Other

## 2020-07-03 ENCOUNTER — Other Ambulatory Visit: Payer: Self-pay | Admitting: Nurse Practitioner

## 2020-07-03 ENCOUNTER — Encounter (HOSPITAL_COMMUNITY): Payer: Self-pay | Admitting: Internal Medicine

## 2020-07-03 ENCOUNTER — Other Ambulatory Visit: Payer: Medicare Other | Admitting: Urology

## 2020-07-03 ENCOUNTER — Other Ambulatory Visit: Payer: Self-pay

## 2020-07-03 ENCOUNTER — Inpatient Hospital Stay (HOSPITAL_COMMUNITY)
Admission: EM | Admit: 2020-07-03 | Discharge: 2020-07-13 | DRG: 177 | Disposition: A | Discharge status: Skilled Nursing Facility | Payer: Medicare Other | Attending: Internal Medicine | Admitting: Internal Medicine

## 2020-07-03 ENCOUNTER — Ambulatory Visit (HOSPITAL_COMMUNITY)
Admission: RE | Admit: 2020-07-03 | Discharge: 2020-07-03 | Disposition: A | Discharge status: Home / Self Care | Payer: Medicare Other | Source: Ambulatory Visit | Attending: Pulmonary Disease | Admitting: Pulmonary Disease

## 2020-07-03 DIAGNOSIS — R0602 Shortness of breath: Secondary | ICD-10-CM

## 2020-07-03 DIAGNOSIS — I130 Hypertensive heart and chronic kidney disease with heart failure and stage 1 through stage 4 chronic kidney disease, or unspecified chronic kidney disease: Secondary | ICD-10-CM | POA: Diagnosis present

## 2020-07-03 DIAGNOSIS — R6510 Systemic inflammatory response syndrome (SIRS) of non-infectious origin without acute organ dysfunction: Secondary | ICD-10-CM

## 2020-07-03 DIAGNOSIS — R2681 Unsteadiness on feet: Secondary | ICD-10-CM | POA: Diagnosis not present

## 2020-07-03 DIAGNOSIS — R918 Other nonspecific abnormal finding of lung field: Secondary | ICD-10-CM | POA: Diagnosis not present

## 2020-07-03 DIAGNOSIS — Z743 Need for continuous supervision: Secondary | ICD-10-CM | POA: Diagnosis not present

## 2020-07-03 DIAGNOSIS — I5032 Chronic diastolic (congestive) heart failure: Secondary | ICD-10-CM | POA: Diagnosis present

## 2020-07-03 DIAGNOSIS — N261 Atrophy of kidney (terminal): Secondary | ICD-10-CM | POA: Diagnosis present

## 2020-07-03 DIAGNOSIS — G35 Multiple sclerosis: Secondary | ICD-10-CM | POA: Diagnosis present

## 2020-07-03 DIAGNOSIS — R7989 Other specified abnormal findings of blood chemistry: Secondary | ICD-10-CM | POA: Diagnosis not present

## 2020-07-03 DIAGNOSIS — R1312 Dysphagia, oropharyngeal phase: Secondary | ICD-10-CM | POA: Diagnosis not present

## 2020-07-03 DIAGNOSIS — N1330 Unspecified hydronephrosis: Secondary | ICD-10-CM | POA: Diagnosis present

## 2020-07-03 DIAGNOSIS — N200 Calculus of kidney: Secondary | ICD-10-CM

## 2020-07-03 DIAGNOSIS — N179 Acute kidney failure, unspecified: Secondary | ICD-10-CM | POA: Diagnosis present

## 2020-07-03 DIAGNOSIS — N1832 Chronic kidney disease, stage 3b: Secondary | ICD-10-CM | POA: Diagnosis present

## 2020-07-03 DIAGNOSIS — Z961 Presence of intraocular lens: Secondary | ICD-10-CM | POA: Diagnosis present

## 2020-07-03 DIAGNOSIS — Z8249 Family history of ischemic heart disease and other diseases of the circulatory system: Secondary | ICD-10-CM

## 2020-07-03 DIAGNOSIS — M6259 Muscle wasting and atrophy, not elsewhere classified, multiple sites: Secondary | ICD-10-CM | POA: Diagnosis not present

## 2020-07-03 DIAGNOSIS — I69354 Hemiplegia and hemiparesis following cerebral infarction affecting left non-dominant side: Secondary | ICD-10-CM | POA: Diagnosis not present

## 2020-07-03 DIAGNOSIS — Z9841 Cataract extraction status, right eye: Secondary | ICD-10-CM

## 2020-07-03 DIAGNOSIS — I739 Peripheral vascular disease, unspecified: Secondary | ICD-10-CM | POA: Diagnosis present

## 2020-07-03 DIAGNOSIS — J479 Bronchiectasis, uncomplicated: Secondary | ICD-10-CM | POA: Diagnosis not present

## 2020-07-03 DIAGNOSIS — G4733 Obstructive sleep apnea (adult) (pediatric): Secondary | ICD-10-CM | POA: Diagnosis present

## 2020-07-03 DIAGNOSIS — J1282 Pneumonia due to coronavirus disease 2019: Secondary | ICD-10-CM | POA: Diagnosis present

## 2020-07-03 DIAGNOSIS — R54 Age-related physical debility: Secondary | ICD-10-CM | POA: Insufficient documentation

## 2020-07-03 DIAGNOSIS — I10 Essential (primary) hypertension: Secondary | ICD-10-CM | POA: Diagnosis present

## 2020-07-03 DIAGNOSIS — Z823 Family history of stroke: Secondary | ICD-10-CM

## 2020-07-03 DIAGNOSIS — Z87891 Personal history of nicotine dependence: Secondary | ICD-10-CM | POA: Diagnosis not present

## 2020-07-03 DIAGNOSIS — R739 Hyperglycemia, unspecified: Secondary | ICD-10-CM | POA: Diagnosis present

## 2020-07-03 DIAGNOSIS — T380X5A Adverse effect of glucocorticoids and synthetic analogues, initial encounter: Secondary | ICD-10-CM | POA: Diagnosis present

## 2020-07-03 DIAGNOSIS — Z888 Allergy status to other drugs, medicaments and biological substances status: Secondary | ICD-10-CM | POA: Diagnosis not present

## 2020-07-03 DIAGNOSIS — R498 Other voice and resonance disorders: Secondary | ICD-10-CM | POA: Diagnosis not present

## 2020-07-03 DIAGNOSIS — Z79899 Other long term (current) drug therapy: Secondary | ICD-10-CM | POA: Diagnosis not present

## 2020-07-03 DIAGNOSIS — R279 Unspecified lack of coordination: Secondary | ICD-10-CM | POA: Diagnosis not present

## 2020-07-03 DIAGNOSIS — Z820 Family history of epilepsy and other diseases of the nervous system: Secondary | ICD-10-CM

## 2020-07-03 DIAGNOSIS — E785 Hyperlipidemia, unspecified: Secondary | ICD-10-CM | POA: Diagnosis present

## 2020-07-03 DIAGNOSIS — N189 Chronic kidney disease, unspecified: Secondary | ICD-10-CM

## 2020-07-03 DIAGNOSIS — G9349 Other encephalopathy: Secondary | ICD-10-CM | POA: Diagnosis present

## 2020-07-03 DIAGNOSIS — Z515 Encounter for palliative care: Secondary | ICD-10-CM | POA: Diagnosis not present

## 2020-07-03 DIAGNOSIS — G35D Multiple sclerosis, unspecified: Secondary | ICD-10-CM | POA: Diagnosis present

## 2020-07-03 DIAGNOSIS — R2689 Other abnormalities of gait and mobility: Secondary | ICD-10-CM | POA: Diagnosis not present

## 2020-07-03 DIAGNOSIS — Z79890 Hormone replacement therapy (postmenopausal): Secondary | ICD-10-CM

## 2020-07-03 DIAGNOSIS — E038 Other specified hypothyroidism: Secondary | ICD-10-CM | POA: Diagnosis not present

## 2020-07-03 DIAGNOSIS — J8410 Pulmonary fibrosis, unspecified: Secondary | ICD-10-CM

## 2020-07-03 DIAGNOSIS — J9601 Acute respiratory failure with hypoxia: Secondary | ICD-10-CM | POA: Diagnosis not present

## 2020-07-03 DIAGNOSIS — Z9842 Cataract extraction status, left eye: Secondary | ICD-10-CM | POA: Diagnosis not present

## 2020-07-03 DIAGNOSIS — U071 COVID-19: Secondary | ICD-10-CM

## 2020-07-03 DIAGNOSIS — R0902 Hypoxemia: Secondary | ICD-10-CM | POA: Diagnosis not present

## 2020-07-03 DIAGNOSIS — I679 Cerebrovascular disease, unspecified: Secondary | ICD-10-CM | POA: Diagnosis not present

## 2020-07-03 DIAGNOSIS — R7303 Prediabetes: Secondary | ICD-10-CM | POA: Diagnosis present

## 2020-07-03 DIAGNOSIS — I959 Hypotension, unspecified: Secondary | ICD-10-CM | POA: Diagnosis not present

## 2020-07-03 DIAGNOSIS — M6281 Muscle weakness (generalized): Secondary | ICD-10-CM | POA: Diagnosis not present

## 2020-07-03 DIAGNOSIS — Z8719 Personal history of other diseases of the digestive system: Secondary | ICD-10-CM

## 2020-07-03 DIAGNOSIS — Y92239 Unspecified place in hospital as the place of occurrence of the external cause: Secondary | ICD-10-CM | POA: Diagnosis present

## 2020-07-03 DIAGNOSIS — Z7189 Other specified counseling: Secondary | ICD-10-CM | POA: Diagnosis not present

## 2020-07-03 DIAGNOSIS — I2510 Atherosclerotic heart disease of native coronary artery without angina pectoris: Secondary | ICD-10-CM | POA: Diagnosis present

## 2020-07-03 DIAGNOSIS — N319 Neuromuscular dysfunction of bladder, unspecified: Secondary | ICD-10-CM | POA: Diagnosis present

## 2020-07-03 DIAGNOSIS — R5381 Other malaise: Secondary | ICD-10-CM | POA: Diagnosis not present

## 2020-07-03 DIAGNOSIS — R41841 Cognitive communication deficit: Secondary | ICD-10-CM | POA: Diagnosis not present

## 2020-07-03 DIAGNOSIS — E039 Hypothyroidism, unspecified: Secondary | ICD-10-CM | POA: Diagnosis present

## 2020-07-03 DIAGNOSIS — Z87442 Personal history of urinary calculi: Secondary | ICD-10-CM

## 2020-07-03 LAB — FIBRINOGEN: Fibrinogen: 431 mg/dL (ref 210–475)

## 2020-07-03 LAB — COMPREHENSIVE METABOLIC PANEL
ALT: 23 U/L (ref 0–44)
AST: 69 U/L — ABNORMAL HIGH (ref 15–41)
Albumin: 3.2 g/dL — ABNORMAL LOW (ref 3.5–5.0)
Alkaline Phosphatase: 55 U/L (ref 38–126)
Anion gap: 12 (ref 5–15)
BUN: 55 mg/dL — ABNORMAL HIGH (ref 8–23)
CO2: 25 mmol/L (ref 22–32)
Calcium: 8.3 mg/dL — ABNORMAL LOW (ref 8.9–10.3)
Chloride: 110 mmol/L (ref 98–111)
Creatinine, Ser: 2.94 mg/dL — ABNORMAL HIGH (ref 0.61–1.24)
GFR calc non Af Amer: 20 mL/min — ABNORMAL LOW (ref >60)
Glucose, Bld: 111 mg/dL — ABNORMAL HIGH (ref 70–99)
Potassium: 3.7 mmol/L (ref 3.5–5.1)
Sodium: 147 mmol/L — ABNORMAL HIGH (ref 135–145)
Total Bilirubin: 1.2 mg/dL (ref 0.3–1.2)
Total Protein: 7.3 g/dL (ref 6.5–8.1)

## 2020-07-03 LAB — LACTIC ACID, PLASMA
Lactic Acid, Venous: 1.3 mmol/L (ref 0.5–1.9)
Lactic Acid, Venous: 1.1 mmol/L (ref 0.5–1.9)

## 2020-07-03 LAB — CBC
HCT: 37.4 % — ABNORMAL LOW (ref 39.0–52.0)
Hemoglobin: 12.1 g/dL — ABNORMAL LOW (ref 13.0–17.0)
MCH: 30.9 pg (ref 26.0–34.0)
MCHC: 32.4 g/dL (ref 30.0–36.0)
MCV: 95.7 fL (ref 80.0–100.0)
Platelets: 100 10*3/uL — ABNORMAL LOW (ref 150–400)
RBC: 3.91 MIL/uL — ABNORMAL LOW (ref 4.22–5.81)
RDW: 14.1 % (ref 11.5–15.5)
WBC: 4.7 10*3/uL (ref 4.0–10.5)
nRBC: 0 % (ref 0.0–0.2)

## 2020-07-03 LAB — C-REACTIVE PROTEIN: CRP: 12.4 mg/dL — ABNORMAL HIGH (ref <1.0)

## 2020-07-03 LAB — TRIGLYCERIDES: Triglycerides: 108 mg/dL (ref <150)

## 2020-07-03 LAB — LACTATE DEHYDROGENASE: LDH: 411 U/L — ABNORMAL HIGH (ref 98–192)

## 2020-07-03 LAB — D-DIMER, QUANTITATIVE: D-Dimer, Quant: 2.42 ug/mL-FEU — ABNORMAL HIGH (ref 0.00–0.50)

## 2020-07-03 LAB — PROCALCITONIN: Procalcitonin: <0.1 ng/mL

## 2020-07-03 LAB — FERRITIN: Ferritin: 804 ng/mL — ABNORMAL HIGH (ref 24–336)

## 2020-07-03 MED ORDER — IPRATROPIUM-ALBUTEROL 20-100 MCG/ACT IN AERS
1.0000 | INHALATION_SPRAY | Freq: Four times a day (QID) | RESPIRATORY_TRACT | Status: DC
  Administered 2020-07-03 – 2020-07-06 (×10): 1 via RESPIRATORY_TRACT
  Filled 2020-07-03: qty 4

## 2020-07-03 MED ORDER — DEXAMETHASONE 4 MG PO TABS: 6.0000 mg | ORAL_TABLET | Freq: Once | ORAL | Status: DC

## 2020-07-03 MED ORDER — HYDROCODONE-ACETAMINOPHEN 7.5-325 MG PO TABS
1.0000 | ORAL_TABLET | Freq: Two times a day (BID) | ORAL | Status: DC
  Administered 2020-07-03 – 2020-07-13 (×20): 1 via ORAL
  Filled 2020-07-03 (×20): qty 1

## 2020-07-03 MED ORDER — PREDNISONE 20 MG PO TABS
50.0000 mg | ORAL_TABLET | Freq: Every day | ORAL | Status: DC
  Administered 2020-07-07 – 2020-07-13 (×7): 50 mg via ORAL
  Filled 2020-07-03 (×7): qty 2

## 2020-07-03 MED ORDER — DEXAMETHASONE SODIUM PHOSPHATE 10 MG/ML IJ SOLN
6.0000 mg | Freq: Once | INTRAMUSCULAR | Status: AC
  Administered 2020-07-03: 6 mg via INTRAVENOUS
  Filled 2020-07-03: qty 1

## 2020-07-03 MED ORDER — SODIUM CHLORIDE 0.9% FLUSH
3.0000 mL | Freq: Two times a day (BID) | INTRAVENOUS | Status: DC
  Administered 2020-07-03 – 2020-07-13 (×19): 3 mL via INTRAVENOUS

## 2020-07-03 MED ORDER — DIPHENHYDRAMINE HCL 50 MG/ML IJ SOLN: 50.0000 mg | Freq: Once | INTRAMUSCULAR | Status: DC | PRN

## 2020-07-03 MED ORDER — ACETAMINOPHEN 325 MG PO TABS
650.0000 mg | ORAL_TABLET | Freq: Four times a day (QID) | ORAL | Status: DC | PRN
  Administered 2020-07-07 – 2020-07-10 (×2): 650 mg via ORAL
  Filled 2020-07-03 (×2): qty 2

## 2020-07-03 MED ORDER — METHYLPREDNISOLONE SODIUM SUCC 125 MG IJ SOLR: 125.0000 mg | Freq: Once | INTRAMUSCULAR | Status: DC | PRN

## 2020-07-03 MED ORDER — LACTATED RINGERS IV BOLUS
500.0000 mL | Freq: Once | INTRAVENOUS | Status: AC
  Administered 2020-07-03: 500 mL via INTRAVENOUS

## 2020-07-03 MED ORDER — SODIUM CHLORIDE 0.9 % IV SOLN: 100.0000 mg | Freq: Every day | INTRAVENOUS | Status: DC

## 2020-07-03 MED ORDER — HEPARIN SODIUM (PORCINE) 5000 UNIT/ML IJ SOLN
5000.0000 [IU] | Freq: Three times a day (TID) | INTRAMUSCULAR | Status: DC
  Administered 2020-07-03 – 2020-07-13 (×30): 5000 [IU] via SUBCUTANEOUS
  Filled 2020-07-03 (×31): qty 1

## 2020-07-03 MED ORDER — SODIUM CHLORIDE 0.9 % IV SOLN
100.0000 mg | INTRAVENOUS | Status: AC
  Administered 2020-07-04 – 2020-07-07 (×4): 100 mg via INTRAVENOUS
  Filled 2020-07-03 (×4): qty 20

## 2020-07-03 MED ORDER — METHYLPREDNISOLONE SODIUM SUCC 40 MG IJ SOLR
0.5000 mg/kg | Freq: Two times a day (BID) | INTRAMUSCULAR | Status: AC
  Administered 2020-07-03 – 2020-07-06 (×6): 25.2 mg via INTRAVENOUS
  Filled 2020-07-03 (×6): qty 1

## 2020-07-03 MED ORDER — SODIUM CHLORIDE 0.9 % IV SOLN: 100.0000 mg | INTRAVENOUS | Status: DC

## 2020-07-03 MED ORDER — SODIUM CHLORIDE 0.9 % IV BOLUS
1000.0000 mL | Freq: Once | INTRAVENOUS | Status: AC
  Administered 2020-07-03: 1000 mL via INTRAVENOUS

## 2020-07-03 MED ORDER — SODIUM CHLORIDE 0.9 % IV SOLN: 1200.0000 mg | Freq: Once | INTRAVENOUS | Status: DC

## 2020-07-03 MED ORDER — SODIUM CHLORIDE 0.9 % IV SOLN: 200.0000 mg | Freq: Once | INTRAVENOUS | Status: DC

## 2020-07-03 MED ORDER — FAMOTIDINE IN NACL 20-0.9 MG/50ML-% IV SOLN: 20.0000 mg | Freq: Once | INTRAVENOUS | Status: DC | PRN

## 2020-07-03 MED ORDER — EPINEPHRINE 0.3 MG/0.3ML IJ SOAJ: 0.3000 mg | Freq: Once | INTRAMUSCULAR | Status: DC | PRN

## 2020-07-03 MED ORDER — SODIUM CHLORIDE 0.9 % IV SOLN
200.0000 mg | Freq: Once | INTRAVENOUS | Status: AC
  Administered 2020-07-03: 200 mg via INTRAVENOUS
  Filled 2020-07-03: qty 200

## 2020-07-03 MED ORDER — LEVOTHYROXINE SODIUM 75 MCG PO TABS
75.0000 ug | ORAL_TABLET | Freq: Every day | ORAL | Status: DC
  Administered 2020-07-04 – 2020-07-13 (×10): 75 ug via ORAL
  Filled 2020-07-03 (×11): qty 1

## 2020-07-03 MED ORDER — BARICITINIB 1 MG PO TABS
1.0000 mg | ORAL_TABLET | Freq: Every day | ORAL | Status: DC
  Administered 2020-07-04 (×2): 1 mg via ORAL
  Filled 2020-07-03 (×3): qty 1

## 2020-07-03 MED ORDER — ALBUTEROL SULFATE HFA 108 (90 BASE) MCG/ACT IN AERS: 2.0000 | INHALATION_SPRAY | Freq: Once | RESPIRATORY_TRACT | Status: DC | PRN

## 2020-07-03 MED ORDER — DIAZEPAM 5 MG PO TABS
5.0000 mg | ORAL_TABLET | Freq: Two times a day (BID) | ORAL | Status: DC | PRN
  Administered 2020-07-04 – 2020-07-12 (×9): 5 mg via ORAL
  Filled 2020-07-03 (×10): qty 1

## 2020-07-03 MED ORDER — GUAIFENESIN-DM 100-10 MG/5ML PO SYRP
10.0000 mL | ORAL_SOLUTION | ORAL | Status: DC | PRN
  Administered 2020-07-11 – 2020-07-12 (×2): 10 mL via ORAL
  Filled 2020-07-03 (×2): qty 10

## 2020-07-03 MED ORDER — SODIUM CHLORIDE 0.9 % IV SOLN: INTRAVENOUS | Status: DC | PRN

## 2020-07-03 NOTE — Progress Notes (Signed)
Patient arrived for MAB Infusion for treatment of COVID-19 viral infection at the [facility]. Upon initial assessment, it was discovered that the patient was desaturating on room air with oxygen levels in the upper 70's. He was placed on 4L [place] and oxygen saturation increased to 92-94%.   He denies shortness of breath, chest pain, chills, or difficulty breathing at this time.   He has no increased WOB. His lungs are diminished in the bases with fine crackles noted.  His vitals are otherwise stable. He is alert and oriented x3 and appears in no distress at this time.   [facility] Nurse notified the emergency room for need of transfer. Patient will remain in the infusion center until a transfer can be performed to the ED.   I have notified the patients wife, Askari Kinley, [phone], of the circumstances and the need for transfer to the ED. Discussed with the wife that we will not be able to provide the MAB Infusion due to the oxygen requirements, but he may be eligible for Remdesivir treatment through the hospital.   All questions answered. She expressed gratitude for the care provided through the staff in the [facility].   Spouse requests call from ED when a plan of care is determined.

## 2020-07-03 NOTE — ED Triage Notes (Addendum)
Pt arrived at [facility] for monoclonal antibody infusion. Upon arrival his O2 was in the 70s on room air. 4L oxygen was applied, and he was transported to the ED. Upon arrival at ED, pt was too weak to stand up from the wheelchair or transfer to the bed. His O2 is 93-95% on 6L. He walks at home with a walker. He thinks he tested positive for COVID yesterday. Unvaccinated. His significant other was treated at the [facility] mAb clinic last week and doing well.

## 2020-07-03 NOTE — ED Provider Notes (Signed)
[facility] DEPT Provider Note   CSN: [id number] Arrival date & time: 07/03/20  1110     History Chief Complaint  Patient presents with  . Weakness    Jordan Ward is a 79 y.o. male.  79 year old male with complex medical history including MS, suprapubic catheter presents today with acute hypoxic respiratory failure in setting of Covid 19 diagnosis.  Patient tested positive on October 1, he cannot remember what day his symptoms first started.  His wife had symptomatic Covid about a week prior to that.  Initially patient has had a mild cough, he presented today to the infusion center at [facility] for Mab treatment and was found to be desaturating to the 70s on room air.  He was transferred to the ED and is presently saddling on 7 L nasal cannula to maintain O2 sats greater than 90%.  He denies fever, chills, nausea, vomiting, diarrhea, rash.  He is A&O x4.  Patient is unvaccinated.  Patient walks with a walker at baseline, and is able to perform all his ADLs unassisted.        Past Medical History:  Diagnosis Date  . Anemia   . Arthritis   . Back pain, chronic   . Bilateral carotid bruits   . C. difficile colitis 09/2011  . CAD (coronary artery disease)   . Carotid artery stenosis   . Cerebrovascular disease   . Cerebrovascular disease 08/17/2018  . Colon polyps   . Complication of cystostomy catheter, initial encounter ([place]) 04/20/2020  . Dyslipidemia   . Dysphagia 10/07/2011  . Encephalopathy   . Gait disorder   . HA (headache)   . High grade dysplasia in colonic adenoma 09/2005  . History of kidney stones   . HTN (hypertension), malignant 10/06/2011  . Hypernatremia   . Hypokalemia   . Hypothyroidism 10/08/2011  . Insomnia   . Junctional rhythm   . Kidney stones   . MS (multiple sclerosis) ([place])   . Neuromuscular disorder (HCC)    MS  . OSA (obstructive sleep apnea)   . Paroxysmal atrial tachycardia ([place])   . Peripheral vascular disease  ([place])   . Pneumonia 4 yrs ago  . Pulmonary fibrosis ([place]) 10/06/2011  . Pulmonary nodule 10/08/2011  . PVD (peripheral vascular disease) ([place])   . Sacral ulcer ([place])   . Sleep apnea    cannot tolerate  . Stroke [facility])    left sided weakness  . Suprapubic catheter ([place])   . TIA (transient ischemic attack)   . Tremors of nervous system 10/08/2011  . Urinary tract infection   . Ventral hernia without obstruction or gangrene     Large 8X9cm ventral hernia with loss of domain. CT reads report as diastasis recti with herniation or diastasis recti.  Dr. Constance Haw, Surgery, reviewed CT with radiology and there is herniation with only hernia sac or peritoneum over the bowel and large separation of the rectus muslce (i.e. diastasis recti aka loss of domain).  No surgical intervention recommended given size, age, and health.     Patient Active Problem List   Diagnosis Date Noted  . Acute hypoxemic respiratory failure due to COVID-19 ([place]) 07/03/2020  . Calculus of ureter 05/15/2020  . Bacteremia due to Gram-negative bacteria 05/04/2020  . Acute lower UTI 05/04/2020  . Acute unilateral obstructive uropathy   . SIRS (systemic inflammatory response syndrome) ([place]) 05/03/2020  . Hydronephrosis 05/02/2020  . Complication of cystostomy catheter, subsequent encounter 04/20/2020  . Acute kidney  injury superimposed on CKD ([place]) 03/02/2020  . Dehydration 03/02/2020  . Nausea and vomiting 03/02/2020  . Ventral hernia without obstruction or gangrene 03/01/2020  . Hypotension 03/01/2020  . Poor appetite 03/01/2020  . Urinary tract infection 03/01/2020  . Detrusor areflexia 09/16/2019  . Cerebrovascular disease 08/17/2018  . Partial small bowel obstruction ([place]) 07/11/2017  . Choledocholithiasis 07/11/2017  . Complicated UTI (urinary tract infection)   . Sepsis secondary to UTI ([place]) 06/14/2017  . Paroxysmal atrial tachycardia ([place]) 06/14/2017  . Hypophosphatemia 06/14/2017  . Pressure injury of skin  05/26/2017  . Hydronephrosis due to obstruction of ureter 05/26/2017  . Obstructive uropathy 05/25/2017  . CKD (chronic kidney disease), stage III ([place]) 08/23/2016  . Hydronephrosis of left kidney 08/23/2016  . Kidney stones 09/13/2015  . Essential hypertension   . Pressure ulcer 08/04/2015  . Chronic diastolic (congestive) heart failure ([place]) 08/04/2015  . Hydronephrosis with obstructing calculus 08/04/2015  . Occult blood positive stool 09/05/2014  . Rash and nonspecific skin eruption 08/25/2014  . Edema 08/19/2014  . Subacute confusional state 08/06/2014  . Dilated cardiomyopathy ([place]) 07/21/2014  . Sepsis ([place]) 07/17/2014  . Severe sepsis ([place]) 07/17/2014  . Headache 03/13/2014  . CVA (cerebral infarction) 03/13/2014  . Abnormality of gait 03/13/2014  . Tremors of nervous system 10/08/2011  . Hypothyroidism 10/08/2011  . Pulmonary nodule 10/08/2011  . Dysphagia 10/07/2011  . HTN (hypertension), malignant 10/06/2011  . Chronic suprapubic catheter ([place]) 10/06/2011  . Pulmonary fibrosis ([place]) 10/06/2011  . UTI (urinary tract infection) 10/03/2011  . PNA (pneumonia) 10/02/2011  . Junctional rhythm 10/02/2011  . Multiple sclerosis ([place]) 10/02/2011  . Chronic diarrhea 06/17/2011  . Hx of adenomatous colonic polyps 06/17/2011  . High grade dysplasia in colonic adenoma 09/29/2005    Past Surgical History:  Procedure Laterality Date  . APPENDECTOMY  09/2005   at time of left hemicolectomy  . BACK SURGERY  1976/1979   lower  . BILIARY DILATION N/A 03/03/2020   Procedure: BILIARY DILATION;  Surgeon: Rogene Houston, MD;  [facility];  Service: Endoscopy;  Laterality: N/A;  . CATARACT EXTRACTION W/PHACO Right 03/08/2018   Procedure: CATARACT EXTRACTION PHACO AND INTRAOCULAR LENS PLACEMENT RIGHT EYE;  Surgeon: Tonny Branch, MD;  Location: [facility] ORS;  Service: Ophthalmology;  Laterality: Right;  CDE: 8.86  . CATARACT EXTRACTION W/PHACO Left 04/05/2018   Procedure: CATARACT  EXTRACTION PHACO AND INTRAOCULAR LENS PLACEMENT (IOC);  Surgeon: Tonny Branch, MD;  Location: [facility] ORS;  Service: Ophthalmology;  Laterality: Left;  CDE: 7.36  . CHOLECYSTECTOMY     Dr. Tamala Julian  . COLON SURGERY  09/2005   Fleishman: four tubular adenomas, large adenomatous polyp with HIGH GRADE dysplasia  . COLONOSCOPY  11/2004   Dr. Sharol Roussel sessile polyp splenic flexure, 79mm sessile polyp desc colon, tubulovillous adenoma (bx not removed)  . COLONOSCOPY  01/2005   poor prep, polyp could not be found  . COLONOSCOPY  05/2005   with EMR, polypectomy Dr. Olegario Messier, bx showed high grade dysplasia, partially resected  . COLONOSCOPY  09/2005   Dr. Arsenio Loader, [place] ink tattooing, four villous colon polyp (3 had been missed on previous colonoscopies due to limitations of procedures  . COLONOSCOPY  09/2006   normal TI, no polyps  . COLONOSCOPY  10/2007   Dr. Imogene Burn distal mammillations, benign bx, normal TI, random bx neg for microscopic colitis  . CYSTOSCOPY WITH LITHOLAPAXY N/A 07/27/2018   Procedure: CYSTOSCOPY WITH LITHOLAPAXY VIA  SUPRAPUBIC TUBE;  Surgeon: Franchot Gallo,  MD;  Location: [facility] OR[facility];  Service: Urology;  Laterality: N/A;  . CYSTOSCOPY WITH RETROGRADE PYELOGRAM, URETEROSCOPY AND STENT PLACEMENT Left 06/09/2017   Procedure: CYSTOSCOPY WITH LEFT RETROGRADE PYELOGRAM, LEFT URETEROSCOPY, LEFT URETEROSCOPIC STONE EXTRACTION, LEFT URETERAL STENT PLACEMENT;  Surgeon: Franchot Gallo, MD;  Location: [facility];  Service: Urology;  Laterality: Left;  . CYSTOSCOPY/URETEROSCOPY/HOLMIUM LASER/STENT PLACEMENT Left 05/22/2020   Procedure: CYSTOSCOPY/URETEROSCOPY/STENT PLACEMENT;  Surgeon: Franchot Gallo, MD;  Location: [facility];  Service: Urology;  Laterality: Left;  . ERCP N/A 03/03/2020   Procedure: ENDOSCOPIC RETROGRADE CHOLANGIOPANCREATOGRAPHY (ERCP);  Surgeon: Rogene Houston, MD;  Location[facility];  Service: Endoscopy;  Laterality: N/A;  . ERCP N/A 04/20/2020   Procedure:  ENDOSCOPIC RETROGRADE CHOLANGIOPANCREATOGRAPHY (ERCP);  Surgeon: Rogene Houston, MD;  Location[facility];  Service: Endoscopy;  Laterality: N/A;  to be done at 7:30am in OR  . GASTROINTESTINAL STENT REMOVAL N/A 04/20/2020   Procedure: STENT REMOVAL;  Surgeon: Rogene Houston, MD;  [facility];  Service: Endoscopy;  Laterality: N/A;  . INGUINAL HERNIA REPAIR  1971   bilateral  . INSERTION OF SUPRAPUBIC CATHETER  06/09/2017   Procedure: EXCHANGE OF SUPRAPUBIC CATHETER;  Surgeon: Franchot Gallo, MD;  Location: [facility];  Service: Urology;;  . INSERTION OF SUPRAPUBIC CATHETER  05/22/2020   Procedure: SUPRAPUBIC CATHETER EXCHANGE;  Surgeon: Franchot Gallo, MD;  Location: [facility];  Service: Urology;;  . IR NEPHROSTOMY PLACEMENT LEFT  05/26/2017  . IR NEPHROSTOMY PLACEMENT LEFT  05/02/2020  . KIDNEY STONE SURGERY  09/13/2015  . LITHOTRIPSY N/A 03/03/2020   Procedure: MECHANICAL LITHOTRIPSY WITH REMOVAL OF MULTIPLE STONE FRAGMENTS;  Surgeon: Rogene Houston, MD;  [facility];  Service: Endoscopy;  Laterality: N/A;  . NEPHROLITHOTOMY Left 09/13/2015   Procedure: LEFT PERCUTANEOUS NEPHROLITHOTOMY ;  Surgeon: Franchot Gallo, MD;  Location: [facility];  Service: Urology;  Laterality: Left;  . NEPHROSTOMY TUBE REMOVAL  05/22/2020   Procedure: NEPHROSTOMY TUBE REMOVAL;  Surgeon: Franchot Gallo, MD;  Location: [facility];  Service: Urology;;  . REMOVAL OF STONES N/A 04/20/2020   Procedure: REMOVAL OF STONES;  Surgeon: Rogene Houston, MD;  [facility];  Service: Endoscopy;  Laterality: N/A;  . SPHINCTEROTOMY  03/03/2020   Procedure: BILLARY SPHINCTEROTOMY;  Surgeon: Rogene Houston, MD;  [facility];  Service: Endoscopy;;  . SUPRAPUBIC CATHETER INSERTION         Family History  Problem Relation Age of Onset  . Cirrhosis Brother        etoh  . Stroke Mother 27  . Coronary artery disease Father 68  . Heart attack Brother   . Cancer Sister   .  Multiple sclerosis Other   . Colon cancer Neg Hx     Social History   Tobacco Use  . Smoking status: Former Smoker    Packs/day: 1.00    Years: 25.00    Pack years: 25.00    Types: Cigarettes    Quit date: 03/03/1988    Years since quitting: 32.3  . Smokeless tobacco: Never Used  Vaping Use  . Vaping Use: Never used  Substance Use Topics  . Alcohol use: No  . Drug use: No    Home Medications Prior to Admission medications   Medication Sig Start Date End Date Taking? Authorizing Provider  amLODipine (NORVASC) 10 MG tablet Take 1 tablet (10 mg total) by mouth daily. Patient taking differently: Take 5 mg by mouth in the morning and at  bedtime.  05/07/20   Eugenie Filler, MD  carvedilol (COREG) 6.25 MG tablet Take 6.25 mg by mouth 2 (two) times daily with a meal.    [provider]  diazepam (VALIUM) 5 MG tablet TAKE (1) TABLET BY MOUTH TWICE DAILY. 05/16/20   Kathrynn Ducking, MD  HYDROcodone-acetaminophen (NORCO) 7.5-325 MG per tablet Take 1 tablet by mouth 2 (two) times daily. Max APAP 3 GM IN 24 HOURS FROM ALL SOURCES Patient taking differently: Take 1 tablet by mouth in the morning and at bedtime. ALL MEDICATIONS TO BE CRUSHED AND PLACED IN APPLESAUCE 08/22/14   Blanchie Serve, MD  levothyroxine (SYNTHROID) 75 MCG tablet Take 75 mcg by mouth daily before breakfast. ALL MEDICATIONS TO BE CRUSHED AND PLACED IN APPLESAUCE 12/21/19   [provider]  mirabegron ER (MYRBETRIQ) 25 MG TB24 tablet Take 1 tablet (25 mg total) by mouth daily as needed (bladder spasms). 05/07/20   Eugenie Filler, MD  potassium chloride SA (KLOR-CON) 20 MEQ tablet Take 20 mEq by mouth daily. 05/15/20   [provider]  sulfamethoxazole-trimethoprim (BACTRIM DS) 800-160 MG tablet Take 1 tablet by mouth 2 (two) times daily. 06/19/20   Franchot Gallo, MD    Allergies    Tetracyclines & related and Ciprofloxacin  Review of Systems   Review of Systems  Constitutional: Positive  for activity change and fatigue. Negative for chills and fever.  HENT: Negative for congestion and sore throat.   Respiratory: Positive for cough. Negative for shortness of breath.   Cardiovascular: Negative for chest pain.  Gastrointestinal: Negative for abdominal pain, diarrhea, nausea and vomiting.  Musculoskeletal: Negative for arthralgias.  Psychiatric/Behavioral: Negative for confusion.  All other systems reviewed and are negative.   Physical Exam Updated Vital Signs BP 114/87   Pulse 60   Temp 98.7 F (37.1 C) (Oral)   Resp 18   SpO2 99%   Physical Exam Vitals and nursing note reviewed.  Constitutional:      Appearance: Normal appearance.     Comments: Thin, tired-appearing older man  HENT:     Head: Normocephalic and atraumatic.  Eyes:     Conjunctiva/sclera: Conjunctivae normal.     Pupils: Pupils are equal, round, and reactive to light.  Cardiovascular:     Rate and Rhythm: Normal rate and regular rhythm.     Pulses: Normal pulses.     Heart sounds: Normal heart sounds. No murmur heard.  No friction rub. No gallop.   Pulmonary:     Effort: Pulmonary effort is normal. No respiratory distress.     Breath sounds: Rhonchi and rales present. No wheezing.  Abdominal:     General: Abdomen is flat. Bowel sounds are normal. There is no distension.     Palpations: Abdomen is soft.     Tenderness: There is no abdominal tenderness.  Musculoskeletal:     Right lower leg: No edema.     Left lower leg: No edema.  Skin:    General: Skin is warm and dry.  Neurological:     General: No focal deficit present.     Mental Status: He is alert and oriented to person, place, and time.  Psychiatric:        Mood and Affect: Mood normal.        Behavior: Behavior normal.    ED Results / Procedures / Treatments   Labs (all labs ordered are listed, but only abnormal results are displayed) Labs Reviewed  CBC - Abnormal; Notable for  the following components:      Result Value    RBC 3.91 (*)    Hemoglobin 12.1 (*)    HCT 37.4 (*)    Platelets 100 (*)    All other components within normal limits  COMPREHENSIVE METABOLIC PANEL - Abnormal; Notable for the following components:   Sodium 147 (*)    Glucose, Bld 111 (*)    BUN 55 (*)    Creatinine, Ser 2.94 (*)    Calcium 8.3 (*)    Albumin 3.2 (*)    AST 69 (*)    GFR calc non Af Amer 20 (*)    All other components within normal limits  D-DIMER, QUANTITATIVE (NOT AT [facility]) - Abnormal; Notable for the following components:   D-Dimer, Quant 2.42 (*)    All other components within normal limits  C-REACTIVE PROTEIN - Abnormal; Notable for the following components:   CRP 12.4 (*)    All other components within normal limits  FERRITIN - Abnormal; Notable for the following components:   Ferritin 804 (*)    All other components within normal limits  LACTATE DEHYDROGENASE - Abnormal; Notable for the following components:   LDH 411 (*)    All other components within normal limits  CULTURE, BLOOD (ROUTINE X 2)  CULTURE, BLOOD (ROUTINE X 2)  LACTIC ACID, PLASMA  PROCALCITONIN  TRIGLYCERIDES  FIBRINOGEN  LACTIC ACID, PLASMA    EKG EKG Interpretation  Date/Time:  Tuesday July 03 2020 14:09:20 EDT Ventricular Rate:  58 PR Interval:    QRS Duration: 120 QT Interval:  422 QTC Calculation: 415 R Axis:   67 Text Interpretation: Sinus rhythm Atrial premature complex Short PR interval Incomplete left bundle branch block No significant change since prior 8/21 Confirmed by Aletta Edouard [phone]) on 07/03/2020 3:53:30 PM   Radiology DG Chest Port 1 View  Result Date: 07/03/2020 CLINICAL DATA:  Shortness of breath EXAM: PORTABLE CHEST 1 VIEW COMPARISON:  May 02, 2020 FINDINGS: There is patchy ill-defined opacity in the right mid lung and right base regions. Lungs elsewhere are clear. Heart size and pulmonary vascularity are normal. No adenopathy. No bone lesions. IMPRESSION: Ill-defined opacity right mid lung and  right base regions, likely representing atypical organism pneumonia. Lungs elsewhere clear. Heart size normal. No adenopathy evident. Electronically Signed   By: Lowella Grip III M.D.   On: 07/03/2020 12:43   Procedures Procedures (including critical care time)  Medications Ordered in ED Medications  remdesivir 200 mg in sodium chloride 0.9% 250 mL IVPB (has no administration in time range)    Followed by  remdesivir 100 mg in sodium chloride 0.9 % 100 mL IVPB (has no administration in time range)  dexamethasone (DECADRON) injection 6 mg (6 mg Intravenous Given 07/03/20 1315)  sodium chloride 0.9 % bolus 1,000 mL (0 mLs Intravenous Stopped 07/03/20 1629)    ED Course  I have reviewed the triage vital signs and the nursing notes.  Pertinent labs & imaging results that were available during my care of the patient were reviewed by me and considered in my medical decision making (see chart for details).    MDM Rules/Calculators/A&P                          79 yo man, unvaccinated, presents with acute hypoxic respiratory failure in setting of COVID-19 infection. Will obtain baseline COVID-19 labs, start remdesivir and decadron, and will call for admission.  Awaiting CMP. Patient with  some hypotension 80s/60s, 90s/50s. Given 1L bolus. With improvement to 110s/80s. Signed out to oncoming provider. Expect patient to be admitted. Started decadron, awaiting to start remdesivir for when LFTs return.  Final Clinical Impression(s) / ED Diagnoses Final diagnoses:  Acute hypoxemic respiratory failure due to COVID-19 [facility])  AKI (acute kidney injury) [facility])    Rx / DC Orders ED Discharge Orders    None       Gladys Damme, MD 07/03/20 2091    Gareth Morgan, MD 07/03/20 2234

## 2020-07-03 NOTE — ED Provider Notes (Signed)
Signout from Dr. Billy Fischer.  79 year old male history of MS here with hypoxia after being seen in the infusion center earlier today.  Currently on 6 L nasal cannula.  Blood pressure soft.  Getting IV fluids.  Plan is to follow-up on lab work and admit the patient. Physical Exam  BP 94/61   Pulse (!) 59   Temp 98.3 F (36.8 C) (Oral)   Resp 16   SpO2 100%   Physical Exam  ED Course/Procedures     Procedures  MDM  Discussed with Dr. Neysa Bonito who will evaluate the patient for admission.       Hayden Rasmussen, MD 07/04/20 1047

## 2020-07-03 NOTE — H&P (Addendum)
History and Physical        Hospital Admission Note Date: 07/03/2020  Patient name: Jordan Ward Medical record number: [id number] Date of birth: April 15, 1941 Age: 79 y.o. Gender: male  PCP: Noreene Larsson, NP  Patient coming from: Home Lives with: Wife At baseline, ambulates: Psychiatric nurse Complaint  Patient presents with  . Weakness      HPI:   Patient is a poor historian and much of the history is obtained from prior notes  This is a 79 year old male who has not been vaccinated against COVID-19 with a complex medical history including MS with suprapubic catheter and recurrent Proteus UTIs, HFpEF, recent left ureteroscopy with stone extraction and stent placement on 8/24, CKD 3b, vascular disease, hypertension, stroke with left-sided weakness who presented today to the ED from the monoclonal antibody infusion center with acute hypoxic respiratory failure.  He was diagnosed with COVID-19 on October 1 but is unsure of when his symptoms first started but started with a mild cough.  Wife is a positive sick contact.  He was referred to the infusion center and was noted to be desaturating to the 70s on room air and was transferred to the ED initially requiring 7 LPM to maintain sats greater than 90%.  No fever, chills, nausea, vomiting, diarrhea, rash.  ED Course: Afebrile, bradycardic (58), hypotensive (124/75 -> 89/61 ->95/53) requiring 6 LPM.  Notable labs: Sodium 147, glucose 111, BUN 55, creatinine 2.94 (previously 1.74 on 8/9, baseline 1.3), LDH, ferritin 804, CRP 12.4, procalcitonin negative, lactic acid 1.3, WBC 4.7, Hb 12, platelets 100, D-dimer 2.4, fibrinogen 431, COVID-19 positive.  CXR with RML and RLL ill-defined opacity.  Vitals:   07/03/20 1600 07/03/20 1615  BP: 95/63 114/87  Pulse: (!) 59 60  Resp: 18 18  Temp:  98.7 F (37.1 C)  SpO2: 93% 99%      Review of Systems:  Review of Systems  All other systems reviewed and are negative.   Medical/Social/Family History   Past Medical History: Past Medical History:  Diagnosis Date  . Anemia   . Arthritis   . Back pain, chronic   . Bilateral carotid bruits   . C. difficile colitis 09/2011  . CAD (coronary artery disease)   . Carotid artery stenosis   . Cerebrovascular disease   . Cerebrovascular disease 08/17/2018  . Colon polyps   . Complication of cystostomy catheter, initial encounter ([place]) 04/20/2020  . Dyslipidemia   . Dysphagia 10/07/2011  . Encephalopathy   . Gait disorder   . HA (headache)   . High grade dysplasia in colonic adenoma 09/2005  . History of kidney stones   . HTN (hypertension), malignant 10/06/2011  . Hypernatremia   . Hypokalemia   . Hypothyroidism 10/08/2011  . Insomnia   . Junctional rhythm   . Kidney stones   . MS (multiple sclerosis) ([place])   . Neuromuscular disorder (HCC)    MS  . OSA (obstructive sleep apnea)   . Paroxysmal atrial tachycardia ([place])   . Peripheral vascular disease ([place])   . Pneumonia 4 yrs ago  . Pulmonary fibrosis ([place]) 10/06/2011  . Pulmonary nodule 10/08/2011  . PVD (peripheral vascular  disease) ([place])   . Sacral ulcer ([place])   . Sleep apnea    cannot tolerate  . Stroke [facility])    left sided weakness  . Suprapubic catheter ([place])   . TIA (transient ischemic attack)   . Tremors of nervous system 10/08/2011  . Urinary tract infection   . Ventral hernia without obstruction or gangrene     Large 8X9cm ventral hernia with loss of domain. CT reads report as diastasis recti with herniation or diastasis recti.  Dr. Constance Haw, Surgery, reviewed CT with radiology and there is herniation with only hernia sac or peritoneum over the bowel and large separation of the rectus muslce (i.e. diastasis recti aka loss of domain).  No surgical intervention recommended given size, age, and health.     Past Surgical History:  Procedure Laterality Date   . APPENDECTOMY  09/2005   at time of left hemicolectomy  . BACK SURGERY  1976/1979   lower  . BILIARY DILATION N/A 03/03/2020   Procedure: BILIARY DILATION;  Surgeon: Rogene Houston, MD;  [facility];  Service: Endoscopy;  Laterality: N/A;  . CATARACT EXTRACTION W/PHACO Right 03/08/2018   Procedure: CATARACT EXTRACTION PHACO AND INTRAOCULAR LENS PLACEMENT RIGHT EYE;  Surgeon: Tonny Branch, MD;  Location: [facility] ORS;  Service: Ophthalmology;  Laterality: Right;  CDE: 8.86  . CATARACT EXTRACTION W/PHACO Left 04/05/2018   Procedure: CATARACT EXTRACTION PHACO AND INTRAOCULAR LENS PLACEMENT (IOC);  Surgeon: Tonny Branch, MD;  Location: [facility] ORS;  Service: Ophthalmology;  Laterality: Left;  CDE: 7.36  . CHOLECYSTECTOMY     Dr. Tamala Julian  . COLON SURGERY  09/2005   [place]man: four tubular adenomas, large adenomatous polyp with HIGH GRADE dysplasia  . COLONOSCOPY  11/2004   Dr. Sharol Roussel sessile polyp splenic flexure, 75mm sessile polyp desc colon, tubulovillous adenoma (bx not removed)  . COLONOSCOPY  01/2005   poor prep, polyp could not be found  . COLONOSCOPY  05/2005   with EMR, polypectomy Dr. Olegario Messier, bx showed high grade dysplasia, partially resected  . COLONOSCOPY  09/2005   Dr. Arsenio Loader, [place] ink tattooing, four villous colon polyp (3 had been missed on previous colonoscopies due to limitations of procedures  . COLONOSCOPY  09/2006   normal TI, no polyps  . COLONOSCOPY  10/2007   Dr. Imogene Burn distal mammillations, benign bx, normal TI, random bx neg for microscopic colitis  . CYSTOSCOPY WITH LITHOLAPAXY N/A 07/27/2018   Procedure: CYSTOSCOPY WITH LITHOLAPAXY VIA  SUPRAPUBIC TUBE;  Surgeon: Franchot Gallo, MD;  Location: [facility];  Service: Urology;  Laterality: N/A;  . CYSTOSCOPY WITH RETROGRADE PYELOGRAM, URETEROSCOPY AND STENT PLACEMENT Left 06/09/2017   Procedure: CYSTOSCOPY WITH LEFT RETROGRADE PYELOGRAM, LEFT URETEROSCOPY, LEFT URETEROSCOPIC STONE EXTRACTION, LEFT URETERAL  STENT PLACEMENT;  Surgeon: Franchot Gallo, MD;  Location: [facility];  Service: Urology;  Laterality: Left;  . CYSTOSCOPY/URETEROSCOPY/HOLMIUM LASER/STENT PLACEMENT Left 05/22/2020   Procedure: CYSTOSCOPY/URETEROSCOPY/STENT PLACEMENT;  Surgeon: Franchot Gallo, MD;  Location: [facility];  Service: Urology;  Laterality: Left;  . ERCP N/A 03/03/2020   Procedure: ENDOSCOPIC RETROGRADE CHOLANGIOPANCREATOGRAPHY (ERCP);  Surgeon: Rogene Houston, MD;  [facility];  Service: Endoscopy;  Laterality: N/A;  . ERCP N/A 04/20/2020   Procedure: ENDOSCOPIC RETROGRADE CHOLANGIOPANCREATOGRAPHY (ERCP);  Surgeon: Rogene Houston, MD;  [facility];  Service: Endoscopy;  Laterality: N/A;  to be done at 7:30am in OR  . GASTROINTESTINAL STENT REMOVAL N/A 04/20/2020   Procedure: STENT REMOVAL;  Surgeon: Rogene Houston, MD;  [facility]  [facility];  Service: Endoscopy;  Laterality: N/A;  . INGUINAL HERNIA REPAIR  1971   bilateral  . INSERTION OF SUPRAPUBIC CATHETER  06/09/2017   Procedure: EXCHANGE OF SUPRAPUBIC CATHETER;  Surgeon: Franchot Gallo, MD;  Location: [facility];  Service: Urology;;  . INSERTION OF SUPRAPUBIC CATHETER  05/22/2020   Procedure: SUPRAPUBIC CATHETER EXCHANGE;  Surgeon: Franchot Gallo, MD;  Location: [facility];  Service: Urology;;  . IR NEPHROSTOMY PLACEMENT LEFT  05/26/2017  . IR NEPHROSTOMY PLACEMENT LEFT  05/02/2020  . KIDNEY STONE SURGERY  09/13/2015  . LITHOTRIPSY N/A 03/03/2020   Procedure: MECHANICAL LITHOTRIPSY WITH REMOVAL OF MULTIPLE STONE FRAGMENTS;  Surgeon: Rogene Houston, MD;  [facility];  Service: Endoscopy;  Laterality: N/A;  . NEPHROLITHOTOMY Left 09/13/2015   Procedure: LEFT PERCUTANEOUS NEPHROLITHOTOMY ;  Surgeon: Franchot Gallo, MD;  Location: [facility];  Service: Urology;  Laterality: Left;  . NEPHROSTOMY TUBE REMOVAL  05/22/2020   Procedure: NEPHROSTOMY TUBE REMOVAL;  Surgeon: Franchot Gallo, MD;  Location: [facility];  Service: Urology;;  .  REMOVAL OF STONES N/A 04/20/2020   Procedure: REMOVAL OF STONES;  Surgeon: Rogene Houston, MD;  [facility];  Service: Endoscopy;  Laterality: N/A;  . SPHINCTEROTOMY  03/03/2020   Procedure: BILLARY SPHINCTEROTOMY;  Surgeon: Rogene Houston, MD;  [facility];  Service: Endoscopy;;  . SUPRAPUBIC CATHETER INSERTION      Medications: Prior to Admission medications   Medication Sig Start Date End Date Taking? Authorizing Provider  amLODipine (NORVASC) 10 MG tablet Take 1 tablet (10 mg total) by mouth daily. Patient taking differently: Take 5 mg by mouth in the morning and at bedtime.  05/07/20   Eugenie Filler, MD  carvedilol (COREG) 6.25 MG tablet Take 6.25 mg by mouth 2 (two) times daily with a meal.    [provider]  diazepam (VALIUM) 5 MG tablet TAKE (1) TABLET BY MOUTH TWICE DAILY. 05/16/20   Kathrynn Ducking, MD  HYDROcodone-acetaminophen (NORCO) 7.5-325 MG per tablet Take 1 tablet by mouth 2 (two) times daily. Max APAP 3 GM IN 24 HOURS FROM ALL SOURCES Patient taking differently: Take 1 tablet by mouth in the morning and at bedtime. ALL MEDICATIONS TO BE CRUSHED AND PLACED IN APPLESAUCE 08/22/14   Blanchie Serve, MD  levothyroxine (SYNTHROID) 75 MCG tablet Take 75 mcg by mouth daily before breakfast. ALL MEDICATIONS TO BE CRUSHED AND PLACED IN APPLESAUCE 12/21/19   [provider]  mirabegron ER (MYRBETRIQ) 25 MG TB24 tablet Take 1 tablet (25 mg total) by mouth daily as needed (bladder spasms). 05/07/20   Eugenie Filler, MD  potassium chloride SA (KLOR-CON) 20 MEQ tablet Take 20 mEq by mouth daily. 05/15/20   [provider]  sulfamethoxazole-trimethoprim (BACTRIM DS) 800-160 MG tablet Take 1 tablet by mouth 2 (two) times daily. 06/19/20   Franchot Gallo, MD    Allergies:   Allergies  Allergen Reactions  . Tetracyclines & Related Anaphylaxis and Rash  . Ciprofloxacin     Trouble swallowing unknown reaction according to wife      Social History:  reports that he quit smoking about 32 years ago. His smoking use included cigarettes. He has a 25.00 pack-year smoking history. He has never used smokeless tobacco. He reports that he does not drink alcohol and does not use drugs.  Family History: Family History  Problem Relation Age of Onset  . Cirrhosis Brother        etoh  .  Stroke Mother 69  . Coronary artery disease Father 42  . Heart attack Brother   . Cancer Sister   . Multiple sclerosis Other   . Colon cancer Neg Hx      Objective   Physical Exam: Blood pressure 114/87, pulse 60, temperature 98.7 F (37.1 C), temperature source Oral, resp. rate 18, SpO2 99 %.  Physical Exam Vitals and nursing note reviewed.  Constitutional:      Appearance: Normal appearance.     Comments: Frail elderly male Answers are delayed  HENT:     Head: Normocephalic and atraumatic.  Eyes:     Conjunctiva/sclera: Conjunctivae normal.  Cardiovascular:     Rate and Rhythm: Normal rate.     Pulses: Normal pulses.  Pulmonary:     Effort: Pulmonary effort is normal. No respiratory distress.  Abdominal:     General: Abdomen is flat.     Palpations: Abdomen is soft.  Musculoskeletal:        General: No swelling or tenderness.  Skin:    Coloration: Skin is not jaundiced or pale.  Neurological:     Mental Status: He is alert and oriented to person, place, and time. Mental status is at baseline.  Psychiatric:        Mood and Affect: Mood normal.        Behavior: Behavior normal.     LABS on Admission: I have personally reviewed all the labs and imaging below    Basic Metabolic Panel: Recent Labs  Lab 07/03/20 1401  NA 147*  K 3.7  CL 110  CO2 25  GLUCOSE 111*  BUN 55*  CREATININE 2.94*  CALCIUM 8.3*   Liver Function Tests: Recent Labs  Lab 07/03/20 1401  AST 69*  ALT 23  ALKPHOS 55  BILITOT 1.2  PROT 7.3  ALBUMIN 3.2*   No results for input(s): LIPASE, AMYLASE in the last 168 hours. No  results for input(s): AMMONIA in the last 168 hours. CBC: Recent Labs  Lab 07/03/20 1401  WBC 4.7  HGB 12.1*  HCT 37.4*  MCV 95.7  PLT 100*   Cardiac Enzymes: No results for input(s): CKTOTAL, CKMB, CKMBINDEX, TROPONINI in the last 168 hours. BNP: Invalid input(s): POCBNP CBG: No results for input(s): GLUCAP in the last 168 hours.  Radiological Exams on Admission:  DG Chest Port 1 View  Result Date: 07/03/2020 CLINICAL DATA:  Shortness of breath EXAM: PORTABLE CHEST 1 VIEW COMPARISON:  May 02, 2020 FINDINGS: There is patchy ill-defined opacity in the right mid lung and right base regions. Lungs elsewhere are clear. Heart size and pulmonary vascularity are normal. No adenopathy. No bone lesions. IMPRESSION: Ill-defined opacity right mid lung and right base regions, likely representing atypical organism pneumonia. Lungs elsewhere clear. Heart size normal. No adenopathy evident. Electronically Signed   By: Lowella Grip III M.D.   On: 07/03/2020 12:43      EKG: Independently reviewed.    A & P   Principal Problem:   Acute hypoxemic respiratory failure due to COVID-19 [facility] [place]) Active Problems:   Multiple sclerosis (HCC)   Hypothyroidism   Chronic diastolic (congestive) heart failure (HCC)   Essential hypertension   Cerebrovascular disease   Hypotension   Acute kidney injury superimposed on CKD ([place])   1. Acute hypoxic respiratory failure secondary to COVID-19  Possibly developing COVID encephalopathy O2 Requirements: 0 L/min at baseline, currently 6 L/min. AAOx3 but delayed responses CRP 12.4, D Dimer 2.42, Procalcitonin negative - hold off antibiotics for  now Remdesivir x 5 days, Steroids x 10 days, Start Baricitinib (patient agreed to taking this medication after risks and benefits were discussed) Inhalers, antitussives Encourage Incentive Spirometry, Flutter Valve and Pronation as tolerated Unfortunately he has a poor prognosis given his underlying  comorbidities  2. Multiple sclerosis with neurogenic bladder and suprapubic catheter  a. Not on any Biologics or immunomodulators b. Continue home pain regimen: Norco and Valium c. PT  3. Compensated HFpEF a. Holding home Coreg due to hypotension  4. AKI on CKD 3b a. Baseline creatinine 1.3-1.5, currently 2.94 b. Probably from hemodynamic changes c. IV fluids d. Hold antihypertensives  5. Hypertension now with hypotension a. BP SBP low 90s to low 100s and fluctuating throughout his stay b. Hold home antihypertensives  6. Hypothyroidism a. Continue home Synthroid  7. History of stroke with left-sided weakness   DVT prophylaxis: Heparin   Code Status: Prior  Diet: Regular Family Communication: Admission, patients condition and plan of care including tests being ordered have been discussed with the patient who indicates understanding and agrees with the plan and Code Status. I called the patient's wife but she did not answer. Addendum: I was able to get in touch with the patient's wife over the phone. Disposition Plan: The appropriate patient status for this patient is INPATIENT. Inpatient status is judged to be reasonable and necessary in order to provide the required intensity of service to ensure the patient's safety. The patient's presenting symptoms, physical exam findings, and initial radiographic and laboratory data in the context of their chronic comorbidities is felt to place them at high risk for further clinical deterioration. Furthermore, it is not anticipated that the patient will be medically stable for discharge from the hospital within 2 midnights of admission. The following factors support the patient status of inpatient.   " The patient's presenting symptoms include hypoxia, shortness of breath. " The worrisome physical exam findings include frail, lower extremity weakness. " The initial radiographic and laboratory data are worrisome because of AKI, COVID-19 positive,  elevated inflammatory markers. " The chronic co-morbidities include MS, vasculopathy, CKD 3b, hypertension   * I certify that at the point of admission it is my clinical judgment that the patient will require inpatient hospital care spanning beyond 2 midnights from the point of admission due to high intensity of service, high risk for further deterioration and high frequency of surveillance required.*   Status is: Inpatient  Remains inpatient appropriate because:IV treatments appropriate due to intensity of illness or inability to take PO and Inpatient level of care appropriate due to severity of illness   Dispo: The patient is from: Home              Anticipated d/c is to: TBD              Anticipated d/c date is: > 3 days              Patient currently is not medically stable to d/c.    Consultants  . None  Procedures  . None  Time Spent on Admission: 65 minutes    Harold Hedge, DO [facility]  07/03/2020, 6:16 PM

## 2020-07-04 DIAGNOSIS — U071 COVID-19: Secondary | ICD-10-CM | POA: Diagnosis not present

## 2020-07-04 DIAGNOSIS — J9601 Acute respiratory failure with hypoxia: Secondary | ICD-10-CM | POA: Diagnosis not present

## 2020-07-04 LAB — CBC WITH DIFFERENTIAL/PLATELET
Abs Immature Granulocytes: 0.01 10*3/uL (ref 0.00–0.07)
Basophils Absolute: 0 10*3/uL (ref 0.0–0.1)
Basophils Relative: 0 %
Eosinophils Absolute: 0 10*3/uL (ref 0.0–0.5)
Eosinophils Relative: 0 %
HCT: 37.1 % — ABNORMAL LOW (ref 39.0–52.0)
Hemoglobin: 11.4 g/dL — ABNORMAL LOW (ref 13.0–17.0)
Immature Granulocytes: 0 %
Lymphocytes Relative: 28 %
Lymphs Abs: 0.9 10*3/uL (ref 0.7–4.0)
MCH: 30.5 pg (ref 26.0–34.0)
MCHC: 30.7 g/dL (ref 30.0–36.0)
MCV: 99.2 fL (ref 80.0–100.0)
Monocytes Absolute: 0.1 10*3/uL (ref 0.1–1.0)
Monocytes Relative: 3 %
Neutro Abs: 2.1 10*3/uL (ref 1.7–7.7)
Neutrophils Relative %: 69 %
Platelets: 86 10*3/uL — ABNORMAL LOW (ref 150–400)
RBC: 3.74 MIL/uL — ABNORMAL LOW (ref 4.22–5.81)
RDW: 14.2 % (ref 11.5–15.5)
WBC: 3.1 10*3/uL — ABNORMAL LOW (ref 4.0–10.5)
nRBC: 0 % (ref 0.0–0.2)

## 2020-07-04 LAB — COMPREHENSIVE METABOLIC PANEL
ALT: 20 U/L (ref 0–44)
AST: 56 U/L — ABNORMAL HIGH (ref 15–41)
Albumin: 2.9 g/dL — ABNORMAL LOW (ref 3.5–5.0)
Alkaline Phosphatase: 52 U/L (ref 38–126)
Anion gap: 8 (ref 5–15)
BUN: 52 mg/dL — ABNORMAL HIGH (ref 8–23)
CO2: 24 mmol/L (ref 22–32)
Calcium: 8.2 mg/dL — ABNORMAL LOW (ref 8.9–10.3)
Chloride: 116 mmol/L — ABNORMAL HIGH (ref 98–111)
Creatinine, Ser: 2.17 mg/dL — ABNORMAL HIGH (ref 0.61–1.24)
GFR calc non Af Amer: 28 mL/min — ABNORMAL LOW (ref >60)
Glucose, Bld: 186 mg/dL — ABNORMAL HIGH (ref 70–99)
Potassium: 3 mmol/L — ABNORMAL LOW (ref 3.5–5.1)
Sodium: 148 mmol/L — ABNORMAL HIGH (ref 135–145)
Total Bilirubin: 0.6 mg/dL (ref 0.3–1.2)
Total Protein: 6.8 g/dL (ref 6.5–8.1)

## 2020-07-04 LAB — URINALYSIS, ROUTINE W REFLEX MICROSCOPIC
Bilirubin Urine: NEGATIVE
Glucose, UA: NEGATIVE mg/dL
Ketones, ur: NEGATIVE mg/dL
Nitrite: POSITIVE — AB
Protein, ur: 30 mg/dL — AB
Specific Gravity, Urine: 1.013 (ref 1.005–1.030)
WBC, UA: >50 WBC/hpf — ABNORMAL HIGH (ref 0–5)
pH: 6 (ref 5.0–8.0)

## 2020-07-04 LAB — HEMOGLOBIN A1C
Hgb A1c MFr Bld: 6.3 % — ABNORMAL HIGH (ref 4.8–5.6)
Mean Plasma Glucose: 134.11 mg/dL

## 2020-07-04 LAB — D-DIMER, QUANTITATIVE: D-Dimer, Quant: 1.95 ug/mL-FEU — ABNORMAL HIGH (ref 0.00–0.50)

## 2020-07-04 LAB — FERRITIN: Ferritin: 837 ng/mL — ABNORMAL HIGH (ref 24–336)

## 2020-07-04 LAB — C-REACTIVE PROTEIN: CRP: 12.7 mg/dL — ABNORMAL HIGH (ref <1.0)

## 2020-07-04 LAB — GLUCOSE, CAPILLARY: Glucose-Capillary: 241 mg/dL — ABNORMAL HIGH (ref 70–99)

## 2020-07-04 MED ORDER — POTASSIUM CHLORIDE CRYS ER 20 MEQ PO TBCR
40.0000 meq | EXTENDED_RELEASE_TABLET | Freq: Once | ORAL | Status: AC
  Administered 2020-07-04: 40 meq via ORAL
  Filled 2020-07-04: qty 2

## 2020-07-04 MED ORDER — INSULIN ASPART 100 UNIT/ML ~~LOC~~ SOLN
0.0000 [IU] | Freq: Every day | SUBCUTANEOUS | Status: DC
  Administered 2020-07-04: 4 [IU] via SUBCUTANEOUS
  Administered 2020-07-05: 2 [IU] via SUBCUTANEOUS

## 2020-07-04 MED ORDER — INSULIN ASPART 100 UNIT/ML ~~LOC~~ SOLN
0.0000 [IU] | Freq: Three times a day (TID) | SUBCUTANEOUS | Status: DC
  Administered 2020-07-04: 2 [IU] via SUBCUTANEOUS
  Administered 2020-07-04: 3 [IU] via SUBCUTANEOUS
  Administered 2020-07-05: 5 [IU] via SUBCUTANEOUS
  Administered 2020-07-05: 3 [IU] via SUBCUTANEOUS
  Administered 2020-07-05: 1 [IU] via SUBCUTANEOUS
  Administered 2020-07-06: 3 [IU] via SUBCUTANEOUS
  Administered 2020-07-06 – 2020-07-07 (×2): 2 [IU] via SUBCUTANEOUS
  Administered 2020-07-07: 1 [IU] via SUBCUTANEOUS
  Administered 2020-07-07 – 2020-07-08 (×2): 2 [IU] via SUBCUTANEOUS
  Administered 2020-07-08: 1 [IU] via SUBCUTANEOUS
  Administered 2020-07-08: 2 [IU] via SUBCUTANEOUS
  Administered 2020-07-09 (×3): 1 [IU] via SUBCUTANEOUS
  Administered 2020-07-10: 2 [IU] via SUBCUTANEOUS
  Administered 2020-07-10 – 2020-07-11 (×5): 1 [IU] via SUBCUTANEOUS
  Administered 2020-07-12: 3 [IU] via SUBCUTANEOUS
  Administered 2020-07-12 – 2020-07-13 (×2): 1 [IU] via SUBCUTANEOUS
  Administered 2020-07-13: 2 [IU] via SUBCUTANEOUS

## 2020-07-04 MED ORDER — DEXTROSE 5 % IV SOLN: INTRAVENOUS | Status: AC

## 2020-07-04 MED ORDER — POTASSIUM CHLORIDE CRYS ER 20 MEQ PO TBCR: 40.0000 meq | EXTENDED_RELEASE_TABLET | ORAL | Status: DC

## 2020-07-04 MED ORDER — POTASSIUM CHLORIDE CRYS ER 20 MEQ PO TBCR
20.0000 meq | EXTENDED_RELEASE_TABLET | Freq: Once | ORAL | Status: AC
  Administered 2020-07-04: 20 meq via ORAL
  Filled 2020-07-04: qty 1

## 2020-07-04 NOTE — Evaluation (Signed)
Physical Therapy Evaluation Patient Details Name: Jordan Ward MRN: [id number] DOB: Sep 17, 1941 Today's Date: 07/04/2020   History of Present Illness  79 yo male admitted with COVID. Hx of MS, HF, CKD, CVA with L sided weakness, CAD, gait d/o, PVD, neurogenic bladder  Clinical Impression  On eval, pt was Min assist for mobility. He walked ~50 feet with a RW. O2 85% on RA at rest, 89% on 3L after ambulation. Discussed d/c plan-pt plans to return home where he lives with his wife. Will plan to follow pt during hospital stay.     Follow Up Recommendations Home health PT;Supervision/Assistance - 24 hour    Equipment Recommendations  None recommended by PT    Recommendations for Other Services       Precautions / Restrictions Precautions Precautions: Fall Precaution Comments: monitor O2 Restrictions Weight Bearing Restrictions: No      Mobility  Bed Mobility Overal bed mobility: Needs Assistance Bed Mobility: Supine to Sit     Supine to sit: Supervision;HOB elevated     General bed mobility comments: Supervision for safety/lines. Increased time. Cues required.  Transfers Overall transfer level: Needs assistance Equipment used: Rolling walker (2 wheeled) Transfers: Sit to/from Stand Sit to Stand: Min assist         General transfer comment: Assist to rise, stabilize, control descent. VCs safety, hand placement.  Ambulation/Gait Ambulation/Gait assistance: Min assist Gait Distance (Feet): 50 Feet Assistive device: Rolling walker (2 wheeled) Gait Pattern/deviations: Step-through pattern;Decreased stride length     General Gait Details: Assist to stabilize throughout distance. O2 89% on 3L. Dyspnea 2/4.  Stairs            Wheelchair Mobility    Modified Rankin (Stroke Patients Only)       Balance Overall balance assessment: Needs assistance         Standing balance support: Bilateral upper extremity supported Standing balance-Leahy Scale: Poor                                Pertinent Vitals/Pain Pain Assessment: No/denies pain    Home Living Family/patient expects to be discharged to:: Private residence Living Arrangements: Spouse/significant other;Children Available Help at Discharge: Family;Available 24 hours/day Type of Home: House Home Access: Stairs to enter Entrance Stairs-Rails: Right Entrance Stairs-Number of Steps: 2 Home Layout: One level Home Equipment: Walker - 2 wheels;Cane - single point;Grab bars - toilet;Crutches;Adaptive equipment      Prior Function Level of Independence: Needs assistance   Gait / Transfers Assistance Needed: uses RW     Comments: household independent amb per pt. amb with RW when going out.  Wife performs majority of housework and provides transportation as pt no longer has his license.     Hand Dominance        Extremity/Trunk Assessment   Upper Extremity Assessment Upper Extremity Assessment: Defer to OT evaluation    Lower Extremity Assessment Lower Extremity Assessment: Generalized weakness    Cervical / Trunk Assessment Cervical / Trunk Assessment: Kyphotic  Communication   Communication: No difficulties  Cognition Arousal/Alertness: Awake/alert Behavior During Therapy: WFL for tasks assessed/performed Overall Cognitive Status: Within Functional Limits for tasks assessed                                        General Comments      Exercises  Assessment/Plan    PT Assessment Patient needs continued PT services  PT Problem List Decreased strength;Decreased mobility;Decreased activity tolerance;Decreased balance;Decreased knowledge of use of DME       PT Treatment Interventions DME instruction;Gait training;Therapeutic activities;Therapeutic exercise;Patient/family education;Balance training;Functional mobility training    PT Goals (Current goals can be found in the Care Plan section)  Acute Rehab PT Goals Patient Stated  Goal: home PT Goal Formulation: With patient Time For Goal Achievement: 07/18/20 Potential to Achieve Goals: Good    Frequency Min 3X/week   Barriers to discharge        Co-evaluation               AM-PAC PT "6 Clicks" Mobility  Outcome Measure Help needed turning from your back to your side while in a flat bed without using bedrails?: None Help needed moving from lying on your back to sitting on the side of a flat bed without using bedrails?: None Help needed moving to and from a bed to a chair (including a wheelchair)?: A Little Help needed standing up from a chair using your arms (e.g., wheelchair or bedside chair)?: A Little Help needed to walk in hospital room?: A Little Help needed climbing 3-5 steps with a railing? : A Little 6 Click Score: 20    End of Session Equipment Utilized During Treatment: Gait belt;Oxygen Activity Tolerance: Patient tolerated treatment well Patient left: in chair;with call bell/phone within reach;with chair alarm set   PT Visit Diagnosis: Muscle weakness (generalized) (M62.81);History of falling (Z91.81);Difficulty in walking, not elsewhere classified (R26.2)    Time: 2694-8546 PT Time Calculation (min) (ACUTE ONLY): 28 min   Charges:   PT Evaluation $PT Eval Low Complexity: 1 Low PT Treatments $Gait Training: 8-22 mins          Doreatha Massed, PT Acute Rehabilitation  Office: [phone] Pager: [phone]

## 2020-07-04 NOTE — Progress Notes (Signed)
PROGRESS NOTE    Jordan Ward  FFM:[id number] DOB: [birth year] DOA: 07/03/2020 PCP: Noreene Larsson, NP   Chief Complaint  Patient presents with  . Weakness    Brief Narrative:  Per previous HPI Patient is Jordan Ward poor historian and much of the history is obtained from prior notes  This is Jordan Ward 79 year old male who has not been vaccinated against COVID-19 with Irma Roulhac complex medical history including MS with suprapubic catheter and recurrent Proteus UTIs, HFpEF, recent left ureteroscopy with stone extraction and stent placement on 8/24, CKD 3b, vascular disease, hypertension, stroke with left-sided weakness who presented today to the ED from the monoclonal antibody infusion center with acute hypoxic respiratory failure.  He was diagnosed with COVID-19 on October 1 but is unsure of when his symptoms first started but started with Jordan Ward mild cough.  Wife is Jordan Ward positive sick contact.  He was referred to the infusion center and was noted to be desaturating to the 70s on room air and was transferred to the ED initially requiring 7 LPM to maintain sats greater than 90%.  No fever, chills, nausea, vomiting, diarrhea, rash.  ED Course: Afebrile, bradycardic (58), hypotensive (124/75 -> 89/61 ->95/53) requiring 6 LPM.  Notable labs: Sodium 147, glucose 111, BUN 55, creatinine 2.94 (previously 1.74 on 8/9, baseline 1.3), LDH, ferritin 804, CRP 12.4, procalcitonin negative, lactic acid 1.3, WBC 4.7, Hb 12, platelets 100, D-dimer 2.4, fibrinogen 431, COVID-19 positive.  CXR with RML and RLL ill-defined opacity.  Assessment & Plan:   Principal Problem:   Acute hypoxemic respiratory failure due to COVID-19 [facility]) Active Problems:   Multiple sclerosis (HCC)   Hypothyroidism   Chronic diastolic (congestive) heart failure (HCC)   Essential hypertension   Cerebrovascular disease   Hypotension   Acute kidney injury superimposed on CKD ([place])  1. Acute hypoxic respiratory failure secondary to COVID-19  Possibly developing  COVID encephalopathy Renn Dirocco. Unvaccinated b. CXR with ill defined opacity R mid lung and R base, likely representing atypical organism pneumonia c. Follow repeat CXR 10/7 d. Currently requiring 3 L [place] (on 6 L yesterday) e. Procalcitonin negative - hold off antibiotics for now f. Remdesivir x 5 days, Steroids x 10 days, baricitinib (renal dosing) g. Strict I/O, daily weights h. Encourage Incentive Spirometry, Flutter Valve and Pronation as tolerated.  OOB.  Therapy.  COVID-19 Labs  Recent Labs    07/03/20 1401 07/04/20 0428  DDIMER 2.42* 1.95*  FERRITIN 804* 837*  LDH 411*  --   CRP 12.4* 12.7*    Lab Results  Component Value Date   SARSCOV2NAA Detected (Rockie Schnoor) 06/29/2020   [place] NEGATIVE 05/21/2020   [place] NEGATIVE 05/02/2020   [place] NEGATIVE 04/18/2020   2. Elevated D dimer 1. Likely 2/2 covid 19 infection - will follow LE [place] 2. Trend, consider additional imaging as needed  3. Multiple sclerosis with neurogenic bladder and suprapubic catheter  Benjy Kana. Not on any Biologics or immunomodulators b. Continue home pain regimen: Norco and Valium c. PT  3. Compensated HFpEF Yukari Flax. Holding home Coreg due to hypotension  4. AKI on CKD 3b Francelia Mclaren. Baseline creatinine 1.3-1.5, peaked at 2.94 b. Improving today, follow with IVF c. Follow UA d. Consider renal US if not improving to baseline  5. Hypertension now with hypotension Regina Coppolino. BP improved b. Hold home antihypertensives (coreg, amlodipine)  6. Hypothyroidism Mohsen Odenthal. Continue home Synthroid  7. History of stroke with left-sided weakness  8. Hypokalemia: replace and follow  9. Hypernatremia: follow with D5w  10. Hyperglycemia:  follow A1c, SSI  DVT prophylaxis: heparin Code Status: full  Family Communication: none at bedside Disposition:   Status is: Inpatient  Remains inpatient appropriate because:Inpatient level of care appropriate due to severity of illness   Dispo: The patient is from: Home               Anticipated d/c is to: pending              Anticipated d/c date is: > 3 days              Patient currently is not medically stable to d/c.       Consultants:   none  Procedures:   none  Antimicrobials:  Anti-infectives (From admission, onward)   Start     Dose/Rate Route Frequency Ordered Stop   07/04/20 1000  remdesivir 100 mg in sodium chloride 0.9 % 100 mL IVPB  Status:  Discontinued       "Followed by" Linked Group Details   100 mg 200 mL/hr over 30 Minutes Intravenous Every 24 hours 07/03/20 1559 07/03/20 1845   07/04/20 1000  remdesivir 100 mg in sodium chloride 0.9 % 100 mL IVPB  Status:  Discontinued       "Followed by" Linked Group Details   100 mg 200 mL/hr over 30 Minutes Intravenous Daily 07/03/20 1845 07/03/20 1905   07/04/20 1000  remdesivir 100 mg in sodium chloride 0.9 % 100 mL IVPB        100 mg 200 mL/hr over 30 Minutes Intravenous Every 24 hours 07/03/20 2008 07/08/20 0959   07/03/20 1845  remdesivir 200 mg in sodium chloride 0.9% 250 mL IVPB  Status:  Discontinued       "Followed by" Linked Group Details   200 mg 580 mL/hr over 30 Minutes Intravenous Once 07/03/20 1845 07/03/20 1905   07/03/20 1700  remdesivir 200 mg in sodium chloride 0.9% 250 mL IVPB       "Followed by" Linked Group Details   200 mg 580 mL/hr over 30 Minutes Intravenous Once 07/03/20 1559 07/03/20 1800      Subjective: No new complaints  Objective: Vitals:   07/03/20 1800 07/03/20 1949 07/03/20 2300 07/04/20 1003  BP: (!) 157/90   114/63  Pulse:    (!) 55  Resp:   17 16  Temp:    97.9 F (36.6 C)  TempSrc:    Oral  SpO2:    91%  Weight:  50.5 kg    Height:  5\' 3"  (1.6 m)      Intake/Output Summary (Last 24 hours) at 07/04/2020 1948 Last data filed at 07/04/2020 1828 Gross per 24 hour  Intake 1309.91 ml  Output 1200 ml  Net 109.91 ml   Filed Weights   07/03/20 1949  Weight: 50.5 kg    Examination:  General exam: Appears calm and comfortable  Respiratory  system: bibasilar crackles Cardiovascular system: S1 & S2 heard, RRR.  Gastrointestinal system: Abdomen is nondistended, soft and nontender.  Central nervous system: Alert and oriented. No focal neurological deficits. Extremities: no LEE Skin: No rashes, lesions or ulcers Psychiatry: Judgement and insight appear normal. Mood & affect appropriate.     Data Reviewed: I have personally reviewed following labs and imaging studies  CBC: Recent Labs  Lab 07/03/20 1401 07/04/20 0428  WBC 4.7 3.1*  NEUTROABS  --  2.1  HGB 12.1* 11.4*  HCT 37.4* 37.1*  MCV 95.7 99.2  PLT 100* 86*    Basic Metabolic Panel:  Recent Labs  Lab 07/03/20 1401 07/04/20 0428  NA 147* 148*  K 3.7 3.0*  CL 110 116*  CO2 25 24  GLUCOSE 111* 186*  BUN 55* 52*  CREATININE 2.94* 2.17*  CALCIUM 8.3* 8.2*    GFR: Estimated Creatinine Clearance: 20.4 mL/min (Adelaide Pfefferkorn) (by C-G formula based on SCr of 2.17 mg/dL (H)).  Liver Function Tests: Recent Labs  Lab 07/03/20 1401 07/04/20 0428  AST 69* 56*  ALT 23 20  ALKPHOS 55 52  BILITOT 1.2 0.6  PROT 7.3 6.8  ALBUMIN 3.2* 2.9*    CBG: Recent Labs  Lab 07/04/20 1114  GLUCAP 241*     Recent Results (from the past 240 hour(s))  Novel Coronavirus, NAA ([facility])     Status: Abnormal   Collection Time: 06/29/20  5:57 PM   Specimen: Nasopharyngeal(NP) swabs in vial transport medium   Nasopharynge  Screenin  Result Value Ref Range Status   SARS-CoV-2, NAA Detected (Lanecia Sliva) Not Detected Final    Comment: Patients who have Courage Biglow positive COVID-19 test result may now have treatment options. Treatment options are available for patients with mild to moderate symptoms and for hospitalized patients. Visit our website at [email or web address] for resources and information. This nucleic acid amplification test was developed and its performance characteristics determined by [employer]. Nucleic acid amplification tests include RT-PCR and TMA. This test  has not been FDA cleared or approved. This test has been authorized by FDA under an Emergency Use Authorization (EUA). This test is only authorized for the duration of time the declaration that circumstances exist justifying the authorization of the emergency use of in vitro diagnostic tests for detection of SARS-CoV-2 virus and/or diagnosis of COVID-19 infection under section 564(b)(1) of the Act, 21 U.S.C. 604VWU-9(W) (1), unless the authorization is terminated or revoked sooner. When diagnostic testing is negativ e, the possibility of Shiva Sahagian false negative result should be considered in the context of Yalissa Fink patient's recent exposures and the presence of clinical signs and symptoms consistent with COVID-19. An individual without symptoms of COVID-19 and who is not shedding SARS-CoV-2 virus would expect to have Afsheen Antony negative (not detected) result in this assay.   SARS-COV-2, NAA 2 DAY TAT     Status: None   Collection Time: 06/29/20  5:57 PM   Nasopharynge  Screenin  Result Value Ref Range Status   SARS-CoV-2, NAA 2 DAY TAT Performed  Final  Blood Culture (routine x 2)     Status: None (Preliminary result)   Collection Time: 07/03/20  2:01 PM   Specimen: BLOOD  Result Value Ref Range Status   Specimen Description   Final    BLOOD LEFT ANTECUBITAL Performed at [facility] [address] [place], [place] [address]    Special Requests   Final    BOTTLES DRAWN AEROBIC AND ANAEROBIC Blood Culture results may not be optimal due to an inadequate volume of blood received in culture bottles Performed at [facility] [address] [place], [place] [address]    Culture   Final    NO GROWTH < 24 HOURS Performed at [facility] [address] [place], [place] [address]    Report Status PENDING  Incomplete         Radiology Studies: DG Chest Port 1 View  Result Date: 07/03/2020 CLINICAL DATA:  Shortness of breath EXAM: PORTABLE CHEST 1 VIEW  COMPARISON:  May 02, 2020 FINDINGS: There is patchy ill-defined opacity in the right mid lung  and right base regions. Lungs elsewhere are clear. Heart size and pulmonary vascularity are normal. No adenopathy. No bone lesions. IMPRESSION: Ill-defined opacity right mid lung and right base regions, likely representing atypical organism pneumonia. Lungs elsewhere clear. Heart size normal. No adenopathy evident. Electronically Signed   By: Lowella Grip III M.D.   On: 07/03/2020 12:43        Scheduled Meds: . baricitinib  1 mg Oral Daily  . heparin  5,000 Units Subcutaneous Q8H  . HYDROcodone-acetaminophen  1 tablet Oral BID  . insulin aspart  0-5 Units Subcutaneous QHS  . insulin aspart  0-9 Units Subcutaneous TID WC  . Ipratropium-Albuterol  1 puff Inhalation Q6H  . levothyroxine  75 mcg Oral QAC breakfast  . methylPREDNISolone (SOLU-MEDROL) injection  0.5 mg/kg Intravenous Q12H   Followed by  . [START ON 07/07/2020] predniSONE  50 mg Oral Daily  . sodium chloride flush  3 mL Intravenous Q12H   Continuous Infusions: . dextrose 50 mL/hr at 07/04/20 0846  . remdesivir 100 mg in NS 100 mL 100 mg (07/04/20 0923)     LOS: 1 day    Time spent: over 85 min    Fayrene Helper, MD [facility]   To contact the attending provider between 7A-7P or the covering provider during after hours 7P-7A, please log into the web site [email or web address] and access using universal [place] password for that web site. If you do not have the password, please call the hospital operator.  07/04/2020, 7:48 PM

## 2020-07-04 NOTE — Progress Notes (Signed)
Pt MEWS scored 2. Pt with no symptoms. Resting with eyes closed no distressed. Will continue to monitor

## 2020-07-05 ENCOUNTER — Inpatient Hospital Stay (HOSPITAL_COMMUNITY): Payer: Medicare Other

## 2020-07-05 DIAGNOSIS — U071 COVID-19: Secondary | ICD-10-CM

## 2020-07-05 DIAGNOSIS — R7989 Other specified abnormal findings of blood chemistry: Secondary | ICD-10-CM | POA: Diagnosis not present

## 2020-07-05 DIAGNOSIS — J9601 Acute respiratory failure with hypoxia: Secondary | ICD-10-CM | POA: Diagnosis not present

## 2020-07-05 LAB — CBC WITH DIFFERENTIAL/PLATELET
Abs Immature Granulocytes: 0.06 10*3/uL (ref 0.00–0.07)
Basophils Absolute: 0 10*3/uL (ref 0.0–0.1)
Basophils Relative: 0 %
Eosinophils Absolute: 0 10*3/uL (ref 0.0–0.5)
Eosinophils Relative: 0 %
HCT: 36.8 % — ABNORMAL LOW (ref 39.0–52.0)
Hemoglobin: 11.1 g/dL — ABNORMAL LOW (ref 13.0–17.0)
Immature Granulocytes: 1 %
Lymphocytes Relative: 16 %
Lymphs Abs: 1.3 10*3/uL (ref 0.7–4.0)
MCH: 30.4 pg (ref 26.0–34.0)
MCHC: 30.2 g/dL (ref 30.0–36.0)
MCV: 100.8 fL — ABNORMAL HIGH (ref 80.0–100.0)
Monocytes Absolute: 0.3 10*3/uL (ref 0.1–1.0)
Monocytes Relative: 4 %
Neutro Abs: 6.1 10*3/uL (ref 1.7–7.7)
Neutrophils Relative %: 79 %
Platelets: 111 10*3/uL — ABNORMAL LOW (ref 150–400)
RBC: 3.65 MIL/uL — ABNORMAL LOW (ref 4.22–5.81)
RDW: 14.4 % (ref 11.5–15.5)
WBC: 7.7 10*3/uL (ref 4.0–10.5)
nRBC: 0 % (ref 0.0–0.2)

## 2020-07-05 LAB — GLUCOSE, CAPILLARY
Glucose-Capillary: 157 mg/dL — ABNORMAL HIGH (ref 70–99)
Glucose-Capillary: 389 mg/dL — ABNORMAL HIGH (ref 70–99)
Glucose-Capillary: 146 mg/dL — ABNORMAL HIGH (ref 70–99)
Glucose-Capillary: 267 mg/dL — ABNORMAL HIGH (ref 70–99)

## 2020-07-05 LAB — COMPREHENSIVE METABOLIC PANEL
ALT: 18 U/L (ref 0–44)
AST: 44 U/L — ABNORMAL HIGH (ref 15–41)
Albumin: 2.8 g/dL — ABNORMAL LOW (ref 3.5–5.0)
Alkaline Phosphatase: 51 U/L (ref 38–126)
Anion gap: 10 (ref 5–15)
BUN: 60 mg/dL — ABNORMAL HIGH (ref 8–23)
CO2: 21 mmol/L — ABNORMAL LOW (ref 22–32)
Calcium: 8.4 mg/dL — ABNORMAL LOW (ref 8.9–10.3)
Chloride: 121 mmol/L — ABNORMAL HIGH (ref 98–111)
Creatinine, Ser: 2.02 mg/dL — ABNORMAL HIGH (ref 0.61–1.24)
GFR calc non Af Amer: 31 mL/min — ABNORMAL LOW (ref >60)
Glucose, Bld: 134 mg/dL — ABNORMAL HIGH (ref 70–99)
Potassium: 3.8 mmol/L (ref 3.5–5.1)
Sodium: 152 mmol/L — ABNORMAL HIGH (ref 135–145)
Total Bilirubin: 0.4 mg/dL (ref 0.3–1.2)
Total Protein: 6.3 g/dL — ABNORMAL LOW (ref 6.5–8.1)

## 2020-07-05 LAB — BASIC METABOLIC PANEL
Anion gap: 11 (ref 5–15)
BUN: 65 mg/dL — ABNORMAL HIGH (ref 8–23)
CO2: 23 mmol/L (ref 22–32)
Calcium: 8.8 mg/dL — ABNORMAL LOW (ref 8.9–10.3)
Chloride: 119 mmol/L — ABNORMAL HIGH (ref 98–111)
Creatinine, Ser: 1.91 mg/dL — ABNORMAL HIGH (ref 0.61–1.24)
GFR calc non Af Amer: 33 mL/min — ABNORMAL LOW (ref >60)
Glucose, Bld: 191 mg/dL — ABNORMAL HIGH (ref 70–99)
Potassium: 3.8 mmol/L (ref 3.5–5.1)
Sodium: 153 mmol/L — ABNORMAL HIGH (ref 135–145)

## 2020-07-05 LAB — FERRITIN: Ferritin: 865 ng/mL — ABNORMAL HIGH (ref 24–336)

## 2020-07-05 LAB — D-DIMER, QUANTITATIVE: D-Dimer, Quant: 1.48 ug/mL-FEU — ABNORMAL HIGH (ref 0.00–0.50)

## 2020-07-05 LAB — C-REACTIVE PROTEIN: CRP: 7.1 mg/dL — ABNORMAL HIGH (ref <1.0)

## 2020-07-05 MED ORDER — RESOURCE THICKENUP CLEAR PO POWD
ORAL | Status: DC | PRN
  Filled 2020-07-05: qty 125

## 2020-07-05 MED ORDER — DEXTROSE 5 % IV SOLN: INTRAVENOUS | Status: AC

## 2020-07-05 MED ORDER — BARICITINIB 2 MG PO TABS
2.0000 mg | ORAL_TABLET | Freq: Every day | ORAL | Status: DC
  Administered 2020-07-05 – 2020-07-12 (×8): 2 mg via ORAL
  Filled 2020-07-05 (×8): qty 1

## 2020-07-05 MED ORDER — ENSURE ENLIVE PO LIQD
237.0000 mL | Freq: Three times a day (TID) | ORAL | Status: DC
  Administered 2020-07-05 – 2020-07-13 (×25): 237 mL via ORAL

## 2020-07-05 MED ORDER — ASCORBIC ACID 500 MG PO TABS
500.0000 mg | ORAL_TABLET | Freq: Two times a day (BID) | ORAL | Status: DC
  Administered 2020-07-05 – 2020-07-13 (×17): 500 mg via ORAL
  Filled 2020-07-05 (×17): qty 1

## 2020-07-05 MED ORDER — ADULT MULTIVITAMIN W/MINERALS CH
1.0000 | ORAL_TABLET | Freq: Every day | ORAL | Status: DC
  Administered 2020-07-05 – 2020-07-13 (×9): 1 via ORAL
  Filled 2020-07-05 (×9): qty 1

## 2020-07-05 NOTE — Progress Notes (Signed)
Occupational Therapy Evaluation  Patient with functional deficits listed below impacting safety and independence with self care. Patient on 3L in bed saturating 97-98%. Post transfer to recliner patient desaturate to mid 33s, cues for PLB and recover to 89-90% on 3L ~30 seconds. Educate patient in IS technique, patient perform x10. Patient min A with functional transfer due to decreased stability and safety. Unsure of patient's baseline cognition or if requires more assist with ADLs than patient reports (hx of CVA), patient currently disoriented to time, name of hospital and with moderate word finding difficulty. Recommend continued acute OT services in order to facilitate D/C to venue listed below.    07/05/20 0914  OT Visit Information  Last OT Received On 07/05/20  Assistance Needed +1  History of Present Illness 79 yo male admitted with COVID. Hx of MS, HF, CKD, CVA with L sided weakness, CAD, gait d/o, PVD, neurogenic bladder  Precautions  Precautions Fall  Precaution Comments monitor O2  Restrictions  Weight Bearing Restrictions No  Home Living  Family/patient expects to be discharged to: Private residence  Living Arrangements Spouse/significant other;Children (daughter)  Available Help at Discharge Family;Available 24 hours/day  Type of Home House  Home Access Stairs to enter  Entrance Stairs-Number of Steps 2  Entrance Stairs-Rails Right  Home Layout One level  Bathroom Shower/Tub Tub/shower unit (sponge bath at sink)  Tax adviser - 2 wheels;Cane - single point;Grab bars - toilet;Crutches;Adaptive equipment  Adaptive Equipment Reacher;Sock aid;Long-handled shoe horn;Long-handled sponge  Prior Function  Level of Independence Needs assistance  Gait / Transfers Assistance Needed uses RW  ADL's / Homemaking Assistance Needed patient reports independent with self care "until I got sick"  Comments household independent amb per pt. amb with RW  when going out.  Wife performs majority of housework and provides transportation as pt no longer has his license.  Communication  Communication Expressive difficulties;Other (comment) (garbled speech/word finding difficulty at times)  Pain Assessment  Pain Assessment No/denies pain  Cognition  Arousal/Alertness Awake/alert  Behavior During Therapy Flat affect  Overall Cognitive Status No family/caregiver present to determine baseline cognitive functioning  Area of Impairment Orientation;Problem solving  Orientation Level Disoriented to;Time;Place  Problem Solving Slow processing;Requires verbal cues  General Comments pt states september, keeps stating "20.." for year and states [facility] for hospital. with hx of CVA cog may be at baseline  Upper Extremity Assessment  Upper Extremity Assessment Generalized weakness  Lower Extremity Assessment  Lower Extremity Assessment Defer to PT evaluation  Cervical / Trunk Assessment  Cervical / Trunk Assessment Kyphotic  ADL  Overall ADL's  Needs assistance/impaired  Grooming Set up;Sitting  Upper Body Bathing Set up;Sitting  Lower Body Bathing Minimal assistance;Sit to/from stand  Upper Body Dressing  Set up;Sitting  Lower Body Dressing Minimal assistance;Sitting/lateral leans;Sit to/from stand  Lower Body Dressing Details (indicate cue type and reason) seated in recliner patient able to doff/don sock with increased time, min A in standing due to decreased balance  Toilet Transfer Minimal assistance;Stand-pivot  Toilet Transfer Details (indicate cue type and reason) to recliner, cues for safety with hand placement and min A for stability. cue patient OT can grab walker however patient begins standing from EOB, recommend use of walker for further transfers  Toileting- Clothing Manipulation and Hygiene Minimal assistance;Sit to/from stand  Functional mobility during ADLs Minimal assistance;Cueing for safety  General ADL Comments patient  requiring increased assistance with self care due to decreased activity tolerance, safety awareness, balance.  educate patient in pursed lip breathing strategies as well is IS use   Bed Mobility  Overal bed mobility Needs Assistance  Bed Mobility Supine to Sit  Supine to sit Supervision;HOB elevated  General bed mobility comments patient require increased time  Transfers  Overall transfer level Needs assistance  Equipment used None  Transfers Sit to/from Stand;Stand Pivot Transfers  Sit to Stand Min assist  Stand pivot transfers Min assist  General transfer comment patient require assist to power up to standing and for stability to pivot to recliner  Balance  Overall balance assessment Needs assistance  Sitting-balance support Feet supported  Sitting balance-Leahy Scale Fair  Standing balance support Single extremity supported  Standing balance-Leahy Scale Poor  General Comments  General comments (skin integrity, edema, etc.) patient on 3L at rest saturation 97-98%, post transfer patient mid 17s with cues for PLB. recover to 89-90% ~30 seconds  Exercises  Exercises Other exercises  Other Exercises  Other Exercises IS x10  OT - End of Session  Equipment Utilized During Treatment Oxygen  Activity Tolerance Patient tolerated treatment well  Patient left in chair;with call bell/phone within reach;with chair alarm set  Nurse Communication Mobility status  OT Assessment  OT Recommendation/Assessment Patient needs continued OT Services  OT Visit Diagnosis Unsteadiness on feet (R26.81);Other abnormalities of gait and mobility (R26.89)  OT Problem List Decreased strength;Decreased activity tolerance;Impaired balance (sitting and/or standing);Decreased safety awareness;Decreased knowledge of precautions;Cardiopulmonary status limiting activity  OT Plan  OT Frequency (ACUTE ONLY) Min 2X/week  OT Treatment/Interventions (ACUTE ONLY) Self-care/ADL training;Therapeutic exercise;Energy  conservation;DME and/or AE instruction;Therapeutic activities;Patient/family education;Balance training  AM-PAC OT "6 Clicks" Daily Activity Outcome Measure (Version 2)  Help from another person eating meals? 4  Help from another person taking care of personal grooming? 3  Help from another person toileting, which includes using toliet, bedpan, or urinal? 3  Help from another person bathing (including washing, rinsing, drying)? 3  Help from another person to put on and taking off regular upper body clothing? 3  Help from another person to put on and taking off regular lower body clothing? 3  6 Click Score 19  OT Recommendation  Follow Up Recommendations Home health OT;Supervision/Assistance - 24 hour  OT Equipment None recommended by OT  Individuals Consulted  Consulted and Agree with Results and Recommendations Patient  Acute Rehab OT Goals  Patient Stated Goal home  OT Goal Formulation With patient  Time For Goal Achievement 07/19/20  Potential to Achieve Goals Good  OT Time Calculation  OT Start Time (ACUTE ONLY) 0748  OT Stop Time (ACUTE ONLY) 0813  OT Time Calculation (min) 25 min  OT General Charges  $OT Visit 1 Visit  OT Evaluation  $OT Eval Low Complexity 1 Low  OT Treatments  $Self Care/Home Management  8-22 mins  Written Expression  Dominant Hand Left   Delbert Phenix OT OT pager: [phone]

## 2020-07-05 NOTE — Progress Notes (Addendum)
PROGRESS NOTE    Jordan Ward  DJM:[id number] DOB: Mar 08, 1941 DOA: 07/03/2020 PCP: Noreene Larsson, NP   Chief Complaint  Patient presents with  . Weakness    Brief Narrative:  Per previous HPI Patient is Jordan Ward poor historian and much of the history is obtained from prior notes  This is Jordan Ward 79 year old male who has not been vaccinated against COVID-19 with Jordan Ward complex medical history including MS with suprapubic catheter and recurrent Proteus UTIs, HFpEF, recent left ureteroscopy with stone extraction and stent placement on 8/24, CKD 3b, vascular disease, hypertension, stroke with left-sided weakness who presented today to the ED from the monoclonal antibody infusion center with acute hypoxic respiratory failure.  He was diagnosed with COVID-19 on October 1 but is unsure of when his symptoms first started but started with Jordan Ward mild cough.  Wife is Jordan Ward positive sick contact.  He was referred to the infusion center and was noted to be desaturating to the 70s on room air and was transferred to the ED initially requiring 7 LPM to maintain sats greater than 90%.  No fever, chills, nausea, vomiting, diarrhea, rash.  ED Course: Afebrile, bradycardic (58), hypotensive (124/75 -> 89/61 ->95/53) requiring 6 LPM.  Notable labs: Sodium 147, glucose 111, BUN 55, creatinine 2.94 (previously 1.74 on 8/9, baseline 1.3), LDH, ferritin 804, CRP 12.4, procalcitonin negative, lactic acid 1.3, WBC 4.7, Hb 12, platelets 100, D-dimer 2.4, fibrinogen 431, COVID-19 positive.  CXR with RML and RLL ill-defined opacity.  Assessment & Plan:   Principal Problem:   Acute hypoxemic respiratory failure due to COVID-19 Jordan Ward) Active Problems:   Multiple sclerosis (HCC)   Hypothyroidism   Chronic diastolic (congestive) heart failure (HCC)   Essential hypertension   Cerebrovascular disease   Hypotension   Acute kidney injury superimposed on CKD ([place])  1. Acute hypoxic respiratory failure secondary to COVID-19  Possibly developing  COVID encephalopathy Jordan Ward. Unvaccinated b. CXR with ill defined opacity R mid lung and R base, likely representing atypical organism pneumonia c. Follow repeat CXR 10/7 with mild infiltrate R upper lung and R mid lung, chronic interstitial lung disease d. Currently requiring 3 L [place] (on 6 L at presentation) - wean as tolerated e. Procalcitonin negative - hold off antibiotics for now f. Remdesivir x 5 days, Steroids x 10 days, baricitinib (renal dosing) g. Strict I/O, daily weights h. Encourage Incentive Spirometry, Flutter Valve and Pronation as tolerated.  OOB.  Therapy.  COVID-19 Labs  Recent Labs    07/03/20 1401 07/04/20 0428 07/05/20 0408  DDIMER 2.42* 1.95* 1.48*  FERRITIN 804* 837* 865*  LDH 411*  --   --   CRP 12.4* 12.7* 7.1*    Lab Results  Component Value Date   SARSCOV2NAA Detected (Anatasia Tino) 06/29/2020   [place] NEGATIVE 05/21/2020   [place] NEGATIVE 05/02/2020   [place] NEGATIVE 04/18/2020   2. Elevated D dimer 1. Likely 2/2 covid 19 infection - will follow LE [place] 2. Trend, consider additional imaging as needed  3. Multiple sclerosis with neurogenic bladder and suprapubic catheter  Jordan Ward. Not on any Biologics or immunomodulators b. Continue home pain regimen: Norco and Valium c. PT  3. Compensated HFpEF Jordan Ward. Holding home Coreg due to hypotension  4. AKI on CKD 3b Jordan Ward. Baseline creatinine 1.3-1.5, peaked at 2.94 b. Improving today, follow with IVF c. Follow UA -> appears c/w UTI (see below) d. Follow renal US -> left sided hydro, discussed with urology who will eval  5. Asymptomatic Bacteruria  Suprapubic Catheter  1. UA with +nitrite and LE, hematuria  2. He's asymptomatic from this, will follow urine cx  6. Hypertension now with hypotension Jordan Ward. BP improved b. Hold home antihypertensives (coreg, amlodipine)  6. Hypothyroidism Jordan Ward. Continue home Synthroid  7. History of stroke with left-sided weakness  8. Hypokalemia: replace and  follow  9. Hypernatremia: continue D5w  10. Prediabetes  Hyperglycemia 2/2 Steroids: follow A1c (6.3), SSI  11. Thrombocytopenia: mild, some degree of chronicity  DVT prophylaxis: heparin Code Status: full  Family Communication: none at bedside - 10/7 wife Disposition:   Status is: Inpatient  Remains inpatient appropriate because:Inpatient level of care appropriate due to severity of illness   Dispo: The patient is from: Home              Anticipated d/c is to: pending              Anticipated d/c date is: > 3 days              Patient currently is not medically stable to d/c.       Consultants:   none  Procedures:   none  Antimicrobials:  Anti-infectives (From admission, onward)   Start     Dose/Rate Route Frequency Ordered Stop   07/04/20 1000  remdesivir 100 mg in sodium chloride 0.9 % 100 mL IVPB  Status:  Discontinued       "Followed by" Linked Group Details   100 mg 200 mL/hr over 30 Minutes Intravenous Every 24 hours 07/03/20 1559 07/03/20 1845   07/04/20 1000  remdesivir 100 mg in sodium chloride 0.9 % 100 mL IVPB  Status:  Discontinued       "Followed by" Linked Group Details   100 mg 200 mL/hr over 30 Minutes Intravenous Daily 07/03/20 1845 07/03/20 1905   07/04/20 1000  remdesivir 100 mg in sodium chloride 0.9 % 100 mL IVPB        100 mg 200 mL/hr over 30 Minutes Intravenous Every 24 hours 07/03/20 2008 07/08/20 0959   07/03/20 1845  remdesivir 200 mg in sodium chloride 0.9% 250 mL IVPB  Status:  Discontinued       "Followed by" Linked Group Details   200 mg 580 mL/hr over 30 Minutes Intravenous Once 07/03/20 1845 07/03/20 1905   07/03/20 1700  remdesivir 200 mg in sodium chloride 0.9% 250 mL IVPB       "Followed by" Linked Group Details   200 mg 580 mL/hr over 30 Minutes Intravenous Once 07/03/20 1559 07/03/20 1800      Subjective: No new complaints today Notes persistent SOB  Objective: Vitals:   07/03/20 2300 07/04/20 1003 07/05/20 0432  07/05/20 0500  BP:  114/63 131/82   Pulse:  (!) 55 (!) 52   Resp: 17 16 16    Temp:  97.9 F (36.6 C) 97.6 F (36.4 C)   TempSrc:  Oral    SpO2:  91% 94%   Weight:    52.7 kg  Height:        Intake/Output Summary (Last 24 hours) at 07/05/2020 0905 Last data filed at 07/05/2020 0436 Gross per 24 hour  Intake 1403.18 ml  Output 1400 ml  Net 3.18 ml   Filed Weights   07/03/20 1949 07/05/20 0500  Weight: 50.5 kg 52.7 kg    Examination:  General: No acute distress. Cardiovascular: Heart sounds show Abi Shoults regular rate, and rhythm.  Lungs: bibasilar crackles Abdomen: Soft, nontender, nondistended. Suprapubic catheter. Neurological: Alert and oriented 3. Moves  all extremities 4 . Cranial nerves II through XII grossly intact. Skin: Warm and dry. No rashes or lesions. Extremities: No clubbing or cyanosis. No edema.    Data Reviewed: I have personally reviewed following labs and imaging studies  CBC: Recent Labs  Lab 07/03/20 1401 07/04/20 0428 07/05/20 0408  WBC 4.7 3.1* 7.7  NEUTROABS  --  2.1 6.1  HGB 12.1* 11.4* 11.1*  HCT 37.4* 37.1* 36.8*  MCV 95.7 99.2 100.8*  PLT 100* 86* 111*    Basic Metabolic Panel: Recent Labs  Lab 07/03/20 1401 07/04/20 0428 07/05/20 0408  NA 147* 148* 152*  K 3.7 3.0* 3.8  CL 110 116* 121*  CO2 25 24 21*  GLUCOSE 111* 186* 134*  BUN 55* 52* 60*  CREATININE 2.94* 2.17* 2.02*  CALCIUM 8.3* 8.2* 8.4*    GFR: Estimated Creatinine Clearance: 22.8 mL/min (Jordan Ward) (by C-G formula based on SCr of 2.02 mg/dL (H)).  Liver Function Tests: Recent Labs  Lab 07/03/20 1401 07/04/20 0428 07/05/20 0408  AST 69* 56* 44*  ALT 23 20 18   ALKPHOS 55 52 51  BILITOT 1.2 0.6 0.4  PROT 7.3 6.8 6.3*  ALBUMIN 3.2* 2.9* 2.8*    CBG: Recent Labs  Lab 07/04/20 1114  GLUCAP 241*     Recent Results (from the past 240 hour(s))  Novel Coronavirus, NAA ([facility])     Status: Abnormal   Collection Time: 06/29/20  5:57 PM   Specimen:  Nasopharyngeal(NP) swabs in vial transport medium   Nasopharynge  Screenin  Result Value Ref Range Status   SARS-CoV-2, NAA Detected (Jordan Ward) Not Detected Final    Comment: Patients who have Jordan Ward positive COVID-19 test result may now have treatment options. Treatment options are available for patients with mild to moderate symptoms and for hospitalized patients. Visit our website at [email or web address] for resources and information. This nucleic acid amplification test was developed and its performance characteristics determined by [employer]. Nucleic acid amplification tests include RT-PCR and TMA. This test has not been FDA cleared or approved. This test has been authorized by FDA under an Emergency Use Authorization (EUA). This test is only authorized for the duration of time the declaration that circumstances exist justifying the authorization of the emergency use of in vitro diagnostic tests for detection of SARS-CoV-2 virus and/or diagnosis of COVID-19 infection under section 564(b)(1) of the Act, 21 U.S.C. 299MEQ-6(S) (1), unless the authorization is terminated or revoked sooner. When diagnostic testing is negativ e, the possibility of Jheremy Boger false negative result should be considered in the context of Chandani Rogowski patient's recent exposures and the presence of clinical signs and symptoms consistent with COVID-19. An individual without symptoms of COVID-19 and who is not shedding SARS-CoV-2 virus would expect to have Caelyn Route negative (not detected) result in this assay.   SARS-COV-2, NAA 2 DAY TAT     Status: None   Collection Time: 06/29/20  5:57 PM   Nasopharynge  Screenin  Result Value Ref Range Status   SARS-CoV-2, NAA 2 DAY TAT Performed  Final  Blood Culture (routine x 2)     Status: None (Preliminary result)   Collection Time: 07/03/20  2:01 PM   Specimen: BLOOD  Result Value Ref Range Status   Specimen Description   Final    BLOOD LEFT ANTECUBITAL Performed at [facility] [address], [place], [place] [address]    Special Requests   Final    BOTTLES DRAWN AEROBIC AND ANAEROBIC Blood Culture results may not  be optimal due to an inadequate volume of blood received in culture bottles Performed at [facility] [address] [place], [place] [address]    Culture   Final    NO GROWTH < 24 HOURS Performed at [facility] [address], [place], [place] [address]    Report Status PENDING  Incomplete         Radiology Studies: DG CHEST PORT 1 VIEW  Result Date: 07/05/2020 CLINICAL DATA:  Shortness of breath.  COVID. EXAM: PORTABLE CHEST 1 VIEW COMPARISON:  07/03/2020. 07/11/2017. No pleural effusion or pneumothorax. FINDINGS: Mediastinum hilar structures normal. Mild infiltrate is noted in the right upper lung and right mid lung. Chronic interstitial changes again noted. Heart size normal. No acute bony abnormality. IMPRESSION: Mild infiltrate right upper lung and right mid lung. Chronic interstitial lung disease. Electronically Signed   By: Marcello Moores  Register   On: 07/05/2020 06:30   DG Chest Port 1 View  Result Date: 07/03/2020 CLINICAL DATA:  Shortness of breath EXAM: PORTABLE CHEST 1 VIEW COMPARISON:  May 02, 2020 FINDINGS: There is patchy ill-defined opacity in the right mid lung and right base regions. Lungs elsewhere are clear. Heart size and pulmonary vascularity are normal. No adenopathy. No bone lesions. IMPRESSION: Ill-defined opacity right mid lung and right base regions, likely representing atypical organism pneumonia. Lungs elsewhere clear. Heart size normal. No adenopathy evident. Electronically Signed   By: Lowella Grip III M.D.   On: 07/03/2020 12:43        Scheduled Meds: . baricitinib  1 mg Oral Daily  . heparin  5,000 Units Subcutaneous Q8H  . HYDROcodone-acetaminophen  1 tablet Oral BID  . insulin aspart  0-5 Units Subcutaneous QHS  . insulin aspart  0-9 Units Subcutaneous TID  WC  . Ipratropium-Albuterol  1 puff Inhalation Q6H  . levothyroxine  75 mcg Oral QAC breakfast  . methylPREDNISolone (SOLU-MEDROL) injection  0.5 mg/kg Intravenous Q12H   Followed by  . [START ON 07/07/2020] predniSONE  50 mg Oral Daily  . sodium chloride flush  3 mL Intravenous Q12H   Continuous Infusions: . dextrose 50 mL/hr at 07/05/20 0827  . remdesivir 100 mg in NS 100 mL 100 mg (07/04/20 0923)     LOS: 2 days    Time spent: over 48 min    Fayrene Helper, MD [facility]   To contact the attending provider between 7A-7P or the covering provider during after hours 7P-7A, please log into the web site [email or web address] and access using universal [place] password for that web site. If you do not have the password, please call the hospital operator.  07/05/2020, 9:05 AM

## 2020-07-05 NOTE — Progress Notes (Signed)
VASCULAR LAB    Bilateral lower extremity venous duplex has been performed.  See CV proc for preliminary results.   Nixie Laube, RVT 07/05/2020, 12:32 PM

## 2020-07-05 NOTE — Progress Notes (Signed)
Initial Nutrition Assessment  DOCUMENTATION CODES:   Not applicable  INTERVENTION:  Ensure Enlive po BID, each supplement provides 350 kcal and 20 grams of protein (vanilla)  MVI with minerals daily  Vitamin C, 500 mg po BID to promote wound healing  Encouraged po intake of meals and supplements  NUTRITION DIAGNOSIS:   Increased nutrient needs related to catabolic illness, wound healing (acute hypoxemic respiratory failure due to COVID-19 virus; stage I pressure injury poa) as evidenced by estimated needs.    GOAL:   Patient will meet greater than or equal to 90% of their needs    MONITOR:   PO intake, Supplement acceptance, Weight trends, Labs, I & O's, Skin  REASON FOR ASSESSMENT:   Malnutrition Screening Tool    ASSESSMENT:  RD working remotely.  79 year old male with history of MS with suprapubic catheter and recurrent Proteus UTIs, HFpEF, recent left ureteroscopy with stone extraction s/p stent placement on 05/22/20, CKD stage IIIb, vascular disease, HTN, and stroke with residual left-sided weakness. Pt tested positive for COVID-19 on 10/01 presented from monoclonal antibody infusion center with acute hypoxic respiratory failure.  Able to speak with pt via phone this morning. He recalls eating some of his pancakes for breakfast this morning. Pt states his appetite has not been too good lately. Reports usually eating 3 meals/day at home and drinking vanilla Ensure, but states he has not been able to afford them recently. Per flowsheets, he has been consuming 45-75% of meals (63% average x 4 documented intakes 10/06-10/07). Patient with increased needs related to Covid-19 virus as well as stage I to sacrum poa. He is at high risk for malnutrition given poor po intake. Will order Ensure TID to aid with meeting needs as well as Vit C to promote wound healing. RD educated on the importance of nutrition and encouraged po intake of meals and supplements.   Weight history  reviewed, stable over the past 10 months.  I/Os: +100.2 ml since admit UOP: 1400 ml x 24 hrs Medications reviewed and include: Norco, SSI, Methylprednisolone, Remdesivir  IVF: D5 @ 50 ml/hr (204 kcal)  Labs: Na 152 (H), BUN 60 (H), Cr 2.02 (H)  NUTRITION - FOCUSED PHYSICAL EXAM: Unable to complete at this time, RD working remotely.   Diet Order:   Diet Order            Diet regular Room service appropriate? Yes; Fluid consistency: Thin  Diet effective now                 EDUCATION NEEDS:   Education needs have been addressed  Skin:  Skin Assessment: Skin Integrity Issues: Skin Integrity Issues:: Stage I Stage I: sacrum  Last BM:  10/05  Height:   Ht Readings from Last 1 Encounters:  07/03/20 5\' 3"  (1.6 m)    Weight:   Wt Readings from Last 1 Encounters:  07/05/20 52.7 kg    BMI:  Body mass index is 20.58 kg/m.  Estimated Nutritional Needs:   Kcal:  1443-1540  Protein:  80-90  Fluid:  > 1.3 L/day   Lajuan Lines, RD, LDN Clinical Nutrition After Hours/Weekend Pager # in [place]

## 2020-07-05 NOTE — Progress Notes (Signed)
Inpatient Diabetes Program Recommendations  AACE/ADA: New Consensus Statement on Inpatient Glycemic Control (2015)  Target Ranges:  Prepandial:   less than 140 mg/dL      Peak postprandial:   less than 180 mg/dL (1-2 hours)      Critically ill patients:  140 - 180 mg/dL   Lab Results  Component Value Date   GLUCAP 267 (H) 07/05/2020   HGBA1C 6.3 (H) 07/04/2020    Review of Glycemic Control  Diabetes history: none A1c 6.3%  Current orders for Inpatient glycemic control:  Novolog 0-9 units tid + hs  Solumedrol 25.2 mg q12 hours PO prednisone 50 mg Daily scheduled to start 10/9 Ensure Enlive tid between meals  Inpatient Diabetes Program Recommendations:    -  Add Tradjenta 5 mg Daily -  Consider Novolog 2 units tid meal coverage with parameters (glucose at least 80 mg/dl, pt eats at least 50% of meals)  Thanks,  Tama Headings RN, MSN, BC-ADM Inpatient Diabetes Coordinator Team Pager [phone] (8a-5p)

## 2020-07-05 NOTE — TOC Initial Note (Addendum)
Transition of Care [facility]) - Initial/Assessment Note    Patient Details  Name: Jordan Ward MRN: [id number] Date of Birth: Nov 17, 1940  [facility]) CM/SW Contact:    Trish Mage, LCSW Phone Number: 07/05/2020, 11:02 AM  Clinical Narrative:   Patient seen in follow up to PT, OT recommendation of [facility], OT. I found it difficult to understand patient, with his permission called the home number and spoke with daughter, who stays there to help care for both her parents.  Ivin Booty states they are interested in PT, OT services, and neither she nor her mother drives, so will need help with transportation at d/c.  TOC will continue to follow during the course of hospitalization.  Spoke with Di Kindle at [employer].  Arranged ride for tomorrow at [place] with Cindie with [employer] who will provide [facility] services for patient.              Expected Discharge Plan: [place] Barriers to Discharge: No Barriers Identified   Patient Goals and CMS Choice     Choice offered to / list presented to : Adult Children  Expected Discharge Plan and Services Expected Discharge Plan: [place]   Discharge Planning Services: CM Consult Post Acute Care Choice: Home Health                                        Prior Living Arrangements/Services   Lives with:: Spouse, Adult Children Patient language and need for interpreter reviewed:: Yes Do you feel safe going back to the place where you live?: Yes      Need for Family Participation in Patient Care: Yes (Comment) Care giver support system in place?: Yes (comment) Current home services: DME Criminal Activity/Legal Involvement Pertinent to Current Situation/Hospitalization: No - Comment as needed  Activities of Daily Living Home Assistive Devices/Equipment: Walker (specify type), Eyeglasses ADL Screening (condition at time of admission) Patient's cognitive ability adequate to safely complete  daily activities?: Yes Is the patient deaf or have difficulty hearing?: Yes Does the patient have difficulty seeing, even when wearing glasses/contacts?: No Does the patient have difficulty concentrating, remembering, or making decisions?: No Patient able to express need for assistance with ADLs?: Yes Does the patient have difficulty dressing or bathing?: Yes Independently performs ADLs?: No Communication: Independent Dressing (OT): Needs assistance Is this a change from baseline?: Change from baseline, expected to last >3 days Grooming: Independent Feeding: Independent Bathing: Needs assistance Is this a change from baseline?: Change from baseline, expected to last >3 days Toileting: Needs assistance Is this a change from baseline?: Change from baseline, expected to last >3days In/Out Bed: Needs assistance Is this a change from baseline?: Change from baseline, expected to last >3 days Walks in Home: Needs assistance Is this a change from baseline?: Change from baseline, expected to last >3 days Does the patient have difficulty walking or climbing stairs?: Yes Weakness of Legs: Both Weakness of Arms/Hands: Both  Permission Sought/Granted Permission sought to share information with : Family Supports Permission granted to share information with : Yes, Verbal Permission Granted  Share Information with NAME: Samule Life     Permission granted to share info w Relationship: daughter  Permission granted to share info w Contact Information: [phone]  Emotional Assessment Appearance:: Appears stated age Attitude/Demeanor/Rapport: Engaged Affect (typically observed): Appropriate Orientation: : Oriented to  Self, Oriented to Place, Oriented to Situation Alcohol / Substance Use: Not Applicable Psych Involvement: No (comment)  Admission diagnosis:  AKI (acute kidney injury) ([place]) [N17.9] Acute hypoxemic respiratory failure due to COVID-19 ([place]) [U07.1, J96.01] Patient Active Problem  List   Diagnosis Date Noted  . Acute hypoxemic respiratory failure due to COVID-19 ([place]) 07/03/2020  . Calculus of ureter 05/15/2020  . Bacteremia due to Gram-negative bacteria 05/04/2020  . Acute lower UTI 05/04/2020  . Acute unilateral obstructive uropathy   . SIRS (systemic inflammatory response syndrome) ([place]) 05/03/2020  . Hydronephrosis 05/02/2020  . Complication of cystostomy catheter, subsequent encounter 04/20/2020  . Acute kidney injury superimposed on CKD ([place]) 03/02/2020  . Dehydration 03/02/2020  . Nausea and vomiting 03/02/2020  . Ventral hernia without obstruction or gangrene 03/01/2020  . Hypotension 03/01/2020  . Poor appetite 03/01/2020  . Urinary tract infection 03/01/2020  . Detrusor areflexia 09/16/2019  . Cerebrovascular disease 08/17/2018  . Partial small bowel obstruction ([place]) 07/11/2017  . Choledocholithiasis 07/11/2017  . Complicated UTI (urinary tract infection)   . Sepsis secondary to UTI ([place]) 06/14/2017  . Paroxysmal atrial tachycardia ([place]) 06/14/2017  . Hypophosphatemia 06/14/2017  . Pressure injury of skin 05/26/2017  . Hydronephrosis due to obstruction of ureter 05/26/2017  . Obstructive uropathy 05/25/2017  . CKD (chronic kidney disease), stage III ([place]) 08/23/2016  . Hydronephrosis of left kidney 08/23/2016  . Kidney stones 09/13/2015  . Essential hypertension   . Pressure ulcer 08/04/2015  . Chronic diastolic (congestive) heart failure ([place]) 08/04/2015  . Hydronephrosis with obstructing calculus 08/04/2015  . Occult blood positive stool 09/05/2014  . Rash and nonspecific skin eruption 08/25/2014  . Edema 08/19/2014  . Subacute confusional state 08/06/2014  . Dilated cardiomyopathy ([place]) 07/21/2014  . Sepsis ([place]) 07/17/2014  . Severe sepsis ([place]) 07/17/2014  . Headache 03/13/2014  . CVA (cerebral infarction) 03/13/2014  . Abnormality of gait 03/13/2014  . Tremors of nervous system 10/08/2011  . Hypothyroidism 10/08/2011  . Pulmonary  nodule 10/08/2011  . Dysphagia 10/07/2011  . HTN (hypertension), malignant 10/06/2011  . Chronic suprapubic catheter ([place]) 10/06/2011  . Pulmonary fibrosis ([place]) 10/06/2011  . UTI (urinary tract infection) 10/03/2011  . PNA (pneumonia) 10/02/2011  . Junctional rhythm 10/02/2011  . Multiple sclerosis ([place]) 10/02/2011  . Chronic diarrhea 06/17/2011  . Hx of adenomatous colonic polyps 06/17/2011  . High grade dysplasia in colonic adenoma 09/29/2005   PCP:  Noreene Larsson, NP Pharmacy:   [facility] [place] [address] Phone: [phone] Fax: [phone]     Social Determinants of Health (SDOH) Interventions    Readmission Risk Interventions No flowsheet data found.

## 2020-07-05 NOTE — Consult Note (Addendum)
Consult: left hydronephrosis  Requested by: Dr. Fayrene Helper   History of Present Illness: 79 yo male patient of Dr. [name] admitted with hypoxia and Covid pneumonia and AKI. He has a h/o MS and bladder managed with SP tube. He has an atrophic right kidney. He underwent urgent staged left Perc Nx for an 8 mm left distal stone 05/03/2020 followed by cysto, bladder stone removal, left ureteroscopy, laser lithotripsy, left perc nx removal and left stent placement 05/22/2020. He was seen in office 06/19/2020 for left stent removal but it was canceled over concern of UTI (low grade fever). His SP tube was changed and he was started on abx. His wife got Covid and when pt showed up for infusion clinic he was hypoxic. His Cr was 2.94 (baseline 1.4 - 1.7). Cr now down to 1.9 and UOP 1500 ml today. [place] was done to due elevated Cr and moderate left hydro noted. Stent in good position on KUB and not obviously encrusted.   Mr. Cerasoli is feeling better and SP tube is draining well. No hematuria or flank pain. No fever. Urine cx pending.   Past Medical History:  Diagnosis Date  . Anemia   . Arthritis   . Back pain, chronic   . Bilateral carotid bruits   . C. difficile colitis 09/2011  . CAD (coronary artery disease)   . Carotid artery stenosis   . Cerebrovascular disease   . Cerebrovascular disease 08/17/2018  . Colon polyps   . Complication of cystostomy catheter, initial encounter ([place]) 04/20/2020  . Dyslipidemia   . Dysphagia 10/07/2011  . Encephalopathy   . Gait disorder   . HA (headache)   . High grade dysplasia in colonic adenoma 09/2005  . History of kidney stones   . HTN (hypertension), malignant 10/06/2011  . Hypernatremia   . Hypokalemia   . Hypothyroidism 10/08/2011  . Insomnia   . Junctional rhythm   . Kidney stones   . MS (multiple sclerosis) ([place])   . Neuromuscular disorder (H[facility])    MS  . OSA (obstructive sleep apnea)   . Paroxysmal atrial tachycardia ([place])   . Peripheral vascular disease  ([place])   . Pneumonia 4 yrs ago  . Pulmonary fibrosis ([place]) 10/06/2011  . Pulmonary nodule 10/08/2011  . PVD (peripheral vascular disease) ([place])   . Sacral ulcer ([place])   . Sleep apnea    cannot tolerate  . Stroke [facility])    left sided weakness  . Suprapubic catheter ([place])   . TIA (transient ischemic attack)   . Tremors of nervous system 10/08/2011  . Urinary tract infection   . Ventral hernia without obstruction or gangrene     Large 8X9cm ventral hernia with loss of domain. CT reads report as diastasis recti with herniation or diastasis recti.  Dr. Constance Haw, Surgery, reviewed CT with radiology and there is herniation with only hernia sac or peritoneum over the bowel and large separation of the rectus muslce (i.e. diastasis recti aka loss of domain).  No surgical intervention recommended given size, age, and health.    Past Surgical History:  Procedure Laterality Date  . APPENDECTOMY  09/2005   at time of left hemicolectomy  . BACK SURGERY  1976/1979   lower  . BILIARY DILATION N/A 03/03/2020   Procedure: BILIARY DILATION;  Surgeon: Rogene Houston, MD;  [facility];  Service: Endoscopy;  Laterality: N/A;  . CATARACT EXTRACTION W/PHACO Right 03/08/2018   Procedure: CATARACT EXTRACTION PHACO AND INTRAOCULAR LENS PLACEMENT RIGHT  EYE;  Surgeon: Tonny Branch, MD;  Location: [facility];  Service: Ophthalmology;  Laterality: Right;  CDE: 8.86  . CATARACT EXTRACTION W/PHACO Left 04/05/2018   Procedure: CATARACT EXTRACTION PHACO AND INTRAOCULAR LENS PLACEMENT (IOC);  Surgeon: Tonny Branch, MD;  Location: [facility];  Service: Ophthalmology;  Laterality: Left;  CDE: 7.36  . CHOLECYSTECTOMY     Dr. Tamala Julian  . COLON SURGERY  09/2005   [place]: four tubular adenomas, large adenomatous polyp with HIGH GRADE dysplasia  . COLONOSCOPY  11/2004   Dr. Sharol Roussel sessile polyp splenic flexure, 89mm sessile polyp desc colon, tubulovillous adenoma (bx not removed)  . COLONOSCOPY  01/2005   poor prep, polyp could  not be found  . COLONOSCOPY  05/2005   with EMR, polypectomy Dr. Olegario Messier, bx showed high grade dysplasia, partially resected  . COLONOSCOPY  09/2005   Dr. Arsenio Loader, [place] ink tattooing, four villous colon polyp (3 had been missed on previous colonoscopies due to limitations of procedures  . COLONOSCOPY  09/2006   normal TI, no polyps  . COLONOSCOPY  10/2007   Dr. Imogene Burn distal mammillations, benign bx, normal TI, random bx neg for microscopic colitis  . CYSTOSCOPY WITH LITHOLAPAXY N/A 07/27/2018   Procedure: CYSTOSCOPY WITH LITHOLAPAXY VIA  SUPRAPUBIC TUBE;  Surgeon: Franchot Gallo, MD;  Location: [facility];  Service: Urology;  Laterality: N/A;  . CYSTOSCOPY WITH RETROGRADE PYELOGRAM, URETEROSCOPY AND STENT PLACEMENT Left 06/09/2017   Procedure: CYSTOSCOPY WITH LEFT RETROGRADE PYELOGRAM, LEFT URETEROSCOPY, LEFT URETEROSCOPIC STONE EXTRACTION, LEFT URETERAL STENT PLACEMENT;  Surgeon: Franchot Gallo, MD;  Location: [facility];  Service: Urology;  Laterality: Left;  . CYSTOSCOPY/URETEROSCOPY/HOLMIUM LASER/STENT PLACEMENT Left 05/22/2020   Procedure: CYSTOSCOPY/URETEROSCOPY/STENT PLACEMENT;  Surgeon: Franchot Gallo, MD;  Location: [facility];  Service: Urology;  Laterality: Left;  . ERCP N/A 03/03/2020   Procedure: ENDOSCOPIC RETROGRADE CHOLANGIOPANCREATOGRAPHY (ERCP);  Surgeon: Rogene Houston, MD;  [facility];  Service: Endoscopy;  Laterality: N/A;  . ERCP N/A 04/20/2020   Procedure: ENDOSCOPIC RETROGRADE CHOLANGIOPANCREATOGRAPHY (ERCP);  Surgeon: Rogene Houston, MD;  [facility];  Service: Endoscopy;  Laterality: N/A;  to be done at 7:30am in OR  . GASTROINTESTINAL STENT REMOVAL N/A 04/20/2020   Procedure: STENT REMOVAL;  Surgeon: Rogene Houston, MD;  [facility];  Service: Endoscopy;  Laterality: N/A;  . INGUINAL HERNIA REPAIR  1971   bilateral  . INSERTION OF SUPRAPUBIC CATHETER  06/09/2017   Procedure: EXCHANGE OF SUPRAPUBIC CATHETER;  Surgeon:  Franchot Gallo, MD;  Location: [facility];  Service: Urology;;  . INSERTION OF SUPRAPUBIC CATHETER  05/22/2020   Procedure: SUPRAPUBIC CATHETER EXCHANGE;  Surgeon: Franchot Gallo, MD;  Location: [facility];  Service: Urology;;  . IR NEPHROSTOMY PLACEMENT LEFT  05/26/2017  . IR NEPHROSTOMY PLACEMENT LEFT  05/02/2020  . KIDNEY STONE SURGERY  09/13/2015  . LITHOTRIPSY N/A 03/03/2020   Procedure: MECHANICAL LITHOTRIPSY WITH REMOVAL OF MULTIPLE STONE FRAGMENTS;  Surgeon: Rogene Houston, MD;  [facility];  Service: Endoscopy;  Laterality: N/A;  . NEPHROLITHOTOMY Left 09/13/2015   Procedure: LEFT PERCUTANEOUS NEPHROLITHOTOMY ;  Surgeon: Franchot Gallo, MD;  Location: [facility];  Service: Urology;  Laterality: Left;  . NEPHROSTOMY TUBE REMOVAL  05/22/2020   Procedure: NEPHROSTOMY TUBE REMOVAL;  Surgeon: Franchot Gallo, MD;  Location: [facility];  Service: Urology;;  . REMOVAL OF STONES N/A 04/20/2020   Procedure: REMOVAL OF STONES;  Surgeon: Rogene Houston, MD;  [facility];  Service: Endoscopy;  Laterality: N/A;  . SPHINCTEROTOMY  03/03/2020   Procedure: BILLARY SPHINCTEROTOMY;  Surgeon: Rogene Houston, MD;  [facility];  Service: Endoscopy;;  . SUPRAPUBIC CATHETER INSERTION      Home Medications:  Medications Prior to Admission  Medication Sig Dispense Refill Last Dose  . amLODipine (NORVASC) 10 MG tablet Take 1 tablet (10 mg total) by mouth daily. (Patient taking differently: Take 5 mg by mouth daily. ) 30 tablet 1 07/03/2020 at Unknown time  . carvedilol (COREG) 6.25 MG tablet Take 6.25 mg by mouth 2 (two) times daily with a meal.   07/03/2020 at 0800  . diazepam (VALIUM) 5 MG tablet TAKE (1) TABLET BY MOUTH TWICE DAILY. (Patient taking differently: Take 5 mg by mouth in the morning and at bedtime. ) 60 tablet 3 07/03/2020 at Unknown time  . HYDROcodone-acetaminophen (NORCO) 7.5-325 MG per tablet Take 1 tablet by mouth 2 (two) times daily. Max APAP 3 GM IN 24 HOURS  FROM ALL SOURCES (Patient taking differently: Take 1 tablet by mouth in the morning and at bedtime. ALL MEDICATIONS TO BE CRUSHED AND PLACED IN APPLESAUCE) 60 tablet 0 07/03/2020 at Unknown time  . levothyroxine (SYNTHROID) 75 MCG tablet Take 75 mcg by mouth daily before breakfast. ALL MEDICATIONS TO BE CRUSHED AND PLACED IN APPLESAUCE   07/03/2020 at Unknown time  . mirabegron ER (MYRBETRIQ) 25 MG TB24 tablet Take 1 tablet (25 mg total) by mouth daily as needed (bladder spasms). (Patient not taking: Reported on 07/03/2020) 20 tablet 0 Not Taking at Unknown time  . sulfamethoxazole-trimethoprim (BACTRIM DS) 800-160 MG tablet Take 1 tablet by mouth 2 (two) times daily. (Patient not taking: Reported on 07/03/2020) 6 tablet 0 Not Taking at Unknown time   Allergies:  Allergies  Allergen Reactions  . Tetracyclines & Related Anaphylaxis and Rash  . Ciprofloxacin     Trouble swallowing unknown reaction according to wife     Family History  Problem Relation Age of Onset  . Cirrhosis Brother        etoh  . Stroke Mother 3  . Coronary artery disease Father 28  . Heart attack Brother   . Cancer Sister   . Multiple sclerosis Other   . Colon cancer Neg Hx    Social History:  reports that he quit smoking about 32 years ago. His smoking use included cigarettes. He has a 25.00 pack-year smoking history. He has never used smokeless tobacco. He reports that he does not drink alcohol and does not use drugs.  ROS: A complete review of systems was performed.  All systems are negative except for pertinent findings as noted. Review of Systems  Respiratory: Positive for cough.   All other systems reviewed and are negative.    Physical Exam:  Vital signs in last 24 hours: Temp:  [97.6 F (36.4 C)-97.8 F (36.6 C)] 97.8 F (36.6 C) (10/07 2110) Pulse Rate:  [52-66] 63 (10/07 2110) Resp:  [16-17] 17 (10/07 2110) BP: (109-131)/(62-82) 117/62 (10/07 2110) SpO2:  [94 %] 94 % (10/07 2110) Weight:  [52.7  kg] 52.7 kg (10/07 0500)    Intake/Output Summary (Last 24 hours) at 07/05/2020 2118 Last data filed at 07/05/2020 1856 Gross per 24 hour  Intake 1475.66 ml  Output 1550 ml  Net -74.34 ml     General:  Alert and oriented, No acute distress HEENT: Normocephalic, atraumatic Cardiovascular: Regular rate and rhythm Lungs: Regular rate and effort Abdomen: Soft, nontender, nondistended, no abdominal masses Back: No CVA  tenderness Extremities: No edema Neurologic: Grossly intact, slow speech GU: 24 Fr SP tube, urine clear   Laboratory Data:  Results for orders placed or performed during the hospital encounter of 07/03/20 (from the past 24 hour(s))  CBC with Differential/Platelet     Status: Abnormal   Collection Time: 07/05/20  4:08 AM  Result Value Ref Range   WBC 7.7 4.0 - 10.5 K/uL   RBC 3.65 (L) 4.22 - 5.81 MIL/uL   Hemoglobin 11.1 (L) 13.0 - 17.0 g/dL   HCT 36.8 (L) 39 - 52 %   MCV 100.8 (H) 80.0 - 100.0 fL   MCH 30.4 26.0 - 34.0 pg   MCHC 30.2 30.0 - 36.0 g/dL   RDW 14.4 11.5 - 15.5 %   Platelets 111 (L) 150 - 400 K/uL   nRBC 0.0 0.0 - 0.2 %   Neutrophils Relative % 79 %   Neutro Abs 6.1 1.7 - 7.7 K/uL   Lymphocytes Relative 16 %   Lymphs Abs 1.3 0.7 - 4.0 K/uL   Monocytes Relative 4 %   Monocytes Absolute 0.3 0.1 - 1.0 K/uL   Eosinophils Relative 0 %   Eosinophils Absolute 0.0 0 - 0 K/uL   Basophils Relative 0 %   Basophils Absolute 0.0 0 - 0 K/uL   Immature Granulocytes 1 %   Abs Immature Granulocytes 0.06 0.00 - 0.07 K/uL  Comprehensive metabolic panel     Status: Abnormal   Collection Time: 07/05/20  4:08 AM  Result Value Ref Range   Sodium 152 (H) 135 - 145 mmol/L   Potassium 3.8 3.5 - 5.1 mmol/L   Chloride 121 (H) 98 - 111 mmol/L   CO2 21 (L) 22 - 32 mmol/L   Glucose, Bld 134 (H) 70 - 99 mg/dL   BUN 60 (H) 8 - 23 mg/dL   Creatinine, Ser 2.02 (H) 0.61 - 1.24 mg/dL   Calcium 8.4 (L) 8.9 - 10.3 mg/dL   Total Protein 6.3 (L) 6.5 - 8.1 g/dL   Albumin 2.8 (L)  3.5 - 5.0 g/dL   AST 44 (H) 15 - 41 U/L   ALT 18 0 - 44 U/L   Alkaline Phosphatase 51 38 - 126 U/L   Total Bilirubin 0.4 0.3 - 1.2 mg/dL   GFR calc non Af Amer 31 (L) >60 mL/min   Anion gap 10 5 - 15  C-reactive protein     Status: Abnormal   Collection Time: 07/05/20  4:08 AM  Result Value Ref Range   CRP 7.1 (H) <1.0 mg/dL  D-dimer, quantitative (not at [facility])     Status: Abnormal   Collection Time: 07/05/20  4:08 AM  Result Value Ref Range   D-Dimer, Quant 1.48 (H) 0.00 - 0.50 ug/mL-FEU  Ferritin     Status: Abnormal   Collection Time: 07/05/20  4:08 AM  Result Value Ref Range   Ferritin 865 (H) 24 - 336 ng/mL  Glucose, capillary     Status: Abnormal   Collection Time: 07/05/20  7:49 AM  Result Value Ref Range   Glucose-Capillary 146 (H) 70 - 99 mg/dL  Glucose, capillary     Status: Abnormal   Collection Time: 07/05/20 11:19 AM  Result Value Ref Range   Glucose-Capillary 267 (H) 70 - 99 mg/dL  Basic metabolic panel     Status: Abnormal   Collection Time: 07/05/20  5:23 PM  Result Value Ref Range   Sodium 153 (H) 135 - 145 mmol/L   Potassium 3.8 3.5 -  5.1 mmol/L   Chloride 119 (H) 98 - 111 mmol/L   CO2 23 22 - 32 mmol/L   Glucose, Bld 191 (H) 70 - 99 mg/dL   BUN 65 (H) 8 - 23 mg/dL   Creatinine, Ser 1.91 (H) 0.61 - 1.24 mg/dL   Calcium 8.8 (L) 8.9 - 10.3 mg/dL   GFR calc non Af Amer 33 (L) >60 mL/min   Anion gap 11 5 - 15   Recent Results (from the past 240 hour(s))  Novel Coronavirus, NAA ([facility])     Status: Abnormal   Collection Time: 06/29/20  5:57 PM   Specimen: Nasopharyngeal(NP) swabs in vial transport medium   Nasopharynge  Screenin  Result Value Ref Range Status   SARS-CoV-2, NAA Detected (A) Not Detected Final    Comment: Patients who have a positive COVID-19 test result may now have treatment options. Treatment options are available for patients with mild to moderate symptoms and for hospitalized patients. Visit our website at  [email or web address] for resources and information. This nucleic acid amplification test was developed and its performance characteristics determined by [employer]. Nucleic acid amplification tests include RT-PCR and TMA. This test has not been FDA cleared or approved. This test has been authorized by FDA under an Emergency Use Authorization (EUA). This test is only authorized for the duration of time the declaration that circumstances exist justifying the authorization of the emergency use of in vitro diagnostic tests for detection of SARS-CoV-2 virus and/or diagnosis of COVID-19 infection under section 564(b)(1) of the Act, 21 U.S.C. 062IRS-8(N) (1), unless the authorization is terminated or revoked sooner. When diagnostic testing is negativ e, the possibility of a false negative result should be considered in the context of a patient's recent exposures and the presence of clinical signs and symptoms consistent with COVID-19. An individual without symptoms of COVID-19 and who is not shedding SARS-CoV-2 virus would expect to have a negative (not detected) result in this assay.   SARS-COV-2, NAA 2 DAY TAT     Status: None   Collection Time: 06/29/20  5:57 PM   Nasopharynge  Screenin  Result Value Ref Range Status   SARS-CoV-2, NAA 2 DAY TAT Performed  Final  Blood Culture (routine x 2)     Status: None (Preliminary result)   Collection Time: 07/03/20  2:01 PM   Specimen: BLOOD  Result Value Ref Range Status   Specimen Description   Final    BLOOD LEFT ANTECUBITAL Performed at [facility] [address] [place], [place] [address]    Special Requests   Final    BOTTLES DRAWN AEROBIC AND ANAEROBIC Blood Culture results may not be optimal due to an inadequate volume of blood received in culture bottles Performed at [facility] [address] [place], [place] [address]    Culture   Final    NO GROWTH 2 DAYS Performed  at [facility] [address] [place], [place] [address]    Report Status PENDING  Incomplete   Creatinine: Recent Labs    07/03/20 1401 07/04/20 0428 07/05/20 0408 07/05/20 1723  CREATININE 2.94* 2.17* 2.02* 1.91*    Impression/Assessment/plan:  Mr. Witt has COVID pneumonia, MS, SP tube, atrophic right kidney, s/p left URS/HLL/stent 8/24 did not get stent out in office (UTI) now with left hydro. Cr improving and UOP good. I would continue to monitor and if Cr increased would repeat renal US and have low threshold for IR to replace the left  nephrostomy tube. Placement of nephrstomy now could have undue risk of patient and renal fxn is improving.   I will notify Dr. [name] of admission.   Festus Aloe 07/05/2020, 9:12 PM

## 2020-07-05 NOTE — Evaluation (Signed)
Clinical/Bedside Swallow Evaluation Patient Details  Name: Jordan Ward MRN: [id number] Date of Birth: 1940/11/05  Today's Date: 07/05/2020 Time: SLP Start Time (ACUTE ONLY): 75 SLP Stop Time (ACUTE ONLY): 8527 SLP Time Calculation (min) (ACUTE ONLY): 24 min  Past Medical History:  Past Medical History:  Diagnosis Date   Anemia    Arthritis    Back pain, chronic    Bilateral carotid bruits    C. difficile colitis 09/2011   CAD (coronary artery disease)    Carotid artery stenosis    Cerebrovascular disease    Cerebrovascular disease 08/17/2018   Colon polyps    Complication of cystostomy catheter, initial encounter ([place]) 04/20/2020   Dyslipidemia    Dysphagia 10/07/2011   Encephalopathy    Gait disorder    HA (headache)    High grade dysplasia in colonic adenoma 09/2005   History of kidney stones    HTN (hypertension), malignant 10/06/2011   Hypernatremia    Hypokalemia    Hypothyroidism 10/08/2011   Insomnia    Junctional rhythm    Kidney stones    MS (multiple sclerosis) (HCC)    Neuromuscular disorder (HCC)    MS   OSA (obstructive sleep apnea)    Paroxysmal atrial tachycardia (HCC)    Peripheral vascular disease ([place])    Pneumonia 4 yrs ago   Pulmonary fibrosis ([place]) 10/06/2011   Pulmonary nodule 10/08/2011   PVD (peripheral vascular disease) ([place])    Sacral ulcer (HCC)    Sleep apnea    cannot tolerate   Stroke ([place])    left sided weakness   Suprapubic catheter ([name])    TIA (transient ischemic attack)    Tremors of nervous system 10/08/2011   Urinary tract infection    Ventral hernia without obstruction or gangrene     Large 8X9cm ventral hernia with loss of domain. CT reads report as diastasis recti with herniation or diastasis recti.  Dr. Constance Haw, Surgery, reviewed CT with radiology and there is herniation with only hernia sac or peritoneum over the bowel and large separation of the rectus muslce (i.e. diastasis recti aka loss  of domain).  No surgical intervention recommended given size, age, and health.    Past Surgical History:  Past Surgical History:  Procedure Laterality Date   APPENDECTOMY  09/2005   at time of left hemicolectomy   BACK SURGERY  1976/1979   lower   BILIARY DILATION N/A 03/03/2020   Procedure: BILIARY DILATION;  Surgeon: Rogene Houston, MD;  [facility];  Service: Endoscopy;  Laterality: N/A;   CATARACT EXTRACTION W/PHACO Right 03/08/2018   Procedure: CATARACT EXTRACTION PHACO AND INTRAOCULAR LENS PLACEMENT RIGHT EYE;  Surgeon: Tonny Branch, MD;  Location: [facility];  Service: Ophthalmology;  Laterality: Right;  CDE: 8.86   CATARACT EXTRACTION W/PHACO Left 04/05/2018   Procedure: CATARACT EXTRACTION PHACO AND INTRAOCULAR LENS PLACEMENT (IOC);  Surgeon: Tonny Branch, MD;  Location: [facility];  Service: Ophthalmology;  Laterality: Left;  CDE: 7.36   CHOLECYSTECTOMY     Dr. Tamala Julian   COLON SURGERY  09/2005   [place]man: four tubular adenomas, large adenomatous polyp with HIGH GRADE dysplasia   COLONOSCOPY  11/2004   Dr. Sharol Roussel sessile polyp splenic flexure, 39mm sessile polyp desc colon, tubulovillous adenoma (bx not removed)   COLONOSCOPY  01/2005   poor prep, polyp could not be found   COLONOSCOPY  05/2005   with EMR, polypectomy Dr. Olegario Messier, bx showed high grade dysplasia, partially resected   COLONOSCOPY  09/2005   Dr. Arsenio Loader, [place] ink tattooing, four villous colon polyp (3 had been missed on previous colonoscopies due to limitations of procedures   COLONOSCOPY  09/2006   normal TI, no polyps   COLONOSCOPY  10/2007   Dr. Imogene Burn distal mammillations, benign bx, normal TI, random bx neg for microscopic colitis   CYSTOSCOPY WITH LITHOLAPAXY N/A 07/27/2018   Procedure: CYSTOSCOPY WITH LITHOLAPAXY VIA  SUPRAPUBIC TUBE;  Surgeon: Franchot Gallo, MD;  Location: [facility];  Service: Urology;  Laterality: N/A;   CYSTOSCOPY WITH RETROGRADE PYELOGRAM, URETEROSCOPY  AND STENT PLACEMENT Left 06/09/2017   Procedure: CYSTOSCOPY WITH LEFT RETROGRADE PYELOGRAM, LEFT URETEROSCOPY, LEFT URETEROSCOPIC STONE EXTRACTION, LEFT URETERAL STENT PLACEMENT;  Surgeon: Franchot Gallo, MD;  Location: [facility];  Service: Urology;  Laterality: Left;   CYSTOSCOPY/URETEROSCOPY/HOLMIUM LASER/STENT PLACEMENT Left 05/22/2020   Procedure: CYSTOSCOPY/URETEROSCOPY/STENT PLACEMENT;  Surgeon: Franchot Gallo, MD;  Location: [facility];  Service: Urology;  Laterality: Left;   ERCP N/A 03/03/2020   Procedure: ENDOSCOPIC RETROGRADE CHOLANGIOPANCREATOGRAPHY (ERCP);  Surgeon: Rogene Houston, MD;  [facility];  Service: Endoscopy;  Laterality: N/A;   ERCP N/A 04/20/2020   Procedure: ENDOSCOPIC RETROGRADE CHOLANGIOPANCREATOGRAPHY (ERCP);  Surgeon: Rogene Houston, MD;  [facility];  Service: Endoscopy;  Laterality: N/A;  to be done at 7:30am in [place] N/A 04/20/2020   Procedure: STENT REMOVAL;  Surgeon: Rogene Houston, MD;  [facility];  Service: Endoscopy;  Laterality: N/A;   INGUINAL HERNIA REPAIR  1971   bilateral   INSERTION OF SUPRAPUBIC CATHETER  06/09/2017   Procedure: EXCHANGE OF SUPRAPUBIC CATHETER;  Surgeon: Franchot Gallo, MD;  Location: [facility];  Service: Urology;;   INSERTION OF SUPRAPUBIC CATHETER  05/22/2020   Procedure: SUPRAPUBIC CATHETER EXCHANGE;  Surgeon: Franchot Gallo, MD;  Location: [facility];  Service: Urology;;   IR NEPHROSTOMY PLACEMENT LEFT  05/26/2017   IR NEPHROSTOMY PLACEMENT LEFT  05/02/2020   KIDNEY STONE SURGERY  09/13/2015   LITHOTRIPSY N/A 03/03/2020   Procedure: MECHANICAL LITHOTRIPSY WITH REMOVAL OF MULTIPLE STONE FRAGMENTS;  Surgeon: Rogene Houston, MD;  [facility];  Service: Endoscopy;  Laterality: N/A;   NEPHROLITHOTOMY Left 09/13/2015   Procedure: LEFT PERCUTANEOUS NEPHROLITHOTOMY ;  Surgeon: Franchot Gallo, MD;  Location: [facility];  Service: Urology;  Laterality:  Left;   NEPHROSTOMY TUBE REMOVAL  05/22/2020   Procedure: NEPHROSTOMY TUBE REMOVAL;  Surgeon: Franchot Gallo, MD;  [facility]: [facility];  Service: Urology;;   REMOVAL OF STONES N/A 04/20/2020   Procedure: REMOVAL OF STONES;  Surgeon: Rogene Houston, MD;  [facility];  Service: Endoscopy;  Laterality: N/A;   SPHINCTEROTOMY  03/03/2020   Procedure: BILLARY SPHINCTEROTOMY;  Surgeon: Rogene Houston, MD;  [facility];  Service: Endoscopy;;   SUPRAPUBIC CATHETER INSERTION     HPI:  79 yo with complex med hx including MS and chronic dysphagia dating back to at least 2013 - stroke hx with left sided weakness admitted with COVID.   Prior MBS studies for dysphagia have shown asp of thin, thickened liquids 2013 and 2016 - 09/2014-07/2015 after which he stopped using per notes.  Pt advised he does not like thick liquids to prior SLP.  He also has a  Zenker's - seen on prior MBS.  Pt found to have RU and RM lobe pna CXR.  Swallow eval ordered. Upon entrance to room, pt presents with a large amount of meat retained in his  oral cavity though he had finished his meal.   Assessment / Plan / Recommendation Clinical Impression  Patient presents with clinical indications of aspiration with thin liquids c/b cough post-swallow and oral pocketing of fibrous meat - bilaterally initially with residuals in left lateral sulci.  He required max verbal cues to clear retained meat by expectorating and eventually SLP provided him with applesauce to clear - finally needing to brush his teeth for last small amount.  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  No indication of aspiration with puree nor nectar thick liquids.  Pt admits to h/o coughing with thin liquids and better tolerance of Ensure.       Given pt currently has COVID and thus is at increased risk of asp pna, advise  he modify his diet to mitgate risk - to which he agreed "temporarily".  Inquired if pt would participate in MBS to allow instrumental eval given his report of worsening dysphagia since fall and h/o Zenker's.   He was not sure re: this possiblity - thus will modify to dys3/nectar and allow ice and tsps thin water between meals.  Will follow up for dysphagia management. SLP Visit Diagnosis: Dysphagia, oropharyngeal phase (R13.12);Dysphagia, pharyngoesophageal phase (R13.14)    Aspiration Risk  Moderate aspiration risk    Diet Recommendation Dysphagia 3 (Mech soft);Nectar-thick liquid;Ice chips PRN after oral care (ice chips ok any time, tsps thin water ok between meals)   Liquid Administration via: Cup;Straw Medication Administration: Whole meds with puree Supervision: Patient able to self feed Compensations: Slow rate;Small sips/bites;Other (Comment) (pt to swish and expectorate with water after meals, applesauce with every meals)    Other  Recommendations     Follow up Recommendations        Frequency and Duration min 1 x/week  1 week       Prognosis Prognosis for Safe Diet Advancement: Fair Barriers to Reach Goals: Time post onset      Swallow Study   General HPI: 79 yo with complex med hx including MS and chronic dysphagia dating back to at least 2013 - stroke hx with left sided weakness admitted with COVID.   Prior MBS studies for dysphagia have shown asp of thin, thickened liquids 2013 and 2016 - 09/2014-07/2015 after which he stopped using per notes.  Pt advised he does not like thick liquids to prior SLP.  He also has a  Zenker's - seen on prior MBS.  Pt found to have RU and RM lobe pna CXR.  Swallow eval ordered. Upon entrance to room, pt presents with a large amount of meat retained in his oral cavity though he had finished his meal. Type of Study: Bedside Swallow Evaluation Previous Swallow Assessment: see HPI, h/o dysphagia which pt states has worsened since his fall Diet  Prior to this Study: Regular;Thin liquids Temperature Spikes Noted: No Respiratory Status: Nasal cannula History of Recent Intubation: No Behavior/Cognition: Alert;Cooperative;Pleasant mood Oral Cavity Assessment: Within Functional Limits Oral Care Completed by SLP: Yes (toothbrush and paste given to pt) Oral Cavity - Dentition: Adequate natural dentition Vision: Functional for self-feeding Self-Feeding Abilities: Able to feed self Patient Positioning: Upright in bed Baseline Vocal Quality: Normal Volitional Cough: Strong Volitional Swallow: Able to elicit    Oral/Motor/Sensory Function Overall Oral Motor/Sensory Function: Within functional limits   Ice Chips Ice chips: Not tested   Thin Liquid Thin Liquid: Impaired Presentation: Cup;Self Fed  Pharyngeal  Phase Impairments: Cough - Immediate    Nectar Thick Nectar Thick Liquid: Impaired Presentation: Cup;Self Fed;Straw Pharyngeal Phase Impairments: Cough - Delayed;Suspected delayed Swallow (subtle)   Honey Thick Honey Thick Liquid: Not tested   Puree Puree: Within functional limits Presentation: Self Fed;Spoon   Solid     Solid: Impaired Presentation: Self Fed Oral Phase Functional Implications: Prolonged oral transit;Left lateral sulci pocketing;Oral residue     Kathleen Lime, MS [facility] SLP Acute Rehab Services Office [phone]  Macario Golds 07/05/2020,2:22 PM

## 2020-07-06 DIAGNOSIS — J9601 Acute respiratory failure with hypoxia: Secondary | ICD-10-CM | POA: Diagnosis not present

## 2020-07-06 DIAGNOSIS — U071 COVID-19: Secondary | ICD-10-CM | POA: Diagnosis not present

## 2020-07-06 LAB — COMPREHENSIVE METABOLIC PANEL
ALT: 20 U/L (ref 0–44)
AST: 37 U/L (ref 15–41)
Albumin: 2.8 g/dL — ABNORMAL LOW (ref 3.5–5.0)
Alkaline Phosphatase: 54 U/L (ref 38–126)
Anion gap: 11 (ref 5–15)
BUN: 63 mg/dL — ABNORMAL HIGH (ref 8–23)
CO2: 20 mmol/L — ABNORMAL LOW (ref 22–32)
Calcium: 8.5 mg/dL — ABNORMAL LOW (ref 8.9–10.3)
Chloride: 116 mmol/L — ABNORMAL HIGH (ref 98–111)
Creatinine, Ser: 1.69 mg/dL — ABNORMAL HIGH (ref 0.61–1.24)
GFR calc non Af Amer: 38 mL/min — ABNORMAL LOW (ref >60)
Glucose, Bld: 213 mg/dL — ABNORMAL HIGH (ref 70–99)
Potassium: 3.1 mmol/L — ABNORMAL LOW (ref 3.5–5.1)
Sodium: 147 mmol/L — ABNORMAL HIGH (ref 135–145)
Total Bilirubin: 0.6 mg/dL (ref 0.3–1.2)
Total Protein: 6.3 g/dL — ABNORMAL LOW (ref 6.5–8.1)

## 2020-07-06 LAB — URINE CULTURE

## 2020-07-06 LAB — CBC WITH DIFFERENTIAL/PLATELET
Abs Immature Granulocytes: 0.08 10*3/uL — ABNORMAL HIGH (ref 0.00–0.07)
Basophils Absolute: 0 10*3/uL (ref 0.0–0.1)
Basophils Relative: 0 %
Eosinophils Absolute: 0 10*3/uL (ref 0.0–0.5)
Eosinophils Relative: 0 %
HCT: 35.7 % — ABNORMAL LOW (ref 39.0–52.0)
Hemoglobin: 11.5 g/dL — ABNORMAL LOW (ref 13.0–17.0)
Immature Granulocytes: 1 %
Lymphocytes Relative: 12 %
Lymphs Abs: 1 10*3/uL (ref 0.7–4.0)
MCH: 30.5 pg (ref 26.0–34.0)
MCHC: 32.2 g/dL (ref 30.0–36.0)
MCV: 94.7 fL (ref 80.0–100.0)
Monocytes Absolute: 0.3 10*3/uL (ref 0.1–1.0)
Monocytes Relative: 4 %
Neutro Abs: 7.1 10*3/uL (ref 1.7–7.7)
Neutrophils Relative %: 83 %
Platelets: 113 10*3/uL — ABNORMAL LOW (ref 150–400)
RBC: 3.77 MIL/uL — ABNORMAL LOW (ref 4.22–5.81)
RDW: 14.2 % (ref 11.5–15.5)
WBC: 8.5 10*3/uL (ref 4.0–10.5)
nRBC: 0 % (ref 0.0–0.2)

## 2020-07-06 LAB — PHOSPHORUS: Phosphorus: 1.3 mg/dL — ABNORMAL LOW (ref 2.5–4.6)

## 2020-07-06 LAB — FERRITIN: Ferritin: 903 ng/mL — ABNORMAL HIGH (ref 24–336)

## 2020-07-06 LAB — GLUCOSE, CAPILLARY
Glucose-Capillary: 174 mg/dL — ABNORMAL HIGH (ref 70–99)
Glucose-Capillary: 233 mg/dL — ABNORMAL HIGH (ref 70–99)
Glucose-Capillary: 199 mg/dL — ABNORMAL HIGH (ref 70–99)
Glucose-Capillary: 222 mg/dL — ABNORMAL HIGH (ref 70–99)

## 2020-07-06 LAB — D-DIMER, QUANTITATIVE: D-Dimer, Quant: 1.34 ug/mL-FEU — ABNORMAL HIGH (ref 0.00–0.50)

## 2020-07-06 LAB — MAGNESIUM: Magnesium: 2.2 mg/dL (ref 1.7–2.4)

## 2020-07-06 LAB — C-REACTIVE PROTEIN: CRP: 4.2 mg/dL — ABNORMAL HIGH (ref <1.0)

## 2020-07-06 MED ORDER — IPRATROPIUM-ALBUTEROL 20-100 MCG/ACT IN AERS
1.0000 | INHALATION_SPRAY | Freq: Two times a day (BID) | RESPIRATORY_TRACT | Status: DC
  Administered 2020-07-06 – 2020-07-13 (×14): 1 via RESPIRATORY_TRACT

## 2020-07-06 MED ORDER — DEXTROSE 5 % IV SOLN: INTRAVENOUS | Status: AC

## 2020-07-06 MED ORDER — INSULIN ASPART 100 UNIT/ML ~~LOC~~ SOLN
2.0000 [IU] | Freq: Three times a day (TID) | SUBCUTANEOUS | Status: DC
  Administered 2020-07-06 – 2020-07-13 (×21): 2 [IU] via SUBCUTANEOUS

## 2020-07-06 MED ORDER — SODIUM CHLORIDE 0.9 % IV SOLN
1.0000 g | INTRAVENOUS | Status: AC
  Administered 2020-07-06 – 2020-07-10 (×5): 1 g via INTRAVENOUS
  Filled 2020-07-06 (×5): qty 10
  Filled 2020-07-06: qty 1

## 2020-07-06 MED ORDER — LINAGLIPTIN 5 MG PO TABS
5.0000 mg | ORAL_TABLET | Freq: Every day | ORAL | Status: DC
  Administered 2020-07-06 – 2020-07-13 (×8): 5 mg via ORAL
  Filled 2020-07-06 (×8): qty 1

## 2020-07-06 MED ORDER — K PHOS MONO-SOD PHOS DI & MONO 155-852-130 MG PO TABS
500.0000 mg | ORAL_TABLET | Freq: Four times a day (QID) | ORAL | Status: DC
  Administered 2020-07-06 – 2020-07-07 (×6): 500 mg via ORAL
  Filled 2020-07-06 (×6): qty 2

## 2020-07-06 MED ORDER — IPRATROPIUM-ALBUTEROL 20-100 MCG/ACT IN AERS
1.0000 | INHALATION_SPRAY | Freq: Four times a day (QID) | RESPIRATORY_TRACT | Status: DC | PRN
  Administered 2020-07-06 – 2020-07-11 (×2): 1 via RESPIRATORY_TRACT

## 2020-07-06 MED ORDER — POTASSIUM CHLORIDE CRYS ER 20 MEQ PO TBCR
40.0000 meq | EXTENDED_RELEASE_TABLET | Freq: Once | ORAL | Status: AC
  Administered 2020-07-06: 40 meq via ORAL
  Filled 2020-07-06: qty 2

## 2020-07-06 NOTE — Progress Notes (Addendum)
PROGRESS NOTE    Jordan Ward  ULA:[id number] DOB: [birth year] DOA: 07/03/2020 PCP: Noreene Larsson, NP   Chief Complaint  Patient presents with  . Weakness    Brief Narrative:  Per previous HPI Patient is Jordan Ward poor historian and much of the history is obtained from prior notes  Jordan Ward 79 year old male who has not been vaccinated against COVID-19 with [name] complex medical history including MS with suprapubic catheter and recurrent Proteus UTIs, HFpEF, recent left ureteroscopy with stone extraction and stent placement on 8/24, CKD 3b, vascular disease, hypertension, stroke with left-sided weakness who presented today to the ED from the monoclonal antibody infusion center with acute hypoxic respiratory failure.  He was diagnosed with COVID-19 on October 1 but is unsure of when his symptoms first started but started with Jordan Pillars mild cough.  Wife is Jordan Ward positive sick contact.  He was referred to the infusion center and was noted to be desaturating to the 70s on room air and was transferred to the ED initially requiring 7 LPM to maintain sats greater than 90%.  No fever, chills, nausea, vomiting, diarrhea, rash.  ED Course: Afebrile, bradycardic (58), hypotensive (124/75 -> 89/61 ->95/53) requiring 6 LPM.  Notable labs: Sodium 147, glucose 111, BUN 55, creatinine 2.94 (previously 1.74 on 8/9, baseline 1.3), LDH, ferritin 804, CRP 12.4, procalcitonin negative, lactic acid 1.3, WBC 4.7, Hb 12, platelets 100, D-dimer 2.4, fibrinogen 431, COVID-19 positive.  CXR with RML and RLL ill-defined opacity.  Assessment & Plan:   Principal Problem:   Acute hypoxemic respiratory failure due to COVID-19 [name]) Active Problems:   Multiple sclerosis (HCC)   Hypothyroidism   Chronic diastolic (congestive) heart failure (HCC)   Essential hypertension   Cerebrovascular disease   Hypotension   Acute kidney injury superimposed on CKD ([name])  1. Acute hypoxic respiratory failure secondary to COVID-19  Possibly developing  COVID encephalopathy Jordan Ward. Unvaccinated b. CXR with ill defined opacity R mid lung and R base, likely representing atypical organism pneumonia c. Follow repeat CXR 10/7 with mild infiltrate R upper lung and R mid lung, chronic interstitial lung disease d. Currently requiring 3 L [place] (on 6 L at presentation) - wean as tolerated, stable today e. Procalcitonin negative - hold off antibiotics for now f. Remdesivir x 5 days, Steroids x 10 days, baricitinib (renal dosing) g. Strict I/O, daily weights h. Encourage Incentive Spirometry, Flutter Valve and Pronation as tolerated.  OOB.  Therapy.  COVID-19 Labs  Recent Labs    07/03/20 1401 07/03/20 1401 07/04/20 0428 07/05/20 0408 07/06/20 0556  DDIMER 2.42*   < > 1.95* 1.48* 1.34*  FERRITIN 804*   < > 837* 865* 903*  LDH 411*  --   --   --   --   CRP 12.4*   < > 12.7* 7.1* 4.2*   < > = values in Jordan interval not displayed.    Lab Results  Component Value Date   SARSCOV2NAA Detected (Jordan Ward) 06/29/2020   [place] NEGATIVE 05/21/2020   [place] NEGATIVE 05/02/2020   [place] NEGATIVE 04/18/2020   2. Elevated D dimer 1. Likely 2/2 covid 19 infection - will follow LE [place] (negative for DVT) 2. Trend, consider additional imaging as needed  3. Multiple sclerosis with neurogenic bladder and suprapubic catheter  Jordan Ward. Not on any Biologics or immunomodulators b. Continue home pain regimen: Norco and Valium c. PT  3. Compensated HFpEF Jordan Ward. Holding home Coreg due to hypotension  4. AKI on CKD 3b  Left  Sided Hydronephrosis Jordan Ward. Baseline creatinine 1.3-1.5, peaked at 2.94 b. Improving today, follow with IVF c. Follow UA -> appears c/w UTI (see below) d. Follow renal US -> left sided hydro, discussed with urology who will eval -> recommending outpatient follow up and suppressive antibiotics when ready for discharge (discussed with Dr. Junious Silk Jordan AM)  5. Asymptomatic Bacteruria  Suprapubic Catheter 1. UA with +nitrite and LE, hematuria    2. He's asymptomatic from Jordan, will follow urine cx 3. Will treat with ceftriaxone (in setting of hydro and treatment with steroids/baricitinib), follow final cx  6. Hypertension now with hypotension Jordan Ward. BP improved b. Hold home antihypertensives (coreg, amlodipine)  6. Hypothyroidism Jordan Ward. Continue home Synthroid  7. History of stroke with left-sided weakness  8. Hypokalemia  Hypophosphatemia: replace and follow  9. Hypernatremia: continue D5w, improving  10. Prediabetes  Hyperglycemia 2/2 Steroids: follow A1c (6.3), SSI  11. Thrombocytopenia: mild, some degree of chronicity  DVT prophylaxis: heparin Code Status: full  Family Communication: none at bedside - 10/8 wife Disposition:   Status is: Inpatient  Remains inpatient appropriate because:Inpatient level of care appropriate due to severity of illness   Dispo: The patient is from: Home              Anticipated d/c is to: pending              Anticipated d/c date is: > 3 days              Patient currently is not medically stable to d/c.       Consultants:   none  Procedures:   none  Antimicrobials:  Anti-infectives (From admission, onward)   Start     Dose/Rate Route Frequency Ordered Stop   07/06/20 1000  cefTRIAXone (ROCEPHIN) 1 g in sodium chloride 0.9 % 100 mL IVPB        1 g 200 mL/hr over 30 Minutes Intravenous Every 24 hours 07/06/20 0904     07/04/20 1000  remdesivir 100 mg in sodium chloride 0.9 % 100 mL IVPB  Status:  Discontinued       "Followed by" Linked Group Details   100 mg 200 mL/hr over 30 Minutes Intravenous Every 24 hours 07/03/20 1559 07/03/20 1845   07/04/20 1000  remdesivir 100 mg in sodium chloride 0.9 % 100 mL IVPB  Status:  Discontinued       "Followed by" Linked Group Details   100 mg 200 mL/hr over 30 Minutes Intravenous Daily 07/03/20 1845 07/03/20 1905   07/04/20 1000  remdesivir 100 mg in sodium chloride 0.9 % 100 mL IVPB        100 mg 200 mL/hr over 30 Minutes  Intravenous Every 24 hours 07/03/20 2008 07/08/20 0959   07/03/20 1845  remdesivir 200 mg in sodium chloride 0.9% 250 mL IVPB  Status:  Discontinued       "Followed by" Linked Group Details   200 mg 580 mL/hr over 30 Minutes Intravenous Once 07/03/20 1845 07/03/20 1905   07/03/20 1700  remdesivir 200 mg in sodium chloride 0.9% 250 mL IVPB       "Followed by" Linked Group Details   200 mg 580 mL/hr over 30 Minutes Intravenous Once 07/03/20 1559 07/03/20 1800      Subjective: Overall feels generally poorly   Objective: Vitals:   07/06/20 0428 07/06/20 0500 07/06/20 0800 07/06/20 1201  BP: 114/62     Pulse: 65     Resp: 16     Temp:  97.7 F (36.5 C)     TempSrc: Oral     SpO2: 94%  91% 90%  Weight:  51.7 kg    Height:        Intake/Output Summary (Last 24 hours) at 07/06/2020 1236 Last data filed at 07/06/2020 0855 Gross per 24 hour  Intake 1470.66 ml  Output 1375 ml  Net 95.66 ml   Filed Weights   07/03/20 1949 07/05/20 0500 07/06/20 0500  Weight: 50.5 kg 52.7 kg 51.7 kg    Examination:  General: No acute distress. Cardiovascular: Heart sounds show Calistro Rauf regular rate, and rhythm. Lungs: bibasilar crackles Abdomen: Soft, nontender, nondistended Neurological: Alert and oriented 3. Moves all extremities 4. Cranial nerves II through XII grossly intact. Skin: Warm and dry. No rashes or lesions. Extremities: No clubbing or cyanosis. No edema.   Data Reviewed: I have personally reviewed following labs and imaging studies  CBC: Recent Labs  Lab 07/03/20 1401 07/04/20 0428 07/05/20 0408 07/06/20 0556  WBC 4.7 3.1* 7.7 8.5  NEUTROABS  --  2.1 6.1 7.1  HGB 12.1* 11.4* 11.1* 11.5*  HCT 37.4* 37.1* 36.8* 35.7*  MCV 95.7 99.2 100.8* 94.7  PLT 100* 86* 111* 113*    Basic Metabolic Panel: Recent Labs  Lab 07/03/20 1401 07/04/20 0428 07/05/20 0408 07/05/20 1723 07/06/20 0556  NA 147* 148* 152* 153* 147*  K 3.7 3.0* 3.8 3.8 3.1*  CL 110 116* 121* 119* 116*  CO2  25 24 21* 23 20*  GLUCOSE 111* 186* 134* 191* 213*  BUN 55* 52* 60* 65* 63*  CREATININE 2.94* 2.17* 2.02* 1.91* 1.69*  CALCIUM 8.3* 8.2* 8.4* 8.8* 8.5*  MG  --   --   --   --  2.2  PHOS  --   --   --   --  1.3*    GFR: Estimated Creatinine Clearance: 26.8 mL/min (Jordan Ward) (by C-G formula based on SCr of 1.69 mg/dL (H)).  Liver Function Tests: Recent Labs  Lab 07/03/20 1401 07/04/20 0428 07/05/20 0408 07/06/20 0556  AST 69* 56* 44* 37  ALT 23 20 18 20   ALKPHOS 55 52 51 54  BILITOT 1.2 0.6 0.4 0.6  PROT 7.3 6.8 6.3* 6.3*  ALBUMIN 3.2* 2.9* 2.8* 2.8*    CBG: Recent Labs  Lab 07/05/20 1119 07/05/20 1633 07/05/20 2112 07/06/20 0735 07/06/20 1130  GLUCAP 267* 174* 233* 199* 222*     Recent Results (from the past 240 hour(s))  Novel Coronavirus, NAA ([facility])     Status: Abnormal   Collection Time: 06/29/20  5:57 PM   Specimen: Nasopharyngeal(NP) swabs in vial transport medium   Nasopharynge  Screenin  Result Value Ref Range Status   SARS-CoV-2, NAA Detected (Jordan Ward) Not Detected Final    Comment: Patients who have Anel Purohit positive COVID-19 test result may now have treatment options. Treatment options are available for patients with mild to moderate symptoms and for hospitalized patients. Visit our website at [email or web address] for resources and information. Jordan nucleic acid amplification test was developed and its performance characteristics determined by [employer]. Nucleic acid amplification tests include RT-PCR and TMA. Jordan test has not been FDA cleared or approved. Jordan test has been authorized by FDA under an Emergency Use Authorization (EUA). Jordan test is only authorized for the duration of time the declaration that circumstances exist justifying the authorization of the emergency use of in vitro diagnostic tests for detection of SARS-CoV-2 virus and/or diagnosis of COVID-19 infection under section 564(b)(1) of the Act, 21 U.S.C.  360bbb-3(b) (1),  unless the authorization is terminated or revoked sooner. When diagnostic testing is negativ e, the possibility of Jordan Ward false negative result should be considered in the context of Jordan Ward patient's recent exposures and the presence of clinical signs and symptoms consistent with COVID-19. An individual without symptoms of COVID-19 and who is not shedding SARS-CoV-2 virus would expect to have Hjalmar Ballengee negative (not detected) result in Jordan assay.   SARS-COV-2, NAA 2 DAY TAT     Status: None   Collection Time: 06/29/20  5:57 PM   Nasopharynge  Screenin  Result Value Ref Range Status   SARS-CoV-2, NAA 2 DAY TAT Performed  Final  Blood Culture (routine x 2)     Status: None (Preliminary result)   Collection Time: 07/03/20  2:01 PM   Specimen: BLOOD  Result Value Ref Range Status   Specimen Description   Final    BLOOD LEFT ANTECUBITAL Performed at [place] [facility] [address], [place], [place] [address]    Special Requests   Final    BOTTLES DRAWN AEROBIC AND ANAEROBIC Blood Culture results may not be optimal due to an inadequate volume of blood received in culture bottles Performed at [place] [address] [place], [place] [address]    Culture   Final    NO GROWTH 3 DAYS Performed at [facility], [place] [address] [place], [place] [address]    Report Status PENDING  Incomplete  Culture, Urine     Status: Abnormal   Collection Time: 07/04/20  7:56 PM   Specimen: Urine, Clean Catch  Result Value Ref Range Status   Specimen Description   Final    URINE, CLEAN CATCH Performed at [facility], [place] [address] [place], [place] [address]    Special Requests   Final    NONE Performed at [facility], [place] [address], [place], [place] [address]    Culture MULTIPLE SPECIES PRESENT, SUGGEST RECOLLECTION (Jordan Ward)  Final   Report Status 07/06/2020 FINAL  Final         Radiology Studies: US RENAL  Result Date:  07-13-20 CLINICAL DATA:  Acute kidney injury. EXAM: RENAL / URINARY TRACT ULTRASOUND COMPLETE COMPARISON:  CT abdomen pelvis 05/02/2020 FINDINGS: Right Kidney: Renal measurements: 6.7 x 3.2 x 3.4 cm = volume: 38 mL. Echogenicity within normal limits. There are two small cysts. No hydronephrosis. No shadowing calculi. Left Kidney: Renal measurements: 14.4 x 7.7 x 7.4 cm = volume: 431 mL. Echogenicity within normal limits. There is severe hydronephrosis. There is Makira Holleman shadowing calculus in the inferior pole measuring 1.1 cm Bladder: Decompressed with Foley in place. Other: None. IMPRESSION: 1. Atrophic right kidney with increased cortical echogenicity consistent with medical renal disease. Right renal cysts. 2.  Severe left hydronephrosis.  Left renal stone measuring 1.1 cm. Electronically Signed   By: Audie Pinto M.D.   On: 07-13-20 15:43   DG CHEST PORT 1 VIEW  Result Date: 07-13-2020 CLINICAL DATA:  Shortness of breath.  COVID. EXAM: PORTABLE CHEST 1 VIEW COMPARISON:  07/03/2020. 07/11/2017. No pleural effusion or pneumothorax. FINDINGS: Mediastinum hilar structures normal. Mild infiltrate is noted in the right upper lung and right mid lung. Chronic interstitial changes again noted. Heart size normal. No acute bony abnormality. IMPRESSION: Mild infiltrate right upper lung and right mid lung. Chronic interstitial lung disease. Electronically Signed   By: [place]   On: Jul 13, 2020 06:30   DG Abd Portable 1V  Result Date: 07/05/2020 CLINICAL DATA:  79 year old male with left-sided hydronephrosis. EXAM: PORTABLE ABDOMEN - 1 VIEW COMPARISON:  CT abdomen pelvis dated 05/02/2020. FINDINGS: Left pigtail ureteral stent. The previously seen 1 cm stone in the inferior pole of the left kidney is not visualized on Jordan study. Linear calcific densities over the left renal silhouette, likely vascular calcification. There is no bowel dilatation or evidence of obstruction. No free air. Right upper quadrant  cholecystectomy clips. Osteopenia with degenerative changes of the spine. No acute osseous pathology. IMPRESSION: Left pigtail ureteral stent.  No stone identified. Electronically Signed   By: Anner Crete M.D.   On: 07/05/2020 21:16   VAS [place] LOWER EXTREMITY VENOUS (DVT)  Result Date: 07/05/2020  Lower Venous DVT Study Indications: Covid-19, elevated D-Dimer.  Comparison Study: No prior study Performing Technologist: Sharion Dove RVS  Examination Guidelines: Vinicio Lynk complete evaluation includes B-mode imaging, spectral Doppler, color Doppler, and power Doppler as needed of all accessible portions of each vessel. Bilateral testing is considered an integral part of Lynnann Knudsen complete examination. Limited examinations for reoccurring indications may be performed as noted. The reflux portion of the exam is performed with the patient in reverse Trendelenburg.  +---------+---------------+---------+-----------+----------+--------------+ RIGHT    CompressibilityPhasicitySpontaneityPropertiesThrombus Aging +---------+---------------+---------+-----------+----------+--------------+ CFV      Full           Yes      Yes                                 +---------+---------------+---------+-----------+----------+--------------+ SFJ      Full                                                        +---------+---------------+---------+-----------+----------+--------------+ FV Prox  Full                                                        +---------+---------------+---------+-----------+----------+--------------+ FV Mid   Full                                                        +---------+---------------+---------+-----------+----------+--------------+ FV DistalFull                                                        +---------+---------------+---------+-----------+----------+--------------+ PFV      Full                                                         +---------+---------------+---------+-----------+----------+--------------+ POP      Full           Yes      Yes                                 +---------+---------------+---------+-----------+----------+--------------+  PTV      Full                                                        +---------+---------------+---------+-----------+----------+--------------+ PERO     Full                                                        +---------+---------------+---------+-----------+----------+--------------+   +---------+---------------+---------+-----------+----------+--------------+ LEFT     CompressibilityPhasicitySpontaneityPropertiesThrombus Aging +---------+---------------+---------+-----------+----------+--------------+ CFV      Full           Yes      Yes                                 +---------+---------------+---------+-----------+----------+--------------+ SFJ      Full                                                        +---------+---------------+---------+-----------+----------+--------------+ FV Prox  Full                                                        +---------+---------------+---------+-----------+----------+--------------+ FV Mid   Full                                                        +---------+---------------+---------+-----------+----------+--------------+ FV DistalFull                                                        +---------+---------------+---------+-----------+----------+--------------+ PFV      Full                                                        +---------+---------------+---------+-----------+----------+--------------+ POP      Full           Yes      Yes                                 +---------+---------------+---------+-----------+----------+--------------+ PTV      Full                                                         +---------+---------------+---------+-----------+----------+--------------+  PERO     Full                                                        +---------+---------------+---------+-----------+----------+--------------+     Summary: BILATERAL: - No evidence of deep vein thrombosis seen in the lower extremities, bilaterally. -   *See table(s) above for measurements and observations. Electronically signed by Monica Martinez MD on 07/05/2020 at 3:25:56 PM.    Final         Scheduled Meds: . vitamin C  500 mg Oral BID  . baricitinib  2 mg Oral Daily  . feeding supplement (ENSURE ENLIVE)  237 mL Oral TID BM  . heparin  5,000 Units Subcutaneous Q8H  . HYDROcodone-acetaminophen  1 tablet Oral BID  . insulin aspart  0-5 Units Subcutaneous QHS  . insulin aspart  0-9 Units Subcutaneous TID WC  . insulin aspart  2 Units Subcutaneous TID WC  . Ipratropium-Albuterol  1 puff Inhalation BID  . levothyroxine  75 mcg Oral QAC breakfast  . linagliptin  5 mg Oral Daily  . multivitamin with minerals  1 tablet Oral Daily  . phosphorus  500 mg Oral QID  . [START ON 07/07/2020] predniSONE  50 mg Oral Daily  . sodium chloride flush  3 mL Intravenous Q12H   Continuous Infusions: . cefTRIAXone (ROCEPHIN)  IV 1 g (07/06/20 1101)  . dextrose 50 mL/hr at 07/06/20 0829  . remdesivir 100 mg in NS 100 mL 100 mg (07/06/20 0949)     LOS: 3 days    Time spent: over 20 min    Fayrene Helper, MD [facility]   To contact the attending provider between 7A-7P or the covering provider during after hours 7P-7A, please log into the web site [email or web address] and access using universal [place] password for that web site. If you do not have the password, please call the hospital operator.  07/06/2020, 12:36 PM

## 2020-07-06 NOTE — Progress Notes (Addendum)
Physical Therapy Treatment Patient Details Name: Jordan Ward MRN: [id number] DOB: 12/12/1940 Today's Date: 07/06/2020    History of Present Illness 79 yo male admitted with COVID. Hx of MS, HF, CKD, CVA with L sided weakness, CAD, gait d/o, PVD, neurogenic bladder    PT Comments    The patient is slow to respond. patient did mobilize and ambulate x 30' using Rw and min steady assistance. Patient SPO2 87% on 2 L While ambulating. Placed patient on 3 L [place] with SPO2 89%.   Patient is very weak. Plans for Dc home if he has assistance.Patient reports wife and daughter. Noted some leakage from SP site. RN notified.  Follow Up Recommendations  Home health PT;Supervision/Assistance - 24 hour     Equipment Recommendations  None recommended by PT    Recommendations for Other Services       Precautions / Restrictions Precautions Precautions: Fall Precaution Comments: monitor O2, suprpubic cath leaking    Mobility  Bed Mobility   Bed Mobility: Supine to Sit     Supine to sit: Min guard     General bed mobility comments: patient require increased time  Transfers Overall transfer level: Needs assistance Equipment used: Rolling walker (2 wheeled) Transfers: Sit to/from Stand Sit to Stand: Min assist         General transfer comment: patient require assist to power up to standing and for stability at RW  Ambulation/Gait Ambulation/Gait assistance: Min assist Gait Distance (Feet): 30 Feet Assistive device: Rolling walker (2 wheeled) Gait Pattern/deviations: Step-through pattern;Decreased stride length Gait velocity: decr   General Gait Details: Assist to stabilize throughout distance. O2 87% on 2L. Dyspnea 2/4.   Stairs             Wheelchair Mobility    Modified Rankin (Stroke Patients Only)       Balance Overall balance assessment: Needs assistance Sitting-balance support: Feet supported Sitting balance-Leahy Scale: Fair     Standing balance  support: Bilateral upper extremity supported;During functional activity Standing balance-Leahy Scale: Poor                              Cognition Arousal/Alertness: Awake/alert Behavior During Therapy: Flat affect Overall Cognitive Status: No family/caregiver present to determine baseline cognitive functioning Area of Impairment: Orientation;Problem solving                 Orientation Level: Disoriented to;Time;Place           Problem Solving: Slow processing;Requires verbal cues General Comments: very slow to respond      Exercises      General Comments        Pertinent Vitals/Pain Pain Assessment: Faces Faces Pain Scale: Hurts even more Pain Location: at suprpubic site Pain Descriptors / Indicators: Cramping;Stabbing Pain Intervention(s): Monitored during session;Repositioned    Home Living                      Prior Function            PT Goals (current goals can now be found in the care plan section) Progress towards PT goals: Progressing toward goals    Frequency    Min 3X/week      PT Plan Current plan remains appropriate    Co-evaluation              AM-PAC PT "6 Clicks" Mobility   Outcome Measure  Help needed turning from  your back to your side while in a flat bed without using bedrails?: A Little Help needed moving from lying on your back to sitting on the side of a flat bed without using bedrails?: A Little Help needed moving to and from a bed to a chair (including a wheelchair)?: A Little Help needed standing up from a chair using your arms (e.g., wheelchair or bedside chair)?: A Little Help needed to walk in hospital room?: A Lot Help needed climbing 3-5 steps with a railing? : A Lot 6 Click Score: 16    End of Session Equipment Utilized During Treatment: Gait belt;Oxygen Activity Tolerance: Patient limited by fatigue;Patient tolerated treatment well Patient left: in chair;with call bell/phone within  reach;with chair alarm set Nurse Communication: Mobility status PT Visit Diagnosis: Muscle weakness (generalized) (M62.81);History of falling (Z91.81);Difficulty in walking, not elsewhere classified (R26.2)     Time: 0258-5277 PT Time Calculation (min) (ACUTE ONLY): 24 min  Charges:  $Gait Training: 23-37 mins                     [place] Pager [phone] Office [phone]    [name] 07/06/2020, 1:45 PM

## 2020-07-06 NOTE — Care Management Important Message (Signed)
Important Message  Patient Details IM Letter given to the Patient Name: Jordan Ward MRN: [id number] Date of Birth: Aug 20, 1941   Medicare Important Message Given:  Yes     Kerin Salen 07/06/2020, 10:52 AM

## 2020-07-06 NOTE — Progress Notes (Signed)
Inpatient Diabetes Program Recommendations  AACE/ADA: New Consensus Statement on Inpatient Glycemic Control (2015)  Target Ranges:  Prepandial:   less than 140 mg/dL      Peak postprandial:   less than 180 mg/dL (1-2 hours)      Critically ill patients:  140 - 180 mg/dL   Lab Results  Component Value Date   GLUCAP 267 (H) 07/05/2020   HGBA1C 6.3 (H) 07/04/2020    Review of Glycemic Control  Diabetes history: none A1c 6.3%  Current orders for Inpatient glycemic control:  Novolog 0-9 units tid + hs  Solumedrol 25.2 mg q12 hours PO prednisone 50 mg Daily scheduled to start 10/9 Ensure Enlive tid between meals  Inpatient Diabetes Program Recommendations:    -  Add Tradjenta 5 mg Daily -  Consider Novolog 2 units tid meal coverage with parameters (glucose at least 80 mg/dl, pt eats at least 50% of meals)  Thanks,  Tama Headings RN, MSN, BC-ADM Inpatient Diabetes Coordinator Team Pager [phone] (8a-5p)

## 2020-07-07 DIAGNOSIS — U071 COVID-19: Secondary | ICD-10-CM | POA: Diagnosis not present

## 2020-07-07 DIAGNOSIS — J9601 Acute respiratory failure with hypoxia: Secondary | ICD-10-CM | POA: Diagnosis not present

## 2020-07-07 LAB — CBC WITH DIFFERENTIAL/PLATELET
Abs Immature Granulocytes: 0.08 10*3/uL — ABNORMAL HIGH (ref 0.00–0.07)
Basophils Absolute: 0 10*3/uL (ref 0.0–0.1)
Basophils Relative: 0 %
Eosinophils Absolute: 0 10*3/uL (ref 0.0–0.5)
Eosinophils Relative: 0 %
HCT: 34.7 % — ABNORMAL LOW (ref 39.0–52.0)
Hemoglobin: 11.1 g/dL — ABNORMAL LOW (ref 13.0–17.0)
Immature Granulocytes: 1 %
Lymphocytes Relative: 9 %
Lymphs Abs: 0.7 10*3/uL (ref 0.7–4.0)
MCH: 30.4 pg (ref 26.0–34.0)
MCHC: 32 g/dL (ref 30.0–36.0)
MCV: 95.1 fL (ref 80.0–100.0)
Monocytes Absolute: 0.4 10*3/uL (ref 0.1–1.0)
Monocytes Relative: 5 %
Neutro Abs: 6.9 10*3/uL (ref 1.7–7.7)
Neutrophils Relative %: 85 %
Platelets: 122 10*3/uL — ABNORMAL LOW (ref 150–400)
RBC: 3.65 MIL/uL — ABNORMAL LOW (ref 4.22–5.81)
RDW: 14.1 % (ref 11.5–15.5)
WBC: 8.1 10*3/uL (ref 4.0–10.5)
nRBC: 0 % (ref 0.0–0.2)

## 2020-07-07 LAB — C-REACTIVE PROTEIN: CRP: 3.1 mg/dL — ABNORMAL HIGH (ref <1.0)

## 2020-07-07 LAB — FERRITIN: Ferritin: 588 ng/mL — ABNORMAL HIGH (ref 24–336)

## 2020-07-07 LAB — COMPREHENSIVE METABOLIC PANEL
ALT: 28 U/L (ref 0–44)
AST: 42 U/L — ABNORMAL HIGH (ref 15–41)
Albumin: 2.8 g/dL — ABNORMAL LOW (ref 3.5–5.0)
Alkaline Phosphatase: 60 U/L (ref 38–126)
Anion gap: 13 (ref 5–15)
BUN: 59 mg/dL — ABNORMAL HIGH (ref 8–23)
CO2: 26 mmol/L (ref 22–32)
Calcium: 8.7 mg/dL — ABNORMAL LOW (ref 8.9–10.3)
Chloride: 114 mmol/L — ABNORMAL HIGH (ref 98–111)
Creatinine, Ser: 1.6 mg/dL — ABNORMAL HIGH (ref 0.61–1.24)
GFR, Estimated: 41 mL/min — ABNORMAL LOW (ref >60)
Glucose, Bld: 189 mg/dL — ABNORMAL HIGH (ref 70–99)
Potassium: 3.2 mmol/L — ABNORMAL LOW (ref 3.5–5.1)
Sodium: 153 mmol/L — ABNORMAL HIGH (ref 135–145)
Total Bilirubin: 0.7 mg/dL (ref 0.3–1.2)
Total Protein: 6.3 g/dL — ABNORMAL LOW (ref 6.5–8.1)

## 2020-07-07 LAB — D-DIMER, QUANTITATIVE: D-Dimer, Quant: 1.65 ug/mL-FEU — ABNORMAL HIGH (ref 0.00–0.50)

## 2020-07-07 LAB — PHOSPHORUS: Phosphorus: 2.1 mg/dL — ABNORMAL LOW (ref 2.5–4.6)

## 2020-07-07 LAB — OSMOLALITY, URINE: Osmolality, Ur: 438 mOsm/kg (ref 300–900)

## 2020-07-07 MED ORDER — POTASSIUM CHLORIDE CRYS ER 20 MEQ PO TBCR
40.0000 meq | EXTENDED_RELEASE_TABLET | ORAL | Status: AC
  Administered 2020-07-07 (×2): 40 meq via ORAL
  Filled 2020-07-07 (×2): qty 2

## 2020-07-07 NOTE — Progress Notes (Signed)
Occupational Therapy Treatment Patient Details Name: Jordan Ward MRN: [id number] DOB: Jul 01, 1941 Today's Date: 07/07/2020    History of present illness 79 yo male admitted with [place]. Hx of MS, HF, CKD, CVA with L sided weakness, CAD, gait d/o, PVD, neurogenic bladder   OT comments  Patient presents supine in bed on 3L [place]. Patient's continuous pulse ox exhibited poor signal due to patient's hand being tremulous and ear sat probe used to monitor vitals. o2 sat 91% and HR 73. Patient min guard to transfer to side of bed with increased time. Patient sat edge of bed several minutes to prepare for standing. Patient min assist to stand from bed height and min guard to stand pivot to recliner. Patient reported his left leg is weak and with a small initial loss of balance on standing. Patient's o2 sat dropped to 87% after transfer to chair. Patient performed grooming task seated in chair with increased time needed. Patient encouraged to use breathing device and he reports compliance and demonstrated use for therapist. Patient left in chair with sat maintaining between 92-93%.    Follow Up Recommendations  Home health OT;Supervision/Assistance - 24 hour    Equipment Recommendations  None recommended by OT    Recommendations for Other Services      Precautions / Restrictions Precautions Precautions: Fall Precaution Comments: monitor O2, suprpubic cath leaking Restrictions Weight Bearing Restrictions: No       Mobility Bed Mobility Overal bed mobility: Needs Assistance Bed Mobility: Supine to Sit     Supine to sit: Min guard;HOB elevated     General bed mobility comments: patient require increased time  Transfers Overall transfer level: Needs assistance Equipment used: Rolling walker (2 wheeled) Transfers: Sit to/from Stand Sit to Stand: Min assist Stand pivot transfers: Min guard       General transfer comment: Min assist to stand. Min guard to take steps to recliner.     Balance Overall balance assessment: Needs assistance Sitting-balance support: No upper extremity supported;Feet supported Sitting balance-Leahy Scale: Fair     Standing balance support: Bilateral upper extremity supported;During functional activity Standing balance-Leahy Scale: Poor                             ADL either performed or assessed with clinical judgement   ADL Overall ADL's : Needs assistance/impaired     Grooming: Oral care;Wash/dry face;Set up;Sitting Grooming Details (indicate cue type and reason): patient performed grooming task seated in recliner. Needed increased time to perfor task well.                                     Vision Patient Visual Report: No change from baseline     Perception     Praxis      Cognition Arousal/Alertness: Awake/alert Behavior During Therapy: WFL for tasks assessed/performed Overall Cognitive Status: Within Functional Limits for tasks assessed                                          Exercises     Shoulder Instructions       General Comments      Pertinent Vitals/ Pain       Pain Assessment: 0-10 Faces Pain Scale: Hurts whole lot Pain Location: stomach Pain  Descriptors / Indicators: Grimacing Pain Intervention(s): Monitored during session;Limited activity within patient's tolerance  Home Living                                          Prior Functioning/Environment              Frequency  Min 2X/week        Progress Toward Goals  OT Goals(current goals can now be found in the care plan section)  Progress towards OT goals: Progressing toward goals  Acute Rehab OT Goals Patient Stated Goal: home OT Goal Formulation: With patient Time For Goal Achievement: 07/19/20 Potential to Achieve Goals: Good  Plan Discharge plan remains appropriate    Co-evaluation                 AM-PAC OT "6 Clicks" Daily Activity     Outcome  Measure   Help from another person eating meals?: None Help from another person taking care of personal grooming?: A Little Help from another person toileting, which includes using toliet, bedpan, or urinal?: A Little Help from another person bathing (including washing, rinsing, drying)?: A Little Help from another person to put on and taking off regular upper body clothing?: A Little Help from another person to put on and taking off regular lower body clothing?: A Little 6 Click Score: 19    End of Session Equipment Utilized During Treatment: Oxygen;Rolling walker  OT Visit Diagnosis: Unsteadiness on feet (R26.81);Other abnormalities of gait and mobility (R26.89)   Activity Tolerance Patient limited by fatigue   Patient Left in chair;with call bell/phone within reach;with chair alarm set   Nurse Communication Mobility status        Time: 1000-1024 OT Time Calculation (min): 24 min  Charges: OT General Charges $OT Visit: 1 Visit OT Treatments $Self Care/Home Management : 8-22 mins $Therapeutic Activity: 8-22 mins  Derl Barrow, OTR/L [place]  Office [phone] Pager: [place] 07/07/2020, 2:09 PM

## 2020-07-07 NOTE — Progress Notes (Signed)
Reviewed chart.  Creatinine downtrending nicely.  Creatinine 1.6 today from 1.69 yesterday.  No need for RBUS or PCN presently.  Following along.  Matt R. [facility]  Pager: [phone]

## 2020-07-07 NOTE — Progress Notes (Signed)
  Speech Language Pathology Treatment: Dysphagia  Patient Details Name: Jordan Ward MRN: [id number] DOB: 08/14/1941 Today's Date: 07/07/2020 Time: 2703-5009 SLP Time Calculation (min) (ACUTE ONLY): 40 min  Assessment / Plan / Recommendation Clinical Impression  Second separate session for sole purpose of initiation of EMT (Expiratory Muscle Strength Training) commenced to help pt with cough strength, submental musculature contraction thus improving airway clearance, pharyngeal and laryngeal musculature contraction to decrease retention and aspiration.  Introduced pt to [employer] using pt's COVID status, oxygen saturation and his subjective effort to set parameters.  Pt benefited from verbal and visual cues at moderate level initially to perform adequately and allow adequate rest breaks between repetitions.  Trainer set at level 8 with pt fading from moderate cues to mod I by end of session.     Wrote instructons out for pt to conducted exercises BEFORE eating and 10 repetitions - TID.  Using teach back and return demonstration, education ongoing.     Pt performed well today but continues to report "feeling bad".  He does appear motivated to get better and demonstrated the IS after completion of EMT!    HPI HPI: 79 yo with complex med hx including MS and chronic dysphagia dating back to at least 2013 - stroke hx with left sided weakness admitted with COVID.   Prior MBS studies for dysphagia have shown asp of thin, thickened liquids 2013 and 2016 - 09/2014-07/2015 after which he stopped using per notes.  Pt advised he does not like thick liquids to prior SLP.  He also has a  Zenker's - seen on prior MBS.  Pt found to have RU and RM lobe pna CXR.  Swallow eval ordered. Upon entrance to room, pt presents with a large amount of meat retained in his oral cavity though he had finished his meal.      SLP Plan  Continue with current plan of care        Recommendations  Diet recommendations: Dysphagia 3 (mechanical soft);Nectar-thick liquid Liquids provided via: Cup;Straw;Teaspoon Medication Administration: Whole meds with puree Supervision: Staff to assist with self feeding (pt can give himself ice but needs assist with meals) Compensations: Slow rate;Small sips/bites;Other (Comment) Postural Changes and/or Swallow Maneuvers: Seated upright 90 degrees;Upright 30-60 min after meal                Oral Care Recommendations: Oral care BID Follow up Recommendations: Home health SLP SLP Visit Diagnosis: Dysphagia, oropharyngeal phase (R13.12);Dysphagia, pharyngoesophageal phase (R13.14) Plan: Continue with current plan of care       GO                Macario Golds 07/07/2020, 4:56 PM  Kathleen Lime, MS [facility] SLP Acute Rehab Services Office [phone] Pager [phone]

## 2020-07-07 NOTE — Progress Notes (Signed)
Speech Language Pathology Treatment: Dysphagia  Patient Details Name: Jordan Ward MRN: [id number] DOB: [birth year] Today's Date: 07/07/2020 Time: 6203-5597 SLP Time Calculation (min) (ACUTE ONLY): 35 min  Assessment / Plan / Recommendation Clinical Impression  Pt seen today for dysphagia management/treatment to assure po tolerance given h/o dysphagia from his MS with likely current exacerbation due to his COVID status.  Pt was sitting partially upright in chair- obtained assistance to reposition him for optimal position for po/treatment.  Pt reports poor intake as he states he is tired of tater tots.  Thus SLP decided to obtain menu and assist pt to select and order items for dinner tonight, and 3 meals tomorrow. His processing of information is very delayed as well as are his responses - uncertain if due to COVID and/or MS.  At times he requires repetition of questions.    Pt willing to consume intake of Ensure, thickened water, thin water via tsp and ice.  Immediate cough post-swallow of thin water via tsp noted - likely due to aspiration with known hx of Zenker's and dysphagia due to MD.   No indication of aspiration with small single ice chips as pt allows time for ice chips to melt before he swallows.  And no cough response when drinking nectar thickened water but he strongly dislikes it, stating "that's like molasses".     Fortunately pt will consume Ensure, thus advised he consume them with meals as his "thick drink" to which he agreed.  Again reviewed clinical reasoning for dietary limitations and compensations, especially in light of pt having COVID.    He agrees to continue modified die to mitigate risk.  Pt appears a bit stronger as he is able to feed himself the ice chips and hold his drinks.  Continue current diet with strict precautions recommended and plan MBS before his discharge home to allow instrumental evaluation given h/o sensorimotor impairments and Zenker's.  Pt states his wife  advised him to have swallowing test completed.  Will follow.     HPI HPI: 79 yo with complex med hx including MS and chronic dysphagia dating back to at least 2013 - stroke hx with left sided weakness admitted with COVID.   Prior MBS studies for dysphagia have shown asp of thin, thickened liquids 2013 and 2016 - 09/2014-07/2015 after which he stopped using per notes.  Pt advised he does not like thick liquids to prior SLP.  He also has a  Zenker's - seen on prior MBS.  Pt found to have RU and RM lobe pna CXR.  Swallow eval ordered. Upon entrance to room, pt presents with a large amount of meat retained in his oral cavity though he had finished his meal.      SLP Plan  Continue with current plan of care       Recommendations  Diet recommendations: Dysphagia 3 (mechanical soft);Nectar-thick liquid (tsps water and ice ok any time), ice cream jello, and Ensure ok Liquids provided via: Cup;Straw;Teaspoon Medication Administration: Whole meds with puree Supervision: Staff to assist with self feeding (pt can give himself ice but needs assist with meals) Compensations: Slow rate;Small sips/bites;Other (Comment) Postural Changes and/or Swallow Maneuvers: Seated upright 90 degrees;Upright 30-60 min after meal                SLP Visit Diagnosis: Dysphagia, oropharyngeal phase (R13.12);Dysphagia, pharyngoesophageal phase (R13.14) Plan: Continue with current plan of care       GO  Macario Golds 07/07/2020, 4:33 PM

## 2020-07-07 NOTE — Progress Notes (Signed)
PROGRESS NOTE    Jordan Ward  BOF:[id number] DOB: [birth year] DOA: 07/03/2020 PCP: Noreene Larsson, NP   Chief Complaint  Patient presents with  . Weakness    Brief Narrative:  Per previous HPI Patient is Jordan Ward poor historian and much of the history is obtained from prior notes  This is Jordan Ward 79 year old male who has not been vaccinated against COVID-19 with Jordan Ward complex medical history including MS with suprapubic catheter and recurrent Proteus UTIs, HFpEF, recent left ureteroscopy with stone extraction and stent placement on 8/24, CKD 3b, vascular disease, hypertension, stroke with left-sided weakness who presented today to the ED from the monoclonal antibody infusion center with acute hypoxic respiratory failure.  He was diagnosed with COVID-19 on October 1 but is unsure of when his symptoms first started but started with Jordan Ward mild cough.  Wife is Jordan Ward positive sick contact.  He was referred to the infusion center and was noted to be desaturating to the 70s on room air and was transferred to the ED initially requiring 7 LPM to maintain sats greater than 90%.  No fever, chills, nausea, vomiting, diarrhea, rash.  ED Course: Afebrile, bradycardic (58), hypotensive (124/75 -> 89/61 ->95/53) requiring 6 LPM.  Notable labs: Sodium 147, glucose 111, BUN 55, creatinine 2.94 (previously 1.74 on 8/9, baseline 1.3), LDH, ferritin 804, CRP 12.4, procalcitonin negative, lactic acid 1.3, WBC 4.7, Hb 12, platelets 100, D-dimer 2.4, fibrinogen 431, COVID-19 positive.  CXR with RML and RLL ill-defined opacity.  Assessment & Plan:   Principal Problem:   Acute hypoxemic respiratory failure due to COVID-19 [facility]) Active Problems:   Multiple sclerosis (HCC)   Hypothyroidism   Chronic diastolic (congestive) heart failure (HCC)   Essential hypertension   Cerebrovascular disease   Hypotension   Acute kidney injury superimposed on CKD ([place])  1. Acute hypoxic respiratory failure secondary to COVID-19  Possibly developing  COVID encephalopathy Jordan Ward. Unvaccinated b. CXR with ill defined opacity R mid lung and R base, likely representing atypical organism pneumonia c. Follow repeat CXR 10/7 with mild infiltrate R upper lung and R mid lung, chronic interstitial lung disease d. Currently requiring 3 Jordan Ward [place] (on 6 Jordan Ward at presentation) - wean as tolerated, stable today e. Procalcitonin negative - hold off antibiotics for now f. Remdesivir x 5 days, Steroids x 10 days, baricitinib (renal dosing) g. Strict I/O, daily weights h. Encourage Incentive Spirometry, Flutter Valve and Pronation as tolerated.  OOB.  Therapy.  COVID-19 Labs  Recent Labs    07/05/20 0408 07/06/20 0556 07/07/20 0300  DDIMER 1.48* 1.34* 1.65*  FERRITIN 865* 903* 588*  CRP 7.1* 4.2* 3.1*    Lab Results  Component Value Date   SARSCOV2NAA Detected ([place]) 06/29/2020   [place] NEGATIVE 05/21/2020   [place] NEGATIVE 05/02/2020   [place] NEGATIVE 04/18/2020   2. Elevated D dimer 1. Likely 2/2 covid 19 infection - will follow LE [place] (negative for DVT) 2. Trend, consider additional imaging as needed  3. Hypernatremia: requiring D5w, follow urine osms  4. Multiple sclerosis with neurogenic bladder and suprapubic catheter  Jordan Ward. Not on any Biologics or immunomodulators b. Continue home pain regimen: Norco and Valium c. PT  3. Compensated HFpEF Jordan Ward. Holding home Coreg due to hypotension  4. AKI on CKD 3b  Left Sided Hydronephrosis Jordan Ward. Baseline creatinine 1.3-1.5, peaked at 2.94 b. Improving today, follow with IVF c. Follow UA -> appears c/w UTI (see below) d. Follow renal US -> left sided hydro, discussed with urology who will  eval -> recommending outpatient follow up and suppressive antibiotics when ready for discharge (discussed with Dr. Junious Silk)  5. Asymptomatic Bacteruria  Suprapubic Catheter 1. UA with +nitrite and LE, hematuria  2. He's asymptomatic from this, will follow urine cx 3. Continue ceftriaxone (in setting of hydro  and treatment with steroids/baricitinib), follow final cx (multiple species)  6. Hypertension now with hypotension Jordan Ward. BP improved b. Hold home antihypertensives (coreg, amlodipine)  6. Hypothyroidism Jordan Ward. Continue home Synthroid  7. History of stroke with left-sided weakness  8. Hypokalemia  Hypophosphatemia: replace and follow  9. Prediabetes  Hyperglycemia 2/2 Steroids: follow A1c (6.3), SSI  10. Thrombocytopenia: mild, some degree of chronicity  DVT prophylaxis: heparin Code Status: full  Family Communication: none at bedside - 10/9 wife Disposition:   Status is: Inpatient  Remains inpatient appropriate because:Inpatient level of care appropriate due to severity of illness   Dispo: The patient is from: Home              Anticipated d/c is to: pending              Anticipated d/c date is: > 3 days              Patient currently is not medically stable to d/c.       Consultants:   none  Procedures:   none  Antimicrobials:  Anti-infectives (From admission, onward)   Start     Dose/Rate Route Frequency Ordered Stop   07/06/20 1000  cefTRIAXone (ROCEPHIN) 1 g in sodium chloride 0.9 % 100 mL IVPB        1 g 200 mL/hr over 30 Minutes Intravenous Every 24 hours 07/06/20 0904     07/04/20 1000  remdesivir 100 mg in sodium chloride 0.9 % 100 mL IVPB  Status:  Discontinued       "Followed by" Linked Group Details   100 mg 200 mL/hr over 30 Minutes Intravenous Every 24 hours 07/03/20 1559 07/03/20 1845   07/04/20 1000  remdesivir 100 mg in sodium chloride 0.9 % 100 mL IVPB  Status:  Discontinued       "Followed by" Linked Group Details   100 mg 200 mL/hr over 30 Minutes Intravenous Daily 07/03/20 1845 07/03/20 1905   07/04/20 1000  remdesivir 100 mg in sodium chloride 0.9 % 100 mL IVPB        100 mg 200 mL/hr over 30 Minutes Intravenous Every 24 hours 07/03/20 2008 07/07/20 1008   07/03/20 1845  remdesivir 200 mg in sodium chloride 0.9% 250 mL IVPB  Status:   Discontinued       "Followed by" Linked Group Details   200 mg 580 mL/hr over 30 Minutes Intravenous Once 07/03/20 1845 07/03/20 1905   07/03/20 1700  remdesivir 200 mg in sodium chloride 0.9% 250 mL IVPB       "Followed by" Linked Group Details   200 mg 580 mL/hr over 30 Minutes Intravenous Once 07/03/20 1559 07/03/20 1800      Subjective: Continues to feel poorly  Objective: Vitals:   07/06/20 2030 07/07/20 0500 07/07/20 0513 07/07/20 1324  BP: 140/67  137/75 117/62  Pulse: 65  61 65  Resp: 20  20 16   Temp: 98.2 F (36.8 C)  97.9 F (36.6 C) 98.6 F (37 C)  TempSrc:      SpO2: 95%  99% 94%  Weight:  51.7 kg    Height:        Intake/Output Summary (Last 24 hours) at  07/07/2020 1639 Last data filed at 07/07/2020 1400 Gross per 24 hour  Intake 910 ml  Output 2600 ml  Net -1690 ml   Filed Weights   07/05/20 0500 07/06/20 0500 07/07/20 0500  Weight: 52.7 kg 51.7 kg 51.7 kg    Examination:  General: No acute distress. Frail, chronically ill appearing Cardiovascular: Heart sounds show Jordan Ward regular rate, and rhythm Lungs: Clear to auscultation bilaterally with good air movement. No rales, rhonchi or wheezes. Abdomen: Soft, nontender, nondistended Neurological: Alert and oriented 3. Moves all extremities 4. Cranial nerves II through XII grossly intact. Skin: Warm and dry. No rashes or lesions. Extremities: No clubbing or cyanosis. No edema.   Data Reviewed: I have personally reviewed following labs and imaging studies  CBC: Recent Labs  Lab 07/03/20 1401 07/04/20 0428 07/05/20 0408 07/06/20 0556 07/07/20 0300  WBC 4.7 3.1* 7.7 8.5 8.1  NEUTROABS  --  2.1 6.1 7.1 6.9  HGB 12.1* 11.4* 11.1* 11.5* 11.1*  HCT 37.4* 37.1* 36.8* 35.7* 34.7*  MCV 95.7 99.2 100.8* 94.7 95.1  PLT 100* 86* 111* 113* 122*    Basic Metabolic Panel: Recent Labs  Lab 07/04/20 0428 07/05/20 0408 07/05/20 1723 07/06/20 0556 07/07/20 0300  NA 148* 152* 153* 147* 153*  K 3.0* 3.8  3.8 3.1* 3.2*  CL 116* 121* 119* 116* 114*  CO2 24 21* 23 20* 26  GLUCOSE 186* 134* 191* 213* 189*  BUN 52* 60* 65* 63* 59*  CREATININE 2.17* 2.02* 1.91* 1.69* 1.60*  CALCIUM 8.2* 8.4* 8.8* 8.5* 8.7*  MG  --   --   --  2.2  --   PHOS  --   --   --  1.3*  --     GFR: Estimated Creatinine Clearance: 28.3 mL/min (Jordan Ward) (by C-G formula based on SCr of 1.6 mg/dL (H)).  Liver Function Tests: Recent Labs  Lab 07/03/20 1401 07/04/20 0428 07/05/20 0408 07/06/20 0556 07/07/20 0300  AST 69* 56* 44* 37 42*  ALT 23 20 18 20 28   ALKPHOS 55 52 51 54 60  BILITOT 1.2 0.6 0.4 0.6 0.7  PROT 7.3 6.8 6.3* 6.3* 6.3*  ALBUMIN 3.2* 2.9* 2.8* 2.8* 2.8*    CBG: Recent Labs  Lab 07/05/20 1119 07/05/20 1633 07/05/20 2112 07/06/20 0735 07/06/20 1130  GLUCAP 267* 174* 233* 199* 222*     Recent Results (from the past 240 hour(s))  Novel Coronavirus, NAA ([facility])     Status: Abnormal   Collection Time: 06/29/20  5:57 PM   Specimen: Nasopharyngeal(NP) swabs in vial transport medium   Nasopharynge  Screenin  Result Value Ref Range Status   SARS-CoV-2, NAA Detected ([place]) Not Detected Final    Comment: Patients who have Jamille Yoshino positive COVID-19 test result may now have treatment options. Treatment options are available for patients with mild to moderate symptoms and for hospitalized patients. Visit our website at [email or web address] for resources and information. This nucleic acid amplification test was developed and its performance characteristics determined by [employer]. Nucleic acid amplification tests include RT-PCR and TMA. This test has not been FDA cleared or approved. This test has been authorized by FDA under an Emergency Use Authorization (EUA). This test is only authorized for the duration of time the declaration that circumstances exist justifying the authorization of the emergency use of in vitro diagnostic tests for detection of SARS-CoV-2 virus and/or  diagnosis of COVID-19 infection under section 564(b)(1) of the Act, 21 U.S.C. 941DEY-8(X) (1), unless the authorization is terminated or revoked  sooner. When diagnostic testing is negativ e, the possibility of Jordan Ward false negative result should be considered in the context of Jordan Ward patient's recent exposures and the presence of clinical signs and symptoms consistent with COVID-19. An individual without symptoms of COVID-19 and who is not shedding SARS-CoV-2 virus would expect to have Lonie Rummell negative (not detected) result in this assay.   SARS-COV-2, NAA 2 DAY TAT     Status: None   Collection Time: 06/29/20  5:57 PM   Nasopharynge  Screenin  Result Value Ref Range Status   SARS-CoV-2, NAA 2 DAY TAT Performed  Final  Blood Culture (routine x 2)     Status: None (Preliminary result)   Collection Time: 07/03/20  2:01 PM   Specimen: BLOOD  Result Value Ref Range Status   Specimen Description   Final    BLOOD LEFT ANTECUBITAL Performed at [facility] [address] [place], [place] [address]    Special Requests   Final    BOTTLES DRAWN AEROBIC AND ANAEROBIC Blood Culture results may not be optimal due to an inadequate volume of blood received in culture bottles Performed at [facility] [address], [place], [place] [address]    Culture   Final    NO GROWTH 4 DAYS Performed at [facility], [place] [address] [place], [place] [address]    Report Status PENDING  Incomplete  Culture, Urine     Status: Abnormal   Collection Time: 07/04/20  7:56 PM   Specimen: Urine, Clean Catch  Result Value Ref Range Status   Specimen Description   Final    URINE, CLEAN CATCH Performed at [facility], [place] [address], [place], [place] [address]    Special Requests   Final    NONE Performed at [facility], [place] [address], [place], [place] [address]    Culture MULTIPLE SPECIES PRESENT, SUGGEST RECOLLECTION ([place]aponte)  Final    Report Status 07/06/2020 FINAL  Final         Radiology Studies: DG Abd Portable 1V  Result Date: 07/05/2020 CLINICAL DATA:  79 year old male with left-sided hydronephrosis. EXAM: PORTABLE ABDOMEN - 1 VIEW COMPARISON:  CT abdomen pelvis dated 05/02/2020. FINDINGS: Left pigtail ureteral stent. The previously seen 1 cm stone in the inferior pole of the left kidney is not visualized on this study. Linear calcific densities over the left renal silhouette, likely vascular calcification. There is no bowel dilatation or evidence of obstruction. No free air. Right upper quadrant cholecystectomy clips. Osteopenia with degenerative changes of the spine. No acute osseous pathology. IMPRESSION: Left pigtail ureteral stent.  No stone identified. Electronically Signed   By: Anner Crete M.D.   On: 07/05/2020 21:16        Scheduled Meds: . vitamin C  500 mg Oral BID  . baricitinib  2 mg Oral Daily  . feeding supplement (ENSURE ENLIVE)  237 mL Oral TID BM  . heparin  5,000 Units Subcutaneous Q8H  . HYDROcodone-acetaminophen  1 tablet Oral BID  . insulin aspart  0-5 Units Subcutaneous QHS  . insulin aspart  0-9 Units Subcutaneous TID WC  . insulin aspart  2 Units Subcutaneous TID WC  . Ipratropium-Albuterol  1 puff Inhalation BID  . levothyroxine  75 mcg Oral QAC breakfast  . linagliptin  5 mg Oral Daily  . multivitamin with minerals  1 tablet Oral Daily  . phosphorus  500 mg Oral QID  . predniSONE  50  mg Oral Daily  . sodium chloride flush  3 mL Intravenous Q12H   Continuous Infusions: . cefTRIAXone (ROCEPHIN)  IV 1 g (07/07/20 1223)  . dextrose 100 mL/hr at 07/07/20 0931     LOS: 4 days    Time spent: over 30 min    Fayrene Helper, MD [facility]   To contact the attending provider between 7A-7P or the covering provider during after hours 7P-7A, please log into the web site [email or web address] and access using universal [place] password for that web site. If you do not  have the password, please call the hospital operator.  07/07/2020, 4:39 PM

## 2020-07-08 ENCOUNTER — Inpatient Hospital Stay (HOSPITAL_COMMUNITY): Payer: Medicare Other

## 2020-07-08 DIAGNOSIS — U071 COVID-19: Secondary | ICD-10-CM | POA: Diagnosis not present

## 2020-07-08 DIAGNOSIS — J9601 Acute respiratory failure with hypoxia: Secondary | ICD-10-CM | POA: Diagnosis not present

## 2020-07-08 LAB — CBC WITH DIFFERENTIAL/PLATELET
Abs Immature Granulocytes: 0.09 10*3/uL — ABNORMAL HIGH (ref 0.00–0.07)
Basophils Absolute: 0 10*3/uL (ref 0.0–0.1)
Basophils Relative: 0 %
Eosinophils Absolute: 0 10*3/uL (ref 0.0–0.5)
Eosinophils Relative: 0 %
HCT: 36.5 % — ABNORMAL LOW (ref 39.0–52.0)
Hemoglobin: 11.7 g/dL — ABNORMAL LOW (ref 13.0–17.0)
Immature Granulocytes: 1 %
Lymphocytes Relative: 10 %
Lymphs Abs: 0.9 10*3/uL (ref 0.7–4.0)
MCH: 30.9 pg (ref 26.0–34.0)
MCHC: 32.1 g/dL (ref 30.0–36.0)
MCV: 96.3 fL (ref 80.0–100.0)
Monocytes Absolute: 0.3 10*3/uL (ref 0.1–1.0)
Monocytes Relative: 4 %
Neutro Abs: 7.5 10*3/uL (ref 1.7–7.7)
Neutrophils Relative %: 85 %
Platelets: 121 10*3/uL — ABNORMAL LOW (ref 150–400)
RBC: 3.79 MIL/uL — ABNORMAL LOW (ref 4.22–5.81)
RDW: 14.6 % (ref 11.5–15.5)
WBC: 8.8 10*3/uL (ref 4.0–10.5)
nRBC: 0 % (ref 0.0–0.2)

## 2020-07-08 LAB — COMPREHENSIVE METABOLIC PANEL
ALT: 28 U/L (ref 0–44)
AST: 28 U/L (ref 15–41)
Albumin: 2.8 g/dL — ABNORMAL LOW (ref 3.5–5.0)
Alkaline Phosphatase: 64 U/L (ref 38–126)
Anion gap: 11 (ref 5–15)
BUN: 52 mg/dL — ABNORMAL HIGH (ref 8–23)
CO2: 28 mmol/L (ref 22–32)
Calcium: 8.5 mg/dL — ABNORMAL LOW (ref 8.9–10.3)
Chloride: 109 mmol/L (ref 98–111)
Creatinine, Ser: 1.48 mg/dL — ABNORMAL HIGH (ref 0.61–1.24)
GFR, Estimated: 45 mL/min — ABNORMAL LOW (ref >60)
Glucose, Bld: 185 mg/dL — ABNORMAL HIGH (ref 70–99)
Potassium: 3.5 mmol/L (ref 3.5–5.1)
Sodium: 148 mmol/L — ABNORMAL HIGH (ref 135–145)
Total Bilirubin: 0.7 mg/dL (ref 0.3–1.2)
Total Protein: 6.5 g/dL (ref 6.5–8.1)

## 2020-07-08 LAB — CULTURE, BLOOD (ROUTINE X 2): Culture: NO GROWTH

## 2020-07-08 LAB — FERRITIN: Ferritin: 797 ng/mL — ABNORMAL HIGH (ref 24–336)

## 2020-07-08 LAB — D-DIMER, QUANTITATIVE: D-Dimer, Quant: 1.67 ug/mL-FEU — ABNORMAL HIGH (ref 0.00–0.50)

## 2020-07-08 LAB — C-REACTIVE PROTEIN: CRP: 6.3 mg/dL — ABNORMAL HIGH (ref <1.0)

## 2020-07-08 NOTE — Progress Notes (Signed)
PROGRESS NOTE    Jordan Ward  MLY:[id number] DOB: [birth year] DOA: 07/03/2020 PCP: Noreene Larsson, NP   Chief Complaint  Patient presents with   Weakness    Brief Narrative:  Per previous HPI Patient is Jordan Ward poor historian and much of the history is obtained from prior notes  This is Jordan Ward 79 year old male who has not been vaccinated against COVID-19 with Bowe Sidor complex medical history including MS with suprapubic catheter and recurrent Proteus UTIs, HFpEF, recent left ureteroscopy with stone extraction and stent placement on 8/24, CKD 3b, vascular disease, hypertension, stroke with left-sided weakness who presented today to the ED from the monoclonal antibody infusion center with acute hypoxic respiratory failure.  He was diagnosed with COVID-19 on October 1 but is unsure of when his symptoms first started but started with Dynasia Kercheval mild cough.  Wife is Jenissa Tyrell positive sick contact.  He was referred to the infusion center and was noted to be desaturating to the 70s on room air and was transferred to the ED initially requiring 7 LPM to maintain sats greater than 90%.  No fever, chills, nausea, vomiting, diarrhea, rash.  ED Course: Afebrile, bradycardic (58), hypotensive (124/75 -> 89/61 ->95/53) requiring 6 LPM.  Notable labs: Sodium 147, glucose 111, BUN 55, creatinine 2.94 (previously 1.74 on 8/9, baseline 1.3), LDH, ferritin 804, CRP 12.4, procalcitonin negative, lactic acid 1.3, WBC 4.7, Hb 12, platelets 100, D-dimer 2.4, fibrinogen 431, COVID-19 positive.  CXR with RML and RLL ill-defined opacity.  Assessment & Plan:   Principal Problem:   Acute hypoxemic respiratory failure due to COVID-19 [facility]) Active Problems:   Multiple sclerosis (HCC)   Hypothyroidism   Chronic diastolic (congestive) heart failure (HCC)   Essential hypertension   Cerebrovascular disease   Hypotension   Acute kidney injury superimposed on CKD ([place])  1. Acute hypoxic respiratory failure secondary to COVID-19   Possibly developing  COVID encephalopathy Jordan Ward. Unvaccinated b. CXR with ill defined opacity R mid lung and R base, likely representing atypical organism pneumonia c. Follow repeat CXR 10/7 with mild infiltrate R upper lung and R mid lung, chronic interstitial lung disease d. Currently requiring 3 L [place] (on 6 L at presentation) - wean as tolerated, stable today - desats with exertion e. Procalcitonin negative - hold off antibiotics for now f. Remdesivir x 5 days (10/5-10/9), Steroids x 10 days (10/5-present), baricitinib (10/5-present) (renal dosing) g. Strict I/O, daily weights h. Encourage Incentive Spirometry, Flutter Valve and Pronation as tolerated.  OOB.  Therapy.  COVID-19 Labs  Recent Labs    07/06/20 0556 07/07/20 0300 07/08/20 0414  DDIMER 1.34* 1.65* 1.67*  FERRITIN 903* 588* 797*  CRP 4.2* 3.1* 6.3*    Lab Results  Component Value Date   SARSCOV2NAA Detected (Jordan Ward) 06/29/2020   [place] NEGATIVE 05/21/2020   [place] NEGATIVE 05/02/2020   [place] NEGATIVE 04/18/2020   2. Elevated D dimer 1. Likely 2/2 covid 19 infection - will follow LE [place] (negative for DVT) 2. Trend, consider additional imaging as needed  3. Hypernatremia: requiring D5w, follow urine osms (438).  Follow with D5.  Strict I/O.    4. Multiple sclerosis with neurogenic bladder and suprapubic catheter  Frederick Marro. Not on any Biologics or immunomodulators b. Continue home pain regimen: Norco and Valium c. PT  3. Compensated HFpEF Jordan Ward. Holding home Coreg due to hypotension  4. AKI on CKD 3b   Left Sided Hydronephrosis Jordan Ward. Baseline creatinine 1.3-1.5, peaked at 2.94 b. Improving today, follow with IVF c. Follow UA ->  appears c/w UTI (see below) d. Follow renal US -> left sided hydro, discussed with urology who will eval -> recommending outpatient follow up and suppressive antibiotics when ready for discharge (discussed with Dr. Junious Silk)  5. Asymptomatic Bacteruria   Suprapubic Catheter 1. UA with +nitrite and LE,  hematuria  2. He's asymptomatic from this, will follow urine cx 3. Continue ceftriaxone (in setting of hydro and treatment with steroids/baricitinib), follow final cx (multiple species)  6. Hypertension now with hypotension Jordan Ward. BP improved b. Hold home antihypertensives (coreg, amlodipine)  6. Hypothyroidism Jordan Ward. Continue home Synthroid  7. History of stroke with left-sided weakness  8. Hypokalemia   Hypophosphatemia: replace and follow  9. Prediabetes   Hyperglycemia 2/2 Steroids: follow A1c (6.3), SSI  10. Thrombocytopenia: mild, some degree of chronicity  11. Goals of care: frail with slow to limited improvement since admission, will consult palliative care for [place] discussion.  Discussed with wife today.   DVT prophylaxis: heparin Code Status: full  Family Communication: none at bedside - 10/9 wife Disposition:   Status is: Inpatient  Remains inpatient appropriate because:Inpatient level of care appropriate due to severity of illness   Dispo: The patient is from: Home              Anticipated d/c is to: pending              Anticipated d/c date is: > 3 days              Patient currently is not medically stable to d/c.       Consultants:   none  Procedures:   none  Antimicrobials:  Anti-infectives (From admission, onward)   Start     Dose/Rate Route Frequency Ordered Stop   07/06/20 1000  cefTRIAXone (ROCEPHIN) 1 g in sodium chloride 0.9 % 100 mL IVPB        1 g 200 mL/hr over 30 Minutes Intravenous Every 24 hours 07/06/20 0904     07/04/20 1000  remdesivir 100 mg in sodium chloride 0.9 % 100 mL IVPB  Status:  Discontinued       "Followed by" Linked Group Details   100 mg 200 mL/hr over 30 Minutes Intravenous Every 24 hours 07/03/20 1559 07/03/20 1845   07/04/20 1000  remdesivir 100 mg in sodium chloride 0.9 % 100 mL IVPB  Status:  Discontinued       "Followed by" Linked Group Details   100 mg 200 mL/hr over 30 Minutes Intravenous Daily 07/03/20 1845  07/03/20 1905   07/04/20 1000  remdesivir 100 mg in sodium chloride 0.9 % 100 mL IVPB        100 mg 200 mL/hr over 30 Minutes Intravenous Every 24 hours 07/03/20 2008 07/07/20 1008   07/03/20 1845  remdesivir 200 mg in sodium chloride 0.9% 250 mL IVPB  Status:  Discontinued       "Followed by" Linked Group Details   200 mg 580 mL/hr over 30 Minutes Intravenous Once 07/03/20 1845 07/03/20 1905   07/03/20 1700  remdesivir 200 mg in sodium chloride 0.9% 250 mL IVPB       "Followed by" Linked Group Details   200 mg 580 mL/hr over 30 Minutes Intravenous Once 07/03/20 1559 07/03/20 1800     Subjective: Feels better than yesterday Nehemiah Mcfarren&Ox~2-3 (knows month, said Saturday for day instead of sunday)  Objective: Vitals:   07/07/20 1324 07/07/20 2046 07/08/20 0500 07/08/20 0602  BP: 117/62 124/67  139/75  Pulse: 65 74  (!)  59  Resp: 16 (!) 24  20  Temp: 98.6 F (37 C) 98.3 F (36.8 C)  97.8 F (36.6 C)  TempSrc:  Oral  Oral  SpO2: 94% 92%  97%  Weight:   50.9 kg   Height:        Intake/Output Summary (Last 24 hours) at 07/08/2020 1152 Last data filed at 07/08/2020 0500 Gross per 24 hour  Intake 2297.65 ml  Output 2525 ml  Net -227.35 ml   Filed Weights   07/06/20 0500 07/07/20 0500 07/08/20 0500  Weight: 51.7 kg 51.7 kg 50.9 kg    Examination:  General: No acute distress. Cardiovascular: Heart sounds show Kizzi Overbey regular rate, and rhythm. Lungs: Clear to auscultation bilaterally  Abdomen: Soft, nontender, nondistended Neurological: Alert and oriented  2-3. Moves all extremities 4. Cranial nerves II through XII grossly intact. Skin: Warm and dry. No rashes or lesions. Extremities: No clubbing or cyanosis. No edema.  Data Reviewed: I have personally reviewed following labs and imaging studies  CBC: Recent Labs  Lab 07/04/20 0428 07/05/20 0408 07/06/20 0556 07/07/20 0300 07/08/20 0414  WBC 3.1* 7.7 8.5 8.1 8.8  NEUTROABS 2.1 6.1 7.1 6.9 7.5  HGB 11.4* 11.1* 11.5* 11.1*  11.7*  HCT 37.1* 36.8* 35.7* 34.7* 36.5*  MCV 99.2 100.8* 94.7 95.1 96.3  PLT 86* 111* 113* 122* 121*    Basic Metabolic Panel: Recent Labs  Lab 07/05/20 0408 07/05/20 1723 07/06/20 0556 07/07/20 0300 07/08/20 0414  NA 152* 153* 147* 153* 148*  K 3.8 3.8 3.1* 3.2* 3.5  CL 121* 119* 116* 114* 109  CO2 21* 23 20* 26 28  GLUCOSE 134* 191* 213* 189* 185*  BUN 60* 65* 63* 59* 52*  CREATININE 2.02* 1.91* 1.69* 1.60* 1.48*  CALCIUM 8.4* 8.8* 8.5* 8.7* 8.5*  MG  --   --  2.2  --   --   PHOS  --   --  1.3* 2.1*  --     GFR: Estimated Creatinine Clearance: 30.1 mL/min (Immanuel Fedak) (by C-G formula based on SCr of 1.48 mg/dL (H)).  Liver Function Tests: Recent Labs  Lab 07/04/20 0428 07/05/20 0408 07/06/20 0556 07/07/20 0300 07/08/20 0414  AST 56* 44* 37 42* 28  ALT 20 18 20 28 28   ALKPHOS 52 51 54 60 64  BILITOT 0.6 0.4 0.6 0.7 0.7  PROT 6.8 6.3* 6.3* 6.3* 6.5  ALBUMIN 2.9* 2.8* 2.8* 2.8* 2.8*    CBG: Recent Labs  Lab 07/05/20 1119 07/05/20 1633 07/05/20 2112 07/06/20 0735 07/06/20 1130  GLUCAP 267* 174* 233* 199* 222*     Recent Results (from the past 240 hour(s))  Novel Coronavirus, NAA ([facility])     Status: Abnormal   Collection Time: 06/29/20  5:57 PM   Specimen: Nasopharyngeal(NP) swabs in vial transport medium   Nasopharynge  Screenin  Result Value Ref Range Status   SARS-CoV-2, NAA Detected (Jeriah Skufca) Not Detected Final    Comment: Patients who have Fergus Throne positive COVID-19 test result may now have treatment options. Treatment options are available for patients with mild to moderate symptoms and for hospitalized patients. Visit our website at [email or web address] for resources and information. This nucleic acid amplification test was developed and its performance characteristics determined by [employer]. Nucleic acid amplification tests include RT-PCR and TMA. This test has not been FDA cleared or approved. This test has been authorized by FDA  under an Emergency Use Authorization (EUA). This test is only authorized for the duration of time the declaration that  circumstances exist justifying the authorization of the emergency use of in vitro diagnostic tests for detection of SARS-CoV-2 virus and/or diagnosis of COVID-19 infection under section 564(b)(1) of the Act, 21 U.S.C. 431VQM-0(Q) (1), unless the authorization is terminated or revoked sooner. When diagnostic testing is negativ e, the possibility of Marcel Gary false negative result should be considered in the context of Evyn Putzier patient's recent exposures and the presence of clinical signs and symptoms consistent with COVID-19. An individual without symptoms of COVID-19 and who is not shedding SARS-CoV-2 virus would expect to have Vora Clover negative (not detected) result in this assay.   SARS-COV-2, NAA 2 DAY TAT     Status: None   Collection Time: 06/29/20  5:57 PM   Nasopharynge  Screenin  Result Value Ref Range Status   SARS-CoV-2, NAA 2 DAY TAT Performed  Final  Blood Culture (routine x 2)     Status: None (Preliminary result)   Collection Time: 07/03/20  2:01 PM   Specimen: BLOOD  Result Value Ref Range Status   Specimen Description   Final    BLOOD LEFT ANTECUBITAL Performed at [facility] [address], [place], [place] [address]    Special Requests   Final    BOTTLES DRAWN AEROBIC AND ANAEROBIC Blood Culture results may not be optimal due to an inadequate volume of blood received in culture bottles Performed at [facility] [address], [place], [place] [address]    Culture   Final    NO GROWTH 4 DAYS Performed at [facility], [place] [address] [place], [place] [address]    Report Status PENDING  Incomplete  Culture, Urine     Status: Abnormal   Collection Time: 07/04/20  7:56 PM   Specimen: Urine, Clean Catch  Result Value Ref Range Status   Specimen Description   Final    URINE, CLEAN CATCH Performed at [facility], [place] [address] [place], [place] [address]    Special Requests   Final    NONE Performed at [facility], [place] [address] [place],  [address]    Culture MULTIPLE SPECIES PRESENT, SUGGEST RECOLLECTION (Jasmaine Rochel)  Final   Report Status 07/06/2020 FINAL  Final         Radiology Studies: No results found.      Scheduled Meds:  vitamin C  500 mg Oral BID   baricitinib  2 mg Oral Daily   feeding supplement (ENSURE ENLIVE)  237 mL Oral TID BM   heparin  5,000 Units Subcutaneous Q8H   HYDROcodone-acetaminophen  1 tablet Oral BID   insulin aspart  0-5 Units Subcutaneous QHS   insulin aspart  0-9 Units Subcutaneous TID WC   insulin aspart  2 Units Subcutaneous TID WC   Ipratropium-Albuterol  1 puff Inhalation BID   levothyroxine  75 mcg Oral QAC breakfast   linagliptin  5 mg Oral Daily   multivitamin with minerals  1 tablet Oral Daily   predniSONE  50 mg Oral Daily   sodium chloride flush  3 mL Intravenous Q12H   Continuous Infusions:  cefTRIAXone (ROCEPHIN)  IV 1 g (07/08/20 1126)   dextrose 100 mL/hr at 07/08/20 0913     LOS: 5 days    Time spent: over 30 min    Fayrene Helper, MD [facility]   To contact the attending provider between 7A-7P or the covering provider during after hours 7P-7A, please log into the web site [email or web address] and  access using universal [place] password for that web site. If you do not have the password, please call the hospital operator.  07/08/2020, 11:52 AM

## 2020-07-08 NOTE — Progress Notes (Signed)
Chart reviewed.  Cr continues to downtrend to 1.48 from 1.6. Baseline of 1.3-1.5. No plan for RBUS or PCN.   Matt R. [facility]  Pager: [phone]

## 2020-07-09 DIAGNOSIS — U071 COVID-19: Secondary | ICD-10-CM | POA: Diagnosis not present

## 2020-07-09 DIAGNOSIS — Z515 Encounter for palliative care: Secondary | ICD-10-CM | POA: Diagnosis not present

## 2020-07-09 DIAGNOSIS — Z7189 Other specified counseling: Secondary | ICD-10-CM | POA: Diagnosis not present

## 2020-07-09 DIAGNOSIS — N179 Acute kidney failure, unspecified: Secondary | ICD-10-CM | POA: Diagnosis not present

## 2020-07-09 DIAGNOSIS — J9601 Acute respiratory failure with hypoxia: Secondary | ICD-10-CM | POA: Diagnosis not present

## 2020-07-09 LAB — CBC WITH DIFFERENTIAL/PLATELET
Abs Immature Granulocytes: 0.15 10*3/uL — ABNORMAL HIGH (ref 0.00–0.07)
Basophils Absolute: 0 10*3/uL (ref 0.0–0.1)
Basophils Relative: 0 %
Eosinophils Absolute: 0 10*3/uL (ref 0.0–0.5)
Eosinophils Relative: 0 %
HCT: 36.2 % — ABNORMAL LOW (ref 39.0–52.0)
Hemoglobin: 11.6 g/dL — ABNORMAL LOW (ref 13.0–17.0)
Immature Granulocytes: 1 %
Lymphocytes Relative: 9 %
Lymphs Abs: 1 10*3/uL (ref 0.7–4.0)
MCH: 30.4 pg (ref 26.0–34.0)
MCHC: 32 g/dL (ref 30.0–36.0)
MCV: 95 fL (ref 80.0–100.0)
Monocytes Absolute: 0.3 10*3/uL (ref 0.1–1.0)
Monocytes Relative: 3 %
Neutro Abs: 9.1 10*3/uL — ABNORMAL HIGH (ref 1.7–7.7)
Neutrophils Relative %: 87 %
Platelets: 127 10*3/uL — ABNORMAL LOW (ref 150–400)
RBC: 3.81 MIL/uL — ABNORMAL LOW (ref 4.22–5.81)
RDW: 14.4 % (ref 11.5–15.5)
WBC: 10.5 10*3/uL (ref 4.0–10.5)
nRBC: 0 % (ref 0.0–0.2)

## 2020-07-09 LAB — GLUCOSE, CAPILLARY
Glucose-Capillary: 172 mg/dL — ABNORMAL HIGH (ref 70–99)
Glucose-Capillary: 144 mg/dL — ABNORMAL HIGH (ref 70–99)
Glucose-Capillary: 148 mg/dL — ABNORMAL HIGH (ref 70–99)
Glucose-Capillary: 156 mg/dL — ABNORMAL HIGH (ref 70–99)
Glucose-Capillary: 172 mg/dL — ABNORMAL HIGH (ref 70–99)
Glucose-Capillary: 139 mg/dL — ABNORMAL HIGH (ref 70–99)
Glucose-Capillary: 168 mg/dL — ABNORMAL HIGH (ref 70–99)
Glucose-Capillary: 172 mg/dL — ABNORMAL HIGH (ref 70–99)
Glucose-Capillary: 144 mg/dL — ABNORMAL HIGH (ref 70–99)
Glucose-Capillary: 186 mg/dL — ABNORMAL HIGH (ref 70–99)
Glucose-Capillary: 150 mg/dL — ABNORMAL HIGH (ref 70–99)
Glucose-Capillary: 135 mg/dL — ABNORMAL HIGH (ref 70–99)
Glucose-Capillary: 128 mg/dL — ABNORMAL HIGH (ref 70–99)

## 2020-07-09 LAB — COMPREHENSIVE METABOLIC PANEL
ALT: 24 U/L (ref 0–44)
AST: 24 U/L (ref 15–41)
Albumin: 2.5 g/dL — ABNORMAL LOW (ref 3.5–5.0)
Alkaline Phosphatase: 62 U/L (ref 38–126)
Anion gap: 11 (ref 5–15)
BUN: 41 mg/dL — ABNORMAL HIGH (ref 8–23)
CO2: 26 mmol/L (ref 22–32)
Calcium: 8.7 mg/dL — ABNORMAL LOW (ref 8.9–10.3)
Chloride: 107 mmol/L (ref 98–111)
Creatinine, Ser: 1.29 mg/dL — ABNORMAL HIGH (ref 0.61–1.24)
GFR, Estimated: 53 mL/min — ABNORMAL LOW (ref >60)
Glucose, Bld: 176 mg/dL — ABNORMAL HIGH (ref 70–99)
Potassium: 4.3 mmol/L (ref 3.5–5.1)
Sodium: 144 mmol/L (ref 135–145)
Total Bilirubin: 0.5 mg/dL (ref 0.3–1.2)
Total Protein: 6.1 g/dL — ABNORMAL LOW (ref 6.5–8.1)

## 2020-07-09 LAB — D-DIMER, QUANTITATIVE: D-Dimer, Quant: 1.76 ug/mL-FEU — ABNORMAL HIGH (ref 0.00–0.50)

## 2020-07-09 LAB — FERRITIN: Ferritin: 672 ng/mL — ABNORMAL HIGH (ref 24–336)

## 2020-07-09 LAB — PHOSPHORUS: Phosphorus: 2.9 mg/dL (ref 2.5–4.6)

## 2020-07-09 LAB — C-REACTIVE PROTEIN: CRP: 4.6 mg/dL — ABNORMAL HIGH (ref <1.0)

## 2020-07-09 LAB — MAGNESIUM: Magnesium: 2 mg/dL (ref 1.7–2.4)

## 2020-07-09 MED ORDER — ONDANSETRON HCL 4 MG/2ML IJ SOLN
4.0000 mg | Freq: Four times a day (QID) | INTRAMUSCULAR | Status: DC | PRN
  Administered 2020-07-09 – 2020-07-13 (×4): 4 mg via INTRAVENOUS
  Filled 2020-07-09 (×4): qty 2

## 2020-07-09 NOTE — Consult Note (Signed)
Palliative care consult note  Reason for consult: Goals of care in light of COVID-19 pneumonia  Palliative care consult received.  Chart reviewed including personal review of pertinent labs and imaging.  Briefly, Jordan Ward is a 79 year old male with past medical history of MS with suprapubic catheter, recurrent Proteus UTIs, heart failure with preserved ejection fraction, recent left ureteroscopy with stone extraction and stent placement in August of this year, CKD 3B, vascular disease, hypertension, stroke with left-sided weakness who was not vaccinated for COVID-19 and presented to monoclonal antibody infusion center after contracting COVID-19 but was found to be in hypoxic respiratory failure and transitioned to the hospital where he has been admitted since that time.  Overall, he has not had significant clinical improvement and palliative care consulted for goals of care.  I met today with Mr. Patlan.  He was awake and alert and sitting the bedside chair on my arrival.  He is wearing oxygen via nasal cannula at 3 L.  He reports feeling poorly today and wishing that he would start to improve.  He states that he got some sort of medication earlier and he has been nauseous since that time.  He states his family is the most important thing to him.  He lives with his wife and his daughter has moved in to help care for them also.  He worked in [employer] and his favorite activity currently is to sit in his chair and complete word searches.  He reports that he is not very active and family tells him he needs to do more than this but he is content to sit and work on his puzzles.  We discussed his clinical course this admission and the fact that he continues to have slow/limited improvement since admission.  He tells me that he is hopeful that he will "turn the corner" soon.  We discussed concern that if he does not improve, he is at high risk to have continued complications and worsening status  particularly as he started off with impaired nutrition and functional status prior to admission.  While I was attempting to focus on concerns related to acute infection with Covid, most of his concern had to do with his chronic urinary problems and stent that had recently been placed.  We discussed wishes for his care moving forward.  He had completed a declaration for natural death that is in the medical record.  I talk with him about this and he states that he would never want to be intubated for a long period of time nor would he want something like a tracheostomy.  At this point, however, he would be open to short-term trial of mechanical ventilation of his condition declined.  I also called and spoke with his wife and daughter.  His wife reports that she has been getting regular updates from care team and she appreciates the information she has been getting.  We also discussed his clinical course and goals for his care plan moving forward.  At this point, both his daughter and wife remain hopeful for continued improvement and are invested in plan for continued aggressive interventions.  I talked with his wife about his intent when he completed document with desire for natural death and she expressed sentiment similar to his that he is stated he would never want to be long-term on life support or be kept alive in a vegetative state, however, she believes that he would want aggressive interventions in the event of cardiac or respiratory arrest.  He would not, however, want to be kept alive on a ventilator for a long period of time.  We discussed long clinical course that patients may have with Covid and the fact that he is at high risk to decline and may not realistically ever get back to his prior baseline.  His daughter and wife expressed understanding this, particularly as his wife is also recovering from Covid and continues to be weak several weeks after infection.  Again, they expressed hope that he  will improve and are invested in plan for continuation of current interventions for at least the next several days to continue to track his clinical course.  -Full code/full scope treatment -Limit of care for patient that he expressed today is that he would not want long-term mechanical ventilation or tracheostomy.  He would be open to short-term trial mechanical ventilation in the event of acute respiratory decline. -While I am going off service, I will ask another member of the palliative medicine team to follow-up later this week to continue to address goals based upon his clinical course. -Please call if there are specific palliative care needs in the interim.  Start time: 1710 End time: 1835 Total time: 75 minutes  Greater than 50%  of this time was spent counseling and coordinating care related to the above assessment and plan.  Micheline Rough, MD [facility] [phone]

## 2020-07-09 NOTE — Progress Notes (Addendum)
PROGRESS NOTE    [name]  FWY:[id number] DOB: 25-Jun-1941 DOA: 07/03/2020 PCP: Noreene Larsson, NP   Chief Complaint  Patient presents with  . Weakness    Brief Narrative:  Per previous HPI Patient is Jordan Ward poor historian and much of the history is obtained from prior notes  This is Jordan Ward 79 year old male who has not been vaccinated against COVID-19 with Ami Mally complex medical history including MS with suprapubic catheter and recurrent Proteus UTIs, HFpEF, recent left ureteroscopy with stone extraction and stent placement on 8/24, CKD 3b, vascular disease, hypertension, stroke with left-sided weakness who presented today to the ED from the monoclonal antibody infusion center with acute hypoxic respiratory failure.  He was diagnosed with COVID-19 on October 1 but is unsure of when his symptoms first started but started with Siler Mavis mild cough.  Wife is Andy Allende positive sick contact.  He was referred to the infusion center and was noted to be desaturating to the 70s on room air and was transferred to the ED initially requiring 7 LPM to maintain sats greater than 90%.  No fever, chills, nausea, vomiting, diarrhea, rash.  ED Course: Afebrile, bradycardic (58), hypotensive (124/75 -> 89/61 ->95/53) requiring 6 LPM.  Notable labs: Sodium 147, glucose 111, BUN 55, creatinine 2.94 (previously 1.74 on 8/9, baseline 1.3), LDH, ferritin 804, CRP 12.4, procalcitonin negative, lactic acid 1.3, WBC 4.7, Hb 12, platelets 100, D-dimer 2.4, fibrinogen 431, COVID-19 positive.  CXR with RML and RLL ill-defined opacity.  Assessment & Plan:   Principal Problem:   Acute hypoxemic respiratory failure due to COVID-19 [facility]) Active Problems:   Multiple sclerosis (HCC)   Hypothyroidism   Chronic diastolic (congestive) heart failure (HCC)   Essential hypertension   Cerebrovascular disease   Hypotension   Acute kidney injury superimposed on CKD ([place])  1. Acute hypoxic respiratory failure secondary to COVID-19  Possibly developing  COVID encephalopathy Mallory Schaad. Unvaccinated b. CXR with ill defined opacity R mid lung and R base, likely representing atypical organism pneumonia c. Follow repeat CXR 10/7 with mild infiltrate R upper lung and R mid lung, chronic interstitial lung disease d. CXR with coarse bibasilar reticular opacities e. Follow CT chest f. Currently requiring 3 L [place] (on 6 L at presentation) - wean as tolerated, stable today - desats with exertion g. Procalcitonin negative - hold off antibiotics for now h. Remdesivir x 5 days (10/5-10/9), Steroids x 10 days (10/5-present), baricitinib (10/5-present) (renal dosing) i. Strict I/O, daily weights j. Encourage Incentive Spirometry, Flutter Valve and Pronation as tolerated.  OOB.  Therapy.  COVID-19 Labs  Recent Labs    07/07/20 0300 07/08/20 0414 07/09/20 0431  DDIMER 1.65* 1.67* 1.76*  FERRITIN 588* 797* 672*  CRP 3.1* 6.3* 4.6*    Lab Results  Component Value Date   SARSCOV2NAA Detected (Caress Reffitt) 06/29/2020   [place] NEGATIVE 05/21/2020   [place] NEGATIVE 05/02/2020   [place] NEGATIVE 04/18/2020   2. Elevated D dimer 1. Likely 2/2 covid 19 infection - will follow LE [place] (negative for DVT) 2. Trend, consider additional imaging as needed, elevated d dimer likely 2/2 inflammation from covd  3. Hypernatremia: urine osms (438).  Strict I/o.  Follow.  Improved with D5.  4. Multiple sclerosis with neurogenic bladder and suprapubic catheter  [name]. Not on any Biologics or immunomodulators b. Continue home pain regimen: Norco and Valium c. PT  3. Compensated HFpEF [name]. Holding home Coreg due to hypotension  4. AKI on CKD 3b  Left Sided Hydronephrosis Angas Isabell. Baseline  creatinine 1.3-1.5, peaked at 2.94 b. Improving today, continue to follow c. Follow UA -> appears c/w UTI (see below) d. Follow renal US -> left sided hydro, discussed with urology who will eval -> recommending outpatient follow up and suppressive antibiotics when ready for discharge  (discussed with Dr. Junious Silk)  5. Asymptomatic Bacteruria  Suprapubic Catheter 1. UA with +nitrite and LE, hematuria  2. He's asymptomatic from this, will follow urine cx 3. Continue ceftriaxone (in setting of hydro and treatment with steroids/baricitinib), follow final cx (multiple species)  6. Hypertension now with hypotension [name]. BP improved b. Hold home antihypertensives (coreg, amlodipine)  6. Hypothyroidism [name]. Continue home Synthroid  7. History of stroke with left-sided weakness  8. Hypokalemia  Hypophosphatemia: replace and follow  9. Prediabetes  Hyperglycemia 2/2 Steroids: follow A1c (6.3), SSI  10. Thrombocytopenia: mild, some degree of chronicity  11. Goals of care: frail with slow to limited improvement since admission, will consult palliative care for [place] discussion.  Discussed with wife.   DVT prophylaxis: heparin Code Status: full  Family Communication: none at bedside - 10/11 wife Disposition:   Status is: Inpatient  Remains inpatient appropriate because:Inpatient level of care appropriate due to severity of illness   Dispo: The patient is from: Home              Anticipated d/c is to: pending              Anticipated d/c date is: > 3 days              Patient currently is not medically stable to d/c.       Consultants:   none  Procedures:   none  Antimicrobials:  Anti-infectives (From admission, onward)   Start     Dose/Rate Route Frequency Ordered Stop   07/06/20 1000  cefTRIAXone (ROCEPHIN) 1 g in sodium chloride 0.9 % 100 mL IVPB        1 g 200 mL/hr over 30 Minutes Intravenous Every 24 hours 07/06/20 0904 07/11/20 0959   07/04/20 1000  remdesivir 100 mg in sodium chloride 0.9 % 100 mL IVPB  Status:  Discontinued       "Followed by" Linked Group Details   100 mg 200 mL/hr over 30 Minutes Intravenous Every 24 hours 07/03/20 1559 07/03/20 1845   07/04/20 1000  remdesivir 100 mg in sodium chloride 0.9 % 100 mL IVPB  Status:   Discontinued       "Followed by" Linked Group Details   100 mg 200 mL/hr over 30 Minutes Intravenous Daily 07/03/20 1845 07/03/20 1905   07/04/20 1000  remdesivir 100 mg in sodium chloride 0.9 % 100 mL IVPB        100 mg 200 mL/hr over 30 Minutes Intravenous Every 24 hours 07/03/20 2008 07/07/20 1008   07/03/20 1845  remdesivir 200 mg in sodium chloride 0.9% 250 mL IVPB  Status:  Discontinued       "Followed by" Linked Group Details   200 mg 580 mL/hr over 30 Minutes Intravenous Once 07/03/20 1845 07/03/20 1905   07/03/20 1700  remdesivir 200 mg in sodium chloride 0.9% 250 mL IVPB       "Followed by" Linked Group Details   200 mg 580 mL/hr over 30 Minutes Intravenous Once 07/03/20 1559 07/03/20 1800     Subjective: No new complaints  Objective: Vitals:   07/08/20 1341 07/08/20 2011 07/09/20 0344 07/09/20 1319  BP: 136/72 114/71 (!) 146/61 116/62  Pulse: 66 74  60 65  Resp: 16 19  15   Temp: 98 F (36.7 C) 98 F (36.7 C) 97.9 F (36.6 C) 97.9 F (36.6 C)  TempSrc: Oral  Oral Oral  SpO2: 96% 96% 90% (!) 87%  Weight:   52.1 kg   Height:        Intake/Output Summary (Last 24 hours) at 07/09/2020 1703 Last data filed at 07/09/2020 1351 Gross per 24 hour  Intake 1940 ml  Output 1500 ml  Net 440 ml   Filed Weights   07/07/20 0500 07/08/20 0500 07/09/20 0344  Weight: 51.7 kg 50.9 kg 52.1 kg    Examination:  General: No acute distress. Cardiovascular: Heart sounds show Gizella [place] regular rate, and rhythm Lungs: Clear to auscultation bilaterally Abdomen: Soft, nontender, nondistended Neurological: Alert and oriented. Moves all extremities 4. Cranial nerves II through XII grossly intact. Skin: Warm and dry. No rashes or lesions. Extremities: No clubbing or cyanosis. No edema.  Data Reviewed: I have personally reviewed following labs and imaging studies  CBC: Recent Labs  Lab 07/05/20 0408 07/06/20 0556 07/07/20 0300 07/08/20 0414 07/09/20 0431  WBC 7.7 8.5 8.1 8.8 10.5   NEUTROABS 6.1 7.1 6.9 7.5 9.1*  HGB 11.1* 11.5* 11.1* 11.7* 11.6*  HCT 36.8* 35.7* 34.7* 36.5* 36.2*  MCV 100.8* 94.7 95.1 96.3 95.0  PLT 111* 113* 122* 121* 127*    Basic Metabolic Panel: Recent Labs  Lab 07/05/20 1723 07/06/20 0556 07/07/20 0300 07/08/20 0414 07/09/20 0431  NA 153* 147* 153* 148* 144  K 3.8 3.1* 3.2* 3.5 4.3  CL 119* 116* 114* 109 107  CO2 23 20* 26 28 26   GLUCOSE 191* 213* 189* 185* 176*  BUN 65* 63* 59* 52* 41*  CREATININE 1.91* 1.69* 1.60* 1.48* 1.29*  CALCIUM 8.8* 8.5* 8.7* 8.5* 8.7*  MG  --  2.2  --   --  2.0  PHOS  --  1.3* 2.1*  --  2.9    GFR: Estimated Creatinine Clearance: 35.3 mL/min (Ruthe Roemer) (by C-G formula based on SCr of 1.29 mg/dL (H)).  Liver Function Tests: Recent Labs  Lab 07/05/20 0408 07/06/20 0556 07/07/20 0300 07/08/20 0414 07/09/20 0431  AST 44* 37 42* 28 24  ALT 18 20 28 28 24   ALKPHOS 51 54 60 64 62  BILITOT 0.4 0.6 0.7 0.7 0.5  PROT 6.3* 6.3* 6.3* 6.5 6.1*  ALBUMIN 2.8* 2.8* 2.8* 2.8* 2.5*    CBG: Recent Labs  Lab 07/08/20 1626 07/08/20 2012 07/09/20 0721 07/09/20 1119 07/09/20 1558  GLUCAP 144* 186* 150* 135* 128*     Recent Results (from the past 240 hour(s))  Novel Coronavirus, NAA ([facility])     Status: Abnormal   Collection Time: 06/29/20  5:57 PM   Specimen: Nasopharyngeal(NP) swabs in vial transport medium   Nasopharynge  Screenin  Result Value Ref Range Status   SARS-CoV-2, NAA Detected (Alnisa Hasley) Not Detected Final    Comment: Patients who have Ivannah Zody positive COVID-19 test result may now have treatment options. Treatment options are available for patients with mild to moderate symptoms and for hospitalized patients. Visit our website at [email or web address] for resources and information. This nucleic acid amplification test was developed and its performance characteristics determined by [employer]. Nucleic acid amplification tests include RT-PCR and TMA. This test has not been FDA  cleared or approved. This test has been authorized by FDA under an Emergency Use Authorization (EUA). This test is only authorized for the duration of time the declaration that circumstances exist justifying  the authorization of the emergency use of in vitro diagnostic tests for detection of SARS-CoV-2 virus and/or diagnosis of COVID-19 infection under section 564(b)(1) of the Act, 21 U.S.C. 269SWN-4(O) (1), unless the authorization is terminated or revoked sooner. When diagnostic testing is negativ e, the possibility of Rogene Meth false negative result should be considered in the context of Shafin Pollio patient's recent exposures and the presence of clinical signs and symptoms consistent with COVID-19. An individual without symptoms of COVID-19 and who is not shedding SARS-CoV-2 virus would expect to have Ameya Kutz negative (not detected) result in this assay.   SARS-COV-2, NAA 2 DAY TAT     Status: None   Collection Time: 06/29/20  5:57 PM   Nasopharynge  Screenin  Result Value Ref Range Status   SARS-CoV-2, NAA 2 DAY TAT Performed  Final  Blood Culture (routine x 2)     Status: None   Collection Time: 07/03/20  2:01 PM   Specimen: BLOOD  Result Value Ref Range Status   Specimen Description   Final    BLOOD LEFT ANTECUBITAL Performed at [facility] [address], [place], [place] [address]    Special Requests   Final    BOTTLES DRAWN AEROBIC AND ANAEROBIC Blood Culture results may not be optimal due to an inadequate volume of blood received in culture bottles Performed at [facility] [address], [place], [place] [address]    Culture   Final    NO GROWTH 5 DAYS Performed at [facility], [place] [address] [place], [place] [address]    Report Status 07/08/2020 FINAL  Final  Culture, Urine     Status: Abnormal   Collection Time: 07/04/20  7:56 PM   Specimen: Urine, Clean Catch  Result Value Ref Range Status   Specimen Description   Final    URINE, CLEAN  CATCH Performed at [facility], [place] [address] [place], [place] [address]    Special Requests   Final    NONE Performed at [facility], [place] [address], [place], [place] [address]    Culture MULTIPLE SPECIES PRESENT, SUGGEST RECOLLECTION (Johnnathan Hagemeister)  Final   Report Status 07/06/2020 FINAL  Final         Radiology Studies: DG CHEST PORT 1 VIEW  Result Date: 07/08/2020 CLINICAL DATA:  Shortness of breath EXAM: PORTABLE CHEST 1 VIEW COMPARISON:  July 05, 2020, January 05, 2012 FINDINGS: The cardiomediastinal silhouette is unchanged in contour.Emphysematous changes. No pleural effusion. No pneumothorax. Coarse bibasilar reticular opacities likely reflecting likely reflecting underlying interstitial lung disease. RIGHT apical calcified nodular opacity. Visualized abdomen is unremarkable. Multilevel degenerative changes of the thoracic spine. IMPRESSION: 1. Coarse bibasilar reticular opacities likely reflecting underlying interstitial lung disease on Zeke Aker background of emphysema. Electronically Signed   By: Valentino Saxon MD   On: 07/08/2020 17:08        Scheduled Meds: . vitamin C  500 mg Oral BID  . baricitinib  2 mg Oral Daily  . feeding supplement (ENSURE ENLIVE)  237 mL Oral TID BM  . heparin  5,000 Units Subcutaneous Q8H  . HYDROcodone-acetaminophen  1 tablet Oral BID  . insulin aspart  0-5 Units Subcutaneous QHS  . insulin aspart  0-9 Units Subcutaneous TID WC  . insulin aspart  2 Units Subcutaneous TID WC  . Ipratropium-Albuterol  1 puff Inhalation BID  . levothyroxine  75 mcg Oral QAC breakfast  . linagliptin  5 mg Oral Daily  .  multivitamin with minerals  1 tablet Oral Daily  . predniSONE  50 mg Oral Daily  . sodium chloride flush  3 mL Intravenous Q12H   Continuous Infusions: . cefTRIAXone (ROCEPHIN)  IV 1 g (07/09/20 1141)     LOS: 6 days    Time spent: over 30 min    Fayrene Helper, MD [facility]   To contact the  attending provider between 7A-7P or the covering provider during after hours 7P-7A, please log into the web site [email or web address] and access using universal [place] password for that web site. If you do not have the password, please call the hospital operator.  07/09/2020, 5:03 PM

## 2020-07-09 NOTE — Care Management Important Message (Signed)
Important Message  Patient Details IM Letter given to the Patient Name: Jordan Ward MRN: [id number] Date of Birth: 11-01-1940   Medicare Important Message Given:  Yes     Kerin Salen 07/09/2020, 11:13 AM

## 2020-07-10 ENCOUNTER — Inpatient Hospital Stay (HOSPITAL_COMMUNITY): Payer: Medicare Other

## 2020-07-10 DIAGNOSIS — U071 COVID-19: Secondary | ICD-10-CM | POA: Diagnosis not present

## 2020-07-10 DIAGNOSIS — J9601 Acute respiratory failure with hypoxia: Secondary | ICD-10-CM | POA: Diagnosis not present

## 2020-07-10 LAB — CBC WITH DIFFERENTIAL/PLATELET
Abs Immature Granulocytes: 0.13 10*3/uL — ABNORMAL HIGH (ref 0.00–0.07)
Basophils Absolute: 0 10*3/uL (ref 0.0–0.1)
Basophils Relative: 0 %
Eosinophils Absolute: 0 10*3/uL (ref 0.0–0.5)
Eosinophils Relative: 0 %
HCT: 33.2 % — ABNORMAL LOW (ref 39.0–52.0)
Hemoglobin: 10.4 g/dL — ABNORMAL LOW (ref 13.0–17.0)
Immature Granulocytes: 2 %
Lymphocytes Relative: 10 %
Lymphs Abs: 0.6 10*3/uL — ABNORMAL LOW (ref 0.7–4.0)
MCH: 30.6 pg (ref 26.0–34.0)
MCHC: 31.3 g/dL (ref 30.0–36.0)
MCV: 97.6 fL (ref 80.0–100.0)
Monocytes Absolute: 0.1 10*3/uL (ref 0.1–1.0)
Monocytes Relative: 2 %
Neutro Abs: 5.3 10*3/uL (ref 1.7–7.7)
Neutrophils Relative %: 86 %
Platelets: 109 10*3/uL — ABNORMAL LOW (ref 150–400)
RBC: 3.4 MIL/uL — ABNORMAL LOW (ref 4.22–5.81)
RDW: 14.3 % (ref 11.5–15.5)
WBC: 6.2 10*3/uL (ref 4.0–10.5)
nRBC: 0 % (ref 0.0–0.2)

## 2020-07-10 LAB — COMPREHENSIVE METABOLIC PANEL
ALT: 19 U/L (ref 0–44)
AST: 19 U/L (ref 15–41)
Albumin: 2.3 g/dL — ABNORMAL LOW (ref 3.5–5.0)
Alkaline Phosphatase: 62 U/L (ref 38–126)
Anion gap: 8 (ref 5–15)
BUN: 40 mg/dL — ABNORMAL HIGH (ref 8–23)
CO2: 26 mmol/L (ref 22–32)
Calcium: 8.2 mg/dL — ABNORMAL LOW (ref 8.9–10.3)
Chloride: 109 mmol/L (ref 98–111)
Creatinine, Ser: 1.4 mg/dL — ABNORMAL HIGH (ref 0.61–1.24)
GFR, Estimated: 48 mL/min — ABNORMAL LOW (ref >60)
Glucose, Bld: 144 mg/dL — ABNORMAL HIGH (ref 70–99)
Potassium: 5 mmol/L (ref 3.5–5.1)
Sodium: 143 mmol/L (ref 135–145)
Total Bilirubin: 0.4 mg/dL (ref 0.3–1.2)
Total Protein: 5.4 g/dL — ABNORMAL LOW (ref 6.5–8.1)

## 2020-07-10 LAB — C-REACTIVE PROTEIN: CRP: 3.1 mg/dL — ABNORMAL HIGH (ref <1.0)

## 2020-07-10 LAB — MAGNESIUM: Magnesium: 2.2 mg/dL (ref 1.7–2.4)

## 2020-07-10 LAB — PHOSPHORUS: Phosphorus: 4.2 mg/dL (ref 2.5–4.6)

## 2020-07-10 LAB — GLUCOSE, CAPILLARY
Glucose-Capillary: 110 mg/dL — ABNORMAL HIGH (ref 70–99)
Glucose-Capillary: 122 mg/dL — ABNORMAL HIGH (ref 70–99)
Glucose-Capillary: 124 mg/dL — ABNORMAL HIGH (ref 70–99)
Glucose-Capillary: 151 mg/dL — ABNORMAL HIGH (ref 70–99)
Glucose-Capillary: 163 mg/dL — ABNORMAL HIGH (ref 70–99)

## 2020-07-10 LAB — FERRITIN: Ferritin: 585 ng/mL — ABNORMAL HIGH (ref 24–336)

## 2020-07-10 LAB — D-DIMER, QUANTITATIVE: D-Dimer, Quant: 1.65 ug/mL-FEU — ABNORMAL HIGH (ref 0.00–0.50)

## 2020-07-10 NOTE — Progress Notes (Signed)
Physical Therapy Treatment Patient Details Name: Jordan Ward MRN: [id number] DOB: 06-29-1941 Today's Date: 07/10/2020    History of Present Illness 79 yo male admitted with COVID. Hx of MS, HF, CKD, CVA with L sided weakness, CAD, gait d/o, PVD, neurogenic bladder    PT Comments    Pt continues to participate fairly well. O2 85% on 2L with ambulation; 91% 2L at rest EOS. He had a little more difficulty with bed mobility on today compared to evaluation. Will continue to progress activity as tolerated. Pt c/o L heel pain so floated both heels with pillows under calves.    Follow Up Recommendations  Home health PT;Supervision/Assistance - 24 hour     Equipment Recommendations       Recommendations for Other Services       Precautions / Restrictions Precautions Precautions: Fall Precaution Comments: monitor O2 Restrictions Weight Bearing Restrictions: No    Mobility  Bed Mobility Overal bed mobility: Needs Assistance Bed Mobility: Supine to Sit;Sit to Supine     Supine to sit: Min guard;HOB elevated Sit to supine: Min assist   General bed mobility comments: Effortful for pt on today. Increased time. Assist for LEs back onto bed.  Transfers Overall transfer level: Needs assistance Equipment used: Rolling walker (2 wheeled) Transfers: Sit to/from Stand Sit to Stand: Min guard;From elevated surface         General transfer comment: no physical assist to power up min G for safety only, min cues for hand placement to reach back for chair when sitting  Ambulation/Gait Ambulation/Gait assistance: Min assist Gait Distance (Feet): 65 Feet Assistive device: Rolling walker (2 wheeled) Gait Pattern/deviations: Step-to pattern;Step-through pattern;Decreased stride length;Decreased step length - left;Narrow base of support     General Gait Details: Narrow BOS with intermittent difficulty L LE swing through. Assist to steady throughout distance. O2 85% 2L.   Stairs              Wheelchair Mobility    Modified Rankin (Stroke Patients Only)       Balance Overall balance assessment: Needs assistance         Standing balance support: Bilateral upper extremity supported Standing balance-Leahy Scale: Poor                              Cognition Arousal/Alertness: Awake/alert Behavior During Therapy: WFL for tasks assessed/performed Overall Cognitive Status: Within Functional Limits for tasks assessed Area of Impairment: Problem solving                             Problem Solving: Requires verbal cues General Comments: very slow to respond at times-likely 2* MS      Exercises      General Comments        Pertinent Vitals/Pain Pain Assessment: L heel pain. Face scale: 4/10    Home Living                      Prior Function            PT Goals (current goals can now be found in the care plan section) Progress towards PT goals: Progressing toward goals    Frequency    Min 3X/week      PT Plan Current plan remains appropriate    Co-evaluation              AM-PAC  PT "6 Clicks" Mobility   Outcome Measure  Help needed turning from your back to your side while in a flat bed without using bedrails?: A Little Help needed moving from lying on your back to sitting on the side of a flat bed without using bedrails?: A Little Help needed moving to and from a bed to a chair (including a wheelchair)?: A Little Help needed standing up from a chair using your arms (e.g., wheelchair or bedside chair)?: A Little Help needed to walk in hospital room?: A Little Help needed climbing 3-5 steps with a railing? : A Lot 6 Click Score: 17    End of Session Equipment Utilized During Treatment: Gait belt;Oxygen Activity Tolerance: Patient tolerated treatment well Patient left: in bed;with call bell/phone within reach;with bed alarm set   PT Visit Diagnosis: Muscle weakness (generalized)  (M62.81);History of falling (Z91.81);Difficulty in walking, not elsewhere classified (R26.2)     Time: 4695-0722 PT Time Calculation (min) (ACUTE ONLY): 25 min  Charges:  $Gait Training: 23-37 mins                        Doreatha Massed, PT Acute Rehabilitation  Office: [phone] Pager: [phone]

## 2020-07-10 NOTE — Progress Notes (Addendum)
PROGRESS NOTE    Jordan [place]  GYI:[id number] DOB: 07/13/1941 DOA: 07/03/2020 PCP: Noreene Larsson, NP   Chief Complaint  Patient presents with  . Weakness    Brief Narrative:  Per previous HPI Patient is Jordan [place] and much of the history is obtained from prior notes  This is Jordan [place] 79 year old male who has not been vaccinated against COVID-19 with Jordan [place] complex medical history including MS with suprapubic catheter and recurrent Proteus UTIs, HFpEF, recent left ureteroscopy with stone extraction and stent placement on 8/24, CKD 3b, vascular disease, hypertension, stroke with left-sided weakness who presented today to the ED from the monoclonal antibody infusion center with acute hypoxic respiratory failure.  He was diagnosed with COVID-19 on October 1 but is unsure of when his symptoms first started but started with Jordan [place] mild cough.  Wife is Jordan [place] positive sick contact.  He was referred to the infusion center and was noted to be desaturating to the 70s on room air and was transferred to the ED initially requiring 7 LPM to maintain sats greater than 90%.  No fever, chills, nausea, vomiting, diarrhea, rash.  ED Course: Afebrile, bradycardic (58), hypotensive (124/75 -> 89/61 ->95/53) requiring 6 LPM.  Notable labs: Sodium 147, glucose 111, BUN 55, creatinine 2.94 (previously 1.74 on 8/9, baseline 1.3), LDH, ferritin 804, CRP 12.4, procalcitonin negative, lactic acid 1.3, WBC 4.7, Hb 12, platelets 100, D-dimer 2.4, fibrinogen 431, COVID-19 positive.  CXR with RML and RLL ill-defined opacity.  Assessment & Plan:   Principal Problem:   Acute hypoxemic respiratory failure due to COVID-19 [facility]) Active Problems:   Multiple sclerosis (HCC)   Hypothyroidism   Chronic diastolic (congestive) heart failure (HCC)   Essential hypertension   Cerebrovascular disease   Hypotension   Acute kidney injury superimposed on CKD ([place])  1. Acute hypoxic respiratory failure secondary to COVID-19  Possibly developing  COVID encephalopathy Jordan [place]. Unvaccinated b. CXR with ill defined opacity R mid lung and R base, likely representing atypical organism pneumonia c. Follow repeat CXR 10/7 with mild infiltrate R upper lung and R mid lung, chronic interstitial lung disease d. CXR with coarse bibasilar reticular opacities e. Follow CT chest f. Currently requiring 2 L [place] (on 6 L at presentation) - wean as tolerated, stable today - desats with exertion g. Procalcitonin negative - hold off antibiotics for now h. Remdesivir x 5 days (10/5-10/9), Steroids x 10 days (10/5-present), baricitinib (10/5-present) (renal dosing) i. Strict I/O, daily weights j. Encourage Incentive Spirometry, Flutter Valve and Pronation as tolerated.  OOB.  Therapy.  COVID-19 Labs  Recent Labs    07/08/20 0414 07/09/20 0431 07/10/20 0414  DDIMER 1.67* 1.76* 1.65*  FERRITIN 797* 672* 585*  CRP 6.3* 4.6* 3.1*    Lab Results  Component Value Date   SARSCOV2NAA Detected ([place]) 06/29/2020   [place] NEGATIVE 05/21/2020   [place] NEGATIVE 05/02/2020   [place] NEGATIVE 04/18/2020   2. Elevated D dimer 1. Likely 2/2 covid 19 infection - will follow LE [place] (negative for DVT) 2. Trend, consider additional imaging as needed, elevated d dimer likely 2/2 inflammation from covd  3. Hypernatremia: urine osms (438).  Strict I/o.  Follow.  Improved with D5.  Continue to monitor off IVFs.  4. Multiple sclerosis with neurogenic bladder and suprapubic catheter  Jordan [place]. Not on any Biologics or immunomodulators b. Continue home pain regimen: Norco and Valium c. PT  3. Compensated HFpEF Jordan [place]. Holding home Coreg due to hypotension  4. AKI on CKD 3b  Left Sided Hydronephrosis Jordan [place]. Baseline creatinine 1.3-1.5, peaked at 2.94 b. Improving today, continue to follow c. Follow UA -> appears c/w UTI (see below) d. Follow renal US -> left sided hydro, discussed with urology who will eval -> recommending outpatient follow up and suppressive antibiotics  when ready for discharge (discussed with Dr. Junious Silk)  5. Asymptomatic Bacteruria  Suprapubic Catheter 1. UA with +nitrite and LE, hematuria  2. He's asymptomatic from this, will follow urine cx 3. Continue ceftriaxone x5days (in setting of hydro and treatment with steroids/baricitinib), follow final cx (multiple species)  6. Hypertension now with hypotension Jordan [place]. BP improved b. Hold home antihypertensives (coreg, amlodipine)  6. Hypothyroidism Jordan [place]. Continue home Synthroid  7. History of stroke with left-sided weakness  8. Hypokalemia  Hypophosphatemia: replace and follow  9. Prediabetes  Hyperglycemia 2/2 Steroids: follow A1c (6.3), SSI  10. Thrombocytopenia: mild, some degree of chronicity  11. Goals of care: frail with slow to limited improvement since admission, will consult palliative care for [place] discussion.  Discussed with wife.   DVT prophylaxis: heparin Code Status: full  Family Communication: none at bedside - 10/12 wife Disposition:   Status is: Inpatient  Remains inpatient appropriate because:Inpatient level of care appropriate due to severity of illness   Dispo: The patient is from: Home              Anticipated d/c is to: pending              Anticipated d/c date is: > 3 days              Patient currently is not medically stable to d/c.       Consultants:   none  Procedures:   none  Antimicrobials:  Anti-infectives (From admission, onward)   Start     Dose/Rate Route Frequency Ordered Stop   07/06/20 1000  cefTRIAXone (ROCEPHIN) 1 g in sodium chloride 0.9 % 100 mL IVPB        1 g 200 mL/hr over 30 Minutes Intravenous Every 24 hours 07/06/20 0904 07/10/20 1206   07/04/20 1000  remdesivir 100 mg in sodium chloride 0.9 % 100 mL IVPB  Status:  Discontinued       "Followed by" Linked Group Details   100 mg 200 mL/hr over 30 Minutes Intravenous Every 24 hours 07/03/20 1559 07/03/20 1845   07/04/20 1000  remdesivir 100 mg in sodium chloride 0.9  % 100 mL IVPB  Status:  Discontinued       "Followed by" Linked Group Details   100 mg 200 mL/hr over 30 Minutes Intravenous Daily 07/03/20 1845 07/03/20 1905   07/04/20 1000  remdesivir 100 mg in sodium chloride 0.9 % 100 mL IVPB        100 mg 200 mL/hr over 30 Minutes Intravenous Every 24 hours 07/03/20 2008 07/07/20 1008   07/03/20 1845  remdesivir 200 mg in sodium chloride 0.9% 250 mL IVPB  Status:  Discontinued       "Followed by" Linked Group Details   200 mg 580 mL/hr over 30 Minutes Intravenous Once 07/03/20 1845 07/03/20 1905   07/03/20 1700  remdesivir 200 mg in sodium chloride 0.9% 250 mL IVPB       "Followed by" Linked Group Details   200 mg 580 mL/hr over 30 Minutes Intravenous Once 07/03/20 1559 07/03/20 1800     Subjective: No new complaints, maybe feeling Jordan [place] little better  Objective: Vitals:   07/10/20 0435 07/10/20 0500 07/10/20 0800 07/10/20 1319  BP: (!) 110/58   132/66  Pulse: (!) 53   62  Resp: 19   18  Temp: 97.6 F (36.4 C)   98.7 F (37.1 C)  TempSrc:    Oral  SpO2: (!) 88%  90% (!) 88%  Weight:  52 kg    Height:        Intake/Output Summary (Last 24 hours) at 07/10/2020 1443 Last data filed at 07/10/2020 1224 Gross per 24 hour  Intake 833 ml  Output 1250 ml  Net -417 ml   Filed Weights   07/08/20 0500 07/09/20 0344 07/10/20 0500  Weight: 50.9 kg 52.1 kg 52 kg    Examination:  General: No acute distress. Cardiovascular: Heart sounds show Jordan [place] regular rate, and rhythm Lungs: CTAB Abdomen: Soft, nontender, nondistended Neurological: Alert and oriented 3. Moves all extremities 4. Cranial nerves II through XII grossly intact. Skin: Warm and dry. No rashes or lesions. Extremities: No clubbing or cyanosis. No edema.   Data Reviewed: I have personally reviewed following labs and imaging studies  CBC: Recent Labs  Lab 07/06/20 0556 07/07/20 0300 07/08/20 0414 07/09/20 0431 07/10/20 0414  WBC 8.5 8.1 8.8 10.5 6.2  NEUTROABS 7.1 6.9 7.5  9.1* 5.3  HGB 11.5* 11.1* 11.7* 11.6* 10.4*  HCT 35.7* 34.7* 36.5* 36.2* 33.2*  MCV 94.7 95.1 96.3 95.0 97.6  PLT 113* 122* 121* 127* 109*    Basic Metabolic Panel: Recent Labs  Lab 07/06/20 0556 07/07/20 0300 07/08/20 0414 07/09/20 0431 07/10/20 0414  NA 147* 153* 148* 144 143  K 3.1* 3.2* 3.5 4.3 5.0  CL 116* 114* 109 107 109  CO2 20* 26 28 26 26   GLUCOSE 213* 189* 185* 176* 144*  BUN 63* 59* 52* 41* 40*  CREATININE 1.69* 1.60* 1.48* 1.29* 1.40*  CALCIUM 8.5* 8.7* 8.5* 8.7* 8.2*  MG 2.2  --   --  2.0 2.2  PHOS 1.3* 2.1*  --  2.9 4.2    GFR: Estimated Creatinine Clearance: 32.5 mL/min (Rona Tomson) (by C-G formula based on SCr of 1.4 mg/dL (H)).  Liver Function Tests: Recent Labs  Lab 07/06/20 0556 07/07/20 0300 07/08/20 0414 07/09/20 0431 07/10/20 0414  AST 37 42* 28 24 19   ALT 20 28 28 24 19   ALKPHOS 54 60 64 62 62  BILITOT 0.6 0.7 0.7 0.5 0.4  PROT 6.3* 6.3* 6.5 6.1* 5.4*  ALBUMIN 2.8* 2.8* 2.8* 2.5* 2.3*    CBG: Recent Labs  Lab 07/09/20 0721 07/09/20 1119 07/09/20 1558 07/09/20 2020 07/10/20 0736  GLUCAP 150* 135* 128* 110* 122*     Recent Results (from the past 240 hour(s))  Blood Culture (routine x 2)     Status: None   Collection Time: 07/03/20  2:01 PM   Specimen: BLOOD  Result Value Ref Range Status   Specimen Description   Final    BLOOD LEFT ANTECUBITAL Performed at [facility], [place] [address] [place] [address], [place], [place] [address]    Special Requests   Final    BOTTLES DRAWN AEROBIC AND ANAEROBIC Blood Culture results may not be optimal due to an inadequate volume of blood received in culture bottles Performed at [place] [address], [place], [place] [address]    Culture   Final    NO GROWTH 5 DAYS Performed at [facility], [place] [address], [place], [place] [address]    Report Status 07/08/2020 FINAL  Final  Culture, Urine     Status: Abnormal  Collection Time: 07/04/20  7:56 PM   Specimen:  Urine, Clean Catch  Result Value Ref Range Status   Specimen Description   Final    URINE, CLEAN CATCH Performed at [facility], [place] [address], [place], [place] [address]    Special Requests   Final    NONE Performed at [facility], [place] [address] [place], [place] [address]    Culture MULTIPLE SPECIES PRESENT, SUGGEST RECOLLECTION (Cerita Rabelo)  Final   Report Status 07/06/2020 FINAL  Final         Radiology Studies: No results found.      Scheduled Meds: . vitamin C  500 mg Oral BID  . baricitinib  2 mg Oral Daily  . feeding supplement (ENSURE ENLIVE)  237 mL Oral TID BM  . heparin  5,000 Units Subcutaneous Q8H  . HYDROcodone-acetaminophen  1 tablet Oral BID  . insulin aspart  0-5 Units Subcutaneous QHS  . insulin aspart  0-9 Units Subcutaneous TID WC  . insulin aspart  2 Units Subcutaneous TID WC  . Ipratropium-Albuterol  1 puff Inhalation BID  . levothyroxine  75 mcg Oral QAC breakfast  . linagliptin  5 mg Oral Daily  . multivitamin with minerals  1 tablet Oral Daily  . predniSONE  50 mg Oral Daily  . sodium chloride flush  3 mL Intravenous Q12H   Continuous Infusions:    LOS: 7 days    Time spent: over 30 min    Fayrene Helper, MD [facility]   To contact the attending provider between 7A-7P or the covering provider during after hours 7P-7A, please log into the web site [email or web address] and access using universal [place] password for that web site. If you do not have the password, please call the hospital operator.  07/10/2020, 2:43 PM

## 2020-07-10 NOTE — Progress Notes (Signed)
  Speech Language Pathology Treatment: Dysphagia  Patient Details Name: Jordan [place] MRN: [id number] DOB: 09/21/1941 Today's Date: 07/10/2020 Time: 1501-1550 SLP Time Calculation (min) (ACUTE ONLY): 49 min  Assessment / Plan / Recommendation Clinical Impression  Pt today seen to contiue RMST for strenghtening hyolaryngeal elevators.  Unfortunately, SLP located his EMT trainer on the table against the [place] where pt could not reach it.   Pt advised he was unable to read the sign SLP Posted but he did recall exercise once SLP provided him with his trainer. Pt performed 10 repetitions at level 8 for EMT - reporting it was "not too bad".  Anticipate his repetitions or intensity will be able to be increased based on his performance today.  He benefits from moderate verbal cues to allow appropriate rest breaks between repetitions and number.  SLP further posted sign for staff to leave the apparatus within pt's reach.  In addition, observed pt with intake of nectar thickened soda - no indication of aspiration and swallow was timely.  Pt reports displeasure with food items and thus SLP ordered pt's meals for tonight and tomorrow.  Wrote out food items pt enjoys eating and posted in his room for staff to use to order his meals.  Will continue efforts for dysphagia treatment and pursue MBS prior to dc.  Marland Kitchen    HPI HPI: 79 yo with complex med hx including MS and chronic dysphagia dating back to at least 2013 - stroke hx with left sided weakness admitted with COVID.   Prior MBS studies for dysphagia have shown asp of thin, thickened liquids 2013 and 2016 - 09/2014-07/2015 after which he stopped using per notes.  Pt advised he does not like thick liquids to prior SLP.  He also has a  Zenker's - seen on prior MBS.  Pt found to have RU and RM lobe pna CXR.  Swallow eval ordered. Upon entrance to room, pt presents with a large amount of meat retained in his oral cavity though he had finished his meal during initial  evaluation.  SLP has been seeing pt for dysphagia treatment including respiratory muscle strength training.  Plans include pt undergoing MBS prior to dc from the hospital to assure on least restricted diet.  He has a h/o known aspiration of thin and per his statement, his wife reported to him plan to undergo MBS due to pt having increased coughing with intake.  Diet has been modified to decrease/mitigate risk.  Today pt seen for continued therapy.      SLP Plan  Continue with current plan of care       Recommendations  Liquids provided via: Cup;Straw;Teaspoon Medication Administration: Whole meds with puree Supervision: Staff to assist with self feeding (pt can give himself ice but needs assist with meals) Compensations: Slow rate;Small sips/bites;Other (Comment) Postural Changes and/or Swallow Maneuvers: Seated upright 90 degrees;Upright 30-60 min after meal                Oral Care Recommendations: Oral care BID Follow up Recommendations: Home health SLP SLP Visit Diagnosis: Dysphagia, oropharyngeal phase (R13.12);Dysphagia, pharyngoesophageal phase (R13.14) Plan: Continue with current plan of care       GO               Kathleen Lime, MS [place] Office [phone] Pager [phone]   Macario Golds 07/10/2020, 5:50 PM

## 2020-07-10 NOTE — Progress Notes (Signed)
Occupational Therapy Treatment Patient Details Name: Jordan [place] MRN: [id number] DOB: Feb 05, 1941 Today's Date: 07/10/2020    History of present illness 79 yo male admitted with [place]. Hx of MS, HF, CKD, CVA with L sided weakness, CAD, gait d/o, PVD, neurogenic bladder   OT comments  Patient min G for functional transfers and ambulation with rolling walker to/from bathroom for ADLs. Patient does require increased time for all mobility however no overt LOB noted. Patient leans heavily onto sink while brushing teeth, with limited standing tolerance requiring seated rest to wash face and comb hair. Patient on 2L throughout session with no reports or signs of dyspnea. With probe on pt ear still having max difficulty obtaining accurate wave form. RN aware.   Follow Up Recommendations  Home health OT;Supervision/Assistance - 24 hour    Equipment Recommendations  None recommended by OT       Precautions / Restrictions Precautions Precautions: Fall Precaution Comments: monitor O2 Restrictions Weight Bearing Restrictions: No       Mobility Bed Mobility               General bed mobility comments: in recliner  Transfers Overall transfer level: Needs assistance Equipment used: Rolling walker (2 wheeled) Transfers: Sit to/from Stand Sit to Stand: Min guard         General transfer comment: no physical assist to power up min G for safety only, min cues for hand placement to reach back for chair when sitting    Balance Overall balance assessment: Needs assistance Sitting-balance support: Feet supported Sitting balance-Leahy Scale: Fair     Standing balance support: Single extremity supported;During functional activity Standing balance-Leahy Scale: Poor Standing balance comment: leans heavily onto sink when brushing teeth                           ADL either performed or assessed with clinical judgement   ADL Overall ADL's : Needs assistance/impaired      Grooming: Oral care;Wash/dry face;Set up;Min guard;Standing;Sitting;Brushing hair Grooming Details (indicate cue type and reason): patient leans heavily onto sink for stability while brushing teeth and min G for safety, decreased standing tolerance requiring seated rest and set up to wash face, comb hair.                  Toilet Transfer: Min guard;Ambulation;RW;Cueing for safety Toilet Transfer Details (indicate cue type and reason): with increased time patient able to power up to standing with min G for safety from recliner and BSC used as chair for rest in bathroom.         Functional mobility during ADLs: Min guard;Rolling walker;Cueing for safety                 Cognition Arousal/Alertness: Awake/alert Behavior During Therapy: WFL for tasks assessed/performed Overall Cognitive Status: Within Functional Limits for tasks assessed                                                General Comments patient on 2L throughout session with no signs or patient c/o dyspnea. even with probe on patient's finger very difficult to obtain accurate wave form with RN aware    Pertinent Vitals/ Pain       Pain Assessment: Faces Faces Pain Scale: No hurt  Frequency  Min 2X/week        Progress Toward Goals  OT Goals(current goals can now be found in the care plan section)  Progress towards OT goals: Progressing toward goals  Acute Rehab OT Goals Patient Stated Goal: home OT Goal Formulation: With patient Time For Goal Achievement: 07/19/20 Potential to Achieve Goals: Good  Plan Discharge plan remains appropriate       AM-PAC OT "6 Clicks" Daily Activity     Outcome Measure   Help from another person eating meals?: None Help from another person taking care of personal grooming?: A Little Help from another person toileting, which includes using toliet, bedpan, or urinal?: A Little Help from another person bathing (including washing, rinsing,  drying)?: A Little Help from another person to put on and taking off regular upper body clothing?: A Little Help from another person to put on and taking off regular lower body clothing?: A Little 6 Click Score: 19    End of Session Equipment Utilized During Treatment: Oxygen;Rolling walker  OT Visit Diagnosis: Unsteadiness on feet (R26.81);Other abnormalities of gait and mobility (R26.89)   Activity Tolerance Patient tolerated treatment well   Patient Left in chair;with call bell/phone within reach;with chair alarm set;with nursing/sitter in room   Nurse Communication Mobility status        Time: 1000-1024 OT Time Calculation (min): 24 min  Charges: OT General Charges $OT Visit: 1 Visit OT Treatments $Self Care/Home Management : 23-37 mins  Delbert Phenix OT OT pager: [phone]   Rosemary Holms 07/10/2020, 11:26 AM

## 2020-07-11 DIAGNOSIS — U071 COVID-19: Secondary | ICD-10-CM | POA: Diagnosis not present

## 2020-07-11 DIAGNOSIS — J9601 Acute respiratory failure with hypoxia: Secondary | ICD-10-CM | POA: Diagnosis not present

## 2020-07-11 LAB — COMPREHENSIVE METABOLIC PANEL
ALT: 19 U/L (ref 0–44)
AST: 17 U/L (ref 15–41)
Albumin: 2.4 g/dL — ABNORMAL LOW (ref 3.5–5.0)
Alkaline Phosphatase: 62 U/L (ref 38–126)
Anion gap: 11 (ref 5–15)
BUN: 50 mg/dL — ABNORMAL HIGH (ref 8–23)
CO2: 27 mmol/L (ref 22–32)
Calcium: 9 mg/dL (ref 8.9–10.3)
Chloride: 108 mmol/L (ref 98–111)
Creatinine, Ser: 1.6 mg/dL — ABNORMAL HIGH (ref 0.61–1.24)
GFR, Estimated: 41 mL/min — ABNORMAL LOW (ref >60)
Glucose, Bld: 156 mg/dL — ABNORMAL HIGH (ref 70–99)
Potassium: 4.9 mmol/L (ref 3.5–5.1)
Sodium: 146 mmol/L — ABNORMAL HIGH (ref 135–145)
Total Bilirubin: 0.7 mg/dL (ref 0.3–1.2)
Total Protein: 5.8 g/dL — ABNORMAL LOW (ref 6.5–8.1)

## 2020-07-11 LAB — GLUCOSE, CAPILLARY
Glucose-Capillary: 143 mg/dL — ABNORMAL HIGH (ref 70–99)
Glucose-Capillary: 130 mg/dL — ABNORMAL HIGH (ref 70–99)
Glucose-Capillary: 144 mg/dL — ABNORMAL HIGH (ref 70–99)
Glucose-Capillary: 135 mg/dL — ABNORMAL HIGH (ref 70–99)

## 2020-07-11 LAB — CBC WITH DIFFERENTIAL/PLATELET
Abs Immature Granulocytes: 0.12 10*3/uL — ABNORMAL HIGH (ref 0.00–0.07)
Basophils Absolute: 0 10*3/uL (ref 0.0–0.1)
Basophils Relative: 0 %
Eosinophils Absolute: 0 10*3/uL (ref 0.0–0.5)
Eosinophils Relative: 0 %
HCT: 35.6 % — ABNORMAL LOW (ref 39.0–52.0)
Hemoglobin: 11.4 g/dL — ABNORMAL LOW (ref 13.0–17.0)
Immature Granulocytes: 1 %
Lymphocytes Relative: 6 %
Lymphs Abs: 0.5 10*3/uL — ABNORMAL LOW (ref 0.7–4.0)
MCH: 30.5 pg (ref 26.0–34.0)
MCHC: 32 g/dL (ref 30.0–36.0)
MCV: 95.2 fL (ref 80.0–100.0)
Monocytes Absolute: 0.2 10*3/uL (ref 0.1–1.0)
Monocytes Relative: 2 %
Neutro Abs: 8.4 10*3/uL — ABNORMAL HIGH (ref 1.7–7.7)
Neutrophils Relative %: 91 %
Platelets: 128 10*3/uL — ABNORMAL LOW (ref 150–400)
RBC: 3.74 MIL/uL — ABNORMAL LOW (ref 4.22–5.81)
RDW: 14.3 % (ref 11.5–15.5)
WBC: 9.2 10*3/uL (ref 4.0–10.5)
nRBC: 0 % (ref 0.0–0.2)

## 2020-07-11 LAB — MAGNESIUM: Magnesium: 2.6 mg/dL — ABNORMAL HIGH (ref 1.7–2.4)

## 2020-07-11 LAB — PHOSPHORUS: Phosphorus: 3.1 mg/dL (ref 2.5–4.6)

## 2020-07-11 LAB — FERRITIN: Ferritin: 589 ng/mL — ABNORMAL HIGH (ref 24–336)

## 2020-07-11 LAB — D-DIMER, QUANTITATIVE: D-Dimer, Quant: 2.04 ug/mL-FEU — ABNORMAL HIGH (ref 0.00–0.50)

## 2020-07-11 LAB — C-REACTIVE PROTEIN: CRP: 2.7 mg/dL — ABNORMAL HIGH (ref <1.0)

## 2020-07-11 MED ORDER — CEPHALEXIN 250 MG PO CAPS
250.0000 mg | ORAL_CAPSULE | Freq: Every day | ORAL | Status: DC
  Administered 2020-07-11 – 2020-07-12 (×2): 250 mg via ORAL
  Filled 2020-07-11 (×3): qty 1

## 2020-07-11 NOTE — NC FL2 (Addendum)
[place] LEVEL OF CARE SCREENING TOOL     IDENTIFICATION  Patient Name: Jordan Ward Birthdate: 1940-11-19 Sex: male Admission Date (Current Location): 07/03/2020  [facility] and [place] Number:  Herbalist and Address:  [facility],  [place], [place]      Provider Number: [id number]  Attending Physician Name and Address: Allie Bossier MD  Relative Name and Phone Number:  Jonavin, Seder (Spouse) [phone]    Current Level of Care: Hospital Recommended Level of Care: [place] Prior Approval Number:    Date Approved/Denied:   PASRR Number: [id number]  Discharge Plan: SNF    Current Diagnoses: Patient Active Problem List   Diagnosis Date Noted  . Acute hypoxemic respiratory failure due to COVID-19 ([place]) 07/03/2020  . Calculus of ureter 05/15/2020  . Bacteremia due to Gram-negative bacteria 05/04/2020  . Acute lower UTI 05/04/2020  . Acute unilateral obstructive uropathy   . SIRS (systemic inflammatory response syndrome) ([place]) 05/03/2020  . Hydronephrosis 05/02/2020  . Complication of cystostomy catheter, subsequent encounter 04/20/2020  . Acute kidney injury superimposed on CKD ([place]) 03/02/2020  . Dehydration 03/02/2020  . Nausea and vomiting 03/02/2020  . Ventral hernia without obstruction or gangrene 03/01/2020  . Hypotension 03/01/2020  . Poor appetite 03/01/2020  . Urinary tract infection 03/01/2020  . Detrusor areflexia 09/16/2019  . Cerebrovascular disease 08/17/2018  . Partial small bowel obstruction ([place]) 07/11/2017  . Choledocholithiasis 07/11/2017  . Complicated UTI (urinary tract infection)   . Sepsis secondary to UTI ([place]) 06/14/2017  . Paroxysmal atrial tachycardia ([place]) 06/14/2017  . Hypophosphatemia 06/14/2017  . Pressure injury of skin 05/26/2017  . Hydronephrosis due to obstruction of ureter 05/26/2017  . Obstructive uropathy 05/25/2017  . CKD (chronic kidney disease),  stage III ([place]) 08/23/2016  . Hydronephrosis of left kidney 08/23/2016  . Kidney stones 09/13/2015  . Essential hypertension   . Pressure ulcer 08/04/2015  . Chronic diastolic (congestive) heart failure ([place]) 08/04/2015  . Hydronephrosis with obstructing calculus 08/04/2015  . Occult blood positive stool 09/05/2014  . Rash and nonspecific skin eruption 08/25/2014  . Edema 08/19/2014  . Subacute confusional state 08/06/2014  . Dilated cardiomyopathy ([place]) 07/21/2014  . Sepsis ([place]) 07/17/2014  . Severe sepsis ([place]) 07/17/2014  . Headache 03/13/2014  . CVA (cerebral infarction) 03/13/2014  . Abnormality of gait 03/13/2014  . Tremors of nervous system 10/08/2011  . Hypothyroidism 10/08/2011  . Pulmonary nodule 10/08/2011  . Dysphagia 10/07/2011  . HTN (hypertension), malignant 10/06/2011  . Chronic suprapubic catheter ([place]) 10/06/2011  . Pulmonary fibrosis ([place]) 10/06/2011  . UTI (urinary tract infection) 10/03/2011  . PNA (pneumonia) 10/02/2011  . Junctional rhythm 10/02/2011  . Multiple sclerosis ([place]) 10/02/2011  . Chronic diarrhea 06/17/2011  . Hx of adenomatous colonic polyps 06/17/2011  . High grade dysplasia in colonic adenoma 09/29/2005    Orientation RESPIRATION BLADDER Height & Weight     Self, Situation, Place  O2 (2L [place]) Indwelling catheter Weight: 52.4 kg Height:  5\' 3"  (160 cm)  BEHAVIORAL SYMPTOMS/MOOD NEUROLOGICAL BOWEL NUTRITION STATUS   (none)  (none) Continent  (DYS III, nectar thick liquid)  AMBULATORY STATUS COMMUNICATION OF NEEDS Skin   Extensive Assist Verbally Other (Comment) (Pressure injury- Mid Sacrum, tip of L big toe)                       Personal Care Assistance Level of Assistance  Bathing, Feeding, Dressing Bathing Assistance: Maximum  assistance Feeding assistance: Limited assistance Dressing Assistance: Limited assistance     Functional Limitations Info  Sight, Hearing, Speech Sight Info: Adequate Hearing Info: Adequate Speech  Info: Impaired (difficult to understand)    [place]  PT (By licensed PT)     PT Frequency: 5X/W              Contractures Contractures Info: Not present    Additional Factors Info  Code Status, Allergies Code Status Info: Full Allergies Info: Tetracycline, Ciprofloxacin           Current Medications (07/11/2020):  This is the current hospital active medication list Current Facility-Administered Medications  Medication Dose Route Frequency Provider Last Rate Last Admin  . acetaminophen (TYLENOL) tablet 650 mg  650 mg Oral Q6H PRN Harold Hedge, MD   650 mg at 07/10/20 1019  . ascorbic acid (VITAMIN C) tablet 500 mg  500 mg Oral BID Elodia Florence., MD   500 mg at 07/11/20 1012  . baricitinib (OLUMIANT) tablet 2 mg  2 mg Oral Daily Eudelia Bunch, RPH   2 mg at 07/10/20 2207  . diazepam (VALIUM) tablet 5 mg  5 mg Oral Q12H PRN Harold Hedge, MD   5 mg at 07/11/20 1012  . feeding supplement (ENSURE ENLIVE) (ENSURE ENLIVE) liquid 237 mL  237 mL Oral TID BM Elodia Florence., MD   237 mL at 07/11/20 1252  . guaiFENesin-dextromethorphan (ROBITUSSIN DM) 100-10 MG/5ML syrup 10 mL  10 mL Oral Q4H PRN Harold Hedge, MD      . heparin injection 5,000 Units  5,000 Units Subcutaneous Q8H Harold Hedge, MD   5,000 Units at 07/11/20 1500  . HYDROcodone-acetaminophen (NORCO) 7.5-325 MG per tablet 1 tablet  1 tablet Oral BID Harold Hedge, MD   1 tablet at 07/11/20 1012  . insulin aspart (novoLOG) injection 0-5 Units  0-5 Units Subcutaneous QHS Elodia Florence., MD   2 Units at 07/05/20 2128  . insulin aspart (novoLOG) injection 0-9 Units  0-9 Units Subcutaneous TID WC Elodia Florence., MD   1 Units at 07/11/20 1252  . insulin aspart (novoLOG) injection 2 Units  2 Units Subcutaneous TID WC Elodia Florence., MD   2 Units at 07/11/20 1252  . Ipratropium-Albuterol (COMBIVENT) respimat 1 puff  1 puff Inhalation BID Elodia Florence.,  MD   1 puff at 07/11/20 0753  . Ipratropium-Albuterol (COMBIVENT) respimat 1 puff  1 puff Inhalation Q6H PRN Elodia Florence., MD   1 puff at 07/06/20 0827  . levothyroxine (SYNTHROID) tablet 75 mcg  75 mcg Oral QAC breakfast Harold Hedge, MD   75 mcg at 07/11/20 0511  . linagliptin (TRADJENTA) tablet 5 mg  5 mg Oral Daily Elodia Florence., MD   5 mg at 07/11/20 1012  . multivitamin with minerals tablet 1 tablet  1 tablet Oral Daily Elodia Florence., MD   1 tablet at 07/11/20 1012  . ondansetron (ZOFRAN) injection 4 mg  4 mg Intravenous Q6H PRN Elodia Florence., MD   4 mg at 07/11/20 0753  . predniSONE (DELTASONE) tablet 50 mg  50 mg Oral Daily Harold Hedge, MD   50 mg at 07/11/20 1012  . Resource ThickenUp Clear   Oral PRN Elodia Florence., MD      . sodium chloride flush (NS) 0.9 % injection 3 mL  3  mL Intravenous Q12H Harold Hedge, MD   3 mL at 07/11/20 1013     Discharge Medications: Please see discharge summary for a list of discharge medications.  Relevant Imaging Results:  Relevant Lab Results:   Additional Information [place], [place]

## 2020-07-11 NOTE — Progress Notes (Signed)
PROGRESS NOTE    Jordan Ward  IFO:[id number] DOB: 1940/12/03 DOA: 07/03/2020 PCP: Noreene Larsson, NP   Chief Complaint  Patient presents with   Weakness    Brief Narrative:  Per previous HPI Patient is Jordan Ward poor historian and much of the history is obtained from prior notes  This is Jordan Ward 79 year old male who has not been vaccinated against COVID-19 with Doneisha Ivey complex medical history including MS with suprapubic catheter and recurrent Proteus UTIs, HFpEF, recent left ureteroscopy with stone extraction and stent placement on 8/24, CKD 3b, vascular disease, hypertension, stroke with left-sided weakness who presented today to the ED from the monoclonal antibody infusion center with acute hypoxic respiratory failure.  He was diagnosed with COVID-19 on October 1 but is unsure of when his symptoms first started but started with Jordan Ward mild cough.  Wife is [name] positive sick contact.  He was referred to the infusion center and was noted to be desaturating to the 70s on room air and was transferred to the ED initially requiring 7 LPM to maintain sats greater than 90%.  No fever, chills, nausea, vomiting, diarrhea, rash.  ED Course: Afebrile, bradycardic (58), hypotensive (124/75 -> 89/61 ->95/53) requiring 6 LPM.  Notable labs: Sodium 147, glucose 111, BUN 55, creatinine 2.94 (previously 1.74 on 8/9, baseline 1.3), LDH, ferritin 804, CRP 12.4, procalcitonin negative, lactic acid 1.3, WBC 4.7, Hb 12, platelets 100, D-dimer 2.4, fibrinogen 431, COVID-19 positive.  CXR with RML and RLL ill-defined opacity.  He was admitted for COVID 19 pneumonia.  He's gradually improved with steroids, baricitinib (completed remdesivir).  He's frail and will need 24 hr supervision per therapy, but wife currently recovering from covid, unsure if they can provide this amount of care.  SW currently looking into SNF for rehab.  D/c pending final decision on d/c plan, further pt improvement.  Assessment & Plan:   Principal Problem:   Acute  hypoxemic respiratory failure due to COVID-19 [facility]) Active Problems:   Multiple sclerosis (HCC)   Hypothyroidism   Chronic diastolic (congestive) heart failure (HCC)   Essential hypertension   Cerebrovascular disease   Hypotension   Acute kidney injury superimposed on CKD ([place])  1. Acute hypoxic respiratory failure secondary to COVID-19   Possibly developing COVID encephalopathy [name]. Unvaccinated b. CXR with ill defined opacity R mid lung and R base, likely representing atypical organism pneumonia c. Follow repeat CXR 10/7 with mild infiltrate R upper lung and R mid lung, chronic interstitial lung disease d. CXR with coarse bibasilar reticular opacities e. Follow CT chest -> mild bronchiectasis in both lower lobes with probable peripheral honeycombing suggesting pulm fibrosis, mild bibasilar ill defined opacity concerning for subsegmental atelectasis (see report) f. Currently on RA (on 6 L at presentation) - wean as tolerated, stable today - desats with exertion g. Procalcitonin negative - hold off antibiotics for now h. Remdesivir x 5 days (10/5-10/9), Steroids x 10 days (10/5-present), baricitinib (10/5-present) (renal dosing) i. Strict I/O, daily weights j. Encourage Incentive Spirometry, Flutter Valve and Pronation as tolerated.  OOB.  Therapy.  COVID-19 Labs  Recent Labs    07/09/20 0431 07/10/20 0414 07/11/20 0423  DDIMER 1.76* 1.65* 2.04*  FERRITIN 672* 585* 589*  CRP 4.6* 3.1* 2.7*    Lab Results  Component Value Date   SARSCOV2NAA Detected (Jameon Deller) 06/29/2020   [place] NEGATIVE 05/21/2020   [place] NEGATIVE 05/02/2020   [place] NEGATIVE 04/18/2020   2. Elevated D dimer 1. Likely 2/2 covid 19 infection - will follow  LE [place] (negative for DVT) 2. Trend, consider additional imaging as needed, elevated d dimer likely 2/2 inflammation from covd  3. Hypernatremia: urine osms (438).  Strict I/o.  Follow.  Improved with D5.  Continue to monitor off  IVFs.  4. Multiple sclerosis with neurogenic bladder and suprapubic catheter  Perley Arthurs. Not on any Biologics or immunomodulators b. Continue home pain regimen: Norco and Valium c. PT  3. Compensated HFpEF Pius Byrom. Holding home Coreg due to hypotension  4. AKI on CKD 3b   Left Sided Hydronephrosis Jaime Grizzell. Baseline creatinine 1.3-1.5, peaked at 2.94 b. Improving today, continue to follow c. Follow UA -> appears c/w UTI (see below) d. Follow renal US -> left sided hydro, discussed with urology who will eval -> recommending outpatient follow up and suppressive antibiotics when ready for discharge (discussed with Dr. Junious Silk)  5. Asymptomatic Bacteruria   Suprapubic Catheter 1. UA with +nitrite and LE, hematuria  2. He's asymptomatic from this, will follow urine cx 3. Continue ceftriaxone x5days (in setting of hydro and treatment with steroids/baricitinib), follow final cx (multiple species) 4. Keflex for suppressive abx  6. Hypertension now with hypotension Jabria Loos. BP improved b. Hold home antihypertensives (coreg, amlodipine)  6. Hypothyroidism Melisia Leming. Continue home Synthroid  7. History of stroke with left-sided weakness  8. Hypokalemia   Hypophosphatemia: replace and follow  9. Prediabetes   Hyperglycemia 2/2 Steroids: follow A1c (6.3), SSI  10. Thrombocytopenia: mild, some degree of chronicity  11. Goals of care: frail with slow to limited improvement since admission, will consult palliative care for [place] discussion.  Discussed with wife.   DVT prophylaxis: heparin Code Status: full  Family Communication: none at bedside - 10/13 wife Disposition:   Status is: Inpatient  Remains inpatient appropriate because:Inpatient level of care appropriate due to severity of illness   Dispo: The patient is from: Home              Anticipated d/c is to: pending              Anticipated d/c date is: > 3 days              Patient currently is not medically stable to d/c.       Consultants:    none  Procedures:   none  Antimicrobials:  Anti-infectives (From admission, onward)   Start     Dose/Rate Route Frequency Ordered Stop   07/06/20 1000  cefTRIAXone (ROCEPHIN) 1 g in sodium chloride 0.9 % 100 mL IVPB        1 g 200 mL/hr over 30 Minutes Intravenous Every 24 hours 07/06/20 0904 07/10/20 1215   07/04/20 1000  remdesivir 100 mg in sodium chloride 0.9 % 100 mL IVPB  Status:  Discontinued       "Followed by" Linked Group Details   100 mg 200 mL/hr over 30 Minutes Intravenous Every 24 hours 07/03/20 1559 07/03/20 1845   07/04/20 1000  remdesivir 100 mg in sodium chloride 0.9 % 100 mL IVPB  Status:  Discontinued       "Followed by" Linked Group Details   100 mg 200 mL/hr over 30 Minutes Intravenous Daily 07/03/20 1845 07/03/20 1905   07/04/20 1000  remdesivir 100 mg in sodium chloride 0.9 % 100 mL IVPB        100 mg 200 mL/hr over 30 Minutes Intravenous Every 24 hours 07/03/20 2008 07/07/20 1008   07/03/20 1845  remdesivir 200 mg in sodium chloride 0.9% 250 mL IVPB  Status:  Discontinued       "Followed by" Linked Group Details   200 mg 580 mL/hr over 30 Minutes Intravenous Once 07/03/20 1845 07/03/20 1905   07/03/20 1700  remdesivir 200 mg in sodium chloride 0.9% 250 mL IVPB       "Followed by" Linked Group Details   200 mg 580 mL/hr over 30 Minutes Intravenous Once 07/03/20 1559 07/03/20 1800     Subjective: No new complaints  Objective: Vitals:   07/10/20 2050 07/11/20 0454 07/11/20 0900 07/11/20 1317  BP: 105/61 136/79  128/72  Pulse: 65 (!) 56  72  Resp: 19 20  16   Temp: 97.7 F (36.5 C) 97.7 F (36.5 C)  97.6 F (36.4 C)  TempSrc:    Oral  SpO2: 96% 95% 90%   Weight:  52.4 kg    Height:        Intake/Output Summary (Last 24 hours) at 07/11/2020 1738 Last data filed at 07/11/2020 0508 Gross per 24 hour  Intake 354 ml  Output 2075 ml  Net -1721 ml   Filed Weights   07/09/20 0344 07/10/20 0500 07/11/20 0454  Weight: 52.1 kg 52 kg 52.4 kg     Examination:  General: No acute distress. Cardiovascular: Heart sounds show Iolani Twilley regular rate, and rhythm. Lungs: Clear to auscultation bilaterally Abdomen: Soft, nontender, nondistended  Neurological: Alert and oriented 3. Moves all extremities 4. Cranial nerves II through XII grossly intact. Skin: Warm and dry. No rashes or lesions. Extremities: No clubbing or cyanosis. No edema.  Data Reviewed: I have personally reviewed following labs and imaging studies  CBC: Recent Labs  Lab 07/07/20 0300 07/08/20 0414 07/09/20 0431 07/10/20 0414 07/11/20 0423  WBC 8.1 8.8 10.5 6.2 9.2  NEUTROABS 6.9 7.5 9.1* 5.3 8.4*  HGB 11.1* 11.7* 11.6* 10.4* 11.4*  HCT 34.7* 36.5* 36.2* 33.2* 35.6*  MCV 95.1 96.3 95.0 97.6 95.2  PLT 122* 121* 127* 109* 128*    Basic Metabolic Panel: Recent Labs  Lab 07/06/20 0556 07/06/20 0556 07/07/20 0300 07/08/20 0414 07/09/20 0431 07/10/20 0414 07/11/20 0423  NA 147*   < > 153* 148* 144 143 146*  K 3.1*   < > 3.2* 3.5 4.3 5.0 4.9  CL 116*   < > 114* 109 107 109 108  CO2 20*   < > 26 28 26 26 27   GLUCOSE 213*   < > 189* 185* 176* 144* 156*  BUN 63*   < > 59* 52* 41* 40* 50*  CREATININE 1.69*   < > 1.60* 1.48* 1.29* 1.40* 1.60*  CALCIUM 8.5*   < > 8.7* 8.5* 8.7* 8.2* 9.0  MG 2.2  --   --   --  2.0 2.2 2.6*  PHOS 1.3*  --  2.1*  --  2.9 4.2 3.1   < > = values in this interval not displayed.    GFR: Estimated Creatinine Clearance: 28.7 mL/min (Sufyan Meidinger) (by C-G formula based on SCr of 1.6 mg/dL (H)).  Liver Function Tests: Recent Labs  Lab 07/07/20 0300 07/08/20 0414 07/09/20 0431 07/10/20 0414 07/11/20 0423  AST 42* 28 24 19 17   ALT 28 28 24 19 19   ALKPHOS 60 64 62 62 62  BILITOT 0.7 0.7 0.5 0.4 0.7  PROT 6.3* 6.5 6.1* 5.4* 5.8*  ALBUMIN 2.8* 2.8* 2.5* 2.3* 2.4*    CBG: Recent Labs  Lab 07/10/20 1701 07/10/20 2052 07/11/20 0726 07/11/20 1118 07/11/20 1620  GLUCAP 151* 163* 143* 130* 144*  Recent Results (from the past 240  hour(s))  Blood Culture (routine x 2)     Status: None   Collection Time: 07/03/20  2:01 PM   Specimen: BLOOD  Result Value Ref Range Status   Specimen Description   Final    BLOOD LEFT ANTECUBITAL Performed at [place] [address] [place], [place] [address]    Special Requests   Final    BOTTLES DRAWN AEROBIC AND ANAEROBIC Blood Culture results may not be optimal due to an inadequate volume of blood received in culture bottles Performed at [facility] [address], [place], [place] [address]    Culture   Final    NO GROWTH 5 DAYS Performed at [facility], [place] [address], [place], [place] [address]    Report Status 07/08/2020 FINAL  Final  Culture, Urine     Status: Abnormal   Collection Time: 07/04/20  7:56 PM   Specimen: Urine, Clean Catch  Result Value Ref Range Status   Specimen Description   Final    URINE, CLEAN CATCH Performed at [facility], [place] [address], [place], [place] [address]    Special Requests   Final    NONE Performed at [facility], [place] [address] [place], [place] [address]    Culture MULTIPLE SPECIES PRESENT, SUGGEST RECOLLECTION (Betsaida [place])  Final   Report Status 07/06/2020 FINAL  Final         Radiology Studies: CT CHEST WO CONTRAST  Result Date: 07/10/2020 CLINICAL DATA:  Chest pain. EXAM: CT CHEST WITHOUT CONTRAST TECHNIQUE: Multidetector CT imaging of the chest was performed following the standard protocol without IV contrast. COMPARISON:  January 05, 2012. FINDINGS: Cardiovascular: Atherosclerosis of thoracic aorta is noted. Stable focal aneurysmal bulge is seen involving the lateral aspect of the transverse aortic arch. Normal cardiac size. No pericardial effusion. Coronary artery calcifications are noted. Mediastinum/Nodes: No enlarged mediastinal or axillary lymph nodes. Thyroid gland, trachea, and esophagus demonstrate no significant findings. Lungs/Pleura:  No pneumothorax or pleural effusion is noted. Mild bronchiectasis is noted in both lower lobes with probable peripheral honeycombing seen in the lung bases suggesting pulmonary fibrosis. There appears to be mild bibasilar ill-defined opacity is noted concerning for subsegmental atelectasis or possibly infiltrates. Upper Abdomen: Left hepatic pneumobilia is noted. This may be due to recent instrumentation or incompetent sphincter of Oddi. Severe right renal atrophy is noted. Left renal cysts are noted. Musculoskeletal: No chest Britain mass or suspicious bone lesions identified. IMPRESSION: 1. Coronary artery calcifications are noted suggesting coronary artery disease. 2. Stable focal aneurysmal bulge is seen involving the lateral aspect of the transverse aortic arch. 3. Mild bronchiectasis is noted in both lower lobes with probable peripheral honeycombing seen in the lung bases suggesting pulmonary fibrosis. 4. There appears to be mild bibasilar ill-defined opacity concerning for subsegmental atelectasis or possibly infiltrates. 5. Severe right renal atrophy is noted. 6. Left hepatic pneumobilia is noted. This may be due to recent instrumentation or incompetent sphincter of Oddi. 7. Aortic atherosclerosis. Aortic Atherosclerosis (ICD10-I70.0). Electronically Signed   By: Marijo Conception M.D.   On: 07/10/2020 17:59        Scheduled Meds:  vitamin C  500 mg Oral BID   baricitinib  2 mg Oral Daily   feeding supplement  237 mL Oral TID BM   heparin  5,000 Units Subcutaneous Q8H   HYDROcodone-acetaminophen  1 tablet Oral BID   insulin aspart  0-5  Units Subcutaneous QHS   insulin aspart  0-9 Units Subcutaneous TID WC   insulin aspart  2 Units Subcutaneous TID WC   Ipratropium-Albuterol  1 puff Inhalation BID   levothyroxine  75 mcg Oral QAC breakfast   linagliptin  5 mg Oral Daily   multivitamin with minerals  1 tablet Oral Daily   predniSONE  50 mg Oral Daily   sodium chloride flush  3 mL  Intravenous Q12H   Continuous Infusions:    LOS: 8 days    Time spent: over 30 min    Fayrene Helper, MD [facility]   To contact the attending provider between 7A-7P or the covering provider during after hours 7P-7A, please log into the web site [email or web address] and access using universal [place] password for that web site. If you do not have the password, please call the hospital operator.  07/11/2020, 5:38 PM

## 2020-07-11 NOTE — TOC Progression Note (Signed)
Transition of Care [facility]) - Progression Note    Patient Details  Name: Jordan Ward MRN: [id number] Date of Birth: 04-04-1941  Transition of Care [facility]) CM/SW [place], [place] Phone Number: 07/11/2020, 3:56 PM  Clinical Narrative:   Spoke with wife and daughter.  They are feeling uneasy about caring for patient as his needs appear to be increasing and both are recovering from [place] as well.  They are interested in seeing if he can get into a SNF for short term rehab.  Wife will talk to him in the AM about it.  In the meantime, I will initiate a bed search. TOC will continue to follow during the course of hospitalization.     Expected Discharge Plan: [place] Barriers to Discharge: No Barriers Identified  Expected Discharge Plan and Services Expected Discharge Plan: [place]   Discharge Planning Services: CM Consult Post Acute Care Choice: Home Health                                         Social Determinants of Health (SDOH) Interventions    Readmission Risk Interventions No flowsheet data found.

## 2020-07-12 DIAGNOSIS — U071 COVID-19: Secondary | ICD-10-CM | POA: Diagnosis not present

## 2020-07-12 DIAGNOSIS — J1282 Pneumonia due to coronavirus disease 2019: Secondary | ICD-10-CM | POA: Diagnosis present

## 2020-07-12 DIAGNOSIS — I5032 Chronic diastolic (congestive) heart failure: Secondary | ICD-10-CM | POA: Diagnosis not present

## 2020-07-12 DIAGNOSIS — R0602 Shortness of breath: Secondary | ICD-10-CM

## 2020-07-12 DIAGNOSIS — E038 Other specified hypothyroidism: Secondary | ICD-10-CM

## 2020-07-12 DIAGNOSIS — J9601 Acute respiratory failure with hypoxia: Secondary | ICD-10-CM | POA: Diagnosis present

## 2020-07-12 DIAGNOSIS — I10 Essential (primary) hypertension: Secondary | ICD-10-CM | POA: Diagnosis not present

## 2020-07-12 DIAGNOSIS — G9349 Other encephalopathy: Secondary | ICD-10-CM | POA: Diagnosis present

## 2020-07-12 LAB — CBC WITH DIFFERENTIAL/PLATELET
Abs Immature Granulocytes: 0.14 10*3/uL — ABNORMAL HIGH (ref 0.00–0.07)
Basophils Absolute: 0 10*3/uL (ref 0.0–0.1)
Basophils Relative: 0 %
Eosinophils Absolute: 0 10*3/uL (ref 0.0–0.5)
Eosinophils Relative: 0 %
HCT: 33.2 % — ABNORMAL LOW (ref 39.0–52.0)
Hemoglobin: 10.5 g/dL — ABNORMAL LOW (ref 13.0–17.0)
Immature Granulocytes: 1 %
Lymphocytes Relative: 5 %
Lymphs Abs: 0.5 10*3/uL — ABNORMAL LOW (ref 0.7–4.0)
MCH: 31.2 pg (ref 26.0–34.0)
MCHC: 31.6 g/dL (ref 30.0–36.0)
MCV: 98.5 fL (ref 80.0–100.0)
Monocytes Absolute: 0.3 10*3/uL (ref 0.1–1.0)
Monocytes Relative: 3 %
Neutro Abs: 8.8 10*3/uL — ABNORMAL HIGH (ref 1.7–7.7)
Neutrophils Relative %: 91 %
Platelets: 124 10*3/uL — ABNORMAL LOW (ref 150–400)
RBC: 3.37 MIL/uL — ABNORMAL LOW (ref 4.22–5.81)
RDW: 14.7 % (ref 11.5–15.5)
WBC: 9.7 10*3/uL (ref 4.0–10.5)
nRBC: 0 % (ref 0.0–0.2)

## 2020-07-12 LAB — MAGNESIUM: Magnesium: 2.6 mg/dL — ABNORMAL HIGH (ref 1.7–2.4)

## 2020-07-12 LAB — COMPREHENSIVE METABOLIC PANEL
ALT: 21 U/L (ref 0–44)
AST: 16 U/L (ref 15–41)
Albumin: 2.3 g/dL — ABNORMAL LOW (ref 3.5–5.0)
Alkaline Phosphatase: 58 U/L (ref 38–126)
Anion gap: 9 (ref 5–15)
BUN: 55 mg/dL — ABNORMAL HIGH (ref 8–23)
CO2: 25 mmol/L (ref 22–32)
Calcium: 8.6 mg/dL — ABNORMAL LOW (ref 8.9–10.3)
Chloride: 111 mmol/L (ref 98–111)
Creatinine, Ser: 1.64 mg/dL — ABNORMAL HIGH (ref 0.61–1.24)
GFR, Estimated: 40 mL/min — ABNORMAL LOW (ref >60)
Glucose, Bld: 164 mg/dL — ABNORMAL HIGH (ref 70–99)
Potassium: 5.1 mmol/L (ref 3.5–5.1)
Sodium: 145 mmol/L (ref 135–145)
Total Bilirubin: 0.5 mg/dL (ref 0.3–1.2)
Total Protein: 5.4 g/dL — ABNORMAL LOW (ref 6.5–8.1)

## 2020-07-12 LAB — GLUCOSE, CAPILLARY
Glucose-Capillary: 234 mg/dL — ABNORMAL HIGH (ref 70–99)
Glucose-Capillary: 106 mg/dL — ABNORMAL HIGH (ref 70–99)
Glucose-Capillary: 144 mg/dL — ABNORMAL HIGH (ref 70–99)
Glucose-Capillary: 134 mg/dL — ABNORMAL HIGH (ref 70–99)

## 2020-07-12 LAB — FERRITIN: Ferritin: 480 ng/mL — ABNORMAL HIGH (ref 24–336)

## 2020-07-12 LAB — C-REACTIVE PROTEIN: CRP: 1.3 mg/dL — ABNORMAL HIGH (ref <1.0)

## 2020-07-12 LAB — SARS CORONAVIRUS 2 BY RT PCR (HOSPITAL ORDER, PERFORMED IN ~~LOC~~ HOSPITAL LAB): SARS Coronavirus 2: POSITIVE — AB

## 2020-07-12 LAB — PHOSPHORUS: Phosphorus: 3.5 mg/dL (ref 2.5–4.6)

## 2020-07-12 LAB — D-DIMER, QUANTITATIVE: D-Dimer, Quant: 1.9 ug/mL-FEU — ABNORMAL HIGH (ref 0.00–0.50)

## 2020-07-12 NOTE — Progress Notes (Addendum)
Occupational Therapy Treatment Patient Details Name: Jordan Ward MRN: [id number] DOB: July 16, 1941 Today's Date: 07/12/2020    History of present illness 79 yo male admitted with [place]. Hx of MS, HF, CKD, CVA with L sided weakness, CAD, gait d/o, PVD, neurogenic bladder   OT comments  Treatment focused on improving independence with ADLs. Patient able to perform toileting, toileting transfer and standing grooming task with min guard needing UE support due to poor balance. Patient continues to report weakness and feeling "crummy" compared to baseline. Patient's o2 sat dropped to 80% on 2L with ambulation to bathroom and then to 85% on return to bed. Due to patient's continued weakness, decreased cardiopulmonary endurance and impaired balance patient would benefit from short term rehab at discharge in order to return to baseline abilities. Patient reports his wife is sick with covid as well.   Follow Up Recommendations  Home health OT;Supervision/Assistance - 24 hour;SNF    Equipment Recommendations  None recommended by OT    Recommendations for Other Services      Precautions / Restrictions Precautions Precautions: Fall Precaution Comments: monitor O2, supropubic cath Restrictions Weight Bearing Restrictions: No       Mobility Bed Mobility Overal bed mobility: Needs Assistance Bed Mobility: Supine to Sit;Sit to Supine     Supine to sit: Min guard;HOB elevated Sit to supine: Min assist   General bed mobility comments: min assist for LE to return back to supine  Transfers Overall transfer level: Needs assistance Equipment used: Rolling walker (2 wheeled) Transfers: Sit to/from Stand Sit to Stand: Min guard Stand pivot transfers: Min guard       General transfer comment: Min guard to ambulate to bathroom with RW. Slow gait but without loss of balance    Balance Overall balance assessment: Needs assistance Sitting-balance support: No upper extremity supported;Feet  supported Sitting balance-Leahy Scale: Fair     Standing balance support: Bilateral upper extremity supported;During functional activity Standing balance-Leahy Scale: Poor Standing balance comment: leans heavily onto sink when brushing teeth                           ADL either performed or assessed with clinical judgement   ADL       Grooming: Wash/dry hands;Standing Grooming Details (indicate cue type and reason): Patient washed hands standing at the sink. Patient leaning forward against sink for stability                 Toilet Transfer: Min guard;RW;Grab Information systems manager Details (indicate cue type and reason): Patient able to perform toilet transfer with use of grab bar with increased time. Toileting- Water quality scientist and Hygiene: Min guard;Sit to/from stand Toileting - Clothing Manipulation Details (indicate cue type and reason): min guard with toileting. Patient required increased time and stabilized himself on walker/grab bar but able to manage clothing and perform pericare.     Functional mobility during ADLs: Min guard;Rolling walker       Vision Patient Visual Report: No change from baseline     Perception     Praxis      Cognition Arousal/Alertness: Awake/alert Behavior During Therapy: WFL for tasks assessed/performed;Flat affect Overall Cognitive Status: Within Functional Limits for tasks assessed                                 General Comments: very slow to respond at times-likely 2*  MS        Exercises     Shoulder Instructions       General Comments      Pertinent Vitals/ Pain       Pain Assessment: No/denies pain  Home Living                                          Prior Functioning/Environment              Frequency  Min 2X/week        Progress Toward Goals  OT Goals(current goals can now be found in the care plan section)  Progress towards OT goals:  Progressing toward goals  Acute Rehab OT Goals Patient Stated Goal: home OT Goal Formulation: With patient Time For Goal Achievement: 07/19/20 Potential to Achieve Goals: Good  Plan Discharge plan needs to be updated    Co-evaluation                 AM-PAC OT "6 Clicks" Daily Activity     Outcome Measure   Help from another person eating meals?: None Help from another person taking care of personal grooming?: A Little Help from another person toileting, which includes using toliet, bedpan, or urinal?: A Little Help from another person bathing (including washing, rinsing, drying)?: A Little Help from another person to put on and taking off regular upper body clothing?: A Little Help from another person to put on and taking off regular lower body clothing?: A Little 6 Click Score: 19    End of Session Equipment Utilized During Treatment: Rolling walker;Oxygen  OT Visit Diagnosis: Unsteadiness on feet (R26.81);Other abnormalities of gait and mobility (R26.89)   Activity Tolerance Patient tolerated treatment well   Patient Left with call bell/phone within reach;in bed;with bed alarm set   Nurse Communication  (okay to see per rn)        Time: 4497-5300 OT Time Calculation (min): 19 min  Charges: OT General Charges $OT Visit: 1 Visit OT Treatments $Self Care/Home Management : 8-22 mins  Derl Barrow, OTR/L [place]  Office [phone] Pager: [place] 07/12/2020, 4:18 PM

## 2020-07-12 NOTE — Progress Notes (Signed)
SATURATION QUALIFICATIONS: (This note is used to comply with regulatory documentation for home oxygen)  Patient Saturations on Room Air at Rest = 80%  Patient Saturations on 2 Liters of oxygen at rest = 93%  Patient Saturations on 2 Liters of oxygen while Ambulating = 82%

## 2020-07-12 NOTE — Progress Notes (Signed)
PROGRESS NOTE    Jordan Ward  JYN:[id number] DOB: 1941/05/17 DOA: 07/03/2020 PCP: Noreene Larsson, NP     Brief Narrative:  79 year old WM PMHx WM Stroke with LEFT sided weakness MS with suprapubic catheter and recurrent Proteus UTIs, Chronic Diastolic CHF, HTN, recent left ureteroscopy with stone extraction and stent placement on 8/24, CKD3b, vascular disease, hypertension,   who presented today to the ED from the monoclonal antibody infusion center with acute hypoxic respiratory failure. He was diagnosed with COVID-19 on October 1 but is unsure of when his symptoms first started but started with a mild cough. Wife is a positive sick contact. He was referred to the infusion center and was noted to be desaturating to the 70s on room air and was transferred to the ED initially requiring 7 LPM to maintain sats greater than 90%. No fever, chills, nausea, vomiting, diarrhea, rash.  ED Course:Afebrile, bradycardic (58), hypotensive (124/75->89/61->95/53) requiring 6 LPM. Notable labs: Sodium 147, glucose 111, BUN 55, creatinine 2.94 (previously 1.74 on 8/9, baseline 1.3), LDH, ferritin 804, CRP 12.4, procalcitonin negative, lactic acid 1.3, WBC 4.7, Hb 12, platelets 100, D-dimer 2.4, fibrinogen 431, COVID-19 positive. CXR with RML and RLL ill-defined opacity.  He was admitted for COVID 19 pneumonia.  He's gradually improved with steroids, baricitinib (completed remdesivir).  He's frail and will need 24 hr supervision per therapy, but wife currently recovering from covid, unsure if they can provide this amount of care.  SW currently looking into SNF for rehab.  D/c pending final decision on d/c plan, further pt improvement   Subjective: A/O x4, states just does not feel well.  States ambulated with staff however ambulatory SPO2 not in chart currently.   Assessment & Plan: Covid vaccination; not vaccinated   Principal Problem:   Acute hypoxemic respiratory failure due to COVID-19  [facility]) Active Problems:   Multiple sclerosis (HCC)   Hypothyroidism   Chronic diastolic (congestive) heart failure (HCC)   Essential hypertension   Cerebrovascular disease   Hypotension   Acute kidney injury superimposed on CKD ([place])   Acute respiratory failure with hypoxia (HCC)   Pneumonia due to COVID-19 virus   Encephalopathy due to COVID-19 virus  Acute respiratory failure with hypoxia/Covid pneumonia COVID-19 Labs  Recent Labs    07/10/20 0414 07/11/20 0423 07/12/20 0426  DDIMER 1.65* 2.04* 1.90*  FERRITIN 585* 589* 480*  CRP 3.1* 2.7* 1.3*    Lab Results  Component Value Date   SARSCOV2NAA Detected (A) 06/29/2020   [place] NEGATIVE 05/21/2020   [place] NEGATIVE 05/02/2020   [place] NEGATIVE 04/18/2020  -Baricitinib per pharmacy protocol -Prednisone 50 mg daily -Complete course Remdesivir -Incentive spirometry -Flutter valve -Respimat BID -Titrate O2 to maintain SPO2> 88%  Covid encephalopathy -Patient appeared alert and oriented today however do not know patient's baseline, stated he had ambulated with staff and performed ambulatory SPO2 however no documentation that this occurred. -Patient may still be experiencing some mild confusion  Chronic diastolic CHF -Strict in and out -Daily weight  Essential HTN -See CHF  Hyperglycemia -NovoLog 2 unitsqac -NovoLog sensitive SSI -Tradjenta 5 mg daily  Hypothyroidism -Synthroid 75 mcg daily   DVT prophylaxis: Subcu heparin Code Status: Full Family Communication:  Status is: Inpatient    Dispo: The patient is from: Home              Anticipated d/c is to: SNF              Anticipated d/c date is: 10/15  Patient currently stable      Consultants:    Procedures/Significant Events:    I have personally reviewed and interpreted all radiology studies and my findings are as above.  VENTILATOR SETTINGS: Nasal cannula 10/14 -Flow; 2 L/min -SPO2 90%   Cultures 10/1  SARS coronavirus positive   Antimicrobials: Anti-infectives (From admission, onward)   Start     Ordered Stop   07/11/20 2200  cephALEXin (KEFLEX) capsule 250 mg        07/11/20 1935     07/06/20 1000  cefTRIAXone (ROCEPHIN) 1 g in sodium chloride 0.9 % 100 mL IVPB        07/06/20 0904 07/10/20 1215   07/04/20 1000  remdesivir 100 mg in sodium chloride 0.9 % 100 mL IVPB  Status:  Discontinued       "Followed by" Linked Group Details   07/03/20 1559 07/03/20 1845   07/04/20 1000  remdesivir 100 mg in sodium chloride 0.9 % 100 mL IVPB  Status:  Discontinued       "Followed by" Linked Group Details   07/03/20 1845 07/03/20 1905   07/04/20 1000  remdesivir 100 mg in sodium chloride 0.9 % 100 mL IVPB        07/03/20 2008 07/07/20 1008   07/03/20 1845  remdesivir 200 mg in sodium chloride 0.9% 250 mL IVPB  Status:  Discontinued       "Followed by" Linked Group Details   07/03/20 1845 07/03/20 1905   07/03/20 1700  remdesivir 200 mg in sodium chloride 0.9% 250 mL IVPB       "Followed by" Linked Group Details   07/03/20 1559 07/03/20 1800       Devices    LINES / TUBES:      Continuous Infusions:   Objective: Vitals:   07/11/20 2017 07/12/20 0428 07/12/20 0500 07/12/20 1341  BP: (!) 148/72 (!) 144/78  139/77  Pulse: 67 72  74  Resp: (!) 22 17  18   Temp: 97.6 F (36.4 C) 97.9 F (36.6 C)  98.1 F (36.7 C)  TempSrc: Oral Oral  Oral  SpO2: 97% 93%  90%  Weight:   52.1 kg   Height:        Intake/Output Summary (Last 24 hours) at 07/12/2020 1752 Last data filed at 07/12/2020 1346 Gross per 24 hour  Intake 708 ml  Output 1300 ml  Net -592 ml   Filed Weights   07/10/20 0500 07/11/20 0454 07/12/20 0500  Weight: 52 kg 52.4 kg 52.1 kg    Examination:  General: A/O x4, positive acute respiratory distress, cachectic Eyes: negative scleral hemorrhage, negative anisocoria, negative icterus ENT: Negative Runny nose, negative gingival bleeding, Neck:  Negative scars,  masses, torticollis, lymphadenopathy, JVD Lungs: creased breath sounds bilaterally without wheezes or crackles Cardiovascular: Regular rate and rhythm without murmur gallop or rub normal S1 and S2 Abdomen: negative abdominal pain, nondistended, positive soft, bowel sounds, no rebound, no ascites, no appreciable mass Extremities: No significant cyanosis, clubbing, or edema bilateral lower extremities Skin: Negative rashes, lesions, ulcers Psychiatric:  Negative depression, negative anxiety, negative fatigue, negative mania  Central nervous system:  Cranial nerves II through XII intact, tongue/uvula midline, all extremities muscle strength 5/5, sensation intact throughout, negative dysarthria, negative expressive aphasia, negative receptive aphasia.  .     Data Reviewed: Care during the described time interval was provided by me .  I have reviewed this patient's available data, including medical history, events of note, physical examination, and all  test results as part of my evaluation.  CBC: Recent Labs  Lab 07/08/20 0414 07/09/20 0431 07/10/20 0414 07/11/20 0423 07/12/20 0426  WBC 8.8 10.5 6.2 9.2 9.7  NEUTROABS 7.5 9.1* 5.3 8.4* 8.8*  HGB 11.7* 11.6* 10.4* 11.4* 10.5*  HCT 36.5* 36.2* 33.2* 35.6* 33.2*  MCV 96.3 95.0 97.6 95.2 98.5  PLT 121* 127* 109* 128* 161*   Basic Metabolic Panel: Recent Labs  Lab 07/06/20 0556 07/06/20 0556 07/07/20 0300 07/07/20 0300 07/08/20 0414 07/09/20 0431 07/10/20 0414 07/11/20 0423 07/12/20 0426  NA 147*   < > 153*   < > 148* 144 143 146* 145  K 3.1*   < > 3.2*   < > 3.5 4.3 5.0 4.9 5.1  CL 116*   < > 114*   < > 109 107 109 108 111  CO2 20*   < > 26   < > 28 26 26 27 25   GLUCOSE 213*   < > 189*   < > 185* 176* 144* 156* 164*  BUN 63*   < > 59*   < > 52* 41* 40* 50* 55*  CREATININE 1.69*   < > 1.60*   < > 1.48* 1.29* 1.40* 1.60* 1.64*  CALCIUM 8.5*   < > 8.7*   < > 8.5* 8.7* 8.2* 9.0 8.6*  MG 2.2  --   --   --   --  2.0 2.2 2.6* 2.6*    PHOS 1.3*   < > 2.1*  --   --  2.9 4.2 3.1 3.5   < > = values in this interval not displayed.   GFR: Estimated Creatinine Clearance: 27.8 mL/min (A) (by C-G formula based on SCr of 1.64 mg/dL (H)). Liver Function Tests: Recent Labs  Lab 07/08/20 0414 07/09/20 0431 07/10/20 0414 07/11/20 0423 07/12/20 0426  AST 28 24 19 17 16   ALT 28 24 19 19 21   ALKPHOS 64 62 62 62 58  BILITOT 0.7 0.5 0.4 0.7 0.5  PROT 6.5 6.1* 5.4* 5.8* 5.4*  ALBUMIN 2.8* 2.5* 2.3* 2.4* 2.3*   No results for input(s): LIPASE, AMYLASE in the last 168 hours. No results for input(s): AMMONIA in the last 168 hours. Coagulation Profile: No results for input(s): INR, PROTIME in the last 168 hours. Cardiac Enzymes: No results for input(s): CKTOTAL, CKMB, CKMBINDEX, TROPONINI in the last 168 hours. BNP (last 3 results) No results for input(s): PROBNP in the last 8760 hours. HbA1C: No results for input(s): HGBA1C in the last 72 hours. CBG: Recent Labs  Lab 07/11/20 1620 07/11/20 2108 07/12/20 0729 07/12/20 1122 07/12/20 1614  GLUCAP 144* 135* 234* 106* 144*   Lipid Profile: No results for input(s): CHOL, HDL, LDLCALC, TRIG, CHOLHDL, LDLDIRECT in the last 72 hours. Thyroid Function Tests: No results for input(s): TSH, T4TOTAL, FREET4, T3FREE, THYROIDAB in the last 72 hours. Anemia Panel: Recent Labs    07/11/20 0423 07/12/20 0426  FERRITIN 589* 480*   Sepsis Labs: No results for input(s): PROCALCITON, LATICACIDVEN in the last 168 hours.  Recent Results (from the past 240 hour(s))  Blood Culture (routine x 2)     Status: None   Collection Time: 07/03/20  2:01 PM   Specimen: BLOOD  Result Value Ref Range Status   Specimen Description   Final    BLOOD LEFT ANTECUBITAL Performed at [facility] [address], [place], [place] [address]    Special Requests   Final    BOTTLES DRAWN AEROBIC AND ANAEROBIC  Blood Culture results may not be optimal due to an inadequate volume of  blood received in culture bottles Performed at [facility] [address], [place], [place] [address]    Culture   Final    NO GROWTH 5 DAYS Performed at [facility], [place] [address] [place], [place] [address]    Report Status 07/08/2020 FINAL  Final  Culture, Urine     Status: Abnormal   Collection Time: 07/04/20  7:56 PM   Specimen: Urine, Clean Catch  Result Value Ref Range Status   Specimen Description   Final    URINE, CLEAN CATCH Performed at [facility], [place] [address], [place], [place] [address]    Special Requests   Final    NONE Performed at [facility], [place] [address], [place], [place] [address]    Culture MULTIPLE SPECIES PRESENT, SUGGEST RECOLLECTION (A)  Final   Report Status 07/06/2020 FINAL  Final         Radiology Studies: No results found.      Scheduled Meds:  vitamin C  500 mg Oral BID   baricitinib  2 mg Oral Daily   cephALEXin  250 mg Oral QHS   feeding supplement  237 mL Oral TID BM   heparin  5,000 Units Subcutaneous Q8H   HYDROcodone-acetaminophen  1 tablet Oral BID   insulin aspart  0-5 Units Subcutaneous QHS   insulin aspart  0-9 Units Subcutaneous TID WC   insulin aspart  2 Units Subcutaneous TID WC   Ipratropium-Albuterol  1 puff Inhalation BID   levothyroxine  75 mcg Oral QAC breakfast   linagliptin  5 mg Oral Daily   multivitamin with minerals  1 tablet Oral Daily   predniSONE  50 mg Oral Daily   sodium chloride flush  3 mL Intravenous Q12H   Continuous Infusions:   LOS: 9 days    Time spent:40 min    Gedalya Jim, Geraldo Docker, MD [facility] Pager [phone]  If 7PM-7AM, please contact night-coverage [email or web address] Password [email or web address] 07/12/2020, 5:52 PM

## 2020-07-12 NOTE — Progress Notes (Signed)
Nutrition Follow-up  INTERVENTION:   -Ensure Enlive po BID, each supplement provides 350 kcal and 20 grams of protein (vanilla)  -MVI with minerals daily  -Continue Vitamin C  NUTRITION DIAGNOSIS:   Increased nutrient needs related to catabolic illness, wound healing (acute hypoxemic respiratory failure due to COVID-19 virus; stage I pressure injury poa) as evidenced by estimated needs.  Ongoing  GOAL:   Patient will meet greater than or equal to 90% of their needs  Progressing.  MONITOR:   PO intake, Supplement acceptance, Weight trends, Labs, I & O's, Skin  ASSESSMENT:   79 year old male with history of MS with suprapubic catheter and recurrent Proteus UTIs, HFpEF, recent left ureteroscopy with stone extraction s/p stent placement on 05/22/20, CKD stage IIIb, vascular disease, HTN, and stroke with residual left-sided weakness. Pt tested positive for COVID-19 on 10/01 presented from monoclonal antibody infusion center with acute hypoxic respiratory failure.  COVID-19+ since 10/1.  Patient current consuming 50-65% of meals at this time. Pt is drinking Ensure supplements.   Admission weight: 111 lbs. Current weight: 114 lbs.  Medications: Vitamin C, Multivitamin with minerals daily  Labs reviewed:  CBGs: 106-234 Elevated Mg  Diet Order:   Diet Order            DIET DYS 3 Room service appropriate? No; Fluid consistency: Nectar Thick  Diet effective now                 EDUCATION NEEDS:   Education needs have been addressed  Skin:  Skin Assessment: Skin Integrity Issues: Skin Integrity Issues:: Stage I Stage I: sacrum  Last BM:  10/05  Height:   Ht Readings from Last 1 Encounters:  07/03/20 5\' 3"  (1.6 m)    Weight:   Wt Readings from Last 1 Encounters:  07/12/20 52.1 kg   BMI:  Body mass index is 20.35 kg/m.  Estimated Nutritional Needs:   Kcal:  1610-9604  Protein:  80-90  Fluid:  > 1.3 L/day  [name] Bibles, MS, RD, LDN Inpatient  Clinical Dietitian Contact information available via Amion

## 2020-07-12 NOTE — TOC Progression Note (Signed)
Transition of Care [facility]) - Progression Note    Patient Details  Name: Jordan Ward MRN: [id number] Date of Birth: 05-Jun-1941  Transition of Care [facility]) CM/SW [place], [place] Phone Number: 07/12/2020, 2:22 PM  Clinical Narrative:   Spoke with wife, who confirmed that she spoke with patient yesterday to "plant the idea" of going to SNF.  "He said he would think about it."  I called patient today after he saw PT.  [name] admitted that he is feeling weak, and ultimately agreed to go to [facility].  "Not because I want to, but because I don't have other options."  I called his daughter to give her the update, and she will relay message to his wife. TOC will continue to follow during the course of hospitalization.     Expected Discharge Plan: [place] Barriers to Discharge: No Barriers Identified  Expected Discharge Plan and Services Expected Discharge Plan: [place]   Discharge Planning Services: CM Consult Post Acute Care Choice: Home Health                                         Social Determinants of Health (SDOH) Interventions    Readmission Risk Interventions No flowsheet data found.

## 2020-07-12 NOTE — Progress Notes (Signed)
Physical Therapy Treatment Patient Details Name: Jordan Ward MRN: [id number] DOB: Sep 18, 1941 Today's Date: 07/12/2020    History of Present Illness 79 yo male admitted with COVID. Hx of MS, HF, CKD, CVA with L sided weakness, CAD, gait d/o, PVD, neurogenic bladder    PT Comments    The patient  Is improving in bed mobility , did not require assitance to sit up, extra time. Patient does require min steady assistance ansd Oxygen for ambulation, assist to guide Rw.  Encouraged patient that a short term Rehab  Would be beneficial as is reported that patient's wife is recovering from Covid illness. Patient will  Require assistance for any ambulation as he is unsteady and requirs supplemental oxygen. Spoke to patient and his wife about recommendations. Wife asking to speak with MSW so chat [place], [place].   Follow Up Recommendations  SNF     Equipment Recommendations  None recommended by PT    Recommendations for Other Services       Precautions / Restrictions Precautions Precautions: Fall Precaution Comments: monitor O2, supropubic cath    Mobility  Bed Mobility   Bed Mobility: Supine to Sit;Sit to Supine     Supine to sit: Min guard;HOB elevated Sit to supine: Min guard   General bed mobility comments: patient initiated and completed moving to bed edge  Transfers Overall transfer level: Needs assistance Equipment used: Rolling walker (2 wheeled) Transfers: Sit to/from Stand Sit to Stand: Min assist         General transfer comment: steady assist to stand from bed, tending to lean backwards  Ambulation/Gait Ambulation/Gait assistance: Min assist Gait Distance (Feet): 50 Feet Assistive device: Rolling walker (2 wheeled) Gait Pattern/deviations: Step-to pattern;Step-through pattern;Decreased stride length;Decreased step length - left;Narrow base of support Gait velocity: decr   General Gait Details: Narrow BOS with intermittent difficulty L LE swing  through. Assist to steady throughout distance. O2 85% 2L.   Stairs             Wheelchair Mobility    Modified Rankin (Stroke Patients Only)       Balance Overall balance assessment: Needs assistance Sitting-balance support: Feet supported Sitting balance-Leahy Scale: Fair     Standing balance support: Bilateral upper extremity supported Standing balance-Leahy Scale: Poor Standing balance comment: leans heavily onto sink when brushing teeth                            Cognition Arousal/Alertness: Awake/alert Behavior During Therapy: WFL for tasks assessed/performed;Flat affect                                   General Comments: very slow to respond at times-likely 2* MS      Exercises      General Comments        Pertinent Vitals/Pain Pain Assessment: No/denies pain    Home Living                      Prior Function            PT Goals (current goals can now be found in the care plan section) Progress towards PT goals: Progressing toward goals    Frequency    Min 3X/week      PT Plan Discharge plan needs to be updated    Co-evaluation  AM-PAC PT "6 Clicks" Mobility   Outcome Measure  Help needed turning from your back to your side while in a flat bed without using bedrails?: A Little Help needed moving from lying on your back to sitting on the side of a flat bed without using bedrails?: A Little Help needed moving to and from a bed to a chair (including a wheelchair)?: A Little Help needed standing up from a chair using your arms (e.g., wheelchair or bedside chair)?: A Little Help needed to walk in hospital room?: A Lot Help needed climbing 3-5 steps with a railing? : A Lot 6 Click Score: 16    End of Session Equipment Utilized During Treatment: Gait belt;Oxygen Activity Tolerance: Patient tolerated treatment well Patient left: in bed;with call bell/phone within reach;with bed alarm  set Nurse Communication: Mobility status PT Visit Diagnosis: Muscle weakness (generalized) (M62.81);History of falling (Z91.81);Difficulty in walking, not elsewhere classified (R26.2)     Time: 2202-5427 PT Time Calculation (min) (ACUTE ONLY): 45 min  Charges:  $Gait Training: 23-37 mins $Therapeutic Activity: 8-22 mins                     Jordan Ward PT Acute Rehabilitation Services Pager [phone] Office [phone]    Jordan Ward 07/12/2020, 2:30 PM

## 2020-07-13 DIAGNOSIS — N1330 Unspecified hydronephrosis: Secondary | ICD-10-CM

## 2020-07-13 DIAGNOSIS — R2681 Unsteadiness on feet: Secondary | ICD-10-CM | POA: Diagnosis not present

## 2020-07-13 DIAGNOSIS — N179 Acute kidney failure, unspecified: Secondary | ICD-10-CM | POA: Diagnosis not present

## 2020-07-13 DIAGNOSIS — I110 Hypertensive heart disease with heart failure: Secondary | ICD-10-CM | POA: Diagnosis not present

## 2020-07-13 DIAGNOSIS — E039 Hypothyroidism, unspecified: Secondary | ICD-10-CM | POA: Diagnosis not present

## 2020-07-13 DIAGNOSIS — G35 Multiple sclerosis: Secondary | ICD-10-CM | POA: Diagnosis not present

## 2020-07-13 DIAGNOSIS — E440 Moderate protein-calorie malnutrition: Secondary | ICD-10-CM | POA: Diagnosis not present

## 2020-07-13 DIAGNOSIS — M6259 Muscle wasting and atrophy, not elsewhere classified, multiple sites: Secondary | ICD-10-CM | POA: Diagnosis not present

## 2020-07-13 DIAGNOSIS — R1312 Dysphagia, oropharyngeal phase: Secondary | ICD-10-CM | POA: Diagnosis not present

## 2020-07-13 DIAGNOSIS — R0602 Shortness of breath: Secondary | ICD-10-CM | POA: Diagnosis not present

## 2020-07-13 DIAGNOSIS — D649 Anemia, unspecified: Secondary | ICD-10-CM | POA: Diagnosis not present

## 2020-07-13 DIAGNOSIS — I5032 Chronic diastolic (congestive) heart failure: Secondary | ICD-10-CM | POA: Diagnosis not present

## 2020-07-13 DIAGNOSIS — D696 Thrombocytopenia, unspecified: Secondary | ICD-10-CM | POA: Diagnosis not present

## 2020-07-13 DIAGNOSIS — N189 Chronic kidney disease, unspecified: Secondary | ICD-10-CM | POA: Diagnosis not present

## 2020-07-13 DIAGNOSIS — U071 COVID-19: Secondary | ICD-10-CM | POA: Diagnosis not present

## 2020-07-13 DIAGNOSIS — R5381 Other malaise: Secondary | ICD-10-CM | POA: Diagnosis not present

## 2020-07-13 DIAGNOSIS — J9601 Acute respiratory failure with hypoxia: Secondary | ICD-10-CM | POA: Diagnosis not present

## 2020-07-13 DIAGNOSIS — I679 Cerebrovascular disease, unspecified: Secondary | ICD-10-CM | POA: Diagnosis not present

## 2020-07-13 DIAGNOSIS — G4733 Obstructive sleep apnea (adult) (pediatric): Secondary | ICD-10-CM | POA: Diagnosis not present

## 2020-07-13 DIAGNOSIS — I10 Essential (primary) hypertension: Secondary | ICD-10-CM | POA: Diagnosis not present

## 2020-07-13 DIAGNOSIS — R41841 Cognitive communication deficit: Secondary | ICD-10-CM | POA: Diagnosis not present

## 2020-07-13 DIAGNOSIS — R498 Other voice and resonance disorders: Secondary | ICD-10-CM | POA: Diagnosis not present

## 2020-07-13 DIAGNOSIS — R2689 Other abnormalities of gait and mobility: Secondary | ICD-10-CM | POA: Diagnosis not present

## 2020-07-13 DIAGNOSIS — R0902 Hypoxemia: Secondary | ICD-10-CM | POA: Diagnosis not present

## 2020-07-13 DIAGNOSIS — G9349 Other encephalopathy: Secondary | ICD-10-CM | POA: Diagnosis not present

## 2020-07-13 DIAGNOSIS — M6281 Muscle weakness (generalized): Secondary | ICD-10-CM | POA: Diagnosis not present

## 2020-07-13 DIAGNOSIS — R2230 Localized swelling, mass and lump, unspecified upper limb: Secondary | ICD-10-CM | POA: Diagnosis not present

## 2020-07-13 DIAGNOSIS — J9600 Acute respiratory failure, unspecified whether with hypoxia or hypercapnia: Secondary | ICD-10-CM | POA: Diagnosis not present

## 2020-07-13 DIAGNOSIS — Z743 Need for continuous supervision: Secondary | ICD-10-CM | POA: Diagnosis not present

## 2020-07-13 DIAGNOSIS — R279 Unspecified lack of coordination: Secondary | ICD-10-CM | POA: Diagnosis not present

## 2020-07-13 DIAGNOSIS — M1990 Unspecified osteoarthritis, unspecified site: Secondary | ICD-10-CM | POA: Diagnosis not present

## 2020-07-13 DIAGNOSIS — I2510 Atherosclerotic heart disease of native coronary artery without angina pectoris: Secondary | ICD-10-CM | POA: Diagnosis not present

## 2020-07-13 LAB — GLUCOSE, CAPILLARY
Glucose-Capillary: 136 mg/dL — ABNORMAL HIGH (ref 70–99)
Glucose-Capillary: 158 mg/dL — ABNORMAL HIGH (ref 70–99)

## 2020-07-13 MED ORDER — IPRATROPIUM-ALBUTEROL 20-100 MCG/ACT IN AERS: 1.0000 | INHALATION_SPRAY | Freq: Four times a day (QID) | RESPIRATORY_TRACT | 0 refills | Status: DC | PRN

## 2020-07-13 MED ORDER — DIAZEPAM 5 MG PO TABS: 5.0000 mg | ORAL_TABLET | Freq: Two times a day (BID) | ORAL | 0 refills | Status: DC | PRN

## 2020-07-13 MED ORDER — ACETAMINOPHEN 325 MG PO TABS: 650.0000 mg | ORAL_TABLET | Freq: Four times a day (QID) | ORAL | 0 refills | Status: DC | PRN

## 2020-07-13 MED ORDER — LINAGLIPTIN 5 MG PO TABS
5.0000 mg | ORAL_TABLET | Freq: Every day | ORAL | 0 refills | Status: DC
Start: 2020-07-14 — End: 2020-09-11

## 2020-07-13 MED ORDER — GUAIFENESIN-DM 100-10 MG/5ML PO SYRP: 10.0000 mL | ORAL_SOLUTION | ORAL | 0 refills | Status: DC | PRN

## 2020-07-13 MED ORDER — ASCORBIC ACID 500 MG PO TABS
500.0000 mg | ORAL_TABLET | Freq: Two times a day (BID) | ORAL | 0 refills | Status: DC
Start: 2020-07-13 — End: 2020-11-20

## 2020-07-13 MED ORDER — CEPHALEXIN 250 MG PO CAPS
250.0000 mg | ORAL_CAPSULE | Freq: Every day | ORAL | 0 refills | Status: DC
Start: 2020-07-13 — End: 2020-09-11

## 2020-07-13 MED ORDER — RESOURCE THICKENUP CLEAR PO POWD: ORAL | Status: DC

## 2020-07-13 MED ORDER — ADULT MULTIVITAMIN W/MINERALS CH: 1.0000 | ORAL_TABLET | Freq: Every day | ORAL | 0 refills | Status: DC

## 2020-07-13 NOTE — TOC Transition Note (Addendum)
Transition of Care [facility]) - CM/SW Discharge Note   Patient Details  Name: Jordan Ward MRN: [id number] Date of Birth: 1941-05-31  [facility] CM/SW Contact:  Trish Mage, LCSW Phone Number: 07/13/2020, 11:02 AM   Clinical Narrative:   Patient to transition to [facility] today.  Family informed.  PTAR set up. Nursing, please call report to [phone]. Room 808P.      TOC sign off.     Final next level of care: [place] Barriers to Discharge: No Barriers Identified   Patient Goals and CMS Choice     Choice offered to / list presented to : Adult Children  Discharge Placement                       Discharge Plan and Services   Discharge Planning Services: CM Consult Post Acute Care Choice: Home Health                               Social Determinants of Health (SDOH) Interventions     Readmission Risk Interventions No flowsheet data found.

## 2020-07-13 NOTE — Plan of Care (Signed)

## 2020-07-13 NOTE — Progress Notes (Signed)
Attempted to reach nurse at [facility] where pt is going, placed on hold for 13 mins then call transferred back to directory. This nurse remains unable to call report.

## 2020-07-13 NOTE — Progress Notes (Signed)
Attempted to call report, nurse unavailable.

## 2020-07-13 NOTE — Progress Notes (Signed)
Attempted to call report, was told the nurse would call me back

## 2020-07-13 NOTE — Consult Note (Signed)
   [facility] CM Inpatient Consult   07/13/2020  Jordan Ward [birth year] [phone]   Patient screened for high risk score for unplanned readmission. Chart reviewed to assess for potential [facility] community service needs. Per review, current plan is for SNF. No [name] follow up needs.   [name], MSN, [facility] Liaison Nurse Mobile Phone [phone]  Toll free office [phone]

## 2020-07-13 NOTE — Progress Notes (Signed)
Notified flutter valve missing mouth piece. Sent new flutter to the floor. Rn notified

## 2020-07-13 NOTE — Discharge Instructions (Signed)
COVID-19 COVID-19 is a respiratory infection that is caused by a virus called severe acute respiratory syndrome coronavirus 2 (SARS-CoV-2). The disease is also known as coronavirus disease or novel coronavirus. In some people, the virus may not cause any symptoms. In others, it may cause a serious infection. The infection can get worse quickly and can lead to complications, such as:  Pneumonia, or infection of the lungs.  Acute respiratory distress syndrome or ARDS. This is a condition in which fluid build-up in the lungs prevents the lungs from filling with air and passing oxygen into the blood.  Acute respiratory failure. This is a condition in which there is not enough oxygen passing from the lungs to the body or when carbon dioxide is not passing from the lungs out of the body.  Sepsis or septic shock. This is a serious bodily reaction to an infection.  Blood clotting problems.  Secondary infections due to bacteria or fungus.  Organ failure. This is when your body's organs stop working. The virus that causes COVID-19 is contagious. This means that it can spread from person to person through droplets from coughs and sneezes (respiratory secretions). What are the causes? This illness is caused by a virus. You may catch the virus by:  Breathing in droplets from an infected person. Droplets can be spread by a person breathing, speaking, singing, coughing, or sneezing.  Touching something, like a table or a doorknob, that was exposed to the virus (contaminated) and then touching your mouth, nose, or eyes. What increases the risk? Risk for infection You are more likely to be infected with this virus if you:  Are within 6 feet (2 meters) of a person with COVID-19.  Provide care for or live with a person who is infected with COVID-19.  Spend time in crowded indoor spaces or live in shared housing. Risk for serious illness You are more likely to become seriously ill from the virus if  you:  Are 50 years of age or older. The higher your age, the more you are at risk for serious illness.  Live in a nursing home or long-term care facility.  Have cancer.  Have a long-term (chronic) disease such as: ? Chronic lung disease, including chronic obstructive pulmonary disease or asthma. ? A long-term disease that lowers your body's ability to fight infection (immunocompromised). ? Heart disease, including heart failure, a condition in which the arteries that lead to the heart become narrow or blocked (coronary artery disease), a disease which makes the heart muscle thick, weak, or stiff (cardiomyopathy). ? Diabetes. ? Chronic kidney disease. ? Sickle cell disease, a condition in which red blood cells have an abnormal "sickle" shape. ? Liver disease.  Are obese. What are the signs or symptoms? Symptoms of this condition can range from mild to severe. Symptoms may appear any time from 2 to 14 days after being exposed to the virus. They include:  A fever or chills.  A cough.  Difficulty breathing.  Headaches, body aches, or muscle aches.  Runny or stuffy (congested) nose.  A sore throat.  New loss of taste or smell. Some people may also have stomach problems, such as nausea, vomiting, or diarrhea. Other people may not have any symptoms of COVID-19. How is this diagnosed? This condition may be diagnosed based on:  Your signs and symptoms, especially if: ? You live in an area with a COVID-19 outbreak. ? You recently traveled to or from an area where the virus is common. ? You   provide care for or live with a person who was diagnosed with COVID-19. ? You were exposed to a person who was diagnosed with COVID-19.  A physical exam.  Lab tests, which may include: ? Taking a sample of fluid from the back of your nose and throat (nasopharyngeal fluid), your nose, or your throat using a swab. ? A sample of mucus from your lungs (sputum). ? Blood tests.  Imaging tests,  which may include, X-rays, CT scan, or ultrasound. How is this treated? At present, there is no medicine to treat COVID-19. Medicines that treat other diseases are being used on a trial basis to see if they are effective against COVID-19. Your health care provider will talk with you about ways to treat your symptoms. For most people, the infection is mild and can be managed at home with rest, fluids, and over-the-counter medicines. Treatment for a serious infection usually takes places in a hospital intensive care unit (ICU). It may include one or more of the following treatments. These treatments are given until your symptoms improve.  Receiving fluids and medicines through an IV.  Supplemental oxygen. Extra oxygen is given through a tube in the nose, a face mask, or a hood.  Positioning you to lie on your stomach (prone position). This makes it easier for oxygen to get into the lungs.  Continuous positive airway pressure (CPAP) or bi-level positive airway pressure (BPAP) machine. This treatment uses mild air pressure to keep the airways open. A tube that is connected to a motor delivers oxygen to the body.  Ventilator. This treatment moves air into and out of the lungs by using a tube that is placed in your windpipe.  Tracheostomy. This is a procedure to create a hole in the neck so that a breathing tube can be inserted.  Extracorporeal membrane oxygenation (ECMO). This procedure gives the lungs a chance to recover by taking over the functions of the heart and lungs. It supplies oxygen to the body and removes carbon dioxide. Follow these instructions at home: Lifestyle  If you are sick, stay home except to get medical care. Your health care provider will tell you how long to stay home. Call your health care provider before you go for medical care.  Rest at home as told by your health care provider.  Do not use any products that contain nicotine or tobacco, such as cigarettes,  e-cigarettes, and chewing tobacco. If you need help quitting, ask your health care provider.  Return to your normal activities as told by your health care provider. Ask your health care provider what activities are safe for you. General instructions  Take over-the-counter and prescription medicines only as told by your health care provider.  Drink enough fluid to keep your urine pale yellow.  Keep all follow-up visits as told by your health care provider. This is important. How is this prevented?  There is no vaccine to help prevent COVID-19 infection. However, there are steps you can take to protect yourself and others from this virus. To protect yourself:   Do not travel to areas where COVID-19 is a risk. The areas where COVID-19 is reported change often. To identify high-risk areas and travel restrictions, check the CDC travel website: wwwnc.cdc.gov/travel/notices  If you live in, or must travel to, an area where COVID-19 is a risk, take precautions to avoid infection. ? Stay away from people who are sick. ? Wash your hands often with soap and water for 20 seconds. If soap and water   are not available, use an alcohol-based hand sanitizer. ? Avoid touching your mouth, face, eyes, or nose. ? Avoid going out in public, follow guidance from your state and local health authorities. ? If you must go out in public, wear a cloth face covering or face mask. Make sure your mask covers your nose and mouth. ? Avoid crowded indoor spaces. Stay at least 6 feet (2 meters) away from others. ? Disinfect objects and surfaces that are frequently touched every day. This may include:  Counters and tables.  Doorknobs and light switches.  Sinks and faucets.  Electronics, such as phones, remote controls, keyboards, computers, and tablets. To protect others: If you have symptoms of COVID-19, take steps to prevent the virus from spreading to others.  If you think you have a COVID-19 infection, contact  your health care provider right away. Tell your health care team that you think you may have a COVID-19 infection.  Stay home. Leave your house only to seek medical care. Do not use public transport.  Do not travel while you are sick.  Wash your hands often with soap and water for 20 seconds. If soap and water are not available, use alcohol-based hand sanitizer.  Stay away from other members of your household. Let healthy household members care for children and pets, if possible. If you have to care for children or pets, wash your hands often and wear a mask. If possible, stay in your own room, separate from others. Use a different bathroom.  Make sure that all people in your household wash their hands well and often.  Cough or sneeze into a tissue or your sleeve or elbow. Do not cough or sneeze into your hand or into the air.  Wear a cloth face covering or face mask. Make sure your mask covers your nose and mouth. Where to find more information  Centers for Disease Control and Prevention: www.cdc.gov/coronavirus/2019-ncov/index.html  World Health Organization: www.who.int/health-topics/coronavirus Contact a health care provider if:  You live in or have traveled to an area where COVID-19 is a risk and you have symptoms of the infection.  You have had contact with someone who has COVID-19 and you have symptoms of the infection. Get help right away if:  You have trouble breathing.  You have pain or pressure in your chest.  You have confusion.  You have bluish lips and fingernails.  You have difficulty waking from sleep.  You have symptoms that get worse. These symptoms may represent a serious problem that is an emergency. Do not wait to see if the symptoms will go away. Get medical help right away. Call your local emergency services (911 in the [place] Do not drive yourself to the hospital. Let the emergency medical personnel know if you think you have  COVID-19. Summary  COVID-19 is a respiratory infection that is caused by a virus. It is also known as coronavirus disease or novel coronavirus. It can cause serious infections, such as pneumonia, acute respiratory distress syndrome, acute respiratory failure, or sepsis.  The virus that causes COVID-19 is contagious. This means that it can spread from person to person through droplets from breathing, speaking, singing, coughing, or sneezing.  You are more likely to develop a serious illness if you are 50 years of age or older, have a weak immune system, live in a nursing home, or have chronic disease.  There is no medicine to treat COVID-19. Your health care provider will talk with you about ways to treat your symptoms.    Take steps to protect yourself and others from infection. Wash your hands often and disinfect objects and surfaces that are frequently touched every day. Stay away from people who are sick and wear a mask if you are sick. This information is not intended to replace advice given to you by your health care provider. Make sure you discuss any questions you have with your health care provider. Document Revised: 07/15/2019 Document Reviewed: 10/21/2018 Elsevier Patient Education  2020 [employer].   COVID-19 Frequently Asked Questions COVID-19 (coronavirus disease) is an infection that is caused by a large family of viruses. Some viruses cause illness in people and others cause illness in animals like camels, cats, and bats. In some cases, the viruses that cause illness in animals can spread to humans. Where did the coronavirus come from? In December 2019, [place] told the [facility]) of several cases of lung disease (human respiratory illness). These cases were linked to an open seafood and livestock market in the city of [place]. The link to the seafood and livestock market suggests that the virus may have spread from animals to humans. However, since that first  outbreak in December, the virus has also been shown to spread from person to person. What is the name of the disease and the virus? Disease name Early on, this disease was called novel coronavirus. This is because scientists determined that the disease was caused by a new (novel) respiratory virus. The World Health Organization [facility]) has now named the disease COVID-19, or coronavirus disease. Virus name The virus that causes the disease is called severe acute respiratory syndrome coronavirus 2 (SARS-CoV-2). More information on disease and virus naming World Health Organization [facility]): www.who.int/emergencies/diseases/novel-coronavirus-2019/technical-guidance/naming-the-coronavirus-disease-(covid-2019)-and-the-virus-that-causes-it Who is at risk for complications from coronavirus disease? Some people may be at higher risk for complications from coronavirus disease. This includes older adults and people who have chronic diseases, such as heart disease, diabetes, and lung disease. If you are at higher risk for complications, take these extra precautions:  Stay home as much as possible.  Avoid social gatherings and travel.  Avoid close contact with others. Stay at least 6 ft (2 m) away from others, if possible.  Wash your hands often with soap and water for at least 20 seconds.  Avoid touching your face, mouth, nose, or eyes.  Keep supplies on hand at home, such as food, medicine, and cleaning supplies.  If you must go out in public, wear a cloth face covering or face mask. Make sure your mask covers your nose and mouth. How does coronavirus disease spread? The virus that causes coronavirus disease spreads easily from person to person (is contagious). You may catch the virus by:  Breathing in droplets from an infected person. Droplets can be spread by a person breathing, speaking, singing, coughing, or sneezing.  Touching something, like a table or a doorknob, that was exposed to the virus  (contaminated) and then touching your mouth, nose, or eyes. Can I get the virus from touching surfaces or objects? There is still a lot that we do not know about the virus that causes coronavirus disease. Scientists are basing a lot of information on what they know about similar viruses, such as:  Viruses cannot generally survive on surfaces for long. They need a human body (host) to survive.  It is more likely that the virus is spread by close contact with people who are sick (direct contact), such as through: ? Shaking hands or hugging. ? Breathing in respiratory droplets  that travel through the air. Droplets can be spread by a person breathing, speaking, singing, coughing, or sneezing.  It is less likely that the virus is spread when a person touches a surface or object that has the virus on it (indirect contact). The virus may be able to enter the body if the person touches a surface or object and then touches his or her face, eyes, nose, or mouth. Can a person spread the virus without having symptoms of the disease? It may be possible for the virus to spread before a person has symptoms of the disease, but this is most likely not the main way the virus is spreading. It is more likely for the virus to spread by being in close contact with people who are sick and breathing in the respiratory droplets spread by a person breathing, speaking, singing, coughing, or sneezing. What are the symptoms of coronavirus disease? Symptoms vary from person to person and can range from mild to severe. Symptoms may include:  Fever or chills.  Cough.  Difficulty breathing or feeling short of breath.  Headaches, body aches, or muscle aches.  Runny or stuffy (congested) nose.  Sore throat.  New loss of taste or smell.  Nausea, vomiting, or diarrhea. These symptoms can appear anywhere from 2 to 14 days after you have been exposed to the virus. Some people may not have any symptoms. If you develop  symptoms, call your health care provider. People with severe symptoms may need hospital care. Should I be tested for this virus? Your health care provider will decide whether to test you based on your symptoms, history of exposure, and your risk factors. How does a health care provider test for this virus? Health care providers will collect samples to send for testing. Samples may include:  Taking a swab of fluid from the back of your nose and throat, your nose, or your throat.  Taking fluid from the lungs by having you cough up mucus (sputum) into a sterile cup.  Taking a blood sample. Is there a treatment or vaccine for this virus? Currently, there is no vaccine to prevent coronavirus disease. Also, there are no medicines like antibiotics or antivirals to treat the virus. A person who becomes sick is given supportive care, which means rest and fluids. A person may also relieve his or her symptoms by using over-the-counter medicines that treat sneezing, coughing, and runny nose. These are the same medicines that a person takes for the common cold. If you develop symptoms, call your health care provider. People with severe symptoms may need hospital care. What can I do to protect myself and my family from this virus?     You can protect yourself and your family by taking the same actions that you would take to prevent the spread of other viruses. Take the following actions:  Wash your hands often with soap and water for at least 20 seconds. If soap and water are not available, use alcohol-based hand sanitizer.  Avoid touching your face, mouth, nose, or eyes.  Cough or sneeze into a tissue, sleeve, or elbow. Do not cough or sneeze into your hand or the air. ? If you cough or sneeze into a tissue, throw it away immediately and wash your hands.  Disinfect objects and surfaces that you frequently touch every day.  Stay away from people who are sick.  Avoid going out in public, follow  guidance from your state and local health authorities.  Avoid crowded indoor spaces.   Stay at least 6 ft (2 m) away from others.  If you must go out in public, wear a cloth face covering or face mask. Make sure your mask covers your nose and mouth.  Stay home if you are sick, except to get medical care. Call your health care provider before you get medical care. Your health care provider will tell you how long to stay home.  Make sure your vaccines are up to date. Ask your health care provider what vaccines you need. What should I do if I need to travel? Follow travel recommendations from your local health authority, the CDC, and WHO. Travel information and advice  Centers for Disease Control and Prevention (CDC): BodyEditor.hu  World Health Organization [facility]): ThirdIncome.ca Know the risks and take action to protect your health  You are at higher risk of getting coronavirus disease if you are traveling to areas with an outbreak or if you are exposed to travelers from areas with an outbreak.  Wash your hands often and practice good hygiene to lower the risk of catching or spreading the virus. What should I do if I am sick? General instructions to stop the spread of infection  Wash your hands often with soap and water for at least 20 seconds. If soap and water are not available, use alcohol-based hand sanitizer.  Cough or sneeze into a tissue, sleeve, or elbow. Do not cough or sneeze into your hand or the air.  If you cough or sneeze into a tissue, throw it away immediately and wash your hands.  Stay home unless you must get medical care. Call your health care provider or local health authority before you get medical care.  Avoid public areas. Do not take public transportation, if possible.  If you can, wear a mask if you must go out of the house or if you are in close contact with someone  who is not sick. Make sure your mask covers your nose and mouth. Keep your home clean  Disinfect objects and surfaces that are frequently touched every day. This may include: ? Counters and tables. ? Doorknobs and light switches. ? Sinks and faucets. ? Electronics such as phones, remote controls, keyboards, computers, and tablets.  Wash dishes in hot, soapy water or use a dishwasher. Air-dry your dishes.  Wash laundry in hot water. Prevent infecting other household members  Let healthy household members care for children and pets, if possible. If you have to care for children or pets, wash your hands often and wear a mask.  Sleep in a different bedroom or bed, if possible.  Do not share personal items, such as razors, toothbrushes, deodorant, combs, brushes, towels, and washcloths. Where to find more information Centers for Disease Control and Prevention (CDC)  Information and news updates: https://www.butler-gonzalez.com/ World Health Organization [facility])  Information and news updates: MissExecutive.com.ee  Coronavirus health topic: https://www.castaneda.info/  Questions and answers on COVID-19: OpportunityDebt.at  Global tracker: who.sprinklr.com American Academy of Pediatrics (AAP)  Information for families: www.healthychildren.org/English/health-issues/conditions/chest-lungs/Pages/2019-Novel-Coronavirus.aspx The coronavirus situation is changing rapidly. Check your local health authority website or the CDC and [facility] websites for updates and news. When should I contact a health care provider?  Contact your health care provider if you have symptoms of an infection, such as fever or cough, and you: ? Have been near anyone who is known to have coronavirus disease. ? Have come into contact with a person who is suspected to have coronavirus disease. ? Have traveled to an area where there is  an outbreak of  COVID-19. When should I get emergency medical care?  Get help right away by calling your local emergency services (911 in the [place] if you have: ? Trouble breathing. ? Pain or pressure in your chest. ? Confusion. ? Blue-tinged lips and fingernails. ? Difficulty waking from sleep. ? Symptoms that get worse. Let the emergency medical personnel know if you think you have coronavirus disease. Summary  A new respiratory virus is spreading from person to person and causing COVID-19 (coronavirus disease).  The virus that causes COVID-19 appears to spread easily. It spreads from one person to another through droplets from breathing, speaking, singing, coughing, or sneezing.  Older adults and those with chronic diseases are at higher risk of disease. If you are at higher risk for complications, take extra precautions.  There is currently no vaccine to prevent coronavirus disease. There are no medicines, such as antibiotics or antivirals, to treat the virus.  You can protect yourself and your family by washing your hands often, avoiding touching your face, and covering your coughs and sneezes. This information is not intended to replace advice given to you by your health care provider. Make sure you discuss any questions you have with your health care provider. Document Revised: 07/15/2019 Document Reviewed: 01/11/2019 Elsevier Patient Education  [place].

## 2020-07-13 NOTE — Discharge Summary (Addendum)
Physician Discharge Summary  Jordan Ward ZHY:[id number] DOB: October 15, 1940 DOA: 07/03/2020  PCP: Noreene Larsson, NP  Admit date: 07/03/2020 Discharge date: 07/13/2020  Time spent: 35 minutes  Recommendations for Outpatient Follow-up:   Principal Problem:   Acute hypoxemic respiratory failure due to COVID-19 [facility]) Active Problems:   Multiple sclerosis (HCC)   Hypothyroidism   Chronic diastolic (congestive) heart failure (HCC)   Essential hypertension   Cerebrovascular disease   Hypotension   Acute kidney injury superimposed on CKD ([place])   Acute respiratory failure with hypoxia ([place])   Pneumonia due to COVID-19 virus   Encephalopathy due to COVID-19 virus  Covid vaccination; not vaccinated  Acute respiratory failure with hypoxia/Covid pneumonia COVID-19 Labs  Recent Labs    07/11/20 0423 07/12/20 0426  DDIMER 2.04* 1.90*  FERRITIN 589* 480*  CRP 2.7* 1.3*    Lab Results  Component Value Date   SARSCOV2NAA POSITIVE (A) 07/12/2020   SARSCOV2NAA Detected (A) 06/29/2020   [place] NEGATIVE 05/21/2020   [place] NEGATIVE 05/02/2020    -Baricitinib per pharmacy protocol -Prednisone 50 mg daily complete -Complete course Remdesivir -Incentive spirometry -Flutter valve -Respimat BID SATURATION QUALIFICATIONS: (This note is used to comply with regulatory documentation for home oxygen) Patient Saturations on Room Air at Rest = 80% Patient Saturations on 2 Liters of oxygen at rest = 93% Patient Saturations on 2 Liters of oxygen while Ambulating = 82% -Patient meets criteria for home O2 -2 L O2 titrate to ensure SPO2> 88% -Provide Inogen home O2 portable concentrator -Patient will be considered contagious until 10/22 and need to take the below precautions to protect himself as well as residents of the facility.  Covid encephalopathy -Patient appeared alert and oriented today however do not know patient's baseline, stated he had ambulated with staff and performed  ambulatory SPO2 however no documentation that this occurred. -Patient may still be experiencing some mild confusion  Chronic diastolic CHF -Strict in and out -6.8 L -Daily weight Filed Weights   07/11/20 0454 07/12/20 0500 07/13/20 0500  Weight: 52.4 kg 52.1 kg 53 kg    Essential HTN -See CHF  Prediabetes -10/6 hemoglobin A1c= 6.3; meets criteria for prediabetes -Tradjenta 5 mg daily -Patient was hypoglycemic secondary to steroids which she will not be sent home on.  We will continue Tradjenta for now.  SNF will need to measure CBG before every meal/at bedtime and decide when to discontinue -PCP to determine what additional medication to start.  Multiple sclerosis with neurogenic bladder and suprapubic cath -Not on Biologics or immunomodulators -Continue home pain regimen  LEFT Hydronephrosis/Asymptomatic Bacteriuria -calculus in the inferior pole measuring 1.1 cm -Patient was initially started on ceftriaxone -Dr. Marcelline Deist discussed case with Dr. Junious Silk urology who recommended patient be discharged on suppressive antibiotics, and follow-up as an outpatient at which time it will be addressed. -Keflex 250 mg qhs -SNF to schedule follow-up with Dr. Junious Silk urology to address calculus in LEFT inferior pole  Acute on CKD stage IIIb (baseline Cr 1.3-1.5) - Cr peaked at 2.94 Lab Results  Component Value Date   CREATININE 1.64 (H) 07/12/2020   CREATININE 1.60 (H) 07/11/2020   CREATININE 1.40 (H) 07/10/2020   CREATININE 1.29 (H) 07/09/2020   CREATININE 1.48 (H) 07/08/2020  -Patient's creatinine close to baseline.  At time of discharge -Will be following up with urology per Dr. Junious Silk recommendation.  Hypothyroidism -Synthroid 75 mcg daily  Hx stroke -Residual LEFT sided weakness.  Stable    Discharge Condition: Stable  Diet recommendation:  Dysphagia 3 nectar thick  Filed Weights   07/11/20 0454 07/12/20 0500 07/13/20 0500  Weight: 52.4 kg 52.1 kg 53 kg     History of present illness:  79 year old WM PMHx WM Stroke with LEFT sided weakness MS with suprapubic catheter and recurrent Proteus UTIs, Chronic Diastolic CHF, HTN, recent left ureteroscopy with stone extraction and stent placement on 8/24, CKD3b, vascular disease, hypertension,   who presented today to the ED from the monoclonal antibody infusion center with acute hypoxic respiratory failure. He was diagnosed with COVID-19 on October 1 but is unsure of when his symptoms first started but started with a mild cough. Wife is a positive sick contact. He was referred to the infusion center and was noted to be desaturating to the 70s on room air and was transferred to the ED initially requiring 7 LPM to maintain sats greater than 90%. No fever, chills, nausea, vomiting, diarrhea, rash.  ED Course:Afebrile, bradycardic (58), hypotensive (124/75->89/61->95/53) requiring 6 LPM. Notable labs: Sodium 147, glucose 111, BUN 55, creatinine 2.94 (previously 1.74 on 8/9, baseline 1.3), LDH, ferritin 804, CRP 12.4, procalcitonin negative, lactic acid 1.3, WBC 4.7, Hb 12, platelets 100, D-dimer 2.4, fibrinogen 431, COVID-19 positive. CXR with RML and RLL ill-defined opacity.  He was admitted for COVID 19pneumonia. He's gradually improved with steroids, baricitinib (completed remdesivir). He's frail and will need 24 hr supervision per therapy, but wife currently recovering from covid, unsure if they can provide this amount of care. SW currently looking into SNF for rehab. D/c pending final decision on d/c plan, further pt improvement  Hospital Course:  See above  Procedures: 10/7 US Renal;Right Kidney:  Renal measurements: 6.7 x 3.2 x 3.4 cm = volume: 38 mL. Echogenicity within normal limits. There are two small cysts. No hydronephrosis. No shadowing calculi.  Left Kidney:  Renal measurements: 14.4 x 7.7 x 7.4 cm = volume: 431 mL. Echogenicity within normal limits. There is severe  hydronephrosis. There is a shadowing calculus in the inferior pole measuring 1.1 cm  Bladder  Decompressed with Foley in place.  10/12 CT chest W0 contrast; coronary artery calcifications are noted suggesting coronary artery disease. 2. Stable focal aneurysmal bulge is seen involving the lateral aspect of the transverse aortic arch. 3. Mild bronchiectasis is noted in both lower lobes with probable peripheral honeycombing seen in the lung bases suggesting pulmonary fibrosis. 4. There appears to be mild bibasilar ill-defined opacity concerning for subsegmental atelectasis or possibly infiltrates. 5. Severe right renal atrophy is noted. 6. Left hepatic pneumobilia is noted. This may be due to recent instrumentation or incompetent sphincter of Oddi. 7. Aortic atherosclerosis.  Ventilator settings Nasal cannula on 10/15 Flow; 2 L/min SPO2 93%   Cultures  10/1 SARS coronavirus positive   Antibiotics Anti-infectives (From admission, onward)   Start     Ordered Stop   07/11/20 2200  cephALEXin (KEFLEX) capsule 250 mg        07/11/20 1935     07/06/20 1000  cefTRIAXone (ROCEPHIN) 1 g in sodium chloride 0.9 % 100 mL IVPB        07/06/20 0904 07/10/20 1215   07/04/20 1000  remdesivir 100 mg in sodium chloride 0.9 % 100 mL IVPB  Status:  Discontinued       "Followed by" Linked Group Details   07/03/20 1559 07/03/20 1845   07/04/20 1000  remdesivir 100 mg in sodium chloride 0.9 % 100 mL IVPB  Status:  Discontinued       "Followed by"  Linked Group Details   07/03/20 1845 07/03/20 1905   07/04/20 1000  remdesivir 100 mg in sodium chloride 0.9 % 100 mL IVPB        07/03/20 2008 07/07/20 1008   07/03/20 1845  remdesivir 200 mg in sodium chloride 0.9% 250 mL IVPB  Status:  Discontinued       "Followed by" Linked Group Details   07/03/20 1845 07/03/20 1905   07/03/20 1700  remdesivir 200 mg in sodium chloride 0.9% 250 mL IVPB       "Followed by" Linked Group Details   07/03/20 1559  07/03/20 1800       Discharge Exam: Vitals:   07/12/20 2044 07/13/20 0500 07/13/20 0524 07/13/20 1324  BP: 127/68  121/73 (!) 156/71  Pulse: 68  65 69  Resp: 16  17 (!) 22  Temp: 98.4 F (36.9 C)  98 F (36.7 C) 98 F (36.7 C)  TempSrc: Oral  Oral   SpO2: 99%  93% 96%  Weight:  53 kg    Height:        General: A/O x4, positive acute respiratory distress, cachectic Eyes: negative scleral hemorrhage, negative anisocoria, negative icterus ENT: Negative Runny nose, negative gingival bleeding, Neck:  Negative scars, masses, torticollis, lymphadenopathy, JVD Lungs: creased breath sounds bilaterally without wheezes or crackles Cardiovascular: Regular rate and rhythm without murmur gallop or rub normal S1 and S2   Discharge Instructions  Discharge Instructions    Change dressing (specify)   Complete by: As directed    Per instructions   Diet - low sodium heart healthy   Complete by: As directed    Discharge instructions   Complete by: As directed    ?   Person Under Monitoring Name: Jordan Ward  Location: [address] [place] [address]   Infection Prevention Recommendations for Individuals Confirmed to have, or Being Evaluated for, 2019 Novel Coronavirus (COVID-19) Infection Who Receive Care at Home  Individuals who are confirmed to have, or are being evaluated for, COVID-19 should follow the prevention steps below until a healthcare provider or local or state health department says they can return to normal activities.  Stay home except to get medical care You should restrict activities outside your home, except for getting medical care. Do not go to work, school, or public areas, and do not use public transportation or taxis.  Call ahead before visiting your doctor Before your medical appointment, call the healthcare provider and tell them that you have, or are being evaluated for, COVID-19 infection. This will help the healthcare provider's office take steps  to keep other people from getting infected. Ask your healthcare provider to call the local or state health department.  Monitor your symptoms Seek prompt medical attention if your illness is worsening (e.g., difficulty breathing). Before going to your medical appointment, call the healthcare provider and tell them that you have, or are being evaluated for, COVID-19 infection. Ask your healthcare provider to call the local or state health department.  Wear a facemask You should wear a facemask that covers your nose and mouth when you are in the same room with other people and when you visit a healthcare provider. People who live with or visit you should also wear a facemask while they are in the same room with you.  Separate yourself from other people in your home As much as possible, you should stay in a different room from other people in your home. Also, you should use a  separate bathroom, if available.  Avoid sharing household items You should not share dishes, drinking glasses, cups, eating utensils, towels, bedding, or other items with other people in your home. After using these items, you should wash them thoroughly with soap and water.  Cover your coughs and sneezes Cover your mouth and nose with a tissue when you cough or sneeze, or you can cough or sneeze into your sleeve. Throw used tissues in a lined trash can, and immediately wash your hands with soap and water for at least 20 seconds or use an alcohol-based hand rub.  Wash your [facility] your hands often and thoroughly with soap and water for at least 20 seconds. You can use an alcohol-based hand sanitizer if soap and water are not available and if your hands are not visibly dirty. Avoid touching your eyes, nose, and mouth with unwashed hands.   Prevention Steps for Caregivers and Household Members of Individuals Confirmed to have, or Being Evaluated for, COVID-19 Infection Being Cared for in the Home  If you live  with, or provide care at home for, a person confirmed to have, or being evaluated for, COVID-19 infection please follow these guidelines to prevent infection:  Follow healthcare provider's instructions Make sure that you understand and can help the patient follow any healthcare provider instructions for all care.  Provide for the patient's basic needs You should help the patient with basic needs in the home and provide support for getting groceries, prescriptions, and other personal needs.  Monitor the patient's symptoms If they are getting sicker, call his or her medical provider and tell them that the patient has, or is being evaluated for, COVID-19 infection. This will help the healthcare provider's office take steps to keep other people from getting infected. Ask the healthcare provider to call the local or state health department.  Limit the number of people who have contact with the patient If possible, have only one caregiver for the patient. Other household members should stay in another home or place of residence. If this is not possible, they should stay in another room, or be separated from the patient as much as possible. Use a separate bathroom, if available. Restrict visitors who do not have an essential need to be in the home.  Keep older adults, very young children, and other sick people away from the patient Keep older adults, very young children, and those who have compromised immune systems or chronic health conditions away from the patient. This includes people with chronic heart, lung, or kidney conditions, diabetes, and cancer.  Ensure good ventilation Make sure that shared spaces in the home have good air flow, such as from an air conditioner or an opened window, weather permitting.  Wash your hands often Wash your hands often and thoroughly with soap and water for at least 20 seconds. You can use an alcohol based hand sanitizer if soap and water are not available  and if your hands are not visibly dirty. Avoid touching your eyes, nose, and mouth with unwashed hands. Use disposable paper towels to dry your hands. If not available, use dedicated cloth towels and replace them when they become wet.  Wear a facemask and gloves Wear a disposable facemask at all times in the room and gloves when you touch or have contact with the patient's blood, body fluids, and/or secretions or excretions, such as sweat, saliva, sputum, nasal mucus, vomit, urine, or feces.  Ensure the mask fits over your nose and mouth tightly, and  do not touch it during use. Throw out disposable facemasks and gloves after using them. Do not reuse. Wash your hands immediately after removing your facemask and gloves. If your personal clothing becomes contaminated, carefully remove clothing and launder. Wash your hands after handling contaminated clothing. Place all used disposable facemasks, gloves, and other waste in a lined container before disposing them with other household waste. Remove gloves and wash your hands immediately after handling these items.  Do not share dishes, glasses, or other household items with the patient Avoid sharing household items. You should not share dishes, drinking glasses, cups, eating utensils, towels, bedding, or other items with a patient who is confirmed to have, or being evaluated for, COVID-19 infection. After the person uses these items, you should wash them thoroughly with soap and water.  Wash laundry thoroughly Immediately remove and wash clothes or bedding that have blood, body fluids, and/or secretions or excretions, such as sweat, saliva, sputum, nasal mucus, vomit, urine, or feces, on them. Wear gloves when handling laundry from the patient. Read and follow directions on labels of laundry or clothing items and detergent. In general, wash and dry with the warmest temperatures recommended on the label.  Clean all areas the individual has used  often Clean all touchable surfaces, such as counters, tabletops, doorknobs, bathroom fixtures, toilets, phones, keyboards, tablets, and bedside tables, every day. Also, clean any surfaces that may have blood, body fluids, and/or secretions or excretions on them. Wear gloves when cleaning surfaces the patient has come in contact with. Use a diluted bleach solution (e.g., dilute bleach with 1 part bleach and 10 parts water) or a household disinfectant with a label that says EPA-registered for coronaviruses. To make a bleach solution at home, add 1 tablespoon of bleach to 1 quart (4 cups) of water. For a larger supply, add  cup of bleach to 1 gallon (16 cups) of water. Read labels of cleaning products and follow recommendations provided on product labels. Labels contain instructions for safe and effective use of the cleaning product including precautions you should take when applying the product, such as wearing gloves or eye protection and making sure you have good ventilation during use of the product. Remove gloves and wash hands immediately after cleaning.  Monitor yourself for signs and symptoms of illness Caregivers and household members are considered close contacts, should monitor their health, and will be asked to limit movement outside of the home to the extent possible. Follow the monitoring steps for close contacts listed on the symptom monitoring form.   ? If you have additional questions, contact your local health department or call the epidemiologist on call at [phone] (available 24/7). ? This guidance is subject to change. For the most up-to-date guidance from [facility], please refer to their website: [email or web address]   Increase activity slowly   Complete by: As directed      Allergies as of 07/13/2020      Reactions   Tetracyclines & Related Anaphylaxis, Rash   Ciprofloxacin    Trouble swallowing unknown reaction  according to wife       Medication List    STOP taking these medications   amLODipine 10 MG tablet Commonly known as: NORVASC   carvedilol 6.25 MG tablet Commonly known as: COREG   mirabegron ER 25 MG Tb24 tablet Commonly known as: Myrbetriq   sulfamethoxazole-trimethoprim 800-160 MG tablet Commonly known as: BACTRIM DS     TAKE these medications   acetaminophen 325 MG tablet Commonly known as: TYLENOL  Take 2 tablets (650 mg total) by mouth every 6 (six) hours as needed for mild pain or headache (fever >/= 101).   ascorbic acid 500 MG tablet Commonly known as: VITAMIN C Take 1 tablet (500 mg total) by mouth 2 (two) times daily.   cephALEXin 250 MG capsule Commonly known as: KEFLEX Take 1 capsule (250 mg total) by mouth at bedtime.   diazepam 5 MG tablet Commonly known as: VALIUM Take 1 tablet (5 mg total) by mouth every 12 (twelve) hours as needed for muscle spasms. What changed:   how much to take  how to take this  when to take this  reasons to take this  additional instructions   guaiFENesin-dextromethorphan 100-10 MG/5ML syrup Commonly known as: ROBITUSSIN DM Take 10 mLs by mouth every 4 (four) hours as needed for cough.   HYDROcodone-acetaminophen 7.5-325 MG tablet Commonly known as: NORCO Take 1 tablet by mouth 2 (two) times daily. Max APAP 3 GM IN 24 HOURS FROM ALL SOURCES What changed:   when to take this  additional instructions   Ipratropium-Albuterol 20-100 MCG/ACT Aers respimat Commonly known as: COMBIVENT Inhale 1 puff into the lungs every 6 (six) hours as needed for wheezing.   levothyroxine 75 MCG tablet Commonly known as: SYNTHROID Take 75 mcg by mouth daily before breakfast. ALL MEDICATIONS TO BE CRUSHED AND PLACED IN APPLESAUCE   linagliptin 5 MG Tabs tablet Commonly known as: TRADJENTA Take 1 tablet (5 mg total) by mouth daily. Start taking on: July 14, 2020   multivitamin with minerals Tabs tablet Take 1 tablet by mouth  daily. Start taking on: July 14, 2020   Resource ThickenUp Clear Powd Use as directed            [employer]  (From admission, onward)         Start     Ordered   07/13/20 1206  For home use only DME oxygen  Once       Comments: SATURATION QUALIFICATIONS: (This note is used to comply with regulatory documentation for home oxygen) Patient Saturations on Room Air at Rest = 80% Patient Saturations on 2 Liters of oxygen at rest = 93% Patient Saturations on 2 Liters of oxygen while Ambulating = 82% -Patient meets criteria for home O2 -2 L O2 titrate to ensure SPO2> 88% -Provide Inogen home O2 portable concentrator  Question Answer Comment  Length of Need Lifetime   Mode or (Route) Nasal cannula   Liters per Minute 2   Oxygen conserving device Yes   Oxygen delivery system Gas      07/13/20 1206           Discharge Care Instructions  (From admission, onward)         Start     Ordered   07/13/20 0000  Change dressing (specify)       Comments: Per instructions   07/13/20 1403         Allergies  Allergen Reactions   Tetracyclines & Related Anaphylaxis and Rash   Ciprofloxacin     Trouble swallowing unknown reaction according to wife     Contact information for after-discharge care    Destination    [facility]erred [facility] .   Service: Skilled Nursing Contact information: [place] [phone]                   The results of significant diagnostics from this hospitalization (including imaging, microbiology, ancillary  and laboratory) are listed below for reference.    Significant Diagnostic Studies: CT CHEST WO CONTRAST  Result Date: 07/10/2020 CLINICAL DATA:  Chest pain. EXAM: CT CHEST WITHOUT CONTRAST TECHNIQUE: Multidetector CT imaging of the chest was performed following the standard protocol without IV contrast. COMPARISON:  January 05, 2012. FINDINGS: Cardiovascular:  Atherosclerosis of thoracic aorta is noted. Stable focal aneurysmal bulge is seen involving the lateral aspect of the transverse aortic arch. Normal cardiac size. No pericardial effusion. Coronary artery calcifications are noted. Mediastinum/Nodes: No enlarged mediastinal or axillary lymph nodes. Thyroid gland, trachea, and esophagus demonstrate no significant findings. Lungs/Pleura: No pneumothorax or pleural effusion is noted. Mild bronchiectasis is noted in both lower lobes with probable peripheral honeycombing seen in the lung bases suggesting pulmonary fibrosis. There appears to be mild bibasilar ill-defined opacity is noted concerning for subsegmental atelectasis or possibly infiltrates. Upper Abdomen: Left hepatic pneumobilia is noted. This may be due to recent instrumentation or incompetent sphincter of Oddi. Severe right renal atrophy is noted. Left renal cysts are noted. Musculoskeletal: No chest [place] mass or suspicious bone lesions identified. IMPRESSION: 1. Coronary artery calcifications are noted suggesting coronary artery disease. 2. Stable focal aneurysmal bulge is seen involving the lateral aspect of the transverse aortic arch. 3. Mild bronchiectasis is noted in both lower lobes with probable peripheral honeycombing seen in the lung bases suggesting pulmonary fibrosis. 4. There appears to be mild bibasilar ill-defined opacity concerning for subsegmental atelectasis or possibly infiltrates. 5. Severe right renal atrophy is noted. 6. Left hepatic pneumobilia is noted. This may be due to recent instrumentation or incompetent sphincter of Oddi. 7. Aortic atherosclerosis. Aortic Atherosclerosis (ICD10-I70.0). Electronically Signed   By: Marijo Conception M.D.   On: 07/10/2020 17:59   US RENAL  Result Date: 07/05/2020 CLINICAL DATA:  Acute kidney injury. EXAM: RENAL / URINARY TRACT ULTRASOUND COMPLETE COMPARISON:  CT abdomen pelvis 05/02/2020 FINDINGS: Right Kidney: Renal measurements: 6.7 x 3.2 x 3.4 cm  = volume: 38 mL. Echogenicity within normal limits. There are two small cysts. No hydronephrosis. No shadowing calculi. Left Kidney: Renal measurements: 14.4 x 7.7 x 7.4 cm = volume: 431 mL. Echogenicity within normal limits. There is severe hydronephrosis. There is a shadowing calculus in the inferior pole measuring 1.1 cm Bladder: Decompressed with Foley in place. Other: None. IMPRESSION: 1. Atrophic right kidney with increased cortical echogenicity consistent with medical renal disease. Right renal cysts. 2.  Severe left hydronephrosis.  Left renal stone measuring 1.1 cm. Electronically Signed   By: Audie Pinto M.D.   On: 07/05/2020 15:43   DG CHEST PORT 1 VIEW  Result Date: 07/08/2020 CLINICAL DATA:  Shortness of breath EXAM: PORTABLE CHEST 1 VIEW COMPARISON:  July 05, 2020, January 05, 2012 FINDINGS: The cardiomediastinal silhouette is unchanged in contour.Emphysematous changes. No pleural effusion. No pneumothorax. Coarse bibasilar reticular opacities likely reflecting likely reflecting underlying interstitial lung disease. RIGHT apical calcified nodular opacity. Visualized abdomen is unremarkable. Multilevel degenerative changes of the thoracic spine. IMPRESSION: 1. Coarse bibasilar reticular opacities likely reflecting underlying interstitial lung disease on a background of emphysema. Electronically Signed   By: Valentino Saxon MD   On: 07/08/2020 17:08   DG CHEST PORT 1 VIEW  Result Date: 07/05/2020 CLINICAL DATA:  Shortness of breath.  COVID. EXAM: PORTABLE CHEST 1 VIEW COMPARISON:  07/03/2020. 07/11/2017. No pleural effusion or pneumothorax. FINDINGS: Mediastinum hilar structures normal. Mild infiltrate is noted in the right upper lung and right mid lung. Chronic interstitial changes again noted.  Heart size normal. No acute bony abnormality. IMPRESSION: Mild infiltrate right upper lung and right mid lung. Chronic interstitial lung disease. Electronically Signed   By: Marcello Moores  Register    On: 07/05/2020 06:30   DG Chest Port 1 View  Result Date: 07/03/2020 CLINICAL DATA:  Shortness of breath EXAM: PORTABLE CHEST 1 VIEW COMPARISON:  May 02, 2020 FINDINGS: There is patchy ill-defined opacity in the right mid lung and right base regions. Lungs elsewhere are clear. Heart size and pulmonary vascularity are normal. No adenopathy. No bone lesions. IMPRESSION: Ill-defined opacity right mid lung and right base regions, likely representing atypical organism pneumonia. Lungs elsewhere clear. Heart size normal. No adenopathy evident. Electronically Signed   By: Lowella Grip III M.D.   On: 07/03/2020 12:43   DG Abd Portable 1V  Result Date: 07/05/2020 CLINICAL DATA:  79 year old male with left-sided hydronephrosis. EXAM: PORTABLE ABDOMEN - 1 VIEW COMPARISON:  CT abdomen pelvis dated 05/02/2020. FINDINGS: Left pigtail ureteral stent. The previously seen 1 cm stone in the inferior pole of the left kidney is not visualized on this study. Linear calcific densities over the left renal silhouette, likely vascular calcification. There is no bowel dilatation or evidence of obstruction. No free air. Right upper quadrant cholecystectomy clips. Osteopenia with degenerative changes of the spine. No acute osseous pathology. IMPRESSION: Left pigtail ureteral stent.  No stone identified. Electronically Signed   By: Anner Crete M.D.   On: 07/05/2020 21:16   VAS [place] LOWER EXTREMITY VENOUS (DVT)  Result Date: 07/05/2020  Lower Venous DVT Study Indications: Covid-19, elevated D-Dimer.  Comparison Study: No prior study Performing Technologist: Sharion Dove RVS  Examination Guidelines: A complete evaluation includes B-mode imaging, spectral Doppler, color Doppler, and power Doppler as needed of all accessible portions of each vessel. Bilateral testing is considered an integral part of a complete examination. Limited examinations for reoccurring indications may be performed as noted. The reflux portion of the  exam is performed with the patient in reverse Trendelenburg.  +---------+---------------+---------+-----------+----------+--------------+  RIGHT     Compressibility Phasicity Spontaneity Properties Thrombus Aging  +---------+---------------+---------+-----------+----------+--------------+  CFV       Full            Yes       Yes                                    +---------+---------------+---------+-----------+----------+--------------+  SFJ       Full                                                             +---------+---------------+---------+-----------+----------+--------------+  FV Prox   Full                                                             +---------+---------------+---------+-----------+----------+--------------+  FV Mid    Full                                                             +---------+---------------+---------+-----------+----------+--------------+  FV Distal Full                                                             +---------+---------------+---------+-----------+----------+--------------+  PFV       Full                                                             +---------+---------------+---------+-----------+----------+--------------+  POP       Full            Yes       Yes                                    +---------+---------------+---------+-----------+----------+--------------+  PTV       Full                                                             +---------+---------------+---------+-----------+----------+--------------+  PERO      Full                                                             +---------+---------------+---------+-----------+----------+--------------+   +---------+---------------+---------+-----------+----------+--------------+  LEFT      Compressibility Phasicity Spontaneity Properties Thrombus Aging  +---------+---------------+---------+-----------+----------+--------------+  CFV       Full            Yes       Yes                                     +---------+---------------+---------+-----------+----------+--------------+  SFJ       Full                                                             +---------+---------------+---------+-----------+----------+--------------+  FV Prox   Full                                                             +---------+---------------+---------+-----------+----------+--------------+  FV Mid    Full                                                             +---------+---------------+---------+-----------+----------+--------------+  FV Distal Full                                                             +---------+---------------+---------+-----------+----------+--------------+  PFV       Full                                                             +---------+---------------+---------+-----------+----------+--------------+  POP       Full            Yes       Yes                                    +---------+---------------+---------+-----------+----------+--------------+  PTV       Full                                                             +---------+---------------+---------+-----------+----------+--------------+  PERO      Full                                                             +---------+---------------+---------+-----------+----------+--------------+     Summary: BILATERAL: - No evidence of deep vein thrombosis seen in the lower extremities, bilaterally. -   *See table(s) above for measurements and observations. Electronically signed by Monica Martinez MD on 07/05/2020 at 3:25:56 PM.    Final     Microbiology: Recent Results (from the past 240 hour(s))  Culture, Urine     Status: Abnormal   Collection Time: 07/04/20  7:56 PM   Specimen: Urine, Clean Catch  Result Value Ref Range Status   Specimen Description   Final    URINE, CLEAN CATCH Performed at [facility] [address], [place], [place] [id number]    Special Requests   Final     NONE Performed at [facility], [place] [address], [place], [place] [address]    Culture MULTIPLE SPECIES PRESENT, SUGGEST RECOLLECTION (A)  Final   Report Status 07/06/2020 FINAL  Final  SARS Coronavirus 2 by RT PCR (hospital order, performed in [facility] hospital lab) Nasopharyngeal Nasopharyngeal Swab     Status: Abnormal   Collection Time: 07/12/20  4:30 PM   Specimen: Nasopharyngeal Swab  Result Value Ref Range Status   SARS Coronavirus 2 POSITIVE (A) NEGATIVE Final    Comment: RESULT CALLED TO, READ BACK BY AND VERIFIED WITH: STOKES,C. RN @1758  ON 10.14.2021 BY COHEN,K (NOTE) SARS-CoV-2 target nucleic acids are DETECTED  SARS-CoV-2 RNA is generally detectable in upper respiratory specimens  during the acute phase of infection.  Positive results are indicative  of the presence of  the identified virus, but do not rule out bacterial infection or co-infection with other pathogens not detected by the test.  Clinical correlation with patient history and  other diagnostic information is necessary to determine patient infection status.  The expected result is negative.  Fact Sheet for Patients:   StrictlyIdeas.no   Fact Sheet for Healthcare Providers:   BankingDealers.co.za    This test is not yet approved or cleared by the [place] FDA and  has been authorized for detection and/or diagnosis of SARS-CoV-2 by FDA under an Emergency Use Authorization (EUA).  This EUA will remain in effect (meanin g this test can be used) for the duration of  the COVID-19 declaration under Section 564(b)(1) of the Act, 21 U.S.C. section 360-bbb-3(b)(1), unless the authorization is terminated or revoked sooner.  Performed at [facility], [place] [address] [place], [place] [address]      Labs: Basic Metabolic Panel: Recent Labs  Lab 07/07/20 0300 07/07/20 0300 07/08/20 0414 07/09/20 0431 07/10/20 0414  07/11/20 0423 07/12/20 0426  NA 153*   < > 148* 144 143 146* 145  K 3.2*   < > 3.5 4.3 5.0 4.9 5.1  CL 114*   < > 109 107 109 108 111  CO2 26   < > 28 26 26 27 25   GLUCOSE 189*   < > 185* 176* 144* 156* 164*  BUN 59*   < > 52* 41* 40* 50* 55*  CREATININE 1.60*   < > 1.48* 1.29* 1.40* 1.60* 1.64*  CALCIUM 8.7*   < > 8.5* 8.7* 8.2* 9.0 8.6*  MG  --   --   --  2.0 2.2 2.6* 2.6*  PHOS 2.1*  --   --  2.9 4.2 3.1 3.5   < > = values in this interval not displayed.   Liver Function Tests: Recent Labs  Lab 07/08/20 0414 07/09/20 0431 07/10/20 0414 07/11/20 0423 07/12/20 0426  AST 28 24 19 17 16   ALT 28 24 19 19 21   ALKPHOS 64 62 62 62 58  BILITOT 0.7 0.5 0.4 0.7 0.5  PROT 6.5 6.1* 5.4* 5.8* 5.4*  ALBUMIN 2.8* 2.5* 2.3* 2.4* 2.3*   No results for input(s): LIPASE, AMYLASE in the last 168 hours. No results for input(s): AMMONIA in the last 168 hours. CBC: Recent Labs  Lab 07/08/20 0414 07/09/20 0431 07/10/20 0414 07/11/20 0423 07/12/20 0426  WBC 8.8 10.5 6.2 9.2 9.7  NEUTROABS 7.5 9.1* 5.3 8.4* 8.8*  HGB 11.7* 11.6* 10.4* 11.4* 10.5*  HCT 36.5* 36.2* 33.2* 35.6* 33.2*  MCV 96.3 95.0 97.6 95.2 98.5  PLT 121* 127* 109* 128* 124*   Cardiac Enzymes: No results for input(s): CKTOTAL, CKMB, CKMBINDEX, TROPONINI in the last 168 hours. BNP: BNP (last 3 results) No results for input(s): BNP in the last 8760 hours.  ProBNP (last 3 results) No results for input(s): PROBNP in the last 8760 hours.  CBG: Recent Labs  Lab 07/12/20 1122 07/12/20 1614 07/12/20 2046 07/13/20 0722 07/13/20 1157  GLUCAP 106* 144* 134* 136* 158*       Signed:  Dia Crawford, MD [facility] [phone] pager

## 2020-07-16 DIAGNOSIS — M6281 Muscle weakness (generalized): Secondary | ICD-10-CM | POA: Diagnosis not present

## 2020-07-16 DIAGNOSIS — M1990 Unspecified osteoarthritis, unspecified site: Secondary | ICD-10-CM | POA: Diagnosis not present

## 2020-07-16 DIAGNOSIS — E440 Moderate protein-calorie malnutrition: Secondary | ICD-10-CM | POA: Diagnosis not present

## 2020-07-16 DIAGNOSIS — J9600 Acute respiratory failure, unspecified whether with hypoxia or hypercapnia: Secondary | ICD-10-CM | POA: Diagnosis not present

## 2020-07-16 DIAGNOSIS — G4733 Obstructive sleep apnea (adult) (pediatric): Secondary | ICD-10-CM | POA: Diagnosis not present

## 2020-07-16 DIAGNOSIS — G35 Multiple sclerosis: Secondary | ICD-10-CM | POA: Diagnosis not present

## 2020-07-16 DIAGNOSIS — I2510 Atherosclerotic heart disease of native coronary artery without angina pectoris: Secondary | ICD-10-CM | POA: Diagnosis not present

## 2020-07-16 DIAGNOSIS — U071 COVID-19: Secondary | ICD-10-CM | POA: Diagnosis not present

## 2020-07-16 DIAGNOSIS — I10 Essential (primary) hypertension: Secondary | ICD-10-CM | POA: Diagnosis not present

## 2020-07-16 DIAGNOSIS — R2681 Unsteadiness on feet: Secondary | ICD-10-CM | POA: Diagnosis not present

## 2020-07-16 DIAGNOSIS — E039 Hypothyroidism, unspecified: Secondary | ICD-10-CM | POA: Diagnosis not present

## 2020-07-16 DIAGNOSIS — D649 Anemia, unspecified: Secondary | ICD-10-CM | POA: Diagnosis not present

## 2020-07-17 DIAGNOSIS — G9349 Other encephalopathy: Secondary | ICD-10-CM | POA: Diagnosis not present

## 2020-07-17 DIAGNOSIS — I5032 Chronic diastolic (congestive) heart failure: Secondary | ICD-10-CM | POA: Diagnosis not present

## 2020-07-17 DIAGNOSIS — J9601 Acute respiratory failure with hypoxia: Secondary | ICD-10-CM | POA: Diagnosis not present

## 2020-07-17 DIAGNOSIS — U071 COVID-19: Secondary | ICD-10-CM | POA: Diagnosis not present

## 2020-07-19 DIAGNOSIS — I10 Essential (primary) hypertension: Secondary | ICD-10-CM | POA: Diagnosis not present

## 2020-07-19 DIAGNOSIS — E039 Hypothyroidism, unspecified: Secondary | ICD-10-CM | POA: Diagnosis not present

## 2020-07-19 DIAGNOSIS — M1990 Unspecified osteoarthritis, unspecified site: Secondary | ICD-10-CM | POA: Diagnosis not present

## 2020-07-19 DIAGNOSIS — E440 Moderate protein-calorie malnutrition: Secondary | ICD-10-CM | POA: Diagnosis not present

## 2020-07-19 DIAGNOSIS — U071 COVID-19: Secondary | ICD-10-CM | POA: Diagnosis not present

## 2020-07-19 DIAGNOSIS — M6281 Muscle weakness (generalized): Secondary | ICD-10-CM | POA: Diagnosis not present

## 2020-07-19 DIAGNOSIS — G4733 Obstructive sleep apnea (adult) (pediatric): Secondary | ICD-10-CM | POA: Diagnosis not present

## 2020-07-19 DIAGNOSIS — D649 Anemia, unspecified: Secondary | ICD-10-CM | POA: Diagnosis not present

## 2020-07-19 DIAGNOSIS — G35 Multiple sclerosis: Secondary | ICD-10-CM | POA: Diagnosis not present

## 2020-07-19 DIAGNOSIS — R2681 Unsteadiness on feet: Secondary | ICD-10-CM | POA: Diagnosis not present

## 2020-07-19 DIAGNOSIS — I2510 Atherosclerotic heart disease of native coronary artery without angina pectoris: Secondary | ICD-10-CM | POA: Diagnosis not present

## 2020-07-19 DIAGNOSIS — J9600 Acute respiratory failure, unspecified whether with hypoxia or hypercapnia: Secondary | ICD-10-CM | POA: Diagnosis not present

## 2020-07-20 DIAGNOSIS — D649 Anemia, unspecified: Secondary | ICD-10-CM | POA: Diagnosis not present

## 2020-07-20 DIAGNOSIS — U071 COVID-19: Secondary | ICD-10-CM | POA: Diagnosis not present

## 2020-07-20 DIAGNOSIS — D696 Thrombocytopenia, unspecified: Secondary | ICD-10-CM | POA: Diagnosis not present

## 2020-07-20 DIAGNOSIS — J9601 Acute respiratory failure with hypoxia: Secondary | ICD-10-CM | POA: Diagnosis not present

## 2020-07-24 ENCOUNTER — Other Ambulatory Visit: Payer: Medicare Other | Admitting: Urology

## 2020-07-24 DIAGNOSIS — E039 Hypothyroidism, unspecified: Secondary | ICD-10-CM | POA: Diagnosis not present

## 2020-07-24 DIAGNOSIS — D649 Anemia, unspecified: Secondary | ICD-10-CM | POA: Diagnosis not present

## 2020-07-24 DIAGNOSIS — I10 Essential (primary) hypertension: Secondary | ICD-10-CM | POA: Diagnosis not present

## 2020-07-24 DIAGNOSIS — E440 Moderate protein-calorie malnutrition: Secondary | ICD-10-CM | POA: Diagnosis not present

## 2020-07-24 DIAGNOSIS — U071 COVID-19: Secondary | ICD-10-CM | POA: Diagnosis not present

## 2020-07-24 DIAGNOSIS — M6281 Muscle weakness (generalized): Secondary | ICD-10-CM | POA: Diagnosis not present

## 2020-07-24 DIAGNOSIS — J9600 Acute respiratory failure, unspecified whether with hypoxia or hypercapnia: Secondary | ICD-10-CM | POA: Diagnosis not present

## 2020-07-24 DIAGNOSIS — G35 Multiple sclerosis: Secondary | ICD-10-CM | POA: Diagnosis not present

## 2020-07-24 DIAGNOSIS — G4733 Obstructive sleep apnea (adult) (pediatric): Secondary | ICD-10-CM | POA: Diagnosis not present

## 2020-07-24 DIAGNOSIS — I2510 Atherosclerotic heart disease of native coronary artery without angina pectoris: Secondary | ICD-10-CM | POA: Diagnosis not present

## 2020-07-24 DIAGNOSIS — M1990 Unspecified osteoarthritis, unspecified site: Secondary | ICD-10-CM | POA: Diagnosis not present

## 2020-07-24 DIAGNOSIS — R2681 Unsteadiness on feet: Secondary | ICD-10-CM | POA: Diagnosis not present

## 2020-07-27 DIAGNOSIS — G4733 Obstructive sleep apnea (adult) (pediatric): Secondary | ICD-10-CM | POA: Diagnosis not present

## 2020-07-27 DIAGNOSIS — M6281 Muscle weakness (generalized): Secondary | ICD-10-CM | POA: Diagnosis not present

## 2020-07-27 DIAGNOSIS — U071 COVID-19: Secondary | ICD-10-CM | POA: Diagnosis not present

## 2020-07-27 DIAGNOSIS — J9600 Acute respiratory failure, unspecified whether with hypoxia or hypercapnia: Secondary | ICD-10-CM | POA: Diagnosis not present

## 2020-07-27 DIAGNOSIS — E440 Moderate protein-calorie malnutrition: Secondary | ICD-10-CM | POA: Diagnosis not present

## 2020-07-27 DIAGNOSIS — G35 Multiple sclerosis: Secondary | ICD-10-CM | POA: Diagnosis not present

## 2020-07-27 DIAGNOSIS — E039 Hypothyroidism, unspecified: Secondary | ICD-10-CM | POA: Diagnosis not present

## 2020-07-27 DIAGNOSIS — I2510 Atherosclerotic heart disease of native coronary artery without angina pectoris: Secondary | ICD-10-CM | POA: Diagnosis not present

## 2020-07-27 DIAGNOSIS — D649 Anemia, unspecified: Secondary | ICD-10-CM | POA: Diagnosis not present

## 2020-07-27 DIAGNOSIS — M1990 Unspecified osteoarthritis, unspecified site: Secondary | ICD-10-CM | POA: Diagnosis not present

## 2020-07-27 DIAGNOSIS — I10 Essential (primary) hypertension: Secondary | ICD-10-CM | POA: Diagnosis not present

## 2020-07-27 DIAGNOSIS — R2681 Unsteadiness on feet: Secondary | ICD-10-CM | POA: Diagnosis not present

## 2020-07-30 DIAGNOSIS — J9600 Acute respiratory failure, unspecified whether with hypoxia or hypercapnia: Secondary | ICD-10-CM | POA: Diagnosis not present

## 2020-07-30 DIAGNOSIS — M1990 Unspecified osteoarthritis, unspecified site: Secondary | ICD-10-CM | POA: Diagnosis not present

## 2020-07-30 DIAGNOSIS — G4733 Obstructive sleep apnea (adult) (pediatric): Secondary | ICD-10-CM | POA: Diagnosis not present

## 2020-07-30 DIAGNOSIS — I2510 Atherosclerotic heart disease of native coronary artery without angina pectoris: Secondary | ICD-10-CM | POA: Diagnosis not present

## 2020-07-30 DIAGNOSIS — G35 Multiple sclerosis: Secondary | ICD-10-CM | POA: Diagnosis not present

## 2020-07-30 DIAGNOSIS — M6281 Muscle weakness (generalized): Secondary | ICD-10-CM | POA: Diagnosis not present

## 2020-07-30 DIAGNOSIS — D649 Anemia, unspecified: Secondary | ICD-10-CM | POA: Diagnosis not present

## 2020-07-30 DIAGNOSIS — R2681 Unsteadiness on feet: Secondary | ICD-10-CM | POA: Diagnosis not present

## 2020-07-30 DIAGNOSIS — E440 Moderate protein-calorie malnutrition: Secondary | ICD-10-CM | POA: Diagnosis not present

## 2020-07-30 DIAGNOSIS — I10 Essential (primary) hypertension: Secondary | ICD-10-CM | POA: Diagnosis not present

## 2020-07-30 DIAGNOSIS — U071 COVID-19: Secondary | ICD-10-CM | POA: Diagnosis not present

## 2020-07-30 DIAGNOSIS — E039 Hypothyroidism, unspecified: Secondary | ICD-10-CM | POA: Diagnosis not present

## 2020-07-31 ENCOUNTER — Other Ambulatory Visit: Payer: Self-pay | Admitting: *Deleted

## 2020-07-31 NOTE — Patient Outreach (Signed)
Member screened for potential [facility] Care Management needs as a benefit of [employer].  Per Patient Ping member resides in [facility].   Communication sent to [facility] SWs to collaborate about anticipated dc plans and potential [facility] Care Management needs.  Will continue to follow while member resides in SNF.   Jaylan Hinojosa, MSN-Ed, RN,BSN [facility] Post Acute Care Coordinator [phone] ( Business Mobile) [phone]  (Toll free office)  

## 2020-08-02 ENCOUNTER — Other Ambulatory Visit: Payer: Self-pay | Admitting: *Deleted

## 2020-08-02 DIAGNOSIS — U071 COVID-19: Secondary | ICD-10-CM | POA: Diagnosis not present

## 2020-08-02 DIAGNOSIS — I10 Essential (primary) hypertension: Secondary | ICD-10-CM | POA: Diagnosis not present

## 2020-08-02 DIAGNOSIS — M6281 Muscle weakness (generalized): Secondary | ICD-10-CM | POA: Diagnosis not present

## 2020-08-02 DIAGNOSIS — I2510 Atherosclerotic heart disease of native coronary artery without angina pectoris: Secondary | ICD-10-CM | POA: Diagnosis not present

## 2020-08-02 DIAGNOSIS — R2230 Localized swelling, mass and lump, unspecified upper limb: Secondary | ICD-10-CM | POA: Diagnosis not present

## 2020-08-02 DIAGNOSIS — E039 Hypothyroidism, unspecified: Secondary | ICD-10-CM | POA: Diagnosis not present

## 2020-08-02 DIAGNOSIS — J9600 Acute respiratory failure, unspecified whether with hypoxia or hypercapnia: Secondary | ICD-10-CM | POA: Diagnosis not present

## 2020-08-02 DIAGNOSIS — G4733 Obstructive sleep apnea (adult) (pediatric): Secondary | ICD-10-CM | POA: Diagnosis not present

## 2020-08-02 DIAGNOSIS — M1990 Unspecified osteoarthritis, unspecified site: Secondary | ICD-10-CM | POA: Diagnosis not present

## 2020-08-02 DIAGNOSIS — E440 Moderate protein-calorie malnutrition: Secondary | ICD-10-CM | POA: Diagnosis not present

## 2020-08-02 DIAGNOSIS — R2681 Unsteadiness on feet: Secondary | ICD-10-CM | POA: Diagnosis not present

## 2020-08-02 DIAGNOSIS — G35 Multiple sclerosis: Secondary | ICD-10-CM | POA: Diagnosis not present

## 2020-08-02 DIAGNOSIS — D649 Anemia, unspecified: Secondary | ICD-10-CM | POA: Diagnosis not present

## 2020-08-02 NOTE — Patient Outreach (Signed)
[place] Coordinator follow up. Member screened for potential [facility] Care Management needs as a benefit of [place] Medicare.  Update received from [place] indicating member continues to have confusion. He remains on oxygen and nectar thick liquids.  Will follow continue to follow transition plans and for potential [facility] Care Management needs.   Marthenia Rolling, MSN-Ed, RN,BSN [place] Acute Care Coordinator [phone] [facility]) [phone]  (Toll free office)

## 2020-08-03 DIAGNOSIS — I110 Hypertensive heart disease with heart failure: Secondary | ICD-10-CM | POA: Diagnosis not present

## 2020-08-03 DIAGNOSIS — I5032 Chronic diastolic (congestive) heart failure: Secondary | ICD-10-CM | POA: Diagnosis not present

## 2020-08-07 DIAGNOSIS — M1990 Unspecified osteoarthritis, unspecified site: Secondary | ICD-10-CM | POA: Diagnosis not present

## 2020-08-07 DIAGNOSIS — I2510 Atherosclerotic heart disease of native coronary artery without angina pectoris: Secondary | ICD-10-CM | POA: Diagnosis not present

## 2020-08-07 DIAGNOSIS — M6281 Muscle weakness (generalized): Secondary | ICD-10-CM | POA: Diagnosis not present

## 2020-08-07 DIAGNOSIS — D649 Anemia, unspecified: Secondary | ICD-10-CM | POA: Diagnosis not present

## 2020-08-07 DIAGNOSIS — E039 Hypothyroidism, unspecified: Secondary | ICD-10-CM | POA: Diagnosis not present

## 2020-08-07 DIAGNOSIS — I10 Essential (primary) hypertension: Secondary | ICD-10-CM | POA: Diagnosis not present

## 2020-08-07 DIAGNOSIS — G35 Multiple sclerosis: Secondary | ICD-10-CM | POA: Diagnosis not present

## 2020-08-07 DIAGNOSIS — R2681 Unsteadiness on feet: Secondary | ICD-10-CM | POA: Diagnosis not present

## 2020-08-07 DIAGNOSIS — U071 COVID-19: Secondary | ICD-10-CM | POA: Diagnosis not present

## 2020-08-07 DIAGNOSIS — J9600 Acute respiratory failure, unspecified whether with hypoxia or hypercapnia: Secondary | ICD-10-CM | POA: Diagnosis not present

## 2020-08-07 DIAGNOSIS — E440 Moderate protein-calorie malnutrition: Secondary | ICD-10-CM | POA: Diagnosis not present

## 2020-08-07 DIAGNOSIS — G4733 Obstructive sleep apnea (adult) (pediatric): Secondary | ICD-10-CM | POA: Diagnosis not present

## 2020-08-09 DIAGNOSIS — U071 COVID-19: Secondary | ICD-10-CM | POA: Diagnosis not present

## 2020-08-09 DIAGNOSIS — D649 Anemia, unspecified: Secondary | ICD-10-CM | POA: Diagnosis not present

## 2020-08-09 DIAGNOSIS — J9600 Acute respiratory failure, unspecified whether with hypoxia or hypercapnia: Secondary | ICD-10-CM | POA: Diagnosis not present

## 2020-08-09 DIAGNOSIS — R2681 Unsteadiness on feet: Secondary | ICD-10-CM | POA: Diagnosis not present

## 2020-08-09 DIAGNOSIS — I2510 Atherosclerotic heart disease of native coronary artery without angina pectoris: Secondary | ICD-10-CM | POA: Diagnosis not present

## 2020-08-09 DIAGNOSIS — G4733 Obstructive sleep apnea (adult) (pediatric): Secondary | ICD-10-CM | POA: Diagnosis not present

## 2020-08-09 DIAGNOSIS — E039 Hypothyroidism, unspecified: Secondary | ICD-10-CM | POA: Diagnosis not present

## 2020-08-09 DIAGNOSIS — M1990 Unspecified osteoarthritis, unspecified site: Secondary | ICD-10-CM | POA: Diagnosis not present

## 2020-08-09 DIAGNOSIS — G35 Multiple sclerosis: Secondary | ICD-10-CM | POA: Diagnosis not present

## 2020-08-09 DIAGNOSIS — I10 Essential (primary) hypertension: Secondary | ICD-10-CM | POA: Diagnosis not present

## 2020-08-09 DIAGNOSIS — E440 Moderate protein-calorie malnutrition: Secondary | ICD-10-CM | POA: Diagnosis not present

## 2020-08-09 DIAGNOSIS — M6281 Muscle weakness (generalized): Secondary | ICD-10-CM | POA: Diagnosis not present

## 2020-08-10 DIAGNOSIS — I2510 Atherosclerotic heart disease of native coronary artery without angina pectoris: Secondary | ICD-10-CM | POA: Diagnosis not present

## 2020-08-10 DIAGNOSIS — I10 Essential (primary) hypertension: Secondary | ICD-10-CM | POA: Diagnosis not present

## 2020-08-10 DIAGNOSIS — M1990 Unspecified osteoarthritis, unspecified site: Secondary | ICD-10-CM | POA: Diagnosis not present

## 2020-08-10 DIAGNOSIS — D649 Anemia, unspecified: Secondary | ICD-10-CM | POA: Diagnosis not present

## 2020-08-10 DIAGNOSIS — R2681 Unsteadiness on feet: Secondary | ICD-10-CM | POA: Diagnosis not present

## 2020-08-10 DIAGNOSIS — G35 Multiple sclerosis: Secondary | ICD-10-CM | POA: Diagnosis not present

## 2020-08-10 DIAGNOSIS — J9601 Acute respiratory failure with hypoxia: Secondary | ICD-10-CM | POA: Diagnosis not present

## 2020-08-10 DIAGNOSIS — G4733 Obstructive sleep apnea (adult) (pediatric): Secondary | ICD-10-CM | POA: Diagnosis not present

## 2020-08-10 DIAGNOSIS — I5032 Chronic diastolic (congestive) heart failure: Secondary | ICD-10-CM | POA: Diagnosis not present

## 2020-08-10 DIAGNOSIS — J9600 Acute respiratory failure, unspecified whether with hypoxia or hypercapnia: Secondary | ICD-10-CM | POA: Diagnosis not present

## 2020-08-10 DIAGNOSIS — E039 Hypothyroidism, unspecified: Secondary | ICD-10-CM | POA: Diagnosis not present

## 2020-08-10 DIAGNOSIS — I110 Hypertensive heart disease with heart failure: Secondary | ICD-10-CM | POA: Diagnosis not present

## 2020-08-10 DIAGNOSIS — U071 COVID-19: Secondary | ICD-10-CM | POA: Diagnosis not present

## 2020-08-10 DIAGNOSIS — E440 Moderate protein-calorie malnutrition: Secondary | ICD-10-CM | POA: Diagnosis not present

## 2020-08-10 DIAGNOSIS — M6281 Muscle weakness (generalized): Secondary | ICD-10-CM | POA: Diagnosis not present

## 2020-08-13 DIAGNOSIS — I959 Hypotension, unspecified: Secondary | ICD-10-CM | POA: Diagnosis not present

## 2020-08-13 DIAGNOSIS — J9601 Acute respiratory failure with hypoxia: Secondary | ICD-10-CM | POA: Diagnosis not present

## 2020-08-13 DIAGNOSIS — J8410 Pulmonary fibrosis, unspecified: Secondary | ICD-10-CM | POA: Diagnosis not present

## 2020-08-13 DIAGNOSIS — G8929 Other chronic pain: Secondary | ICD-10-CM | POA: Diagnosis not present

## 2020-08-13 DIAGNOSIS — M1990 Unspecified osteoarthritis, unspecified site: Secondary | ICD-10-CM | POA: Diagnosis not present

## 2020-08-13 DIAGNOSIS — I69354 Hemiplegia and hemiparesis following cerebral infarction affecting left non-dominant side: Secondary | ICD-10-CM | POA: Diagnosis not present

## 2020-08-13 DIAGNOSIS — G4733 Obstructive sleep apnea (adult) (pediatric): Secondary | ICD-10-CM | POA: Diagnosis not present

## 2020-08-13 DIAGNOSIS — D631 Anemia in chronic kidney disease: Secondary | ICD-10-CM | POA: Diagnosis not present

## 2020-08-13 DIAGNOSIS — R1310 Dysphagia, unspecified: Secondary | ICD-10-CM | POA: Diagnosis not present

## 2020-08-13 DIAGNOSIS — U071 COVID-19: Secondary | ICD-10-CM | POA: Diagnosis not present

## 2020-08-13 DIAGNOSIS — G4700 Insomnia, unspecified: Secondary | ICD-10-CM | POA: Diagnosis not present

## 2020-08-13 DIAGNOSIS — N179 Acute kidney failure, unspecified: Secondary | ICD-10-CM | POA: Diagnosis not present

## 2020-08-13 DIAGNOSIS — I2510 Atherosclerotic heart disease of native coronary artery without angina pectoris: Secondary | ICD-10-CM | POA: Diagnosis not present

## 2020-08-13 DIAGNOSIS — M6259 Muscle wasting and atrophy, not elsewhere classified, multiple sites: Secondary | ICD-10-CM | POA: Diagnosis not present

## 2020-08-13 DIAGNOSIS — I130 Hypertensive heart and chronic kidney disease with heart failure and stage 1 through stage 4 chronic kidney disease, or unspecified chronic kidney disease: Secondary | ICD-10-CM | POA: Diagnosis not present

## 2020-08-13 DIAGNOSIS — M549 Dorsalgia, unspecified: Secondary | ICD-10-CM | POA: Diagnosis not present

## 2020-08-13 DIAGNOSIS — I471 Supraventricular tachycardia: Secondary | ICD-10-CM | POA: Diagnosis not present

## 2020-08-13 DIAGNOSIS — N1832 Chronic kidney disease, stage 3b: Secondary | ICD-10-CM | POA: Diagnosis not present

## 2020-08-13 DIAGNOSIS — B964 Proteus (mirabilis) (morganii) as the cause of diseases classified elsewhere: Secondary | ICD-10-CM | POA: Diagnosis not present

## 2020-08-13 DIAGNOSIS — L89152 Pressure ulcer of sacral region, stage 2: Secondary | ICD-10-CM | POA: Diagnosis not present

## 2020-08-13 DIAGNOSIS — G35 Multiple sclerosis: Secondary | ICD-10-CM | POA: Diagnosis not present

## 2020-08-13 DIAGNOSIS — R739 Hyperglycemia, unspecified: Secondary | ICD-10-CM | POA: Diagnosis not present

## 2020-08-13 DIAGNOSIS — I5032 Chronic diastolic (congestive) heart failure: Secondary | ICD-10-CM | POA: Diagnosis not present

## 2020-08-13 DIAGNOSIS — G9349 Other encephalopathy: Secondary | ICD-10-CM | POA: Diagnosis not present

## 2020-08-13 DIAGNOSIS — N136 Pyonephrosis: Secondary | ICD-10-CM | POA: Diagnosis not present

## 2020-08-13 DIAGNOSIS — J1282 Pneumonia due to coronavirus disease 2019: Secondary | ICD-10-CM | POA: Diagnosis not present

## 2020-08-14 DIAGNOSIS — L89152 Pressure ulcer of sacral region, stage 2: Secondary | ICD-10-CM | POA: Diagnosis not present

## 2020-08-14 DIAGNOSIS — J1282 Pneumonia due to coronavirus disease 2019: Secondary | ICD-10-CM | POA: Diagnosis not present

## 2020-08-14 DIAGNOSIS — U071 COVID-19: Secondary | ICD-10-CM | POA: Diagnosis not present

## 2020-08-14 DIAGNOSIS — J9601 Acute respiratory failure with hypoxia: Secondary | ICD-10-CM | POA: Diagnosis not present

## 2020-08-14 DIAGNOSIS — G35 Multiple sclerosis: Secondary | ICD-10-CM | POA: Diagnosis not present

## 2020-08-14 DIAGNOSIS — G9349 Other encephalopathy: Secondary | ICD-10-CM | POA: Diagnosis not present

## 2020-08-15 ENCOUNTER — Ambulatory Visit: Payer: Medicare Other | Admitting: Neurology

## 2020-08-16 DIAGNOSIS — J9601 Acute respiratory failure with hypoxia: Secondary | ICD-10-CM | POA: Diagnosis not present

## 2020-08-16 DIAGNOSIS — L89152 Pressure ulcer of sacral region, stage 2: Secondary | ICD-10-CM | POA: Diagnosis not present

## 2020-08-16 DIAGNOSIS — U071 COVID-19: Secondary | ICD-10-CM | POA: Diagnosis not present

## 2020-08-16 DIAGNOSIS — J1282 Pneumonia due to coronavirus disease 2019: Secondary | ICD-10-CM | POA: Diagnosis not present

## 2020-08-16 DIAGNOSIS — G9349 Other encephalopathy: Secondary | ICD-10-CM | POA: Diagnosis not present

## 2020-08-16 DIAGNOSIS — G35 Multiple sclerosis: Secondary | ICD-10-CM | POA: Diagnosis not present

## 2020-08-17 DIAGNOSIS — L89152 Pressure ulcer of sacral region, stage 2: Secondary | ICD-10-CM | POA: Diagnosis not present

## 2020-08-17 DIAGNOSIS — G9349 Other encephalopathy: Secondary | ICD-10-CM | POA: Diagnosis not present

## 2020-08-17 DIAGNOSIS — J1282 Pneumonia due to coronavirus disease 2019: Secondary | ICD-10-CM | POA: Diagnosis not present

## 2020-08-17 DIAGNOSIS — J9601 Acute respiratory failure with hypoxia: Secondary | ICD-10-CM | POA: Diagnosis not present

## 2020-08-17 DIAGNOSIS — G35 Multiple sclerosis: Secondary | ICD-10-CM | POA: Diagnosis not present

## 2020-08-17 DIAGNOSIS — U071 COVID-19: Secondary | ICD-10-CM | POA: Diagnosis not present

## 2020-08-19 DIAGNOSIS — G35 Multiple sclerosis: Secondary | ICD-10-CM | POA: Diagnosis not present

## 2020-08-19 DIAGNOSIS — U071 COVID-19: Secondary | ICD-10-CM | POA: Diagnosis not present

## 2020-08-19 DIAGNOSIS — G9349 Other encephalopathy: Secondary | ICD-10-CM | POA: Diagnosis not present

## 2020-08-19 DIAGNOSIS — J1282 Pneumonia due to coronavirus disease 2019: Secondary | ICD-10-CM | POA: Diagnosis not present

## 2020-08-19 DIAGNOSIS — L89152 Pressure ulcer of sacral region, stage 2: Secondary | ICD-10-CM | POA: Diagnosis not present

## 2020-08-19 DIAGNOSIS — J9601 Acute respiratory failure with hypoxia: Secondary | ICD-10-CM | POA: Diagnosis not present

## 2020-08-20 ENCOUNTER — Telehealth: Payer: Self-pay

## 2020-08-20 DIAGNOSIS — U071 COVID-19: Secondary | ICD-10-CM | POA: Diagnosis not present

## 2020-08-20 DIAGNOSIS — L89152 Pressure ulcer of sacral region, stage 2: Secondary | ICD-10-CM | POA: Diagnosis not present

## 2020-08-20 DIAGNOSIS — G35 Multiple sclerosis: Secondary | ICD-10-CM | POA: Diagnosis not present

## 2020-08-20 DIAGNOSIS — J9601 Acute respiratory failure with hypoxia: Secondary | ICD-10-CM | POA: Diagnosis not present

## 2020-08-20 DIAGNOSIS — G9349 Other encephalopathy: Secondary | ICD-10-CM | POA: Diagnosis not present

## 2020-08-20 DIAGNOSIS — J1282 Pneumonia due to coronavirus disease 2019: Secondary | ICD-10-CM | POA: Diagnosis not present

## 2020-08-20 NOTE — Telephone Encounter (Signed)
Received call from [facility], Caryl Pina saying pts cath had not been changed since Oct. He was just Covid pos and placed in a facility and is now home. They called Dr. Durene Cal office and they told them to take him to ER. Pt refused to go. I spoke with drt. And she said there wa no urine smell, urine was clear with  no cloudiness and his output was good. I spoke with Amanda,L. RN, and told drt pt needs to go to ER. Drt says pt refuses. Drt expressed understanding.

## 2020-08-21 ENCOUNTER — Ambulatory Visit: Payer: Medicare Other | Admitting: Neurology

## 2020-08-22 ENCOUNTER — Ambulatory Visit: Payer: Medicare Other | Admitting: Neurology

## 2020-08-22 DIAGNOSIS — G9349 Other encephalopathy: Secondary | ICD-10-CM | POA: Diagnosis not present

## 2020-08-22 DIAGNOSIS — J9601 Acute respiratory failure with hypoxia: Secondary | ICD-10-CM | POA: Diagnosis not present

## 2020-08-22 DIAGNOSIS — L89152 Pressure ulcer of sacral region, stage 2: Secondary | ICD-10-CM | POA: Diagnosis not present

## 2020-08-22 DIAGNOSIS — J1282 Pneumonia due to coronavirus disease 2019: Secondary | ICD-10-CM | POA: Diagnosis not present

## 2020-08-22 DIAGNOSIS — U071 COVID-19: Secondary | ICD-10-CM | POA: Diagnosis not present

## 2020-08-22 DIAGNOSIS — G35 Multiple sclerosis: Secondary | ICD-10-CM | POA: Diagnosis not present

## 2020-08-24 DIAGNOSIS — L89152 Pressure ulcer of sacral region, stage 2: Secondary | ICD-10-CM | POA: Diagnosis not present

## 2020-08-24 DIAGNOSIS — J1282 Pneumonia due to coronavirus disease 2019: Secondary | ICD-10-CM | POA: Diagnosis not present

## 2020-08-24 DIAGNOSIS — J9601 Acute respiratory failure with hypoxia: Secondary | ICD-10-CM | POA: Diagnosis not present

## 2020-08-24 DIAGNOSIS — U071 COVID-19: Secondary | ICD-10-CM | POA: Diagnosis not present

## 2020-08-24 DIAGNOSIS — G9349 Other encephalopathy: Secondary | ICD-10-CM | POA: Diagnosis not present

## 2020-08-24 DIAGNOSIS — G35 Multiple sclerosis: Secondary | ICD-10-CM | POA: Diagnosis not present

## 2020-08-27 DIAGNOSIS — G35 Multiple sclerosis: Secondary | ICD-10-CM | POA: Diagnosis not present

## 2020-08-27 DIAGNOSIS — U071 COVID-19: Secondary | ICD-10-CM | POA: Diagnosis not present

## 2020-08-27 DIAGNOSIS — J1282 Pneumonia due to coronavirus disease 2019: Secondary | ICD-10-CM | POA: Diagnosis not present

## 2020-08-27 DIAGNOSIS — L89152 Pressure ulcer of sacral region, stage 2: Secondary | ICD-10-CM | POA: Diagnosis not present

## 2020-08-27 DIAGNOSIS — G9349 Other encephalopathy: Secondary | ICD-10-CM | POA: Diagnosis not present

## 2020-08-27 DIAGNOSIS — J9601 Acute respiratory failure with hypoxia: Secondary | ICD-10-CM | POA: Diagnosis not present

## 2020-08-28 ENCOUNTER — Telehealth: Payer: Self-pay

## 2020-08-28 DIAGNOSIS — J1282 Pneumonia due to coronavirus disease 2019: Secondary | ICD-10-CM | POA: Diagnosis not present

## 2020-08-28 DIAGNOSIS — G9349 Other encephalopathy: Secondary | ICD-10-CM | POA: Diagnosis not present

## 2020-08-28 DIAGNOSIS — J9601 Acute respiratory failure with hypoxia: Secondary | ICD-10-CM | POA: Diagnosis not present

## 2020-08-28 DIAGNOSIS — U071 COVID-19: Secondary | ICD-10-CM | POA: Diagnosis not present

## 2020-08-28 DIAGNOSIS — G35 Multiple sclerosis: Secondary | ICD-10-CM | POA: Diagnosis not present

## 2020-08-28 DIAGNOSIS — L89152 Pressure ulcer of sacral region, stage 2: Secondary | ICD-10-CM | POA: Diagnosis not present

## 2020-08-28 NOTE — Telephone Encounter (Signed)
Dr. Halford Decamp verbal order to [facility] to cchange cath.

## 2020-08-28 NOTE — Telephone Encounter (Signed)
Talked with Dr. [name] and called both numbers and left msg.  Dr. Michela Pitcher could give verbal order to [facility] to change cath and if pt was still frail at appt date of 12/7 he could reschedule.

## 2020-08-29 ENCOUNTER — Other Ambulatory Visit: Payer: Self-pay | Admitting: Neurology

## 2020-08-29 ENCOUNTER — Telehealth: Payer: Self-pay | Admitting: Neurology

## 2020-08-29 DIAGNOSIS — L89152 Pressure ulcer of sacral region, stage 2: Secondary | ICD-10-CM | POA: Diagnosis not present

## 2020-08-29 DIAGNOSIS — G35 Multiple sclerosis: Secondary | ICD-10-CM | POA: Diagnosis not present

## 2020-08-29 DIAGNOSIS — J1282 Pneumonia due to coronavirus disease 2019: Secondary | ICD-10-CM | POA: Diagnosis not present

## 2020-08-29 DIAGNOSIS — U071 COVID-19: Secondary | ICD-10-CM | POA: Diagnosis not present

## 2020-08-29 DIAGNOSIS — G9349 Other encephalopathy: Secondary | ICD-10-CM | POA: Diagnosis not present

## 2020-08-29 DIAGNOSIS — J9601 Acute respiratory failure with hypoxia: Secondary | ICD-10-CM | POA: Diagnosis not present

## 2020-08-29 MED ORDER — DIAZEPAM 5 MG PO TABS: 5.0000 mg | ORAL_TABLET | Freq: Two times a day (BID) | ORAL | 0 refills | Status: DC | PRN

## 2020-08-29 NOTE — Telephone Encounter (Signed)
Rx for Valium refilled for 1 mo with 0 refills.

## 2020-08-29 NOTE — Addendum Note (Signed)
Addended by: Star Age on: 08/29/2020 03:49 PM   Modules accepted: Orders

## 2020-08-29 NOTE — Telephone Encounter (Signed)
Please check on the Valium prescription, in the chart, it says prescriber Dia Crawford.  Prescription from October 2021.  Is he getting the prescription from other prescribers as well?

## 2020-08-29 NOTE — Telephone Encounter (Signed)
Pt is requesting a refill for diazepam (VALIUM) 5 MG tablet.  Pharmacy: [facility]

## 2020-08-30 DIAGNOSIS — L89152 Pressure ulcer of sacral region, stage 2: Secondary | ICD-10-CM | POA: Diagnosis not present

## 2020-08-30 DIAGNOSIS — G9349 Other encephalopathy: Secondary | ICD-10-CM | POA: Diagnosis not present

## 2020-08-30 DIAGNOSIS — U071 COVID-19: Secondary | ICD-10-CM | POA: Diagnosis not present

## 2020-08-30 DIAGNOSIS — R2689 Other abnormalities of gait and mobility: Secondary | ICD-10-CM | POA: Diagnosis not present

## 2020-08-30 DIAGNOSIS — J9601 Acute respiratory failure with hypoxia: Secondary | ICD-10-CM | POA: Diagnosis not present

## 2020-08-30 DIAGNOSIS — T83511A Infection and inflammatory reaction due to indwelling urethral catheter, initial encounter: Secondary | ICD-10-CM | POA: Diagnosis not present

## 2020-08-30 DIAGNOSIS — G35 Multiple sclerosis: Secondary | ICD-10-CM | POA: Diagnosis not present

## 2020-08-30 DIAGNOSIS — J1282 Pneumonia due to coronavirus disease 2019: Secondary | ICD-10-CM | POA: Diagnosis not present

## 2020-08-31 DIAGNOSIS — L89152 Pressure ulcer of sacral region, stage 2: Secondary | ICD-10-CM | POA: Diagnosis not present

## 2020-08-31 DIAGNOSIS — U071 COVID-19: Secondary | ICD-10-CM | POA: Diagnosis not present

## 2020-08-31 DIAGNOSIS — J1282 Pneumonia due to coronavirus disease 2019: Secondary | ICD-10-CM | POA: Diagnosis not present

## 2020-08-31 DIAGNOSIS — J9601 Acute respiratory failure with hypoxia: Secondary | ICD-10-CM | POA: Diagnosis not present

## 2020-08-31 DIAGNOSIS — G35 Multiple sclerosis: Secondary | ICD-10-CM | POA: Diagnosis not present

## 2020-08-31 DIAGNOSIS — G9349 Other encephalopathy: Secondary | ICD-10-CM | POA: Diagnosis not present

## 2020-09-03 DIAGNOSIS — U071 COVID-19: Secondary | ICD-10-CM | POA: Diagnosis not present

## 2020-09-03 DIAGNOSIS — J9601 Acute respiratory failure with hypoxia: Secondary | ICD-10-CM | POA: Diagnosis not present

## 2020-09-03 DIAGNOSIS — G35 Multiple sclerosis: Secondary | ICD-10-CM | POA: Diagnosis not present

## 2020-09-03 DIAGNOSIS — G9349 Other encephalopathy: Secondary | ICD-10-CM | POA: Diagnosis not present

## 2020-09-03 DIAGNOSIS — J1282 Pneumonia due to coronavirus disease 2019: Secondary | ICD-10-CM | POA: Diagnosis not present

## 2020-09-03 DIAGNOSIS — L89152 Pressure ulcer of sacral region, stage 2: Secondary | ICD-10-CM | POA: Diagnosis not present

## 2020-09-04 ENCOUNTER — Ambulatory Visit (INDEPENDENT_AMBULATORY_CARE_PROVIDER_SITE_OTHER): Payer: Medicare Other | Admitting: Urology

## 2020-09-04 ENCOUNTER — Other Ambulatory Visit: Payer: Self-pay

## 2020-09-04 ENCOUNTER — Encounter: Payer: Self-pay | Admitting: Urology

## 2020-09-04 VITALS — BP 163/77 | HR 90 | Temp 99.3°F | Ht 63.0 in | Wt 108.0 lb

## 2020-09-04 DIAGNOSIS — G9349 Other encephalopathy: Secondary | ICD-10-CM | POA: Diagnosis not present

## 2020-09-04 DIAGNOSIS — N201 Calculus of ureter: Secondary | ICD-10-CM

## 2020-09-04 DIAGNOSIS — J1282 Pneumonia due to coronavirus disease 2019: Secondary | ICD-10-CM | POA: Diagnosis not present

## 2020-09-04 DIAGNOSIS — G35 Multiple sclerosis: Secondary | ICD-10-CM | POA: Diagnosis not present

## 2020-09-04 DIAGNOSIS — J9601 Acute respiratory failure with hypoxia: Secondary | ICD-10-CM | POA: Diagnosis not present

## 2020-09-04 DIAGNOSIS — U071 COVID-19: Secondary | ICD-10-CM | POA: Diagnosis not present

## 2020-09-04 DIAGNOSIS — L89152 Pressure ulcer of sacral region, stage 2: Secondary | ICD-10-CM | POA: Diagnosis not present

## 2020-09-04 MED ORDER — GENTAMICIN SULFATE 40 MG/ML IJ SOLN
160.0000 mg | Freq: Once | INTRAMUSCULAR | Status: AC
  Administered 2020-09-04: 160 mg via INTRAMUSCULAR

## 2020-09-04 NOTE — Addendum Note (Signed)
Addended by: Valentina Lucks on: 09/04/2020 04:30 PM   Modules accepted: Orders

## 2020-09-04 NOTE — Progress Notes (Signed)
Urological Symptom Review  Patient is experiencing the following symptoms: None   Review of Systems  Gastrointestinal (upper)  : Negative for upper GI symptoms  Gastrointestinal (lower) : Constipation  Constitutional : Fatigue Weight loss  Skin: Negative for skin symptoms  Eyes: Blurred vision  Ear/Nose/Throat : Negative for Ear/Nose/Throat symptoms  Hematologic/Lymphatic: Negative for Hematologic/Lymphatic symptoms  Cardiovascular : Negative for cardiovascular symptoms  Respiratory : Shortness of breath  Endocrine: Negative for endocrine symptoms  Musculoskeletal: Negative for musculoskeletal symptoms  Neurological: Negative for neurological symptoms  Psychologic: Negative for psychiatric symptoms

## 2020-09-04 NOTE — Progress Notes (Signed)
H&P  Chief Complaint: History of Renal/Ureteral Calculi  History of Present Illness:   12.7.2021: Presents today for cystoscopy and left double-J stent extraction.  He has had Covid recently and was hospitalized for this.  He has been started on Keflex recently.  (below copied from [facility] records):  10.4.2019: Param returns today for cystoscopy and SP tube change. He had a stone removed at his last tube change from the tract and he is to have cystoscopy to look for more stones. He was treated for a UTI after his last tube change.   8.17.2021: Cordaro presents for scheduling of his ureteroscopy.  He had urgent placement of a left percutaneous nephrostomy tube approximately 2 weeks ago.  That was for an obstructing, infected ureteral stone.  He still has percutaneous tube present.  He has had no issues with this.  Pt here for f/u with his bladder stone treatment plan. Pt has recently lost a significant amount of weight following dietary limitation on sodium. He has also had a marked reduction in ambulation. Pt will finish his pre-op antibiotics on Friday (8.20.2021).  9.21.2021Fritz Pickerel underwent left ureteroscopy with stone extraction and stent placement on August 24.  He did well initially but over the past week or so has had left flank pain and had a low-grade fever last night.  Past Medical History:  Diagnosis Date  . Anemia   . Arthritis   . Back pain, chronic   . Bilateral carotid bruits   . C. difficile colitis 09/2011  . CAD (coronary artery disease)   . Carotid artery stenosis   . Cerebrovascular disease   . Cerebrovascular disease 08/17/2018  . Colon polyps   . Complication of cystostomy catheter, initial encounter ([place]) 04/20/2020  . Dyslipidemia   . Dysphagia 10/07/2011  . Encephalopathy   . Gait disorder   . HA (headache)   . High grade dysplasia in colonic adenoma 09/2005  . History of kidney stones   . HTN (hypertension), malignant 10/06/2011  . Hypernatremia   . Hypokalemia    . Hypothyroidism 10/08/2011  . Insomnia   . Junctional rhythm   . Kidney stones   . MS (multiple sclerosis) ([place])   . Neuromuscular disorder ([facility])    MS  . OSA (obstructive sleep apnea)   . Paroxysmal atrial tachycardia ([place])   . Peripheral vascular disease ([place])   . Pneumonia 4 yrs ago  . Pulmonary fibrosis ([place]) 10/06/2011  . Pulmonary nodule 10/08/2011  . PVD (peripheral vascular disease) ([place])   . Sacral ulcer ([place])   . Sleep apnea    cannot tolerate  . Stroke [facility])    left sided weakness  . Suprapubic catheter ([place])   . TIA (transient ischemic attack)   . Tremors of nervous system 10/08/2011  . Urinary tract infection   . Ventral hernia without obstruction or gangrene     Large 8X9cm ventral hernia with loss of domain. CT reads report as diastasis recti with herniation or diastasis recti.  Dr. Constance Haw, Surgery, reviewed CT with radiology and there is herniation with only hernia sac or peritoneum over the bowel and large separation of the rectus muslce (i.e. diastasis recti aka loss of domain).  No surgical intervention recommended given size, age, and health.     Past Surgical History:  Procedure Laterality Date  . APPENDECTOMY  09/2005   at time of left hemicolectomy  . BACK SURGERY  1976/1979   lower  . BILIARY DILATION N/A 03/03/2020   Procedure: BILIARY  DILATION;  Surgeon: Rogene Houston, MD;  [facility];  Service: Endoscopy;  Laterality: N/A;  . CATARACT EXTRACTION W/PHACO Right 03/08/2018   Procedure: CATARACT EXTRACTION PHACO AND INTRAOCULAR LENS PLACEMENT RIGHT EYE;  Surgeon: Tonny Branch, MD;  Location: [facility];  Service: Ophthalmology;  Laterality: Right;  CDE: 8.86  . CATARACT EXTRACTION W/PHACO Left 04/05/2018   Procedure: CATARACT EXTRACTION PHACO AND INTRAOCULAR LENS PLACEMENT (IOC);  Surgeon: Tonny Branch, MD;  Location: [facility];  Service: Ophthalmology;  Laterality: Left;  CDE: 7.36  . CHOLECYSTECTOMY     Dr. Tamala Julian  . COLON SURGERY  09/2005   [place]:  four tubular adenomas, large adenomatous polyp with HIGH GRADE dysplasia  . COLONOSCOPY  11/2004   Dr. Sharol Roussel sessile polyp splenic flexure, 24mm sessile polyp desc colon, tubulovillous adenoma (bx not removed)  . COLONOSCOPY  01/2005   poor prep, polyp could not be found  . COLONOSCOPY  05/2005   with EMR, polypectomy Dr. Olegario Messier, bx showed high grade dysplasia, partially resected  . COLONOSCOPY  09/2005   Dr. Arsenio Loader, [place] ink tattooing, four villous colon polyp (3 had been missed on previous colonoscopies due to limitations of procedures  . COLONOSCOPY  09/2006   normal TI, no polyps  . COLONOSCOPY  10/2007   Dr. Imogene Burn distal mammillations, benign bx, normal TI, random bx neg for microscopic colitis  . CYSTOSCOPY WITH LITHOLAPAXY N/A 07/27/2018   Procedure: CYSTOSCOPY WITH LITHOLAPAXY VIA  SUPRAPUBIC TUBE;  Surgeon: Franchot Gallo, MD;  Location: [facility];  Service: Urology;  Laterality: N/A;  . CYSTOSCOPY WITH RETROGRADE PYELOGRAM, URETEROSCOPY AND STENT PLACEMENT Left 06/09/2017   Procedure: CYSTOSCOPY WITH LEFT RETROGRADE PYELOGRAM, LEFT URETEROSCOPY, LEFT URETEROSCOPIC STONE EXTRACTION, LEFT URETERAL STENT PLACEMENT;  Surgeon: Franchot Gallo, MD;  Location: [facility];  Service: Urology;  Laterality: Left;  . CYSTOSCOPY/URETEROSCOPY/HOLMIUM LASER/STENT PLACEMENT Left 05/22/2020   Procedure: CYSTOSCOPY/URETEROSCOPY/STENT PLACEMENT;  Surgeon: Franchot Gallo, MD;  Location: [facility];  Service: Urology;  Laterality: Left;  . ERCP N/A 03/03/2020   Procedure: ENDOSCOPIC RETROGRADE CHOLANGIOPANCREATOGRAPHY (ERCP);  Surgeon: Rogene Houston, MD;  [facility];  Service: Endoscopy;  Laterality: N/A;  . ERCP N/A 04/20/2020   Procedure: ENDOSCOPIC RETROGRADE CHOLANGIOPANCREATOGRAPHY (ERCP);  Surgeon: Rogene Houston, MD;  [facility];  Service: Endoscopy;  Laterality: N/A;  to be done at 7:30am in OR  . GASTROINTESTINAL STENT REMOVAL N/A 04/20/2020    Procedure: STENT REMOVAL;  Surgeon: Rogene Houston, MD;  [facility];  Service: Endoscopy;  Laterality: N/A;  . INGUINAL HERNIA REPAIR  1971   bilateral  . INSERTION OF SUPRAPUBIC CATHETER  06/09/2017   Procedure: EXCHANGE OF SUPRAPUBIC CATHETER;  Surgeon: Franchot Gallo, MD;  Location: [facility];  Service: Urology;;  . INSERTION OF SUPRAPUBIC CATHETER  05/22/2020   Procedure: SUPRAPUBIC CATHETER EXCHANGE;  Surgeon: Franchot Gallo, MD;  Location: [facility];  Service: Urology;;  . IR NEPHROSTOMY PLACEMENT LEFT  05/26/2017  . IR NEPHROSTOMY PLACEMENT LEFT  05/02/2020  . KIDNEY STONE SURGERY  09/13/2015  . LITHOTRIPSY N/A 03/03/2020   Procedure: MECHANICAL LITHOTRIPSY WITH REMOVAL OF MULTIPLE STONE FRAGMENTS;  Surgeon: Rogene Houston, MD;  [facility];  Service: Endoscopy;  Laterality: N/A;  . NEPHROLITHOTOMY Left 09/13/2015   Procedure: LEFT PERCUTANEOUS NEPHROLITHOTOMY ;  Surgeon: Franchot Gallo, MD;  Location: [facility];  Service: Urology;  Laterality: Left;  . NEPHROSTOMY TUBE REMOVAL  05/22/2020   Procedure: NEPHROSTOMY TUBE REMOVAL;  Surgeon: Franchot Gallo,  MD;  Location: [facility];  Service: Urology;;  . REMOVAL OF STONES N/A 04/20/2020   Procedure: REMOVAL OF STONES;  Surgeon: Rogene Houston, MD;  [facility];  Service: Endoscopy;  Laterality: N/A;  . SPHINCTEROTOMY  03/03/2020   Procedure: BILLARY SPHINCTEROTOMY;  Surgeon: Rogene Houston, MD;  [facility];  Service: Endoscopy;;  . SUPRAPUBIC CATHETER INSERTION      Home Medications:  Allergies as of 09/04/2020      Reactions   Tetracyclines & Related Anaphylaxis, Rash   Ciprofloxacin    Trouble swallowing unknown reaction according to wife       Medication List       Accurate as of September 04, 2020 11:35 AM. If you have any questions, ask your nurse or doctor.        acetaminophen 325 MG tablet Commonly known as: TYLENOL Take 2 tablets (650 mg total) by mouth every 6 (six)  hours as needed for mild pain or headache (fever >/= 101).   ascorbic acid 500 MG tablet Commonly known as: VITAMIN C Take 1 tablet (500 mg total) by mouth 2 (two) times daily.   cephALEXin 250 MG capsule Commonly known as: KEFLEX Take 1 capsule (250 mg total) by mouth at bedtime.   diazepam 5 MG tablet Commonly known as: VALIUM Take 1 tablet (5 mg total) by mouth every 12 (twelve) hours as needed for muscle spasms.   guaiFENesin-dextromethorphan 100-10 MG/5ML syrup Commonly known as: ROBITUSSIN DM Take 10 mLs by mouth every 4 (four) hours as needed for cough.   HYDROcodone-acetaminophen 7.5-325 MG tablet Commonly known as: NORCO Take 1 tablet by mouth 2 (two) times daily. Max APAP 3 GM IN 24 HOURS FROM ALL SOURCES What changed:   when to take this  additional instructions   Ipratropium-Albuterol 20-100 MCG/ACT Aers respimat Commonly known as: COMBIVENT Inhale 1 puff into the lungs every 6 (six) hours as needed for wheezing.   levothyroxine 75 MCG tablet Commonly known as: SYNTHROID Take 75 mcg by mouth daily before breakfast. ALL MEDICATIONS TO BE CRUSHED AND PLACED IN APPLESAUCE   linagliptin 5 MG Tabs tablet Commonly known as: TRADJENTA Take 1 tablet (5 mg total) by mouth daily.   multivitamin with minerals Tabs tablet Take 1 tablet by mouth daily.   Resource ThickenUp Clear Powd Use as directed       Allergies:  Allergies  Allergen Reactions  . Tetracyclines & Related Anaphylaxis and Rash  . Ciprofloxacin     Trouble swallowing unknown reaction according to wife     Family History  Problem Relation Age of Onset  . Cirrhosis Brother        etoh  . Stroke Mother 21  . Coronary artery disease Father 72  . Heart attack Brother   . Cancer Sister   . Multiple sclerosis Other   . Colon cancer Neg Hx     Social History:  reports that he quit smoking about 32 years ago. His smoking use included cigarettes. He has a 25.00 pack-year smoking history. He has  never used smokeless tobacco. He reports that he does not drink alcohol and does not use drugs.  ROS: A complete review of systems was performed.  All systems are negative except for pertinent findings as noted.  Physical Exam:  Vital signs in last 24 hours: There were no vitals taken for this visit. Constitutional:  Alert and oriented, No acute distress Cardiovascular: Regular rate  Respiratory: Normal respiratory effort Neurologic:  Grossly intact, no focal deficits Psychiatric: Normal mood and affect  I have reviewed prior pt notes  I have reviewed notes from referring/previous physicians  I have reviewed urinalysis results  I have independently reviewed prior imaging  I have reviewed prior urine culture  Cystoscopy Procedure Note:  Indication: Stent extraction following ureteroscopy and stone management  After informed consent and discussion of the procedure and its risks, TAKUMA CIFELLI was positioned and prepped in the standard fashion.  Cystoscopy was performed with a flexible cystoscope.  This was performed through his suprapubic site. Findings: Urothelium of the bladder appeared normal.  Bladder neck appeared normal.  Stent was present at the left ureteral orifice.  It was grasped and removed without difficulty.  Following this, 79 French Foley catheter was placed as suprapubic tube.  10 cc of water placed in the balloon.  Bedside bag connected.  The patient tolerated the procedure well.  He was given 160 mg of gentamicin IM following the procedure.        Impression/Assessment:  1.  Urinary retention with suprapubic tube in place, changed today  2.  History of urolithiasis, status post ureteroscopic management of a left ureteral stone 3 to 4 months ago with stent removed today  Plan:  1.  He was covered with gentamicin, he will continue Keflex  2.  I will have him come back in 1 month with renal ultrasound and for catheter change.

## 2020-09-05 ENCOUNTER — Encounter (HOSPITAL_COMMUNITY): Payer: Self-pay | Admitting: Emergency Medicine

## 2020-09-05 ENCOUNTER — Inpatient Hospital Stay (HOSPITAL_COMMUNITY): Payer: Medicare Other | Admitting: Anesthesiology

## 2020-09-05 ENCOUNTER — Inpatient Hospital Stay (HOSPITAL_COMMUNITY)
Admission: EM | Admit: 2020-09-05 | Discharge: 2020-09-11 | DRG: 854 | Disposition: A | Discharge status: Home Health | Payer: Medicare Other | Attending: Internal Medicine | Admitting: Internal Medicine

## 2020-09-05 ENCOUNTER — Other Ambulatory Visit: Payer: Self-pay

## 2020-09-05 ENCOUNTER — Encounter (HOSPITAL_COMMUNITY)
Admission: EM | Disposition: A | Discharge status: Home Health | Payer: Self-pay | Source: Home / Self Care | Attending: Internal Medicine

## 2020-09-05 ENCOUNTER — Emergency Department (HOSPITAL_COMMUNITY): Payer: Medicare Other

## 2020-09-05 ENCOUNTER — Inpatient Hospital Stay (HOSPITAL_COMMUNITY): Payer: Medicare Other

## 2020-09-05 DIAGNOSIS — J8410 Pulmonary fibrosis, unspecified: Secondary | ICD-10-CM | POA: Diagnosis present

## 2020-09-05 DIAGNOSIS — G4730 Sleep apnea, unspecified: Secondary | ICD-10-CM | POA: Diagnosis not present

## 2020-09-05 DIAGNOSIS — A419 Sepsis, unspecified organism: Secondary | ICD-10-CM | POA: Diagnosis not present

## 2020-09-05 DIAGNOSIS — N136 Pyonephrosis: Secondary | ICD-10-CM | POA: Diagnosis present

## 2020-09-05 DIAGNOSIS — I5032 Chronic diastolic (congestive) heart failure: Secondary | ICD-10-CM | POA: Diagnosis present

## 2020-09-05 DIAGNOSIS — N319 Neuromuscular dysfunction of bladder, unspecified: Secondary | ICD-10-CM | POA: Diagnosis present

## 2020-09-05 DIAGNOSIS — Z466 Encounter for fitting and adjustment of urinary device: Secondary | ICD-10-CM | POA: Diagnosis not present

## 2020-09-05 DIAGNOSIS — E876 Hypokalemia: Secondary | ICD-10-CM

## 2020-09-05 DIAGNOSIS — Z20822 Contact with and (suspected) exposure to covid-19: Secondary | ICD-10-CM | POA: Diagnosis not present

## 2020-09-05 DIAGNOSIS — I872 Venous insufficiency (chronic) (peripheral): Secondary | ICD-10-CM | POA: Diagnosis not present

## 2020-09-05 DIAGNOSIS — R7881 Bacteremia: Secondary | ICD-10-CM | POA: Diagnosis not present

## 2020-09-05 DIAGNOSIS — J9611 Chronic respiratory failure with hypoxia: Secondary | ICD-10-CM | POA: Diagnosis present

## 2020-09-05 DIAGNOSIS — N179 Acute kidney failure, unspecified: Secondary | ICD-10-CM | POA: Diagnosis not present

## 2020-09-05 DIAGNOSIS — R1084 Generalized abdominal pain: Secondary | ICD-10-CM | POA: Diagnosis not present

## 2020-09-05 DIAGNOSIS — Z823 Family history of stroke: Secondary | ICD-10-CM

## 2020-09-05 DIAGNOSIS — N139 Obstructive and reflux uropathy, unspecified: Secondary | ICD-10-CM | POA: Diagnosis not present

## 2020-09-05 DIAGNOSIS — I130 Hypertensive heart and chronic kidney disease with heart failure and stage 1 through stage 4 chronic kidney disease, or unspecified chronic kidney disease: Secondary | ICD-10-CM | POA: Diagnosis present

## 2020-09-05 DIAGNOSIS — Z7984 Long term (current) use of oral hypoglycemic drugs: Secondary | ICD-10-CM

## 2020-09-05 DIAGNOSIS — Z955 Presence of coronary angioplasty implant and graft: Secondary | ICD-10-CM

## 2020-09-05 DIAGNOSIS — E1151 Type 2 diabetes mellitus with diabetic peripheral angiopathy without gangrene: Secondary | ICD-10-CM | POA: Diagnosis present

## 2020-09-05 DIAGNOSIS — Z9981 Dependence on supplemental oxygen: Secondary | ICD-10-CM

## 2020-09-05 DIAGNOSIS — I679 Cerebrovascular disease, unspecified: Secondary | ICD-10-CM | POA: Diagnosis present

## 2020-09-05 DIAGNOSIS — N390 Urinary tract infection, site not specified: Secondary | ICD-10-CM

## 2020-09-05 DIAGNOSIS — N189 Chronic kidney disease, unspecified: Secondary | ICD-10-CM

## 2020-09-05 DIAGNOSIS — A4150 Gram-negative sepsis, unspecified: Principal | ICD-10-CM | POA: Diagnosis present

## 2020-09-05 DIAGNOSIS — R6520 Severe sepsis without septic shock: Secondary | ICD-10-CM | POA: Diagnosis present

## 2020-09-05 DIAGNOSIS — G4733 Obstructive sleep apnea (adult) (pediatric): Secondary | ICD-10-CM | POA: Diagnosis present

## 2020-09-05 DIAGNOSIS — N201 Calculus of ureter: Secondary | ICD-10-CM

## 2020-09-05 DIAGNOSIS — Z452 Encounter for adjustment and management of vascular access device: Secondary | ICD-10-CM

## 2020-09-05 DIAGNOSIS — Z8249 Family history of ischemic heart disease and other diseases of the circulatory system: Secondary | ICD-10-CM

## 2020-09-05 DIAGNOSIS — R0902 Hypoxemia: Secondary | ICD-10-CM | POA: Diagnosis not present

## 2020-09-05 DIAGNOSIS — I480 Paroxysmal atrial fibrillation: Secondary | ICD-10-CM | POA: Diagnosis present

## 2020-09-05 DIAGNOSIS — I10 Essential (primary) hypertension: Secondary | ICD-10-CM | POA: Diagnosis not present

## 2020-09-05 DIAGNOSIS — I361 Nonrheumatic tricuspid (valve) insufficiency: Secondary | ICD-10-CM | POA: Diagnosis not present

## 2020-09-05 DIAGNOSIS — E1122 Type 2 diabetes mellitus with diabetic chronic kidney disease: Secondary | ICD-10-CM | POA: Diagnosis present

## 2020-09-05 DIAGNOSIS — Z87891 Personal history of nicotine dependence: Secondary | ICD-10-CM

## 2020-09-05 DIAGNOSIS — N138 Other obstructive and reflux uropathy: Secondary | ICD-10-CM | POA: Diagnosis not present

## 2020-09-05 DIAGNOSIS — Z79899 Other long term (current) drug therapy: Secondary | ICD-10-CM

## 2020-09-05 DIAGNOSIS — I2510 Atherosclerotic heart disease of native coronary artery without angina pectoris: Secondary | ICD-10-CM | POA: Diagnosis present

## 2020-09-05 DIAGNOSIS — N183 Chronic kidney disease, stage 3 unspecified: Secondary | ICD-10-CM | POA: Diagnosis not present

## 2020-09-05 DIAGNOSIS — I420 Dilated cardiomyopathy: Secondary | ICD-10-CM | POA: Diagnosis not present

## 2020-09-05 DIAGNOSIS — N261 Atrophy of kidney (terminal): Secondary | ICD-10-CM | POA: Diagnosis not present

## 2020-09-05 DIAGNOSIS — Z820 Family history of epilepsy and other diseases of the nervous system: Secondary | ICD-10-CM

## 2020-09-05 DIAGNOSIS — R54 Age-related physical debility: Secondary | ICD-10-CM | POA: Diagnosis present

## 2020-09-05 DIAGNOSIS — G35 Multiple sclerosis: Secondary | ICD-10-CM | POA: Diagnosis present

## 2020-09-05 DIAGNOSIS — E860 Dehydration: Secondary | ICD-10-CM | POA: Diagnosis present

## 2020-09-05 DIAGNOSIS — N184 Chronic kidney disease, stage 4 (severe): Secondary | ICD-10-CM | POA: Diagnosis not present

## 2020-09-05 DIAGNOSIS — Z79890 Hormone replacement therapy (postmenopausal): Secondary | ICD-10-CM

## 2020-09-05 DIAGNOSIS — Z809 Family history of malignant neoplasm, unspecified: Secondary | ICD-10-CM

## 2020-09-05 DIAGNOSIS — E039 Hypothyroidism, unspecified: Secondary | ICD-10-CM | POA: Diagnosis not present

## 2020-09-05 DIAGNOSIS — J449 Chronic obstructive pulmonary disease, unspecified: Secondary | ICD-10-CM | POA: Diagnosis present

## 2020-09-05 DIAGNOSIS — E1165 Type 2 diabetes mellitus with hyperglycemia: Secondary | ICD-10-CM | POA: Diagnosis not present

## 2020-09-05 DIAGNOSIS — Z881 Allergy status to other antibiotic agents status: Secondary | ICD-10-CM

## 2020-09-05 DIAGNOSIS — D631 Anemia in chronic kidney disease: Secondary | ICD-10-CM | POA: Diagnosis present

## 2020-09-05 DIAGNOSIS — N132 Hydronephrosis with renal and ureteral calculous obstruction: Secondary | ICD-10-CM | POA: Diagnosis not present

## 2020-09-05 DIAGNOSIS — Z8673 Personal history of transient ischemic attack (TIA), and cerebral infarction without residual deficits: Secondary | ICD-10-CM

## 2020-09-05 DIAGNOSIS — T380X5A Adverse effect of glucocorticoids and synthetic analogues, initial encounter: Secondary | ICD-10-CM | POA: Diagnosis not present

## 2020-09-05 DIAGNOSIS — I509 Heart failure, unspecified: Secondary | ICD-10-CM | POA: Diagnosis not present

## 2020-09-05 HISTORY — PX: CYSTOSCOPY W/ URETERAL STENT PLACEMENT: SHX1429

## 2020-09-05 LAB — CBC WITH DIFFERENTIAL/PLATELET
Abs Immature Granulocytes: 0.1 10*3/uL — ABNORMAL HIGH (ref 0.00–0.07)
Basophils Absolute: 0.1 10*3/uL (ref 0.0–0.1)
Basophils Relative: 0 %
Eosinophils Absolute: 0.1 10*3/uL (ref 0.0–0.5)
Eosinophils Relative: 0 %
HCT: 30.9 % — ABNORMAL LOW (ref 39.0–52.0)
Hemoglobin: 9.6 g/dL — ABNORMAL LOW (ref 13.0–17.0)
Immature Granulocytes: 1 %
Lymphocytes Relative: 9 %
Lymphs Abs: 1.7 10*3/uL (ref 0.7–4.0)
MCH: 30 pg (ref 26.0–34.0)
MCHC: 31.1 g/dL (ref 30.0–36.0)
MCV: 96.6 fL (ref 80.0–100.0)
Monocytes Absolute: 0.8 10*3/uL (ref 0.1–1.0)
Monocytes Relative: 5 %
Neutro Abs: 15.7 10*3/uL — ABNORMAL HIGH (ref 1.7–7.7)
Neutrophils Relative %: 85 %
Platelets: 296 10*3/uL (ref 150–400)
RBC: 3.2 MIL/uL — ABNORMAL LOW (ref 4.22–5.81)
RDW: 15 % (ref 11.5–15.5)
WBC: 18.4 10*3/uL — ABNORMAL HIGH (ref 4.0–10.5)
nRBC: 0 % (ref 0.0–0.2)

## 2020-09-05 LAB — URINALYSIS, ROUTINE W REFLEX MICROSCOPIC
Bilirubin Urine: NEGATIVE
Glucose, UA: NEGATIVE mg/dL
Ketones, ur: NEGATIVE mg/dL
Nitrite: NEGATIVE
Protein, ur: 30 mg/dL — AB
RBC / HPF: >50 RBC/hpf — ABNORMAL HIGH (ref 0–5)
Specific Gravity, Urine: 1.004 — ABNORMAL LOW (ref 1.005–1.030)
WBC, UA: >50 WBC/hpf — ABNORMAL HIGH (ref 0–5)
pH: 7 (ref 5.0–8.0)

## 2020-09-05 LAB — BASIC METABOLIC PANEL
Anion gap: 11 (ref 5–15)
Anion gap: 11 (ref 5–15)
BUN: 39 mg/dL — ABNORMAL HIGH (ref 8–23)
BUN: 40 mg/dL — ABNORMAL HIGH (ref 8–23)
CO2: 25 mmol/L (ref 22–32)
CO2: 24 mmol/L (ref 22–32)
Calcium: 9.2 mg/dL (ref 8.9–10.3)
Calcium: 8.5 mg/dL — ABNORMAL LOW (ref 8.9–10.3)
Chloride: 98 mmol/L (ref 98–111)
Chloride: 100 mmol/L (ref 98–111)
Creatinine, Ser: 3.39 mg/dL — ABNORMAL HIGH (ref 0.61–1.24)
Creatinine, Ser: 3.54 mg/dL — ABNORMAL HIGH (ref 0.61–1.24)
GFR, Estimated: 18 mL/min — ABNORMAL LOW (ref >60)
GFR, Estimated: 17 mL/min — ABNORMAL LOW (ref >60)
Glucose, Bld: 144 mg/dL — ABNORMAL HIGH (ref 70–99)
Glucose, Bld: 154 mg/dL — ABNORMAL HIGH (ref 70–99)
Potassium: 2.7 mmol/L — CL (ref 3.5–5.1)
Potassium: 3.3 mmol/L — ABNORMAL LOW (ref 3.5–5.1)
Sodium: 134 mmol/L — ABNORMAL LOW (ref 135–145)
Sodium: 135 mmol/L (ref 135–145)

## 2020-09-05 LAB — MAGNESIUM: Magnesium: 1.6 mg/dL — ABNORMAL LOW (ref 1.7–2.4)

## 2020-09-05 LAB — CBC
HCT: 28.3 % — ABNORMAL LOW (ref 39.0–52.0)
Hemoglobin: 8.8 g/dL — ABNORMAL LOW (ref 13.0–17.0)
MCH: 30.2 pg (ref 26.0–34.0)
MCHC: 31.1 g/dL (ref 30.0–36.0)
MCV: 97.3 fL (ref 80.0–100.0)
Platelets: 284 10*3/uL (ref 150–400)
RBC: 2.91 MIL/uL — ABNORMAL LOW (ref 4.22–5.81)
RDW: 15.1 % (ref 11.5–15.5)
WBC: 20.1 10*3/uL — ABNORMAL HIGH (ref 4.0–10.5)
nRBC: 0 % (ref 0.0–0.2)

## 2020-09-05 LAB — RESP PANEL BY RT-PCR (FLU A&B, COVID) ARPGX2
Influenza A by PCR: NEGATIVE
Influenza B by PCR: NEGATIVE
SARS Coronavirus 2 by RT PCR: NEGATIVE

## 2020-09-05 LAB — LACTIC ACID, PLASMA
Lactic Acid, Venous: 1.4 mmol/L (ref 0.5–1.9)
Lactic Acid, Venous: 1.7 mmol/L (ref 0.5–1.9)

## 2020-09-05 LAB — CREATININE, SERUM
Creatinine, Ser: 3.67 mg/dL — ABNORMAL HIGH (ref 0.61–1.24)
GFR, Estimated: 16 mL/min — ABNORMAL LOW (ref >60)

## 2020-09-05 SURGERY — CYSTOSCOPY, WITH RETROGRADE PYELOGRAM AND URETERAL STENT INSERTION
Anesthesia: General | Laterality: Left

## 2020-09-05 MED ORDER — ACETAMINOPHEN 650 MG RE SUPP: 650.0000 mg | Freq: Four times a day (QID) | RECTAL | Status: DC | PRN

## 2020-09-05 MED ORDER — LEVOTHYROXINE SODIUM 75 MCG PO TABS
75.0000 ug | ORAL_TABLET | Freq: Every day | ORAL | Status: DC
  Administered 2020-09-06 – 2020-09-11 (×6): 75 ug via ORAL
  Filled 2020-09-05 (×8): qty 1

## 2020-09-05 MED ORDER — ASCORBIC ACID 500 MG PO TABS
500.0000 mg | ORAL_TABLET | Freq: Two times a day (BID) | ORAL | Status: DC
  Administered 2020-09-05 – 2020-09-11 (×12): 500 mg via ORAL
  Filled 2020-09-05 (×16): qty 1

## 2020-09-05 MED ORDER — WATER FOR IRRIGATION, STERILE IR SOLN
Status: DC | PRN
  Administered 2020-09-05: 1000 mL

## 2020-09-05 MED ORDER — SODIUM CHLORIDE 0.9 % IV BOLUS
1000.0000 mL | Freq: Once | INTRAVENOUS | Status: AC
  Administered 2020-09-05: 1000 mL via INTRAVENOUS

## 2020-09-05 MED ORDER — PHENYLEPHRINE HCL (PRESSORS) 10 MG/ML IV SOLN
INTRAVENOUS | Status: DC | PRN
  Administered 2020-09-05 (×2): 80 ug via INTRAVENOUS

## 2020-09-05 MED ORDER — IPRATROPIUM-ALBUTEROL 0.5-2.5 (3) MG/3ML IN SOLN: 3.0000 mL | Freq: Four times a day (QID) | RESPIRATORY_TRACT | Status: DC | PRN

## 2020-09-05 MED ORDER — ONDANSETRON HCL 4 MG/2ML IJ SOLN: 4.0000 mg | Freq: Once | INTRAMUSCULAR | Status: DC | PRN

## 2020-09-05 MED ORDER — HEPARIN SODIUM (PORCINE) 5000 UNIT/ML IJ SOLN
5000.0000 [IU] | Freq: Three times a day (TID) | INTRAMUSCULAR | Status: DC
  Administered 2020-09-05 – 2020-09-11 (×18): 5000 [IU] via SUBCUTANEOUS
  Filled 2020-09-05 (×18): qty 1

## 2020-09-05 MED ORDER — PROPOFOL 10 MG/ML IV BOLUS
INTRAVENOUS | Status: AC
  Filled 2020-09-05: qty 40

## 2020-09-05 MED ORDER — ADULT MULTIVITAMIN W/MINERALS CH
1.0000 | ORAL_TABLET | Freq: Every day | ORAL | Status: DC
  Administered 2020-09-05 – 2020-09-11 (×7): 1 via ORAL
  Filled 2020-09-05 (×10): qty 1

## 2020-09-05 MED ORDER — POTASSIUM CHLORIDE 10 MEQ/100ML IV SOLN
10.0000 meq | INTRAVENOUS | Status: AC
  Administered 2020-09-05 (×3): 10 meq via INTRAVENOUS
  Filled 2020-09-05 (×3): qty 100

## 2020-09-05 MED ORDER — EPHEDRINE 5 MG/ML INJ
INTRAVENOUS | Status: AC
  Filled 2020-09-05: qty 10

## 2020-09-05 MED ORDER — GUAIFENESIN-DM 100-10 MG/5ML PO SYRP
10.0000 mL | ORAL_SOLUTION | ORAL | Status: DC | PRN
  Filled 2020-09-05: qty 10

## 2020-09-05 MED ORDER — LACTATED RINGERS IV SOLN: INTRAVENOUS | Status: DC | PRN

## 2020-09-05 MED ORDER — LINAGLIPTIN 5 MG PO TABS
5.0000 mg | ORAL_TABLET | Freq: Every day | ORAL | Status: DC
  Administered 2020-09-05 – 2020-09-10 (×6): 5 mg via ORAL
  Filled 2020-09-05 (×9): qty 1

## 2020-09-05 MED ORDER — ACETAMINOPHEN 10 MG/ML IV SOLN
1000.0000 mg | Freq: Once | INTRAVENOUS | Status: AC
  Administered 2020-09-05: 1000 mg via INTRAVENOUS

## 2020-09-05 MED ORDER — LACTATED RINGERS IV SOLN: Freq: Once | INTRAVENOUS | Status: DC

## 2020-09-05 MED ORDER — MORPHINE SULFATE (PF) 4 MG/ML IV SOLN
4.0000 mg | Freq: Once | INTRAVENOUS | Status: AC
  Administered 2020-09-05: 4 mg via INTRAVENOUS
  Filled 2020-09-05: qty 1

## 2020-09-05 MED ORDER — DIAZEPAM 5 MG PO TABS: 5.0000 mg | ORAL_TABLET | Freq: Two times a day (BID) | ORAL | Status: DC | PRN

## 2020-09-05 MED ORDER — ORAL CARE MOUTH RINSE: 15.0000 mL | Freq: Once | OROMUCOSAL | Status: DC

## 2020-09-05 MED ORDER — LIDOCAINE HCL (PF) 2 % IJ SOLN
INTRAMUSCULAR | Status: AC
  Filled 2020-09-05: qty 5

## 2020-09-05 MED ORDER — CEPHALEXIN 250 MG PO CAPS: 250.0000 mg | ORAL_CAPSULE | Freq: Every day | ORAL | Status: DC

## 2020-09-05 MED ORDER — FENTANYL CITRATE (PF) 100 MCG/2ML IJ SOLN
INTRAMUSCULAR | Status: DC | PRN
  Administered 2020-09-05 (×3): 25 ug via INTRAVENOUS

## 2020-09-05 MED ORDER — ACETAMINOPHEN 10 MG/ML IV SOLN
INTRAVENOUS | Status: AC
  Filled 2020-09-05: qty 100

## 2020-09-05 MED ORDER — HYDROCODONE-ACETAMINOPHEN 7.5-325 MG PO TABS
1.0000 | ORAL_TABLET | Freq: Two times a day (BID) | ORAL | Status: DC
  Administered 2020-09-05 – 2020-09-11 (×12): 1 via ORAL
  Filled 2020-09-05 (×12): qty 1

## 2020-09-05 MED ORDER — ONDANSETRON HCL 4 MG/2ML IJ SOLN
4.0000 mg | Freq: Once | INTRAMUSCULAR | Status: AC
  Administered 2020-09-05: 4 mg via INTRAVENOUS
  Filled 2020-09-05: qty 2

## 2020-09-05 MED ORDER — FENTANYL CITRATE (PF) 100 MCG/2ML IJ SOLN
INTRAMUSCULAR | Status: AC
  Filled 2020-09-05: qty 4

## 2020-09-05 MED ORDER — MORPHINE SULFATE (PF) 4 MG/ML IV SOLN: 4.0000 mg | Freq: Once | INTRAVENOUS | Status: DC

## 2020-09-05 MED ORDER — ACETAMINOPHEN 325 MG PO TABS
650.0000 mg | ORAL_TABLET | Freq: Four times a day (QID) | ORAL | Status: DC | PRN
  Administered 2020-09-05: 650 mg via ORAL
  Filled 2020-09-05 (×2): qty 2

## 2020-09-05 MED ORDER — HYDROCODONE-ACETAMINOPHEN 5-325 MG PO TABS: 1.0000 | ORAL_TABLET | Freq: Four times a day (QID) | ORAL | Status: DC | PRN

## 2020-09-05 MED ORDER — SODIUM CHLORIDE 0.9 % IV SOLN
1.0000 g | Freq: Once | INTRAVENOUS | Status: AC
  Administered 2020-09-05: 1 g via INTRAVENOUS
  Filled 2020-09-05: qty 10

## 2020-09-05 MED ORDER — SODIUM CHLORIDE 0.9 % IR SOLN
Status: DC | PRN
  Administered 2020-09-05: 3000 mL

## 2020-09-05 MED ORDER — SODIUM CHLORIDE 0.9 % IV SOLN: INTRAVENOUS | Status: AC

## 2020-09-05 MED ORDER — CHLORHEXIDINE GLUCONATE 0.12 % MT SOLN
15.0000 mL | Freq: Once | OROMUCOSAL | Status: DC
  Filled 2020-09-05: qty 15

## 2020-09-05 MED ORDER — PHENYLEPHRINE 40 MCG/ML (10ML) SYRINGE FOR IV PUSH (FOR BLOOD PRESSURE SUPPORT)
PREFILLED_SYRINGE | INTRAVENOUS | Status: AC
  Filled 2020-09-05: qty 10

## 2020-09-05 MED ORDER — DEXAMETHASONE SODIUM PHOSPHATE 10 MG/ML IJ SOLN
INTRAMUSCULAR | Status: AC
  Filled 2020-09-05: qty 1

## 2020-09-05 MED ORDER — SODIUM CHLORIDE 0.9 % IV SOLN
1.0000 g | INTRAVENOUS | Status: DC
  Administered 2020-09-06: 1 g via INTRAVENOUS
  Filled 2020-09-05: qty 10

## 2020-09-05 MED ORDER — HYDROMORPHONE HCL 1 MG/ML IJ SOLN
0.2500 mg | INTRAMUSCULAR | Status: DC | PRN
  Administered 2020-09-05 (×2): 0.25 mg via INTRAVENOUS
  Filled 2020-09-05: qty 0.5

## 2020-09-05 MED ORDER — ACETAMINOPHEN 325 MG PO TABS: 650.0000 mg | ORAL_TABLET | Freq: Four times a day (QID) | ORAL | Status: DC | PRN

## 2020-09-05 MED ORDER — MIDAZOLAM HCL 2 MG/2ML IJ SOLN
INTRAMUSCULAR | Status: AC
  Filled 2020-09-05: qty 2

## 2020-09-05 MED ORDER — PROPOFOL 10 MG/ML IV BOLUS
INTRAVENOUS | Status: DC | PRN
  Administered 2020-09-05: 50 mg via INTRAVENOUS

## 2020-09-05 MED ORDER — DIATRIZOATE MEGLUMINE 30 % UR SOLN
URETHRAL | Status: AC
  Filled 2020-09-05: qty 100

## 2020-09-05 MED ORDER — RESOURCE THICKENUP CLEAR PO POWD
ORAL | Status: DC | PRN
  Filled 2020-09-05: qty 125

## 2020-09-05 MED ORDER — DIATRIZOATE MEGLUMINE 30 % UR SOLN
URETHRAL | Status: DC | PRN
  Administered 2020-09-05: 10 mL via URETHRAL

## 2020-09-05 SURGICAL SUPPLY — 20 items
BAG DRAIN URO TABLE W/ADPT NS (BAG) ×3 IMPLANT
BAG DRN 8 ADPR NS SKTRN CSTL (BAG) ×1
CATH URET 5FR 28IN OPEN ENDED (CATHETERS) ×3 IMPLANT
CLOTH BEACON ORANGE TIMEOUT ST (SAFETY) ×3 IMPLANT
DECANTER SPIKE VIAL GLASS SM (MISCELLANEOUS) ×3 IMPLANT
DRSG TEGADERM 2-3/8X2-3/4 SM (GAUZE/BANDAGES/DRESSINGS) IMPLANT
GLOVE BIO SURGEON STRL SZ7.5 (GLOVE) ×3 IMPLANT
GLOVE BIOGEL M 6.5 STRL (GLOVE) ×2 IMPLANT
GLOVE BIOGEL PI IND STRL 6.5 (GLOVE) IMPLANT
GLOVE BIOGEL PI INDICATOR 6.5 (GLOVE) ×2
GLOVE ECLIPSE 7.0 STRL STRAW (GLOVE) ×2 IMPLANT
GOWN STRL REIN XL XLG (GOWN DISPOSABLE) ×3 IMPLANT
GOWN STRL REUS W/TWL LRG LVL3 (GOWN DISPOSABLE) ×3 IMPLANT
GUIDEWIRE STR DUAL SENSOR (WIRE) ×3 IMPLANT
IV NS IRRIG 3000ML ARTHROMATIC (IV SOLUTION) ×6 IMPLANT
KIT TURNOVER CYSTO (KITS) ×3 IMPLANT
PACK CYSTO (CUSTOM PROCEDURE TRAY) ×3 IMPLANT
PAD ARMBOARD 7.5X6 YLW CONV (MISCELLANEOUS) ×3 IMPLANT
STENT CONTOUR 6FRX24X.038 (STENTS) ×2 IMPLANT
WATER STERILE IRR 1000ML UROMA (IV SOLUTION) ×3 IMPLANT

## 2020-09-05 NOTE — H&P (Signed)
I have been asked to see the patient by Dr. Thayer Jew, for evaluation and management of left hydronephrosis and renal failure.  History of present illness: 79 year old male with history of a neurogenic bladder and atrophic right kidney who in August underwent left ureteroscopy and laser lithotripsy for obstructing stone.  Prior to that, he had a nephrostomy tube placed because of urosepsis.  His stent removal was canceled because of Covid pneumonia.  Finally he was able to follow-up yesterday for stent removal and a Foley catheter change.  This was uncomplicated.  The patient was on Keflex and was given extra dose of gentamicin.  Later in the day the patient developed acute onset left flank pain.  He was brought to the emergency room a CT scan demonstrated a new obstructing stone in the mid ureter, 1 cm in size.  His creatinine was noted to be 3.5 from a baseline of 1.5 and he was having significant pain.  Urology was consulted for further evaluation.  Fortunately the patient's been afebrile.  His white count is stable.  Review of systems: A 12 point comprehensive review of systems was obtained and is negative unless otherwise stated in the history of present illness.  Patient Active Problem List   Diagnosis Date Noted  . Acute respiratory failure with hypoxia ([place]) 07/12/2020  . Pneumonia due to COVID-19 virus 07/12/2020  . Encephalopathy due to COVID-19 virus 07/12/2020  . Acute hypoxemic respiratory failure due to COVID-19 ([place]) 07/03/2020  . Calculus of ureter 05/15/2020  . Bacteremia due to Gram-negative bacteria 05/04/2020  . Acute lower UTI 05/04/2020  . Acute unilateral obstructive uropathy   . SIRS (systemic inflammatory response syndrome) ([place]) 05/03/2020  . Hydronephrosis 05/02/2020  . Complication of cystostomy catheter, subsequent encounter 04/20/2020  . Acute kidney injury superimposed on CKD ([place]) 03/02/2020  . Dehydration 03/02/2020  . Nausea and vomiting 03/02/2020  .  Ventral hernia without obstruction or gangrene 03/01/2020  . Hypotension 03/01/2020  . Poor appetite 03/01/2020  . Urinary tract infection 03/01/2020  . Detrusor areflexia 09/16/2019  . Cerebrovascular disease 08/17/2018  . Partial small bowel obstruction ([place]) 07/11/2017  . Choledocholithiasis 07/11/2017  . Complicated UTI (urinary tract infection)   . Sepsis secondary to UTI ([place]) 06/14/2017  . Paroxysmal atrial tachycardia ([place]) 06/14/2017  . Hypophosphatemia 06/14/2017  . Pressure injury of skin 05/26/2017  . Hydronephrosis due to obstruction of ureter 05/26/2017  . Obstructive uropathy 05/25/2017  . CKD (chronic kidney disease), stage III ([place]) 08/23/2016  . Hydronephrosis of left kidney 08/23/2016  . Kidney stones 09/13/2015  . Essential hypertension   . Pressure ulcer 08/04/2015  . Chronic diastolic (congestive) heart failure ([place]) 08/04/2015  . Hydronephrosis with obstructing calculus 08/04/2015  . Occult blood positive stool 09/05/2014  . Rash and nonspecific skin eruption 08/25/2014  . Edema 08/19/2014  . Subacute confusional state 08/06/2014  . Dilated cardiomyopathy ([place]) 07/21/2014  . Sepsis ([place]) 07/17/2014  . Severe sepsis ([place]) 07/17/2014  . Headache 03/13/2014  . CVA (cerebral infarction) 03/13/2014  . Abnormality of gait 03/13/2014  . Tremors of nervous system 10/08/2011  . Hypothyroidism 10/08/2011  . Pulmonary nodule 10/08/2011  . Dysphagia 10/07/2011  . HTN (hypertension), malignant 10/06/2011  . Chronic suprapubic catheter ([place]) 10/06/2011  . Pulmonary fibrosis ([place]) 10/06/2011  . UTI (urinary tract infection) 10/03/2011  . PNA (pneumonia) 10/02/2011  . Junctional rhythm 10/02/2011  . Multiple sclerosis ([place]) 10/02/2011  . Chronic diarrhea 06/17/2011  . Hx of adenomatous colonic polyps 06/17/2011  .  High grade dysplasia in colonic adenoma 09/29/2005    No current facility-administered medications on file prior to encounter.   Current Outpatient  Medications on File Prior to Encounter  Medication Sig Dispense Refill  . acetaminophen (TYLENOL) 325 MG tablet Take 2 tablets (650 mg total) by mouth every 6 (six) hours as needed for mild pain or headache (fever >/= 101). 30 tablet 0  . ascorbic acid (VITAMIN C) 500 MG tablet Take 1 tablet (500 mg total) by mouth 2 (two) times daily. 30 tablet 0  . cephALEXin (KEFLEX) 250 MG capsule Take 1 capsule (250 mg total) by mouth at bedtime. 30 capsule 0  . diazepam (VALIUM) 5 MG tablet Take 1 tablet (5 mg total) by mouth every 12 (twelve) hours as needed for muscle spasms. 60 tablet 0  . guaiFENesin-dextromethorphan (ROBITUSSIN DM) 100-10 MG/5ML syrup Take 10 mLs by mouth every 4 (four) hours as needed for cough. 118 mL 0  . HYDROcodone-acetaminophen (NORCO) 7.5-325 MG per tablet Take 1 tablet by mouth 2 (two) times daily. Max APAP 3 GM IN 24 HOURS FROM ALL SOURCES (Patient taking differently: Take 1 tablet by mouth in the morning and at bedtime. ALL MEDICATIONS TO BE CRUSHED AND PLACED IN APPLESAUCE) 60 tablet 0  . Ipratropium-Albuterol (COMBIVENT) 20-100 MCG/ACT AERS respimat Inhale 1 puff into the lungs every 6 (six) hours as needed for wheezing. 4 g 0  . levothyroxine (SYNTHROID) 75 MCG tablet Take 75 mcg by mouth daily before breakfast. ALL MEDICATIONS TO BE CRUSHED AND PLACED IN APPLESAUCE    . linagliptin (TRADJENTA) 5 MG TABS tablet Take 1 tablet (5 mg total) by mouth daily. 30 tablet 0  . Maltodextrin-Xanthan Gum (RESOURCE THICKENUP CLEAR) POWD Use as directed 125 g g  . Multiple Vitamin (MULTIVITAMIN WITH MINERALS) TABS tablet Take 1 tablet by mouth daily. 30 tablet 0    Past Medical History:  Diagnosis Date  . Anemia   . Arthritis   . Back pain, chronic   . Bilateral carotid bruits   . C. difficile colitis 09/2011  . CAD (coronary artery disease)   . Carotid artery stenosis   . Cerebrovascular disease   . Cerebrovascular disease 08/17/2018  . Colon polyps   . Complication of cystostomy  catheter, initial encounter ([place]) 04/20/2020  . Dyslipidemia   . Dysphagia 10/07/2011  . Encephalopathy   . Gait disorder   . HA (headache)   . High grade dysplasia in colonic adenoma 09/2005  . History of kidney stones   . HTN (hypertension), malignant 10/06/2011  . Hypernatremia   . Hypokalemia   . Hypothyroidism 10/08/2011  . Insomnia   . Junctional rhythm   . Kidney stones   . MS (multiple sclerosis) ([place])   . Neuromuscular disorder ([facility])    MS  . OSA (obstructive sleep apnea)   . Paroxysmal atrial tachycardia ([place])   . Peripheral vascular disease ([place])   . Pneumonia 4 yrs ago  . Pulmonary fibrosis ([place]) 10/06/2011  . Pulmonary nodule 10/08/2011  . PVD (peripheral vascular disease) ([place])   . Sacral ulcer ([place])   . Sleep apnea    cannot tolerate  . Stroke [facility])    left sided weakness  . Suprapubic catheter ([place])   . TIA (transient ischemic attack)   . Tremors of nervous system 10/08/2011  . Urinary tract infection   . Ventral hernia without obstruction or gangrene     Large 8X9cm ventral hernia with loss of domain. CT reads report as diastasis  recti with herniation or diastasis recti.  Dr. Constance Haw, Surgery, reviewed CT with radiology and there is herniation with only hernia sac or peritoneum over the bowel and large separation of the rectus muslce (i.e. diastasis recti aka loss of domain).  No surgical intervention recommended given size, age, and health.     Past Surgical History:  Procedure Laterality Date  . APPENDECTOMY  09/2005   at time of left hemicolectomy  . BACK SURGERY  1976/1979   lower  . BILIARY DILATION N/A 03/03/2020   Procedure: BILIARY DILATION;  Surgeon: Rogene Houston, MD;  [facility];  Service: Endoscopy;  Laterality: N/A;  . CATARACT EXTRACTION W/PHACO Right 03/08/2018   Procedure: CATARACT EXTRACTION PHACO AND INTRAOCULAR LENS PLACEMENT RIGHT EYE;  Surgeon: Tonny Branch, MD;  Location: [facility];  Service: Ophthalmology;  Laterality: Right;  CDE: 8.86   . CATARACT EXTRACTION W/PHACO Left 04/05/2018   Procedure: CATARACT EXTRACTION PHACO AND INTRAOCULAR LENS PLACEMENT (IOC);  Surgeon: Tonny Branch, MD;  Location: [facility];  Service: Ophthalmology;  Laterality: Left;  CDE: 7.36  . CHOLECYSTECTOMY     Dr. Tamala Julian  . COLON SURGERY  09/2005   [place]man: four tubular adenomas, large adenomatous polyp with HIGH GRADE dysplasia  . COLONOSCOPY  11/2004   Dr. Sharol Roussel sessile polyp splenic flexure, 26mm sessile polyp desc colon, tubulovillous adenoma (bx not removed)  . COLONOSCOPY  01/2005   poor prep, polyp could not be found  . COLONOSCOPY  05/2005   with EMR, polypectomy Dr. Olegario Messier, bx showed high grade dysplasia, partially resected  . COLONOSCOPY  09/2005   Dr. Arsenio Loader, [place] ink tattooing, four villous colon polyp (3 had been missed on previous colonoscopies due to limitations of procedures  . COLONOSCOPY  09/2006   normal TI, no polyps  . COLONOSCOPY  10/2007   Dr. Imogene Burn distal mammillations, benign bx, normal TI, random bx neg for microscopic colitis  . CYSTOSCOPY WITH LITHOLAPAXY N/A 07/27/2018   Procedure: CYSTOSCOPY WITH LITHOLAPAXY VIA  SUPRAPUBIC TUBE;  Surgeon: Franchot Gallo, MD;  Location: [facility];  Service: Urology;  Laterality: N/A;  . CYSTOSCOPY WITH RETROGRADE PYELOGRAM, URETEROSCOPY AND STENT PLACEMENT Left 06/09/2017   Procedure: CYSTOSCOPY WITH LEFT RETROGRADE PYELOGRAM, LEFT URETEROSCOPY, LEFT URETEROSCOPIC STONE EXTRACTION, LEFT URETERAL STENT PLACEMENT;  Surgeon: Franchot Gallo, MD;  Location: [facility];  Service: Urology;  Laterality: Left;  . CYSTOSCOPY/URETEROSCOPY/HOLMIUM LASER/STENT PLACEMENT Left 05/22/2020   Procedure: CYSTOSCOPY/URETEROSCOPY/STENT PLACEMENT;  Surgeon: Franchot Gallo, MD;  Location: [facility];  Service: Urology;  Laterality: Left;  . ERCP N/A 03/03/2020   Procedure: ENDOSCOPIC RETROGRADE CHOLANGIOPANCREATOGRAPHY (ERCP);  Surgeon: Rogene Houston, MD;  [facility];  Service:  Endoscopy;  Laterality: N/A;  . ERCP N/A 04/20/2020   Procedure: ENDOSCOPIC RETROGRADE CHOLANGIOPANCREATOGRAPHY (ERCP);  Surgeon: Rogene Houston, MD;  [facility];  Service: Endoscopy;  Laterality: N/A;  to be done at 7:30am in OR  . GASTROINTESTINAL STENT REMOVAL N/A 04/20/2020   Procedure: STENT REMOVAL;  Surgeon: Rogene Houston, MD;  [facility];  Service: Endoscopy;  Laterality: N/A;  . INGUINAL HERNIA REPAIR  1971   bilateral  . INSERTION OF SUPRAPUBIC CATHETER  06/09/2017   Procedure: EXCHANGE OF SUPRAPUBIC CATHETER;  Surgeon: Franchot Gallo, MD;  Location: [facility];  Service: Urology;;  . INSERTION OF SUPRAPUBIC CATHETER  05/22/2020   Procedure: SUPRAPUBIC CATHETER EXCHANGE;  Surgeon: Franchot Gallo, MD;  Location: [facility];  Service: Urology;;  . IR NEPHROSTOMY PLACEMENT LEFT  05/26/2017  . IR NEPHROSTOMY PLACEMENT LEFT  05/02/2020  . KIDNEY STONE SURGERY  09/13/2015  . LITHOTRIPSY N/A 03/03/2020   Procedure: MECHANICAL LITHOTRIPSY WITH REMOVAL OF MULTIPLE STONE FRAGMENTS;  Surgeon: Rogene Houston, MD;  [facility];  Service: Endoscopy;  Laterality: N/A;  . NEPHROLITHOTOMY Left 09/13/2015   Procedure: LEFT PERCUTANEOUS NEPHROLITHOTOMY ;  Surgeon: Franchot Gallo, MD;  Location: [facility];  Service: Urology;  Laterality: Left;  . NEPHROSTOMY TUBE REMOVAL  05/22/2020   Procedure: NEPHROSTOMY TUBE REMOVAL;  Surgeon: Franchot Gallo, MD;  Location: [facility];  Service: Urology;;  . REMOVAL OF STONES N/A 04/20/2020   Procedure: REMOVAL OF STONES;  Surgeon: Rogene Houston, MD;  [facility];  Service: Endoscopy;  Laterality: N/A;  . SPHINCTEROTOMY  03/03/2020   Procedure: BILLARY SPHINCTEROTOMY;  Surgeon: Rogene Houston, MD;  [facility];  Service: Endoscopy;;  . SUPRAPUBIC CATHETER INSERTION      Social History   Tobacco Use  . Smoking status: Former Smoker    Packs/day: 1.00    Years: 25.00    Pack years: 25.00    Types:  Cigarettes    Quit date: 03/03/1988    Years since quitting: 32.5  . Smokeless tobacco: Never Used  Vaping Use  . Vaping Use: Never used  Substance Use Topics  . Alcohol use: No  . Drug use: No    Family History  Problem Relation Age of Onset  . Cirrhosis Brother        etoh  . Stroke Mother 62  . Coronary artery disease Father 4  . Heart attack Brother   . Cancer Sister   . Multiple sclerosis Other   . Colon cancer Neg Hx     PE: Vitals:   09/05/20 0440 09/05/20 0500 09/05/20 0539 09/05/20 0540  BP: (!) 122/97 120/71  127/86  Pulse: 99 (!) 109  98  Resp: 18 20  19   Temp:   (!) 100.6 F (38.1 C)   TempSrc:   Oral   SpO2: 98% 93%  92%  Weight:      Height:       Patient appears in moderate distress with pain.  He is thin patient is alert and oriented x3 Atraumatic normocephalic head No cervical or supraclavicular lymphadenopathy appreciated No increased work of breathing, no audible wheezes/rhonchi Regular sinus rhythm/rate Abdomen is soft, with significant CVA tenderness on the left side His suprapubic tube is in place. Lower extremities are symmetric without appreciable edema Grossly neurologically intact No identifiable skin lesions  Recent Labs    09/05/20 0204  WBC 18.4*  HGB 9.6*  HCT 30.9*   Recent Labs    09/05/20 0204 09/05/20 0534  NA 134* 135  K 2.7* 3.3*  CL 98 100  CO2 25 24  GLUCOSE 144* 154*  BUN 39* 40*  CREATININE 3.39* 3.54*  CALCIUM 9.2 8.5*   No results for input(s): LABPT, INR in the last 72 hours. No results for input(s): LABURIN in the last 72 hours. Results for orders placed or performed during the hospital encounter of 09/05/20  Resp Panel by RT-PCR (Flu A&B, Covid) Nasopharyngeal Swab     Status: None   Collection Time: 09/05/20  5:16 AM   Specimen: Nasopharyngeal Swab; Nasopharyngeal(NP) swabs in vial transport medium  Result Value Ref Range Status   SARS Coronavirus 2 by RT PCR NEGATIVE NEGATIVE Final    Comment:  (NOTE) SARS-CoV-2 target nucleic acids are NOT DETECTED.  The SARS-CoV-2 RNA is generally detectable in upper respiratory specimens during the acute phase of infection. The lowest concentration of SARS-CoV-2 viral copies this assay can detect is 138 copies/mL. A negative result does not preclude SARS-Cov-2 infection and should not be used as the sole basis for treatment or other patient management decisions. A negative result may occur with  improper specimen collection/handling, submission of specimen other than nasopharyngeal swab, presence of viral mutation(s) within the areas targeted by this assay, and inadequate number of viral copies(<138 copies/mL). A negative result must be combined with clinical observations, patient history, and epidemiological information. The expected result is Negative.  Fact Sheet for Patients:  EntrepreneurPulse.com.au  Fact Sheet for Healthcare Providers:  IncredibleEmployment.be  This test is no t yet approved or cleared by the [place] FDA and  has been authorized for detection and/or diagnosis of SARS-CoV-2 by FDA under an Emergency Use Authorization (EUA). This EUA will remain  in effect (meaning this test can be used) for the duration of the COVID-19 declaration under Section 564(b)(1) of the Act, 21 U.S.C.section 360bbb-3(b)(1), unless the authorization is terminated  or revoked sooner.       Influenza A by PCR NEGATIVE NEGATIVE Final   Influenza B by PCR NEGATIVE NEGATIVE Final    Comment: (NOTE) The Xpert Xpress SARS-CoV-2/FLU/RSV plus assay is intended as an aid in the diagnosis of influenza from Nasopharyngeal swab specimens and should not be used as a sole basis for treatment. Nasal washings and aspirates are unacceptable for Xpert Xpress SARS-CoV-2/FLU/RSV testing.  Fact Sheet for Patients: EntrepreneurPulse.com.au  Fact Sheet for Healthcare  Providers: IncredibleEmployment.be  This test is not yet approved or cleared by the [place] FDA and has been authorized for detection and/or diagnosis of SARS-CoV-2 by FDA under an Emergency Use Authorization (EUA). This EUA will remain in effect (meaning this test can be used) for the duration of the COVID-19 declaration under Section 564(b)(1) of the Act, 21 U.S.C. section 360bbb-3(b)(1), unless the authorization is terminated or revoked.  Performed at [facility], [address] [place],  [address]     Imaging: I reviewed the patient's CT scan which demonstrates a 1 cm mid left ureteral stone and atrophic left kidney.  His got significant hydronephrosis.  Imp: The patient has a large obstructing stone in the left ureter with acute renal failure and significant pain.  Fortunately has no evidence of infection.  Recommendations: Plan is to place a stent urgently in the operating room in the left ureter.  The patient would then be admitted to the medicine service.   Ardis Hughs

## 2020-09-05 NOTE — H&P (Signed)
History and Physical    CHIEF WALKUP ZHG:[id number] DOB: Jan 29, 1941 DOA: 09/05/2020  PCP: Noreene Larsson, NP   Patient coming from: Home  I have personally briefly reviewed patient's old medical records in [place]  Chief Complaint: Fever and decreased urine output through suprapubic catheter.  HPI: Jordan Ward is a 79 y.o. male with medical history significant of coronary disease, multiple sclerosis, paroxysmal atrial fibrillation, recent history of [place] with respiratory failure, recent history of obstructive left ureteral stone requiring a stent placement and nephrostomy tube and chronic indwelling Foley catheter; who presented to the emergency department secondary to left flank pain, fever and decreased urine output through his suprapubic catheter.  Patient reports no nausea, no vomiting, no shortness of breath, no chest pain, no sick contacts.  Per records review patient was seen and evaluated by urology service on 09/04/2020, at that time he has a left double-J stent removed and has cystoscopy with suprapubic catheter exchange.  Of note, since August 2021 patient have been experiencing significant problems secondary to ureteral stone, obstructive uropathy, the need for percutaneous nephrostomy tube placement, associated UTIs and multiple cystoscopies.  ED Course: Febrile, tachycardic, with elevated WBCs of 18.4 on presentation, hypokalemia (potassium 2.7), creatinine of 3.39 (baseline 1.4-1.6).  Patient made sepsis criteria on presentation.  Normal lactic acid.  COVID-19 negative on respiratory panel by PCR.  Cultures taken, IV antibiotics started and neurology service contacted with plans for patient to go to the OR for urgently placement of left ureter stent and cystoscopy.  Review of Systems: As per HPI otherwise all other systems reviewed and are negative.   Past Medical History:  Diagnosis Date  . Anemia   . Arthritis   . Back pain, chronic   . Bilateral carotid  bruits   . C. difficile colitis 09/2011  . CAD (coronary artery disease)   . Carotid artery stenosis   . Cerebrovascular disease   . Cerebrovascular disease 08/17/2018  . Colon polyps   . Complication of cystostomy catheter, initial encounter ([place]) 04/20/2020  . Dyslipidemia   . Dysphagia 10/07/2011  . Encephalopathy   . Gait disorder   . HA (headache)   . High grade dysplasia in colonic adenoma 09/2005  . History of kidney stones   . HTN (hypertension), malignant 10/06/2011  . Hypernatremia   . Hypokalemia   . Hypothyroidism 10/08/2011  . Insomnia   . Junctional rhythm   . Kidney stones   . MS (multiple sclerosis) ([place])   . Neuromuscular disorder (HCC)    MS  . OSA (obstructive sleep apnea)   . Paroxysmal atrial tachycardia ([place])   . Peripheral vascular disease ([place])   . Pneumonia 4 yrs ago  . Pulmonary fibrosis ([place]) 10/06/2011  . Pulmonary nodule 10/08/2011  . PVD (peripheral vascular disease) ([place])   . Sacral ulcer ([place])   . Sleep apnea    cannot tolerate  . Stroke [facility])    left sided weakness  . Suprapubic catheter ([place])   . TIA (transient ischemic attack)   . Tremors of nervous system 10/08/2011  . Urinary tract infection   . Ventral hernia without obstruction or gangrene     Large 8X9cm ventral hernia with loss of domain. CT reads report as diastasis recti with herniation or diastasis recti.  Dr. Constance Haw, Surgery, reviewed CT with radiology and there is herniation with only hernia sac or peritoneum over the bowel and large separation of the rectus muslce (i.e. diastasis recti aka loss of  domain).  No surgical intervention recommended given size, age, and health.     Past Surgical History:  Procedure Laterality Date  . APPENDECTOMY  09/2005   at time of left hemicolectomy  . BACK SURGERY  1976/1979   lower  . BILIARY DILATION N/A 03/03/2020   Procedure: BILIARY DILATION;  Surgeon: Rogene Houston, MD;  [facility];  Service: Endoscopy;  Laterality: N/A;  .  CATARACT EXTRACTION W/PHACO Right 03/08/2018   Procedure: CATARACT EXTRACTION PHACO AND INTRAOCULAR LENS PLACEMENT RIGHT EYE;  Surgeon: Tonny Branch, MD;  Location: [facility];  Service: Ophthalmology;  Laterality: Right;  CDE: 8.86  . CATARACT EXTRACTION W/PHACO Left 04/05/2018   Procedure: CATARACT EXTRACTION PHACO AND INTRAOCULAR LENS PLACEMENT (IOC);  Surgeon: Tonny Branch, MD;  Location: [facility];  Service: Ophthalmology;  Laterality: Left;  CDE: 7.36  . CHOLECYSTECTOMY     Dr. Tamala Julian  . COLON SURGERY  09/2005   [place]: four tubular adenomas, large adenomatous polyp with HIGH GRADE dysplasia  . COLONOSCOPY  11/2004   Dr. Sharol Roussel sessile polyp splenic flexure, 86m sessile polyp desc colon, tubulovillous adenoma (bx not removed)  . COLONOSCOPY  01/2005   poor prep, polyp could not be found  . COLONOSCOPY  05/2005   with EMR, polypectomy Dr. JOlegario Messier bx showed high grade dysplasia, partially resected  . COLONOSCOPY  09/2005   Dr. GArsenio Loader [employer] tattooing, four villous colon polyp (3 had been missed on previous colonoscopies due to limitations of procedures  . COLONOSCOPY  09/2006   normal TI, no polyps  . COLONOSCOPY  10/2007   Dr. RImogene Burndistal mammillations, benign bx, normal TI, random bx neg for microscopic colitis  . CYSTOSCOPY WITH LITHOLAPAXY N/A 07/27/2018   Procedure: CYSTOSCOPY WITH LITHOLAPAXY VIA  SUPRAPUBIC TUBE;  Surgeon: DFranchot Gallo MD;  Location: [facility];  Service: Urology;  Laterality: N/A;  . CYSTOSCOPY WITH RETROGRADE PYELOGRAM, URETEROSCOPY AND STENT PLACEMENT Left 06/09/2017   Procedure: CYSTOSCOPY WITH LEFT RETROGRADE PYELOGRAM, LEFT URETEROSCOPY, LEFT URETEROSCOPIC STONE EXTRACTION, LEFT URETERAL STENT PLACEMENT;  Surgeon: DFranchot Gallo MD;  Location: [facility];  Service: Urology;  Laterality: Left;  . CYSTOSCOPY/URETEROSCOPY/HOLMIUM LASER/STENT PLACEMENT Left 05/22/2020   Procedure: CYSTOSCOPY/URETEROSCOPY/STENT PLACEMENT;  Surgeon: DFranchot Gallo MD;  Location: [facility];  Service: Urology;  Laterality: Left;  . ERCP N/A 03/03/2020   Procedure: ENDOSCOPIC RETROGRADE CHOLANGIOPANCREATOGRAPHY (ERCP);  Surgeon: RRogene Houston MD;  [facility];  Service: Endoscopy;  Laterality: N/A;  . ERCP N/A 04/20/2020   Procedure: ENDOSCOPIC RETROGRADE CHOLANGIOPANCREATOGRAPHY (ERCP);  Surgeon: RRogene Houston MD;  [facility];  Service: Endoscopy;  Laterality: N/A;  to be done at 7:30am in OR  . GASTROINTESTINAL STENT REMOVAL N/A 04/20/2020   Procedure: STENT REMOVAL;  Surgeon: RRogene Houston MD;  [facility];  Service: Endoscopy;  Laterality: N/A;  . INGUINAL HERNIA REPAIR  1971   bilateral  . INSERTION OF SUPRAPUBIC CATHETER  06/09/2017   Procedure: EXCHANGE OF SUPRAPUBIC CATHETER;  Surgeon: DFranchot Gallo MD;  Location: [facility];  Service: Urology;;  . INSERTION OF SUPRAPUBIC CATHETER  05/22/2020   Procedure: SUPRAPUBIC CATHETER EXCHANGE;  Surgeon: DFranchot Gallo MD;  Location: [facility];  Service: Urology;;  . IR NEPHROSTOMY PLACEMENT LEFT  05/26/2017  . IR NEPHROSTOMY PLACEMENT LEFT  05/02/2020  . KIDNEY STONE SURGERY  09/13/2015  . LITHOTRIPSY N/A 03/03/2020   Procedure: MECHANICAL LITHOTRIPSY WITH REMOVAL OF MULTIPLE STONE FRAGMENTS;  Surgeon: RRogene Houston MD;  [facility]  [facility];  Service: Endoscopy;  Laterality: N/A;  . NEPHROLITHOTOMY Left 09/13/2015   Procedure: LEFT PERCUTANEOUS NEPHROLITHOTOMY ;  Surgeon: Franchot Gallo, MD;  Location: [facility];  Service: Urology;  Laterality: Left;  . NEPHROSTOMY TUBE REMOVAL  05/22/2020   Procedure: NEPHROSTOMY TUBE REMOVAL;  Surgeon: Franchot Gallo, MD;  Location: [facility];  Service: Urology;;  . REMOVAL OF STONES N/A 04/20/2020   Procedure: REMOVAL OF STONES;  Surgeon: Rogene Houston, MD;  [facility];  Service: Endoscopy;  Laterality: N/A;  . SPHINCTEROTOMY  03/03/2020   Procedure: BILLARY SPHINCTEROTOMY;  Surgeon: Rogene Houston,  MD;  [facility];  Service: Endoscopy;;  . SUPRAPUBIC CATHETER INSERTION      Social History  reports that he quit smoking about 32 years ago. His smoking use included cigarettes. He has a 25.00 pack-year smoking history. He has never used smokeless tobacco. He reports that he does not drink alcohol and does not use drugs.  Allergies  Allergen Reactions  . Tetracyclines & Related Anaphylaxis and Rash  . Ciprofloxacin     Trouble swallowing unknown reaction according to wife     Family History  Problem Relation Age of Onset  . Cirrhosis Brother        etoh  . Stroke Mother 73  . Coronary artery disease Father 67  . Heart attack Brother   . Cancer Sister   . Multiple sclerosis Other   . Colon cancer Neg Hx     Prior to Admission medications   Medication Sig Start Date End Date Taking? Authorizing Provider  acetaminophen (TYLENOL) 325 MG tablet Take 2 tablets (650 mg total) by mouth every 6 (six) hours as needed for mild pain or headache (fever >/= 101). 07/13/20   Allie Bossier, MD  ascorbic acid (VITAMIN C) 500 MG tablet Take 1 tablet (500 mg total) by mouth 2 (two) times daily. 07/13/20   Allie Bossier, MD  cephALEXin (KEFLEX) 250 MG capsule Take 1 capsule (250 mg total) by mouth at bedtime. 07/13/20   Allie Bossier, MD  diazepam (VALIUM) 5 MG tablet Take 1 tablet (5 mg total) by mouth every 12 (twelve) hours as needed for muscle spasms. 08/29/20   Star Age, MD  guaiFENesin-dextromethorphan (ROBITUSSIN DM) 100-10 MG/5ML syrup Take 10 mLs by mouth every 4 (four) hours as needed for cough. 07/13/20   Allie Bossier, MD  HYDROcodone-acetaminophen (NORCO) 7.5-325 MG per tablet Take 1 tablet by mouth 2 (two) times daily. Max APAP 3 GM IN 24 HOURS FROM ALL SOURCES Patient taking differently: Take 1 tablet by mouth in the morning and at bedtime. ALL MEDICATIONS TO BE CRUSHED AND PLACED IN APPLESAUCE 08/22/14   Blanchie Serve, MD  Ipratropium-Albuterol (COMBIVENT)  20-100 MCG/ACT AERS respimat Inhale 1 puff into the lungs every 6 (six) hours as needed for wheezing. 07/13/20   Allie Bossier, MD  levothyroxine (SYNTHROID) 75 MCG tablet Take 75 mcg by mouth daily before breakfast. ALL MEDICATIONS TO BE CRUSHED AND PLACED IN APPLESAUCE 12/21/19   [provider]  linagliptin (TRADJENTA) 5 MG TABS tablet Take 1 tablet (5 mg total) by mouth daily. 07/14/20   Allie Bossier, MD  Maltodextrin-Xanthan Gum [facility] CLEAR) POWD Use as directed 07/13/20   Allie Bossier, MD  Multiple Vitamin (MULTIVITAMIN WITH MINERALS) TABS tablet Take 1 tablet by mouth daily. 07/14/20   Allie Bossier, MD    Physical Exam: Vitals:   09/05/20 0815 09/05/20  0830 09/05/20 0833 09/05/20 0836  BP: 117/72 114/60    Pulse: 84 74 78 75  Resp: '15 15 15 13  ' Temp:    98.9 F (37.2 C)  TempSrc:      SpO2: 97% 96% 93%   Weight:      Height:        Constitutional: Chronically ill in appearance; nontoxic, currently afebrile.  Denies chest pain or shortness of breath.  Vitals:   09/05/20 0815 09/05/20 0830 09/05/20 0833 09/05/20 0836  BP: 117/72 114/60    Pulse: 84 74 78 75  Resp: '15 15 15 13  ' Temp:    98.9 F (37.2 C)  TempSrc:      SpO2: 97% 96% 93%   Weight:      Height:       Eyes: PERRL, lids and conjunctivae normal, no icterus. ENMT: Mucous membranes are dry on examination. Posterior pharynx clear of any exudate or lesions. Neck: normal, supple, no masses, no thyromegaly, no JVD. Respiratory: clear to auscultation bilaterally, no wheezing, no crackles. Normal respiratory effort. No accessory muscle use.  Cardiovascular: Regular rate and rhythm, no rubs, no gallops, no lower extremity edema appreciated. Abdomen: no guarding, no masses palpated. No hepatosplenomegaly. Bowel sounds positive.  Positive left flank discomfort on palpation. Musculoskeletal: no clubbing / cyanosis. No joint deformity upper and lower extremities. Good ROM, no contractures.  Normal muscle tone.  Skin: no rashes, no petechiae. Neurologic: CN 2-12 grossly intact. Sensation intact, DTR normal. Strength 5/5 in all 4.  Psychiatric: Normal judgment and insight. Alert and oriented x 3. Normal mood.   Labs on Admission: I have personally reviewed following labs and imaging studies  CBC: Recent Labs  Lab 09/05/20 0204  WBC 18.4*  NEUTROABS 15.7*  HGB 9.6*  HCT 30.9*  MCV 96.6  PLT 144    Basic Metabolic Panel: Recent Labs  Lab 09/05/20 0204 09/05/20 0534  NA 134* 135  K 2.7* 3.3*  CL 98 100  CO2 25 24  GLUCOSE 144* 154*  BUN 39* 40*  CREATININE 3.39* 3.54*  CALCIUM 9.2 8.5*    GFR: Estimated Creatinine Clearance: 12.1 mL/min (A) (by C-G formula based on SCr of 3.54 mg/dL (H)).  Liver Function Tests: No results for input(s): AST, ALT, ALKPHOS, BILITOT, PROT, ALBUMIN in the last 168 hours.  Urine analysis:    Component Value Date/Time   COLORURINE YELLOW 07/04/2020 1956   APPEARANCEUR CLOUDY (A) 07/04/2020 1956   LABSPEC 1.013 07/04/2020 1956   PHURINE 6.0 07/04/2020 1956   GLUCOSEU NEGATIVE 07/04/2020 1956   HGBUR MODERATE (A) 07/04/2020 1956   BILIRUBINUR NEGATIVE 07/04/2020 1956   KETONESUR NEGATIVE 07/04/2020 1956   PROTEINUR 30 (A) 07/04/2020 1956   UROBILINOGEN 0.2 07/17/2014 1215   NITRITE POSITIVE (A) 07/04/2020 1956   LEUKOCYTESUR LARGE (A) 07/04/2020 1956    Radiological Exams on Admission: DG Retrograde Pyelogram  Result Date: 09/05/2020 CLINICAL DATA:  Left hydronephrosis.  Left ureter stent placement. EXAM: RETROGRADE PYELOGRAM COMPARISON:  CT 09/05/2020 FINDINGS: Intraoperative images demonstrate placement of a left double-J ureter stent. Proximal and distal aspect of the stent appear to be in the expected locations. IMPRESSION: Placement of left ureter stent. Electronically Signed   By: Markus Daft M.D.   On: 09/05/2020 08:43   CT Renal Stone Study  Result Date: 09/05/2020 CLINICAL DATA:  Urinary retention. EXAM: CT  ABDOMEN AND PELVIS WITHOUT CONTRAST TECHNIQUE: Multidetector CT imaging of the abdomen and pelvis was performed following the standard protocol without  IV contrast. COMPARISON:  05/02/2020 FINDINGS: Lower chest: There are coarse reticular airspace opacities at the lung bases bilaterally with associated ground-glass opacification and bronchiectasis. This is new since the patient's CT dated 05/02/2020.The heart is enlarged. The intracardiac blood pool is hypodense relative to the adjacent myocardium consistent with anemia. Hepatobiliary: There is no discrete hepatic mass. The patient is status post prior cholecystectomy.Again noted is pneumobilia. There is significant intrahepatic and extrahepatic biliary ductal dilatation. Pancreas: Normal contours without ductal dilatation. No peripancreatic fluid collection. Spleen: The spleen appears to be borderline enlarged. Adrenals/Urinary Tract: --Adrenal glands: Unremarkable. --Right kidney/ureter: The right kidney is atrophic with multiple nonobstructing stones. --Left kidney/ureter: There is severe left-sided hydroureteronephrosis secondary to a 1 cm stone in the distal left ureter. Additional nonobstructing stones are noted in the left kidney --Urinary bladder: The urinary bladder is decompressed with a suprapubic catheter in place multiple stones are noted within the decompressed urinary bladder. Stomach/Bowel: --Stomach/Duodenum: No hiatal hernia or other gastric abnormality. Normal duodenal course and caliber. --Small bowel: Unremarkable. --Colon: Unremarkable. --Appendix: Surgically absent. Vascular/Lymphatic: There are advanced vascular calcifications of the abdominal aorta, again with findings suspicious for occlusion of the distal infrarenal abdominal aorta at the level of the aortic bifurcation. 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 --there is a left periaortic lymph node that is enlarged measuring approximately 1.2 cm in the  short axis (axial series 2, image 29). There are additional smaller mildly enlarged retroperitoneal lymph nodes that are presumably reactive. --No mesenteric lymphadenopathy. --No pelvic or inguinal lymphadenopathy. Reproductive: The prostate gland is enlarged. Other: No ascites or free air. The abdominal Franta is normal. Musculoskeletal. 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 IMPRESSION: 1. Severe left-sided hydroureteronephrosis secondary to a 1 cm stone in the distal left ureter. 2. Bilateral nonobstructing nephrolithiasis.  Atrophic right kidney. 3. There are coarse reticular airspace opacities at the lung bases bilaterally with associated ground-glass opacification and bronchiectasis. This is new since the patient's CT dated 05/02/2020. Given the short interval development, findings are favored to be secondary to an infectious or inflammatory process such as viral pneumonia. 4. Intrahepatic and extrahepatic biliary ductal dilatation. Correlation with laboratory studies is recommended. 5. Anemia. Aortic Atherosclerosis (ICD10-I70.0). Electronically Signed   By: Constance Holster M.D.   On: 09/05/2020 03:16    EKG: Independently reviewed.  No acute ischemic changes.  Left ventricle hypertrophy by voltage.  Normal sinus rhythm.  Assessment/Plan 1-sepsis secondary to obstructive uropathy/UTI -Patient met sepsis criteria on presentation with elevated heart rate, fever, elevated WBCs and source of infection identified by abnormal CT findings demonstrating pyelonephritis with severe left-sided hydroureteronephrosis and abnormal UA. -normal lactic acid -will follow urine and blood cultures -Continue IV antibiotics -Continue IV fluids and supportive care -Follow urology service recommendation -Patient taken to the OR for stent placement and cystoscopy.  2-acute on chronic renal failure -Stage IV at baseline -In the setting of obstructive uropathy and UTI -Continue to provide IV  fluid -Stent placement by urology service -Continue IV antibiotics and follow renal function trend. -Avoid contrast, nephrotoxic agents and hypotension.  3-type 2 diabetes mellitus -Will use a sliding scale insulin and continue the use of Tradjenta -Follow CBGs and adjust hypoglycemic regimen as required.  4-hypokalemia -In the setting of poor oral intake and dehydration -Will replete as needed and follow electrolytes trend -Check a magnesium level.  5-COPD -Overall stable and well-controlled -Currently no wheezing -Continue as needed bronchodilators.  6-hypothyroidism -Continue Synthroid.  DVT prophylaxis:  Heparin Code Status:  Full code Family Communication:  No family at bedside. Disposition Plan:   Patient is from:  Home  Anticipated DC to:  Home  Anticipated DC date:  To be determined  Anticipated DC barriers: Stabilization of renal function and resolution of sepsis features.  Consults called:  Urology service (Dr. Louis Meckel) Admission status:  Inpatient status, MedSurg, length of stay more than 2 midnights.  Severity of Illness: Moderate to severe illness; patient presenting with sepsis secondary to UTI and obstructive uropathy; will be taken to the OR for urgent stent placement and IV antibiotics.  Will provide fluid resuscitation, minimize the use of nephrotoxic agents and follow renal function trend.  Cultures have been taken and will follow results.   Barton Dubois MD [facility]  How to contact the [facility] Attending or Consulting provider [place] or covering provider during after hours [place], for this patient?   1. Check the care team in [facility] and look for a) attending/consulting [facility] provider listed and b) the [facility] team listed 2. Log into [email or web address] and use [place]'s universal password to access. If you do not have the password, please contact the hospital operator. 3. Locate the [facility] provider you are looking for under [facility] and page  to a number that you can be directly reached. 4. If you still have difficulty reaching the provider, please page the [facility] (Director on Call) for the Hospitalists listed on amion for assistance.  09/05/2020, 8:57 AM

## 2020-09-05 NOTE — Anesthesia Procedure Notes (Signed)
Procedure Name: LMA Insertion Date/Time: 09/05/2020 7:20 AM Performed by: Gayland Curry, CRNA Pre-anesthesia Checklist: Patient identified, Emergency Drugs available, Suction available and Patient being monitored Patient Re-evaluated:Patient Re-evaluated prior to induction Oxygen Delivery Method: Circle system utilized Preoxygenation: Pre-oxygenation with 100% oxygen Induction Type: IV induction LMA: LMA inserted LMA Size: 4.0 Number of attempts: 1 Placement Confirmation: positive ETCO2 and breath sounds checked- equal and bilateral Tube secured with: Tape Dental Injury: Teeth and Oropharynx as per pre-operative assessment

## 2020-09-05 NOTE — ED Notes (Signed)
Pt transported to CT ?

## 2020-09-05 NOTE — Anesthesia Postprocedure Evaluation (Signed)
Anesthesia Post Note  Patient: Jordan Ward  Procedure(s) Performed: CYSTOSCOPY WITH RETROGRADE PYELOGRAM/URETERAL STENT PLACEMENT (Left )  Anesthesia Type: General Level of consciousness: awake and alert and oriented Pain management: pain level controlled Respiratory status: spontaneous breathing, respiratory function stable and patient connected to nasal cannula oxygen Cardiovascular status: blood pressure returned to baseline and stable Postop Assessment: no apparent nausea or vomiting Anesthetic complications: no   No complications documented.   Last Vitals:  Vitals:   09/05/20 0833 09/05/20 0836  BP:    Pulse: 78 75  Resp: 15 13  Temp:  37.2 C  SpO2: 93%     Last Pain:  Vitals:   09/05/20 0830  TempSrc:   PainSc: 4                  Jacelynn Hayton C Emmalee Solivan

## 2020-09-05 NOTE — ED Notes (Signed)
Pt returned from CT °

## 2020-09-05 NOTE — ED Provider Notes (Addendum)
[facility] EMERGENCY DEPARTMENT Provider Note   CSN: [id number] Arrival date & time: 09/05/20  0119     History Chief Complaint  Patient presents with  . suprapubic cath problem    Jordan Ward is a 79 y.o. male.  HPI     This is a 79 year old male with a history of coronary artery disease, MS, paroxysmal atrial fibrillation, recent history of COVID-19 with respiratory failure, recent history of obstructive left ureteral stone requiring stent placement and nephrostomy tube, and chronic indwelling Foley catheter who presents with difficulty with his Foley draining.  Patient reports that his Foley catheter was replaced at the urology office yesterday.  Since that time he has had minimal output.  He also had a left ureteral stent removed.  He states that he has developed sharp left-sided flank pain that is nonradiating.  Rates his pain at 10 out of 10.  No abdominal pain.  He states "I wonder if my kidneys are working."  Denies shortness of breath, chest pain.  Chart reviewed.  Patient was seen and evaluated yesterday by Dr. [name].  He had a left double-J stent removed.  He also had a cystoscopy and suprapubic catheter change.  His recent urologic history is remarkable for a left percutaneous nephrostomy tube placed in August 2021 for an infected ureteral stone.  He subsequently has had ureteral stent, UTI, and multiple cystoscopies.  In the interim he also contracted COVID-19 and has had significant weight loss.  5:47 AM Wife is now at the bedside.  Reports that he has had fevers off and on over the last several weeks.  He was placed on Keflex by his primary physician he was also given IM gentamicin post procedure today in the urology office.  At 5 PM he had a fever of 102 which self resolved after she gave him another dose of Keflex.  Past Medical History:  Diagnosis Date  . Anemia   . Arthritis   . Back pain, chronic   . Bilateral carotid bruits   . C. difficile colitis 09/2011   . CAD (coronary artery disease)   . Carotid artery stenosis   . Cerebrovascular disease   . Cerebrovascular disease 08/17/2018  . Colon polyps   . Complication of cystostomy catheter, initial encounter ([place]) 04/20/2020  . Dyslipidemia   . Dysphagia 10/07/2011  . Encephalopathy   . Gait disorder   . HA (headache)   . High grade dysplasia in colonic adenoma 09/2005  . History of kidney stones   . HTN (hypertension), malignant 10/06/2011  . Hypernatremia   . Hypokalemia   . Hypothyroidism 10/08/2011  . Insomnia   . Junctional rhythm   . Kidney stones   . MS (multiple sclerosis) ([place])   . Neuromuscular disorder (HCC)    MS  . OSA (obstructive sleep apnea)   . Paroxysmal atrial tachycardia ([place])   . Peripheral vascular disease ([place])   . Pneumonia 4 yrs ago  . Pulmonary fibrosis ([place]) 10/06/2011  . Pulmonary nodule 10/08/2011  . PVD (peripheral vascular disease) ([place])   . Sacral ulcer ([place])   . Sleep apnea    cannot tolerate  . Stroke [facility])    left sided weakness  . Suprapubic catheter ([place])   . TIA (transient ischemic attack)   . Tremors of nervous system 10/08/2011  . Urinary tract infection   . Ventral hernia without obstruction or gangrene     Large 8X9cm ventral hernia with loss of domain. CT reads report as  diastasis recti with herniation or diastasis recti.  Dr. Constance Haw, Surgery, reviewed CT with radiology and there is herniation with only hernia sac or peritoneum over the bowel and large separation of the rectus muslce (i.e. diastasis recti aka loss of domain).  No surgical intervention recommended given size, age, and health.     Patient Active Problem List   Diagnosis Date Noted  . Acute respiratory failure with hypoxia ([place]) 07/12/2020  . Pneumonia due to COVID-19 virus 07/12/2020  . Encephalopathy due to COVID-19 virus 07/12/2020  . Acute hypoxemic respiratory failure due to COVID-19 ([place]) 07/03/2020  . Calculus of ureter 05/15/2020  . Bacteremia due to Gram-negative  bacteria 05/04/2020  . Acute lower UTI 05/04/2020  . Acute unilateral obstructive uropathy   . SIRS (systemic inflammatory response syndrome) ([place]) 05/03/2020  . Hydronephrosis 05/02/2020  . Complication of cystostomy catheter, subsequent encounter 04/20/2020  . Acute kidney injury superimposed on CKD ([place]) 03/02/2020  . Dehydration 03/02/2020  . Nausea and vomiting 03/02/2020  . Ventral hernia without obstruction or gangrene 03/01/2020  . Hypotension 03/01/2020  . Poor appetite 03/01/2020  . Urinary tract infection 03/01/2020  . Detrusor areflexia 09/16/2019  . Cerebrovascular disease 08/17/2018  . Partial small bowel obstruction ([place]) 07/11/2017  . Choledocholithiasis 07/11/2017  . Complicated UTI (urinary tract infection)   . Sepsis secondary to UTI ([place]) 06/14/2017  . Paroxysmal atrial tachycardia ([place]) 06/14/2017  . Hypophosphatemia 06/14/2017  . Pressure injury of skin 05/26/2017  . Hydronephrosis due to obstruction of ureter 05/26/2017  . Obstructive uropathy 05/25/2017  . CKD (chronic kidney disease), stage III ([place]) 08/23/2016  . Hydronephrosis of left kidney 08/23/2016  . Kidney stones 09/13/2015  . Essential hypertension   . Pressure ulcer 08/04/2015  . Chronic diastolic (congestive) heart failure ([place]) 08/04/2015  . Hydronephrosis with obstructing calculus 08/04/2015  . Occult blood positive stool 09/05/2014  . Rash and nonspecific skin eruption 08/25/2014  . Edema 08/19/2014  . Subacute confusional state 08/06/2014  . Dilated cardiomyopathy ([place]) 07/21/2014  . Sepsis ([place]) 07/17/2014  . Severe sepsis ([place]) 07/17/2014  . Headache 03/13/2014  . CVA (cerebral infarction) 03/13/2014  . Abnormality of gait 03/13/2014  . Tremors of nervous system 10/08/2011  . Hypothyroidism 10/08/2011  . Pulmonary nodule 10/08/2011  . Dysphagia 10/07/2011  . HTN (hypertension), malignant 10/06/2011  . Chronic suprapubic catheter ([place]) 10/06/2011  . Pulmonary fibrosis ([place])  10/06/2011  . UTI (urinary tract infection) 10/03/2011  . PNA (pneumonia) 10/02/2011  . Junctional rhythm 10/02/2011  . Multiple sclerosis ([place]) 10/02/2011  . Chronic diarrhea 06/17/2011  . Hx of adenomatous colonic polyps 06/17/2011  . High grade dysplasia in colonic adenoma 09/29/2005    Past Surgical History:  Procedure Laterality Date  . APPENDECTOMY  09/2005   at time of left hemicolectomy  . BACK SURGERY  1976/1979   lower  . BILIARY DILATION N/A 03/03/2020   Procedure: BILIARY DILATION;  Surgeon: Rogene Houston, MD;  Location: [facility] ENDO SUITE;  Service: Endoscopy;  Laterality: N/A;  . CATARACT EXTRACTION W/PHACO Right 03/08/2018   Procedure: CATARACT EXTRACTION PHACO AND INTRAOCULAR LENS PLACEMENT RIGHT EYE;  Surgeon: Tonny Branch, MD;  Location: [facility] ORS;  Service: Ophthalmology;  Laterality: Right;  CDE: 8.86  . CATARACT EXTRACTION W/PHACO Left 04/05/2018   Procedure: CATARACT EXTRACTION PHACO AND INTRAOCULAR LENS PLACEMENT (IOC);  Surgeon: Tonny Branch, MD;  Location: [facility] ORS;  Service: Ophthalmology;  Laterality: Left;  CDE: 7.36  . CHOLECYSTECTOMY     Dr. Tamala Julian  . COLON  SURGERY  09/2005   [place]: four tubular adenomas, large adenomatous polyp with HIGH GRADE dysplasia  . COLONOSCOPY  11/2004   Dr. Sharol Roussel sessile polyp splenic flexure, 68mm sessile polyp desc colon, tubulovillous adenoma (bx not removed)  . COLONOSCOPY  01/2005   poor prep, polyp could not be found  . COLONOSCOPY  05/2005   with EMR, polypectomy Dr. Olegario Messier, bx showed high grade dysplasia, partially resected  . COLONOSCOPY  09/2005   Dr. Arsenio Loader, [place] ink tattooing, four villous colon polyp (3 had been missed on previous colonoscopies due to limitations of procedures  . COLONOSCOPY  09/2006   normal TI, no polyps  . COLONOSCOPY  10/2007   Dr. Imogene Burn distal mammillations, benign bx, normal TI, random bx neg for microscopic colitis  . CYSTOSCOPY WITH LITHOLAPAXY N/A 07/27/2018   Procedure:  CYSTOSCOPY WITH LITHOLAPAXY VIA  SUPRAPUBIC TUBE;  Surgeon: Franchot Gallo, MD;  Location: [facility] ORS;  Service: Urology;  Laterality: N/A;  . CYSTOSCOPY WITH RETROGRADE PYELOGRAM, URETEROSCOPY AND STENT PLACEMENT Left 06/09/2017   Procedure: CYSTOSCOPY WITH LEFT RETROGRADE PYELOGRAM, LEFT URETEROSCOPY, LEFT URETEROSCOPIC STONE EXTRACTION, LEFT URETERAL STENT PLACEMENT;  Surgeon: Franchot Gallo, MD;  Location: [facility] ORS;  Service: Urology;  Laterality: Left;  . CYSTOSCOPY/URETEROSCOPY/HOLMIUM LASER/STENT PLACEMENT Left 05/22/2020   Procedure: CYSTOSCOPY/URETEROSCOPY/STENT PLACEMENT;  Surgeon: Franchot Gallo, MD;  Location: [facility] ORS;  Service: Urology;  Laterality: Left;  . ERCP N/A 03/03/2020   Procedure: ENDOSCOPIC RETROGRADE CHOLANGIOPANCREATOGRAPHY (ERCP);  Surgeon: Rogene Houston, MD;  Location: [facility] SU[facility];  Service: Endoscopy;  Laterality: N/A;  . ERCP N/A 04/20/2020   Procedure: ENDOSCOPIC RETROGRADE CHOLANGIOPANCREATOGRAPHY (ERCP);  Surgeon: Rogene Houston, MD;  Location: [facility] END[facility];  Service: Endoscopy;  Laterality: N/A;  to be done at 7:30am in OR  . GASTROINTESTINAL STENT REMOVAL N/A 04/20/2020   Procedure: STENT REMOVAL;  Surgeon: Rogene Houston, MD;  Location: [facility];  Service: Endoscopy;  Laterality: N/A;  . INGUINAL HERNIA REPAIR  1971   bilateral  . INSERTION OF SUPRAPUBIC CATHETER  06/09/2017   Procedure: EXCHANGE OF SUPRAPUBIC CATHETER;  Surgeon: Franchot Gallo, MD;  Location: [facility] ORS;  Service: Urology;;  . INSERTION OF SUPRAPUBIC CATHETER  05/22/2020   Procedure: SUPRAPUBIC CATHETER EXCHANGE;  Surgeon: Franchot Gallo, MD;  Location: [facility] ORS;  Service: Urology;;  . IR NEPHROSTOMY PLACEMENT LEFT  05/26/2017  . IR NEPHROSTOMY PLACEMENT LEFT  05/02/2020  . KIDNEY STONE SURGERY  09/13/2015  . LITHOTRIPSY N/A 03/03/2020   Procedure: MECHANICAL LITHOTRIPSY WITH REMOVAL OF MULTIPLE STONE FRAGMENTS;  Surgeon: Rogene Houston, MD;  Location: [facility];  Service:  Endoscopy;  Laterality: N/A;  . NEPHROLITHOTOMY Left 09/13/2015   Procedure: LEFT PERCUTANEOUS NEPHROLITHOTOMY ;  Surgeon: Franchot Gallo, MD;  Location: [facility]S;  Service: Urology;  Laterality: Left;  . NEPHROSTOMY TUBE REMOVAL  05/22/2020   Procedure: NEPHROSTOMY TUBE REMOVAL;  Surgeon: Franchot Gallo, MD;  Location: [facility] ORS;  Service: Urology;;  . REMOVAL OF STONES N/A 04/20/2020   Procedure: REMOVAL OF STONES;  Surgeon: Rogene Houston, MD;  Location: [facility];  Service: Endoscopy;  Laterality: N/A;  . SPHINCTEROTOMY  03/03/2020   Procedure: BILLARY SPHINCTEROTOMY;  Surgeon: Rogene Houston, MD;  Location: [facility];  Service: Endoscopy;;  . SUPRAPUBIC CATHETER INSERTION         Family History  Problem Relation Age of Onset  . Cirrhosis Brother        etoh  . Stroke Mother 38  . Coronary  artery disease Father 54  . Heart attack Brother   . Cancer Sister   . Multiple sclerosis Other   . Colon cancer Neg Hx     Social History   Tobacco Use  . Smoking status: Former Smoker    Packs/day: 1.00    Years: 25.00    Pack years: 25.00    Types: Cigarettes    Quit date: 03/03/1988    Years since quitting: 32.5  . Smokeless tobacco: Never Used  Vaping Use  . Vaping Use: Never used  Substance Use Topics  . Alcohol use: No  . Drug use: No    Home Medications Prior to Admission medications   Medication Sig Start Date End Date Taking? Authorizing Provider  acetaminophen (TYLENOL) 325 MG tablet Take 2 tablets (650 mg total) by mouth every 6 (six) hours as needed for mild pain or headache (fever >/= 101). 07/13/20   Allie Bossier, MD  ascorbic acid (VITAMIN C) 500 MG tablet Take 1 tablet (500 mg total) by mouth 2 (two) times daily. 07/13/20   Allie Bossier, MD  cephALEXin (KEFLEX) 250 MG capsule Take 1 capsule (250 mg total) by mouth at bedtime. 07/13/20   Allie Bossier, MD  diazepam (VALIUM) 5 MG tablet Take 1 tablet (5 mg total) by mouth every 12 (twelve) hours  as needed for muscle spasms. 08/29/20   Star Age, MD  guaiFENesin-dextromethorphan (ROBITUSSIN DM) 100-10 MG/5ML syrup Take 10 mLs by mouth every 4 (four) hours as needed for cough. 07/13/20   Allie Bossier, MD  HYDROcodone-acetaminophen (NORCO) 7.5-325 MG per tablet Take 1 tablet by mouth 2 (two) times daily. Max APAP 3 GM IN 24 HOURS FROM ALL SOURCES Patient taking differently: Take 1 tablet by mouth in the morning and at bedtime. ALL MEDICATIONS TO BE CRUSHED AND PLACED IN APPLESAUCE 08/22/14   Blanchie Serve, MD  Ipratropium-Albuterol (COMBIVENT) 20-100 MCG/ACT AERS respimat Inhale 1 puff into the lungs every 6 (six) hours as needed for wheezing. 07/13/20   Allie Bossier, MD  levothyroxine (SYNTHROID) 75 MCG tablet Take 75 mcg by mouth daily before breakfast. ALL MEDICATIONS TO BE CRUSHED AND PLACED IN APPLESAUCE 12/21/19   [provider]  linagliptin (TRADJENTA) 5 MG TABS tablet Take 1 tablet (5 mg total) by mouth daily. 07/14/20   Allie Bossier, MD  Maltodextrin-Xanthan Gum [facility] CLEAR) POWD Use as directed 07/13/20   Allie Bossier, MD  Multiple Vitamin (MULTIVITAMIN WITH MINERALS) TABS tablet Take 1 tablet by mouth daily. 07/14/20   Allie Bossier, MD    Allergies    Tetracyclines & related and Ciprofloxacin  Review of Systems   Review of Systems  Constitutional: Negative for fever.  Respiratory: Negative for shortness of breath.   Cardiovascular: Negative for chest pain.  Gastrointestinal: Negative for abdominal pain, nausea and vomiting.  Genitourinary: Positive for difficulty urinating and flank pain.  Musculoskeletal: Negative for back pain.  All other systems reviewed and are negative.   Physical Exam Updated Vital Signs BP 120/71 (BP Location: Left Arm)   Pulse (!) 109   Temp (!) 100.6 F (38.1 C) (Oral)   Resp 20   Ht 1.6 m (5\' 3" )   Wt 49 kg   SpO2 93%   BMI 19.13 kg/m   Physical Exam Vitals and nursing note reviewed.   Constitutional:      Appearance: He is well-developed.     Comments: Elderly, chronically ill-appearing, nontoxic  HENT:  Head: Normocephalic and atraumatic.     Mouth/Throat:     Mouth: Mucous membranes are dry.  Eyes:     Pupils: Pupils are equal, round, and reactive to light.  Cardiovascular:     Rate and Rhythm: Normal rate and regular rhythm.     Heart sounds: Normal heart sounds. No murmur heard.   Pulmonary:     Effort: Pulmonary effort is normal. No respiratory distress.     Breath sounds: Normal breath sounds. No wheezing.  Abdominal:     General: Bowel sounds are normal.     Palpations: Abdomen is soft.     Tenderness: There is no abdominal tenderness. There is left CVA tenderness. There is no right CVA tenderness or rebound.     Comments: Suprapubic catheter in place  Musculoskeletal:     Cervical back: Neck supple.     Right lower leg: No edema.     Left lower leg: No edema.  Lymphadenopathy:     Cervical: No cervical adenopathy.  Skin:    General: Skin is warm and dry.  Neurological:     Mental Status: He is alert and oriented to person, place, and time.  Psychiatric:        Mood and Affect: Mood normal.     ED Results / Procedures / Treatments   Labs (all labs ordered are listed, but only abnormal results are displayed) Labs Reviewed  CBC WITH DIFFERENTIAL/PLATELET - Abnormal; Notable for the following components:      Result Value   WBC 18.4 (*)    RBC 3.20 (*)    Hemoglobin 9.6 (*)    HCT 30.9 (*)    Neutro Abs 15.7 (*)    Abs Immature Granulocytes 0.10 (*)    All other components within normal limits  BASIC METABOLIC PANEL - Abnormal; Notable for the following components:   Sodium 134 (*)    Potassium 2.7 (*)    Glucose, Bld 144 (*)    BUN 39 (*)    Creatinine, Ser 3.39 (*)    GFR, Estimated 18 (*)    All other components within normal limits  URINE CULTURE  RESP PANEL BY RT-PCR (FLU A&B, COVID) ARPGX2  CULTURE, BLOOD (ROUTINE X 2)   CULTURE, BLOOD (ROUTINE X 2)  LACTIC ACID, PLASMA  URINALYSIS, ROUTINE W REFLEX MICROSCOPIC  LACTIC ACID, PLASMA  BASIC METABOLIC PANEL    EKG None  Radiology CT Renal Stone Study  Result Date: 09/05/2020 CLINICAL DATA:  Urinary retention. EXAM: CT ABDOMEN AND PELVIS WITHOUT CONTRAST TECHNIQUE: Multidetector CT imaging of the abdomen and pelvis was performed following the standard protocol without IV contrast. COMPARISON:  05/02/2020 FINDINGS: Lower chest: There are coarse reticular airspace opacities at the lung bases bilaterally with associated ground-glass opacification and bronchiectasis. This is new since the patient's CT dated 05/02/2020.The heart is enlarged. The intracardiac blood pool is hypodense relative to the adjacent myocardium consistent with anemia. Hepatobiliary: There is no discrete hepatic mass. The patient is status post prior cholecystectomy.Again noted is pneumobilia. There is significant intrahepatic and extrahepatic biliary ductal dilatation. Pancreas: Normal contours without ductal dilatation. No peripancreatic fluid collection. Spleen: The spleen appears to be borderline enlarged. Adrenals/Urinary Tract: --Adrenal glands: Unremarkable. --Right kidney/ureter: The right kidney is atrophic with multiple nonobstructing stones. --Left kidney/ureter: There is severe left-sided hydroureteronephrosis secondary to a 1 cm stone in the distal left ureter. Additional nonobstructing stones are noted in the left kidney --Urinary bladder: The urinary bladder is decompressed with a suprapubic  catheter in place multiple stones are noted within the decompressed urinary bladder. Stomach/Bowel: --Stomach/Duodenum: No hiatal hernia or other gastric abnormality. Normal duodenal course and caliber. --Small bowel: Unremarkable. --Colon: Unremarkable. --Appendix: Surgically absent. Vascular/Lymphatic: There are advanced vascular calcifications of the abdominal aorta, again with findings suspicious  for occlusion of the distal infrarenal abdominal aorta at the level of the aortic bifurcation. 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 --there is a left periaortic lymph node that is enlarged measuring approximately 1.2 cm in the short axis (axial series 2, image 29). There are additional smaller mildly enlarged retroperitoneal lymph nodes that are presumably reactive. --No mesenteric lymphadenopathy. --No pelvic or inguinal lymphadenopathy. Reproductive: The prostate gland is enlarged. Other: No ascites or free air. The abdominal [place] is normal. Musculoskeletal. 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 IMPRESSION: 1. Severe left-sided hydroureteronephrosis secondary to a 1 cm stone in the distal left ureter. 2. Bilateral nonobstructing nephrolithiasis.  Atrophic right kidney. 3. There are coarse reticular airspace opacities at the lung bases bilaterally with associated ground-glass opacification and bronchiectasis. This is new since the patient's CT dated 05/02/2020. Given the short interval development, findings are favored to be secondary to an infectious or inflammatory process such as viral pneumonia. 4. Intrahepatic and extrahepatic biliary ductal dilatation. Correlation with laboratory studies is recommended. 5. Anemia. Aortic Atherosclerosis (ICD10-I70.0). Electronically Signed   By: Constance Holster M.D.   On: 09/05/2020 03:16    Procedures .Critical Care Performed by: Merryl Hacker, MD Authorized by: Merryl Hacker, MD   Critical care provider statement:    Critical care time (minutes):  60   Critical care was necessary to treat or prevent imminent or life-threatening deterioration of the following conditions:  Metabolic crisis   Critical care was time spent personally by me on the following activities:  Discussions with consultants, evaluation of patient's response to treatment, examination of patient, ordering and performing  treatments and interventions, ordering and review of laboratory studies, ordering and review of radiographic studies, pulse oximetry, re-evaluation of patient's condition, obtaining history from patient or surrogate and review of old charts   (including critical care time)  Medications Ordered in ED Medications  potassium chloride 10 mEq in 100 mL IVPB (10 mEq Intravenous New Bag/Given 09/05/20 0537)  cefTRIAXone (ROCEPHIN) 1 g in sodium chloride 0.9 % 100 mL IVPB (has no administration in time range)  morphine 4 MG/ML injection 4 mg (4 mg Intravenous Given 09/05/20 0242)  ondansetron (ZOFRAN) injection 4 mg (4 mg Intravenous Given 09/05/20 0242)  sodium chloride 0.9 % bolus 1,000 mL (1,000 mLs Intravenous New Bag/Given 09/05/20 1517)    ED Course  I have reviewed the triage vital signs and the nursing notes.  Pertinent labs & imaging results that were available during my care of the patient were reviewed by me and considered in my medical decision making (see chart for details).  Clinical Course as of Sep 05 546  Wed Sep 05, 2020  6160 Spoke with Dr. Louis Meckel. Plans to take patient to the OR for stent placement.   [CH]    Clinical Course User Index [CH] Deboraha Goar, Barbette Hair, MD   MDM Rules/Calculators/A&P                          Patient presents with little to no urinary output from his suprapubic catheter. This was just exchanged yesterday. Also had a cystoscopy and removal of a left  ureteral stent. He is an uncomfortable appearing but nontoxic. Slightly tachycardic. No abdominal pain. I did replace his urinary catheter and feel confident that it is in place. He continues to have little to no output. He is complaining of left flank pain. Given history, will obtain a CT scan to evaluate for recurrent stone. Lab work notable for white count of 18. He is afebrile and nonseptic appearing. Basic metabolic panel with a creatinine of greater than three as well as a potassium of 2.7. This was  replaced with three rounds of IV potassium. He was also given a liter of fluids. He essentially has a solitary kidney on the left as his right kidney is fairly atrophic. For this reason, lack of urinary output and creatinine likely related to obstructive uropathy given CT findings of a 1 cm stone. Urology was consulted. Unfortunately, patient has had multiple antibiotics recently and does not have any urine for [place] to culture. At this time he does not appear septic but cannot fully assess for infected ureteral stone at this time. Dr. Louis Meckel to take patient to OR for treatment.    Of note, patient Covid positive on admission in early October. Repeat Covid testing was sent for admission purposes although this should not preclude or delay OR. Anesthesiologist requesting repeat potassium prior to going to the OR. This was ordered.  5:47 AM Patient now with temperature of 100.6.  Discussed with Dr. Louis Meckel.  Will give IV Rocephin preprocedurally.  His other vital signs remained stable.  Blood pressure 120/71 respiration 20, pulse 109.  Blood cultures were also added.  Again unfortunately unable to obtain urine sample for culture.   Final Clinical Impression(s) / ED Diagnoses Final diagnoses:  Obstructive uropathy  AKI (acute kidney injury) ([place])  Hypokalemia    Rx / DC Orders ED Discharge Orders    None       Caleigha Zale, Barbette Hair, MD 09/05/20 0540    Merryl Hacker, MD 09/05/20 [phone]

## 2020-09-05 NOTE — Op Note (Signed)
Preoperative diagnosis:  1. Obstructing left ureteral stone  2. Acute renal failure  Postoperative diagnosis:  1. same   Procedure:  1. Cystoscopy 2. left ureteral stent placement 3. left retrograde pyelography with interpretation   Surgeon: Ardis Hughs, MD  Anesthesia: General  Complications: None  Intraoperative findings:  left retrograde pyelography demonstrated a filling defect within the left ureter consistent with the patient's known calculus without other abnormalities.  EBL: Minimal  Specimens: None  Indication: Jordan Ward is a 79 y.o. patient with solitary left kidney and obstructing stone resulting in pain and acute renal failure. After reviewing the management options for treatment, he elected to proceed with the above surgical procedure(s). We have discussed the potential benefits and risks of the procedure, side effects of the proposed treatment, the likelihood of the patient achieving the goals of the procedure, and any potential problems that might occur during the procedure or recuperation. Informed consent has been obtained.  Description of procedure:  The patient was taken to the operating room and general anesthesia was induced.  The patient was placed in the dorsal lithotomy position, prepped and draped in the usual sterile fashion, and preoperative antibiotics were administered. A preoperative time-out was performed.   Cystourethroscopy was performed.  The patient's urethra was examined and was normal with demonstrated bilobar prostatic hypertrophy. The bladder was then systematically examined in its entirety. There was no evidence for any bladder tumors, stones, or other mucosal pathology.    Attention then turned to the leftureteral orifice and a ureteral catheter was used to intubate the ureteral orifice.  Omnipaque contrast was injected through the ureteral catheter and a retrograde pyelogram was performed with findings as dictated above.  A 0.38  sensor guidewire was then advanced up the left ureter into the renal pelvis under fluoroscopic guidance.  The wire was then backloaded through the cystoscope and a ureteral stent was advance over the wire using Seldinger technique.  The stent was positioned appropriately under fluoroscopic and cystoscopic guidance.  The wire was then removed with an adequate stent curl noted in the renal pelvis as well as in the bladder.  The bladder was then emptied and the procedure ended.  The patient appeared to tolerate the procedure well and without complications.  The patient was able to be awakened and transferred to the recovery unit in satisfactory condition.    Ardis Hughs, M.D.

## 2020-09-05 NOTE — ED Notes (Signed)
Date and time results received: 09/05/20 0300  Test: Potassium  Critical Value: 2.7  Name of Provider Notified: Thayer Jew, MD  Orders Received? Or Actions Taken?: None

## 2020-09-05 NOTE — Progress Notes (Signed)
Stent was placed in left ureter uneventfully.  Patient will f/u with Dr. [name].  Someone will contact him in a few days with appointment time/date.  Will follow PRN while in the hospital.

## 2020-09-05 NOTE — Anesthesia Preprocedure Evaluation (Addendum)
Anesthesia Evaluation  Patient identified by MRN, date of birth, ID band Patient awake    Reviewed: Allergy & Precautions, NPO status , Patient's Chart, lab work & pertinent test results, reviewed documented beta blocker date and time   History of Anesthesia Complications Negative for: history of anesthetic complications  Airway Mallampati: II  TM Distance: >3 FB Neck ROM: Full    Dental  (+) Poor Dentition, Dental Advisory Given, Missing   Pulmonary sleep apnea and Oxygen sleep apnea , pneumonia, former smoker,    Pulmonary exam normal breath sounds clear to auscultation       Cardiovascular Exercise Tolerance: Poor hypertension, Pt. on medications and Pt. on home beta blockers + CAD, + Past MI, + Peripheral Vascular Disease and +CHF  Normal cardiovascular exam+ dysrhythmias (PAT)  Rhythm:Regular Rate:Normal  Left ventricle: The cavity size was normal. Brahm thickness was increased in a pattern of mild LVH. Systolic function was normal.  The estimated ejection fraction was in the range of 50% to 55%.  There is hypokinesis of the basalinferior myocardium. Doppler parameters are consistent with abnormal left ventricular relaxation (grade 1 diastolic dysfunction).  - Aortic valve: A bicuspid morphology cannot be excluded. Moderate  thickening and calcification involving the noncoronary cusp.  Valve mobility was restricted. Mean gradient (S): 4 mm Hg. Peak  gradient (S): 10 mm Hg. VTI ratio of LVOT to aortic valve: 0.61.  Valve area (Vmax): 1.98 cm^2.  - Mitral valve: There was mild regurgitation.  - Left atrium: The atrium was mildly dilated.  - Right atrium: Central venous pressure (est): 3 mm Hg.  - Atrial septum: No defect or patent foramen ovale was identified.  - Tricuspid valve: There was mild regurgitation.  - Pulmonary arteries: PA peak pressure: 39 mm Hg (S).  - Pericardium, extracardiac: There was no  pericardial effusion.  22-Jul-2018 13:57:42 [facility]-AP-300 ROUTINE RECORD Sinus bradycardia Left ventricular hypertrophy with repolarization abnormality Abnormal ECG Confirmed by Sinda Du ([id number]) on 07/26/2018 12:31:27 PM 05-Sep-2020 03:43:35 [place]-AP-ER ROUTINE RECORD Sinus rhythm LVH with secondary repolarization abnormality Probable inferior infarct, age indeterminate Prolonged QT interval   Neuro/Psych  Headaches, PSYCHIATRIC DISORDERS TIA Neuromuscular disease CVA, Residual Symptoms    GI/Hepatic negative GI ROS, CBD stone, S/P ERCP   Endo/Other  Hypothyroidism   Renal/GU ARF and Renal InsufficiencyRenal disease     Musculoskeletal  (+) Arthritis ,   Abdominal   Peds  Hematology  (+) anemia ,   Anesthesia Other Findings 18-Apr-2020 13:03:26 [place] [facility]-AP-OPS ROUTINE RECORD Normal sinus rhythm Left ventricular hypertrophy with QRS widening and repolarization abnormality ( Sokolow-Lyon , Romhilt-Estes ) Cannot rule out Inferior infarct , age undetermined Abnormal ECG Confirmed by Sherren Mocha [phone]) on 04/19/2020 7:12:00 AM  Reproductive/Obstetrics                            Anesthesia Physical  Anesthesia Plan  ASA: IV and emergent  Anesthesia Plan: General   Post-op Pain Management:    Induction: Intravenous  PONV Risk Score and Plan: 3  Airway Management Planned: LMA  Additional Equipment:   Intra-op Plan:   Post-operative Plan: Extubation in OR  Informed Consent: I have reviewed the patients History and Physical, chart, labs and discussed the procedure including the risks, benefits and alternatives for the proposed anesthesia with the patient or authorized representative who has indicated his/her understanding and acceptance.     Dental advisory given  Plan Discussed with: Surgeon  Anesthesia Plan Comments:         Anesthesia Quick Evaluation

## 2020-09-05 NOTE — Transfer of Care (Signed)
Immediate Anesthesia Transfer of Care Note  Patient: Jordan Ward  Procedure(s) Performed: CYSTOSCOPY WITH RETROGRADE PYELOGRAM/URETERAL STENT PLACEMENT (N/A )  Patient Location: PACU  Anesthesia Type:General  Level of Consciousness: awake, alert , oriented and patient cooperative  Airway & Oxygen Therapy: Patient Spontanous Breathing and Patient connected to nasal cannula oxygen  Post-op Assessment: Report given to RN  Post vital signs: Reviewed and stable  Last Vitals:  Vitals Value Taken Time  BP 134/80 09/05/20 0747  Temp    Pulse 88 09/05/20 0754  Resp 15 09/05/20 0754  SpO2 88 % 09/05/20 0754  Vitals shown include unvalidated device data.  Last Pain:  Vitals:   09/05/20 0539  TempSrc: Oral  PainSc:          Complications: No complications documented.

## 2020-09-05 NOTE — ED Triage Notes (Signed)
Pt c/o urinary retention since this afternoon. Pt has suprapubic cath in place at this time but has only had very minimal output since having it changed by urology yesterday afternoon.

## 2020-09-05 NOTE — ED Notes (Signed)
Consent signed. Wife updated with plan of care. Pt changed in to hospital gown and being taken to the OR at this time. All belongings taken with patient. VSS.

## 2020-09-05 NOTE — ED Notes (Addendum)
Catheter flush was attempted. No urine returned.  Catheter was changed out by Dr. Dina Rich. New 24G catheter placed. No urine return.

## 2020-09-06 ENCOUNTER — Inpatient Hospital Stay (HOSPITAL_COMMUNITY): Payer: Medicare Other

## 2020-09-06 ENCOUNTER — Encounter (HOSPITAL_COMMUNITY): Payer: Self-pay | Admitting: Urology

## 2020-09-06 DIAGNOSIS — N139 Obstructive and reflux uropathy, unspecified: Secondary | ICD-10-CM

## 2020-09-06 DIAGNOSIS — R7881 Bacteremia: Secondary | ICD-10-CM

## 2020-09-06 DIAGNOSIS — I361 Nonrheumatic tricuspid (valve) insufficiency: Secondary | ICD-10-CM

## 2020-09-06 LAB — BLOOD CULTURE ID PANEL (REFLEXED) - BCID2
A.calcoaceticus-baumannii: NOT DETECTED
Bacteroides fragilis: NOT DETECTED
CTX-M ESBL: NOT DETECTED
Candida albicans: NOT DETECTED
Candida auris: NOT DETECTED
Candida glabrata: NOT DETECTED
Candida krusei: NOT DETECTED
Candida parapsilosis: NOT DETECTED
Candida tropicalis: NOT DETECTED
Carbapenem resist OXA 48 LIKE: NOT DETECTED
Carbapenem resistance IMP: NOT DETECTED
Carbapenem resistance KPC: NOT DETECTED
Carbapenem resistance NDM: NOT DETECTED
Carbapenem resistance VIM: NOT DETECTED
Cryptococcus neoformans/gattii: NOT DETECTED
Enterobacter cloacae complex: NOT DETECTED
Enterobacterales: DETECTED — AB
Enterococcus Faecium: NOT DETECTED
Enterococcus faecalis: DETECTED — AB
Escherichia coli: NOT DETECTED
Haemophilus influenzae: NOT DETECTED
Klebsiella aerogenes: NOT DETECTED
Klebsiella oxytoca: NOT DETECTED
Klebsiella pneumoniae: NOT DETECTED
Listeria monocytogenes: NOT DETECTED
Neisseria meningitidis: NOT DETECTED
Proteus species: NOT DETECTED
Pseudomonas aeruginosa: DETECTED — AB
Salmonella species: NOT DETECTED
Serratia marcescens: NOT DETECTED
Staphylococcus aureus (BCID): NOT DETECTED
Staphylococcus epidermidis: NOT DETECTED
Staphylococcus lugdunensis: NOT DETECTED
Staphylococcus species: NOT DETECTED
Stenotrophomonas maltophilia: NOT DETECTED
Streptococcus agalactiae: NOT DETECTED
Streptococcus pneumoniae: NOT DETECTED
Streptococcus pyogenes: NOT DETECTED
Streptococcus species: NOT DETECTED
Vancomycin resistance: NOT DETECTED

## 2020-09-06 LAB — BASIC METABOLIC PANEL
Anion gap: 10 (ref 5–15)
BUN: 42 mg/dL — ABNORMAL HIGH (ref 8–23)
CO2: 25 mmol/L (ref 22–32)
Calcium: 8.4 mg/dL — ABNORMAL LOW (ref 8.9–10.3)
Chloride: 105 mmol/L (ref 98–111)
Creatinine, Ser: 3.69 mg/dL — ABNORMAL HIGH (ref 0.61–1.24)
GFR, Estimated: 16 mL/min — ABNORMAL LOW (ref >60)
Glucose, Bld: 89 mg/dL (ref 70–99)
Potassium: 3.4 mmol/L — ABNORMAL LOW (ref 3.5–5.1)
Sodium: 140 mmol/L (ref 135–145)

## 2020-09-06 LAB — CBC
HCT: 28.1 % — ABNORMAL LOW (ref 39.0–52.0)
Hemoglobin: 8.5 g/dL — ABNORMAL LOW (ref 13.0–17.0)
MCH: 30 pg (ref 26.0–34.0)
MCHC: 30.2 g/dL (ref 30.0–36.0)
MCV: 99.3 fL (ref 80.0–100.0)
Platelets: 232 10*3/uL (ref 150–400)
RBC: 2.83 MIL/uL — ABNORMAL LOW (ref 4.22–5.81)
RDW: 15.2 % (ref 11.5–15.5)
WBC: 17.9 10*3/uL — ABNORMAL HIGH (ref 4.0–10.5)
nRBC: 0 % (ref 0.0–0.2)

## 2020-09-06 LAB — ECHOCARDIOGRAM COMPLETE
Area-P 1/2: 3.06 cm2
Height: 63 in
S' Lateral: 3.68 cm
Weight: 1727.98 oz

## 2020-09-06 MED ORDER — SODIUM CHLORIDE 0.9 % IV SOLN: 2.0000 g | INTRAVENOUS | Status: DC

## 2020-09-06 MED ORDER — SODIUM CHLORIDE 0.9 % IV SOLN: INTRAVENOUS | Status: AC

## 2020-09-06 MED ORDER — PIPERACILLIN-TAZOBACTAM 3.375 G IVPB
3.3750 g | Freq: Once | INTRAVENOUS | Status: AC
  Administered 2020-09-06: 3.375 g via INTRAVENOUS
  Filled 2020-09-06: qty 50

## 2020-09-06 MED ORDER — PIPERACILLIN-TAZOBACTAM 3.375 G IVPB
3.3750 g | Freq: Two times a day (BID) | INTRAVENOUS | Status: DC
  Administered 2020-09-06 – 2020-09-10 (×8): 3.375 g via INTRAVENOUS
  Filled 2020-09-06 (×8): qty 50

## 2020-09-06 NOTE — Consult Note (Signed)
Virtual ID/ASP e-consult   79 yo male with MS/neurogenic bladder, congenital atrophic right kidney, left renal stone, with suprapubic cath and 12/07 urologic procedure admitted 12/08 for severe sepsis with polymicrobial bacteremia including e faecalis and proteus species  recent procedure: 12/07 cystoscopy, double-J stent removal and suprapubic catheter exchange  12/6 ucx polymicrobial 12/8 bcx positive/sepsis admission; s/p left retrograde pyelogram with left ureteral stent placement 12/9 bcx repeat in progress  abx switched from ctrx to piptazo on 12/9    A/p Polymicrobial bacteremia in setting prior urologic procedure and urinary obstruction  Onset and admission within 24 hours of procedure  Low denova score at this time could defer tee Would consider tte though as this organism is a high risk IE organism and the host is also high risk  -review presence of old intravascular catheter/device or foreign hardware and if concern of being infected -tte -f/u repeat blood cx; if continues to be positive would get TEE  -review focal tenderness/areas of potential metastatic involvement and consider MRI/imaging as appropriate  -if tte negative and quick clinical defervescence and repeat bcx negative, would treat 2 weeks appropriate abx and 10 days for e faecalis and proteus respectively          -please ask micro lab to run sensitivity of e faecalis to linezolid as that could be used for the 2 week course of e faecalis          -proteus species treatment could be used with oral betalactam  -oral abx transition could occur at 48 hours of fever defervescence and sustained clinical improvement       -I have communicated via epic messaging with Dr Sloan Leiter regarding plan

## 2020-09-06 NOTE — Evaluation (Signed)
Physical Therapy Evaluation Patient Details Name: Jordan Ward MRN: [id number] DOB: 30-Jan-1941 Today's Date: 09/06/2020   History of Present Illness  79 year old gentleman with extensive medical comorbidities including coronary artery disease, multiple sclerosis and debility, paroxysmal A. fib, recent history of COVID with respiratory failure and now on oxygen, congenital atrophic right kidney, stone in the left kidney, neurogenic bladder with chronic suprapubic catheter admitted to the hospital secondary to sepsis.  Patient underwent cystoscopy, double-J stent removal and suprapubic catheter exchange on 12/7, very next day started having fever and left flank pain so came to the ER where he was found with fever, tachycardia, WC count of 18.4, creatinine 3.39.  He was treated with IV fluids and antibiotics and taken to the OR for left ureteric stent placement.09/06/2020.   Clinical Impression  Pt admitted with above diagnosis. Patient in recliner at beginning of session and agreeable to participating in PT evaluation today. Patient on 2 L O2 throughout session. Patient requires supervision to min guard for all upright mobility for safety. Patient able to ambulate 100 feet using RW; limited by fatigue. Left knee hyperextension noted in stance phase of gait over left lower extremity. Patient reported this is due to multiple sclerosis and weakness in his left thigh. Patient reports one fall in the last 6 months where left knee buckled. Patient agreeable to sitting in recliner at end of session. Pt currently with functional limitations due to the deficits listed below (see PT Problem List). Pt will benefit from skilled PT to increase their independence and safety with mobility to allow discharge to the venue listed below.       Follow Up Recommendations Home health PT;Supervision/Assistance - 24 hour;Supervision for mobility/OOB    Equipment Recommendations  None recommended by PT    Recommendations for  Other Services       Precautions / Restrictions Precautions Precautions: Fall Precaution Comments: one fall with left leg giving out on him; history of MS Restrictions Weight Bearing Restrictions: No      Mobility  Bed Mobility  General bed mobility comments: patient in recliner at beginning and end of session    Transfers Overall transfer level: Needs assistance Equipment used: Rolling walker (2 wheeled) Transfers: Sit to/from [facility] Sit to Stand: Supervision;Min guard Stand pivot transfers: Supervision;Min guard       General transfer comment: somewhat unsteady upon standing from recliner requiring RW for balance; use of bilateral upper extremities to stand  Ambulation/Gait Ambulation/Gait assistance: Min guard Gait Distance (Feet): 100 Feet Assistive device: Rolling walker (2 wheeled) Gait Pattern/deviations: Step-through pattern;Decreased step length - right;Decreased step length - left;Decreased stride length;Trunk flexed;Narrow base of support Gait velocity: decreased   General Gait Details: slow, labored gait with RW and 2 L O2; noted hyperextension of left knee in stance phase of gait over left due to left quad weakness and decreased control at the knee. Limited by fatigue.  Stairs   Wheelchair Mobility    Modified Rankin (Stroke Patients Only)       Balance Overall balance assessment: History of Falls;Needs assistance Sitting-balance support: Single extremity supported;Feet supported Sitting balance-Leahy Scale: Good     Standing balance support: Bilateral upper extremity supported;During functional activity Standing balance-Leahy Scale: Fair Standing balance comment: fair with RW         Pertinent Vitals/Pain Pain Assessment: 0-10 Pain Score: 4  Pain Location: left trunk due to kidney stone Pain Intervention(s): Limited activity within patient's tolerance;Monitored during session    Home Living Family/patient  expects to  be discharged to:: Private residence Living Arrangements: Spouse/significant other;Children (Daughter) Available Help at Discharge: Family (wife; daughter works) Type of Home: House Home Access: Stairs to enter Entrance Stairs-Rails: Right Entrance Stairs-Number of Steps: 2 [place]: One level [place]: [place] - 2 wheels;Grab bars - toilet;Bedside commode      Prior Function Level of Independence: Needs assistance   Gait / Transfers Assistance Needed: household distances with RW  ADL's / Homemaking Assistance Needed: 6 months ago would help with cook, clean, laundry; more recently independent with BADLs only        Hand Dominance   Dominant Hand: Left    Extremity/Trunk Assessment   Upper Extremity Assessment Upper Extremity Assessment: Generalized weakness    Lower Extremity Assessment Lower Extremity Assessment: Generalized weakness       Communication   Communication: No difficulties  Cognition Arousal/Alertness: Awake/alert Behavior During Therapy: WFL for tasks assessed/performed Overall Cognitive Status: Within Functional Limits for tasks assessed       General Comments      Exercises     Assessment/Plan    PT Assessment Patient needs continued PT services  PT Problem List Decreased strength;Decreased activity tolerance;Decreased balance;Decreased mobility       PT Treatment Interventions DME instruction;Balance training;Gait training;Stair training;Patient/family education;Therapeutic activities;Therapeutic exercise;Functional mobility training    PT Goals (Current goals can be found in the Care Plan section)  Acute Rehab PT Goals Patient Stated Goal: Go home and continue HHPT. PT Goal Formulation: With patient Time For Goal Achievement: 09/20/20 Potential to Achieve Goals: Fair    Frequency Min 3X/week   Barriers to discharge           AM-PAC PT "6 Clicks" Mobility  Outcome Measure Help needed turning from your back to your  side while in a flat bed without using bedrails?: A Little Help needed moving from lying on your back to sitting on the side of a flat bed without using bedrails?: A Little Help needed moving to and from a bed to a chair (including a wheelchair)?: A Little Help needed standing up from a chair using your arms (e.g., wheelchair or bedside chair)?: A Little Help needed to walk in hospital room?: A Little Help needed climbing 3-5 steps with a railing? : A Lot 6 Click Score: 17    End of Session Equipment Utilized During Treatment: Oxygen Activity Tolerance: Patient limited by fatigue;Patient tolerated treatment well Patient left: in chair;with call bell/phone within reach Nurse Communication: Mobility status PT Visit Diagnosis: Unsteadiness on feet (R26.81);Other abnormalities of gait and mobility (R26.89);History of falling (Z91.81);Muscle weakness (generalized) (M62.81)    Time: 7782-4235 PT Time Calculation (min) (ACUTE ONLY): 31 min   Charges:   PT Evaluation $PT Eval Moderate Complexity: 1 Mod PT Treatments $Therapeutic Activity: 8-22 mins        Floria Raveling. Hartnett-Rands, MS, PT Per [place] [phone] 09/06/2020, 1:25 PM

## 2020-09-06 NOTE — Progress Notes (Signed)
CRITICAL VALUE ALERT  Critical Value:  Gram pos cocci in chains in all 4 blood culture bottles  Date & Time Notied:  09/06/20 12:15 AM   Provider Notified: midlevel  Orders Received/Actions taken: none at this time

## 2020-09-06 NOTE — Progress Notes (Signed)
*  PRELIMINARY RESULTS* Echocardiogram 2D Echocardiogram has been performed.  Leavy Cella 09/06/2020, 3:52 PM

## 2020-09-06 NOTE — Progress Notes (Addendum)
Pharmacy Antibiotic Note  Jordan Ward is a 79 y.o. male admitted on 09/05/2020 with bacteremia.  Pharmacy has been consulted for Zosyn dosing. Acute on chronic renal failure.  Plan: Zosyn 3.375g IV every 12 hours. Monitor labs, c/s, and patient improvement  Height: 5\' 3"  (160 cm) Weight: 49 kg (108 lb) IBW/kg (Calculated) : 56.9  Temp (24hrs), Avg:98.1 F (36.7 C), Min:97.5 F (36.4 C), Max:98.9 F (37.2 C)  Recent Labs  Lab 09/05/20 0204 09/05/20 0534 09/05/20 0812 09/06/20 0429  WBC 18.4*  --  20.1* 17.9*  CREATININE 3.39* 3.54* 3.67* 3.69*  LATICACIDVEN 1.4 1.7  --   --     Estimated Creatinine Clearance: 11.6 mL/min (A) (by C-G formula based on SCr of 3.69 mg/dL (H)).    Allergies  Allergen Reactions  . Tetracyclines & Related Anaphylaxis and Rash  . Ciprofloxacin     Trouble swallowing unknown reaction according to wife     Antimicrobials this admission: Zosyn 12/9 >>  CTX 12/9 x 1   Microbiology results: 12/8 BCx: gram + cocci and gram - rods BCID: enterococcus faecalis, enterobacterales,  and pseudomonas 12/8 UCx: pending    Thank you for allowing pharmacy to be a part of this patient's care.  Ramond Craver 09/06/2020 8:06 AM

## 2020-09-06 NOTE — Evaluation (Signed)
Occupational Therapy Evaluation Patient Details Name: Jordan Ward MRN: [id number] DOB: 09/30/1940 Today's Date: 09/06/2020    History of Present Illness 79 year old gentleman with extensive medical comorbidities including coronary artery disease, multiple sclerosis and debility, paroxysmal A. fib, recent history of COVID with respiratory failure and now on oxygen, congenital atrophic right kidney, stone in the left kidney, neurogenic bladder with chronic suprapubic catheter admitted to the hospital secondary to sepsis.  Patient underwent cystoscopy, double-J stent removal and suprapubic catheter exchange on 12/7, very next day started having fever and left flank pain so came to the ER where he was found with fever, tachycardia, WC count of 18.4, creatinine 3.39.  He was treated with IV fluids and antibiotics and taken to the OR for left ureteric stent placement.09/06/2020   Clinical Impression   Pt agreeable to OT evaluation this am. Pt performing bed mobility with supervision, transfers with min guard. Pt able to perform basic ADLs with min guard for standing and with set-up for sitting tasks. Pt reports wife assists with ADLs as needed. Pt appears to be near baseline functioning, requiring increased time for tasks due to fatigue. Recommend resumption of HH OT services to assess pt functioning in home environment. No further acute OT services required at this time.     Follow Up Recommendations  Home health OT;Supervision/Assistance - 24 hour    Equipment Recommendations  None recommended by OT       Precautions / Restrictions Precautions Precautions: Fall Precaution Comments: one fall with left leg giving out on him; history of MS Restrictions Weight Bearing Restrictions: No      Mobility Bed Mobility Overal bed mobility: Needs Assistance Bed Mobility: Supine to Sit;Sit to Supine     Supine to sit: Supervision Sit to supine: Supervision   General bed mobility comments: patient in  recliner at beginning and end of session    Transfers Overall transfer level: Needs assistance Equipment used: 1 person hand held assist Transfers: Sit to/from Stand Sit to Stand: Min guard Stand pivot transfers: Supervision;Min guard       General transfer comment: somewhat unsteady upon standing from recliner requiring RW for balance; use of bilateral upper extremities to stand        ADL either performed or assessed with clinical judgement   ADL Overall ADL's : Needs assistance/impaired     Grooming: Wash/dry hands;Wash/dry face;Min guard;Standing                   Toilet Transfer: Min guard;BSC             General ADL Comments: Pt able to stand at bedside for sacral dressing change for approximately 3 minutes without difficulty     Vision Baseline Vision/History: No visual deficits Patient Visual Report: No change from baseline Vision Assessment?: No apparent visual deficits            Pertinent Vitals/Pain Pain Assessment: No/denies pain Pain Score: 4  Pain Location: left trunk due to kidney stone Pain Intervention(s): Limited activity within patient's tolerance;Monitored during session     Hand Dominance Left   Extremity/Trunk Assessment Upper Extremity Assessment Upper Extremity Assessment: Generalized weakness   Lower Extremity Assessment Lower Extremity Assessment: Defer to PT evaluation   Cervical / Trunk Assessment Cervical / Trunk Assessment: Normal   Communication Communication Communication: No difficulties   Cognition Arousal/Alertness: Awake/alert Behavior During Therapy: WFL for tasks assessed/performed Overall Cognitive Status: Within Functional Limits for tasks assessed  Home Living Family/patient expects to be discharged to:: Private residence Living Arrangements: Spouse/significant other;Children (daughter) Available Help at Discharge: Family (wife,  daughter works) Type of Home: House Home Access: Stairs to enter Technical brewer of Steps: 2 Entrance Stairs-Rails: Right [place]: One level;Laundry or work area in basement (pt does not go in basement)     Bathroom Shower/Tub: Teacher, early years/pre: Standard Bathroom Accessibility: No   Home Equipment: Environmental consultant - 2 wheels;Grab bars - toilet;Bedside commode          Prior Functioning/Environment Level of Independence: Needs assistance  Gait / Transfers Assistance Needed: household distances with RW ADL's / Homemaking Assistance Needed: 6 months ago would help with cook, clean, laundry; more recently independent with BADLs only. Pt sponges bathes at sink, does not get in tub.            OT Problem List: Decreased strength;Decreased activity tolerance;Impaired balance (sitting and/or standing);Decreased safety awareness;Decreased knowledge of use of DME or AE      OT Treatment/Interventions:      OT Goals(Current goals can be found in the care plan section) Acute Rehab OT Goals Patient Stated Goal: Go home and continue HHPT.  OT Frequency:      End of Session Equipment Utilized During Treatment: Oxygen  Activity Tolerance: Patient tolerated treatment well Patient left: in bed;with call bell/phone within reach;with bed alarm set  OT Visit Diagnosis: Muscle weakness (generalized) (M62.81);History of falling (Z91.81)                Time: 7989-2119 OT Time Calculation (min): 20 min Charges:  OT General Charges $OT Visit: 1 Visit OT Evaluation $OT Eval Low Complexity: [place], OTR/L  [phone] 09/06/2020, 2:47 PM

## 2020-09-06 NOTE — Plan of Care (Signed)
  Problem: Acute Rehab PT Goals(only PT should resolve) Goal: Pt Will Go Supine/Side To Sit Outcome: Progressing Flowsheets (Taken 09/06/2020 1330) Pt will go Supine/Side to Sit: with supervision Goal: Pt Will Go Sit To Supine/Side Outcome: Progressing Flowsheets (Taken 09/06/2020 1330) Pt will go Sit to Supine/Side: with supervision Goal: Patient Will Transfer Sit To/From Stand Outcome: Progressing Flowsheets (Taken 09/06/2020 1330) Patient will transfer sit to/from stand: with supervision Goal: Pt Will Transfer Bed To Chair/Chair To Bed Outcome: Progressing Flowsheets (Taken 09/06/2020 1330) Pt will Transfer Bed to Chair/Chair to Bed: with supervision Goal: Pt Will Ambulate Outcome: Progressing Flowsheets (Taken 09/06/2020 1330) Pt will Ambulate:  > 125 feet  with supervision  with least restrictive assistive device   Pamala Hurry D. Hartnett-Rands, MS, PT Per [place] [phone] 09/06/2020

## 2020-09-06 NOTE — Progress Notes (Signed)
PROGRESS NOTE    MACEN JOSLIN  AXK:[id number] DOB: May 08, 1941 DOA: 09/05/2020 PCP: Noreene Larsson, NP    Brief Narrative:  79 year old gentleman with extensive medical comorbidities including coronary artery disease, multiple sclerosis and debility, paroxysmal A. fib, recent history of COVID with respiratory failure and now on oxygen, congenital atrophic right kidney, stone in the left kidney, neurogenic bladder with chronic suprapubic catheter admitted to the hospital secondary to sepsis. Patient underwent cystoscopy, double-J stent removal and suprapubic catheter exchange on 12/7, very next day started having fever and left flank pain so came to the ER where he was found with fever, tachycardia, WC count of 18.4, creatinine 3.39.  He was treated with IV fluids and antibiotics and taken to the OR for left ureteric stent placement.  Assessment & Plan:   Active Problems:   Obstructive uropathy  Sepsis present on admission secondary to obstructive uropathy/infected kidney stone: Gram-positive cocci and gram-negative rod bacteremia. Status post left ureteric stent placement, urine output 1400 mL overnight.  Hemodynamically stabilizing. Cover for gram-positive cocci, possibly Enterococcus faecalis as well as Pseudomonas.  Was on Rocephin.  Changed to Zosyn.  Repeat cultures.  Urine cultures not collected.  Probable sources urine culture. If gram-positive bacteremia is true, will check 2D echocardiogram to rule out endocarditis. Postsurgical management as per urology.  Acute on chronic renal failure: Baseline stage IV chronic kidney disease, atrophic right kidney. Known baseline creatinine about 1.9-2. Likely from sepsis and obstruction. Urine output is adequate.  We will keep on IV fluids and maintenance fluid to continue.  Type 2 diabetes: On Tradjenta at home.  Well controlled.  Remains on sliding scale insulin.  Multiple sclerosis/neurogenic bladder: Start  mobility.  Hypokalemia/hypomagnesemia: Patient with poor renal function.  Will accept potassium of 3.4 and magnesium of 1.6 today.  We will recheck tomorrow.  If renal functions are stable, will replace tomorrow.  Hypothyroidism: On Synthroid that continued.  COPD: Stable.   DVT prophylaxis: heparin injection 5,000 Units Start: 09/05/20 0830   Code Status: Full code Family Communication: None at the bedside Disposition Plan: Status is: Inpatient  Remains inpatient appropriate because:Persistent severe electrolyte disturbances and Inpatient level of care appropriate due to severity of illness   Dispo: The patient is from: Home              Anticipated d/c is to: Home              Anticipated d/c date is: 3 days              Patient currently is not medically stable to d/c.         Consultants:   Urology  Procedures:   Cystoscopy, left ureteric stent placed  Antimicrobials:  Anti-infectives (From admission, onward)   Start     Dose/Rate Route Frequency Ordered Stop   09/07/20 0000  cefTRIAXone (ROCEPHIN) 2 g in sodium chloride 0.9 % 100 mL IVPB  Status:  Discontinued        2 g 200 mL/hr over 30 Minutes Intravenous Every 24 hours 09/06/20 0729 09/06/20 0730   09/06/20 1800  piperacillin-tazobactam (ZOSYN) IVPB 3.375 g        3.375 g 12.5 mL/hr over 240 Minutes Intravenous Every 12 hours 09/06/20 0805     09/06/20 0830  piperacillin-tazobactam (ZOSYN) IVPB 3.375 g        3.375 g 100 mL/hr over 30 Minutes Intravenous  Once 09/06/20 0757 09/06/20 0939   09/06/20 0730  cefTRIAXone (ROCEPHIN) 2  g in sodium chloride 0.9 % 100 mL IVPB  Status:  Discontinued        2 g 200 mL/hr over 30 Minutes Intravenous Every 24 hours 09/06/20 0730 09/06/20 0757   09/06/20 0000  cefTRIAXone (ROCEPHIN) 1 g in sodium chloride 0.9 % 100 mL IVPB  Status:  Discontinued        1 g 200 mL/hr over 30 Minutes Intravenous Every 24 hours 09/05/20 0815 09/06/20 0729   09/05/20 2200  cephALEXin  (KEFLEX) capsule 250 mg  Status:  Discontinued        250 mg Oral Daily at bedtime 09/05/20 0845 09/05/20 0852   09/05/20 0600  cefTRIAXone (ROCEPHIN) 1 g in sodium chloride 0.9 % 100 mL IVPB        1 g 200 mL/hr over 30 Minutes Intravenous  Once 09/05/20 0545 09/05/20 0650         Subjective: Patient seen and examined.  No overnight events.  Afebrile overnight.  Feels weak otherwise denies any other complaints.  He is not sure he can walk.  Flank pain has improved.  Objective: Vitals:   09/05/20 2243 09/05/20 2258 09/06/20 0316 09/06/20 0549  BP: 139/72 120/68 116/70 134/70  Pulse:  80 71 73  Resp:  20 16 16   Temp:  98.3 F (36.8 C) (!) 97.5 F (36.4 C) (!) 97.5 F (36.4 C)  TempSrc:  Oral    SpO2:  100% 100% 100%  Weight:      Height:        Intake/Output Summary (Last 24 hours) at 09/06/2020 1216 Last data filed at 09/06/2020 0325 Gross per 24 hour  Intake 1463.43 ml  Output 1400 ml  Net 63.43 ml   Filed Weights   09/05/20 0139  Weight: 49 kg    Examination:  General exam: Appears calm and comfortable  Chronically sick looking and frail gentleman lying in bed. Thin and frail, muscle wasting. Respiratory system: Clear to auscultation. Respiratory effort normal. Cardiovascular system: S1 & S2 heard, RRR. No pedal edema. Gastrointestinal system: Soft.  Nontender.  Bowel sounds present. Suprapubic catheter in place, draining clear urine. Central nervous system: Alert and oriented. No focal neurological deficits. Extremities: Generalized weakness. Skin: No rashes, lesions or ulcers Psychiatry: Judgement and insight appear normal. Mood & affect flat.    Data Reviewed: I have personally reviewed following labs and imaging studies  CBC: Recent Labs  Lab 09/05/20 0204 09/05/20 0812 09/06/20 0429  WBC 18.4* 20.1* 17.9*  NEUTROABS 15.7*  --   --   HGB 9.6* 8.8* 8.5*  HCT 30.9* 28.3* 28.1*  MCV 96.6 97.3 99.3  PLT 296 284 774   Basic Metabolic  Panel: Recent Labs  Lab 09/05/20 0204 09/05/20 0534 09/05/20 0812 09/06/20 0429  NA 134* 135  --  140  K 2.7* 3.3*  --  3.4*  CL 98 100  --  105  CO2 25 24  --  25  GLUCOSE 144* 154*  --  89  BUN 39* 40*  --  42*  CREATININE 3.39* 3.54* 3.67* 3.69*  CALCIUM 9.2 8.5*  --  8.4*  MG  --   --  1.6*  --    GFR: Estimated Creatinine Clearance: 11.6 mL/min (A) (by C-G formula based on SCr of 3.69 mg/dL (H)). Liver Function Tests: No results for input(s): AST, ALT, ALKPHOS, BILITOT, PROT, ALBUMIN in the last 168 hours. No results for input(s): LIPASE, AMYLASE in the last 168 hours. No results for input(s): AMMONIA  in the last 168 hours. Coagulation Profile: No results for input(s): INR, PROTIME in the last 168 hours. Cardiac Enzymes: No results for input(s): CKTOTAL, CKMB, CKMBINDEX, TROPONINI in the last 168 hours. BNP (last 3 results) No results for input(s): PROBNP in the last 8760 hours. HbA1C: No results for input(s): HGBA1C in the last 72 hours. CBG: No results for input(s): GLUCAP in the last 168 hours. Lipid Profile: No results for input(s): CHOL, HDL, LDLCALC, TRIG, CHOLHDL, LDLDIRECT in the last 72 hours. Thyroid Function Tests: No results for input(s): TSH, T4TOTAL, FREET4, T3FREE, THYROIDAB in the last 72 hours. Anemia Panel: No results for input(s): VITAMINB12, FOLATE, FERRITIN, TIBC, IRON, RETICCTPCT in the last 72 hours. Sepsis Labs: Recent Labs  Lab 09/05/20 0204 09/05/20 0534  LATICACIDVEN 1.4 1.7    Recent Results (from the past 240 hour(s))  Resp Panel by RT-PCR (Flu A&B, Covid) Nasopharyngeal Swab     Status: None   Collection Time: 09/05/20  5:16 AM   Specimen: Nasopharyngeal Swab; Nasopharyngeal(NP) swabs in vial transport medium  Result Value Ref Range Status   SARS Coronavirus 2 by RT PCR NEGATIVE NEGATIVE Final    Comment: (NOTE) SARS-CoV-2 target nucleic acids are NOT DETECTED.  The SARS-CoV-2 RNA is generally detectable in upper  respiratory specimens during the acute phase of infection. The lowest concentration of SARS-CoV-2 viral copies this assay can detect is 138 copies/mL. A negative result does not preclude SARS-Cov-2 infection and should not be used as the sole basis for treatment or other patient management decisions. A negative result may occur with  improper specimen collection/handling, submission of specimen other than nasopharyngeal swab, presence of viral mutation(s) within the areas targeted by this assay, and inadequate number of viral copies(<138 copies/mL). A negative result must be combined with clinical observations, patient history, and epidemiological information. The expected result is Negative.  Fact Sheet for Patients:  EntrepreneurPulse.com.au  Fact Sheet for Healthcare Providers:  IncredibleEmployment.be  This test is no t yet approved or cleared by the [place] FDA and  has been authorized for detection and/or diagnosis of SARS-CoV-2 by FDA under an Emergency Use Authorization (EUA). This EUA will remain  in effect (meaning this test can be used) for the duration of the COVID-19 declaration under Section 564(b)(1) of the Act, 21 U.S.C.section 360bbb-3(b)(1), unless the authorization is terminated  or revoked sooner.       Influenza A by PCR NEGATIVE NEGATIVE Final   Influenza B by PCR NEGATIVE NEGATIVE Final    Comment: (NOTE) The Xpert Xpress SARS-CoV-2/FLU/RSV plus assay is intended as an aid in the diagnosis of influenza from Nasopharyngeal swab specimens and should not be used as a sole basis for treatment. Nasal washings and aspirates are unacceptable for Xpert Xpress SARS-CoV-2/FLU/RSV testing.  Fact Sheet for Patients: EntrepreneurPulse.com.au  Fact Sheet for Healthcare Providers: IncredibleEmployment.be  This test is not yet approved or cleared by the [place] FDA and has been  authorized for detection and/or diagnosis of SARS-CoV-2 by FDA under an Emergency Use Authorization (EUA). This EUA will remain in effect (meaning this test can be used) for the duration of the COVID-19 declaration under Section 564(b)(1) of the Act, 21 U.S.C. section 360bbb-3(b)(1), unless the authorization is terminated or revoked.  Performed at [facility], [address], [place], [place] [address]   Blood culture (routine x 2)     Status: None (Preliminary result)   Collection Time: 09/05/20  6:10 AM   Specimen: BLOOD  Result Value Ref Range  Status   Specimen Description BLOOD LEFT WRIST  Final   Special Requests   Final    BOTTLES DRAWN AEROBIC AND ANAEROBIC Blood Culture adequate volume   Culture  Setup Time   Final    GRAM POSITIVE COCCI IN CHAINS GRAM NEGATIVE RODS AEROBIC AND ANAEROBIC BOTTLES Gram Stain Report Called to,Read Back By and Verified With: EVANS,H ON 09/05/20 AT 2340 BY LOY,C Performed at [facility]    Culture   Final    NO GROWTH 1 DAY Performed at [facility], [address] [place], [place] [address]    Report Status PENDING  Incomplete  Blood culture (routine x 2)     Status: None (Preliminary result)   Collection Time: 09/05/20  6:12 AM   Specimen: BLOOD  Result Value Ref Range Status   Specimen Description BLOOD RIGHT ANTECUBITAL  Final   Special Requests   Final    BOTTLES DRAWN AEROBIC AND ANAEROBIC Blood Culture adequate volume   Culture  Setup Time   Final    GRAM POSITIVE COCCI IN CHAINS GRAM NEGATIVE RODS BOTH ANAEROBIC AND AEROBIC BOTTLES Gram Stain Report Called to,Read Back By and Verified With: EVANS,H ON 09/05/20 AT 2340 BY LOY,C Performed at [place] ID to follow    Culture   Final    NO GROWTH 1 DAY Performed at [facility], [address], [place], [place] [address]    Report Status PENDING  Incomplete  Blood Culture ID Panel (Reflexed)     Status: Abnormal   Collection Time: 09/05/20  6:12 AM   Result Value Ref Range Status   Enterococcus faecalis DETECTED (A) NOT DETECTED Final    Comment: CRITICAL RESULT CALLED TO, READ BACK BY AND VERIFIED WITH: H EVANS RN 09/06/20 0432 JDW    Enterococcus Faecium NOT DETECTED NOT DETECTED Final   Listeria monocytogenes NOT DETECTED NOT DETECTED Final   Staphylococcus species NOT DETECTED NOT DETECTED Final   Staphylococcus aureus (BCID) NOT DETECTED NOT DETECTED Final   Staphylococcus epidermidis NOT DETECTED NOT DETECTED Final   Staphylococcus lugdunensis NOT DETECTED NOT DETECTED Final   Streptococcus species NOT DETECTED NOT DETECTED Final   Streptococcus agalactiae NOT DETECTED NOT DETECTED Final   Streptococcus pneumoniae NOT DETECTED NOT DETECTED Final   Streptococcus pyogenes NOT DETECTED NOT DETECTED Final   A.calcoaceticus-baumannii NOT DETECTED NOT DETECTED Final   Bacteroides fragilis NOT DETECTED NOT DETECTED Final   Enterobacterales DETECTED (A) NOT DETECTED Final    Comment: Enterobacterales represent a large order of gram negative bacteria, not a single organism. Refer to culture for further identification. CRITICAL RESULT CALLED TO, READ BACK BY AND VERIFIED WITH: H EVANS RN 09/06/20 0432 JDW    Enterobacter cloacae complex NOT DETECTED NOT DETECTED Final   Escherichia coli NOT DETECTED NOT DETECTED Final   Klebsiella aerogenes NOT DETECTED NOT DETECTED Final   Klebsiella oxytoca NOT DETECTED NOT DETECTED Final   Klebsiella pneumoniae NOT DETECTED NOT DETECTED Final   Proteus species NOT DETECTED NOT DETECTED Final   Salmonella species NOT DETECTED NOT DETECTED Final   Serratia marcescens NOT DETECTED NOT DETECTED Final   Haemophilus influenzae NOT DETECTED NOT DETECTED Final   Neisseria meningitidis NOT DETECTED NOT DETECTED Final   Pseudomonas aeruginosa DETECTED (A) NOT DETECTED Final    Comment: CRITICAL RESULT CALLED TO, READ BACK BY AND VERIFIED WITH: H EVANS RN 09/06/20 0432 JDW    Stenotrophomonas maltophilia  NOT DETECTED NOT DETECTED Final   Candida albicans  NOT DETECTED NOT DETECTED Final   Candida auris NOT DETECTED NOT DETECTED Final   Candida glabrata NOT DETECTED NOT DETECTED Final   Candida krusei NOT DETECTED NOT DETECTED Final   Candida parapsilosis NOT DETECTED NOT DETECTED Final   Candida tropicalis NOT DETECTED NOT DETECTED Final   Cryptococcus neoformans/gattii NOT DETECTED NOT DETECTED Final   CTX-M ESBL NOT DETECTED NOT DETECTED Final   Carbapenem resistance IMP NOT DETECTED NOT DETECTED Final   Carbapenem resistance KPC NOT DETECTED NOT DETECTED Final   Carbapenem resistance NDM NOT DETECTED NOT DETECTED Final   Carbapenem resist OXA 48 LIKE NOT DETECTED NOT DETECTED Final   Vancomycin resistance NOT DETECTED NOT DETECTED Final   Carbapenem resistance VIM NOT DETECTED NOT DETECTED Final    Comment: Performed at [facility] [address], [place], [place] [address]  Culture, blood (routine x 2)     Status: None (Preliminary result)   Collection Time: 09/06/20  5:30 AM   Specimen: BLOOD  Result Value Ref Range Status   Specimen Description BLOOD  Final   Special Requests NONE  Final   Culture   Final    NO GROWTH <12 HOURS Performed at [facility], [address] [place], [place] [address]    Report Status PENDING  Incomplete  Culture, blood (routine x 2)     Status: None (Preliminary result)   Collection Time: 09/06/20  5:40 AM   Specimen: BLOOD  Result Value Ref Range Status   Specimen Description BLOOD  Final   Special Requests NONE  Final   Culture   Final    NO GROWTH <12 HOURS Performed at [facility], [address], [place], [place] [address]    Report Status PENDING  Incomplete         Radiology Studies: DG Retrograde Pyelogram  Result Date: 09/05/2020 CLINICAL DATA:  Left hydronephrosis.  Left ureter stent placement. EXAM: RETROGRADE PYELOGRAM COMPARISON:  CT 09/05/2020 FINDINGS: Intraoperative images demonstrate placement of a left double-J  ureter stent. Proximal and distal aspect of the stent appear to be in the expected locations. IMPRESSION: Placement of left ureter stent. Electronically Signed   By: Markus Daft M.D.   On: 09/05/2020 08:43   CT Renal Stone Study  Result Date: 09/05/2020 CLINICAL DATA:  Urinary retention. EXAM: CT ABDOMEN AND PELVIS WITHOUT CONTRAST TECHNIQUE: Multidetector CT imaging of the abdomen and pelvis was performed following the standard protocol without IV contrast. COMPARISON:  05/02/2020 FINDINGS: Lower chest: There are coarse reticular airspace opacities at the lung bases bilaterally with associated ground-glass opacification and bronchiectasis. This is new since the patient's CT dated 05/02/2020.The heart is enlarged. The intracardiac blood pool is hypodense relative to the adjacent myocardium consistent with anemia. Hepatobiliary: There is no discrete hepatic mass. The patient is status post prior cholecystectomy.Again noted is pneumobilia. There is significant intrahepatic and extrahepatic biliary ductal dilatation. Pancreas: Normal contours without ductal dilatation. No peripancreatic fluid collection. Spleen: The spleen appears to be borderline enlarged. Adrenals/Urinary Tract: --Adrenal glands: Unremarkable. --Right kidney/ureter: The right kidney is atrophic with multiple nonobstructing stones. --Left kidney/ureter: There is severe left-sided hydroureteronephrosis secondary to a 1 cm stone in the distal left ureter. Additional nonobstructing stones are noted in the left kidney --Urinary bladder: The urinary bladder is decompressed with a suprapubic catheter in place multiple stones are noted within the decompressed urinary bladder. Stomach/Bowel: --Stomach/Duodenum: No hiatal hernia or other gastric abnormality. Normal duodenal course and caliber. --Small bowel: Unremarkable. --Colon: Unremarkable. --Appendix:  Surgically absent. Vascular/Lymphatic: There are advanced vascular calcifications of the abdominal  aorta, again with findings suspicious for occlusion of the distal infrarenal abdominal aorta at the level of the aortic bifurcation. 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 --there is a left periaortic lymph node that is enlarged measuring approximately 1.2 cm in the short axis (axial series 2, image 29). There are additional smaller mildly enlarged retroperitoneal lymph nodes that are presumably reactive. --No mesenteric lymphadenopathy. --No pelvic or inguinal lymphadenopathy. Reproductive: The prostate gland is enlarged. Other: No ascites or free air. The abdominal Lynam is normal. Musculoskeletal. 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 IMPRESSION: 1. Severe left-sided hydroureteronephrosis secondary to a 1 cm stone in the distal left ureter. 2. Bilateral nonobstructing nephrolithiasis.  Atrophic right kidney. 3. There are coarse reticular airspace opacities at the lung bases bilaterally with associated ground-glass opacification and bronchiectasis. This is new since the patient's CT dated 05/02/2020. Given the short interval development, findings are favored to be secondary to an infectious or inflammatory process such as viral pneumonia. 4. Intrahepatic and extrahepatic biliary ductal dilatation. Correlation with laboratory studies is recommended. 5. Anemia. Aortic Atherosclerosis (ICD10-I70.0). Electronically Signed   By: Constance Holster M.D.   On: 09/05/2020 03:16        Scheduled Meds: . ascorbic acid  500 mg Oral BID  . heparin  5,000 Units Subcutaneous Q8H  . HYDROcodone-acetaminophen  1 tablet Oral BID  . levothyroxine  75 mcg Oral QAC breakfast  . linagliptin  5 mg Oral Daily  .  morphine injection  4 mg Intravenous Once  . multivitamin with minerals  1 tablet Oral Daily   Continuous Infusions: . sodium chloride    . piperacillin-tazobactam (ZOSYN)  IV       LOS: 1 day    Time spent: 35 minutes    Barb Merino, MD [facility] Pager [phone]

## 2020-09-06 NOTE — TOC Initial Note (Signed)
Transition of Care [facility]) - Initial/Assessment Note   Patient Details  Name: Jordan Ward MRN: [id number] Date of Birth: 12/22/1940  Transition of Care [facility]) CM/SW Contact:    Sherie Don, LCSW Phone Number: 09/06/2020, 1:31 PM  Clinical Narrative: Patient is a 79 year old male was admitted for obstructive uropathy. Readmission checklist completed due to high readmission score. CSW completed assessment with patient's wife, Shion Bluestein. Per wife, patient resides at home with her. Patient is currently active with [facility] for HH (RN/OT/PT/ST). Patient's current DME includes a walker and O2. Patient gets his O2 from [place] and is on 2L/min. Wife reported she provides patient's transportation and he is able to afford his medications each month. Patient will need HH resumption orders at discharge. TOC to follow.  Expected Discharge Plan: [place] Barriers to Discharge: Continued Medical Work up  Patient Goals and CMS Choice Patient states their goals for this hospitalization and ongoing recovery are:: Return home with [facility] CMS Medicare.gov Compare Post Acute Care list provided to:: Patient Represenative (must comment) Rejeana Brock (wife)) Choice offered to / list presented to : Spouse  Expected Discharge Plan and Services Expected Discharge Plan: [place] In-house Referral: Clinical Social Work Discharge Planning Services: NA Post Acute Care Choice: Home Health Living arrangements for the past 2 months: Single Family Home             DME Arranged: N/A DME Agency: NA HH Arranged: PT,RN,OT,Speech Therapy HH Agency: Customer service manager)  Prior Living Arrangements/Services Living arrangements for the past 2 months: Single Family Home Lives with:: Spouse Patient language and need for interpreter reviewed:: Yes Do you feel safe going back to the place where you live?: Yes      Need for Family Participation in Patient Care: Yes  (Comment) Care giver support system in place?: Yes (comment) Current home services: DME,Home PT,Home RN,Home OT (Walker, O2) Criminal Activity/Legal Involvement Pertinent to Current Situation/Hospitalization: No - Comment as needed  Activities of Daily Living Home Assistive Devices/Equipment: Environmental consultant (specify type) ADL Screening (condition at time of admission) Patient's cognitive ability adequate to safely complete daily activities?: Yes Is the patient deaf or have difficulty hearing?: No Does the patient have difficulty seeing, even when wearing glasses/contacts?: No Does the patient have difficulty concentrating, remembering, or making decisions?: No Patient able to express need for assistance with ADLs?: Yes Does the patient have difficulty dressing or bathing?: Yes Independently performs ADLs?: Yes (appropriate for developmental age) Communication: Needs assistance Is this a change from baseline?: Pre-admission baseline Dressing (OT): Needs assistance Is this a change from baseline?: Pre-admission baseline Grooming: Independent Feeding: Independent Bathing: Independent Toileting: Needs assistance Is this a change from baseline?: Pre-admission baseline In/Out Bed: Needs assistance Is this a change from baseline?: Pre-admission baseline Walks in Home: Independent with device (comment) Does the patient have difficulty walking or climbing stairs?: Yes Weakness of Legs: Both Weakness of Arms/Hands: Both  Emotional Assessment Appearance:: Appears stated age Orientation: : Oriented to Self,Oriented to Place,Oriented to  Time,Oriented to Situation Alcohol / Substance Use: Not Applicable Psych Involvement: No (comment)  Admission diagnosis:  Hypokalemia [E87.6] Obstructive uropathy [N13.9] AKI (acute kidney injury) ([place]) [N17.9] Patient Active Problem List   Diagnosis Date Noted  . Acute respiratory failure with hypoxia ([place]) 07/12/2020  . Pneumonia due to COVID-19 virus  07/12/2020  . Encephalopathy due to COVID-19 virus 07/12/2020  . Acute hypoxemic respiratory failure due to COVID-19 ([place]) 07/03/2020  .  Calculus of ureter 05/15/2020  . Bacteremia due to Gram-negative bacteria 05/04/2020  . Acute lower UTI 05/04/2020  . Acute unilateral obstructive uropathy   . SIRS (systemic inflammatory response syndrome) ([place]) 05/03/2020  . Hydronephrosis 05/02/2020  . Complication of cystostomy catheter, subsequent encounter 04/20/2020  . Acute kidney injury superimposed on CKD ([place]) 03/02/2020  . Dehydration 03/02/2020  . Nausea and vomiting 03/02/2020  . Ventral hernia without obstruction or gangrene 03/01/2020  . Hypotension 03/01/2020  . Poor appetite 03/01/2020  . Urinary tract infection 03/01/2020  . Detrusor areflexia 09/16/2019  . Cerebrovascular disease 08/17/2018  . Partial small bowel obstruction ([place]) 07/11/2017  . Choledocholithiasis 07/11/2017  . Complicated UTI (urinary tract infection)   . Sepsis secondary to UTI ([place]) 06/14/2017  . Paroxysmal atrial tachycardia ([place]) 06/14/2017  . Hypophosphatemia 06/14/2017  . Pressure injury of skin 05/26/2017  . Hydronephrosis due to obstruction of ureter 05/26/2017  . Obstructive uropathy 05/25/2017  . CKD (chronic kidney disease), stage III ([place]) 08/23/2016  . Hydronephrosis of left kidney 08/23/2016  . Kidney stones 09/13/2015  . Essential hypertension   . Pressure ulcer 08/04/2015  . Chronic diastolic (congestive) heart failure ([place]) 08/04/2015  . Hydronephrosis with obstructing calculus 08/04/2015  . Occult blood positive stool 09/05/2014  . Rash and nonspecific skin eruption 08/25/2014  . Edema 08/19/2014  . Subacute confusional state 08/06/2014  . Dilated cardiomyopathy ([place]) 07/21/2014  . Sepsis ([place]) 07/17/2014  . Severe sepsis ([place]) 07/17/2014  . Headache 03/13/2014  . CVA (cerebral infarction) 03/13/2014  . Abnormality of gait 03/13/2014  . Tremors of nervous system 10/08/2011  .  Hypothyroidism 10/08/2011  . Pulmonary nodule 10/08/2011  . Dysphagia 10/07/2011  . HTN (hypertension), malignant 10/06/2011  . Chronic suprapubic catheter ([place]) 10/06/2011  . Pulmonary fibrosis ([place]) 10/06/2011  . UTI (urinary tract infection) 10/03/2011  . PNA (pneumonia) 10/02/2011  . Junctional rhythm 10/02/2011  . Multiple sclerosis ([place]) 10/02/2011  . Chronic diarrhea 06/17/2011  . Hx of adenomatous colonic polyps 06/17/2011  . High grade dysplasia in colonic adenoma 09/29/2005   PCP:  Noreene Larsson, NP Pharmacy:   [facility], [place] [address] Phone: [phone] Fax: [phone]  Readmission Risk Interventions Readmission Risk Prevention Plan 09/06/2020  Transportation Screening Complete  HRI or Home Care Consult Complete  Social Work Consult for [place] Planning/Counseling Complete  Palliative Care Screening Not Applicable  Medication Review Press photographer) Complete  Some recent data might be hidden

## 2020-09-06 NOTE — Progress Notes (Signed)
CRITICAL VALUE ALERT  Critical Value:  Gram  Neg rods x 4 blood cultures  Date & Time Notied:  09/06/20 4:29 AM   Provider Notified: Hosp  Orders Received/Actions taken: repeat blood cultures

## 2020-09-07 LAB — CBC WITH DIFFERENTIAL/PLATELET
Abs Immature Granulocytes: 0.09 10*3/uL — ABNORMAL HIGH (ref 0.00–0.07)
Basophils Absolute: 0 10*3/uL (ref 0.0–0.1)
Basophils Relative: 0 %
Eosinophils Absolute: 0.2 10*3/uL (ref 0.0–0.5)
Eosinophils Relative: 1 %
HCT: 23.8 % — ABNORMAL LOW (ref 39.0–52.0)
Hemoglobin: 7.2 g/dL — ABNORMAL LOW (ref 13.0–17.0)
Immature Granulocytes: 1 %
Lymphocytes Relative: 17 %
Lymphs Abs: 1.9 10*3/uL (ref 0.7–4.0)
MCH: 29.9 pg (ref 26.0–34.0)
MCHC: 30.3 g/dL (ref 30.0–36.0)
MCV: 98.8 fL (ref 80.0–100.0)
Monocytes Absolute: 0.7 10*3/uL (ref 0.1–1.0)
Monocytes Relative: 6 %
Neutro Abs: 8.3 10*3/uL — ABNORMAL HIGH (ref 1.7–7.7)
Neutrophils Relative %: 75 %
Platelets: 203 10*3/uL (ref 150–400)
RBC: 2.41 MIL/uL — ABNORMAL LOW (ref 4.22–5.81)
RDW: 15.3 % (ref 11.5–15.5)
WBC: 11.2 10*3/uL — ABNORMAL HIGH (ref 4.0–10.5)
nRBC: 0 % (ref 0.0–0.2)

## 2020-09-07 LAB — PHOSPHORUS: Phosphorus: 4.5 mg/dL (ref 2.5–4.6)

## 2020-09-07 LAB — BASIC METABOLIC PANEL
Anion gap: 10 (ref 5–15)
BUN: 43 mg/dL — ABNORMAL HIGH (ref 8–23)
CO2: 26 mmol/L (ref 22–32)
Calcium: 7.9 mg/dL — ABNORMAL LOW (ref 8.9–10.3)
Chloride: 102 mmol/L (ref 98–111)
Creatinine, Ser: 3.7 mg/dL — ABNORMAL HIGH (ref 0.61–1.24)
GFR, Estimated: 16 mL/min — ABNORMAL LOW (ref >60)
Glucose, Bld: 97 mg/dL (ref 70–99)
Potassium: 3.3 mmol/L — ABNORMAL LOW (ref 3.5–5.1)
Sodium: 138 mmol/L (ref 135–145)

## 2020-09-07 LAB — MAGNESIUM: Magnesium: 1.7 mg/dL (ref 1.7–2.4)

## 2020-09-07 MED ORDER — SODIUM CHLORIDE 0.9 % IV SOLN
INTRAVENOUS | Status: DC | PRN
  Administered 2020-09-07: 1000 mL via INTRAVENOUS

## 2020-09-07 MED ORDER — POTASSIUM CHLORIDE CRYS ER 20 MEQ PO TBCR
40.0000 meq | EXTENDED_RELEASE_TABLET | Freq: Two times a day (BID) | ORAL | Status: AC
  Administered 2020-09-07 (×2): 40 meq via ORAL
  Filled 2020-09-07 (×2): qty 2

## 2020-09-07 MED ORDER — MAGNESIUM SULFATE 2 GM/50ML IV SOLN
2.0000 g | Freq: Once | INTRAVENOUS | Status: AC
  Administered 2020-09-07: 2 g via INTRAVENOUS
  Filled 2020-09-07: qty 50

## 2020-09-07 NOTE — Care Management Important Message (Signed)
Important Message  Patient Details  Name: Jordan Ward MRN: [id number] Date of Birth: [birth year]   Medicare Important Message Given:  Yes     [name] 09/07/2020, 4:00 PM

## 2020-09-07 NOTE — Consult Note (Signed)
Jordan Ward Admit Date: 09/05/2020 09/07/2020 Rexene Agent Requesting Physician:  Raelyn Mora MD  Reason for Consult:  AoCKD3 HPI:  20M recent complex medical course including PMH of multiple sclerosis, neurogenic bladder with suprapubic catheter, diastolic heart failure, history of CVA, CAD, paroxysmal atrial fibrillation.  Patient was admitted to [facility] long hospital 10/5 through 07/13/2020 with acute respiratory failure for ZYSAY-30, complicated by AKI on CKD 3 and persistent encephalopathy eventually requiring placement at [facility].  Complex urological history.  He has an atrophic right kidney and in August had AN obstructing left ureteral stone requiring PCN and eventual placement of ureteral stent.  On 12/7 he underwent stent removal after receiving Keflex and gentamicin as prophylactic perioperative antibiotics.  Within an hour or so he developed fevers and flank pain and presented back to the emergency room where he was found to have a new obstructing left-sided ureteral stone, acute on chronic renal insufficiency, and high-grade fevers.  Culture data has revealed polymicrobial bacteremia including enterococcal faecalis and pseudomonas currently on Zosyn.  He underwent stent placement to relieve obstruction upon admission.  His baseline creatinine has been around 1.4-1.6 and his presenting creatinine was 3.4, worsened to 3.7 over the past 3 days.  3.2 L of urine output yesterday.  Recent labs showed K of 3.3 and HCO3 of 26, BUN is 43.  He has defervesced.  He is receiving normal saline at 75 mL/h.  Foley catheter is working properly, suprapubic location   Creatinine, Ser (mg/dL)  Date Value  09/07/2020 3.70 (H)  09/06/2020 3.69 (H)  09/05/2020 3.67 (H)  09/05/2020 3.54 (H)  09/05/2020 3.39 (H)  07/12/2020 1.64 (H)  07/11/2020 1.60 (H)  07/10/2020 1.40 (H)  07/09/2020 1.29 (H)  07/08/2020 1.48 (H)  ] I/Os: I/O last 3 completed shifts: In: 1409.8 [I.V.:1208.4; IV  Piggyback:201.3] Out: 4000 [Urine:4000]   ROS Balance of 12 systems is negative w/ exceptions as above  PMH  Past Medical History:  Diagnosis Date  . Anemia   . Arthritis   . Back pain, chronic   . Bilateral carotid bruits   . C. difficile colitis 09/2011  . CAD (coronary artery disease)   . Carotid artery stenosis   . Cerebrovascular disease   . Cerebrovascular disease 08/17/2018  . Colon polyps   . Complication of cystostomy catheter, initial encounter ([place]) 04/20/2020  . Dyslipidemia   . Dysphagia 10/07/2011  . Encephalopathy   . Gait disorder   . HA (headache)   . High grade dysplasia in colonic adenoma 09/2005  . History of kidney stones   . HTN (hypertension), malignant 10/06/2011  . Hypernatremia   . Hypokalemia   . Hypothyroidism 10/08/2011  . Insomnia   . Junctional rhythm   . Kidney stones   . MS (multiple sclerosis) ([place])   . Neuromuscular disorder (HCC)    MS  . OSA (obstructive sleep apnea)   . Paroxysmal atrial tachycardia ([place])   . Peripheral vascular disease ([place])   . Pneumonia 4 yrs ago  . Pulmonary fibrosis ([place]) 10/06/2011  . Pulmonary nodule 10/08/2011  . PVD (peripheral vascular disease) ([place])   . Sacral ulcer ([place])   . Sleep apnea    cannot tolerate  . Stroke [facility])    left sided weakness  . Suprapubic catheter ([place])   . TIA (transient ischemic attack)   . Tremors of nervous system 10/08/2011  . Urinary tract infection   . Ventral hernia without obstruction or gangrene     Large  8X9cm ventral hernia with loss of domain. CT reads report as diastasis recti with herniation or diastasis recti.  Dr. Constance Haw, Surgery, reviewed CT with radiology and there is herniation with only hernia sac or peritoneum over the bowel and large separation of the rectus muslce (i.e. diastasis recti aka loss of domain).  No surgical intervention recommended given size, age, and health.    PSH  Past Surgical History:  Procedure Laterality Date  . APPENDECTOMY  09/2005   at time  of left hemicolectomy  . BACK SURGERY  1976/1979   lower  . BILIARY DILATION N/A 03/03/2020   Procedure: BILIARY DILATION;  Surgeon: Rogene Houston, MD;  Location[facility];  Service: Endoscopy;  Laterality: N/A;  . CATARACT EXTRACTION W/PHACO Right 03/08/2018   Procedure: CATARACT EXTRACTION PHACO AND INTRAOCULAR LENS PLACEMENT RIGHT EYE;  Surgeon: Tonny Branch, MD;  Location: [facility] ORS;  Service: Ophthalmology;  Laterality: Right;  CDE: 8.86  . CATARACT EXTRACTION W/PHACO Left 04/05/2018   Procedure: CATARACT EXTRACTION PHACO AND INTRAOCULAR LENS PLACEMENT (IOC);  Surgeon: Tonny Branch, MD;  Location: [facility] ORS;  Service: Ophthalmology;  Laterality: Left;  CDE: 7.36  . CHOLECYSTECTOMY     Dr. Tamala Julian  . COLON SURGERY  09/2005   Fleishman: four tubular adenomas, large adenomatous polyp with HIGH GRADE dysplasia  . COLONOSCOPY  11/2004   Dr. Sharol Roussel sessile polyp splenic flexure, 57mm sessile polyp desc colon, tubulovillous adenoma (bx not removed)  . COLONOSCOPY  01/2005   poor prep, polyp could not be found  . COLONOSCOPY  05/2005   with EMR, polypectomy Dr. Olegario Messier, bx showed high grade dysplasia, partially resected  . COLONOSCOPY  09/2005   Dr. Arsenio Loader, [place] ink tattooing, four villous colon polyp (3 had been missed on previous colonoscopies due to limitations of procedures  . COLONOSCOPY  09/2006   normal TI, no polyps  . COLONOSCOPY  10/2007   Dr. Imogene Burn distal mammillations, benign bx, normal TI, random bx neg for microscopic colitis  . CYSTOSCOPY W/ URETERAL STENT PLACEMENT Left 09/05/2020   Procedure: CYSTOSCOPY WITH RETROGRADE PYELOGRAM/URETERAL STENT PLACEMENT;  Surgeon: Ardis Hughs, MD;  Location: [facility];  Service: Urology;  Laterality: Left;  . CYSTOSCOPY WITH LITHOLAPAXY N/A 07/27/2018   Procedure: CYSTOSCOPY WITH LITHOLAPAXY VIA  SUPRAPUBIC TUBE;  Surgeon: Franchot Gallo, MD;  Location: [facility]S;  Service: Urology;  Laterality: N/A;  . CYSTOSCOPY WITH  RETROGRADE PYELOGRAM, URETEROSCOPY AND STENT PLACEMENT Left 06/09/2017   Procedure: CYSTOSCOPY WITH LEFT RETROGRADE PYELOGRAM, LEFT URETEROSCOPY, LEFT URETEROSCOPIC STONE EXTRACTION, LEFT URETERAL STENT PLACEMENT;  Surgeon: Franchot Gallo, MD;  Location: [facility];  Service: Urology;  Laterality: Left;  . CYSTOSCOPY/URETEROSCOPY/HOLMIUM LASER/STENT PLACEMENT Left 05/22/2020   Procedure: CYSTOSCOPY/URETEROSCOPY/STENT PLACEMENT;  Surgeon: Franchot Gallo, MD;  Location: [facility];  Service: Urology;  Laterality: Left;  . ERCP N/A 03/03/2020   Procedure: ENDOSCOPIC RETROGRADE CHOLANGIOPANCREATOGRAPHY (ERCP);  Surgeon: Rogene Houston, MD;  [facility];  Service: Endoscopy;  Laterality: N/A;  . ERCP N/A 04/20/2020   Procedure: ENDOSCOPIC RETROGRADE CHOLANGIOPANCREATOGRAPHY (ERCP);  Surgeon: Rogene Houston, MD;  [facility];  Service: Endoscopy;  Laterality: N/A;  to be done at 7:30am in OR  . GASTROINTESTINAL STENT REMOVAL N/A 04/20/2020   Procedure: STENT REMOVAL;  Surgeon: Rogene Houston, MD;  [facility];  Service: Endoscopy;  Laterality: N/A;  . INGUINAL HERNIA REPAIR  1971   bilateral  . INSERTION OF SUPRAPUBIC CATHETER  06/09/2017   Procedure: EXCHANGE OF  SUPRAPUBIC CATHETER;  Surgeon: Franchot Gallo, MD;  Location: [facility];  Service: Urology;;  . INSERTION OF SUPRAPUBIC CATHETER  05/22/2020   Procedure: SUPRAPUBIC CATHETER EXCHANGE;  Surgeon: Franchot Gallo, MD;  Location: [facility];  Service: Urology;;  . IR NEPHROSTOMY PLACEMENT LEFT  05/26/2017  . IR NEPHROSTOMY PLACEMENT LEFT  05/02/2020  . KIDNEY STONE SURGERY  09/13/2015  . LITHOTRIPSY N/A 03/03/2020   Procedure: MECHANICAL LITHOTRIPSY WITH REMOVAL OF MULTIPLE STONE FRAGMENTS;  Surgeon: Rogene Houston, MD;  [facility];  Service: Endoscopy;  Laterality: N/A;  . NEPHROLITHOTOMY Left 09/13/2015   Procedure: LEFT PERCUTANEOUS NEPHROLITHOTOMY ;  Surgeon: Franchot Gallo, MD;  Location: [facility];  Service: Urology;  Laterality: Left;  . NEPHROSTOMY TUBE REMOVAL  05/22/2020   Procedure: NEPHROSTOMY TUBE REMOVAL;  Surgeon: Franchot Gallo, MD;  Location: [facility];  Service: Urology;;  . REMOVAL OF STONES N/A 04/20/2020   Procedure: REMOVAL OF STONES;  Surgeon: Rogene Houston, MD;  [facility];  Service: Endoscopy;  Laterality: N/A;  . SPHINCTEROTOMY  03/03/2020   Procedure: BILLARY SPHINCTEROTOMY;  Surgeon: Rogene Houston, MD;  [facility];  Service: Endoscopy;;  . SUPRAPUBIC CATHETER INSERTION     FH  Family History  Problem Relation Age of Onset  . Cirrhosis Brother        etoh  . Stroke Mother 46  . Coronary artery disease Father 39  . Heart attack Brother   . Cancer Sister   . Multiple sclerosis Other   . Colon cancer Neg Hx    SH  reports that he quit smoking about 32 years ago. His smoking use included cigarettes. He has a 25.00 pack-year smoking history. He has never used smokeless tobacco. He reports that he does not drink alcohol and does not use drugs. Allergies  Allergies  Allergen Reactions  . Tetracyclines & Related Anaphylaxis and Rash  . Ciprofloxacin     Trouble swallowing unknown reaction according to wife    Home medications Prior to Admission medications   Medication Sig Start Date End Date Taking? Authorizing Provider  acetaminophen (TYLENOL) 325 MG tablet Take 2 tablets (650 mg total) by mouth every 6 (six) hours as needed for mild pain or headache (fever >/= 101). 07/13/20  Yes Allie Bossier, MD  ascorbic acid (VITAMIN C) 500 MG tablet Take 1 tablet (500 mg total) by mouth 2 (two) times daily. 07/13/20  Yes Allie Bossier, MD  cephALEXin (KEFLEX) 250 MG capsule Take 1 capsule (250 mg total) by mouth at bedtime. 07/13/20  Yes Allie Bossier, MD  diazepam (VALIUM) 5 MG tablet Take 1 tablet (5 mg total) by mouth every 12 (twelve) hours as needed for muscle spasms. 08/29/20  Yes Star Age, MD  guaiFENesin-dextromethorphan  (ROBITUSSIN DM) 100-10 MG/5ML syrup Take 10 mLs by mouth every 4 (four) hours as needed for cough. 07/13/20  Yes Allie Bossier, MD  HYDROcodone-acetaminophen (NORCO) 7.5-325 MG per tablet Take 1 tablet by mouth 2 (two) times daily. Max APAP 3 GM IN 24 HOURS FROM ALL SOURCES Patient taking differently: Take 1 tablet by mouth in the morning and at bedtime. ALL MEDICATIONS TO BE CRUSHED AND PLACED IN APPLESAUCE 08/22/14  Yes Pandey, Mahima, MD  Ipratropium-Albuterol (COMBIVENT) 20-100 MCG/ACT AERS respimat Inhale 1 puff into the lungs every 6 (six) hours as needed for wheezing. 07/13/20  Yes Allie Bossier, MD  levothyroxine (SYNTHROID) 75 MCG tablet Take 75 mcg by mouth daily before  breakfast. ALL MEDICATIONS TO BE CRUSHED AND PLACED IN APPLESAUCE 12/21/19  Yes [provider]  linagliptin (TRADJENTA) 5 MG TABS tablet Take 1 tablet (5 mg total) by mouth daily. 07/14/20  Yes Allie Bossier, MD  Maltodextrin-Xanthan Gum ([place]) POWD Use as directed 07/13/20  Yes Allie Bossier, MD  Multiple Vitamin (MULTIVITAMIN WITH MINERALS) TABS tablet Take 1 tablet by mouth daily. 07/14/20  Yes Allie Bossier, MD    Current Medications Scheduled Meds: . ascorbic acid  500 mg Oral BID  . heparin  5,000 Units Subcutaneous Q8H  . HYDROcodone-acetaminophen  1 tablet Oral BID  . levothyroxine  75 mcg Oral QAC breakfast  . linagliptin  5 mg Oral Daily  .  morphine injection  4 mg Intravenous Once  . multivitamin with minerals  1 tablet Oral Daily  . potassium chloride  40 mEq Oral BID   Continuous Infusions: . sodium chloride 75 mL/hr at 09/06/20 2131  . magnesium sulfate bolus IVPB 2 g (09/07/20 1037)  . piperacillin-tazobactam (ZOSYN)  IV 3.375 g (09/07/20 0636)   PRN Meds:.acetaminophen **OR** acetaminophen, diazepam, guaiFENesin-dextromethorphan, ipratropium-albuterol, Resource ThickenUp Clear  CBC Recent Labs  Lab 09/05/20 0204 09/05/20 0812 09/06/20 0429  09/07/20 0435  WBC 18.4* 20.1* 17.9* 11.2*  NEUTROABS 15.7*  --   --  8.3*  HGB 9.6* 8.8* 8.5* 7.2*  HCT 30.9* 28.3* 28.1* 23.8*  MCV 96.6 97.3 99.3 98.8  PLT 296 284 232 989   Basic Metabolic Panel Recent Labs  Lab 09/05/20 0204 09/05/20 0534 09/05/20 0812 09/06/20 0429 09/07/20 0435  NA 134* 135  --  140 138  K 2.7* 3.3*  --  3.4* 3.3*  CL 98 100  --  105 102  CO2 25 24  --  25 26  GLUCOSE 144* 154*  --  89 97  BUN 39* 40*  --  42* 43*  CREATININE 3.39* 3.54* 3.67* 3.69* 3.70*  CALCIUM 9.2 8.5*  --  8.4* 7.9*  PHOS  --   --   --   --  4.5    Physical Exam  Blood pressure 110/60, pulse (!) 57, temperature 98 F (36.7 C), resp. rate 17, height 5\' 3"  (1.6 m), weight 49 kg, SpO2 100 %. GEN: Chronically ill-appearing, NAD in bed ENT: NCAT, poor dentition EYES: EOMI CV: Regular, normal S1 and S2 PULM: CTA B ABD/GU: Soft, nontender suprapubic catheter site bandaged, clear yellow urine in Foley bag SKIN: As above, no rashes or lesions EXT: No peripheral edema  Assessment 18M AoCKD3 after recurrent obstruction immediately following removal of left ureteral stent with likely solitary functioning kidney, having longstanding right-sided renal atrophy.  Likely multifactorial etiology including obstruction with proximal infection, sepsis with bacteremia, gentamicin exposure.  Excellent urine output, stable acid/base, K; no indication for dialysis.  1. AoCKD3: As above, likely related to obstructive uropathy and sepsis, excellent UOP, no indication for RRT, likely in plateau phase and anticipated recovery of GFR in time 2. Obstructing left ureteral stone immediately following stent removal with proximal polymicrobial infection status post new left ureteral stent placement 3. Polymicrobial bacteremia/sepsis, enterococcal faecalis and pseudomonas, on Zosyn, per TRH and [facility] 4. Neurogenic bladder as a consequence of #5 5. Multiple sclerosis 6. Recent severe COVID-19 infection October  2021 requiring 10-day hospitalization 7. Chronic HFpEF 8. CAD 9. History of paroxysmal atrial fibrillation  Plan 1. Continue supportive care, likely will recover GFR in time 2. Will need outpatient nephrology follow-up, will be left with significant  reduction in GFR even with favorable improvements 3. Continue IV fluids until tolerating p.o. well 4. Daily weights, Daily Renal Panel, Strict I/Os, Avoid nephrotoxins (NSAIDs, judicious IV Contrast)    Rexene Agent  [phone] pgr 09/07/2020, 10:48 AM

## 2020-09-07 NOTE — Progress Notes (Signed)
PROGRESS NOTE    Jordan Ward  WER:[id number] DOB: 24-Oct-1940 DOA: 09/05/2020 PCP: Noreene Larsson, NP    Brief Narrative:  79 year old gentleman with extensive medical comorbidities including coronary artery disease, multiple sclerosis and debility, paroxysmal A. fib, recent history of COVID with respiratory failure and now on oxygen, congenital atrophic right kidney, stone in the left kidney, neurogenic bladder with chronic suprapubic catheter admitted to the hospital secondary to sepsis. Patient underwent cystoscopy, double-J stent removal and suprapubic catheter exchange on 12/7, very next day started having fever and left flank pain so came to the ER where he was found with fever, tachycardia, WC count of 18.4, creatinine 3.39.  He was treated with IV fluids and antibiotics and taken to the OR for left ureteric stent placement. 12/9, patient growing Enterococcus faecalis and Pseudomonas bacteremia.  Assessment & Plan:   Active Problems:   Obstructive uropathy  Sepsis present on admission secondary to obstructive uropathy/infected kidney stone: Enterococcus faecalis and Pseudomonas bacteremia. Status post left ureteric stent placement, urine output 3600 mL reported last 24 h. Hemodynamically stabilizing. Continue Zosyn until final sensitivity. Repeat blood cultures pending. Seen by ID. 2D echocardiogram with no obvious bacteremia. If repeat blood cultures persistent positive, will order TEE. Postsurgical management as per urology.  Acute on chronic renal failure: Baseline stage IV chronic kidney disease, atrophic right kidney. Known baseline creatinine about 1.9-2. Likely from sepsis and obstruction. Urine output is adequate.  We will keep on IV fluids and maintenance fluid to continue. [facility] consult nephrology for their recommendations.  Type 2 diabetes: On Tradjenta at home.  Well controlled.  Remains on sliding scale insulin.  Multiple sclerosis/neurogenic bladder: Start  mobility. Working with PT OT.  Hypokalemia/hypomagnesemia: Patient with poor renal function.  Replace potassium and magnesium today. He has good urine output.  Hypothyroidism: On Synthroid that continued.  COPD with chronic hypoxemic respiratory failure on 2 L oxygen at home: Stable.  Anemia of chronic kidney disease: Multifactorial anemia. Hemoglobin 7.2. Will check iron studies. If low, will give IV iron in the hospital.   Called and discussed case with nephrology. Consult placed.   DVT prophylaxis: heparin injection 5,000 Units Start: 09/05/20 0830   Code Status: Full code Family Communication: None at the bedside Disposition Plan: Status is: Inpatient  Remains inpatient appropriate because:Persistent severe electrolyte disturbances and Inpatient level of care appropriate due to severity of illness   Dispo: The patient is from: Home              Anticipated d/c is to: Home with home health              Anticipated d/c date is: 3 days              Patient currently is not medically stable to d/c.         Consultants:   Urology  Nephrology  Procedures:   Cystoscopy, left ureteric stent placed  Antimicrobials:  Anti-infectives (From admission, onward)   Start     Dose/Rate Route Frequency Ordered Stop   09/07/20 0000  cefTRIAXone (ROCEPHIN) 2 g in sodium chloride 0.9 % 100 mL IVPB  Status:  Discontinued        2 g 200 mL/hr over 30 Minutes Intravenous Every 24 hours 09/06/20 0729 09/06/20 0730   09/06/20 1800  piperacillin-tazobactam (ZOSYN) IVPB 3.375 g        3.375 g 12.5 mL/hr over 240 Minutes Intravenous Every 12 hours 09/06/20 0805     09/06/20  0830  piperacillin-tazobactam (ZOSYN) IVPB 3.375 g        3.375 g 100 mL/hr over 30 Minutes Intravenous  Once 09/06/20 0757 09/06/20 0939   09/06/20 0730  cefTRIAXone (ROCEPHIN) 2 g in sodium chloride 0.9 % 100 mL IVPB  Status:  Discontinued        2 g 200 mL/hr over 30 Minutes Intravenous Every 24 hours  09/06/20 0730 09/06/20 0757   09/06/20 0000  cefTRIAXone (ROCEPHIN) 1 g in sodium chloride 0.9 % 100 mL IVPB  Status:  Discontinued        1 g 200 mL/hr over 30 Minutes Intravenous Every 24 hours 09/05/20 0815 09/06/20 0729   09/05/20 2200  cephALEXin (KEFLEX) capsule 250 mg  Status:  Discontinued        250 mg Oral Daily at bedtime 09/05/20 0845 09/05/20 0852   09/05/20 0600  cefTRIAXone (ROCEPHIN) 1 g in sodium chloride 0.9 % 100 mL IVPB        1 g 200 mL/hr over 30 Minutes Intravenous  Once 09/05/20 0545 09/05/20 0650         Subjective: Patient seen and examined. Afebrile overnight. Feels weak and tired. Suprapubic catheter keeps leaking.  Objective: Vitals:   09/06/20 1724 09/06/20 2030 09/06/20 2130 09/07/20 0545  BP: 139/79  108/65 110/60  Pulse: 78  75 (!) 57  Resp: 16  18 17   Temp: 98.8 F (37.1 C)  98.4 F (36.9 C) 98 F (36.7 C)  TempSrc:      SpO2: 95% 96% 100% 100%  Weight:      Height:        Intake/Output Summary (Last 24 hours) at 09/07/2020 1100 Last data filed at 09/07/2020 0900 Gross per 24 hour  Intake 700 ml  Output 3600 ml  Net -2900 ml   Filed Weights   09/05/20 0139  Weight: 49 kg    Examination:  General exam: Appears calm and comfortable  Chronically sick looking and frail gentleman lying in bed. Thin and frail, muscle wasting. Respiratory system: Clear to auscultation. Respiratory effort normal. Cardiovascular system: S1 & S2 heard, RRR. No pedal edema. Gastrointestinal system: Soft.  Nontender.  Bowel sounds present. Suprapubic catheter in place, draining clear urine. He has leakage around suprapubic catheter. Central nervous system: Alert and oriented. No focal neurological deficits. Extremities: Generalized weakness. Skin: No rashes, lesions or ulcers Psychiatry: Judgement and insight appear normal. Mood & affect flat.    Data Reviewed: I have personally reviewed following labs and imaging studies  CBC: Recent Labs  Lab  09/05/20 0204 09/05/20 0812 09/06/20 0429 09/07/20 0435  WBC 18.4* 20.1* 17.9* 11.2*  NEUTROABS 15.7*  --   --  8.3*  HGB 9.6* 8.8* 8.5* 7.2*  HCT 30.9* 28.3* 28.1* 23.8*  MCV 96.6 97.3 99.3 98.8  PLT 296 284 232 951   Basic Metabolic Panel: Recent Labs  Lab 09/05/20 0204 09/05/20 0534 09/05/20 0812 09/06/20 0429 09/07/20 0435  NA 134* 135  --  140 138  K 2.7* 3.3*  --  3.4* 3.3*  CL 98 100  --  105 102  CO2 25 24  --  25 26  GLUCOSE 144* 154*  --  89 97  BUN 39* 40*  --  42* 43*  CREATININE 3.39* 3.54* 3.67* 3.69* 3.70*  CALCIUM 9.2 8.5*  --  8.4* 7.9*  MG  --   --  1.6*  --  1.7  PHOS  --   --   --   --  4.5   GFR: Estimated Creatinine Clearance: 11.6 mL/min (A) (by C-G formula based on SCr of 3.7 mg/dL (H)). Liver Function Tests: No results for input(s): AST, ALT, ALKPHOS, BILITOT, PROT, ALBUMIN in the last 168 hours. No results for input(s): LIPASE, AMYLASE in the last 168 hours. No results for input(s): AMMONIA in the last 168 hours. Coagulation Profile: No results for input(s): INR, PROTIME in the last 168 hours. Cardiac Enzymes: No results for input(s): CKTOTAL, CKMB, CKMBINDEX, TROPONINI in the last 168 hours. BNP (last 3 results) No results for input(s): PROBNP in the last 8760 hours. HbA1C: No results for input(s): HGBA1C in the last 72 hours. CBG: No results for input(s): GLUCAP in the last 168 hours. Lipid Profile: No results for input(s): CHOL, HDL, LDLCALC, TRIG, CHOLHDL, LDLDIRECT in the last 72 hours. Thyroid Function Tests: No results for input(s): TSH, T4TOTAL, FREET4, T3FREE, THYROIDAB in the last 72 hours. Anemia Panel: No results for input(s): VITAMINB12, FOLATE, FERRITIN, TIBC, IRON, RETICCTPCT in the last 72 hours. Sepsis Labs: Recent Labs  Lab 09/05/20 0204 09/05/20 0534  LATICACIDVEN 1.4 1.7    Recent Results (from the past 240 hour(s))  Resp Panel by RT-PCR (Flu A&B, Covid) Nasopharyngeal Swab     Status: None   Collection Time:  09/05/20  5:16 AM   Specimen: Nasopharyngeal Swab; Nasopharyngeal(NP) swabs in vial transport medium  Result Value Ref Range Status   SARS Coronavirus 2 by RT PCR NEGATIVE NEGATIVE Final    Comment: (NOTE) SARS-CoV-2 target nucleic acids are NOT DETECTED.  The SARS-CoV-2 RNA is generally detectable in upper respiratory specimens during the acute phase of infection. The lowest concentration of SARS-CoV-2 viral copies this assay can detect is 138 copies/mL. A negative result does not preclude SARS-Cov-2 infection and should not be used as the sole basis for treatment or other patient management decisions. A negative result may occur with  improper specimen collection/handling, submission of specimen other than nasopharyngeal swab, presence of viral mutation(s) within the areas targeted by this assay, and inadequate number of viral copies(<138 copies/mL). A negative result must be combined with clinical observations, patient history, and epidemiological information. The expected result is Negative.  Fact Sheet for Patients:  EntrepreneurPulse.com.au  Fact Sheet for Healthcare Providers:  IncredibleEmployment.be  This test is no t yet approved or cleared by the [place] FDA and  has been authorized for detection and/or diagnosis of SARS-CoV-2 by FDA under an Emergency Use Authorization (EUA). This EUA will remain  in effect (meaning this test can be used) for the duration of the COVID-19 declaration under Section 564(b)(1) of the Act, 21 U.S.C.section 360bbb-3(b)(1), unless the authorization is terminated  or revoked sooner.       Influenza A by PCR NEGATIVE NEGATIVE Final   Influenza B by PCR NEGATIVE NEGATIVE Final    Comment: (NOTE) The Xpert Xpress SARS-CoV-2/FLU/RSV plus assay is intended as an aid in the diagnosis of influenza from Nasopharyngeal swab specimens and should not be used as a sole basis for treatment. Nasal washings  and aspirates are unacceptable for Xpert Xpress SARS-CoV-2/FLU/RSV testing.  Fact Sheet for Patients: EntrepreneurPulse.com.au  Fact Sheet for Healthcare Providers: IncredibleEmployment.be  This test is not yet approved or cleared by the [place] FDA and has been authorized for detection and/or diagnosis of SARS-CoV-2 by FDA under an Emergency Use Authorization (EUA). This EUA will remain in effect (meaning this test can be used) for the duration of the COVID-19 declaration under Section 564(b)(1) of the Act, 21  U.S.C. section 360bbb-3(b)(1), unless the authorization is terminated or revoked.  Performed at [facility], [address] [place], [place] [address]   Blood culture (routine x 2)     Status: None (Preliminary result)   Collection Time: 09/05/20  6:10 AM   Specimen: BLOOD  Result Value Ref Range Status   Specimen Description   Final    BLOOD LEFT WRIST Performed at [facility], [address] [place], [place] [address]    Special Requests   Final    BOTTLES DRAWN AEROBIC AND ANAEROBIC Blood Culture adequate volume Performed at [facility], [address], [place], [place] [address]    Culture  Setup Time   Final    GRAM POSITIVE COCCI IN CHAINS GRAM NEGATIVE RODS AEROBIC AND ANAEROBIC BOTTLES Gram Stain Report Called to,Read Back By and Verified With: EVANS,H ON 09/05/20 AT 2340 BY LOY,C Performed at [facility] Performed at [facility], [address], [place], [place] [address]    Culture   Final    Lonell Grandchild POSITIVE COCCI GRAM NEGATIVE RODS IDENTIFICATION TO FOLLOW Performed at [facility], [place] [address], [place], [place] [address]    Report Status PENDING  Incomplete  Blood culture (routine x 2)     Status: Abnormal (Preliminary result)   Collection Time: 09/05/20  6:12 AM   Specimen: BLOOD  Result Value Ref Range Status   Specimen Description   Final    BLOOD RIGHT ANTECUBITAL Performed at [facility], [address], [place], [place] [address]    Special Requests   Final    BOTTLES DRAWN AEROBIC AND ANAEROBIC Blood Culture adequate volume Performed at [facility], [address] [place], [place] [address]    Culture  Setup Time   Final    GRAM POSITIVE COCCI IN CHAINS GRAM NEGATIVE RODS BOTH ANAEROBIC AND AEROBIC BOTTLES Gram Stain Report Called to,Read Back By and Verified With: EVANS,H ON 09/05/20 AT 2340 BY LOY,C Performed at [facility] to follow Performed at [facility], [address], [place], [place] [address]    Culture (A)  Final    ENTEROCOCCUS FAECALIS PSEUDOMONAS AERUGINOSA SUSCEPTIBILITIES TO FOLLOW CULTURE REINCUBATED FOR BETTER GROWTH Performed at [facility], [place] [address] [place], [place] [address]    Report Status PENDING  Incomplete  Blood Culture ID Panel (Reflexed)     Status: Abnormal   Collection Time: 09/05/20  6:12 AM  Result Value Ref Range Status   Enterococcus faecalis DETECTED (A) NOT DETECTED Final    Comment: CRITICAL RESULT CALLED TO, READ BACK BY AND VERIFIED WITH: H EVANS RN 09/06/20 0432 JDW    Enterococcus Faecium NOT DETECTED NOT DETECTED Final   Listeria monocytogenes NOT DETECTED NOT DETECTED Final   Staphylococcus species NOT DETECTED NOT DETECTED Final   Staphylococcus aureus (BCID) NOT DETECTED NOT DETECTED Final   Staphylococcus epidermidis NOT DETECTED NOT DETECTED Final   Staphylococcus lugdunensis NOT DETECTED NOT DETECTED Final   Streptococcus species NOT DETECTED NOT DETECTED Final   Streptococcus agalactiae NOT DETECTED NOT DETECTED Final   Streptococcus pneumoniae NOT DETECTED NOT DETECTED Final   Streptococcus pyogenes NOT DETECTED NOT DETECTED Final   A.calcoaceticus-baumannii NOT DETECTED NOT DETECTED Final   Bacteroides fragilis NOT DETECTED NOT DETECTED Final   Enterobacterales DETECTED (A) NOT DETECTED Final    Comment: Enterobacterales represent a large order of gram negative  bacteria, not a single organism. Refer to culture for further identification. CRITICAL RESULT  CALLED TO, READ BACK BY AND VERIFIED WITH: H EVANS RN 09/06/20 0432 JDW    Enterobacter cloacae complex NOT DETECTED NOT DETECTED Final   Escherichia coli NOT DETECTED NOT DETECTED Final   Klebsiella aerogenes NOT DETECTED NOT DETECTED Final   Klebsiella oxytoca NOT DETECTED NOT DETECTED Final   Klebsiella pneumoniae NOT DETECTED NOT DETECTED Final   Proteus species NOT DETECTED NOT DETECTED Final   Salmonella species NOT DETECTED NOT DETECTED Final   Serratia marcescens NOT DETECTED NOT DETECTED Final   Haemophilus influenzae NOT DETECTED NOT DETECTED Final   Neisseria meningitidis NOT DETECTED NOT DETECTED Final   Pseudomonas aeruginosa DETECTED (A) NOT DETECTED Final    Comment: CRITICAL RESULT CALLED TO, READ BACK BY AND VERIFIED WITH: H EVANS RN 09/06/20 0432 JDW    Stenotrophomonas maltophilia NOT DETECTED NOT DETECTED Final   Candida albicans NOT DETECTED NOT DETECTED Final   Candida auris NOT DETECTED NOT DETECTED Final   Candida glabrata NOT DETECTED NOT DETECTED Final   Candida krusei NOT DETECTED NOT DETECTED Final   Candida parapsilosis NOT DETECTED NOT DETECTED Final   Candida tropicalis NOT DETECTED NOT DETECTED Final   Cryptococcus neoformans/gattii NOT DETECTED NOT DETECTED Final   CTX-M ESBL NOT DETECTED NOT DETECTED Final   Carbapenem resistance IMP NOT DETECTED NOT DETECTED Final   Carbapenem resistance KPC NOT DETECTED NOT DETECTED Final   Carbapenem resistance NDM NOT DETECTED NOT DETECTED Final   Carbapenem resist OXA 48 LIKE NOT DETECTED NOT DETECTED Final   Vancomycin resistance NOT DETECTED NOT DETECTED Final   Carbapenem resistance VIM NOT DETECTED NOT DETECTED Final    Comment: Performed at [facility], [address] [place], [place] [address]  Culture, blood (routine x 2)     Status: None (Preliminary result)   Collection Time: 09/06/20  5:30 AM    Specimen: BLOOD RIGHT HAND  Result Value Ref Range Status   Specimen Description BLOOD RIGHT HAND  Final   Special Requests   Final    BOTTLES DRAWN AEROBIC ONLY Blood Culture results may not be optimal due to an inadequate volume of blood received in culture bottles   Culture   Final    NO GROWTH 1 DAY Performed at [facility], [address] [place], [place] [address]    Report Status PENDING  Incomplete  Culture, blood (routine x 2)     Status: None (Preliminary result)   Collection Time: 09/06/20  5:40 AM   Specimen: BLOOD RIGHT HAND  Result Value Ref Range Status   Specimen Description BLOOD RIGHT HAND  Final   Special Requests   Final    BOTTLES DRAWN AEROBIC AND ANAEROBIC Blood Culture results may not be optimal due to an inadequate volume of blood received in culture bottles   Culture   Final    NO GROWTH 1 DAY Performed at [facility], [address] [place], [place] [address]    Report Status PENDING  Incomplete         Radiology Studies: ECHOCARDIOGRAM COMPLETE  Result Date: 09/06/2020    ECHOCARDIOGRAM REPORT   Patient Name:   Jordan Ward Carberry Date of Exam: 09/06/2020 Medical Rec #:  [id number]    Height:       63.0 in Accession #:    [id number]   Weight:       108.0 lb Date of Birth:  07-10-1941    BSA:          1.488 m Patient  Age:    25 years     BP:           134/70 mmHg Patient Gender: M            HR:           73 bpm. Exam Location:  [facility] Procedure: 2D Echo Indications:    Bacteremia 790.7 / R78.81  History:        Patient has prior history of Echocardiogram examinations, most                 recent 06/15/2017. CHF, Stroke, Signs/Symptoms:Bacteremia; Risk                 Factors:Hypertension and Former Smoker. Pulmonary Fibroisis,                 Pneumonia due to Covid.  Sonographer:    Leavy Cella RDCS (AE) Referring Phys: [id number] [place]  1. Left ventricular ejection fraction, by estimation, is 50 to 55%. The left ventricle has low normal  function. The left ventricle demonstrates regional Borras motion abnormalities (see scoring diagram/findings for description). There is mild left ventricular hypertrophy. Left ventricular diastolic parameters are consistent with Grade I diastolic dysfunction (impaired relaxation).  2. Right ventricular systolic function is mildly reduced. The right ventricular size is normal. There is normal pulmonary artery systolic pressure. The estimated right ventricular systolic pressure is 35.3 mmHg.  3. The mitral valve is grossly normal. Trivial mitral valve regurgitation.  4. The aortic valve is tricuspid. There is moderate calcification of the aortic valve. Aortic valve regurgitation is not visualized. Mild to moderate aortic valve sclerosis/calcification is present, without any evidence of aortic stenosis.  5. The inferior vena cava is normal in size with greater than 50% respiratory variability, suggesting right atrial pressure of 3 mmHg.  6. No obvious valvular vegetations. FINDINGS  Left Ventricle: Left ventricular ejection fraction, by estimation, is 50 to 55%. The left ventricle has low normal function. The left ventricle demonstrates regional Stauch motion abnormalities. The left ventricular internal cavity size was normal in size. There is mild left ventricular hypertrophy. Left ventricular diastolic parameters are consistent with Grade I diastolic dysfunction (impaired relaxation).  LV Shuey Scoring: The posterior Schoenfeld is hypokinetic. The antero-lateral Reierson, entire septum, apical lateral segment, and apex are normal. Right Ventricle: The right ventricular size is normal. No increase in right ventricular Kienle thickness. Right ventricular systolic function is mildly reduced. There is normal pulmonary artery systolic pressure. The tricuspid regurgitant velocity is 2.51 m/s, and with an assumed right atrial pressure of 3 mmHg, the estimated right ventricular systolic pressure is 61.4 mmHg. Left Atrium: Left atrial size  was normal in size. Right Atrium: Right atrial size was normal in size. Pericardium: There is no evidence of pericardial effusion. Mitral Valve: The mitral valve is grossly normal. Trivial mitral valve regurgitation. Tricuspid Valve: The tricuspid valve is grossly normal. Tricuspid valve regurgitation is mild. Aortic Valve: The aortic valve is tricuspid. There is moderate calcification of the aortic valve. There is mild aortic valve annular calcification. Aortic valve regurgitation is not visualized. Mild to moderate aortic valve sclerosis/calcification is present, without any evidence of aortic stenosis. Pulmonic Valve: The pulmonic valve was grossly normal. Pulmonic valve regurgitation is trivial. Aorta: The aortic root is normal in size and structure. Venous: The inferior vena cava is normal in size with greater than 50% respiratory variability, suggesting right atrial pressure of 3 mmHg. IAS/Shunts: No atrial level shunt detected  by color flow Doppler.  LEFT VENTRICLE PLAX 2D LVIDd:         3.95 cm  Diastology LVIDs:         3.68 cm  LV e' medial:    3.59 cm/s LV PW:         1.20 cm  LV E/e' medial:  14.3 LV IVS:        1.24 cm  LV e' lateral:   4.03 cm/s LVOT diam:     2.00 cm  LV E/e' lateral: 12.8 LVOT Area:     3.14 cm  RIGHT VENTRICLE RV S prime:     13.80 cm/s TAPSE (M-mode): 2.0 cm LEFT ATRIUM           Index       RIGHT ATRIUM           Index LA diam:      3.40 cm 2.28 cm/m  RA Area:     11.90 cm LA Vol (A2C): 55.6 ml 37.36 ml/m RA Volume:   28.30 ml  19.02 ml/m LA Vol (A4C): 23.6 ml 15.86 ml/m  MITRAL VALVE               TRICUSPID VALVE MV Area (PHT): 3.06 cm    TR Peak grad:   25.2 mmHg MV Decel Time: 248 msec    TR Vmax:        251.00 cm/s MV E velocity: 51.40 cm/s MV A velocity: 75.80 cm/s  SHUNTS MV E/A ratio:  0.68        Systemic Diam: 2.00 cm Rozann Lesches MD Electronically signed by Rozann Lesches MD Signature Date/Time: 09/06/2020/4:32:25 PM    Final         Scheduled Meds: .  ascorbic acid  500 mg Oral BID  . heparin  5,000 Units Subcutaneous Q8H  . HYDROcodone-acetaminophen  1 tablet Oral BID  . levothyroxine  75 mcg Oral QAC breakfast  . linagliptin  5 mg Oral Daily  .  morphine injection  4 mg Intravenous Once  . multivitamin with minerals  1 tablet Oral Daily  . potassium chloride  40 mEq Oral BID   Continuous Infusions: . sodium chloride 75 mL/hr at 09/06/20 2131  . magnesium sulfate bolus IVPB 2 g (09/07/20 1037)  . piperacillin-tazobactam (ZOSYN)  IV 3.375 g (09/07/20 0636)     LOS: 2 days    Time spent: 30 minutes    Barb Merino, MD [facility] Pager [phone]

## 2020-09-07 NOTE — Progress Notes (Signed)
Patient's suprapubic catheter has been leaking this shift.  Notified hospitalist.  No new orders received at this time. Patient otherwise stable.

## 2020-09-08 LAB — IRON AND TIBC
Iron: 51 ug/dL (ref 45–182)
Saturation Ratios: 28 % (ref 17.9–39.5)
TIBC: 185 ug/dL — ABNORMAL LOW (ref 250–450)
UIBC: 134 ug/dL

## 2020-09-08 LAB — FOLATE: Folate: 16.2 ng/mL (ref >5.9)

## 2020-09-08 LAB — BASIC METABOLIC PANEL
Anion gap: 10 (ref 5–15)
BUN: 35 mg/dL — ABNORMAL HIGH (ref 8–23)
CO2: 26 mmol/L (ref 22–32)
Calcium: 8.1 mg/dL — ABNORMAL LOW (ref 8.9–10.3)
Chloride: 101 mmol/L (ref 98–111)
Creatinine, Ser: 3.06 mg/dL — ABNORMAL HIGH (ref 0.61–1.24)
GFR, Estimated: 20 mL/min — ABNORMAL LOW (ref >60)
Glucose, Bld: 92 mg/dL (ref 70–99)
Potassium: 3.3 mmol/L — ABNORMAL LOW (ref 3.5–5.1)
Sodium: 137 mmol/L (ref 135–145)

## 2020-09-08 LAB — CBC WITH DIFFERENTIAL/PLATELET
Abs Immature Granulocytes: 0.03 10*3/uL (ref 0.00–0.07)
Basophils Absolute: 0 10*3/uL (ref 0.0–0.1)
Basophils Relative: 1 %
Eosinophils Absolute: 0.2 10*3/uL (ref 0.0–0.5)
Eosinophils Relative: 3 %
HCT: 26.7 % — ABNORMAL LOW (ref 39.0–52.0)
Hemoglobin: 8.1 g/dL — ABNORMAL LOW (ref 13.0–17.0)
Immature Granulocytes: 0 %
Lymphocytes Relative: 29 %
Lymphs Abs: 2.1 10*3/uL (ref 0.7–4.0)
MCH: 29.3 pg (ref 26.0–34.0)
MCHC: 30.3 g/dL (ref 30.0–36.0)
MCV: 96.7 fL (ref 80.0–100.0)
Monocytes Absolute: 0.6 10*3/uL (ref 0.1–1.0)
Monocytes Relative: 8 %
Neutro Abs: 4.4 10*3/uL (ref 1.7–7.7)
Neutrophils Relative %: 59 %
Platelets: 223 10*3/uL (ref 150–400)
RBC: 2.76 MIL/uL — ABNORMAL LOW (ref 4.22–5.81)
RDW: 15 % (ref 11.5–15.5)
WBC: 7.3 10*3/uL (ref 4.0–10.5)
nRBC: 0 % (ref 0.0–0.2)

## 2020-09-08 LAB — CULTURE, BLOOD (ROUTINE X 2): Special Requests: ADEQUATE

## 2020-09-08 LAB — FERRITIN: Ferritin: 282 ng/mL (ref 24–336)

## 2020-09-08 LAB — VITAMIN B12: Vitamin B-12: 405 pg/mL (ref 180–914)

## 2020-09-08 MED ORDER — POTASSIUM CHLORIDE CRYS ER 20 MEQ PO TBCR
40.0000 meq | EXTENDED_RELEASE_TABLET | Freq: Two times a day (BID) | ORAL | Status: AC
  Administered 2020-09-08 (×2): 40 meq via ORAL
  Filled 2020-09-08 (×2): qty 2

## 2020-09-08 NOTE — Progress Notes (Signed)
PROGRESS NOTE    Jordan Ward  PIR:[id number] DOB: 1941/05/06 DOA: 09/05/2020 PCP: Noreene Larsson, NP    Brief Narrative:  79 year old gentleman with extensive medical comorbidities including coronary artery disease, multiple sclerosis and debility, paroxysmal A. fib, recent history of COVID with respiratory failure and now on oxygen, congenital atrophic right kidney, stone in the left kidney, neurogenic bladder with chronic suprapubic catheter admitted to the hospital secondary to sepsis. Patient underwent cystoscopy, double-J stent removal and suprapubic catheter exchange on 12/7, very next day started having fever and left flank pain so came to the ER where he was found with fever, tachycardia, WC count of 18.4, creatinine 3.39.  He was treated with IV fluids and antibiotics and taken to the OR for left ureteric stent placement. 12/9, patient growing Enterococcus faecalis and Pseudomonas bacteremia.  Assessment & Plan:   Active Problems:   Obstructive uropathy  Sepsis present on admission secondary to obstructive uropathy/infected kidney stone: Enterococcus faecalis and Pseudomonas bacteremia. Status post cystoscopy and left ureteric stent placement.  Clinically stabilizing. Continue Zosyn until final cultures are available for Enterococcus and Pseudomonas. 2D echocardiogram with no obvious endocarditis. Repeat blood cultures with no growth.  With rapid defervescence of bacteremia, as per ID recommendation no need for TEE. Postsurgical management as per urology.  Acute on chronic renal failure: Baseline stage IV chronic kidney disease, atrophic right kidney. Known baseline creatinine about 1.9-2. Likely from sepsis and obstruction. Urine output is adequate.  We will keep on IV fluids and maintenance fluid to continue. Seen by nephrology.  Now in diuresis page.  We will continue IV fluid.  Type 2 diabetes: On Tradjenta at home.  Well controlled.  Remains on sliding scale  insulin.  Multiple sclerosis/neurogenic bladder: Will benefit with mobility.  PT OT at home.  Hypokalemia/hypomagnesemia: Replaced.  Adequate.  Hypothyroidism: On Synthroid that was continued on same doses.  COPD with chronic hypoxemic respiratory failure on 2 L oxygen at home: Stable.  Anemia of chronic kidney disease: Multifactorial anemia. Hemoglobin 8.  Iron level is adequate.  B12 is adequate.  DVT prophylaxis: heparin injection 5,000 Units Start: 09/05/20 0830   Code Status: Full code Family Communication: None at the bedside Disposition Plan: Status is: Inpatient  Remains inpatient appropriate because:Persistent severe electrolyte disturbances and Inpatient level of care appropriate due to severity of illness   Dispo: The patient is from: Home              Anticipated d/c is to: Home with home health              Anticipated d/c date is: 3 days              Patient currently is not medically stable to d/c.         Consultants:   Urology  Nephrology  Procedures:   Cystoscopy, left ureteric stent placed  Antimicrobials:  Anti-infectives (From admission, onward)   Start     Dose/Rate Route Frequency Ordered Stop   09/07/20 0000  cefTRIAXone (ROCEPHIN) 2 g in sodium chloride 0.9 % 100 mL IVPB  Status:  Discontinued        2 g 200 mL/hr over 30 Minutes Intravenous Every 24 hours 09/06/20 0729 09/06/20 0730   09/06/20 1800  piperacillin-tazobactam (ZOSYN) IVPB 3.375 g        3.375 g 12.5 mL/hr over 240 Minutes Intravenous Every 12 hours 09/06/20 0805     09/06/20 0830  piperacillin-tazobactam (ZOSYN) IVPB 3.375 g  3.375 g 100 mL/hr over 30 Minutes Intravenous  Once 09/06/20 0757 09/06/20 0939   09/06/20 0730  cefTRIAXone (ROCEPHIN) 2 g in sodium chloride 0.9 % 100 mL IVPB  Status:  Discontinued        2 g 200 mL/hr over 30 Minutes Intravenous Every 24 hours 09/06/20 0730 09/06/20 0757   09/06/20 0000  cefTRIAXone (ROCEPHIN) 1 g in sodium chloride 0.9  % 100 mL IVPB  Status:  Discontinued        1 g 200 mL/hr over 30 Minutes Intravenous Every 24 hours 09/05/20 0815 09/06/20 0729   09/05/20 2200  cephALEXin (KEFLEX) capsule 250 mg  Status:  Discontinued        250 mg Oral Daily at bedtime 09/05/20 0845 09/05/20 0852   09/05/20 0600  cefTRIAXone (ROCEPHIN) 1 g in sodium chloride 0.9 % 100 mL IVPB        1 g 200 mL/hr over 30 Minutes Intravenous  Once 09/05/20 0545 09/05/20 0650         Subjective: Patient seen and examined.  No overnight events.  Feels weak, wants to walk.  Suprapubic catheter leaking.  Objective: Vitals:   09/07/20 0545 09/07/20 1353 09/07/20 1900 09/08/20 0623  BP: 110/60 126/68 132/67 119/69  Pulse: (!) 57 66 62 64  Resp: 17 16 16 20   Temp: 98 F (36.7 C) 98.6 F (37 C) 98.4 F (36.9 C) 97.7 F (36.5 C)  TempSrc:  Oral Oral Oral  SpO2: 100% 100% 100% 99%  Weight:    51.4 kg  Height:        Intake/Output Summary (Last 24 hours) at 09/08/2020 1108 Last data filed at 09/08/2020 0086 Gross per 24 hour  Intake 903.21 ml  Output 3250 ml  Net -2346.79 ml   Filed Weights   09/05/20 0139 09/08/20 0623  Weight: 49 kg 51.4 kg    Examination:  General exam: Appears calm and comfortable  Chronically sick looking and frail gentleman lying in bed. Thin and frail, muscle wasting. Respiratory system: Clear to auscultation. Respiratory effort normal. Cardiovascular system: S1 & S2 heard, RRR. No pedal edema. Gastrointestinal system: Soft.  Nontender.  Bowel sounds present. Suprapubic catheter in place, draining clear urine. He has leakage around suprapubic catheter. Central nervous system: Alert and oriented. No focal neurological deficits. Extremities: Generalized weakness. Skin: No rashes, lesions or ulcers Psychiatry: Judgement and insight appear normal. Mood & affect normal.    Data Reviewed: I have personally reviewed following labs and imaging studies  CBC: Recent Labs  Lab 09/05/20 0204  09/05/20 0812 09/06/20 0429 09/07/20 0435 09/08/20 0431  WBC 18.4* 20.1* 17.9* 11.2* 7.3  NEUTROABS 15.7*  --   --  8.3* 4.4  HGB 9.6* 8.8* 8.5* 7.2* 8.1*  HCT 30.9* 28.3* 28.1* 23.8* 26.7*  MCV 96.6 97.3 99.3 98.8 96.7  PLT 296 284 232 203 761   Basic Metabolic Panel: Recent Labs  Lab 09/05/20 0204 09/05/20 0534 09/05/20 0812 09/06/20 0429 09/07/20 0435 09/08/20 0431  NA 134* 135  --  140 138 137  K 2.7* 3.3*  --  3.4* 3.3* 3.3*  CL 98 100  --  105 102 101  CO2 25 24  --  25 26 26   GLUCOSE 144* 154*  --  89 97 92  BUN 39* 40*  --  42* 43* 35*  CREATININE 3.39* 3.54* 3.67* 3.69* 3.70* 3.06*  CALCIUM 9.2 8.5*  --  8.4* 7.9* 8.1*  MG  --   --  1.6*  --  1.7  --   PHOS  --   --   --   --  4.5  --    GFR: Estimated Creatinine Clearance: 14.7 mL/min (A) (by C-G formula based on SCr of 3.06 mg/dL (H)). Liver Function Tests: No results for input(s): AST, ALT, ALKPHOS, BILITOT, PROT, ALBUMIN in the last 168 hours. No results for input(s): LIPASE, AMYLASE in the last 168 hours. No results for input(s): AMMONIA in the last 168 hours. Coagulation Profile: No results for input(s): INR, PROTIME in the last 168 hours. Cardiac Enzymes: No results for input(s): CKTOTAL, CKMB, CKMBINDEX, TROPONINI in the last 168 hours. BNP (last 3 results) No results for input(s): PROBNP in the last 8760 hours. HbA1C: No results for input(s): HGBA1C in the last 72 hours. CBG: No results for input(s): GLUCAP in the last 168 hours. Lipid Profile: No results for input(s): CHOL, HDL, LDLCALC, TRIG, CHOLHDL, LDLDIRECT in the last 72 hours. Thyroid Function Tests: No results for input(s): TSH, T4TOTAL, FREET4, T3FREE, THYROIDAB in the last 72 hours. Anemia Panel: Recent Labs    09/08/20 0023  VITAMINB12 405  FOLATE 16.2  FERRITIN 282  TIBC 185*  IRON 51   Sepsis Labs: Recent Labs  Lab 09/05/20 0204 09/05/20 0534  LATICACIDVEN 1.4 1.7    Recent Results (from the past 240 hour(s))  Resp  Panel by RT-PCR (Flu A&B, Covid) Nasopharyngeal Swab     Status: None   Collection Time: 09/05/20  5:16 AM   Specimen: Nasopharyngeal Swab; Nasopharyngeal(NP) swabs in vial transport medium  Result Value Ref Range Status   SARS Coronavirus 2 by RT PCR NEGATIVE NEGATIVE Final    Comment: (NOTE) SARS-CoV-2 target nucleic acids are NOT DETECTED.  The SARS-CoV-2 RNA is generally detectable in upper respiratory specimens during the acute phase of infection. The lowest concentration of SARS-CoV-2 viral copies this assay can detect is 138 copies/mL. A negative result does not preclude SARS-Cov-2 infection and should not be used as the sole basis for treatment or other patient management decisions. A negative result may occur with  improper specimen collection/handling, submission of specimen other than nasopharyngeal swab, presence of viral mutation(s) within the areas targeted by this assay, and inadequate number of viral copies(<138 copies/mL). A negative result must be combined with clinical observations, patient history, and epidemiological information. The expected result is Negative.  Fact Sheet for Patients:  EntrepreneurPulse.com.au  Fact Sheet for Healthcare Providers:  IncredibleEmployment.be  This test is no t yet approved or cleared by the [place] FDA and  has been authorized for detection and/or diagnosis of SARS-CoV-2 by FDA under an Emergency Use Authorization (EUA). This EUA will remain  in effect (meaning this test can be used) for the duration of the COVID-19 declaration under Section 564(b)(1) of the Act, 21 U.S.C.section 360bbb-3(b)(1), unless the authorization is terminated  or revoked sooner.       Influenza A by PCR NEGATIVE NEGATIVE Final   Influenza B by PCR NEGATIVE NEGATIVE Final    Comment: (NOTE) The Xpert Xpress SARS-CoV-2/FLU/RSV plus assay is intended as an aid in the diagnosis of influenza from Nasopharyngeal  swab specimens and should not be used as a sole basis for treatment. Nasal washings and aspirates are unacceptable for Xpert Xpress SARS-CoV-2/FLU/RSV testing.  Fact Sheet for Patients: EntrepreneurPulse.com.au  Fact Sheet for Healthcare Providers: IncredibleEmployment.be  This test is not yet approved or cleared by the [place] FDA and has been authorized for detection and/or diagnosis of SARS-CoV-2  by FDA under an Emergency Use Authorization (EUA). This EUA will remain in effect (meaning this test can be used) for the duration of the COVID-19 declaration under Section 564(b)(1) of the Act, 21 U.S.C. section 360bbb-3(b)(1), unless the authorization is terminated or revoked.  Performed at [facility], [address] [place], [place] [address]   Blood culture (routine x 2)     Status: Abnormal (Preliminary result)   Collection Time: 09/05/20  6:10 AM   Specimen: BLOOD  Result Value Ref Range Status   Specimen Description   Final    BLOOD LEFT WRIST Performed at [facility], [address], [place], [place] [address]    Special Requests   Final    BOTTLES DRAWN AEROBIC AND ANAEROBIC Blood Culture adequate volume Performed at [facility], [address] [place], [place] [address]    Culture  Setup Time   Final    GRAM POSITIVE COCCI IN CHAINS GRAM NEGATIVE RODS AEROBIC AND ANAEROBIC BOTTLES Gram Stain Report Called to,Read Back By and Verified With: EVANS,H ON 09/05/20 AT 2340 BY LOY,C Performed at [facility] Performed at [facility], [address], [place], [place] [address]    Culture ENTEROCOCCUS FAECALIS PSEUDOMONAS AERUGINOSA  (A)  Final   Report Status PENDING  Incomplete  Blood culture (routine x 2)     Status: Abnormal (Preliminary result)   Collection Time: 09/05/20  6:12 AM   Specimen: BLOOD  Result Value Ref Range Status   Specimen Description   Final    BLOOD RIGHT ANTECUBITAL Performed at [facility], [address] [place], [place] [address]    Special Requests   Final    BOTTLES DRAWN AEROBIC AND ANAEROBIC Blood Culture adequate volume Performed at [facility], [address] [place], [place] [address]    Culture  Setup Time   Final    GRAM POSITIVE COCCI IN CHAINS GRAM NEGATIVE RODS BOTH ANAEROBIC AND AEROBIC BOTTLES Gram Stain Report Called to,Read Back By and Verified With: EVANS,H ON 09/05/20 AT 2340 BY LOY,C Performed at [facility] to follow Performed at [facility], [address] [place], [place] [address]    Culture (A)  Final    ENTEROCOCCUS FAECALIS PSEUDOMONAS AERUGINOSA SUSCEPTIBILITIES TO FOLLOW CULTURE REINCUBATED FOR BETTER GROWTH Performed at [facility], [place] [address], [name],  [address]    Report Status PENDING  Incomplete  Blood Culture ID Panel (Reflexed)     Status: Abnormal   Collection Time: 09/05/20  6:12 AM  Result Value Ref Range Status   Enterococcus faecalis DETECTED (A) NOT DETECTED Final    Comment: CRITICAL RESULT CALLED TO, READ BACK BY AND VERIFIED WITH: H EVANS RN 09/06/20 0432 JDW    Enterococcus Faecium NOT DETECTED NOT DETECTED Final   Listeria monocytogenes NOT DETECTED NOT DETECTED Final   Staphylococcus species NOT DETECTED NOT DETECTED Final   Staphylococcus aureus (BCID) NOT DETECTED NOT DETECTED Final   Staphylococcus epidermidis NOT DETECTED NOT DETECTED Final   Staphylococcus lugdunensis NOT DETECTED NOT DETECTED Final   Streptococcus species NOT DETECTED NOT DETECTED Final   Streptococcus agalactiae NOT DETECTED NOT DETECTED Final   Streptococcus pneumoniae NOT DETECTED NOT DETECTED Final   Streptococcus pyogenes NOT DETECTED NOT DETECTED Final   A.calcoaceticus-baumannii NOT DETECTED NOT DETECTED Final   Bacteroides fragilis NOT DETECTED NOT DETECTED Final   Enterobacterales DETECTED (A) NOT DETECTED Final    Comment: Enterobacterales represent a large order of gram negative bacteria,  not a single organism. Refer to culture for further identification. CRITICAL RESULT CALLED TO, READ BACK BY AND VERIFIED WITH: H EVANS RN 09/06/20 0432 JDW    Enterobacter cloacae complex NOT DETECTED NOT DETECTED Final   Escherichia coli NOT DETECTED NOT DETECTED Final   Klebsiella aerogenes NOT DETECTED NOT DETECTED Final   Klebsiella oxytoca NOT DETECTED NOT DETECTED Final   Klebsiella pneumoniae NOT DETECTED NOT DETECTED Final   Proteus species NOT DETECTED NOT DETECTED Final   Salmonella species NOT DETECTED NOT DETECTED Final   Serratia marcescens NOT DETECTED NOT DETECTED Final   Haemophilus influenzae NOT DETECTED NOT DETECTED Final   Neisseria meningitidis NOT DETECTED NOT DETECTED Final   Pseudomonas aeruginosa DETECTED (A) NOT DETECTED Final    Comment: CRITICAL RESULT CALLED TO, READ BACK BY AND VERIFIED WITH: H EVANS RN 09/06/20 0432 JDW    Stenotrophomonas maltophilia NOT DETECTED NOT DETECTED Final   Candida albicans NOT DETECTED NOT DETECTED Final   Candida auris NOT DETECTED NOT DETECTED Final   Candida glabrata NOT DETECTED NOT DETECTED Final   Candida krusei NOT DETECTED NOT DETECTED Final   Candida parapsilosis NOT DETECTED NOT DETECTED Final   Candida tropicalis NOT DETECTED NOT DETECTED Final   Cryptococcus neoformans/gattii NOT DETECTED NOT DETECTED Final   CTX-M ESBL NOT DETECTED NOT DETECTED Final   Carbapenem resistance IMP NOT DETECTED NOT DETECTED Final   Carbapenem resistance KPC NOT DETECTED NOT DETECTED Final   Carbapenem resistance NDM NOT DETECTED NOT DETECTED Final   Carbapenem resist OXA 48 LIKE NOT DETECTED NOT DETECTED Final   Vancomycin resistance NOT DETECTED NOT DETECTED Final   Carbapenem resistance VIM NOT DETECTED NOT DETECTED Final    Comment: Performed at [facility], [address] [place], [place] [address]  Culture, blood (routine x 2)     Status: None (Preliminary result)   Collection Time: 09/06/20  5:30 AM   Specimen: BLOOD  RIGHT HAND  Result Value Ref Range Status   Specimen Description BLOOD RIGHT HAND  Final   Special Requests   Final    BOTTLES DRAWN AEROBIC ONLY Blood Culture results may not be optimal due to an inadequate volume of blood received in culture bottles   Culture   Final    NO GROWTH 2 DAYS Performed at [facility], [address], [place], [place] [address]    Report Status PENDING  Incomplete  Culture, blood (routine x 2)     Status: None (Preliminary result)   Collection Time: 09/06/20  5:40 AM   Specimen: BLOOD RIGHT HAND  Result Value Ref Range Status   Specimen Description BLOOD RIGHT HAND  Final   Special Requests   Final    BOTTLES DRAWN AEROBIC AND ANAEROBIC Blood Culture results may not be optimal due to an inadequate volume of blood received in culture bottles   Culture   Final    NO GROWTH 2 DAYS Performed at [facility], [address], [place], [place] [address]    Report Status PENDING  Incomplete         Radiology Studies: ECHOCARDIOGRAM COMPLETE  Result Date: 09/06/2020    ECHOCARDIOGRAM REPORT   Patient Name:   Jordan Ward Date of Exam: 09/06/2020 Medical Rec #:  [id number]    Height:       63.0 in Accession #:    [id number]   Weight:       108.0 lb Date of Birth:  [birth year]    BSA:  1.488 m Patient Age:    57 years     BP:           134/70 mmHg Patient Gender: M            HR:           73 bpm. Exam Location:  [facility] Procedure: 2D Echo Indications:    Bacteremia 790.7 / R78.81  History:        Patient has prior history of Echocardiogram examinations, most                 recent 06/15/2017. CHF, Stroke, Signs/Symptoms:Bacteremia; Risk                 Factors:Hypertension and Former Smoker. Pulmonary Fibroisis,                 Pneumonia due to Covid.  Sonographer:    Leavy Cella RDCS (AE) Referring Phys: [id number] [place]  1. Left ventricular ejection fraction, by estimation, is 50 to 55%. The left ventricle has low normal function. The  left ventricle demonstrates regional Bucknam motion abnormalities (see scoring diagram/findings for description). There is mild left ventricular hypertrophy. Left ventricular diastolic parameters are consistent with Grade I diastolic dysfunction (impaired relaxation).  2. Right ventricular systolic function is mildly reduced. The right ventricular size is normal. There is normal pulmonary artery systolic pressure. The estimated right ventricular systolic pressure is 63.8 mmHg.  3. The mitral valve is grossly normal. Trivial mitral valve regurgitation.  4. The aortic valve is tricuspid. There is moderate calcification of the aortic valve. Aortic valve regurgitation is not visualized. Mild to moderate aortic valve sclerosis/calcification is present, without any evidence of aortic stenosis.  5. The inferior vena cava is normal in size with greater than 50% respiratory variability, suggesting right atrial pressure of 3 mmHg.  6. No obvious valvular vegetations. FINDINGS  Left Ventricle: Left ventricular ejection fraction, by estimation, is 50 to 55%. The left ventricle has low normal function. The left ventricle demonstrates regional Scheibel motion abnormalities. The left ventricular internal cavity size was normal in size. There is mild left ventricular hypertrophy. Left ventricular diastolic parameters are consistent with Grade I diastolic dysfunction (impaired relaxation).  LV Stair Scoring: The posterior Rachels is hypokinetic. The antero-lateral Thoen, entire septum, apical lateral segment, and apex are normal. Right Ventricle: The right ventricular size is normal. No increase in right ventricular Leeman thickness. Right ventricular systolic function is mildly reduced. There is normal pulmonary artery systolic pressure. The tricuspid regurgitant velocity is 2.51 m/s, and with an assumed right atrial pressure of 3 mmHg, the estimated right ventricular systolic pressure is 75.6 mmHg. Left Atrium: Left atrial size was normal in  size. Right Atrium: Right atrial size was normal in size. Pericardium: There is no evidence of pericardial effusion. Mitral Valve: The mitral valve is grossly normal. Trivial mitral valve regurgitation. Tricuspid Valve: The tricuspid valve is grossly normal. Tricuspid valve regurgitation is mild. Aortic Valve: The aortic valve is tricuspid. There is moderate calcification of the aortic valve. There is mild aortic valve annular calcification. Aortic valve regurgitation is not visualized. Mild to moderate aortic valve sclerosis/calcification is present, without any evidence of aortic stenosis. Pulmonic Valve: The pulmonic valve was grossly normal. Pulmonic valve regurgitation is trivial. Aorta: The aortic root is normal in size and structure. Venous: The inferior vena cava is normal in size with greater than 50% respiratory variability, suggesting right atrial pressure of 3 mmHg. IAS/Shunts: No atrial  level shunt detected by color flow Doppler.  LEFT VENTRICLE PLAX 2D LVIDd:         3.95 cm  Diastology LVIDs:         3.68 cm  LV e' medial:    3.59 cm/s LV PW:         1.20 cm  LV E/e' medial:  14.3 LV IVS:        1.24 cm  LV e' lateral:   4.03 cm/s LVOT diam:     2.00 cm  LV E/e' lateral: 12.8 LVOT Area:     3.14 cm  RIGHT VENTRICLE RV S prime:     13.80 cm/s TAPSE (M-mode): 2.0 cm LEFT ATRIUM           Index       RIGHT ATRIUM           Index LA diam:      3.40 cm 2.28 cm/m  RA Area:     11.90 cm LA Vol (A2C): 55.6 ml 37.36 ml/m RA Volume:   28.30 ml  19.02 ml/m LA Vol (A4C): 23.6 ml 15.86 ml/m  MITRAL VALVE               TRICUSPID VALVE MV Area (PHT): 3.06 cm    TR Peak grad:   25.2 mmHg MV Decel Time: 248 msec    TR Vmax:        251.00 cm/s MV E velocity: 51.40 cm/s MV A velocity: 75.80 cm/s  SHUNTS MV E/A ratio:  0.68        Systemic Diam: 2.00 cm Rozann Lesches MD Electronically signed by Rozann Lesches MD Signature Date/Time: 09/06/2020/4:32:25 PM    Final         Scheduled Meds: . ascorbic acid   500 mg Oral BID  . heparin  5,000 Units Subcutaneous Q8H  . HYDROcodone-acetaminophen  1 tablet Oral BID  . levothyroxine  75 mcg Oral QAC breakfast  . linagliptin  5 mg Oral Daily  .  morphine injection  4 mg Intravenous Once  . multivitamin with minerals  1 tablet Oral Daily   Continuous Infusions: . sodium chloride 1,000 mL (09/07/20 1801)  . piperacillin-tazobactam (ZOSYN)  IV 3.375 g (09/08/20 0604)     LOS: 3 days    Time spent: 30 minutes    Barb Merino, MD [facility] Pager [phone]

## 2020-09-09 LAB — BASIC METABOLIC PANEL
Anion gap: 7 (ref 5–15)
BUN: 34 mg/dL — ABNORMAL HIGH (ref 8–23)
CO2: 28 mmol/L (ref 22–32)
Calcium: 8.4 mg/dL — ABNORMAL LOW (ref 8.9–10.3)
Chloride: 108 mmol/L (ref 98–111)
Creatinine, Ser: 3.05 mg/dL — ABNORMAL HIGH (ref 0.61–1.24)
GFR, Estimated: 20 mL/min — ABNORMAL LOW (ref >60)
Glucose, Bld: 107 mg/dL — ABNORMAL HIGH (ref 70–99)
Potassium: 4.2 mmol/L (ref 3.5–5.1)
Sodium: 143 mmol/L (ref 135–145)

## 2020-09-09 LAB — CBC WITH DIFFERENTIAL/PLATELET
Abs Immature Granulocytes: 0.02 10*3/uL (ref 0.00–0.07)
Basophils Absolute: 0 10*3/uL (ref 0.0–0.1)
Basophils Relative: 1 %
Eosinophils Absolute: 0.2 10*3/uL (ref 0.0–0.5)
Eosinophils Relative: 3 %
HCT: 28 % — ABNORMAL LOW (ref 39.0–52.0)
Hemoglobin: 8.2 g/dL — ABNORMAL LOW (ref 13.0–17.0)
Immature Granulocytes: 0 %
Lymphocytes Relative: 33 %
Lymphs Abs: 2.1 10*3/uL (ref 0.7–4.0)
MCH: 29.4 pg (ref 26.0–34.0)
MCHC: 29.3 g/dL — ABNORMAL LOW (ref 30.0–36.0)
MCV: 100.4 fL — ABNORMAL HIGH (ref 80.0–100.0)
Monocytes Absolute: 0.5 10*3/uL (ref 0.1–1.0)
Monocytes Relative: 8 %
Neutro Abs: 3.5 10*3/uL (ref 1.7–7.7)
Neutrophils Relative %: 55 %
Platelets: 230 10*3/uL (ref 150–400)
RBC: 2.79 MIL/uL — ABNORMAL LOW (ref 4.22–5.81)
RDW: 15.1 % (ref 11.5–15.5)
WBC: 6.4 10*3/uL (ref 4.0–10.5)
nRBC: 0 % (ref 0.0–0.2)

## 2020-09-09 NOTE — Progress Notes (Signed)
PROGRESS NOTE    [place]  ASN:[id number] DOB: 1941-01-04 DOA: 09/05/2020 PCP: Noreene Larsson, NP    Brief Narrative:  79 year old gentleman with extensive medical comorbidities including coronary artery disease, multiple sclerosis and debility, paroxysmal A. fib, recent history of COVID with respiratory failure and now on oxygen, congenital atrophic right kidney, stone in the left kidney, neurogenic bladder with chronic suprapubic catheter admitted to the hospital secondary to sepsis. Patient underwent cystoscopy, double-J stent removal and suprapubic catheter exchange on 12/7, very next day started having fever and left flank pain so came to the ER where he was found with fever, tachycardia, WC count of 18.4, creatinine 3.39.  He was treated with IV fluids and antibiotics and taken to the OR for left ureteric stent placement. 12/9, patient growing Enterococcus faecalis and Pseudomonas bacteremia.  Assessment & Plan:   Active Problems:   Obstructive uropathy  Sepsis present on admission secondary to obstructive uropathy/infected kidney stone: Enterococcus faecalis and Pseudomonas bacteremia. Status post cystoscopy and left ureteric stent placement.  Clinically stabilizing. Continue Zosyn until final cultures are available for Enterococcus and Pseudomonas. 2D echocardiogram with no obvious endocarditis. Repeat blood cultures with no growth.  With rapid defervescence of bacteremia, as per ID recommendation no need for TEE. Postsurgical management as per urology.  Acute on chronic renal failure: Baseline stage IV chronic kidney disease, atrophic right kidney. Known baseline creatinine about 1.9-2. Likely from sepsis and obstruction. Urine output is adequate.  We will keep on maintenance IV fluid.  Encourage oral intake.  Type 2 diabetes: On Tradjenta at home.  Well controlled.  Remains on sliding scale insulin.  Multiple sclerosis/neurogenic bladder: Will benefit with mobility.  PT  OT at home.  Hypokalemia/hypomagnesemia: Replaced.  Adequate.  Hypothyroidism: On Synthroid that was continued on same doses.  COPD with chronic hypoxemic respiratory failure on 2 L oxygen at home: Stable.  Anemia of chronic kidney disease: Multifactorial anemia. Hemoglobin 8.  Iron level is adequate.  B12 is adequate.  DVT prophylaxis: heparin injection 5,000 Units Start: 09/05/20 0830   Code Status: Full code Family Communication: None at the bedside Disposition Plan: Status is: Inpatient  Remains inpatient appropriate because:Persistent severe electrolyte disturbances and Inpatient level of care appropriate due to severity of illness   Dispo: The patient is from: Home              Anticipated d/c is to: Home with home health              Anticipated d/c date is: 2 days.  Pending microbiology culture and sensitivity.              Patient currently is not medically stable to d/c.         Consultants:   Urology  Nephrology  Procedures:   Cystoscopy, left ureteric stent placed  Antimicrobials:  Anti-infectives (From admission, onward)   Start     Dose/Rate Route Frequency Ordered Stop   09/07/20 0000  cefTRIAXone (ROCEPHIN) 2 g in sodium chloride 0.9 % 100 mL IVPB  Status:  Discontinued        2 g 200 mL/hr over 30 Minutes Intravenous Every 24 hours 09/06/20 0729 09/06/20 0730   09/06/20 1800  piperacillin-tazobactam (ZOSYN) IVPB 3.375 g        3.375 g 12.5 mL/hr over 240 Minutes Intravenous Every 12 hours 09/06/20 0805     09/06/20 0830  piperacillin-tazobactam (ZOSYN) IVPB 3.375 g        3.375 g  100 mL/hr over 30 Minutes Intravenous  Once 09/06/20 0757 09/06/20 0939   09/06/20 0730  cefTRIAXone (ROCEPHIN) 2 g in sodium chloride 0.9 % 100 mL IVPB  Status:  Discontinued        2 g 200 mL/hr over 30 Minutes Intravenous Every 24 hours 09/06/20 0730 09/06/20 0757   09/06/20 0000  cefTRIAXone (ROCEPHIN) 1 g in sodium chloride 0.9 % 100 mL IVPB  Status:   Discontinued        1 g 200 mL/hr over 30 Minutes Intravenous Every 24 hours 09/05/20 0815 09/06/20 0729   09/05/20 2200  cephALEXin (KEFLEX) capsule 250 mg  Status:  Discontinued        250 mg Oral Daily at bedtime 09/05/20 0845 09/05/20 0852   09/05/20 0600  cefTRIAXone (ROCEPHIN) 1 g in sodium chloride 0.9 % 100 mL IVPB        1 g 200 mL/hr over 30 Minutes Intravenous  Once 09/05/20 0545 09/05/20 0650         Subjective: Seen and examined.  Afebrile overnight.  Pain is well controlled.  Appetite is fair.  Suprapubic catheter keeps leaking around.  Objective: Vitals:   09/08/20 0623 09/08/20 1456 09/08/20 2157 09/09/20 0700  BP: 119/69 110/80 124/74 120/76  Pulse: 64 83 69 70  Resp: 20 18 16 16   Temp: 97.7 F (36.5 C) 98.1 F (36.7 C) 98.7 F (37.1 C) 98.7 F (37.1 C)  TempSrc: Oral Oral Oral Oral  SpO2: 99% 100% 100% 100%  Weight: 51.4 kg     Height:        Intake/Output Summary (Last 24 hours) at 09/09/2020 1030 Last data filed at 09/09/2020 0700 Gross per 24 hour  Intake 1505.27 ml  Output 2000 ml  Net -494.73 ml   Filed Weights   09/05/20 0139 09/08/20 0623  Weight: 49 kg 51.4 kg    Examination:  General exam: Appears calm and comfortable  Chronically sick looking and frail gentleman sitting at the edge of the bed. Thin and frail, muscle wasting. Respiratory system: Clear to auscultation. Respiratory effort normal. Cardiovascular system: S1 & S2 heard, RRR. No pedal edema. Gastrointestinal system: Soft.  Nontender.  Bowel sounds present. Suprapubic catheter in place, draining clear urine. He has leakage around suprapubic catheter. Central nervous system: Alert and oriented. No focal neurological deficits. Extremities: Generalized weakness. Skin: No rashes, lesions or ulcers Psychiatry: Judgement and insight appear normal. Mood & affect normal.    Data Reviewed: I have personally reviewed following labs and imaging studies  CBC: Recent Labs  Lab  09/05/20 0204 09/05/20 0812 09/06/20 0429 09/07/20 0435 09/08/20 0431 09/09/20 0512  WBC 18.4* 20.1* 17.9* 11.2* 7.3 6.4  NEUTROABS 15.7*  --   --  8.3* 4.4 3.5  HGB 9.6* 8.8* 8.5* 7.2* 8.1* 8.2*  HCT 30.9* 28.3* 28.1* 23.8* 26.7* 28.0*  MCV 96.6 97.3 99.3 98.8 96.7 100.4*  PLT 296 284 232 203 223 956   Basic Metabolic Panel: Recent Labs  Lab 09/05/20 0534 09/05/20 0812 09/06/20 0429 09/07/20 0435 09/08/20 0431 09/09/20 0512  NA 135  --  140 138 137 143  K 3.3*  --  3.4* 3.3* 3.3* 4.2  CL 100  --  105 102 101 108  CO2 24  --  25 26 26 28   GLUCOSE 154*  --  89 97 92 107*  BUN 40*  --  42* 43* 35* 34*  CREATININE 3.54* 3.67* 3.69* 3.70* 3.06* 3.05*  CALCIUM 8.5*  --  8.4* 7.9* 8.1* 8.4*  MG  --  1.6*  --  1.7  --   --   PHOS  --   --   --  4.5  --   --    GFR: Estimated Creatinine Clearance: 14.7 mL/min (A) (by C-G formula based on SCr of 3.05 mg/dL (H)). Liver Function Tests: No results for input(s): AST, ALT, ALKPHOS, BILITOT, PROT, ALBUMIN in the last 168 hours. No results for input(s): LIPASE, AMYLASE in the last 168 hours. No results for input(s): AMMONIA in the last 168 hours. Coagulation Profile: No results for input(s): INR, PROTIME in the last 168 hours. Cardiac Enzymes: No results for input(s): CKTOTAL, CKMB, CKMBINDEX, TROPONINI in the last 168 hours. BNP (last 3 results) No results for input(s): PROBNP in the last 8760 hours. HbA1C: No results for input(s): HGBA1C in the last 72 hours. CBG: No results for input(s): GLUCAP in the last 168 hours. Lipid Profile: No results for input(s): CHOL, HDL, LDLCALC, TRIG, CHOLHDL, LDLDIRECT in the last 72 hours. Thyroid Function Tests: No results for input(s): TSH, T4TOTAL, FREET4, T3FREE, THYROIDAB in the last 72 hours. Anemia Panel: Recent Labs    09/08/20 0023  VITAMINB12 405  FOLATE 16.2  FERRITIN 282  TIBC 185*  IRON 51   Sepsis Labs: Recent Labs  Lab 09/05/20 0204 09/05/20 0534  LATICACIDVEN 1.4  1.7    Recent Results (from the past 240 hour(s))  Resp Panel by RT-PCR (Flu A&B, Covid) Nasopharyngeal Swab     Status: None   Collection Time: 09/05/20  5:16 AM   Specimen: Nasopharyngeal Swab; Nasopharyngeal(NP) swabs in vial transport medium  Result Value Ref Range Status   SARS Coronavirus 2 by RT PCR NEGATIVE NEGATIVE Final    Comment: (NOTE) SARS-CoV-2 target nucleic acids are NOT DETECTED.  The SARS-CoV-2 RNA is generally detectable in upper respiratory specimens during the acute phase of infection. The lowest concentration of SARS-CoV-2 viral copies this assay can detect is 138 copies/mL. A negative result does not preclude SARS-Cov-2 infection and should not be used as the sole basis for treatment or other patient management decisions. A negative result may occur with  improper specimen collection/handling, submission of specimen other than nasopharyngeal swab, presence of viral mutation(s) within the areas targeted by this assay, and inadequate number of viral copies(<138 copies/mL). A negative result must be combined with clinical observations, patient history, and epidemiological information. The expected result is Negative.  Fact Sheet for Patients:  EntrepreneurPulse.com.au  Fact Sheet for Healthcare Providers:  IncredibleEmployment.be  This test is no t yet approved or cleared by the [place] FDA and  has been authorized for detection and/or diagnosis of SARS-CoV-2 by FDA under an Emergency Use Authorization (EUA). This EUA will remain  in effect (meaning this test can be used) for the duration of the COVID-19 declaration under Section 564(b)(1) of the Act, 21 U.S.C.section 360bbb-3(b)(1), unless the authorization is terminated  or revoked sooner.       Influenza A by PCR NEGATIVE NEGATIVE Final   Influenza B by PCR NEGATIVE NEGATIVE Final    Comment: (NOTE) The Xpert Xpress SARS-CoV-2/FLU/RSV plus assay is intended  as an aid in the diagnosis of influenza from Nasopharyngeal swab specimens and should not be used as a sole basis for treatment. Nasal washings and aspirates are unacceptable for Xpert Xpress SARS-CoV-2/FLU/RSV testing.  Fact Sheet for Patients: EntrepreneurPulse.com.au  Fact Sheet for Healthcare Providers: IncredibleEmployment.be  This test is not yet approved or cleared by the [place]  States FDA and has been authorized for detection and/or diagnosis of SARS-CoV-2 by FDA under an Emergency Use Authorization (EUA). This EUA will remain in effect (meaning this test can be used) for the duration of the COVID-19 declaration under Section 564(b)(1) of the Act, 21 U.S.C. section 360bbb-3(b)(1), unless the authorization is terminated or revoked.  Performed at [facility], [address] [place], [place] [address]   Blood culture (routine x 2)     Status: Abnormal   Collection Time: 09/05/20  6:10 AM   Specimen: BLOOD  Result Value Ref Range Status   Specimen Description   Final    BLOOD LEFT WRIST Performed at [facility], [address], [place], [place] [address]    Special Requests   Final    BOTTLES DRAWN AEROBIC AND ANAEROBIC Blood Culture adequate volume Performed at [facility], [address], [place], [place] [address]    Culture  Setup Time   Final    GRAM POSITIVE COCCI IN CHAINS GRAM NEGATIVE RODS AEROBIC AND ANAEROBIC BOTTLES Gram Stain Report Called to,Read Back By and Verified With: EVANS,H ON 09/05/20 AT 2340 BY LOY,C Performed at [facility] Performed at [facility], [address] [place], [place] [address]    Culture (A)  Final    ENTEROCOCCUS FAECALIS PSEUDOMONAS AERUGINOSA SUSCEPTIBILITIES PERFORMED ON PREVIOUS CULTURE WITHIN THE LAST 5 DAYS. Performed at [facility], [place] [address] [place], [place] [address]    Report Status 09/08/2020 FINAL  Final  Blood culture (routine x 2)     Status: Abnormal  (Preliminary result)   Collection Time: 09/05/20  6:12 AM   Specimen: BLOOD  Result Value Ref Range Status   Specimen Description   Final    BLOOD RIGHT ANTECUBITAL Performed at [facility], [address] [place], [place] [address]    Special Requests   Final    BOTTLES DRAWN AEROBIC AND ANAEROBIC Blood Culture adequate volume Performed at [facility], [address] [place], [place] [address]    Culture  Setup Time   Final    GRAM POSITIVE COCCI IN CHAINS GRAM NEGATIVE RODS BOTH ANAEROBIC AND AEROBIC BOTTLES Gram Stain Report Called to,Read Back By and Verified With: EVANS,H ON 09/05/20 AT 2340 BY LOY,C Performed at [place] ID to follow Performed at [facility], [address] [place], [place] [address]    Culture ENTEROCOCCUS FAECALIS PSEUDOMONAS AERUGINOSA  (A)  Final   Report Status PENDING  Incomplete  Blood Culture ID Panel (Reflexed)     Status: Abnormal   Collection Time: 09/05/20  6:12 AM  Result Value Ref Range Status   Enterococcus faecalis DETECTED (A) NOT DETECTED Final    Comment: CRITICAL RESULT CALLED TO, READ BACK BY AND VERIFIED WITH: H EVANS RN 09/06/20 0432 JDW    Enterococcus Faecium NOT DETECTED NOT DETECTED Final   Listeria monocytogenes NOT DETECTED NOT DETECTED Final   Staphylococcus species NOT DETECTED NOT DETECTED Final   Staphylococcus aureus (BCID) NOT DETECTED NOT DETECTED Final   Staphylococcus epidermidis NOT DETECTED NOT DETECTED Final   Staphylococcus lugdunensis NOT DETECTED NOT DETECTED Final   Streptococcus species NOT DETECTED NOT DETECTED Final   Streptococcus agalactiae NOT DETECTED NOT DETECTED Final   Streptococcus pneumoniae NOT DETECTED NOT DETECTED Final   Streptococcus pyogenes NOT DETECTED NOT DETECTED Final   A.calcoaceticus-baumannii NOT DETECTED NOT DETECTED Final   Bacteroides fragilis NOT DETECTED NOT DETECTED Final   Enterobacterales DETECTED (A) NOT DETECTED Final  Comment: Enterobacterales  represent a large order of gram negative bacteria, not a single organism. Refer to culture for further identification. CRITICAL RESULT CALLED TO, READ BACK BY AND VERIFIED WITH: H EVANS RN 09/06/20 0432 JDW    Enterobacter cloacae complex NOT DETECTED NOT DETECTED Final   Escherichia coli NOT DETECTED NOT DETECTED Final   Klebsiella aerogenes NOT DETECTED NOT DETECTED Final   Klebsiella oxytoca NOT DETECTED NOT DETECTED Final   Klebsiella pneumoniae NOT DETECTED NOT DETECTED Final   Proteus species NOT DETECTED NOT DETECTED Final   Salmonella species NOT DETECTED NOT DETECTED Final   Serratia marcescens NOT DETECTED NOT DETECTED Final   Haemophilus influenzae NOT DETECTED NOT DETECTED Final   Neisseria meningitidis NOT DETECTED NOT DETECTED Final   Pseudomonas aeruginosa DETECTED (A) NOT DETECTED Final    Comment: CRITICAL RESULT CALLED TO, READ BACK BY AND VERIFIED WITH: H EVANS RN 09/06/20 0432 JDW    Stenotrophomonas maltophilia NOT DETECTED NOT DETECTED Final   Candida albicans NOT DETECTED NOT DETECTED Final   Candida auris NOT DETECTED NOT DETECTED Final   Candida glabrata NOT DETECTED NOT DETECTED Final   Candida krusei NOT DETECTED NOT DETECTED Final   Candida parapsilosis NOT DETECTED NOT DETECTED Final   Candida tropicalis NOT DETECTED NOT DETECTED Final   Cryptococcus neoformans/gattii NOT DETECTED NOT DETECTED Final   CTX-M ESBL NOT DETECTED NOT DETECTED Final   Carbapenem resistance IMP NOT DETECTED NOT DETECTED Final   Carbapenem resistance KPC NOT DETECTED NOT DETECTED Final   Carbapenem resistance NDM NOT DETECTED NOT DETECTED Final   Carbapenem resist OXA 48 LIKE NOT DETECTED NOT DETECTED Final   Vancomycin resistance NOT DETECTED NOT DETECTED Final   Carbapenem resistance VIM NOT DETECTED NOT DETECTED Final    Comment: Performed at [facility], [address], [place], [place] [address]  Culture, blood (routine x 2)     Status: None (Preliminary result)    Collection Time: 09/06/20  5:30 AM   Specimen: BLOOD RIGHT HAND  Result Value Ref Range Status   Specimen Description BLOOD RIGHT HAND  Final   Special Requests   Final    BOTTLES DRAWN AEROBIC ONLY Blood Culture results may not be optimal due to an inadequate volume of blood received in culture bottles   Culture   Final    NO GROWTH 3 DAYS Performed at [facility], [address] [place], [place] [address]    Report Status PENDING  Incomplete  Culture, blood (routine x 2)     Status: None (Preliminary result)   Collection Time: 09/06/20  5:40 AM   Specimen: BLOOD RIGHT HAND  Result Value Ref Range Status   Specimen Description BLOOD RIGHT HAND  Final   Special Requests   Final    BOTTLES DRAWN AEROBIC AND ANAEROBIC Blood Culture results may not be optimal due to an inadequate volume of blood received in culture bottles   Culture   Final    NO GROWTH 3 DAYS Performed at [facility], [address] [place], [place] [address]    Report Status PENDING  Incomplete         Radiology Studies: No results found.      Scheduled Meds: . ascorbic acid  500 mg Oral BID  . heparin  5,000 Units Subcutaneous Q8H  . HYDROcodone-acetaminophen  1 tablet Oral BID  . levothyroxine  75 mcg Oral QAC breakfast  . linagliptin  5 mg Oral Daily  .  morphine injection  4 mg Intravenous Once  . multivitamin with minerals  1 tablet Oral Daily   Continuous Infusions: . sodium chloride 1,000 mL (09/07/20 1801)  . piperacillin-tazobactam (ZOSYN)  IV 3.375 g (09/09/20 0537)     LOS: 4 days    Time spent: 30 minutes    Barb Merino, MD [facility] Pager [phone]

## 2020-09-09 NOTE — Progress Notes (Signed)
Pharmacy Antibiotic Note  [place] Ward is a 79 y.o. male admitted on 09/05/2020 with bacteremia.  Pharmacy has been consulted for Zosyn dosing. Acute on chronic renal failure. Patient is clinically stabilizing. Has a polymicrobial bacteremia/sepsis, enterococcal faecalis and pseudomonas in setting prior urologic procedure and urinary obstruction . 2D echocardiogram with no obvious endocarditis. Repeat BCX with ngtd. Patient has had a rapid defervescence of bacteremia.   Plan: Contine Zosyn 3.375g IV every 12 hours. Monitor labs, c/s, and patient improvement  Height: 5\' 3"  (160 cm) Weight: 51.4 kg (113 lb 5.1 oz) IBW/kg (Calculated) : 56.9  Temp (24hrs), Avg:98.5 F (36.9 C), Min:98.1 F (36.7 C), Max:98.7 F (37.1 C)  Recent Labs  Lab 09/05/20 0204 09/05/20 0534 09/05/20 0812 09/06/20 0429 09/07/20 0435 09/08/20 0431 09/09/20 0512  WBC 18.4*  --  20.1* 17.9* 11.2* 7.3 6.4  CREATININE 3.39* 3.54* 3.67* 3.69* 3.70* 3.06* 3.05*  LATICACIDVEN 1.4 1.7  --   --   --   --   --     Estimated Creatinine Clearance: 14.7 mL/min (A) (by C-G formula based on SCr of 3.05 mg/dL (H)).    Allergies  Allergen Reactions  . Tetracyclines & Related Anaphylaxis and Rash  . Ciprofloxacin     Trouble swallowing unknown reaction according to wife     Antimicrobials this admission: Zosyn 12/9 >>  CTX 12/9 x 1   Microbiology results: 12/8 BCx: gram + cocci and gram - rods BCID: BCID: enterococcus faecalis, enterobacterales,  and pseudomonas 12/9 BCX: ngtd 12/8 UCx: multiple species. NTR  Thank you for allowing pharmacy to be a part of this patient's care.  Isac Sarna, BS [place], [place] Clinical Pharmacist Pager [phone] 09/09/2020 11:41 AM

## 2020-09-10 LAB — CBC WITH DIFFERENTIAL/PLATELET
Abs Immature Granulocytes: 0.03 10*3/uL (ref 0.00–0.07)
Basophils Absolute: 0 10*3/uL (ref 0.0–0.1)
Basophils Relative: 1 %
Eosinophils Absolute: 0.3 10*3/uL (ref 0.0–0.5)
Eosinophils Relative: 4 %
HCT: 27.4 % — ABNORMAL LOW (ref 39.0–52.0)
Hemoglobin: 8 g/dL — ABNORMAL LOW (ref 13.0–17.0)
Immature Granulocytes: 0 %
Lymphocytes Relative: 30 %
Lymphs Abs: 2.2 10*3/uL (ref 0.7–4.0)
MCH: 29.2 pg (ref 26.0–34.0)
MCHC: 29.2 g/dL — ABNORMAL LOW (ref 30.0–36.0)
MCV: 100 fL (ref 80.0–100.0)
Monocytes Absolute: 0.5 10*3/uL (ref 0.1–1.0)
Monocytes Relative: 6 %
Neutro Abs: 4.3 10*3/uL (ref 1.7–7.7)
Neutrophils Relative %: 59 %
Platelets: 247 10*3/uL (ref 150–400)
RBC: 2.74 MIL/uL — ABNORMAL LOW (ref 4.22–5.81)
RDW: 15.2 % (ref 11.5–15.5)
WBC: 7.2 10*3/uL (ref 4.0–10.5)
nRBC: 0 % (ref 0.0–0.2)

## 2020-09-10 LAB — CULTURE, BLOOD (ROUTINE X 2): Special Requests: ADEQUATE

## 2020-09-10 MED ORDER — AMOXICILLIN 250 MG PO CAPS
500.0000 mg | ORAL_CAPSULE | Freq: Two times a day (BID) | ORAL | Status: DC
  Administered 2020-09-10 – 2020-09-11 (×2): 500 mg via ORAL
  Filled 2020-09-10 (×2): qty 2

## 2020-09-10 MED ORDER — LINEZOLID 600 MG PO TABS
600.0000 mg | ORAL_TABLET | Freq: Two times a day (BID) | ORAL | Status: DC
  Filled 2020-09-10 (×4): qty 1

## 2020-09-10 MED ORDER — DARBEPOETIN ALFA 40 MCG/0.4ML IJ SOSY
40.0000 ug | PREFILLED_SYRINGE | INTRAMUSCULAR | Status: DC
  Filled 2020-09-10: qty 0.4

## 2020-09-10 MED ORDER — SODIUM CHLORIDE 0.9 % IV SOLN
500.0000 mg | INTRAVENOUS | Status: DC
  Administered 2020-09-10: 15:00:00 500 mg via INTRAVENOUS
  Filled 2020-09-10 (×3): qty 0.5

## 2020-09-10 NOTE — Plan of Care (Signed)

## 2020-09-10 NOTE — Progress Notes (Signed)
Physical Therapy Treatment Patient Details Name: [place] Ward MRN: [id number] DOB: 11-09-1940 Today's Date: 09/10/2020    History of Present Illness 79 year old gentleman with extensive medical comorbidities including coronary artery disease, multiple sclerosis and debility, paroxysmal A. fib, recent history of COVID with respiratory failure and now on oxygen, congenital atrophic right kidney, stone in the left kidney, neurogenic bladder with chronic suprapubic catheter admitted to the hospital secondary to sepsis.  Patient underwent cystoscopy, double-J stent removal and suprapubic catheter exchange on 12/7, very next day started having fever and left flank pain so came to the ER where he was found with fever, tachycardia, WC count of 18.4, creatinine 3.39.  He was treated with IV fluids and antibiotics and taken to the OR for left ureteric stent placement.09/06/2020    PT Comments    Patient seated EOB at beginning of session. Patient transfers to standing with min guard with RW and is unsteady upon standing. Patient ambulates with slow, labored cadence and is slightly unsteady with RW. Patient ambulates without loss of balance and is provided with min guard assist for safety/balance. Patient ends session seated in chair and is set up for breakfast. Patient will benefit from continued physical therapy in hospital and recommended venue below to increase strength, balance, endurance for safe ADLs and gait.    Follow Up Recommendations  Home health PT;Supervision/Assistance - 24 hour;Supervision for mobility/OOB     Equipment Recommendations  None recommended by PT    Recommendations for Other Services       Precautions / Restrictions Precautions Precautions: Fall Precaution Comments: one fall with left leg giving out on him; history of MS Restrictions Weight Bearing Restrictions: No    Mobility  Bed Mobility               General bed mobility comments: Patient seated EOB at  beginning of session  Transfers Overall transfer level: Needs assistance Equipment used: Rolling walker (2 wheeled) Transfers: Stand Pivot Transfers;Sit to/from Stand Sit to Stand: Min guard Stand pivot transfers: Min guard       General transfer comment: slightly unsteady upon standing with RW  Ambulation/Gait Ambulation/Gait assistance: Min guard Gait Distance (Feet): 100 Feet Assistive device: Rolling walker (2 wheeled) Gait Pattern/deviations: Step-through pattern;Decreased step length - right;Decreased step length - left;Decreased stride length;Trunk flexed;Narrow base of support Gait velocity: decreased   General Gait Details: slow, labored cadence on 2LO2, slightly unsteady   Stairs             Wheelchair Mobility    Modified Rankin (Stroke Patients Only)       Balance Overall balance assessment: History of Falls;Needs assistance Sitting-balance support: Single extremity supported;Feet supported Sitting balance-Leahy Scale: Good     Standing balance support: Bilateral upper extremity supported;During functional activity Standing balance-Leahy Scale: Fair Standing balance comment: fair with RW                            Cognition Arousal/Alertness: Awake/alert Behavior During Therapy: WFL for tasks assessed/performed Overall Cognitive Status: Within Functional Limits for tasks assessed                                        Exercises      General Comments        Pertinent Vitals/Pain Pain Assessment: No/denies pain    Home Living  Prior Function            PT Goals (current goals can now be found in the care plan section) Acute Rehab PT Goals Patient Stated Goal: Go home and continue HHPT. PT Goal Formulation: With patient Time For Goal Achievement: 09/20/20 Potential to Achieve Goals: Fair Progress towards PT goals: Progressing toward goals    Frequency    Min  3X/week      PT Plan Current plan remains appropriate    Co-evaluation              AM-PAC PT "6 Clicks" Mobility   Outcome Measure  Help needed turning from your back to your side while in a flat bed without using bedrails?: A Little Help needed moving from lying on your back to sitting on the side of a flat bed without using bedrails?: A Little Help needed moving to and from a bed to a chair (including a wheelchair)?: A Little Help needed standing up from a chair using your arms (e.g., wheelchair or bedside chair)?: A Little Help needed to walk in hospital room?: A Little Help needed climbing 3-5 steps with a railing? : A Lot 6 Click Score: 17    End of Session Equipment Utilized During Treatment: Oxygen Activity Tolerance: Patient limited by fatigue;Patient tolerated treatment well Patient left: in chair;with call bell/phone within reach Nurse Communication: Mobility status PT Visit Diagnosis: Unsteadiness on feet (R26.81);Other abnormalities of gait and mobility (R26.89);History of falling (Z91.81);Muscle weakness (generalized) (M62.81)     Time: 2130-8657 PT Time Calculation (min) (ACUTE ONLY): 15 min  Charges:  $Therapeutic Activity: 8-22 mins                     9:17 AM, 09/10/20 Mearl Latin PT, DPT Physical Therapist at [facility]

## 2020-09-10 NOTE — Progress Notes (Signed)
PROGRESS NOTE    Jordan Ward  FGH:[id number] DOB: 19-Jun-1941 DOA: 09/05/2020 PCP: Noreene Larsson, NP    Brief Narrative:  79 year old gentleman with extensive medical comorbidities including coronary artery disease, multiple sclerosis and debility, paroxysmal A. fib, recent history of COVID with respiratory failure and now on oxygen, congenital atrophic right kidney, stone in the left kidney, neurogenic bladder with chronic suprapubic catheter admitted to the hospital secondary to sepsis. Patient underwent cystoscopy, double-J stent removal and suprapubic catheter exchange on 12/7, very next day started having fever and left flank pain so came to the ER where he was found with fever, tachycardia, WC count of 18.4, creatinine 3.39.  He was treated with IV fluids and antibiotics and taken to the OR for left ureteric stent placement. 12/9, patient growing Enterococcus faecalis and Pseudomonas bacteremia.  Assessment & Plan:   Active Problems:   Obstructive uropathy  Sepsis present on admission secondary to obstructive uropathy/infected kidney stone: Enterococcus faecalis and Pseudomonas bacteremia. Status post cystoscopy and left ureteric stent placement.  Clinically stabilizing. Currently on Zosyn.  Final cultures available.  Will discuss with infectious disease about antibiotic choices. 2D echocardiogram with no obvious endocarditis. Repeat blood cultures with no growth.  With rapid defervescence of bacteremia, as per ID recommendation no need for TEE. Postsurgical management as per urology.  Acute on chronic renal failure: Baseline stage IV chronic kidney disease, atrophic right kidney. Known baseline creatinine about 1.9-2. Likely from sepsis and obstruction.  Recheck levels tomorrow morning. Urine output is adequate.  We will keep on maintenance IV fluid.  Encourage oral intake.  Type 2 diabetes: On Tradjenta at home.  Well controlled.  Remains on sliding scale insulin.  Multiple  sclerosis/neurogenic bladder: Will benefit with mobility.  PT OT at home.  Hypokalemia/hypomagnesemia: Replaced.  Adequate.  Recheck tomorrow morning.  Hypothyroidism: On Synthroid that was continued on same doses.  COPD with chronic hypoxemic respiratory failure on 2 L oxygen at home: Stable.  Anemia of chronic kidney disease: Multifactorial anemia. Hemoglobin 8.  Iron level is adequate.  B12 is adequate.  DVT prophylaxis: heparin injection 5,000 Units Start: 09/05/20 0830   Code Status: Full code Family Communication: None at the bedside Disposition Plan: Status is: Inpatient  Remains inpatient appropriate because:Persistent severe electrolyte disturbances and Inpatient level of care appropriate due to severity of illness   Dispo: The patient is from: Home              Anticipated d/c is to: Home with home health              Anticipated d/c date is: Anticipate tomorrow.              Patient currently is not medically stable to d/c.         Consultants:   Urology  Nephrology  Procedures:   Cystoscopy, left ureteric stent placed  Antimicrobials:  Anti-infectives (From admission, onward)   Start     Dose/Rate Route Frequency Ordered Stop   09/07/20 0000  cefTRIAXone (ROCEPHIN) 2 g in sodium chloride 0.9 % 100 mL IVPB  Status:  Discontinued        2 g 200 mL/hr over 30 Minutes Intravenous Every 24 hours 09/06/20 0729 09/06/20 0730   09/06/20 1800  piperacillin-tazobactam (ZOSYN) IVPB 3.375 g        3.375 g 12.5 mL/hr over 240 Minutes Intravenous Every 12 hours 09/06/20 0805     09/06/20 0830  piperacillin-tazobactam (ZOSYN) IVPB 3.375 g  3.375 g 100 mL/hr over 30 Minutes Intravenous  Once 09/06/20 0757 09/06/20 0939   09/06/20 0730  cefTRIAXone (ROCEPHIN) 2 g in sodium chloride 0.9 % 100 mL IVPB  Status:  Discontinued        2 g 200 mL/hr over 30 Minutes Intravenous Every 24 hours 09/06/20 0730 09/06/20 0757   09/06/20 0000  cefTRIAXone (ROCEPHIN) 1 g in  sodium chloride 0.9 % 100 mL IVPB  Status:  Discontinued        1 g 200 mL/hr over 30 Minutes Intravenous Every 24 hours 09/05/20 0815 09/06/20 0729   09/05/20 2200  cephALEXin (KEFLEX) capsule 250 mg  Status:  Discontinued        250 mg Oral Daily at bedtime 09/05/20 0845 09/05/20 0852   09/05/20 0600  cefTRIAXone (ROCEPHIN) 1 g in sodium chloride 0.9 % 100 mL IVPB        1 g 200 mL/hr over 30 Minutes Intravenous  Once 09/05/20 0545 09/05/20 0650         Subjective: No overnight events but his butt hurts and he had multiple episodes of diarrhea last night. If he continues to have diarrhea, will need to check for C. difficile.  Objective: Vitals:   09/08/20 2157 09/09/20 0700 09/09/20 1450 09/09/20 2142  BP: 124/74 120/76 123/73 (!) 178/85  Pulse: 69 70 72 69  Resp: 16 16 18 20   Temp: 98.7 F (37.1 C) 98.7 F (37.1 C) 98.1 F (36.7 C) 98.6 F (37 C)  TempSrc: Oral Oral Oral Oral  SpO2: 100% 100% 100% 100%  Weight:      Height:        Intake/Output Summary (Last 24 hours) at 09/10/2020 0956 Last data filed at 09/10/2020 0700 Gross per 24 hour  Intake --  Output 1000 ml  Net -1000 ml   Filed Weights   09/05/20 0139 09/08/20 0623  Weight: 49 kg 51.4 kg    Examination:  General exam: Appears calm and comfortable  Anxious.  Working with physical therapy and walking in the hallway. Chronically sick looking and frail  Respiratory system: Clear to auscultation. Respiratory effort normal. Cardiovascular system: S1 & S2 heard, RRR. No pedal edema. Gastrointestinal system: Soft.  Nontender.  Bowel sounds present. Suprapubic catheter in place, draining clear urine. He has leakage around suprapubic catheter. Central nervous system: Alert and oriented. No focal neurological deficits. Extremities: Generalized weakness. Skin: No rashes, lesions or ulcers Psychiatry: Judgement and insight appear normal. Mood & affect normal.    Data Reviewed: I have personally reviewed  following labs and imaging studies  CBC: Recent Labs  Lab 09/05/20 0204 09/05/20 0812 09/06/20 0429 09/07/20 0435 09/08/20 0431 09/09/20 0512 09/10/20 0635  WBC 18.4*   < > 17.9* 11.2* 7.3 6.4 7.2  NEUTROABS 15.7*  --   --  8.3* 4.4 3.5 4.3  HGB 9.6*   < > 8.5* 7.2* 8.1* 8.2* 8.0*  HCT 30.9*   < > 28.1* 23.8* 26.7* 28.0* 27.4*  MCV 96.6   < > 99.3 98.8 96.7 100.4* 100.0  PLT 296   < > 232 203 223 230 247   < > = values in this interval not displayed.   Basic Metabolic Panel: Recent Labs  Lab 09/05/20 0534 09/05/20 0812 09/06/20 0429 09/07/20 0435 09/08/20 0431 09/09/20 0512  NA 135  --  140 138 137 143  K 3.3*  --  3.4* 3.3* 3.3* 4.2  CL 100  --  105 102 101 108  CO2  24  --  25 26 26 28   GLUCOSE 154*  --  89 97 92 107*  BUN 40*  --  42* 43* 35* 34*  CREATININE 3.54* 3.67* 3.69* 3.70* 3.06* 3.05*  CALCIUM 8.5*  --  8.4* 7.9* 8.1* 8.4*  MG  --  1.6*  --  1.7  --   --   PHOS  --   --   --  4.5  --   --    GFR: Estimated Creatinine Clearance: 14.7 mL/min (A) (by C-G formula based on SCr of 3.05 mg/dL (H)). Liver Function Tests: No results for input(s): AST, ALT, ALKPHOS, BILITOT, PROT, ALBUMIN in the last 168 hours. No results for input(s): LIPASE, AMYLASE in the last 168 hours. No results for input(s): AMMONIA in the last 168 hours. Coagulation Profile: No results for input(s): INR, PROTIME in the last 168 hours. Cardiac Enzymes: No results for input(s): CKTOTAL, CKMB, CKMBINDEX, TROPONINI in the last 168 hours. BNP (last 3 results) No results for input(s): PROBNP in the last 8760 hours. HbA1C: No results for input(s): HGBA1C in the last 72 hours. CBG: No results for input(s): GLUCAP in the last 168 hours. Lipid Profile: No results for input(s): CHOL, HDL, LDLCALC, TRIG, CHOLHDL, LDLDIRECT in the last 72 hours. Thyroid Function Tests: No results for input(s): TSH, T4TOTAL, FREET4, T3FREE, THYROIDAB in the last 72 hours. Anemia Panel: Recent Labs     09/08/20 0023  VITAMINB12 405  FOLATE 16.2  FERRITIN 282  TIBC 185*  IRON 51   Sepsis Labs: Recent Labs  Lab 09/05/20 0204 09/05/20 0534  LATICACIDVEN 1.4 1.7    Recent Results (from the past 240 hour(s))  Resp Panel by RT-PCR (Flu A&B, Covid) Nasopharyngeal Swab     Status: None   Collection Time: 09/05/20  5:16 AM   Specimen: Nasopharyngeal Swab; Nasopharyngeal(NP) swabs in vial transport medium  Result Value Ref Range Status   SARS Coronavirus 2 by RT PCR NEGATIVE NEGATIVE Final    Comment: (NOTE) SARS-CoV-2 target nucleic acids are NOT DETECTED.  The SARS-CoV-2 RNA is generally detectable in upper respiratory specimens during the acute phase of infection. The lowest concentration of SARS-CoV-2 viral copies this assay can detect is 138 copies/mL. A negative result does not preclude SARS-Cov-2 infection and should not be used as the sole basis for treatment or other patient management decisions. A negative result may occur with  improper specimen collection/handling, submission of specimen other than nasopharyngeal swab, presence of viral mutation(s) within the areas targeted by this assay, and inadequate number of viral copies(<138 copies/mL). A negative result must be combined with clinical observations, patient history, and epidemiological information. The expected result is Negative.  Fact Sheet for Patients:  EntrepreneurPulse.com.au  Fact Sheet for Healthcare Providers:  IncredibleEmployment.be  This test is no t yet approved or cleared by the [place] FDA and  has been authorized for detection and/or diagnosis of SARS-CoV-2 by FDA under an Emergency Use Authorization (EUA). This EUA will remain  in effect (meaning this test can be used) for the duration of the COVID-19 declaration under Section 564(b)(1) of the Act, 21 U.S.C.section 360bbb-3(b)(1), unless the authorization is terminated  or revoked sooner.        Influenza A by PCR NEGATIVE NEGATIVE Final   Influenza B by PCR NEGATIVE NEGATIVE Final    Comment: (NOTE) The Xpert Xpress SARS-CoV-2/FLU/RSV plus assay is intended as an aid in the diagnosis of influenza from Nasopharyngeal swab specimens and should not be used  as a sole basis for treatment. Nasal washings and aspirates are unacceptable for Xpert Xpress SARS-CoV-2/FLU/RSV testing.  Fact Sheet for Patients: EntrepreneurPulse.com.au  Fact Sheet for Healthcare Providers: IncredibleEmployment.be  This test is not yet approved or cleared by the [place] FDA and has been authorized for detection and/or diagnosis of SARS-CoV-2 by FDA under an Emergency Use Authorization (EUA). This EUA will remain in effect (meaning this test can be used) for the duration of the COVID-19 declaration under Section 564(b)(1) of the Act, 21 U.S.C. section 360bbb-3(b)(1), unless the authorization is terminated or revoked.  Performed at [facility], [address] [place], [place] [address]   Blood culture (routine x 2)     Status: Abnormal   Collection Time: 09/05/20  6:10 AM   Specimen: BLOOD  Result Value Ref Range Status   Specimen Description   Final    BLOOD LEFT WRIST Performed at [facility], [address], [place], [place] [address]    Special Requests   Final    BOTTLES DRAWN AEROBIC AND ANAEROBIC Blood Culture adequate volume Performed at [facility], [address] [place], [place] [address]    Culture  Setup Time   Final    GRAM POSITIVE COCCI IN CHAINS GRAM NEGATIVE RODS AEROBIC AND ANAEROBIC BOTTLES Gram Stain Report Called to,Read Back By and Verified With: EVANS,H ON 09/05/20 AT 2340 BY LOY,C Performed at [facility] Performed at [facility], [address], [place], [place] [address]    Culture (A)  Final    ENTEROCOCCUS FAECALIS PSEUDOMONAS AERUGINOSA SUSCEPTIBILITIES PERFORMED ON PREVIOUS CULTURE WITHIN THE LAST 5  DAYS. Performed at [facility], [place] [address], [place], [place] [address]    Report Status 09/08/2020 FINAL  Final  Blood culture (routine x 2)     Status: Abnormal (Preliminary result)   Collection Time: 09/05/20  6:12 AM   Specimen: BLOOD  Result Value Ref Range Status   Specimen Description   Final    BLOOD RIGHT ANTECUBITAL Performed at [facility], [address] [place], [place] [address]    Special Requests   Final    BOTTLES DRAWN AEROBIC AND ANAEROBIC Blood Culture adequate volume Performed at [facility], [address], [place], [place] [address]    Culture  Setup Time   Final    GRAM POSITIVE COCCI IN CHAINS GRAM NEGATIVE RODS BOTH ANAEROBIC AND AEROBIC BOTTLES Gram Stain Report Called to,Read Back By and Verified With: EVANS,H ON 09/05/20 AT 2340 BY LOY,C Performed at [facility] Organism ID to follow Performed at [facility], [address], [place],  [address]    Culture ENTEROCOCCUS FAECALIS PSEUDOMONAS AERUGINOSA  (A)  Final   Report Status PENDING  Incomplete   Organism ID, Bacteria ENTEROCOCCUS FAECALIS  Final   Organism ID, Bacteria PSEUDOMONAS AERUGINOSA  Final      Susceptibility   Enterococcus faecalis - MIC*    AMPICILLIN <=2 SENSITIVE Sensitive     VANCOMYCIN 1 SENSITIVE Sensitive     GENTAMICIN SYNERGY SENSITIVE Sensitive     * ENTEROCOCCUS FAECALIS   Pseudomonas aeruginosa - MIC*    CEFTAZIDIME 8 SENSITIVE Sensitive     CIPROFLOXACIN <=0.25 SENSITIVE Sensitive     GENTAMICIN <=1 SENSITIVE Sensitive     IMIPENEM 2 SENSITIVE Sensitive     CEFEPIME <=0.12 SENSITIVE Sensitive     * PSEUDOMONAS AERUGINOSA  Blood Culture ID Panel (Reflexed)     Status: Abnormal   Collection Time: 09/05/20  6:12 AM  Result Value Ref Range Status   Enterococcus faecalis DETECTED (A) NOT DETECTED Final    Comment: CRITICAL RESULT CALLED TO, READ BACK BY AND VERIFIED WITH: H EVANS RN 09/06/20 0432 JDW    Enterococcus Faecium NOT DETECTED NOT  DETECTED Final   Listeria monocytogenes NOT DETECTED NOT DETECTED Final   Staphylococcus species NOT DETECTED NOT DETECTED Final   Staphylococcus aureus (BCID) NOT DETECTED NOT DETECTED Final   Staphylococcus epidermidis NOT DETECTED NOT DETECTED Final   Staphylococcus lugdunensis NOT DETECTED NOT DETECTED Final   Streptococcus species NOT DETECTED NOT DETECTED Final   Streptococcus agalactiae NOT DETECTED NOT DETECTED Final   Streptococcus pneumoniae NOT DETECTED NOT DETECTED Final   Streptococcus pyogenes NOT DETECTED NOT DETECTED Final   A.calcoaceticus-baumannii NOT DETECTED NOT DETECTED Final   Bacteroides fragilis NOT DETECTED NOT DETECTED Final   Enterobacterales DETECTED (A) NOT DETECTED Final    Comment: Enterobacterales represent a large order of gram negative bacteria, not a single organism. Refer to culture for further identification. CRITICAL RESULT CALLED TO, READ BACK BY AND VERIFIED WITH: H EVANS RN 09/06/20 0432 JDW    Enterobacter cloacae complex NOT DETECTED NOT DETECTED Final   Escherichia coli NOT DETECTED NOT DETECTED Final   Klebsiella aerogenes NOT DETECTED NOT DETECTED Final   Klebsiella oxytoca NOT DETECTED NOT DETECTED Final   Klebsiella pneumoniae NOT DETECTED NOT DETECTED Final   Proteus species NOT DETECTED NOT DETECTED Final   Salmonella species NOT DETECTED NOT DETECTED Final   Serratia marcescens NOT DETECTED NOT DETECTED Final   Haemophilus influenzae NOT DETECTED NOT DETECTED Final   Neisseria meningitidis NOT DETECTED NOT DETECTED Final   Pseudomonas aeruginosa DETECTED (A) NOT DETECTED Final    Comment: CRITICAL RESULT CALLED TO, READ BACK BY AND VERIFIED WITH: H EVANS RN 09/06/20 0432 JDW    Stenotrophomonas maltophilia NOT DETECTED NOT DETECTED Final   Candida albicans NOT DETECTED NOT DETECTED Final   Candida auris NOT DETECTED NOT DETECTED Final   Candida glabrata NOT DETECTED NOT DETECTED Final   Candida krusei NOT DETECTED NOT DETECTED Final    Candida parapsilosis NOT DETECTED NOT DETECTED Final   Candida tropicalis NOT DETECTED NOT DETECTED Final   Cryptococcus neoformans/gattii NOT DETECTED NOT DETECTED Final   CTX-M ESBL NOT DETECTED NOT DETECTED Final   Carbapenem resistance IMP NOT DETECTED NOT DETECTED Final   Carbapenem resistance KPC NOT DETECTED NOT DETECTED Final   Carbapenem resistance NDM NOT DETECTED NOT DETECTED Final   Carbapenem resist OXA 48 LIKE NOT DETECTED NOT DETECTED Final   Vancomycin resistance NOT DETECTED NOT DETECTED Final   Carbapenem resistance VIM NOT DETECTED NOT DETECTED Final    Comment: Performed at [facility], [address] [place], [place] [address]  Culture, blood (routine x 2)     Status: None (Preliminary result)   Collection Time: 09/06/20  5:30 AM   Specimen: BLOOD RIGHT HAND  Result Value Ref Range Status   Specimen Description BLOOD RIGHT HAND  Final   Special Requests   Final    BOTTLES DRAWN AEROBIC ONLY Blood Culture results may not be optimal due to an inadequate volume of blood received in culture bottles   Culture   Final    NO GROWTH 4 DAYS Performed at [facility], [address], [place], [place] [address]    Report Status PENDING  Incomplete  Culture, blood (routine x 2)     Status: None (Preliminary result)   Collection  Time: 09/06/20  5:40 AM   Specimen: BLOOD RIGHT HAND  Result Value Ref Range Status   Specimen Description BLOOD RIGHT HAND  Final   Special Requests   Final    BOTTLES DRAWN AEROBIC AND ANAEROBIC Blood Culture results may not be optimal due to an inadequate volume of blood received in culture bottles   Culture   Final    NO GROWTH 4 DAYS Performed at [facility], [address], [place], [place] [address]    Report Status PENDING  Incomplete         Radiology Studies: No results found.      Scheduled Meds: . ascorbic acid  500 mg Oral BID  . darbepoetin (ARANESP) injection - NON-DIALYSIS  40 mcg Subcutaneous Q Mon-1800  .  heparin  5,000 Units Subcutaneous Q8H  . HYDROcodone-acetaminophen  1 tablet Oral BID  . levothyroxine  75 mcg Oral QAC breakfast  . linagliptin  5 mg Oral Daily  .  morphine injection  4 mg Intravenous Once  . multivitamin with minerals  1 tablet Oral Daily   Continuous Infusions: . sodium chloride 1,000 mL (09/07/20 1801)  . piperacillin-tazobactam (ZOSYN)  IV 3.375 g (09/10/20 0530)     LOS: 5 days    Time spent: 30 minutes    Barb Merino, MD [facility] Pager [phone]

## 2020-09-10 NOTE — TOC Progression Note (Signed)
Transition of Care [facility]) - Progression Note   Patient Details  Name: Jordan Ward MRN: [id number] Date of Birth: Dec 04, 1940  Transition of [facility]) CM/SW [place], LCSW Phone Number: 09/10/2020, 11:31 AM  Clinical Narrative: Patient will need to discharge on IV antibiotics. CSW followed up with Vaughan Basta with [facility] as patient is [facility] through [facility]. Per Vaughan Basta, she requested that CSW make IV antibiotic referral to [facility] with [facility]. Referral made to Pam. TOC to follow.  Expected Discharge Plan: [place] Barriers to Discharge: Continued Medical Work up  Expected Discharge Plan and Services Expected Discharge Plan: [place] In-house Referral: Clinical Social Work Discharge Planning Services: NA Post Acute Care Choice: [place] arrangements for the past 2 months: Single Family Home             DME Arranged: N/A DME Agency: NA HH Arranged: PT,RN,OT,Speech Therapy HH Agency: [place] ([place])  Readmission Risk Interventions Readmission Risk Prevention Plan 09/06/2020  Transportation Screening Complete  HRI or Home Care Consult Complete  Social Work Consult for [place] Planning/Counseling Complete  Palliative Care Screening Not Applicable  Medication Review Press photographer) Complete  Some recent data might be hidden

## 2020-09-10 NOTE — Progress Notes (Deleted)
PHARMACY CONSULT NOTE FOR:  OUTPATIENT  PARENTERAL ANTIBIOTIC THERAPY (OPAT)  Indication: E.faecalis and Pseudomonas Aeruginosa Bacteremia Regimen: zosyn 6.75 gm every 24 hours as a continuous infusion End date: 09/20/2020  Changing to continuous infusion administration to ease administration at home   IV antibiotic discharge orders are pended. To discharging provider:  please sign these orders via discharge navigator,  Select New Orders & click on the button choice - Manage This Unsigned Work.     Thank you for allowing pharmacy to be a part of this patient's care.  Jimmy Footman, PharmD, BCPS, [place] Infectious Diseases Clinical Pharmacist Phone: [phone] 09/10/2020, 12:07 PM

## 2020-09-10 NOTE — Progress Notes (Signed)
Patient ID: Jordan Ward, male   DOB: [birth year], 79 y.o.   MRN: [id number] S: Feels "rough" this morning due to diarrhea all last night O:BP (!) 178/85 (BP Location: Right Arm)   Pulse 69   Temp 98.6 F (37 C) (Oral)   Resp 20   Ht 5\' 3"  (1.6 m)   Wt 51.4 kg   SpO2 100%   BMI 20.07 kg/m   Intake/Output Summary (Last 24 hours) at 09/10/2020 4259 Last data filed at 09/10/2020 0700 Gross per 24 hour  Intake --  Output 1000 ml  Net -1000 ml   Intake/Output: I/O last 3 completed shifts: In: 868.7 [P.O.:720; I.V.:98.7; IV Piggyback:50] Out: 3000 [Urine:3000]  Intake/Output this shift:  No intake/output data recorded. Weight change:  Gen: frail, elderly WM in NAD CVS: RRR Resp: cta Abd: benign Ext: no edema  Recent Labs  Lab 09/05/20 0204 09/05/20 0534 09/05/20 0812 09/06/20 0429 09/07/20 0435 09/08/20 0431 09/09/20 0512  NA 134* 135  --  140 138 137 143  K 2.7* 3.3*  --  3.4* 3.3* 3.3* 4.2  CL 98 100  --  105 102 101 108  CO2 25 24  --  25 26 26 28   GLUCOSE 144* 154*  --  89 97 92 107*  BUN 39* 40*  --  42* 43* 35* 34*  CREATININE 3.39* 3.54* 3.67* 3.69* 3.70* 3.06* 3.05*  CALCIUM 9.2 8.5*  --  8.4* 7.9* 8.1* 8.4*  PHOS  --   --   --   --  4.5  --   --    Liver Function Tests: No results for input(s): AST, ALT, ALKPHOS, BILITOT, PROT, ALBUMIN in the last 168 hours. No results for input(s): LIPASE, AMYLASE in the last 168 hours. No results for input(s): AMMONIA in the last 168 hours. CBC: Recent Labs  Lab 09/06/20 0429 09/07/20 0435 09/08/20 0431 09/09/20 0512 09/10/20 0635  WBC 17.9* 11.2* 7.3 6.4 7.2  NEUTROABS  --  8.3* 4.4 3.5 4.3  HGB 8.5* 7.2* 8.1* 8.2* 8.0*  HCT 28.1* 23.8* 26.7* 28.0* 27.4*  MCV 99.3 98.8 96.7 100.4* 100.0  PLT 232 203 223 230 247   Cardiac Enzymes: No results for input(s): CKTOTAL, CKMB, CKMBINDEX, TROPONINI in the last 168 hours. CBG: No results for input(s): GLUCAP in the last 168 hours.  Iron Studies:  Recent Labs     09/08/20 0023  IRON 51  TIBC 185*  FERRITIN 282   Studies/Results: No results found. Marland Kitchen ascorbic acid  500 mg Oral BID  . heparin  5,000 Units Subcutaneous Q8H  . HYDROcodone-acetaminophen  1 tablet Oral BID  . levothyroxine  75 mcg Oral QAC breakfast  . linagliptin  5 mg Oral Daily  .  morphine injection  4 mg Intravenous Once  . multivitamin with minerals  1 tablet Oral Daily    BMET    Component Value Date/Time   NA 143 09/09/2020 0512   NA 136 (A) 09/27/2018 0000   K 4.2 09/09/2020 0512   CL 108 09/09/2020 0512   CO2 28 09/09/2020 0512   GLUCOSE 107 (H) 09/09/2020 0512   BUN 34 (H) 09/09/2020 0512   BUN 28 (A) 09/27/2018 0000   CREATININE 3.05 (H) 09/09/2020 0512   CALCIUM 8.4 (L) 09/09/2020 0512   GFRNONAA 20 (L) 09/09/2020 0512   GFRAA 43 (L) 05/07/2020 0521   CBC    Component Value Date/Time   WBC 7.2 09/10/2020 0635   RBC 2.74 (L) 09/10/2020 5638  HGB 8.0 (L) 09/10/2020 0635   HCT 27.4 (L) 09/10/2020 0635   PLT 247 09/10/2020 0635   MCV 100.0 09/10/2020 0635   MCH 29.2 09/10/2020 0635   MCHC 29.2 (L) 09/10/2020 0635   RDW 15.2 09/10/2020 0635   LYMPHSABS 2.2 09/10/2020 0635   MONOABS 0.5 09/10/2020 0635   EOSABS 0.3 09/10/2020 0635   BASOSABS 0.0 09/10/2020 0635     Assessment/Plan:  1. AKI/CKD stage 3- due to solitary functioning left kidney complicated by obstructing left-sided ureteral stone s/p cystoscopy with stent placement.  Scr peaked at 3.7 and now stabilized around 3.05.  Unclear if this is new baseline following recurrent obstructive uropathy.  No indication for dialysis at this time.  Will continue to follow.  Excellent UOP 2. Obstructing left ureteral stone in solitary functioning kidney.  S/p cystoscopy and ureteral stent placement by Urology.  Follow up with urology.  3. Polymicrobial urosepsis- Enterococcal faecalis and pseudomonas.  Currently on zosyn per primary svc. 4. Anemia- related to acute illness +/- CKD.  Check iron stores and  will start ESA. 5. Neurogenic bladder- s/p foley catheter 6. Multiple sclerosis 7. Recent Covid-19 infection 8. Chronic diastolic CHF- stable 9. CAD 10. P. Afib  Donetta Potts, MD [employer] [phone]

## 2020-09-10 NOTE — Progress Notes (Signed)
      INFECTIOUS DISEASE ATTENDING ADDENDUM:   Date: 09/10/2020  Patient name: Jordan Ward  Medical record number: [id number]  Date of birth: 27-Apr-1941    Patient with multiple medical problems chronic kidney disease with a single functioning left kidney which been complicated by obstructive left-sided stones requiring cystoscopy now with polymicrobial bacteremia thought to be due to urinary source with Enterococcus faecalis, Pseudomonas and Morganella on culture.  Repeat blood cultures have cleared on Zosyn.  The Morganella species is fairly resistant including some intermediate susceptibility to Carbapenem therapy.  2D echocardiogram is without evidence of endocarditis.  I would have him complete a 2-week course of Gregg 09/10/2020, 3:47 PM

## 2020-09-10 NOTE — Progress Notes (Addendum)
PHARMACY CONSULT NOTE FOR:  OUTPATIENT  PARENTERAL ANTIBIOTIC THERAPY (OPAT)  Indication: Pseudomonas Aeruginosa and Morganella Bacteremia Regimen: Merrem 500mg  IV q24h End date: 09/20/2020  IV antibiotic discharge orders are pended. To discharging provider:  please sign these orders via discharge navigator,  Select New Orders & click on the button choice - Manage This Unsigned Work.     Thank you for allowing pharmacy to be a part of this patient's care.  Trenton Gammon, [place] 09/10/2020, 11:02 AM

## 2020-09-11 ENCOUNTER — Encounter (HOSPITAL_COMMUNITY): Payer: Self-pay | Admitting: Internal Medicine

## 2020-09-11 ENCOUNTER — Inpatient Hospital Stay (HOSPITAL_COMMUNITY): Payer: Medicare Other | Admitting: Certified Registered"

## 2020-09-11 ENCOUNTER — Inpatient Hospital Stay (HOSPITAL_COMMUNITY): Payer: Medicare Other

## 2020-09-11 ENCOUNTER — Encounter (HOSPITAL_COMMUNITY)
Admission: EM | Disposition: A | Discharge status: Home Health | Payer: Self-pay | Source: Home / Self Care | Attending: Internal Medicine

## 2020-09-11 HISTORY — PX: CENTRAL LINE INSERTION-TUNNELED: CATH118291

## 2020-09-11 LAB — CBC WITH DIFFERENTIAL/PLATELET
Abs Immature Granulocytes: 0.04 10*3/uL (ref 0.00–0.07)
Basophils Absolute: 0.1 10*3/uL (ref 0.0–0.1)
Basophils Relative: 1 %
Eosinophils Absolute: 0.3 10*3/uL (ref 0.0–0.5)
Eosinophils Relative: 4 %
HCT: 27.3 % — ABNORMAL LOW (ref 39.0–52.0)
Hemoglobin: 8.3 g/dL — ABNORMAL LOW (ref 13.0–17.0)
Immature Granulocytes: 1 %
Lymphocytes Relative: 36 %
Lymphs Abs: 2.6 10*3/uL (ref 0.7–4.0)
MCH: 29.6 pg (ref 26.0–34.0)
MCHC: 30.4 g/dL (ref 30.0–36.0)
MCV: 97.5 fL (ref 80.0–100.0)
Monocytes Absolute: 0.6 10*3/uL (ref 0.1–1.0)
Monocytes Relative: 9 %
Neutro Abs: 3.7 10*3/uL (ref 1.7–7.7)
Neutrophils Relative %: 49 %
Platelets: 256 10*3/uL (ref 150–400)
RBC: 2.8 MIL/uL — ABNORMAL LOW (ref 4.22–5.81)
RDW: 15.2 % (ref 11.5–15.5)
WBC: 7.3 10*3/uL (ref 4.0–10.5)
nRBC: 0 % (ref 0.0–0.2)

## 2020-09-11 LAB — RENAL FUNCTION PANEL
Albumin: 2.7 g/dL — ABNORMAL LOW (ref 3.5–5.0)
Anion gap: 7 (ref 5–15)
BUN: 26 mg/dL — ABNORMAL HIGH (ref 8–23)
CO2: 27 mmol/L (ref 22–32)
Calcium: 8.9 mg/dL (ref 8.9–10.3)
Chloride: 103 mmol/L (ref 98–111)
Creatinine, Ser: 2.35 mg/dL — ABNORMAL HIGH (ref 0.61–1.24)
GFR, Estimated: 28 mL/min — ABNORMAL LOW (ref >60)
Glucose, Bld: 94 mg/dL (ref 70–99)
Phosphorus: 3.7 mg/dL (ref 2.5–4.6)
Potassium: 3.5 mmol/L (ref 3.5–5.1)
Sodium: 137 mmol/L (ref 135–145)

## 2020-09-11 SURGERY — CENTRAL LINE INSERTION-TUNNELED
Anesthesia: Monitor Anesthesia Care | Site: Chest | Laterality: Right

## 2020-09-11 MED ORDER — HEPARIN SOD (PORK) LOCK FLUSH 100 UNIT/ML IV SOLN
INTRAVENOUS | Status: AC
  Filled 2020-09-11: qty 5

## 2020-09-11 MED ORDER — ONDANSETRON HCL 4 MG/2ML IJ SOLN
INTRAMUSCULAR | Status: AC
  Filled 2020-09-11: qty 2

## 2020-09-11 MED ORDER — HEPARIN SOD (PORK) LOCK FLUSH 100 UNIT/ML IV SOLN
INTRAVENOUS | Status: DC | PRN
  Administered 2020-09-11: 500 [IU] via INTRAVENOUS

## 2020-09-11 MED ORDER — LACTATED RINGERS IV SOLN: INTRAVENOUS | Status: DC | PRN

## 2020-09-11 MED ORDER — CHLORHEXIDINE GLUCONATE 0.12 % MT SOLN: 15.0000 mL | Freq: Once | OROMUCOSAL | Status: DC

## 2020-09-11 MED ORDER — CHLORHEXIDINE GLUCONATE CLOTH 2 % EX PADS
6.0000 | MEDICATED_PAD | Freq: Once | CUTANEOUS | Status: AC
  Administered 2020-09-11: 12:00:00 6 via TOPICAL

## 2020-09-11 MED ORDER — LIDOCAINE HCL (PF) 2 % IJ SOLN
INTRAMUSCULAR | Status: AC
  Filled 2020-09-11: qty 5

## 2020-09-11 MED ORDER — FENTANYL CITRATE (PF) 100 MCG/2ML IJ SOLN
INTRAMUSCULAR | Status: AC
  Filled 2020-09-11: qty 2

## 2020-09-11 MED ORDER — LIDOCAINE HCL (PF) 1 % IJ SOLN
INTRAMUSCULAR | Status: AC
  Filled 2020-09-11: qty 30

## 2020-09-11 MED ORDER — ORAL CARE MOUTH RINSE: 15.0000 mL | Freq: Once | OROMUCOSAL | Status: DC

## 2020-09-11 MED ORDER — AMOXICILLIN 500 MG PO CAPS: 500.0000 mg | ORAL_CAPSULE | Freq: Two times a day (BID) | ORAL | 0 refills | Status: AC

## 2020-09-11 MED ORDER — LIDOCAINE HCL (PF) 1 % IJ SOLN
INTRAMUSCULAR | Status: DC | PRN
  Administered 2020-09-11: 14 mL

## 2020-09-11 MED ORDER — LACTATED RINGERS IV SOLN: INTRAVENOUS | Status: DC

## 2020-09-11 MED ORDER — MEROPENEM IV (FOR PTA / DISCHARGE USE ONLY): 500.0000 mg | INTRAVENOUS | 0 refills | Status: AC

## 2020-09-11 MED ORDER — SODIUM CHLORIDE (PF) 0.9 % IJ SOLN
INTRAMUSCULAR | Status: DC | PRN
  Administered 2020-09-11: 10 mL via INTRAVENOUS

## 2020-09-11 SURGICAL SUPPLY — 56 items
ADH SKN CLS APL DERMABOND .7 (GAUZE/BANDAGES/DRESSINGS) ×2
APL PRP STRL LF ISPRP CHG 10.5 (MISCELLANEOUS) ×2
APPLICATOR CHLORAPREP 10.5 ORG (MISCELLANEOUS) ×4 IMPLANT
BAG DECANTER FOR FLEXI CONT (MISCELLANEOUS) ×4 IMPLANT
BIOPATCH BLUE 3/4IN DISK W/1.5 (GAUZE/BANDAGES/DRESSINGS) ×3 IMPLANT
BIOPATCH RED 1 DISK 7.0 (GAUZE/BANDAGES/DRESSINGS) ×3 IMPLANT
BIOPATCH RED 1IN DISK 7.0MM (GAUZE/BANDAGES/DRESSINGS) ×1
CATH PALINDROME-P 19CM W/VT (CATHETERS) IMPLANT
CATH PALINDROME-P 23CM W/VT (CATHETERS) IMPLANT
COVER LIGHT HANDLE STERIS (MISCELLANEOUS) ×8 IMPLANT
COVER PROBE U/S 5X48 (MISCELLANEOUS) ×4 IMPLANT
COVER WAND RF STERILE (DRAPES) ×4 IMPLANT
DECANTER SPIKE VIAL GLASS SM (MISCELLANEOUS) ×8 IMPLANT
DERMABOND ADVANCED (GAUZE/BANDAGES/DRESSINGS) ×2
DERMABOND ADVANCED .7 DNX12 (GAUZE/BANDAGES/DRESSINGS) ×2 IMPLANT
DRAPE C-ARM FOLDED MOBILE STRL (DRAPES) ×4 IMPLANT
DRAPE CHEST BREAST 15X10 FENES (DRAPES) ×4 IMPLANT
DRSG MEPILEX SACRM 8.7X9.8 (GAUZE/BANDAGES/DRESSINGS) ×3 IMPLANT
DRSG SORBAVIEW 3.5X5-5/16 MED (GAUZE/BANDAGES/DRESSINGS) ×4 IMPLANT
DRSG TEGADERM 4X4.75 (GAUZE/BANDAGES/DRESSINGS) ×3 IMPLANT
ELECT REM PT RETURN 9FT ADLT (ELECTROSURGICAL) ×4
ELECTRODE REM PT RTRN 9FT ADLT (ELECTROSURGICAL) ×2 IMPLANT
GAUZE 4X4 16PLY RFD (DISPOSABLE) ×4 IMPLANT
GEL ULTRASOUND 20GR AQUASONIC (MISCELLANEOUS) ×4 IMPLANT
GLOVE BIO SURGEON STRL SZ 6.5 (GLOVE) ×3 IMPLANT
GLOVE BIO SURGEON STRL SZ7 (GLOVE) ×3 IMPLANT
GLOVE BIO SURGEONS STRL SZ 6.5 (GLOVE) ×1
GLOVE BIOGEL PI IND STRL 6.5 (GLOVE) ×2 IMPLANT
GLOVE BIOGEL PI IND STRL 7.0 (GLOVE) ×4 IMPLANT
GLOVE BIOGEL PI INDICATOR 6.5 (GLOVE) ×2
GLOVE BIOGEL PI INDICATOR 7.0 (GLOVE) ×4
GOWN STRL REUS W/TWL LRG LVL3 (GOWN DISPOSABLE) ×8 IMPLANT
IV CONNECTOR ONE LINK NDLESS (IV SETS) IMPLANT
IV NS 500ML (IV SOLUTION) ×4
IV NS 500ML BAXH (IV SOLUTION) ×2 IMPLANT
KIT BLADEGUARD II DBL (SET/KITS/TRAYS/PACK) ×4 IMPLANT
KIT CVC 2 LUMEN POWERLINE 6FR (CATHETERS) ×1 IMPLANT
KIT DUAL LUMEN POWERLINE 6FR (CATHETERS) ×2
KIT PALINDROME-P 55CM (CATHETERS) IMPLANT
KIT TURNOVER KIT A (KITS) ×4 IMPLANT
MARKER SKIN DUAL TIP RULER LAB (MISCELLANEOUS) ×4 IMPLANT
NDL HYPO 18GX1.5 BLUNT FILL (NEEDLE) ×1 IMPLANT
NDL HYPO 25X1 1.5 SAFETY (NEEDLE) ×1 IMPLANT
NEEDLE HYPO 18GX1.5 BLUNT FILL (NEEDLE) ×4 IMPLANT
NEEDLE HYPO 25X1 1.5 SAFETY (NEEDLE) ×4 IMPLANT
PACK BASIC III (CUSTOM PROCEDURE TRAY) ×4
PACK SRG BSC III STRL LF ECLPS (CUSTOM PROCEDURE TRAY) ×2 IMPLANT
PAD ARMBOARD 7.5X6 YLW CONV (MISCELLANEOUS) ×4 IMPLANT
PENCIL SMOKE EVACUATOR COATED (MISCELLANEOUS) ×4 IMPLANT
SET BASIN LINEN APH (SET/KITS/TRAYS/PACK) ×4 IMPLANT
SUT MNCRL AB 4-0 PS2 18 (SUTURE) ×4 IMPLANT
SUT SILK 2 0 FSL 18 (SUTURE) ×4 IMPLANT
SUT VIC AB 3-0 SH 27 (SUTURE) ×4
SUT VIC AB 3-0 SH 27X BRD (SUTURE) ×2 IMPLANT
SYR 10ML LL (SYRINGE) ×8 IMPLANT
SYR CONTROL 10ML LL (SYRINGE) ×4 IMPLANT

## 2020-09-11 NOTE — H&P (View-Only) (Signed)
[facility] Consult  Reason for Consult: Tunneled central line for bacteremia/ chronic kidney disease  Referring Physician:  Dr. Laurena Bering, MD   Chief Complaint    suprapubic cath problem      HPI: Jordan Ward is a 79 y.o. male with urosepsis and bacteremia who needs antibiotics for 2 weeks at home. He is unable to get a PICC due to chronic kidney disease and needs a tunneled internal jugular central line. I have been requested to place this and have been able to the catheter from [place].   The patient reports he is fair but has been in and out of the hospital several times in the last few months. I saw him in the past for a hernia that we opted to not repair due to his comorbidities and medical issues.   Past Medical History:  Diagnosis Date  . Anemia   . Arthritis   . Back pain, chronic   . Bilateral carotid bruits   . C. difficile colitis 09/2011  . CAD (coronary artery disease)   . Carotid artery stenosis   . Cerebrovascular disease   . Cerebrovascular disease 08/17/2018  . Colon polyps   . Complication of cystostomy catheter, initial encounter ([place]) 04/20/2020  . Dyslipidemia   . Dysphagia 10/07/2011  . Encephalopathy   . Gait disorder   . HA (headache)   . High grade dysplasia in colonic adenoma 09/2005  . History of kidney stones   . HTN (hypertension), malignant 10/06/2011  . Hypernatremia   . Hypokalemia   . Hypothyroidism 10/08/2011  . Insomnia   . Junctional rhythm   . Kidney stones   . MS (multiple sclerosis) ([place])   . Neuromuscular disorder (HCC)    MS  . OSA (obstructive sleep apnea)   . Paroxysmal atrial tachycardia ([place])   . Peripheral vascular disease ([place])   . Pneumonia 4 yrs ago  . Pulmonary fibrosis ([place]) 10/06/2011  . Pulmonary nodule 10/08/2011  . PVD (peripheral vascular disease) ([place])   . Sacral ulcer ([place])   . Sleep apnea    cannot tolerate  . Stroke [facility])    left sided weakness  . Suprapubic catheter ([place])   . TIA (transient  ischemic attack)   . Tremors of nervous system 10/08/2011  . Urinary tract infection   . Ventral hernia without obstruction or gangrene     Large 8X9cm ventral hernia with loss of domain. CT reads report as diastasis recti with herniation or diastasis recti.  Dr. Constance Haw, Surgery, reviewed CT with radiology and there is herniation with only hernia sac or peritoneum over the bowel and large separation of the rectus muslce (i.e. diastasis recti aka loss of domain).  No surgical intervention recommended given size, age, and health.     Past Surgical History:  Procedure Laterality Date  . APPENDECTOMY  09/2005   at time of left hemicolectomy  . BACK SURGERY  1976/1979   lower  . BILIARY DILATION N/A 03/03/2020   Procedure: BILIARY DILATION;  Surgeon: Rogene Houston, MD;  [facility];  Service: Endoscopy;  Laterality: N/A;  . CATARACT EXTRACTION W/PHACO Right 03/08/2018   Procedure: CATARACT EXTRACTION PHACO AND INTRAOCULAR LENS PLACEMENT RIGHT EYE;  Surgeon: Tonny Branch, MD;  Location: [facility] ORS;  Service: Ophthalmology;  Laterality: Right;  CDE: 8.86  . CATARACT EXTRACTION W/PHACO Left 04/05/2018   Procedure: CATARACT EXTRACTION PHACO AND INTRAOCULAR LENS PLACEMENT (IOC);  Surgeon: Tonny Branch, MD;  Location: [facility] ORS;  Service: Ophthalmology;  Laterality: Left;  CDE: 7.36  . CHOLECYSTECTOMY     Dr. Tamala Julian  . COLON SURGERY  09/2005   Fleishman: four tubular adenomas, large adenomatous polyp with HIGH GRADE dysplasia  . COLONOSCOPY  11/2004   Dr. Sharol Roussel sessile polyp splenic flexure, 76mm sessile polyp desc colon, tubulovillous adenoma (bx not removed)  . COLONOSCOPY  01/2005   poor prep, polyp could not be found  . COLONOSCOPY  05/2005   with EMR, polypectomy Dr. Olegario Messier, bx showed high grade dysplasia, partially resected  . COLONOSCOPY  09/2005   Dr. Arsenio Loader, [place] ink tattooing, four villous colon polyp (3 had been missed on previous colonoscopies due to limitations of procedures   . COLONOSCOPY  09/2006   normal TI, no polyps  . COLONOSCOPY  10/2007   Dr. Imogene Burn distal mammillations, benign bx, normal TI, random bx neg for microscopic colitis  . CYSTOSCOPY W/ URETERAL STENT PLACEMENT Left 09/05/2020   Procedure: CYSTOSCOPY WITH RETROGRADE PYELOGRAM/URETERAL STENT PLACEMENT;  Surgeon: Ardis Hughs, MD;  Location: [facility];  Service: Urology;  Laterality: Left;  . CYSTOSCOPY WITH LITHOLAPAXY N/A 07/27/2018   Procedure: CYSTOSCOPY WITH LITHOLAPAXY VIA  SUPRAPUBIC TUBE;  Surgeon: Franchot Gallo, MD;  Location: [facility];  Service: Urology;  Laterality: N/A;  . CYSTOSCOPY WITH RETROGRADE PYELOGRAM, URETEROSCOPY AND STENT PLACEMENT Left 06/09/2017   Procedure: CYSTOSCOPY WITH LEFT RETROGRADE PYELOGRAM, LEFT URETEROSCOPY, LEFT URETEROSCOPIC STONE EXTRACTION, LEFT URETERAL STENT PLACEMENT;  Surgeon: Franchot Gallo, MD;  Location: [facility];  Service: Urology;  Laterality: Left;  . CYSTOSCOPY/URETEROSCOPY/HOLMIUM LASER/STENT PLACEMENT Left 05/22/2020   Procedure: CYSTOSCOPY/URETEROSCOPY/STENT PLACEMENT;  Surgeon: Franchot Gallo, MD;  Location: [facility];  Service: Urology;  Laterality: Left;  . ERCP N/A 03/03/2020   Procedure: ENDOSCOPIC RETROGRADE CHOLANGIOPANCREATOGRAPHY (ERCP);  Surgeon: Rogene Houston, MD;  [facility];  Service: Endoscopy;  Laterality: N/A;  . ERCP N/A 04/20/2020   Procedure: ENDOSCOPIC RETROGRADE CHOLANGIOPANCREATOGRAPHY (ERCP);  Surgeon: Rogene Houston, MD;  [facility];  Service: Endoscopy;  Laterality: N/A;  to be done at 7:30am in OR  . GASTROINTESTINAL STENT REMOVAL N/A 04/20/2020   Procedure: STENT REMOVAL;  Surgeon: Rogene Houston, MD;  [facility];  Service: Endoscopy;  Laterality: N/A;  . INGUINAL HERNIA REPAIR  1971   bilateral  . INSERTION OF SUPRAPUBIC CATHETER  06/09/2017   Procedure: EXCHANGE OF SUPRAPUBIC CATHETER;  Surgeon: Franchot Gallo, MD;  Location: [facility];  Service: Urology;;  .  INSERTION OF SUPRAPUBIC CATHETER  05/22/2020   Procedure: SUPRAPUBIC CATHETER EXCHANGE;  Surgeon: Franchot Gallo, MD;  Location: [facility];  Service: Urology;;  . IR NEPHROSTOMY PLACEMENT LEFT  05/26/2017  . IR NEPHROSTOMY PLACEMENT LEFT  05/02/2020  . KIDNEY STONE SURGERY  09/13/2015  . LITHOTRIPSY N/A 03/03/2020   Procedure: MECHANICAL LITHOTRIPSY WITH REMOVAL OF MULTIPLE STONE FRAGMENTS;  Surgeon: Rogene Houston, MD;  [facility];  Service: Endoscopy;  Laterality: N/A;  . NEPHROLITHOTOMY Left 09/13/2015   Procedure: LEFT PERCUTANEOUS NEPHROLITHOTOMY ;  Surgeon: Franchot Gallo, MD;  Location: [facility];  Service: Urology;  Laterality: Left;  . NEPHROSTOMY TUBE REMOVAL  05/22/2020   Procedure: NEPHROSTOMY TUBE REMOVAL;  Surgeon: Franchot Gallo, MD;  Location: [facility];  Service: Urology;;  . REMOVAL OF STONES N/A 04/20/2020   Procedure: REMOVAL OF STONES;  Surgeon: Rogene Houston, MD;  [facility];  Service: Endoscopy;  Laterality: N/A;  . SPHINCTEROTOMY  03/03/2020   Procedure: BILLARY SPHINCTEROTOMY;  Surgeon: Hildred Laser  U, MD;  Location[facility];  Service: Endoscopy;;  . SUPRAPUBIC CATHETER INSERTION      Family History  Problem Relation Age of Onset  . Cirrhosis Brother        etoh  . Stroke Mother 50  . Coronary artery disease Father 34  . Heart attack Brother   . Cancer Sister   . Multiple sclerosis Other   . Colon cancer Neg Hx     Social History   Tobacco Use  . Smoking status: Former Smoker    Packs/day: 1.00    Years: 25.00    Pack years: 25.00    Types: Cigarettes    Quit date: 03/03/1988    Years since quitting: 32.5  . Smokeless tobacco: Never Used  Vaping Use  . Vaping Use: Never used  Substance Use Topics  . Alcohol use: No  . Drug use: No    Medications:  I have reviewed the patient's current medications. Prior to Admission:  Medications Prior to Admission  Medication Sig Dispense Refill Last Dose  . acetaminophen  (TYLENOL) 325 MG tablet Take 2 tablets (650 mg total) by mouth every 6 (six) hours as needed for mild pain or headache (fever >/= 101). 30 tablet 0   . ascorbic acid (VITAMIN C) 500 MG tablet Take 1 tablet (500 mg total) by mouth 2 (two) times daily. 30 tablet 0 Past Week at Unknown time  . cephALEXin (KEFLEX) 250 MG capsule Take 1 capsule (250 mg total) by mouth at bedtime. 30 capsule 0 Past Week at Unknown time  . diazepam (VALIUM) 5 MG tablet Take 1 tablet (5 mg total) by mouth every 12 (twelve) hours as needed for muscle spasms. 60 tablet 0 Past Week at Unknown time  . guaiFENesin-dextromethorphan (ROBITUSSIN DM) 100-10 MG/5ML syrup Take 10 mLs by mouth every 4 (four) hours as needed for cough. 118 mL 0 Past Week at Unknown time  . HYDROcodone-acetaminophen (NORCO) 7.5-325 MG per tablet Take 1 tablet by mouth 2 (two) times daily. Max APAP 3 GM IN 24 HOURS FROM ALL SOURCES (Patient taking differently: Take 1 tablet by mouth in the morning and at bedtime. ALL MEDICATIONS TO BE CRUSHED AND PLACED IN APPLESAUCE) 60 tablet 0 Past Week at Unknown time  . Ipratropium-Albuterol (COMBIVENT) 20-100 MCG/ACT AERS respimat Inhale 1 puff into the lungs every 6 (six) hours as needed for wheezing. 4 g 0 Past Week at Unknown time  . levothyroxine (SYNTHROID) 75 MCG tablet Take 75 mcg by mouth daily before breakfast. ALL MEDICATIONS TO BE CRUSHED AND PLACED IN APPLESAUCE   Past Week at Unknown time  . linagliptin (TRADJENTA) 5 MG TABS tablet Take 1 tablet (5 mg total) by mouth daily. 30 tablet 0 Past Week at Unknown time  . Maltodextrin-Xanthan Gum (RESOURCE THICKENUP CLEAR) POWD Use as directed 125 g g Past Week at Unknown time  . Multiple Vitamin (MULTIVITAMIN WITH MINERALS) TABS tablet Take 1 tablet by mouth daily. 30 tablet 0 Past Week at Unknown time   Scheduled: . amoxicillin  500 mg Oral Q12H  . ascorbic acid  500 mg Oral BID  . Chlorhexidine Gluconate Cloth  6 each Topical Once  . darbepoetin (ARANESP)  injection - NON-DIALYSIS  40 mcg Subcutaneous Q Mon-1800  . heparin  5,000 Units Subcutaneous Q8H  . HYDROcodone-acetaminophen  1 tablet Oral BID  . levothyroxine  75 mcg Oral QAC breakfast  .  morphine injection  4 mg Intravenous Once  . multivitamin with minerals  1 tablet Oral Daily   Continuous: . sodium chloride 1,000 mL (09/07/20 1801)  . meropenem (MERREM) IV 500 mg (09/10/20 1455)   HEN:IDPOEU chloride, acetaminophen **OR** acetaminophen, diazepam, guaiFENesin-dextromethorphan, ipratropium-albuterol, Resource ThickenUp Clear  Allergies  Allergen Reactions  . Tetracyclines & Related Anaphylaxis and Rash  . Ciprofloxacin     Trouble swallowing unknown reaction according to wife      ROS:  A comprehensive review of systems was negative except for: Genitourinary: positive for urosepsis, bacteremia, suprapubic in place  Blood pressure (!) 162/84, pulse 70, temperature 98.4 F (36.9 C), resp. rate 16, height 5\' 3"  (1.6 m), weight 51.4 kg, SpO2 100 %. Physical Exam Vitals reviewed.  Constitutional:      Appearance: He is cachectic. He is not ill-appearing.  HENT:     Head: Normocephalic.     Nose: Nose normal.     Mouth/Throat:     Mouth: Mucous membranes are moist.  Neck:     Comments: No prior catheters in neck  Cardiovascular:     Rate and Rhythm: Normal rate.  Pulmonary:     Effort: Pulmonary effort is normal.  Abdominal:     General: There is no distension.     Palpations: Abdomen is soft.     Tenderness: There is no abdominal tenderness.  Skin:    General: Skin is warm.  Neurological:     General: No focal deficit present.     Mental Status: He is oriented to person, place, and time.  Psychiatric:        Mood and Affect: Mood normal.        Behavior: Behavior normal.        Thought Content: Thought content normal.        Judgment: Judgment normal.     Results: Results for orders placed or performed during the hospital encounter of 09/05/20 (from the  past 48 hour(s))  CBC with Differential/Platelet     Status: Abnormal   Collection Time: 09/10/20  6:35 AM  Result Value Ref Range   WBC 7.2 4.0 - 10.5 K/uL   RBC 2.74 (L) 4.22 - 5.81 MIL/uL   Hemoglobin 8.0 (L) 13.0 - 17.0 g/dL   HCT 27.4 (L) 39.0 - 52.0 %   MCV 100.0 80.0 - 100.0 fL   MCH 29.2 26.0 - 34.0 pg   MCHC 29.2 (L) 30.0 - 36.0 g/dL   RDW 15.2 11.5 - 15.5 %   Platelets 247 150 - 400 K/uL   nRBC 0.0 0.0 - 0.2 %   Neutrophils Relative % 59 %   Neutro Abs 4.3 1.7 - 7.7 K/uL   Lymphocytes Relative 30 %   Lymphs Abs 2.2 0.7 - 4.0 K/uL   Monocytes Relative 6 %   Monocytes Absolute 0.5 0.1 - 1.0 K/uL   Eosinophils Relative 4 %   Eosinophils Absolute 0.3 0.0 - 0.5 K/uL   Basophils Relative 1 %   Basophils Absolute 0.0 0.0 - 0.1 K/uL   Immature Granulocytes 0 %   Abs Immature Granulocytes 0.03 0.00 - 0.07 K/uL    Comment: Performed at [facility], [address], [place], [place] [address]  CBC with Differential/Platelet     Status: Abnormal   Collection Time: 09/11/20  5:13 AM  Result Value Ref Range   WBC 7.3 4.0 - 10.5 K/uL   RBC 2.80 (L) 4.22 - 5.81 MIL/uL   Hemoglobin 8.3 (L) 13.0 - 17.0 g/dL   HCT 27.3 (L) 39.0 - 52.0 %  MCV 97.5 80.0 - 100.0 fL   MCH 29.6 26.0 - 34.0 pg   MCHC 30.4 30.0 - 36.0 g/dL   RDW 15.2 11.5 - 15.5 %   Platelets 256 150 - 400 K/uL   nRBC 0.0 0.0 - 0.2 %   Neutrophils Relative % 49 %   Neutro Abs 3.7 1.7 - 7.7 K/uL   Lymphocytes Relative 36 %   Lymphs Abs 2.6 0.7 - 4.0 K/uL   Monocytes Relative 9 %   Monocytes Absolute 0.6 0.1 - 1.0 K/uL   Eosinophils Relative 4 %   Eosinophils Absolute 0.3 0.0 - 0.5 K/uL   Basophils Relative 1 %   Basophils Absolute 0.1 0.0 - 0.1 K/uL   Immature Granulocytes 1 %   Abs Immature Granulocytes 0.04 0.00 - 0.07 K/uL    Comment: Performed at [facility], [address] [place], [place] [address]  Renal function panel     Status: Abnormal   Collection Time: 09/11/20  5:13 AM  Result Value Ref Range    Sodium 137 135 - 145 mmol/L   Potassium 3.5 3.5 - 5.1 mmol/L   Chloride 103 98 - 111 mmol/L   CO2 27 22 - 32 mmol/L   Glucose, Bld 94 70 - 99 mg/dL    Comment: Glucose reference range applies only to samples taken after fasting for at least 8 hours.   BUN 26 (H) 8 - 23 mg/dL   Creatinine, Ser 2.35 (H) 0.61 - 1.24 mg/dL   Calcium 8.9 8.9 - 10.3 mg/dL   Phosphorus 3.7 2.5 - 4.6 mg/dL   Albumin 2.7 (L) 3.5 - 5.0 g/dL   GFR, Estimated 28 (L) >60 mL/min    Comment: (NOTE) Calculated using the CKD-EPI Creatinine Equation (2021)    Anion gap 7 5 - 15    Comment: Performed at [facility], [address] [place], [place] [address]      Assessment & Plan:  Jordan Ward is a 79 y.o. male with urosepsis, bacteremia who needs tunneled line. We discussed risk of bleeding, infection, pneumothorax, and injury to vessels.   -Will plan for OR today    All questions were answered to the satisfaction of the patient and family.     Virl Cagey 09/11/2020, 8:52 AM

## 2020-09-11 NOTE — Progress Notes (Signed)
[facility]  CXR done. Slight curve of the line in the neck. The catheter draws back and flushes well. Additional oblique images done and it looks like the catheter just curls into the vein without kinking.   Updated wife about catheter placement. Plan for him to go home per Dr. Laurena Bering.  Once he needs line out, they can call the office.  Curlene Labrum, MD [facility] [address] [place], [place] [address] [phone] (office)

## 2020-09-11 NOTE — Op Note (Signed)
Operative Note 09/11/20   Preoperative Diagnosis: Bacteremia, chronic kidney disease    Postoperative Diagnosis: Same   Procedure(s) Performed: Tunneled Central Line Catheter Placement, Right Internal Jugular (Dual Lumen Powerline, 6 [place])    Surgeon: Lanell Matar. Constance Haw, MD   Assistants: No qualified resident was available   Anesthesia: Local anesthesia    Anesthesiologist: Louann Sjogren, MD    Specimens: None   Estimated Blood Loss: Minimal   Fluoroscopy time: See fluro documentation   Blood Replacement: None    Complications: None    Operative Findings: Normal anatomy   Indications: Mr. Huegel is a 80 yo with urosepsis and polymicrobial bactereia that needs antibiotics. He cannot get a PICC line due to chronic renal insufficiency. A tunneled central line was requested. We discussed the placement and risk of bleeding, infection, pneumothorax, injury to vessels. He opted to proceed. He ate sherbert before the procedure so local was the only anesthesia used.   Procedure: The patient was brought into the operating room and monitored.   The right chest and neck was prepped and draped in the usual sterile fashion.  Preoperative antibiotics were not given as he is on antibiotics.   An Ultrasound was used to verify that the right internal jugular vein was patent.  One percent lidocaine was used for local anesthesia.  The patient was measured and the 6 [place] Dual Lumen Powerline was used and measured at about 25 cm to the superior vena cava/ atrial junction.   The needle advanced into the right internal jugular vein using the Seldinger technique without difficulty.  A guidewire was then advanced into the right atrium under fluoroscopic guidance.  Ectopia was not noted.  The wire was secured.  An incision was made over the right chest and the catheter was tunneled to the neck.  The ultrasound again confirmed the wire was going into the vein only. Dilators were used over the wire to dilate  the track.  An introducer and peel-away sheath were placed over the guidewire. The catheter was then inserted through the peel-away sheath and the peel-away sheath was removed.  A spot film was performed to confirm the position.  The catheter drew back and flushed easily. The lumens were flushed with heparin. Hemostats were used to position the catheter in the neck incision. The neck incision was closed with 4-0 Monocryl and Dermabond. The catheter was secured with 2-0 silk suture and a sterile Biopatch and dressing was applied.  Hemostasis was confirmed.     All tape and needle counts were correct at the end of the procedure. The patient was transferred to PACU in stable condition. A chest x-ray will be performed at that time.  Curlene Labrum, MD [facility] [address] [place], [place] [address] [phone] (office)

## 2020-09-11 NOTE — Anesthesia Preprocedure Evaluation (Signed)
Anesthesia Evaluation  Patient identified by MRN, date of birth, ID band Patient awake    Reviewed: Allergy & Precautions, NPO status , Patient's Chart, lab work & pertinent test results, reviewed documented beta blocker date and time   Airway Mallampati: II  TM Distance: >3 FB Neck ROM: Full    Dental  (+) Poor Dentition, Dental Advisory Given, Missing   Pulmonary sleep apnea , pneumonia, former smoker,    Pulmonary exam normal breath sounds clear to auscultation       Cardiovascular Exercise Tolerance: Poor hypertension, Pt. on medications and Pt. on home beta blockers + CAD, + Past MI, + Peripheral Vascular Disease and +CHF  Normal cardiovascular exam+ dysrhythmias (PAT)  Rhythm:Regular Rate:Normal  Left ventricle: The cavity size was normal. Kenealy thickness was increased in a pattern of mild LVH. Systolic function was normal.  The estimated ejection fraction was in the range of 50% to 55%.  There is hypokinesis of the basalinferior myocardium. Doppler parameters are consistent with abnormal left ventricular relaxation (grade 1 diastolic dysfunction).  - Aortic valve: A bicuspid morphology cannot be excluded. Moderate  thickening and calcification involving the noncoronary cusp.  Valve mobility was restricted. Mean gradient (S): 4 mm Hg. Peak  gradient (S): 10 mm Hg. VTI ratio of LVOT to aortic valve: 0.61.  Valve area (Vmax): 1.98 cm^2.  - Mitral valve: There was mild regurgitation.  - Left atrium: The atrium was mildly dilated.  - Right atrium: Central venous pressure (est): 3 mm Hg.  - Atrial septum: No defect or patent foramen ovale was identified.  - Tricuspid valve: There was mild regurgitation.  - Pulmonary arteries: PA peak pressure: 39 mm Hg (S).  - Pericardium, extracardiac: There was no pericardial effusion.  22-Jul-2018 13:57:42 [facility] System-AP-300 ROUTINE RECORD Sinus bradycardia Left  ventricular hypertrophy with repolarization abnormality Abnormal ECG Confirmed by Sinda Du ([id number]) on 07/26/2018 12:31:27 PM    Neuro/Psych  Headaches, PSYCHIATRIC DISORDERS TIA Neuromuscular disease (multiple sclerosis) CVA, Residual Symptoms    GI/Hepatic negative GI ROS, CBD stone, S/P ERCP   Endo/Other  Hypothyroidism   Renal/GU ARF and Renal InsufficiencyRenal disease     Musculoskeletal  (+) Arthritis ,   Abdominal   Peds  Hematology  (+) Blood dyscrasia, anemia ,   Anesthesia Other Findings Pt is not NPO.  Will proceed with MAC, no sedation at all.    18-Apr-2020 13:03:26 [place] [facility]-AP-OPS ROUTINE RECORD Normal sinus rhythm Left ventricular hypertrophy with QRS widening and repolarization abnormality ( Sokolow-Lyon , Romhilt-Estes ) Cannot rule out Inferior infarct , age undetermined Abnormal ECG Confirmed by Sherren Mocha [phone]) on 04/19/2020 7:12:00 AM  Reproductive/Obstetrics                             Anesthesia Physical  Anesthesia Plan  ASA: IV  Anesthesia Plan: MAC   Post-op Pain Management:    Induction: Intravenous  PONV Risk Score and Plan: 3  Airway Management Planned: LMA  Additional Equipment:   Intra-op Plan:   Post-operative Plan: Extubation in OR  Informed Consent: I have reviewed the patients History and Physical, chart, labs and discussed the procedure including the risks, benefits and alternatives for the proposed anesthesia with the patient or authorized representative who has indicated his/her understanding and acceptance.     Dental advisory given  Plan Discussed with: Surgeon  Anesthesia Plan Comments:         Anesthesia Quick  Evaluation

## 2020-09-11 NOTE — TOC Transition Note (Signed)
Transition of Care [facility]) - CM/SW Discharge Note  Patient Details  Name: TIMOTHEUS SALM MRN: [id number] Date of Birth: 09-19-1941  [facility]) CM/SW Contact:  Sherie Don, LCSW Phone Number: 09/11/2020, 1:18 PM  Clinical Narrative: Patient ready for discharge. CSW notified Pam with [facility] regarding IV antibiotics orders. CSW notified Vaughan Basta with [facility] orders have been placed. Wife to set up PCP follow up appointment. TOC signing off.  Final next level of care: [place] Barriers to Discharge: Barriers Resolved  Patient Goals and CMS Choice Patient states their goals for this hospitalization and ongoing recovery are:: Return home with [facility] CMS Medicare.gov Compare Post Acute Care list provided to:: Patient Represenative (must comment) (Wife) Choice offered to / list presented to : Spouse  Discharge Plan and Services In-house Referral: Clinical Social Work Discharge Planning Services: NA Post Acute Care Choice: Home Health          DME Arranged: N/A DME Agency: NA HH Arranged: [facility] [place] Agency: [place] ([facility]) Date [place]: 09/11/20 Representative spoke with at [place]: Vaughan Basta  Readmission Risk Interventions Readmission Risk Prevention Plan 09/06/2020  Transportation Screening Complete  [place] or Home Care Consult Complete  Social Work Consult for [place] Planning/Counseling Complete  Palliative Care Screening Not Applicable  Medication Review Press photographer) Complete  Some recent data might be hidden

## 2020-09-11 NOTE — Discharge Summary (Signed)
Physician Discharge Summary  Jordan Ward JSE:[id number] DOB: Jun 07, 1941 DOA: 09/05/2020  PCP: Noreene Larsson, NP  Admit date: 09/05/2020 Discharge date: 09/11/2020  Admitted From: Home Disposition: Home with home health  Recommendations for Outpatient Follow-up:  1. Follow up with PCP in 1-2 weeks 2. Please obtain BMP/CBC in one week 3. Schedule follow-up with urology.  Home Health: PT/OT/speech/home infusion Equipment/Devices: Home infusion therapy  Discharge Condition: Stable CODE STATUS: Full code Diet recommendation: Regular diet, high-protein diet  Discharge summary: 79 year old gentleman with extensive medical comorbidities including coronary artery disease, multiple sclerosis and debility, paroxysmal A. fib, recent history of COVID with respiratory failure and now on oxygen, congenital atrophic right kidney, stone in the left kidney, neurogenic bladder with chronic suprapubic catheter admitted to the hospital secondary to sepsis.  Patient underwent cystoscopy, double-J stent removal and suprapubic catheter exchange on 12/7, very next day started having fever and left flank pain so came to the ER where he was found with fever, tachycardia, WBC count of 18.4, creatinine 3.39.  He was treated with IV fluids and antibiotics and taken to the OR for left ureteric stent placement. 12/9, patient grew Enterococcus faecalis, Pseudomonas and Morganella in his blood.   Repeat cultures negative.    Sepsis present on admission secondary to obstructive uropathy/infected kidney stone/bacteremia due to polymicrobial organism including Enterococcus faecalis, Pseudomonas and Morganella: Status post cystoscopy and left ureteric stent placement and clinically stabilizing. Treated with different antibiotics, followed by infectious disease. Repeat cultures with quick defervescence.  2D echocardiogram normal. Total 2 weeks of antibiotic therapy recommended, discharging home on meropenem and  amoxicillin.  Getting tunneled line today by surgery because of CKD stage IV.  Acute on chronic renal failure: Baseline stage IV chronic kidney disease, atrophic right kidney.  Baseline creatinine about 1.9-2.  Treated with isotonic fluid with good clinical improvement.  Renal functions improving back to his baseline.  Followed by nephrology.  Will need ongoing follow-up outpatient with nephrology.  Hyperglycemia: Steroid induced hypoglycemia with recent COVID-19 infection.  A1c 6.1.  Blood sugars less than 100.  Poor appetite.  Discontinue treatment.  Encouraged high-protein and high-calorie diet.  Electrolyte abnormalities: Corrected with improvement.  COPD with chronic hypoxic failure on 2 L oxygen at home: At about his baseline.  Multiple sclerosis/multiple medical comorbidities and debility: Will benefit with ongoing PT OT.  Will also need to speech therapy evaluation and skilled speech at home.  Will need to skilled nursing at home for IV antibiotic infusions.  Patient is medically stable to discharge today after undergoing tunnel catheter placement.  Will have follow-up with urology, ID and nephrology.    Discharge Diagnoses:  Principal Problem:   Polymicrobial septicemia ([place]) Active Problems:   Obstructive uropathy   Acute kidney injury superimposed on CKD [facility])    Discharge Instructions  Discharge Instructions    [facility] pharmacist to adjust dose for Vancomycin, Aminoglycosides and other anti-infective therapies as requested by physician.   Complete by: As directed    [facility] to provide Cath Flo 51m   Complete by: As directed    Administer for PICC line occlusion and as ordered by physician for other access device issues.   Anaphylaxis Kit: Provided to treat any anaphylactic reaction to the medication being provided to the patient if First Dose or when requested by physician   Complete by: As directed    Epinephrine 136mml vial / amp:  Administer 0.49m8m0.49ml46mubcutaneously once for moderate to severe anaphylaxis, nurse  to call physician and pharmacy when reaction occurs and call 911 if needed for immediate care   Diphenhydramine 51m/ml IV vial: Administer 25-520mIV/IM PRN for first dose reaction, rash, itching, mild reaction, nurse to call physician and pharmacy when reaction occurs   Sodium Chloride 0.9% NS 50090mV: Administer if needed for hypovolemic blood pressure drop or as ordered by physician after call to physician with anaphylactic reaction   Call MD for:  persistant nausea and vomiting   Complete by: As directed    Call MD for:  severe uncontrolled pain   Complete by: As directed    Change dressing on IV access line weekly and PRN   Complete by: As directed    Diet general   Complete by: As directed    Discharge wound care:   Complete by: As directed    Keep catheter site clean and dry, change clean gauze as needed. Keep your back wounds clean and dry, frequent change positions, can use paddings to protect your buttock.   Flush IV access with Sodium Chloride 0.9% and Heparin 10 units/ml or 100 units/ml   Complete by: As directed    Home infusion instructions - [facility]   Complete by: As directed    Instructions: Flush IV access with Sodium Chloride 0.9% and Heparin 10units/ml or 100units/ml   Change dressing on IV access line: Weekly and PRN   Instructions Cath Flo 2mg58mdminister for PICC Line occlusion and as ordered by physician for other access device   [facility] pharmacist to adjust dose for: Vancomycin, Aminoglycosides and other anti-infective therapies as requested by physician   Increase activity slowly   Complete by: As directed    Method of administration may be changed at the discretion of home infusion pharmacist based upon assessment of the patient and/or caregiver's ability to self-administer the medication ordered   Complete by: As directed      Allergies as of  09/11/2020      Reactions   Tetracyclines & Related Anaphylaxis, Rash   Ciprofloxacin    Trouble swallowing unknown reaction according to wife       Medication List    STOP taking these medications   cephALEXin 250 MG capsule Commonly known as: KEFLEX   linagliptin 5 MG Tabs tablet Commonly known as: TRADJENTA     TAKE these medications   acetaminophen 325 MG tablet Commonly known as: TYLENOL Take 2 tablets (650 mg total) by mouth every 6 (six) hours as needed for mild pain or headache (fever >/= 101).   amoxicillin 500 MG capsule Commonly known as: AMOXIL Take 1 capsule (500 mg total) by mouth every 12 (twelve) hours for 21 doses.   ascorbic acid 500 MG tablet Commonly known as: VITAMIN C Take 1 tablet (500 mg total) by mouth 2 (two) times daily.   diazepam 5 MG tablet Commonly known as: VALIUM Take 1 tablet (5 mg total) by mouth every 12 (twelve) hours as needed for muscle spasms.   guaiFENesin-dextromethorphan 100-10 MG/5ML syrup Commonly known as: ROBITUSSIN DM Take 10 mLs by mouth every 4 (four) hours as needed for cough.   HYDROcodone-acetaminophen 7.5-325 MG tablet Commonly known as: NORCO Take 1 tablet by mouth 2 (two) times daily. Max APAP 3 GM IN 24 HOURS FROM ALL SOURCES What changed:   when to take this  additional instructions   Ipratropium-Albuterol 20-100 MCG/ACT Aers respimat Commonly known as: COMBIVENT Inhale 1 puff into the lungs every 6 (six) hours as  needed for wheezing.   levothyroxine 75 MCG tablet Commonly known as: SYNTHROID Take 75 mcg by mouth daily before breakfast. ALL MEDICATIONS TO BE CRUSHED AND PLACED IN APPLESAUCE   meropenem  IVPB Commonly known as: MERREM Inject 500 mg into the vein daily for 10 days. Indication:  Bacteremia for Pseudomonas and Morganella First Dose: No Last Day of Therapy:  12/23 Labs - Once weekly:  CBC/D and BMP, Labs - Every other week:  ESR and CRP Method of administration: Mini-Bag Plus /  Gravity Method of administration may be changed at the discretion of home infusion pharmacist based upon assessment of the patient and/or caregiver's ability to self-administer the medication ordered.   multivitamin with minerals Tabs tablet Take 1 tablet by mouth daily.   Resource ThickenUp Clear Powd Use as directed            Discharge Care Instructions  (From admission, onward)         Start     Ordered   09/11/20 0000  Change dressing on IV access line weekly and PRN  (Home infusion instructions - [facility] )        09/11/20 1129   09/11/20 0000  Discharge wound care:       Comments: Keep catheter site clean and dry, change clean gauze as needed. Keep your back wounds clean and dry, frequent change positions, can use paddings to protect your buttock.   09/11/20 1129          Follow-up Information    Franchot Gallo, MD.   Specialty: Urology Contact information: [address] [place] [address] [phone]        [facility].   Specialty: Home Health Services Why: RN, PT, OT, ST       Re Follow up.              Allergies  Allergen Reactions  . Tetracyclines & Related Anaphylaxis and Rash  . Ciprofloxacin     Trouble swallowing unknown reaction according to wife     Consultations:  Infectious disease  Nephrology  Neurology  General surgery for vascular access   Procedures/Studies: DG Retrograde Pyelogram  Result Date: 09/05/2020 CLINICAL DATA:  Left hydronephrosis.  Left ureter stent placement. EXAM: RETROGRADE PYELOGRAM COMPARISON:  CT 09/05/2020 FINDINGS: Intraoperative images demonstrate placement of a left double-J ureter stent. Proximal and distal aspect of the stent appear to be in the expected locations. IMPRESSION: Placement of left ureter stent. Electronically Signed   By: Markus Daft M.D.   On: 09/05/2020 08:43   ECHOCARDIOGRAM COMPLETE  Result Date: 09/06/2020     ECHOCARDIOGRAM REPORT   Patient Name:   Jordan Ward Date of Exam: 09/06/2020 Medical Rec #:  [id number]    Height:       63.0 in Accession #:    [id number]   Weight:       108.0 lb Date of Birth:  06-29-1941    BSA:          1.488 m Patient Age:    67 years     BP:           134/70 mmHg Patient Gender: M            HR:           73 bpm. Exam Location:  [facility] Procedure: 2D Echo Indications:    Bacteremia 790.7 / R78.81  History:  Patient has prior history of Echocardiogram examinations, most                 recent 06/15/2017. CHF, Stroke, Signs/Symptoms:Bacteremia; Risk                 Factors:Hypertension and Former Smoker. Pulmonary Fibroisis,                 Pneumonia due to Covid.  Sonographer:    Leavy Cella RDCS (AE) Referring Phys: [id number] [place]  1. Left ventricular ejection fraction, by estimation, is 50 to 55%. The left ventricle has low normal function. The left ventricle demonstrates regional Rinks motion abnormalities (see scoring diagram/findings for description). There is mild left ventricular hypertrophy. Left ventricular diastolic parameters are consistent with Grade I diastolic dysfunction (impaired relaxation).  2. Right ventricular systolic function is mildly reduced. The right ventricular size is normal. There is normal pulmonary artery systolic pressure. The estimated right ventricular systolic pressure is 91.4 mmHg.  3. The mitral valve is grossly normal. Trivial mitral valve regurgitation.  4. The aortic valve is tricuspid. There is moderate calcification of the aortic valve. Aortic valve regurgitation is not visualized. Mild to moderate aortic valve sclerosis/calcification is present, without any evidence of aortic stenosis.  5. The inferior vena cava is normal in size with greater than 50% respiratory variability, suggesting right atrial pressure of 3 mmHg.  6. No obvious valvular vegetations. FINDINGS  Left Ventricle: Left ventricular ejection fraction, by  estimation, is 50 to 55%. The left ventricle has low normal function. The left ventricle demonstrates regional Geil motion abnormalities. The left ventricular internal cavity size was normal in size. There is mild left ventricular hypertrophy. Left ventricular diastolic parameters are consistent with Grade I diastolic dysfunction (impaired relaxation).  LV Boom Scoring: The posterior Sellers is hypokinetic. The antero-lateral Boyett, entire septum, apical lateral segment, and apex are normal. Right Ventricle: The right ventricular size is normal. No increase in right ventricular Mayabb thickness. Right ventricular systolic function is mildly reduced. There is normal pulmonary artery systolic pressure. The tricuspid regurgitant velocity is 2.51 m/s, and with an assumed right atrial pressure of 3 mmHg, the estimated right ventricular systolic pressure is 78.2 mmHg. Left Atrium: Left atrial size was normal in size. Right Atrium: Right atrial size was normal in size. Pericardium: There is no evidence of pericardial effusion. Mitral Valve: The mitral valve is grossly normal. Trivial mitral valve regurgitation. Tricuspid Valve: The tricuspid valve is grossly normal. Tricuspid valve regurgitation is mild. Aortic Valve: The aortic valve is tricuspid. There is moderate calcification of the aortic valve. There is mild aortic valve annular calcification. Aortic valve regurgitation is not visualized. Mild to moderate aortic valve sclerosis/calcification is present, without any evidence of aortic stenosis. Pulmonic Valve: The pulmonic valve was grossly normal. Pulmonic valve regurgitation is trivial. Aorta: The aortic root is normal in size and structure. Venous: The inferior vena cava is normal in size with greater than 50% respiratory variability, suggesting right atrial pressure of 3 mmHg. IAS/Shunts: No atrial level shunt detected by color flow Doppler.  LEFT VENTRICLE PLAX 2D LVIDd:         3.95 cm  Diastology LVIDs:         3.68  cm  LV e' medial:    3.59 cm/s LV PW:         1.20 cm  LV E/e' medial:  14.3 LV IVS:        1.24 cm  LV e' lateral:  4.03 cm/s LVOT diam:     2.00 cm  LV E/e' lateral: 12.8 LVOT Area:     3.14 cm  RIGHT VENTRICLE RV S prime:     13.80 cm/s TAPSE (M-mode): 2.0 cm LEFT ATRIUM           Index       RIGHT ATRIUM           Index LA diam:      3.40 cm 2.28 cm/m  RA Area:     11.90 cm LA Vol (A2C): 55.6 ml 37.36 ml/m RA Volume:   28.30 ml  19.02 ml/m LA Vol (A4C): 23.6 ml 15.86 ml/m  MITRAL VALVE               TRICUSPID VALVE MV Area (PHT): 3.06 cm    TR Peak grad:   25.2 mmHg MV Decel Time: 248 msec    TR Vmax:        251.00 cm/s MV E velocity: 51.40 cm/s MV A velocity: 75.80 cm/s  SHUNTS MV E/A ratio:  0.68        Systemic Diam: 2.00 cm Rozann Lesches MD Electronically signed by Rozann Lesches MD Signature Date/Time: 09/06/2020/4:32:25 PM    Final    CT Renal Stone Study  Result Date: 09/05/2020 CLINICAL DATA:  Urinary retention. EXAM: CT ABDOMEN AND PELVIS WITHOUT CONTRAST TECHNIQUE: Multidetector CT imaging of the abdomen and pelvis was performed following the standard protocol without IV contrast. COMPARISON:  05/02/2020 FINDINGS: Lower chest: There are coarse reticular airspace opacities at the lung bases bilaterally with associated ground-glass opacification and bronchiectasis. This is new since the patient's CT dated 05/02/2020.The heart is enlarged. The intracardiac blood pool is hypodense relative to the adjacent myocardium consistent with anemia. Hepatobiliary: There is no discrete hepatic mass. The patient is status post prior cholecystectomy.Again noted is pneumobilia. There is significant intrahepatic and extrahepatic biliary ductal dilatation. Pancreas: Normal contours without ductal dilatation. No peripancreatic fluid collection. Spleen: The spleen appears to be borderline enlarged. Adrenals/Urinary Tract: --Adrenal glands: Unremarkable. --Right kidney/ureter: The right kidney is atrophic with  multiple nonobstructing stones. --Left kidney/ureter: There is severe left-sided hydroureteronephrosis secondary to a 1 cm stone in the distal left ureter. Additional nonobstructing stones are noted in the left kidney --Urinary bladder: The urinary bladder is decompressed with a suprapubic catheter in place multiple stones are noted within the decompressed urinary bladder. Stomach/Bowel: --Stomach/Duodenum: No hiatal hernia or other gastric abnormality. Normal duodenal course and caliber. --Small bowel: Unremarkable. --Colon: Unremarkable. --Appendix: Surgically absent. Vascular/Lymphatic: There are advanced vascular calcifications of the abdominal aorta, again with findings suspicious for occlusion of the distal infrarenal abdominal aorta at the level of the aortic bifurcation. 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 --there is a left periaortic lymph node that is enlarged measuring approximately 1.2 cm in the short axis (axial series 2, image 29). There are additional smaller mildly enlarged retroperitoneal lymph nodes that are presumably reactive. --No mesenteric lymphadenopathy. --No pelvic or inguinal lymphadenopathy. Reproductive: The prostate gland is enlarged. Other: No ascites or free air. The abdominal Devito is normal. Musculoskeletal. 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 IMPRESSION: 1. Severe left-sided hydroureteronephrosis secondary to a 1 cm stone in the distal left ureter. 2. Bilateral nonobstructing nephrolithiasis.  Atrophic right kidney. 3. There are coarse reticular airspace opacities at the lung bases bilaterally with associated ground-glass opacification and bronchiectasis. This is new since the patient's CT dated 05/02/2020. Given the short interval  development, findings are favored to be secondary to an infectious or inflammatory process such as viral pneumonia. 4. Intrahepatic and extrahepatic biliary ductal dilatation. Correlation with  laboratory studies is recommended. 5. Anemia. Aortic Atherosclerosis (ICD10-I70.0). Electronically Signed   By: Constance Holster M.D.   On: 09/05/2020 03:16   (Echo, Carotid, EGD, Colonoscopy, ERCP)    Subjective: Patient seen and examined.  No overnight events.  Diarrhea has improved.  Only had 2 episodes since yesterday morning.  Denies any abdominal pain.  Plenty of  leakage around the suprapubic catheter.  Has some pain on his buttock.  Anticipating to go home.   Discharge Exam: Vitals:   09/10/20 2140 09/11/20 0537  BP: 129/75 (!) 162/84  Pulse: 69 70  Resp: 17 16  Temp: 98.4 F (36.9 C)   SpO2: 100% 100%   Vitals:   09/09/20 2142 09/10/20 1437 09/10/20 2140 09/11/20 0537  BP: (!) 178/85 128/77 129/75 (!) 162/84  Pulse: 69 73 69 70  Resp: _0 Temp: 98.6 F (37 C) 98.4 F (36.9 C) 98.4 F (36.9 C)   TempSrc: Oral Oral    SpO2: 100% 100% 100% 100%  Weight:      Height:        General: Pt is alert, awake, not in acute distress Thin and frail looking gentleman on 2 L oxygen. Cardiovascular: RRR, S1/S2 +, no rubs, no gallops Respiratory: CTA bilaterally, no wheezing, no rhonchi Abdominal: Soft, NT, ND, bowel sounds +, suprapubic catheter with clear urine.  Soaked with urine around the catheter site. Extremities: no edema, no cyanosis    The results of significant diagnostics from this hospitalization (including imaging, microbiology, ancillary and laboratory) are listed below for reference.     Microbiology: Recent Results (from the past 240 hour(s))  Resp Panel by RT-PCR (Flu A&B, Covid) Nasopharyngeal Swab     Status: None   Collection Time: 09/05/20  5:16 AM   Specimen: Nasopharyngeal Swab; Nasopharyngeal(NP) swabs in vial transport medium  Result Value Ref Range Status   SARS Coronavirus 2 by RT PCR NEGATIVE NEGATIVE Final    Comment: (NOTE) SARS-CoV-2 target nucleic acids are NOT DETECTED.  The SARS-CoV-2 RNA is generally detectable in upper  respiratory specimens during the acute phase of infection. The lowest concentration of SARS-CoV-2 viral copies this assay can detect is 138 copies/mL. A negative result does not preclude SARS-Cov-2 infection and should not be used as the sole basis for treatment or other patient management decisions. A negative result may occur with  improper specimen collection/handling, submission of specimen other than nasopharyngeal swab, presence of viral mutation(s) within the areas targeted by this assay, and inadequate number of viral copies(<138 copies/mL). A negative result must be combined with clinical observations, patient history, and epidemiological information. The expected result is Negative.  Fact Sheet for Patients:  EntrepreneurPulse.com.au  Fact Sheet for Healthcare Providers:  IncredibleEmployment.be  This test is no t yet approved or cleared by the [place] FDA and  has been authorized for detection and/or diagnosis of SARS-CoV-2 by FDA under an Emergency Use Authorization (EUA). This EUA will remain  in effect (meaning this test can be used) for the duration of the COVID-19 declaration under Section 564(b)(1) of the Act, 21 U.S.C.section 360bbb-3(b)(1), unless the authorization is terminated  or revoked sooner.       Influenza A by PCR NEGATIVE NEGATIVE Final   Influenza B by PCR NEGATIVE NEGATIVE Final    Comment: (NOTE) The Xpert Xpress  SARS-CoV-2/FLU/RSV plus assay is intended as an aid in the diagnosis of influenza from Nasopharyngeal swab specimens and should not be used as a sole basis for treatment. Nasal washings and aspirates are unacceptable for Xpert Xpress SARS-CoV-2/FLU/RSV testing.  Fact Sheet for Patients: EntrepreneurPulse.com.au  Fact Sheet for Healthcare Providers: IncredibleEmployment.be  This test is not yet approved or cleared by the [place] FDA and has been  authorized for detection and/or diagnosis of SARS-CoV-2 by FDA under an Emergency Use Authorization (EUA). This EUA will remain in effect (meaning this test can be used) for the duration of the COVID-19 declaration under Section 564(b)(1) of the Act, 21 U.S.C. section 360bbb-3(b)(1), unless the authorization is terminated or revoked.  Performed at [facility], [address], [place], [place] [address]   Blood culture (routine x 2)     Status: Abnormal   Collection Time: 09/05/20  6:10 AM   Specimen: BLOOD  Result Value Ref Range Status   Specimen Description   Final    BLOOD LEFT WRIST Performed at [facility], [address] [place], [place] [address]    Special Requests   Final    BOTTLES DRAWN AEROBIC AND ANAEROBIC Blood Culture adequate volume Performed at [facility], [address] [place], [place] [address]    Culture  Setup Time   Final    GRAM POSITIVE COCCI IN CHAINS GRAM NEGATIVE RODS AEROBIC AND ANAEROBIC BOTTLES Gram Stain Report Called to,Read Back By and Verified With: EVANS,H ON 09/05/20 AT 2340 BY LOY,C Performed at [facility] Performed at [facility], [address] [place], [place] [address]    Culture (A)  Final    ENTEROCOCCUS FAECALIS PSEUDOMONAS AERUGINOSA SUSCEPTIBILITIES PERFORMED ON PREVIOUS CULTURE WITHIN THE LAST 5 DAYS. Performed at [facility], [place] [address] [place], [place] [address]    Report Status 09/08/2020 FINAL  Final  Blood culture (routine x 2)     Status: Abnormal   Collection Time: 09/05/20  6:12 AM   Specimen: BLOOD  Result Value Ref Range Status   Specimen Description   Final    BLOOD RIGHT ANTECUBITAL Performed at [facility], [address] [place], [place] [address]    Special Requests   Final    BOTTLES DRAWN AEROBIC AND ANAEROBIC Blood Culture adequate volume Performed at [facility], [address] [place], [place] [address]    Culture  Setup Time   Final    GRAM POSITIVE COCCI IN CHAINS GRAM  NEGATIVE RODS BOTH ANAEROBIC AND AEROBIC BOTTLES Gram Stain Report Called to,Read Back By and Verified With: EVANS,H ON 09/05/20 AT 2340 BY LOY,C Performed at [facility] ID to follow Performed at [facility], [address] [place], [place] [address]    Culture (A)  Final    ENTEROCOCCUS FAECALIS PSEUDOMONAS AERUGINOSA MORGANELLA MORGANII    Report Status 09/10/2020 FINAL  Final   Organism ID, Bacteria ENTEROCOCCUS FAECALIS  Final   Organism ID, Bacteria PSEUDOMONAS AERUGINOSA  Final   Organism ID, Bacteria MORGANELLA MORGANII  Final      Susceptibility   Enterococcus faecalis - MIC*    AMPICILLIN <=2 SENSITIVE Sensitive     VANCOMYCIN 1 SENSITIVE Sensitive     GENTAMICIN SYNERGY SENSITIVE Sensitive     * ENTEROCOCCUS FAECALIS   Morganella morganii - MIC*    AMPICILLIN >=32 RESISTANT Resistant     CEFAZOLIN >=64 RESISTANT Resistant     CEFTAZIDIME 16 INTERMEDIATE Intermediate     CIPROFLOXACIN <=  0.25 SENSITIVE Sensitive     GENTAMICIN 2 SENSITIVE Sensitive     IMIPENEM INTERMEDIATE Intermediate     TRIMETH/SULFA 80 RESISTANT Resistant     AMPICILLIN/SULBACTAM >=32 RESISTANT Resistant     PIP/TAZO 8 SENSITIVE Sensitive     * MORGANELLA MORGANII   Pseudomonas aeruginosa - MIC*    CEFTAZIDIME 8 SENSITIVE Sensitive     CIPROFLOXACIN <=0.25 SENSITIVE Sensitive     GENTAMICIN <=1 SENSITIVE Sensitive     IMIPENEM 2 SENSITIVE Sensitive     CEFEPIME <=0.12 SENSITIVE Sensitive     * PSEUDOMONAS AERUGINOSA  Blood Culture ID Panel (Reflexed)     Status: Abnormal   Collection Time: 09/05/20  6:12 AM  Result Value Ref Range Status   Enterococcus faecalis DETECTED (A) NOT DETECTED Final    Comment: CRITICAL RESULT CALLED TO, READ BACK BY AND VERIFIED WITH: H EVANS RN 09/06/20 0432 JDW    Enterococcus Faecium NOT DETECTED NOT DETECTED Final   Listeria monocytogenes NOT DETECTED NOT DETECTED Final   Staphylococcus species NOT DETECTED NOT DETECTED Final    Staphylococcus aureus (BCID) NOT DETECTED NOT DETECTED Final   Staphylococcus epidermidis NOT DETECTED NOT DETECTED Final   Staphylococcus lugdunensis NOT DETECTED NOT DETECTED Final   Streptococcus species NOT DETECTED NOT DETECTED Final   Streptococcus agalactiae NOT DETECTED NOT DETECTED Final   Streptococcus pneumoniae NOT DETECTED NOT DETECTED Final   Streptococcus pyogenes NOT DETECTED NOT DETECTED Final   A.calcoaceticus-baumannii NOT DETECTED NOT DETECTED Final   Bacteroides fragilis NOT DETECTED NOT DETECTED Final   Enterobacterales DETECTED (A) NOT DETECTED Final    Comment: Enterobacterales represent a large order of gram negative bacteria, not a single organism. Refer to culture for further identification. CRITICAL RESULT CALLED TO, READ BACK BY AND VERIFIED WITH: H EVANS RN 09/06/20 0432 JDW    Enterobacter cloacae complex NOT DETECTED NOT DETECTED Final   Escherichia coli NOT DETECTED NOT DETECTED Final   Klebsiella aerogenes NOT DETECTED NOT DETECTED Final   Klebsiella oxytoca NOT DETECTED NOT DETECTED Final   Klebsiella pneumoniae NOT DETECTED NOT DETECTED Final   Proteus species NOT DETECTED NOT DETECTED Final   Salmonella species NOT DETECTED NOT DETECTED Final   Serratia marcescens NOT DETECTED NOT DETECTED Final   Haemophilus influenzae NOT DETECTED NOT DETECTED Final   Neisseria meningitidis NOT DETECTED NOT DETECTED Final   Pseudomonas aeruginosa DETECTED (A) NOT DETECTED Final    Comment: CRITICAL RESULT CALLED TO, READ BACK BY AND VERIFIED WITH: H EVANS RN 09/06/20 0432 JDW    Stenotrophomonas maltophilia NOT DETECTED NOT DETECTED Final   Candida albicans NOT DETECTED NOT DETECTED Final   Candida auris NOT DETECTED NOT DETECTED Final   Candida glabrata NOT DETECTED NOT DETECTED Final   Candida krusei NOT DETECTED NOT DETECTED Final   Candida parapsilosis NOT DETECTED NOT DETECTED Final   Candida tropicalis NOT DETECTED NOT DETECTED Final   Cryptococcus  neoformans/gattii NOT DETECTED NOT DETECTED Final   CTX-M ESBL NOT DETECTED NOT DETECTED Final   Carbapenem resistance IMP NOT DETECTED NOT DETECTED Final   Carbapenem resistance KPC NOT DETECTED NOT DETECTED Final   Carbapenem resistance NDM NOT DETECTED NOT DETECTED Final   Carbapenem resist OXA 48 LIKE NOT DETECTED NOT DETECTED Final   Vancomycin resistance NOT DETECTED NOT DETECTED Final   Carbapenem resistance VIM NOT DETECTED NOT DETECTED Final    Comment: Performed at [facility], [address] [place], [place] [address]  Culture, blood (routine x  2)     Status: None (Preliminary result)   Collection Time: 09/06/20  5:30 AM   Specimen: BLOOD RIGHT HAND  Result Value Ref Range Status   Specimen Description BLOOD RIGHT HAND  Final   Special Requests   Final    BOTTLES DRAWN AEROBIC ONLY Blood Culture results may not be optimal due to an inadequate volume of blood received in culture bottles   Culture   Final    NO GROWTH 4 DAYS Performed at [facility], [address] [place], [place] [address]    Report Status PENDING  Incomplete  Culture, blood (routine x 2)     Status: None (Preliminary result)   Collection Time: 09/06/20  5:40 AM   Specimen: BLOOD RIGHT HAND  Result Value Ref Range Status   Specimen Description BLOOD RIGHT HAND  Final   Special Requests   Final    BOTTLES DRAWN AEROBIC AND ANAEROBIC Blood Culture results may not be optimal due to an inadequate volume of blood received in culture bottles   Culture   Final    NO GROWTH 4 DAYS Performed at [facility], [address] [place], [place] [address]    Report Status PENDING  Incomplete     Labs: BNP (last 3 results) No results for input(s): BNP in the last 8760 hours. Basic Metabolic Panel: Recent Labs  Lab 09/05/20 0812 09/06/20 0429 09/07/20 0435 09/08/20 0431 09/09/20 0512 09/11/20 0513  NA  --  140 138 137 143 137  K  --  3.4* 3.3* 3.3* 4.2 3.5  CL  --  105 102 101 108 103  CO2  --  _0 GLUCOSE  --  89 97 92 107* 94  BUN  --  42* 43* 35* 34* 26*  CREATININE 3.67* 3.69* 3.70* 3.06* 3.05* 2.35*  CALCIUM  --  8.4* 7.9* 8.1* 8.4* 8.9  MG 1.6*  --  1.7  --   --   --   PHOS  --   --  4.5  --   --  3.7   Liver Function Tests: Recent Labs  Lab 09/11/20 0513  ALBUMIN 2.7*   No results for input(s): LIPASE, AMYLASE in the last 168 hours. No results for input(s): AMMONIA in the last 168 hours. CBC: Recent Labs  Lab 09/07/20 0435 09/08/20 0431 09/09/20 0512 09/10/20 0635 09/11/20 0513  WBC 11.2* 7.3 6.4 7.2 7.3  NEUTROABS 8.3* 4.4 3.5 4.3 3.7  HGB 7.2* 8.1* 8.2* 8.0* 8.3*  HCT 23.8* 26.7* 28.0* 27.4* 27.3*  MCV 98.8 96.7 100.4* 100.0 97.5  PLT 203 223 230 247 256   Cardiac Enzymes: No results for input(s): CKTOTAL, CKMB, CKMBINDEX, TROPONINI in the last 168 hours. BNP: Invalid input(s): POCBNP CBG: No results for input(s): GLUCAP in the last 168 hours. D-Dimer No results for input(s): DDIMER in the last 72 hours. Hgb A1c No results for input(s): HGBA1C in the last 72 hours. Lipid Profile No results for input(s): CHOL, HDL, LDLCALC, TRIG, CHOLHDL, LDLDIRECT in the last 72 hours. Thyroid function studies No results for input(s): TSH, T4TOTAL, T3FREE, THYROIDAB in the last 72 hours.  Invalid input(s): FREET3 Anemia work up No results for input(s): VITAMINB12, FOLATE, FERRITIN, TIBC, IRON, RETICCTPCT in the last 72 hours. Urinalysis    Component Value Date/Time   COLORURINE STRAW (A) 09/05/2020 0926   APPEARANCEUR HAZY (A) 09/05/2020 0926   LABSPEC 1.004 (L) 09/05/2020 0926   PHURINE 7.0 09/05/2020 0926   GLUCOSEU  NEGATIVE 09/05/2020 0926   HGBUR MODERATE (A) 09/05/2020 0926   BILIRUBINUR NEGATIVE 09/05/2020 0926   KETONESUR NEGATIVE 09/05/2020 0926   PROTEINUR 30 (A) 09/05/2020 0926   UROBILINOGEN 0.2 07/17/2014 1215   NITRITE NEGATIVE 09/05/2020 0926   LEUKOCYTESUR LARGE (A) 09/05/2020 0926   Sepsis Labs Invalid input(s):  PROCALCITONIN,  WBC,  LACTICIDVEN Microbiology Recent Results (from the past 240 hour(s))  Resp Panel by RT-PCR (Flu A&B, Covid) Nasopharyngeal Swab     Status: None   Collection Time: 09/05/20  5:16 AM   Specimen: Nasopharyngeal Swab; Nasopharyngeal(NP) swabs in vial transport medium  Result Value Ref Range Status   SARS Coronavirus 2 by RT PCR NEGATIVE NEGATIVE Final    Comment: (NOTE) SARS-CoV-2 target nucleic acids are NOT DETECTED.  The SARS-CoV-2 RNA is generally detectable in upper respiratory specimens during the acute phase of infection. The lowest concentration of SARS-CoV-2 viral copies this assay can detect is 138 copies/mL. A negative result does not preclude SARS-Cov-2 infection and should not be used as the sole basis for treatment or other patient management decisions. A negative result may occur with  improper specimen collection/handling, submission of specimen other than nasopharyngeal swab, presence of viral mutation(s) within the areas targeted by this assay, and inadequate number of viral copies(<138 copies/mL). A negative result must be combined with clinical observations, patient history, and epidemiological information. The expected result is Negative.  Fact Sheet for Patients:  EntrepreneurPulse.com.au  Fact Sheet for Healthcare Providers:  IncredibleEmployment.be  This test is no t yet approved or cleared by the [place] FDA and  has been authorized for detection and/or diagnosis of SARS-CoV-2 by FDA under an Emergency Use Authorization (EUA). This EUA will remain  in effect (meaning this test can be used) for the duration of the COVID-19 declaration under Section 564(b)(1) of the Act, 21 U.S.C.section 360bbb-3(b)(1), unless the authorization is terminated  or revoked sooner.       Influenza A by PCR NEGATIVE NEGATIVE Final   Influenza B by PCR NEGATIVE NEGATIVE Final    Comment: (NOTE) The Xpert Xpress  SARS-CoV-2/FLU/RSV plus assay is intended as an aid in the diagnosis of influenza from Nasopharyngeal swab specimens and should not be used as a sole basis for treatment. Nasal washings and aspirates are unacceptable for Xpert Xpress SARS-CoV-2/FLU/RSV testing.  Fact Sheet for Patients: EntrepreneurPulse.com.au  Fact Sheet for Healthcare Providers: IncredibleEmployment.be  This test is not yet approved or cleared by the [place] FDA and has been authorized for detection and/or diagnosis of SARS-CoV-2 by FDA under an Emergency Use Authorization (EUA). This EUA will remain in effect (meaning this test can be used) for the duration of the COVID-19 declaration under Section 564(b)(1) of the Act, 21 U.S.C. section 360bbb-3(b)(1), unless the authorization is terminated or revoked.  Performed at [facility], [address] [place], [place] [address]   Blood culture (routine x 2)     Status: Abnormal   Collection Time: 09/05/20  6:10 AM   Specimen: BLOOD  Result Value Ref Range Status   Specimen Description   Final    BLOOD LEFT WRIST Performed at [facility], [address] [place], [place] [address]    Special Requests   Final    BOTTLES DRAWN AEROBIC AND ANAEROBIC Blood Culture adequate volume Performed at [facility], [address] [place], [place] [address]    Culture  Setup Time   Final    GRAM POSITIVE COCCI IN CHAINS GRAM NEGATIVE RODS AEROBIC AND  ANAEROBIC BOTTLES Gram Stain Report Called to,Read Back By and Verified With: EVANS,H ON 09/05/20 AT 2340 BY LOY,C Performed at [facility] Performed at [facility], [address] [place], [place] [address]    Culture (A)  Final    ENTEROCOCCUS FAECALIS PSEUDOMONAS AERUGINOSA SUSCEPTIBILITIES PERFORMED ON PREVIOUS CULTURE WITHIN THE LAST 5 DAYS. Performed at [facility], [place] [address] [place], [place] [address]    Report Status 09/08/2020 FINAL  Final  Blood  culture (routine x 2)     Status: Abnormal   Collection Time: 09/05/20  6:12 AM   Specimen: BLOOD  Result Value Ref Range Status   Specimen Description   Final    BLOOD RIGHT ANTECUBITAL Performed at [facility], [address] [place], [place] [address]    Special Requests   Final    BOTTLES DRAWN AEROBIC AND ANAEROBIC Blood Culture adequate volume Performed at [facility], [address] [place], [place] [address]    Culture  Setup Time   Final    GRAM POSITIVE COCCI IN CHAINS GRAM NEGATIVE RODS BOTH ANAEROBIC AND AEROBIC BOTTLES Gram Stain Report Called to,Read Back By and Verified With: EVANS,H ON 09/05/20 AT 2340 BY LOY,C Performed at [facility] Organism ID to follow Performed at [facility], [address] [place], [place] [address]    Culture (A)  Final    ENTEROCOCCUS FAECALIS PSEUDOMONAS AERUGINOSA MORGANELLA MORGANII    Report Status 09/10/2020 FINAL  Final   Organism ID, Bacteria ENTEROCOCCUS FAECALIS  Final   Organism ID, Bacteria PSEUDOMONAS AERUGINOSA  Final   Organism ID, Bacteria MORGANELLA MORGANII  Final      Susceptibility   Enterococcus faecalis - MIC*    AMPICILLIN <=2 SENSITIVE Sensitive     VANCOMYCIN 1 SENSITIVE Sensitive     GENTAMICIN SYNERGY SENSITIVE Sensitive     * ENTEROCOCCUS FAECALIS   Morganella morganii - MIC*    AMPICILLIN >=32 RESISTANT Resistant     CEFAZOLIN >=64 RESISTANT Resistant     CEFTAZIDIME 16 INTERMEDIATE Intermediate     CIPROFLOXACIN <=0.25 SENSITIVE Sensitive     GENTAMICIN 2 SENSITIVE Sensitive     IMIPENEM INTERMEDIATE Intermediate     TRIMETH/SULFA 80 RESISTANT Resistant     AMPICILLIN/SULBACTAM >=32 RESISTANT Resistant     PIP/TAZO 8 SENSITIVE Sensitive     * MORGANELLA MORGANII   Pseudomonas aeruginosa - MIC*    CEFTAZIDIME 8 SENSITIVE Sensitive     CIPROFLOXACIN <=0.25 SENSITIVE Sensitive     GENTAMICIN <=1 SENSITIVE Sensitive     IMIPENEM 2 SENSITIVE Sensitive     CEFEPIME <=0.12 SENSITIVE Sensitive      * PSEUDOMONAS AERUGINOSA  Blood Culture ID Panel (Reflexed)     Status: Abnormal   Collection Time: 09/05/20  6:12 AM  Result Value Ref Range Status   Enterococcus faecalis DETECTED (A) NOT DETECTED Final    Comment: CRITICAL RESULT CALLED TO, READ BACK BY AND VERIFIED WITH: H EVANS RN 09/06/20 0432 JDW    Enterococcus Faecium NOT DETECTED NOT DETECTED Final   Listeria monocytogenes NOT DETECTED NOT DETECTED Final   Staphylococcus species NOT DETECTED NOT DETECTED Final   Staphylococcus aureus (BCID) NOT DETECTED NOT DETECTED Final   Staphylococcus epidermidis NOT DETECTED NOT DETECTED Final   Staphylococcus lugdunensis NOT DETECTED NOT DETECTED Final   Streptococcus species NOT DETECTED NOT DETECTED Final   Streptococcus agalactiae NOT DETECTED NOT DETECTED Final   Streptococcus pneumoniae NOT DETECTED NOT DETECTED Final  Streptococcus pyogenes NOT DETECTED NOT DETECTED Final   A.calcoaceticus-baumannii NOT DETECTED NOT DETECTED Final   Bacteroides fragilis NOT DETECTED NOT DETECTED Final   Enterobacterales DETECTED (A) NOT DETECTED Final    Comment: Enterobacterales represent a large order of gram negative bacteria, not a single organism. Refer to culture for further identification. CRITICAL RESULT CALLED TO, READ BACK BY AND VERIFIED WITH: H EVANS RN 09/06/20 0432 JDW    Enterobacter cloacae complex NOT DETECTED NOT DETECTED Final   Escherichia coli NOT DETECTED NOT DETECTED Final   Klebsiella aerogenes NOT DETECTED NOT DETECTED Final   Klebsiella oxytoca NOT DETECTED NOT DETECTED Final   Klebsiella pneumoniae NOT DETECTED NOT DETECTED Final   Proteus species NOT DETECTED NOT DETECTED Final   Salmonella species NOT DETECTED NOT DETECTED Final   Serratia marcescens NOT DETECTED NOT DETECTED Final   Haemophilus influenzae NOT DETECTED NOT DETECTED Final   Neisseria meningitidis NOT DETECTED NOT DETECTED Final   Pseudomonas aeruginosa DETECTED (A) NOT DETECTED Final     Comment: CRITICAL RESULT CALLED TO, READ BACK BY AND VERIFIED WITH: H EVANS RN 09/06/20 0432 JDW    Stenotrophomonas maltophilia NOT DETECTED NOT DETECTED Final   Candida albicans NOT DETECTED NOT DETECTED Final   Candida auris NOT DETECTED NOT DETECTED Final   Candida glabrata NOT DETECTED NOT DETECTED Final   Candida krusei NOT DETECTED NOT DETECTED Final   Candida parapsilosis NOT DETECTED NOT DETECTED Final   Candida tropicalis NOT DETECTED NOT DETECTED Final   Cryptococcus neoformans/gattii NOT DETECTED NOT DETECTED Final   CTX-M ESBL NOT DETECTED NOT DETECTED Final   Carbapenem resistance IMP NOT DETECTED NOT DETECTED Final   Carbapenem resistance KPC NOT DETECTED NOT DETECTED Final   Carbapenem resistance NDM NOT DETECTED NOT DETECTED Final   Carbapenem resist OXA 48 LIKE NOT DETECTED NOT DETECTED Final   Vancomycin resistance NOT DETECTED NOT DETECTED Final   Carbapenem resistance VIM NOT DETECTED NOT DETECTED Final    Comment: Performed at [facility], [address] [place], [place] [address]  Culture, blood (routine x 2)     Status: None (Preliminary result)   Collection Time: 09/06/20  5:30 AM   Specimen: BLOOD RIGHT HAND  Result Value Ref Range Status   Specimen Description BLOOD RIGHT HAND  Final   Special Requests   Final    BOTTLES DRAWN AEROBIC ONLY Blood Culture results may not be optimal due to an inadequate volume of blood received in culture bottles   Culture   Final    NO GROWTH 4 DAYS Performed at [facility], [address], [place], [place] [address]    Report Status PENDING  Incomplete  Culture, blood (routine x 2)     Status: None (Preliminary result)   Collection Time: 09/06/20  5:40 AM   Specimen: BLOOD RIGHT HAND  Result Value Ref Range Status   Specimen Description BLOOD RIGHT HAND  Final   Special Requests   Final    BOTTLES DRAWN AEROBIC AND ANAEROBIC Blood Culture results may not be optimal due to an inadequate volume of blood received in  culture bottles   Culture   Final    NO GROWTH 4 DAYS Performed at [facility], [address], [place], [place] [address]    Report Status PENDING  Incomplete     Time coordinating discharge:  45 minutes  SIGNED:   Barb Merino, MD  [facility] 09/11/2020, 11:37 AM

## 2020-09-11 NOTE — Consult Note (Signed)
[facility] Consult  Reason for Consult: Tunneled central line for bacteremia/ chronic kidney disease  Referring Physician:  Dr. Laurena Bering, MD   Chief Complaint    suprapubic cath problem      HPI: Jordan Ward is a 79 y.o. male with urosepsis and bacteremia who needs antibiotics for 2 weeks at home. He is unable to get a PICC due to chronic kidney disease and needs a tunneled internal jugular central line. I have been requested to place this and have been able to the catheter from [place].   The patient reports he is fair but has been in and out of the hospital several times in the last few months. I saw him in the past for a hernia that we opted to not repair due to his comorbidities and medical issues.   Past Medical History:  Diagnosis Date  . Anemia   . Arthritis   . Back pain, chronic   . Bilateral carotid bruits   . C. difficile colitis 09/2011  . CAD (coronary artery disease)   . Carotid artery stenosis   . Cerebrovascular disease   . Cerebrovascular disease 08/17/2018  . Colon polyps   . Complication of cystostomy catheter, initial encounter ([place]) 04/20/2020  . Dyslipidemia   . Dysphagia 10/07/2011  . Encephalopathy   . Gait disorder   . HA (headache)   . High grade dysplasia in colonic adenoma 09/2005  . History of kidney stones   . HTN (hypertension), malignant 10/06/2011  . Hypernatremia   . Hypokalemia   . Hypothyroidism 10/08/2011  . Insomnia   . Junctional rhythm   . Kidney stones   . MS (multiple sclerosis) ([place])   . Neuromuscular disorder (HCC)    MS  . OSA (obstructive sleep apnea)   . Paroxysmal atrial tachycardia ([place])   . Peripheral vascular disease ([place])   . Pneumonia 4 yrs ago  . Pulmonary fibrosis ([place]) 10/06/2011  . Pulmonary nodule 10/08/2011  . PVD (peripheral vascular disease) ([place])   . Sacral ulcer ([place])   . Sleep apnea    cannot tolerate  . Stroke [facility])    left sided weakness  . Suprapubic catheter ([place])   . TIA (transient  ischemic attack)   . Tremors of nervous system 10/08/2011  . Urinary tract infection   . Ventral hernia without obstruction or gangrene     Large 8X9cm ventral hernia with loss of domain. CT reads report as diastasis recti with herniation or diastasis recti.  Dr. Constance Haw, Surgery, reviewed CT with radiology and there is herniation with only hernia sac or peritoneum over the bowel and large separation of the rectus muslce (i.e. diastasis recti aka loss of domain).  No surgical intervention recommended given size, age, and health.     Past Surgical History:  Procedure Laterality Date  . APPENDECTOMY  09/2005   at time of left hemicolectomy  . BACK SURGERY  1976/1979   lower  . BILIARY DILATION N/A 03/03/2020   Procedure: BILIARY DILATION;  Surgeon: Rogene Houston, MD;  [facility];  Service: Endoscopy;  Laterality: N/A;  . CATARACT EXTRACTION W/PHACO Right 03/08/2018   Procedure: CATARACT EXTRACTION PHACO AND INTRAOCULAR LENS PLACEMENT RIGHT EYE;  Surgeon: Tonny Branch, MD;  Location: [facility];  Service: Ophthalmology;  Laterality: Right;  CDE: 8.86  . CATARACT EXTRACTION W/PHACO Left 04/05/2018   Procedure: CATARACT EXTRACTION PHACO AND INTRAOCULAR LENS PLACEMENT (IOC);  Surgeon: Tonny Branch, MD;  Location: [facility] ORS;  Service: Ophthalmology;  Laterality: Left;  CDE: 7.36  . CHOLECYSTECTOMY     Dr. Tamala Julian  . COLON SURGERY  09/2005   [place]: four tubular adenomas, large adenomatous polyp with HIGH GRADE dysplasia  . COLONOSCOPY  11/2004   Dr. Sharol Roussel sessile polyp splenic flexure, 75mm sessile polyp desc colon, tubulovillous adenoma (bx not removed)  . COLONOSCOPY  01/2005   poor prep, polyp could not be found  . COLONOSCOPY  05/2005   with EMR, polypectomy Dr. Olegario Messier, bx showed high grade dysplasia, partially resected  . COLONOSCOPY  09/2005   Dr. Arsenio Loader, [place] ink tattooing, four villous colon polyp (3 had been missed on previous colonoscopies due to limitations of procedures   . COLONOSCOPY  09/2006   normal TI, no polyps  . COLONOSCOPY  10/2007   Dr. Imogene Burn distal mammillations, benign bx, normal TI, random bx neg for microscopic colitis  . CYSTOSCOPY W/ URETERAL STENT PLACEMENT Left 09/05/2020   Procedure: CYSTOSCOPY WITH RETROGRADE PYELOGRAM/URETERAL STENT PLACEMENT;  Surgeon: Ardis Hughs, MD;  Location: [facility];  Service: Urology;  Laterality: Left;  . CYSTOSCOPY WITH LITHOLAPAXY N/A 07/27/2018   Procedure: CYSTOSCOPY WITH LITHOLAPAXY VIA  SUPRAPUBIC TUBE;  Surgeon: Franchot Gallo, MD;  Location: [facility];  Service: Urology;  Laterality: N/A;  . CYSTOSCOPY WITH RETROGRADE PYELOGRAM, URETEROSCOPY AND STENT PLACEMENT Left 06/09/2017   Procedure: CYSTOSCOPY WITH LEFT RETROGRADE PYELOGRAM, LEFT URETEROSCOPY, LEFT URETEROSCOPIC STONE EXTRACTION, LEFT URETERAL STENT PLACEMENT;  Surgeon: Franchot Gallo, MD;  Location: [facility];  Service: Urology;  Laterality: Left;  . CYSTOSCOPY/URETEROSCOPY/HOLMIUM LASER/STENT PLACEMENT Left 05/22/2020   Procedure: CYSTOSCOPY/URETEROSCOPY/STENT PLACEMENT;  Surgeon: Franchot Gallo, MD;  Location: [facility];  Service: Urology;  Laterality: Left;  . ERCP N/A 03/03/2020   Procedure: ENDOSCOPIC RETROGRADE CHOLANGIOPANCREATOGRAPHY (ERCP);  Surgeon: Rogene Houston, MD;  [facility];  Service: Endoscopy;  Laterality: N/A;  . ERCP N/A 04/20/2020   Procedure: ENDOSCOPIC RETROGRADE CHOLANGIOPANCREATOGRAPHY (ERCP);  Surgeon: Rogene Houston, MD;  [facility];  Service: Endoscopy;  Laterality: N/A;  to be done at 7:30am in OR  . GASTROINTESTINAL STENT REMOVAL N/A 04/20/2020   Procedure: STENT REMOVAL;  Surgeon: Rogene Houston, MD;  [facility];  Service: Endoscopy;  Laterality: N/A;  . INGUINAL HERNIA REPAIR  1971   bilateral  . INSERTION OF SUPRAPUBIC CATHETER  06/09/2017   Procedure: EXCHANGE OF SUPRAPUBIC CATHETER;  Surgeon: Franchot Gallo, MD;  Location: [facility];  Service: Urology;;  .  INSERTION OF SUPRAPUBIC CATHETER  05/22/2020   Procedure: SUPRAPUBIC CATHETER EXCHANGE;  Surgeon: Franchot Gallo, MD;  Location: [facility];  Service: Urology;;  . IR NEPHROSTOMY PLACEMENT LEFT  05/26/2017  . IR NEPHROSTOMY PLACEMENT LEFT  05/02/2020  . KIDNEY STONE SURGERY  09/13/2015  . LITHOTRIPSY N/A 03/03/2020   Procedure: MECHANICAL LITHOTRIPSY WITH REMOVAL OF MULTIPLE STONE FRAGMENTS;  Surgeon: Rogene Houston, MD;  [facility];  Service: Endoscopy;  Laterality: N/A;  . NEPHROLITHOTOMY Left 09/13/2015   Procedure: LEFT PERCUTANEOUS NEPHROLITHOTOMY ;  Surgeon: Franchot Gallo, MD;  Location: [facility];  Service: Urology;  Laterality: Left;  . NEPHROSTOMY TUBE REMOVAL  05/22/2020   Procedure: NEPHROSTOMY TUBE REMOVAL;  Surgeon: Franchot Gallo, MD;  Location: [facility];  Service: Urology;;  . REMOVAL OF STONES N/A 04/20/2020   Procedure: REMOVAL OF STONES;  Surgeon: Rogene Houston, MD;  [facility];  Service: Endoscopy;  Laterality: N/A;  . SPHINCTEROTOMY  03/03/2020   Procedure: BILLARY SPHINCTEROTOMY;  Surgeon: Hildred Laser  U, MD;  Location[facility];  Service: Endoscopy;;  . SUPRAPUBIC CATHETER INSERTION      Family History  Problem Relation Age of Onset  . Cirrhosis Brother        etoh  . Stroke Mother 36  . Coronary artery disease Father 34  . Heart attack Brother   . Cancer Sister   . Multiple sclerosis Other   . Colon cancer Neg Hx     Social History   Tobacco Use  . Smoking status: Former Smoker    Packs/day: 1.00    Years: 25.00    Pack years: 25.00    Types: Cigarettes    Quit date: 03/03/1988    Years since quitting: 32.5  . Smokeless tobacco: Never Used  Vaping Use  . Vaping Use: Never used  Substance Use Topics  . Alcohol use: No  . Drug use: No    Medications:  I have reviewed the patient's current medications. Prior to Admission:  Medications Prior to Admission  Medication Sig Dispense Refill Last Dose  . acetaminophen  (TYLENOL) 325 MG tablet Take 2 tablets (650 mg total) by mouth every 6 (six) hours as needed for mild pain or headache (fever >/= 101). 30 tablet 0   . ascorbic acid (VITAMIN C) 500 MG tablet Take 1 tablet (500 mg total) by mouth 2 (two) times daily. 30 tablet 0 Past Week at Unknown time  . cephALEXin (KEFLEX) 250 MG capsule Take 1 capsule (250 mg total) by mouth at bedtime. 30 capsule 0 Past Week at Unknown time  . diazepam (VALIUM) 5 MG tablet Take 1 tablet (5 mg total) by mouth every 12 (twelve) hours as needed for muscle spasms. 60 tablet 0 Past Week at Unknown time  . guaiFENesin-dextromethorphan (ROBITUSSIN DM) 100-10 MG/5ML syrup Take 10 mLs by mouth every 4 (four) hours as needed for cough. 118 mL 0 Past Week at Unknown time  . HYDROcodone-acetaminophen (NORCO) 7.5-325 MG per tablet Take 1 tablet by mouth 2 (two) times daily. Max APAP 3 GM IN 24 HOURS FROM ALL SOURCES (Patient taking differently: Take 1 tablet by mouth in the morning and at bedtime. ALL MEDICATIONS TO BE CRUSHED AND PLACED IN APPLESAUCE) 60 tablet 0 Past Week at Unknown time  . Ipratropium-Albuterol (COMBIVENT) 20-100 MCG/ACT AERS respimat Inhale 1 puff into the lungs every 6 (six) hours as needed for wheezing. 4 g 0 Past Week at Unknown time  . levothyroxine (SYNTHROID) 75 MCG tablet Take 75 mcg by mouth daily before breakfast. ALL MEDICATIONS TO BE CRUSHED AND PLACED IN APPLESAUCE   Past Week at Unknown time  . linagliptin (TRADJENTA) 5 MG TABS tablet Take 1 tablet (5 mg total) by mouth daily. 30 tablet 0 Past Week at Unknown time  . Maltodextrin-Xanthan Gum (RESOURCE THICKENUP CLEAR) POWD Use as directed 125 g g Past Week at Unknown time  . Multiple Vitamin (MULTIVITAMIN WITH MINERALS) TABS tablet Take 1 tablet by mouth daily. 30 tablet 0 Past Week at Unknown time   Scheduled: . amoxicillin  500 mg Oral Q12H  . ascorbic acid  500 mg Oral BID  . Chlorhexidine Gluconate Cloth  6 each Topical Once  . darbepoetin (ARANESP)  injection - NON-DIALYSIS  40 mcg Subcutaneous Q Mon-1800  . heparin  5,000 Units Subcutaneous Q8H  . HYDROcodone-acetaminophen  1 tablet Oral BID  . levothyroxine  75 mcg Oral QAC breakfast  .  morphine injection  4 mg Intravenous Once  . multivitamin with minerals  1 tablet Oral Daily   Continuous: . sodium chloride 1,000 mL (09/07/20 1801)  . meropenem (MERREM) IV 500 mg (09/10/20 1455)   WOE:HOZYYQ chloride, acetaminophen **OR** acetaminophen, diazepam, guaiFENesin-dextromethorphan, ipratropium-albuterol, Resource ThickenUp Clear  Allergies  Allergen Reactions  . Tetracyclines & Related Anaphylaxis and Rash  . Ciprofloxacin     Trouble swallowing unknown reaction according to wife      ROS:  A comprehensive review of systems was negative except for: Genitourinary: positive for urosepsis, bacteremia, suprapubic in place  Blood pressure (!) 162/84, pulse 70, temperature 98.4 F (36.9 C), resp. rate 16, height 5\' 3"  (1.6 m), weight 51.4 kg, SpO2 100 %. Physical Exam Vitals reviewed.  Constitutional:      Appearance: He is cachectic. He is not ill-appearing.  HENT:     Head: Normocephalic.     Nose: Nose normal.     Mouth/Throat:     Mouth: Mucous membranes are moist.  Neck:     Comments: No prior catheters in neck  Cardiovascular:     Rate and Rhythm: Normal rate.  Pulmonary:     Effort: Pulmonary effort is normal.  Abdominal:     General: There is no distension.     Palpations: Abdomen is soft.     Tenderness: There is no abdominal tenderness.  Skin:    General: Skin is warm.  Neurological:     General: No focal deficit present.     Mental Status: He is oriented to person, place, and time.  Psychiatric:        Mood and Affect: Mood normal.        Behavior: Behavior normal.        Thought Content: Thought content normal.        Judgment: Judgment normal.     Results: Results for orders placed or performed during the hospital encounter of 09/05/20 (from the  past 48 hour(s))  CBC with Differential/Platelet     Status: Abnormal   Collection Time: 09/10/20  6:35 AM  Result Value Ref Range   WBC 7.2 4.0 - 10.5 K/uL   RBC 2.74 (L) 4.22 - 5.81 MIL/uL   Hemoglobin 8.0 (L) 13.0 - 17.0 g/dL   HCT 27.4 (L) 39.0 - 52.0 %   MCV 100.0 80.0 - 100.0 fL   MCH 29.2 26.0 - 34.0 pg   MCHC 29.2 (L) 30.0 - 36.0 g/dL   RDW 15.2 11.5 - 15.5 %   Platelets 247 150 - 400 K/uL   nRBC 0.0 0.0 - 0.2 %   Neutrophils Relative % 59 %   Neutro Abs 4.3 1.7 - 7.7 K/uL   Lymphocytes Relative 30 %   Lymphs Abs 2.2 0.7 - 4.0 K/uL   Monocytes Relative 6 %   Monocytes Absolute 0.5 0.1 - 1.0 K/uL   Eosinophils Relative 4 %   Eosinophils Absolute 0.3 0.0 - 0.5 K/uL   Basophils Relative 1 %   Basophils Absolute 0.0 0.0 - 0.1 K/uL   Immature Granulocytes 0 %   Abs Immature Granulocytes 0.03 0.00 - 0.07 K/uL    Comment: Performed at [facility], [address], [place], [place] [address]  CBC with Differential/Platelet     Status: Abnormal   Collection Time: 09/11/20  5:13 AM  Result Value Ref Range   WBC 7.3 4.0 - 10.5 K/uL   RBC 2.80 (L) 4.22 - 5.81 MIL/uL   Hemoglobin 8.3 (L) 13.0 - 17.0 g/dL   HCT 27.3 (L) 39.0 - 52.0 %  MCV 97.5 80.0 - 100.0 fL   MCH 29.6 26.0 - 34.0 pg   MCHC 30.4 30.0 - 36.0 g/dL   RDW 15.2 11.5 - 15.5 %   Platelets 256 150 - 400 K/uL   nRBC 0.0 0.0 - 0.2 %   Neutrophils Relative % 49 %   Neutro Abs 3.7 1.7 - 7.7 K/uL   Lymphocytes Relative 36 %   Lymphs Abs 2.6 0.7 - 4.0 K/uL   Monocytes Relative 9 %   Monocytes Absolute 0.6 0.1 - 1.0 K/uL   Eosinophils Relative 4 %   Eosinophils Absolute 0.3 0.0 - 0.5 K/uL   Basophils Relative 1 %   Basophils Absolute 0.1 0.0 - 0.1 K/uL   Immature Granulocytes 1 %   Abs Immature Granulocytes 0.04 0.00 - 0.07 K/uL    Comment: Performed at [facility], [address], [place], [place] [address]  Renal function panel     Status: Abnormal   Collection Time: 09/11/20  5:13 AM  Result Value Ref Range    Sodium 137 135 - 145 mmol/L   Potassium 3.5 3.5 - 5.1 mmol/L   Chloride 103 98 - 111 mmol/L   CO2 27 22 - 32 mmol/L   Glucose, Bld 94 70 - 99 mg/dL    Comment: Glucose reference range applies only to samples taken after fasting for at least 8 hours.   BUN 26 (H) 8 - 23 mg/dL   Creatinine, Ser 2.35 (H) 0.61 - 1.24 mg/dL   Calcium 8.9 8.9 - 10.3 mg/dL   Phosphorus 3.7 2.5 - 4.6 mg/dL   Albumin 2.7 (L) 3.5 - 5.0 g/dL   GFR, Estimated 28 (L) >60 mL/min    Comment: (NOTE) Calculated using the CKD-EPI Creatinine Equation (2021)    Anion gap 7 5 - 15    Comment: Performed at [facility], [address] [place], [place] [address]      Assessment & Plan:  Jordan Ward is a 79 y.o. male with urosepsis, bacteremia who needs tunneled line. We discussed risk of bleeding, infection, pneumothorax, and injury to vessels.   -Will plan for OR today    All questions were answered to the satisfaction of the patient and family.     Virl Cagey 09/11/2020, 8:52 AM

## 2020-09-11 NOTE — Discharge Instructions (Signed)
DISCHARGE INSTRUCTIONS FOR KIDNEY STONE/URETERAL STENT   MEDICATIONS:  1. Resume all your other meds from home - except do not take any extra narcotic pain meds that you may have at home.   ACTIVITY:  1. No strenuous activity x 1week  2. No driving while on narcotic pain medications  3. Drink plenty of water  4. Continue to walk at home - you can still get blood clots when you are at home, so keep active, but don't over do it.  5. May return to work/school tomorrow or when you feel ready   BATHING:  1. You can shower and we recommend daily showers   SIGNS/SYMPTOMS TO CALL:  Please call us if you have a fever greater than 101.5, uncontrolled nausea/vomiting, uncontrolled pain, dizziness, unable to urinate, bloody urine, chest pain, shortness of breath, leg swelling, leg pain, redness around wound, drainage from wound, or any other concerns or questions.   You can reach [place] at [phone].   FOLLOW-UP:  1. You will be scheduled to f/u with Dr. [name] in the coming weeks for removal of the stone.   Tunneled Central Venous Catheter Flushing Guide  It is important to flush your tunneled central venous catheter each time you use it, both before and after you use it. Flushing your catheter will help prevent it from clogging. What are the risks? Risks may include:  Infection.  Air getting into the catheter and bloodstream. Supplies needed:  A clean pair of gloves.  A disinfecting wipe. Use an alcohol wipe, chlorhexidine wipe, or iodine wipe as told by your health care provider.  A 10 mL syringe that has been prefilled with saline solution.  An empty 10 mL syringe, if a substance called heparin was injected into your catheter. How to flush your catheter When you flush your catheter, make sure you follow any specific instructions from your health care provider or the manufacturer. These are general guidelines. Flushing your catheter before use If there is heparin in your  catheter: 1. Wash your hands with soap and water. 2. Put on gloves. 3. Scrub the injection cap for a minimum of 15 seconds with a disinfecting wipe. 4. Unclamp the catheter. 5. Attach the empty syringe to the injection cap. 6. Pull the syringe plunger back and withdraw 10 mL of blood. 7. Place the syringe into an appropriate waste container. 8. Scrub the injection cap for 15 seconds with a disinfecting wipe. 9. Attach the prefilled syringe to the injection cap. 10. Flush the catheter by pushing the plunger forward until all the liquid from the syringe is in the catheter. 11. Remove the syringe from the injection cap. 12. Clamp the catheter. If there is no heparin in your catheter: 1. Wash your hands with soap and water. 2. Put on gloves. 3. Scrub the injection cap for 15 seconds with a disinfecting wipe. 4. Unclamp the catheter. 5. Attach the prefilled syringe to the injection cap. 6. Flush the catheter by pushing the plunger forward until 5 mL of the liquid from the syringe is in the catheter. 7. Pull back on the syringe until you see blood in the catheter. 8. If you have been asked to collect any blood, follow your health care provider's instructions. Otherwise, flush the catheter with the rest of the solution from the syringe. 9. Remove the syringe from the injection cap. 10. Clamp the catheter.  Flushing your catheter after use 1. Wash your hands with soap and water. 2. Put on gloves. 3. Scrub  the injection cap for 15 seconds with a disinfecting wipe. 4. Unclamp the catheter. 5. Attach the prefilled syringe to the injection cap. 6. Flush the catheter by pushing the plunger forward until all of the liquid from the syringe is in the catheter. 7. Remove the syringe from the injection cap. 8. Clamp the catheter. Problems and solutions  If blood cannot be completely cleared from the injection cap, you may need to have the injection cap replaced.  If the catheter is difficult to  flush, use the pulsing method. The pulsing method involves pushing only a few milliliters of solution into the catheter at a time and pausing between pushes.  If you do not see blood in the catheter when you pull back on the syringe, change your body position, such as by raising your arms above your head. Take a deep breath and cough. Then, pull back on the syringe. If you still do not see blood, flush the catheter with a small amount of solution. Then, change positions again and take a breath or cough. Pull back on the syringe again. If you still do not see blood, finish flushing the catheter and contact your health care provider. Do not use your catheter until your health care provider says it is okay. General tips  Have someone help you flush your catheter, if possible.  Do not force fluid through your catheter.  Do not use a syringe that is larger or smaller than 10 mL. Using a smaller syringe can make the catheter burst.  Do not use your catheter without flushing it first if it has heparin in it. Contact a health care provider if:  You cannot see any blood in the catheter when you flush it before using it.  Your catheter is difficult to flush. Get help right away if:  You cannot flush the catheter.  The catheter leaks when you flush it or when there is fluid in it.  There are cracks or breaks in the catheter. Summary  It is important to flush your tunneled central venous catheter each time you use it, both before and after you use it.  Scrub the injection cap for 15 seconds with a disinfecting wipe before and after you flush it.  When you flush your catheter, make sure you follow any specific instructions from your health care provider or the manufacturer.  Get help right away if you cannot flush the catheter. This information is not intended to replace advice given to you by your health care provider. Make sure you discuss any questions you have with your health care  provider. Document Revised: 06/10/2019 Document Reviewed: 12/01/2018 Elsevier Patient Education  [place], Adult A central line is a thin, flexible tube (catheter) that is put in your vein. It can be used to:  Give you medicine.  Give you food and nutrients. Follow these instructions at home: Caring for the tube   Follow instructions from your doctor about: ? Flushing the tube with saline solution. ? Cleaning the tube and the area around it.  Only flush with clean (sterile) supplies. The supplies should be from your doctor, a pharmacy, or another place that your doctor recommends.  Before you flush the tube or clean the area around the tube: ? Wash your hands with soap and water. If you cannot use soap and water, use hand sanitizer. ? Clean the central line hub with rubbing alcohol. Caring for your skin  Keep the area where the tube  was put in clean and dry.  Every day, and when changing the bandage, check the skin around the central line for: ? Redness, swelling, or pain. ? Fluid or blood. ? Warmth. ? Pus. ? A bad smell. General instructions  Keep the tube clamped, unless it is being used.  Keep your supplies in a clean, dry location.  If you or someone else accidentally pulls on the tube, make sure: ? The bandage (dressing) is okay. ? There is no bleeding. ? The tube has not been pulled out.  Do not use scissors or sharp objects near the tube.  Do not swim or let the tube soak in a tub.  Ask your doctor what activities are safe for you. Your doctor may tell you not to lift anything or move your arm too much.  Take over-the-counter and prescription medicines only as told by your doctor.  Change bandages as told by your doctor.  Keep your bandage dry. If a bandage gets wet, have it changed right away.  Keep all follow-up visits as told by your doctor. This is important. Throwing away supplies  Throw away any syringes in a trash  (disposal) container that is only for sharp items (sharps container). You can buy a sharps container from a pharmacy, or you can make one by using an empty hard plastic bottle with a cover.  Place any used bandages or infusion bags into a plastic bag. Throw that bag in the trash. Contact a doctor if:  You have any of these where the tube was put in: ? Redness, swelling, or pain. ? Fluid or blood. ? A warm feeling. ? Pus or a bad smell. Get help right away if:  You have: ? A fever. ? Chills. ? Trouble getting enough air (shortness of breath). ? Trouble breathing. ? Pain in your chest. ? Swelling in your neck, face, chest, or arm.  You are coughing.  You feel your heart beating fast or skipping beats.  You feel dizzy or you pass out (faint).  There are red lines coming from where the tube was put in.  The area where the tube was put in is bleeding and the bleeding will not stop.  Your tube is hard to flush.  You do not get a blood return from the tube.  The tube gets loose or comes out.  The tube has a hole or a tear.  The tube leaks. Summary  A central line is a thin, flexible tube (catheter) that is put in your vein. It can be used to take blood for lab tests or to give you medicine.  Follow instructions from your doctor about flushing and cleaning the tube.  Keep the area where the tube was put in clean and dry.  Ask your doctor what activities are safe for you. This information is not intended to replace advice given to you by your health care provider. Make sure you discuss any questions you have with your health care provider. Document Revised: 01/05/2019 Document Reviewed: 10/02/2016 Elsevier Patient Education  [place].

## 2020-09-11 NOTE — Progress Notes (Signed)
Patient ID: MACLAIN COHRON, male   DOB: 1941-03-02, 79 y.o.   MRN: [id number] S: Feeling a little better today. O:BP (!) 162/84 (BP Location: Left Arm)   Pulse 70   Temp 98.4 F (36.9 C)   Resp 16   Ht 5\' 3"  (1.6 m)   Wt 51.4 kg   SpO2 100%   BMI 20.07 kg/m   Intake/Output Summary (Last 24 hours) at 09/11/2020 0857 Last data filed at 09/11/2020 0300 Gross per 24 hour  Intake 976.95 ml  Output 2000 ml  Net -1023.05 ml   Intake/Output: I/O last 3 completed shifts: In: 119 [P.O.:240; I.V.:487; IV Piggyback:250] Out: 3000 [Urine:3000]  Intake/Output this shift:  No intake/output data recorded. Weight change:  Gen: NAD CVS: RRR Resp: cta Abd: +BS, soft, NT/ND Ext: no edema  Recent Labs  Lab 09/05/20 0204 09/05/20 0534 09/05/20 0812 09/06/20 0429 09/07/20 0435 09/08/20 0431 09/09/20 0512 09/11/20 0513  NA 134* 135  --  140 138 137 143 137  K 2.7* 3.3*  --  3.4* 3.3* 3.3* 4.2 3.5  CL 98 100  --  105 102 101 108 103  CO2 25 24  --  25 26 26 28 27   GLUCOSE 144* 154*  --  89 97 92 107* 94  BUN 39* 40*  --  42* 43* 35* 34* 26*  CREATININE 3.39* 3.54* 3.67* 3.69* 3.70* 3.06* 3.05* 2.35*  ALBUMIN  --   --   --   --   --   --   --  2.7*  CALCIUM 9.2 8.5*  --  8.4* 7.9* 8.1* 8.4* 8.9  PHOS  --   --   --   --  4.5  --   --  3.7   Liver Function Tests: Recent Labs  Lab 09/11/20 0513  ALBUMIN 2.7*   No results for input(s): LIPASE, AMYLASE in the last 168 hours. No results for input(s): AMMONIA in the last 168 hours. CBC: Recent Labs  Lab 09/07/20 0435 09/08/20 0431 09/09/20 0512 09/10/20 0635 09/11/20 0513  WBC 11.2* 7.3 6.4 7.2 7.3  NEUTROABS 8.3* 4.4 3.5 4.3 3.7  HGB 7.2* 8.1* 8.2* 8.0* 8.3*  HCT 23.8* 26.7* 28.0* 27.4* 27.3*  MCV 98.8 96.7 100.4* 100.0 97.5  PLT 203 223 230 247 256   Cardiac Enzymes: No results for input(s): CKTOTAL, CKMB, CKMBINDEX, TROPONINI in the last 168 hours. CBG: No results for input(s): GLUCAP in the last 168 hours.  Iron  Studies: No results for input(s): IRON, TIBC, TRANSFERRIN, FERRITIN in the last 72 hours. Studies/Results: No results found. Marland Kitchen amoxicillin  500 mg Oral Q12H  . ascorbic acid  500 mg Oral BID  . darbepoetin (ARANESP) injection - NON-DIALYSIS  40 mcg Subcutaneous Q Mon-1800  . heparin  5,000 Units Subcutaneous Q8H  . HYDROcodone-acetaminophen  1 tablet Oral BID  . levothyroxine  75 mcg Oral QAC breakfast  .  morphine injection  4 mg Intravenous Once  . multivitamin with minerals  1 tablet Oral Daily    BMET    Component Value Date/Time   NA 137 09/11/2020 0513   NA 136 (A) 09/27/2018 0000   K 3.5 09/11/2020 0513   CL 103 09/11/2020 0513   CO2 27 09/11/2020 0513   GLUCOSE 94 09/11/2020 0513   BUN 26 (H) 09/11/2020 0513   BUN 28 (A) 09/27/2018 0000   CREATININE 2.35 (H) 09/11/2020 0513   CALCIUM 8.9 09/11/2020 0513   GFRNONAA 28 (L) 09/11/2020 1478  GFRAA 43 (L) 05/07/2020 0521   CBC    Component Value Date/Time   WBC 7.3 09/11/2020 0513   RBC 2.80 (L) 09/11/2020 0513   HGB 8.3 (L) 09/11/2020 0513   HCT 27.3 (L) 09/11/2020 0513   PLT 256 09/11/2020 0513   MCV 97.5 09/11/2020 0513   MCH 29.6 09/11/2020 0513   MCHC 30.4 09/11/2020 0513   RDW 15.2 09/11/2020 0513   LYMPHSABS 2.6 09/11/2020 0513   MONOABS 0.6 09/11/2020 0513   EOSABS 0.3 09/11/2020 0513   BASOSABS 0.1 09/11/2020 0513    Assessment/Plan:  1. AKI/CKD stage 3- due to solitary functioning left kidney complicated by obstructing left-sided ureteral stone s/p cystoscopy with stent placement.  Scr peaked at 3.7 and now slowly improving to 2.35.  Unclear if this is new baseline following recurrent obstructive uropathy.  No indication for dialysis at this time.  Excellent UOP.  Nothing further to add and will sign off for now.  Will arrange for outpatient follow up in our [place] office 3-4 weeks after discharge.  2. Obstructing left ureteral stone in solitary functioning kidney.  S/p cystoscopy and ureteral  stent placement by Urology.  Follow up with urology.  3. Polymicrobial urosepsis- Enterococcal faecalis and pseudomonas.  Currently on zosyn per primary svc. 4. Anemia- related to acute illness +/- CKD.  Check iron stores and will start ESA. 5. Neurogenic bladder- s/p foley catheter f/u with Dr. [name] 6. Multiple sclerosis 7. Recent Covid-19 infection 8. Chronic diastolic CHF- stable 9. CAD 10. P. Afib  Donetta Potts, MD [employer] [phone]

## 2020-09-11 NOTE — Care Management Important Message (Signed)
Important Message  Patient Details  Name: Jordan Ward MRN: [id number] Date of Birth: 10-15-1940   Medicare Important Message Given:  Yes     [name] 09/11/2020, 4:06 PM

## 2020-09-11 NOTE — Anesthesia Postprocedure Evaluation (Signed)
Anesthesia Post Note  Patient: Jordan Ward  Procedure(s) Performed: PLACEMENT OF TUNNELED CENTRAL LINE INTO JUGULAR VEIN (Right Chest)  Patient location during evaluation: PACU Anesthesia Type: MAC Level of consciousness: awake and oriented Pain management: pain level controlled Vital Signs Assessment: post-procedure vital signs reviewed and stable Respiratory status: spontaneous breathing, respiratory function stable and patient connected to nasal cannula oxygen Cardiovascular status: blood pressure returned to baseline and stable Postop Assessment: no apparent nausea or vomiting Anesthetic complications: no   No complications documented.   Last Vitals:  Vitals:   09/11/20 0537 09/11/20 1224  BP: (!) 162/84 (!) 176/89  Pulse: 70   Resp: 16 16  Temp:  36.8 C  SpO2: 100% 100%    Last Pain:  Vitals:   09/11/20 1224  TempSrc: Oral  PainSc: 0-No pain                 Karna Dupes

## 2020-09-11 NOTE — Transfer of Care (Signed)
Immediate Anesthesia Transfer of Care Note  Patient: Jordan Ward  Procedure(s) Performed: PLACEMENT OF TUNNELED CENTRAL LINE INTO JUGULAR VEIN (Right Chest)  Patient Location: PACU  Anesthesia Type:MAC  Level of Consciousness: awake, alert  and oriented  Airway & Oxygen Therapy: Patient Spontanous Breathing and Patient connected to nasal cannula oxygen  Post-op Assessment: Report given to RN and Post -op Vital signs reviewed and stable  Post vital signs: Reviewed and stable  Last Vitals:  Vitals Value Taken Time  BP    Temp    Pulse    Resp    SpO2      Last Pain:  Vitals:   09/11/20 1224  TempSrc: Oral  PainSc: 0-No pain      Patients Stated Pain Goal: 8 (38/88/75 7972)  Complications: No complications documented.

## 2020-09-12 DIAGNOSIS — Z452 Encounter for adjustment and management of vascular access device: Secondary | ICD-10-CM | POA: Diagnosis not present

## 2020-09-12 DIAGNOSIS — N184 Chronic kidney disease, stage 4 (severe): Secondary | ICD-10-CM | POA: Diagnosis not present

## 2020-09-12 DIAGNOSIS — L89152 Pressure ulcer of sacral region, stage 2: Secondary | ICD-10-CM | POA: Diagnosis not present

## 2020-09-12 DIAGNOSIS — D631 Anemia in chronic kidney disease: Secondary | ICD-10-CM | POA: Diagnosis not present

## 2020-09-12 DIAGNOSIS — Z435 Encounter for attention to cystostomy: Secondary | ICD-10-CM | POA: Diagnosis not present

## 2020-09-12 DIAGNOSIS — I69354 Hemiplegia and hemiparesis following cerebral infarction affecting left non-dominant side: Secondary | ICD-10-CM | POA: Diagnosis not present

## 2020-09-12 DIAGNOSIS — I2510 Atherosclerotic heart disease of native coronary artery without angina pectoris: Secondary | ICD-10-CM | POA: Diagnosis not present

## 2020-09-12 DIAGNOSIS — A4181 Sepsis due to Enterococcus: Secondary | ICD-10-CM | POA: Diagnosis not present

## 2020-09-12 DIAGNOSIS — N1832 Chronic kidney disease, stage 3b: Secondary | ICD-10-CM | POA: Diagnosis not present

## 2020-09-12 DIAGNOSIS — I130 Hypertensive heart and chronic kidney disease with heart failure and stage 1 through stage 4 chronic kidney disease, or unspecified chronic kidney disease: Secondary | ICD-10-CM | POA: Diagnosis not present

## 2020-09-12 DIAGNOSIS — I471 Supraventricular tachycardia: Secondary | ICD-10-CM | POA: Diagnosis not present

## 2020-09-12 DIAGNOSIS — U071 COVID-19: Secondary | ICD-10-CM | POA: Diagnosis not present

## 2020-09-12 DIAGNOSIS — G35 Multiple sclerosis: Secondary | ICD-10-CM | POA: Diagnosis not present

## 2020-09-12 DIAGNOSIS — B964 Proteus (mirabilis) (morganii) as the cause of diseases classified elsewhere: Secondary | ICD-10-CM | POA: Diagnosis not present

## 2020-09-12 DIAGNOSIS — A4152 Sepsis due to Pseudomonas: Secondary | ICD-10-CM | POA: Diagnosis not present

## 2020-09-12 DIAGNOSIS — J1282 Pneumonia due to coronavirus disease 2019: Secondary | ICD-10-CM | POA: Diagnosis not present

## 2020-09-12 DIAGNOSIS — R739 Hyperglycemia, unspecified: Secondary | ICD-10-CM | POA: Diagnosis not present

## 2020-09-12 DIAGNOSIS — G8929 Other chronic pain: Secondary | ICD-10-CM | POA: Diagnosis not present

## 2020-09-12 DIAGNOSIS — Z466 Encounter for fitting and adjustment of urinary device: Secondary | ICD-10-CM | POA: Diagnosis not present

## 2020-09-12 DIAGNOSIS — R1310 Dysphagia, unspecified: Secondary | ICD-10-CM | POA: Diagnosis not present

## 2020-09-12 DIAGNOSIS — G4733 Obstructive sleep apnea (adult) (pediatric): Secondary | ICD-10-CM | POA: Diagnosis not present

## 2020-09-12 DIAGNOSIS — G9349 Other encephalopathy: Secondary | ICD-10-CM | POA: Diagnosis not present

## 2020-09-12 DIAGNOSIS — J9601 Acute respiratory failure with hypoxia: Secondary | ICD-10-CM | POA: Diagnosis not present

## 2020-09-12 DIAGNOSIS — I480 Paroxysmal atrial fibrillation: Secondary | ICD-10-CM | POA: Diagnosis not present

## 2020-09-12 DIAGNOSIS — Q603 Renal hypoplasia, unilateral: Secondary | ICD-10-CM | POA: Diagnosis not present

## 2020-09-12 DIAGNOSIS — I959 Hypotension, unspecified: Secondary | ICD-10-CM | POA: Diagnosis not present

## 2020-09-12 DIAGNOSIS — M1990 Unspecified osteoarthritis, unspecified site: Secondary | ICD-10-CM | POA: Diagnosis not present

## 2020-09-12 DIAGNOSIS — N136 Pyonephrosis: Secondary | ICD-10-CM | POA: Diagnosis not present

## 2020-09-12 DIAGNOSIS — J8410 Pulmonary fibrosis, unspecified: Secondary | ICD-10-CM | POA: Diagnosis not present

## 2020-09-12 DIAGNOSIS — A4159 Other Gram-negative sepsis: Secondary | ICD-10-CM | POA: Diagnosis not present

## 2020-09-12 DIAGNOSIS — M549 Dorsalgia, unspecified: Secondary | ICD-10-CM | POA: Diagnosis not present

## 2020-09-12 DIAGNOSIS — G4700 Insomnia, unspecified: Secondary | ICD-10-CM | POA: Diagnosis not present

## 2020-09-12 DIAGNOSIS — N179 Acute kidney failure, unspecified: Secondary | ICD-10-CM | POA: Diagnosis not present

## 2020-09-12 DIAGNOSIS — I5032 Chronic diastolic (congestive) heart failure: Secondary | ICD-10-CM | POA: Diagnosis not present

## 2020-09-12 NOTE — Addendum Note (Signed)
Addendum  created 09/12/20 0719 by Karna Dupes, CRNA   Charge Capture section accepted, Visit diagnoses modified

## 2020-09-13 DIAGNOSIS — A4159 Other Gram-negative sepsis: Secondary | ICD-10-CM | POA: Diagnosis not present

## 2020-09-13 DIAGNOSIS — A4152 Sepsis due to Pseudomonas: Secondary | ICD-10-CM | POA: Diagnosis not present

## 2020-09-13 DIAGNOSIS — L89152 Pressure ulcer of sacral region, stage 2: Secondary | ICD-10-CM | POA: Diagnosis not present

## 2020-09-13 DIAGNOSIS — Z466 Encounter for fitting and adjustment of urinary device: Secondary | ICD-10-CM | POA: Diagnosis not present

## 2020-09-13 DIAGNOSIS — N136 Pyonephrosis: Secondary | ICD-10-CM | POA: Diagnosis not present

## 2020-09-13 DIAGNOSIS — A4181 Sepsis due to Enterococcus: Secondary | ICD-10-CM | POA: Diagnosis not present

## 2020-09-13 LAB — CULTURE, BLOOD (ROUTINE X 2)
Culture: NO GROWTH
Culture: NO GROWTH

## 2020-09-14 ENCOUNTER — Inpatient Hospital Stay: Payer: Medicare Other | Admitting: Nurse Practitioner

## 2020-09-14 DIAGNOSIS — A4181 Sepsis due to Enterococcus: Secondary | ICD-10-CM | POA: Diagnosis not present

## 2020-09-14 DIAGNOSIS — A4152 Sepsis due to Pseudomonas: Secondary | ICD-10-CM | POA: Diagnosis not present

## 2020-09-14 DIAGNOSIS — A4159 Other Gram-negative sepsis: Secondary | ICD-10-CM | POA: Diagnosis not present

## 2020-09-14 DIAGNOSIS — L89152 Pressure ulcer of sacral region, stage 2: Secondary | ICD-10-CM | POA: Diagnosis not present

## 2020-09-14 DIAGNOSIS — N136 Pyonephrosis: Secondary | ICD-10-CM | POA: Diagnosis not present

## 2020-09-14 DIAGNOSIS — Z452 Encounter for adjustment and management of vascular access device: Secondary | ICD-10-CM

## 2020-09-14 DIAGNOSIS — Z466 Encounter for fitting and adjustment of urinary device: Secondary | ICD-10-CM | POA: Diagnosis not present

## 2020-09-17 ENCOUNTER — Encounter (HOSPITAL_COMMUNITY): Payer: Self-pay | Admitting: General Surgery

## 2020-09-17 DIAGNOSIS — Z466 Encounter for fitting and adjustment of urinary device: Secondary | ICD-10-CM | POA: Diagnosis not present

## 2020-09-17 DIAGNOSIS — A4152 Sepsis due to Pseudomonas: Secondary | ICD-10-CM | POA: Diagnosis not present

## 2020-09-17 DIAGNOSIS — A4181 Sepsis due to Enterococcus: Secondary | ICD-10-CM | POA: Diagnosis not present

## 2020-09-17 DIAGNOSIS — A4159 Other Gram-negative sepsis: Secondary | ICD-10-CM | POA: Diagnosis not present

## 2020-09-17 DIAGNOSIS — N136 Pyonephrosis: Secondary | ICD-10-CM | POA: Diagnosis not present

## 2020-09-17 DIAGNOSIS — A419 Sepsis, unspecified organism: Secondary | ICD-10-CM | POA: Diagnosis not present

## 2020-09-17 DIAGNOSIS — L89152 Pressure ulcer of sacral region, stage 2: Secondary | ICD-10-CM | POA: Diagnosis not present

## 2020-09-18 DIAGNOSIS — Z466 Encounter for fitting and adjustment of urinary device: Secondary | ICD-10-CM | POA: Diagnosis not present

## 2020-09-18 DIAGNOSIS — A4152 Sepsis due to Pseudomonas: Secondary | ICD-10-CM | POA: Diagnosis not present

## 2020-09-18 DIAGNOSIS — A4181 Sepsis due to Enterococcus: Secondary | ICD-10-CM | POA: Diagnosis not present

## 2020-09-18 DIAGNOSIS — A4159 Other Gram-negative sepsis: Secondary | ICD-10-CM | POA: Diagnosis not present

## 2020-09-18 DIAGNOSIS — L89152 Pressure ulcer of sacral region, stage 2: Secondary | ICD-10-CM | POA: Diagnosis not present

## 2020-09-18 DIAGNOSIS — N136 Pyonephrosis: Secondary | ICD-10-CM | POA: Diagnosis not present

## 2020-09-19 DIAGNOSIS — A4152 Sepsis due to Pseudomonas: Secondary | ICD-10-CM | POA: Diagnosis not present

## 2020-09-19 DIAGNOSIS — L89152 Pressure ulcer of sacral region, stage 2: Secondary | ICD-10-CM | POA: Diagnosis not present

## 2020-09-19 DIAGNOSIS — A4181 Sepsis due to Enterococcus: Secondary | ICD-10-CM | POA: Diagnosis not present

## 2020-09-19 DIAGNOSIS — A4159 Other Gram-negative sepsis: Secondary | ICD-10-CM | POA: Diagnosis not present

## 2020-09-19 DIAGNOSIS — N136 Pyonephrosis: Secondary | ICD-10-CM | POA: Diagnosis not present

## 2020-09-19 DIAGNOSIS — Z466 Encounter for fitting and adjustment of urinary device: Secondary | ICD-10-CM | POA: Diagnosis not present

## 2020-09-20 ENCOUNTER — Encounter (HOSPITAL_COMMUNITY)
Admission: RE | Disposition: A | Discharge status: Home / Self Care | Payer: Self-pay | Source: Home / Self Care | Attending: General Surgery

## 2020-09-20 ENCOUNTER — Ambulatory Visit (HOSPITAL_COMMUNITY)
Admission: RE | Admit: 2020-09-20 | Discharge: 2020-09-20 | Disposition: A | Discharge status: Home / Self Care | Payer: Medicare Other | Attending: General Surgery | Admitting: General Surgery

## 2020-09-20 ENCOUNTER — Ambulatory Visit (HOSPITAL_COMMUNITY): Payer: Medicare Other

## 2020-09-20 DIAGNOSIS — Z8673 Personal history of transient ischemic attack (TIA), and cerebral infarction without residual deficits: Secondary | ICD-10-CM | POA: Diagnosis not present

## 2020-09-20 DIAGNOSIS — Z881 Allergy status to other antibiotic agents status: Secondary | ICD-10-CM | POA: Insufficient documentation

## 2020-09-20 DIAGNOSIS — Z79890 Hormone replacement therapy (postmenopausal): Secondary | ICD-10-CM | POA: Diagnosis not present

## 2020-09-20 DIAGNOSIS — Z79899 Other long term (current) drug therapy: Secondary | ICD-10-CM | POA: Diagnosis not present

## 2020-09-20 DIAGNOSIS — Z4901 Encounter for fitting and adjustment of extracorporeal dialysis catheter: Secondary | ICD-10-CM | POA: Insufficient documentation

## 2020-09-20 DIAGNOSIS — N136 Pyonephrosis: Secondary | ICD-10-CM | POA: Diagnosis not present

## 2020-09-20 DIAGNOSIS — Z7984 Long term (current) use of oral hypoglycemic drugs: Secondary | ICD-10-CM | POA: Diagnosis not present

## 2020-09-20 DIAGNOSIS — Z9049 Acquired absence of other specified parts of digestive tract: Secondary | ICD-10-CM | POA: Insufficient documentation

## 2020-09-20 DIAGNOSIS — Z466 Encounter for fitting and adjustment of urinary device: Secondary | ICD-10-CM | POA: Diagnosis not present

## 2020-09-20 DIAGNOSIS — Z452 Encounter for adjustment and management of vascular access device: Secondary | ICD-10-CM

## 2020-09-20 DIAGNOSIS — J189 Pneumonia, unspecified organism: Secondary | ICD-10-CM | POA: Diagnosis not present

## 2020-09-20 DIAGNOSIS — A4159 Other Gram-negative sepsis: Secondary | ICD-10-CM | POA: Diagnosis not present

## 2020-09-20 DIAGNOSIS — A4181 Sepsis due to Enterococcus: Secondary | ICD-10-CM | POA: Diagnosis not present

## 2020-09-20 DIAGNOSIS — L89152 Pressure ulcer of sacral region, stage 2: Secondary | ICD-10-CM | POA: Diagnosis not present

## 2020-09-20 DIAGNOSIS — Z87891 Personal history of nicotine dependence: Secondary | ICD-10-CM | POA: Insufficient documentation

## 2020-09-20 DIAGNOSIS — A4152 Sepsis due to Pseudomonas: Secondary | ICD-10-CM | POA: Diagnosis not present

## 2020-09-20 HISTORY — PX: PORT-A-CATH REMOVAL: SHX5289

## 2020-09-20 SURGERY — MINOR REMOVAL PORT-A-CATH
Anesthesia: LOCAL | Laterality: Right

## 2020-09-20 MED ORDER — LIDOCAINE HCL (PF) 1 % IJ SOLN
INTRAMUSCULAR | Status: AC
  Filled 2020-09-20: qty 30

## 2020-09-20 MED ORDER — LIDOCAINE HCL (PF) 1 % IJ SOLN
INTRAMUSCULAR | Status: DC | PRN
  Administered 2020-09-20: 2 mL

## 2020-09-20 MED ORDER — CHLORHEXIDINE GLUCONATE CLOTH 2 % EX PADS: 6.0000 | MEDICATED_PAD | Freq: Once | CUTANEOUS | Status: DC

## 2020-09-20 SURGICAL SUPPLY — 19 items
ADH SKN CLS APL DERMABOND .7 (GAUZE/BANDAGES/DRESSINGS) ×1
APL PRP STRL LF ISPRP CHG 10.5 (MISCELLANEOUS) ×1
APPLICATOR CHLORAPREP 10.5 ORG (MISCELLANEOUS) ×2 IMPLANT
CLOTH BEACON ORANGE TIMEOUT ST (SAFETY) ×2 IMPLANT
COVER WAND RF STERILE (DRAPES) ×2 IMPLANT
DECANTER SPIKE VIAL GLASS SM (MISCELLANEOUS) ×2 IMPLANT
DERMABOND ADVANCED (GAUZE/BANDAGES/DRESSINGS) ×1
DERMABOND ADVANCED .7 DNX12 (GAUZE/BANDAGES/DRESSINGS) ×1 IMPLANT
DRAPE HALF SHEET 40X57 (DRAPES) ×2 IMPLANT
GLOVE BIO SURGEON STRL SZ 6.5 (GLOVE) ×2 IMPLANT
GLOVE BIOGEL PI IND STRL 6.5 (GLOVE) ×1 IMPLANT
GLOVE BIOGEL PI IND STRL 7.0 (GLOVE) ×1 IMPLANT
GLOVE BIOGEL PI INDICATOR 6.5 (GLOVE) ×1
GLOVE BIOGEL PI INDICATOR 7.0 (GLOVE) ×1
GOWN STRL REUS W/TWL LRG LVL3 (GOWN DISPOSABLE) ×2 IMPLANT
SPONGE GAUZE 2X2 8PLY STRL LF (GAUZE/BANDAGES/DRESSINGS) ×2 IMPLANT
SUT MNCRL AB 4-0 PS2 18 (SUTURE) ×2 IMPLANT
SUT VIC AB 3-0 SH 27 (SUTURE) ×2
SUT VIC AB 3-0 SH 27X BRD (SUTURE) ×1 IMPLANT

## 2020-09-20 NOTE — Interval H&P Note (Signed)
History and Physical Interval Note:  09/20/2020 9:56 AM  Jordan Ward  has presented today for surgery, with the diagnosis of central line in place.  The various methods of treatment have been discussed with the patient and family. After consideration of risks, benefits and other options for treatment, the patient has consented to  Procedure(s) with comments: MINOR REMOVAL CENTRAL LINE (Right) - Pt to arrive at 7:30am for procedure as a surgical intervention.  The patient's history has been reviewed, patient examined, no change in status, stable for surgery.  I have reviewed the patient's chart and labs.  Questions were answered to the patient's satisfaction.    History was updated and discussed removal prior to removal. Note with late entry.  Virl Cagey

## 2020-09-20 NOTE — Discharge Instructions (Signed)
Keep bandage in place for 48 hours. It may drain. Can shower with bandage. Remove at 48 hours and replace if needed. Tylenol for any pain control.   Central line  Removal, Adult, Care After This sheet gives you information about how to care for yourself after your procedure. Your health care provider may also give you more specific instructions. If you have problems or questions, contact your health care provider. What can I expect after the procedure? After your procedure, it is common to have:  Tenderness or soreness.  Redness, swelling, or a scab where the central line was removed (exit site). Follow these instructions at home:  Watch closely for any signs of an air bubble in the vein (air embolism). This is a rare but serious complication. If you have signs of air embolism, call 911 immediately and lie down on your left side to keep the air from moving into the lungs. Signs of an air embolism include: ? Difficulty breathing. ? Chest pain. ? Coughing or wheezing. ? Skin that is pale, blue, cold, or clammy. ? Rapid pulse. ? Rapid breathing. ? Fainting. After 24 Hours have passed:  Remove your dressing as told by your health care provider. Make sure you wash your hands with soap and water before and after you change the dressing. If soap and water are not available, use hand sanitizer.  Return to your normal activities as told by your health care provider.  A small scab may develop over the exit site. Do not pick at the scab.  When bathing or showering, gently wash the exit site with soap and water. Pat it dry.  Watch for signs of infection, such as: ? Fever or chills.. ? More redness, swelling, or soreness  ? Blood, fluid, or pus coming from the exit site. ? Warmth or a bad smell at the exit site. ? A red streak spreading away from the exit site. General instructions  Take over-the-counter and prescription medicines only as told by your health care provider. Do not take any  new medicines without checking with your health care provider first.  If you were prescribed an antibiotic medicine, apply or take it as told by your health care provider. Do not stop using the antibiotic even if your condition improves.  Keep all follow-up visits as told by your health care provider. This is important. Contact a health care provider if:  You have a fever or chills.  You have soreness, redness, or swelling on your exit site, and it gets worse.  You have swollen glands under your arm.  You have any of the following symptoms at your exit site: ? Blood, fluid, or pus. ? Unusual warmth. ? A bad smell. ? A red streak spreading away from the exit site. Get help right away if:  You have signs of an air embolism, such as: ? Difficulty breathing. ? Chest pain. ? Coughing or wheezing. ? Skin that is pale, blue, cold, or clammy. ? Rapid pulse. ? Rapid breathing. ? Fainting. These symptoms may represent a serious problem that is an emergency. Do not wait to see if the symptoms will go away. Get medical help right away. Call your local emergency services (911 in the [place] Do not drive yourself to the hospital. Summary  After your procedure, it is common to have tenderness or soreness, redness, swelling, or a scab at the exit site.  Keep the dressing over the exit site clean and dry. Do not remove the dressing until your  health care provider tells you to do so.  .  Watch closely for any signs of an air embolism. If you have signs of air embolism, call 911 immediately and lie down on your left side. This information is not intended to replace advice given to you by your health care provider. Make sure you discuss any questions you have with your health care provider. Document Revised: 08/28/2017 Document Reviewed: 11/11/2016 Elsevier Patient Education  2020 [employer].

## 2020-09-20 NOTE — Op Note (Signed)
[facility] Procedure Note  09/20/20  Preoperative Diagnosis: Tunneled central line in place    Postoperative Diagnosis: Same   Procedure(s) Performed: Removal of tunneled central line    Surgeon: Lanell Matar. Constance Haw, MD  Anesthesia: 1% lidocaine 10cc    Specimens: None    Estimated Blood Loss: Minimal   Complications: None  Procedure: The patient was taken to the procedure room and placed upright. The chest and neck on the right were prepped and draped. The sutures were cut from the tunneled line. Lidocaine 1% as injected into the tunnel. Using a hemostat the tunnel was opened at the entry site on the chest and the catheter was freed from the tissue. The catheter was removed in its entirety. An assistant held pressure on the internal jugular on the right for 10 minutes. A CXR confirmed complete removal and no pneumothorax.  A gauze and tegaderm were placed over the entry site on the chest.   The patient tolerated the procedure well. Follow up PRN. Tylenol for pain. Remove bandage in 48 hours.    Curlene Labrum, MD [facility] [address] [place], [place] [address] [phone] (office)

## 2020-09-24 DIAGNOSIS — A4181 Sepsis due to Enterococcus: Secondary | ICD-10-CM | POA: Diagnosis not present

## 2020-09-24 DIAGNOSIS — A4159 Other Gram-negative sepsis: Secondary | ICD-10-CM | POA: Diagnosis not present

## 2020-09-24 DIAGNOSIS — A4152 Sepsis due to Pseudomonas: Secondary | ICD-10-CM | POA: Diagnosis not present

## 2020-09-24 DIAGNOSIS — L89152 Pressure ulcer of sacral region, stage 2: Secondary | ICD-10-CM | POA: Diagnosis not present

## 2020-09-24 DIAGNOSIS — N136 Pyonephrosis: Secondary | ICD-10-CM | POA: Diagnosis not present

## 2020-09-24 DIAGNOSIS — Z466 Encounter for fitting and adjustment of urinary device: Secondary | ICD-10-CM | POA: Diagnosis not present

## 2020-09-25 ENCOUNTER — Encounter (HOSPITAL_COMMUNITY): Payer: Self-pay | Admitting: General Surgery

## 2020-09-25 DIAGNOSIS — A4181 Sepsis due to Enterococcus: Secondary | ICD-10-CM | POA: Diagnosis not present

## 2020-09-25 DIAGNOSIS — Z466 Encounter for fitting and adjustment of urinary device: Secondary | ICD-10-CM | POA: Diagnosis not present

## 2020-09-25 DIAGNOSIS — A4152 Sepsis due to Pseudomonas: Secondary | ICD-10-CM | POA: Diagnosis not present

## 2020-09-25 DIAGNOSIS — A4159 Other Gram-negative sepsis: Secondary | ICD-10-CM | POA: Diagnosis not present

## 2020-09-25 DIAGNOSIS — N136 Pyonephrosis: Secondary | ICD-10-CM | POA: Diagnosis not present

## 2020-09-25 DIAGNOSIS — L89152 Pressure ulcer of sacral region, stage 2: Secondary | ICD-10-CM | POA: Diagnosis not present

## 2020-10-01 ENCOUNTER — Ambulatory Visit (HOSPITAL_COMMUNITY): Payer: Medicare Other

## 2020-10-02 ENCOUNTER — Ambulatory Visit: Payer: Medicare Other | Admitting: Urology

## 2020-10-02 DIAGNOSIS — A4181 Sepsis due to Enterococcus: Secondary | ICD-10-CM | POA: Diagnosis not present

## 2020-10-02 DIAGNOSIS — N136 Pyonephrosis: Secondary | ICD-10-CM | POA: Diagnosis not present

## 2020-10-02 DIAGNOSIS — A4152 Sepsis due to Pseudomonas: Secondary | ICD-10-CM | POA: Diagnosis not present

## 2020-10-02 DIAGNOSIS — Z466 Encounter for fitting and adjustment of urinary device: Secondary | ICD-10-CM | POA: Diagnosis not present

## 2020-10-02 DIAGNOSIS — A4159 Other Gram-negative sepsis: Secondary | ICD-10-CM | POA: Diagnosis not present

## 2020-10-02 DIAGNOSIS — L89152 Pressure ulcer of sacral region, stage 2: Secondary | ICD-10-CM | POA: Diagnosis not present

## 2020-10-03 DIAGNOSIS — A4152 Sepsis due to Pseudomonas: Secondary | ICD-10-CM | POA: Diagnosis not present

## 2020-10-03 DIAGNOSIS — L89152 Pressure ulcer of sacral region, stage 2: Secondary | ICD-10-CM | POA: Diagnosis not present

## 2020-10-03 DIAGNOSIS — Z466 Encounter for fitting and adjustment of urinary device: Secondary | ICD-10-CM | POA: Diagnosis not present

## 2020-10-03 DIAGNOSIS — N136 Pyonephrosis: Secondary | ICD-10-CM | POA: Diagnosis not present

## 2020-10-03 DIAGNOSIS — A4181 Sepsis due to Enterococcus: Secondary | ICD-10-CM | POA: Diagnosis not present

## 2020-10-03 DIAGNOSIS — A4159 Other Gram-negative sepsis: Secondary | ICD-10-CM | POA: Diagnosis not present

## 2020-10-04 ENCOUNTER — Ambulatory Visit: Payer: Medicare Other | Admitting: Urology

## 2020-10-04 DIAGNOSIS — A4152 Sepsis due to Pseudomonas: Secondary | ICD-10-CM | POA: Diagnosis not present

## 2020-10-04 DIAGNOSIS — A4159 Other Gram-negative sepsis: Secondary | ICD-10-CM | POA: Diagnosis not present

## 2020-10-04 DIAGNOSIS — L89152 Pressure ulcer of sacral region, stage 2: Secondary | ICD-10-CM | POA: Diagnosis not present

## 2020-10-04 DIAGNOSIS — Z466 Encounter for fitting and adjustment of urinary device: Secondary | ICD-10-CM | POA: Diagnosis not present

## 2020-10-04 DIAGNOSIS — N136 Pyonephrosis: Secondary | ICD-10-CM | POA: Diagnosis not present

## 2020-10-04 DIAGNOSIS — A4181 Sepsis due to Enterococcus: Secondary | ICD-10-CM | POA: Diagnosis not present

## 2020-10-05 DIAGNOSIS — A4159 Other Gram-negative sepsis: Secondary | ICD-10-CM | POA: Diagnosis not present

## 2020-10-05 DIAGNOSIS — A4181 Sepsis due to Enterococcus: Secondary | ICD-10-CM | POA: Diagnosis not present

## 2020-10-05 DIAGNOSIS — N136 Pyonephrosis: Secondary | ICD-10-CM | POA: Diagnosis not present

## 2020-10-05 DIAGNOSIS — A4152 Sepsis due to Pseudomonas: Secondary | ICD-10-CM | POA: Diagnosis not present

## 2020-10-05 DIAGNOSIS — L89152 Pressure ulcer of sacral region, stage 2: Secondary | ICD-10-CM | POA: Diagnosis not present

## 2020-10-05 DIAGNOSIS — Z466 Encounter for fitting and adjustment of urinary device: Secondary | ICD-10-CM | POA: Diagnosis not present

## 2020-10-08 DIAGNOSIS — L89152 Pressure ulcer of sacral region, stage 2: Secondary | ICD-10-CM | POA: Diagnosis not present

## 2020-10-08 DIAGNOSIS — A4152 Sepsis due to Pseudomonas: Secondary | ICD-10-CM | POA: Diagnosis not present

## 2020-10-08 DIAGNOSIS — N136 Pyonephrosis: Secondary | ICD-10-CM | POA: Diagnosis not present

## 2020-10-08 DIAGNOSIS — A4159 Other Gram-negative sepsis: Secondary | ICD-10-CM | POA: Diagnosis not present

## 2020-10-08 DIAGNOSIS — A4181 Sepsis due to Enterococcus: Secondary | ICD-10-CM | POA: Diagnosis not present

## 2020-10-08 DIAGNOSIS — Z466 Encounter for fitting and adjustment of urinary device: Secondary | ICD-10-CM | POA: Diagnosis not present

## 2020-10-09 DIAGNOSIS — A4181 Sepsis due to Enterococcus: Secondary | ICD-10-CM | POA: Diagnosis not present

## 2020-10-09 DIAGNOSIS — A4152 Sepsis due to Pseudomonas: Secondary | ICD-10-CM | POA: Diagnosis not present

## 2020-10-09 DIAGNOSIS — N136 Pyonephrosis: Secondary | ICD-10-CM | POA: Diagnosis not present

## 2020-10-09 DIAGNOSIS — Z466 Encounter for fitting and adjustment of urinary device: Secondary | ICD-10-CM | POA: Diagnosis not present

## 2020-10-09 DIAGNOSIS — A4159 Other Gram-negative sepsis: Secondary | ICD-10-CM | POA: Diagnosis not present

## 2020-10-09 DIAGNOSIS — L89152 Pressure ulcer of sacral region, stage 2: Secondary | ICD-10-CM | POA: Diagnosis not present

## 2020-10-12 DIAGNOSIS — N184 Chronic kidney disease, stage 4 (severe): Secondary | ICD-10-CM | POA: Diagnosis not present

## 2020-10-12 DIAGNOSIS — J8410 Pulmonary fibrosis, unspecified: Secondary | ICD-10-CM | POA: Diagnosis not present

## 2020-10-12 DIAGNOSIS — K5900 Constipation, unspecified: Secondary | ICD-10-CM | POA: Diagnosis not present

## 2020-10-12 DIAGNOSIS — D631 Anemia in chronic kidney disease: Secondary | ICD-10-CM | POA: Diagnosis not present

## 2020-10-12 DIAGNOSIS — Z435 Encounter for attention to cystostomy: Secondary | ICD-10-CM | POA: Diagnosis not present

## 2020-10-12 DIAGNOSIS — E039 Hypothyroidism, unspecified: Secondary | ICD-10-CM | POA: Diagnosis not present

## 2020-10-12 DIAGNOSIS — G35 Multiple sclerosis: Secondary | ICD-10-CM | POA: Diagnosis not present

## 2020-10-12 DIAGNOSIS — Q603 Renal hypoplasia, unilateral: Secondary | ICD-10-CM | POA: Diagnosis not present

## 2020-10-12 DIAGNOSIS — M549 Dorsalgia, unspecified: Secondary | ICD-10-CM | POA: Diagnosis not present

## 2020-10-12 DIAGNOSIS — I69354 Hemiplegia and hemiparesis following cerebral infarction affecting left non-dominant side: Secondary | ICD-10-CM | POA: Diagnosis not present

## 2020-10-12 DIAGNOSIS — I480 Paroxysmal atrial fibrillation: Secondary | ICD-10-CM | POA: Diagnosis not present

## 2020-10-12 DIAGNOSIS — N319 Neuromuscular dysfunction of bladder, unspecified: Secondary | ICD-10-CM | POA: Diagnosis not present

## 2020-10-12 DIAGNOSIS — R1310 Dysphagia, unspecified: Secondary | ICD-10-CM | POA: Diagnosis not present

## 2020-10-12 DIAGNOSIS — I2510 Atherosclerotic heart disease of native coronary artery without angina pectoris: Secondary | ICD-10-CM | POA: Diagnosis not present

## 2020-10-12 DIAGNOSIS — Z466 Encounter for fitting and adjustment of urinary device: Secondary | ICD-10-CM | POA: Diagnosis not present

## 2020-10-12 DIAGNOSIS — I959 Hypotension, unspecified: Secondary | ICD-10-CM | POA: Diagnosis not present

## 2020-10-12 DIAGNOSIS — I5032 Chronic diastolic (congestive) heart failure: Secondary | ICD-10-CM | POA: Diagnosis not present

## 2020-10-12 DIAGNOSIS — L89152 Pressure ulcer of sacral region, stage 2: Secondary | ICD-10-CM | POA: Diagnosis not present

## 2020-10-12 DIAGNOSIS — K439 Ventral hernia without obstruction or gangrene: Secondary | ICD-10-CM | POA: Diagnosis not present

## 2020-10-12 DIAGNOSIS — M1990 Unspecified osteoarthritis, unspecified site: Secondary | ICD-10-CM | POA: Diagnosis not present

## 2020-10-12 DIAGNOSIS — G8929 Other chronic pain: Secondary | ICD-10-CM | POA: Diagnosis not present

## 2020-10-12 DIAGNOSIS — G4700 Insomnia, unspecified: Secondary | ICD-10-CM | POA: Diagnosis not present

## 2020-10-12 DIAGNOSIS — R7303 Prediabetes: Secondary | ICD-10-CM | POA: Diagnosis not present

## 2020-10-12 DIAGNOSIS — G4733 Obstructive sleep apnea (adult) (pediatric): Secondary | ICD-10-CM | POA: Diagnosis not present

## 2020-10-12 DIAGNOSIS — I130 Hypertensive heart and chronic kidney disease with heart failure and stage 1 through stage 4 chronic kidney disease, or unspecified chronic kidney disease: Secondary | ICD-10-CM | POA: Diagnosis not present

## 2020-10-15 ENCOUNTER — Ambulatory Visit (HOSPITAL_COMMUNITY): Payer: Medicare Other

## 2020-10-16 ENCOUNTER — Ambulatory Visit: Payer: Medicare Other | Admitting: Urology

## 2020-10-16 ENCOUNTER — Ambulatory Visit (HOSPITAL_COMMUNITY): Payer: Medicare Other

## 2020-10-17 DIAGNOSIS — L89152 Pressure ulcer of sacral region, stage 2: Secondary | ICD-10-CM | POA: Diagnosis not present

## 2020-10-17 DIAGNOSIS — Z435 Encounter for attention to cystostomy: Secondary | ICD-10-CM | POA: Diagnosis not present

## 2020-10-17 DIAGNOSIS — Z466 Encounter for fitting and adjustment of urinary device: Secondary | ICD-10-CM | POA: Diagnosis not present

## 2020-10-17 DIAGNOSIS — I130 Hypertensive heart and chronic kidney disease with heart failure and stage 1 through stage 4 chronic kidney disease, or unspecified chronic kidney disease: Secondary | ICD-10-CM | POA: Diagnosis not present

## 2020-10-17 DIAGNOSIS — G35 Multiple sclerosis: Secondary | ICD-10-CM | POA: Diagnosis not present

## 2020-10-17 DIAGNOSIS — N319 Neuromuscular dysfunction of bladder, unspecified: Secondary | ICD-10-CM | POA: Diagnosis not present

## 2020-10-18 DIAGNOSIS — Z466 Encounter for fitting and adjustment of urinary device: Secondary | ICD-10-CM | POA: Diagnosis not present

## 2020-10-18 DIAGNOSIS — G35 Multiple sclerosis: Secondary | ICD-10-CM | POA: Diagnosis not present

## 2020-10-18 DIAGNOSIS — Z435 Encounter for attention to cystostomy: Secondary | ICD-10-CM | POA: Diagnosis not present

## 2020-10-18 DIAGNOSIS — I130 Hypertensive heart and chronic kidney disease with heart failure and stage 1 through stage 4 chronic kidney disease, or unspecified chronic kidney disease: Secondary | ICD-10-CM | POA: Diagnosis not present

## 2020-10-18 DIAGNOSIS — L89152 Pressure ulcer of sacral region, stage 2: Secondary | ICD-10-CM | POA: Diagnosis not present

## 2020-10-18 DIAGNOSIS — N319 Neuromuscular dysfunction of bladder, unspecified: Secondary | ICD-10-CM | POA: Diagnosis not present

## 2020-10-20 ENCOUNTER — Ambulatory Visit: Payer: Self-pay

## 2020-10-22 DIAGNOSIS — N319 Neuromuscular dysfunction of bladder, unspecified: Secondary | ICD-10-CM | POA: Diagnosis not present

## 2020-10-22 DIAGNOSIS — Z466 Encounter for fitting and adjustment of urinary device: Secondary | ICD-10-CM | POA: Diagnosis not present

## 2020-10-22 DIAGNOSIS — I130 Hypertensive heart and chronic kidney disease with heart failure and stage 1 through stage 4 chronic kidney disease, or unspecified chronic kidney disease: Secondary | ICD-10-CM | POA: Diagnosis not present

## 2020-10-22 DIAGNOSIS — L89152 Pressure ulcer of sacral region, stage 2: Secondary | ICD-10-CM | POA: Diagnosis not present

## 2020-10-22 DIAGNOSIS — G35 Multiple sclerosis: Secondary | ICD-10-CM | POA: Diagnosis not present

## 2020-10-22 DIAGNOSIS — Z435 Encounter for attention to cystostomy: Secondary | ICD-10-CM | POA: Diagnosis not present

## 2020-10-24 DIAGNOSIS — Z8616 Personal history of COVID-19: Secondary | ICD-10-CM | POA: Diagnosis not present

## 2020-10-24 DIAGNOSIS — Z9981 Dependence on supplemental oxygen: Secondary | ICD-10-CM | POA: Diagnosis not present

## 2020-10-25 ENCOUNTER — Telehealth: Payer: Self-pay

## 2020-10-25 DIAGNOSIS — Z466 Encounter for fitting and adjustment of urinary device: Secondary | ICD-10-CM | POA: Diagnosis not present

## 2020-10-25 DIAGNOSIS — I130 Hypertensive heart and chronic kidney disease with heart failure and stage 1 through stage 4 chronic kidney disease, or unspecified chronic kidney disease: Secondary | ICD-10-CM | POA: Diagnosis not present

## 2020-10-25 DIAGNOSIS — L89152 Pressure ulcer of sacral region, stage 2: Secondary | ICD-10-CM | POA: Diagnosis not present

## 2020-10-25 DIAGNOSIS — Z435 Encounter for attention to cystostomy: Secondary | ICD-10-CM | POA: Diagnosis not present

## 2020-10-25 DIAGNOSIS — G35 Multiple sclerosis: Secondary | ICD-10-CM | POA: Diagnosis not present

## 2020-10-25 DIAGNOSIS — N319 Neuromuscular dysfunction of bladder, unspecified: Secondary | ICD-10-CM | POA: Diagnosis not present

## 2020-10-26 DIAGNOSIS — N319 Neuromuscular dysfunction of bladder, unspecified: Secondary | ICD-10-CM | POA: Diagnosis not present

## 2020-10-26 DIAGNOSIS — I130 Hypertensive heart and chronic kidney disease with heart failure and stage 1 through stage 4 chronic kidney disease, or unspecified chronic kidney disease: Secondary | ICD-10-CM | POA: Diagnosis not present

## 2020-10-26 DIAGNOSIS — L89152 Pressure ulcer of sacral region, stage 2: Secondary | ICD-10-CM | POA: Diagnosis not present

## 2020-10-26 DIAGNOSIS — Z435 Encounter for attention to cystostomy: Secondary | ICD-10-CM | POA: Diagnosis not present

## 2020-10-26 DIAGNOSIS — G35 Multiple sclerosis: Secondary | ICD-10-CM | POA: Diagnosis not present

## 2020-10-26 DIAGNOSIS — Z466 Encounter for fitting and adjustment of urinary device: Secondary | ICD-10-CM | POA: Diagnosis not present

## 2020-10-29 NOTE — Telephone Encounter (Signed)
Pt was placed on antibiotics by pcp per wife and Dr. [name] made aware.

## 2020-10-30 ENCOUNTER — Ambulatory Visit (HOSPITAL_COMMUNITY)
Admission: RE | Admit: 2020-10-30 | Discharge: 2020-10-30 | Disposition: A | Discharge status: Home / Self Care | Payer: Medicare Other | Source: Ambulatory Visit | Attending: Urology | Admitting: Urology

## 2020-10-30 ENCOUNTER — Other Ambulatory Visit: Payer: Self-pay

## 2020-10-30 DIAGNOSIS — N319 Neuromuscular dysfunction of bladder, unspecified: Secondary | ICD-10-CM | POA: Diagnosis not present

## 2020-10-30 DIAGNOSIS — G35 Multiple sclerosis: Secondary | ICD-10-CM | POA: Diagnosis not present

## 2020-10-30 DIAGNOSIS — L89152 Pressure ulcer of sacral region, stage 2: Secondary | ICD-10-CM | POA: Diagnosis not present

## 2020-10-30 DIAGNOSIS — Z466 Encounter for fitting and adjustment of urinary device: Secondary | ICD-10-CM | POA: Diagnosis not present

## 2020-10-30 DIAGNOSIS — I130 Hypertensive heart and chronic kidney disease with heart failure and stage 1 through stage 4 chronic kidney disease, or unspecified chronic kidney disease: Secondary | ICD-10-CM | POA: Diagnosis not present

## 2020-10-30 DIAGNOSIS — N201 Calculus of ureter: Secondary | ICD-10-CM | POA: Insufficient documentation

## 2020-10-30 DIAGNOSIS — Z435 Encounter for attention to cystostomy: Secondary | ICD-10-CM | POA: Diagnosis not present

## 2020-10-30 DIAGNOSIS — N281 Cyst of kidney, acquired: Secondary | ICD-10-CM | POA: Diagnosis not present

## 2020-10-30 DIAGNOSIS — N1330 Unspecified hydronephrosis: Secondary | ICD-10-CM | POA: Diagnosis not present

## 2020-11-01 DIAGNOSIS — G35 Multiple sclerosis: Secondary | ICD-10-CM | POA: Diagnosis not present

## 2020-11-01 DIAGNOSIS — N319 Neuromuscular dysfunction of bladder, unspecified: Secondary | ICD-10-CM | POA: Diagnosis not present

## 2020-11-01 DIAGNOSIS — Z466 Encounter for fitting and adjustment of urinary device: Secondary | ICD-10-CM | POA: Diagnosis not present

## 2020-11-01 DIAGNOSIS — L89152 Pressure ulcer of sacral region, stage 2: Secondary | ICD-10-CM | POA: Diagnosis not present

## 2020-11-01 DIAGNOSIS — Z435 Encounter for attention to cystostomy: Secondary | ICD-10-CM | POA: Diagnosis not present

## 2020-11-01 DIAGNOSIS — I130 Hypertensive heart and chronic kidney disease with heart failure and stage 1 through stage 4 chronic kidney disease, or unspecified chronic kidney disease: Secondary | ICD-10-CM | POA: Diagnosis not present

## 2020-11-05 ENCOUNTER — Other Ambulatory Visit: Payer: Self-pay | Admitting: Nurse Practitioner

## 2020-11-06 ENCOUNTER — Ambulatory Visit (INDEPENDENT_AMBULATORY_CARE_PROVIDER_SITE_OTHER): Payer: Medicare Other | Admitting: Urology

## 2020-11-06 ENCOUNTER — Encounter: Payer: Self-pay | Admitting: Urology

## 2020-11-06 ENCOUNTER — Other Ambulatory Visit: Payer: Self-pay

## 2020-11-06 VITALS — BP 201/105 | HR 92 | Temp 98.6°F | Ht 66.0 in | Wt 115.0 lb

## 2020-11-06 DIAGNOSIS — N201 Calculus of ureter: Secondary | ICD-10-CM

## 2020-11-06 NOTE — Progress Notes (Signed)
H&P  Chief Complaint: Hx Kidney Stones  History of Present Illness:   10.4.2019:Jordan Ward returns today for cystoscopy and SP tube change. He had a stone removed at his last tube change from the tract and he is to have cystoscopy to look for more stones. He was treated for a UTI after his last tube change.  8.17.2021: Jordan Ward presents for scheduling of his ureteroscopy. He had urgent placement of a left percutaneous nephrostomy tube approximately 2 weeks ago. That was for an obstructing, infected ureteral stone. He still has percutaneous tube present. He has had no issues with this.  Pt here for f/u with his bladder stone treatment plan. Pt has recently lost a significant amount of weight following dietary limitation on sodium. He has also had a marked reduction in ambulation. Pt will finish his pre-op antibiotics on Friday (8.20.2021).  9.21.2021: Jordan Ward left ureteroscopy with stone extraction and stent placement on August 24. He did well initially but over the past week or so has had left flank pain and had a low-grade fever last night.  12.7.2021: Presents today for cystoscopy and left double-J stent extraction.  He has had Covid recently and was hospitalized for this.  He has been started on Keflex recently.  2.8.2022: Pt reports that following his last SP tube change he went septic (prior to getting scrip for abx filled)  and was admitted to the hospital on 12.8.2022. He has not had his SP tube changed recently.    Past Medical History:  Diagnosis Date   Anemia    Arthritis    Back pain, chronic    Bilateral carotid bruits    C. difficile colitis 09/2011   CAD (coronary artery disease)    Carotid artery stenosis    Cerebrovascular disease    Cerebrovascular disease 08/17/2018   Colon polyps    Complication of cystostomy catheter, initial encounter ([place]) 04/20/2020   Dyslipidemia    Dysphagia 10/07/2011   Encephalopathy    Gait disorder    HA  (headache)    High grade dysplasia in colonic adenoma 09/2005   History of kidney stones    HTN (hypertension), malignant 10/06/2011   Hypernatremia    Hypokalemia    Hypothyroidism 10/08/2011   Insomnia    Junctional rhythm    Kidney stones    MS (multiple sclerosis) (HCC)    Neuromuscular disorder (HCC)    MS   OSA (obstructive sleep apnea)    Paroxysmal atrial tachycardia ([place])    Peripheral vascular disease ([place])    Pneumonia 4 yrs ago   Pulmonary fibrosis ([place]) 10/06/2011   Pulmonary nodule 10/08/2011   PVD (peripheral vascular disease) ([place])    Sacral ulcer (HCC)    Sleep apnea    cannot tolerate   Stroke ([place])    left sided weakness   Suprapubic catheter ([place])    TIA (transient ischemic attack)    Tremors of nervous system 10/08/2011   Urinary tract infection    Ventral hernia without obstruction or gangrene     Large 8X9cm ventral hernia with loss of domain. CT reads report as diastasis recti with herniation or diastasis recti.  Dr. Constance Haw, Surgery, reviewed CT with radiology and there is herniation with only hernia sac or peritoneum over the bowel and large separation of the rectus muslce (i.e. diastasis recti aka loss of domain).  No surgical intervention recommended given size, age, and health.     Past Surgical History:  Procedure Laterality Date   APPENDECTOMY  09/2005  at time of left hemicolectomy   BACK SURGERY  1976/1979   lower   BILIARY DILATION N/A 03/03/2020   Procedure: BILIARY DILATION;  Surgeon: Rogene Houston, MD;  [facility];  Service: Endoscopy;  Laterality: N/A;   CATARACT EXTRACTION W/PHACO Right 03/08/2018   Procedure: CATARACT EXTRACTION PHACO AND INTRAOCULAR LENS PLACEMENT RIGHT EYE;  Surgeon: Tonny Branch, MD;  Location: [facility];  Service: Ophthalmology;  Laterality: Right;  CDE: 8.86   CATARACT EXTRACTION W/PHACO Left 04/05/2018   Procedure: CATARACT EXTRACTION PHACO AND INTRAOCULAR LENS PLACEMENT (IOC);   Surgeon: Tonny Branch, MD;  Location: [facility];  Service: Ophthalmology;  Laterality: Left;  CDE: 7.36   CENTRAL LINE INSERTION-TUNNELED Right 09/11/2020   Procedure: PLACEMENT OF TUNNELED CENTRAL LINE INTO JUGULAR VEIN;  Surgeon: Virl Cagey, MD;  Location: [facility];  Service: General;  Laterality: Right;   CHOLECYSTECTOMY     Dr. Tamala Julian   COLON SURGERY  09/2005   [place]man: four tubular adenomas, large adenomatous polyp with HIGH GRADE dysplasia   COLONOSCOPY  11/2004   Dr. Sharol Roussel sessile polyp splenic flexure, 63mm sessile polyp desc colon, tubulovillous adenoma (bx not removed)   COLONOSCOPY  01/2005   poor prep, polyp could not be found   COLONOSCOPY  05/2005   with EMR, polypectomy Dr. Olegario Messier, bx showed high grade dysplasia, partially resected   COLONOSCOPY  09/2005   Dr. Arsenio Loader, [place] ink tattooing, four villous colon polyp (3 had been missed on previous colonoscopies due to limitations of procedures   COLONOSCOPY  09/2006   normal TI, no polyps   COLONOSCOPY  10/2007   Dr. Imogene Burn distal mammillations, benign bx, normal TI, random bx neg for microscopic colitis   CYSTOSCOPY W/ URETERAL STENT PLACEMENT Left 09/05/2020   Procedure: CYSTOSCOPY WITH RETROGRADE PYELOGRAM/URETERAL STENT PLACEMENT;  Surgeon: Ardis Hughs, MD;  Location: [facility];  Service: Urology;  Laterality: Left;   CYSTOSCOPY WITH LITHOLAPAXY N/A 07/27/2018   Procedure: CYSTOSCOPY WITH LITHOLAPAXY VIA  SUPRAPUBIC TUBE;  Surgeon: Franchot Gallo, MD;  Location: [facility];  Service: Urology;  Laterality: N/A;   CYSTOSCOPY WITH RETROGRADE PYELOGRAM, URETEROSCOPY AND STENT PLACEMENT Left 06/09/2017   Procedure: CYSTOSCOPY WITH LEFT RETROGRADE PYELOGRAM, LEFT URETEROSCOPY, LEFT URETEROSCOPIC STONE EXTRACTION, LEFT URETERAL STENT PLACEMENT;  Surgeon: Franchot Gallo, MD;  Location: [facility];  Service: Urology;  Laterality: Left;   CYSTOSCOPY/URETEROSCOPY/HOLMIUM LASER/STENT PLACEMENT Left  05/22/2020   Procedure: CYSTOSCOPY/URETEROSCOPY/STENT PLACEMENT;  Surgeon: Franchot Gallo, MD;  Location: [facility];  Service: Urology;  Laterality: Left;   ERCP N/A 03/03/2020   Procedure: ENDOSCOPIC RETROGRADE CHOLANGIOPANCREATOGRAPHY (ERCP);  Surgeon: Rogene Houston, MD;  [facility];  Service: Endoscopy;  Laterality: N/A;   ERCP N/A 04/20/2020   Procedure: ENDOSCOPIC RETROGRADE CHOLANGIOPANCREATOGRAPHY (ERCP);  Surgeon: Rogene Houston, MD;  [facility];  Service: Endoscopy;  Laterality: N/A;  to be done at 7:30am in [place] N/A 04/20/2020   Procedure: STENT REMOVAL;  Surgeon: Rogene Houston, MD;  [facility];  Service: Endoscopy;  Laterality: N/A;   INGUINAL HERNIA REPAIR  1971   bilateral   INSERTION OF SUPRAPUBIC CATHETER  06/09/2017   Procedure: EXCHANGE OF SUPRAPUBIC CATHETER;  Surgeon: Franchot Gallo, MD;  Location: [facility];  Service: Urology;;   INSERTION OF SUPRAPUBIC CATHETER  05/22/2020   Procedure: SUPRAPUBIC CATHETER EXCHANGE;  Surgeon: Franchot Gallo, MD;  Location: [facility];  Service: Urology;;   IR NEPHROSTOMY PLACEMENT LEFT  05/26/2017  IR NEPHROSTOMY PLACEMENT LEFT  05/02/2020   KIDNEY STONE SURGERY  09/13/2015   LITHOTRIPSY N/A 03/03/2020   Procedure: MECHANICAL LITHOTRIPSY WITH REMOVAL OF MULTIPLE STONE FRAGMENTS;  Surgeon: Rogene Houston, MD;  [facility];  Service: Endoscopy;  Laterality: N/A;   NEPHROLITHOTOMY Left 09/13/2015   Procedure: LEFT PERCUTANEOUS NEPHROLITHOTOMY ;  Surgeon: Franchot Gallo, MD;  Location: [facility];  Service: Urology;  Laterality: Left;   NEPHROSTOMY TUBE REMOVAL  05/22/2020   Procedure: NEPHROSTOMY TUBE REMOVAL;  Surgeon: Franchot Gallo, MD;  Location: [facility];  Service: Urology;;   [facility] REMOVAL Right 09/20/2020   Procedure: MINOR REMOVAL CENTRAL LINE;  Surgeon: Virl Cagey, MD;  Location: [facility];  Service: General;  Laterality: Right;   Pt to arrive at 7:30am for procedure   REMOVAL OF STONES N/A 04/20/2020   Procedure: REMOVAL OF STONES;  Surgeon: Rogene Houston, MD;  [facility];  Service: Endoscopy;  Laterality: N/A;   SPHINCTEROTOMY  03/03/2020   Procedure: BILLARY SPHINCTEROTOMY;  Surgeon: Rogene Houston, MD;  [facility];  Service: Endoscopy;;   SUPRAPUBIC CATHETER INSERTION      Home Medications:  Allergies as of 11/06/2020      Reactions   Tetracyclines & Related Anaphylaxis, Rash   Ciprofloxacin    Trouble swallowing unknown reaction according to wife       Medication List       Accurate as of November 06, 2020  3:13 PM. If you have any questions, ask your nurse or doctor.        acetaminophen 325 MG tablet Commonly known as: TYLENOL Take 2 tablets (650 mg total) by mouth every 6 (six) hours as needed for mild pain or headache (fever >/= 101).   ascorbic acid 500 MG tablet Commonly known as: VITAMIN C Take 1 tablet (500 mg total) by mouth 2 (two) times daily.   diazepam 5 MG tablet Commonly known as: VALIUM Take 1 tablet (5 mg total) by mouth every 12 (twelve) hours as needed for muscle spasms.   guaiFENesin-dextromethorphan 100-10 MG/5ML syrup Commonly known as: ROBITUSSIN DM Take 10 mLs by mouth every 4 (four) hours as needed for cough.   HYDROcodone-acetaminophen 7.5-325 MG tablet Commonly known as: NORCO Take 1 tablet by mouth 2 (two) times daily. Max APAP 3 GM IN 24 HOURS FROM ALL SOURCES What changed:   when to take this  additional instructions   Ipratropium-Albuterol 20-100 MCG/ACT Aers respimat Commonly known as: COMBIVENT Inhale 1 puff into the lungs every 6 (six) hours as needed for wheezing.   levothyroxine 75 MCG tablet Commonly known as: SYNTHROID TAKE ONE TABLET BY MOUTH ONCE DAILY.   multivitamin with minerals Tabs tablet Take 1 tablet by mouth daily.   Resource ThickenUp Clear Powd Use as directed       Allergies:  Allergies   Allergen Reactions   Tetracyclines & Related Anaphylaxis and Rash   Ciprofloxacin     Trouble swallowing unknown reaction according to wife     Family History  Problem Relation Age of Onset   Cirrhosis Brother        etoh   Stroke Mother 9   Coronary artery disease Father 36   Heart attack Brother    Cancer Sister    Multiple sclerosis Other    Colon cancer Neg Hx     Social History:  reports that he quit smoking about 32 years ago. His smoking use included cigarettes. He has a  25.00 pack-year smoking history. He has never used smokeless tobacco. He reports that he does not drink alcohol and does not use drugs.  ROS: A complete review of systems was performed.  All systems are negative except for pertinent findings as noted.  Physical Exam:  Vital signs in last 24 hours: There were no vitals taken for this visit. Constitutional:  Alert and oriented, No acute distress Respiratory: Pt supplementing oxygen GI: Abdomen is soft, nontender, nondistended, no abdominal masses. No CVAT. Ventral hernia. Neurologic: Grossly intact, no focal deficits Psychiatric: Normal mood and affect  I have reviewed prior pt notes  I have reviewed notes from referring/previous physicians  I have reviewed urinalysis results  I have independently reviewed prior imaging  I have reviewed prior urine culture   Procedure- his old suprapubic tube was removed.  Suprapubic site cleansed with Betadine.  51 French Foley placed is new suprapubic tube.  This was hooked to a bedside bag.  The patient tolerated the procedure well.  Impression/Assessment:  Hydronephrosis - prior CT image reviewed and hydronephrosis of left kidney noted.   Suprapubic Tube - Pt SP tube was changed w/out issue.  Renal/Ureteral Calculi -  We need to rule out left ureteral stone  Plan:  1. Pt will be followed up with a CT for evaluation of his hydronephrosis  2. Pt will return in 3 weeks for SP tube change.  3.  F/U depending on CT results.  CC: Jordan Ward

## 2020-11-07 ENCOUNTER — Telehealth: Payer: Self-pay

## 2020-11-07 DIAGNOSIS — L89152 Pressure ulcer of sacral region, stage 2: Secondary | ICD-10-CM | POA: Diagnosis not present

## 2020-11-07 DIAGNOSIS — Z435 Encounter for attention to cystostomy: Secondary | ICD-10-CM | POA: Diagnosis not present

## 2020-11-07 DIAGNOSIS — G35 Multiple sclerosis: Secondary | ICD-10-CM | POA: Diagnosis not present

## 2020-11-07 DIAGNOSIS — N319 Neuromuscular dysfunction of bladder, unspecified: Secondary | ICD-10-CM | POA: Diagnosis not present

## 2020-11-07 DIAGNOSIS — I130 Hypertensive heart and chronic kidney disease with heart failure and stage 1 through stage 4 chronic kidney disease, or unspecified chronic kidney disease: Secondary | ICD-10-CM | POA: Diagnosis not present

## 2020-11-07 DIAGNOSIS — Z466 Encounter for fitting and adjustment of urinary device: Secondary | ICD-10-CM | POA: Diagnosis not present

## 2020-11-10 DIAGNOSIS — L89152 Pressure ulcer of sacral region, stage 2: Secondary | ICD-10-CM | POA: Diagnosis not present

## 2020-11-10 DIAGNOSIS — G35 Multiple sclerosis: Secondary | ICD-10-CM | POA: Diagnosis not present

## 2020-11-10 DIAGNOSIS — N319 Neuromuscular dysfunction of bladder, unspecified: Secondary | ICD-10-CM | POA: Diagnosis not present

## 2020-11-10 DIAGNOSIS — Z435 Encounter for attention to cystostomy: Secondary | ICD-10-CM | POA: Diagnosis not present

## 2020-11-10 DIAGNOSIS — I130 Hypertensive heart and chronic kidney disease with heart failure and stage 1 through stage 4 chronic kidney disease, or unspecified chronic kidney disease: Secondary | ICD-10-CM | POA: Diagnosis not present

## 2020-11-10 DIAGNOSIS — Z466 Encounter for fitting and adjustment of urinary device: Secondary | ICD-10-CM | POA: Diagnosis not present

## 2020-11-11 DIAGNOSIS — R7303 Prediabetes: Secondary | ICD-10-CM | POA: Diagnosis not present

## 2020-11-11 DIAGNOSIS — G35 Multiple sclerosis: Secondary | ICD-10-CM | POA: Diagnosis not present

## 2020-11-11 DIAGNOSIS — L89152 Pressure ulcer of sacral region, stage 2: Secondary | ICD-10-CM | POA: Diagnosis not present

## 2020-11-11 DIAGNOSIS — I5032 Chronic diastolic (congestive) heart failure: Secondary | ICD-10-CM | POA: Diagnosis not present

## 2020-11-11 DIAGNOSIS — R1310 Dysphagia, unspecified: Secondary | ICD-10-CM | POA: Diagnosis not present

## 2020-11-11 DIAGNOSIS — N184 Chronic kidney disease, stage 4 (severe): Secondary | ICD-10-CM | POA: Diagnosis not present

## 2020-11-11 DIAGNOSIS — M549 Dorsalgia, unspecified: Secondary | ICD-10-CM | POA: Diagnosis not present

## 2020-11-11 DIAGNOSIS — G4700 Insomnia, unspecified: Secondary | ICD-10-CM | POA: Diagnosis not present

## 2020-11-11 DIAGNOSIS — I69354 Hemiplegia and hemiparesis following cerebral infarction affecting left non-dominant side: Secondary | ICD-10-CM | POA: Diagnosis not present

## 2020-11-11 DIAGNOSIS — I480 Paroxysmal atrial fibrillation: Secondary | ICD-10-CM | POA: Diagnosis not present

## 2020-11-11 DIAGNOSIS — K5900 Constipation, unspecified: Secondary | ICD-10-CM | POA: Diagnosis not present

## 2020-11-11 DIAGNOSIS — E039 Hypothyroidism, unspecified: Secondary | ICD-10-CM | POA: Diagnosis not present

## 2020-11-11 DIAGNOSIS — I130 Hypertensive heart and chronic kidney disease with heart failure and stage 1 through stage 4 chronic kidney disease, or unspecified chronic kidney disease: Secondary | ICD-10-CM | POA: Diagnosis not present

## 2020-11-11 DIAGNOSIS — Z435 Encounter for attention to cystostomy: Secondary | ICD-10-CM | POA: Diagnosis not present

## 2020-11-11 DIAGNOSIS — N319 Neuromuscular dysfunction of bladder, unspecified: Secondary | ICD-10-CM | POA: Diagnosis not present

## 2020-11-11 DIAGNOSIS — G8929 Other chronic pain: Secondary | ICD-10-CM | POA: Diagnosis not present

## 2020-11-11 DIAGNOSIS — K439 Ventral hernia without obstruction or gangrene: Secondary | ICD-10-CM | POA: Diagnosis not present

## 2020-11-11 DIAGNOSIS — M1990 Unspecified osteoarthritis, unspecified site: Secondary | ICD-10-CM | POA: Diagnosis not present

## 2020-11-11 DIAGNOSIS — G4733 Obstructive sleep apnea (adult) (pediatric): Secondary | ICD-10-CM | POA: Diagnosis not present

## 2020-11-11 DIAGNOSIS — Z466 Encounter for fitting and adjustment of urinary device: Secondary | ICD-10-CM | POA: Diagnosis not present

## 2020-11-11 DIAGNOSIS — D631 Anemia in chronic kidney disease: Secondary | ICD-10-CM | POA: Diagnosis not present

## 2020-11-11 DIAGNOSIS — Q603 Renal hypoplasia, unilateral: Secondary | ICD-10-CM | POA: Diagnosis not present

## 2020-11-11 DIAGNOSIS — I959 Hypotension, unspecified: Secondary | ICD-10-CM | POA: Diagnosis not present

## 2020-11-11 DIAGNOSIS — J8410 Pulmonary fibrosis, unspecified: Secondary | ICD-10-CM | POA: Diagnosis not present

## 2020-11-11 DIAGNOSIS — I2510 Atherosclerotic heart disease of native coronary artery without angina pectoris: Secondary | ICD-10-CM | POA: Diagnosis not present

## 2020-11-14 DIAGNOSIS — N319 Neuromuscular dysfunction of bladder, unspecified: Secondary | ICD-10-CM | POA: Diagnosis not present

## 2020-11-14 DIAGNOSIS — Z435 Encounter for attention to cystostomy: Secondary | ICD-10-CM | POA: Diagnosis not present

## 2020-11-14 DIAGNOSIS — I130 Hypertensive heart and chronic kidney disease with heart failure and stage 1 through stage 4 chronic kidney disease, or unspecified chronic kidney disease: Secondary | ICD-10-CM | POA: Diagnosis not present

## 2020-11-14 DIAGNOSIS — L89152 Pressure ulcer of sacral region, stage 2: Secondary | ICD-10-CM | POA: Diagnosis not present

## 2020-11-14 DIAGNOSIS — G35 Multiple sclerosis: Secondary | ICD-10-CM | POA: Diagnosis not present

## 2020-11-14 DIAGNOSIS — Z466 Encounter for fitting and adjustment of urinary device: Secondary | ICD-10-CM | POA: Diagnosis not present

## 2020-11-15 ENCOUNTER — Telehealth: Payer: Self-pay

## 2020-11-15 NOTE — Telephone Encounter (Signed)
Let her know it may be worthwhile to take an abx starting the day before

## 2020-11-19 NOTE — Telephone Encounter (Signed)
Pt's wife made aware. 

## 2020-11-19 NOTE — Progress Notes (Signed)
PATIENT: Jordan Ward DOB: 09-26-1941  REASON FOR VISIT: follow up HISTORY FROM: patient  HISTORY OF PRESENT ILLNESS: Today 11/20/20 Mr. Criscuolo is a 80 year old male with history of multiple sclerosis. He has been on diazepam for spasticity of the legs since he was 80 years old. Has neurogenic bladder and has a catheter in place.  He has a chronic gait disorder, no falls, using walker.  Here today with his wife. Had COVID PNA back in October, hospital for 9 days. Multiple hospital admission for sepsis. On oxygen since COVID, down to 1.5 L, trying to wean down. Has affected his mobility, personality, likes to be alone. He plays games on the phone. [place] is almost healed.   HISTORY 08/22/2019 Dr. Jannifer Franklin: Mr. Budnick is a 80 year old right-handed white male with a history of multiple sclerosis in the past, he has been on diazepam for spasticity in the legs since he was 80 years old.  The patient has not had any falls, he does have a chronic gait disorder, he has been neurologically relatively stable.  He does not use a walker inside the house, only outside of the house.  He has some issues with constipation, he has a neurogenic bladder and has a catheter in place.  The patient has occasional headaches but this is not a big issue for him.  He does not currently operate a motor vehicle.  He returns for an annual evaluation.  Since last seen, he did have cataract surgery but otherwise he has not had any new significant medical issues.   REVIEW OF SYSTEMS: Out of a complete 14 system review of symptoms, the patient complains only of the following symptoms, and all other reviewed systems are negative.  See HPI  ALLERGIES: Allergies  Allergen Reactions  . Tetracyclines & Related Anaphylaxis and Rash  . Ciprofloxacin     Trouble swallowing unknown reaction according to wife     HOME MEDICATIONS: Outpatient Medications Prior to Visit  Medication Sig Dispense Refill  . HYDROcodone-acetaminophen  (NORCO) 7.5-325 MG per tablet Take 1 tablet by mouth 2 (two) times daily. Max APAP 3 GM IN 24 HOURS FROM ALL SOURCES (Patient taking differently: Take 1 tablet by mouth in the morning and at bedtime. ALL MEDICATIONS TO BE CRUSHED AND PLACED IN APPLESAUCE) 60 tablet 0  . levothyroxine (SYNTHROID) 75 MCG tablet TAKE ONE TABLET BY MOUTH ONCE DAILY. 90 tablet 0  . diazepam (VALIUM) 5 MG tablet Take 1 tablet (5 mg total) by mouth every 12 (twelve) hours as needed for muscle spasms. 60 tablet 0  . acetaminophen (TYLENOL) 325 MG tablet Take 2 tablets (650 mg total) by mouth every 6 (six) hours as needed for mild pain or headache (fever >/= 101). 30 tablet 0  . ascorbic acid (VITAMIN C) 500 MG tablet Take 1 tablet (500 mg total) by mouth 2 (two) times daily. 30 tablet 0  . guaiFENesin-dextromethorphan (ROBITUSSIN DM) 100-10 MG/5ML syrup Take 10 mLs by mouth every 4 (four) hours as needed for cough. 118 mL 0  . Ipratropium-Albuterol (COMBIVENT) 20-100 MCG/ACT AERS respimat Inhale 1 puff into the lungs every 6 (six) hours as needed for wheezing. 4 g 0  . Maltodextrin-Xanthan Gum (RESOURCE THICKENUP CLEAR) POWD Use as directed 125 g g  . Multiple Vitamin (MULTIVITAMIN WITH MINERALS) TABS tablet Take 1 tablet by mouth daily. 30 tablet 0   No facility-administered medications prior to visit.    PAST MEDICAL HISTORY: Past Medical History:  Diagnosis Date  .  Anemia   . Arthritis   . Back pain, chronic   . Bilateral carotid bruits   . C. difficile colitis 09/2011  . CAD (coronary artery disease)   . Carotid artery stenosis   . Cerebrovascular disease   . Cerebrovascular disease 08/17/2018  . Colon polyps   . Complication of cystostomy catheter, initial encounter ([place]) 04/20/2020  . Dyslipidemia   . Dysphagia 10/07/2011  . Encephalopathy   . Gait disorder   . HA (headache)   . High grade dysplasia in colonic adenoma 09/2005  . History of kidney stones   . HTN (hypertension), malignant 10/06/2011  .  Hypernatremia   . Hypokalemia   . Hypothyroidism 10/08/2011  . Insomnia   . Junctional rhythm   . Kidney stones   . MS (multiple sclerosis) ([place])   . Neuromuscular disorder ([facility])    MS  . OSA (obstructive sleep apnea)   . Paroxysmal atrial tachycardia ([place])   . Peripheral vascular disease ([place])   . Pneumonia 4 yrs ago  . Pulmonary fibrosis ([place]) 10/06/2011  . Pulmonary nodule 10/08/2011  . PVD (peripheral vascular disease) ([place])   . Sacral ulcer ([place])   . Sleep apnea    cannot tolerate  . Stroke [facility])    left sided weakness  . Suprapubic catheter ([place])   . TIA (transient ischemic attack)   . Tremors of nervous system 10/08/2011  . Urinary tract infection   . Ventral hernia without obstruction or gangrene     Large 8X9cm ventral hernia with loss of domain. CT reads report as diastasis recti with herniation or diastasis recti.  Dr. Constance Haw, Surgery, reviewed CT with radiology and there is herniation with only hernia sac or peritoneum over the bowel and large separation of the rectus muslce (i.e. diastasis recti aka loss of domain).  No surgical intervention recommended given size, age, and health.     PAST SURGICAL HISTORY: Past Surgical History:  Procedure Laterality Date  . APPENDECTOMY  09/2005   at time of left hemicolectomy  . BACK SURGERY  1976/1979   lower  . BILIARY DILATION N/A 03/03/2020   Procedure: BILIARY DILATION;  Surgeon: Rogene Houston, MD;  [facility];  Service: Endoscopy;  Laterality: N/A;  . CATARACT EXTRACTION W/PHACO Right 03/08/2018   Procedure: CATARACT EXTRACTION PHACO AND INTRAOCULAR LENS PLACEMENT RIGHT EYE;  Surgeon: Tonny Branch, MD;  Location: [facility] OR[facility];  Service: Ophthalmology;  Laterality: Right;  CDE: 8.86  . CATARACT EXTRACTION W/PHACO Left 04/05/2018   Procedure: CATARACT EXTRACTION PHACO AND INTRAOCULAR LENS PLACEMENT (IOC);  Surgeon: Tonny Branch, MD;  Location: [facility];  Service: Ophthalmology;  Laterality: Left;  CDE: 7.36  . CENTRAL LINE  INSERTION-TUNNELED Right 09/11/2020   Procedure: PLACEMENT OF TUNNELED CENTRAL LINE INTO JUGULAR VEIN;  Surgeon: Virl Cagey, MD;  Location: [facility] OR[facility];  Service: General;  Laterality: Right;  . CHOLECYSTECTOMY     Dr. Tamala Julian  . COLON SURGERY  09/2005   [place]man: four tubular adenomas, large adenomatous polyp with HIGH GRADE dysplasia  . COLONOSCOPY  11/2004   Dr. Sharol Roussel sessile polyp splenic flexure, 43mm sessile polyp desc colon, tubulovillous adenoma (bx not removed)  . COLONOSCOPY  01/2005   poor prep, polyp could not be found  . COLONOSCOPY  05/2005   with EMR, polypectomy Dr. Olegario Messier, bx showed high grade dysplasia, partially resected  . COLONOSCOPY  09/2005   Dr. Arsenio Loader, [place] ink tattooing, four villous colon polyp (3 had been missed on previous  colonoscopies due to limitations of procedures  . COLONOSCOPY  09/2006   normal TI, no polyps  . COLONOSCOPY  10/2007   Dr. Imogene Burn distal mammillations, benign bx, normal TI, random bx neg for microscopic colitis  . CYSTOSCOPY W/ URETERAL STENT PLACEMENT Left 09/05/2020   Procedure: CYSTOSCOPY WITH RETROGRADE PYELOGRAM/URETERAL STENT PLACEMENT;  Surgeon: Ardis Hughs, MD;  Location: [facility];  Service: Urology;  Laterality: Left;  . CYSTOSCOPY WITH LITHOLAPAXY N/A 07/27/2018   Procedure: CYSTOSCOPY WITH LITHOLAPAXY VIA  SUPRAPUBIC TUBE;  Surgeon: Franchot Gallo, MD;  Location: [facility];  Service: Urology;  Laterality: N/A;  . CYSTOSCOPY WITH RETROGRADE PYELOGRAM, URETEROSCOPY AND STENT PLACEMENT Left 06/09/2017   Procedure: CYSTOSCOPY WITH LEFT RETROGRADE PYELOGRAM, LEFT URETEROSCOPY, LEFT URETEROSCOPIC STONE EXTRACTION, LEFT URETERAL STENT PLACEMENT;  Surgeon: Franchot Gallo, MD;  Location: [facility];  Service: Urology;  Laterality: Left;  . CYSTOSCOPY/URETEROSCOPY/HOLMIUM LASER/STENT PLACEMENT Left 05/22/2020   Procedure: CYSTOSCOPY/URETEROSCOPY/STENT PLACEMENT;  Surgeon: Franchot Gallo, MD;  Location: [facility];   Service: Urology;  Laterality: Left;  . ERCP N/A 03/03/2020   Procedure: ENDOSCOPIC RETROGRADE CHOLANGIOPANCREATOGRAPHY (ERCP);  Surgeon: Rogene Houston, MD;  [facility];  Service: Endoscopy;  Laterality: N/A;  . ERCP N/A 04/20/2020   Procedure: ENDOSCOPIC RETROGRADE CHOLANGIOPANCREATOGRAPHY (ERCP);  Surgeon: Rogene Houston, MD;  [facility];  Service: Endoscopy;  Laterality: N/A;  to be done at 7:30am in OR  . GASTROINTESTINAL STENT REMOVAL N/A 04/20/2020   Procedure: STENT REMOVAL;  Surgeon: Rogene Houston, MD;  [facility];  Service: Endoscopy;  Laterality: N/A;  . INGUINAL HERNIA REPAIR  1971   bilateral  . INSERTION OF SUPRAPUBIC CATHETER  06/09/2017   Procedure: EXCHANGE OF SUPRAPUBIC CATHETER;  Surgeon: Franchot Gallo, MD;  Location: [facility];  Service: Urology;;  . INSERTION OF SUPRAPUBIC CATHETER  05/22/2020   Procedure: SUPRAPUBIC CATHETER EXCHANGE;  Surgeon: Franchot Gallo, MD;  Location: [facility];  Service: Urology;;  . IR NEPHROSTOMY PLACEMENT LEFT  05/26/2017  . IR NEPHROSTOMY PLACEMENT LEFT  05/02/2020  . KIDNEY STONE SURGERY  09/13/2015  . LITHOTRIPSY N/A 03/03/2020   Procedure: MECHANICAL LITHOTRIPSY WITH REMOVAL OF MULTIPLE STONE FRAGMENTS;  Surgeon: Rogene Houston, MD;  [facility];  Service: Endoscopy;  Laterality: N/A;  . NEPHROLITHOTOMY Left 09/13/2015   Procedure: LEFT PERCUTANEOUS NEPHROLITHOTOMY ;  Surgeon: Franchot Gallo, MD;  Location: [facility];  Service: Urology;  Laterality: Left;  . NEPHROSTOMY TUBE REMOVAL  05/22/2020   Procedure: NEPHROSTOMY TUBE REMOVAL;  Surgeon: Franchot Gallo, MD;  Location: [facility];  Service: Urology;;  . PORT-A-CATH REMOVAL Right 09/20/2020   Procedure: MINOR REMOVAL CENTRAL LINE;  Surgeon: Virl Cagey, MD;  Location: [facility];  Service: General;  Laterality: Right;  Pt to arrive at 7:30am for procedure  . REMOVAL OF STONES N/A 04/20/2020   Procedure: REMOVAL OF STONES;  Surgeon:  Rogene Houston, MD;  [facility];  Service: Endoscopy;  Laterality: N/A;  . SPHINCTEROTOMY  03/03/2020   Procedure: BILLARY SPHINCTEROTOMY;  Surgeon: Rogene Houston, MD;  [facility];  Service: Endoscopy;;  . SUPRAPUBIC CATHETER INSERTION      FAMILY HISTORY: Family History  Problem Relation Age of Onset  . Cirrhosis Brother        etoh  . Stroke Mother 77  . Coronary artery disease Father 30  . Heart attack Brother   . Cancer Sister   . Multiple sclerosis Other   . Colon cancer  Neg Hx     SOCIAL HISTORY: Social History   Socioeconomic History  . Marital status: Married    Spouse name: Pricilla Holm  . Number of children: 2  . Years of education: 10  . Highest education level: Not on file  Occupational History  . Occupation: disability    Employer: RETIRED  Tobacco Use  . Smoking status: Former Smoker    Packs/day: 1.00    Years: 25.00    Pack years: 25.00    Types: Cigarettes    Quit date: 03/03/1988    Years since quitting: 32.7  . Smokeless tobacco: Never Used  Vaping Use  . Vaping Use: Never used  Substance and Sexual Activity  . Alcohol use: No  . Drug use: No  . Sexual activity: Never  Other Topics Concern  . Not on file  Social History Narrative   Patient is married Pricilla Holm) and  Lives w/ wife.   Patient is retired.   Patient has a 10th grade education.   Patient has two children.      Patient drinks about 1-2 sodas daily.   Patient is left handed.    Social Determinants of Health   Financial Resource Strain: Not on file  Food Insecurity: Not on file  Transportation Needs: Not on file  Physical Activity: Not on file  Stress: Not on file  Social Connections: Not on file  Intimate Partner Violence: Not on file   PHYSICAL EXAM  Vitals:   11/20/20 1453  BP: (!) 162/86  Pulse: 83  Weight: 113 lb (51.3 kg)  Height: 5\' 4"  (1.626 m)   Body mass index is 19.4 kg/m.  Generalized: Well developed, in no acute distress, frail  looking, wearing  O2 Neurological examination  Mentation: Alert oriented to time, place, history taking. Follows all commands speech and language fluent Cranial nerve II-XII: Pupils were equal round reactive to light. Extraocular movements were full, visual field were full on confrontational test. Facial sensation and strength were normal. Head turning and shoulder shrug were normal and symmetric. Motor: Good strength all extremities, no increased spasticity noted Sensory: Sensory testing is intact to soft touch on all 4 extremities. No evidence of extinction is noted.  Coordination: Cerebellar testing reveals good finger-nose-finger and heel-to-shin bilaterally.  Gait and station: Gait is slightly wide-based, cautious, good stability with a walker Reflexes: Deep tendon reflexes are symmetric   DIAGNOSTIC DATA (LABS, IMAGING, TESTING) - I reviewed patient records, labs, notes, testing and imaging myself where available.  Lab Results  Component Value Date   WBC 7.3 09/11/2020   HGB 8.3 (L) 09/11/2020   HCT 27.3 (L) 09/11/2020   MCV 97.5 09/11/2020   PLT 256 09/11/2020      Component Value Date/Time   NA 137 09/11/2020 0513   NA 136 (A) 09/27/2018 0000   K 3.5 09/11/2020 0513   CL 103 09/11/2020 0513   CO2 27 09/11/2020 0513   GLUCOSE 94 09/11/2020 0513   BUN 26 (H) 09/11/2020 0513   BUN 28 (A) 09/27/2018 0000   CREATININE 2.35 (H) 09/11/2020 0513   CALCIUM 8.9 09/11/2020 0513   PROT 5.4 (L) 07/12/2020 0426   ALBUMIN 2.7 (L) 09/11/2020 0513   AST 16 07/12/2020 0426   ALT 21 07/12/2020 0426   ALKPHOS 58 07/12/2020 0426   BILITOT 0.5 07/12/2020 0426   GFRNONAA 28 (L) 09/11/2020 0513   GFRAA 43 (L) 05/07/2020 0521   Lab Results  Component Value Date   CHOL 133  09/27/2018   HDL 39 09/27/2018   LDLCALC 72 09/27/2018   TRIG 108 07/03/2020   CHOLHDL 4.4 08/05/2015   Lab Results  Component Value Date   HGBA1C 6.3 (H) 07/04/2020   Lab Results  Component Value Date    VITAMINB12 405 09/08/2020   Lab Results  Component Value Date   TSH 1.14 09/27/2018   ASSESSMENT AND PLAN 80 y.o. year old male  has a past medical history of Anemia, Arthritis, Back pain, chronic, Bilateral carotid bruits, C. difficile colitis (09/2011), CAD (coronary artery disease), Carotid artery stenosis, Cerebrovascular disease, Cerebrovascular disease (08/17/2018), Colon polyps, Complication of cystostomy catheter, initial encounter ([place]) (04/20/2020), Dyslipidemia, Dysphagia (10/07/2011), Encephalopathy, Gait disorder, HA (headache), High grade dysplasia in colonic adenoma (09/2005), History of kidney stones, HTN (hypertension), malignant (10/06/2011), Hypernatremia, Hypokalemia, Hypothyroidism (10/08/2011), Insomnia, Junctional rhythm, Kidney stones, MS (multiple sclerosis) ([place]), Neuromuscular disorder ([place]), OSA (obstructive sleep apnea), Paroxysmal atrial tachycardia ([place]), Peripheral vascular disease ([place]), Pneumonia (4 yrs ago), Pulmonary fibrosis ([place]) (10/06/2011), Pulmonary nodule (10/08/2011), PVD (peripheral vascular disease) ([place]), Sacral ulcer ([place]), Sleep apnea, Stroke ([place]), Suprapubic catheter ([place]), TIA (transient ischemic attack), Tremors of nervous system (10/08/2011), Urinary tract infection, and Ventral hernia without obstruction or gangrene. here with:  1.  Cerebrovascular disease versus multiple sclerosis 2.  Gait disorder  -Has had a general decline in his overall health, following COVID, and several hospitalizations for sepsis -Continues to benefit from diazepam, refill was given (drug registry checked last fill was 11/05/20, earliest 12/03/20) -Seems to have remained neurologically stable -Follow-up in 1 year or sooner if needed  I spent 20 minutes of face-to-face and non-face-to-face time with patient.  This included previsit chart review, lab review, study review, order entry, electronic health record documentation, patient education.  Butler Denmark, AGNP-C, DNP 11/20/2020, 3:28  PM [facility] [address], [place], [place] [address] [phone]

## 2020-11-20 ENCOUNTER — Ambulatory Visit (INDEPENDENT_AMBULATORY_CARE_PROVIDER_SITE_OTHER): Payer: Medicare Other | Admitting: Neurology

## 2020-11-20 ENCOUNTER — Encounter: Payer: Self-pay | Admitting: Neurology

## 2020-11-20 ENCOUNTER — Ambulatory Visit: Payer: Medicare Other | Admitting: Neurology

## 2020-11-20 VITALS — BP 162/86 | HR 83 | Ht 64.0 in | Wt 113.0 lb

## 2020-11-20 DIAGNOSIS — R269 Unspecified abnormalities of gait and mobility: Secondary | ICD-10-CM | POA: Diagnosis not present

## 2020-11-20 DIAGNOSIS — I679 Cerebrovascular disease, unspecified: Secondary | ICD-10-CM | POA: Diagnosis not present

## 2020-11-20 MED ORDER — DIAZEPAM 5 MG PO TABS: 5.0000 mg | ORAL_TABLET | Freq: Two times a day (BID) | ORAL | 3 refills | Status: DC | PRN

## 2020-11-20 NOTE — Progress Notes (Signed)
I have read the note, and I agree with the clinical assessment and plan.  Charles K Willis   

## 2020-11-20 NOTE — Patient Instructions (Signed)
Continue Valium at current dose  Refill ready for 12/03/20 See you back in 1 year

## 2020-11-22 ENCOUNTER — Other Ambulatory Visit: Payer: Self-pay | Admitting: Urology

## 2020-11-22 DIAGNOSIS — N390 Urinary tract infection, site not specified: Secondary | ICD-10-CM

## 2020-11-22 MED ORDER — SULFAMETHOXAZOLE-TRIMETHOPRIM 800-160 MG PO TABS: ORAL_TABLET | ORAL | 1 refills | Status: DC

## 2020-11-22 NOTE — Telephone Encounter (Signed)
Jordan Ward's back in town.  Check with him please.

## 2020-11-22 NOTE — Telephone Encounter (Signed)
I sent a prescription for sulfa pills for him to start taking 2 days before each catheter change.

## 2020-11-23 DIAGNOSIS — G35 Multiple sclerosis: Secondary | ICD-10-CM | POA: Diagnosis not present

## 2020-11-23 DIAGNOSIS — I130 Hypertensive heart and chronic kidney disease with heart failure and stage 1 through stage 4 chronic kidney disease, or unspecified chronic kidney disease: Secondary | ICD-10-CM | POA: Diagnosis not present

## 2020-11-23 DIAGNOSIS — L89152 Pressure ulcer of sacral region, stage 2: Secondary | ICD-10-CM | POA: Diagnosis not present

## 2020-11-23 DIAGNOSIS — Z466 Encounter for fitting and adjustment of urinary device: Secondary | ICD-10-CM | POA: Diagnosis not present

## 2020-11-23 DIAGNOSIS — N319 Neuromuscular dysfunction of bladder, unspecified: Secondary | ICD-10-CM | POA: Diagnosis not present

## 2020-11-23 DIAGNOSIS — Z435 Encounter for attention to cystostomy: Secondary | ICD-10-CM | POA: Diagnosis not present

## 2020-11-23 NOTE — Telephone Encounter (Signed)
Patient wife called and made aware.

## 2020-11-23 NOTE — Telephone Encounter (Signed)
See other note

## 2020-11-26 ENCOUNTER — Other Ambulatory Visit: Payer: Self-pay

## 2020-11-26 ENCOUNTER — Ambulatory Visit (INDEPENDENT_AMBULATORY_CARE_PROVIDER_SITE_OTHER): Payer: Medicare Other

## 2020-11-26 DIAGNOSIS — R339 Retention of urine, unspecified: Secondary | ICD-10-CM | POA: Diagnosis not present

## 2020-11-26 NOTE — Progress Notes (Signed)
Cath Change/ Replacement  Patient is present today for a catheter change due to urinary retention.  77ml of water was removed from the balloon, a 24FR foley cath was removed with out difficulty.  Patient was cleaned and prepped in a sterile fashion with betadine. A 24 FR foley cath was replaced into the bladder. Urine return was noted 17ml and urine was yellow in color. The balloon was filled with 50ml of sterile water. A bed bag was attached for drainage. Patient was given proper instruction on catheter care.   Pt had leakage around cath site while urine also ran into tubing. Spoke with Dr. Alyson Ingles and he said it was probably bladder spasms. He was given 4 boxes of Myrbetriq 25mg  to take for spasms.   Performed by: Jeannia Tatro,LPN  Follow up:  3 weeks

## 2020-11-27 DIAGNOSIS — Z466 Encounter for fitting and adjustment of urinary device: Secondary | ICD-10-CM | POA: Diagnosis not present

## 2020-11-27 DIAGNOSIS — Z435 Encounter for attention to cystostomy: Secondary | ICD-10-CM | POA: Diagnosis not present

## 2020-11-27 DIAGNOSIS — G35 Multiple sclerosis: Secondary | ICD-10-CM | POA: Diagnosis not present

## 2020-11-27 DIAGNOSIS — I130 Hypertensive heart and chronic kidney disease with heart failure and stage 1 through stage 4 chronic kidney disease, or unspecified chronic kidney disease: Secondary | ICD-10-CM | POA: Diagnosis not present

## 2020-11-27 DIAGNOSIS — L89152 Pressure ulcer of sacral region, stage 2: Secondary | ICD-10-CM | POA: Diagnosis not present

## 2020-11-27 DIAGNOSIS — N319 Neuromuscular dysfunction of bladder, unspecified: Secondary | ICD-10-CM | POA: Diagnosis not present

## 2020-11-30 ENCOUNTER — Other Ambulatory Visit: Payer: Self-pay

## 2020-11-30 ENCOUNTER — Ambulatory Visit (HOSPITAL_COMMUNITY)
Admission: RE | Admit: 2020-11-30 | Discharge: 2020-11-30 | Disposition: A | Discharge status: Home / Self Care | Payer: Medicare Other | Source: Ambulatory Visit | Attending: Urology | Admitting: Urology

## 2020-11-30 DIAGNOSIS — N201 Calculus of ureter: Secondary | ICD-10-CM | POA: Diagnosis not present

## 2020-11-30 DIAGNOSIS — N210 Calculus in bladder: Secondary | ICD-10-CM | POA: Diagnosis not present

## 2020-11-30 DIAGNOSIS — N132 Hydronephrosis with renal and ureteral calculous obstruction: Secondary | ICD-10-CM | POA: Diagnosis not present

## 2020-11-30 DIAGNOSIS — N261 Atrophy of kidney (terminal): Secondary | ICD-10-CM | POA: Diagnosis not present

## 2020-11-30 DIAGNOSIS — N3289 Other specified disorders of bladder: Secondary | ICD-10-CM | POA: Diagnosis not present

## 2020-12-04 DIAGNOSIS — Z435 Encounter for attention to cystostomy: Secondary | ICD-10-CM | POA: Diagnosis not present

## 2020-12-04 DIAGNOSIS — I130 Hypertensive heart and chronic kidney disease with heart failure and stage 1 through stage 4 chronic kidney disease, or unspecified chronic kidney disease: Secondary | ICD-10-CM | POA: Diagnosis not present

## 2020-12-04 DIAGNOSIS — Z466 Encounter for fitting and adjustment of urinary device: Secondary | ICD-10-CM | POA: Diagnosis not present

## 2020-12-04 DIAGNOSIS — L89152 Pressure ulcer of sacral region, stage 2: Secondary | ICD-10-CM | POA: Diagnosis not present

## 2020-12-04 DIAGNOSIS — N319 Neuromuscular dysfunction of bladder, unspecified: Secondary | ICD-10-CM | POA: Diagnosis not present

## 2020-12-04 DIAGNOSIS — G35 Multiple sclerosis: Secondary | ICD-10-CM | POA: Diagnosis not present

## 2020-12-10 DIAGNOSIS — Z466 Encounter for fitting and adjustment of urinary device: Secondary | ICD-10-CM | POA: Diagnosis not present

## 2020-12-10 DIAGNOSIS — G35 Multiple sclerosis: Secondary | ICD-10-CM | POA: Diagnosis not present

## 2020-12-10 DIAGNOSIS — Z435 Encounter for attention to cystostomy: Secondary | ICD-10-CM | POA: Diagnosis not present

## 2020-12-10 DIAGNOSIS — I130 Hypertensive heart and chronic kidney disease with heart failure and stage 1 through stage 4 chronic kidney disease, or unspecified chronic kidney disease: Secondary | ICD-10-CM | POA: Diagnosis not present

## 2020-12-10 DIAGNOSIS — N319 Neuromuscular dysfunction of bladder, unspecified: Secondary | ICD-10-CM | POA: Diagnosis not present

## 2020-12-10 DIAGNOSIS — L89152 Pressure ulcer of sacral region, stage 2: Secondary | ICD-10-CM | POA: Diagnosis not present

## 2020-12-11 DIAGNOSIS — N184 Chronic kidney disease, stage 4 (severe): Secondary | ICD-10-CM | POA: Diagnosis not present

## 2020-12-11 DIAGNOSIS — M1990 Unspecified osteoarthritis, unspecified site: Secondary | ICD-10-CM | POA: Diagnosis not present

## 2020-12-11 DIAGNOSIS — K5900 Constipation, unspecified: Secondary | ICD-10-CM | POA: Diagnosis not present

## 2020-12-11 DIAGNOSIS — G35 Multiple sclerosis: Secondary | ICD-10-CM | POA: Diagnosis not present

## 2020-12-11 DIAGNOSIS — N390 Urinary tract infection, site not specified: Secondary | ICD-10-CM | POA: Diagnosis not present

## 2020-12-11 DIAGNOSIS — I5032 Chronic diastolic (congestive) heart failure: Secondary | ICD-10-CM | POA: Diagnosis not present

## 2020-12-11 DIAGNOSIS — I471 Supraventricular tachycardia: Secondary | ICD-10-CM | POA: Diagnosis not present

## 2020-12-11 DIAGNOSIS — G8929 Other chronic pain: Secondary | ICD-10-CM | POA: Diagnosis not present

## 2020-12-11 DIAGNOSIS — G4700 Insomnia, unspecified: Secondary | ICD-10-CM | POA: Diagnosis not present

## 2020-12-11 DIAGNOSIS — G4733 Obstructive sleep apnea (adult) (pediatric): Secondary | ICD-10-CM | POA: Diagnosis not present

## 2020-12-11 DIAGNOSIS — I130 Hypertensive heart and chronic kidney disease with heart failure and stage 1 through stage 4 chronic kidney disease, or unspecified chronic kidney disease: Secondary | ICD-10-CM | POA: Diagnosis not present

## 2020-12-11 DIAGNOSIS — N319 Neuromuscular dysfunction of bladder, unspecified: Secondary | ICD-10-CM | POA: Diagnosis not present

## 2020-12-11 DIAGNOSIS — Q603 Renal hypoplasia, unilateral: Secondary | ICD-10-CM | POA: Diagnosis not present

## 2020-12-11 DIAGNOSIS — R7303 Prediabetes: Secondary | ICD-10-CM | POA: Diagnosis not present

## 2020-12-11 DIAGNOSIS — I2510 Atherosclerotic heart disease of native coronary artery without angina pectoris: Secondary | ICD-10-CM | POA: Diagnosis not present

## 2020-12-11 DIAGNOSIS — I69354 Hemiplegia and hemiparesis following cerebral infarction affecting left non-dominant side: Secondary | ICD-10-CM | POA: Diagnosis not present

## 2020-12-11 DIAGNOSIS — D631 Anemia in chronic kidney disease: Secondary | ICD-10-CM | POA: Diagnosis not present

## 2020-12-11 DIAGNOSIS — I480 Paroxysmal atrial fibrillation: Secondary | ICD-10-CM | POA: Diagnosis not present

## 2020-12-11 DIAGNOSIS — R1310 Dysphagia, unspecified: Secondary | ICD-10-CM | POA: Diagnosis not present

## 2020-12-11 DIAGNOSIS — I959 Hypotension, unspecified: Secondary | ICD-10-CM | POA: Diagnosis not present

## 2020-12-11 DIAGNOSIS — Z9350 Unspecified cystostomy status: Secondary | ICD-10-CM | POA: Diagnosis not present

## 2020-12-11 DIAGNOSIS — K439 Ventral hernia without obstruction or gangrene: Secondary | ICD-10-CM | POA: Diagnosis not present

## 2020-12-11 DIAGNOSIS — E039 Hypothyroidism, unspecified: Secondary | ICD-10-CM | POA: Diagnosis not present

## 2020-12-11 DIAGNOSIS — M549 Dorsalgia, unspecified: Secondary | ICD-10-CM | POA: Diagnosis not present

## 2020-12-11 DIAGNOSIS — J8410 Pulmonary fibrosis, unspecified: Secondary | ICD-10-CM | POA: Diagnosis not present

## 2020-12-17 ENCOUNTER — Ambulatory Visit (INDEPENDENT_AMBULATORY_CARE_PROVIDER_SITE_OTHER): Payer: Medicare Other

## 2020-12-17 ENCOUNTER — Other Ambulatory Visit: Payer: Self-pay

## 2020-12-17 DIAGNOSIS — R339 Retention of urine, unspecified: Secondary | ICD-10-CM

## 2020-12-17 NOTE — Patient Instructions (Signed)
Foley Catheter Care and Patient Education  Perform catheter care every day.  You can do this while in the shower, but NOT while taking a tub bath.  You will need the following supplies: -mild soap, such as Dove -water -a clean washcloth (not one already used for bathing) or a 4x4 piece of gauze -1 Cath-Secure -night drainage bag -2 alcohol swabs  1. Was you hands thoroughly with soap and water 2. Using mild soap and water, clan your genital area a. Men should retract the foreskin, if needed, and clean the area, including the penis b. Women should separate the labia, and clean the area from front to back  3. Clean your urinary opening, which is where the catheter enters your body. 4. Clean the catheter from where it enters your body and then down, away from your body.  Hold the catheter at the point it enters your body so that you don't put tension on it. 5. Rinse the area well and dry it gently.  Changing the drainage bag You will change your drainage bag twice a day -in the morning after you shower, change he night bag to the leg bag -at night before you go to bed, change the leg bag to the night bag  1. Wash your hands thoroughly with soap and water 2. Empty the urine from the drainage bag into the toilet before you change it 3. Pinch off the catheter with your fingers and disconnect the used bag 4. Wipe the end of the catheter using an alcohol pad 5. Wipe the connector on the bag using the second alcohol pad 6. Connect the clean bag to the catheter and release your finger pinch 7. Check all connections.  Straighten any kinks or twits in the tubing  Caring for the Leg bag -always wear the leg bag below your knee.  This will help it drain. -keep the leg bag secure with the velcro straps.  If the straps leave a mark on your leg they are to tight and should be loosened.  Leaving the straps too tight can decrease you circulation and lead to blood clots. -empty the leg bag through  the spout at the bottom every 2-4 hours as needed.  Do not let the bag become completely full. -do not lie down for longer than 2 hours while you are wearing the leg bag.  Caring for the Night Bag -always keep the night bag below the level of your bladder . -to hang your night bag while you sleep, place a clean plastic bag inside of a wastebasket.  Hang the night bag on the inside of the wastebasket.  Cleaning the Drainage bag 1. Wash you hands thoroughly with soap and water. 2. Rinse the equipment with cool water.  Do not use hot water it can damage the plastic equipment. 3. Was the equipment with a mild liquid detergent (ivory) and rinse with cool water. 4. To decrease odor, fill the bag halfway with a mixture of 1 part vinegar and 3 parts water. Shake the bag and let it sit for 15 minutes. 5. Rinse the bag with cool water and hang it up to dry.  Preventing infection -keep the drainage bag below the level of your bladder and off the floor at all times. -keep the catheter secured to your thigh to prevent it from moving. -do not lie on or block the flow of urine in the tubing. -shower daily to keep the catheter clean. -clean your hands before and after touching   the catheter or bag. -the spout of the drainage bag should never touch the side of the toilet or any emptying container.  Special Points -You may see some blood or urine around where the catheter enters your body, especially when walking or having a bowel movement.  This is normal, as long as there is urine draining into the drainage bag.  If you experience significant leakage around  catheter tube where it enters your urethra possibly associated with lower abdominal cramping you could be having a bladder spasm.  Please notify your doctor and we can prescribe you a medication to improve your symptoms. -drink 1-2 glasses of liquids every 2 hours while you're awake.  Call your doctor immediately if -your catheter comes out, do not try  to replace it yourself -you have temperature of 101F (38.8C) or higher -you have decrease in the amount of urine you are making -you have foul-smelling urine -you have bright red blood or large blood clots in your urine -you have abdominal pain and no urine in your catheter bag   

## 2020-12-17 NOTE — Progress Notes (Signed)
Suprapubic Cath Change  Patient is present today for a suprapubic catheter change due to urinary retention.  67ml of water was drained from the balloon, a 24FR foley cath was removed from the tract with out difficulty.  Site was cleaned and prepped in a sterile fashion with betadine.  A 24FR foley cath was replaced into the tract no complications were noted. Urine return was noted, 10 ml of sterile water was inflated into the balloon and a bedside bag was attached for drainage.  Patient tolerated well. A leg bag was given to patient and proper instruction was given on how to switch bags.    Performed by: Quayshaun Hubbert, lpn   Follow up: Keep next scheduled [place]

## 2020-12-18 NOTE — Progress Notes (Signed)
Results sent via my chart 

## 2020-12-21 DIAGNOSIS — N184 Chronic kidney disease, stage 4 (severe): Secondary | ICD-10-CM | POA: Diagnosis not present

## 2020-12-21 DIAGNOSIS — G9349 Other encephalopathy: Secondary | ICD-10-CM | POA: Diagnosis not present

## 2020-12-21 DIAGNOSIS — I5032 Chronic diastolic (congestive) heart failure: Secondary | ICD-10-CM | POA: Diagnosis not present

## 2020-12-21 DIAGNOSIS — L89152 Pressure ulcer of sacral region, stage 2: Secondary | ICD-10-CM | POA: Diagnosis not present

## 2020-12-21 DIAGNOSIS — J1282 Pneumonia due to coronavirus disease 2019: Secondary | ICD-10-CM | POA: Diagnosis not present

## 2020-12-21 DIAGNOSIS — I130 Hypertensive heart and chronic kidney disease with heart failure and stage 1 through stage 4 chronic kidney disease, or unspecified chronic kidney disease: Secondary | ICD-10-CM | POA: Diagnosis not present

## 2020-12-21 DIAGNOSIS — Z435 Encounter for attention to cystostomy: Secondary | ICD-10-CM | POA: Diagnosis not present

## 2020-12-21 DIAGNOSIS — N390 Urinary tract infection, site not specified: Secondary | ICD-10-CM | POA: Diagnosis not present

## 2020-12-21 DIAGNOSIS — N179 Acute kidney failure, unspecified: Secondary | ICD-10-CM | POA: Diagnosis not present

## 2020-12-21 DIAGNOSIS — G35 Multiple sclerosis: Secondary | ICD-10-CM | POA: Diagnosis not present

## 2020-12-21 DIAGNOSIS — I69354 Hemiplegia and hemiparesis following cerebral infarction affecting left non-dominant side: Secondary | ICD-10-CM | POA: Diagnosis not present

## 2020-12-21 DIAGNOSIS — J9601 Acute respiratory failure with hypoxia: Secondary | ICD-10-CM | POA: Diagnosis not present

## 2020-12-21 DIAGNOSIS — N319 Neuromuscular dysfunction of bladder, unspecified: Secondary | ICD-10-CM | POA: Diagnosis not present

## 2020-12-21 DIAGNOSIS — Z8616 Personal history of COVID-19: Secondary | ICD-10-CM | POA: Diagnosis not present

## 2020-12-27 DIAGNOSIS — R945 Abnormal results of liver function studies: Secondary | ICD-10-CM | POA: Diagnosis not present

## 2020-12-27 DIAGNOSIS — I10 Essential (primary) hypertension: Secondary | ICD-10-CM | POA: Diagnosis not present

## 2020-12-27 DIAGNOSIS — G894 Chronic pain syndrome: Secondary | ICD-10-CM | POA: Diagnosis not present

## 2020-12-27 DIAGNOSIS — Z9981 Dependence on supplemental oxygen: Secondary | ICD-10-CM | POA: Diagnosis not present

## 2020-12-27 DIAGNOSIS — G35 Multiple sclerosis: Secondary | ICD-10-CM | POA: Diagnosis not present

## 2020-12-27 DIAGNOSIS — K8001 Calculus of gallbladder with acute cholecystitis with obstruction: Secondary | ICD-10-CM | POA: Diagnosis not present

## 2020-12-27 DIAGNOSIS — E039 Hypothyroidism, unspecified: Secondary | ICD-10-CM | POA: Diagnosis not present

## 2020-12-27 DIAGNOSIS — E876 Hypokalemia: Secondary | ICD-10-CM | POA: Diagnosis not present

## 2020-12-27 DIAGNOSIS — N1832 Chronic kidney disease, stage 3b: Secondary | ICD-10-CM | POA: Diagnosis not present

## 2020-12-27 DIAGNOSIS — N2889 Other specified disorders of kidney and ureter: Secondary | ICD-10-CM | POA: Diagnosis not present

## 2020-12-28 ENCOUNTER — Other Ambulatory Visit: Payer: Self-pay

## 2020-12-28 DIAGNOSIS — N390 Urinary tract infection, site not specified: Secondary | ICD-10-CM | POA: Diagnosis not present

## 2020-12-28 DIAGNOSIS — I130 Hypertensive heart and chronic kidney disease with heart failure and stage 1 through stage 4 chronic kidney disease, or unspecified chronic kidney disease: Secondary | ICD-10-CM | POA: Diagnosis not present

## 2020-12-28 DIAGNOSIS — I5032 Chronic diastolic (congestive) heart failure: Secondary | ICD-10-CM | POA: Diagnosis not present

## 2020-12-28 DIAGNOSIS — N184 Chronic kidney disease, stage 4 (severe): Secondary | ICD-10-CM | POA: Diagnosis not present

## 2020-12-28 DIAGNOSIS — G35 Multiple sclerosis: Secondary | ICD-10-CM | POA: Diagnosis not present

## 2020-12-28 DIAGNOSIS — N319 Neuromuscular dysfunction of bladder, unspecified: Secondary | ICD-10-CM | POA: Diagnosis not present

## 2020-12-31 ENCOUNTER — Telehealth: Payer: Self-pay | Admitting: Urology

## 2020-12-31 NOTE — Telephone Encounter (Signed)
Pt's wife called and wanted to let you know that Jordan Ward's kidney function went from 55-30. She wasn't sure if he needs to see a Nephrologist or needs to come back and see you. Please advise.

## 2021-01-03 NOTE — Telephone Encounter (Signed)
I called pt's wife--I recommend placing a  perc tube to help drain kidney better. She's OK w/ this but would like to wait til after [place]--they have an appt in < 2 weeks w/ us--will discuss  then.

## 2021-01-04 DIAGNOSIS — N184 Chronic kidney disease, stage 4 (severe): Secondary | ICD-10-CM | POA: Diagnosis not present

## 2021-01-04 DIAGNOSIS — G35 Multiple sclerosis: Secondary | ICD-10-CM | POA: Diagnosis not present

## 2021-01-04 DIAGNOSIS — I5032 Chronic diastolic (congestive) heart failure: Secondary | ICD-10-CM | POA: Diagnosis not present

## 2021-01-04 DIAGNOSIS — I130 Hypertensive heart and chronic kidney disease with heart failure and stage 1 through stage 4 chronic kidney disease, or unspecified chronic kidney disease: Secondary | ICD-10-CM | POA: Diagnosis not present

## 2021-01-04 DIAGNOSIS — N319 Neuromuscular dysfunction of bladder, unspecified: Secondary | ICD-10-CM | POA: Diagnosis not present

## 2021-01-04 DIAGNOSIS — N390 Urinary tract infection, site not specified: Secondary | ICD-10-CM | POA: Diagnosis not present

## 2021-01-10 DIAGNOSIS — Q603 Renal hypoplasia, unilateral: Secondary | ICD-10-CM | POA: Diagnosis not present

## 2021-01-10 DIAGNOSIS — M1990 Unspecified osteoarthritis, unspecified site: Secondary | ICD-10-CM | POA: Diagnosis not present

## 2021-01-10 DIAGNOSIS — I5032 Chronic diastolic (congestive) heart failure: Secondary | ICD-10-CM | POA: Diagnosis not present

## 2021-01-10 DIAGNOSIS — I130 Hypertensive heart and chronic kidney disease with heart failure and stage 1 through stage 4 chronic kidney disease, or unspecified chronic kidney disease: Secondary | ICD-10-CM | POA: Diagnosis not present

## 2021-01-10 DIAGNOSIS — K5900 Constipation, unspecified: Secondary | ICD-10-CM | POA: Diagnosis not present

## 2021-01-10 DIAGNOSIS — N184 Chronic kidney disease, stage 4 (severe): Secondary | ICD-10-CM | POA: Diagnosis not present

## 2021-01-10 DIAGNOSIS — D631 Anemia in chronic kidney disease: Secondary | ICD-10-CM | POA: Diagnosis not present

## 2021-01-10 DIAGNOSIS — R7303 Prediabetes: Secondary | ICD-10-CM | POA: Diagnosis not present

## 2021-01-10 DIAGNOSIS — G4733 Obstructive sleep apnea (adult) (pediatric): Secondary | ICD-10-CM | POA: Diagnosis not present

## 2021-01-10 DIAGNOSIS — J8410 Pulmonary fibrosis, unspecified: Secondary | ICD-10-CM | POA: Diagnosis not present

## 2021-01-10 DIAGNOSIS — N390 Urinary tract infection, site not specified: Secondary | ICD-10-CM | POA: Diagnosis not present

## 2021-01-10 DIAGNOSIS — I959 Hypotension, unspecified: Secondary | ICD-10-CM | POA: Diagnosis not present

## 2021-01-10 DIAGNOSIS — G4700 Insomnia, unspecified: Secondary | ICD-10-CM | POA: Diagnosis not present

## 2021-01-10 DIAGNOSIS — Z9350 Unspecified cystostomy status: Secondary | ICD-10-CM | POA: Diagnosis not present

## 2021-01-10 DIAGNOSIS — I480 Paroxysmal atrial fibrillation: Secondary | ICD-10-CM | POA: Diagnosis not present

## 2021-01-10 DIAGNOSIS — K439 Ventral hernia without obstruction or gangrene: Secondary | ICD-10-CM | POA: Diagnosis not present

## 2021-01-10 DIAGNOSIS — G35 Multiple sclerosis: Secondary | ICD-10-CM | POA: Diagnosis not present

## 2021-01-10 DIAGNOSIS — N319 Neuromuscular dysfunction of bladder, unspecified: Secondary | ICD-10-CM | POA: Diagnosis not present

## 2021-01-10 DIAGNOSIS — G8929 Other chronic pain: Secondary | ICD-10-CM | POA: Diagnosis not present

## 2021-01-10 DIAGNOSIS — R1310 Dysphagia, unspecified: Secondary | ICD-10-CM | POA: Diagnosis not present

## 2021-01-10 DIAGNOSIS — I471 Supraventricular tachycardia: Secondary | ICD-10-CM | POA: Diagnosis not present

## 2021-01-10 DIAGNOSIS — M549 Dorsalgia, unspecified: Secondary | ICD-10-CM | POA: Diagnosis not present

## 2021-01-10 DIAGNOSIS — I69354 Hemiplegia and hemiparesis following cerebral infarction affecting left non-dominant side: Secondary | ICD-10-CM | POA: Diagnosis not present

## 2021-01-10 DIAGNOSIS — I2510 Atherosclerotic heart disease of native coronary artery without angina pectoris: Secondary | ICD-10-CM | POA: Diagnosis not present

## 2021-01-10 DIAGNOSIS — E039 Hypothyroidism, unspecified: Secondary | ICD-10-CM | POA: Diagnosis not present

## 2021-01-15 ENCOUNTER — Ambulatory Visit (INDEPENDENT_AMBULATORY_CARE_PROVIDER_SITE_OTHER): Payer: Medicare Other

## 2021-01-15 ENCOUNTER — Other Ambulatory Visit: Payer: Self-pay | Admitting: Urology

## 2021-01-15 ENCOUNTER — Other Ambulatory Visit: Payer: Self-pay

## 2021-01-15 DIAGNOSIS — R339 Retention of urine, unspecified: Secondary | ICD-10-CM | POA: Diagnosis not present

## 2021-01-15 DIAGNOSIS — N1330 Unspecified hydronephrosis: Secondary | ICD-10-CM

## 2021-01-15 NOTE — Patient Instructions (Signed)
Foley Catheter Care and Patient Education  Perform catheter care every day.  You can do this while in the shower, but NOT while taking a tub bath.  You will need the following supplies: -mild soap, such as Dove -water -a clean washcloth (not one already used for bathing) or a 4x4 piece of gauze -1 Cath-Secure -night drainage bag -2 alcohol swabs  1. Was you hands thoroughly with soap and water 2. Using mild soap and water, clan your genital area a. Men should retract the foreskin, if needed, and clean the area, including the penis b. Women should separate the labia, and clean the area from front to back  3. Clean your urinary opening, which is where the catheter enters your body. 4. Clean the catheter from where it enters your body and then down, away from your body.  Hold the catheter at the point it enters your body so that you don't put tension on it. 5. Rinse the area well and dry it gently.  Changing the drainage bag You will change your drainage bag twice a day -in the morning after you shower, change he night bag to the leg bag -at night before you go to bed, change the leg bag to the night bag  1. Wash your hands thoroughly with soap and water 2. Empty the urine from the drainage bag into the toilet before you change it 3. Pinch off the catheter with your fingers and disconnect the used bag 4. Wipe the end of the catheter using an alcohol pad 5. Wipe the connector on the bag using the second alcohol pad 6. Connect the clean bag to the catheter and release your finger pinch 7. Check all connections.  Straighten any kinks or twits in the tubing  Caring for the Leg bag -always wear the leg bag below your knee.  This will help it drain. -keep the leg bag secure with the velcro straps.  If the straps leave a mark on your leg they are to tight and should be loosened.  Leaving the straps too tight can decrease you circulation and lead to blood clots. -empty the leg bag through  the spout at the bottom every 2-4 hours as needed.  Do not let the bag become completely full. -do not lie down for longer than 2 hours while you are wearing the leg bag.  Caring for the Night Bag -always keep the night bag below the level of your bladder . -to hang your night bag while you sleep, place a clean plastic bag inside of a wastebasket.  Hang the night bag on the inside of the wastebasket.  Cleaning the Drainage bag 1. Wash you hands thoroughly with soap and water. 2. Rinse the equipment with cool water.  Do not use hot water it can damage the plastic equipment. 3. Was the equipment with a mild liquid detergent (ivory) and rinse with cool water. 4. To decrease odor, fill the bag halfway with a mixture of 1 part vinegar and 3 parts water. Shake the bag and let it sit for 15 minutes. 5. Rinse the bag with cool water and hang it up to dry.  Preventing infection -keep the drainage bag below the level of your bladder and off the floor at all times. -keep the catheter secured to your thigh to prevent it from moving. -do not lie on or block the flow of urine in the tubing. -shower daily to keep the catheter clean. -clean your hands before and after touching   the catheter or bag. -the spout of the drainage bag should never touch the side of the toilet or any emptying container.  Special Points -You may see some blood or urine around where the catheter enters your body, especially when walking or having a bowel movement.  This is normal, as long as there is urine draining into the drainage bag.  If you experience significant leakage around  catheter tube where it enters your urethra possibly associated with lower abdominal cramping you could be having a bladder spasm.  Please notify your doctor and we can prescribe you a medication to improve your symptoms. -drink 1-2 glasses of liquids every 2 hours while you're awake.  Call your doctor immediately if -your catheter comes out, do not try  to replace it yourself -you have temperature of 101F (38.8C) or higher -you have decrease in the amount of urine you are making -you have foul-smelling urine -you have bright red blood or large blood clots in your urine -you have abdominal pain and no urine in your catheter bag   

## 2021-01-15 NOTE — Progress Notes (Signed)
Suprapubic Cath Change  Patient is present today for a suprapubic catheter change due to urinary retention.  40ml of water was drained from the balloon, a 24FR foley cath was removed from the tract with out difficulty.  Site was cleaned and prepped in a sterile fashion with betadine.  A 24FR foley cath was replaced into the tract no complications were noted. Urine return was noted, 10 ml of sterile water was inflated into the balloon and a leg bag was attached for drainage.  Patient tolerated well. A night bag was given to patient and proper instruction was given on how to switch bags.    Performed by: Peri Kreft, Lpn  Follow up: Keep next scheduled [place]

## 2021-01-17 ENCOUNTER — Telehealth: Payer: Self-pay

## 2021-01-18 DIAGNOSIS — N390 Urinary tract infection, site not specified: Secondary | ICD-10-CM | POA: Diagnosis not present

## 2021-01-18 DIAGNOSIS — N184 Chronic kidney disease, stage 4 (severe): Secondary | ICD-10-CM | POA: Diagnosis not present

## 2021-01-18 DIAGNOSIS — N319 Neuromuscular dysfunction of bladder, unspecified: Secondary | ICD-10-CM | POA: Diagnosis not present

## 2021-01-18 DIAGNOSIS — I5032 Chronic diastolic (congestive) heart failure: Secondary | ICD-10-CM | POA: Diagnosis not present

## 2021-01-18 DIAGNOSIS — I130 Hypertensive heart and chronic kidney disease with heart failure and stage 1 through stage 4 chronic kidney disease, or unspecified chronic kidney disease: Secondary | ICD-10-CM | POA: Diagnosis not present

## 2021-01-18 DIAGNOSIS — G35 Multiple sclerosis: Secondary | ICD-10-CM | POA: Diagnosis not present

## 2021-01-25 ENCOUNTER — Other Ambulatory Visit: Payer: Self-pay | Admitting: Student

## 2021-01-25 DIAGNOSIS — I130 Hypertensive heart and chronic kidney disease with heart failure and stage 1 through stage 4 chronic kidney disease, or unspecified chronic kidney disease: Secondary | ICD-10-CM | POA: Diagnosis not present

## 2021-01-25 DIAGNOSIS — I5032 Chronic diastolic (congestive) heart failure: Secondary | ICD-10-CM | POA: Diagnosis not present

## 2021-01-25 DIAGNOSIS — N390 Urinary tract infection, site not specified: Secondary | ICD-10-CM | POA: Diagnosis not present

## 2021-01-25 DIAGNOSIS — G35 Multiple sclerosis: Secondary | ICD-10-CM | POA: Diagnosis not present

## 2021-01-25 DIAGNOSIS — N319 Neuromuscular dysfunction of bladder, unspecified: Secondary | ICD-10-CM | POA: Diagnosis not present

## 2021-01-25 DIAGNOSIS — N184 Chronic kidney disease, stage 4 (severe): Secondary | ICD-10-CM | POA: Diagnosis not present

## 2021-01-25 NOTE — Progress Notes (Addendum)
Patient's wife Arville Go) called and states patient does not wish to have procedure on Monday (01/28/21). Stated she has called Darrel Reach at [facility] urology to cancel numerous times and left messages. I attempted to call interventional radiology on at 1625 today with no answer.

## 2021-01-28 ENCOUNTER — Ambulatory Visit (HOSPITAL_COMMUNITY): Payer: Medicare Other

## 2021-01-28 ENCOUNTER — Inpatient Hospital Stay (HOSPITAL_COMMUNITY)
Admission: RE | Admit: 2021-01-28 | Discharge: 2021-01-28 | Disposition: A | Discharge status: Home / Self Care | Payer: Medicare Other | Source: Ambulatory Visit | Attending: Urology | Admitting: Urology

## 2021-02-01 DIAGNOSIS — N184 Chronic kidney disease, stage 4 (severe): Secondary | ICD-10-CM | POA: Diagnosis not present

## 2021-02-01 DIAGNOSIS — I5032 Chronic diastolic (congestive) heart failure: Secondary | ICD-10-CM | POA: Diagnosis not present

## 2021-02-01 DIAGNOSIS — G35 Multiple sclerosis: Secondary | ICD-10-CM | POA: Diagnosis not present

## 2021-02-01 DIAGNOSIS — I130 Hypertensive heart and chronic kidney disease with heart failure and stage 1 through stage 4 chronic kidney disease, or unspecified chronic kidney disease: Secondary | ICD-10-CM | POA: Diagnosis not present

## 2021-02-01 DIAGNOSIS — N319 Neuromuscular dysfunction of bladder, unspecified: Secondary | ICD-10-CM | POA: Diagnosis not present

## 2021-02-01 DIAGNOSIS — N390 Urinary tract infection, site not specified: Secondary | ICD-10-CM | POA: Diagnosis not present

## 2021-02-06 NOTE — Telephone Encounter (Signed)
Can ya'll scheduled virtual visit please.

## 2021-02-08 DIAGNOSIS — N390 Urinary tract infection, site not specified: Secondary | ICD-10-CM | POA: Diagnosis not present

## 2021-02-08 DIAGNOSIS — I5032 Chronic diastolic (congestive) heart failure: Secondary | ICD-10-CM | POA: Diagnosis not present

## 2021-02-08 DIAGNOSIS — I130 Hypertensive heart and chronic kidney disease with heart failure and stage 1 through stage 4 chronic kidney disease, or unspecified chronic kidney disease: Secondary | ICD-10-CM | POA: Diagnosis not present

## 2021-02-08 DIAGNOSIS — G35 Multiple sclerosis: Secondary | ICD-10-CM | POA: Diagnosis not present

## 2021-02-08 DIAGNOSIS — N184 Chronic kidney disease, stage 4 (severe): Secondary | ICD-10-CM | POA: Diagnosis not present

## 2021-02-08 DIAGNOSIS — N319 Neuromuscular dysfunction of bladder, unspecified: Secondary | ICD-10-CM | POA: Diagnosis not present

## 2021-02-11 NOTE — Progress Notes (Signed)
Suprapubic Cath Change  Patient is present today for a suprapubic catheter change due to urinary retention.  68ml of water was drained from the balloon, a 24FR foley cath was removed from the tract with out difficulty.  Site was cleaned and prepped in a sterile fashion with betadine.  A 24FR foley cath was replaced into the tract no complications were noted. Urine return was noted, 10 ml of sterile water was inflated into the balloon and a leg bag was attached for drainage.  Patient tolerated well. A night bag was given to patient and proper instruction was given on how to switch bags.    Preformed by: Estill Bamberg RN  Follow up: 1 month [place]

## 2021-02-12 ENCOUNTER — Telehealth (INDEPENDENT_AMBULATORY_CARE_PROVIDER_SITE_OTHER): Payer: Medicare Other | Admitting: Urology

## 2021-02-12 ENCOUNTER — Ambulatory Visit (INDEPENDENT_AMBULATORY_CARE_PROVIDER_SITE_OTHER): Payer: Medicare Other

## 2021-02-12 ENCOUNTER — Other Ambulatory Visit: Payer: Self-pay

## 2021-02-12 DIAGNOSIS — N1330 Unspecified hydronephrosis: Secondary | ICD-10-CM | POA: Diagnosis not present

## 2021-02-12 DIAGNOSIS — R339 Retention of urine, unspecified: Secondary | ICD-10-CM

## 2021-02-12 NOTE — Progress Notes (Addendum)
I connected with  Jordan Ward on 02/12/21 by a video enabled telemedicine application and verified that I am speaking with the correct person using two identifiers.   I discussed the limitations of evaluation and management by telemedicine. The patient expressed understanding and agreed to proceed.    History of Present Illness: This meeting was by way of telehealth with the patient and his wife.  Shimshon has MS and has a suprapubic tube which he has had long-term.  He has recurrent urolithiasis necessitating urgent nephrostomy tube placement on the left with eventual delayed stone management after his infections, which almost always occur with these stones, are appropriately managed.  He has had sepsis secondary to these.  I recommended that he have a long-term nephrostomy tube placed.  Currently, he does have his suprapubic tube and a left ureteral stent that has been in some time.  This telehealth visit is to discuss that.  Past Medical History:  Diagnosis Date   Anemia    Arthritis    Back pain, chronic    Bilateral carotid bruits    C. difficile colitis 09/2011   CAD (coronary artery disease)    Carotid artery stenosis    Cerebrovascular disease    Cerebrovascular disease 08/17/2018   Colon polyps    Complication of cystostomy catheter, initial encounter ([place]) 04/20/2020   Dyslipidemia    Dysphagia 10/07/2011   Encephalopathy    Gait disorder    HA (headache)    High grade dysplasia in colonic adenoma 09/2005   History of kidney stones    HTN (hypertension), malignant 10/06/2011   Hypernatremia    Hypokalemia    Hypothyroidism 10/08/2011   Insomnia    Junctional rhythm    Kidney stones    MS (multiple sclerosis) (HCC)    Neuromuscular disorder (HCC)    MS   OSA (obstructive sleep apnea)    Paroxysmal atrial tachycardia ([place])    Peripheral vascular disease ([place])    Pneumonia 4 yrs ago   Pulmonary fibrosis ([place]) 10/06/2011   Pulmonary nodule 10/08/2011   PVD (peripheral  vascular disease) ([place])    Sacral ulcer (HCC)    Sleep apnea    cannot tolerate   Stroke ([place])    left sided weakness   Suprapubic catheter ([place])    TIA (transient ischemic attack)    Tremors of nervous system 10/08/2011   Urinary tract infection    Ventral hernia without obstruction or gangrene     Large 8X9cm ventral hernia with loss of domain. CT reads report as diastasis recti with herniation or diastasis recti.  Dr. Constance Haw, Surgery, reviewed CT with radiology and there is herniation with only hernia sac or peritoneum over the bowel and large separation of the rectus muslce (i.e. diastasis recti aka loss of domain).  No surgical intervention recommended given size, age, and health.     Past Surgical History:  Procedure Laterality Date   APPENDECTOMY  09/2005   at time of left hemicolectomy   BACK SURGERY  1976/1979   lower   BILIARY DILATION N/A 03/03/2020   Procedure: BILIARY DILATION;  Surgeon: Rogene Houston, MD;  [facility];  Service: Endoscopy;  Laterality: N/A;   CATARACT EXTRACTION W/PHACO Right 03/08/2018   Procedure: CATARACT EXTRACTION PHACO AND INTRAOCULAR LENS PLACEMENT RIGHT EYE;  Surgeon: Tonny Branch, MD;  Location: [facility];  Service: Ophthalmology;  Laterality: Right;  CDE: 8.86   CATARACT EXTRACTION W/PHACO Left 04/05/2018   Procedure: CATARACT EXTRACTION PHACO  AND INTRAOCULAR LENS PLACEMENT (IOC);  Surgeon: Tonny Branch, MD;  Location: [facility];  Service: Ophthalmology;  Laterality: Left;  CDE: 7.36   CENTRAL LINE INSERTION-TUNNELED Right 09/11/2020   Procedure: PLACEMENT OF TUNNELED CENTRAL LINE INTO JUGULAR VEIN;  Surgeon: Virl Cagey, MD;  Location: [facility];  Service: General;  Laterality: Right;   CHOLECYSTECTOMY     Dr. Tamala Julian   COLON SURGERY  09/2005   [place]: four tubular adenomas, large adenomatous polyp with HIGH GRADE dysplasia   COLONOSCOPY  11/2004   Dr. Sharol Roussel sessile polyp splenic flexure, 38mm sessile polyp desc colon,  tubulovillous adenoma (bx not removed)   COLONOSCOPY  01/2005   poor prep, polyp could not be found   COLONOSCOPY  05/2005   with EMR, polypectomy Dr. Olegario Messier, bx showed high grade dysplasia, partially resected   COLONOSCOPY  09/2005   Dr. Arsenio Loader, [place] ink tattooing, four villous colon polyp (3 had been missed on previous colonoscopies due to limitations of procedures   COLONOSCOPY  09/2006   normal TI, no polyps   COLONOSCOPY  10/2007   Dr. Imogene Burn distal mammillations, benign bx, normal TI, random bx neg for microscopic colitis   CYSTOSCOPY W/ URETERAL STENT PLACEMENT Left 09/05/2020   Procedure: CYSTOSCOPY WITH RETROGRADE PYELOGRAM/URETERAL STENT PLACEMENT;  Surgeon: Ardis Hughs, MD;  [facility];  Service: Urology;  Laterality: Left;   CYSTOSCOPY WITH LITHOLAPAXY N/A 07/27/2018   Procedure: CYSTOSCOPY WITH LITHOLAPAXY VIA  SUPRAPUBIC TUBE;  Surgeon: Franchot Gallo, MD;  Location: [facility];  Service: Urology;  Laterality: N/A;   CYSTOSCOPY WITH RETROGRADE PYELOGRAM, URETEROSCOPY AND STENT PLACEMENT Left 06/09/2017   Procedure: CYSTOSCOPY WITH LEFT RETROGRADE PYELOGRAM, LEFT URETEROSCOPY, LEFT URETEROSCOPIC STONE EXTRACTION, LEFT URETERAL STENT PLACEMENT;  Surgeon: Franchot Gallo, MD;  Location[facility];  Service: Urology;  Laterality: Left;   CYSTOSCOPY/URETEROSCOPY/HOLMIUM LASER/STENT PLACEMENT Left 05/22/2020   Procedure: CYSTOSCOPY/URETEROSCOPY/STENT PLACEMENT;  Surgeon: Franchot Gallo, MD;  [facility];  Service: Urology;  Laterality: Left;   ERCP N/A 03/03/2020   Procedure: ENDOSCOPIC RETROGRADE CHOLANGIOPANCREATOGRAPHY (ERCP);  Surgeon: Rogene Houston, MD;  [facility];  Service: Endoscopy;  Laterality: N/A;   ERCP N/A 04/20/2020   Procedure: ENDOSCOPIC RETROGRADE CHOLANGIOPANCREATOGRAPHY (ERCP);  Surgeon: Rogene Houston, MD;  [facility];  Service: Endoscopy;  Laterality: N/A;  to be done at 7:30am in [place] N/A 04/20/2020   Procedure: STENT REMOVAL;  Surgeon: Rogene Houston, MD;  [facility];  Service: Endoscopy;  Laterality: N/A;   INGUINAL HERNIA REPAIR  1971   bilateral   INSERTION OF SUPRAPUBIC CATHETER  06/09/2017   Procedure: EXCHANGE OF SUPRAPUBIC CATHETER;  Surgeon: Franchot Gallo, MD;  Location: [facility];  Service: Urology;;   INSERTION OF SUPRAPUBIC CATHETER  05/22/2020   Procedure: SUPRAPUBIC CATHETER EXCHANGE;  Surgeon: Franchot Gallo, MD;  Location: [facility];  Service: Urology;;   IR NEPHROSTOMY PLACEMENT LEFT  05/26/2017   IR NEPHROSTOMY PLACEMENT LEFT  05/02/2020   KIDNEY STONE SURGERY  09/13/2015   LITHOTRIPSY N/A 03/03/2020   Procedure: MECHANICAL LITHOTRIPSY WITH REMOVAL OF MULTIPLE STONE FRAGMENTS;  Surgeon: Rogene Houston, MD;  [facility];  Service: Endoscopy;  Laterality: N/A;   NEPHROLITHOTOMY Left 09/13/2015   Procedure: LEFT PERCUTANEOUS NEPHROLITHOTOMY ;  Surgeon: Franchot Gallo, MD;  [facility];  Service: Urology;  Laterality: Left;   NEPHROSTOMY TUBE REMOVAL  05/22/2020   Procedure: NEPHROSTOMY TUBE REMOVAL;  Surgeon: Franchot Gallo,  MD;  Location: [facility];  Service: Urology;;   [facility] REMOVAL Right 09/20/2020   Procedure: MINOR REMOVAL CENTRAL LINE;  Surgeon: Virl Cagey, MD;  Location: [facility];  Service: General;  Laterality: Right;  Pt to arrive at 7:30am for procedure   REMOVAL OF STONES N/A 04/20/2020   Procedure: REMOVAL OF STONES;  Surgeon: Rogene Houston, MD;  [facility];  Service: Endoscopy;  Laterality: N/A;   SPHINCTEROTOMY  03/03/2020   Procedure: BILLARY SPHINCTEROTOMY;  Surgeon: Rogene Houston, MD;  [facility];  Service: Endoscopy;;   SUPRAPUBIC CATHETER INSERTION      Home Medications:  Allergies as of 02/12/2021       Reactions   Tetracyclines & Related Anaphylaxis, Rash   Ciprofloxacin    Trouble swallowing unknown reaction according to wife          Medication List        Accurate as of Feb 12, 2021  5:02 PM. If you have any questions, ask your nurse or doctor.          cephALEXin 250 MG capsule Commonly known as: KEFLEX Take by mouth.   diazepam 5 MG tablet Commonly known as: VALIUM Take 1 tablet (5 mg total) by mouth every 12 (twelve) hours as needed for muscle spasms.   HYDROcodone-acetaminophen 7.5-325 MG tablet Commonly known as: NORCO Take 1 tablet by mouth 2 (two) times daily. Max APAP 3 GM IN 24 HOURS FROM ALL SOURCES What changed:  when to take this additional instructions   levothyroxine 75 MCG tablet Commonly known as: SYNTHROID TAKE ONE TABLET BY MOUTH ONCE DAILY.   sulfamethoxazole-trimethoprim 800-160 MG tablet Commonly known as: BACTRIM DS Take 1 tablet twice a day starting 2 days before catheter change.        Allergies:  Allergies  Allergen Reactions   Tetracyclines & Related Anaphylaxis and Rash   Ciprofloxacin     Trouble swallowing unknown reaction according to wife     Family History  Problem Relation Age of Onset   Cirrhosis Brother        etoh   Stroke Mother 28   Coronary artery disease Father 41   Heart attack Brother    Cancer Sister    Multiple sclerosis Other    Colon cancer Neg Hx     Social History:  reports that he quit smoking about 32 years ago. His smoking use included cigarettes. He has a 25.00 pack-year smoking history. He has never used smokeless tobacco. He reports that he does not drink alcohol and does not use drugs.  ROS: A complete review of systems was performed.  All systems are negative except for pertinent findings as noted.  I have reviewed prior pt notes  I have independently reviewed prior imaging--CT scan from March 4 was reviewed.    Impression/Assessment:  1.  Recurrent urolithiasis, on CT scan from March there was 1 lower pole stone present  2.  Hydronephrosis on the left, currently stent is in place.  I have been reticent to remove  stent due to sepsis after stent removals  Plan:  I discussed with the patient and his wife for over 15 minutes today the need for nephrostomy tube placement with eventual changes every 6 to 8 weeks.  I discussed with him the fact that nephrostomy tube changes may be done here in [place], but placement needs to be done in [place]  I discussed the advantages of long-term nephrostomy tube with him,  and that it might obviate urgent management of hydronephrosis and urinary tract infections  Multiple questions were answered.  He will get back with me within the next week or so about this.  I connected with  Lazlo Tunney Mikelson on 02/12/2021 by telephone and verified that I am speaking with the correct person using two identifiers.   I discussed the limitations of evaluation and management by telemedicine. The patient expressed understanding and agreed to proceed.  Pt seen in context of Covid 19 pandemic  Pt location:Home My location: [facility] [place] Persons on call: Pt/wife

## 2021-02-13 ENCOUNTER — Inpatient Hospital Stay (HOSPITAL_COMMUNITY): Payer: Medicare Other

## 2021-02-13 ENCOUNTER — Other Ambulatory Visit: Payer: Self-pay

## 2021-02-13 ENCOUNTER — Emergency Department (HOSPITAL_COMMUNITY): Payer: Medicare Other

## 2021-02-13 ENCOUNTER — Inpatient Hospital Stay (HOSPITAL_COMMUNITY)
Admission: EM | Admit: 2021-02-13 | Discharge: 2021-02-17 | DRG: 690 | Disposition: A | Discharge status: Home / Self Care | Payer: Medicare Other | Attending: Internal Medicine | Admitting: Internal Medicine

## 2021-02-13 ENCOUNTER — Encounter (HOSPITAL_COMMUNITY): Payer: Self-pay

## 2021-02-13 DIAGNOSIS — E870 Hyperosmolality and hypernatremia: Secondary | ICD-10-CM | POA: Diagnosis present

## 2021-02-13 DIAGNOSIS — E039 Hypothyroidism, unspecified: Secondary | ICD-10-CM | POA: Diagnosis present

## 2021-02-13 DIAGNOSIS — R251 Tremor, unspecified: Secondary | ICD-10-CM | POA: Diagnosis present

## 2021-02-13 DIAGNOSIS — I2510 Atherosclerotic heart disease of native coronary artery without angina pectoris: Secondary | ICD-10-CM | POA: Diagnosis present

## 2021-02-13 DIAGNOSIS — N189 Chronic kidney disease, unspecified: Secondary | ICD-10-CM | POA: Diagnosis not present

## 2021-02-13 DIAGNOSIS — N179 Acute kidney failure, unspecified: Secondary | ICD-10-CM | POA: Diagnosis present

## 2021-02-13 DIAGNOSIS — Z9841 Cataract extraction status, right eye: Secondary | ICD-10-CM

## 2021-02-13 DIAGNOSIS — Z20822 Contact with and (suspected) exposure to covid-19: Secondary | ICD-10-CM | POA: Diagnosis present

## 2021-02-13 DIAGNOSIS — N390 Urinary tract infection, site not specified: Secondary | ICD-10-CM | POA: Diagnosis not present

## 2021-02-13 DIAGNOSIS — J8410 Pulmonary fibrosis, unspecified: Secondary | ICD-10-CM | POA: Diagnosis present

## 2021-02-13 DIAGNOSIS — Z79890 Hormone replacement therapy (postmenopausal): Secondary | ICD-10-CM

## 2021-02-13 DIAGNOSIS — Z452 Encounter for adjustment and management of vascular access device: Secondary | ICD-10-CM | POA: Diagnosis not present

## 2021-02-13 DIAGNOSIS — I420 Dilated cardiomyopathy: Secondary | ICD-10-CM | POA: Diagnosis present

## 2021-02-13 DIAGNOSIS — Z823 Family history of stroke: Secondary | ICD-10-CM

## 2021-02-13 DIAGNOSIS — Z86010 Personal history of colonic polyps: Secondary | ICD-10-CM

## 2021-02-13 DIAGNOSIS — N1832 Chronic kidney disease, stage 3b: Secondary | ICD-10-CM | POA: Diagnosis present

## 2021-02-13 DIAGNOSIS — N1330 Unspecified hydronephrosis: Principal | ICD-10-CM

## 2021-02-13 DIAGNOSIS — Z87440 Personal history of urinary (tract) infections: Secondary | ICD-10-CM

## 2021-02-13 DIAGNOSIS — J9611 Chronic respiratory failure with hypoxia: Secondary | ICD-10-CM | POA: Diagnosis present

## 2021-02-13 DIAGNOSIS — L89151 Pressure ulcer of sacral region, stage 1: Secondary | ICD-10-CM | POA: Diagnosis present

## 2021-02-13 DIAGNOSIS — N136 Pyonephrosis: Secondary | ICD-10-CM | POA: Diagnosis not present

## 2021-02-13 DIAGNOSIS — Z87442 Personal history of urinary calculi: Secondary | ICD-10-CM

## 2021-02-13 DIAGNOSIS — Z888 Allergy status to other drugs, medicaments and biological substances status: Secondary | ICD-10-CM

## 2021-02-13 DIAGNOSIS — Z9049 Acquired absence of other specified parts of digestive tract: Secondary | ICD-10-CM

## 2021-02-13 DIAGNOSIS — G4700 Insomnia, unspecified: Secondary | ICD-10-CM | POA: Diagnosis present

## 2021-02-13 DIAGNOSIS — Z87891 Personal history of nicotine dependence: Secondary | ICD-10-CM

## 2021-02-13 DIAGNOSIS — I6529 Occlusion and stenosis of unspecified carotid artery: Secondary | ICD-10-CM | POA: Diagnosis not present

## 2021-02-13 DIAGNOSIS — Z79899 Other long term (current) drug therapy: Secondary | ICD-10-CM

## 2021-02-13 DIAGNOSIS — G319 Degenerative disease of nervous system, unspecified: Secondary | ICD-10-CM | POA: Diagnosis not present

## 2021-02-13 DIAGNOSIS — Z9981 Dependence on supplemental oxygen: Secondary | ICD-10-CM

## 2021-02-13 DIAGNOSIS — G8929 Other chronic pain: Secondary | ICD-10-CM | POA: Diagnosis present

## 2021-02-13 DIAGNOSIS — G4733 Obstructive sleep apnea (adult) (pediatric): Secondary | ICD-10-CM | POA: Diagnosis present

## 2021-02-13 DIAGNOSIS — N261 Atrophy of kidney (terminal): Secondary | ICD-10-CM | POA: Diagnosis not present

## 2021-02-13 DIAGNOSIS — I5032 Chronic diastolic (congestive) heart failure: Secondary | ICD-10-CM | POA: Diagnosis present

## 2021-02-13 DIAGNOSIS — Z961 Presence of intraocular lens: Secondary | ICD-10-CM | POA: Diagnosis present

## 2021-02-13 DIAGNOSIS — I130 Hypertensive heart and chronic kidney disease with heart failure and stage 1 through stage 4 chronic kidney disease, or unspecified chronic kidney disease: Secondary | ICD-10-CM | POA: Diagnosis present

## 2021-02-13 DIAGNOSIS — Z9359 Other cystostomy status: Secondary | ICD-10-CM | POA: Diagnosis not present

## 2021-02-13 DIAGNOSIS — E1122 Type 2 diabetes mellitus with diabetic chronic kidney disease: Secondary | ICD-10-CM | POA: Diagnosis present

## 2021-02-13 DIAGNOSIS — I639 Cerebral infarction, unspecified: Secondary | ICD-10-CM | POA: Diagnosis not present

## 2021-02-13 DIAGNOSIS — E119 Type 2 diabetes mellitus without complications: Secondary | ICD-10-CM | POA: Diagnosis not present

## 2021-02-13 DIAGNOSIS — K838 Other specified diseases of biliary tract: Secondary | ICD-10-CM | POA: Diagnosis not present

## 2021-02-13 DIAGNOSIS — J449 Chronic obstructive pulmonary disease, unspecified: Secondary | ICD-10-CM | POA: Diagnosis not present

## 2021-02-13 DIAGNOSIS — Z8616 Personal history of COVID-19: Secondary | ICD-10-CM

## 2021-02-13 DIAGNOSIS — Z8673 Personal history of transient ischemic attack (TIA), and cerebral infarction without residual deficits: Secondary | ICD-10-CM

## 2021-02-13 DIAGNOSIS — Z881 Allergy status to other antibiotic agents status: Secondary | ICD-10-CM

## 2021-02-13 DIAGNOSIS — N319 Neuromuscular dysfunction of bladder, unspecified: Secondary | ICD-10-CM | POA: Diagnosis present

## 2021-02-13 DIAGNOSIS — E871 Hypo-osmolality and hyponatremia: Secondary | ICD-10-CM | POA: Diagnosis not present

## 2021-02-13 DIAGNOSIS — Z8249 Family history of ischemic heart disease and other diseases of the circulatory system: Secondary | ICD-10-CM

## 2021-02-13 DIAGNOSIS — Z9842 Cataract extraction status, left eye: Secondary | ICD-10-CM

## 2021-02-13 DIAGNOSIS — L89891 Pressure ulcer of other site, stage 1: Secondary | ICD-10-CM | POA: Diagnosis present

## 2021-02-13 DIAGNOSIS — E1151 Type 2 diabetes mellitus with diabetic peripheral angiopathy without gangrene: Secondary | ICD-10-CM | POA: Diagnosis present

## 2021-02-13 DIAGNOSIS — G35D Multiple sclerosis, unspecified: Secondary | ICD-10-CM | POA: Diagnosis present

## 2021-02-13 DIAGNOSIS — I10 Essential (primary) hypertension: Secondary | ICD-10-CM | POA: Diagnosis present

## 2021-02-13 DIAGNOSIS — Z820 Family history of epilepsy and other diseases of the nervous system: Secondary | ICD-10-CM

## 2021-02-13 DIAGNOSIS — L8990 Pressure ulcer of unspecified site, unspecified stage: Secondary | ICD-10-CM | POA: Diagnosis present

## 2021-02-13 DIAGNOSIS — M549 Dorsalgia, unspecified: Secondary | ICD-10-CM | POA: Diagnosis present

## 2021-02-13 DIAGNOSIS — G35 Multiple sclerosis: Secondary | ICD-10-CM | POA: Diagnosis present

## 2021-02-13 DIAGNOSIS — N210 Calculus in bladder: Secondary | ICD-10-CM | POA: Diagnosis not present

## 2021-02-13 DIAGNOSIS — E785 Hyperlipidemia, unspecified: Secondary | ICD-10-CM | POA: Diagnosis present

## 2021-02-13 DIAGNOSIS — E876 Hypokalemia: Secondary | ICD-10-CM | POA: Diagnosis present

## 2021-02-13 DIAGNOSIS — N132 Hydronephrosis with renal and ureteral calculous obstruction: Secondary | ICD-10-CM | POA: Diagnosis not present

## 2021-02-13 LAB — URINALYSIS, ROUTINE W REFLEX MICROSCOPIC
Bilirubin Urine: NEGATIVE
Glucose, UA: 50 mg/dL — AB
Ketones, ur: NEGATIVE mg/dL
Nitrite: NEGATIVE
Protein, ur: 100 mg/dL — AB
Specific Gravity, Urine: 1.004 — ABNORMAL LOW (ref 1.005–1.030)
pH: 8 (ref 5.0–8.0)

## 2021-02-13 LAB — CBC WITH DIFFERENTIAL/PLATELET
Abs Immature Granulocytes: 0.02 10*3/uL (ref 0.00–0.07)
Basophils Absolute: 0 10*3/uL (ref 0.0–0.1)
Basophils Relative: 0 %
Eosinophils Absolute: 0.1 10*3/uL (ref 0.0–0.5)
Eosinophils Relative: 1 %
HCT: 41.6 % (ref 39.0–52.0)
Hemoglobin: 13.4 g/dL (ref 13.0–17.0)
Immature Granulocytes: 0 %
Lymphocytes Relative: 11 %
Lymphs Abs: 1.2 10*3/uL (ref 0.7–4.0)
MCH: 30.1 pg (ref 26.0–34.0)
MCHC: 32.2 g/dL (ref 30.0–36.0)
MCV: 93.5 fL (ref 80.0–100.0)
Monocytes Absolute: 0.6 10*3/uL (ref 0.1–1.0)
Monocytes Relative: 5 %
Neutro Abs: 8.9 10*3/uL — ABNORMAL HIGH (ref 1.7–7.7)
Neutrophils Relative %: 83 %
Platelets: 235 10*3/uL (ref 150–400)
RBC: 4.45 MIL/uL (ref 4.22–5.81)
RDW: 14.7 % (ref 11.5–15.5)
WBC: 10.8 10*3/uL — ABNORMAL HIGH (ref 4.0–10.5)
nRBC: 0 % (ref 0.0–0.2)

## 2021-02-13 LAB — COMPREHENSIVE METABOLIC PANEL
ALT: 12 U/L (ref 0–44)
AST: 21 U/L (ref 15–41)
Albumin: 4 g/dL (ref 3.5–5.0)
Alkaline Phosphatase: 115 U/L (ref 38–126)
Anion gap: 11 (ref 5–15)
BUN: 43 mg/dL — ABNORMAL HIGH (ref 8–23)
CO2: 27 mmol/L (ref 22–32)
Calcium: 9 mg/dL (ref 8.9–10.3)
Chloride: 100 mmol/L (ref 98–111)
Creatinine, Ser: 3.23 mg/dL — ABNORMAL HIGH (ref 0.61–1.24)
GFR, Estimated: 19 mL/min — ABNORMAL LOW (ref >60)
Glucose, Bld: 127 mg/dL — ABNORMAL HIGH (ref 70–99)
Potassium: 3 mmol/L — ABNORMAL LOW (ref 3.5–5.1)
Sodium: 138 mmol/L (ref 135–145)
Total Bilirubin: 0.6 mg/dL (ref 0.3–1.2)
Total Protein: 8.1 g/dL (ref 6.5–8.1)

## 2021-02-13 LAB — RESP PANEL BY RT-PCR (FLU A&B, COVID) ARPGX2
Influenza A by PCR: NEGATIVE
Influenza B by PCR: NEGATIVE
SARS Coronavirus 2 by RT PCR: NEGATIVE

## 2021-02-13 LAB — LACTIC ACID, PLASMA: Lactic Acid, Venous: 1.9 mmol/L (ref 0.5–1.9)

## 2021-02-13 MED ORDER — POTASSIUM CHLORIDE 20 MEQ PO PACK
40.0000 meq | PACK | Freq: Once | ORAL | Status: AC
  Administered 2021-02-14: 40 meq via ORAL
  Filled 2021-02-13: qty 2

## 2021-02-13 MED ORDER — HYDROMORPHONE HCL 1 MG/ML IJ SOLN
0.5000 mg | INTRAMUSCULAR | Status: DC | PRN
  Administered 2021-02-13 – 2021-02-17 (×10): 0.5 mg via INTRAVENOUS
  Filled 2021-02-13 (×8): qty 0.5
  Filled 2021-02-13: qty 1
  Filled 2021-02-13: qty 0.5

## 2021-02-13 MED ORDER — POTASSIUM CHLORIDE IN NACL 20-0.9 MEQ/L-% IV SOLN
INTRAVENOUS | Status: AC
  Filled 2021-02-13: qty 1000

## 2021-02-13 MED ORDER — PIPERACILLIN-TAZOBACTAM IN DEX 2-0.25 GM/50ML IV SOLN
2.2500 g | Freq: Four times a day (QID) | INTRAVENOUS | Status: DC
  Administered 2021-02-14 (×2): 2.25 g via INTRAVENOUS
  Filled 2021-02-13 (×5): qty 50

## 2021-02-13 MED ORDER — FENTANYL CITRATE (PF) 100 MCG/2ML IJ SOLN
50.0000 ug | Freq: Once | INTRAMUSCULAR | Status: AC
  Administered 2021-02-13: 50 ug via INTRAVENOUS
  Filled 2021-02-13: qty 2

## 2021-02-13 NOTE — ED Notes (Signed)
Pt to CT at this time.

## 2021-02-13 NOTE — ED Notes (Signed)
Report to [facility] 

## 2021-02-13 NOTE — Progress Notes (Signed)
Pharmacy Antibiotic Note  Jordan Ward is a 80 y.o. male admitted on 11/27/3141 with complicated UTI w/ h/o polymicrobial infections, awaiting nephrotomy tube placement.  Pharmacy has been consulted for Zosyn dosing.  Plan: Zosyn 2.25g IV Q6H.  Height: 5\' 5"  (165.1 cm) Weight: 56.7 kg (125 lb) IBW/kg (Calculated) : 61.5  Temp (24hrs), Avg:98.7 F (37.1 C), Min:98.3 F (36.8 C), Max:99.7 F (37.6 C)  Recent Labs  Lab 02/13/21 1837  WBC 10.8*  CREATININE 3.23*  LATICACIDVEN 1.9    Estimated Creatinine Clearance: 15.4 mL/min (A) (by C-G formula based on SCr of 3.23 mg/dL (H)).    Allergies  Allergen Reactions  . Tetracyclines & Related Anaphylaxis and Rash  . Ciprofloxacin     Trouble swallowing unknown reaction according to wife      Thank you for allowing pharmacy to be a part of this patient's care.  Wynona Neat, PharmD, BCPS  02/13/2021 11:15 PM

## 2021-02-13 NOTE — Progress Notes (Signed)
I had a long talk with this man yesterday.  We have been trying for weeks to have a percutaneous nephrostomy tube placed.  I would recommend, with his worsening renal function and appearance of his kidney on CT, that he be transferred to the medical service at [facility].  He will need urgent percutaneous nephrostomy tube placement, hopefully on Thursday the 19th.

## 2021-02-13 NOTE — ED Notes (Signed)
ED TO INPATIENT HANDOFF REPORT  ED Nurse Name and Phone #:  [phone]  S Name/Age/Gender Jordan Ward 80 y.o. male Room/Bed: APA14/APA14  Code Status   Code Status: Prior  Home/SNF/Other Home Patient oriented to: self, place, time and situation Is this baseline? Yes   Triage Complete: Triage complete  Chief Complaint Hydronephrosis, left [N13.30]  Triage Note Pt to er, pt states that he is scheduled to have a nephrostomy tube placed, states that he is here for flank pain and thinks that he might need the tube placed sooner.      Allergies Allergies  Allergen Reactions  . Tetracyclines & Related Anaphylaxis and Rash  . Ciprofloxacin     Trouble swallowing unknown reaction according to wife     Level of Care/Admitting Diagnosis ED Disposition    ED Disposition Condition [facility] Area: [place] [[id number]]  Level of Care: Telemetry [5]  Admit to tele based on following criteria: Other see comments  Comments: tachycardia  May admit patient to [facility] or [facility] if equivalent level of care is available:: No  Covid Evaluation: Asymptomatic Screening Protocol (No Symptoms)  Diagnosis: Hydronephrosis, left [676195]  Admitting Physician: Kara Pacer  Attending Physician: Bethena Roys [phone]  Estimated length of stay: past midnight tomorrow  Certification:: I certify this patient will need inpatient services for at least 2 midnights       B Medical/Surgery History Past Medical History:  Diagnosis Date  . Anemia   . Arthritis   . Back pain, chronic   . Bilateral carotid bruits   . C. difficile colitis 09/2011  . CAD (coronary artery disease)   . Carotid artery stenosis   . Cerebrovascular disease   . Cerebrovascular disease 08/17/2018  . Colon polyps   . Complication of cystostomy catheter, initial encounter ([place]) 04/20/2020  . Dyslipidemia   . Dysphagia 10/07/2011  . Encephalopathy   . Gait  disorder   . HA (headache)   . High grade dysplasia in colonic adenoma 09/2005  . History of kidney stones   . HTN (hypertension), malignant 10/06/2011  . Hypernatremia   . Hypokalemia   . Hypothyroidism 10/08/2011  . Insomnia   . Junctional rhythm   . Kidney stones   . MS (multiple sclerosis) ([place])   . Neuromuscular disorder (HCC)    MS  . OSA (obstructive sleep apnea)   . Paroxysmal atrial tachycardia ([place])   . Peripheral vascular disease ([place])   . Pneumonia 4 yrs ago  . Pulmonary fibrosis ([place]) 10/06/2011  . Pulmonary nodule 10/08/2011  . PVD (peripheral vascular disease) ([place])   . Sacral ulcer ([place])   . Sleep apnea    cannot tolerate  . Stroke [facility])    left sided weakness  . Suprapubic catheter ([place])   . TIA (transient ischemic attack)   . Tremors of nervous system 10/08/2011  . Urinary tract infection   . Ventral hernia without obstruction or gangrene     Large 8X9cm ventral hernia with loss of domain. CT reads report as diastasis recti with herniation or diastasis recti.  Dr. Constance Haw, Surgery, reviewed CT with radiology and there is herniation with only hernia sac or peritoneum over the bowel and large separation of the rectus muslce (i.e. diastasis recti aka loss of domain).  No surgical intervention recommended given size, age, and health.    Past Surgical History:  Procedure Laterality Date  . APPENDECTOMY  09/2005  at time of left hemicolectomy  . BACK SURGERY  1976/1979   lower  . BILIARY DILATION N/A 03/03/2020   Procedure: BILIARY DILATION;  Surgeon: Rogene Houston, MD;  Location: [facility] ENDO SUITE;  Service: Endoscopy;  Laterality: N/A;  . CATARACT EXTRACTION W/PHACO Right 03/08/2018   Procedure: CATARACT EXTRACTION PHACO AND INTRAOCULAR LENS PLACEMENT RIGHT EYE;  Surgeon: Tonny Branch, MD;  Location: [facility] ORS;  Service: Ophthalmology;  Laterality: Right;  CDE: 8.86  . CATARACT EXTRACTION W/PHACO Left 04/05/2018   Procedure: CATARACT EXTRACTION PHACO AND INTRAOCULAR LENS  PLACEMENT (IOC);  Surgeon: Tonny Branch, MD;  Location: [facility] ORS;  Service: Ophthalmology;  Laterality: Left;  CDE: 7.36  . CENTRAL LINE INSERTION-TUNNELED Right 09/11/2020   Procedure: PLACEMENT OF TUNNELED CENTRAL LINE INTO JUGULAR VEIN;  Surgeon: Virl Cagey, MD;  Location: [facility] ORS;  Service: General;  Laterality: Right;  . CHOLECYSTECTOMY     Dr. Tamala Julian  . COLON SURGERY  09/2005   [place]: four tubular adenomas, large adenomatous polyp with HIGH GRADE dysplasia  . COLONOSCOPY  11/2004   Dr. Sharol Roussel sessile polyp splenic flexure, 34mm sessile polyp desc colon, tubulovillous adenoma (bx not removed)  . COLONOSCOPY  01/2005   poor prep, polyp could not be found  . COLONOSCOPY  05/2005   with EMR, polypectomy Dr. Olegario Messier, bx showed high grade dysplasia, partially resected  . COLONOSCOPY  09/2005   Dr. Arsenio Loader, [place] ink tattooing, four villous colon polyp (3 had been missed on previous colonoscopies due to limitations of procedures  . COLONOSCOPY  09/2006   normal TI, no polyps  . COLONOSCOPY  10/2007   Dr. Imogene Burn distal mammillations, benign bx, normal TI, random bx neg for microscopic colitis  . CYSTOSCOPY W/ URETERAL STENT PLACEMENT Left 09/05/2020   Procedure: CYSTOSCOPY WITH RETROGRADE PYELOGRAM/URETERAL STENT PLACEMENT;  Surgeon: Ardis Hughs, MD;  Location: [facility] ORS;  Service: Urology;  Laterality: Left;  . CYSTOSCOPY WITH LITHOLAPAXY N/A 07/27/2018   Procedure: CYSTOSCOPY WITH LITHOLAPAXY VIA  SUPRAPUBIC TUBE;  Surgeon: Franchot Gallo, MD;  Location: [facility] ORS;  Service: Urology;  Laterality: N/A;  . CYSTOSCOPY WITH RETROGRADE PYELOGRAM, URETEROSCOPY AND STENT PLACEMENT Left 06/09/2017   Procedure: CYSTOSCOPY WITH LEFT RETROGRADE PYELOGRAM, LEFT URETEROSCOPY, LEFT URETEROSCOPIC STONE EXTRACTION, LEFT URETERAL STENT PLACEMENT;  Surgeon: Franchot Gallo, MD;  Location: [facility] ORS;  Service: Urology;  Laterality: Left;  . CYSTOSCOPY/URETEROSCOPY/HOLMIUM LASER/STENT  PLACEMENT Left 05/22/2020   Procedure: CYSTOSCOPY/URETEROSCOPY/STENT PLACEMENT;  Surgeon: Franchot Gallo, MD;  Location: [facility] ORS;  Service: Urology;  Laterality: Left;  . ERCP N/A 03/03/2020   Procedure: ENDOSCOPIC RETROGRADE CHOLANGIOPANCREATOGRAPHY (ERCP);  Surgeon: Rogene Houston, MD;  Location: [facility];  Service: Endoscopy;  Laterality: N/A;  . ERCP N/A 04/20/2020   Procedure: ENDOSCOPIC RETROGRADE CHOLANGIOPANCREATOGRAPHY (ERCP);  Surgeon: Rogene Houston, MD;  Location: [facility];  Service: Endoscopy;  Laterality: N/A;  to be done at 7:30am in OR  . GASTROINTESTINAL STENT REMOVAL N/A 04/20/2020   Procedure: STENT REMOVAL;  Surgeon: Rogene Houston, MD;  Location: [facility];  Service: Endoscopy;  Laterality: N/A;  . INGUINAL HERNIA REPAIR  1971   bilateral  . INSERTION OF SUPRAPUBIC CATHETER  06/09/2017   Procedure: EXCHANGE OF SUPRAPUBIC CATHETER;  Surgeon: Franchot Gallo, MD;  Location: [facility] ORS;  Service: Urology;;  . INSERTION OF SUPRAPUBIC CATHETER  05/22/2020   Procedure: SUPRAPUBIC CATHETER EXCHANGE;  Surgeon: Franchot Gallo, MD;  Location: [facility] ORS;  Service: Urology;;  . IR NEPHROSTOMY PLACEMENT LEFT  05/26/2017  .  IR NEPHROSTOMY PLACEMENT LEFT  05/02/2020  . KIDNEY STONE SURGERY  09/13/2015  . LITHOTRIPSY N/A 03/03/2020   Procedure: MECHANICAL LITHOTRIPSY WITH REMOVAL OF MULTIPLE STONE FRAGMENTS;  Surgeon: Rogene Houston, MD;  Location: [facility];  Service: Endoscopy;  Laterality: N/A;  . NEPHROLITHOTOMY Left 09/13/2015   Procedure: LEFT PERCUTANEOUS NEPHROLITHOTOMY ;  Surgeon: Franchot Gallo, MD;  Location: [facility] ORS;  Service: Urology;  Laterality: Left;  . NEPHROSTOMY TUBE REMOVAL  05/22/2020   Procedure: NEPHROSTOMY TUBE REMOVAL;  Surgeon: Franchot Gallo, MD;  Location: [facility] ORS;  Service: Urology;;  . PORT-A-CATH REMOVAL Right 09/20/2020   Procedure: MINOR REMOVAL CENTRAL LINE;  Surgeon: Virl Cagey, MD;  Location: [facility] ORS;  Service: General;   Laterality: Right;  Pt to arrive at 7:30am for procedure  . REMOVAL OF STONES N/A 04/20/2020   Procedure: REMOVAL OF STONES;  Surgeon: Rogene Houston, MD;  Location: [facility];  Service: Endoscopy;  Laterality: N/A;  . SPHINCTEROTOMY  03/03/2020   Procedure: BILLARY SPHINCTEROTOMY;  Surgeon: Rogene Houston, MD;  Location: [facility];  Service: Endoscopy;;  . SUPRAPUBIC CATHETER INSERTION       A IV Location/Drains/Wounds Patient Lines/Drains/Airways Status    Active Line/Drains/Airways    Name Placement date Placement time Site Days   Peripheral IV 09/05/20 Right Forearm 09/05/20  0241  Forearm  161   Peripheral IV 02/13/21 Left Wrist 02/13/21  2045  Wrist  less than 1   Nephrostomy Left 10 Fr. 05/03/20  0001  Left  286   Suprapubic Catheter Latex 24 Fr. 05/22/20  1306  Latex  267   Ureteral Drain/Stent Left ureter 6 Fr. 06/09/17  1236  Left ureter  1345   Ureteral Drain/Stent Left ureter 6 Fr. 05/22/20  1253  Left ureter  267   Ureteral Drain/Stent Left ureter 6 Fr. 09/05/20  0732  Left ureter  161   Airway 09/05/20  0720  -- 161   Incision (Closed) 07/27/18 Abdomen 07/27/18  1246  -- 932   Incision (Closed) 09/05/20 Other (Comment) 09/05/20  0809  -- 161   Incision (Closed) 09/11/20 Chest Right 09/11/20  1332  -- 155   Pressure Ulcer 08/03/15 Stage I -  Intact skin with non-blanchable redness of a localized area usually over a bony prominence. Tip of left great toe red 08/03/15  1425  -- 2021   Pressure Injury 05/26/17 Stage I -  Intact skin with non-blanchable redness of a localized area usually over a bony prominence.  calleous and non-blanchable redness to ankle 05/26/17  0157  -- 1359   Pressure Injury 05/03/20 Sacrum Mid Stage 1 -  Intact skin with non-blanchable redness of a localized area usually over a bony prominence. 05/03/20  1800  -- 286          Intake/Output Last 24 hours No intake or output data in the 24 hours ending 02/13/21 2235  Labs/Imaging Results  for orders placed or performed during the hospital encounter of 02/13/21 (from the past 48 hour(s))  Urinalysis, Routine w reflex microscopic Urine, Suprapubic     Status: Abnormal   Collection Time: 02/13/21  5:44 PM  Result Value Ref Range   Color, Urine STRAW (A) YELLOW   APPearance CLEAR CLEAR   Specific Gravity, Urine 1.004 (L) 1.005 - 1.030   pH 8.0 5.0 - 8.0   Glucose, UA 50 (A) NEGATIVE mg/dL   Hgb urine dipstick LARGE (A) NEGATIVE   Bilirubin Urine NEGATIVE NEGATIVE  Ketones, ur NEGATIVE NEGATIVE mg/dL   Protein, ur 100 (A) NEGATIVE mg/dL   Nitrite NEGATIVE NEGATIVE   Leukocytes,Ua LARGE (A) NEGATIVE   RBC / HPF 21-50 0 - 5 RBC/hpf   WBC, UA 21-50 0 - 5 WBC/hpf   Bacteria, UA RARE (A) NONE SEEN   WBC Clumps PRESENT    Budding Yeast PRESENT     Comment: Performed at [facility], [address] [place], [place] [address]  CBC with Differential     Status: Abnormal   Collection Time: 02/13/21  6:37 PM  Result Value Ref Range   WBC 10.8 (H) 4.0 - 10.5 K/uL   RBC 4.45 4.22 - 5.81 MIL/uL   Hemoglobin 13.4 13.0 - 17.0 g/dL   HCT 41.6 39.0 - 52.0 %   MCV 93.5 80.0 - 100.0 fL   MCH 30.1 26.0 - 34.0 pg   MCHC 32.2 30.0 - 36.0 g/dL   RDW 14.7 11.5 - 15.5 %   Platelets 235 150 - 400 K/uL   nRBC 0.0 0.0 - 0.2 %   Neutrophils Relative % 83 %   Neutro Abs 8.9 (H) 1.7 - 7.7 K/uL   Lymphocytes Relative 11 %   Lymphs Abs 1.2 0.7 - 4.0 K/uL   Monocytes Relative 5 %   Monocytes Absolute 0.6 0.1 - 1.0 K/uL   Eosinophils Relative 1 %   Eosinophils Absolute 0.1 0.0 - 0.5 K/uL   Basophils Relative 0 %   Basophils Absolute 0.0 0.0 - 0.1 K/uL   Immature Granulocytes 0 %   Abs Immature Granulocytes 0.02 0.00 - 0.07 K/uL    Comment: Performed at [facility], [address] [place], [place] [address]  Comprehensive metabolic panel     Status: Abnormal   Collection Time: 02/13/21  6:37 PM  Result Value Ref Range   Sodium 138 135 - 145 mmol/L   Potassium 3.0 (L) 3.5 - 5.1 mmol/L    Chloride 100 98 - 111 mmol/L   CO2 27 22 - 32 mmol/L   Glucose, Bld 127 (H) 70 - 99 mg/dL    Comment: Glucose reference range applies only to samples taken after fasting for at least 8 hours.   BUN 43 (H) 8 - 23 mg/dL   Creatinine, Ser 3.23 (H) 0.61 - 1.24 mg/dL   Calcium 9.0 8.9 - 10.3 mg/dL   Total Protein 8.1 6.5 - 8.1 g/dL   Albumin 4.0 3.5 - 5.0 g/dL   AST 21 15 - 41 U/L   ALT 12 0 - 44 U/L   Alkaline Phosphatase 115 38 - 126 U/L   Total Bilirubin 0.6 0.3 - 1.2 mg/dL   GFR, Estimated 19 (L) >60 mL/min    Comment: (NOTE) Calculated using the CKD-EPI Creatinine Equation (2021)    Anion gap 11 5 - 15    Comment: Performed at [facility], [address] [place], [place] [address]  Lactic acid, plasma     Status: None   Collection Time: 02/13/21  6:37 PM  Result Value Ref Range   Lactic Acid, Venous 1.9 0.5 - 1.9 mmol/L    Comment: Performed at [facility], [address] [place],  [address]  Blood culture (routine x 2)     Status: None (Preliminary result)   Collection Time: 02/13/21  6:37 PM   Specimen: Vein; Blood  Result Value Ref Range   Specimen Description BLOOD BLOOD LEFT ARM    Special Requests      Blood Culture results may  not be optimal due to an inadequate volume of blood received in culture bottles BOTTLES DRAWN AEROBIC AND ANAEROBIC Performed at [facility], [address] [place], [place] [address]    Culture PENDING    Report Status PENDING   Blood culture (routine x 2)     Status: None (Preliminary result)   Collection Time: 02/13/21  6:37 PM   Specimen: Vein; Blood  Result Value Ref Range   Specimen Description BLOOD BLOOD RIGHT ARM    Special Requests      Blood Culture adequate volume BOTTLES DRAWN AEROBIC AND ANAEROBIC Performed at [facility], [address], [place], [place] [address]    Culture PENDING    Report Status PENDING   Resp Panel by RT-PCR (Flu A&B, Covid) Nasopharyngeal Swab     Status: None   Collection Time: 02/13/21  8:29  PM   Specimen: Nasopharyngeal Swab; Nasopharyngeal(NP) swabs in vial transport medium  Result Value Ref Range   SARS Coronavirus 2 by RT PCR NEGATIVE NEGATIVE    Comment: (NOTE) SARS-CoV-2 target nucleic acids are NOT DETECTED.  The SARS-CoV-2 RNA is generally detectable in upper respiratory specimens during the acute phase of infection. The lowest concentration of SARS-CoV-2 viral copies this assay can detect is 138 copies/mL. A negative result does not preclude SARS-Cov-2 infection and should not be used as the sole basis for treatment or other patient management decisions. A negative result may occur with  improper specimen collection/handling, submission of specimen other than nasopharyngeal swab, presence of viral mutation(s) within the areas targeted by this assay, and inadequate number of viral copies(<138 copies/mL). A negative result must be combined with clinical observations, patient history, and epidemiological information. The expected result is Negative.  Fact Sheet for Patients:  EntrepreneurPulse.com.au  Fact Sheet for Healthcare Providers:  IncredibleEmployment.be  This test is no t yet approved or cleared by the [place] FDA and  has been authorized for detection and/or diagnosis of SARS-CoV-2 by FDA under an Emergency Use Authorization (EUA). This EUA will remain  in effect (meaning this test can be used) for the duration of the COVID-19 declaration under Section 564(b)(1) of the Act, 21 U.S.C.section 360bbb-3(b)(1), unless the authorization is terminated  or revoked sooner.       Influenza A by PCR NEGATIVE NEGATIVE   Influenza B by PCR NEGATIVE NEGATIVE    Comment: (NOTE) The Xpert Xpress SARS-CoV-2/FLU/RSV plus assay is intended as an aid in the diagnosis of influenza from Nasopharyngeal swab specimens and should not be used as a sole basis for treatment. Nasal washings and aspirates are unacceptable for Xpert  Xpress SARS-CoV-2/FLU/RSV testing.  Fact Sheet for Patients: EntrepreneurPulse.com.au  Fact Sheet for Healthcare Providers: IncredibleEmployment.be  This test is not yet approved or cleared by the [place] FDA and has been authorized for detection and/or diagnosis of SARS-CoV-2 by FDA under an Emergency Use Authorization (EUA). This EUA will remain in effect (meaning this test can be used) for the duration of the COVID-19 declaration under Section 564(b)(1) of the Act, 21 U.S.C. section 360bbb-3(b)(1), unless the authorization is terminated or revoked.  Performed at [facility], [address] [place],  [address]    CT Renal Stone Study  Result Date: 02/13/2021 CLINICAL DATA:  Left-sided flank pain EXAM: CT ABDOMEN AND PELVIS WITHOUT CONTRAST TECHNIQUE: Multidetector CT imaging of the abdomen and pelvis was performed following the standard protocol without IV contrast. COMPARISON:  CT 11/30/2020, 09/05/2020 FINDINGS: Lower chest: Lung bases demonstrate pulmonary fibrosis. No  acute consolidation or pleural effusion. Hepatobiliary: Status post cholecystectomy. Decreased pneumobilia. Dilated extrahepatic common bile duct measuring up to 15 mm. Pancreas: Unremarkable. No pancreatic ductal dilatation or surrounding inflammatory changes. Spleen: Normal in size without focal abnormality. Adrenals/Urinary Tract: Adrenal glands are normal. Atrophic right kidney with intrarenal vascular calcifications and nonobstructing stones measuring up to 5 mm. Probable small cysts in the right kidney. Severe left-sided hydronephrosis, worse when compared to most recent prior, now also with increased perinephric fat stranding. Left renal cysts. Ureteral stent with proximal pigtail in the renal pelvis and distal pigtail in the bladder. Marked hydroureter which is also increased. Multiple stone fragments in the urinary bladder. Suprapubic catheter is in place. Stone lower  pole left kidney measuring 13 mm. Stomach/Bowel: The stomach is nonenlarged. There is no dilated small bowel. No acute bowel Gebhard thickening. Postsurgical changes at the sigmoid colon. Vascular/Lymphatic: Advanced aortic atherosclerosis. No aneurysm. Tapered and narrowed appearance of the distal aorta and bifurcation of the iliac vessels. No suspicious nodes. Reproductive: Prostate is unremarkable. Other: Negative for free air or free fluid. Anterior abdominal Deihl laxity with bulging mesenteric fat and bowel. Musculoskeletal: No acute or suspicious osseous abnormality. IMPRESSION: 1. Severe left-sided hydronephrosis and hydroureter, worsened as compared with 11/30/2020, now with moderate left perinephric edema and soft tissue stranding. 2. There are bilateral intrarenal calculi. There are multiple small stones within the urinary bladder. 3. Atrophic right kidney 4. Pulmonary fibrosis at the lung bases. Electronically Signed   By: Donavan Foil M.D.   On: 02/13/2021 19:30    Pending Labs Unresulted Labs (From admission, onward)         None      Vitals/Pain Today's Vitals   02/13/21 2115 02/13/21 2130 02/13/21 2145 02/13/21 2200  BP:  (!) 159/89  (!) 186/88  Pulse: 83 85 79 84  Resp: 20 (!) 34 20 16  Temp:    99.7 F (37.6 C)  TempSrc:    Oral  SpO2: 91% (!) 89% 91% 92%  Weight:      Height:      PainSc:        Isolation Precautions Airborne and Contact precautions  Medications Medications  fentaNYL (SUBLIMAZE) injection 50 mcg (50 mcg Intravenous Given 02/13/21 2051)    Mobility walks Low fall risk   Focused Assessments    R Recommendations: See Admitting Provider Note  Report given to:   Additional Notes:

## 2021-02-13 NOTE — ED Triage Notes (Signed)
Pt to er, pt states that he is scheduled to have a nephrostomy tube placed, states that he is here for flank pain and thinks that he might need the tube placed sooner.

## 2021-02-13 NOTE — ED Provider Notes (Addendum)
[facility] [place] Provider Note   CSN: [id number] Arrival date & time: 02/13/21  1703     History Chief Complaint  Patient presents with  . Flank Pain    Jordan Ward is a 80 y.o. male.  HPI 80 year old male with a history of CAD, suprapubic catheter, MS, kidney stones, pulmonary fibrosis, hypertension, TIA, PVD presents to the ER with complaints of left flank pain.  He reports a suprapubic catheter, followed by urology, per chart review, Dr. [name].  Patient is a poor historian.  He reports that he was told that he will need a nephrostomy tube and thinks that he may need it.  He states he cannot get to [facility] and needs to be taken to [place] to have this done.  Per chart review, patient had video visit with Dr. [name] yesterday, who did recommend nephrostomy tube.  There was plans for follow-up again with Dr. [name] once the patient had made a decision for the nephrostomy tube.  However, patient presents here to the ER stating that he thinks he needs one soon.  He has a history of sepsis from frequent UTIs, reports that he has been having the "shakes today".  No known fevers.  No abdominal pain, nausea, vomiting.    Past Medical History:  Diagnosis Date  . Anemia   . Arthritis   . Back pain, chronic   . Bilateral carotid bruits   . C. difficile colitis 09/2011  . CAD (coronary artery disease)   . Carotid artery stenosis   . Cerebrovascular disease   . Cerebrovascular disease 08/17/2018  . Colon polyps   . Complication of cystostomy catheter, initial encounter ([place]) 04/20/2020  . Dyslipidemia   . Dysphagia 10/07/2011  . Encephalopathy   . Gait disorder   . HA (headache)   . High grade dysplasia in colonic adenoma 09/2005  . History of kidney stones   . HTN (hypertension), malignant 10/06/2011  . Hypernatremia   . Hypokalemia   . Hypothyroidism 10/08/2011  . Insomnia   . Junctional rhythm   . Kidney stones   . MS (multiple sclerosis) ([place])   .  Neuromuscular disorder (HCC)    MS  . OSA (obstructive sleep apnea)   . Paroxysmal atrial tachycardia ([place])   . Peripheral vascular disease ([place])   . Pneumonia 4 yrs ago  . Pulmonary fibrosis ([place]) 10/06/2011  . Pulmonary nodule 10/08/2011  . PVD (peripheral vascular disease) ([place])   . Sacral ulcer ([place])   . Sleep apnea    cannot tolerate  . Stroke [facility])    left sided weakness  . Suprapubic catheter ([place])   . TIA (transient ischemic attack)   . Tremors of nervous system 10/08/2011  . Urinary tract infection   . Ventral hernia without obstruction or gangrene     Large 8X9cm ventral hernia with loss of domain. CT reads report as diastasis recti with herniation or diastasis recti.  Dr. Constance Haw, Surgery, reviewed CT with radiology and there is herniation with only hernia sac or peritoneum over the bowel and large separation of the rectus muslce (i.e. diastasis recti aka loss of domain).  No surgical intervention recommended given size, age, and health.     Patient Active Problem List   Diagnosis Date Noted  . Hydronephrosis, left 02/13/2021  . Encounter for removal of vascular catheter 09/14/2020  . Acute respiratory failure with hypoxia ([place]) 07/12/2020  . Pneumonia due to COVID-19 virus 07/12/2020  . Encephalopathy due to COVID-19 virus 07/12/2020  .  Acute hypoxemic respiratory failure due to COVID-19 ([place]) 07/03/2020  . Calculus of ureter 05/15/2020  . Bacteremia due to Gram-negative bacteria 05/04/2020  . Acute lower UTI 05/04/2020  . Acute unilateral obstructive uropathy   . SIRS (systemic inflammatory response syndrome) ([place]) 05/03/2020  . Hydronephrosis 05/02/2020  . Complication of cystostomy catheter, subsequent encounter 04/20/2020  . Acute kidney injury superimposed on CKD ([place]) 03/02/2020  . Dehydration 03/02/2020  . Nausea and vomiting 03/02/2020  . Ventral hernia without obstruction or gangrene 03/01/2020  . Hypotension 03/01/2020  . Poor appetite 03/01/2020  . Urinary  tract infection 03/01/2020  . Detrusor areflexia 09/16/2019  . Cerebrovascular disease 08/17/2018  . Partial small bowel obstruction ([place]) 07/11/2017  . Choledocholithiasis 07/11/2017  . Complicated UTI (urinary tract infection)   . Polymicrobial septicemia ([place]) 06/14/2017  . Paroxysmal atrial tachycardia ([place]) 06/14/2017  . Hypophosphatemia 06/14/2017  . Pressure injury of skin 05/26/2017  . Hydronephrosis due to obstruction of ureter 05/26/2017  . Obstructive uropathy 05/25/2017  . CKD (chronic kidney disease), stage III ([place]) 08/23/2016  . Hydronephrosis of left kidney 08/23/2016  . Kidney stones 09/13/2015  . Essential hypertension   . Pressure ulcer 08/04/2015  . Chronic diastolic (congestive) heart failure ([place]) 08/04/2015  . Hydronephrosis with obstructing calculus 08/04/2015  . Occult blood positive stool 09/05/2014  . Rash and nonspecific skin eruption 08/25/2014  . Edema 08/19/2014  . Subacute confusional state 08/06/2014  . Dilated cardiomyopathy ([place]) 07/21/2014  . Sepsis ([place]) 07/17/2014  . Severe sepsis ([place]) 07/17/2014  . Headache 03/13/2014  . CVA (cerebral infarction) 03/13/2014  . Abnormality of gait 03/13/2014  . Tremors of nervous system 10/08/2011  . Hypothyroidism 10/08/2011  . Pulmonary nodule 10/08/2011  . Dysphagia 10/07/2011  . HTN (hypertension), malignant 10/06/2011  . Chronic suprapubic catheter ([place]) 10/06/2011  . Pulmonary fibrosis ([place]) 10/06/2011  . UTI (urinary tract infection) 10/03/2011  . PNA (pneumonia) 10/02/2011  . Junctional rhythm 10/02/2011  . Multiple sclerosis ([place]) 10/02/2011  . Chronic diarrhea 06/17/2011  . Hx of adenomatous colonic polyps 06/17/2011  . High grade dysplasia in colonic adenoma 09/29/2005    Past Surgical History:  Procedure Laterality Date  . APPENDECTOMY  09/2005   at time of left hemicolectomy  . BACK SURGERY  1976/1979   lower  . BILIARY DILATION N/A 03/03/2020   Procedure: BILIARY DILATION;  Surgeon:  Rogene Houston, MD;  [facility];  Service: Endoscopy;  Laterality: N/A;  . CATARACT EXTRACTION W/PHACO Right 03/08/2018   Procedure: CATARACT EXTRACTION PHACO AND INTRAOCULAR LENS PLACEMENT RIGHT EYE;  Surgeon: Tonny Branch, MD;  Location: [facility] ORS;  Service: Ophthalmology;  Laterality: Right;  CDE: 8.86  . CATARACT EXTRACTION W/PHACO Left 04/05/2018   Procedure: CATARACT EXTRACTION PHACO AND INTRAOCULAR LENS PLACEMENT (IOC);  Surgeon: Tonny Branch, MD;  Location: [facility] ORS;  Service: Ophthalmology;  Laterality: Left;  CDE: 7.36  . CENTRAL LINE INSERTION-TUNNELED Right 09/11/2020   Procedure: PLACEMENT OF TUNNELED CENTRAL LINE INTO JUGULAR VEIN;  Surgeon: Virl Cagey, MD;  Location: [facility] ORS;  Service: General;  Laterality: Right;  . CHOLECYSTECTOMY     Dr. Tamala Julian  . COLON SURGERY  09/2005   [place]man: four tubular adenomas, large adenomatous polyp with HIGH GRADE dysplasia  . COLONOSCOPY  11/2004   Dr. Sharol Roussel sessile polyp splenic flexure, 68mm sessile polyp desc colon, tubulovillous adenoma (bx not removed)  . COLONOSCOPY  01/2005   poor prep, polyp could not be found  . COLONOSCOPY  05/2005  with EMR, polypectomy Dr. Olegario Messier, bx showed high grade dysplasia, partially resected  . COLONOSCOPY  09/2005   Dr. Arsenio Loader, [place] ink tattooing, four villous colon polyp (3 had been missed on previous colonoscopies due to limitations of procedures  . COLONOSCOPY  09/2006   normal TI, no polyps  . COLONOSCOPY  10/2007   Dr. Imogene Burn distal mammillations, benign bx, normal TI, random bx neg for microscopic colitis  . CYSTOSCOPY W/ URETERAL STENT PLACEMENT Left 09/05/2020   Procedure: CYSTOSCOPY WITH RETROGRADE PYELOGRAM/URETERAL STENT PLACEMENT;  Surgeon: Ardis Hughs, MD;  Location: [facility];  Service: Urology;  Laterality: Left;  . CYSTOSCOPY WITH LITHOLAPAXY N/A 07/27/2018   Procedure: CYSTOSCOPY WITH LITHOLAPAXY VIA  SUPRAPUBIC TUBE;  Surgeon: Franchot Gallo, MD;   Location: [facility];  Service: Urology;  Laterality: N/A;  . CYSTOSCOPY WITH RETROGRADE PYELOGRAM, URETEROSCOPY AND STENT PLACEMENT Left 06/09/2017   Procedure: CYSTOSCOPY WITH LEFT RETROGRADE PYELOGRAM, LEFT URETEROSCOPY, LEFT URETEROSCOPIC STONE EXTRACTION, LEFT URETERAL STENT PLACEMENT;  Surgeon: Franchot Gallo, MD;  Location: [facility];  Service: Urology;  Laterality: Left;  . CYSTOSCOPY/URETEROSCOPY/HOLMIUM LASER/STENT PLACEMENT Left 05/22/2020   Procedure: CYSTOSCOPY/URETEROSCOPY/STENT PLACEMENT;  Surgeon: Franchot Gallo, MD;  Location: [facility];  Service: Urology;  Laterality: Left;  . ERCP N/A 03/03/2020   Procedure: ENDOSCOPIC RETROGRADE CHOLANGIOPANCREATOGRAPHY (ERCP);  Surgeon: Rogene Houston, MD;  [facility];  Service: Endoscopy;  Laterality: N/A;  . ERCP N/A 04/20/2020   Procedure: ENDOSCOPIC RETROGRADE CHOLANGIOPANCREATOGRAPHY (ERCP);  Surgeon: Rogene Houston, MD;  [facility];  Service: Endoscopy;  Laterality: N/A;  to be done at 7:30am in OR  . GASTROINTESTINAL STENT REMOVAL N/A 04/20/2020   Procedure: STENT REMOVAL;  Surgeon: Rogene Houston, MD;  [facility];  Service: Endoscopy;  Laterality: N/A;  . INGUINAL HERNIA REPAIR  1971   bilateral  . INSERTION OF SUPRAPUBIC CATHETER  06/09/2017   Procedure: EXCHANGE OF SUPRAPUBIC CATHETER;  Surgeon: Franchot Gallo, MD;  Location: [facility];  Service: Urology;;  . INSERTION OF SUPRAPUBIC CATHETER  05/22/2020   Procedure: SUPRAPUBIC CATHETER EXCHANGE;  Surgeon: Franchot Gallo, MD;  Location: [facility];  Service: Urology;;  . IR NEPHROSTOMY PLACEMENT LEFT  05/26/2017  . IR NEPHROSTOMY PLACEMENT LEFT  05/02/2020  . KIDNEY STONE SURGERY  09/13/2015  . LITHOTRIPSY N/A 03/03/2020   Procedure: MECHANICAL LITHOTRIPSY WITH REMOVAL OF MULTIPLE STONE FRAGMENTS;  Surgeon: Rogene Houston, MD;  Location[facility];  Service: Endoscopy;  Laterality: N/A;  . NEPHROLITHOTOMY Left 09/13/2015   Procedure: LEFT  PERCUTANEOUS NEPHROLITHOTOMY ;  Surgeon: Franchot Gallo, MD;  Location: [facility];  Service: Urology;  Laterality: Left;  . NEPHROSTOMY TUBE REMOVAL  05/22/2020   Procedure: NEPHROSTOMY TUBE REMOVAL;  Surgeon: Franchot Gallo, MD;  Location: [facility];  Service: Urology;;  . PORT-A-CATH REMOVAL Right 09/20/2020   Procedure: MINOR REMOVAL CENTRAL LINE;  Surgeon: Virl Cagey, MD;  Location: [facility];  Service: General;  Laterality: Right;  Pt to arrive at 7:30am for procedure  . REMOVAL OF STONES N/A 04/20/2020   Procedure: REMOVAL OF STONES;  Surgeon: Rogene Houston, MD;  [facility];  Service: Endoscopy;  Laterality: N/A;  . SPHINCTEROTOMY  03/03/2020   Procedure: BILLARY SPHINCTEROTOMY;  Surgeon: Rogene Houston, MD;  [facility];  Service: Endoscopy;;  . SUPRAPUBIC CATHETER INSERTION         Family History  Problem Relation Age of Onset  . Cirrhosis Brother  etoh  . Stroke Mother 38  . Coronary artery disease Father 4  . Heart attack Brother   . Cancer Sister   . Multiple sclerosis Other   . Colon cancer Neg Hx     Social History   Tobacco Use  . Smoking status: Former Smoker    Packs/day: 1.00    Years: 25.00    Pack years: 25.00    Types: Cigarettes    Quit date: 03/03/1988    Years since quitting: 32.9  . Smokeless tobacco: Never Used  Vaping Use  . Vaping Use: Never used  Substance Use Topics  . Alcohol use: No  . Drug use: No    Home Medications Prior to Admission medications   Medication Sig Start Date End Date Taking? Authorizing Provider  diazepam (VALIUM) 5 MG tablet Take 1 tablet (5 mg total) by mouth every 12 (twelve) hours as needed for muscle spasms. 11/20/20  Yes Suzzanne Cloud, NP  HYDROcodone-acetaminophen (NORCO) 7.5-325 MG per tablet Take 1 tablet by mouth 2 (two) times daily. Max APAP 3 GM IN 24 HOURS FROM ALL SOURCES Patient taking differently: Take 1 tablet by mouth in the morning and at bedtime. ALL MEDICATIONS  TO BE CRUSHED AND PLACED IN APPLESAUCE 08/22/14  Yes Pandey, Mahima, MD  levothyroxine (SYNTHROID) 88 MCG tablet Take 88 mcg by mouth daily. 12/31/20  Yes [provider]  OXYGEN Inhale 1.5 L into the lungs every evening.   Yes [provider]  sulfamethoxazole-trimethoprim (BACTRIM DS) 800-160 MG tablet Take 1 tablet twice a day starting 2 days before catheter change. Patient not taking: No sig reported 11/22/20   Franchot Gallo, MD  cephALEXin (KEFLEX) 250 MG capsule Take by mouth. Patient not taking: No sig reported 10/22/20   [provider]  levothyroxine (SYNTHROID) 75 MCG tablet TAKE ONE TABLET BY MOUTH ONCE DAILY. Patient not taking: No sig reported 11/05/20   Noreene Larsson, NP  meropenem (MERREM) 500 MG injection Inject into the vein. Patient not taking: No sig reported 09/18/20   [provider]    Allergies    Tetracyclines & related and Ciprofloxacin  Review of Systems   Review of Systems  Constitutional: Negative for chills and fever.  HENT: Negative for ear pain and sore throat.   Eyes: Negative for pain and visual disturbance.  Respiratory: Negative for cough and shortness of breath.   Cardiovascular: Negative for chest pain and palpitations.  Gastrointestinal: Negative for abdominal pain and vomiting.  Genitourinary: Positive for flank pain. Negative for dysuria and hematuria.  Musculoskeletal: Negative for arthralgias and back pain.  Skin: Negative for color change and rash.  Neurological: Negative for seizures and syncope.  All other systems reviewed and are negative.   Physical Exam Updated Vital Signs BP (!) 193/98 (BP Location: Right Arm)   Pulse 95   Temp 98.3 F (36.8 C) (Oral)   Resp 19   Ht 5\' 5"  (1.651 m)   Wt 56.7 kg   SpO2 98%   BMI 20.80 kg/m   Physical Exam Vitals and nursing note reviewed.  Constitutional:      Appearance: He is well-developed. He is ill-appearing (chronically ill appearing ).  HENT:      Head: Normocephalic and atraumatic.  Eyes:     Conjunctiva/sclera: Conjunctivae normal.  Cardiovascular:     Rate and Rhythm: Normal rate and regular rhythm.     Heart sounds: No murmur heard.   Pulmonary:     Effort: Pulmonary effort  is normal. No respiratory distress.     Breath sounds: Normal breath sounds.  Abdominal:     Palpations: Abdomen is soft.     Tenderness: There is no abdominal tenderness. There is right CVA tenderness and left CVA tenderness.  Musculoskeletal:     Cervical back: Neck supple.  Skin:    General: Skin is warm and dry.  Neurological:     Mental Status: He is alert.     ED Results / Procedures / Treatments   Labs (all labs ordered are listed, but only abnormal results are displayed) Labs Reviewed  CBC WITH DIFFERENTIAL/PLATELET - Abnormal; Notable for the following components:      Result Value   WBC 10.8 (*)    Neutro Abs 8.9 (*)    All other components within normal limits  COMPREHENSIVE METABOLIC PANEL - Abnormal; Notable for the following components:   Potassium 3.0 (*)    Glucose, Bld 127 (*)    BUN 43 (*)    Creatinine, Ser 3.23 (*)    GFR, Estimated 19 (*)    All other components within normal limits  URINALYSIS, ROUTINE W REFLEX MICROSCOPIC - Abnormal; Notable for the following components:   Color, Urine STRAW (*)    Specific Gravity, Urine 1.004 (*)    Glucose, UA 50 (*)    Hgb urine dipstick LARGE (*)    Protein, ur 100 (*)    Leukocytes,Ua LARGE (*)    Bacteria, UA RARE (*)    All other components within normal limits  CULTURE, BLOOD (ROUTINE X 2)  CULTURE, BLOOD (ROUTINE X 2)  RESP PANEL BY RT-PCR (FLU A&B, COVID) ARPGX2  LACTIC ACID, PLASMA    EKG None  Radiology CT Renal Stone Study  Result Date: 02/13/2021 CLINICAL DATA:  Left-sided flank pain EXAM: CT ABDOMEN AND PELVIS WITHOUT CONTRAST TECHNIQUE: Multidetector CT imaging of the abdomen and pelvis was performed following the standard protocol without IV contrast.  COMPARISON:  CT 11/30/2020, 09/05/2020 FINDINGS: Lower chest: Lung bases demonstrate pulmonary fibrosis. No acute consolidation or pleural effusion. Hepatobiliary: Status post cholecystectomy. Decreased pneumobilia. Dilated extrahepatic common bile duct measuring up to 15 mm. Pancreas: Unremarkable. No pancreatic ductal dilatation or surrounding inflammatory changes. Spleen: Normal in size without focal abnormality. Adrenals/Urinary Tract: Adrenal glands are normal. Atrophic right kidney with intrarenal vascular calcifications and nonobstructing stones measuring up to 5 mm. Probable small cysts in the right kidney. Severe left-sided hydronephrosis, worse when compared to most recent prior, now also with increased perinephric fat stranding. Left renal cysts. Ureteral stent with proximal pigtail in the renal pelvis and distal pigtail in the bladder. Marked hydroureter which is also increased. Multiple stone fragments in the urinary bladder. Suprapubic catheter is in place. Stone lower pole left kidney measuring 13 mm. Stomach/Bowel: The stomach is nonenlarged. There is no dilated small bowel. No acute bowel Oyer thickening. Postsurgical changes at the sigmoid colon. Vascular/Lymphatic: Advanced aortic atherosclerosis. No aneurysm. Tapered and narrowed appearance of the distal aorta and bifurcation of the iliac vessels. No suspicious nodes. Reproductive: Prostate is unremarkable. Other: Negative for free air or free fluid. Anterior abdominal [place] laxity with bulging mesenteric fat and bowel. Musculoskeletal: No acute or suspicious osseous abnormality. IMPRESSION: 1. Severe left-sided hydronephrosis and hydroureter, worsened as compared with 11/30/2020, now with moderate left perinephric edema and soft tissue stranding. 2. There are bilateral intrarenal calculi. There are multiple small stones within the urinary bladder. 3. Atrophic right kidney 4. Pulmonary fibrosis at the lung bases. Electronically Signed  By: Donavan Foil M.D.   On: 02/13/2021 19:30    Procedures Procedures   Medications Ordered in ED Medications  fentaNYL (SUBLIMAZE) injection 50 mcg (50 mcg Intravenous Given 02/13/21 2051)    ED Course  I have reviewed the triage vital signs and the nursing notes.  Pertinent labs & imaging results that were available during my care of the patient were reviewed by me and considered in my medical decision making (see chart for details).    MDM Rules/Calculators/A&P                         80 year old male presents to the ER with complaints of worsening left-sided flank pain.  Patient followed by Dr. [name] with urology.  Seen by video visit yesterday with recommendations for a left nephrostomy tube.  Patient prior history of sepsis with UTI.  On arrival, he is normotensive, afebrile, mildly tachycardic, however not tachypneic or hypoxic.  He is chronically ill-appearing.  Labs and imaging ordered, reviewed and interpreted by me.  CBC with a leukocytosis of 10.8, CMP with a mildly elevated creatinine of 3.23, elevated from prior but he does have a history of similar elevation.  Lactic acid normal.  UA with large amount of hemoglobin, leukocytes, rare bacteria.  Blood cultures pending.  I spoke with Dr. [name] who recommends repeat CT renal stone study.  IMPRESSION:  1. Severe left-sided hydronephrosis and hydroureter, worsened as  compared with 11/30/2020, now with moderate left perinephric edema  and soft tissue stranding.  2. There are bilateral intrarenal calculi. There are multiple small  stones within the urinary bladder.  3. Atrophic right kidney  4. Pulmonary fibrosis at the lung bases.     Discussed the overall work-up and findings of the CT renal study with Dr. [name].  No signs of sepsis.  He recommends admission to medicine, with transfer to [facility] long for nephrostomy tube.  Consulted Dr. Denton Brick who will admit the patient, Dr. [name] will see the patient tomorrow at  [facility].  Stable for admission.  Final Clinical Impression(s) / ED Diagnoses Final diagnoses:  Hydronephrosis of left kidney    Rx / DC Orders ED Discharge Orders    None       Lyndel Safe 02/13/21 2117    Daleen Bo, MD 02/15/21 0929    Jordan Balding, PA-C 03/02/21 1349    Daleen Bo, MD 03/06/21 1135

## 2021-02-13 NOTE — H&P (Signed)
History and Physical    [name] UTM:[id number] DOB: 05-22-1941 DOA: 02/13/2021  PCP: Valentino Nose, FNP   Patient coming from: Home  I have personally briefly reviewed patient's old medical records in [place]  Chief Complaint: Left flank pain, chills.  HPI: Jordan Ward is a 80 y.o. male with extensive past medical history which includes multiple sclerosis, suprapubic catheter, dilated cardiomyopathy with diastolic CHF, pulmonary fibrosis, CKD 3, gram-negative bacteremia, coronary artery disease, stroke, recurrent urolithiasis Spouse is at bedside and assist with the history.  Patient presented to the ED with complaints of left-sided flank pain, and chills that started today, stating that he needs a nephrostomy tube, but thinks he needs it sooner.   Patient follows with urologist Dr. [name], and had a televisit yesterday, and it was recommended that he get the nephrostomy tube, but patient had not made the decision to have the procedure done.  Patient spouse reports new intermittent stuttering of speech here in the ED at about 4:30 PM, he has tremors, but this is not new.  Patient spouse reports that his speech stuttering has happened both in the distant past.  Patient has never had recurrence of this.  Patient has a complicated urologic history which includes presence of a suprapubic catheter, left ureteric stent placement 09/06/2020, gram-negative sepsis secondary to polymicrobial organisms including Enterococcus faecalis, Pseudomonas and Morganella.  ED Course: Temp Max 99.7, heart rate 70s to 80s, respiratory rate 16-34, blood pressure systolic 568L to 275T.  O2 sats greater than 90% on room air.  Creatinine elevated 3.23 from last check 2.34.  WBC 10.8.  Potassium 3.  UA large leukocytes negative nitrite rare bacteria 21-50 WBC. Renal stone study showed severe left-sided hydronephrosis and hydroureter worsened as compared to 11/30/2020, now with moderate left perinephric  edema and soft tissue stranding.  EDP Dahl[place]t recommended admission to [facility], plans for stent placement tomorrow.  Review of Systems: As per HPI all other systems reviewed and negative.  Past Medical History:  Diagnosis Date  . Anemia   . Arthritis   . Back pain, chronic   . Bilateral carotid bruits   . C. difficile colitis 09/2011  . CAD (coronary artery disease)   . Carotid artery stenosis   . Cerebrovascular disease   . Cerebrovascular disease 08/17/2018  . Colon polyps   . Complication of cystostomy catheter, initial encounter ([place]) 04/20/2020  . Dyslipidemia   . Dysphagia 10/07/2011  . Encephalopathy   . Gait disorder   . HA (headache)   . High grade dysplasia in colonic adenoma 09/2005  . History of kidney stones   . HTN (hypertension), malignant 10/06/2011  . Hypernatremia   . Hypokalemia   . Hypothyroidism 10/08/2011  . Insomnia   . Junctional rhythm   . Kidney stones   . MS (multiple sclerosis) ([place])   . Neuromuscular disorder (HCC)    MS  . OSA (obstructive sleep apnea)   . Paroxysmal atrial tachycardia ([place])   . Peripheral vascular disease ([place])   . Pneumonia 4 yrs ago  . Pulmonary fibrosis ([place]) 10/06/2011  . Pulmonary nodule 10/08/2011  . PVD (peripheral vascular disease) ([place])   . Sacral ulcer ([place])   . Sleep apnea    cannot tolerate  . Stroke [facility])    left sided weakness  . Suprapubic catheter ([place])   . TIA (transient ischemic attack)   . Tremors of nervous system 10/08/2011  . Urinary tract infection   . Ventral hernia without  obstruction or gangrene     Large 8X9cm ventral hernia with loss of domain. CT reads report as diastasis recti with herniation or diastasis recti.  Dr. Constance Haw, Surgery, reviewed CT with radiology and there is herniation with only hernia sac or peritoneum over the bowel and large separation of the rectus muslce (i.e. diastasis recti aka loss of domain).  No surgical intervention recommended given size, age, and health.     Past  Surgical History:  Procedure Laterality Date  . APPENDECTOMY  09/2005   at time of left hemicolectomy  . BACK SURGERY  1976/1979   lower  . BILIARY DILATION N/A 03/03/2020   Procedure: BILIARY DILATION;  Surgeon: Rogene Houston, MD;  [facility];  Service: Endoscopy;  Laterality: N/A;  . CATARACT EXTRACTION W/PHACO Right 03/08/2018   Procedure: CATARACT EXTRACTION PHACO AND INTRAOCULAR LENS PLACEMENT RIGHT EYE;  Surgeon: Tonny Branch, MD;  Location: [facility] ORS;  Service: Ophthalmology;  Laterality: Right;  CDE: 8.86  . CATARACT EXTRACTION W/PHACO Left 04/05/2018   Procedure: CATARACT EXTRACTION PHACO AND INTRAOCULAR LENS PLACEMENT (IOC);  Surgeon: Tonny Branch, MD;  Location: [facility] ORS;  Service: Ophthalmology;  Laterality: Left;  CDE: 7.36  . CENTRAL LINE INSERTION-TUNNELED Right 09/11/2020   Procedure: PLACEMENT OF TUNNELED CENTRAL LINE INTO JUGULAR VEIN;  Surgeon: Virl Cagey, MD;  Location: [facility] ORS;  Service: General;  Laterality: Right;  . CHOLECYSTECTOMY     Dr. Tamala Julian  . COLON SURGERY  09/2005   [place]man: four tubular adenomas, large adenomatous polyp with HIGH GRADE dysplasia  . COLONOSCOPY  11/2004   Dr. Sharol Roussel sessile polyp splenic flexure, 55mm sessile polyp desc colon, tubulovillous adenoma (bx not removed)  . COLONOSCOPY  01/2005   poor prep, polyp could not be found  . COLONOSCOPY  05/2005   with EMR, polypectomy Dr. Olegario Messier, bx showed high grade dysplasia, partially resected  . COLONOSCOPY  09/2005   Dr. Arsenio Loader, [place] ink tattooing, four villous colon polyp (3 had been missed on previous colonoscopies due to limitations of procedures  . COLONOSCOPY  09/2006   normal TI, no polyps  . COLONOSCOPY  10/2007   Dr. Imogene Burn distal mammillations, benign bx, normal TI, random bx neg for microscopic colitis  . CYSTOSCOPY W/ URETERAL STENT PLACEMENT Left 09/05/2020   Procedure: CYSTOSCOPY WITH RETROGRADE PYELOGRAM/URETERAL STENT PLACEMENT;  Surgeon: Ardis Hughs, MD;  Location: [facility];  Service: Urology;  Laterality: Left;  . CYSTOSCOPY WITH LITHOLAPAXY N/A 07/27/2018   Procedure: CYSTOSCOPY WITH LITHOLAPAXY VIA  SUPRAPUBIC TUBE;  Surgeon: Franchot Gallo, MD;  Location: [facility];  Service: Urology;  Laterality: N/A;  . CYSTOSCOPY WITH RETROGRADE PYELOGRAM, URETEROSCOPY AND STENT PLACEMENT Left 06/09/2017   Procedure: CYSTOSCOPY WITH LEFT RETROGRADE PYELOGRAM, LEFT URETEROSCOPY, LEFT URETEROSCOPIC STONE EXTRACTION, LEFT URETERAL STENT PLACEMENT;  Surgeon: Franchot Gallo, MD;  Location: [facility];  Service: Urology;  Laterality: Left;  . CYSTOSCOPY/URETEROSCOPY/HOLMIUM LASER/STENT PLACEMENT Left 05/22/2020   Procedure: CYSTOSCOPY/URETEROSCOPY/STENT PLACEMENT;  Surgeon: Franchot Gallo, MD;  Location: [facility];  Service: Urology;  Laterality: Left;  . ERCP N/A 03/03/2020   Procedure: ENDOSCOPIC RETROGRADE CHOLANGIOPANCREATOGRAPHY (ERCP);  Surgeon: Rogene Houston, MD;  [facility];  Service: Endoscopy;  Laterality: N/A;  . ERCP N/A 04/20/2020   Procedure: ENDOSCOPIC RETROGRADE CHOLANGIOPANCREATOGRAPHY (ERCP);  Surgeon: Rogene Houston, MD;  [facility];  Service: Endoscopy;  Laterality: N/A;  to be done at 7:30am in OR  . GASTROINTESTINAL STENT REMOVAL N/A 04/20/2020   Procedure: Lavell Islam  REMOVAL;  Surgeon: Rogene Houston, MD;  [facility];  Service: Endoscopy;  Laterality: N/A;  . INGUINAL HERNIA REPAIR  1971   bilateral  . INSERTION OF SUPRAPUBIC CATHETER  06/09/2017   Procedure: EXCHANGE OF SUPRAPUBIC CATHETER;  Surgeon: Franchot Gallo, MD;  Location: [facility];  Service: Urology;;  . INSERTION OF SUPRAPUBIC CATHETER  05/22/2020   Procedure: SUPRAPUBIC CATHETER EXCHANGE;  Surgeon: Franchot Gallo, MD;  Location: [facility];  Service: Urology;;  . IR NEPHROSTOMY PLACEMENT LEFT  05/26/2017  . IR NEPHROSTOMY PLACEMENT LEFT  05/02/2020  . KIDNEY STONE SURGERY  09/13/2015  . LITHOTRIPSY N/A 03/03/2020   Procedure:  MECHANICAL LITHOTRIPSY WITH REMOVAL OF MULTIPLE STONE FRAGMENTS;  Surgeon: Rogene Houston, MD;  [facility];  Service: Endoscopy;  Laterality: N/A;  . NEPHROLITHOTOMY Left 09/13/2015   Procedure: LEFT PERCUTANEOUS NEPHROLITHOTOMY ;  Surgeon: Franchot Gallo, MD;  Location: [facility];  Service: Urology;  Laterality: Left;  . NEPHROSTOMY TUBE REMOVAL  05/22/2020   Procedure: NEPHROSTOMY TUBE REMOVAL;  Surgeon: Franchot Gallo, MD;  Location: [facility];  Service: Urology;;  . PORT-A-CATH REMOVAL Right 09/20/2020   Procedure: MINOR REMOVAL CENTRAL LINE;  Surgeon: Virl Cagey, MD;  Location: [facility];  Service: General;  Laterality: Right;  Pt to arrive at 7:30am for procedure  . REMOVAL OF STONES N/A 04/20/2020   Procedure: REMOVAL OF STONES;  Surgeon: Rogene Houston, MD;  [facility];  Service: Endoscopy;  Laterality: N/A;  . SPHINCTEROTOMY  03/03/2020   Procedure: BILLARY SPHINCTEROTOMY;  Surgeon: Rogene Houston, MD;  [facility];  Service: Endoscopy;;  . SUPRAPUBIC CATHETER INSERTION       reports that he quit smoking about 32 years ago. His smoking use included cigarettes. He has a 25.00 pack-year smoking history. He has never used smokeless tobacco. He reports that he does not drink alcohol and does not use drugs.  Allergies  Allergen Reactions  . Tetracyclines & Related Anaphylaxis and Rash  . Ciprofloxacin     Trouble swallowing unknown reaction according to wife     Family History  Problem Relation Age of Onset  . Cirrhosis Brother        etoh  . Stroke Mother 33  . Coronary artery disease Father 62  . Heart attack Brother   . Cancer Sister   . Multiple sclerosis Other   . Colon cancer Neg Hx     Prior to Admission medications   Medication Sig Start Date End Date Taking? Authorizing Provider  diazepam (VALIUM) 5 MG tablet Take 1 tablet (5 mg total) by mouth every 12 (twelve) hours as needed for muscle spasms. 11/20/20  Yes Suzzanne Cloud, NP  HYDROcodone-acetaminophen (NORCO) 7.5-325 MG per tablet Take 1 tablet by mouth 2 (two) times daily. Max APAP 3 GM IN 24 HOURS FROM ALL SOURCES Patient taking differently: Take 1 tablet by mouth in the morning and at bedtime. ALL MEDICATIONS TO BE CRUSHED AND PLACED IN APPLESAUCE 08/22/14  Yes Pandey, Mahima, MD  levothyroxine (SYNTHROID) 88 MCG tablet Take 88 mcg by mouth daily. 12/31/20  Yes [provider]  OXYGEN Inhale 1.5 L into the lungs every evening.   Yes [provider]  sulfamethoxazole-trimethoprim (BACTRIM DS) 800-160 MG tablet Take 1 tablet twice a day starting 2 days before catheter change. Patient not taking: No sig reported 11/22/20   Franchot Gallo, MD  cephALEXin (KEFLEX) 250 MG capsule Take by mouth. Patient not taking:  No sig reported 10/22/20   [provider]  levothyroxine (SYNTHROID) 75 MCG tablet TAKE ONE TABLET BY MOUTH ONCE DAILY. Patient not taking: No sig reported 11/05/20   Noreene Larsson, NP  meropenem (MERREM) 500 MG injection Inject into the vein. Patient not taking: No sig reported 09/18/20   [provider]    Physical Exam: Vitals:   02/13/21 1708 02/13/21 1713 02/13/21 2052  BP:  129/71 (!) 193/98  Pulse:  (!) 104 95  Resp:  20 19  Temp:  98.4 F (36.9 C) 98.3 F (36.8 C)  TempSrc:  Oral Oral  SpO2:  96% 98%  Weight: 56.7 kg    Height: 5\' 5"  (1.651 m)      Constitutional: NAD, calm, comfortable Vitals:   02/13/21 1708 02/13/21 1713 02/13/21 2052  BP:  129/71 (!) 193/98  Pulse:  (!) 104 95  Resp:  20 19  Temp:  98.4 F (36.9 C) 98.3 F (36.8 C)  TempSrc:  Oral Oral  SpO2:  96% 98%  Weight: 56.7 kg    Height: 5\' 5"  (1.651 m)     Eyes: PERRL, lids and conjunctivae normal ENMT: Mucous membranes are moist.  Neck: normal, supple, no masses, no thyromegaly Respiratory: clear to auscultation bilaterally, no wheezing, no crackles. Normal respiratory effort. No accessory muscle use.  Cardiovascular:  Regular rate and rhythm, no murmurs / rubs / gallops. No extremity edema. 2+ pedal pulses.  Abdomen: no tenderness, no masses palpated. No hepatosplenomegaly. Bowel sounds positive.  Musculoskeletal: no clubbing / cyanosis. No joint deformity upper and lower extremities. Good ROM, no contractures. Normal muscle tone.  Skin: no rashes, lesions, ulcers. No induration Neurologic: Intermittent stuttering of speech, in between speech appears normal, occasional tremors involving his upper extremities.  No facial asymmetry, sensation intact, 4+/5 strength in all extremities. Psychiatric: Normal judgment and insight. Alert and oriented x 3. Normal mood.   Labs on Admission: I have personally reviewed following labs and imaging studies  CBC: Recent Labs  Lab 02/13/21 1837  WBC 10.8*  NEUTROABS 8.9*  HGB 13.4  HCT 41.6  MCV 93.5  PLT 789   Basic Metabolic Panel: Recent Labs  Lab 02/13/21 1837  NA 138  K 3.0*  CL 100  CO2 27  GLUCOSE 127*  BUN 43*  CREATININE 3.23*  CALCIUM 9.0   Liver Function Tests: Recent Labs  Lab 02/13/21 1837  AST 21  ALT 12  ALKPHOS 115  BILITOT 0.6  PROT 8.1  ALBUMIN 4.0   Urine analysis:    Component Value Date/Time   COLORURINE STRAW (A) 02/13/2021 [place] 02/13/2021 1744   LABSPEC 1.004 (L) 02/13/2021 1744   PHURINE 8.0 02/13/2021 1744   GLUCOSEU 50 (A) 02/13/2021 1744   HGBUR LARGE (A) 02/13/2021 [place] 02/13/2021 [place] 02/13/2021 1744   PROTEINUR 100 (A) 02/13/2021 1744   UROBILINOGEN 0.2 07/17/2014 1215   NITRITE NEGATIVE 02/13/2021 1744   LEUKOCYTESUR LARGE (A) 02/13/2021 1744    Radiological Exams on Admission: CT Renal Stone Study  Result Date: 02/13/2021 CLINICAL DATA:  Left-sided flank pain EXAM: CT ABDOMEN AND PELVIS WITHOUT CONTRAST TECHNIQUE: Multidetector CT imaging of the abdomen and pelvis was performed following the standard protocol without IV contrast.  COMPARISON:  CT 11/30/2020, 09/05/2020 FINDINGS: Lower chest: Lung bases demonstrate pulmonary fibrosis. No acute consolidation or pleural effusion. Hepatobiliary: Status post cholecystectomy. Decreased pneumobilia. Dilated extrahepatic common bile duct measuring up to 15 mm.  Pancreas: Unremarkable. No pancreatic ductal dilatation or surrounding inflammatory changes. Spleen: Normal in size without focal abnormality. Adrenals/Urinary Tract: Adrenal glands are normal. Atrophic right kidney with intrarenal vascular calcifications and nonobstructing stones measuring up to 5 mm. Probable small cysts in the right kidney. Severe left-sided hydronephrosis, worse when compared to most recent prior, now also with increased perinephric fat stranding. Left renal cysts. Ureteral stent with proximal pigtail in the renal pelvis and distal pigtail in the bladder. Marked hydroureter which is also increased. Multiple stone fragments in the urinary bladder. Suprapubic catheter is in place. Stone lower pole left kidney measuring 13 mm. Stomach/Bowel: The stomach is nonenlarged. There is no dilated small bowel. No acute bowel Koppel thickening. Postsurgical changes at the sigmoid colon. Vascular/Lymphatic: Advanced aortic atherosclerosis. No aneurysm. Tapered and narrowed appearance of the distal aorta and bifurcation of the iliac vessels. No suspicious nodes. Reproductive: Prostate is unremarkable. Other: Negative for free air or free fluid. Anterior abdominal Jurado laxity with bulging mesenteric fat and bowel. Musculoskeletal: No acute or suspicious osseous abnormality. IMPRESSION: 1. Severe left-sided hydronephrosis and hydroureter, worsened as compared with 11/30/2020, now with moderate left perinephric edema and soft tissue stranding. 2. There are bilateral intrarenal calculi. There are multiple small stones within the urinary bladder. 3. Atrophic right kidney 4. Pulmonary fibrosis at the lung bases. Electronically Signed   By: Donavan Foil M.D.   On: 02/13/2021 19:30    EKG: None.  Assessment/Plan Principal Problem:   Hydronephrosis, left Active Problems:   Multiple sclerosis (HCC)   Chronic suprapubic catheter (HCC)   Pulmonary fibrosis (HCC)   Tremors of nervous system   Hypothyroidism   Dilated cardiomyopathy (HCC)   Chronic diastolic (congestive) heart failure (HCC)   Essential hypertension   Acute kidney injury superimposed on CKD ([place])   Hydronephrosis left- renal stone study - severe left-sided hydronephrosis and hydroureter, worsened as compared with 11/30/2020, now with moderate left perinephric edema and soft tissue stranding.   -EDP talked to Dr. [name], who will see in the morning, plans for stent placement tomorrow,  -N.p.o. midnight -IV Dilaudid 0.5 mg as needed  AKI on CKD-creatinine 3.23, last creatinine checked 2.34 after recent hospitalization 08/2020. -Gentle hydration with N/s + 20 KCL 75cc/hr x 15hrs  Possible UTI, suprapubic catheter status- T-max 99.7.  WBC 10.8.  History of polymicrobial infection with Pseudomonas, Enterococcus faecalis and Morganella, completed course with Zosyn.  UA showing large leukocytes, negative nitrite rare bacteria 21-50 WBCs.  CT shows perinephric edema and stranding.  Rules out for sepsis. -Obtain urine culture -IV Zosyn  Multiple sclerosis-Baseline tremors.  New onset intermittent speech stuttering 9hrs ago in the ed, in-between stuttering speech is normal.. No focal neurologic deficits. - Resume Valium - Head CT  Chronic respiratory failure on 1.5 L nightly.  Hypokalemia potassium 3 - Replete - Check magnesium  Diabetes mellitus-random glucose 127. - SSI-s q6h  - HgbA1c  Dilated cardiomyopathy-last echo 08/2020 shows EF of 50 to 55% with grade 1 diastolic relaxation. - Gentle hydration  Hypothyroism -Resume Synthroid  COPD, pulmonary fibrosis-stable -As needed bronchodilators   DVT prophylaxis: SCDs for now, pending urology  eval Code Status: Full code Family Communication: Spouse at bedside Disposition Plan: ~ 2 days Consults called: Urology Admission status: Inpt, tele I certify that at the point of admission it is my clinical judgment that the patient will require inpatient hospital care spanning beyond 2 midnights from the point of admission due to high intensity of service, high risk for  further deterioration and high frequency of surveillance required. The following factors support the patient status of inpatient:    Bethena Roys MD [facility]  02/13/2021, 11:14 PM

## 2021-02-14 DIAGNOSIS — N1330 Unspecified hydronephrosis: Secondary | ICD-10-CM

## 2021-02-14 LAB — GLUCOSE, CAPILLARY
Glucose-Capillary: 122 mg/dL — ABNORMAL HIGH (ref 70–99)
Glucose-Capillary: 86 mg/dL (ref 70–99)
Glucose-Capillary: 74 mg/dL (ref 70–99)
Glucose-Capillary: 90 mg/dL (ref 70–99)
Glucose-Capillary: 137 mg/dL — ABNORMAL HIGH (ref 70–99)

## 2021-02-14 LAB — CBC
HCT: 42.6 % (ref 39.0–52.0)
Hemoglobin: 13.7 g/dL (ref 13.0–17.0)
MCH: 29.7 pg (ref 26.0–34.0)
MCHC: 32.2 g/dL (ref 30.0–36.0)
MCV: 92.2 fL (ref 80.0–100.0)
Platelets: 186 10*3/uL (ref 150–400)
RBC: 4.62 MIL/uL (ref 4.22–5.81)
RDW: 14.8 % (ref 11.5–15.5)
WBC: 14.8 10*3/uL — ABNORMAL HIGH (ref 4.0–10.5)
nRBC: 0 % (ref 0.0–0.2)

## 2021-02-14 LAB — BASIC METABOLIC PANEL
Anion gap: 9 (ref 5–15)
BUN: 44 mg/dL — ABNORMAL HIGH (ref 8–23)
CO2: 26 mmol/L (ref 22–32)
Calcium: 9.4 mg/dL (ref 8.9–10.3)
Chloride: 110 mmol/L (ref 98–111)
Creatinine, Ser: 3.33 mg/dL — ABNORMAL HIGH (ref 0.61–1.24)
GFR, Estimated: 18 mL/min — ABNORMAL LOW (ref >60)
Glucose, Bld: 100 mg/dL — ABNORMAL HIGH (ref 70–99)
Potassium: 3.8 mmol/L (ref 3.5–5.1)
Sodium: 145 mmol/L (ref 135–145)

## 2021-02-14 LAB — HEMOGLOBIN A1C
Hgb A1c MFr Bld: 5.9 % — ABNORMAL HIGH (ref 4.8–5.6)
Mean Plasma Glucose: 122.63 mg/dL

## 2021-02-14 LAB — MAGNESIUM: Magnesium: 2.1 mg/dL (ref 1.7–2.4)

## 2021-02-14 MED ORDER — POLYETHYLENE GLYCOL 3350 17 G PO PACK: 17.0000 g | PACK | Freq: Every day | ORAL | Status: DC | PRN

## 2021-02-14 MED ORDER — ONDANSETRON HCL 4 MG/2ML IJ SOLN: 4.0000 mg | Freq: Four times a day (QID) | INTRAMUSCULAR | Status: DC | PRN

## 2021-02-14 MED ORDER — INSULIN ASPART 100 UNIT/ML IJ SOLN
0.0000 [IU] | Freq: Four times a day (QID) | INTRAMUSCULAR | Status: DC
  Administered 2021-02-14 – 2021-02-15 (×2): 1 [IU] via SUBCUTANEOUS

## 2021-02-14 MED ORDER — ORAL CARE MOUTH RINSE
15.0000 mL | Freq: Two times a day (BID) | OROMUCOSAL | Status: DC
  Administered 2021-02-14 – 2021-02-17 (×7): 15 mL via OROMUCOSAL

## 2021-02-14 MED ORDER — LEVOTHYROXINE SODIUM 88 MCG PO TABS
88.0000 ug | ORAL_TABLET | Freq: Every day | ORAL | Status: DC
  Administered 2021-02-16 – 2021-02-17 (×2): 88 ug via ORAL
  Filled 2021-02-14 (×3): qty 1

## 2021-02-14 MED ORDER — PIPERACILLIN-TAZOBACTAM IN DEX 2-0.25 GM/50ML IV SOLN
2.2500 g | Freq: Four times a day (QID) | INTRAVENOUS | Status: DC
  Administered 2021-02-14 – 2021-02-16 (×7): 2.25 g via INTRAVENOUS
  Filled 2021-02-14 (×8): qty 50

## 2021-02-14 MED ORDER — ACETAMINOPHEN 325 MG PO TABS
650.0000 mg | ORAL_TABLET | Freq: Four times a day (QID) | ORAL | Status: DC | PRN
  Filled 2021-02-14: qty 2

## 2021-02-14 MED ORDER — DIAZEPAM 5 MG PO TABS
5.0000 mg | ORAL_TABLET | Freq: Two times a day (BID) | ORAL | Status: DC | PRN
  Administered 2021-02-14 – 2021-02-16 (×4): 5 mg via ORAL
  Filled 2021-02-14 (×4): qty 1

## 2021-02-14 MED ORDER — ONDANSETRON HCL 4 MG PO TABS: 4.0000 mg | ORAL_TABLET | Freq: Four times a day (QID) | ORAL | Status: DC | PRN

## 2021-02-14 MED ORDER — ACETAMINOPHEN 650 MG RE SUPP: 650.0000 mg | Freq: Four times a day (QID) | RECTAL | Status: DC | PRN

## 2021-02-14 NOTE — Plan of Care (Signed)
Received pt on stretcher with carelink, aox4, noted mild tremors on both hands and pt stuttering on speech, VSS and given PRN med for pain and will continue plan of care.  Problem: Urinary Elimination: Goal: Signs and symptoms of infection will decrease Outcome: Progressing   Problem: Health Behavior/Discharge Planning: Goal: Ability to manage health-related needs will improve Outcome: Progressing   Problem: Clinical Measurements: Goal: Ability to maintain clinical measurements within normal limits will improve Outcome: Progressing   Problem: Clinical Measurements: Goal: Will remain free from infection Outcome: Progressing   Problem: Activity: Goal: Risk for activity intolerance will decrease Outcome: Progressing   Problem: Nutrition: Goal: Adequate nutrition will be maintained Outcome: Progressing

## 2021-02-14 NOTE — Progress Notes (Addendum)
PROGRESS NOTE    Jordan Ward   ZOX:[id number]  DOB: November 24, 1940  PCP: Valentino Nose, FNP    DOA: 02/13/2021 LOS: 1   Brief Narrative   Jordan Ward is a 80 y.o. male with extensive past medical history which includes multiple sclerosis, suprapubic catheter, dilated cardiomyopathy with diastolic CHF, pulmonary fibrosis, CKD 3, gram-negative bacteremia, coronary artery disease, stroke, recurrent urolithiasis who presented to [facility] ED with complaints of left flank pain and chills.  Urology tele-visit the day before, placement of nephrostomy tube was recommended.      Patient has a complicated urologic history which includes presence of a suprapubic catheter, left ureteric stent placement 09/06/2020, gram-negative sepsis secondary to polymicrobial organisms including Enterococcus faecalis, Pseudomonas and Morganella.   ED Course: Temp Max 99.7, with stable vitals, intermittently hypertensive.  Labs notable for worsened renal function with creatinine 3.23 from last check 2.34.  WBC 10.8k.  Potassium 3.  UA consistent with infection.  CT renal stone protocol showed severe left-sided hydronephrosis and hydroureter worsened as compared to 11/30/2020, now with moderate left perinephric edema and soft tissue stranding.   EDP contacted Dr. [name] who recommended admission to [facility] with plan for stent placement.    Assessment & Plan   Principal Problem:   Hydronephrosis, left Active Problems:   Acute kidney injury superimposed on CKD (HCC)   Pulmonary fibrosis (HCC)   Dilated cardiomyopathy (HCC)   Chronic diastolic (congestive) heart failure (HCC)   Essential hypertension   Multiple sclerosis (HCC)   Chronic suprapubic catheter (HCC)   Tremors of nervous system   Hypothyroidism   Pressure injury of skin   Left hydronephrosis -progressive since imaging on 11/30/2020, with moderate left perinephric edema and soft tissue stranding concerning for infection. -- Urology following, Dr.  [name] -- percutaneous nephrostomy tube planned for today -- N.p.o. until after procedure -- Supportive care: Pain management, antiemetics as needed  AKI superimposed on CKD stage IIIb -presented with creatinine 3.23, baseline unclear but appears was optimized in December 2021 creatinine to 2.34. Suspect multifactorial with poor oral intake and obstructive uropathy contributing. -- Continue gentle IV hydration -- Monitor BMP  UTI in setting of chronic suprapubic catheter for neurogenic bladder -present with low-grade temp 90 9.56F, mild leukocytosis, intermittent rigors.  UA with large leukocytes, negative nitrite, rare bacteria, 21-50 WBCs.  Perinephric edema and stranding seen on CT, consistent with pyelonephritis.  Not septic. -- Continue empiric Zosyn -- Follow-up urine culture  Multiple sclerosis -with baseline tremors.  New onset intermittent speech stuttering since admission which patient states is how his MS initially presented.  He has no focal neurologic deficits.  Noncontrast head CT negative for anything acute. -- Continue home Valium  Hypokalemia -presented with K3.0, replaced. -- Monitor BMP, further replacement as indicated  Chronic respiratory failure with hypoxia -at baseline uses 1.5 L/min at night  Type 2 diabetes -sliding scale NovoLog  Dilated cardiomyopathy -last echo December 2021 with EF 50 to 81%, grade 1 diastolic dysfunction. -- Monitor volume status while on IV hydration asthma  Hypothyroidism -on Synthroid  COPD, pulmonary fibrosis -stable without acute exacerbation.  As needed bronchodilators  Patient BMI: Body mass index is 20.8 kg/m.   DVT prophylaxis: SCDs Start: 02/14/21 0044   Diet:  Diet Orders (From admission, onward)    Start     Ordered   02/14/21 0044  Diet NPO time specified  Diet effective midnight        02/14/21 0043  Code Status: Full Code    Subjective 02/14/21    Patient having shaking rigors when seen this  morning, denies chills but reports being very hot right now.  Does have some stuttering and tremors which he states is exactly how his MS initially presented when he was diagnosed decades ago.  He denies any unilateral numbness tingling or weakness.     Disposition Plan & Communication   Status is: Inpatient  Remains inpatient appropriate because:IV treatments appropriate due to intensity of illness or inability to take PO   Dispo: The patient is from: Home              Anticipated d/c is to: Home              Patient currently is not medically stable to d/c.   Difficult to place patient No   Family Communication: None at bedside, will attempt to call   Consults, Procedures, Significant Events   Consultants:   Urology, Dr. [name]  Procedures:   Plan for today  Antimicrobials:  Anti-infectives (From admission, onward)   Start     Dose/Rate Route Frequency Ordered Stop   02/14/21 0000  piperacillin-tazobactam (ZOSYN) IVPB 2.25 g        2.25 g 100 mL/hr over 30 Minutes Intravenous Every 6 hours 02/13/21 2314          Micro    Objective   Vitals:   02/13/21 2230 02/13/21 2243 02/14/21 0020 02/14/21 0528  BP: (!) 163/77 (!) 163/77 (!) 152/83 134/85  Pulse: 81 78 80 72  Resp:  17 20 16   Temp:  98.3 F (36.8 C) 98.4 F (36.9 C) (!) 97.3 F (36.3 C)  TempSrc:  Oral Oral Oral  SpO2:  92% 97% 100%  Weight:      Height:        Intake/Output Summary (Last 24 hours) at 02/14/2021 0735 Last data filed at 02/14/2021 0600 Gross per 24 hour  Intake 323.03 ml  Output 500 ml  Net -176.97 ml   Filed Weights   02/13/21 1708  Weight: 56.7 kg    Physical Exam:  General exam: awake, alert, no acute distress, mildly ill-appearing HEENT: atraumatic, clear conjunctiva, anicteric sclera, moist mucus membranes, hearing grossly normal  Respiratory system: CTAB, no wheezes, rales or rhonchi, normal respiratory effort. Cardiovascular system: normal S1/S2, RRR, no pedal  edema.   Gastrointestinal system: soft, NT, ND, suprapubic catheter in place mid lower abdomen. Central nervous system: A&O x3.  Stuttering speech.  Moves all extremities.  Grossly nonfocal exam. Skin: dry, intact, warm Psychiatry: normal mood, congruent affect, judgement and insight appear normal  Labs   Data Reviewed: I have personally reviewed following labs and imaging studies  CBC: Recent Labs  Lab 02/13/21 1837 02/14/21 0426  WBC 10.8* 14.8*  NEUTROABS 8.9*  --   HGB 13.4 13.7  HCT 41.6 42.6  MCV 93.5 92.2  PLT 235 355   Basic Metabolic Panel: Recent Labs  Lab 02/13/21 1837 02/14/21 0426  NA 138 145  K 3.0* 3.8  CL 100 110  CO2 27 26  GLUCOSE 127* 100*  BUN 43* 44*  CREATININE 3.23* 3.33*  CALCIUM 9.0 9.4  MG 2.1  --    GFR: Estimated Creatinine Clearance: 14.9 mL/min (A) (by C-G formula based on SCr of 3.33 mg/dL (H)). Liver Function Tests: Recent Labs  Lab 02/13/21 1837  AST 21  ALT 12  ALKPHOS 115  BILITOT 0.6  PROT 8.1  ALBUMIN 4.0  No results for input(s): LIPASE, AMYLASE in the last 168 hours. No results for input(s): AMMONIA in the last 168 hours. Coagulation Profile: No results for input(s): INR, PROTIME in the last 168 hours. Cardiac Enzymes: No results for input(s): CKTOTAL, CKMB, CKMBINDEX, TROPONINI in the last 168 hours. BNP (last 3 results) No results for input(s): PROBNP in the last 8760 hours. HbA1C: No results for input(s): HGBA1C in the last 72 hours. CBG: Recent Labs  Lab 02/14/21 0109 02/14/21 0529  GLUCAP 122* 86   Lipid Profile: No results for input(s): CHOL, HDL, LDLCALC, TRIG, CHOLHDL, LDLDIRECT in the last 72 hours. Thyroid Function Tests: No results for input(s): TSH, T4TOTAL, FREET4, T3FREE, THYROIDAB in the last 72 hours. Anemia Panel: No results for input(s): VITAMINB12, FOLATE, FERRITIN, TIBC, IRON, RETICCTPCT in the last 72 hours. Sepsis Labs: Recent Labs  Lab 02/13/21 1837  LATICACIDVEN 1.9    Recent  Results (from the past 240 hour(s))  Blood culture (routine x 2)     Status: None (Preliminary result)   Collection Time: 02/13/21  6:37 PM   Specimen: Vein; Blood  Result Value Ref Range Status   Specimen Description BLOOD BLOOD LEFT ARM  Final   Special Requests   Final    Blood Culture results may not be optimal due to an inadequate volume of blood received in culture bottles BOTTLES DRAWN AEROBIC AND ANAEROBIC Performed at [facility], [address], [place], [place] [address]    Culture PENDING  Incomplete   Report Status PENDING  Incomplete  Blood culture (routine x 2)     Status: None (Preliminary result)   Collection Time: 02/13/21  6:37 PM   Specimen: Vein; Blood  Result Value Ref Range Status   Specimen Description BLOOD BLOOD RIGHT ARM  Final   Special Requests   Final    Blood Culture adequate volume BOTTLES DRAWN AEROBIC AND ANAEROBIC Performed at [facility], [address] [place], [place] [address]    Culture PENDING  Incomplete   Report Status PENDING  Incomplete  Resp Panel by RT-PCR (Flu A&B, Covid) Nasopharyngeal Swab     Status: None   Collection Time: 02/13/21  8:29 PM   Specimen: Nasopharyngeal Swab; Nasopharyngeal(NP) swabs in vial transport medium  Result Value Ref Range Status   SARS Coronavirus 2 by RT PCR NEGATIVE NEGATIVE Final    Comment: (NOTE) SARS-CoV-2 target nucleic acids are NOT DETECTED.  The SARS-CoV-2 RNA is generally detectable in upper respiratory specimens during the acute phase of infection. The lowest concentration of SARS-CoV-2 viral copies this assay can detect is 138 copies/mL. A negative result does not preclude SARS-Cov-2 infection and should not be used as the sole basis for treatment or other patient management decisions. A negative result may occur with  improper specimen collection/handling, submission of specimen other than nasopharyngeal swab, presence of viral mutation(s) within the areas targeted by this assay, and  inadequate number of viral copies(<138 copies/mL). A negative result must be combined with clinical observations, patient history, and epidemiological information. The expected result is Negative.  Fact Sheet for Patients:  EntrepreneurPulse.com.au  Fact Sheet for Healthcare Providers:  IncredibleEmployment.be  This test is no t yet approved or cleared by the [place] FDA and  has been authorized for detection and/or diagnosis of SARS-CoV-2 by FDA under an Emergency Use Authorization (EUA). This EUA will remain  in effect (meaning this test can be used) for the duration of the COVID-19 declaration under Section 564(b)(1) of the Act, 21  U.S.C.section 360bbb-3(b)(1), unless the authorization is terminated  or revoked sooner.       Influenza A by PCR NEGATIVE NEGATIVE Final   Influenza B by PCR NEGATIVE NEGATIVE Final    Comment: (NOTE) The Xpert Xpress SARS-CoV-2/FLU/RSV plus assay is intended as an aid in the diagnosis of influenza from Nasopharyngeal swab specimens and should not be used as a sole basis for treatment. Nasal washings and aspirates are unacceptable for Xpert Xpress SARS-CoV-2/FLU/RSV testing.  Fact Sheet for Patients: EntrepreneurPulse.com.au  Fact Sheet for Healthcare Providers: IncredibleEmployment.be  This test is not yet approved or cleared by the [place] FDA and has been authorized for detection and/or diagnosis of SARS-CoV-2 by FDA under an Emergency Use Authorization (EUA). This EUA will remain in effect (meaning this test can be used) for the duration of the COVID-19 declaration under Section 564(b)(1) of the Act, 21 U.S.C. section 360bbb-3(b)(1), unless the authorization is terminated or revoked.  Performed at [facility], [address] [name], [place] [address]       Imaging Studies   CT HEAD WO CONTRAST  Result Date: 02/13/2021 CLINICAL DATA:  Speech  difficulty history of multiple sclerosis EXAM: CT HEAD WITHOUT CONTRAST TECHNIQUE: Contiguous axial images were obtained from the base of the skull through the vertex without intravenous contrast. COMPARISON:  MRI 10/03/2011, CT brain 10/02/2011 FINDINGS: Brain: No acute territorial infarction, hemorrhage or intracranial mass. Right greater than left fairly extensive white matter disease with some interval progression. Chronic infarcts in the right white matter, right basal ganglia and right thalamus. Mild atrophy. Stable ventricle size. Vascular: No hyperdense vessels.  Carotid vascular calcification Skull: Normal. Negative for fracture or focal lesion. Sinuses/Orbits: No acute finding. Other: None IMPRESSION: 1. No definite CT evidence for acute intracranial abnormality, however patient is noted to have fairly extensive white matter disease which could potentially obscure acute lesions. MRI follow-up may be obtained as indicated. 2. Atrophy and chronic infarcts in the right white matter, right basal ganglia and right thalamus. Electronically Signed   By: Donavan Foil M.D.   On: 02/13/2021 23:19   CT Renal Stone Study  Result Date: 02/13/2021 CLINICAL DATA:  Left-sided flank pain EXAM: CT ABDOMEN AND PELVIS WITHOUT CONTRAST TECHNIQUE: Multidetector CT imaging of the abdomen and pelvis was performed following the standard protocol without IV contrast. COMPARISON:  CT 11/30/2020, 09/05/2020 FINDINGS: Lower chest: Lung bases demonstrate pulmonary fibrosis. No acute consolidation or pleural effusion. Hepatobiliary: Status post cholecystectomy. Decreased pneumobilia. Dilated extrahepatic common bile duct measuring up to 15 mm. Pancreas: Unremarkable. No pancreatic ductal dilatation or surrounding inflammatory changes. Spleen: Normal in size without focal abnormality. Adrenals/Urinary Tract: Adrenal glands are normal. Atrophic right kidney with intrarenal vascular calcifications and nonobstructing stones measuring  up to 5 mm. Probable small cysts in the right kidney. Severe left-sided hydronephrosis, worse when compared to most recent prior, now also with increased perinephric fat stranding. Left renal cysts. Ureteral stent with proximal pigtail in the renal pelvis and distal pigtail in the bladder. Marked hydroureter which is also increased. Multiple stone fragments in the urinary bladder. Suprapubic catheter is in place. Stone lower pole left kidney measuring 13 mm. Stomach/Bowel: The stomach is nonenlarged. There is no dilated small bowel. No acute bowel Boulos thickening. Postsurgical changes at the sigmoid colon. Vascular/Lymphatic: Advanced aortic atherosclerosis. No aneurysm. Tapered and narrowed appearance of the distal aorta and bifurcation of the iliac vessels. No suspicious nodes. Reproductive: Prostate is unremarkable. Other: Negative for free air or free fluid. Anterior abdominal  Zipper laxity with bulging mesenteric fat and bowel. Musculoskeletal: No acute or suspicious osseous abnormality. IMPRESSION: 1. Severe left-sided hydronephrosis and hydroureter, worsened as compared with 11/30/2020, now with moderate left perinephric edema and soft tissue stranding. 2. There are bilateral intrarenal calculi. There are multiple small stones within the urinary bladder. 3. Atrophic right kidney 4. Pulmonary fibrosis at the lung bases. Electronically Signed   By: Donavan Foil M.D.   On: 02/13/2021 19:30     Medications   Scheduled Meds: . insulin aspart  0-9 Units Subcutaneous Q6H  . levothyroxine  88 mcg Oral Q0600  . mouth rinse  15 mL Mouth Rinse BID   Continuous Infusions: . 0.9 % NaCl with KCl 20 mEq / L 75 mL/hr at 02/14/21 0151  . piperacillin-tazobactam (ZOSYN)  IV 2.25 g (02/14/21 0154)       LOS: 1 day    Time spent: 30 minutes    Jordan Slocumb, DO [facility]  02/14/2021, 7:35 AM      If 7PM-7AM, please contact night-coverage. How to contact the [facility] Attending or Consulting  provider [place] or covering provider during after hours [place], for this patient?    1. Check the care team in [facility] and look for a) attending/consulting TRH provider listed and b) the [facility] team listed 2. Log into [email or web address] and use [place]'s universal password to access. If you do not have the password, please contact the hospital operator. 3. Locate the [facility] provider you are looking for under [facility] and page to a number that you can be directly reached. 4. If you still have difficulty reaching the provider, please page the [facility] (Director on Call) for the Hospitalists listed on amion for assistance.

## 2021-02-14 NOTE — Hospital Course (Addendum)
Jordan Ward is a 80 y.o. male with extensive past medical history which includes multiple sclerosis, suprapubic catheter, dilated cardiomyopathy with diastolic CHF, pulmonary fibrosis, CKD 3, gram-negative bacteremia, coronary artery disease, stroke, recurrent urolithiasis who presented to [facility] ED with complaints of left flank pain and chills.  Urology tele-visit the day before, placement of nephrostomy tube was recommended.      Patient has a complicated urologic history which includes presence of a suprapubic catheter, left ureteric stent placement 09/06/2020, gram-negative sepsis secondary to polymicrobial organisms including Enterococcus faecalis, Pseudomonas and Morganella.   ED Course: Temp Max 99.7, with stable vitals, intermittently hypertensive.  Labs notable for worsened renal function with creatinine 3.23 from last check 2.34.  WBC 10.8k.  Potassium 3.  UA consistent with infection.  CT renal stone protocol showed severe left-sided hydronephrosis and hydroureter worsened as compared to 11/30/2020, now with moderate left perinephric edema and soft tissue stranding.   EDP contacted Dr. [name] who recommended admission to [facility] with plan for intervention.

## 2021-02-15 ENCOUNTER — Inpatient Hospital Stay (HOSPITAL_COMMUNITY): Payer: Medicare Other

## 2021-02-15 DIAGNOSIS — N1330 Unspecified hydronephrosis: Secondary | ICD-10-CM | POA: Diagnosis not present

## 2021-02-15 HISTORY — PX: IR NEPHROSTOMY PLACEMENT LEFT: IMG6063

## 2021-02-15 LAB — BASIC METABOLIC PANEL
Anion gap: 5 (ref 5–15)
BUN: 51 mg/dL — ABNORMAL HIGH (ref 8–23)
CO2: 27 mmol/L (ref 22–32)
Calcium: 8.9 mg/dL (ref 8.9–10.3)
Chloride: 121 mmol/L — ABNORMAL HIGH (ref 98–111)
Creatinine, Ser: 3.16 mg/dL — ABNORMAL HIGH (ref 0.61–1.24)
GFR, Estimated: 19 mL/min — ABNORMAL LOW (ref >60)
Glucose, Bld: 89 mg/dL (ref 70–99)
Potassium: 3.9 mmol/L (ref 3.5–5.1)
Sodium: 153 mmol/L — ABNORMAL HIGH (ref 135–145)

## 2021-02-15 LAB — URINE CULTURE

## 2021-02-15 LAB — CBC
HCT: 40.9 % (ref 39.0–52.0)
Hemoglobin: 12.5 g/dL — ABNORMAL LOW (ref 13.0–17.0)
MCH: 29.6 pg (ref 26.0–34.0)
MCHC: 30.6 g/dL (ref 30.0–36.0)
MCV: 96.7 fL (ref 80.0–100.0)
Platelets: 164 10*3/uL (ref 150–400)
RBC: 4.23 MIL/uL (ref 4.22–5.81)
RDW: 15.3 % (ref 11.5–15.5)
WBC: 9.7 10*3/uL (ref 4.0–10.5)
nRBC: 0 % (ref 0.0–0.2)

## 2021-02-15 LAB — PROTIME-INR
INR: 1.2 (ref 0.8–1.2)
Prothrombin Time: 15.5 seconds — ABNORMAL HIGH (ref 11.4–15.2)

## 2021-02-15 LAB — GLUCOSE, CAPILLARY
Glucose-Capillary: 103 mg/dL — ABNORMAL HIGH (ref 70–99)
Glucose-Capillary: 134 mg/dL — ABNORMAL HIGH (ref 70–99)
Glucose-Capillary: 157 mg/dL — ABNORMAL HIGH (ref 70–99)

## 2021-02-15 MED ORDER — MIDAZOLAM HCL 2 MG/2ML IJ SOLN
INTRAMUSCULAR | Status: AC
  Filled 2021-02-15: qty 4

## 2021-02-15 MED ORDER — LIDOCAINE HCL 1 % IJ SOLN
INTRAMUSCULAR | Status: AC
  Administered 2021-02-15: 3 mL
  Filled 2021-02-15: qty 20

## 2021-02-15 MED ORDER — DEXTROSE-NACL 5-0.45 % IV SOLN: INTRAVENOUS | Status: DC

## 2021-02-15 MED ORDER — LIDOCAINE-EPINEPHRINE 1 %-1:100000 IJ SOLN
INTRAMUSCULAR | Status: AC | PRN
  Administered 2021-02-15: 10 mL

## 2021-02-15 MED ORDER — FENTANYL CITRATE (PF) 100 MCG/2ML IJ SOLN
INTRAMUSCULAR | Status: AC | PRN
  Administered 2021-02-15 (×2): 50 ug via INTRAVENOUS

## 2021-02-15 MED ORDER — IOHEXOL 300 MG/ML  SOLN
10.0000 mL | Freq: Once | INTRAMUSCULAR | Status: AC | PRN
  Administered 2021-02-15: 5 mL

## 2021-02-15 MED ORDER — CHLORHEXIDINE GLUCONATE CLOTH 2 % EX PADS
6.0000 | MEDICATED_PAD | Freq: Every day | CUTANEOUS | Status: DC
  Administered 2021-02-15 – 2021-02-17 (×3): 6 via TOPICAL

## 2021-02-15 MED ORDER — LIDOCAINE HCL 1 % IJ SOLN
INTRAMUSCULAR | Status: AC
  Filled 2021-02-15: qty 20

## 2021-02-15 MED ORDER — FENTANYL CITRATE (PF) 100 MCG/2ML IJ SOLN
INTRAMUSCULAR | Status: AC
  Filled 2021-02-15: qty 2

## 2021-02-15 MED ORDER — MIDAZOLAM HCL 2 MG/2ML IJ SOLN
INTRAMUSCULAR | Status: AC | PRN
  Administered 2021-02-15 (×3): 1 mg via INTRAVENOUS

## 2021-02-15 NOTE — TOC Initial Note (Signed)
Transition of Care [facility]) - Initial/Assessment Note    Patient Details  Name: Jordan Ward MRN: [id number] Date of Birth: 11/11/1940  Transition of Care [facility]) CM/SW Contact:    Dessa Phi, RN Phone Number: 02/15/2021, 11:56 AM  Clinical Narrative:Spoke to spouse Mechele Claude about d/c plans-return home. Has family support,pcp,pharmacy. Noted high risk-[facility] cons-rep Bobbie aware. Continue to monitor.                   Expected Discharge Plan: Home/Self Care Barriers to Discharge: Continued Medical Work up   Patient Goals and CMS Choice Patient states their goals for this hospitalization and ongoing recovery are:: go home CMS Medicare.gov Compare Post Acute Care list provided to:: Patient Represenative (must comment)    Expected Discharge Plan and Services Expected Discharge Plan: Home/Self Care   Discharge Planning Services: CM Consult                                          Prior Living Arrangements/Services   Lives with:: Spouse Patient language and need for interpreter reviewed:: Yes Do you feel safe going back to the place where you live?: Yes      Need for Family Participation in Patient Care: No (Comment) Care giver support system in place?: Yes (comment) Current home services: DME (rw) Criminal Activity/Legal Involvement Pertinent to Current Situation/Hospitalization: No - Comment as needed  Activities of Daily Living Home Assistive Devices/Equipment: Oxygen,Eyeglasses,Walker (specify type) ADL Screening (condition at time of admission) Patient's cognitive ability adequate to safely complete daily activities?: No Is the patient deaf or have difficulty hearing?: No Does the patient have difficulty seeing, even when wearing glasses/contacts?: No Does the patient have difficulty concentrating, remembering, or making decisions?: No Patient able to express need for assistance with ADLs?: Yes Does the patient have difficulty dressing or bathing?:  No Independently performs ADLs?: No Communication: Independent Dressing (OT): Needs assistance Is this a change from baseline?: Pre-admission baseline Grooming: Needs assistance Is this a change from baseline?: Pre-admission baseline Feeding: Independent Does the patient have difficulty walking or climbing stairs?: No Weakness of Legs: None Weakness of Arms/Hands: None  Permission Sought/Granted Permission sought to share information with : Case Manager Permission granted to share information with : Yes, Verbal Permission Granted  Share Information with NAME: Case manager           Emotional Assessment Appearance:: Appears stated age Attitude/Demeanor/Rapport: Gracious Affect (typically observed): Accepting Orientation: : Oriented to Self,Oriented to Place,Oriented to  Time,Oriented to Situation Alcohol / Substance Use: Not Applicable Psych Involvement: No (comment)  Admission diagnosis:  Hydronephrosis, left [N13.30] Hydronephrosis of left kidney [N13.30] Patient Active Problem List   Diagnosis Date Noted  . Hydronephrosis, left 02/13/2021  . Encounter for removal of vascular catheter 09/14/2020  . Acute respiratory failure with hypoxia ([place]) 07/12/2020  . Pneumonia due to COVID-19 virus 07/12/2020  . Encephalopathy due to COVID-19 virus 07/12/2020  . Acute hypoxemic respiratory failure due to COVID-19 ([place]) 07/03/2020  . Calculus of ureter 05/15/2020  . Bacteremia due to Gram-negative bacteria 05/04/2020  . Acute lower UTI 05/04/2020  . Acute unilateral obstructive uropathy   . SIRS (systemic inflammatory response syndrome) ([place]) 05/03/2020  . Hydronephrosis 05/02/2020  . Complication of cystostomy catheter, subsequent encounter 04/20/2020  . Acute kidney injury superimposed on CKD ([place]) 03/02/2020  . Dehydration 03/02/2020  . Nausea and vomiting 03/02/2020  . Ventral  hernia without obstruction or gangrene 03/01/2020  . Hypotension 03/01/2020  . Poor appetite  03/01/2020  . Urinary tract infection 03/01/2020  . Detrusor areflexia 09/16/2019  . Cerebrovascular disease 08/17/2018  . Partial small bowel obstruction ([place]) 07/11/2017  . Choledocholithiasis 07/11/2017  . Complicated UTI (urinary tract infection)   . Polymicrobial septicemia ([place]) 06/14/2017  . Paroxysmal atrial tachycardia ([place]) 06/14/2017  . Hypophosphatemia 06/14/2017  . Pressure injury of skin 05/26/2017  . Hydronephrosis due to obstruction of ureter 05/26/2017  . Obstructive uropathy 05/25/2017  . CKD (chronic kidney disease), stage III ([place]) 08/23/2016  . Hydronephrosis of left kidney 08/23/2016  . Kidney stones 09/13/2015  . Essential hypertension   . Pressure ulcer 08/04/2015  . Chronic diastolic (congestive) heart failure ([place]) 08/04/2015  . Hydronephrosis with obstructing calculus 08/04/2015  . Occult blood positive stool 09/05/2014  . Rash and nonspecific skin eruption 08/25/2014  . Edema 08/19/2014  . Subacute confusional state 08/06/2014  . Dilated cardiomyopathy ([place]) 07/21/2014  . Sepsis ([place]) 07/17/2014  . Severe sepsis ([place]) 07/17/2014  . Headache 03/13/2014  . CVA (cerebral infarction) 03/13/2014  . Abnormality of gait 03/13/2014  . Tremors of nervous system 10/08/2011  . Hypothyroidism 10/08/2011  . Pulmonary nodule 10/08/2011  . Dysphagia 10/07/2011  . HTN (hypertension), malignant 10/06/2011  . Chronic suprapubic catheter ([place]) 10/06/2011  . Pulmonary fibrosis ([place]) 10/06/2011  . UTI (urinary tract infection) 10/03/2011  . PNA (pneumonia) 10/02/2011  . Junctional rhythm 10/02/2011  . Multiple sclerosis ([place]) 10/02/2011  . Chronic diarrhea 06/17/2011  . Hx of adenomatous colonic polyps 06/17/2011  . High grade dysplasia in colonic adenoma 09/29/2005   PCP:  Valentino Nose, FNP Pharmacy:   [facility], [address] [place] [address] Phone: [phone] Fax: [phone]     Social Determinants  of Health (SDOH) Interventions    Readmission Risk Interventions Readmission Risk Prevention Plan 02/15/2021 09/06/2020  Transportation Screening - Complete  PCP or Specialist Appt within 5-7 Days Complete -  Home Care Screening Complete -  Medication Review (RN CM) Complete -  HRI or [place] - Complete  Social Work Consult for [place] Planning/Counseling - Complete  Palliative Care Screening - Not Applicable  Medication Review Press photographer) - Complete  Some recent data might be hidden

## 2021-02-15 NOTE — Progress Notes (Signed)
PROGRESS NOTE    Jordan Ward   DJS:[id number]  DOB: 1941/09/21  PCP: Valentino Nose, FNP    DOA: 02/13/2021 LOS: 2   Brief Narrative   Jordan Ward is a 80 y.o. male with extensive past medical history which includes multiple sclerosis, suprapubic catheter, dilated cardiomyopathy with diastolic CHF, pulmonary fibrosis, CKD 3, gram-negative bacteremia, coronary artery disease, stroke, recurrent urolithiasis who presented to [facility] ED with complaints of left flank pain and chills.  Urology tele-visit the day before, placement of nephrostomy tube was recommended.      Patient has a complicated urologic history which includes presence of a suprapubic catheter, left ureteric stent placement 09/06/2020, gram-negative sepsis secondary to polymicrobial organisms including Enterococcus faecalis, Pseudomonas and Morganella.   ED Course: Temp Max 99.7, with stable vitals, intermittently hypertensive.  Labs notable for worsened renal function with creatinine 3.23 from last check 2.34.  WBC 10.8k.  Potassium 3.  UA consistent with infection.  CT renal stone protocol showed severe left-sided hydronephrosis and hydroureter worsened as compared to 11/30/2020, now with moderate left perinephric edema and soft tissue stranding.   EDP contacted Dr. [name] who recommended admission to [facility] with plan for intervention.    Assessment & Plan   Principal Problem:   Hydronephrosis, left Active Problems:   Acute kidney injury superimposed on CKD (HCC)   Pulmonary fibrosis (HCC)   Dilated cardiomyopathy (HCC)   Chronic diastolic (congestive) heart failure (HCC)   Essential hypertension   Multiple sclerosis (HCC)   Chronic suprapubic catheter (HCC)   Tremors of nervous system   Hypothyroidism   Pressure injury of skin   Left hydronephrosis -progressive since imaging on 11/30/2020, with moderate left perinephric edema and soft tissue stranding concerning for infection. -- Urology consulted, Dr.  [name] paged -- percutaneous nephrostomy tube placement today with IR -- N.p.o. until after procedure -- Supportive care: Pain management, antiemetics as needed  AKI superimposed on CKD stage IIIb -presented with creatinine 3.23, baseline unclear but appears was optimized in December 2021 creatinine to 2.34. Suspect multifactorial with poor oral intake and obstructive uropathy contributing. -- Continue gentle IV hydration -- Monitor BMP  UTI in setting of chronic suprapubic catheter for neurogenic bladder -present with low-grade temp 90 9.75F, mild leukocytosis, intermittent rigors.  UA with large leukocytes, negative nitrite, rare bacteria, 21-50 WBCs.  Perinephric edema and stranding seen on CT, consistent with pyelonephritis.  Not septic. -- Continue empiric Zosyn -- Follow-up urine culture  Hyponatremia - Na 153 on 5/20.  Due to n.p.o. status for procedure.   --Start D5- 1/2 NS.   --Monitor BMP  Difficult vascular access -IR to place PICC line today  Multiple sclerosis -with baseline tremors.  New onset intermittent speech stuttering since admission which patient states is how his MS initially presented.  He has no focal neurologic deficits.  Noncontrast head CT negative for anything acute. -- Continue home Valium  Hypokalemia -presented with K3.0, replaced. -- Monitor BMP, further replacement as indicated  Chronic respiratory failure with hypoxia -at baseline uses 1.5 L/min at night  Type 2 diabetes -sliding scale NovoLog  Dilated cardiomyopathy -last echo December 2021 with EF 50 to 88%, grade 1 diastolic dysfunction. -- Monitor volume status while on IV hydration asthma  Hypothyroidism -on Synthroid  COPD, pulmonary fibrosis -stable without acute exacerbation.  As needed bronchodilators  Patient BMI: Body mass index is 20.8 kg/m.   DVT prophylaxis: SCDs Start: 02/14/21 0044   Diet:  Diet Orders (From admission,  onward)    Start     Ordered   02/15/21 0001  Diet  NPO time specified  Diet effective midnight        02/14/21 1838            Code Status: Full Code    Subjective 02/15/21    Patient up in recliner when seen this morning.  Reports being thirsty, is n.p.o. for procedure.  States he had just signed consent for nephrostomy placement.  He reports ongoing hot and cold spells.  Stuttering better.  Still has some episodes of shaking as well.  No other acute complaints or events reported.   Disposition Plan & Communication   Status is: Inpatient  Remains inpatient appropriate because:IV treatments appropriate due to intensity of illness or inability to take PO   Dispo: The patient is from: Home              Anticipated d/c is to: Home              Patient currently is not medically stable to d/c.   Difficult to place patient No   Family Communication: None at bedside, will attempt to call   Consults, Procedures, Significant Events   Consultants:   Urology, Dr. [name]  Procedures:   Plan for today  Antimicrobials:  Anti-infectives (From admission, onward)   Start     Dose/Rate Route Frequency Ordered Stop   02/14/21 1800  piperacillin-tazobactam (ZOSYN) IVPB 2.25 g        2.25 g 100 mL/hr over 30 Minutes Intravenous Every 6 hours 02/14/21 1420     02/14/21 0000  piperacillin-tazobactam (ZOSYN) IVPB 2.25 g  Status:  Discontinued        2.25 g 100 mL/hr over 30 Minutes Intravenous Every 6 hours 02/13/21 2314 02/14/21 1420        Micro    Objective   Vitals:   02/15/21 0526 02/15/21 1330 02/15/21 1350 02/15/21 1355  BP: (!) 181/77 (!) 158/93 (!) 146/87 132/84  Pulse: 66 82 71 (!) 25  Resp: 20 19 13 13   Temp: 97.6 F (36.4 C)     TempSrc: Oral     SpO2: 100% 99% 100% (!) 89%  Weight:      Height:        Intake/Output Summary (Last 24 hours) at 02/15/2021 1403 Last data filed at 02/15/2021 0600 Gross per 24 hour  Intake 160 ml  Output 1250 ml  Net -1090 ml   Filed Weights   02/13/21 1708  Weight:  56.7 kg    Physical Exam:  General exam: awake, alert, no acute distress, mildly ill-appearing, up in recliner, underweight, chronically ill-appearing Respiratory system: CTAB, no wheezes, rales or rhonchi, normal respiratory effort. Cardiovascular system: normal S1/S2, RRR, no pedal edema.   Gastrointestinal system: soft, NT, ND, suprapubic catheter in place mid lower abdomen. Central nervous system: A&O x3.  Normal speech without stuttering.  Grossly nonfocal exam. Psychiatry: normal mood, congruent affect, judgement and insight appear normal  Labs   Data Reviewed: I have personally reviewed following labs and imaging studies  CBC: Recent Labs  Lab 02/13/21 1837 02/14/21 0426 02/15/21 0413  WBC 10.8* 14.8* 9.7  NEUTROABS 8.9*  --   --   HGB 13.4 13.7 12.5*  HCT 41.6 42.6 40.9  MCV 93.5 92.2 96.7  PLT 235 186 160   Basic Metabolic Panel: Recent Labs  Lab 02/13/21 1837 02/14/21 0426 02/15/21 0413  NA 138 145 153*  K 3.0*  3.8 3.9  CL 100 110 121*  CO2 27 26 27   GLUCOSE 127* 100* 89  BUN 43* 44* 51*  CREATININE 3.23* 3.33* 3.16*  CALCIUM 9.0 9.4 8.9  MG 2.1  --   --    GFR: Estimated Creatinine Clearance: 15.7 mL/min (A) (by C-G formula based on SCr of 3.16 mg/dL (H)). Liver Function Tests: Recent Labs  Lab 02/13/21 1837  AST 21  ALT 12  ALKPHOS 115  BILITOT 0.6  PROT 8.1  ALBUMIN 4.0   No results for input(s): LIPASE, AMYLASE in the last 168 hours. No results for input(s): AMMONIA in the last 168 hours. Coagulation Profile: Recent Labs  Lab 02/15/21 0906  INR 1.2   Cardiac Enzymes: No results for input(s): CKTOTAL, CKMB, CKMBINDEX, TROPONINI in the last 168 hours. BNP (last 3 results) No results for input(s): PROBNP in the last 8760 hours. HbA1C: Recent Labs    02/14/21 0641  HGBA1C 5.9*   CBG: Recent Labs  Lab 02/14/21 1147 02/14/21 1802 02/14/21 2307 02/15/21 0513 02/15/21 1130  GLUCAP 74 90 137* 103* 134*   Lipid Profile: No  results for input(s): CHOL, HDL, LDLCALC, TRIG, CHOLHDL, LDLDIRECT in the last 72 hours. Thyroid Function Tests: No results for input(s): TSH, T4TOTAL, FREET4, T3FREE, THYROIDAB in the last 72 hours. Anemia Panel: No results for input(s): VITAMINB12, FOLATE, FERRITIN, TIBC, IRON, RETICCTPCT in the last 72 hours. Sepsis Labs: Recent Labs  Lab 02/13/21 1837  LATICACIDVEN 1.9    Recent Results (from the past 240 hour(s))  Blood culture (routine x 2)     Status: None (Preliminary result)   Collection Time: 02/13/21  6:37 PM   Specimen: BLOOD  Result Value Ref Range Status   Specimen Description BLOOD BLOOD LEFT ARM  Final   Special Requests   Final    Blood Culture results may not be optimal due to an inadequate volume of blood received in culture bottles BOTTLES DRAWN AEROBIC AND ANAEROBIC   Culture   Final    NO GROWTH 2 DAYS Performed at [facility], [address] [place], [place] [address]    Report Status PENDING  Incomplete  Blood culture (routine x 2)     Status: None (Preliminary result)   Collection Time: 02/13/21  6:37 PM   Specimen: BLOOD  Result Value Ref Range Status   Specimen Description BLOOD BLOOD RIGHT ARM  Final   Special Requests   Final    Blood Culture adequate volume BOTTLES DRAWN AEROBIC AND ANAEROBIC   Culture   Final    NO GROWTH 2 DAYS Performed at [facility], [address], [place], [place] [address]    Report Status PENDING  Incomplete  Resp Panel by RT-PCR (Flu A&B, Covid) Nasopharyngeal Swab     Status: None   Collection Time: 02/13/21  8:29 PM   Specimen: Nasopharyngeal Swab; Nasopharyngeal(NP) swabs in vial transport medium  Result Value Ref Range Status   SARS Coronavirus 2 by RT PCR NEGATIVE NEGATIVE Final    Comment: (NOTE) SARS-CoV-2 target nucleic acids are NOT DETECTED.  The SARS-CoV-2 RNA is generally detectable in upper respiratory specimens during the acute phase of infection. The lowest concentration of SARS-CoV-2 viral copies  this assay can detect is 138 copies/mL. A negative result does not preclude SARS-Cov-2 infection and should not be used as the sole basis for treatment or other patient management decisions. A negative result may occur with  improper specimen collection/handling, submission of specimen other than nasopharyngeal swab, presence  of viral mutation(s) within the areas targeted by this assay, and inadequate number of viral copies(<138 copies/mL). A negative result must be combined with clinical observations, patient history, and epidemiological information. The expected result is Negative.  Fact Sheet for Patients:  EntrepreneurPulse.com.au  Fact Sheet for Healthcare Providers:  IncredibleEmployment.be  This test is no t yet approved or cleared by the [place] FDA and  has been authorized for detection and/or diagnosis of SARS-CoV-2 by FDA under an Emergency Use Authorization (EUA). This EUA will remain  in effect (meaning this test can be used) for the duration of the COVID-19 declaration under Section 564(b)(1) of the Act, 21 U.S.C.section 360bbb-3(b)(1), unless the authorization is terminated  or revoked sooner.       Influenza A by PCR NEGATIVE NEGATIVE Final   Influenza B by PCR NEGATIVE NEGATIVE Final    Comment: (NOTE) The Xpert Xpress SARS-CoV-2/FLU/RSV plus assay is intended as an aid in the diagnosis of influenza from Nasopharyngeal swab specimens and should not be used as a sole basis for treatment. Nasal washings and aspirates are unacceptable for Xpert Xpress SARS-CoV-2/FLU/RSV testing.  Fact Sheet for Patients: EntrepreneurPulse.com.au  Fact Sheet for Healthcare Providers: IncredibleEmployment.be  This test is not yet approved or cleared by the [place] FDA and has been authorized for detection and/or diagnosis of SARS-CoV-2 by FDA under an Emergency Use Authorization (EUA). This EUA  will remain in effect (meaning this test can be used) for the duration of the COVID-19 declaration under Section 564(b)(1) of the Act, 21 U.S.C. section 360bbb-3(b)(1), unless the authorization is terminated or revoked.  Performed at [facility], [address] [place], [place] [address]   Culture, Urine     Status: Abnormal   Collection Time: 02/13/21 10:54 PM   Specimen: Urine, Clean Catch  Result Value Ref Range Status   Specimen Description   Final    URINE, CLEAN CATCH Performed at [facility], [address] [place], [place] [address]    Special Requests   Final    NONE Performed at [facility], [address] [place], [place] [address]    Culture MULTIPLE SPECIES PRESENT, SUGGEST RECOLLECTION (A)  Final   Report Status 02/15/2021 FINAL  Final      Imaging Studies   CT HEAD WO CONTRAST  Result Date: 02/13/2021 CLINICAL DATA:  Speech difficulty history of multiple sclerosis EXAM: CT HEAD WITHOUT CONTRAST TECHNIQUE: Contiguous axial images were obtained from the base of the skull through the vertex without intravenous contrast. COMPARISON:  MRI 10/03/2011, CT brain 10/02/2011 FINDINGS: Brain: No acute territorial infarction, hemorrhage or intracranial mass. Right greater than left fairly extensive white matter disease with some interval progression. Chronic infarcts in the right white matter, right basal ganglia and right thalamus. Mild atrophy. Stable ventricle size. Vascular: No hyperdense vessels.  Carotid vascular calcification Skull: Normal. Negative for fracture or focal lesion. Sinuses/Orbits: No acute finding. Other: None IMPRESSION: 1. No definite CT evidence for acute intracranial abnormality, however patient is noted to have fairly extensive white matter disease which could potentially obscure acute lesions. MRI follow-up may be obtained as indicated. 2. Atrophy and chronic infarcts in the right white matter, right basal ganglia and right thalamus. Electronically Signed    By: Donavan Foil M.D.   On: 02/13/2021 23:19   CT Renal Stone Study  Result Date: 02/13/2021 CLINICAL DATA:  Left-sided flank pain EXAM: CT ABDOMEN AND PELVIS WITHOUT CONTRAST TECHNIQUE: Multidetector CT imaging of the abdomen and pelvis was performed  following the standard protocol without IV contrast. COMPARISON:  CT 11/30/2020, 09/05/2020 FINDINGS: Lower chest: Lung bases demonstrate pulmonary fibrosis. No acute consolidation or pleural effusion. Hepatobiliary: Status post cholecystectomy. Decreased pneumobilia. Dilated extrahepatic common bile duct measuring up to 15 mm. Pancreas: Unremarkable. No pancreatic ductal dilatation or surrounding inflammatory changes. Spleen: Normal in size without focal abnormality. Adrenals/Urinary Tract: Adrenal glands are normal. Atrophic right kidney with intrarenal vascular calcifications and nonobstructing stones measuring up to 5 mm. Probable small cysts in the right kidney. Severe left-sided hydronephrosis, worse when compared to most recent prior, now also with increased perinephric fat stranding. Left renal cysts. Ureteral stent with proximal pigtail in the renal pelvis and distal pigtail in the bladder. Marked hydroureter which is also increased. Multiple stone fragments in the urinary bladder. Suprapubic catheter is in place. Stone lower pole left kidney measuring 13 mm. Stomach/Bowel: The stomach is nonenlarged. There is no dilated small bowel. No acute bowel Carrender thickening. Postsurgical changes at the sigmoid colon. Vascular/Lymphatic: Advanced aortic atherosclerosis. No aneurysm. Tapered and narrowed appearance of the distal aorta and bifurcation of the iliac vessels. No suspicious nodes. Reproductive: Prostate is unremarkable. Other: Negative for free air or free fluid. Anterior abdominal Korman laxity with bulging mesenteric fat and bowel. Musculoskeletal: No acute or suspicious osseous abnormality. IMPRESSION: 1. Severe left-sided hydronephrosis and  hydroureter, worsened as compared with 11/30/2020, now with moderate left perinephric edema and soft tissue stranding. 2. There are bilateral intrarenal calculi. There are multiple small stones within the urinary bladder. 3. Atrophic right kidney 4. Pulmonary fibrosis at the lung bases. Electronically Signed   By: Donavan Foil M.D.   On: 02/13/2021 19:30     Medications   Scheduled Meds: . fentaNYL      . levothyroxine  88 mcg Oral Q0600  . lidocaine      . mouth rinse  15 mL Mouth Rinse BID  . midazolam       Continuous Infusions: . dextrose 5 % and 0.45% NaCl 100 mL/hr at 02/15/21 0837  . piperacillin-tazobactam (ZOSYN)  IV 2.25 g (02/15/21 1203)       LOS: 2 days    Time spent: 25 minutes with > 50% spent at bedside and in coordination of care.    Ezekiel Slocumb, DO [facility]  02/15/2021, 2:03 PM      If 7PM-7AM, please contact night-coverage. How to contact the [facility] Attending or Consulting provider [place] or covering provider during after hours [place], for this patient?    1. Check the care team in [facility] and look for a) attending/consulting [facility] provider listed and b) the [facility] team listed 2. Log into [email or web address] and use [place]'s universal password to access. If you do not have the password, please contact the hospital operator. 3. Locate the [facility] provider you are looking for under [facility] and page to a number that you can be directly reached. 4. If you still have difficulty reaching the provider, please page the [facility] (Director on Call) for the Hospitalists listed on amion for assistance.

## 2021-02-15 NOTE — Procedures (Signed)
Interventional Radiology Procedure:   Indications: Left hydronephrosis and elevated creatinine.  Poor venous access  Procedure: Left nephrostomy tube placement and right arm PICC line placement  Findings: Right brachial PICC, dual lumen, 37 cm, tip at SVC/RA junction.  10 Fr tube in left renal pelvis.  Cloudy urine.  Complications: None     EBL: less than 10 ml  Plan: Follow drain output.  Send fluid for culture.     Cabella Kimm R. Anselm Pancoast, MD  Pager: [phone]

## 2021-02-15 NOTE — Progress Notes (Signed)
Referring Physician(s): Dahlstedt,S/Griffith,K  Supervising Physician: Markus Daft  Patient Status:  [facility] - In-pt  Chief Complaint: Weakness, recent left flank pain, chills   Subjective: Patient familiar to IR service from left nephrostomy in 2016, HD catheter placement in 2016, left nephrostomy in 2018 and 2021.  He has a past medical history significant for multiple sclerosis with neurogenic bladder and previous suprapubic catheter placement, diabetes, hypothyroidism, sleep apnea, peripheral vascular disease, hyperlipidemia, hypertension, left ureteral stent placement in December 2021, dilated cardiomyopathy with diastolic CHF, COPD/pulmonary fibrosis, chronic kidney disease, coronary artery disease, stroke, and recurrent urolithiasis with gram-negative bacteremia.  He recently presented to [facility] ED with left flank pain, worsening renal function, stuttering speech and chills.  Urinalysis revealed large hemoglobin with large leukocytes.  Urine culture with multiple species.  CT scan revealed severe left-sided hydronephrosis and hydroureter with moderate left perinephric edema and soft tissue stranding, bilateral intrarenal calculi, multiple small stones within the bladder, atrophic right kidney.  Following evaluation by urology it was recommended that patient be transferred to [facility] for left nephrostomy placement;  latest labs include WBC 9.7, hemoglobin 12.5, platelets 164K, creatinine 3.16, potassium 3.9.  Patient currently on Zosyn.  He denies fever, headache, chest pain, worsening dyspnea, cough, abdominal/back pain, nausea, vomiting or bleeding.    Past Medical History:  Diagnosis Date  . Anemia   . Arthritis   . Back pain, chronic   . Bilateral carotid bruits   . C. difficile colitis 09/2011  . CAD (coronary artery disease)   . Carotid artery stenosis   . Cerebrovascular disease   . Cerebrovascular disease 08/17/2018  . Colon polyps   . Complication of cystostomy  catheter, initial encounter ([place]) 04/20/2020  . Dyslipidemia   . Dysphagia 10/07/2011  . Encephalopathy   . Gait disorder   . HA (headache)   . High grade dysplasia in colonic adenoma 09/2005  . History of kidney stones   . HTN (hypertension), malignant 10/06/2011  . Hypernatremia   . Hypokalemia   . Hypothyroidism 10/08/2011  . Insomnia   . Junctional rhythm   . Kidney stones   . MS (multiple sclerosis) ([place])   . Neuromuscular disorder (HCC)    MS  . OSA (obstructive sleep apnea)   . Paroxysmal atrial tachycardia ([place])   . Peripheral vascular disease ([place])   . Pneumonia 4 yrs ago  . Pulmonary fibrosis ([place]) 10/06/2011  . Pulmonary nodule 10/08/2011  . PVD (peripheral vascular disease) ([place])   . Sacral ulcer ([place])   . Sleep apnea    cannot tolerate  . Stroke [facility])    left sided weakness  . Suprapubic catheter ([place])   . TIA (transient ischemic attack)   . Tremors of nervous system 10/08/2011  . Urinary tract infection   . Ventral hernia without obstruction or gangrene     Large 8X9cm ventral hernia with loss of domain. CT reads report as diastasis recti with herniation or diastasis recti.  Dr. Constance Haw, Surgery, reviewed CT with radiology and there is herniation with only hernia sac or peritoneum over the bowel and large separation of the rectus muslce (i.e. diastasis recti aka loss of domain).  No surgical intervention recommended given size, age, and health.    Past Surgical History:  Procedure Laterality Date  . APPENDECTOMY  09/2005   at time of left hemicolectomy  . BACK SURGERY  1976/1979   lower  . BILIARY DILATION N/A 03/03/2020   Procedure: BILIARY DILATION;  Surgeon: Laural Golden,  Mechele Dawley, MD;  Location: [facility] END[facility];  Service: Endoscopy;  Laterality: N/A;  . CATARACT EXTRACTION W/PHACO Right 03/08/2018   Procedure: CATARACT EXTRACTION PHACO AND INTRAOCULAR LENS PLACEMENT RIGHT EYE;  Surgeon: Tonny Branch, MD;  Location: [facility] ORS;  Service: Ophthalmology;  Laterality: Right;  CDE: 8.86   . CATARACT EXTRACTION W/PHACO Left 04/05/2018   Procedure: CATARACT EXTRACTION PHACO AND INTRAOCULAR LENS PLACEMENT (IOC);  Surgeon: Tonny Branch, MD;  Location: [facility] ORS;  Service: Ophthalmology;  Laterality: Left;  CDE: 7.36  . CENTRAL LINE INSERTION-TUNNELED Right 09/11/2020   Procedure: PLACEMENT OF TUNNELED CENTRAL LINE INTO JUGULAR VEIN;  Surgeon: Virl Cagey, MD;  Location: [facility] ORS;  Service: General;  Laterality: Right;  . CHOLECYSTECTOMY     Dr. Tamala Julian  . COLON SURGERY  09/2005   [place]: four tubular adenomas, large adenomatous polyp with HIGH GRADE dysplasia  . COLONOSCOPY  11/2004   Dr. Sharol Roussel sessile polyp splenic flexure, 51mm sessile polyp desc colon, tubulovillous adenoma (bx not removed)  . COLONOSCOPY  01/2005   poor prep, polyp could not be found  . COLONOSCOPY  05/2005   with EMR, polypectomy Dr. Olegario Messier, bx showed high grade dysplasia, partially resected  . COLONOSCOPY  09/2005   Dr. Arsenio Loader, [place] ink tattooing, four villous colon polyp (3 had been missed on previous colonoscopies due to limitations of procedures  . COLONOSCOPY  09/2006   normal TI, no polyps  . COLONOSCOPY  10/2007   Dr. Imogene Burn distal mammillations, benign bx, normal TI, random bx neg for microscopic colitis  . CYSTOSCOPY W/ URETERAL STENT PLACEMENT Left 09/05/2020   Procedure: CYSTOSCOPY WITH RETROGRADE PYELOGRAM/URETERAL STENT PLACEMENT;  Surgeon: Ardis Hughs, MD;  Location: [facility] ORS;  Service: Urology;  Laterality: Left;  . CYSTOSCOPY WITH LITHOLAPAXY N/A 07/27/2018   Procedure: CYSTOSCOPY WITH LITHOLAPAXY VIA  SUPRAPUBIC TUBE;  Surgeon: Franchot Gallo, MD;  Location: [facility] ORS;  Service: Urology;  Laterality: N/A;  . CYSTOSCOPY WITH RETROGRADE PYELOGRAM, URETEROSCOPY AND STENT PLACEMENT Left 06/09/2017   Procedure: CYSTOSCOPY WITH LEFT RETROGRADE PYELOGRAM, LEFT URETEROSCOPY, LEFT URETEROSCOPIC STONE EXTRACTION, LEFT URETERAL STENT PLACEMENT;  Surgeon: Franchot Gallo, MD;   Location: [facility] ORS;  Service: Urology;  Laterality: Left;  . CYSTOSCOPY/URETEROSCOPY/HOLMIUM LASER/STENT PLACEMENT Left 05/22/2020   Procedure: CYSTOSCOPY/URETEROSCOPY/STENT PLACEMENT;  Surgeon: Franchot Gallo, MD;  Location: [facility] ORS;  Service: Urology;  Laterality: Left;  . ERCP N/A 03/03/2020   Procedure: ENDOSCOPIC RETROGRADE CHOLANGIOPANCREATOGRAPHY (ERCP);  Surgeon: Rogene Houston, MD;  Location: [facility];  Service: Endoscopy;  Laterality: N/A;  . ERCP N/A 04/20/2020   Procedure: ENDOSCOPIC RETROGRADE CHOLANGIOPANCREATOGRAPHY (ERCP);  Surgeon: Rogene Houston, MD;  Location: [facility];  Service: Endoscopy;  Laterality: N/A;  to be done at 7:30am in OR  . GASTROINTESTINAL STENT REMOVAL N/A 04/20/2020   Procedure: STENT REMOVAL;  Surgeon: Rogene Houston, MD;  Location: [facility];  Service: Endoscopy;  Laterality: N/A;  . INGUINAL HERNIA REPAIR  1971   bilateral  . INSERTION OF SUPRAPUBIC CATHETER  06/09/2017   Procedure: EXCHANGE OF SUPRAPUBIC CATHETER;  Surgeon: Franchot Gallo, MD;  Location: [facility] ORS;  Service: Urology;;  . INSERTION OF SUPRAPUBIC CATHETER  05/22/2020   Procedure: SUPRAPUBIC CATHETER EXCHANGE;  Surgeon: Franchot Gallo, MD;  Location: [facility] ORS;  Service: Urology;;  . IR NEPHROSTOMY PLACEMENT LEFT  05/26/2017  . IR NEPHROSTOMY PLACEMENT LEFT  05/02/2020  . KIDNEY STONE SURGERY  09/13/2015  . LITHOTRIPSY N/A 03/03/2020   Procedure: MECHANICAL LITHOTRIPSY WITH REMOVAL OF  MULTIPLE STONE FRAGMENTS;  Surgeon: Rogene Houston, MD;  Location: [facility];  Service: Endoscopy;  Laterality: N/A;  . NEPHROLITHOTOMY Left 09/13/2015   Procedure: LEFT PERCUTANEOUS NEPHROLITHOTOMY ;  Surgeon: Franchot Gallo, MD;  Location: [facility] ORS;  Service: Urology;  Laterality: Left;  . NEPHROSTOMY TUBE REMOVAL  05/22/2020   Procedure: NEPHROSTOMY TUBE REMOVAL;  Surgeon: Franchot Gallo, MD;  Location: [facility] ORS;  Service: Urology;;  . PORT-A-CATH REMOVAL Right 09/20/2020   Procedure:  MINOR REMOVAL CENTRAL LINE;  Surgeon: Virl Cagey, MD;  Location: [facility] ORS;  Service: General;  Laterality: Right;  Pt to arrive at 7:30am for procedure  . REMOVAL OF STONES N/A 04/20/2020   Procedure: REMOVAL OF STONES;  Surgeon: Rogene Houston, MD;  Location: [facility];  Service: Endoscopy;  Laterality: N/A;  . SPHINCTEROTOMY  03/03/2020   Procedure: BILLARY SPHINCTEROTOMY;  Surgeon: Rogene Houston, MD;  Location: [facility];  Service: Endoscopy;;  . SUPRAPUBIC CATHETER INSERTION        Allergies: Tetracyclines & related and Ciprofloxacin  Medications: Prior to Admission medications   Medication Sig Start Date End Date Taking? Authorizing Provider  diazepam (VALIUM) 5 MG tablet Take 1 tablet (5 mg total) by mouth every 12 (twelve) hours as needed for muscle spasms. 11/20/20  Yes Suzzanne Cloud, NP  HYDROcodone-acetaminophen (NORCO) 7.5-325 MG per tablet Take 1 tablet by mouth 2 (two) times daily. Max APAP 3 GM IN 24 HOURS FROM ALL SOURCES Patient taking differently: Take 1 tablet by mouth in the morning and at bedtime. ALL MEDICATIONS TO BE CRUSHED AND PLACED IN APPLESAUCE 08/22/14  Yes Pandey, Mahima, MD  levothyroxine (SYNTHROID) 88 MCG tablet Take 88 mcg by mouth daily. 12/31/20  Yes [provider]  OXYGEN Inhale 1.5 L into the lungs every evening.   Yes [provider]  sulfamethoxazole-trimethoprim (BACTRIM DS) 800-160 MG tablet Take 1 tablet twice a day starting 2 days before catheter change. Patient not taking: No sig reported 11/22/20   Franchot Gallo, MD  cephALEXin (KEFLEX) 250 MG capsule Take by mouth. Patient not taking: No sig reported 10/22/20   [provider]  levothyroxine (SYNTHROID) 75 MCG tablet TAKE ONE TABLET BY MOUTH ONCE DAILY. Patient not taking: No sig reported 11/05/20   Noreene Larsson, NP  meropenem (MERREM) 500 MG injection Inject into the vein. Patient not taking: No sig reported 09/18/20   [provider]      Vital Signs: BP (!) 181/77 (BP Location: Right Arm)   Pulse 66   Temp 97.6 F (36.4 C) (Oral)   Resp 20   Ht 5\' 5"  (1.651 m)   Wt 125 lb (56.7 kg)   SpO2 100%   BMI 20.80 kg/m   Physical Exam awake, alert.  Chest with few bibasilar crackles.  Heart with normal rate, occasional ectopy.  Abdomen soft, few bowel sounds, intact suprapubic catheter draining yellow urine, site mildly tender to palpation.  No lower extremity edema.  Imaging: CT HEAD WO CONTRAST  Result Date: 02/13/2021 CLINICAL DATA:  Speech difficulty history of multiple sclerosis EXAM: CT HEAD WITHOUT CONTRAST TECHNIQUE: Contiguous axial images were obtained from the base of the skull through the vertex without intravenous contrast. COMPARISON:  MRI 10/03/2011, CT brain 10/02/2011 FINDINGS: Brain: No acute territorial infarction, hemorrhage or intracranial mass. Right greater than left fairly extensive white matter disease with some interval progression. Chronic infarcts in the right white matter, right basal ganglia and right thalamus. Mild atrophy. Stable  ventricle size. Vascular: No hyperdense vessels.  Carotid vascular calcification Skull: Normal. Negative for fracture or focal lesion. Sinuses/Orbits: No acute finding. Other: None IMPRESSION: 1. No definite CT evidence for acute intracranial abnormality, however patient is noted to have fairly extensive white matter disease which could potentially obscure acute lesions. MRI follow-up may be obtained as indicated. 2. Atrophy and chronic infarcts in the right white matter, right basal ganglia and right thalamus. Electronically Signed   By: Donavan Foil M.D.   On: 02/13/2021 23:19   CT Renal Stone Study  Result Date: 02/13/2021 CLINICAL DATA:  Left-sided flank pain EXAM: CT ABDOMEN AND PELVIS WITHOUT CONTRAST TECHNIQUE: Multidetector CT imaging of the abdomen and pelvis was performed following the standard protocol without IV contrast. COMPARISON:  CT 11/30/2020, 09/05/2020  FINDINGS: Lower chest: Lung bases demonstrate pulmonary fibrosis. No acute consolidation or pleural effusion. Hepatobiliary: Status post cholecystectomy. Decreased pneumobilia. Dilated extrahepatic common bile duct measuring up to 15 mm. Pancreas: Unremarkable. No pancreatic ductal dilatation or surrounding inflammatory changes. Spleen: Normal in size without focal abnormality. Adrenals/Urinary Tract: Adrenal glands are normal. Atrophic right kidney with intrarenal vascular calcifications and nonobstructing stones measuring up to 5 mm. Probable small cysts in the right kidney. Severe left-sided hydronephrosis, worse when compared to most recent prior, now also with increased perinephric fat stranding. Left renal cysts. Ureteral stent with proximal pigtail in the renal pelvis and distal pigtail in the bladder. Marked hydroureter which is also increased. Multiple stone fragments in the urinary bladder. Suprapubic catheter is in place. Stone lower pole left kidney measuring 13 mm. Stomach/Bowel: The stomach is nonenlarged. There is no dilated small bowel. No acute bowel [place] thickening. Postsurgical changes at the sigmoid colon. Vascular/Lymphatic: Advanced aortic atherosclerosis. No aneurysm. Tapered and narrowed appearance of the distal aorta and bifurcation of the iliac vessels. No suspicious nodes. Reproductive: Prostate is unremarkable. Other: Negative for free air or free fluid. Anterior abdominal [place] laxity with bulging mesenteric fat and bowel. Musculoskeletal: No acute or suspicious osseous abnormality. IMPRESSION: 1. Severe left-sided hydronephrosis and hydroureter, worsened as compared with 11/30/2020, now with moderate left perinephric edema and soft tissue stranding. 2. There are bilateral intrarenal calculi. There are multiple small stones within the urinary bladder. 3. Atrophic right kidney 4. Pulmonary fibrosis at the lung bases. Electronically Signed   By: Donavan Foil M.D.   On: 02/13/2021 19:30     Labs:  CBC: Recent Labs    09/11/20 0513 02/13/21 1837 02/14/21 0426 02/15/21 0413  WBC 7.3 10.8* 14.8* 9.7  HGB 8.3* 13.4 13.7 12.5*  HCT 27.3* 41.6 42.6 40.9  PLT 256 235 186 164    COAGS: Recent Labs    03/03/20 0536 05/02/20 1718 02/15/21 0906  INR 1.1 1.0 1.2  APTT  --  28  --     BMP: Recent Labs    05/04/20 0504 05/05/20 0544 05/06/20 0556 05/07/20 0521 07/03/20 1401 09/11/20 0513 02/13/21 1837 02/14/21 0426 02/15/21 0413  NA 144 140 140 134*   < > 137 138 145 153*  K 2.9* 3.0* 3.9 3.3*   < > 3.5 3.0* 3.8 3.9  CL 109 105 104 98   < > 103 100 110 121*  CO2 25 24 26 25    < > 27 27 26 27   GLUCOSE 92 118* 101* 103*   < > 94 127* 100* 89  BUN 30* 24* 20 25*   < > 26* 43* 44* 51*  CALCIUM 8.2* 8.3* 8.6* 8.6*   < >  8.9 9.0 9.4 8.9  CREATININE 2.09* 1.54* 1.37* 1.74*   < > 2.35* 3.23* 3.33* 3.16*  GFRNONAA 30* 43* 49* 37*   < > 28* 19* 18* 19*  GFRAA 34* 50* 57* 43*  --   --   --   --   --    < > = values in this interval not displayed.    LIVER FUNCTION TESTS: Recent Labs    07/10/20 0414 07/11/20 0423 07/12/20 0426 09/11/20 0513 02/13/21 1837  BILITOT 0.4 0.7 0.5  --  0.6  AST 19 17 16   --  21  ALT 19 19 21   --  12  ALKPHOS 62 62 58  --  115  PROT 5.4* 5.8* 5.4*  --  8.1  ALBUMIN 2.3* 2.4* 2.3* 2.7* 4.0    Assessment and Plan: Patient familiar to IR service from left nephrostomy in 2016, HD catheter placement in 2016, left nephrostomy in 2018 and 2021.  He has a past medical history significant for multiple sclerosis with neurogenic bladder and previous suprapubic catheter placement, diabetes, hypothyroidism, sleep apnea, peripheral vascular disease, hyperlipidemia, hypertension, left ureteral stent placement in December 2021, dilated cardiomyopathy with diastolic CHF, COPD/pulmonary fibrosis, chronic kidney disease, coronary artery disease, stroke, and recurrent urolithiasis with gram-negative bacteremia.  He recently presented to [facility]  ED with left flank pain, worsening renal function, stuttering speech and chills.  Urinalysis revealed large hemoglobin with large leukocytes.  Urine culture with multiple species.  CT scan revealed severe left-sided hydronephrosis and hydroureter with moderate left perinephric edema and soft tissue stranding, bilateral intrarenal calculi, multiple small stones within the bladder, atrophic right kidney.  Following evaluation by urology it was recommended that patient be transferred to [facility] for left nephrostomy placement;  latest labs include WBC 9.7, hemoglobin 12.5, platelets 164K, creatinine 3.16, potassium 3.9.  Patient currently on Zosyn.  Latest imaging studies have been reviewed by Dr.Henn. Risks and benefits of left PCN placement was discussed with the patient including, but not limited to, infection, bleeding, significant bleeding causing loss or decrease in renal function or damage to adjacent structures.   All of the patient's questions were answered, patient is agreeable to proceed.  Consent signed and in chart.  Procedure scheduled for today    Electronically Signed: D. Rowe Robert, PA-C 02/15/2021, 9:47 AM   I spent a total of 25 minutes at the the patient's bedside AND on the patient's hospital floor or unit, greater than 50% of which was counseling/coordinating care for left percutaneous nephrostomy placement    Patient ID: GURTEJ NOYOLA, male   DOB: [birth year], 80 y.o.   MRN: [id number]

## 2021-02-15 NOTE — Consult Note (Signed)
   [facility] CM Inpatient Consult   02/15/2021  Jordan Ward 11/27/1940 [id number]   Referral received from [facility], inpatient [facility], for possible [facility] post hospital follow up for chronic disease and care management services. Patient has high unplanned readmission risk score, 22%.  Plan: Will continue to follow for progression and disposition plans.  Of note, [facility] Care Management services does not replace or interfere with any services that are arranged by inpatient case management or social work.  [name], MSN, [facility] Liaison Nurse Mobile Phone [phone]  Toll free office [phone]

## 2021-02-16 DIAGNOSIS — N1330 Unspecified hydronephrosis: Secondary | ICD-10-CM | POA: Diagnosis not present

## 2021-02-16 LAB — BASIC METABOLIC PANEL
Anion gap: 6 (ref 5–15)
BUN: 36 mg/dL — ABNORMAL HIGH (ref 8–23)
CO2: 24 mmol/L (ref 22–32)
Calcium: 8.3 mg/dL — ABNORMAL LOW (ref 8.9–10.3)
Chloride: 113 mmol/L — ABNORMAL HIGH (ref 98–111)
Creatinine, Ser: 2.6 mg/dL — ABNORMAL HIGH (ref 0.61–1.24)
GFR, Estimated: 25 mL/min — ABNORMAL LOW (ref >60)
Glucose, Bld: 125 mg/dL — ABNORMAL HIGH (ref 70–99)
Potassium: 2.9 mmol/L — ABNORMAL LOW (ref 3.5–5.1)
Sodium: 143 mmol/L (ref 135–145)

## 2021-02-16 LAB — GLUCOSE, CAPILLARY
Glucose-Capillary: 104 mg/dL — ABNORMAL HIGH (ref 70–99)
Glucose-Capillary: 106 mg/dL — ABNORMAL HIGH (ref 70–99)
Glucose-Capillary: 125 mg/dL — ABNORMAL HIGH (ref 70–99)
Glucose-Capillary: 131 mg/dL — ABNORMAL HIGH (ref 70–99)

## 2021-02-16 MED ORDER — LACTATED RINGERS IV SOLN: INTRAVENOUS | Status: DC

## 2021-02-16 MED ORDER — SODIUM CHLORIDE 0.9% FLUSH: 10.0000 mL | INTRAVENOUS | Status: DC | PRN

## 2021-02-16 MED ORDER — AMLODIPINE BESYLATE 5 MG PO TABS
5.0000 mg | ORAL_TABLET | Freq: Every day | ORAL | Status: DC
  Administered 2021-02-16 – 2021-02-17 (×2): 5 mg via ORAL
  Filled 2021-02-16 (×2): qty 1

## 2021-02-16 MED ORDER — POTASSIUM CHLORIDE CRYS ER 20 MEQ PO TBCR
40.0000 meq | EXTENDED_RELEASE_TABLET | ORAL | Status: AC
  Administered 2021-02-16 (×2): 40 meq via ORAL
  Filled 2021-02-16 (×2): qty 2

## 2021-02-16 MED ORDER — SODIUM CHLORIDE 0.9% FLUSH: 10.0000 mL | Freq: Two times a day (BID) | INTRAVENOUS | Status: DC

## 2021-02-16 NOTE — Plan of Care (Signed)
  Problem: Urinary Elimination: Goal: Signs and symptoms of infection will decrease Outcome: Progressing   Problem: Clinical Measurements: Goal: Will remain free from infection Outcome: Progressing   Problem: Pain Managment: Goal: General experience of comfort will improve Outcome: Progressing   Problem: Skin Integrity: Goal: Risk for impaired skin integrity will decrease Outcome: Progressing   Problem: Nutrition: Goal: Adequate nutrition will be maintained Outcome: Progressing

## 2021-02-16 NOTE — Evaluation (Signed)
Physical Therapy Evaluation Patient Details Name: Jordan Ward MRN: [id number] DOB: 1941/09/13 Today's Date: 02/16/2021   History of Present Illness  80 yo male presents to ED on 5/18 with L flank pain. CT renal shows Severe left-sided hydronephrosis and hydroureter. s/p Left nephrostomy tube placement and right arm PICC line placement on 5/20. 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  Clinical Impression   Pt presents with generalized weakness, impaired balance, increased time and effort to mobilize, and decreased activity tolerance. Pt to benefit from acute PT to address deficits. Pt ambulated hallway distance with use of RW and close guard for safety. Pt is a high fall risk, PT feels pt would benefit from post-acute therapy but per pt he just finished with HHPT. Pt may want to consider OPPT if pt's wife is able to take him to/from appointments. PT to progress mobility as tolerated, and will continue to follow acutely.      Follow Up Recommendations Home health PT;Supervision for mobility/OOB (vs OPPT to address deficits, pt states he just finished HHPT stent)    Equipment Recommendations  None recommended by PT    Recommendations for Other Services       Precautions / Restrictions Precautions Precautions: Fall Precaution Comments: L nephrostomy Restrictions Weight Bearing Restrictions: No      Mobility  Bed Mobility Overal bed mobility: Needs Assistance             General bed mobility comments: pt sitting EOB upon PT arrival to room    Transfers Overall transfer level: Needs assistance Equipment used: Rolling walker (2 wheeled) Transfers: Sit to/from Stand Sit to Stand: Min guard         General  transfer comment: for safety, slow to rise and steady.  Ambulation/Gait Ambulation/Gait assistance: Min guard Gait Distance (Feet): 150 Feet Assistive device: Rolling walker (2 wheeled) Gait Pattern/deviations: Step-through pattern;Decreased stride length;Trunk flexed Gait velocity: decr   General Gait Details: min guard for safety, verbal cuing for upright posture, placement in RW. Increased time and effort, SPo2 95% on RA immediately post-gait.  Stairs            Wheelchair Mobility    Modified Rankin (Stroke Patients Only)       Balance Overall balance assessment: Needs assistance;History of Falls Sitting-balance support: No upper extremity supported;Feet supported Sitting balance-Leahy Scale: Fair Sitting balance - Comments: able to sit EOB without PT support   Standing balance support: Bilateral upper extremity supported;During functional activity Standing balance-Leahy Scale: Poor Standing balance comment: reliant on external assist                             Pertinent Vitals/Pain Pain Assessment: Faces Faces Pain Scale: Hurts a little bit Pain Location: nephrostomy site Pain Descriptors / Indicators: Discomfort Pain Intervention(s): Limited activity within patient's tolerance;Monitored during session    Home Living Family/patient expects to be discharged to:: Private residence Living Arrangements: Spouse/significant other;Children Available Help at Discharge: Family Type of Home: House Home Access: Stairs to enter Entrance Stairs-Rails:  (can reach handhold) Entrance Stairs-Number of Steps: 2 Home Layout: One level Home Equipment: Walker - 2 wheels;Grab bars - toilet;Bedside commode      Prior Function Level of Independence: Needs assistance   Gait /  Transfers Assistance Needed: pt reports using RW for ambulation  ADL's / Homemaking Assistance Needed: Pt washes up at sink, does not get into tub shower. Pt's wife assists pt with dressing,  bathing, meal prep        Hand Dominance   Dominant Hand: Left    Extremity/Trunk Assessment   Upper Extremity Assessment Upper Extremity Assessment: Defer to OT evaluation    Lower Extremity Assessment Lower Extremity Assessment: Generalized weakness    Cervical / Trunk Assessment Cervical / Trunk Assessment: Kyphotic  Communication   Communication: No difficulties  Cognition Arousal/Alertness: Awake/alert Behavior During Therapy: Flat affect Overall Cognitive Status: No family/caregiver present to determine baseline cognitive functioning                                 General Comments: Very flat affect, increased processing time to respond to subjective questions/mobility cues      General Comments General comments (skin integrity, edema, etc.): VSS    Exercises     Assessment/Plan    PT Assessment Patient needs continued PT services  PT Problem List Decreased strength;Decreased mobility;Decreased activity tolerance;Decreased balance;Decreased knowledge of use of DME       PT Treatment Interventions DME instruction;Therapeutic activities;Gait training;Therapeutic exercise;Patient/family education;Balance training;Functional mobility training;Stair training;Neuromuscular re-education    PT Goals (Current goals can be found in the Care Plan section)  Acute Rehab PT Goals Patient Stated Goal: go home PT Goal Formulation: With patient Time For Goal Achievement: 03/02/21 Potential to Achieve Goals: Good    Frequency Min 3X/week   Barriers to discharge        Co-evaluation               AM-PAC PT "6 Clicks" Mobility  Outcome Measure Help needed turning from your back to your side while in a flat bed without using bedrails?: A Little Help needed moving from lying on your back to sitting on the side of a flat bed without using bedrails?: A Little Help needed moving to and from a bed to a chair (including a wheelchair)?: A Little Help  needed standing up from a chair using your arms (e.g., wheelchair or bedside chair)?: A Little Help needed to walk in hospital room?: A Little Help needed climbing 3-5 steps with a railing? : A Little 6 Click Score: 18    End of Session   Activity Tolerance: Patient tolerated treatment well Patient left: in chair;with call bell/phone within reach;with chair alarm set;with family/visitor present Nurse Communication: Mobility status PT Visit Diagnosis: Other abnormalities of gait and mobility (R26.89);Difficulty in walking, not elsewhere classified (R26.2)    Time: 9563-8756 PT Time Calculation (min) (ACUTE ONLY): 21 min   Charges:   PT Evaluation $PT Eval Low Complexity: 1 Low         Laurali Goddard S, PT DPT Acute Rehabilitation Services Pager [phone]  Office [phone]  Roxine Caddy E Ruffin Pyo 02/16/2021, 3:25 PM

## 2021-02-16 NOTE — Progress Notes (Addendum)
PROGRESS NOTE    AKI ABALOS   FAO:[id number]  DOB: 1941/03/06  PCP: Valentino Nose, FNP    DOA: 02/13/2021 LOS: 3   Brief Narrative   Jordan Ward is a 80 y.o. male with extensive past medical history which includes multiple sclerosis, suprapubic catheter, dilated cardiomyopathy with diastolic CHF, pulmonary fibrosis, CKD 3, gram-negative bacteremia, coronary artery disease, stroke, recurrent urolithiasis who presented to [facility] ED with complaints of left flank pain and chills.  Urology tele-visit the day before, placement of nephrostomy tube was recommended.      Patient has a complicated urologic history which includes presence of a suprapubic catheter, left ureteric stent placement 09/06/2020, gram-negative sepsis secondary to polymicrobial organisms including Enterococcus faecalis, Pseudomonas and Morganella.   ED Course: Temp Max 99.7, with stable vitals, intermittently hypertensive.  Labs notable for worsened renal function with creatinine 3.23 from last check 2.34.  WBC 10.8k.  Potassium 3.  UA consistent with infection.  CT renal stone protocol showed severe left-sided hydronephrosis and hydroureter worsened as compared to 11/30/2020, now with moderate left perinephric edema and soft tissue stranding.   EDP contacted Dr. [name] who recommended admission to [facility] with plan for intervention.    Assessment & Plan   Principal Problem:   Hydronephrosis, left Active Problems:   Acute kidney injury superimposed on CKD (HCC)   Pulmonary fibrosis (HCC)   Dilated cardiomyopathy (HCC)   Chronic diastolic (congestive) heart failure (HCC)   Essential hypertension   Multiple sclerosis (HCC)   Chronic suprapubic catheter (HCC)   Tremors of nervous system   Hypothyroidism   Pressure injury of skin   Left hydronephrosis -progressive since imaging on 11/30/2020, with moderate left perinephric edema and soft tissue stranding concerning for infection. Percutaneous nephrostomy tube  placed by IR 5/20. -- Supportive care: Pain management, antiemetics as needed  AKI superimposed on CKD stage IIIb -presented with creatinine 3.23, baseline unclear but appears was optimized in December 2021 creatinine to 2.34. Suspect multifactorial with poor oral intake and obstructive uropathy contributing. 5/21: Creatinine improved 3.16 >> 2.60 -- Continue gentle IV hydration, changed to LR today -- Monitor BMP  UTI in setting of chronic suprapubic catheter for neurogenic bladder -present with low-grade temp 90 9.1F, mild leukocytosis, intermittent rigors.  UA with large leukocytes, negative nitrite, rare bacteria, 21-50 WBCs.  Perinephric edema and stranding seen on CT, consistent with pyelonephritis.  Not septic. Urine culture grew multiple species, likely contaminated. --Stop Zosyn and monitor -- Resume antibiotics if recurrent s/sx's of infection  HypERnatremia - Na 153 on 5/20.  Resolved with running D5-1/2NS for a day, stopped.   --Monitor BMP  Difficult vascular access -IR placed PICC line 5/20  Multiple sclerosis -with baseline tremors.  New onset intermittent speech stuttering since admission which patient states is how his MS initially presented.  He has no focal neurologic deficits.  Noncontrast head CT negative for anything acute. -- Continue home Valium  Hypokalemia -presented with K3.0, replaced. 5/21: K dropped 3.9 >> 2.9.   -- Replacing with oral 34 M EQ x2 -- Monitor BMP, further replacement as indicated  Chronic respiratory failure with hypoxia -at baseline uses 1.5 L/min at night  Type 2 diabetes -controlled.   Stopped fingersticks and sliding scale as CBGs have been at goal.  Hemoglobin A1c 5.9%.  Dilated cardiomyopathy -last echo December 2021 with EF 50 to 69%, grade 1 diastolic dysfunction. -- Monitor volume status while on IV hydration asthma  Hypothyroidism -on Synthroid  COPD,  pulmonary fibrosis -stable without acute exacerbation.  As needed  bronchodilators  Patient BMI: Body mass index is 20.8 kg/m.   DVT prophylaxis: SCDs Start: 02/14/21 0044   Diet:  Diet Orders (From admission, onward)    Start     Ordered   02/15/21 1508  Diet Heart Room service appropriate? Yes; Fluid consistency: Thin  Diet effective now       Question Answer Comment  Room service appropriate? Yes   Fluid consistency: Thin      02/15/21 1508            Code Status: Full Code    Subjective 02/16/21    Patient awake sitting up in bed when seen today.  He reports having a little pain in his left flank at the site of new nephrostomy.  Denies any fevers or chills.  Asks about blood pressure medication and potassium given today.  States he just had finished home health therapy and does not wish to do anymore right now.  Discussed readiness for discharge and patient reported his wife is out of town, plan to return tomorrow.  She is patient's caregiver, thus patient is unable to discharge home today.   Disposition Plan & Communication   Status is: Inpatient  Remains inpatient appropriate because: Unsafe discharge.  Patient's wife is out of town until tomorrow and is patient's caregiver.   Dispo: The patient is from: Home              Anticipated d/c is to: Home              Patient currently is not medically stable to d/c.   Difficult to place patient No   Family Communication: None at bedside, will attempt to call   Consults, Procedures, Significant Events   Consultants:   Urology, Dr. [name]  Procedures:   Plan for today  Antimicrobials:  Anti-infectives (From admission, onward)   Start     Dose/Rate Route Frequency Ordered Stop   02/14/21 1800  piperacillin-tazobactam (ZOSYN) IVPB 2.25 g  Status:  Discontinued        2.25 g 100 mL/hr over 30 Minutes Intravenous Every 6 hours 02/14/21 1420 02/16/21 1004   02/14/21 0000  piperacillin-tazobactam (ZOSYN) IVPB 2.25 g  Status:  Discontinued        2.25 g 100 mL/hr over 30  Minutes Intravenous Every 6 hours 02/13/21 2314 02/14/21 1420        Micro    Objective   Vitals:   02/15/21 1452 02/15/21 2005 02/16/21 0509 02/16/21 1425  BP: (!) 155/92 (!) 153/82 (!) 163/84 (!) 145/82  Pulse: 71 72 (!) 58 66  Resp: 18 16 16 17   Temp: 98.1 F (36.7 C) 98.2 F (36.8 C)  98.1 F (36.7 C)  TempSrc: Oral Oral  Oral  SpO2: 100% 100% 100% 99%  Weight:      Height:        Intake/Output Summary (Last 24 hours) at 02/16/2021 1511 Last data filed at 02/16/2021 1246 Gross per 24 hour  Intake 2065.38 ml  Output 1900 ml  Net 165.38 ml   Filed Weights   02/13/21 1708  Weight: 56.7 kg    Physical Exam:  General exam: awake, alert, no acute distress, underweight Respiratory system: CTAB, no wheezes, rales or rhonchi, normal respiratory effort. Cardiovascular system: normal S1/S2, RRR, no pedal edema.   Genitourinary: new L nephrostomy in place with pale yellow urine in tubing and bag, suprapubic cath present Central nervous system:  A&O x3.  Normal speech. Grossly nonfocal exam. Psychiatry: normal mood, congruent affect, judgement and insight appear normal  Labs   Data Reviewed: I have personally reviewed following labs and imaging studies  CBC: Recent Labs  Lab 02/13/21 1837 02/14/21 0426 02/15/21 0413  WBC 10.8* 14.8* 9.7  NEUTROABS 8.9*  --   --   HGB 13.4 13.7 12.5*  HCT 41.6 42.6 40.9  MCV 93.5 92.2 96.7  PLT 235 186 417   Basic Metabolic Panel: Recent Labs  Lab 02/13/21 1837 02/14/21 0426 02/15/21 0413 02/16/21 0611  NA 138 145 153* 143  K 3.0* 3.8 3.9 2.9*  CL 100 110 121* 113*  CO2 27 26 27 24   GLUCOSE 127* 100* 89 125*  BUN 43* 44* 51* 36*  CREATININE 3.23* 3.33* 3.16* 2.60*  CALCIUM 9.0 9.4 8.9 8.3*  MG 2.1  --   --   --    GFR: Estimated Creatinine Clearance: 19.1 mL/min (A) (by C-G formula based on SCr of 2.6 mg/dL (H)). Liver Function Tests: Recent Labs  Lab 02/13/21 1837  AST 21  ALT 12  ALKPHOS 115  BILITOT 0.6   PROT 8.1  ALBUMIN 4.0   No results for input(s): LIPASE, AMYLASE in the last 168 hours. No results for input(s): AMMONIA in the last 168 hours. Coagulation Profile: Recent Labs  Lab 02/15/21 0906  INR 1.2   Cardiac Enzymes: No results for input(s): CKTOTAL, CKMB, CKMBINDEX, TROPONINI in the last 168 hours. BNP (last 3 results) No results for input(s): PROBNP in the last 8760 hours. HbA1C: Recent Labs    02/14/21 0641  HGBA1C 5.9*   CBG: Recent Labs  Lab 02/15/21 0513 02/15/21 1130 02/15/21 2003 02/16/21 0507 02/16/21 1133  GLUCAP 103* 134* 157* 131* 104*   Lipid Profile: No results for input(s): CHOL, HDL, LDLCALC, TRIG, CHOLHDL, LDLDIRECT in the last 72 hours. Thyroid Function Tests: No results for input(s): TSH, T4TOTAL, FREET4, T3FREE, THYROIDAB in the last 72 hours. Anemia Panel: No results for input(s): VITAMINB12, FOLATE, FERRITIN, TIBC, IRON, RETICCTPCT in the last 72 hours. Sepsis Labs: Recent Labs  Lab 02/13/21 1837  LATICACIDVEN 1.9    Recent Results (from the past 240 hour(s))  Blood culture (routine x 2)     Status: None (Preliminary result)   Collection Time: 02/13/21  6:37 PM   Specimen: BLOOD  Result Value Ref Range Status   Specimen Description BLOOD BLOOD LEFT ARM  Final   Special Requests   Final    Blood Culture results may not be optimal due to an inadequate volume of blood received in culture bottles BOTTLES DRAWN AEROBIC AND ANAEROBIC   Culture   Final    NO GROWTH 3 DAYS Performed at [facility], [address], [place], [place] [address]    Report Status PENDING  Incomplete  Blood culture (routine x 2)     Status: None (Preliminary result)   Collection Time: 02/13/21  6:37 PM   Specimen: BLOOD  Result Value Ref Range Status   Specimen Description BLOOD BLOOD RIGHT ARM  Final   Special Requests   Final    Blood Culture adequate volume BOTTLES DRAWN AEROBIC AND ANAEROBIC   Culture   Final    NO GROWTH 3 DAYS Performed at [facility], [address] [place], [place] [address]    Report Status PENDING  Incomplete  Resp Panel by RT-PCR (Flu A&B, Covid) Nasopharyngeal Swab     Status: None   Collection Time: 02/13/21  8:29 PM   Specimen: Nasopharyngeal Swab; Nasopharyngeal(NP) swabs in vial transport medium  Result Value Ref Range Status   SARS Coronavirus 2 by RT PCR NEGATIVE NEGATIVE Final    Comment: (NOTE) SARS-CoV-2 target nucleic acids are NOT DETECTED.  The SARS-CoV-2 RNA is generally detectable in upper respiratory specimens during the acute phase of infection. The lowest concentration of SARS-CoV-2 viral copies this assay can detect is 138 copies/mL. A negative result does not preclude SARS-Cov-2 infection and should not be used as the sole basis for treatment or other patient management decisions. A negative result may occur with  improper specimen collection/handling, submission of specimen other than nasopharyngeal swab, presence of viral mutation(s) within the areas targeted by this assay, and inadequate number of viral copies(<138 copies/mL). A negative result must be combined with clinical observations, patient history, and epidemiological information. The expected result is Negative.  Fact Sheet for Patients:  EntrepreneurPulse.com.au  Fact Sheet for Healthcare Providers:  IncredibleEmployment.be  This test is no t yet approved or cleared by the [place] FDA and  has been authorized for detection and/or diagnosis of SARS-CoV-2 by FDA under an Emergency Use Authorization (EUA). This EUA will remain  in effect (meaning this test can be used) for the duration of the COVID-19 declaration under Section 564(b)(1) of the Act, 21 U.S.C.section 360bbb-3(b)(1), unless the authorization is terminated  or revoked sooner.       Influenza A by PCR NEGATIVE NEGATIVE Final   Influenza B by PCR NEGATIVE NEGATIVE Final    Comment: (NOTE) The Xpert Xpress  SARS-CoV-2/FLU/RSV plus assay is intended as an aid in the diagnosis of influenza from Nasopharyngeal swab specimens and should not be used as a sole basis for treatment. Nasal washings and aspirates are unacceptable for Xpert Xpress SARS-CoV-2/FLU/RSV testing.  Fact Sheet for Patients: EntrepreneurPulse.com.au  Fact Sheet for Healthcare Providers: IncredibleEmployment.be  This test is not yet approved or cleared by the [place] FDA and has been authorized for detection and/or diagnosis of SARS-CoV-2 by FDA under an Emergency Use Authorization (EUA). This EUA will remain in effect (meaning this test can be used) for the duration of the COVID-19 declaration under Section 564(b)(1) of the Act, 21 U.S.C. section 360bbb-3(b)(1), unless the authorization is terminated or revoked.  Performed at [facility], [address] [place], [place] [address]   Culture, Urine     Status: Abnormal   Collection Time: 02/13/21 10:54 PM   Specimen: Urine, Clean Catch  Result Value Ref Range Status   Specimen Description   Final    URINE, CLEAN CATCH Performed at [facility], [address] [place], [place] [address]    Special Requests   Final    NONE Performed at [facility], [address], [place], [place] [address]    Culture MULTIPLE SPECIES PRESENT, SUGGEST RECOLLECTION (A)  Final   Report Status 02/15/2021 FINAL  Final      Imaging Studies   IR NEPHROSTOMY PLACEMENT LEFT  Result Date: 02/15/2021 INDICATION: 80 year old with neurogenic bladder, suprapubic tube and right renal atrophy. Patient has left hydronephrosis despite having a ureter stent. Patient needs a percutaneous nephrostomy tube. EXAM: LEFT PERCUTANEOUS NEPHROSTOMY TUBE WITH ULTRASOUND AND FLUOROSCOPIC GUIDANCE COMPARISON:  None. MEDICATIONS: Inpatient antibiotics were given prior to the procedure. ANESTHESIA/SEDATION: Fentanyl 100 mcg IV; Versed 3.0 mg IV Moderate Sedation Time:  17  minutes The patient was continuously monitored during the procedure by the interventional radiology nurse under my direct supervision. CONTRAST:  5 mL Omnipaque  300-administered into the collecting system(s) FLUOROSCOPY TIME:  Fluoroscopy Time: 1 minutes, 12 seconds, 5 mGy COMPLICATIONS: None immediate. PROCEDURE: Informed written consent was obtained from the patient after a thorough discussion of the procedural risks, benefits and alternatives. All questions were addressed. A timeout was performed prior to the initiation of the procedure. Patient was placed prone. The left flank was prepped and draped in sterile fashion. Maximal barrier sterile technique was utilized including caps, mask, sterile gowns, sterile gloves, sterile drape, hand hygiene and skin antiseptic. Ultrasound demonstrated left hydronephrosis. Skin was anesthetized using 1% lidocaine. Small incision was made. Using ultrasound guidance, 21 gauge needle was directed into a dilated mid pole calyx. Wire was easily advanced into the collecting system. Transitional dilator set was placed. No significant fluid could be aspirated. Bentson wire was advanced into the renal pelvis and the tract was dilated to accommodate a 10 [place] multipurpose drain. Cloudy urine was aspirated. Contrast injection confirmed placement in the renal pelvis. Catheter was attached to bag and sutured to the skin. Dressing was placed. Urine sample was sent for culture. Fluoroscopic images were taken and saved for this procedure. FINDINGS: Moderate to severe left hydronephrosis. Nephrostomy tube was placed via a mid pole calyx. Catheter was coiled in the renal pelvis. There is also a ureter stent in the renal pelvis. IMPRESSION: Successful placement of a left percutaneous nephrostomy tube using ultrasound and fluoroscopic guidance. Electronically Signed   By: Markus Daft M.D.   On: 02/15/2021 17:09   IR PICC PLACEMENT RIGHT >5 YRS INC IMG GUIDE  Result Date:  02/15/2021 INDICATION: 80 year old with left hydronephrosis and poor venous access. Patient is scheduled for a percutaneous nephrostomy tube and needs venous access for the procedure. EXAM: RIGHT ARM PICC LINE PLACEMENT WITH ULTRASOUND AND FLUOROSCOPIC GUIDANCE MEDICATIONS: None ANESTHESIA/SEDATION: None FLUOROSCOPY TIME:  Fluoroscopy Time: 48 seconds, 2 mGy COMPLICATIONS: None immediate. PROCEDURE: The patient was advised of the possible risks and complications and agreed to undergo the procedure. The patient was then brought to the angiographic suite for the procedure. The right arm was prepped with chlorhexidine, draped in the usual sterile fashion using maximum barrier technique (cap and mask, sterile gown, sterile gloves, large sterile sheet, hand hygiene and cutaneous antiseptic). Local anesthesia was attained by infiltration with 1% lidocaine. Ultrasound demonstrated patency of the right brachial vein, and this was documented with an image. Under real-time ultrasound guidance, this vein was accessed with a 21 gauge micropuncture needle and image documentation was performed. The needle was exchanged over a guidewire for a peel-away sheath through which a 37 cm 5 [place] dual lumen power injectable PICC was advanced, and positioned with its tip at the lower SVC/right atrial junction. Fluoroscopy during the procedure and fluoro spot radiograph confirms appropriate catheter position. The catheter was flushed, secured to the skin, and covered with a sterile dressing. IMPRESSION: Successful placement of a right arm PICC with sonographic and fluoroscopic guidance. The catheter is ready for use. Electronically Signed   By: Markus Daft M.D.   On: 02/15/2021 16:41     Medications   Scheduled Meds: . amLODipine  5 mg Oral Daily  . Chlorhexidine Gluconate Cloth  6 each Topical Daily  . levothyroxine  88 mcg Oral Q0600  . mouth rinse  15 mL Mouth Rinse BID  . sodium chloride flush  10-40 mL Intracatheter Q12H    Continuous Infusions: . lactated ringers 75 mL/hr at 02/16/21 0832       LOS: 3 days  Time spent: 25 minutes with > 50% spent at bedside and in coordination of care.    Ezekiel Slocumb, DO [facility]  02/16/2021, 3:11 PM      If 7PM-7AM, please contact night-coverage. How to contact the [facility] Attending or Consulting provider [place] or covering provider during after hours [place], for this patient?    1. Check the care team in [facility] and look for a) attending/consulting [facility] provider listed and b) the [facility] team listed 2. Log into [email or web address] and use [place]'s universal password to access. If you do not have the password, please contact the hospital operator. 3. Locate the [facility] provider you are looking for under [facility] and page to a number that you can be directly reached. 4. If you still have difficulty reaching the provider, please page the [facility] (Director on Call) for the Hospitalists listed on amion for assistance.

## 2021-02-17 DIAGNOSIS — N1330 Unspecified hydronephrosis: Secondary | ICD-10-CM | POA: Diagnosis not present

## 2021-02-17 LAB — BASIC METABOLIC PANEL
Anion gap: 8 (ref 5–15)
BUN: 30 mg/dL — ABNORMAL HIGH (ref 8–23)
CO2: 24 mmol/L (ref 22–32)
Calcium: 8.6 mg/dL — ABNORMAL LOW (ref 8.9–10.3)
Chloride: 112 mmol/L — ABNORMAL HIGH (ref 98–111)
Creatinine, Ser: 2.37 mg/dL — ABNORMAL HIGH (ref 0.61–1.24)
GFR, Estimated: 28 mL/min — ABNORMAL LOW (ref >60)
Glucose, Bld: 103 mg/dL — ABNORMAL HIGH (ref 70–99)
Potassium: 3.5 mmol/L (ref 3.5–5.1)
Sodium: 144 mmol/L (ref 135–145)

## 2021-02-17 LAB — GLUCOSE, CAPILLARY: Glucose-Capillary: 109 mg/dL — ABNORMAL HIGH (ref 70–99)

## 2021-02-17 LAB — CBC
HCT: 35.1 % — ABNORMAL LOW (ref 39.0–52.0)
Hemoglobin: 10.9 g/dL — ABNORMAL LOW (ref 13.0–17.0)
MCH: 29.4 pg (ref 26.0–34.0)
MCHC: 31.1 g/dL (ref 30.0–36.0)
MCV: 94.6 fL (ref 80.0–100.0)
Platelets: 143 10*3/uL — ABNORMAL LOW (ref 150–400)
RBC: 3.71 MIL/uL — ABNORMAL LOW (ref 4.22–5.81)
RDW: 14.6 % (ref 11.5–15.5)
WBC: 7.6 10*3/uL (ref 4.0–10.5)
nRBC: 0 % (ref 0.0–0.2)

## 2021-02-17 LAB — MAGNESIUM: Magnesium: 1.9 mg/dL (ref 1.7–2.4)

## 2021-02-17 MED ORDER — AMLODIPINE BESYLATE 5 MG PO TABS: 5.0000 mg | ORAL_TABLET | Freq: Every day | ORAL | 2 refills | Status: DC

## 2021-02-17 MED ORDER — HYDROCODONE-ACETAMINOPHEN 7.5-325 MG PO TABS
1.0000 | ORAL_TABLET | Freq: Four times a day (QID) | ORAL | Status: DC | PRN
  Administered 2021-02-17: 1 via ORAL
  Filled 2021-02-17: qty 1

## 2021-02-17 NOTE — Plan of Care (Signed)

## 2021-02-17 NOTE — Discharge Summary (Addendum)
Physician Discharge Summary  Jordan Ward DGL:[id number] DOB: 02-Jun-1941 DOA: 02/13/2021  PCP: Valentino Nose, FNP  Admit date: 02/13/2021 Discharge date: 02/17/2021  Admitted From: home Disposition:  home  Recommendations for Outpatient Follow-up:  Follow up with PCP in 1-2 weeks Please obtain BMP/CBC in one week Follow up with Urology closely, call to schedule  Home Health: PT  Equipment/Devices: None   Discharge Condition: Stable  CODE STATUS: Full  Diet recommendation: regular    Discharge Diagnoses: Principal Problem:   Hydronephrosis, left Active Problems:   Acute kidney injury superimposed on CKD ([place])   Pulmonary fibrosis (HCC)   Dilated cardiomyopathy ([place])   Chronic diastolic (congestive) heart failure ([place])   Essential hypertension   Multiple sclerosis ([place])   Chronic suprapubic catheter ([employer])   Tremors of nervous system   Hypothyroidism   Pressure injury of skin    Summary of HPI and Hospital Course:  Jordan Ward is a 80 y.o. male with extensive past medical history which includes multiple sclerosis, suprapubic catheter, dilated cardiomyopathy with diastolic CHF, pulmonary fibrosis, CKD 3, gram-negative bacteremia, coronary artery disease, stroke, recurrent urolithiasis who presented to [facility] ED with complaints of left flank pain and chills.  Urology tele-visit the day before, placement of nephrostomy tube was recommended.      Patient has a complicated urologic history which includes presence of a suprapubic catheter, left ureteric stent placement 09/06/2020, gram-negative sepsis secondary to polymicrobial organisms including Enterococcus faecalis, Pseudomonas and Morganella.   ED Course: Temp Max 99.7, with stable vitals, intermittently hypertensive.  Labs notable for worsened renal function with creatinine 3.23 from last check 2.34.  WBC 10.8k.  Potassium 3.  UA consistent with infection.  CT renal stone protocol showed severe left-sided hydronephrosis and  hydroureter worsened as compared to 11/30/2020, now with moderate left perinephric edema and soft tissue stranding.   EDP contacted Dr. [name] who recommended admission to [facility] with plan for intervention.    Left hydronephrosis -POA, noted progressive since imaging on 11/30/2020, with moderate left perinephric edema and soft tissue stranding. Percutaneous nephrostomy tube placed by IR 5/20. --Pain control as needed -- Monitor site closely --Follow up with urology closely   AKI superimposed on CKD stage IIIb - AKI resolved. Presented with creatinine 3.23, baseline unclear but appears was optimized in December 2021 creatinine to 2.34.  Suspect multifactorial with poor oral intake and obstructive uropathy contributing. Cr back to baseline with IV hydration. -- BMP in follow up   UTI in setting of chronic suprapubic catheter for neurogenic bladder -  Presented with low-grade temp 99.56F, mild leukocytosis, intermittent rigors.  UA with large leukocytes, negative nitrite, rare bacteria, 21-50 WBCs. Perinephric edema and stranding seen on CT could be inflammation vs infection.  Not septic. Urine culture grew multiple species, likely contaminated. --Treated with empiric Zosyn which was stopped after results of urine culture reflect colonization.   Patient remain afebrile, asymptomatic and without any evidence of infection off antibiotics.    HypERnatremia - Na 153 on 5/20.  Poor PO intake. Resolved with running D5-1/2NS for a day, stopped.   --Monitor BMP   Difficult vascular access -IR placed PICC line 5/20.  Discontinued prior to d/c.   Multiple sclerosis -with baseline tremors.  New onset intermittent speech stuttering since admission which patient states is how his MS initially presented.  He has no focal neurologic deficits.  Noncontrast head CT negative for anything acute. -- Continue home Valium   Hypokalemia -presented with K3.0, replaced.  Recurred 5/21: K dropped 3.9 >> 2.9,  replaced. -- Monitor BMP in follow up   Chronic respiratory failure with hypoxia -at baseline uses 1.5 L/min at night   Type 2 diabetes -controlled.   Stopped fingersticks and sliding scale as CBGs have been at goal.  Hemoglobin A1c 5.9%.   Dilated cardiomyopathy -last echo December 2021 with EF 50 to 72%, grade 1 diastolic dysfunction. Euvolemic.   Hypothyroidism -on Synthroid   COPD, pulmonary fibrosis -stable without acute exacerbation.  As needed bronchodilators   Pressure injuries of skin Pressure Ulcer 08/03/15 Stage I -  Intact skin with non-blanchable redness of a localized area usually over a bony prominence. Tip of left great toe red (Active)  08/03/15 1425  Location: Toe (Comment  which one)  Location Orientation: Left (left great toe)  Staging: Stage I -  Intact skin with non-blanchable redness of a localized area usually over a bony prominence.  Wound Description (Comments): Tip of left great toe red  Present on Admission:      Pressure Injury 05/26/17 Stage I -  Intact skin with non-blanchable redness of a localized area usually over a bony prominence.  calleous and non-blanchable redness to ankle (Active)  05/26/17 0157  Location: Ankle  Location Orientation: Right  Staging: Stage I -  Intact skin with non-blanchable redness of a localized area usually over a bony prominence.  Wound Description (Comments):  calleous and non-blanchable redness to ankle  Present on Admission: Yes     Pressure Injury 02/14/21 Sacrum Mid Stage 1 -  Intact skin with non-blanchable redness of a localized area usually over a bony prominence. (Active)  02/14/21 0100  Location: Sacrum  Location Orientation: Mid  Staging: Stage 1 -  Intact skin with non-blanchable redness of a localized area usually over a bony prominence.  Wound Description (Comments):   Present on Admission: Yes     Pressure Injury 02/14/21 Ischial tuberosity Posterior;Proximal;Right;Left Stage 1 -  Intact skin with  non-blanchable redness of a localized area usually over a bony prominence. (Active)  02/14/21 0100  Location: Ischial tuberosity  Location Orientation: Posterior;Proximal;Right;Left  Staging: Stage 1 -  Intact skin with non-blanchable redness of a localized area usually over a bony prominence.  Wound Description (Comments):   Present on Admission: Yes        Discharge Instructions   Discharge Instructions     Call MD for:  extreme fatigue   Complete by: As directed    Call MD for:  persistant dizziness or light-headedness   Complete by: As directed    Call MD for:  persistant nausea and vomiting   Complete by: As directed    Call MD for:  severe uncontrolled pain   Complete by: As directed    Call MD for:  temperature >100.4   Complete by: As directed    Diet - low sodium heart healthy   Complete by: As directed    Discharge instructions   Complete by: As directed    Please call Dr. Lindwood Coke office first thing on Monday Morning to schedule follow up visit.   Discharge wound care:   Complete by: As directed    Continue routine pressure injury wound care. Frequently reposition and off-load areas of pressure.   Increase activity slowly   Complete by: As directed       Allergies as of 02/17/2021       Reactions   Tetracyclines & Related Anaphylaxis, Rash   Ciprofloxacin    Trouble swallowing  unknown reaction according to wife         Medication List     STOP taking these medications    cephALEXin 250 MG capsule Commonly known as: KEFLEX   meropenem 500 MG injection Commonly known as: MERREM   sulfamethoxazole-trimethoprim 800-160 MG tablet Commonly known as: BACTRIM DS       TAKE these medications    amLODipine 5 MG tablet Commonly known as: NORVASC Take 1 tablet (5 mg total) by mouth daily. Start taking on: Feb 18, 2021   diazepam 5 MG tablet Commonly known as: VALIUM Take 1 tablet (5 mg total) by mouth every 12 (twelve) hours as needed for  muscle spasms.   HYDROcodone-acetaminophen 7.5-325 MG tablet Commonly known as: NORCO Take 1 tablet by mouth 2 (two) times daily. Max APAP 3 GM IN 24 HOURS FROM ALL SOURCES What changed:  when to take this additional instructions   levothyroxine 88 MCG tablet Commonly known as: SYNTHROID Take 88 mcg by mouth daily. What changed: Another medication with the same name was removed. Continue taking this medication, and follow the directions you see here.   OXYGEN Inhale 1.5 L into the lungs every evening.               Discharge Care Instructions  (From admission, onward)           Start     Ordered   02/17/21 0000  Discharge wound care:       Comments: Continue routine pressure injury wound care. Frequently reposition and off-load areas of pressure.   02/17/21 1028             Allergies  Allergen Reactions   Tetracyclines & Related Anaphylaxis and Rash   Ciprofloxacin     Trouble swallowing unknown reaction according to wife      If you experience worsening of your admission symptoms, develop shortness of breath, life threatening emergency, suicidal or homicidal thoughts you must seek medical attention immediately by calling 911 or calling your MD immediately  if symptoms less severe.    Please note   You were cared for by a hospitalist during your hospital stay. If you have any questions about your discharge medications or the care you received while you were in the hospital after you are discharged, you can call the unit and asked to speak with the hospitalist on call if the hospitalist that took care of you is not available. Once you are discharged, your primary care physician will handle any further medical issues. Please note that NO REFILLS for any discharge medications will be authorized once you are discharged, as it is imperative that you return to your primary care physician (or establish a relationship with a primary care physician if you do not have  one) for your aftercare needs so that they can reassess your need for medications and monitor your lab values.   Consultations: Interventional radiology    Procedures/Studies: CT HEAD WO CONTRAST  Result Date: 02/13/2021 CLINICAL DATA:  Speech difficulty history of multiple sclerosis EXAM: CT HEAD WITHOUT CONTRAST TECHNIQUE: Contiguous axial images were obtained from the base of the skull through the vertex without intravenous contrast. COMPARISON:  MRI 10/03/2011, CT brain 10/02/2011 FINDINGS: Brain: No acute territorial infarction, hemorrhage or intracranial mass. Right greater than left fairly extensive white matter disease with some interval progression. Chronic infarcts in the right white matter, right basal ganglia and right thalamus. Mild atrophy. Stable ventricle size. Vascular: No hyperdense vessels.  Carotid  vascular calcification Skull: Normal. Negative for fracture or focal lesion. Sinuses/Orbits: No acute finding. Other: None IMPRESSION: 1. No definite CT evidence for acute intracranial abnormality, however patient is noted to have fairly extensive white matter disease which could potentially obscure acute lesions. MRI follow-up may be obtained as indicated. 2. Atrophy and chronic infarcts in the right white matter, right basal ganglia and right thalamus. Electronically Signed   By: Donavan Foil M.D.   On: 02/13/2021 23:19   CT Renal Stone Study  Result Date: 02/13/2021 CLINICAL DATA:  Left-sided flank pain EXAM: CT ABDOMEN AND PELVIS WITHOUT CONTRAST TECHNIQUE: Multidetector CT imaging of the abdomen and pelvis was performed following the standard protocol without IV contrast. COMPARISON:  CT 11/30/2020, 09/05/2020 FINDINGS: Lower chest: Lung bases demonstrate pulmonary fibrosis. No acute consolidation or pleural effusion. Hepatobiliary: Status post cholecystectomy. Decreased pneumobilia. Dilated extrahepatic common bile duct measuring up to 15 mm. Pancreas: Unremarkable. No pancreatic  ductal dilatation or surrounding inflammatory changes. Spleen: Normal in size without focal abnormality. Adrenals/Urinary Tract: Adrenal glands are normal. Atrophic right kidney with intrarenal vascular calcifications and nonobstructing stones measuring up to 5 mm. Probable small cysts in the right kidney. Severe left-sided hydronephrosis, worse when compared to most recent prior, now also with increased perinephric fat stranding. Left renal cysts. Ureteral stent with proximal pigtail in the renal pelvis and distal pigtail in the bladder. Marked hydroureter which is also increased. Multiple stone fragments in the urinary bladder. Suprapubic catheter is in place. Stone lower pole left kidney measuring 13 mm. Stomach/Bowel: The stomach is nonenlarged. There is no dilated small bowel. No acute bowel Bellard thickening. Postsurgical changes at the sigmoid colon. Vascular/Lymphatic: Advanced aortic atherosclerosis. No aneurysm. Tapered and narrowed appearance of the distal aorta and bifurcation of the iliac vessels. No suspicious nodes. Reproductive: Prostate is unremarkable. Other: Negative for free air or free fluid. Anterior abdominal Stary laxity with bulging mesenteric fat and bowel. Musculoskeletal: No acute or suspicious osseous abnormality. IMPRESSION: 1. Severe left-sided hydronephrosis and hydroureter, worsened as compared with 11/30/2020, now with moderate left perinephric edema and soft tissue stranding. 2. There are bilateral intrarenal calculi. There are multiple small stones within the urinary bladder. 3. Atrophic right kidney 4. Pulmonary fibrosis at the lung bases. Electronically Signed   By: Donavan Foil M.D.   On: 02/13/2021 19:30   IR NEPHROSTOMY PLACEMENT LEFT  Result Date: 02/15/2021 INDICATION: 80 year old with neurogenic bladder, suprapubic tube and right renal atrophy. Patient has left hydronephrosis despite having a ureter stent. Patient needs a percutaneous nephrostomy tube. EXAM: LEFT  PERCUTANEOUS NEPHROSTOMY TUBE WITH ULTRASOUND AND FLUOROSCOPIC GUIDANCE COMPARISON:  None. MEDICATIONS: Inpatient antibiotics were given prior to the procedure. ANESTHESIA/SEDATION: Fentanyl 100 mcg IV; Versed 3.0 mg IV Moderate Sedation Time:  17 minutes The patient was continuously monitored during the procedure by the interventional radiology nurse under my direct supervision. CONTRAST:  5 mL Omnipaque 300-administered into the collecting system(s) FLUOROSCOPY TIME:  Fluoroscopy Time: 1 minutes, 12 seconds, 5 mGy COMPLICATIONS: None immediate. PROCEDURE: Informed written consent was obtained from the patient after a thorough discussion of the procedural risks, benefits and alternatives. All questions were addressed. A timeout was performed prior to the initiation of the procedure. Patient was placed prone. The left flank was prepped and draped in sterile fashion. Maximal barrier sterile technique was utilized including caps, mask, sterile gowns, sterile gloves, sterile drape, hand hygiene and skin antiseptic. Ultrasound demonstrated left hydronephrosis. Skin was anesthetized using 1% lidocaine. Small incision was made. Using ultrasound guidance,  21 gauge needle was directed into a dilated mid pole calyx. Wire was easily advanced into the collecting system. Transitional dilator set was placed. No significant fluid could be aspirated. Bentson wire was advanced into the renal pelvis and the tract was dilated to accommodate a 10 [place] multipurpose drain. Cloudy urine was aspirated. Contrast injection confirmed placement in the renal pelvis. Catheter was attached to bag and sutured to the skin. Dressing was placed. Urine sample was sent for culture. Fluoroscopic images were taken and saved for this procedure. FINDINGS: Moderate to severe left hydronephrosis. Nephrostomy tube was placed via a mid pole calyx. Catheter was coiled in the renal pelvis. There is also a ureter stent in the renal pelvis. IMPRESSION:  Successful placement of a left percutaneous nephrostomy tube using ultrasound and fluoroscopic guidance. Electronically Signed   By: Markus Daft M.D.   On: 02/15/2021 17:09   IR PICC PLACEMENT RIGHT >5 YRS INC IMG GUIDE  Result Date: 02/15/2021 INDICATION: 80 year old with left hydronephrosis and poor venous access. Patient is scheduled for a percutaneous nephrostomy tube and needs venous access for the procedure. EXAM: RIGHT ARM PICC LINE PLACEMENT WITH ULTRASOUND AND FLUOROSCOPIC GUIDANCE MEDICATIONS: None ANESTHESIA/SEDATION: None FLUOROSCOPY TIME:  Fluoroscopy Time: 48 seconds, 2 mGy COMPLICATIONS: None immediate. PROCEDURE: The patient was advised of the possible risks and complications and agreed to undergo the procedure. The patient was then brought to the angiographic suite for the procedure. The right arm was prepped with chlorhexidine, draped in the usual sterile fashion using maximum barrier technique (cap and mask, sterile gown, sterile gloves, large sterile sheet, hand hygiene and cutaneous antiseptic). Local anesthesia was attained by infiltration with 1% lidocaine. Ultrasound demonstrated patency of the right brachial vein, and this was documented with an image. Under real-time ultrasound guidance, this vein was accessed with a 21 gauge micropuncture needle and image documentation was performed. The needle was exchanged over a guidewire for a peel-away sheath through which a 37 cm 5 [place] dual lumen power injectable PICC was advanced, and positioned with its tip at the lower SVC/right atrial junction. Fluoroscopy during the procedure and fluoro spot radiograph confirms appropriate catheter position. The catheter was flushed, secured to the skin, and covered with a sterile dressing. IMPRESSION: Successful placement of a right arm PICC with sonographic and fluoroscopic guidance. The catheter is ready for use. Electronically Signed   By: Markus Daft M.D.   On: 02/15/2021 16:41    Nephrostomy Tube  placed by IR    Subjective: Pt feels well today, but having more pain at side of new nephrostomy.  No fevers or chills, N/V, CP, SOB, HA's or other acute complaints.  Feels ready to go home.     Discharge Exam: Vitals:   02/16/21 1937 02/17/21 0553  BP: (!) 161/89 (!) 150/83  Pulse: 72 71  Resp: 18 18  Temp: 98.1 F (36.7 C) (!) 97.3 F (36.3 C)  SpO2: 100% 100%   Vitals:   02/16/21 0509 02/16/21 1425 02/16/21 1937 02/17/21 0553  BP: (!) 163/84 (!) 145/82 (!) 161/89 (!) 150/83  Pulse: (!) 58 66 72 71  Resp: 16 17 18 18   Temp:  98.1 F (36.7 C) 98.1 F (36.7 C) (!) 97.3 F (36.3 C)  TempSrc:  Oral Oral Oral  SpO2: 100% 99% 100% 100%  Weight:      Height:        General: Pt is alert, awake, not in acute distress Cardiovascular: RRR, S1/S2 +, no rubs, no gallops Respiratory:  normal respiratory effort, on nasal cannula o2 Abdominal: soft and non-distended Genitourinary: L nephrostomy tube in place with no leakage and no surrounding warmth or erythema around the site, pale yellow urine in bag. Extremities: no edema, no cyanosis    The results of significant diagnostics from this hospitalization (including imaging, microbiology, ancillary and laboratory) are listed below for reference.     Microbiology: Recent Results (from the past 240 hour(s))  Blood culture (routine x 2)     Status: None (Preliminary result)   Collection Time: 02/13/21  6:37 PM   Specimen: BLOOD  Result Value Ref Range Status   Specimen Description BLOOD BLOOD LEFT ARM  Final   Special Requests   Final    Blood Culture results may not be optimal due to an inadequate volume of blood received in culture bottles BOTTLES DRAWN AEROBIC AND ANAEROBIC   Culture   Final    NO GROWTH 3 DAYS Performed at [facility], [address] [place], [place] [address]    Report Status PENDING  Incomplete  Blood culture (routine x 2)     Status: None (Preliminary result)   Collection Time: 02/13/21  6:37 PM    Specimen: BLOOD  Result Value Ref Range Status   Specimen Description BLOOD BLOOD RIGHT ARM  Final   Special Requests   Final    Blood Culture adequate volume BOTTLES DRAWN AEROBIC AND ANAEROBIC   Culture   Final    NO GROWTH 3 DAYS Performed at [facility], [address], [place], [place] [address]    Report Status PENDING  Incomplete  Resp Panel by RT-PCR (Flu A&B, Covid) Nasopharyngeal Swab     Status: None   Collection Time: 02/13/21  8:29 PM   Specimen: Nasopharyngeal Swab; Nasopharyngeal(NP) swabs in vial transport medium  Result Value Ref Range Status   SARS Coronavirus 2 by RT PCR NEGATIVE NEGATIVE Final    Comment: (NOTE) SARS-CoV-2 target nucleic acids are NOT DETECTED.  The SARS-CoV-2 RNA is generally detectable in upper respiratory specimens during the acute phase of infection. The lowest concentration of SARS-CoV-2 viral copies this assay can detect is 138 copies/mL. A negative result does not preclude SARS-Cov-2 infection and should not be used as the sole basis for treatment or other patient management decisions. A negative result may occur with  improper specimen collection/handling, submission of specimen other than nasopharyngeal swab, presence of viral mutation(s) within the areas targeted by this assay, and inadequate number of viral copies(<138 copies/mL). A negative result must be combined with clinical observations, patient history, and epidemiological information. The expected result is Negative.  Fact Sheet for Patients:  EntrepreneurPulse.com.au  Fact Sheet for Healthcare Providers:  IncredibleEmployment.be  This test is no t yet approved or cleared by the [place] FDA and  has been authorized for detection and/or diagnosis of SARS-CoV-2 by FDA under an Emergency Use Authorization (EUA). This EUA will remain  in effect (meaning this test can be used) for the duration of the COVID-19 declaration under Section  564(b)(1) of the Act, 21 U.S.C.section 360bbb-3(b)(1), unless the authorization is terminated  or revoked sooner.       Influenza A by PCR NEGATIVE NEGATIVE Final   Influenza B by PCR NEGATIVE NEGATIVE Final    Comment: (NOTE) The Xpert Xpress SARS-CoV-2/FLU/RSV plus assay is intended as an aid in the diagnosis of influenza from Nasopharyngeal swab specimens and should not be used as a sole basis for treatment. Nasal washings and aspirates are unacceptable for  Xpert Xpress SARS-CoV-2/FLU/RSV testing.  Fact Sheet for Patients: EntrepreneurPulse.com.au  Fact Sheet for Healthcare Providers: IncredibleEmployment.be  This test is not yet approved or cleared by the [place] FDA and has been authorized for detection and/or diagnosis of SARS-CoV-2 by FDA under an Emergency Use Authorization (EUA). This EUA will remain in effect (meaning this test can be used) for the duration of the COVID-19 declaration under Section 564(b)(1) of the Act, 21 U.S.C. section 360bbb-3(b)(1), unless the authorization is terminated or revoked.  Performed at [facility], [address], [place], [place] [address]   Culture, Urine     Status: Abnormal   Collection Time: 02/13/21 10:54 PM   Specimen: Urine, Clean Catch  Result Value Ref Range Status   Specimen Description   Final    URINE, CLEAN CATCH Performed at [facility], [address], [place], [place] [address]    Special Requests   Final    NONE Performed at [facility], [address] [place], [place] [address]    Culture MULTIPLE SPECIES PRESENT, SUGGEST RECOLLECTION (A)  Final   Report Status 02/15/2021 FINAL  Final  Urine Culture     Status: Abnormal (Preliminary result)   Collection Time: 02/15/21  2:51 PM   Specimen: Kidney; Urine  Result Value Ref Range Status   Specimen Description   Final    KIDNEY LEFT NEPHROSTOMY Performed at [facility] [address],  [place], [place] [address]    Special Requests   Final    NONE Performed at [facility], [place] [address] [place], [place] [address]    Culture (A)  Final    >=100,000 COLONIES/mL ENTEROCOCCUS FAECALIS SUSCEPTIBILITIES TO FOLLOW CULTURE REINCUBATED FOR BETTER GROWTH Performed at [facility], [place] [address], [place],  [address]    Report Status PENDING  Incomplete     Labs: BNP (last 3 results) No results for input(s): BNP in the last 8760 hours. Basic Metabolic Panel: Recent Labs  Lab 02/13/21 1837 02/14/21 0426 02/15/21 0413 02/16/21 0611 02/17/21 0445  NA 138 145 153* 143 144  K 3.0* 3.8 3.9 2.9* 3.5  CL 100 110 121* 113* 112*  CO2 27 26 27 24 24   GLUCOSE 127* 100* 89 125* 103*  BUN 43* 44* 51* 36* 30*  CREATININE 3.23* 3.33* 3.16* 2.60* 2.37*  CALCIUM 9.0 9.4 8.9 8.3* 8.6*  MG 2.1  --   --   --  1.9   Liver Function Tests: Recent Labs  Lab 02/13/21 1837  AST 21  ALT 12  ALKPHOS 115  BILITOT 0.6  PROT 8.1  ALBUMIN 4.0   No results for input(s): LIPASE, AMYLASE in the last 168 hours. No results for input(s): AMMONIA in the last 168 hours. CBC: Recent Labs  Lab 02/13/21 1837 02/14/21 0426 02/15/21 0413 02/17/21 0445  WBC 10.8* 14.8* 9.7 7.6  NEUTROABS 8.9*  --   --   --   HGB 13.4 13.7 12.5* 10.9*  HCT 41.6 42.6 40.9 35.1*  MCV 93.5 92.2 96.7 94.6  PLT 235 186 164 143*   Cardiac Enzymes: No results for input(s): CKTOTAL, CKMB, CKMBINDEX, TROPONINI in the last 168 hours. BNP: Invalid input(s): POCBNP CBG: Recent Labs  Lab 02/16/21 0507 02/16/21 1133 02/16/21 1653 02/16/21 2358 02/17/21 0638  GLUCAP 131* 104* 106* 125* 109*   D-Dimer No results for input(s): DDIMER in the last 72 hours. Hgb A1c No results for input(s): HGBA1C in the last 72 hours. Lipid Profile  No results for input(s): CHOL, HDL, LDLCALC, TRIG, CHOLHDL, LDLDIRECT in the last 72 hours. Thyroid function studies No results for input(s): TSH,  T4TOTAL, T3FREE, THYROIDAB in the last 72 hours.  Invalid input(s): FREET3 Anemia work up No results for input(s): VITAMINB12, FOLATE, FERRITIN, TIBC, IRON, RETICCTPCT in the last 72 hours. Urinalysis    Component Value Date/Time   COLORURINE STRAW (A) 02/13/2021 1744   APPEARANCEUR CLEAR 02/13/2021 1744   LABSPEC 1.004 (L) 02/13/2021 1744   PHURINE 8.0 02/13/2021 1744   GLUCOSEU 50 (A) 02/13/2021 1744   HGBUR LARGE (A) 02/13/2021 1744   BILIRUBINUR NEGATIVE 02/13/2021 [place] 02/13/2021 1744   PROTEINUR 100 (A) 02/13/2021 1744   UROBILINOGEN 0.2 07/17/2014 1215   NITRITE NEGATIVE 02/13/2021 1744   LEUKOCYTESUR LARGE (A) 02/13/2021 1744   Sepsis Labs Invalid input(s): PROCALCITONIN,  WBC,  LACTICIDVEN Microbiology Recent Results (from the past 240 hour(s))  Blood culture (routine x 2)     Status: None (Preliminary result)   Collection Time: 02/13/21  6:37 PM   Specimen: BLOOD  Result Value [place] Status   Specimen Description BLOOD BLOOD LEFT ARM  Final   Special Requests   Final    Blood Culture results may not be optimal due to an inadequate volume of blood received in culture bottles BOTTLES DRAWN AEROBIC AND ANAEROBIC   Culture   Final    NO GROWTH 3 DAYS Performed at [facility], [address] [place], [place] [address]    Report Status PENDING  Incomplete  Blood culture (routine x 2)     Status: None (Preliminary result)   Collection Time: 02/13/21  6:37 PM   Specimen: BLOOD  Result Value [place] Status   Specimen Description BLOOD BLOOD RIGHT ARM  Final   Special Requests   Final    Blood Culture adequate volume BOTTLES DRAWN AEROBIC AND ANAEROBIC   Culture   Final    NO GROWTH 3 DAYS Performed at [facility], [address] [place], [place] [address]    Report Status PENDING  Incomplete  Resp Panel by RT-PCR (Flu A&B, Covid) Nasopharyngeal Swab     Status: None   Collection Time: 02/13/21  8:29 PM   Specimen: Nasopharyngeal Swab;  Nasopharyngeal(NP) swabs in vial transport medium  Result Value [place] Status   SARS Coronavirus 2 by RT PCR NEGATIVE NEGATIVE Final    Comment: (NOTE) SARS-CoV-2 target nucleic acids are NOT DETECTED.  The SARS-CoV-2 RNA is generally detectable in upper respiratory specimens during the acute phase of infection. The lowest concentration of SARS-CoV-2 viral copies this assay can detect is 138 copies/mL. A negative result does not preclude SARS-Cov-2 infection and should not be used as the sole basis for treatment or other patient management decisions. A negative result may occur with  improper specimen collection/handling, submission of specimen other than nasopharyngeal swab, presence of viral mutation(s) within the areas targeted by this assay, and inadequate number of viral copies(<138 copies/mL). A negative result must be combined with clinical observations, patient history, and epidemiological information. The expected result is Negative.  Fact Sheet for Patients:  EntrepreneurPulse.com.au  Fact Sheet for Healthcare Providers:  IncredibleEmployment.be  This test is no t yet approved or cleared by the [place] FDA and  has been authorized for detection and/or diagnosis of SARS-CoV-2 by FDA under an Emergency Use Authorization (EUA). This EUA will remain  in effect (meaning this test can be used) for the duration of the COVID-19  declaration under Section 564(b)(1) of the Act, 21 U.S.C.section 360bbb-3(b)(1), unless the authorization is terminated  or revoked sooner.       Influenza A by PCR NEGATIVE NEGATIVE Final   Influenza B by PCR NEGATIVE NEGATIVE Final    Comment: (NOTE) The Xpert Xpress SARS-CoV-2/FLU/RSV plus assay is intended as an aid in the diagnosis of influenza from Nasopharyngeal swab specimens and should not be used as a sole basis for treatment. Nasal washings and aspirates are unacceptable for Xpert Xpress  SARS-CoV-2/FLU/RSV testing.  Fact Sheet for Patients: EntrepreneurPulse.com.au  Fact Sheet for Healthcare Providers: IncredibleEmployment.be  This test is not yet approved or cleared by the [place] FDA and has been authorized for detection and/or diagnosis of SARS-CoV-2 by FDA under an Emergency Use Authorization (EUA). This EUA will remain in effect (meaning this test can be used) for the duration of the COVID-19 declaration under Section 564(b)(1) of the Act, 21 U.S.C. section 360bbb-3(b)(1), unless the authorization is terminated or revoked.  Performed at [facility], [address] [place], [place] [address]   Culture, Urine     Status: Abnormal   Collection Time: 02/13/21 10:54 PM   Specimen: Urine, Clean Catch  Result Value [place] Status   Specimen Description   Final    URINE, CLEAN CATCH Performed at [facility], [address] [place], [place] [address]    Special Requests   Final    NONE Performed at [facility], [address], [place], [place] [address]    Culture MULTIPLE SPECIES PRESENT, SUGGEST RECOLLECTION (A)  Final   Report Status 02/15/2021 FINAL  Final  Urine Culture     Status: Abnormal (Preliminary result)   Collection Time: 02/15/21  2:51 PM   Specimen: Kidney; Urine  Result Value [place] Status   Specimen Description   Final    KIDNEY LEFT NEPHROSTOMY Performed at [facility] [address], [place], [place] [address]    Special Requests   Final    NONE Performed at [facility], [place] [address] [place], [place] [address]    Culture (A)  Final    >=100,000 COLONIES/mL ENTEROCOCCUS FAECALIS SUSCEPTIBILITIES TO FOLLOW CULTURE REINCUBATED FOR BETTER GROWTH Performed at [facility], [place] [address] [place], [place] [address]    Report Status PENDING  Incomplete     Time coordinating discharge: Over 30 minutes  SIGNED:   Ezekiel Slocumb,  DO [facility] 02/17/2021, 10:29 AM   If 7PM-7AM, please contact night-coverage [email or web address]

## 2021-02-17 NOTE — TOC Transition Note (Signed)
Transition of Care [facility]) - CM/SW Discharge Note   Patient Details  Name: Jordan Ward MRN: [id number] Date of Birth: 01/24/1941  Transition of Care [facility]) CM/SW Contact:  Lennart Pall, LCSW Phone Number: 02/17/2021, 10:55 AM   Clinical Narrative:    Alerted by MD that pt medically ready for dc today.  HHPT orders placed.  Spoke with pt and wife and they would like to have [employer] rights manager) again - referral placed with [place].  No further needs.  Wife and daughter to pick up pt this afternoon.  RN aware.   Final next level of care: [place] Barriers to Discharge: Barriers Resolved   Patient Goals and CMS Choice Patient states their goals for this hospitalization and ongoing recovery are:: go home CMS Medicare.gov Compare Post Acute Care list provided to:: Patient Represenative (must comment)    Discharge Placement                       Discharge Plan and Services   Discharge Planning Services: CM Consult            DME Arranged: N/A DME Agency: NA       HH Arranged: PT [place] Agency: [place] ([place]) Date [place]: 02/17/21 Time [place]: 1055 Representative spoke with at [place]: [place] ([place]) Interventions     Readmission Risk Interventions Readmission Risk Prevention Plan 02/15/2021 09/06/2020  Transportation Screening - Complete  PCP or Specialist Appt within 5-7 Days Complete -  Home Care Screening Complete -  Medication Review (RN CM) Complete -  HRI or [place] - Complete  Social Work Consult for [place] Planning/Counseling - Complete  Palliative Care Screening - Not Applicable  Medication Review Press photographer) - Complete  Some recent data might be hidden

## 2021-02-17 NOTE — Progress Notes (Signed)
Pt given and explained discharge instructions. Pt educated again on nephrostomy tube care and suprapubic catheter care, both of which he is familiar with. Necessary supplies to care for both catheters were provided to the patient. Pt educated on calling urology and his PCP to follow up outpatient. PICC line removed by IV team. All questions answered. Pt advised to pick up his prescribed medications at his pharmacy. Pt dressed in personal clothing and taken to the main entrance via wheelchair.

## 2021-02-17 NOTE — Progress Notes (Signed)
OT Cancellation Note  Patient Details Name: Jordan Ward MRN: [id number] DOB: September 06, 1941   Cancelled Treatment:    Reason Eval/Treat Not Completed: Other (comment). Patient discharging today. Has HH PT orders. Per PT note patient' wife assists him with all ADLs. Will defer OT eval for now unless patient does not discharge.  Alba Kriesel L Jaislyn Blinn 02/17/2021, 11:47 AM

## 2021-02-18 ENCOUNTER — Telehealth: Payer: Self-pay | Admitting: Neurology

## 2021-02-18 DIAGNOSIS — I5032 Chronic diastolic (congestive) heart failure: Secondary | ICD-10-CM | POA: Diagnosis not present

## 2021-02-18 DIAGNOSIS — N390 Urinary tract infection, site not specified: Secondary | ICD-10-CM | POA: Diagnosis not present

## 2021-02-18 DIAGNOSIS — Z436 Encounter for attention to other artificial openings of urinary tract: Secondary | ICD-10-CM | POA: Diagnosis not present

## 2021-02-18 DIAGNOSIS — E039 Hypothyroidism, unspecified: Secondary | ICD-10-CM | POA: Diagnosis not present

## 2021-02-18 DIAGNOSIS — G35 Multiple sclerosis: Secondary | ICD-10-CM | POA: Diagnosis not present

## 2021-02-18 DIAGNOSIS — N179 Acute kidney failure, unspecified: Secondary | ICD-10-CM | POA: Diagnosis not present

## 2021-02-18 DIAGNOSIS — J8410 Pulmonary fibrosis, unspecified: Secondary | ICD-10-CM | POA: Diagnosis not present

## 2021-02-18 DIAGNOSIS — E870 Hyperosmolality and hypernatremia: Secondary | ICD-10-CM | POA: Diagnosis not present

## 2021-02-18 DIAGNOSIS — E785 Hyperlipidemia, unspecified: Secondary | ICD-10-CM | POA: Diagnosis not present

## 2021-02-18 DIAGNOSIS — I471 Supraventricular tachycardia: Secondary | ICD-10-CM | POA: Diagnosis not present

## 2021-02-18 DIAGNOSIS — Z435 Encounter for attention to cystostomy: Secondary | ICD-10-CM | POA: Diagnosis not present

## 2021-02-18 DIAGNOSIS — R1310 Dysphagia, unspecified: Secondary | ICD-10-CM | POA: Diagnosis not present

## 2021-02-18 DIAGNOSIS — I130 Hypertensive heart and chronic kidney disease with heart failure and stage 1 through stage 4 chronic kidney disease, or unspecified chronic kidney disease: Secondary | ICD-10-CM | POA: Diagnosis not present

## 2021-02-18 DIAGNOSIS — J9611 Chronic respiratory failure with hypoxia: Secondary | ICD-10-CM | POA: Diagnosis not present

## 2021-02-18 DIAGNOSIS — E1122 Type 2 diabetes mellitus with diabetic chronic kidney disease: Secondary | ICD-10-CM | POA: Diagnosis not present

## 2021-02-18 DIAGNOSIS — Z466 Encounter for fitting and adjustment of urinary device: Secondary | ICD-10-CM | POA: Diagnosis not present

## 2021-02-18 DIAGNOSIS — N136 Pyonephrosis: Secondary | ICD-10-CM | POA: Diagnosis not present

## 2021-02-18 DIAGNOSIS — G4733 Obstructive sleep apnea (adult) (pediatric): Secondary | ICD-10-CM | POA: Diagnosis not present

## 2021-02-18 DIAGNOSIS — E1151 Type 2 diabetes mellitus with diabetic peripheral angiopathy without gangrene: Secondary | ICD-10-CM | POA: Diagnosis not present

## 2021-02-18 DIAGNOSIS — N1832 Chronic kidney disease, stage 3b: Secondary | ICD-10-CM | POA: Diagnosis not present

## 2021-02-18 DIAGNOSIS — E876 Hypokalemia: Secondary | ICD-10-CM | POA: Diagnosis not present

## 2021-02-18 DIAGNOSIS — I420 Dilated cardiomyopathy: Secondary | ICD-10-CM | POA: Diagnosis not present

## 2021-02-18 DIAGNOSIS — N319 Neuromuscular dysfunction of bladder, unspecified: Secondary | ICD-10-CM | POA: Diagnosis not present

## 2021-02-18 DIAGNOSIS — I2510 Atherosclerotic heart disease of native coronary artery without angina pectoris: Secondary | ICD-10-CM | POA: Diagnosis not present

## 2021-02-18 DIAGNOSIS — J449 Chronic obstructive pulmonary disease, unspecified: Secondary | ICD-10-CM | POA: Diagnosis not present

## 2021-02-18 LAB — CULTURE, BLOOD (ROUTINE X 2)
Culture: NO GROWTH
Culture: NO GROWTH
Special Requests: ADEQUATE

## 2021-02-18 NOTE — Telephone Encounter (Signed)
Pt's wife is asking for a call to discuss this 2nd occurrence of pt having the shakes.  Wife states the shakes are in his arms hands he is stuttering and having headaches.  Wife wants to know if this has to do with MS, please call

## 2021-02-19 NOTE — Telephone Encounter (Signed)
Chart reviewed, history of MS, last seen in February 2022, has neurogenic bladder with catheter in place.  Health has been on the decline for the last several months, on oxygen since COVID.  Recent hospitalization for left flank pain and chills, creatinine had worsened to 3.23, UA consistent with infection.  CT renal stone showed severe left-sided hydronephrosis, percutaneous nephrostomy tube placed.  Was hypernatremic, sodium 153, on oxygen 1-1/2 L  At night.  CT head showed no acute abnormality, but was noted to have fairly extensive white matter disease, that could potentially obscure acute lesions.  MRI could be obtained.  Atrophy and chronic infarcts in the right white matter, right basal ganglia and thalamus.   Go ahead and follow-up with PCP for thyroid issues. MRI of the brain can be considered. Nothing acute on CT head, not on MS medications. May be good to see Dr. Jannifer Franklin, last saw him back in Nov 2020, if concern for change in neuro condition. If Jannifer Franklin has nothing, see if you can find something on my schedule on a week he is here.

## 2021-02-19 NOTE — Telephone Encounter (Signed)
Called wife and patient.  He had been in the hospital for kidney problems recently.  She has multiple concerns.  Has sharp shooting headache pains lasting a minute to 10 minutes.  These are random.  Also complains of stuttering and tremors in the upper body.  Also some personality changes.  The symptoms have been going on she said 2 months and that a personality change and she said I guess a year.  He had tremors/shakes 10 to 15 years ago.  When in the hospital they thought they were more anxiety.  He is better since hospitalization for kidney problem.  Does have an appointment with primary care to follow-up on thyroid issues.   She is questioning an appointment for him.  She states she did not get CT head results.  Are you okay to see this?  Or does he need to see the MD.  She mentioned a family member has an appointment June 23 and asked about coming in on that day you do have an opening at 915.

## 2021-02-20 NOTE — Telephone Encounter (Addendum)
I called Jordan Ward.  I relayed that [name] reviewed his chart  Along with emergency room visit.  She did state that that nothing acute on the CT of his head possible MRI could be done later if needed.  Seen primary care doctor for thyroid would be ok.  Patient will be getting home health physical therapy.  I told her I would be on the look out for a cancellation on steroids schedule for when Dr. Jannifer Franklin is here.  She was appreciative of the call. Pt was feeling better.

## 2021-02-21 DIAGNOSIS — N390 Urinary tract infection, site not specified: Secondary | ICD-10-CM | POA: Diagnosis not present

## 2021-02-21 DIAGNOSIS — Z435 Encounter for attention to cystostomy: Secondary | ICD-10-CM | POA: Diagnosis not present

## 2021-02-21 DIAGNOSIS — N136 Pyonephrosis: Secondary | ICD-10-CM | POA: Diagnosis not present

## 2021-02-21 DIAGNOSIS — N319 Neuromuscular dysfunction of bladder, unspecified: Secondary | ICD-10-CM | POA: Diagnosis not present

## 2021-02-21 DIAGNOSIS — Z436 Encounter for attention to other artificial openings of urinary tract: Secondary | ICD-10-CM | POA: Diagnosis not present

## 2021-02-21 DIAGNOSIS — Z466 Encounter for fitting and adjustment of urinary device: Secondary | ICD-10-CM | POA: Diagnosis not present

## 2021-02-22 DIAGNOSIS — Z466 Encounter for fitting and adjustment of urinary device: Secondary | ICD-10-CM | POA: Diagnosis not present

## 2021-02-22 DIAGNOSIS — N390 Urinary tract infection, site not specified: Secondary | ICD-10-CM | POA: Diagnosis not present

## 2021-02-22 DIAGNOSIS — N136 Pyonephrosis: Secondary | ICD-10-CM | POA: Diagnosis not present

## 2021-02-22 DIAGNOSIS — Z436 Encounter for attention to other artificial openings of urinary tract: Secondary | ICD-10-CM | POA: Diagnosis not present

## 2021-02-22 DIAGNOSIS — N319 Neuromuscular dysfunction of bladder, unspecified: Secondary | ICD-10-CM | POA: Diagnosis not present

## 2021-02-22 DIAGNOSIS — Z435 Encounter for attention to cystostomy: Secondary | ICD-10-CM | POA: Diagnosis not present

## 2021-02-27 DIAGNOSIS — Z466 Encounter for fitting and adjustment of urinary device: Secondary | ICD-10-CM | POA: Diagnosis not present

## 2021-02-27 DIAGNOSIS — N136 Pyonephrosis: Secondary | ICD-10-CM | POA: Diagnosis not present

## 2021-02-27 DIAGNOSIS — Z436 Encounter for attention to other artificial openings of urinary tract: Secondary | ICD-10-CM | POA: Diagnosis not present

## 2021-02-27 DIAGNOSIS — N319 Neuromuscular dysfunction of bladder, unspecified: Secondary | ICD-10-CM | POA: Diagnosis not present

## 2021-02-27 DIAGNOSIS — Z435 Encounter for attention to cystostomy: Secondary | ICD-10-CM | POA: Diagnosis not present

## 2021-02-27 DIAGNOSIS — N390 Urinary tract infection, site not specified: Secondary | ICD-10-CM | POA: Diagnosis not present

## 2021-02-27 LAB — SUSCEPTIBILITY RESULT

## 2021-02-27 LAB — SUSCEPTIBILITY, AER + ANAEROB

## 2021-02-28 DIAGNOSIS — N319 Neuromuscular dysfunction of bladder, unspecified: Secondary | ICD-10-CM | POA: Diagnosis not present

## 2021-02-28 DIAGNOSIS — Z436 Encounter for attention to other artificial openings of urinary tract: Secondary | ICD-10-CM | POA: Diagnosis not present

## 2021-02-28 DIAGNOSIS — Z435 Encounter for attention to cystostomy: Secondary | ICD-10-CM | POA: Diagnosis not present

## 2021-02-28 DIAGNOSIS — Z466 Encounter for fitting and adjustment of urinary device: Secondary | ICD-10-CM | POA: Diagnosis not present

## 2021-02-28 DIAGNOSIS — N136 Pyonephrosis: Secondary | ICD-10-CM | POA: Diagnosis not present

## 2021-02-28 DIAGNOSIS — N390 Urinary tract infection, site not specified: Secondary | ICD-10-CM | POA: Diagnosis not present

## 2021-03-01 DIAGNOSIS — Z466 Encounter for fitting and adjustment of urinary device: Secondary | ICD-10-CM | POA: Diagnosis not present

## 2021-03-01 DIAGNOSIS — Z436 Encounter for attention to other artificial openings of urinary tract: Secondary | ICD-10-CM | POA: Diagnosis not present

## 2021-03-01 DIAGNOSIS — N390 Urinary tract infection, site not specified: Secondary | ICD-10-CM | POA: Diagnosis not present

## 2021-03-01 DIAGNOSIS — Z435 Encounter for attention to cystostomy: Secondary | ICD-10-CM | POA: Diagnosis not present

## 2021-03-01 DIAGNOSIS — N319 Neuromuscular dysfunction of bladder, unspecified: Secondary | ICD-10-CM | POA: Diagnosis not present

## 2021-03-01 DIAGNOSIS — N136 Pyonephrosis: Secondary | ICD-10-CM | POA: Diagnosis not present

## 2021-03-01 LAB — URINE CULTURE: Culture: >=100000 — AB

## 2021-03-03 NOTE — Progress Notes (Signed)
Pt location: Home Provider (me) location: Office

## 2021-03-04 DIAGNOSIS — Z466 Encounter for fitting and adjustment of urinary device: Secondary | ICD-10-CM | POA: Diagnosis not present

## 2021-03-04 DIAGNOSIS — N319 Neuromuscular dysfunction of bladder, unspecified: Secondary | ICD-10-CM | POA: Diagnosis not present

## 2021-03-04 DIAGNOSIS — N390 Urinary tract infection, site not specified: Secondary | ICD-10-CM | POA: Diagnosis not present

## 2021-03-04 DIAGNOSIS — Z436 Encounter for attention to other artificial openings of urinary tract: Secondary | ICD-10-CM | POA: Diagnosis not present

## 2021-03-04 DIAGNOSIS — Z435 Encounter for attention to cystostomy: Secondary | ICD-10-CM | POA: Diagnosis not present

## 2021-03-04 DIAGNOSIS — N136 Pyonephrosis: Secondary | ICD-10-CM | POA: Diagnosis not present

## 2021-03-04 NOTE — Progress Notes (Signed)
History of Present Illness: Jordan Ward has a history of recurrent renal calculi.  He has an atrophic right kidney and has had multiple stones on the left side treated.  He has neurogenic bladder and has a suprapubic tube.  He has an indwelling ureteral stent.  Despite this, he did develop persistent hydronephrosis.  At my insistence, he eventually underwent placement of a left-sided percutaneous nephrostomy tube.  This was done on Feb 16, 2019.  Urine culture from his renal pelvis revealed Aerococcus and Enterococcus.  Enterococcus sensitive to penicillin and linezolid.  He is complaining about some pain around his nephrostomy tube.  Most of his urine is coming out the nephrostomy tube, perhaps 200 mL a night comes out his suprapubic tube.  Past Medical History:  Diagnosis Date  . Anemia   . Arthritis   . Back pain, chronic   . Bilateral carotid bruits   . C. difficile colitis 09/2011  . CAD (coronary artery disease)   . Carotid artery stenosis   . Cerebrovascular disease   . Cerebrovascular disease 08/17/2018  . Colon polyps   . Complication of cystostomy catheter, initial encounter ([place]) 04/20/2020  . Dyslipidemia   . Dysphagia 10/07/2011  . Encephalopathy   . Gait disorder   . HA (headache)   . High grade dysplasia in colonic adenoma 09/2005  . History of kidney stones   . HTN (hypertension), malignant 10/06/2011  . Hypernatremia   . Hypokalemia   . Hypothyroidism 10/08/2011  . Insomnia   . Junctional rhythm   . Kidney stones   . MS (multiple sclerosis) ([place])   . Neuromuscular disorder (HCC)    MS  . OSA (obstructive sleep apnea)   . Paroxysmal atrial tachycardia ([place])   . Peripheral vascular disease ([place])   . Pneumonia 4 yrs ago  . Pulmonary fibrosis ([place]) 10/06/2011  . Pulmonary nodule 10/08/2011  . PVD (peripheral vascular disease) ([place])   . Sacral ulcer ([place])   . Sleep apnea    cannot tolerate  . Stroke [facility])    left sided weakness  . Suprapubic catheter ([place])   . TIA (transient  ischemic attack)   . Tremors of nervous system 10/08/2011  . Urinary tract infection   . Ventral hernia without obstruction or gangrene     Large 8X9cm ventral hernia with loss of domain. CT reads report as diastasis recti with herniation or diastasis recti.  Dr. Constance Haw, Surgery, reviewed CT with radiology and there is herniation with only hernia sac or peritoneum over the bowel and large separation of the rectus muslce (i.e. diastasis recti aka loss of domain).  No surgical intervention recommended given size, age, and health.     Past Surgical History:  Procedure Laterality Date  . APPENDECTOMY  09/2005   at time of left hemicolectomy  . BACK SURGERY  1976/1979   lower  . BILIARY DILATION N/A 03/03/2020   Procedure: BILIARY DILATION;  Surgeon: Rogene Houston, MD;  [facility];  Service: Endoscopy;  Laterality: N/A;  . CATARACT EXTRACTION W/PHACO Right 03/08/2018   Procedure: CATARACT EXTRACTION PHACO AND INTRAOCULAR LENS PLACEMENT RIGHT EYE;  Surgeon: Tonny Branch, MD;  Location: [facility];  Service: Ophthalmology;  Laterality: Right;  CDE: 8.86  . CATARACT EXTRACTION W/PHACO Left 04/05/2018   Procedure: CATARACT EXTRACTION PHACO AND INTRAOCULAR LENS PLACEMENT (IOC);  Surgeon: Tonny Branch, MD;  Location: [facility];  Service: Ophthalmology;  Laterality: Left;  CDE: 7.36  . CENTRAL LINE INSERTION-TUNNELED Right 09/11/2020   Procedure:  PLACEMENT OF TUNNELED CENTRAL LINE INTO JUGULAR VEIN;  Surgeon: Virl Cagey, MD;  Location: [facility];  Service: General;  Laterality: Right;  . CHOLECYSTECTOMY     Dr. Tamala Julian  . COLON SURGERY  09/2005   [place]: four tubular adenomas, large adenomatous polyp with HIGH GRADE dysplasia  . COLONOSCOPY  11/2004   Dr. Sharol Roussel sessile polyp splenic flexure, 32mm sessile polyp desc colon, tubulovillous adenoma (bx not removed)  . COLONOSCOPY  01/2005   poor prep, polyp could not be found  . COLONOSCOPY  05/2005   with EMR, polypectomy Dr. Olegario Messier,  bx showed high grade dysplasia, partially resected  . COLONOSCOPY  09/2005   Dr. Arsenio Loader, [place] ink tattooing, four villous colon polyp (3 had been missed on previous colonoscopies due to limitations of procedures  . COLONOSCOPY  09/2006   normal TI, no polyps  . COLONOSCOPY  10/2007   Dr. Imogene Burn distal mammillations, benign bx, normal TI, random bx neg for microscopic colitis  . CYSTOSCOPY W/ URETERAL STENT PLACEMENT Left 09/05/2020   Procedure: CYSTOSCOPY WITH RETROGRADE PYELOGRAM/URETERAL STENT PLACEMENT;  Surgeon: Ardis Hughs, MD;  Location: [facility];  Service: Urology;  Laterality: Left;  . CYSTOSCOPY WITH LITHOLAPAXY N/A 07/27/2018   Procedure: CYSTOSCOPY WITH LITHOLAPAXY VIA  SUPRAPUBIC TUBE;  Surgeon: Franchot Gallo, MD;  Location: [facility];  Service: Urology;  Laterality: N/A;  . CYSTOSCOPY WITH RETROGRADE PYELOGRAM, URETEROSCOPY AND STENT PLACEMENT Left 06/09/2017   Procedure: CYSTOSCOPY WITH LEFT RETROGRADE PYELOGRAM, LEFT URETEROSCOPY, LEFT URETEROSCOPIC STONE EXTRACTION, LEFT URETERAL STENT PLACEMENT;  Surgeon: Franchot Gallo, MD;  Location: [facility];  Service: Urology;  Laterality: Left;  . CYSTOSCOPY/URETEROSCOPY/HOLMIUM LASER/STENT PLACEMENT Left 05/22/2020   Procedure: CYSTOSCOPY/URETEROSCOPY/STENT PLACEMENT;  Surgeon: Franchot Gallo, MD;  Location: [facility];  Service: Urology;  Laterality: Left;  . ERCP N/A 03/03/2020   Procedure: ENDOSCOPIC RETROGRADE CHOLANGIOPANCREATOGRAPHY (ERCP);  Surgeon: Rogene Houston, MD;  [facility];  Service: Endoscopy;  Laterality: N/A;  . ERCP N/A 04/20/2020   Procedure: ENDOSCOPIC RETROGRADE CHOLANGIOPANCREATOGRAPHY (ERCP);  Surgeon: Rogene Houston, MD;  [facility];  Service: Endoscopy;  Laterality: N/A;  to be done at 7:30am in OR  . GASTROINTESTINAL STENT REMOVAL N/A 04/20/2020   Procedure: STENT REMOVAL;  Surgeon: Rogene Houston, MD;  [facility];  Service: Endoscopy;  Laterality: N/A;  .  INGUINAL HERNIA REPAIR  1971   bilateral  . INSERTION OF SUPRAPUBIC CATHETER  06/09/2017   Procedure: EXCHANGE OF SUPRAPUBIC CATHETER;  Surgeon: Franchot Gallo, MD;  Location: [facility];  Service: Urology;;  . INSERTION OF SUPRAPUBIC CATHETER  05/22/2020   Procedure: SUPRAPUBIC CATHETER EXCHANGE;  Surgeon: Franchot Gallo, MD;  Location: [facility];  Service: Urology;;  . IR NEPHROSTOMY PLACEMENT LEFT  05/26/2017  . IR NEPHROSTOMY PLACEMENT LEFT  05/02/2020  . IR NEPHROSTOMY PLACEMENT LEFT  02/15/2021  . KIDNEY STONE SURGERY  09/13/2015  . LITHOTRIPSY N/A 03/03/2020   Procedure: MECHANICAL LITHOTRIPSY WITH REMOVAL OF MULTIPLE STONE FRAGMENTS;  Surgeon: Rogene Houston, MD;  [facility];  Service: Endoscopy;  Laterality: N/A;  . NEPHROLITHOTOMY Left 09/13/2015   Procedure: LEFT PERCUTANEOUS NEPHROLITHOTOMY ;  Surgeon: Franchot Gallo, MD;  Location: [facility];  Service: Urology;  Laterality: Left;  . NEPHROSTOMY TUBE REMOVAL  05/22/2020   Procedure: NEPHROSTOMY TUBE REMOVAL;  Surgeon: Franchot Gallo, MD;  Location: [facility];  Service: Urology;;  . PORT-A-CATH REMOVAL Right 09/20/2020   Procedure: MINOR REMOVAL CENTRAL LINE;  Surgeon: Curlene Labrum  C, MD;  Location: [facility];  Service: General;  Laterality: Right;  Pt to arrive at 7:30am for procedure  . REMOVAL OF STONES N/A 04/20/2020   Procedure: REMOVAL OF STONES;  Surgeon: Rogene Houston, MD;  [facility];  Service: Endoscopy;  Laterality: N/A;  . SPHINCTEROTOMY  03/03/2020   Procedure: BILLARY SPHINCTEROTOMY;  Surgeon: Rogene Houston, MD;  [facility];  Service: Endoscopy;;  . SUPRAPUBIC CATHETER INSERTION      Home Medications:  Allergies as of 03/05/2021      Reactions   Tetracyclines & Related Anaphylaxis, Rash   Ciprofloxacin    Trouble swallowing unknown reaction according to wife       Medication List       Accurate as of March 04, 2021  8:26 PM. If you have any questions, ask your nurse or  doctor.        amLODipine 5 MG tablet Commonly known as: NORVASC Take 1 tablet (5 mg total) by mouth daily.   diazepam 5 MG tablet Commonly known as: VALIUM Take 1 tablet (5 mg total) by mouth every 12 (twelve) hours as needed for muscle spasms.   HYDROcodone-acetaminophen 7.5-325 MG tablet Commonly known as: NORCO Take 1 tablet by mouth 2 (two) times daily. Max APAP 3 GM IN 24 HOURS FROM ALL SOURCES What changed:   when to take this  additional instructions   levothyroxine 88 MCG tablet Commonly known as: SYNTHROID Take 88 mcg by mouth daily.   OXYGEN Inhale 1.5 L into the lungs every evening.       Allergies:  Allergies  Allergen Reactions  . Tetracyclines & Related Anaphylaxis and Rash  . Ciprofloxacin     Trouble swallowing unknown reaction according to wife     Family History  Problem Relation Age of Onset  . Cirrhosis Brother        etoh  . Stroke Mother 27  . Coronary artery disease Father 21  . Heart attack Brother   . Cancer Sister   . Multiple sclerosis Other   . Colon cancer Neg Hx     Social History:  reports that he quit smoking about 33 years ago. His smoking use included cigarettes. He has a 25.00 pack-year smoking history. He has never used smokeless tobacco. He reports that he does not drink alcohol and does not use drugs.  ROS: A complete review of systems was performed.  All systems are negative except for pertinent findings as noted.  Physical Exam:  Vital signs in last 24 hours: There were no vitals taken for this visit. Constitutional:  Alert and oriented, No acute distress Cardiovascular: Regular rate  Respiratory: Normal respiratory effort GI: Abdomen is soft, nontender, nondistended, no abdominal masses. No CVAT.  Nephrostomy tube is well-positioned.  Tube site is clean.  There is no tenderness around tube site. Lymphatic: No lymphadenopathy Neurologic: Grossly intact, no focal deficits Psychiatric: Normal mood and affect  I  have reviewed prior pt notes  I have reviewed notes from referring/previous physicians  I have independently reviewed prior imaging  I have reviewed prior urine culture    Cystoscopy Procedure Note:  Indication: Stent removal  After informed consent and discussion of the procedure and its risks, MONIQUE GIFT was positioned and prepped in the standard fashion.  Cystoscopy was performed with a flexible cystoscope.  This was performed through his suprapubic site. Findings:  Ureteral orifices: Not well seen Bladder: No urothelial lesions noted.  Stent was  present on the left side of the bladder.  It was encrusted. The left ureteral stent was pulled out of the suprapubic tube site.  Approximately 8 cm of tube could be removed outside of the suprapubic tube site.  There were no doubt encrustations on the proximal tube curl preventing removal of the stent.  I did cut the stent at the suprapubic tube site.  It retracted into the bladder.  The patient tolerated the procedure well.     Impression/Assessment:  1.  Recurrent urolithiasis.  He has had multiple stone procedures, on the left side.  There are stones in the lower pole of the kidney on most recent CT scan  2.  Hydronephrosis, now with nephrostomy tube placed  3.  Retained stent.  I was unable to remove this through the bladder.  Hopefully, this can be done through percutaneous access  Plan:  1.  I discussed leaving the stent in until nephrostomy tube change in about a month.  Hopefully, this can be removed through percutaneous access already established  2.  I will have him come back in about a month for his next suprapubic tube change  3.  He was sent out on 3 days of amoxicillin and received gentamicin in the office.

## 2021-03-05 ENCOUNTER — Other Ambulatory Visit: Payer: Self-pay

## 2021-03-05 ENCOUNTER — Encounter: Payer: Self-pay | Admitting: Urology

## 2021-03-05 ENCOUNTER — Ambulatory Visit (INDEPENDENT_AMBULATORY_CARE_PROVIDER_SITE_OTHER): Payer: Medicare Other | Admitting: Urology

## 2021-03-05 ENCOUNTER — Ambulatory Visit: Payer: Medicare Other | Admitting: Urology

## 2021-03-05 VITALS — BP 130/77 | HR 80 | Ht 65.0 in | Wt 114.0 lb

## 2021-03-05 DIAGNOSIS — N312 Flaccid neuropathic bladder, not elsewhere classified: Secondary | ICD-10-CM | POA: Diagnosis not present

## 2021-03-05 DIAGNOSIS — N1330 Unspecified hydronephrosis: Secondary | ICD-10-CM

## 2021-03-05 DIAGNOSIS — N390 Urinary tract infection, site not specified: Secondary | ICD-10-CM | POA: Diagnosis not present

## 2021-03-05 DIAGNOSIS — N201 Calculus of ureter: Secondary | ICD-10-CM | POA: Diagnosis not present

## 2021-03-05 MED ORDER — AMOXICILLIN 875 MG PO TABS: 875.0000 mg | ORAL_TABLET | Freq: Two times a day (BID) | ORAL | 0 refills | Status: DC

## 2021-03-05 MED ORDER — GENTAMICIN SULFATE 40 MG/ML IJ SOLN
80.0000 mg | Freq: Once | INTRAMUSCULAR | Status: AC
  Administered 2021-03-05: 160 mg via INTRAMUSCULAR

## 2021-03-05 NOTE — Progress Notes (Signed)
Urological Symptom Review  Patient is experiencing the following symptoms: None   Review of Systems  Gastrointestinal (upper)  : Negative for upper GI symptoms  Gastrointestinal (lower) : Negative for lower GI symptoms  Constitutional : Negative for symptoms  Skin: Negative for skin symptoms  Eyes: Negative for eye symptoms  Ear/Nose/Throat : Negative for Ear/Nose/Throat symptoms  Hematologic/Lymphatic: Easy bruising  Cardiovascular : Negative for cardiovascular symptoms  Respiratory : Negative for respiratory symptoms  Endocrine: Negative for endocrine symptoms  Musculoskeletal: Back pain  Neurological: Negative for neurological symptoms  Psychologic: Negative for psychiatric symptoms

## 2021-03-06 ENCOUNTER — Encounter: Payer: Self-pay | Admitting: *Deleted

## 2021-03-07 ENCOUNTER — Encounter: Payer: Self-pay | Admitting: Neurology

## 2021-03-07 ENCOUNTER — Ambulatory Visit (INDEPENDENT_AMBULATORY_CARE_PROVIDER_SITE_OTHER): Payer: Medicare Other | Admitting: Neurology

## 2021-03-07 VITALS — BP 140/86 | HR 74 | Ht 65.0 in | Wt 114.6 lb

## 2021-03-07 DIAGNOSIS — G35 Multiple sclerosis: Secondary | ICD-10-CM

## 2021-03-07 DIAGNOSIS — R6889 Other general symptoms and signs: Secondary | ICD-10-CM | POA: Diagnosis not present

## 2021-03-07 DIAGNOSIS — R269 Unspecified abnormalities of gait and mobility: Secondary | ICD-10-CM | POA: Diagnosis not present

## 2021-03-07 NOTE — Progress Notes (Signed)
Reason for visit: Tremors  Referring physician: Dr. Cleon Gustin is a 80 y.o. male  History of present illness:  Jordan Ward is a 80 year old right-handed white male with a history of multiple sclerosis.  He currently is not on any disease modifying agents.  The patient has a neurogenic bladder, he has a suprapubic catheter in place, he has had problems with hydronephrosis and has had stent placements previously.  He comes in for an evaluation of tremor.  He has had episodes of severe tremor that have occurred on 2 occasions.  The patient was admitted to the hospital on 05 September 2020 with a fever and left flank pain and tachycardia.  He had a white count of 18.4..  He had bacterial sepsis with Enterococcus and Pseudomonas.  He had at least a 2-day event of severe shaking chills during this event.  He was admitted to the hospital again on 13 Feb 2021 with a similar issue.  He was running a low-grade temperature, but the blood cultures were negative at that time.  The patient was treated with antibiotics.  He had 3 days of shaking chills once again.  The patient had difficulty with speaking and walking during these hard shaking chills.  After the infection was treated on both occasions, the tremors disappeared.  The patient has had ongoing left flank pain.  He is sent to this office for further evaluation.  He had COVID and October 2021, he has had significant fatigue issues and decline in his ability to ambulate since that time.  He has been on oxygen continuously until just recently when he was converted to oxygen just in the evening.  He is now walking with a walker since the COVID infection.  He never got vaccinated for COVID.  He comes here for further evaluation.  He denies any new numbness or weakness of the face, arms, legs.  He denies any vision changes or difficulty with swallowing.  He is getting physical therapy in the home environment.  Past Medical History:  Diagnosis Date    Anemia    Arthritis    Back pain, chronic    Bilateral carotid bruits    C. difficile colitis 09/2011   CAD (coronary artery disease)    Carotid artery stenosis    Cerebrovascular disease    Cerebrovascular disease 08/17/2018   Colon polyps    Complication of cystostomy catheter, initial encounter ([place]) 04/20/2020   Dyslipidemia    Dysphagia 10/07/2011   Encephalopathy    Gait disorder    HA (headache)    High grade dysplasia in colonic adenoma 09/2005   History of kidney stones    HTN (hypertension), malignant 10/06/2011   Hypernatremia    Hypokalemia    Hypothyroidism 10/08/2011   Insomnia    Junctional rhythm    Kidney stones    MS (multiple sclerosis) (HCC)    Neuromuscular disorder (HCC)    MS   OSA (obstructive sleep apnea)    Paroxysmal atrial tachycardia ([place])    Peripheral vascular disease ([place])    Pneumonia 4 yrs ago   Pulmonary fibrosis ([place]) 10/06/2011   Pulmonary nodule 10/08/2011   PVD (peripheral vascular disease) ([place])    Sacral ulcer ([place])    Sleep apnea    cannot tolerate   Stroke ([place])    left sided weakness   Suprapubic catheter ([place])    TIA (transient ischemic attack)    Tremors of nervous system 10/08/2011   Urinary  tract infection    Ventral hernia without obstruction or gangrene     Large 8X9cm ventral hernia with loss of domain. CT reads report as diastasis recti with herniation or diastasis recti.  Dr. Constance Haw, Surgery, reviewed CT with radiology and there is herniation with only hernia sac or peritoneum over the bowel and large separation of the rectus muslce (i.e. diastasis recti aka loss of domain).  No surgical intervention recommended given size, age, and health.     Past Surgical History:  Procedure Laterality Date   APPENDECTOMY  09/2005   at time of left hemicolectomy   BACK SURGERY  1976/1979   lower   BILIARY DILATION N/A 03/03/2020   Procedure: BILIARY DILATION;  Surgeon: Rogene Houston, MD;  [facility];  Service: Endoscopy;   Laterality: N/A;   CATARACT EXTRACTION W/PHACO Right 03/08/2018   Procedure: CATARACT EXTRACTION PHACO AND INTRAOCULAR LENS PLACEMENT RIGHT EYE;  Surgeon: Tonny Branch, MD;  Location: [facility];  Service: Ophthalmology;  Laterality: Right;  CDE: 8.86   CATARACT EXTRACTION W/PHACO Left 04/05/2018   Procedure: CATARACT EXTRACTION PHACO AND INTRAOCULAR LENS PLACEMENT (IOC);  Surgeon: Tonny Branch, MD;  Location: [facility];  Service: Ophthalmology;  Laterality: Left;  CDE: 7.36   CENTRAL LINE INSERTION-TUNNELED Right 09/11/2020   Procedure: PLACEMENT OF TUNNELED CENTRAL LINE INTO JUGULAR VEIN;  Surgeon: Virl Cagey, MD;  Location: [facility];  Service: General;  Laterality: Right;   CHOLECYSTECTOMY     Dr. Tamala Julian   COLON SURGERY  09/2005   [place]: four tubular adenomas, large adenomatous polyp with HIGH GRADE dysplasia   COLONOSCOPY  11/2004   Dr. Sharol Roussel sessile polyp splenic flexure, 88mm sessile polyp desc colon, tubulovillous adenoma (bx not removed)   COLONOSCOPY  01/2005   poor prep, polyp could not be found   COLONOSCOPY  05/2005   with EMR, polypectomy Dr. Olegario Messier, bx showed high grade dysplasia, partially resected   COLONOSCOPY  09/2005   Dr. Arsenio Loader, [place] ink tattooing, four villous colon polyp (3 had been missed on previous colonoscopies due to limitations of procedures   COLONOSCOPY  09/2006   normal TI, no polyps   COLONOSCOPY  10/2007   Dr. Imogene Burn distal mammillations, benign bx, normal TI, random bx neg for microscopic colitis   CYSTOSCOPY W/ URETERAL STENT PLACEMENT Left 09/05/2020   Procedure: CYSTOSCOPY WITH RETROGRADE PYELOGRAM/URETERAL STENT PLACEMENT;  Surgeon: Ardis Hughs, MD;  [facility];  Service: Urology;  Laterality: Left;   CYSTOSCOPY WITH LITHOLAPAXY N/A 07/27/2018   Procedure: CYSTOSCOPY WITH LITHOLAPAXY VIA  SUPRAPUBIC TUBE;  Surgeon: Franchot Gallo, MD;  Location: [facility];  Service: Urology;  Laterality: N/A;   CYSTOSCOPY WITH RETROGRADE  PYELOGRAM, URETEROSCOPY AND STENT PLACEMENT Left 06/09/2017   Procedure: CYSTOSCOPY WITH LEFT RETROGRADE PYELOGRAM, LEFT URETEROSCOPY, LEFT URETEROSCOPIC STONE EXTRACTION, LEFT URETERAL STENT PLACEMENT;  Surgeon: Franchot Gallo, MD;  [facility];  Service: Urology;  Laterality: Left;   CYSTOSCOPY/URETEROSCOPY/HOLMIUM LASER/STENT PLACEMENT Left 05/22/2020   Procedure: CYSTOSCOPY/URETEROSCOPY/STENT PLACEMENT;  Surgeon: Franchot Gallo, MD;  [facility];  Service: Urology;  Laterality: Left;   ERCP N/A 03/03/2020   Procedure: ENDOSCOPIC RETROGRADE CHOLANGIOPANCREATOGRAPHY (ERCP);  Surgeon: Rogene Houston, MD;  [facility];  Service: Endoscopy;  Laterality: N/A;   ERCP N/A 04/20/2020   Procedure: ENDOSCOPIC RETROGRADE CHOLANGIOPANCREATOGRAPHY (ERCP);  Surgeon: Rogene Houston, MD;  [facility];  Service: Endoscopy;  Laterality: N/A;  to be done at 7:30am in OR   GASTROINTESTINAL  STENT REMOVAL N/A 04/20/2020   Procedure: STENT REMOVAL;  Surgeon: Rogene Houston, MD;  [facility];  Service: Endoscopy;  Laterality: N/A;   INGUINAL HERNIA REPAIR  1971   bilateral   INSERTION OF SUPRAPUBIC CATHETER  06/09/2017   Procedure: EXCHANGE OF SUPRAPUBIC CATHETER;  Surgeon: Franchot Gallo, MD;  [facility];  Service: Urology;;   INSERTION OF SUPRAPUBIC CATHETER  05/22/2020   Procedure: SUPRAPUBIC CATHETER EXCHANGE;  Surgeon: Franchot Gallo, MD;  Location: [facility];  Service: Urology;;   IR NEPHROSTOMY PLACEMENT LEFT  05/26/2017   IR NEPHROSTOMY PLACEMENT LEFT  05/02/2020   IR NEPHROSTOMY PLACEMENT LEFT  02/15/2021   KIDNEY STONE SURGERY  09/13/2015   LITHOTRIPSY N/A 03/03/2020   Procedure: MECHANICAL LITHOTRIPSY WITH REMOVAL OF MULTIPLE STONE FRAGMENTS;  Surgeon: Rogene Houston, MD;  [facility];  Service: Endoscopy;  Laterality: N/A;   NEPHROLITHOTOMY Left 09/13/2015   Procedure: LEFT PERCUTANEOUS NEPHROLITHOTOMY ;  Surgeon: Franchot Gallo,  MD;  [facility];  Service: Urology;  Laterality: Left;   NEPHROSTOMY TUBE REMOVAL  05/22/2020   Procedure: NEPHROSTOMY TUBE REMOVAL;  Surgeon: Franchot Gallo, MD;  Location: [facility];  Service: Urology;;   [facility] REMOVAL Right 09/20/2020   Procedure: MINOR REMOVAL CENTRAL LINE;  Surgeon: Virl Cagey, MD;  Location: [facility];  Service: General;  Laterality: Right;  Pt to arrive at 7:30am for procedure   REMOVAL OF STONES N/A 04/20/2020   Procedure: REMOVAL OF STONES;  Surgeon: Rogene Houston, MD;  [facility];  Service: Endoscopy;  Laterality: N/A;   SPHINCTEROTOMY  03/03/2020   Procedure: BILLARY SPHINCTEROTOMY;  Surgeon: Rogene Houston, MD;  [facility];  Service: Endoscopy;;   SUPRAPUBIC CATHETER INSERTION      Family History  Problem Relation Age of Onset   Cirrhosis Brother        etoh   Stroke Mother 47   Coronary artery disease Father 63   Heart attack Brother    Cancer Sister    Multiple sclerosis Other    Colon cancer Neg Hx     Social history:  reports that he quit smoking about 33 years ago. His smoking use included cigarettes. He has a 25.00 pack-year smoking history. He has never used smokeless tobacco. He reports that he does not drink alcohol and does not use drugs.  Medications:  Prior to Admission medications   Medication Sig Start Date End Date Taking? Authorizing Provider  amLODipine (NORVASC) 5 MG tablet Take 1 tablet (5 mg total) by mouth daily. 02/18/21  Yes Nicole Kindred A, DO  amoxicillin (AMOXIL) 875 MG tablet Take 1 tablet (875 mg total) by mouth every 12 (twelve) hours. 03/05/21  Yes Dahlstedt, Annie Main, MD  diazepam (VALIUM) 5 MG tablet Take 1 tablet (5 mg total) by mouth every 12 (twelve) hours as needed for muscle spasms. 11/20/20  Yes Suzzanne Cloud, NP  HYDROcodone-acetaminophen (NORCO) 7.5-325 MG per tablet Take 1 tablet by mouth 2 (two) times daily. Max APAP 3 GM IN 24 HOURS FROM ALL SOURCES Patient taking  differently: Take 1 tablet by mouth in the morning and at bedtime. ALL MEDICATIONS TO BE CRUSHED AND PLACED IN APPLESAUCE 08/22/14  Yes Pandey, Mahima, MD  levothyroxine (SYNTHROID) 88 MCG tablet Take 88 mcg by mouth daily. 12/31/20  Yes [provider]  OXYGEN Inhale 1.5 L into the lungs every evening.   Yes [provider]      Allergies  Allergen Reactions   Tetracyclines & Related Anaphylaxis and Rash   Ciprofloxacin     Trouble swallowing unknown reaction according to wife     ROS:  Out of a complete 14 system review of symptoms, the patient complains only of the following symptoms, and all other reviewed systems are negative.  Tremors Fatigue Walking difficulty Left flank pain  Blood pressure 140/86, pulse 74, height 5\' 5"  (1.651 m), weight 114 lb 9.6 oz (52 kg).  Physical Exam  General: The patient is alert and cooperative at the time of the examination.  Eyes: Pupils are equal, round, and reactive to light. Discs are flat bilaterally.  Neck: The neck is supple, heart murmur radiation is heard in both carotids.  Respiratory: The respiratory examination is clear.  Cardiovascular: The cardiovascular examination reveals an irregular heart rhythm, with a grade III/VI systolic ejection murmur at the aortic area.  Skin: Extremities are without significant edema.  Neurologic Exam  Mental status: The patient is alert and oriented x 3 at the time of the examination. The patient has apparent normal recent and remote memory, with an apparently normal attention span and concentration ability.  Cranial nerves: Facial symmetry is present. There is good sensation of the face to pinprick and soft touch bilaterally. The strength of the facial muscles and the muscles to head turning and shoulder shrug are normal bilaterally. Speech is well enunciated, no aphasia or dysarthria is noted. Extraocular movements are full. Visual fields are full. The tongue is midline, and  the patient has symmetric elevation of the soft palate. No obvious hearing deficits are noted.  Motor: The motor testing reveals 5 over 5 strength of all 4 extremities, with exception of slight weakness with hip flexion bilaterally.  Good symmetric motor tone is noted throughout.  Sensory: Sensory testing is intact to pinprick, soft touch, vibration sensation, and position sense on the upper extremities.  With the lower extremities, pinprick and vibration sensation is symmetric but position sense is decreased in both feet.  No evidence of extinction is noted.  Coordination: Cerebellar testing reveals good finger-nose-finger and heel-to-shin bilaterally.  Gait and station: Gait is wide-based, he can walk independently but usually uses a walker.  Tandem gait was not attempted.  Romberg is negative.  Reflexes: Deep tendon reflexes are symmetric and normal bilaterally. Toes are downgoing bilaterally.   CT head 02/13/21:  IMPRESSION: 1. No definite CT evidence for acute intracranial abnormality, however patient is noted to have fairly extensive white matter disease which could potentially obscure acute lesions. MRI follow-up may be obtained as indicated. 2. Atrophy and chronic infarcts in the right white matter, right basal ganglia and right thalamus.    * CT scan images were reviewed online. I agree with the written report.    Assessment/Plan:  1.  History of multiple sclerosis  2.  Neurogenic bladder  3.  History of "tremors", likely rigors  4.  Chronic gait disorder  The patient has had 2 admissions to the hospital associated with pyelonephritis.  On the first admission, the patient was demonstrated to have a sepsis, but it is still possible he could have seeded bacteria on the second admission as well even though the blood cultures were negative.  The patient likely had rigors associated with gram-negative bacteria in the bloodstream with prolonged shaking chills.  The patient  indicates that he did feel chilled with the tremors.  There is no treatment for the above, the patient may have recurrence of his symptoms if he  becomes ill again with bacterial sepsis.  He has had some functional decline with his ability to walk and with general fatigue since the COVID infection in October, I have recommended that he get the vaccination.  He will follow-up here for his next appointment in 1 year.  Jill Alexanders MD 03/07/2021 12:02 PM  [facility] [address] [place], [place] [address]  Phone [phone] Fax [phone]

## 2021-03-08 DIAGNOSIS — N390 Urinary tract infection, site not specified: Secondary | ICD-10-CM | POA: Diagnosis not present

## 2021-03-08 DIAGNOSIS — Z435 Encounter for attention to cystostomy: Secondary | ICD-10-CM | POA: Diagnosis not present

## 2021-03-08 DIAGNOSIS — N319 Neuromuscular dysfunction of bladder, unspecified: Secondary | ICD-10-CM | POA: Diagnosis not present

## 2021-03-08 DIAGNOSIS — N136 Pyonephrosis: Secondary | ICD-10-CM | POA: Diagnosis not present

## 2021-03-08 DIAGNOSIS — Z466 Encounter for fitting and adjustment of urinary device: Secondary | ICD-10-CM | POA: Diagnosis not present

## 2021-03-08 DIAGNOSIS — Z436 Encounter for attention to other artificial openings of urinary tract: Secondary | ICD-10-CM | POA: Diagnosis not present

## 2021-03-11 DIAGNOSIS — Z466 Encounter for fitting and adjustment of urinary device: Secondary | ICD-10-CM | POA: Diagnosis not present

## 2021-03-11 DIAGNOSIS — N390 Urinary tract infection, site not specified: Secondary | ICD-10-CM | POA: Diagnosis not present

## 2021-03-11 DIAGNOSIS — N136 Pyonephrosis: Secondary | ICD-10-CM | POA: Diagnosis not present

## 2021-03-11 DIAGNOSIS — Z436 Encounter for attention to other artificial openings of urinary tract: Secondary | ICD-10-CM | POA: Diagnosis not present

## 2021-03-11 DIAGNOSIS — N319 Neuromuscular dysfunction of bladder, unspecified: Secondary | ICD-10-CM | POA: Diagnosis not present

## 2021-03-11 DIAGNOSIS — Z435 Encounter for attention to cystostomy: Secondary | ICD-10-CM | POA: Diagnosis not present

## 2021-03-14 ENCOUNTER — Ambulatory Visit: Payer: Medicare Other

## 2021-03-14 DIAGNOSIS — N136 Pyonephrosis: Secondary | ICD-10-CM | POA: Diagnosis not present

## 2021-03-14 DIAGNOSIS — N390 Urinary tract infection, site not specified: Secondary | ICD-10-CM | POA: Diagnosis not present

## 2021-03-14 DIAGNOSIS — Z466 Encounter for fitting and adjustment of urinary device: Secondary | ICD-10-CM | POA: Diagnosis not present

## 2021-03-14 DIAGNOSIS — Z435 Encounter for attention to cystostomy: Secondary | ICD-10-CM | POA: Diagnosis not present

## 2021-03-14 DIAGNOSIS — Z436 Encounter for attention to other artificial openings of urinary tract: Secondary | ICD-10-CM | POA: Diagnosis not present

## 2021-03-14 DIAGNOSIS — N319 Neuromuscular dysfunction of bladder, unspecified: Secondary | ICD-10-CM | POA: Diagnosis not present

## 2021-03-20 DIAGNOSIS — N136 Pyonephrosis: Secondary | ICD-10-CM | POA: Diagnosis not present

## 2021-03-20 DIAGNOSIS — N390 Urinary tract infection, site not specified: Secondary | ICD-10-CM | POA: Diagnosis not present

## 2021-03-20 DIAGNOSIS — J8410 Pulmonary fibrosis, unspecified: Secondary | ICD-10-CM | POA: Diagnosis not present

## 2021-03-20 DIAGNOSIS — E876 Hypokalemia: Secondary | ICD-10-CM | POA: Diagnosis not present

## 2021-03-20 DIAGNOSIS — I420 Dilated cardiomyopathy: Secondary | ICD-10-CM | POA: Diagnosis not present

## 2021-03-20 DIAGNOSIS — J9611 Chronic respiratory failure with hypoxia: Secondary | ICD-10-CM | POA: Diagnosis not present

## 2021-03-20 DIAGNOSIS — E039 Hypothyroidism, unspecified: Secondary | ICD-10-CM | POA: Diagnosis not present

## 2021-03-20 DIAGNOSIS — G35 Multiple sclerosis: Secondary | ICD-10-CM | POA: Diagnosis not present

## 2021-03-20 DIAGNOSIS — E785 Hyperlipidemia, unspecified: Secondary | ICD-10-CM | POA: Diagnosis not present

## 2021-03-20 DIAGNOSIS — N1832 Chronic kidney disease, stage 3b: Secondary | ICD-10-CM | POA: Diagnosis not present

## 2021-03-20 DIAGNOSIS — Z466 Encounter for fitting and adjustment of urinary device: Secondary | ICD-10-CM | POA: Diagnosis not present

## 2021-03-20 DIAGNOSIS — I5032 Chronic diastolic (congestive) heart failure: Secondary | ICD-10-CM | POA: Diagnosis not present

## 2021-03-20 DIAGNOSIS — Z435 Encounter for attention to cystostomy: Secondary | ICD-10-CM | POA: Diagnosis not present

## 2021-03-20 DIAGNOSIS — I130 Hypertensive heart and chronic kidney disease with heart failure and stage 1 through stage 4 chronic kidney disease, or unspecified chronic kidney disease: Secondary | ICD-10-CM | POA: Diagnosis not present

## 2021-03-20 DIAGNOSIS — I471 Supraventricular tachycardia: Secondary | ICD-10-CM | POA: Diagnosis not present

## 2021-03-20 DIAGNOSIS — N319 Neuromuscular dysfunction of bladder, unspecified: Secondary | ICD-10-CM | POA: Diagnosis not present

## 2021-03-20 DIAGNOSIS — R1310 Dysphagia, unspecified: Secondary | ICD-10-CM | POA: Diagnosis not present

## 2021-03-20 DIAGNOSIS — Z436 Encounter for attention to other artificial openings of urinary tract: Secondary | ICD-10-CM | POA: Diagnosis not present

## 2021-03-20 DIAGNOSIS — I2510 Atherosclerotic heart disease of native coronary artery without angina pectoris: Secondary | ICD-10-CM | POA: Diagnosis not present

## 2021-03-20 DIAGNOSIS — E1122 Type 2 diabetes mellitus with diabetic chronic kidney disease: Secondary | ICD-10-CM | POA: Diagnosis not present

## 2021-03-20 DIAGNOSIS — E870 Hyperosmolality and hypernatremia: Secondary | ICD-10-CM | POA: Diagnosis not present

## 2021-03-20 DIAGNOSIS — E1151 Type 2 diabetes mellitus with diabetic peripheral angiopathy without gangrene: Secondary | ICD-10-CM | POA: Diagnosis not present

## 2021-03-20 DIAGNOSIS — J449 Chronic obstructive pulmonary disease, unspecified: Secondary | ICD-10-CM | POA: Diagnosis not present

## 2021-03-20 DIAGNOSIS — N179 Acute kidney failure, unspecified: Secondary | ICD-10-CM | POA: Diagnosis not present

## 2021-03-20 DIAGNOSIS — G4733 Obstructive sleep apnea (adult) (pediatric): Secondary | ICD-10-CM | POA: Diagnosis not present

## 2021-03-21 DIAGNOSIS — Z435 Encounter for attention to cystostomy: Secondary | ICD-10-CM | POA: Diagnosis not present

## 2021-03-21 DIAGNOSIS — Z436 Encounter for attention to other artificial openings of urinary tract: Secondary | ICD-10-CM | POA: Diagnosis not present

## 2021-03-21 DIAGNOSIS — N136 Pyonephrosis: Secondary | ICD-10-CM | POA: Diagnosis not present

## 2021-03-21 DIAGNOSIS — N390 Urinary tract infection, site not specified: Secondary | ICD-10-CM | POA: Diagnosis not present

## 2021-03-21 DIAGNOSIS — N319 Neuromuscular dysfunction of bladder, unspecified: Secondary | ICD-10-CM | POA: Diagnosis not present

## 2021-03-21 DIAGNOSIS — Z466 Encounter for fitting and adjustment of urinary device: Secondary | ICD-10-CM | POA: Diagnosis not present

## 2021-03-22 ENCOUNTER — Other Ambulatory Visit: Payer: Self-pay | Admitting: Student

## 2021-03-22 DIAGNOSIS — Z435 Encounter for attention to cystostomy: Secondary | ICD-10-CM | POA: Diagnosis not present

## 2021-03-22 DIAGNOSIS — Z436 Encounter for attention to other artificial openings of urinary tract: Secondary | ICD-10-CM | POA: Diagnosis not present

## 2021-03-22 DIAGNOSIS — E0590 Thyrotoxicosis, unspecified without thyrotoxic crisis or storm: Secondary | ICD-10-CM | POA: Diagnosis not present

## 2021-03-22 DIAGNOSIS — N390 Urinary tract infection, site not specified: Secondary | ICD-10-CM | POA: Diagnosis not present

## 2021-03-22 DIAGNOSIS — Z466 Encounter for fitting and adjustment of urinary device: Secondary | ICD-10-CM | POA: Diagnosis not present

## 2021-03-22 DIAGNOSIS — N319 Neuromuscular dysfunction of bladder, unspecified: Secondary | ICD-10-CM | POA: Diagnosis not present

## 2021-03-22 DIAGNOSIS — N136 Pyonephrosis: Secondary | ICD-10-CM | POA: Diagnosis not present

## 2021-03-22 DIAGNOSIS — I10 Essential (primary) hypertension: Secondary | ICD-10-CM | POA: Diagnosis not present

## 2021-03-25 ENCOUNTER — Ambulatory Visit (HOSPITAL_COMMUNITY)
Admission: RE | Admit: 2021-03-25 | Discharge: 2021-03-25 | Disposition: A | Discharge status: Home / Self Care | Payer: Medicare Other | Source: Ambulatory Visit | Attending: Urology | Admitting: Urology

## 2021-03-25 ENCOUNTER — Encounter (HOSPITAL_COMMUNITY): Payer: Self-pay

## 2021-03-25 ENCOUNTER — Other Ambulatory Visit (HOSPITAL_COMMUNITY): Payer: Self-pay | Admitting: Diagnostic Radiology

## 2021-03-25 ENCOUNTER — Other Ambulatory Visit: Payer: Self-pay | Admitting: Urology

## 2021-03-25 ENCOUNTER — Other Ambulatory Visit: Payer: Self-pay

## 2021-03-25 DIAGNOSIS — Z881 Allergy status to other antibiotic agents status: Secondary | ICD-10-CM | POA: Diagnosis not present

## 2021-03-25 DIAGNOSIS — N1330 Unspecified hydronephrosis: Secondary | ICD-10-CM | POA: Diagnosis not present

## 2021-03-25 DIAGNOSIS — N201 Calculus of ureter: Secondary | ICD-10-CM

## 2021-03-25 DIAGNOSIS — N319 Neuromuscular dysfunction of bladder, unspecified: Secondary | ICD-10-CM | POA: Diagnosis not present

## 2021-03-25 DIAGNOSIS — Z436 Encounter for attention to other artificial openings of urinary tract: Secondary | ICD-10-CM | POA: Diagnosis not present

## 2021-03-25 DIAGNOSIS — Z79899 Other long term (current) drug therapy: Secondary | ICD-10-CM | POA: Insufficient documentation

## 2021-03-25 DIAGNOSIS — Z79890 Hormone replacement therapy (postmenopausal): Secondary | ICD-10-CM | POA: Insufficient documentation

## 2021-03-25 HISTORY — PX: IR NEPHROSTOMY EXCHANGE LEFT: IMG6069

## 2021-03-25 HISTORY — PX: IR URETERAL STENT PERC REMOVAL MOD SED: IMG2338

## 2021-03-25 LAB — CBC
HCT: 40.4 % (ref 39.0–52.0)
Hemoglobin: 13.2 g/dL (ref 13.0–17.0)
MCH: 29.8 pg (ref 26.0–34.0)
MCHC: 32.7 g/dL (ref 30.0–36.0)
MCV: 91.2 fL (ref 80.0–100.0)
Platelets: 198 10*3/uL (ref 150–400)
RBC: 4.43 MIL/uL (ref 4.22–5.81)
RDW: 13.5 % (ref 11.5–15.5)
WBC: 8.7 10*3/uL (ref 4.0–10.5)
nRBC: 0 % (ref 0.0–0.2)

## 2021-03-25 LAB — PROTIME-INR
INR: 1 (ref 0.8–1.2)
Prothrombin Time: 12.7 seconds (ref 11.4–15.2)

## 2021-03-25 MED ORDER — IOHEXOL 300 MG/ML  SOLN
25.0000 mL | Freq: Once | INTRAMUSCULAR | Status: AC | PRN
  Administered 2021-03-25: 10 mL

## 2021-03-25 MED ORDER — LIDOCAINE HCL 1 % IJ SOLN
INTRAMUSCULAR | Status: AC
  Filled 2021-03-25: qty 20

## 2021-03-25 MED ORDER — LIDOCAINE HCL (PF) 1 % IJ SOLN
INTRAMUSCULAR | Status: AC | PRN
  Administered 2021-03-25: 10 mL via INTRADERMAL

## 2021-03-25 MED ORDER — FENTANYL CITRATE (PF) 100 MCG/2ML IJ SOLN
INTRAMUSCULAR | Status: AC | PRN
  Administered 2021-03-25 (×3): 25 ug via INTRAVENOUS
  Administered 2021-03-25: 50 ug via INTRAVENOUS
  Administered 2021-03-25: 25 ug via INTRAVENOUS
  Administered 2021-03-25: 50 ug via INTRAVENOUS

## 2021-03-25 MED ORDER — FENTANYL CITRATE (PF) 100 MCG/2ML IJ SOLN
INTRAMUSCULAR | Status: AC
  Filled 2021-03-25: qty 2

## 2021-03-25 MED ORDER — SODIUM CHLORIDE 0.9 % IV SOLN
2.0000 g | INTRAVENOUS | Status: AC
  Administered 2021-03-25: 2 g via INTRAVENOUS
  Filled 2021-03-25 (×2): qty 20

## 2021-03-25 MED ORDER — MIDAZOLAM HCL 2 MG/2ML IJ SOLN
INTRAMUSCULAR | Status: AC | PRN
  Administered 2021-03-25 (×2): 1 mg via INTRAVENOUS

## 2021-03-25 MED ORDER — MIDAZOLAM HCL 2 MG/2ML IJ SOLN
INTRAMUSCULAR | Status: AC
  Filled 2021-03-25: qty 2

## 2021-03-25 MED ORDER — SODIUM CHLORIDE 0.9 % IV SOLN: INTRAVENOUS | Status: DC

## 2021-03-25 NOTE — H&P (Signed)
Referring Physician(s): [place]  Supervising Physician: Markus Daft  Patient Status:  [facility]  Chief Complaint:  "I'm having my kidney tube changed out"   Subjective: Patient familiar to IR service from left nephrostomy in 2016, hemodialysis catheter placement in 2016, left nephrostomy in 2018 as well as 2021 and PICC and left nephrostomy again on 02/15/2021.  He has a history of MS with neurogenic bladder and chronic suprapubic catheter placement, right renal atrophy and left nephrolithiasis/hydronephrosis despite ureteral stent.  He had partial cystoscopic removal of distal portion of the stent but proximal portion remains in the upper urinary tract and is calcified.  He presents today for nephrostomy exchange and possible ureteral stent removal.  He currently denies fever, headache, chest pain, dyspnea, cough, abdominal pain, nausea, vomiting or bleeding.  He does have some discomfort at left nephrostomy insertion site.  Additional history as below.  Past Medical History:  Diagnosis Date   Anemia    Arthritis    Back pain, chronic    Bilateral carotid bruits    C. difficile colitis 09/2011   CAD (coronary artery disease)    Carotid artery stenosis    Cerebrovascular disease    Cerebrovascular disease 08/17/2018   Colon polyps    Complication of cystostomy catheter, initial encounter ([place]) 04/20/2020   Dyslipidemia    Dysphagia 10/07/2011   Encephalopathy    Gait disorder    HA (headache)    High grade dysplasia in colonic adenoma 09/2005   History of kidney stones    HTN (hypertension), malignant 10/06/2011   Hypernatremia    Hypokalemia    Hypothyroidism 10/08/2011   Insomnia    Junctional rhythm    Kidney stones    MS (multiple sclerosis) (HCC)    Neuromuscular disorder (HCC)    MS   OSA (obstructive sleep apnea)    Paroxysmal atrial tachycardia ([place])    Peripheral vascular disease ([place])    Pneumonia 4 yrs ago   Pulmonary fibrosis ([place]) 10/06/2011   Pulmonary  nodule 10/08/2011   PVD (peripheral vascular disease) ([place])    Sacral ulcer (HCC)    Sleep apnea    cannot tolerate   Stroke ([place])    left sided weakness   Suprapubic catheter ([place])    TIA (transient ischemic attack)    Tremors of nervous system 10/08/2011   Urinary tract infection    Ventral hernia without obstruction or gangrene     Large 8X9cm ventral hernia with loss of domain. CT reads report as diastasis recti with herniation or diastasis recti.  Dr. Constance Haw, Surgery, reviewed CT with radiology and there is herniation with only hernia sac or peritoneum over the bowel and large separation of the rectus muslce (i.e. diastasis recti aka loss of domain).  No surgical intervention recommended given size, age, and health.    Past Surgical History:  Procedure Laterality Date   APPENDECTOMY  09/2005   at time of left hemicolectomy   BACK SURGERY  1976/1979   lower   BILIARY DILATION N/A 03/03/2020   Procedure: BILIARY DILATION;  Surgeon: Rogene Houston, MD;  [facility];  Service: Endoscopy;  Laterality: N/A;   CATARACT EXTRACTION W/PHACO Right 03/08/2018   Procedure: CATARACT EXTRACTION PHACO AND INTRAOCULAR LENS PLACEMENT RIGHT EYE;  Surgeon: Tonny Branch, MD;  Location: [facility];  Service: Ophthalmology;  Laterality: Right;  CDE: 8.86   CATARACT EXTRACTION W/PHACO Left 04/05/2018   Procedure: CATARACT EXTRACTION PHACO AND INTRAOCULAR LENS PLACEMENT (IOC);  Surgeon:  Tonny Branch, MD;  Location: [facility];  Service: Ophthalmology;  Laterality: Left;  CDE: 7.36   CENTRAL LINE INSERTION-TUNNELED Right 09/11/2020   Procedure: PLACEMENT OF TUNNELED CENTRAL LINE INTO JUGULAR VEIN;  Surgeon: Virl Cagey, MD;  Location: [facility];  Service: General;  Laterality: Right;   CHOLECYSTECTOMY     Dr. Tamala Julian   COLON SURGERY  09/2005   [place]: four tubular adenomas, large adenomatous polyp with HIGH GRADE dysplasia   COLONOSCOPY  11/2004   Dr. Sharol Roussel sessile polyp splenic flexure, 79mm  sessile polyp desc colon, tubulovillous adenoma (bx not removed)   COLONOSCOPY  01/2005   poor prep, polyp could not be found   COLONOSCOPY  05/2005   with EMR, polypectomy Dr. Olegario Messier, bx showed high grade dysplasia, partially resected   COLONOSCOPY  09/2005   Dr. Arsenio Loader, [place] ink tattooing, four villous colon polyp (3 had been missed on previous colonoscopies due to limitations of procedures   COLONOSCOPY  09/2006   normal TI, no polyps   COLONOSCOPY  10/2007   Dr. Imogene Burn distal mammillations, benign bx, normal TI, random bx neg for microscopic colitis   CYSTOSCOPY W/ URETERAL STENT PLACEMENT Left 09/05/2020   Procedure: CYSTOSCOPY WITH RETROGRADE PYELOGRAM/URETERAL STENT PLACEMENT;  Surgeon: Ardis Hughs, MD;  Location: [facility];  Service: Urology;  Laterality: Left;   CYSTOSCOPY WITH LITHOLAPAXY N/A 07/27/2018   Procedure: CYSTOSCOPY WITH LITHOLAPAXY VIA  SUPRAPUBIC TUBE;  Surgeon: Franchot Gallo, MD;  Location: [facility];  Service: Urology;  Laterality: N/A;   CYSTOSCOPY WITH RETROGRADE PYELOGRAM, URETEROSCOPY AND STENT PLACEMENT Left 06/09/2017   Procedure: CYSTOSCOPY WITH LEFT RETROGRADE PYELOGRAM, LEFT URETEROSCOPY, LEFT URETEROSCOPIC STONE EXTRACTION, LEFT URETERAL STENT PLACEMENT;  Surgeon: Franchot Gallo, MD;  Location: [facility];  Service: Urology;  Laterality: Left;   CYSTOSCOPY/URETEROSCOPY/HOLMIUM LASER/STENT PLACEMENT Left 05/22/2020   Procedure: CYSTOSCOPY/URETEROSCOPY/STENT PLACEMENT;  Surgeon: Franchot Gallo, MD;  Location: [facility];  Service: Urology;  Laterality: Left;   ERCP N/A 03/03/2020   Procedure: ENDOSCOPIC RETROGRADE CHOLANGIOPANCREATOGRAPHY (ERCP);  Surgeon: Rogene Houston, MD;  [facility];  Service: Endoscopy;  Laterality: N/A;   ERCP N/A 04/20/2020   Procedure: ENDOSCOPIC RETROGRADE CHOLANGIOPANCREATOGRAPHY (ERCP);  Surgeon: Rogene Houston, MD;  [facility];  Service: Endoscopy;  Laterality: N/A;  to be done at 7:30am in  [place] N/A 04/20/2020   Procedure: STENT REMOVAL;  Surgeon: Rogene Houston, MD;  [facility];  Service: Endoscopy;  Laterality: N/A;   INGUINAL HERNIA REPAIR  1971   bilateral   INSERTION OF SUPRAPUBIC CATHETER  06/09/2017   Procedure: EXCHANGE OF SUPRAPUBIC CATHETER;  Surgeon: Franchot Gallo, MD;  Location: [facility];  Service: Urology;;   INSERTION OF SUPRAPUBIC CATHETER  05/22/2020   Procedure: SUPRAPUBIC CATHETER EXCHANGE;  Surgeon: Franchot Gallo, MD;  Location: [facility];  Service: Urology;;   IR NEPHROSTOMY PLACEMENT LEFT  05/26/2017   IR NEPHROSTOMY PLACEMENT LEFT  05/02/2020   IR NEPHROSTOMY PLACEMENT LEFT  02/15/2021   KIDNEY STONE SURGERY  09/13/2015   LITHOTRIPSY N/A 03/03/2020   Procedure: MECHANICAL LITHOTRIPSY WITH REMOVAL OF MULTIPLE STONE FRAGMENTS;  Surgeon: Rogene Houston, MD;  [facility];  Service: Endoscopy;  Laterality: N/A;   NEPHROLITHOTOMY Left 09/13/2015   Procedure: LEFT PERCUTANEOUS NEPHROLITHOTOMY ;  Surgeon: Franchot Gallo, MD;  Location: [facility];  Service: Urology;  Laterality: Left;   NEPHROSTOMY TUBE REMOVAL  05/22/2020   Procedure: NEPHROSTOMY TUBE REMOVAL;  Surgeon: [name],  Annie Main, MD;  Location: [facility];  Service: Urology;;   [facility] REMOVAL Right 09/20/2020   Procedure: MINOR REMOVAL CENTRAL LINE;  Surgeon: Virl Cagey, MD;  Location: [facility];  Service: General;  Laterality: Right;  Pt to arrive at 7:30am for procedure   REMOVAL OF STONES N/A 04/20/2020   Procedure: REMOVAL OF STONES;  Surgeon: Rogene Houston, MD;  [facility];  Service: Endoscopy;  Laterality: N/A;   SPHINCTEROTOMY  03/03/2020   Procedure: BILLARY SPHINCTEROTOMY;  Surgeon: Rogene Houston, MD;  [facility];  Service: Endoscopy;;   SUPRAPUBIC CATHETER INSERTION        Allergies: Tetracyclines & related and Ciprofloxacin  Medications: Prior to Admission medications   Medication Sig Start Date  End Date Taking? Authorizing Provider  amLODipine (NORVASC) 5 MG tablet Take 1 tablet (5 mg total) by mouth daily. 02/18/21   Ezekiel Slocumb, DO  amoxicillin (AMOXIL) 875 MG tablet Take 1 tablet (875 mg total) by mouth every 12 (twelve) hours. 03/05/21   Franchot Gallo, MD  diazepam (VALIUM) 5 MG tablet Take 1 tablet (5 mg total) by mouth every 12 (twelve) hours as needed for muscle spasms. 11/20/20   Suzzanne Cloud, NP  HYDROcodone-acetaminophen (NORCO) 7.5-325 MG per tablet Take 1 tablet by mouth 2 (two) times daily. Max APAP 3 GM IN 24 HOURS FROM ALL SOURCES Patient taking differently: Take 1 tablet by mouth in the morning and at bedtime. ALL MEDICATIONS TO BE CRUSHED AND PLACED IN APPLESAUCE 08/22/14   Blanchie Serve, MD  levothyroxine (SYNTHROID) 88 MCG tablet Take 88 mcg by mouth daily. 12/31/20   [provider]  OXYGEN Inhale 1.5 L into the lungs every evening.    [provider]     Vital Signs: BP (!) 184/87   Pulse 70   Temp 98.6 F (37 C) (Oral)   Resp 20   SpO2 99%   Physical Exam awake, alert.  Chest clear to auscultation bilaterally.  Heart with regular rate and rhythm.  Abdomen soft, positive bowel sounds, intact suprapubic catheter, left nephrostomy intact, insertion site mildly tender, yellow urine in bag.  No lower extremity edema.  Imaging: No results found.  Labs:  CBC: Recent Labs    02/13/21 1837 02/14/21 0426 02/15/21 0413 02/17/21 0445  WBC 10.8* 14.8* 9.7 7.6  HGB 13.4 13.7 12.5* 10.9*  HCT 41.6 42.6 40.9 35.1*  PLT 235 186 164 143*    COAGS: Recent Labs    05/02/20 1718 02/15/21 0906  INR 1.0 1.2  APTT 28  --     BMP: Recent Labs    05/04/20 0504 05/05/20 0544 05/06/20 0556 05/07/20 0521 07/03/20 1401 02/14/21 0426 02/15/21 0413 02/16/21 0611 02/17/21 0445  NA 144 140 140 134*   < > 145 153* 143 144  K 2.9* 3.0* 3.9 3.3*   < > 3.8 3.9 2.9* 3.5  CL 109 105 104 98   < > 110 121* 113* 112*  CO2 25 24 26 25    < >  26 27 24 24   GLUCOSE 92 118* 101* 103*   < > 100* 89 125* 103*  BUN 30* 24* 20 25*   < > 44* 51* 36* 30*  CALCIUM 8.2* 8.3* 8.6* 8.6*   < > 9.4 8.9 8.3* 8.6*  CREATININE 2.09* 1.54* 1.37* 1.74*   < > 3.33* 3.16* 2.60* 2.37*  GFRNONAA 30* 43* 49* 37*   < > 18* 19* 25* 28*  GFRAA 34*  50* 57* 43*  --   --   --   --   --    < > = values in this interval not displayed.    LIVER FUNCTION TESTS: Recent Labs    07/10/20 0414 07/11/20 0423 07/12/20 0426 09/11/20 0513 02/13/21 1837  BILITOT 0.4 0.7 0.5  --  0.6  AST 19 17 16   --  21  ALT 19 19 21   --  12  ALKPHOS 62 62 58  --  115  PROT 5.4* 5.8* 5.4*  --  8.1  ALBUMIN 2.3* 2.4* 2.3* 2.7* 4.0    Assessment and Plan: Patient familiar to IR service from left nephrostomy in 2016, hemodialysis catheter placement in 2016, left nephrostomy in 2018 as well as 2021 and PICC and left nephrostomy again on 02/15/2021.  He has a history of MS with neurogenic bladder and chronic suprapubic catheter placement, right renal atrophy and left nephrolithiasis/hydronephrosis despite ureteral stent.  He had partial cystoscopic removal of distal portion of the stent but proximal portion remains in the upper urinary tract and is calcified.  He presents today for nephrostomy exchange and possible ureteral stent removal.  Details/risks of procedures, including but not limited to, internal bleeding, infection, injury to adjacent structures, inability to remove stent discussed with patient with his understanding and consent.   Electronically Signed: D. Rowe Robert, PA-C 03/25/2021, 10:20 AM   I spent a total of 20 minutes at the the patient's bedside AND on the patient's hospital floor or unit, greater than 50% of which was counseling/coordinating care for left nephrostogram with nephrostomy exchange and possible ureteral stent removal

## 2021-03-25 NOTE — Procedures (Signed)
Interventional Radiology Procedure:   Indications: Neurogenic bladder with SP tube and left nephrostomy.  Ureter stent could not be removed from retrograde approach.  Needs nephrostomy tube exchange and stent removal.  Procedure: 1) Removal of left ureter stent  2) Exchange left nephrostomy tube  Findings: Ureter stent was in upper pole calyx.   Stent was successfully snared and removed.  New 10 Fr nephrostomy in renal pelvis.  Complications: None     EBL: less than 10 ml  Plan: Will need routine nephrostomy tube exchanges.     Eisha Chatterjee R. Anselm Pancoast, MD  Pager: [phone]

## 2021-03-25 NOTE — Discharge Instructions (Signed)
Interventional radiology phone numbers °[phone] °After hours [phone] ° ° ° °Moderate Conscious Sedation, Adult, Care After °This sheet gives you information about how to care for yourself after your procedure. Your health care provider may also give you more specific instructions. If you have problems or questions, contact your health care provider. °What can I expect after the procedure? °After the procedure, it is common to have: °Sleepiness for several hours. °Impaired judgment for several hours. °Difficulty with balance. °Vomiting if you eat too soon. °Follow these instructions at home: °For the time period you were told by your health care provider: °Rest. °Do not participate in activities where you could fall or become injured. °Do not drive or use machinery. °Do not drink alcohol. °Do not take sleeping pills or medicines that cause drowsiness. °Do not make important decisions or sign legal documents. °Do not take care of children on your own.  °  °  °Eating and drinking °Follow the diet recommended by your health care provider. °Drink enough fluid to keep your urine pale yellow. °If you vomit: °Drink water, juice, or soup when you can drink without vomiting. °Make sure you have little or no nausea before eating solid foods.   °General instructions °Take over-the-counter and prescription medicines only as told by your health care provider. °Have a responsible adult stay with you for the time you are told. It is important to have someone help care for you until you are awake and alert. °Do not smoke. °Keep all follow-up visits as told by your health care provider. This is important. °Contact a health care provider if: °You are still sleepy or having trouble with balance after 24 hours. °You feel light-headed. °You keep feeling nauseous or you keep vomiting. °You develop a rash. °You have a fever. °You have redness or swelling around the IV site. °Get help right away if: °You have trouble  breathing. °You have new-onset confusion at home. °Summary °After the procedure, it is common to feel sleepy, have impaired judgment, or feel nauseous if you eat too soon. °Rest after you get home. Know the things you should not do after the procedure. °Follow the diet recommended by your health care provider and drink enough fluid to keep your urine pale yellow. °Get help right away if you have trouble breathing or new-onset confusion at home. °This information is not intended to replace advice given to you by your health care provider. Make sure you discuss any questions you have with your health care provider. °Document Revised: 01/13/2020 Document Reviewed: 08/11/2019 °Elsevier Patient Education © 2021 [employer]  °

## 2021-03-27 DIAGNOSIS — Z466 Encounter for fitting and adjustment of urinary device: Secondary | ICD-10-CM | POA: Diagnosis not present

## 2021-03-27 DIAGNOSIS — N390 Urinary tract infection, site not specified: Secondary | ICD-10-CM | POA: Diagnosis not present

## 2021-03-27 DIAGNOSIS — Z435 Encounter for attention to cystostomy: Secondary | ICD-10-CM | POA: Diagnosis not present

## 2021-03-27 DIAGNOSIS — N136 Pyonephrosis: Secondary | ICD-10-CM | POA: Diagnosis not present

## 2021-03-27 DIAGNOSIS — Z436 Encounter for attention to other artificial openings of urinary tract: Secondary | ICD-10-CM | POA: Diagnosis not present

## 2021-03-27 DIAGNOSIS — N319 Neuromuscular dysfunction of bladder, unspecified: Secondary | ICD-10-CM | POA: Diagnosis not present

## 2021-03-28 DIAGNOSIS — I10 Essential (primary) hypertension: Secondary | ICD-10-CM | POA: Diagnosis not present

## 2021-03-28 DIAGNOSIS — G894 Chronic pain syndrome: Secondary | ICD-10-CM | POA: Diagnosis not present

## 2021-03-28 DIAGNOSIS — Z436 Encounter for attention to other artificial openings of urinary tract: Secondary | ICD-10-CM | POA: Diagnosis not present

## 2021-03-28 DIAGNOSIS — N1832 Chronic kidney disease, stage 3b: Secondary | ICD-10-CM | POA: Diagnosis not present

## 2021-03-28 DIAGNOSIS — Z466 Encounter for fitting and adjustment of urinary device: Secondary | ICD-10-CM | POA: Diagnosis not present

## 2021-03-28 DIAGNOSIS — Z435 Encounter for attention to cystostomy: Secondary | ICD-10-CM | POA: Diagnosis not present

## 2021-03-28 DIAGNOSIS — N390 Urinary tract infection, site not specified: Secondary | ICD-10-CM | POA: Diagnosis not present

## 2021-03-28 DIAGNOSIS — G35 Multiple sclerosis: Secondary | ICD-10-CM | POA: Diagnosis not present

## 2021-03-28 DIAGNOSIS — N319 Neuromuscular dysfunction of bladder, unspecified: Secondary | ICD-10-CM | POA: Diagnosis not present

## 2021-03-28 DIAGNOSIS — N2889 Other specified disorders of kidney and ureter: Secondary | ICD-10-CM | POA: Diagnosis not present

## 2021-03-28 DIAGNOSIS — E876 Hypokalemia: Secondary | ICD-10-CM | POA: Diagnosis not present

## 2021-03-28 DIAGNOSIS — E039 Hypothyroidism, unspecified: Secondary | ICD-10-CM | POA: Diagnosis not present

## 2021-03-28 DIAGNOSIS — N136 Pyonephrosis: Secondary | ICD-10-CM | POA: Diagnosis not present

## 2021-03-28 DIAGNOSIS — K8010 Calculus of gallbladder with chronic cholecystitis without obstruction: Secondary | ICD-10-CM | POA: Diagnosis not present

## 2021-03-28 DIAGNOSIS — Z936 Other artificial openings of urinary tract status: Secondary | ICD-10-CM | POA: Diagnosis not present

## 2021-03-28 DIAGNOSIS — R945 Abnormal results of liver function studies: Secondary | ICD-10-CM | POA: Diagnosis not present

## 2021-04-02 ENCOUNTER — Ambulatory Visit (INDEPENDENT_AMBULATORY_CARE_PROVIDER_SITE_OTHER): Payer: Medicare Other

## 2021-04-02 ENCOUNTER — Other Ambulatory Visit: Payer: Self-pay

## 2021-04-02 DIAGNOSIS — N1330 Unspecified hydronephrosis: Secondary | ICD-10-CM | POA: Diagnosis not present

## 2021-04-02 NOTE — Progress Notes (Signed)
Suprapubic Cath Change ° °Patient is present today for a suprapubic catheter change due to urinary retention.  10ml of water was drained from the balloon, a 24FR foley cath was removed from the tract with out difficulty.  Site was cleaned and prepped in a sterile fashion with betadine.  A 24FR foley cath was replaced into the tract no complications were noted. Urine return was noted, 10 ml of sterile water was inflated into the balloon and a leg bag was attached for drainage.  Patient tolerated well.    ° °Performed by: Real Cona LPN ° °Follow up: Keep next scheduled NV °

## 2021-04-02 NOTE — Patient Instructions (Signed)
Foley Catheter Care and Patient Education  Perform catheter care every day.  You can do this while in the shower, but NOT while taking a tub bath.  You will need the following supplies: -mild soap, such as Dove -water -a clean washcloth (not one already used for bathing) or a 4x4 piece of gauze -1 Cath-Secure -night drainage bag -2 alcohol swabs  Was you hands thoroughly with soap and water Using mild soap and water, clan your genital area Men should retract the foreskin, if needed, and clean the area, including the penis Women should separate the labia, and clean the area from front to back  3. Clean your urinary opening, which is where the catheter enters your body. 4. Clean the catheter from where it enters your body and then down, away from your body.  Hold the catheter at the point it enters your body so that you don't put tension on it. 5. Rinse the area well and dry it gently.  Changing the drainage bag You will change your drainage bag twice a day -in the morning after you shower, change he night bag to the leg bag -at night before you go to bed, change the leg bag to the night bag  Wash your hands thoroughly with soap and water Empty the urine from the drainage bag into the toilet before you change it Pinch off the catheter with your fingers and disconnect the used bag Wipe the end of the catheter using an alcohol pad Wipe the connector on the bag using the second alcohol pad Connect the clean bag to the catheter and release your finger pinch Check all connections.  Straighten any kinks or twits in the tubing  Caring for the Leg bag -always wear the leg bag below your knee.  This will help it drain. -keep the leg bag secure with the velcro straps.  If the straps leave a mark on your leg they are to tight and should be loosened.  Leaving the straps too tight can decrease you circulation and lead to blood clots. -empty the leg bag through the spout at the bottom every  2-4 hours as needed.  Do not let the bag become completely full. -do not lie down for longer than 2 hours while you are wearing the leg bag.  Caring for the Night Bag -always keep the night bag below the level of your bladder . -to hang your night bag while you sleep, place a clean plastic bag inside of a wastebasket.  Hang the night bag on the inside of the wastebasket.  Cleaning the Drainage bag Wash you hands thoroughly with soap and water. Rinse the equipment with cool water.  Do not use hot water it can damage the plastic equipment. Was the equipment with a mild liquid detergent (ivory) and rinse with cool water. To decrease odor, fill the bag halfway with a mixture of 1 part vinegar and 3 parts water. Shake the bag and let it sit for 15 minutes. Rinse the bag with cool water and hang it up to dry.  Preventing infection -keep the drainage bag below the level of your bladder and off the floor at all times. -keep the catheter secured to your thigh to prevent it from moving. -do not lie on or block the flow of urine in the tubing. -shower daily to keep the catheter clean. -clean your hands before and after touching the catheter or bag. -the spout of the drainage bag should never touch the side of   the toilet or any emptying container.  Special Points -You may see some blood or urine around where the catheter enters your body, especially when walking or having a bowel movement.  This is normal, as long as there is urine draining into the drainage bag.  If you experience significant leakage around  catheter tube where it enters your urethra possibly associated with lower abdominal cramping you could be having a bladder spasm.  Please notify your doctor and we can prescribe you a medication to improve your symptoms. -drink 1-2 glasses of liquids every 2 hours while you're awake.  Call your doctor immediately if -your catheter comes out, do not try to replace it yourself -you have  temperature of 101F (38.8C) or higher -you have decrease in the amount of urine you are making -you have foul-smelling urine -you have bright red blood or large blood clots in your urine -you have abdominal pain and no urine in your catheter bag   

## 2021-04-04 DIAGNOSIS — I10 Essential (primary) hypertension: Secondary | ICD-10-CM | POA: Diagnosis not present

## 2021-04-05 DIAGNOSIS — N319 Neuromuscular dysfunction of bladder, unspecified: Secondary | ICD-10-CM | POA: Diagnosis not present

## 2021-04-05 DIAGNOSIS — N390 Urinary tract infection, site not specified: Secondary | ICD-10-CM | POA: Diagnosis not present

## 2021-04-05 DIAGNOSIS — Z466 Encounter for fitting and adjustment of urinary device: Secondary | ICD-10-CM | POA: Diagnosis not present

## 2021-04-05 DIAGNOSIS — Z435 Encounter for attention to cystostomy: Secondary | ICD-10-CM | POA: Diagnosis not present

## 2021-04-05 DIAGNOSIS — N136 Pyonephrosis: Secondary | ICD-10-CM | POA: Diagnosis not present

## 2021-04-05 DIAGNOSIS — Z436 Encounter for attention to other artificial openings of urinary tract: Secondary | ICD-10-CM | POA: Diagnosis not present

## 2021-04-09 ENCOUNTER — Other Ambulatory Visit: Payer: Self-pay | Admitting: Neurology

## 2021-04-12 DIAGNOSIS — Z435 Encounter for attention to cystostomy: Secondary | ICD-10-CM | POA: Diagnosis not present

## 2021-04-12 DIAGNOSIS — N136 Pyonephrosis: Secondary | ICD-10-CM | POA: Diagnosis not present

## 2021-04-12 DIAGNOSIS — Z436 Encounter for attention to other artificial openings of urinary tract: Secondary | ICD-10-CM | POA: Diagnosis not present

## 2021-04-12 DIAGNOSIS — N390 Urinary tract infection, site not specified: Secondary | ICD-10-CM | POA: Diagnosis not present

## 2021-04-12 DIAGNOSIS — Z466 Encounter for fitting and adjustment of urinary device: Secondary | ICD-10-CM | POA: Diagnosis not present

## 2021-04-12 DIAGNOSIS — N319 Neuromuscular dysfunction of bladder, unspecified: Secondary | ICD-10-CM | POA: Diagnosis not present

## 2021-04-13 DIAGNOSIS — Z435 Encounter for attention to cystostomy: Secondary | ICD-10-CM | POA: Diagnosis not present

## 2021-04-13 DIAGNOSIS — Z466 Encounter for fitting and adjustment of urinary device: Secondary | ICD-10-CM | POA: Diagnosis not present

## 2021-04-13 DIAGNOSIS — N319 Neuromuscular dysfunction of bladder, unspecified: Secondary | ICD-10-CM | POA: Diagnosis not present

## 2021-04-13 DIAGNOSIS — N136 Pyonephrosis: Secondary | ICD-10-CM | POA: Diagnosis not present

## 2021-04-13 DIAGNOSIS — Z436 Encounter for attention to other artificial openings of urinary tract: Secondary | ICD-10-CM | POA: Diagnosis not present

## 2021-04-13 DIAGNOSIS — N390 Urinary tract infection, site not specified: Secondary | ICD-10-CM | POA: Diagnosis not present

## 2021-04-18 DIAGNOSIS — N390 Urinary tract infection, site not specified: Secondary | ICD-10-CM | POA: Diagnosis not present

## 2021-04-18 DIAGNOSIS — Z436 Encounter for attention to other artificial openings of urinary tract: Secondary | ICD-10-CM | POA: Diagnosis not present

## 2021-04-18 DIAGNOSIS — N319 Neuromuscular dysfunction of bladder, unspecified: Secondary | ICD-10-CM | POA: Diagnosis not present

## 2021-04-18 DIAGNOSIS — Z466 Encounter for fitting and adjustment of urinary device: Secondary | ICD-10-CM | POA: Diagnosis not present

## 2021-04-18 DIAGNOSIS — Z435 Encounter for attention to cystostomy: Secondary | ICD-10-CM | POA: Diagnosis not present

## 2021-04-18 DIAGNOSIS — N136 Pyonephrosis: Secondary | ICD-10-CM | POA: Diagnosis not present

## 2021-04-30 ENCOUNTER — Ambulatory Visit: Payer: Medicare Other

## 2021-05-02 ENCOUNTER — Other Ambulatory Visit: Payer: Self-pay

## 2021-05-02 ENCOUNTER — Ambulatory Visit (INDEPENDENT_AMBULATORY_CARE_PROVIDER_SITE_OTHER): Payer: Medicare Other

## 2021-05-02 DIAGNOSIS — N1330 Unspecified hydronephrosis: Secondary | ICD-10-CM | POA: Diagnosis not present

## 2021-05-02 NOTE — Progress Notes (Signed)
Suprapubic Cath Change  Patient is present today for a suprapubic catheter change due to urinary retention.  80ml of water was drained from the balloon, a 24FR foley cath was removed from the tract without difficulty.  Site was cleaned and prepped in a sterile fashion with betadine.  A 24FR foley cath was replaced into the tract no complications were noted. Urine return was noted, 10 ml of sterile water was inflated into the balloon and a leg bag was attached for drainage.  Patient tolerated well. A night bag was given to patient and proper instruction was given on how to switch bags.    Performed by: Sheli Dorin LPN  Follow up: Keep next scheduled NV

## 2021-05-02 NOTE — Patient Instructions (Signed)
Foley Catheter Care and Patient Education  Perform catheter care every day.  You can do this while in the shower, but NOT while taking a tub bath.  You will need the following supplies: -mild soap, such as Dove -water -a clean washcloth (not one already used for bathing) or a 4x4 piece of gauze -1 Cath-Secure -night drainage bag -2 alcohol swabs  Was you hands thoroughly with soap and water Using mild soap and water, clan your genital area Men should retract the foreskin, if needed, and clean the area, including the penis Women should separate the labia, and clean the area from front to back  3. Clean your urinary opening, which is where the catheter enters your body. 4. Clean the catheter from where it enters your body and then down, away from your body.  Hold the catheter at the point it enters your body so that you don't put tension on it. 5. Rinse the area well and dry it gently.  Changing the drainage bag You will change your drainage bag twice a day -in the morning after you shower, change he night bag to the leg bag -at night before you go to bed, change the leg bag to the night bag  Wash your hands thoroughly with soap and water Empty the urine from the drainage bag into the toilet before you change it Pinch off the catheter with your fingers and disconnect the used bag Wipe the end of the catheter using an alcohol pad Wipe the connector on the bag using the second alcohol pad Connect the clean bag to the catheter and release your finger pinch Check all connections.  Straighten any kinks or twits in the tubing  Caring for the Leg bag -always wear the leg bag below your knee.  This will help it drain. -keep the leg bag secure with the velcro straps.  If the straps leave a mark on your leg they are to tight and should be loosened.  Leaving the straps too tight can decrease you circulation and lead to blood clots. -empty the leg bag through the spout at the bottom every  2-4 hours as needed.  Do not let the bag become completely full. -do not lie down for longer than 2 hours while you are wearing the leg bag.  Caring for the Night Bag -always keep the night bag below the level of your bladder . -to hang your night bag while you sleep, place a clean plastic bag inside of a wastebasket.  Hang the night bag on the inside of the wastebasket.  Cleaning the Drainage bag Wash you hands thoroughly with soap and water. Rinse the equipment with cool water.  Do not use hot water it can damage the plastic equipment. Was the equipment with a mild liquid detergent (ivory) and rinse with cool water. To decrease odor, fill the bag halfway with a mixture of 1 part vinegar and 3 parts water. Shake the bag and let it sit for 15 minutes. Rinse the bag with cool water and hang it up to dry.  Preventing infection -keep the drainage bag below the level of your bladder and off the floor at all times. -keep the catheter secured to your thigh to prevent it from moving. -do not lie on or block the flow of urine in the tubing. -shower daily to keep the catheter clean. -clean your hands before and after touching the catheter or bag. -the spout of the drainage bag should never touch the side of   the toilet or any emptying container.  Special Points -You may see some blood or urine around where the catheter enters your body, especially when walking or having a bowel movement.  This is normal, as long as there is urine draining into the drainage bag.  If you experience significant leakage around  catheter tube where it enters your urethra possibly associated with lower abdominal cramping you could be having a bladder spasm.  Please notify your doctor and we can prescribe you a medication to improve your symptoms. -drink 1-2 glasses of liquids every 2 hours while you're awake.  Call your doctor immediately if -your catheter comes out, do not try to replace it yourself -you have  temperature of 101F (38.8C) or higher -you have decrease in the amount of urine you are making -you have foul-smelling urine -you have bright red blood or large blood clots in your urine -you have abdominal pain and no urine in your catheter bag   

## 2021-05-20 ENCOUNTER — Other Ambulatory Visit: Payer: Self-pay

## 2021-05-20 ENCOUNTER — Ambulatory Visit (HOSPITAL_COMMUNITY)
Admission: RE | Admit: 2021-05-20 | Discharge: 2021-05-20 | Disposition: A | Discharge status: Home / Self Care | Payer: Medicare Other | Source: Ambulatory Visit | Attending: Diagnostic Radiology | Admitting: Diagnostic Radiology

## 2021-05-20 ENCOUNTER — Other Ambulatory Visit (HOSPITAL_COMMUNITY): Payer: Self-pay | Admitting: Interventional Radiology

## 2021-05-20 DIAGNOSIS — N319 Neuromuscular dysfunction of bladder, unspecified: Secondary | ICD-10-CM | POA: Diagnosis not present

## 2021-05-20 DIAGNOSIS — Z436 Encounter for attention to other artificial openings of urinary tract: Secondary | ICD-10-CM | POA: Diagnosis not present

## 2021-05-20 DIAGNOSIS — N135 Crossing vessel and stricture of ureter without hydronephrosis: Secondary | ICD-10-CM | POA: Diagnosis not present

## 2021-05-20 HISTORY — PX: IR NEPHROSTOMY EXCHANGE LEFT: IMG6069

## 2021-05-20 MED ORDER — LIDOCAINE HCL 1 % IJ SOLN
INTRAMUSCULAR | Status: AC
  Filled 2021-05-20: qty 20

## 2021-05-20 MED ORDER — IOHEXOL 300 MG/ML  SOLN
25.0000 mL | Freq: Once | INTRAMUSCULAR | Status: AC | PRN
  Administered 2021-05-20: 10 mL

## 2021-05-20 NOTE — Procedures (Signed)
Interventional Radiology Procedure Note  Procedure:   Routine exchange of left PCN.  New 80F drain.  Complications: None Recommendations:  - DC now - Routine care  Signed,  Dulcy Fanny. Earleen Newport, DO

## 2021-05-30 ENCOUNTER — Other Ambulatory Visit: Payer: Self-pay

## 2021-05-30 ENCOUNTER — Ambulatory Visit (INDEPENDENT_AMBULATORY_CARE_PROVIDER_SITE_OTHER): Payer: Medicare Other

## 2021-05-30 DIAGNOSIS — R339 Retention of urine, unspecified: Secondary | ICD-10-CM

## 2021-05-30 NOTE — Patient Instructions (Signed)
Foley Catheter Care and Patient Education  Perform catheter care every day.  You can do this while in the shower, but NOT while taking a tub bath.  You will need the following supplies: -mild soap, such as Dove -water -a clean washcloth (not one already used for bathing) or a 4x4 piece of gauze -1 Cath-Secure -night drainage bag -2 alcohol swabs  Was you hands thoroughly with soap and water Using mild soap and water, clan your genital area Men should retract the foreskin, if needed, and clean the area, including the penis Women should separate the labia, and clean the area from front to back  3. Clean your urinary opening, which is where the catheter enters your body. 4. Clean the catheter from where it enters your body and then down, away from your body.  Hold the catheter at the point it enters your body so that you don't put tension on it. 5. Rinse the area well and dry it gently.  Changing the drainage bag You will change your drainage bag twice a day -in the morning after you shower, change he night bag to the leg bag -at night before you go to bed, change the leg bag to the night bag  Wash your hands thoroughly with soap and water Empty the urine from the drainage bag into the toilet before you change it Pinch off the catheter with your fingers and disconnect the used bag Wipe the end of the catheter using an alcohol pad Wipe the connector on the bag using the second alcohol pad Connect the clean bag to the catheter and release your finger pinch Check all connections.  Straighten any kinks or twits in the tubing  Caring for the Leg bag -always wear the leg bag below your knee.  This will help it drain. -keep the leg bag secure with the velcro straps.  If the straps leave a mark on your leg they are to tight and should be loosened.  Leaving the straps too tight can decrease you circulation and lead to blood clots. -empty the leg bag through the spout at the bottom every  2-4 hours as needed.  Do not let the bag become completely full. -do not lie down for longer than 2 hours while you are wearing the leg bag.  Caring for the Night Bag -always keep the night bag below the level of your bladder . -to hang your night bag while you sleep, place a clean plastic bag inside of a wastebasket.  Hang the night bag on the inside of the wastebasket.  Cleaning the Drainage bag Wash you hands thoroughly with soap and water. Rinse the equipment with cool water.  Do not use hot water it can damage the plastic equipment. Was the equipment with a mild liquid detergent (ivory) and rinse with cool water. To decrease odor, fill the bag halfway with a mixture of 1 part vinegar and 3 parts water. Shake the bag and let it sit for 15 minutes. Rinse the bag with cool water and hang it up to dry.  Preventing infection -keep the drainage bag below the level of your bladder and off the floor at all times. -keep the catheter secured to your thigh to prevent it from moving. -do not lie on or block the flow of urine in the tubing. -shower daily to keep the catheter clean. -clean your hands before and after touching the catheter or bag. -the spout of the drainage bag should never touch the side of   the toilet or any emptying container.  Special Points -You may see some blood or urine around where the catheter enters your body, especially when walking or having a bowel movement.  This is normal, as long as there is urine draining into the drainage bag.  If you experience significant leakage around  catheter tube where it enters your urethra possibly associated with lower abdominal cramping you could be having a bladder spasm.  Please notify your doctor and we can prescribe you a medication to improve your symptoms. -drink 1-2 glasses of liquids every 2 hours while you're awake.  Call your doctor immediately if -your catheter comes out, do not try to replace it yourself -you have  temperature of 101F (38.8C) or higher -you have decrease in the amount of urine you are making -you have foul-smelling urine -you have bright red blood or large blood clots in your urine -you have abdominal pain and no urine in your catheter bag   

## 2021-05-30 NOTE — Progress Notes (Signed)
Suprapubic Cath Change  Patient is present today for a suprapubic catheter change due to urinary retention.  38ml of water was drained from the balloon, a 24FR foley cath was removed from the tract with out difficulty.  Site was cleaned and prepped in a sterile fashion with betadine.  A 24FR foley cath was replaced into the tract no complications were noted. Urine return was noted, 10 ml of sterile water was inflated into the balloon and a leg bag was attached for drainage.  Patient tolerated well. A night bag was given to patient and proper instruction was given on how to switch bags.    Performed by: Mylen Mangan LPN  Follow up: Keep next scheduled NV

## 2021-06-14 ENCOUNTER — Telehealth: Payer: Self-pay

## 2021-06-14 NOTE — Telephone Encounter (Signed)
Patient wife called this am concerned with patients nephrostomy tube.  Wife reports she has notice redness around site and some drainage as well. Reports patient c/o  increase tenderness. Denies and fevers.  Instructed wife to draw with marker around red site to make a border. Appt scheduled with Dr. [name] Tuesday to assess and can assess if redness has extended any further.  Wife voiced understanding.

## 2021-06-17 NOTE — Progress Notes (Signed)
History of Present Illness: Jordan Ward is here today for follow-up of several issues:  Neurogenic bladder.  He has an indwelling suprapubic tube.  He has an atrophic right kidney.  He has recurrent hydronephrosis/urolithiasis of the left kidney.  He now has a left percutaneous nephrostomy tube in.  He has minimal output through his suprapubic tube.  Recurrent urolithiasis, left kidney.  No stone episodes over the past 4 months.  In May, he had a 13 mm left lower pole stone and significant hydronephrosis.  He had a double-J stent and at that time instead of a nephrostomy tube, and had significant hydronephrosis.  He has not had follow-up imaging since that time.  Past Medical History:  Diagnosis Date   Anemia    Arthritis    Back pain, chronic    Bilateral carotid bruits    C. difficile colitis 09/2011   CAD (coronary artery disease)    Carotid artery stenosis    Cerebrovascular disease    Cerebrovascular disease 08/17/2018   Colon polyps    Complication of cystostomy catheter, initial encounter ([place]) 04/20/2020   Dyslipidemia    Dysphagia 10/07/2011   Encephalopathy    Gait disorder    HA (headache)    High grade dysplasia in colonic adenoma 09/2005   History of kidney stones    HTN (hypertension), malignant 10/06/2011   Hypernatremia    Hypokalemia    Hypothyroidism 10/08/2011   Insomnia    Junctional rhythm    Kidney stones    MS (multiple sclerosis) (HCC)    Neuromuscular disorder (HCC)    MS   OSA (obstructive sleep apnea)    Paroxysmal atrial tachycardia ([place])    Peripheral vascular disease ([place])    Pneumonia 4 yrs ago   Pulmonary fibrosis ([place]) 10/06/2011   Pulmonary nodule 10/08/2011   PVD (peripheral vascular disease) ([place])    Sacral ulcer (HCC)    Sleep apnea    cannot tolerate   Stroke ([place])    left sided weakness   Suprapubic catheter ([place])    TIA (transient ischemic attack)    Tremors of nervous system 10/08/2011   Urinary tract infection    Ventral hernia without  obstruction or gangrene     Large 8X9cm ventral hernia with loss of domain. CT reads report as diastasis recti with herniation or diastasis recti.  Dr. Constance Haw, Surgery, reviewed CT with radiology and there is herniation with only hernia sac or peritoneum over the bowel and large separation of the rectus muslce (i.e. diastasis recti aka loss of domain).  No surgical intervention recommended given size, age, and health.     Past Surgical History:  Procedure Laterality Date   APPENDECTOMY  09/2005   at time of left hemicolectomy   BACK SURGERY  1976/1979   lower   BILIARY DILATION N/A 03/03/2020   Procedure: BILIARY DILATION;  Surgeon: Rogene Houston, MD;  [facility];  Service: Endoscopy;  Laterality: N/A;   CATARACT EXTRACTION W/PHACO Right 03/08/2018   Procedure: CATARACT EXTRACTION PHACO AND INTRAOCULAR LENS PLACEMENT RIGHT EYE;  Surgeon: Tonny Branch, MD;  Location: [facility];  Service: Ophthalmology;  Laterality: Right;  CDE: 8.86   CATARACT EXTRACTION W/PHACO Left 04/05/2018   Procedure: CATARACT EXTRACTION PHACO AND INTRAOCULAR LENS PLACEMENT (IOC);  Surgeon: Tonny Branch, MD;  Location: [facility];  Service: Ophthalmology;  Laterality: Left;  CDE: 7.36   CENTRAL LINE INSERTION-TUNNELED Right 09/11/2020   Procedure: PLACEMENT OF TUNNELED CENTRAL LINE INTO JUGULAR VEIN;  Surgeon: Virl Cagey, MD;  Location: [facility];  Service: General;  Laterality: Right;   CHOLECYSTECTOMY     Dr. Tamala Julian   COLON SURGERY  09/2005   [place]: four tubular adenomas, large adenomatous polyp with HIGH GRADE dysplasia   COLONOSCOPY  11/2004   Dr. Sharol Roussel sessile polyp splenic flexure, 51mm sessile polyp desc colon, tubulovillous adenoma (bx not removed)   COLONOSCOPY  01/2005   poor prep, polyp could not be found   COLONOSCOPY  05/2005   with EMR, polypectomy Dr. Olegario Messier, bx showed high grade dysplasia, partially resected   COLONOSCOPY  09/2005   Dr. Arsenio Loader, [place] ink tattooing, four villous  colon polyp (3 had been missed on previous colonoscopies due to limitations of procedures   COLONOSCOPY  09/2006   normal TI, no polyps   COLONOSCOPY  10/2007   Dr. Imogene Burn distal mammillations, benign bx, normal TI, random bx neg for microscopic colitis   CYSTOSCOPY W/ URETERAL STENT PLACEMENT Left 09/05/2020   Procedure: CYSTOSCOPY WITH RETROGRADE PYELOGRAM/URETERAL STENT PLACEMENT;  Surgeon: Ardis Hughs, MD;  Location: [facility];  Service: Urology;  Laterality: Left;   CYSTOSCOPY WITH LITHOLAPAXY N/A 07/27/2018   Procedure: CYSTOSCOPY WITH LITHOLAPAXY VIA  SUPRAPUBIC TUBE;  Surgeon: Franchot Gallo, MD;  Location: [facility];  Service: Urology;  Laterality: N/A;   CYSTOSCOPY WITH RETROGRADE PYELOGRAM, URETEROSCOPY AND STENT PLACEMENT Left 06/09/2017   Procedure: CYSTOSCOPY WITH LEFT RETROGRADE PYELOGRAM, LEFT URETEROSCOPY, LEFT URETEROSCOPIC STONE EXTRACTION, LEFT URETERAL STENT PLACEMENT;  Surgeon: Franchot Gallo, MD;  Location: [facility];  Service: Urology;  Laterality: Left;   CYSTOSCOPY/URETEROSCOPY/HOLMIUM LASER/STENT PLACEMENT Left 05/22/2020   Procedure: CYSTOSCOPY/URETEROSCOPY/STENT PLACEMENT;  Surgeon: Franchot Gallo, MD;  [facility]: [facility];  Service: Urology;  Laterality: Left;   ERCP N/A 03/03/2020   Procedure: ENDOSCOPIC RETROGRADE CHOLANGIOPANCREATOGRAPHY (ERCP);  Surgeon: Rogene Houston, MD;  [facility];  Service: Endoscopy;  Laterality: N/A;   ERCP N/A 04/20/2020   Procedure: ENDOSCOPIC RETROGRADE CHOLANGIOPANCREATOGRAPHY (ERCP);  Surgeon: Rogene Houston, MD;  [facility];  Service: Endoscopy;  Laterality: N/A;  to be done at 7:30am in [place] N/A 04/20/2020   Procedure: STENT REMOVAL;  Surgeon: Rogene Houston, MD;  [facility];  Service: Endoscopy;  Laterality: N/A;   INGUINAL HERNIA REPAIR  1971   bilateral   INSERTION OF SUPRAPUBIC CATHETER  06/09/2017   Procedure: EXCHANGE OF SUPRAPUBIC CATHETER;   Surgeon: Franchot Gallo, MD;  Location: [facility];  Service: Urology;;   INSERTION OF SUPRAPUBIC CATHETER  05/22/2020   Procedure: SUPRAPUBIC CATHETER EXCHANGE;  Surgeon: Franchot Gallo, MD;  Location: [facility];  Service: Urology;;   IR NEPHROSTOMY EXCHANGE LEFT  03/25/2021   IR NEPHROSTOMY EXCHANGE LEFT  05/20/2021   IR NEPHROSTOMY PLACEMENT LEFT  05/26/2017   IR NEPHROSTOMY PLACEMENT LEFT  05/02/2020   IR NEPHROSTOMY PLACEMENT LEFT  02/15/2021   IR URETERAL STENT PERC REMOVAL MOD SED  03/25/2021   KIDNEY STONE SURGERY  09/13/2015   LITHOTRIPSY N/A 03/03/2020   Procedure: MECHANICAL LITHOTRIPSY WITH REMOVAL OF MULTIPLE STONE FRAGMENTS;  Surgeon: Rogene Houston, MD;  [facility];  Service: Endoscopy;  Laterality: N/A;   NEPHROLITHOTOMY Left 09/13/2015   Procedure: LEFT PERCUTANEOUS NEPHROLITHOTOMY ;  Surgeon: Franchot Gallo, MD;  Location: [facility];  Service: Urology;  Laterality: Left;   NEPHROSTOMY TUBE REMOVAL  05/22/2020   Procedure: NEPHROSTOMY TUBE REMOVAL;  Surgeon: Franchot Gallo, MD;  Location: [facility];  Service:  Urology;;   [facility] REMOVAL Right 09/20/2020   Procedure: MINOR REMOVAL CENTRAL LINE;  Surgeon: Virl Cagey, MD;  Location: [facility];  Service: General;  Laterality: Right;  Pt to arrive at 7:30am for procedure   REMOVAL OF STONES N/A 04/20/2020   Procedure: REMOVAL OF STONES;  Surgeon: Rogene Houston, MD;  [facility];  Service: Endoscopy;  Laterality: N/A;   SPHINCTEROTOMY  03/03/2020   Procedure: BILLARY SPHINCTEROTOMY;  Surgeon: Rogene Houston, MD;  [facility];  Service: Endoscopy;;   SUPRAPUBIC CATHETER INSERTION      Home Medications:  Allergies as of 06/18/2021       Reactions   Tetracyclines & Related Anaphylaxis, Rash   Ciprofloxacin    Trouble swallowing unknown reaction according to wife         Medication List        Accurate as of June 17, 2021  8:58 PM. If you have any questions, ask your nurse or  doctor.          amLODipine 5 MG tablet Commonly known as: NORVASC Take 1 tablet (5 mg total) by mouth daily.   amoxicillin 875 MG tablet Commonly known as: AMOXIL Take 1 tablet (875 mg total) by mouth every 12 (twelve) hours.   diazepam 5 MG tablet Commonly known as: VALIUM TAKE 1 TABLET EVERY 12 HOURS AS NEEDED FOR MUSCLE SPASMS   HYDROcodone-acetaminophen 7.5-325 MG tablet Commonly known as: NORCO Take 1 tablet by mouth 2 (two) times daily. Max APAP 3 GM IN 24 HOURS FROM ALL SOURCES What changed:  when to take this additional instructions   levothyroxine 88 MCG tablet Commonly known as: SYNTHROID Take 88 mcg by mouth daily.   OXYGEN Inhale 1.5 L into the lungs every evening.        Allergies:  Allergies  Allergen Reactions   Tetracyclines & Related Anaphylaxis and Rash   Ciprofloxacin     Trouble swallowing unknown reaction according to wife     Family History  Problem Relation Age of Onset   Cirrhosis Brother        etoh   Stroke Mother 51   Coronary artery disease Father 90   Heart attack Brother    Cancer Sister    Multiple sclerosis Other    Colon cancer Neg Hx     Social History:  reports that he quit smoking about 33 years ago. His smoking use included cigarettes. He has a 25.00 pack-year smoking history. He has never used smokeless tobacco. He reports that he does not drink alcohol and does not use drugs.  ROS: A complete review of systems was performed.  All systems are negative except for pertinent findings as noted.  Physical Exam:  Vital signs in last 24 hours: There were no vitals taken for this visit. Constitutional:  Alert and oriented, No acute distress Cardiovascular: Regular rate  Respiratory: Normal respiratory effort Percutaneous nephrostomy tube site is clean, minimally erythematous. Lymphatic: No lymphadenopathy Neurologic: Grossly intact, no focal deficits Psychiatric: Normal mood and affect  I have reviewed prior pt  notes  I have reviewed urinalysis results  I have independently reviewed prior imaging-CT scan from May, 2022    Impression/Assessment:  1.  History of urolithiasis, no active process noted.  Needs follow-up imaging  2.  Left hydronephrosis with nephrostomy tube in place, stent has been removed  3.  Neurogenic bladder, suprapubic tube draining an atrophic right kidney with minimal urinary output  Plan:  1.  Suprapubic tube was changed today  2.  I will arrange patient to have noncontrasted CT scan of abdomen and pelvis  3.  Continue monthly suprapubic tube changes, every 6 week nephrostomy tube changes

## 2021-06-18 ENCOUNTER — Ambulatory Visit (INDEPENDENT_AMBULATORY_CARE_PROVIDER_SITE_OTHER): Payer: Medicare Other | Admitting: Urology

## 2021-06-18 ENCOUNTER — Other Ambulatory Visit: Payer: Self-pay

## 2021-06-18 ENCOUNTER — Encounter: Payer: Self-pay | Admitting: Urology

## 2021-06-18 VITALS — BP 131/74 | HR 73 | Ht 66.0 in | Wt 112.0 lb

## 2021-06-18 DIAGNOSIS — R339 Retention of urine, unspecified: Secondary | ICD-10-CM | POA: Diagnosis not present

## 2021-06-18 DIAGNOSIS — N200 Calculus of kidney: Secondary | ICD-10-CM

## 2021-06-18 DIAGNOSIS — N1330 Unspecified hydronephrosis: Secondary | ICD-10-CM | POA: Diagnosis not present

## 2021-06-18 NOTE — Progress Notes (Signed)
Urological Symptom Review  Patient is experiencing the following symptoms: Get up at night to urinate   Review of Systems  Gastrointestinal (upper)  : Negative for upper GI symptoms  Gastrointestinal (lower) : Diarrhea Constipation  Constitutional : Negative for symptoms  Skin: Negative for skin symptoms  Eyes: Blurred vision  Ear/Nose/Throat : Negative for Ear/Nose/Throat symptoms  Hematologic/Lymphatic: Easy bruising  Cardiovascular : Negative for cardiovascular symptoms  Respiratory : Negative for respiratory symptoms  Endocrine: Negative for endocrine symptoms  Musculoskeletal: Negative for musculoskeletal symptoms  Neurological: Headaches  Psychologic: Negative for psychiatric symptoms

## 2021-06-18 NOTE — Progress Notes (Signed)
Suprapubic Cath Change  Patient is present today for a suprapubic catheter change due to urinary retention.  18ml of water was drained from the balloon, a 24FR foley cath was removed from the tract with out difficulty.  Site was cleaned and prepped in a sterile fashion with betadine.  A 24FR foley cath was replaced into the tract no complications were noted. Urine return was noted, 10 ml of sterile water was inflated into the balloon and a leg bag was attached for drainage.  Patient tolerated well. A night bag was given to patient and proper instruction was given on how to switch bags.    Performed by: Dory Verdun LPN  Follow up: 1 month cath change

## 2021-06-27 ENCOUNTER — Ambulatory Visit: Payer: Medicare Other

## 2021-06-27 DIAGNOSIS — R7301 Impaired fasting glucose: Secondary | ICD-10-CM | POA: Diagnosis not present

## 2021-06-27 DIAGNOSIS — I10 Essential (primary) hypertension: Secondary | ICD-10-CM | POA: Diagnosis not present

## 2021-06-27 DIAGNOSIS — E0590 Thyrotoxicosis, unspecified without thyrotoxic crisis or storm: Secondary | ICD-10-CM | POA: Diagnosis not present

## 2021-07-01 ENCOUNTER — Telehealth: Payer: Self-pay

## 2021-07-01 NOTE — Telephone Encounter (Signed)
Wife called stating patient has been running a low grade fever all weekend. He has had swelling in his feet since Saturday. He a afebrile today. Patient has appointment with his PCP in the morning. Wife informed to elevate patients feet and if he begins to have difficulty breathing to go to the ER.

## 2021-07-02 DIAGNOSIS — G894 Chronic pain syndrome: Secondary | ICD-10-CM | POA: Diagnosis not present

## 2021-07-02 DIAGNOSIS — R7303 Prediabetes: Secondary | ICD-10-CM | POA: Diagnosis not present

## 2021-07-02 DIAGNOSIS — N2889 Other specified disorders of kidney and ureter: Secondary | ICD-10-CM | POA: Diagnosis not present

## 2021-07-02 DIAGNOSIS — Z0001 Encounter for general adult medical examination with abnormal findings: Secondary | ICD-10-CM | POA: Diagnosis not present

## 2021-07-02 DIAGNOSIS — G35 Multiple sclerosis: Secondary | ICD-10-CM | POA: Diagnosis not present

## 2021-07-02 DIAGNOSIS — Z936 Other artificial openings of urinary tract status: Secondary | ICD-10-CM | POA: Diagnosis not present

## 2021-07-02 DIAGNOSIS — I10 Essential (primary) hypertension: Secondary | ICD-10-CM | POA: Diagnosis not present

## 2021-07-02 DIAGNOSIS — R600 Localized edema: Secondary | ICD-10-CM | POA: Diagnosis not present

## 2021-07-02 DIAGNOSIS — K8010 Calculus of gallbladder with chronic cholecystitis without obstruction: Secondary | ICD-10-CM | POA: Diagnosis not present

## 2021-07-02 DIAGNOSIS — R945 Abnormal results of liver function studies: Secondary | ICD-10-CM | POA: Diagnosis not present

## 2021-07-02 DIAGNOSIS — E039 Hypothyroidism, unspecified: Secondary | ICD-10-CM | POA: Diagnosis not present

## 2021-07-02 DIAGNOSIS — E876 Hypokalemia: Secondary | ICD-10-CM | POA: Diagnosis not present

## 2021-07-02 DIAGNOSIS — N1832 Chronic kidney disease, stage 3b: Secondary | ICD-10-CM | POA: Diagnosis not present

## 2021-07-03 ENCOUNTER — Other Ambulatory Visit: Payer: Self-pay

## 2021-07-03 ENCOUNTER — Ambulatory Visit (HOSPITAL_COMMUNITY)
Admission: RE | Admit: 2021-07-03 | Discharge: 2021-07-03 | Disposition: A | Discharge status: Home / Self Care | Payer: Medicare Other | Source: Ambulatory Visit | Attending: Urology | Admitting: Urology

## 2021-07-03 DIAGNOSIS — N200 Calculus of kidney: Secondary | ICD-10-CM | POA: Diagnosis not present

## 2021-07-03 DIAGNOSIS — Z466 Encounter for fitting and adjustment of urinary device: Secondary | ICD-10-CM | POA: Diagnosis not present

## 2021-07-03 DIAGNOSIS — N210 Calculus in bladder: Secondary | ICD-10-CM | POA: Diagnosis not present

## 2021-07-03 DIAGNOSIS — N201 Calculus of ureter: Secondary | ICD-10-CM | POA: Diagnosis not present

## 2021-07-06 ENCOUNTER — Other Ambulatory Visit: Payer: Self-pay | Admitting: Neurology

## 2021-07-11 NOTE — Progress Notes (Signed)
Results sent via my chart. Appt created.

## 2021-07-12 ENCOUNTER — Inpatient Hospital Stay (HOSPITAL_COMMUNITY)
Admission: EM | Admit: 2021-07-12 | Discharge: 2021-07-15 | DRG: 698 | Disposition: A | Discharge status: Home Health | Payer: Medicare Other | Attending: Family Medicine | Admitting: Family Medicine

## 2021-07-12 ENCOUNTER — Encounter (HOSPITAL_COMMUNITY): Payer: Self-pay | Admitting: *Deleted

## 2021-07-12 ENCOUNTER — Other Ambulatory Visit: Payer: Self-pay

## 2021-07-12 ENCOUNTER — Emergency Department (HOSPITAL_COMMUNITY): Payer: Medicare Other

## 2021-07-12 DIAGNOSIS — Y732 Prosthetic and other implants, materials and accessory gastroenterology and urology devices associated with adverse incidents: Secondary | ICD-10-CM | POA: Diagnosis present

## 2021-07-12 DIAGNOSIS — N202 Calculus of kidney with calculus of ureter: Secondary | ICD-10-CM | POA: Diagnosis present

## 2021-07-12 DIAGNOSIS — I2510 Atherosclerotic heart disease of native coronary artery without angina pectoris: Secondary | ICD-10-CM | POA: Diagnosis not present

## 2021-07-12 DIAGNOSIS — E876 Hypokalemia: Secondary | ICD-10-CM | POA: Diagnosis present

## 2021-07-12 DIAGNOSIS — G35 Multiple sclerosis: Secondary | ICD-10-CM | POA: Diagnosis not present

## 2021-07-12 DIAGNOSIS — Z9359 Other cystostomy status: Secondary | ICD-10-CM | POA: Diagnosis not present

## 2021-07-12 DIAGNOSIS — K439 Ventral hernia without obstruction or gangrene: Secondary | ICD-10-CM | POA: Diagnosis not present

## 2021-07-12 DIAGNOSIS — N1831 Chronic kidney disease, stage 3a: Secondary | ICD-10-CM | POA: Diagnosis not present

## 2021-07-12 DIAGNOSIS — Z86010 Personal history of colonic polyps: Secondary | ICD-10-CM

## 2021-07-12 DIAGNOSIS — N1330 Unspecified hydronephrosis: Secondary | ICD-10-CM | POA: Diagnosis present

## 2021-07-12 DIAGNOSIS — E039 Hypothyroidism, unspecified: Secondary | ICD-10-CM | POA: Diagnosis not present

## 2021-07-12 DIAGNOSIS — G8929 Other chronic pain: Secondary | ICD-10-CM | POA: Diagnosis present

## 2021-07-12 DIAGNOSIS — I420 Dilated cardiomyopathy: Secondary | ICD-10-CM | POA: Diagnosis present

## 2021-07-12 DIAGNOSIS — Z820 Family history of epilepsy and other diseases of the nervous system: Secondary | ICD-10-CM

## 2021-07-12 DIAGNOSIS — Z79890 Hormone replacement therapy (postmenopausal): Secondary | ICD-10-CM

## 2021-07-12 DIAGNOSIS — J449 Chronic obstructive pulmonary disease, unspecified: Secondary | ICD-10-CM | POA: Diagnosis present

## 2021-07-12 DIAGNOSIS — N261 Atrophy of kidney (terminal): Secondary | ICD-10-CM | POA: Diagnosis not present

## 2021-07-12 DIAGNOSIS — Z79899 Other long term (current) drug therapy: Secondary | ICD-10-CM

## 2021-07-12 DIAGNOSIS — Z466 Encounter for fitting and adjustment of urinary device: Secondary | ICD-10-CM | POA: Diagnosis not present

## 2021-07-12 DIAGNOSIS — Z9981 Dependence on supplemental oxygen: Secondary | ICD-10-CM

## 2021-07-12 DIAGNOSIS — L89611 Pressure ulcer of right heel, stage 1: Secondary | ICD-10-CM | POA: Diagnosis present

## 2021-07-12 DIAGNOSIS — Z8673 Personal history of transient ischemic attack (TIA), and cerebral infarction without residual deficits: Secondary | ICD-10-CM | POA: Diagnosis not present

## 2021-07-12 DIAGNOSIS — N390 Urinary tract infection, site not specified: Secondary | ICD-10-CM | POA: Diagnosis present

## 2021-07-12 DIAGNOSIS — Z882 Allergy status to sulfonamides status: Secondary | ICD-10-CM

## 2021-07-12 DIAGNOSIS — B952 Enterococcus as the cause of diseases classified elsewhere: Secondary | ICD-10-CM | POA: Diagnosis present

## 2021-07-12 DIAGNOSIS — A4150 Gram-negative sepsis, unspecified: Secondary | ICD-10-CM | POA: Diagnosis not present

## 2021-07-12 DIAGNOSIS — Y838 Other surgical procedures as the cause of abnormal reaction of the patient, or of later complication, without mention of misadventure at the time of the procedure: Secondary | ICD-10-CM | POA: Diagnosis not present

## 2021-07-12 DIAGNOSIS — Z8249 Family history of ischemic heart disease and other diseases of the circulatory system: Secondary | ICD-10-CM

## 2021-07-12 DIAGNOSIS — N99528 Other complication of other external stoma of urinary tract: Secondary | ICD-10-CM

## 2021-07-12 DIAGNOSIS — N183 Chronic kidney disease, stage 3 unspecified: Secondary | ICD-10-CM | POA: Diagnosis present

## 2021-07-12 DIAGNOSIS — Z87891 Personal history of nicotine dependence: Secondary | ICD-10-CM

## 2021-07-12 DIAGNOSIS — I700 Atherosclerosis of aorta: Secondary | ICD-10-CM | POA: Diagnosis not present

## 2021-07-12 DIAGNOSIS — Z823 Family history of stroke: Secondary | ICD-10-CM

## 2021-07-12 DIAGNOSIS — E785 Hyperlipidemia, unspecified: Secondary | ICD-10-CM | POA: Diagnosis present

## 2021-07-12 DIAGNOSIS — Z20822 Contact with and (suspected) exposure to covid-19: Secondary | ICD-10-CM | POA: Diagnosis present

## 2021-07-12 DIAGNOSIS — N179 Acute kidney failure, unspecified: Secondary | ICD-10-CM | POA: Diagnosis present

## 2021-07-12 DIAGNOSIS — R6521 Severe sepsis with septic shock: Secondary | ICD-10-CM | POA: Diagnosis not present

## 2021-07-12 DIAGNOSIS — N319 Neuromuscular dysfunction of bladder, unspecified: Secondary | ICD-10-CM | POA: Diagnosis present

## 2021-07-12 DIAGNOSIS — J8410 Pulmonary fibrosis, unspecified: Secondary | ICD-10-CM | POA: Diagnosis not present

## 2021-07-12 DIAGNOSIS — N1832 Chronic kidney disease, stage 3b: Secondary | ICD-10-CM | POA: Diagnosis present

## 2021-07-12 DIAGNOSIS — I5032 Chronic diastolic (congestive) heart failure: Secondary | ICD-10-CM | POA: Diagnosis not present

## 2021-07-12 DIAGNOSIS — L89151 Pressure ulcer of sacral region, stage 1: Secondary | ICD-10-CM | POA: Diagnosis not present

## 2021-07-12 DIAGNOSIS — L89609 Pressure ulcer of unspecified heel, unspecified stage: Secondary | ICD-10-CM | POA: Diagnosis present

## 2021-07-12 DIAGNOSIS — N99522 Malfunction of other external stoma of urinary tract: Secondary | ICD-10-CM | POA: Diagnosis present

## 2021-07-12 DIAGNOSIS — N139 Obstructive and reflux uropathy, unspecified: Secondary | ICD-10-CM | POA: Diagnosis not present

## 2021-07-12 DIAGNOSIS — T83512A Infection and inflammatory reaction due to nephrostomy catheter, initial encounter: Secondary | ICD-10-CM | POA: Diagnosis not present

## 2021-07-12 DIAGNOSIS — T83022A Displacement of nephrostomy catheter, initial encounter: Secondary | ICD-10-CM | POA: Diagnosis present

## 2021-07-12 DIAGNOSIS — N136 Pyonephrosis: Secondary | ICD-10-CM | POA: Diagnosis present

## 2021-07-12 DIAGNOSIS — I739 Peripheral vascular disease, unspecified: Secondary | ICD-10-CM | POA: Diagnosis present

## 2021-07-12 DIAGNOSIS — Z881 Allergy status to other antibiotic agents status: Secondary | ICD-10-CM

## 2021-07-12 DIAGNOSIS — L89321 Pressure ulcer of left buttock, stage 1: Secondary | ICD-10-CM | POA: Diagnosis present

## 2021-07-12 DIAGNOSIS — A419 Sepsis, unspecified organism: Secondary | ICD-10-CM | POA: Diagnosis not present

## 2021-07-12 DIAGNOSIS — I130 Hypertensive heart and chronic kidney disease with heart failure and stage 1 through stage 4 chronic kidney disease, or unspecified chronic kidney disease: Secondary | ICD-10-CM | POA: Diagnosis present

## 2021-07-12 DIAGNOSIS — L89621 Pressure ulcer of left heel, stage 1: Secondary | ICD-10-CM | POA: Diagnosis present

## 2021-07-12 DIAGNOSIS — J9611 Chronic respiratory failure with hypoxia: Secondary | ICD-10-CM | POA: Diagnosis not present

## 2021-07-12 DIAGNOSIS — M1990 Unspecified osteoarthritis, unspecified site: Secondary | ICD-10-CM | POA: Diagnosis present

## 2021-07-12 LAB — CBC WITH DIFFERENTIAL/PLATELET
Abs Immature Granulocytes: 0.07 10*3/uL (ref 0.00–0.07)
Basophils Absolute: 0.1 10*3/uL (ref 0.0–0.1)
Basophils Relative: 0 %
Eosinophils Absolute: 0 10*3/uL (ref 0.0–0.5)
Eosinophils Relative: 0 %
HCT: 38.9 % — ABNORMAL LOW (ref 39.0–52.0)
Hemoglobin: 13.1 g/dL (ref 13.0–17.0)
Immature Granulocytes: 0 %
Lymphocytes Relative: 7 %
Lymphs Abs: 1.4 10*3/uL (ref 0.7–4.0)
MCH: 30.8 pg (ref 26.0–34.0)
MCHC: 33.7 g/dL (ref 30.0–36.0)
MCV: 91.3 fL (ref 80.0–100.0)
Monocytes Absolute: 0.8 10*3/uL (ref 0.1–1.0)
Monocytes Relative: 4 %
Neutro Abs: 17 10*3/uL — ABNORMAL HIGH (ref 1.7–7.7)
Neutrophils Relative %: 89 %
Platelets: 203 10*3/uL (ref 150–400)
RBC: 4.26 MIL/uL (ref 4.22–5.81)
RDW: 13.9 % (ref 11.5–15.5)
WBC: 19.3 10*3/uL — ABNORMAL HIGH (ref 4.0–10.5)
nRBC: 0 % (ref 0.0–0.2)

## 2021-07-12 LAB — URINALYSIS, ROUTINE W REFLEX MICROSCOPIC
Bilirubin Urine: NEGATIVE
Glucose, UA: NEGATIVE mg/dL
Ketones, ur: NEGATIVE mg/dL
Nitrite: POSITIVE — AB
Protein, ur: 30 mg/dL — AB
Specific Gravity, Urine: 1.006 (ref 1.005–1.030)
pH: 8 (ref 5.0–8.0)

## 2021-07-12 LAB — COMPREHENSIVE METABOLIC PANEL
ALT: 12 U/L (ref 0–44)
AST: 20 U/L (ref 15–41)
Albumin: 4.1 g/dL (ref 3.5–5.0)
Alkaline Phosphatase: 135 U/L — ABNORMAL HIGH (ref 38–126)
Anion gap: 9 (ref 5–15)
BUN: 26 mg/dL — ABNORMAL HIGH (ref 8–23)
CO2: 28 mmol/L (ref 22–32)
Calcium: 9 mg/dL (ref 8.9–10.3)
Chloride: 101 mmol/L (ref 98–111)
Creatinine, Ser: 1.84 mg/dL — ABNORMAL HIGH (ref 0.61–1.24)
GFR, Estimated: 37 mL/min — ABNORMAL LOW (ref >60)
Glucose, Bld: 119 mg/dL — ABNORMAL HIGH (ref 70–99)
Potassium: 2.7 mmol/L — CL (ref 3.5–5.1)
Sodium: 138 mmol/L (ref 135–145)
Total Bilirubin: 0.7 mg/dL (ref 0.3–1.2)
Total Protein: 8 g/dL (ref 6.5–8.1)

## 2021-07-12 LAB — RESP PANEL BY RT-PCR (FLU A&B, COVID) ARPGX2
Influenza A by PCR: NEGATIVE
Influenza B by PCR: NEGATIVE
SARS Coronavirus 2 by RT PCR: NEGATIVE

## 2021-07-12 LAB — LACTIC ACID, PLASMA
Lactic Acid, Venous: 1.5 mmol/L (ref 0.5–1.9)
Lactic Acid, Venous: 1.6 mmol/L (ref 0.5–1.9)

## 2021-07-12 MED ORDER — PIPERACILLIN-TAZOBACTAM 3.375 G IVPB
3.3750 g | Freq: Three times a day (TID) | INTRAVENOUS | Status: DC
  Administered 2021-07-12: 3.375 g via INTRAVENOUS
  Filled 2021-07-12: qty 50

## 2021-07-12 MED ORDER — POTASSIUM CHLORIDE 20 MEQ PO PACK
40.0000 meq | PACK | Freq: Once | ORAL | Status: AC
  Administered 2021-07-12: 40 meq via ORAL
  Filled 2021-07-12: qty 2

## 2021-07-12 MED ORDER — VANCOMYCIN HCL 500 MG/100ML IV SOLN
500.0000 mg | INTRAVENOUS | Status: DC
  Filled 2021-07-12 (×2): qty 100

## 2021-07-12 MED ORDER — NOREPINEPHRINE 4 MG/250ML-% IV SOLN: 4.0000 ug/min | INTRAVENOUS | Status: DC

## 2021-07-12 MED ORDER — IBUPROFEN 400 MG PO TABS
600.0000 mg | ORAL_TABLET | Freq: Once | ORAL | Status: AC
  Administered 2021-07-12: 600 mg via ORAL
  Filled 2021-07-12: qty 2

## 2021-07-12 MED ORDER — SODIUM CHLORIDE 0.9 % IV BOLUS
1000.0000 mL | Freq: Once | INTRAVENOUS | Status: AC
  Administered 2021-07-12: 1000 mL via INTRAVENOUS

## 2021-07-12 MED ORDER — PIPERACILLIN-TAZOBACTAM 3.375 G IVPB 30 MIN
3.3750 g | Freq: Two times a day (BID) | INTRAVENOUS | Status: DC
  Administered 2021-07-12: 3.375 g via INTRAVENOUS
  Filled 2021-07-12: qty 50

## 2021-07-12 MED ORDER — VANCOMYCIN HCL 750 MG/150ML IV SOLN
750.0000 mg | Freq: Once | INTRAVENOUS | Status: AC
  Administered 2021-07-12: 750 mg via INTRAVENOUS
  Filled 2021-07-12: qty 150

## 2021-07-12 MED ORDER — MORPHINE SULFATE (PF) 2 MG/ML IV SOLN
1.0000 mg | INTRAVENOUS | Status: DC | PRN
  Administered 2021-07-13: 1 mg via INTRAVENOUS
  Filled 2021-07-12: qty 1

## 2021-07-12 MED ORDER — ACETAMINOPHEN 325 MG PO TABS
650.0000 mg | ORAL_TABLET | Freq: Once | ORAL | Status: AC
  Administered 2021-07-12: 650 mg via ORAL
  Filled 2021-07-12: qty 2

## 2021-07-12 MED ORDER — MORPHINE SULFATE (PF) 2 MG/ML IV SOLN
2.0000 mg | Freq: Once | INTRAVENOUS | Status: AC
  Administered 2021-07-12: 2 mg via INTRAVENOUS
  Filled 2021-07-12: qty 1

## 2021-07-12 MED ORDER — POTASSIUM CHLORIDE CRYS ER 20 MEQ PO TBCR
20.0000 meq | EXTENDED_RELEASE_TABLET | Freq: Once | ORAL | Status: AC
  Administered 2021-07-13: 20 meq via ORAL
  Filled 2021-07-12: qty 1

## 2021-07-12 MED ORDER — HEPARIN SODIUM (PORCINE) 5000 UNIT/ML IJ SOLN
5000.0000 [IU] | Freq: Three times a day (TID) | INTRAMUSCULAR | Status: DC
  Administered 2021-07-13 – 2021-07-15 (×6): 5000 [IU] via SUBCUTANEOUS
  Filled 2021-07-12 (×5): qty 1

## 2021-07-12 MED ORDER — SODIUM CHLORIDE 0.9 % IV SOLN: INTRAVENOUS | Status: DC

## 2021-07-12 MED ORDER — POTASSIUM CHLORIDE 10 MEQ/100ML IV SOLN
10.0000 meq | INTRAVENOUS | Status: AC
Start: 2021-07-12 — End: 2021-07-13
  Administered 2021-07-12: 10 meq via INTRAVENOUS
  Filled 2021-07-12: qty 100

## 2021-07-12 MED ORDER — SODIUM CHLORIDE 0.9 % IV BOLUS: 500.0000 mL | Freq: Once | INTRAVENOUS | Status: DC

## 2021-07-12 NOTE — ED Notes (Signed)
Morphine wated by this RN into [employer]. [facility] Present at bedside to verify waste.

## 2021-07-12 NOTE — H&P (Signed)
TRH H&P   Patient Demographics:    Jordan Ward, is a 80 y.o. male  MRN: [id number]   DOB - 10/23/1940  Admit Date - 07/12/2021  Outpatient Primary MD for the patient is Valentino Nose, FNP  Referring MD/NP/PA: Dr Almyra Free  Outpatient Specialists: Urology Dr [name]   Patient coming from: homr  Chief Complaint  Patient presents with   Fever      HPI:    Jordan Ward  is a 80 y.o. male, with extensive past medical history which includes multiple sclerosis, suprapubic catheter, dilated cardiomyopathy with diastolic CHF, pulmonary fibrosis, CKD 3, gram-negative bacteremia, coronary artery disease, stroke, recurrent urolithiasis status post stent initially, currently status post left nephrostomy since May/2022. -Patient presents to ED secondary to complaints of fever, left flank pain, generalized weakness, fatigue, and poor appetite, and reported drainage and left nephrostomy tube has been stopped, and reports symptoms over last 48 hours, with low output nephrostomy tube which prompted him to come to ED, patient denies any chest pain, his dyspnea at baseline, no diarrhea, suprapubic Foley is working fine. - in ED work-up significant for potassium 2.7, white blood cell count of 19.3, creatinine at baseline of 1.84, lactic acid within normal limit at 1.5, his CT abdomen pelvis significant for severe left hydronephrosis, with nephrostomy tube has backed out slightly, patient with positive UA, [facility] hospitalist consulted to admit.    Review of systems:    In addition to the HPI above,  Reports fever, chills, generalized weakness No Headache, No changes with Vision or hearing, No problems swallowing food or Liquids, No Chest pain, Cough or Shortness of Breath, No Abdominal pain, No Nausea or Vommitting, Bowel movements are regular, No Blood in stool or Urine, No dysuria, reports left  nephrostomy tube stopped functioning, reports left flank pain No new skin rashes or bruises, No new joints pains-aches,  No new weakness, tingling, numbness in any extremity, No recent weight gain or loss, No polyuria, polydypsia or polyphagia, No significant Mental Stressors.  A full 10 point Review of Systems was done, except as stated above, all other Review of Systems were negative.   With Past History of the following :    Past Medical History:  Diagnosis Date   Anemia    Arthritis    Back pain, chronic    Bilateral carotid bruits    C. difficile colitis 09/2011   CAD (coronary artery disease)    Carotid artery stenosis    Cerebrovascular disease    Cerebrovascular disease 08/17/2018   Colon polyps    Complication of cystostomy catheter, initial encounter ([place]) 04/20/2020   Dyslipidemia    Dysphagia 10/07/2011   Encephalopathy    Gait disorder    HA (headache)    High grade dysplasia in colonic adenoma 09/2005   History of kidney stones    HTN (hypertension), malignant 10/06/2011   Hypernatremia  Hypokalemia    Hypothyroidism 10/08/2011   Insomnia    Junctional rhythm    Kidney stones    MS (multiple sclerosis) (HCC)    Neuromuscular disorder (HCC)    MS   OSA (obstructive sleep apnea)    Paroxysmal atrial tachycardia (HCC)    Peripheral vascular disease (HCC)    Pneumonia 4 yrs ago   Pulmonary fibrosis ([place]) 10/06/2011   Pulmonary nodule 10/08/2011   PVD (peripheral vascular disease) ([place])    Sacral ulcer (HCC)    Sleep apnea    cannot tolerate   Stroke ([place])    left sided weakness   Suprapubic catheter ([place])    TIA (transient ischemic attack)    Tremors of nervous system 10/08/2011   Urinary tract infection    Ventral hernia without obstruction or gangrene     Large 8X9cm ventral hernia with loss of domain. CT reads report as diastasis recti with herniation or diastasis recti.  Dr. Constance Haw, Surgery, reviewed CT with radiology and there is herniation with only  hernia sac or peritoneum over the bowel and large separation of the rectus muslce (i.e. diastasis recti aka loss of domain).  No surgical intervention recommended given size, age, and health.       Past Surgical History:  Procedure Laterality Date   APPENDECTOMY  09/2005   at time of left hemicolectomy   BACK SURGERY  1976/1979   lower   BILIARY DILATION N/A 03/03/2020   Procedure: BILIARY DILATION;  Surgeon: Rogene Houston, MD;  [facility];  Service: Endoscopy;  Laterality: N/A;   CATARACT EXTRACTION W/PHACO Right 03/08/2018   Procedure: CATARACT EXTRACTION PHACO AND INTRAOCULAR LENS PLACEMENT RIGHT EYE;  Surgeon: Tonny Branch, MD;  Location: [facility];  Service: Ophthalmology;  Laterality: Right;  CDE: 8.86   CATARACT EXTRACTION W/PHACO Left 04/05/2018   Procedure: CATARACT EXTRACTION PHACO AND INTRAOCULAR LENS PLACEMENT (IOC);  Surgeon: Tonny Branch, MD;  Location: [facility];  Service: Ophthalmology;  Laterality: Left;  CDE: 7.36   CENTRAL LINE INSERTION-TUNNELED Right 09/11/2020   Procedure: PLACEMENT OF TUNNELED CENTRAL LINE INTO JUGULAR VEIN;  Surgeon: Virl Cagey, MD;  Location: [facility];  Service: General;  Laterality: Right;   CHOLECYSTECTOMY     Dr. Tamala Julian   COLON SURGERY  09/2005   [place]: four tubular adenomas, large adenomatous polyp with HIGH GRADE dysplasia   COLONOSCOPY  11/2004   Dr. Sharol Roussel sessile polyp splenic flexure, 71mm sessile polyp desc colon, tubulovillous adenoma (bx not removed)   COLONOSCOPY  01/2005   poor prep, polyp could not be found   COLONOSCOPY  05/2005   with EMR, polypectomy Dr. Olegario Messier, bx showed high grade dysplasia, partially resected   COLONOSCOPY  09/2005   Dr. Arsenio Loader, [place] ink tattooing, four villous colon polyp (3 had been missed on previous colonoscopies due to limitations of procedures   COLONOSCOPY  09/2006   normal TI, no polyps   COLONOSCOPY  10/2007   Dr. Imogene Burn distal mammillations, benign bx, normal TI,  random bx neg for microscopic colitis   CYSTOSCOPY W/ URETERAL STENT PLACEMENT Left 09/05/2020   Procedure: CYSTOSCOPY WITH RETROGRADE PYELOGRAM/URETERAL STENT PLACEMENT;  Surgeon: Ardis Hughs, MD;  Location: [facility];  Service: Urology;  Laterality: Left;   CYSTOSCOPY WITH LITHOLAPAXY N/A 07/27/2018   Procedure: CYSTOSCOPY WITH LITHOLAPAXY VIA  SUPRAPUBIC TUBE;  Surgeon: Franchot Gallo, MD;  Location: [facility];  Service: Urology;  Laterality: N/A;   CYSTOSCOPY WITH RETROGRADE PYELOGRAM, URETEROSCOPY AND STENT  PLACEMENT Left 06/09/2017   Procedure: CYSTOSCOPY WITH LEFT RETROGRADE PYELOGRAM, LEFT URETEROSCOPY, LEFT URETEROSCOPIC STONE EXTRACTION, LEFT URETERAL STENT PLACEMENT;  Surgeon: Franchot Gallo, MD;  Location: [facility];  Service: Urology;  Laterality: Left;   CYSTOSCOPY/URETEROSCOPY/HOLMIUM LASER/STENT PLACEMENT Left 05/22/2020   Procedure: CYSTOSCOPY/URETEROSCOPY/STENT PLACEMENT;  Surgeon: Franchot Gallo, MD;  Location: [facility];  Service: Urology;  Laterality: Left;   ERCP N/A 03/03/2020   Procedure: ENDOSCOPIC RETROGRADE CHOLANGIOPANCREATOGRAPHY (ERCP);  Surgeon: Rogene Houston, MD;  [facility];  Service: Endoscopy;  Laterality: N/A;   ERCP N/A 04/20/2020   Procedure: ENDOSCOPIC RETROGRADE CHOLANGIOPANCREATOGRAPHY (ERCP);  Surgeon: Rogene Houston, MD;  [facility];  Service: Endoscopy;  Laterality: N/A;  to be done at 7:30am in [place] N/A 04/20/2020   Procedure: STENT REMOVAL;  Surgeon: Rogene Houston, MD;  [facility];  Service: Endoscopy;  Laterality: N/A;   INGUINAL HERNIA REPAIR  1971   bilateral   INSERTION OF SUPRAPUBIC CATHETER  06/09/2017   Procedure: EXCHANGE OF SUPRAPUBIC CATHETER;  Surgeon: Franchot Gallo, MD;  Location: [facility];  Service: Urology;;   INSERTION OF SUPRAPUBIC CATHETER  05/22/2020   Procedure: SUPRAPUBIC CATHETER EXCHANGE;  Surgeon: Franchot Gallo, MD;  Location: [facility];  Service:  Urology;;   IR NEPHROSTOMY EXCHANGE LEFT  03/25/2021   IR NEPHROSTOMY EXCHANGE LEFT  05/20/2021   IR NEPHROSTOMY PLACEMENT LEFT  05/26/2017   IR NEPHROSTOMY PLACEMENT LEFT  05/02/2020   IR NEPHROSTOMY PLACEMENT LEFT  02/15/2021   IR URETERAL STENT PERC REMOVAL MOD SED  03/25/2021   KIDNEY STONE SURGERY  09/13/2015   LITHOTRIPSY N/A 03/03/2020   Procedure: MECHANICAL LITHOTRIPSY WITH REMOVAL OF MULTIPLE STONE FRAGMENTS;  Surgeon: Rogene Houston, MD;  [facility];  Service: Endoscopy;  Laterality: N/A;   NEPHROLITHOTOMY Left 09/13/2015   Procedure: LEFT PERCUTANEOUS NEPHROLITHOTOMY ;  Surgeon: Franchot Gallo, MD;  Location: [facility];  Service: Urology;  Laterality: Left;   NEPHROSTOMY TUBE REMOVAL  05/22/2020   Procedure: NEPHROSTOMY TUBE REMOVAL;  Surgeon: Franchot Gallo, MD;  Location: [facility];  Service: Urology;;   [facility] REMOVAL Right 09/20/2020   Procedure: MINOR REMOVAL CENTRAL LINE;  Surgeon: Virl Cagey, MD;  Location: [facility];  Service: General;  Laterality: Right;  Pt to arrive at 7:30am for procedure   REMOVAL OF STONES N/A 04/20/2020   Procedure: REMOVAL OF STONES;  Surgeon: Rogene Houston, MD;  [facility];  Service: Endoscopy;  Laterality: N/A;   SPHINCTEROTOMY  03/03/2020   Procedure: BILLARY SPHINCTEROTOMY;  Surgeon: Rogene Houston, MD;  [facility];  Service: Endoscopy;;   SUPRAPUBIC CATHETER INSERTION        Social History:     Social History   Tobacco Use   Smoking status: Former    Packs/day: 1.00    Years: 25.00    Pack years: 25.00    Types: Cigarettes    Quit date: 03/03/1988    Years since quitting: 33.3   Smokeless tobacco: Never  Substance Use Topics   Alcohol use: No       Family History :     Family History  Problem Relation Age of Onset   Cirrhosis Brother        etoh   Stroke Mother 70   Coronary artery disease Father 77   Heart attack Brother    Cancer Sister    Multiple sclerosis Other     Colon cancer  Neg Hx      Home Medications:   Prior to Admission medications   Medication Sig Start Date End Date Taking? Authorizing Provider  acetaminophen (TYLENOL) 325 MG tablet Take 650 mg by mouth every 6 (six) hours as needed.   Yes [provider]  amLODipine (NORVASC) 10 MG tablet Take 10 mg by mouth daily. 05/21/21  Yes [provider]  diazepam (VALIUM) 5 MG tablet TAKE 1 TABLET EVERY 12 HOURS AS NEEDED FOR MUSCLE SPASMS 07/08/21  Yes Kathrynn Ducking, MD  HYDROcodone-acetaminophen (NORCO) 7.5-325 MG per tablet Take 1 tablet by mouth 2 (two) times daily. Max APAP 3 GM IN 24 HOURS FROM ALL SOURCES Patient taking differently: Take 1 tablet by mouth in the morning and at bedtime. ALL MEDICATIONS TO BE CRUSHED AND PLACED IN APPLESAUCE 08/22/14  Yes Pandey, Mahima, MD  levothyroxine (SYNTHROID) 75 MCG tablet Take 75 mcg by mouth daily. 07/02/21  Yes [provider]  OXYGEN Inhale 1 L into the lungs every evening.   Yes [provider]  potassium chloride SA (KLOR-CON) 20 MEQ tablet Take 20 mEq by mouth daily. 07/02/21  Yes [provider]  cephALEXin (KEFLEX) 500 MG capsule Take 500 mg by mouth 2 (two) times daily. Patient not taking: No sig reported 07/02/21   [provider]  levothyroxine (SYNTHROID) 50 MCG tablet Take 50 mcg by mouth daily. Patient not taking: No sig reported 07/03/21   [provider]  levothyroxine (SYNTHROID) 88 MCG tablet Take 88 mcg by mouth daily. Patient not taking: No sig reported 12/31/20   [provider]  potassium chloride (KLOR-CON) 10 MEQ tablet Take 10 mEq by mouth daily. Patient not taking: No sig reported 07/01/21   [provider]     Allergies:     Allergies  Allergen Reactions   Tetracyclines & Related Anaphylaxis and Rash   Ciprofloxacin     Trouble swallowing unknown reaction according to wife      Physical Exam:   Vitals  Blood pressure 120/76, pulse 86,  temperature (!) 101 F (38.3 C), temperature source Oral, resp. rate 18, weight 50.8 kg, SpO2 95 %.   1. General frail, chronically appearing male, laying in bed in mild discomfort  2. Normal affect and insight, Not Suicidal or Homicidal, Awake Alert, Oriented X 3.  3. No F.N deficits, ALL C.Nerves Intact,   4. Ears and Eyes appear Normal, Conjunctivae clear, PERRLA.  Dry oral mucosa  5. Supple Neck, No JVD, No cervical lymphadenopathy appriciated, No Carotid Bruits.  6. Symmetrical Chest Appelbaum movement, Good air movement bilaterally, no wheezing  7. RRR, No Gallops, Rubs or Murmurs, No Parasternal Heave.  8. Positive Bowel Sounds, Abdomen Soft, prepubic Foley present, left nephrostomy tube present   9.  No Cyanosis, Normal Skin Turgor, No Skin Rash or Bruise.  10. Good muscle tone,  joints appear normal , no effusions, Normal ROM.  11. No Palpable Lymph Nodes in Neck or Axillae     Data Review:    CBC Recent Labs  Lab 07/12/21 1857  WBC 19.3*  HGB 13.1  HCT 38.9*  PLT 203  MCV 91.3  MCH 30.8  MCHC 33.7  RDW 13.9  LYMPHSABS 1.4  MONOABS 0.8  EOSABS 0.0  BASOSABS 0.1   ------------------------------------------------------------------------------------------------------------------  Chemistries  Recent Labs  Lab 07/12/21 1857  NA 138  K 2.7*  CL 101  CO2 28  GLUCOSE 119*  BUN 26*  CREATININE 1.84*  CALCIUM 9.0  AST  20  ALT 12  ALKPHOS 135*  BILITOT 0.7   ------------------------------------------------------------------------------------------------------------------ estimated creatinine clearance is 23.8 mL/min (A) (by C-G formula based on SCr of 1.84 mg/dL (H)). ------------------------------------------------------------------------------------------------------------------ No results for input(s): TSH, T4TOTAL, T3FREE, THYROIDAB in the last 72 hours.  Invalid input(s): FREET3  Coagulation profile No results for input(s): INR, PROTIME in the  last 168 hours. ------------------------------------------------------------------------------------------------------------------- No results for input(s): DDIMER in the last 72 hours. -------------------------------------------------------------------------------------------------------------------  Cardiac Enzymes No results for input(s): CKMB, TROPONINI, MYOGLOBIN in the last 168 hours.  Invalid input(s): CK ------------------------------------------------------------------------------------------------------------------    Component Value Date/Time   BNP 1,190.1 (H) 08/05/2015 0207     ---------------------------------------------------------------------------------------------------------------  Urinalysis    Component Value Date/Time   COLORURINE YELLOW 07/12/2021 2007   APPEARANCEUR CLOUDY (A) 07/12/2021 2007   LABSPEC 1.006 07/12/2021 2007   PHURINE 8.0 07/12/2021 2007   GLUCOSEU NEGATIVE 07/12/2021 2007   HGBUR MODERATE (A) 07/12/2021 2007   BILIRUBINUR NEGATIVE 07/12/2021 2007   [place] NEGATIVE 07/12/2021 2007   PROTEINUR 30 (A) 07/12/2021 2007   UROBILINOGEN 0.2 07/17/2014 1215   NITRITE POSITIVE (A) 07/12/2021 2007   LEUKOCYTESUR LARGE (A) 07/12/2021 2007    ----------------------------------------------------------------------------------------------------------------   Imaging Results:    CT Abdomen Pelvis Wo Contrast  Result Date: 07/12/2021 CLINICAL DATA:  Left nephrostomy tube with no output. EXAM: CT ABDOMEN AND PELVIS WITHOUT CONTRAST TECHNIQUE: Multidetector CT imaging of the abdomen and pelvis was performed following the standard protocol without IV contrast. COMPARISON:  07/03/2021 FINDINGS: LOWER CHEST: Bibasilar fibrotic appearance is unchanged HEPATOBILIARY: Normal hepatic contours. No intra- or extrahepatic biliary dilatation. There is unchanged pneumobilia. Gallbladder is surgically absent. PANCREAS: Normal pancreas. No ductal dilatation or  peripancreatic fluid collection. SPLEEN: Normal. ADRENALS/URINARY TRACT: The adrenal glands are normal. Severe right renal atrophy. There is severe left hydronephrosis. There is a nephrostomy tube with its coil in the renal pelvis, but the tube has backed out slightly. The proximal metallic marker now lies near the lateral edge of the renal parenchyma, where as it was previously at the border of the renal pelvis. There is a suprapubic catheter in the bladder. STOMACH/BOWEL: There is no hiatal hernia. Normal duodenal course and caliber. Ventral abdominal hernia contains multiple loops of nondilated small bowel no focal colonic abnormality. Normal appendix. VASCULAR/LYMPHATIC: There is calcific atherosclerosis of the abdominal aorta. No lymphadenopathy. REPRODUCTIVE: Normal prostate size with symmetric seminal vesicles. MUSCULOSKELETAL. No bony spinal canal stenosis or focal osseous abnormality. OTHER: None. IMPRESSION: 1. Severe left hydronephrosis. Nephrostomy tube has backed out slightly. 2. Ventral abdominal hernia contains multiple loops of nondilated small bowel. Aortic Atherosclerosis (ICD10-I70.0). Electronically Signed   By: Ulyses Jarred M.D.   On: 07/12/2021 20:15    My personal review of EKG: pending   Assessment & Plan:    Active Problems:   Chronic suprapubic catheter (HCC)   Chronic diastolic (congestive) heart failure (HCC)   CKD (chronic kidney disease), stage III (HCC)   Hydronephrosis of left kidney   Complicated UTI (urinary tract infection)   Acute unilateral obstructive uropathy   Hydronephrosis, left  Left hydronephrosis/pyelonephritis/obstructive uropathy due to dislodged nephrostomy tube -Significant leukocytosis, febrile of 101, lactic acid within normal limit, urine culture in the past growing Enterococcus, pansensitive, but giving how sick patient is we will continue with broad-spectrum antibiotic vancomycin and Zosyn for now, and will narrow antibiotic when he is more  stable. -Patient will be admitted to [facility], ED discussed with Dr. Alinda Money, I have discussed with Dr. Pascal Lux from IR,  they will be able to accommodate patient tomorrow, for nephrostomy tube exchange versus new tube(anyhow patient was due for tube exchange this coming Monday at [place] [facility]).  Patient should be kept n.p.o. if he needs new tube insertion. -Follow blood cultures and urine cultures  Hypokalemia -Repleted, recheck in a.m.  CKD stage IIIb -Baseline, continue to monitor  Neurogenic bladder, left urolithiasis -Patient with suprapubic Foley catheter, with known left ureterolithiasis with stent in the past, he has been with nephrostomy tube since May  Multiple sclerosis -Continue with Valium as needed  Chronic respiratory failure, -At baseline on 1 to 2 L nasal cannula  Hypothyroidism -Continue with Synthroid  COPD/pulmonary fibrosis -Stable, no wheezing, no rhonchi, continue with bronchodilators  Dilated cardiomyopathy -Euvolemic -last echo December 2021 with EF 50 to 24%, grade 1 diastolic dysfunction.   DVT Prophylaxis Heparin oral evening steroids after procedure AM Labs Ordered, also please review Full Orders  Family Communication: Admission, patients condition and plan of care including tests being ordered have been discussed with the patient and wife by phone who indicate understanding and agree with the plan and Code Status.  Code Status full  Likely DC to  home  Condition GUARDED    Consults called: urology consulted by ED,     Admission status: inpatient    Time spent in minutes : 65 minutes   Phillips Climes M.D on 07/12/2021 at 9:26 PM   [facility] - Office  [phone]

## 2021-07-12 NOTE — ED Triage Notes (Signed)
Patient has a drainage tube in left kidney and it stopped working this afternoon. 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

## 2021-07-12 NOTE — Progress Notes (Addendum)
Pharmacy Antibiotic Note  Jordan Ward is a 80 y.o. male admitted on 07/12/2021 with urosepsis. Patient has an indwelling suprapubic catheter, atrophic right kidney and recurrent hydronephrosis/urolithiasis of the left kidney with left percutaneous nephrostomy tube Per patient, tube stopped working.   Pharmacy has been consulted for piperacillin/tazobactam and vancomycin dosing.  ClCr ~23 ml/min. WBC 19, Tmax 100.6. Patient was 112 lbs on 06/18/21, updated weight pending.   10/14 Vancomycin 500mg  Q 24 hr Scr used: 1.84 mg/dL Weight: 51 kg Vd coeff: 0.72 L/kg Est AUC: 466   Plan: Piperacillin/tazobactam 3.375g q12 hr- running over 30 min due to only 1 PIV  Vancomycin 750mg  x1 load then 500mg  q24hr  Monitor cultures, clinical status, renal fx, vanc level Narrow abx as able and f/u duration    Temp (24hrs), Avg:99.7 F (37.6 C), Min:98.2 F (36.8 C), Max:100.6 F (38.1 C)  No results for input(s): WBC, CREATININE, LATICACIDVEN, VANCOTROUGH, VANCOPEAK, VANCORANDOM, GENTTROUGH, GENTPEAK, GENTRANDOM, TOBRATROUGH, TOBRAPEAK, TOBRARND, AMIKACINPEAK, AMIKACINTROU, AMIKACIN in the last 168 hours.  CrCl cannot be calculated (Patient's most recent lab result is older than the maximum 21 days allowed.).    Allergies  Allergen Reactions   Tetracyclines & Related Anaphylaxis and Rash   Ciprofloxacin     Trouble swallowing unknown reaction according to wife     Antimicrobials this admission: Pip/tazo 10/14 >>   Vanc 10/14>>  Microbiology results: 10/14 Bcx pending 10/14 Ucx pending   Thank you for allowing pharmacy to be a part of this patient's care.   Benetta Spar, PharmD, BCPS, BCCP Clinical Pharmacist  Please check AMION for all [place] phone numbers After 10:00 PM, call [place] [phone]

## 2021-07-12 NOTE — ED Notes (Signed)
2MG  Morphine Held after scanning it. Pts BP hypotensive.

## 2021-07-12 NOTE — ED Triage Notes (Signed)
States his kidney stopped working, has a fever and drainage

## 2021-07-13 ENCOUNTER — Inpatient Hospital Stay (HOSPITAL_COMMUNITY): Payer: Medicare Other

## 2021-07-13 DIAGNOSIS — N390 Urinary tract infection, site not specified: Secondary | ICD-10-CM

## 2021-07-13 DIAGNOSIS — A419 Sepsis, unspecified organism: Secondary | ICD-10-CM

## 2021-07-13 DIAGNOSIS — I5032 Chronic diastolic (congestive) heart failure: Secondary | ICD-10-CM

## 2021-07-13 DIAGNOSIS — N1832 Chronic kidney disease, stage 3b: Secondary | ICD-10-CM | POA: Diagnosis not present

## 2021-07-13 DIAGNOSIS — N1831 Chronic kidney disease, stage 3a: Secondary | ICD-10-CM | POA: Diagnosis not present

## 2021-07-13 DIAGNOSIS — R6521 Severe sepsis with septic shock: Secondary | ICD-10-CM

## 2021-07-13 DIAGNOSIS — Z9359 Other cystostomy status: Secondary | ICD-10-CM | POA: Diagnosis not present

## 2021-07-13 DIAGNOSIS — N139 Obstructive and reflux uropathy, unspecified: Secondary | ICD-10-CM

## 2021-07-13 HISTORY — PX: IR NEPHROSTOMY EXCHANGE LEFT: IMG6069

## 2021-07-13 LAB — BASIC METABOLIC PANEL
Anion gap: 10 (ref 5–15)
Anion gap: 8 (ref 5–15)
BUN: 25 mg/dL — ABNORMAL HIGH (ref 8–23)
BUN: 20 mg/dL (ref 8–23)
CO2: 25 mmol/L (ref 22–32)
CO2: 26 mmol/L (ref 22–32)
Calcium: 8.6 mg/dL — ABNORMAL LOW (ref 8.9–10.3)
Calcium: 8.6 mg/dL — ABNORMAL LOW (ref 8.9–10.3)
Chloride: 107 mmol/L (ref 98–111)
Chloride: 107 mmol/L (ref 98–111)
Creatinine, Ser: 1.78 mg/dL — ABNORMAL HIGH (ref 0.61–1.24)
Creatinine, Ser: 1.49 mg/dL — ABNORMAL HIGH (ref 0.61–1.24)
GFR, Estimated: 39 mL/min — ABNORMAL LOW (ref >60)
GFR, Estimated: 48 mL/min — ABNORMAL LOW (ref >60)
Glucose, Bld: 133 mg/dL — ABNORMAL HIGH (ref 70–99)
Glucose, Bld: 127 mg/dL — ABNORMAL HIGH (ref 70–99)
Potassium: 3 mmol/L — ABNORMAL LOW (ref 3.5–5.1)
Potassium: 3.6 mmol/L (ref 3.5–5.1)
Sodium: 142 mmol/L (ref 135–145)
Sodium: 141 mmol/L (ref 135–145)

## 2021-07-13 LAB — PHOSPHORUS: Phosphorus: 2.6 mg/dL (ref 2.5–4.6)

## 2021-07-13 LAB — GLUCOSE, CAPILLARY: Glucose-Capillary: 209 mg/dL — ABNORMAL HIGH (ref 70–99)

## 2021-07-13 LAB — CBC
HCT: 35.6 % — ABNORMAL LOW (ref 39.0–52.0)
Hemoglobin: 11.8 g/dL — ABNORMAL LOW (ref 13.0–17.0)
MCH: 29.9 pg (ref 26.0–34.0)
MCHC: 33.1 g/dL (ref 30.0–36.0)
MCV: 90.4 fL (ref 80.0–100.0)
Platelets: 195 10*3/uL (ref 150–400)
RBC: 3.94 MIL/uL — ABNORMAL LOW (ref 4.22–5.81)
RDW: 14 % (ref 11.5–15.5)
WBC: 25.3 10*3/uL — ABNORMAL HIGH (ref 4.0–10.5)
nRBC: 0 % (ref 0.0–0.2)

## 2021-07-13 LAB — MRSA NEXT GEN BY PCR, NASAL: MRSA by PCR Next Gen: NOT DETECTED

## 2021-07-13 LAB — MAGNESIUM: Magnesium: 2 mg/dL (ref 1.7–2.4)

## 2021-07-13 MED ORDER — CHLORHEXIDINE GLUCONATE CLOTH 2 % EX PADS
6.0000 | MEDICATED_PAD | Freq: Every day | CUTANEOUS | Status: DC
  Administered 2021-07-13 – 2021-07-14 (×2): 6 via TOPICAL

## 2021-07-13 MED ORDER — LACTATED RINGERS IV BOLUS
1000.0000 mL | Freq: Once | INTRAVENOUS | Status: AC
  Administered 2021-07-13: 1000 mL via INTRAVENOUS

## 2021-07-13 MED ORDER — DIAZEPAM 2 MG PO TABS
2.0000 mg | ORAL_TABLET | Freq: Two times a day (BID) | ORAL | Status: DC | PRN
  Administered 2021-07-13: 2 mg via ORAL
  Filled 2021-07-13: qty 1

## 2021-07-13 MED ORDER — NOREPINEPHRINE 4 MG/250ML-% IV SOLN: 2.0000 ug/min | INTRAVENOUS | Status: DC

## 2021-07-13 MED ORDER — POTASSIUM CHLORIDE 10 MEQ/100ML IV SOLN
10.0000 meq | INTRAVENOUS | Status: AC
Start: 2021-07-13 — End: 2021-07-13
  Administered 2021-07-13 (×2): 10 meq via INTRAVENOUS
  Filled 2021-07-13 (×2): qty 100

## 2021-07-13 MED ORDER — LEVOTHYROXINE SODIUM 75 MCG PO TABS
75.0000 ug | ORAL_TABLET | Freq: Every day | ORAL | Status: DC
  Administered 2021-07-13 – 2021-07-15 (×3): 75 ug via ORAL
  Filled 2021-07-13 (×3): qty 1

## 2021-07-13 MED ORDER — HYDROCODONE-ACETAMINOPHEN 7.5-325 MG PO TABS
1.0000 | ORAL_TABLET | Freq: Four times a day (QID) | ORAL | Status: DC | PRN
  Administered 2021-07-13 – 2021-07-14 (×2): 1 via ORAL
  Filled 2021-07-13 (×2): qty 1

## 2021-07-13 MED ORDER — POTASSIUM CHLORIDE 10 MEQ/100ML IV SOLN
10.0000 meq | INTRAVENOUS | Status: AC
Start: 2021-07-13 — End: 2021-07-13
  Administered 2021-07-13 (×3): 10 meq via INTRAVENOUS
  Filled 2021-07-13 (×3): qty 100

## 2021-07-13 MED ORDER — SODIUM CHLORIDE 0.9% FLUSH
10.0000 mL | Freq: Three times a day (TID) | INTRAVENOUS | Status: DC
  Administered 2021-07-13 – 2021-07-14 (×4): 10 mL
  Administered 2021-07-15: 5 mL

## 2021-07-13 MED ORDER — POTASSIUM CHLORIDE CRYS ER 20 MEQ PO TBCR
20.0000 meq | EXTENDED_RELEASE_TABLET | Freq: Every day | ORAL | Status: DC
  Administered 2021-07-13 – 2021-07-15 (×3): 20 meq via ORAL
  Filled 2021-07-13 (×3): qty 1

## 2021-07-13 MED ORDER — IOHEXOL 300 MG/ML  SOLN
25.0000 mL | Freq: Once | INTRAMUSCULAR | Status: AC | PRN
  Administered 2021-07-13: 10 mL

## 2021-07-13 MED ORDER — LIDOCAINE HCL 1 % IJ SOLN
INTRAMUSCULAR | Status: AC
  Administered 2021-07-13: 2 mL via SUBCUTANEOUS
  Filled 2021-07-13: qty 20

## 2021-07-13 MED ORDER — SODIUM CHLORIDE 0.9 % IV SOLN: 250.0000 mL | INTRAVENOUS | Status: DC

## 2021-07-13 MED ORDER — LACTATED RINGERS IV BOLUS
1000.0000 mL | Freq: Once | INTRAVENOUS | Status: AC
  Administered 2021-07-13: 500 mL via INTRAVENOUS

## 2021-07-13 MED ORDER — POTASSIUM CHLORIDE 10 MEQ/100ML IV SOLN: 10.0000 meq | INTRAVENOUS | Status: DC

## 2021-07-13 MED ORDER — PIPERACILLIN-TAZOBACTAM 3.375 G IVPB
3.3750 g | Freq: Three times a day (TID) | INTRAVENOUS | Status: DC
  Administered 2021-07-13 – 2021-07-15 (×7): 3.375 g via INTRAVENOUS
  Filled 2021-07-13 (×6): qty 50

## 2021-07-13 NOTE — Progress Notes (Signed)
eLink Physician-Brief Progress Note Patient Name: Jordan Ward DOB: April 10, 1941 MRN: [id number]   Date of Service  07/13/2021  HPI/Events of Note  Patient admitted with obstructive uropathy (acute exacerbation of a chronic problem), sepsis of urinary tract origin (due to UTI), he has multiple other co-morbidities. He is scheduled for IR re-positioning of his dislodged ureterostomy tube this a.m.  eICU Interventions  New Patient Evaluation.        Kerry Kass Jordan Ward 07/13/2021, 1:39 AM

## 2021-07-13 NOTE — Consult Note (Signed)
NAME:  Jordan Ward, MRN:  [id number], DOB:  1941/07/01, LOS: 1 ADMISSION DATE:  07/12/2021, CONSULTATION DATE: 07/14/2019 REFERRING MD:  Albertine Patricia, MD , CHIEF COMPLAINT: Left flank pain and fever  History of Present Illness:  80 year old male with multiple sclerosis, diastolic CHF, CKD stage III, coronary artery disease, urolithiasis s/p left nephrostomy since 01/2021 who presented to the ED with complaint of fever, left flank pain and generalized weakness. Per patient his symptoms started 2 days ago and he noticed no drainage from the left nephrostomy tube which prompted him to come to the emergency department and get evaluated.  Patient denies chest pain, palpitation, headache, weakness, nausea, vomiting or other complaints In the emergency department CT abdomen pelvis was done which shows severe left hydronephrosis with displacement of nephrostomy tube  Pertinent  Medical History   Past Medical History:  Diagnosis Date   Anemia    Arthritis    Back pain, chronic    Bilateral carotid bruits    C. difficile colitis 09/2011   CAD (coronary artery disease)    Carotid artery stenosis    Cerebrovascular disease    Cerebrovascular disease 08/17/2018   Colon polyps    Complication of cystostomy catheter, initial encounter ([place]) 04/20/2020   Dyslipidemia    Dysphagia 10/07/2011   Encephalopathy    Gait disorder    HA (headache)    High grade dysplasia in colonic adenoma 09/2005   History of kidney stones    HTN (hypertension), malignant 10/06/2011   Hypernatremia    Hypokalemia    Hypothyroidism 10/08/2011   Insomnia    Junctional rhythm    Kidney stones    MS (multiple sclerosis) (HCC)    Neuromuscular disorder (HCC)    MS   OSA (obstructive sleep apnea)    Paroxysmal atrial tachycardia ([place])    Peripheral vascular disease ([place])    Pneumonia 4 yrs ago   Pulmonary fibrosis ([place]) 10/06/2011   Pulmonary nodule 10/08/2011   PVD (peripheral vascular disease) (HCC)    Sacral ulcer  (HCC)    Sleep apnea    cannot tolerate   Stroke ([place])    left sided weakness   Suprapubic catheter ([place])    TIA (transient ischemic attack)    Tremors of nervous system 10/08/2011   Urinary tract infection    Ventral hernia without obstruction or gangrene     Large 8X9cm ventral hernia with loss of domain. CT reads report as diastasis recti with herniation or diastasis recti.  Dr. Constance Haw, Surgery, reviewed CT with radiology and there is herniation with only hernia sac or peritoneum over the bowel and large separation of the rectus muslce (i.e. diastasis recti aka loss of domain).  No surgical intervention recommended given size, age, and health.      Significant Hospital Events: Including procedures, antibiotic start and stop dates in addition to other pertinent events     Interim History / Subjective:  Patient was transferred from [facility] for IR evaluation  Objective   Blood pressure (!) 104/46, pulse (!) 49, temperature 98 F (36.7 C), temperature source Oral, resp. rate 17, weight 50.8 kg, SpO2 99 %.        Intake/Output Summary (Last 24 hours) at 07/13/2021 0511 Last data filed at 07/12/2021 2256 Gross per 24 hour  Intake 1900 ml  Output 500 ml  Net 1400 ml   Filed Weights   07/12/21 2123  Weight: 50.8 kg    Examination:   Physical exam:  General: Crtitically ill-appearing elderly Caucasian male, lying on the bed HEENT: [place]/AT, eyes anicteric.  Dry mucous membranes Neuro: Alert, awake, following commands, moving all 4 extremities Chest: Coarse breath sounds, no wheezes or rhonchi Heart: Regular rate and rhythm, no murmurs or gallops Abdomen: Soft, nontender, nondistended, bowel sounds present Skin: No rash   Resolved Hospital Problem list     Assessment & Plan:  Septic shock due to complicated UTI Left-sided hydronephrosis Displaced nephrostomy tube on left Obstructive uropathy Chronic HFpEF Hypokalemia/hypomagnesemia Chronic hypoxic  respiratory failure on home oxygen COPD/pulmonary fibrosis, not in exacerbation CKD stage IIIb  UA is consistent with acute urinary tract infection Imaging showed left-sided hydronephrosis with displacement on nephrostomy tube Continue aggressive IV fluid resuscitation Trend lactate Continue broad-spectrum antibiotics Continue IV vasopressor with map goal 65 Follow-up culture IR is consulted for replacement of nephrostomy tubes which will be done early morning Continue aggressively supplement electrolytes Continue oxygen Monitor serum creatinine Monitor intake and output   Best Practice (right click and "Reselect all SmartList Selections" daily)   Diet/type: NPO DVT prophylaxis: SCD GI prophylaxis: N/A Lines: N/A Foley:  N/A Code Status:  full code Last date of multidisciplinary goals of care discussion [pending]  Labs   CBC: Recent Labs  Lab 07/12/21 1857 07/13/21 0242  WBC 19.3* 25.3*  NEUTROABS 17.0*  --   HGB 13.1 11.8*  HCT 38.9* 35.6*  MCV 91.3 90.4  PLT 203 161    Basic Metabolic Panel: Recent Labs  Lab 07/12/21 1857 07/13/21 0242  NA 138 142  K 2.7* 3.0*  CL 101 107  CO2 28 25  GLUCOSE 119* 133*  BUN 26* 25*  CREATININE 1.84* 1.78*  CALCIUM 9.0 8.6*  MG  --  2.0  PHOS  --  2.6   GFR: Estimated Creatinine Clearance: 24.6 mL/min (A) (by C-G formula based on SCr of 1.78 mg/dL (H)). Recent Labs  Lab 07/12/21 1857 07/12/21 1858 07/12/21 2131 07/13/21 0242  WBC 19.3*  --   --  25.3*  LATICACIDVEN  --  1.5 1.6  --     Liver Function Tests: Recent Labs  Lab 07/12/21 1857  AST 20  ALT 12  ALKPHOS 135*  BILITOT 0.7  PROT 8.0  ALBUMIN 4.1   No results for input(s): LIPASE, AMYLASE in the last 168 hours. No results for input(s): AMMONIA in the last 168 hours.  ABG    Component Value Date/Time   PHART 7.345 (L) 07/18/2014 0308   PCO2ART 27.8 (L) 07/18/2014 0308   PO2ART 87.5 07/18/2014 0308   HCO3 14.8 (L) 07/18/2014 0308   TCO2  15.6 07/18/2014 0308   ACIDBASEDEF 9.8 (H) 07/18/2014 0308   O2SAT 95.8 07/18/2014 0308     Coagulation Profile: No results for input(s): INR, PROTIME in the last 168 hours.  Cardiac Enzymes: No results for input(s): CKTOTAL, CKMB, CKMBINDEX, TROPONINI in the last 168 hours.  HbA1C: Hgb A1c MFr Bld  Date/Time Value Ref Range Status  02/14/2021 06:41 AM 5.9 (H) 4.8 - 5.6 % Final    Comment:    (NOTE) Pre diabetes:          5.7%-6.4%  Diabetes:              >6.4%  Glycemic control for   <7.0% adults with diabetes   07/04/2020 04:28 AM 6.3 (H) 4.8 - 5.6 % Final    Comment:    (NOTE) Pre diabetes:          5.7%-6.4%  Diabetes:              >  6.4%  Glycemic control for   <7.0% adults with diabetes     CBG: Recent Labs  Lab 07/13/21 Benjamin*    Review of Systems:   12 point review of systems significant for complaint mentioned in HPI, rest is negative  Past Medical History:  He,  has a past medical history of Anemia, Arthritis, Back pain, chronic, Bilateral carotid bruits, C. difficile colitis (09/2011), CAD (coronary artery disease), Carotid artery stenosis, Cerebrovascular disease, Cerebrovascular disease (08/17/2018), Colon polyps, Complication of cystostomy catheter, initial encounter ([place]) (04/20/2020), Dyslipidemia, Dysphagia (10/07/2011), Encephalopathy, Gait disorder, HA (headache), High grade dysplasia in colonic adenoma (09/2005), History of kidney stones, HTN (hypertension), malignant (10/06/2011), Hypernatremia, Hypokalemia, Hypothyroidism (10/08/2011), Insomnia, Junctional rhythm, Kidney stones, MS (multiple sclerosis) (HCC), Neuromuscular disorder ([place]), OSA (obstructive sleep apnea), Paroxysmal atrial tachycardia ([place]), Peripheral vascular disease ([place]), Pneumonia (4 yrs ago), Pulmonary fibrosis ([place]) (10/06/2011), Pulmonary nodule (10/08/2011), PVD (peripheral vascular disease) ([place]), Sacral ulcer ([place]), Sleep apnea, Stroke [facility]), Suprapubic catheter ([place]), TIA  (transient ischemic attack), Tremors of nervous system (10/08/2011), Urinary tract infection, and Ventral hernia without obstruction or gangrene.   Surgical History:   Past Surgical History:  Procedure Laterality Date   APPENDECTOMY  09/2005   at time of left hemicolectomy   BACK SURGERY  1976/1979   lower   BILIARY DILATION N/A 03/03/2020   Procedure: BILIARY DILATION;  Surgeon: Rogene Houston, MD;  [facility];  Service: Endoscopy;  Laterality: N/A;   CATARACT EXTRACTION W/PHACO Right 03/08/2018   Procedure: CATARACT EXTRACTION PHACO AND INTRAOCULAR LENS PLACEMENT RIGHT EYE;  Surgeon: Tonny Branch, MD;  Location: [facility];  Service: Ophthalmology;  Laterality: Right;  CDE: 8.86   CATARACT EXTRACTION W/PHACO Left 04/05/2018   Procedure: CATARACT EXTRACTION PHACO AND INTRAOCULAR LENS PLACEMENT (IOC);  Surgeon: Tonny Branch, MD;  Location: [facility];  Service: Ophthalmology;  Laterality: Left;  CDE: 7.36   CENTRAL LINE INSERTION-TUNNELED Right 09/11/2020   Procedure: PLACEMENT OF TUNNELED CENTRAL LINE INTO JUGULAR VEIN;  Surgeon: Virl Cagey, MD;  Location: [facility];  Service: General;  Laterality: Right;   CHOLECYSTECTOMY     Dr. Tamala Julian   COLON SURGERY  09/2005   [place]: four tubular adenomas, large adenomatous polyp with HIGH GRADE dysplasia   COLONOSCOPY  11/2004   Dr. Sharol Roussel sessile polyp splenic flexure, 45mm sessile polyp desc colon, tubulovillous adenoma (bx not removed)   COLONOSCOPY  01/2005   poor prep, polyp could not be found   COLONOSCOPY  05/2005   with EMR, polypectomy Dr. Olegario Messier, bx showed high grade dysplasia, partially resected   COLONOSCOPY  09/2005   Dr. Arsenio Loader, [place] ink tattooing, four villous colon polyp (3 had been missed on previous colonoscopies due to limitations of procedures   COLONOSCOPY  09/2006   normal TI, no polyps   COLONOSCOPY  10/2007   Dr. Imogene Burn distal mammillations, benign bx, normal TI, random bx neg for microscopic  colitis   CYSTOSCOPY W/ URETERAL STENT PLACEMENT Left 09/05/2020   Procedure: CYSTOSCOPY WITH RETROGRADE PYELOGRAM/URETERAL STENT PLACEMENT;  Surgeon: Ardis Hughs, MD;  Location: [facility];  Service: Urology;  Laterality: Left;   CYSTOSCOPY WITH LITHOLAPAXY N/A 07/27/2018   Procedure: CYSTOSCOPY WITH LITHOLAPAXY VIA  SUPRAPUBIC TUBE;  Surgeon: Franchot Gallo, MD;  Location: [facility];  Service: Urology;  Laterality: N/A;   CYSTOSCOPY WITH RETROGRADE PYELOGRAM, URETEROSCOPY AND STENT PLACEMENT Left 06/09/2017   Procedure: CYSTOSCOPY WITH LEFT RETROGRADE PYELOGRAM, LEFT URETEROSCOPY, LEFT URETEROSCOPIC STONE EXTRACTION, LEFT URETERAL  STENT PLACEMENT;  Surgeon: Franchot Gallo, MD;  Location: [facility];  Service: Urology;  Laterality: Left;   CYSTOSCOPY/URETEROSCOPY/HOLMIUM LASER/STENT PLACEMENT Left 05/22/2020   Procedure: CYSTOSCOPY/URETEROSCOPY/STENT PLACEMENT;  Surgeon: Franchot Gallo, MD;  Location: [facility];  Service: Urology;  Laterality: Left;   ERCP N/A 03/03/2020   Procedure: ENDOSCOPIC RETROGRADE CHOLANGIOPANCREATOGRAPHY (ERCP);  Surgeon: Rogene Houston, MD;  [facility];  Service: Endoscopy;  Laterality: N/A;   ERCP N/A 04/20/2020   Procedure: ENDOSCOPIC RETROGRADE CHOLANGIOPANCREATOGRAPHY (ERCP);  Surgeon: Rogene Houston, MD;  [facility];  Service: Endoscopy;  Laterality: N/A;  to be done at 7:30am in [place] N/A 04/20/2020   Procedure: STENT REMOVAL;  Surgeon: Rogene Houston, MD;  [facility];  Service: Endoscopy;  Laterality: N/A;   INGUINAL HERNIA REPAIR  1971   bilateral   INSERTION OF SUPRAPUBIC CATHETER  06/09/2017   Procedure: EXCHANGE OF SUPRAPUBIC CATHETER;  Surgeon: Franchot Gallo, MD;  Location: [facility];  Service: Urology;;   INSERTION OF SUPRAPUBIC CATHETER  05/22/2020   Procedure: SUPRAPUBIC CATHETER EXCHANGE;  Surgeon: Franchot Gallo, MD;  Location: [facility];  Service: Urology;;   IR NEPHROSTOMY  EXCHANGE LEFT  03/25/2021   IR NEPHROSTOMY EXCHANGE LEFT  05/20/2021   IR NEPHROSTOMY PLACEMENT LEFT  05/26/2017   IR NEPHROSTOMY PLACEMENT LEFT  05/02/2020   IR NEPHROSTOMY PLACEMENT LEFT  02/15/2021   IR URETERAL STENT PERC REMOVAL MOD SED  03/25/2021   KIDNEY STONE SURGERY  09/13/2015   LITHOTRIPSY N/A 03/03/2020   Procedure: MECHANICAL LITHOTRIPSY WITH REMOVAL OF MULTIPLE STONE FRAGMENTS;  Surgeon: Rogene Houston, MD;  [facility];  Service: Endoscopy;  Laterality: N/A;   NEPHROLITHOTOMY Left 09/13/2015   Procedure: LEFT PERCUTANEOUS NEPHROLITHOTOMY ;  Surgeon: Franchot Gallo, MD;  Location: [facility];  Service: Urology;  Laterality: Left;   NEPHROSTOMY TUBE REMOVAL  05/22/2020   Procedure: NEPHROSTOMY TUBE REMOVAL;  Surgeon: Franchot Gallo, MD;  Location: [facility];  Service: Urology;;   [facility] REMOVAL Right 09/20/2020   Procedure: MINOR REMOVAL CENTRAL LINE;  Surgeon: Virl Cagey, MD;  Location: [facility];  Service: General;  Laterality: Right;  Pt to arrive at 7:30am for procedure   REMOVAL OF STONES N/A 04/20/2020   Procedure: REMOVAL OF STONES;  Surgeon: Rogene Houston, MD;  [facility];  Service: Endoscopy;  Laterality: N/A;   SPHINCTEROTOMY  03/03/2020   Procedure: BILLARY SPHINCTEROTOMY;  Surgeon: Rogene Houston, MD;  [facility];  Service: Endoscopy;;   SUPRAPUBIC CATHETER INSERTION       Social History:   reports that he quit smoking about 33 years ago. His smoking use included cigarettes. He has a 25.00 pack-year smoking history. He has never used smokeless tobacco. He reports that he does not drink alcohol and does not use drugs.   Family History:  His family history includes Cancer in his sister; Cirrhosis in his brother; Coronary artery disease (age of onset: 63) in his father; Heart attack in his brother; Multiple sclerosis in an other family member; Stroke (age of onset: 87) in his mother. There is no history of Colon cancer.    Allergies Allergies  Allergen Reactions   Tetracyclines & Related Anaphylaxis and Rash   Ciprofloxacin     Trouble swallowing unknown reaction according to wife      Home Medications  Prior to Admission medications   Medication Sig Start Date End Date Taking? Authorizing Provider  acetaminophen (TYLENOL) 325 MG tablet Take 650 mg by mouth every 6 (six) hours as needed.   Yes [provider]  amLODipine (NORVASC) 10 MG tablet Take 10 mg by mouth daily. 05/21/21  Yes [provider]  diazepam (VALIUM) 5 MG tablet TAKE 1 TABLET EVERY 12 HOURS AS NEEDED FOR MUSCLE SPASMS 07/08/21  Yes Kathrynn Ducking, MD  HYDROcodone-acetaminophen (NORCO) 7.5-325 MG per tablet Take 1 tablet by mouth 2 (two) times daily. Max APAP 3 GM IN 24 HOURS FROM ALL SOURCES Patient taking differently: Take 1 tablet by mouth in the morning and at bedtime. ALL MEDICATIONS TO BE CRUSHED AND PLACED IN APPLESAUCE 08/22/14  Yes Pandey, Mahima, MD  levothyroxine (SYNTHROID) 75 MCG tablet Take 75 mcg by mouth daily. 07/02/21  Yes [provider]  OXYGEN Inhale 1 L into the lungs every evening.   Yes [provider]  potassium chloride SA (KLOR-CON) 20 MEQ tablet Take 20 mEq by mouth daily. 07/02/21  Yes [provider]  cephALEXin (KEFLEX) 500 MG capsule Take 500 mg by mouth 2 (two) times daily. Patient not taking: No sig reported 07/02/21   [provider]  levothyroxine (SYNTHROID) 50 MCG tablet Take 50 mcg by mouth daily. Patient not taking: No sig reported 07/03/21   [provider]  levothyroxine (SYNTHROID) 88 MCG tablet Take 88 mcg by mouth daily. Patient not taking: No sig reported 12/31/20   [provider]  potassium chloride (KLOR-CON) 10 MEQ tablet Take 10 mEq by mouth daily. Patient not taking: No sig reported 07/01/21   [provider]     Critical care time:     Total critical care time: 47 minutes  Performed by: [employer] care time was exclusive of separately billable procedures and treating other patients.   Critical care was necessary to treat or prevent imminent or life-threatening deterioration.   Critical care was time spent personally by me on the following activities: development of treatment plan with patient and/or surrogate as well as nursing, discussions with consultants, evaluation of patient's response to treatment, examination of patient, obtaining history from patient or surrogate, ordering and performing treatments and interventions, ordering and review of laboratory studies, ordering and review of radiographic studies, pulse oximetry and re-evaluation of patient's condition.   Jacky Kindle MD [place] [facility] See Amion for pager If no response to pager, please call [phone] until 7pm After 7pm, Please call E-link [phone]

## 2021-07-13 NOTE — Progress Notes (Signed)
Pharmacy Antibiotic Note  Jordan Ward is a 80 y.o. male admitted on 07/12/2021 with urosepsis. Patient has an indwelling suprapubic catheter, atrophic right kidney and recurrent hydronephrosis/urolithiasis of the left kidney with left percutaneous nephrostomy tube Per patient, tube stopped working.   Pharmacy has been consulted for piperacillin/tazobactam and vancomycin dosing.  ClCr >69ml/min.  2nd PIV placed.   10/14 Vancomycin 500mg  Q 24 hr Scr used: 1.84 mg/dL Weight: 51 kg Vd coeff: 0.72 L/kg Est AUC: 466   Plan: Change Zosyn 3.375gm IV Q8h to be infused over 4hrs Vancomycin 750mg  x1 load then 500mg  q24hr  Monitor cultures, clinical status, renal fx, vanc level Narrow abx as able and f/u duration    Temp (24hrs), Avg:99.5 F (37.5 C), Min:98.2 F (36.8 C), Max:101 F (38.3 C)  Recent Labs  Lab 07/12/21 1857 07/12/21 1858 07/12/21 2131  WBC 19.3*  --   --   CREATININE 1.84*  --   --   LATICACIDVEN  --  1.5 1.6     Estimated Creatinine Clearance: 23.8 mL/min (A) (by C-G formula based on SCr of 1.84 mg/dL (H)).    Allergies  Allergen Reactions   Tetracyclines & Related Anaphylaxis and Rash   Ciprofloxacin     Trouble swallowing unknown reaction according to wife     Antimicrobials this admission: Pip/tazo 10/14 >>   Vanc 10/14>>  Microbiology results: 10/14 Bcx pending 10/14 Ucx pending   Thank you for allowing pharmacy to be a part of this patient's care.   [name], PharmD, BCPS Clinical Pharmacist

## 2021-07-13 NOTE — Procedures (Signed)
Pre Procedure Dx: Hydronephrosis; Malfunctioning left sided PCN. Post Procedure Dx: Same  Successful left sided PCN exchange and upsizing, now a 12 Fr PCN.  EBL: None  No immediate complications.   Ronny Bacon, MD Pager #: [phone]

## 2021-07-13 NOTE — Consult Note (Signed)
NAME:  Jordan Ward, MRN:  [id number], DOB:  10/10/1940, LOS: 1 ADMISSION DATE:  07/12/2021, CONSULTATION DATE: 07/14/2019 REFERRING MD:  Albertine Patricia, MD , CHIEF COMPLAINT: Left flank pain and fever  History of Present Illness:  80 year old male with multiple sclerosis, diastolic CHF, CKD stage III, coronary artery disease, urolithiasis s/p left nephrostomy since 01/2021 who presented to the ED with complaint of fever, left flank pain and generalized weakness.  Per patient his symptoms started 2 days ago and he noticed no drainage from the left nephrostomy tube which prompted him to come to the emergency department and get evaluated.  Patient denies chest pain, palpitation, headache, weakness, nausea, vomiting or other complaints  In the emergency department CT abdomen pelvis was done which shows severe left hydronephrosis with displacement of nephrostomy tube  Pertinent  Medical History   Past Medical History:  Diagnosis Date   Anemia    Arthritis    Back pain, chronic    Bilateral carotid bruits    C. difficile colitis 09/2011   CAD (coronary artery disease)    Carotid artery stenosis    Cerebrovascular disease    Cerebrovascular disease 08/17/2018   Colon polyps    Complication of cystostomy catheter, initial encounter ([place]) 04/20/2020   Dyslipidemia    Dysphagia 10/07/2011   Encephalopathy    Gait disorder    HA (headache)    High grade dysplasia in colonic adenoma 09/2005   History of kidney stones    HTN (hypertension), malignant 10/06/2011   Hypernatremia    Hypokalemia    Hypothyroidism 10/08/2011   Insomnia    Junctional rhythm    Kidney stones    MS (multiple sclerosis) (HCC)    Neuromuscular disorder (HCC)    MS   OSA (obstructive sleep apnea)    Paroxysmal atrial tachycardia ([place])    Peripheral vascular disease ([place])    Pneumonia 4 yrs ago   Pulmonary fibrosis ([place]) 10/06/2011   Pulmonary nodule 10/08/2011   PVD (peripheral vascular disease) (HCC)    Sacral  ulcer (HCC)    Sleep apnea    cannot tolerate   Stroke ([place])    left sided weakness   Suprapubic catheter ([place])    TIA (transient ischemic attack)    Tremors of nervous system 10/08/2011   Urinary tract infection    Ventral hernia without obstruction or gangrene     Large 8X9cm ventral hernia with loss of domain. CT reads report as diastasis recti with herniation or diastasis recti.  Dr. Constance Haw, Surgery, reviewed CT with radiology and there is herniation with only hernia sac or peritoneum over the bowel and large separation of the rectus muslce (i.e. diastasis recti aka loss of domain).  No surgical intervention recommended given size, age, and health.      Significant Hospital Events: Including procedures, antibiotic start and stop dates in addition to other pertinent events   10/15 admitted to ICU, s/p left perc nephrostomy tube placement by IR  Interim History / Subjective:   Patient complaining of bladder spasms. He has left perc nephrostomy drain placed by IR. He remains on peripheral levophed.   Objective   Blood pressure 92/60, pulse 64, temperature 97.9 F (36.6 C), temperature source Oral, resp. rate 20, weight 50.8 kg, SpO2 99 %.        Intake/Output Summary (Last 24 hours) at 07/13/2021 1305 Last data filed at 07/13/2021 1239 Gross per 24 hour  Intake 3921.55 ml  Output 2975 ml  Net  946.55 ml   Filed Weights   07/12/21 2123  Weight: 50.8 kg    Examination: General: chronically ill appearing male, mild distress HEENT: [place]/AT, moist mucous membranes, sclera anicteric Neuro: Alert, awake, following commands, moving all 4 extremities Chest: clear to auscultation bilaterally, no wheezes or rhonchi Heart: Regular rate and rhythm, no murmurs or gallops Abdomen: Soft, nontender, nondistended, bowel sounds present Skin: No rash GU: suprapubic catheter in place  Resolved Hospital Problem list     Assessment & Plan:  Septic shock due to complicated UTI - he has  received adequate fluid resuscitation, PRN boluses - zosyn + vancomycin for antibiotic coverage based on prior sensitivities - blood and urine cultures pending - wean peripheral levophed for MAP goal of 65  Acute Kidney Injury on CKD Stage IIIb Left-sided hydronephrosis Displaced nephrostomy tube on left Obstructive uropathy - s/p left perc nephrostomy tube placement by IR - monitor renal function  Chronic hypoxic respiratory failure on home oxygen COPD/pulmonary fibrosis, not in exacerbation - continue supplemental oxygen  Chronic HFpEF Hypertension - hold antihypertensives  Hypokalemia/hypomagnesemia - monitor and replete as needed  Hx of multiple sclerosis  Hypothyroidism - continue home synthroid   Best Practice (right click and "Reselect all SmartList Selections" daily)   Diet/type: NPO DVT prophylaxis: SCD GI prophylaxis: N/A Lines: N/A Foley:  N/A Code Status:  full code Last date of multidisciplinary goals of care discussion [pending]  Labs   CBC: Recent Labs  Lab 07/12/21 1857 07/13/21 0242  WBC 19.3* 25.3*  NEUTROABS 17.0*  --   HGB 13.1 11.8*  HCT 38.9* 35.6*  MCV 91.3 90.4  PLT 203 409    Basic Metabolic Panel: Recent Labs  Lab 07/12/21 1857 07/13/21 0242 07/13/21 1226  NA 138 142 141  K 2.7* 3.0* 3.6  CL 101 107 107  CO2 28 25 26   GLUCOSE 119* 133* 127*  BUN 26* 25* 20  CREATININE 1.84* 1.78* 1.49*  CALCIUM 9.0 8.6* 8.6*  MG  --  2.0  --   PHOS  --  2.6  --    GFR: Estimated Creatinine Clearance: 29.4 mL/min (A) (by C-G formula based on SCr of 1.49 mg/dL (H)). Recent Labs  Lab 07/12/21 1857 07/12/21 1858 07/12/21 2131 07/13/21 0242  WBC 19.3*  --   --  25.3*  LATICACIDVEN  --  1.5 1.6  --     Liver Function Tests: Recent Labs  Lab 07/12/21 1857  AST 20  ALT 12  ALKPHOS 135*  BILITOT 0.7  PROT 8.0  ALBUMIN 4.1   No results for input(s): LIPASE, AMYLASE in the last 168 hours. No results for input(s): AMMONIA in  the last 168 hours.  ABG    Component Value Date/Time   PHART 7.345 (L) 07/18/2014 0308   PCO2ART 27.8 (L) 07/18/2014 0308   PO2ART 87.5 07/18/2014 0308   HCO3 14.8 (L) 07/18/2014 0308   TCO2 15.6 07/18/2014 0308   ACIDBASEDEF 9.8 (H) 07/18/2014 0308   O2SAT 95.8 07/18/2014 0308     Coagulation Profile: No results for input(s): INR, PROTIME in the last 168 hours.  Cardiac Enzymes: No results for input(s): CKTOTAL, CKMB, CKMBINDEX, TROPONINI in the last 168 hours.  HbA1C: Hgb A1c MFr Bld  Date/Time Value Ref Range Status  02/14/2021 06:41 AM 5.9 (H) 4.8 - 5.6 % Final    Comment:    (NOTE) Pre diabetes:          5.7%-6.4%  Diabetes:              >  6.4%  Glycemic control for   <7.0% adults with diabetes   07/04/2020 04:28 AM 6.3 (H) 4.8 - 5.6 % Final    Comment:    (NOTE) Pre diabetes:          5.7%-6.4%  Diabetes:              >6.4%  Glycemic control for   <7.0% adults with diabetes     CBG: Recent Labs  Lab 07/13/21 [place]*       Critical care time: 40 minutes    Freda Jackson, MD [facility] Office: [phone]   See Amion for personal pager PCCM on call pager [phone] until 7pm. Please call Elink 7p-7a. [phone]

## 2021-07-13 NOTE — Progress Notes (Signed)
eLink Physician-Brief Progress Note Patient Name: Jordan Ward DOB: [birth year] MRN: [id number]   Date of Service  07/13/2021  HPI/Events of Note  Patient with hypokalemia with ongoing KCL replacement, last resulted K+ was 2.7.  eICU Interventions  KCL 10 meq iv Q 1 hour x 2 ordered to complete the previously contemplated number of KCL runs. Repeat K+ level pending.        Kerry Kass Darryn Kydd 07/13/2021, 3:24 AM

## 2021-07-13 NOTE — Progress Notes (Addendum)
PROGRESS NOTE  Jordan Ward QIH:[id number] DOB: [birth year] DOA: 07/12/2021 PCP: Valentino Nose, FNP  HPI/Recap of past 24 hours:    Jordan Ward  is a 80 y.o. male, with extensive past medical history which includes multiple sclerosis, suprapubic catheter, dilated cardiomyopathy with diastolic CHF, pulmonary fibrosis, CKD 3, gram-negative bacteremia, coronary artery disease, stroke, recurrent urolithiasis status post stent initially, currently status post left nephrostomy since May/2022. -Patient presents to ED secondary to complaints of fever, left flank pain, generalized weakness, fatigue, and poor appetite, and reported drainage of left nephrostomy tube has been stopped, and reports symptoms over last 48 hours, with low output nephrostomy tube In the emergency department CT abdomen pelvis was done which shows severe left hydronephrosis with displacement of nephrostomy tube He was found to be in septic shock due to complicated.  Critical care was consulted.  Patient was consulted to help s/p nephrostomy tube   Subjective: July 13, 2021  Patient seen and examined at bedside Patient has just returned from the OR where he had Nephrostomy tube changed to bigger size.  He denies any complaint    Assessment/Plan: Active Problems:   Chronic suprapubic catheter (HCC)   Chronic diastolic (congestive) heart failure (HCC)   CKD (chronic kidney disease), stage III (HCC)   Hydronephrosis of left kidney   Complicated UTI (urinary tract infection)   Acute unilateral obstructive uropathy   Hydronephrosis, left  #1 septic shock secondary to complicated UTI patient was started on Unasyn we will continue Unasyn Continue to monitor output Patient was admitted to ICU  2.  Left-sided hydronephrosis.  Patient just returned from the OR for upsizing and replacement of his nephrostomy tube  3.  Complicated urinary tract infection continue Unasyn  4.  COPD/pulmonary fibrosis stable not in  exacerbation  5.  Chronic kidney disease stage IIIb  6.  Chronic HF PEF Stable with no acute exacerbation  7.  Obstructive uropathy with hydronephrosis Status post nephrectomy tube placement  8.  Hypokalemia Continue to replete And monitor  Code Status: Full  Severity of Illness: The appropriate patient status for this patient is INPATIENT. Inpatient status is judged to be reasonable and necessary in order to provide the required intensity of service to ensure the patient's safety. The patient's presenting symptoms, physical exam findings, and initial radiographic and laboratory data in the context of their chronic comorbidities is felt to place them at high risk for further clinical deterioration. Furthermore, it is not anticipated that the patient will be medically stable for discharge from the hospital within 2 midnights of admission.  Nephrostomy tube needs to be changed and monitored * I certify that at the point of admission it is my clinical judgment that the patient will require inpatient hospital care spanning beyond 2 midnights from the point of admission due to high intensity of service, high risk for further deterioration and high frequency of surveillance required.*   Family Communication: None at bedside  Disposition Plan: Home Status is: Inpatient   Dispo: The patient is from: Home              Anticipated d/c is to:               Anticipated d/c date is:               Patient currently not medically stable for discharge  Consultants: Interventional radiology Critical care  Procedures: None  Antimicrobials: Unasyn  DVT prophylaxis: Heparin   Objective: Vitals:   07/13/21 1430  07/13/21 1445 07/13/21 1500 07/13/21 1600  BP: (!) 104/52 (!) 109/50 (!) 116/51 (!) 116/54  Pulse:   (!) 57 (!) 57  Resp:   19 20  Temp:      TempSrc:      SpO2:   96% 97%  Weight:        Intake/Output Summary (Last 24 hours) at 07/13/2021 1636 Last data filed at  07/13/2021 1600 Gross per 24 hour  Intake 4524.6 ml  Output 4325 ml  Net 199.6 ml   Filed Weights   07/12/21 2123  Weight: 50.8 kg   Body mass index is 18.08 kg/m.  Exam:  General: 80 y.o. year-old male well developed well nourished in no acute distress.  Alert and oriented x3. Cardiovascular: Regular rate and rhythm with no rubs or gallops.  No thyromegaly or JVD noted.   Respiratory: Clear to auscultation with no wheezes or rales. Good inspiratory effort. Abdomen: Soft nontender nondistended with normal bowel sounds x4 quadrants. Musculoskeletal: No lower extremity edema. 2/4 pulses in all 4 extremities. Skin: No ulcerative lesions noted or rashes, Psychiatry: Mood is appropriate for condition and setting Neurology:    Data Reviewed: CBC: Recent Labs  Lab 07/12/21 1857 07/13/21 0242  WBC 19.3* 25.3*  NEUTROABS 17.0*  --   HGB 13.1 11.8*  HCT 38.9* 35.6*  MCV 91.3 90.4  PLT 203 381   Basic Metabolic Panel: Recent Labs  Lab 07/12/21 1857 07/13/21 0242 07/13/21 1226  NA 138 142 141  K 2.7* 3.0* 3.6  CL 101 107 107  CO2 28 25 26   GLUCOSE 119* 133* 127*  BUN 26* 25* 20  CREATININE 1.84* 1.78* 1.49*  CALCIUM 9.0 8.6* 8.6*  MG  --  2.0  --   PHOS  --  2.6  --    GFR: Estimated Creatinine Clearance: 29.4 mL/min (A) (by C-G formula based on SCr of 1.49 mg/dL (H)). Liver Function Tests: Recent Labs  Lab 07/12/21 1857  AST 20  ALT 12  ALKPHOS 135*  BILITOT 0.7  PROT 8.0  ALBUMIN 4.1   No results for input(s): LIPASE, AMYLASE in the last 168 hours. No results for input(s): AMMONIA in the last 168 hours. Coagulation Profile: No results for input(s): INR, PROTIME in the last 168 hours. Cardiac Enzymes: No results for input(s): CKTOTAL, CKMB, CKMBINDEX, TROPONINI in the last 168 hours. BNP (last 3 results) No results for input(s): PROBNP in the last 8760 hours. HbA1C: No results for input(s): HGBA1C in the last 72 hours. CBG: Recent Labs  Lab  07/13/21 0344  GLUCAP 209*   Lipid Profile: No results for input(s): CHOL, HDL, LDLCALC, TRIG, CHOLHDL, LDLDIRECT in the last 72 hours. Thyroid Function Tests: No results for input(s): TSH, T4TOTAL, FREET4, T3FREE, THYROIDAB in the last 72 hours. Anemia Panel: No results for input(s): VITAMINB12, FOLATE, FERRITIN, TIBC, IRON, RETICCTPCT in the last 72 hours. Urine analysis:    Component Value Date/Time   COLORURINE YELLOW 07/12/2021 2007   APPEARANCEUR CLOUDY (A) 07/12/2021 2007   LABSPEC 1.006 07/12/2021 2007   PHURINE 8.0 07/12/2021 2007   GLUCOSEU NEGATIVE 07/12/2021 2007   HGBUR MODERATE (A) 07/12/2021 2007   BILIRUBINUR NEGATIVE 07/12/2021 2007   [place] NEGATIVE 07/12/2021 2007   PROTEINUR 30 (A) 07/12/2021 2007   UROBILINOGEN 0.2 07/17/2014 1215   NITRITE POSITIVE (A) 07/12/2021 2007   LEUKOCYTESUR LARGE (A) 07/12/2021 2007   Sepsis Labs: @LABRCNTIP (procalcitonin:4,lacticidven:4)  ) Recent Results (from the past 240 hour(s))  Culture, blood (routine x 2)  Status: None (Preliminary result)   Collection Time: 07/12/21  6:59 PM   Specimen: BLOOD RIGHT FOREARM  Result Value Ref Range Status   Specimen Description BLOOD RIGHT FOREARM  Final   Special Requests   Final    BOTTLES DRAWN AEROBIC AND ANAEROBIC Blood Culture adequate volume   Culture   Final    NO GROWTH < 12 HOURS Performed at [facility], [address], [place], [place] [address]    Report Status PENDING  Incomplete  Resp Panel by RT-PCR (Flu A&B, Covid) Nasopharyngeal Swab     Status: None   Collection Time: 07/12/21  7:02 PM   Specimen: Nasopharyngeal Swab; Nasopharyngeal(NP) swabs in vial transport medium  Result Value Ref Range Status   SARS Coronavirus 2 by RT PCR NEGATIVE NEGATIVE Final    Comment: (NOTE) SARS-CoV-2 target nucleic acids are NOT DETECTED.  The SARS-CoV-2 RNA is generally detectable in upper respiratory specimens during the acute phase of infection. The  lowest concentration of SARS-CoV-2 viral copies this assay can detect is 138 copies/mL. A negative result does not preclude SARS-Cov-2 infection and should not be used as the sole basis for treatment or other patient management decisions. A negative result may occur with  improper specimen collection/handling, submission of specimen other than nasopharyngeal swab, presence of viral mutation(s) within the areas targeted by this assay, and inadequate number of viral copies(<138 copies/mL). A negative result must be combined with clinical observations, patient history, and epidemiological information. The expected result is Negative.  Fact Sheet for Patients:  EntrepreneurPulse.com.au  Fact Sheet for Healthcare Providers:  IncredibleEmployment.be  This test is no t yet approved or cleared by the [place] FDA and  has been authorized for detection and/or diagnosis of SARS-CoV-2 by FDA under an Emergency Use Authorization (EUA). This EUA will remain  in effect (meaning this test can be used) for the duration of the COVID-19 declaration under Section 564(b)(1) of the Act, 21 U.S.C.section 360bbb-3(b)(1), unless the authorization is terminated  or revoked sooner.       Influenza A by PCR NEGATIVE NEGATIVE Final   Influenza B by PCR NEGATIVE NEGATIVE Final    Comment: (NOTE) The Xpert Xpress SARS-CoV-2/FLU/RSV plus assay is intended as an aid in the diagnosis of influenza from Nasopharyngeal swab specimens and should not be used as a sole basis for treatment. Nasal washings and aspirates are unacceptable for Xpert Xpress SARS-CoV-2/FLU/RSV testing.  Fact Sheet for Patients: EntrepreneurPulse.com.au  Fact Sheet for Healthcare Providers: IncredibleEmployment.be  This test is not yet approved or cleared by the [place] FDA and has been authorized for detection and/or diagnosis of SARS-CoV-2 by FDA under  an Emergency Use Authorization (EUA). This EUA will remain in effect (meaning this test can be used) for the duration of the COVID-19 declaration under Section 564(b)(1) of the Act, 21 U.S.C. section 360bbb-3(b)(1), unless the authorization is terminated or revoked.  Performed at [facility], [address], [place], [place] [address]   Culture, blood (routine x 2)     Status: None (Preliminary result)   Collection Time: 07/12/21  7:45 PM   Specimen: Right Antecubital; Blood  Result Value Ref Range Status   Specimen Description RIGHT ANTECUBITAL  Final   Special Requests   Final    BOTTLES DRAWN AEROBIC ONLY Blood Culture adequate volume   Culture   Final    NO GROWTH < 12 HOURS Performed at [facility], [address] [place], [place] [address]    Report Status  PENDING  Incomplete  MRSA Next Gen by PCR, Nasal     Status: None   Collection Time: 07/13/21  6:50 AM   Specimen: Nasal Mucosa; Nasal Swab  Result Value Ref Range Status   MRSA by PCR Next Gen NOT DETECTED NOT DETECTED Final    Comment: (NOTE) The GeneXpert MRSA Assay (FDA approved for NASAL specimens only), is one component of a comprehensive MRSA colonization surveillance program. It is not intended to diagnose MRSA infection nor to guide or monitor treatment for MRSA infections. Test performance is not FDA approved in patients less than 4 years old. Performed at [facility], [place] [address] [place], [place] [address]       Studies: CT Abdomen Pelvis Wo Contrast  Result Date: 07/12/2021 CLINICAL DATA:  Left nephrostomy tube with no output. EXAM: CT ABDOMEN AND PELVIS WITHOUT CONTRAST TECHNIQUE: Multidetector CT imaging of the abdomen and pelvis was performed following the standard protocol without IV contrast. COMPARISON:  07/03/2021 FINDINGS: LOWER CHEST: Bibasilar fibrotic appearance is unchanged HEPATOBILIARY: Normal hepatic contours. No intra- or extrahepatic biliary dilatation. There is  unchanged pneumobilia. Gallbladder is surgically absent. PANCREAS: Normal pancreas. No ductal dilatation or peripancreatic fluid collection. SPLEEN: Normal. ADRENALS/URINARY TRACT: The adrenal glands are normal. Severe right renal atrophy. There is severe left hydronephrosis. There is a nephrostomy tube with its coil in the renal pelvis, but the tube has backed out slightly. The proximal metallic marker now lies near the lateral edge of the renal parenchyma, where as it was previously at the border of the renal pelvis. There is a suprapubic catheter in the bladder. STOMACH/BOWEL: There is no hiatal hernia. Normal duodenal course and caliber. Ventral abdominal hernia contains multiple loops of nondilated small bowel no focal colonic abnormality. Normal appendix. VASCULAR/LYMPHATIC: There is calcific atherosclerosis of the abdominal aorta. No lymphadenopathy. REPRODUCTIVE: Normal prostate size with symmetric seminal vesicles. MUSCULOSKELETAL. No bony spinal canal stenosis or focal osseous abnormality. OTHER: None. IMPRESSION: 1. Severe left hydronephrosis. Nephrostomy tube has backed out slightly. 2. Ventral abdominal hernia contains multiple loops of nondilated small bowel. Aortic Atherosclerosis (ICD10-I70.0). Electronically Signed   By: Ulyses Jarred M.D.   On: 07/12/2021 20:15   IR NEPHROSTOMY EXCHANGE LEFT  Result Date: 07/13/2021 INDICATION: History of neurogenic bladder ultimately post left-sided nephrostomy catheter placement. The patient was set to undergo routine fluoroscopic guided exchange early next week however presented to the emergency department with symptoms of urosepsis with CT scan demonstrating recurrent left-sided hydronephrosis despite appropriate positioning nephrostomy catheter. As such, we will proceed with fluoroscopic guided nephrostomy catheter exchange and up sizing. EXAM: FLUOROSCOPIC GUIDED LEFT SIDED NEPHROSTOMY CATHETER EXCHANGE AND UP SIZING COMPARISON:  Multiple previous  fluoroscopic guided left-sided nephrostomy catheter exchanges, most recently on 05/20/2021 CT abdomen pelvis-07/12/2021; 07/03/2021 CONTRAST:  10 cc Omnipaque 300 was administered into the collecting system FLUOROSCOPY TIME:  12 seconds COMPLICATIONS: None immediate. TECHNIQUE: Informed written consent was obtained from the patient after a discussion of the risks, benefits and alternatives to treatment. Questions regarding the procedure were encouraged and answered. A timeout was performed prior to the initiation of the procedure. The left flank and external portion of existing nephrostomy catheter were prepped and draped in the usual sterile fashion. A sterile drape was applied covering the operative field. Maximum barrier sterile technique with sterile gowns and gloves were used for the procedure. A timeout was performed prior to the initiation of the procedure. A pre procedural spot fluoroscopic image was obtained after contrast was injected via  the existing nephrostomy catheter demonstrating appropriate positioning within the renal pelvis. The existing nephrostomy catheter was cut and cannulated with a Benson wire which was coiled within the renal pelvis. Under intermittent fluoroscopic guidance, the existing nephrostomy catheter was exchanged for a new slightly larger now 12 [place] all-purpose drainage catheter. Contrast injection confirmed appropriate positioning within the renal pelvis and a post exchange fluoroscopic image was obtained. The catheter was locked and secured to the skin with a suture. A dressing was placed. The patient tolerated the procedure well without immediate postprocedural complication. FINDINGS: The existing nephrostomy catheter is appropriately positioned and functioning. After successful fluoroscopic guided exchange, the new nephrostomy catheter is coiled and locked within the left renal pelvis. IMPRESSION: Successful fluoroscopic guided exchange and up sizing now 12 [place] left-sided  percutaneous nephrostomy catheter. PLAN: At the time of the exchange, the patient's left-sided nephrostomy catheter appears to have been appropriately functioning suggesting intermittent occlusion likely due to incrustation and/or debris. As such, the patient and the patient's family should be educated regarding flushing the nephrostomy catheter if and when there is reduced output to ensure preservation of catheter patency. Electronically Signed   By: Sandi Mariscal M.D.   On: 07/13/2021 10:41    Scheduled Meds:  Chlorhexidine Gluconate Cloth  6 each Topical Daily   heparin injection (subcutaneous)  5,000 Units Subcutaneous Q8H   levothyroxine  75 mcg Oral Q0600   potassium chloride SA  20 mEq Oral Daily    Continuous Infusions:  sodium chloride Stopped (07/13/21 1331)   norepinephrine (LEVOPHED) Adult infusion 2 mcg/min (07/13/21 1440)   piperacillin-tazobactam (ZOSYN)  IV 12.5 mL/hr at 07/13/21 1520   sodium chloride       LOS: 1 day     Cristal Deer, MD [facility]  To reach me or the doctor on call, go to: [email or web address] Password [facility]  07/13/2021, 4:36 PM

## 2021-07-13 NOTE — Progress Notes (Signed)
Confirmed home synthroid dose = 75 mcg per day with wife Eudelia Bunch, Pharm.D [phone] 07/13/2021 1:25 PM

## 2021-07-14 DIAGNOSIS — L89609 Pressure ulcer of unspecified heel, unspecified stage: Secondary | ICD-10-CM | POA: Diagnosis present

## 2021-07-14 DIAGNOSIS — N139 Obstructive and reflux uropathy, unspecified: Secondary | ICD-10-CM | POA: Diagnosis not present

## 2021-07-14 DIAGNOSIS — Z9359 Other cystostomy status: Secondary | ICD-10-CM | POA: Diagnosis not present

## 2021-07-14 DIAGNOSIS — N1330 Unspecified hydronephrosis: Secondary | ICD-10-CM | POA: Diagnosis not present

## 2021-07-14 DIAGNOSIS — N1832 Chronic kidney disease, stage 3b: Secondary | ICD-10-CM | POA: Diagnosis not present

## 2021-07-14 DIAGNOSIS — I5032 Chronic diastolic (congestive) heart failure: Secondary | ICD-10-CM | POA: Diagnosis not present

## 2021-07-14 LAB — CBC
HCT: 35.6 % — ABNORMAL LOW (ref 39.0–52.0)
Hemoglobin: 11.2 g/dL — ABNORMAL LOW (ref 13.0–17.0)
MCH: 29.6 pg (ref 26.0–34.0)
MCHC: 31.5 g/dL (ref 30.0–36.0)
MCV: 93.9 fL (ref 80.0–100.0)
Platelets: 163 10*3/uL (ref 150–400)
RBC: 3.79 MIL/uL — ABNORMAL LOW (ref 4.22–5.81)
RDW: 14.5 % (ref 11.5–15.5)
WBC: 10 10*3/uL (ref 4.0–10.5)
nRBC: 0 % (ref 0.0–0.2)

## 2021-07-14 LAB — URINE CULTURE

## 2021-07-14 LAB — BASIC METABOLIC PANEL
Anion gap: 8 (ref 5–15)
BUN: 25 mg/dL — ABNORMAL HIGH (ref 8–23)
CO2: 26 mmol/L (ref 22–32)
Calcium: 8.4 mg/dL — ABNORMAL LOW (ref 8.9–10.3)
Chloride: 107 mmol/L (ref 98–111)
Creatinine, Ser: 1.78 mg/dL — ABNORMAL HIGH (ref 0.61–1.24)
GFR, Estimated: 39 mL/min — ABNORMAL LOW (ref >60)
Glucose, Bld: 94 mg/dL (ref 70–99)
Potassium: 3.2 mmol/L — ABNORMAL LOW (ref 3.5–5.1)
Sodium: 141 mmol/L (ref 135–145)

## 2021-07-14 MED ORDER — SODIUM CHLORIDE 0.9 % IV SOLN
INTRAVENOUS | Status: DC | PRN
  Administered 2021-07-14: 250 mL via INTRAVENOUS

## 2021-07-14 MED ORDER — POTASSIUM CHLORIDE 20 MEQ PO PACK
60.0000 meq | PACK | Freq: Once | ORAL | Status: AC
  Administered 2021-07-14: 60 meq via ORAL
  Filled 2021-07-14: qty 3

## 2021-07-14 NOTE — Progress Notes (Signed)
PROGRESS NOTE  Jordan Ward IWL:[id number] DOB: 1940/11/17 DOA: 07/12/2021 PCP: Valentino Nose, FNP  HPI/Recap of past 24 hours:    Jordan Ward  is a 80 y.o. male, with extensive past medical history which includes multiple sclerosis, suprapubic catheter, dilated cardiomyopathy with diastolic CHF, pulmonary fibrosis, CKD 3, gram-negative bacteremia, coronary artery disease, stroke, recurrent urolithiasis status post stent initially, currently status post left nephrostomy since May/2022. -Patient presents to ED secondary to complaints of fever, left flank pain, generalized weakness, fatigue, and poor appetite, and reported drainage of left nephrostomy tube has been stopped, and reports symptoms over last 48 hours, with low output nephrostomy tube In the emergency department CT abdomen pelvis was done which shows severe left hydronephrosis with displacement of nephrostomy tube He was found to be in septic shock due to complicated.  Critical care was consulted.  Patient was consulted to help s/p nephrostomy tube   Subjective: July 13, 2021  Patient seen and examined at bedside Patient has just returned from the OR where he had Nephrostomy tube changed to bigger size.  He denies any complaint  July 14, 2021: Patient seen and examined at bedside he is eating breakfast Denies any complaints. He stated he lives at home with his wife and daughter Jordan Ward he is usually ambulating with walker May be able to transfer him to MedSurg this afternoon    Assessment/Plan: Active Problems:   Chronic suprapubic catheter (HCC)   Chronic diastolic (congestive) heart failure (HCC)   CKD (chronic kidney disease), stage III ([place])   Hydronephrosis of left kidney   Complicated UTI (urinary tract infection)   Acute unilateral obstructive uropathy   Hydronephrosis, left  #1 septic shock secondary to complicated UTI patient was started on Unasyn we will continue Unasyn Continue to monitor  output Patient was admitted to ICU Critical care is signing off patient is being transferred to stepdown  2.  Left-sided hydronephrosis.  Patient just returned from the OR for upsizing and replacement of his nephrostomy tube  3.  Complicated urinary tract infection continue Unasyn  4.  COPD/pulmonary fibrosis stable not in exacerbation  5.  Chronic kidney disease stage IIIb  6.  Chronic HF PEF Stable with no acute exacerbation  7.  Obstructive uropathy with hydronephrosis Status post nephrectomy tube placement  8.  Hypokalemia Potassium this morning is still low is 3.2 after replacement yesterday  Continue to replete And monitor  Code Status: Full  Severity of Illness: The appropriate patient status for this patient is INPATIENT. Inpatient status is judged to be reasonable and necessary in order to provide the required intensity of service to ensure the patient's safety. The patient's presenting symptoms, physical exam findings, and initial radiographic and laboratory data in the context of their chronic comorbidities is felt to place them at high risk for further clinical deterioration. Furthermore, it is not anticipated that the patient will be medically stable for discharge from the hospital within 2 midnights of admission.  Nephrostomy tube needs to be changed and monitored * I certify that at the point of admission it is my clinical judgment that the patient will require inpatient hospital care spanning beyond 2 midnights from the point of admission due to high intensity of service, high risk for further deterioration and high frequency of surveillance required.*   Family Communication: None at bedside  Disposition Plan: Home Status is: Inpatient   Dispo: The patient is from: Home  Anticipated d/c is to: Home              Anticipated d/c date is: 1 to 2 days              Patient currently not medically stable for discharge  Consultants: Interventional  radiology Critical care  Procedures: None  Antimicrobials: Unasyn  DVT prophylaxis: Heparin   Objective: Vitals:   07/14/21 0600 07/14/21 0700 07/14/21 0730 07/14/21 0800  BP: (!) 129/55 (!) 120/58  (!) 129/58  Pulse: (!) 46 (!) 51  (!) 50  Resp: 16 17  13   Temp:   97.9 F (36.6 C)   TempSrc:      SpO2: 99% 99%  96%  Weight:        Intake/Output Summary (Last 24 hours) at 07/14/2021 1002 Last data filed at 07/14/2021 0953 Gross per 24 hour  Intake 1532.28 ml  Output 3725 ml  Net -2192.72 ml    Filed Weights   07/12/21 2123  Weight: 50.8 kg   Body mass index is 18.08 kg/m.  Exam:  General: 80 y.o. year-old male well developed well nourished in no acute distress.  Alert and oriented x3. Cardiovascular: Regular rate and rhythm with no rubs or gallops.  No thyromegaly or JVD noted.   Respiratory: Clear to auscultation with no wheezes or rales. Good inspiratory effort. Abdomen: Soft nontender nondistended with normal bowel sounds x4 quadrants. Musculoskeletal: No lower extremity edema. 2/4 pulses in all 4 extremities. Skin: Bilateral heel ulceration/abrasion Psychiatry: Mood is appropriate for condition and setting Neurology:    Data Reviewed: CBC: Recent Labs  Lab 07/12/21 1857 07/13/21 0242 07/14/21 0252  WBC 19.3* 25.3* 10.0  NEUTROABS 17.0*  --   --   HGB 13.1 11.8* 11.2*  HCT 38.9* 35.6* 35.6*  MCV 91.3 90.4 93.9  PLT 203 195 867    Basic Metabolic Panel: Recent Labs  Lab 07/12/21 1857 07/13/21 0242 07/13/21 1226 07/14/21 0252  NA 138 142 141 141  K 2.7* 3.0* 3.6 3.2*  CL 101 107 107 107  CO2 28 25 26 26   GLUCOSE 119* 133* 127* 94  BUN 26* 25* 20 25*  CREATININE 1.84* 1.78* 1.49* 1.78*  CALCIUM 9.0 8.6* 8.6* 8.4*  MG  --  2.0  --   --   PHOS  --  2.6  --   --     GFR: Estimated Creatinine Clearance: 24.6 mL/min (A) (by C-G formula based on SCr of 1.78 mg/dL (H)). Liver Function Tests: Recent Labs  Lab 07/12/21 1857  AST 20   ALT 12  ALKPHOS 135*  BILITOT 0.7  PROT 8.0  ALBUMIN 4.1    No results for input(s): LIPASE, AMYLASE in the last 168 hours. No results for input(s): AMMONIA in the last 168 hours. Coagulation Profile: No results for input(s): INR, PROTIME in the last 168 hours. Cardiac Enzymes: No results for input(s): CKTOTAL, CKMB, CKMBINDEX, TROPONINI in the last 168 hours. BNP (last 3 results) No results for input(s): PROBNP in the last 8760 hours. HbA1C: No results for input(s): HGBA1C in the last 72 hours. CBG: Recent Labs  Lab 07/13/21 0344  GLUCAP 209*    Lipid Profile: No results for input(s): CHOL, HDL, LDLCALC, TRIG, CHOLHDL, LDLDIRECT in the last 72 hours. Thyroid Function Tests: No results for input(s): TSH, T4TOTAL, FREET4, T3FREE, THYROIDAB in the last 72 hours. Anemia Panel: No results for input(s): VITAMINB12, FOLATE, FERRITIN, TIBC, IRON, RETICCTPCT in the last 72 hours. Urine analysis:  Component Value Date/Time   COLORURINE YELLOW 07/12/2021 2007   APPEARANCEUR CLOUDY (A) 07/12/2021 2007   LABSPEC 1.006 07/12/2021 2007   PHURINE 8.0 07/12/2021 2007   GLUCOSEU NEGATIVE 07/12/2021 2007   HGBUR MODERATE (A) 07/12/2021 2007   [place] NEGATIVE 07/12/2021 2007   [place] NEGATIVE 07/12/2021 2007   PROTEINUR 30 (A) 07/12/2021 2007   UROBILINOGEN 0.2 07/17/2014 1215   NITRITE POSITIVE (A) 07/12/2021 2007   LEUKOCYTESUR LARGE (A) 07/12/2021 2007   Sepsis Labs: @LABRCNTIP (procalcitonin:4,lacticidven:4)  ) Recent Results (from the past 240 hour(s))  Culture, blood (routine x 2)     Status: None (Preliminary result)   Collection Time: 07/12/21  6:59 PM   Specimen: BLOOD RIGHT FOREARM  Result Value Ref Range Status   Specimen Description BLOOD RIGHT FOREARM  Final   Special Requests   Final    BOTTLES DRAWN AEROBIC AND ANAEROBIC Blood Culture adequate volume   Culture   Final    NO GROWTH 2 DAYS Performed at [facility], [address] [place], [place]  [address]    Report Status PENDING  Incomplete  Resp Panel by RT-PCR (Flu A&B, Covid) Nasopharyngeal Swab     Status: None   Collection Time: 07/12/21  7:02 PM   Specimen: Nasopharyngeal Swab; Nasopharyngeal(NP) swabs in vial transport medium  Result Value Ref Range Status   SARS Coronavirus 2 by RT PCR NEGATIVE NEGATIVE Final    Comment: (NOTE) SARS-CoV-2 target nucleic acids are NOT DETECTED.  The SARS-CoV-2 RNA is generally detectable in upper respiratory specimens during the acute phase of infection. The lowest concentration of SARS-CoV-2 viral copies this assay can detect is 138 copies/mL. A negative result does not preclude SARS-Cov-2 infection and should not be used as the sole basis for treatment or other patient management decisions. A negative result may occur with  improper specimen collection/handling, submission of specimen other than nasopharyngeal swab, presence of viral mutation(s) within the areas targeted by this assay, and inadequate number of viral copies(<138 copies/mL). A negative result must be combined with clinical observations, patient history, and epidemiological information. The expected result is Negative.  Fact Sheet for Patients:  EntrepreneurPulse.com.au  Fact Sheet for Healthcare Providers:  IncredibleEmployment.be  This test is no t yet approved or cleared by the [place] FDA and  has been authorized for detection and/or diagnosis of SARS-CoV-2 by FDA under an Emergency Use Authorization (EUA). This EUA will remain  in effect (meaning this test can be used) for the duration of the COVID-19 declaration under Section 564(b)(1) of the Act, 21 U.S.C.section 360bbb-3(b)(1), unless the authorization is terminated  or revoked sooner.       Influenza A by PCR NEGATIVE NEGATIVE Final   Influenza B by PCR NEGATIVE NEGATIVE Final    Comment: (NOTE) The Xpert Xpress SARS-CoV-2/FLU/RSV plus assay is intended as an  aid in the diagnosis of influenza from Nasopharyngeal swab specimens and should not be used as a sole basis for treatment. Nasal washings and aspirates are unacceptable for Xpert Xpress SARS-CoV-2/FLU/RSV testing.  Fact Sheet for Patients: EntrepreneurPulse.com.au  Fact Sheet for Healthcare Providers: IncredibleEmployment.be  This test is not yet approved or cleared by the [place] FDA and has been authorized for detection and/or diagnosis of SARS-CoV-2 by FDA under an Emergency Use Authorization (EUA). This EUA will remain in effect (meaning this test can be used) for the duration of the COVID-19 declaration under Section 564(b)(1) of the Act, 21 U.S.C. section 360bbb-3(b)(1), unless the authorization is terminated or revoked.  Performed at [facility], [address], [place], [place] [address]   Culture, blood (routine x 2)     Status: None (Preliminary result)   Collection Time: 07/12/21  7:45 PM   Specimen: Right Antecubital; Blood  Result Value Ref Range Status   Specimen Description RIGHT ANTECUBITAL  Final   Special Requests   Final    BOTTLES DRAWN AEROBIC ONLY Blood Culture adequate volume   Culture   Final    NO GROWTH 2 DAYS Performed at [facility], [address] [place], [place] [address]    Report Status PENDING  Incomplete  MRSA Next Gen by PCR, Nasal     Status: None   Collection Time: 07/13/21  6:50 AM   Specimen: Nasal Mucosa; Nasal Swab  Result Value Ref Range Status   MRSA by PCR Next Gen NOT DETECTED NOT DETECTED Final    Comment: (NOTE) The GeneXpert MRSA Assay (FDA approved for NASAL specimens only), is one component of a comprehensive MRSA colonization surveillance program. It is not intended to diagnose MRSA infection nor to guide or monitor treatment for MRSA infections. Test performance is not FDA approved in patients less than 78 years old. Performed at [facility], [place] [address], [place], [place] [address]       Studies: IR NEPHROSTOMY EXCHANGE LEFT  Result Date: 07/13/2021 INDICATION: History of neurogenic bladder ultimately post left-sided nephrostomy catheter placement. The patient was set to undergo routine fluoroscopic guided exchange early next week however presented to the emergency department with symptoms of urosepsis with CT scan demonstrating recurrent left-sided hydronephrosis despite appropriate positioning nephrostomy catheter. As such, we will proceed with fluoroscopic guided nephrostomy catheter exchange and up sizing. EXAM: FLUOROSCOPIC GUIDED LEFT SIDED NEPHROSTOMY CATHETER EXCHANGE AND UP SIZING COMPARISON:  Multiple previous fluoroscopic guided left-sided nephrostomy catheter exchanges, most recently on 05/20/2021 CT abdomen pelvis-07/12/2021; 07/03/2021 CONTRAST:  10 cc Omnipaque 300 was administered into the collecting system FLUOROSCOPY TIME:  12 seconds COMPLICATIONS: None immediate. TECHNIQUE: Informed written consent was obtained from the patient after a discussion of the risks, benefits and alternatives to treatment. Questions regarding the procedure were encouraged and answered. A timeout was performed prior to the initiation of the procedure. The left flank and external portion of existing nephrostomy catheter were prepped and draped in the usual sterile fashion. A sterile drape was applied covering the operative field. Maximum barrier sterile technique with sterile gowns and gloves were used for the procedure. A timeout was performed prior to the initiation of the procedure. A pre procedural spot fluoroscopic image was obtained after contrast was injected via the existing nephrostomy catheter demonstrating appropriate positioning within the renal pelvis. The existing nephrostomy catheter was cut and cannulated with a Benson wire which was coiled within the renal pelvis. Under intermittent fluoroscopic guidance, the existing nephrostomy catheter was  exchanged for a new slightly larger now 12 [place] all-purpose drainage catheter. Contrast injection confirmed appropriate positioning within the renal pelvis and a post exchange fluoroscopic image was obtained. The catheter was locked and secured to the skin with a suture. A dressing was placed. The patient tolerated the procedure well without immediate postprocedural complication. FINDINGS: The existing nephrostomy catheter is appropriately positioned and functioning. After successful fluoroscopic guided exchange, the new nephrostomy catheter is coiled and locked within the left renal pelvis. IMPRESSION: Successful fluoroscopic guided exchange and up sizing now 12 [place] left-sided percutaneous nephrostomy catheter. PLAN: At the time of the exchange, the patient's left-sided nephrostomy catheter appears to  have been appropriately functioning suggesting intermittent occlusion likely due to incrustation and/or debris. As such, the patient and the patient's family should be educated regarding flushing the nephrostomy catheter if and when there is reduced output to ensure preservation of catheter patency. Electronically Signed   By: Sandi Mariscal M.D.   On: 07/13/2021 10:41    Scheduled Meds:  Chlorhexidine Gluconate Cloth  6 each Topical Daily   heparin injection (subcutaneous)  5,000 Units Subcutaneous Q8H   levothyroxine  75 mcg Oral Q0600   potassium chloride SA  20 mEq Oral Daily   sodium chloride flush  10 mL Intracatheter Q8H    Continuous Infusions:  sodium chloride Stopped (07/14/21 0627)   norepinephrine (LEVOPHED) Adult infusion 2 mcg/min (07/13/21 1440)   piperacillin-tazobactam (ZOSYN)  IV 12.5 mL/hr at 07/14/21 0900   sodium chloride       LOS: 2 days     Cristal Deer, MD [facility]  To reach me or the doctor on call, go to: [email or web address] Password [email or web address]  07/14/2021, 10:02 AM

## 2021-07-14 NOTE — Progress Notes (Signed)
NAME:  Jordan Ward, MRN:  [id number], DOB:  12/21/1940, LOS: 2 ADMISSION DATE:  07/12/2021, CONSULTATION DATE: 07/14/2019 REFERRING MD:  Albertine Patricia, MD , CHIEF COMPLAINT: Left flank pain and fever  History of Present Illness:  80 year old male with multiple sclerosis, diastolic CHF, CKD stage III, coronary artery disease, urolithiasis s/p left nephrostomy since 01/2021 who presented to the ED with complaint of fever, left flank pain and generalized weakness.  Per patient his symptoms started 2 days ago and he noticed no drainage from the left nephrostomy tube which prompted him to come to the emergency department and get evaluated.  Patient denies chest pain, palpitation, headache, weakness, nausea, vomiting or other complaints  In the emergency department CT abdomen pelvis was done which shows severe left hydronephrosis with displacement of nephrostomy tube  Pertinent  Medical History   Past Medical History:  Diagnosis Date   Anemia    Arthritis    Back pain, chronic    Bilateral carotid bruits    C. difficile colitis 09/2011   CAD (coronary artery disease)    Carotid artery stenosis    Cerebrovascular disease    Cerebrovascular disease 08/17/2018   Colon polyps    Complication of cystostomy catheter, initial encounter ([place]) 04/20/2020   Dyslipidemia    Dysphagia 10/07/2011   Encephalopathy    Gait disorder    HA (headache)    High grade dysplasia in colonic adenoma 09/2005   History of kidney stones    HTN (hypertension), malignant 10/06/2011   Hypernatremia    Hypokalemia    Hypothyroidism 10/08/2011   Insomnia    Junctional rhythm    Kidney stones    MS (multiple sclerosis) (HCC)    Neuromuscular disorder (HCC)    MS   OSA (obstructive sleep apnea)    Paroxysmal atrial tachycardia ([place])    Peripheral vascular disease ([place])    Pneumonia 4 yrs ago   Pulmonary fibrosis ([place]) 10/06/2011   Pulmonary nodule 10/08/2011   PVD (peripheral vascular disease) (HCC)    Sacral  ulcer (HCC)    Sleep apnea    cannot tolerate   Stroke ([place])    left sided weakness   Suprapubic catheter ([place])    TIA (transient ischemic attack)    Tremors of nervous system 10/08/2011   Urinary tract infection    Ventral hernia without obstruction or gangrene     Large 8X9cm ventral hernia with loss of domain. CT reads report as diastasis recti with herniation or diastasis recti.  Dr. Constance Haw, Surgery, reviewed CT with radiology and there is herniation with only hernia sac or peritoneum over the bowel and large separation of the rectus muslce (i.e. diastasis recti aka loss of domain).  No surgical intervention recommended given size, age, and health.      Significant Hospital Events: Including procedures, antibiotic start and stop dates in addition to other pertinent events   10/15 admitted to ICU, s/p left perc nephrostomy tube placement by IR  Interim History / Subjective:   No acute events overnight. Weaned off vasopressors overnight. Tolerating oral diet. No complaints at this time.  Objective   Blood pressure (!) 120/58, pulse (!) 51, temperature (!) 97.4 F (36.3 C), temperature source Oral, resp. rate 17, weight 50.8 kg, SpO2 99 %.        Intake/Output Summary (Last 24 hours) at 07/14/2021 0825 Last data filed at 07/14/2021 0600 Gross per 24 hour  Intake 1495.94 ml  Output 3250 ml  Net -1754.06  ml   Filed Weights   07/12/21 2123  Weight: 50.8 kg    Examination: General: chronically ill appearing male, no distress HEENT: [place]/AT, moist mucous membranes, sclera anicteric Neuro: Alert, awake, following commands, moving all 4 extremities Chest: clear to auscultation bilaterally, no wheezes or rhonchi Heart: Regular rate and rhythm, no murmurs or gallops Abdomen: Soft, nontender, nondistended, bowel sounds present Skin: No rash GU: suprapubic catheter in place, left perc nephrostomy tube in place.  Resolved Hospital Problem list     Assessment & Plan:  Septic  shock due to complicated UTI - he has received adequate fluid resuscitation, PRN boluses - zosyn for antibiotic coverage based on prior sensitivities. Recent urine culture showing multiple species. Will finish a 7 day course of zosyn. - blood cultures pending - weaned off levophed overnight  Acute Kidney Injury on CKD Stage IIIb Left-sided hydronephrosis Displaced nephrostomy tube on left Obstructive uropathy - s/p left perc nephrostomy tube placement by IR - monitor renal function  Chronic hypoxic respiratory failure on home oxygen COPD/pulmonary fibrosis, not in exacerbation - continue supplemental oxygen, wean for SpO2 90-92%  Chronic HFpEF Hypertension - hold antihypertensives  Hypokalemia/hypomagnesemia - monitor and replete as needed  Hx of multiple sclerosis  Hypothyroidism - continue home synthroid   Best Practice (right click and "Reselect all SmartList Selections" daily)   Diet/type: Regular consistency (see orders) DVT prophylaxis: SCD GI prophylaxis: N/A Lines: N/A Foley:  N/A Code Status:  full code Last date of multidisciplinary goals of care discussion [pending]  Labs   CBC: Recent Labs  Lab 07/12/21 1857 07/13/21 0242 07/14/21 0252  WBC 19.3* 25.3* 10.0  NEUTROABS 17.0*  --   --   HGB 13.1 11.8* 11.2*  HCT 38.9* 35.6* 35.6*  MCV 91.3 90.4 93.9  PLT 203 195 096    Basic Metabolic Panel: Recent Labs  Lab 07/12/21 1857 07/13/21 0242 07/13/21 1226 07/14/21 0252  NA 138 142 141 141  K 2.7* 3.0* 3.6 3.2*  CL 101 107 107 107  CO2 28 25 26 26   GLUCOSE 119* 133* 127* 94  BUN 26* 25* 20 25*  CREATININE 1.84* 1.78* 1.49* 1.78*  CALCIUM 9.0 8.6* 8.6* 8.4*  MG  --  2.0  --   --   PHOS  --  2.6  --   --    GFR: Estimated Creatinine Clearance: 24.6 mL/min (A) (by C-G formula based on SCr of 1.78 mg/dL (H)). Recent Labs  Lab 07/12/21 1857 07/12/21 1858 07/12/21 2131 07/13/21 0242 07/14/21 0252  WBC 19.3*  --   --  25.3* 10.0   LATICACIDVEN  --  1.5 1.6  --   --     Liver Function Tests: Recent Labs  Lab 07/12/21 1857  AST 20  ALT 12  ALKPHOS 135*  BILITOT 0.7  PROT 8.0  ALBUMIN 4.1   No results for input(s): LIPASE, AMYLASE in the last 168 hours. No results for input(s): AMMONIA in the last 168 hours.  ABG    Component Value Date/Time   PHART 7.345 (L) 07/18/2014 0308   PCO2ART 27.8 (L) 07/18/2014 0308   PO2ART 87.5 07/18/2014 0308   HCO3 14.8 (L) 07/18/2014 0308   TCO2 15.6 07/18/2014 0308   ACIDBASEDEF 9.8 (H) 07/18/2014 0308   O2SAT 95.8 07/18/2014 0308     Coagulation Profile: No results for input(s): INR, PROTIME in the last 168 hours.  Cardiac Enzymes: No results for input(s): CKTOTAL, CKMB, CKMBINDEX, TROPONINI in the last 168 hours.  HbA1C:  Hgb A1c MFr Bld  Date/Time Value Ref Range Status  02/14/2021 06:41 AM 5.9 (H) 4.8 - 5.6 % Final    Comment:    (NOTE) Pre diabetes:          5.7%-6.4%  Diabetes:              >6.4%  Glycemic control for   <7.0% adults with diabetes   07/04/2020 04:28 AM 6.3 (H) 4.8 - 5.6 % Final    Comment:    (NOTE) Pre diabetes:          5.7%-6.4%  Diabetes:              >6.4%  Glycemic control for   <7.0% adults with diabetes     CBG: Recent Labs  Lab 07/13/21 [place] 209*       Critical care time: n/a minutes    Freda Jackson, MD [facility] Office: [phone]   See Amion for personal pager PCCM on call pager [phone] until 7pm. Please call Elink 7p-7a. [phone]

## 2021-07-15 ENCOUNTER — Other Ambulatory Visit (HOSPITAL_COMMUNITY): Payer: Medicare Other

## 2021-07-15 DIAGNOSIS — Z9359 Other cystostomy status: Secondary | ICD-10-CM | POA: Diagnosis not present

## 2021-07-15 DIAGNOSIS — N1832 Chronic kidney disease, stage 3b: Secondary | ICD-10-CM | POA: Diagnosis not present

## 2021-07-15 DIAGNOSIS — I5032 Chronic diastolic (congestive) heart failure: Secondary | ICD-10-CM | POA: Diagnosis not present

## 2021-07-15 DIAGNOSIS — N139 Obstructive and reflux uropathy, unspecified: Secondary | ICD-10-CM | POA: Diagnosis not present

## 2021-07-15 MED ORDER — HYDROCODONE-ACETAMINOPHEN 10-325 MG PO TABS
1.0000 | ORAL_TABLET | Freq: Once | ORAL | Status: AC
Start: 2021-07-15 — End: 2021-07-15
  Administered 2021-07-15: 1 via ORAL
  Filled 2021-07-15: qty 1

## 2021-07-15 MED ORDER — AMOXICILLIN-POT CLAVULANATE 500-125 MG PO TABS
1.0000 | ORAL_TABLET | Freq: Two times a day (BID) | ORAL | Status: DC
  Administered 2021-07-15: 500 mg via ORAL
  Filled 2021-07-15 (×3): qty 1

## 2021-07-15 MED ORDER — AMOXICILLIN-POT CLAVULANATE 500-125 MG PO TABS: 1.0000 | ORAL_TABLET | Freq: Two times a day (BID) | ORAL | 0 refills | Status: AC

## 2021-07-15 NOTE — Progress Notes (Signed)
Patient left via wheelchair in stable condition. Went over AVS discharge instructions with the patient. Patient verbalized understanding and didn't have any further questions.

## 2021-07-15 NOTE — Discharge Summary (Signed)
Physician Discharge Summary  Jordan Ward WNU:[id number] DOB: 1941/03/15 DOA: 07/12/2021  PCP: Valentino Nose, FNP  Admit date: 07/12/2021 Discharge date: 07/15/2021  Admitted From: Home Disposition: Home   Recommendations for Outpatient Follow-up:  Follow up with PCP in 1-2 weeks with repeat BMP, CBC.  Follow up with IR for nephrostomy tube management.  Home Health: PT Equipment/Devices: None new, continue with walker Discharge Condition: Stable, improved CODE STATUS: Full Diet recommendation: Heart healthy  Brief/Interim Summary: Jordan Ward is a 80 y.o. male with a history of MS, chronic suprapubic catheter, recurrent urolithiasis now s/p left nephrostomy (May 2022), dilated cardiomyopathy, chronic HFpEF, pulmonary fibrosis, stage IIIb CKD, CAD, CVA who presented to [facility] 10/14 with fever, left flank pain, weakness, fatigue and 48 hours of minimal left nephrostomy tube output. He was hypotensive, felt to have septic shock on arrival. CT confirmed displacement of nephrostomy tube with associated severe hydronephrosis. He was admitted to [facility] ICU underwent nephrostomy tube exchange with up sizing by IR on 10/15 with subsequent weaning from vasopressors and symptomatic improvement. Renal function has returned to baseline, WBC normalized, and hemodynamics have stabilized.   Discharge Diagnoses:  Active Problems:   Chronic suprapubic catheter (HCC)   Chronic diastolic (congestive) heart failure (HCC)   CKD (chronic kidney disease), stage III (HCC)   Hydronephrosis of left kidney   Complicated UTI (urinary tract infection)   Acute unilateral obstructive uropathy   Hydronephrosis, left   Pressure ulcer, heel  Septic shock secondary to complicated UTI: Started on zosyn based on prior susceptibilities. Will complete 7 day course with augmentin at discharge.   AKI on stage IIIb CKD due to obstructive uropathy from dislodged left nephrostomy tube: s/p replacement by IR 10/15. Renal function  stabilized. Avoid nephrotoxins, f/u with IR. Postobstructive diuresis continues though is slowing considerably, pt able to maintain hydration by mouth.   COPD, pulmonary fibrosis, chronic hypoxic respiratory failure: No exacerbation noted.  - No changes in current management. Can continue 1L O2.   Chronic HFpEF, HTN: Ok to restart norvasc, appears euvolemic.   Hypokalemia: Continue home supplementation and monitor at follow up.   Hypothyroidism: Continue 33mcg dose synthroid.   RN Pressure Injury Documentation: Pressure Injury 07/13/21 Heel Left Stage 1 -  Intact skin with non-blanchable redness of a localized area usually over a bony prominence. thick callus but intact (Active)  07/13/21 0100  Location: Heel  Location Orientation: Left  Staging: Stage 1 -  Intact skin with non-blanchable redness of a localized area usually over a bony prominence.  Wound Description (Comments): thick callus but intact  Present on Admission: Yes     Pressure Injury 07/13/21 Heel Right Stage 1 -  Intact skin with non-blanchable redness of a localized area usually over a bony prominence. thick callus but intact (Active)  07/13/21 0100  Location: Heel  Location Orientation: Right  Staging: Stage 1 -  Intact skin with non-blanchable redness of a localized area usually over a bony prominence.  Wound Description (Comments): thick callus but intact  Present on Admission: Yes     Pressure Injury 07/13/21 Sacrum Mid Stage 1 -  Intact skin with non-blanchable redness of a localized area usually over a bony prominence. Reddened, non-blanchable, intact (Active)  07/13/21 0100  Location: Sacrum  Location Orientation: Mid  Staging: Stage 1 -  Intact skin with non-blanchable redness of a localized area usually over a bony prominence.  Wound Description (Comments): Reddened, non-blanchable, intact  Present on Admission: Yes  Pressure Injury 07/13/21 Buttocks Right;Lower Stage 1 -  Intact skin with  non-blanchable redness of a localized area usually over a bony prominence. scab covering center, reddness, intact (Active)  07/13/21 0100  Location: Buttocks  Location Orientation: Right;Lower  Staging: Stage 1 -  Intact skin with non-blanchable redness of a localized area usually over a bony prominence.  Wound Description (Comments): scab covering center, reddness, intact  Present on Admission: Yes     Pressure Injury 07/13/21 Buttocks Left;Lower Stage 1 -  Intact skin with non-blanchable redness of a localized area usually over a bony prominence. scab on center, reddness, intact (Active)  07/13/21 0100  Location: Buttocks  Location Orientation: Left;Lower  Staging: Stage 1 -  Intact skin with non-blanchable redness of a localized area usually over a bony prominence.  Wound Description (Comments): scab on center, reddness, intact  Present on Admission: Yes   Discharge Instructions Discharge Instructions     Diet - low sodium heart healthy   Complete by: As directed    Discharge instructions   Complete by: As directed    You were admitted for severe UTI and dislodged nephrostomy tube which has been replaced. Your vital signs have stabilized and labs have improved. You are stable for discharge with the following recommendations:  - Continue antibiotics by taking 5 more days of augmentin twice daily - Monitor urine output, if nephrostomy tube begins leaking or stops putting out urine, seek medical attention right away.  - Follow up with your PCP in the next 1-2 weeks for recheck of labs.   No wound care   Complete by: As directed       Allergies as of 07/15/2021       Reactions   Tetracyclines & Related Anaphylaxis, Rash   Ciprofloxacin    Trouble swallowing unknown reaction according to wife         Medication List     STOP taking these medications    cephALEXin 500 MG capsule Commonly known as: KEFLEX   potassium chloride 10 MEQ tablet Commonly known as: KLOR-CON        TAKE these medications    acetaminophen 325 MG tablet Commonly known as: TYLENOL Take 650 mg by mouth every 6 (six) hours as needed.   amLODipine 10 MG tablet Commonly known as: NORVASC Take 10 mg by mouth daily.   amoxicillin-clavulanate 500-125 MG tablet Commonly known as: AUGMENTIN Take 1 tablet (500 mg total) by mouth 2 (two) times daily for 5 days.   diazepam 5 MG tablet Commonly known as: VALIUM TAKE 1 TABLET EVERY 12 HOURS AS NEEDED FOR MUSCLE SPASMS   HYDROcodone-acetaminophen 7.5-325 MG tablet Commonly known as: NORCO Take 1 tablet by mouth 2 (two) times daily. Max APAP 3 GM IN 24 HOURS FROM ALL SOURCES What changed:  when to take this additional instructions   levothyroxine 75 MCG tablet Commonly known as: SYNTHROID Take 75 mcg by mouth daily. What changed: Another medication with the same name was removed. Continue taking this medication, and follow the directions you see here.   OXYGEN Inhale 1 L into the lungs every evening.   potassium chloride SA 20 MEQ tablet Commonly known as: KLOR-CON Take 20 mEq by mouth daily.        Follow-up Information     Valentino Nose, FNP. Schedule an appointment as soon as possible for a visit.   Specialty: Family Medicine Contact information: [place] [address] [phone]  Allergies  Allergen Reactions   Tetracyclines & Related Anaphylaxis and Rash   Ciprofloxacin     Trouble swallowing unknown reaction according to wife     Consultations: PCCM IR  Procedures/Studies: CT Abdomen Pelvis Wo Contrast  Result Date: 07/12/2021 CLINICAL DATA:  Left nephrostomy tube with no output. EXAM: CT ABDOMEN AND PELVIS WITHOUT CONTRAST TECHNIQUE: Multidetector CT imaging of the abdomen and pelvis was performed following the standard protocol without IV contrast. COMPARISON:  07/03/2021 FINDINGS: LOWER CHEST: Bibasilar fibrotic appearance is unchanged HEPATOBILIARY:  Normal hepatic contours. No intra- or extrahepatic biliary dilatation. There is unchanged pneumobilia. Gallbladder is surgically absent. PANCREAS: Normal pancreas. No ductal dilatation or peripancreatic fluid collection. SPLEEN: Normal. ADRENALS/URINARY TRACT: The adrenal glands are normal. Severe right renal atrophy. There is severe left hydronephrosis. There is a nephrostomy tube with its coil in the renal pelvis, but the tube has backed out slightly. The proximal metallic marker now lies near the lateral edge of the renal parenchyma, where as it was previously at the border of the renal pelvis. There is a suprapubic catheter in the bladder. STOMACH/BOWEL: There is no hiatal hernia. Normal duodenal course and caliber. Ventral abdominal hernia contains multiple loops of nondilated small bowel no focal colonic abnormality. Normal appendix. VASCULAR/LYMPHATIC: There is calcific atherosclerosis of the abdominal aorta. No lymphadenopathy. REPRODUCTIVE: Normal prostate size with symmetric seminal vesicles. MUSCULOSKELETAL. No bony spinal canal stenosis or focal osseous abnormality. OTHER: None. IMPRESSION: 1. Severe left hydronephrosis. Nephrostomy tube has backed out slightly. 2. Ventral abdominal hernia contains multiple loops of nondilated small bowel. Aortic Atherosclerosis (ICD10-I70.0). Electronically Signed   By: Ulyses Jarred M.D.   On: 07/12/2021 20:15   CT ABDOMEN PELVIS WO CONTRAST  Result Date: 07/05/2021 CLINICAL DATA:  Flank pain and recurrent urolithiasis, left kidney. EXAM: CT ABDOMEN AND PELVIS WITHOUT CONTRAST TECHNIQUE: Multidetector CT imaging of the abdomen and pelvis was performed following the standard protocol without IV contrast. COMPARISON:  CT Abdomen and Pelvis 02/13/2021; CT Abdomen and Pelvis 11/30/2020 FINDINGS: Lower chest: Extensive fibrotic interstitial lung disease identified within the lung bases. No acute abnormality. Hepatobiliary: No focal liver lesion identified. Status post  cholecystectomy. Pneumobilia is identified compatible with biliary patency which may be secondary to ERCP and sphincterotomy. Pancreas: Unremarkable. No pancreatic ductal dilatation or surrounding inflammatory changes. Spleen: Normal in size without focal abnormality. Adrenals/Urinary Tract: Normal adrenal glands. Asymmetric right renal atrophy. Inferior pole right renal calculus measures 4 mm, image 26/2. Upper pole right kidney cyst is noted measuring 1.4 cm. Right renal vascular calcifications identified. Stone within the inferior pole collecting system of the left kidney measures 1 x 0.5 cm, image 29/2. Within the central left renal pelvis there is a stone measuring 1.1 cm, image 49/5. There are 2 cysts within the upper pole of the left kidney which measure up to 3.6 cm. Since the previous exam there has been interval placement of a percutaneous nephrostomy tube with removal of internal ureteral stent. Subsequent decompression of previous left hydronephrosis identified with residual soft tissue stranding surrounding the left renal pelvis. Within the distal left ureter just below the bifurcation of the left common iliac artery there are 2 small stones identified which collectively measure 5 mm, image 51/2. Bladder is decompressed around a suprapubic catheter. There several small calcifications within the right-side of the bladder which measure up to 6 mm, image 67/2. Stomach/Bowel: Stomach appears nondistended. No bowel Garciagarcia thickening, inflammation or distension identified. Anastomotic suture line is identified within the proximal sigmoid colon, image  48/2. Vascular/Lymphatic: Extensive aortic atherosclerosis with branch vessel involvement. Prominent left retroperitoneal lymph node measures 0.9 cm, image 25/2. No iliac or inguinal adenopathy. Reproductive: Prostate is unremarkable. Other: No free fluid or fluid collections the bones appear diffusely osteopenic. Musculoskeletal: Degenerative disc disease noted at  L5-S1. No acute or suspicious osseous finding IMPRESSION: 1. Interval placement of left percutaneous nephrostomy tube with removal of internal ureteral stent. Subsequent decompression of previous left hydronephrosis identified with residual soft tissue stranding surrounding the left renal pelvis. 2. Residual stones are identified within the central left renal pelvis and inferior pole collecting system of the left kidney. Additionally, 2 small stones identified within the distal left ureter just below the bifurcation of the left common iliac artery which collectively measure 5 mm. 3. The bladder is decompressed around a suprapubic catheter. Several stones are identified within the right-side of the bladder. 4. Right renal atrophy with 4 mm inferior pole calculi. 5. Aortic Atherosclerosis (ICD10-I70.0). Electronically Signed   By: Kerby Moors M.D.   On: 07/05/2021 16:38   IR NEPHROSTOMY EXCHANGE LEFT  Result Date: 07/13/2021 INDICATION: History of neurogenic bladder ultimately post left-sided nephrostomy catheter placement. The patient was set to undergo routine fluoroscopic guided exchange early next week however presented to the emergency department with symptoms of urosepsis with CT scan demonstrating recurrent left-sided hydronephrosis despite appropriate positioning nephrostomy catheter. As such, we will proceed with fluoroscopic guided nephrostomy catheter exchange and up sizing. EXAM: FLUOROSCOPIC GUIDED LEFT SIDED NEPHROSTOMY CATHETER EXCHANGE AND UP SIZING COMPARISON:  Multiple previous fluoroscopic guided left-sided nephrostomy catheter exchanges, most recently on 05/20/2021 CT abdomen pelvis-07/12/2021; 07/03/2021 CONTRAST:  10 cc Omnipaque 300 was administered into the collecting system FLUOROSCOPY TIME:  12 seconds COMPLICATIONS: None immediate. TECHNIQUE: Informed written consent was obtained from the patient after a discussion of the risks, benefits and alternatives to treatment. Questions  regarding the procedure were encouraged and answered. A timeout was performed prior to the initiation of the procedure. The left flank and external portion of existing nephrostomy catheter were prepped and draped in the usual sterile fashion. A sterile drape was applied covering the operative field. Maximum barrier sterile technique with sterile gowns and gloves were used for the procedure. A timeout was performed prior to the initiation of the procedure. A pre procedural spot fluoroscopic image was obtained after contrast was injected via the existing nephrostomy catheter demonstrating appropriate positioning within the renal pelvis. The existing nephrostomy catheter was cut and cannulated with a Benson wire which was coiled within the renal pelvis. Under intermittent fluoroscopic guidance, the existing nephrostomy catheter was exchanged for a new slightly larger now 12 [place] all-purpose drainage catheter. Contrast injection confirmed appropriate positioning within the renal pelvis and a post exchange fluoroscopic image was obtained. The catheter was locked and secured to the skin with a suture. A dressing was placed. The patient tolerated the procedure well without immediate postprocedural complication. FINDINGS: The existing nephrostomy catheter is appropriately positioned and functioning. After successful fluoroscopic guided exchange, the new nephrostomy catheter is coiled and locked within the left renal pelvis. IMPRESSION: Successful fluoroscopic guided exchange and up sizing now 12 French left-sided percutaneous nephrostomy catheter. PLAN: At the time of the exchange, the patient's left-sided nephrostomy catheter appears to have been appropriately functioning suggesting intermittent occlusion likely due to incrustation and/or debris. As such, the patient and the patient's family should be educated regarding flushing the nephrostomy catheter if and when there is reduced output to ensure preservation of  catheter patency. Electronically Signed  By: Sandi Mariscal M.D.   On: 07/13/2021 10:41    Subjective: Feels well, pain controlled, no nausea, vomiting. Taking plenty of fluids by mouth. Amenable to DC today.   Discharge Exam: Vitals:   07/15/21 0220 07/15/21 0612  BP: (!) 145/77 127/72  Pulse: (!) 54 (!) 53  Resp: 18 20  Temp: 98.1 F (36.7 C) 98 F (36.7 C)  SpO2: 96% 99%   General: Pt is alert, awake, not in acute distress Cardiovascular: RRR, S1/S2 +, no rubs, no gallops Respiratory: CTA bilaterally, no wheezing, no rhonchi Abdominal: Soft, NT, ND, bowel sounds + Extremities: No edema, no cyanosis  Clear urine in nephrostomy bag, no discharge noted.   Labs: BNP (last 3 results) No results for input(s): BNP in the last 8760 hours. Basic Metabolic Panel: Recent Labs  Lab 07/12/21 1857 07/13/21 0242 07/13/21 1226 07/14/21 0252  NA 138 142 141 141  K 2.7* 3.0* 3.6 3.2*  CL 101 107 107 107  CO2 28 25 26 26   GLUCOSE 119* 133* 127* 94  BUN 26* 25* 20 25*  CREATININE 1.84* 1.78* 1.49* 1.78*  CALCIUM 9.0 8.6* 8.6* 8.4*  MG  --  2.0  --   --   PHOS  --  2.6  --   --    Liver Function Tests: Recent Labs  Lab 07/12/21 1857  AST 20  ALT 12  ALKPHOS 135*  BILITOT 0.7  PROT 8.0  ALBUMIN 4.1   No results for input(s): LIPASE, AMYLASE in the last 168 hours. No results for input(s): AMMONIA in the last 168 hours. CBC: Recent Labs  Lab 07/12/21 1857 07/13/21 0242 07/14/21 0252  WBC 19.3* 25.3* 10.0  NEUTROABS 17.0*  --   --   HGB 13.1 11.8* 11.2*  HCT 38.9* 35.6* 35.6*  MCV 91.3 90.4 93.9  PLT 203 195 163   Cardiac Enzymes: No results for input(s): CKTOTAL, CKMB, CKMBINDEX, TROPONINI in the last 168 hours. BNP: Invalid input(s): POCBNP CBG: Recent Labs  Lab 07/13/21 0344  GLUCAP 209*   D-Dimer No results for input(s): DDIMER in the last 72 hours. Hgb A1c No results for input(s): HGBA1C in the last 72 hours. Lipid Profile No results for input(s):  CHOL, HDL, LDLCALC, TRIG, CHOLHDL, LDLDIRECT in the last 72 hours. Thyroid function studies No results for input(s): TSH, T4TOTAL, T3FREE, THYROIDAB in the last 72 hours.  Invalid input(s): FREET3 Anemia work up No results for input(s): VITAMINB12, FOLATE, FERRITIN, TIBC, IRON, RETICCTPCT in the last 72 hours. Urinalysis    Component Value Date/Time   COLORURINE YELLOW 07/12/2021 2007   APPEARANCEUR CLOUDY (A) 07/12/2021 2007   LABSPEC 1.006 07/12/2021 2007   PHURINE 8.0 07/12/2021 2007   GLUCOSEU NEGATIVE 07/12/2021 2007   HGBUR MODERATE (A) 07/12/2021 2007   BILIRUBINUR NEGATIVE 07/12/2021 2007   [place] 07/12/2021 2007   PROTEINUR 30 (A) 07/12/2021 2007   UROBILINOGEN 0.2 07/17/2014 1215   NITRITE POSITIVE (A) 07/12/2021 2007   LEUKOCYTESUR LARGE (A) 07/12/2021 2007    Microbiology Recent Results (from the past 240 hour(s))  Culture, blood (routine x 2)     Status: None (Preliminary result)   Collection Time: 07/12/21  6:59 PM   Specimen: BLOOD RIGHT FOREARM  Result Value Ref Range Status   Specimen Description BLOOD RIGHT FOREARM  Final   Special Requests   Final    BOTTLES DRAWN AEROBIC AND ANAEROBIC Blood Culture adequate volume   Culture   Final    NO GROWTH 3  DAYS Performed at [facility], [address] [place], [place] [address]    Report Status PENDING  Incomplete  Resp Panel by RT-PCR (Flu A&B, Covid) Nasopharyngeal Swab     Status: None   Collection Time: 07/12/21  7:02 PM   Specimen: Nasopharyngeal Swab; Nasopharyngeal(NP) swabs in vial transport medium  Result Value Ref Range Status   SARS Coronavirus 2 by RT PCR NEGATIVE NEGATIVE Final    Comment: (NOTE) SARS-CoV-2 target nucleic acids are NOT DETECTED.  The SARS-CoV-2 RNA is generally detectable in upper respiratory specimens during the acute phase of infection. The lowest concentration of SARS-CoV-2 viral copies this assay can detect is 138 copies/mL. A negative result does not preclude  SARS-Cov-2 infection and should not be used as the sole basis for treatment or other patient management decisions. A negative result may occur with  improper specimen collection/handling, submission of specimen other than nasopharyngeal swab, presence of viral mutation(s) within the areas targeted by this assay, and inadequate number of viral copies(<138 copies/mL). A negative result must be combined with clinical observations, patient history, and epidemiological information. The expected result is Negative.  Fact Sheet for Patients:  EntrepreneurPulse.com.au  Fact Sheet for Healthcare Providers:  IncredibleEmployment.be  This test is no t yet approved or cleared by the [place] FDA and  has been authorized for detection and/or diagnosis of SARS-CoV-2 by FDA under an Emergency Use Authorization (EUA). This EUA will remain  in effect (meaning this test can be used) for the duration of the COVID-19 declaration under Section 564(b)(1) of the Act, 21 U.S.C.section 360bbb-3(b)(1), unless the authorization is terminated  or revoked sooner.       Influenza A by PCR NEGATIVE NEGATIVE Final   Influenza B by PCR NEGATIVE NEGATIVE Final    Comment: (NOTE) The Xpert Xpress SARS-CoV-2/FLU/RSV plus assay is intended as an aid in the diagnosis of influenza from Nasopharyngeal swab specimens and should not be used as a sole basis for treatment. Nasal washings and aspirates are unacceptable for Xpert Xpress SARS-CoV-2/FLU/RSV testing.  Fact Sheet for Patients: EntrepreneurPulse.com.au  Fact Sheet for Healthcare Providers: IncredibleEmployment.be  This test is not yet approved or cleared by the [place] FDA and has been authorized for detection and/or diagnosis of SARS-CoV-2 by FDA under an Emergency Use Authorization (EUA). This EUA will remain in effect (meaning this test can be used) for the duration of  the COVID-19 declaration under Section 564(b)(1) of the Act, 21 U.S.C. section 360bbb-3(b)(1), unless the authorization is terminated or revoked.  Performed at [facility], [address] [place], [place] [address]   Culture, blood (routine x 2)     Status: None (Preliminary result)   Collection Time: 07/12/21  7:45 PM   Specimen: Right Antecubital; Blood  Result Value Ref Range Status   Specimen Description RIGHT ANTECUBITAL  Final   Special Requests   Final    BOTTLES DRAWN AEROBIC ONLY Blood Culture adequate volume   Culture   Final    NO GROWTH 3 DAYS Performed at [facility], [address], [place], [place] [address]    Report Status PENDING  Incomplete  Urine Culture     Status: Abnormal   Collection Time: 07/12/21  8:07 PM   Specimen: Urine, Catheterized  Result Value Ref Range Status   Specimen Description   Final    URINE, CATHETERIZED Performed at [facility], [address], [place], [place] [address]    Special Requests   Final    NONE  Performed at [facility], [address], [place], [place] [address]    Culture MULTIPLE SPECIES PRESENT, SUGGEST RECOLLECTION (A)  Final   Report Status 07/14/2021 FINAL  Final  MRSA Next Gen by PCR, Nasal     Status: None   Collection Time: 07/13/21  6:50 AM   Specimen: Nasal Mucosa; Nasal Swab  Result Value Ref Range Status   MRSA by PCR Next Gen NOT DETECTED NOT DETECTED Final    Comment: (NOTE) The GeneXpert MRSA Assay (FDA approved for NASAL specimens only), is one component of a comprehensive MRSA colonization surveillance program. It is not intended to diagnose MRSA infection nor to guide or monitor treatment for MRSA infections. Test performance is not FDA approved in patients less than 16 years old. Performed at [facility], [place] [address] [place], [place] [address]     Time coordinating discharge: Approximately 40 minutes  Patrecia Pour, MD  [facility] 07/15/2021, 1:52 PM

## 2021-07-15 NOTE — Care Management Important Message (Signed)
Important Message  Patient Details IM Letter given to the Patient. Name: Jordan Ward MRN: [id number] Date of Birth: 09-Feb-1941   Medicare Important Message Given:  Yes     Kerin Salen 07/15/2021, 1:41 PM

## 2021-07-15 NOTE — TOC Transition Note (Signed)
Transition of Care [facility]) - CM/SW Discharge Note   Patient Details  Name: Jordan Ward MRN: [id number] Date of Birth: [birth year]  [facility]) CM/SW Contact:  Dessa Phi, RN Phone Number: 07/15/2021, 9:54 AM   Clinical Narrative:   No CM needs or orders.          Patient Goals and CMS Choice        Discharge Placement                       Discharge Plan and Services                                     Social Determinants of Health (SDOH) Interventions     Readmission Risk Interventions Readmission Risk Prevention Plan 07/15/2021 02/15/2021 09/06/2020  Transportation Screening - - Complete  PCP or Specialist Appt within 5-7 Days - Complete -  PCP or Specialist Appt within 3-5 Days Complete - -  Home Care Screening - Complete -  Medication Review (RN CM) - Complete -  HRI or Home Care Consult Complete - Complete  Social Work Consult for [place] Planning/Counseling Complete - Complete  Palliative Care Screening Complete - Not Applicable  Medication Review Press photographer) Complete - Complete  Some recent data might be hidden

## 2021-07-16 DIAGNOSIS — J9611 Chronic respiratory failure with hypoxia: Secondary | ICD-10-CM | POA: Diagnosis not present

## 2021-07-16 DIAGNOSIS — I69354 Hemiplegia and hemiparesis following cerebral infarction affecting left non-dominant side: Secondary | ICD-10-CM | POA: Diagnosis not present

## 2021-07-16 DIAGNOSIS — N1330 Unspecified hydronephrosis: Secondary | ICD-10-CM | POA: Diagnosis not present

## 2021-07-16 DIAGNOSIS — J8410 Pulmonary fibrosis, unspecified: Secondary | ICD-10-CM | POA: Diagnosis not present

## 2021-07-16 DIAGNOSIS — I130 Hypertensive heart and chronic kidney disease with heart failure and stage 1 through stage 4 chronic kidney disease, or unspecified chronic kidney disease: Secondary | ICD-10-CM | POA: Diagnosis not present

## 2021-07-16 DIAGNOSIS — Z9981 Dependence on supplemental oxygen: Secondary | ICD-10-CM | POA: Diagnosis not present

## 2021-07-16 DIAGNOSIS — G4733 Obstructive sleep apnea (adult) (pediatric): Secondary | ICD-10-CM | POA: Diagnosis not present

## 2021-07-16 DIAGNOSIS — E876 Hypokalemia: Secondary | ICD-10-CM | POA: Diagnosis not present

## 2021-07-16 DIAGNOSIS — R1310 Dysphagia, unspecified: Secondary | ICD-10-CM | POA: Diagnosis not present

## 2021-07-16 DIAGNOSIS — D631 Anemia in chronic kidney disease: Secondary | ICD-10-CM | POA: Diagnosis not present

## 2021-07-16 DIAGNOSIS — I739 Peripheral vascular disease, unspecified: Secondary | ICD-10-CM | POA: Diagnosis not present

## 2021-07-16 DIAGNOSIS — R6521 Severe sepsis with septic shock: Secondary | ICD-10-CM | POA: Diagnosis not present

## 2021-07-16 DIAGNOSIS — N136 Pyonephrosis: Secondary | ICD-10-CM | POA: Diagnosis not present

## 2021-07-16 DIAGNOSIS — A419 Sepsis, unspecified organism: Secondary | ICD-10-CM | POA: Diagnosis not present

## 2021-07-16 DIAGNOSIS — N1832 Chronic kidney disease, stage 3b: Secondary | ICD-10-CM | POA: Diagnosis not present

## 2021-07-16 DIAGNOSIS — I5032 Chronic diastolic (congestive) heart failure: Secondary | ICD-10-CM | POA: Diagnosis not present

## 2021-07-16 DIAGNOSIS — J449 Chronic obstructive pulmonary disease, unspecified: Secondary | ICD-10-CM | POA: Diagnosis not present

## 2021-07-16 DIAGNOSIS — Z79891 Long term (current) use of opiate analgesic: Secondary | ICD-10-CM | POA: Diagnosis not present

## 2021-07-16 DIAGNOSIS — I2510 Atherosclerotic heart disease of native coronary artery without angina pectoris: Secondary | ICD-10-CM | POA: Diagnosis not present

## 2021-07-16 DIAGNOSIS — E785 Hyperlipidemia, unspecified: Secondary | ICD-10-CM | POA: Diagnosis not present

## 2021-07-16 DIAGNOSIS — I420 Dilated cardiomyopathy: Secondary | ICD-10-CM | POA: Diagnosis not present

## 2021-07-16 DIAGNOSIS — Z9181 History of falling: Secondary | ICD-10-CM | POA: Diagnosis not present

## 2021-07-16 DIAGNOSIS — G35 Multiple sclerosis: Secondary | ICD-10-CM | POA: Diagnosis not present

## 2021-07-16 DIAGNOSIS — N179 Acute kidney failure, unspecified: Secondary | ICD-10-CM | POA: Diagnosis not present

## 2021-07-16 DIAGNOSIS — G8929 Other chronic pain: Secondary | ICD-10-CM | POA: Diagnosis not present

## 2021-07-16 DIAGNOSIS — E039 Hypothyroidism, unspecified: Secondary | ICD-10-CM | POA: Diagnosis not present

## 2021-07-16 MED FILL — Fentanyl Citrate Preservative Free (PF) Inj 100 MCG/2ML: INTRAMUSCULAR | Qty: 2 | Status: AC

## 2021-07-17 LAB — CULTURE, BLOOD (ROUTINE X 2)
Culture: NO GROWTH
Culture: NO GROWTH
Special Requests: ADEQUATE
Special Requests: ADEQUATE

## 2021-07-17 NOTE — ED Provider Notes (Signed)
[facility] PROGRESSIVE CARE AND UROLOGY Provider Note   CSN: [id number] Arrival date & time: 07/12/21  1714     History Chief Complaint  Patient presents with   Fever    Jordan Ward is a 80 y.o. male.  Patient presents to the ER chief complaint of left flank pain ongoing for 1 day now.  Pain is described as sharp and severe nonradiating.  He has a urostomy tube in place which he feels is not hurting him again is concerned it may be out of place.  Otherwise denies fevers or cough no vomiting or diarrhea.      Past Medical History:  Diagnosis Date   Anemia    Arthritis    Back pain, chronic    Bilateral carotid bruits    C. difficile colitis 09/2011   CAD (coronary artery disease)    Carotid artery stenosis    Cerebrovascular disease    Cerebrovascular disease 08/17/2018   Colon polyps    Complication of cystostomy catheter, initial encounter ([place]) 04/20/2020   Dyslipidemia    Dysphagia 10/07/2011   Encephalopathy    Gait disorder    HA (headache)    High grade dysplasia in colonic adenoma 09/2005   History of kidney stones    HTN (hypertension), malignant 10/06/2011   Hypernatremia    Hypokalemia    Hypothyroidism 10/08/2011   Insomnia    Junctional rhythm    Kidney stones    MS (multiple sclerosis) (HCC)    Neuromuscular disorder (HCC)    MS   OSA (obstructive sleep apnea)    Paroxysmal atrial tachycardia ([place])    Peripheral vascular disease ([place])    Pneumonia 4 yrs ago   Pulmonary fibrosis ([place]) 10/06/2011   Pulmonary nodule 10/08/2011   PVD (peripheral vascular disease) ([place])    Sacral ulcer (HCC)    Sleep apnea    cannot tolerate   Stroke ([place])    left sided weakness   Suprapubic catheter ([place])    TIA (transient ischemic attack)    Tremors of nervous system 10/08/2011   Urinary tract infection    Ventral hernia without obstruction or gangrene     Large 8X9cm ventral hernia with loss of domain. CT reads report as diastasis recti with herniation or  diastasis recti.  Dr. Constance Haw, Surgery, reviewed CT with radiology and there is herniation with only hernia sac or peritoneum over the bowel and large separation of the rectus muslce (i.e. diastasis recti aka loss of domain).  No surgical intervention recommended given size, age, and health.     Patient Active Problem List   Diagnosis Date Noted   Pressure ulcer, heel 07/14/2021   Rigors 03/07/2021   Hydronephrosis, left 02/13/2021   Encounter for removal of vascular catheter 09/14/2020   Acute respiratory failure with hypoxia ([place]) 07/12/2020   Pneumonia due to COVID-19 virus 07/12/2020   Encephalopathy due to COVID-19 virus 07/12/2020   Acute hypoxemic respiratory failure due to COVID-19 ([place]) 07/03/2020   Calculus of ureter 05/15/2020   Bacteremia due to Gram-negative bacteria 05/04/2020   Acute lower UTI 05/04/2020   Acute unilateral obstructive uropathy    SIRS (systemic inflammatory response syndrome) ([place]) 05/03/2020   Hydronephrosis 41/28/7867   Complication of cystostomy catheter, subsequent encounter 04/20/2020   Acute kidney injury superimposed on CKD ([place]) 03/02/2020   Dehydration 03/02/2020   Nausea and vomiting 03/02/2020   Ventral hernia without obstruction or gangrene 03/01/2020   Hypotension 03/01/2020   Poor appetite  03/01/2020   Urinary tract infection 03/01/2020   [place] areflexia 09/16/2019   Cerebrovascular disease 08/17/2018   Partial small bowel obstruction ([place]) 07/11/2017   Choledocholithiasis 19/37/9024   Complicated UTI (urinary tract infection)    Polymicrobial septicemia (HCC) 06/14/2017   Paroxysmal atrial tachycardia (HCC) 06/14/2017   Hypophosphatemia 06/14/2017   Pressure injury of skin 05/26/2017   Hydronephrosis due to obstruction of ureter 05/26/2017   Obstructive uropathy 05/25/2017   CKD (chronic kidney disease), stage III ([place]) 08/23/2016   Hydronephrosis of left kidney 08/23/2016   Kidney stones 09/13/2015   Essential hypertension     Pressure ulcer 08/04/2015   Chronic diastolic (congestive) heart failure ([place]) 08/04/2015   Hydronephrosis with obstructing calculus 08/04/2015   Occult blood positive stool 09/05/2014   Rash and nonspecific skin eruption 08/25/2014   Edema 08/19/2014   Subacute confusional state 08/06/2014   Dilated cardiomyopathy ([place]) 07/21/2014   Sepsis ([place]) 07/17/2014   Severe sepsis ([place]) 07/17/2014   Headache 03/13/2014   CVA (cerebral infarction) 03/13/2014   Abnormality of gait 03/13/2014   Tremors of nervous system 10/08/2011   Hypothyroidism 10/08/2011   Pulmonary nodule 10/08/2011   Dysphagia 10/07/2011   HTN (hypertension), malignant 10/06/2011   Chronic suprapubic catheter ([place]) 10/06/2011   Pulmonary fibrosis ([place]) 10/06/2011   UTI (urinary tract infection) 10/03/2011   PNA (pneumonia) 10/02/2011   Junctional rhythm 10/02/2011   Multiple sclerosis ([place]) 10/02/2011   Chronic diarrhea 06/17/2011   Hx of adenomatous colonic polyps 06/17/2011   High grade dysplasia in colonic adenoma 09/29/2005    Past Surgical History:  Procedure Laterality Date   APPENDECTOMY  09/2005   at time of left hemicolectomy   BACK SURGERY  1976/1979   lower   BILIARY DILATION N/A 03/03/2020   Procedure: BILIARY DILATION;  Surgeon: Rogene Houston, MD;  [facility];  Service: Endoscopy;  Laterality: N/A;   CATARACT EXTRACTION W/PHACO Right 03/08/2018   Procedure: CATARACT EXTRACTION PHACO AND INTRAOCULAR LENS PLACEMENT RIGHT EYE;  Surgeon: Tonny Branch, MD;  Location: [facility] ORS;  Service: Ophthalmology;  Laterality: Right;  CDE: 8.86   CATARACT EXTRACTION W/PHACO Left 04/05/2018   Procedure: CATARACT EXTRACTION PHACO AND INTRAOCULAR LENS PLACEMENT (IOC);  Surgeon: Tonny Branch, MD;  Location: [facility];  Service: Ophthalmology;  Laterality: Left;  CDE: 7.36   CENTRAL LINE INSERTION-TUNNELED Right 09/11/2020   Procedure: PLACEMENT OF TUNNELED CENTRAL LINE INTO JUGULAR VEIN;  Surgeon: Virl Cagey, MD;  Location: [facility];  Service: General;  Laterality: Right;   CHOLECYSTECTOMY     Dr. Tamala Julian   COLON SURGERY  09/2005   Fleishman: four tubular adenomas, large adenomatous polyp with HIGH GRADE dysplasia   COLONOSCOPY  11/2004   Dr. Sharol Roussel sessile polyp splenic flexure, 28mm sessile polyp desc colon, tubulovillous adenoma (bx not removed)   COLONOSCOPY  01/2005   poor prep, polyp could not be found   COLONOSCOPY  05/2005   with EMR, polypectomy Dr. Olegario Messier, bx showed high grade dysplasia, partially resected   COLONOSCOPY  09/2005   Dr. Arsenio Loader, [place] ink tattooing, four villous colon polyp (3 had been missed on previous colonoscopies due to limitations of procedures   COLONOSCOPY  09/2006   normal TI, no polyps   COLONOSCOPY  10/2007   Dr. Imogene Burn distal mammillations, benign bx, normal TI, random bx neg for microscopic colitis   CYSTOSCOPY W/ URETERAL STENT PLACEMENT Left 09/05/2020   Procedure: CYSTOSCOPY WITH RETROGRADE PYELOGRAM/URETERAL STENT PLACEMENT;  Surgeon: Louis Meckel  W, MD;  Location: [facility];  Service: Urology;  Laterality: Left;   CYSTOSCOPY WITH LITHOLAPAXY N/A 07/27/2018   Procedure: CYSTOSCOPY WITH LITHOLAPAXY VIA  SUPRAPUBIC TUBE;  Surgeon: Franchot Gallo, MD;  Location: [facility];  Service: Urology;  Laterality: N/A;   CYSTOSCOPY WITH RETROGRADE PYELOGRAM, URETEROSCOPY AND STENT PLACEMENT Left 06/09/2017   Procedure: CYSTOSCOPY WITH LEFT RETROGRADE PYELOGRAM, LEFT URETEROSCOPY, LEFT URETEROSCOPIC STONE EXTRACTION, LEFT URETERAL STENT PLACEMENT;  Surgeon: Franchot Gallo, MD;  Location: [facility];  Service: Urology;  Laterality: Left;   CYSTOSCOPY/URETEROSCOPY/HOLMIUM LASER/STENT PLACEMENT Left 05/22/2020   Procedure: CYSTOSCOPY/URETEROSCOPY/STENT PLACEMENT;  Surgeon: Franchot Gallo, MD;  Location: [facility];  Service: Urology;  Laterality: Left;   ERCP N/A 03/03/2020   Procedure: ENDOSCOPIC RETROGRADE CHOLANGIOPANCREATOGRAPHY (ERCP);  Surgeon: Rogene Houston, MD;  [facility];  Service: Endoscopy;  Laterality: N/A;   ERCP N/A 04/20/2020   Procedure: ENDOSCOPIC RETROGRADE CHOLANGIOPANCREATOGRAPHY (ERCP);  Surgeon: Rogene Houston, MD;  [facility];  Service: Endoscopy;  Laterality: N/A;  to be done at 7:30am in [place] N/A 04/20/2020   Procedure: STENT REMOVAL;  Surgeon: Rogene Houston, MD;  [facility];  Service: Endoscopy;  Laterality: N/A;   INGUINAL HERNIA REPAIR  1971   bilateral   INSERTION OF SUPRAPUBIC CATHETER  06/09/2017   Procedure: EXCHANGE OF SUPRAPUBIC CATHETER;  Surgeon: Franchot Gallo, MD;  Location: [facility];  Service: Urology;;   INSERTION OF SUPRAPUBIC CATHETER  05/22/2020   Procedure: SUPRAPUBIC CATHETER EXCHANGE;  Surgeon: Franchot Gallo, MD;  Location: [facility];  Service: Urology;;   IR NEPHROSTOMY EXCHANGE LEFT  03/25/2021   IR NEPHROSTOMY EXCHANGE LEFT  05/20/2021   IR NEPHROSTOMY EXCHANGE LEFT  07/13/2021   IR NEPHROSTOMY PLACEMENT LEFT  05/26/2017   IR NEPHROSTOMY PLACEMENT LEFT  05/02/2020   IR NEPHROSTOMY PLACEMENT LEFT  02/15/2021   IR URETERAL STENT PERC REMOVAL MOD SED  03/25/2021   KIDNEY STONE SURGERY  09/13/2015   LITHOTRIPSY N/A 03/03/2020   Procedure: MECHANICAL LITHOTRIPSY WITH REMOVAL OF MULTIPLE STONE FRAGMENTS;  Surgeon: Rogene Houston, MD;  [facility];  Service: Endoscopy;  Laterality: N/A;   NEPHROLITHOTOMY Left 09/13/2015   Procedure: LEFT PERCUTANEOUS NEPHROLITHOTOMY ;  Surgeon: Franchot Gallo, MD;  Location: [facility];  Service: Urology;  Laterality: Left;   NEPHROSTOMY TUBE REMOVAL  05/22/2020   Procedure: NEPHROSTOMY TUBE REMOVAL;  Surgeon: Franchot Gallo, MD;  Location: [facility];  Service: Urology;;   [facility] REMOVAL Right 09/20/2020   Procedure: MINOR REMOVAL CENTRAL LINE;  Surgeon: Virl Cagey, MD;  Location: [facility];  Service: General;  Laterality: Right;  Pt to arrive at 7:30am for procedure   REMOVAL  OF STONES N/A 04/20/2020   Procedure: REMOVAL OF STONES;  Surgeon: Rogene Houston, MD;  [facility];  Service: Endoscopy;  Laterality: N/A;   SPHINCTEROTOMY  03/03/2020   Procedure: BILLARY SPHINCTEROTOMY;  Surgeon: Rogene Houston, MD;  [facility];  Service: Endoscopy;;   SUPRAPUBIC CATHETER INSERTION         Family History  Problem Relation Age of Onset   Cirrhosis Brother        etoh   Stroke Mother 9   Coronary artery disease Father 50   Heart attack Brother    Cancer Sister    Multiple sclerosis Other    Colon cancer Neg Hx     Social History   Tobacco Use   Smoking status:  Former    Packs/day: 1.00    Years: 25.00    Pack years: 25.00    Types: Cigarettes    Quit date: 03/03/1988    Years since quitting: 33.3   Smokeless tobacco: Never  Vaping Use   Vaping Use: Never used  Substance Use Topics   Alcohol use: No   Drug use: No    Home Medications Prior to Admission medications   Medication Sig Start Date End Date Taking? Authorizing Provider  acetaminophen (TYLENOL) 325 MG tablet Take 650 mg by mouth every 6 (six) hours as needed.   Yes [provider]  amLODipine (NORVASC) 10 MG tablet Take 10 mg by mouth daily. 05/21/21  Yes [provider]  diazepam (VALIUM) 5 MG tablet TAKE 1 TABLET EVERY 12 HOURS AS NEEDED FOR MUSCLE SPASMS 07/08/21  Yes Kathrynn Ducking, MD  HYDROcodone-acetaminophen (NORCO) 7.5-325 MG per tablet Take 1 tablet by mouth 2 (two) times daily. Max APAP 3 GM IN 24 HOURS FROM ALL SOURCES Patient taking differently: Take 1 tablet by mouth in the morning and at bedtime. ALL MEDICATIONS TO BE CRUSHED AND PLACED IN APPLESAUCE 08/22/14  Yes Pandey, Mahima, MD  levothyroxine (SYNTHROID) 75 MCG tablet Take 75 mcg by mouth daily. 07/02/21  Yes [provider]  OXYGEN Inhale 1 L into the lungs every evening.   Yes [provider]  potassium chloride SA (KLOR-CON) 20 MEQ tablet Take 20 mEq by mouth  daily. 07/02/21  Yes [provider]  amoxicillin-clavulanate (AUGMENTIN) 500-125 MG tablet Take 1 tablet (500 mg total) by mouth 2 (two) times daily for 5 days. 07/15/21 07/20/21  Patrecia Pour, MD    Allergies    Tetracyclines & related and Ciprofloxacin  Review of Systems   Review of Systems  Constitutional:  Negative for fever.  HENT:  Negative for ear pain and sore throat.   Eyes:  Negative for pain.  Respiratory:  Negative for cough.   Cardiovascular:  Negative for chest pain.  Gastrointestinal:  Negative for abdominal pain.  Genitourinary:  Positive for flank pain.  Musculoskeletal:  Negative for back pain.  Skin:  Negative for color change and rash.  Neurological:  Negative for syncope.  All other systems reviewed and are negative.  Physical Exam Updated Vital Signs BP 127/72 (BP Location: Right Arm)   Pulse (!) 53   Temp 98 F (36.7 C) (Oral)   Resp 20   Wt 50.8 kg   SpO2 99%   BMI 18.08 kg/m   Physical Exam Constitutional:      Appearance: He is well-developed.  HENT:     Head: Normocephalic.     Nose: Nose normal.  Eyes:     Extraocular Movements: Extraocular movements intact.  Cardiovascular:     Rate and Rhythm: Normal rate.  Pulmonary:     Effort: Pulmonary effort is normal.  Skin:    Coloration: Skin is not jaundiced.  Neurological:     Mental Status: He is alert. Mental status is at baseline.    ED Results / Procedures / Treatments   Labs (all labs ordered are listed, but only abnormal results are displayed) Labs Reviewed  URINE CULTURE - Abnormal; Notable for the following components:      Result Value   Culture MULTIPLE SPECIES PRESENT, SUGGEST RECOLLECTION (*)    All other components within normal limits  CBC WITH DIFFERENTIAL/PLATELET - Abnormal; Notable for the following components:   WBC 19.3 (*)    HCT 38.9 (*)  Neutro Abs 17.0 (*)    All other components within normal limits  COMPREHENSIVE METABOLIC PANEL - Abnormal;  Notable for the following components:   Potassium 2.7 (*)    Glucose, Bld 119 (*)    BUN 26 (*)    Creatinine, Ser 1.84 (*)    Alkaline Phosphatase 135 (*)    GFR, Estimated 37 (*)    All other components within normal limits  URINALYSIS, ROUTINE W REFLEX MICROSCOPIC - Abnormal; Notable for the following components:   APPearance CLOUDY (*)    Hgb urine dipstick MODERATE (*)    Protein, ur 30 (*)    Nitrite POSITIVE (*)    Leukocytes,Ua LARGE (*)    Bacteria, UA MANY (*)    All other components within normal limits  CBC - Abnormal; Notable for the following components:   WBC 25.3 (*)    RBC 3.94 (*)    Hemoglobin 11.8 (*)    HCT 35.6 (*)    All other components within normal limits  BASIC METABOLIC PANEL - Abnormal; Notable for the following components:   Potassium 3.0 (*)    Glucose, Bld 133 (*)    BUN 25 (*)    Creatinine, Ser 1.78 (*)    Calcium 8.6 (*)    GFR, Estimated 39 (*)    All other components within normal limits  GLUCOSE, CAPILLARY - Abnormal; Notable for the following components:   Glucose-Capillary 209 (*)    All other components within normal limits  BASIC METABOLIC PANEL - Abnormal; Notable for the following components:   Glucose, Bld 127 (*)    Creatinine, Ser 1.49 (*)    Calcium 8.6 (*)    GFR, Estimated 48 (*)    All other components within normal limits  CBC - Abnormal; Notable for the following components:   RBC 3.79 (*)    Hemoglobin 11.2 (*)    HCT 35.6 (*)    All other components within normal limits  BASIC METABOLIC PANEL - Abnormal; Notable for the following components:   Potassium 3.2 (*)    BUN 25 (*)    Creatinine, Ser 1.78 (*)    Calcium 8.4 (*)    GFR, Estimated 39 (*)    All other components within normal limits  CULTURE, BLOOD (ROUTINE X 2)  CULTURE, BLOOD (ROUTINE X 2)  RESP PANEL BY RT-PCR (FLU A&B, COVID) ARPGX2  MRSA NEXT GEN BY PCR, NASAL  LACTIC ACID, PLASMA  LACTIC ACID, PLASMA  MAGNESIUM  PHOSPHORUS     EKG None  Radiology No results found.  Procedures Procedures   Medications Ordered in ED Medications  potassium chloride 10 mEq in 100 mL IVPB (10 mEq Intravenous Not Given 07/13/21 0000)  sodium chloride 0.9 % bolus 1,000 mL (0 mLs Intravenous Stopped 07/12/21 2102)  morphine 2 MG/ML injection 2 mg (2 mg Intravenous Given 07/12/21 2251)  acetaminophen (TYLENOL) tablet 650 mg (650 mg Oral Given 07/12/21 1938)  potassium chloride (KLOR-CON) packet 40 mEq (40 mEq Oral Given by Other 07/12/21 2004)  ibuprofen (ADVIL) tablet 600 mg (600 mg Oral Given 07/12/21 2109)  vancomycin (VANCOREADY) IVPB 750 mg/150 mL (0 mg Intravenous Stopped 07/12/21 2250)  potassium chloride SA (KLOR-CON) CR tablet 20 mEq (20 mEq Oral Given 07/13/21 0309)  lactated ringers bolus 1,000 mL ( Intravenous Infusion Verify 07/13/21 0400)  potassium chloride 10 mEq in 100 mL IVPB (0 mEq Intravenous Stopped 07/13/21 0700)  potassium chloride 10 mEq in 100 mL IVPB (0 mEq Intravenous Stopped  07/13/21 1040)  lidocaine (XYLOCAINE) 1 % (with pres) injection (2 mLs Subcutaneous Given 07/13/21 1018)  iohexol (OMNIPAQUE) 300 MG/ML solution 25 mL (10 mLs Per Tube Contrast Given 07/13/21 1014)  lactated ringers bolus 1,000 mL ( Intravenous Infusion Verify 07/13/21 1520)  potassium chloride (KLOR-CON) packet 60 mEq (60 mEq Oral Given 07/14/21 0947)  HYDROcodone-acetaminophen (NORCO) 10-325 MG per tablet 1 tablet (1 tablet Oral Given 07/15/21 0349)    ED Course  I have reviewed the triage vital signs and the nursing notes.  Pertinent labs & imaging results that were available during my care of the patient were reviewed by me and considered in my medical decision making (see chart for details).    MDM Rules/Calculators/A&P                           Patient presents with nephrostomy tube appears out of place.  Case discussed with urology, recommending admission to medicine with IR consultation for replacement of urostomy  tube.  Patient has a positive urinary tract infection as well, started on IV antibiotics.  Final Clinical Impression(s) / ED Diagnoses Final diagnoses:  Complicated UTI (urinary tract infection)  Complication of urostomy ([place])    Rx / DC Orders ED Discharge Orders          Ordered    amoxicillin-clavulanate (AUGMENTIN) 500-125 MG tablet  2 times daily        07/15/21 1352    Diet - low sodium heart healthy        07/15/21 1352    No wound care        07/15/21 1352    Discharge instructions       Comments: You were admitted for severe UTI and dislodged nephrostomy tube which has been replaced. Your vital signs have stabilized and labs have improved. You are stable for discharge with the following recommendations:  - Continue antibiotics by taking 5 more days of augmentin twice daily - Monitor urine output, if nephrostomy tube begins leaking or stops putting out urine, seek medical attention right away.  - Follow up with your PCP in the next 1-2 weeks for recheck of labs.   07/15/21 1352             Luna Fuse, MD 07/17/21 [phone]

## 2021-07-18 ENCOUNTER — Ambulatory Visit (INDEPENDENT_AMBULATORY_CARE_PROVIDER_SITE_OTHER): Payer: Medicare Other

## 2021-07-18 ENCOUNTER — Other Ambulatory Visit: Payer: Self-pay

## 2021-07-18 DIAGNOSIS — R339 Retention of urine, unspecified: Secondary | ICD-10-CM

## 2021-07-18 NOTE — Patient Instructions (Signed)
Foley Catheter Care and Patient Education  Perform catheter care every day.  You can do this while in the shower, but NOT while taking a tub bath.  You will need the following supplies: -mild soap, such as Dove -water -a clean washcloth (not one already used for bathing) or a 4x4 piece of gauze -1 Cath-Secure -night drainage bag -2 alcohol swabs  Was you hands thoroughly with soap and water Using mild soap and water, clan your genital area Men should retract the foreskin, if needed, and clean the area, including the penis Women should separate the labia, and clean the area from front to back  3. Clean your urinary opening, which is where the catheter enters your body. 4. Clean the catheter from where it enters your body and then down, away from your body.  Hold the catheter at the point it enters your body so that you don't put tension on it. 5. Rinse the area well and dry it gently.  Changing the drainage bag You will change your drainage bag twice a day -in the morning after you shower, change he night bag to the leg bag -at night before you go to bed, change the leg bag to the night bag  Wash your hands thoroughly with soap and water Empty the urine from the drainage bag into the toilet before you change it Pinch off the catheter with your fingers and disconnect the used bag Wipe the end of the catheter using an alcohol pad Wipe the connector on the bag using the second alcohol pad Connect the clean bag to the catheter and release your finger pinch Check all connections.  Straighten any kinks or twits in the tubing  Caring for the Leg bag -always wear the leg bag below your knee.  This will help it drain. -keep the leg bag secure with the velcro straps.  If the straps leave a mark on your leg they are to tight and should be loosened.  Leaving the straps too tight can decrease you circulation and lead to blood clots. -empty the leg bag through the spout at the bottom every  2-4 hours as needed.  Do not let the bag become completely full. -do not lie down for longer than 2 hours while you are wearing the leg bag.  Caring for the Night Bag -always keep the night bag below the level of your bladder . -to hang your night bag while you sleep, place a clean plastic bag inside of a wastebasket.  Hang the night bag on the inside of the wastebasket.  Cleaning the Drainage bag Wash you hands thoroughly with soap and water. Rinse the equipment with cool water.  Do not use hot water it can damage the plastic equipment. Was the equipment with a mild liquid detergent (ivory) and rinse with cool water. To decrease odor, fill the bag halfway with a mixture of 1 part vinegar and 3 parts water. Shake the bag and let it sit for 15 minutes. Rinse the bag with cool water and hang it up to dry.  Preventing infection -keep the drainage bag below the level of your bladder and off the floor at all times. -keep the catheter secured to your thigh to prevent it from moving. -do not lie on or block the flow of urine in the tubing. -shower daily to keep the catheter clean. -clean your hands before and after touching the catheter or bag. -the spout of the drainage bag should never touch the side of   the toilet or any emptying container.  Special Points -You may see some blood or urine around where the catheter enters your body, especially when walking or having a bowel movement.  This is normal, as long as there is urine draining into the drainage bag.  If you experience significant leakage around  catheter tube where it enters your urethra possibly associated with lower abdominal cramping you could be having a bladder spasm.  Please notify your doctor and we can prescribe you a medication to improve your symptoms. -drink 1-2 glasses of liquids every 2 hours while you're awake.  Call your doctor immediately if -your catheter comes out, do not try to replace it yourself -you have  temperature of 101F (38.8C) or higher -you have bright red blood or large blood clots in your urine -you have abdominal pain and no urine in your catheter bag   

## 2021-07-18 NOTE — Progress Notes (Signed)
Suprapubic Cath Change ° °Patient is present today for a suprapubic catheter change due to urinary retention.  10ml of water was drained from the balloon, a 24FR foley cath was removed from the tract with out difficulty.  Site was cleaned and prepped in a sterile fashion with betadine.  A 24FR foley cath was replaced into the tract no complications were noted. Urine return was noted, 10 ml of sterile water was inflated into the balloon and a leg bag was attached for drainage.  Patient tolerated well.    ° °Performed by: Anaisa Radi LPN ° °Follow up: Keep next scheduled NV °

## 2021-07-22 DIAGNOSIS — N136 Pyonephrosis: Secondary | ICD-10-CM | POA: Diagnosis not present

## 2021-07-22 DIAGNOSIS — N1832 Chronic kidney disease, stage 3b: Secondary | ICD-10-CM | POA: Diagnosis not present

## 2021-07-22 DIAGNOSIS — R6521 Severe sepsis with septic shock: Secondary | ICD-10-CM | POA: Diagnosis not present

## 2021-07-22 DIAGNOSIS — A419 Sepsis, unspecified organism: Secondary | ICD-10-CM | POA: Diagnosis not present

## 2021-07-22 DIAGNOSIS — N179 Acute kidney failure, unspecified: Secondary | ICD-10-CM | POA: Diagnosis not present

## 2021-07-22 DIAGNOSIS — I959 Hypotension, unspecified: Secondary | ICD-10-CM | POA: Diagnosis not present

## 2021-07-22 DIAGNOSIS — I130 Hypertensive heart and chronic kidney disease with heart failure and stage 1 through stage 4 chronic kidney disease, or unspecified chronic kidney disease: Secondary | ICD-10-CM | POA: Diagnosis not present

## 2021-07-22 DIAGNOSIS — I5032 Chronic diastolic (congestive) heart failure: Secondary | ICD-10-CM | POA: Diagnosis not present

## 2021-07-23 DIAGNOSIS — A419 Sepsis, unspecified organism: Secondary | ICD-10-CM | POA: Diagnosis not present

## 2021-07-23 DIAGNOSIS — I130 Hypertensive heart and chronic kidney disease with heart failure and stage 1 through stage 4 chronic kidney disease, or unspecified chronic kidney disease: Secondary | ICD-10-CM | POA: Diagnosis not present

## 2021-07-23 DIAGNOSIS — N1832 Chronic kidney disease, stage 3b: Secondary | ICD-10-CM | POA: Diagnosis not present

## 2021-07-23 DIAGNOSIS — N136 Pyonephrosis: Secondary | ICD-10-CM | POA: Diagnosis not present

## 2021-07-23 DIAGNOSIS — I5032 Chronic diastolic (congestive) heart failure: Secondary | ICD-10-CM | POA: Diagnosis not present

## 2021-07-23 DIAGNOSIS — R6521 Severe sepsis with septic shock: Secondary | ICD-10-CM | POA: Diagnosis not present

## 2021-07-24 DIAGNOSIS — N136 Pyonephrosis: Secondary | ICD-10-CM | POA: Diagnosis not present

## 2021-07-24 DIAGNOSIS — N1832 Chronic kidney disease, stage 3b: Secondary | ICD-10-CM | POA: Diagnosis not present

## 2021-07-24 DIAGNOSIS — I130 Hypertensive heart and chronic kidney disease with heart failure and stage 1 through stage 4 chronic kidney disease, or unspecified chronic kidney disease: Secondary | ICD-10-CM | POA: Diagnosis not present

## 2021-07-24 DIAGNOSIS — R6521 Severe sepsis with septic shock: Secondary | ICD-10-CM | POA: Diagnosis not present

## 2021-07-24 DIAGNOSIS — I5032 Chronic diastolic (congestive) heart failure: Secondary | ICD-10-CM | POA: Diagnosis not present

## 2021-07-24 DIAGNOSIS — A419 Sepsis, unspecified organism: Secondary | ICD-10-CM | POA: Diagnosis not present

## 2021-07-26 DIAGNOSIS — N136 Pyonephrosis: Secondary | ICD-10-CM | POA: Diagnosis not present

## 2021-07-26 DIAGNOSIS — A419 Sepsis, unspecified organism: Secondary | ICD-10-CM | POA: Diagnosis not present

## 2021-07-26 DIAGNOSIS — R6521 Severe sepsis with septic shock: Secondary | ICD-10-CM | POA: Diagnosis not present

## 2021-07-26 DIAGNOSIS — I5032 Chronic diastolic (congestive) heart failure: Secondary | ICD-10-CM | POA: Diagnosis not present

## 2021-07-26 DIAGNOSIS — I130 Hypertensive heart and chronic kidney disease with heart failure and stage 1 through stage 4 chronic kidney disease, or unspecified chronic kidney disease: Secondary | ICD-10-CM | POA: Diagnosis not present

## 2021-07-26 DIAGNOSIS — N1832 Chronic kidney disease, stage 3b: Secondary | ICD-10-CM | POA: Diagnosis not present

## 2021-07-29 ENCOUNTER — Telehealth: Payer: Self-pay

## 2021-07-29 DIAGNOSIS — N1832 Chronic kidney disease, stage 3b: Secondary | ICD-10-CM | POA: Diagnosis not present

## 2021-07-29 DIAGNOSIS — I130 Hypertensive heart and chronic kidney disease with heart failure and stage 1 through stage 4 chronic kidney disease, or unspecified chronic kidney disease: Secondary | ICD-10-CM | POA: Diagnosis not present

## 2021-07-29 DIAGNOSIS — N136 Pyonephrosis: Secondary | ICD-10-CM | POA: Diagnosis not present

## 2021-07-29 DIAGNOSIS — A419 Sepsis, unspecified organism: Secondary | ICD-10-CM | POA: Diagnosis not present

## 2021-07-29 DIAGNOSIS — I5032 Chronic diastolic (congestive) heart failure: Secondary | ICD-10-CM | POA: Diagnosis not present

## 2021-07-29 DIAGNOSIS — R6521 Severe sepsis with septic shock: Secondary | ICD-10-CM | POA: Diagnosis not present

## 2021-07-29 NOTE — Telephone Encounter (Signed)
Home health Nurse Colletta [place] [phone]  called a wanting order for for patient urostomy bags  and how often to change  and how much to flush ,  so she can educated the pt and his wife. Please advise

## 2021-07-30 DIAGNOSIS — I5032 Chronic diastolic (congestive) heart failure: Secondary | ICD-10-CM | POA: Diagnosis not present

## 2021-07-30 DIAGNOSIS — I130 Hypertensive heart and chronic kidney disease with heart failure and stage 1 through stage 4 chronic kidney disease, or unspecified chronic kidney disease: Secondary | ICD-10-CM | POA: Diagnosis not present

## 2021-07-30 DIAGNOSIS — N136 Pyonephrosis: Secondary | ICD-10-CM | POA: Diagnosis not present

## 2021-07-30 DIAGNOSIS — R6521 Severe sepsis with septic shock: Secondary | ICD-10-CM | POA: Diagnosis not present

## 2021-07-30 DIAGNOSIS — N1832 Chronic kidney disease, stage 3b: Secondary | ICD-10-CM | POA: Diagnosis not present

## 2021-07-30 DIAGNOSIS — A419 Sepsis, unspecified organism: Secondary | ICD-10-CM | POA: Diagnosis not present

## 2021-07-31 NOTE — Telephone Encounter (Signed)
Left message of order per Dr. [name] to flush nephrostomy tube daily with 10cc of saline

## 2021-08-01 DIAGNOSIS — R6521 Severe sepsis with septic shock: Secondary | ICD-10-CM | POA: Diagnosis not present

## 2021-08-01 DIAGNOSIS — A419 Sepsis, unspecified organism: Secondary | ICD-10-CM | POA: Diagnosis not present

## 2021-08-01 DIAGNOSIS — N1832 Chronic kidney disease, stage 3b: Secondary | ICD-10-CM | POA: Diagnosis not present

## 2021-08-01 DIAGNOSIS — N136 Pyonephrosis: Secondary | ICD-10-CM | POA: Diagnosis not present

## 2021-08-01 DIAGNOSIS — I130 Hypertensive heart and chronic kidney disease with heart failure and stage 1 through stage 4 chronic kidney disease, or unspecified chronic kidney disease: Secondary | ICD-10-CM | POA: Diagnosis not present

## 2021-08-01 DIAGNOSIS — I5032 Chronic diastolic (congestive) heart failure: Secondary | ICD-10-CM | POA: Diagnosis not present

## 2021-08-02 DIAGNOSIS — R6521 Severe sepsis with septic shock: Secondary | ICD-10-CM | POA: Diagnosis not present

## 2021-08-02 DIAGNOSIS — N1832 Chronic kidney disease, stage 3b: Secondary | ICD-10-CM | POA: Diagnosis not present

## 2021-08-02 DIAGNOSIS — N136 Pyonephrosis: Secondary | ICD-10-CM | POA: Diagnosis not present

## 2021-08-02 DIAGNOSIS — A419 Sepsis, unspecified organism: Secondary | ICD-10-CM | POA: Diagnosis not present

## 2021-08-02 DIAGNOSIS — I130 Hypertensive heart and chronic kidney disease with heart failure and stage 1 through stage 4 chronic kidney disease, or unspecified chronic kidney disease: Secondary | ICD-10-CM | POA: Diagnosis not present

## 2021-08-02 DIAGNOSIS — I5032 Chronic diastolic (congestive) heart failure: Secondary | ICD-10-CM | POA: Diagnosis not present

## 2021-08-05 DIAGNOSIS — I2510 Atherosclerotic heart disease of native coronary artery without angina pectoris: Secondary | ICD-10-CM | POA: Diagnosis not present

## 2021-08-05 DIAGNOSIS — R7303 Prediabetes: Secondary | ICD-10-CM | POA: Diagnosis not present

## 2021-08-05 DIAGNOSIS — G459 Transient cerebral ischemic attack, unspecified: Secondary | ICD-10-CM | POA: Diagnosis not present

## 2021-08-05 DIAGNOSIS — G35 Multiple sclerosis: Secondary | ICD-10-CM | POA: Diagnosis not present

## 2021-08-05 DIAGNOSIS — N179 Acute kidney failure, unspecified: Secondary | ICD-10-CM | POA: Diagnosis not present

## 2021-08-05 DIAGNOSIS — G894 Chronic pain syndrome: Secondary | ICD-10-CM | POA: Diagnosis not present

## 2021-08-05 DIAGNOSIS — D631 Anemia in chronic kidney disease: Secondary | ICD-10-CM | POA: Diagnosis not present

## 2021-08-05 DIAGNOSIS — N139 Obstructive and reflux uropathy, unspecified: Secondary | ICD-10-CM | POA: Diagnosis not present

## 2021-08-05 DIAGNOSIS — N184 Chronic kidney disease, stage 4 (severe): Secondary | ICD-10-CM | POA: Diagnosis not present

## 2021-08-05 DIAGNOSIS — I129 Hypertensive chronic kidney disease with stage 1 through stage 4 chronic kidney disease, or unspecified chronic kidney disease: Secondary | ICD-10-CM | POA: Diagnosis not present

## 2021-08-05 DIAGNOSIS — N189 Chronic kidney disease, unspecified: Secondary | ICD-10-CM | POA: Diagnosis not present

## 2021-08-05 DIAGNOSIS — N261 Atrophy of kidney (terminal): Secondary | ICD-10-CM | POA: Diagnosis not present

## 2021-08-06 DIAGNOSIS — I5032 Chronic diastolic (congestive) heart failure: Secondary | ICD-10-CM | POA: Diagnosis not present

## 2021-08-06 DIAGNOSIS — I130 Hypertensive heart and chronic kidney disease with heart failure and stage 1 through stage 4 chronic kidney disease, or unspecified chronic kidney disease: Secondary | ICD-10-CM | POA: Diagnosis not present

## 2021-08-06 DIAGNOSIS — N136 Pyonephrosis: Secondary | ICD-10-CM | POA: Diagnosis not present

## 2021-08-06 DIAGNOSIS — A419 Sepsis, unspecified organism: Secondary | ICD-10-CM | POA: Diagnosis not present

## 2021-08-06 DIAGNOSIS — N1832 Chronic kidney disease, stage 3b: Secondary | ICD-10-CM | POA: Diagnosis not present

## 2021-08-06 DIAGNOSIS — R6521 Severe sepsis with septic shock: Secondary | ICD-10-CM | POA: Diagnosis not present

## 2021-08-07 DIAGNOSIS — N136 Pyonephrosis: Secondary | ICD-10-CM | POA: Diagnosis not present

## 2021-08-07 DIAGNOSIS — A419 Sepsis, unspecified organism: Secondary | ICD-10-CM | POA: Diagnosis not present

## 2021-08-07 DIAGNOSIS — R6521 Severe sepsis with septic shock: Secondary | ICD-10-CM | POA: Diagnosis not present

## 2021-08-07 DIAGNOSIS — N1832 Chronic kidney disease, stage 3b: Secondary | ICD-10-CM | POA: Diagnosis not present

## 2021-08-07 DIAGNOSIS — I5032 Chronic diastolic (congestive) heart failure: Secondary | ICD-10-CM | POA: Diagnosis not present

## 2021-08-07 DIAGNOSIS — I130 Hypertensive heart and chronic kidney disease with heart failure and stage 1 through stage 4 chronic kidney disease, or unspecified chronic kidney disease: Secondary | ICD-10-CM | POA: Diagnosis not present

## 2021-08-08 ENCOUNTER — Other Ambulatory Visit: Payer: Self-pay | Admitting: Neurology

## 2021-08-08 DIAGNOSIS — R6521 Severe sepsis with septic shock: Secondary | ICD-10-CM | POA: Diagnosis not present

## 2021-08-08 DIAGNOSIS — A419 Sepsis, unspecified organism: Secondary | ICD-10-CM | POA: Diagnosis not present

## 2021-08-08 DIAGNOSIS — I130 Hypertensive heart and chronic kidney disease with heart failure and stage 1 through stage 4 chronic kidney disease, or unspecified chronic kidney disease: Secondary | ICD-10-CM | POA: Diagnosis not present

## 2021-08-08 DIAGNOSIS — N1832 Chronic kidney disease, stage 3b: Secondary | ICD-10-CM | POA: Diagnosis not present

## 2021-08-08 DIAGNOSIS — I5032 Chronic diastolic (congestive) heart failure: Secondary | ICD-10-CM | POA: Diagnosis not present

## 2021-08-08 DIAGNOSIS — N136 Pyonephrosis: Secondary | ICD-10-CM | POA: Diagnosis not present

## 2021-08-08 MED ORDER — DIAZEPAM 5 MG PO TABS: ORAL_TABLET | ORAL | 3 refills | Status: DC

## 2021-08-08 NOTE — Telephone Encounter (Signed)
Pt is requesting a refill for diazepam (VALIUM) 5 MG tablet .  Pharmacy: [facility]

## 2021-08-08 NOTE — Telephone Encounter (Signed)
Refill has been sent by Dr. Krista Blue.

## 2021-08-12 DIAGNOSIS — R6521 Severe sepsis with septic shock: Secondary | ICD-10-CM | POA: Diagnosis not present

## 2021-08-12 DIAGNOSIS — I130 Hypertensive heart and chronic kidney disease with heart failure and stage 1 through stage 4 chronic kidney disease, or unspecified chronic kidney disease: Secondary | ICD-10-CM | POA: Diagnosis not present

## 2021-08-12 DIAGNOSIS — I5032 Chronic diastolic (congestive) heart failure: Secondary | ICD-10-CM | POA: Diagnosis not present

## 2021-08-12 DIAGNOSIS — N136 Pyonephrosis: Secondary | ICD-10-CM | POA: Diagnosis not present

## 2021-08-12 DIAGNOSIS — A419 Sepsis, unspecified organism: Secondary | ICD-10-CM | POA: Diagnosis not present

## 2021-08-12 DIAGNOSIS — N1832 Chronic kidney disease, stage 3b: Secondary | ICD-10-CM | POA: Diagnosis not present

## 2021-08-13 ENCOUNTER — Ambulatory Visit (INDEPENDENT_AMBULATORY_CARE_PROVIDER_SITE_OTHER): Payer: Medicare Other | Admitting: Urology

## 2021-08-13 ENCOUNTER — Encounter: Payer: Self-pay | Admitting: Urology

## 2021-08-13 ENCOUNTER — Other Ambulatory Visit: Payer: Self-pay

## 2021-08-13 VITALS — BP 134/75 | HR 83 | Temp 97.6°F | Wt 115.0 lb

## 2021-08-13 DIAGNOSIS — N1330 Unspecified hydronephrosis: Secondary | ICD-10-CM | POA: Diagnosis not present

## 2021-08-13 DIAGNOSIS — R339 Retention of urine, unspecified: Secondary | ICD-10-CM

## 2021-08-13 DIAGNOSIS — N312 Flaccid neuropathic bladder, not elsewhere classified: Secondary | ICD-10-CM | POA: Diagnosis not present

## 2021-08-13 DIAGNOSIS — N261 Atrophy of kidney (terminal): Secondary | ICD-10-CM | POA: Diagnosis not present

## 2021-08-13 DIAGNOSIS — N200 Calculus of kidney: Secondary | ICD-10-CM

## 2021-08-13 NOTE — Progress Notes (Signed)
H&P    History of Present Illness: Jordan Ward is here today for follow-up.  He has a neurogenic bladder.  He has had a longstanding suprapubic tube.  Currently, output is not great as he has an atrophic kidney and a left percutaneous nephrostomy.  He still has bladder spasms and is on Myrbetriq 25 mg a day.  He has recurrent urolithiasis, left renal unit.  Because of multiple ureteral stones and longstanding hydronephrosis, he had a left-sided nephrostomy tube placed in May, 2022.  He states that it hurts when they change it.  Most recent change was October 15.  CT abdomen and pelvis for follow-up of urolithiasis was performed the day before revealing hydronephrosis and misplaced percutaneous tube.  He did have a 30mm left renal pelvic stone at that time.  Past Medical History:  Diagnosis Date   Anemia    Arthritis    Back pain, chronic    Bilateral carotid bruits    C. difficile colitis 09/2011   CAD (coronary artery disease)    Carotid artery stenosis    Cerebrovascular disease    Cerebrovascular disease 08/17/2018   Colon polyps    Complication of cystostomy catheter, initial encounter ([place]) 04/20/2020   Dyslipidemia    Dysphagia 10/07/2011   Encephalopathy    Gait disorder    HA (headache)    High grade dysplasia in colonic adenoma 09/2005   History of kidney stones    HTN (hypertension), malignant 10/06/2011   Hypernatremia    Hypokalemia    Hypothyroidism 10/08/2011   Insomnia    Junctional rhythm    Kidney stones    MS (multiple sclerosis) (HCC)    Neuromuscular disorder (HCC)    MS   OSA (obstructive sleep apnea)    Paroxysmal atrial tachycardia ([place])    Peripheral vascular disease ([place])    Pneumonia 4 yrs ago   Pulmonary fibrosis ([place]) 10/06/2011   Pulmonary nodule 10/08/2011   PVD (peripheral vascular disease) ([place])    Sacral ulcer (HCC)    Sleep apnea    cannot tolerate   Stroke ([place])    left sided weakness   Suprapubic catheter ([place])    TIA (transient ischemic attack)     Tremors of nervous system 10/08/2011   Urinary tract infection    Ventral hernia without obstruction or gangrene     Large 8X9cm ventral hernia with loss of domain. CT reads report as diastasis recti with herniation or diastasis recti.  Dr. Constance Haw, Surgery, reviewed CT with radiology and there is herniation with only hernia sac or peritoneum over the bowel and large separation of the rectus muslce (i.e. diastasis recti aka loss of domain).  No surgical intervention recommended given size, age, and health.     Past Surgical History:  Procedure Laterality Date   APPENDECTOMY  09/2005   at time of left hemicolectomy   BACK SURGERY  1976/1979   lower   BILIARY DILATION N/A 03/03/2020   Procedure: BILIARY DILATION;  Surgeon: Rogene Houston, MD;  [facility];  Service: Endoscopy;  Laterality: N/A;   CATARACT EXTRACTION W/PHACO Right 03/08/2018   Procedure: CATARACT EXTRACTION PHACO AND INTRAOCULAR LENS PLACEMENT RIGHT EYE;  Surgeon: Tonny Branch, MD;  Location: [facility];  Service: Ophthalmology;  Laterality: Right;  CDE: 8.86   CATARACT EXTRACTION W/PHACO Left 04/05/2018   Procedure: CATARACT EXTRACTION PHACO AND INTRAOCULAR LENS PLACEMENT (IOC);  Surgeon: Tonny Branch, MD;  Location: [facility];  Service: Ophthalmology;  Laterality: Left;  CDE: 7.36  CENTRAL LINE INSERTION-TUNNELED Right 09/11/2020   Procedure: PLACEMENT OF TUNNELED CENTRAL LINE INTO JUGULAR VEIN;  Surgeon: Virl Cagey, MD;  Location: [facility];  Service: General;  Laterality: Right;   CHOLECYSTECTOMY     Dr. Tamala Julian   COLON SURGERY  09/2005   [place]: four tubular adenomas, large adenomatous polyp with HIGH GRADE dysplasia   COLONOSCOPY  11/2004   Dr. Sharol Roussel sessile polyp splenic flexure, 36mm sessile polyp desc colon, tubulovillous adenoma (bx not removed)   COLONOSCOPY  01/2005   poor prep, polyp could not be found   COLONOSCOPY  05/2005   with EMR, polypectomy Dr. Olegario Messier, bx showed high grade dysplasia,  partially resected   COLONOSCOPY  09/2005   Dr. Arsenio Loader, [place] ink tattooing, four villous colon polyp (3 had been missed on previous colonoscopies due to limitations of procedures   COLONOSCOPY  09/2006   normal TI, no polyps   COLONOSCOPY  10/2007   Dr. Imogene Burn distal mammillations, benign bx, normal TI, random bx neg for microscopic colitis   CYSTOSCOPY W/ URETERAL STENT PLACEMENT Left 09/05/2020   Procedure: CYSTOSCOPY WITH RETROGRADE PYELOGRAM/URETERAL STENT PLACEMENT;  Surgeon: Ardis Hughs, MD;  Location: [facility];  Service: Urology;  Laterality: Left;   CYSTOSCOPY WITH LITHOLAPAXY N/A 07/27/2018   Procedure: CYSTOSCOPY WITH LITHOLAPAXY VIA  SUPRAPUBIC TUBE;  Surgeon: Franchot Gallo, MD;  Location: [facility];  Service: Urology;  Laterality: N/A;   CYSTOSCOPY WITH RETROGRADE PYELOGRAM, URETEROSCOPY AND STENT PLACEMENT Left 06/09/2017   Procedure: CYSTOSCOPY WITH LEFT RETROGRADE PYELOGRAM, LEFT URETEROSCOPY, LEFT URETEROSCOPIC STONE EXTRACTION, LEFT URETERAL STENT PLACEMENT;  Surgeon: Franchot Gallo, MD;  Location: [facility];  Service: Urology;  Laterality: Left;   CYSTOSCOPY/URETEROSCOPY/HOLMIUM LASER/STENT PLACEMENT Left 05/22/2020   Procedure: CYSTOSCOPY/URETEROSCOPY/STENT PLACEMENT;  Surgeon: Franchot Gallo, MD;  [facility]: [facility];  Service: Urology;  Laterality: Left;   ERCP N/A 03/03/2020   Procedure: ENDOSCOPIC RETROGRADE CHOLANGIOPANCREATOGRAPHY (ERCP);  Surgeon: Rogene Houston, MD;  [facility];  Service: Endoscopy;  Laterality: N/A;   ERCP N/A 04/20/2020   Procedure: ENDOSCOPIC RETROGRADE CHOLANGIOPANCREATOGRAPHY (ERCP);  Surgeon: Rogene Houston, MD;  [facility];  Service: Endoscopy;  Laterality: N/A;  to be done at 7:30am in [place] N/A 04/20/2020   Procedure: STENT REMOVAL;  Surgeon: Rogene Houston, MD;  [facility];  Service: Endoscopy;  Laterality: N/A;   INGUINAL HERNIA REPAIR  1971   bilateral    INSERTION OF SUPRAPUBIC CATHETER  06/09/2017   Procedure: EXCHANGE OF SUPRAPUBIC CATHETER;  Surgeon: Franchot Gallo, MD;  Location: [facility];  Service: Urology;;   INSERTION OF SUPRAPUBIC CATHETER  05/22/2020   Procedure: SUPRAPUBIC CATHETER EXCHANGE;  Surgeon: Franchot Gallo, MD;  Location: [facility];  Service: Urology;;   IR NEPHROSTOMY EXCHANGE LEFT  03/25/2021   IR NEPHROSTOMY EXCHANGE LEFT  05/20/2021   IR NEPHROSTOMY EXCHANGE LEFT  07/13/2021   IR NEPHROSTOMY PLACEMENT LEFT  05/26/2017   IR NEPHROSTOMY PLACEMENT LEFT  05/02/2020   IR NEPHROSTOMY PLACEMENT LEFT  02/15/2021   IR URETERAL STENT PERC REMOVAL MOD SED  03/25/2021   KIDNEY STONE SURGERY  09/13/2015   LITHOTRIPSY N/A 03/03/2020   Procedure: MECHANICAL LITHOTRIPSY WITH REMOVAL OF MULTIPLE STONE FRAGMENTS;  Surgeon: Rogene Houston, MD;  [facility];  Service: Endoscopy;  Laterality: N/A;   NEPHROLITHOTOMY Left 09/13/2015   Procedure: LEFT PERCUTANEOUS NEPHROLITHOTOMY ;  Surgeon: Franchot Gallo, MD;  Location[facility];  Service: Urology;  Laterality:  Left;   NEPHROSTOMY TUBE REMOVAL  05/22/2020   Procedure: NEPHROSTOMY TUBE REMOVAL;  Surgeon: Franchot Gallo, MD;  Location: [facility];  Service: Urology;;   [facility] REMOVAL Right 09/20/2020   Procedure: MINOR REMOVAL CENTRAL LINE;  Surgeon: Virl Cagey, MD;  Location: [facility];  Service: General;  Laterality: Right;  Pt to arrive at 7:30am for procedure   REMOVAL OF STONES N/A 04/20/2020   Procedure: REMOVAL OF STONES;  Surgeon: Rogene Houston, MD;  [facility];  Service: Endoscopy;  Laterality: N/A;   SPHINCTEROTOMY  03/03/2020   Procedure: BILLARY SPHINCTEROTOMY;  Surgeon: Rogene Houston, MD;  [facility];  Service: Endoscopy;;   SUPRAPUBIC CATHETER INSERTION      Home Medications:  Allergies as of 08/13/2021       Reactions   Tetracyclines & Related Anaphylaxis, Rash   Ciprofloxacin    Trouble swallowing unknown reaction according  to wife         Medication List        Accurate as of August 13, 2021 11:58 AM. If you have any questions, ask your nurse or doctor.          acetaminophen 325 MG tablet Commonly known as: TYLENOL Take 650 mg by mouth every 6 (six) hours as needed.   amLODipine 10 MG tablet Commonly known as: NORVASC Take 10 mg by mouth daily.   diazepam 5 MG tablet Commonly known as: VALIUM TAKE 1 TABLET EVERY 12 HOURS AS NEEDED FOR MUSCLE SPASMS   HYDROcodone-acetaminophen 7.5-325 MG tablet Commonly known as: NORCO Take 1 tablet by mouth 2 (two) times daily. Max APAP 3 GM IN 24 HOURS FROM ALL SOURCES What changed:  when to take this additional instructions   levothyroxine 75 MCG tablet Commonly known as: SYNTHROID Take 75 mcg by mouth daily.   OXYGEN Inhale 1 L into the lungs every evening.   potassium chloride SA 20 MEQ tablet Commonly known as: KLOR-CON Take 20 mEq by mouth daily.        Allergies:  Allergies  Allergen Reactions   Tetracyclines & Related Anaphylaxis and Rash   Ciprofloxacin     Trouble swallowing unknown reaction according to wife     Family History  Problem Relation Age of Onset   Cirrhosis Brother        etoh   Stroke Mother 54   Coronary artery disease Father 90   Heart attack Brother    Cancer Sister    Multiple sclerosis Other    Colon cancer Neg Hx     Social History:  reports that he quit smoking about 33 years ago. His smoking use included cigarettes. He has a 25.00 pack-year smoking history. He has never used smokeless tobacco. He reports that he does not drink alcohol and does not use drugs.  ROS: A complete review of systems was performed.  All systems are negative except for pertinent findings as noted.  Physical Exam:  Vital signs in last 24 hours: BP 134/75   Pulse 83   Temp 97.6 F (36.4 C)   Wt 115 lb (52.2 kg)   BMI 18.56 kg/m  Constitutional:  Alert and oriented, No acute distress Cardiovascular: Regular rate   Respiratory: Normal respiratory effort Neurologic: Grossly intact, no focal deficits Psychiatric: Normal mood and affect  I have reviewed prior pt notes   I have independently reviewed prior imaging--I reviewed CT images with the patient and his wife  I reviewed patient's most recent  laboratories.  Creatinine approximately 1.8.  He will be seeing Dr. Marval Regal in the future  I have reviewed prior urine culture   Impression/Assessment:  1.  Neurogenic bladder with suprapubic tube in place, most urine out  nephrostomy tube on the left as he has an atrophic right kidney  2.  Left-sided urolithiasis.  72mm stone in renal pelvis.  As he is asymptomatic and has a nephrostomy tube, I think we will watch this  3.  Atrophic right kidney with renal insufficiency, he will be seeing a nephrologist soon  Plan:  1.  Suprapubic tube changed today  2.  Nephrostomy tube changes per interventional radiology.  3.  I will see the patient back in 2 months following repeat CT abdomen/pelvis without contrast.  We will treat the stone unless it shows significant growth

## 2021-08-13 NOTE — Progress Notes (Signed)
Suprapubic Cath Change  Patient is present today for a suprapubic catheter change due to urinary retention.  73ml of water was drained from the balloon, a 24FR foley cath was removed from the tract with out difficulty.  Site was cleaned and prepped in a sterile fashion with betadine.  A 24FR foley cath was replaced into the tract no complications were noted. Urine return was noted, 10 ml of sterile water was inflated into the balloon and a leg bag was attached for drainage.  Patient tolerated well.  Performed by: Terrian Sentell LPN  Follow up: 1 month cath change

## 2021-08-14 DIAGNOSIS — I5032 Chronic diastolic (congestive) heart failure: Secondary | ICD-10-CM | POA: Diagnosis not present

## 2021-08-14 DIAGNOSIS — I130 Hypertensive heart and chronic kidney disease with heart failure and stage 1 through stage 4 chronic kidney disease, or unspecified chronic kidney disease: Secondary | ICD-10-CM | POA: Diagnosis not present

## 2021-08-14 DIAGNOSIS — N1832 Chronic kidney disease, stage 3b: Secondary | ICD-10-CM | POA: Diagnosis not present

## 2021-08-14 DIAGNOSIS — N136 Pyonephrosis: Secondary | ICD-10-CM | POA: Diagnosis not present

## 2021-08-14 DIAGNOSIS — A419 Sepsis, unspecified organism: Secondary | ICD-10-CM | POA: Diagnosis not present

## 2021-08-14 DIAGNOSIS — R6521 Severe sepsis with septic shock: Secondary | ICD-10-CM | POA: Diagnosis not present

## 2021-08-15 DIAGNOSIS — Z9181 History of falling: Secondary | ICD-10-CM | POA: Diagnosis not present

## 2021-08-15 DIAGNOSIS — E785 Hyperlipidemia, unspecified: Secondary | ICD-10-CM | POA: Diagnosis not present

## 2021-08-15 DIAGNOSIS — G35 Multiple sclerosis: Secondary | ICD-10-CM | POA: Diagnosis not present

## 2021-08-15 DIAGNOSIS — A419 Sepsis, unspecified organism: Secondary | ICD-10-CM | POA: Diagnosis not present

## 2021-08-15 DIAGNOSIS — R1310 Dysphagia, unspecified: Secondary | ICD-10-CM | POA: Diagnosis not present

## 2021-08-15 DIAGNOSIS — I420 Dilated cardiomyopathy: Secondary | ICD-10-CM | POA: Diagnosis not present

## 2021-08-15 DIAGNOSIS — N1832 Chronic kidney disease, stage 3b: Secondary | ICD-10-CM | POA: Diagnosis not present

## 2021-08-15 DIAGNOSIS — R6521 Severe sepsis with septic shock: Secondary | ICD-10-CM | POA: Diagnosis not present

## 2021-08-15 DIAGNOSIS — J8410 Pulmonary fibrosis, unspecified: Secondary | ICD-10-CM | POA: Diagnosis not present

## 2021-08-15 DIAGNOSIS — G4733 Obstructive sleep apnea (adult) (pediatric): Secondary | ICD-10-CM | POA: Diagnosis not present

## 2021-08-15 DIAGNOSIS — I2510 Atherosclerotic heart disease of native coronary artery without angina pectoris: Secondary | ICD-10-CM | POA: Diagnosis not present

## 2021-08-15 DIAGNOSIS — I5032 Chronic diastolic (congestive) heart failure: Secondary | ICD-10-CM | POA: Diagnosis not present

## 2021-08-15 DIAGNOSIS — N136 Pyonephrosis: Secondary | ICD-10-CM | POA: Diagnosis not present

## 2021-08-15 DIAGNOSIS — G8929 Other chronic pain: Secondary | ICD-10-CM | POA: Diagnosis not present

## 2021-08-15 DIAGNOSIS — N179 Acute kidney failure, unspecified: Secondary | ICD-10-CM | POA: Diagnosis not present

## 2021-08-15 DIAGNOSIS — J9611 Chronic respiratory failure with hypoxia: Secondary | ICD-10-CM | POA: Diagnosis not present

## 2021-08-15 DIAGNOSIS — J449 Chronic obstructive pulmonary disease, unspecified: Secondary | ICD-10-CM | POA: Diagnosis not present

## 2021-08-15 DIAGNOSIS — I69354 Hemiplegia and hemiparesis following cerebral infarction affecting left non-dominant side: Secondary | ICD-10-CM | POA: Diagnosis not present

## 2021-08-15 DIAGNOSIS — D631 Anemia in chronic kidney disease: Secondary | ICD-10-CM | POA: Diagnosis not present

## 2021-08-15 DIAGNOSIS — E876 Hypokalemia: Secondary | ICD-10-CM | POA: Diagnosis not present

## 2021-08-15 DIAGNOSIS — I739 Peripheral vascular disease, unspecified: Secondary | ICD-10-CM | POA: Diagnosis not present

## 2021-08-15 DIAGNOSIS — I130 Hypertensive heart and chronic kidney disease with heart failure and stage 1 through stage 4 chronic kidney disease, or unspecified chronic kidney disease: Secondary | ICD-10-CM | POA: Diagnosis not present

## 2021-08-15 DIAGNOSIS — E039 Hypothyroidism, unspecified: Secondary | ICD-10-CM | POA: Diagnosis not present

## 2021-08-15 DIAGNOSIS — Z79891 Long term (current) use of opiate analgesic: Secondary | ICD-10-CM | POA: Diagnosis not present

## 2021-08-15 DIAGNOSIS — Z9981 Dependence on supplemental oxygen: Secondary | ICD-10-CM | POA: Diagnosis not present

## 2021-08-29 DIAGNOSIS — A419 Sepsis, unspecified organism: Secondary | ICD-10-CM | POA: Diagnosis not present

## 2021-08-29 DIAGNOSIS — N1832 Chronic kidney disease, stage 3b: Secondary | ICD-10-CM | POA: Diagnosis not present

## 2021-08-29 DIAGNOSIS — R6521 Severe sepsis with septic shock: Secondary | ICD-10-CM | POA: Diagnosis not present

## 2021-08-29 DIAGNOSIS — N136 Pyonephrosis: Secondary | ICD-10-CM | POA: Diagnosis not present

## 2021-08-29 DIAGNOSIS — I5032 Chronic diastolic (congestive) heart failure: Secondary | ICD-10-CM | POA: Diagnosis not present

## 2021-08-29 DIAGNOSIS — I130 Hypertensive heart and chronic kidney disease with heart failure and stage 1 through stage 4 chronic kidney disease, or unspecified chronic kidney disease: Secondary | ICD-10-CM | POA: Diagnosis not present

## 2021-08-30 DIAGNOSIS — M79601 Pain in right arm: Secondary | ICD-10-CM | POA: Diagnosis not present

## 2021-08-30 DIAGNOSIS — G894 Chronic pain syndrome: Secondary | ICD-10-CM | POA: Diagnosis not present

## 2021-09-05 DIAGNOSIS — I5032 Chronic diastolic (congestive) heart failure: Secondary | ICD-10-CM | POA: Diagnosis not present

## 2021-09-05 DIAGNOSIS — N1832 Chronic kidney disease, stage 3b: Secondary | ICD-10-CM | POA: Diagnosis not present

## 2021-09-05 DIAGNOSIS — R6521 Severe sepsis with septic shock: Secondary | ICD-10-CM | POA: Diagnosis not present

## 2021-09-05 DIAGNOSIS — A419 Sepsis, unspecified organism: Secondary | ICD-10-CM | POA: Diagnosis not present

## 2021-09-05 DIAGNOSIS — N136 Pyonephrosis: Secondary | ICD-10-CM | POA: Diagnosis not present

## 2021-09-05 DIAGNOSIS — I130 Hypertensive heart and chronic kidney disease with heart failure and stage 1 through stage 4 chronic kidney disease, or unspecified chronic kidney disease: Secondary | ICD-10-CM | POA: Diagnosis not present

## 2021-09-09 ENCOUNTER — Other Ambulatory Visit: Payer: Self-pay

## 2021-09-09 ENCOUNTER — Other Ambulatory Visit (HOSPITAL_COMMUNITY): Payer: Self-pay | Admitting: Diagnostic Radiology

## 2021-09-09 ENCOUNTER — Ambulatory Visit (HOSPITAL_COMMUNITY)
Admission: RE | Admit: 2021-09-09 | Discharge: 2021-09-09 | Disposition: A | Discharge status: Home / Self Care | Payer: Medicare Other | Source: Ambulatory Visit | Attending: Interventional Radiology | Admitting: Interventional Radiology

## 2021-09-09 DIAGNOSIS — N319 Neuromuscular dysfunction of bladder, unspecified: Secondary | ICD-10-CM | POA: Diagnosis not present

## 2021-09-09 DIAGNOSIS — Z466 Encounter for fitting and adjustment of urinary device: Secondary | ICD-10-CM | POA: Diagnosis not present

## 2021-09-09 DIAGNOSIS — Z436 Encounter for attention to other artificial openings of urinary tract: Secondary | ICD-10-CM | POA: Diagnosis not present

## 2021-09-09 HISTORY — PX: IR NEPHROSTOMY EXCHANGE LEFT: IMG6069

## 2021-09-09 MED ORDER — LIDOCAINE HCL 1 % IJ SOLN
INTRAMUSCULAR | Status: AC
  Administered 2021-09-09: 5 mL
  Filled 2021-09-09: qty 20

## 2021-09-09 MED ORDER — LIDOCAINE HCL 1 % IJ SOLN
INTRAMUSCULAR | Status: DC | PRN
  Administered 2021-09-09: 5 mL

## 2021-09-09 MED ORDER — IOHEXOL 300 MG/ML  SOLN
50.0000 mL | Freq: Once | INTRAMUSCULAR | Status: AC | PRN
  Administered 2021-09-09: 10 mL

## 2021-09-09 NOTE — Procedures (Signed)
Interventional Radiology Procedure:   Indications: Routine left nephrostomy tube exchange  Procedure: Nephrostomy tube exchange  Findings: New 12 Fr tube in left renal pelvis  Complications: No immediate complications noted.     EBL: Minimal  Plan: Continue with routine exchanges   Ashlon Lottman R. Anselm Pancoast, MD  Pager: [phone]

## 2021-09-12 DIAGNOSIS — I5032 Chronic diastolic (congestive) heart failure: Secondary | ICD-10-CM | POA: Diagnosis not present

## 2021-09-12 DIAGNOSIS — I130 Hypertensive heart and chronic kidney disease with heart failure and stage 1 through stage 4 chronic kidney disease, or unspecified chronic kidney disease: Secondary | ICD-10-CM | POA: Diagnosis not present

## 2021-09-12 DIAGNOSIS — R6521 Severe sepsis with septic shock: Secondary | ICD-10-CM | POA: Diagnosis not present

## 2021-09-12 DIAGNOSIS — R339 Retention of urine, unspecified: Secondary | ICD-10-CM

## 2021-09-12 DIAGNOSIS — A419 Sepsis, unspecified organism: Secondary | ICD-10-CM | POA: Diagnosis not present

## 2021-09-12 DIAGNOSIS — N1832 Chronic kidney disease, stage 3b: Secondary | ICD-10-CM | POA: Diagnosis not present

## 2021-09-12 DIAGNOSIS — N136 Pyonephrosis: Secondary | ICD-10-CM | POA: Diagnosis not present

## 2021-09-13 ENCOUNTER — Ambulatory Visit (INDEPENDENT_AMBULATORY_CARE_PROVIDER_SITE_OTHER): Payer: Medicare Other

## 2021-09-13 ENCOUNTER — Other Ambulatory Visit: Payer: Self-pay

## 2021-09-13 DIAGNOSIS — R339 Retention of urine, unspecified: Secondary | ICD-10-CM | POA: Diagnosis not present

## 2021-09-13 NOTE — Patient Instructions (Signed)
Foley Catheter Care and Patient Education  Perform catheter care every day.  You can do this while in the shower, but NOT while taking a tub bath.  You will need the following supplies: -mild soap, such as Dove -water -a clean washcloth (not one already used for bathing) or a 4x4 piece of gauze -1 Cath-Secure -night drainage bag -2 alcohol swabs  Was you hands thoroughly with soap and water Using mild soap and water, clan your genital area Men should retract the foreskin, if needed, and clean the area, including the penis Women should separate the labia, and clean the area from front to back  3. Clean your urinary opening, which is where the catheter enters your body. 4. Clean the catheter from where it enters your body and then down, away from your body.  Hold the catheter at the point it enters your body so that you don't put tension on it. 5. Rinse the area well and dry it gently.  Changing the drainage bag You will change your drainage bag twice a day -in the morning after you shower, change he night bag to the leg bag -at night before you go to bed, change the leg bag to the night bag  Wash your hands thoroughly with soap and water Empty the urine from the drainage bag into the toilet before you change it Pinch off the catheter with your fingers and disconnect the used bag Wipe the end of the catheter using an alcohol pad Wipe the connector on the bag using the second alcohol pad Connect the clean bag to the catheter and release your finger pinch Check all connections.  Straighten any kinks or twits in the tubing  Caring for the Leg bag -always wear the leg bag below your knee.  This will help it drain. -keep the leg bag secure with the velcro straps.  If the straps leave a mark on your leg they are to tight and should be loosened.  Leaving the straps too tight can decrease you circulation and lead to blood clots. -empty the leg bag through the spout at the bottom every  2-4 hours as needed.  Do not let the bag become completely full. -do not lie down for longer than 2 hours while you are wearing the leg bag.  Caring for the Night Bag -always keep the night bag below the level of your bladder . -to hang your night bag while you sleep, place a clean plastic bag inside of a wastebasket.  Hang the night bag on the inside of the wastebasket.  Cleaning the Drainage bag Wash you hands thoroughly with soap and water. Rinse the equipment with cool water.  Do not use hot water it can damage the plastic equipment. Was the equipment with a mild liquid detergent (ivory) and rinse with cool water. To decrease odor, fill the bag halfway with a mixture of 1 part vinegar and 3 parts water. Shake the bag and let it sit for 15 minutes. Rinse the bag with cool water and hang it up to dry.  Preventing infection -keep the drainage bag below the level of your bladder and off the floor at all times. -keep the catheter secured to your thigh to prevent it from moving. -do not lie on or block the flow of urine in the tubing. -shower daily to keep the catheter clean. -clean your hands before and after touching the catheter or bag. -the spout of the drainage bag should never touch the side of   the toilet or any emptying container.  Special Points -You may see some blood or urine around where the catheter enters your body, especially when walking or having a bowel movement.  This is normal, as long as there is urine draining into the drainage bag.  If you experience significant leakage around  catheter tube where it enters your urethra possibly associated with lower abdominal cramping you could be having a bladder spasm.  Please notify your doctor and we can prescribe you a medication to improve your symptoms. -drink 1-2 glasses of liquids every 2 hours while you're awake.  Call your doctor immediately if -your catheter comes out, do not try to replace it yourself -you have  temperature of 101F (38.8C) or higher -you have bright red blood or large blood clots in your urine -you have abdominal pain and no urine in your catheter bag   

## 2021-09-13 NOTE — Progress Notes (Signed)
Suprapubic Cath Change  Patient is present today for a suprapubic catheter change due to urinary retention.  60ml of water was drained from the balloon, a 24FR foley cath was removed from the tract with out difficulty.  Site was cleaned and prepped in a sterile fashion with betadine.  A 24FR foley cath was replaced into the tract no complications were noted. Urine return was noted, 10 ml of sterile water was inflated into the balloon and a leg bag was attached for drainage.  Patient tolerated well. A night bag was given to patient and proper instruction was given on how to switch bags.    Performed by: Holle Sprick LPN  Follow up: Keep next scheduled OV

## 2021-09-14 DIAGNOSIS — I420 Dilated cardiomyopathy: Secondary | ICD-10-CM | POA: Diagnosis not present

## 2021-09-14 DIAGNOSIS — Z9181 History of falling: Secondary | ICD-10-CM | POA: Diagnosis not present

## 2021-09-14 DIAGNOSIS — G35 Multiple sclerosis: Secondary | ICD-10-CM | POA: Diagnosis not present

## 2021-09-14 DIAGNOSIS — J8410 Pulmonary fibrosis, unspecified: Secondary | ICD-10-CM | POA: Diagnosis not present

## 2021-09-14 DIAGNOSIS — I739 Peripheral vascular disease, unspecified: Secondary | ICD-10-CM | POA: Diagnosis not present

## 2021-09-14 DIAGNOSIS — E876 Hypokalemia: Secondary | ICD-10-CM | POA: Diagnosis not present

## 2021-09-14 DIAGNOSIS — N1330 Unspecified hydronephrosis: Secondary | ICD-10-CM | POA: Diagnosis not present

## 2021-09-14 DIAGNOSIS — G8929 Other chronic pain: Secondary | ICD-10-CM | POA: Diagnosis not present

## 2021-09-14 DIAGNOSIS — E785 Hyperlipidemia, unspecified: Secondary | ICD-10-CM | POA: Diagnosis not present

## 2021-09-14 DIAGNOSIS — G4733 Obstructive sleep apnea (adult) (pediatric): Secondary | ICD-10-CM | POA: Diagnosis not present

## 2021-09-14 DIAGNOSIS — Z79891 Long term (current) use of opiate analgesic: Secondary | ICD-10-CM | POA: Diagnosis not present

## 2021-09-14 DIAGNOSIS — Z9981 Dependence on supplemental oxygen: Secondary | ICD-10-CM | POA: Diagnosis not present

## 2021-09-14 DIAGNOSIS — Z436 Encounter for attention to other artificial openings of urinary tract: Secondary | ICD-10-CM | POA: Diagnosis not present

## 2021-09-14 DIAGNOSIS — I69354 Hemiplegia and hemiparesis following cerebral infarction affecting left non-dominant side: Secondary | ICD-10-CM | POA: Diagnosis not present

## 2021-09-14 DIAGNOSIS — E039 Hypothyroidism, unspecified: Secondary | ICD-10-CM | POA: Diagnosis not present

## 2021-09-14 DIAGNOSIS — D631 Anemia in chronic kidney disease: Secondary | ICD-10-CM | POA: Diagnosis not present

## 2021-09-14 DIAGNOSIS — I2510 Atherosclerotic heart disease of native coronary artery without angina pectoris: Secondary | ICD-10-CM | POA: Diagnosis not present

## 2021-09-14 DIAGNOSIS — N1832 Chronic kidney disease, stage 3b: Secondary | ICD-10-CM | POA: Diagnosis not present

## 2021-09-14 DIAGNOSIS — J449 Chronic obstructive pulmonary disease, unspecified: Secondary | ICD-10-CM | POA: Diagnosis not present

## 2021-09-14 DIAGNOSIS — I130 Hypertensive heart and chronic kidney disease with heart failure and stage 1 through stage 4 chronic kidney disease, or unspecified chronic kidney disease: Secondary | ICD-10-CM | POA: Diagnosis not present

## 2021-09-14 DIAGNOSIS — I5032 Chronic diastolic (congestive) heart failure: Secondary | ICD-10-CM | POA: Diagnosis not present

## 2021-09-14 DIAGNOSIS — R1312 Dysphagia, oropharyngeal phase: Secondary | ICD-10-CM | POA: Diagnosis not present

## 2021-09-14 DIAGNOSIS — Z87440 Personal history of urinary (tract) infections: Secondary | ICD-10-CM | POA: Diagnosis not present

## 2021-09-14 DIAGNOSIS — J9611 Chronic respiratory failure with hypoxia: Secondary | ICD-10-CM | POA: Diagnosis not present

## 2021-09-14 DIAGNOSIS — Z435 Encounter for attention to cystostomy: Secondary | ICD-10-CM | POA: Diagnosis not present

## 2021-09-19 DIAGNOSIS — I130 Hypertensive heart and chronic kidney disease with heart failure and stage 1 through stage 4 chronic kidney disease, or unspecified chronic kidney disease: Secondary | ICD-10-CM | POA: Diagnosis not present

## 2021-09-19 DIAGNOSIS — J449 Chronic obstructive pulmonary disease, unspecified: Secondary | ICD-10-CM | POA: Diagnosis not present

## 2021-09-19 DIAGNOSIS — N1832 Chronic kidney disease, stage 3b: Secondary | ICD-10-CM | POA: Diagnosis not present

## 2021-09-19 DIAGNOSIS — N1330 Unspecified hydronephrosis: Secondary | ICD-10-CM | POA: Diagnosis not present

## 2021-09-19 DIAGNOSIS — D631 Anemia in chronic kidney disease: Secondary | ICD-10-CM | POA: Diagnosis not present

## 2021-09-19 DIAGNOSIS — I5032 Chronic diastolic (congestive) heart failure: Secondary | ICD-10-CM | POA: Diagnosis not present

## 2021-09-27 DIAGNOSIS — J449 Chronic obstructive pulmonary disease, unspecified: Secondary | ICD-10-CM | POA: Diagnosis not present

## 2021-09-27 DIAGNOSIS — I5032 Chronic diastolic (congestive) heart failure: Secondary | ICD-10-CM | POA: Diagnosis not present

## 2021-09-27 DIAGNOSIS — D631 Anemia in chronic kidney disease: Secondary | ICD-10-CM | POA: Diagnosis not present

## 2021-09-27 DIAGNOSIS — I130 Hypertensive heart and chronic kidney disease with heart failure and stage 1 through stage 4 chronic kidney disease, or unspecified chronic kidney disease: Secondary | ICD-10-CM | POA: Diagnosis not present

## 2021-09-27 DIAGNOSIS — N1832 Chronic kidney disease, stage 3b: Secondary | ICD-10-CM | POA: Diagnosis not present

## 2021-09-27 DIAGNOSIS — N1330 Unspecified hydronephrosis: Secondary | ICD-10-CM | POA: Diagnosis not present

## 2021-10-01 DIAGNOSIS — E0590 Thyrotoxicosis, unspecified without thyrotoxic crisis or storm: Secondary | ICD-10-CM | POA: Diagnosis not present

## 2021-10-01 DIAGNOSIS — R7303 Prediabetes: Secondary | ICD-10-CM | POA: Diagnosis not present

## 2021-10-04 DIAGNOSIS — M79601 Pain in right arm: Secondary | ICD-10-CM | POA: Diagnosis not present

## 2021-10-04 DIAGNOSIS — N1330 Unspecified hydronephrosis: Secondary | ICD-10-CM | POA: Diagnosis not present

## 2021-10-04 DIAGNOSIS — E0590 Thyrotoxicosis, unspecified without thyrotoxic crisis or storm: Secondary | ICD-10-CM | POA: Diagnosis not present

## 2021-10-04 DIAGNOSIS — D631 Anemia in chronic kidney disease: Secondary | ICD-10-CM | POA: Diagnosis not present

## 2021-10-04 DIAGNOSIS — N319 Neuromuscular dysfunction of bladder, unspecified: Secondary | ICD-10-CM | POA: Diagnosis not present

## 2021-10-04 DIAGNOSIS — I130 Hypertensive heart and chronic kidney disease with heart failure and stage 1 through stage 4 chronic kidney disease, or unspecified chronic kidney disease: Secondary | ICD-10-CM | POA: Diagnosis not present

## 2021-10-04 DIAGNOSIS — Z23 Encounter for immunization: Secondary | ICD-10-CM | POA: Diagnosis not present

## 2021-10-04 DIAGNOSIS — R7303 Prediabetes: Secondary | ICD-10-CM | POA: Diagnosis not present

## 2021-10-04 DIAGNOSIS — I10 Essential (primary) hypertension: Secondary | ICD-10-CM | POA: Diagnosis not present

## 2021-10-04 DIAGNOSIS — I5032 Chronic diastolic (congestive) heart failure: Secondary | ICD-10-CM | POA: Diagnosis not present

## 2021-10-04 DIAGNOSIS — N184 Chronic kidney disease, stage 4 (severe): Secondary | ICD-10-CM | POA: Diagnosis not present

## 2021-10-04 DIAGNOSIS — N1832 Chronic kidney disease, stage 3b: Secondary | ICD-10-CM | POA: Diagnosis not present

## 2021-10-04 DIAGNOSIS — J449 Chronic obstructive pulmonary disease, unspecified: Secondary | ICD-10-CM | POA: Diagnosis not present

## 2021-10-08 ENCOUNTER — Ambulatory Visit: Payer: Medicare Other

## 2021-10-10 ENCOUNTER — Ambulatory Visit (HOSPITAL_COMMUNITY)
Admission: RE | Admit: 2021-10-10 | Discharge: 2021-10-10 | Disposition: A | Discharge status: Home / Self Care | Payer: Medicare Other | Source: Ambulatory Visit | Attending: Urology | Admitting: Urology

## 2021-10-10 ENCOUNTER — Other Ambulatory Visit: Payer: Self-pay

## 2021-10-10 DIAGNOSIS — R109 Unspecified abdominal pain: Secondary | ICD-10-CM | POA: Diagnosis not present

## 2021-10-10 DIAGNOSIS — N200 Calculus of kidney: Secondary | ICD-10-CM | POA: Insufficient documentation

## 2021-10-10 DIAGNOSIS — I700 Atherosclerosis of aorta: Secondary | ICD-10-CM | POA: Diagnosis not present

## 2021-10-11 DIAGNOSIS — I130 Hypertensive heart and chronic kidney disease with heart failure and stage 1 through stage 4 chronic kidney disease, or unspecified chronic kidney disease: Secondary | ICD-10-CM | POA: Diagnosis not present

## 2021-10-11 DIAGNOSIS — J449 Chronic obstructive pulmonary disease, unspecified: Secondary | ICD-10-CM | POA: Diagnosis not present

## 2021-10-11 DIAGNOSIS — D631 Anemia in chronic kidney disease: Secondary | ICD-10-CM | POA: Diagnosis not present

## 2021-10-11 DIAGNOSIS — N1832 Chronic kidney disease, stage 3b: Secondary | ICD-10-CM | POA: Diagnosis not present

## 2021-10-11 DIAGNOSIS — N1330 Unspecified hydronephrosis: Secondary | ICD-10-CM | POA: Diagnosis not present

## 2021-10-11 DIAGNOSIS — I5032 Chronic diastolic (congestive) heart failure: Secondary | ICD-10-CM | POA: Diagnosis not present

## 2021-10-13 NOTE — Progress Notes (Incomplete)
History of Present Illness: here for followup of NGB/Lt hydro/h/o urolithiasis  He has a neurogenic bladder.  He has had a longstanding suprapubic tube.  Currently, output is not great as he has an atrophic kidney and a left percutaneous nephrostomy.  He still has bladder spasms and is on Myrbetriq 25 mg a day.  He has recurrent urolithiasis, left renal unit.  Because of multiple ureteral stones and longstanding hydronephrosis, he had a left-sided nephrostomy tube placed in May, 2022.  He states that it hurts when they change it.  Most recent change was October 15.  CT abdomen and pelvis for follow-up of urolithiasis was performed the day before revealing hydronephrosis and misplaced percutaneous tube.  He did have a 73mm left renal pelvic stone at that time.  Past Medical History:  Diagnosis Date   Anemia    Arthritis    Back pain, chronic    Bilateral carotid bruits    C. difficile colitis 09/2011   CAD (coronary artery disease)    Carotid artery stenosis    Cerebrovascular disease    Cerebrovascular disease 08/17/2018   Colon polyps    Complication of cystostomy catheter, initial encounter ([place]) 04/20/2020   Dyslipidemia    Dysphagia 10/07/2011   Encephalopathy    Gait disorder    HA (headache)    High grade dysplasia in colonic adenoma 09/2005   History of kidney stones    HTN (hypertension), malignant 10/06/2011   Hypernatremia    Hypokalemia    Hypothyroidism 10/08/2011   Insomnia    Junctional rhythm    Kidney stones    MS (multiple sclerosis) (HCC)    Neuromuscular disorder (HCC)    MS   OSA (obstructive sleep apnea)    Paroxysmal atrial tachycardia ([place])    Peripheral vascular disease ([place])    Pneumonia 4 yrs ago   Pulmonary fibrosis ([place]) 10/06/2011   Pulmonary nodule 10/08/2011   PVD (peripheral vascular disease) ([place])    Sacral ulcer (HCC)    Sleep apnea    cannot tolerate   Stroke ([place])    left sided weakness   Suprapubic catheter ([place])    TIA (transient ischemic  attack)    Tremors of nervous system 10/08/2011   Urinary tract infection    Ventral hernia without obstruction or gangrene     Large 8X9cm ventral hernia with loss of domain. CT reads report as diastasis recti with herniation or diastasis recti.  Dr. Constance Haw, Surgery, reviewed CT with radiology and there is herniation with only hernia sac or peritoneum over the bowel and large separation of the rectus muslce (i.e. diastasis recti aka loss of domain).  No surgical intervention recommended given size, age, and health.     Past Surgical History:  Procedure Laterality Date   APPENDECTOMY  09/2005   at time of left hemicolectomy   BACK SURGERY  1976/1979   lower   BILIARY DILATION N/A 03/03/2020   Procedure: BILIARY DILATION;  Surgeon: Rogene Houston, MD;  [facility];  Service: Endoscopy;  Laterality: N/A;   CATARACT EXTRACTION W/PHACO Right 03/08/2018   Procedure: CATARACT EXTRACTION PHACO AND INTRAOCULAR LENS PLACEMENT RIGHT EYE;  Surgeon: Tonny Branch, MD;  Location: [facility];  Service: Ophthalmology;  Laterality: Right;  CDE: 8.86   CATARACT EXTRACTION W/PHACO Left 04/05/2018   Procedure: CATARACT EXTRACTION PHACO AND INTRAOCULAR LENS PLACEMENT (IOC);  Surgeon: Tonny Branch, MD;  Location: [facility];  Service: Ophthalmology;  Laterality: Left;  CDE: 7.36   CENTRAL  LINE INSERTION-TUNNELED Right 09/11/2020   Procedure: PLACEMENT OF TUNNELED CENTRAL LINE INTO JUGULAR VEIN;  Surgeon: Virl Cagey, MD;  Location: [facility];  Service: General;  Laterality: Right;   CHOLECYSTECTOMY     Dr. Tamala Julian   COLON SURGERY  09/2005   Fleishman: four tubular adenomas, large adenomatous polyp with HIGH GRADE dysplasia   COLONOSCOPY  11/2004   Dr. Sharol Roussel sessile polyp splenic flexure, 61mm sessile polyp desc colon, tubulovillous adenoma (bx not removed)   COLONOSCOPY  01/2005   poor prep, polyp could not be found   COLONOSCOPY  05/2005   with EMR, polypectomy Dr. Olegario Messier, bx showed high grade  dysplasia, partially resected   COLONOSCOPY  09/2005   Dr. Arsenio Loader, [place] ink tattooing, four villous colon polyp (3 had been missed on previous colonoscopies due to limitations of procedures   COLONOSCOPY  09/2006   normal TI, no polyps   COLONOSCOPY  10/2007   Dr. Imogene Burn distal mammillations, benign bx, normal TI, random bx neg for microscopic colitis   CYSTOSCOPY W/ URETERAL STENT PLACEMENT Left 09/05/2020   Procedure: CYSTOSCOPY WITH RETROGRADE PYELOGRAM/URETERAL STENT PLACEMENT;  Surgeon: Ardis Hughs, MD;  [facility];  Service: Urology;  Laterality: Left;   CYSTOSCOPY WITH LITHOLAPAXY N/A 07/27/2018   Procedure: CYSTOSCOPY WITH LITHOLAPAXY VIA  SUPRAPUBIC TUBE;  Surgeon: Franchot Gallo, MD;  [facility]: [facility];  Service: Urology;  Laterality: N/A;   CYSTOSCOPY WITH RETROGRADE PYELOGRAM, URETEROSCOPY AND STENT PLACEMENT Left 06/09/2017   Procedure: CYSTOSCOPY WITH LEFT RETROGRADE PYELOGRAM, LEFT URETEROSCOPY, LEFT URETEROSCOPIC STONE EXTRACTION, LEFT URETERAL STENT PLACEMENT;  Surgeon: Franchot Gallo, MD;  [facility];  Service: Urology;  Laterality: Left;   CYSTOSCOPY/URETEROSCOPY/HOLMIUM LASER/STENT PLACEMENT Left 05/22/2020   Procedure: CYSTOSCOPY/URETEROSCOPY/STENT PLACEMENT;  Surgeon: Franchot Gallo, MD;  [facility];  Service: Urology;  Laterality: Left;   ERCP N/A 03/03/2020   Procedure: ENDOSCOPIC RETROGRADE CHOLANGIOPANCREATOGRAPHY (ERCP);  Surgeon: Rogene Houston, MD;  [facility];  Service: Endoscopy;  Laterality: N/A;   ERCP N/A 04/20/2020   Procedure: ENDOSCOPIC RETROGRADE CHOLANGIOPANCREATOGRAPHY (ERCP);  Surgeon: Rogene Houston, MD;  [facility];  Service: Endoscopy;  Laterality: N/A;  to be done at 7:30am in [place] N/A 04/20/2020   Procedure: STENT REMOVAL;  Surgeon: Rogene Houston, MD;  [facility];  Service: Endoscopy;  Laterality: N/A;   INGUINAL HERNIA REPAIR  1971    bilateral   INSERTION OF SUPRAPUBIC CATHETER  06/09/2017   Procedure: EXCHANGE OF SUPRAPUBIC CATHETER;  Surgeon: Franchot Gallo, MD;  [facility]: [facility];  Service: Urology;;   INSERTION OF SUPRAPUBIC CATHETER  05/22/2020   Procedure: SUPRAPUBIC CATHETER EXCHANGE;  Surgeon: Franchot Gallo, MD;  [facility]: [facility];  Service: Urology;;   IR NEPHROSTOMY EXCHANGE LEFT  03/25/2021   IR NEPHROSTOMY EXCHANGE LEFT  05/20/2021   IR NEPHROSTOMY EXCHANGE LEFT  07/13/2021   IR NEPHROSTOMY EXCHANGE LEFT  09/09/2021   IR NEPHROSTOMY PLACEMENT LEFT  05/26/2017   IR NEPHROSTOMY PLACEMENT LEFT  05/02/2020   IR NEPHROSTOMY PLACEMENT LEFT  02/15/2021   IR URETERAL STENT PERC REMOVAL MOD SED  03/25/2021   KIDNEY STONE SURGERY  09/13/2015   LITHOTRIPSY N/A 03/03/2020   Procedure: MECHANICAL LITHOTRIPSY WITH REMOVAL OF MULTIPLE STONE FRAGMENTS;  Surgeon: Rogene Houston, MD;  [facility];  Service: Endoscopy;  Laterality: N/A;   NEPHROLITHOTOMY Left 09/13/2015   Procedure: LEFT PERCUTANEOUS NEPHROLITHOTOMY ;  Surgeon: Franchot Gallo, MD;  [facility]:  [facility];  Service: Urology;  Laterality: Left;   NEPHROSTOMY TUBE REMOVAL  05/22/2020   Procedure: NEPHROSTOMY TUBE REMOVAL;  Surgeon: Franchot Gallo, MD;  Location: [facility];  Service: Urology;;   [facility] REMOVAL Right 09/20/2020   Procedure: MINOR REMOVAL CENTRAL LINE;  Surgeon: Virl Cagey, MD;  Location: [facility];  Service: General;  Laterality: Right;  Pt to arrive at 7:30am for procedure   REMOVAL OF STONES N/A 04/20/2020   Procedure: REMOVAL OF STONES;  Surgeon: Rogene Houston, MD;  [facility];  Service: Endoscopy;  Laterality: N/A;   SPHINCTEROTOMY  03/03/2020   Procedure: BILLARY SPHINCTEROTOMY;  Surgeon: Rogene Houston, MD;  [facility];  Service: Endoscopy;;   SUPRAPUBIC CATHETER INSERTION      Home Medications:  Allergies as of 10/15/2021       Reactions   Tetracyclines & Related Anaphylaxis, Rash    Ciprofloxacin    Trouble swallowing unknown reaction according to wife         Medication List        Accurate as of October 13, 2021  4:57 PM. If you have any questions, ask your nurse or doctor.          acetaminophen 325 MG tablet Commonly known as: TYLENOL Take 650 mg by mouth every 6 (six) hours as needed.   amLODipine 10 MG tablet Commonly known as: NORVASC Take 10 mg by mouth daily.   diazepam 5 MG tablet Commonly known as: VALIUM TAKE 1 TABLET EVERY 12 HOURS AS NEEDED FOR MUSCLE SPASMS   HYDROcodone-acetaminophen 7.5-325 MG tablet Commonly known as: NORCO Take 1 tablet by mouth 2 (two) times daily. Max APAP 3 GM IN 24 HOURS FROM ALL SOURCES What changed:  when to take this additional instructions   levothyroxine 75 MCG tablet Commonly known as: SYNTHROID Take 75 mcg by mouth daily.   OXYGEN Inhale 1 L into the lungs every evening.   potassium chloride SA 20 MEQ tablet Commonly known as: KLOR-CON M Take 20 mEq by mouth daily.        Allergies:  Allergies  Allergen Reactions   Tetracyclines & Related Anaphylaxis and Rash   Ciprofloxacin     Trouble swallowing unknown reaction according to wife     Family History  Problem Relation Age of Onset   Cirrhosis Brother        etoh   Stroke Mother 57   Coronary artery disease Father 26   Heart attack Brother    Cancer Sister    Multiple sclerosis Other    Colon cancer Neg Hx     Social History:  reports that he quit smoking about 33 years ago. His smoking use included cigarettes. He has a 25.00 pack-year smoking history. He has never used smokeless tobacco. He reports that he does not drink alcohol and does not use drugs.  ROS: A complete review of systems was performed.  All systems are negative except for pertinent findings as noted.  Physical Exam:  Vital signs in last 24 hours: There were no vitals taken for this visit. Constitutional:  Alert and oriented, No acute  distress Cardiovascular: Regular rate  Respiratory: Normal respiratory effort GI: Abdomen is soft, nontender, nondistended, no abdominal masses. No CVAT.  Genitourinary: Normal male phallus, testes are descended bilaterally and non-tender and without masses, scrotum is normal in appearance without lesions or masses, perineum is normal on inspection. Lymphatic: No lymphadenopathy Neurologic: Grossly intact, no focal deficits Psychiatric: Normal  mood and affect  I have reviewed prior pt notes  I have reviewed notes from referring/previous physicians  I have reviewed urinalysis results  I have independently reviewed prior imaging  I have reviewed prior PSA results  I have reviewed prior urine culture   Impression/Assessment:  ***  Plan:  ***

## 2021-10-14 DIAGNOSIS — G8929 Other chronic pain: Secondary | ICD-10-CM | POA: Diagnosis not present

## 2021-10-14 DIAGNOSIS — D631 Anemia in chronic kidney disease: Secondary | ICD-10-CM | POA: Diagnosis not present

## 2021-10-14 DIAGNOSIS — Z9981 Dependence on supplemental oxygen: Secondary | ICD-10-CM | POA: Diagnosis not present

## 2021-10-14 DIAGNOSIS — J9611 Chronic respiratory failure with hypoxia: Secondary | ICD-10-CM | POA: Diagnosis not present

## 2021-10-14 DIAGNOSIS — E785 Hyperlipidemia, unspecified: Secondary | ICD-10-CM | POA: Diagnosis not present

## 2021-10-14 DIAGNOSIS — Z79891 Long term (current) use of opiate analgesic: Secondary | ICD-10-CM | POA: Diagnosis not present

## 2021-10-14 DIAGNOSIS — Z435 Encounter for attention to cystostomy: Secondary | ICD-10-CM | POA: Diagnosis not present

## 2021-10-14 DIAGNOSIS — Z436 Encounter for attention to other artificial openings of urinary tract: Secondary | ICD-10-CM | POA: Diagnosis not present

## 2021-10-14 DIAGNOSIS — E039 Hypothyroidism, unspecified: Secondary | ICD-10-CM | POA: Diagnosis not present

## 2021-10-14 DIAGNOSIS — Z9181 History of falling: Secondary | ICD-10-CM | POA: Diagnosis not present

## 2021-10-14 DIAGNOSIS — I739 Peripheral vascular disease, unspecified: Secondary | ICD-10-CM | POA: Diagnosis not present

## 2021-10-14 DIAGNOSIS — J449 Chronic obstructive pulmonary disease, unspecified: Secondary | ICD-10-CM | POA: Diagnosis not present

## 2021-10-14 DIAGNOSIS — I5032 Chronic diastolic (congestive) heart failure: Secondary | ICD-10-CM | POA: Diagnosis not present

## 2021-10-14 DIAGNOSIS — J8410 Pulmonary fibrosis, unspecified: Secondary | ICD-10-CM | POA: Diagnosis not present

## 2021-10-14 DIAGNOSIS — G4733 Obstructive sleep apnea (adult) (pediatric): Secondary | ICD-10-CM | POA: Diagnosis not present

## 2021-10-14 DIAGNOSIS — I69354 Hemiplegia and hemiparesis following cerebral infarction affecting left non-dominant side: Secondary | ICD-10-CM | POA: Diagnosis not present

## 2021-10-14 DIAGNOSIS — I130 Hypertensive heart and chronic kidney disease with heart failure and stage 1 through stage 4 chronic kidney disease, or unspecified chronic kidney disease: Secondary | ICD-10-CM | POA: Diagnosis not present

## 2021-10-14 DIAGNOSIS — I420 Dilated cardiomyopathy: Secondary | ICD-10-CM | POA: Diagnosis not present

## 2021-10-14 DIAGNOSIS — E876 Hypokalemia: Secondary | ICD-10-CM | POA: Diagnosis not present

## 2021-10-14 DIAGNOSIS — R1312 Dysphagia, oropharyngeal phase: Secondary | ICD-10-CM | POA: Diagnosis not present

## 2021-10-14 DIAGNOSIS — N1832 Chronic kidney disease, stage 3b: Secondary | ICD-10-CM | POA: Diagnosis not present

## 2021-10-14 DIAGNOSIS — G35 Multiple sclerosis: Secondary | ICD-10-CM | POA: Diagnosis not present

## 2021-10-14 DIAGNOSIS — Z87440 Personal history of urinary (tract) infections: Secondary | ICD-10-CM | POA: Diagnosis not present

## 2021-10-14 DIAGNOSIS — N1330 Unspecified hydronephrosis: Secondary | ICD-10-CM | POA: Diagnosis not present

## 2021-10-14 DIAGNOSIS — I2510 Atherosclerotic heart disease of native coronary artery without angina pectoris: Secondary | ICD-10-CM | POA: Diagnosis not present

## 2021-10-15 ENCOUNTER — Other Ambulatory Visit: Payer: Self-pay

## 2021-10-15 ENCOUNTER — Telehealth: Payer: Self-pay

## 2021-10-15 ENCOUNTER — Ambulatory Visit: Payer: Medicare Other | Admitting: Urology

## 2021-10-15 ENCOUNTER — Ambulatory Visit (INDEPENDENT_AMBULATORY_CARE_PROVIDER_SITE_OTHER): Payer: Medicare Other | Admitting: Urology

## 2021-10-15 DIAGNOSIS — N200 Calculus of kidney: Secondary | ICD-10-CM

## 2021-10-15 DIAGNOSIS — R339 Retention of urine, unspecified: Secondary | ICD-10-CM

## 2021-10-15 DIAGNOSIS — N261 Atrophy of kidney (terminal): Secondary | ICD-10-CM

## 2021-10-15 DIAGNOSIS — N1330 Unspecified hydronephrosis: Secondary | ICD-10-CM

## 2021-10-15 DIAGNOSIS — N312 Flaccid neuropathic bladder, not elsewhere classified: Secondary | ICD-10-CM

## 2021-10-15 DIAGNOSIS — N390 Urinary tract infection, site not specified: Secondary | ICD-10-CM

## 2021-10-15 NOTE — Telephone Encounter (Signed)
Dr. [name] will do a telephone visit and he can come in for NV for cath change.

## 2021-10-15 NOTE — Telephone Encounter (Signed)
Patient's wife had surgery and cannot bring pt to today's appt.  Wants to know if he can have a telephone visit and come in to have the cath change with a nurse (nurse visit).  Please advise.  Call back:  [phone]   Thanks, Helene Kelp

## 2021-10-15 NOTE — Progress Notes (Signed)
History of Present Illness: Here for follow-up of urolithiasis.  Jordan Ward has a neurogenic bladder.  Jordan Ward has had a longstanding suprapubic tube.  Currently, output is not great as Jordan Ward has an atrophic kidney and a left percutaneous nephrostomy.  Jordan Ward still has bladder spasms and is on Myrbetriq 25 mg a day.  Jordan Ward has recurrent urolithiasis, left renal unit.  Because of multiple ureteral stones and longstanding hydronephrosis, Jordan Ward had a left-sided nephrostomy tube placed in May, 2022.  Jordan Ward states that it hurts when they change it.  Most recent change was 12.12.2022.  CT abdomen and pelvis for follow-up of urolithiasis was performed the day before revealing hydronephrosis and misplaced percutaneous tube.  Jordan Ward did have a 31mm left renal pelvic stone at that time.  I spoke with him by way of telehealth today.  Jordan Ward had recent CT scan which revealed a persistent 15 mm left renal pelvic stone.  Jordan Ward has had no recent left flank pain except for recent passage of small stone fragments through his nephrostomy tube.  At that time, Jordan Ward had output of more urine from his suprapubic tube.  Jordan Ward does have a nonfunctioning, atrophic right kidney.  Past Medical History:  Diagnosis Date   Anemia    Arthritis    Back pain, chronic    Bilateral carotid bruits    C. difficile colitis 09/2011   CAD (coronary artery disease)    Carotid artery stenosis    Cerebrovascular disease    Cerebrovascular disease 08/17/2018   Colon polyps    Complication of cystostomy catheter, initial encounter ([place]) 04/20/2020   Dyslipidemia    Dysphagia 10/07/2011   Encephalopathy    Gait disorder    HA (headache)    High grade dysplasia in colonic adenoma 09/2005   History of kidney stones    HTN (hypertension), malignant 10/06/2011   Hypernatremia    Hypokalemia    Hypothyroidism 10/08/2011   Insomnia    Junctional rhythm    Kidney stones    MS (multiple sclerosis) (HCC)    Neuromuscular disorder (HCC)    MS   OSA (obstructive sleep apnea)     Paroxysmal atrial tachycardia ([place])    Peripheral vascular disease ([place])    Pneumonia 4 yrs ago   Pulmonary fibrosis ([place]) 10/06/2011   Pulmonary nodule 10/08/2011   PVD (peripheral vascular disease) ([place])    Sacral ulcer (HCC)    Sleep apnea    cannot tolerate   Stroke ([place])    left sided weakness   Suprapubic catheter ([place])    TIA (transient ischemic attack)    Tremors of nervous system 10/08/2011   Urinary tract infection    Ventral hernia without obstruction or gangrene     Large 8X9cm ventral hernia with loss of domain. CT reads report as diastasis recti with herniation or diastasis recti.  Dr. Constance Haw, Surgery, reviewed CT with radiology and there is herniation with only hernia sac or peritoneum over the bowel and large separation of the rectus muslce (i.e. diastasis recti aka loss of domain).  No surgical intervention recommended given size, age, and health.     Past Surgical History:  Procedure Laterality Date   APPENDECTOMY  09/2005   at time of left hemicolectomy   BACK SURGERY  1976/1979   lower   BILIARY DILATION N/A 03/03/2020   Procedure: BILIARY DILATION;  Surgeon: Rogene Houston, MD;  [facility];  Service: Endoscopy;  Laterality: N/A;   CATARACT EXTRACTION W/PHACO Right 03/08/2018  Procedure: CATARACT EXTRACTION PHACO AND INTRAOCULAR LENS PLACEMENT RIGHT EYE;  Surgeon: Tonny Branch, MD;  [facility]: [facility];  Service: Ophthalmology;  Laterality: Right;  CDE: 8.86   CATARACT EXTRACTION W/PHACO Left 04/05/2018   Procedure: CATARACT EXTRACTION PHACO AND INTRAOCULAR LENS PLACEMENT (IOC);  Surgeon: Tonny Branch, MD;  Location: [facility];  Service: Ophthalmology;  Laterality: Left;  CDE: 7.36   CENTRAL LINE INSERTION-TUNNELED Right 09/11/2020   Procedure: PLACEMENT OF TUNNELED CENTRAL LINE INTO JUGULAR VEIN;  Surgeon: Virl Cagey, MD;  [facility]: [facility];  Service: General;  Laterality: Right;   CHOLECYSTECTOMY     Dr. Tamala Julian   COLON SURGERY  09/2005   Fleishman: four  tubular adenomas, large adenomatous polyp with HIGH GRADE dysplasia   COLONOSCOPY  11/2004   Dr. Sharol Roussel sessile polyp splenic flexure, 46mm sessile polyp desc colon, tubulovillous adenoma (bx not removed)   COLONOSCOPY  01/2005   poor prep, polyp could not be found   COLONOSCOPY  05/2005   with EMR, polypectomy Dr. Olegario Messier, bx showed high grade dysplasia, partially resected   COLONOSCOPY  09/2005   Dr. Arsenio Loader, [place] ink tattooing, four villous colon polyp (3 had been missed on previous colonoscopies due to limitations of procedures   COLONOSCOPY  09/2006   normal TI, no polyps   COLONOSCOPY  10/2007   Dr. Imogene Burn distal mammillations, benign bx, normal TI, random bx neg for microscopic colitis   CYSTOSCOPY W/ URETERAL STENT PLACEMENT Left 09/05/2020   Procedure: CYSTOSCOPY WITH RETROGRADE PYELOGRAM/URETERAL STENT PLACEMENT;  Surgeon: Ardis Hughs, MD;  [facility]: [facility];  Service: Urology;  Laterality: Left;   CYSTOSCOPY WITH LITHOLAPAXY N/A 07/27/2018   Procedure: CYSTOSCOPY WITH LITHOLAPAXY VIA  SUPRAPUBIC TUBE;  Surgeon: Franchot Gallo, MD;  [facility]: [facility];  Service: Urology;  Laterality: N/A;   CYSTOSCOPY WITH RETROGRADE PYELOGRAM, URETEROSCOPY AND STENT PLACEMENT Left 06/09/2017   Procedure: CYSTOSCOPY WITH LEFT RETROGRADE PYELOGRAM, LEFT URETEROSCOPY, LEFT URETEROSCOPIC STONE EXTRACTION, LEFT URETERAL STENT PLACEMENT;  Surgeon: Franchot Gallo, MD;  [facility]: [facility];  Service: Urology;  Laterality: Left;   CYSTOSCOPY/URETEROSCOPY/HOLMIUM LASER/STENT PLACEMENT Left 05/22/2020   Procedure: CYSTOSCOPY/URETEROSCOPY/STENT PLACEMENT;  Surgeon: Franchot Gallo, MD;  [facility]: [facility];  Service: Urology;  Laterality: Left;   ERCP N/A 03/03/2020   Procedure: ENDOSCOPIC RETROGRADE CHOLANGIOPANCREATOGRAPHY (ERCP);  Surgeon: Rogene Houston, MD;  [facility];  Service: Endoscopy;  Laterality: N/A;   ERCP N/A 04/20/2020   Procedure: ENDOSCOPIC RETROGRADE  CHOLANGIOPANCREATOGRAPHY (ERCP);  Surgeon: Rogene Houston, MD;  [facility];  Service: Endoscopy;  Laterality: N/A;  to be done at 7:30am in [place] N/A 04/20/2020   Procedure: STENT REMOVAL;  Surgeon: Rogene Houston, MD;  [facility];  Service: Endoscopy;  Laterality: N/A;   INGUINAL HERNIA REPAIR  1971   bilateral   INSERTION OF SUPRAPUBIC CATHETER  06/09/2017   Procedure: EXCHANGE OF SUPRAPUBIC CATHETER;  Surgeon: Franchot Gallo, MD;  [facility]: [facility];  Service: Urology;;   INSERTION OF SUPRAPUBIC CATHETER  05/22/2020   Procedure: SUPRAPUBIC CATHETER EXCHANGE;  Surgeon: Franchot Gallo, MD;  [facility]: [facility];  Service: Urology;;   IR NEPHROSTOMY EXCHANGE LEFT  03/25/2021   IR NEPHROSTOMY EXCHANGE LEFT  05/20/2021   IR NEPHROSTOMY EXCHANGE LEFT  07/13/2021   IR NEPHROSTOMY EXCHANGE LEFT  09/09/2021   IR NEPHROSTOMY PLACEMENT LEFT  05/26/2017   IR NEPHROSTOMY PLACEMENT LEFT  05/02/2020   IR NEPHROSTOMY PLACEMENT LEFT  02/15/2021   IR  URETERAL STENT PERC REMOVAL MOD SED  03/25/2021   KIDNEY STONE SURGERY  09/13/2015   LITHOTRIPSY N/A 03/03/2020   Procedure: MECHANICAL LITHOTRIPSY WITH REMOVAL OF MULTIPLE STONE FRAGMENTS;  Surgeon: Rogene Houston, MD;  [facility];  Service: Endoscopy;  Laterality: N/A;   NEPHROLITHOTOMY Left 09/13/2015   Procedure: LEFT PERCUTANEOUS NEPHROLITHOTOMY ;  Surgeon: Franchot Gallo, MD;  [facility];  Service: Urology;  Laterality: Left;   NEPHROSTOMY TUBE REMOVAL  05/22/2020   Procedure: NEPHROSTOMY TUBE REMOVAL;  Surgeon: Franchot Gallo, MD;  Location: [facility];  Service: Urology;;   [facility] REMOVAL Right 09/20/2020   Procedure: MINOR REMOVAL CENTRAL LINE;  Surgeon: Virl Cagey, MD;  Location: [facility];  Service: General;  Laterality: Right;  Pt to arrive at 7:30am for procedure   REMOVAL OF STONES N/A 04/20/2020   Procedure: REMOVAL OF STONES;  Surgeon: Rogene Houston, MD;   [facility];  Service: Endoscopy;  Laterality: N/A;   SPHINCTEROTOMY  03/03/2020   Procedure: BILLARY SPHINCTEROTOMY;  Surgeon: Rogene Houston, MD;  [facility];  Service: Endoscopy;;   SUPRAPUBIC CATHETER INSERTION      Home Medications:  Allergies as of 10/15/2021       Reactions   Tetracyclines & Related Anaphylaxis, Rash   Ciprofloxacin    Trouble swallowing unknown reaction according to wife         Medication List        Accurate as of October 15, 2021  4:53 PM. If you have any questions, ask your nurse or doctor.          acetaminophen 325 MG tablet Commonly known as: TYLENOL Take 650 mg by mouth every 6 (six) hours as needed.   amLODipine 10 MG tablet Commonly known as: NORVASC Take 10 mg by mouth daily.   diazepam 5 MG tablet Commonly known as: VALIUM TAKE 1 TABLET EVERY 12 HOURS AS NEEDED FOR MUSCLE SPASMS   HYDROcodone-acetaminophen 7.5-325 MG tablet Commonly known as: NORCO Take 1 tablet by mouth 2 (two) times daily. Max APAP 3 GM IN 24 HOURS FROM ALL SOURCES What changed:  when to take this additional instructions   levothyroxine 75 MCG tablet Commonly known as: SYNTHROID Take 75 mcg by mouth daily.   OXYGEN Inhale 1 L into the lungs every evening.   potassium chloride SA 20 MEQ tablet Commonly known as: KLOR-CON M Take 20 mEq by mouth daily.        Allergies:  Allergies  Allergen Reactions   Tetracyclines & Related Anaphylaxis and Rash   Ciprofloxacin     Trouble swallowing unknown reaction according to wife     Family History  Problem Relation Age of Onset   Cirrhosis Brother        etoh   Stroke Mother 69   Coronary artery disease Father 74   Heart attack Brother    Cancer Sister    Multiple sclerosis Other    Colon cancer Neg Hx     Social History:  reports that Jordan Ward quit smoking about 33 years ago. His smoking use included cigarettes. Jordan Ward has a 25.00 pack-year smoking history. Jordan Ward has never used  smokeless tobacco. Jordan Ward reports that Jordan Ward does not drink alcohol and does not use drugs.  ROS: A complete review of systems was performed.  All systems are negative except for pertinent findings as noted.   I have reviewed prior pt notes  I have independently reviewed prior imaging both most  recent and prior CT scans as well as KUB images were reviewed   I have reviewed prior urine culture   Impression/Assessment:  1.  Recurrent urolithiasis, stable left renal pelvic stone at 15 mm, currently asymptomatic  2.  Neurogenic bladder with indwelling suprapubic tube.  3.  Nonfunctioning atrophic right kidney  Plan:  1.  At this point, I do not think we need to intervene with his fairly stable right renal stone  2.  Continue suprapubic and nephrostomy tube changes  3.  I will have KUB scheduled for about 4 months.  I connected with  Szymon Foiles [place] on 10/15/21 by a video enabled telemedicine application and verified that I am speaking with the correct person using two identifiers.   I discussed the limitations of evaluation and management by telemedicine. The patient expressed understanding and agreed to proceed.   Visit was carried out by way of telephone.  22-minute discussion carried out.

## 2021-10-15 NOTE — Telephone Encounter (Signed)
Dr. [name] agreed to telephone visit.

## 2021-10-18 DIAGNOSIS — N1832 Chronic kidney disease, stage 3b: Secondary | ICD-10-CM | POA: Diagnosis not present

## 2021-10-18 DIAGNOSIS — J449 Chronic obstructive pulmonary disease, unspecified: Secondary | ICD-10-CM | POA: Diagnosis not present

## 2021-10-18 DIAGNOSIS — D631 Anemia in chronic kidney disease: Secondary | ICD-10-CM | POA: Diagnosis not present

## 2021-10-18 DIAGNOSIS — N1330 Unspecified hydronephrosis: Secondary | ICD-10-CM | POA: Diagnosis not present

## 2021-10-18 DIAGNOSIS — I130 Hypertensive heart and chronic kidney disease with heart failure and stage 1 through stage 4 chronic kidney disease, or unspecified chronic kidney disease: Secondary | ICD-10-CM | POA: Diagnosis not present

## 2021-10-18 DIAGNOSIS — I5032 Chronic diastolic (congestive) heart failure: Secondary | ICD-10-CM | POA: Diagnosis not present

## 2021-10-22 ENCOUNTER — Other Ambulatory Visit: Payer: Self-pay

## 2021-10-22 ENCOUNTER — Ambulatory Visit (INDEPENDENT_AMBULATORY_CARE_PROVIDER_SITE_OTHER): Payer: Medicare Other

## 2021-10-22 DIAGNOSIS — R339 Retention of urine, unspecified: Secondary | ICD-10-CM

## 2021-10-22 NOTE — Patient Instructions (Signed)
Foley Catheter Care and Patient Education  Perform catheter care every day.  You can do this while in the shower, but NOT while taking a tub bath.  You will need the following supplies: -mild soap, such as Dove -water -a clean washcloth (not one already used for bathing) or a 4x4 piece of gauze -1 Cath-Secure -night drainage bag -2 alcohol swabs  Was you hands thoroughly with soap and water Using mild soap and water, clan your genital area Men should retract the foreskin, if needed, and clean the area, including the penis Women should separate the labia, and clean the area from front to back  3. Clean your urinary opening, which is where the catheter enters your body. 4. Clean the catheter from where it enters your body and then down, away from your body.  Hold the catheter at the point it enters your body so that you don't put tension on it. 5. Rinse the area well and dry it gently.  Changing the drainage bag You will change your drainage bag twice a day -in the morning after you shower, change he night bag to the leg bag -at night before you go to bed, change the leg bag to the night bag  Wash your hands thoroughly with soap and water Empty the urine from the drainage bag into the toilet before you change it Pinch off the catheter with your fingers and disconnect the used bag Wipe the end of the catheter using an alcohol pad Wipe the connector on the bag using the second alcohol pad Connect the clean bag to the catheter and release your finger pinch Check all connections.  Straighten any kinks or twits in the tubing  Caring for the Leg bag -always wear the leg bag below your knee.  This will help it drain. -keep the leg bag secure with the velcro straps.  If the straps leave a mark on your leg they are to tight and should be loosened.  Leaving the straps too tight can decrease you circulation and lead to blood clots. -empty the leg bag through the spout at the bottom every  2-4 hours as needed.  Do not let the bag become completely full. -do not lie down for longer than 2 hours while you are wearing the leg bag.  Caring for the Night Bag -always keep the night bag below the level of your bladder . -to hang your night bag while you sleep, place a clean plastic bag inside of a wastebasket.  Hang the night bag on the inside of the wastebasket.  Cleaning the Drainage bag Wash you hands thoroughly with soap and water. Rinse the equipment with cool water.  Do not use hot water it can damage the plastic equipment. Was the equipment with a mild liquid detergent (ivory) and rinse with cool water. To decrease odor, fill the bag halfway with a mixture of 1 part vinegar and 3 parts water. Shake the bag and let it sit for 15 minutes. Rinse the bag with cool water and hang it up to dry.  Preventing infection -keep the drainage bag below the level of your bladder and off the floor at all times. -keep the catheter secured to your thigh to prevent it from moving. -do not lie on or block the flow of urine in the tubing. -shower daily to keep the catheter clean. -clean your hands before and after touching the catheter or bag. -the spout of the drainage bag should never touch the side of   the toilet or any emptying container.  Special Points -You may see some blood or urine around where the catheter enters your body, especially when walking or having a bowel movement.  This is normal, as long as there is urine draining into the drainage bag.  If you experience significant leakage around  catheter tube where it enters your urethra possibly associated with lower abdominal cramping you could be having a bladder spasm.  Please notify your doctor and we can prescribe you a medication to improve your symptoms. -drink 1-2 glasses of liquids every 2 hours while you're awake.  Call your doctor immediately if -your catheter comes out, do not try to replace it yourself -you have  temperature of 101F (38.8C) or higher -you have bright red blood or large blood clots in your urine -you have abdominal pain and no urine in your catheter bag   

## 2021-10-22 NOTE — Progress Notes (Signed)
Suprapubic Cath Change  Patient is present today for a suprapubic catheter change due to urinary retention.  81ml of water was drained from the balloon, a 24FR foley cath was removed from the tract with out difficulty.  Site was cleaned and prepped in a sterile fashion with betadine.  A 24FR foley cath was replaced into the tract no complications were noted. Urine return was noted, 10 ml of sterile water was inflated into the balloon and a leg bag was attached for drainage.  Patient tolerated well.     Performed by: Davi Rotan LPN  Follow up: Keep next scheduled NV

## 2021-10-25 DIAGNOSIS — D631 Anemia in chronic kidney disease: Secondary | ICD-10-CM | POA: Diagnosis not present

## 2021-10-25 DIAGNOSIS — J449 Chronic obstructive pulmonary disease, unspecified: Secondary | ICD-10-CM | POA: Diagnosis not present

## 2021-10-25 DIAGNOSIS — N1330 Unspecified hydronephrosis: Secondary | ICD-10-CM | POA: Diagnosis not present

## 2021-10-25 DIAGNOSIS — I5032 Chronic diastolic (congestive) heart failure: Secondary | ICD-10-CM | POA: Diagnosis not present

## 2021-10-25 DIAGNOSIS — I130 Hypertensive heart and chronic kidney disease with heart failure and stage 1 through stage 4 chronic kidney disease, or unspecified chronic kidney disease: Secondary | ICD-10-CM | POA: Diagnosis not present

## 2021-10-25 DIAGNOSIS — N1832 Chronic kidney disease, stage 3b: Secondary | ICD-10-CM | POA: Diagnosis not present

## 2021-10-26 ENCOUNTER — Other Ambulatory Visit: Payer: Self-pay

## 2021-10-26 ENCOUNTER — Ambulatory Visit
Admission: EM | Admit: 2021-10-26 | Discharge: 2021-10-26 | Disposition: A | Discharge status: Home / Self Care | Payer: Medicare Other | Attending: Family Medicine | Admitting: Family Medicine

## 2021-10-26 ENCOUNTER — Encounter: Payer: Self-pay | Admitting: *Deleted

## 2021-10-26 DIAGNOSIS — T83511A Infection and inflammatory reaction due to indwelling urethral catheter, initial encounter: Secondary | ICD-10-CM | POA: Insufficient documentation

## 2021-10-26 DIAGNOSIS — N390 Urinary tract infection, site not specified: Secondary | ICD-10-CM | POA: Insufficient documentation

## 2021-10-26 DIAGNOSIS — R509 Fever, unspecified: Secondary | ICD-10-CM | POA: Diagnosis not present

## 2021-10-26 LAB — POCT URINALYSIS DIP (MANUAL ENTRY)
Glucose, UA: NEGATIVE mg/dL
Ketones, POC UA: NEGATIVE mg/dL
Nitrite, UA: NEGATIVE
Protein Ur, POC: 100 mg/dL — AB
Spec Grav, UA: 1.01 (ref 1.010–1.025)
Urobilinogen, UA: 0.2 E.U./dL
pH, UA: 7 (ref 5.0–8.0)

## 2021-10-26 MED ORDER — CEPHALEXIN 500 MG PO CAPS: 500.0000 mg | ORAL_CAPSULE | Freq: Two times a day (BID) | ORAL | 0 refills | Status: DC

## 2021-10-26 NOTE — ED Notes (Signed)
Pt's current leg bag disconnected; urine sample obtained from catheter, then new urine leg bag placed to catheter.

## 2021-10-26 NOTE — ED Triage Notes (Addendum)
Per pt and wife, pt had suprapubic catheter changed 4 days ago; started with fever 2 days ago up to 102. Wife states pt often gets fevers after placement of suprapubic caths in past. Denies any pain. Denies any cough or cold sxs.

## 2021-10-27 LAB — URINE CULTURE

## 2021-10-28 NOTE — Progress Notes (Signed)
History of Present Illness:   He has a neurogenic bladder.  He has had a longstanding suprapubic tube.  Currently, output is not great as he has an atrophic kidney and a left percutaneous nephrostomy.  He still has bladder spasms and is on Myrbetriq 25 mg a day.  He has recurrent urolithiasis, left renal unit.  Because of multiple ureteral stones and longstanding hydronephrosis, he had a left-sided nephrostomy tube placed in May, 2022.  He states that it hurts when they change it.  Most recent change was 12.12.2022.  CT abdomen and pelvis for follow-up of urolithiasis was performed the day before revealing hydronephrosis and misplaced percutaneous tube.  He did have a 74mm left renal pelvic stone at that time.  1.31.2023: Here because of recent fever/probable UTI.  Had a suprapubic tube changed on the 27th.  Presented to the urgent care center on Saturday the 28th with fever 102.  Culture sent returned multiple organisms.  He was put on Keflex 500 mg twice daily for 7 days.  Had a temperature of 100 last night but is overall improving.  He states he is weak, but he has had MS for years and has limited mobility.  Nephrostomy tube is due to be changed within the next few days in [place]. Past Medical History:  Diagnosis Date   Anemia    Arthritis    Back pain, chronic    Bilateral carotid bruits    C. difficile colitis 09/2011   CAD (coronary artery disease)    Carotid artery stenosis    Cerebrovascular disease    Cerebrovascular disease 08/17/2018   Colon polyps    Complication of cystostomy catheter, initial encounter ([place]) 04/20/2020   Dyslipidemia    Dysphagia 10/07/2011   Encephalopathy    Gait disorder    HA (headache)    High grade dysplasia in colonic adenoma 09/2005   History of kidney stones    HTN (hypertension), malignant 10/06/2011   Hypernatremia    Hypokalemia    Hypothyroidism 10/08/2011   Insomnia    Junctional rhythm    Kidney stones    MS (multiple sclerosis) (HCC)     Neuromuscular disorder (HCC)    MS   OSA (obstructive sleep apnea)    Paroxysmal atrial tachycardia ([place])    Peripheral vascular disease ([place])    Pneumonia 4 yrs ago   Pulmonary fibrosis ([place]) 10/06/2011   Pulmonary nodule 10/08/2011   PVD (peripheral vascular disease) ([place])    Sacral ulcer (HCC)    Sleep apnea    cannot tolerate   Stroke ([place])    left sided weakness   Suprapubic catheter ([place])    TIA (transient ischemic attack)    Tremors of nervous system 10/08/2011   Urinary tract infection    Ventral hernia without obstruction or gangrene     Large 8X9cm ventral hernia with loss of domain. CT reads report as diastasis recti with herniation or diastasis recti.  Dr. Constance Haw, Surgery, reviewed CT with radiology and there is herniation with only hernia sac or peritoneum over the bowel and large separation of the rectus muslce (i.e. diastasis recti aka loss of domain).  No surgical intervention recommended given size, age, and health.     Past Surgical History:  Procedure Laterality Date   APPENDECTOMY  09/2005   at time of left hemicolectomy   BACK SURGERY  1976/1979   lower   BILIARY DILATION N/A 03/03/2020   Procedure: BILIARY DILATION;  Surgeon: Rogene Houston, MD;  [facility];  Service: Endoscopy;  Laterality: N/A;   CATARACT EXTRACTION W/PHACO Right 03/08/2018   Procedure: CATARACT EXTRACTION PHACO AND INTRAOCULAR LENS PLACEMENT RIGHT EYE;  Surgeon: Tonny Branch, MD;  Location: [facility];  Service: Ophthalmology;  Laterality: Right;  CDE: 8.86   CATARACT EXTRACTION W/PHACO Left 04/05/2018   Procedure: CATARACT EXTRACTION PHACO AND INTRAOCULAR LENS PLACEMENT (IOC);  Surgeon: Tonny Branch, MD;  Location: [facility];  Service: Ophthalmology;  Laterality: Left;  CDE: 7.36   CENTRAL LINE INSERTION-TUNNELED Right 09/11/2020   Procedure: PLACEMENT OF TUNNELED CENTRAL LINE INTO JUGULAR VEIN;  Surgeon: Virl Cagey, MD;  Location: [facility];  Service: General;  Laterality: Right;    CHOLECYSTECTOMY     Dr. Tamala Julian   COLON SURGERY  09/2005   [place]man: four tubular adenomas, large adenomatous polyp with HIGH GRADE dysplasia   COLONOSCOPY  11/2004   Dr. Sharol Roussel sessile polyp splenic flexure, 69mm sessile polyp desc colon, tubulovillous adenoma (bx not removed)   COLONOSCOPY  01/2005   poor prep, polyp could not be found   COLONOSCOPY  05/2005   with EMR, polypectomy Dr. Olegario Messier, bx showed high grade dysplasia, partially resected   COLONOSCOPY  09/2005   Dr. Arsenio Loader, [place] ink tattooing, four villous colon polyp (3 had been missed on previous colonoscopies due to limitations of procedures   COLONOSCOPY  09/2006   normal TI, no polyps   COLONOSCOPY  10/2007   Dr. Imogene Burn distal mammillations, benign bx, normal TI, random bx neg for microscopic colitis   CYSTOSCOPY W/ URETERAL STENT PLACEMENT Left 09/05/2020   Procedure: CYSTOSCOPY WITH RETROGRADE PYELOGRAM/URETERAL STENT PLACEMENT;  Surgeon: Ardis Hughs, MD;  Location: [facility];  Service: Urology;  Laterality: Left;   CYSTOSCOPY WITH LITHOLAPAXY N/A 07/27/2018   Procedure: CYSTOSCOPY WITH LITHOLAPAXY VIA  SUPRAPUBIC TUBE;  Surgeon: Franchot Gallo, MD;  Location: [facility];  Service: Urology;  Laterality: N/A;   CYSTOSCOPY WITH RETROGRADE PYELOGRAM, URETEROSCOPY AND STENT PLACEMENT Left 06/09/2017   Procedure: CYSTOSCOPY WITH LEFT RETROGRADE PYELOGRAM, LEFT URETEROSCOPY, LEFT URETEROSCOPIC STONE EXTRACTION, LEFT URETERAL STENT PLACEMENT;  Surgeon: Franchot Gallo, MD;  Location: [facility];  Service: Urology;  Laterality: Left;   CYSTOSCOPY/URETEROSCOPY/HOLMIUM LASER/STENT PLACEMENT Left 05/22/2020   Procedure: CYSTOSCOPY/URETEROSCOPY/STENT PLACEMENT;  Surgeon: Franchot Gallo, MD;  Location: [facility];  Service: Urology;  Laterality: Left;   ERCP N/A 03/03/2020   Procedure: ENDOSCOPIC RETROGRADE CHOLANGIOPANCREATOGRAPHY (ERCP);  Surgeon: Rogene Houston, MD;  [facility];  Service: Endoscopy;   Laterality: N/A;   ERCP N/A 04/20/2020   Procedure: ENDOSCOPIC RETROGRADE CHOLANGIOPANCREATOGRAPHY (ERCP);  Surgeon: Rogene Houston, MD;  [facility];  Service: Endoscopy;  Laterality: N/A;  to be done at 7:30am in [place] N/A 04/20/2020   Procedure: STENT REMOVAL;  Surgeon: Rogene Houston, MD;  [facility];  Service: Endoscopy;  Laterality: N/A;   INGUINAL HERNIA REPAIR  1971   bilateral   INSERTION OF SUPRAPUBIC CATHETER  06/09/2017   Procedure: EXCHANGE OF SUPRAPUBIC CATHETER;  Surgeon: Franchot Gallo, MD;  Location: [facility];  Service: Urology;;   INSERTION OF SUPRAPUBIC CATHETER  05/22/2020   Procedure: SUPRAPUBIC CATHETER EXCHANGE;  Surgeon: Franchot Gallo, MD;  Location: [facility];  Service: Urology;;   IR NEPHROSTOMY EXCHANGE LEFT  03/25/2021   IR NEPHROSTOMY EXCHANGE LEFT  05/20/2021   IR NEPHROSTOMY EXCHANGE LEFT  07/13/2021   IR NEPHROSTOMY EXCHANGE LEFT  09/09/2021   IR NEPHROSTOMY PLACEMENT LEFT  05/26/2017  IR NEPHROSTOMY PLACEMENT LEFT  05/02/2020   IR NEPHROSTOMY PLACEMENT LEFT  02/15/2021   IR URETERAL STENT PERC REMOVAL MOD SED  03/25/2021   KIDNEY STONE SURGERY  09/13/2015   LITHOTRIPSY N/A 03/03/2020   Procedure: MECHANICAL LITHOTRIPSY WITH REMOVAL OF MULTIPLE STONE FRAGMENTS;  Surgeon: Rogene Houston, MD;  [facility];  Service: Endoscopy;  Laterality: N/A;   NEPHROLITHOTOMY Left 09/13/2015   Procedure: LEFT PERCUTANEOUS NEPHROLITHOTOMY ;  Surgeon: Franchot Gallo, MD;  Location: [facility];  Service: Urology;  Laterality: Left;   NEPHROSTOMY TUBE REMOVAL  05/22/2020   Procedure: NEPHROSTOMY TUBE REMOVAL;  Surgeon: Franchot Gallo, MD;  Location: [facility];  Service: Urology;;   [facility] REMOVAL Right 09/20/2020   Procedure: MINOR REMOVAL CENTRAL LINE;  Surgeon: Virl Cagey, MD;  Location: [facility];  Service: General;  Laterality: Right;  Pt to arrive at 7:30am for procedure   REMOVAL OF STONES N/A  04/20/2020   Procedure: REMOVAL OF STONES;  Surgeon: Rogene Houston, MD;  [facility];  Service: Endoscopy;  Laterality: N/A;   SPHINCTEROTOMY  03/03/2020   Procedure: BILLARY SPHINCTEROTOMY;  Surgeon: Rogene Houston, MD;  [facility];  Service: Endoscopy;;   SUPRAPUBIC CATHETER INSERTION      Home Medications:  Allergies as of 10/29/2021       Reactions   Tetracyclines & Related Anaphylaxis, Rash   Ciprofloxacin    Trouble swallowing unknown reaction according to wife         Medication List        Accurate as of October 28, 2021  7:34 PM. If you have any questions, ask your nurse or doctor.          acetaminophen 325 MG tablet Commonly known as: TYLENOL Take 650 mg by mouth every 6 (six) hours as needed.   amLODipine 10 MG tablet Commonly known as: NORVASC Take 10 mg by mouth daily.   cephALEXin 500 MG capsule Commonly known as: KEFLEX Take 1 capsule (500 mg total) by mouth 2 (two) times daily.   diazepam 5 MG tablet Commonly known as: VALIUM TAKE 1 TABLET EVERY 12 HOURS AS NEEDED FOR MUSCLE SPASMS   GABAPENTIN PO Take by mouth.   HYDROcodone-acetaminophen 7.5-325 MG tablet Commonly known as: NORCO Take 1 tablet by mouth 2 (two) times daily. Max APAP 3 GM IN 24 HOURS FROM ALL SOURCES What changed:  when to take this additional instructions   levothyroxine 75 MCG tablet Commonly known as: SYNTHROID Take 75 mcg by mouth daily.   OXYGEN Inhale 1 L into the lungs every evening.   potassium chloride SA 20 MEQ tablet Commonly known as: KLOR-CON M Take 20 mEq by mouth daily.        Allergies:  Allergies  Allergen Reactions   Tetracyclines & Related Anaphylaxis and Rash   Ciprofloxacin     Trouble swallowing unknown reaction according to wife     Family History  Problem Relation Age of Onset   Cirrhosis Brother        etoh   Stroke Mother 20   Coronary artery disease Father 57   Heart attack Brother    Cancer Sister     Multiple sclerosis Other    Colon cancer Neg Hx     Social History:  reports that he quit smoking about 33 years ago. His smoking use included cigarettes. He has a 25.00 pack-year smoking history. He has never used smokeless tobacco. He reports that he  does not drink alcohol and does not use drugs.  ROS: A complete review of systems was performed.  All systems are negative except for pertinent findings as noted.  Physical Exam:  Vital signs in last 24 hours: There were no vitals taken for this visit. Constitutional:  Alert and oriented, No acute distress Cardiovascular: Regular rate  Respiratory: Normal respiratory effort Neurologic: Grossly intact, no focal deficits Psychiatric: Normal mood and affect  I have reviewed prior pt notes  I have reviewed notes from referring/previous physicians-ER notes reviewed  I have reviewed urinalysis results  I have independently reviewed prior imaging-I reviewed most recent CT images with the patient and his wife  I have reviewed prior urine culture   Impression/Assessment:  1.  Right renal atrophy, most likely nonfunctioning kidney  2.  Neurogenic bladder, suprapubic tube in place  3.  Left hydronephrosis with solitary 15 mm stone in renal pelvis, he has an indwelling nephrostomy tube  4.  Recent urinary tract infection, on antibiotics  Plan:  1.  Complete full course of Keflex  2.  We will continue conservative management of his asymptomatic, stable in size left renal pelvic stone  3.  Continue same follow-up with [facility] in approximately 3 months

## 2021-10-28 NOTE — ED Provider Notes (Signed)
[place]    ASSESSMENT & PLAN:  1. Fever, unspecified fever cause   2. Urinary tract infection associated with catheterization of urinary tract, unspecified indwelling urinary catheter type, initial encounter ([place])    Benign abdomen. Begin: Meds ordered this encounter  Medications   cephALEXin (KEFLEX) 500 MG capsule    Sig: Take 1 capsule (500 mg total) by mouth 2 (two) times daily.    Dispense:  14 capsule    Refill:  0   No s/s of sepsis. Urine culture sent. Regardless, would have him complete entire course of Keflex; discussed.  Will follow up with his PCP or here if not showing improvement over the next 48 hours, sooner if needed.  Outlined signs and symptoms indicating need for more acute intervention. Patient verbalized understanding. After Visit Summary given.  SUBJECTIVE:  Jordan Ward is a 81 y.o. male who complains of fever; x 2 days. Noted after changing suprapubic catheter 4 d ago. H/O same after changing catheter with resulting infection. Alert/oriented per wife. Otherwise feeling well.  Without associated flank pain. Gross hematuria: not present. No specific aggravating or alleviating factors reported. No LE edema. Normal PO intake without n/v/d. Without specific abdominal pain. Ambulatory without difficulty.   OBJECTIVE:  Vitals:   10/26/21 1516  BP: (!) 156/74  Pulse: 73  Resp: (!) 22  Temp: 99.3 F (37.4 C)  TempSrc: Oral  SpO2: 93%   Recheck RR: 18  General appearance: alert; no distress HENT: oropharynx: moist Lungs: unlabored respirations Abdomen: soft, non-tender Back: no CVA tenderness Extremities: no edema; symmetrical with no gross deformities Skin: warm and dry Neurologic: normal gait Psychological: alert and cooperative; normal mood and affect  Labs Reviewed  URINE CULTURE   POCT URINALYSIS DIP (MANUAL ENTRY) - Abnormal; Notable for the following components:   Bilirubin, UA small (*)    Blood, UA large (*)     Protein Ur, POC =100 (*)    Leukocytes, UA Large (3+) (*)    All other components within normal limits    Allergies  Allergen Reactions   Tetracyclines & Related Anaphylaxis and Rash   Ciprofloxacin     Trouble swallowing unknown reaction according to wife     Past Medical History:  Diagnosis Date   Anemia    Arthritis    Back pain, chronic    Bilateral carotid bruits    C. difficile colitis 09/2011   CAD (coronary artery disease)    Carotid artery stenosis    Cerebrovascular disease    Cerebrovascular disease 08/17/2018   Colon polyps    Complication of cystostomy catheter, initial encounter ([place]) 04/20/2020   Dyslipidemia    Dysphagia 10/07/2011   Encephalopathy    Gait disorder    HA (headache)    High grade dysplasia in colonic adenoma 09/2005   History of kidney stones    HTN (hypertension), malignant 10/06/2011   Hypernatremia    Hypokalemia    Hypothyroidism 10/08/2011   Insomnia    Junctional rhythm    Kidney stones    MS (multiple sclerosis) (HCC)    Neuromuscular disorder (HCC)    MS   OSA (obstructive sleep apnea)    Paroxysmal atrial tachycardia (HCC)    Peripheral vascular disease (HCC)    Pneumonia 4 yrs ago   Pulmonary fibrosis ([place]) 10/06/2011   Pulmonary nodule 10/08/2011   PVD (peripheral vascular disease) (HCC)    Sacral ulcer (HCC)    Sleep apnea    cannot tolerate  Stroke [facility])    left sided weakness   Suprapubic catheter ([place])    TIA (transient ischemic attack)    Tremors of nervous system 10/08/2011   Urinary tract infection    Ventral hernia without obstruction or gangrene     Large 8X9cm ventral hernia with loss of domain. CT reads report as diastasis recti with herniation or diastasis recti.  Dr. Constance Haw, Surgery, reviewed CT with radiology and there is herniation with only hernia sac or peritoneum over the bowel and large separation of the rectus muslce (i.e. diastasis recti aka loss of domain).  No surgical intervention recommended given size,  age, and health.    Social History   Socioeconomic History   Marital status: Married    Spouse name: Pricilla Holm   Number of children: 2   Years of education: 10   Highest education level: Not on file  Occupational History   Occupation: disability    Comment: [employer]    Employer: RETIRED  Tobacco Use   Smoking status: Former    Packs/day: 1.00    Years: 25.00    Pack years: 25.00    Types: Cigarettes    Quit date: 03/03/1988    Years since quitting: 33.6   Smokeless tobacco: Never  Vaping Use   Vaping Use: Never used  Substance and Sexual Activity   Alcohol use: No   Drug use: No   Sexual activity: Not on file  Other Topics Concern   Not on file  Social History Narrative   Patient is married Pricilla Holm) and  Lives w/ wife and daughter Ivin Booty   Drinks 1-2 cups caffeine daily    Patient is left handed.    Social Determinants of Health   Financial Resource Strain: Not on file  Food Insecurity: Not on file  Transportation Needs: Not on file  Physical Activity: Not on file  Stress: Not on file  Social Connections: Not on file  Intimate Partner Violence: Not on file   Family History  Problem Relation Age of Onset   Cirrhosis Brother        etoh   Stroke Mother 90   Coronary artery disease Father 78   Heart attack Brother    Cancer Sister    Multiple sclerosis Other    Colon cancer Neg Hx         Vanessa Kick, MD 10/28/21 1124

## 2021-10-29 ENCOUNTER — Encounter: Payer: Self-pay | Admitting: Urology

## 2021-10-29 ENCOUNTER — Ambulatory Visit (INDEPENDENT_AMBULATORY_CARE_PROVIDER_SITE_OTHER): Payer: Medicare Other | Admitting: Urology

## 2021-10-29 ENCOUNTER — Other Ambulatory Visit: Payer: Self-pay

## 2021-10-29 VITALS — BP 105/69 | HR 92 | Temp 98.7°F | Wt 113.0 lb

## 2021-10-29 DIAGNOSIS — N200 Calculus of kidney: Secondary | ICD-10-CM | POA: Diagnosis not present

## 2021-10-29 DIAGNOSIS — N390 Urinary tract infection, site not specified: Secondary | ICD-10-CM

## 2021-10-29 DIAGNOSIS — E782 Mixed hyperlipidemia: Secondary | ICD-10-CM | POA: Diagnosis not present

## 2021-10-29 DIAGNOSIS — N1330 Unspecified hydronephrosis: Secondary | ICD-10-CM | POA: Diagnosis not present

## 2021-10-29 DIAGNOSIS — N312 Flaccid neuropathic bladder, not elsewhere classified: Secondary | ICD-10-CM | POA: Diagnosis not present

## 2021-10-29 DIAGNOSIS — R339 Retention of urine, unspecified: Secondary | ICD-10-CM

## 2021-10-29 DIAGNOSIS — N261 Atrophy of kidney (terminal): Secondary | ICD-10-CM

## 2021-10-29 DIAGNOSIS — I10 Essential (primary) hypertension: Secondary | ICD-10-CM | POA: Diagnosis not present

## 2021-10-29 MED ORDER — LEVOFLOXACIN 500 MG PO TABS: ORAL_TABLET | ORAL | 1 refills | Status: DC

## 2021-10-29 NOTE — Progress Notes (Signed)
Urological Symptom Review  Patient is experiencing the following symptoms: none   Review of Systems  Gastrointestinal (upper)  : Negative for upper GI symptoms  Gastrointestinal (lower) : Negative for lower GI symptoms  Constitutional : Fatigue  Skin: Negative for skin symptoms  Eyes: Negative for eye symptoms  Ear/Nose/Throat : Negative for Ear/Nose/Throat symptoms  Hematologic/Lymphatic: Negative for Hematologic/Lymphatic symptoms  Cardiovascular : Leg swelling  Respiratory : Negative for respiratory symptoms  Endocrine: Negative for endocrine symptoms  Musculoskeletal: Negative for musculoskeletal symptoms  Neurological: Negative for neurological symptoms  Psychologic: Negative for psychiatric symptoms

## 2021-11-04 ENCOUNTER — Ambulatory Visit (HOSPITAL_COMMUNITY)
Admission: RE | Admit: 2021-11-04 | Discharge: 2021-11-04 | Disposition: A | Discharge status: Home / Self Care | Payer: Medicare Other | Source: Ambulatory Visit | Attending: Diagnostic Radiology | Admitting: Diagnostic Radiology

## 2021-11-04 ENCOUNTER — Other Ambulatory Visit: Payer: Self-pay

## 2021-11-04 DIAGNOSIS — N319 Neuromuscular dysfunction of bladder, unspecified: Secondary | ICD-10-CM | POA: Diagnosis not present

## 2021-11-04 DIAGNOSIS — Z436 Encounter for attention to other artificial openings of urinary tract: Secondary | ICD-10-CM | POA: Diagnosis not present

## 2021-11-04 DIAGNOSIS — Z466 Encounter for fitting and adjustment of urinary device: Secondary | ICD-10-CM | POA: Diagnosis not present

## 2021-11-04 HISTORY — PX: IR NEPHROSTOMY EXCHANGE LEFT: IMG6069

## 2021-11-04 MED ORDER — IOHEXOL 300 MG/ML  SOLN
50.0000 mL | Freq: Once | INTRAMUSCULAR | Status: AC | PRN
  Administered 2021-11-04: 8 mL

## 2021-11-04 MED ORDER — LIDOCAINE HCL 1 % IJ SOLN
INTRAMUSCULAR | Status: AC
  Filled 2021-11-04: qty 20

## 2021-11-04 NOTE — Procedures (Signed)
Interventional Radiology Procedure Note  Procedure: left pcn exchg    Complications: None  Estimated Blood Loss:  0  Findings: 12 fr    M. Daryll Brod, MD

## 2021-11-05 ENCOUNTER — Other Ambulatory Visit (HOSPITAL_COMMUNITY): Payer: Self-pay | Admitting: Interventional Radiology

## 2021-11-05 DIAGNOSIS — N319 Neuromuscular dysfunction of bladder, unspecified: Secondary | ICD-10-CM

## 2021-11-11 DIAGNOSIS — Z23 Encounter for immunization: Secondary | ICD-10-CM | POA: Diagnosis not present

## 2021-11-12 DIAGNOSIS — J449 Chronic obstructive pulmonary disease, unspecified: Secondary | ICD-10-CM | POA: Diagnosis not present

## 2021-11-12 DIAGNOSIS — D631 Anemia in chronic kidney disease: Secondary | ICD-10-CM | POA: Diagnosis not present

## 2021-11-12 DIAGNOSIS — I420 Dilated cardiomyopathy: Secondary | ICD-10-CM | POA: Diagnosis not present

## 2021-11-12 DIAGNOSIS — G35 Multiple sclerosis: Secondary | ICD-10-CM | POA: Diagnosis not present

## 2021-11-12 DIAGNOSIS — N1832 Chronic kidney disease, stage 3b: Secondary | ICD-10-CM | POA: Diagnosis not present

## 2021-11-12 DIAGNOSIS — I130 Hypertensive heart and chronic kidney disease with heart failure and stage 1 through stage 4 chronic kidney disease, or unspecified chronic kidney disease: Secondary | ICD-10-CM | POA: Diagnosis not present

## 2021-11-12 DIAGNOSIS — I5032 Chronic diastolic (congestive) heart failure: Secondary | ICD-10-CM | POA: Diagnosis not present

## 2021-11-12 DIAGNOSIS — J9611 Chronic respiratory failure with hypoxia: Secondary | ICD-10-CM | POA: Diagnosis not present

## 2021-11-12 DIAGNOSIS — J8410 Pulmonary fibrosis, unspecified: Secondary | ICD-10-CM | POA: Diagnosis not present

## 2021-11-12 DIAGNOSIS — N1330 Unspecified hydronephrosis: Secondary | ICD-10-CM | POA: Diagnosis not present

## 2021-11-19 NOTE — Progress Notes (Addendum)
PATIENT: Jordan Ward DOB: [birth year]  REASON FOR VISIT: Follow up for MS HISTORY FROM: Patient and his wife  PRIMARY NEUROLOGIST: [name]   HISTORY OF PRESENT ILLNESS: Today 11/20/21 [name] here today with follow-up for history of MS, not on any disease modifying agents.  History of tremor at the time of illness on 2 separate hospital admissions.  Felt to be rigors related to the infection. Has nephrostomy tube now. Has poor energy, he chooses not to get around per his wife, mostly sits on his phone. Uses walker, 2 near falls, his wife has caught him twice. Uses oxygen at night. Has OSA, stopped using CPAP. Takes Valium 5 mg twice daily for muscle spasms in legs. His wife thinks he is doing fairly well. On hydrocodone from PCP for chronic pain. Reviewed drug registry last filled Valium 11/07/21. They have to take a Lucianne Lei to [place], his wife doesn't drive here.   HISTORY  03/07/2021 Dr. Jannifer Ward: [name] is a 81 year old right-handed white male with a history of multiple sclerosis.  He currently is not on any disease modifying agents.  The patient has a neurogenic bladder, he has a suprapubic catheter in place, he has had problems with hydronephrosis and has had stent placements previously.  He comes in for an evaluation of tremor.  He has had episodes of severe tremor that have occurred on 2 occasions.  The patient was admitted to the hospital on 05 September 2020 with a fever and left flank pain and tachycardia.  He had a white count of 18.4..  He had bacterial sepsis with Enterococcus and Pseudomonas.  He had at least a 2-day event of severe shaking chills during this event.  He was admitted to the hospital again on 13 Feb 2021 with a similar issue.  He was running a low-grade temperature, but the blood cultures were negative at that time.  The patient was treated with antibiotics.  He had 3 days of shaking chills once again.  The patient had difficulty with speaking and walking during these  hard shaking chills.  After the infection was treated on both occasions, the tremors disappeared.  The patient has had ongoing left flank pain.  He is sent to this office for further evaluation.  He had COVID and October 2021, he has had significant fatigue issues and decline in his ability to ambulate since that time.  He has been on oxygen continuously until just recently when he was converted to oxygen just in the evening.  He is now walking with a walker since the COVID infection.  He never got vaccinated for COVID.  He comes here for further evaluation.  He denies any new numbness or weakness of the face, arms, legs.  He denies any vision changes or difficulty with swallowing.  He is getting physical therapy in the home environment.  REVIEW OF SYSTEMS: Out of a complete 14 system review of symptoms, the patient complains only of the following symptoms, and all other reviewed systems are negative.  See HPI  ALLERGIES: Allergies  Allergen Reactions   Tetracyclines & Related Anaphylaxis and Rash   Ciprofloxacin     Trouble swallowing unknown reaction according to wife     HOME MEDICATIONS: Outpatient Medications Prior to Visit  Medication Sig Dispense Refill   acetaminophen (TYLENOL) 325 MG tablet Take 650 mg by mouth every 6 (six) hours as needed.     amLODipine (NORVASC) 10 MG tablet Take 10 mg by mouth daily.  diazepam (VALIUM) 5 MG tablet TAKE 1 TABLET EVERY 12 HOURS AS NEEDED FOR MUSCLE SPASMS 60 tablet 3   GABAPENTIN PO Take by mouth.     HYDROcodone-acetaminophen (NORCO) 7.5-325 MG per tablet Take 1 tablet by mouth 2 (two) times daily. Max APAP 3 GM IN 24 HOURS FROM ALL SOURCES (Patient taking differently: Take 1 tablet by mouth in the morning and at bedtime. ALL MEDICATIONS TO BE CRUSHED AND PLACED IN APPLESAUCE) 60 tablet 0   levofloxacin (LEVAQUIN) 500 MG tablet Take 1 as directed 10 tablet 1   levothyroxine (SYNTHROID) 75 MCG tablet Take 75 mcg by mouth daily.     OXYGEN  Inhale 1 L into the lungs every evening.     potassium chloride SA (KLOR-CON) 20 MEQ tablet Take 20 mEq by mouth daily.     cephALEXin (KEFLEX) 500 MG capsule Take 1 capsule (500 mg total) by mouth 2 (two) times daily. 14 capsule 0   No facility-administered medications prior to visit.    PAST MEDICAL HISTORY: Past Medical History:  Diagnosis Date   Anemia    Arthritis    Back pain, chronic    Bilateral carotid bruits    C. difficile colitis 09/2011   CAD (coronary artery disease)    Carotid artery stenosis    Cerebrovascular disease    Cerebrovascular disease 08/17/2018   Colon polyps    Complication of cystostomy catheter, initial encounter ([place]) 04/20/2020   Dyslipidemia    Dysphagia 10/07/2011   Encephalopathy    Gait disorder    HA (headache)    High grade dysplasia in colonic adenoma 09/2005   History of kidney stones    HTN (hypertension), malignant 10/06/2011   Hypernatremia    Hypokalemia    Hypothyroidism 10/08/2011   Insomnia    Junctional rhythm    Kidney stones    MS (multiple sclerosis) (HCC)    Neuromuscular disorder (HCC)    MS   OSA (obstructive sleep apnea)    Paroxysmal atrial tachycardia ([place])    Peripheral vascular disease ([place])    Pneumonia 4 yrs ago   Pulmonary fibrosis ([place] 10/06/2011   Pulmonary nodule 10/08/2011   PVD (peripheral vascular disease) ([place])    Sacral ulcer (HCC)    Sleep apnea    cannot tolerate   Stroke ([place])    left sided weakness   Suprapubic catheter ([place])    TIA (transient ischemic attack)    Tremors of nervous system 10/08/2011   Urinary tract infection    Ventral hernia without obstruction or gangrene     Large 8X9cm ventral hernia with loss of domain. CT reads report as diastasis recti with herniation or diastasis recti.  Dr. Constance Haw, Surgery, reviewed CT with radiology and there is herniation with only hernia sac or peritoneum over the bowel and large separation of the rectus muslce (i.e. diastasis recti aka loss of domain).  No  surgical intervention recommended given size, age, and health.     PAST SURGICAL HISTORY: Past Surgical History:  Procedure Laterality Date   APPENDECTOMY  09/2005   at time of left hemicolectomy   BACK SURGERY  1976/1979   lower   BILIARY DILATION N/A 03/03/2020   Procedure: BILIARY DILATION;  Surgeon: Rogene Houston, MD;  [facility];  Service: Endoscopy;  Laterality: N/A;   CATARACT EXTRACTION W/PHACO Right 03/08/2018   Procedure: CATARACT EXTRACTION PHACO AND INTRAOCULAR LENS PLACEMENT RIGHT EYE;  Surgeon: Tonny Branch, MD;  Location: [facility];  Service:  Ophthalmology;  Laterality: Right;  CDE: 8.86   CATARACT EXTRACTION W/PHACO Left 04/05/2018   Procedure: CATARACT EXTRACTION PHACO AND INTRAOCULAR LENS PLACEMENT (IOC);  Surgeon: Tonny Branch, MD;  Location: [facility];  Service: Ophthalmology;  Laterality: Left;  CDE: 7.36   CENTRAL LINE INSERTION-TUNNELED Right 09/11/2020   Procedure: PLACEMENT OF TUNNELED CENTRAL LINE INTO JUGULAR VEIN;  Surgeon: Virl Cagey, MD;  Location: [facility];  Service: General;  Laterality: Right;   CHOLECYSTECTOMY     Dr. Tamala Julian   COLON SURGERY  09/2005   [place]man: four tubular adenomas, large adenomatous polyp with HIGH GRADE dysplasia   COLONOSCOPY  11/2004   Dr. Sharol Roussel sessile polyp splenic flexure, 47mm sessile polyp desc colon, tubulovillous adenoma (bx not removed)   COLONOSCOPY  01/2005   poor prep, polyp could not be found   COLONOSCOPY  05/2005   with EMR, polypectomy Dr. Olegario Messier, bx showed high grade dysplasia, partially resected   COLONOSCOPY  09/2005   Dr. Arsenio Loader, [place] ink tattooing, four villous colon polyp (3 had been missed on previous colonoscopies due to limitations of procedures   COLONOSCOPY  09/2006   normal TI, no polyps   COLONOSCOPY  10/2007   Dr. Imogene Burn distal mammillations, benign bx, normal TI, random bx neg for microscopic colitis   CYSTOSCOPY W/ URETERAL STENT PLACEMENT Left 09/05/2020   Procedure:  CYSTOSCOPY WITH RETROGRADE PYELOGRAM/URETERAL STENT PLACEMENT;  Surgeon: Ardis Hughs, MD;  [facility];  Service: Urology;  Laterality: Left;   CYSTOSCOPY WITH LITHOLAPAXY N/A 07/27/2018   Procedure: CYSTOSCOPY WITH LITHOLAPAXY VIA  SUPRAPUBIC TUBE;  Surgeon: Franchot Gallo, MD;  Location: [facility];  Service: Urology;  Laterality: N/A;   CYSTOSCOPY WITH RETROGRADE PYELOGRAM, URETEROSCOPY AND STENT PLACEMENT Left 06/09/2017   Procedure: CYSTOSCOPY WITH LEFT RETROGRADE PYELOGRAM, LEFT URETEROSCOPY, LEFT URETEROSCOPIC STONE EXTRACTION, LEFT URETERAL STENT PLACEMENT;  Surgeon: Franchot Gallo, MD;  [facility];  Service: Urology;  Laterality: Left;   CYSTOSCOPY/URETEROSCOPY/HOLMIUM LASER/STENT PLACEMENT Left 05/22/2020   Procedure: CYSTOSCOPY/URETEROSCOPY/STENT PLACEMENT;  Surgeon: Franchot Gallo, MD;  [facility];  Service: Urology;  Laterality: Left;   ERCP N/A 03/03/2020   Procedure: ENDOSCOPIC RETROGRADE CHOLANGIOPANCREATOGRAPHY (ERCP);  Surgeon: Rogene Houston, MD;  [facility];  Service: Endoscopy;  Laterality: N/A;   ERCP N/A 04/20/2020   Procedure: ENDOSCOPIC RETROGRADE CHOLANGIOPANCREATOGRAPHY (ERCP);  Surgeon: Rogene Houston, MD;  [facility];  Service: Endoscopy;  Laterality: N/A;  to be done at 7:30am in [place] N/A 04/20/2020   Procedure: STENT REMOVAL;  Surgeon: Rogene Houston, MD;  [facility];  Service: Endoscopy;  Laterality: N/A;   INGUINAL HERNIA REPAIR  1971   bilateral   INSERTION OF SUPRAPUBIC CATHETER  06/09/2017   Procedure: EXCHANGE OF SUPRAPUBIC CATHETER;  Surgeon: Franchot Gallo, MD;  Location: [facility];  Service: Urology;;   INSERTION OF SUPRAPUBIC CATHETER  05/22/2020   Procedure: SUPRAPUBIC CATHETER EXCHANGE;  Surgeon: Franchot Gallo, MD;  [facility];  Service: Urology;;   IR NEPHROSTOMY EXCHANGE LEFT  03/25/2021   IR NEPHROSTOMY EXCHANGE LEFT  05/20/2021   IR  NEPHROSTOMY EXCHANGE LEFT  07/13/2021   IR NEPHROSTOMY EXCHANGE LEFT  09/09/2021   IR NEPHROSTOMY EXCHANGE LEFT  11/04/2021   IR NEPHROSTOMY PLACEMENT LEFT  05/26/2017   IR NEPHROSTOMY PLACEMENT LEFT  05/02/2020   IR NEPHROSTOMY PLACEMENT LEFT  02/15/2021   IR URETERAL STENT PERC REMOVAL MOD SED  03/25/2021   KIDNEY STONE SURGERY  09/13/2015   LITHOTRIPSY N/A 03/03/2020   Procedure: MECHANICAL LITHOTRIPSY WITH REMOVAL OF MULTIPLE STONE FRAGMENTS;  Surgeon: Rogene Houston, MD;  [facility];  Service: Endoscopy;  Laterality: N/A;   NEPHROLITHOTOMY Left 09/13/2015   Procedure: LEFT PERCUTANEOUS NEPHROLITHOTOMY ;  Surgeon: Franchot Gallo, MD;  [facility];  Service: Urology;  Laterality: Left;   NEPHROSTOMY TUBE REMOVAL  05/22/2020   Procedure: NEPHROSTOMY TUBE REMOVAL;  Surgeon: Franchot Gallo, MD;  Location: [facility];  Service: Urology;;   [facility] REMOVAL Right 09/20/2020   Procedure: MINOR REMOVAL CENTRAL LINE;  Surgeon: Virl Cagey, MD;  Location: [facility];  Service: General;  Laterality: Right;  Pt to arrive at 7:30am for procedure   REMOVAL OF STONES N/A 04/20/2020   Procedure: REMOVAL OF STONES;  Surgeon: Rogene Houston, MD;  [facility];  Service: Endoscopy;  Laterality: N/A;   SPHINCTEROTOMY  03/03/2020   Procedure: BILLARY SPHINCTEROTOMY;  Surgeon: Rogene Houston, MD;  [facility];  Service: Endoscopy;;   SUPRAPUBIC CATHETER INSERTION      FAMILY HISTORY: Family History  Problem Relation Age of Onset   Cirrhosis Brother        etoh   Stroke Mother 74   Coronary artery disease Father 64   Heart attack Brother    Cancer Sister    Multiple sclerosis Other    Colon cancer Neg Hx     SOCIAL HISTORY: Social History   Socioeconomic History   Marital status: Married    Spouse name: Pricilla Holm   Number of children: 2   Years of education: 10   Highest education level: Not on file  Occupational History   Occupation: disability     Comment: [employer]    Employer: RETIRED  Tobacco Use   Smoking status: Former    Packs/day: 1.00    Years: 25.00    Pack years: 25.00    Types: Cigarettes    Quit date: 03/03/1988    Years since quitting: 33.7   Smokeless tobacco: Never  Vaping Use   Vaping Use: Never used  Substance and Sexual Activity   Alcohol use: No   Drug use: No   Sexual activity: Not on file  Other Topics Concern   Not on file  Social History Narrative   Patient is married Pricilla Holm) and  Lives w/ wife and daughter Ivin Booty   Drinks 1-2 cups caffeine daily    Patient is left handed.    Social Determinants of Health   Financial Resource Strain: Not on file  Food Insecurity: Not on file  Transportation Needs: Not on file  Physical Activity: Not on file  Stress: Not on file  Social Connections: Not on file  Intimate Partner Violence: Not on file   PHYSICAL EXAM  Vitals:   11/20/21 1512  BP: 139/66  Pulse: 68  Weight: 112 lb (50.8 kg)  Height: 5\' 5"  (1.651 m)   Body mass index is 18.64 kg/m.  Generalized: Well developed, in no acute distress, elderly male, appears generally deconditioned  Neurological examination  Mentation: Alert oriented to time, place, most history is provided by his wife. Follows all commands, speech is slurred  Cranial nerve II-XII: Pupils were equal round reactive to light. Extraocular movements were full, visual field were full on confrontational test. Facial sensation and strength were normal. Head turning and shoulder shrug were normal and symmetric. Motor: no significant muscle weakness noted Sensory: Sensory testing is intact to soft  touch on all 4 extremities. No evidence of extinction is noted.  Coordination: Cerebellar testing reveals good finger-nose-finger and heel-to-shin bilaterally.  Gait and station: gait is wide based, can walk short distances independently, uses walker in hallway, is fairly steady Reflexes: Deep tendon reflexes are symmetric and normal  bilaterally.   DIAGNOSTIC DATA (LABS, IMAGING, TESTING) - I reviewed patient records, labs, notes, testing and imaging myself where available.  Lab Results  Component Value Date   WBC 10.0 07/14/2021   HGB 11.2 (L) 07/14/2021   HCT 35.6 (L) 07/14/2021   MCV 93.9 07/14/2021   PLT 163 07/14/2021      Component Value Date/Time   NA 141 07/14/2021 0252   NA 136 (A) 09/27/2018 0000   K 3.2 (L) 07/14/2021 0252   CL 107 07/14/2021 0252   CO2 26 07/14/2021 0252   GLUCOSE 94 07/14/2021 0252   BUN 25 (H) 07/14/2021 0252   BUN 28 (A) 09/27/2018 0000   CREATININE 1.78 (H) 07/14/2021 0252   CALCIUM 8.4 (L) 07/14/2021 0252   PROT 8.0 07/12/2021 1857   ALBUMIN 4.1 07/12/2021 1857   AST 20 07/12/2021 1857   ALT 12 07/12/2021 1857   ALKPHOS 135 (H) 07/12/2021 1857   BILITOT 0.7 07/12/2021 1857   GFRNONAA 39 (L) 07/14/2021 0252   GFRAA 43 (L) 05/07/2020 0521   Lab Results  Component Value Date   CHOL 133 09/27/2018   HDL 39 09/27/2018   LDLCALC 72 09/27/2018   TRIG 108 07/03/2020   CHOLHDL 4.4 08/05/2015   Lab Results  Component Value Date   HGBA1C 5.9 (H) 02/14/2021   Lab Results  Component Value Date   VITAMINB12 405 09/08/2020   Lab Results  Component Value Date   TSH 1.14 09/27/2018   ASSESSMENT AND PLAN 81 y.o. year old male   1.  Multiple sclerosis vs cerebrovascular disease  2.  Gait disorder 3.  Neurogenic bladder 4.  History of tremors, likely rigors  -not on DMT for MS, he has a chronic gait disorder, his neurological condition has remained relatively stable, but his gait has declined since he got COVID in October 2021 -continue to see urology for neurogenic bladder, has suprapubic catheter and nephrostomy -discuss with pcp about assuming refills on Valium going forward, they already fill his hydrocodone, is on Valium for muscle spasms from [place] , it is difficult for them to get transportation to our office  -return back in 1 year, will be followed by Dr.  Leta Baptist since Dr. Jannifer Ward has retired, consider return back here as needed especially if PCP continues to refill his Valium  Butler Denmark, Cornelius, DNP 11/20/2021, 3:24 PM [facility] [address], [place], [place] [address] [phone]

## 2021-11-20 ENCOUNTER — Encounter: Payer: Self-pay | Admitting: Neurology

## 2021-11-20 ENCOUNTER — Ambulatory Visit (INDEPENDENT_AMBULATORY_CARE_PROVIDER_SITE_OTHER): Payer: Medicare Other | Admitting: Neurology

## 2021-11-20 ENCOUNTER — Other Ambulatory Visit: Payer: Self-pay

## 2021-11-20 ENCOUNTER — Ambulatory Visit (INDEPENDENT_AMBULATORY_CARE_PROVIDER_SITE_OTHER): Payer: Medicare Other

## 2021-11-20 VITALS — BP 139/66 | HR 68 | Ht 65.0 in | Wt 112.0 lb

## 2021-11-20 DIAGNOSIS — R269 Unspecified abnormalities of gait and mobility: Secondary | ICD-10-CM

## 2021-11-20 DIAGNOSIS — I679 Cerebrovascular disease, unspecified: Secondary | ICD-10-CM

## 2021-11-20 DIAGNOSIS — R339 Retention of urine, unspecified: Secondary | ICD-10-CM | POA: Diagnosis not present

## 2021-11-20 DIAGNOSIS — G35D Multiple sclerosis, unspecified: Secondary | ICD-10-CM

## 2021-11-20 DIAGNOSIS — G35 Multiple sclerosis: Secondary | ICD-10-CM

## 2021-11-20 MED ORDER — DIAZEPAM 5 MG PO TABS: ORAL_TABLET | ORAL | 3 refills | Status: DC

## 2021-11-20 NOTE — Progress Notes (Signed)
Suprapubic Cath Change  Patient is present today for a suprapubic catheter change due to urinary retention.  51ml of water was drained from the balloon, a 24FR foley cath was removed from the tract with out difficulty.  Site was cleaned and prepped in a sterile fashion with betadine.  A 24FR foley cath was replaced into the tract no complications were noted. Urine return was noted, 10 ml of sterile water was inflated into the balloon and a leg  bag was attached for drainage.  Patient tolerated well. A night bag was given to patient and proper instruction was given on how to switch bags.    Performed by: Estill Bamberg RN  Follow up: 1 month NV SP tube change

## 2021-11-20 NOTE — Patient Instructions (Signed)
Discuss with PCP about refilling Valium going forward for muscle spasms Return back in 1 year

## 2021-12-09 DIAGNOSIS — G35 Multiple sclerosis: Secondary | ICD-10-CM | POA: Diagnosis not present

## 2021-12-09 DIAGNOSIS — N184 Chronic kidney disease, stage 4 (severe): Secondary | ICD-10-CM | POA: Diagnosis not present

## 2021-12-09 DIAGNOSIS — G459 Transient cerebral ischemic attack, unspecified: Secondary | ICD-10-CM | POA: Diagnosis not present

## 2021-12-09 DIAGNOSIS — N261 Atrophy of kidney (terminal): Secondary | ICD-10-CM | POA: Diagnosis not present

## 2021-12-09 DIAGNOSIS — N139 Obstructive and reflux uropathy, unspecified: Secondary | ICD-10-CM | POA: Diagnosis not present

## 2021-12-09 DIAGNOSIS — R7303 Prediabetes: Secondary | ICD-10-CM | POA: Diagnosis not present

## 2021-12-09 DIAGNOSIS — G894 Chronic pain syndrome: Secondary | ICD-10-CM | POA: Diagnosis not present

## 2021-12-09 DIAGNOSIS — N179 Acute kidney failure, unspecified: Secondary | ICD-10-CM | POA: Diagnosis not present

## 2021-12-09 DIAGNOSIS — I2510 Atherosclerotic heart disease of native coronary artery without angina pectoris: Secondary | ICD-10-CM | POA: Diagnosis not present

## 2021-12-09 DIAGNOSIS — D631 Anemia in chronic kidney disease: Secondary | ICD-10-CM | POA: Diagnosis not present

## 2021-12-09 DIAGNOSIS — I129 Hypertensive chronic kidney disease with stage 1 through stage 4 chronic kidney disease, or unspecified chronic kidney disease: Secondary | ICD-10-CM | POA: Diagnosis not present

## 2021-12-19 ENCOUNTER — Other Ambulatory Visit: Payer: Self-pay

## 2021-12-19 ENCOUNTER — Ambulatory Visit (INDEPENDENT_AMBULATORY_CARE_PROVIDER_SITE_OTHER): Payer: Medicare Other

## 2021-12-19 DIAGNOSIS — R339 Retention of urine, unspecified: Secondary | ICD-10-CM | POA: Diagnosis not present

## 2021-12-19 NOTE — Progress Notes (Signed)
Suprapubic Cath Change ? ?Patient is present today for a suprapubic catheter change due to urinary retention.  42m of water was drained from the balloon, a 24FR foley cath was removed from the tract with out difficulty.  Site was cleaned and prepped in a sterile fashion with betadine.  A 24FR foley cath was replaced into the tract no complications were noted. Urine return was noted, 10 ml of sterile water was inflated into the balloon and a leg bag was attached for drainage.  Patient tolerated well. A night bag was given to patient and proper instruction was given on how to switch bags.   ? ?Performed by: KLevi Aland CMA ? ?Follow up: As scheduled.  ?

## 2021-12-23 ENCOUNTER — Other Ambulatory Visit: Payer: Self-pay

## 2021-12-23 ENCOUNTER — Ambulatory Visit (HOSPITAL_COMMUNITY)
Admission: RE | Admit: 2021-12-23 | Discharge: 2021-12-23 | Disposition: A | Discharge status: Home / Self Care | Payer: Medicare Other | Source: Ambulatory Visit | Attending: Interventional Radiology | Admitting: Interventional Radiology

## 2021-12-23 ENCOUNTER — Other Ambulatory Visit (HOSPITAL_COMMUNITY): Payer: Self-pay | Admitting: Interventional Radiology

## 2021-12-23 DIAGNOSIS — N319 Neuromuscular dysfunction of bladder, unspecified: Secondary | ICD-10-CM

## 2021-12-23 DIAGNOSIS — Z436 Encounter for attention to other artificial openings of urinary tract: Secondary | ICD-10-CM | POA: Insufficient documentation

## 2021-12-23 DIAGNOSIS — Z466 Encounter for fitting and adjustment of urinary device: Secondary | ICD-10-CM | POA: Diagnosis not present

## 2021-12-23 HISTORY — PX: IR NEPHROSTOMY EXCHANGE LEFT: IMG6069

## 2021-12-23 MED ORDER — LIDOCAINE HCL 1 % IJ SOLN
INTRAMUSCULAR | Status: AC
  Filled 2021-12-23: qty 20

## 2021-12-23 MED ORDER — IOHEXOL 300 MG/ML  SOLN
10.0000 mL | Freq: Once | INTRAMUSCULAR | Status: AC | PRN
  Administered 2021-12-23: 10 mL

## 2021-12-23 NOTE — Procedures (Signed)
Interventional Radiology Procedure Note ? ?Procedure: left pcn exchg 12 fr   ? ?Complications: None ? ?Estimated Blood Loss:  0 ? ?Findings: ?Full report in pacs ?   ? ?M. Daryll Brod, MD ? ? ? ?

## 2022-01-06 DIAGNOSIS — N184 Chronic kidney disease, stage 4 (severe): Secondary | ICD-10-CM | POA: Diagnosis not present

## 2022-01-13 DIAGNOSIS — N184 Chronic kidney disease, stage 4 (severe): Secondary | ICD-10-CM | POA: Diagnosis not present

## 2022-01-13 DIAGNOSIS — G35 Multiple sclerosis: Secondary | ICD-10-CM | POA: Diagnosis not present

## 2022-01-13 DIAGNOSIS — I10 Essential (primary) hypertension: Secondary | ICD-10-CM | POA: Diagnosis not present

## 2022-01-13 DIAGNOSIS — N319 Neuromuscular dysfunction of bladder, unspecified: Secondary | ICD-10-CM | POA: Diagnosis not present

## 2022-01-13 NOTE — Progress Notes (Signed)
History of Present Illness:  ? ?He has a neurogenic bladder.  He has had a longstanding suprapubic tube.  Currently, output is not great as he has an atrophic kidney and a left percutaneous nephrostomy.  He still has bladder spasms and is on Myrbetriq 25 mg a day. ?  ?He has recurrent urolithiasis, left renal unit.  Because of multiple ureteral stones and longstanding hydronephrosis, he had a left-sided nephrostomy tube placed in May, 2022.  He states that it hurts when they change it.  Most recent change was 12.12.2022.  CT abdomen and pelvis for follow-up of urolithiasis was performed the day before revealing hydronephrosis and misplaced percutaneous tube.  He did have a 32m left renal pelvic stone at that time. ?  ?1.31.2023: Here because of recent fever/probable UTI.  Had a suprapubic tube changed on the 27th.  Presented to the urgent care center on Saturday the 28th with fever 102.  Culture sent returned multiple organisms.  He was put on Keflex 500 mg twice daily for 7 days.  Had a temperature of 100 last night but is overall improving.  He states he is weak, but he has had MS for years and has limited mobility. ? ?4.18.2023: Here today for routine check.  Has suprapubic tube scheduled to be changed today.  In early 2023 his left renal pelvic stone was 16 mm in size.  He is having no symptoms from that.  The large majority of his urine comes out his left nephrostomy tube.  No recent treatment for urinary tract infections. ? ?Past Medical History:  ?Diagnosis Date  ? Anemia   ? Arthritis   ? Back pain, chronic   ? Bilateral carotid bruits   ? C. difficile colitis 09/2011  ? CAD (coronary artery disease)   ? Carotid artery stenosis   ? Cerebrovascular disease   ? Cerebrovascular disease 08/17/2018  ? Colon polyps   ? Com[place] cyst[place] catheter, [place]  ? Dyslipidemia   ? Dysphagia 10/07/2011  ? Encephalopathy   ? Gait disorder   ? HA (headache)   ? High grade dysplasia in  colonic adenoma 09/2005  ? History of kidney stones   ? HTN (hypertension), malignant 10/06/2011  ? Hypernatremia   ? Hypokalemia   ? Hypothyroidism 10/08/2011  ? Insomnia   ? Junctional rhythm   ? Kidney stones   ? [place]multiple [place] ([place]   ? Neuromuscular disorder ([place]   ? MS  ? OSA (obstructive sleep apnea)   ? Paroxysmal atrial tachycardia ([facility]   ? Peripheral vascular disease ([facility]   ? Pneumonia 4 yrs ago  ? [place] 10/06[place]2011  ? Pulmonary nodule 10/08/2011  ? PVD (peripheral vascular disease) ([facility]   ? Sacral ulcer ([facility]   ? Sleep apnea   ? cannot tolerate  ? [facility]   ? left sided weakness  ? Suprapubic catheter ([facility]   ? TIA (transient ischemic attack)   ? Tremors of nervous system 10/08/2011  ? Urinary tract infection   ? Ventral hernia without obstruction or gangrene   ?  Large 8X9cm ventral hernia with loss of domain. CT reads report as diastasis recti with herniation or diastasis recti.  Dr. BConstance Haw Surgery, reviewed CT with radiology and there is herniation with only hernia sac or peritoneum over the bowel and large separation of the rectus muslce (i.e. diastasis recti aka loss of domain).  No surgical intervention recommended given size, age, and health.   ? ? ?  Past Surgical History:  ?Procedure Laterality Date  ? APPENDECTOMY  09/2005  ? at time of left hemicolectomy  ? BACK SURGERY  1976/1979  ? lower  ? BILIARY DILATION N/A 03/03/2020  ? Procedure: BILIARY DILATION;  Surgeon: Rogene [place], MD;  [facility];  Service: Endoscopy;  Laterality: N/A;  ? CATARACT EXTRACTION W/PHACO Right 03/08/2018  ? Procedure: CATARACT EXTRACTION PHACO AND INTRAOCULAR LENS PLACEMENT RIGHT EYE;  Surgeon: Tonny Branch, MD;  Location: [facility];  Service: Ophthalmology;  Laterality: Right;  CDE: 8.86  ? CATARACT EXTRACTION W/PHACO Left 04/05/2018  ? Procedure: CATARACT EXTRACTION PHACO AND INTRAOCULAR LENS PLACEMENT (IOC);  Surgeon: Tonny Branch, MD;  Location: [facility];  Service: Ophthalmology;   Laterality: Left;  CDE: 7.36  ? CENTRAL LINE INSERTION-TUNNELED Right 09/11/2020  ? Procedure: PLACEMENT OF TUNNELED CENTRAL LINE INTO JUGULAR VEIN;  Surgeon: Virl Cagey, MD;  Location: [facility];  Service: General;  Laterality: Right;  ? CHOLECYSTECTOMY    ? Dr. Tamala Julian  ? COLON SURGERY  09/2005  ? Fleishman: four tubular adenomas, large adenomatous polyp with HIGH GRADE dysplasia  ? COLONOSCOPY  11/2004  ? Dr. Sharol Roussel sessile polyp splenic flexure, 86m sessile polyp desc colon, tubulovillous adenoma (bx not removed)  ? COLONOSCOPY  01/2005  ? poor prep, polyp could not be found  ? COLONOSCOPY  05/2005  ? with EMR, polypectomy Dr. JOlegario Messier bx showed high grade dysplasia, partially resected  ? COLONOSCOPY  09/2005  ? Dr. GArsenio Loader [employer] tattooing, four villous colon polyp (3 had been missed on previous colonoscopies due to limitations of procedures  ? COLONOSCOPY  09/2006  ? normal TI, no polyps  ? COLONOSCOPY  10/2007  ? Dr. RImogene Burndistal mammillations, benign bx, normal TI, random bx neg for microscopic colitis  ? CYSTOSCOPY W/ URETERAL STENT PLACEMENT Left 09/05/2020  ? Procedure: CYSTOSCOPY WITH RETROGRADE PYELOGRAM/URETERAL STENT PLACEMENT;  Surgeon: HArdis Hughs MD;  [facility];  Service: Urology;  Laterality: Left;  ? CYSTOSCOPY WITH LITHOLAPAXY N/A 07/27/2018  ? Procedure: CYSTOSCOPY WITH LITHOLAPAXY VIA  SUPRAPUBIC TUBE;  Surgeon: DFranchot Gallo MD;  Location: [facility];  Service: Urology;  Laterality: N/A;  ? CYSTOSCOPY WITH RETROGRADE PYELOGRAM, URETEROSCOPY AND STENT PLACEMENT Left 06/09/2017  ? Procedure: CYSTOSCOPY WITH LEFT RETROGRADE PYELOGRAM, LEFT URETEROSCOPY, LEFT URETEROSCOPIC STONE EXTRACTION, LEFT URETERAL STENT PLACEMENT;  Surgeon: DFranchot Gallo MD;  [facility]: [facility];  Service: Urology;  Laterality: Left;  ? CYSTOSCOPY/URETEROSCOPY/HOLMIUM LASER/STENT PLACEMENT Left 05/22/2020  ? Procedure: CYSTOSCOPY/URETEROSCOPY/STENT PLACEMENT;  Surgeon: DFranchot Gallo MD;  [facility];  Service: Urology;  Laterality: Left;  ? ERCP N/A 03/03/2020  ? Procedure: ENDOSCOPIC RETROGRADE CHOLANGIOPANCREATOGRAPHY (ERCP);  Surgeon: RRogene [place] MD;  [facility];  Service: Endoscopy;  Laterality: N/A;  ? ERCP N/A 04/20/2020  ? Procedure: ENDOSCOPIC RETROGRADE CHOLANGIOPANCREATOGRAPHY (ERCP);  Surgeon: RRogene [place] MD;  [facility];  Service: Endoscopy;  Laterality: N/A;  to be done at 7:30am in OR  ? GASTROINTESTINAL STENT REMOVAL N/A 04/20/2020  ? Procedure: STENT REMOVAL;  Surgeon: RRogene [place] MD;  [facility];  Service: Endoscopy;  Laterality: N/A;  ? INGUINAL HERNIA REPAIR  1971  ? bilateral  ? INSERTION OF SUPRAPUBIC CATHETER  06/09/2017  ? Procedure: EXCHANGE OF SUPRAPUBIC CATHETER;  Surgeon: DFranchot Gallo MD;  Location: [facility];  Service: Urology;;  ? INSERTION OF SUPRAPUBIC CATHETER  05/22/2020  ? Procedure: SUPRAPUBIC CATHETER EXCHANGE;  Surgeon: DFranchot Gallo MD;  [facility]: [facility];  Service: Urology;;  ? IR NEPHROSTOMY EXCHANGE LEFT  03/25/2021  ? IR NEPHROSTOMY EXCHANGE LEFT  05/20/2021  ? IR NEPHROSTOMY EXCHANGE LEFT  07/13/2021  ? IR NEPHROSTOMY EXCHANGE LEFT  09/09/2021  ? IR NEPHROSTOMY EXCHANGE LEFT  11/04/2021  ? IR NEPHROSTOMY EXCHANGE LEFT  12/23/2021  ? IR NEPHROSTOMY PLACEMENT LEFT  05/26/2017  ? IR NEPHROSTOMY PLACEMENT LEFT  05/02/2020  ? IR NEPHROSTOMY PLACEMENT LEFT  02/15/2021  ? IR URETERAL STENT PERC REMOVAL MOD SED  03/25/2021  ? KIDNEY STONE SURGERY  09/13/2015  ? LITHOTRIPSY N/A 03/03/2020  ? Procedure: MECHANICAL LITHOTRIPSY WITH REMOVAL OF MULTIPLE STONE FRAGMENTS;  Surgeon: Rogene [place], MD;  [facility];  Service: Endoscopy;  Laterality: N/A;  ? NEPHROLITHOTOMY Left 09/13/2015  ? Procedure: LEFT PERCUTANEOUS NEPHROLITHOTOMY ;  Surgeon: Franchot Gallo, MD;  Location: [facility];  Service: Urology;  Laterality: Left;  ? NEPHROSTOMY TUBE REMOVAL  05/22/2020  ? Procedure: NEPHROSTOMY  TUBE REMOVAL;  Surgeon: Franchot Gallo, MD;  Location: [facility];  Service: Urology;;  ? [facility] REMOVAL Right 09/20/2020  ? Procedure: MINOR REMOVAL CENTRAL LINE;  Surgeon: Virl Cagey, MD;  Location: A

## 2022-01-14 ENCOUNTER — Ambulatory Visit (INDEPENDENT_AMBULATORY_CARE_PROVIDER_SITE_OTHER): Payer: Medicare Other | Admitting: Urology

## 2022-01-14 VITALS — BP 135/78 | HR 80

## 2022-01-14 DIAGNOSIS — N390 Urinary tract infection, site not specified: Secondary | ICD-10-CM

## 2022-01-14 DIAGNOSIS — N200 Calculus of kidney: Secondary | ICD-10-CM | POA: Diagnosis not present

## 2022-01-14 DIAGNOSIS — N261 Atrophy of kidney (terminal): Secondary | ICD-10-CM

## 2022-01-14 DIAGNOSIS — N312 Flaccid neuropathic bladder, not elsewhere classified: Secondary | ICD-10-CM

## 2022-01-14 NOTE — Progress Notes (Signed)
Suprapubic Cath Change ? ?Patient is present today for a suprapubic catheter change due to urinary retention.  110m of water was drained from the balloon, a 24FR foley cath was removed from the tract with out difficulty.  Site was cleaned and prepped in a sterile fashion with betadine.  A 24FR foley cath was replaced into the tract no complications were noted. Urine return was noted, 10 ml of sterile water was inflated into the balloon and a leg bag was attached for drainage.  Patient tolerated well. A night bag was given to patient and proper instruction was given on how to switch bags.   ? ?Performed by: Jaiah Weigel LPN ? ?Follow up: 1 month sp cath change ?

## 2022-01-14 NOTE — Patient Instructions (Signed)
Foley Catheter Care and Patient Education  Perform catheter care every day.  You can do this while in the shower, but NOT while taking a tub bath.  You will need the following supplies: -mild soap, such as Dove -water -a clean washcloth (not one already used for bathing) or a 4x4 piece of gauze -1 Cath-Secure -night drainage bag -2 alcohol swabs  Was you hands thoroughly with soap and water Using mild soap and water, clan your genital area Men should retract the foreskin, if needed, and clean the area, including the penis Women should separate the labia, and clean the area from front to back  3. Clean your urinary opening, which is where the catheter enters your body. 4. Clean the catheter from where it enters your body and then down, away from your body.  Hold the catheter at the point it enters your body so that you don't put tension on it. 5. Rinse the area well and dry it gently.  Changing the drainage bag You will change your drainage bag twice a day -in the morning after you shower, change he night bag to the leg bag -at night before you go to bed, change the leg bag to the night bag  Wash your hands thoroughly with soap and water Empty the urine from the drainage bag into the toilet before you change it Pinch off the catheter with your fingers and disconnect the used bag Wipe the end of the catheter using an alcohol pad Wipe the connector on the bag using the second alcohol pad Connect the clean bag to the catheter and release your finger pinch Check all connections.  Straighten any kinks or twits in the tubing  Caring for the Leg bag -always wear the leg bag below your knee.  This will help it drain. -keep the leg bag secure with the velcro straps.  If the straps leave a mark on your leg they are to tight and should be loosened.  Leaving the straps too tight can decrease you circulation and lead to blood clots. -empty the leg bag through the spout at the bottom every  2-4 hours as needed.  Do not let the bag become completely full. -do not lie down for longer than 2 hours while you are wearing the leg bag.  Caring for the Night Bag -always keep the night bag below the level of your bladder . -to hang your night bag while you sleep, place a clean plastic bag inside of a wastebasket.  Hang the night bag on the inside of the wastebasket.  Cleaning the Drainage bag Wash you hands thoroughly with soap and water. Rinse the equipment with cool water.  Do not use hot water it can damage the plastic equipment. Was the equipment with a mild liquid detergent (ivory) and rinse with cool water. To decrease odor, fill the bag halfway with a mixture of 1 part vinegar and 3 parts water. Shake the bag and let it sit for 15 minutes. Rinse the bag with cool water and hang it up to dry.  Preventing infection -keep the drainage bag below the level of your bladder and off the floor at all times. -keep the catheter secured to your thigh to prevent it from moving. -do not lie on or block the flow of urine in the tubing. -shower daily to keep the catheter clean. -clean your hands before and after touching the catheter or bag. -the spout of the drainage bag should never touch the side of   the toilet or any emptying container.  Special Points -You may see some blood or urine around where the catheter enters your body, especially when walking or having a bowel movement.  This is normal, as long as there is urine draining into the drainage bag.  If you experience significant leakage around  catheter tube where it enters your urethra possibly associated with lower abdominal cramping you could be having a bladder spasm.  Please notify your doctor and we can prescribe you a medication to improve your symptoms. -drink 1-2 glasses of liquids every 2 hours while you're awake.  Call your doctor immediately if -your catheter comes out, do not try to replace it yourself -you have  temperature of 101F (38.8C) or higher -you have bright red blood or large blood clots in your urine -you have abdominal pain and no urine in your catheter bag   

## 2022-02-07 DIAGNOSIS — R0781 Pleurodynia: Secondary | ICD-10-CM | POA: Diagnosis not present

## 2022-02-11 ENCOUNTER — Ambulatory Visit: Payer: Medicare Other | Admitting: Physician Assistant

## 2022-02-12 DIAGNOSIS — R1310 Dysphagia, unspecified: Secondary | ICD-10-CM | POA: Diagnosis not present

## 2022-02-12 DIAGNOSIS — L84 Corns and callosities: Secondary | ICD-10-CM | POA: Diagnosis not present

## 2022-02-12 DIAGNOSIS — R0989 Other specified symptoms and signs involving the circulatory and respiratory systems: Secondary | ICD-10-CM | POA: Diagnosis not present

## 2022-02-13 ENCOUNTER — Ambulatory Visit (INDEPENDENT_AMBULATORY_CARE_PROVIDER_SITE_OTHER): Payer: Medicare Other | Admitting: Physician Assistant

## 2022-02-13 VITALS — BP 148/84 | HR 76 | Ht 65.0 in | Wt 112.0 lb

## 2022-02-13 DIAGNOSIS — Z436 Encounter for attention to other artificial openings of urinary tract: Secondary | ICD-10-CM

## 2022-02-13 DIAGNOSIS — R0781 Pleurodynia: Secondary | ICD-10-CM

## 2022-02-13 DIAGNOSIS — N139 Obstructive and reflux uropathy, unspecified: Secondary | ICD-10-CM

## 2022-02-13 DIAGNOSIS — Z435 Encounter for attention to cystostomy: Secondary | ICD-10-CM | POA: Diagnosis not present

## 2022-02-13 DIAGNOSIS — R339 Retention of urine, unspecified: Secondary | ICD-10-CM

## 2022-02-13 DIAGNOSIS — W19XXXD Unspecified fall, subsequent encounter: Secondary | ICD-10-CM | POA: Diagnosis not present

## 2022-02-13 NOTE — Progress Notes (Signed)
Assessment: 1. Urinary retention  2. Acute unilateral obstructive uropathy  3. Encounter for care or replacement of suprapubic tube ([place])  4. Attention to nephrostomy ([place])  5. Fall, subsequent encounter  6. Rib pain on left side    Plan: Discussed nephrostomy tube care with the patient and his wife.  She will keep it secured with island dressings in order to prevent dislodgment of the tubing.  SP tube changed today.  Discussed symptomatic care of potential rib fractures and reassured the patient and his wife that his tenderness is isolated to the ribs and there is nothing to indicate that the fall displaced the nephrostomy tube or cause her renal injury.  If he develops worsening pain, hematuria in the nephrostomy drainage, dislodgment of the nephrostomy tube, shortness of breath or chest pain he will go to the emergency department for further evaluation.  Routine follow-up in 1 month for SP tube change.  Keep appointment in 4 days for interventional radiology procedure to change his nephrostomy tube.  Chief Complaint: SP tube change and nephrostomy check  HPI: Jordan Ward is a 81 y.o. male who presents for routine SP tube change and request eval of nephrostomy site due to recent injury.  The patient reports he became off balance while undressing in his bathroom 1 week ago.  He sustained a fall and landed on the posterior lateral aspect of the left side of his rib cage and hit his head.  He has seen his primary care provider for evaluation of the fall and injuries and he and his wife would like to have the nephrostomy tube checked.  It has been draining well without blood.  They report the patient's sutures anchoring nephrostomy tube popped loose a couple of weeks ago, but that relieved some significant discomfort he was having because of the tension on the sutures.  He is due for exchange of the nephrostomy tube in 4 days.  01/14/22 He has a neurogenic bladder.  He has had a  longstanding suprapubic tube.  Currently, output is not great as he has an atrophic kidney and a left percutaneous nephrostomy.  He still has bladder spasms and is on Myrbetriq 25 mg a day.   He has recurrent urolithiasis, left renal unit.  Because of multiple ureteral stones and longstanding hydronephrosis, he had a left-sided nephrostomy tube placed in May, 2022.  He states that it hurts when they change it.  Most recent change was 12.12.2022.  CT abdomen and pelvis for follow-up of urolithiasis was performed the day before revealing hydronephrosis and misplaced percutaneous tube.  He did have a 28m left renal pelvic stone at that time.   1.31.2023: Here because of recent fever/probable UTI.  Had a suprapubic tube changed on the 27th.  Presented to the urgent care center on Saturday the 28th with fever 102.  Culture sent returned multiple organisms.  He was put on Keflex 500 mg twice daily for 7 days.  Had a temperature of 100 last night but is overall improving.  He states he is weak, but he has had MS for years and has limited mobility.   4.18.2023: Here today for routine check.  Has suprapubic tube scheduled to be changed today.  In early 2023 his left renal pelvic stone was 16 mm in size.  He is having no symptoms from that.  The large majority of his urine comes out his left nephrostomy tube.  No recent treatment for urinary tract infections. Portions of the above documentation  were copied from a prior visit for review purposes only.  Allergies: Allergies  Allergen Reactions   Tetracyclines & Related Anaphylaxis and Rash   Ciprofloxacin     Trouble swallowing unknown reaction according to wife     PMH: Past Medical History:  Diagnosis Date   Anemia    Arthritis    Back pain, chronic    Bilateral carotid bruits    C. difficile colitis 09/2011   CAD (coronary artery disease)    Carotid artery stenosis    Cerebrovascular disease    Cerebrovascular disease 08/17/2018   Colon polyps     Complication of cystostomy catheter, initial encounter ([place]) 04/20/2020   Dyslipidemia    Dysphagia 10/07/2011   Encephalopathy    Gait disorder    HA (headache)    High grade dysplasia in colonic adenoma 09/2005   History of kidney stones    HTN (hypertension), malignant 10/06/2011   Hypernatremia    Hypokalemia    Hypothyroidism 10/08/2011   Insomnia    Junctional rhythm    Kidney stones    MS (multiple sclerosis) (HCC)    Neuromuscular disorder (HCC)    MS   OSA (obstructive sleep apnea)    Paroxysmal atrial tachycardia ([place])    Peripheral vascular disease ([place])    Pneumonia 4 yrs ago   Pulmonary fibrosis ([place]) 10/06/2011   Pulmonary nodule 10/08/2011   PVD (peripheral vascular disease) ([place])    Sacral ulcer (HCC)    Sleep apnea    cannot tolerate   Stroke ([place])    left sided weakness   Suprapubic catheter ([place])    TIA (transient ischemic attack)    Tremors of nervous system 10/08/2011   Urinary tract infection    Ventral hernia without obstruction or gangrene     Large 8X9cm ventral hernia with loss of domain. CT reads report as diastasis recti with herniation or diastasis recti.  Dr. Constance Haw, Surgery, reviewed CT with radiology and there is herniation with only hernia sac or peritoneum over the bowel and large separation of the rectus muslce (i.e. diastasis recti aka loss of domain).  No surgical intervention recommended given size, age, and health.     PSH: Past Surgical History:  Procedure Laterality Date   APPENDECTOMY  09/2005   at time of left hemicolectomy   BACK SURGERY  1976/1979   lower   BILIARY DILATION N/A 03/03/2020   Procedure: BILIARY DILATION;  Surgeon: Rogene Houston, MD;  [facility];  Service: Endoscopy;  Laterality: N/A;   CATARACT EXTRACTION W/PHACO Right 03/08/2018   Procedure: CATARACT EXTRACTION PHACO AND INTRAOCULAR LENS PLACEMENT RIGHT EYE;  Surgeon: Tonny Branch, MD;  Location: [facility];  Service: Ophthalmology;  Laterality: Right;  CDE:  8.86   CATARACT EXTRACTION W/PHACO Left 04/05/2018   Procedure: CATARACT EXTRACTION PHACO AND INTRAOCULAR LENS PLACEMENT (IOC);  Surgeon: Tonny Branch, MD;  Location: [facility];  Service: Ophthalmology;  Laterality: Left;  CDE: 7.36   CENTRAL LINE INSERTION-TUNNELED Right 09/11/2020   Procedure: PLACEMENT OF TUNNELED CENTRAL LINE INTO JUGULAR VEIN;  Surgeon: Virl Cagey, MD;  Location: [facility];  Service: General;  Laterality: Right;   CHOLECYSTECTOMY     Dr. Tamala Julian   COLON SURGERY  09/2005   [place]: four tubular adenomas, large adenomatous polyp with HIGH GRADE dysplasia   COLONOSCOPY  11/2004   Dr. Sharol Roussel sessile polyp splenic flexure, 37m sessile polyp desc colon, tubulovillous adenoma (bx not removed)   COLONOSCOPY  01/2005  poor prep, polyp could not be found   COLONOSCOPY  05/2005   with EMR, polypectomy Dr. Olegario Messier, bx showed high grade dysplasia, partially resected   COLONOSCOPY  09/2005   Dr. Arsenio Loader, [place] ink tattooing, four villous colon polyp (3 had been missed on previous colonoscopies due to limitations of procedures   COLONOSCOPY  09/2006   normal TI, no polyps   COLONOSCOPY  10/2007   Dr. Imogene Burn distal mammillations, benign bx, normal TI, random bx neg for microscopic colitis   CYSTOSCOPY W/ URETERAL STENT PLACEMENT Left 09/05/2020   Procedure: CYSTOSCOPY WITH RETROGRADE PYELOGRAM/URETERAL STENT PLACEMENT;  Surgeon: Ardis Hughs, MD;  Location: [facility];  Service: Urology;  Laterality: Left;   CYSTOSCOPY WITH LITHOLAPAXY N/A 07/27/2018   Procedure: CYSTOSCOPY WITH LITHOLAPAXY VIA  SUPRAPUBIC TUBE;  Surgeon: Franchot Gallo, MD;  Location: [facility];  Service: Urology;  Laterality: N/A;   CYSTOSCOPY WITH RETROGRADE PYELOGRAM, URETEROSCOPY AND STENT PLACEMENT Left 06/09/2017   Procedure: CYSTOSCOPY WITH LEFT RETROGRADE PYELOGRAM, LEFT URETEROSCOPY, LEFT URETEROSCOPIC STONE EXTRACTION, LEFT URETERAL STENT PLACEMENT;  Surgeon: Franchot Gallo, MD;   Location: [facility];  Service: Urology;  Laterality: Left;   CYSTOSCOPY/URETEROSCOPY/HOLMIUM LASER/STENT PLACEMENT Left 05/22/2020   Procedure: CYSTOSCOPY/URETEROSCOPY/STENT PLACEMENT;  Surgeon: Franchot Gallo, MD;  Location[facility];  Service: Urology;  Laterality: Left;   ERCP N/A 03/03/2020   Procedure: ENDOSCOPIC RETROGRADE CHOLANGIOPANCREATOGRAPHY (ERCP);  Surgeon: Rogene Houston, MD;  [facility];  Service: Endoscopy;  Laterality: N/A;   ERCP N/A 04/20/2020   Procedure: ENDOSCOPIC RETROGRADE CHOLANGIOPANCREATOGRAPHY (ERCP);  Surgeon: Rogene Houston, MD;  [facility];  Service: Endoscopy;  Laterality: N/A;  to be done at 7:30am in [place] N/A 04/20/2020   Procedure: STENT REMOVAL;  Surgeon: Rogene Houston, MD;  [facility];  Service: Endoscopy;  Laterality: N/A;   INGUINAL HERNIA REPAIR  1971   bilateral   INSERTION OF SUPRAPUBIC CATHETER  06/09/2017   Procedure: EXCHANGE OF SUPRAPUBIC CATHETER;  Surgeon: Franchot Gallo, MD;  Location: [facility];  Service: Urology;;   INSERTION OF SUPRAPUBIC CATHETER  05/22/2020   Procedure: SUPRAPUBIC CATHETER EXCHANGE;  Surgeon: Franchot Gallo, MD;  [facility];  Service: Urology;;   IR NEPHROSTOMY EXCHANGE LEFT  03/25/2021   IR NEPHROSTOMY EXCHANGE LEFT  05/20/2021   IR NEPHROSTOMY EXCHANGE LEFT  07/13/2021   IR NEPHROSTOMY EXCHANGE LEFT  09/09/2021   IR NEPHROSTOMY EXCHANGE LEFT  11/04/2021   IR NEPHROSTOMY EXCHANGE LEFT  12/23/2021   IR NEPHROSTOMY PLACEMENT LEFT  05/26/2017   IR NEPHROSTOMY PLACEMENT LEFT  05/02/2020   IR NEPHROSTOMY PLACEMENT LEFT  02/15/2021   IR URETERAL STENT PERC REMOVAL MOD SED  03/25/2021   KIDNEY STONE SURGERY  09/13/2015   LITHOTRIPSY N/A 03/03/2020   Procedure: MECHANICAL LITHOTRIPSY WITH REMOVAL OF MULTIPLE STONE FRAGMENTS;  Surgeon: Rogene Houston, MD;  [facility];  Service: Endoscopy;  Laterality: N/A;   NEPHROLITHOTOMY Left 09/13/2015    Procedure: LEFT PERCUTANEOUS NEPHROLITHOTOMY ;  Surgeon: Franchot Gallo, MD;  Location: [facility];  Service: Urology;  Laterality: Left;   NEPHROSTOMY TUBE REMOVAL  05/22/2020   Procedure: NEPHROSTOMY TUBE REMOVAL;  Surgeon: Franchot Gallo, MD;  Location: [facility];  Service: Urology;;   [facility] REMOVAL Right 09/20/2020   Procedure: MINOR REMOVAL CENTRAL LINE;  Surgeon: Virl Cagey, MD;  Location: [facility];  Service: General;  Laterality: Right;  Pt to arrive at 7:30am for procedure   REMOVAL  OF STONES N/A 04/20/2020   Procedure: REMOVAL OF STONES;  Surgeon: Rogene Houston, MD;  [facility];  Service: Endoscopy;  Laterality: N/A;   SPHINCTEROTOMY  03/03/2020   Procedure: BILLARY SPHINCTEROTOMY;  Surgeon: Rogene Houston, MD;  [facility];  Service: Endoscopy;;   SUPRAPUBIC CATHETER INSERTION      SH: Social History   Tobacco Use   Smoking status: Former    Packs/day: 1.00    Years: 25.00    Pack years: 25.00    Types: Cigarettes    Quit date: 03/03/1988    Years since quitting: 33.9   Smokeless tobacco: Never  Vaping Use   Vaping Use: Never used  Substance Use Topics   Alcohol use: No   Drug use: No    ROS: See HPI  PE: BP (!) 148/84   Pulse 76   Ht '5\' 5"'$  (1.651 m)   Wt 112 lb (50.8 kg)   BMI 18.64 kg/m  GENERAL APPEARANCE: Chronically ill-appearing, thin, no apparent distress HEENT:  Atraumatic, normocephalic ABDOMEN:  Soft, non-tender, SP site clean EXTREMITIES: No obvious injuries, ecchymosis NEUROLOGIC:  Alert and oriented x 3, ambulates with walker assistance MENTAL STATUS:  appropriate BACK: Significant tenderness posterior lateral aspect of the thoracic cage along ribs 5 and 6.  Nephrostomy tube site is clean and dry.  Tube itself is in correct position with good drainage noted.  Anchor sutures still attached to tubing but have dislodged from the skin.  No tenderness, swelling, ecchymosis around the SP tube site or the remainder of  the patient's back SKIN:  Warm, dry, and intact   Results: Laboratory Data: Lab Results  Component Value Date   WBC 10.0 07/14/2021   HGB 11.2 (L) 07/14/2021   HCT 35.6 (L) 07/14/2021   MCV 93.9 07/14/2021   PLT 163 07/14/2021    Lab Results  Component Value Date   CREATININE 1.78 (H) 07/14/2021     Lab Results  Component Value Date   HGBA1C 5.9 (H) 02/14/2021    Urinalysis    Component Value Date/Time   COLORURINE YELLOW 07/12/2021 2007   APPEARANCEUR CLOUDY (A) 07/12/2021 2007   LABSPEC 1.006 07/12/2021 2007   PHURINE 8.0 07/12/2021 2007   GLUCOSEU NEGATIVE 07/12/2021 2007   HGBUR MODERATE (A) 07/12/2021 2007   BILIRUBINUR small (A) 10/26/2021 1538   KETONESUR negative 10/26/2021 [place] 07/12/2021 2007   PROTEINUR =100 (A) 10/26/2021 1538   PROTEINUR 30 (A) 07/12/2021 2007   UROBILINOGEN 0.2 10/26/2021 1538   UROBILINOGEN 0.2 07/17/2014 1215   NITRITE Negative 10/26/2021 1538   NITRITE POSITIVE (A) 07/12/2021 2007   LEUKOCYTESUR Large (3+) (A) 10/26/2021 1538   LEUKOCYTESUR LARGE (A) 07/12/2021 2007    Lab Results  Component Value Date   BACTERIA MANY (A) 07/12/2021    Pertinent Imaging:  Results for orders placed during the hospital encounter of 04/18/20  DG Abd 1 View - KUB  Narrative CLINICAL DATA:  Follow-up common bile duct stent  EXAM: ABDOMEN - 1 VIEW  COMPARISON:  03/03/2020  FINDINGS: Scattered large and small bowel gas is noted. No obstructive changes are seen. Biliary stent is again seen in the right upper quadrant. Pneumobilia is seen related to the stent. No bony abnormality is noted.  IMPRESSION: Biliary stent in place with associated pneumobilia.   Electronically Signed By: Inez Catalina M.D. On: 04/19/2020 16:46  No results found for this or any previous visit.  No results found  for this or any previous visit.  No results found for this or any previous visit.  Results for orders placed during  the hospital encounter of 10/30/20  US RENAL  Narrative CLINICAL DATA:  Urolithiasis.  EXAM: RENAL / URINARY TRACT ULTRASOUND COMPLETE  COMPARISON:  Noncontrast CT 09/05/2020, renal ultrasound 07/05/2020  FINDINGS: Right Kidney:  Renal measurements: 9.1 x 3.6 x 4.2 cm = volume: 73 mL. Renal parenchymal thinning and increased parenchymal echogenicity. No hydronephrosis. Two small cysts in the medial kidney, 1.5 and 0.9 cm. Echogenic 6 mm structure in the lower kidney likely corresponds to stone is seen on prior CT.  Left Kidney:  Renal measurements: 13.3 x 7.4 x 5.2 cm = volume: 265 mL. Severe hydronephrosis, also seen on prior imaging. There is mild increased renal parenchymal echogenicity. Two cortical cysts, measuring 2.9 cm in the upper pole and 4.1 cm in the upper pole. Intrarenal stone with largest measuring 13 mm in the lower pole.  Bladder:  Catheter decompresses the bladder.  Other:  None.  IMPRESSION: 1. Chronic right renal atrophy with increased echogenicity and renal cysts. Nonobstructing stone in the lower right kidney. 2. Severe left hydronephrosis is similar to prior imaging. 13 mm stone in the left kidney. Left renal cysts.   Electronically Signed By: Keith Rake M.D. On: 10/30/2020 20:27  No results found for this or any previous visit.  No results found for this or any previous visit.  Results for orders placed during the hospital encounter of 02/13/21  CT Renal Stone Study  Narrative CLINICAL DATA:  Left-sided flank pain  EXAM: CT ABDOMEN AND PELVIS WITHOUT CONTRAST  TECHNIQUE: Multidetector CT imaging of the abdomen and pelvis was performed following the standard protocol without IV contrast.  COMPARISON:  CT 11/30/2020, 09/05/2020  FINDINGS: Lower chest: Lung bases demonstrate pulmonary fibrosis. No acute consolidation or pleural effusion.  Hepatobiliary: Status post cholecystectomy. Decreased pneumobilia. Dilated  extrahepatic common bile duct measuring up to 15 mm.  Pancreas: Unremarkable. No pancreatic ductal dilatation or surrounding inflammatory changes.  Spleen: Normal in size without focal abnormality.  Adrenals/Urinary Tract: Adrenal glands are normal. Atrophic right kidney with intrarenal vascular calcifications and nonobstructing stones measuring up to 5 mm. Probable small cysts in the right kidney. Severe left-sided hydronephrosis, worse when compared to most recent prior, now also with increased perinephric fat stranding. Left renal cysts. Ureteral stent with proximal pigtail in the renal pelvis and distal pigtail in the bladder. Marked hydroureter which is also increased. Multiple stone fragments in the urinary bladder. Suprapubic catheter is in place. Stone lower pole left kidney measuring 13 mm.  Stomach/Bowel: The stomach is nonenlarged. There is no dilated small bowel. No acute bowel Schachter thickening. Postsurgical changes at the sigmoid colon.  Vascular/Lymphatic: Advanced aortic atherosclerosis. No aneurysm. Tapered and narrowed appearance of the distal aorta and bifurcation of the iliac vessels. No suspicious nodes.  Reproductive: Prostate is unremarkable.  Other: Negative for free air or free fluid. Anterior abdominal Burkhalter laxity with bulging mesenteric fat and bowel.  Musculoskeletal: No acute or suspicious osseous abnormality.  IMPRESSION: 1. Severe left-sided hydronephrosis and hydroureter, worsened as compared with 11/30/2020, now with moderate left perinephric edema and soft tissue stranding. 2. There are bilateral intrarenal calculi. There are multiple small stones within the urinary bladder. 3. Atrophic right kidney 4. Pulmonary fibrosis at the lung bases.   Electronically Signed By: Donavan Foil M.D. On: 02/13/2021 19:30  No results found for this or any previous visit (from the past  24 hour(s)).

## 2022-02-13 NOTE — Progress Notes (Signed)
Suprapubic Cath Change  Patient is present today for a suprapubic catheter change due to urinary retention.  36m of water was drained from the balloon, a 24FR foley cath was removed from the tract with out difficulty.  Site was cleaned and prepped in a sterile fashion with betadine.  A 24FR foley cath was replaced into the tract no complications were noted. Urine return was noted, 10 ml of sterile water was inflated into the balloon and a leg bag was attached for drainage.  Patient tolerated well. A night bag was given to patient and proper instruction was given on how to switch bags.    Performed by: KLevi Aland CMA  Follow up: Keep next scheduled apt.

## 2022-02-17 ENCOUNTER — Other Ambulatory Visit (HOSPITAL_COMMUNITY): Payer: Self-pay | Admitting: Interventional Radiology

## 2022-02-17 ENCOUNTER — Ambulatory Visit (HOSPITAL_COMMUNITY)
Admission: RE | Admit: 2022-02-17 | Discharge: 2022-02-17 | Disposition: A | Discharge status: Home / Self Care | Payer: Medicare Other | Source: Ambulatory Visit | Attending: Interventional Radiology | Admitting: Interventional Radiology

## 2022-02-17 DIAGNOSIS — Z436 Encounter for attention to other artificial openings of urinary tract: Secondary | ICD-10-CM | POA: Diagnosis not present

## 2022-02-17 DIAGNOSIS — N319 Neuromuscular dysfunction of bladder, unspecified: Secondary | ICD-10-CM

## 2022-02-17 DIAGNOSIS — Z466 Encounter for fitting and adjustment of urinary device: Secondary | ICD-10-CM | POA: Diagnosis not present

## 2022-02-17 HISTORY — PX: IR NEPHROSTOMY EXCHANGE LEFT: IMG6069

## 2022-02-17 MED ORDER — LIDOCAINE HCL 1 % IJ SOLN
INTRAMUSCULAR | Status: DC | PRN
  Administered 2022-02-17: 5 mL via INTRADERMAL

## 2022-02-17 MED ORDER — IOHEXOL 300 MG/ML  SOLN
50.0000 mL | Freq: Once | INTRAMUSCULAR | Status: AC | PRN
  Administered 2022-02-17: 5 mL

## 2022-02-17 MED ORDER — LIDOCAINE HCL 1 % IJ SOLN
INTRAMUSCULAR | Status: AC
  Filled 2022-02-17: qty 20

## 2022-02-18 ENCOUNTER — Ambulatory Visit (HOSPITAL_COMMUNITY)
Admission: RE | Admit: 2022-02-18 | Discharge: 2022-02-18 | Disposition: A | Discharge status: Home / Self Care | Payer: Medicare Other | Source: Ambulatory Visit | Attending: Urology | Admitting: Urology

## 2022-02-18 DIAGNOSIS — R109 Unspecified abdominal pain: Secondary | ICD-10-CM | POA: Diagnosis not present

## 2022-02-18 DIAGNOSIS — N200 Calculus of kidney: Secondary | ICD-10-CM | POA: Insufficient documentation

## 2022-02-19 ENCOUNTER — Telehealth (HOSPITAL_COMMUNITY): Payer: Self-pay | Admitting: Speech Pathology

## 2022-02-19 NOTE — Telephone Encounter (Signed)
Please review referral from Valentino Nose for an evaluation for a swallowing test.

## 2022-02-25 ENCOUNTER — Other Ambulatory Visit (HOSPITAL_COMMUNITY): Payer: Self-pay | Admitting: Specialist

## 2022-02-25 DIAGNOSIS — R1310 Dysphagia, unspecified: Secondary | ICD-10-CM

## 2022-02-25 DIAGNOSIS — T17308S Unspecified foreign body in larynx causing other injury, sequela: Secondary | ICD-10-CM

## 2022-03-04 DIAGNOSIS — H01123 Discoid lupus erythematosus of right eye, unspecified eyelid: Secondary | ICD-10-CM | POA: Diagnosis not present

## 2022-03-06 ENCOUNTER — Other Ambulatory Visit: Payer: Self-pay | Admitting: Neurology

## 2022-03-14 ENCOUNTER — Ambulatory Visit: Payer: Medicare Other | Admitting: Physician Assistant

## 2022-03-17 ENCOUNTER — Encounter (HOSPITAL_COMMUNITY): Payer: Self-pay | Admitting: Speech Pathology

## 2022-03-17 ENCOUNTER — Ambulatory Visit (HOSPITAL_COMMUNITY): Payer: Medicare Other | Admitting: Speech Pathology

## 2022-03-17 ENCOUNTER — Ambulatory Visit (HOSPITAL_COMMUNITY)
Admission: RE | Admit: 2022-03-17 | Discharge: 2022-03-17 | Disposition: A | Discharge status: Home / Self Care | Payer: Medicare Other | Source: Ambulatory Visit | Attending: Family Medicine | Admitting: Family Medicine

## 2022-03-17 DIAGNOSIS — R1310 Dysphagia, unspecified: Secondary | ICD-10-CM | POA: Diagnosis not present

## 2022-03-17 DIAGNOSIS — T17308S Unspecified foreign body in larynx causing other injury, sequela: Secondary | ICD-10-CM | POA: Diagnosis not present

## 2022-03-17 DIAGNOSIS — R1312 Dysphagia, oropharyngeal phase: Secondary | ICD-10-CM | POA: Insufficient documentation

## 2022-03-17 NOTE — Therapy (Signed)
[place], [place], [address] Phone: [phone]   Fax:  [phone]  Modified Barium Swallow  Patient Details  Name: Jordan Ward MRN: [id number] Date of Birth: 1941-05-18 No data recorded  Encounter Date: 03/17/2022   End of Session - 03/17/22 1646     Visit Number 1    Number of Visits 1    Authorization Type Medicare    SLP Start Time 1140    SLP Stop Time  6    SLP Time Calculation (min) 50 min    Activity Tolerance Patient tolerated treatment well             Past Medical History:  Diagnosis Date   Anemia    Arthritis    Back pain, chronic    Bilateral carotid bruits    C. difficile colitis 09/2011   CAD (coronary artery disease)    Carotid artery stenosis    Cerebrovascular disease    Cerebrovascular disease 08/17/2018   Colon polyps    Complication of cystostomy catheter, initial encounter ([place]) 04/20/2020   Dyslipidemia    Dysphagia 10/07/2011   Encephalopathy    Gait disorder    HA (headache)    High grade dysplasia in colonic adenoma 09/2005   History of kidney stones    HTN (hypertension), malignant 10/06/2011   Hypernatremia    Hypokalemia    Hypothyroidism 10/08/2011   Insomnia    Junctional rhythm    Kidney stones    MS (multiple sclerosis) (HCC)    Neuromuscular disorder (HCC)    MS   OSA (obstructive sleep apnea)    Paroxysmal atrial tachycardia ([place])    Peripheral vascular disease ([place])    Pneumonia 4 yrs ago   Pulmonary fibrosis ([place]) 10/06/2011   Pulmonary nodule 10/08/2011   PVD (peripheral vascular disease) ([place])    Sacral ulcer ([place])    Sleep apnea    cannot tolerate   Stroke ([place])    left sided weakness   Suprapubic catheter ([place])    TIA (transient ischemic attack)    Tremors of nervous system 10/08/2011   Urinary tract infection    Ventral hernia without obstruction or gangrene     Large 8X9cm ventral hernia with loss of domain. CT reads report as diastasis recti  with herniation or diastasis recti.  Dr. Constance Haw, Surgery, reviewed CT with radiology and there is herniation with only hernia sac or peritoneum over the bowel and large separation of the rectus muslce (i.e. diastasis recti aka loss of domain).  No surgical intervention recommended given size, age, and health.     Past Surgical History:  Procedure Laterality Date   APPENDECTOMY  09/2005   at time of left hemicolectomy   BACK SURGERY  1976/1979   lower   BILIARY DILATION N/A 03/03/2020   Procedure: BILIARY DILATION;  Surgeon: Rogene Houston, MD;  [facility];  Service: Endoscopy;  Laterality: N/A;   CATARACT EXTRACTION W/PHACO Right 03/08/2018   Procedure: CATARACT EXTRACTION PHACO AND INTRAOCULAR LENS PLACEMENT RIGHT EYE;  Surgeon: Tonny Branch, MD;  Location: [facility];  Service: Ophthalmology;  Laterality: Right;  CDE: 8.86   CATARACT EXTRACTION W/PHACO Left 04/05/2018   Procedure: CATARACT EXTRACTION PHACO AND INTRAOCULAR LENS PLACEMENT (IOC);  Surgeon: Tonny Branch, MD;  Location: [facility];  Service: Ophthalmology;  Laterality: Left;  CDE: 7.36   CENTRAL LINE INSERTION-TUNNELED Right 09/11/2020   Procedure: PLACEMENT OF TUNNELED CENTRAL LINE INTO JUGULAR VEIN;  Surgeon: Virl Cagey, MD;  Location: [facility];  Service: General;  Laterality: Right;   CHOLECYSTECTOMY     Dr. Tamala Julian   COLON SURGERY  09/2005   [place]man: four tubular adenomas, large adenomatous polyp with HIGH GRADE dysplasia   COLONOSCOPY  11/2004   Dr. Sharol Roussel sessile polyp splenic flexure, 53m sessile polyp desc colon, tubulovillous adenoma (bx not removed)   COLONOSCOPY  01/2005   poor prep, polyp could not be found   COLONOSCOPY  05/2005   with EMR, polypectomy Dr. JOlegario Messier bx showed high grade dysplasia, partially resected   COLONOSCOPY  09/2005   Dr. GArsenio Loader [employer] tattooing, four villous colon polyp (3 had been missed on previous colonoscopies due to limitations of procedures   COLONOSCOPY  09/2006    normal TI, no polyps   COLONOSCOPY  10/2007   Dr. RImogene Burndistal mammillations, benign bx, normal TI, random bx neg for microscopic colitis   CYSTOSCOPY W/ URETERAL STENT PLACEMENT Left 09/05/2020   Procedure: CYSTOSCOPY WITH RETROGRADE PYELOGRAM/URETERAL STENT PLACEMENT;  Surgeon: HArdis Hughs MD;  Location: [facility];  Service: Urology;  Laterality: Left;   CYSTOSCOPY WITH LITHOLAPAXY N/A 07/27/2018   Procedure: CYSTOSCOPY WITH LITHOLAPAXY VIA  SUPRAPUBIC TUBE;  Surgeon: DFranchot Gallo MD;  Location: [facility];  Service: Urology;  Laterality: N/A;   CYSTOSCOPY WITH RETROGRADE PYELOGRAM, URETEROSCOPY AND STENT PLACEMENT Left 06/09/2017   Procedure: CYSTOSCOPY WITH LEFT RETROGRADE PYELOGRAM, LEFT URETEROSCOPY, LEFT URETEROSCOPIC STONE EXTRACTION, LEFT URETERAL STENT PLACEMENT;  Surgeon: DFranchot Gallo MD;  Location: [facility];  Service: Urology;  Laterality: Left;   CYSTOSCOPY/URETEROSCOPY/HOLMIUM LASER/STENT PLACEMENT Left 05/22/2020   Procedure: CYSTOSCOPY/URETEROSCOPY/STENT PLACEMENT;  Surgeon: DFranchot Gallo MD;  Location: [facility];  Service: Urology;  Laterality: Left;   ERCP N/A 03/03/2020   Procedure: ENDOSCOPIC RETROGRADE CHOLANGIOPANCREATOGRAPHY (ERCP);  Surgeon: RRogene Houston MD;  [facility];  Service: Endoscopy;  Laterality: N/A;   ERCP N/A 04/20/2020   Procedure: ENDOSCOPIC RETROGRADE CHOLANGIOPANCREATOGRAPHY (ERCP);  Surgeon: RRogene Houston MD;  [facility];  Service: Endoscopy;  Laterality: N/A;  to [place] 7[place]am [place] 04/20/2020   Procedure: STENT REMOVAL;  Surgeon: RRogene Houston MD;  [facility];  Service: Endoscopy;  Laterality: N/A;   INGUINAL HERNIA REPAIR  1971   bilateral   INSERTION OF SUPRAPUBIC CATHETER  06/09/2017   Procedure: EXCHANGE OF SUPRAPUBIC CATHETER;  Surgeon: DFranchot Gallo MD;  Location: [facility];  Service: Urology;;   INSERTION OF SUPRAPUBIC CATHETER   05/22/2020   Procedure: SUPRAPUBIC CATHETER EXCHANGE;  Surgeon: DFranchot Gallo MD;  Location: [facility];  Service: Urology;;   IR NEPHROSTOMY EXCHANGE LEFT  03/25/2021   IR NEPHROSTOMY EXCHANGE LEFT  05/20/2021   IR NEPHROSTOMY EXCHANGE LEFT  07/13/2021   IR NEPHROSTOMY EXCHANGE LEFT  09/09/2021   IR NEPHROSTOMY EXCHANGE LEFT  11/04/2021   IR NEPHROSTOMY EXCHANGE LEFT  12/23/2021   IR NEPHROSTOMY EXCHANGE LEFT  02/17/2022   IR NEPHROSTOMY PLACEMENT LEFT  05/26/2017   IR NEPHROSTOMY PLACEMENT LEFT  05/02/2020   IR NEPHROSTOMY PLACEMENT LEFT  02/15/2021   IR URETERAL STENT PERC REMOVAL MOD SED  03/25/2021   KIDNEY STONE SURGERY  09/13/2015   LITHOTRIPSY N/A 03/03/2020   Procedure: MECHANICAL LITHOTRIPSY WITH REMOVAL OF MULTIPLE STONE FRAGMENTS;  Surgeon: RRogene Houston MD;  [facility];  Service: Endoscopy;  Laterality: N/A;   NEPHROLITHOTOMY Left 09/13/2015   Procedure: LEFT PERCUTANEOUS NEPHROLITHOTOMY ;  Surgeon: Franchot Gallo, MD;  Location: [facility];  Service: Urology;  Laterality: Left;   NEPHROSTOMY TUBE REMOVAL  05/22/2020   Procedure: NEPHROSTOMY TUBE REMOVAL;  Surgeon: Franchot Gallo, MD;  Location: [facility];  Service: Urology;;   [facility] REMOVAL Right 09/20/2020   Procedure: MINOR REMOVAL CENTRAL LINE;  Surgeon: Virl Cagey, MD;  Location: [facility];  Service: General;  Laterality: Right;  Pt to arrive at 7:30am for procedure   REMOVAL OF STONES N/A 04/20/2020   Procedure: REMOVAL OF STONES;  Surgeon: Rogene Houston, MD;  [facility];  Service: Endoscopy;  Laterality: N/A;   SPHINCTEROTOMY  03/03/2020   Procedure: BILLARY SPHINCTEROTOMY;  Surgeon: Rogene Houston, MD;  [facility];  Service: Endoscopy;;   SUPRAPUBIC CATHETER INSERTION      There were no vitals filed for this visit.   Subjective Assessment - 03/17/22 1622     Subjective "I have to have my food chopped up."    Special Tests MBSS    Currently in Pain? No/denies                  General - 03/17/22 1623       General Information   Date of Onset 02/19/22    HPI Jovonte Commins is a 80 yo male who was referred for MBSS by Valentino Nose, FNP due to reports of dysphagia from his wife. Pt is known to SLP service from previous MBSS (2016). He was hospitalized for COVID in 06/2020 and received home health SLP services following that admission for dysphagia. Pt consumes soft solids and thin liquids (he was on nectar-thick liquids in 2016). Pt has a h/o MS.    Type of Study MBS-Modified Barium Swallow Study    Previous Swallow Assessment 10/24/2014 with recommendation for D3/NTL and Mare Ferrari water protocol, small Zenker's    Diet Prior to this Study Dysphagia 3 (soft);Thin liquids    Temperature Spikes Noted No    Respiratory Status Room air    History of Recent Intubation No    Behavior/Cognition Alert;Cooperative;Pleasant mood    Oral Cavity Assessment Within Functional Limits    Oral Care Completed by SLP No    Oral Cavity - Dentition Adequate natural dentition    Vision Functional for self feeding    Self-Feeding Abilities Able to feed self    Patient Positioning Upright in chair    Baseline Vocal Quality Normal    Volitional Cough Strong    Volitional Swallow Able to elicit    Anatomy Within functional limits    Pharyngeal Secretions Not observed secondary MBS                Oral Preparation/Oral Phase - 03/17/22 1629       Oral Preparation/Oral Phase   Oral Phase Impaired      Oral - Honey   Oral - Honey Cup Decreased bolus cohesion      Oral - Nectar   Oral - Nectar Cup Decreased bolus cohesion      Oral - Thin   Oral - Thin Teaspoon Within functional limits    Oral - Thin Cup Decreased bolus cohesion;Oral residue      Oral - Solids   Oral - Puree Within functional limits    Oral - Regular Decreased bolus cohesion    Oral - Pill Within functional limits   in puree     Electrical stimulation - Oral Phase   Was Electrical  Stimulation Used No  Pharyngeal Phase - 03/17/22 1632       Pharyngeal Phase   Pharyngeal Phase Impaired      Pharyngeal - Honey   Pharyngeal- Honey Cup Swallow initiation at vallecula;Pharyngeal residue - cp segment   backflow from Zenker's     Pharyngeal - Nectar   Pharyngeal- Nectar Cup Swallow initiation at vallecula;Penetration/Aspiration during swallow;Pharyngeal residue - cp segment;Pharyngeal residue - valleculae;Penetration/Apiration after swallow;Trace aspiration    Pharyngeal Material enters airway, passes BELOW cords and not ejected out despite cough attempt by patient      Pharyngeal - Thin   Pharyngeal- Thin Teaspoon Swallow initiation at pyriform sinus;Penetration/Aspiration during swallow;Trace aspiration;Penetration/Apiration after swallow;Pharyngeal residue - valleculae;Reduced airway/laryngeal closure;Inter-arytenoid space residue    Pharyngeal Material enters airway, passes BELOW cords and not ejected out despite cough attempt by patient    Pharyngeal- Thin Cup Swallow initiation at pyriform sinus;Reduced airway/laryngeal closure;Penetration/Aspiration before swallow;Penetration/Aspiration during swallow;Penetration/Apiration after swallow;Trace aspiration;Moderate aspiration;Pharyngeal residue - valleculae    Pharyngeal Material enters airway, passes BELOW cords and not ejected out despite cough attempt by patient      Pharyngeal - Solids   Pharyngeal- Puree Reduced tongue base retraction;Pharyngeal residue - valleculae;Pharyngeal residue - cp segment   backflow from Zenker's to pyriforms   Pharyngeal- Pill Reduced tongue base retraction;Pharyngeal residue - valleculae;Pharyngeal residue - pyriform      Electrical Stimulation - Pharyngeal Phase   Was Electrical Stimulation Used No              Cricopharyngeal Phase - 03/17/22 1640       Cervical Esophageal Phase   Cervical Esophageal Phase Impaired      Cervical Esophageal Phase - Solids    Puree Prominent cricopharyngeal segment;Esophageal backflow into the pharynx      Cervical Esophageal Phase - Comment   Cervical Esophageal Comment Noted zenker's diverticulum (radiologist confirmed) with backflow from Zenker's going into the pyriforms and occasionally aspirated    Other Esophageal Phase Observations delayed emptying                      Plan - 03/17/22 1646     Clinical Impression Statement Pt presents with moderate oropharyngeal phase dysphagia and primary esophageal dysphagia characterized by impaired lingual movement with reduced bolus cohesion, premature spillage, delay in swallow trigger at the level of the pyriforms with thins (valleculae with HTL and NTL), penetration of thins during the swallow and aspiration of thins (tsp/cup) after the swallow from residuals on the underside of the epiglottis and between the arytenoids running down between the vocal folds. Aspiration occurred in trace and moderate amounts and were generally sensed after a delay with a strong cough, but not removed. Pt also noted to have a Zenker's diverticulum which filled with barium and then backflowed into the pharynx (trace amounts aspirated after the swallow). Pt trialed a modified supraglottic and chin tuck, however found to be ineffective. Pt with reduced tongue base retraction with moderate vallecular residue after swallowing regular textures. Pt encouraged to clear throat and repeat/dry swallow frequently throughout the study which did result and some removal from the laryngeal vestibule. Pt with seemingly improved pharyngeal clearance with honey-thick liquids (no penetration), however this also filled Zenker's and backflowed into the pyriforms. Pt has a long standing history of dysphagia and has likely been aspirating liquids for years. He was deconditioned after COVID and also has a Zenker's diverticulum. He reports that he primarily consumes    Treatment/Interventions Aspiration  precaution training;SLP instruction and feedback  Potential to Achieve Goals Fair    Potential Considerations Severity of impairments;Previous level of function    Consulted and Agree with Plan of Care Patient;Family member/caregiver             Patient will benefit from skilled therapeutic intervention in order to improve the following deficits and impairments:   Dysphagia, oropharyngeal phase     Recommendations/Treatment - 03/17/22 1641       Swallow Evaluation Recommendations   Recommended Consults Consider GI evaluation   due to Zenker's   SLP Diet Recommendations Thin;Dysphagia 3 (mechanical soft);Honey   see summary- Pt has been drinking thin liquids for years with suspected aspiration in that time   Liquid Administration via Cup;No straw    Medication Administration Whole meds with puree    Supervision Patient able to self feed    Compensations Slow rate;Small sips/bites;Multiple dry swallows after each bite/sip;Clear throat after each swallow;Hard cough after swallow    Postural Changes Seated upright at 90 degrees;Remain upright for at least 30 minutes after feeds/meals              Prognosis - 03/17/22 1645       Prognosis   Prognosis for Safe Diet Advancement Fair    Barriers to Reach Goals Severity of deficits      Individuals Consulted   Consulted and Agree with Results and Recommendations Patient;Family member/caregiver    Family Member Consulted spouse    Report Sent to  Referring physician             Problem List Patient Active Problem List   Diagnosis Date Noted   Pressure ulcer, heel 07/14/2021   Rigors 03/07/2021   Hydronephrosis, left 02/13/2021   Encounter for removal of vascular catheter 09/14/2020   Acute respiratory failure with hypoxia ([place]) 07/12/2020   Pneumonia due to COVID-19 virus 07/12/2020   Encephalopathy due to COVID-19 virus 07/12/2020   Acute hypoxemic respiratory failure due to COVID-19 ([place]) 07/03/2020   Calculus  of ureter 05/15/2020   Bacteremia due to Gram-negative bacteria 05/04/2020   Acute lower UTI 05/04/2020   Acute unilateral obstructive uropathy    SIRS (systemic inflammatory response syndrome) ([place]) 05/03/2020   Hydronephrosis 26/83/4196   Complication of cystostomy catheter, subsequent encounter 04/20/2020   Acute kidney injury superimposed on CKD ([place]) 03/02/2020   Dehydration 03/02/2020   Nausea and vomiting 03/02/2020   Ventral hernia without obstruction or gangrene 03/01/2020   Hypotension 03/01/2020   Poor appetite 03/01/2020   Urinary tract infection 03/01/2020   Detrusor areflexia 09/16/2019   Cerebrovascular disease 08/17/2018   Partial small bowel obstruction ([place]) 07/11/2017   Choledocholithiasis 22/29/7989   Complicated UTI (urinary tract infection)    Polymicrobial septicemia ([place]) 06/14/2017   Paroxysmal atrial tachycardia ([place]) 06/14/2017   Hypophosphatemia 06/14/2017   Pressure injury of skin 05/26/2017   Hydronephrosis due to obstruction of ureter 05/26/2017   Obstructive uropathy 05/25/2017   CKD (chronic kidney disease), stage III ([place]) 08/23/2016   Hydronephrosis of left kidney 08/23/2016   Kidney stones 09/13/2015   Essential hypertension    Pressure ulcer 08/04/2015   Chronic diastolic (congestive) heart failure ([place]) 08/04/2015   Hydronephrosis with obstructing calculus 08/04/2015   Occult blood positive stool 09/05/2014   Rash and nonspecific skin eruption 08/25/2014   Edema 08/19/2014   Subacute confusional state 08/06/2014   Dilated cardiomyopathy ([place]) 07/21/2014   Sepsis ([place]) 07/17/2014   Severe sepsis ([place]) 07/17/2014   Headache 03/13/2014   CVA (cerebral infarction) 03/13/2014  Abnormality of gait 03/13/2014   Tremors of nervous system 10/08/2011   Hypothyroidism 10/08/2011   Pulmonary nodule 10/08/2011   Dysphagia 10/07/2011   HTN (hypertension), malignant 10/06/2011   Chronic suprapubic catheter ([place]) 10/06/2011   Pulmonary fibrosis  ([place]) 10/06/2011   UTI (urinary tract infection) 10/03/2011   PNA (pneumonia) 10/02/2011   Junctional rhythm 10/02/2011   Multiple sclerosis ([place]) 10/02/2011   Chronic diarrhea 06/17/2011   Hx of adenomatous colonic polyps 06/17/2011   High grade dysplasia in colonic adenoma 09/29/2005   Thank you,  Genene Churn, [place]  Genene Churn, [place] 03/17/2022, 5:05 PM  [place], [place], [address] Phone: [phone]   Fax:  [phone]  Name: Jordan Ward MRN: [id number] Date of Birth: 19-Dec-1940

## 2022-03-19 ENCOUNTER — Ambulatory Visit: Payer: Medicare Other | Admitting: Physician Assistant

## 2022-03-20 ENCOUNTER — Ambulatory Visit (INDEPENDENT_AMBULATORY_CARE_PROVIDER_SITE_OTHER): Payer: Medicare Other | Admitting: Physician Assistant

## 2022-03-20 DIAGNOSIS — M79674 Pain in right toe(s): Secondary | ICD-10-CM | POA: Diagnosis not present

## 2022-03-20 DIAGNOSIS — M79675 Pain in left toe(s): Secondary | ICD-10-CM | POA: Diagnosis not present

## 2022-03-20 DIAGNOSIS — R339 Retention of urine, unspecified: Secondary | ICD-10-CM | POA: Diagnosis not present

## 2022-03-20 DIAGNOSIS — L859 Epidermal thickening, unspecified: Secondary | ICD-10-CM | POA: Diagnosis not present

## 2022-03-20 DIAGNOSIS — L97319 Non-pressure chronic ulcer of right ankle with unspecified severity: Secondary | ICD-10-CM | POA: Diagnosis not present

## 2022-03-20 DIAGNOSIS — M2042 Other hammer toe(s) (acquired), left foot: Secondary | ICD-10-CM | POA: Diagnosis not present

## 2022-03-20 DIAGNOSIS — B351 Tinea unguium: Secondary | ICD-10-CM | POA: Diagnosis not present

## 2022-03-20 NOTE — Progress Notes (Signed)
Suprapubic Cath Change  Patient is present today for a suprapubic catheter change due to urinary retention.  56m of water was drained from the balloon, a 24FR foley cath was removed from the tract with out difficulty.  Site was cleaned and prepped in a sterile fashion with betadine.  A 24FR foley cath was replaced into the tract no complications were noted. Urine return was noted, 10 ml of sterile water was inflated into the balloon and a leg bag was attached for drainage.  Patient tolerated well. A night bag was given to patient and proper instruction was given on how to switch bags.    Performed by: KLevi Aland CMA  Follow up: Flushed catheter with 445mof stertile water with 4014[place].  Patient to follow up with MD in 1 month with catheter change.  1 month sample of '50mg'$  Myrbetriq provided today.

## 2022-04-03 DIAGNOSIS — L97319 Non-pressure chronic ulcer of right ankle with unspecified severity: Secondary | ICD-10-CM | POA: Diagnosis not present

## 2022-04-03 DIAGNOSIS — L97529 Non-pressure chronic ulcer of other part of left foot with unspecified severity: Secondary | ICD-10-CM | POA: Diagnosis not present

## 2022-04-04 ENCOUNTER — Other Ambulatory Visit: Payer: Self-pay | Admitting: Neurology

## 2022-04-04 ENCOUNTER — Telehealth: Payer: Self-pay | Admitting: Neurology

## 2022-04-04 MED ORDER — DIAZEPAM 5 MG PO TABS
ORAL_TABLET | ORAL | 0 refills | Status: DC
Start: 2022-04-04 — End: 2022-04-08

## 2022-04-04 NOTE — Telephone Encounter (Signed)
Patient on diazepam. Last ntestated pcp to refill because he gets opioids from pcp as well. Patient stated Dr. Nevada Crane will not fill. I will refill until Monday but I agree the combination of benzos and opioids can be dangerous and should be managed by one prescriber. Please call patient next week and decide who will fill what thanks.

## 2022-04-07 NOTE — Telephone Encounter (Signed)
Could you put this patient on my schedule as a telephone or virtual visit to discuss his medication plan? Thanks

## 2022-04-08 ENCOUNTER — Encounter: Payer: Self-pay | Admitting: *Deleted

## 2022-04-08 NOTE — Addendum Note (Signed)
Addended by: Noberto Retort C on: 04/08/2022 08:07 AM   Modules accepted: Orders

## 2022-04-08 NOTE — Telephone Encounter (Signed)
I spoke to the patient's wife. She was able to get Jordan Nose, NP at his PCP office to fill his diazepam. Per [facility], he picked up #60 on 04/04/22 (from the NP). I will call [facility] and cancel the 14 tablets sent in by Dr. Jaynee Eagles. Since his PCP office will continue filling, no appt was scheduled.

## 2022-04-14 ENCOUNTER — Other Ambulatory Visit (HOSPITAL_COMMUNITY): Payer: Self-pay | Admitting: Interventional Radiology

## 2022-04-14 ENCOUNTER — Ambulatory Visit (HOSPITAL_COMMUNITY)
Admission: RE | Admit: 2022-04-14 | Discharge: 2022-04-14 | Disposition: A | Discharge status: Home / Self Care | Payer: Medicare Other | Source: Ambulatory Visit | Attending: Interventional Radiology | Admitting: Interventional Radiology

## 2022-04-14 DIAGNOSIS — N319 Neuromuscular dysfunction of bladder, unspecified: Secondary | ICD-10-CM

## 2022-04-14 DIAGNOSIS — Z436 Encounter for attention to other artificial openings of urinary tract: Secondary | ICD-10-CM | POA: Insufficient documentation

## 2022-04-14 DIAGNOSIS — Z466 Encounter for fitting and adjustment of urinary device: Secondary | ICD-10-CM | POA: Diagnosis not present

## 2022-04-14 HISTORY — PX: IR NEPHROSTOMY EXCHANGE LEFT: IMG6069

## 2022-04-14 MED ORDER — IOHEXOL 300 MG/ML  SOLN
20.0000 mL | Freq: Once | INTRAMUSCULAR | Status: AC | PRN
  Administered 2022-04-14: 10 mL

## 2022-04-14 MED ORDER — LIDOCAINE HCL 1 % IJ SOLN
INTRAMUSCULAR | Status: AC
  Filled 2022-04-14: qty 20

## 2022-04-14 NOTE — Progress Notes (Signed)
History of Present Illness:   He has a neurogenic bladder and has had a longstanding suprapubic tube.  Currently, output is not great as he has an atrophic right  kidney and a left percutaneous nephrostomy.  He still has bladder spasms and is on Myrbetriq 25 mg a day.   He has recurrent urolithiasis, left renal unit.  Because of multiple ureteral stones and longstanding hydronephrosis, he had a left-sided nephrostomy tube placed in May, 2022.     CT abdomen and pelvis for follow-up of urolithiasis was performed 12.11.2022 revealing hydronephrosis and misplaced percutaneous tube.  He did have a 27m left renal pelvic stone at that time.   1.31.2023: Here because of recent fever/probable UTI.  Had a suprapubic tube changed on the 27th.     4.18.2023: Here today for routine check.  Has suprapubic tube scheduled to be changed today.  In early 2023 his left renal pelvic stone was 16 mm in size.  He is having no symptoms from that.  The large majority of his urine comes out his left nephrostomy tube.  No recent treatment for urinary tract infections.  7.18.2023: Here for f/u. KUB 5.23.203 revealed continued 16 mm Lt renal stone.  Past Medical History:  Diagnosis Date   Anemia    Arthritis    Back pain, chronic    Bilateral carotid bruits    C. difficile colitis 09/2011   CAD (coronary artery disease)    Carotid artery stenosis    Cerebrovascular disease    Cerebrovascular disease 08/17/2018   Colon polyps    Complication of cystostomy catheter, initial encounter ([facility] 04/20/2020   Dyslipidemia    Dysphagia 10/07/2011   Encephalopathy    Gait disorder    HA (headache)    High grade dysplasia in colonic adenoma 09/2005   History of kidney stones    HTN (hypertension), malignant 10/06/2011   Hypernatremia    Hypokalemia    Hypothyroidism 10/08/2011   Insomnia    Junctional rhythm    Kidney stones    MS (multiple sclerosis) (HCC)    Neuromuscular disorder (HCC)    MS   OSA (obstructive sleep  apnea)    Paroxysmal atrial tachycardia ([facility]    Peripheral vascular disease ([facility]    Pneumonia 4 yrs ago   [place]ulmonary fibrosis [place] 10/06/2011   Pulmonary nodule 10/08/2011   PVD (peripheral vascular disease) ([facility]    Sacral ulcer (HCC)    Sleep apnea    cannot tolerate   Stroke ([facility]    left sided weakness   Suprapubic catheter ([facility]    TIA (transient ischemic attack)    Tremors of nervous system 10/08/2011   Urinary tract infection    Ventral hernia without obstruction or gangrene     Large 8X9cm ventral hernia with loss of domain. CT reads report as diastasis recti with herniation or diastasis recti.  Dr. BConstance Haw Surgery, reviewed CT with radiology and there is herniation with only hernia sac or peritoneum over the bowel and large separation of the rectus muslce (i.e. diastasis recti aka loss of domain).  No surgical intervention recommended given size, age, and health.     Past Surgical History:  Procedure Laterality Date   APPENDECTOMY  09/2005   at time of left hemicolectomy   BACK SURGERY  1976/1979   lower   BILIARY DILATION N/A 03/03/2020   Procedure: BILIARY DILATION;  Surgeon: RRogene Houston MD;  [facility];  Service: Endoscopy;  Laterality: N/A;  CATARACT EXTRACTION W/PHACO Right 03/08/2018   Procedure: CATARACT EXTRACTION PHACO AND INTRAOCULAR LENS PLACEMENT RIGHT EYE;  Surgeon: Tonny Branch, MD;  Location: [facility];  Service: Ophthalmology;  Laterality: Right;  CDE: 8.86   CATARACT EXTRACTION W/PHACO Left 04/05/2018   Procedure: CATARACT EXTRACTION PHACO AND INTRAOCULAR LENS PLACEMENT (IOC);  Surgeon: Tonny Branch, MD;  Location: [facility];  Service: Ophthalmology;  Laterality: Left;  CDE: 7.36   CENTRAL LINE INSERTION-TUNNELED Right 09/11/2020   Procedure: PLACEMENT OF TUNNELED CENTRAL LINE INTO JUGULAR VEIN;  Surgeon: Virl Cagey, MD;  Location: [facility];  Service: General;  Laterality: Right;   CHOLECYSTECTOMY     Dr. Tamala Julian   COLON SURGERY  09/2005    Fleishman: four tubular adenomas, large adenomatous polyp with HIGH GRADE dysplasia   COLONOSCOPY  11/2004   Dr. Sharol Roussel sessile polyp splenic flexure, 49m sessile polyp desc colon, tubulovillous adenoma (bx not removed)   COLONOSCOPY  01/2005   poor prep, polyp could not be found   COLONOSCOPY  05/2005   with EMR, polypectomy Dr. JOlegario Messier bx showed high grade dysplasia, partially resected   COLONOSCOPY  09/2005   Dr. GArsenio Loader [employer] tattooing, four villous colon polyp (3 had been missed on previous colonoscopies due to limitations of procedures   COLONOSCOPY  09/2006   normal TI, no polyps   COLONOSCOPY  10/2007   Dr. RImogene Burndistal mammillations, benign bx, normal TI, random bx neg for microscopic colitis   CYSTOSCOPY W/ URETERAL STENT PLACEMENT Left 09/05/2020   Procedure: CYSTOSCOPY WITH RETROGRADE PYELOGRAM/URETERAL STENT PLACEMENT;  Surgeon: HArdis Hughs MD;  Location: [facility];  Service: Urology;  Laterality: Left;   CYSTOSCOPY WITH LITHOLAPAXY N/A 07/27/2018   Procedure: CYSTOSCOPY WITH LITHOLAPAXY VIA  SUPRAPUBIC TUBE;  Surgeon: DFranchot Gallo MD;  Location: [facility];  Service: Urology;  Laterality: N/A;   CYSTOSCOPY WITH RETROGRADE PYELOGRAM, URETEROSCOPY AND STENT PLACEMENT Left 06/09/2017   Procedure: CYSTOSCOPY WITH LEFT RETROGRADE PYELOGRAM, LEFT URETEROSCOPY, LEFT URETEROSCOPIC STONE EXTRACTION, LEFT URETERAL STENT PLACEMENT;  Surgeon: DFranchot Gallo MD;  Location: [facility];  Service: Urology;  Laterality: Left;   CYSTOSCOPY/URETEROSCOPY/HOLMIUM LASER/STENT PLACEMENT Left 05/22/2020   Procedure: CYSTOSCOPY/URETEROSCOPY/STENT PLACEMENT;  Surgeon: DFranchot Gallo MD;  Location: [facility];  Service: Urology;  Laterality: Left;   ERCP N/A 03/03/2020   Procedure: ENDOSCOPIC RETROGRADE CHOLANGIOPANCREATOGRAPHY (ERCP);  Surgeon: RRogene Houston MD;  [facility];  Service: Endoscopy;  Laterality: N/A;   ERCP N/A 04/20/2020   Procedure: ENDOSCOPIC  RETROGRADE CHOLANGIOPANCREATOGRAPHY (ERCP);  Surgeon: RRogene Houston MD;  [facility];  Service: Endoscopy;  Laterality: N/A;  [place] 04[place]   Procedure: STENT REMOVAL;  Surgeon: RRogene Houston MD;  [facility];  Service: Endoscopy;  Laterality: N/A;   INGUINAL HERNIA REPAIR  1971   bilateral   INSERTION OF SUPRAPUBIC CATHETER  06/09/2017   Procedure: EXCHANGE OF SUPRAPUBIC CATHETER;  Surgeon: DFranchot Gallo MD;  Location: [facility];  Service: Urology;;   INSERTION OF SUPRAPUBIC CATHETER  05/22/2020   Procedure: SUPRAPUBIC CATHETER EXCHANGE;  Surgeon: DFranchot Gallo MD;  Location: [facility];  Service: Urology;;   IR NEPHROSTOMY EXCHANGE LEFT  03/25/2021   IR NEPHROSTOMY EXCHANGE LEFT  05/20/2021   IR NEPHROSTOMY EXCHANGE LEFT  07/13/2021   IR NEPHROSTOMY EXCHANGE LEFT  09/09/2021   IR NEPHROSTOMY EXCHANGE LEFT  11/04/2021   IR NEPHROSTOMY EXCHANGE LEFT  12/23/2021   IR NEPHROSTOMY  EXCHANGE LEFT  02/17/2022   IR NEPHROSTOMY EXCHANGE LEFT  04/14/2022   IR NEPHROSTOMY PLACEMENT LEFT  05/26/2017   IR NEPHROSTOMY PLACEMENT LEFT  05/02/2020   IR NEPHROSTOMY PLACEMENT LEFT  02/15/2021   IR URETERAL STENT PERC REMOVAL MOD SED  03/25/2021   KIDNEY STONE SURGERY  09/13/2015   LITHOTRIPSY N/A 03/03/2020   Procedure: MECHANICAL LITHOTRIPSY WITH REMOVAL OF MULTIPLE STONE FRAGMENTS;  Surgeon: Rogene Houston, MD;  [facility];  Service: Endoscopy;  Laterality: N/A;   NEPHROLITHOTOMY Left 09/13/2015   Procedure: LEFT PERCUTANEOUS NEPHROLITHOTOMY ;  Surgeon: Franchot Gallo, MD;  Location: [facility];  Service: Urology;  Laterality: Left;   NEPHROSTOMY TUBE REMOVAL  05/22/2020   Procedure: NEPHROSTOMY TUBE REMOVAL;  Surgeon: Franchot Gallo, MD;  Location: [facility];  Service: Urology;;   [facility] REMOVAL Right 09/20/2020   Procedure: MINOR REMOVAL CENTRAL LINE;  Surgeon: Virl Cagey, MD;  Location: [facility];   Service: General;  Laterality: Right;  Pt to arrive at 7:30am for procedure   REMOVAL OF STONES N/A 04/20/2020   Procedure: REMOVAL OF STONES;  Surgeon: Rogene Houston, MD;  [facility];  Service: Endoscopy;  Laterality: N/A;   SPHINCTEROTOMY  03/03/2020   Procedure: BILLARY SPHINCTEROTOMY;  Surgeon: Rogene Houston, MD;  [facility];  Service: Endoscopy;;   SUPRAPUBIC CATHETER INSERTION      Home Medications:  Allergies as of 04/15/2022       Reactions   Tetracyclines & Related Anaphylaxis, Rash   Ciprofloxacin    Trouble swallowing unknown reaction according to wife         Medication List        Accurate as of April 14, 2022  7:47 PM. If you have any questions, ask your nurse or doctor.          acetaminophen 325 MG tablet Commonly known as: TYLENOL Take 650 mg by mouth every 6 (six) hours as needed.   amLODipine 10 MG tablet Commonly known as: NORVASC Take 10 mg by mouth daily.   diazepam 5 MG tablet Commonly known as: VALIUM Take 5 mg by mouth every 12 (twelve) hours as needed for muscle spasms. RX prescribed by his PCP office Valentino Nose, NP).   GABAPENTIN PO Take by mouth.   gabapentin 100 MG capsule Commonly known as: NEURONTIN Take 100 mg by mouth 3 (three) times daily as needed.   HYDROcodone-acetaminophen 7.5-325 MG tablet Commonly known as: NORCO Take 1 tablet by mouth 2 (two) times daily. Max APAP 3 GM IN 24 HOURS FROM ALL SOURCES What changed:  when to take this additional instructions   levofloxacin 500 MG tablet Commonly known as: LEVAQUIN Take 1 as directed   levothyroxine 75 MCG tablet Commonly known as: SYNTHROID Take 75 mcg by mouth daily.   OXYGEN Inhale 1 L into the lungs every evening.   potassium chloride SA 20 MEQ tablet Commonly known as: KLOR-CON M Take 20 mEq by mouth daily.        Allergies:  Allergies  Allergen Reactions   Tetracyclines & Related Anaphylaxis and Rash   Ciprofloxacin      Trouble swallowing unknown reaction according to wife     Family History  Problem Relation Age of Onset   Cirrhosis Brother        etoh   Stroke Mother 37   Coronary artery disease Father 72   Heart attack Brother    Cancer Sister    Multiple  sclerosis Other    Colon cancer Neg Hx     Social History:  reports that he quit smoking about 34 years ago. His smoking use included cigarettes. He has a 25.00 pack-year smoking history. He has never used smokeless tobacco. He reports that he does not drink alcohol and does not use drugs.  ROS: A complete review of systems was performed.  All systems are negative except for pertinent findings as noted.  Physical Exam:  Vital signs in last 24 hours: There were no vitals taken for this visit. Constitutional:  Alert and oriented, No acute distress Cardiovascular: Regular rate  Respiratory: Normal respiratory effort GI: Abdomen is soft, nontender, nondistended, no abdominal masses. No CVAT.  Genitourinary: Normal male phallus, testes are descended bilaterally and non-tender and without masses, scrotum is normal in appearance without lesions or masses, perineum is normal on inspection. Lymphatic: No lymphadenopathy Neurologic: Grossly intact, no focal deficits Psychiatric: Normal mood and affect  I have reviewed prior pt notes  I have reviewed notes from referring/previous physicians  I have reviewed urinalysis results  I have independently reviewed prior imaging  I have reviewed prior PSA results  I have reviewed prior urine culture   Impression/Assessment:  ***  Plan:  ***

## 2022-04-15 ENCOUNTER — Ambulatory Visit (INDEPENDENT_AMBULATORY_CARE_PROVIDER_SITE_OTHER): Payer: Medicare Other | Admitting: Urology

## 2022-04-15 ENCOUNTER — Encounter: Payer: Self-pay | Admitting: Urology

## 2022-04-15 VITALS — BP 140/72 | HR 73 | Ht 65.0 in | Wt 112.0 lb

## 2022-04-15 DIAGNOSIS — N200 Calculus of kidney: Secondary | ICD-10-CM

## 2022-04-15 DIAGNOSIS — N312 Flaccid neuropathic bladder, not elsewhere classified: Secondary | ICD-10-CM

## 2022-04-15 DIAGNOSIS — R339 Retention of urine, unspecified: Secondary | ICD-10-CM | POA: Diagnosis not present

## 2022-04-15 DIAGNOSIS — Z435 Encounter for attention to cystostomy: Secondary | ICD-10-CM | POA: Diagnosis not present

## 2022-04-15 MED ORDER — LEVOFLOXACIN 500 MG PO TABS: ORAL_TABLET | ORAL | 1 refills | Status: DC

## 2022-04-15 NOTE — Progress Notes (Signed)
Suprapubic Cath Change  Patient is present today for a suprapubic catheter change due to urinary retention.  15m of water was drained from the balloon, a 24FR foley cath was removed from the tract with out difficulty.  Site was cleaned and prepped in a sterile fashion with betadine.  A 24FR foley cath was replaced into the tract no complications were noted. Urine return was noted, 10 ml of sterile water was inflated into the balloon and a leg bag was attached for drainage.  Patient tolerated well.  Catheter was flushed with 252mof sterile water with return.  Performed by: KoLevi AlandCMA  Follow up: Follow up as scheduled.

## 2022-05-18 ENCOUNTER — Other Ambulatory Visit: Payer: Self-pay

## 2022-05-18 ENCOUNTER — Inpatient Hospital Stay (HOSPITAL_COMMUNITY)
Admission: EM | Admit: 2022-05-18 | Discharge: 2022-05-22 | DRG: 698 | Disposition: A | Discharge status: Home Health | Payer: Medicare Other | Attending: Internal Medicine | Admitting: Internal Medicine

## 2022-05-18 ENCOUNTER — Emergency Department (HOSPITAL_COMMUNITY): Payer: Medicare Other

## 2022-05-18 ENCOUNTER — Encounter (HOSPITAL_COMMUNITY): Payer: Self-pay | Admitting: *Deleted

## 2022-05-18 DIAGNOSIS — Z823 Family history of stroke: Secondary | ICD-10-CM | POA: Diagnosis not present

## 2022-05-18 DIAGNOSIS — Z20822 Contact with and (suspected) exposure to covid-19: Secondary | ICD-10-CM | POA: Diagnosis present

## 2022-05-18 DIAGNOSIS — Z9841 Cataract extraction status, right eye: Secondary | ICD-10-CM

## 2022-05-18 DIAGNOSIS — E876 Hypokalemia: Secondary | ICD-10-CM | POA: Diagnosis not present

## 2022-05-18 DIAGNOSIS — N179 Acute kidney failure, unspecified: Secondary | ICD-10-CM | POA: Diagnosis not present

## 2022-05-18 DIAGNOSIS — E039 Hypothyroidism, unspecified: Secondary | ICD-10-CM | POA: Diagnosis not present

## 2022-05-18 DIAGNOSIS — N1832 Chronic kidney disease, stage 3b: Secondary | ICD-10-CM | POA: Diagnosis present

## 2022-05-18 DIAGNOSIS — Z8673 Personal history of transient ischemic attack (TIA), and cerebral infarction without residual deficits: Secondary | ICD-10-CM

## 2022-05-18 DIAGNOSIS — R197 Diarrhea, unspecified: Secondary | ICD-10-CM | POA: Diagnosis not present

## 2022-05-18 DIAGNOSIS — J8489 Other specified interstitial pulmonary diseases: Secondary | ICD-10-CM | POA: Diagnosis not present

## 2022-05-18 DIAGNOSIS — T83518A Infection and inflammatory reaction due to other urinary catheter, initial encounter: Secondary | ICD-10-CM | POA: Diagnosis present

## 2022-05-18 DIAGNOSIS — K5909 Other constipation: Secondary | ICD-10-CM | POA: Diagnosis present

## 2022-05-18 DIAGNOSIS — Z8249 Family history of ischemic heart disease and other diseases of the circulatory system: Secondary | ICD-10-CM | POA: Diagnosis not present

## 2022-05-18 DIAGNOSIS — I129 Hypertensive chronic kidney disease with stage 1 through stage 4 chronic kidney disease, or unspecified chronic kidney disease: Secondary | ICD-10-CM | POA: Diagnosis present

## 2022-05-18 DIAGNOSIS — G35 Multiple sclerosis: Secondary | ICD-10-CM | POA: Diagnosis present

## 2022-05-18 DIAGNOSIS — R6520 Severe sepsis without septic shock: Secondary | ICD-10-CM | POA: Diagnosis present

## 2022-05-18 DIAGNOSIS — N189 Chronic kidney disease, unspecified: Secondary | ICD-10-CM | POA: Diagnosis present

## 2022-05-18 DIAGNOSIS — I739 Peripheral vascular disease, unspecified: Secondary | ICD-10-CM | POA: Diagnosis present

## 2022-05-18 DIAGNOSIS — R5381 Other malaise: Secondary | ICD-10-CM | POA: Diagnosis present

## 2022-05-18 DIAGNOSIS — Z87442 Personal history of urinary calculi: Secondary | ICD-10-CM

## 2022-05-18 DIAGNOSIS — Z86010 Personal history of colonic polyps: Secondary | ICD-10-CM

## 2022-05-18 DIAGNOSIS — A419 Sepsis, unspecified organism: Secondary | ICD-10-CM | POA: Diagnosis present

## 2022-05-18 DIAGNOSIS — I700 Atherosclerosis of aorta: Secondary | ICD-10-CM | POA: Diagnosis not present

## 2022-05-18 DIAGNOSIS — Z79899 Other long term (current) drug therapy: Secondary | ICD-10-CM

## 2022-05-18 DIAGNOSIS — N390 Urinary tract infection, site not specified: Secondary | ICD-10-CM

## 2022-05-18 DIAGNOSIS — Z79890 Hormone replacement therapy (postmenopausal): Secondary | ICD-10-CM

## 2022-05-18 DIAGNOSIS — R000 Tachycardia, unspecified: Secondary | ICD-10-CM | POA: Diagnosis not present

## 2022-05-18 DIAGNOSIS — J8410 Pulmonary fibrosis, unspecified: Secondary | ICD-10-CM | POA: Diagnosis present

## 2022-05-18 DIAGNOSIS — J9601 Acute respiratory failure with hypoxia: Secondary | ICD-10-CM | POA: Diagnosis not present

## 2022-05-18 DIAGNOSIS — Z9981 Dependence on supplemental oxygen: Secondary | ICD-10-CM | POA: Diagnosis not present

## 2022-05-18 DIAGNOSIS — I2510 Atherosclerotic heart disease of native coronary artery without angina pectoris: Secondary | ICD-10-CM | POA: Diagnosis present

## 2022-05-18 DIAGNOSIS — J189 Pneumonia, unspecified organism: Secondary | ICD-10-CM

## 2022-05-18 DIAGNOSIS — Z28310 Unvaccinated for covid-19: Secondary | ICD-10-CM

## 2022-05-18 DIAGNOSIS — E785 Hyperlipidemia, unspecified: Secondary | ICD-10-CM | POA: Diagnosis present

## 2022-05-18 DIAGNOSIS — Y846 Urinary catheterization as the cause of abnormal reaction of the patient, or of later complication, without mention of misadventure at the time of the procedure: Secondary | ICD-10-CM | POA: Diagnosis present

## 2022-05-18 DIAGNOSIS — Z961 Presence of intraocular lens: Secondary | ICD-10-CM | POA: Diagnosis present

## 2022-05-18 DIAGNOSIS — Z881 Allergy status to other antibiotic agents status: Secondary | ICD-10-CM

## 2022-05-18 DIAGNOSIS — Z87891 Personal history of nicotine dependence: Secondary | ICD-10-CM

## 2022-05-18 DIAGNOSIS — M549 Dorsalgia, unspecified: Secondary | ICD-10-CM | POA: Diagnosis present

## 2022-05-18 DIAGNOSIS — T83512A Infection and inflammatory reaction due to nephrostomy catheter, initial encounter: Secondary | ICD-10-CM | POA: Diagnosis present

## 2022-05-18 DIAGNOSIS — Z9842 Cataract extraction status, left eye: Secondary | ICD-10-CM

## 2022-05-18 DIAGNOSIS — G8929 Other chronic pain: Secondary | ICD-10-CM | POA: Diagnosis present

## 2022-05-18 DIAGNOSIS — G4733 Obstructive sleep apnea (adult) (pediatric): Secondary | ICD-10-CM | POA: Diagnosis present

## 2022-05-18 DIAGNOSIS — Z9049 Acquired absence of other specified parts of digestive tract: Secondary | ICD-10-CM

## 2022-05-18 LAB — COMPREHENSIVE METABOLIC PANEL
ALT: 20 U/L (ref 0–44)
AST: 27 U/L (ref 15–41)
Albumin: 4 g/dL (ref 3.5–5.0)
Alkaline Phosphatase: 147 U/L — ABNORMAL HIGH (ref 38–126)
Anion gap: 12 (ref 5–15)
BUN: 29 mg/dL — ABNORMAL HIGH (ref 8–23)
CO2: 24 mmol/L (ref 22–32)
Calcium: 9.1 mg/dL (ref 8.9–10.3)
Chloride: 102 mmol/L (ref 98–111)
Creatinine, Ser: 2.35 mg/dL — ABNORMAL HIGH (ref 0.61–1.24)
GFR, Estimated: 27 mL/min — ABNORMAL LOW (ref >60)
Glucose, Bld: 104 mg/dL — ABNORMAL HIGH (ref 70–99)
Potassium: 3.4 mmol/L — ABNORMAL LOW (ref 3.5–5.1)
Sodium: 138 mmol/L (ref 135–145)
Total Bilirubin: 0.5 mg/dL (ref 0.3–1.2)
Total Protein: 7.8 g/dL (ref 6.5–8.1)

## 2022-05-18 LAB — RESP PANEL BY RT-PCR (FLU A&B, COVID) ARPGX2
Influenza A by PCR: NEGATIVE
Influenza B by PCR: NEGATIVE
SARS Coronavirus 2 by RT PCR: NEGATIVE

## 2022-05-18 LAB — URINALYSIS, ROUTINE W REFLEX MICROSCOPIC
Bilirubin Urine: NEGATIVE
Glucose, UA: NEGATIVE mg/dL
Ketones, ur: NEGATIVE mg/dL
Nitrite: POSITIVE — AB
Protein, ur: 100 mg/dL — AB
Specific Gravity, Urine: 1.006 (ref 1.005–1.030)
pH: 8 (ref 5.0–8.0)

## 2022-05-18 LAB — CBC WITH DIFFERENTIAL/PLATELET
Abs Immature Granulocytes: 0.05 10*3/uL (ref 0.00–0.07)
Basophils Absolute: 0 10*3/uL (ref 0.0–0.1)
Basophils Relative: 0 %
Eosinophils Absolute: 0 10*3/uL (ref 0.0–0.5)
Eosinophils Relative: 0 %
HCT: 36.3 % — ABNORMAL LOW (ref 39.0–52.0)
Hemoglobin: 13.4 g/dL (ref 13.0–17.0)
Immature Granulocytes: 0 %
Lymphocytes Relative: 5 %
Lymphs Abs: 0.8 10*3/uL (ref 0.7–4.0)
MCH: 35.5 pg — ABNORMAL HIGH (ref 26.0–34.0)
MCHC: 36.9 g/dL — ABNORMAL HIGH (ref 30.0–36.0)
MCV: 96.3 fL (ref 80.0–100.0)
Monocytes Absolute: 0.6 10*3/uL (ref 0.1–1.0)
Monocytes Relative: 4 %
Neutro Abs: 13.8 10*3/uL — ABNORMAL HIGH (ref 1.7–7.7)
Neutrophils Relative %: 91 %
Platelets: 208 10*3/uL (ref 150–400)
RBC: 3.77 MIL/uL — ABNORMAL LOW (ref 4.22–5.81)
RDW: 14.3 % (ref 11.5–15.5)
WBC: 15.3 10*3/uL — ABNORMAL HIGH (ref 4.0–10.5)
nRBC: 0 % (ref 0.0–0.2)

## 2022-05-18 LAB — PROTIME-INR
INR: 1 (ref 0.8–1.2)
Prothrombin Time: 13.1 seconds (ref 11.4–15.2)

## 2022-05-18 LAB — LACTIC ACID, PLASMA: Lactic Acid, Venous: 1.8 mmol/L (ref 0.5–1.9)

## 2022-05-18 LAB — APTT: aPTT: 26 seconds (ref 24–36)

## 2022-05-18 MED ORDER — SODIUM CHLORIDE 0.9 % IV SOLN
2.0000 g | INTRAVENOUS | Status: DC
  Administered 2022-05-19 – 2022-05-21 (×3): 2 g via INTRAVENOUS
  Filled 2022-05-18 (×3): qty 12.5

## 2022-05-18 MED ORDER — SODIUM CHLORIDE 0.9 % IV BOLUS
1000.0000 mL | Freq: Once | INTRAVENOUS | Status: AC
  Administered 2022-05-18: 1000 mL via INTRAVENOUS

## 2022-05-18 MED ORDER — SODIUM CHLORIDE 0.9 % IV SOLN
2.0000 g | Freq: Once | INTRAVENOUS | Status: AC
  Administered 2022-05-18: 2 g via INTRAVENOUS
  Filled 2022-05-18: qty 12.5

## 2022-05-18 NOTE — ED Provider Notes (Signed)
[facility] EMERGENCY DEPARTMENT Provider Note   CSN: [id number] Arrival date & time: 05/18/22  2024     History {Add pertinent medical, surgical, social history, OB history to HPI:1} Chief Complaint  Patient presents with   Fever    Jordan Ward is a 81 y.o. male.  Patient has suprapubic catheter.  Patient complains of fevers and chills.  Temperature was 102 today going to his wife.  He did take 1 Levaquin because he supposed to get his catheter replaced today.  Patient also has a nephrostomy tube on the left because he has had plenty of kidney stones.   Fever      Home Medications Prior to Admission medications   Medication Sig Start Date End Date Taking? Authorizing Provider  acetaminophen (TYLENOL) 325 MG tablet Take 650 mg by mouth every 6 (six) hours as needed.    [provider]  amLODipine (NORVASC) 10 MG tablet Take 10 mg by mouth daily. 05/21/21   [provider]  diazepam (VALIUM) 5 MG tablet Take 5 mg by mouth every 12 (twelve) hours as needed for muscle spasms. RX prescribed by his PCP office Valentino Nose, NP).    [provider]  gabapentin (NEURONTIN) 100 MG capsule Take 100 mg by mouth 3 (three) times daily as needed. 01/10/22   [provider]  GABAPENTIN PO Take by mouth.    [provider]  HYDROcodone-acetaminophen (NORCO) 7.5-325 MG per tablet Take 1 tablet by mouth 2 (two) times daily. Max APAP 3 GM IN 24 HOURS FROM ALL SOURCES Patient taking differently: Take 1 tablet by mouth in the morning and at bedtime. ALL MEDICATIONS TO BE CRUSHED AND PLACED IN APPLESAUCE 08/22/14   Blanchie Serve, MD  levofloxacin (LEVAQUIN) 500 MG tablet Take 1 as directed 04/15/22   Franchot Gallo, MD  levothyroxine (SYNTHROID) 75 MCG tablet Take 75 mcg by mouth daily. 07/02/21   [provider]  OXYGEN Inhale 1 L into the lungs every evening.    [provider]  potassium chloride SA (KLOR-CON) 20 MEQ tablet Take 20  mEq by mouth daily. 07/02/21   [provider]      Allergies    Tetracyclines & related and Ciprofloxacin    Review of Systems   Review of Systems  Constitutional:  Positive for fever.    Physical Exam Updated Vital Signs BP 114/76   Pulse 89   Temp 99.3 F (37.4 C) (Oral)   Resp (!) 21   Ht '5\' 5"'$  (1.651 m)   Wt 52.6 kg   SpO2 95%   BMI 19.30 kg/m  Physical Exam  ED Results / Procedures / Treatments   Labs (all labs ordered are listed, but only abnormal results are displayed) Labs Reviewed  COMPREHENSIVE METABOLIC PANEL - Abnormal; Notable for the following components:      Result Value   Potassium 3.4 (*)    Glucose, Bld 104 (*)    BUN 29 (*)    Creatinine, Ser 2.35 (*)    Alkaline Phosphatase 147 (*)    GFR, Estimated 27 (*)    All other components within normal limits  CBC WITH DIFFERENTIAL/PLATELET - Abnormal; Notable for the following components:   WBC 15.3 (*)    RBC 3.77 (*)    HCT 36.3 (*)    MCH 35.5 (*)    MCHC 36.9 (*)    Neutro Abs 13.8 (*)    All other components within normal limits  URINALYSIS, ROUTINE W REFLEX  MICROSCOPIC - Abnormal; Notable for the following components:   APPearance HAZY (*)    Hgb urine dipstick SMALL (*)    Protein, ur 100 (*)    Nitrite POSITIVE (*)    Leukocytes,Ua MODERATE (*)    Bacteria, UA FEW (*)    All other components within normal limits  CULTURE, BLOOD (ROUTINE X 2)  CULTURE, BLOOD (ROUTINE X 2)  RESP PANEL BY RT-PCR (FLU A&B, COVID) ARPGX2  URINE CULTURE  LACTIC ACID, PLASMA  PROTIME-INR  APTT  LACTIC ACID, PLASMA    EKG None  Radiology DG Chest Port 1 View  Result Date: 05/18/2022 CLINICAL DATA:  Questionable sepsis - evaluate for abnormality EXAM: PORTABLE CHEST 1 VIEW COMPARISON:  Radiograph 09/20/2020 FINDINGS: Patient rotation limits assessment. There is chronic interstitial coarsening with areas of scarring in the lung bases and right mid lung. a there may be small superimposed opacity  in the periphery of the right lower lobe. Probable basilar honeycombing. The heart is normal in size. Aortic atherosclerosis and tortuosity. No pleural effusion or pneumothorax. IMPRESSION: 1. Chronic interstitial coarsening with areas of scarring in the lung bases and right mid lung. 2. Possible small acute opacity in the periphery of the right lower lobe, may represent superimposed pneumonia. Electronically Signed   By: Keith Rake M.D.   On: 05/18/2022 22:04    Procedures Procedures  {Document cardiac monitor, telemetry assessment procedure when appropriate:1}  Medications Ordered in ED Medications  ceFEPIme (MAXIPIME) 2 g in sodium chloride 0.9 % 100 mL IVPB (has no administration in time range)  ceFEPIme (MAXIPIME) 2 g in sodium chloride 0.9 % 100 mL IVPB (0 g Intravenous Stopped 05/18/22 2234)  sodium chloride 0.9 % bolus 1,000 mL (0 mLs Intravenous Stopped 05/18/22 2234)    ED Course/ Medical Decision Making/ A&P                           Medical Decision Making Amount and/or Complexity of Data Reviewed Labs: ordered. Radiology: ordered. ECG/medicine tests: ordered.  Suspect urinary tract infection.  I think he should be admitted for IV antibiotics  {Document critical care time when appropriate:1} {Document review of labs and clinical decision tools ie heart score, Chads2Vasc2 etc:1}  {Document your independent review of radiology images, and any outside records:1} {Document your discussion with family members, caretakers, and with consultants:1} {Document social determinants of health affecting pt's care:1} {Document your decision making why or why not admission, treatments were needed:1} Final Clinical Impression(s) / ED Diagnoses Final diagnoses:  None    Rx / DC Orders ED Discharge Orders     None

## 2022-05-18 NOTE — ED Notes (Signed)
Labs collected prior to abx

## 2022-05-18 NOTE — ED Notes (Signed)
Xr at bedside

## 2022-05-18 NOTE — Sepsis Progress Note (Signed)
Elink following for Sepsis Protocol 

## 2022-05-18 NOTE — Progress Notes (Signed)
Pharmacy Antibiotic Note  Jordan Ward is a 81 y.o. male admitted on 05/18/2022 with sepsis/UTI - chronic foley/nephrostomy.  Pharmacy has been consulted for Cefepime dosing.   Plan: Cefepime 2gm IV q24h Will f/u renal function, micro data, and pt's clinical condition    Height: '5\' 5"'$  (165.1 cm) Weight: 52.6 kg (116 lb) IBW/kg (Calculated) : 61.5  Temp (24hrs), Avg:99.5 F (37.5 C), Min:99.3 F (37.4 C), Max:99.6 F (37.6 C)  Recent Labs  Lab 05/18/22 2145  WBC 15.3*  CREATININE 2.35*  LATICACIDVEN 1.8    Estimated Creatinine Clearance: 19 mL/min (A) (by C-G formula based on SCr of 2.35 mg/dL (H)).    Allergies  Allergen Reactions   Tetracyclines & Related Anaphylaxis and Rash   Ciprofloxacin     Trouble swallowing unknown reaction according to wife     Antimicrobials this admission: 8/20 Cefepime >>   Microbiology results: 8/20 BCx:  8/20 UCx:    Thank you for allowing pharmacy to be a part of this patient's care.  Sherlon Handing, PharmD, BCPS Please see amion for complete clinical pharmacist phone list 05/18/2022 11:41 PM

## 2022-05-18 NOTE — ED Triage Notes (Signed)
Pt with supra foley cath and nephrostomy.  Pt fever started tonight per wife.  RA sats 88-89% in triage.

## 2022-05-19 ENCOUNTER — Ambulatory Visit: Payer: Medicare Other | Admitting: Physician Assistant

## 2022-05-19 DIAGNOSIS — J189 Pneumonia, unspecified organism: Secondary | ICD-10-CM | POA: Diagnosis present

## 2022-05-19 DIAGNOSIS — T83512A Infection and inflammatory reaction due to nephrostomy catheter, initial encounter: Secondary | ICD-10-CM | POA: Diagnosis present

## 2022-05-19 DIAGNOSIS — N390 Urinary tract infection, site not specified: Secondary | ICD-10-CM | POA: Diagnosis present

## 2022-05-19 DIAGNOSIS — I2510 Atherosclerotic heart disease of native coronary artery without angina pectoris: Secondary | ICD-10-CM | POA: Diagnosis present

## 2022-05-19 DIAGNOSIS — A419 Sepsis, unspecified organism: Secondary | ICD-10-CM

## 2022-05-19 DIAGNOSIS — N179 Acute kidney failure, unspecified: Secondary | ICD-10-CM

## 2022-05-19 DIAGNOSIS — R6520 Severe sepsis without septic shock: Secondary | ICD-10-CM | POA: Diagnosis present

## 2022-05-19 DIAGNOSIS — E876 Hypokalemia: Secondary | ICD-10-CM

## 2022-05-19 DIAGNOSIS — T83518A Infection and inflammatory reaction due to other urinary catheter, initial encounter: Secondary | ICD-10-CM | POA: Diagnosis present

## 2022-05-19 DIAGNOSIS — I739 Peripheral vascular disease, unspecified: Secondary | ICD-10-CM | POA: Diagnosis present

## 2022-05-19 DIAGNOSIS — K5909 Other constipation: Secondary | ICD-10-CM | POA: Diagnosis present

## 2022-05-19 DIAGNOSIS — E039 Hypothyroidism, unspecified: Secondary | ICD-10-CM | POA: Diagnosis present

## 2022-05-19 DIAGNOSIS — Z28310 Unvaccinated for covid-19: Secondary | ICD-10-CM | POA: Diagnosis not present

## 2022-05-19 DIAGNOSIS — Z8673 Personal history of transient ischemic attack (TIA), and cerebral infarction without residual deficits: Secondary | ICD-10-CM | POA: Diagnosis not present

## 2022-05-19 DIAGNOSIS — Z8249 Family history of ischemic heart disease and other diseases of the circulatory system: Secondary | ICD-10-CM | POA: Diagnosis not present

## 2022-05-19 DIAGNOSIS — J9601 Acute respiratory failure with hypoxia: Secondary | ICD-10-CM | POA: Diagnosis present

## 2022-05-19 DIAGNOSIS — J8410 Pulmonary fibrosis, unspecified: Secondary | ICD-10-CM | POA: Diagnosis present

## 2022-05-19 DIAGNOSIS — N189 Chronic kidney disease, unspecified: Secondary | ICD-10-CM | POA: Diagnosis not present

## 2022-05-19 DIAGNOSIS — Y846 Urinary catheterization as the cause of abnormal reaction of the patient, or of later complication, without mention of misadventure at the time of the procedure: Secondary | ICD-10-CM | POA: Diagnosis present

## 2022-05-19 DIAGNOSIS — E785 Hyperlipidemia, unspecified: Secondary | ICD-10-CM | POA: Diagnosis present

## 2022-05-19 DIAGNOSIS — N1832 Chronic kidney disease, stage 3b: Secondary | ICD-10-CM | POA: Diagnosis present

## 2022-05-19 DIAGNOSIS — Z9981 Dependence on supplemental oxygen: Secondary | ICD-10-CM | POA: Diagnosis not present

## 2022-05-19 DIAGNOSIS — I129 Hypertensive chronic kidney disease with stage 1 through stage 4 chronic kidney disease, or unspecified chronic kidney disease: Secondary | ICD-10-CM | POA: Diagnosis present

## 2022-05-19 DIAGNOSIS — G35 Multiple sclerosis: Secondary | ICD-10-CM | POA: Diagnosis present

## 2022-05-19 DIAGNOSIS — Z20822 Contact with and (suspected) exposure to covid-19: Secondary | ICD-10-CM | POA: Diagnosis present

## 2022-05-19 DIAGNOSIS — Z823 Family history of stroke: Secondary | ICD-10-CM | POA: Diagnosis not present

## 2022-05-19 LAB — CBC WITH DIFFERENTIAL/PLATELET
Abs Immature Granulocytes: 0.28 10*3/uL — ABNORMAL HIGH (ref 0.00–0.07)
Basophils Absolute: 0.1 10*3/uL (ref 0.0–0.1)
Basophils Relative: 0 %
Eosinophils Absolute: 0.1 10*3/uL (ref 0.0–0.5)
Eosinophils Relative: 0 %
HCT: 35.7 % — ABNORMAL LOW (ref 39.0–52.0)
Hemoglobin: 12.5 g/dL — ABNORMAL LOW (ref 13.0–17.0)
Immature Granulocytes: 1 %
Lymphocytes Relative: 6 %
Lymphs Abs: 1.3 10*3/uL (ref 0.7–4.0)
MCH: 32.7 pg (ref 26.0–34.0)
MCHC: 35 g/dL (ref 30.0–36.0)
MCV: 93.5 fL (ref 80.0–100.0)
Monocytes Absolute: 1.1 10*3/uL — ABNORMAL HIGH (ref 0.1–1.0)
Monocytes Relative: 5 %
Neutro Abs: 20 10*3/uL — ABNORMAL HIGH (ref 1.7–7.7)
Neutrophils Relative %: 88 %
Platelets: 188 10*3/uL (ref 150–400)
RBC: 3.82 MIL/uL — ABNORMAL LOW (ref 4.22–5.81)
RDW: 14.3 % (ref 11.5–15.5)
WBC: 22.9 10*3/uL — ABNORMAL HIGH (ref 4.0–10.5)
nRBC: 0 % (ref 0.0–0.2)

## 2022-05-19 LAB — MAGNESIUM: Magnesium: 2 mg/dL (ref 1.7–2.4)

## 2022-05-19 LAB — COMPREHENSIVE METABOLIC PANEL
ALT: 18 U/L (ref 0–44)
AST: 28 U/L (ref 15–41)
Albumin: 3.4 g/dL — ABNORMAL LOW (ref 3.5–5.0)
Alkaline Phosphatase: 123 U/L (ref 38–126)
Anion gap: 9 (ref 5–15)
BUN: 28 mg/dL — ABNORMAL HIGH (ref 8–23)
CO2: 23 mmol/L (ref 22–32)
Calcium: 8.9 mg/dL (ref 8.9–10.3)
Chloride: 108 mmol/L (ref 98–111)
Creatinine, Ser: 2.26 mg/dL — ABNORMAL HIGH (ref 0.61–1.24)
GFR, Estimated: 29 mL/min — ABNORMAL LOW (ref >60)
Glucose, Bld: 129 mg/dL — ABNORMAL HIGH (ref 70–99)
Potassium: 3.6 mmol/L (ref 3.5–5.1)
Sodium: 140 mmol/L (ref 135–145)
Total Bilirubin: 1 mg/dL (ref 0.3–1.2)
Total Protein: 7.3 g/dL (ref 6.5–8.1)

## 2022-05-19 LAB — PROCALCITONIN: Procalcitonin: 19.87 ng/mL

## 2022-05-19 LAB — TSH: TSH: 2.13 u[IU]/mL (ref 0.350–4.500)

## 2022-05-19 LAB — PROTIME-INR
INR: 1.2 (ref 0.8–1.2)
Prothrombin Time: 15.1 seconds (ref 11.4–15.2)

## 2022-05-19 LAB — CORTISOL-AM, BLOOD: Cortisol - AM: 29.6 ug/dL — ABNORMAL HIGH (ref 6.7–22.6)

## 2022-05-19 LAB — MRSA NEXT GEN BY PCR, NASAL: MRSA by PCR Next Gen: NOT DETECTED

## 2022-05-19 MED ORDER — GABAPENTIN 100 MG PO CAPS
100.0000 mg | ORAL_CAPSULE | Freq: Every day | ORAL | Status: DC
  Administered 2022-05-19 – 2022-05-21 (×3): 100 mg via ORAL
  Filled 2022-05-19 (×3): qty 1

## 2022-05-19 MED ORDER — ACETAMINOPHEN 325 MG PO TABS
650.0000 mg | ORAL_TABLET | Freq: Four times a day (QID) | ORAL | Status: DC | PRN
  Administered 2022-05-20: 650 mg via ORAL
  Filled 2022-05-19: qty 2

## 2022-05-19 MED ORDER — ALBUTEROL SULFATE (2.5 MG/3ML) 0.083% IN NEBU: 2.5000 mg | INHALATION_SOLUTION | RESPIRATORY_TRACT | Status: DC | PRN

## 2022-05-19 MED ORDER — ONDANSETRON HCL 4 MG/2ML IJ SOLN: 4.0000 mg | Freq: Four times a day (QID) | INTRAMUSCULAR | Status: DC | PRN

## 2022-05-19 MED ORDER — DIAZEPAM 5 MG PO TABS
5.0000 mg | ORAL_TABLET | Freq: Two times a day (BID) | ORAL | Status: DC | PRN
Start: 2022-05-19 — End: 2022-05-22
  Administered 2022-05-21: 5 mg via ORAL
  Filled 2022-05-19: qty 1

## 2022-05-19 MED ORDER — POTASSIUM CHLORIDE IN NACL 40-0.9 MEQ/L-% IV SOLN
INTRAVENOUS | Status: DC
  Filled 2022-05-19 (×4): qty 1000

## 2022-05-19 MED ORDER — HEPARIN SODIUM (PORCINE) 5000 UNIT/ML IJ SOLN
5000.0000 [IU] | Freq: Three times a day (TID) | INTRAMUSCULAR | Status: DC
Start: 2022-05-19 — End: 2022-05-22
  Administered 2022-05-19 – 2022-05-22 (×10): 5000 [IU] via SUBCUTANEOUS
  Filled 2022-05-19 (×10): qty 1

## 2022-05-19 MED ORDER — OXYCODONE HCL 5 MG PO TABS
5.0000 mg | ORAL_TABLET | ORAL | Status: DC | PRN
  Administered 2022-05-21: 5 mg via ORAL
  Filled 2022-05-19: qty 1

## 2022-05-19 MED ORDER — ACETAMINOPHEN 650 MG RE SUPP: 650.0000 mg | Freq: Four times a day (QID) | RECTAL | Status: DC | PRN

## 2022-05-19 MED ORDER — AMLODIPINE BESYLATE 5 MG PO TABS
10.0000 mg | ORAL_TABLET | Freq: Every day | ORAL | Status: DC
  Administered 2022-05-19 – 2022-05-22 (×4): 10 mg via ORAL
  Filled 2022-05-19 (×4): qty 2

## 2022-05-19 MED ORDER — ONDANSETRON HCL 4 MG PO TABS: 4.0000 mg | ORAL_TABLET | Freq: Four times a day (QID) | ORAL | Status: DC | PRN

## 2022-05-19 MED ORDER — LEVOTHYROXINE SODIUM 75 MCG PO TABS
75.0000 ug | ORAL_TABLET | Freq: Every day | ORAL | Status: DC
  Administered 2022-05-19 – 2022-05-22 (×4): 75 ug via ORAL
  Filled 2022-05-19 (×3): qty 1
  Filled 2022-05-19: qty 2

## 2022-05-19 MED ORDER — POLYETHYLENE GLYCOL 3350 17 G PO PACK
17.0000 g | PACK | Freq: Every day | ORAL | Status: DC | PRN
Start: 2022-05-19 — End: 2022-05-22
  Administered 2022-05-19: 17 g via ORAL
  Filled 2022-05-19 (×2): qty 1

## 2022-05-19 NOTE — Assessment & Plan Note (Signed)
-   Heart rate 103, respiratory rate 24, leukocytosis 15.3, with AKI, and acute respiratory failure - Negative COVID and flu - Chest x-ray shows pneumonia right lower lung - UA indicative of UTI with suprapubic catheter and nephrostomy tubes - Blood cultures pending - Urine culture pending - Cefepime started, continue - 1 L normal saline given - Continue IV hydration throughout the night - Lactic acid 1.8 - Continue to monitor

## 2022-05-19 NOTE — Assessment & Plan Note (Signed)
Creatinine at baseline 1.78 Creatinine today 2.35 Hold nephrotoxic agents when possible Secondary to sepsis Continue IV hydration Trend in the a.m.

## 2022-05-19 NOTE — ED Provider Notes (Signed)
  Physical Exam  BP 105/67   Pulse 73   Temp 99 F (37.2 C)   Resp 20   Ht '5\' 5"'$  (1.651 m)   Wt 52.6 kg   SpO2 96%   BMI 19.30 kg/m   Physical Exam  Procedures  Procedures  ED Course / MDM    Medical Decision Making Amount and/or Complexity of Data Reviewed Labs: ordered. Radiology: ordered. ECG/medicine tests: ordered.  Risk Decision regarding hospitalization.  Received patient with fever and UTI.  Has nephrostomy also.  Discussed with hospitalist and admitted patient.       Davonna Belling, MD 05/19/22 [id number]

## 2022-05-19 NOTE — H&P (Signed)
History and Physical    Patient: Jordan Ward KVQ:[id number] DOB: [birth year] DOA: 05/18/2022 DOS: the patient was seen and examined on 05/19/2022 PCP: Valentino Nose, FNP  Patient coming from: Home  Chief Complaint:  Chief Complaint  Patient presents with   Fever   HPI: Jordan Ward is a 81 y.o. male with medical history significant of chronic back pain, C. difficile colitis, coronary artery disease, CVA, suprapubic catheter, nephrostomy tube, dyslipidemia, hypothyroidism, neuromuscular disorder, and more presents ED with a chief complaint of chills.  Patient reports that today he has had rigors and chills.  They took his temp at home which was 102 and they brought him into the ED.  They do report he been coughing for some time, productive of sputum.  He had dyspnea starting today before the rigors.  He has not had bladder pain, but he never does with his UTIs.  He has not seen any hematuria, but he also has never seen that with his UTIs.  His suprapubic catheter was last changed 4 weeks ago.  It is due to be changed tomorrow.  Patient reports generalized weakness and weight loss.  Reports he is got a good appetite and eats 3 meals a day.  He has no other complaints at this time.  On review of systems patient complains about constipation which is chronic for him.  Patient does not smoke, does not drink, does not use illicit drugs.  He is not vaccinated for COVID.  He is full code.  Review of Systems: As mentioned in the history of present illness. All other systems reviewed and are negative. Past Medical History:  Diagnosis Date   Anemia    Arthritis    Back pain, chronic    Bilateral carotid bruits    C. difficile colitis 09/2011   CAD (coronary artery disease)    Carotid artery stenosis    Cerebrovascular disease    Cerebrovascular disease 08/17/2018   Colon polyps    Complication of cystostomy catheter, initial encounter ([place]) 04/20/2020   Dyslipidemia    Dysphagia 10/07/2011    Encephalopathy    Gait disorder    HA (headache)    High grade dysplasia in colonic adenoma 09/2005   History of kidney stones    HTN (hypertension), malignant 10/06/2011   Hypernatremia    Hypokalemia    Hypothyroidism 10/08/2011   Insomnia    Junctional rhythm    Kidney stones    MS (multiple sclerosis) (HCC)    Neuromuscular disorder (HCC)    MS   OSA (obstructive sleep apnea)    Paroxysmal atrial tachycardia ([place])    Peripheral vascular disease ([place])    Pneumonia 4 yrs ago   Pulmonary fibrosis ([place]) 10/06/2011   Pulmonary nodule 10/08/2011   PVD (peripheral vascular disease) ([place])    Sacral ulcer (HCC)    Sleep apnea    cannot tolerate   Stroke ([place])    left sided weakness   Suprapubic catheter ([place])    TIA (transient ischemic attack)    Tremors of nervous system 10/08/2011   Urinary tract infection    Ventral hernia without obstruction or gangrene     Large 8X9cm ventral hernia with loss of domain. CT reads report as diastasis recti with herniation or diastasis recti.  Dr. Constance Haw, Surgery, reviewed CT with radiology and there is herniation with only hernia sac or peritoneum over the bowel and large separation of the rectus muslce (i.e. diastasis recti aka loss of domain).  No  surgical intervention recommended given size, age, and health.    Past Surgical History:  Procedure Laterality Date   APPENDECTOMY  09/2005   at time of left hemicolectomy   BACK SURGERY  1976/1979   lower   BILIARY DILATION N/A 03/03/2020   Procedure: BILIARY DILATION;  Surgeon: Rogene Houston, MD;  [facility];  Service: Endoscopy;  Laterality: N/A;   CATARACT EXTRACTION W/PHACO Right 03/08/2018   Procedure: CATARACT EXTRACTION PHACO AND INTRAOCULAR LENS PLACEMENT RIGHT EYE;  Surgeon: Tonny Branch, MD;  Location: [facility];  Service: Ophthalmology;  Laterality: Right;  CDE: 8.86   CATARACT EXTRACTION W/PHACO Left 04/05/2018   Procedure: CATARACT EXTRACTION PHACO AND INTRAOCULAR LENS PLACEMENT (IOC);   Surgeon: Tonny Branch, MD;  Location: [facility];  Service: Ophthalmology;  Laterality: Left;  CDE: 7.36   CENTRAL LINE INSERTION-TUNNELED Right 09/11/2020   Procedure: PLACEMENT OF TUNNELED CENTRAL LINE INTO JUGULAR VEIN;  Surgeon: Virl Cagey, MD;  Location: [facility];  Service: General;  Laterality: Right;   CHOLECYSTECTOMY     Dr. Tamala Julian   COLON SURGERY  09/2005   [place]man: four tubular adenomas, large adenomatous polyp with HIGH GRADE dysplasia   COLONOSCOPY  11/2004   Dr. Sharol Roussel sessile polyp splenic flexure, 53m sessile polyp desc colon, tubulovillous adenoma (bx not removed)   COLONOSCOPY  01/2005   poor prep, polyp could not be found   COLONOSCOPY  05/2005   with EMR, polypectomy Dr. JOlegario Messier bx showed high grade dysplasia, partially resected   COLONOSCOPY  09/2005   Dr. GArsenio Loader [employer] tattooing, four villous colon polyp (3 had been missed on previous colonoscopies due to limitations of procedures   COLONOSCOPY  09/2006   normal TI, no polyps   COLONOSCOPY  10/2007   Dr. RImogene Burndistal mammillations, benign bx, normal TI, random bx neg for microscopic colitis   CYSTOSCOPY W/ URETERAL STENT PLACEMENT Left 09/05/2020   Procedure: CYSTOSCOPY WITH RETROGRADE PYELOGRAM/URETERAL STENT PLACEMENT;  Surgeon: HArdis Hughs MD;  Location: [facility];  Service: Urology;  Laterality: Left;   CYSTOSCOPY WITH LITHOLAPAXY N/A 07/27/2018   Procedure: CYSTOSCOPY WITH LITHOLAPAXY VIA  SUPRAPUBIC TUBE;  Surgeon: DFranchot Gallo MD;  Location: [facility];  Service: Urology;  Laterality: N/A;   CYSTOSCOPY WITH RETROGRADE PYELOGRAM, URETEROSCOPY AND STENT PLACEMENT Left 06/09/2017   Procedure: CYSTOSCOPY WITH LEFT RETROGRADE PYELOGRAM, LEFT URETEROSCOPY, LEFT URETEROSCOPIC STONE EXTRACTION, LEFT URETERAL STENT PLACEMENT;  Surgeon: DFranchot Gallo MD;  Location: [facility];  Service: Urology;  Laterality: Left;   CYSTOSCOPY/URETEROSCOPY/HOLMIUM LASER/STENT PLACEMENT Left 05/22/2020    Procedure: CYSTOSCOPY/URETEROSCOPY/STENT PLACEMENT;  Surgeon: DFranchot Gallo MD;  Location: [facility];  Service: Urology;  Laterality: Left;   ERCP N/A 03/03/2020   Procedure: ENDOSCOPIC RETROGRADE CHOLANGIOPANCREATOGRAPHY (ERCP);  Surgeon: RRogene Houston MD;  [facility];  Service: Endoscopy;  Laterality: N/A;   ERCP N/A 04/20/2020   Procedure: ENDOSCOPIC RETROGRADE CHOLANGIOPANCREATOGRAPHY (ERCP);  Surgeon: RRogene Houston MD;  [facility];  Service: Endoscopy;  Laterality: N/A;  [place] 04[place]   Procedure: STENT REMOVAL;  Surgeon: RRogene Houston MD;  [facility];  Service: Endoscopy;  Laterality: N/A;   INGUINAL HERNIA REPAIR  1971   bilateral   INSERTION OF SUPRAPUBIC CATHETER  06/09/2017   Procedure: EXCHANGE OF SUPRAPUBIC CATHETER;  Surgeon: DFranchot Gallo MD;  Location: [facility];  Service: Urology;;   INSERTION OF SUPRAPUBIC CATHETER  05/22/2020  Procedure: SUPRAPUBIC CATHETER EXCHANGE;  Surgeon: Franchot Gallo, MD;  Location: [facility];  Service: Urology;;   IR NEPHROSTOMY EXCHANGE LEFT  03/25/2021   IR NEPHROSTOMY EXCHANGE LEFT  05/20/2021   IR NEPHROSTOMY EXCHANGE LEFT  07/13/2021   IR NEPHROSTOMY EXCHANGE LEFT  09/09/2021   IR NEPHROSTOMY EXCHANGE LEFT  11/04/2021   IR NEPHROSTOMY EXCHANGE LEFT  12/23/2021   IR NEPHROSTOMY EXCHANGE LEFT  02/17/2022   IR NEPHROSTOMY EXCHANGE LEFT  04/14/2022   IR NEPHROSTOMY PLACEMENT LEFT  05/26/2017   IR NEPHROSTOMY PLACEMENT LEFT  05/02/2020   IR NEPHROSTOMY PLACEMENT LEFT  02/15/2021   IR URETERAL STENT PERC REMOVAL MOD SED  03/25/2021   KIDNEY STONE SURGERY  09/13/2015   LITHOTRIPSY N/A 03/03/2020   Procedure: MECHANICAL LITHOTRIPSY WITH REMOVAL OF MULTIPLE STONE FRAGMENTS;  Surgeon: Rogene Houston, MD;  [facility];  Service: Endoscopy;  Laterality: N/A;   NEPHROLITHOTOMY Left 09/13/2015   Procedure: LEFT PERCUTANEOUS NEPHROLITHOTOMY ;   Surgeon: Franchot Gallo, MD;  [facility];  Service: Urology;  Laterality: Left;   NEPHROSTOMY TUBE REMOVAL  05/22/2020   Procedure: NEPHROSTOMY TUBE REMOVAL;  Surgeon: Franchot Gallo, MD;  Location: [facility];  Service: Urology;;   [facility] REMOVAL Right 09/20/2020   Procedure: MINOR REMOVAL CENTRAL LINE;  Surgeon: Virl Cagey, MD;  Location: [facility];  Service: General;  Laterality: Right;  Pt to arrive at 7:30am for procedure   REMOVAL OF STONES N/A 04/20/2020   Procedure: REMOVAL OF STONES;  Surgeon: Rogene Houston, MD;  [facility];  Service: Endoscopy;  Laterality: N/A;   SPHINCTEROTOMY  03/03/2020   Procedure: BILLARY SPHINCTEROTOMY;  Surgeon: Rogene Houston, MD;  [facility];  Service: Endoscopy;;   SUPRAPUBIC CATHETER INSERTION     Social History:  reports that he quit smoking about 34 years ago. His smoking use included cigarettes. He has a 25.00 pack-year smoking history. He has never used smokeless tobacco. He reports that he does not drink alcohol and does not use drugs.  Allergies  Allergen Reactions   Tetracyclines & Related Anaphylaxis and Rash   Ciprofloxacin     Trouble swallowing unknown reaction according to wife     Family History  Problem Relation Age of Onset   Cirrhosis Brother        etoh   Stroke Mother 45   Coronary artery disease Father 102   Heart attack Brother    Cancer Sister    Multiple sclerosis Other    Colon cancer Neg Hx     Prior to Admission medications   Medication Sig Start Date End Date Taking? Authorizing Provider  acetaminophen (TYLENOL) 325 MG tablet Take 650 mg by mouth every 6 (six) hours as needed.    [provider]  amLODipine (NORVASC) 10 MG tablet Take 10 mg by mouth daily. 05/21/21   [provider]  diazepam (VALIUM) 5 MG tablet Take 5 mg by mouth every 12 (twelve) hours as needed for muscle spasms. RX prescribed by his PCP office Valentino Nose, NP).    [provider]  gabapentin (NEURONTIN) 100 MG capsule Take 100 mg by mouth 3 (three) times daily as needed. 01/10/22   [provider]  GABAPENTIN PO Take by mouth.    [provider]  HYDROcodone-acetaminophen (NORCO) 7.5-325 MG per tablet Take 1 tablet by mouth 2 (two) times daily. Max APAP 3 GM IN 24 HOURS FROM ALL SOURCES Patient taking differently: Take 1 tablet  by mouth in the morning and at bedtime. ALL MEDICATIONS TO BE CRUSHED AND PLACED IN APPLESAUCE 08/22/14   Blanchie Serve, MD  levofloxacin (LEVAQUIN) 500 MG tablet Take 1 as directed 04/15/22   Franchot Gallo, MD  levothyroxine (SYNTHROID) 75 MCG tablet Take 75 mcg by mouth daily. 07/02/21   [provider]  OXYGEN Inhale 1 L into the lungs every evening.    [provider]  potassium chloride SA (KLOR-CON) 20 MEQ tablet Take 20 mEq by mouth daily. 07/02/21   [provider]    Physical Exam: Vitals:   05/18/22 2330 05/19/22 0000 05/19/22 0030 05/19/22 0100  BP: 114/76 98/76 120/80 131/74  Pulse: 89 81 88 82  Resp:  (!) 24 (!) 28 (!) 26  Temp:      TempSrc:      SpO2: 95% 95% 95% 94%  Weight:      Height:       1.  General: Patient lying supine in bed, chronically ill-appearing   2. Psychiatric: Alert and oriented x 3, mood and behavior normal for situation, pleasant and cooperative with exam   3. Neurologic: Speech and language are normal, face is symmetric, moves all 4 extremities voluntarily with generalized weakness, at baseline without acute deficits on limited exam   4. HEENMT:  Head is atraumatic, normocephalic, pupils reactive to light, neck is supple, trachea is midline, mucous membranes are somewhat dry  5. Respiratory : Lungs are clear to auscultation bilaterally without wheezing, rhonchi, rales, no cyanosis, no increase in work of breathing or accessory muscle use   6. Cardiovascular : Heart rate normal, rhythm is regular, murmur present, rubs or gallops, no  peripheral edema, peripheral pulses palpated   7. Gastrointestinal:  Abdomen is soft, nondistended, nontender to palpation bowel sounds active, no masses or organomegaly palpated   8. Skin:  Skin is warm, dry and intact without rashes, acute lesions, or ulcers on limited exam   9.Musculoskeletal:  No acute deformities or trauma, no asymmetry in tone, no peripheral edema, peripheral pulses palpated, no tenderness to palpation in the extremities  Data Reviewed: In the ED Temp 99.3, heart rate 89-103, respiratory rate 17-24, blood pressure 113/68-124/101, satting at 89% on room air, but improving to 99% on 2 L nasal cannula Leukocytosis of 15.3, hemoglobin stable 13.4 Hypokalemia at 3.4 Elevated creatinine 2.35 Negative COVID and flu UA is indicative of UTI Urine culture and blood culture pending Chest x-ray shows possible infiltrate in the right lower lung EKG: Sinus tachycardia, heart rate 100, QTc 439 with baseline artifact Patient started on cefepime given 1 L normal saline  **Reported that the previous ER physician spoke to urology about this patient.  The content of that discussion was not relayed in signout to the current ED provider.  There is also no note from the previous ER provider.  It is unknown what urology said.   Assessment and Plan: * Sepsis ([place]) - Heart rate 103, respiratory rate 24, leukocytosis 15.3, with AKI, and acute respiratory failure - Negative COVID and flu - Chest x-ray shows pneumonia right lower lung - UA indicative of UTI with suprapubic catheter and nephrostomy tubes - Blood cultures pending - Urine culture pending - Cefepime started, continue - 1 L normal saline given - Continue IV hydration throughout the night - Lactic acid 1.8 - Continue to monitor  Hypokalemia - Potassium down to 3.4 - Continue NS +40 mEq of potassium through the night - Trend in the a.m.  Acute respiratory  failure with hypoxia ([place]) - Patient requires 1 L nasal  cannula at night at baseline - Patient is requiring 2 L nasal cannula continuously to maintain oxygen saturations. - Secondary to sepsis and possible pneumonia - Continue cefepime for pneumonia - Continue albuterol as needed - Wean off O2 as tolerated - Monitor on telemetry  Acute kidney injury superimposed on CKD (HCC) Creatinine at baseline 1.78 Creatinine today 2.35 Hold nephrotoxic agents when possible Secondary to sepsis Continue IV hydration Trend in the a.m.   Hypothyroidism - Continue Synthroid - Check TSH   CAP (community acquired pneumonia) - Chest x-ray shows possible infiltrate in the right lower lung - Associated cough and hypoxia - Continue cefepime>> as this antibiotic for coverage of UTI as above - Blood cultures pending - Continue to monitor      Advance Care Planning:   Code Status: Full Code delete  Consults: Urology  Family Communication: Wife at bedside  Severity of Illness: The appropriate patient status for this patient is INPATIENT. Inpatient status is judged to be reasonable and necessary in order to provide the required intensity of service to ensure the patient's safety. The patient's presenting symptoms, physical exam findings, and initial radiographic and laboratory data in the context of their chronic comorbidities is felt to place them at high risk for further clinical deterioration. Furthermore, it is not anticipated that the patient will be medically stable for discharge from the hospital within 2 midnights of admission.   * I certify that at the point of admission it is my clinical judgment that the patient will require inpatient hospital care spanning beyond 2 midnights from the point of admission due to high intensity of service, high risk for further deterioration and high frequency of surveillance required.*  Author: Rolla Plate, DO 05/19/2022 2:57 AM  For on call review [email or web address].

## 2022-05-19 NOTE — Progress Notes (Signed)
Jordan Ward is a 81 y.o. male with medical history significant of chronic back pain, C. difficile colitis, coronary artery disease, CVA, suprapubic catheter, nephrostomy tube, dyslipidemia, hypothyroidism, neuromuscular disorder, and more presents ED with a chief complaint of chills.  Patient was admitted for sepsis, present on admission secondary to right lower lobe pneumonia as well as UTI in the setting of bilateral nephrostomy tubes and suprapubic catheter.  He continues to remain on antibiotics as ordered with IV cefepime and leukocytosis is upward trending.  He is also noted to have some AKI on superimposed CKD.  Urology consulted to assist with suprapubic catheter exchange.  Blood cultures with no growth to date and urine cultures pending.  Patient seen and evaluated at bedside and has been admitted after midnight.  A.m. labs ordered.  Total care time: 25 minutes.

## 2022-05-19 NOTE — Assessment & Plan Note (Signed)
-   Potassium down to 3.4 - Continue NS +40 mEq of potassium through the night - Trend in the a.m.

## 2022-05-19 NOTE — ED Notes (Signed)
Attempted to call report, RN needed a few minutes to review chart and reports she will call me back soon

## 2022-05-19 NOTE — TOC Progression Note (Addendum)
  Transition of Care [facility]) Screening Note   Patient Details  Name: Jordan Ward Date of Birth: 11/17/1940   [facility]) CM/SW Contact:    Boneta Lucks, RN Phone Number: 05/19/2022, 3:24 PM     Transition of Care Department [facility]) has reviewed patient and no TOC needs have been identified at this time. We will continue to monitor patient advancement through interdisciplinary progression rounds. If new patient transition needs arise, please place a TOC consult.    Expected Discharge Plan: [place] Barriers to Discharge: Continued Medical Work up  Expected Discharge Plan and Services Expected Discharge Plan: [place] arrangements for the past 2 months: [place]

## 2022-05-19 NOTE — Assessment & Plan Note (Signed)
-   Patient requires 1 L nasal cannula at night at baseline - Patient is requiring 2 L nasal cannula continuously to maintain oxygen saturations. - Secondary to sepsis and possible pneumonia - Continue cefepime for pneumonia - Continue albuterol as needed - Wean off O2 as tolerated - Monitor on telemetry

## 2022-05-19 NOTE — Assessment & Plan Note (Signed)
Continue Synthroid.  Check TSH. 

## 2022-05-19 NOTE — Assessment & Plan Note (Signed)
-   Chest x-ray shows possible infiltrate in the right lower lung - Associated cough and hypoxia - Continue cefepime>> as this antibiotic for coverage of UTI as above - Blood cultures pending - Continue to monitor

## 2022-05-20 DIAGNOSIS — R6520 Severe sepsis without septic shock: Secondary | ICD-10-CM | POA: Diagnosis not present

## 2022-05-20 DIAGNOSIS — A419 Sepsis, unspecified organism: Secondary | ICD-10-CM | POA: Diagnosis not present

## 2022-05-20 DIAGNOSIS — J9601 Acute respiratory failure with hypoxia: Secondary | ICD-10-CM | POA: Diagnosis not present

## 2022-05-20 LAB — CBC
HCT: 37.1 % — ABNORMAL LOW (ref 39.0–52.0)
Hemoglobin: 12.8 g/dL — ABNORMAL LOW (ref 13.0–17.0)
MCH: 32.1 pg (ref 26.0–34.0)
MCHC: 34.5 g/dL (ref 30.0–36.0)
MCV: 93 fL (ref 80.0–100.0)
Platelets: 175 10*3/uL (ref 150–400)
RBC: 3.99 MIL/uL — ABNORMAL LOW (ref 4.22–5.81)
RDW: 14.7 % (ref 11.5–15.5)
WBC: 22.8 10*3/uL — ABNORMAL HIGH (ref 4.0–10.5)
nRBC: 0 % (ref 0.0–0.2)

## 2022-05-20 LAB — BASIC METABOLIC PANEL
Anion gap: 5 (ref 5–15)
BUN: 30 mg/dL — ABNORMAL HIGH (ref 8–23)
CO2: 22 mmol/L (ref 22–32)
Calcium: 8.5 mg/dL — ABNORMAL LOW (ref 8.9–10.3)
Chloride: 116 mmol/L — ABNORMAL HIGH (ref 98–111)
Creatinine, Ser: 1.92 mg/dL — ABNORMAL HIGH (ref 0.61–1.24)
GFR, Estimated: 35 mL/min — ABNORMAL LOW (ref >60)
Glucose, Bld: 126 mg/dL — ABNORMAL HIGH (ref 70–99)
Potassium: 2.9 mmol/L — ABNORMAL LOW (ref 3.5–5.1)
Sodium: 143 mmol/L (ref 135–145)

## 2022-05-20 LAB — MAGNESIUM: Magnesium: 2.1 mg/dL (ref 1.7–2.4)

## 2022-05-20 LAB — URINE CULTURE

## 2022-05-20 MED ORDER — POTASSIUM CHLORIDE CRYS ER 20 MEQ PO TBCR
40.0000 meq | EXTENDED_RELEASE_TABLET | Freq: Once | ORAL | Status: AC
Start: 2022-05-20 — End: 2022-05-20
  Administered 2022-05-20: 40 meq via ORAL
  Filled 2022-05-20: qty 2

## 2022-05-20 NOTE — TOC Progression Note (Signed)
Transition of Care [facility]) - Progression Note    Patient Details  Name: Jordan Ward MRN: [id number] Date of Birth: 04/13/1941  [facility] of Care [facility]) CM/SW [place], [place] Phone Number: 05/20/2022, 1:01 PM  Clinical Narrative:    [facility] consulted for HH/DME needs. CSW spoke with pt in room to complete assessment. Pt states that he lives with his wife. Pt is independent in completing his ADLs. Pt states that he does not drive but has transportation when needed. Pt states that he has had HH in the past. Pt uses a walker for ambulating in the community. CSW inquired into pts interest in [facility]. Pt would like to f/u on this closer to D/C. TOC to follow.   Expected Discharge Plan: [place] Barriers to Discharge: Continued Medical Work up  Expected Discharge Plan and Services Expected Discharge Plan: [place] In-house Referral: Clinical Social Work Discharge Planning Services: CM Consult Post Acute Care Choice: [place] arrangements for the past 2 months: Single Family Home                                       Social Determinants of Health (SDOH) Interventions    Readmission Risk Interventions    07/15/2021    9:54 AM 02/15/2021   11:47 AM 09/06/2020    1:26 PM  Readmission Risk Prevention Plan  Transportation Screening   Complete  PCP or Specialist Appt within 5-7 Days  Complete   PCP or Specialist Appt within 3-5 Days Complete    Home Care Screening  Complete   Medication Review (RN CM)  Complete   HRI or Home Care Consult Complete  Complete  Social Work Consult for [place] Planning/Counseling Complete  Complete  Palliative Care Screening Complete  Not Applicable  Medication Review [employer]) Complete  Complete

## 2022-05-20 NOTE — Progress Notes (Signed)
Temp 100.6 earlier , gave tylenol.  Also, wife present and concerned about new weakness in pattient's arms.  Assessment showed strong grip strength, no drift, ataxia, numbness or tingling. Contacted Dr. Manuella Ghazi with this information

## 2022-05-20 NOTE — Progress Notes (Signed)
PROGRESS NOTE    Jordan Ward  NTI:[id number] DOB: [birth year] DOA: 05/18/2022 PCP: Valentino Nose, FNP   Brief Narrative:    Jordan Ward is a 81 y.o. male with medical history significant of chronic back pain, C. difficile colitis, coronary artery disease, CVA, suprapubic catheter, nephrostomy tube, dyslipidemia, hypothyroidism, neuromuscular disorder, and more presents ED with a chief complaint of chills.  Patient was admitted for sepsis, present on admission secondary to right lower lobe pneumonia as well as UTI in the setting of bilateral nephrostomy tubes and suprapubic catheter.  He continues to remain on antibiotics as ordered with IV cefepime and leukocytosis is upward trending.  He is also noted to have some AKI on superimposed CKD.  Urology consulted to assist with suprapubic catheter exchange.  Blood cultures with no growth to date and urine cultures with multiple species noted.  Assessment & Plan:   Principal Problem:   Sepsis ([place]) Active Problems:   CAP (community acquired pneumonia)   Hypothyroidism   Acute kidney injury superimposed on CKD ([place])   Acute respiratory failure with hypoxia ([place])   Hypokalemia  Assessment and Plan:  Sepsis present on admission secondary to pneumonia and suspected UTI - Heart rate 103, respiratory rate 24, leukocytosis 15.3, with AKI, and acute respiratory failure - Negative COVID and flu - Chest x-ray shows pneumonia right lower lung - UA indicative of UTI with suprapubic catheter and nephrostomy tubes, suprapubic catheter exchanged - Blood cultures with no growth - Urine culture with multiple species noted - Cefepime started, continue - MRSA negative  Hypokalemia - Continue to replete IV and p.o. and monitor  Acute respiratory failure with hypoxia (HCC) - Patient requires 1 L nasal cannula at night at baseline at home - Patient is requiring 2 L nasal cannula continuously to maintain oxygen saturations. - Secondary to sepsis and  possible pneumonia - Continue cefepime for pneumonia - Continue albuterol as needed - Wean off O2 as tolerated - Monitor on telemetry  Acute kidney injury superimposed on CKD (HCC) Creatinine at baseline 1.78 Creatinine today 1.92 Hold nephrotoxic agents when possible Secondary to sepsis Continue IV hydration Trend in the a.m.   Hypothyroidism - Continue Synthroid - TSH 2.13   CAP (community acquired pneumonia) - Chest x-ray shows possible infiltrate in the right lower lung - Associated cough and hypoxia - Continue cefepime>> as this antibiotic for coverage of UTI as above - Continue to monitor  Debility/deconditioning -PT/OT evaluation pending   DVT prophylaxis: Heparin Code Status: Full Family Communication: None at bedside Disposition Plan:  Status is: Inpatient Remains inpatient appropriate because: Requires ongoing IV medications and fluids.  Consultants:  Urology  Procedures:  Suprapubic catheter exchange 8/21  Antimicrobials:  Anti-infectives (From admission, onward)    Start     Dose/Rate Route Frequency Ordered Stop   05/19/22 2200  ceFEPIme (MAXIPIME) 2 g in sodium chloride 0.9 % 100 mL IVPB        2 g 200 mL/hr over 30 Minutes Intravenous Every 24 hours 05/18/22 2346 05/25/22 2159   05/18/22 2130  ceFEPIme (MAXIPIME) 2 g in sodium chloride 0.9 % 100 mL IVPB        2 g 200 mL/hr over 30 Minutes Intravenous  Once 05/18/22 2126 05/18/22 2234      Subjective: Patient seen and evaluated today with no new acute complaints or concerns. No acute concerns or events noted overnight.  Objective: Vitals:   05/19/22 1432 05/19/22 1540 05/19/22 2000 05/20/22 0500  BP:  128/72 117/87 113/73  Pulse: 76 75 80 81  Resp: '18 18 20 18  '$ Temp:  98.7 F (37.1 C) 100.3 F (37.9 C) 98.4 F (36.9 C)  TempSrc:    Oral  SpO2: 97% 96% 95% 100%  Weight:      Height:        Intake/Output Summary (Last 24 hours) at 05/20/2022 1017 Last data filed at 05/20/2022  0445 Gross per 24 hour  Intake 1584.18 ml  Output 3450 ml  Net -1865.82 ml   Filed Weights   05/18/22 2109  Weight: 52.6 kg    Examination:  General exam: Appears calm and comfortable  Respiratory system: Clear to auscultation. Respiratory effort normal.  Nasal cannula oxygen Cardiovascular system: S1 & S2 heard, RRR.  Gastrointestinal system: Abdomen is soft Central nervous system: Alert and awake Extremities: No edema Skin: No significant lesions noted Psychiatry: Flat affect. Catheters clean dry and intact with clear, yellow urine output noted    Data Reviewed: I have personally reviewed following labs and imaging studies  CBC: Recent Labs  Lab 05/18/22 2145 05/19/22 0648 05/20/22 0435  WBC 15.3* 22.9* 22.8*  NEUTROABS 13.8* 20.0*  --   HGB 13.4 12.5* 12.8*  HCT 36.3* 35.7* 37.1*  MCV 96.3 93.5 93.0  PLT 208 188 259   Basic Metabolic Panel: Recent Labs  Lab 05/18/22 2145 05/19/22 0648 05/20/22 0435  NA 138 140 143  K 3.4* 3.6 2.9*  CL 102 108 116*  CO2 '24 23 22  '$ GLUCOSE 104* 129* 126*  BUN 29* 28* 30*  CREATININE 2.35* 2.26* 1.92*  CALCIUM 9.1 8.9 8.5*  MG  --  2.0 2.1   GFR: Estimated Creatinine Clearance: 23.2 mL/min (A) (by C-G formula based on SCr of 1.92 mg/dL (H)). Liver Function Tests: Recent Labs  Lab 05/18/22 2145 05/19/22 0648  AST 27 28  ALT 20 18  ALKPHOS 147* 123  BILITOT 0.5 1.0  PROT 7.8 7.3  ALBUMIN 4.0 3.4*   No results for input(s): "LIPASE", "AMYLASE" in the last 168 hours. No results for input(s): "AMMONIA" in the last 168 hours. Coagulation Profile: Recent Labs  Lab 05/18/22 2145 05/19/22 0648  INR 1.0 1.2   Cardiac Enzymes: No results for input(s): "CKTOTAL", "CKMB", "CKMBINDEX", "TROPONINI" in the last 168 hours. BNP (last 3 results) No results for input(s): "PROBNP" in the last 8760 hours. HbA1C: No results for input(s): "HGBA1C" in the last 72 hours. CBG: No results for input(s): "GLUCAP" in the last 168  hours. Lipid Profile: No results for input(s): "CHOL", "HDL", "LDLCALC", "TRIG", "CHOLHDL", "LDLDIRECT" in the last 72 hours. Thyroid Function Tests: Recent Labs    05/19/22 0648  TSH 2.130   Anemia Panel: No results for input(s): "VITAMINB12", "FOLATE", "FERRITIN", "TIBC", "IRON", "RETICCTPCT" in the last 72 hours. Sepsis Labs: Recent Labs  Lab 05/18/22 2145 05/19/22 0648  PROCALCITON  --  19.87  LATICACIDVEN 1.8  --     Recent Results (from the past 240 hour(s))  Urine Culture     Status: Abnormal   Collection Time: 05/18/22  9:26 PM   Specimen: In/Out Cath Urine  Result Value Ref Range Status   Specimen Description   Final    IN/OUT CATH URINE Performed at [facility], [address] [place], [place] [address]    Special Requests   Final    NONE Performed at [facility], [address] [place], [place] [address]    Culture MULTIPLE SPECIES PRESENT, SUGGEST RECOLLECTION (A)  Final  Report Status 05/20/2022 FINAL  Final  Culture, blood (Routine x 2)     Status: None (Preliminary result)   Collection Time: 05/18/22  9:40 PM   Specimen: BLOOD RIGHT FOREARM  Result Value Ref Range Status   Specimen Description   Final    BLOOD RIGHT FOREARM BOTTLES DRAWN AEROBIC AND ANAEROBIC   Special Requests Blood Culture adequate volume  Final   Culture   Final    NO GROWTH 2 DAYS Performed at [facility], [address] [place], [place] [address]    Report Status PENDING  Incomplete  Resp Panel by RT-PCR (Flu A&B, Covid) Anterior Nasal Swab     Status: None   Collection Time: 05/18/22  9:43 PM   Specimen: Anterior Nasal Swab  Result Value Ref Range Status   SARS Coronavirus 2 by RT PCR NEGATIVE NEGATIVE Final    Comment: (NOTE) SARS-CoV-2 target nucleic acids are NOT DETECTED.  The SARS-CoV-2 RNA is generally detectable in upper respiratory specimens during the acute phase of infection. The lowest concentration of SARS-CoV-2 viral copies this assay can detect is 138  copies/mL. A negative result does not preclude SARS-Cov-2 infection and should not be used as the sole basis for treatment or other patient management decisions. A negative result may occur with  improper specimen collection/handling, submission of specimen other than nasopharyngeal swab, presence of viral mutation(s) within the areas targeted by this assay, and inadequate number of viral copies(<138 copies/mL). A negative result must be combined with clinical observations, patient history, and epidemiological information. The expected result is Negative.  Fact Sheet for Patients:  EntrepreneurPulse.com.au  Fact Sheet for Healthcare Providers:  IncredibleEmployment.be  This test is no t yet approved or cleared by the [place] FDA and  has been authorized for detection and/or diagnosis of SARS-CoV-2 by FDA under an Emergency Use Authorization (EUA). This EUA will remain  in effect (meaning this test can be used) for the duration of the COVID-19 declaration under Section 564(b)(1) of the Act, 21 U.S.C.section 360bbb-3(b)(1), unless the authorization is terminated  or revoked sooner.       Influenza A by PCR NEGATIVE NEGATIVE Final   Influenza B by PCR NEGATIVE NEGATIVE Final    Comment: (NOTE) The Xpert Xpress SARS-CoV-2/FLU/RSV plus assay is intended as an aid in the diagnosis of influenza from Nasopharyngeal swab specimens and should not be used as a sole basis for treatment. Nasal washings and aspirates are unacceptable for Xpert Xpress SARS-CoV-2/FLU/RSV testing.  Fact Sheet for Patients: EntrepreneurPulse.com.au  Fact Sheet for Healthcare Providers: IncredibleEmployment.be  This test is not yet approved or cleared by the [place] FDA and has been authorized for detection and/or diagnosis of SARS-CoV-2 by FDA under an Emergency Use Authorization (EUA). This EUA will remain in effect (meaning  this test can be used) for the duration of the COVID-19 declaration under Section 564(b)(1) of the Act, 21 U.S.C. section 360bbb-3(b)(1), unless the authorization is terminated or revoked.  Performed at [facility], [address] [place], [place] [address]   Culture, blood (Routine x 2)     Status: None (Preliminary result)   Collection Time: 05/18/22  9:46 PM   Specimen: BLOOD LEFT HAND  Result Value Ref Range Status   Specimen Description   Final    BLOOD LEFT HAND BOTTLES DRAWN AEROBIC AND ANAEROBIC   Special Requests Blood Culture adequate volume  Final   Culture   Final    NO GROWTH 2 DAYS Performed at [facility], [address], [place], [place] [address]    Report Status PENDING  Incomplete  MRSA Next Gen by PCR, Nasal     Status: None   Collection Time: 05/19/22  4:45 PM   Specimen: Nasal Mucosa; Nasal Swab  Result Value Ref Range Status   MRSA by PCR Next Gen NOT DETECTED NOT DETECTED Final    Comment: (NOTE) The GeneXpert MRSA Assay (FDA approved for NASAL specimens only), is one component of a comprehensive MRSA colonization surveillance program. It is not intended to diagnose MRSA infection nor to guide or monitor treatment for MRSA infections. Test performance is not FDA approved in patients less than 37 years old. Performed at [facility], [address], [place], [place] [address]          Radiology Studies: [facility] 1 View  Result Date: 05/18/2022 CLINICAL DATA:  Questionable sepsis - evaluate for abnormality EXAM: PORTABLE CHEST 1 VIEW COMPARISON:  Radiograph 09/20/2020 FINDINGS: Patient rotation limits assessment. There is chronic interstitial coarsening with areas of scarring in the lung bases and right mid lung. a there may be small superimposed opacity in the periphery of the right lower lobe. Probable basilar honeycombing. The heart is normal in size. Aortic atherosclerosis and tortuosity. No pleural effusion or pneumothorax. IMPRESSION: 1.  Chronic interstitial coarsening with areas of scarring in the lung bases and right mid lung. 2. Possible small acute opacity in the periphery of the right lower lobe, may represent superimposed pneumonia. Electronically Signed   By: Keith Rake M.D.   On: 05/18/2022 22:04        Scheduled Meds:  amLODipine  10 mg Oral Daily   gabapentin  100 mg Oral QHS   heparin  5,000 Units Subcutaneous Q8H   levothyroxine  75 mcg Oral Daily   Continuous Infusions:  0.9 % NaCl with KCl 40 mEq / L 75 mL/hr at 05/20/22 0518   ceFEPime (MAXIPIME) IV Stopped (05/19/22 2133)     LOS: 1 day    Time spent: 35 minutes    Medina Degraffenreid Darleen Crocker, DO [facility]  If 7PM-7AM, please contact night-coverage [email or web address] 05/20/2022, 10:17 AM

## 2022-05-21 DIAGNOSIS — J9601 Acute respiratory failure with hypoxia: Secondary | ICD-10-CM | POA: Diagnosis not present

## 2022-05-21 DIAGNOSIS — A419 Sepsis, unspecified organism: Secondary | ICD-10-CM | POA: Diagnosis not present

## 2022-05-21 DIAGNOSIS — R6520 Severe sepsis without septic shock: Secondary | ICD-10-CM | POA: Diagnosis not present

## 2022-05-21 LAB — BASIC METABOLIC PANEL
Anion gap: 3 — ABNORMAL LOW (ref 5–15)
BUN: 23 mg/dL (ref 8–23)
CO2: 20 mmol/L — ABNORMAL LOW (ref 22–32)
Calcium: 8.3 mg/dL — ABNORMAL LOW (ref 8.9–10.3)
Chloride: 120 mmol/L — ABNORMAL HIGH (ref 98–111)
Creatinine, Ser: 1.58 mg/dL — ABNORMAL HIGH (ref 0.61–1.24)
GFR, Estimated: 44 mL/min — ABNORMAL LOW (ref >60)
Glucose, Bld: 127 mg/dL — ABNORMAL HIGH (ref 70–99)
Potassium: 3.8 mmol/L (ref 3.5–5.1)
Sodium: 143 mmol/L (ref 135–145)

## 2022-05-21 LAB — MAGNESIUM: Magnesium: 2.1 mg/dL (ref 1.7–2.4)

## 2022-05-21 LAB — CBC
HCT: 38.1 % — ABNORMAL LOW (ref 39.0–52.0)
Hemoglobin: 12.7 g/dL — ABNORMAL LOW (ref 13.0–17.0)
MCH: 31 pg (ref 26.0–34.0)
MCHC: 33.3 g/dL (ref 30.0–36.0)
MCV: 92.9 fL (ref 80.0–100.0)
Platelets: 173 10*3/uL (ref 150–400)
RBC: 4.1 MIL/uL — ABNORMAL LOW (ref 4.22–5.81)
RDW: 14.9 % (ref 11.5–15.5)
WBC: 24.7 10*3/uL — ABNORMAL HIGH (ref 4.0–10.5)
nRBC: 0 % (ref 0.0–0.2)

## 2022-05-21 LAB — PROCALCITONIN: Procalcitonin: 3.11 ng/mL

## 2022-05-21 NOTE — Progress Notes (Signed)
PROGRESS NOTE    Jordan Ward  INO:[id number] DOB: Sep 10, 1941 DOA: 05/18/2022 PCP: Valentino Nose, FNP   Brief Narrative:    Jordan Ward is a 81 y.o. male with medical history significant of chronic back pain, C. difficile colitis, coronary artery disease, CVA, suprapubic catheter, nephrostomy tube, dyslipidemia, hypothyroidism, neuromuscular disorder, and more presents ED with a chief complaint of chills.  Patient was admitted for sepsis, present on admission secondary to right lower lobe pneumonia as well as UTI in the setting of bilateral nephrostomy tubes and suprapubic catheter.  He continues to remain on antibiotics as ordered with IV cefepime and leukocytosis is upward trending.  He is also noted to have some AKI on superimposed CKD.  Urology consulted to assist with suprapubic catheter exchange.  Blood cultures with no growth to date and urine cultures with multiple species noted.  Assessment & Plan:   Principal Problem:   Sepsis ([place]) Active Problems:   CAP (community acquired pneumonia)   Hypothyroidism   Acute kidney injury superimposed on CKD ([place])   Acute respiratory failure with hypoxia ([place])   Hypokalemia  Assessment and Plan:  Sepsis present on admission secondary to pneumonia and suspected UTI - Heart rate 103, respiratory rate 24, leukocytosis 15.3, with AKI, and acute respiratory failure - Negative COVID and flu - Chest x-ray shows pneumonia right lower lung - UA indicative of UTI with suprapubic catheter and nephrostomy tubes, suprapubic catheter exchanged - Blood cultures with no growth - Urine culture with multiple species noted - Cefepime started, continue - MRSA negative   Acute respiratory failure with hypoxia ([place]) - Patient requires 1 L nasal cannula at night at baseline at home - Patient is requiring 2 L nasal cannula continuously to maintain oxygen saturations. - Secondary to sepsis and possible pneumonia - Continue cefepime for pneumonia - Continue  albuterol as needed - Wean off O2 as tolerated - Monitor on telemetry   Acute kidney injury superimposed on CKD (HCC) Creatinine at baseline 1.78 Creatinine today 1.58 Hold nephrotoxic agents when possible Secondary to sepsis Hold further IV fluid Trend in the a.m.     Hypothyroidism - Continue Synthroid - TSH 2.13     CAP (community acquired pneumonia) - Chest x-ray shows possible infiltrate in the right lower lung - Associated cough and hypoxia - Continue cefepime>> as this antibiotic for coverage of UTI as above - Continue to monitor   Debility/deconditioning -PT/OT evaluation pending with potential need for SNF     DVT prophylaxis: Heparin Code Status: Full Family Communication: None at bedside Disposition Plan:  Status is: Inpatient Remains inpatient appropriate because: Requires ongoing IV medications and fluids.   Consultants:  Urology   Procedures:  Suprapubic catheter exchange 8/21   Antimicrobials:  Anti-infectives (From admission, onward)    Start     Dose/Rate Route Frequency Ordered Stop   05/19/22 2200  ceFEPIme (MAXIPIME) 2 g in sodium chloride 0.9 % 100 mL IVPB        2 g 200 mL/hr over 30 Minutes Intravenous Every 24 hours 05/18/22 2346 05/25/22 2159   05/18/22 2130  ceFEPIme (MAXIPIME) 2 g in sodium chloride 0.9 % 100 mL IVPB        2 g 200 mL/hr over 30 Minutes Intravenous  Once 05/18/22 2126 05/18/22 2234       Subjective: Patient seen and evaluated today with some mild constipation yesterday for which he received some MiraLAX and now he has been having some ongoing bowel movements overnight.  He denies any abdominal pain.  He continues to remain quite weak.  Objective: Vitals:   05/20/22 1440 05/20/22 1850 05/20/22 2100 05/21/22 0500  BP:   132/69 122/79  Pulse:   87 89  Resp:   16 17  Temp: (!) 100.6 F (38.1 C) 98.7 F (37.1 C) 99 F (37.2 C) 98.8 F (37.1 C)  TempSrc: Oral   Oral  SpO2:   99% 95%  Weight:      Height:         Intake/Output Summary (Last 24 hours) at 05/21/2022 0956 Last data filed at 05/21/2022 6294 Gross per 24 hour  Intake 1614.15 ml  Output 4200 ml  Net -2585.85 ml   Filed Weights   05/18/22 2109  Weight: 52.6 kg    Examination:  General exam: Appears calm and comfortable  Respiratory system: Clear to auscultation. Respiratory effort normal.  Currently on room air Cardiovascular system: S1 & S2 heard, RRR.  Gastrointestinal system: Abdomen is soft Central nervous system: Alert and awake Extremities: No edema Skin: No significant lesions noted Psychiatry: Flat affect. Catheter is with clear, yellow urine output    Data Reviewed: I have personally reviewed following labs and imaging studies  CBC: Recent Labs  Lab 05/18/22 2145 05/19/22 0648 05/20/22 0435 05/21/22 0519  WBC 15.3* 22.9* 22.8* 24.7*  NEUTROABS 13.8* 20.0*  --   --   HGB 13.4 12.5* 12.8* 12.7*  HCT 36.3* 35.7* 37.1* 38.1*  MCV 96.3 93.5 93.0 92.9  PLT 208 188 175 765   Basic Metabolic Panel: Recent Labs  Lab 05/18/22 2145 05/19/22 0648 05/20/22 0435 05/21/22 0519  NA 138 140 143 143  K 3.4* 3.6 2.9* 3.8  CL 102 108 116* 120*  CO2 '24 23 22 '$ 20*  GLUCOSE 104* 129* 126* 127*  BUN 29* 28* 30* 23  CREATININE 2.35* 2.26* 1.92* 1.58*  CALCIUM 9.1 8.9 8.5* 8.3*  MG  --  2.0 2.1 2.1   GFR: Estimated Creatinine Clearance: 28.2 mL/min (A) (by C-G formula based on SCr of 1.58 mg/dL (H)). Liver Function Tests: Recent Labs  Lab 05/18/22 2145 05/19/22 0648  AST 27 28  ALT 20 18  ALKPHOS 147* 123  BILITOT 0.5 1.0  PROT 7.8 7.3  ALBUMIN 4.0 3.4*   No results for input(s): "LIPASE", "AMYLASE" in the last 168 hours. No results for input(s): "AMMONIA" in the last 168 hours. Coagulation Profile: Recent Labs  Lab 05/18/22 2145 05/19/22 0648  INR 1.0 1.2   Cardiac Enzymes: No results for input(s): "CKTOTAL", "CKMB", "CKMBINDEX", "TROPONINI" in the last 168 hours. BNP (last 3 results) No  results for input(s): "PROBNP" in the last 8760 hours. HbA1C: No results for input(s): "HGBA1C" in the last 72 hours. CBG: No results for input(s): "GLUCAP" in the last 168 hours. Lipid Profile: No results for input(s): "CHOL", "HDL", "LDLCALC", "TRIG", "CHOLHDL", "LDLDIRECT" in the last 72 hours. Thyroid Function Tests: Recent Labs    05/19/22 0648  TSH 2.130   Anemia Panel: No results for input(s): "VITAMINB12", "FOLATE", "FERRITIN", "TIBC", "IRON", "RETICCTPCT" in the last 72 hours. Sepsis Labs: Recent Labs  Lab 05/18/22 2145 05/19/22 0648 05/21/22 0519  PROCALCITON  --  19.87 3.11  LATICACIDVEN 1.8  --   --     Recent Results (from the past 240 hour(s))  Urine Culture     Status: Abnormal   Collection Time: 05/18/22  9:26 PM   Specimen: In/Out Cath Urine  Result Value Ref Range Status   Specimen Description  Final    IN/OUT CATH URINE Performed at [facility], [address] [place], [place] [address]    Special Requests   Final    NONE Performed at [facility], [address], [place], [place] [address]    [place], SUGGEST RECOLLECTION (A)  Final   Report Status 05/20/2022 FINAL  Final  Culture, blood (Routine x 2)     Status: None (Preliminary result)   Collection Time: 05/18/22  9:40 PM   Specimen: BLOOD RIGHT FOREARM  Result Value Ref Range Status   Specimen Description   Final    BLOOD RIGHT FOREARM BOTTLES DRAWN AEROBIC AND ANAEROBIC   Special Requests Blood Culture adequate volume  Final   Culture   Final    NO GROWTH 3 DAYS Performed at [facility], [address], [place], [place] [address]    Report Status PENDING  Incomplete  Resp Panel by RT-PCR (Flu A&B, Covid) Anterior Nasal Swab     Status: None   Collection Time: 05/18/22  9:43 PM   Specimen: Anterior Nasal Swab  Result Value Ref Range Status   SARS Coronavirus 2 by RT PCR NEGATIVE NEGATIVE Final    Comment: (NOTE) SARS-CoV-2 target nucleic acids are NOT  DETECTED.  The SARS-CoV-2 RNA is generally detectable in upper respiratory specimens during the acute phase of infection. The lowest concentration of SARS-CoV-2 viral copies this assay can detect is 138 copies/mL. A negative result does not preclude SARS-Cov-2 infection and should not be used as the sole basis for treatment or other patient management decisions. A negative result may occur with  improper specimen collection/handling, submission of specimen other than nasopharyngeal swab, presence of viral mutation(s) within the areas targeted by this assay, and inadequate number of viral copies(<138 copies/mL). A negative result must be combined with clinical observations, patient history, and epidemiological information. The expected result is Negative.  Fact Sheet for Patients:  EntrepreneurPulse.com.au  Fact Sheet for Healthcare Providers:  IncredibleEmployment.be  This test is no t yet approved or cleared by the [place] FDA and  has been authorized for detection and/or diagnosis of SARS-CoV-2 by FDA under an Emergency Use Authorization (EUA). This EUA will remain  in effect (meaning this test can be used) for the duration of the COVID-19 declaration under Section 564(b)(1) of the Act, 21 U.S.C.section 360bbb-3(b)(1), unless the authorization is terminated  or revoked sooner.       Influenza A by PCR NEGATIVE NEGATIVE Final   Influenza B by PCR NEGATIVE NEGATIVE Final    Comment: (NOTE) The Xpert Xpress SARS-CoV-2/FLU/RSV plus assay is intended as an aid in the diagnosis of influenza from Nasopharyngeal swab specimens and should not be used as a sole basis for treatment. Nasal washings and aspirates are unacceptable for Xpert Xpress SARS-CoV-2/FLU/RSV testing.  Fact Sheet for Patients: EntrepreneurPulse.com.au  Fact Sheet for Healthcare Providers: IncredibleEmployment.be  This test is not yet  approved or cleared by the [place] FDA and has been authorized for detection and/or diagnosis of SARS-CoV-2 by FDA under an Emergency Use Authorization (EUA). This EUA will remain in effect (meaning this test can be used) for the duration of the COVID-19 declaration under Section 564(b)(1) of the Act, 21 U.S.C. section 360bbb-3(b)(1), unless the authorization is terminated or revoked.  Performed at [facility], [address], [place], [place] [address]   Culture, blood (Routine x 2)     Status: None (Preliminary result)   Collection Time: 05/18/22  9:46 PM  Specimen: BLOOD LEFT HAND  Result Value Ref Range Status   Specimen Description   Final    BLOOD LEFT HAND BOTTLES DRAWN AEROBIC AND ANAEROBIC   Special Requests Blood Culture adequate volume  Final   Culture   Final    NO GROWTH 3 DAYS Performed at [facility], [address], [place], [place] [address]    Report Status PENDING  Incomplete  MRSA Next Gen by PCR, Nasal     Status: None   Collection Time: 05/19/22  4:45 PM   Specimen: Nasal Mucosa; Nasal Swab  Result Value Ref Range Status   MRSA by PCR Next Gen NOT DETECTED NOT DETECTED Final    Comment: (NOTE) The GeneXpert MRSA Assay (FDA approved for NASAL specimens only), is one component of a comprehensive MRSA colonization surveillance program. It is not intended to diagnose MRSA infection nor to guide or monitor treatment for MRSA infections. Test performance is not FDA approved in patients less than 55 years old. Performed at [facility], [address], [place], [place] [address]          Radiology Studies: No results found.      Scheduled Meds:  amLODipine  10 mg Oral Daily   gabapentin  100 mg Oral QHS   heparin  5,000 Units Subcutaneous Q8H   levothyroxine  75 mcg Oral Daily   Continuous Infusions:  ceFEPime (MAXIPIME) IV Stopped (05/20/22 2207)     LOS: 2 days    Time spent: 35 minutes    Dearl Rudden Darleen Crocker, DO [facility]  If 7PM-7AM, please contact night-coverage [email or web address] 05/21/2022, 9:56 AM

## 2022-05-21 NOTE — Evaluation (Signed)
Physical Therapy Evaluation Patient Details Name: Jordan Ward MRN: [id number] DOB: Dec 30, 1940 Today's Date: 05/21/2022  History of Present Illness  Jordan Ward is a 81 y.o. male with medical history significant of chronic back pain, C. difficile colitis, coronary artery disease, CVA, suprapubic catheter, nephrostomy tube, dyslipidemia, hypothyroidism, neuromuscular disorder, and more presents ED with a chief complaint of chills.  Patient reports that today he has had rigors and chills.  They took his temp at home which was 102 and they brought him into the ED.  They do report he been coughing for some time, productive of sputum.  He had dyspnea starting today before the rigors.  He has not had bladder pain, but he never does with his UTIs.  He has not seen any hematuria, but he also has never seen that with his UTIs.  His suprapubic catheter was last changed 4 weeks ago.  It is due to be changed tomorrow.  Patient reports generalized weakness and weight loss.  Reports he is got a good appetite and eats 3 meals a day.  He has no other complaints at this time.   Clinical Impression  Patient demonstrates slow labored cadence without loss of balance, limited mostly due to having episodes of  diarrhea and c/o hot flashes.  Patient demonstrates fair/good return for transferring to/from commode in bathroom and tolerated sitting up in chair after therapy with spouse present in room.  Patient will benefit from continued skilled physical therapy in hospital and recommended venue below to increase strength, balance, endurance for safe ADLs and gait.      Recommendations for follow up therapy are one component of a multi-disciplinary discharge planning process, led by the attending physician.  Recommendations may be updated based on patient status, additional functional criteria and insurance authorization.  Follow Up Recommendations Home health PT      Assistance Recommended at Discharge Set up  Supervision/Assistance  Patient can return home with the following  A little help with walking and/or transfers;A little help with bathing/dressing/bathroom;Assistance with cooking/housework;Help with stairs or ramp for entrance    Equipment Recommendations None recommended by PT  Recommendations for Other Services       Functional Status Assessment Patient has had a recent decline in their functional status and demonstrates the ability to make significant improvements in function in a reasonable and predictable amount of time.     Precautions / Restrictions Precautions Precautions: Fall Restrictions Weight Bearing Restrictions: No      Mobility  Bed Mobility Overal bed mobility: Needs Assistance Bed Mobility: Supine to Sit     Supine to sit: Min assist     General bed mobility comments: as per OT notes.    Transfers Overall transfer level: Needs assistance Equipment used: Rolling walker (2 wheels) Transfers: Sit to/from Stand, Bed to chair/wheelchair/BSC Sit to Stand: Min guard   Step pivot transfers: Min guard       General transfer comment: demonstrates good return for transferring to/from chair and commode in bathroom using RW    Ambulation/Gait Ambulation/Gait assistance: Min guard Gait Distance (Feet): 65 Feet Assistive device: Rolling walker (2 wheels) Gait Pattern/deviations: Decreased step length - right, Decreased step length - left, Decreased stride length Gait velocity: decreased     General Gait Details: slow slightly labored cadence without loss of balance, limited mostly due to having episodes of diarrhea and c/o "hot flashes"  Science writer  Modified Rankin (Stroke Patients Only)       Balance Overall balance assessment: Needs assistance Sitting-balance support: Feet supported, No upper extremity supported Sitting balance-Leahy Scale: Good Sitting balance - Comments: seated EOB   Standing balance  support: Bilateral upper extremity supported, Reliant on assistive device for balance, During functional activity Standing balance-Leahy Scale: Fair Standing balance comment: fair/good using RW                             Pertinent Vitals/Pain Pain Assessment Pain Assessment: Faces Faces Pain Scale: Hurts even more Pain Location: gluteal region Pain Descriptors / Indicators: Sore Pain Intervention(s): Limited activity within patient's tolerance, Monitored during session, Repositioned    Home Living Family/patient expects to be discharged to:: Private residence Living Arrangements: Spouse/significant other Available Help at Discharge: Family;Available PRN/intermittently Type of Home: House Home Access: Stairs to enter Entrance Stairs-Rails: Right Entrance Stairs-Number of Steps: 2   Home Layout: One level Home Equipment: Conservation officer, nature (2 wheels);Cane - single point;BSC/3in1      Prior Function Prior Level of Function : Needs assist       Physical Assist : ADLs (physical);Mobility (physical) Mobility (physical): Bed mobility;Transfers;Gait;Stairs ADLs (physical): IADLs Mobility Comments: [place] ambulator with RW ADLs Comments: Independent ADL; family assists with IADL's     Hand Dominance   Dominant Hand: Right    Extremity/Trunk Assessment   Upper Extremity Assessment Upper Extremity Assessment: Defer to OT evaluation    Lower Extremity Assessment Lower Extremity Assessment: Generalized weakness    Cervical / Trunk Assessment Cervical / Trunk Assessment: Kyphotic  Communication   Communication: No difficulties  Cognition Arousal/Alertness: Awake/alert Behavior During Therapy: WFL for tasks assessed/performed Overall Cognitive Status: Within Functional Limits for tasks assessed                                          General Comments      Exercises     Assessment/Plan    PT Assessment Patient needs continued PT  services  PT Problem List Decreased strength;Decreased activity tolerance;Decreased balance;Decreased mobility       PT Treatment Interventions DME instruction;Gait training;Stair training;Functional mobility training;Therapeutic activities;Therapeutic exercise;Patient/family education;Balance training    PT Goals (Current goals can be found in the Care Plan section)  Acute Rehab PT Goals Patient Stated Goal: return home with family to assist PT Goal Formulation: With patient/family Time For Goal Achievement: 05/28/22 Potential to Achieve Goals: Good    Frequency Min 3X/week     Co-evaluation PT/OT/SLP Co-Evaluation/Treatment: Yes Reason for Co-Treatment: To address functional/ADL transfers PT goals addressed during session: Mobility/safety with mobility;Balance;Proper use of DME OT goals addressed during session: ADL's and self-care       AM-PAC PT "6 Clicks" Mobility  Outcome Measure Help needed turning from your back to your side while in a flat bed without using bedrails?: A Little Help needed moving from lying on your back to sitting on the side of a flat bed without using bedrails?: A Little Help needed moving to and from a bed to a chair (including a wheelchair)?: A Little Help needed standing up from a chair using your arms (e.g., wheelchair or bedside chair)?: A Little Help needed to walk in hospital room?: A Little Help needed climbing 3-5 steps with a railing? : A Lot 6 Click Score: 17    End of  Session   Activity Tolerance: Patient tolerated treatment well;Patient limited by fatigue Patient left: in chair;with call bell/phone within reach;with family/visitor present Nurse Communication: Mobility status PT Visit Diagnosis: Unsteadiness on feet (R26.81);Other abnormalities of gait and mobility (R26.89);Muscle weakness (generalized) (M62.81)    Time: 8614-8307 PT Time Calculation (min) (ACUTE ONLY): 18 min   Charges:   PT Evaluation $PT Eval Low Complexity: 1  Low PT Treatments $Therapeutic Activity: 8-22 mins        {2:29 PM, 05/21/22 Lonell Grandchild, MPT Physical Therapist with [facility] [phone] office [phone] mobile phone

## 2022-05-21 NOTE — Plan of Care (Signed)
  Problem: Acute Rehab PT Goals(only PT should resolve) Goal: Pt Will Go Supine/Side To Sit Outcome: Progressing Flowsheets (Taken 05/21/2022 1430) Pt will go Supine/Side to Sit:  with supervision  with modified independence Goal: Patient Will Perform Sitting Balance Outcome: Progressing Flowsheets (Taken 05/21/2022 1430) Patient will perform sitting balance:  with modified independence  Independently Goal: Patient Will Transfer Sit To/From Stand Outcome: Progressing Flowsheets (Taken 05/21/2022 1430) Patient will transfer sit to/from stand:  with supervision  with modified independence Goal: Pt Will Transfer Bed To Chair/Chair To Bed Outcome: Progressing Flowsheets (Taken 05/21/2022 1430) Pt will Transfer Bed to Chair/Chair to Bed:  with modified independence  with supervision Goal: Pt Will Ambulate Outcome: Progressing Flowsheets (Taken 05/21/2022 1430) Pt will Ambulate:  100 feet  with supervision  with rolling walker   2:31 PM, 05/21/22 Lonell Grandchild, MPT Physical Therapist with [facility] [phone] office [phone] mobile phone

## 2022-05-21 NOTE — Evaluation (Signed)
Occupational Therapy Evaluation Patient Details Name: Jordan Ward MRN: [id number] DOB: 1941-05-16 Today's Date: 05/21/2022   History of Present Illness Jordan Ward is a 81 y.o. male with medical history significant of chronic back pain, C. difficile colitis, coronary artery disease, CVA, suprapubic catheter, nephrostomy tube, dyslipidemia, hypothyroidism, neuromuscular disorder, and more presents ED with a chief complaint of chills.  Patient reports that today he has had rigors and chills.  They took his temp at home which was 102 and they brought him into the ED.  They do report he been coughing for some time, productive of sputum.  He had dyspnea starting today before the rigors.  He has not had bladder pain, but he never does with his UTIs.  He has not seen any hematuria, but he also has never seen that with his UTIs.  His suprapubic catheter was last changed 4 weeks ago.  It is due to be changed tomorrow.  Patient reports generalized weakness and weight loss.  Reports he is got a good appetite and eats 3 meals a day.  He has no other complaints at this time. (per DO)   Clinical Impression   Pt agreeable to OT and PT co-evaluation. Pt presents with need for min A to pull to sit form supine in bed with good sitting balance. Pt able to transfer to chair with RW and Min G assist with fair balance. Pt is generally weak with trouble controlling bowel movements today. Pt was assisted for per-care while standing but likely could do this with more independence but pt reports much pain in this region due to frequent peri-care. Pt would benefit from home health OT home safety analysis to take a look at pt's bathing set up. Pt reports currently standing at sink for sponge baths. Pt was left in the chair with family present and call bell within reach. Pt will benefit from continued OT in the hospital and recommended venue below to increase strength, balance, and endurance for safe ADL's.          Recommendations for follow up therapy are one component of a multi-disciplinary discharge planning process, led by the attending physician.  Recommendations may be updated based on patient status, additional functional criteria and insurance authorization.   Follow Up Recommendations  Home health OT    Assistance Recommended at Discharge Intermittent Supervision/Assistance  Patient can return home with the following A little help with walking and/or transfers;A little help with bathing/dressing/bathroom;Assistance with cooking/housework;Assist for transportation;Help with stairs or ramp for entrance    Functional Status Assessment  Patient has had a recent decline in their functional status and demonstrates the ability to make significant improvements in function in a reasonable and predictable amount of time.  Equipment Recommendations  None recommended by OT (Beneficial for Home Health OT to evaluate pt's bathing set up to identify if a shower chair would be beneficial.)    Recommendations for Other Services       Precautions / Restrictions Precautions Precautions: Fall Restrictions Weight Bearing Restrictions: No      Mobility Bed Mobility Overal bed mobility: Needs Assistance Bed Mobility: Supine to Sit     Supine to sit: Min assist     General bed mobility comments: Assist to pull to sit while pushing on rail as well. Labored movement.    Transfers Overall transfer level: Needs assistance Equipment used: Rolling walker (2 wheels) Transfers: Sit to/from Stand, Bed to chair/wheelchair/BSC Sit to Stand: Min guard     Step  pivot transfers: Min guard     General transfer comment: Labored movement but no physical assist needed for transfer to chair or form chair to toilet and back.      Balance Overall balance assessment: Needs assistance Sitting-balance support: No upper extremity supported, Feet supported Sitting balance-Leahy Scale: Good Sitting balance -  Comments: seated EOB   Standing balance support: Bilateral upper extremity supported, Reliant on assistive device for balance, During functional activity Standing balance-Leahy Scale: Fair Standing balance comment: using RW  fair                           ADL either performed or assessed with clinical judgement   ADL Overall ADL's : Needs assistance/impaired     Grooming: Min guard;Standing   Upper Body Bathing: Set up;Sitting   Lower Body Bathing: Minimal assistance;Sitting/lateral leans   Upper Body Dressing : Set up;Sitting   Lower Body Dressing: Min guard;Minimal assistance;Sitting/lateral leans   Toilet Transfer: Min guard;Ambulation;Rolling walker (2 wheels) Toilet Transfer Details (indicate cue type and reason): Ambulation to toilet from chair with RW. Toileting- Clothing Manipulation and Hygiene: Moderate assistance;Maximal assistance;Sit to/from stand Toileting - Clothing Manipulation Details (indicate cue type and reason): Pt able to stand while this OT completed peri-care. Pt has been having active diarrhea.     Functional mobility during ADLs: Min guard;Rolling walker (2 wheels)       Vision Baseline Vision/History: 1 Wears glasses Ability to See in Adequate Light: 0 Adequate Patient Visual Report: No change from baseline                  Pertinent Vitals/Pain Pain Assessment Pain Assessment: 0-10 Pain Score: 10-Worst pain ever Pain Location: gluteal region (Pt reported being sore from repeated peri-care.) Pain Descriptors / Indicators: Sore Pain Intervention(s): Limited activity within patient's tolerance, Monitored during session, Repositioned     Hand Dominance Right   Extremity/Trunk Assessment Upper Extremity Assessment Upper Extremity Assessment: Generalized weakness   Lower Extremity Assessment Lower Extremity Assessment: Defer to PT evaluation   Cervical / Trunk Assessment Cervical / Trunk Assessment: Kyphotic    Communication Communication Communication: No difficulties   Cognition Arousal/Alertness: Awake/alert Behavior During Therapy: WFL for tasks assessed/performed Overall Cognitive Status: Within Functional Limits for tasks assessed                                                        Home Living Family/patient expects to be discharged to:: Private residence Living Arrangements: Spouse/significant other Available Help at Discharge: Family;Available PRN/intermittently Type of Home: House Home Access: Stairs to enter [employer] of Steps: 2 Entrance Stairs-Rails: Right (going up) Home Layout: One level     Bathroom Shower/Tub: Sponge bathes at baseline   Bathroom Toilet: Standard Bathroom Accessibility: No   Home Equipment: Conservation officer, nature (2 wheels);Cane - single point;BSC/3in1          Prior Functioning/Environment Prior Level of Function : Needs assist       Physical Assist : ADLs (physical)   ADLs (physical): IADLs Mobility Comments: [place] ambulator with RW ADLs Comments: Independent ADL; family assists with IADL's        OT Problem List: Decreased strength;Decreased activity tolerance;Impaired balance (sitting and/or standing)      OT Treatment/Interventions: Self-care/ADL training;Therapeutic exercise;Therapeutic activities;Balance training;Patient/family  education;DME and/or AE instruction    OT Goals(Current goals can be found in the care plan section) Acute Rehab OT Goals Patient Stated Goal: return home OT Goal Formulation: With patient/family Time For Goal Achievement: 06/04/22 Potential to Achieve Goals: Good  OT Frequency: Min 1X/week    Co-evaluation PT/OT/SLP Co-Evaluation/Treatment: Yes Reason for Co-Treatment: To address functional/ADL transfers   OT goals addressed during session: ADL's and self-care                       End of Session Equipment Utilized During Treatment: Rolling walker (2  wheels)  Activity Tolerance: Patient tolerated treatment well Patient left: in chair;with call bell/phone within reach;with family/visitor present  OT Visit Diagnosis: Unsteadiness on feet (R26.81);Other abnormalities of gait and mobility (R26.89);Muscle weakness (generalized) (M62.81)                Time: 1505-6979 OT Time Calculation (min): 30 min Charges:  OT General Charges $OT Visit: 1 Visit OT Evaluation $OT Eval Low Complexity: 1 Low  Carnell Casamento OT, MOT  Larey Seat 05/21/2022, 12:07 PM

## 2022-05-21 NOTE — TOC Progression Note (Signed)
Transition of Care [facility]) - Progression Note    Patient Details  Name: Jordan Ward MRN: [id number] Date of Birth: August 30, 1941  Transition of Care [facility]) CM/SW [place], [place] Phone Number: 05/21/2022, 2:13 PM  Clinical Narrative:    CSW spoke with pt and daughter in room, also pts wife on speaker phone. CSW explained that PT is recommending HH PT at D/C. Pt states that he is agreeable to this and will not agree to SNF. Pt states that he would like [place] set up with [facility]. CSW sent referral to [facility] with [place]. CSW awaiting return answer as to if they can accept pt for [facility] services.   Expected Discharge Plan: [place] Barriers to Discharge: Continued Medical Work up  Expected Discharge Plan and Services Expected Discharge Plan: [place] In-house Referral: Clinical Social Work Discharge Planning Services: CM Consult Post Acute Care Choice: [place] arrangements for the past 2 months: Single Family Home                                       Social Determinants of Health (SDOH) Interventions    Readmission Risk Interventions    07/15/2021    9:54 AM 02/15/2021   11:47 AM 09/06/2020    1:26 PM  Readmission Risk Prevention Plan  Transportation Screening   Complete  PCP or Specialist Appt within 5-7 Days  Complete   PCP or Specialist Appt within 3-5 Days Complete    Home Care Screening  Complete   Medication Review (RN CM)  Complete   HRI or Home Care Consult Complete  Complete  Social Work Consult for [place] Planning/Counseling Complete  Complete  Palliative Care Screening Complete  Not Applicable  Medication Review Press photographer) Complete  Complete

## 2022-05-21 NOTE — Plan of Care (Signed)
  Problem: Acute Rehab OT Goals (only OT should resolve) Goal: Pt. Will Perform Grooming Flowsheets (Taken 05/21/2022 1211) Pt Will Perform Grooming:  with modified independence  standing Goal: Pt. Will Perform Lower Body Dressing Flowsheets (Taken 05/21/2022 1211) Pt Will Perform Lower Body Dressing:  with modified independence  sitting/lateral leans Goal: Pt. Will Transfer To Toilet Flowsheets (Taken 05/21/2022 1211) Pt Will Transfer to Toilet:  with modified independence  ambulating Goal: Pt. Will Perform Toileting-Clothing Manipulation Flowsheets (Taken 05/21/2022 1211) Pt Will Perform Toileting - Clothing Manipulation and hygiene:  with modified independence  sit to/from stand  sitting/lateral leans Goal: Pt/Caregiver Will Perform Home Exercise Program Flowsheets (Taken 05/21/2022 1211) Pt/caregiver will Perform Home Exercise Program:  Increased strength  Both right and left upper extremity  Independently  Ashlie Mcmenamy OT, MOT

## 2022-05-21 NOTE — Progress Notes (Signed)
Pharmacy Antibiotic Note  Jordan Ward is a 82 y.o. male admitted on 05/18/2022 with sepsis/UTI - chronic foley/nephrostomy.  Pharmacy has been consulted for Cefepime dosing.   Plan: Continue cefepime 2gm IV q24h Will f/u renal function, micro data, and pt's clinical condition    Height: '5\' 5"'$  (165.1 cm) Weight: 52.6 kg (116 lb) IBW/kg (Calculated) : 61.5  Temp (24hrs), Avg:99.4 F (37.4 C), Min:98.7 F (37.1 C), Max:100.6 F (38.1 C)  Recent Labs  Lab 05/18/22 2145 05/19/22 0648 05/20/22 0435 05/21/22 0519  WBC 15.3* 22.9* 22.8* 24.7*  CREATININE 2.35* 2.26* 1.92* 1.58*  LATICACIDVEN 1.8  --   --   --      Estimated Creatinine Clearance: 28.2 mL/min (A) (by C-G formula based on SCr of 1.58 mg/dL (H)).    Allergies  Allergen Reactions   Tetracyclines & Related Anaphylaxis and Rash   Ciprofloxacin     Trouble swallowing unknown reaction according to wife     Antimicrobials this admission: 8/20 Cefepime >>   Microbiology results: 8/20 BCx: ngtd 8/20 UCx:  multiple species  Thank you for allowing pharmacy to be a part of this patient's care.  Margot Ables, PharmD Clinical Pharmacist 05/21/2022 10:48 AM

## 2022-05-22 DIAGNOSIS — R6520 Severe sepsis without septic shock: Secondary | ICD-10-CM | POA: Diagnosis not present

## 2022-05-22 DIAGNOSIS — A419 Sepsis, unspecified organism: Secondary | ICD-10-CM | POA: Diagnosis not present

## 2022-05-22 DIAGNOSIS — J9601 Acute respiratory failure with hypoxia: Secondary | ICD-10-CM | POA: Diagnosis not present

## 2022-05-22 LAB — BASIC METABOLIC PANEL
Anion gap: 7 (ref 5–15)
BUN: 27 mg/dL — ABNORMAL HIGH (ref 8–23)
CO2: 21 mmol/L — ABNORMAL LOW (ref 22–32)
Calcium: 8.6 mg/dL — ABNORMAL LOW (ref 8.9–10.3)
Chloride: 113 mmol/L — ABNORMAL HIGH (ref 98–111)
Creatinine, Ser: 1.69 mg/dL — ABNORMAL HIGH (ref 0.61–1.24)
GFR, Estimated: 41 mL/min — ABNORMAL LOW (ref >60)
Glucose, Bld: 106 mg/dL — ABNORMAL HIGH (ref 70–99)
Potassium: 3.3 mmol/L — ABNORMAL LOW (ref 3.5–5.1)
Sodium: 141 mmol/L (ref 135–145)

## 2022-05-22 LAB — CBC
HCT: 36.3 % — ABNORMAL LOW (ref 39.0–52.0)
Hemoglobin: 11.8 g/dL — ABNORMAL LOW (ref 13.0–17.0)
MCH: 30.7 pg (ref 26.0–34.0)
MCHC: 32.5 g/dL (ref 30.0–36.0)
MCV: 94.5 fL (ref 80.0–100.0)
Platelets: 173 10*3/uL (ref 150–400)
RBC: 3.84 MIL/uL — ABNORMAL LOW (ref 4.22–5.81)
RDW: 14.9 % (ref 11.5–15.5)
WBC: 15.5 10*3/uL — ABNORMAL HIGH (ref 4.0–10.5)
nRBC: 0 % (ref 0.0–0.2)

## 2022-05-22 LAB — MAGNESIUM: Magnesium: 2.1 mg/dL (ref 1.7–2.4)

## 2022-05-22 MED ORDER — POTASSIUM CHLORIDE CRYS ER 20 MEQ PO TBCR
40.0000 meq | EXTENDED_RELEASE_TABLET | Freq: Once | ORAL | Status: AC
  Administered 2022-05-22: 40 meq via ORAL
  Filled 2022-05-22: qty 2

## 2022-05-22 MED ORDER — LOPERAMIDE HCL 2 MG PO TABS: 2.0000 mg | ORAL_TABLET | Freq: Four times a day (QID) | ORAL | 0 refills | Status: DC | PRN

## 2022-05-22 MED ORDER — AMOXICILLIN-POT CLAVULANATE 500-125 MG PO TABS: 1.0000 | ORAL_TABLET | Freq: Three times a day (TID) | ORAL | 0 refills | Status: AC

## 2022-05-22 NOTE — Care Management Important Message (Signed)
Important Message  Patient Details  Name: Jordan Ward MRN: [id number] Date of Birth: [birth year]   Medicare Important Message Given:  N/A - LOS <3 / Initial given by admissions     [name] 05/22/2022, 11:13 AM

## 2022-05-22 NOTE — TOC Transition Note (Signed)
Transition of Care [facility]) - CM/SW Discharge Note   Patient Details  Name: Jordan Ward MRN: [id number] Date of Birth: Feb 01, 1941  Transition of Care [facility]) CM/SW Contact:  Shade Flood, LCSW Phone Number: 05/22/2022, 11:30 AM   Clinical Narrative:     Pt stable for dc home with [facility] per MD. Katheren Puller at [facility]. No other TOC needs for dc.  Final next level of care: [place] Barriers to Discharge: Barriers Resolved   Patient Goals and CMS Choice Patient states their goals for this hospitalization and ongoing recovery are:: return home CMS Medicare.gov Compare Post Acute Care list provided to:: Patient Choice offered to / list presented to : Patient  Discharge Placement                       Discharge Plan and Services In-house Referral: Clinical Social Work Discharge Planning Services: CM Consult Post Acute Care Choice: Home Health                               Social Determinants of Health (SDOH) Interventions     Readmission Risk Interventions    07/15/2021    9:54 AM 02/15/2021   11:47 AM 09/06/2020    1:26 PM  Readmission Risk Prevention Plan  Transportation Screening   Complete  PCP or Specialist Appt within 5-7 Days  Complete   PCP or Specialist Appt within 3-5 Days Complete    Home Care Screening  Complete   Medication Review (RN CM)  Complete   HRI or Home Care Consult Complete  Complete  Social Work Consult for [place] Planning/Counseling Complete  Complete  Palliative Care Screening Complete  Not Applicable  Medication Review Press photographer) Complete  Complete

## 2022-05-22 NOTE — Discharge Summary (Signed)
Physician Discharge Summary  Jordan Ward BHA:[id number] DOB: November 13, 1940 DOA: 05/18/2022  PCP: Valentino Nose, FNP  Admit date: 05/18/2022  Discharge date: 05/22/2022  Admitted From:Home  Disposition:  Home  Recommendations for Outpatient Follow-up:  Follow up with PCP in 1-2 weeks Continue on Augmentin as prescribed for 3 more days to complete total 7-day course of treatment for pneumonia as well as finish treatment for UTI Follow-up with urology outpatient as previously scheduled for suprapubic catheter exchange Continue use of Imodium as needed for diarrhea Continue other home medications as prior  Home Health: Yes with RN, PT, home health aide  Equipment/Devices: None  Discharge Condition:Stable  CODE STATUS: Full  Diet recommendation: Heart Healthy  Brief/Interim Summary: Jordan Ward is a 81 y.o. male with medical history significant of chronic back pain, C. difficile colitis, coronary artery disease, CVA, suprapubic catheter, nephrostomy tube, dyslipidemia, hypothyroidism, neuromuscular disorder, and more presents ED with a chief complaint of chills.  Patient was admitted for sepsis, present on admission secondary to right lower lobe pneumonia as well as UTI in the setting of bilateral nephrostomy tubes and suprapubic catheter.  He was also noted to have some AKI on superimposed CKD, but this has resolved.  Urology consulted to assist with suprapubic catheter exchange during this admission.  Blood cultures with no growth to date and urine cultures with multiple species noted.  His pneumonia has improved and leukocytosis has trended down with no further fevers noted.  He was given some laxatives on 8/23 for constipation and continues to have some mild diarrhea this morning, but this should hopefully improve with the use of Imodium and there is no concern for actual GI related infection.  He has been seen by PT with recommendations for home health services and this will be arranged on  discharge.  No other acute events noted throughout the course of this admission.  Discharge Diagnoses:  Principal Problem:   Sepsis ([place]) Active Problems:   CAP (community acquired pneumonia)   Hypothyroidism   Acute kidney injury superimposed on CKD ([place])   Acute respiratory failure with hypoxia ([place])   Hypokalemia  Principal discharge diagnosis: Sepsis, present on admission, secondary to community-acquired pneumonia and UTI.  Discharge Instructions  Discharge Instructions     Diet - low sodium heart healthy   Complete by: As directed    Increase activity slowly   Complete by: As directed       Allergies as of 05/22/2022       Reactions   Tetracyclines & Related Anaphylaxis, Rash   Ciprofloxacin    Trouble swallowing unknown reaction according to wife         Medication List     TAKE these medications    acetaminophen 325 MG tablet Commonly known as: TYLENOL Take 650 mg by mouth every 6 (six) hours as needed for mild pain, moderate pain, fever or headache.   amLODipine 10 MG tablet Commonly known as: NORVASC Take 10 mg by mouth daily.   amoxicillin-clavulanate 500-125 MG tablet Commonly known as: Augmentin Take 1 tablet (500 mg total) by mouth 3 (three) times daily for 3 days.   diazepam 5 MG tablet Commonly known as: VALIUM Take 5 mg by mouth every 12 (twelve) hours as needed for muscle spasms. RX prescribed by his PCP office Valentino Nose, NP).   gabapentin 100 MG capsule Commonly known as: NEURONTIN Take 100 mg by mouth 3 (three) times daily as needed (pain).   HYDROcodone-acetaminophen 7.5-325 MG tablet Commonly known  as: NORCO Take 1 tablet by mouth 2 (two) times daily. Max APAP 3 GM IN 24 HOURS FROM ALL SOURCES What changed:  when to take this additional instructions   levofloxacin 500 MG tablet Commonly known as: LEVAQUIN Take 1 as directed What changed:  how much to take how to take this when to take this additional instructions    levothyroxine 75 MCG tablet Commonly known as: SYNTHROID Take 75 mcg by mouth daily.   loperamide 2 MG tablet Commonly known as: Imodium A-D Take 1 tablet (2 mg total) by mouth 4 (four) times daily as needed for diarrhea or loose stools.   OXYGEN Inhale 1 L into the lungs every evening.   potassium chloride SA 20 MEQ tablet Commonly known as: KLOR-CON M Take 20 mEq by mouth daily.        Follow-up Information     Valentino Nose, FNP. Schedule an appointment as soon as possible for a visit in 1 week(s).   Specialty: Family Medicine Contact information: [place] [address] [phone]                Allergies  Allergen Reactions   Tetracyclines & Related Anaphylaxis and Rash   Ciprofloxacin     Trouble swallowing unknown reaction according to wife     Consultations: Urology   Procedures/Studies: DG Chest Port 1 View  Result Date: 05/18/2022 CLINICAL DATA:  Questionable sepsis - evaluate for abnormality EXAM: PORTABLE CHEST 1 VIEW COMPARISON:  Radiograph 09/20/2020 FINDINGS: Patient rotation limits assessment. There is chronic interstitial coarsening with areas of scarring in the lung bases and right mid lung. a there may be small superimposed opacity in the periphery of the right lower lobe. Probable basilar honeycombing. The heart is normal in size. Aortic atherosclerosis and tortuosity. No pleural effusion or pneumothorax. IMPRESSION: 1. Chronic interstitial coarsening with areas of scarring in the lung bases and right mid lung. 2. Possible small acute opacity in the periphery of the right lower lobe, may represent superimposed pneumonia. Electronically Signed   By: Keith Rake M.D.   On: 05/18/2022 22:04     Discharge Exam: Vitals:   05/21/22 2100 05/22/22 0600  BP: 119/81 106/70  Pulse: 81 68  Resp: 18 18  Temp: 98.3 F (36.8 C) 97.7 F (36.5 C)  SpO2: 99% 100%   Vitals:   05/21/22 0500 05/21/22 1434 05/21/22 2100 05/22/22  0600  BP: 122/79 126/77 119/81 106/70  Pulse: 89 84 81 68  Resp: '17 18 18 18  '$ Temp: 98.8 F (37.1 C) 97.8 F (36.6 C) 98.3 F (36.8 C) 97.7 F (36.5 C)  TempSrc: Oral Oral Oral Oral  SpO2: 95% 98% 99% 100%  Weight:      Height:        General: Pt is alert, awake, not in acute distress Cardiovascular: RRR, S1/S2 +, no rubs, no gallops Respiratory: CTA bilaterally, no wheezing, no rhonchi Abdominal: Soft, NT, ND, bowel sounds + Extremities: no edema, no cyanosis Suprapubic catheter and bilateral nephrostomy tubes with clear, yellow urine output    The results of significant diagnostics from this hospitalization (including imaging, microbiology, ancillary and laboratory) are listed below for reference.     Microbiology: Recent Results (from the past 240 hour(s))  Urine Culture     Status: Abnormal   Collection Time: 05/18/22  9:26 PM   Specimen: In/Out Cath Urine  Result Value Ref Range Status   Specimen Description   Final    IN/OUT  CATH URINE Performed at [facility], [address], [place], [place] [address]    Special Requests   Final    NONE Performed at [facility], [address] [place], [place] [address]    Culture MULTIPLE SPECIES PRESENT, SUGGEST RECOLLECTION (A)  Final   Report Status 05/20/2022 FINAL  Final  Culture, blood (Routine x 2)     Status: None (Preliminary result)   Collection Time: 05/18/22  9:40 PM   Specimen: BLOOD RIGHT FOREARM  Result Value Ref Range Status   Specimen Description   Final    BLOOD RIGHT FOREARM BOTTLES DRAWN AEROBIC AND ANAEROBIC   Special Requests Blood Culture adequate volume  Final   Culture   Final    NO GROWTH 4 DAYS Performed at [facility], [address] [place], [place] [address]    Report Status PENDING  Incomplete  Resp Panel by RT-PCR (Flu A&B, Covid) Anterior Nasal Swab     Status: None   Collection Time: 05/18/22  9:43 PM   Specimen: Anterior Nasal Swab  Result Value Ref Range Status   SARS  Coronavirus 2 by RT PCR NEGATIVE NEGATIVE Final    Comment: (NOTE) SARS-CoV-2 target nucleic acids are NOT DETECTED.  The SARS-CoV-2 RNA is generally detectable in upper respiratory specimens during the acute phase of infection. The lowest concentration of SARS-CoV-2 viral copies this assay can detect is 138 copies/mL. A negative result does not preclude SARS-Cov-2 infection and should not be used as the sole basis for treatment or other patient management decisions. A negative result may occur with  improper specimen collection/handling, submission of specimen other than nasopharyngeal swab, presence of viral mutation(s) within the areas targeted by this assay, and inadequate number of viral copies(<138 copies/mL). A negative result must be combined with clinical observations, patient history, and epidemiological information. The expected result is Negative.  Fact Sheet for Patients:  EntrepreneurPulse.com.au  Fact Sheet for Healthcare Providers:  IncredibleEmployment.be  This test is no t yet approved or cleared by the [place] FDA and  has been authorized for detection and/or diagnosis of SARS-CoV-2 by FDA under an Emergency Use Authorization (EUA). This EUA will remain  in effect (meaning this test can be used) for the duration of the COVID-19 declaration under Section 564(b)(1) of the Act, 21 U.S.C.section 360bbb-3(b)(1), unless the authorization is terminated  or revoked sooner.       Influenza A by PCR NEGATIVE NEGATIVE Final   Influenza B by PCR NEGATIVE NEGATIVE Final    Comment: (NOTE) The Xpert Xpress SARS-CoV-2/FLU/RSV plus assay is intended as an aid in the diagnosis of influenza from Nasopharyngeal swab specimens and should not be used as a sole basis for treatment. Nasal washings and aspirates are unacceptable for Xpert Xpress SARS-CoV-2/FLU/RSV testing.  Fact Sheet for  Patients: EntrepreneurPulse.com.au  Fact Sheet for Healthcare Providers: IncredibleEmployment.be  This test is not yet approved or cleared by the [place] FDA and has been authorized for detection and/or diagnosis of SARS-CoV-2 by FDA under an Emergency Use Authorization (EUA). This EUA will remain in effect (meaning this test can be used) for the duration of the COVID-19 declaration under Section 564(b)(1) of the Act, 21 U.S.C. section 360bbb-3(b)(1), unless the authorization is terminated or revoked.  Performed at [facility], [address] [place], [place] [address]   Culture, blood (Routine x 2)     Status: None (Preliminary result)   Collection Time: 05/18/22  9:46 PM   Specimen: BLOOD LEFT HAND  Result Value Ref Range Status   Specimen Description   Final    BLOOD LEFT HAND BOTTLES DRAWN AEROBIC AND ANAEROBIC   Special Requests Blood Culture adequate volume  Final   Culture   Final    NO GROWTH 4 DAYS Performed at [facility], [address], [place], [place] [address]    Report Status PENDING  Incomplete  MRSA Next Gen by PCR, Nasal     Status: None   Collection Time: 05/19/22  4:45 PM   Specimen: Nasal Mucosa; Nasal Swab  Result Value Ref Range Status   MRSA by PCR Next Gen NOT DETECTED NOT DETECTED Final    Comment: (NOTE) The GeneXpert MRSA Assay (FDA approved for NASAL specimens only), is one component of a comprehensive MRSA colonization surveillance program. It is not intended to diagnose MRSA infection nor to guide or monitor treatment for MRSA infections. Test performance is not FDA approved in patients less than 45 years old. Performed at [facility], [address], [place], [place] [address]      Labs: BNP (last 3 results) No results for input(s): "BNP" in the last 8760 hours. Basic Metabolic Panel: Recent Labs  Lab 05/18/22 2145 05/19/22 0648 05/20/22 0435 05/21/22 0519 05/22/22 0631  NA 138 140 143 143  141  K 3.4* 3.6 2.9* 3.8 3.3*  CL 102 108 116* 120* 113*  CO2 '24 23 22 '$ 20* 21*  GLUCOSE 104* 129* 126* 127* 106*  BUN 29* 28* 30* 23 27*  CREATININE 2.35* 2.26* 1.92* 1.58* 1.69*  CALCIUM 9.1 8.9 8.5* 8.3* 8.6*  MG  --  2.0 2.1 2.1 2.1   Liver Function Tests: Recent Labs  Lab 05/18/22 2145 05/19/22 0648  AST 27 28  ALT 20 18  ALKPHOS 147* 123  BILITOT 0.5 1.0  PROT 7.8 7.3  ALBUMIN 4.0 3.4*   No results for input(s): "LIPASE", "AMYLASE" in the last 168 hours. No results for input(s): "AMMONIA" in the last 168 hours. CBC: Recent Labs  Lab 05/18/22 2145 05/19/22 0648 05/20/22 0435 05/21/22 0519 05/22/22 0631  WBC 15.3* 22.9* 22.8* 24.7* 15.5*  NEUTROABS 13.8* 20.0*  --   --   --   HGB 13.4 12.5* 12.8* 12.7* 11.8*  HCT 36.3* 35.7* 37.1* 38.1* 36.3*  MCV 96.3 93.5 93.0 92.9 94.5  PLT 208 188 175 173 173   Cardiac Enzymes: No results for input(s): "CKTOTAL", "CKMB", "CKMBINDEX", "TROPONINI" in the last 168 hours. BNP: Invalid input(s): "POCBNP" CBG: No results for input(s): "GLUCAP" in the last 168 hours. D-Dimer No results for input(s): "DDIMER" in the last 72 hours. Hgb A1c No results for input(s): "HGBA1C" in the last 72 hours. Lipid Profile No results for input(s): "CHOL", "HDL", "LDLCALC", "TRIG", "CHOLHDL", "LDLDIRECT" in the last 72 hours. Thyroid function studies No results for input(s): "TSH", "T4TOTAL", "T3FREE", "THYROIDAB" in the last 72 hours.  Invalid input(s): "FREET3" Anemia work up No results for input(s): "VITAMINB12", "FOLATE", "FERRITIN", "TIBC", "IRON", "RETICCTPCT" in the last 72 hours. Urinalysis    Component Value Date/Time   COLORURINE YELLOW 05/18/2022 2112   APPEARANCEUR HAZY (A) 05/18/2022 2112   LABSPEC 1.006 05/18/2022 2112   PHURINE 8.0 05/18/2022 2112   GLUCOSEU NEGATIVE 05/18/2022 2112   HGBUR SMALL (A) 05/18/2022 2112   BILIRUBINUR NEGATIVE 05/18/2022 2112   BILIRUBINUR small (A) 10/26/2021 1538   KETONESUR NEGATIVE  05/18/2022 2112   PROTEINUR 100 (A) 05/18/2022 2112   UROBILINOGEN 0.2 10/26/2021 1538   UROBILINOGEN 0.2 07/17/2014 1215   NITRITE  POSITIVE (A) 05/18/2022 2112   LEUKOCYTESUR MODERATE (A) 05/18/2022 2112   Sepsis Labs Recent Labs  Lab 05/19/22 0648 05/20/22 0435 05/21/22 0519 05/22/22 0631  WBC 22.9* 22.8* 24.7* 15.5*   Microbiology Recent Results (from the past 240 hour(s))  Urine Culture     Status: Abnormal   Collection Time: 05/18/22  9:26 PM   Specimen: In/Out Cath Urine  Result Value Ref Range Status   Specimen Description   Final    IN/OUT CATH URINE Performed at [facility], [address], [place], [place] [address]    Special Requests   Final    NONE Performed at [facility], [address] [place], [place] [address]    Culture MULTIPLE SPECIES PRESENT, SUGGEST RECOLLECTION (A)  Final   Report Status 05/20/2022 FINAL  Final  Culture, blood (Routine x 2)     Status: None (Preliminary result)   Collection Time: 05/18/22  9:40 PM   Specimen: BLOOD RIGHT FOREARM  Result Value Ref Range Status   Specimen Description   Final    BLOOD RIGHT FOREARM BOTTLES DRAWN AEROBIC AND ANAEROBIC   Special Requests Blood Culture adequate volume  Final   Culture   Final    NO GROWTH 4 DAYS Performed at [facility], [address], [place], [place] [address]    Report Status PENDING  Incomplete  Resp Panel by RT-PCR (Flu A&B, Covid) Anterior Nasal Swab     Status: None   Collection Time: 05/18/22  9:43 PM   Specimen: Anterior Nasal Swab  Result Value Ref Range Status   SARS Coronavirus 2 by RT PCR NEGATIVE NEGATIVE Final    Comment: (NOTE) SARS-CoV-2 target nucleic acids are NOT DETECTED.  The SARS-CoV-2 RNA is generally detectable in upper respiratory specimens during the acute phase of infection. The lowest concentration of SARS-CoV-2 viral copies this assay can detect is 138 copies/mL. A negative result does not preclude SARS-Cov-2 infection and should not be used as  the sole basis for treatment or other patient management decisions. A negative result may occur with  improper specimen collection/handling, submission of specimen other than nasopharyngeal swab, presence of viral mutation(s) within the areas targeted by this assay, and inadequate number of viral copies(<138 copies/mL). A negative result must be combined with clinical observations, patient history, and epidemiological information. The expected result is Negative.  Fact Sheet for Patients:  EntrepreneurPulse.com.au  Fact Sheet for Healthcare Providers:  IncredibleEmployment.be  This test is no t yet approved or cleared by the [place] FDA and  has been authorized for detection and/or diagnosis of SARS-CoV-2 by FDA under an Emergency Use Authorization (EUA). This EUA will remain  in effect (meaning this test can be used) for the duration of the COVID-19 declaration under Section 564(b)(1) of the Act, 21 U.S.C.section 360bbb-3(b)(1), unless the authorization is terminated  or revoked sooner.       Influenza A by PCR NEGATIVE NEGATIVE Final   Influenza B by PCR NEGATIVE NEGATIVE Final    Comment: (NOTE) The Xpert Xpress SARS-CoV-2/FLU/RSV plus assay is intended as an aid in the diagnosis of influenza from Nasopharyngeal swab specimens and should not be used as a sole basis for treatment. Nasal washings and aspirates are unacceptable for Xpert Xpress SARS-CoV-2/FLU/RSV testing.  Fact Sheet for Patients: EntrepreneurPulse.com.au  Fact Sheet for Healthcare Providers: IncredibleEmployment.be  This test is not yet approved or cleared by the [place] FDA and has been authorized for detection and/or diagnosis of SARS-CoV-2 by FDA under an  Emergency Use Authorization (EUA). This EUA will remain in effect (meaning this test can be used) for the duration of the COVID-19 declaration under Section 564(b)(1) of  the Act, 21 U.S.C. section 360bbb-3(b)(1), unless the authorization is terminated or revoked.  Performed at [facility], [address], [place], [place] [address]   Culture, blood (Routine x 2)     Status: None (Preliminary result)   Collection Time: 05/18/22  9:46 PM   Specimen: BLOOD LEFT HAND  Result Value Ref Range Status   Specimen Description   Final    BLOOD LEFT HAND BOTTLES DRAWN AEROBIC AND ANAEROBIC   Special Requests Blood Culture adequate volume  Final   Culture   Final    NO GROWTH 4 DAYS Performed at [facility], [address] [place], [place] [address]    Report Status PENDING  Incomplete  MRSA Next Gen by PCR, Nasal     Status: None   Collection Time: 05/19/22  4:45 PM   Specimen: Nasal Mucosa; Nasal Swab  Result Value Ref Range Status   MRSA by PCR Next Gen NOT DETECTED NOT DETECTED Final    Comment: (NOTE) The GeneXpert MRSA Assay (FDA approved for NASAL specimens only), is one component of a comprehensive MRSA colonization surveillance program. It is not intended to diagnose MRSA infection nor to guide or monitor treatment for MRSA infections. Test performance is not FDA approved in patients less than 37 years old. Performed at [facility], [address] [place], [place] [address]      Time coordinating discharge: 35 minutes  SIGNED:   Rodena Goldmann, DO [facility] 05/22/2022, 9:19 AM  If 7PM-7AM, please contact night-coverage [email or web address]

## 2022-05-23 DIAGNOSIS — I739 Peripheral vascular disease, unspecified: Secondary | ICD-10-CM | POA: Diagnosis not present

## 2022-05-23 DIAGNOSIS — Z9181 History of falling: Secondary | ICD-10-CM | POA: Diagnosis not present

## 2022-05-23 DIAGNOSIS — T83511A Infection and inflammatory reaction due to indwelling urethral catheter, initial encounter: Secondary | ICD-10-CM | POA: Diagnosis not present

## 2022-05-23 DIAGNOSIS — G8929 Other chronic pain: Secondary | ICD-10-CM | POA: Diagnosis not present

## 2022-05-23 DIAGNOSIS — I6529 Occlusion and stenosis of unspecified carotid artery: Secondary | ICD-10-CM | POA: Diagnosis not present

## 2022-05-23 DIAGNOSIS — G4733 Obstructive sleep apnea (adult) (pediatric): Secondary | ICD-10-CM | POA: Diagnosis not present

## 2022-05-23 DIAGNOSIS — E876 Hypokalemia: Secondary | ICD-10-CM | POA: Diagnosis not present

## 2022-05-23 DIAGNOSIS — I2510 Atherosclerotic heart disease of native coronary artery without angina pectoris: Secondary | ICD-10-CM | POA: Diagnosis not present

## 2022-05-23 DIAGNOSIS — I129 Hypertensive chronic kidney disease with stage 1 through stage 4 chronic kidney disease, or unspecified chronic kidney disease: Secondary | ICD-10-CM | POA: Diagnosis not present

## 2022-05-23 DIAGNOSIS — G35 Multiple sclerosis: Secondary | ICD-10-CM | POA: Diagnosis not present

## 2022-05-23 DIAGNOSIS — I69354 Hemiplegia and hemiparesis following cerebral infarction affecting left non-dominant side: Secondary | ICD-10-CM | POA: Diagnosis not present

## 2022-05-23 DIAGNOSIS — J189 Pneumonia, unspecified organism: Secondary | ICD-10-CM | POA: Diagnosis not present

## 2022-05-23 DIAGNOSIS — J9601 Acute respiratory failure with hypoxia: Secondary | ICD-10-CM | POA: Diagnosis not present

## 2022-05-23 DIAGNOSIS — A419 Sepsis, unspecified organism: Secondary | ICD-10-CM | POA: Diagnosis not present

## 2022-05-23 DIAGNOSIS — R1310 Dysphagia, unspecified: Secondary | ICD-10-CM | POA: Diagnosis not present

## 2022-05-23 DIAGNOSIS — N189 Chronic kidney disease, unspecified: Secondary | ICD-10-CM | POA: Diagnosis not present

## 2022-05-23 DIAGNOSIS — Z9981 Dependence on supplemental oxygen: Secondary | ICD-10-CM | POA: Diagnosis not present

## 2022-05-23 DIAGNOSIS — I471 Supraventricular tachycardia: Secondary | ICD-10-CM | POA: Diagnosis not present

## 2022-05-23 DIAGNOSIS — T83512A Infection and inflammatory reaction due to nephrostomy catheter, initial encounter: Secondary | ICD-10-CM | POA: Diagnosis not present

## 2022-05-23 DIAGNOSIS — M1990 Unspecified osteoarthritis, unspecified site: Secondary | ICD-10-CM | POA: Diagnosis not present

## 2022-05-23 DIAGNOSIS — D631 Anemia in chronic kidney disease: Secondary | ICD-10-CM | POA: Diagnosis not present

## 2022-05-23 DIAGNOSIS — E785 Hyperlipidemia, unspecified: Secondary | ICD-10-CM | POA: Diagnosis not present

## 2022-05-23 DIAGNOSIS — B9689 Other specified bacterial agents as the cause of diseases classified elsewhere: Secondary | ICD-10-CM | POA: Diagnosis not present

## 2022-05-23 DIAGNOSIS — N390 Urinary tract infection, site not specified: Secondary | ICD-10-CM | POA: Diagnosis not present

## 2022-05-23 DIAGNOSIS — M545 Low back pain, unspecified: Secondary | ICD-10-CM | POA: Diagnosis not present

## 2022-05-23 LAB — CULTURE, BLOOD (ROUTINE X 2)
Culture: NO GROWTH
Culture: NO GROWTH
Special Requests: ADEQUATE
Special Requests: ADEQUATE

## 2022-05-26 DIAGNOSIS — A419 Sepsis, unspecified organism: Secondary | ICD-10-CM | POA: Diagnosis not present

## 2022-05-26 DIAGNOSIS — J189 Pneumonia, unspecified organism: Secondary | ICD-10-CM | POA: Diagnosis not present

## 2022-05-26 DIAGNOSIS — J9601 Acute respiratory failure with hypoxia: Secondary | ICD-10-CM | POA: Diagnosis not present

## 2022-05-26 DIAGNOSIS — T83511A Infection and inflammatory reaction due to indwelling urethral catheter, initial encounter: Secondary | ICD-10-CM | POA: Diagnosis not present

## 2022-05-26 DIAGNOSIS — N390 Urinary tract infection, site not specified: Secondary | ICD-10-CM | POA: Diagnosis not present

## 2022-05-26 DIAGNOSIS — T83512A Infection and inflammatory reaction due to nephrostomy catheter, initial encounter: Secondary | ICD-10-CM | POA: Diagnosis not present

## 2022-05-28 DIAGNOSIS — J9601 Acute respiratory failure with hypoxia: Secondary | ICD-10-CM | POA: Diagnosis not present

## 2022-05-28 DIAGNOSIS — N390 Urinary tract infection, site not specified: Secondary | ICD-10-CM | POA: Diagnosis not present

## 2022-05-28 DIAGNOSIS — T83511A Infection and inflammatory reaction due to indwelling urethral catheter, initial encounter: Secondary | ICD-10-CM | POA: Diagnosis not present

## 2022-05-28 DIAGNOSIS — J189 Pneumonia, unspecified organism: Secondary | ICD-10-CM | POA: Diagnosis not present

## 2022-05-28 DIAGNOSIS — T83512A Infection and inflammatory reaction due to nephrostomy catheter, initial encounter: Secondary | ICD-10-CM | POA: Diagnosis not present

## 2022-05-28 DIAGNOSIS — A419 Sepsis, unspecified organism: Secondary | ICD-10-CM | POA: Diagnosis not present

## 2022-05-29 DIAGNOSIS — B351 Tinea unguium: Secondary | ICD-10-CM | POA: Diagnosis not present

## 2022-05-29 DIAGNOSIS — N1831 Chronic kidney disease, stage 3a: Secondary | ICD-10-CM | POA: Diagnosis not present

## 2022-05-29 DIAGNOSIS — M79675 Pain in left toe(s): Secondary | ICD-10-CM | POA: Diagnosis not present

## 2022-05-29 DIAGNOSIS — A419 Sepsis, unspecified organism: Secondary | ICD-10-CM | POA: Diagnosis not present

## 2022-05-29 DIAGNOSIS — M79674 Pain in right toe(s): Secondary | ICD-10-CM | POA: Diagnosis not present

## 2022-05-29 DIAGNOSIS — R7303 Prediabetes: Secondary | ICD-10-CM | POA: Diagnosis not present

## 2022-05-29 DIAGNOSIS — L8995 Pressure ulcer of unspecified site, unstageable: Secondary | ICD-10-CM | POA: Diagnosis not present

## 2022-05-29 DIAGNOSIS — J189 Pneumonia, unspecified organism: Secondary | ICD-10-CM | POA: Diagnosis not present

## 2022-05-29 DIAGNOSIS — F320 Major depressive disorder, single episode, mild: Secondary | ICD-10-CM | POA: Diagnosis not present

## 2022-05-29 DIAGNOSIS — I10 Essential (primary) hypertension: Secondary | ICD-10-CM | POA: Diagnosis not present

## 2022-05-30 DIAGNOSIS — J189 Pneumonia, unspecified organism: Secondary | ICD-10-CM | POA: Diagnosis not present

## 2022-05-30 DIAGNOSIS — T83511A Infection and inflammatory reaction due to indwelling urethral catheter, initial encounter: Secondary | ICD-10-CM | POA: Diagnosis not present

## 2022-05-30 DIAGNOSIS — J9601 Acute respiratory failure with hypoxia: Secondary | ICD-10-CM | POA: Diagnosis not present

## 2022-05-30 DIAGNOSIS — N390 Urinary tract infection, site not specified: Secondary | ICD-10-CM | POA: Diagnosis not present

## 2022-05-30 DIAGNOSIS — A419 Sepsis, unspecified organism: Secondary | ICD-10-CM | POA: Diagnosis not present

## 2022-05-30 DIAGNOSIS — T83512A Infection and inflammatory reaction due to nephrostomy catheter, initial encounter: Secondary | ICD-10-CM | POA: Diagnosis not present

## 2022-06-03 DIAGNOSIS — T83512A Infection and inflammatory reaction due to nephrostomy catheter, initial encounter: Secondary | ICD-10-CM | POA: Diagnosis not present

## 2022-06-03 DIAGNOSIS — J189 Pneumonia, unspecified organism: Secondary | ICD-10-CM | POA: Diagnosis not present

## 2022-06-03 DIAGNOSIS — T83511A Infection and inflammatory reaction due to indwelling urethral catheter, initial encounter: Secondary | ICD-10-CM | POA: Diagnosis not present

## 2022-06-03 DIAGNOSIS — J9601 Acute respiratory failure with hypoxia: Secondary | ICD-10-CM | POA: Diagnosis not present

## 2022-06-03 DIAGNOSIS — N390 Urinary tract infection, site not specified: Secondary | ICD-10-CM | POA: Diagnosis not present

## 2022-06-03 DIAGNOSIS — A419 Sepsis, unspecified organism: Secondary | ICD-10-CM | POA: Diagnosis not present

## 2022-06-04 DIAGNOSIS — T83511A Infection and inflammatory reaction due to indwelling urethral catheter, initial encounter: Secondary | ICD-10-CM | POA: Diagnosis not present

## 2022-06-04 DIAGNOSIS — N390 Urinary tract infection, site not specified: Secondary | ICD-10-CM | POA: Diagnosis not present

## 2022-06-04 DIAGNOSIS — T83512A Infection and inflammatory reaction due to nephrostomy catheter, initial encounter: Secondary | ICD-10-CM | POA: Diagnosis not present

## 2022-06-04 DIAGNOSIS — J9601 Acute respiratory failure with hypoxia: Secondary | ICD-10-CM | POA: Diagnosis not present

## 2022-06-04 DIAGNOSIS — A419 Sepsis, unspecified organism: Secondary | ICD-10-CM | POA: Diagnosis not present

## 2022-06-04 DIAGNOSIS — J189 Pneumonia, unspecified organism: Secondary | ICD-10-CM | POA: Diagnosis not present

## 2022-06-05 DIAGNOSIS — T83512A Infection and inflammatory reaction due to nephrostomy catheter, initial encounter: Secondary | ICD-10-CM | POA: Diagnosis not present

## 2022-06-05 DIAGNOSIS — N390 Urinary tract infection, site not specified: Secondary | ICD-10-CM | POA: Diagnosis not present

## 2022-06-05 DIAGNOSIS — J189 Pneumonia, unspecified organism: Secondary | ICD-10-CM | POA: Diagnosis not present

## 2022-06-05 DIAGNOSIS — A419 Sepsis, unspecified organism: Secondary | ICD-10-CM | POA: Diagnosis not present

## 2022-06-05 DIAGNOSIS — J9601 Acute respiratory failure with hypoxia: Secondary | ICD-10-CM | POA: Diagnosis not present

## 2022-06-05 DIAGNOSIS — T83511A Infection and inflammatory reaction due to indwelling urethral catheter, initial encounter: Secondary | ICD-10-CM | POA: Diagnosis not present

## 2022-06-06 DIAGNOSIS — T83511A Infection and inflammatory reaction due to indwelling urethral catheter, initial encounter: Secondary | ICD-10-CM | POA: Diagnosis not present

## 2022-06-06 DIAGNOSIS — J189 Pneumonia, unspecified organism: Secondary | ICD-10-CM | POA: Diagnosis not present

## 2022-06-06 DIAGNOSIS — J9601 Acute respiratory failure with hypoxia: Secondary | ICD-10-CM | POA: Diagnosis not present

## 2022-06-06 DIAGNOSIS — T83512A Infection and inflammatory reaction due to nephrostomy catheter, initial encounter: Secondary | ICD-10-CM | POA: Diagnosis not present

## 2022-06-06 DIAGNOSIS — N390 Urinary tract infection, site not specified: Secondary | ICD-10-CM | POA: Diagnosis not present

## 2022-06-06 DIAGNOSIS — A419 Sepsis, unspecified organism: Secondary | ICD-10-CM | POA: Diagnosis not present

## 2022-06-09 ENCOUNTER — Other Ambulatory Visit (HOSPITAL_COMMUNITY): Payer: Self-pay | Admitting: Interventional Radiology

## 2022-06-09 ENCOUNTER — Ambulatory Visit (HOSPITAL_COMMUNITY)
Admission: RE | Admit: 2022-06-09 | Discharge: 2022-06-09 | Disposition: A | Discharge status: Home / Self Care | Payer: Medicare Other | Source: Ambulatory Visit | Attending: Interventional Radiology | Admitting: Interventional Radiology

## 2022-06-09 DIAGNOSIS — G894 Chronic pain syndrome: Secondary | ICD-10-CM | POA: Diagnosis not present

## 2022-06-09 DIAGNOSIS — N319 Neuromuscular dysfunction of bladder, unspecified: Secondary | ICD-10-CM | POA: Insufficient documentation

## 2022-06-09 DIAGNOSIS — N261 Atrophy of kidney (terminal): Secondary | ICD-10-CM | POA: Diagnosis not present

## 2022-06-09 DIAGNOSIS — N179 Acute kidney failure, unspecified: Secondary | ICD-10-CM | POA: Diagnosis not present

## 2022-06-09 DIAGNOSIS — N139 Obstructive and reflux uropathy, unspecified: Secondary | ICD-10-CM | POA: Diagnosis not present

## 2022-06-09 DIAGNOSIS — I129 Hypertensive chronic kidney disease with stage 1 through stage 4 chronic kidney disease, or unspecified chronic kidney disease: Secondary | ICD-10-CM | POA: Diagnosis not present

## 2022-06-09 DIAGNOSIS — G35 Multiple sclerosis: Secondary | ICD-10-CM | POA: Diagnosis not present

## 2022-06-09 DIAGNOSIS — Z466 Encounter for fitting and adjustment of urinary device: Secondary | ICD-10-CM | POA: Diagnosis not present

## 2022-06-09 DIAGNOSIS — Z436 Encounter for attention to other artificial openings of urinary tract: Secondary | ICD-10-CM | POA: Insufficient documentation

## 2022-06-09 DIAGNOSIS — I2510 Atherosclerotic heart disease of native coronary artery without angina pectoris: Secondary | ICD-10-CM | POA: Diagnosis not present

## 2022-06-09 DIAGNOSIS — N184 Chronic kidney disease, stage 4 (severe): Secondary | ICD-10-CM | POA: Diagnosis not present

## 2022-06-09 DIAGNOSIS — R7303 Prediabetes: Secondary | ICD-10-CM | POA: Diagnosis not present

## 2022-06-09 DIAGNOSIS — G459 Transient cerebral ischemic attack, unspecified: Secondary | ICD-10-CM | POA: Diagnosis not present

## 2022-06-09 DIAGNOSIS — D631 Anemia in chronic kidney disease: Secondary | ICD-10-CM | POA: Diagnosis not present

## 2022-06-09 HISTORY — PX: IR NEPHROSTOMY EXCHANGE LEFT: IMG6069

## 2022-06-09 MED ORDER — LIDOCAINE HCL 1 % IJ SOLN
INTRAMUSCULAR | Status: AC
  Filled 2022-06-09: qty 20

## 2022-06-09 MED ORDER — IOHEXOL 300 MG/ML  SOLN
50.0000 mL | Freq: Once | INTRAMUSCULAR | Status: AC | PRN
  Administered 2022-06-09: 10 mL

## 2022-06-10 DIAGNOSIS — T83511A Infection and inflammatory reaction due to indwelling urethral catheter, initial encounter: Secondary | ICD-10-CM | POA: Diagnosis not present

## 2022-06-10 DIAGNOSIS — N390 Urinary tract infection, site not specified: Secondary | ICD-10-CM | POA: Diagnosis not present

## 2022-06-10 DIAGNOSIS — J189 Pneumonia, unspecified organism: Secondary | ICD-10-CM | POA: Diagnosis not present

## 2022-06-10 DIAGNOSIS — A419 Sepsis, unspecified organism: Secondary | ICD-10-CM | POA: Diagnosis not present

## 2022-06-10 DIAGNOSIS — T83512A Infection and inflammatory reaction due to nephrostomy catheter, initial encounter: Secondary | ICD-10-CM | POA: Diagnosis not present

## 2022-06-10 DIAGNOSIS — J9601 Acute respiratory failure with hypoxia: Secondary | ICD-10-CM | POA: Diagnosis not present

## 2022-06-12 DIAGNOSIS — T83512A Infection and inflammatory reaction due to nephrostomy catheter, initial encounter: Secondary | ICD-10-CM | POA: Diagnosis not present

## 2022-06-12 DIAGNOSIS — J189 Pneumonia, unspecified organism: Secondary | ICD-10-CM | POA: Diagnosis not present

## 2022-06-12 DIAGNOSIS — T83511A Infection and inflammatory reaction due to indwelling urethral catheter, initial encounter: Secondary | ICD-10-CM | POA: Diagnosis not present

## 2022-06-12 DIAGNOSIS — N390 Urinary tract infection, site not specified: Secondary | ICD-10-CM | POA: Diagnosis not present

## 2022-06-12 DIAGNOSIS — A419 Sepsis, unspecified organism: Secondary | ICD-10-CM | POA: Diagnosis not present

## 2022-06-12 DIAGNOSIS — J9601 Acute respiratory failure with hypoxia: Secondary | ICD-10-CM | POA: Diagnosis not present

## 2022-06-13 DIAGNOSIS — T83511A Infection and inflammatory reaction due to indwelling urethral catheter, initial encounter: Secondary | ICD-10-CM | POA: Diagnosis not present

## 2022-06-13 DIAGNOSIS — N390 Urinary tract infection, site not specified: Secondary | ICD-10-CM | POA: Diagnosis not present

## 2022-06-13 DIAGNOSIS — A419 Sepsis, unspecified organism: Secondary | ICD-10-CM | POA: Diagnosis not present

## 2022-06-13 DIAGNOSIS — J9601 Acute respiratory failure with hypoxia: Secondary | ICD-10-CM | POA: Diagnosis not present

## 2022-06-13 DIAGNOSIS — T83512A Infection and inflammatory reaction due to nephrostomy catheter, initial encounter: Secondary | ICD-10-CM | POA: Diagnosis not present

## 2022-06-13 DIAGNOSIS — J189 Pneumonia, unspecified organism: Secondary | ICD-10-CM | POA: Diagnosis not present

## 2022-06-16 DIAGNOSIS — T83512A Infection and inflammatory reaction due to nephrostomy catheter, initial encounter: Secondary | ICD-10-CM | POA: Diagnosis not present

## 2022-06-16 DIAGNOSIS — J189 Pneumonia, unspecified organism: Secondary | ICD-10-CM | POA: Diagnosis not present

## 2022-06-16 DIAGNOSIS — T83511A Infection and inflammatory reaction due to indwelling urethral catheter, initial encounter: Secondary | ICD-10-CM | POA: Diagnosis not present

## 2022-06-16 DIAGNOSIS — N390 Urinary tract infection, site not specified: Secondary | ICD-10-CM | POA: Diagnosis not present

## 2022-06-16 DIAGNOSIS — J9601 Acute respiratory failure with hypoxia: Secondary | ICD-10-CM | POA: Diagnosis not present

## 2022-06-16 DIAGNOSIS — A419 Sepsis, unspecified organism: Secondary | ICD-10-CM | POA: Diagnosis not present

## 2022-06-18 ENCOUNTER — Ambulatory Visit (INDEPENDENT_AMBULATORY_CARE_PROVIDER_SITE_OTHER): Payer: Medicare Other | Admitting: Physician Assistant

## 2022-06-18 DIAGNOSIS — J189 Pneumonia, unspecified organism: Secondary | ICD-10-CM | POA: Diagnosis not present

## 2022-06-18 DIAGNOSIS — J9601 Acute respiratory failure with hypoxia: Secondary | ICD-10-CM | POA: Diagnosis not present

## 2022-06-18 DIAGNOSIS — A419 Sepsis, unspecified organism: Secondary | ICD-10-CM | POA: Diagnosis not present

## 2022-06-18 DIAGNOSIS — R339 Retention of urine, unspecified: Secondary | ICD-10-CM

## 2022-06-18 DIAGNOSIS — N390 Urinary tract infection, site not specified: Secondary | ICD-10-CM | POA: Diagnosis not present

## 2022-06-18 DIAGNOSIS — T83511A Infection and inflammatory reaction due to indwelling urethral catheter, initial encounter: Secondary | ICD-10-CM | POA: Diagnosis not present

## 2022-06-18 DIAGNOSIS — T83512A Infection and inflammatory reaction due to nephrostomy catheter, initial encounter: Secondary | ICD-10-CM | POA: Diagnosis not present

## 2022-06-18 NOTE — Progress Notes (Signed)
Suprapubic Cath Change  Patient is present today for a suprapubic catheter change due to urinary retention.  51m of water was drained from the balloon, a 24FR foley cath was removed from the tract with out difficulty.  Site was cleaned and prepped in a sterile fashion with betadine.  A 24FR foley cath was replaced into the tract no complications were noted. Catheter was flushed with 30cc of sterile water with 30 cc noted, 10 ml of sterile water was inflated into the balloon and a leg bag was attached for drainage.  Patient tolerated well.     Performed by: KLevi Aland CMA  Follow up: Follow up in 1 month         Procedure reviewed. Agree with clinical documentation.  Julienne A Summerlin PA-C

## 2022-06-19 DIAGNOSIS — J9601 Acute respiratory failure with hypoxia: Secondary | ICD-10-CM | POA: Diagnosis not present

## 2022-06-19 DIAGNOSIS — J189 Pneumonia, unspecified organism: Secondary | ICD-10-CM | POA: Diagnosis not present

## 2022-06-19 DIAGNOSIS — A419 Sepsis, unspecified organism: Secondary | ICD-10-CM | POA: Diagnosis not present

## 2022-06-19 DIAGNOSIS — T83512A Infection and inflammatory reaction due to nephrostomy catheter, initial encounter: Secondary | ICD-10-CM | POA: Diagnosis not present

## 2022-06-19 DIAGNOSIS — T83511A Infection and inflammatory reaction due to indwelling urethral catheter, initial encounter: Secondary | ICD-10-CM | POA: Diagnosis not present

## 2022-06-19 DIAGNOSIS — N390 Urinary tract infection, site not specified: Secondary | ICD-10-CM | POA: Diagnosis not present

## 2022-06-22 DIAGNOSIS — I69354 Hemiplegia and hemiparesis following cerebral infarction affecting left non-dominant side: Secondary | ICD-10-CM | POA: Diagnosis not present

## 2022-06-22 DIAGNOSIS — N189 Chronic kidney disease, unspecified: Secondary | ICD-10-CM | POA: Diagnosis not present

## 2022-06-22 DIAGNOSIS — B9689 Other specified bacterial agents as the cause of diseases classified elsewhere: Secondary | ICD-10-CM | POA: Diagnosis not present

## 2022-06-22 DIAGNOSIS — J189 Pneumonia, unspecified organism: Secondary | ICD-10-CM | POA: Diagnosis not present

## 2022-06-22 DIAGNOSIS — M545 Low back pain, unspecified: Secondary | ICD-10-CM | POA: Diagnosis not present

## 2022-06-22 DIAGNOSIS — I2510 Atherosclerotic heart disease of native coronary artery without angina pectoris: Secondary | ICD-10-CM | POA: Diagnosis not present

## 2022-06-22 DIAGNOSIS — R1310 Dysphagia, unspecified: Secondary | ICD-10-CM | POA: Diagnosis not present

## 2022-06-22 DIAGNOSIS — E785 Hyperlipidemia, unspecified: Secondary | ICD-10-CM | POA: Diagnosis not present

## 2022-06-22 DIAGNOSIS — G8929 Other chronic pain: Secondary | ICD-10-CM | POA: Diagnosis not present

## 2022-06-22 DIAGNOSIS — E876 Hypokalemia: Secondary | ICD-10-CM | POA: Diagnosis not present

## 2022-06-22 DIAGNOSIS — I739 Peripheral vascular disease, unspecified: Secondary | ICD-10-CM | POA: Diagnosis not present

## 2022-06-22 DIAGNOSIS — G35 Multiple sclerosis: Secondary | ICD-10-CM | POA: Diagnosis not present

## 2022-06-22 DIAGNOSIS — Z9181 History of falling: Secondary | ICD-10-CM | POA: Diagnosis not present

## 2022-06-22 DIAGNOSIS — T83511A Infection and inflammatory reaction due to indwelling urethral catheter, initial encounter: Secondary | ICD-10-CM | POA: Diagnosis not present

## 2022-06-22 DIAGNOSIS — J9601 Acute respiratory failure with hypoxia: Secondary | ICD-10-CM | POA: Diagnosis not present

## 2022-06-22 DIAGNOSIS — I6529 Occlusion and stenosis of unspecified carotid artery: Secondary | ICD-10-CM | POA: Diagnosis not present

## 2022-06-22 DIAGNOSIS — Z9981 Dependence on supplemental oxygen: Secondary | ICD-10-CM | POA: Diagnosis not present

## 2022-06-22 DIAGNOSIS — I471 Supraventricular tachycardia, unspecified: Secondary | ICD-10-CM | POA: Diagnosis not present

## 2022-06-22 DIAGNOSIS — M1990 Unspecified osteoarthritis, unspecified site: Secondary | ICD-10-CM | POA: Diagnosis not present

## 2022-06-22 DIAGNOSIS — G4733 Obstructive sleep apnea (adult) (pediatric): Secondary | ICD-10-CM | POA: Diagnosis not present

## 2022-06-22 DIAGNOSIS — N390 Urinary tract infection, site not specified: Secondary | ICD-10-CM | POA: Diagnosis not present

## 2022-06-22 DIAGNOSIS — I129 Hypertensive chronic kidney disease with stage 1 through stage 4 chronic kidney disease, or unspecified chronic kidney disease: Secondary | ICD-10-CM | POA: Diagnosis not present

## 2022-06-22 DIAGNOSIS — T83512A Infection and inflammatory reaction due to nephrostomy catheter, initial encounter: Secondary | ICD-10-CM | POA: Diagnosis not present

## 2022-06-22 DIAGNOSIS — A419 Sepsis, unspecified organism: Secondary | ICD-10-CM | POA: Diagnosis not present

## 2022-06-22 DIAGNOSIS — D631 Anemia in chronic kidney disease: Secondary | ICD-10-CM | POA: Diagnosis not present

## 2022-06-24 DIAGNOSIS — J189 Pneumonia, unspecified organism: Secondary | ICD-10-CM | POA: Diagnosis not present

## 2022-06-24 DIAGNOSIS — J9601 Acute respiratory failure with hypoxia: Secondary | ICD-10-CM | POA: Diagnosis not present

## 2022-06-24 DIAGNOSIS — A419 Sepsis, unspecified organism: Secondary | ICD-10-CM | POA: Diagnosis not present

## 2022-06-24 DIAGNOSIS — T83512A Infection and inflammatory reaction due to nephrostomy catheter, initial encounter: Secondary | ICD-10-CM | POA: Diagnosis not present

## 2022-06-24 DIAGNOSIS — T83511A Infection and inflammatory reaction due to indwelling urethral catheter, initial encounter: Secondary | ICD-10-CM | POA: Diagnosis not present

## 2022-06-24 DIAGNOSIS — N390 Urinary tract infection, site not specified: Secondary | ICD-10-CM | POA: Diagnosis not present

## 2022-06-25 DIAGNOSIS — T83511A Infection and inflammatory reaction due to indwelling urethral catheter, initial encounter: Secondary | ICD-10-CM | POA: Diagnosis not present

## 2022-06-25 DIAGNOSIS — J9601 Acute respiratory failure with hypoxia: Secondary | ICD-10-CM | POA: Diagnosis not present

## 2022-06-25 DIAGNOSIS — J189 Pneumonia, unspecified organism: Secondary | ICD-10-CM | POA: Diagnosis not present

## 2022-06-25 DIAGNOSIS — N390 Urinary tract infection, site not specified: Secondary | ICD-10-CM | POA: Diagnosis not present

## 2022-06-25 DIAGNOSIS — A419 Sepsis, unspecified organism: Secondary | ICD-10-CM | POA: Diagnosis not present

## 2022-06-25 DIAGNOSIS — T83512A Infection and inflammatory reaction due to nephrostomy catheter, initial encounter: Secondary | ICD-10-CM | POA: Diagnosis not present

## 2022-06-26 ENCOUNTER — Other Ambulatory Visit (HOSPITAL_COMMUNITY): Payer: Self-pay | Admitting: Family Medicine

## 2022-06-26 DIAGNOSIS — N390 Urinary tract infection, site not specified: Secondary | ICD-10-CM | POA: Diagnosis not present

## 2022-06-26 DIAGNOSIS — A419 Sepsis, unspecified organism: Secondary | ICD-10-CM | POA: Diagnosis not present

## 2022-06-26 DIAGNOSIS — T83512A Infection and inflammatory reaction due to nephrostomy catheter, initial encounter: Secondary | ICD-10-CM | POA: Diagnosis not present

## 2022-06-26 DIAGNOSIS — J189 Pneumonia, unspecified organism: Secondary | ICD-10-CM | POA: Diagnosis not present

## 2022-06-26 DIAGNOSIS — T83511A Infection and inflammatory reaction due to indwelling urethral catheter, initial encounter: Secondary | ICD-10-CM | POA: Diagnosis not present

## 2022-06-26 DIAGNOSIS — J9601 Acute respiratory failure with hypoxia: Secondary | ICD-10-CM | POA: Diagnosis not present

## 2022-06-26 DIAGNOSIS — M25512 Pain in left shoulder: Secondary | ICD-10-CM

## 2022-06-30 DIAGNOSIS — J9601 Acute respiratory failure with hypoxia: Secondary | ICD-10-CM | POA: Diagnosis not present

## 2022-06-30 DIAGNOSIS — T83512A Infection and inflammatory reaction due to nephrostomy catheter, initial encounter: Secondary | ICD-10-CM | POA: Diagnosis not present

## 2022-06-30 DIAGNOSIS — T83511A Infection and inflammatory reaction due to indwelling urethral catheter, initial encounter: Secondary | ICD-10-CM | POA: Diagnosis not present

## 2022-06-30 DIAGNOSIS — J189 Pneumonia, unspecified organism: Secondary | ICD-10-CM | POA: Diagnosis not present

## 2022-06-30 DIAGNOSIS — N390 Urinary tract infection, site not specified: Secondary | ICD-10-CM | POA: Diagnosis not present

## 2022-06-30 DIAGNOSIS — A419 Sepsis, unspecified organism: Secondary | ICD-10-CM | POA: Diagnosis not present

## 2022-07-01 DIAGNOSIS — J189 Pneumonia, unspecified organism: Secondary | ICD-10-CM | POA: Diagnosis not present

## 2022-07-01 DIAGNOSIS — T83511A Infection and inflammatory reaction due to indwelling urethral catheter, initial encounter: Secondary | ICD-10-CM | POA: Diagnosis not present

## 2022-07-01 DIAGNOSIS — J9601 Acute respiratory failure with hypoxia: Secondary | ICD-10-CM | POA: Diagnosis not present

## 2022-07-01 DIAGNOSIS — T83512A Infection and inflammatory reaction due to nephrostomy catheter, initial encounter: Secondary | ICD-10-CM | POA: Diagnosis not present

## 2022-07-01 DIAGNOSIS — N390 Urinary tract infection, site not specified: Secondary | ICD-10-CM | POA: Diagnosis not present

## 2022-07-01 DIAGNOSIS — A419 Sepsis, unspecified organism: Secondary | ICD-10-CM | POA: Diagnosis not present

## 2022-07-02 DIAGNOSIS — N390 Urinary tract infection, site not specified: Secondary | ICD-10-CM | POA: Diagnosis not present

## 2022-07-02 DIAGNOSIS — T83511A Infection and inflammatory reaction due to indwelling urethral catheter, initial encounter: Secondary | ICD-10-CM | POA: Diagnosis not present

## 2022-07-02 DIAGNOSIS — A419 Sepsis, unspecified organism: Secondary | ICD-10-CM | POA: Diagnosis not present

## 2022-07-02 DIAGNOSIS — J9601 Acute respiratory failure with hypoxia: Secondary | ICD-10-CM | POA: Diagnosis not present

## 2022-07-02 DIAGNOSIS — J189 Pneumonia, unspecified organism: Secondary | ICD-10-CM | POA: Diagnosis not present

## 2022-07-02 DIAGNOSIS — T83512A Infection and inflammatory reaction due to nephrostomy catheter, initial encounter: Secondary | ICD-10-CM | POA: Diagnosis not present

## 2022-07-03 DIAGNOSIS — B351 Tinea unguium: Secondary | ICD-10-CM | POA: Diagnosis not present

## 2022-07-03 DIAGNOSIS — M79674 Pain in right toe(s): Secondary | ICD-10-CM | POA: Diagnosis not present

## 2022-07-03 DIAGNOSIS — M79675 Pain in left toe(s): Secondary | ICD-10-CM | POA: Diagnosis not present

## 2022-07-09 DIAGNOSIS — A419 Sepsis, unspecified organism: Secondary | ICD-10-CM | POA: Diagnosis not present

## 2022-07-09 DIAGNOSIS — N390 Urinary tract infection, site not specified: Secondary | ICD-10-CM | POA: Diagnosis not present

## 2022-07-09 DIAGNOSIS — T83512A Infection and inflammatory reaction due to nephrostomy catheter, initial encounter: Secondary | ICD-10-CM | POA: Diagnosis not present

## 2022-07-09 DIAGNOSIS — J189 Pneumonia, unspecified organism: Secondary | ICD-10-CM | POA: Diagnosis not present

## 2022-07-09 DIAGNOSIS — J9601 Acute respiratory failure with hypoxia: Secondary | ICD-10-CM | POA: Diagnosis not present

## 2022-07-09 DIAGNOSIS — T83511A Infection and inflammatory reaction due to indwelling urethral catheter, initial encounter: Secondary | ICD-10-CM | POA: Diagnosis not present

## 2022-07-14 DIAGNOSIS — N390 Urinary tract infection, site not specified: Secondary | ICD-10-CM | POA: Diagnosis not present

## 2022-07-14 DIAGNOSIS — A419 Sepsis, unspecified organism: Secondary | ICD-10-CM | POA: Diagnosis not present

## 2022-07-14 DIAGNOSIS — T83512A Infection and inflammatory reaction due to nephrostomy catheter, initial encounter: Secondary | ICD-10-CM | POA: Diagnosis not present

## 2022-07-14 DIAGNOSIS — J9601 Acute respiratory failure with hypoxia: Secondary | ICD-10-CM | POA: Diagnosis not present

## 2022-07-14 DIAGNOSIS — T83511A Infection and inflammatory reaction due to indwelling urethral catheter, initial encounter: Secondary | ICD-10-CM | POA: Diagnosis not present

## 2022-07-14 DIAGNOSIS — J189 Pneumonia, unspecified organism: Secondary | ICD-10-CM | POA: Diagnosis not present

## 2022-07-16 ENCOUNTER — Ambulatory Visit (INDEPENDENT_AMBULATORY_CARE_PROVIDER_SITE_OTHER): Payer: Medicare Other | Admitting: Physician Assistant

## 2022-07-16 DIAGNOSIS — T83511A Infection and inflammatory reaction due to indwelling urethral catheter, initial encounter: Secondary | ICD-10-CM | POA: Diagnosis not present

## 2022-07-16 DIAGNOSIS — A419 Sepsis, unspecified organism: Secondary | ICD-10-CM | POA: Diagnosis not present

## 2022-07-16 DIAGNOSIS — N390 Urinary tract infection, site not specified: Secondary | ICD-10-CM | POA: Diagnosis not present

## 2022-07-16 DIAGNOSIS — J189 Pneumonia, unspecified organism: Secondary | ICD-10-CM | POA: Diagnosis not present

## 2022-07-16 DIAGNOSIS — R339 Retention of urine, unspecified: Secondary | ICD-10-CM

## 2022-07-16 DIAGNOSIS — T83512A Infection and inflammatory reaction due to nephrostomy catheter, initial encounter: Secondary | ICD-10-CM | POA: Diagnosis not present

## 2022-07-16 DIAGNOSIS — J9601 Acute respiratory failure with hypoxia: Secondary | ICD-10-CM | POA: Diagnosis not present

## 2022-07-16 NOTE — Progress Notes (Signed)
Suprapubic Cath Change  Patient is present today for a suprapubic catheter change due to urinary retention.  34m of water was drained from the balloon, a 24FR foley cath was removed from the tract with out difficulty.  Site was cleaned and prepped in a sterile fashion with betadine.  A 24FR foley cath was replaced into the tract no complications were noted. Urine return was noted, 10 ml of sterile water was inflated into the balloon and a leg bag was attached for drainage.  Patient tolerated well.    Performed by: KLevi Aland CMA  Follow up: Follow up as scheduled.    Procedure reviewed. Agree with clinical documentation.  Julienne A Summerlin PA-C

## 2022-07-18 DIAGNOSIS — T83511A Infection and inflammatory reaction due to indwelling urethral catheter, initial encounter: Secondary | ICD-10-CM | POA: Diagnosis not present

## 2022-07-18 DIAGNOSIS — N390 Urinary tract infection, site not specified: Secondary | ICD-10-CM | POA: Diagnosis not present

## 2022-07-18 DIAGNOSIS — T83512A Infection and inflammatory reaction due to nephrostomy catheter, initial encounter: Secondary | ICD-10-CM | POA: Diagnosis not present

## 2022-07-18 DIAGNOSIS — A419 Sepsis, unspecified organism: Secondary | ICD-10-CM | POA: Diagnosis not present

## 2022-07-18 DIAGNOSIS — J9601 Acute respiratory failure with hypoxia: Secondary | ICD-10-CM | POA: Diagnosis not present

## 2022-07-18 DIAGNOSIS — J189 Pneumonia, unspecified organism: Secondary | ICD-10-CM | POA: Diagnosis not present

## 2022-07-22 DIAGNOSIS — G4733 Obstructive sleep apnea (adult) (pediatric): Secondary | ICD-10-CM | POA: Diagnosis not present

## 2022-07-22 DIAGNOSIS — D631 Anemia in chronic kidney disease: Secondary | ICD-10-CM | POA: Diagnosis not present

## 2022-07-22 DIAGNOSIS — Z87891 Personal history of nicotine dependence: Secondary | ICD-10-CM | POA: Diagnosis not present

## 2022-07-22 DIAGNOSIS — Z9181 History of falling: Secondary | ICD-10-CM | POA: Diagnosis not present

## 2022-07-22 DIAGNOSIS — G8929 Other chronic pain: Secondary | ICD-10-CM | POA: Diagnosis not present

## 2022-07-22 DIAGNOSIS — I6529 Occlusion and stenosis of unspecified carotid artery: Secondary | ICD-10-CM | POA: Diagnosis not present

## 2022-07-22 DIAGNOSIS — I2510 Atherosclerotic heart disease of native coronary artery without angina pectoris: Secondary | ICD-10-CM | POA: Diagnosis not present

## 2022-07-22 DIAGNOSIS — Z79891 Long term (current) use of opiate analgesic: Secondary | ICD-10-CM | POA: Diagnosis not present

## 2022-07-22 DIAGNOSIS — I69354 Hemiplegia and hemiparesis following cerebral infarction affecting left non-dominant side: Secondary | ICD-10-CM | POA: Diagnosis not present

## 2022-07-22 DIAGNOSIS — E785 Hyperlipidemia, unspecified: Secondary | ICD-10-CM | POA: Diagnosis not present

## 2022-07-22 DIAGNOSIS — Z9981 Dependence on supplemental oxygen: Secondary | ICD-10-CM | POA: Diagnosis not present

## 2022-07-22 DIAGNOSIS — M1990 Unspecified osteoarthritis, unspecified site: Secondary | ICD-10-CM | POA: Diagnosis not present

## 2022-07-22 DIAGNOSIS — I129 Hypertensive chronic kidney disease with stage 1 through stage 4 chronic kidney disease, or unspecified chronic kidney disease: Secondary | ICD-10-CM | POA: Diagnosis not present

## 2022-07-22 DIAGNOSIS — Z936 Other artificial openings of urinary tract status: Secondary | ICD-10-CM | POA: Diagnosis not present

## 2022-07-22 DIAGNOSIS — M545 Low back pain, unspecified: Secondary | ICD-10-CM | POA: Diagnosis not present

## 2022-07-22 DIAGNOSIS — E876 Hypokalemia: Secondary | ICD-10-CM | POA: Diagnosis not present

## 2022-07-22 DIAGNOSIS — G35 Multiple sclerosis: Secondary | ICD-10-CM | POA: Diagnosis not present

## 2022-07-22 DIAGNOSIS — I739 Peripheral vascular disease, unspecified: Secondary | ICD-10-CM | POA: Diagnosis not present

## 2022-07-22 DIAGNOSIS — Z9350 Unspecified cystostomy status: Secondary | ICD-10-CM | POA: Diagnosis not present

## 2022-07-22 DIAGNOSIS — Z8701 Personal history of pneumonia (recurrent): Secondary | ICD-10-CM | POA: Diagnosis not present

## 2022-07-22 DIAGNOSIS — R1310 Dysphagia, unspecified: Secondary | ICD-10-CM | POA: Diagnosis not present

## 2022-07-22 DIAGNOSIS — N189 Chronic kidney disease, unspecified: Secondary | ICD-10-CM | POA: Diagnosis not present

## 2022-07-24 DIAGNOSIS — I69354 Hemiplegia and hemiparesis following cerebral infarction affecting left non-dominant side: Secondary | ICD-10-CM | POA: Diagnosis not present

## 2022-07-24 DIAGNOSIS — I739 Peripheral vascular disease, unspecified: Secondary | ICD-10-CM | POA: Diagnosis not present

## 2022-07-24 DIAGNOSIS — N189 Chronic kidney disease, unspecified: Secondary | ICD-10-CM | POA: Diagnosis not present

## 2022-07-24 DIAGNOSIS — I129 Hypertensive chronic kidney disease with stage 1 through stage 4 chronic kidney disease, or unspecified chronic kidney disease: Secondary | ICD-10-CM | POA: Diagnosis not present

## 2022-07-24 DIAGNOSIS — G35 Multiple sclerosis: Secondary | ICD-10-CM | POA: Diagnosis not present

## 2022-07-24 DIAGNOSIS — I2510 Atherosclerotic heart disease of native coronary artery without angina pectoris: Secondary | ICD-10-CM | POA: Diagnosis not present

## 2022-07-28 ENCOUNTER — Ambulatory Visit (HOSPITAL_COMMUNITY)
Admission: RE | Admit: 2022-07-28 | Discharge: 2022-07-28 | Disposition: A | Discharge status: Home / Self Care | Payer: Medicare Other | Source: Ambulatory Visit | Attending: Interventional Radiology | Admitting: Interventional Radiology

## 2022-07-28 ENCOUNTER — Other Ambulatory Visit (HOSPITAL_COMMUNITY): Payer: Self-pay | Admitting: Interventional Radiology

## 2022-07-28 DIAGNOSIS — N319 Neuromuscular dysfunction of bladder, unspecified: Secondary | ICD-10-CM | POA: Insufficient documentation

## 2022-07-28 DIAGNOSIS — Z436 Encounter for attention to other artificial openings of urinary tract: Secondary | ICD-10-CM | POA: Insufficient documentation

## 2022-07-28 DIAGNOSIS — Z466 Encounter for fitting and adjustment of urinary device: Secondary | ICD-10-CM | POA: Diagnosis not present

## 2022-07-28 HISTORY — PX: IR NEPHROSTOMY EXCHANGE LEFT: IMG6069

## 2022-07-28 MED ORDER — LIDOCAINE HCL 1 % IJ SOLN
INTRAMUSCULAR | Status: AC
  Filled 2022-07-28: qty 20

## 2022-07-28 MED ORDER — IOHEXOL 300 MG/ML  SOLN
50.0000 mL | Freq: Once | INTRAMUSCULAR | Status: AC | PRN
  Administered 2022-07-28: 10 mL

## 2022-07-28 NOTE — Procedures (Signed)
Interventional Radiology Procedure Note  Procedure:   Image guided left PCN change, new 3F drain to gravity  Complications: None Recommendations:  - To gravity - Do not submerge - routine change in 8 weeks  Signed,  Haylee Mcanany S. Earleen Newport, DO

## 2022-08-08 DIAGNOSIS — G35 Multiple sclerosis: Secondary | ICD-10-CM | POA: Diagnosis not present

## 2022-08-08 DIAGNOSIS — I739 Peripheral vascular disease, unspecified: Secondary | ICD-10-CM | POA: Diagnosis not present

## 2022-08-08 DIAGNOSIS — N189 Chronic kidney disease, unspecified: Secondary | ICD-10-CM | POA: Diagnosis not present

## 2022-08-08 DIAGNOSIS — I129 Hypertensive chronic kidney disease with stage 1 through stage 4 chronic kidney disease, or unspecified chronic kidney disease: Secondary | ICD-10-CM | POA: Diagnosis not present

## 2022-08-08 DIAGNOSIS — I69354 Hemiplegia and hemiparesis following cerebral infarction affecting left non-dominant side: Secondary | ICD-10-CM | POA: Diagnosis not present

## 2022-08-08 DIAGNOSIS — I2510 Atherosclerotic heart disease of native coronary artery without angina pectoris: Secondary | ICD-10-CM | POA: Diagnosis not present

## 2022-08-13 ENCOUNTER — Ambulatory Visit (INDEPENDENT_AMBULATORY_CARE_PROVIDER_SITE_OTHER): Payer: Medicare Other | Admitting: Physician Assistant

## 2022-08-13 DIAGNOSIS — R339 Retention of urine, unspecified: Secondary | ICD-10-CM

## 2022-08-13 NOTE — Progress Notes (Signed)
Suprapubic Cath Change  Patient is present today for a suprapubic catheter change due to urinary retention.  74m of water was drained from the balloon, a 24FR foley cath was removed from the tract with out difficulty.  Site was cleaned and prepped in a sterile fashion with betadine.  A 24FR foley cath was replaced into the tract no complications were noted. Catheter was flushed with 358mof sterile water with [place].  10 ml of sterile water was inflated into the balloon and a leg bag was attached for drainage.  Patient tolerated well. A night bag was given to patient and proper instruction was given on how to switch bags.    Performed by: KouLevi AlandMA  Follow up: Follow up as scheduled.    Procedure reviewed. Agree with clinical documentation.  Julienne A Summerlin PA-C

## 2022-08-24 ENCOUNTER — Other Ambulatory Visit: Payer: Self-pay

## 2022-08-24 ENCOUNTER — Encounter (HOSPITAL_COMMUNITY): Payer: Self-pay

## 2022-08-24 ENCOUNTER — Inpatient Hospital Stay (HOSPITAL_COMMUNITY)
Admission: EM | Admit: 2022-08-24 | Discharge: 2022-08-30 | DRG: 699 | Disposition: A | Discharge status: Skilled Nursing Facility | Payer: Medicare Other | Attending: Internal Medicine | Admitting: Internal Medicine

## 2022-08-24 ENCOUNTER — Emergency Department (HOSPITAL_COMMUNITY): Payer: Medicare Other

## 2022-08-24 DIAGNOSIS — R4182 Altered mental status, unspecified: Secondary | ICD-10-CM | POA: Diagnosis not present

## 2022-08-24 DIAGNOSIS — I6381 Other cerebral infarction due to occlusion or stenosis of small artery: Secondary | ICD-10-CM | POA: Diagnosis not present

## 2022-08-24 DIAGNOSIS — R2689 Other abnormalities of gait and mobility: Secondary | ICD-10-CM | POA: Diagnosis not present

## 2022-08-24 DIAGNOSIS — N12 Tubulo-interstitial nephritis, not specified as acute or chronic: Secondary | ICD-10-CM | POA: Diagnosis present

## 2022-08-24 DIAGNOSIS — N183 Chronic kidney disease, stage 3 unspecified: Secondary | ICD-10-CM | POA: Diagnosis not present

## 2022-08-24 DIAGNOSIS — G35 Multiple sclerosis: Secondary | ICD-10-CM | POA: Diagnosis not present

## 2022-08-24 DIAGNOSIS — Z823 Family history of stroke: Secondary | ICD-10-CM

## 2022-08-24 DIAGNOSIS — Z87891 Personal history of nicotine dependence: Secondary | ICD-10-CM | POA: Diagnosis not present

## 2022-08-24 DIAGNOSIS — G4700 Insomnia, unspecified: Secondary | ICD-10-CM | POA: Diagnosis present

## 2022-08-24 DIAGNOSIS — N319 Neuromuscular dysfunction of bladder, unspecified: Secondary | ICD-10-CM | POA: Diagnosis present

## 2022-08-24 DIAGNOSIS — M1990 Unspecified osteoarthritis, unspecified site: Secondary | ICD-10-CM | POA: Diagnosis not present

## 2022-08-24 DIAGNOSIS — R41841 Cognitive communication deficit: Secondary | ICD-10-CM | POA: Diagnosis not present

## 2022-08-24 DIAGNOSIS — R000 Tachycardia, unspecified: Secondary | ICD-10-CM | POA: Diagnosis not present

## 2022-08-24 DIAGNOSIS — T83510A Infection and inflammatory reaction due to cystostomy catheter, initial encounter: Principal | ICD-10-CM | POA: Diagnosis present

## 2022-08-24 DIAGNOSIS — E785 Hyperlipidemia, unspecified: Secondary | ICD-10-CM | POA: Diagnosis present

## 2022-08-24 DIAGNOSIS — Z9842 Cataract extraction status, left eye: Secondary | ICD-10-CM

## 2022-08-24 DIAGNOSIS — Z1152 Encounter for screening for COVID-19: Secondary | ICD-10-CM

## 2022-08-24 DIAGNOSIS — N1832 Chronic kidney disease, stage 3b: Secondary | ICD-10-CM | POA: Diagnosis not present

## 2022-08-24 DIAGNOSIS — I69354 Hemiplegia and hemiparesis following cerebral infarction affecting left non-dominant side: Secondary | ICD-10-CM | POA: Diagnosis not present

## 2022-08-24 DIAGNOSIS — A419 Sepsis, unspecified organism: Secondary | ICD-10-CM | POA: Diagnosis not present

## 2022-08-24 DIAGNOSIS — Z9841 Cataract extraction status, right eye: Secondary | ICD-10-CM

## 2022-08-24 DIAGNOSIS — R410 Disorientation, unspecified: Secondary | ICD-10-CM | POA: Diagnosis not present

## 2022-08-24 DIAGNOSIS — N390 Urinary tract infection, site not specified: Secondary | ICD-10-CM | POA: Diagnosis present

## 2022-08-24 DIAGNOSIS — M549 Dorsalgia, unspecified: Secondary | ICD-10-CM | POA: Diagnosis present

## 2022-08-24 DIAGNOSIS — E860 Dehydration: Secondary | ICD-10-CM | POA: Diagnosis present

## 2022-08-24 DIAGNOSIS — B952 Enterococcus as the cause of diseases classified elsewhere: Secondary | ICD-10-CM | POA: Diagnosis present

## 2022-08-24 DIAGNOSIS — N281 Cyst of kidney, acquired: Secondary | ICD-10-CM | POA: Diagnosis not present

## 2022-08-24 DIAGNOSIS — E039 Hypothyroidism, unspecified: Secondary | ICD-10-CM | POA: Diagnosis not present

## 2022-08-24 DIAGNOSIS — Z961 Presence of intraocular lens: Secondary | ICD-10-CM | POA: Diagnosis present

## 2022-08-24 DIAGNOSIS — I739 Peripheral vascular disease, unspecified: Secondary | ICD-10-CM | POA: Diagnosis present

## 2022-08-24 DIAGNOSIS — Z936 Other artificial openings of urinary tract status: Secondary | ICD-10-CM | POA: Diagnosis not present

## 2022-08-24 DIAGNOSIS — J984 Other disorders of lung: Secondary | ICD-10-CM | POA: Diagnosis not present

## 2022-08-24 DIAGNOSIS — Z8249 Family history of ischemic heart disease and other diseases of the circulatory system: Secondary | ICD-10-CM

## 2022-08-24 DIAGNOSIS — N189 Chronic kidney disease, unspecified: Secondary | ICD-10-CM

## 2022-08-24 DIAGNOSIS — Y846 Urinary catheterization as the cause of abnormal reaction of the patient, or of later complication, without mention of misadventure at the time of the procedure: Secondary | ICD-10-CM | POA: Diagnosis present

## 2022-08-24 DIAGNOSIS — I2510 Atherosclerotic heart disease of native coronary artery without angina pectoris: Secondary | ICD-10-CM | POA: Diagnosis present

## 2022-08-24 DIAGNOSIS — Z9359 Other cystostomy status: Secondary | ICD-10-CM | POA: Diagnosis not present

## 2022-08-24 DIAGNOSIS — N200 Calculus of kidney: Secondary | ICD-10-CM | POA: Diagnosis not present

## 2022-08-24 DIAGNOSIS — R918 Other nonspecific abnormal finding of lung field: Secondary | ICD-10-CM | POA: Diagnosis not present

## 2022-08-24 DIAGNOSIS — M6281 Muscle weakness (generalized): Secondary | ICD-10-CM | POA: Diagnosis not present

## 2022-08-24 DIAGNOSIS — G4733 Obstructive sleep apnea (adult) (pediatric): Secondary | ICD-10-CM | POA: Diagnosis present

## 2022-08-24 DIAGNOSIS — R1312 Dysphagia, oropharyngeal phase: Secondary | ICD-10-CM | POA: Diagnosis not present

## 2022-08-24 DIAGNOSIS — I69952 Hemiplegia and hemiparesis following unspecified cerebrovascular disease affecting left dominant side: Secondary | ICD-10-CM | POA: Diagnosis not present

## 2022-08-24 DIAGNOSIS — E876 Hypokalemia: Secondary | ICD-10-CM | POA: Diagnosis present

## 2022-08-24 DIAGNOSIS — N179 Acute kidney failure, unspecified: Secondary | ICD-10-CM | POA: Diagnosis present

## 2022-08-24 DIAGNOSIS — Z888 Allergy status to other drugs, medicaments and biological substances status: Secondary | ICD-10-CM

## 2022-08-24 DIAGNOSIS — G8929 Other chronic pain: Secondary | ICD-10-CM | POA: Diagnosis present

## 2022-08-24 DIAGNOSIS — Z79899 Other long term (current) drug therapy: Secondary | ICD-10-CM

## 2022-08-24 DIAGNOSIS — Z79890 Hormone replacement therapy (postmenopausal): Secondary | ICD-10-CM

## 2022-08-24 DIAGNOSIS — H5789 Other specified disorders of eye and adnexa: Secondary | ICD-10-CM | POA: Diagnosis present

## 2022-08-24 DIAGNOSIS — R1310 Dysphagia, unspecified: Secondary | ICD-10-CM | POA: Diagnosis not present

## 2022-08-24 DIAGNOSIS — I129 Hypertensive chronic kidney disease with stage 1 through stage 4 chronic kidney disease, or unspecified chronic kidney disease: Secondary | ICD-10-CM | POA: Diagnosis present

## 2022-08-24 DIAGNOSIS — I10 Essential (primary) hypertension: Secondary | ICD-10-CM | POA: Diagnosis not present

## 2022-08-24 LAB — COMPREHENSIVE METABOLIC PANEL
ALT: 10 U/L (ref 0–44)
AST: 17 U/L (ref 15–41)
Albumin: 3.9 g/dL (ref 3.5–5.0)
Alkaline Phosphatase: 121 U/L (ref 38–126)
Anion gap: 8 (ref 5–15)
BUN: 31 mg/dL — ABNORMAL HIGH (ref 8–23)
CO2: 29 mmol/L (ref 22–32)
Calcium: 8.7 mg/dL — ABNORMAL LOW (ref 8.9–10.3)
Chloride: 108 mmol/L (ref 98–111)
Creatinine, Ser: 2.02 mg/dL — ABNORMAL HIGH (ref 0.61–1.24)
GFR, Estimated: 33 mL/min — ABNORMAL LOW (ref >60)
Glucose, Bld: 109 mg/dL — ABNORMAL HIGH (ref 70–99)
Potassium: 2.8 mmol/L — ABNORMAL LOW (ref 3.5–5.1)
Sodium: 145 mmol/L (ref 135–145)
Total Bilirubin: 0.8 mg/dL (ref 0.3–1.2)
Total Protein: 7.6 g/dL (ref 6.5–8.1)

## 2022-08-24 LAB — PROTIME-INR
INR: 1 (ref 0.8–1.2)
Prothrombin Time: 13.5 seconds (ref 11.4–15.2)

## 2022-08-24 LAB — CBC
HCT: 41.3 % (ref 39.0–52.0)
Hemoglobin: 13.7 g/dL (ref 13.0–17.0)
MCH: 30.4 pg (ref 26.0–34.0)
MCHC: 33.2 g/dL (ref 30.0–36.0)
MCV: 91.6 fL (ref 80.0–100.0)
Platelets: 169 10*3/uL (ref 150–400)
RBC: 4.51 MIL/uL (ref 4.22–5.81)
RDW: 14.2 % (ref 11.5–15.5)
WBC: 7.3 10*3/uL (ref 4.0–10.5)
nRBC: 0 % (ref 0.0–0.2)

## 2022-08-24 LAB — URINALYSIS, ROUTINE W REFLEX MICROSCOPIC
Bilirubin Urine: NEGATIVE
Glucose, UA: NEGATIVE mg/dL
Ketones, ur: NEGATIVE mg/dL
Nitrite: POSITIVE — AB
Protein, ur: 100 mg/dL — AB
Specific Gravity, Urine: 1.01 (ref 1.005–1.030)
WBC, UA: >50 WBC/hpf — ABNORMAL HIGH (ref 0–5)
pH: 7 (ref 5.0–8.0)

## 2022-08-24 LAB — LACTIC ACID, PLASMA: Lactic Acid, Venous: 1.1 mmol/L (ref 0.5–1.9)

## 2022-08-24 LAB — MAGNESIUM: Magnesium: 2.2 mg/dL (ref 1.7–2.4)

## 2022-08-24 LAB — RESP PANEL BY RT-PCR (FLU A&B, COVID) ARPGX2
Influenza A by PCR: NEGATIVE
Influenza B by PCR: NEGATIVE
SARS Coronavirus 2 by RT PCR: NEGATIVE

## 2022-08-24 LAB — TROPONIN I (HIGH SENSITIVITY)
Troponin I (High Sensitivity): 19 ng/L — ABNORMAL HIGH (ref <18)
Troponin I (High Sensitivity): 16 ng/L (ref <18)

## 2022-08-24 LAB — TSH: TSH: 1.405 u[IU]/mL (ref 0.350–4.500)

## 2022-08-24 MED ORDER — LACTATED RINGERS IV BOLUS (SEPSIS)
1000.0000 mL | Freq: Once | INTRAVENOUS | Status: AC
  Administered 2022-08-24: 1000 mL via INTRAVENOUS

## 2022-08-24 MED ORDER — ONDANSETRON HCL 4 MG PO TABS: 4.0000 mg | ORAL_TABLET | Freq: Four times a day (QID) | ORAL | Status: DC | PRN

## 2022-08-24 MED ORDER — ACETAMINOPHEN 650 MG RE SUPP: 650.0000 mg | Freq: Four times a day (QID) | RECTAL | Status: DC | PRN

## 2022-08-24 MED ORDER — DIAZEPAM 5 MG PO TABS
5.0000 mg | ORAL_TABLET | Freq: Two times a day (BID) | ORAL | Status: DC
  Administered 2022-08-24 – 2022-08-30 (×12): 5 mg via ORAL
  Filled 2022-08-24 (×12): qty 1

## 2022-08-24 MED ORDER — METOCLOPRAMIDE HCL 5 MG/ML IJ SOLN
5.0000 mg | Freq: Once | INTRAMUSCULAR | Status: AC
  Administered 2022-08-24: 5 mg via INTRAVENOUS
  Filled 2022-08-24: qty 2

## 2022-08-24 MED ORDER — AMLODIPINE BESYLATE 5 MG PO TABS
5.0000 mg | ORAL_TABLET | Freq: Every evening | ORAL | Status: DC
  Administered 2022-08-25 – 2022-08-29 (×5): 5 mg via ORAL
  Filled 2022-08-24 (×5): qty 1

## 2022-08-24 MED ORDER — ENOXAPARIN SODIUM 40 MG/0.4ML IJ SOSY
40.0000 mg | PREFILLED_SYRINGE | INTRAMUSCULAR | Status: DC
  Administered 2022-08-24: 40 mg via SUBCUTANEOUS
  Filled 2022-08-24: qty 0.4

## 2022-08-24 MED ORDER — ACETAMINOPHEN 325 MG PO TABS: 650.0000 mg | ORAL_TABLET | Freq: Four times a day (QID) | ORAL | Status: DC | PRN

## 2022-08-24 MED ORDER — SODIUM CHLORIDE 0.9 % IV SOLN
1.0000 g | INTRAVENOUS | Status: DC
  Administered 2022-08-25: 1 g via INTRAVENOUS
  Filled 2022-08-24 (×3): qty 10

## 2022-08-24 MED ORDER — POTASSIUM CHLORIDE CRYS ER 20 MEQ PO TBCR
20.0000 meq | EXTENDED_RELEASE_TABLET | Freq: Every evening | ORAL | Status: DC
  Administered 2022-08-24 – 2022-08-27 (×4): 20 meq via ORAL
  Filled 2022-08-24 (×4): qty 1

## 2022-08-24 MED ORDER — FAMOTIDINE IN NACL 20-0.9 MG/50ML-% IV SOLN
20.0000 mg | Freq: Once | INTRAVENOUS | Status: AC
  Administered 2022-08-24: 20 mg via INTRAVENOUS
  Filled 2022-08-24: qty 50

## 2022-08-24 MED ORDER — SODIUM CHLORIDE 0.9 % IV SOLN
2.0000 g | Freq: Once | INTRAVENOUS | Status: AC
  Administered 2022-08-24: 2 g via INTRAVENOUS
  Filled 2022-08-24: qty 12.5

## 2022-08-24 MED ORDER — HYDROCODONE-ACETAMINOPHEN 7.5-325 MG PO TABS
1.0000 | ORAL_TABLET | Freq: Two times a day (BID) | ORAL | Status: DC
  Administered 2022-08-24 – 2022-08-30 (×12): 1 via ORAL
  Filled 2022-08-24 (×12): qty 1

## 2022-08-24 MED ORDER — POLYETHYL GLYCOL-PROPYL GLYCOL 0.4-0.3 % OP GEL: Freq: Four times a day (QID) | OPHTHALMIC | Status: DC | PRN

## 2022-08-24 MED ORDER — POTASSIUM CHLORIDE IN NACL 40-0.9 MEQ/L-% IV SOLN: INTRAVENOUS | Status: DC

## 2022-08-24 MED ORDER — POTASSIUM CHLORIDE 20 MEQ PO PACK
40.0000 meq | PACK | Freq: Once | ORAL | Status: AC
  Administered 2022-08-24: 40 meq via ORAL
  Filled 2022-08-24: qty 2

## 2022-08-24 MED ORDER — ACETAMINOPHEN 325 MG PO TABS
650.0000 mg | ORAL_TABLET | Freq: Once | ORAL | Status: AC
  Administered 2022-08-24: 650 mg via ORAL
  Filled 2022-08-24: qty 2

## 2022-08-24 MED ORDER — POTASSIUM CHLORIDE CRYS ER 20 MEQ PO TBCR
40.0000 meq | EXTENDED_RELEASE_TABLET | Freq: Once | ORAL | Status: DC
  Filled 2022-08-24: qty 2

## 2022-08-24 MED ORDER — POTASSIUM CHLORIDE 10 MEQ/100ML IV SOLN
10.0000 meq | INTRAVENOUS | Status: AC
  Administered 2022-08-24 (×4): 10 meq via INTRAVENOUS
  Filled 2022-08-24 (×4): qty 100

## 2022-08-24 MED ORDER — ONDANSETRON HCL 4 MG/2ML IJ SOLN: 4.0000 mg | Freq: Four times a day (QID) | INTRAMUSCULAR | Status: DC | PRN

## 2022-08-24 MED ORDER — LEVOTHYROXINE SODIUM 75 MCG PO TABS
75.0000 ug | ORAL_TABLET | Freq: Every morning | ORAL | Status: DC
  Administered 2022-08-25 – 2022-08-30 (×6): 75 ug via ORAL
  Filled 2022-08-24 (×6): qty 1

## 2022-08-24 NOTE — H&P (Signed)
History and Physical    Jordan Ward:[id number] DOB: [birth year] DOA: 08/24/2022  PCP: Jordan Nose, FNP  Patient coming from: Home  I have personally briefly reviewed patient's old medical records in [place]  Chief Complaint: Dizziness, shaking  HPI: Jordan Ward is a 81 y.o. male with medical history significant of multiple sclerosis, neurogenic bladder status post suprapubic catheter, left percutaneous nephrostomy tube, reports that he was in his usual state of health when this morning he was going to the bathroom to empty his nephrostomy bag.  He became increasingly dizzy.  Wife reports that he began shaking.  Denies any recent vomiting, diarrhea, shortness of breath, fever.  Reports that he does have a chronic cough with some p.o. intake, but this has not changed recently.  He was brought to the hospital for evaluation.  Blood pressure noted to be elevated.  Initial labs show significant hypokalemia.  His creatinine is mildly trended up.  Urinalysis indicated possible infection.  He was started on antibiotics and IV fluids.   Review of Systems: As per HPI otherwise 10 point review of systems negative.    Past Medical History:  Diagnosis Date   Anemia    Arthritis    Back pain, chronic    Bilateral carotid bruits    C. difficile colitis 09/2011   CAD (coronary artery disease)    Carotid artery stenosis    Cerebrovascular disease    Cerebrovascular disease 08/17/2018   Colon polyps    Complication of cystostomy catheter, initial encounter ([place]) 04/20/2020   Dyslipidemia    Dysphagia 10/07/2011   Encephalopathy    Gait disorder    HA (headache)    High grade dysplasia in colonic adenoma 09/2005   History of kidney stones    HTN (hypertension), malignant 10/06/2011   Hypernatremia    Hypokalemia    Hypothyroidism 10/08/2011   Insomnia    Junctional rhythm    Kidney stones    MS (multiple sclerosis) (HCC)    Neuromuscular disorder (HCC)    MS   OSA (obstructive  sleep apnea)    Paroxysmal atrial tachycardia    Peripheral vascular disease ([place])    Pneumonia 4 yrs ago   Pulmonary fibrosis ([place]) 10/06/2011   Pulmonary nodule 10/08/2011   PVD (peripheral vascular disease) ([place])    Sacral ulcer (HCC)    Sleep apnea    cannot tolerate   Stroke ([place])    left sided weakness   Suprapubic catheter ([place])    TIA (transient ischemic attack)    Tremors of nervous system 10/08/2011   Urinary tract infection    Ventral hernia without obstruction or gangrene     Large 8X9cm ventral hernia with loss of domain. CT reads report as diastasis recti with herniation or diastasis recti.  Dr. Constance Haw, Surgery, reviewed CT with radiology and there is herniation with only hernia sac or peritoneum over the bowel and large separation of the rectus muslce (i.e. diastasis recti aka loss of domain).  No surgical intervention recommended given size, age, and health.     Past Surgical History:  Procedure Laterality Date   APPENDECTOMY  09/2005   at time of left hemicolectomy   BACK SURGERY  1976/1979   lower   BILIARY DILATION N/A 03/03/2020   Procedure: BILIARY DILATION;  Surgeon: Rogene Houston, MD;  [facility];  Service: Endoscopy;  Laterality: N/A;   CATARACT EXTRACTION W/PHACO Right 03/08/2018   Procedure: CATARACT EXTRACTION PHACO AND INTRAOCULAR  LENS PLACEMENT RIGHT EYE;  Surgeon: Tonny Branch, MD;  Location: [facility];  Service: Ophthalmology;  Laterality: Right;  CDE: 8.86   CATARACT EXTRACTION W/PHACO Left 04/05/2018   Procedure: CATARACT EXTRACTION PHACO AND INTRAOCULAR LENS PLACEMENT (IOC);  Surgeon: Tonny Branch, MD;  Location: [facility];  Service: Ophthalmology;  Laterality: Left;  CDE: 7.36   CENTRAL LINE INSERTION-TUNNELED Right 09/11/2020   Procedure: PLACEMENT OF TUNNELED CENTRAL LINE INTO JUGULAR VEIN;  Surgeon: Virl Cagey, MD;  Location: [facility];  Service: General;  Laterality: Right;   CHOLECYSTECTOMY     Dr. Tamala Julian   COLON SURGERY  09/2005    [place]man: four tubular adenomas, large adenomatous polyp with HIGH GRADE dysplasia   COLONOSCOPY  11/2004   Dr. Sharol Roussel sessile polyp splenic flexure, 47m sessile polyp desc colon, tubulovillous adenoma (bx not removed)   COLONOSCOPY  01/2005   poor prep, polyp could not be found   COLONOSCOPY  05/2005   with EMR, polypectomy Dr. JOlegario Messier bx showed high grade dysplasia, partially resected   COLONOSCOPY  09/2005   Dr. GArsenio Loader [employer] tattooing, four villous colon polyp (3 had been missed on previous colonoscopies due to limitations of procedures   COLONOSCOPY  09/2006   normal TI, no polyps   COLONOSCOPY  10/2007   Dr. RImogene Burndistal mammillations, benign bx, normal TI, random bx neg for microscopic colitis   CYSTOSCOPY W/ URETERAL STENT PLACEMENT Left 09/05/2020   Procedure: CYSTOSCOPY WITH RETROGRADE PYELOGRAM/URETERAL STENT PLACEMENT;  Surgeon: HArdis Hughs MD;  Location: [facility];  Service: Urology;  Laterality: Left;   CYSTOSCOPY WITH LITHOLAPAXY N/A 07/27/2018   Procedure: CYSTOSCOPY WITH LITHOLAPAXY VIA  SUPRAPUBIC TUBE;  Surgeon: DFranchot Gallo MD;  Location: [facility];  Service: Urology;  Laterality: N/A;   CYSTOSCOPY WITH RETROGRADE PYELOGRAM, URETEROSCOPY AND STENT PLACEMENT Left 06/09/2017   Procedure: CYSTOSCOPY WITH LEFT RETROGRADE PYELOGRAM, LEFT URETEROSCOPY, LEFT URETEROSCOPIC STONE EXTRACTION, LEFT URETERAL STENT PLACEMENT;  Surgeon: DFranchot Gallo MD;  Location: [facility];  Service: Urology;  Laterality: Left;   CYSTOSCOPY/URETEROSCOPY/HOLMIUM LASER/STENT PLACEMENT Left 05/22/2020   Procedure: CYSTOSCOPY/URETEROSCOPY/STENT PLACEMENT;  Surgeon: DFranchot Gallo MD;  Location: [facility];  Service: Urology;  Laterality: Left;   ERCP N/A 03/03/2020   Procedure: ENDOSCOPIC RETROGRADE CHOLANGIOPANCREATOGRAPHY (ERCP);  Surgeon: RRogene Houston MD;  [facility];  Service: Endoscopy;  Laterality: N/A;   ERCP N/A 04/20/2020   Procedure: ENDOSCOPIC  RETROGRADE CHOLANGIOPANCREATOGRAPHY (ERCP);  Surgeon: RRogene Houston MD;  [facility];  Service: Endoscopy;  Laterality: N/A;  to be done at 7:30am in [place] 04/20/2020   Procedure: STENT REMOVAL;  Surgeon: RRogene Houston MD;  [facility];  Service: Endoscopy;  Laterality: N/A;   INGUINAL HERNIA REPAIR  1971   bilateral   INSERTION OF SUPRAPUBIC CATHETER  06/09/2017   Procedure: EXCHANGE OF SUPRAPUBIC CATHETER;  Surgeon: DFranchot Gallo MD;  Location: [facility];  Service: Urology;;   INSERTION OF SUPRAPUBIC CATHETER  05/22/2020   Procedure: SUPRAPUBIC CATHETER EXCHANGE;  Surgeon: DFranchot Gallo MD;  Location: [facility];  Service: Urology;;   IR NEPHROSTOMY EXCHANGE LEFT  03/25/2021   IR NEPHROSTOMY EXCHANGE LEFT  05/20/2021   IR NEPHROSTOMY EXCHANGE LEFT  07/13/2021   IR NEPHROSTOMY EXCHANGE LEFT  09/09/2021   IR NEPHROSTOMY EXCHANGE LEFT  11/04/2021   IR NEPHROSTOMY EXCHANGE LEFT  12/23/2021   IR NEPHROSTOMY EXCHANGE LEFT  02/17/2022   IR NEPHROSTOMY EXCHANGE LEFT  04/14/2022  IR NEPHROSTOMY EXCHANGE LEFT  06/09/2022   IR NEPHROSTOMY EXCHANGE LEFT  07/28/2022   IR NEPHROSTOMY PLACEMENT LEFT  05/26/2017   IR NEPHROSTOMY PLACEMENT LEFT  05/02/2020   IR NEPHROSTOMY PLACEMENT LEFT  02/15/2021   IR URETERAL STENT PERC REMOVAL MOD SED  03/25/2021   KIDNEY STONE SURGERY  09/13/2015   LITHOTRIPSY N/A 03/03/2020   Procedure: MECHANICAL LITHOTRIPSY WITH REMOVAL OF MULTIPLE STONE FRAGMENTS;  Surgeon: Rogene Houston, MD;  [facility];  Service: Endoscopy;  Laterality: N/A;   NEPHROLITHOTOMY Left 09/13/2015   Procedure: LEFT PERCUTANEOUS NEPHROLITHOTOMY ;  Surgeon: Franchot Gallo, MD;  Location: [facility];  Service: Urology;  Laterality: Left;   NEPHROSTOMY TUBE REMOVAL  05/22/2020   Procedure: NEPHROSTOMY TUBE REMOVAL;  Surgeon: Franchot Gallo, MD;  Location: [facility];  Service: Urology;;   [facility] REMOVAL Right 09/20/2020    Procedure: MINOR REMOVAL CENTRAL LINE;  Surgeon: Virl Cagey, MD;  Location: [facility];  Service: General;  Laterality: Right;  Pt to arrive at 7:30am for procedure   REMOVAL OF STONES N/A 04/20/2020   Procedure: REMOVAL OF STONES;  Surgeon: Rogene Houston, MD;  [facility];  Service: Endoscopy;  Laterality: N/A;   SPHINCTEROTOMY  03/03/2020   Procedure: BILLARY SPHINCTEROTOMY;  Surgeon: Rogene Houston, MD;  [facility];  Service: Endoscopy;;   SUPRAPUBIC CATHETER INSERTION      Social History:  reports that he quit smoking about 34 years ago. His smoking use included cigarettes. He has a 25.00 pack-year smoking history. He has never used smokeless tobacco. He reports that he does not drink alcohol and does not use drugs.  Allergies  Allergen Reactions   Tetracyclines & Related Anaphylaxis and Rash   Cipro [Ciprofloxacin Hcl] Other (See Comments)    "[place] [address] out in the floor"    Family History  Problem Relation Age of Onset   Cirrhosis Brother        etoh   Stroke Mother 66   Coronary artery disease Father 8   Heart attack Brother    Cancer Sister    Multiple sclerosis Other    Colon cancer Neg Hx      Prior to Admission medications   Medication Sig Start Date End Date Taking? Authorizing Provider  acetaminophen (TYLENOL) 325 MG tablet Take 650 mg by mouth every 6 (six) hours as needed for mild pain, moderate pain, fever or headache.   Yes [provider]  amLODipine (NORVASC) 10 MG tablet Take 5 mg by mouth every evening. After supper. 05/21/21  Yes [provider]  Carboxymethylcellulose Sodium (DRY EYE RELIEF OP) Place 1 drop into both eyes 4 (four) times daily as needed (dry eyes).   Yes [provider]  diazepam (VALIUM) 5 MG tablet Take 5 mg by mouth 2 (two) times daily.   Yes [provider]  HYDROcodone-acetaminophen (NORCO) 7.5-325 MG per tablet Take 1 tablet by mouth 2 (two) times daily. Max APAP 3 GM IN 24  HOURS FROM ALL SOURCES Patient taking differently: Take 1 tablet by mouth at bedtime. 08/22/14  Yes Blanchie Serve, MD  levothyroxine (SYNTHROID) 75 MCG tablet Take 75 mcg by mouth in the morning. 07/02/21  Yes [provider]  loperamide (IMODIUM A-D) 2 MG tablet Take 1 tablet (2 mg total) by mouth 4 (four) times daily as needed for diarrhea or loose stools. 05/22/22  Yes Shah, Pratik D, DO  potassium chloride SA (KLOR-CON) 20 MEQ tablet Take 20 mEq  by mouth every evening. After supper 07/02/21  Yes [provider]  levofloxacin (LEVAQUIN) 500 MG tablet Take 1 as directed Patient not taking: Reported on 08/24/2022 04/15/22   Franchot Gallo, MD  OXYGEN Inhale 1 L into the lungs every evening.    [provider]    Physical Exam: Vitals:   08/24/22 1656 08/24/22 1700 08/24/22 1730 08/24/22 1812  BP:  (!) 164/90 (!) 162/72 (!) 166/79  Pulse:  (!) 58 (!) 59 60  Resp:  '19 15 17  '$ Temp: 97.9 F (36.6 C)   98.7 F (37.1 C)  TempSrc: Oral   Oral  SpO2:  100% 98% 100%  Weight:      Height:        Constitutional: NAD, calm, comfortable Eyes: PERRL, lids and conjunctivae normal ENMT: Mucous membranes are moist. Posterior pharynx clear of any exudate or lesions.Normal dentition.  Neck: normal, supple, no masses, no thyromegaly Respiratory: clear to auscultation bilaterally, no wheezing, no crackles. Normal respiratory effort. No accessory muscle use.  Cardiovascular: Regular rate and rhythm, no murmurs / rubs / gallops. No extremity edema. 2+ pedal pulses. No carotid bruits.  Abdomen: no tenderness, no masses palpated. No hepatosplenomegaly. Bowel sounds positive.  Left nephrostomy bag, recently emptied.  The suprapubic catheter bag also recently empty Musculoskeletal: no clubbing / cyanosis. No joint deformity upper and lower extremities. Good ROM, no contractures. Normal muscle tone.  Skin: no rashes, lesions, ulcers. No induration Neurologic: CN 2-12 grossly  intact. Sensation intact, DTR normal. Strength 5/5 in all 4.  Psychiatric: Normal judgment and insight. Alert and oriented x 3. Normal mood.    Labs on Admission: I have personally reviewed following labs and imaging studies  CBC: Recent Labs  Lab 08/24/22 1116  WBC 7.3  HGB 13.7  HCT 41.3  MCV 91.6  PLT 678   Basic Metabolic Panel: Recent Labs  Lab 08/24/22 1116 08/24/22 1601  NA 145  --   K 2.8*  --   CL 108  --   CO2 29  --   GLUCOSE 109*  --   BUN 31*  --   CREATININE 2.02*  --   CALCIUM 8.7*  --   MG  --  2.2   GFR: Estimated Creatinine Clearance: 21.9 mL/min (A) (by C-G formula based on SCr of 2.02 mg/dL (H)). Liver Function Tests: Recent Labs  Lab 08/24/22 1116  AST 17  ALT 10  ALKPHOS 121  BILITOT 0.8  PROT 7.6  ALBUMIN 3.9   No results for input(s): "LIPASE", "AMYLASE" in the last 168 hours. No results for input(s): "AMMONIA" in the last 168 hours. Coagulation Profile: Recent Labs  Lab 08/24/22 1116  INR 1.0   Cardiac Enzymes: No results for input(s): "CKTOTAL", "CKMB", "CKMBINDEX", "TROPONINI" in the last 168 hours. BNP (last 3 results) No results for input(s): "PROBNP" in the last 8760 hours. HbA1C: No results for input(s): "HGBA1C" in the last 72 hours. CBG: No results for input(s): "GLUCAP" in the last 168 hours. Lipid Profile: No results for input(s): "CHOL", "HDL", "LDLCALC", "TRIG", "CHOLHDL", "LDLDIRECT" in the last 72 hours. Thyroid Function Tests: Recent Labs    08/24/22 1132  TSH 1.405   Anemia Panel: No results for input(s): "VITAMINB12", "FOLATE", "FERRITIN", "TIBC", "IRON", "RETICCTPCT" in the last 72 hours. Urine analysis:    Component Value Date/Time   COLORURINE YELLOW 08/24/2022 1122   APPEARANCEUR CLOUDY (A) 08/24/2022 1122   LABSPEC 1.010 08/24/2022 1122   PHURINE 7.0 08/24/2022 1122  GLUCOSEU NEGATIVE 08/24/2022 1122   HGBUR MODERATE (A) 08/24/2022 1122   BILIRUBINUR NEGATIVE 08/24/2022 1122   BILIRUBINUR  small (A) 10/26/2021 1538   KETONESUR NEGATIVE 08/24/2022 1122   PROTEINUR 100 (A) 08/24/2022 1122   UROBILINOGEN 0.2 10/26/2021 1538   UROBILINOGEN 0.2 07/17/2014 1215   NITRITE POSITIVE (A) 08/24/2022 1122   LEUKOCYTESUR LARGE (A) 08/24/2022 1122    Radiological Exams on Admission: DG Chest Port 1 View  Result Date: 08/24/2022 CLINICAL DATA:  Questionable sepsis EXAM: PORTABLE CHEST 1 VIEW COMPARISON:  Chest x-ray dated May 26, 2022 FINDINGS: Patient rotation to the right somewhat limits evaluation. Cardiac and mediastinal contours are unchanged. Coarse interstitial opacities, unchanged when compared with prior exam and likely due to underlying chronic lung disease. No evidence of acute airspace opacity. No large pleural effusion or pneumothorax IMPRESSION: Background chronic lung disease with no evidence of acute airspace opacity. Electronically Signed   By: Yetta Glassman M.D.   On: 08/24/2022 12:19   CT ABDOMEN PELVIS WO CONTRAST  Result Date: 08/24/2022 CLINICAL DATA:  Sepsis. EXAM: CT ABDOMEN AND PELVIS WITHOUT CONTRAST TECHNIQUE: Multidetector CT imaging of the abdomen and pelvis was performed following the standard protocol without IV contrast. RADIATION DOSE REDUCTION: This exam was performed according to the departmental dose-optimization program which includes automated exposure control, adjustment of the mA and/or kV according to patient size and/or use of iterative reconstruction technique. COMPARISON:  CT 10/10/2021 FINDINGS: Lower chest: No pleural fluid or airspace disease. Signs of chronic interstitial lung disease identified with bilateral lower lobe interstitial reticulation and subpleural honeycombing. Hepatobiliary: No suspicious liver lesion. Status post cholecystectomy. Unchanged increase caliber of the CBD which measures up to 1.1 cm. Pneumobilia is identified compatible with biliary patency. Pancreas: Unremarkable. No pancreatic ductal dilatation or surrounding  inflammatory changes. Spleen: Normal in size without focal abnormality. Adrenals/Urinary Tract: Normal adrenal glands. Asymmetric right renal atrophy. Right renal calculi are again noted. The largest is in the inferior pole measuring 5 mm. No right-sided hydronephrosis. Stones within the left renal collecting system are again noted. The largest is in the inferior pole measuring 4 mm, image 28/3. There is a percutaneous nephrostomy tube in place. There is pelvocaliectasis which appears mildly increased from previous exam. AP diameter of the extrarenal pelvis measures 1.8 cm. There is no sign of hydroureter distal to the left UPJ. Bilateral kidney cysts are again seen. No follow-up imaging recommended. The urinary bladder is partially decompressed around a suprapubic catheter. There is a small amount of air within the bladder lumen which may reflect instrumentation. Stomach/Bowel: Stomach appears nondistended. No bowel Life thickening, inflammation, or distension. Signs of previous colonic resection with anastomotic suture chain identified at the level of the sigmoid colon. Previous appendectomy. Moderate stool burden identified within the colon and rectum. Vascular/Lymphatic: Extensive aortic atherosclerosis with branch vessel involvement. No signs of abdominopelvic adenopathy. Reproductive: Prostate gland enlargement. Other: No free fluid or fluid collections. Midline anterior abdominal Dias laxity with ventral herniation of nonobstructed loops of small bowel. Unchanged in appearance from previous exam. No signs of pneumoperitoneum. Musculoskeletal: Bones appear diffusely osteopenic. No acute or suspicious osseous findings. Increased sclerosis within the right femoral head appears similar to the prior exam and may reflect early avascular necrosis. IMPRESSION: 1. No acute findings identified within the abdomen or pelvis. 2. Left percutaneous nephrostomy tube in place with mild increase in pelvocaliectasis. 3.  Bilateral nephrolithiasis. 4. Prostate gland enlargement. 5. Signs of chronic interstitial lung disease. 6. Increased sclerosis within the right  femoral head appears similar to the prior exam and may reflect early avascular necrosis. 7.  Aortic Atherosclerosis (ICD10-I70.0). Electronically Signed   By: Kerby Moors M.D.   On: 08/24/2022 12:13   CT Head Wo Contrast  Result Date: 08/24/2022 CLINICAL DATA:  81 year old male with altered mental status, foul smelling urine. Atrial fibrillation. EXAM: CT HEAD WITHOUT CONTRAST TECHNIQUE: Contiguous axial images were obtained from the base of the skull through the vertex without intravenous contrast. RADIATION DOSE REDUCTION: This exam was performed according to the departmental dose-optimization program which includes automated exposure control, adjustment of the mA and/or kV according to patient size and/or use of iterative reconstruction technique. COMPARISON:  Brain MRI 10/03/2011.  Head CT 02/13/2021. FINDINGS: Brain: Stable cerebral volume. Pronounced bilateral cerebral white matter disease, chronic lacunar infarcts of the right corona radiata, right greater than left deep gray nuclei. Some ex vacuo ventricular enlargement. No midline shift, mass effect, or evidence of intracranial mass lesion. No acute intracranial hemorrhage identified. No cortically based acute infarct identified. Gray-white differentiation appears stable from last year. Vascular: Extensive Calcified atherosclerosis at the skull base. No suspicious intracranial vascular hyperdensity. Skull: No acute osseous abnormality identified. Sinuses/Orbits: Visualized paranasal sinuses and mastoids are clear. Other: No acute orbit or scalp soft tissue finding. IMPRESSION: No acute intracranial abnormality. Stable since last year non contrast CT appearance of advanced small vessel disease. Electronically Signed   By: Genevie Ann M.D.   On: 08/24/2022 12:09    EKG: Independently reviewed.  Sinus rhythm  without any acute changes from prior tracings  Assessment/Plan Principal Problem:   UTI (urinary tract infection) Active Problems:   Multiple sclerosis (HCC)   Chronic suprapubic catheter (HCC)   Dysphagia   Hypothyroidism   Essential hypertension   CKD (chronic kidney disease), stage III (HCC)   Acute kidney injury superimposed on CKD (HCC)     UTI Chronic suprapubic catheter Chronic Left percutaneous nephrostomy -Urine culture has been sent on admission -Started on cefepime  Dehydration -Continue IV fluids  Hypokalemia -Replace -Magnesium 2.2  Acute kidney injury on chronic kidney disease stage IIIb -Baseline creatinine approximately 1.5 -Admission creatinine 2.0 -No signs of obstruction on imaging -Continue IV hydration  Multiple sclerosis -He is not on any disease modifying agents -Continue supportive management with pain management and benzodiazepines -Follow-up with [place] [facility]  Dizziness/shaking -CT head unremarkable -Suspect this is related to underlying infection  Hypothyroidism -Continue Synthroid  Dysphagia -Reports that he normally has to have his food chopped up, but does tolerate thin liquids.  Needs to have large pills taken with applesauce -Will start on dysphagia 2 diet  Hypertension -Continue home dose of Norvasc  Generalized weakness -Normally ambulates with a walker -PT/OT    DVT prophylaxis: lovenox  Code Status: full code  Family Communication: updated wife over the phone  Disposition Plan: discharge home when improved  Consults called:   Admission status: telemetry, observation   Kathie Dike MD [facility]   If 7PM-7AM, please contact night-coverage [email or web address]   08/24/2022, 7:01 PM

## 2022-08-24 NOTE — Progress Notes (Signed)
Pharmacy Antibiotic Note  Jordan Ward is a 81 y.o. male admitted on 08/24/2022 with UTI.  Pharmacy has been consulted for cefepime dosing.  Plan: Cefepime 1g Q24h to start 11/27 '@1400'$   Height: '5\' 5"'$  (165.1 cm) Weight: 52.2 kg (115 lb) IBW/kg (Calculated) : 61.5  Temp (24hrs), Avg:98.3 F (36.8 C), Min:97.9 F (36.6 C), Max:98.7 F (37.1 C)  Recent Labs  Lab 08/24/22 1116 08/24/22 1203  WBC 7.3  --   CREATININE 2.02*  --   LATICACIDVEN  --  1.1    Estimated Creatinine Clearance: 21.9 mL/min (A) (by C-G formula based on SCr of 2.02 mg/dL (H)).    Allergies  Allergen Reactions   Tetracyclines & Related Anaphylaxis and Rash   Cipro [Ciprofloxacin Hcl] Other (See Comments)    "[place] [address] out in the floor"    Antimicrobials this admission: Cefepime 2g IV x1 (11/26)  Dose adjustments this admission: Cefepime 1g Q24h based on CrCl 21.9  Microbiology results: 11/26 BCx: pending 11/26 UCx: pending   Thank you for allowing pharmacy to be a part of this patient's care.  Yolanda Bonine, PharmD, MHSA, BCPPS 08/24/2022 8:54 PM

## 2022-08-24 NOTE — ED Notes (Signed)
Pt's friend called RN in room stating pt needs help; went in to find pt shaking all over, but alert and able to answer my questions; when asked what is wrong pt stated he is shaking due to the head pain

## 2022-08-24 NOTE — ED Provider Notes (Signed)
[facility] EMERGENCY DEPARTMENT Provider Note   CSN: [id number] Arrival date & time: 08/24/22  1100     History  Chief Complaint  Patient presents with   Altered Mental Status    Jordan Ward is a 81 y.o. male.   Altered Mental Status Associated symptoms: headaches and weakness (generalized)   Patient presents for altered mental status.  Medical history includes pulmonary fibrosis, multiple sclerosis, HTN, CVA, CHF, obstructive uropathy, chronic suprapubic catheter, left percutaneous nephrostomy tube.  This morning, he awoke stating that he felt dizzy.  Blood pressure at the time was elevated in the range of 160 SBP.  Wife felt that he had episode of confusion and slurred speech.  EMS was called.  On arrival, patient states that he is "thirsty as the [employer]".  He denies any areas of discomfort.  He has had a cough for several weeks.     Home Medications Prior to Admission medications   Medication Sig Start Date End Date Taking? Authorizing Provider  acetaminophen (TYLENOL) 325 MG tablet Take 650 mg by mouth every 6 (six) hours as needed for mild pain, moderate pain, fever or headache.   Yes [provider]  amLODipine (NORVASC) 10 MG tablet Take 5 mg by mouth every evening. After supper. 05/21/21  Yes [provider]  Carboxymethylcellulose Sodium (DRY EYE RELIEF OP) Place 1 drop into both eyes 4 (four) times daily as needed (dry eyes).   Yes [provider]  diazepam (VALIUM) 5 MG tablet Take 5 mg by mouth 2 (two) times daily.   Yes [provider]  HYDROcodone-acetaminophen (NORCO) 7.5-325 MG per tablet Take 1 tablet by mouth 2 (two) times daily. Max APAP 3 GM IN 24 HOURS FROM ALL SOURCES Patient taking differently: Take 1 tablet by mouth at bedtime. 08/22/14  Yes Blanchie Serve, MD  levothyroxine (SYNTHROID) 75 MCG tablet Take 75 mcg by mouth in the morning. 07/02/21  Yes [provider]  loperamide (IMODIUM A-D) 2 MG tablet Take 1  tablet (2 mg total) by mouth 4 (four) times daily as needed for diarrhea or loose stools. 05/22/22  Yes Shah, Pratik D, DO  potassium chloride SA (KLOR-CON) 20 MEQ tablet Take 20 mEq by mouth every evening. After supper 07/02/21  Yes [provider]  levofloxacin (LEVAQUIN) 500 MG tablet Take 1 as directed Patient not taking: Reported on 08/24/2022 04/15/22   Franchot Gallo, MD  OXYGEN Inhale 1 L into the lungs every evening.    [provider]      Allergies    Tetracyclines & related and Cipro [ciprofloxacin hcl]    Review of Systems   Review of Systems  Constitutional:  Positive for activity change and fatigue.  Musculoskeletal:  Positive for gait problem.  Neurological:  Positive for dizziness, weakness (generalized) and headaches.  All other systems reviewed and are negative.   Physical Exam Updated Vital Signs BP (!) 151/75   Pulse (!) 54   Temp 98.1 F (36.7 C) (Oral)   Resp 13   Ht '5\' 5"'$  (1.651 m)   Wt 52.2 kg   SpO2 98%   BMI 19.14 kg/m  Physical Exam Vitals and nursing note reviewed.  Constitutional:      General: He is not in acute distress.    Appearance: He is well-developed. He is ill-appearing. He is not toxic-appearing or diaphoretic.  HENT:     Head: Normocephalic and atraumatic.     Right Ear: External ear normal.  Left Ear: External ear normal.     Nose: Nose normal.     Mouth/Throat:     Mouth: Mucous membranes are dry.     Pharynx: Oropharynx is clear.  Eyes:     Extraocular Movements: Extraocular movements intact.     Conjunctiva/sclera: Conjunctivae normal.  Cardiovascular:     Rate and Rhythm: Normal rate and regular rhythm.     Heart sounds: No murmur heard. Pulmonary:     Effort: Pulmonary effort is normal. No respiratory distress.  Abdominal:     General: There is no distension.     Palpations: Abdomen is soft.     Tenderness: There is no abdominal tenderness.     Hernia: A hernia is present.  Genitourinary:     Comments: Suprapubic and PCN tube present Musculoskeletal:        General: No swelling. Normal range of motion.     Cervical back: Normal range of motion and neck supple.     Right lower leg: No edema.     Left lower leg: No edema.  Skin:    General: Skin is warm and dry.     Coloration: Skin is not jaundiced or pale.  Neurological:     General: No focal deficit present.     Mental Status: He is alert and oriented to person, place, and time.     Cranial Nerves: No cranial nerve deficit.     Sensory: No sensory deficit.     Motor: No weakness.     Coordination: Coordination normal.  Psychiatric:        Mood and Affect: Mood normal.        Behavior: Behavior normal.        Thought Content: Thought content normal.        Judgment: Judgment normal.     ED Results / Procedures / Treatments   Labs (all labs ordered are listed, but only abnormal results are displayed) Labs Reviewed  COMPREHENSIVE METABOLIC PANEL - Abnormal; Notable for the following components:      Result Value   Potassium 2.8 (*)    Glucose, Bld 109 (*)    BUN 31 (*)    Creatinine, Ser 2.02 (*)    Calcium 8.7 (*)    GFR, Estimated 33 (*)    All other components within normal limits  URINALYSIS, ROUTINE W REFLEX MICROSCOPIC - Abnormal; Notable for the following components:   APPearance CLOUDY (*)    Hgb urine dipstick MODERATE (*)    Protein, ur 100 (*)    Nitrite POSITIVE (*)    Leukocytes,Ua LARGE (*)    WBC, UA >50 (*)    Bacteria, UA FEW (*)    All other components within normal limits  TROPONIN I (HIGH SENSITIVITY) - Abnormal; Notable for the following components:   Troponin I (High Sensitivity) 19 (*)    All other components within normal limits  CULTURE, BLOOD (ROUTINE X 2)  RESP PANEL BY RT-PCR (FLU A&B, COVID) ARPGX2  CULTURE, BLOOD (ROUTINE X 2)  URINE CULTURE  CBC  LACTIC ACID, PLASMA  PROTIME-INR  TSH  MAGNESIUM  TROPONIN I (HIGH SENSITIVITY)    EKG EKG  Interpretation  Date/Time:  Sunday August 24 2022 11:11:52 EST Ventricular Rate:  63 PR Interval:  59 QRS Duration: 125 QT Interval:  521 QTC Calculation: 534 R Axis:   48 Text Interpretation: Sinus rhythm Short PR interval Left bundle branch block Confirmed by Godfrey Pick ([id number]) on 08/24/2022 2:03:45 PM  Radiology DG Chest Port 1 View  Result Date: 08/24/2022 CLINICAL DATA:  Questionable sepsis EXAM: PORTABLE CHEST 1 VIEW COMPARISON:  Chest x-ray dated May 26, 2022 FINDINGS: Patient rotation to the right somewhat limits evaluation. Cardiac and mediastinal contours are unchanged. Coarse interstitial opacities, unchanged when compared with prior exam and likely due to underlying chronic lung disease. No evidence of acute airspace opacity. No large pleural effusion or pneumothorax IMPRESSION: Background chronic lung disease with no evidence of acute airspace opacity. Electronically Signed   By: Yetta Glassman M.D.   On: 08/24/2022 12:19   CT ABDOMEN PELVIS WO CONTRAST  Result Date: 08/24/2022 CLINICAL DATA:  Sepsis. EXAM: CT ABDOMEN AND PELVIS WITHOUT CONTRAST TECHNIQUE: Multidetector CT imaging of the abdomen and pelvis was performed following the standard protocol without IV contrast. RADIATION DOSE REDUCTION: This exam was performed according to the departmental dose-optimization program which includes automated exposure control, adjustment of the mA and/or kV according to patient size and/or use of iterative reconstruction technique. COMPARISON:  CT 10/10/2021 FINDINGS: Lower chest: No pleural fluid or airspace disease. Signs of chronic interstitial lung disease identified with bilateral lower lobe interstitial reticulation and subpleural honeycombing. Hepatobiliary: No suspicious liver lesion. Status post cholecystectomy. Unchanged increase caliber of the CBD which measures up to 1.1 cm. Pneumobilia is identified compatible with biliary patency. Pancreas: Unremarkable. No pancreatic  ductal dilatation or surrounding inflammatory changes. Spleen: Normal in size without focal abnormality. Adrenals/Urinary Tract: Normal adrenal glands. Asymmetric right renal atrophy. Right renal calculi are again noted. The largest is in the inferior pole measuring 5 mm. No right-sided hydronephrosis. Stones within the left renal collecting system are again noted. The largest is in the inferior pole measuring 4 mm, image 28/3. There is a percutaneous nephrostomy tube in place. There is pelvocaliectasis which appears mildly increased from previous exam. AP diameter of the extrarenal pelvis measures 1.8 cm. There is no sign of hydroureter distal to the left UPJ. Bilateral kidney cysts are again seen. No follow-up imaging recommended. The urinary bladder is partially decompressed around a suprapubic catheter. There is a small amount of air within the bladder lumen which may reflect instrumentation. Stomach/Bowel: Stomach appears nondistended. No bowel Caporaso thickening, inflammation, or distension. Signs of previous colonic resection with anastomotic suture chain identified at the level of the sigmoid colon. Previous appendectomy. Moderate stool burden identified within the colon and rectum. Vascular/Lymphatic: Extensive aortic atherosclerosis with branch vessel involvement. No signs of abdominopelvic adenopathy. Reproductive: Prostate gland enlargement. Other: No free fluid or fluid collections. Midline anterior abdominal Collantes laxity with ventral herniation of nonobstructed loops of small bowel. Unchanged in appearance from previous exam. No signs of pneumoperitoneum. Musculoskeletal: Bones appear diffusely osteopenic. No acute or suspicious osseous findings. Increased sclerosis within the right femoral head appears similar to the prior exam and may reflect early avascular necrosis. IMPRESSION: 1. No acute findings identified within the abdomen or pelvis. 2. Left percutaneous nephrostomy tube in place with mild  increase in pelvocaliectasis. 3. Bilateral nephrolithiasis. 4. Prostate gland enlargement. 5. Signs of chronic interstitial lung disease. 6. Increased sclerosis within the right femoral head appears similar to the prior exam and may reflect early avascular necrosis. 7.  Aortic Atherosclerosis (ICD10-I70.0). Electronically Signed   By: Kerby Moors M.D.   On: 08/24/2022 12:13   CT Head Wo Contrast  Result Date: 08/24/2022 CLINICAL DATA:  81 year old male with altered mental status, foul smelling urine. Atrial fibrillation. EXAM: CT HEAD WITHOUT CONTRAST TECHNIQUE: Contiguous axial images were obtained from the  base of the skull through the vertex without intravenous contrast. RADIATION DOSE REDUCTION: This exam was performed according to the departmental dose-optimization program which includes automated exposure control, adjustment of the mA and/or kV according to patient size and/or use of iterative reconstruction technique. COMPARISON:  Brain MRI 10/03/2011.  Head CT 02/13/2021. FINDINGS: Brain: Stable cerebral volume. Pronounced bilateral cerebral white matter disease, chronic lacunar infarcts of the right corona radiata, right greater than left deep gray nuclei. Some ex vacuo ventricular enlargement. No midline shift, mass effect, or evidence of intracranial mass lesion. No acute intracranial hemorrhage identified. No cortically based acute infarct identified. Gray-white differentiation appears stable from last year. Vascular: Extensive Calcified atherosclerosis at the skull base. No suspicious intracranial vascular hyperdensity. Skull: No acute osseous abnormality identified. Sinuses/Orbits: Visualized paranasal sinuses and mastoids are clear. Other: No acute orbit or scalp soft tissue finding. IMPRESSION: No acute intracranial abnormality. Stable since last year non contrast CT appearance of advanced small vessel disease. Electronically Signed   By: Genevie Ann M.D.   On: 08/24/2022 12:09     Procedures Procedures    Medications Ordered in ED Medications  potassium chloride 10 mEq in 100 mL IVPB (10 mEq Intravenous New Bag/Given 08/24/22 1444)  lactated ringers bolus 1,000 mL (0 mLs Intravenous Stopped 08/24/22 1257)  acetaminophen (TYLENOL) tablet 650 mg (650 mg Oral Given 08/24/22 1200)  ceFEPIme (MAXIPIME) 2 g in sodium chloride 0.9 % 100 mL IVPB (0 g Intravenous Stopped 08/24/22 1324)  potassium chloride (KLOR-CON) packet 40 mEq (40 mEq Oral Given 08/24/22 1322)  famotidine (PEPCID) IVPB 20 mg premix (0 mg Intravenous Stopped 08/24/22 1444)  metoCLOPramide (REGLAN) injection 5 mg (5 mg Intravenous Given 08/24/22 1409)    ED Course/ Medical Decision Making/ A&P                           Medical Decision Making Amount and/or Complexity of Data Reviewed Labs: ordered. Radiology: ordered. ECG/medicine tests: ordered.  Risk OTC drugs. Prescription drug management. Decision regarding hospitalization.   This patient presents to the ED for concern of dizziness, generalized weakness, this involves an extensive number of treatment options, and is a complaint that carries with it a high risk of complications and morbidity.  The differential diagnosis includes CVA, TIA, infection, metabolic derangements, dehydration   Co morbidities that complicate the patient evaluation  pulmonary fibrosis, multiple sclerosis, HTN, CVA, CHF, obstructive uropathy, chronic suprapubic catheter, left percutaneous nephrostomy tube   Additional history obtained:  Additional history obtained from patient's wife External records from outside source obtained and reviewed including EMR   Lab Tests:  I Ordered, and personally interpreted labs.  The pertinent results include: Creatinine is increased from baseline.  Patient has hypokalemia with otherwise normal electrolytes, hemoglobin is normal and there is no leukocytosis present.  Urinalysis is consistent with acute  infection.   Imaging Studies ordered:  I ordered imaging studies including CT head, CT of abdomen pelvis, chest x-ray I independently visualized and interpreted imaging which showed acute findings.  Left PCN tube is in proper position. I agree with the radiologist interpretation   Cardiac Monitoring: / EKG:  The patient was maintained on a cardiac monitor.  I personally viewed and interpreted the cardiac monitored which showed an underlying rhythm of: Sinus rhythm   Problem List / ED Course / Critical interventions / Medication management  Patient presents due to dizziness and concern of altered mental status at home.  Onset was  this morning.  Patient has a history of recurrent UTIs in the setting of obstructive uropathy s/p suprapubic catheter and left PCN tube.  His urologist is Dr. [name].  On arrival in the ED, patient is alert and oriented.  He does not focal neurologic deficits.  He does endorse a headache, generalized weakness, fatigue, and thirst.  IV fluids were initiated.  Septic work-up was initiated.  Patient was noted to have rigors while in the ED.  Tylenol was given.  On lab work, patient is found to have increased creatinine from baseline and hypokalemia.  Repleted potassium was ordered.  Urinalysis is consistent with acute infection.  Given presence of suprapubic catheter and PCN tube, cefepime was ordered.  After oral potassium, he did have some GI upset.  Reglan and Pepcid were ordered.  On CT scan, PCN tube is in proper position without any evidence of obstruction.  Patient to be admitted for treatment of pyelonephritis. I ordered medication including Tylenol for rigors, IVF for hydration, potassium chloride for hypokalemia, cefepime for pyelonephritis, Reglan and Pepcid for GI upset Reevaluation of the patient after these medicines showed that the patient improved I have reviewed the patients home medicines and have made adjustments as needed   Social Determinants of  Health:  Lives at home with wife         Final Clinical Impression(s) / ED Diagnoses Final diagnoses:  Pyelonephritis  Hypokalemia  Dehydration    Rx / DC Orders ED Discharge Orders     None         Godfrey Pick, MD 08/24/22 1534

## 2022-08-24 NOTE — ED Triage Notes (Addendum)
Pt BIBA from home. Pt woke up altered this morning, with strong, foul smelling urine. Pt has hx of afib, is in a fib.   VSS with EMS CBG 88  Pt states that he has felt "wobbly" and dizzy. Pt is A&O x 4 upon arrival with me.

## 2022-08-24 NOTE — ED Notes (Signed)
Attempted report, RN to call back

## 2022-08-24 NOTE — ED Notes (Signed)
Pt has suprapubic catheter in place.

## 2022-08-25 DIAGNOSIS — B952 Enterococcus as the cause of diseases classified elsewhere: Secondary | ICD-10-CM | POA: Diagnosis present

## 2022-08-25 DIAGNOSIS — N179 Acute kidney failure, unspecified: Secondary | ICD-10-CM | POA: Diagnosis present

## 2022-08-25 DIAGNOSIS — N319 Neuromuscular dysfunction of bladder, unspecified: Secondary | ICD-10-CM | POA: Diagnosis present

## 2022-08-25 DIAGNOSIS — Z87891 Personal history of nicotine dependence: Secondary | ICD-10-CM | POA: Diagnosis not present

## 2022-08-25 DIAGNOSIS — Z936 Other artificial openings of urinary tract status: Secondary | ICD-10-CM | POA: Diagnosis not present

## 2022-08-25 DIAGNOSIS — Z9359 Other cystostomy status: Secondary | ICD-10-CM | POA: Diagnosis not present

## 2022-08-25 DIAGNOSIS — E876 Hypokalemia: Secondary | ICD-10-CM | POA: Diagnosis present

## 2022-08-25 DIAGNOSIS — G8929 Other chronic pain: Secondary | ICD-10-CM | POA: Diagnosis present

## 2022-08-25 DIAGNOSIS — N390 Urinary tract infection, site not specified: Secondary | ICD-10-CM | POA: Diagnosis not present

## 2022-08-25 DIAGNOSIS — R1312 Dysphagia, oropharyngeal phase: Secondary | ICD-10-CM | POA: Diagnosis not present

## 2022-08-25 DIAGNOSIS — N1832 Chronic kidney disease, stage 3b: Secondary | ICD-10-CM | POA: Diagnosis present

## 2022-08-25 DIAGNOSIS — E785 Hyperlipidemia, unspecified: Secondary | ICD-10-CM | POA: Diagnosis present

## 2022-08-25 DIAGNOSIS — I739 Peripheral vascular disease, unspecified: Secondary | ICD-10-CM | POA: Diagnosis present

## 2022-08-25 DIAGNOSIS — Z8249 Family history of ischemic heart disease and other diseases of the circulatory system: Secondary | ICD-10-CM | POA: Diagnosis not present

## 2022-08-25 DIAGNOSIS — M6281 Muscle weakness (generalized): Secondary | ICD-10-CM | POA: Diagnosis not present

## 2022-08-25 DIAGNOSIS — Z823 Family history of stroke: Secondary | ICD-10-CM | POA: Diagnosis not present

## 2022-08-25 DIAGNOSIS — I2510 Atherosclerotic heart disease of native coronary artery without angina pectoris: Secondary | ICD-10-CM | POA: Diagnosis present

## 2022-08-25 DIAGNOSIS — G35 Multiple sclerosis: Secondary | ICD-10-CM | POA: Diagnosis present

## 2022-08-25 DIAGNOSIS — N12 Tubulo-interstitial nephritis, not specified as acute or chronic: Secondary | ICD-10-CM | POA: Diagnosis present

## 2022-08-25 DIAGNOSIS — N189 Chronic kidney disease, unspecified: Secondary | ICD-10-CM | POA: Diagnosis not present

## 2022-08-25 DIAGNOSIS — R41841 Cognitive communication deficit: Secondary | ICD-10-CM | POA: Diagnosis not present

## 2022-08-25 DIAGNOSIS — E860 Dehydration: Secondary | ICD-10-CM | POA: Diagnosis present

## 2022-08-25 DIAGNOSIS — Z1152 Encounter for screening for COVID-19: Secondary | ICD-10-CM | POA: Diagnosis not present

## 2022-08-25 DIAGNOSIS — N183 Chronic kidney disease, stage 3 unspecified: Secondary | ICD-10-CM | POA: Diagnosis not present

## 2022-08-25 DIAGNOSIS — I10 Essential (primary) hypertension: Secondary | ICD-10-CM | POA: Diagnosis not present

## 2022-08-25 DIAGNOSIS — G4733 Obstructive sleep apnea (adult) (pediatric): Secondary | ICD-10-CM | POA: Diagnosis present

## 2022-08-25 DIAGNOSIS — R1310 Dysphagia, unspecified: Secondary | ICD-10-CM | POA: Diagnosis present

## 2022-08-25 DIAGNOSIS — N200 Calculus of kidney: Secondary | ICD-10-CM | POA: Diagnosis present

## 2022-08-25 DIAGNOSIS — I129 Hypertensive chronic kidney disease with stage 1 through stage 4 chronic kidney disease, or unspecified chronic kidney disease: Secondary | ICD-10-CM | POA: Diagnosis present

## 2022-08-25 DIAGNOSIS — G4700 Insomnia, unspecified: Secondary | ICD-10-CM | POA: Diagnosis present

## 2022-08-25 DIAGNOSIS — T83510A Infection and inflammatory reaction due to cystostomy catheter, initial encounter: Secondary | ICD-10-CM | POA: Diagnosis present

## 2022-08-25 DIAGNOSIS — M1990 Unspecified osteoarthritis, unspecified site: Secondary | ICD-10-CM | POA: Diagnosis not present

## 2022-08-25 DIAGNOSIS — Y846 Urinary catheterization as the cause of abnormal reaction of the patient, or of later complication, without mention of misadventure at the time of the procedure: Secondary | ICD-10-CM | POA: Diagnosis present

## 2022-08-25 DIAGNOSIS — E039 Hypothyroidism, unspecified: Secondary | ICD-10-CM | POA: Diagnosis present

## 2022-08-25 DIAGNOSIS — I69354 Hemiplegia and hemiparesis following cerebral infarction affecting left non-dominant side: Secondary | ICD-10-CM | POA: Diagnosis not present

## 2022-08-25 DIAGNOSIS — I69952 Hemiplegia and hemiparesis following unspecified cerebrovascular disease affecting left dominant side: Secondary | ICD-10-CM | POA: Diagnosis not present

## 2022-08-25 DIAGNOSIS — R2689 Other abnormalities of gait and mobility: Secondary | ICD-10-CM | POA: Diagnosis not present

## 2022-08-25 LAB — CBC
HCT: 37.1 % — ABNORMAL LOW (ref 39.0–52.0)
Hemoglobin: 12.1 g/dL — ABNORMAL LOW (ref 13.0–17.0)
MCH: 29.9 pg (ref 26.0–34.0)
MCHC: 32.6 g/dL (ref 30.0–36.0)
MCV: 91.6 fL (ref 80.0–100.0)
Platelets: 150 10*3/uL (ref 150–400)
RBC: 4.05 MIL/uL — ABNORMAL LOW (ref 4.22–5.81)
RDW: 14.2 % (ref 11.5–15.5)
WBC: 6.4 10*3/uL (ref 4.0–10.5)
nRBC: 0 % (ref 0.0–0.2)

## 2022-08-25 LAB — COMPREHENSIVE METABOLIC PANEL
ALT: 9 U/L (ref 0–44)
AST: 15 U/L (ref 15–41)
Albumin: 3.3 g/dL — ABNORMAL LOW (ref 3.5–5.0)
Alkaline Phosphatase: 101 U/L (ref 38–126)
Anion gap: 5 (ref 5–15)
BUN: 19 mg/dL (ref 8–23)
CO2: 24 mmol/L (ref 22–32)
Calcium: 7.9 mg/dL — ABNORMAL LOW (ref 8.9–10.3)
Chloride: 112 mmol/L — ABNORMAL HIGH (ref 98–111)
Creatinine, Ser: 1.54 mg/dL — ABNORMAL HIGH (ref 0.61–1.24)
GFR, Estimated: 46 mL/min — ABNORMAL LOW (ref >60)
Glucose, Bld: 89 mg/dL (ref 70–99)
Potassium: 3 mmol/L — ABNORMAL LOW (ref 3.5–5.1)
Sodium: 141 mmol/L (ref 135–145)
Total Bilirubin: 0.7 mg/dL (ref 0.3–1.2)
Total Protein: 6.1 g/dL — ABNORMAL LOW (ref 6.5–8.1)

## 2022-08-25 MED ORDER — ENOXAPARIN SODIUM 30 MG/0.3ML IJ SOSY
30.0000 mg | PREFILLED_SYRINGE | INTRAMUSCULAR | Status: DC
  Administered 2022-08-25 – 2022-08-29 (×5): 30 mg via SUBCUTANEOUS
  Filled 2022-08-25 (×5): qty 0.3

## 2022-08-25 MED ORDER — POTASSIUM CHLORIDE CRYS ER 20 MEQ PO TBCR
40.0000 meq | EXTENDED_RELEASE_TABLET | Freq: Once | ORAL | Status: AC
  Administered 2022-08-25: 40 meq via ORAL
  Filled 2022-08-25: qty 2

## 2022-08-25 MED ORDER — SODIUM CHLORIDE 0.9 % IV SOLN: INTRAVENOUS | Status: DC

## 2022-08-25 NOTE — Evaluation (Signed)
Physical Therapy Evaluation Patient Details Name: BOBBIE VALLETTA MRN: [id number] DOB: [birth year] Today's Date: 08/25/2022  History of Present Illness  LEANDER TOUT is a 81 y.o. male with medical history significant of multiple sclerosis, neurogenic bladder status post suprapubic catheter, left percutaneous nephrostomy tube, reports that he was in his usual state of health when this morning he was going to the bathroom to empty his nephrostomy bag.  He became increasingly dizzy.  Wife reports that he began shaking.  Denies any recent vomiting, diarrhea, shortness of breath, fever.  Reports that he does have a chronic cough with some p.o. intake, but this has not changed recently.  He was brought to the hospital for evaluation.  Blood pressure noted to be elevated.  Initial labs show significant hypokalemia.  His creatinine is mildly trended up.  Urinalysis indicated possible infection.  He was started on antibiotics and IV fluids.   Clinical Impression  Patient demonstrates slow labored movement for sitting up at bedside requiring use of bed rail, once seated c/o severe dizziness/spinning sensation that did not resolve, only tolerated standing for a few seconds using RW before having to sit due to fall risk and unable to transfer to chair due to symptoms - RN notified.  Patient put back to bed after therapy.  Patient will benefit from continued skilled physical therapy in hospital and recommended venue below to increase strength, balance, endurance for safe ADLs and gait.         Recommendations for follow up therapy are one component of a multi-disciplinary discharge planning process, led by the attending physician.  Recommendations may be updated based on patient status, additional functional criteria and insurance authorization.  Follow Up Recommendations Skilled nursing-short term rehab (<3 hours/day) Can patient physically be transported by private vehicle: No    Assistance Recommended at  Discharge Intermittent Supervision/Assistance  Patient can return home with the following  A lot of help with bathing/dressing/bathroom;A lot of help with walking and/or transfers;Help with stairs or ramp for entrance;Assistance with cooking/housework    Equipment Recommendations None recommended by PT  Recommendations for Other Services       Functional Status Assessment Patient has had a recent decline in their functional status and demonstrates the ability to make significant improvements in function in a reasonable and predictable amount of time.     Precautions / Restrictions Precautions Precautions: Fall Restrictions Weight Bearing Restrictions: No      Mobility  Bed Mobility Overal bed mobility: Needs Assistance Bed Mobility: Supine to Sit, Sit to Supine     Supine to sit: Min guard, Min assist Sit to supine: Min guard, Min assist   General bed mobility comments: increased time, labored movement, required use of bed rail for supine to sitting    Transfers Overall transfer level: Needs assistance Equipment used: Rolling walker (2 wheels) Transfers: Sit to/from Stand Sit to Stand: Min assist, Mod assist           General transfer comment: poor tolerance for standing due to c/o dizziness, spinning sensation    Ambulation/Gait                  Stairs            Wheelchair Mobility    Modified Rankin (Stroke Patients Only)       Balance Overall balance assessment: Needs assistance Sitting-balance support: Feet supported, No upper extremity supported Sitting balance-Leahy Scale: Fair Sitting balance - Comments: seated at EOB   Standing balance  support: Reliant on assistive device for balance, During functional activity, Bilateral upper extremity supported Standing balance-Leahy Scale: Poor Standing balance comment: fair/poor mostly due to c/o dizziness/room spinning                             Pertinent Vitals/Pain Pain  Assessment Pain Assessment: No/denies pain    Home Living Family/patient expects to be discharged to:: Private residence Living Arrangements: Spouse/significant other;Children Available Help at Discharge: Family;Available PRN/intermittently Type of Home: House Home Access: Stairs to enter Entrance Stairs-Rails: Right Entrance Stairs-Number of Steps: 2   Home Layout: One level Home Equipment: Conservation officer, nature (2 wheels);Cane - single point;BSC/3in1      Prior Function Prior Level of Function : Needs assist       Physical Assist : ADLs (physical);Mobility (physical) Mobility (physical): Bed mobility;Transfers;Gait;Stairs   Mobility Comments: [place] ambulator with RW ADLs Comments: Independent ADL; family assists with IADL's     Hand Dominance   Dominant Hand: Right    Extremity/Trunk Assessment   Upper Extremity Assessment Upper Extremity Assessment: Defer to OT evaluation    Lower Extremity Assessment Lower Extremity Assessment: Generalized weakness    Cervical / Trunk Assessment Cervical / Trunk Assessment: Normal  Communication   Communication: No difficulties  Cognition Arousal/Alertness: Awake/alert Behavior During Therapy: WFL for tasks assessed/performed Overall Cognitive Status: Within Functional Limits for tasks assessed                                          General Comments      Exercises     Assessment/Plan    PT Assessment Patient needs continued PT services  PT Problem List Decreased strength;Decreased activity tolerance;Decreased balance;Decreased mobility       PT Treatment Interventions DME instruction;Gait training;Stair training;Functional mobility training;Therapeutic activities;Therapeutic exercise;Balance training;Patient/family education    PT Goals (Current goals can be found in the Care Plan section)  Acute Rehab PT Goals Patient Stated Goal: return home with family to assist PT Goal Formulation: With  patient Time For Goal Achievement: 09/08/22 Potential to Achieve Goals: Good    Frequency Min 3X/week     Co-evaluation               AM-PAC PT "6 Clicks" Mobility  Outcome Measure Help needed turning from your back to your side while in a flat bed without using bedrails?: A Little Help needed moving from lying on your back to sitting on the side of a flat bed without using bedrails?: A Little Help needed moving to and from a bed to a chair (including a wheelchair)?: A Lot Help needed standing up from a chair using your arms (e.g., wheelchair or bedside chair)?: A Lot Help needed to walk in hospital room?: Total Help needed climbing 3-5 steps with a railing? : Total 6 Click Score: 12    End of Session   Activity Tolerance: Patient tolerated treatment well;Patient limited by fatigue;Other (comment) (limited due to dizziness) Patient left: in bed;with call bell/phone within reach;with bed alarm set Nurse Communication: Mobility status PT Visit Diagnosis: Unsteadiness on feet (R26.81);Other abnormalities of gait and mobility (R26.89);Muscle weakness (generalized) (M62.81)    Time: 2620-3559 PT Time Calculation (min) (ACUTE ONLY): 28 min   Charges:   PT Evaluation $PT Eval Moderate Complexity: 1 Mod PT Treatments $Therapeutic Activity: 23-37 mins  3:28 PM, 08/25/22 Lonell Grandchild, MPT Physical Therapist with [facility] [phone] office [phone] mobile phone

## 2022-08-25 NOTE — Plan of Care (Signed)
  Problem: Acute Rehab PT Goals(only PT should resolve) Goal: Pt Will Go Supine/Side To Sit Outcome: Progressing Flowsheets (Taken 08/25/2022 1530) Pt will go Supine/Side to Sit:  with modified independence  with supervision Goal: Patient Will Transfer Sit To/From Stand Outcome: Progressing Flowsheets (Taken 08/25/2022 1530) Patient will transfer sit to/from stand: with supervision Goal: Pt Will Transfer Bed To Chair/Chair To Bed Outcome: Progressing Flowsheets (Taken 08/25/2022 1530) Pt will Transfer Bed to Chair/Chair to Bed:  with supervision  min guard assist Goal: Pt Will Ambulate Outcome: Progressing Flowsheets (Taken 08/25/2022 1530) Pt will Ambulate:  15 feet  with minimal assist  with rolling walker   3:30 PM, 08/25/22 Lonell Grandchild, MPT Physical Therapist with [facility] [phone] office [phone] mobile phone

## 2022-08-25 NOTE — Progress Notes (Signed)
PROGRESS NOTE  Jordan Ward  DOB: 1940-11-01  PCP: Valentino Nose, [employer] DVV:[id number]  DOA: 08/24/2022  LOS: 0 days  Hospital Day: 2  Brief narrative: Jordan Ward is a 81 y.o. male with PMH significant for multiple sclerosis with neurogenic bladder s/p suprapubic catheter, left percutaneous nephrostomy tube in place, HTN, HLD, CAD, PAD, paroxysmal atrial tachycardia, stroke with left-sided weakness, chronic anemia, arthritis, chronic back pain, hypothyroidism, OSA 11/26, patient woke up altered in the morning.  Family noted strong foul-smelling urine.  He also had an episode of dizziness, shaking and hence brought to the ED.  In the ED, he was afebrile, blood pressure elevated to 170s, breathing on room air Initial workup showed unremarkable CBC, CMP with potassium low at 2.8, BUN/creatinine elevated to 31/2.02, troponin mildly elevated at 19, lactic acid normal at 1.1. Respiratory panel negative for COVID, flu Urinalysis showed cloudy yellow urine with moderate amount of hemoglobin, large leukocytes, positive nitrate, few bacteria Urine culture was sent CT abdomen pelvis did not show any acute findings.  It showed bilateral nephrolithiasis with left percutaneous nephrostomy tube in place. It also showed chronic interstitial lung disease. Patient was started on empiric IV antibiotics.  Subjective: Patient was seen and examined this morning.  Pleasant elderly Caucasian male.  Propped up in bed.  Taking his breakfast.  No nausea, vomiting but complains of dizziness on any movement. Chart reviewed Remains afebrile, heart rate in 50s and 60s, blood pressure in 140s, breathing on room air  Assessment and plan: UTI a/w chronic suprapubic catheter Presented with foul-smelling urine, dizziness Urinalysis as above suggestive of infection.  Urine culture sent Currently on empiric IV cefepime.  Dizziness on any movement Likely because of dehydration.  Need to rule out BPPV as well.  Continue  to monitor with hydration.  PT/OT eval ordered  Multiple sclerosis with neurogenic bladder Suprapubic catheter in place For MS, patient does not seem to be on any disease modifying agents Continue supportive management with pain management and benzodiazepines Followup with [facility]  Bilateral nephrolithiasis  chronic Left percutaneous nephrostomy  AKI on CKD 3b Baseline creatinine 1.69.  Presented with creatinine elevated 2.02. No evidence of obstruction in imaging. Creatinine improved with hydration. Recent Labs    05/18/22 2145 05/19/22 0648 05/20/22 0435 05/21/22 0519 05/22/22 0631 08/24/22 1116 08/25/22 0502  BUN 29* 28* 30* 23 27* 31* 19  CREATININE 2.35* 2.26* 1.92* 1.58* 1.69* 2.02* 1.54*   Hypokalemia Replaced in the ED.  Potassium remains low this morning.  Replacement ordered.  Also on regular potassium supplement at home.  Resumed Recent Labs  Lab 08/24/22 1116 08/24/22 1601 08/25/22 0502  K 2.8*  --  3.0*  MG  --  2.2  --    Hypothyroidism Continue Synthroid   Dysphagia Reports that he normally has to have his food chopped up, but does tolerate thin liquids.  Needs to have large pills taken with applesauce Currently on dysphagia 2 diet   Hypertension Continue home dose of Norvasc   Generalized weakness Normally ambulates with a walker PT/OT ordered   Abnormal femoral head finding Imaging on admission showed increased sclerosis within the right femoral head appears similar to the prior exam and may reflect early avascular necrosis.  Goals of care   Code Status: Full Code    Mobility: Encourage ambulation.  PT/OT eval ordered  Infusions:   sodium chloride     ceFEPime (MAXIPIME) IV      Scheduled Meds:  amLODipine  5 mg Oral QPM   diazepam  5 mg Oral BID   enoxaparin (LOVENOX) injection  30 mg Subcutaneous Q24H   HYDROcodone-acetaminophen  1 tablet Oral BID   levothyroxine  75 mcg Oral q AM   potassium chloride  SA  20 mEq Oral QPM   potassium chloride  40 mEq Oral Once    PRN meds: acetaminophen **OR** acetaminophen, ondansetron **OR** ondansetron (ZOFRAN) IV, Polyethyl Glycol-Propyl Glycol   Skin assessment:     Nutritional status:  Body mass index is 19.14 kg/m.          Diet:  Diet Order             DIET DYS 2 Room service appropriate? Yes; Fluid consistency: Thin  Diet effective now                   DVT prophylaxis:  enoxaparin (LOVENOX) injection 30 mg Start: 08/25/22 2000   Antimicrobials: IV cefepime Fluid: NS at 75 mill per hour Consultants: None Family Communication: None at bedside  Status is: Observation  Continue inhospital care because: Pending urine culture report.  Remains on IV antibiotics Level of care: Telemetry   Dispo: The patient is from: Home              Anticipated d/c is to: Home in 1 to 2 days              Patient currently is not medically stable to d/c.   Difficult to place patient No       Antimicrobials: Anti-infectives (From admission, onward)    Start     Dose/Rate Route Frequency Ordered Stop   08/25/22 1400  ceFEPIme (MAXIPIME) 1 g in sodium chloride 0.9 % 100 mL IVPB        1 g 200 mL/hr over 30 Minutes Intravenous Every 24 hours 08/24/22 2054     08/24/22 1300  ceFEPIme (MAXIPIME) 2 g in sodium chloride 0.9 % 100 mL IVPB        2 g 200 mL/hr over 30 Minutes Intravenous  Once 08/24/22 1247 08/24/22 1324       Objective: Vitals:   08/25/22 0027 08/25/22 0350  BP: (!) 142/81 136/81  Pulse: 66 (!) 53  Resp: 16 18  Temp: 98.1 F (36.7 C) (!) 96.9 F (36.1 C)  SpO2: 96% 100%    Intake/Output Summary (Last 24 hours) at 08/25/2022 1018 Last data filed at 08/25/2022 0542 Gross per 24 hour  Intake 1680.86 ml  Output 2025 ml  Net -344.14 ml   Filed Weights   08/24/22 1114  Weight: 52.2 kg   Weight change:  Body mass index is 19.14 kg/m.   Physical Exam: General exam: Pleasant, elderly.  Not in  distress. Skin: No rashes, lesions or ulcers. HEENT: Atraumatic, normocephalic, no obvious bleeding Lungs: Clear to auscultation bilaterally CVS: Regular rate and rhythm, no murmur GI/Abd soft, nontender, nondistended, bowel sound present.  Suprapubic catheter and left nephrostomy catheter in place CNS: Alert, awake, oriented x 3 Psychiatry: Mood appropriate Extremities: No pedal edema, no calf tenderness  Data Review: I have personally reviewed the laboratory data and studies available.  F/u labs ordered Unresulted Labs (From admission, onward)     Start     Ordered   08/31/22 0500  Creatinine, serum  (enoxaparin (LOVENOX)    CrCl >/= 30 ml/min)  Weekly,   R     Comments: while on enoxaparin therapy   Question:  Specimen collection method  Answer:  Lab=Lab collect   08/24/22 1852   08/24/22 1131  Urine Culture  (Undifferentiated presentation (screening labs and basic nursing orders))  ONCE - URGENT,   URGENT       Question:  Indication  Answer:  Altered mental status (if no other cause identified)   08/24/22 1131   Unscheduled  Basic metabolic panel  Tomorrow morning,   R       Question:  Specimen collection method  Answer:  Lab=Lab collect   08/25/22 1018   Unscheduled  CBC with Differential/Platelet  Tomorrow morning,   R       Question:  Specimen collection method  Answer:  Lab=Lab collect   08/25/22 1018   Unscheduled  Magnesium  Tomorrow morning,   R       Question:  Specimen collection method  Answer:  Lab=Lab collect   08/25/22 1018   Unscheduled  Phosphorus  Tomorrow morning,   R       Question:  Specimen collection method  Answer:  Lab=Lab collect   08/25/22 1018            Signed, Terrilee Croak, MD [facility] 08/25/2022

## 2022-08-25 NOTE — Progress Notes (Signed)
Patient's wife wanted to inform me that patient was not acting himself. She stated he shakes and that is something new for him. She stated he does have a history of MS but it has not bothered him or limited his mobility. She showed me that she skin around his nephrostomy tube is swollen which I noticed it was swollen, but not red or warm to touch.  I sat him up on the side of the bed and immediately he started to shake. He stated that he had a headache and that his legs went numb. He was shaking all over but then his upper body did calm down after a minute of sitting up. This position was not comfortable for the patient and he wanted to lay back down. Wife and patient stated they did not feel like he was able to get up to the chair.

## 2022-08-25 NOTE — NC FL2 (Signed)
[place] MEDICAID FL2 LEVEL OF CARE SCREENING TOOL     IDENTIFICATION  Patient Name: Jordan Ward Birthdate: 03-29-1941 Sex: male Admission Date (Current Location): 08/24/2022  [facility] and [place] Number:  [employer] and Address:  [facility] [address], [place]      Provider Number: [id number]  Attending Physician Name and Address:  Terrilee Croak, MD  Relative Name and Phone Number:       Current Level of Care: Hospital Recommended Level of Care: [place] Prior Approval Number:    Date Approved/Denied:   PASRR Number: [id number]  Discharge Plan: SNF    Current Diagnoses: Patient Active Problem List   Diagnosis Date Noted   Hypokalemia 05/19/2022   Pressure ulcer, heel 07/14/2021   Rigors 03/07/2021   Hydronephrosis, left 02/13/2021   Encounter for removal of vascular catheter 09/14/2020   Acute respiratory failure with hypoxia ([place]) 07/12/2020   Pneumonia due to COVID-19 virus 07/12/2020   Encephalopathy due to COVID-19 virus 07/12/2020   Acute hypoxemic respiratory failure due to COVID-19 ([place]) 07/03/2020   Calculus of ureter 05/15/2020   Bacteremia due to Gram-negative bacteria 05/04/2020   Acute lower UTI 05/04/2020   Acute unilateral obstructive uropathy    SIRS (systemic inflammatory response syndrome) ([place]) 05/03/2020   Hydronephrosis 05/39/7673   Complication of cystostomy catheter, subsequent encounter 04/20/2020   Acute kidney injury superimposed on CKD ([place]) 03/02/2020   Dehydration 03/02/2020   Nausea and vomiting 03/02/2020   Ventral hernia without obstruction or gangrene 03/01/2020   Hypotension 03/01/2020   Poor appetite 03/01/2020   Urinary tract infection 03/01/2020   Detrusor areflexia 09/16/2019   Cerebrovascular disease 08/17/2018   Partial small bowel obstruction ([place]) 07/11/2017   Choledocholithiasis 41/93/7902   Complicated UTI (urinary tract infection)    Polymicrobial  septicemia ([place]) 06/14/2017   Paroxysmal atrial tachycardia 06/14/2017   Hypophosphatemia 06/14/2017   Pressure injury of skin 05/26/2017   Hydronephrosis due to obstruction of ureter 05/26/2017   Obstructive uropathy 05/25/2017   CKD (chronic kidney disease), stage III ([place]) 08/23/2016   Hydronephrosis of left kidney 08/23/2016   Kidney stones 09/13/2015   Essential hypertension    Pressure ulcer 08/04/2015   Chronic diastolic (congestive) heart failure ([place]) 08/04/2015   Hydronephrosis with obstructing calculus 08/04/2015   Occult blood positive stool 09/05/2014   Rash and nonspecific skin eruption 08/25/2014   Edema 08/19/2014   Subacute confusional state 08/06/2014   Dilated cardiomyopathy ([place]) 07/21/2014   Sepsis ([place]) 07/17/2014   Severe sepsis ([place]) 07/17/2014   Headache 03/13/2014   CVA (cerebral infarction) 03/13/2014   Abnormality of gait 03/13/2014   Tremors of nervous system 10/08/2011   Hypothyroidism 10/08/2011   Pulmonary nodule 10/08/2011   Dysphagia 10/07/2011   HTN (hypertension), malignant 10/06/2011   Chronic suprapubic catheter ([place]) 10/06/2011   Pulmonary fibrosis ([place]) 10/06/2011   UTI (urinary tract infection) 10/03/2011   CAP (community acquired pneumonia) 10/02/2011   Junctional rhythm 10/02/2011   Multiple sclerosis ([place]) 10/02/2011   Chronic diarrhea 06/17/2011   Hx of adenomatous colonic polyps 06/17/2011   High grade dysplasia in colonic adenoma 09/29/2005    Orientation RESPIRATION BLADDER Height & Weight     Self, Time, Situation, Place  O2 (see dc summary) Continent Weight: 115 lb (52.2 kg) Height:  '5\' 5"'$  (165.1 cm)  BEHAVIORAL SYMPTOMS/MOOD NEUROLOGICAL BOWEL NUTRITION STATUS      Continent Diet (see dc summary)  AMBULATORY STATUS COMMUNICATION OF NEEDS Skin  Extensive Assist Verbally Normal                       Personal Care Assistance Level of Assistance  Bathing, Feeding, Dressing, Total care Bathing Assistance: Limited  assistance Feeding assistance: Independent Dressing Assistance: Limited assistance     Functional Limitations Info  Sight, Hearing, Speech Sight Info: Adequate Hearing Info: Adequate Speech Info: Adequate    SPECIAL CARE FACTORS FREQUENCY  PT (By licensed PT), OT (By licensed OT)     PT Frequency: 5x week OT Frequency: 3x week            Contractures Contractures Info: Not present    Additional Factors Info  Code Status, Allergies Code Status Info: Full Allergies Info: Tetracyclines, Cipro           Current Medications (08/25/2022):  This is the current hospital active medication list Current Facility-Administered Medications  Medication Dose Route Frequency Provider Last Rate Last Admin   0.9 %  sodium chloride infusion   Intravenous Continuous Dahal, Binaya, MD 75 mL/hr at 08/25/22 1118 New Bag at 08/25/22 1118   acetaminophen (TYLENOL) tablet 650 mg  650 mg Oral Q6H PRN Kathie Dike, MD       Or   acetaminophen (TYLENOL) suppository 650 mg  650 mg Rectal Q6H PRN Kathie Dike, MD       amLODipine (NORVASC) tablet 5 mg  5 mg Oral QPM Memon, Jolaine Artist, MD       ceFEPIme (MAXIPIME) 1 g in sodium chloride 0.9 % 100 mL IVPB  1 g Intravenous Q24H Kathie Dike, MD 200 mL/hr at 08/25/22 1513 1 g at 08/25/22 1513   diazepam (VALIUM) tablet 5 mg  5 mg Oral BID Kathie Dike, MD   5 mg at 08/25/22 1120   enoxaparin (LOVENOX) injection 30 mg  30 mg Subcutaneous Q24H Dahal, Marlowe Aschoff, MD       HYDROcodone-acetaminophen (NORCO) 7.5-325 MG per tablet 1 tablet  1 tablet Oral BID Kathie Dike, MD   1 tablet at 08/25/22 1120   levothyroxine (SYNTHROID) tablet 75 mcg  75 mcg Oral q AM Kathie Dike, MD   75 mcg at 08/25/22 0553   ondansetron (ZOFRAN) tablet 4 mg  4 mg Oral Q6H PRN Kathie Dike, MD       Or   ondansetron (ZOFRAN) injection 4 mg  4 mg Intravenous Q6H PRN Memon, Jolaine Artist, MD       polyethylene glycol 0.4% and propylene glycol 0.3% (SYSTANE) ophthalmic gel    Both Eyes QID PRN Kathie Dike, MD       potassium chloride SA (KLOR-CON M) CR tablet 20 mEq  20 mEq Oral QPM Kathie Dike, MD   20 mEq at 08/24/22 2029     Discharge Medications: Please see discharge summary for a list of discharge medications.  Relevant Imaging Results:  Relevant Lab Results:   Additional Information SSN: [id number], [place]

## 2022-08-25 NOTE — TOC Progression Note (Incomplete)
  Transition of Care [facility]) Screening Note   Patient Details  Name: Jordan Ward Date of Birth: 28-Mar-1941   Transition of Care [facility]) CM/SW Contact:    Shade Flood, LCSW Phone Number: 08/25/2022, 9:50 AM    Transition of Care Department [facility]) has reviewed patient and no TOC needs have been identified at this time. We will continue to monitor patient advancement through interdisciplinary progression rounds. If new patient transition needs arise, please place a TOC consult.

## 2022-08-25 NOTE — TOC Initial Note (Signed)
Transition of Care [facility]) - Initial/Assessment Note    Patient Details  Name: Jordan Ward MRN: [id number] Date of Birth: [birth year]  Transition [facility] Care [facility]) CM/SW Contact:    Shade Flood, LCSW Phone Number: 08/25/2022, 1:08 PM  Clinical Narrative:                  Pt admitted from home. Pt active with [facility] RN prior to admission. PT eval pending.  Anticipating pt will return home with [facility] resumption at dc. TOC will follow.   Expected Discharge Plan: [place] Barriers to Discharge: Continued Medical Work up   Patient Goals and CMS Choice Patient states their goals for this hospitalization and ongoing recovery are:: go home CMS Medicare.gov Compare Post Acute Care list provided to:: Patient    Expected Discharge Plan and Services Expected Discharge Plan: [place] In-house Referral: Clinical Social Work   Post Acute Care Choice: Resumption of Svcs/PTA Provider Living arrangements for the past 2 months: Single Family Home                                      Prior Living Arrangements/Services Living arrangements for the past 2 months: Single Family Home Lives with:: Spouse Patient language and need for interpreter reviewed:: Yes Do you feel safe going back to the place where you live?: Yes      Need for Family Participation in Patient Care: No (Comment) Care giver support system in place?: Yes (comment) Current home services: Home RN Criminal Activity/Legal Involvement Pertinent to Current Situation/Hospitalization: No - Comment as needed  Activities of Daily Living Home Assistive Devices/Equipment: Walker (specify type) ADL Screening (condition at time of admission) Patient's cognitive ability adequate to safely complete daily activities?: Yes Is the patient deaf or have difficulty hearing?: No Does the patient have difficulty seeing, even when wearing glasses/contacts?: No Does the patient have difficulty  concentrating, remembering, or making decisions?: No Patient able to express need for assistance with ADLs?: Yes Does the patient have difficulty dressing or bathing?: No Independently performs ADLs?: Yes (appropriate for developmental age) Does the patient have difficulty walking or climbing stairs?: Yes Weakness of Legs: Both Weakness of Arms/Hands: None  Permission Sought/Granted                  Emotional Assessment       Orientation: : Oriented to Self, Oriented to Place, Oriented to  Time, Oriented to Situation Alcohol / Substance Use: Not Applicable Psych Involvement: No (comment)  Admission diagnosis:  Dehydration [E86.0] Hypokalemia [E87.6] UTI (urinary tract infection) [N39.0] Pyelonephritis [N12] Patient Active Problem List   Diagnosis Date Noted   Hypokalemia 05/19/2022   Pressure ulcer, heel 07/14/2021   Rigors 03/07/2021   Hydronephrosis, left 02/13/2021   Encounter for removal of vascular catheter 09/14/2020   Acute respiratory failure with hypoxia ([place]) 07/12/2020   Pneumonia due to COVID-19 virus 07/12/2020   Encephalopathy due to COVID-19 virus 07/12/2020   Acute hypoxemic respiratory failure due to COVID-19 ([place]) 07/03/2020   Calculus of ureter 05/15/2020   Bacteremia due to Gram-negative bacteria 05/04/2020   Acute lower UTI 05/04/2020   Acute unilateral obstructive uropathy    SIRS (systemic inflammatory response syndrome) ([place]) 05/03/2020   Hydronephrosis 53/64/6803   Complication of cystostomy catheter, subsequent encounter 04/20/2020   Acute kidney injury superimposed on CKD ([place]) 03/02/2020   Dehydration 03/02/2020  Nausea and vomiting 03/02/2020   Ventral hernia without obstruction or gangrene 03/01/2020   Hypotension 03/01/2020   Poor appetite 03/01/2020   Urinary tract infection 03/01/2020   Detrusor areflexia 09/16/2019   Cerebrovascular disease 08/17/2018   Partial small bowel obstruction ([place]) 07/11/2017   Choledocholithiasis  36/68/1594   Complicated UTI (urinary tract infection)    Polymicrobial septicemia ([facility]) 06/14/2017   Paroxysmal atrial tachycardia 06/14/2017   Hypophosphatemia 06/14/2017   Pressure injury of skin 05/26/2017   Hydronephrosis due to obstruction of ureter 05/26/2017   Obstructive uropathy 05/25/2017   CKD (chronic kidney disease), stage III ([place]) 08/23/2016   Hydronephrosis of left kidney 08/23/2016   Kidney stones 09/13/2015   Essential hypertension    Pressure ulcer 08/04/2015   Chronic diastolic (congestive) heart failure ([place]) 08/04/2015   Hydronephrosis with obstructing calculus 08/04/2015   Occult blood positive stool 09/05/2014   Rash and nonspecific skin eruption 08/25/2014   Edema 08/19/2014   Subacute confusional state 08/06/2014   Dilated cardiomyopathy ([place]) 07/21/2014   Sepsis ([place]) 07/17/2014   Severe sepsis ([place]) 07/17/2014   Headache 03/13/2014   CVA (cerebral infarction) 03/13/2014   Abnormality of gait 03/13/2014   Tremors of nervous system 10/08/2011   Hypothyroidism 10/08/2011   Pulmonary nodule 10/08/2011   Dysphagia 10/07/2011   HTN (hypertension), malignant 10/06/2011   Chronic suprapubic catheter ([place]) 10/06/2011   Pulmonary fibrosis ([place]) 10/06/2011   UTI (urinary tract infection) 10/03/2011   CAP (community acquired pneumonia) 10/02/2011   Junctional rhythm 10/02/2011   Multiple sclerosis ([place]) 10/02/2011   Chronic diarrhea 06/17/2011   Hx of adenomatous colonic polyps 06/17/2011   High grade dysplasia in colonic adenoma 09/29/2005   PCP:  Valentino Nose, FNP Pharmacy:   [facility] [place] [address] Phone: [phone] Fax: [phone]     Social Determinants of Health (SDOH) Interventions    Readmission Risk Interventions    07/15/2021    9:54 AM 02/15/2021   11:47 AM 09/06/2020    1:26 PM  Readmission Risk Prevention Plan  Transportation Screening   Complete  PCP or  Specialist Appt within 5-7 Days  Complete   PCP or Specialist Appt within 3-5 Days Complete    Home Care Screening  Complete   Medication Review (RN CM)  Complete   HRI or Home Care Consult Complete  Complete  Social Work Consult for [employer] Planning/Counseling Complete  Complete  Palliative Care Screening Complete  Not Applicable  Medication Review [employer]) Complete  Complete

## 2022-08-25 NOTE — TOC Progression Note (Signed)
Transition of Care [facility]) - Progression Note    Patient Details  Name: ADEYEMI HAMAD MRN: [id number] Date of Birth: November 10, 1940  Transition of Care [facility]) CM/SW [place], [place] Phone Number: 08/25/2022, 3:59 PM  Clinical Narrative:    TOC updated that PT is recommending SNF at D/C. CSW spoke with pt who states that he is agreeable to referral being sent out. Pt would like to speak with his wife about what he should do at D/C. TOC to send out SNF referral and follow up with pt.   Expected Discharge Plan: [place] Barriers to Discharge: Continued Medical Work up  Expected Discharge Plan and Services Expected Discharge Plan: [place] In-house Referral: Clinical Social Work   Post Acute Care Choice: Resumption of Svcs/PTA Provider Living arrangements for the past 2 months: Single Family Home                                       Social Determinants of Health (SDOH) Interventions    Readmission Risk Interventions    08/25/2022    3:58 PM 07/15/2021    9:54 AM 02/15/2021   11:47 AM  Readmission Risk Prevention Plan  Transportation Screening Complete    PCP or Specialist Appt within 5-7 Days   Complete  PCP or Specialist Appt within 3-5 Days  Complete   Home Care Screening Complete  Complete  Medication Review (RN CM) Complete  Complete  HRI or [place]  Complete   Social Work Consult for [place] Planning/Counseling  Complete   Palliative Care Screening  Complete   Medication Review Press [employer])  Complete

## 2022-08-26 ENCOUNTER — Telehealth: Payer: Self-pay | Admitting: Neurology

## 2022-08-26 DIAGNOSIS — N390 Urinary tract infection, site not specified: Secondary | ICD-10-CM | POA: Diagnosis not present

## 2022-08-26 DIAGNOSIS — T83510A Infection and inflammatory reaction due to cystostomy catheter, initial encounter: Secondary | ICD-10-CM | POA: Diagnosis not present

## 2022-08-26 LAB — BASIC METABOLIC PANEL
Anion gap: 3 — ABNORMAL LOW (ref 5–15)
BUN: 16 mg/dL (ref 8–23)
CO2: 24 mmol/L (ref 22–32)
Calcium: 8.1 mg/dL — ABNORMAL LOW (ref 8.9–10.3)
Chloride: 112 mmol/L — ABNORMAL HIGH (ref 98–111)
Creatinine, Ser: 1.57 mg/dL — ABNORMAL HIGH (ref 0.61–1.24)
GFR, Estimated: 45 mL/min — ABNORMAL LOW (ref >60)
Glucose, Bld: 92 mg/dL (ref 70–99)
Potassium: 3.2 mmol/L — ABNORMAL LOW (ref 3.5–5.1)
Sodium: 139 mmol/L (ref 135–145)

## 2022-08-26 LAB — CBC WITH DIFFERENTIAL/PLATELET
Abs Immature Granulocytes: 0.01 10*3/uL (ref 0.00–0.07)
Basophils Absolute: 0 10*3/uL (ref 0.0–0.1)
Basophils Relative: 1 %
Eosinophils Absolute: 0.2 10*3/uL (ref 0.0–0.5)
Eosinophils Relative: 3 %
HCT: 36.9 % — ABNORMAL LOW (ref 39.0–52.0)
Hemoglobin: 12.1 g/dL — ABNORMAL LOW (ref 13.0–17.0)
Immature Granulocytes: 0 %
Lymphocytes Relative: 46 %
Lymphs Abs: 3.1 10*3/uL (ref 0.7–4.0)
MCH: 30.3 pg (ref 26.0–34.0)
MCHC: 32.8 g/dL (ref 30.0–36.0)
MCV: 92.5 fL (ref 80.0–100.0)
Monocytes Absolute: 0.5 10*3/uL (ref 0.1–1.0)
Monocytes Relative: 7 %
Neutro Abs: 2.9 10*3/uL (ref 1.7–7.7)
Neutrophils Relative %: 43 %
Platelets: 155 10*3/uL (ref 150–400)
RBC: 3.99 MIL/uL — ABNORMAL LOW (ref 4.22–5.81)
RDW: 14.2 % (ref 11.5–15.5)
WBC: 6.7 10*3/uL (ref 4.0–10.5)
nRBC: 0 % (ref 0.0–0.2)

## 2022-08-26 LAB — MAGNESIUM: Magnesium: 1.9 mg/dL (ref 1.7–2.4)

## 2022-08-26 LAB — PHOSPHORUS: Phosphorus: 2.5 mg/dL (ref 2.5–4.6)

## 2022-08-26 MED ORDER — POLYETHYL GLYCOL-PROPYL GLYCOL 0.4-0.3 % OP GEL
Freq: Four times a day (QID) | OPHTHALMIC | Status: DC
  Filled 2022-08-26: qty 10

## 2022-08-26 MED ORDER — SODIUM CHLORIDE 0.9 % IV SOLN
1.0000 g | Freq: Three times a day (TID) | INTRAVENOUS | Status: DC
  Administered 2022-08-26 – 2022-08-30 (×12): 1 g via INTRAVENOUS
  Filled 2022-08-26 (×16): qty 1000

## 2022-08-26 MED ORDER — POTASSIUM CHLORIDE CRYS ER 20 MEQ PO TBCR
40.0000 meq | EXTENDED_RELEASE_TABLET | Freq: Once | ORAL | Status: AC
  Administered 2022-08-26: 40 meq via ORAL
  Filled 2022-08-26: qty 2

## 2022-08-26 MED ORDER — POLYVINYL ALCOHOL 1.4 % OP SOLN
1.0000 [drp] | Freq: Four times a day (QID) | OPHTHALMIC | Status: DC
  Administered 2022-08-26 – 2022-08-30 (×15): 1 [drp] via OPHTHALMIC
  Filled 2022-08-26 (×2): qty 15

## 2022-08-26 NOTE — Evaluation (Signed)
Occupational Therapy Evaluation Patient Details Name: Jordan Ward MRN: [id number] DOB: 1941-05-01 Today's Date: 08/26/2022   History of Present Illness Jordan Ward is a 81 y.o. male with medical history significant of multiple sclerosis, neurogenic bladder status post suprapubic catheter, left percutaneous nephrostomy tube, reports that he was in his usual state of health when this morning he was going to the bathroom to empty his nephrostomy bag.  He became increasingly dizzy.  Wife reports that he began shaking.  Denies any recent vomiting, diarrhea, shortness of breath, fever.  Reports that he does have a chronic cough with some p.o. intake, but this has not changed recently.  He was brought to the hospital for evaluation.  Blood pressure noted to be elevated.  Initial labs show significant hypokalemia.  His creatinine is mildly trended up.  Urinalysis indicated possible infection.  He was started on antibiotics and IV fluids.   Clinical Impression   Pt agreeable to OT and PT co-evaluation. Pt very dizzy today. Poor sitting balance with need for constant physical assist to remain upright. Moderate assistance for transfer to chair with RW. Pt very unsteady in standing. Symptoms possibly getting worse with visual tracking in superior fields at midline based on pt's response to testing. Pt primary deficit is dizziness that is impairing balance seated and standing. Pt will benefit from continued OT in the hospital and recommended venue below to increase strength, balance, and endurance for safe ADL's.        Recommendations for follow up therapy are one component of a multi-disciplinary discharge planning process, led by the attending physician.  Recommendations may be updated based on patient status, additional functional criteria and insurance authorization.   Follow Up Recommendations  Skilled nursing-short term rehab (<3 hours/day)     Assistance Recommended at Discharge Intermittent  Supervision/Assistance  Patient can return home with the following A lot of help with walking and/or transfers;A lot of help with bathing/dressing/bathroom;Assistance with cooking/housework;Assist for transportation;Help with stairs or ramp for entrance    Functional Status Assessment  Patient has had a recent decline in their functional status and demonstrates the ability to make significant improvements in function in a reasonable and predictable amount of time.  Equipment Recommendations  None recommended by OT    Recommendations for Other Services       Precautions / Restrictions Precautions Precautions: Fall Restrictions Weight Bearing Restrictions: No      Mobility Bed Mobility Overal bed mobility: Needs Assistance Bed Mobility: Supine to Sit     Supine to sit: Min assist, Mod assist     General bed mobility comments: increased time, labored movement, need for physical assist    Transfers Overall transfer level: Needs assistance Equipment used: Rolling walker (2 wheels) Transfers: Sit to/from Stand, Bed to chair/wheelchair/BSC Sit to Stand: Mod assist     Step pivot transfers: Mod assist     General transfer comment: Pt unsteady with continued dizziness when standing. Able to take steps to R side and sit in chair with assist.      Balance Overall balance assessment: Needs assistance Sitting-balance support: Feet supported, Bilateral upper extremity supported Sitting balance-Leahy Scale: Poor Sitting balance - Comments: seated at EOB   Standing balance support: Reliant on assistive device for balance, During functional activity, Bilateral upper extremity supported Standing balance-Leahy Scale: Poor Standing balance comment: fair/poor mostly due to c/o dizziness/room spinning  ADL either performed or assessed with clinical judgement   ADL Overall ADL's : Needs assistance/impaired     Grooming: Standing;Moderate  assistance;Maximal assistance       Lower Body Bathing: Moderate assistance;Maximal assistance;Sitting/lateral leans   Upper Body Dressing : Set up;Sitting   Lower Body Dressing: Maximal assistance;Sitting/lateral leans Lower Body Dressing Details (indicate cue type and reason): Pt unable to doff socks while seated at EOB today. Toilet Transfer: Moderate assistance;Stand-pivot;Rolling walker (2 wheels) Toilet Transfer Details (indicate cue type and reason): Simulated via EOB to chait transfer with RW. Toileting- Clothing Manipulation and Hygiene: Moderate assistance;Sitting/lateral lean;Maximal assistance;Bed level Toileting - Clothing Manipulation Details (indicate cue type and reason): Based on unsteady sitting balance.     Functional mobility during ADLs: Moderate assistance;Maximal assistance;Rollator (4 wheels)       Vision Baseline Vision/History: 1 Wears glasses Ability to See in Adequate Light: 0 Adequate Patient Visual Report: Other (comment) (Dizziness causing the room to spin.) Vision Assessment?: Yes Additional Comments: Pt struggled with vertical tracking at midline. Pt noted to look away when pen was in suprior field at midline. Likely increasing pt's dizziness based on observed response.                Pertinent Vitals/Pain Pain Assessment Pain Assessment: Faces Faces Pain Scale: Hurts a little bit Pain Location: "where my tube goes in" Pain Descriptors / Indicators: Grimacing Pain Intervention(s): Limited activity within patient's tolerance, Monitored during session, Repositioned     Hand Dominance Right   Extremity/Trunk Assessment Upper Extremity Assessment Upper Extremity Assessment: Generalized weakness   Lower Extremity Assessment Lower Extremity Assessment: Defer to PT evaluation   Cervical / Trunk Assessment Cervical / Trunk Assessment: Normal   Communication Communication Communication: No difficulties   Cognition Arousal/Alertness:  Awake/alert Behavior During Therapy: WFL for tasks assessed/performed Overall Cognitive Status: Within Functional Limits for tasks assessed                                                        Home Living Family/patient expects to be discharged to:: Private residence Living Arrangements: Spouse/significant other;Children Available Help at Discharge: Family;Available PRN/intermittently Type of Home: House Home Access: Stairs to enter [employer] of Steps: 2 Entrance Stairs-Rails: Right Home Layout: One level     Bathroom Shower/Tub: Sponge bathes at baseline   Bathroom Toilet: Standard Bathroom Accessibility: No   Home Equipment: Conservation officer, nature (2 wheels);Cane - single point;BSC/3in1   Additional Comments: Per PT note      Prior Functioning/Environment Prior Level of Function : Needs assist       Physical Assist : ADLs (physical);Mobility (physical) Mobility (physical): Bed mobility;Transfers;Gait;Stairs ADLs (physical): IADLs Mobility Comments: [place] ambulator with RW ADLs Comments: Independent ADL; family assists with IADL's        OT Problem List: Decreased strength;Decreased activity tolerance;Impaired balance (sitting and/or standing);Impaired vision/perception      OT Treatment/Interventions: Self-care/ADL training;Therapeutic exercise;Therapeutic activities;Patient/family education;Balance training    OT Goals(Current goals can be found in the care plan section) Acute Rehab OT Goals Patient Stated Goal: Resolve dizziness. OT Goal Formulation: With patient Time For Goal Achievement: 09/09/22 Potential to Achieve Goals: Fair  OT Frequency: Min 2X/week    Co-evaluation PT/OT/SLP Co-Evaluation/Treatment: Yes Reason for Co-Treatment: To address functional/ADL transfers   OT goals addressed during session: ADL's and self-care  End of Session Equipment Utilized During Treatment: Rolling  walker (2 wheels)  Activity Tolerance: Patient tolerated treatment well Patient left: in chair;with call bell/phone within reach  OT Visit Diagnosis: Unsteadiness on feet (R26.81);Other abnormalities of gait and mobility (R26.89)                Time: 1552-0802 OT Time Calculation (min): 16 min Charges:  OT General Charges $OT Visit: 1 Visit OT Evaluation $OT Eval Low Complexity: 1 Low  Chole Driver OT, MOT  Larey Seat 08/26/2022, 9:55 AM

## 2022-08-26 NOTE — Telephone Encounter (Addendum)
Pt's wife, Jordan Ward asking to speak with someone to help understand MS and if in rehab he starts to get worse can she bring him in a  ambulance to see [name] or [name], NP?. [name] said, pt will be transferred to rehab by Thursday.   Informed [name] need to schedule an appt after pt is discharged from rehab. Scheduled appt 10/02/22 at 10:15 am.

## 2022-08-26 NOTE — Plan of Care (Signed)
  Problem: Acute Rehab OT Goals (only OT should resolve) Goal: Pt. Will Perform Grooming Flowsheets (Taken 08/26/2022 0958) Pt Will Perform Grooming:  with supervision  standing Goal: Pt. Will Perform Lower Body Bathing Flowsheets (Taken 08/26/2022 0958) Pt Will Perform Lower Body Bathing:  with supervision  sitting/lateral leans Goal: Pt. Will Perform Lower Body Dressing Flowsheets (Taken 08/26/2022 0958) Pt Will Perform Lower Body Dressing:  with supervision  sitting/lateral leans Goal: Pt. Will Transfer To Toilet Flowsheets (Taken 08/26/2022 [phone]) Pt Will Transfer to Toilet:  with supervision  stand pivot transfer Goal: Pt. Will Perform Toileting-Clothing Manipulation Flowsheets (Taken 08/26/2022 0958) Pt Will Perform Toileting - Clothing Manipulation and hygiene:  with supervision  sitting/lateral leans Goal: Pt/Caregiver Will Perform Home Exercise Program Flowsheets (Taken 08/26/2022 [phone]) Pt/caregiver will Perform Home Exercise Program:  Increased strength  Both right and left upper extremity  Independently  Missi Mcmackin OT, MOT

## 2022-08-26 NOTE — Telephone Encounter (Signed)
I reviewed note. Pt previously seen by Dr. Jannifer Franklin last office visit 11/20/2021 with SS, NP. He is still admitted to [facility].  We will need to get the pt in for a office visit after he is discharged. We will discuss MS symptoms at the time of the visit. Please call and schedule for office visit with SS, NP or [name].  Thank you.

## 2022-08-26 NOTE — Progress Notes (Signed)
No acute events overnight. Jordan Ward Jaimey Franchini,RN 

## 2022-08-26 NOTE — Progress Notes (Signed)
Physical Therapy Treatment Patient Details Name: Jordan Ward MRN: [id number] DOB: 08/15/1941 Today's Date: 08/26/2022   History of Present Illness Jordan Ward is a 81 y.o. male with medical history significant of multiple sclerosis, neurogenic bladder status post suprapubic catheter, left percutaneous nephrostomy tube, reports that he was in his usual state of health when this morning he was going to the bathroom to empty his nephrostomy bag.  He became increasingly dizzy.  Wife reports that he began shaking.  Denies any recent vomiting, diarrhea, shortness of breath, fever.  Reports that he does have a chronic cough with some p.o. intake, but this has not changed recently.  He was brought to the hospital for evaluation.  Blood pressure noted to be elevated.  Initial labs show significant hypokalemia.  His creatinine is mildly trended up.  Urinalysis indicated possible infection.  He was started on antibiotics and IV fluids.    PT Comments    Patient continues to c/o severe dizziness when sitting up at bedside and has difficulty maintaining sitting balance due to c/o spinning sensation, able to take a few slow labored sides at bedside during transfer to chair, but unable to walk away from bedside due to fall risk.  Patient tolerated sitting up in chair after therapy - nursing staff notified.  Patient will benefit from continued skilled physical therapy in hospital and recommended venue below to increase strength, balance, endurance for safe ADLs and gait.     Recommendations for follow up therapy are one component of a multi-disciplinary discharge planning process, led by the attending physician.  Recommendations may be updated based on patient status, additional functional criteria and insurance authorization.  Follow Up Recommendations  Skilled nursing-short term rehab (<3 hours/day) Can patient physically be transported by private vehicle: No   Assistance Recommended at Discharge Intermittent  Supervision/Assistance  Patient can return home with the following A lot of help with bathing/dressing/bathroom;A lot of help with walking and/or transfers;Help with stairs or ramp for entrance;Assistance with cooking/housework   Equipment Recommendations  None recommended by PT    Recommendations for Other Services       Precautions / Restrictions Precautions Precautions: Fall Restrictions Weight Bearing Restrictions: No     Mobility  Bed Mobility Overal bed mobility: Needs Assistance Bed Mobility: Supine to Sit     Supine to sit: Min assist, Mod assist Sit to supine: Min guard, Min assist   General bed mobility comments: labored movement, increased time, frequent loss of balance once seated due to c/o dizziness    Transfers Overall transfer level: Needs assistance Equipment used: Rolling walker (2 wheels) Transfers: Sit to/from Stand, Bed to chair/wheelchair/BSC Sit to Stand: Mod assist   Step pivot transfers: Mod assist       General transfer comment: unsteady labored movement, c/o severe dizziness    Ambulation/Gait Ambulation/Gait assistance: Mod assist Gait Distance (Feet): 3 Feet Assistive device: Rolling walker (2 wheels) Gait Pattern/deviations: Decreased step length - right, Decreased step length - left, Decreased stride length Gait velocity: slow     General Gait Details: limited to a few slow labored side steps with difficulty advancing BLE due to weakness and limited mostly due to c/o severe dizziness   Stairs             Wheelchair Mobility    Modified Rankin (Stroke Patients Only)       Balance Overall balance assessment: Needs assistance Sitting-balance support: Feet supported, No upper extremity supported Sitting balance-Leahy Scale: Poor Sitting balance -  Comments: seated at EOB   Standing balance support: Reliant on assistive device for balance, During functional activity, Bilateral upper extremity supported Standing  balance-Leahy Scale: Poor Standing balance comment: fair/poor mostly due to c/o dizziness/room spinning                            Cognition Arousal/Alertness: Awake/alert Behavior During Therapy: WFL for tasks assessed/performed Overall Cognitive Status: Within Functional Limits for tasks assessed                                          Exercises      General Comments        Pertinent Vitals/Pain Pain Assessment Pain Assessment: Faces Faces Pain Scale: Hurts a little bit Pain Location: "where my tube goes in" Pain Descriptors / Indicators: Grimacing Pain Intervention(s): Limited activity within patient's tolerance, Monitored during session, Repositioned    Home Living Family/patient expects to be discharged to:: Private residence Living Arrangements: Spouse/significant other;Children Available Help at Discharge: Family;Available PRN/intermittently Type of Home: House Home Access: Stairs to enter Entrance Stairs-Rails: Right Entrance Stairs-Number of Steps: 2   Home Layout: One level Home Equipment: Conservation officer, nature (2 wheels);Cane - single point;BSC/3in1 Additional Comments: Per PT note    Prior Function            PT Goals (current goals can now be found in the care plan section) Acute Rehab PT Goals Patient Stated Goal: return home with family to assist PT Goal Formulation: With patient Time For Goal Achievement: 09/08/22 Potential to Achieve Goals: Good Progress towards PT goals: Progressing toward goals    Frequency    Min 3X/week      PT Plan Current plan remains appropriate    Co-evaluation PT/OT/SLP Co-Evaluation/Treatment: Yes Reason for Co-Treatment: Complexity of the patient's impairments (multi-system involvement);To address functional/ADL transfers PT goals addressed during session: Mobility/safety with mobility;Balance;Proper use of DME OT goals addressed during session: ADL's and self-care      AM-PAC PT  "6 Clicks" Mobility   Outcome Measure  Help needed turning from your back to your side while in a flat bed without using bedrails?: A Little Help needed moving from lying on your back to sitting on the side of a flat bed without using bedrails?: A Lot Help needed moving to and from a bed to a chair (including a wheelchair)?: A Lot Help needed standing up from a chair using your arms (e.g., wheelchair or bedside chair)?: A Lot Help needed to walk in hospital room?: A Lot Help needed climbing 3-5 steps with a railing? : Total 6 Click Score: 12    End of Session   Activity Tolerance: Patient tolerated treatment well;Patient limited by fatigue;Other (comment) (limited due to dizziness) Patient left: in chair;with call bell/phone within reach;with chair alarm set Nurse Communication: Mobility status PT Visit Diagnosis: Unsteadiness on feet (R26.81);Other abnormalities of gait and mobility (R26.89);Muscle weakness (generalized) (M62.81)     Time: 4166-0630 PT Time Calculation (min) (ACUTE ONLY): 17 min  Charges:  $Therapeutic Activity: 8-22 mins                     11:56 AM, 08/26/22 Lonell Grandchild, MPT Physical Therapist with [facility] [phone] office [phone] mobile phone

## 2022-08-26 NOTE — TOC Progression Note (Addendum)
Transition of Care [facility]) - Progression Note    Patient Details  Name: Jordan Ward MRN: [id number] Date of Birth: 11/27/1940  Transition of Care [facility]) CM/SW [place], [place] Phone Number: 08/26/2022, 12:30 PM  Clinical Narrative:    CSW spoke with pts spouse about SNF recommendation. Pt and wide spoke about recommendation last night. CSW went over SNF bed offers. Pts spouse states that she needs to speak with pt, MD and RN before making decision. TOC to follow.   Expected Discharge Plan: [place] Barriers to Discharge: Continued Medical Work up  Expected Discharge Plan and Services Expected Discharge Plan: [place] In-house Referral: Clinical Social Work   Post Acute Care Choice: Resumption of Svcs/PTA Provider Living arrangements for the past 2 months: Single Family Home                                       Social Determinants of Health (SDOH) Interventions    Readmission Risk Interventions    08/25/2022    3:58 PM 07/15/2021    9:54 AM 02/15/2021   11:47 AM  Readmission Risk Prevention Plan  Transportation Screening Complete    PCP or Specialist Appt within 5-7 Days   Complete  PCP or Specialist Appt within 3-5 Days  Complete   Home Care Screening Complete  Complete  Medication Review (RN CM) Complete  Complete  HRI or [place]  Complete   Social Work Consult for [place] Planning/Counseling  Complete   Palliative Care Screening  Complete   Medication Review Press photographer)  Complete

## 2022-08-26 NOTE — Progress Notes (Signed)
PROGRESS NOTE  Jordan Ward  DOB: 02/27/1941  PCP: Valentino Nose, [place] XBD:[id number]  DOA: 08/24/2022  LOS: 1 day  Hospital Day: 3  Brief narrative: Jordan Ward is a 81 y.o. male with PMH significant for multiple sclerosis with neurogenic bladder s/p suprapubic catheter, left percutaneous nephrostomy tube in place, HTN, HLD, CAD, PAD, paroxysmal atrial tachycardia, stroke with left-sided weakness, chronic anemia, arthritis, chronic back pain, hypothyroidism, OSA 11/26, patient woke up altered in the morning.  Family noted strong foul-smelling urine.  He also had an episode of dizziness, shaking and hence brought to the ED.  In the ED, he was afebrile, blood pressure elevated to 170s, breathing on room air Initial workup showed unremarkable CBC, CMP with potassium low at 2.8, BUN/creatinine elevated to 31/2.02, troponin mildly elevated at 19, lactic acid normal at 1.1. Respiratory panel negative for COVID, flu Urinalysis showed cloudy yellow urine with moderate amount of hemoglobin, large leukocytes, positive nitrate, few bacteria Urine culture was sent CT abdomen pelvis did not show any acute findings.  It showed bilateral nephrolithiasis with left percutaneous nephrostomy tube in place. It also showed chronic interstitial lung disease. Patient was started on empiric IV antibiotics.  Subjective: Patient was seen and examined this morning.   Sitting up in chair.  Not in distress.  His big complaint at this time is irritation in his eyes.   No family at bedside.  Assessment and plan: UTI a/w chronic suprapubic catheter Presented with foul-smelling urine, dizziness Urinalysis as above suggestive of infection.  Urine culture is growing more than 1000 CFU per mL of gram-positive cocci.  Pending sensitivity. Currently on empiric IV cefepime.  Dizziness on any movement Likely because of dehydration.  Need to rule out BPPV as well.  Continue to monitor with hydration.  PT/OT eval  ordered.  Multiple sclerosis with neurogenic bladder Suprapubic catheter in place For MS, patient does not seem to be on any disease modifying agents Continue supportive management with pain management and benzodiazepines Followup with [facility]  Bilateral nephrolithiasis  chronic Left percutaneous nephrostomy  AKI on CKD 3b Baseline creatinine 1.69.  Presented with creatinine elevated 2.02. No evidence of obstruction in imaging. Creatinine improving with hydration.  Can stop IV fluid today Recent Labs    05/18/22 2145 05/19/22 0648 05/20/22 0435 05/21/22 0519 05/22/22 0631 08/24/22 1116 08/25/22 0502 08/26/22 0405  BUN 29* 28* 30* 23 27* 31* 19 16  CREATININE 2.35* 2.26* 1.92* 1.58* 1.69* 2.02* 1.54* 1.57*   Hypokalemia On daily potassium supplement.  Potassium low at 3.2 this morning.  Extra replacement given.  Recent Labs  Lab 08/24/22 1116 08/24/22 1601 08/25/22 0502 08/26/22 0405  K 2.8*  --  3.0* 3.2*  MG  --  2.2  --  1.9  PHOS  --   --   --  2.5   Hypothyroidism Continue Synthroid   Dysphagia Reports that he normally has to have his food chopped up, but does tolerate thin liquids.  Needs to have large pills taken with applesauce Currently on dysphagia 2 diet   Hypertension Continue home dose of Norvasc   Abnormal femoral head finding Imaging on admission showed increased sclerosis within the right femoral head appears similar to the prior exam and may reflect early avascular necrosis.   Generalized weakness Normally ambulates with a walker PT/OT obtained.  SNF recommended  Eye irritation No photophobia or new visual impairment on exam.  Resume eyedrops this morning   Goals of care  Code Status: Full Code    Mobility: Encourage ambulation.    Infusions:   ceFEPime (MAXIPIME) IV Stopped (08/25/22 1544)    Scheduled Meds:  amLODipine  5 mg Oral QPM   diazepam  5 mg Oral BID   enoxaparin (LOVENOX) injection  30 mg  Subcutaneous Q24H   HYDROcodone-acetaminophen  1 tablet Oral BID   levothyroxine  75 mcg Oral q AM   Polyethyl Glycol-Propyl Glycol   Both Eyes QID   potassium chloride SA  20 mEq Oral QPM    PRN meds: acetaminophen **OR** acetaminophen, ondansetron **OR** ondansetron (ZOFRAN) IV   Skin assessment:     Nutritional status:  Body mass index is 19.14 kg/m.          Diet:  Diet Order             DIET DYS 2 Room service appropriate? Yes; Fluid consistency: Thin  Diet effective now                   DVT prophylaxis:  enoxaparin (LOVENOX) injection 30 mg Start: 08/25/22 2000   Antimicrobials: IV cefepime Fluid: Stop IV fluid today Consultants: None Family Communication: None at bedside  Status is: Observation  Continue inhospital care because: Pending urine culture sensitivity report.  Remains on IV antibiotics Level of care: Telemetry   Dispo: The patient is from: Home              Anticipated d/c is to: SNF              Patient currently is not medically stable to d/c.   Difficult to place patient No       Antimicrobials: Anti-infectives (From admission, onward)    Start     Dose/Rate Route Frequency Ordered Stop   08/25/22 1400  ceFEPIme (MAXIPIME) 1 g in sodium chloride 0.9 % 100 mL IVPB        1 g 200 mL/hr over 30 Minutes Intravenous Every 24 hours 08/24/22 2054     08/24/22 1300  ceFEPIme (MAXIPIME) 2 g in sodium chloride 0.9 % 100 mL IVPB        2 g 200 mL/hr over 30 Minutes Intravenous  Once 08/24/22 1247 08/24/22 1324       Objective: Vitals:   08/26/22 0339 08/26/22 0841  BP: 124/75 119/73  Pulse: (!) 59 62  Resp: 14   Temp: 97.7 F (36.5 C) 98 F (36.7 C)  SpO2: 100% 97%    Intake/Output Summary (Last 24 hours) at 08/26/2022 1126 Last data filed at 08/26/2022 1012 Gross per 24 hour  Intake 1435.49 ml  Output 3125 ml  Net -1689.51 ml   Filed Weights   08/24/22 1114  Weight: 52.2 kg   Weight change:  Body mass index is  19.14 kg/m.   Physical Exam: General exam: Pleasant, elderly.  Not in pain Skin: No rashes, lesions or ulcers. HEENT: Atraumatic, normocephalic, no obvious bleeding.  No photophobia or new visual impairment on exam Lungs: Clear to auscultation bilaterally CVS: Regular rate and rhythm, no murmur. GI/Abd soft, nontender, nondistended, bowel sound present.  Suprapubic catheter and left nephrostomy catheter in place CNS: Alert, awake, oriented x 3 Psychiatry: Mood appropriate Extremities: No pedal edema, no calf tenderness  Data Review: I have personally reviewed the laboratory data and studies available.  F/u labs ordered Unresulted Labs (From admission, onward)     Start     Ordered   08/31/22 0500  Creatinine, serum  (enoxaparin (LOVENOX)  CrCl >/= 30 ml/min)  Weekly,   R     Comments: while on enoxaparin therapy   Question:  Specimen collection method  Answer:  Lab=Lab collect   08/24/22 1852            Signed, Terrilee Croak, MD [facility] 08/26/2022

## 2022-08-26 NOTE — Telephone Encounter (Signed)
Pt's wife, Darell Saputo (on [place]) Sunday called EMS;pt started shaking and was not responsive (lasted a minute). Pt is in hospital for UTI. Pt starts shaking when they sit him up.  Hospital seem to think shaking is contributed to his MS. Physician recommending nursing/rehab. Can Sarah,NP call back to discuss MS symptoms

## 2022-08-27 DIAGNOSIS — G35 Multiple sclerosis: Secondary | ICD-10-CM

## 2022-08-27 DIAGNOSIS — I10 Essential (primary) hypertension: Secondary | ICD-10-CM

## 2022-08-27 DIAGNOSIS — Z9359 Other cystostomy status: Secondary | ICD-10-CM | POA: Diagnosis not present

## 2022-08-27 DIAGNOSIS — T83510A Infection and inflammatory reaction due to cystostomy catheter, initial encounter: Secondary | ICD-10-CM | POA: Diagnosis not present

## 2022-08-27 DIAGNOSIS — E039 Hypothyroidism, unspecified: Secondary | ICD-10-CM

## 2022-08-27 DIAGNOSIS — N179 Acute kidney failure, unspecified: Secondary | ICD-10-CM | POA: Diagnosis not present

## 2022-08-27 NOTE — Progress Notes (Addendum)
PROGRESS NOTE    Jordan Ward  WGN:[id number] DOB: [birth year] DOA: 08/24/2022 PCP: Leone Payor, FNP   Brief Narrative:  81 y.o. male with PMH significant for multiple sclerosis with neurogenic bladder s/p suprapubic catheter, left percutaneous nephrostomy tube in place, HTN, HLD, CAD, PAD, paroxysmal atrial tachycardia, stroke with left-sided weakness, chronic anemia, arthritis, chronic back pain, hypothyroidism, OSA presented with altered mental status, foul-smelling urine and dizziness and shaking.  On presentation, he was afebrile with creatinine of 2.02, lactic acid of 1.1.  COVID testing and influenza were negative.  UA was suggestive of UTI.  CT abdomen and pelvis did not show any acute findings: Showed bilateral nephrolithiasis with left percutaneous nephrostomy tube in place.  He was started on IV antibiotics.  Assessment & Plan:   UTI associated with chronic suprapubic catheter/left percutaneous nephrostomy -Urine culture growing Enterococcus.  Follow sensitivities.  Antibiotics have been changed to ampicillin IV  Multiple sclerosis with neurogenic bladder with suprapubic catheter and left percutaneous nephrostomy tube in place Bilateral nephrolithiasis -Not on any disease modifying agents for MS.  Outpatient follow-up with neurology -Outpatient follow-up with neurology -Continue supportive management with pain management and benzodiazepines  AKI on CKD stage IIIb -Baseline creatinine 1.69.  Presented with creatinine elevated 2.02. -No evidence of obstruction in imaging. -Creatinine improving with hydration.  Repeat a.m. labs.  Labs today.  Off IV fluids  Hypokalemia -No labs today.  Repeat a.m. labs continue supplementation  Hypothyroidism -Continue levothyroxine  Hypertension -Continue amlodipine  Dysphagia -Reports that he normally has to have his food chopped up, but does tolerate thin liquids.  Needs to have large pills taken with applesauce -Currently on  dysphagia 2 diet  Abnormal femoral head finding -Imaging on admission showed increased sclerosis within the right femoral head appears similar to the prior exam and may reflect early avascular necrosis.  Outpatient follow-up   Generalized weakness -Normally ambulates with a walker -PT/OT recommend SNF placement.  TOC following.  Eye irritation -No photophobia or new visual impairment on exam.  Resume eyedrops.  Outpatient follow-up with ophthalmology if persistent symptoms  DVT prophylaxis: Lovenox Code Status: Full Family Communication: None at bedside Disposition Plan: Status is: Inpatient Remains inpatient appropriate because: Of severity of illness.  Need for SNF placement.  Consultants: None  Procedures: None  Antimicrobials:  Anti-infectives (From admission, onward)    Start     Dose/Rate Route Frequency Ordered Stop   08/26/22 1430  ampicillin (OMNIPEN) 1 g in sodium chloride 0.9 % 100 mL IVPB        1 g 300 mL/hr over 20 Minutes Intravenous Every 8 hours 08/26/22 1344     08/25/22 1400  ceFEPIme (MAXIPIME) 1 g in sodium chloride 0.9 % 100 mL IVPB  Status:  Discontinued        1 g 200 mL/hr over 30 Minutes Intravenous Every 24 hours 08/24/22 2054 08/26/22 1344   08/24/22 1300  ceFEPIme (MAXIPIME) 2 g in sodium chloride 0.9 % 100 mL IVPB        2 g 200 mL/hr over 30 Minutes Intravenous  Once 08/24/22 1247 08/24/22 1324        Subjective: Patient seen and examined at bedside.  Feels weak.  No fever, vomiting, agitation reported.  Objective: Vitals:   08/26/22 0841 08/26/22 1300 08/26/22 2135 08/27/22 0324  BP: 119/73 106/69 (!) 138/90 132/79  Pulse: 62 64 (!) 59 (!) 59  Resp:   20 20  Temp: 98 F (36.7 C) 98.2 F (36.8  C) 98.4 F (36.9 C) 97.7 F (36.5 C)  TempSrc: Oral Oral Oral Oral  SpO2: 97% 100% 98% 100%  Weight:      Height:        Intake/Output Summary (Last 24 hours) at 08/27/2022 1137 Last data filed at 08/27/2022 0900 Gross per 24 hour   Intake 1160 ml  Output 3500 ml  Net -2340 ml   Filed Weights   08/24/22 1114  Weight: 52.2 kg    Examination:  General exam: Appears calm and comfortable.  Chronically ill and deconditioned. Respiratory system: Bilateral decreased breath sounds at bases with some scattered crackles Cardiovascular system: S1 & S2 heard, mild intermittent bradycardia present Gastrointestinal system: Abdomen is nondistended, soft and nontender. Normal bowel sounds heard. Extremities: No cyanosis, clubbing, edema  Central nervous system: Alert and oriented.  Slow to respond.  Speech is not very well comprehensible.  No focal neurological deficits. Moving extremities Skin: No rashes, lesions or ulcers Psychiatry: Flat affect.  No signs of agitation.   Data Reviewed: I have personally reviewed following labs and imaging studies  CBC: Recent Labs  Lab 08/24/22 1116 08/25/22 0502 08/26/22 0405  WBC 7.3 6.4 6.7  NEUTROABS  --   --  2.9  HGB 13.7 12.1* 12.1*  HCT 41.3 37.1* 36.9*  MCV 91.6 91.6 92.5  PLT 169 150 155   Basic Metabolic Panel: Recent Labs  Lab 08/24/22 1116 08/24/22 1601 08/25/22 0502 08/26/22 0405  NA 145  --  141 139  K 2.8*  --  3.0* 3.2*  CL 108  --  112* 112*  CO2 29  --  24 24  GLUCOSE 109*  --  89 92  BUN 31*  --  19 16  CREATININE 2.02*  --  1.54* 1.57*  CALCIUM 8.7*  --  7.9* 8.1*  MG  --  2.2  --  1.9  PHOS  --   --   --  2.5   GFR: Estimated Creatinine Clearance: 28.2 mL/min (A) (by C-G formula based on SCr of 1.57 mg/dL (H)). Liver Function Tests: Recent Labs  Lab 08/24/22 1116 08/25/22 0502  AST 17 15  ALT 10 9  ALKPHOS 121 101  BILITOT 0.8 0.7  PROT 7.6 6.1*  ALBUMIN 3.9 3.3*   No results for input(s): "LIPASE", "AMYLASE" in the last 168 hours. No results for input(s): "AMMONIA" in the last 168 hours. Coagulation Profile: Recent Labs  Lab 08/24/22 1116  INR 1.0   Cardiac Enzymes: No results for input(s): "CKTOTAL", "CKMB", "CKMBINDEX",  "TROPONINI" in the last 168 hours. BNP (last 3 results) No results for input(s): "PROBNP" in the last 8760 hours. HbA1C: No results for input(s): "HGBA1C" in the last 72 hours. CBG: No results for input(s): "GLUCAP" in the last 168 hours. Lipid Profile: No results for input(s): "CHOL", "HDL", "LDLCALC", "TRIG", "CHOLHDL", "LDLDIRECT" in the last 72 hours. Thyroid Function Tests: No results for input(s): "TSH", "T4TOTAL", "FREET4", "T3FREE", "THYROIDAB" in the last 72 hours. Anemia Panel: No results for input(s): "VITAMINB12", "FOLATE", "FERRITIN", "TIBC", "IRON", "RETICCTPCT" in the last 72 hours. Sepsis Labs: Recent Labs  Lab 08/24/22 1203  LATICACIDVEN 1.1    Recent Results (from the past 240 hour(s))  Urine Culture     Status: Abnormal (Preliminary result)   Collection Time: 08/24/22 11:22 AM   Specimen: Urine, Catheterized  Result Value Ref Range Status   Specimen Description   Final    URINE, CATHETERIZED Performed at [facility], [address] [place], [place]  [address]    Special Requests   Final    NONE Performed at [facility], [address], [place], [place] [address]    Culture (A)  Final    >=100,000 COLONIES/mL ENTEROCOCCUS FAECALIS CONFIRMATION OF SUSCEPTIBILITIES IN PROGRESS Performed at [facility], [address], [place], [place] [address]    Report Status PENDING  Incomplete  Blood Culture (routine x 2)     Status: None (Preliminary result)   Collection Time: 08/24/22 11:36 AM   Specimen: BLOOD RIGHT HAND  Result Value Ref Range Status   Specimen Description   Final    BLOOD RIGHT HAND BOTTLES DRAWN AEROBIC AND ANAEROBIC   Special Requests Blood Culture adequate volume  Final   Culture   Final    NO GROWTH < 12 HOURS Performed at [facility], [address], [place], [place] [address]    Report Status PENDING  Incomplete  Resp Panel by RT-PCR (Flu A&B, Covid) Anterior Nasal Swab     Status: None   Collection Time: 08/24/22 12:05 PM    Specimen: Anterior Nasal Swab  Result Value Ref Range Status   SARS Coronavirus 2 by RT PCR NEGATIVE NEGATIVE Final    Comment: (NOTE) SARS-CoV-2 target nucleic acids are NOT DETECTED.  The SARS-CoV-2 RNA is generally detectable in upper respiratory specimens during the acute phase of infection. The lowest concentration of SARS-CoV-2 viral copies this assay can detect is 138 copies/mL. A negative result does not preclude SARS-Cov-2 infection and should not be used as the sole basis for treatment or other patient management decisions. A negative result may occur with  improper specimen collection/handling, submission of specimen other than nasopharyngeal swab, presence of viral mutation(s) within the areas targeted by this assay, and inadequate number of viral copies(<138 copies/mL). A negative result must be combined with clinical observations, patient history, and epidemiological information. The expected result is Negative.  Fact Sheet for Patients:  BloggerCourse.com  Fact Sheet for Healthcare Providers:  SeriousBroker.it  This test is no t yet approved or cleared by the [place] FDA and  has been authorized for detection and/or diagnosis of SARS-CoV-2 by FDA under an Emergency Use Authorization (EUA). This EUA will remain  in effect (meaning this test can be used) for the duration of the COVID-19 declaration under Section 564(b)(1) of the Act, 21 U.S.C.section 360bbb-3(b)(1), unless the authorization is terminated  or revoked sooner.       Influenza A by PCR NEGATIVE NEGATIVE Final   Influenza B by PCR NEGATIVE NEGATIVE Final    Comment: (NOTE) The Xpert Xpress SARS-CoV-2/FLU/RSV plus assay is intended as an aid in the diagnosis of influenza from Nasopharyngeal swab specimens and should not be used as a sole basis for treatment. Nasal washings and aspirates are unacceptable for Xpert Xpress  SARS-CoV-2/FLU/RSV testing.  Fact Sheet for Patients: BloggerCourse.com  Fact Sheet for Healthcare Providers: SeriousBroker.it  This test is not yet approved or cleared by the [place] FDA and has been authorized for detection and/or diagnosis of SARS-CoV-2 by FDA under an Emergency Use Authorization (EUA). This EUA will remain in effect (meaning this test can be used) for the duration of the COVID-19 declaration under Section 564(b)(1) of the Act, 21 U.S.C. section 360bbb-3(b)(1), unless the authorization is terminated or revoked.  Performed at [facility], [address], [place], [place] [address]   Blood Culture (routine x 2)     Status: None (Preliminary result)   Collection Time: 08/24/22  1:34 PM  Specimen: BLOOD LEFT HAND  Result Value Ref Range Status   Specimen Description BLOOD LEFT HAND  Final   Special Requests   Final    BOTTLES DRAWN AEROBIC AND ANAEROBIC Blood Culture adequate volume   Culture   Final    NO GROWTH < 12 HOURS Performed at [facility], [address], [place], [place] [address]    Report Status PENDING  Incomplete         Radiology Studies: No results found.      Scheduled Meds:  amLODipine  5 mg Oral QPM   diazepam  5 mg Oral BID   enoxaparin (LOVENOX) injection  30 mg Subcutaneous Q24H   HYDROcodone-acetaminophen  1 tablet Oral BID   levothyroxine  75 mcg Oral q AM   polyvinyl alcohol  1 drop Both Eyes QID   potassium chloride SA  20 mEq Oral QPM   Continuous Infusions:  ampicillin (OMNIPEN) IV 1 g (08/27/22 0424)          Glade Lloyd, MD [facility] 08/27/2022, 11:37 AM

## 2022-08-27 NOTE — Progress Notes (Signed)
Pt requests back to bed from chair. States ambulated entire hallway with PT staff and tolerated well. Pt able to stand on his own and transfer back to bed, did have to encourage pt to stand tall and tuck his buttocks, other wise tolerated well.

## 2022-08-27 NOTE — Progress Notes (Signed)
Physical Therapy Treatment Patient Details Name: Jordan Ward MRN: [id number] DOB: 29-Oct-1940 Today's Date: 08/27/2022   History of Present Illness Jordan Ward is a 81 y.o. male with medical history significant of multiple sclerosis, neurogenic bladder status post suprapubic catheter, left percutaneous nephrostomy tube, reports that he was in his usual state of health when this morning he was going to the bathroom to empty his nephrostomy bag.  He became increasingly dizzy.  Wife reports that he began shaking.  Denies any recent vomiting, diarrhea, shortness of breath, fever.  Reports that he does have a chronic cough with some p.o. intake, but this has not changed recently.  He was brought to the hospital for evaluation.  Blood pressure noted to be elevated.  Initial labs show significant hypokalemia.  His creatinine is mildly trended up.  Urinalysis indicated possible infection.  He was started on antibiotics and IV fluids.    PT Comments    Pt activity tolerance is much better today, pt might be able to return home with the assist of [place]. Pt sitting up in a chair since 10:00 and desires to stay up until 1600, therefore pt would be able to be transported by vehicle.    Recommendations for follow up therapy are one component of a multi-disciplinary discharge planning process, led by the attending physician.  Recommendations may be updated based on patient status, additional functional criteria and insurance authorization.  Follow Up Recommendations  Home health PT Can patient physically be transported by private vehicle: Yes   Assistance Recommended at Discharge    Patient can return home with the following A little help with walking and/or transfers;Assistance with cooking/housework;Help with stairs or ramp for entrance   Equipment Recommendations  None recommended by PT    Recommendations for Other Services       Precautions / Restrictions Precautions Precautions: Fall Precaution  Comments: Pt states he sometimes becomes light headed when he is up. Restrictions Weight Bearing Restrictions: No     Mobility  Bed Mobility               General bed mobility comments: pt is up in a chair when therapist enters the room and does not desire to go back to bed.    Transfers   Equipment used: Rolling walker (2 wheels) Transfers: Sit to/from Stand, Bed to chair/wheelchair/BSC Sit to Stand: Modified independent (Device/Increase time)   Step pivot transfers: Modified independent (Device/Increase time)            Ambulation/Gait Ambulation/Gait assistance: Modified independent (Device/Increase time) Gait Distance (Feet): 80 Feet Assistive device: Rolling walker (2 wheels)         General Gait Details: no complaint of dizziness this session          Balance Overall balance assessment: Needs assistance Sitting-balance support: Feet supported, No upper extremity supported Sitting balance-Leahy Scale: Good Sitting balance - Comments: seated at EOB   Standing balance support: Reliant on assistive device for balance, During functional activity, Bilateral upper extremity supported Standing balance-Leahy Scale: Fair Standing balance comment: no dizziness                            Cognition Arousal/Alertness: Awake/alert Behavior During Therapy: WFL for tasks assessed/performed Overall Cognitive Status: Within Functional Limits for tasks assessed  Exercises General Exercises - Lower Extremity Ankle Circles/Pumps: Both, 10 reps, Seated Long Arc Quad: Strengthening, 10 reps, Seated Hip Flexion/Marching: Both, 5 reps Heel Raises: 5 reps, 10 reps Mini-Sqauts: 10 reps    General Comments        Pertinent Vitals/Pain Pain Assessment Pain Assessment: No/denies pain           PT Goals (current goals can now be found in the care plan section)      Frequency    Min  3X/week      PT Plan  Continue with balance and strengthening        AM-PAC PT "6 Clicks" Mobility   Outcome Measure  Help needed turning from your back to your side while in a flat bed without using bedrails?: A Little Help needed moving from lying on your back to sitting on the side of a flat bed without using bedrails?: A Little Help needed moving to and from a bed to a chair (including a wheelchair)?: A Little Help needed standing up from a chair using your arms (e.g., wheelchair or bedside chair)?: A Little Help needed to walk in hospital room?: A Little Help needed climbing 3-5 steps with a railing? : A Lot 6 Click Score: 17    End of Session Equipment Utilized During Treatment: Gait belt Activity Tolerance: Patient tolerated treatment well;Patient limited by fatigue;Other (comment) Patient left: in chair;with call bell/phone within reach;with chair alarm set Nurse Communication: Mobility status PT Visit Diagnosis: Unsteadiness on feet (R26.81);Other abnormalities of gait and mobility (R26.89);Muscle weakness (generalized) (M62.81)     Time: 1400-1430 PT Time Calculation (min) (ACUTE ONLY): 30 min  Charges:  $Gait Training: 8-22 mins $Therapeutic Exercise: 8-22 mins                     Rayetta Humphrey, PT CLT [phone]  08/27/2022, 2:36 PM

## 2022-08-27 NOTE — Progress Notes (Signed)
MD Alekh in to see and evaluate pt. Pt alert and oriented, denies any c/o.

## 2022-08-27 NOTE — Telephone Encounter (Addendum)
I called the pt's wife back and we discussed message. She reports pt has tremors worsening since hsp admission.   She reports she will bring pt as planned for January and will let us know if any changes are noticeable she will call back and we will try and work the pt in sooner.

## 2022-08-27 NOTE — TOC Progression Note (Signed)
Transition of Care [facility]) - Progression Note    Patient Details  Name: Jordan Ward MRN: [id number] Date of Birth: [birth year]  Transition of Care [facility]) CM/SW Contact  Shade Flood, LCSW Phone Number: 08/27/2022, 12:07 PM  Clinical Narrative:     TOC following. Spoke with pt's wife to review bed offers and CMS SNF star ratings. Wife states she will review the information with pt and they will make a decision. She asks that [facility] call her tomorrow AM.  Expected Discharge Plan: [place] Barriers to Discharge: Continued Medical Work up  Expected Discharge Plan and Services Expected Discharge Plan: [place] In-house Referral: Clinical Social Work   Post Acute Care Choice: Resumption of Svcs/PTA Provider Living arrangements for the past 2 months: Single Family Home                                       Social Determinants of Health (SDOH) Interventions    Readmission Risk Interventions    08/25/2022    3:58 PM 07/15/2021    9:54 AM 02/15/2021   11:47 AM  Readmission Risk Prevention Plan  Transportation Screening Complete    PCP or Specialist Appt within 5-7 Days   Complete  PCP or Specialist Appt within 3-5 Days  Complete   Home Care Screening Complete  Complete  Medication Review (RN CM) Complete  Complete  HRI or [place]  Complete   Social Work Consult for [place] Planning/Counseling  Complete   Palliative Care Screening  Complete   Medication Review Press photographer)  Complete

## 2022-08-28 DIAGNOSIS — R1310 Dysphagia, unspecified: Secondary | ICD-10-CM | POA: Diagnosis not present

## 2022-08-28 DIAGNOSIS — T83510A Infection and inflammatory reaction due to cystostomy catheter, initial encounter: Secondary | ICD-10-CM | POA: Diagnosis not present

## 2022-08-28 DIAGNOSIS — G35 Multiple sclerosis: Secondary | ICD-10-CM | POA: Diagnosis not present

## 2022-08-28 DIAGNOSIS — N179 Acute kidney failure, unspecified: Secondary | ICD-10-CM | POA: Diagnosis not present

## 2022-08-28 LAB — BASIC METABOLIC PANEL
Anion gap: 7 (ref 5–15)
BUN: 20 mg/dL (ref 8–23)
CO2: 26 mmol/L (ref 22–32)
Calcium: 8.6 mg/dL — ABNORMAL LOW (ref 8.9–10.3)
Chloride: 106 mmol/L (ref 98–111)
Creatinine, Ser: 1.74 mg/dL — ABNORMAL HIGH (ref 0.61–1.24)
GFR, Estimated: 39 mL/min — ABNORMAL LOW (ref >60)
Glucose, Bld: 94 mg/dL (ref 70–99)
Potassium: 3.3 mmol/L — ABNORMAL LOW (ref 3.5–5.1)
Sodium: 139 mmol/L (ref 135–145)

## 2022-08-28 LAB — MAGNESIUM: Magnesium: 2.1 mg/dL (ref 1.7–2.4)

## 2022-08-28 MED ORDER — DIAZEPAM 5 MG PO TABS: 5.0000 mg | ORAL_TABLET | Freq: Two times a day (BID) | ORAL | 0 refills | Status: DC

## 2022-08-28 MED ORDER — POTASSIUM CHLORIDE CRYS ER 20 MEQ PO TBCR
40.0000 meq | EXTENDED_RELEASE_TABLET | Freq: Once | ORAL | Status: AC
  Administered 2022-08-28: 40 meq via ORAL
  Filled 2022-08-28: qty 2

## 2022-08-28 MED ORDER — HYDROCODONE-ACETAMINOPHEN 7.5-325 MG PO TABS: 1.0000 | ORAL_TABLET | Freq: Two times a day (BID) | ORAL | 0 refills | Status: DC

## 2022-08-28 NOTE — Progress Notes (Signed)
PROGRESS NOTE    Jordan Ward  KJZ:[id number] DOB: [birth year] DOA: 08/24/2022 PCP: [place]ino Nose, FNP   Brief Narrative:  81 y.o. male with PMH significant for multiple sclerosis with neurogenic bladder s/p suprapubic catheter, left percutaneous nephrostomy tube in place, HTN, HLD, CAD, PAD, paroxysmal atrial tachycardia, stroke with left-sided weakness, chronic anemia, arthritis, chronic back pain, hypothyroidism, OSA presented with altered mental status, foul-smelling urine and dizziness and shaking.  On presentation, he was afebrile with creatinine of 2.02, lactic acid of 1.1.  COVID testing and influenza were negative.  UA was suggestive of UTI.  CT abdomen and pelvis did not show any acute findings: Showed bilateral nephrolithiasis with left percutaneous nephrostomy tube in place.  He was started on IV antibiotics.  Assessment & Plan:   UTI associated with chronic suprapubic catheter/left percutaneous nephrostomy -Urine culture growing Enterococcus.  Follow sensitivities.  Antibiotics have been changed to ampicillin IV  Multiple sclerosis with neurogenic bladder with suprapubic catheter and left percutaneous nephrostomy tube in place Bilateral nephrolithiasis -Not on any disease modifying agents for MS.  Outpatient follow-up with neurology -Outpatient follow-up with neurology -Continue supportive management with pain management and benzodiazepines  AKI on CKD stage IIIb -Baseline creatinine 1.69.  Presented with creatinine elevated 2.02. -No evidence of obstruction in imaging. -Creatinine 1.74.  Repeat a.m. labs.  Off IV fluids  Hypokalemia -Replace.  Repeat a.m. labs   Hypothyroidism -Continue levothyroxine  Hypertension -Continue amlodipine  Dysphagia -Reports that he normally has to have his food chopped up, but does tolerate thin liquids.  Needs to have large pills taken with applesauce -Currently on dysphagia 2 diet  Abnormal femoral head finding -Imaging on  admission showed increased sclerosis within the right femoral head appears similar to the prior exam and may reflect early avascular necrosis.  Outpatient follow-up   Generalized weakness -Normally ambulates with a walker -PT/OT initially recommending SNF placement but recommended home health PT on 08/27/2022.  Patient not sure if his wife would be able to take care of him at home.  TOC following.  Eye irritation -No photophobia or new visual impairment on exam.  Resume eyedrops.  Outpatient follow-up with ophthalmology if persistent symptoms  DVT prophylaxis: Lovenox Code Status: Full Family Communication: Wife on phone on 08/28/2022 disposition Plan: Status is: Inpatient Remains inpatient appropriate because: Of severity of illness, need for IV antibiotics.  Need for SNF placement.  Possible discharge tomorrow on oral antibiotics.  Consultants: None  Procedures: None  Antimicrobials:  Anti-infectives (From admission, onward)    Start     Dose/Rate Route Frequency Ordered Stop   08/26/22 1430  ampicillin (OMNIPEN) 1 g in sodium chloride 0.9 % 100 mL IVPB        1 g 300 mL/hr over 20 Minutes Intravenous Every 8 hours 08/26/22 1344     08/25/22 1400  ceFEPIme (MAXIPIME) 1 g in sodium chloride 0.9 % 100 mL IVPB  Status:  Discontinued        1 g 200 mL/hr over 30 Minutes Intravenous Every 24 hours 08/24/22 2054 08/26/22 1344   08/24/22 1300  ceFEPIme (MAXIPIME) 2 g in sodium chloride 0.9 % 100 mL IVPB        2 g 200 mL/hr over 30 Minutes Intravenous  Once 08/24/22 1247 08/24/22 1324        Subjective: Patient seen and examined at bedside.  Still feels weak, does not think that wife will be able to take care of him at home.  No vomiting,  chest pain, fever reported. Objective: Vitals:   08/27/22 0324 08/27/22 1500 08/27/22 2216 08/28/22 0412  BP: 132/79 130/75 (!) 148/77 136/62  Pulse: (!) 59 64 68 (!) 57  Resp: '20 18 19 18  '$ Temp: 97.7 F (36.5 C) 97.8 F (36.6 C) 98.2 F  (36.8 C) 97.6 F (36.4 C)  TempSrc: Oral Oral Oral   SpO2: 100% 99% 98% 99%  Weight:    52.1 kg  Height:        Intake/Output Summary (Last 24 hours) at 08/28/2022 1121 Last data filed at 08/28/2022 0024 Gross per 24 hour  Intake 480 ml  Output 2100 ml  Net -1620 ml    Filed Weights   08/24/22 1114 08/28/22 0412  Weight: 52.2 kg 52.1 kg    Examination:  General: On room air.  No distress.  Looks chronically ill and deconditioned. ENT/neck: No thyromegaly.  JVD is not elevated  respiratory: Decreased breath sounds at bases bilaterally with some crackles; no wheezing  CVS: S1-S2 heard, rate controlled currently Abdominal: Soft, nontender, slightly distended; no organomegaly, bowel sounds are heard Extremities: Trace lower extremity edema; no cyanosis  CNS: Awake and alert.  Still slow to respond.  Speech is still not very well comprehensible.  No focal neurologic deficit.  Moves extremities Lymph: No obvious lymphadenopathy Skin: No obvious ecchymosis/lesions  psych: Not agitated.  Flat affect  musculoskeletal: No obvious joint swelling/deformity    Data Reviewed: I have personally reviewed following labs and imaging studies  CBC: Recent Labs  Lab 08/24/22 1116 08/25/22 0502 08/26/22 0405  WBC 7.3 6.4 6.7  NEUTROABS  --   --  2.9  HGB 13.7 12.1* 12.1*  HCT 41.3 37.1* 36.9*  MCV 91.6 91.6 92.5  PLT 169 150 814    Basic Metabolic Panel: Recent Labs  Lab 08/24/22 1116 08/24/22 1601 08/25/22 0502 08/26/22 0405 08/28/22 0500  NA 145  --  141 139 139  K 2.8*  --  3.0* 3.2* 3.3*  CL 108  --  112* 112* 106  CO2 29  --  '24 24 26  '$ GLUCOSE 109*  --  89 92 94  BUN 31*  --  '19 16 20  '$ CREATININE 2.02*  --  1.54* 1.57* 1.74*  CALCIUM 8.7*  --  7.9* 8.1* 8.6*  MG  --  2.2  --  1.9 2.1  PHOS  --   --   --  2.5  --     GFR: Estimated Creatinine Clearance: 25.4 mL/min (A) (by C-G formula based on SCr of 1.74 mg/dL (H)). Liver Function Tests: Recent Labs  Lab  08/24/22 1116 08/25/22 0502  AST 17 15  ALT 10 9  ALKPHOS 121 101  BILITOT 0.8 0.7  PROT 7.6 6.1*  ALBUMIN 3.9 3.3*    No results for input(s): "LIPASE", "AMYLASE" in the last 168 hours. No results for input(s): "AMMONIA" in the last 168 hours. Coagulation Profile: Recent Labs  Lab 08/24/22 1116  INR 1.0    Cardiac Enzymes: No results for input(s): "CKTOTAL", "CKMB", "CKMBINDEX", "TROPONINI" in the last 168 hours. BNP (last 3 results) No results for input(s): "PROBNP" in the last 8760 hours. HbA1C: No results for input(s): "HGBA1C" in the last 72 hours. CBG: No results for input(s): "GLUCAP" in the last 168 hours. Lipid Profile: No results for input(s): "CHOL", "HDL", "LDLCALC", "TRIG", "CHOLHDL", "LDLDIRECT" in the last 72 hours. Thyroid Function Tests: No results for input(s): "TSH", "T4TOTAL", "FREET4", "T3FREE", "THYROIDAB" in the last 72 hours. Anemia  Panel: No results for input(s): "VITAMINB12", "FOLATE", "FERRITIN", "TIBC", "IRON", "RETICCTPCT" in the last 72 hours. Sepsis Labs: Recent Labs  Lab 08/24/22 1203  LATICACIDVEN 1.1     Recent Results (from the past 240 hour(s))  Urine Culture     Status: Abnormal (Preliminary result)   Collection Time: 08/24/22 11:22 AM   Specimen: Urine, Catheterized  Result Value Ref Range Status   Specimen Description   Final    URINE, CATHETERIZED Performed at [facility], [address], [place], [place] [address]    Special Requests   Final    NONE Performed at [facility], [address], [place], [place] [address]    Culture (A)  Final    >=100,000 COLONIES/mL ENTEROCOCCUS FAECALIS CULTURE REINCUBATED FOR BETTER GROWTH Performed at [facility], [place] [address] [place], [place] [address]    Report Status PENDING  Incomplete  Blood Culture (routine x 2)     Status: None (Preliminary result)   Collection Time: 08/24/22 11:36 AM   Specimen: BLOOD RIGHT HAND  Result Value Ref Range Status   Specimen  Description   Final    BLOOD RIGHT HAND BOTTLES DRAWN AEROBIC AND ANAEROBIC   Special Requests Blood Culture adequate volume  Final   Culture   Final    NO GROWTH 3 DAYS Performed at [facility], [address] [place], [place] [address]    Report Status PENDING  Incomplete  Resp Panel by RT-PCR (Flu A&B, Covid) Anterior Nasal Swab     Status: None   Collection Time: 08/24/22 12:05 PM   Specimen: Anterior Nasal Swab  Result Value Ref Range Status   SARS Coronavirus 2 by RT PCR NEGATIVE NEGATIVE Final    Comment: (NOTE) SARS-CoV-2 target nucleic acids are NOT DETECTED.  The SARS-CoV-2 RNA is generally detectable in upper respiratory specimens during the acute phase of infection. The lowest concentration of SARS-CoV-2 viral copies this assay can detect is 138 copies/mL. A negative result does not preclude SARS-Cov-2 infection and should not be used as the sole basis for treatment or other patient management decisions. A negative result may occur with  improper specimen collection/handling, submission of specimen other than nasopharyngeal swab, presence of viral mutation(s) within the areas targeted by this assay, and inadequate number of viral copies(<138 copies/mL). A negative result must be combined with clinical observations, patient history, and epidemiological information. The expected result is Negative.  Fact Sheet for Patients:  EntrepreneurPulse.com.au  Fact Sheet for Healthcare Providers:  IncredibleEmployment.be  This test is no t yet approved or cleared by the [place] FDA and  has been authorized for detection and/or diagnosis of SARS-CoV-2 by FDA under an Emergency Use Authorization (EUA). This EUA will remain  in effect (meaning this test can be used) for the duration of the COVID-19 declaration under Section 564(b)(1) of the Act, 21 U.S.C.section 360bbb-3(b)(1), unless the authorization is terminated  or revoked sooner.        Influenza A by PCR NEGATIVE NEGATIVE Final   Influenza B by PCR NEGATIVE NEGATIVE Final    Comment: (NOTE) The Xpert Xpress SARS-CoV-2/FLU/RSV plus assay is intended as an aid in the diagnosis of influenza from Nasopharyngeal swab specimens and should not be used as a sole basis for treatment. Nasal washings and aspirates are unacceptable for Xpert Xpress SARS-CoV-2/FLU/RSV testing.  Fact Sheet for Patients: EntrepreneurPulse.com.au  Fact Sheet for Healthcare Providers: IncredibleEmployment.be  This test is not yet approved or cleared by the [place] and  has been authorized for detection and/or diagnosis of SARS-CoV-2 by FDA under an Emergency Use Authorization (EUA). This EUA will remain in effect (meaning this test can be used) for the duration of the COVID-19 declaration under Section 564(b)(1) of the Act, 21 U.S.C. section 360bbb-3(b)(1), unless the authorization is terminated or revoked.  Performed at [facility], [address] [place], [place] [address]   Blood Culture (routine x 2)     Status: None (Preliminary result)   Collection Time: 08/24/22  1:34 PM   Specimen: BLOOD LEFT HAND  Result Value Ref Range Status   Specimen Description BLOOD LEFT HAND  Final   Special Requests   Final    BOTTLES DRAWN AEROBIC AND ANAEROBIC Blood Culture adequate volume   Culture   Final    NO GROWTH 3 DAYS Performed at [facility], [address] [place], [place] [address]    Report Status PENDING  Incomplete         Radiology Studies: No results found.      Scheduled Meds:  amLODipine  5 mg Oral QPM   diazepam  5 mg Oral BID   enoxaparin (LOVENOX) injection  30 mg Subcutaneous Q24H   HYDROcodone-acetaminophen  1 tablet Oral BID   levothyroxine  75 mcg Oral q AM   polyvinyl alcohol  1 drop Both Eyes QID   Continuous Infusions:  ampicillin (OMNIPEN) IV 1 g (08/28/22 1700)          Aline August,  MD [facility] 08/28/2022, 11:21 AM

## 2022-08-28 NOTE — Progress Notes (Signed)
Physical Therapy Treatment Patient Details Name: DUVAL MACLEOD MRN: [id number] DOB: 16-Jan-1941 Today's Date: 08/28/2022   History of Present Illness TERREL NESHEIWAT is a 81 y.o. male with medical history significant of multiple sclerosis, neurogenic bladder status post suprapubic catheter, left percutaneous nephrostomy tube, reports that he was in his usual state of health when this morning he was going to the bathroom to empty his nephrostomy bag.  He became increasingly dizzy.  Wife reports that he began shaking.  Denies any recent vomiting, diarrhea, shortness of breath, fever.  Reports that he does have a chronic cough with some p.o. intake, but this has not changed recently.  He was brought to the hospital for evaluation.  Blood pressure noted to be elevated.  Initial labs show significant hypokalemia.  His creatinine is mildly trended up.  Urinalysis indicated possible infection.  He was started on antibiotics and IV fluids.    PT Comments    Patient presents seated in chair with c/o 5/10 dizziness, demonstrates improvement for bed mobility and transfers, but has to lean on armrest of chair during transfers without use of RW, slightly labored cadence during gait training without loss of balance and limited mostly due to c/o fatigue and generalized weakness.  Patient tolerated staying up in chair after therapy.  Patient will benefit from continued skilled physical therapy in hospital and recommended venue below to increase strength, balance, endurance for safe ADLs and gait.     Recommendations for follow up therapy are one component of a multi-disciplinary discharge planning process, led by the attending physician.  Recommendations may be updated based on patient status, additional functional criteria and insurance authorization.  Follow Up Recommendations  Skilled nursing-short term rehab (<3 hours/day) Can patient physically be transported by private vehicle: Yes   Assistance Recommended at  Discharge Intermittent Supervision/Assistance  Patient can return home with the following A little help with walking and/or transfers;Assistance with cooking/housework;Help with stairs or ramp for entrance   Equipment Recommendations  None recommended by PT    Recommendations for Other Services       Precautions / Restrictions Precautions Precautions: Fall Restrictions Weight Bearing Restrictions: No     Mobility  Bed Mobility Overal bed mobility: Needs Assistance Bed Mobility: Supine to Sit, Sit to Supine     Supine to sit: Supervision Sit to supine: Supervision   General bed mobility comments: slightly labored movement    Transfers Overall transfer level: Needs assistance Equipment used: Rolling walker (2 wheels) Transfers: Sit to/from Stand, Bed to chair/wheelchair/BSC Sit to Stand: Min guard, Supervision   Step pivot transfers: Supervision, Min guard       General transfer comment: has to lean on armrest for chair for support when transferring without use of RW    Ambulation/Gait Ambulation/Gait assistance: Supervision, Min guard Gait Distance (Feet): 65 Feet Assistive device: Rolling walker (2 wheels) Gait Pattern/deviations: Decreased step length - right, Decreased step length - left, Decreased stride length Gait velocity: decreased     General Gait Details: slow slightly labored cadence with c/o mild dizziness and limited mostly due to c/o fatigue and generalized weakness   Stairs             Wheelchair Mobility    Modified Rankin (Stroke Patients Only)       Balance Overall balance assessment: Needs assistance Sitting-balance support: Feet supported, No upper extremity supported Sitting balance-Leahy Scale: Good Sitting balance - Comments: seated at EOB   Standing balance support: During functional activity, No  upper extremity supported Standing balance-Leahy Scale: Poor Standing balance comment: fair using RW                             Cognition Arousal/Alertness: Awake/alert Behavior During Therapy: WFL for tasks assessed/performed Overall Cognitive Status: Within Functional Limits for tasks assessed                                          Exercises      General Comments        Pertinent Vitals/Pain Pain Assessment Pain Assessment: No/denies pain Pain Location: c/o of 5/10 dizziness Pain Intervention(s): Limited activity within patient's tolerance, Monitored during session    Home Living                          Prior Function            PT Goals (current goals can now be found in the care plan section) Acute Rehab PT Goals Patient Stated Goal: return home with family to assist PT Goal Formulation: With patient Time For Goal Achievement: 09/08/22 Potential to Achieve Goals: Good Progress towards PT goals: Progressing toward goals    Frequency    Min 3X/week      PT Plan Current plan remains appropriate    Co-evaluation              AM-PAC PT "6 Clicks" Mobility   Outcome Measure  Help needed turning from your back to your side while in a flat bed without using bedrails?: A Little Help needed moving from lying on your back to sitting on the side of a flat bed without using bedrails?: A Little Help needed moving to and from a bed to a chair (including a wheelchair)?: A Little Help needed standing up from a chair using your arms (e.g., wheelchair or bedside chair)?: A Little Help needed to walk in hospital room?: A Little Help needed climbing 3-5 steps with a railing? : A Lot 6 Click Score: 17    End of Session   Activity Tolerance: Patient tolerated treatment well;Patient limited by fatigue Patient left: in chair;with call bell/phone within reach Nurse Communication: Mobility status PT Visit Diagnosis: Unsteadiness on feet (R26.81);Other abnormalities of gait and mobility (R26.89);Muscle weakness (generalized) (M62.81)     Time:  3009-2330 PT Time Calculation (min) (ACUTE ONLY): 20 min  Charges:  $Gait Training: 8-22 mins $Therapeutic Activity: 8-22 mins                     2:01 PM, 08/28/22 Lonell Grandchild, MPT Physical Therapist with [facility] 336 [phone] office [phone] mobile phone

## 2022-08-28 NOTE — Progress Notes (Signed)
Occupational Therapy Treatment Patient Details Name: Jordan Ward MRN: [id number] DOB: 1941/08/17 Today's Date: 08/28/2022   History of present illness Jordan Ward is a 81 y.o. male with medical history significant of multiple sclerosis, neurogenic bladder status post suprapubic catheter, left percutaneous nephrostomy tube, reports that he was in his usual state of health when this morning he was going to the bathroom to empty his nephrostomy bag.  He became increasingly dizzy.  Wife reports that he began shaking.  Denies any recent vomiting, diarrhea, shortness of breath, fever.  Reports that he does have a chronic cough with some p.o. intake, but this has not changed recently.  He was brought to the hospital for evaluation.  Blood pressure noted to be elevated.  Initial labs show significant hypokalemia.  His creatinine is mildly trended up.  Urinalysis indicated possible infection.  He was started on antibiotics and IV fluids.   OT comments  Pt agreeable to OT treatment today. Pt reports that he was feeling better yesterday, but that the dizziness has returned today. Despite reports of dizziness pt was able to stand and ambulate to sink and toilet with min G assist using RW today. Pt also was able to doff and don socks without assist while seated in the recliner. Pt reported that he thinks he would be too much of a burden for his wife at this time. SNF recommendation remaining in place, but this seems largely dependent on pt's level of dizziness rather than endurance or strength. Pt will benefit from continued OT in the hospital and recommended venue below to increase strength, balance, and endurance for safe ADL's.      Recommendations for follow up therapy are one component of a multi-disciplinary discharge planning process, led by the attending physician.  Recommendations may be updated based on patient status, additional functional criteria and insurance authorization.    Follow Up  Recommendations  Skilled nursing-short term rehab (<3 hours/day)     Assistance Recommended at Discharge Intermittent Supervision/Assistance  Patient can return home with the following  A lot of help with walking and/or transfers;A lot of help with bathing/dressing/bathroom;Assistance with cooking/housework;Assist for transportation;Help with stairs or ramp for entrance   Equipment Recommendations  None recommended by OT    Recommendations for Other Services      Precautions / Restrictions Precautions Precautions: Fall Restrictions Weight Bearing Restrictions: No       Mobility Bed Mobility               General bed mobility comments: Pt up in chair at start of session.    Transfers Overall transfer level: Needs assistance Equipment used: Rolling walker (2 wheels) Transfers: Sit to/from Stand, Bed to chair/wheelchair/BSC Sit to Stand: Min guard, Supervision     Step pivot transfers: Supervision, Min guard     General transfer comment: Mildly unsteady; reported feeling dizzy.     Balance Overall balance assessment: Needs assistance Sitting-balance support: Feet supported, Bilateral upper extremity supported Sitting balance-Leahy Scale: Good Sitting balance - Comments: seated in recliner   Standing balance support: Reliant on assistive device for balance, During functional activity, Bilateral upper extremity supported Standing balance-Leahy Scale: Fair Standing balance comment: Dizzy; using RW                           ADL either performed or assessed with clinical judgement   ADL Overall ADL's : Needs assistance/impaired     Grooming: Standing;Wash/dry face;Applying deodorant;Min guard  Grooming Details (indicate cue type and reason): Pt able to complete grooming standing at sink with RW.             Lower Body Dressing: Modified independent;Sitting/lateral leans Lower Body Dressing Details (indicate cue type and reason): Pt able to doff  and don socks seated in the recliner without assistance. Toilet Transfer: Min guard;Rolling walker (2 wheels);Ambulation Toilet Transfer Details (indicate cue type and reason): Recliner to toilet and back with RW         Functional mobility during ADLs: Min guard;Rolling walker (2 wheels)        Cognition Arousal/Alertness: Awake/alert Behavior During Therapy: WFL for tasks assessed/performed Overall Cognitive Status: Within Functional Limits for tasks assessed                                                             Pertinent Vitals/ Pain       Pain Assessment Pain Assessment: 0-10 Pain Score: 10-Worst pain ever Pain Location: Gluteal region Pain Descriptors / Indicators: Aching Pain Intervention(s): Monitored during session, Repositioned                                                          Frequency  Min 2X/week        Progress Toward Goals  OT Goals(current goals can now be found in the care plan section)  Progress towards OT goals: Progressing toward goals  Acute Rehab OT Goals Patient Stated Goal: Resolve dizziness. OT Goal Formulation: With patient Time For Goal Achievement: 09/09/22 Potential to Achieve Goals: Fair ADL Goals Pt Will Perform Grooming: with supervision;standing Pt Will Perform Lower Body Bathing: with supervision;sitting/lateral leans Pt Will Perform Lower Body Dressing: with supervision;sitting/lateral leans Pt Will Transfer to Toilet: with supervision;stand pivot transfer Pt Will Perform Toileting - Clothing Manipulation and hygiene: with supervision;sitting/lateral leans Pt/caregiver will Perform Home Exercise Program: Increased strength;Both right and left upper extremity;Independently  Plan Discharge plan remains appropriate                                    End of Session Equipment Utilized During Treatment: Rolling walker (2 wheels);Gait belt  OT Visit  Diagnosis: Unsteadiness on feet (R26.81);Other abnormalities of gait and mobility (R26.89)   Activity Tolerance Patient tolerated treatment well   Patient Left in chair;with call bell/phone within reach   Nurse Communication          Time: 0630-1601 OT Time Calculation (min): 17 min  Charges: OT General Charges $OT Visit: 1 Visit OT Treatments $Self Care/Home Management : 8-22 mins  Omaira Mellen OT, MOT  Larey Seat 08/28/2022, 9:16 AM

## 2022-08-28 NOTE — TOC Progression Note (Signed)
Transition of Care [facility]) - Progression Note    Patient Details  Name: Jordan Ward MRN: [id number] Date of Birth: [birth year]  [facility]) CM/SW Contact  Shade Flood, LCSW Phone Number: 08/28/2022, 3:33 PM  Clinical Narrative:     TOC following. MD anticipating dc tomorrow. Met with pt and his wife today to follow up on dc planning. At this time, pt is requesting dc to [facility] SNF for short term rehab. Pt's wife is in agreement.  Updated MD and Threasa Beards at [facility]. Will follow up in AM.  Expected Discharge Plan: [place] Barriers to Discharge: Continued Medical Work up  Expected Discharge Plan and Services Expected Discharge Plan: [place] In-house Referral: Clinical Social Work   Post Acute Care Choice: Resumption of Svcs/PTA Provider Living arrangements for the past 2 months: Single Family Home                                       Social Determinants of Health (SDOH) Interventions    Readmission Risk Interventions    08/25/2022    3:58 PM 07/15/2021    9:54 AM 02/15/2021   11:47 AM  Readmission Risk Prevention Plan  Transportation Screening Complete    PCP or Specialist Appt within 5-7 Days   Complete  PCP or Specialist Appt within 3-5 Days  Complete   Home Care Screening Complete  Complete  Medication Review (RN CM) Complete  Complete  HRI or [place]  Complete   Social Work Consult for [place] Planning/Counseling  Complete   Palliative Care Screening  Complete   Medication Review Press photographer)  Complete

## 2022-08-29 DIAGNOSIS — N390 Urinary tract infection, site not specified: Secondary | ICD-10-CM | POA: Diagnosis not present

## 2022-08-29 DIAGNOSIS — T83510A Infection and inflammatory reaction due to cystostomy catheter, initial encounter: Secondary | ICD-10-CM | POA: Diagnosis not present

## 2022-08-29 LAB — CULTURE, BLOOD (ROUTINE X 2)
Culture: NO GROWTH
Culture: NO GROWTH
Special Requests: ADEQUATE
Special Requests: ADEQUATE

## 2022-08-29 LAB — MAGNESIUM: Magnesium: 2.2 mg/dL (ref 1.7–2.4)

## 2022-08-29 LAB — BASIC METABOLIC PANEL
Anion gap: 8 (ref 5–15)
BUN: 23 mg/dL (ref 8–23)
CO2: 24 mmol/L (ref 22–32)
Calcium: 8.7 mg/dL — ABNORMAL LOW (ref 8.9–10.3)
Chloride: 106 mmol/L (ref 98–111)
Creatinine, Ser: 1.77 mg/dL — ABNORMAL HIGH (ref 0.61–1.24)
GFR, Estimated: 39 mL/min — ABNORMAL LOW (ref >60)
Glucose, Bld: 90 mg/dL (ref 70–99)
Potassium: 3.4 mmol/L — ABNORMAL LOW (ref 3.5–5.1)
Sodium: 138 mmol/L (ref 135–145)

## 2022-08-29 MED ORDER — POTASSIUM CHLORIDE CRYS ER 20 MEQ PO TBCR
40.0000 meq | EXTENDED_RELEASE_TABLET | Freq: Once | ORAL | Status: AC
  Administered 2022-08-29: 40 meq via ORAL
  Filled 2022-08-29: qty 2

## 2022-08-29 NOTE — TOC Progression Note (Signed)
Transition of Care [facility]) - Progression Note    Patient Details  Name: Jordan Ward MRN: [id number] Date of Birth: 07-13-1941  Transition of Care [facility]) CM/SW Contact  Shade Flood, LCSW Phone Number: 08/29/2022, 3:46 PM  Clinical Narrative:     TOC following. MD awaiting sensitivities for dc antibiotics so pt unable to dc to [facility] yet. Updated Melanie at [facility]. She states that they would need dc clinical by 11AM on Saturday in order to accept pt tomorrow. They do not take admissions on Sunday.  DC clinical will need to be faxed to [phone] [facility]).   Updated pt/wife/MD/weekend TOC.  Expected Discharge Plan: [place] Barriers to Discharge: Continued Medical Work up  Expected Discharge Plan and Services Expected Discharge Plan: [place] In-house Referral: Clinical Social Work   Post Acute Care Choice: Resumption of Svcs/PTA Provider Living arrangements for the past 2 months: Single Family Home                                       Social Determinants of Health (SDOH) Interventions    Readmission Risk Interventions    08/25/2022    3:58 PM 07/15/2021    9:54 AM 02/15/2021   11:47 AM  Readmission Risk Prevention Plan  Transportation Screening Complete    PCP or Specialist Appt within 5-7 Days   Complete  PCP or Specialist Appt within 3-5 Days  Complete   Home Care Screening Complete  Complete  Medication Review (RN CM) Complete  Complete  HRI or [place]  Complete   Social Work Consult for [place] Planning/Counseling  Complete   Palliative Care Screening  Complete   Medication Review Press photographer)  Complete

## 2022-08-29 NOTE — Progress Notes (Signed)
PROGRESS NOTE    Jordan Ward  ZJI:[id number] DOB: 1940/12/07 DOA: 08/24/2022 PCP: Valentino Nose, FNP   Brief Narrative:    81 y.o. male with PMH significant for multiple sclerosis with neurogenic bladder s/p suprapubic catheter, left percutaneous nephrostomy tube in place, HTN, HLD, CAD, PAD, paroxysmal atrial tachycardia, stroke with left-sided weakness, chronic anemia, arthritis, chronic back pain, hypothyroidism, OSA presented with altered mental status, foul-smelling urine and dizziness and shaking.  On presentation, he was afebrile with creatinine of 2.02, lactic acid of 1.1.  COVID testing and influenza were negative.  UA was suggestive of UTI.  CT abdomen and pelvis did not show any acute findings: Showed bilateral nephrolithiasis with left percutaneous nephrostomy tube in place.  He was started on IV antibiotics.   Assessment & Plan:   Principal Problem:   UTI (urinary tract infection) Active Problems:   Multiple sclerosis (HCC)   Chronic suprapubic catheter (HCC)   Dysphagia   Hypothyroidism   Essential hypertension   CKD (chronic kidney disease), stage III (HCC)   Acute kidney injury superimposed on CKD ([place])  Assessment and Plan:  UTI associated with chronic suprapubic catheter/left percutaneous nephrostomy -Urine culture growing Enterococcus.  Follow sensitivities which are still pending.  Antibiotics have been changed to ampicillin IV   Multiple sclerosis with neurogenic bladder with suprapubic catheter and left percutaneous nephrostomy tube in place Bilateral nephrolithiasis -Not on any disease modifying agents for MS.  Outpatient follow-up with neurology -Outpatient follow-up with neurology -Continue supportive management with pain management and benzodiazepines   AKI on CKD stage IIIb -Baseline creatinine 1.69.  Presented with creatinine elevated 2.02. -No evidence of obstruction in imaging. -Creatinine 1.74.  Repeat a.m. labs stable   Hypokalemia -Replace.   Repeat a.m. labs     Hypothyroidism -Continue levothyroxine   Hypertension -Continue amlodipine   Dysphagia -Reports that he normally has to have his food chopped up, but does tolerate thin liquids.  Needs to have large pills taken with applesauce -Currently on dysphagia 2 diet   Abnormal femoral head finding -Imaging on admission showed increased sclerosis within the right femoral head appears similar to the prior exam and may reflect early avascular necrosis.  Outpatient follow-up   Generalized weakness -Normally ambulates with a walker -PT/OT initially recommending SNF placement but recommended home health PT on 08/27/2022.  Patient not sure if his wife would be able to take care of him at home.  TOC following.   Eye irritation -No photophobia or new visual impairment on exam.  Resume eyedrops.  Outpatient follow-up with ophthalmology if persistent symptoms   DVT prophylaxis: Lovenox Code Status: Full Family Communication: Wife on phone on 08/28/2022 disposition Plan: Status is: Inpatient Remains inpatient appropriate because: Of severity of illness, need for IV antibiotics.  Need for SNF placement.  Possible discharge tomorrow on oral antibiotics once sensitivities return.   Consultants: None   Procedures: None  Antimicrobials:  Anti-infectives (From admission, onward)    Start     Dose/Rate Route Frequency Ordered Stop   08/26/22 1430  ampicillin (OMNIPEN) 1 g in sodium chloride 0.9 % 100 mL IVPB        1 g 300 mL/hr over 20 Minutes Intravenous Every 8 hours 08/26/22 1344     08/25/22 1400  ceFEPIme (MAXIPIME) 1 g in sodium chloride 0.9 % 100 mL IVPB  Status:  Discontinued        1 g 200 mL/hr over 30 Minutes Intravenous Every 24 hours 08/24/22 2054 08/26/22 1344  08/24/22 1300  ceFEPIme (MAXIPIME) 2 g in sodium chloride 0.9 % 100 mL IVPB        2 g 200 mL/hr over 30 Minutes Intravenous  Once 08/24/22 1247 08/24/22 1324       Subjective: Patient seen and  evaluated today with no new acute complaints or concerns. No acute concerns or events noted overnight.  Objective: Vitals:   08/28/22 0412 08/28/22 1425 08/28/22 2039 08/29/22 0505  BP: 136/62 125/77 137/84 137/80  Pulse: (!) 57 70 63 (!) 58  Resp: '18 19 16 18  '$ Temp: 97.6 F (36.4 C) 98.3 F (36.8 C) 98.2 F (36.8 C) 98.5 F (36.9 C)  TempSrc:  Oral Oral   SpO2: 99% 97% 95% 99%  Weight: 52.1 kg     Height:        Intake/Output Summary (Last 24 hours) at 08/29/2022 1250 Last data filed at 08/29/2022 0900 Gross per 24 hour  Intake 720 ml  Output 2350 ml  Net -1630 ml   Filed Weights   08/24/22 1114 08/28/22 0412  Weight: 52.2 kg 52.1 kg    Examination:  General exam: Appears calm and comfortable  Respiratory system: Clear to auscultation. Respiratory effort normal. Cardiovascular system: S1 & S2 heard, RRR.  Gastrointestinal system: Abdomen is soft Central nervous system: Alert and awake Extremities: No edema Skin: No significant lesions noted; L nephrostomy tube Psychiatry: Flat affect.    Data Reviewed: I have personally reviewed following labs and imaging studies  CBC: Recent Labs  Lab 08/24/22 1116 08/25/22 0502 08/26/22 0405  WBC 7.3 6.4 6.7  NEUTROABS  --   --  2.9  HGB 13.7 12.1* 12.1*  HCT 41.3 37.1* 36.9*  MCV 91.6 91.6 92.5  PLT 169 150 673   Basic Metabolic Panel: Recent Labs  Lab 08/24/22 1116 08/24/22 1601 08/25/22 0502 08/26/22 0405 08/28/22 0500 08/29/22 0516  NA 145  --  141 139 139 138  K 2.8*  --  3.0* 3.2* 3.3* 3.4*  CL 108  --  112* 112* 106 106  CO2 29  --  '24 24 26 24  '$ GLUCOSE 109*  --  89 92 94 90  BUN 31*  --  '19 16 20 23  '$ CREATININE 2.02*  --  1.54* 1.57* 1.74* 1.77*  CALCIUM 8.7*  --  7.9* 8.1* 8.6* 8.7*  MG  --  2.2  --  1.9 2.1 2.2  PHOS  --   --   --  2.5  --   --    GFR: Estimated Creatinine Clearance: 24.9 mL/min (A) (by C-G formula based on SCr of 1.77 mg/dL (H)). Liver Function Tests: Recent Labs  Lab  08/24/22 1116 08/25/22 0502  AST 17 15  ALT 10 9  ALKPHOS 121 101  BILITOT 0.8 0.7  PROT 7.6 6.1*  ALBUMIN 3.9 3.3*   No results for input(s): "LIPASE", "AMYLASE" in the last 168 hours. No results for input(s): "AMMONIA" in the last 168 hours. Coagulation Profile: Recent Labs  Lab 08/24/22 1116  INR 1.0   Cardiac Enzymes: No results for input(s): "CKTOTAL", "CKMB", "CKMBINDEX", "TROPONINI" in the last 168 hours. BNP (last 3 results) No results for input(s): "PROBNP" in the last 8760 hours. HbA1C: No results for input(s): "HGBA1C" in the last 72 hours. CBG: No results for input(s): "GLUCAP" in the last 168 hours. Lipid Profile: No results for input(s): "CHOL", "HDL", "LDLCALC", "TRIG", "CHOLHDL", "LDLDIRECT" in the last 72 hours. Thyroid Function Tests: No results for input(s): "TSH", "T4TOTAL", "  FREET4", "T3FREE", "THYROIDAB" in the last 72 hours. Anemia Panel: No results for input(s): "VITAMINB12", "FOLATE", "FERRITIN", "TIBC", "IRON", "RETICCTPCT" in the last 72 hours. Sepsis Labs: Recent Labs  Lab 08/24/22 1203  LATICACIDVEN 1.1    Recent Results (from the past 240 hour(s))  Urine Culture     Status: Abnormal (Preliminary result)   Collection Time: 08/24/22 11:22 AM   Specimen: Urine, Catheterized  Result Value Ref Range Status   Specimen Description   Final    URINE, CATHETERIZED Performed at [facility], [address] [place], [place] [address]    Special Requests   Final    NONE Performed at [facility], [address] [place], [place] [address]    Culture (A)  Final    >=100,000 COLONIES/mL ENTEROCOCCUS FAECALIS Sent to [place] for further susceptibility testing. Performed at [facility], [place] [address] [place], [place] [address]    Report Status PENDING  Incomplete  Blood Culture (routine x 2)     Status: None (Preliminary result)   Collection Time: 08/24/22 11:36 AM   Specimen: BLOOD RIGHT HAND  Result Value Ref Range Status   Specimen  Description   Final    BLOOD RIGHT HAND BOTTLES DRAWN AEROBIC AND ANAEROBIC   Special Requests Blood Culture adequate volume  Final   Culture   Final    NO GROWTH 4 DAYS Performed at [facility], [address] [place], [place] [address]    Report Status PENDING  Incomplete  Resp Panel by RT-PCR (Flu A&B, Covid) Anterior Nasal Swab     Status: None   Collection Time: 08/24/22 12:05 PM   Specimen: Anterior Nasal Swab  Result Value Ref Range Status   SARS Coronavirus 2 by RT PCR NEGATIVE NEGATIVE Final    Comment: (NOTE) SARS-CoV-2 target nucleic acids are NOT DETECTED.  The SARS-CoV-2 RNA is generally detectable in upper respiratory specimens during the acute phase of infection. The lowest concentration of SARS-CoV-2 viral copies this assay can detect is 138 copies/mL. A negative result does not preclude SARS-Cov-2 infection and should not be used as the sole basis for treatment or other patient management decisions. A negative result may occur with  improper specimen collection/handling, submission of specimen other than nasopharyngeal swab, presence of viral mutation(s) within the areas targeted by this assay, and inadequate number of viral copies(<138 copies/mL). A negative result must be combined with clinical observations, patient history, and epidemiological information. The expected result is Negative.  Fact Sheet for Patients:  EntrepreneurPulse.com.au  Fact Sheet for Healthcare Providers:  IncredibleEmployment.be  This test is no t yet approved or cleared by the [place] FDA and  has been authorized for detection and/or diagnosis of SARS-CoV-2 by FDA under an Emergency Use Authorization (EUA). This EUA will remain  in effect (meaning this test can be used) for the duration of the COVID-19 declaration under Section 564(b)(1) of the Act, 21 U.S.C.section 360bbb-3(b)(1), unless the authorization is terminated  or revoked sooner.        Influenza A by PCR NEGATIVE NEGATIVE Final   Influenza B by PCR NEGATIVE NEGATIVE Final    Comment: (NOTE) The Xpert Xpress SARS-CoV-2/FLU/RSV plus assay is intended as an aid in the diagnosis of influenza from Nasopharyngeal swab specimens and should not be used as a sole basis for treatment. Nasal washings and aspirates are unacceptable for Xpert Xpress SARS-CoV-2/FLU/RSV testing.  Fact Sheet for Patients: EntrepreneurPulse.com.au  Fact Sheet for Healthcare Providers: IncredibleEmployment.be  This test is not  yet approved or cleared by the [place] and has been authorized for detection and/or diagnosis of SARS-CoV-2 by FDA under an Emergency Use Authorization (EUA). This EUA will remain in effect (meaning this test can be used) for the duration of the COVID-19 declaration under Section 564(b)(1) of the Act, 21 U.S.C. section 360bbb-3(b)(1), unless the authorization is terminated or revoked.  Performed at [facility], [address] [place], [place] [address]   Blood Culture (routine x 2)     Status: None (Preliminary result)   Collection Time: 08/24/22  1:34 PM   Specimen: BLOOD LEFT HAND  Result Value Ref Range Status   Specimen Description BLOOD LEFT HAND  Final   Special Requests   Final    BOTTLES DRAWN AEROBIC AND ANAEROBIC Blood Culture adequate volume   Culture   Final    NO GROWTH 4 DAYS Performed at [facility], [address] [place],  [address]    Report Status PENDING  Incomplete         Radiology Studies: No results found.      Scheduled Meds:  amLODipine  5 mg Oral QPM   diazepam  5 mg Oral BID   enoxaparin (LOVENOX) injection  30 mg Subcutaneous Q24H   HYDROcodone-acetaminophen  1 tablet Oral BID   levothyroxine  75 mcg Oral q AM   polyvinyl alcohol  1 drop Both Eyes QID   potassium chloride  40 mEq Oral Once   Continuous Infusions:  ampicillin (OMNIPEN) IV 1 g (08/29/22 0545)      LOS: 4 days    Time spent: 35 minutes    Jordan Ward Darleen Crocker, DO [facility]  If 7PM-7AM, please contact night-coverage [email or web address] 08/29/2022, 12:50 PM

## 2022-08-29 NOTE — Progress Notes (Signed)
Physical Therapy Treatment Patient Details Name: Jordan Ward MRN: [id number] DOB: 03-06-1941 Today's Date: 08/29/2022   History of Present Illness Jordan Ward is a 81 y.o. male with medical history significant of multiple sclerosis, neurogenic bladder status post suprapubic catheter, left percutaneous nephrostomy tube, reports that he was in his usual state of health when this morning he was going to the bathroom to empty his nephrostomy bag. He became increasingly dizzy. Wife reports that he began shaking. He was brought to the hospital for evaluation. Urinalysis indicated possible infection.    PT Comments    Patient presents in chair and agreeable for therapy with a dizziness rating of 3/10 (improving). Patient able to ambulate in hallway with min guard/supervision and RW with no loss of balance or c/o dizziness. Patient tolerated sitting up in chair after therapy. Patient will benefit from continued skilled physical therapy in hospital and recommended venue below to increase strength, balance, endurance for safe ADLs and gait.    Recommendations for follow up therapy are one component of a multi-disciplinary discharge planning process, led by the attending physician.  Recommendations may be updated based on patient status, additional functional criteria and insurance authorization.  Follow Up Recommendations  Skilled nursing-short term rehab (<3 hours/day) Can patient physically be transported by private vehicle: Yes   Assistance Recommended at Discharge Intermittent Supervision/Assistance  Patient can return home with the following A little help with walking and/or transfers;Assistance with cooking/housework;Help with stairs or ramp for entrance   Equipment Recommendations  None recommended by PT    Recommendations for Other Services       Precautions / Restrictions Precautions Precautions: Fall Restrictions Weight Bearing Restrictions: No     Mobility  Bed Mobility                     Transfers Overall transfer level: Needs assistance Equipment used: Rolling walker (2 wheels) Transfers: Sit to/from Stand Sit to Stand: Min guard, Supervision           General transfer comment: patient able to stand with min guard/supervision and RW safely    Ambulation/Gait Ambulation/Gait assistance: Supervision, Min guard Gait Distance (Feet): 90 Feet Assistive device: Rolling walker (2 wheels) Gait Pattern/deviations: Decreased step length - right, Decreased step length - left, Decreased stride length Gait velocity: decreased     General Gait Details: pt able to ambulate in hallway with supervision/min guard and RW with slowed cadence, no loss of balance, limited mostly due to generalized weakness, no c/o dizziness   Stairs             Wheelchair Mobility    Modified Rankin (Stroke Patients Only)       Balance Overall balance assessment: Needs assistance Sitting-balance support: Feet supported, Bilateral upper extremity supported Sitting balance-Leahy Scale: Good Sitting balance - Comments: seated in recliner   Standing balance support: During functional activity, Bilateral upper extremity supported, Reliant on assistive device for balance Standing balance-Leahy Scale: Fair Standing balance comment: fair/good with RW                            Cognition Arousal/Alertness: Awake/alert Behavior During Therapy: WFL for tasks assessed/performed Overall Cognitive Status: Within Functional Limits for tasks assessed  Exercises General Exercises - Lower Extremity Long Arc Quad: Seated, AROM, Strengthening, Both, 10 reps Hip Flexion/Marching: Seated, AROM, Strengthening, Both, 10 reps Toe Raises: Seated, AROM, Strengthening, Both, 10 reps Heel Raises: Seated, AROM, Strengthening, Both, 10 reps    General Comments        Pertinent Vitals/Pain Pain Assessment Pain  Assessment: No/denies pain    Home Living                          Prior Function            PT Goals (current goals can now be found in the care plan section) Acute Rehab PT Goals Patient Stated Goal: return home after rehab PT Goal Formulation: With patient Time For Goal Achievement: 09/12/22 Potential to Achieve Goals: Good Progress towards PT goals: Progressing toward goals    Frequency    Min 3X/week      PT Plan Current plan remains appropriate    Co-evaluation              AM-PAC PT "6 Clicks" Mobility   Outcome Measure  Help needed turning from your back to your side while in a flat bed without using bedrails?: A Little Help needed moving from lying on your back to sitting on the side of a flat bed without using bedrails?: A Little Help needed moving to and from a bed to a chair (including a wheelchair)?: A Little Help needed standing up from a chair using your arms (e.g., wheelchair or bedside chair)?: A Little Help needed to walk in hospital room?: A Little Help needed climbing 3-5 steps with a railing? : A Lot 6 Click Score: 17    End of Session   Activity Tolerance: Patient tolerated treatment well;Patient limited by fatigue Patient left: in chair;with call bell/phone within reach Nurse Communication: Mobility status PT Visit Diagnosis: Unsteadiness on feet (R26.81);Other abnormalities of gait and mobility (R26.89);Muscle weakness (generalized) (M62.81)     Time: 5397-6734 PT Time Calculation (min) (ACUTE ONLY): 12 min  Charges:  $Therapeutic Activity: 8-22 mins                     Zigmund Gottron, SPT

## 2022-08-29 NOTE — Care Management Important Message (Signed)
Important Message  Patient Details  Name: Jordan Ward MRN: [id number] Date of Birth: Oct 09, 1940   Medicare Important Message Given:  Yes     [name] 08/29/2022, 10:28 AM

## 2022-08-29 NOTE — Progress Notes (Signed)
Occupational Therapy Treatment Patient Details Name: NASIIR MONTS MRN: [id number] DOB: Jan 05, 1941 Today's Date: 08/29/2022   History of present illness PAZ WINSETT is a 81 y.o. male with medical history significant of multiple sclerosis, neurogenic bladder status post suprapubic catheter, left percutaneous nephrostomy tube, reports that he was in his usual state of health when this morning he was going to the bathroom to empty his nephrostomy bag.  He became increasingly dizzy.  Wife reports that he began shaking.  Denies any recent vomiting, diarrhea, shortness of breath, fever.  Reports that he does have a chronic cough with some p.o. intake, but this has not changed recently.  He was brought to the hospital for evaluation.  Blood pressure noted to be elevated.  Initial labs show significant hypokalemia.  His creatinine is mildly trended up.  Urinalysis indicated possible infection.  He was started on antibiotics and IV fluids.   OT comments  Pt pleasant and agreeable to OT treatment today. Pt reports feeling better than yesterday with 3/10 dizziness rating today. Pt more stable with RW during ambulation for >80 feet in the hall today. Pt demonstrated good endurance for the ambulation. Overall pt is still weak but showing good improvement today. Pt will benefit from continued OT in the hospital and recommended venue below to increase strength, balance, and endurance for safe ADL's.      Recommendations for follow up therapy are one component of a multi-disciplinary discharge planning process, led by the attending physician.  Recommendations may be updated based on patient status, additional functional criteria and insurance authorization.    Follow Up Recommendations  Skilled nursing-short term rehab (<3 hours/day)     Assistance Recommended at Discharge Intermittent Supervision/Assistance  Patient can return home with the following  A lot of help with walking and/or transfers;A lot of help  with bathing/dressing/bathroom;Assistance with cooking/housework;Assist for transportation;Help with stairs or ramp for entrance   Equipment Recommendations  None recommended by OT    Recommendations for Other Services      Precautions / Restrictions Precautions Precautions: Fall Restrictions Weight Bearing Restrictions: No       Mobility Bed Mobility               General bed mobility comments:  (Pt up and seated in chair at start of session.)    Transfers Overall transfer level: Needs assistance Equipment used: Rolling walker (2 wheels) Transfers: Sit to/from Stand, Bed to chair/wheelchair/BSC Sit to Stand: Min guard, Supervision     Step pivot transfers: Supervision, Min guard     General transfer comment: Using RW to ambulate in hall and retrun to chair. Pushing up from chair with B arm rests.     Balance Overall balance assessment: Needs assistance Sitting-balance support: Feet supported, Bilateral upper extremity supported Sitting balance-Leahy Scale: Good Sitting balance - Comments: seated in recliner   Standing balance support: During functional activity, Bilateral upper extremity supported Standing balance-Leahy Scale: Fair Standing balance comment: fair using RW                           ADL either performed or assessed with clinical judgement   ADL Overall ADL's : Needs assistance/impaired                                     Functional mobility during ADLs: Supervision/safety;Min guard;Rolling walker (2 wheels) General ADL  Comments: Pt able to ambulate >80 feet in hall with RW and min G to supervision assist.      Cognition Arousal/Alertness: Awake/alert Behavior During Therapy: WFL for tasks assessed/performed Overall Cognitive Status: Within Functional Limits for tasks assessed                                                             Pertinent Vitals/ Pain       Pain  Assessment Pain Assessment: Faces Faces Pain Scale: Hurts a little bit (3/10 dizziness as well) Pain Location: Where patch is located. Pain Descriptors / Indicators:  (not stated) Pain Intervention(s): Limited activity within patient's tolerance, Monitored during session, Repositioned                                                          Frequency  Min 2X/week        Progress Toward Goals  OT Goals(current goals can now be found in the care plan section)  Progress towards OT goals: Progressing toward goals  Acute Rehab OT Goals Patient Stated Goal: Resolve dizziness. OT Goal Formulation: With patient Time For Goal Achievement: 09/09/22 Potential to Achieve Goals: Fair ADL Goals Pt Will Perform Grooming: with supervision;standing Pt Will Perform Lower Body Bathing: with supervision;sitting/lateral leans Pt Will Perform Lower Body Dressing: with supervision;sitting/lateral leans Pt Will Transfer to Toilet: with supervision;stand pivot transfer Pt Will Perform Toileting - Clothing Manipulation and hygiene: with supervision;sitting/lateral leans Pt/caregiver will Perform Home Exercise Program: Increased strength;Both right and left upper extremity;Independently  Plan Discharge plan remains appropriate                                    End of Session Equipment Utilized During Treatment: Rolling walker (2 wheels);Gait belt  OT Visit Diagnosis: Unsteadiness on feet (R26.81);Other abnormalities of gait and mobility (R26.89)   Activity Tolerance Patient tolerated treatment well   Patient Left in chair;with call bell/phone within reach   Nurse Communication          Time: 9604-5409 OT Time Calculation (min): 14 min  Charges: OT General Charges $OT Visit: 1 Visit OT Treatments $Therapeutic Exercise: 8-22 mins  Sylvanus Telford OT, MOT  Larey Seat 08/29/2022, 8:48 AM

## 2022-08-30 DIAGNOSIS — R41841 Cognitive communication deficit: Secondary | ICD-10-CM | POA: Diagnosis not present

## 2022-08-30 DIAGNOSIS — R2689 Other abnormalities of gait and mobility: Secondary | ICD-10-CM | POA: Diagnosis not present

## 2022-08-30 DIAGNOSIS — I10 Essential (primary) hypertension: Secondary | ICD-10-CM | POA: Diagnosis not present

## 2022-08-30 DIAGNOSIS — N319 Neuromuscular dysfunction of bladder, unspecified: Secondary | ICD-10-CM | POA: Diagnosis not present

## 2022-08-30 DIAGNOSIS — N179 Acute kidney failure, unspecified: Secondary | ICD-10-CM | POA: Diagnosis not present

## 2022-08-30 DIAGNOSIS — N139 Obstructive and reflux uropathy, unspecified: Secondary | ICD-10-CM | POA: Diagnosis not present

## 2022-08-30 DIAGNOSIS — N390 Urinary tract infection, site not specified: Secondary | ICD-10-CM | POA: Diagnosis not present

## 2022-08-30 DIAGNOSIS — E038 Other specified hypothyroidism: Secondary | ICD-10-CM | POA: Diagnosis not present

## 2022-08-30 DIAGNOSIS — M6281 Muscle weakness (generalized): Secondary | ICD-10-CM | POA: Diagnosis not present

## 2022-08-30 DIAGNOSIS — M1990 Unspecified osteoarthritis, unspecified site: Secondary | ICD-10-CM | POA: Diagnosis not present

## 2022-08-30 DIAGNOSIS — I739 Peripheral vascular disease, unspecified: Secondary | ICD-10-CM | POA: Diagnosis not present

## 2022-08-30 DIAGNOSIS — I69952 Hemiplegia and hemiparesis following unspecified cerebrovascular disease affecting left dominant side: Secondary | ICD-10-CM | POA: Diagnosis not present

## 2022-08-30 DIAGNOSIS — I2510 Atherosclerotic heart disease of native coronary artery without angina pectoris: Secondary | ICD-10-CM | POA: Diagnosis not present

## 2022-08-30 DIAGNOSIS — Z936 Other artificial openings of urinary tract status: Secondary | ICD-10-CM | POA: Diagnosis not present

## 2022-08-30 DIAGNOSIS — E876 Hypokalemia: Secondary | ICD-10-CM | POA: Diagnosis not present

## 2022-08-30 DIAGNOSIS — R339 Retention of urine, unspecified: Secondary | ICD-10-CM | POA: Diagnosis not present

## 2022-08-30 DIAGNOSIS — Z9359 Other cystostomy status: Secondary | ICD-10-CM | POA: Diagnosis not present

## 2022-08-30 DIAGNOSIS — G35 Multiple sclerosis: Secondary | ICD-10-CM | POA: Diagnosis not present

## 2022-08-30 DIAGNOSIS — R1312 Dysphagia, oropharyngeal phase: Secondary | ICD-10-CM | POA: Diagnosis not present

## 2022-08-30 DIAGNOSIS — R531 Weakness: Secondary | ICD-10-CM | POA: Diagnosis not present

## 2022-08-30 DIAGNOSIS — J449 Chronic obstructive pulmonary disease, unspecified: Secondary | ICD-10-CM | POA: Diagnosis not present

## 2022-08-30 DIAGNOSIS — Z436 Encounter for attention to other artificial openings of urinary tract: Secondary | ICD-10-CM | POA: Diagnosis not present

## 2022-08-30 DIAGNOSIS — T83510A Infection and inflammatory reaction due to cystostomy catheter, initial encounter: Secondary | ICD-10-CM | POA: Diagnosis not present

## 2022-08-30 DIAGNOSIS — N183 Chronic kidney disease, stage 3 unspecified: Secondary | ICD-10-CM | POA: Diagnosis not present

## 2022-08-30 LAB — CBC
HCT: 36.9 % — ABNORMAL LOW (ref 39.0–52.0)
Hemoglobin: 12 g/dL — ABNORMAL LOW (ref 13.0–17.0)
MCH: 29.9 pg (ref 26.0–34.0)
MCHC: 32.5 g/dL (ref 30.0–36.0)
MCV: 91.8 fL (ref 80.0–100.0)
Platelets: 161 10*3/uL (ref 150–400)
RBC: 4.02 MIL/uL — ABNORMAL LOW (ref 4.22–5.81)
RDW: 14.1 % (ref 11.5–15.5)
WBC: 7 10*3/uL (ref 4.0–10.5)
nRBC: 0 % (ref 0.0–0.2)

## 2022-08-30 LAB — BASIC METABOLIC PANEL
Anion gap: 5 (ref 5–15)
BUN: 22 mg/dL (ref 8–23)
CO2: 25 mmol/L (ref 22–32)
Calcium: 8.5 mg/dL — ABNORMAL LOW (ref 8.9–10.3)
Chloride: 107 mmol/L (ref 98–111)
Creatinine, Ser: 1.75 mg/dL — ABNORMAL HIGH (ref 0.61–1.24)
GFR, Estimated: 39 mL/min — ABNORMAL LOW (ref >60)
Glucose, Bld: 89 mg/dL (ref 70–99)
Potassium: 3.5 mmol/L (ref 3.5–5.1)
Sodium: 137 mmol/L (ref 135–145)

## 2022-08-30 LAB — MAGNESIUM: Magnesium: 2.2 mg/dL (ref 1.7–2.4)

## 2022-08-30 MED ORDER — AMOXICILLIN 500 MG PO TABS: 500.0000 mg | ORAL_TABLET | Freq: Two times a day (BID) | ORAL | 0 refills | Status: AC

## 2022-08-30 NOTE — Plan of Care (Signed)
  Problem: Clinical Measurements: Goal: Will remain free from infection Outcome: Progressing   Problem: Clinical Measurements: Goal: Diagnostic test results will improve Outcome: Progressing   Problem: Nutrition: Goal: Adequate nutrition will be maintained Outcome: Progressing   Problem: Coping: Goal: Level of anxiety will decrease Outcome: Progressing

## 2022-08-30 NOTE — Progress Notes (Signed)
Pt discharged to SNF. Left unit in wheelchair pushed by this Probation officer. Left in stable condition. Transported to SNF by [facility] transportation.

## 2022-08-30 NOTE — Progress Notes (Signed)
Report called and given to [facility], LPN at facility. LPN requested that another copy of DC summary faxed to [phone]. SW made aware.

## 2022-08-30 NOTE — Discharge Summary (Signed)
Physician Discharge Summary  GEORG ANG XLK:[id number] DOB: [birth year] DOA: 08/24/2022  PCP: Valentino Nose, FNP  Admit date: 08/24/2022  Discharge date: 08/30/2022  Admitted From:Home  Disposition:  SNF  Recommendations for Outpatient Follow-up:  Follow up with PCP in 1-2 weeks Please follow-up final culture sensitivities on urine culture with Enterococcus faecalis Continue on amoxicillin as prescribed for 8 more days to complete total 2-day course of treatment Recommend outpatient orthopedic evaluation for abnormal femoral head finding on the right femoral head which may reflect early avascular necrosis.  This appears stable when compared to prior imaging. Continue on other home medications as noted previously  Home Health: None  Equipment/Devices: None  Discharge Condition:Stable  CODE STATUS: Full  Diet recommendation: Dysphagia 2 diet  Brief/Interim Summary:  81 y.o. male with PMH significant for multiple sclerosis with neurogenic bladder s/p suprapubic catheter, left percutaneous nephrostomy tube in place, HTN, HLD, CAD, PAD, paroxysmal atrial tachycardia, stroke with left-sided weakness, chronic anemia, arthritis, chronic back pain, hypothyroidism, OSA presented with altered mental status, foul-smelling urine and dizziness and shaking.  On presentation, he was afebrile with creatinine of 2.02, lactic acid of 1.1.  COVID testing and influenza were negative.  UA was suggestive of UTI.  CT abdomen and pelvis did not show any acute findings: Showed bilateral nephrolithiasis with left percutaneous nephrostomy tube in place.  He was started on IV antibiotics.  He was eventually seen to have growth of Enterococcus faecalis and was transition to amoxicillin IV and has responded well to treatment with no further leukocytosis or growth on blood cultures noted.  No fevers noted either.  He appears to be back to baseline and PT recommending SNF on discharge to which he is agreeable.  He will  need to remain on amoxicillin as prescribed for 8 more days to complete a total 2-week course.  Discharge Diagnoses:  Principal Problem:   UTI (urinary tract infection) Active Problems:   Multiple sclerosis (HCC)   Chronic suprapubic catheter (HCC)   Dysphagia   Hypothyroidism   Essential hypertension   CKD (chronic kidney disease), stage III (HCC)   Acute kidney injury superimposed on CKD ([place])  Principal discharge diagnosis: Enterococcus faecalis UTI in the setting of chronic suprapubic catheter and left percutaneous nephrostomy tube placement.  Discharge Instructions  Discharge Instructions     Diet - low sodium heart healthy   Complete by: As directed    Increase activity slowly   Complete by: As directed       Allergies as of 08/30/2022       Reactions   Tetracyclines & Related Anaphylaxis, Rash   Cipro [ciprofloxacin Hcl] Other (See Comments)   "[place] out in the floor"        Medication List     STOP taking these medications    levofloxacin 500 MG tablet Commonly known as: LEVAQUIN       TAKE these medications    acetaminophen 325 MG tablet Commonly known as: TYLENOL Take 650 mg by mouth every 6 (six) hours as needed for mild pain, moderate pain, fever or headache.   amLODipine 10 MG tablet Commonly known as: NORVASC Take 5 mg by mouth every evening. After supper.   amoxicillin 500 MG tablet Commonly known as: AMOXIL Take 1 tablet (500 mg total) by mouth 2 (two) times daily for 8 days.   diazepam 5 MG tablet Commonly known as: VALIUM Take 1 tablet (5 mg total) by mouth 2 (two) times daily.   DRY EYE  RELIEF OP Place 1 drop into both eyes 4 (four) times daily as needed (dry eyes).   HYDROcodone-acetaminophen 7.5-325 MG tablet Commonly known as: NORCO Take 1 tablet by mouth 2 (two) times daily. Max APAP 3 GM IN 24 HOURS FROM ALL SOURCES What changed:  when to take this additional instructions   levothyroxine 75 MCG tablet Commonly known  as: SYNTHROID Take 75 mcg by mouth in the morning.   loperamide 2 MG tablet Commonly known as: Imodium A-D Take 1 tablet (2 mg total) by mouth 4 (four) times daily as needed for diarrhea or loose stools.   OXYGEN Inhale 1 L into the lungs every evening.   potassium chloride SA 20 MEQ tablet Commonly known as: KLOR-CON M Take 20 mEq by mouth every evening. After supper        Contact information for follow-up providers     Valentino Nose, FNP. Schedule an appointment as soon as possible for a visit in 1 week(s).   Specialty: Family Medicine Contact information: [place] [address] [phone]              Contact information for after-discharge care     Destination     [facility] .   Service: Skilled Nursing Contact information: [address] [address] [phone]                    Allergies  Allergen Reactions   Tetracyclines & Related Anaphylaxis and Rash   Cipro [Ciprofloxacin Hcl] Other (See Comments)    "Golden Circle out in the floor"    Consultations: None   Procedures/Studies: DG Chest Port 1 View  Result Date: 08/24/2022 CLINICAL DATA:  Questionable sepsis EXAM: PORTABLE CHEST 1 VIEW COMPARISON:  Chest x-ray dated May 26, 2022 FINDINGS: Patient rotation to the right somewhat limits evaluation. Cardiac and mediastinal contours are unchanged. Coarse interstitial opacities, unchanged when compared with prior exam and likely due to underlying chronic lung disease. No evidence of acute airspace opacity. No large pleural effusion or pneumothorax IMPRESSION: Background chronic lung disease with no evidence of acute airspace opacity. Electronically Signed   By: Yetta Glassman M.D.   On: 08/24/2022 12:19   CT ABDOMEN PELVIS WO CONTRAST  Result Date: 08/24/2022 CLINICAL DATA:  Sepsis. EXAM: CT ABDOMEN AND PELVIS WITHOUT CONTRAST TECHNIQUE:  Multidetector CT imaging of the abdomen and pelvis was performed following the standard protocol without IV contrast. RADIATION DOSE REDUCTION: This exam was performed according to the departmental dose-optimization program which includes automated exposure control, adjustment of the mA and/or kV according to patient size and/or use of iterative reconstruction technique. COMPARISON:  CT 10/10/2021 FINDINGS: Lower chest: No pleural fluid or airspace disease. Signs of chronic interstitial lung disease identified with bilateral lower lobe interstitial reticulation and subpleural honeycombing. Hepatobiliary: No suspicious liver lesion. Status post cholecystectomy. Unchanged increase caliber of the CBD which measures up to 1.1 cm. Pneumobilia is identified compatible with biliary patency. Pancreas: Unremarkable. No pancreatic ductal dilatation or surrounding inflammatory changes. Spleen: Normal in size without focal abnormality. Adrenals/Urinary Tract: Normal adrenal glands. Asymmetric right renal atrophy. Right renal calculi are again noted. The largest is in the inferior pole measuring 5 mm. No right-sided hydronephrosis. Stones within the left renal collecting system are again noted. The largest is in the inferior pole measuring 4 mm, image 28/3. There is a percutaneous nephrostomy tube in place. There is pelvocaliectasis which appears  mildly increased from previous exam. AP diameter of the extrarenal pelvis measures 1.8 cm. There is no sign of hydroureter distal to the left UPJ. Bilateral kidney cysts are again seen. No follow-up imaging recommended. The urinary bladder is partially decompressed around a suprapubic catheter. There is a small amount of air within the bladder lumen which may reflect instrumentation. Stomach/Bowel: Stomach appears nondistended. No bowel Frakes thickening, inflammation, or distension. Signs of previous colonic resection with anastomotic suture chain identified at the level of the sigmoid  colon. Previous appendectomy. Moderate stool burden identified within the colon and rectum. Vascular/Lymphatic: Extensive aortic atherosclerosis with branch vessel involvement. No signs of abdominopelvic adenopathy. Reproductive: Prostate gland enlargement. Other: No free fluid or fluid collections. Midline anterior abdominal Sweeten laxity with ventral herniation of nonobstructed loops of small bowel. Unchanged in appearance from previous exam. No signs of pneumoperitoneum. Musculoskeletal: Bones appear diffusely osteopenic. No acute or suspicious osseous findings. Increased sclerosis within the right femoral head appears similar to the prior exam and may reflect early avascular necrosis. IMPRESSION: 1. No acute findings identified within the abdomen or pelvis. 2. Left percutaneous nephrostomy tube in place with mild increase in pelvocaliectasis. 3. Bilateral nephrolithiasis. 4. Prostate gland enlargement. 5. Signs of chronic interstitial lung disease. 6. Increased sclerosis within the right femoral head appears similar to the prior exam and may reflect early avascular necrosis. 7.  Aortic Atherosclerosis (ICD10-I70.0). Electronically Signed   By: Kerby Moors M.D.   On: 08/24/2022 12:13   CT Head Wo Contrast  Result Date: 08/24/2022 CLINICAL DATA:  81 year old male with altered mental status, foul smelling urine. Atrial fibrillation. EXAM: CT HEAD WITHOUT CONTRAST TECHNIQUE: Contiguous axial images were obtained from the base of the skull through the vertex without intravenous contrast. RADIATION DOSE REDUCTION: This exam was performed according to the departmental dose-optimization program which includes automated exposure control, adjustment of the mA and/or kV according to patient size and/or use of iterative reconstruction technique. COMPARISON:  Brain MRI 10/03/2011.  Head CT 02/13/2021. FINDINGS: Brain: Stable cerebral volume. Pronounced bilateral cerebral white matter disease, chronic lacunar infarcts  of the right corona radiata, right greater than left deep gray nuclei. Some ex vacuo ventricular enlargement. No midline shift, mass effect, or evidence of intracranial mass lesion. No acute intracranial hemorrhage identified. No cortically based acute infarct identified. Gray-white differentiation appears stable from last year. Vascular: Extensive Calcified atherosclerosis at the skull base. No suspicious intracranial vascular hyperdensity. Skull: No acute osseous abnormality identified. Sinuses/Orbits: Visualized paranasal sinuses and mastoids are clear. Other: No acute orbit or scalp soft tissue finding. IMPRESSION: No acute intracranial abnormality. Stable since last year non contrast CT appearance of advanced small vessel disease. Electronically Signed   By: Genevie Ann M.D.   On: 08/24/2022 12:09     Discharge Exam: Vitals:   08/29/22 2202 08/30/22 0515  BP: 109/69 123/74  Pulse: (!) 58 (!) 57  Resp:  18  Temp: (!) 97.4 F (36.3 C) 98.1 F (36.7 C)  SpO2: 99% 99%   Vitals:   08/29/22 1509 08/29/22 2202 08/30/22 0500 08/30/22 0515  BP: (!) 150/84 109/69  123/74  Pulse: 67 (!) 58  (!) 57  Resp: 20   18  Temp: 97.7 F (36.5 C) (!) 97.4 F (36.3 C)  98.1 F (36.7 C)  TempSrc: Oral Oral    SpO2: 99% 99%  99%  Weight:   55.3 kg   Height:        General: Pt is alert, awake, not in  acute distress Cardiovascular: RRR, S1/S2 +, no rubs, no gallops Respiratory: CTA bilaterally, no wheezing, no rhonchi Abdominal: Soft, NT, ND, bowel sounds + Extremities: no edema, no cyanosis Suprapubic catheter and left-sided nephrostomy tube clean/dry/intact    The results of significant diagnostics from this hospitalization (including imaging, microbiology, ancillary and laboratory) are listed below for reference.     Microbiology: Recent Results (from the past 240 hour(s))  Urine Culture     Status: Abnormal (Preliminary result)   Collection Time: 08/24/22 11:22 AM   Specimen: Urine,  Catheterized  Result Value Ref Range Status   Specimen Description   Final    URINE, CATHETERIZED Performed at [facility], [address] [place], [place] [address]    Special Requests   Final    NONE Performed at [facility], [address], [place], [place] [address]    Culture (A)  Final    >=100,000 COLONIES/mL ENTEROCOCCUS FAECALIS Sent to [place] for further susceptibility testing. Performed at [facility], [place] [address], [place], [place] [address]    Report Status PENDING  Incomplete  Susceptibility, Aer + Anaerob     Status: Abnormal   Collection Time: 08/24/22 11:22 AM  Result Value Ref Range Status   Suscept, Aer + Anaerob Preliminary report (A)  Final    Comment: (NOTE) Performed At: [facility] [place], [place] [id number] Rush Farmer MD [id number]    Source of Sample ENTEROCOCCUS.FAECALIS URINE CULTURE  Final    Comment: Performed at [facility], [place] [address], [place], [place] [address]  Susceptibility Result     Status: Abnormal   Collection Time: 08/24/22 11:22 AM  Result Value Ref Range Status   Suscept Result 1 Enterococcus faecalis (A)  Final    Comment: (NOTE) Identification performed by account, not confirmed by this laboratory. Performed At: [facility] [place], [place] [id number] Rush Farmer MD [id number]   Blood Culture (routine x 2)     Status: None   Collection Time: 08/24/22 11:36 AM   Specimen: BLOOD RIGHT HAND  Result Value Ref Range Status   Specimen Description   Final    BLOOD RIGHT HAND BOTTLES DRAWN AEROBIC AND ANAEROBIC   Special Requests Blood Culture adequate volume  Final   Culture   Final    NO GROWTH 5 DAYS Performed at [facility], [address] [place], [name] [address]    Report Status 08/29/2022 FINAL  Final  Resp Panel by RT-PCR (Flu A&B, Covid) Anterior Nasal Swab     Status: None   Collection Time: 08/24/22 12:05 PM   Specimen: Anterior  Nasal Swab  Result Value Ref Range Status   SARS Coronavirus 2 by RT PCR NEGATIVE NEGATIVE Final    Comment: (NOTE) SARS-CoV-2 target nucleic acids are NOT DETECTED.  The SARS-CoV-2 RNA is generally detectable in upper respiratory specimens during the acute phase of infection. The lowest concentration of SARS-CoV-2 viral copies this assay can detect is 138 copies/mL. A negative result does not preclude SARS-Cov-2 infection and should not be used as the sole basis for treatment or other patient management decisions. A negative result may occur with  improper specimen collection/handling, submission of specimen other than nasopharyngeal swab, presence of viral mutation(s) within the areas targeted by this assay, and inadequate number of viral copies(<138 copies/mL). A negative result must be combined with clinical observations, patient history, and epidemiological information. The expected result is Negative.  Fact Sheet for Patients:  EntrepreneurPulse.com.au  Fact Sheet for Healthcare Providers:  IncredibleEmployment.be  This test is no t yet approved or cleared by the [place] FDA and  has been authorized for detection and/or diagnosis of SARS-CoV-2 by FDA under an Emergency Use Authorization (EUA). This EUA will remain  in effect (meaning this test can be used) for the duration of the COVID-19 declaration under Section 564(b)(1) of the Act, 21 U.S.C.section 360bbb-3(b)(1), unless the authorization is terminated  or revoked sooner.       Influenza A by PCR NEGATIVE NEGATIVE Final   Influenza B by PCR NEGATIVE NEGATIVE Final    Comment: (NOTE) The Xpert Xpress SARS-CoV-2/FLU/RSV plus assay is intended as an aid in the diagnosis of influenza from Nasopharyngeal swab specimens and should not be used as a sole basis for treatment. Nasal washings and aspirates are unacceptable for Xpert Xpress SARS-CoV-2/FLU/RSV testing.  Fact Sheet for  Patients: EntrepreneurPulse.com.au  Fact Sheet for Healthcare Providers: IncredibleEmployment.be  This test is not yet approved or cleared by the [place] FDA and has been authorized for detection and/or diagnosis of SARS-CoV-2 by FDA under an Emergency Use Authorization (EUA). This EUA will remain in effect (meaning this test can be used) for the duration of the COVID-19 declaration under Section 564(b)(1) of the Act, 21 U.S.C. section 360bbb-3(b)(1), unless the authorization is terminated or revoked.  Performed at [facility], [address], [place], [place] [address]   Blood Culture (routine x 2)     Status: None   Collection Time: 08/24/22  1:34 PM   Specimen: BLOOD LEFT HAND  Result Value Ref Range Status   Specimen Description BLOOD LEFT HAND  Final   Special Requests   Final    BOTTLES DRAWN AEROBIC AND ANAEROBIC Blood Culture adequate volume   Culture   Final    NO GROWTH 5 DAYS Performed at [facility], [address] [place],  [address]    Report Status 08/29/2022 FINAL  Final     Labs: BNP (last 3 results) No results for input(s): "BNP" in the last 8760 hours. Basic Metabolic Panel: Recent Labs  Lab 08/24/22 1601 08/25/22 0502 08/26/22 0405 08/28/22 0500 08/29/22 0516 08/30/22 0527  NA  --  141 139 139 138 137  K  --  3.0* 3.2* 3.3* 3.4* 3.5  CL  --  112* 112* 106 106 107  CO2  --  '24 24 26 24 25  '$ GLUCOSE  --  89 92 94 90 89  BUN  --  '19 16 20 23 22  '$ CREATININE  --  1.54* 1.57* 1.74* 1.77* 1.75*  CALCIUM  --  7.9* 8.1* 8.6* 8.7* 8.5*  MG 2.2  --  1.9 2.1 2.2 2.2  PHOS  --   --  2.5  --   --   --    Liver Function Tests: Recent Labs  Lab 08/24/22 1116 08/25/22 0502  AST 17 15  ALT 10 9  ALKPHOS 121 101  BILITOT 0.8 0.7  PROT 7.6 6.1*  ALBUMIN 3.9 3.3*   No results for input(s): "LIPASE", "AMYLASE" in the last 168 hours. No results for input(s): "AMMONIA" in the last 168 hours. CBC: Recent  Labs  Lab 08/24/22 1116 08/25/22 0502 08/26/22 0405 08/30/22 0527  WBC 7.3 6.4 6.7 7.0  NEUTROABS  --   --  2.9  --   HGB 13.7 12.1* 12.1* 12.0*  HCT 41.3 37.1* 36.9* 36.9*  MCV 91.6 91.6 92.5 91.8  PLT 169 150 155 161  Cardiac Enzymes: No results for input(s): "CKTOTAL", "CKMB", "CKMBINDEX", "TROPONINI" in the last 168 hours. BNP: Invalid input(s): "POCBNP" CBG: No results for input(s): "GLUCAP" in the last 168 hours. D-Dimer No results for input(s): "DDIMER" in the last 72 hours. Hgb A1c No results for input(s): "HGBA1C" in the last 72 hours. Lipid Profile No results for input(s): "CHOL", "HDL", "LDLCALC", "TRIG", "CHOLHDL", "LDLDIRECT" in the last 72 hours. Thyroid function studies No results for input(s): "TSH", "T4TOTAL", "T3FREE", "THYROIDAB" in the last 72 hours.  Invalid input(s): "FREET3" Anemia work up No results for input(s): "VITAMINB12", "FOLATE", "FERRITIN", "TIBC", "IRON", "RETICCTPCT" in the last 72 hours. Urinalysis    Component Value Date/Time   COLORURINE YELLOW 08/24/2022 1122   APPEARANCEUR CLOUDY (A) 08/24/2022 1122   LABSPEC 1.010 08/24/2022 1122   PHURINE 7.0 08/24/2022 1122   GLUCOSEU NEGATIVE 08/24/2022 1122   HGBUR MODERATE (A) 08/24/2022 1122   BILIRUBINUR NEGATIVE 08/24/2022 1122   BILIRUBINUR small (A) 10/26/2021 1538   KETONESUR NEGATIVE 08/24/2022 1122   PROTEINUR 100 (A) 08/24/2022 1122   UROBILINOGEN 0.2 10/26/2021 1538   UROBILINOGEN 0.2 07/17/2014 1215   NITRITE POSITIVE (A) 08/24/2022 1122   LEUKOCYTESUR LARGE (A) 08/24/2022 1122   Sepsis Labs Recent Labs  Lab 08/24/22 1116 08/25/22 0502 08/26/22 0405 08/30/22 0527  WBC 7.3 6.4 6.7 7.0   Microbiology Recent Results (from the past 240 hour(s))  Urine Culture     Status: Abnormal (Preliminary result)   Collection Time: 08/24/22 11:22 AM   Specimen: Urine, Catheterized  Result Value Ref Range Status   Specimen Description   Final    URINE, CATHETERIZED Performed at  [facility], [address] [place], [place] [address]    Special Requests   Final    NONE Performed at [facility], [address] [place], [place] [address]    Culture (A)  Final    >=100,000 COLONIES/mL ENTEROCOCCUS FAECALIS Sent to [place] for further susceptibility testing. Performed at [facility], [place] [address], [place], [place] [address]    Report Status PENDING  Incomplete  Susceptibility, Aer + Anaerob     Status: Abnormal   Collection Time: 08/24/22 11:22 AM  Result Value Ref Range Status   Suscept, Aer + Anaerob Preliminary report (A)  Final    Comment: (NOTE) Performed At: [facility] [place], [place] [id number] Rush Farmer MD [id number]    Source of Sample ENTEROCOCCUS.FAECALIS URINE CULTURE  Final    Comment: Performed at [facility], [place] [address] [place], [place] [address]  Susceptibility Result     Status: Abnormal   Collection Time: 08/24/22 11:22 AM  Result Value Ref Range Status   Suscept Result 1 Enterococcus faecalis (A)  Final    Comment: (NOTE) Identification performed by account, not confirmed by this laboratory. Performed At: [facility] [place], [place] [id number] Rush Farmer MD [id number]   Blood Culture (routine x 2)     Status: None   Collection Time: 08/24/22 11:36 AM   Specimen: BLOOD RIGHT HAND  Result Value Ref Range Status   Specimen Description   Final    BLOOD RIGHT HAND BOTTLES DRAWN AEROBIC AND ANAEROBIC   Special Requests Blood Culture adequate volume  Final   Culture   Final    NO GROWTH 5 DAYS Performed at [facility], [address] [place],  [address]    Report Status 08/29/2022 FINAL  Final  Resp Panel by RT-PCR (Flu A&B, Covid)  Anterior Nasal Swab     Status: None   Collection Time: 08/24/22 12:05 PM   Specimen: Anterior Nasal Swab  Result Value Ref Range Status   SARS Coronavirus 2 by RT PCR NEGATIVE NEGATIVE Final    Comment:  (NOTE) SARS-CoV-2 target nucleic acids are NOT DETECTED.  The SARS-CoV-2 RNA is generally detectable in upper respiratory specimens during the acute phase of infection. The lowest concentration of SARS-CoV-2 viral copies this assay can detect is 138 copies/mL. A negative result does not preclude SARS-Cov-2 infection and should not be used as the sole basis for treatment or other patient management decisions. A negative result may occur with  improper specimen collection/handling, submission of specimen other than nasopharyngeal swab, presence of viral mutation(s) within the areas targeted by this assay, and inadequate number of viral copies(<138 copies/mL). A negative result must be combined with clinical observations, patient history, and epidemiological information. The expected result is Negative.  Fact Sheet for Patients:  EntrepreneurPulse.com.au  Fact Sheet for Healthcare Providers:  IncredibleEmployment.be  This test is no t yet approved or cleared by the [place] FDA and  has been authorized for detection and/or diagnosis of SARS-CoV-2 by FDA under an Emergency Use Authorization (EUA). This EUA will remain  in effect (meaning this test can be used) for the duration of the COVID-19 declaration under Section 564(b)(1) of the Act, 21 U.S.C.section 360bbb-3(b)(1), unless the authorization is terminated  or revoked sooner.       Influenza A by PCR NEGATIVE NEGATIVE Final   Influenza B by PCR NEGATIVE NEGATIVE Final    Comment: (NOTE) The Xpert Xpress SARS-CoV-2/FLU/RSV plus assay is intended as an aid in the diagnosis of influenza from Nasopharyngeal swab specimens and should not be used as a sole basis for treatment. Nasal washings and aspirates are unacceptable for Xpert Xpress SARS-CoV-2/FLU/RSV testing.  Fact Sheet for Patients: EntrepreneurPulse.com.au  Fact Sheet for Healthcare  Providers: IncredibleEmployment.be  This test is not yet approved or cleared by the [place] FDA and has been authorized for detection and/or diagnosis of SARS-CoV-2 by FDA under an Emergency Use Authorization (EUA). This EUA will remain in effect (meaning this test can be used) for the duration of the COVID-19 declaration under Section 564(b)(1) of the Act, 21 U.S.C. section 360bbb-3(b)(1), unless the authorization is terminated or revoked.  Performed at [facility], [address], [place], [place] [address]   Blood Culture (routine x 2)     Status: None   Collection Time: 08/24/22  1:34 PM   Specimen: BLOOD LEFT HAND  Result Value Ref Range Status   Specimen Description BLOOD LEFT HAND  Final   Special Requests   Final    BOTTLES DRAWN AEROBIC AND ANAEROBIC Blood Culture adequate volume   Culture   Final    NO GROWTH 5 DAYS Performed at [facility], [address] [place], [place] [address]    Report Status 08/29/2022 FINAL  Final     Time coordinating discharge: 35 minutes  SIGNED:   Rodena Goldmann, DO [facility] 08/30/2022, 9:46 AM  If 7PM-7AM, please contact night-coverage [email or web address]

## 2022-08-30 NOTE — TOC Transition Note (Addendum)
Transition of Care [facility]) - CM/SW Discharge Note   Patient Details  Name: Jordan Ward MRN: [id number] Date of Birth: June 25, 1941  Transition of [facility]) CM/SW Contact:  Iona Beard, [place] Phone Number: 08/30/2022, 10:28 AM   Clinical Narrative:    CSW faxed DC summary to [facility] at request of admissions. CSW provided RN with report number and requested it be called before 11. CSW updated pts spouse of plan for D/C to SNF today. Pts spouse requested that we set up wheelchair Lucianne Lei transport for pt. Transport set up with [employer]. CSW added signed rider waiver to chart. TOC signing off.   Final next level of care: Skilled Nursing Facility Barriers to Discharge: Barriers Resolved   Patient Goals and CMS Choice Patient states their goals for this hospitalization and ongoing recovery are:: go to SNF CMS Medicare.gov Compare Post Acute Care list provided to:: Patient Choice offered to / list presented to : Patient, Spouse  Discharge Placement              Patient [facility] bed at: [facility] Patient to be transferred to facility by: [place] Name of family member notified: Marcha Solders (spouse) Patient and family notified of of transfer: 08/30/22  Discharge Plan and Services In-house Referral: Clinical Social Work   Post Acute Care Choice: Resumption of Svcs/PTA Provider                               Social Determinants of Health (SDOH) Interventions     Readmission Risk Interventions    08/25/2022    3:58 PM 07/15/2021    9:54 AM 02/15/2021   11:47 AM  Readmission Risk Prevention Plan  Transportation Screening Complete    PCP or Specialist Appt within 5-7 Days   Complete  PCP or Specialist Appt within 3-5 Days  Complete   Home Care Screening Complete  Complete  Medication Review (RN CM) Complete  Complete  HRI or [place]  Complete   Social Work Consult for [place] Planning/Counseling  Complete   Palliative Care  Screening  Complete   Medication Review Press [employer])  Complete

## 2022-08-31 DIAGNOSIS — J449 Chronic obstructive pulmonary disease, unspecified: Secondary | ICD-10-CM | POA: Diagnosis not present

## 2022-08-31 DIAGNOSIS — N390 Urinary tract infection, site not specified: Secondary | ICD-10-CM | POA: Diagnosis not present

## 2022-08-31 DIAGNOSIS — R531 Weakness: Secondary | ICD-10-CM | POA: Diagnosis not present

## 2022-08-31 DIAGNOSIS — E038 Other specified hypothyroidism: Secondary | ICD-10-CM | POA: Diagnosis not present

## 2022-08-31 DIAGNOSIS — E876 Hypokalemia: Secondary | ICD-10-CM | POA: Diagnosis not present

## 2022-09-03 LAB — SUSCEPTIBILITY, AER + ANAEROB

## 2022-09-03 LAB — SUSCEPTIBILITY RESULT

## 2022-09-04 LAB — URINE CULTURE: Culture: >=100000 — AB

## 2022-09-09 ENCOUNTER — Ambulatory Visit (HOSPITAL_COMMUNITY)
Admission: RE | Admit: 2022-09-09 | Discharge: 2022-09-09 | Disposition: A | Discharge status: Home / Self Care | Payer: Medicare Other | Source: Ambulatory Visit | Attending: Interventional Radiology | Admitting: Interventional Radiology

## 2022-09-09 ENCOUNTER — Other Ambulatory Visit (HOSPITAL_COMMUNITY): Payer: Self-pay | Admitting: Interventional Radiology

## 2022-09-09 DIAGNOSIS — N319 Neuromuscular dysfunction of bladder, unspecified: Secondary | ICD-10-CM | POA: Insufficient documentation

## 2022-09-09 DIAGNOSIS — Z436 Encounter for attention to other artificial openings of urinary tract: Secondary | ICD-10-CM | POA: Diagnosis not present

## 2022-09-09 DIAGNOSIS — N139 Obstructive and reflux uropathy, unspecified: Secondary | ICD-10-CM | POA: Diagnosis not present

## 2022-09-09 HISTORY — PX: IR NEPHROSTOMY EXCHANGE LEFT: IMG6069

## 2022-09-09 MED ORDER — IOHEXOL 300 MG/ML  SOLN
50.0000 mL | Freq: Once | INTRAMUSCULAR | Status: AC | PRN
  Administered 2022-09-09: 5 mL

## 2022-09-11 ENCOUNTER — Ambulatory Visit (INDEPENDENT_AMBULATORY_CARE_PROVIDER_SITE_OTHER): Payer: Medicare Other | Admitting: Urology

## 2022-09-11 ENCOUNTER — Ambulatory Visit: Payer: Medicare Other

## 2022-09-11 DIAGNOSIS — R339 Retention of urine, unspecified: Secondary | ICD-10-CM | POA: Diagnosis not present

## 2022-09-11 NOTE — Progress Notes (Signed)
Suprapubic Cath Change  Patient is present today for a suprapubic catheter change due to urinary retention.  26m of water was drained from the balloon, a 24FR foley cath was removed from the tract with out difficulty.  Site was cleaned and prepped in a sterile fashion with betadine.  A 24FR foley cath was replaced into the tract no complications were noted. Urine return was noted, 10 ml of sterile water was inflated into the balloon and a leg bag was attached for drainage.  Patient tolerated well. A night bag was given to patient and proper instruction was given on how to switch bags.    Performed by: Yuritza Paulhus LPN  Follow up: keep scheduled [place]

## 2022-09-12 ENCOUNTER — Other Ambulatory Visit: Payer: Self-pay | Admitting: *Deleted

## 2022-09-12 NOTE — Patient Outreach (Signed)
Per [place] Mr. Dietrick resides in [facility] [place]. Screening for potential [facility] care coordination services as benefit of insurance plan and PCP.   Secure communication sent to SNF social worker to make aware writer is following for transition plans and potential [facility] needs.   Will continue to follow.   Marthenia Rolling, MSN, RN,BSN [place] Acute Care Coordinator [phone] (Direct dial)

## 2022-09-16 DIAGNOSIS — I480 Paroxysmal atrial fibrillation: Secondary | ICD-10-CM | POA: Diagnosis not present

## 2022-09-16 DIAGNOSIS — D631 Anemia in chronic kidney disease: Secondary | ICD-10-CM | POA: Diagnosis not present

## 2022-09-16 DIAGNOSIS — N319 Neuromuscular dysfunction of bladder, unspecified: Secondary | ICD-10-CM | POA: Diagnosis not present

## 2022-09-16 DIAGNOSIS — N179 Acute kidney failure, unspecified: Secondary | ICD-10-CM | POA: Diagnosis not present

## 2022-09-16 DIAGNOSIS — M549 Dorsalgia, unspecified: Secondary | ICD-10-CM | POA: Diagnosis not present

## 2022-09-16 DIAGNOSIS — G8929 Other chronic pain: Secondary | ICD-10-CM | POA: Diagnosis not present

## 2022-09-16 DIAGNOSIS — Z436 Encounter for attention to other artificial openings of urinary tract: Secondary | ICD-10-CM | POA: Diagnosis not present

## 2022-09-16 DIAGNOSIS — G35 Multiple sclerosis: Secondary | ICD-10-CM | POA: Diagnosis not present

## 2022-09-16 DIAGNOSIS — N390 Urinary tract infection, site not specified: Secondary | ICD-10-CM | POA: Diagnosis not present

## 2022-09-16 DIAGNOSIS — F411 Generalized anxiety disorder: Secondary | ICD-10-CM | POA: Diagnosis not present

## 2022-09-16 DIAGNOSIS — Z435 Encounter for attention to cystostomy: Secondary | ICD-10-CM | POA: Diagnosis not present

## 2022-09-16 DIAGNOSIS — J449 Chronic obstructive pulmonary disease, unspecified: Secondary | ICD-10-CM | POA: Diagnosis not present

## 2022-09-16 DIAGNOSIS — E785 Hyperlipidemia, unspecified: Secondary | ICD-10-CM | POA: Diagnosis not present

## 2022-09-16 DIAGNOSIS — E039 Hypothyroidism, unspecified: Secondary | ICD-10-CM | POA: Diagnosis not present

## 2022-09-16 DIAGNOSIS — Z466 Encounter for fitting and adjustment of urinary device: Secondary | ICD-10-CM | POA: Diagnosis not present

## 2022-09-16 DIAGNOSIS — I4719 Other supraventricular tachycardia: Secondary | ICD-10-CM | POA: Diagnosis not present

## 2022-09-16 DIAGNOSIS — I69354 Hemiplegia and hemiparesis following cerebral infarction affecting left non-dominant side: Secondary | ICD-10-CM | POA: Diagnosis not present

## 2022-09-16 DIAGNOSIS — I739 Peripheral vascular disease, unspecified: Secondary | ICD-10-CM | POA: Diagnosis not present

## 2022-09-16 DIAGNOSIS — M1990 Unspecified osteoarthritis, unspecified site: Secondary | ICD-10-CM | POA: Diagnosis not present

## 2022-09-16 DIAGNOSIS — I2510 Atherosclerotic heart disease of native coronary artery without angina pectoris: Secondary | ICD-10-CM | POA: Diagnosis not present

## 2022-09-16 DIAGNOSIS — R1310 Dysphagia, unspecified: Secondary | ICD-10-CM | POA: Diagnosis not present

## 2022-09-16 DIAGNOSIS — I129 Hypertensive chronic kidney disease with stage 1 through stage 4 chronic kidney disease, or unspecified chronic kidney disease: Secondary | ICD-10-CM | POA: Diagnosis not present

## 2022-09-16 DIAGNOSIS — N183 Chronic kidney disease, stage 3 unspecified: Secondary | ICD-10-CM | POA: Diagnosis not present

## 2022-09-16 DIAGNOSIS — Z9181 History of falling: Secondary | ICD-10-CM | POA: Diagnosis not present

## 2022-09-16 DIAGNOSIS — E876 Hypokalemia: Secondary | ICD-10-CM | POA: Diagnosis not present

## 2022-09-17 ENCOUNTER — Other Ambulatory Visit: Payer: Self-pay | Admitting: *Deleted

## 2022-09-17 DIAGNOSIS — N184 Chronic kidney disease, stage 4 (severe): Secondary | ICD-10-CM | POA: Diagnosis not present

## 2022-09-17 DIAGNOSIS — G35 Multiple sclerosis: Secondary | ICD-10-CM

## 2022-09-17 DIAGNOSIS — N179 Acute kidney failure, unspecified: Secondary | ICD-10-CM | POA: Diagnosis not present

## 2022-09-17 DIAGNOSIS — Z436 Encounter for attention to other artificial openings of urinary tract: Secondary | ICD-10-CM | POA: Diagnosis not present

## 2022-09-17 DIAGNOSIS — M87859 Other osteonecrosis, unspecified femur: Secondary | ICD-10-CM | POA: Diagnosis not present

## 2022-09-17 DIAGNOSIS — Z466 Encounter for fitting and adjustment of urinary device: Secondary | ICD-10-CM | POA: Diagnosis not present

## 2022-09-17 DIAGNOSIS — M87059 Idiopathic aseptic necrosis of unspecified femur: Secondary | ICD-10-CM | POA: Diagnosis not present

## 2022-09-17 DIAGNOSIS — N319 Neuromuscular dysfunction of bladder, unspecified: Secondary | ICD-10-CM | POA: Diagnosis not present

## 2022-09-17 DIAGNOSIS — N390 Urinary tract infection, site not specified: Secondary | ICD-10-CM | POA: Diagnosis not present

## 2022-09-17 DIAGNOSIS — I739 Peripheral vascular disease, unspecified: Secondary | ICD-10-CM | POA: Diagnosis not present

## 2022-09-17 DIAGNOSIS — Z435 Encounter for attention to cystostomy: Secondary | ICD-10-CM | POA: Diagnosis not present

## 2022-09-17 DIAGNOSIS — E876 Hypokalemia: Secondary | ICD-10-CM | POA: Diagnosis not present

## 2022-09-17 NOTE — Patient Outreach (Signed)
[place] Coordinator follow up. Verified in [facility] Mr. Danielsen discharged from [facility] on 09/14/22.   Verified with SNF social worker Mr. Hamelin returned home with spouse. [facility] was arranged.   Mr. Sennett has medical history of multiple sclerosis with neurogenic bladder s/p suprapubic catheter, left percutaneous nephrostomy tube in place, HTN, HLD, CAD, PAD, paroxysmal atrial tachycardia, stroke with left-sided weakness, chronic anemia, arthritis, chronic back pain, CKD III.  Will plan to make referral to [facility] care coordination team.   Marthenia Rolling, MSN, RN,BSN [place] Acute Care Coordinator [phone] (Direct dial)

## 2022-09-18 DIAGNOSIS — Z466 Encounter for fitting and adjustment of urinary device: Secondary | ICD-10-CM | POA: Diagnosis not present

## 2022-09-18 DIAGNOSIS — N179 Acute kidney failure, unspecified: Secondary | ICD-10-CM | POA: Diagnosis not present

## 2022-09-18 DIAGNOSIS — N319 Neuromuscular dysfunction of bladder, unspecified: Secondary | ICD-10-CM | POA: Diagnosis not present

## 2022-09-18 DIAGNOSIS — Z436 Encounter for attention to other artificial openings of urinary tract: Secondary | ICD-10-CM | POA: Diagnosis not present

## 2022-09-18 DIAGNOSIS — Z435 Encounter for attention to cystostomy: Secondary | ICD-10-CM | POA: Diagnosis not present

## 2022-09-18 DIAGNOSIS — N390 Urinary tract infection, site not specified: Secondary | ICD-10-CM | POA: Diagnosis not present

## 2022-09-19 ENCOUNTER — Telehealth: Payer: Self-pay | Admitting: *Deleted

## 2022-09-19 NOTE — Progress Notes (Signed)
  Care Coordination   Note   09/19/2022 Name: Jordan Ward MRN: [id number] DOB: [birth year]  Jordan Ward is a 81 y.o. year old male who sees [place] Crane, Edwinna Areola, MD for primary care. I reached out to [name] by phone today to offer care coordination services.  [name] was given information about Care Coordination services today including:   The Care Coordination services include support from the care team which includes your Nurse Coordinator, Clinical Social Worker, or Pharmacist.  The Care Coordination team is here to help remove barriers to the health concerns and goals most important to you. Care Coordination services are voluntary, and the patient may decline or stop services at any time by request to their care team member.   Care Coordination Consent Status: Patient agreed to services and verbal consent obtained.   Follow up plan:  Telephone appointment with care coordination team member scheduled for:  09/26/2022  Encounter Outcome:  Pt. Scheduled from referral   Julian Hy, [place] Direct Dial: [phone]

## 2022-09-23 ENCOUNTER — Other Ambulatory Visit (HOSPITAL_COMMUNITY): Payer: Medicare Other

## 2022-09-24 ENCOUNTER — Other Ambulatory Visit (HOSPITAL_COMMUNITY): Payer: Medicare Other

## 2022-09-25 DIAGNOSIS — M79674 Pain in right toe(s): Secondary | ICD-10-CM | POA: Diagnosis not present

## 2022-09-25 DIAGNOSIS — Z436 Encounter for attention to other artificial openings of urinary tract: Secondary | ICD-10-CM | POA: Diagnosis not present

## 2022-09-25 DIAGNOSIS — Z466 Encounter for fitting and adjustment of urinary device: Secondary | ICD-10-CM | POA: Diagnosis not present

## 2022-09-25 DIAGNOSIS — Z435 Encounter for attention to cystostomy: Secondary | ICD-10-CM | POA: Diagnosis not present

## 2022-09-25 DIAGNOSIS — B351 Tinea unguium: Secondary | ICD-10-CM | POA: Diagnosis not present

## 2022-09-25 DIAGNOSIS — N179 Acute kidney failure, unspecified: Secondary | ICD-10-CM | POA: Diagnosis not present

## 2022-09-25 DIAGNOSIS — M79675 Pain in left toe(s): Secondary | ICD-10-CM | POA: Diagnosis not present

## 2022-09-25 DIAGNOSIS — N390 Urinary tract infection, site not specified: Secondary | ICD-10-CM | POA: Diagnosis not present

## 2022-09-25 DIAGNOSIS — N319 Neuromuscular dysfunction of bladder, unspecified: Secondary | ICD-10-CM | POA: Diagnosis not present

## 2022-09-26 ENCOUNTER — Encounter: Payer: Self-pay | Admitting: *Deleted

## 2022-09-26 ENCOUNTER — Ambulatory Visit: Payer: Self-pay | Admitting: *Deleted

## 2022-09-26 DIAGNOSIS — Z466 Encounter for fitting and adjustment of urinary device: Secondary | ICD-10-CM | POA: Diagnosis not present

## 2022-09-26 DIAGNOSIS — N390 Urinary tract infection, site not specified: Secondary | ICD-10-CM | POA: Diagnosis not present

## 2022-09-26 DIAGNOSIS — N179 Acute kidney failure, unspecified: Secondary | ICD-10-CM | POA: Diagnosis not present

## 2022-09-26 DIAGNOSIS — Z435 Encounter for attention to cystostomy: Secondary | ICD-10-CM | POA: Diagnosis not present

## 2022-09-26 DIAGNOSIS — Z436 Encounter for attention to other artificial openings of urinary tract: Secondary | ICD-10-CM | POA: Diagnosis not present

## 2022-09-26 DIAGNOSIS — N319 Neuromuscular dysfunction of bladder, unspecified: Secondary | ICD-10-CM | POA: Diagnosis not present

## 2022-09-26 NOTE — Patient Instructions (Signed)
Visit Information  Thank you for taking time to visit with me today. Please don't hesitate to contact me if I can be of assistance to you.   Following are the goals we discussed today:   Goals Addressed               This Visit's Progress     Patient Stated     improve mobility [facility]) (pt-stated)   Not on track     Care Coordination Interventions: Evaluation of current treatment plan related to MS, mobility and new walker  and patient's adherence to plan as established by provider Reviewed scheduled/upcoming provider appointments including pcp pending Discussed plans with patient for ongoing care management follow up and provided patient with direct contact information for care management team Screening for signs and symptoms of depression related to chronic disease state  Assessed social determinant of health barriers Message sent to Smitty Knudsen for request for walker order with height and weight provided         Our next appointment is by telephone on 12/26/22 at 1:30 pm  Please call the care guide team at [phone] if you need to cancel or reschedule your appointment.   If you are experiencing a Mental Health or [place] or need someone to talk to, please call the Suicide and Crisis Lifeline: 988 call the [place] National Suicide Prevention Lifeline: 952-491-8191 or TTY: (613)096-7804 TTY 319-816-4106) to talk to a trained counselor call 1-800-273-TALK (toll free, 24 hour hotline) call the [facility]: (410)705-6384 call 911   Patient verbalizes understanding of instructions and care plan provided today and agrees to view in [place]. Active MyChart status and patient understanding of how to access instructions and care plan via MyChart confirmed with patient.     The patient has been provided with contact information for the care management team and has been advised to call with any health related questions or concerns.     Espn Zeman L. Lavina Hamman,  RN, BSN, [place] Coordinator Office number [phone]

## 2022-09-26 NOTE — Patient Outreach (Signed)
  Care Coordination   Initial Visit Note   09/26/2022 Name: Jordan Ward MRN: [id number] DOB: Jan 11, 1941  Jordan Ward is a 81 y.o. year old male who sees Jordan Ward, Jordan Areola, MD for primary care. I spoke with  Jordan Ward by phone today.  What matters to the patients health and wellness today?  Recent discharge from [facility] skilled nursing facility on 09/16/22 after 08/24/22-08/30/22 admission for UTI, MS, dysphagia, chronic kidney disease  Therapy is being received from [facility] home [facility] Need new walker ht 56.5 114 lbs  Was on pureed foods now taking in solid in small pieces  Jordan [facility] from [facility] through Dr Juel Burrow office. Jordan Ward inquired about multiple services She states she is fine with [facility] home visits and [facility] telephonic visits Jordan Ward confirms there are no other medical needs at this time   Goals Addressed               This Visit's Progress     Patient Stated     improve mobility [facility]) (pt-stated)   Not on track     Care Coordination Interventions: Evaluation of current treatment plan related to MS, mobility and new walker  and patient's adherence to plan as established by provider Reviewed scheduled/upcoming provider appointments including pcp pending Discussed plans with patient for ongoing care management follow up and provided patient with direct contact information for care management team Screening for signs and symptoms of depression related to chronic disease state  Assessed social determinant of health barriers Message sent to Jordan Ward for request for walker order with height and weight provided         SDOH assessments and interventions completed:  Yes  SDOH Interventions Today    Flowsheet Row Most Recent Value  SDOH Interventions   Food Insecurity Interventions Intervention Not Indicated  Transportation Interventions Intervention Not Indicated  Utilities Interventions Intervention Not Indicated  Financial Strain Interventions  Intervention Not Indicated  Stress Interventions Intervention Not Indicated        Care Coordination Interventions:  No, not indicated   Follow up plan: Follow up call scheduled for 12/26/22    Encounter Outcome:  Pt. Visit Completed   Jordan Ward L. Lavina Hamman, RN, BSN, [place] Coordinator Office number [phone]

## 2022-09-30 DIAGNOSIS — N179 Acute kidney failure, unspecified: Secondary | ICD-10-CM | POA: Diagnosis not present

## 2022-09-30 DIAGNOSIS — N390 Urinary tract infection, site not specified: Secondary | ICD-10-CM | POA: Diagnosis not present

## 2022-09-30 DIAGNOSIS — Z435 Encounter for attention to cystostomy: Secondary | ICD-10-CM | POA: Diagnosis not present

## 2022-09-30 DIAGNOSIS — Z436 Encounter for attention to other artificial openings of urinary tract: Secondary | ICD-10-CM | POA: Diagnosis not present

## 2022-09-30 DIAGNOSIS — Z466 Encounter for fitting and adjustment of urinary device: Secondary | ICD-10-CM | POA: Diagnosis not present

## 2022-09-30 DIAGNOSIS — N319 Neuromuscular dysfunction of bladder, unspecified: Secondary | ICD-10-CM | POA: Diagnosis not present

## 2022-10-02 ENCOUNTER — Ambulatory Visit (INDEPENDENT_AMBULATORY_CARE_PROVIDER_SITE_OTHER): Payer: Medicare Other | Admitting: Neurology

## 2022-10-02 ENCOUNTER — Encounter: Payer: Self-pay | Admitting: Neurology

## 2022-10-02 VITALS — BP 192/82 | HR 64 | Ht 65.0 in | Wt 118.5 lb

## 2022-10-02 DIAGNOSIS — R6889 Other general symptoms and signs: Secondary | ICD-10-CM

## 2022-10-02 DIAGNOSIS — G35 Multiple sclerosis: Secondary | ICD-10-CM

## 2022-10-02 DIAGNOSIS — I679 Cerebrovascular disease, unspecified: Secondary | ICD-10-CM | POA: Diagnosis not present

## 2022-10-02 NOTE — Patient Instructions (Signed)
Continue to see your primary care doctor Be safe with walking Good management of vascular risk factors  See you back in 6 months

## 2022-10-02 NOTE — Progress Notes (Signed)
PATIENT: Jordan Ward DOB: 1940-11-20  REASON FOR VISIT: Follow up for MS HISTORY FROM: Patient and his wife  PRIMARY NEUROLOGIST: [name]   HISTORY OF PRESENT ILLNESS: Today 10/02/22 Hospitalized 08/24/22-08/30/22 for significant UTI has left nephrostomy tube in place, suprapubic catheter. That morning his hands and hands were shaky, similar to rigors in the past.  CT head 08/24/22 showed stability of advanced small vessel disease. Did 2 weeks of Rehab at [facility] in [place]. Improved his strength. Using walker, 2 wheels on front, tennis balls on back legs. Rolling walking is too fast. Remains on Valium for muscle spasms, twice daily. Cut back on hydrocodone to 1 tablet daily. Comes from PCP. His wife has mentioned some mood issues, he gets frustrated, can be short. Has a bad attitude. He does his own ADLs. He doesn't drive.   Update 11/20/21 SS: Jordan Ward here today with follow-up for history of MS, not on any disease modifying agents.  History of tremor at the time of illness on 2 separate hospital admissions.  Felt to be rigors related to the infection. Has nephrostomy tube now. Has poor energy, he chooses not to get around per his wife, mostly sits on his phone. Uses walker, 2 near falls, his wife has caught him twice. Uses oxygen at night. Has OSA, stopped using CPAP. Takes Valium 5 mg twice daily for muscle spasms in legs. His wife thinks he is doing fairly well. On hydrocodone from PCP for chronic pain. Reviewed drug registry last filled Valium 11/07/21. They have to take a Lucianne Lei to [place], his wife doesn't drive here.   HISTORY  03/07/2021 Dr. Jannifer Franklin: Jordan Ward is a 82 year old right-handed white male with a history of multiple sclerosis.  He currently is not on any disease modifying agents.  The patient has a neurogenic bladder, he has a suprapubic catheter in place, he has had problems with hydronephrosis and has had stent placements previously.  He comes in for an evaluation of  tremor.  He has had episodes of severe tremor that have occurred on 2 occasions.  The patient was admitted to the hospital on 05 September 2020 with a fever and left flank pain and tachycardia.  He had a white count of 18.4..  He had bacterial sepsis with Enterococcus and Pseudomonas.  He had at least a 2-day event of severe shaking chills during this event.  He was admitted to the hospital again on 13 Feb 2021 with a similar issue.  He was running a low-grade temperature, but the blood cultures were negative at that time.  The patient was treated with antibiotics.  He had 3 days of shaking chills once again.  The patient had difficulty with speaking and walking during these hard shaking chills.  After the infection was treated on both occasions, the tremors disappeared.  The patient has had ongoing left flank pain.  He is sent to this office for further evaluation.  He had COVID and October 2021, he has had significant fatigue issues and decline in his ability to ambulate since that time.  He has been on oxygen continuously until just recently when he was converted to oxygen just in the evening.  He is now walking with a walker since the COVID infection.  He never got vaccinated for COVID.  He comes here for further evaluation.  He denies any new numbness or weakness of the face, arms, legs.  He denies any vision changes or difficulty with swallowing.  He is getting  physical therapy in the home environment.  REVIEW OF SYSTEMS: Out of a complete 14 system review of symptoms, the patient complains only of the following symptoms, and all other reviewed systems are negative.  See HPI  ALLERGIES: Allergies  Allergen Reactions   Tetracyclines & Related Anaphylaxis and Rash   Cipro [Ciprofloxacin Hcl] Other (See Comments)    "[place] out in the floor"    HOME MEDICATIONS: Outpatient Medications Prior to Visit  Medication Sig Dispense Refill   acetaminophen (TYLENOL) 325 MG tablet Take 650 mg by mouth every 6  (six) hours as needed for mild pain, moderate pain, fever or headache.     amLODipine (NORVASC) 10 MG tablet Take 5 mg by mouth every evening. After supper.     Carboxymethylcellulose Sodium (DRY EYE RELIEF OP) Place 1 drop into both eyes 4 (four) times daily as needed (dry eyes).     diazepam (VALIUM) 5 MG tablet Take 1 tablet (5 mg total) by mouth 2 (two) times daily. 6 tablet 0   HYDROcodone-acetaminophen (NORCO) 7.5-325 MG tablet Take 1 tablet by mouth 2 (two) times daily. Max APAP 3 GM IN 24 HOURS FROM ALL SOURCES 6 tablet 0   levothyroxine (SYNTHROID) 75 MCG tablet Take 75 mcg by mouth in the morning.     loperamide (IMODIUM A-D) 2 MG tablet Take 1 tablet (2 mg total) by mouth 4 (four) times daily as needed for diarrhea or loose stools. 30 tablet 0   OXYGEN Inhale 1 L into the lungs every evening.     potassium chloride SA (KLOR-CON) 20 MEQ tablet Take 20 mEq by mouth every evening. After supper     amoxicillin (AMOXIL) 500 MG capsule Take 500 mg by mouth 2 (two) times daily.     No facility-administered medications prior to visit.    PAST MEDICAL HISTORY: Past Medical History:  Diagnosis Date   Anemia    Arthritis    Back pain, chronic    Bilateral carotid bruits    C. difficile colitis 09/2011   CAD (coronary artery disease)    Carotid artery stenosis    Cerebrovascular disease    Cerebrovascular disease 08/17/2018   Colon polyps    Complication of cystostomy catheter, initial encounter ([place]) 04/20/2020   Dyslipidemia    Dysphagia 10/07/2011   Encephalopathy    Gait disorder    HA (headache)    High grade dysplasia in colonic adenoma 09/2005   History of kidney stones    HTN (hypertension), malignant 10/06/2011   Hypernatremia    Hypokalemia    Hypothyroidism 10/08/2011   Insomnia    Junctional rhythm    Kidney stones    MS (multiple sclerosis) (HCC)    Neuromuscular disorder (HCC)    MS   OSA (obstructive sleep apnea)    Paroxysmal atrial tachycardia    Peripheral  vascular disease ([place])    Pneumonia 4 yrs ago   Pulmonary fibrosis ([place]) 10/06/2011   Pulmonary nodule 10/08/2011   PVD (peripheral vascular disease) ([place])    Sacral ulcer (HCC)    Sleep apnea    cannot tolerate   Stroke ([place])    left sided weakness   Suprapubic catheter ([place])    TIA (transient ischemic attack)    Tremors of nervous system 10/08/2011   Urinary tract infection    Ventral hernia without obstruction or gangrene     Large 8X9cm ventral hernia with loss of domain. CT reads report as diastasis recti with herniation or diastasis recti.  Dr. Constance Haw, Surgery, reviewed CT with radiology and there is herniation with only hernia sac or peritoneum over the bowel and large separation of the rectus muslce (i.e. diastasis recti aka loss of domain).  No surgical intervention recommended given size, age, and health.     PAST SURGICAL HISTORY: Past Surgical History:  Procedure Laterality Date   APPENDECTOMY  09/2005   at time of left hemicolectomy   BACK SURGERY  1976/1979   lower   BILIARY DILATION N/A 03/03/2020   Procedure: BILIARY DILATION;  Surgeon: Rogene Houston, MD;  [facility];  Service: Endoscopy;  Laterality: N/A;   CATARACT EXTRACTION W/PHACO Right 03/08/2018   Procedure: CATARACT EXTRACTION PHACO AND INTRAOCULAR LENS PLACEMENT RIGHT EYE;  Surgeon: Tonny Branch, MD;  Location: [facility];  Service: Ophthalmology;  Laterality: Right;  CDE: 8.86   CATARACT EXTRACTION W/PHACO Left 04/05/2018   Procedure: CATARACT EXTRACTION PHACO AND INTRAOCULAR LENS PLACEMENT (IOC);  Surgeon: Tonny Branch, MD;  Location: [facility];  Service: Ophthalmology;  Laterality: Left;  CDE: 7.36   CENTRAL LINE INSERTION-TUNNELED Right 09/11/2020   Procedure: PLACEMENT OF TUNNELED CENTRAL LINE INTO JUGULAR VEIN;  Surgeon: Virl Cagey, MD;  Location: [facility];  Service: General;  Laterality: Right;   CHOLECYSTECTOMY     Dr. Tamala Julian   COLON SURGERY  09/2005   Fleishman: four tubular adenomas, large  adenomatous polyp with HIGH GRADE dysplasia   COLONOSCOPY  11/2004   Dr. Sharol Roussel sessile polyp splenic flexure, 30m sessile polyp desc colon, tubulovillous adenoma (bx not removed)   COLONOSCOPY  01/2005   poor prep, polyp could not be found   COLONOSCOPY  05/2005   with EMR, polypectomy Dr. JOlegario Messier bx showed high grade dysplasia, partially resected   COLONOSCOPY  09/2005   Dr. GArsenio Loader [employer] tattooing, four villous colon polyp (3 had been missed on previous colonoscopies due to limitations of procedures   COLONOSCOPY  09/2006   normal TI, no polyps   COLONOSCOPY  10/2007   Dr. RImogene Burndistal mammillations, benign bx, normal TI, random bx neg for microscopic colitis   CYSTOSCOPY W/ URETERAL STENT PLACEMENT Left 09/05/2020   Procedure: CYSTOSCOPY WITH RETROGRADE PYELOGRAM/URETERAL STENT PLACEMENT;  Surgeon: HArdis Hughs MD;  Location: [facility];  Service: Urology;  Laterality: Left;   CYSTOSCOPY WITH LITHOLAPAXY N/A 07/27/2018   Procedure: CYSTOSCOPY WITH LITHOLAPAXY VIA  SUPRAPUBIC TUBE;  Surgeon: DFranchot Gallo MD;  Location: [facility];  Service: Urology;  Laterality: N/A;   CYSTOSCOPY WITH RETROGRADE PYELOGRAM, URETEROSCOPY AND STENT PLACEMENT Left 06/09/2017   Procedure: CYSTOSCOPY WITH LEFT RETROGRADE PYELOGRAM, LEFT URETEROSCOPY, LEFT URETEROSCOPIC STONE EXTRACTION, LEFT URETERAL STENT PLACEMENT;  Surgeon: DFranchot Gallo MD;  Location: [facility];  Service: Urology;  Laterality: Left;   CYSTOSCOPY/URETEROSCOPY/HOLMIUM LASER/STENT PLACEMENT Left 05/22/2020   Procedure: CYSTOSCOPY/URETEROSCOPY/STENT PLACEMENT;  Surgeon: DFranchot Gallo MD;  Location: [facility];  Service: Urology;  Laterality: Left;   ERCP N/A 03/03/2020   Procedure: ENDOSCOPIC RETROGRADE CHOLANGIOPANCREATOGRAPHY (ERCP);  Surgeon: RRogene Houston MD;  [facility];  Service: Endoscopy;  Laterality: N/A;   ERCP N/A 04/20/2020   Procedure: ENDOSCOPIC RETROGRADE CHOLANGIOPANCREATOGRAPHY  (ERCP);  Surgeon: RRogene Houston MD;  [facility];  Service: Endoscopy;  Laterality: N/A;  [place]   Procedure: STENT REMOVAL;  Surgeon: RRogene Houston MD;  [facility];  Service: Endoscopy;  Laterality: N/A;   INGUINAL HERNIA REPAIR  1971   bilateral   INSERTION OF SUPRAPUBIC CATHETER  06/09/2017   Procedure: EXCHANGE OF SUPRAPUBIC CATHETER;  Surgeon: Franchot Gallo, MD;  Location: [facility];  Service: Urology;;   INSERTION OF SUPRAPUBIC CATHETER  05/22/2020   Procedure: SUPRAPUBIC CATHETER EXCHANGE;  Surgeon: Franchot Gallo, MD;  Location: [facility];  Service: Urology;;   IR NEPHROSTOMY EXCHANGE LEFT  03/25/2021   IR NEPHROSTOMY EXCHANGE LEFT  05/20/2021   IR NEPHROSTOMY EXCHANGE LEFT  07/13/2021   IR NEPHROSTOMY EXCHANGE LEFT  09/09/2021   IR NEPHROSTOMY EXCHANGE LEFT  11/04/2021   IR NEPHROSTOMY EXCHANGE LEFT  12/23/2021   IR NEPHROSTOMY EXCHANGE LEFT  02/17/2022   IR NEPHROSTOMY EXCHANGE LEFT  04/14/2022   IR NEPHROSTOMY EXCHANGE LEFT  06/09/2022   IR NEPHROSTOMY EXCHANGE LEFT  07/28/2022   IR NEPHROSTOMY EXCHANGE LEFT  09/09/2022   IR NEPHROSTOMY PLACEMENT LEFT  05/26/2017   IR NEPHROSTOMY PLACEMENT LEFT  05/02/2020   IR NEPHROSTOMY PLACEMENT LEFT  02/15/2021   IR URETERAL STENT PERC REMOVAL MOD SED  03/25/2021   KIDNEY STONE SURGERY  09/13/2015   LITHOTRIPSY N/A 03/03/2020   Procedure: MECHANICAL LITHOTRIPSY WITH REMOVAL OF MULTIPLE STONE FRAGMENTS;  Surgeon: Rogene Houston, MD;  [facility];  Service: Endoscopy;  Laterality: N/A;   NEPHROLITHOTOMY Left 09/13/2015   Procedure: LEFT PERCUTANEOUS NEPHROLITHOTOMY ;  Surgeon: Franchot Gallo, MD;  Location: [facility];  Service: Urology;  Laterality: Left;   NEPHROSTOMY TUBE REMOVAL  05/22/2020   Procedure: NEPHROSTOMY TUBE REMOVAL;  Surgeon: Franchot Gallo, MD;  Location: [facility];  Service: Urology;;   [facility] REMOVAL Right 09/20/2020    Procedure: MINOR REMOVAL CENTRAL LINE;  Surgeon: Virl Cagey, MD;  Location: [facility];  Service: General;  Laterality: Right;  Pt to arrive at 7:30am for procedure   REMOVAL OF STONES N/A 04/20/2020   Procedure: REMOVAL OF STONES;  Surgeon: Rogene Houston, MD;  [facility];  Service: Endoscopy;  Laterality: N/A;   SPHINCTEROTOMY  03/03/2020   Procedure: BILLARY SPHINCTEROTOMY;  Surgeon: Rogene Houston, MD;  [facility];  Service: Endoscopy;;   SUPRAPUBIC CATHETER INSERTION      FAMILY HISTORY: Family History  Problem Relation Age of Onset   Cirrhosis Brother        etoh   Stroke Mother 67   Coronary artery disease Father 53   Heart attack Brother    Cancer Sister    Multiple sclerosis Other    Colon cancer Neg Hx     SOCIAL HISTORY: Social History   Socioeconomic History   Marital status: Married    Spouse name: Pricilla Holm   Number of children: 2   Years of education: 10   Highest education level: Not on file  Occupational History   Occupation: disability    Comment: [employer]    Employer: RETIRED  Tobacco Use   Smoking status: Former    Packs/day: 1.00    Years: 25.00    Total pack years: 25.00    Types: Cigarettes    Quit date: 03/03/1988    Years since quitting: 34.6   Smokeless tobacco: Never  Vaping Use   Vaping Use: Never used  Substance and Sexual Activity   Alcohol use: No   Drug use: No   Sexual activity: Not on file  Other Topics Concern   Not on file  Social History Narrative   Patient is married Pricilla Holm) and  Lives w/ wife and daughter Ivin Booty  Drinks 1-2 cups caffeine daily    Patient is left handed.    Social Determinants of Health   Financial Resource Strain: Low Risk  (09/26/2022)   Overall Financial Resource Strain (CARDIA)    Difficulty of Paying Living Expenses: Not hard at all  Food Insecurity: No Food Insecurity (09/26/2022)   Hunger Vital Sign    Worried About Running Out of Food in the Last Year:  Never true    Ran Out of Food in the Last Year: Never true  Transportation Needs: No Transportation Needs (09/26/2022)   PRAPARE - Hydrologist (Medical): No    Lack of Transportation (Non-Medical): No  Physical Activity: Not on file  Stress: No Stress Concern Present (09/26/2022)   [place]    Feeling of Stress : Only a little  Social Connections: Not on file  Intimate Partner Violence: Not At Risk (08/24/2022)   Humiliation, Afraid, Rape, and Kick questionnaire    Fear of Current or Ex-Partner: No    Emotionally Abused: No    Physically Abused: No    Sexually Abused: No   PHYSICAL EXAM  Vitals:   10/02/22 1008  BP: (!) 192/82  Pulse: 64  Weight: 118 lb 8 oz (53.8 kg)  Height: '5\' 5"'$  (1.651 m)    Body mass index is 19.72 kg/m.  Generalized: Well developed, in no acute distress, elderly male, appears generally deconditioned  Neurological examination  Mentation: Alert oriented to time, place, most history is provided by his wife. Follows all commands, speech is mumbled Cranial nerve II-XII: Pupils were equal round reactive to light. Extraocular movements were full, visual field were full on confrontational test. Facial sensation and strength were normal. Head turning and shoulder shrug were normal and symmetric. Motor: No significant muscle weakness noted, generally deconditioned.  Sensory: Sensory testing is intact to soft touch on all 4 extremities. No evidence of extinction is noted.  Coordination: Cerebellar testing reveals good finger-nose-finger and heel-to-shin bilaterally.  Gait and station: gait is wide based,  uses walker in room and hallway Reflexes: Deep tendon reflexes are symmetric and normal bilaterally.   DIAGNOSTIC DATA (LABS, IMAGING, TESTING) - I reviewed patient records, labs, notes, testing and imaging myself where available.  Lab Results  Component Value  Date   WBC 7.0 08/30/2022   HGB 12.0 (L) 08/30/2022   HCT 36.9 (L) 08/30/2022   MCV 91.8 08/30/2022   PLT 161 08/30/2022      Component Value Date/Time   NA 137 08/30/2022 0527   NA 136 (A) 09/27/2018 0000   K 3.5 08/30/2022 0527   CL 107 08/30/2022 0527   CO2 25 08/30/2022 0527   GLUCOSE 89 08/30/2022 0527   BUN 22 08/30/2022 0527   BUN 28 (A) 09/27/2018 0000   CREATININE 1.75 (H) 08/30/2022 0527   CALCIUM 8.5 (L) 08/30/2022 0527   PROT 6.1 (L) 08/25/2022 0502   ALBUMIN 3.3 (L) 08/25/2022 0502   AST 15 08/25/2022 0502   ALT 9 08/25/2022 0502   ALKPHOS 101 08/25/2022 0502   BILITOT 0.7 08/25/2022 0502   GFRNONAA 39 (L) 08/30/2022 0527   GFRAA 43 (L) 05/07/2020 0521   Lab Results  Component Value Date   CHOL 133 09/27/2018   HDL 39 09/27/2018   LDLCALC 72 09/27/2018   TRIG 108 07/03/2020   CHOLHDL 4.4 08/05/2015   Lab Results  Component Value Date   HGBA1C 5.9 (H) 02/14/2021  Lab Results  Component Value Date   VITAMINB12 405 09/08/2020   Lab Results  Component Value Date   TSH 1.405 08/24/2022   ASSESSMENT AND PLAN 82 y.o. year old male   1.  Multiple sclerosis vs cerebrovascular disease  2.  Gait disorder 3.  Neurogenic bladder, left nephrostomy tube, suprapubic catheter 4.  History of tremors, likely rigors during significant illness with UTI and sepsis  -Continues to experience gradual decline, has had multiple admissions for significant UTI with sepsis -He is not on DMT for MS, his chronic gait disorder appears relatively stable -His PCP refilled his Valium for muscle spasms and hydrocodone for pain -Discussed the importance of fall prevention -Monitor the mood issues, discuss with PCP -CT head has shown advanced small vessel disease, continue management of vascular risk factors -I would like to see [name] in 6 months to establish care, can discuss if continue to need to be seen at our office, his wife prefers he continue coming for  surveillance  Evangeline Dakin, DNP 10/02/2022, 10:47 AM [facility] [address], [place], [place] [address] [phone]

## 2022-10-03 DIAGNOSIS — N179 Acute kidney failure, unspecified: Secondary | ICD-10-CM | POA: Diagnosis not present

## 2022-10-03 DIAGNOSIS — Z466 Encounter for fitting and adjustment of urinary device: Secondary | ICD-10-CM | POA: Diagnosis not present

## 2022-10-03 DIAGNOSIS — N319 Neuromuscular dysfunction of bladder, unspecified: Secondary | ICD-10-CM | POA: Diagnosis not present

## 2022-10-03 DIAGNOSIS — Z435 Encounter for attention to cystostomy: Secondary | ICD-10-CM | POA: Diagnosis not present

## 2022-10-03 DIAGNOSIS — Z436 Encounter for attention to other artificial openings of urinary tract: Secondary | ICD-10-CM | POA: Diagnosis not present

## 2022-10-03 DIAGNOSIS — N390 Urinary tract infection, site not specified: Secondary | ICD-10-CM | POA: Diagnosis not present

## 2022-10-08 ENCOUNTER — Ambulatory Visit (INDEPENDENT_AMBULATORY_CARE_PROVIDER_SITE_OTHER): Payer: Medicare Other | Admitting: Urology

## 2022-10-08 ENCOUNTER — Telehealth: Payer: Self-pay

## 2022-10-08 DIAGNOSIS — R339 Retention of urine, unspecified: Secondary | ICD-10-CM | POA: Diagnosis not present

## 2022-10-08 NOTE — Progress Notes (Signed)
Suprapubic Cath Change  Patient is present today for a suprapubic catheter change due to urinary retention.  75m of water was drained from the balloon, a 24FR foley cath was removed from the tract with out difficulty.  Site was cleaned and prepped in a sterile fashion with betadine.  A 24FR foley cath was replaced into the tract no complications were noted. Catheter was flushed with 50cc of sterile water with return, 10 ml of sterile water was inflated into the balloon and a leg bag was attached for drainage.  Patient tolerated well. A night bag was given to patient and proper instruction was given on how to switch bags.    Performed by: KLevi Aland CMA  Follow up: Follow up as scheduled.

## 2022-10-08 NOTE — Telephone Encounter (Signed)
Patient is requesting a refill of levaquin to take after having his monthly sp tube changes.  Ok to send in refill with previous sig?  Please advise.

## 2022-10-09 ENCOUNTER — Other Ambulatory Visit: Payer: Self-pay

## 2022-10-09 DIAGNOSIS — N184 Chronic kidney disease, stage 4 (severe): Secondary | ICD-10-CM | POA: Diagnosis not present

## 2022-10-09 DIAGNOSIS — Z435 Encounter for attention to cystostomy: Secondary | ICD-10-CM

## 2022-10-09 MED ORDER — LEVOFLOXACIN 500 MG PO TABS: ORAL_TABLET | ORAL | 1 refills | Status: DC

## 2022-10-09 NOTE — Telephone Encounter (Signed)
Rx sent and patient aware 

## 2022-10-09 NOTE — Progress Notes (Signed)
Levaquin refill sent to pharmacy, verbal ok per MD.

## 2022-10-13 ENCOUNTER — Ambulatory Visit
Admission: EM | Admit: 2022-10-13 | Discharge: 2022-10-13 | Disposition: A | Discharge status: Home / Self Care | Payer: Medicare Other | Attending: Nurse Practitioner | Admitting: Nurse Practitioner

## 2022-10-13 DIAGNOSIS — M5432 Sciatica, left side: Secondary | ICD-10-CM

## 2022-10-13 MED ORDER — TIZANIDINE HCL 4 MG PO TABS: 4.0000 mg | ORAL_TABLET | Freq: Four times a day (QID) | ORAL | 0 refills | Status: DC | PRN

## 2022-10-13 NOTE — ED Triage Notes (Signed)
Pt reports pain in left hip and he is having trouble walking x 3 days. Reports he is not able to stand up today.   Per family, pt was told he is not having a good circulation in the left leg and was told to follow up with his PCP.

## 2022-10-13 NOTE — Discharge Instructions (Addendum)
It sounds like your sciatic nerve is inflamed.  Please try the exercises at home.  You can also take the tizanidine every 6 hours as needed for pain.  Take this separately from the diazepam and hydrocodone.  Go to ER if symptoms worsen.

## 2022-10-13 NOTE — ED Provider Notes (Signed)
[facility]    CSN: [id number] Arrival date & time: 10/13/22  1246      History   Chief Complaint Chief Complaint  Patient presents with   Back Pain    HPI Jordan Ward is a 82 y.o. male.   Patient presents today with wife for 3-day history of left-sided buttock pain that is radiating down to his left knee.  No recent fall, accident, trauma, or injury to the buttock, leg, or knee.  Wife reports pain began when he was standing bent over the table reaching for something and then stood upright.  Pain is severe and patient has been unable to bear weight today.  Wife reports that she had to call an ambulance to help her get him in the car so that she could take him to urgent care today.  Reports EMS told her there was a 10 to 12-hour wait in the emergency room and so she does not want to go there.  Patient takes hydrocodone chronically for pain and this has not helped with the pain in his buttock.  No open wounds to the area.  No known redness, swelling, bruising to the area.    Past Medical History:  Diagnosis Date   Anemia    Arthritis    Back pain, chronic    Bilateral carotid bruits    C. difficile colitis 09/2011   CAD (coronary artery disease)    Carotid artery stenosis    Cerebrovascular disease    Cerebrovascular disease 08/17/2018   Colon polyps    Complication of cystostomy catheter, initial encounter ([place]) 04/20/2020   Dyslipidemia    Dysphagia 10/07/2011   Encephalopathy    Gait disorder    HA (headache)    High grade dysplasia in colonic adenoma 09/2005   History of kidney stones    HTN (hypertension), malignant 10/06/2011   Hypernatremia    Hypokalemia    Hypothyroidism 10/08/2011   Insomnia    Junctional rhythm    Kidney stones    MS (multiple sclerosis) (HCC)    Nausea and vomiting 03/02/2020   Neuromuscular disorder (HCC)    MS   OSA (obstructive sleep apnea)    Paroxysmal atrial tachycardia    Peripheral vascular disease ([place])     Pneumonia 4 yrs ago   Pulmonary fibrosis ([place]) 10/06/2011   Pulmonary nodule 10/08/2011   PVD (peripheral vascular disease) ([place])    Sacral ulcer (HCC)    Sleep apnea    cannot tolerate   Stroke ([place])    left sided weakness   Suprapubic catheter ([place])    TIA (transient ischemic attack)    Tremors of nervous system 10/08/2011   Urinary tract infection    Ventral hernia without obstruction or gangrene     Large 8X9cm ventral hernia with loss of domain. CT reads report as diastasis recti with herniation or diastasis recti.  Dr. Constance Haw, Surgery, reviewed CT with radiology and there is herniation with only hernia sac or peritoneum over the bowel and large separation of the rectus muslce (i.e. diastasis recti aka loss of domain).  No surgical intervention recommended given size, age, and health.     Patient Active Problem List   Diagnosis Date Noted   Hypokalemia 05/19/2022   Pressure ulcer, heel 07/14/2021   Rigors 03/07/2021   Hydronephrosis, left 02/13/2021   Encounter for removal of vascular catheter 09/14/2020   Acute respiratory failure with hypoxia ([place]) 07/12/2020   Pneumonia due to COVID-19 virus 07/12/2020  Encephalopathy due to COVID-19 virus 07/12/2020   Acute hypoxemic respiratory failure due to COVID-19 [facility]) 07/03/2020   Calculus of ureter 05/15/2020   Bacteremia due to Gram-negative bacteria 05/04/2020   Acute lower UTI 05/04/2020   Acute unilateral obstructive uropathy    SIRS (systemic inflammatory response syndrome) ([place]) 05/03/2020   Hydronephrosis 35/57/3220   Complication of cystostomy catheter, subsequent encounter 04/20/2020   Acute kidney injury superimposed on CKD ([place]) 03/02/2020   Dehydration 03/02/2020   Nausea and vomiting 03/02/2020   Ventral hernia without obstruction or gangrene 03/01/2020   Hypotension 03/01/2020   Poor appetite 03/01/2020   Urinary tract infection 03/01/2020   Detrusor areflexia 09/16/2019   Cerebrovascular disease 08/17/2018    Partial small bowel obstruction ([place]) 07/11/2017   Choledocholithiasis 25/42/7062   Complicated UTI (urinary tract infection)    Polymicrobial septicemia ([place]) 06/14/2017   Paroxysmal atrial tachycardia 06/14/2017   Hypophosphatemia 06/14/2017   Pressure injury of skin 05/26/2017   Hydronephrosis due to obstruction of ureter 05/26/2017   Obstructive uropathy 05/25/2017   CKD (chronic kidney disease), stage III ([place]) 08/23/2016   Hydronephrosis of left kidney 08/23/2016   Kidney stones 09/13/2015   Essential hypertension    Pressure ulcer 08/04/2015   Chronic diastolic (congestive) heart failure ([place]) 08/04/2015   Hydronephrosis with obstructing calculus 08/04/2015   Occult blood positive stool 09/05/2014   Rash and nonspecific skin eruption 08/25/2014   Edema 08/19/2014   Subacute confusional state 08/06/2014   Dilated cardiomyopathy ([place]) 07/21/2014   Sepsis ([place]) 07/17/2014   Severe sepsis ([place]) 07/17/2014   Headache 03/13/2014   CVA (cerebral infarction) 03/13/2014   Abnormality of gait 03/13/2014   Tremors of nervous system 10/08/2011   Hypothyroidism 10/08/2011   Pulmonary nodule 10/08/2011   Dysphagia 10/07/2011   HTN (hypertension), malignant 10/06/2011   Chronic suprapubic catheter ([place]) 10/06/2011   Pulmonary fibrosis ([place]) 10/06/2011   UTI (urinary tract infection) 10/03/2011   CAP (community acquired pneumonia) 10/02/2011   Junctional rhythm 10/02/2011   Multiple sclerosis ([place]) 10/02/2011   Chronic diarrhea 06/17/2011   Hx of adenomatous colonic polyps 06/17/2011   High grade dysplasia in colonic adenoma 09/29/2005    Past Surgical History:  Procedure Laterality Date   APPENDECTOMY  09/2005   at time of left hemicolectomy   BACK SURGERY  1976/1979   lower   BILIARY DILATION N/A 03/03/2020   Procedure: BILIARY DILATION;  Surgeon: Rogene Houston, MD;  [facility];  Service: Endoscopy;  Laterality: N/A;   CATARACT EXTRACTION W/PHACO Right  03/08/2018   Procedure: CATARACT EXTRACTION PHACO AND INTRAOCULAR LENS PLACEMENT RIGHT EYE;  Surgeon: Tonny Branch, MD;  Location: [facility];  Service: Ophthalmology;  Laterality: Right;  CDE: 8.86   CATARACT EXTRACTION W/PHACO Left 04/05/2018   Procedure: CATARACT EXTRACTION PHACO AND INTRAOCULAR LENS PLACEMENT (IOC);  Surgeon: Tonny Branch, MD;  Location: [facility];  Service: Ophthalmology;  Laterality: Left;  CDE: 7.36   CENTRAL LINE INSERTION-TUNNELED Right 09/11/2020   Procedure: PLACEMENT OF TUNNELED CENTRAL LINE INTO JUGULAR VEIN;  Surgeon: Virl Cagey, MD;  Location: [facility];  Service: General;  Laterality: Right;   CHOLECYSTECTOMY     Dr. Tamala Julian   COLON SURGERY  09/2005   Fleishman: four tubular adenomas, large adenomatous polyp with HIGH GRADE dysplasia   COLONOSCOPY  11/2004   Dr. Sharol Roussel sessile polyp splenic flexure, 21m sessile polyp desc colon, tubulovillous adenoma (bx not removed)   COLONOSCOPY  01/2005   poor prep, polyp could not  be found   COLONOSCOPY  05/2005   with EMR, polypectomy Dr. Olegario Messier, bx showed high grade dysplasia, partially resected   COLONOSCOPY  09/2005   Dr. Arsenio Loader, [place] ink tattooing, four villous colon polyp (3 had been missed on previous colonoscopies due to limitations of procedures   COLONOSCOPY  09/2006   normal TI, no polyps   COLONOSCOPY  10/2007   Dr. Imogene Burn distal mammillations, benign bx, normal TI, random bx neg for microscopic colitis   CYSTOSCOPY W/ URETERAL STENT PLACEMENT Left 09/05/2020   Procedure: CYSTOSCOPY WITH RETROGRADE PYELOGRAM/URETERAL STENT PLACEMENT;  Surgeon: Ardis Hughs, MD;  Location: [facility];  Service: Urology;  Laterality: Left;   CYSTOSCOPY WITH LITHOLAPAXY N/A 07/27/2018   Procedure: CYSTOSCOPY WITH LITHOLAPAXY VIA  SUPRAPUBIC TUBE;  Surgeon: Franchot Gallo, MD;  Location: [facility];  Service: Urology;  Laterality: N/A;   CYSTOSCOPY WITH RETROGRADE PYELOGRAM, URETEROSCOPY AND STENT PLACEMENT Left  06/09/2017   Procedure: CYSTOSCOPY WITH LEFT RETROGRADE PYELOGRAM, LEFT URETEROSCOPY, LEFT URETEROSCOPIC STONE EXTRACTION, LEFT URETERAL STENT PLACEMENT;  Surgeon: Franchot Gallo, MD;  Location: [facility];  Service: Urology;  Laterality: Left;   CYSTOSCOPY/URETEROSCOPY/HOLMIUM LASER/STENT PLACEMENT Left 05/22/2020   Procedure: CYSTOSCOPY/URETEROSCOPY/STENT PLACEMENT;  Surgeon: Franchot Gallo, MD;  Location: [facility];  Service: Urology;  Laterality: Left;   ERCP N/A 03/03/2020   Procedure: ENDOSCOPIC RETROGRADE CHOLANGIOPANCREATOGRAPHY (ERCP);  Surgeon: Rogene Houston, MD;  [facility];  Service: Endoscopy;  Laterality: N/A;   ERCP N/A 04/20/2020   Procedure: ENDOSCOPIC RETROGRADE CHOLANGIOPANCREATOGRAPHY (ERCP);  Surgeon: Rogene Houston, MD;  [facility];  Service: Endoscopy;  Laterality: N/A;  to be done at 7:30am in [place] N/A 04/20/2020   Procedure: STENT REMOVAL;  Surgeon: Rogene Houston, MD;  [facility];  Service: Endoscopy;  Laterality: N/A;   INGUINAL HERNIA REPAIR  1971   bilateral   INSERTION OF SUPRAPUBIC CATHETER  06/09/2017   Procedure: EXCHANGE OF SUPRAPUBIC CATHETER;  Surgeon: Franchot Gallo, MD;  Location: [facility];  Service: Urology;;   INSERTION OF SUPRAPUBIC CATHETER  05/22/2020   Procedure: SUPRAPUBIC CATHETER EXCHANGE;  Surgeon: Franchot Gallo, MD;  Location: [facility];  Service: Urology;;   IR NEPHROSTOMY EXCHANGE LEFT  03/25/2021   IR NEPHROSTOMY EXCHANGE LEFT  05/20/2021   IR NEPHROSTOMY EXCHANGE LEFT  07/13/2021   IR NEPHROSTOMY EXCHANGE LEFT  09/09/2021   IR NEPHROSTOMY EXCHANGE LEFT  11/04/2021   IR NEPHROSTOMY EXCHANGE LEFT  12/23/2021   IR NEPHROSTOMY EXCHANGE LEFT  02/17/2022   IR NEPHROSTOMY EXCHANGE LEFT  04/14/2022   IR NEPHROSTOMY EXCHANGE LEFT  06/09/2022   IR NEPHROSTOMY EXCHANGE LEFT  07/28/2022   IR NEPHROSTOMY EXCHANGE LEFT  09/09/2022   IR NEPHROSTOMY PLACEMENT LEFT  05/26/2017   IR  NEPHROSTOMY PLACEMENT LEFT  05/02/2020   IR NEPHROSTOMY PLACEMENT LEFT  02/15/2021   IR URETERAL STENT PERC REMOVAL MOD SED  03/25/2021   KIDNEY STONE SURGERY  09/13/2015   LITHOTRIPSY N/A 03/03/2020   Procedure: MECHANICAL LITHOTRIPSY WITH REMOVAL OF MULTIPLE STONE FRAGMENTS;  Surgeon: Rogene Houston, MD;  [facility];  Service: Endoscopy;  Laterality: N/A;   NEPHROLITHOTOMY Left 09/13/2015   Procedure: LEFT PERCUTANEOUS NEPHROLITHOTOMY ;  Surgeon: Franchot Gallo, MD;  Location: [facility];  Service: Urology;  Laterality: Left;   NEPHROSTOMY TUBE REMOVAL  05/22/2020   Procedure: NEPHROSTOMY TUBE REMOVAL;  Surgeon: Franchot Gallo, MD;  Location: [facility];  Service: Urology;;   [facility] REMOVAL Right  09/20/2020   Procedure: MINOR REMOVAL CENTRAL LINE;  Surgeon: Virl Cagey, MD;  Location: [facility];  Service: General;  Laterality: Right;  Pt to arrive at 7:30am for procedure   REMOVAL OF STONES N/A 04/20/2020   Procedure: REMOVAL OF STONES;  Surgeon: Rogene Houston, MD;  [facility];  Service: Endoscopy;  Laterality: N/A;   SPHINCTEROTOMY  03/03/2020   Procedure: BILLARY SPHINCTEROTOMY;  Surgeon: Rogene Houston, MD;  [facility];  Service: Endoscopy;;   SUPRAPUBIC CATHETER INSERTION         Home Medications    Prior to Admission medications   Medication Sig Start Date End Date Taking? Authorizing Provider  tiZANidine (ZANAFLEX) 4 MG tablet Take 1 tablet (4 mg total) by mouth every 6 (six) hours as needed for muscle spasms. Do not take with alcohol or while driving or operating heavy machinery.  May cause drowsiness. 10/13/22  Yes Eulogio Bear, NP  acetaminophen (TYLENOL) 325 MG tablet Take 650 mg by mouth every 6 (six) hours as needed for mild pain, moderate pain, fever or headache.    [provider]  amLODipine (NORVASC) 10 MG tablet Take 5 mg by mouth every evening. After supper. 05/21/21   [provider]   Carboxymethylcellulose Sodium (DRY EYE RELIEF OP) Place 1 drop into both eyes 4 (four) times daily as needed (dry eyes).    [provider]  diazepam (VALIUM) 5 MG tablet Take 1 tablet (5 mg total) by mouth 2 (two) times daily. 08/28/22   Aline August, MD  HYDROcodone-acetaminophen (NORCO) 7.5-325 MG tablet Take 1 tablet by mouth 2 (two) times daily. Max APAP 3 GM IN 24 HOURS FROM ALL SOURCES 08/28/22   Aline August, MD  levofloxacin (LEVAQUIN) 500 MG tablet Take 1 as directed 10/09/22   Franchot Gallo, MD  levothyroxine (SYNTHROID) 75 MCG tablet Take 75 mcg by mouth in the morning. 07/02/21   [provider]  loperamide (IMODIUM A-D) 2 MG tablet Take 1 tablet (2 mg total) by mouth 4 (four) times daily as needed for diarrhea or loose stools. 05/22/22   Manuella Ghazi, Pratik D, DO  OXYGEN Inhale 1 L into the lungs every evening.    [provider]  potassium chloride SA (KLOR-CON) 20 MEQ tablet Take 20 mEq by mouth every evening. After supper 07/02/21   [provider]    Family History Family History  Problem Relation Age of Onset   Cirrhosis Brother        etoh   Stroke Mother 20   Coronary artery disease Father 4   Heart attack Brother    Cancer Sister    Multiple sclerosis Other    Colon cancer Neg Hx     Social History Social History   Tobacco Use   Smoking status: Former    Packs/day: 1.00    Years: 25.00    Total pack years: 25.00    Types: Cigarettes    Quit date: 03/03/1988    Years since quitting: 34.6   Smokeless tobacco: Never  Vaping Use   Vaping Use: Never used  Substance Use Topics   Alcohol use: No   Drug use: No     Allergies   Tetracyclines & related and Cipro [ciprofloxacin hcl]   Review of Systems Review of Systems Per HPI  Physical Exam Triage Vital Signs ED Triage Vitals  Enc Vitals Group     BP 10/13/22 1338 (!) 152/79     Pulse Rate  10/13/22 1338 71     Resp 10/13/22 1338 18     Temp 10/13/22 1338 97.8 F  (36.6 C)     Temp Source 10/13/22 1338 Oral     SpO2 10/13/22 1338 94 %     Weight --      Height --      Head Circumference --      Peak Flow --      Pain Score 10/13/22 1341 10     Pain Loc --      Pain Edu? --      Excl. in [place]? --    No data found.  Updated Vital Signs BP (!) 152/79 (BP Location: Right Arm)   Pulse 71   Temp 97.8 F (36.6 C) (Oral)   Resp 18   SpO2 94%   Visual Acuity Right Eye Distance:   Left Eye Distance:   Bilateral Distance:    Right Eye Near:   Left Eye Near:    Bilateral Near:     Physical Exam Vitals and nursing note reviewed.  Constitutional:      General: He is not in acute distress.    Appearance: Normal appearance. He is not toxic-appearing.  Pulmonary:     Effort: Pulmonary effort is normal. No respiratory distress.  Musculoskeletal:     Right lower leg: No edema.     Left lower leg: No edema.       Legs:     Comments: Inspection: Somewhat difficult to inspect the entire area and pain secondary to patient being wheelchair-bound today; no obvious swelling, deformity, or redness Palpation: Left buttock tender to palpation in approximately area marked; pain as depicted above; no obvious deformities palpated ROM: Unable to perform today secondary to pain Strength: unable to perform today secondary to pain Neurovascular: Capillary refill less than 3 seconds left lower extremity  Skin:    General: Skin is warm and dry.     Capillary Refill: Capillary refill takes less than 2 seconds.     Coloration: Skin is not jaundiced or pale.     Findings: No erythema.  Neurological:     Mental Status: He is alert and oriented to person, place, and time.  Psychiatric:        Behavior: Behavior is cooperative.      UC Treatments / Results  Labs (all labs ordered are listed, but only abnormal results are displayed) Labs Reviewed - No data to display  EKG   Radiology No results found.  Procedures Procedures (including critical care  time)  Medications Ordered in UC Medications - No data to display  Initial Impression / Assessment and Plan / UC Course  I have reviewed the triage vital signs and the nursing notes.  Pertinent labs & imaging results that were available during my care of the patient were reviewed by me and considered in my medical decision making (see chart for details).   Patient is well-appearing, normotensive, afebrile, not tachycardic, not tachypneic, oxygenating well on room air.    Left sciatic nerve pain Pain sound sciatic in nature No red flags in history or on exam; x-ray imaging deferred given patient is unable to stand in urgent care appropriately Recommended seeking care in emergency room if they desire x-rays-wife and patient declined at this time Start sciatic rehab-handout given Can also use tizanidine every 6 hours as needed for pain; instructed to separate this from chronic diazepam/hydrocodone by least 1 hour Recommended seeking care in ER if symptoms worsen  The patient was given the opportunity to ask questions.  All questions answered to their satisfaction.  The patient is in agreement to this plan.    Final Clinical Impressions(s) / UC Diagnoses   Final diagnoses:  Left sciatic nerve pain     Discharge Instructions      It sounds like your sciatic nerve is inflamed.  Please try the exercises at home.  You can also take the tizanidine every 6 hours as needed for pain.  Take this separately from the diazepam and hydrocodone.  Go to ER if symptoms worsen.    ED Prescriptions     Medication Sig Dispense Auth. Provider   tiZANidine (ZANAFLEX) 4 MG tablet Take 1 tablet (4 mg total) by mouth every 6 (six) hours as needed for muscle spasms. Do not take with alcohol or while driving or operating heavy machinery.  May cause drowsiness. 30 tablet Eulogio Bear, NP      PDMP not reviewed this encounter.   Eulogio Bear, NP 10/13/22 1436

## 2022-10-15 ENCOUNTER — Ambulatory Visit (HOSPITAL_COMMUNITY)
Admission: RE | Admit: 2022-10-15 | Discharge: 2022-10-15 | Disposition: A | Discharge status: Home / Self Care | Payer: Medicare Other | Source: Ambulatory Visit | Attending: Family Medicine | Admitting: Family Medicine

## 2022-10-15 ENCOUNTER — Other Ambulatory Visit (HOSPITAL_COMMUNITY): Payer: Self-pay | Admitting: Family Medicine

## 2022-10-15 DIAGNOSIS — M79605 Pain in left leg: Secondary | ICD-10-CM | POA: Insufficient documentation

## 2022-10-15 DIAGNOSIS — N184 Chronic kidney disease, stage 4 (severe): Secondary | ICD-10-CM | POA: Diagnosis not present

## 2022-10-15 DIAGNOSIS — I10 Essential (primary) hypertension: Secondary | ICD-10-CM | POA: Diagnosis not present

## 2022-10-15 DIAGNOSIS — M25552 Pain in left hip: Secondary | ICD-10-CM | POA: Diagnosis not present

## 2022-10-15 DIAGNOSIS — I129 Hypertensive chronic kidney disease with stage 1 through stage 4 chronic kidney disease, or unspecified chronic kidney disease: Secondary | ICD-10-CM | POA: Diagnosis not present

## 2022-10-15 DIAGNOSIS — G35 Multiple sclerosis: Secondary | ICD-10-CM | POA: Diagnosis not present

## 2022-10-15 DIAGNOSIS — I739 Peripheral vascular disease, unspecified: Secondary | ICD-10-CM | POA: Diagnosis not present

## 2022-10-15 DIAGNOSIS — Z960 Presence of urogenital implants: Secondary | ICD-10-CM | POA: Diagnosis not present

## 2022-10-15 DIAGNOSIS — N319 Neuromuscular dysfunction of bladder, unspecified: Secondary | ICD-10-CM | POA: Diagnosis not present

## 2022-10-15 DIAGNOSIS — R262 Difficulty in walking, not elsewhere classified: Secondary | ICD-10-CM | POA: Diagnosis not present

## 2022-11-03 ENCOUNTER — Ambulatory Visit (INDEPENDENT_AMBULATORY_CARE_PROVIDER_SITE_OTHER): Payer: Medicare Other | Admitting: Urology

## 2022-11-03 DIAGNOSIS — R339 Retention of urine, unspecified: Secondary | ICD-10-CM | POA: Diagnosis not present

## 2022-11-03 NOTE — Progress Notes (Signed)
Suprapubic Cath Change  Patient is present today for a suprapubic catheter change due to urinary retention.  83m of water was drained from the balloon, a 24FR foley cath was removed from the tract with out difficulty.  Site was cleaned and prepped in a sterile fashion with betadine.  A 24FR foley cath was replaced into the tract and flushed with 649mof sterile water no complications were noted. Urine return was noted, 10 ml of sterile water was inflated into the balloon and a leg bag was attached for drainage.  Patient tolerated well. A night bag was given to patient and proper instruction was given on how to switch bags.    Performed by: ShMarisue BrooklynCMA  Follow up: Keep NV

## 2022-11-04 ENCOUNTER — Other Ambulatory Visit (HOSPITAL_COMMUNITY): Payer: Self-pay | Admitting: Interventional Radiology

## 2022-11-04 ENCOUNTER — Ambulatory Visit (HOSPITAL_COMMUNITY)
Admission: RE | Admit: 2022-11-04 | Discharge: 2022-11-04 | Disposition: A | Discharge status: Home / Self Care | Payer: Medicare Other | Source: Ambulatory Visit | Attending: Interventional Radiology | Admitting: Interventional Radiology

## 2022-11-04 ENCOUNTER — Ambulatory Visit: Payer: Medicare Other

## 2022-11-04 DIAGNOSIS — Z466 Encounter for fitting and adjustment of urinary device: Secondary | ICD-10-CM | POA: Diagnosis not present

## 2022-11-04 DIAGNOSIS — N319 Neuromuscular dysfunction of bladder, unspecified: Secondary | ICD-10-CM | POA: Insufficient documentation

## 2022-11-04 HISTORY — PX: IR NEPHROSTOMY EXCHANGE LEFT: IMG6069

## 2022-11-04 MED ORDER — IOHEXOL 300 MG/ML  SOLN
50.0000 mL | Freq: Once | INTRAMUSCULAR | Status: AC | PRN
  Administered 2022-11-04: 10 mL

## 2022-11-04 MED ORDER — LIDOCAINE HCL 1 % IJ SOLN
INTRAMUSCULAR | Status: AC
  Filled 2022-11-04: qty 20

## 2022-11-04 NOTE — Procedures (Signed)
Interventional Radiology Procedure:   Indications: Chronic left nephrostomy tube   Procedure: Nephrostomy tube exchange  Findings: New 12 Fr nephrostomy in renal pelvis  Complications: No immediate complications noted.     EBL: Minimal  Plan: Continue with routine exchanges   Jordan Clabo R. Anselm Pancoast, MD  Pager: [phone]

## 2022-11-05 DIAGNOSIS — M11262 Other chondrocalcinosis, left knee: Secondary | ICD-10-CM | POA: Diagnosis not present

## 2022-11-05 DIAGNOSIS — M1120 Other chondrocalcinosis, unspecified site: Secondary | ICD-10-CM | POA: Diagnosis not present

## 2022-11-25 ENCOUNTER — Ambulatory Visit: Payer: Medicare Other | Admitting: Diagnostic Neuroimaging

## 2022-12-01 DIAGNOSIS — G459 Transient cerebral ischemic attack, unspecified: Secondary | ICD-10-CM | POA: Diagnosis not present

## 2022-12-01 DIAGNOSIS — D631 Anemia in chronic kidney disease: Secondary | ICD-10-CM | POA: Diagnosis not present

## 2022-12-01 DIAGNOSIS — N261 Atrophy of kidney (terminal): Secondary | ICD-10-CM | POA: Diagnosis not present

## 2022-12-01 DIAGNOSIS — G894 Chronic pain syndrome: Secondary | ICD-10-CM | POA: Diagnosis not present

## 2022-12-01 DIAGNOSIS — G35 Multiple sclerosis: Secondary | ICD-10-CM | POA: Diagnosis not present

## 2022-12-01 DIAGNOSIS — I129 Hypertensive chronic kidney disease with stage 1 through stage 4 chronic kidney disease, or unspecified chronic kidney disease: Secondary | ICD-10-CM | POA: Diagnosis not present

## 2022-12-01 DIAGNOSIS — I2510 Atherosclerotic heart disease of native coronary artery without angina pectoris: Secondary | ICD-10-CM | POA: Diagnosis not present

## 2022-12-01 DIAGNOSIS — N184 Chronic kidney disease, stage 4 (severe): Secondary | ICD-10-CM | POA: Diagnosis not present

## 2022-12-01 DIAGNOSIS — R7303 Prediabetes: Secondary | ICD-10-CM | POA: Diagnosis not present

## 2022-12-01 DIAGNOSIS — N179 Acute kidney failure, unspecified: Secondary | ICD-10-CM | POA: Diagnosis not present

## 2022-12-01 DIAGNOSIS — N139 Obstructive and reflux uropathy, unspecified: Secondary | ICD-10-CM | POA: Diagnosis not present

## 2022-12-08 ENCOUNTER — Ambulatory Visit: Payer: Medicare Other

## 2022-12-15 ENCOUNTER — Ambulatory Visit (INDEPENDENT_AMBULATORY_CARE_PROVIDER_SITE_OTHER): Payer: Medicare Other | Admitting: Urology

## 2022-12-15 DIAGNOSIS — R339 Retention of urine, unspecified: Secondary | ICD-10-CM | POA: Diagnosis not present

## 2022-12-15 NOTE — Progress Notes (Addendum)
Suprapubic Cath Change  Patient is present today for a suprapubic catheter change due to urinary retention.  43ml of water was drained from the balloon, a 24FR foley cath was removed from the tract with out difficulty.  Site was cleaned and prepped in a sterile fashion with betadine.  A 24FR foley cath was replaced into the tract no complications were noted. Urine return was noted, 10 ml of sterile water was inflated into the balloon and a leg bag was attached for drainage.  Patient tolerated well.   Performed by: Marissah Vandemark LPN  Follow up: keep scheduled NV

## 2022-12-25 DIAGNOSIS — S91001A Unspecified open wound, right ankle, initial encounter: Secondary | ICD-10-CM | POA: Diagnosis not present

## 2022-12-25 DIAGNOSIS — X58XXXA Exposure to other specified factors, initial encounter: Secondary | ICD-10-CM | POA: Diagnosis not present

## 2022-12-25 DIAGNOSIS — S81801A Unspecified open wound, right lower leg, initial encounter: Secondary | ICD-10-CM | POA: Diagnosis not present

## 2022-12-26 ENCOUNTER — Encounter: Payer: Medicare Other | Admitting: *Deleted

## 2022-12-30 ENCOUNTER — Other Ambulatory Visit (HOSPITAL_COMMUNITY): Payer: Self-pay | Admitting: Interventional Radiology

## 2022-12-30 ENCOUNTER — Ambulatory Visit (HOSPITAL_COMMUNITY)
Admission: RE | Admit: 2022-12-30 | Discharge: 2022-12-30 | Disposition: A | Discharge status: Home / Self Care | Payer: Medicare Other | Source: Ambulatory Visit | Attending: Interventional Radiology | Admitting: Interventional Radiology

## 2022-12-30 ENCOUNTER — Other Ambulatory Visit (HOSPITAL_COMMUNITY): Payer: Medicare Other

## 2022-12-30 DIAGNOSIS — N319 Neuromuscular dysfunction of bladder, unspecified: Secondary | ICD-10-CM

## 2022-12-30 DIAGNOSIS — Z436 Encounter for attention to other artificial openings of urinary tract: Secondary | ICD-10-CM | POA: Diagnosis not present

## 2022-12-30 DIAGNOSIS — Z466 Encounter for fitting and adjustment of urinary device: Secondary | ICD-10-CM | POA: Diagnosis not present

## 2022-12-30 HISTORY — PX: IR NEPHROSTOMY EXCHANGE LEFT: IMG6069

## 2022-12-30 MED ORDER — LIDOCAINE HCL 1 % IJ SOLN
INTRAMUSCULAR | Status: AC
  Filled 2022-12-30: qty 20

## 2022-12-30 MED ORDER — IOHEXOL 300 MG/ML  SOLN
50.0000 mL | Freq: Once | INTRAMUSCULAR | Status: AC | PRN
  Administered 2022-12-30: 10 mL

## 2022-12-30 NOTE — Procedures (Signed)
Vascular and Interventional Radiology Procedure Note  Patient: Jordan Ward DOB: Oct 17, 1940 Medical Record Number: [id number] Note Date/Time: 12/30/22 3:33 PM   Performing Physician: Michaelle Birks, MD Assistant(s): None  Diagnosis: Routine exchange. and Chronic indwelling tube.  Procedure:  LEFT NEPHROSTOMY TUBE EXCHANGE  Anesthesia: Local Anesthetic Complications: None Estimated Blood Loss:  0 mL Specimens:  None  Findings:  Successful exchange for a left-sided, 12 F nephrostomy tube into the left kidney(s).   Plan: Resume previous  care. Follow up for routine nephrostomy tube exchange in 8 week(s).   See detailed procedure note with images in PACS. The patient tolerated the procedure well without incident or complication and was returned to Recovery in stable condition.    Michaelle Birks, MD Vascular [facility] Interventional Radiology Special[facility]   Pager. [place]

## 2022-12-31 ENCOUNTER — Other Ambulatory Visit (HOSPITAL_COMMUNITY): Payer: Self-pay | Admitting: Family Medicine

## 2022-12-31 ENCOUNTER — Ambulatory Visit (HOSPITAL_COMMUNITY)
Admission: RE | Admit: 2022-12-31 | Discharge: 2022-12-31 | Disposition: A | Discharge status: Home / Self Care | Payer: Medicare Other | Source: Ambulatory Visit | Attending: Family Medicine | Admitting: Family Medicine

## 2022-12-31 DIAGNOSIS — S81801A Unspecified open wound, right lower leg, initial encounter: Secondary | ICD-10-CM | POA: Diagnosis not present

## 2022-12-31 DIAGNOSIS — S91001D Unspecified open wound, right ankle, subsequent encounter: Secondary | ICD-10-CM | POA: Diagnosis not present

## 2022-12-31 DIAGNOSIS — X58XXXD Exposure to other specified factors, subsequent encounter: Secondary | ICD-10-CM | POA: Diagnosis not present

## 2022-12-31 DIAGNOSIS — S91001A Unspecified open wound, right ankle, initial encounter: Secondary | ICD-10-CM | POA: Diagnosis not present

## 2023-01-01 ENCOUNTER — Ambulatory Visit: Payer: Self-pay | Admitting: *Deleted

## 2023-01-01 DIAGNOSIS — M79675 Pain in left toe(s): Secondary | ICD-10-CM | POA: Diagnosis not present

## 2023-01-01 DIAGNOSIS — B351 Tinea unguium: Secondary | ICD-10-CM | POA: Diagnosis not present

## 2023-01-01 DIAGNOSIS — M79674 Pain in right toe(s): Secondary | ICD-10-CM | POA: Diagnosis not present

## 2023-01-01 NOTE — Patient Outreach (Signed)
  Care Coordination   Follow Up Visit Note   05/07/2023 late entry for 01/01/23 Name: Jordan Ward MRN: [id number] DOB: 08/17/1941  Jordan Ward is a 82 y.o. year old male who sees Margo Aye, Kathleene Hazel, MD for primary care. I spoke with  Berna Spare by phone today.  What matters to the patients health and wellness today?  Still need wheel chair  [name] has not visited in 2024  Saw pcp for scab on ankle after he hit his ankle on a door while going to the bathroom. The site was very red  Use gripper  Discuss crocs Left foot has fungus on the toe nail   Suprapubic intact - followed by urology  He received shots during his stay in [facility] [place] skilled nursing facility (snf)    Goals Addressed               This Visit's Progress     Patient Stated     improve mobility [facility]) (pt-stated)        Interventions Today    Flowsheet Row Most Recent Value  Chronic Disease   Chronic disease during today's visit Other, Hypertension (HTN)  [[facility] RN CM services, left foot fungus, vaccines suprapubic catheter]  General Interventions   General Interventions Discussed/Reviewed General Interventions Discussed, Programmer, applications, Doctor Visits, Annual Foot Exam, Vaccines  Vaccines --  [inquired about vaccines Confirmed with him that he had vaccines while in skilled nursing]  Doctor Visits Discussed/Reviewed Doctor Visits Discussed, PCP, Specialist  PCP/Specialist Visits Compliance with follow-up visit  Exercise Interventions   Exercise Discussed/Reviewed Exercise Discussed, Assistive device use and maintanence  Education Interventions   Education Provided Provided Education  Provided Verbal Education On Foot Care, Community Resources  [assessed ankle scab, Discussed [facility] RN CM visits]  Mental Health Interventions   Mental Health Discussed/Reviewed Mental Health Reviewed, Coping Strategies  Pharmacy Interventions   Pharmacy Dicussed/Reviewed Pharmacy Topics Discussed,  Affording Medications  Safety Interventions   Safety Discussed/Reviewed Safety Discussed, Home Safety, Fall Risk  Home Safety Assistive Devices              SDOH assessments and interventions completed:  No    Care Coordination Interventions:  Yes, provided   Follow up plan: Follow up call scheduled for 05/08/23    Encounter Outcome:  Pt. Visit Completed    L. Noelle Penner, RN, BSN, CCM [facility] Care Management Community Coordinator Office number [phone]

## 2023-01-06 ENCOUNTER — Ambulatory Visit: Payer: Medicare Other

## 2023-01-06 ENCOUNTER — Telehealth: Payer: Self-pay

## 2023-01-06 NOTE — Telephone Encounter (Signed)
Patient's wife called in today and advised that she is concerned the patient has a kidney infection. Patient is aware that she can bring patient in tomorrow for a nurse visit for a catheter change and clean catch urine specimen. Patient/wife is aware that if the patient gets worst to take patient to the ED for evaluation. Patient/wife voiced understanding

## 2023-01-06 NOTE — Telephone Encounter (Signed)
Patient's wife called advising that she is concerned that patient has a kidney infection. She advised he has went septic in the past and wants to ensure that does not happen again. She would like a phone call to discuss that the next steps should be.

## 2023-01-07 ENCOUNTER — Ambulatory Visit: Payer: Medicare Other

## 2023-01-07 ENCOUNTER — Emergency Department (HOSPITAL_COMMUNITY)
Admission: EM | Admit: 2023-01-07 | Discharge: 2023-01-07 | Disposition: A | Discharge status: Home / Self Care | Payer: Medicare Other | Attending: Emergency Medicine | Admitting: Emergency Medicine

## 2023-01-07 ENCOUNTER — Other Ambulatory Visit: Payer: Self-pay

## 2023-01-07 ENCOUNTER — Encounter (HOSPITAL_COMMUNITY): Payer: Self-pay | Admitting: Emergency Medicine

## 2023-01-07 ENCOUNTER — Emergency Department (HOSPITAL_COMMUNITY): Payer: Medicare Other

## 2023-01-07 DIAGNOSIS — Z79899 Other long term (current) drug therapy: Secondary | ICD-10-CM | POA: Diagnosis not present

## 2023-01-07 DIAGNOSIS — L89619 Pressure ulcer of right heel, unspecified stage: Secondary | ICD-10-CM | POA: Diagnosis not present

## 2023-01-07 DIAGNOSIS — R944 Abnormal results of kidney function studies: Secondary | ICD-10-CM | POA: Diagnosis not present

## 2023-01-07 DIAGNOSIS — N189 Chronic kidney disease, unspecified: Secondary | ICD-10-CM | POA: Insufficient documentation

## 2023-01-07 DIAGNOSIS — M19071 Primary osteoarthritis, right ankle and foot: Secondary | ICD-10-CM | POA: Diagnosis not present

## 2023-01-07 DIAGNOSIS — I959 Hypotension, unspecified: Secondary | ICD-10-CM | POA: Diagnosis not present

## 2023-01-07 DIAGNOSIS — L03115 Cellulitis of right lower limb: Secondary | ICD-10-CM | POA: Diagnosis not present

## 2023-01-07 DIAGNOSIS — E860 Dehydration: Secondary | ICD-10-CM | POA: Diagnosis not present

## 2023-01-07 DIAGNOSIS — M25571 Pain in right ankle and joints of right foot: Secondary | ICD-10-CM | POA: Diagnosis present

## 2023-01-07 DIAGNOSIS — R5381 Other malaise: Secondary | ICD-10-CM | POA: Diagnosis not present

## 2023-01-07 DIAGNOSIS — E876 Hypokalemia: Secondary | ICD-10-CM | POA: Diagnosis not present

## 2023-01-07 LAB — CBC WITH DIFFERENTIAL/PLATELET
Abs Immature Granulocytes: 0.02 10*3/uL (ref 0.00–0.07)
Basophils Absolute: 0 10*3/uL (ref 0.0–0.1)
Basophils Relative: 1 %
Eosinophils Absolute: 0.2 10*3/uL (ref 0.0–0.5)
Eosinophils Relative: 3 %
HCT: 40.7 % (ref 39.0–52.0)
Hemoglobin: 12.8 g/dL — ABNORMAL LOW (ref 13.0–17.0)
Immature Granulocytes: 0 %
Lymphocytes Relative: 29 %
Lymphs Abs: 2 10*3/uL (ref 0.7–4.0)
MCH: 30 pg (ref 26.0–34.0)
MCHC: 31.4 g/dL (ref 30.0–36.0)
MCV: 95.3 fL (ref 80.0–100.0)
Monocytes Absolute: 0.4 10*3/uL (ref 0.1–1.0)
Monocytes Relative: 6 %
Neutro Abs: 4.3 10*3/uL (ref 1.7–7.7)
Neutrophils Relative %: 61 %
Platelets: 198 10*3/uL (ref 150–400)
RBC: 4.27 MIL/uL (ref 4.22–5.81)
RDW: 14.2 % (ref 11.5–15.5)
WBC: 7 10*3/uL (ref 4.0–10.5)
nRBC: 0 % (ref 0.0–0.2)

## 2023-01-07 LAB — COMPREHENSIVE METABOLIC PANEL
ALT: 14 U/L (ref 0–44)
AST: 18 U/L (ref 15–41)
Albumin: 3.8 g/dL (ref 3.5–5.0)
Alkaline Phosphatase: 124 U/L (ref 38–126)
Anion gap: 6 (ref 5–15)
BUN: 26 mg/dL — ABNORMAL HIGH (ref 8–23)
CO2: 28 mmol/L (ref 22–32)
Calcium: 8.8 mg/dL — ABNORMAL LOW (ref 8.9–10.3)
Chloride: 109 mmol/L (ref 98–111)
Creatinine, Ser: 2.08 mg/dL — ABNORMAL HIGH (ref 0.61–1.24)
GFR, Estimated: 32 mL/min — ABNORMAL LOW (ref >60)
Glucose, Bld: 120 mg/dL — ABNORMAL HIGH (ref 70–99)
Potassium: 3.4 mmol/L — ABNORMAL LOW (ref 3.5–5.1)
Sodium: 143 mmol/L (ref 135–145)
Total Bilirubin: 0.3 mg/dL (ref 0.3–1.2)
Total Protein: 7.5 g/dL (ref 6.5–8.1)

## 2023-01-07 LAB — URINALYSIS, ROUTINE W REFLEX MICROSCOPIC
Bilirubin Urine: NEGATIVE
Glucose, UA: NEGATIVE mg/dL
Ketones, ur: NEGATIVE mg/dL
Nitrite: NEGATIVE
Protein, ur: 30 mg/dL — AB
Specific Gravity, Urine: 1.006 (ref 1.005–1.030)
pH: 7 (ref 5.0–8.0)

## 2023-01-07 LAB — LACTIC ACID, PLASMA
Lactic Acid, Venous: 2.4 mmol/L (ref 0.5–1.9)
Lactic Acid, Venous: 1.4 mmol/L (ref 0.5–1.9)

## 2023-01-07 MED ORDER — CEPHALEXIN 500 MG PO CAPS: 500.0000 mg | ORAL_CAPSULE | Freq: Two times a day (BID) | ORAL | 0 refills | Status: AC

## 2023-01-07 MED ORDER — DIAZEPAM 5 MG PO TABS
5.0000 mg | ORAL_TABLET | Freq: Two times a day (BID) | ORAL | Status: DC
  Administered 2023-01-07: 5 mg via ORAL
  Filled 2023-01-07: qty 1

## 2023-01-07 MED ORDER — HYDROCODONE-ACETAMINOPHEN 7.5-325 MG PO TABS
1.0000 | ORAL_TABLET | Freq: Two times a day (BID) | ORAL | Status: DC
  Administered 2023-01-07: 1 via ORAL
  Filled 2023-01-07 (×5): qty 1

## 2023-01-07 MED ORDER — SODIUM CHLORIDE 0.9 % IV BOLUS
1000.0000 mL | Freq: Once | INTRAVENOUS | Status: AC
  Administered 2023-01-07: 1000 mL via INTRAVENOUS

## 2023-01-07 MED ORDER — SODIUM CHLORIDE 0.9 % IV SOLN
1.0000 g | Freq: Once | INTRAVENOUS | Status: AC
  Administered 2023-01-07: 1 g via INTRAVENOUS
  Filled 2023-01-07: qty 10

## 2023-01-07 NOTE — ED Notes (Addendum)
CSW consulted as pt has been attempting to get an appointment for wound care. CSW left voicemail for [facility] outpatient rehab to confirm referral has been received. CSW made referral for outpatient wound care follow up. CSW also added information to AVS so that pt can call to check on appointment follow up.    Pt is on the work que to come to the outpatient wound care center. CSW confirmed that pt has orders for wound care. Pts wife told wound care that there were other issues and that she could not set up the appointment as she was getting pt to the ER. Wound care center states they requested the pts wife return call to set up appointment when she is able. TOC signing off.

## 2023-01-07 NOTE — ED Triage Notes (Signed)
Pt present with pressure ulcer to right ankle. No drainage noted. Has been seen by PCP and applying a cream and a patch every night. Pt was given multiple medications but has not taken some of the meds due to risk of cdiff. EMS and patient do not remember name of medication. Pt mobility has decreased due to pain in ankle. Denies any fall uses walker to get around.

## 2023-01-07 NOTE — Discharge Instructions (Addendum)
Today's evaluation has been generally reassuring.  You do have evidence for infection locally around your open wound on her ankle, but no evidence for systemic infection.  It is important to take your newly prescribed antibiotics and follow-up at the wound care center.  Our social work colleagues have worked to ensure that you are able to be seen there.  Please call today or tomorrow morning to ensure that you are seen in the next few days.  Return here for concerning changes in your condition.

## 2023-01-07 NOTE — ED Provider Notes (Signed)
[place] EMERGENCY DEPARTMENT AT [facility] Provider Note   CSN: [id number] Arrival date & time: 01/07/23  1113     History  Chief Complaint  Patient presents with   Wound Check    Jordan Ward is a 82 y.o. male.  HPI Patient presents with concern of pain in the right lateral distal ankle.  He is here with his wife who assists with the history though he arrives via EMS. He has had a wound there for at least 10 years, waxing, waning severity, and during this most recent exacerbation the patient has been seen by his primary care physician's office, has been referred for a wound care center but has not completed that follow-through yet.  He is decreased his previously 4 times daily dosing of narcotics, now up to once daily.  He notes that he has worsening pain in the area, no fever, vomiting, other pain or complaints.     Home Medications Prior to Admission medications   Medication Sig Start Date End Date Taking? Authorizing Provider  cephALEXin (KEFLEX) 500 MG capsule Take 1 capsule (500 mg total) by mouth 2 (two) times daily for 5 days. 01/07/23 01/12/23 Yes Gerhard Munch, MD  acetaminophen (TYLENOL) 325 MG tablet Take 650 mg by mouth every 6 (six) hours as needed for mild pain, moderate pain, fever or headache.    [provider]  amLODipine (NORVASC) 10 MG tablet Take 5 mg by mouth every evening. After supper. 05/21/21   [provider]  Carboxymethylcellulose Sodium (DRY EYE RELIEF OP) Place 1 drop into both eyes 4 (four) times daily as needed (dry eyes).    [provider]  diazepam (VALIUM) 5 MG tablet Take 1 tablet (5 mg total) by mouth 2 (two) times daily. 08/28/22   Glade Lloyd, MD  HYDROcodone-acetaminophen (NORCO) 7.5-325 MG tablet Take 1 tablet by mouth 2 (two) times daily. Max APAP 3 GM IN 24 HOURS FROM ALL SOURCES 08/28/22   Glade Lloyd, MD  levofloxacin (LEVAQUIN) 500 MG tablet Take 1 as directed 10/09/22   Marcine Matar,  MD  levothyroxine (SYNTHROID) 75 MCG tablet Take 75 mcg by mouth in the morning. 07/02/21   [provider]  loperamide (IMODIUM A-D) 2 MG tablet Take 1 tablet (2 mg total) by mouth 4 (four) times daily as needed for diarrhea or loose stools. 05/22/22   Sherryll Burger, Pratik D, DO  OXYGEN Inhale 1 L into the lungs every evening.    [provider]  potassium chloride SA (KLOR-CON) 20 MEQ tablet Take 20 mEq by mouth every evening. After supper 07/02/21   [provider]  tiZANidine (ZANAFLEX) 4 MG tablet Take 1 tablet (4 mg total) by mouth every 6 (six) hours as needed for muscle spasms. Do not take with alcohol or while driving or operating heavy machinery.  May cause drowsiness. 10/13/22   Valentino Nose, NP      Allergies    Tetracyclines & related and Cipro [ciprofloxacin hcl]    Review of Systems   Review of Systems  All other systems reviewed and are negative.   Physical Exam Updated Vital Signs BP (!) 141/69   Pulse 64   Temp 97.9 F (36.6 C) (Oral)   Resp 18   Ht  (1.651 m)   Wt 50.8 kg   SpO2 98%   BMI 18.64 kg/m  Physical Exam Vitals and nursing note reviewed.  Constitutional:      General: He is not in acute  distress.    Appearance: He is well-developed. He is ill-appearing. He is not toxic-appearing or diaphoretic.  HENT:     Head: Normocephalic and atraumatic.  Eyes:     Conjunctiva/sclera: Conjunctivae normal.  Cardiovascular:     Rate and Rhythm: Normal rate and regular rhythm.     Pulses: Normal pulses.  Pulmonary:     Effort: Pulmonary effort is normal. No respiratory distress.     Breath sounds: No stridor.  Abdominal:     General: There is no distension.  Musculoskeletal:       Legs:  Skin:    General: Skin is warm and dry.  Neurological:     Mental Status: He is alert and oriented to person, place, and time.     ED Results / Procedures / Treatments   Labs (all labs ordered are listed, but only abnormal results are  displayed) Labs Reviewed  COMPREHENSIVE METABOLIC PANEL - Abnormal; Notable for the following components:      Result Value   Potassium 3.4 (*)    Glucose, Bld 120 (*)    BUN 26 (*)    Creatinine, Ser 2.08 (*)    Calcium 8.8 (*)    GFR, Estimated 32 (*)    All other components within normal limits  CBC WITH DIFFERENTIAL/PLATELET - Abnormal; Notable for the following components:   Hemoglobin 12.8 (*)    All other components within normal limits  LACTIC ACID, PLASMA - Abnormal; Notable for the following components:   Lactic Acid, Venous 2.4 (*)    All other components within normal limits  LACTIC ACID, PLASMA  URINALYSIS, ROUTINE W REFLEX MICROSCOPIC    EKG None  Radiology DG Foot Complete Right  Result Date: 01/07/2023 CLINICAL DATA:  Open wound EXAM: RIGHT FOOT COMPLETE - 3 VIEW COMPARISON:  None Available. FINDINGS: First metatarsophalangeal degenerative changes with joint space narrowing. No acute fracture, dislocation or subluxation identified. Osseous structures are osteopenic. IMPRESSION: Degenerative changes.  Osteopenia.  No acute osseous abnormalities. Electronically Signed   By: Layla MawJoshua  Pleasure M.D.   On: 01/07/2023 12:23    Procedures Procedures    Medications Ordered in ED Medications  diazepam (VALIUM) tablet 5 mg (5 mg Oral Given 01/07/23 1238)  HYDROcodone-acetaminophen (NORCO) 7.5-325 MG per tablet 1 tablet (1 tablet Oral Given 01/07/23 1357)  sodium chloride 0.9 % bolus 1,000 mL (1,000 mLs Intravenous New Bag/Given 01/07/23 1357)  cefTRIAXone (ROCEPHIN) 1 g in sodium chloride 0.9 % 100 mL IVPB (1 g Intravenous New Bag/Given 01/07/23 1355)    ED Course/ Medical Decision Making/ A&P                             Medical Decision Making Patient with multiple medical problems, chronically on benzodiazepines and narcotics now presents with worsening pain around the right lateral distal ankle lesion.  Patient is awake, alert, afebrile, not hypotensive, not  tachycardic, no initial findings suspicious for bacteremia, sepsis though he does have a history of these.  Given the history of CKD as well, differential of systemic infection versus endorgan dysfunction, with local evidence for cellulitis and open wound considered. I will monitor heart rate 65 sinus normal Pulse ox 100% room air normal   Amount and/or Complexity of Data Reviewed Independent Historian: spouse External Data Reviewed: notes. Labs: ordered. Decision-making details documented in ED Course. Radiology: ordered and independent interpretation performed. Decision-making details documented in ED Course.  Risk Prescription drug management. Decision regarding  hospitalization. Emergency major surgery.   4:17 PM Patient was initial mild lactic acidosis has resolved.  He has slight elevation in BUN/creatinine, both also consistent with mild dehydration, but no leukocytosis, no fever, no hypotension suggesting bacteremia or sepsis. Patient has not yet provided urinalysis, but with treatment with ceftriaxone here, Keflex as an outpatient, this is appropriate for possible urinary tract infection which is less likely as the patient has no urinary symptoms, and again is afebrile, without abdominal pain, flank pain. I discussed the patient's case with our social work colleagues to facilitate outpatient wound care and this was accommodated, with instructions provided to the patient and on his discharge instructions. Absent evidence for bacteremia, sepsis, patient discharged to follow-up closely at wound care and with primary care.        Final Clinical Impression(s) / ED Diagnoses Final diagnoses:  Pressure injury of skin of right heel, unspecified injury stage  Cellulitis of right lower extremity  Dehydration    Rx / DC Orders ED Discharge Orders          Ordered    Ambulatory referral to Physical Therapy       Comments: Patient needs wound care appointment.   01/07/23 1452     cephALEXin (KEFLEX) 500 MG capsule  2 times daily        01/07/23 1616              Gerhard Munch, MD 01/07/23 [id number]

## 2023-01-12 ENCOUNTER — Telehealth: Payer: Self-pay

## 2023-01-12 DIAGNOSIS — I739 Peripheral vascular disease, unspecified: Secondary | ICD-10-CM | POA: Diagnosis not present

## 2023-01-12 DIAGNOSIS — E0590 Thyrotoxicosis, unspecified without thyrotoxic crisis or storm: Secondary | ICD-10-CM | POA: Diagnosis not present

## 2023-01-12 DIAGNOSIS — F411 Generalized anxiety disorder: Secondary | ICD-10-CM | POA: Diagnosis not present

## 2023-01-12 DIAGNOSIS — G35 Multiple sclerosis: Secondary | ICD-10-CM | POA: Diagnosis not present

## 2023-01-12 DIAGNOSIS — M545 Low back pain, unspecified: Secondary | ICD-10-CM | POA: Diagnosis not present

## 2023-01-12 DIAGNOSIS — S91001D Unspecified open wound, right ankle, subsequent encounter: Secondary | ICD-10-CM | POA: Diagnosis not present

## 2023-01-12 DIAGNOSIS — R1310 Dysphagia, unspecified: Secondary | ICD-10-CM | POA: Diagnosis not present

## 2023-01-12 DIAGNOSIS — R7303 Prediabetes: Secondary | ICD-10-CM | POA: Diagnosis not present

## 2023-01-12 DIAGNOSIS — F320 Major depressive disorder, single episode, mild: Secondary | ICD-10-CM | POA: Diagnosis not present

## 2023-01-12 DIAGNOSIS — Z9981 Dependence on supplemental oxygen: Secondary | ICD-10-CM | POA: Diagnosis not present

## 2023-01-12 DIAGNOSIS — G894 Chronic pain syndrome: Secondary | ICD-10-CM | POA: Diagnosis not present

## 2023-01-12 DIAGNOSIS — I129 Hypertensive chronic kidney disease with stage 1 through stage 4 chronic kidney disease, or unspecified chronic kidney disease: Secondary | ICD-10-CM | POA: Diagnosis not present

## 2023-01-12 DIAGNOSIS — N184 Chronic kidney disease, stage 4 (severe): Secondary | ICD-10-CM | POA: Diagnosis not present

## 2023-01-12 DIAGNOSIS — L89313 Pressure ulcer of right buttock, stage 3: Secondary | ICD-10-CM | POA: Diagnosis not present

## 2023-01-12 DIAGNOSIS — N319 Neuromuscular dysfunction of bladder, unspecified: Secondary | ICD-10-CM | POA: Diagnosis not present

## 2023-01-12 DIAGNOSIS — J449 Chronic obstructive pulmonary disease, unspecified: Secondary | ICD-10-CM | POA: Diagnosis not present

## 2023-01-12 DIAGNOSIS — Z9181 History of falling: Secondary | ICD-10-CM | POA: Diagnosis not present

## 2023-01-12 NOTE — Telephone Encounter (Signed)
     Patient  visit on 01/07/2023  at [facility] was for wound check.  Have you been able to follow up with your primary care physician? Yes  The patient was or was not able to obtain any needed medicine or equipment. Patient was able to obtain medication.  Are there diet recommendations that you are having difficulty following? No  Patient expresses understanding of discharge instructions and education provided has no other needs at this time. Yes  Patient consented to [facility] referral being sent to [facility] and food pantries.   [facility]  [facility] Population Health Community Resource Care Guide   [email or web address]  ?? [phone]   Website: [email or web address]  [email or web address]

## 2023-01-13 ENCOUNTER — Ambulatory Visit: Payer: Medicare Other

## 2023-01-15 ENCOUNTER — Ambulatory Visit: Payer: Medicare Other

## 2023-01-15 DIAGNOSIS — F320 Major depressive disorder, single episode, mild: Secondary | ICD-10-CM | POA: Diagnosis not present

## 2023-01-15 DIAGNOSIS — G35 Multiple sclerosis: Secondary | ICD-10-CM | POA: Diagnosis not present

## 2023-01-15 DIAGNOSIS — S91001D Unspecified open wound, right ankle, subsequent encounter: Secondary | ICD-10-CM | POA: Diagnosis not present

## 2023-01-15 DIAGNOSIS — G894 Chronic pain syndrome: Secondary | ICD-10-CM | POA: Diagnosis not present

## 2023-01-15 DIAGNOSIS — E0590 Thyrotoxicosis, unspecified without thyrotoxic crisis or storm: Secondary | ICD-10-CM | POA: Diagnosis not present

## 2023-01-15 DIAGNOSIS — I129 Hypertensive chronic kidney disease with stage 1 through stage 4 chronic kidney disease, or unspecified chronic kidney disease: Secondary | ICD-10-CM | POA: Diagnosis not present

## 2023-01-19 DIAGNOSIS — E0590 Thyrotoxicosis, unspecified without thyrotoxic crisis or storm: Secondary | ICD-10-CM | POA: Diagnosis not present

## 2023-01-19 DIAGNOSIS — G35 Multiple sclerosis: Secondary | ICD-10-CM | POA: Diagnosis not present

## 2023-01-19 DIAGNOSIS — G894 Chronic pain syndrome: Secondary | ICD-10-CM | POA: Diagnosis not present

## 2023-01-19 DIAGNOSIS — S91001D Unspecified open wound, right ankle, subsequent encounter: Secondary | ICD-10-CM | POA: Diagnosis not present

## 2023-01-19 DIAGNOSIS — I129 Hypertensive chronic kidney disease with stage 1 through stage 4 chronic kidney disease, or unspecified chronic kidney disease: Secondary | ICD-10-CM | POA: Diagnosis not present

## 2023-01-19 DIAGNOSIS — F320 Major depressive disorder, single episode, mild: Secondary | ICD-10-CM | POA: Diagnosis not present

## 2023-01-20 ENCOUNTER — Telehealth: Payer: Self-pay

## 2023-01-20 NOTE — Telephone Encounter (Signed)
Please see below.  Will you give verbal orders for [facility] to change catheters?

## 2023-01-20 NOTE — Telephone Encounter (Signed)
Patient's wife called advising that patients leg is still injured and he is still unable to walk. She advised that the Home Health nurse is wiling to change his catheter if they receive a written order.   [facility] Phone: [phone] Fax: [phone]   Thank you

## 2023-01-22 DIAGNOSIS — G894 Chronic pain syndrome: Secondary | ICD-10-CM | POA: Diagnosis not present

## 2023-01-22 DIAGNOSIS — I129 Hypertensive chronic kidney disease with stage 1 through stage 4 chronic kidney disease, or unspecified chronic kidney disease: Secondary | ICD-10-CM | POA: Diagnosis not present

## 2023-01-22 DIAGNOSIS — F320 Major depressive disorder, single episode, mild: Secondary | ICD-10-CM | POA: Diagnosis not present

## 2023-01-22 DIAGNOSIS — G35 Multiple sclerosis: Secondary | ICD-10-CM | POA: Diagnosis not present

## 2023-01-22 DIAGNOSIS — E0590 Thyrotoxicosis, unspecified without thyrotoxic crisis or storm: Secondary | ICD-10-CM | POA: Diagnosis not present

## 2023-01-22 DIAGNOSIS — S91001D Unspecified open wound, right ankle, subsequent encounter: Secondary | ICD-10-CM | POA: Diagnosis not present

## 2023-01-23 ENCOUNTER — Ambulatory Visit: Payer: Medicare Other

## 2023-01-23 NOTE — Telephone Encounter (Signed)
Spoke with Judeth Cornfield and [facility]. Verbal order given for Home Health to change SP tube 45F monthly with 10cc sterile water to balloon.  Patient currently homebound.

## 2023-01-26 DIAGNOSIS — G35 Multiple sclerosis: Secondary | ICD-10-CM | POA: Diagnosis not present

## 2023-01-26 DIAGNOSIS — S91001D Unspecified open wound, right ankle, subsequent encounter: Secondary | ICD-10-CM | POA: Diagnosis not present

## 2023-01-26 DIAGNOSIS — I129 Hypertensive chronic kidney disease with stage 1 through stage 4 chronic kidney disease, or unspecified chronic kidney disease: Secondary | ICD-10-CM | POA: Diagnosis not present

## 2023-01-26 DIAGNOSIS — E0590 Thyrotoxicosis, unspecified without thyrotoxic crisis or storm: Secondary | ICD-10-CM | POA: Diagnosis not present

## 2023-01-26 DIAGNOSIS — F320 Major depressive disorder, single episode, mild: Secondary | ICD-10-CM | POA: Diagnosis not present

## 2023-01-26 DIAGNOSIS — G894 Chronic pain syndrome: Secondary | ICD-10-CM | POA: Diagnosis not present

## 2023-01-29 DIAGNOSIS — G35 Multiple sclerosis: Secondary | ICD-10-CM | POA: Diagnosis not present

## 2023-01-29 DIAGNOSIS — S91001D Unspecified open wound, right ankle, subsequent encounter: Secondary | ICD-10-CM | POA: Diagnosis not present

## 2023-01-29 DIAGNOSIS — I129 Hypertensive chronic kidney disease with stage 1 through stage 4 chronic kidney disease, or unspecified chronic kidney disease: Secondary | ICD-10-CM | POA: Diagnosis not present

## 2023-01-29 DIAGNOSIS — E0590 Thyrotoxicosis, unspecified without thyrotoxic crisis or storm: Secondary | ICD-10-CM | POA: Diagnosis not present

## 2023-01-29 DIAGNOSIS — G894 Chronic pain syndrome: Secondary | ICD-10-CM | POA: Diagnosis not present

## 2023-01-29 DIAGNOSIS — F320 Major depressive disorder, single episode, mild: Secondary | ICD-10-CM | POA: Diagnosis not present

## 2023-02-02 DIAGNOSIS — I129 Hypertensive chronic kidney disease with stage 1 through stage 4 chronic kidney disease, or unspecified chronic kidney disease: Secondary | ICD-10-CM | POA: Diagnosis not present

## 2023-02-02 DIAGNOSIS — G894 Chronic pain syndrome: Secondary | ICD-10-CM | POA: Diagnosis not present

## 2023-02-02 DIAGNOSIS — S91001D Unspecified open wound, right ankle, subsequent encounter: Secondary | ICD-10-CM | POA: Diagnosis not present

## 2023-02-02 DIAGNOSIS — E0590 Thyrotoxicosis, unspecified without thyrotoxic crisis or storm: Secondary | ICD-10-CM | POA: Diagnosis not present

## 2023-02-02 DIAGNOSIS — F320 Major depressive disorder, single episode, mild: Secondary | ICD-10-CM | POA: Diagnosis not present

## 2023-02-02 DIAGNOSIS — G35 Multiple sclerosis: Secondary | ICD-10-CM | POA: Diagnosis not present

## 2023-02-05 DIAGNOSIS — S91001D Unspecified open wound, right ankle, subsequent encounter: Secondary | ICD-10-CM | POA: Diagnosis not present

## 2023-02-05 DIAGNOSIS — I129 Hypertensive chronic kidney disease with stage 1 through stage 4 chronic kidney disease, or unspecified chronic kidney disease: Secondary | ICD-10-CM | POA: Diagnosis not present

## 2023-02-05 DIAGNOSIS — F320 Major depressive disorder, single episode, mild: Secondary | ICD-10-CM | POA: Diagnosis not present

## 2023-02-05 DIAGNOSIS — E0590 Thyrotoxicosis, unspecified without thyrotoxic crisis or storm: Secondary | ICD-10-CM | POA: Diagnosis not present

## 2023-02-05 DIAGNOSIS — G894 Chronic pain syndrome: Secondary | ICD-10-CM | POA: Diagnosis not present

## 2023-02-05 DIAGNOSIS — G35 Multiple sclerosis: Secondary | ICD-10-CM | POA: Diagnosis not present

## 2023-02-10 DIAGNOSIS — S91001D Unspecified open wound, right ankle, subsequent encounter: Secondary | ICD-10-CM | POA: Diagnosis not present

## 2023-02-10 DIAGNOSIS — E0590 Thyrotoxicosis, unspecified without thyrotoxic crisis or storm: Secondary | ICD-10-CM | POA: Diagnosis not present

## 2023-02-10 DIAGNOSIS — G35 Multiple sclerosis: Secondary | ICD-10-CM | POA: Diagnosis not present

## 2023-02-10 DIAGNOSIS — F320 Major depressive disorder, single episode, mild: Secondary | ICD-10-CM | POA: Diagnosis not present

## 2023-02-10 DIAGNOSIS — G894 Chronic pain syndrome: Secondary | ICD-10-CM | POA: Diagnosis not present

## 2023-02-10 DIAGNOSIS — I129 Hypertensive chronic kidney disease with stage 1 through stage 4 chronic kidney disease, or unspecified chronic kidney disease: Secondary | ICD-10-CM | POA: Diagnosis not present

## 2023-02-11 DIAGNOSIS — F411 Generalized anxiety disorder: Secondary | ICD-10-CM | POA: Diagnosis not present

## 2023-02-11 DIAGNOSIS — N184 Chronic kidney disease, stage 4 (severe): Secondary | ICD-10-CM | POA: Diagnosis not present

## 2023-02-11 DIAGNOSIS — J449 Chronic obstructive pulmonary disease, unspecified: Secondary | ICD-10-CM | POA: Diagnosis not present

## 2023-02-11 DIAGNOSIS — F320 Major depressive disorder, single episode, mild: Secondary | ICD-10-CM | POA: Diagnosis not present

## 2023-02-11 DIAGNOSIS — M545 Low back pain, unspecified: Secondary | ICD-10-CM | POA: Diagnosis not present

## 2023-02-11 DIAGNOSIS — N319 Neuromuscular dysfunction of bladder, unspecified: Secondary | ICD-10-CM | POA: Diagnosis not present

## 2023-02-11 DIAGNOSIS — R1310 Dysphagia, unspecified: Secondary | ICD-10-CM | POA: Diagnosis not present

## 2023-02-11 DIAGNOSIS — E0590 Thyrotoxicosis, unspecified without thyrotoxic crisis or storm: Secondary | ICD-10-CM | POA: Diagnosis not present

## 2023-02-11 DIAGNOSIS — I739 Peripheral vascular disease, unspecified: Secondary | ICD-10-CM | POA: Diagnosis not present

## 2023-02-11 DIAGNOSIS — S91001D Unspecified open wound, right ankle, subsequent encounter: Secondary | ICD-10-CM | POA: Diagnosis not present

## 2023-02-11 DIAGNOSIS — G35 Multiple sclerosis: Secondary | ICD-10-CM | POA: Diagnosis not present

## 2023-02-11 DIAGNOSIS — G894 Chronic pain syndrome: Secondary | ICD-10-CM | POA: Diagnosis not present

## 2023-02-11 DIAGNOSIS — Z9981 Dependence on supplemental oxygen: Secondary | ICD-10-CM | POA: Diagnosis not present

## 2023-02-11 DIAGNOSIS — I129 Hypertensive chronic kidney disease with stage 1 through stage 4 chronic kidney disease, or unspecified chronic kidney disease: Secondary | ICD-10-CM | POA: Diagnosis not present

## 2023-02-11 DIAGNOSIS — L89313 Pressure ulcer of right buttock, stage 3: Secondary | ICD-10-CM | POA: Diagnosis not present

## 2023-02-11 DIAGNOSIS — R7303 Prediabetes: Secondary | ICD-10-CM | POA: Diagnosis not present

## 2023-02-11 DIAGNOSIS — Z9181 History of falling: Secondary | ICD-10-CM | POA: Diagnosis not present

## 2023-02-16 DIAGNOSIS — F320 Major depressive disorder, single episode, mild: Secondary | ICD-10-CM | POA: Diagnosis not present

## 2023-02-16 DIAGNOSIS — S91001D Unspecified open wound, right ankle, subsequent encounter: Secondary | ICD-10-CM | POA: Diagnosis not present

## 2023-02-16 DIAGNOSIS — E0590 Thyrotoxicosis, unspecified without thyrotoxic crisis or storm: Secondary | ICD-10-CM | POA: Diagnosis not present

## 2023-02-16 DIAGNOSIS — I129 Hypertensive chronic kidney disease with stage 1 through stage 4 chronic kidney disease, or unspecified chronic kidney disease: Secondary | ICD-10-CM | POA: Diagnosis not present

## 2023-02-16 DIAGNOSIS — G894 Chronic pain syndrome: Secondary | ICD-10-CM | POA: Diagnosis not present

## 2023-02-16 DIAGNOSIS — G35 Multiple sclerosis: Secondary | ICD-10-CM | POA: Diagnosis not present

## 2023-02-24 ENCOUNTER — Other Ambulatory Visit (HOSPITAL_COMMUNITY): Payer: Medicare Other

## 2023-02-24 DIAGNOSIS — E0590 Thyrotoxicosis, unspecified without thyrotoxic crisis or storm: Secondary | ICD-10-CM | POA: Diagnosis not present

## 2023-02-24 DIAGNOSIS — F320 Major depressive disorder, single episode, mild: Secondary | ICD-10-CM | POA: Diagnosis not present

## 2023-02-24 DIAGNOSIS — S91001D Unspecified open wound, right ankle, subsequent encounter: Secondary | ICD-10-CM | POA: Diagnosis not present

## 2023-02-24 DIAGNOSIS — G894 Chronic pain syndrome: Secondary | ICD-10-CM | POA: Diagnosis not present

## 2023-02-24 DIAGNOSIS — G35 Multiple sclerosis: Secondary | ICD-10-CM | POA: Diagnosis not present

## 2023-02-24 DIAGNOSIS — I129 Hypertensive chronic kidney disease with stage 1 through stage 4 chronic kidney disease, or unspecified chronic kidney disease: Secondary | ICD-10-CM | POA: Diagnosis not present

## 2023-02-25 ENCOUNTER — Encounter (HOSPITAL_COMMUNITY): Payer: Self-pay | Admitting: Interventional Radiology

## 2023-02-25 ENCOUNTER — Ambulatory Visit (HOSPITAL_COMMUNITY)
Admission: RE | Admit: 2023-02-25 | Discharge: 2023-02-25 | Disposition: A | Discharge status: Home / Self Care | Payer: Medicare Other | Source: Ambulatory Visit | Attending: Interventional Radiology | Admitting: Interventional Radiology

## 2023-02-25 DIAGNOSIS — N319 Neuromuscular dysfunction of bladder, unspecified: Secondary | ICD-10-CM | POA: Diagnosis not present

## 2023-02-25 DIAGNOSIS — Z466 Encounter for fitting and adjustment of urinary device: Secondary | ICD-10-CM | POA: Diagnosis not present

## 2023-02-25 HISTORY — PX: IR NEPHROSTOMY EXCHANGE LEFT: IMG6069

## 2023-02-25 MED ORDER — LIDOCAINE-EPINEPHRINE 1 %-1:100000 IJ SOLN
INTRAMUSCULAR | Status: AC
  Filled 2023-02-25: qty 1

## 2023-02-25 MED ORDER — IOHEXOL 300 MG/ML  SOLN: 50.0000 mL | Freq: Once | INTRAMUSCULAR | Status: DC | PRN

## 2023-02-26 ENCOUNTER — Other Ambulatory Visit (HOSPITAL_COMMUNITY): Payer: Self-pay | Admitting: Interventional Radiology

## 2023-02-26 DIAGNOSIS — N319 Neuromuscular dysfunction of bladder, unspecified: Secondary | ICD-10-CM

## 2023-03-02 ENCOUNTER — Ambulatory Visit (INDEPENDENT_AMBULATORY_CARE_PROVIDER_SITE_OTHER): Payer: Medicare Other

## 2023-03-02 DIAGNOSIS — R339 Retention of urine, unspecified: Secondary | ICD-10-CM | POA: Diagnosis not present

## 2023-03-02 DIAGNOSIS — G35 Multiple sclerosis: Secondary | ICD-10-CM | POA: Diagnosis not present

## 2023-03-02 DIAGNOSIS — F320 Major depressive disorder, single episode, mild: Secondary | ICD-10-CM | POA: Diagnosis not present

## 2023-03-02 DIAGNOSIS — E0590 Thyrotoxicosis, unspecified without thyrotoxic crisis or storm: Secondary | ICD-10-CM | POA: Diagnosis not present

## 2023-03-02 DIAGNOSIS — I129 Hypertensive chronic kidney disease with stage 1 through stage 4 chronic kidney disease, or unspecified chronic kidney disease: Secondary | ICD-10-CM | POA: Diagnosis not present

## 2023-03-02 DIAGNOSIS — S91001D Unspecified open wound, right ankle, subsequent encounter: Secondary | ICD-10-CM | POA: Diagnosis not present

## 2023-03-02 DIAGNOSIS — G894 Chronic pain syndrome: Secondary | ICD-10-CM | POA: Diagnosis not present

## 2023-03-02 NOTE — Progress Notes (Signed)
Suprapubic Cath Change  Patient is present today for a suprapubic catheter change due to urinary retention.  10ml of water was drained from the balloon, a 24FR foley cath was removed from the tract with out difficulty.  Site was cleaned and prepped in a sterile fashion with betadine.  A 24FR foley cath was replaced into the tract no complications were noted. Urine return was noted, 10 ml of sterile water was inflated into the balloon and a leg bag was attached for drainage.  Patient tolerated well.   Performed by: Aryahi Denzler LPN  Follow up: keep scheduled NV  

## 2023-03-10 DIAGNOSIS — E0590 Thyrotoxicosis, unspecified without thyrotoxic crisis or storm: Secondary | ICD-10-CM | POA: Diagnosis not present

## 2023-03-10 DIAGNOSIS — S91001D Unspecified open wound, right ankle, subsequent encounter: Secondary | ICD-10-CM | POA: Diagnosis not present

## 2023-03-10 DIAGNOSIS — G35 Multiple sclerosis: Secondary | ICD-10-CM | POA: Diagnosis not present

## 2023-03-10 DIAGNOSIS — I129 Hypertensive chronic kidney disease with stage 1 through stage 4 chronic kidney disease, or unspecified chronic kidney disease: Secondary | ICD-10-CM | POA: Diagnosis not present

## 2023-03-10 DIAGNOSIS — F320 Major depressive disorder, single episode, mild: Secondary | ICD-10-CM | POA: Diagnosis not present

## 2023-03-10 DIAGNOSIS — G894 Chronic pain syndrome: Secondary | ICD-10-CM | POA: Diagnosis not present

## 2023-03-13 DIAGNOSIS — L89313 Pressure ulcer of right buttock, stage 3: Secondary | ICD-10-CM | POA: Diagnosis not present

## 2023-03-13 DIAGNOSIS — N319 Neuromuscular dysfunction of bladder, unspecified: Secondary | ICD-10-CM | POA: Diagnosis not present

## 2023-03-13 DIAGNOSIS — I739 Peripheral vascular disease, unspecified: Secondary | ICD-10-CM | POA: Diagnosis not present

## 2023-03-13 DIAGNOSIS — N184 Chronic kidney disease, stage 4 (severe): Secondary | ICD-10-CM | POA: Diagnosis not present

## 2023-03-13 DIAGNOSIS — R7303 Prediabetes: Secondary | ICD-10-CM | POA: Diagnosis not present

## 2023-03-13 DIAGNOSIS — J449 Chronic obstructive pulmonary disease, unspecified: Secondary | ICD-10-CM | POA: Diagnosis not present

## 2023-03-13 DIAGNOSIS — F411 Generalized anxiety disorder: Secondary | ICD-10-CM | POA: Diagnosis not present

## 2023-03-13 DIAGNOSIS — G894 Chronic pain syndrome: Secondary | ICD-10-CM | POA: Diagnosis not present

## 2023-03-13 DIAGNOSIS — S91001D Unspecified open wound, right ankle, subsequent encounter: Secondary | ICD-10-CM | POA: Diagnosis not present

## 2023-03-13 DIAGNOSIS — F320 Major depressive disorder, single episode, mild: Secondary | ICD-10-CM | POA: Diagnosis not present

## 2023-03-13 DIAGNOSIS — Z466 Encounter for fitting and adjustment of urinary device: Secondary | ICD-10-CM | POA: Diagnosis not present

## 2023-03-13 DIAGNOSIS — I129 Hypertensive chronic kidney disease with stage 1 through stage 4 chronic kidney disease, or unspecified chronic kidney disease: Secondary | ICD-10-CM | POA: Diagnosis not present

## 2023-03-13 DIAGNOSIS — Z9981 Dependence on supplemental oxygen: Secondary | ICD-10-CM | POA: Diagnosis not present

## 2023-03-13 DIAGNOSIS — E0590 Thyrotoxicosis, unspecified without thyrotoxic crisis or storm: Secondary | ICD-10-CM | POA: Diagnosis not present

## 2023-03-13 DIAGNOSIS — G35 Multiple sclerosis: Secondary | ICD-10-CM | POA: Diagnosis not present

## 2023-03-13 DIAGNOSIS — R1310 Dysphagia, unspecified: Secondary | ICD-10-CM | POA: Diagnosis not present

## 2023-03-13 DIAGNOSIS — M545 Low back pain, unspecified: Secondary | ICD-10-CM | POA: Diagnosis not present

## 2023-03-17 DIAGNOSIS — N184 Chronic kidney disease, stage 4 (severe): Secondary | ICD-10-CM | POA: Diagnosis not present

## 2023-03-17 DIAGNOSIS — G35 Multiple sclerosis: Secondary | ICD-10-CM | POA: Diagnosis not present

## 2023-03-17 DIAGNOSIS — I129 Hypertensive chronic kidney disease with stage 1 through stage 4 chronic kidney disease, or unspecified chronic kidney disease: Secondary | ICD-10-CM | POA: Diagnosis not present

## 2023-03-17 DIAGNOSIS — L89313 Pressure ulcer of right buttock, stage 3: Secondary | ICD-10-CM | POA: Diagnosis not present

## 2023-03-17 DIAGNOSIS — S91001D Unspecified open wound, right ankle, subsequent encounter: Secondary | ICD-10-CM | POA: Diagnosis not present

## 2023-03-17 DIAGNOSIS — F320 Major depressive disorder, single episode, mild: Secondary | ICD-10-CM | POA: Diagnosis not present

## 2023-03-18 DIAGNOSIS — R059 Cough, unspecified: Secondary | ICD-10-CM | POA: Diagnosis not present

## 2023-03-18 DIAGNOSIS — Z79891 Long term (current) use of opiate analgesic: Secondary | ICD-10-CM | POA: Diagnosis not present

## 2023-03-18 DIAGNOSIS — R509 Fever, unspecified: Secondary | ICD-10-CM | POA: Diagnosis not present

## 2023-03-18 DIAGNOSIS — J069 Acute upper respiratory infection, unspecified: Secondary | ICD-10-CM | POA: Diagnosis not present

## 2023-03-18 DIAGNOSIS — G894 Chronic pain syndrome: Secondary | ICD-10-CM | POA: Diagnosis not present

## 2023-03-18 DIAGNOSIS — G35 Multiple sclerosis: Secondary | ICD-10-CM | POA: Diagnosis not present

## 2023-03-18 DIAGNOSIS — R0981 Nasal congestion: Secondary | ICD-10-CM | POA: Diagnosis not present

## 2023-03-26 DIAGNOSIS — I129 Hypertensive chronic kidney disease with stage 1 through stage 4 chronic kidney disease, or unspecified chronic kidney disease: Secondary | ICD-10-CM | POA: Diagnosis not present

## 2023-03-26 DIAGNOSIS — G35 Multiple sclerosis: Secondary | ICD-10-CM | POA: Diagnosis not present

## 2023-03-26 DIAGNOSIS — S91001D Unspecified open wound, right ankle, subsequent encounter: Secondary | ICD-10-CM | POA: Diagnosis not present

## 2023-03-26 DIAGNOSIS — N184 Chronic kidney disease, stage 4 (severe): Secondary | ICD-10-CM | POA: Diagnosis not present

## 2023-03-26 DIAGNOSIS — F320 Major depressive disorder, single episode, mild: Secondary | ICD-10-CM | POA: Diagnosis not present

## 2023-03-26 DIAGNOSIS — L89313 Pressure ulcer of right buttock, stage 3: Secondary | ICD-10-CM | POA: Diagnosis not present

## 2023-03-30 ENCOUNTER — Ambulatory Visit: Payer: Medicare Other

## 2023-03-31 ENCOUNTER — Other Ambulatory Visit (HOSPITAL_COMMUNITY): Payer: Self-pay | Admitting: Interventional Radiology

## 2023-03-31 ENCOUNTER — Ambulatory Visit (HOSPITAL_COMMUNITY)
Admission: RE | Admit: 2023-03-31 | Discharge: 2023-03-31 | Disposition: A | Discharge status: Home / Self Care | Payer: Medicare Other | Source: Ambulatory Visit | Attending: Interventional Radiology | Admitting: Interventional Radiology

## 2023-03-31 DIAGNOSIS — Z436 Encounter for attention to other artificial openings of urinary tract: Secondary | ICD-10-CM | POA: Diagnosis not present

## 2023-03-31 DIAGNOSIS — N319 Neuromuscular dysfunction of bladder, unspecified: Secondary | ICD-10-CM | POA: Diagnosis not present

## 2023-03-31 DIAGNOSIS — N1330 Unspecified hydronephrosis: Secondary | ICD-10-CM | POA: Insufficient documentation

## 2023-03-31 HISTORY — PX: IR NEPHROSTOMY EXCHANGE LEFT: IMG6069

## 2023-03-31 MED ORDER — LIDOCAINE-EPINEPHRINE 1 %-1:100000 IJ SOLN
INTRAMUSCULAR | Status: AC
  Filled 2023-03-31: qty 1

## 2023-03-31 MED ORDER — IOHEXOL 300 MG/ML  SOLN
50.0000 mL | Freq: Once | INTRAMUSCULAR | Status: AC | PRN
  Administered 2023-03-31: 10 mL

## 2023-03-31 NOTE — Procedures (Signed)
Pre Procedure Dx: Hydronephrosis Post Procedure Dx: Same  Successful left 10 Fr PCN exchange.    EBL: None   No immediate complications.   Katherina Right, MD Pager #: [phone]

## 2023-04-03 DIAGNOSIS — L89313 Pressure ulcer of right buttock, stage 3: Secondary | ICD-10-CM | POA: Diagnosis not present

## 2023-04-03 DIAGNOSIS — I129 Hypertensive chronic kidney disease with stage 1 through stage 4 chronic kidney disease, or unspecified chronic kidney disease: Secondary | ICD-10-CM | POA: Diagnosis not present

## 2023-04-03 DIAGNOSIS — S91001D Unspecified open wound, right ankle, subsequent encounter: Secondary | ICD-10-CM | POA: Diagnosis not present

## 2023-04-03 DIAGNOSIS — F320 Major depressive disorder, single episode, mild: Secondary | ICD-10-CM | POA: Diagnosis not present

## 2023-04-03 DIAGNOSIS — N184 Chronic kidney disease, stage 4 (severe): Secondary | ICD-10-CM | POA: Diagnosis not present

## 2023-04-03 DIAGNOSIS — G35 Multiple sclerosis: Secondary | ICD-10-CM | POA: Diagnosis not present

## 2023-04-07 ENCOUNTER — Ambulatory Visit (INDEPENDENT_AMBULATORY_CARE_PROVIDER_SITE_OTHER): Payer: Medicare Other

## 2023-04-07 ENCOUNTER — Ambulatory Visit: Payer: Medicare Other | Admitting: Diagnostic Neuroimaging

## 2023-04-07 DIAGNOSIS — Z435 Encounter for attention to cystostomy: Secondary | ICD-10-CM

## 2023-04-07 DIAGNOSIS — R339 Retention of urine, unspecified: Secondary | ICD-10-CM

## 2023-04-07 MED ORDER — LEVOFLOXACIN 500 MG PO TABS
ORAL_TABLET | ORAL | 1 refills | Status: DC
Start: 2023-04-07 — End: 2023-10-12

## 2023-04-07 NOTE — Progress Notes (Signed)
Suprapubic Cath Change  Patient is present today for a suprapubic catheter change due to urinary retention.  10ml of water was drained from the balloon, a 24FR foley cath was removed from the tract with out difficulty.  Site was cleaned and prepped in a sterile fashion with betadine.  A 24FR foley cath was replaced into the tract no complications were noted. Urine return was noted, 10 ml of sterile water was inflated into the balloon and a leg  bag was attached for drainage.  Patient tolerated well. A night bag was given to patient and proper instruction was given on how to switch bags.    Performed by: Marchelle Folks RN  Follow up: 1 month SP tube change

## 2023-04-08 DIAGNOSIS — N184 Chronic kidney disease, stage 4 (severe): Secondary | ICD-10-CM | POA: Diagnosis not present

## 2023-04-09 DIAGNOSIS — N184 Chronic kidney disease, stage 4 (severe): Secondary | ICD-10-CM | POA: Diagnosis not present

## 2023-04-10 DIAGNOSIS — L89313 Pressure ulcer of right buttock, stage 3: Secondary | ICD-10-CM | POA: Diagnosis not present

## 2023-04-10 DIAGNOSIS — G35 Multiple sclerosis: Secondary | ICD-10-CM | POA: Diagnosis not present

## 2023-04-10 DIAGNOSIS — F320 Major depressive disorder, single episode, mild: Secondary | ICD-10-CM | POA: Diagnosis not present

## 2023-04-10 DIAGNOSIS — S91001D Unspecified open wound, right ankle, subsequent encounter: Secondary | ICD-10-CM | POA: Diagnosis not present

## 2023-04-10 DIAGNOSIS — I129 Hypertensive chronic kidney disease with stage 1 through stage 4 chronic kidney disease, or unspecified chronic kidney disease: Secondary | ICD-10-CM | POA: Diagnosis not present

## 2023-04-10 DIAGNOSIS — N184 Chronic kidney disease, stage 4 (severe): Secondary | ICD-10-CM | POA: Diagnosis not present

## 2023-04-12 DIAGNOSIS — F411 Generalized anxiety disorder: Secondary | ICD-10-CM | POA: Diagnosis not present

## 2023-04-12 DIAGNOSIS — S91001D Unspecified open wound, right ankle, subsequent encounter: Secondary | ICD-10-CM | POA: Diagnosis not present

## 2023-04-12 DIAGNOSIS — L89313 Pressure ulcer of right buttock, stage 3: Secondary | ICD-10-CM | POA: Diagnosis not present

## 2023-04-12 DIAGNOSIS — F320 Major depressive disorder, single episode, mild: Secondary | ICD-10-CM | POA: Diagnosis not present

## 2023-04-12 DIAGNOSIS — I129 Hypertensive chronic kidney disease with stage 1 through stage 4 chronic kidney disease, or unspecified chronic kidney disease: Secondary | ICD-10-CM | POA: Diagnosis not present

## 2023-04-12 DIAGNOSIS — Z9981 Dependence on supplemental oxygen: Secondary | ICD-10-CM | POA: Diagnosis not present

## 2023-04-12 DIAGNOSIS — R7303 Prediabetes: Secondary | ICD-10-CM | POA: Diagnosis not present

## 2023-04-12 DIAGNOSIS — M545 Low back pain, unspecified: Secondary | ICD-10-CM | POA: Diagnosis not present

## 2023-04-12 DIAGNOSIS — E0590 Thyrotoxicosis, unspecified without thyrotoxic crisis or storm: Secondary | ICD-10-CM | POA: Diagnosis not present

## 2023-04-12 DIAGNOSIS — R1310 Dysphagia, unspecified: Secondary | ICD-10-CM | POA: Diagnosis not present

## 2023-04-12 DIAGNOSIS — N184 Chronic kidney disease, stage 4 (severe): Secondary | ICD-10-CM | POA: Diagnosis not present

## 2023-04-12 DIAGNOSIS — G894 Chronic pain syndrome: Secondary | ICD-10-CM | POA: Diagnosis not present

## 2023-04-12 DIAGNOSIS — Z466 Encounter for fitting and adjustment of urinary device: Secondary | ICD-10-CM | POA: Diagnosis not present

## 2023-04-12 DIAGNOSIS — G35 Multiple sclerosis: Secondary | ICD-10-CM | POA: Diagnosis not present

## 2023-04-12 DIAGNOSIS — N319 Neuromuscular dysfunction of bladder, unspecified: Secondary | ICD-10-CM | POA: Diagnosis not present

## 2023-04-12 DIAGNOSIS — J449 Chronic obstructive pulmonary disease, unspecified: Secondary | ICD-10-CM | POA: Diagnosis not present

## 2023-04-12 DIAGNOSIS — I739 Peripheral vascular disease, unspecified: Secondary | ICD-10-CM | POA: Diagnosis not present

## 2023-04-14 DIAGNOSIS — L89313 Pressure ulcer of right buttock, stage 3: Secondary | ICD-10-CM | POA: Diagnosis not present

## 2023-04-14 DIAGNOSIS — S91001D Unspecified open wound, right ankle, subsequent encounter: Secondary | ICD-10-CM | POA: Diagnosis not present

## 2023-04-14 DIAGNOSIS — N184 Chronic kidney disease, stage 4 (severe): Secondary | ICD-10-CM | POA: Diagnosis not present

## 2023-04-14 DIAGNOSIS — F320 Major depressive disorder, single episode, mild: Secondary | ICD-10-CM | POA: Diagnosis not present

## 2023-04-14 DIAGNOSIS — G35 Multiple sclerosis: Secondary | ICD-10-CM | POA: Diagnosis not present

## 2023-04-14 DIAGNOSIS — I129 Hypertensive chronic kidney disease with stage 1 through stage 4 chronic kidney disease, or unspecified chronic kidney disease: Secondary | ICD-10-CM | POA: Diagnosis not present

## 2023-04-23 DIAGNOSIS — F320 Major depressive disorder, single episode, mild: Secondary | ICD-10-CM | POA: Diagnosis not present

## 2023-04-23 DIAGNOSIS — G35 Multiple sclerosis: Secondary | ICD-10-CM | POA: Diagnosis not present

## 2023-04-23 DIAGNOSIS — S91001D Unspecified open wound, right ankle, subsequent encounter: Secondary | ICD-10-CM | POA: Diagnosis not present

## 2023-04-23 DIAGNOSIS — I129 Hypertensive chronic kidney disease with stage 1 through stage 4 chronic kidney disease, or unspecified chronic kidney disease: Secondary | ICD-10-CM | POA: Diagnosis not present

## 2023-04-23 DIAGNOSIS — L89313 Pressure ulcer of right buttock, stage 3: Secondary | ICD-10-CM | POA: Diagnosis not present

## 2023-04-23 DIAGNOSIS — N184 Chronic kidney disease, stage 4 (severe): Secondary | ICD-10-CM | POA: Diagnosis not present

## 2023-04-24 DIAGNOSIS — R262 Difficulty in walking, not elsewhere classified: Secondary | ICD-10-CM | POA: Diagnosis not present

## 2023-04-24 DIAGNOSIS — Z960 Presence of urogenital implants: Secondary | ICD-10-CM | POA: Diagnosis not present

## 2023-04-24 DIAGNOSIS — I129 Hypertensive chronic kidney disease with stage 1 through stage 4 chronic kidney disease, or unspecified chronic kidney disease: Secondary | ICD-10-CM | POA: Diagnosis not present

## 2023-04-24 DIAGNOSIS — N184 Chronic kidney disease, stage 4 (severe): Secondary | ICD-10-CM | POA: Diagnosis not present

## 2023-04-24 DIAGNOSIS — J069 Acute upper respiratory infection, unspecified: Secondary | ICD-10-CM | POA: Diagnosis not present

## 2023-04-24 DIAGNOSIS — N319 Neuromuscular dysfunction of bladder, unspecified: Secondary | ICD-10-CM | POA: Diagnosis not present

## 2023-04-24 DIAGNOSIS — G35 Multiple sclerosis: Secondary | ICD-10-CM | POA: Diagnosis not present

## 2023-04-24 DIAGNOSIS — I739 Peripheral vascular disease, unspecified: Secondary | ICD-10-CM | POA: Diagnosis not present

## 2023-04-24 DIAGNOSIS — I10 Essential (primary) hypertension: Secondary | ICD-10-CM | POA: Diagnosis not present

## 2023-04-28 DIAGNOSIS — S91001D Unspecified open wound, right ankle, subsequent encounter: Secondary | ICD-10-CM | POA: Diagnosis not present

## 2023-04-28 DIAGNOSIS — I129 Hypertensive chronic kidney disease with stage 1 through stage 4 chronic kidney disease, or unspecified chronic kidney disease: Secondary | ICD-10-CM | POA: Diagnosis not present

## 2023-04-28 DIAGNOSIS — F320 Major depressive disorder, single episode, mild: Secondary | ICD-10-CM | POA: Diagnosis not present

## 2023-04-28 DIAGNOSIS — G35 Multiple sclerosis: Secondary | ICD-10-CM | POA: Diagnosis not present

## 2023-04-28 DIAGNOSIS — L89313 Pressure ulcer of right buttock, stage 3: Secondary | ICD-10-CM | POA: Diagnosis not present

## 2023-04-28 DIAGNOSIS — N184 Chronic kidney disease, stage 4 (severe): Secondary | ICD-10-CM | POA: Diagnosis not present

## 2023-05-05 DIAGNOSIS — S91001D Unspecified open wound, right ankle, subsequent encounter: Secondary | ICD-10-CM | POA: Diagnosis not present

## 2023-05-05 DIAGNOSIS — N184 Chronic kidney disease, stage 4 (severe): Secondary | ICD-10-CM | POA: Diagnosis not present

## 2023-05-05 DIAGNOSIS — I129 Hypertensive chronic kidney disease with stage 1 through stage 4 chronic kidney disease, or unspecified chronic kidney disease: Secondary | ICD-10-CM | POA: Diagnosis not present

## 2023-05-05 DIAGNOSIS — G35 Multiple sclerosis: Secondary | ICD-10-CM | POA: Diagnosis not present

## 2023-05-05 DIAGNOSIS — F320 Major depressive disorder, single episode, mild: Secondary | ICD-10-CM | POA: Diagnosis not present

## 2023-05-05 DIAGNOSIS — L89313 Pressure ulcer of right buttock, stage 3: Secondary | ICD-10-CM | POA: Diagnosis not present

## 2023-05-07 NOTE — Patient Instructions (Addendum)
Visit Information  Thank you for taking time to visit with me today. Please don't hesitate to contact me if I can be of assistance to you.   Following are the goals we discussed today:   Goals Addressed               This Visit's Progress     Patient Stated     improve mobility (THN) (pt-stated)        Interventions Today    Flowsheet Row Most Recent Value  Chronic Disease   Chronic disease during today's visit Other, Hypertension (HTN)  [upstream RN CM services, left foot fungus, vaccines suprapubic catheter]  General Interventions   General Interventions Discussed/Reviewed General Interventions Discussed, [facility], Doctor Visits, Annual Foot Exam, Vaccines  Vaccines --  [inquired about vaccines Confirmed with him that he had vaccines while in skilled nursing]  Doctor Visits Discussed/Reviewed Doctor Visits Discussed, PCP, Specialist  PCP/Specialist Visits Compliance with follow-up visit  Exercise Interventions   Exercise Discussed/Reviewed Exercise Discussed, Assistive device use and maintanence  Education Interventions   Education Provided Provided Education  Provided Verbal Education On Foot Care, Community Resources  [assessed ankle scab, Discussed upstream RN CM visits]  Mental Health Interventions   Mental Health Discussed/Reviewed Mental Health Reviewed, Coping Strategies  Pharmacy Interventions   Pharmacy Dicussed/Reviewed Pharmacy Topics Discussed, Affording Medications  Safety Interventions   Safety Discussed/Reviewed Safety Discussed, Home Safety, Fall Risk  Home Safety Assistive Devices              Our next appointment is by telephone on 05/08/23 at 3 pm  Please call the care guide team at [phone] if you need to cancel or reschedule your appointment.   If you are experiencing a Mental Health or Behavioral Health Crisis or need someone to talk to, please call the Suicide and Crisis Lifeline: 988 call the [place] National Suicide Prevention  Lifeline: 773-702-8549 or TTY: (860) 327-7426 TTY 409-047-9510) to talk to a trained counselor call 1-800-273-TALK (toll free, 24 hour hotline) call the [facility]: 215 249 2626 call 911   Patient verbalizes understanding of instructions and care plan provided today and agrees to view in MyChart. Active MyChart status and patient understanding of how to access instructions and care plan via MyChart confirmed with patient.     The patient has been provided with contact information for the care management team and has been advised to call with any health related questions or concerns.    L. Noelle Penner, RN, BSN, CCM [facility] Care Management Community Coordinator Office number [phone]

## 2023-05-08 ENCOUNTER — Ambulatory Visit: Payer: Self-pay | Admitting: *Deleted

## 2023-05-08 DIAGNOSIS — I129 Hypertensive chronic kidney disease with stage 1 through stage 4 chronic kidney disease, or unspecified chronic kidney disease: Secondary | ICD-10-CM | POA: Diagnosis not present

## 2023-05-08 DIAGNOSIS — F320 Major depressive disorder, single episode, mild: Secondary | ICD-10-CM | POA: Diagnosis not present

## 2023-05-08 DIAGNOSIS — N184 Chronic kidney disease, stage 4 (severe): Secondary | ICD-10-CM | POA: Diagnosis not present

## 2023-05-08 DIAGNOSIS — G35 Multiple sclerosis: Secondary | ICD-10-CM | POA: Diagnosis not present

## 2023-05-08 DIAGNOSIS — S91001D Unspecified open wound, right ankle, subsequent encounter: Secondary | ICD-10-CM | POA: Diagnosis not present

## 2023-05-08 DIAGNOSIS — L89313 Pressure ulcer of right buttock, stage 3: Secondary | ICD-10-CM | POA: Diagnosis not present

## 2023-05-08 NOTE — Patient Outreach (Signed)
  Care Coordination   Follow Up Visit Note   05/08/2023 Name: Jordan Ward MRN: [id number] DOB: 26-Apr-1941  Jordan Ward is a 82 y.o. year old male who sees Margo Aye, Kathleene Hazel, MD for primary care. I spoke with Marvia Pickles, wife of  Jordan Ward by phone today.  What matters to the patients health and wellness today?  Need new medical issues other than his wound  Home health for 2 months [facility] has been visiting to care for his wound which is healing better Suprapubic care/urine retention- managed by urology No longer receiving upstream RN CM visits  Now seen by Eboni at pcp office - Updated in [facility] Spoke with Mrs Naylor briefly as she was in the grocery store Agrees to further outreaches and will call RN CM for care coordination concerns between quarterly THN outreaches    Goals Addressed               This Visit's Progress     Patient Stated     improve mobility, wound(THN) (pt-stated)   On track     Interventions Today    Flowsheet Row Most Recent Value  Chronic Disease   Chronic disease during today's visit Other, Hypertension (HTN)  [wound, urology care, home health]  General Interventions   General Interventions Discussed/Reviewed General Interventions Reviewed, [facility], Doctor Visits  [confirmed home health ([facility]) continues visits for wound care Wound healing. Upstream services concluded per wife agreed for continued [facility] services]  Doctor Visits Discussed/Reviewed Doctor Visits Reviewed, PCP, Specialist  PCP/Specialist Visits Compliance with follow-up visit  Education Interventions   Education Provided Provided Education  Provided Verbal Education On Medication, Community Resources, Mental Health/Coping with Illness  Mental Health Interventions   Mental Health Discussed/Reviewed Mental Health Reviewed, Coping Strategies  Nutrition Interventions   Nutrition Discussed/Reviewed Nutrition Discussed, Fluid intake  Pharmacy Interventions   Pharmacy Dicussed/Reviewed  Pharmacy Topics Reviewed, Affording Medications              SDOH assessments and interventions completed:  No     Care Coordination Interventions:  Yes, provided   Follow up plan: Follow up call scheduled for 07/08/23    Encounter Outcome:  Pt. Visit Completed    L. Noelle Penner, RN, BSN, CCM [facility] Care Management Community Coordinator Office number [phone]

## 2023-05-08 NOTE — Patient Instructions (Signed)
Visit Information  Thank you for taking time to visit with me today. Please don't hesitate to contact me if I can be of assistance to you.   Following are the goals we discussed today:   Goals Addressed               This Visit's Progress     Patient Stated     improve mobility, wound(THN) (pt-stated)   On track     Interventions Today    Flowsheet Row Most Recent Value  Chronic Disease   Chronic disease during today's visit Other, Hypertension (HTN)  [wound, urology care, home health]  General Interventions   General Interventions Discussed/Reviewed General Interventions Reviewed, [facility], Doctor Visits  [confirmed home health ([facility]) continues visits for wound care Wound healing. Upstream services concluded per wife agreed for continued [facility] services]  Doctor Visits Discussed/Reviewed Doctor Visits Reviewed, PCP, Specialist  PCP/Specialist Visits Compliance with follow-up visit  Education Interventions   Education Provided Provided Education  Provided Verbal Education On Medication, Community Resources, Mental Health/Coping with Illness  Mental Health Interventions   Mental Health Discussed/Reviewed Mental Health Reviewed, Coping Strategies  Nutrition Interventions   Nutrition Discussed/Reviewed Nutrition Discussed, Fluid intake  Pharmacy Interventions   Pharmacy Dicussed/Reviewed Pharmacy Topics Reviewed, Affording Medications              Our next appointment is by telephone on 07/08/23 at 2:30 pm  Please call the care guide team at [phone] if you need to cancel or reschedule your appointment.   If you are experiencing a Mental Health or Behavioral Health Crisis or need someone to talk to, please call the Suicide and Crisis Lifeline: 988 call the [place] National Suicide Prevention Lifeline: 6300293996 or TTY: 564-690-2127 TTY (986) 645-3682) to talk to a trained counselor call 1-800-273-TALK (toll free, 24 hour hotline) call the [facility]: (470)003-5212 call 911   Patient verbalizes understanding of instructions and care plan provided today and agrees to view in MyChart. Active MyChart status and patient understanding of how to access instructions and care plan via MyChart confirmed with patient.     The patient has been provided with contact information for the care management team and has been advised to call with any health related questions or concerns.     L. Noelle Penner, RN, BSN, CCM [facility] Care Management Community Coordinator Office number [phone]

## 2023-05-12 ENCOUNTER — Ambulatory Visit (INDEPENDENT_AMBULATORY_CARE_PROVIDER_SITE_OTHER): Payer: Medicare Other

## 2023-05-12 DIAGNOSIS — J449 Chronic obstructive pulmonary disease, unspecified: Secondary | ICD-10-CM | POA: Diagnosis not present

## 2023-05-12 DIAGNOSIS — G35 Multiple sclerosis: Secondary | ICD-10-CM | POA: Diagnosis not present

## 2023-05-12 DIAGNOSIS — R339 Retention of urine, unspecified: Secondary | ICD-10-CM | POA: Diagnosis not present

## 2023-05-12 DIAGNOSIS — F320 Major depressive disorder, single episode, mild: Secondary | ICD-10-CM | POA: Diagnosis not present

## 2023-05-12 DIAGNOSIS — E039 Hypothyroidism, unspecified: Secondary | ICD-10-CM | POA: Diagnosis not present

## 2023-05-12 DIAGNOSIS — M545 Low back pain, unspecified: Secondary | ICD-10-CM | POA: Diagnosis not present

## 2023-05-12 DIAGNOSIS — I739 Peripheral vascular disease, unspecified: Secondary | ICD-10-CM | POA: Diagnosis not present

## 2023-05-12 DIAGNOSIS — R7303 Prediabetes: Secondary | ICD-10-CM | POA: Diagnosis not present

## 2023-05-12 DIAGNOSIS — I129 Hypertensive chronic kidney disease with stage 1 through stage 4 chronic kidney disease, or unspecified chronic kidney disease: Secondary | ICD-10-CM | POA: Diagnosis not present

## 2023-05-12 DIAGNOSIS — Z9981 Dependence on supplemental oxygen: Secondary | ICD-10-CM | POA: Diagnosis not present

## 2023-05-12 DIAGNOSIS — G894 Chronic pain syndrome: Secondary | ICD-10-CM | POA: Diagnosis not present

## 2023-05-12 DIAGNOSIS — Z960 Presence of urogenital implants: Secondary | ICD-10-CM | POA: Diagnosis not present

## 2023-05-12 DIAGNOSIS — N319 Neuromuscular dysfunction of bladder, unspecified: Secondary | ICD-10-CM | POA: Diagnosis not present

## 2023-05-12 DIAGNOSIS — R1310 Dysphagia, unspecified: Secondary | ICD-10-CM | POA: Diagnosis not present

## 2023-05-12 DIAGNOSIS — J069 Acute upper respiratory infection, unspecified: Secondary | ICD-10-CM | POA: Diagnosis not present

## 2023-05-12 DIAGNOSIS — S91001D Unspecified open wound, right ankle, subsequent encounter: Secondary | ICD-10-CM | POA: Diagnosis not present

## 2023-05-12 DIAGNOSIS — L89313 Pressure ulcer of right buttock, stage 3: Secondary | ICD-10-CM | POA: Diagnosis not present

## 2023-05-12 DIAGNOSIS — E0590 Thyrotoxicosis, unspecified without thyrotoxic crisis or storm: Secondary | ICD-10-CM | POA: Diagnosis not present

## 2023-05-12 DIAGNOSIS — F411 Generalized anxiety disorder: Secondary | ICD-10-CM | POA: Diagnosis not present

## 2023-05-12 DIAGNOSIS — N184 Chronic kidney disease, stage 4 (severe): Secondary | ICD-10-CM | POA: Diagnosis not present

## 2023-05-12 NOTE — Progress Notes (Addendum)
Suprapubic Cath Change  Patient is present today for a suprapubic catheter change due to urinary retention.  10ml of water was drained from the balloon, a 24FR foley cath was removed from the tract with out difficulty.  Site was cleaned and prepped in a sterile fashion with betadine.  A 24FR foley cath was replaced into the tract no complications were noted. Urine return was noted, 10 ml of sterile water was inflated into the balloon and a leg bag was attached for drainage.  Patient tolerated well. A night bag was given to patient and proper instruction was given on how to switch bags.    Performed by: Guss Bunde, CMA  Follow up: Follow up as scheduled.

## 2023-05-13 DIAGNOSIS — F320 Major depressive disorder, single episode, mild: Secondary | ICD-10-CM | POA: Diagnosis not present

## 2023-05-13 DIAGNOSIS — S91001D Unspecified open wound, right ankle, subsequent encounter: Secondary | ICD-10-CM | POA: Diagnosis not present

## 2023-05-13 DIAGNOSIS — N184 Chronic kidney disease, stage 4 (severe): Secondary | ICD-10-CM | POA: Diagnosis not present

## 2023-05-13 DIAGNOSIS — I129 Hypertensive chronic kidney disease with stage 1 through stage 4 chronic kidney disease, or unspecified chronic kidney disease: Secondary | ICD-10-CM | POA: Diagnosis not present

## 2023-05-13 DIAGNOSIS — L89313 Pressure ulcer of right buttock, stage 3: Secondary | ICD-10-CM | POA: Diagnosis not present

## 2023-05-13 DIAGNOSIS — G35 Multiple sclerosis: Secondary | ICD-10-CM | POA: Diagnosis not present

## 2023-05-14 DIAGNOSIS — I129 Hypertensive chronic kidney disease with stage 1 through stage 4 chronic kidney disease, or unspecified chronic kidney disease: Secondary | ICD-10-CM | POA: Diagnosis not present

## 2023-05-14 DIAGNOSIS — L89313 Pressure ulcer of right buttock, stage 3: Secondary | ICD-10-CM | POA: Diagnosis not present

## 2023-05-14 DIAGNOSIS — G35 Multiple sclerosis: Secondary | ICD-10-CM | POA: Diagnosis not present

## 2023-05-14 DIAGNOSIS — F320 Major depressive disorder, single episode, mild: Secondary | ICD-10-CM | POA: Diagnosis not present

## 2023-05-14 DIAGNOSIS — N184 Chronic kidney disease, stage 4 (severe): Secondary | ICD-10-CM | POA: Diagnosis not present

## 2023-05-14 DIAGNOSIS — S91001D Unspecified open wound, right ankle, subsequent encounter: Secondary | ICD-10-CM | POA: Diagnosis not present

## 2023-05-18 DIAGNOSIS — I129 Hypertensive chronic kidney disease with stage 1 through stage 4 chronic kidney disease, or unspecified chronic kidney disease: Secondary | ICD-10-CM | POA: Diagnosis not present

## 2023-05-18 DIAGNOSIS — L97319 Non-pressure chronic ulcer of right ankle with unspecified severity: Secondary | ICD-10-CM | POA: Diagnosis not present

## 2023-05-18 DIAGNOSIS — G35 Multiple sclerosis: Secondary | ICD-10-CM | POA: Diagnosis not present

## 2023-05-18 DIAGNOSIS — L98499 Non-pressure chronic ulcer of skin of other sites with unspecified severity: Secondary | ICD-10-CM | POA: Diagnosis not present

## 2023-05-18 DIAGNOSIS — L89313 Pressure ulcer of right buttock, stage 3: Secondary | ICD-10-CM | POA: Diagnosis not present

## 2023-05-18 DIAGNOSIS — N184 Chronic kidney disease, stage 4 (severe): Secondary | ICD-10-CM | POA: Diagnosis not present

## 2023-05-18 DIAGNOSIS — S91001D Unspecified open wound, right ankle, subsequent encounter: Secondary | ICD-10-CM | POA: Diagnosis not present

## 2023-05-18 DIAGNOSIS — F320 Major depressive disorder, single episode, mild: Secondary | ICD-10-CM | POA: Diagnosis not present

## 2023-05-19 DIAGNOSIS — L89313 Pressure ulcer of right buttock, stage 3: Secondary | ICD-10-CM | POA: Diagnosis not present

## 2023-05-19 DIAGNOSIS — N184 Chronic kidney disease, stage 4 (severe): Secondary | ICD-10-CM | POA: Diagnosis not present

## 2023-05-19 DIAGNOSIS — G35 Multiple sclerosis: Secondary | ICD-10-CM | POA: Diagnosis not present

## 2023-05-19 DIAGNOSIS — I129 Hypertensive chronic kidney disease with stage 1 through stage 4 chronic kidney disease, or unspecified chronic kidney disease: Secondary | ICD-10-CM | POA: Diagnosis not present

## 2023-05-19 DIAGNOSIS — F320 Major depressive disorder, single episode, mild: Secondary | ICD-10-CM | POA: Diagnosis not present

## 2023-05-19 DIAGNOSIS — S91001D Unspecified open wound, right ankle, subsequent encounter: Secondary | ICD-10-CM | POA: Diagnosis not present

## 2023-05-21 DIAGNOSIS — N184 Chronic kidney disease, stage 4 (severe): Secondary | ICD-10-CM | POA: Diagnosis not present

## 2023-05-21 DIAGNOSIS — G35 Multiple sclerosis: Secondary | ICD-10-CM | POA: Diagnosis not present

## 2023-05-21 DIAGNOSIS — L89313 Pressure ulcer of right buttock, stage 3: Secondary | ICD-10-CM | POA: Diagnosis not present

## 2023-05-21 DIAGNOSIS — I129 Hypertensive chronic kidney disease with stage 1 through stage 4 chronic kidney disease, or unspecified chronic kidney disease: Secondary | ICD-10-CM | POA: Diagnosis not present

## 2023-05-21 DIAGNOSIS — F320 Major depressive disorder, single episode, mild: Secondary | ICD-10-CM | POA: Diagnosis not present

## 2023-05-21 DIAGNOSIS — S91001D Unspecified open wound, right ankle, subsequent encounter: Secondary | ICD-10-CM | POA: Diagnosis not present

## 2023-05-25 DIAGNOSIS — S91001D Unspecified open wound, right ankle, subsequent encounter: Secondary | ICD-10-CM | POA: Diagnosis not present

## 2023-05-25 DIAGNOSIS — I129 Hypertensive chronic kidney disease with stage 1 through stage 4 chronic kidney disease, or unspecified chronic kidney disease: Secondary | ICD-10-CM | POA: Diagnosis not present

## 2023-05-25 DIAGNOSIS — F320 Major depressive disorder, single episode, mild: Secondary | ICD-10-CM | POA: Diagnosis not present

## 2023-05-25 DIAGNOSIS — L89313 Pressure ulcer of right buttock, stage 3: Secondary | ICD-10-CM | POA: Diagnosis not present

## 2023-05-25 DIAGNOSIS — N184 Chronic kidney disease, stage 4 (severe): Secondary | ICD-10-CM | POA: Diagnosis not present

## 2023-05-25 DIAGNOSIS — G35 Multiple sclerosis: Secondary | ICD-10-CM | POA: Diagnosis not present

## 2023-05-26 ENCOUNTER — Other Ambulatory Visit (HOSPITAL_COMMUNITY): Payer: Self-pay | Admitting: Interventional Radiology

## 2023-05-26 ENCOUNTER — Ambulatory Visit (HOSPITAL_COMMUNITY)
Admission: RE | Admit: 2023-05-26 | Discharge: 2023-05-26 | Disposition: A | Discharge status: Home / Self Care | Payer: Medicare Other | Source: Ambulatory Visit | Attending: Interventional Radiology | Admitting: Interventional Radiology

## 2023-05-26 DIAGNOSIS — N319 Neuromuscular dysfunction of bladder, unspecified: Secondary | ICD-10-CM

## 2023-05-26 DIAGNOSIS — Z436 Encounter for attention to other artificial openings of urinary tract: Secondary | ICD-10-CM | POA: Insufficient documentation

## 2023-05-26 HISTORY — PX: IR NEPHROSTOMY EXCHANGE LEFT: IMG6069

## 2023-05-26 MED ORDER — IOHEXOL 300 MG/ML  SOLN
50.0000 mL | Freq: Once | INTRAMUSCULAR | Status: AC | PRN
  Administered 2023-05-26: 15 mL

## 2023-05-26 MED ORDER — LIDOCAINE HCL 1 % IJ SOLN
INTRAMUSCULAR | Status: AC
  Filled 2023-05-26: qty 20

## 2023-05-26 MED ORDER — LIDOCAINE HCL (PF) 1 % IJ SOLN: 20.0000 mL | Freq: Once | INTRAMUSCULAR | Status: DC

## 2023-05-28 DIAGNOSIS — S91001D Unspecified open wound, right ankle, subsequent encounter: Secondary | ICD-10-CM | POA: Diagnosis not present

## 2023-05-28 DIAGNOSIS — L89313 Pressure ulcer of right buttock, stage 3: Secondary | ICD-10-CM | POA: Diagnosis not present

## 2023-05-28 DIAGNOSIS — I129 Hypertensive chronic kidney disease with stage 1 through stage 4 chronic kidney disease, or unspecified chronic kidney disease: Secondary | ICD-10-CM | POA: Diagnosis not present

## 2023-05-28 DIAGNOSIS — F320 Major depressive disorder, single episode, mild: Secondary | ICD-10-CM | POA: Diagnosis not present

## 2023-05-28 DIAGNOSIS — N184 Chronic kidney disease, stage 4 (severe): Secondary | ICD-10-CM | POA: Diagnosis not present

## 2023-05-28 DIAGNOSIS — G35 Multiple sclerosis: Secondary | ICD-10-CM | POA: Diagnosis not present

## 2023-06-02 DIAGNOSIS — L89313 Pressure ulcer of right buttock, stage 3: Secondary | ICD-10-CM | POA: Diagnosis not present

## 2023-06-02 DIAGNOSIS — N184 Chronic kidney disease, stage 4 (severe): Secondary | ICD-10-CM | POA: Diagnosis not present

## 2023-06-02 DIAGNOSIS — F320 Major depressive disorder, single episode, mild: Secondary | ICD-10-CM | POA: Diagnosis not present

## 2023-06-02 DIAGNOSIS — I129 Hypertensive chronic kidney disease with stage 1 through stage 4 chronic kidney disease, or unspecified chronic kidney disease: Secondary | ICD-10-CM | POA: Diagnosis not present

## 2023-06-02 DIAGNOSIS — G35 Multiple sclerosis: Secondary | ICD-10-CM | POA: Diagnosis not present

## 2023-06-02 DIAGNOSIS — S91001D Unspecified open wound, right ankle, subsequent encounter: Secondary | ICD-10-CM | POA: Diagnosis not present

## 2023-06-04 DIAGNOSIS — L89313 Pressure ulcer of right buttock, stage 3: Secondary | ICD-10-CM | POA: Diagnosis not present

## 2023-06-04 DIAGNOSIS — I129 Hypertensive chronic kidney disease with stage 1 through stage 4 chronic kidney disease, or unspecified chronic kidney disease: Secondary | ICD-10-CM | POA: Diagnosis not present

## 2023-06-04 DIAGNOSIS — F320 Major depressive disorder, single episode, mild: Secondary | ICD-10-CM | POA: Diagnosis not present

## 2023-06-04 DIAGNOSIS — N184 Chronic kidney disease, stage 4 (severe): Secondary | ICD-10-CM | POA: Diagnosis not present

## 2023-06-04 DIAGNOSIS — S91001D Unspecified open wound, right ankle, subsequent encounter: Secondary | ICD-10-CM | POA: Diagnosis not present

## 2023-06-04 DIAGNOSIS — G35 Multiple sclerosis: Secondary | ICD-10-CM | POA: Diagnosis not present

## 2023-06-08 DIAGNOSIS — G35 Multiple sclerosis: Secondary | ICD-10-CM | POA: Diagnosis not present

## 2023-06-08 DIAGNOSIS — N184 Chronic kidney disease, stage 4 (severe): Secondary | ICD-10-CM | POA: Diagnosis not present

## 2023-06-08 DIAGNOSIS — L89313 Pressure ulcer of right buttock, stage 3: Secondary | ICD-10-CM | POA: Diagnosis not present

## 2023-06-08 DIAGNOSIS — I129 Hypertensive chronic kidney disease with stage 1 through stage 4 chronic kidney disease, or unspecified chronic kidney disease: Secondary | ICD-10-CM | POA: Diagnosis not present

## 2023-06-08 DIAGNOSIS — F320 Major depressive disorder, single episode, mild: Secondary | ICD-10-CM | POA: Diagnosis not present

## 2023-06-08 DIAGNOSIS — S91001D Unspecified open wound, right ankle, subsequent encounter: Secondary | ICD-10-CM | POA: Diagnosis not present

## 2023-06-10 DIAGNOSIS — N184 Chronic kidney disease, stage 4 (severe): Secondary | ICD-10-CM | POA: Diagnosis not present

## 2023-06-10 DIAGNOSIS — F320 Major depressive disorder, single episode, mild: Secondary | ICD-10-CM | POA: Diagnosis not present

## 2023-06-10 DIAGNOSIS — L89313 Pressure ulcer of right buttock, stage 3: Secondary | ICD-10-CM | POA: Diagnosis not present

## 2023-06-10 DIAGNOSIS — S91001D Unspecified open wound, right ankle, subsequent encounter: Secondary | ICD-10-CM | POA: Diagnosis not present

## 2023-06-10 DIAGNOSIS — G35 Multiple sclerosis: Secondary | ICD-10-CM | POA: Diagnosis not present

## 2023-06-10 DIAGNOSIS — I129 Hypertensive chronic kidney disease with stage 1 through stage 4 chronic kidney disease, or unspecified chronic kidney disease: Secondary | ICD-10-CM | POA: Diagnosis not present

## 2023-06-11 ENCOUNTER — Ambulatory Visit (INDEPENDENT_AMBULATORY_CARE_PROVIDER_SITE_OTHER): Payer: Medicare Other

## 2023-06-11 DIAGNOSIS — F320 Major depressive disorder, single episode, mild: Secondary | ICD-10-CM | POA: Diagnosis not present

## 2023-06-11 DIAGNOSIS — G35 Multiple sclerosis: Secondary | ICD-10-CM | POA: Diagnosis not present

## 2023-06-11 DIAGNOSIS — L89313 Pressure ulcer of right buttock, stage 3: Secondary | ICD-10-CM | POA: Diagnosis not present

## 2023-06-11 DIAGNOSIS — R339 Retention of urine, unspecified: Secondary | ICD-10-CM

## 2023-06-11 DIAGNOSIS — Z960 Presence of urogenital implants: Secondary | ICD-10-CM | POA: Diagnosis not present

## 2023-06-11 DIAGNOSIS — E039 Hypothyroidism, unspecified: Secondary | ICD-10-CM | POA: Diagnosis not present

## 2023-06-11 DIAGNOSIS — S91001D Unspecified open wound, right ankle, subsequent encounter: Secondary | ICD-10-CM | POA: Diagnosis not present

## 2023-06-11 DIAGNOSIS — N184 Chronic kidney disease, stage 4 (severe): Secondary | ICD-10-CM | POA: Diagnosis not present

## 2023-06-11 DIAGNOSIS — J449 Chronic obstructive pulmonary disease, unspecified: Secondary | ICD-10-CM | POA: Diagnosis not present

## 2023-06-11 DIAGNOSIS — F411 Generalized anxiety disorder: Secondary | ICD-10-CM | POA: Diagnosis not present

## 2023-06-11 DIAGNOSIS — J069 Acute upper respiratory infection, unspecified: Secondary | ICD-10-CM | POA: Diagnosis not present

## 2023-06-11 DIAGNOSIS — M545 Low back pain, unspecified: Secondary | ICD-10-CM | POA: Diagnosis not present

## 2023-06-11 DIAGNOSIS — I129 Hypertensive chronic kidney disease with stage 1 through stage 4 chronic kidney disease, or unspecified chronic kidney disease: Secondary | ICD-10-CM | POA: Diagnosis not present

## 2023-06-11 DIAGNOSIS — G894 Chronic pain syndrome: Secondary | ICD-10-CM | POA: Diagnosis not present

## 2023-06-11 DIAGNOSIS — I739 Peripheral vascular disease, unspecified: Secondary | ICD-10-CM | POA: Diagnosis not present

## 2023-06-11 DIAGNOSIS — Z9981 Dependence on supplemental oxygen: Secondary | ICD-10-CM | POA: Diagnosis not present

## 2023-06-11 DIAGNOSIS — R7303 Prediabetes: Secondary | ICD-10-CM | POA: Diagnosis not present

## 2023-06-11 DIAGNOSIS — E0590 Thyrotoxicosis, unspecified without thyrotoxic crisis or storm: Secondary | ICD-10-CM | POA: Diagnosis not present

## 2023-06-11 DIAGNOSIS — N319 Neuromuscular dysfunction of bladder, unspecified: Secondary | ICD-10-CM | POA: Diagnosis not present

## 2023-06-11 DIAGNOSIS — R1310 Dysphagia, unspecified: Secondary | ICD-10-CM | POA: Diagnosis not present

## 2023-06-11 NOTE — Progress Notes (Signed)
Suprapubic Cath Change  Patient is present today for a suprapubic catheter change due to urinary retention.  10ml of water was drained from the balloon, a 24FR foley cath was removed from the tract with out difficulty.  Site was cleaned and prepped in a sterile fashion with betadine.  A 24FR foley cath was replaced into the tract no complications were noted. Flush return was noted, 10 ml of sterile water was inflated into the balloon and a leg bag was attached for drainage.  Patient tolerated well. A night bag was given to patient and proper instruction was given on how to switch bags.    Performed by: Kennyth Lose, CMA  Follow up: No follow-ups on file.

## 2023-06-15 ENCOUNTER — Telehealth: Payer: Self-pay

## 2023-06-15 ENCOUNTER — Ambulatory Visit (INDEPENDENT_AMBULATORY_CARE_PROVIDER_SITE_OTHER): Payer: Medicare Other

## 2023-06-15 DIAGNOSIS — N139 Obstructive and reflux uropathy, unspecified: Secondary | ICD-10-CM | POA: Diagnosis not present

## 2023-06-15 DIAGNOSIS — I129 Hypertensive chronic kidney disease with stage 1 through stage 4 chronic kidney disease, or unspecified chronic kidney disease: Secondary | ICD-10-CM | POA: Diagnosis not present

## 2023-06-15 DIAGNOSIS — G459 Transient cerebral ischemic attack, unspecified: Secondary | ICD-10-CM | POA: Diagnosis not present

## 2023-06-15 DIAGNOSIS — N1330 Unspecified hydronephrosis: Secondary | ICD-10-CM

## 2023-06-15 DIAGNOSIS — N261 Atrophy of kidney (terminal): Secondary | ICD-10-CM | POA: Diagnosis not present

## 2023-06-15 DIAGNOSIS — N184 Chronic kidney disease, stage 4 (severe): Secondary | ICD-10-CM | POA: Diagnosis not present

## 2023-06-15 DIAGNOSIS — N179 Acute kidney failure, unspecified: Secondary | ICD-10-CM | POA: Diagnosis not present

## 2023-06-15 DIAGNOSIS — G894 Chronic pain syndrome: Secondary | ICD-10-CM | POA: Diagnosis not present

## 2023-06-15 DIAGNOSIS — D631 Anemia in chronic kidney disease: Secondary | ICD-10-CM | POA: Diagnosis not present

## 2023-06-15 DIAGNOSIS — R339 Retention of urine, unspecified: Secondary | ICD-10-CM | POA: Diagnosis not present

## 2023-06-15 DIAGNOSIS — R7303 Prediabetes: Secondary | ICD-10-CM | POA: Diagnosis not present

## 2023-06-15 DIAGNOSIS — G35 Multiple sclerosis: Secondary | ICD-10-CM | POA: Diagnosis not present

## 2023-06-15 DIAGNOSIS — I2510 Atherosclerotic heart disease of native coronary artery without angina pectoris: Secondary | ICD-10-CM | POA: Diagnosis not present

## 2023-06-15 DIAGNOSIS — R1084 Generalized abdominal pain: Secondary | ICD-10-CM

## 2023-06-15 NOTE — Telephone Encounter (Signed)
Patient came in office today with complains to pain in his penis, he feels that he may be passing a stone.  Patient denies any other symptoms at this time, no fever.  I informed Dr. Retta Diones, he does not think that he is passing a stone since he has a nephrostomy tube.  He gave verbal orders to order a non contrast CT.  Imaging ordered and patient aware of MD response at this time.

## 2023-06-15 NOTE — Progress Notes (Signed)
Suprapubic Cath Change  Patient is present today for a suprapubic catheter change due to urinary retention.  10ml of water was drained from the balloon, a 24FR foley cath was removed from the tract with out difficulty.  Site was cleaned and prepped in a sterile fashion with betadine.  A 24FR foley cath was replaced into the tract no complications were noted. Catheter was flushed with 30cc of sterile water with return noted, 10 ml of sterile water was inflated into the balloon and a leg bag was attached for drainage.  Patient tolerated well. A night bag was given to patient and proper instruction was given on how to switch bags.    Performed by: Guss Bunde, CMA  Follow up:Follow up as scheduled.

## 2023-06-16 DIAGNOSIS — I129 Hypertensive chronic kidney disease with stage 1 through stage 4 chronic kidney disease, or unspecified chronic kidney disease: Secondary | ICD-10-CM | POA: Diagnosis not present

## 2023-06-16 DIAGNOSIS — N184 Chronic kidney disease, stage 4 (severe): Secondary | ICD-10-CM | POA: Diagnosis not present

## 2023-06-16 DIAGNOSIS — F320 Major depressive disorder, single episode, mild: Secondary | ICD-10-CM | POA: Diagnosis not present

## 2023-06-16 DIAGNOSIS — L89313 Pressure ulcer of right buttock, stage 3: Secondary | ICD-10-CM | POA: Diagnosis not present

## 2023-06-16 DIAGNOSIS — G35 Multiple sclerosis: Secondary | ICD-10-CM | POA: Diagnosis not present

## 2023-06-16 DIAGNOSIS — S91001D Unspecified open wound, right ankle, subsequent encounter: Secondary | ICD-10-CM | POA: Diagnosis not present

## 2023-06-19 DIAGNOSIS — N184 Chronic kidney disease, stage 4 (severe): Secondary | ICD-10-CM | POA: Diagnosis not present

## 2023-06-19 DIAGNOSIS — N390 Urinary tract infection, site not specified: Secondary | ICD-10-CM | POA: Diagnosis not present

## 2023-06-22 ENCOUNTER — Telehealth: Payer: Self-pay | Admitting: Neurology

## 2023-06-22 ENCOUNTER — Ambulatory Visit: Payer: Medicare Other | Admitting: Diagnostic Neuroimaging

## 2023-06-22 NOTE — Telephone Encounter (Signed)
Pt's wife called to cancel appt due to transportation issues. Rescheduled appt 12/28/23, added to the waitlist. Jordan Ward would like phone if earlier appt becomes available.

## 2023-06-23 DIAGNOSIS — N184 Chronic kidney disease, stage 4 (severe): Secondary | ICD-10-CM | POA: Diagnosis not present

## 2023-06-23 DIAGNOSIS — F320 Major depressive disorder, single episode, mild: Secondary | ICD-10-CM | POA: Diagnosis not present

## 2023-06-23 DIAGNOSIS — G35 Multiple sclerosis: Secondary | ICD-10-CM | POA: Diagnosis not present

## 2023-06-23 DIAGNOSIS — S91001D Unspecified open wound, right ankle, subsequent encounter: Secondary | ICD-10-CM | POA: Diagnosis not present

## 2023-06-23 DIAGNOSIS — L89313 Pressure ulcer of right buttock, stage 3: Secondary | ICD-10-CM | POA: Diagnosis not present

## 2023-06-23 DIAGNOSIS — I129 Hypertensive chronic kidney disease with stage 1 through stage 4 chronic kidney disease, or unspecified chronic kidney disease: Secondary | ICD-10-CM | POA: Diagnosis not present

## 2023-06-24 ENCOUNTER — Ambulatory Visit: Payer: Medicare Other | Admitting: Diagnostic Neuroimaging

## 2023-06-25 ENCOUNTER — Ambulatory Visit: Payer: Medicare Other | Admitting: Diagnostic Neuroimaging

## 2023-06-30 NOTE — Progress Notes (Signed)
This encounter was created in error - please disregard.

## 2023-07-01 ENCOUNTER — Ambulatory Visit (HOSPITAL_COMMUNITY)
Admission: RE | Admit: 2023-07-01 | Discharge: 2023-07-01 | Disposition: A | Discharge status: Home / Self Care | Payer: Medicare Other | Source: Ambulatory Visit | Attending: Urology | Admitting: Urology

## 2023-07-01 DIAGNOSIS — N281 Cyst of kidney, acquired: Secondary | ICD-10-CM | POA: Diagnosis not present

## 2023-07-01 DIAGNOSIS — I129 Hypertensive chronic kidney disease with stage 1 through stage 4 chronic kidney disease, or unspecified chronic kidney disease: Secondary | ICD-10-CM | POA: Diagnosis not present

## 2023-07-01 DIAGNOSIS — R1084 Generalized abdominal pain: Secondary | ICD-10-CM | POA: Insufficient documentation

## 2023-07-01 DIAGNOSIS — N1330 Unspecified hydronephrosis: Secondary | ICD-10-CM | POA: Diagnosis not present

## 2023-07-01 DIAGNOSIS — R109 Unspecified abdominal pain: Secondary | ICD-10-CM | POA: Diagnosis not present

## 2023-07-01 DIAGNOSIS — N184 Chronic kidney disease, stage 4 (severe): Secondary | ICD-10-CM | POA: Diagnosis not present

## 2023-07-01 DIAGNOSIS — L89313 Pressure ulcer of right buttock, stage 3: Secondary | ICD-10-CM | POA: Diagnosis not present

## 2023-07-01 DIAGNOSIS — N200 Calculus of kidney: Secondary | ICD-10-CM | POA: Diagnosis not present

## 2023-07-01 DIAGNOSIS — F320 Major depressive disorder, single episode, mild: Secondary | ICD-10-CM | POA: Diagnosis not present

## 2023-07-01 DIAGNOSIS — G35 Multiple sclerosis: Secondary | ICD-10-CM | POA: Diagnosis not present

## 2023-07-01 DIAGNOSIS — S91001D Unspecified open wound, right ankle, subsequent encounter: Secondary | ICD-10-CM | POA: Diagnosis not present

## 2023-07-08 ENCOUNTER — Ambulatory Visit: Payer: Self-pay | Admitting: *Deleted

## 2023-07-08 DIAGNOSIS — F320 Major depressive disorder, single episode, mild: Secondary | ICD-10-CM | POA: Diagnosis not present

## 2023-07-08 DIAGNOSIS — L89313 Pressure ulcer of right buttock, stage 3: Secondary | ICD-10-CM | POA: Diagnosis not present

## 2023-07-08 DIAGNOSIS — N184 Chronic kidney disease, stage 4 (severe): Secondary | ICD-10-CM | POA: Diagnosis not present

## 2023-07-08 DIAGNOSIS — I129 Hypertensive chronic kidney disease with stage 1 through stage 4 chronic kidney disease, or unspecified chronic kidney disease: Secondary | ICD-10-CM | POA: Diagnosis not present

## 2023-07-08 DIAGNOSIS — S91001D Unspecified open wound, right ankle, subsequent encounter: Secondary | ICD-10-CM | POA: Diagnosis not present

## 2023-07-08 DIAGNOSIS — G35 Multiple sclerosis: Secondary | ICD-10-CM | POA: Diagnosis not present

## 2023-07-08 NOTE — Patient Outreach (Incomplete)
Care Coordination   Follow Up Visit Note   07/08/2023 Name: Jordan Ward MRN: [id number] DOB: 1941/04/23  Jordan Ward is a 82 y.o. year old male who sees Margo Aye, Kathleene Hazel, MD for primary care. I spoke with Lupita Leash, wife of  Jordan Ward by phone today.  What matters to the patients health and wellness today?  Lupita Leash reports Jordan Ward is doing well. She denies any medical concern today. Reports about 3 weeks ago he had a cold and was seen at the pcp office.     [facility] nurse continues to complete wound care. Lupita Leash reports there is no redness nor drainage but she does not feel there has been much change as "there is still a hole" She confirms the home health nurse is to visit today    Jordan Ward, [facility] nurse, reports she was scheduled to visit on today but received a call from his wife and requested a visit on Friday 07/10/23 She also reports the last time she dressed the patient's wound "it was about the same" no real noticeable improvement about the same  She reports clear drainage,  redness around the site, with patient stating the pain about a 4 onn a scale of 1-10 with 10 being the worst pain he is able to tolerate ?betadine Friday      Goals Addressed   None     SDOH assessments and interventions completed:  No{THN Tip this will not be part of the note when signed-REQUIRED REPORT FIELD DO NOT DELETE (Optional):27901}     Care Coordination Interventions:  No, not indicated {THN Tip this will not be part of the note when signed-REQUIRED REPORT FIELD DO NOT DELETE (Optional):27901}  Follow up plan: Follow up call scheduled for ***   Encounter Outcome:  Patient Visit Completed {THN Tip this will not be part of the note when signed-REQUIRED REPORT FIELD DO NOT DELETE (Optional):27901}  Abdi Husak L. Noelle Penner, RN, BSN, [facility]  VBC[facility] Care Management Coordinator  [phone]  Fax: [phone]

## 2023-07-11 DIAGNOSIS — M545 Low back pain, unspecified: Secondary | ICD-10-CM | POA: Diagnosis not present

## 2023-07-11 DIAGNOSIS — I129 Hypertensive chronic kidney disease with stage 1 through stage 4 chronic kidney disease, or unspecified chronic kidney disease: Secondary | ICD-10-CM | POA: Diagnosis not present

## 2023-07-11 DIAGNOSIS — R1310 Dysphagia, unspecified: Secondary | ICD-10-CM | POA: Diagnosis not present

## 2023-07-11 DIAGNOSIS — J449 Chronic obstructive pulmonary disease, unspecified: Secondary | ICD-10-CM | POA: Diagnosis not present

## 2023-07-11 DIAGNOSIS — E0590 Thyrotoxicosis, unspecified without thyrotoxic crisis or storm: Secondary | ICD-10-CM | POA: Diagnosis not present

## 2023-07-11 DIAGNOSIS — G894 Chronic pain syndrome: Secondary | ICD-10-CM | POA: Diagnosis not present

## 2023-07-11 DIAGNOSIS — F411 Generalized anxiety disorder: Secondary | ICD-10-CM | POA: Diagnosis not present

## 2023-07-11 DIAGNOSIS — Z466 Encounter for fitting and adjustment of urinary device: Secondary | ICD-10-CM | POA: Diagnosis not present

## 2023-07-11 DIAGNOSIS — S91001D Unspecified open wound, right ankle, subsequent encounter: Secondary | ICD-10-CM | POA: Diagnosis not present

## 2023-07-11 DIAGNOSIS — E039 Hypothyroidism, unspecified: Secondary | ICD-10-CM | POA: Diagnosis not present

## 2023-07-11 DIAGNOSIS — R7303 Prediabetes: Secondary | ICD-10-CM | POA: Diagnosis not present

## 2023-07-11 DIAGNOSIS — N319 Neuromuscular dysfunction of bladder, unspecified: Secondary | ICD-10-CM | POA: Diagnosis not present

## 2023-07-11 DIAGNOSIS — Z9981 Dependence on supplemental oxygen: Secondary | ICD-10-CM | POA: Diagnosis not present

## 2023-07-11 DIAGNOSIS — F320 Major depressive disorder, single episode, mild: Secondary | ICD-10-CM | POA: Diagnosis not present

## 2023-07-11 DIAGNOSIS — G35 Multiple sclerosis: Secondary | ICD-10-CM | POA: Diagnosis not present

## 2023-07-11 DIAGNOSIS — N184 Chronic kidney disease, stage 4 (severe): Secondary | ICD-10-CM | POA: Diagnosis not present

## 2023-07-11 DIAGNOSIS — I739 Peripheral vascular disease, unspecified: Secondary | ICD-10-CM | POA: Diagnosis not present

## 2023-07-14 ENCOUNTER — Telehealth: Payer: Self-pay

## 2023-07-14 ENCOUNTER — Ambulatory Visit (INDEPENDENT_AMBULATORY_CARE_PROVIDER_SITE_OTHER): Payer: Medicare Other

## 2023-07-14 DIAGNOSIS — R339 Retention of urine, unspecified: Secondary | ICD-10-CM

## 2023-07-14 NOTE — Telephone Encounter (Signed)
I spoke with patient's wife, she is aware of MD response.  She will discuss this with Mr. Grieshaber and call our office back once he has made a decision.

## 2023-07-14 NOTE — Progress Notes (Signed)
Suprapubic Cath Change  Patient is present today for a suprapubic catheter change due to urinary retention.  10ml of water was drained from the balloon, a 24FR foley cath was removed from the tract with out difficulty.  Site was cleaned and prepped in a sterile fashion with betadine.  A 24FR foley cath was replaced into the tract no complications were noted. Urine return was noted, 10 ml of sterile water was inflated into the balloon and a leg bag was attached for drainage.  Patient tolerated well.   Performed by: Guss Bunde, CMA  Follow up: as scheduled.

## 2023-07-14 NOTE — Telephone Encounter (Signed)
Can you please review pt recent CT imaging.

## 2023-07-15 DIAGNOSIS — J449 Chronic obstructive pulmonary disease, unspecified: Secondary | ICD-10-CM | POA: Diagnosis not present

## 2023-07-15 DIAGNOSIS — G35 Multiple sclerosis: Secondary | ICD-10-CM | POA: Diagnosis not present

## 2023-07-15 DIAGNOSIS — S91001D Unspecified open wound, right ankle, subsequent encounter: Secondary | ICD-10-CM | POA: Diagnosis not present

## 2023-07-15 DIAGNOSIS — I129 Hypertensive chronic kidney disease with stage 1 through stage 4 chronic kidney disease, or unspecified chronic kidney disease: Secondary | ICD-10-CM | POA: Diagnosis not present

## 2023-07-15 DIAGNOSIS — F320 Major depressive disorder, single episode, mild: Secondary | ICD-10-CM | POA: Diagnosis not present

## 2023-07-15 DIAGNOSIS — I739 Peripheral vascular disease, unspecified: Secondary | ICD-10-CM | POA: Diagnosis not present

## 2023-07-17 DIAGNOSIS — F320 Major depressive disorder, single episode, mild: Secondary | ICD-10-CM | POA: Diagnosis not present

## 2023-07-17 DIAGNOSIS — J449 Chronic obstructive pulmonary disease, unspecified: Secondary | ICD-10-CM | POA: Diagnosis not present

## 2023-07-17 DIAGNOSIS — S91001D Unspecified open wound, right ankle, subsequent encounter: Secondary | ICD-10-CM | POA: Diagnosis not present

## 2023-07-17 DIAGNOSIS — I129 Hypertensive chronic kidney disease with stage 1 through stage 4 chronic kidney disease, or unspecified chronic kidney disease: Secondary | ICD-10-CM | POA: Diagnosis not present

## 2023-07-17 DIAGNOSIS — I739 Peripheral vascular disease, unspecified: Secondary | ICD-10-CM | POA: Diagnosis not present

## 2023-07-17 DIAGNOSIS — G35 Multiple sclerosis: Secondary | ICD-10-CM | POA: Diagnosis not present

## 2023-07-21 ENCOUNTER — Ambulatory Visit (HOSPITAL_COMMUNITY)
Admission: RE | Admit: 2023-07-21 | Discharge: 2023-07-21 | Disposition: A | Discharge status: Home / Self Care | Payer: Medicare Other | Source: Ambulatory Visit | Attending: Interventional Radiology | Admitting: Interventional Radiology

## 2023-07-21 ENCOUNTER — Other Ambulatory Visit (HOSPITAL_COMMUNITY): Payer: Self-pay | Admitting: Interventional Radiology

## 2023-07-21 DIAGNOSIS — Z436 Encounter for attention to other artificial openings of urinary tract: Secondary | ICD-10-CM | POA: Diagnosis not present

## 2023-07-21 DIAGNOSIS — N319 Neuromuscular dysfunction of bladder, unspecified: Secondary | ICD-10-CM | POA: Diagnosis not present

## 2023-07-21 HISTORY — PX: IR NEPHROSTOMY EXCHANGE LEFT: IMG6069

## 2023-07-21 MED ORDER — SODIUM CHLORIDE 0.9% FLUSH: 5.0000 mL | Freq: Three times a day (TID) | INTRAVENOUS | Status: DC

## 2023-07-21 MED ORDER — LIDOCAINE HCL 1 % IJ SOLN
INTRAMUSCULAR | Status: AC
  Filled 2023-07-21: qty 20

## 2023-07-21 MED ORDER — IOHEXOL 300 MG/ML  SOLN
50.0000 mL | Freq: Once | INTRAMUSCULAR | Status: AC | PRN
  Administered 2023-07-21: 10 mL

## 2023-07-21 NOTE — Procedures (Signed)
Vascular and Interventional Radiology Procedure Note  Patient: Jordan Ward DOB: January 22, 1941 Medical Record Number: [id number] Note Date/Time: 07/21/23 4:13 PM   Performing Physician: Roanna Banning, MD Assistant(s): None  Diagnosis: Routine exchange.  Procedure:  LEFT NEPHROSTOMY TUBE EXCHANGE  Anesthesia: Local Anesthetic Complications: None Estimated Blood Loss:  0 mL Specimens:  None  Findings:  Successful exchange for a 12 F nephrostomy tube into the left kidney(s).   Plan: Resume previous  care. Follow up for routine nephrostomy tube exchange in 8 week(s).   See detailed procedure note with images in PACS. The patient tolerated the procedure well without incident or complication and was returned to  Home  in stable condition.    Roanna Banning, MD [facility]   Pager. [phone] Clinic. [phone]

## 2023-07-22 DIAGNOSIS — J449 Chronic obstructive pulmonary disease, unspecified: Secondary | ICD-10-CM | POA: Diagnosis not present

## 2023-07-22 DIAGNOSIS — G35 Multiple sclerosis: Secondary | ICD-10-CM | POA: Diagnosis not present

## 2023-07-22 DIAGNOSIS — I739 Peripheral vascular disease, unspecified: Secondary | ICD-10-CM | POA: Diagnosis not present

## 2023-07-22 DIAGNOSIS — F320 Major depressive disorder, single episode, mild: Secondary | ICD-10-CM | POA: Diagnosis not present

## 2023-07-22 DIAGNOSIS — S91001D Unspecified open wound, right ankle, subsequent encounter: Secondary | ICD-10-CM | POA: Diagnosis not present

## 2023-07-22 DIAGNOSIS — I129 Hypertensive chronic kidney disease with stage 1 through stage 4 chronic kidney disease, or unspecified chronic kidney disease: Secondary | ICD-10-CM | POA: Diagnosis not present

## 2023-07-24 DIAGNOSIS — I739 Peripheral vascular disease, unspecified: Secondary | ICD-10-CM | POA: Diagnosis not present

## 2023-07-24 DIAGNOSIS — S91001D Unspecified open wound, right ankle, subsequent encounter: Secondary | ICD-10-CM | POA: Diagnosis not present

## 2023-07-24 DIAGNOSIS — F320 Major depressive disorder, single episode, mild: Secondary | ICD-10-CM | POA: Diagnosis not present

## 2023-07-24 DIAGNOSIS — G35 Multiple sclerosis: Secondary | ICD-10-CM | POA: Diagnosis not present

## 2023-07-24 DIAGNOSIS — I129 Hypertensive chronic kidney disease with stage 1 through stage 4 chronic kidney disease, or unspecified chronic kidney disease: Secondary | ICD-10-CM | POA: Diagnosis not present

## 2023-07-24 DIAGNOSIS — J449 Chronic obstructive pulmonary disease, unspecified: Secondary | ICD-10-CM | POA: Diagnosis not present

## 2023-07-28 DIAGNOSIS — I129 Hypertensive chronic kidney disease with stage 1 through stage 4 chronic kidney disease, or unspecified chronic kidney disease: Secondary | ICD-10-CM | POA: Diagnosis not present

## 2023-07-28 DIAGNOSIS — F320 Major depressive disorder, single episode, mild: Secondary | ICD-10-CM | POA: Diagnosis not present

## 2023-07-28 DIAGNOSIS — J449 Chronic obstructive pulmonary disease, unspecified: Secondary | ICD-10-CM | POA: Diagnosis not present

## 2023-07-28 DIAGNOSIS — S91001D Unspecified open wound, right ankle, subsequent encounter: Secondary | ICD-10-CM | POA: Diagnosis not present

## 2023-07-28 DIAGNOSIS — I739 Peripheral vascular disease, unspecified: Secondary | ICD-10-CM | POA: Diagnosis not present

## 2023-07-28 DIAGNOSIS — G35 Multiple sclerosis: Secondary | ICD-10-CM | POA: Diagnosis not present

## 2023-07-30 DIAGNOSIS — J449 Chronic obstructive pulmonary disease, unspecified: Secondary | ICD-10-CM | POA: Diagnosis not present

## 2023-07-30 DIAGNOSIS — G35 Multiple sclerosis: Secondary | ICD-10-CM | POA: Diagnosis not present

## 2023-07-30 DIAGNOSIS — I739 Peripheral vascular disease, unspecified: Secondary | ICD-10-CM | POA: Diagnosis not present

## 2023-07-30 DIAGNOSIS — I129 Hypertensive chronic kidney disease with stage 1 through stage 4 chronic kidney disease, or unspecified chronic kidney disease: Secondary | ICD-10-CM | POA: Diagnosis not present

## 2023-07-30 DIAGNOSIS — F320 Major depressive disorder, single episode, mild: Secondary | ICD-10-CM | POA: Diagnosis not present

## 2023-07-30 DIAGNOSIS — S91001D Unspecified open wound, right ankle, subsequent encounter: Secondary | ICD-10-CM | POA: Diagnosis not present

## 2023-07-30 DIAGNOSIS — N261 Atrophy of kidney (terminal): Secondary | ICD-10-CM | POA: Insufficient documentation

## 2023-08-03 ENCOUNTER — Ambulatory Visit (INDEPENDENT_AMBULATORY_CARE_PROVIDER_SITE_OTHER): Payer: Medicare Other | Admitting: Urology

## 2023-08-03 ENCOUNTER — Encounter: Payer: Self-pay | Admitting: Urology

## 2023-08-03 VITALS — BP 142/89 | HR 56 | Temp 98.4°F

## 2023-08-03 DIAGNOSIS — N200 Calculus of kidney: Secondary | ICD-10-CM | POA: Diagnosis not present

## 2023-08-03 DIAGNOSIS — N312 Flaccid neuropathic bladder, not elsewhere classified: Secondary | ICD-10-CM

## 2023-08-03 DIAGNOSIS — N1330 Unspecified hydronephrosis: Secondary | ICD-10-CM | POA: Diagnosis not present

## 2023-08-03 DIAGNOSIS — Z9359 Other cystostomy status: Secondary | ICD-10-CM

## 2023-08-03 DIAGNOSIS — N261 Atrophy of kidney (terminal): Secondary | ICD-10-CM | POA: Diagnosis not present

## 2023-08-04 DIAGNOSIS — G35 Multiple sclerosis: Secondary | ICD-10-CM | POA: Diagnosis not present

## 2023-08-04 DIAGNOSIS — F320 Major depressive disorder, single episode, mild: Secondary | ICD-10-CM | POA: Diagnosis not present

## 2023-08-04 DIAGNOSIS — I739 Peripheral vascular disease, unspecified: Secondary | ICD-10-CM | POA: Diagnosis not present

## 2023-08-04 DIAGNOSIS — J449 Chronic obstructive pulmonary disease, unspecified: Secondary | ICD-10-CM | POA: Diagnosis not present

## 2023-08-04 DIAGNOSIS — S91001D Unspecified open wound, right ankle, subsequent encounter: Secondary | ICD-10-CM | POA: Diagnosis not present

## 2023-08-04 DIAGNOSIS — I129 Hypertensive chronic kidney disease with stage 1 through stage 4 chronic kidney disease, or unspecified chronic kidney disease: Secondary | ICD-10-CM | POA: Diagnosis not present

## 2023-08-06 DIAGNOSIS — G35 Multiple sclerosis: Secondary | ICD-10-CM | POA: Diagnosis not present

## 2023-08-06 DIAGNOSIS — J449 Chronic obstructive pulmonary disease, unspecified: Secondary | ICD-10-CM | POA: Diagnosis not present

## 2023-08-06 DIAGNOSIS — I739 Peripheral vascular disease, unspecified: Secondary | ICD-10-CM | POA: Diagnosis not present

## 2023-08-06 DIAGNOSIS — F320 Major depressive disorder, single episode, mild: Secondary | ICD-10-CM | POA: Diagnosis not present

## 2023-08-06 DIAGNOSIS — I129 Hypertensive chronic kidney disease with stage 1 through stage 4 chronic kidney disease, or unspecified chronic kidney disease: Secondary | ICD-10-CM | POA: Diagnosis not present

## 2023-08-06 DIAGNOSIS — S91001D Unspecified open wound, right ankle, subsequent encounter: Secondary | ICD-10-CM | POA: Diagnosis not present

## 2023-08-10 DIAGNOSIS — F411 Generalized anxiety disorder: Secondary | ICD-10-CM | POA: Diagnosis not present

## 2023-08-10 DIAGNOSIS — J449 Chronic obstructive pulmonary disease, unspecified: Secondary | ICD-10-CM | POA: Diagnosis not present

## 2023-08-10 DIAGNOSIS — N319 Neuromuscular dysfunction of bladder, unspecified: Secondary | ICD-10-CM | POA: Diagnosis not present

## 2023-08-10 DIAGNOSIS — F320 Major depressive disorder, single episode, mild: Secondary | ICD-10-CM | POA: Diagnosis not present

## 2023-08-10 DIAGNOSIS — Z466 Encounter for fitting and adjustment of urinary device: Secondary | ICD-10-CM | POA: Diagnosis not present

## 2023-08-10 DIAGNOSIS — I129 Hypertensive chronic kidney disease with stage 1 through stage 4 chronic kidney disease, or unspecified chronic kidney disease: Secondary | ICD-10-CM | POA: Diagnosis not present

## 2023-08-10 DIAGNOSIS — Z9981 Dependence on supplemental oxygen: Secondary | ICD-10-CM | POA: Diagnosis not present

## 2023-08-10 DIAGNOSIS — M545 Low back pain, unspecified: Secondary | ICD-10-CM | POA: Diagnosis not present

## 2023-08-10 DIAGNOSIS — G894 Chronic pain syndrome: Secondary | ICD-10-CM | POA: Diagnosis not present

## 2023-08-10 DIAGNOSIS — I739 Peripheral vascular disease, unspecified: Secondary | ICD-10-CM | POA: Diagnosis not present

## 2023-08-10 DIAGNOSIS — N184 Chronic kidney disease, stage 4 (severe): Secondary | ICD-10-CM | POA: Diagnosis not present

## 2023-08-10 DIAGNOSIS — R1310 Dysphagia, unspecified: Secondary | ICD-10-CM | POA: Diagnosis not present

## 2023-08-10 DIAGNOSIS — E039 Hypothyroidism, unspecified: Secondary | ICD-10-CM | POA: Diagnosis not present

## 2023-08-10 DIAGNOSIS — S91001D Unspecified open wound, right ankle, subsequent encounter: Secondary | ICD-10-CM | POA: Diagnosis not present

## 2023-08-10 DIAGNOSIS — G35 Multiple sclerosis: Secondary | ICD-10-CM | POA: Diagnosis not present

## 2023-08-10 DIAGNOSIS — E0590 Thyrotoxicosis, unspecified without thyrotoxic crisis or storm: Secondary | ICD-10-CM | POA: Diagnosis not present

## 2023-08-10 DIAGNOSIS — R7303 Prediabetes: Secondary | ICD-10-CM | POA: Diagnosis not present

## 2023-08-11 ENCOUNTER — Telehealth: Payer: Self-pay

## 2023-08-11 NOTE — Telephone Encounter (Signed)
Can you please advise on how to proceed once you discuss with Dr. Retta Diones and I will update patient.

## 2023-08-11 NOTE — Telephone Encounter (Signed)
Patient scheduled with MD for follow up in January

## 2023-08-11 NOTE — Telephone Encounter (Signed)
Patient's wife -Chyrl Civatte  [phone]  Status of what kind of kidney procedure and when?  Please advise.

## 2023-08-13 ENCOUNTER — Ambulatory Visit: Payer: Medicare Other

## 2023-08-13 DIAGNOSIS — R339 Retention of urine, unspecified: Secondary | ICD-10-CM

## 2023-08-13 NOTE — Progress Notes (Addendum)
Suprapubic Cath Change  Patient is present today for a suprapubic catheter change due to urinary retention.  10ml of water was drained from the balloon, a 24FR foley cath was removed from the tract with out difficulty.  Site was cleaned and prepped in a sterile fashion with betadine.  A 24FR foley cath was replaced into the tract no complications were noted. Urine return was noted, 10 ml of sterile water was inflated into the balloon and a leg bag was attached for drainage.  Patient tolerated well.   Performed by: Aryahi Denzler LPN  Follow up: keep scheduled NV  

## 2023-08-14 DIAGNOSIS — F320 Major depressive disorder, single episode, mild: Secondary | ICD-10-CM | POA: Diagnosis not present

## 2023-08-14 DIAGNOSIS — I129 Hypertensive chronic kidney disease with stage 1 through stage 4 chronic kidney disease, or unspecified chronic kidney disease: Secondary | ICD-10-CM | POA: Diagnosis not present

## 2023-08-14 DIAGNOSIS — G35 Multiple sclerosis: Secondary | ICD-10-CM | POA: Diagnosis not present

## 2023-08-14 DIAGNOSIS — I739 Peripheral vascular disease, unspecified: Secondary | ICD-10-CM | POA: Diagnosis not present

## 2023-08-14 DIAGNOSIS — J449 Chronic obstructive pulmonary disease, unspecified: Secondary | ICD-10-CM | POA: Diagnosis not present

## 2023-08-14 DIAGNOSIS — S91001D Unspecified open wound, right ankle, subsequent encounter: Secondary | ICD-10-CM | POA: Diagnosis not present

## 2023-08-17 DIAGNOSIS — F320 Major depressive disorder, single episode, mild: Secondary | ICD-10-CM | POA: Diagnosis not present

## 2023-08-17 DIAGNOSIS — I129 Hypertensive chronic kidney disease with stage 1 through stage 4 chronic kidney disease, or unspecified chronic kidney disease: Secondary | ICD-10-CM | POA: Diagnosis not present

## 2023-08-17 DIAGNOSIS — G35 Multiple sclerosis: Secondary | ICD-10-CM | POA: Diagnosis not present

## 2023-08-17 DIAGNOSIS — I739 Peripheral vascular disease, unspecified: Secondary | ICD-10-CM | POA: Diagnosis not present

## 2023-08-17 DIAGNOSIS — S91001D Unspecified open wound, right ankle, subsequent encounter: Secondary | ICD-10-CM | POA: Diagnosis not present

## 2023-08-17 DIAGNOSIS — J449 Chronic obstructive pulmonary disease, unspecified: Secondary | ICD-10-CM | POA: Diagnosis not present

## 2023-08-20 DIAGNOSIS — F320 Major depressive disorder, single episode, mild: Secondary | ICD-10-CM | POA: Diagnosis not present

## 2023-08-20 DIAGNOSIS — J449 Chronic obstructive pulmonary disease, unspecified: Secondary | ICD-10-CM | POA: Diagnosis not present

## 2023-08-20 DIAGNOSIS — I129 Hypertensive chronic kidney disease with stage 1 through stage 4 chronic kidney disease, or unspecified chronic kidney disease: Secondary | ICD-10-CM | POA: Diagnosis not present

## 2023-08-20 DIAGNOSIS — S91001D Unspecified open wound, right ankle, subsequent encounter: Secondary | ICD-10-CM | POA: Diagnosis not present

## 2023-08-20 DIAGNOSIS — G35 Multiple sclerosis: Secondary | ICD-10-CM | POA: Diagnosis not present

## 2023-08-20 DIAGNOSIS — I739 Peripheral vascular disease, unspecified: Secondary | ICD-10-CM | POA: Diagnosis not present

## 2023-08-24 DIAGNOSIS — J449 Chronic obstructive pulmonary disease, unspecified: Secondary | ICD-10-CM | POA: Diagnosis not present

## 2023-08-24 DIAGNOSIS — I739 Peripheral vascular disease, unspecified: Secondary | ICD-10-CM | POA: Diagnosis not present

## 2023-08-24 DIAGNOSIS — S91001D Unspecified open wound, right ankle, subsequent encounter: Secondary | ICD-10-CM | POA: Diagnosis not present

## 2023-08-24 DIAGNOSIS — F320 Major depressive disorder, single episode, mild: Secondary | ICD-10-CM | POA: Diagnosis not present

## 2023-08-24 DIAGNOSIS — G35 Multiple sclerosis: Secondary | ICD-10-CM | POA: Diagnosis not present

## 2023-08-24 DIAGNOSIS — I129 Hypertensive chronic kidney disease with stage 1 through stage 4 chronic kidney disease, or unspecified chronic kidney disease: Secondary | ICD-10-CM | POA: Diagnosis not present

## 2023-08-26 DIAGNOSIS — I739 Peripheral vascular disease, unspecified: Secondary | ICD-10-CM | POA: Diagnosis not present

## 2023-08-26 DIAGNOSIS — F320 Major depressive disorder, single episode, mild: Secondary | ICD-10-CM | POA: Diagnosis not present

## 2023-08-26 DIAGNOSIS — J449 Chronic obstructive pulmonary disease, unspecified: Secondary | ICD-10-CM | POA: Diagnosis not present

## 2023-08-26 DIAGNOSIS — S91001D Unspecified open wound, right ankle, subsequent encounter: Secondary | ICD-10-CM | POA: Diagnosis not present

## 2023-08-26 DIAGNOSIS — I129 Hypertensive chronic kidney disease with stage 1 through stage 4 chronic kidney disease, or unspecified chronic kidney disease: Secondary | ICD-10-CM | POA: Diagnosis not present

## 2023-08-26 DIAGNOSIS — G35 Multiple sclerosis: Secondary | ICD-10-CM | POA: Diagnosis not present

## 2023-09-02 DIAGNOSIS — F320 Major depressive disorder, single episode, mild: Secondary | ICD-10-CM | POA: Diagnosis not present

## 2023-09-02 DIAGNOSIS — J449 Chronic obstructive pulmonary disease, unspecified: Secondary | ICD-10-CM | POA: Diagnosis not present

## 2023-09-02 DIAGNOSIS — G35 Multiple sclerosis: Secondary | ICD-10-CM | POA: Diagnosis not present

## 2023-09-02 DIAGNOSIS — I129 Hypertensive chronic kidney disease with stage 1 through stage 4 chronic kidney disease, or unspecified chronic kidney disease: Secondary | ICD-10-CM | POA: Diagnosis not present

## 2023-09-02 DIAGNOSIS — S91001D Unspecified open wound, right ankle, subsequent encounter: Secondary | ICD-10-CM | POA: Diagnosis not present

## 2023-09-02 DIAGNOSIS — I739 Peripheral vascular disease, unspecified: Secondary | ICD-10-CM | POA: Diagnosis not present

## 2023-09-04 DIAGNOSIS — I129 Hypertensive chronic kidney disease with stage 1 through stage 4 chronic kidney disease, or unspecified chronic kidney disease: Secondary | ICD-10-CM | POA: Diagnosis not present

## 2023-09-04 DIAGNOSIS — I739 Peripheral vascular disease, unspecified: Secondary | ICD-10-CM | POA: Diagnosis not present

## 2023-09-04 DIAGNOSIS — S91001D Unspecified open wound, right ankle, subsequent encounter: Secondary | ICD-10-CM | POA: Diagnosis not present

## 2023-09-04 DIAGNOSIS — F320 Major depressive disorder, single episode, mild: Secondary | ICD-10-CM | POA: Diagnosis not present

## 2023-09-04 DIAGNOSIS — J449 Chronic obstructive pulmonary disease, unspecified: Secondary | ICD-10-CM | POA: Diagnosis not present

## 2023-09-04 DIAGNOSIS — G35 Multiple sclerosis: Secondary | ICD-10-CM | POA: Diagnosis not present

## 2023-09-09 DIAGNOSIS — F411 Generalized anxiety disorder: Secondary | ICD-10-CM | POA: Diagnosis not present

## 2023-09-09 DIAGNOSIS — E039 Hypothyroidism, unspecified: Secondary | ICD-10-CM | POA: Diagnosis not present

## 2023-09-09 DIAGNOSIS — L97311 Non-pressure chronic ulcer of right ankle limited to breakdown of skin: Secondary | ICD-10-CM | POA: Diagnosis not present

## 2023-09-09 DIAGNOSIS — E0590 Thyrotoxicosis, unspecified without thyrotoxic crisis or storm: Secondary | ICD-10-CM | POA: Diagnosis not present

## 2023-09-09 DIAGNOSIS — N319 Neuromuscular dysfunction of bladder, unspecified: Secondary | ICD-10-CM | POA: Diagnosis not present

## 2023-09-09 DIAGNOSIS — Z435 Encounter for attention to cystostomy: Secondary | ICD-10-CM | POA: Diagnosis not present

## 2023-09-09 DIAGNOSIS — N184 Chronic kidney disease, stage 4 (severe): Secondary | ICD-10-CM | POA: Diagnosis not present

## 2023-09-09 DIAGNOSIS — G894 Chronic pain syndrome: Secondary | ICD-10-CM | POA: Diagnosis not present

## 2023-09-09 DIAGNOSIS — G35 Multiple sclerosis: Secondary | ICD-10-CM | POA: Diagnosis not present

## 2023-09-09 DIAGNOSIS — R1310 Dysphagia, unspecified: Secondary | ICD-10-CM | POA: Diagnosis not present

## 2023-09-09 DIAGNOSIS — M545 Low back pain, unspecified: Secondary | ICD-10-CM | POA: Diagnosis not present

## 2023-09-09 DIAGNOSIS — R7303 Prediabetes: Secondary | ICD-10-CM | POA: Diagnosis not present

## 2023-09-09 DIAGNOSIS — I739 Peripheral vascular disease, unspecified: Secondary | ICD-10-CM | POA: Diagnosis not present

## 2023-09-09 DIAGNOSIS — J449 Chronic obstructive pulmonary disease, unspecified: Secondary | ICD-10-CM | POA: Diagnosis not present

## 2023-09-09 DIAGNOSIS — I129 Hypertensive chronic kidney disease with stage 1 through stage 4 chronic kidney disease, or unspecified chronic kidney disease: Secondary | ICD-10-CM | POA: Diagnosis not present

## 2023-09-09 DIAGNOSIS — Z9981 Dependence on supplemental oxygen: Secondary | ICD-10-CM | POA: Diagnosis not present

## 2023-09-09 DIAGNOSIS — F320 Major depressive disorder, single episode, mild: Secondary | ICD-10-CM | POA: Diagnosis not present

## 2023-09-10 ENCOUNTER — Ambulatory Visit: Payer: Medicare Other

## 2023-09-10 DIAGNOSIS — R339 Retention of urine, unspecified: Secondary | ICD-10-CM | POA: Diagnosis not present

## 2023-09-10 NOTE — Progress Notes (Signed)
Suprapubic Cath Change  Patient is present today for a suprapubic catheter change due to urinary retention.  10ml of water was drained from the balloon, a 24FR foley cath was removed from the tract with out difficulty.  Site was cleaned and prepped in a sterile fashion with betadine.  A 24FR foley cath was replaced into the tract no complications were noted. Urine return was noted, 10 ml of sterile water was inflated into the balloon and a leg bag was attached for drainage.  Patient tolerated well. A night bag was given to patient and proper instruction was given on how to switch bags.    Performed by: Guss Bunde, CMA  Follow up: as scheduled with MD for sp cath chagne

## 2023-09-14 DIAGNOSIS — J449 Chronic obstructive pulmonary disease, unspecified: Secondary | ICD-10-CM | POA: Diagnosis not present

## 2023-09-14 DIAGNOSIS — L97311 Non-pressure chronic ulcer of right ankle limited to breakdown of skin: Secondary | ICD-10-CM | POA: Diagnosis not present

## 2023-09-14 DIAGNOSIS — I739 Peripheral vascular disease, unspecified: Secondary | ICD-10-CM | POA: Diagnosis not present

## 2023-09-14 DIAGNOSIS — G35 Multiple sclerosis: Secondary | ICD-10-CM | POA: Diagnosis not present

## 2023-09-14 DIAGNOSIS — I129 Hypertensive chronic kidney disease with stage 1 through stage 4 chronic kidney disease, or unspecified chronic kidney disease: Secondary | ICD-10-CM | POA: Diagnosis not present

## 2023-09-14 DIAGNOSIS — F320 Major depressive disorder, single episode, mild: Secondary | ICD-10-CM | POA: Diagnosis not present

## 2023-09-15 ENCOUNTER — Other Ambulatory Visit (HOSPITAL_COMMUNITY): Payer: Self-pay | Admitting: Interventional Radiology

## 2023-09-15 ENCOUNTER — Ambulatory Visit (HOSPITAL_COMMUNITY)
Admission: RE | Admit: 2023-09-15 | Discharge: 2023-09-15 | Disposition: A | Discharge status: Home / Self Care | Payer: Medicare Other | Source: Ambulatory Visit | Attending: Interventional Radiology | Admitting: Interventional Radiology

## 2023-09-15 DIAGNOSIS — N319 Neuromuscular dysfunction of bladder, unspecified: Secondary | ICD-10-CM | POA: Diagnosis not present

## 2023-09-15 DIAGNOSIS — Z436 Encounter for attention to other artificial openings of urinary tract: Secondary | ICD-10-CM | POA: Insufficient documentation

## 2023-09-15 HISTORY — PX: IR NEPHROSTOMY EXCHANGE LEFT: IMG6069

## 2023-09-15 MED ORDER — LIDOCAINE HCL 1 % IJ SOLN
INTRAMUSCULAR | Status: AC
  Filled 2023-09-15: qty 20

## 2023-09-15 MED ORDER — IOHEXOL 300 MG/ML  SOLN: 50.0000 mL | Freq: Once | INTRAMUSCULAR | Status: DC | PRN

## 2023-09-15 NOTE — Procedures (Signed)
Interventional Radiology Procedure Note  Procedure: Image guided drain exchange 74F left PCN Complications: None     Recommendations: - Routine PCN change   - routine changes, every 8 weeks  Signed,  Jocelin Schuelke [address]. [address], DO

## 2023-09-17 DIAGNOSIS — F320 Major depressive disorder, single episode, mild: Secondary | ICD-10-CM | POA: Diagnosis not present

## 2023-09-17 DIAGNOSIS — G35 Multiple sclerosis: Secondary | ICD-10-CM | POA: Diagnosis not present

## 2023-09-17 DIAGNOSIS — L97311 Non-pressure chronic ulcer of right ankle limited to breakdown of skin: Secondary | ICD-10-CM | POA: Diagnosis not present

## 2023-09-17 DIAGNOSIS — I129 Hypertensive chronic kidney disease with stage 1 through stage 4 chronic kidney disease, or unspecified chronic kidney disease: Secondary | ICD-10-CM | POA: Diagnosis not present

## 2023-09-17 DIAGNOSIS — J449 Chronic obstructive pulmonary disease, unspecified: Secondary | ICD-10-CM | POA: Diagnosis not present

## 2023-09-17 DIAGNOSIS — I739 Peripheral vascular disease, unspecified: Secondary | ICD-10-CM | POA: Diagnosis not present

## 2023-09-21 ENCOUNTER — Other Ambulatory Visit (HOSPITAL_COMMUNITY): Payer: Medicare Other

## 2023-09-21 DIAGNOSIS — I129 Hypertensive chronic kidney disease with stage 1 through stage 4 chronic kidney disease, or unspecified chronic kidney disease: Secondary | ICD-10-CM | POA: Diagnosis not present

## 2023-09-21 DIAGNOSIS — G35 Multiple sclerosis: Secondary | ICD-10-CM | POA: Diagnosis not present

## 2023-09-21 DIAGNOSIS — L97311 Non-pressure chronic ulcer of right ankle limited to breakdown of skin: Secondary | ICD-10-CM | POA: Diagnosis not present

## 2023-09-21 DIAGNOSIS — F320 Major depressive disorder, single episode, mild: Secondary | ICD-10-CM | POA: Diagnosis not present

## 2023-09-21 DIAGNOSIS — J449 Chronic obstructive pulmonary disease, unspecified: Secondary | ICD-10-CM | POA: Diagnosis not present

## 2023-09-21 DIAGNOSIS — I739 Peripheral vascular disease, unspecified: Secondary | ICD-10-CM | POA: Diagnosis not present

## 2023-09-25 DIAGNOSIS — H04123 Dry eye syndrome of bilateral lacrimal glands: Secondary | ICD-10-CM | POA: Diagnosis not present

## 2023-09-26 DIAGNOSIS — J449 Chronic obstructive pulmonary disease, unspecified: Secondary | ICD-10-CM | POA: Diagnosis not present

## 2023-09-26 DIAGNOSIS — I739 Peripheral vascular disease, unspecified: Secondary | ICD-10-CM | POA: Diagnosis not present

## 2023-09-26 DIAGNOSIS — L97311 Non-pressure chronic ulcer of right ankle limited to breakdown of skin: Secondary | ICD-10-CM | POA: Diagnosis not present

## 2023-09-26 DIAGNOSIS — I129 Hypertensive chronic kidney disease with stage 1 through stage 4 chronic kidney disease, or unspecified chronic kidney disease: Secondary | ICD-10-CM | POA: Diagnosis not present

## 2023-09-26 DIAGNOSIS — G35 Multiple sclerosis: Secondary | ICD-10-CM | POA: Diagnosis not present

## 2023-09-26 DIAGNOSIS — F320 Major depressive disorder, single episode, mild: Secondary | ICD-10-CM | POA: Diagnosis not present

## 2023-09-29 ENCOUNTER — Other Ambulatory Visit: Payer: Self-pay

## 2023-09-29 ENCOUNTER — Emergency Department (HOSPITAL_COMMUNITY): Payer: Medicare Other

## 2023-09-29 ENCOUNTER — Inpatient Hospital Stay (HOSPITAL_COMMUNITY)
Admission: EM | Admit: 2023-09-29 | Discharge: 2023-10-12 | DRG: 177 | Disposition: A | Discharge status: Skilled Nursing Facility | Payer: Medicare Other | Attending: Internal Medicine | Admitting: Internal Medicine

## 2023-09-29 ENCOUNTER — Encounter (HOSPITAL_COMMUNITY): Payer: Self-pay

## 2023-09-29 DIAGNOSIS — E870 Hyperosmolality and hypernatremia: Secondary | ICD-10-CM | POA: Diagnosis present

## 2023-09-29 DIAGNOSIS — N184 Chronic kidney disease, stage 4 (severe): Secondary | ICD-10-CM | POA: Diagnosis not present

## 2023-09-29 DIAGNOSIS — I6612 Occlusion and stenosis of left anterior cerebral artery: Secondary | ICD-10-CM | POA: Diagnosis not present

## 2023-09-29 DIAGNOSIS — I10 Essential (primary) hypertension: Secondary | ICD-10-CM | POA: Diagnosis present

## 2023-09-29 DIAGNOSIS — I420 Dilated cardiomyopathy: Secondary | ICD-10-CM | POA: Diagnosis not present

## 2023-09-29 DIAGNOSIS — R059 Cough, unspecified: Secondary | ICD-10-CM | POA: Diagnosis not present

## 2023-09-29 DIAGNOSIS — Z936 Other artificial openings of urinary tract status: Secondary | ICD-10-CM | POA: Diagnosis not present

## 2023-09-29 DIAGNOSIS — J8410 Pulmonary fibrosis, unspecified: Secondary | ICD-10-CM | POA: Diagnosis present

## 2023-09-29 DIAGNOSIS — Z681 Body mass index (BMI) 19 or less, adult: Secondary | ICD-10-CM | POA: Diagnosis not present

## 2023-09-29 DIAGNOSIS — R7989 Other specified abnormal findings of blood chemistry: Secondary | ICD-10-CM | POA: Diagnosis present

## 2023-09-29 DIAGNOSIS — I70233 Atherosclerosis of native arteries of right leg with ulceration of ankle: Secondary | ICD-10-CM | POA: Diagnosis present

## 2023-09-29 DIAGNOSIS — I63232 Cerebral infarction due to unspecified occlusion or stenosis of left carotid arteries: Secondary | ICD-10-CM | POA: Diagnosis not present

## 2023-09-29 DIAGNOSIS — R636 Underweight: Secondary | ICD-10-CM | POA: Diagnosis present

## 2023-09-29 DIAGNOSIS — R471 Dysarthria and anarthria: Secondary | ICD-10-CM | POA: Diagnosis present

## 2023-09-29 DIAGNOSIS — R41841 Cognitive communication deficit: Secondary | ICD-10-CM | POA: Diagnosis not present

## 2023-09-29 DIAGNOSIS — L89316 Pressure-induced deep tissue damage of right buttock: Secondary | ICD-10-CM | POA: Diagnosis present

## 2023-09-29 DIAGNOSIS — I2510 Atherosclerotic heart disease of native coronary artery without angina pectoris: Secondary | ICD-10-CM | POA: Diagnosis present

## 2023-09-29 DIAGNOSIS — I6622 Occlusion and stenosis of left posterior cerebral artery: Secondary | ICD-10-CM | POA: Diagnosis not present

## 2023-09-29 DIAGNOSIS — R1312 Dysphagia, oropharyngeal phase: Secondary | ICD-10-CM | POA: Diagnosis present

## 2023-09-29 DIAGNOSIS — J1282 Pneumonia due to coronavirus disease 2019: Secondary | ICD-10-CM | POA: Diagnosis not present

## 2023-09-29 DIAGNOSIS — N390 Urinary tract infection, site not specified: Secondary | ICD-10-CM | POA: Diagnosis present

## 2023-09-29 DIAGNOSIS — I480 Paroxysmal atrial fibrillation: Secondary | ICD-10-CM | POA: Diagnosis present

## 2023-09-29 DIAGNOSIS — G8194 Hemiplegia, unspecified affecting left nondominant side: Secondary | ICD-10-CM | POA: Diagnosis present

## 2023-09-29 DIAGNOSIS — U071 COVID-19: Secondary | ICD-10-CM | POA: Diagnosis not present

## 2023-09-29 DIAGNOSIS — N179 Acute kidney failure, unspecified: Secondary | ICD-10-CM | POA: Diagnosis present

## 2023-09-29 DIAGNOSIS — R2689 Other abnormalities of gait and mobility: Secondary | ICD-10-CM | POA: Diagnosis not present

## 2023-09-29 DIAGNOSIS — Z7401 Bed confinement status: Secondary | ICD-10-CM | POA: Diagnosis not present

## 2023-09-29 DIAGNOSIS — I6501 Occlusion and stenosis of right vertebral artery: Secondary | ICD-10-CM | POA: Diagnosis present

## 2023-09-29 DIAGNOSIS — E039 Hypothyroidism, unspecified: Secondary | ICD-10-CM | POA: Diagnosis not present

## 2023-09-29 DIAGNOSIS — Z881 Allergy status to other antibiotic agents status: Secondary | ICD-10-CM

## 2023-09-29 DIAGNOSIS — I6781 Acute cerebrovascular insufficiency: Secondary | ICD-10-CM | POA: Diagnosis not present

## 2023-09-29 DIAGNOSIS — L89524 Pressure ulcer of left ankle, stage 4: Secondary | ICD-10-CM | POA: Diagnosis present

## 2023-09-29 DIAGNOSIS — G35 Multiple sclerosis: Secondary | ICD-10-CM | POA: Diagnosis present

## 2023-09-29 DIAGNOSIS — Z87891 Personal history of nicotine dependence: Secondary | ICD-10-CM

## 2023-09-29 DIAGNOSIS — Z79899 Other long term (current) drug therapy: Secondary | ICD-10-CM

## 2023-09-29 DIAGNOSIS — I63533 Cerebral infarction due to unspecified occlusion or stenosis of bilateral posterior cerebral arteries: Secondary | ICD-10-CM | POA: Diagnosis not present

## 2023-09-29 DIAGNOSIS — Z860100 Personal history of colon polyps, unspecified: Secondary | ICD-10-CM

## 2023-09-29 DIAGNOSIS — J439 Emphysema, unspecified: Secondary | ICD-10-CM | POA: Diagnosis present

## 2023-09-29 DIAGNOSIS — I130 Hypertensive heart and chronic kidney disease with heart failure and stage 1 through stage 4 chronic kidney disease, or unspecified chronic kidney disease: Secondary | ICD-10-CM | POA: Diagnosis not present

## 2023-09-29 DIAGNOSIS — I679 Cerebrovascular disease, unspecified: Secondary | ICD-10-CM | POA: Diagnosis not present

## 2023-09-29 DIAGNOSIS — M62561 Muscle wasting and atrophy, not elsewhere classified, right lower leg: Secondary | ICD-10-CM | POA: Diagnosis not present

## 2023-09-29 DIAGNOSIS — K439 Ventral hernia without obstruction or gangrene: Secondary | ICD-10-CM | POA: Diagnosis present

## 2023-09-29 DIAGNOSIS — G35D Multiple sclerosis, unspecified: Secondary | ICD-10-CM | POA: Diagnosis present

## 2023-09-29 DIAGNOSIS — L97319 Non-pressure chronic ulcer of right ankle with unspecified severity: Secondary | ICD-10-CM | POA: Diagnosis present

## 2023-09-29 DIAGNOSIS — N139 Obstructive and reflux uropathy, unspecified: Secondary | ICD-10-CM | POA: Diagnosis not present

## 2023-09-29 DIAGNOSIS — Z7982 Long term (current) use of aspirin: Secondary | ICD-10-CM

## 2023-09-29 DIAGNOSIS — R7881 Bacteremia: Secondary | ICD-10-CM | POA: Diagnosis present

## 2023-09-29 DIAGNOSIS — E876 Hypokalemia: Secondary | ICD-10-CM | POA: Diagnosis present

## 2023-09-29 DIAGNOSIS — R0902 Hypoxemia: Secondary | ICD-10-CM | POA: Diagnosis not present

## 2023-09-29 DIAGNOSIS — R29705 NIHSS score 5: Secondary | ICD-10-CM | POA: Diagnosis present

## 2023-09-29 DIAGNOSIS — Z79890 Hormone replacement therapy (postmenopausal): Secondary | ICD-10-CM

## 2023-09-29 DIAGNOSIS — Z8249 Family history of ischemic heart disease and other diseases of the circulatory system: Secondary | ICD-10-CM

## 2023-09-29 DIAGNOSIS — R279 Unspecified lack of coordination: Secondary | ICD-10-CM | POA: Diagnosis not present

## 2023-09-29 DIAGNOSIS — Z823 Family history of stroke: Secondary | ICD-10-CM

## 2023-09-29 DIAGNOSIS — I739 Peripheral vascular disease, unspecified: Secondary | ICD-10-CM | POA: Diagnosis not present

## 2023-09-29 DIAGNOSIS — G8929 Other chronic pain: Secondary | ICD-10-CM | POA: Diagnosis present

## 2023-09-29 DIAGNOSIS — I639 Cerebral infarction, unspecified: Secondary | ICD-10-CM | POA: Diagnosis not present

## 2023-09-29 DIAGNOSIS — N319 Neuromuscular dysfunction of bladder, unspecified: Secondary | ICD-10-CM | POA: Diagnosis not present

## 2023-09-29 DIAGNOSIS — R918 Other nonspecific abnormal finding of lung field: Secondary | ICD-10-CM | POA: Diagnosis not present

## 2023-09-29 DIAGNOSIS — T83510A Infection and inflammatory reaction due to cystostomy catheter, initial encounter: Secondary | ICD-10-CM | POA: Diagnosis not present

## 2023-09-29 DIAGNOSIS — Z87442 Personal history of urinary calculi: Secondary | ICD-10-CM

## 2023-09-29 DIAGNOSIS — I5032 Chronic diastolic (congestive) heart failure: Secondary | ICD-10-CM | POA: Diagnosis present

## 2023-09-29 DIAGNOSIS — T83022D Displacement of nephrostomy catheter, subsequent encounter: Secondary | ICD-10-CM | POA: Diagnosis not present

## 2023-09-29 DIAGNOSIS — Z820 Family history of epilepsy and other diseases of the nervous system: Secondary | ICD-10-CM

## 2023-09-29 DIAGNOSIS — Z9049 Acquired absence of other specified parts of digestive tract: Secondary | ICD-10-CM

## 2023-09-29 DIAGNOSIS — B953 Streptococcus pneumoniae as the cause of diseases classified elsewhere: Secondary | ICD-10-CM | POA: Diagnosis present

## 2023-09-29 DIAGNOSIS — Z9359 Other cystostomy status: Secondary | ICD-10-CM

## 2023-09-29 DIAGNOSIS — Z7902 Long term (current) use of antithrombotics/antiplatelets: Secondary | ICD-10-CM

## 2023-09-29 DIAGNOSIS — J1281 Pneumonia due to SARS-associated coronavirus: Secondary | ICD-10-CM | POA: Diagnosis not present

## 2023-09-29 DIAGNOSIS — R1310 Dysphagia, unspecified: Secondary | ICD-10-CM

## 2023-09-29 DIAGNOSIS — Z28310 Unvaccinated for covid-19: Secondary | ICD-10-CM

## 2023-09-29 DIAGNOSIS — I959 Hypotension, unspecified: Secondary | ICD-10-CM | POA: Diagnosis not present

## 2023-09-29 DIAGNOSIS — I6389 Other cerebral infarction: Secondary | ICD-10-CM | POA: Diagnosis not present

## 2023-09-29 DIAGNOSIS — G2581 Restless legs syndrome: Secondary | ICD-10-CM | POA: Diagnosis not present

## 2023-09-29 DIAGNOSIS — M62562 Muscle wasting and atrophy, not elsewhere classified, left lower leg: Secondary | ICD-10-CM | POA: Diagnosis not present

## 2023-09-29 DIAGNOSIS — I63542 Cerebral infarction due to unspecified occlusion or stenosis of left cerebellar artery: Secondary | ICD-10-CM | POA: Diagnosis present

## 2023-09-29 DIAGNOSIS — L89326 Pressure-induced deep tissue damage of left buttock: Secondary | ICD-10-CM | POA: Diagnosis present

## 2023-09-29 DIAGNOSIS — M549 Dorsalgia, unspecified: Secondary | ICD-10-CM | POA: Diagnosis present

## 2023-09-29 DIAGNOSIS — Z751 Person awaiting admission to adequate facility elsewhere: Secondary | ICD-10-CM

## 2023-09-29 DIAGNOSIS — Z8673 Personal history of transient ischemic attack (TIA), and cerebral infarction without residual deficits: Secondary | ICD-10-CM | POA: Diagnosis not present

## 2023-09-29 DIAGNOSIS — R29810 Facial weakness: Secondary | ICD-10-CM | POA: Diagnosis present

## 2023-09-29 DIAGNOSIS — R531 Weakness: Secondary | ICD-10-CM | POA: Diagnosis not present

## 2023-09-29 DIAGNOSIS — E785 Hyperlipidemia, unspecified: Secondary | ICD-10-CM | POA: Diagnosis present

## 2023-09-29 DIAGNOSIS — J449 Chronic obstructive pulmonary disease, unspecified: Secondary | ICD-10-CM | POA: Diagnosis not present

## 2023-09-29 LAB — CBG MONITORING, ED: Glucose-Capillary: 139 mg/dL — ABNORMAL HIGH (ref 70–99)

## 2023-09-29 LAB — HEPATIC FUNCTION PANEL
ALT: 19 U/L (ref 0–44)
AST: 34 U/L (ref 15–41)
Albumin: 3 g/dL — ABNORMAL LOW (ref 3.5–5.0)
Alkaline Phosphatase: 69 U/L (ref 38–126)
Bilirubin, Direct: 0.3 mg/dL — ABNORMAL HIGH (ref 0.0–0.2)
Indirect Bilirubin: 0.4 mg/dL (ref 0.3–0.9)
Total Bilirubin: 0.7 mg/dL (ref 0.0–1.2)
Total Protein: 7.4 g/dL (ref 6.5–8.1)

## 2023-09-29 LAB — URINALYSIS, ROUTINE W REFLEX MICROSCOPIC
Bilirubin Urine: NEGATIVE
Glucose, UA: NEGATIVE mg/dL
Ketones, ur: NEGATIVE mg/dL
Nitrite: NEGATIVE
Protein, ur: 100 mg/dL — AB
Specific Gravity, Urine: 1.015 (ref 1.005–1.030)
WBC, UA: >50 WBC/hpf (ref 0–5)
pH: 6 (ref 5.0–8.0)

## 2023-09-29 LAB — RESP PANEL BY RT-PCR (RSV, FLU A&B, COVID)  RVPGX2
Influenza A by PCR: NEGATIVE
Influenza B by PCR: NEGATIVE
Resp Syncytial Virus by PCR: NEGATIVE
SARS Coronavirus 2 by RT PCR: POSITIVE — AB

## 2023-09-29 LAB — CBC
HCT: 36.9 % — ABNORMAL LOW (ref 39.0–52.0)
Hemoglobin: 11.9 g/dL — ABNORMAL LOW (ref 13.0–17.0)
MCH: 30 pg (ref 26.0–34.0)
MCHC: 32.2 g/dL (ref 30.0–36.0)
MCV: 92.9 fL (ref 80.0–100.0)
Platelets: 136 10*3/uL — ABNORMAL LOW (ref 150–400)
RBC: 3.97 MIL/uL — ABNORMAL LOW (ref 4.22–5.81)
RDW: 15.6 % — ABNORMAL HIGH (ref 11.5–15.5)
WBC: 6.5 10*3/uL (ref 4.0–10.5)
nRBC: 0 % (ref 0.0–0.2)

## 2023-09-29 LAB — BASIC METABOLIC PANEL
Anion gap: 10 (ref 5–15)
BUN: 60 mg/dL — ABNORMAL HIGH (ref 8–23)
CO2: 21 mmol/L — ABNORMAL LOW (ref 22–32)
Calcium: 8.6 mg/dL — ABNORMAL LOW (ref 8.9–10.3)
Chloride: 109 mmol/L (ref 98–111)
Creatinine, Ser: 3.97 mg/dL — ABNORMAL HIGH (ref 0.61–1.24)
GFR, Estimated: 15 mL/min — ABNORMAL LOW (ref >60)
Glucose, Bld: 144 mg/dL — ABNORMAL HIGH (ref 70–99)
Potassium: 3.8 mmol/L (ref 3.5–5.1)
Sodium: 140 mmol/L (ref 135–145)

## 2023-09-29 LAB — TROPONIN I (HIGH SENSITIVITY)
Troponin I (High Sensitivity): 46 ng/L — ABNORMAL HIGH (ref <18)
Troponin I (High Sensitivity): 35 ng/L — ABNORMAL HIGH (ref <18)

## 2023-09-29 LAB — BRAIN NATRIURETIC PEPTIDE: B Natriuretic Peptide: 488 pg/mL — ABNORMAL HIGH (ref 0.0–100.0)

## 2023-09-29 LAB — LIPASE, BLOOD: Lipase: 21 U/L (ref 11–51)

## 2023-09-29 MED ORDER — SODIUM CHLORIDE 0.9 % IV SOLN
1.0000 g | Freq: Once | INTRAVENOUS | Status: AC
Start: 2023-09-29 — End: 2023-09-29
  Administered 2023-09-29: 1 g via INTRAVENOUS
  Filled 2023-09-29: qty 10

## 2023-09-29 MED ORDER — SODIUM CHLORIDE 0.9 % IV SOLN: 100.0000 mg | Freq: Every day | INTRAVENOUS | Status: DC

## 2023-09-29 MED ORDER — AMLODIPINE BESYLATE 5 MG PO TABS
5.0000 mg | ORAL_TABLET | Freq: Every evening | ORAL | Status: DC
  Administered 2023-09-30 – 2023-10-07 (×8): 5 mg via ORAL
  Filled 2023-09-29 (×8): qty 1

## 2023-09-29 MED ORDER — POLYVINYL ALCOHOL 1.4 % OP SOLN: 1.0000 [drp] | Freq: Four times a day (QID) | OPHTHALMIC | Status: DC | PRN

## 2023-09-29 MED ORDER — ACETAMINOPHEN 160 MG/5ML PO SOLN: 650.0000 mg | ORAL | Status: DC | PRN

## 2023-09-29 MED ORDER — SODIUM CHLORIDE 0.9 % IV SOLN
100.0000 mg | Freq: Every day | INTRAVENOUS | Status: AC
  Administered 2023-09-30: 100 mg via INTRAVENOUS
  Filled 2023-09-29 (×2): qty 20

## 2023-09-29 MED ORDER — DEXAMETHASONE SODIUM PHOSPHATE 10 MG/ML IJ SOLN
6.0000 mg | Freq: Once | INTRAMUSCULAR | Status: AC
  Administered 2023-09-29: 6 mg via INTRAVENOUS
  Filled 2023-09-29: qty 1

## 2023-09-29 MED ORDER — SODIUM CHLORIDE 0.9 % IV BOLUS
1000.0000 mL | Freq: Once | INTRAVENOUS | Status: AC
  Administered 2023-09-29: 1000 mL via INTRAVENOUS

## 2023-09-29 MED ORDER — ASPIRIN 81 MG PO CHEW
324.0000 mg | CHEWABLE_TABLET | Freq: Once | ORAL | Status: AC
  Administered 2023-09-29: 324 mg via ORAL
  Filled 2023-09-29: qty 4

## 2023-09-29 MED ORDER — SODIUM CHLORIDE 0.9 % IV SOLN
500.0000 mg | Freq: Once | INTRAVENOUS | Status: AC
  Administered 2023-09-29: 500 mg via INTRAVENOUS
  Filled 2023-09-29: qty 5

## 2023-09-29 MED ORDER — SODIUM CHLORIDE 0.9 % IV BOLUS
1000.0000 mL | Freq: Once | INTRAVENOUS | Status: AC
  Administered 2023-09-30: 1000 mL via INTRAVENOUS

## 2023-09-29 MED ORDER — SODIUM CHLORIDE 0.9 % IV SOLN: INTRAVENOUS | Status: AC

## 2023-09-29 MED ORDER — SODIUM CHLORIDE 0.9 % IV SOLN: 200.0000 mg | Freq: Once | INTRAVENOUS | Status: DC

## 2023-09-29 MED ORDER — DIAZEPAM 5 MG PO TABS
5.0000 mg | ORAL_TABLET | Freq: Two times a day (BID) | ORAL | Status: DC
  Administered 2023-09-29 – 2023-10-12 (×25): 5 mg via ORAL
  Filled 2023-09-29 (×25): qty 1

## 2023-09-29 MED ORDER — STROKE: EARLY STAGES OF RECOVERY BOOK
Freq: Once | Status: AC
  Filled 2023-09-29: qty 1

## 2023-09-29 MED ORDER — ACETAMINOPHEN 325 MG PO TABS
650.0000 mg | ORAL_TABLET | ORAL | Status: DC | PRN
  Administered 2023-10-02: 650 mg via ORAL
  Filled 2023-09-29: qty 2

## 2023-09-29 MED ORDER — SODIUM CHLORIDE 0.9 % IV SOLN
1.0000 g | INTRAVENOUS | Status: DC
  Administered 2023-09-30: 1 g via INTRAVENOUS
  Filled 2023-09-29: qty 10

## 2023-09-29 MED ORDER — THIAMINE MONONITRATE 100 MG PO TABS
100.0000 mg | ORAL_TABLET | Freq: Every day | ORAL | Status: DC
  Administered 2023-10-01 – 2023-10-12 (×12): 100 mg via ORAL
  Filled 2023-09-29 (×12): qty 1

## 2023-09-29 MED ORDER — CLOPIDOGREL BISULFATE 75 MG PO TABS
75.0000 mg | ORAL_TABLET | Freq: Every day | ORAL | Status: DC
  Administered 2023-10-01 – 2023-10-12 (×12): 75 mg via ORAL
  Filled 2023-09-29 (×12): qty 1

## 2023-09-29 MED ORDER — TIZANIDINE HCL 4 MG PO TABS: 4.0000 mg | ORAL_TABLET | Freq: Four times a day (QID) | ORAL | Status: DC | PRN

## 2023-09-29 MED ORDER — POLYETHYL GLYCOL-PROPYL GLYCOL 0.4-0.3 % OP GEL: Freq: Four times a day (QID) | OPHTHALMIC | Status: DC | PRN

## 2023-09-29 MED ORDER — ACETAMINOPHEN 650 MG RE SUPP: 650.0000 mg | RECTAL | Status: DC | PRN

## 2023-09-29 MED ORDER — SENNOSIDES-DOCUSATE SODIUM 8.6-50 MG PO TABS: 1.0000 | ORAL_TABLET | Freq: Every evening | ORAL | Status: DC | PRN

## 2023-09-29 MED ORDER — ASPIRIN 325 MG PO TABS
325.0000 mg | ORAL_TABLET | Freq: Every day | ORAL | Status: DC
  Administered 2023-10-01 – 2023-10-12 (×12): 325 mg via ORAL
  Filled 2023-09-29 (×12): qty 1

## 2023-09-29 MED ORDER — HYDROCODONE-ACETAMINOPHEN 7.5-325 MG PO TABS
1.0000 | ORAL_TABLET | Freq: Two times a day (BID) | ORAL | Status: DC
Start: 2023-09-29 — End: 2023-10-12
  Administered 2023-09-30 – 2023-10-12 (×25): 1 via ORAL
  Filled 2023-09-29 (×25): qty 1

## 2023-09-29 MED ORDER — SODIUM CHLORIDE 0.9 % IV SOLN
100.0000 mg | Freq: Once | INTRAVENOUS | Status: AC
  Administered 2023-09-30: 100 mg via INTRAVENOUS
  Filled 2023-09-29: qty 20

## 2023-09-29 MED ORDER — POTASSIUM CHLORIDE CRYS ER 20 MEQ PO TBCR
20.0000 meq | EXTENDED_RELEASE_TABLET | Freq: Every evening | ORAL | Status: DC
  Administered 2023-09-29 – 2023-10-04 (×6): 20 meq via ORAL
  Filled 2023-09-29 (×6): qty 1

## 2023-09-29 MED ORDER — ACETAMINOPHEN 325 MG PO TABS: 650.0000 mg | ORAL_TABLET | Freq: Four times a day (QID) | ORAL | Status: DC | PRN

## 2023-09-29 MED ORDER — DEXAMETHASONE 4 MG PO TABS
6.0000 mg | ORAL_TABLET | ORAL | Status: DC
  Administered 2023-09-30: 6 mg via ORAL
  Filled 2023-09-29: qty 2

## 2023-09-29 MED ORDER — LEVOTHYROXINE SODIUM 75 MCG PO TABS
75.0000 ug | ORAL_TABLET | Freq: Every morning | ORAL | Status: DC
  Administered 2023-09-30 – 2023-10-12 (×13): 75 ug via ORAL
  Filled 2023-09-29: qty 2
  Filled 2023-09-29 (×12): qty 1

## 2023-09-29 MED ORDER — LOPERAMIDE HCL 2 MG PO CAPS
2.0000 mg | ORAL_CAPSULE | Freq: Four times a day (QID) | ORAL | Status: DC | PRN
  Administered 2023-10-03: 2 mg via ORAL

## 2023-09-29 MED ORDER — HEPARIN SODIUM (PORCINE) 5000 UNIT/ML IJ SOLN
5000.0000 [IU] | Freq: Three times a day (TID) | INTRAMUSCULAR | Status: DC
  Administered 2023-09-30 – 2023-10-12 (×38): 5000 [IU] via SUBCUTANEOUS
  Filled 2023-09-29 (×39): qty 1

## 2023-09-29 NOTE — Assessment & Plan Note (Signed)
 Patient with good urine output.  Plan Avoid nephrotoxic medications/drugs

## 2023-09-29 NOTE — Assessment & Plan Note (Signed)
On replacement. No TSH last 12 months+  Plan Continue replacement dose  TSH with adjustments to dose as indicated

## 2023-09-29 NOTE — Assessment & Plan Note (Signed)
 Nephrostomy tube appears to be in position although dressing is coming off and wet. No erythema at tube site  Plan Routine nephrostomy care

## 2023-09-29 NOTE — Subjective & Objective (Signed)
 [name], an 82 y/o with multiple co-morbidities including, MS, CKD4, neurogenic bladder with superpubic cystostomy catheter, obstructed left ureter with nephrostomy tube, COPD/Emphysema on home oxygen , hypothyroidism on replacement, dysphagia, non-healing wound right ankle in setting of PAD, HFpEF, cerebral vascular disease. Over the past several days he has been weaker, more short of breath, coughing, left-sided LE weakness, increased slurred speech. He denies having fever, N/V, diarrhea other than his chronic loose stools. He was here to fore able to manage many of his ADLs including bathing, eating, taking his medications. Due to his increased weakness, slurred speech, shortness of breath EMS was activated and he was brought to [facility] for evaluation.

## 2023-09-29 NOTE — ED Provider Notes (Signed)
 82 year old male presenting to the emergency department today with hypoxia found to have pneumonia on x-ray.  The patient is covered with antibiotics.  He is COVID-positive.  I did add on Decadron .  Plan is for follow-up on MRI scan and to have the patient admitted.  He is outside the window for thrombolytics at this time.  Physical Exam  BP (!) 88/55 (BP Location: Right Arm)   Pulse 85   Temp 98.2 F (36.8 C) (Oral)   Resp (!) 25   Ht 5' 5 (1.651 m)   Wt 50.8 kg   SpO2 (!) 88%   BMI 18.64 kg/m   Physical Exam  Procedures  Procedures  ED Course / MDM   Clinical Course as of 09/29/23 1645  Tue Sep 29, 2023  1504 Basic metabolic panel(!) AKI [TY]  1505 DG Chest Portable 1 View Appears to have right-sided pneumonia.will start antibiotics [TY]  1535 SARS Coronavirus 2 by RT PCR(!): POSITIVE [TY]  1535 Troponin I (High Sensitivity)(!): 46 [TY]  1536 B Natriuretic Peptide(!): 488.0 [TY]    Clinical Course User Index [TY] Neysa Caron PARAS, DO   Medical Decision Making Amount and/or Complexity of Data Reviewed Labs: ordered. Decision-making details documented in ED Course. Radiology: ordered. Decision-making details documented in ED Course.  Risk OTC drugs. Prescription drug management.   The patient's ride does show 3 small areas of acute stroke.  I did confirm with the patient's family that his last known normal was over 24 hours ago.  The patient's blood pressure on reassessment here is 110/68.  Will give the patient aspirin  here.  A call was placed to the hospitalist for admission.SABRA  He is otherwise stable at this time.       Ula Prentice SAUNDERS, MD 09/29/23 [phone]

## 2023-09-29 NOTE — Assessment & Plan Note (Signed)
 Patient never had Covid vaccine. Now covid positive. CXR with multifocal pneumonia, R>L. Increased oxygen demand  Plan Continue decadron 6 mg daily  Continue supplemental oxygen - keep O2 sat >90%  Remdesivir per pharmacy

## 2023-09-29 NOTE — Assessment & Plan Note (Signed)
On home oxygen. Now with increased oxygen need due to other problems. Has not been on inhalational medications. Also with pulmonary fibrosis  Plan Continue supplemental oxygen  SABA prn

## 2023-09-29 NOTE — Assessment & Plan Note (Signed)
 Patient with no fever, no leukocytosis. U/A reveal many bacteria, >50 WBC/hpf but in setting of chronic suprapubic catheter  Plan Rocephin daily

## 2023-09-29 NOTE — H&P (Signed)
 History and Physical    Jordan Ward FMW:[id number] DOB: [birth year] DOA: 09/29/2023  DOS: the patient was seen and examined on 09/29/2023  PCP: Jordan Jordan PEDLAR, MD   Patient coming from: Home  I have personally briefly reviewed patient's old medical records in [facility]  Jordan Ward, an 82 y/o with multiple co-morbidities including, MS, CKD4, neurogenic bladder with superpubic cystostomy catheter, obstructed left ureter with nephrostomy tube, COPD/Emphysema on home oxygen , hypothyroidism on replacement, dysphagia, non-healing wound right ankle in setting of PAD, HFpEF, cerebral vascular disease. Over the past several days he has been weaker, more short of breath, coughing, left-sided LE weakness, increased slurred speech. He denies having fever, N/V, diarrhea other than his chronic loose stools. He was here to fore able to manage many of his ADLs including bathing, eating, taking his medications. Due to his increased weakness, slurred speech, shortness of breath EMS was activated and he was brought to [facility] for evaluation.    ED Course: T 98.2  94/66  HR 79  RR 20. A very thin, chronically ill appearing man in no distress. Able to give history and state his care wishes. Lab: glucose 144, BUN/Cr 60/3.97 (baseline 26/2.0), BNP 488, Troponin 46 to 35, C BC nl, U/A cloud, Large LE, many baceria, > 50 WBC/hpf. CXR - multifocal pneumonia R>L, advanced emphysema. MRI - p8untate infarcts left cerebeller, right and left occipital lobes, multiple vascular stenosis and occlusin right [place] group.   In ED patient given Rocephin  1g, decadron . EDP consulted Neurology. TRH asked to admit for medical management covid pneumonia and other co-morbidities.   Review of Systems:  Review of Systems  Constitutional:  Negative for chills, fever and weight loss.  HENT: Negative.    Eyes: Negative.   Respiratory:  Positive for cough and shortness of breath. Negative for sputum production and wheezing.   Cardiovascular:   Negative for chest pain, palpitations and orthopnea.  Gastrointestinal:  Negative for abdominal pain, nausea and vomiting.  Genitourinary:  Negative for dysuria, flank pain and hematuria.  Musculoskeletal:  Positive for back pain and myalgias.  Skin: Negative.   Neurological:  Positive for focal weakness and headaches. Negative for seizures and loss of consciousness.  Psychiatric/Behavioral:  Positive for depression. The patient is nervous/anxious.     Past Medical History:  Diagnosis Date   Anemia    Arthritis    Back pain, chronic    Bilateral carotid bruits    C. difficile colitis 09/2011   CAD (coronary artery disease)    Carotid artery stenosis    Cerebrovascular disease    Cerebrovascular disease 08/17/2018   Colon polyps    Complication of cystostomy catheter, initial encounter (HCC) 04/20/2020   Dyslipidemia    Dysphagia 10/07/2011   Encephalopathy    Gait disorder    HA (headache)    High grade dysplasia in colonic adenoma 09/2005   History of kidney stones    HTN (hypertension), malignant 10/06/2011   Hypernatremia    Hypokalemia    Hypothyroidism 10/08/2011   Insomnia    Junctional rhythm    Kidney stones    MS (multiple sclerosis) (HCC)    Nausea and vomiting 03/02/2020   Neuromuscular disorder (HCC)    MS   OSA (obstructive sleep apnea)    Paroxysmal atrial tachycardia (HCC)    Peripheral vascular disease (HCC)    Pneumonia 4 yrs ago   Pulmonary fibrosis (HCC) 10/06/2011   Pulmonary nodule 10/08/2011   PVD (peripheral vascular disease) (HCC)  Sacral ulcer (HCC)    Sleep apnea    cannot tolerate   Stroke [facility])    left sided weakness   Suprapubic catheter (HCC)    TIA (transient ischemic attack)    Tremors of nervous system 10/08/2011   Urinary tract infection    Ventral hernia without obstruction or gangrene     Large 8X9cm ventral hernia with loss of domain. CT reads report as diastasis recti with herniation or diastasis recti.  Jordan Ward,  Surgery, reviewed CT with radiology and there is herniation with only hernia sac or peritoneum over the bowel and large separation of the rectus muslce (i.e. diastasis recti aka loss of domain).  No surgical intervention recommended given size, age, and health.     Past Surgical History:  Procedure Laterality Date   APPENDECTOMY  09/2005   at time of left hemicolectomy   BACK SURGERY  1976/1979   lower   BILIARY DILATION N/A 03/03/2020   Procedure: BILIARY DILATION;  Surgeon: Jordan Jordan PENNER, MD;  [facility];  Service: Endoscopy;  Laterality: N/A;   CATARACT EXTRACTION W/PHACO Right 03/08/2018   Procedure: CATARACT EXTRACTION PHACO AND INTRAOCULAR LENS PLACEMENT RIGHT EYE;  Surgeon: Jordan Hamilton, MD;  Location: [facility];  Service: Ophthalmology;  Laterality: Right;  CDE: 8.86   CATARACT EXTRACTION W/PHACO Left 04/05/2018   Procedure: CATARACT EXTRACTION PHACO AND INTRAOCULAR LENS PLACEMENT (IOC);  Surgeon: Jordan Hamilton, MD;  Location: [facility];  Service: Ophthalmology;  Laterality: Left;  CDE: 7.36   CENTRAL LINE INSERTION-TUNNELED Right 09/11/2020   Procedure: PLACEMENT OF TUNNELED CENTRAL LINE INTO JUGULAR VEIN;  Surgeon: Ward Jordan BROCKS, MD;  Location: [facility];  Service: General;  Laterality: Right;   CHOLECYSTECTOMY     Dr. Claudene   COLON SURGERY  09/2005   Fleishman: four tubular adenomas, large adenomatous polyp with HIGH GRADE dysplasia   COLONOSCOPY  11/2004   Jordan Ward sessile polyp splenic flexure, 10mm sessile polyp desc colon, tubulovillous adenoma (bx not removed)   COLONOSCOPY  01/2005   poor prep, polyp could not be found   COLONOSCOPY  05/2005   with EMR, polypectomy Jordan Ward, bx showed high grade dysplasia, partially resected   COLONOSCOPY  09/2005   Jordan Ward, [place] ink tattooing, four villous colon polyp (3 had been missed on previous colonoscopies due to limitations of procedures   COLONOSCOPY  09/2006   normal TI, no polyps   COLONOSCOPY  10/2007    Dr. Randol distal mammillations, benign bx, normal TI, random bx neg for microscopic colitis   CYSTOSCOPY W/ URETERAL STENT PLACEMENT Left 09/05/2020   Procedure: CYSTOSCOPY WITH RETROGRADE PYELOGRAM/URETERAL STENT PLACEMENT;  Surgeon: Cam Morene ORN, MD;  Location: [facility];  Service: Urology;  Laterality: Left;   CYSTOSCOPY WITH LITHOLAPAXY N/A 07/27/2018   Procedure: CYSTOSCOPY WITH LITHOLAPAXY VIA  SUPRAPUBIC TUBE;  Surgeon: Matilda Senior, MD;  Location: [facility];  Service: Urology;  Laterality: N/A;   CYSTOSCOPY WITH RETROGRADE PYELOGRAM, URETEROSCOPY AND STENT PLACEMENT Left 06/09/2017   Procedure: CYSTOSCOPY WITH LEFT RETROGRADE PYELOGRAM, LEFT URETEROSCOPY, LEFT URETEROSCOPIC STONE EXTRACTION, LEFT URETERAL STENT PLACEMENT;  Surgeon: Matilda Senior, MD;  Location: [facility];  Service: Urology;  Laterality: Left;   CYSTOSCOPY/URETEROSCOPY/HOLMIUM LASER/STENT PLACEMENT Left 05/22/2020   Procedure: CYSTOSCOPY/URETEROSCOPY/STENT PLACEMENT;  Surgeon: Matilda Senior, MD;  Location: [facility];  Service: Urology;  Laterality: Left;   ERCP N/A 03/03/2020   Procedure: ENDOSCOPIC RETROGRADE CHOLANGIOPANCREATOGRAPHY (ERCP);  Surgeon: Jordan Jordan PENNER, MD;  [facility];  Service: Endoscopy;  Laterality: N/A;   ERCP N/A 04/20/2020   Procedure: ENDOSCOPIC RETROGRADE CHOLANGIOPANCREATOGRAPHY (ERCP);  Surgeon: Jordan Jordan PENNER, MD;  [facility];  Service: Endoscopy;  Laterality: N/A;  to be done at 7:30am in OR   GASTROINTESTINAL STENT REMOVAL N/A 04/20/2020   Procedure: STENT REMOVAL;  Surgeon: Jordan Jordan PENNER, MD;  [facility];  Service: Endoscopy;  Laterality: N/A;   INGUINAL HERNIA REPAIR  1971   bilateral   INSERTION OF SUPRAPUBIC CATHETER  06/09/2017   Procedure: EXCHANGE OF SUPRAPUBIC CATHETER;  Surgeon: Matilda Senior, MD;  Location: [facility];  Service: Urology;;   INSERTION OF SUPRAPUBIC CATHETER  05/22/2020   Procedure: SUPRAPUBIC CATHETER EXCHANGE;   Surgeon: Matilda Senior, MD;  [facility]: [facility];  Service: Urology;;   IR NEPHROSTOMY EXCHANGE LEFT  03/25/2021   IR NEPHROSTOMY EXCHANGE LEFT  05/20/2021   IR NEPHROSTOMY EXCHANGE LEFT  07/13/2021   IR NEPHROSTOMY EXCHANGE LEFT  09/09/2021   IR NEPHROSTOMY EXCHANGE LEFT  11/04/2021   IR NEPHROSTOMY EXCHANGE LEFT  12/23/2021   IR NEPHROSTOMY EXCHANGE LEFT  02/17/2022   IR NEPHROSTOMY EXCHANGE LEFT  04/14/2022   IR NEPHROSTOMY EXCHANGE LEFT  06/09/2022   IR NEPHROSTOMY EXCHANGE LEFT  07/28/2022   IR NEPHROSTOMY EXCHANGE LEFT  09/09/2022   IR NEPHROSTOMY EXCHANGE LEFT  11/04/2022   IR NEPHROSTOMY EXCHANGE LEFT  12/30/2022   IR NEPHROSTOMY EXCHANGE LEFT  02/25/2023   IR NEPHROSTOMY EXCHANGE LEFT  03/31/2023   IR NEPHROSTOMY EXCHANGE LEFT  05/26/2023   IR NEPHROSTOMY EXCHANGE LEFT  07/21/2023   IR NEPHROSTOMY EXCHANGE LEFT  09/15/2023   IR NEPHROSTOMY PLACEMENT LEFT  05/26/2017   IR NEPHROSTOMY PLACEMENT LEFT  05/02/2020   IR NEPHROSTOMY PLACEMENT LEFT  02/15/2021   IR URETERAL STENT PERC REMOVAL MOD SED  03/25/2021   KIDNEY STONE SURGERY  09/13/2015   LITHOTRIPSY N/A 03/03/2020   Procedure: MECHANICAL LITHOTRIPSY WITH REMOVAL OF MULTIPLE STONE FRAGMENTS;  Surgeon: Jordan Jordan PENNER, MD;  [facility];  Service: Endoscopy;  Laterality: N/A;   NEPHROLITHOTOMY Left 09/13/2015   Procedure: LEFT PERCUTANEOUS NEPHROLITHOTOMY ;  Surgeon: Senior Matilda, MD;  Location: [facility];  Service: Urology;  Laterality: Left;   NEPHROSTOMY TUBE REMOVAL  05/22/2020   Procedure: NEPHROSTOMY TUBE REMOVAL;  Surgeon: Matilda Senior, MD;  Location: [facility];  Service: Urology;;   [facility] REMOVAL Right 09/20/2020   Procedure: MINOR REMOVAL CENTRAL LINE;  Surgeon: Ward Jordan BROCKS, MD;  Location: [facility];  Service: General;  Laterality: Right;  Pt to arrive at 7:30am for procedure   REMOVAL OF STONES N/A 04/20/2020   Procedure: REMOVAL OF STONES;  Surgeon: Jordan Jordan PENNER, MD;  [facility];  Service:  Endoscopy;  Laterality: N/A;   SPHINCTEROTOMY  03/03/2020   Procedure: BILLARY SPHINCTEROTOMY;  Surgeon: Jordan Jordan PENNER, MD;  [facility];  Service: Endoscopy;;   SUPRAPUBIC CATHETER INSERTION      Soc Hx - married, has two daughters, lives with his wife. Has HH twice a week. Retired education officer, environmental   reports that he quit smoking about 35 years ago. His smoking use included cigarettes. He started smoking about 60 years ago. He has a 25 pack-year smoking history. He has never used smokeless tobacco. He reports that he does not drink alcohol  and does not use drugs.  Allergies  Allergen Reactions   Tetracyclines & Related Anaphylaxis and Rash   Cipro  [Ciprofloxacin  Hcl] Other (See Comments)  Clemens out in the floor    Family History  Problem Relation Age of Onset   Cirrhosis Brother        etoh   Stroke Mother 19   Coronary artery disease Father 33   Heart attack Brother    Cancer Sister    Multiple sclerosis Other    Colon cancer Neg Hx     Prior to Admission medications   Medication Sig Start Date End Date Taking? Authorizing Provider  acetaminophen  (TYLENOL ) 325 MG tablet Take 650 mg by mouth every 6 (six) hours as needed for mild pain, moderate pain, fever or headache.    [provider]  amLODipine  (NORVASC ) 10 MG tablet Take 5 mg by mouth every evening. After supper. 05/21/21   [provider]  Carboxymethylcellulose Sodium (DRY EYE RELIEF OP) Place 1 drop into both eyes 4 (four) times daily as needed (dry eyes).    [provider]  diazepam  (VALIUM ) 5 MG tablet Take 1 tablet (5 mg total) by mouth 2 (two) times daily. 08/28/22   Cheryle Page, MD  HYDROcodone -acetaminophen  (NORCO) 7.5-325 MG tablet Take 1 tablet by mouth 2 (two) times daily. Max APAP 3 GM IN 24 HOURS FROM ALL SOURCES 08/28/22   Cheryle Page, MD  levofloxacin  (LEVAQUIN ) 500 MG tablet Take 1 as directed 04/07/23   Matilda Senior, MD  levothyroxine  (SYNTHROID ) 75 MCG  tablet Take 75 mcg by mouth in the morning. 07/02/21   [provider]  loperamide  (IMODIUM  A-D) 2 MG tablet Take 1 tablet (2 mg total) by mouth 4 (four) times daily as needed for diarrhea or loose stools. 05/22/22   Maree, Pratik D, DO  OXYGEN  Inhale 1 L into the lungs every evening.    [provider]  potassium chloride  SA (KLOR-CON ) 20 MEQ tablet Take 20 mEq by mouth every evening. After supper 07/02/21   [provider]  tiZANidine  (ZANAFLEX ) 4 MG tablet Take 1 tablet (4 mg total) by mouth every 6 (six) hours as needed for muscle spasms. Do not take with alcohol  or while driving or operating heavy machinery.  May cause drowsiness. 10/13/22   Chandra Harlene LABOR, NP    Physical Exam: Vitals:   09/29/23 2130 09/29/23 2145 09/29/23 2200 09/29/23 2215  BP: (!) 85/61 (!) 88/67 (!) 84/62 (!) 89/64  Pulse:  71 73 73  Resp: (!) 21 17 19  (!) 25  Temp:      TempSrc:      SpO2:  93% 94% 92%  Weight:      Height:        Physical Exam Vitals and nursing note reviewed.  Constitutional:      Comments: Very underweight  HENT:     Head: Atraumatic.     Comments: Temporal wasting noted    Mouth/Throat:     Mouth: Mucous membranes are moist.     Pharynx: Oropharynx is clear. No oropharyngeal exudate.  Eyes:     Extraocular Movements: Extraocular movements intact.     Conjunctiva/sclera: Conjunctivae normal.     Pupils: Pupils are equal, round, and reactive to light.  Cardiovascular:     Rate and Rhythm: Normal rate and regular rhythm.     Pulses: Normal pulses.     Heart sounds: No murmur heard.    No gallop.     Comments: Heart sounds very distant,best appreciated at diaphragm. No mm. Pulmonary:     Effort: Pulmonary effort is normal.     Breath sounds: Rales present.  Comments: Decreased breath sounds. Shallow inspirations. Using accessory muscles - mild. Rales at right base Abdominal:     General: Bowel sounds are normal.     Palpations: Abdomen is soft.      Tenderness: There is no abdominal tenderness. There is no guarding.     Hernia: A hernia is present.     Comments: Scaphoid, ventral hernia. Suprapubic catheter in place - appears clear, w/o erythema.  Left nephrostomy tube in place - site normal  Musculoskeletal:        General: No deformity. Normal range of motion.     Cervical back: Normal range of motion and neck supple. No rigidity.     Right lower leg: No edema.     Left lower leg: No edema.  Lymphadenopathy:     Cervical: No cervical adenopathy.  Skin:    General: Skin is warm and dry.     Coloration: Skin is not jaundiced.     Findings: Lesion present.     Comments: 2cm wound, round with clean base right lateral ankle at malleolus  Neurological:     Mental Status: He is alert and oriented to person, place, and time.     Comments: CN- slight right facial droop and immobility. No deviation tongue. MS - good grip strength bilaterally. Nl proximal and distal strength R LE, unable to hold up Left LE or offer resistance.  Sensation - grossly nl Not stood or ambulated  Psychiatric:        Mood and Affect: Mood normal.        Behavior: Behavior normal.      Labs on Admission: I have personally reviewed following labs and imaging studies  CBC: Recent Labs  Lab 09/29/23 1355  WBC 6.5  HGB 11.9*  HCT 36.9*  MCV 92.9  PLT 136*   Basic Metabolic Panel: Recent Labs  Lab 09/29/23 1355  NA 140  K 3.8  CL 109  CO2 21*  GLUCOSE 144*  BUN 60*  CREATININE 3.97*  CALCIUM  8.6*   GFR: Estimated Creatinine Clearance: 10.7 mL/min (A) (by C-G formula based on SCr of 3.97 mg/dL (H)). Liver Function Tests: Recent Labs  Lab 09/29/23 1411  AST 34  ALT 19  ALKPHOS 69  BILITOT 0.7  PROT 7.4  ALBUMIN 3.0*   Recent Labs  Lab 09/29/23 1411  LIPASE 21   No results for input(s): AMMONIA in the last 168 hours. Coagulation Profile: No results for input(s): INR, PROTIME in the last 168 hours. Cardiac Enzymes: No  results for input(s): CKTOTAL, CKMB, CKMBINDEX, TROPONINI in the last 168 hours. BNP (last 3 results) No results for input(s): PROBNP in the last 8760 hours. HbA1C: No results for input(s): HGBA1C in the last 72 hours. CBG: Recent Labs  Lab 09/29/23 1402  GLUCAP 139*   Lipid Profile: No results for input(s): CHOL, HDL, LDLCALC, TRIG, CHOLHDL, LDLDIRECT in the last 72 hours. Thyroid  Function Tests: No results for input(s): TSH, T4TOTAL, FREET4, T3FREE, THYROIDAB in the last 72 hours. Anemia Panel: No results for input(s): VITAMINB12, FOLATE, FERRITIN, TIBC, IRON, RETICCTPCT in the last 72 hours. Urine analysis:    Component Value Date/Time   COLORURINE YELLOW 09/29/2023 1430   APPEARANCEUR CLOUDY (A) 09/29/2023 1430   LABSPEC 1.015 09/29/2023 1430   PHURINE 6.0 09/29/2023 1430   GLUCOSEU NEGATIVE 09/29/2023 1430   HGBUR MODERATE (A) 09/29/2023 1430   BILIRUBINUR NEGATIVE 09/29/2023 1430   BILIRUBINUR small (A) 10/26/2021 1538   KETONESUR NEGATIVE 09/29/2023 1430  PROTEINUR 100 (A) 09/29/2023 1430   UROBILINOGEN 0.2 10/26/2021 1538   UROBILINOGEN 0.2 07/17/2014 1215   NITRITE NEGATIVE 09/29/2023 1430   LEUKOCYTESUR LARGE (A) 09/29/2023 1430    Radiological Exams on Admission: I have personally reviewed images MR BRAIN WO CONTRAST Result Date: 09/29/2023 CLINICAL DATA:  Provided history: Stroke, follow-up. Additional history obtained from electronic MEDICAL RECORD NUMBERHistory of multiple sclerosis. EXAM: MRI HEAD WITHOUT CONTRAST MRA HEAD WITHOUT CONTRAST TECHNIQUE: Multiplanar, multi-echo pulse sequences of the brain and surrounding structures were acquired without intravenous contrast. Angiographic images of the Circle of Willis were acquired using MRA technique without intravenous contrast. COMPARISON:  Head CT 08/24/2022. Brain MRI 10/03/2011. Report from MRA head 09/30/2001 (images unavailable). FINDINGS: MRI HEAD FINDINGS Brain:  Mild-to-moderate cerebral atrophy. Punctate acute infarct within the right occipital lobe near the gray-white junction (PCA vascular territory) (series 5, image 21). Punctate acute infarct within the left occipital lobe white matter (PCA territory) (series 5, image 18). 14 mm acute infarct within the left cerebellar hemisphere (series 5, image 7). Multiple chronic lacunar infarcts again noted within the bilateral cerebral hemispheric white matter and within/about the bilateral deep gray nuclei. Chronic hemosiderin deposition associated with some of these infarcts. Background advanced patchy and confluent T2 FLAIR hyperintense signal abnormality within the cerebral white matter, likely reflecting a combination of chronic small ischemic disease and demyelinating disease given patient's age and history of multiple sclerosis. No cortical encephalomalacia is identified. No evidence of an intracranial mass. No extra-axial fluid collection. No midline shift. Vascular: Reported below under the MRA section. Skull and upper cervical spine: No focal worrisome marrow lesion. Incompletely assessed cervical spondylosis. Sinuses/Orbits: No mass or acute finding within the imaged orbits. Prior bilateral ocular lens replacement. Minimal mucosal thickening within the bilateral ethmoid and right maxillary sinuses. MRA HEAD FINDINGS Mildly motion degraded examination. Within this limitation, findings are as follows. Anterior circulation: The intracranial internal carotid arteries are patent. Atherosclerotic irregularity of both vessels. Moderate/severe focal stenosis within the cavernous left ICA. No more than mild stenosis of the intracranial internal carotid arteries elsewhere. The M1 middle cerebral arteries are patent. Atherosclerotic irregularity of the M2 and more distal MCA vessels, bilaterally. No M2 proximal branch occlusion or high-grade proximal M2 stenosis is identified. The anterior cerebral arteries are patent. Moderate  stenosis within the left anterior cerebral artery proximal A2 segment. 3 mm focus of flow-related signal along the medial aspect of the cavernous left internal carotid artery suspicious for an aneurysm (for instance as seen on series 12, image 70). 3 mm inferomedially projecting vascular protrusion arising from the cavernous left internal carotid artery more distally, also suspicious for an aneurysm (for instance as seen on series 1021, image 13). Posterior circulation: Sites of occlusion within the V4 right vertebral artery, by report new from the prior MRA head of 09/30/2001. Flow related signal is present within the proximal right PICA. The visualized intracranial left vertebral artery is patent without stenosis. The basilar artery is patent. The posterior cerebral arteries are patent. Atherosclerotic irregularity of both vessels. Most notably, there is a severe stenosis within a left PCA branch at the P3/P4 junction (series 1033, image 3). Posterior communicating arteries are diminutive or absent, bilaterally. Anatomic variants: As described. IMPRESSION: MRI brain: 1. Punctate acute infarct within the right occipital lobe (PCA vascular territory). 2. Punctate acute infarct within the left occipital lobe (PCA vascular territory). 3. 14 mm acute infarct within the left cerebellar hemisphere. 4. Chronic lacunar infarcts again noted within the bilateral cerebral  hemispheric white matter and within/about the bilateral deep gray nuclei. 5. Background advanced cerebral white matter disease which has progressed from the prior brain MRI of 10/03/2011. Findings likely reflect a combination of chronic small vessel ischemic disease and demyelinating disease given the patient's age and history of multiple sclerosis. 6. Mild-to-moderate cerebral atrophy. MRA head: 1. Mildly motion degraded exam. 2. Sites of occlusion within the intracranial right vertebral artery which, by report, are new from the prior MRA head of  09/30/2001. 3. Background intracranial atherosclerotic disease with multifocal stenoses, most notably as follows. 4. Moderate/severe focal stenosis within the cavernous left internal carotid artery. 5. Moderate stenosis within the left anterior cerebral artery proximal A2 segment. 6. Severe stenosis within a left posterior cerebral artery branch at the P3/P4 junction. 7. Two 3 mm foci along the cavernous left internal carotid artery, as described and suspicious for arterial aneurysm. A CTA of the head is recommended for further evaluation. Electronically Signed   By: Rockey Childs D.O.   On: 09/29/2023 19:13   MR ANGIO HEAD WO CONTRAST Result Date: 09/29/2023 CLINICAL DATA:  Provided history: Stroke, follow-up. Additional history obtained from electronic MEDICAL RECORD NUMBERHistory of multiple sclerosis. EXAM: MRI HEAD WITHOUT CONTRAST MRA HEAD WITHOUT CONTRAST TECHNIQUE: Multiplanar, multi-echo pulse sequences of the brain and surrounding structures were acquired without intravenous contrast. Angiographic images of the Circle of Willis were acquired using MRA technique without intravenous contrast. COMPARISON:  Head CT 08/24/2022. Brain MRI 10/03/2011. Report from MRA head 09/30/2001 (images unavailable). FINDINGS: MRI HEAD FINDINGS Brain: Mild-to-moderate cerebral atrophy. Punctate acute infarct within the right occipital lobe near the gray-white junction (PCA vascular territory) (series 5, image 21). Punctate acute infarct within the left occipital lobe white matter (PCA territory) (series 5, image 18). 14 mm acute infarct within the left cerebellar hemisphere (series 5, image 7). Multiple chronic lacunar infarcts again noted within the bilateral cerebral hemispheric white matter and within/about the bilateral deep gray nuclei. Chronic hemosiderin deposition associated with some of these infarcts. Background advanced patchy and confluent T2 FLAIR hyperintense signal abnormality within the cerebral white matter,  likely reflecting a combination of chronic small ischemic disease and demyelinating disease given patient's age and history of multiple sclerosis. No cortical encephalomalacia is identified. No evidence of an intracranial mass. No extra-axial fluid collection. No midline shift. Vascular: Reported below under the MRA section. Skull and upper cervical spine: No focal worrisome marrow lesion. Incompletely assessed cervical spondylosis. Sinuses/Orbits: No mass or acute finding within the imaged orbits. Prior bilateral ocular lens replacement. Minimal mucosal thickening within the bilateral ethmoid and right maxillary sinuses. MRA HEAD FINDINGS Mildly motion degraded examination. Within this limitation, findings are as follows. Anterior circulation: The intracranial internal carotid arteries are patent. Atherosclerotic irregularity of both vessels. Moderate/severe focal stenosis within the cavernous left ICA. No more than mild stenosis of the intracranial internal carotid arteries elsewhere. The M1 middle cerebral arteries are patent. Atherosclerotic irregularity of the M2 and more distal MCA vessels, bilaterally. No M2 proximal branch occlusion or high-grade proximal M2 stenosis is identified. The anterior cerebral arteries are patent. Moderate stenosis within the left anterior cerebral artery proximal A2 segment. 3 mm focus of flow-related signal along the medial aspect of the cavernous left internal carotid artery suspicious for an aneurysm (for instance as seen on series 12, image 70). 3 mm inferomedially projecting vascular protrusion arising from the cavernous left internal carotid artery more distally, also suspicious for an aneurysm (for instance as seen on series 1021, image 13).  Posterior circulation: Sites of occlusion within the V4 right vertebral artery, by report new from the prior MRA head of 09/30/2001. Flow related signal is present within the proximal right PICA. The visualized intracranial left  vertebral artery is patent without stenosis. The basilar artery is patent. The posterior cerebral arteries are patent. Atherosclerotic irregularity of both vessels. Most notably, there is a severe stenosis within a left PCA branch at the P3/P4 junction (series 1033, image 3). Posterior communicating arteries are diminutive or absent, bilaterally. Anatomic variants: As described. IMPRESSION: MRI brain: 1. Punctate acute infarct within the right occipital lobe (PCA vascular territory). 2. Punctate acute infarct within the left occipital lobe (PCA vascular territory). 3. 14 mm acute infarct within the left cerebellar hemisphere. 4. Chronic lacunar infarcts again noted within the bilateral cerebral hemispheric white matter and within/about the bilateral deep gray nuclei. 5. Background advanced cerebral white matter disease which has progressed from the prior brain MRI of 10/03/2011. Findings likely reflect a combination of chronic small vessel ischemic disease and demyelinating disease given the patient's age and history of multiple sclerosis. 6. Mild-to-moderate cerebral atrophy. MRA head: 1. Mildly motion degraded exam. 2. Sites of occlusion within the intracranial right vertebral artery which, by report, are new from the prior MRA head of 09/30/2001. 3. Background intracranial atherosclerotic disease with multifocal stenoses, most notably as follows. 4. Moderate/severe focal stenosis within the cavernous left internal carotid artery. 5. Moderate stenosis within the left anterior cerebral artery proximal A2 segment. 6. Severe stenosis within a left posterior cerebral artery branch at the P3/P4 junction. 7. Two 3 mm foci along the cavernous left internal carotid artery, as described and suspicious for arterial aneurysm. A CTA of the head is recommended for further evaluation. Electronically Signed   By: Rockey Childs D.O.   On: 09/29/2023 19:13   DG Chest Portable 1 View Result Date: 09/29/2023 CLINICAL DATA:   Hypoxia.  Cough.  Recent diagnosis of COVID. EXAM: PORTABLE CHEST 1 VIEW COMPARISON:  08/24/2022 FINDINGS: Unchanged cardiac silhouette and mediastinal contours with atherosclerotic plaque within the thoracic aorta. Interval development of heterogeneous airspace opacities within the right mid and bilateral lower lungs. Lungs remain hyperexpanded with thinning of the biapical pulmonary parenchyma. Trace right-sided pleural effusion is not excluded. No pneumothorax. No acute osseous abnormalities. Degenerative change of the left glenohumeral joint is suspected though incompletely evaluated. IMPRESSION: Findings worrisome for multifocal pneumonia, right-greater-than-left, superimposed on advanced emphysematous change. A follow-up chest radiograph in 3 to 4 weeks after treatment is recommended to ensure resolution. Electronically Signed   By: Jordan Roulette M.D.   On: 09/29/2023 16:06    EKG: I have personally reviewed EKG: sinus rhythm, LVH, prolonged QT, no acute STEMI changes  Assessment/Plan Principal Problem:   Pneumonia due to COVID-19 virus Active Problems:   UTI (urinary tract infection)   Cerebrovascular disease   Chronic diastolic (congestive) heart failure (HCC)   CKD (chronic kidney disease) stage 4, GFR 15-29 ml/min (HCC)   Nephrostomy present (HCC)   Emphysema lung (HCC)   Multiple sclerosis (HCC)   Chronic suprapubic catheter (HCC)   Dysphagia   Hypothyroidism   Dilated cardiomyopathy (HCC)   Essential hypertension    Assessment and Plan: * Pneumonia due to COVID-19 virus Patient never had Covid vaccine. Now covid positive. CXR with multifocal pneumonia, R>L. Increased oxygen  demand  Plan Continue decadron  6 mg daily  Continue supplemental oxygen  - keep O2 sat >90%  Remdesivir  per pharmacy  Cerebrovascular disease Patient with h/o Cerebral vascular  disease now with new left leg weakness and increased slurred speech. MRI positive for 3 small infarcts that appear new. He is more  than 24 hours out from onset of symptoms and is not a candidate for thrombolytic therapy. Neuro has not seen the patient.   Plan Tele admit-step down admit  Stroke protocol with SLP, PT/OT  Add plavix  to his regimen, continue ASA  UTI (urinary tract infection) Patient with no fever, no leukocytosis. U/A reveal many bacteria, >50 WBC/hpf but in setting of chronic suprapubic catheter  Plan Rocephin  daily  Emphysema lung (HCC) On home oxygen . Now with increased oxygen  need due to other problems. Has not been on inhalational medications. Also with pulmonary fibrosis  Plan Continue supplemental oxygen   SABA prn  Nephrostomy present (HCC) Nephrostomy tube appears to be in position although dressing is coming off and wet. No erythema at tube site  Plan Routine nephrostomy care  CKD (chronic kidney disease) stage 4, GFR 15-29 ml/min (HCC) Patient with good urine output.  Plan Avoid nephrotoxic medications/drugs  Chronic diastolic (congestive) heart failure (HCC) Patient appears well compensated. BNP mildly elevated. He has rales at the right base. NO JVD. Last Echo 09/06/20.  Plan Echo  Lasix  40 mg q12 x 2 doses, then repeat BNP  Continue amlodipine .   Essential hypertension At admission BP soft.  Plan Continue amlodipine  but hold for SBP < 90  Dilated cardiomyopathy (HCC) Appears well compensated  Plan - Echo  Hypothyroidism On replacement. No TSH last 12 months+  Plan Continue replacement dose  TSH with adjustments to dose as indicated  Dysphagia Long standing problem which he attributes to MS. Now with evidence of new CVA  Plan NPO until passes bedside swallow and SLP eval  Chronic suprapubic catheter (HCC) Catheter site appears nl. Good flow to collection bag.  Multiple sclerosis (HCC) Long term problem. Currently on no medication.  Plan No new treatments at this time       DVT prophylaxis: SQ Heparin  Code Status: Full Code discusse with patient  including probabilities of success and of surviving after resuscitation. Family Communication: no answer at spouse or daughters phone  Disposition Plan: TBD  Consults called: Neurology - EDP consulted  Admission status: Inpatient, Step Down Unit   Ozell Haggis, MD [facility] 09/29/2023, 11:18 PM

## 2023-09-29 NOTE — Assessment & Plan Note (Signed)
 Appears well compensated  Plan - Echo

## 2023-09-29 NOTE — Assessment & Plan Note (Signed)
At admission BP soft.  Plan Continue amlodipine but hold for SBP < 90

## 2023-09-29 NOTE — ED Triage Notes (Signed)
 Pt BIB ems from home d/t wife calling and saying the pt was hypoxic at 76% RA. EMS put on 3 L NS and ambulated with a walker out of bedroom to ambulance stretcher. Per EMS pt took a home test yesterday and tested positive. HH was at pt's home yesterday and heard congestion. Pt was suppose be seen at PCP today and family stated pt could not walk and wanted transported here.

## 2023-09-29 NOTE — Assessment & Plan Note (Signed)
Patient appears well compensated. BNP mildly elevated. He has rales at the right base. NO JVD. Last Echo 09/06/20.  Plan Echo  Lasix 40 mg q12 x 2 doses, then repeat BNP  Continue amlodipine.

## 2023-09-29 NOTE — Assessment & Plan Note (Signed)
 Long standing problem which he attributes to MS. Now with evidence of new CVA  Plan NPO until passes bedside swallow and SLP eval

## 2023-09-29 NOTE — Assessment & Plan Note (Signed)
Long term problem. Currently on no medication.  Plan No new treatments at this time

## 2023-09-29 NOTE — Assessment & Plan Note (Signed)
 Patient with h/o Cerebral vascular disease now with new left leg weakness and increased slurred speech. MRI positive for 3 small infarcts that appear new. He is more than 24 hours out from onset of symptoms and is not a candidate for thrombolytic therapy. Neuro has not seen the patient.   Plan Tele admit-step down admit  Stroke protocol with SLP, PT/OT  Add plavix  to his regimen, continue ASA

## 2023-09-29 NOTE — Consult Note (Signed)
 [facility] Tele[facility]urology Consult Services  Stat Consult  Patient Name:   Jordan Ward, Jordan Ward Date of Birth:   09/13/1941 Identification Number:   MRN - [id number] Date of Service:   09/29/2023 19:43:07  Diagnosis:       I63.89 - Cerebrovascular accident (CVA) due to other mechanism [facility])  Impression 82 yo man presenting for evaluation of concern for UTI but due to some other possible complaints, an MRI brain was ordered which showed some very small strokes in the posterior circulation. Suspect they are too small to have a significant clinical manifestation but due to the atherosclerosis noted, I would certainly start aspirin  and plavix , if no contraindications. Otherwise, would proceed with a typical stroke workup.   Recommendations: Our recommendations are outlined below.  Diagnostic Studies : Holter/Loop Recorder as outpatient with cardiology follow up  Laboratory Studies : Lipid panel I ordered Hemoglobin A1c TSH  Antithrombotic Medication : Initiate dual antiplatelet therapy with Aspirin  81 mg daily and Clopidogrel  75 mg daily  Nursing Recommendations : IV Fluids, avoid dextrose  containing fluids, Maintain euglycemia Neuro checks q4 hrs x 24 hrs and then per shift Head of bed 30 degrees Continue with Telemetry  Consultations : Recommend Speech therapy if failed dysphagia screen Physical therapy/Occupational therapy Inpatient rehab if recommended by physical/occupational therapy  Disposition : Neurology will follow    ----------------------------------------------------------------------------------------------------    Metrics: Dispatch Time: 09/29/2023 19:39:53 Callback Response Time: 09/29/2023 19:46:08  Primary Provider Notified of Diagnostic Impression and Management Plan on: 09/29/2023 21:56:06     ----------------------------------------------------------------------------------------------------  Chief Complaint: I'm better than I  was  History of Present Illness: Patient is a 82 year old Male. This is an 82 yo man who presented for evaluation for concern for UTI. His wife brought him in because his urine was cloudy and smelly. They were concerned for a urinary tract infection. He also has Covid and has been couging a lot with O2 sats dropping at home, as low as the 70s.  Per ER note, wife was stating that he had worsening weakness and some speech deficits so an MRI brain was obtained. This showed punctate infarcts in the bilateral occipital lobes and the left cerebellum. MRA showed a new right vertebral artery occlusion and diffuse intracranial atherosclerosis.   Past Medical History: Other PMH:  Multiple Sclerosis  Medications:  No Anticoagulant use  No Antiplatelet use Reviewed EMR for current medications  Allergies:  Reviewed  Social History: Smoking: No  Family History:  There is no family history of premature cerebrovascular disease pertinent to this consultation  ROS : 14 Points Review of Systems was performed and was negative except mentioned in HPI.  Past Surgical History: There Is No Surgical History Contributory To Today's Visit   Examination: BP(89/64), Pulse(73), 1A: Level of Consciousness - Alert; keenly responsive + 0 1B: Ask Month and Age - Both Questions Right + 0 1C: Blink Eyes & Squeeze Hands - Performs Both Tasks + 0 2: Test Horizontal Extraocular Movements - Normal + 0 3: Test Visual Fields - No Visual Loss + 0 4: Test Facial Palsy (Use Grimace if Obtunded) - Normal symmetry + 0 5A: Test Left Arm Motor Drift - No Drift for 10 Seconds + 0 5B: Test Right Arm Motor Drift - No Drift for 10 Seconds + 0 6A: Test Left Leg Motor Drift - Drift, hits bed + 2 6B: Test Right Leg Motor Drift - Drift, hits bed + 2 7: Test Limb Ataxia (FNF/Heel-Shin) - No Ataxia + 0 8: Test  Sensation - Normal; No sensory loss + 0 9: Test Language/Aphasia - Normal; No aphasia + 0 10: Test Dysarthria -  Mild-Moderate Dysarthria: Slurring but can be understood + 1 11: Test Extinction/Inattention - No abnormality + 0  NIHSS Score: 5  Spoke with : Dr. Ula    This consult was conducted in real time using interactive audio and immunologist. Patient was informed of the technology being used for this visit and agreed to proceed. Patient located in hospital and provider located at home/office setting.  Patient is being evaluated for possible acute neurologic impairment and high probability of imminent or life - threatening deterioration.I spent total of 35 minutes providing care to this patient, including time for face to face visit via telemedicine, review of medical records, imaging studies and discussion of findings with providers, the patient and / or family.   Dr Joesph Harbor   [facility] For Inpatient follow-up with [facility] physician please call [facility] at [phone]. As we are not an outpatient service for any post hospital discharge needs please contact the hospital for assistance.  If you have any questions for the [facility] physicians or need to reconsult for clinical or diagnostic changes please contact us  via [facility] at [phone].

## 2023-09-29 NOTE — ED Notes (Signed)
Neuro nurse called to inquire on pt status and reason for neuro consult- informed that MRI results show acute infarcts.

## 2023-09-29 NOTE — Assessment & Plan Note (Signed)
Catheter site appears nl. Good flow to collection bag.

## 2023-09-29 NOTE — ED Provider Notes (Signed)
 [place] EMERGENCY DEPARTMENT AT [facility] Provider Note   CSN: [id number] Arrival date & time: 09/29/23  1249     History  Chief Complaint  Patient presents with   Weakness    Jordan Ward is a 82 y.o. male.  This is a 82 year old male presenting emergency department for generalized weakness.  Reports that he has had some rhinorrhea and congestion for the past several days.  Yesterday he began having some cough shortness of breath.  No nausea vomiting.  Wife at bedside noted that he seemingly was complaining of some paresthesia to his jaw and that his speech was abnormal and complained of some numbness to his bilateral lower extremities.  She called EMS, but he refused to come to the emergency department.  Today he continued to have weakness and was able to ambulate.  Reportedly was 76% on room air at home was placed on nasal cannula.  He states he just feels generally unwell, no chest pain.   Weakness      Home Medications Prior to Admission medications   Medication Sig Start Date End Date Taking? Authorizing Provider  acetaminophen  (TYLENOL ) 325 MG tablet Take 650 mg by mouth every 6 (six) hours as needed for mild pain, moderate pain, fever or headache.    [provider]  amLODipine  (NORVASC ) 10 MG tablet Take 5 mg by mouth every evening. After supper. 05/21/21   [provider]  Carboxymethylcellulose Sodium (DRY EYE RELIEF OP) Place 1 drop into both eyes 4 (four) times daily as needed (dry eyes).    [provider]  diazepam  (VALIUM ) 5 MG tablet Take 1 tablet (5 mg total) by mouth 2 (two) times daily. 08/28/22   Cheryle Page, MD  HYDROcodone -acetaminophen  (NORCO) 7.5-325 MG tablet Take 1 tablet by mouth 2 (two) times daily. Max APAP 3 GM IN 24 HOURS FROM ALL SOURCES 08/28/22   Cheryle Page, MD  levofloxacin  (LEVAQUIN ) 500 MG tablet Take 1 as directed 04/07/23   Matilda Senior, MD  levothyroxine  (SYNTHROID ) 75 MCG tablet Take 75  mcg by mouth in the morning. 07/02/21   [provider]  loperamide  (IMODIUM  A-D) 2 MG tablet Take 1 tablet (2 mg total) by mouth 4 (four) times daily as needed for diarrhea or loose stools. 05/22/22   Maree, Pratik D, DO  OXYGEN  Inhale 1 L into the lungs every evening.    [provider]  potassium chloride  SA (KLOR-CON ) 20 MEQ tablet Take 20 mEq by mouth every evening. After supper 07/02/21   [provider]  tiZANidine  (ZANAFLEX ) 4 MG tablet Take 1 tablet (4 mg total) by mouth every 6 (six) hours as needed for muscle spasms. Do not take with alcohol  or while driving or operating heavy machinery.  May cause drowsiness. 10/13/22   Chandra Harlene LABOR, NP      Allergies    Tetracyclines & related and Cipro  [ciprofloxacin  hcl]    Review of Systems   Review of Systems  Neurological:  Positive for weakness.    Physical Exam Updated Vital Signs BP (!) 88/55 (BP Location: Right Arm)   Pulse 85   Temp 98.2 F (36.8 C) (Oral)   Resp (!) 25   Ht 5' 5 (1.651 m)   Wt 50.8 kg   SpO2 (!) 88%   BMI 18.64 kg/m  Physical Exam Vitals and nursing note reviewed.  Constitutional:      General: He is not in acute distress.    Comments: Chronically ill-appearing  HENT:     Head: Normocephalic.     Mouth/Throat:     Mouth: Mucous membranes are moist.  Eyes:     Pupils: Pupils are equal, round, and reactive to light.  Cardiovascular:     Rate and Rhythm: Normal rate and regular rhythm.     Pulses: Normal pulses.  Pulmonary:     Comments: Coarse breath sounds throughout lung fields.  Nonlabored breathing. Abdominal:     General: Abdomen is flat. There is no distension.     Tenderness: There is no abdominal tenderness. There is no guarding or rebound.     Comments: Nephrostomy and Foley in place  Musculoskeletal:     Comments: Has a small ulcerative wound to his right ankle, does have an odor, but no surrounding erythema or purulent drainage  Skin:    General: Skin is  warm.     Capillary Refill: Capillary refill takes less than 2 seconds.  Neurological:     Mental Status: He is alert and oriented to person, place, and time.     Comments: Patient with some minor left lower facial droop, does have some dysarthria.  No aphasia.  No motor deficits or paresthesias.  Psychiatric:        Mood and Affect: Mood normal.        Behavior: Behavior normal.     ED Results / Procedures / Treatments   Labs (all labs ordered are listed, but only abnormal results are displayed) Labs Reviewed  RESP PANEL BY RT-PCR (RSV, FLU A&B, COVID)  RVPGX2 - Abnormal; Notable for the following components:      Result Value   SARS Coronavirus 2 by RT PCR POSITIVE (*)    All other components within normal limits  BASIC METABOLIC PANEL - Abnormal; Notable for the following components:   CO2 21 (*)    Glucose, Bld 144 (*)    BUN 60 (*)    Creatinine, Ser 3.97 (*)    Calcium  8.6 (*)    GFR, Estimated 15 (*)    All other components within normal limits  CBC - Abnormal; Notable for the following components:   RBC 3.97 (*)    Hemoglobin 11.9 (*)    HCT 36.9 (*)    RDW 15.6 (*)    Platelets 136 (*)    All other components within normal limits  URINALYSIS, ROUTINE W REFLEX MICROSCOPIC - Abnormal; Notable for the following components:   APPearance CLOUDY (*)    Hgb urine dipstick MODERATE (*)    Protein, ur 100 (*)    Leukocytes,Ua LARGE (*)    Bacteria, UA MANY (*)    All other components within normal limits  HEPATIC FUNCTION PANEL - Abnormal; Notable for the following components:   Albumin 3.0 (*)    Bilirubin, Direct 0.3 (*)    All other components within normal limits  BRAIN NATRIURETIC PEPTIDE - Abnormal; Notable for the following components:   B Natriuretic Peptide 488.0 (*)    All other components within normal limits  CBG MONITORING, ED - Abnormal; Notable for the following components:   Glucose-Capillary 139 (*)    All other components within normal limits   TROPONIN I (HIGH SENSITIVITY) - Abnormal; Notable for the following components:   Troponin I (High Sensitivity) 46 (*)    All other components within normal limits  CULTURE, BLOOD (ROUTINE X 2)  CULTURE, BLOOD (ROUTINE X 2)  LIPASE, BLOOD  TROPONIN I (HIGH SENSITIVITY)    EKG EKG Interpretation  Date/Time:  Tuesday September 29 2023 13:35:17 EST Ventricular Rate:  87 PR Interval:  127 QRS Duration:  125 QT Interval:  423 QTC Calculation: 509 R Axis:   49  Text Interpretation: Sinus rhythm Probable LVH with secondary repol abnrm Abnormal inferior Q waves Prolonged QT interval Artifact in lead(s) I III aVL Confirmed by Neysa Clap [phone]) on 09/29/2023 2:36:04 PM  Radiology No results found.  Procedures Procedures    Medications Ordered in ED Medications  cefTRIAXone  (ROCEPHIN ) 1 g in sodium chloride  0.9 % 100 mL IVPB (1 g Intravenous New Bag/Given 09/29/23 1557)  azithromycin  (ZITHROMAX ) 500 mg in sodium chloride  0.9 % 250 mL IVPB (has no administration in time range)  sodium chloride  0.9 % bolus 1,000 mL (1,000 mLs Intravenous New Bag/Given 09/29/23 1422)    ED Course/ Medical Decision Making/ A&P Clinical Course as of 09/29/23 1608  Tue Sep 29, 2023  1504 Basic metabolic panel(!) AKI [TY]  1505 DG Chest Portable 1 View Appears to have right-sided pneumonia.will start antibiotics [TY]  1535 SARS Coronavirus 2 by RT PCR(!): POSITIVE [TY]  1535 Troponin I (High Sensitivity)(!): 46 [TY]  1536 B Natriuretic Peptide(!): 488.0 [TY]    Clinical Course User Index [TY] Neysa Clap PARAS, DO                                 Medical Decision Making Is an 82 year old male presenting emergency department with seemingly multiple complaints.  He has numerous comorbid medical conditions to include MS, history of TIAs pulmonary fibrosis with intermittent use of 1 L as needed, UTI kidney stones paroxysmal A-fib.  Presented today with generalized weakness shortness of breath in  setting of URI symptoms.  Wife stating his speech is not normal.  He is dysarthric on exam, does appear to have some left lower facial droop.  Symptoms started yesterday afternoon.  Outside the window for acute stroke intervention.  MRI ordered to evaluate for stroke.  However, patient also with acute hypoxic respiratory failure.  He is COVID-positive.  Chest x-ray appears to have right lower lobe pneumonia.  Covered with Rocephin  and azithromycin .  Urine with evidence of UTI.  Does not appear to be fluid overloaded, but does have mildly elevated BNP and troponin.  EKG without ST segment changes to indicate ischemia.  Care signed out to afternoon team disposition pending further workup, but anticipate admission for acute hypoxic respiratory failure secondary to pneumonia.  Amount and/or Complexity of Data Reviewed Labs: ordered. Decision-making details documented in ED Course. Radiology: ordered. Decision-making details documented in ED Course.         Final Clinical Impression(s) / ED Diagnoses Final diagnoses:  None    Rx / DC Orders ED Discharge Orders     None         Neysa Clap PARAS, DO 09/29/23 1608

## 2023-09-30 ENCOUNTER — Inpatient Hospital Stay (HOSPITAL_COMMUNITY): Payer: Medicare Other

## 2023-09-30 DIAGNOSIS — E039 Hypothyroidism, unspecified: Secondary | ICD-10-CM | POA: Diagnosis not present

## 2023-09-30 DIAGNOSIS — Z936 Other artificial openings of urinary tract status: Secondary | ICD-10-CM | POA: Diagnosis not present

## 2023-09-30 DIAGNOSIS — U071 COVID-19: Secondary | ICD-10-CM | POA: Diagnosis not present

## 2023-09-30 DIAGNOSIS — I10 Essential (primary) hypertension: Secondary | ICD-10-CM | POA: Diagnosis not present

## 2023-09-30 DIAGNOSIS — I6389 Other cerebral infarction: Secondary | ICD-10-CM

## 2023-09-30 LAB — BLOOD CULTURE ID PANEL (REFLEXED) - BCID2

## 2023-09-30 LAB — ECHOCARDIOGRAM COMPLETE
AR max vel: 1.28 cm2
AV Area VTI: 1.17 cm2
AV Area mean vel: 1.11 cm2
AV Mean grad: 4.8 mm[Hg]
AV Peak grad: 7.5 mm[Hg]
Ao pk vel: 1.37 m/s
Area-P 1/2: 3.12 cm2
Calc EF: 31 %
Height: 65 in
S' Lateral: 4.1 cm
Single Plane A2C EF: 29.2 %
Single Plane A4C EF: 27.8 %
Weight: 1791.9 [oz_av]

## 2023-09-30 LAB — LIPID PANEL
Cholesterol: 42 mg/dL (ref 0–200)
HDL: 14 mg/dL — ABNORMAL LOW (ref >40)
LDL Cholesterol: 16 mg/dL (ref 0–99)
Total CHOL/HDL Ratio: 3 {ratio}
Triglycerides: 62 mg/dL (ref <150)
VLDL: 12 mg/dL (ref 0–40)

## 2023-09-30 LAB — TSH: TSH: 6.285 u[IU]/mL — ABNORMAL HIGH (ref 0.350–4.500)

## 2023-09-30 MED ORDER — SODIUM CHLORIDE 0.9 % IV BOLUS
1000.0000 mL | Freq: Once | INTRAVENOUS | Status: AC
  Administered 2023-09-30: 1000 mL via INTRAVENOUS

## 2023-09-30 MED ORDER — PANTOPRAZOLE SODIUM 40 MG IV SOLR
40.0000 mg | INTRAVENOUS | Status: DC
  Administered 2023-09-30 – 2023-10-01 (×2): 40 mg via INTRAVENOUS
  Filled 2023-09-30 (×2): qty 10

## 2023-09-30 MED ORDER — SODIUM CHLORIDE 0.9 % IV SOLN
2.0000 g | INTRAVENOUS | Status: AC
  Administered 2023-10-01 – 2023-10-09 (×9): 2 g via INTRAVENOUS
  Filled 2023-09-30 (×9): qty 20

## 2023-09-30 MED ORDER — BUDESONIDE 0.5 MG/2ML IN SUSP
0.5000 mg | Freq: Two times a day (BID) | RESPIRATORY_TRACT | Status: DC
  Administered 2023-09-30 – 2023-10-12 (×25): 0.5 mg via RESPIRATORY_TRACT
  Filled 2023-09-30 (×24): qty 2

## 2023-09-30 MED ORDER — METHYLPREDNISOLONE SODIUM SUCC 40 MG IJ SOLR
40.0000 mg | Freq: Two times a day (BID) | INTRAMUSCULAR | Status: DC
  Administered 2023-09-30 – 2023-10-02 (×6): 40 mg via INTRAVENOUS
  Filled 2023-09-30 (×6): qty 1

## 2023-09-30 MED ORDER — SODIUM CHLORIDE 0.9 % IV SOLN
1.0000 g | Freq: Once | INTRAVENOUS | Status: AC
  Administered 2023-09-30: 1 g via INTRAVENOUS
  Filled 2023-09-30: qty 10

## 2023-09-30 MED ORDER — IPRATROPIUM-ALBUTEROL 0.5-2.5 (3) MG/3ML IN SOLN
3.0000 mL | Freq: Four times a day (QID) | RESPIRATORY_TRACT | Status: DC
  Administered 2023-09-30 – 2023-10-01 (×4): 3 mL via RESPIRATORY_TRACT
  Filled 2023-09-30 (×4): qty 3

## 2023-09-30 NOTE — Evaluation (Signed)
 Occupational Therapy Evaluation Patient Details Name: Jordan Ward MRN: [id number] DOB: 1941-05-24 Today's Date: 09/30/2023   History of Present Illness Jordan Ward, an 83 y/o with multiple co-morbidities including, MS, CKD4, neurogenic bladder with superpubic cystostomy catheter, obstructed left ureter with nephrostomy tube, COPD/Emphysema on home oxygen , hypothyroidism on replacement, dysphagia, non-healing wound right ankle in setting of PAD, HFpEF, cerebral vascular disease. Over the past several days he has been weaker, more short of breath, coughing, left-sided LE weakness, increased slurred speech. He denies having fever, N/V, diarrhea other than his chronic loose stools. He was here to fore able to manage many of his ADLs including bathing, eating, taking his medications. Due to his increased weakness, slurred speech, shortness of breath EMS was activated and he was brought to [facility] for evaluation.   Clinical Impression   Pt admitted for concerns listed above. PTA pt reported that he was fairly independent with all ADL's and functional mobility. At this time, he presents with increased weakness, poor balance, and increased work of breath with exertion.  He requires min to mod assist with functional mobility and ADL's due to fatigue and balance deficits, as well as being heavily reliant on his RW. OT recommending continued skilled OT to maximize strength and endurance to improve independence. Acute OT will continue to follow.       If plan is discharge home, recommend the following: A lot of help with walking and/or transfers;A lot of help with bathing/dressing/bathroom;Assistance with cooking/housework;Help with stairs or ramp for entrance;Assist for transportation    Functional Status Assessment  Patient has had a recent decline in their functional status and demonstrates the ability to make significant improvements in function in a reasonable and predictable amount of time.  Equipment  Recommendations  BSC/3in1    Recommendations for Other Services       Precautions / Restrictions Precautions Precautions: Fall Restrictions Weight Bearing Restrictions Per Provider Order: No      Mobility Bed Mobility Overal bed mobility: Needs Assistance Bed Mobility: Supine to Sit, Sit to Supine     Supine to sit: Mod assist, Min assist Sit to supine: Mod assist, Min assist   General bed mobility comments: needs assist for legs and to bring trunk fully upright for supine to sit; needs assist wtih legs for sit to supine    Transfers Overall transfer level: Needs assistance Equipment used: Rolling walker (2 wheels) Transfers: Sit to/from Stand Sit to Stand: Min assist, Mod assist           General transfer comment: took extra time; fatigues quickly with exertion; stands with trunk and head flexed      Balance Overall balance assessment: Needs assistance Sitting-balance support: Feet supported, Bilateral upper extremity supported Sitting balance-Leahy Scale: Fair Sitting balance - Comments: sits with trunk flexed; fatigues quickly with exertion   Standing balance support: Bilateral upper extremity supported, During functional activity, Reliant on assistive device for balance Standing balance-Leahy Scale: Fair Standing balance comment: fair standing balance with RW and CGA/min A for safety; fatigues quickly; stands with trunk flexed.                           ADL either performed or assessed with clinical judgement   ADL Overall ADL's : Needs assistance/impaired Eating/Feeding: Set up;Sitting   Grooming: Set up;Sitting   Upper Body Bathing: Minimal assistance;Sitting   Lower Body Bathing: Moderate assistance;Sitting/lateral leans;Sit to/from stand   Upper Body Dressing : Minimal assistance;Sitting  Lower Body Dressing: Moderate assistance;Sitting/lateral leans;Sit to/from stand   Toilet Transfer: Moderate assistance;Stand-pivot   Toileting-  Clothing Manipulation and Hygiene: Minimal assistance;Moderate assistance;Sitting/lateral lean;Sit to/from stand       Functional mobility during ADLs: Moderate assistance;Rolling walker (2 wheels) General ADL Comments: min to mod assist depending on whether he needs to stand and balance or not. Fatigues quickly.     Vision Baseline Vision/History: 1 Wears glasses Ability to See in Adequate Light: 1 Impaired Patient Visual Report: No change from baseline Vision Assessment?: No apparent visual deficits     Perception         Praxis         Pertinent Vitals/Pain Pain Assessment Pain Assessment: 0-10 Pain Score: 10-Worst pain ever Pain Location: LLE Pain Descriptors / Indicators: Headache, Discomfort, Grimacing, Heaviness Pain Intervention(s): Limited activity within patient's tolerance, Monitored during session, Repositioned     Extremity/Trunk Assessment Upper Extremity Assessment Upper Extremity Assessment: Left hand dominant;Generalized weakness   Lower Extremity Assessment Lower Extremity Assessment: Defer to PT evaluation LLE Deficits / Details: wound left ankle   Cervical / Trunk Assessment Cervical / Trunk Assessment: Kyphotic   Communication Communication Communication: No apparent difficulties Cueing Techniques: Other (comments) (delayed response time)   Cognition Arousal: Alert Behavior During Therapy: WFL for tasks assessed/performed Overall Cognitive Status: No family/caregiver present to determine baseline cognitive functioning                                       General Comments  VSS on 2L, pt dropped into the 80's several times with sitting up and standing, with noted 72% with initial stand, however hand with sat monitor was gripping RW tightly with poor pleth reading.    Exercises     Shoulder Instructions      Home Living Family/patient expects to be discharged to:: Private residence Living Arrangements: Spouse/significant  other;Children Available Help at Discharge: Family;Available PRN/intermittently Type of Home: House Home Access: Stairs to enter [employer] of Steps: 3 steps Entrance Stairs-Rails: Right Home Layout: One level     Bathroom Shower/Tub: Sponge bathes at baseline   Bathroom Toilet: Standard Bathroom Accessibility: Yes How Accessible: Accessible via walker Home Equipment: Rolling Walker (2 wheels);Cane - single point;Wheelchair - manual;Grab bars - tub/shower;Other (comment) (O2 1L at hone)          Prior Functioning/Environment Prior Level of Function : Needs assist             Mobility Comments: Uses a RW ADLs Comments: reports indep with ADL's, needs assist with IADL's.        OT Problem List: Decreased strength;Decreased activity tolerance;Decreased range of motion;Impaired balance (sitting and/or standing);Decreased safety awareness;Cardiopulmonary status limiting activity;Impaired UE functional use      OT Treatment/Interventions: Self-care/ADL training;Therapeutic exercise;Energy conservation;DME and/or AE instruction;Therapeutic activities;Patient/family education;Balance training    OT Goals(Current goals can be found in the care plan section) Acute Rehab OT Goals Patient Stated Goal: To feel better OT Goal Formulation: With patient Time For Goal Achievement: 10/14/23 Potential to Achieve Goals: Good ADL Goals Pt Will Perform Grooming: with modified independence;standing Pt Will Perform Lower Body Bathing: with contact guard assist;sitting/lateral leans;sit to/from stand Pt Will Perform Lower Body Dressing: with contact guard assist;sitting/lateral leans;sit to/from stand Pt Will Transfer to Toilet: with contact guard assist;stand pivot transfer Pt Will Perform Toileting - Clothing Manipulation and hygiene: sitting/lateral leans;sit to/from stand;with contact guard  assist  OT Frequency: Min 2X/week    Co-evaluation   Reason for Co-Treatment: To  address functional/ADL transfers PT goals addressed during session: Mobility/safety with mobility;Balance        AM-PAC OT 6 Clicks Daily Activity     Outcome Measure Help from another person eating meals?: A Little Help from another person taking care of personal grooming?: A Little Help from another person toileting, which includes using toliet, bedpan, or urinal?: A Lot Help from another person bathing (including washing, rinsing, drying)?: A Lot Help from another person to put on and taking off regular upper body clothing?: A Little Help from another person to put on and taking off regular lower body clothing?: A Lot 6 Click Score: 15   End of Session Equipment Utilized During Treatment: Rolling walker (2 wheels);Oxygen  Nurse Communication: Mobility status  Activity Tolerance: Patient tolerated treatment well Patient left: in bed;with call bell/phone within reach  OT Visit Diagnosis: Unsteadiness on feet (R26.81);Other abnormalities of gait and mobility (R26.89);Muscle weakness (generalized) (M62.81)                Time: 9154-9092 OT Time Calculation (min): 22 min Charges:  OT General Charges $OT Visit: 1 Visit OT Evaluation $OT Eval Moderate Complexity: 1 Mod  Rangel Echeverri, OTR/L [facility]  Kaytlynn Kochan Elane Thelbert 09/30/2023, 9:41 AM

## 2023-09-30 NOTE — Progress Notes (Signed)
 Echocardiogram 2D Echocardiogram has been performed.  Jordan Ward 09/30/2023, 2:00 PM

## 2023-09-30 NOTE — Progress Notes (Signed)
 Four positive blood culture bottles. All bottles positive for strep pnemo.  Notified J. Segars

## 2023-09-30 NOTE — ED Notes (Addendum)
 Imaging was unable to complete CT ANGIO ordered at 2318 because of Creatinine levels. Dr. Lucas Mallow was contacted by imaging to get further information he was unavailable.

## 2023-09-30 NOTE — Evaluation (Signed)
 Physical Therapy Evaluation Patient Details Name: Jordan Ward MRN: [id number] DOB: [birth year] Today's Date: 09/30/2023  History of Present Illness  [name], an 83 y/o with multiple co-morbidities including, MS, CKD4, neurogenic bladder with superpubic cystostomy catheter, obstructed left ureter with nephrostomy tube, COPD/Emphysema on home oxygen , hypothyroidism on replacement, dysphagia, non-healing wound right ankle in setting of PAD, HFpEF, cerebral vascular disease. Over the past several days he has been weaker, more short of breath, coughing, left-sided LE weakness, increased slurred speech. He denies having fever, N/V, diarrhea other than his chronic loose stools. He was here to fore able to manage many of his ADLs including bathing, eating, taking his medications. Due to his increased weakness, slurred speech, shortness of breath EMS was activated and he was brought to [facility] for evaluation.    Clinical Impression  On PT and OT arrival; patient lying in bed; on 2.5 L of O2 with oxygen  saturation at 96% at rest.  Patient agreeable to assessment.  He reports 10/10 pain left leg; it feels heavy.  Patient needs min to mod A for supine to sit; taking extra time and effort to perform due to weakness.  His O2 saturation dips to 84% with sitting on the edge of the bed after transfer but returns back to 90 and above with a seated rest and deep breaths.  Patient needs min A for safety and balance with sitting on edge of bed and sits with a flexed posture.  Sit to stand with min to mod assist to RW. Patient O2 saturation dips to 72% but he is able to take a couple of sidesteps along side the bed before sitting down; his O2 saturation then returns to 80% and above after sitting.  Needs min to mod A to return to supine.  His O2 sat then returns to 90-91% at the end of assessment.  Patient fatigues quickly and has a lingering cough.  Also has a bit of delay when responding to questions although he answers  appropriately.  patient left in bed with call button in reach and nursing notified of mobility status.  Patient will benefit from continued skilled therapy services during the remainder of his hospital stay and at the next recommended venue of care to address deficits and promote return to optimal function.             If plan is discharge home, recommend the following: A little help with walking and/or transfers;A little help with bathing/dressing/bathroom;Help with stairs or ramp for entrance   Can travel by private vehicle   No    Equipment Recommendations None recommended by PT  Recommendations for Other Services       Functional Status Assessment Patient has had a recent decline in their functional status and demonstrates the ability to make significant improvements in function in a reasonable and predictable amount of time.     Precautions / Restrictions Precautions Precautions: Fall Restrictions Weight Bearing Restrictions Per Provider Order: No      Mobility  Bed Mobility Overal bed mobility: Needs Assistance Bed Mobility: Supine to Sit, Sit to Supine     Supine to sit: Mod assist, Min assist Sit to supine: Mod assist, Min assist   General bed mobility comments: needs assist for legs and to bring trunk fully upright for supine to sit; needs assist wtih legs for sit to supine    Transfers Overall transfer level: Needs assistance Equipment used: Rolling walker (2 wheels) Transfers: Sit to/from Stand Sit to Stand: Min  assist, Mod assist           General transfer comment: took extra time; fatigues quickly with exertion; stands with trunk and head flexed    Ambulation/Gait Ambulation/Gait assistance: Mod assist Gait Distance (Feet): 1 Feet (sidesteps at bedside with RW) Assistive device: Rolling walker (2 wheels) Gait Pattern/deviations: Trunk flexed, Knee flexed in stance - right, Knee flexed in stance - left Gait velocity: decreased        Stairs             Wheelchair Mobility     Tilt Bed    Modified Rankin (Stroke Patients Only)       Balance Overall balance assessment: Needs assistance Sitting-balance support: Feet supported, Bilateral upper extremity supported Sitting balance-Leahy Scale: Fair Sitting balance - Comments: sits with trunk flexed; fatigues quickly with exertion   Standing balance support: Bilateral upper extremity supported, During functional activity, Reliant on assistive device for balance Standing balance-Leahy Scale: Fair Standing balance comment: fair standing balance with RW and CGA/min A for safety; fatigues quickly; stands with trunk flexed.                             Pertinent Vitals/Pain Pain Assessment Pain Assessment: 0-10 Pain Score: 10-Worst pain ever Pain Location: LLE Pain Descriptors / Indicators: Headache, Discomfort, Grimacing Pain Intervention(s): Limited activity within patient's tolerance, Monitored during session    Home Living Family/patient expects to be discharged to:: Private residence Living Arrangements: Spouse/significant other;Children Available Help at Discharge: Family;Available PRN/intermittently Type of Home: House Home Access: Stairs to enter Entrance Stairs-Rails: Right Entrance Stairs-Number of Steps: 3 steps   Home Layout: One level Home Equipment: [employer] (2 wheels);Cane - single point;Wheelchair - manual;Grab bars - tub/shower;Other (comment) (O2 1L at hone)      Prior Function Prior Level of Function : Needs assist             Mobility Comments: Uses a RW ADLs Comments: reports indep with ADL's, needs assist with IADL's.     Extremity/Trunk Assessment   Upper Extremity Assessment Upper Extremity Assessment: Defer to OT evaluation    Lower Extremity Assessment Lower Extremity Assessment: Generalized weakness;LLE deficits/detail (states left leg feels heavy) LLE Deficits / Details: wound left ankle    Cervical /  Trunk Assessment Cervical / Trunk Assessment: Kyphotic  Communication   Communication Communication: No apparent difficulties Cueing Techniques: Other (comments) (slight delay response)  Cognition Arousal: Alert Behavior During Therapy: WFL for tasks assessed/performed Overall Cognitive Status: No family/caregiver present to determine baseline cognitive functioning                                          General Comments General comments (skin integrity, edema, etc.): states he has a wound on left ankle    Exercises     Assessment/Plan    PT Assessment Patient needs continued PT services  PT Problem List Decreased strength;Decreased activity tolerance;Decreased balance;Decreased mobility;Cardiopulmonary status limiting activity;Pain       PT Treatment Interventions Functional mobility training;Therapeutic activities;Therapeutic exercise;Balance training;Neuromuscular re-education;Patient/family education    PT Goals (Current goals can be found in the Care Plan section)  Acute Rehab PT Goals Patient Stated Goal: return home after rehab PT Goal Formulation: With patient Time For Goal Achievement: 10/14/23 Potential to Achieve Goals: Good    Frequency Min 3X/week  Co-evaluation PT/OT/SLP Co-Evaluation/Treatment: Yes Reason for Co-Treatment: To address functional/ADL transfers PT goals addressed during session: Mobility/safety with mobility;Balance         AM-PAC PT 6 Clicks Mobility  Outcome Measure Help needed turning from your back to your side while in a flat bed without using bedrails?: A Little Help needed moving from lying on your back to sitting on the side of a flat bed without using bedrails?: A Little Help needed moving to and from a bed to a chair (including a wheelchair)?: A Lot Help needed standing up from a chair using your arms (e.g., wheelchair or bedside chair)?: A Lot Help needed to walk in hospital room?: A Lot Help needed  climbing 3-5 steps with a railing? : Total 6 Click Score: 13    End of Session Equipment Utilized During Treatment: Oxygen  Activity Tolerance: Patient limited by fatigue Patient left: in bed;with call bell/phone within reach Nurse Communication: Mobility status PT Visit Diagnosis: Muscle weakness (generalized) (M62.81);History of falling (Z91.81);Other abnormalities of gait and mobility (R26.89)    Time: 9160-9099 PT Time Calculation (min) (ACUTE ONLY): 21 min   Charges:   PT Evaluation $PT Eval Moderate Complexity: 1 Mod   PT General Charges $$ ACUTE PT VISIT: 1 Visit         9:36 AM, 09/30/23 Maleeah Crossman Small Everlie Eble MPT [facility] [place] [phone] Ph:[phone]

## 2023-09-30 NOTE — [place] FL2 (Signed)
 [place]  MEDICAID FL2 LEVEL OF CARE FORM     IDENTIFICATION  Patient Name: Jordan Ward Birthdate: [birth year] Sex: male Admission Date (Current Location): 09/29/2023  [facility] and [place] Number:  [employer] and Address:  [facility],  [address] [place] [address]      Provider Number: [id number]  Attending Physician Name and Address:  Ricky Fines, MD  Relative Name and Phone Number:       Current Level of Care: Hospital Recommended Level of Care: Skilled Nursing Facility Prior Approval Number:    Date Approved/Denied:   PASRR Number: [id number]  Discharge Plan: SNF    Current Diagnoses: Patient Active Problem List   Diagnosis Date Noted   COVID-19 09/30/2023   CKD (chronic kidney disease) stage 4, GFR 15-29 ml/min (HCC) 09/29/2023   Nephrostomy present (HCC) 09/29/2023   Emphysema lung (HCC) 09/29/2023   Right renal atrophy 07/30/2023   Hypokalemia 05/19/2022   Pressure ulcer, heel 07/14/2021   Rigors 03/07/2021   Encounter for removal of vascular catheter 09/14/2020   Pneumonia due to COVID-19 virus 07/12/2020   Bacteremia due to Gram-negative bacteria 05/04/2020   Hydronephrosis 05/02/2020   Dehydration 03/02/2020   Nausea and vomiting 03/02/2020   Ventral hernia without obstruction or gangrene 03/01/2020   Hypotension 03/01/2020   Poor appetite 03/01/2020   Detrusor areflexia 09/16/2019   Cerebrovascular disease 08/17/2018   Partial small bowel obstruction (HCC) 07/11/2017   Choledocholithiasis 07/11/2017   Paroxysmal atrial tachycardia (HCC) 06/14/2017   Hypophosphatemia 06/14/2017   Pressure injury of skin 05/26/2017   Hydronephrosis due to obstruction of ureter 05/26/2017   Obstructive uropathy 05/25/2017   CKD (chronic kidney disease), stage III (HCC) 08/23/2016   Hydronephrosis of left kidney 08/23/2016   Kidney stones 09/13/2015   Essential hypertension    Pressure ulcer 08/04/2015   Chronic diastolic  (congestive) heart failure (HCC) 08/04/2015   Hydronephrosis with obstructing calculus 08/04/2015   Occult blood positive stool 09/05/2014   Edema 08/19/2014   Dilated cardiomyopathy (HCC) 07/21/2014   Headache 03/13/2014   Cerebral infarction (HCC) 03/13/2014   Abnormality of gait 03/13/2014   Tremors of nervous system 10/08/2011   Hypothyroidism 10/08/2011   Pulmonary nodule 10/08/2011   Dysphagia 10/07/2011   HTN (hypertension), malignant 10/06/2011   Chronic suprapubic catheter (HCC) 10/06/2011   Pulmonary fibrosis (HCC) 10/06/2011   UTI (urinary tract infection) 10/03/2011   Junctional rhythm 10/02/2011   Multiple sclerosis (HCC) 10/02/2011   Chronic diarrhea 06/17/2011   Hx of adenomatous colonic polyps 06/17/2011   High grade dysplasia in colonic adenoma 09/29/2005    Orientation RESPIRATION BLADDER Height & Weight     Self, Time, Situation, Place  O2 (1L) Indwelling catheter Weight: 111 lb 15.9 oz (50.8 kg) Height:  5' 5 (165.1 cm)  BEHAVIORAL SYMPTOMS/MOOD NEUROLOGICAL BOWEL NUTRITION STATUS      Incontinent Diet (See d/c summary.)  AMBULATORY STATUS COMMUNICATION OF NEEDS Skin   Extensive Assist Verbally Other (Comment) (2cm wound, round with clean base right lateral ankle at malleolus)                       Personal Care Assistance Level of Assistance  Bathing, Feeding, Dressing Bathing Assistance: Maximum assistance Feeding assistance: Limited assistance Dressing Assistance: Maximum assistance     Functional Limitations Info  Sight, Hearing, Speech Sight Info: Adequate Hearing Info: Adequate Speech Info: Adequate    SPECIAL CARE FACTORS FREQUENCY  PT (By licensed PT)  PT Frequency: 5x weekly              Contractures      Additional Factors Info  Code Status, Allergies, Psychotropic, Isolation Precautions Code Status Info: Full code Allergies Info: Tetracyclines & Related, Cipro  (ciprofloxacin  Hcl) Psychotropic Info: Valium     Isolation Precautions Info: Airborne/contact precautions  COVID + 09/29/23     Current Medications (09/30/2023):  This is the current hospital active medication list Current Facility-Administered Medications  Medication Dose Route Frequency Provider Last Rate Last Admin   0.9 %  sodium chloride  infusion   Intravenous Continuous Norins, Michael E, MD 75 mL/hr at 09/30/23 0824 Infusion Verify at 09/30/23 9175   acetaminophen  (TYLENOL ) tablet 650 mg  650 mg Oral Q4H PRN Norins, Michael E, MD       Or   acetaminophen  (TYLENOL ) 160 MG/5ML solution 650 mg  650 mg Per Tube Q4H PRN Norins, Michael E, MD       Or   acetaminophen  (TYLENOL ) suppository 650 mg  650 mg Rectal Q4H PRN Norins, Michael E, MD       amLODipine  (NORVASC ) tablet 5 mg  5 mg Oral QPM Norins, Michael E, MD       aspirin  tablet 325 mg  325 mg Oral Daily Norins, Michael E, MD       budesonide  (PULMICORT ) nebulizer solution 0.5 mg  0.5 mg Nebulization BID Ricky Fines, MD       cefTRIAXone  (ROCEPHIN ) 1 g in sodium chloride  0.9 % 100 mL IVPB  1 g Intravenous Q24H Norins, Michael E, MD       clopidogrel  (PLAVIX ) tablet 75 mg  75 mg Oral Daily Norins, Michael E, MD       diazepam  (VALIUM ) tablet 5 mg  5 mg Oral BID Norins, Michael E, MD   5 mg at 09/29/23 2318   heparin  injection 5,000 Units  5,000 Units Subcutaneous Q8H Norins, Michael E, MD   5,000 Units at 09/30/23 9388   HYDROcodone -acetaminophen  (NORCO) 7.5-325 MG per tablet 1 tablet  1 tablet Oral BID Norins, Michael E, MD   1 tablet at 09/30/23 0036   ipratropium-albuterol  (DUONEB) 0.5-2.5 (3) MG/3ML nebulizer solution 3 mL  3 mL Nebulization QID Ricky Fines, MD       levothyroxine  (SYNTHROID ) tablet 75 mcg  75 mcg Oral q AM Norins, Michael E, MD   75 mcg at 09/30/23 9386   loperamide  (IMODIUM ) capsule 2 mg  2 mg Oral QID PRN Norins, Michael E, MD       methylPREDNISolone  sodium succinate (SOLU-MEDROL ) 40 mg/mL injection 40 mg  40 mg Intravenous BID Ricky Fines, MD   40 mg  at 09/30/23 1035   pantoprazole  (PROTONIX ) injection 40 mg  40 mg Intravenous Q24H Ricky Fines, MD   40 mg at 09/30/23 1035   polyvinyl alcohol  (LIQUIFILM TEARS) 1.4 % ophthalmic solution 1 drop  1 drop Both Eyes QID PRN Ula Prentice SAUNDERS, MD       potassium chloride  SA (KLOR-CON  M) CR tablet 20 mEq  20 mEq Oral QPM Norins, Michael E, MD   20 mEq at 09/29/23 2318   remdesivir  100 mg in sodium chloride  0.9 % 100 mL IVPB  100 mg Intravenous Daily Ricky Fines, MD       senna-docusate (Senokot-S) tablet 1 tablet  1 tablet Oral QHS PRN Norins, Michael E, MD       thiamine  (VITAMIN B1) tablet 100 mg  100 mg Oral Daily Norins, Michael E, MD  tiZANidine  (ZANAFLEX ) tablet 4 mg  4 mg Oral Q6H PRN Norins, Michael E, MD         Discharge Medications: Please see discharge summary for a list of discharge medications.  Relevant Imaging Results:  Relevant Lab Results:   Additional Information SSN: [id number]. Has nephrostomy tube.  [place] Saddie Kim, LCSW

## 2023-09-30 NOTE — Evaluation (Signed)
 Clinical/Bedside Swallow Evaluation Patient Details  Name: Jordan Ward MRN: [id number] Date of Birth: [birth year]  Today's Date: 09/30/2023 Time: SLP Start Time (ACUTE ONLY): 1321 SLP Stop Time (ACUTE ONLY): 1346 SLP Time Calculation (min) (ACUTE ONLY): 25 min  Past Medical History:  Past Medical History:  Diagnosis Date   Anemia    Arthritis    Back pain, chronic    Bilateral carotid bruits    C. difficile colitis 09/2011   CAD (coronary artery disease)    Carotid artery stenosis    Cerebrovascular disease    Cerebrovascular disease 08/17/2018   Colon polyps    Complication of cystostomy catheter, initial encounter (HCC) 04/20/2020   Dyslipidemia    Dysphagia 10/07/2011   Encephalopathy    Gait disorder    HA (headache)    High grade dysplasia in colonic adenoma 09/2005   History of kidney stones    HTN (hypertension), malignant 10/06/2011   Hypernatremia    Hypokalemia    Hypothyroidism 10/08/2011   Insomnia    Junctional rhythm    Kidney stones    MS (multiple sclerosis) (HCC)    Nausea and vomiting 03/02/2020   Neuromuscular disorder (HCC)    MS   OSA (obstructive sleep apnea)    Paroxysmal atrial tachycardia (HCC)    Peripheral vascular disease (HCC)    Pneumonia 4 yrs ago   Pulmonary fibrosis (HCC) 10/06/2011   Pulmonary nodule 10/08/2011   PVD (peripheral vascular disease) (HCC)    Sacral ulcer (HCC)    Sleep apnea    cannot tolerate   Stroke (HCC)    left sided weakness   Suprapubic catheter (HCC)    TIA (transient ischemic attack)    Tremors of nervous system 10/08/2011   Urinary tract infection    Ventral hernia without obstruction or gangrene     Large 8X9cm ventral hernia with loss of domain. CT reads report as diastasis recti with herniation or diastasis recti.  Dr. Kallie, Surgery, reviewed CT with radiology and there is herniation with only hernia sac or peritoneum over the bowel and large separation of the rectus muslce (i.e. diastasis recti aka  loss of domain).  No surgical intervention recommended given size, age, and health.    Past Surgical History:  Past Surgical History:  Procedure Laterality Date   APPENDECTOMY  09/2005   at time of left hemicolectomy   BACK SURGERY  1976/1979   lower   BILIARY DILATION N/A 03/03/2020   Procedure: BILIARY DILATION;  Surgeon: Golda Claudis PENNER, MD;  [facility]: [facility];  Service: Endoscopy;  Laterality: N/A;   CATARACT EXTRACTION W/PHACO Right 03/08/2018   Procedure: CATARACT EXTRACTION PHACO AND INTRAOCULAR LENS PLACEMENT RIGHT EYE;  Surgeon: Perley Hamilton, MD;  Location: [facility];  Service: Ophthalmology;  Laterality: Right;  CDE: 8.86   CATARACT EXTRACTION W/PHACO Left 04/05/2018   Procedure: CATARACT EXTRACTION PHACO AND INTRAOCULAR LENS PLACEMENT (IOC);  Surgeon: Perley Hamilton, MD;  Location: [facility];  Service: Ophthalmology;  Laterality: Left;  CDE: 7.36   CENTRAL LINE INSERTION-TUNNELED Right 09/11/2020   Procedure: PLACEMENT OF TUNNELED CENTRAL LINE INTO JUGULAR VEIN;  Surgeon: Kallie Manuelita BROCKS, MD;  Location: [facility];  Service: General;  Laterality: Right;   CHOLECYSTECTOMY     Dr. Claudene   COLON SURGERY  09/2005   Fleishman: four tubular adenomas, large adenomatous polyp with HIGH GRADE dysplasia   COLONOSCOPY  11/2004   Dr. Lendon sessile polyp splenic flexure, 10mm sessile polyp desc colon, tubulovillous adenoma (  bx not removed)   COLONOSCOPY  01/2005   poor prep, polyp could not be found   COLONOSCOPY  05/2005   with EMR, polypectomy Dr. Norleen Dec, bx showed high grade dysplasia, partially resected   COLONOSCOPY  09/2005   Dr. Dec, [place] ink tattooing, four villous colon polyp (3 had been missed on previous colonoscopies due to limitations of procedures   COLONOSCOPY  09/2006   normal TI, no polyps   COLONOSCOPY  10/2007   Dr. Randol distal mammillations, benign bx, normal TI, random bx neg for microscopic colitis   CYSTOSCOPY W/ URETERAL STENT PLACEMENT Left  09/05/2020   Procedure: CYSTOSCOPY WITH RETROGRADE PYELOGRAM/URETERAL STENT PLACEMENT;  Surgeon: Cam Morene ORN, MD;  Location: [facility];  Service: Urology;  Laterality: Left;   CYSTOSCOPY WITH LITHOLAPAXY N/A 07/27/2018   Procedure: CYSTOSCOPY WITH LITHOLAPAXY VIA  SUPRAPUBIC TUBE;  Surgeon: Matilda Senior, MD;  Location: [facility];  Service: Urology;  Laterality: N/A;   CYSTOSCOPY WITH RETROGRADE PYELOGRAM, URETEROSCOPY AND STENT PLACEMENT Left 06/09/2017   Procedure: CYSTOSCOPY WITH LEFT RETROGRADE PYELOGRAM, LEFT URETEROSCOPY, LEFT URETEROSCOPIC STONE EXTRACTION, LEFT URETERAL STENT PLACEMENT;  Surgeon: Matilda Senior, MD;  Location: [facility];  Service: Urology;  Laterality: Left;   CYSTOSCOPY/URETEROSCOPY/HOLMIUM LASER/STENT PLACEMENT Left 05/22/2020   Procedure: CYSTOSCOPY/URETEROSCOPY/STENT PLACEMENT;  Surgeon: Matilda Senior, MD;  Location: [facility];  Service: Urology;  Laterality: Left;   ERCP N/A 03/03/2020   Procedure: ENDOSCOPIC RETROGRADE CHOLANGIOPANCREATOGRAPHY (ERCP);  Surgeon: Golda Claudis PENNER, MD;  [facility]: [facility];  Service: Endoscopy;  Laterality: N/A;   ERCP N/A 04/20/2020   Procedure: ENDOSCOPIC RETROGRADE CHOLANGIOPANCREATOGRAPHY (ERCP);  Surgeon: Golda Claudis PENNER, MD;  [facility]: [facility];  Service: Endoscopy;  Laterality: N/A;  to be done at 7:30am in OR   GASTROINTESTINAL STENT REMOVAL N/A 04/20/2020   Procedure: STENT REMOVAL;  Surgeon: Golda Claudis PENNER, MD;  [facility]: [facility];  Service: Endoscopy;  Laterality: N/A;   INGUINAL HERNIA REPAIR  1971   bilateral   INSERTION OF SUPRAPUBIC CATHETER  06/09/2017   Procedure: EXCHANGE OF SUPRAPUBIC CATHETER;  Surgeon: Matilda Senior, MD;  Location: [facility];  Service: Urology;;   INSERTION OF SUPRAPUBIC CATHETER  05/22/2020   Procedure: SUPRAPUBIC CATHETER EXCHANGE;  Surgeon: Matilda Senior, MD;  Location: [facility];  Service: Urology;;   IR NEPHROSTOMY EXCHANGE LEFT  03/25/2021   IR NEPHROSTOMY EXCHANGE LEFT   05/20/2021   IR NEPHROSTOMY EXCHANGE LEFT  07/13/2021   IR NEPHROSTOMY EXCHANGE LEFT  09/09/2021   IR NEPHROSTOMY EXCHANGE LEFT  11/04/2021   IR NEPHROSTOMY EXCHANGE LEFT  12/23/2021   IR NEPHROSTOMY EXCHANGE LEFT  02/17/2022   IR NEPHROSTOMY EXCHANGE LEFT  04/14/2022   IR NEPHROSTOMY EXCHANGE LEFT  06/09/2022   IR NEPHROSTOMY EXCHANGE LEFT  07/28/2022   IR NEPHROSTOMY EXCHANGE LEFT  09/09/2022   IR NEPHROSTOMY EXCHANGE LEFT  11/04/2022   IR NEPHROSTOMY EXCHANGE LEFT  12/30/2022   IR NEPHROSTOMY EXCHANGE LEFT  02/25/2023   IR NEPHROSTOMY EXCHANGE LEFT  03/31/2023   IR NEPHROSTOMY EXCHANGE LEFT  05/26/2023   IR NEPHROSTOMY EXCHANGE LEFT  07/21/2023   IR NEPHROSTOMY EXCHANGE LEFT  09/15/2023   IR NEPHROSTOMY PLACEMENT LEFT  05/26/2017   IR NEPHROSTOMY PLACEMENT LEFT  05/02/2020   IR NEPHROSTOMY PLACEMENT LEFT  02/15/2021   IR URETERAL STENT PERC REMOVAL MOD SED  03/25/2021   KIDNEY STONE SURGERY  09/13/2015   LITHOTRIPSY N/A 03/03/2020   Procedure: MECHANICAL LITHOTRIPSY WITH REMOVAL OF MULTIPLE STONE FRAGMENTS;  Surgeon: Golda,  Claudis PENNER, MD;  [facility];  Service: Endoscopy;  Laterality: N/A;   NEPHROLITHOTOMY Left 09/13/2015   Procedure: LEFT PERCUTANEOUS NEPHROLITHOTOMY ;  Surgeon: Garnette Shack, MD;  Location: [facility];  Service: Urology;  Laterality: Left;   NEPHROSTOMY TUBE REMOVAL  05/22/2020   Procedure: NEPHROSTOMY TUBE REMOVAL;  Surgeon: Shack Garnette, MD;  Location: [facility];  Service: Urology;;   [facility] REMOVAL Right 09/20/2020   Procedure: MINOR REMOVAL CENTRAL LINE;  Surgeon: Kallie Manuelita BROCKS, MD;  Location: [facility];  Service: General;  Laterality: Right;  Pt to arrive at 7:30am for procedure   REMOVAL OF STONES N/A 04/20/2020   Procedure: REMOVAL OF STONES;  Surgeon: Golda Claudis PENNER, MD;  [facility];  Service: Endoscopy;  Laterality: N/A;   SPHINCTEROTOMY  03/03/2020   Procedure: BILLARY SPHINCTEROTOMY;  Surgeon: Golda Claudis PENNER, MD;  [facility];  Service: Endoscopy;;   SUPRAPUBIC CATHETER INSERTION     HPI:  Mr. Caradine, an 83 y/o with multiple co-morbidities including, MS, CKD4, neurogenic bladder with superpubic cystostomy catheter, obstructed left ureter with nephrostomy tube, COPD/Emphysema on home oxygen , hypothyroidism on replacement, dysphagia, non-healing wound right ankle in setting of PAD, HFpEF, cerebral vascular disease. Over the past several days he has been weaker, more short of breath, coughing, left-sided LE weakness, increased slurred speech. He denies having fever, N/V, diarrhea other than his chronic loose stools. He was here to fore able to manage many of his ADLs including bathing, eating, taking his medications. Due to his increased weakness, slurred speech, shortness of breath EMS was activated and he was brought to [facility] for evaluation. BSE requested    Assessment / Plan / Recommendation  Clinical Impression  Patient known to ST team from long standing history of dysphagia. Pt has been on thickened liquids on and off for many years and known history of moderate/large Zenker's diverticulum with retrograde movement into the pharynx after the swallow. Most recent MBSS was completed in 2023 revealing aspiration of thin liquids, aspiration of residue and aspiration of material refluxed from Zenker's diverticulum; MBSS recommended thin liquids OR HTL during meals and free water  and soft solids. Pt was consuming thin liquids at that time and Pt reports is still on thin liquids and has likely been aspirating liquids for years. Pt currently has COVID and PNA and is deconditioned and weak.   Today, Pt consumed thin liquids with some immediate coughing, wet vocal quality and a delayed cough. Also note belching and audible regurgitation after the swallow with a visualized reflexive repeat swallow. Pt consumed regular and puree textures with slow prolonged oral stage, multiple swallows, belching and delayed coughing. Given long  standing history of dysphagia and current deconditioned status (PNA & COVID), Pt is at risk for aspiration. Recommend conservative diet of HONEY THICK liquids and D2/fine chopped diet; crush meds in applesauce. ST will plan to complete MBSS during acute stay if would be of benefit. If Pt and Pt's wife do not wish to adjust Pt's diet MBSS may not be indicated. Attempted to contact Pt's wife via phone without success. ST will continue to follow, thank you for this referral,  SLP Visit Diagnosis: Dysphagia, unspecified (R13.10)    Aspiration Risk  Moderate aspiration risk    Diet Recommendation Dysphagia 2 (Fine chop);Honey-thick liquid    Liquid Administration via: Cup Medication Administration: Crushed with puree Supervision: Patient able to self feed Compensations: Slow rate;Small sips/bites;Follow solids with liquid Postural Changes: Seated upright at 90  degrees    Other  Recommendations Oral Care Recommendations: Oral care QID Caregiver Recommendations: Avoid jello, ice cream, thin soups, popsicles;Remove water  pitcher;Have oral suction available    Recommendations for follow up therapy are one component of a multi-disciplinary discharge planning process, led by the attending physician.  Recommendations may be updated based on patient status, additional functional criteria and insurance authorization.  Follow up Recommendations Home health SLP      Assistance Recommended at Discharge    Functional Status Assessment Patient has had a recent decline in their functional status and demonstrates the ability to make significant improvements in function in a reasonable and predictable amount of time.  Frequency and Duration min 1 x/week  1 week       Prognosis Prognosis for improved oropharyngeal function: Fair Barriers to Reach Goals: Severity of deficits;Time post onset      Swallow Study   General Date of Onset: 09/29/23 HPI: Mr. Takahashi, an 83 y/o with multiple co-morbidities  including, MS, CKD4, neurogenic bladder with superpubic cystostomy catheter, obstructed left ureter with nephrostomy tube, COPD/Emphysema on home oxygen , hypothyroidism on replacement, dysphagia, non-healing wound right ankle in setting of PAD, HFpEF, cerebral vascular disease. Over the past several days he has been weaker, more short of breath, coughing, left-sided LE weakness, increased slurred speech. He denies having fever, N/V, diarrhea other than his chronic loose stools. He was here to fore able to manage many of his ADLs including bathing, eating, taking his medications. Due to his increased weakness, slurred speech, shortness of breath EMS was activated and he was brought to [facility] for evaluation. BSE requested Type of Study: Bedside Swallow Evaluation Previous Swallow Assessment: Long hx of dysphagia. Most recent MBSS in 2023 Diet Prior to this Study: NPO Temperature Spikes Noted: No Respiratory Status: Nasal cannula History of Recent Intubation: No Behavior/Cognition: Alert;Cooperative;Pleasant mood Oral Cavity Assessment: Within Functional Limits Oral Care Completed by SLP: Recent completion by staff Oral Cavity - Dentition: Missing dentition Vision: Functional for self-feeding Self-Feeding Abilities: Able to feed self Patient Positioning: Upright in bed Baseline Vocal Quality: Normal Volitional Cough: Strong Volitional Swallow: Able to elicit    Oral/Motor/Sensory Function Overall Oral Motor/Sensory Function: Within functional limits   Ice Chips Ice chips: Within functional limits   Thin Liquid Thin Liquid: Impaired Presentation: Cup;Spoon Oral Phase Functional Implications: Prolonged oral transit Pharyngeal  Phase Impairments: Multiple swallows;Wet Vocal Quality;Cough - Delayed    Nectar Thick Nectar Thick Liquid: Not tested   Honey Thick Honey Thick Liquid: Not tested   Puree Puree: Impaired Presentation: Spoon Pharyngeal Phase Impairments: Cough - Delayed   Solid      Solid: Impaired Oral Phase Functional Implications: Prolonged oral transit Pharyngeal Phase Impairments: Cough - Delayed     Cote Mayabb H. Clois KILLIAN, CCC-SLP Speech Language Pathologist  [name] 09/30/2023,2:09 PM

## 2023-09-30 NOTE — Progress Notes (Signed)
 Progress Note   Patient: Jordan Ward FMW:[id number] DOB: 1941-03-23 DOA: 09/29/2023     1 DOS: the patient was seen and examined on 09/30/2023   Brief hospital admission course: As per H&P written by Dr. Harlow on 12 31st 24 Mr. Didonato, an 83 y/o with multiple co-morbidities including, MS, CKD4, neurogenic bladder with superpubic cystostomy catheter, obstructed left ureter with nephrostomy tube, COPD/Emphysema on home oxygen , hypothyroidism on replacement, dysphagia, non-healing wound right ankle in setting of PAD, HFpEF, cerebral vascular disease. Over the past several days he has been weaker, more short of breath, coughing, left-sided LE weakness, increased slurred speech. He denies having fever, N/V, diarrhea other than his chronic loose stools. He was here to fore able to manage many of his ADLs including bathing, eating, taking his medications. Due to his increased weakness, slurred speech, shortness of breath EMS was activated and he was brought to [facility] for evaluation.     ED Course: T 98.2  94/66  HR 79  RR 20. A very thin, chronically ill appearing man in no distress. Able to give history and state his care wishes. Lab: glucose 144, BUN/Cr 60/3.97 (baseline 26/2.0), BNP 488, Troponin 46 to 35, C BC nl, U/A cloud, Large LE, many baceria, > 50 WBC/hpf. CXR - multifocal pneumonia R>L, advanced emphysema. MRI - p8untate infarcts left cerebeller, right and left occipital lobes, multiple vascular stenosis and occlusin right [place] group.   In ED patient given Rocephin  1g, decadron . EDP consulted Neurology. TRH asked to admit for medical management covid pneumonia and other co-morbidities.   Assessment and Plan: * Pneumonia due to COVID-19 virus -1 more dose of remdesivir  as already order -Continue treatment with steroids, mucolytic's, flutter valve and bronchodilator management -Continue home oxygen  supplementation -Follow inflammatory markers -Will continue supportive care and follow clinical  response. -Checking procalcitonin; due to underlying UTI also receiving ceftriaxone  which will provide coverage in the case of any superimposed bacterial infection.  Cerebrovascular disease -Complete stroke workup -Follow neurology service recommendation -Prior to admission using aspirin  for secondary prevention -Patient out of the window for any thrombolytic management or intervention. -Will most likely require addition of Plavix  to his regimen. -Follow speech therapy, PT and OT recommendations.  UTI (urinary tract infection) -Associated with chronic suprapubic catheter -Follow urine culture -Continue current IV antibiotics.  Emphysema lung (HCC) -Continue oxygen  supplementation -Continue bronchodilator management -Receiving steroids as part of treatment for COVID -Follow clinical response and continue supportive care.  Nephrostomy present (HCC) -Continue nephrostomy tube care -Continue current antibiotic for UTI -Continue patient follow-up with urology service.  CKD (chronic kidney disease) stage 4, GFR 15-29 ml/min (HCC) -Continue to follow renal function trend -Continue minimizing nephrotoxic agents and avoid the use of contrast and hypotension. -Good urine output currently appreciated.  Chronic diastolic (congestive) heart failure (HCC) -Elevated BNP was present at time of admission -Some decreased breath sounds reported by admitting doctor patient's lung bases. -IV Lasix  x 2 dosages has been ordered -Follow daily weights and urine output -Low-sodium diet discussed with patient -No frank exacerbation currently present.  Given underlying history of will like to keep lungs on the dry side.  Essential hypertension -Stable for the most part -Continue to follow vital signs.  Dilated cardiomyopathy (HCC) -Follow 2D echo -No significant signs of fluid overload on exam.  Hypothyroidism -TSH elevated -Planning to resume the use of Synthroid  at adjusted dose when able to  take p.o.'s.  Dysphagia -Long standing problem which he attributes to MS. Now with evidence  of new CVA -Follow recommendation by speech therapy.  Chronic suprapubic catheter (HCC) -Catheter site appears clean and normal -No erythematous changes or drainage -Continue current IV antibiotics -Continue local care. -Continue patient follow-up with urology service.  Multiple sclerosis (HCC) -No receiving any medications at the moment -Continue follow-up with neurology.   Subjective:  Reporting worsening left-sided weakness, intermittent coughing spells, short winded sensation and general malaise.  Currently afebrile.  Physical Exam: Vitals:   09/30/23 0530 09/30/23 0630 09/30/23 0830 09/30/23 0831  BP: 97/64 96/62 104/66   Pulse: 61 60 64   Resp: 19 20 16    Temp:    (!) 97.1 F (36.2 C)  TempSrc:    Axillary  SpO2: 94% 95% 96%   Weight:      Height:       General exam: Alert, awake, oriented x 3; reporting feeling weak on his left side, intermittent coughing spells and short winded activity with minimal exertion. Respiratory system: Positive rhonchi bilaterally; no using accessory muscles; good saturation on chronic supplementation. Cardiovascular system: Rate controlled, no rubs, no gallops, no JVD. Gastrointestinal system: Abdomen is nondistended, soft and with positive bowel sounds.  Chronic suprapubic catheter and nephrostomy tube in place. Central nervous system: Left-sided weakness appreciated; slurred speech or abnormal eye movements. Extremities: No cyanosis or clubbing. Skin: No petechiae. Psychiatry: Flat affect appreciated.  Data Reviewed: A1c: Pending 2D echo: Pending Lipid panel: Total cholesterol 42, HDL 14, LDL 16 TSH: 6.285 Umnenwpw:53>> 35 Urinalysis: Demonstrating cloudy appearance with large leukocyte esterase and many bacteria.   Family Communication: No family at bedside.  Disposition: Status is: Inpatient Remains inpatient appropriate because:  IV and completion stroke work up    Planned Discharge Destination: Skilled nursing facility  Time spent: 50 minutes  Author: Eric Nunnery, MD 09/30/2023 9:44 AM  For on call review [email or web address].

## 2023-09-30 NOTE — TOC Initial Note (Signed)
 Transition of Care [facility]) - Initial/Assessment Note    Patient Details  Name: Jordan Ward MRN: [id number] Date of Birth: Dec 17, 1940  [facility] CM/SW Contact:    Mcarthur Saddie Kim, LCSW Phone Number: 09/30/2023, 11:45 AM  Clinical Narrative:  Pt admitted with pneumonia due to COVID-19. Pt lives with his wife and daughter. He is typically fairly independent with ADLs but has been very weak the past few days due to illness. Pt requires 1L home O2 [facility]) at night. He is active with [facility] for wound care. Artavia with [facility] notified of admission. PT evaluated pt and recommend SNF. Discussed placement process with wife, including Medicare.gov ratings. She requests [facility] or [facility] SNF. Referral sent. TOC will follow.                  Expected Discharge Plan: Skilled Nursing Facility Barriers to Discharge: Continued Medical Work up   Patient Goals and CMS Choice Patient states their goals for this hospitalization and ongoing recovery are:: short term SNF   Choice offered to / list presented to : Spouse [place] ownership interest in [facility].provided to:: Spouse    Expected Discharge Plan and Services In-house Referral: Clinical Social Work   Post Acute Care Choice: Skilled Nursing Facility Living arrangements for the past 2 months: Single Family Home                                      Prior Living Arrangements/Services Living arrangements for the past 2 months: Single Family Home Lives with:: Spouse, Adult Children Patient language and need for interpreter reviewed:: Yes Do you feel safe going back to the place where you live?: Yes          Current home services: Home RN, DME (walker, home O2) Criminal Activity/Legal Involvement Pertinent to Current Situation/Hospitalization: No - Comment as needed  Activities of Daily Living   ADL Screening (condition at time of admission) Independently performs ADLs?: Yes (appropriate  for developmental age) Is the patient deaf or have difficulty hearing?: No Does the patient have difficulty seeing, even when wearing glasses/contacts?: No Does the patient have difficulty concentrating, remembering, or making decisions?: No  Permission Sought/Granted                  Emotional Assessment         Alcohol  / Substance Use: Not Applicable Psych Involvement: No (comment)  Admission diagnosis:  Hypoxia [R09.02] Acute CVA (cerebrovascular accident) (HCC) [I63.9] Pneumonia due to COVID-19 virus [U07.1, J12.82] COVID-19 [U07.1] Patient Active Problem List   Diagnosis Date Noted   COVID-19 09/30/2023   CKD (chronic kidney disease) stage 4, GFR 15-29 ml/min (HCC) 09/29/2023   Nephrostomy present (HCC) 09/29/2023   Emphysema lung (HCC) 09/29/2023   Right renal atrophy 07/30/2023   Hypokalemia 05/19/2022   Pressure ulcer, heel 07/14/2021   Rigors 03/07/2021   Encounter for removal of vascular catheter 09/14/2020   Pneumonia due to COVID-19 virus 07/12/2020   Bacteremia due to Gram-negative bacteria 05/04/2020   Hydronephrosis 05/02/2020   Dehydration 03/02/2020   Nausea and vomiting 03/02/2020   Ventral hernia without obstruction or gangrene 03/01/2020   Hypotension 03/01/2020   Poor appetite 03/01/2020   Detrusor areflexia 09/16/2019   Cerebrovascular disease 08/17/2018   Partial small bowel obstruction (HCC) 07/11/2017   Choledocholithiasis 07/11/2017   Paroxysmal atrial tachycardia (HCC) 06/14/2017   Hypophosphatemia  06/14/2017   Pressure injury of skin 05/26/2017   Hydronephrosis due to obstruction of ureter 05/26/2017   Obstructive uropathy 05/25/2017   CKD (chronic kidney disease), stage III (HCC) 08/23/2016   Hydronephrosis of left kidney 08/23/2016   Kidney stones 09/13/2015   Essential hypertension    Pressure ulcer 08/04/2015   Chronic diastolic (congestive) heart failure (HCC) 08/04/2015   Hydronephrosis with obstructing calculus 08/04/2015    Occult blood positive stool 09/05/2014   Edema 08/19/2014   Dilated cardiomyopathy (HCC) 07/21/2014   Headache 03/13/2014   Cerebral infarction (HCC) 03/13/2014   Abnormality of gait 03/13/2014   Tremors of nervous system 10/08/2011   Hypothyroidism 10/08/2011   Pulmonary nodule 10/08/2011   Dysphagia 10/07/2011   HTN (hypertension), malignant 10/06/2011   Chronic suprapubic catheter (HCC) 10/06/2011   Pulmonary fibrosis (HCC) 10/06/2011   UTI (urinary tract infection) 10/03/2011   Junctional rhythm 10/02/2011   Multiple sclerosis (HCC) 10/02/2011   Chronic diarrhea 06/17/2011   Hx of adenomatous colonic polyps 06/17/2011   High grade dysplasia in colonic adenoma 09/29/2005   PCP:  Shona Norleen PEDLAR, MD Pharmacy:   [facility] - [place], [place] - [address] [place] [address] Phone: [phone] Fax: [phone]     Social Drivers of Health (SDOH) Social History: SDOH Screenings   Food Insecurity: No Food Insecurity (09/30/2023)  Housing: Unknown (09/30/2023)  Transportation Needs: No Transportation Needs (09/30/2023)  Utilities: Not At Risk (09/30/2023)  Depression (PHQ2-9): Low Risk  (09/26/2022)  Financial Resource Strain: Low Risk  (09/26/2022)  Social Connections: Socially Isolated (09/30/2023)  Stress: No Stress Concern Present (09/26/2022)  Tobacco Use: Medium Risk (09/29/2023)   SDOH Interventions:     Readmission Risk Interventions    08/25/2022    3:58 PM 07/15/2021    9:54 AM 02/15/2021   11:47 AM  Readmission Risk Prevention Plan  Transportation Screening Complete    PCP or Specialist Appt within 5-7 Days   Complete  PCP or Specialist Appt within 3-5 Days  Complete   Home Care Screening Complete  Complete  Medication Review (RN CM) Complete  Complete  HRI or Home Care Consult  Complete   Social Work Consult for Recovery Care Planning/Counseling  Complete   Palliative Care Screening  Complete   Medication Review Special Educational Needs Teacher)  Complete

## 2023-09-30 NOTE — Plan of Care (Signed)
  Problem: Education: Goal: Knowledge of risk factors and measures for prevention of condition will improve Outcome: Progressing   Problem: Coping: Goal: Psychosocial and spiritual needs will be supported Outcome: Progressing   Problem: Respiratory: Goal: Will maintain a patent airway Outcome: Progressing Goal: Complications related to the disease process, condition or treatment will be avoided or minimized Outcome: Progressing   Problem: Education: Goal: Knowledge of General Education information will improve Description: Including pain rating scale, medication(s)/side effects and non-pharmacologic comfort measures Outcome: Progressing   Problem: Health Behavior/Discharge Planning: Goal: Ability to manage health-related needs will improve Outcome: Progressing   Problem: Clinical Measurements: Goal: Ability to maintain clinical measurements within normal limits will improve Outcome: Progressing Goal: Will remain free from infection Outcome: Progressing Goal: Diagnostic test results will improve Outcome: Progressing Goal: Respiratory complications will improve Outcome: Progressing Goal: Cardiovascular complication will be avoided Outcome: Progressing   Problem: Activity: Goal: Risk for activity intolerance will decrease Outcome: Progressing   Problem: Nutrition: Goal: Adequate nutrition will be maintained Outcome: Progressing   Problem: Coping: Goal: Level of anxiety will decrease Outcome: Progressing   Problem: Elimination: Goal: Will not experience complications related to bowel motility Outcome: Progressing Goal: Will not experience complications related to urinary retention Outcome: Progressing   Problem: Pain Management: Goal: General experience of comfort will improve Outcome: Progressing   Problem: Safety: Goal: Ability to remain free from injury will improve Outcome: Progressing   Problem: Skin Integrity: Goal: Risk for impaired skin integrity will  decrease Outcome: Progressing   Problem: Education: Goal: Knowledge of disease or condition will improve Outcome: Progressing Goal: Knowledge of secondary prevention will improve (MUST DOCUMENT ALL) Outcome: Progressing Goal: Knowledge of patient specific risk factors will improve Alonso N/A or DELETE if not current risk factor) Outcome: Progressing   Problem: Ischemic Stroke/TIA Tissue Perfusion: Goal: Complications of ischemic stroke/TIA will be minimized Outcome: Progressing   Problem: Coping: Goal: Will verbalize positive feelings about self Outcome: Progressing Goal: Will identify appropriate support needs Outcome: Progressing   Problem: Health Behavior/Discharge Planning: Goal: Ability to manage health-related needs will improve Outcome: Progressing Goal: Goals will be collaboratively established with patient/family Outcome: Progressing   Problem: Self-Care: Goal: Ability to participate in self-care as condition permits will improve Outcome: Progressing Goal: Verbalization of feelings and concerns over difficulty with self-care will improve Outcome: Progressing Goal: Ability to communicate needs accurately will improve Outcome: Progressing   Problem: Nutrition: Goal: Risk of aspiration will decrease Outcome: Progressing Goal: Dietary intake will improve Outcome: Progressing

## 2023-10-01 DIAGNOSIS — N390 Urinary tract infection, site not specified: Secondary | ICD-10-CM

## 2023-10-01 DIAGNOSIS — T83510A Infection and inflammatory reaction due to cystostomy catheter, initial encounter: Secondary | ICD-10-CM

## 2023-10-01 DIAGNOSIS — N184 Chronic kidney disease, stage 4 (severe): Secondary | ICD-10-CM

## 2023-10-01 DIAGNOSIS — I10 Essential (primary) hypertension: Secondary | ICD-10-CM

## 2023-10-01 DIAGNOSIS — I420 Dilated cardiomyopathy: Secondary | ICD-10-CM

## 2023-10-01 DIAGNOSIS — Z936 Other artificial openings of urinary tract status: Secondary | ICD-10-CM

## 2023-10-01 DIAGNOSIS — G35 Multiple sclerosis: Secondary | ICD-10-CM

## 2023-10-01 DIAGNOSIS — E039 Hypothyroidism, unspecified: Secondary | ICD-10-CM

## 2023-10-01 DIAGNOSIS — U071 COVID-19: Secondary | ICD-10-CM | POA: Diagnosis not present

## 2023-10-01 DIAGNOSIS — R1310 Dysphagia, unspecified: Secondary | ICD-10-CM

## 2023-10-01 DIAGNOSIS — I5032 Chronic diastolic (congestive) heart failure: Secondary | ICD-10-CM

## 2023-10-01 LAB — COMPREHENSIVE METABOLIC PANEL
ALT: 20 U/L (ref 0–44)
AST: 28 U/L (ref 15–41)
Albumin: 2.2 g/dL — ABNORMAL LOW (ref 3.5–5.0)
Alkaline Phosphatase: 58 U/L (ref 38–126)
Anion gap: 10 (ref 5–15)
BUN: 59 mg/dL — ABNORMAL HIGH (ref 8–23)
CO2: 18 mmol/L — ABNORMAL LOW (ref 22–32)
Calcium: 8.2 mg/dL — ABNORMAL LOW (ref 8.9–10.3)
Chloride: 119 mmol/L — ABNORMAL HIGH (ref 98–111)
Creatinine, Ser: 2.51 mg/dL — ABNORMAL HIGH (ref 0.61–1.24)
GFR, Estimated: 25 mL/min — ABNORMAL LOW (ref >60)
Glucose, Bld: 178 mg/dL — ABNORMAL HIGH (ref 70–99)
Potassium: 3.5 mmol/L (ref 3.5–5.1)
Sodium: 147 mmol/L — ABNORMAL HIGH (ref 135–145)
Total Bilirubin: 0.4 mg/dL (ref 0.0–1.2)
Total Protein: 6.3 g/dL — ABNORMAL LOW (ref 6.5–8.1)

## 2023-10-01 LAB — CBC
HCT: 34.2 % — ABNORMAL LOW (ref 39.0–52.0)
Hemoglobin: 10.8 g/dL — ABNORMAL LOW (ref 13.0–17.0)
MCH: 29.1 pg (ref 26.0–34.0)
MCHC: 31.6 g/dL (ref 30.0–36.0)
MCV: 92.2 fL (ref 80.0–100.0)
Platelets: 142 10*3/uL — ABNORMAL LOW (ref 150–400)
RBC: 3.71 MIL/uL — ABNORMAL LOW (ref 4.22–5.81)
RDW: 15.9 % — ABNORMAL HIGH (ref 11.5–15.5)
WBC: 5.8 10*3/uL (ref 4.0–10.5)
nRBC: 0 % (ref 0.0–0.2)

## 2023-10-01 LAB — HEMOGLOBIN A1C
Hgb A1c MFr Bld: 6.1 % — ABNORMAL HIGH (ref 4.8–5.6)
Mean Plasma Glucose: 128 mg/dL

## 2023-10-01 LAB — C-REACTIVE PROTEIN: CRP: 33.3 mg/dL — ABNORMAL HIGH (ref <1.0)

## 2023-10-01 LAB — PROCALCITONIN: Procalcitonin: 6.71 ng/mL

## 2023-10-01 LAB — FERRITIN
Ferritin: 342 ng/mL — ABNORMAL HIGH (ref 24–336)
Ferritin: 360 ng/mL — ABNORMAL HIGH (ref 24–336)

## 2023-10-01 MED ORDER — ADULT MULTIVITAMIN W/MINERALS CH
1.0000 | ORAL_TABLET | Freq: Every day | ORAL | Status: DC
  Administered 2023-10-01 – 2023-10-11 (×11): 1 via ORAL
  Filled 2023-10-01 (×11): qty 1

## 2023-10-01 MED ORDER — IPRATROPIUM-ALBUTEROL 0.5-2.5 (3) MG/3ML IN SOLN
3.0000 mL | Freq: Three times a day (TID) | RESPIRATORY_TRACT | Status: DC
  Administered 2023-10-01 – 2023-10-04 (×9): 3 mL via RESPIRATORY_TRACT
  Filled 2023-10-01 (×9): qty 3

## 2023-10-01 MED ORDER — PANTOPRAZOLE SODIUM 40 MG PO TBEC
40.0000 mg | DELAYED_RELEASE_TABLET | Freq: Every day | ORAL | Status: DC
  Administered 2023-10-02 – 2023-10-12 (×11): 40 mg via ORAL
  Filled 2023-10-01 (×11): qty 1

## 2023-10-01 NOTE — Progress Notes (Signed)
 Speech Language Pathology Treatment: Dysphagia  Patient Details Name: Jordan Ward MRN: [id number] DOB: Jul 22, 1941 Today's Date: 10/01/2023 Time: 8579-8546 SLP Time Calculation (min) (ACUTE ONLY): 33 min  Assessment / Plan / Recommendation Clinical Impression  Pt seen for ongoing dysphagia intervention following BSE completed yesterday. SLP spoke with Pt and spouse who report that Pt has been consuming thin liquids at home. Last MBSS from 2023 is below with recommendation for either thins or HTL, due to Zenker's. While Pt was noted to aspirate thin liquids, he also had backflow of honey thick liquids from from the Zenker's to the pyriforms which may also be aspirated. Unfortunately, Pt is not necessarily any safer with thickened over thin liquids due to the multifactorial nature of his dysphagia (oropharyngeal and also esophageal component). Pt reports coughing with thin and thickened water . He indicates that he does not like thickened liquids, but doesn't mind the pre-thickened juices with meals. At home, he endorses drinking a soda in the morning, water  throughout the day, and Kool-Aid around dinner. Pt could consider ENT or GI consult for his Zenker's if they are interested in possible repair.   Given his dislike for thickened liquids and risk for aspiration with all liquids, Pt would like to have honey thickened liquids with meals and drink thin water  by cup after oral care throughout the day. SLP reiterated the importance of oral care to decrease the bacteria load given known history of aspiration. Can also consider allowing thin liquids across the board, given Pt's preference and his report that he will do this at home. Pt has COVID currently, but has not had any recent bouts of PNA. Pt will need assist for ORAL CARE at bedside. SLP will follow during acute stay, but MBSS likely not indicated at this time. Continue D2 and HTL with meals and allow free water  between meals and after oral care. Pt  and spouse are in agreement with plan of care.    HPI HPI: Jordan Ward, an 83 y/o with multiple co-morbidities including, MS, CKD4, neurogenic bladder with superpubic cystostomy catheter, obstructed left ureter with nephrostomy tube, COPD/Emphysema on home oxygen , hypothyroidism on replacement, dysphagia, non-healing wound right ankle in setting of PAD, HFpEF, cerebral vascular disease. Over the past several days he has been weaker, more short of breath, coughing, left-sided LE weakness, increased slurred speech. He denies having fever, N/V, diarrhea other than his chronic loose stools. He was here to fore able to manage many of his ADLs including bathing, eating, taking his medications. Due to his increased weakness, slurred speech, shortness of breath EMS was activated and he was brought to [facility] for evaluation. BSE requested   MBSS from 2023: <<Pt presents with moderate oropharyngeal phase dysphagia and primary esophageal dysphagia characterized by impaired lingual movement with reduced bolus cohesion, premature spillage, delay in swallow trigger at the level of the pyriforms with thins (valleculae with HTL and NTL), penetration of thins during the swallow and aspiration of thins (tsp/cup) after the swallow from residuals on the underside of the epiglottis and between the arytenoids running down between the vocal folds. Aspiration occurred in trace and moderate amounts and were generally sensed after a delay with a strong cough, but not removed. Pt also noted to have a Zenker's diverticulum which filled with barium and then backflowed into the pharynx (trace amounts aspirated after the swallow). Pt trialed a modified supraglottic and also a chin tuck, however found to be ineffective. Pt with reduced tongue base retraction with moderate vallecular residue  after swallowing regular textures. Pt encouraged to clear throat and repeat/dry swallow frequently throughout the study which did result and some removal from  the laryngeal vestibule. He has difficulty initiating a secondary/volitional swallow, which was also evident in 2016 study. Pt with seemingly improved pharyngeal clearance with honey-thick liquids (no penetration), however this also filled Zenker's and backflowed into the pyriforms.    Pt has a long standing history of dysphagia and has likely been aspirating liquids for years. He was deconditioned after COVID and also has a Zenker's diverticulum. He reports that he primarily consumes soft solids at home and water . Pt with similar performance on MBSS 10/25/2014, however noted increased size of Zenker's and now reflux into pharynx noted. Given long standing history of dysphagia, Pt is at risk for aspiration. He and his wife indicate no recent bouts of PNA.    Recommend continuing D3/mech soft with chopped meats and ok for thin liquids via small cup sip (swallow, cough, repeat/dry swallow) or consider honey-thick liquids during meals and free water  Teodora Water  protocol) between meals and after oral care. Pt needs to sit upright for all eating/drinking and remain up for at least 30 minutes after. Continue with po medications whole or crushed as able in puree and follow with liquid wash. Pt should avoid mixed consistencies (cold cereal with milk, broth soup with solids). Consider GI consult due to Zenker's diverticulum. Pt has previously expressed his desire to continue with thin liquids and goal will be to try to continue without thickening liquids (no great difference between performance with thins/nectars), however it may need to be considered if Pt acquires pneumonia. While, he did clear the honey-thick liquids well, they also were refluxed from the diverticulum back into the pharynx, increasing risk for aspiration of thickened liquids. Recommend either outpatient or home health short term SLP services for dysphagia for follow up of the above recommendations. This study was reviewed with the Pt and his wife.  His wife expressed interest in receiving home health SLP services. Pt will need to be monitored for shortness of breath, fever, and chest congestion. >>    SLP Plan  Continue with current plan of care      Recommendations for follow up therapy are one component of a multi-disciplinary discharge planning process, led by the attending physician.  Recommendations may be updated based on patient status, additional functional criteria and insurance authorization.    Recommendations  Diet recommendations: Dysphagia 2 (fine chop);Honey-thick liquid;Thin liquid (thin water  between meals) Liquids provided via: Cup Medication Administration: Crushed with puree Supervision: Patient able to self feed;Intermittent supervision to cue for compensatory strategies Compensations: Slow rate;Small sips/bites;Follow solids with liquid Postural Changes and/or Swallow Maneuvers: Seated upright 90 degrees;Upright 30-60 min after meal                  Staff/trained caregiver to provide oral care;Oral care prior to ice chip/H20;Oral care QID   Set up Supervision/Assistance Dysphagia, unspecified (R13.10)     Continue with current plan of care    Thank you,  Lamar Candy, CCC-SLP [phone]  Brandon Wiechman  10/01/2023, 2:55 PM

## 2023-10-01 NOTE — Plan of Care (Signed)
  Problem: Education: Goal: Knowledge of risk factors and measures for prevention of condition will improve Outcome: Progressing   Problem: Coping: Goal: Psychosocial and spiritual needs will be supported Outcome: Progressing   Problem: Respiratory: Goal: Will maintain a patent airway Outcome: Progressing Goal: Complications related to the disease process, condition or treatment will be avoided or minimized Outcome: Progressing   Problem: Education: Goal: Knowledge of General Education information will improve Description: Including pain rating scale, medication(s)/side effects and non-pharmacologic comfort measures Outcome: Progressing   Problem: Clinical Measurements: Goal: Will remain free from infection Outcome: Progressing Goal: Diagnostic test results will improve Outcome: Progressing

## 2023-10-01 NOTE — Progress Notes (Signed)
 Initial Nutrition Assessment  DOCUMENTATION CODES:   Not applicable  INTERVENTION:   Magic cup TID with meals, each supplement provides 290 kcal and 9 grams of protein. MVI with minerals daily.  NUTRITION DIAGNOSIS:   Increased nutrient needs related to wound healing as evidenced by estimated needs.  GOAL:   Patient will meet greater than or equal to 90% of their needs  MONITOR:   PO intake, Supplement acceptance, Skin  REASON FOR ASSESSMENT:   Malnutrition Screening Tool    ASSESSMENT:   83 yo male admitted with PNA d/t COVID-19 virus, UTI. PMH includes multiple sclerosis, arthritis, PVD, TIA, pulmonary fibrosis, on home oxygen , dysphagia, hypothroidism, HTN, chronic back pain, HLD, CAD, CVD, stroke, neurogenic bladder.  Unable to visit patient at this time. Unable to complete nutrition focused physical exam. On admission, patient reported 2-13 lb weight loss and poor intake d/t decreased appetite at home. Current weight appears to be carried over from previous weight encounter on 01/07/23. Unsure of actual weight. Unable to confirm reported weight loss at this time.   Patient has a hx of dysphagia. Per review of SLP note, patient was consuming thin liquids PTA. Given hx of longstanding dysphagia, SLP recommended a dysphagia 2 diet with honey thick liquids. Meal intakes have not been recorded, but suspect patient has been eating poorly, given acute illness with respiratory distress.   Labs reviewed. Na 147, BUN 59, creat 2.51  Medications reviewed and include solu-medrol , protonix , potassium chloride , thiamine , IV remdesivir , IV rocephin .  Diet Order:   Diet Order             DIET DYS 2 Room service appropriate? Yes; Fluid consistency: Honey Thick  Diet effective now                   EDUCATION NEEDS:   Not appropriate for education at this time  Skin:  Skin Assessment: Skin Integrity Issues: Skin Integrity Issues:: Stage IV, DTI DTI: bilateral  buttocks Stage IV: L ankle  Last BM:  12/31  Height:   Ht Readings from Last 1 Encounters:  09/29/23 5' 5 (1.651 m)    Weight:   Wt Readings from Last 1 Encounters:  09/29/23 50.8 kg    BMI:  Body mass index is 18.64 kg/m.  Estimated Nutritional Needs:   Kcal:  1600-1800  Protein:  80-90 gm  Fluid:  1.6-1.8 L   Suzen HUNT RD, LDN, CNSC Contact Inpatient RD using group chat RD Inpatient via Secure Chat in EPIC

## 2023-10-01 NOTE — Progress Notes (Signed)
 Progress Note   Patient: Jordan Ward FMW:[id number] DOB: 08/05/1941 DOA: 09/29/2023     2 DOS: the patient was seen and examined on 10/01/2023   Brief hospital admission course: As per H&P written by Dr. Harlow on 12 31st 24 Mr. Fusco, an 83 y/o with multiple co-morbidities including, MS, CKD4, neurogenic bladder with superpubic cystostomy catheter, obstructed left ureter with nephrostomy tube, COPD/Emphysema on home oxygen , hypothyroidism on replacement, dysphagia, non-healing wound right ankle in setting of PAD, HFpEF, cerebral vascular disease. Over the past several days he has been weaker, more short of breath, coughing, left-sided LE weakness, increased slurred speech. He denies having fever, N/V, diarrhea other than his chronic loose stools. He was here to fore able to manage many of his ADLs including bathing, eating, taking his medications. Due to his increased weakness, slurred speech, shortness of breath EMS was activated and he was brought to [facility] for evaluation.     ED Course: T 98.2  94/66  HR 79  RR 20. A very thin, chronically ill appearing man in no distress. Able to give history and state his care wishes. Lab: glucose 144, BUN/Cr 60/3.97 (baseline 26/2.0), BNP 488, Troponin 46 to 35, C BC nl, U/A cloud, Large LE, many baceria, > 50 WBC/hpf. CXR - multifocal pneumonia R>L, advanced emphysema. MRI - p8untate infarcts left cerebeller, right and left occipital lobes, multiple vascular stenosis and occlusin right [place] group.   In ED patient given Rocephin  1g, decadron . EDP consulted Neurology. TRH asked to admit for medical management covid pneumonia and other co-morbidities.   Assessment and Plan: * Pneumonia due to COVID-19 virus - remdesivir  infusions has been completed. -Continue treatment with steroids, mucolytic's, flutter valve and bronchodilator management -Continue home oxygen  supplementation -Continue to follow inflammatory markers -Will continue supportive care and follow  clinical response. -Checking procalcitonin; due to underlying UTI also receiving ceftriaxone  which will provide coverage in the case of any superimposed bacterial infection.  Cerebrovascular disease -Complete stroke workup -Follow neurology service recommendation -Prior to admission using aspirin  for secondary prevention -Patient out of the window for any thrombolytic management or intervention. -Will provide treatment with aspirin  and Plavix  for secondary prevention. -Per PT/OT recommendations patient will need a skilled nursing facility at discharge -[facility] aware and helping with placement.  UTI (urinary tract infection) -Associated with chronic suprapubic catheter -Follow urine culture -Continue current IV antibiotics.  Emphysema lung (HCC) -Continue oxygen  supplementation -Continue bronchodilator management -Receiving steroids as part of treatment for COVID -Follow clinical response and continue supportive care.  Nephrostomy present (HCC) -Continue nephrostomy tube care -Continue current antibiotic for UTI -Continue patient follow-up with urology service.  CKD (chronic kidney disease) stage 4, GFR 15-29 ml/min (HCC) -Continue to follow renal function trend -Continue minimizing nephrotoxic agents and avoid the use of contrast and hypotension. -Good urine output currently appreciated.  Chronic diastolic (congestive) heart failure (HCC) -Elevated BNP was present at time of admission -Some decreased breath sounds reported by admitting doctor patient's lung bases. -Provided on 09/30/2023 -Currently appears to be euvolemic; will hold on any further diuresis for the moment. -Follow daily weights and urine output -Low-sodium diet discussed with patient -No frank exacerbation currently present.  Given underlying history of will like to keep lungs on the dry side.  Essential hypertension -Stable for the most part -Continue to follow vital signs.  Dilated cardiomyopathy  (HCC) -Follow 2D echo results. -No significant signs of fluid overload on exam. -Continue to follow daily weights and strict I's and O's.  Hypothyroidism -TSH elevated -Continue Synthroid .  Dysphagia -Long standing problem which he attributes to MS. Now with evidence of new CVA -Appreciate assistance and recommendation by speech therapy; dysphagia 2 diet with honey thick liquids recommended while inpatient.  Chronic suprapubic catheter (HCC) -Catheter site appears clean and normal -No erythematous changes or drainage -Continue current IV antibiotics -Continue local care. -Continue patient follow-up with urology service.  Multiple sclerosis (HCC) -No receiving any medications at the moment -Continue outpatient follow-up with neurology.   Subjective:  No fever, no chest pain, no nausea, no vomiting.  Patient reports feeling better and so far tolerating dysphagia 2 with honey thick liquids.  Physical Exam: Vitals:   09/30/23 2329 10/01/23 0239 10/01/23 0939 10/01/23 1452  BP: 110/66 101/75    Pulse: 95 78    Resp:  18    Temp:  (!) 97.5 F (36.4 C)    TempSrc:      SpO2: 100% 97% 92% 96%  Weight:      Height:       General exam: Alert, awake, oriented x 3; reports feeling better, currently no chest pain, no nausea, no vomiting.  Patient afebrile. Respiratory system: Positive rhonchi bilaterally; no using accessory muscles. Cardiovascular system:RRR. No rubs or gallops; no JVD. Gastrointestinal system: Abdomen is nondistended, soft and with positive bowel sounds.  Positive nephrostomy tube and suprapubic catheter in place. Central nervous system: No new focal neurological deficits. Extremities: No cyanosis, clubbing or edema. Skin: No petechiae. Psychiatry: Judgement and insight appear normal.  Flat affect appreciated on exam.  Data Reviewed: A1c: Pending 2D echo: Pending Lipid panel: Total cholesterol 42, HDL 14, LDL 16 TSH: 6.285 Umnenwpw:53>> 35 Ferritin 342,   procalcitonin: 6.71 CRP: 33.3 CBC: WBCs 5.8, hemoglobin 10.8 and platelet count 142K Comprehensive metabolic panel: Sodium 147, potassium 3.5, chloride 119, bicarb 18, BUN 59, creatinine 2.51, normal LFTs and GFR 25   Family Communication: No family at bedside at time of evaluation.  Disposition: Status is: Inpatient Remains inpatient appropriate because: IV and completion stroke work up    Planned Discharge Destination: Skilled nursing facility  Time spent: 50 minutes  Author: Eric Nunnery, MD 10/01/2023 4:02 PM  For on call review [email or web address].

## 2023-10-01 NOTE — Plan of Care (Signed)
  Problem: Respiratory: Goal: Will maintain a patent airway Outcome: Progressing   Problem: Respiratory: Goal: Complications related to the disease process, condition or treatment will be avoided or minimized Outcome: Progressing   

## 2023-10-02 DIAGNOSIS — U071 COVID-19: Secondary | ICD-10-CM

## 2023-10-02 DIAGNOSIS — J1282 Pneumonia due to coronavirus disease 2019: Secondary | ICD-10-CM | POA: Diagnosis not present

## 2023-10-02 LAB — CULTURE, BLOOD (ROUTINE X 2)
Special Requests: ADEQUATE
Special Requests: ADEQUATE

## 2023-10-02 LAB — BASIC METABOLIC PANEL
Anion gap: 8 (ref 5–15)
BUN: 70 mg/dL — ABNORMAL HIGH (ref 8–23)
CO2: 18 mmol/L — ABNORMAL LOW (ref 22–32)
Calcium: 8.3 mg/dL — ABNORMAL LOW (ref 8.9–10.3)
Chloride: 120 mmol/L — ABNORMAL HIGH (ref 98–111)
Creatinine, Ser: 2.59 mg/dL — ABNORMAL HIGH (ref 0.61–1.24)
GFR, Estimated: 24 mL/min — ABNORMAL LOW (ref >60)
Glucose, Bld: 193 mg/dL — ABNORMAL HIGH (ref 70–99)
Potassium: 3.4 mmol/L — ABNORMAL LOW (ref 3.5–5.1)
Sodium: 146 mmol/L — ABNORMAL HIGH (ref 135–145)

## 2023-10-02 LAB — CBC
HCT: 33.9 % — ABNORMAL LOW (ref 39.0–52.0)
Hemoglobin: 11 g/dL — ABNORMAL LOW (ref 13.0–17.0)
MCH: 29.3 pg (ref 26.0–34.0)
MCHC: 32.4 g/dL (ref 30.0–36.0)
MCV: 90.4 fL (ref 80.0–100.0)
Platelets: 158 10*3/uL (ref 150–400)
RBC: 3.75 MIL/uL — ABNORMAL LOW (ref 4.22–5.81)
RDW: 16.1 % — ABNORMAL HIGH (ref 11.5–15.5)
WBC: 6.2 10*3/uL (ref 4.0–10.5)
nRBC: 0.5 % — ABNORMAL HIGH (ref 0.0–0.2)

## 2023-10-02 LAB — MAGNESIUM: Magnesium: 2 mg/dL (ref 1.7–2.4)

## 2023-10-02 LAB — T4, FREE: Free T4: 1.07 ng/dL (ref 0.61–1.12)

## 2023-10-02 MED ORDER — POTASSIUM CHLORIDE CRYS ER 20 MEQ PO TBCR
40.0000 meq | EXTENDED_RELEASE_TABLET | Freq: Two times a day (BID) | ORAL | Status: AC
  Administered 2023-10-02 (×2): 40 meq via ORAL
  Filled 2023-10-02 (×2): qty 2

## 2023-10-02 NOTE — Progress Notes (Signed)
 Speech Language Pathology Treatment: Dysphagia  Patient Details Name: Jordan Ward MRN: [id number] DOB: [birth year] Today's Date: 10/02/2023 Time: 8764-8690 SLP Time Calculation (min) (ACUTE ONLY): 34 min  Assessment / Plan / Recommendation Clinical Impression  Ongoing diagnostic dysphagia therapy provided today; Pt consumed thin liquids via cup with occasional delayed coughing and throat clearing. Pt is requesting to be placed back on a soft diet with real water . As previously mentioned in ST note yesterday, Pt is not necessarily any safer with thickened over thin liquids due to the multifactorial nature of his dysphagia (oropharyngeal and also esophageal component). Given Pt preference and that he has been having thin liquids for years at home and plans to return to having thin liquids upon D/C home recommend upgrade diet to D3/mech soft and THIN LIQUIDS. SLP reiterated the importance of oral care to decrease the bacteria load given known history of aspiration. New diet recommendation and recommendation for vigilant oral care reviewed at length with Pt's nurse. Please continue to crush medications. There are no further ST needs indicated at this time. Please re-consult if indicated, thank you for allowing us  to participate in the care of this patient.    HPI HPI: Jordan Ward, an 83 y/o with multiple co-morbidities including, MS, CKD4, neurogenic bladder with superpubic cystostomy catheter, obstructed left ureter with nephrostomy tube, COPD/Emphysema on home oxygen , hypothyroidism on replacement, dysphagia, non-healing wound right ankle in setting of PAD, HFpEF, cerebral vascular disease. Over the past several days he has been weaker, more short of breath, coughing, left-sided LE weakness, increased slurred speech. He denies having fever, N/V, diarrhea other than his chronic loose stools. He was here to fore able to manage many of his ADLs including bathing, eating, taking his medications. Due to his  increased weakness, slurred speech, shortness of breath EMS was activated and he was brought to [facility] for evaluation. BSE requested      SLP Plan  Continue with current plan of care      Recommendations for follow up therapy are one component of a multi-disciplinary discharge planning process, led by the attending physician.  Recommendations may be updated based on patient status, additional functional criteria and insurance authorization.    Recommendations  Diet recommendations: Dysphagia 3 (mechanical soft);Thin liquid Liquids provided via: Cup Medication Administration: Crushed with puree Supervision: Patient able to self feed;Intermittent supervision to cue for compensatory strategies Compensations: Slow rate;Small sips/bites;Follow solids with liquid Postural Changes and/or Swallow Maneuvers: Seated upright 90 degrees;Upright 30-60 min after meal                  Staff/trained caregiver to provide oral care;Oral care prior to ice chip/H20;Oral care QID   Set up Supervision/Assistance Dysphagia, unspecified (R13.10)     Continue with current plan of care    Jordan Ward H. Jordan Ward, CCC-SLP Speech Language Pathologist  Jordan Ward  10/02/2023, 1:10 PM

## 2023-10-02 NOTE — Plan of Care (Signed)
  Problem: Education: Goal: Knowledge of risk factors and measures for prevention of condition will improve Outcome: Progressing   Problem: Coping: Goal: Psychosocial and spiritual needs will be supported Outcome: Progressing   Problem: Respiratory: Goal: Will maintain a patent airway Outcome: Progressing Goal: Complications related to the disease process, condition or treatment will be avoided or minimized Outcome: Progressing   Problem: Education: Goal: Knowledge of General Education information will improve Description: Including pain rating scale, medication(s)/side effects and non-pharmacologic comfort measures Outcome: Progressing   Problem: Health Behavior/Discharge Planning: Goal: Ability to manage health-related needs will improve Outcome: Progressing   Problem: Clinical Measurements: Goal: Ability to maintain clinical measurements within normal limits will improve Outcome: Progressing Goal: Will remain free from infection Outcome: Progressing Goal: Diagnostic test results will improve Outcome: Progressing Goal: Respiratory complications will improve Outcome: Progressing Goal: Cardiovascular complication will be avoided Outcome: Progressing   Problem: Activity: Goal: Risk for activity intolerance will decrease Outcome: Progressing   Problem: Nutrition: Goal: Adequate nutrition will be maintained Outcome: Progressing   Problem: Coping: Goal: Level of anxiety will decrease Outcome: Progressing   Problem: Elimination: Goal: Will not experience complications related to bowel motility Outcome: Progressing Goal: Will not experience complications related to urinary retention Outcome: Progressing   Problem: Pain Management: Goal: General experience of comfort will improve Outcome: Progressing   Problem: Safety: Goal: Ability to remain free from injury will improve Outcome: Progressing   Problem: Skin Integrity: Goal: Risk for impaired skin integrity will  decrease Outcome: Progressing   Problem: Education: Goal: Knowledge of disease or condition will improve Outcome: Progressing Goal: Knowledge of secondary prevention will improve (MUST DOCUMENT ALL) Outcome: Progressing Goal: Knowledge of patient specific risk factors will improve Alonso N/A or DELETE if not current risk factor) Outcome: Progressing   Problem: Ischemic Stroke/TIA Tissue Perfusion: Goal: Complications of ischemic stroke/TIA will be minimized Outcome: Progressing   Problem: Coping: Goal: Will verbalize positive feelings about self Outcome: Progressing Goal: Will identify appropriate support needs Outcome: Progressing   Problem: Health Behavior/Discharge Planning: Goal: Ability to manage health-related needs will improve Outcome: Progressing Goal: Goals will be collaboratively established with patient/family Outcome: Progressing   Problem: Self-Care: Goal: Ability to participate in self-care as condition permits will improve Outcome: Progressing Goal: Verbalization of feelings and concerns over difficulty with self-care will improve Outcome: Progressing Goal: Ability to communicate needs accurately will improve Outcome: Progressing   Problem: Nutrition: Goal: Risk of aspiration will decrease Outcome: Progressing Goal: Dietary intake will improve Outcome: Progressing

## 2023-10-02 NOTE — Progress Notes (Signed)
 PROGRESS NOTE    Jordan Ward  FMW:[id number] DOB: 20-Dec-1940 DOA: 09/29/2023 PCP: Shona Norleen PEDLAR, MD   Brief Narrative:    Mr. Osmundson, an 83 y/o with multiple co-morbidities including, MS, CKD4, neurogenic bladder with superpubic cystostomy catheter, obstructed left ureter with nephrostomy tube, COPD/Emphysema on home oxygen , hypothyroidism on replacement, dysphagia, non-healing wound right ankle in setting of PAD, HFpEF, cerebral vascular disease. Over the past several days he has been weaker, more short of breath, coughing, left-sided LE weakness, increased slurred speech.  Patient was admitted with COVID-pneumonia as well as significant cerebrovascular disease with small, acute CVA.  He is now noted to have strep pneumonia bacteremia as well.  Assessment & Plan:   Principal Problem:   Pneumonia due to COVID-19 virus Active Problems:   UTI (urinary tract infection)   Cerebrovascular disease   Chronic diastolic (congestive) heart failure (HCC)   CKD (chronic kidney disease) stage 4, GFR 15-29 ml/min (HCC)   Nephrostomy present (HCC)   Emphysema lung (HCC)   Multiple sclerosis (HCC)   Chronic suprapubic catheter (HCC)   Dysphagia   Hypothyroidism   Dilated cardiomyopathy (HCC)   Essential hypertension   COVID-19  Assessment and Plan:   Pneumonia due to COVID-19 virus - remdesivir  infusions has been completed. -Continue treatment with steroids, mucolytic's, flutter valve and bronchodilator management -Continue home oxygen  supplementation -Continue to follow inflammatory markers -Will continue supportive care and follow clinical response. -Checking procalcitonin; due to underlying UTI also receiving ceftriaxone  which will provide coverage in the case of any superimposed bacterial infection.   Cerebrovascular disease with small ischemic CVA -Complete stroke workup -Follow neurology service recommendation -Prior to admission using aspirin  for secondary prevention -Patient out of  the window for any thrombolytic management or intervention. -Will provide treatment with aspirin  and Plavix  for secondary prevention. -Per PT/OT recommendations patient will need a skilled nursing facility at discharge -LDL 16 -TOC aware and helping with placement.   Strep pneumonia bacteremia -Associated with chronic suprapubic catheter -Continue current IV antibiotics with sensitivity noted to Rocephin    Emphysema lung (HCC) -Continue oxygen  supplementation -Continue bronchodilator management -Receiving steroids as part of treatment for COVID -Follow clinical response and continue supportive care.   Nephrostomy present (HCC) -Continue nephrostomy tube care -Continue current antibiotic for UTI -Continue patient follow-up with urology service.   CKD (chronic kidney disease) stage 4, GFR 15-29 ml/min (HCC) -Continue to follow renal function trend -Continue minimizing nephrotoxic agents and avoid the use of contrast and hypotension. -Good urine output currently appreciated.   Chronic diastolic (congestive) heart failure (HCC) -Elevated BNP was present at time of admission -Some decreased breath sounds reported by admitting doctor patient's lung bases. -Provided on 09/30/2023 -Currently appears to be euvolemic; will hold on any further diuresis for the moment. -Follow daily weights and urine output -Low-sodium diet discussed with patient -No frank exacerbation currently present.  Given underlying history of will like to keep lungs on the dry side.   Essential hypertension -Stable for the most part -Continue to follow vital signs.   Dilated cardiomyopathy (HCC) -2D echocardiogram with LVEF 30-35% with global hypokinesis and grade 1 diastolic dysfunction -No significant signs of fluid overload on exam. -Continue to follow daily weights and strict I's and O's.   Hypothyroidism -TSH elevated -Check free T4 -Continue Synthroid .  Mild hypokalemia -Replete and reevaluate in  a.m.   Dysphagia -Long standing problem which he attributes to MS. Now with evidence of new CVA -Appreciate assistance and recommendation by speech therapy;  dysphagia 2 diet with honey thick liquids recommended while inpatient.   Chronic suprapubic catheter (HCC) -Catheter site appears clean and normal -No erythematous changes or drainage -Continue current IV antibiotics -Continue local care. -Continue patient follow-up with urology service.   Multiple sclerosis (HCC) -No receiving any medications at the moment -Continue outpatient follow-up with neurology.  DVT prophylaxis:Heparin  Code Status: Full Family Communication: None at bedside Disposition Plan:  Status is: Inpatient Remains inpatient appropriate because: Need for placement and IV antibiotics.  Skin Assessment:  I have examined the patient's skin and I agree with the wound assessment as performed by the wound care RN as outlined below:  Pressure Injury 09/30/23 Ankle Left;Lateral Stage 4 - Full thickness tissue loss with exposed bone, tendon or muscle. 1x1cm open area to outside of right ankle, (Active)  09/30/23 1301  Location: Ankle  Location Orientation: Left;Lateral  Staging: Stage 4 - Full thickness tissue loss with exposed bone, tendon or muscle.  Wound Description (Comments): 1x1cm open area to outside of right ankle,  Present on Admission: Yes  Dressing Type Foam - Lift dressing to assess site every shift 10/02/23 0100     Pressure Injury 09/30/23 Buttocks Right;Left Deep Tissue Pressure Injury - Purple or maroon localized area of discolored intact skin or blood-filled blister due to damage of underlying soft tissue from pressure and/or shear. red nonblanchable area to sac (Active)  09/30/23 1130  Location: Buttocks  Location Orientation: Right;Left  Staging: Deep Tissue Pressure Injury - Purple or maroon localized area of discolored intact skin or blood-filled blister due to damage of underlying soft tissue  from pressure and/or shear.  Wound Description (Comments): red nonblanchable area to sacrum and buttocks with purple are in the center, under right buttock a 0.5x0.5 open area  Present on Admission: Yes  Dressing Type Foam - Lift dressing to assess site every shift 10/02/23 0100    Consultants:  None  Procedures:  None  Antimicrobials:  Anti-infectives (From admission, onward)    Start     Dose/Rate Route Frequency Ordered Stop   10/01/23 1600  cefTRIAXone  (ROCEPHIN ) 2 g in sodium chloride  0.9 % 100 mL IVPB        2 g 200 mL/hr over 30 Minutes Intravenous Every 24 hours 09/30/23 1940     09/30/23 2030  cefTRIAXone  (ROCEPHIN ) 1 g in sodium chloride  0.9 % 100 mL IVPB        1 g 200 mL/hr over 30 Minutes Intravenous  Once 09/30/23 1940 10/01/23 1416   09/30/23 1600  cefTRIAXone  (ROCEPHIN ) 1 g in sodium chloride  0.9 % 100 mL IVPB  Status:  Discontinued        1 g 200 mL/hr over 30 Minutes Intravenous Every 24 hours 09/29/23 2318 09/30/23 1940   09/30/23 1000  remdesivir  100 mg in sodium chloride  0.9 % 100 mL IVPB  Status:  Discontinued       Placed in Followed by Linked Group   100 mg 200 mL/hr over 30 Minutes Intravenous Daily 09/29/23 2317 09/29/23 2318   09/30/23 1000  remdesivir  100 mg in sodium chloride  0.9 % 100 mL IVPB       Placed in Followed by Linked Group   100 mg 200 mL/hr over 30 Minutes Intravenous Daily 09/29/23 2319 10/01/23 0902   09/29/23 2348  remdesivir  100 mg in sodium chloride  0.9 % 100 mL IVPB       Placed in Followed by Linked Group   100 mg 200 mL/hr over 30 Minutes Intravenous  Once 09/29/23 2319 09/30/23 0222   09/29/23 2330  remdesivir  100 mg in sodium chloride  0.9 % 100 mL IVPB       Placed in Followed by Linked Group   100 mg 200 mL/hr over 30 Minutes Intravenous  Once 09/29/23 2319 09/30/23 0118   09/29/23 2315  remdesivir  200 mg in sodium chloride  0.9% 250 mL IVPB  Status:  Discontinued       Placed in Followed by Linked Group   200  mg 580 mL/hr over 30 Minutes Intravenous Once 09/29/23 2317 09/29/23 2318   09/29/23 1515  cefTRIAXone  (ROCEPHIN ) 1 g in sodium chloride  0.9 % 100 mL IVPB        1 g 200 mL/hr over 30 Minutes Intravenous  Once 09/29/23 1506 09/29/23 1627   09/29/23 1515  azithromycin  (ZITHROMAX ) 500 mg in sodium chloride  0.9 % 250 mL IVPB        500 mg 250 mL/hr over 60 Minutes Intravenous  Once 09/29/23 1506 09/29/23 1924      Subjective: Patient seen and evaluated today with no new acute complaints or concerns. No acute concerns or events noted overnight.  Objective: Vitals:   10/01/23 1945 10/01/23 2133 10/02/23 0100 10/02/23 0345  BP:  104/71 121/78 136/79  Pulse:  90 75 81  Resp:   20 20  Temp:  98.3 F (36.8 C) (!) 97.4 F (36.3 C) 98.1 F (36.7 C)  TempSrc:  Oral    SpO2: 97% 97% 94% 95%  Weight:      Height:        Intake/Output Summary (Last 24 hours) at 10/02/2023 0729 Last data filed at 10/02/2023 9372 Gross per 24 hour  Intake 600 ml  Output 1700 ml  Net -1100 ml   Filed Weights   09/29/23 1331  Weight: 50.8 kg    Examination:  General exam: Appears calm and comfortable  Respiratory system: Clear to auscultation. Respiratory effort normal.  2 L nasal cannula Cardiovascular system: S1 & S2 heard, RRR.  Gastrointestinal system: Abdomen is soft Central nervous system: Alert and awake Extremities: No edema Skin: No significant lesions noted Psychiatry: Flat affect. Catheter is present and C/D/I    Data Reviewed: I have personally reviewed following labs and imaging studies  CBC: Recent Labs  Lab 09/29/23 1355 10/01/23 0435 10/02/23 0339  WBC 6.5 5.8 6.2  HGB 11.9* 10.8* 11.0*  HCT 36.9* 34.2* 33.9*  MCV 92.9 92.2 90.4  PLT 136* 142* 158   Basic Metabolic Panel: Recent Labs  Lab 09/29/23 1355 10/01/23 0435 10/02/23 0339  NA 140 147* 146*  K 3.8 3.5 3.4*  CL 109 119* 120*  CO2 21* 18* 18*  GLUCOSE 144* 178* 193*  BUN 60* 59* 70*  CREATININE 3.97*  2.51* 2.59*  CALCIUM  8.6* 8.2* 8.3*  MG  --   --  2.0   GFR: Estimated Creatinine Clearance: 16.3 mL/min (A) (by C-G formula based on SCr of 2.59 mg/dL (H)). Liver Function Tests: Recent Labs  Lab 09/29/23 1411 10/01/23 0435  AST 34 28  ALT 19 20  ALKPHOS 69 58  BILITOT 0.7 0.4  PROT 7.4 6.3*  ALBUMIN 3.0* 2.2*   Recent Labs  Lab 09/29/23 1411  LIPASE 21   No results for input(s): AMMONIA in the last 168 hours. Coagulation Profile: No results for input(s): INR, PROTIME in the last 168 hours. Cardiac Enzymes: No results for input(s): CKTOTAL, CKMB, CKMBINDEX, TROPONINI in the last 168 hours. BNP (last 3 results) No results  for input(s): PROBNP in the last 8760 hours. HbA1C: Recent Labs    09/30/23 0545  HGBA1C 6.1*   CBG: Recent Labs  Lab 09/29/23 1402  GLUCAP 139*   Lipid Profile: Recent Labs    09/30/23 0545  CHOL 42  HDL 14*  LDLCALC 16  TRIG 62  CHOLHDL 3.0   Thyroid  Function Tests: Recent Labs    09/29/23 1629  TSH 6.285*   Anemia Panel: Recent Labs    10/01/23 0435  FERRITIN 360*  342*   Sepsis Labs: Recent Labs  Lab 10/01/23 0435  PROCALCITON 6.71    Recent Results (from the past 240 hours)  Resp panel by RT-PCR (RSV, Flu A&B, Covid) Anterior Nasal Swab     Status: Abnormal   Collection Time: 09/29/23 12:13 PM   Specimen: Anterior Nasal Swab  Result Value Ref Range Status   SARS Coronavirus 2 by RT PCR POSITIVE (A) NEGATIVE Final    Comment: (NOTE) SARS-CoV-2 target nucleic acids are DETECTED.  The SARS-CoV-2 RNA is generally detectable in upper respiratory specimens during the acute phase of infection. Positive results are indicative of the presence of the identified virus, but do not rule out bacterial infection or co-infection with other pathogens not detected by the test. Clinical correlation with patient history and other diagnostic information is necessary to determine patient infection status. The  expected result is Negative.  Fact Sheet for Patients: bloggercourse.com  Fact Sheet for Healthcare Providers: seriousbroker.it  This test is not yet approved or cleared by the [place]  FDA and  has been authorized for detection and/or diagnosis of SARS-CoV-2 by FDA under an Emergency Use Authorization (EUA).  This EUA will remain in effect (meaning this test can be used) for the duration of  the COVID-19 declaration under Section 564(b)(1) of the A ct, 21 U.S.C. section 360bbb-3(b)(1), unless the authorization is terminated or revoked sooner.     Influenza A by PCR NEGATIVE NEGATIVE Final   Influenza B by PCR NEGATIVE NEGATIVE Final    Comment: (NOTE) The Xpert Xpress SARS-CoV-2/FLU/RSV plus assay is intended as an aid in the diagnosis of influenza from Nasopharyngeal swab specimens and should not be used as a sole basis for treatment. Nasal washings and aspirates are unacceptable for Xpert Xpress SARS-CoV-2/FLU/RSV testing.  Fact Sheet for Patients: bloggercourse.com  Fact Sheet for Healthcare Providers: seriousbroker.it  This test is not yet approved or cleared by the [place]  FDA and has been authorized for detection and/or diagnosis of SARS-CoV-2 by FDA under an Emergency Use Authorization (EUA). This EUA will remain in effect (meaning this test can be used) for the duration of the COVID-19 declaration under Section 564(b)(1) of the Act, 21 U.S.C. section 360bbb-3(b)(1), unless the authorization is terminated or revoked.     Resp Syncytial Virus by PCR NEGATIVE NEGATIVE Final    Comment: (NOTE) Fact Sheet for Patients: bloggercourse.com  Fact Sheet for Healthcare Providers: seriousbroker.it  This test is not yet approved or cleared by the [place]  FDA and has been authorized for detection and/or  diagnosis of SARS-CoV-2 by FDA under an Emergency Use Authorization (EUA). This EUA will remain in effect (meaning this test can be used) for the duration of the COVID-19 declaration under Section 564(b)(1) of the Act, 21 U.S.C. section 360bbb-3(b)(1), unless the authorization is terminated or revoked.  Performed at [facility], [address], [place], [place] [address]   Culture, blood (Routine x 2)     Status: Abnormal (Preliminary result)   Collection  Time: 09/29/23  2:11 PM   Specimen: BLOOD RIGHT FOREARM  Result Value Ref Range Status   Specimen Description   Final    BLOOD RIGHT FOREARM Performed at [facility], [address], [place], [place] [address]    Special Requests   Final    Blood Culture adequate volume BOTTLES DRAWN AEROBIC AND ANAEROBIC Performed at [facility], [address] [place], [place] [address]    Culture  Setup Time   Final    GRAM POSITIVE COCCI IN BOTH AEROBIC AND ANAEROBIC BOTTLES Gram Stain Report Called to,Read Back By and Verified With: CHARMAINE BREEN [phone], VIRAY,J GRAM STAIN REVIEWED-AGREE WITH RESULT DRT CRITICAL RESULT CALLED TO, READ BACK BY AND VERIFIED WITH: RN JENNIFER HELBERG ON 09/30/23 @ 1909 BY DRT    Culture (A)  Final    STREPTOCOCCUS PNEUMONIAE SUSCEPTIBILITIES TO FOLLOW Performed at [facility], [address], [place], [place] [address]    Report Status PENDING  Incomplete  Culture, blood (Routine x 2)     Status: Abnormal (Preliminary result)   Collection Time: 09/29/23  2:11 PM   Specimen: BLOOD LEFT HAND  Result Value Ref Range Status   Specimen Description   Final    BLOOD LEFT HAND Performed at [facility], [address], [place], [place] [address]    Special Requests   Final    BOTTLES DRAWN AEROBIC AND ANAEROBIC Blood Culture adequate volume Performed at [facility], [address], [place], [place] [address]    Culture  Setup Time   Final    GRAM POSITIVE COCCI IN BOTH AEROBIC AND  ANAEROBIC BOTTLES Gram Stain Report Called to,Read Back By and Verified With: CHARMAINE BREEN [phone], COLLIN JINNY SETTLE STAIN REVIEWED-AGREE WITH RESULT DRT    Culture (A)  Final    STREPTOCOCCUS PNEUMONIAE STAPHYLOCOCCUS CAPITIS THE SIGNIFICANCE OF ISOLATING THIS ORGANISM FROM A SINGLE SET OF BLOOD CULTURES WHEN MULTIPLE SETS ARE DRAWN IS UNCERTAIN. PLEASE NOTIFY THE MICROBIOLOGY DEPARTMENT WITHIN ONE WEEK IF SPECIATION AND SENSITIVITIES ARE REQUIRED. Performed at [facility], [address] [place], [place] [address]    Report Status PENDING  Incomplete  Blood Culture ID Panel (Reflexed)     Status: Abnormal   Collection Time: 09/29/23  2:11 PM  Result Value Ref Range Status   Enterococcus faecalis NOT DETECTED NOT DETECTED Final   Enterococcus Faecium NOT DETECTED NOT DETECTED Final   Listeria monocytogenes NOT DETECTED NOT DETECTED Final   Staphylococcus species NOT DETECTED NOT DETECTED Final   Staphylococcus aureus (BCID) NOT DETECTED NOT DETECTED Final   Staphylococcus epidermidis NOT DETECTED NOT DETECTED Final   Staphylococcus lugdunensis NOT DETECTED NOT DETECTED Final   Streptococcus species DETECTED (A) NOT DETECTED Final    Comment: CRITICAL RESULT CALLED TO, READ BACK BY AND VERIFIED WITH: RN JENNIFER HELBERG ON 09/30/23 @ 1909 BY DRT    Streptococcus agalactiae NOT DETECTED NOT DETECTED Final   Streptococcus pneumoniae DETECTED (A) NOT DETECTED Final    Comment: CRITICAL RESULT CALLED TO, READ BACK BY AND VERIFIED WITH: RN JENNIFER HELBERG ON 09/30/23 @ 1909 BY DRT    Streptococcus pyogenes NOT DETECTED NOT DETECTED Final   A.calcoaceticus-baumannii NOT DETECTED NOT DETECTED Final   Bacteroides fragilis NOT DETECTED NOT DETECTED Final   Enterobacterales NOT DETECTED NOT DETECTED Final   Enterobacter cloacae complex NOT DETECTED NOT DETECTED Final   Escherichia coli NOT DETECTED NOT DETECTED Final   Klebsiella aerogenes NOT DETECTED  NOT DETECTED Final    Klebsiella oxytoca NOT DETECTED NOT DETECTED Final   Klebsiella pneumoniae NOT DETECTED NOT DETECTED Final   Proteus species NOT DETECTED NOT DETECTED Final   Salmonella species NOT DETECTED NOT DETECTED Final   Serratia marcescens NOT DETECTED NOT DETECTED Final   Haemophilus influenzae NOT DETECTED NOT DETECTED Final   Neisseria meningitidis NOT DETECTED NOT DETECTED Final   Pseudomonas aeruginosa NOT DETECTED NOT DETECTED Final   Stenotrophomonas maltophilia NOT DETECTED NOT DETECTED Final   Candida albicans NOT DETECTED NOT DETECTED Final   Candida auris NOT DETECTED NOT DETECTED Final   Candida glabrata NOT DETECTED NOT DETECTED Final   Candida krusei NOT DETECTED NOT DETECTED Final   Candida parapsilosis NOT DETECTED NOT DETECTED Final   Candida tropicalis NOT DETECTED NOT DETECTED Final   Cryptococcus neoformans/gattii NOT DETECTED NOT DETECTED Final    Comment: Performed at [facility], [address], [place], [place] [address]  Culture, blood (Routine X 2) w Reflex to ID Panel     Status: None (Preliminary result)   Collection Time: 10/01/23  4:35 AM   Specimen: BLOOD  Result Value Ref Range Status   Specimen Description BLOOD LEFT WRIST  Final   Special Requests   Final    BOTTLES DRAWN AEROBIC AND ANAEROBIC Blood Culture adequate volume   Culture   Final    NO GROWTH 1 DAY Performed at [facility], [address] [place], [place] [address]    Report Status PENDING  Incomplete  Culture, blood (Routine X 2) w Reflex to ID Panel     Status: None (Preliminary result)   Collection Time: 10/01/23  4:35 AM   Specimen: BLOOD  Result Value Ref Range Status   Specimen Description BLOOD LEFT HAND  Final   Special Requests   Final    BOTTLES DRAWN AEROBIC AND ANAEROBIC Blood Culture adequate volume   Culture   Final    NO GROWTH 1 DAY Performed at [facility], [address] [place], [place] [address]    Report Status PENDING  Incomplete         Radiology  Studies: ECHOCARDIOGRAM COMPLETE Result Date: 09/30/2023    ECHOCARDIOGRAM REPORT   Patient Name:   TRONG GOSLING Maceachern Date of Exam: 09/30/2023 Medical Rec #:  [id number]    Height:       65.0 in Accession #:    [id number]   Weight:       112.0 lb Date of Birth:  16-Jul-1941    BSA:          1.546 m Patient Age:    82 years     BP:           96/62 mmHg Patient Gender: M            HR:           67 bpm. Exam Location:  [facility] Procedure: 2D Echo, Cardiac Doppler and Color Doppler Indications:    Stroke I63.9  History:        Patient has prior history of Echocardiogram examinations, most                 recent 11/08/2019. CHF and Cardiomyopathy, CAD, TIA, Stroke and                 CKD, stage 4, Signs/Symptoms:Hypotension; Risk                 Factors:Hypertension, Dyslipidemia and Sleep  Apnea.  Sonographer:    Thea Norlander RCS Referring Phys: [id number] MICHAEL E NORINS IMPRESSIONS  1. Left ventricular ejection fraction, by estimation, is 30 to 35%. The left ventricle has moderately decreased function. The left ventricle demonstrates global hypokinesis. Left ventricular diastolic parameters are consistent with Grade I diastolic dysfunction (impaired relaxation).  2. Right ventricular systolic function is normal. The right ventricular size is normal.  3. The mitral valve is abnormal. Mild mitral valve regurgitation. No evidence of mitral stenosis.  4. The tricuspid valve is abnormal.  5. Moderate aortic stenosis. AVA VTI 1.17, mean gradient 5, DI 0.35, SVI 25. Lower than expected gradient due to low flow low gradient aortic stenosis. . The aortic valve was not well visualized. There is moderate calcification of the aortic valve. There is moderate thickening of the aortic valve. Aortic valve regurgitation is not visualized. Moderate aortic valve stenosis.  6. The inferior vena cava is normal in size with greater than 50% respiratory variability, suggesting right atrial pressure of 3 mmHg. FINDINGS  Left Ventricle: Left  ventricular ejection fraction, by estimation, is 30 to 35%. The left ventricle has moderately decreased function. The left ventricle demonstrates global hypokinesis. The left ventricular internal cavity size was normal in size. There is no left ventricular hypertrophy. Left ventricular diastolic parameters are consistent with Grade I diastolic dysfunction (impaired relaxation). Normal left ventricular filling pressure. Right Ventricle: The right ventricular size is normal. Right vetricular Tieken thickness was not well visualized. Right ventricular systolic function is normal. Left Atrium: Left atrial size was normal in size. Right Atrium: Right atrial size was normal in size. Pericardium: There is no evidence of pericardial effusion. Mitral Valve: The mitral valve is abnormal. Mild mitral valve regurgitation. No evidence of mitral valve stenosis. Tricuspid Valve: The tricuspid valve is abnormal. Tricuspid valve regurgitation is mild . No evidence of tricuspid stenosis. Aortic Valve: Moderate aortic stenosis. AVA VTI 1.17, mean gradient 5, DI 0.35, SVI 25. Lower than expected gradient due to low flow low gradient aortic stenosis. The aortic valve was not well visualized. There is moderate calcification of the aortic valve. There is moderate thickening of the aortic valve. There is moderate aortic valve annular calcification. Aortic valve regurgitation is not visualized. Moderate aortic stenosis is present. Aortic valve mean gradient measures 4.8 mmHg. Aortic valve peak gradient measures 7.5 mmHg. Aortic valve area, by VTI measures 1.17 cm. Pulmonic Valve: The pulmonic valve was not well visualized. Pulmonic valve regurgitation is not visualized. No evidence of pulmonic stenosis. Aorta: The aortic root and ascending aorta are structurally normal, with no evidence of dilitation. Venous: The inferior vena cava is normal in size with greater than 50% respiratory variability, suggesting right atrial pressure of 3 mmHg.  IAS/Shunts: No atrial level shunt detected by color flow Doppler.  LEFT VENTRICLE PLAX 2D LVIDd:         4.70 cm      Diastology LVIDs:         4.10 cm      LV e' medial:    3.68 cm/s LV PW:         0.80 cm      LV E/e' medial:  14.7 LV IVS:        0.80 cm      LV e' lateral:   6.22 cm/s LVOT diam:     2.10 cm      LV E/e' lateral: 8.7 LV SV:         39 LV SV Index:  25 LVOT Area:     3.46 cm  LV Volumes (MOD) LV vol d, MOD A2C: 154.0 ml LV vol d, MOD A4C: 81.6 ml LV vol s, MOD A2C: 109.0 ml LV vol s, MOD A4C: 58.9 ml LV SV MOD A2C:     45.0 ml LV SV MOD A4C:     81.6 ml LV SV MOD BP:      37.8 ml RIGHT VENTRICLE             IVC RV S prime:     10.60 cm/s  IVC diam: 1.60 cm TAPSE (M-mode): 2.5 cm LEFT ATRIUM             Index        RIGHT ATRIUM           Index LA diam:        3.50 cm 2.26 cm/m   RA Area:     11.20 cm LA Vol (A2C):   38.2 ml 24.71 ml/m  RA Volume:   27.50 ml  17.79 ml/m LA Vol (A4C):   37.9 ml 24.51 ml/m LA Biplane Vol: 39.2 ml 25.35 ml/m  AORTIC VALVE AV Area (Vmax):    1.28 cm AV Area (Vmean):   1.11 cm AV Area (VTI):     1.17 cm AV Vmax:           136.70 cm/s AV Vmean:          100.732 cm/s AV VTI:            0.333 m AV Peak Grad:      7.5 mmHg AV Mean Grad:      4.8 mmHg LVOT Vmax:         50.40 cm/s LVOT Vmean:        32.200 cm/s LVOT VTI:          0.113 m LVOT/AV VTI ratio: 0.34  AORTA Ao Root diam: 2.60 cm Ao Asc diam:  3.40 cm MITRAL VALVE               TRICUSPID VALVE MV Area (PHT): 3.12 cm    TR Peak grad:   26.8 mmHg MV Decel Time: 243 msec    TR Vmax:        259.00 cm/s MV E velocity: 54.20 cm/s MV A velocity: 85.90 cm/s  SHUNTS MV E/A ratio:  0.63        Systemic VTI:  0.11 m                            Systemic Diam: 2.10 cm Dorn Ross MD Electronically signed by Dorn Ross MD Signature Date/Time: 09/30/2023/1:11:09 PM    Final         Scheduled Meds:  amLODipine   5 mg Oral QPM   aspirin   325 mg Oral Daily   budesonide  (PULMICORT ) nebulizer solution  0.5 mg  Nebulization BID   clopidogrel   75 mg Oral Daily   diazepam   5 mg Oral BID   heparin   5,000 Units Subcutaneous Q8H   HYDROcodone -acetaminophen   1 tablet Oral BID   ipratropium-albuterol   3 mL Nebulization TID   levothyroxine   75 mcg Oral q AM   methylPREDNISolone  (SOLU-MEDROL ) injection  40 mg Intravenous BID   multivitamin with minerals  1 tablet Oral Daily   pantoprazole   40 mg Oral Daily   potassium chloride  SA  20 mEq Oral QPM   potassium chloride   40 mEq  Oral BID   thiamine   100 mg Oral Daily   Continuous Infusions:  cefTRIAXone  (ROCEPHIN )  IV 2 g (10/01/23 1516)     LOS: 3 days    Time spent: 55 minutes    Kalob Bergen D Maree, DO [facility]  If 7PM-7AM, please contact night-coverage [email or web address] 10/02/2023, 7:29 AM

## 2023-10-02 NOTE — Plan of Care (Signed)
  Problem: Education: Goal: Knowledge of risk factors and measures for prevention of condition will improve Outcome: Progressing   Problem: Coping: Goal: Psychosocial and spiritual needs will be supported Outcome: Progressing   Problem: Respiratory: Goal: Will maintain a patent airway Outcome: Progressing Goal: Complications related to the disease process, condition or treatment will be avoided or minimized Outcome: Progressing   Problem: Education: Goal: Knowledge of General Education information will improve Description: Including pain rating scale, medication(s)/side effects and non-pharmacologic comfort measures Outcome: Progressing   Problem: Health Behavior/Discharge Planning: Goal: Ability to manage health-related needs will improve Outcome: Progressing   Problem: Clinical Measurements: Goal: Will remain free from infection Outcome: Progressing Goal: Diagnostic test results will improve Outcome: Progressing Goal: Respiratory complications will improve Outcome: Progressing Goal: Cardiovascular complication will be avoided Outcome: Progressing   Problem: Activity: Goal: Risk for activity intolerance will decrease Outcome: Progressing   Problem: Nutrition: Goal: Adequate nutrition will be maintained Outcome: Progressing   Problem: Coping: Goal: Level of anxiety will decrease Outcome: Progressing   Problem: Elimination: Goal: Will not experience complications related to bowel motility Outcome: Progressing

## 2023-10-02 NOTE — TOC Progression Note (Signed)
 Transition of Care [facility]) - Progression Note    Patient Details  Name: Jordan Ward MRN: [id number] Date of Birth: Jan 31, 1941  Transition of Care [facility]) CM/SW Contact  [name], [place] Phone Number: 10/02/2023, 10:39 AM  Clinical Narrative:    CSW spoke with pts wife to review bed offers for SNF. CSW explained that pt will have to complete quarantine prior to D/C, he will not be able to D/C to SNF until 1/11. Pts wife states she would like to take a couple days and follow up with CSW on if she and pt prefer home with [facility] or SNF. TOC to follow.   Expected Discharge Plan: Skilled Nursing Facility Barriers to Discharge: Continued Medical Work up  Expected Discharge Plan and Services In-house Referral: Clinical Social Work   Post Acute Care Choice: Skilled Nursing Facility Living arrangements for the past 2 months: Single Family Home                                       Social Determinants of Health (SDOH) Interventions SDOH Screenings   Food Insecurity: No Food Insecurity (09/30/2023)  Housing: Unknown (09/30/2023)  Transportation Needs: No Transportation Needs (09/30/2023)  Utilities: Not At Risk (09/30/2023)  Depression (PHQ2-9): Low Risk  (09/26/2022)  Financial Resource Strain: Low Risk  (09/26/2022)  Social Connections: Socially Isolated (09/30/2023)  Stress: No Stress Concern Present (09/26/2022)  Tobacco Use: Medium Risk (09/29/2023)    Readmission Risk Interventions    08/25/2022    3:58 PM 07/15/2021    9:54 AM 02/15/2021   11:47 AM  Readmission Risk Prevention Plan  Transportation Screening Complete    PCP or Specialist Appt within 5-7 Days   Complete  PCP or Specialist Appt within 3-5 Days  Complete   Home Care Screening Complete  Complete  Medication Review (RN CM) Complete  Complete  HRI or Home Care Consult  Complete   Social Work Consult for Recovery Care Planning/Counseling  Complete   Palliative Care Screening  Complete   Medication  Review Oceanographer)  Complete

## 2023-10-03 DIAGNOSIS — U071 COVID-19: Secondary | ICD-10-CM | POA: Diagnosis not present

## 2023-10-03 DIAGNOSIS — J1282 Pneumonia due to coronavirus disease 2019: Secondary | ICD-10-CM | POA: Diagnosis not present

## 2023-10-03 LAB — GLUCOSE, CAPILLARY: Glucose-Capillary: 146 mg/dL — ABNORMAL HIGH (ref 70–99)

## 2023-10-03 LAB — BASIC METABOLIC PANEL
Anion gap: 9 (ref 5–15)
BUN: 71 mg/dL — ABNORMAL HIGH (ref 8–23)
CO2: 21 mmol/L — ABNORMAL LOW (ref 22–32)
Calcium: 8.4 mg/dL — ABNORMAL LOW (ref 8.9–10.3)
Chloride: 123 mmol/L — ABNORMAL HIGH (ref 98–111)
Creatinine, Ser: 2.31 mg/dL — ABNORMAL HIGH (ref 0.61–1.24)
GFR, Estimated: 28 mL/min — ABNORMAL LOW (ref >60)
Glucose, Bld: 154 mg/dL — ABNORMAL HIGH (ref 70–99)
Potassium: 4 mmol/L (ref 3.5–5.1)
Sodium: 153 mmol/L — ABNORMAL HIGH (ref 135–145)

## 2023-10-03 LAB — CBC
HCT: 36 % — ABNORMAL LOW (ref 39.0–52.0)
Hemoglobin: 11.3 g/dL — ABNORMAL LOW (ref 13.0–17.0)
MCH: 28.6 pg (ref 26.0–34.0)
MCHC: 31.4 g/dL (ref 30.0–36.0)
MCV: 91.1 fL (ref 80.0–100.0)
Platelets: 197 10*3/uL (ref 150–400)
RBC: 3.95 MIL/uL — ABNORMAL LOW (ref 4.22–5.81)
RDW: 16.4 % — ABNORMAL HIGH (ref 11.5–15.5)
WBC: 11.2 10*3/uL — ABNORMAL HIGH (ref 4.0–10.5)
nRBC: 0.2 % (ref 0.0–0.2)

## 2023-10-03 LAB — MAGNESIUM: Magnesium: 2.2 mg/dL (ref 1.7–2.4)

## 2023-10-03 MED ORDER — DEXTROSE 5 % IV SOLN: INTRAVENOUS | Status: AC

## 2023-10-03 MED ORDER — BENZONATATE 100 MG PO CAPS
100.0000 mg | ORAL_CAPSULE | Freq: Three times a day (TID) | ORAL | Status: DC | PRN
  Administered 2023-10-03 – 2023-10-05 (×3): 100 mg via ORAL
  Filled 2023-10-03 (×3): qty 1

## 2023-10-03 MED ORDER — MIRABEGRON ER 25 MG PO TB24
50.0000 mg | ORAL_TABLET | Freq: Every day | ORAL | Status: DC
Start: 2023-10-03 — End: 2023-10-12
  Administered 2023-10-03 – 2023-10-12 (×10): 50 mg via ORAL
  Filled 2023-10-03 (×11): qty 2

## 2023-10-03 MED ORDER — PREDNISONE 20 MG PO TABS
40.0000 mg | ORAL_TABLET | Freq: Every day | ORAL | Status: AC
  Administered 2023-10-03 – 2023-10-07 (×5): 40 mg via ORAL
  Filled 2023-10-03 (×5): qty 2

## 2023-10-03 NOTE — Progress Notes (Signed)
 PROGRESS NOTE    Jordan Ward  FMW:[id number] DOB: 02/08/1941 DOA: 09/29/2023 PCP: Shona Norleen PEDLAR, MD   Brief Narrative:    Jordan Ward, an 83 y/o with multiple co-morbidities including, MS, CKD4, neurogenic bladder with superpubic cystostomy catheter, obstructed left ureter with nephrostomy tube, COPD/Emphysema on home oxygen , hypothyroidism on replacement, dysphagia, non-healing wound right ankle in setting of PAD, HFpEF, cerebral vascular disease. Over the past several days he has been weaker, more short of breath, coughing, left-sided LE weakness, increased slurred speech.  Patient was admitted with COVID-pneumonia as well as significant cerebrovascular disease with small, acute CVA.  He is now noted to have strep pneumonia bacteremia as well.  Assessment & Plan:   Principal Problem:   Pneumonia due to COVID-19 virus Active Problems:   UTI (urinary tract infection)   Cerebrovascular disease   Chronic diastolic (congestive) heart failure (HCC)   CKD (chronic kidney disease) stage 4, GFR 15-29 ml/min (HCC)   Nephrostomy present (HCC)   Emphysema lung (HCC)   Multiple sclerosis (HCC)   Chronic suprapubic catheter (HCC)   Dysphagia   Hypothyroidism   Dilated cardiomyopathy (HCC)   Essential hypertension   COVID-19  Assessment and Plan:   Pneumonia due to COVID-19 virus - remdesivir  infusions has been completed. -Continue treatment with steroids, mucolytic's, flutter valve and bronchodilator management -Will continue supportive care and follow clinical response. -Needs isolation and quarantine through 1/11 until SNF can accept him   Cerebrovascular disease with small ischemic CVA -Complete stroke workup -Follow neurology service recommendation -Prior to admission using aspirin  for secondary prevention -Patient out of the window for any thrombolytic management or intervention. -Will provide treatment with aspirin  and Plavix  for secondary prevention. -Per PT/OT recommendations  patient will need a skilled nursing facility at discharge -LDL 16 -TOC aware and helping with placement.  Hypernatremia -Start D5 fluid, gentle especially given low LVEF -Monitor repeat labs in a.m.   Strep pneumonia bacteremia -Associated with chronic suprapubic catheter -Continue current IV antibiotics with sensitivity noted to Rocephin    Emphysema lung (HCC) -Continue oxygen  supplementation -Continue bronchodilator management -Receiving steroids as part of treatment for COVID -Follow clinical response and continue supportive care.   Nephrostomy present (HCC) -Continue nephrostomy tube care -Continue current antibiotic for UTI -Continue patient follow-up with urology service.   CKD (chronic kidney disease) stage 4, GFR 15-29 ml/min (HCC) -Continue to follow renal function trend -Continue minimizing nephrotoxic agents and avoid the use of contrast and hypotension. -Good urine output currently appreciated.   Chronic diastolic (congestive) heart failure (HCC) -Elevated BNP was present at time of admission -Some decreased breath sounds reported by admitting doctor patient's lung bases. -Provided on 09/30/2023 -Currently appears to be euvolemic; will hold on any further diuresis for the moment. -Follow daily weights and urine output -Low-sodium diet discussed with patient -Gentle IV fluid initiated as noted above   Essential hypertension -Stable for the most part -Continue to follow vital signs.   Dilated cardiomyopathy (HCC) -2D echocardiogram with LVEF 30-35% with global hypokinesis and grade 1 diastolic dysfunction -No significant signs of fluid overload on exam. -Continue to follow daily weights and strict I's and O's.   Hypothyroidism -TSH elevated -Free T4 1.07 -Continue Synthroid .   Dysphagia -Long standing problem which he attributes to MS. Now with evidence of new CVA -Appreciate assistance and recommendation by speech therapy; dysphagia 2 diet with honey  thick liquids recommended while inpatient.   Chronic suprapubic catheter (HCC) -Catheter site appears clean and normal -No erythematous  changes or drainage -Continue current IV antibiotics -Continue local care. -Continue patient follow-up with urology service.   Multiple sclerosis (HCC) -No receiving any medications at the moment -Continue outpatient follow-up with neurology.  DVT prophylaxis:Heparin  Code Status: Full Family Communication: None at bedside Disposition Plan:  Status is: Inpatient Remains inpatient appropriate because: Need for placement and IV antibiotics.  Skin Assessment:  I have examined the patient's skin and I agree with the wound assessment as performed by the wound care RN as outlined below:  Pressure Injury 09/30/23 Ankle Left;Lateral Stage 4 - Full thickness tissue loss with exposed bone, tendon or muscle. 1x1cm open area to outside of right ankle, (Active)  09/30/23 1301  Location: Ankle  Location Orientation: Left;Lateral  Staging: Stage 4 - Full thickness tissue loss with exposed bone, tendon or muscle.  Wound Description (Comments): 1x1cm open area to outside of right ankle,  Present on Admission: Yes  Dressing Type Foam - Lift dressing to assess site every shift 10/02/23 2300     Pressure Injury 09/30/23 Buttocks Right;Left Deep Tissue Pressure Injury - Purple or maroon localized area of discolored intact skin or blood-filled blister due to damage of underlying soft tissue from pressure and/or shear. red nonblanchable area to sac (Active)  09/30/23 1130  Location: Buttocks  Location Orientation: Right;Left  Staging: Deep Tissue Pressure Injury - Purple or maroon localized area of discolored intact skin or blood-filled blister due to damage of underlying soft tissue from pressure and/or shear.  Wound Description (Comments): red nonblanchable area to sacrum and buttocks with purple are in the center, under right buttock a 0.5x0.5 open area  Present on  Admission: Yes  Dressing Type Foam - Lift dressing to assess site every shift 10/02/23 2300    Consultants:  None  Procedures:  None  Antimicrobials:  Anti-infectives (From admission, onward)    Start     Dose/Rate Route Frequency Ordered Stop   10/01/23 1600  cefTRIAXone  (ROCEPHIN ) 2 g in sodium chloride  0.9 % 100 mL IVPB        2 g 200 mL/hr over 30 Minutes Intravenous Every 24 hours 09/30/23 1940     09/30/23 2030  cefTRIAXone  (ROCEPHIN ) 1 g in sodium chloride  0.9 % 100 mL IVPB        1 g 200 mL/hr over 30 Minutes Intravenous  Once 09/30/23 1940 10/01/23 1416   09/30/23 1600  cefTRIAXone  (ROCEPHIN ) 1 g in sodium chloride  0.9 % 100 mL IVPB  Status:  Discontinued        1 g 200 mL/hr over 30 Minutes Intravenous Every 24 hours 09/29/23 2318 09/30/23 1940   09/30/23 1000  remdesivir  100 mg in sodium chloride  0.9 % 100 mL IVPB  Status:  Discontinued       Placed in Followed by Linked Group   100 mg 200 mL/hr over 30 Minutes Intravenous Daily 09/29/23 2317 09/29/23 2318   09/30/23 1000  remdesivir  100 mg in sodium chloride  0.9 % 100 mL IVPB       Placed in Followed by Linked Group   100 mg 200 mL/hr over 30 Minutes Intravenous Daily 09/29/23 2319 10/01/23 0902   09/29/23 2348  remdesivir  100 mg in sodium chloride  0.9 % 100 mL IVPB       Placed in Followed by Linked Group   100 mg 200 mL/hr over 30 Minutes Intravenous  Once 09/29/23 2319 09/30/23 0222   09/29/23 2330  remdesivir  100 mg in sodium chloride  0.9 % 100 mL IVPB  Placed in Followed by Linked Group   100 mg 200 mL/hr over 30 Minutes Intravenous  Once 09/29/23 2319 09/30/23 0118   09/29/23 2315  remdesivir  200 mg in sodium chloride  0.9% 250 mL IVPB  Status:  Discontinued       Placed in Followed by Linked Group   200 mg 580 mL/hr over 30 Minutes Intravenous Once 09/29/23 2317 09/29/23 2318   09/29/23 1515  cefTRIAXone  (ROCEPHIN ) 1 g in sodium chloride  0.9 % 100 mL IVPB        1 g 200 mL/hr over 30  Minutes Intravenous  Once 09/29/23 1506 09/29/23 1627   09/29/23 1515  azithromycin  (ZITHROMAX ) 500 mg in sodium chloride  0.9 % 250 mL IVPB        500 mg 250 mL/hr over 60 Minutes Intravenous  Once 09/29/23 1506 09/29/23 1924      Subjective: Patient seen and evaluated today with no new acute complaints or concerns. No acute concerns or events noted overnight.  Objective: Vitals:   10/03/23 0309 10/03/23 0722 10/03/23 0725 10/03/23 1100  BP: (!) 134/91     Pulse: 80     Resp: 14     Temp: (!) 97.5 F (36.4 C)     TempSrc: Oral     SpO2: 99% 97% 97% 96%  Weight:      Height:        Intake/Output Summary (Last 24 hours) at 10/03/2023 1134 Last data filed at 10/03/2023 0800 Gross per 24 hour  Intake 480 ml  Output 1600 ml  Net -1120 ml   Filed Weights   09/29/23 1331  Weight: 50.8 kg    Examination:  General exam: Appears calm and comfortable  Respiratory system: Clear to auscultation. Respiratory effort normal.  2 L nasal cannula Cardiovascular system: S1 & S2 heard, RRR.  Gastrointestinal system: Abdomen is soft Central nervous system: Alert and awake Extremities: No edema Skin: No significant lesions noted Psychiatry: Flat affect. Catheter is present and C/D/I    Data Reviewed: I have personally reviewed following labs and imaging studies  CBC: Recent Labs  Lab 09/29/23 1355 10/01/23 0435 10/02/23 0339 10/03/23 0402  WBC 6.5 5.8 6.2 11.2*  HGB 11.9* 10.8* 11.0* 11.3*  HCT 36.9* 34.2* 33.9* 36.0*  MCV 92.9 92.2 90.4 91.1  PLT 136* 142* 158 197   Basic Metabolic Panel: Recent Labs  Lab 09/29/23 1355 10/01/23 0435 10/02/23 0339 10/03/23 0402  NA 140 147* 146* 153*  K 3.8 3.5 3.4* 4.0  CL 109 119* 120* 123*  CO2 21* 18* 18* 21*  GLUCOSE 144* 178* 193* 154*  BUN 60* 59* 70* 71*  CREATININE 3.97* 2.51* 2.59* 2.31*  CALCIUM  8.6* 8.2* 8.3* 8.4*  MG  --   --  2.0 2.2   GFR: Estimated Creatinine Clearance: 18.3 mL/min (A) (by C-G formula based on  SCr of 2.31 mg/dL (H)). Liver Function Tests: Recent Labs  Lab 09/29/23 1411 10/01/23 0435  AST 34 28  ALT 19 20  ALKPHOS 69 58  BILITOT 0.7 0.4  PROT 7.4 6.3*  ALBUMIN 3.0* 2.2*   Recent Labs  Lab 09/29/23 1411  LIPASE 21   No results for input(s): AMMONIA in the last 168 hours. Coagulation Profile: No results for input(s): INR, PROTIME in the last 168 hours. Cardiac Enzymes: No results for input(s): CKTOTAL, CKMB, CKMBINDEX, TROPONINI in the last 168 hours. BNP (last 3 results) No results for input(s): PROBNP in the last 8760 hours. HbA1C: No results for input(s): HGBA1C  in the last 72 hours.  CBG: Recent Labs  Lab 09/29/23 1402 10/03/23 0325  GLUCAP 139* 146*   Lipid Profile: No results for input(s): CHOL, HDL, LDLCALC, TRIG, CHOLHDL, LDLDIRECT in the last 72 hours.  Thyroid  Function Tests: Recent Labs    10/02/23 1344  FREET4 1.07   Anemia Panel: Recent Labs    10/01/23 0435  FERRITIN 360*  342*   Sepsis Labs: Recent Labs  Lab 10/01/23 0435  PROCALCITON 6.71    Recent Results (from the past 240 hours)  Resp panel by RT-PCR (RSV, Flu A&B, Covid) Anterior Nasal Swab     Status: Abnormal   Collection Time: 09/29/23 12:13 PM   Specimen: Anterior Nasal Swab  Result Value Ref Range Status   SARS Coronavirus 2 by RT PCR POSITIVE (A) NEGATIVE Final    Comment: (NOTE) SARS-CoV-2 target nucleic acids are DETECTED.  The SARS-CoV-2 RNA is generally detectable in upper respiratory specimens during the acute phase of infection. Positive results are indicative of the presence of the identified virus, but do not rule out bacterial infection or co-infection with other pathogens not detected by the test. Clinical correlation with patient history and other diagnostic information is necessary to determine patient infection status. The expected result is Negative.  Fact Sheet for  Patients: bloggercourse.com  Fact Sheet for Healthcare Providers: seriousbroker.it  This test is not yet approved or cleared by the [place]  FDA and  has been authorized for detection and/or diagnosis of SARS-CoV-2 by FDA under an Emergency Use Authorization (EUA).  This EUA will remain in effect (meaning this test can be used) for the duration of  the COVID-19 declaration under Section 564(b)(1) of the A ct, 21 U.S.C. section 360bbb-3(b)(1), unless the authorization is terminated or revoked sooner.     Influenza A by PCR NEGATIVE NEGATIVE Final   Influenza B by PCR NEGATIVE NEGATIVE Final    Comment: (NOTE) The Xpert Xpress SARS-CoV-2/FLU/RSV plus assay is intended as an aid in the diagnosis of influenza from Nasopharyngeal swab specimens and should not be used as a sole basis for treatment. Nasal washings and aspirates are unacceptable for Xpert Xpress SARS-CoV-2/FLU/RSV testing.  Fact Sheet for Patients: bloggercourse.com  Fact Sheet for Healthcare Providers: seriousbroker.it  This test is not yet approved or cleared by the [place]  FDA and has been authorized for detection and/or diagnosis of SARS-CoV-2 by FDA under an Emergency Use Authorization (EUA). This EUA will remain in effect (meaning this test can be used) for the duration of the COVID-19 declaration under Section 564(b)(1) of the Act, 21 U.S.C. section 360bbb-3(b)(1), unless the authorization is terminated or revoked.     Resp Syncytial Virus by PCR NEGATIVE NEGATIVE Final    Comment: (NOTE) Fact Sheet for Patients: bloggercourse.com  Fact Sheet for Healthcare Providers: seriousbroker.it  This test is not yet approved or cleared by the [place]  FDA and has been authorized for detection and/or diagnosis of SARS-CoV-2 by FDA under an Emergency Use  Authorization (EUA). This EUA will remain in effect (meaning this test can be used) for the duration of the COVID-19 declaration under Section 564(b)(1) of the Act, 21 U.S.C. section 360bbb-3(b)(1), unless the authorization is terminated or revoked.  Performed at [facility], [address], [place], [place] [address]   Culture, blood (Routine x 2)     Status: Abnormal   Collection Time: 09/29/23  2:11 PM   Specimen: BLOOD RIGHT FOREARM  Result Value Ref Range Status   Specimen Description  Final    BLOOD RIGHT FOREARM Performed at [facility], [address], [place], [place] [address]    Special Requests   Final    Blood Culture adequate volume BOTTLES DRAWN AEROBIC AND ANAEROBIC Performed at [facility], [address], [place], [place] [address]    Culture  Setup Time   Final    GRAM POSITIVE COCCI IN BOTH AEROBIC AND ANAEROBIC BOTTLES Gram Stain Report Called to,Read Back By and Verified With: CHARMAINE BREEN [phone], COLLIN JINNY SETTLE STAIN REVIEWED-AGREE WITH RESULT DRT CRITICAL RESULT CALLED TO, READ BACK BY AND VERIFIED WITH: RN JENNIFER HELBERG ON 09/30/23 @ 1909 BY DRT Performed at [facility], [address] [place], [place] [address]    Culture STREPTOCOCCUS PNEUMONIAE (A)  Final   Report Status 10/02/2023 FINAL  Final   Organism ID, Bacteria STREPTOCOCCUS PNEUMONIAE  Final      Susceptibility   Streptococcus pneumoniae - MIC*    ERYTHROMYCIN <=0.12 SENSITIVE Sensitive     LEVOFLOXACIN  0.5 SENSITIVE Sensitive     VANCOMYCIN  0.5 SENSITIVE Sensitive     PENICILLIN (meningitis) <=0.06 SENSITIVE Sensitive     PENO - penicillin <=0.06      PENICILLIN (non-meningitis) <=0.06 SENSITIVE Sensitive     PENICILLIN (oral) <=0.06 SENSITIVE Sensitive     CEFTRIAXONE  (non-meningitis) <=0.12 SENSITIVE Sensitive     CEFTRIAXONE  (meningitis) <=0.12 SENSITIVE Sensitive     * STREPTOCOCCUS PNEUMONIAE  Culture, blood (Routine x 2)     Status: Abnormal   Collection  Time: 09/29/23  2:11 PM   Specimen: BLOOD LEFT HAND  Result Value Ref Range Status   Specimen Description   Final    BLOOD LEFT HAND Performed at [facility], [address], [place], [place] [address]    Special Requests   Final    BOTTLES DRAWN AEROBIC AND ANAEROBIC Blood Culture adequate volume Performed at [facility], [address] [place], [place] [address]    Culture  Setup Time   Final    GRAM POSITIVE COCCI IN BOTH AEROBIC AND ANAEROBIC BOTTLES Gram Stain Report Called to,Read Back By and Verified With: CHARMAINE BREEN [phone], COLLIN JINNY SETTLE STAIN REVIEWED-AGREE WITH RESULT DRT    Culture (A)  Final    STREPTOCOCCUS PNEUMONIAE SUSCEPTIBILITIES PERFORMED ON PREVIOUS CULTURE WITHIN THE LAST 5 DAYS. STAPHYLOCOCCUS CAPITIS THE SIGNIFICANCE OF ISOLATING THIS ORGANISM FROM A SINGLE SET OF BLOOD CULTURES WHEN MULTIPLE SETS ARE DRAWN IS UNCERTAIN. PLEASE NOTIFY THE MICROBIOLOGY DEPARTMENT WITHIN ONE WEEK IF SPECIATION AND SENSITIVITIES ARE REQUIRED. Performed at [facility], [address] [place], [place] [address]    Report Status 10/02/2023 FINAL  Final  Blood Culture ID Panel (Reflexed)     Status: Abnormal   Collection Time: 09/29/23  2:11 PM  Result Value Ref Range Status   Enterococcus faecalis NOT DETECTED NOT DETECTED Final   Enterococcus Faecium NOT DETECTED NOT DETECTED Final   Listeria monocytogenes NOT DETECTED NOT DETECTED Final   Staphylococcus species NOT DETECTED NOT DETECTED Final   Staphylococcus aureus (BCID) NOT DETECTED NOT DETECTED Final   Staphylococcus epidermidis NOT DETECTED NOT DETECTED Final   Staphylococcus lugdunensis NOT DETECTED NOT DETECTED Final   Streptococcus species DETECTED (A) NOT DETECTED Final    Comment: CRITICAL RESULT CALLED TO, READ BACK BY AND VERIFIED WITH: RN JENNIFER HELBERG ON 09/30/23 @ 1909 BY DRT    Streptococcus agalactiae NOT DETECTED NOT DETECTED Final   Streptococcus pneumoniae DETECTED (A) NOT  DETECTED Final    Comment: CRITICAL RESULT CALLED TO, READ BACK BY AND VERIFIED WITH: RN JENNIFER HELBERG ON 09/30/23 @ 1909 BY DRT    Streptococcus pyogenes NOT DETECTED NOT DETECTED Final   A.calcoaceticus-baumannii NOT DETECTED NOT DETECTED Final   Bacteroides fragilis NOT DETECTED NOT DETECTED Final   Enterobacterales NOT DETECTED NOT DETECTED Final   Enterobacter cloacae complex NOT DETECTED NOT DETECTED Final   Escherichia coli NOT DETECTED NOT DETECTED Final   Klebsiella aerogenes NOT DETECTED NOT DETECTED Final   Klebsiella oxytoca NOT DETECTED NOT DETECTED Final   Klebsiella pneumoniae NOT DETECTED NOT DETECTED Final   Proteus species NOT DETECTED NOT DETECTED Final   Salmonella species NOT DETECTED NOT DETECTED Final   Serratia marcescens NOT DETECTED NOT DETECTED Final   Haemophilus influenzae NOT DETECTED NOT DETECTED Final   Neisseria meningitidis NOT DETECTED NOT DETECTED Final   Pseudomonas aeruginosa NOT DETECTED NOT DETECTED Final   Stenotrophomonas maltophilia NOT DETECTED NOT DETECTED Final   Candida albicans NOT DETECTED NOT DETECTED Final   Candida auris NOT DETECTED NOT DETECTED Final   Candida glabrata NOT DETECTED NOT DETECTED Final   Candida krusei NOT DETECTED NOT DETECTED Final   Candida parapsilosis NOT DETECTED NOT DETECTED Final   Candida tropicalis NOT DETECTED NOT DETECTED Final   Cryptococcus neoformans/gattii NOT DETECTED NOT DETECTED Final    Comment: Performed at [facility], [address] [place], [place] [address]  Culture, blood (Routine X 2) w Reflex to ID Panel     Status: None (Preliminary result)   Collection Time: 10/01/23  4:35 AM   Specimen: BLOOD  Result Value Ref Range Status   Specimen Description BLOOD LEFT WRIST  Final   Special Requests   Final    BOTTLES DRAWN AEROBIC AND ANAEROBIC Blood Culture adequate volume   Culture   Final    NO GROWTH 2 DAYS Performed at [facility], [address], [place], [place] [address]     Report Status PENDING  Incomplete  Culture, blood (Routine X 2) w Reflex to ID Panel     Status: None (Preliminary result)   Collection Time: 10/01/23  4:35 AM   Specimen: BLOOD  Result Value Ref Range Status   Specimen Description BLOOD LEFT HAND  Final   Special Requests   Final    BOTTLES DRAWN AEROBIC AND ANAEROBIC Blood Culture adequate volume   Culture   Final    NO GROWTH 2 DAYS Performed at [facility], [address], [place], [place] [address]    Report Status PENDING  Incomplete         Radiology Studies: No results found.       Scheduled Meds:  amLODipine   5 mg Oral QPM   aspirin   325 mg Oral Daily   budesonide  (PULMICORT ) nebulizer solution  0.5 mg Nebulization BID   clopidogrel   75 mg Oral Daily   diazepam   5 mg Oral BID   heparin   5,000 Units Subcutaneous Q8H   HYDROcodone -acetaminophen   1 tablet Oral BID   ipratropium-albuterol   3 mL Nebulization TID   levothyroxine   75 mcg Oral q AM   multivitamin with minerals  1 tablet Oral Daily   pantoprazole   40 mg Oral Daily   potassium chloride  SA  20 mEq Oral QPM   predniSONE   40 mg Oral Q breakfast   thiamine   100 mg Oral Daily   Continuous Infusions:  cefTRIAXone  (ROCEPHIN )  IV 2 g (10/02/23 1722)  dextrose  50 mL/hr at 10/03/23 0821     LOS: 4 days    Time spent: 55 minutes    Jiyaan Steinhauser D Maree, DO [facility]  If 7PM-7AM, please contact night-coverage [email or web address] 10/03/2023, 11:34 AM

## 2023-10-03 NOTE — Plan of Care (Signed)
  Problem: Education: Goal: Knowledge of risk factors and measures for prevention of condition will improve Outcome: Progressing   Problem: Coping: Goal: Psychosocial and spiritual needs will be supported Outcome: Progressing   Problem: Respiratory: Goal: Will maintain a patent airway Outcome: Progressing Goal: Complications related to the disease process, condition or treatment will be avoided or minimized Outcome: Progressing   Problem: Education: Goal: Knowledge of General Education information will improve Description: Including pain rating scale, medication(s)/side effects and non-pharmacologic comfort measures Outcome: Progressing   Problem: Health Behavior/Discharge Planning: Goal: Ability to manage health-related needs will improve Outcome: Progressing   Problem: Clinical Measurements: Goal: Ability to maintain clinical measurements within normal limits will improve Outcome: Progressing Goal: Will remain free from infection Outcome: Progressing Goal: Diagnostic test results will improve Outcome: Progressing Goal: Respiratory complications will improve Outcome: Progressing   Problem: Activity: Goal: Risk for activity intolerance will decrease Outcome: Progressing   Problem: Nutrition: Goal: Adequate nutrition will be maintained Outcome: Progressing   Problem: Coping: Goal: Level of anxiety will decrease Outcome: Progressing   Problem: Elimination: Goal: Will not experience complications related to bowel motility Outcome: Progressing Goal: Will not experience complications related to urinary retention Outcome: Progressing   Problem: Pain Management: Goal: General experience of comfort will improve Outcome: Progressing   Problem: Safety: Goal: Ability to remain free from injury will improve Outcome: Progressing

## 2023-10-04 DIAGNOSIS — J1282 Pneumonia due to coronavirus disease 2019: Secondary | ICD-10-CM | POA: Diagnosis not present

## 2023-10-04 DIAGNOSIS — U071 COVID-19: Secondary | ICD-10-CM | POA: Diagnosis not present

## 2023-10-04 LAB — BASIC METABOLIC PANEL
Anion gap: 8 (ref 5–15)
BUN: 63 mg/dL — ABNORMAL HIGH (ref 8–23)
CO2: 21 mmol/L — ABNORMAL LOW (ref 22–32)
Calcium: 8.1 mg/dL — ABNORMAL LOW (ref 8.9–10.3)
Chloride: 118 mmol/L — ABNORMAL HIGH (ref 98–111)
Creatinine, Ser: 2.04 mg/dL — ABNORMAL HIGH (ref 0.61–1.24)
GFR, Estimated: 32 mL/min — ABNORMAL LOW (ref >60)
Glucose, Bld: 175 mg/dL — ABNORMAL HIGH (ref 70–99)
Potassium: 4.8 mmol/L (ref 3.5–5.1)
Sodium: 147 mmol/L — ABNORMAL HIGH (ref 135–145)

## 2023-10-04 MED ORDER — IPRATROPIUM-ALBUTEROL 0.5-2.5 (3) MG/3ML IN SOLN
3.0000 mL | Freq: Two times a day (BID) | RESPIRATORY_TRACT | Status: DC
  Administered 2023-10-04 – 2023-10-12 (×16): 3 mL via RESPIRATORY_TRACT
  Filled 2023-10-04 (×16): qty 3

## 2023-10-04 NOTE — Plan of Care (Signed)
  Problem: Education: Goal: Knowledge of risk factors and measures for prevention of condition will improve Outcome: Progressing   Problem: Coping: Goal: Psychosocial and spiritual needs will be supported Outcome: Progressing   Problem: Respiratory: Goal: Will maintain a patent airway Outcome: Progressing Goal: Complications related to the disease process, condition or treatment will be avoided or minimized Outcome: Progressing   Problem: Education: Goal: Knowledge of General Education information will improve Description: Including pain rating scale, medication(s)/side effects and non-pharmacologic comfort measures Outcome: Progressing   Problem: Health Behavior/Discharge Planning: Goal: Ability to manage health-related needs will improve Outcome: Progressing   Problem: Clinical Measurements: Goal: Ability to maintain clinical measurements within normal limits will improve Outcome: Progressing Goal: Will remain free from infection Outcome: Progressing Goal: Diagnostic test results will improve Outcome: Progressing Goal: Respiratory complications will improve Outcome: Progressing Goal: Cardiovascular complication will be avoided Outcome: Progressing   Problem: Activity: Goal: Risk for activity intolerance will decrease Outcome: Progressing   Problem: Nutrition: Goal: Adequate nutrition will be maintained Outcome: Progressing   Problem: Coping: Goal: Level of anxiety will decrease Outcome: Progressing   Problem: Elimination: Goal: Will not experience complications related to bowel motility Outcome: Progressing Goal: Will not experience complications related to urinary retention Outcome: Progressing   Problem: Pain Management: Goal: General experience of comfort will improve Outcome: Progressing   Problem: Safety: Goal: Ability to remain free from injury will improve Outcome: Progressing   Problem: Skin Integrity: Goal: Risk for impaired skin integrity will  decrease Outcome: Progressing   Problem: Education: Goal: Knowledge of disease or condition will improve Outcome: Progressing Goal: Knowledge of secondary prevention will improve (MUST DOCUMENT ALL) Outcome: Progressing Goal: Knowledge of patient specific risk factors will improve Alonso N/A or DELETE if not current risk factor) Outcome: Progressing   Problem: Ischemic Stroke/TIA Tissue Perfusion: Goal: Complications of ischemic stroke/TIA will be minimized Outcome: Progressing   Problem: Coping: Goal: Will verbalize positive feelings about self Outcome: Progressing Goal: Will identify appropriate support needs Outcome: Progressing   Problem: Health Behavior/Discharge Planning: Goal: Ability to manage health-related needs will improve Outcome: Progressing Goal: Goals will be collaboratively established with patient/family Outcome: Progressing   Problem: Self-Care: Goal: Ability to participate in self-care as condition permits will improve Outcome: Progressing Goal: Verbalization of feelings and concerns over difficulty with self-care will improve Outcome: Progressing Goal: Ability to communicate needs accurately will improve Outcome: Progressing   Problem: Nutrition: Goal: Risk of aspiration will decrease Outcome: Progressing Goal: Dietary intake will improve Outcome: Progressing

## 2023-10-04 NOTE — Progress Notes (Signed)
 PROGRESS NOTE    Jordan Ward  FMW:[id number] DOB: 1941/04/21 DOA: 09/29/2023 PCP: Shona Norleen PEDLAR, MD   Brief Narrative:    Jordan Ward, an 83 y/o with multiple co-morbidities including, MS, CKD4, neurogenic bladder with superpubic cystostomy catheter, obstructed left ureter with nephrostomy tube, COPD/Emphysema on home oxygen , hypothyroidism on replacement, dysphagia, non-healing wound right ankle in setting of PAD, HFpEF, cerebral vascular disease. Over the past several days he has been weaker, more short of breath, coughing, left-sided LE weakness, increased slurred speech.  Patient was admitted with COVID-pneumonia as well as significant cerebrovascular disease with small, acute CVA.  He is now noted to have strep pneumonia bacteremia as well.  Assessment & Plan:   Principal Problem:   Pneumonia due to COVID-19 virus Active Problems:   UTI (urinary tract infection)   Cerebrovascular disease   Chronic diastolic (congestive) heart failure (HCC)   CKD (chronic kidney disease) stage 4, GFR 15-29 ml/min (HCC)   Nephrostomy present (HCC)   Emphysema lung (HCC)   Multiple sclerosis (HCC)   Chronic suprapubic catheter (HCC)   Dysphagia   Hypothyroidism   Dilated cardiomyopathy (HCC)   Essential hypertension   COVID-19  Assessment and Plan:   Pneumonia due to COVID-19 virus - remdesivir  infusions has been completed. -Continue treatment with steroids, mucolytic's, flutter valve and bronchodilator management -Will continue supportive care and follow clinical response. -Needs isolation and quarantine through 1/11 until SNF can accept him   Cerebrovascular disease with small ischemic CVA -Complete stroke workup -Follow neurology service recommendation -Prior to admission using aspirin  for secondary prevention -Patient out of the window for any thrombolytic management or intervention. -Will provide treatment with aspirin  and Plavix  for secondary prevention. -Per PT/OT recommendations  patient will need a skilled nursing facility at discharge -LDL 16 -TOC aware and helping with placement.  Hypernatremia-improved -Complete D5 fluid and resume as needed -Monitor repeat labs in a.m.   Strep pneumonia bacteremia -Associated with chronic suprapubic catheter -Continue current IV antibiotics with sensitivity noted to Rocephin  day 6/7   Emphysema lung (HCC) -Continue oxygen  supplementation -Continue bronchodilator management -Receiving steroids as part of treatment for COVID -Follow clinical response and continue supportive care.   Nephrostomy present (HCC) -Continue nephrostomy tube care -Continue current antibiotic for UTI -Continue patient follow-up with urology service.   CKD (chronic kidney disease) stage 4, GFR 15-29 ml/min (HCC) -Continue to follow renal function trend -Continue minimizing nephrotoxic agents and avoid the use of contrast and hypotension. -Good urine output currently appreciated.   Chronic diastolic (congestive) heart failure (HCC) -Elevated BNP was present at time of admission -Some decreased breath sounds reported by admitting doctor patient's lung bases. -Provided on 09/30/2023 -Currently appears to be euvolemic; will hold on any further diuresis for the moment. -Follow daily weights and urine output -Low-sodium diet discussed with patient -Gentle IV fluid initiated as noted above   Essential hypertension -Stable for the most part -Continue to follow vital signs.   Dilated cardiomyopathy (HCC) -2D echocardiogram with LVEF 30-35% with global hypokinesis and grade 1 diastolic dysfunction -No significant signs of fluid overload on exam. -Continue to follow daily weights and strict I's and O's.   Hypothyroidism -TSH elevated -Free T4 1.07 -Continue Synthroid .   Dysphagia -Long standing problem which he attributes to MS. Now with evidence of new CVA -Appreciate assistance and recommendation by speech therapy; dysphagia 2 diet with  honey thick liquids recommended while inpatient.   Chronic suprapubic catheter (HCC) -Catheter site appears clean and normal -No  erythematous changes or drainage -Continue current IV antibiotics -Continue local care. -Continue patient follow-up with urology service. -Resumed home Myrbetriq    Multiple sclerosis (HCC) -No receiving any medications at the moment -Continue outpatient follow-up with neurology.  DVT prophylaxis:Heparin  Code Status: Full Family Communication: Discussed with wife at bedside 1/4 Disposition Plan:  Status is: Inpatient Remains inpatient appropriate because: Need for placement and IV antibiotics.  Skin Assessment:  I have examined the patient's skin and I agree with the wound assessment as performed by the wound care RN as outlined below:  Pressure Injury 09/30/23 Ankle Left;Lateral Stage 4 - Full thickness tissue loss with exposed bone, tendon or muscle. 1x1cm open area to outside of right ankle, (Active)  09/30/23 1301  Location: Ankle  Location Orientation: Left;Lateral  Staging: Stage 4 - Full thickness tissue loss with exposed bone, tendon or muscle.  Wound Description (Comments): 1x1cm open area to outside of right ankle,  Present on Admission: Yes  Dressing Type Foam - Lift dressing to assess site every shift 10/02/23 2300     Pressure Injury 09/30/23 Buttocks Right;Left Deep Tissue Pressure Injury - Purple or maroon localized area of discolored intact skin or blood-filled blister due to damage of underlying soft tissue from pressure and/or shear. red nonblanchable area to sac (Active)  09/30/23 1130  Location: Buttocks  Location Orientation: Right;Left  Staging: Deep Tissue Pressure Injury - Purple or maroon localized area of discolored intact skin or blood-filled blister due to damage of underlying soft tissue from pressure and/or shear.  Wound Description (Comments): red nonblanchable area to sacrum and buttocks with purple are in the center,  under right buttock a 0.5x0.5 open area  Present on Admission: Yes  Dressing Type Foam - Lift dressing to assess site every shift 10/02/23 2300    Consultants:  None  Procedures:  None  Antimicrobials:  Anti-infectives (From admission, onward)    Start     Dose/Rate Route Frequency Ordered Stop   10/01/23 1600  cefTRIAXone  (ROCEPHIN ) 2 g in sodium chloride  0.9 % 100 mL IVPB        2 g 200 mL/hr over 30 Minutes Intravenous Every 24 hours 09/30/23 1940     09/30/23 2030  cefTRIAXone  (ROCEPHIN ) 1 g in sodium chloride  0.9 % 100 mL IVPB        1 g 200 mL/hr over 30 Minutes Intravenous  Once 09/30/23 1940 10/01/23 1416   09/30/23 1600  cefTRIAXone  (ROCEPHIN ) 1 g in sodium chloride  0.9 % 100 mL IVPB  Status:  Discontinued        1 g 200 mL/hr over 30 Minutes Intravenous Every 24 hours 09/29/23 2318 09/30/23 1940   09/30/23 1000  remdesivir  100 mg in sodium chloride  0.9 % 100 mL IVPB  Status:  Discontinued       Placed in Followed by Linked Group   100 mg 200 mL/hr over 30 Minutes Intravenous Daily 09/29/23 2317 09/29/23 2318   09/30/23 1000  remdesivir  100 mg in sodium chloride  0.9 % 100 mL IVPB       Placed in Followed by Linked Group   100 mg 200 mL/hr over 30 Minutes Intravenous Daily 09/29/23 2319 10/01/23 0902   09/29/23 2348  remdesivir  100 mg in sodium chloride  0.9 % 100 mL IVPB       Placed in Followed by Linked Group   100 mg 200 mL/hr over 30 Minutes Intravenous  Once 09/29/23 2319 09/30/23 0222   09/29/23 2330  remdesivir  100 mg in sodium chloride   0.9 % 100 mL IVPB       Placed in Followed by Linked Group   100 mg 200 mL/hr over 30 Minutes Intravenous  Once 09/29/23 2319 09/30/23 0118   09/29/23 2315  remdesivir  200 mg in sodium chloride  0.9% 250 mL IVPB  Status:  Discontinued       Placed in Followed by Linked Group   200 mg 580 mL/hr over 30 Minutes Intravenous Once 09/29/23 2317 09/29/23 2318   09/29/23 1515  cefTRIAXone  (ROCEPHIN ) 1 g in sodium chloride   0.9 % 100 mL IVPB        1 g 200 mL/hr over 30 Minutes Intravenous  Once 09/29/23 1506 09/29/23 1627   09/29/23 1515  azithromycin  (ZITHROMAX ) 500 mg in sodium chloride  0.9 % 250 mL IVPB        500 mg 250 mL/hr over 60 Minutes Intravenous  Once 09/29/23 1506 09/29/23 1924      Subjective: Patient seen and evaluated today with no new acute complaints or concerns. No acute concerns or events noted overnight.  Objective: Vitals:   10/03/23 2015 10/04/23 0020 10/04/23 0415 10/04/23 0735  BP: 117/77 116/71 126/82   Pulse: 89 80 100   Resp: 18 18 20    Temp: 97.8 F (36.6 C) 97.9 F (36.6 C) 98.2 F (36.8 C)   TempSrc:  Oral Oral   SpO2: 96% 98% 99% 98%  Weight:      Height:        Intake/Output Summary (Last 24 hours) at 10/04/2023 1015 Last data filed at 10/04/2023 0645 Gross per 24 hour  Intake 2060.73 ml  Output 550 ml  Net 1510.73 ml   Filed Weights   09/29/23 1331  Weight: 50.8 kg    Examination:  General exam: Appears calm and comfortable  Respiratory system: Clear to auscultation. Respiratory effort normal.  2 L nasal cannula Cardiovascular system: S1 & S2 heard, RRR.  Gastrointestinal system: Abdomen is soft Central nervous system: Alert and awake Extremities: No edema Skin: No significant lesions noted Psychiatry: Flat affect. Catheter is present and C/D/I    Data Reviewed: I have personally reviewed following labs and imaging studies  CBC: Recent Labs  Lab 09/29/23 1355 10/01/23 0435 10/02/23 0339 10/03/23 0402  WBC 6.5 5.8 6.2 11.2*  HGB 11.9* 10.8* 11.0* 11.3*  HCT 36.9* 34.2* 33.9* 36.0*  MCV 92.9 92.2 90.4 91.1  PLT 136* 142* 158 197   Basic Metabolic Panel: Recent Labs  Lab 09/29/23 1355 10/01/23 0435 10/02/23 0339 10/03/23 0402 10/04/23 0340  NA 140 147* 146* 153* 147*  K 3.8 3.5 3.4* 4.0 4.8  CL 109 119* 120* 123* 118*  CO2 21* 18* 18* 21* 21*  GLUCOSE 144* 178* 193* 154* 175*  BUN 60* 59* 70* 71* 63*  CREATININE 3.97* 2.51*  2.59* 2.31* 2.04*  CALCIUM  8.6* 8.2* 8.3* 8.4* 8.1*  MG  --   --  2.0 2.2  --    GFR: Estimated Creatinine Clearance: 20.8 mL/min (A) (by C-G formula based on SCr of 2.04 mg/dL (H)). Liver Function Tests: Recent Labs  Lab 09/29/23 1411 10/01/23 0435  AST 34 28  ALT 19 20  ALKPHOS 69 58  BILITOT 0.7 0.4  PROT 7.4 6.3*  ALBUMIN 3.0* 2.2*   Recent Labs  Lab 09/29/23 1411  LIPASE 21   No results for input(s): AMMONIA in the last 168 hours. Coagulation Profile: No results for input(s): INR, PROTIME in the last 168 hours. Cardiac Enzymes: No results for input(s): CKTOTAL,  CKMB, CKMBINDEX, TROPONINI in the last 168 hours. BNP (last 3 results) No results for input(s): PROBNP in the last 8760 hours. HbA1C: No results for input(s): HGBA1C in the last 72 hours.  CBG: Recent Labs  Lab 09/29/23 1402 10/03/23 0325  GLUCAP 139* 146*   Lipid Profile: No results for input(s): CHOL, HDL, LDLCALC, TRIG, CHOLHDL, LDLDIRECT in the last 72 hours.  Thyroid  Function Tests: Recent Labs    10/02/23 1344  FREET4 1.07   Anemia Panel: No results for input(s): VITAMINB12, FOLATE, FERRITIN, TIBC, IRON, RETICCTPCT in the last 72 hours.  Sepsis Labs: Recent Labs  Lab 10/01/23 0435  PROCALCITON 6.71    Recent Results (from the past 240 hours)  Resp panel by RT-PCR (RSV, Flu A&B, Covid) Anterior Nasal Swab     Status: Abnormal   Collection Time: 09/29/23 12:13 PM   Specimen: Anterior Nasal Swab  Result Value Ref Range Status   SARS Coronavirus 2 by RT PCR POSITIVE (A) NEGATIVE Final    Comment: (NOTE) SARS-CoV-2 target nucleic acids are DETECTED.  The SARS-CoV-2 RNA is generally detectable in upper respiratory specimens during the acute phase of infection. Positive results are indicative of the presence of the identified virus, but do not rule out bacterial infection or co-infection with other pathogens not detected by the test. Clinical  correlation with patient history and other diagnostic information is necessary to determine patient infection status. The expected result is Negative.  Fact Sheet for Patients: bloggercourse.com  Fact Sheet for Healthcare Providers: seriousbroker.it  This test is not yet approved or cleared by the [place]  FDA and  has been authorized for detection and/or diagnosis of SARS-CoV-2 by FDA under an Emergency Use Authorization (EUA).  This EUA will remain in effect (meaning this test can be used) for the duration of  the COVID-19 declaration under Section 564(b)(1) of the A ct, 21 U.S.C. section 360bbb-3(b)(1), unless the authorization is terminated or revoked sooner.     Influenza A by PCR NEGATIVE NEGATIVE Final   Influenza B by PCR NEGATIVE NEGATIVE Final    Comment: (NOTE) The Xpert Xpress SARS-CoV-2/FLU/RSV plus assay is intended as an aid in the diagnosis of influenza from Nasopharyngeal swab specimens and should not be used as a sole basis for treatment. Nasal washings and aspirates are unacceptable for Xpert Xpress SARS-CoV-2/FLU/RSV testing.  Fact Sheet for Patients: bloggercourse.com  Fact Sheet for Healthcare Providers: seriousbroker.it  This test is not yet approved or cleared by the [place]  FDA and has been authorized for detection and/or diagnosis of SARS-CoV-2 by FDA under an Emergency Use Authorization (EUA). This EUA will remain in effect (meaning this test can be used) for the duration of the COVID-19 declaration under Section 564(b)(1) of the Act, 21 U.S.C. section 360bbb-3(b)(1), unless the authorization is terminated or revoked.     Resp Syncytial Virus by PCR NEGATIVE NEGATIVE Final    Comment: (NOTE) Fact Sheet for Patients: bloggercourse.com  Fact Sheet for Healthcare  Providers: seriousbroker.it  This test is not yet approved or cleared by the [place]  FDA and has been authorized for detection and/or diagnosis of SARS-CoV-2 by FDA under an Emergency Use Authorization (EUA). This EUA will remain in effect (meaning this test can be used) for the duration of the COVID-19 declaration under Section 564(b)(1) of the Act, 21 U.S.C. section 360bbb-3(b)(1), unless the authorization is terminated or revoked.  Performed at [facility], [address] [place], [place] [address]   Culture, blood (Routine x 2)  Status: Abnormal   Collection Time: 09/29/23  2:11 PM   Specimen: BLOOD RIGHT FOREARM  Result Value Ref Range Status   Specimen Description   Final    BLOOD RIGHT FOREARM Performed at [facility], [address], [place], [place] [address]    Special Requests   Final    Blood Culture adequate volume BOTTLES DRAWN AEROBIC AND ANAEROBIC Performed at [facility], [address] [place], [place] [address]    Culture  Setup Time   Final    GRAM POSITIVE COCCI IN BOTH AEROBIC AND ANAEROBIC BOTTLES Gram Stain Report Called to,Read Back By and Verified With: CHARMAINE BREEN [phone], COLLIN JINNY SETTLE STAIN REVIEWED-AGREE WITH RESULT DRT CRITICAL RESULT CALLED TO, READ BACK BY AND VERIFIED WITH: RN JENNIFER HELBERG ON 09/30/23 @ 1909 BY DRT Performed at [facility], [address], [place], [place] [address]    Culture STREPTOCOCCUS PNEUMONIAE (A)  Final   Report Status 10/02/2023 FINAL  Final   Organism ID, Bacteria STREPTOCOCCUS PNEUMONIAE  Final      Susceptibility   Streptococcus pneumoniae - MIC*    ERYTHROMYCIN <=0.12 SENSITIVE Sensitive     LEVOFLOXACIN  0.5 SENSITIVE Sensitive     VANCOMYCIN  0.5 SENSITIVE Sensitive     PENICILLIN (meningitis) <=0.06 SENSITIVE Sensitive     PENO - penicillin <=0.06      PENICILLIN (non-meningitis) <=0.06 SENSITIVE Sensitive     PENICILLIN (oral) <=0.06 SENSITIVE  Sensitive     CEFTRIAXONE  (non-meningitis) <=0.12 SENSITIVE Sensitive     CEFTRIAXONE  (meningitis) <=0.12 SENSITIVE Sensitive     * STREPTOCOCCUS PNEUMONIAE  Culture, blood (Routine x 2)     Status: Abnormal   Collection Time: 09/29/23  2:11 PM   Specimen: BLOOD LEFT HAND  Result Value Ref Range Status   Specimen Description   Final    BLOOD LEFT HAND Performed at [facility], [address], [place], [place] [address]    Special Requests   Final    BOTTLES DRAWN AEROBIC AND ANAEROBIC Blood Culture adequate volume Performed at [facility], [address], [place], [place] [address]    Culture  Setup Time   Final    GRAM POSITIVE COCCI IN BOTH AEROBIC AND ANAEROBIC BOTTLES Gram Stain Report Called to,Read Back By and Verified With: CHARMAINE BREEN [phone], COLLIN JINNY SETTLE STAIN REVIEWED-AGREE WITH RESULT DRT    Culture (A)  Final    STREPTOCOCCUS PNEUMONIAE SUSCEPTIBILITIES PERFORMED ON PREVIOUS CULTURE WITHIN THE LAST 5 DAYS. STAPHYLOCOCCUS CAPITIS THE SIGNIFICANCE OF ISOLATING THIS ORGANISM FROM A SINGLE SET OF BLOOD CULTURES WHEN MULTIPLE SETS ARE DRAWN IS UNCERTAIN. PLEASE NOTIFY THE MICROBIOLOGY DEPARTMENT WITHIN ONE WEEK IF SPECIATION AND SENSITIVITIES ARE REQUIRED. Performed at [facility], [address], [place], [place] [address]    Report Status 10/02/2023 FINAL  Final  Blood Culture ID Panel (Reflexed)     Status: Abnormal   Collection Time: 09/29/23  2:11 PM  Result Value Ref Range Status   Enterococcus faecalis NOT DETECTED NOT DETECTED Final   Enterococcus Faecium NOT DETECTED NOT DETECTED Final   Listeria monocytogenes NOT DETECTED NOT DETECTED Final   Staphylococcus species NOT DETECTED NOT DETECTED Final   Staphylococcus aureus (BCID) NOT DETECTED NOT DETECTED Final   Staphylococcus epidermidis NOT DETECTED NOT DETECTED Final   Staphylococcus lugdunensis NOT DETECTED NOT DETECTED Final   Streptococcus species DETECTED (A) NOT DETECTED Final     Comment: CRITICAL RESULT CALLED TO, READ BACK BY  AND VERIFIED WITH: RN JENNIFER HELBERG ON 09/30/23 @ 1909 BY DRT    Streptococcus agalactiae NOT DETECTED NOT DETECTED Final   Streptococcus pneumoniae DETECTED (A) NOT DETECTED Final    Comment: CRITICAL RESULT CALLED TO, READ BACK BY AND VERIFIED WITH: RN JENNIFER HELBERG ON 09/30/23 @ 1909 BY DRT    Streptococcus pyogenes NOT DETECTED NOT DETECTED Final   A.calcoaceticus-baumannii NOT DETECTED NOT DETECTED Final   Bacteroides fragilis NOT DETECTED NOT DETECTED Final   Enterobacterales NOT DETECTED NOT DETECTED Final   Enterobacter cloacae complex NOT DETECTED NOT DETECTED Final   Escherichia coli NOT DETECTED NOT DETECTED Final   Klebsiella aerogenes NOT DETECTED NOT DETECTED Final   Klebsiella oxytoca NOT DETECTED NOT DETECTED Final   Klebsiella pneumoniae NOT DETECTED NOT DETECTED Final   Proteus species NOT DETECTED NOT DETECTED Final   Salmonella species NOT DETECTED NOT DETECTED Final   Serratia marcescens NOT DETECTED NOT DETECTED Final   Haemophilus influenzae NOT DETECTED NOT DETECTED Final   Neisseria meningitidis NOT DETECTED NOT DETECTED Final   Pseudomonas aeruginosa NOT DETECTED NOT DETECTED Final   Stenotrophomonas maltophilia NOT DETECTED NOT DETECTED Final   Candida albicans NOT DETECTED NOT DETECTED Final   Candida auris NOT DETECTED NOT DETECTED Final   Candida glabrata NOT DETECTED NOT DETECTED Final   Candida krusei NOT DETECTED NOT DETECTED Final   Candida parapsilosis NOT DETECTED NOT DETECTED Final   Candida tropicalis NOT DETECTED NOT DETECTED Final   Cryptococcus neoformans/gattii NOT DETECTED NOT DETECTED Final    Comment: Performed at [facility], [address], [place], [place] [address]  Culture, blood (Routine X 2) w Reflex to ID Panel     Status: None (Preliminary result)   Collection Time: 10/01/23  4:35 AM   Specimen: BLOOD  Result Value Ref Range Status   Specimen Description BLOOD LEFT WRIST   Final   Special Requests   Final    BOTTLES DRAWN AEROBIC AND ANAEROBIC Blood Culture adequate volume   Culture   Final    NO GROWTH 3 DAYS Performed at [facility], [address] [place], [place] [address]    Report Status PENDING  Incomplete  Culture, blood (Routine X 2) w Reflex to ID Panel     Status: None (Preliminary result)   Collection Time: 10/01/23  4:35 AM   Specimen: BLOOD  Result Value Ref Range Status   Specimen Description BLOOD LEFT HAND  Final   Special Requests   Final    BOTTLES DRAWN AEROBIC AND ANAEROBIC Blood Culture adequate volume   Culture   Final    NO GROWTH 3 DAYS Performed at [facility], [address], [place], [place] [address]    Report Status PENDING  Incomplete         Radiology Studies: No results found.       Scheduled Meds:  amLODipine   5 mg Oral QPM   aspirin   325 mg Oral Daily   budesonide  (PULMICORT ) nebulizer solution  0.5 mg Nebulization BID   clopidogrel   75 mg Oral Daily   diazepam   5 mg Oral BID   heparin   5,000 Units Subcutaneous Q8H   HYDROcodone -acetaminophen   1 tablet Oral BID   ipratropium-albuterol   3 mL Nebulization BID   levothyroxine   75 mcg Oral q AM   mirabegron  ER  50 mg Oral Daily   multivitamin with minerals  1 tablet Oral Daily   pantoprazole   40 mg Oral Daily   potassium chloride   SA  20 mEq Oral QPM   predniSONE   40 mg Oral Q breakfast   thiamine   100 mg Oral Daily   Continuous Infusions:  cefTRIAXone  (ROCEPHIN )  IV 2 g (10/03/23 1704)     LOS: 5 days    Time spent: 35 minutes    Alitzel Cookson JONETTA Fairly, DO [facility]  If 7PM-7AM, please contact night-coverage [email or web address] 10/04/2023, 10:15 AM

## 2023-10-05 DIAGNOSIS — R7881 Bacteremia: Secondary | ICD-10-CM

## 2023-10-05 DIAGNOSIS — E039 Hypothyroidism, unspecified: Secondary | ICD-10-CM | POA: Diagnosis not present

## 2023-10-05 DIAGNOSIS — G35 Multiple sclerosis: Secondary | ICD-10-CM | POA: Diagnosis not present

## 2023-10-05 DIAGNOSIS — J439 Emphysema, unspecified: Secondary | ICD-10-CM

## 2023-10-05 DIAGNOSIS — I639 Cerebral infarction, unspecified: Secondary | ICD-10-CM

## 2023-10-05 DIAGNOSIS — U071 COVID-19: Secondary | ICD-10-CM | POA: Diagnosis not present

## 2023-10-05 DIAGNOSIS — Z936 Other artificial openings of urinary tract status: Secondary | ICD-10-CM | POA: Diagnosis not present

## 2023-10-05 LAB — CBC
HCT: 34.5 % — ABNORMAL LOW (ref 39.0–52.0)
Hemoglobin: 11.2 g/dL — ABNORMAL LOW (ref 13.0–17.0)
MCH: 29.9 pg (ref 26.0–34.0)
MCHC: 32.5 g/dL (ref 30.0–36.0)
MCV: 92.2 fL (ref 80.0–100.0)
Platelets: 220 10*3/uL (ref 150–400)
RBC: 3.74 MIL/uL — ABNORMAL LOW (ref 4.22–5.81)
RDW: 16.7 % — ABNORMAL HIGH (ref 11.5–15.5)
WBC: 19.2 10*3/uL — ABNORMAL HIGH (ref 4.0–10.5)
nRBC: 0.1 % (ref 0.0–0.2)

## 2023-10-05 LAB — BASIC METABOLIC PANEL
Anion gap: 8 (ref 5–15)
BUN: 69 mg/dL — ABNORMAL HIGH (ref 8–23)
CO2: 17 mmol/L — ABNORMAL LOW (ref 22–32)
Calcium: 8.1 mg/dL — ABNORMAL LOW (ref 8.9–10.3)
Chloride: 118 mmol/L — ABNORMAL HIGH (ref 98–111)
Creatinine, Ser: 2.07 mg/dL — ABNORMAL HIGH (ref 0.61–1.24)
GFR, Estimated: 32 mL/min — ABNORMAL LOW (ref >60)
Glucose, Bld: 153 mg/dL — ABNORMAL HIGH (ref 70–99)
Potassium: 5.1 mmol/L (ref 3.5–5.1)
Sodium: 143 mmol/L (ref 135–145)

## 2023-10-05 LAB — MAGNESIUM: Magnesium: 2.2 mg/dL (ref 1.7–2.4)

## 2023-10-05 NOTE — Progress Notes (Signed)
 Physical Therapy Treatment Patient Details Name: Ward Ward MRN: [id number] DOB: March 24, 1941 Today's Date: 10/05/2023   History of Present Illness Ward Ward, an 83 y/o with multiple co-morbidities including, MS, CKD4, neurogenic bladder with superpubic cystostomy catheter, obstructed left ureter with nephrostomy tube, COPD/Emphysema on home oxygen , hypothyroidism on replacement, dysphagia, non-healing wound right ankle in setting of PAD, HFpEF, cerebral vascular disease. Over the past several days he has been weaker, more short of breath, coughing, left-sided LE weakness, increased slurred speech. He denies having fever, N/V, diarrhea other than his chronic loose stools. He was here to fore able to manage many of his ADLs including bathing, eating, taking his medications. Due to his increased weakness, slurred speech, shortness of breath EMS was activated and he was brought to [facility] for evaluation.    PT Comments  Pt agreeable to therapy.  Upon entrance pt breathing at 95-97% on room air with descreased O2 saturation with exertion dropping to 88-89% following bed mobility.  Resumed 2L O2 as pt reports he uses oxygen  at home.  Min-mod A with transfer training and ability to ambulate with RW making sidesteps front of chair.  Pt limited by weakness and fatigue, required seated rest breaks 3 times to make front EOB to chair ~64feet.  O2 saturation WNL with 2L O2A.  EOS pt left in chair with call bell within reach.    If plan is discharge home, recommend the following:     Can travel by private vehicle        Equipment Recommendations       Recommendations for Other Services       Precautions / Restrictions Precautions Precautions: Fall Restrictions Weight Bearing Restrictions Per Provider Order: No     Mobility  Bed Mobility Overal bed mobility: Independent Bed Mobility: Supine to Sit     Supine to sit: Supervision, Modified independent (Device/Increase time)     General bed mobility  comments: slow labored movements    Transfers Overall transfer level: Needs assistance Equipment used: Rolling walker (2 wheels) Transfers: Sit to/from Stand Sit to Stand: Min assist, Mod assist           General transfer comment: took extra time; fatigues quickly with exertion; stands with trunk and head flexed    Ambulation/Gait Ambulation/Gait assistance: Mod assist Gait Distance (Feet): 4 Feet Assistive device: Rolling walker (2 wheels)   Gait velocity: decreased     General Gait Details: 3 attempts side stepping front of bed, required sits due to LE weakness   Stairs             Wheelchair Mobility     Tilt Bed    Modified Rankin (Stroke Patients Only)       Balance                                            Cognition Arousal: Alert Behavior During Therapy: WFL for tasks assessed/performed Overall Cognitive Status: No family/caregiver present to determine baseline cognitive functioning                                          Exercises General Exercises - Lower Extremity Ankle Circles/Pumps: AROM, Both, 5 reps, Seated Long Arc Quad: AROM, Strengthening, Both, 5 reps    General Comments  Pertinent Vitals/Pain Pain Assessment Pain Assessment: No/denies pain    Home Living                          Prior Function            PT Goals (current goals can now be found in the care plan section)      Frequency           PT Plan      Co-evaluation              AM-PAC PT 6 Clicks Mobility   Outcome Measure  Help needed turning from your back to your side while in a flat bed without using bedrails?: A Little Help needed moving from lying on your back to sitting on the side of a flat bed without using bedrails?: A Little Help needed moving to and from a bed to a chair (including a wheelchair)?: A Lot Help needed standing up from a chair using your arms (e.g., wheelchair  or bedside chair)?: A Lot Help needed to walk in hospital room?: A Lot Help needed climbing 3-5 steps with a railing? : Total 6 Click Score: 13    End of Session Equipment Utilized During Treatment: Oxygen  Activity Tolerance: Patient limited by fatigue Patient left: in chair;with call bell/phone within reach         Time: 1152-1223 PT Time Calculation (min) (ACUTE ONLY): 31 min  Charges:    $Therapeutic Activity: 23-37 mins PT General Charges $$ ACUTE PT VISIT: 1 Visit                     Ward Ward, LPTA/CLT; CBIS [phone]  Ward Ward 10/05/2023, 12:32 PM

## 2023-10-05 NOTE — Progress Notes (Signed)
 PROGRESS NOTE    Jordan Ward  FMW:[id number] DOB: 1941/07/26 DOA: 09/29/2023 PCP: Shona Norleen PEDLAR, MD   Brief Narrative:    Jordan Ward, an 83 y/o with multiple co-morbidities including, MS, CKD4, neurogenic bladder with superpubic cystostomy catheter, obstructed left ureter with nephrostomy tube, COPD/Emphysema on home oxygen , hypothyroidism on replacement, dysphagia, non-healing wound right ankle in setting of PAD, HFpEF, cerebral vascular disease. Over the past several days he has been weaker, more short of breath, coughing, left-sided LE weakness, increased slurred speech.  Patient was admitted with COVID-pneumonia as well as significant cerebrovascular disease with small, acute CVA.  He is now noted to have strep pneumonia bacteremia as well.  Assessment & Plan:   Principal Problem:   Pneumonia due to COVID-19 virus Active Problems:   UTI (urinary tract infection)   Cerebrovascular disease   Chronic diastolic (congestive) heart failure (HCC)   CKD (chronic kidney disease) stage 4, GFR 15-29 ml/min (HCC)   Nephrostomy present (HCC)   Emphysema lung (HCC)   Multiple sclerosis (HCC)   Chronic suprapubic catheter (HCC)   Dysphagia   Hypothyroidism   Dilated cardiomyopathy (HCC)   Essential hypertension   COVID-19  Assessment and Plan:   Pneumonia due to COVID-19 virus - remdesivir  infusions has been completed. -Continue treatment with steroids, mucolytic's, flutter valve and bronchodilator management -Will continue supportive care and follow clinical response. -Needs isolation and quarantine through 1/11 until SNF can accept him. -TOC on more and assisting with placement.   Cerebrovascular disease with small ischemic CVA -Complete stroke workup -Follow neurology service recommendation -Prior to admission using aspirin  for secondary prevention -Patient out of the window for any thrombolytic management or intervention. -Will provide treatment with aspirin  and Plavix  for  secondary prevention. -Per PT/OT recommendations patient will need a skilled nursing facility at discharge -LDL 16 -TOC aware and helping with placement.  Hypernatremia-improved -Complete D5 fluid and resume as needed -Monitor repeat labs in a.m.   Strep pneumonia bacteremia -Associated with chronic suprapubic catheter -Continue current IV antibiotics with sensitivity noted to Rocephin  day 6/10   Emphysema lung (HCC) -Continue oxygen  supplementation -Continue bronchodilator management -Receiving steroids as part of treatment for COVID -Follow clinical response and continue supportive care.   Nephrostomy present (HCC) -Continue nephrostomy tube care -Continue current antibiotic for UTI -Continue patient follow-up with urology service.   CKD (chronic kidney disease) stage 4, GFR 15-29 ml/min (HCC) -Continue to follow renal function trend -Continue minimizing nephrotoxic agents and avoid the use of contrast and hypotension. -Good urine output currently appreciated.   Chronic diastolic (congestive) heart failure (HCC) -Elevated BNP was present at time of admission -Some decreased breath sounds reported by admitting doctor patient's lung bases. -Provided on 09/30/2023 -Currently appears to be euvolemic; will hold on any further diuresis for the moment. -Follow daily weights and urine output -Low-sodium diet discussed with patient -Gentle IV fluid initiated as noted above   Essential hypertension -Stable for the most part -Continue to follow vital signs.   Dilated cardiomyopathy (HCC) -2D echocardiogram with LVEF 30-35% with global hypokinesis and grade 1 diastolic dysfunction -No significant signs of fluid overload on exam. -Continue to follow daily weights and strict I's and O's.   Hypothyroidism -TSH elevated -Free T4 1.07 -Continue Synthroid .   Dysphagia -Long standing problem which he attributes to MS. Now with evidence of new CVA -Appreciate assistance and  recommendation by speech therapy; dysphagia 2 diet with honey thick liquids recommended while inpatient.   Chronic suprapubic catheter (HCC) -  Catheter site appears clean and normal -No erythematous changes or drainage -Continue current IV antibiotics -Continue local care. -Continue patient follow-up with urology service. -Resumed home Myrbetriq    Multiple sclerosis (HCC) -No receiving any medications at the moment -Continue outpatient follow-up with neurology.  DVT prophylaxis:Heparin  Code Status: Full Family Communication: Discussed with wife at bedside 1/4 Disposition Plan:  Status is: Inpatient Remains inpatient appropriate because: Need for placement and IV antibiotics.  Skin Assessment:  I have examined the patient's skin and I agree with the wound assessment as performed by the wound care RN as outlined below:  Pressure Injury 09/30/23 Ankle Left;Lateral Stage 4 - Full thickness tissue loss with exposed bone, tendon or muscle. 1x1cm open area to outside of right ankle, (Active)  09/30/23 1301  Location: Ankle  Location Orientation: Left;Lateral  Staging: Stage 4 - Full thickness tissue loss with exposed bone, tendon or muscle.  Wound Description (Comments): 1x1cm open area to outside of right ankle,  Present on Admission: Yes  Dressing Type Foam - Lift dressing to assess site every shift 10/02/23 2300     Pressure Injury 09/30/23 Buttocks Right;Left Deep Tissue Pressure Injury - Purple or maroon localized area of discolored intact skin or blood-filled blister due to damage of underlying soft tissue from pressure and/or shear. red nonblanchable area to sac (Active)  09/30/23 1130  Location: Buttocks  Location Orientation: Right;Left  Staging: Deep Tissue Pressure Injury - Purple or maroon localized area of discolored intact skin or blood-filled blister due to damage of underlying soft tissue from pressure and/or shear.  Wound Description (Comments): red nonblanchable area  to sacrum and buttocks with purple are in the center, under right buttock a 0.5x0.5 open area  Present on Admission: Yes  Dressing Type Foam - Lift dressing to assess site every shift 10/02/23 2300    Consultants:  None  Procedures:  None  Antimicrobials:  Anti-infectives (From admission, onward)    Start     Dose/Rate Route Frequency Ordered Stop   10/01/23 1600  cefTRIAXone  (ROCEPHIN ) 2 g in sodium chloride  0.9 % 100 mL IVPB        2 g 200 mL/hr over 30 Minutes Intravenous Every 24 hours 09/30/23 1940     09/30/23 2030  cefTRIAXone  (ROCEPHIN ) 1 g in sodium chloride  0.9 % 100 mL IVPB        1 g 200 mL/hr over 30 Minutes Intravenous  Once 09/30/23 1940 10/01/23 1416   09/30/23 1600  cefTRIAXone  (ROCEPHIN ) 1 g in sodium chloride  0.9 % 100 mL IVPB  Status:  Discontinued        1 g 200 mL/hr over 30 Minutes Intravenous Every 24 hours 09/29/23 2318 09/30/23 1940   09/30/23 1000  remdesivir  100 mg in sodium chloride  0.9 % 100 mL IVPB  Status:  Discontinued       Placed in Followed by Linked Group   100 mg 200 mL/hr over 30 Minutes Intravenous Daily 09/29/23 2317 09/29/23 2318   09/30/23 1000  remdesivir  100 mg in sodium chloride  0.9 % 100 mL IVPB       Placed in Followed by Linked Group   100 mg 200 mL/hr over 30 Minutes Intravenous Daily 09/29/23 2319 10/01/23 0902   09/29/23 2348  remdesivir  100 mg in sodium chloride  0.9 % 100 mL IVPB       Placed in Followed by Linked Group   100 mg 200 mL/hr over 30 Minutes Intravenous  Once 09/29/23 2319 09/30/23 0222   09/29/23 2330  remdesivir  100 mg in sodium chloride  0.9 % 100 mL IVPB       Placed in Followed by Linked Group   100 mg 200 mL/hr over 30 Minutes Intravenous  Once 09/29/23 2319 09/30/23 0118   09/29/23 2315  remdesivir  200 mg in sodium chloride  0.9% 250 mL IVPB  Status:  Discontinued       Placed in Followed by Linked Group   200 mg 580 mL/hr over 30 Minutes Intravenous Once 09/29/23 2317 09/29/23 2318   09/29/23  1515  cefTRIAXone  (ROCEPHIN ) 1 g in sodium chloride  0.9 % 100 mL IVPB        1 g 200 mL/hr over 30 Minutes Intravenous  Once 09/29/23 1506 09/29/23 1627   09/29/23 1515  azithromycin  (ZITHROMAX ) 500 mg in sodium chloride  0.9 % 250 mL IVPB        500 mg 250 mL/hr over 60 Minutes Intravenous  Once 09/29/23 1506 09/29/23 1924      Subjective: No overnight events; no chest pain, no nausea or vomiting.  2 L at in place with good saturation.  Chronically ill in appearance and is present to be weak/deconditioned.  Objective: Vitals:   10/05/23 0804 10/05/23 0806 10/05/23 1227 10/05/23 1231  BP:    (!) 99/53  Pulse:    86  Resp:    18  Temp:    97.6 F (36.4 C)  TempSrc:      SpO2: 96% 98% 96% 98%  Weight:      Height:        Intake/Output Summary (Last 24 hours) at 10/05/2023 1812 Last data filed at 10/05/2023 1428 Gross per 24 hour  Intake 360 ml  Output 1225 ml  Net -865 ml   Filed Weights   09/29/23 1331  Weight: 50.8 kg    Examination: General exam: Alert, awake, oriented x 3; calm and comfortable.  No fever. Respiratory system: Positive scattered rhonchi; no using accessory muscle.  2 L of currently placed. Cardiovascular system:RRR. No rubs or gallops; no JVD. Gastrointestinal system: Abdomen is nondistended, soft and nontender.  Positive bowel sounds.  Nephrostomy tube and suprapubic catheter in place.  Surrounding area clean and dry. Central nervous system: No new focal neurological deficits. Extremities: No cyanosis or clubbing. Skin: No petechiae. Psychiatry: Judgement and insight appear normal.  Flat affect appreciated on exam.  Data Reviewed: I have personally reviewed following labs and imaging studies  CBC: Recent Labs  Lab 09/29/23 1355 10/01/23 0435 10/02/23 0339 10/03/23 0402 10/05/23 0458  WBC 6.5 5.8 6.2 11.2* 19.2*  HGB 11.9* 10.8* 11.0* 11.3* 11.2*  HCT 36.9* 34.2* 33.9* 36.0* 34.5*  MCV 92.9 92.2 90.4 91.1 92.2  PLT 136* 142* 158 197 220    Basic Metabolic Panel: Recent Labs  Lab 10/01/23 0435 10/02/23 0339 10/03/23 0402 10/04/23 0340 10/05/23 0458  NA 147* 146* 153* 147* 143  K 3.5 3.4* 4.0 4.8 5.1  CL 119* 120* 123* 118* 118*  CO2 18* 18* 21* 21* 17*  GLUCOSE 178* 193* 154* 175* 153*  BUN 59* 70* 71* 63* 69*  CREATININE 2.51* 2.59* 2.31* 2.04* 2.07*  CALCIUM  8.2* 8.3* 8.4* 8.1* 8.1*  MG  --  2.0 2.2  --  2.2   GFR: Estimated Creatinine Clearance: 20.5 mL/min (A) (by C-G formula based on SCr of 2.07 mg/dL (H)).  Liver Function Tests: Recent Labs  Lab 09/29/23 1411 10/01/23 0435  AST 34 28  ALT 19 20  ALKPHOS 69 58  BILITOT 0.7 0.4  PROT 7.4 6.3*  ALBUMIN 3.0* 2.2*   Recent Labs  Lab 09/29/23 1411  LIPASE 21   CBG: Recent Labs  Lab 09/29/23 1402 10/03/23 0325  GLUCAP 139* 146*   Sepsis Labs: Recent Labs  Lab 10/01/23 0435  PROCALCITON 6.71    Recent Results (from the past 240 hours)  Resp panel by RT-PCR (RSV, Flu A&B, Covid) Anterior Nasal Swab     Status: Abnormal   Collection Time: 09/29/23 12:13 PM   Specimen: Anterior Nasal Swab  Result Value Ref Range Status   SARS Coronavirus 2 by RT PCR POSITIVE (A) NEGATIVE Final    Comment: (NOTE) SARS-CoV-2 target nucleic acids are DETECTED.  The SARS-CoV-2 RNA is generally detectable in upper respiratory specimens during the acute phase of infection. Positive results are indicative of the presence of the identified virus, but do not rule out bacterial infection or co-infection with other pathogens not detected by the test. Clinical correlation with patient history and other diagnostic information is necessary to determine patient infection status. The expected result is Negative.  Fact Sheet for Patients: bloggercourse.com  Fact Sheet for Healthcare Providers: seriousbroker.it  This test is not yet approved or cleared by the [place]  FDA and  has been authorized for detection  and/or diagnosis of SARS-CoV-2 by FDA under an Emergency Use Authorization (EUA).  This EUA will remain in effect (meaning this test can be used) for the duration of  the COVID-19 declaration under Section 564(b)(1) of the A ct, 21 U.S.C. section 360bbb-3(b)(1), unless the authorization is terminated or revoked sooner.     Influenza A by PCR NEGATIVE NEGATIVE Final   Influenza B by PCR NEGATIVE NEGATIVE Final    Comment: (NOTE) The Xpert Xpress SARS-CoV-2/FLU/RSV plus assay is intended as an aid in the diagnosis of influenza from Nasopharyngeal swab specimens and should not be used as a sole basis for treatment. Nasal washings and aspirates are unacceptable for Xpert Xpress SARS-CoV-2/FLU/RSV testing.  Fact Sheet for Patients: bloggercourse.com  Fact Sheet for Healthcare Providers: seriousbroker.it  This test is not yet approved or cleared by the [place]  FDA and has been authorized for detection and/or diagnosis of SARS-CoV-2 by FDA under an Emergency Use Authorization (EUA). This EUA will remain in effect (meaning this test can be used) for the duration of the COVID-19 declaration under Section 564(b)(1) of the Act, 21 U.S.C. section 360bbb-3(b)(1), unless the authorization is terminated or revoked.     Resp Syncytial Virus by PCR NEGATIVE NEGATIVE Final    Comment: (NOTE) Fact Sheet for Patients: bloggercourse.com  Fact Sheet for Healthcare Providers: seriousbroker.it  This test is not yet approved or cleared by the [place]  FDA and has been authorized for detection and/or diagnosis of SARS-CoV-2 by FDA under an Emergency Use Authorization (EUA). This EUA will remain in effect (meaning this test can be used) for the duration of the COVID-19 declaration under Section 564(b)(1) of the Act, 21 U.S.C. section 360bbb-3(b)(1), unless the authorization is terminated  or revoked.  Performed at [facility], [address] [place], [place] [address]   Culture, blood (Routine x 2)     Status: Abnormal   Collection Time: 09/29/23  2:11 PM   Specimen: BLOOD RIGHT FOREARM  Result Value Ref Range Status   Specimen Description   Final    BLOOD RIGHT FOREARM Performed at [facility], [address] [place], [place] [address]    Special Requests   Final    Blood Culture adequate  volume BOTTLES DRAWN AEROBIC AND ANAEROBIC Performed at [facility], [address], [place], [place] [address]    Culture  Setup Time   Final    GRAM POSITIVE COCCI IN BOTH AEROBIC AND ANAEROBIC BOTTLES Gram Stain Report Called to,Read Back By and Verified With: CHARMAINE BREEN [phone], COLLIN JINNY SETTLE STAIN REVIEWED-AGREE WITH RESULT DRT CRITICAL RESULT CALLED TO, READ BACK BY AND VERIFIED WITH: RN JENNIFER HELBERG ON 09/30/23 @ 1909 BY DRT Performed at [facility], [address] [place], [place] [address]    Culture STREPTOCOCCUS PNEUMONIAE (A)  Final   Report Status 10/02/2023 FINAL  Final   Organism ID, Bacteria STREPTOCOCCUS PNEUMONIAE  Final      Susceptibility   Streptococcus pneumoniae - MIC*    ERYTHROMYCIN <=0.12 SENSITIVE Sensitive     LEVOFLOXACIN  0.5 SENSITIVE Sensitive     VANCOMYCIN  0.5 SENSITIVE Sensitive     PENICILLIN (meningitis) <=0.06 SENSITIVE Sensitive     PENO - penicillin <=0.06      PENICILLIN (non-meningitis) <=0.06 SENSITIVE Sensitive     PENICILLIN (oral) <=0.06 SENSITIVE Sensitive     CEFTRIAXONE  (non-meningitis) <=0.12 SENSITIVE Sensitive     CEFTRIAXONE  (meningitis) <=0.12 SENSITIVE Sensitive     * STREPTOCOCCUS PNEUMONIAE  Culture, blood (Routine x 2)     Status: Abnormal   Collection Time: 09/29/23  2:11 PM   Specimen: BLOOD LEFT HAND  Result Value Ref Range Status   Specimen Description   Final    BLOOD LEFT HAND Performed at [facility], [address], [place], [place] [address]    Special Requests   Final     BOTTLES DRAWN AEROBIC AND ANAEROBIC Blood Culture adequate volume Performed at [facility], [address] [place], [place] [address]    Culture  Setup Time   Final    GRAM POSITIVE COCCI IN BOTH AEROBIC AND ANAEROBIC BOTTLES Gram Stain Report Called to,Read Back By and Verified With: CHARMAINE BREEN [phone], COLLIN JINNY SETTLE STAIN REVIEWED-AGREE WITH RESULT DRT    Culture (A)  Final    STREPTOCOCCUS PNEUMONIAE SUSCEPTIBILITIES PERFORMED ON PREVIOUS CULTURE WITHIN THE LAST 5 DAYS. STAPHYLOCOCCUS CAPITIS THE SIGNIFICANCE OF ISOLATING THIS ORGANISM FROM A SINGLE SET OF BLOOD CULTURES WHEN MULTIPLE SETS ARE DRAWN IS UNCERTAIN. PLEASE NOTIFY THE MICROBIOLOGY DEPARTMENT WITHIN ONE WEEK IF SPECIATION AND SENSITIVITIES ARE REQUIRED. Performed at [facility], [address], [place], [place] [address]    Report Status 10/02/2023 FINAL  Final  Blood Culture ID Panel (Reflexed)     Status: Abnormal   Collection Time: 09/29/23  2:11 PM  Result Value Ref Range Status   Enterococcus faecalis NOT DETECTED NOT DETECTED Final   Enterococcus Faecium NOT DETECTED NOT DETECTED Final   Listeria monocytogenes NOT DETECTED NOT DETECTED Final   Staphylococcus species NOT DETECTED NOT DETECTED Final   Staphylococcus aureus (BCID) NOT DETECTED NOT DETECTED Final   Staphylococcus epidermidis NOT DETECTED NOT DETECTED Final   Staphylococcus lugdunensis NOT DETECTED NOT DETECTED Final   Streptococcus species DETECTED (A) NOT DETECTED Final    Comment: CRITICAL RESULT CALLED TO, READ BACK BY AND VERIFIED WITH: RN JENNIFER HELBERG ON 09/30/23 @ 1909 BY DRT    Streptococcus agalactiae NOT DETECTED NOT DETECTED Final   Streptococcus pneumoniae DETECTED (A) NOT DETECTED Final    Comment: CRITICAL RESULT CALLED TO, READ BACK BY AND VERIFIED WITH: RN JENNIFER HELBERG ON 09/30/23 @ 1909 BY DRT    Streptococcus pyogenes NOT DETECTED NOT DETECTED  Final   A.calcoaceticus-baumannii NOT DETECTED NOT DETECTED  Final   Bacteroides fragilis NOT DETECTED NOT DETECTED Final   Enterobacterales NOT DETECTED NOT DETECTED Final   Enterobacter cloacae complex NOT DETECTED NOT DETECTED Final   Escherichia coli NOT DETECTED NOT DETECTED Final   Klebsiella aerogenes NOT DETECTED NOT DETECTED Final   Klebsiella oxytoca NOT DETECTED NOT DETECTED Final   Klebsiella pneumoniae NOT DETECTED NOT DETECTED Final   Proteus species NOT DETECTED NOT DETECTED Final   Salmonella species NOT DETECTED NOT DETECTED Final   Serratia marcescens NOT DETECTED NOT DETECTED Final   Haemophilus influenzae NOT DETECTED NOT DETECTED Final   Neisseria meningitidis NOT DETECTED NOT DETECTED Final   Pseudomonas aeruginosa NOT DETECTED NOT DETECTED Final   Stenotrophomonas maltophilia NOT DETECTED NOT DETECTED Final   Candida albicans NOT DETECTED NOT DETECTED Final   Candida auris NOT DETECTED NOT DETECTED Final   Candida glabrata NOT DETECTED NOT DETECTED Final   Candida krusei NOT DETECTED NOT DETECTED Final   Candida parapsilosis NOT DETECTED NOT DETECTED Final   Candida tropicalis NOT DETECTED NOT DETECTED Final   Cryptococcus neoformans/gattii NOT DETECTED NOT DETECTED Final    Comment: Performed at [facility], [address] [place], [place] [address]  Culture, blood (Routine X 2) w Reflex to ID Panel     Status: None (Preliminary result)   Collection Time: 10/01/23  4:35 AM   Specimen: BLOOD  Result Value Ref Range Status   Specimen Description BLOOD LEFT WRIST  Final   Special Requests   Final    BOTTLES DRAWN AEROBIC AND ANAEROBIC Blood Culture adequate volume   Culture   Final    NO GROWTH 4 DAYS Performed at [facility], [address], [place], [place] [address]    Report Status PENDING  Incomplete  Culture, blood (Routine X 2) w Reflex to ID Panel     Status: None (Preliminary result)   Collection Time: 10/01/23  4:35 AM   Specimen: BLOOD  Result Value Ref Range Status   Specimen Description BLOOD  LEFT HAND  Final   Special Requests   Final    BOTTLES DRAWN AEROBIC AND ANAEROBIC Blood Culture adequate volume   Culture   Final    NO GROWTH 4 DAYS Performed at [facility], [address] [place], [place] [address]    Report Status PENDING  Incomplete    Radiology Studies: No results found.  Scheduled Meds:  amLODipine   5 mg Oral QPM   aspirin   325 mg Oral Daily   budesonide  (PULMICORT ) nebulizer solution  0.5 mg Nebulization BID   clopidogrel   75 mg Oral Daily   diazepam   5 mg Oral BID   heparin   5,000 Units Subcutaneous Q8H   HYDROcodone -acetaminophen   1 tablet Oral BID   ipratropium-albuterol   3 mL Nebulization BID   levothyroxine   75 mcg Oral q AM   mirabegron  ER  50 mg Oral Daily   multivitamin with minerals  1 tablet Oral Daily   pantoprazole   40 mg Oral Daily   predniSONE   40 mg Oral Q breakfast   thiamine   100 mg Oral Daily   Continuous Infusions:  cefTRIAXone  (ROCEPHIN )  IV 2 g (10/05/23 1745)     LOS: 6 days    Time spent: 35 minutes    Brianni Manthe,MD [facility]  If 7PM-7AM, please contact night-coverage [email or web address] 10/05/2023, 6:12 PM

## 2023-10-05 NOTE — TOC Progression Note (Signed)
 Transition of Care [facility]) - Progression Note    Patient Details  Name: Jordan Ward MRN: [id number] Date of Birth: 1940-10-07  Transition of Care [facility]) CM/SW Contact  [name], [place] Phone Number: 10/05/2023, 10:49 AM  Clinical Narrative:    CSW followed up with pts wife regarding bed offers and what her decision is for pt to go to SNF. Pts wife states that the doctor yesterday told her that [facility] was being looked at. CSW explained that as explained on Friday [facility] declined and did not offer a bed, the options are [facility] or [facility]. Pts wife states excuse me ma'am I am just telling you what the doctor told me. CSW explained that is understood but the doctors do not have a say in what facilities offer a SNF bed. CSW then explained that [facility] declined offering a bed and the options are [facility] or [facility]. Pts wife states she has not made a decision. CSW explained call will be made later for her choice. TOC to follow.   Expected Discharge Plan: Skilled Nursing Facility Barriers to Discharge: Continued Medical Work up, SNF Covid  Expected Discharge Plan and Services In-house Referral: Clinical Social Work   Post Acute Care Choice: Skilled Nursing Facility Living arrangements for the past 2 months: Single Family Home                                       Social Determinants of Health (SDOH) Interventions SDOH Screenings   Food Insecurity: No Food Insecurity (09/30/2023)  Housing: Unknown (09/30/2023)  Transportation Needs: No Transportation Needs (09/30/2023)  Utilities: Not At Risk (09/30/2023)  Depression (PHQ2-9): Low Risk  (09/26/2022)  Financial Resource Strain: Low Risk  (09/26/2022)  Social Connections: Socially Isolated (09/30/2023)  Stress: No Stress Concern Present (09/26/2022)  Tobacco Use: Medium Risk (09/29/2023)    Readmission Risk Interventions    08/25/2022    3:58 PM 07/15/2021    9:54 AM 02/15/2021   11:47 AM  Readmission Risk Prevention Plan  Transportation Screening  Complete    PCP or Specialist Appt within 5-7 Days   Complete  PCP or Specialist Appt within 3-5 Days  Complete   Home Care Screening Complete  Complete  Medication Review (RN CM) Complete  Complete  HRI or Home Care Consult  Complete   Social Work Consult for Recovery Care Planning/Counseling  Complete   Palliative Care Screening  Complete   Medication Review Oceanographer)  Complete

## 2023-10-06 ENCOUNTER — Telehealth: Payer: Medicare Other | Admitting: Urology

## 2023-10-06 DIAGNOSIS — Z936 Other artificial openings of urinary tract status: Secondary | ICD-10-CM | POA: Diagnosis not present

## 2023-10-06 DIAGNOSIS — U071 COVID-19: Secondary | ICD-10-CM | POA: Diagnosis not present

## 2023-10-06 DIAGNOSIS — E039 Hypothyroidism, unspecified: Secondary | ICD-10-CM | POA: Diagnosis not present

## 2023-10-06 DIAGNOSIS — G35 Multiple sclerosis: Secondary | ICD-10-CM | POA: Diagnosis not present

## 2023-10-06 LAB — CULTURE, BLOOD (ROUTINE X 2)
Culture: NO GROWTH
Culture: NO GROWTH
Special Requests: ADEQUATE
Special Requests: ADEQUATE

## 2023-10-06 NOTE — Plan of Care (Signed)
  Problem: Education: Goal: Knowledge of risk factors and measures for prevention of condition will improve Outcome: Progressing   Problem: Coping: Goal: Psychosocial and spiritual needs will be supported Outcome: Progressing   Problem: Respiratory: Goal: Will maintain a patent airway Outcome: Progressing   Problem: Education: Goal: Knowledge of General Education information will improve Description: Including pain rating scale, medication(s)/side effects and non-pharmacologic comfort measures Outcome: Progressing   Problem: Health Behavior/Discharge Planning: Goal: Ability to manage health-related needs will improve Outcome: Progressing   Problem: Clinical Measurements: Goal: Ability to maintain clinical measurements within normal limits will improve Outcome: Progressing Goal: Will remain free from infection Outcome: Progressing Goal: Diagnostic test results will improve Outcome: Progressing Goal: Respiratory complications will improve Outcome: Progressing Goal: Cardiovascular complication will be avoided Outcome: Progressing   Problem: Pain Management: Goal: General experience of comfort will improve Outcome: Progressing   Problem: Safety: Goal: Ability to remain free from injury will improve Outcome: Progressing   Problem: Education: Goal: Knowledge of disease or condition will improve Outcome: Progressing   Problem: Ischemic Stroke/TIA Tissue Perfusion: Goal: Complications of ischemic stroke/TIA will be minimized 10/06/2023 0453 by Bethena Corean CROME, LPN Outcome: Progressing 10/06/2023 0308 by Bethena Corean CROME, LPN Outcome: Progressing   Problem: Coping: Goal: Will identify appropriate support needs Outcome: Progressing   Problem: Self-Care: Goal: Ability to participate in self-care as condition permits will improve Outcome: Progressing

## 2023-10-06 NOTE — Plan of Care (Signed)
  Problem: Education: Goal: Knowledge of risk factors and measures for prevention of condition will improve Outcome: Progressing   Problem: Coping: Goal: Psychosocial and spiritual needs will be supported Outcome: Progressing   Problem: Respiratory: Goal: Will maintain a patent airway Outcome: Progressing   Problem: Education: Goal: Knowledge of General Education information will improve Description: Including pain rating scale, medication(s)/side effects and non-pharmacologic comfort measures Outcome: Progressing   Problem: Health Behavior/Discharge Planning: Goal: Ability to manage health-related needs will improve Outcome: Progressing   Problem: Clinical Measurements: Goal: Ability to maintain clinical measurements within normal limits will improve Outcome: Progressing Goal: Will remain free from infection Outcome: Progressing Goal: Diagnostic test results will improve Outcome: Progressing Goal: Respiratory complications will improve Outcome: Progressing Goal: Cardiovascular complication will be avoided Outcome: Progressing   Problem: Pain Management: Goal: General experience of comfort will improve Outcome: Progressing   Problem: Safety: Goal: Ability to remain free from injury will improve Outcome: Progressing   Problem: Education: Goal: Knowledge of disease or condition will improve Outcome: Progressing   Problem: Ischemic Stroke/TIA Tissue Perfusion: Goal: Complications of ischemic stroke/TIA will be minimized Outcome: Progressing   Problem: Coping: Goal: Will identify appropriate support needs Outcome: Progressing   Problem: Self-Care: Goal: Ability to participate in self-care as condition permits will improve Outcome: Progressing

## 2023-10-06 NOTE — Progress Notes (Signed)
 PROGRESS NOTE    Jordan Ward  FMW:[id number] DOB: August 11, 1941 DOA: 09/29/2023 PCP: Shona Norleen PEDLAR, MD   Brief Narrative:    Mr. Blakeman, an 83 y/o with multiple co-morbidities including, MS, CKD4, neurogenic bladder with superpubic cystostomy catheter, obstructed left ureter with nephrostomy tube, COPD/Emphysema on home oxygen , hypothyroidism on replacement, dysphagia, non-healing wound right ankle in setting of PAD, HFpEF, cerebral vascular disease. Over the past several days he has been weaker, more short of breath, coughing, left-sided LE weakness, increased slurred speech.  Patient was admitted with COVID-pneumonia as well as significant cerebrovascular disease with small, acute CVA.  He is now noted to have strep pneumonia bacteremia as well.  Assessment & Plan:   Principal Problem:   Pneumonia due to COVID-19 virus Active Problems:   UTI (urinary tract infection)   Cerebrovascular disease   Chronic diastolic (congestive) heart failure (HCC)   CKD (chronic kidney disease) stage 4, GFR 15-29 ml/min (HCC)   Nephrostomy present (HCC)   Emphysema lung (HCC)   Multiple sclerosis (HCC)   Chronic suprapubic catheter (HCC)   Dysphagia   Hypothyroidism   Dilated cardiomyopathy (HCC)   Essential hypertension   COVID-19  Assessment and Plan:   Pneumonia due to COVID-19 virus - remdesivir  infusions has been completed. -Continue treatment with steroids, mucolytic's, flutter valve and bronchodilator management -Will continue supportive care and follow clinical response. -Needs isolation and quarantine through 1/11 until SNF can accept him. -TOC on more and assisting with placement.   Cerebrovascular disease with small ischemic CVA -Complete stroke workup -Follow neurology service recommendation -Prior to admission using aspirin  for secondary prevention -Patient out of the window for any thrombolytic management or intervention. -Will provide treatment with aspirin  and Plavix  for  secondary prevention. -Per PT/OT recommendations patient will need a skilled nursing facility at discharge -LDL 16 -TOC aware and helping with placement.  Hypernatremia-improved -Complete D5 fluid and resume as needed -Monitor repeat labs in a.m.   Strep pneumonia bacteremia -Associated with chronic suprapubic catheter -Continue current IV antibiotics with sensitivity noted to Rocephin  day 6/10   Emphysema lung (HCC) -Continue oxygen  supplementation -Continue bronchodilator management -Receiving steroids as part of treatment for COVID -Follow clinical response and continue supportive care.   Nephrostomy present (HCC) -Continue nephrostomy tube care -Continue current antibiotic for UTI -Continue patient follow-up with urology service.   CKD (chronic kidney disease) stage 4, GFR 15-29 ml/min (HCC) -Continue to follow renal function trend -Continue minimizing nephrotoxic agents and avoid the use of contrast and hypotension. -Good urine output currently appreciated.   Chronic diastolic (congestive) heart failure (HCC) -Elevated BNP was present at time of admission -Some decreased breath sounds reported by admitting doctor patient's lung bases. -Provided on 09/30/2023 -Currently appears to be euvolemic; will hold on any further diuresis for the moment. -Follow daily weights and urine output -Low-sodium diet discussed with patient -Gentle IV fluid initiated as noted above   Essential hypertension -Stable for the most part -Continue to follow vital signs.   Dilated cardiomyopathy (HCC) -2D echocardiogram with LVEF 30-35% with global hypokinesis and grade 1 diastolic dysfunction -No significant signs of fluid overload on exam. -Continue to follow daily weights and strict I's and O's.   Hypothyroidism -TSH elevated -Free T4 1.07 -Continue Synthroid .   Dysphagia -Long standing problem which he attributes to MS. Now with evidence of new CVA -Appreciate assistance and  recommendation by speech therapy; dysphagia 2 diet with honey thick liquids recommended while inpatient.   Chronic suprapubic catheter (HCC) -  Catheter site appears clean and normal -No erythematous changes or drainage -Continue current IV antibiotics -Continue local care. -Continue patient follow-up with urology service. -Resumed home Myrbetriq    Multiple sclerosis (HCC) -No receiving any medications at the moment -Continue outpatient follow-up with neurology.  DVT prophylaxis:Heparin  Code Status: Full Family Communication: Discussed with wife at bedside 1/4 Disposition Plan:  Status is: Inpatient Remains inpatient appropriate because: Need for placement and IV antibiotics.  Skin Assessment:  I have examined the patient's skin and I agree with the wound assessment as performed by the wound care RN as outlined below:  Pressure Injury 09/30/23 Ankle Left;Lateral Stage 4 - Full thickness tissue loss with exposed bone, tendon or muscle. 1x1cm open area to outside of right ankle, (Active)  09/30/23 1301  Location: Ankle  Location Orientation: Left;Lateral  Staging: Stage 4 - Full thickness tissue loss with exposed bone, tendon or muscle.  Wound Description (Comments): 1x1cm open area to outside of right ankle,  Present on Admission: Yes  Dressing Type Foam - Lift dressing to assess site every shift 10/02/23 2300     Pressure Injury 09/30/23 Buttocks Right;Left Deep Tissue Pressure Injury - Purple or maroon localized area of discolored intact skin or blood-filled blister due to damage of underlying soft tissue from pressure and/or shear. red nonblanchable area to sac (Active)  09/30/23 1130  Location: Buttocks  Location Orientation: Right;Left  Staging: Deep Tissue Pressure Injury - Purple or maroon localized area of discolored intact skin or blood-filled blister due to damage of underlying soft tissue from pressure and/or shear.  Wound Description (Comments): red nonblanchable area  to sacrum and buttocks with purple are in the center, under right buttock a 0.5x0.5 open area  Present on Admission: Yes  Dressing Type Foam - Lift dressing to assess site every shift 10/02/23 2300    Consultants:  None  Procedures:  None  Antimicrobials:  Anti-infectives (From admission, onward)    Start     Dose/Rate Route Frequency Ordered Stop   10/01/23 1600  cefTRIAXone  (ROCEPHIN ) 2 g in sodium chloride  0.9 % 100 mL IVPB        2 g 200 mL/hr over 30 Minutes Intravenous Every 24 hours 09/30/23 1940 10/09/23 2359   09/30/23 2030  cefTRIAXone  (ROCEPHIN ) 1 g in sodium chloride  0.9 % 100 mL IVPB        1 g 200 mL/hr over 30 Minutes Intravenous  Once 09/30/23 1940 10/01/23 1416   09/30/23 1600  cefTRIAXone  (ROCEPHIN ) 1 g in sodium chloride  0.9 % 100 mL IVPB  Status:  Discontinued        1 g 200 mL/hr over 30 Minutes Intravenous Every 24 hours 09/29/23 2318 09/30/23 1940   09/30/23 1000  remdesivir  100 mg in sodium chloride  0.9 % 100 mL IVPB  Status:  Discontinued       Placed in Followed by Linked Group   100 mg 200 mL/hr over 30 Minutes Intravenous Daily 09/29/23 2317 09/29/23 2318   09/30/23 1000  remdesivir  100 mg in sodium chloride  0.9 % 100 mL IVPB       Placed in Followed by Linked Group   100 mg 200 mL/hr over 30 Minutes Intravenous Daily 09/29/23 2319 10/01/23 0902   09/29/23 2348  remdesivir  100 mg in sodium chloride  0.9 % 100 mL IVPB       Placed in Followed by Linked Group   100 mg 200 mL/hr over 30 Minutes Intravenous  Once 09/29/23 2319 09/30/23 0222   09/29/23 2330  remdesivir  100 mg in sodium chloride  0.9 % 100 mL IVPB       Placed in Followed by Linked Group   100 mg 200 mL/hr over 30 Minutes Intravenous  Once 09/29/23 2319 09/30/23 0118   09/29/23 2315  remdesivir  200 mg in sodium chloride  0.9% 250 mL IVPB  Status:  Discontinued       Placed in Followed by Linked Group   200 mg 580 mL/hr over 30 Minutes Intravenous Once 09/29/23 2317 09/29/23 2318    09/29/23 1515  cefTRIAXone  (ROCEPHIN ) 1 g in sodium chloride  0.9 % 100 mL IVPB        1 g 200 mL/hr over 30 Minutes Intravenous  Once 09/29/23 1506 09/29/23 1627   09/29/23 1515  azithromycin  (ZITHROMAX ) 500 mg in sodium chloride  0.9 % 250 mL IVPB        500 mg 250 mL/hr over 60 Minutes Intravenous  Once 09/29/23 1506 09/29/23 1924      Subjective: Chronically ill in appearance; weak and deconditioned.  No chest pain, no nausea, no vomiting.  Good saturation on 2 L.  No overnight events reported.  Objective: Vitals:   10/05/23 1936 10/05/23 2115 10/06/23 0510 10/06/23 0731  BP:  110/65 131/83   Pulse:  88 77   Resp:  18 16   Temp:  (!) 97.4 F (36.3 C) 98.7 F (37.1 C)   TempSrc:  Oral    SpO2: 93% 95% 98% 98%  Weight:      Height:        Intake/Output Summary (Last 24 hours) at 10/06/2023 1826 Last data filed at 10/06/2023 1700 Gross per 24 hour  Intake 960 ml  Output 1000 ml  Net -40 ml   Filed Weights   09/29/23 1331  Weight: 50.8 kg    Examination: General exam: Alert, awake, oriented x 3; no acute distress.  No overnight events. Respiratory system: Good air movement bilaterally; no wheezing, no crackles, 2 L of cannula in place. Cardiovascular system:RRR. No murmurs, rubs, gallops. Gastrointestinal system: Abdomen is nondistended, soft and nontender.  Nephrostomy tube and suprapubic catheter in place; area clean and dry.  Good flow appreciated. Central nervous system: No focal neurological deficits. Extremities: No cyanosis or clubbing. Skin: No petechiae. Psychiatry: Judgement and insight appear normal.  Flat affect appreciated.  Data Reviewed: I have personally reviewed following labs and imaging studies  CBC: Recent Labs  Lab 10/01/23 0435 10/02/23 0339 10/03/23 0402 10/05/23 0458  WBC 5.8 6.2 11.2* 19.2*  HGB 10.8* 11.0* 11.3* 11.2*  HCT 34.2* 33.9* 36.0* 34.5*  MCV 92.2 90.4 91.1 92.2  PLT 142* 158 197 220   Basic Metabolic Panel: Recent Labs   Lab 10/01/23 0435 10/02/23 0339 10/03/23 0402 10/04/23 0340 10/05/23 0458  NA 147* 146* 153* 147* 143  K 3.5 3.4* 4.0 4.8 5.1  CL 119* 120* 123* 118* 118*  CO2 18* 18* 21* 21* 17*  GLUCOSE 178* 193* 154* 175* 153*  BUN 59* 70* 71* 63* 69*  CREATININE 2.51* 2.59* 2.31* 2.04* 2.07*  CALCIUM  8.2* 8.3* 8.4* 8.1* 8.1*  MG  --  2.0 2.2  --  2.2   GFR: Estimated Creatinine Clearance: 20.5 mL/min (A) (by C-G formula based on SCr of 2.07 mg/dL (H)).  Liver Function Tests: Recent Labs  Lab 10/01/23 0435  AST 28  ALT 20  ALKPHOS 58  BILITOT 0.4  PROT 6.3*  ALBUMIN 2.2*   No results for input(s): LIPASE, AMYLASE in the last 168 hours.  CBG: Recent Labs  Lab 10/03/23 0325  GLUCAP 146*   Sepsis Labs: Recent Labs  Lab 10/01/23 0435  PROCALCITON 6.71    Recent Results (from the past 240 hours)  Resp panel by RT-PCR (RSV, Flu A&B, Covid) Anterior Nasal Swab     Status: Abnormal   Collection Time: 09/29/23 12:13 PM   Specimen: Anterior Nasal Swab  Result Value Ref Range Status   SARS Coronavirus 2 by RT PCR POSITIVE (A) NEGATIVE Final    Comment: (NOTE) SARS-CoV-2 target nucleic acids are DETECTED.  The SARS-CoV-2 RNA is generally detectable in upper respiratory specimens during the acute phase of infection. Positive results are indicative of the presence of the identified virus, but do not rule out bacterial infection or co-infection with other pathogens not detected by the test. Clinical correlation with patient history and other diagnostic information is necessary to determine patient infection status. The expected result is Negative.  Fact Sheet for Patients: bloggercourse.com  Fact Sheet for Healthcare Providers: seriousbroker.it  This test is not yet approved or cleared by the [place]  FDA and  has been authorized for detection and/or diagnosis of SARS-CoV-2 by FDA under an Emergency Use  Authorization (EUA).  This EUA will remain in effect (meaning this test can be used) for the duration of  the COVID-19 declaration under Section 564(b)(1) of the A ct, 21 U.S.C. section 360bbb-3(b)(1), unless the authorization is terminated or revoked sooner.     Influenza A by PCR NEGATIVE NEGATIVE Final   Influenza B by PCR NEGATIVE NEGATIVE Final    Comment: (NOTE) The Xpert Xpress SARS-CoV-2/FLU/RSV plus assay is intended as an aid in the diagnosis of influenza from Nasopharyngeal swab specimens and should not be used as a sole basis for treatment. Nasal washings and aspirates are unacceptable for Xpert Xpress SARS-CoV-2/FLU/RSV testing.  Fact Sheet for Patients: bloggercourse.com  Fact Sheet for Healthcare Providers: seriousbroker.it  This test is not yet approved or cleared by the [place]  FDA and has been authorized for detection and/or diagnosis of SARS-CoV-2 by FDA under an Emergency Use Authorization (EUA). This EUA will remain in effect (meaning this test can be used) for the duration of the COVID-19 declaration under Section 564(b)(1) of the Act, 21 U.S.C. section 360bbb-3(b)(1), unless the authorization is terminated or revoked.     Resp Syncytial Virus by PCR NEGATIVE NEGATIVE Final    Comment: (NOTE) Fact Sheet for Patients: bloggercourse.com  Fact Sheet for Healthcare Providers: seriousbroker.it  This test is not yet approved or cleared by the [place]  FDA and has been authorized for detection and/or diagnosis of SARS-CoV-2 by FDA under an Emergency Use Authorization (EUA). This EUA will remain in effect (meaning this test can be used) for the duration of the COVID-19 declaration under Section 564(b)(1) of the Act, 21 U.S.C. section 360bbb-3(b)(1), unless the authorization is terminated or revoked.  Performed at [facility], [address],  [place], [place] [address]   Culture, blood (Routine x 2)     Status: Abnormal   Collection Time: 09/29/23  2:11 PM   Specimen: BLOOD RIGHT FOREARM  Result Value Ref Range Status   Specimen Description   Final    BLOOD RIGHT FOREARM Performed at [facility], [address], [place], [place] [address]    Special Requests   Final    Blood Culture adequate volume BOTTLES DRAWN AEROBIC AND ANAEROBIC Performed at [facility], [address], [place], [place] [address]    Culture  Setup  Time   Final    GRAM POSITIVE COCCI IN BOTH AEROBIC AND ANAEROBIC BOTTLES Gram Stain Report Called to,Read Back By and Verified With: CHARMAINE BREEN [phone], COLLIN JINNY SETTLE STAIN REVIEWED-AGREE WITH RESULT DRT CRITICAL RESULT CALLED TO, READ BACK BY AND VERIFIED WITH: RN JENNIFER HELBERG ON 09/30/23 @ 1909 BY DRT Performed at [facility], [address] [place], [place] [address]    Culture STREPTOCOCCUS PNEUMONIAE (A)  Final   Report Status 10/02/2023 FINAL  Final   Organism ID, Bacteria STREPTOCOCCUS PNEUMONIAE  Final      Susceptibility   Streptococcus pneumoniae - MIC*    ERYTHROMYCIN <=0.12 SENSITIVE Sensitive     LEVOFLOXACIN  0.5 SENSITIVE Sensitive     VANCOMYCIN  0.5 SENSITIVE Sensitive     PENICILLIN (meningitis) <=0.06 SENSITIVE Sensitive     PENO - penicillin <=0.06      PENICILLIN (non-meningitis) <=0.06 SENSITIVE Sensitive     PENICILLIN (oral) <=0.06 SENSITIVE Sensitive     CEFTRIAXONE  (non-meningitis) <=0.12 SENSITIVE Sensitive     CEFTRIAXONE  (meningitis) <=0.12 SENSITIVE Sensitive     * STREPTOCOCCUS PNEUMONIAE  Culture, blood (Routine x 2)     Status: Abnormal   Collection Time: 09/29/23  2:11 PM   Specimen: BLOOD LEFT HAND  Result Value Ref Range Status   Specimen Description   Final    BLOOD LEFT HAND Performed at [facility], [address] [place], [place] [address]    Special Requests   Final    BOTTLES DRAWN AEROBIC AND ANAEROBIC Blood Culture adequate  volume Performed at [facility], [address] [place], [place] [address]    Culture  Setup Time   Final    GRAM POSITIVE COCCI IN BOTH AEROBIC AND ANAEROBIC BOTTLES Gram Stain Report Called to,Read Back By and Verified With: CHARMAINE BREEN [phone], COLLIN JINNY SETTLE STAIN REVIEWED-AGREE WITH RESULT DRT    Culture (A)  Final    STREPTOCOCCUS PNEUMONIAE SUSCEPTIBILITIES PERFORMED ON PREVIOUS CULTURE WITHIN THE LAST 5 DAYS. STAPHYLOCOCCUS CAPITIS THE SIGNIFICANCE OF ISOLATING THIS ORGANISM FROM A SINGLE SET OF BLOOD CULTURES WHEN MULTIPLE SETS ARE DRAWN IS UNCERTAIN. PLEASE NOTIFY THE MICROBIOLOGY DEPARTMENT WITHIN ONE WEEK IF SPECIATION AND SENSITIVITIES ARE REQUIRED. Performed at [facility], [address], [place], [place] [address]    Report Status 10/02/2023 FINAL  Final  Blood Culture ID Panel (Reflexed)     Status: Abnormal   Collection Time: 09/29/23  2:11 PM  Result Value Ref Range Status   Enterococcus faecalis NOT DETECTED NOT DETECTED Final   Enterococcus Faecium NOT DETECTED NOT DETECTED Final   Listeria monocytogenes NOT DETECTED NOT DETECTED Final   Staphylococcus species NOT DETECTED NOT DETECTED Final   Staphylococcus aureus (BCID) NOT DETECTED NOT DETECTED Final   Staphylococcus epidermidis NOT DETECTED NOT DETECTED Final   Staphylococcus lugdunensis NOT DETECTED NOT DETECTED Final   Streptococcus species DETECTED (A) NOT DETECTED Final    Comment: CRITICAL RESULT CALLED TO, READ BACK BY AND VERIFIED WITH: RN JENNIFER HELBERG ON 09/30/23 @ 1909 BY DRT    Streptococcus agalactiae NOT DETECTED NOT DETECTED Final   Streptococcus pneumoniae DETECTED (A) NOT DETECTED Final    Comment: CRITICAL RESULT CALLED TO, READ BACK BY AND VERIFIED WITH: RN JENNIFER HELBERG ON 09/30/23 @ 1909 BY DRT    Streptococcus pyogenes NOT DETECTED NOT DETECTED Final   A.calcoaceticus-baumannii NOT DETECTED NOT DETECTED Final   Bacteroides fragilis NOT DETECTED NOT DETECTED Final    Enterobacterales NOT  DETECTED NOT DETECTED Final   Enterobacter cloacae complex NOT DETECTED NOT DETECTED Final   Escherichia coli NOT DETECTED NOT DETECTED Final   Klebsiella aerogenes NOT DETECTED NOT DETECTED Final   Klebsiella oxytoca NOT DETECTED NOT DETECTED Final   Klebsiella pneumoniae NOT DETECTED NOT DETECTED Final   Proteus species NOT DETECTED NOT DETECTED Final   Salmonella species NOT DETECTED NOT DETECTED Final   Serratia marcescens NOT DETECTED NOT DETECTED Final   Haemophilus influenzae NOT DETECTED NOT DETECTED Final   Neisseria meningitidis NOT DETECTED NOT DETECTED Final   Pseudomonas aeruginosa NOT DETECTED NOT DETECTED Final   Stenotrophomonas maltophilia NOT DETECTED NOT DETECTED Final   Candida albicans NOT DETECTED NOT DETECTED Final   Candida auris NOT DETECTED NOT DETECTED Final   Candida glabrata NOT DETECTED NOT DETECTED Final   Candida krusei NOT DETECTED NOT DETECTED Final   Candida parapsilosis NOT DETECTED NOT DETECTED Final   Candida tropicalis NOT DETECTED NOT DETECTED Final   Cryptococcus neoformans/gattii NOT DETECTED NOT DETECTED Final    Comment: Performed at [facility], [address], [place], [place] [address]  Culture, blood (Routine X 2) w Reflex to ID Panel     Status: None   Collection Time: 10/01/23  4:35 AM   Specimen: BLOOD  Result Value Ref Range Status   Specimen Description BLOOD LEFT WRIST  Final   Special Requests   Final    BOTTLES DRAWN AEROBIC AND ANAEROBIC Blood Culture adequate volume   Culture   Final    NO GROWTH 5 DAYS Performed at [facility], [address] [place], [place] [address]    Report Status 10/06/2023 FINAL  Final  Culture, blood (Routine X 2) w Reflex to ID Panel     Status: None   Collection Time: 10/01/23  4:35 AM   Specimen: BLOOD  Result Value Ref Range Status   Specimen Description BLOOD LEFT HAND  Final   Special Requests   Final    BOTTLES DRAWN AEROBIC AND ANAEROBIC Blood Culture  adequate volume   Culture   Final    NO GROWTH 5 DAYS Performed at [facility], [address] [place], [place] [address]    Report Status 10/06/2023 FINAL  Final    Radiology Studies: No results found.  Scheduled Meds:  amLODipine   5 mg Oral QPM   aspirin   325 mg Oral Daily   budesonide  (PULMICORT ) nebulizer solution  0.5 mg Nebulization BID   clopidogrel   75 mg Oral Daily   diazepam   5 mg Oral BID   heparin   5,000 Units Subcutaneous Q8H   HYDROcodone -acetaminophen   1 tablet Oral BID   ipratropium-albuterol   3 mL Nebulization BID   levothyroxine   75 mcg Oral q AM   mirabegron  ER  50 mg Oral Daily   multivitamin with minerals  1 tablet Oral Daily   pantoprazole   40 mg Oral Daily   predniSONE   40 mg Oral Q breakfast   thiamine   100 mg Oral Daily   Continuous Infusions:  cefTRIAXone  (ROCEPHIN )  IV 2 g (10/06/23 1617)     LOS: 7 days    Time spent: 35 minutes    Joas Motton,MD [facility]  If 7PM-7AM, please contact night-coverage [email or web address] 10/06/2023, 6:26 PM

## 2023-10-06 NOTE — TOC Progression Note (Signed)
 Transition of Care [facility]) - Progression Note    Patient Details  Name: Jordan Ward MRN: [id number] Date of Birth: 1941/03/01  Transition of Care [facility]) CM/SW Contact  Rollo Petri, LCSW Phone Number: 10/06/2023, 10:24 AM  Clinical Narrative:     TOC following. Spoke with pt's wife to follow up on dc planning. Reviewed SNF bed offers and she selected [facility] for pt. Updated [name] at [facility] who states that the official end of pt's quarantine is 10/10/23 (Saturday) and they no longer accept new admissions on the weekend so they will admit pt on 10/12/23 (Monday).   Updated pt's wife and MD. TOC will follow.  Expected Discharge Plan: Skilled Nursing Facility Barriers to Discharge: SNF Covid Recovering, SNF Covid  Expected Discharge Plan and Services In-house Referral: Clinical Social Work   Post Acute Care Choice: Skilled Nursing Facility Living arrangements for the past 2 months: Single Family Home                                       Social Determinants of Health (SDOH) Interventions SDOH Screenings   Food Insecurity: No Food Insecurity (09/30/2023)  Housing: Unknown (09/30/2023)  Transportation Needs: No Transportation Needs (09/30/2023)  Utilities: Not At Risk (09/30/2023)  Depression (PHQ2-9): Low Risk  (09/26/2022)  Financial Resource Strain: Low Risk  (09/26/2022)  Social Connections: Socially Isolated (09/30/2023)  Stress: No Stress Concern Present (09/26/2022)  Tobacco Use: Medium Risk (09/29/2023)    Readmission Risk Interventions    08/25/2022    3:58 PM 07/15/2021    9:54 AM 02/15/2021   11:47 AM  Readmission Risk Prevention Plan  Transportation Screening Complete    PCP or Specialist Appt within 5-7 Days   Complete  PCP or Specialist Appt within 3-5 Days  Complete   Home Care Screening Complete  Complete  Medication Review (RN CM) Complete  Complete  HRI or Home Care Consult  Complete   Social Work Consult for Recovery Care Planning/Counseling  Complete    Palliative Care Screening  Complete   Medication Review Oceanographer)  Complete

## 2023-10-06 NOTE — Progress Notes (Signed)
 Physical Therapy Treatment Patient Details Name: Jordan Ward MRN: [id number] DOB: 1941/03/22 Today's Date: 10/06/2023   History of Present Illness Mr. Grisby, an 83 y/o with multiple co-morbidities including, MS, CKD4, neurogenic bladder with superpubic cystostomy catheter, obstructed left ureter with nephrostomy tube, COPD/Emphysema on home oxygen , hypothyroidism on replacement, dysphagia, non-healing wound right ankle in setting of PAD, HFpEF, cerebral vascular disease. Over the past several days he has been weaker, more short of breath, coughing, left-sided LE weakness, increased slurred speech. He denies having fever, N/V, diarrhea other than his chronic loose stools. He was here to fore able to manage many of his ADLs including bathing, eating, taking his medications. Due to his increased weakness, slurred speech, shortness of breath EMS was activated and he was brought to [facility] for evaluation.    PT Comments  Pt sitting in chair, agreeable to participate with therapy today. Began with seated exercises with overall good ROM, cues to hold and complete slower.  Pt required min assist with standing and general safety, unable to stand fully erect.  Pt ambulated to door and back with RW, drags Lt LE along and reports feeling of LT knee wanting to buckle though did not observe this.  Pt very fatigued upon return to chair.  Assistance needed for positioning.  Completed breathing exercises upon return.                 Precautions / Restrictions Precautions Precautions: Fall Restrictions Weight Bearing Restrictions Per Provider Order: No     Mobility  Bed Mobility    Pt was up in chair when therapist arrived           General bed mobility comments: pt was sitting up in chair upon arrival    Transfers Overall transfer level: Needs assistance Equipment used: Rolling walker (2 wheels) Transfers: Sit to/from Stand Sit to Stand: Min assist           General transfer comment: took extra  time; fatigues quickly with exertion; stands with trunk and head flexed and unable to come fully erect despite cues    Ambulation/Gait Ambulation/Gait assistance: Mod assist, Min assist Gait Distance (Feet): 15 Feet Assistive device: Rolling walker (2 wheels) Gait Pattern/deviations: Step-to pattern, Decreased step length - left, Shuffle, Knees buckling, Trunk flexed Gait velocity: decreased     General Gait Details: pt ambulated to door and back, tends to drag Lt LE due to weakness, shortness of breath, foward flexed with walker.  Pt fatigued upon return   Stairs    Not tested         Wheelchair Mobility     Tilt Bed    Modified Rankin (Stroke Patients Only)          Cognition Arousal: Alert Behavior During Therapy: WFL for tasks assessed/performed Overall Cognitive Status: No family/caregiver present to determine baseline cognitive functioning                                          Exercises General Exercises - Lower Extremity Ankle Circles/Pumps: AROM, Both, Seated, 10 reps Long Arc Quad: AROM, Strengthening, Both, 10 reps, Seated Hip ABduction/ADduction: AROM, Strengthening, Both, 10 reps, Seated Hip Flexion/Marching: AROM, Strengthening, Both, 10 reps, Seated        Pertinent Vitals/Pain Pain Assessment Pain Assessment: 0-10 Pain Score: 5  Pain Location: Rt ankle bone where wound is Pain Descriptors / Indicators: Aching  Pain Intervention(s): Repositioned     PT Goals (current goals can now be found in the care plan section) Progress towards PT goals: Progressing toward goals       PT Plan  Continue to progress towards goals.        AM-PAC PT 6 Clicks Mobility   Outcome Measure  Help needed turning from your back to your side while in a flat bed without using bedrails?: A Little Help needed moving from lying on your back to sitting on the side of a flat bed without using bedrails?: A Little Help needed moving to and from  a bed to a chair (including a wheelchair)?: A Lot Help needed standing up from a chair using your arms (e.g., wheelchair or bedside chair)?: A Lot Help needed to walk in hospital room?: A Lot Help needed climbing 3-5 steps with a railing? : A Lot 6 Click Score: 14    End of Session Equipment Utilized During Treatment: Oxygen ;Gait belt Activity Tolerance: Patient limited by fatigue Patient left: in chair;with call bell/phone within reach Nurse Communication: Other (comment) (pt request his bandage be changed on his Rt ankle) PT Visit Diagnosis: Unsteadiness on feet (R26.81);Other abnormalities of gait and mobility (R26.89);Muscle weakness (generalized) (M62.81)     Time: 1525-1550 PT Time Calculation (min) (ACUTE ONLY): 25 min  Charges:    $Gait Training: 8-22 mins $Therapeutic Exercise: 8-22 mins PT General Charges $$ ACUTE PT VISIT: 1 Visit                     Greig KATHEE Fuse, PTA/CLT [facility] Ph: [phone]    Fuse Greig KATHEE 10/06/2023, 3:56 PM

## 2023-10-06 NOTE — Plan of Care (Signed)
  Problem: Education: Goal: Knowledge of risk factors and measures for prevention of condition will improve Outcome: Progressing   Problem: Coping: Goal: Psychosocial and spiritual needs will be supported Outcome: Progressing   Problem: Respiratory: Goal: Will maintain a patent airway Outcome: Progressing Goal: Complications related to the disease process, condition or treatment will be avoided or minimized Outcome: Progressing   Problem: Education: Goal: Knowledge of General Education information will improve Description: Including pain rating scale, medication(s)/side effects and non-pharmacologic comfort measures Outcome: Progressing   Problem: Health Behavior/Discharge Planning: Goal: Ability to manage health-related needs will improve Outcome: Progressing   Problem: Clinical Measurements: Goal: Ability to maintain clinical measurements within normal limits will improve Outcome: Progressing Goal: Will remain free from infection Outcome: Progressing Goal: Diagnostic test results will improve Outcome: Progressing Goal: Respiratory complications will improve Outcome: Progressing Goal: Cardiovascular complication will be avoided Outcome: Progressing   Problem: Activity: Goal: Risk for activity intolerance will decrease Outcome: Progressing   Problem: Nutrition: Goal: Adequate nutrition will be maintained Outcome: Progressing   Problem: Coping: Goal: Level of anxiety will decrease Outcome: Progressing   Problem: Elimination: Goal: Will not experience complications related to bowel motility Outcome: Progressing   Problem: Pain Management: Goal: General experience of comfort will improve Outcome: Progressing   Problem: Safety: Goal: Ability to remain free from injury will improve Outcome: Progressing   Problem: Skin Integrity: Goal: Risk for impaired skin integrity will decrease Outcome: Progressing   Problem: Education: Goal: Knowledge of disease or  condition will improve Outcome: Progressing Goal: Knowledge of secondary prevention will improve (MUST DOCUMENT ALL) Outcome: Progressing Goal: Knowledge of patient specific risk factors will improve Alonso N/A or DELETE if not current risk factor) Outcome: Progressing   Problem: Ischemic Stroke/TIA Tissue Perfusion: Goal: Complications of ischemic stroke/TIA will be minimized Outcome: Progressing   Problem: Coping: Goal: Will verbalize positive feelings about self Outcome: Progressing Goal: Will identify appropriate support needs Outcome: Progressing   Problem: Health Behavior/Discharge Planning: Goal: Ability to manage health-related needs will improve Outcome: Progressing Goal: Goals will be collaboratively established with patient/family Outcome: Progressing   Problem: Self-Care: Goal: Ability to participate in self-care as condition permits will improve Outcome: Progressing Goal: Verbalization of feelings and concerns over difficulty with self-care will improve Outcome: Progressing Goal: Ability to communicate needs accurately will improve Outcome: Progressing   Problem: Nutrition: Goal: Risk of aspiration will decrease Outcome: Progressing Goal: Dietary intake will improve Outcome: Progressing   Problem: Education: Goal: Knowledge of disease or condition will improve Outcome: Progressing Goal: Knowledge of secondary prevention will improve (MUST DOCUMENT ALL) Outcome: Progressing Goal: Knowledge of patient specific risk factors will improve Alonso N/A or DELETE if not current risk factor) Outcome: Progressing   Problem: Ischemic Stroke/TIA Tissue Perfusion: Goal: Complications of ischemic stroke/TIA will be minimized Outcome: Progressing   Problem: Coping: Goal: Will verbalize positive feelings about self Outcome: Progressing Goal: Will identify appropriate support needs Outcome: Progressing   Problem: Health Behavior/Discharge Planning: Goal: Ability to  manage health-related needs will improve Outcome: Progressing Goal: Goals will be collaboratively established with patient/family Outcome: Progressing   Problem: Self-Care: Goal: Ability to participate in self-care as condition permits will improve Outcome: Progressing Goal: Verbalization of feelings and concerns over difficulty with self-care will improve Outcome: Progressing Goal: Ability to communicate needs accurately will improve Outcome: Progressing   Problem: Nutrition: Goal: Risk of aspiration will decrease Outcome: Progressing Goal: Dietary intake will improve Outcome: Progressing

## 2023-10-07 DIAGNOSIS — E039 Hypothyroidism, unspecified: Secondary | ICD-10-CM | POA: Diagnosis not present

## 2023-10-07 DIAGNOSIS — G35 Multiple sclerosis: Secondary | ICD-10-CM | POA: Diagnosis not present

## 2023-10-07 DIAGNOSIS — Z936 Other artificial openings of urinary tract status: Secondary | ICD-10-CM | POA: Diagnosis not present

## 2023-10-07 DIAGNOSIS — U071 COVID-19: Secondary | ICD-10-CM | POA: Diagnosis not present

## 2023-10-07 MED ORDER — MENTHOL 3 MG MT LOZG
1.0000 | LOZENGE | Freq: Four times a day (QID) | OROMUCOSAL | Status: DC | PRN
  Administered 2023-10-10: 3 mg via ORAL
  Filled 2023-10-07 (×3): qty 9

## 2023-10-07 NOTE — Progress Notes (Signed)
 PROGRESS NOTE    Jordan Ward  FMW:[id number] DOB: [birth year] DOA: 09/29/2023 PCP: Shona Norleen PEDLAR, MD   Brief Narrative:    Jordan Ward, an 83 y/o with multiple co-morbidities including, MS, CKD4, neurogenic bladder with superpubic cystostomy catheter, obstructed left ureter with nephrostomy tube, COPD/Emphysema on home oxygen , hypothyroidism on replacement, dysphagia, non-healing wound right ankle in setting of PAD, HFpEF, cerebral vascular disease. Over the past several days he has been weaker, more short of breath, coughing, left-sided LE weakness, increased slurred speech.  Patient was admitted with COVID-pneumonia as well as significant cerebrovascular disease with small, acute CVA.  He is now noted to have strep pneumonia bacteremia as well.  Assessment & Plan:   Principal Problem:   Pneumonia due to COVID-19 virus Active Problems:   UTI (urinary tract infection)   Cerebrovascular disease   Chronic diastolic (congestive) heart failure (HCC)   CKD (chronic kidney disease) stage 4, GFR 15-29 ml/min (HCC)   Nephrostomy present (HCC)   Emphysema lung (HCC)   Multiple sclerosis (HCC)   Chronic suprapubic catheter (HCC)   Dysphagia   Hypothyroidism   Dilated cardiomyopathy (HCC)   Essential hypertension   COVID-19  Assessment and Plan:   Pneumonia due to COVID-19 virus - remdesivir  infusions has been completed. -Continue treatment with steroids, mucolytic's, flutter valve and bronchodilator management -Will continue supportive care and follow clinical response. -Needs isolation and quarantine through 1/11 until SNF can accept him. -TOC on more and assisting with placement.   Cerebrovascular disease with small ischemic CVA -Complete stroke workup -Follow neurology service recommendation -Prior to admission using aspirin  for secondary prevention -Patient out of the window for any thrombolytic management or intervention. -Will provide treatment with aspirin  and Plavix  for  secondary prevention. -Per PT/OT recommendations patient will need a skilled nursing facility at discharge -LDL 16 -TOC aware and helping with placement.  Hypernatremia-improved -Complete D5 fluid and resume as needed -Monitor repeat labs in a.m.   Strep pneumonia bacteremia -Associated with chronic suprapubic catheter -Continue current IV antibiotics with sensitivity noted to Rocephin  day 6/10   Emphysema lung (HCC) -Continue oxygen  supplementation -Continue bronchodilator management -Receiving steroids as part of treatment for COVID -Follow clinical response and continue supportive care.   Nephrostomy present (HCC) -Continue nephrostomy tube care -Continue current antibiotic for UTI -Continue patient follow-up with urology service.   CKD (chronic kidney disease) stage 4, GFR 15-29 ml/min (HCC) -Continue to follow renal function trend -Continue minimizing nephrotoxic agents and avoid the use of contrast and hypotension. -Good urine output currently appreciated.   Chronic diastolic (congestive) heart failure (HCC) -Elevated BNP was present at time of admission -Some decreased breath sounds reported by admitting doctor patient's lung bases. -Provided on 09/30/2023 -Currently appears to be euvolemic; will hold on any further diuresis for the moment. -Follow daily weights and urine output -Low-sodium diet discussed with patient -Gentle IV fluid initiated as noted above   Essential hypertension -Stable for the most part -Continue to follow vital signs.   Dilated cardiomyopathy (HCC) -2D echocardiogram with LVEF 30-35% with global hypokinesis and grade 1 diastolic dysfunction -No significant signs of fluid overload on exam. -Continue to follow daily weights and strict I's and O's.   Hypothyroidism -TSH elevated -Free T4 1.07 -Continue Synthroid .   Dysphagia -Long standing problem which he attributes to MS. Now with evidence of new CVA -Appreciate assistance and  recommendation by speech therapy; further improvement in his swallowing capacity appreciated; following recommendation by speech therapy (10/02/2023) diet has been  upgraded to dysphagia 3 with thin liquids.   Chronic suprapubic catheter (HCC) -Catheter site appears clean and normal -No erythematous changes or drainage -Continue current IV antibiotics -Continue local care. -Continue patient follow-up with urology service. -Resumed home Myrbetriq    Multiple sclerosis (HCC) -No receiving any medications at the moment -Continue outpatient follow-up with neurology.  DVT prophylaxis:Heparin  Code Status: Full Family Communication: Discussed with wife at bedside 1/4 Disposition Plan:  Status is: Inpatient Remains inpatient appropriate because: Need for placement and IV antibiotics.  Skin Assessment:  I have examined the patient's skin and I agree with the wound assessment as performed by the wound care RN as outlined below:  Pressure Injury 09/30/23 Ankle Left;Lateral Stage 4 - Full thickness tissue loss with exposed bone, tendon or muscle. 1x1cm open area to outside of right ankle, (Active)  09/30/23 1301  Location: Ankle  Location Orientation: Left;Lateral  Staging: Stage 4 - Full thickness tissue loss with exposed bone, tendon or muscle.  Wound Description (Comments): 1x1cm open area to outside of right ankle,  Present on Admission: Yes  Dressing Type Foam - Lift dressing to assess site every shift 10/02/23 2300     Pressure Injury 09/30/23 Buttocks Right;Left Deep Tissue Pressure Injury - Purple or maroon localized area of discolored intact skin or blood-filled blister due to damage of underlying soft tissue from pressure and/or shear. red nonblanchable area to sac (Active)  09/30/23 1130  Location: Buttocks  Location Orientation: Right;Left  Staging: Deep Tissue Pressure Injury - Purple or maroon localized area of discolored intact skin or blood-filled blister due to damage of  underlying soft tissue from pressure and/or shear.  Wound Description (Comments): red nonblanchable area to sacrum and buttocks with purple are in the center, under right buttock a 0.5x0.5 open area  Present on Admission: Yes  Dressing Type Foam - Lift dressing to assess site every shift 10/02/23 2300    Consultants:  None  Procedures:  None  Antimicrobials:  Anti-infectives (From admission, onward)    Start     Dose/Rate Route Frequency Ordered Stop   10/01/23 1600  cefTRIAXone  (ROCEPHIN ) 2 g in sodium chloride  0.9 % 100 mL IVPB        2 g 200 mL/hr over 30 Minutes Intravenous Every 24 hours 09/30/23 1940 10/09/23 2359   09/30/23 2030  cefTRIAXone  (ROCEPHIN ) 1 g in sodium chloride  0.9 % 100 mL IVPB        1 g 200 mL/hr over 30 Minutes Intravenous  Once 09/30/23 1940 10/01/23 1416   09/30/23 1600  cefTRIAXone  (ROCEPHIN ) 1 g in sodium chloride  0.9 % 100 mL IVPB  Status:  Discontinued        1 g 200 mL/hr over 30 Minutes Intravenous Every 24 hours 09/29/23 2318 09/30/23 1940   09/30/23 1000  remdesivir  100 mg in sodium chloride  0.9 % 100 mL IVPB  Status:  Discontinued       Placed in Followed by Linked Group   100 mg 200 mL/hr over 30 Minutes Intravenous Daily 09/29/23 2317 09/29/23 2318   09/30/23 1000  remdesivir  100 mg in sodium chloride  0.9 % 100 mL IVPB       Placed in Followed by Linked Group   100 mg 200 mL/hr over 30 Minutes Intravenous Daily 09/29/23 2319 10/01/23 0902   09/29/23 2348  remdesivir  100 mg in sodium chloride  0.9 % 100 mL IVPB       Placed in Followed by Linked Group   100 mg 200 mL/hr over  30 Minutes Intravenous  Once 09/29/23 2319 09/30/23 0222   09/29/23 2330  remdesivir  100 mg in sodium chloride  0.9 % 100 mL IVPB       Placed in Followed by Linked Group   100 mg 200 mL/hr over 30 Minutes Intravenous  Once 09/29/23 2319 09/30/23 0118   09/29/23 2315  remdesivir  200 mg in sodium chloride  0.9% 250 mL IVPB  Status:  Discontinued       Placed in  Followed by Linked Group   200 mg 580 mL/hr over 30 Minutes Intravenous Once 09/29/23 2317 09/29/23 2318   09/29/23 1515  cefTRIAXone  (ROCEPHIN ) 1 g in sodium chloride  0.9 % 100 mL IVPB        1 g 200 mL/hr over 30 Minutes Intravenous  Once 09/29/23 1506 09/29/23 1627   09/29/23 1515  azithromycin  (ZITHROMAX ) 500 mg in sodium chloride  0.9 % 250 mL IVPB        500 mg 250 mL/hr over 60 Minutes Intravenous  Once 09/29/23 1506 09/29/23 1924      Subjective: Chronically ill in appearance; weak and deconditioned.  No chest pain, no nausea, no vomiting, good saturation on 2 L supplementation and overall demonstrated improvement in his swallowing capacity.  Objective: Vitals:   10/07/23 0914 10/07/23 0918 10/07/23 1248 10/07/23 1625  BP:   100/81 116/67  Pulse:   86   Resp:      Temp:   97.9 F (36.6 C)   TempSrc:   Axillary   SpO2: (!) 85% 94% 97%   Weight:      Height:        Intake/Output Summary (Last 24 hours) at 10/07/2023 1815 Last data filed at 10/07/2023 1658 Gross per 24 hour  Intake 480 ml  Output 1800 ml  Net -1320 ml   Filed Weights   09/29/23 1331  Weight: 50.8 kg    Examination: General exam: Alert, awake, oriented x 3; doing better overall and demonstrated improvement in his swallowing.  Some throat discomfort reported. Respiratory system: Positive scattered rhonchi; no significant expiratory wheezing.  No using accessory muscle.  2 L nasal cannula in place with good oxygen  saturation appreciated. Cardiovascular system: Rate controlled, no rubs, no gallops, no JVD. Gastrointestinal system: Abdomen is nondistended, soft and nontender.  Positive bowel sounds appreciated.  Nephrostomy tube and suprapubic catheter in place without signs of superimposed infection in the area.  No drainage. Central nervous system: Generally weak; no new focal neurological deficits.  Patient with chronic residual left-sided weakness from previous stroke. Extremities: No cyanosis or  clubbing. Skin: No petechiae. Psychiatry: Flat affect appreciated on exam.  Data Reviewed: I have personally reviewed following labs and imaging studies  CBC: Recent Labs  Lab 10/01/23 0435 10/02/23 0339 10/03/23 0402 10/05/23 0458  WBC 5.8 6.2 11.2* 19.2*  HGB 10.8* 11.0* 11.3* 11.2*  HCT 34.2* 33.9* 36.0* 34.5*  MCV 92.2 90.4 91.1 92.2  PLT 142* 158 197 220   Basic Metabolic Panel: Recent Labs  Lab 10/01/23 0435 10/02/23 0339 10/03/23 0402 10/04/23 0340 10/05/23 0458  NA 147* 146* 153* 147* 143  K 3.5 3.4* 4.0 4.8 5.1  CL 119* 120* 123* 118* 118*  CO2 18* 18* 21* 21* 17*  GLUCOSE 178* 193* 154* 175* 153*  BUN 59* 70* 71* 63* 69*  CREATININE 2.51* 2.59* 2.31* 2.04* 2.07*  CALCIUM  8.2* 8.3* 8.4* 8.1* 8.1*  MG  --  2.0 2.2  --  2.2   GFR: Estimated Creatinine Clearance: 20.5 mL/min (A) (  by C-G formula based on SCr of 2.07 mg/dL (H)).  Liver Function Tests: Recent Labs  Lab 10/01/23 0435  AST 28  ALT 20  ALKPHOS 58  BILITOT 0.4  PROT 6.3*  ALBUMIN 2.2*   No results for input(s): LIPASE, AMYLASE in the last 168 hours.  CBG: Recent Labs  Lab 10/03/23 0325  GLUCAP 146*   Sepsis Labs: Recent Labs  Lab 10/01/23 0435  PROCALCITON 6.71    Recent Results (from the past 240 hours)  Resp panel by RT-PCR (RSV, Flu A&B, Covid) Anterior Nasal Swab     Status: Abnormal   Collection Time: 09/29/23 12:13 PM   Specimen: Anterior Nasal Swab  Result Value Ref Range Status   SARS Coronavirus 2 by RT PCR POSITIVE (A) NEGATIVE Final    Comment: (NOTE) SARS-CoV-2 target nucleic acids are DETECTED.  The SARS-CoV-2 RNA is generally detectable in upper respiratory specimens during the acute phase of infection. Positive results are indicative of the presence of the identified virus, but do not rule out bacterial infection or co-infection with other pathogens not detected by the test. Clinical correlation with patient history and other diagnostic information is  necessary to determine patient infection status. The expected result is Negative.  Fact Sheet for Patients: bloggercourse.com  Fact Sheet for Healthcare Providers: seriousbroker.it  This test is not yet approved or cleared by the [place]  FDA and  has been authorized for detection and/or diagnosis of SARS-CoV-2 by FDA under an Emergency Use Authorization (EUA).  This EUA will remain in effect (meaning this test can be used) for the duration of  the COVID-19 declaration under Section 564(b)(1) of the A ct, 21 U.S.C. section 360bbb-3(b)(1), unless the authorization is terminated or revoked sooner.     Influenza A by PCR NEGATIVE NEGATIVE Final   Influenza B by PCR NEGATIVE NEGATIVE Final    Comment: (NOTE) The Xpert Xpress SARS-CoV-2/FLU/RSV plus assay is intended as an aid in the diagnosis of influenza from Nasopharyngeal swab specimens and should not be used as a sole basis for treatment. Nasal washings and aspirates are unacceptable for Xpert Xpress SARS-CoV-2/FLU/RSV testing.  Fact Sheet for Patients: bloggercourse.com  Fact Sheet for Healthcare Providers: seriousbroker.it  This test is not yet approved or cleared by the [place]  FDA and has been authorized for detection and/or diagnosis of SARS-CoV-2 by FDA under an Emergency Use Authorization (EUA). This EUA will remain in effect (meaning this test can be used) for the duration of the COVID-19 declaration under Section 564(b)(1) of the Act, 21 U.S.C. section 360bbb-3(b)(1), unless the authorization is terminated or revoked.     Resp Syncytial Virus by PCR NEGATIVE NEGATIVE Final    Comment: (NOTE) Fact Sheet for Patients: bloggercourse.com  Fact Sheet for Healthcare Providers: seriousbroker.it  This test is not yet approved or cleared by the [place]  FDA  and has been authorized for detection and/or diagnosis of SARS-CoV-2 by FDA under an Emergency Use Authorization (EUA). This EUA will remain in effect (meaning this test can be used) for the duration of the COVID-19 declaration under Section 564(b)(1) of the Act, 21 U.S.C. section 360bbb-3(b)(1), unless the authorization is terminated or revoked.  Performed at [facility], [address], [place], [place] [address]   Culture, blood (Routine x 2)     Status: Abnormal   Collection Time: 09/29/23  2:11 PM   Specimen: BLOOD RIGHT FOREARM  Result Value Ref Range Status   Specimen Description   Final    BLOOD  RIGHT FOREARM Performed at [facility], [address] [place], [place] [address]    Special Requests   Final    Blood Culture adequate volume BOTTLES DRAWN AEROBIC AND ANAEROBIC Performed at [facility], [address], [place], [place] [address]    Culture  Setup Time   Final    GRAM POSITIVE COCCI IN BOTH AEROBIC AND ANAEROBIC BOTTLES Gram Stain Report Called to,Read Back By and Verified With: CHARMAINE BREEN [phone], COLLIN JINNY SETTLE STAIN REVIEWED-AGREE WITH RESULT DRT CRITICAL RESULT CALLED TO, READ BACK BY AND VERIFIED WITH: RN JENNIFER HELBERG ON 09/30/23 @ 1909 BY DRT Performed at [facility], [address] [place], [place] [address]    Culture STREPTOCOCCUS PNEUMONIAE (A)  Final   Report Status 10/02/2023 FINAL  Final   Organism ID, Bacteria STREPTOCOCCUS PNEUMONIAE  Final      Susceptibility   Streptococcus pneumoniae - MIC*    ERYTHROMYCIN <=0.12 SENSITIVE Sensitive     LEVOFLOXACIN  0.5 SENSITIVE Sensitive     VANCOMYCIN  0.5 SENSITIVE Sensitive     PENICILLIN (meningitis) <=0.06 SENSITIVE Sensitive     PENO - penicillin <=0.06      PENICILLIN (non-meningitis) <=0.06 SENSITIVE Sensitive     PENICILLIN (oral) <=0.06 SENSITIVE Sensitive     CEFTRIAXONE  (non-meningitis) <=0.12 SENSITIVE Sensitive     CEFTRIAXONE  (meningitis) <=0.12 SENSITIVE Sensitive      * STREPTOCOCCUS PNEUMONIAE  Culture, blood (Routine x 2)     Status: Abnormal   Collection Time: 09/29/23  2:11 PM   Specimen: BLOOD LEFT HAND  Result Value Ref Range Status   Specimen Description   Final    BLOOD LEFT HAND Performed at [facility], [address], [place], [place] [address]    Special Requests   Final    BOTTLES DRAWN AEROBIC AND ANAEROBIC Blood Culture adequate volume Performed at [facility], [address], [place], [place] [address]    Culture  Setup Time   Final    GRAM POSITIVE COCCI IN BOTH AEROBIC AND ANAEROBIC BOTTLES Gram Stain Report Called to,Read Back By and Verified With: CHARMAINE BREEN [phone], COLLIN JINNY SETTLE STAIN REVIEWED-AGREE WITH RESULT DRT    Culture (A)  Final    STREPTOCOCCUS PNEUMONIAE SUSCEPTIBILITIES PERFORMED ON PREVIOUS CULTURE WITHIN THE LAST 5 DAYS. STAPHYLOCOCCUS CAPITIS THE SIGNIFICANCE OF ISOLATING THIS ORGANISM FROM A SINGLE SET OF BLOOD CULTURES WHEN MULTIPLE SETS ARE DRAWN IS UNCERTAIN. PLEASE NOTIFY THE MICROBIOLOGY DEPARTMENT WITHIN ONE WEEK IF SPECIATION AND SENSITIVITIES ARE REQUIRED. Performed at [facility], [address] [place], [place] [address]    Report Status 10/02/2023 FINAL  Final  Blood Culture ID Panel (Reflexed)     Status: Abnormal   Collection Time: 09/29/23  2:11 PM  Result Value Ref Range Status   Enterococcus faecalis NOT DETECTED NOT DETECTED Final   Enterococcus Faecium NOT DETECTED NOT DETECTED Final   Listeria monocytogenes NOT DETECTED NOT DETECTED Final   Staphylococcus species NOT DETECTED NOT DETECTED Final   Staphylococcus aureus (BCID) NOT DETECTED NOT DETECTED Final   Staphylococcus epidermidis NOT DETECTED NOT DETECTED Final   Staphylococcus lugdunensis NOT DETECTED NOT DETECTED Final   Streptococcus species DETECTED (A) NOT DETECTED Final    Comment: CRITICAL RESULT CALLED TO, READ BACK BY AND VERIFIED WITH: RN JENNIFER HELBERG ON 09/30/23 @ 1909 BY DRT     Streptococcus agalactiae NOT DETECTED NOT DETECTED Final   Streptococcus pneumoniae DETECTED (A) NOT DETECTED Final  Comment: CRITICAL RESULT CALLED TO, READ BACK BY AND VERIFIED WITH: RN JENNIFER HELBERG ON 09/30/23 @ 1909 BY DRT    Streptococcus pyogenes NOT DETECTED NOT DETECTED Final   A.calcoaceticus-baumannii NOT DETECTED NOT DETECTED Final   Bacteroides fragilis NOT DETECTED NOT DETECTED Final   Enterobacterales NOT DETECTED NOT DETECTED Final   Enterobacter cloacae complex NOT DETECTED NOT DETECTED Final   Escherichia coli NOT DETECTED NOT DETECTED Final   Klebsiella aerogenes NOT DETECTED NOT DETECTED Final   Klebsiella oxytoca NOT DETECTED NOT DETECTED Final   Klebsiella pneumoniae NOT DETECTED NOT DETECTED Final   Proteus species NOT DETECTED NOT DETECTED Final   Salmonella species NOT DETECTED NOT DETECTED Final   Serratia marcescens NOT DETECTED NOT DETECTED Final   Haemophilus influenzae NOT DETECTED NOT DETECTED Final   Neisseria meningitidis NOT DETECTED NOT DETECTED Final   Pseudomonas aeruginosa NOT DETECTED NOT DETECTED Final   Stenotrophomonas maltophilia NOT DETECTED NOT DETECTED Final   Candida albicans NOT DETECTED NOT DETECTED Final   Candida auris NOT DETECTED NOT DETECTED Final   Candida glabrata NOT DETECTED NOT DETECTED Final   Candida krusei NOT DETECTED NOT DETECTED Final   Candida parapsilosis NOT DETECTED NOT DETECTED Final   Candida tropicalis NOT DETECTED NOT DETECTED Final   Cryptococcus neoformans/gattii NOT DETECTED NOT DETECTED Final    Comment: Performed at [facility], [address] [place], [place] [address]  Culture, blood (Routine X 2) w Reflex to ID Panel     Status: None   Collection Time: 10/01/23  4:35 AM   Specimen: BLOOD  Result Value Ref Range Status   Specimen Description BLOOD LEFT WRIST  Final   Special Requests   Final    BOTTLES DRAWN AEROBIC AND ANAEROBIC Blood Culture adequate volume   Culture   Final    NO GROWTH  5 DAYS Performed at [facility], [address], [place], [place] [address]    Report Status 10/06/2023 FINAL  Final  Culture, blood (Routine X 2) w Reflex to ID Panel     Status: None   Collection Time: 10/01/23  4:35 AM   Specimen: BLOOD  Result Value Ref Range Status   Specimen Description BLOOD LEFT HAND  Final   Special Requests   Final    BOTTLES DRAWN AEROBIC AND ANAEROBIC Blood Culture adequate volume   Culture   Final    NO GROWTH 5 DAYS Performed at [facility], [address] [place], [place] [address]    Report Status 10/06/2023 FINAL  Final    Radiology Studies: No results found.  Scheduled Meds:  amLODipine   5 mg Oral QPM   aspirin   325 mg Oral Daily   budesonide  (PULMICORT ) nebulizer solution  0.5 mg Nebulization BID   clopidogrel   75 mg Oral Daily   diazepam   5 mg Oral BID   heparin   5,000 Units Subcutaneous Q8H   HYDROcodone -acetaminophen   1 tablet Oral BID   ipratropium-albuterol   3 mL Nebulization BID   levothyroxine   75 mcg Oral q AM   mirabegron  ER  50 mg Oral Daily   multivitamin with minerals  1 tablet Oral Daily   pantoprazole   40 mg Oral Daily   thiamine   100 mg Oral Daily   Continuous Infusions:  cefTRIAXone  (ROCEPHIN )  IV 2 g (10/07/23 1622)     LOS: 8 days    Time spent: 35 minutes    Carthel Castille,MD [facility]  If 7PM-7AM, please contact night-coverage [email or web address] 10/07/2023,  6:15 PM

## 2023-10-07 NOTE — Plan of Care (Signed)
  Problem: Education: Goal: Knowledge of risk factors and measures for prevention of condition will improve Outcome: Progressing   Problem: Coping: Goal: Psychosocial and spiritual needs will be supported Outcome: Progressing   Problem: Respiratory: Goal: Will maintain a patent airway Outcome: Progressing Goal: Complications related to the disease process, condition or treatment will be avoided or minimized Outcome: Progressing   Problem: Education: Goal: Knowledge of General Education information will improve Description: Including pain rating scale, medication(s)/side effects and non-pharmacologic comfort measures Outcome: Progressing   Problem: Health Behavior/Discharge Planning: Goal: Ability to manage health-related needs will improve Outcome: Progressing   Problem: Clinical Measurements: Goal: Ability to maintain clinical measurements within normal limits will improve Outcome: Progressing Goal: Will remain free from infection Outcome: Progressing Goal: Diagnostic test results will improve Outcome: Progressing Goal: Respiratory complications will improve Outcome: Progressing Goal: Cardiovascular complication will be avoided Outcome: Progressing   Problem: Activity: Goal: Risk for activity intolerance will decrease Outcome: Progressing   Problem: Nutrition: Goal: Adequate nutrition will be maintained Outcome: Progressing   Problem: Coping: Goal: Level of anxiety will decrease Outcome: Progressing   Problem: Elimination: Goal: Will not experience complications related to bowel motility Outcome: Progressing   Problem: Pain Management: Goal: General experience of comfort will improve Outcome: Progressing   Problem: Safety: Goal: Ability to remain free from injury will improve Outcome: Progressing   Problem: Skin Integrity: Goal: Risk for impaired skin integrity will decrease Outcome: Progressing   Problem: Education: Goal: Knowledge of disease or  condition will improve Outcome: Progressing Goal: Knowledge of secondary prevention will improve (MUST DOCUMENT ALL) Outcome: Progressing Goal: Knowledge of patient specific risk factors will improve Alonso N/A or DELETE if not current risk factor) Outcome: Progressing   Problem: Ischemic Stroke/TIA Tissue Perfusion: Goal: Complications of ischemic stroke/TIA will be minimized Outcome: Progressing   Problem: Coping: Goal: Will verbalize positive feelings about self Outcome: Progressing Goal: Will identify appropriate support needs Outcome: Progressing   Problem: Health Behavior/Discharge Planning: Goal: Ability to manage health-related needs will improve Outcome: Progressing Goal: Goals will be collaboratively established with patient/family Outcome: Progressing   Problem: Self-Care: Goal: Ability to participate in self-care as condition permits will improve Outcome: Progressing Goal: Verbalization of feelings and concerns over difficulty with self-care will improve Outcome: Progressing Goal: Ability to communicate needs accurately will improve Outcome: Progressing   Problem: Nutrition: Goal: Risk of aspiration will decrease Outcome: Progressing Goal: Dietary intake will improve Outcome: Progressing   Problem: Education: Goal: Knowledge of disease or condition will improve Outcome: Progressing Goal: Knowledge of secondary prevention will improve (MUST DOCUMENT ALL) Outcome: Progressing Goal: Knowledge of patient specific risk factors will improve Alonso N/A or DELETE if not current risk factor) Outcome: Progressing   Problem: Ischemic Stroke/TIA Tissue Perfusion: Goal: Complications of ischemic stroke/TIA will be minimized Outcome: Progressing   Problem: Coping: Goal: Will verbalize positive feelings about self Outcome: Progressing Goal: Will identify appropriate support needs Outcome: Progressing   Problem: Health Behavior/Discharge Planning: Goal: Ability to  manage health-related needs will improve Outcome: Progressing Goal: Goals will be collaboratively established with patient/family Outcome: Progressing   Problem: Self-Care: Goal: Ability to participate in self-care as condition permits will improve Outcome: Progressing Goal: Verbalization of feelings and concerns over difficulty with self-care will improve Outcome: Progressing Goal: Ability to communicate needs accurately will improve Outcome: Progressing   Problem: Nutrition: Goal: Risk of aspiration will decrease Outcome: Progressing Goal: Dietary intake will improve Outcome: Progressing

## 2023-10-08 DIAGNOSIS — U071 COVID-19: Secondary | ICD-10-CM | POA: Diagnosis not present

## 2023-10-08 DIAGNOSIS — J1282 Pneumonia due to coronavirus disease 2019: Secondary | ICD-10-CM | POA: Diagnosis not present

## 2023-10-08 LAB — BASIC METABOLIC PANEL
Anion gap: 12 (ref 5–15)
BUN: 66 mg/dL — ABNORMAL HIGH (ref 8–23)
CO2: 18 mmol/L — ABNORMAL LOW (ref 22–32)
Calcium: 8 mg/dL — ABNORMAL LOW (ref 8.9–10.3)
Chloride: 112 mmol/L — ABNORMAL HIGH (ref 98–111)
Creatinine, Ser: 1.93 mg/dL — ABNORMAL HIGH (ref 0.61–1.24)
GFR, Estimated: 35 mL/min — ABNORMAL LOW (ref >60)
Glucose, Bld: 96 mg/dL (ref 70–99)
Potassium: 4.8 mmol/L (ref 3.5–5.1)
Sodium: 142 mmol/L (ref 135–145)

## 2023-10-08 LAB — CBC
HCT: 41.2 % (ref 39.0–52.0)
Hemoglobin: 12.5 g/dL — ABNORMAL LOW (ref 13.0–17.0)
MCH: 29.7 pg (ref 26.0–34.0)
MCHC: 30.3 g/dL (ref 30.0–36.0)
MCV: 97.9 fL (ref 80.0–100.0)
Platelets: 244 10*3/uL (ref 150–400)
RBC: 4.21 MIL/uL — ABNORMAL LOW (ref 4.22–5.81)
RDW: 17.1 % — ABNORMAL HIGH (ref 11.5–15.5)
WBC: 27.4 10*3/uL — ABNORMAL HIGH (ref 4.0–10.5)
nRBC: 0 % (ref 0.0–0.2)

## 2023-10-08 LAB — PROCALCITONIN: Procalcitonin: 0.23 ng/mL

## 2023-10-08 LAB — MAGNESIUM: Magnesium: 2.5 mg/dL — ABNORMAL HIGH (ref 1.7–2.4)

## 2023-10-08 MED ORDER — ENSURE ENLIVE PO LIQD
237.0000 mL | Freq: Two times a day (BID) | ORAL | Status: DC
  Administered 2023-10-08 – 2023-10-11 (×7): 237 mL via ORAL

## 2023-10-08 MED ORDER — PHENOL 1.4 % MT LIQD: 1.0000 | OROMUCOSAL | Status: DC | PRN

## 2023-10-08 NOTE — Progress Notes (Signed)
 Nutrition Follow-up  DOCUMENTATION CODES:   Not applicable  INTERVENTION:   Ensure Plus High Protein po BID, each supplement provides 350 kcal and 20 grams of protein. Continue Magic cup TID with meals, each supplement provides 290 kcal and 9 grams of protein. Continue MVI with minerals daily.  NUTRITION DIAGNOSIS:   Increased nutrient needs related to wound healing as evidenced by estimated needs.  Ongoing   GOAL:   Patient will meet greater than or equal to 90% of their needs  Progressing   MONITOR:   PO intake, Supplement acceptance, Skin  REASON FOR ASSESSMENT:   Malnutrition Screening Tool    ASSESSMENT:   83 yo male admitted with PNA d/t COVID-19 virus, UTI. PMH includes multiple sclerosis, arthritis, PVD, TIA, pulmonary fibrosis, on home oxygen , dysphagia, hypothroidism, HTN, chronic back pain, HLD, CAD, CVD, stroke, neurogenic bladder.  SLP advanced patient's diet to dysphagia 3 with thin liquids on 1/3 with vigilant oral care to decrease bacteria load, given hx of aspiration. Intake of meals remains suboptimal. Meal intakes documented at 10-75% with average meal intake of 39% for the past 8 meals documented. Patient states that he is having trouble swallowing, but wants to continue his current diet with thin liquids and mechanical soft foods. He is receiving magic cups with meals to maximize protein and calorie intake. He says that he likes them and has been eating them. He also agreed to add Ensure supplements BID between meals; he prefers vanilla.   Labs reviewed.  Medications reviewed and include MVI with minerals, protonix , thiamine , IV rocephin .  No new weight available since admission.  Diet Order:   Diet Order             DIET DYS 3 Room service appropriate? Yes; Fluid consistency: Thin  Diet effective now                   EDUCATION NEEDS:   Not appropriate for education at this time  Skin:  Skin Assessment: Skin Integrity Issues: Skin  Integrity Issues:: Stage IV, DTI DTI: bilateral buttocks Stage IV: L ankle  Last BM:  1/8  Height:   Ht Readings from Last 1 Encounters:  09/29/23 5' 5 (1.651 m)    Weight:   Wt Readings from Last 1 Encounters:  09/29/23 50.8 kg    BMI:  Body mass index is 18.64 kg/m.  Estimated Nutritional Needs:   Kcal:  1600-1800  Protein:  80-90 gm  Fluid:  1.6-1.8 L   Suzen HUNT RD, LDN, CNSC Contact Inpatient RD using Secure Chat. If unavailable, use group chat RD Inpatient via Secure Chat in EPIC.

## 2023-10-08 NOTE — Consult Note (Signed)
 [facility] Liaison Consult Note    10/08/2023  MATAIO MELE Jan 14, 1941 [id number]  Insurance: Medicare ACO Reach   Primary Care Provider: Shona Norleen PEDLAR, MD this provider is listed for the transition of care follow up appointments  and [facility] calls   [facility] Liaison screened the patient remotely at [facility]. Coverage review for [facility], [facility] Liaison    The patient was screened for LLOS 8 day day hospitalization with noted rising to high risk score for unplanned readmission risk 1 hospital admissions in 6 months.  The patient was assessed for potential Community Care Coordination service needs for post hospital transition for care coordination. Review of patient's electronic medical record reveals patient is being recommended for SNF.  Patient admitted with COVID and SNF bed not available until 10/12/23 per inpatient TOC notes due to COVID quarantine ends on the weekend.  Plan: [facility] Liaison will continue to follow progress and disposition to asess for post hospital community care coordination/management needs.  Referral request for community care coordination: will update [facility] Liaison of findings and disposition.  If patient goes to a [facility] affiliated facility can request [facility] RN to follow up for returning to community needs.   [facility] Community Care, Population Health does not replace or interfere with any arrangements made by the Inpatient Transition of Care team.   For questions contact:   Richerd Fish, RN, BSN, CCM [facility] Liaison Direct Dial: [phone] or secure chat Email: [email or web address]

## 2023-10-08 NOTE — Plan of Care (Signed)
  Problem: Education: Goal: Knowledge of risk factors and measures for prevention of condition will improve Outcome: Progressing   Problem: Coping: Goal: Psychosocial and spiritual needs will be supported Outcome: Progressing   Problem: Clinical Measurements: Goal: Ability to maintain clinical measurements within normal limits will improve Outcome: Progressing Goal: Will remain free from infection Outcome: Progressing Goal: Diagnostic test results will improve Outcome: Progressing Goal: Respiratory complications will improve Outcome: Progressing

## 2023-10-08 NOTE — Progress Notes (Signed)
 PROGRESS NOTE    Jordan Ward  FMW:[id number] DOB: May 27, 1941 DOA: 09/29/2023 PCP: Shona Norleen PEDLAR, MD   Brief Narrative:    Jordan Ward, an 83 y/o with multiple co-morbidities including, MS, CKD4, neurogenic bladder with superpubic cystostomy catheter, obstructed left ureter with nephrostomy tube, COPD/Emphysema on home oxygen , hypothyroidism on replacement, dysphagia, non-healing wound right ankle in setting of PAD, HFpEF, cerebral vascular disease. Over the past several days he has been weaker, more short of breath, coughing, left-sided LE weakness, increased slurred speech.  Patient was admitted with COVID-pneumonia as well as significant cerebrovascular disease with small, acute CVA.  He is now noted to have strep pneumonia bacteremia as well.  Now awaiting SNF placement by 1/13.  Assessment & Plan:   Principal Problem:   Pneumonia due to COVID-19 virus Active Problems:   UTI (urinary tract infection)   Cerebrovascular disease   Chronic diastolic (congestive) heart failure (HCC)   CKD (chronic kidney disease) stage 4, GFR 15-29 ml/min (HCC)   Nephrostomy present (HCC)   Emphysema lung (HCC)   Multiple sclerosis (HCC)   Chronic suprapubic catheter (HCC)   Dysphagia   Hypothyroidism   Dilated cardiomyopathy (HCC)   Essential hypertension   COVID-19  Assessment and Plan:   Pneumonia due to COVID-19 virus - remdesivir  infusions has been completed. -Continue treatment with steroids, mucolytic's, flutter valve and bronchodilator management -Will continue supportive care and follow clinical response. -Needs isolation and quarantine through 1/13 until SNF can accept him. -TOC on more and assisting with placement. -Increase leukocytosis, monitor CBC with differential in a.m.   Cerebrovascular disease with small ischemic CVA -Complete stroke workup -Follow neurology service recommendation -Prior to admission using aspirin  for secondary prevention -Patient out of the window for any  thrombolytic management or intervention. -Will provide treatment with aspirin  and Plavix  for secondary prevention. -Per PT/OT recommendations patient will need a skilled nursing facility at discharge -LDL 16 -TOC aware and helping with placement.  Hypernatremia-improved -Complete D5 fluid and resume as needed -Monitor repeat labs in a.m.   Strep pneumonia bacteremia -Associated with chronic suprapubic catheter -Continue current IV antibiotics with sensitivity noted to Rocephin  day 9/10   Emphysema lung (HCC) -Continue oxygen  supplementation -Continue bronchodilator management -Receiving steroids as part of treatment for COVID -Follow clinical response and continue supportive care.   Nephrostomy present (HCC) -Continue nephrostomy tube care -Continue current antibiotic for UTI -Continue patient follow-up with urology service.   CKD (chronic kidney disease) stage 4, GFR 15-29 ml/min (HCC) -Continue to follow renal function trend -Continue minimizing nephrotoxic agents and avoid the use of contrast and hypotension. -Good urine output currently appreciated.   Chronic diastolic (congestive) heart failure (HCC) -Elevated BNP was present at time of admission -Some decreased breath sounds reported by admitting doctor patient's lung bases. -Provided on 09/30/2023 -Currently appears to be euvolemic; will hold on any further diuresis for the moment. -Follow daily weights and urine output -Low-sodium diet discussed with patient -Gentle IV fluid initiated as noted above   Essential hypertension -Stable for the most part -Continue to follow vital signs.   Dilated cardiomyopathy (HCC) -2D echocardiogram with LVEF 30-35% with global hypokinesis and grade 1 diastolic dysfunction -No significant signs of fluid overload on exam. -Continue to follow daily weights and strict I's and O's.   Hypothyroidism -TSH elevated -Free T4 1.07 -Continue Synthroid .   Dysphagia -Long standing  problem which he attributes to MS. Now with evidence of new CVA -Appreciate assistance and recommendation by speech therapy; further  improvement in his swallowing capacity appreciated; following recommendation by speech therapy (10/02/2023) diet has been upgraded to dysphagia 3 with thin liquids.   Chronic suprapubic catheter (HCC) -Catheter site appears clean and normal -No erythematous changes or drainage -Continue current IV antibiotics -Continue local care. -Continue patient follow-up with urology service. -Resumed home Myrbetriq    Multiple sclerosis (HCC) -No receiving any medications at the moment -Continue outpatient follow-up with neurology.  DVT prophylaxis:Heparin  Code Status: Full Family Communication: Discussed with wife at bedside 1/4 Disposition Plan:  Status is: Inpatient Remains inpatient appropriate because: Need for placement and IV antibiotics.  Skin Assessment:  I have examined the patient's skin and I agree with the wound assessment as performed by the wound care RN as outlined below:  Pressure Injury 09/30/23 Ankle Left;Lateral Stage 4 - Full thickness tissue loss with exposed bone, tendon or muscle. 1x1cm open area to outside of right ankle, (Active)  09/30/23 1301  Location: Ankle  Location Orientation: Left;Lateral  Staging: Stage 4 - Full thickness tissue loss with exposed bone, tendon or muscle.  Wound Description (Comments): 1x1cm open area to outside of right ankle,  Present on Admission: Yes  Dressing Type Foam - Lift dressing to assess site every shift 10/08/23 0833     Pressure Injury 09/30/23 Buttocks Right;Left Deep Tissue Pressure Injury - Purple or maroon localized area of discolored intact skin or blood-filled blister due to damage of underlying soft tissue from pressure and/or shear. red nonblanchable area to sac (Active)  09/30/23 1130  Location: Buttocks  Location Orientation: Right;Left  Staging: Deep Tissue Pressure Injury - Purple or  maroon localized area of discolored intact skin or blood-filled blister due to damage of underlying soft tissue from pressure and/or shear.  Wound Description (Comments): red nonblanchable area to sacrum and buttocks with purple are in the center, under right buttock a 0.5x0.5 open area  Present on Admission: Yes  Dressing Type Foam - Lift dressing to assess site every shift 10/08/23 0833    Consultants:  None  Procedures:  None  Antimicrobials:  Anti-infectives (From admission, onward)    Start     Dose/Rate Route Frequency Ordered Stop   10/01/23 1600  cefTRIAXone  (ROCEPHIN ) 2 g in sodium chloride  0.9 % 100 mL IVPB        2 g 200 mL/hr over 30 Minutes Intravenous Every 24 hours 09/30/23 1940 10/09/23 2359   09/30/23 2030  cefTRIAXone  (ROCEPHIN ) 1 g in sodium chloride  0.9 % 100 mL IVPB        1 g 200 mL/hr over 30 Minutes Intravenous  Once 09/30/23 1940 10/01/23 1416   09/30/23 1600  cefTRIAXone  (ROCEPHIN ) 1 g in sodium chloride  0.9 % 100 mL IVPB  Status:  Discontinued        1 g 200 mL/hr over 30 Minutes Intravenous Every 24 hours 09/29/23 2318 09/30/23 1940   09/30/23 1000  remdesivir  100 mg in sodium chloride  0.9 % 100 mL IVPB  Status:  Discontinued       Placed in Followed by Linked Group   100 mg 200 mL/hr over 30 Minutes Intravenous Daily 09/29/23 2317 09/29/23 2318   09/30/23 1000  remdesivir  100 mg in sodium chloride  0.9 % 100 mL IVPB       Placed in Followed by Linked Group   100 mg 200 mL/hr over 30 Minutes Intravenous Daily 09/29/23 2319 10/01/23 0902   09/29/23 2348  remdesivir  100 mg in sodium chloride  0.9 % 100 mL IVPB  Placed in Followed by Linked Group   100 mg 200 mL/hr over 30 Minutes Intravenous  Once 09/29/23 2319 09/30/23 0222   09/29/23 2330  remdesivir  100 mg in sodium chloride  0.9 % 100 mL IVPB       Placed in Followed by Linked Group   100 mg 200 mL/hr over 30 Minutes Intravenous  Once 09/29/23 2319 09/30/23 0118   09/29/23 2315   remdesivir  200 mg in sodium chloride  0.9% 250 mL IVPB  Status:  Discontinued       Placed in Followed by Linked Group   200 mg 580 mL/hr over 30 Minutes Intravenous Once 09/29/23 2317 09/29/23 2318   09/29/23 1515  cefTRIAXone  (ROCEPHIN ) 1 g in sodium chloride  0.9 % 100 mL IVPB        1 g 200 mL/hr over 30 Minutes Intravenous  Once 09/29/23 1506 09/29/23 1627   09/29/23 1515  azithromycin  (ZITHROMAX ) 500 mg in sodium chloride  0.9 % 250 mL IVPB        500 mg 250 mL/hr over 60 Minutes Intravenous  Once 09/29/23 1506 09/29/23 1924      Subjective: Patient seen and evaluated this a.m. with no new complaints or concerns noted.  He is complaining of some ongoing sore throat.  Objective: Vitals:   10/07/23 2140 10/08/23 0501 10/08/23 0833 10/08/23 0836  BP: 115/74 (!) 140/72    Pulse: 80 76    Resp: (!) 24 16    Temp: 98 F (36.7 C) 98.9 F (37.2 C)    TempSrc: Oral     SpO2: 96% 93% 90% 94%  Weight:      Height:        Intake/Output Summary (Last 24 hours) at 10/08/2023 1325 Last data filed at 10/08/2023 1311 Gross per 24 hour  Intake 960 ml  Output 2175 ml  Net -1215 ml   Filed Weights   09/29/23 1331  Weight: 50.8 kg    Examination: General exam: Alert, awake, oriented x 3; doing better overall and demonstrated improvement in his swallowing.  Some throat discomfort reported. Respiratory system: Positive scattered rhonchi; no significant expiratory wheezing.  No using accessory muscle.  2 L nasal cannula in place with good oxygen  saturation appreciated. Cardiovascular system: Rate controlled, no rubs, no gallops, no JVD. Gastrointestinal system: Abdomen is nondistended, soft and nontender.  Positive bowel sounds appreciated.  Nephrostomy tube and suprapubic catheter in place without signs of superimposed infection in the area.  No drainage. Central nervous system: Generally weak; no new focal neurological deficits.  Patient with chronic residual left-sided weakness from  previous stroke. Extremities: No cyanosis or clubbing. Skin: No petechiae. Psychiatry: Flat affect appreciated on exam.  Data Reviewed: I have personally reviewed following labs and imaging studies  CBC: Recent Labs  Lab 10/02/23 0339 10/03/23 0402 10/05/23 0458 10/08/23 0749  WBC 6.2 11.2* 19.2* 27.4*  HGB 11.0* 11.3* 11.2* 12.5*  HCT 33.9* 36.0* 34.5* 41.2  MCV 90.4 91.1 92.2 97.9  PLT 158 197 220 244   Basic Metabolic Panel: Recent Labs  Lab 10/02/23 0339 10/03/23 0402 10/04/23 0340 10/05/23 0458 10/08/23 0749  NA 146* 153* 147* 143 142  K 3.4* 4.0 4.8 5.1 4.8  CL 120* 123* 118* 118* 112*  CO2 18* 21* 21* 17* 18*  GLUCOSE 193* 154* 175* 153* 96  BUN 70* 71* 63* 69* 66*  CREATININE 2.59* 2.31* 2.04* 2.07* 1.93*  CALCIUM  8.3* 8.4* 8.1* 8.1* 8.0*  MG 2.0 2.2  --  2.2 2.5*  GFR: Estimated Creatinine Clearance: 21.9 mL/min (A) (by C-G formula based on SCr of 1.93 mg/dL (H)).  Liver Function Tests: No results for input(s): AST, ALT, ALKPHOS, BILITOT, PROT, ALBUMIN in the last 168 hours.  No results for input(s): LIPASE, AMYLASE in the last 168 hours.  CBG: Recent Labs  Lab 10/03/23 0325  GLUCAP 146*   Sepsis Labs: Recent Labs  Lab 10/08/23 0749  PROCALCITON 0.23    Recent Results (from the past 240 hours)  Resp panel by RT-PCR (RSV, Flu A&B, Covid) Anterior Nasal Swab     Status: Abnormal   Collection Time: 09/29/23 12:13 PM   Specimen: Anterior Nasal Swab  Result Value Ref Range Status   SARS Coronavirus 2 by RT PCR POSITIVE (A) NEGATIVE Final    Comment: (NOTE) SARS-CoV-2 target nucleic acids are DETECTED.  The SARS-CoV-2 RNA is generally detectable in upper respiratory specimens during the acute phase of infection. Positive results are indicative of the presence of the identified virus, but do not rule out bacterial infection or co-infection with other pathogens not detected by the test. Clinical correlation with patient history  and other diagnostic information is necessary to determine patient infection status. The expected result is Negative.  Fact Sheet for Patients: bloggercourse.com  Fact Sheet for Healthcare Providers: seriousbroker.it  This test is not yet approved or cleared by the [place]  FDA and  has been authorized for detection and/or diagnosis of SARS-CoV-2 by FDA under an Emergency Use Authorization (EUA).  This EUA will remain in effect (meaning this test can be used) for the duration of  the COVID-19 declaration under Section 564(b)(1) of the A ct, 21 U.S.C. section 360bbb-3(b)(1), unless the authorization is terminated or revoked sooner.     Influenza A by PCR NEGATIVE NEGATIVE Final   Influenza B by PCR NEGATIVE NEGATIVE Final    Comment: (NOTE) The Xpert Xpress SARS-CoV-2/FLU/RSV plus assay is intended as an aid in the diagnosis of influenza from Nasopharyngeal swab specimens and should not be used as a sole basis for treatment. Nasal washings and aspirates are unacceptable for Xpert Xpress SARS-CoV-2/FLU/RSV testing.  Fact Sheet for Patients: bloggercourse.com  Fact Sheet for Healthcare Providers: seriousbroker.it  This test is not yet approved or cleared by the [place]  FDA and has been authorized for detection and/or diagnosis of SARS-CoV-2 by FDA under an Emergency Use Authorization (EUA). This EUA will remain in effect (meaning this test can be used) for the duration of the COVID-19 declaration under Section 564(b)(1) of the Act, 21 U.S.C. section 360bbb-3(b)(1), unless the authorization is terminated or revoked.     Resp Syncytial Virus by PCR NEGATIVE NEGATIVE Final    Comment: (NOTE) Fact Sheet for Patients: bloggercourse.com  Fact Sheet for Healthcare Providers: seriousbroker.it  This test is not yet  approved or cleared by the [place]  FDA and has been authorized for detection and/or diagnosis of SARS-CoV-2 by FDA under an Emergency Use Authorization (EUA). This EUA will remain in effect (meaning this test can be used) for the duration of the COVID-19 declaration under Section 564(b)(1) of the Act, 21 U.S.C. section 360bbb-3(b)(1), unless the authorization is terminated or revoked.  Performed at [facility], [address], [place], [place] [address]   Culture, blood (Routine x 2)     Status: Abnormal   Collection Time: 09/29/23  2:11 PM   Specimen: BLOOD RIGHT FOREARM  Result Value Ref Range Status   Specimen Description   Final    BLOOD RIGHT FOREARM Performed  at [facility], [address], [place], [place] [address]    Special Requests   Final    Blood Culture adequate volume BOTTLES DRAWN AEROBIC AND ANAEROBIC Performed at [facility], [address] [place], [place] [address]    Culture  Setup Time   Final    GRAM POSITIVE COCCI IN BOTH AEROBIC AND ANAEROBIC BOTTLES Gram Stain Report Called to,Read Back By and Verified With: CHARMAINE BREEN [phone], COLLIN JINNY SETTLE STAIN REVIEWED-AGREE WITH RESULT DRT CRITICAL RESULT CALLED TO, READ BACK BY AND VERIFIED WITH: RN JENNIFER HELBERG ON 09/30/23 @ 1909 BY DRT Performed at [facility], [address], [place], [place] [address]    Culture STREPTOCOCCUS PNEUMONIAE (A)  Final   Report Status 10/02/2023 FINAL  Final   Organism ID, Bacteria STREPTOCOCCUS PNEUMONIAE  Final      Susceptibility   Streptococcus pneumoniae - MIC*    ERYTHROMYCIN <=0.12 SENSITIVE Sensitive     LEVOFLOXACIN  0.5 SENSITIVE Sensitive     VANCOMYCIN  0.5 SENSITIVE Sensitive     PENICILLIN (meningitis) <=0.06 SENSITIVE Sensitive     PENO - penicillin <=0.06      PENICILLIN (non-meningitis) <=0.06 SENSITIVE Sensitive     PENICILLIN (oral) <=0.06 SENSITIVE Sensitive     CEFTRIAXONE  (non-meningitis) <=0.12 SENSITIVE Sensitive      CEFTRIAXONE  (meningitis) <=0.12 SENSITIVE Sensitive     * STREPTOCOCCUS PNEUMONIAE  Culture, blood (Routine x 2)     Status: Abnormal   Collection Time: 09/29/23  2:11 PM   Specimen: BLOOD LEFT HAND  Result Value Ref Range Status   Specimen Description   Final    BLOOD LEFT HAND Performed at [facility], [address] [place], [place] [address]    Special Requests   Final    BOTTLES DRAWN AEROBIC AND ANAEROBIC Blood Culture adequate volume Performed at [facility], [address], [place], [place] [address]    Culture  Setup Time   Final    GRAM POSITIVE COCCI IN BOTH AEROBIC AND ANAEROBIC BOTTLES Gram Stain Report Called to,Read Back By and Verified With: CHARMAINE BREEN [phone], COLLIN JINNY SETTLE STAIN REVIEWED-AGREE WITH RESULT DRT    Culture (A)  Final    STREPTOCOCCUS PNEUMONIAE SUSCEPTIBILITIES PERFORMED ON PREVIOUS CULTURE WITHIN THE LAST 5 DAYS. STAPHYLOCOCCUS CAPITIS THE SIGNIFICANCE OF ISOLATING THIS ORGANISM FROM A SINGLE SET OF BLOOD CULTURES WHEN MULTIPLE SETS ARE DRAWN IS UNCERTAIN. PLEASE NOTIFY THE MICROBIOLOGY DEPARTMENT WITHIN ONE WEEK IF SPECIATION AND SENSITIVITIES ARE REQUIRED. Performed at [facility], [address] [place], [place] [address]    Report Status 10/02/2023 FINAL  Final  Blood Culture ID Panel (Reflexed)     Status: Abnormal   Collection Time: 09/29/23  2:11 PM  Result Value Ref Range Status   Enterococcus faecalis NOT DETECTED NOT DETECTED Final   Enterococcus Faecium NOT DETECTED NOT DETECTED Final   Listeria monocytogenes NOT DETECTED NOT DETECTED Final   Staphylococcus species NOT DETECTED NOT DETECTED Final   Staphylococcus aureus (BCID) NOT DETECTED NOT DETECTED Final   Staphylococcus epidermidis NOT DETECTED NOT DETECTED Final   Staphylococcus lugdunensis NOT DETECTED NOT DETECTED Final   Streptococcus species DETECTED (A) NOT DETECTED Final    Comment: CRITICAL RESULT CALLED TO, READ BACK BY AND VERIFIED WITH: RN  JENNIFER HELBERG ON 09/30/23 @ 1909 BY DRT    Streptococcus agalactiae NOT DETECTED NOT DETECTED Final   Streptococcus pneumoniae DETECTED (A) NOT DETECTED Final    Comment: CRITICAL  RESULT CALLED TO, READ BACK BY AND VERIFIED WITH: RN JENNIFER HELBERG ON 09/30/23 @ 1909 BY DRT    Streptococcus pyogenes NOT DETECTED NOT DETECTED Final   A.calcoaceticus-baumannii NOT DETECTED NOT DETECTED Final   Bacteroides fragilis NOT DETECTED NOT DETECTED Final   Enterobacterales NOT DETECTED NOT DETECTED Final   Enterobacter cloacae complex NOT DETECTED NOT DETECTED Final   Escherichia coli NOT DETECTED NOT DETECTED Final   Klebsiella aerogenes NOT DETECTED NOT DETECTED Final   Klebsiella oxytoca NOT DETECTED NOT DETECTED Final   Klebsiella pneumoniae NOT DETECTED NOT DETECTED Final   Proteus species NOT DETECTED NOT DETECTED Final   Salmonella species NOT DETECTED NOT DETECTED Final   Serratia marcescens NOT DETECTED NOT DETECTED Final   Haemophilus influenzae NOT DETECTED NOT DETECTED Final   Neisseria meningitidis NOT DETECTED NOT DETECTED Final   Pseudomonas aeruginosa NOT DETECTED NOT DETECTED Final   Stenotrophomonas maltophilia NOT DETECTED NOT DETECTED Final   Candida albicans NOT DETECTED NOT DETECTED Final   Candida auris NOT DETECTED NOT DETECTED Final   Candida glabrata NOT DETECTED NOT DETECTED Final   Candida krusei NOT DETECTED NOT DETECTED Final   Candida parapsilosis NOT DETECTED NOT DETECTED Final   Candida tropicalis NOT DETECTED NOT DETECTED Final   Cryptococcus neoformans/gattii NOT DETECTED NOT DETECTED Final    Comment: Performed at [facility], [address], [place], [place] [address]  Culture, blood (Routine X 2) w Reflex to ID Panel     Status: None   Collection Time: 10/01/23  4:35 AM   Specimen: BLOOD  Result Value Ref Range Status   Specimen Description BLOOD LEFT WRIST  Final   Special Requests   Final    BOTTLES DRAWN AEROBIC AND ANAEROBIC Blood Culture  adequate volume   Culture   Final    NO GROWTH 5 DAYS Performed at [facility], [address] [place], [place] [address]    Report Status 10/06/2023 FINAL  Final  Culture, blood (Routine X 2) w Reflex to ID Panel     Status: None   Collection Time: 10/01/23  4:35 AM   Specimen: BLOOD  Result Value Ref Range Status   Specimen Description BLOOD LEFT HAND  Final   Special Requests   Final    BOTTLES DRAWN AEROBIC AND ANAEROBIC Blood Culture adequate volume   Culture   Final    NO GROWTH 5 DAYS Performed at [facility], [address] [place], [place] [address]    Report Status 10/06/2023 FINAL  Final    Radiology Studies: No results found.  Scheduled Meds:  amLODipine   5 mg Oral QPM   aspirin   325 mg Oral Daily   budesonide  (PULMICORT ) nebulizer solution  0.5 mg Nebulization BID   clopidogrel   75 mg Oral Daily   diazepam   5 mg Oral BID   feeding supplement  237 mL Oral BID BM   heparin   5,000 Units Subcutaneous Q8H   HYDROcodone -acetaminophen   1 tablet Oral BID   ipratropium-albuterol   3 mL Nebulization BID   levothyroxine   75 mcg Oral q AM   mirabegron  ER  50 mg Oral Daily   multivitamin with minerals  1 tablet Oral Daily   pantoprazole   40 mg Oral Daily   thiamine   100 mg Oral Daily   Continuous Infusions:  cefTRIAXone  (ROCEPHIN )  IV 2 g (10/07/23 1622)     LOS: 9 days    Time spent: 35 minutes    Marki Frede D Luvina Poirier,DO [facility]  If 7PM-7AM, please contact night-coverage [email or web address] 10/08/2023, 1:25 PM

## 2023-10-08 NOTE — Progress Notes (Signed)
 Physical Therapy Treatment Patient Details Name: Jordan Ward MRN: [id number] DOB: March 05, 1941 Today's Date: 10/08/2023   History of Present Illness Jordan Ward, an 83 y/o with multiple co-morbidities including, MS, CKD4, neurogenic bladder with superpubic cystostomy catheter, obstructed left ureter with nephrostomy tube, COPD/Emphysema on home oxygen , hypothyroidism on replacement, dysphagia, non-healing wound right ankle in setting of PAD, HFpEF, cerebral vascular disease. Over the past several days he has been weaker, more short of breath, coughing, left-sided LE weakness, increased slurred speech. He denies having fever, N/V, diarrhea other than his chronic loose stools. He was here to fore able to manage many of his ADLs including bathing, eating, taking his medications. Due to his increased weakness, slurred speech, shortness of breath EMS was activated and he was brought to [facility] for evaluation.    PT Comments  Patient presents in chair (assisted by nursing staff) and agreeable for therapy.  Patient very unsteady on feet and limited to a few side steps before having to sit due to fatigue, able complete BLE exercises, but once fatigued tends to lean/fall backwards while seated at bedside and put back to bed with mod assist for repositioning.  Patient will benefit from continued skilled physical therapy in hospital and recommended venue below to increase strength, balance, endurance for safe ADLs and gait.    If plan is discharge home, recommend the following: Help with stairs or ramp for entrance;A lot of help with bathing/dressing/bathroom;A lot of help with walking and/or transfers;Assistance with cooking/housework   Can travel by private vehicle     No  Equipment Recommendations  None recommended by PT    Recommendations for Other Services       Precautions / Restrictions Precautions Precautions: Fall Restrictions Weight Bearing Restrictions Per Provider Order: No     Mobility  Bed  Mobility Overal bed mobility: Needs Assistance Bed Mobility: Sit to Supine     Supine to sit: Min assist     General bed mobility comments: slow labored movement    Transfers Overall transfer level: Needs assistance Equipment used: Rolling walker (2 wheels) Transfers: Sit to/from Stand, Bed to chair/wheelchair/BSC Sit to Stand: Mod assist   Step pivot transfers: Mod assist       General transfer comment: very unstedy on feet    Ambulation/Gait Ambulation/Gait assistance: Mod assist Gait Distance (Feet): 4 Feet Assistive device: Rolling walker (2 wheels) Gait Pattern/deviations: Decreased step length - right, Decreased step length - left, Decreased stride length, Knees buckling Gait velocity: slow     General Gait Details: limited to a few steps at bedside with frequent buckling of knees due to BLE weakness   Stairs             Wheelchair Mobility     Tilt Bed    Modified Rankin (Stroke Patients Only)       Balance Overall balance assessment: Needs assistance Sitting-balance support: Feet supported, No upper extremity supported Sitting balance-Leahy Scale: Poor Sitting balance - Comments: seated at EOB   Standing balance support: Reliant on assistive device for balance, During functional activity, Bilateral upper extremity supported Standing balance-Leahy Scale: Poor Standing balance comment: using RW                            Cognition   Behavior During Therapy: WFL for tasks assessed/performed Overall Cognitive Status: Within Functional Limits for tasks assessed  Exercises General Exercises - Lower Extremity Long Arc Quad: AROM, Strengthening, Both, 10 reps, Seated Toe Raises: Seated, AROM, Strengthening, Both, 10 reps Heel Raises: Seated, AROM, Strengthening, Both, 10 reps    General Comments        Pertinent Vitals/Pain Pain Assessment Pain Assessment:  Faces Faces Pain Scale: Hurts little more Pain Location: Rt ankle bone where wound is Pain Descriptors / Indicators: Sore Pain Intervention(s): Limited activity within patient's tolerance, Monitored during session, Repositioned    Home Living                          Prior Function            PT Goals (current goals can now be found in the care plan section) Acute Rehab PT Goals Patient Stated Goal: return home after rehab PT Goal Formulation: With patient Time For Goal Achievement: 10/14/23 Potential to Achieve Goals: Good Progress towards PT goals: Progressing toward goals    Frequency    Min 3X/week      PT Plan      Co-evaluation              AM-PAC PT 6 Clicks Mobility   Outcome Measure  Help needed turning from your back to your side while in a flat bed without using bedrails?: A Little Help needed moving from lying on your back to sitting on the side of a flat bed without using bedrails?: A Little Help needed moving to and from a bed to a chair (including a wheelchair)?: A Lot Help needed standing up from a chair using your arms (e.g., wheelchair or bedside chair)?: A Lot Help needed to walk in hospital room?: A Lot Help needed climbing 3-5 steps with a railing? : A Lot 6 Click Score: 14    End of Session Equipment Utilized During Treatment: Oxygen  Activity Tolerance: Patient tolerated treatment well;Patient limited by fatigue Patient left: in bed;with call bell/phone within reach;with bed alarm set Nurse Communication: Mobility status PT Visit Diagnosis: Unsteadiness on feet (R26.81);Other abnormalities of gait and mobility (R26.89);Muscle weakness (generalized) (M62.81)     Time: 8480-8455 PT Time Calculation (min) (ACUTE ONLY): 25 min  Charges:    $Therapeutic Exercise: 8-22 mins $Therapeutic Activity: 8-22 mins PT General Charges $$ ACUTE PT VISIT: 1 Visit                     4:02 PM, 10/08/23 Lynwood Music, MPT Physical  Therapist with [facility] [phone] office [phone] mobile phone

## 2023-10-09 DIAGNOSIS — U071 COVID-19: Secondary | ICD-10-CM | POA: Diagnosis not present

## 2023-10-09 DIAGNOSIS — J1282 Pneumonia due to coronavirus disease 2019: Secondary | ICD-10-CM | POA: Diagnosis not present

## 2023-10-09 LAB — CBC WITH DIFFERENTIAL/PLATELET
Abs Immature Granulocytes: 0.16 10*3/uL — ABNORMAL HIGH (ref 0.00–0.07)
Basophils Absolute: 0 10*3/uL (ref 0.0–0.1)
Basophils Relative: 0 %
Eosinophils Absolute: 0 10*3/uL (ref 0.0–0.5)
Eosinophils Relative: 0 %
HCT: 33 % — ABNORMAL LOW (ref 39.0–52.0)
Hemoglobin: 10.2 g/dL — ABNORMAL LOW (ref 13.0–17.0)
Immature Granulocytes: 1 %
Lymphocytes Relative: 6 %
Lymphs Abs: 1.2 10*3/uL (ref 0.7–4.0)
MCH: 29.1 pg (ref 26.0–34.0)
MCHC: 30.9 g/dL (ref 30.0–36.0)
MCV: 94.3 fL (ref 80.0–100.0)
Monocytes Absolute: 0.6 10*3/uL (ref 0.1–1.0)
Monocytes Relative: 3 %
Neutro Abs: 17.5 10*3/uL — ABNORMAL HIGH (ref 1.7–7.7)
Neutrophils Relative %: 90 %
Platelets: 207 10*3/uL (ref 150–400)
RBC: 3.5 MIL/uL — ABNORMAL LOW (ref 4.22–5.81)
RDW: 16.6 % — ABNORMAL HIGH (ref 11.5–15.5)
WBC: 19.6 10*3/uL — ABNORMAL HIGH (ref 4.0–10.5)
nRBC: 0 % (ref 0.0–0.2)

## 2023-10-09 LAB — BASIC METABOLIC PANEL
Anion gap: 5 (ref 5–15)
BUN: 56 mg/dL — ABNORMAL HIGH (ref 8–23)
CO2: 19 mmol/L — ABNORMAL LOW (ref 22–32)
Calcium: 7.6 mg/dL — ABNORMAL LOW (ref 8.9–10.3)
Chloride: 112 mmol/L — ABNORMAL HIGH (ref 98–111)
Creatinine, Ser: 1.91 mg/dL — ABNORMAL HIGH (ref 0.61–1.24)
GFR, Estimated: 35 mL/min — ABNORMAL LOW (ref >60)
Glucose, Bld: 97 mg/dL (ref 70–99)
Potassium: 4.6 mmol/L (ref 3.5–5.1)
Sodium: 136 mmol/L (ref 135–145)

## 2023-10-09 LAB — MAGNESIUM: Magnesium: 2.1 mg/dL (ref 1.7–2.4)

## 2023-10-09 NOTE — Plan of Care (Signed)
   Problem: Education: Goal: Knowledge of risk factors and measures for prevention of condition will improve Outcome: Progressing   Problem: Coping: Goal: Psychosocial and spiritual needs will be supported Outcome: Progressing   Problem: Respiratory: Goal: Will maintain a patent airway Outcome: Progressing

## 2023-10-09 NOTE — Progress Notes (Signed)
 PROGRESS NOTE    Jordan Ward  FMW:[id number] DOB: 1940/12/22 DOA: 09/29/2023 PCP: Jordan Norleen PEDLAR, MD   Brief Narrative:    Jordan Ward, an 83 y/o with multiple co-morbidities including, MS, CKD4, neurogenic bladder with superpubic cystostomy catheter, obstructed left ureter with nephrostomy tube, COPD/Emphysema on home oxygen , hypothyroidism on replacement, dysphagia, non-healing wound right ankle in setting of PAD, HFpEF, cerebral vascular disease. Over the past several days he has been weaker, more short of breath, coughing, left-sided LE weakness, increased slurred speech.  Patient was admitted with COVID-pneumonia as well as significant cerebrovascular disease with small, acute CVA.  He is now noted to have strep pneumonia bacteremia as well.  Now awaiting SNF placement by 1/13.  Assessment & Plan:   Principal Problem:   Pneumonia due to COVID-19 virus Active Problems:   UTI (urinary tract infection)   Cerebrovascular disease   Chronic diastolic (congestive) heart failure (HCC)   CKD (chronic kidney disease) stage 4, GFR 15-29 ml/min (HCC)   Nephrostomy present (HCC)   Emphysema lung (HCC)   Multiple sclerosis (HCC)   Chronic suprapubic catheter (HCC)   Dysphagia   Hypothyroidism   Dilated cardiomyopathy (HCC)   Essential hypertension   COVID-19  Assessment and Plan:   Pneumonia due to COVID-19 virus - remdesivir  infusions has been completed. -Continue treatment with steroids, mucolytic's, flutter valve and bronchodilator management -Will continue supportive care and follow clinical response. -Needs isolation and quarantine through 1/13 until SNF can accept him. -TOC on more and assisting with placement. -Increase leukocytosis, monitor CBC with differential in a.m.   Cerebrovascular disease with small ischemic CVA -Complete stroke workup -Follow neurology service recommendation -Prior to admission using aspirin  for secondary prevention -Patient out of the window for any  thrombolytic management or intervention. -Will provide treatment with aspirin  and Plavix  for secondary prevention. -Per PT/OT recommendations patient will need a skilled nursing facility at discharge -LDL 16 -TOC aware and helping with placement.  Hypernatremia-improved -Complete D5 fluid and resume as needed -Monitor repeat labs in a.m.   Strep pneumonia bacteremia -Associated with chronic suprapubic catheter -Continue current IV antibiotics with sensitivity noted to Rocephin  day 10/10   Emphysema lung (HCC) -Continue oxygen  supplementation -Continue bronchodilator management -Receiving steroids as part of treatment for COVID -Follow clinical response and continue supportive care.   Nephrostomy present (HCC) -Continue nephrostomy tube care -Continue current antibiotic for UTI -Continue patient follow-up with urology service.   CKD (chronic kidney disease) stage 4, GFR 15-29 ml/min (HCC) -Continue to follow renal function trend -Continue minimizing nephrotoxic agents and avoid the use of contrast and hypotension. -Good urine output currently appreciated.   Chronic diastolic (congestive) heart failure (HCC) -Elevated BNP was present at time of admission -Some decreased breath sounds reported by admitting doctor patient's lung bases. -Provided on 09/30/2023 -Currently appears to be euvolemic; will hold on any further diuresis for the moment. -Follow daily weights and urine output -Low-sodium diet discussed with patient -Gentle IV fluid initiated as noted above   Essential hypertension -Stable for the most part -Continue to follow vital signs.   Dilated cardiomyopathy (HCC) -2D echocardiogram with LVEF 30-35% with global hypokinesis and grade 1 diastolic dysfunction -No significant signs of fluid overload on exam. -Continue to follow daily weights and strict I's and O's.   Hypothyroidism -TSH elevated -Free T4 1.07 -Continue Synthroid .   Dysphagia -Long standing  problem which he attributes to MS. Now with evidence of new CVA -Appreciate assistance and recommendation by speech therapy; further  improvement in his swallowing capacity appreciated; following recommendation by speech therapy (10/02/2023) diet has been upgraded to dysphagia 3 with thin liquids.   Chronic suprapubic catheter (HCC) -Catheter site appears clean and normal -No erythematous changes or drainage -Continue current IV antibiotics -Continue local care. -Continue patient follow-up with urology service. -Resumed home Myrbetriq    Multiple sclerosis (HCC) -No receiving any medications at the moment -Continue outpatient follow-up with neurology.  DVT prophylaxis:Heparin  Code Status: Full Family Communication: Discussed with wife at bedside 1/4 Disposition Plan:  Status is: Inpatient Remains inpatient appropriate because: Need for placement and IV antibiotics.  Skin Assessment:  I have examined the patient's skin and I agree with the wound assessment as performed by the wound care RN as outlined below:  Pressure Injury 09/30/23 Ankle Left;Lateral Stage 4 - Full thickness tissue loss with exposed bone, tendon or muscle. 1x1cm open area to outside of right ankle, (Active)  09/30/23 1301  Location: Ankle  Location Orientation: Left;Lateral  Staging: Stage 4 - Full thickness tissue loss with exposed bone, tendon or muscle.  Wound Description (Comments): 1x1cm open area to outside of right ankle,  Present on Admission: Yes  Dressing Type Foam - Lift dressing to assess site every shift 10/09/23 0419     Pressure Injury 09/30/23 Buttocks Right;Left Deep Tissue Pressure Injury - Purple or maroon localized area of discolored intact skin or blood-filled blister due to damage of underlying soft tissue from pressure and/or shear. red nonblanchable area to sac (Active)  09/30/23 1130  Location: Buttocks  Location Orientation: Right;Left  Staging: Deep Tissue Pressure Injury - Purple or  maroon localized area of discolored intact skin or blood-filled blister due to damage of underlying soft tissue from pressure and/or shear.  Wound Description (Comments): red nonblanchable area to sacrum and buttocks with purple are in the center, under right buttock a 0.5x0.5 open area  Present on Admission: Yes  Dressing Type Foam - Lift dressing to assess site every shift 10/09/23 0419    Consultants:  None  Procedures:  None  Antimicrobials:  Anti-infectives (From admission, onward)    Start     Dose/Rate Route Frequency Ordered Stop   10/01/23 1600  cefTRIAXone  (ROCEPHIN ) 2 g in sodium chloride  0.9 % 100 mL IVPB        2 g 200 mL/hr over 30 Minutes Intravenous Every 24 hours 09/30/23 1940 10/09/23 2359   09/30/23 2030  cefTRIAXone  (ROCEPHIN ) 1 g in sodium chloride  0.9 % 100 mL IVPB        1 g 200 mL/hr over 30 Minutes Intravenous  Once 09/30/23 1940 10/01/23 1416   09/30/23 1600  cefTRIAXone  (ROCEPHIN ) 1 g in sodium chloride  0.9 % 100 mL IVPB  Status:  Discontinued        1 g 200 mL/hr over 30 Minutes Intravenous Every 24 hours 09/29/23 2318 09/30/23 1940   09/30/23 1000  remdesivir  100 mg in sodium chloride  0.9 % 100 mL IVPB  Status:  Discontinued       Placed in Followed by Linked Group   100 mg 200 mL/hr over 30 Minutes Intravenous Daily 09/29/23 2317 09/29/23 2318   09/30/23 1000  remdesivir  100 mg in sodium chloride  0.9 % 100 mL IVPB       Placed in Followed by Linked Group   100 mg 200 mL/hr over 30 Minutes Intravenous Daily 09/29/23 2319 10/01/23 0902   09/29/23 2348  remdesivir  100 mg in sodium chloride  0.9 % 100 mL IVPB  Placed in Followed by Linked Group   100 mg 200 mL/hr over 30 Minutes Intravenous  Once 09/29/23 2319 09/30/23 0222   09/29/23 2330  remdesivir  100 mg in sodium chloride  0.9 % 100 mL IVPB       Placed in Followed by Linked Group   100 mg 200 mL/hr over 30 Minutes Intravenous  Once 09/29/23 2319 09/30/23 0118   09/29/23 2315   remdesivir  200 mg in sodium chloride  0.9% 250 mL IVPB  Status:  Discontinued       Placed in Followed by Linked Group   200 mg 580 mL/hr over 30 Minutes Intravenous Once 09/29/23 2317 09/29/23 2318   09/29/23 1515  cefTRIAXone  (ROCEPHIN ) 1 g in sodium chloride  0.9 % 100 mL IVPB        1 g 200 mL/hr over 30 Minutes Intravenous  Once 09/29/23 1506 09/29/23 1627   09/29/23 1515  azithromycin  (ZITHROMAX ) 500 mg in sodium chloride  0.9 % 250 mL IVPB        500 mg 250 mL/hr over 60 Minutes Intravenous  Once 09/29/23 1506 09/29/23 1924      Subjective: Patient seen and evaluated this a.m. with no new complaints or concerns noted.  He states that his throat is feeling better this morning.  Objective: Vitals:   10/09/23 0412 10/09/23 0429 10/09/23 0814 10/09/23 0818  BP: 117/66 115/62    Pulse: 72 72    Resp: 20 20    Temp: 97.8 F (36.6 C) 98.8 F (37.1 C)    TempSrc: Oral Oral    SpO2: 94% 92% 90% 93%  Weight:      Height:        Intake/Output Summary (Last 24 hours) at 10/09/2023 0931 Last data filed at 10/09/2023 9071 Gross per 24 hour  Intake 720 ml  Output 1650 ml  Net -930 ml   Filed Weights   09/29/23 1331  Weight: 50.8 kg    Examination: General exam: Alert, awake, oriented x 3; doing better overall and demonstrated improvement in his swallowing.  Some throat discomfort reported. Respiratory system: Positive scattered rhonchi; no significant expiratory wheezing.  No using accessory muscle.  2 L nasal cannula in place with good oxygen  saturation appreciated. Cardiovascular system: Rate controlled, no rubs, no gallops, no JVD. Gastrointestinal system: Abdomen is nondistended, soft and nontender.  Positive bowel sounds appreciated.  Nephrostomy tube and suprapubic catheter in place without signs of superimposed infection in the area.  No drainage. Central nervous system: Generally weak; no new focal neurological deficits.  Patient with chronic residual left-sided weakness  from previous stroke. Extremities: No cyanosis or clubbing. Skin: No petechiae. Psychiatry: Flat affect appreciated on exam.  Data Reviewed: I have personally reviewed following labs and imaging studies  CBC: Recent Labs  Lab 10/03/23 0402 10/05/23 0458 10/08/23 0749 10/09/23 0400  WBC 11.2* 19.2* 27.4* 19.6*  NEUTROABS  --   --   --  17.5*  HGB 11.3* 11.2* 12.5* 10.2*  HCT 36.0* 34.5* 41.2 33.0*  MCV 91.1 92.2 97.9 94.3  PLT 197 220 244 207   Basic Metabolic Panel: Recent Labs  Lab 10/03/23 0402 10/04/23 0340 10/05/23 0458 10/08/23 0749 10/09/23 0400  NA 153* 147* 143 142 136  K 4.0 4.8 5.1 4.8 4.6  CL 123* 118* 118* 112* 112*  CO2 21* 21* 17* 18* 19*  GLUCOSE 154* 175* 153* 96 97  BUN 71* 63* 69* 66* 56*  CREATININE 2.31* 2.04* 2.07* 1.93* 1.91*  CALCIUM  8.4* 8.1* 8.1*  8.0* 7.6*  MG 2.2  --  2.2 2.5* 2.1   GFR: Estimated Creatinine Clearance: 22.2 mL/min (A) (by C-G formula based on SCr of 1.91 mg/dL (H)).  Liver Function Tests: No results for input(s): AST, ALT, ALKPHOS, BILITOT, PROT, ALBUMIN in the last 168 hours.  No results for input(s): LIPASE, AMYLASE in the last 168 hours.  CBG: Recent Labs  Lab 10/03/23 0325  GLUCAP 146*   Sepsis Labs: Recent Labs  Lab 10/08/23 0749  PROCALCITON 0.23    Recent Results (from the past 240 hours)  Resp panel by RT-PCR (RSV, Flu A&B, Covid) Anterior Nasal Swab     Status: Abnormal   Collection Time: 09/29/23 12:13 PM   Specimen: Anterior Nasal Swab  Result Value Ref Range Status   SARS Coronavirus 2 by RT PCR POSITIVE (A) NEGATIVE Final    Comment: (NOTE) SARS-CoV-2 target nucleic acids are DETECTED.  The SARS-CoV-2 RNA is generally detectable in upper respiratory specimens during the acute phase of infection. Positive results are indicative of the presence of the identified virus, but do not rule out bacterial infection or co-infection with other pathogens not detected by the test.  Clinical correlation with patient history and other diagnostic information is necessary to determine patient infection status. The expected result is Negative.  Fact Sheet for Patients: bloggercourse.com  Fact Sheet for Healthcare Providers: seriousbroker.it  This test is not yet approved or cleared by the [place]  FDA and  has been authorized for detection and/or diagnosis of SARS-CoV-2 by FDA under an Emergency Use Authorization (EUA).  This EUA will remain in effect (meaning this test can be used) for the duration of  the COVID-19 declaration under Section 564(b)(1) of the A ct, 21 U.S.C. section 360bbb-3(b)(1), unless the authorization is terminated or revoked sooner.     Influenza A by PCR NEGATIVE NEGATIVE Final   Influenza B by PCR NEGATIVE NEGATIVE Final    Comment: (NOTE) The Xpert Xpress SARS-CoV-2/FLU/RSV plus assay is intended as an aid in the diagnosis of influenza from Nasopharyngeal swab specimens and should not be used as a sole basis for treatment. Nasal washings and aspirates are unacceptable for Xpert Xpress SARS-CoV-2/FLU/RSV testing.  Fact Sheet for Patients: bloggercourse.com  Fact Sheet for Healthcare Providers: seriousbroker.it  This test is not yet approved or cleared by the [place]  FDA and has been authorized for detection and/or diagnosis of SARS-CoV-2 by FDA under an Emergency Use Authorization (EUA). This EUA will remain in effect (meaning this test can be used) for the duration of the COVID-19 declaration under Section 564(b)(1) of the Act, 21 U.S.C. section 360bbb-3(b)(1), unless the authorization is terminated or revoked.     Resp Syncytial Virus by PCR NEGATIVE NEGATIVE Final    Comment: (NOTE) Fact Sheet for Patients: bloggercourse.com  Fact Sheet for Healthcare  Providers: seriousbroker.it  This test is not yet approved or cleared by the [place]  FDA and has been authorized for detection and/or diagnosis of SARS-CoV-2 by FDA under an Emergency Use Authorization (EUA). This EUA will remain in effect (meaning this test can be used) for the duration of the COVID-19 declaration under Section 564(b)(1) of the Act, 21 U.S.C. section 360bbb-3(b)(1), unless the authorization is terminated or revoked.  Performed at [facility], [address], [place], [place] [address]   Culture, blood (Routine x 2)     Status: Abnormal   Collection Time: 09/29/23  2:11 PM   Specimen: BLOOD RIGHT FOREARM  Result Value Ref Range Status  Specimen Description   Final    BLOOD RIGHT FOREARM Performed at [facility], [address] [place], [place] [address]    Special Requests   Final    Blood Culture adequate volume BOTTLES DRAWN AEROBIC AND ANAEROBIC Performed at [facility], [address], [place], [place] [address]    Culture  Setup Time   Final    GRAM POSITIVE COCCI IN BOTH AEROBIC AND ANAEROBIC BOTTLES Gram Stain Report Called to,Read Back By and Verified With: CHARMAINE BREEN [phone], COLLIN JINNY SETTLE STAIN REVIEWED-AGREE WITH RESULT DRT CRITICAL RESULT CALLED TO, READ BACK BY AND VERIFIED WITH: RN JENNIFER HELBERG ON 09/30/23 @ 1909 BY DRT Performed at [facility], [address], [place], [place] [address]    Culture STREPTOCOCCUS PNEUMONIAE (A)  Final   Report Status 10/02/2023 FINAL  Final   Organism ID, Bacteria STREPTOCOCCUS PNEUMONIAE  Final      Susceptibility   Streptococcus pneumoniae - MIC*    ERYTHROMYCIN <=0.12 SENSITIVE Sensitive     LEVOFLOXACIN  0.5 SENSITIVE Sensitive     VANCOMYCIN  0.5 SENSITIVE Sensitive     PENICILLIN (meningitis) <=0.06 SENSITIVE Sensitive     PENO - penicillin <=0.06      PENICILLIN (non-meningitis) <=0.06 SENSITIVE Sensitive     PENICILLIN (oral) <=0.06 SENSITIVE  Sensitive     CEFTRIAXONE  (non-meningitis) <=0.12 SENSITIVE Sensitive     CEFTRIAXONE  (meningitis) <=0.12 SENSITIVE Sensitive     * STREPTOCOCCUS PNEUMONIAE  Culture, blood (Routine x 2)     Status: Abnormal   Collection Time: 09/29/23  2:11 PM   Specimen: BLOOD LEFT HAND  Result Value Ref Range Status   Specimen Description   Final    BLOOD LEFT HAND Performed at [facility], [address], [place], [place] [address]    Special Requests   Final    BOTTLES DRAWN AEROBIC AND ANAEROBIC Blood Culture adequate volume Performed at [facility], [address], [place], [place] [address]    Culture  Setup Time   Final    GRAM POSITIVE COCCI IN BOTH AEROBIC AND ANAEROBIC BOTTLES Gram Stain Report Called to,Read Back By and Verified With: CHARMAINE BREEN [phone], COLLIN JINNY SETTLE STAIN REVIEWED-AGREE WITH RESULT DRT    Culture (A)  Final    STREPTOCOCCUS PNEUMONIAE SUSCEPTIBILITIES PERFORMED ON PREVIOUS CULTURE WITHIN THE LAST 5 DAYS. STAPHYLOCOCCUS CAPITIS THE SIGNIFICANCE OF ISOLATING THIS ORGANISM FROM A SINGLE SET OF BLOOD CULTURES WHEN MULTIPLE SETS ARE DRAWN IS UNCERTAIN. PLEASE NOTIFY THE MICROBIOLOGY DEPARTMENT WITHIN ONE WEEK IF SPECIATION AND SENSITIVITIES ARE REQUIRED. Performed at [facility], [address], [place], [place] [address]    Report Status 10/02/2023 FINAL  Final  Blood Culture ID Panel (Reflexed)     Status: Abnormal   Collection Time: 09/29/23  2:11 PM  Result Value Ref Range Status   Enterococcus faecalis NOT DETECTED NOT DETECTED Final   Enterococcus Faecium NOT DETECTED NOT DETECTED Final   Listeria monocytogenes NOT DETECTED NOT DETECTED Final   Staphylococcus species NOT DETECTED NOT DETECTED Final   Staphylococcus aureus (BCID) NOT DETECTED NOT DETECTED Final   Staphylococcus epidermidis NOT DETECTED NOT DETECTED Final   Staphylococcus lugdunensis NOT DETECTED NOT DETECTED Final   Streptococcus species DETECTED (A) NOT DETECTED Final     Comment: CRITICAL RESULT CALLED TO, READ BACK BY AND VERIFIED WITH: RN JENNIFER HELBERG ON 09/30/23 @ 1909 BY DRT    Streptococcus agalactiae NOT DETECTED NOT DETECTED Final  Streptococcus pneumoniae DETECTED (A) NOT DETECTED Final    Comment: CRITICAL RESULT CALLED TO, READ BACK BY AND VERIFIED WITH: RN JENNIFER HELBERG ON 09/30/23 @ 1909 BY DRT    Streptococcus pyogenes NOT DETECTED NOT DETECTED Final   A.calcoaceticus-baumannii NOT DETECTED NOT DETECTED Final   Bacteroides fragilis NOT DETECTED NOT DETECTED Final   Enterobacterales NOT DETECTED NOT DETECTED Final   Enterobacter cloacae complex NOT DETECTED NOT DETECTED Final   Escherichia coli NOT DETECTED NOT DETECTED Final   Klebsiella aerogenes NOT DETECTED NOT DETECTED Final   Klebsiella oxytoca NOT DETECTED NOT DETECTED Final   Klebsiella pneumoniae NOT DETECTED NOT DETECTED Final   Proteus species NOT DETECTED NOT DETECTED Final   Salmonella species NOT DETECTED NOT DETECTED Final   Serratia marcescens NOT DETECTED NOT DETECTED Final   Haemophilus influenzae NOT DETECTED NOT DETECTED Final   Neisseria meningitidis NOT DETECTED NOT DETECTED Final   Pseudomonas aeruginosa NOT DETECTED NOT DETECTED Final   Stenotrophomonas maltophilia NOT DETECTED NOT DETECTED Final   Candida albicans NOT DETECTED NOT DETECTED Final   Candida auris NOT DETECTED NOT DETECTED Final   Candida glabrata NOT DETECTED NOT DETECTED Final   Candida krusei NOT DETECTED NOT DETECTED Final   Candida parapsilosis NOT DETECTED NOT DETECTED Final   Candida tropicalis NOT DETECTED NOT DETECTED Final   Cryptococcus neoformans/gattii NOT DETECTED NOT DETECTED Final    Comment: Performed at [facility], [address] [place], [place] [address]  Culture, blood (Routine X 2) w Reflex to ID Panel     Status: None   Collection Time: 10/01/23  4:35 AM   Specimen: BLOOD  Result Value Ref Range Status   Specimen Description BLOOD LEFT WRIST  Final   Special  Requests   Final    BOTTLES DRAWN AEROBIC AND ANAEROBIC Blood Culture adequate volume   Culture   Final    NO GROWTH 5 DAYS Performed at [facility], [address], [place], [place] [address]    Report Status 10/06/2023 FINAL  Final  Culture, blood (Routine X 2) w Reflex to ID Panel     Status: None   Collection Time: 10/01/23  4:35 AM   Specimen: BLOOD  Result Value Ref Range Status   Specimen Description BLOOD LEFT HAND  Final   Special Requests   Final    BOTTLES DRAWN AEROBIC AND ANAEROBIC Blood Culture adequate volume   Culture   Final    NO GROWTH 5 DAYS Performed at [facility], [address], [place], [place] [address]    Report Status 10/06/2023 FINAL  Final    Radiology Studies: No results found.  Scheduled Meds:  aspirin   325 mg Oral Daily   budesonide  (PULMICORT ) nebulizer solution  0.5 mg Nebulization BID   clopidogrel   75 mg Oral Daily   diazepam   5 mg Oral BID   feeding supplement  237 mL Oral BID BM   heparin   5,000 Units Subcutaneous Q8H   HYDROcodone -acetaminophen   1 tablet Oral BID   ipratropium-albuterol   3 mL Nebulization BID   levothyroxine   75 mcg Oral q AM   mirabegron  ER  50 mg Oral Daily   multivitamin with minerals  1 tablet Oral Daily   pantoprazole   40 mg Oral Daily   thiamine   100 mg Oral Daily   Continuous Infusions:  cefTRIAXone  (ROCEPHIN )  IV 2 g (10/08/23 1500)     LOS: 10 days    Time spent: 35 minutes  Greycen Felter D Devanee Pomplun,DO [facility]  If 7PM-7AM, please contact night-coverage [email or web address] 10/09/2023, 9:31 AM

## 2023-10-09 NOTE — Plan of Care (Signed)
 Pt is alert and oriented x 4. Up with 1 assist at hs stood at bedside. Suprabuic cath draining minimum. Left nephrostomy. 2L /[place] on. No prns given pt on scheduled norco and valium  Problem: Education: Goal: Knowledge of risk factors and measures for prevention of condition will improve Outcome: Progressing   Problem: Coping: Goal: Psychosocial and spiritual needs will be supported Outcome: Progressing   Problem: Respiratory: Goal: Will maintain a patent airway Outcome: Progressing Goal: Complications related to the disease process, condition or treatment will be avoided or minimized Outcome: Progressing   Problem: Education: Goal: Knowledge of General Education information will improve Description: Including pain rating scale, medication(s)/side effects and non-pharmacologic comfort measures Outcome: Progressing   Problem: Health Behavior/Discharge Planning: Goal: Ability to manage health-related needs will improve Outcome: Progressing   Problem: Clinical Measurements: Goal: Ability to maintain clinical measurements within normal limits will improve Outcome: Progressing Goal: Will remain free from infection Outcome: Progressing Goal: Diagnostic test results will improve Outcome: Progressing Goal: Respiratory complications will improve Outcome: Progressing Goal: Cardiovascular complication will be avoided Outcome: Progressing   Problem: Activity: Goal: Risk for activity intolerance will decrease Outcome: Progressing   Problem: Nutrition: Goal: Adequate nutrition will be maintained Outcome: Progressing   Problem: Coping: Goal: Level of anxiety will decrease Outcome: Progressing   Problem: Elimination: Goal: Will not experience complications related to bowel motility Outcome: Progressing   Problem: Pain Management: Goal: General experience of comfort will improve Outcome: Progressing   Problem: Safety: Goal: Ability to remain free from injury will  improve Outcome: Progressing   Problem: Skin Integrity: Goal: Risk for impaired skin integrity will decrease Outcome: Progressing   Problem: Education: Goal: Knowledge of disease or condition will improve Outcome: Progressing Goal: Knowledge of secondary prevention will improve (MUST DOCUMENT ALL) Outcome: Progressing Goal: Knowledge of patient specific risk factors will improve Alonso N/A or DELETE if not current risk factor) Outcome: Progressing   Problem: Ischemic Stroke/TIA Tissue Perfusion: Goal: Complications of ischemic stroke/TIA will be minimized Outcome: Progressing   Problem: Coping: Goal: Will verbalize positive feelings about self Outcome: Progressing Goal: Will identify appropriate support needs Outcome: Progressing   Problem: Health Behavior/Discharge Planning: Goal: Ability to manage health-related needs will improve Outcome: Progressing Goal: Goals will be collaboratively established with patient/family Outcome: Progressing   Problem: Self-Care: Goal: Ability to participate in self-care as condition permits will improve Outcome: Progressing Goal: Verbalization of feelings and concerns over difficulty with self-care will improve Outcome: Progressing Goal: Ability to communicate needs accurately will improve Outcome: Progressing   Problem: Nutrition: Goal: Risk of aspiration will decrease Outcome: Progressing Goal: Dietary intake will improve Outcome: Progressing   Problem: Education: Goal: Knowledge of disease or condition will improve Outcome: Progressing Goal: Knowledge of secondary prevention will improve (MUST DOCUMENT ALL) Outcome: Progressing Goal: Knowledge of patient specific risk factors will improve Alonso N/A or DELETE if not current risk factor) Outcome: Progressing   Problem: Ischemic Stroke/TIA Tissue Perfusion: Goal: Complications of ischemic stroke/TIA will be minimized Outcome: Progressing   Problem: Coping: Goal: Will verbalize  positive feelings about self Outcome: Progressing Goal: Will identify appropriate support needs Outcome: Progressing   Problem: Health Behavior/Discharge Planning: Goal: Ability to manage health-related needs will improve Outcome: Progressing Goal: Goals will be collaboratively established with patient/family Outcome: Progressing   Problem: Self-Care: Goal: Ability to participate in self-care as condition permits will improve Outcome: Progressing Goal: Verbalization of feelings and concerns over difficulty with self-care will improve Outcome: Progressing Goal: Ability to communicate needs accurately will  improve Outcome: Progressing   Problem: Nutrition: Goal: Risk of aspiration will decrease Outcome: Progressing Goal: Dietary intake will improve Outcome: Progressing

## 2023-10-09 NOTE — Plan of Care (Signed)
 Problem: Education: Goal: Knowledge of risk factors and measures for prevention of condition will improve Outcome: Progressing   Problem: Coping: Goal: Psychosocial and spiritual needs will be supported Outcome: Progressing   Problem: Respiratory: Goal: Will maintain a patent airway Outcome: Progressing Goal: Complications related to the disease process, condition or treatment will be avoided or minimized Outcome: Progressing   Problem: Education: Goal: Knowledge of General Education information will improve Description: Including pain rating scale, medication(s)/side effects and non-pharmacologic comfort measures Outcome: Progressing   Problem: Health Behavior/Discharge Planning: Goal: Ability to manage health-related needs will improve Outcome: Progressing   Problem: Clinical Measurements: Goal: Ability to maintain clinical measurements within normal limits will improve Outcome: Progressing Goal: Will remain free from infection Outcome: Progressing Goal: Diagnostic test results will improve Outcome: Progressing Goal: Respiratory complications will improve Outcome: Progressing Goal: Cardiovascular complication will be avoided Outcome: Progressing   Problem: Activity: Goal: Risk for activity intolerance will decrease Outcome: Progressing   Problem: Nutrition: Goal: Adequate nutrition will be maintained Outcome: Progressing   Problem: Coping: Goal: Level of anxiety will decrease Outcome: Progressing   Problem: Elimination: Goal: Will not experience complications related to bowel motility Outcome: Progressing   Problem: Pain Management: Goal: General experience of comfort will improve Outcome: Progressing   Problem: Safety: Goal: Ability to remain free from injury will improve Outcome: Progressing   Problem: Skin Integrity: Goal: Risk for impaired skin integrity will decrease Outcome: Progressing   Problem: Education: Goal: Knowledge of disease or  condition will improve 10/09/2023 0548 by Debby Truett FALCON, RN Outcome: Progressing 10/09/2023 0353 by Debby Truett FALCON, RN Outcome: Progressing Goal: Knowledge of secondary prevention will improve (MUST DOCUMENT ALL) 10/09/2023 0548 by Debby Truett FALCON, RN Outcome: Progressing 10/09/2023 0353 by Debby Truett FALCON, RN Outcome: Progressing Goal: Knowledge of patient specific risk factors will improve Alonso N/A or DELETE if not current risk factor) 10/09/2023 0548 by Debby Truett FALCON, RN Outcome: Progressing 10/09/2023 0353 by Debby Truett FALCON, RN Outcome: Progressing   Problem: Ischemic Stroke/TIA Tissue Perfusion: Goal: Complications of ischemic stroke/TIA will be minimized 10/09/2023 0548 by Debby Truett FALCON, RN Outcome: Progressing 10/09/2023 0353 by Debby Truett FALCON, RN Outcome: Progressing   Problem: Coping: Goal: Will verbalize positive feelings about self 10/09/2023 0548 by Debby Truett FALCON, RN Outcome: Progressing 10/09/2023 0353 by Debby Truett FALCON, RN Outcome: Progressing Goal: Will identify appropriate support needs 10/09/2023 0548 by Debby Truett FALCON, RN Outcome: Progressing 10/09/2023 0353 by Debby Truett FALCON, RN Outcome: Progressing   Problem: Health Behavior/Discharge Planning: Goal: Ability to manage health-related needs will improve 10/09/2023 0548 by Debby Truett FALCON, RN Outcome: Progressing 10/09/2023 0353 by Debby Truett FALCON, RN Outcome: Progressing Goal: Goals will be collaboratively established with patient/family 10/09/2023 0548 by Debby Truett FALCON, RN Outcome: Progressing 10/09/2023 0353 by Debby Truett FALCON, RN Outcome: Progressing   Problem: Self-Care: Goal: Ability to participate in self-care as condition permits will improve Outcome: Progressing Goal: Verbalization of feelings and concerns over difficulty with self-care will improve Outcome: Progressing Goal: Ability to communicate needs accurately will improve Outcome: Progressing   Problem:  Nutrition: Goal: Risk of aspiration will decrease Outcome: Progressing Goal: Dietary intake will improve Outcome: Progressing   Problem: Education: Goal: Knowledge of disease or condition will improve Outcome: Progressing Goal: Knowledge of secondary prevention will improve (MUST DOCUMENT ALL) Outcome: Progressing Goal: Knowledge of patient specific risk factors will improve Alonso N/A or DELETE if not current risk factor) Outcome: Progressing   Problem: Ischemic Stroke/TIA Tissue Perfusion: Goal:  Complications of ischemic stroke/TIA will be minimized Outcome: Progressing   Problem: Coping: Goal: Will verbalize positive feelings about self Outcome: Progressing Goal: Will identify appropriate support needs Outcome: Progressing   Problem: Health Behavior/Discharge Planning: Goal: Ability to manage health-related needs will improve Outcome: Progressing Goal: Goals will be collaboratively established with patient/family Outcome: Progressing   Problem: Self-Care: Goal: Ability to participate in self-care as condition permits will improve Outcome: Progressing Goal: Verbalization of feelings and concerns over difficulty with self-care will improve Outcome: Progressing Goal: Ability to communicate needs accurately will improve Outcome: Progressing   Problem: Nutrition: Goal: Risk of aspiration will decrease Outcome: Progressing Goal: Dietary intake will improve Outcome: Progressing

## 2023-10-10 DIAGNOSIS — J1282 Pneumonia due to coronavirus disease 2019: Secondary | ICD-10-CM | POA: Diagnosis not present

## 2023-10-10 DIAGNOSIS — U071 COVID-19: Secondary | ICD-10-CM | POA: Diagnosis not present

## 2023-10-10 MED ORDER — GABAPENTIN 100 MG PO CAPS
100.0000 mg | ORAL_CAPSULE | Freq: Two times a day (BID) | ORAL | Status: DC
  Administered 2023-10-10 – 2023-10-12 (×5): 100 mg via ORAL
  Filled 2023-10-10 (×5): qty 1

## 2023-10-10 MED ORDER — GUAIFENESIN-DM 100-10 MG/5ML PO SYRP: 5.0000 mL | ORAL_SOLUTION | ORAL | Status: DC | PRN

## 2023-10-10 NOTE — Progress Notes (Signed)
 PROGRESS NOTE    Jordan Ward  FMW:[id number] DOB: [birth year] DOA: 09/29/2023 PCP: Shona Norleen PEDLAR, MD   Brief Narrative:    Mr. Grillo, an 83 y/o with multiple co-morbidities including, MS, CKD4, neurogenic bladder with superpubic cystostomy catheter, obstructed left ureter with nephrostomy tube, COPD/Emphysema on home oxygen , hypothyroidism on replacement, dysphagia, non-healing wound right ankle in setting of PAD, HFpEF, cerebral vascular disease. Over the past several days he has been weaker, more short of breath, coughing, left-sided LE weakness, increased slurred speech.  Patient was admitted with COVID-pneumonia as well as significant cerebrovascular disease with small, acute CVA.  He is now noted to have strep pneumonia bacteremia as well.  Now awaiting SNF placement by 1/13.  Assessment & Plan:   Principal Problem:   Pneumonia due to COVID-19 virus Active Problems:   UTI (urinary tract infection)   Cerebrovascular disease   Chronic diastolic (congestive) heart failure (HCC)   CKD (chronic kidney disease) stage 4, GFR 15-29 ml/min (HCC)   Nephrostomy present (HCC)   Emphysema lung (HCC)   Multiple sclerosis (HCC)   Chronic suprapubic catheter (HCC)   Dysphagia   Hypothyroidism   Dilated cardiomyopathy (HCC)   Essential hypertension   COVID-19  Assessment and Plan:   Pneumonia due to COVID-19 virus - remdesivir  infusions has been completed. -Continue treatment with steroids, mucolytic's, flutter valve and bronchodilator management -Will continue supportive care and follow clinical response. -Needs isolation and quarantine through 1/13 until SNF can accept him. -TOC on more and assisting with placement. -Increase leukocytosis, monitor CBC with differential in a.m.   Cerebrovascular disease with small ischemic CVA -Complete stroke workup -Follow neurology service recommendation -Prior to admission using aspirin  for secondary prevention -Patient out of the window for any  thrombolytic management or intervention. -Will provide treatment with aspirin  and Plavix  for secondary prevention. -Per PT/OT recommendations patient will need a skilled nursing facility at discharge -LDL 16 -TOC aware and helping with placement.  Hypernatremia-improved -Complete D5 fluid and resume as needed -Monitor repeat labs in a.m.   Strep pneumonia bacteremia -Associated with chronic suprapubic catheter -Continue current IV antibiotics with sensitivity noted to Rocephin  day 10/10   Emphysema lung (HCC) -Continue oxygen  supplementation -Continue bronchodilator management -Receiving steroids as part of treatment for COVID -Follow clinical response and continue supportive care.   Nephrostomy present (HCC) -Continue nephrostomy tube care -Continue current antibiotic for UTI -Continue patient follow-up with urology service.   CKD (chronic kidney disease) stage 4, GFR 15-29 ml/min (HCC) -Continue to follow renal function trend -Continue minimizing nephrotoxic agents and avoid the use of contrast and hypotension. -Good urine output currently appreciated.   Chronic diastolic (congestive) heart failure (HCC) -Elevated BNP was present at time of admission -Some decreased breath sounds reported by admitting doctor patient's lung bases. -Provided on 09/30/2023 -Currently appears to be euvolemic; will hold on any further diuresis for the moment. -Follow daily weights and urine output -Low-sodium diet discussed with patient -Gentle IV fluid initiated as noted above   Essential hypertension -Stable for the most part -Continue to follow vital signs.   Dilated cardiomyopathy (HCC) -2D echocardiogram with LVEF 30-35% with global hypokinesis and grade 1 diastolic dysfunction -No significant signs of fluid overload on exam. -Continue to follow daily weights and strict I's and O's.   Hypothyroidism -TSH elevated -Free T4 1.07 -Continue Synthroid .   Dysphagia -Long standing  problem which he attributes to MS. Now with evidence of new CVA -Appreciate assistance and recommendation by speech therapy; further  improvement in his swallowing capacity appreciated; following recommendation by speech therapy (10/02/2023) diet has been upgraded to dysphagia 3 with thin liquids.   Chronic suprapubic catheter (HCC) -Catheter site appears clean and normal -No erythematous changes or drainage -Continue current IV antibiotics -Continue local care. -Continue patient follow-up with urology service. -Resumed home Myrbetriq    Multiple sclerosis (HCC) -No receiving any medications at the moment -Continue outpatient follow-up with neurology. -Noted to have restless legs, start gabapentin  1/11  DVT prophylaxis:Heparin  Code Status: Full Family Communication: Discussed with wife at bedside 1/4 Disposition Plan:  Status is: Inpatient Remains inpatient appropriate because: Need for placement and IV antibiotics.  Skin Assessment:  I have examined the patient's skin and I agree with the wound assessment as performed by the wound care RN as outlined below:  Pressure Injury 09/30/23 Ankle Left;Lateral Stage 4 - Full thickness tissue loss with exposed bone, tendon or muscle. 1x1cm open area to outside of right ankle, (Active)  09/30/23 1301  Location: Ankle  Location Orientation: Left;Lateral  Staging: Stage 4 - Full thickness tissue loss with exposed bone, tendon or muscle.  Wound Description (Comments): 1x1cm open area to outside of right ankle,  Present on Admission: Yes  Dressing Type Foam - Lift dressing to assess site every shift 10/09/23 1952     Pressure Injury 09/30/23 Buttocks Right;Left Deep Tissue Pressure Injury - Purple or maroon localized area of discolored intact skin or blood-filled blister due to damage of underlying soft tissue from pressure and/or shear. red nonblanchable area to sac (Active)  09/30/23 1130  Location: Buttocks  Location Orientation: Right;Left   Staging: Deep Tissue Pressure Injury - Purple or maroon localized area of discolored intact skin or blood-filled blister due to damage of underlying soft tissue from pressure and/or shear.  Wound Description (Comments): red nonblanchable area to sacrum and buttocks with purple are in the center, under right buttock a 0.5x0.5 open area  Present on Admission: Yes  Dressing Type Foam - Lift dressing to assess site every shift 10/09/23 1952    Consultants:  None  Procedures:  None  Antimicrobials:  Anti-infectives (From admission, onward)    Start     Dose/Rate Route Frequency Ordered Stop   10/01/23 1600  cefTRIAXone  (ROCEPHIN ) 2 g in sodium chloride  0.9 % 100 mL IVPB        2 g 200 mL/hr over 30 Minutes Intravenous Every 24 hours 09/30/23 1940 10/09/23 1611   09/30/23 2030  cefTRIAXone  (ROCEPHIN ) 1 g in sodium chloride  0.9 % 100 mL IVPB        1 g 200 mL/hr over 30 Minutes Intravenous  Once 09/30/23 1940 10/01/23 1416   09/30/23 1600  cefTRIAXone  (ROCEPHIN ) 1 g in sodium chloride  0.9 % 100 mL IVPB  Status:  Discontinued        1 g 200 mL/hr over 30 Minutes Intravenous Every 24 hours 09/29/23 2318 09/30/23 1940   09/30/23 1000  remdesivir  100 mg in sodium chloride  0.9 % 100 mL IVPB  Status:  Discontinued       Placed in Followed by Linked Group   100 mg 200 mL/hr over 30 Minutes Intravenous Daily 09/29/23 2317 09/29/23 2318   09/30/23 1000  remdesivir  100 mg in sodium chloride  0.9 % 100 mL IVPB       Placed in Followed by Linked Group   100 mg 200 mL/hr over 30 Minutes Intravenous Daily 09/29/23 2319 10/01/23 0902   09/29/23 2348  remdesivir  100 mg in sodium chloride  0.9 %  100 mL IVPB       Placed in Followed by Linked Group   100 mg 200 mL/hr over 30 Minutes Intravenous  Once 09/29/23 2319 09/30/23 0222   09/29/23 2330  remdesivir  100 mg in sodium chloride  0.9 % 100 mL IVPB       Placed in Followed by Linked Group   100 mg 200 mL/hr over 30 Minutes Intravenous  Once  09/29/23 2319 09/30/23 0118   09/29/23 2315  remdesivir  200 mg in sodium chloride  0.9% 250 mL IVPB  Status:  Discontinued       Placed in Followed by Linked Group   200 mg 580 mL/hr over 30 Minutes Intravenous Once 09/29/23 2317 09/29/23 2318   09/29/23 1515  cefTRIAXone  (ROCEPHIN ) 1 g in sodium chloride  0.9 % 100 mL IVPB        1 g 200 mL/hr over 30 Minutes Intravenous  Once 09/29/23 1506 09/29/23 1627   09/29/23 1515  azithromycin  (ZITHROMAX ) 500 mg in sodium chloride  0.9 % 250 mL IVPB        500 mg 250 mL/hr over 60 Minutes Intravenous  Once 09/29/23 1506 09/29/23 1924      Subjective: Patient seen and evaluated this a.m. and is complaining of some restless legs today.  Objective: Vitals:   10/09/23 1436 10/09/23 2029 10/09/23 2106 10/10/23 0725  BP: 99/60  122/70   Pulse: 75  83   Resp: 16  17   Temp: (!) 97.3 F (36.3 C)  98.9 F (37.2 C)   TempSrc: Oral  Oral   SpO2: 91% 93% 93% 91%  Weight:      Height:        Intake/Output Summary (Last 24 hours) at 10/10/2023 1034 Last data filed at 10/10/2023 0900 Gross per 24 hour  Intake 600 ml  Output 1000 ml  Net -400 ml   Filed Weights   09/29/23 1331  Weight: 50.8 kg    Examination: General exam: Alert, awake, oriented x 3; doing better overall and demonstrated improvement in his swallowing.  Some throat discomfort reported. Respiratory system: Positive scattered rhonchi; no significant expiratory wheezing.  No using accessory muscle.  2 L nasal cannula in place with good oxygen  saturation appreciated. Cardiovascular system: Rate controlled, no rubs, no gallops, no JVD. Gastrointestinal system: Abdomen is nondistended, soft and nontender.  Positive bowel sounds appreciated.  Nephrostomy tube and suprapubic catheter in place without signs of superimposed infection in the area.  No drainage. Central nervous system: Generally weak; no new focal neurological deficits.  Patient with chronic residual left-sided weakness from  previous stroke. Extremities: No cyanosis or clubbing. Skin: No petechiae. Psychiatry: Flat affect appreciated on exam.  Data Reviewed: I have personally reviewed following labs and imaging studies  CBC: Recent Labs  Lab 10/05/23 0458 10/08/23 0749 10/09/23 0400  WBC 19.2* 27.4* 19.6*  NEUTROABS  --   --  17.5*  HGB 11.2* 12.5* 10.2*  HCT 34.5* 41.2 33.0*  MCV 92.2 97.9 94.3  PLT 220 244 207   Basic Metabolic Panel: Recent Labs  Lab 10/04/23 0340 10/05/23 0458 10/08/23 0749 10/09/23 0400  NA 147* 143 142 136  K 4.8 5.1 4.8 4.6  CL 118* 118* 112* 112*  CO2 21* 17* 18* 19*  GLUCOSE 175* 153* 96 97  BUN 63* 69* 66* 56*  CREATININE 2.04* 2.07* 1.93* 1.91*  CALCIUM  8.1* 8.1* 8.0* 7.6*  MG  --  2.2 2.5* 2.1   GFR: Estimated Creatinine Clearance: 22.2 mL/min (A) (by  C-G formula based on SCr of 1.91 mg/dL (H)).  Liver Function Tests: No results for input(s): AST, ALT, ALKPHOS, BILITOT, PROT, ALBUMIN in the last 168 hours.  No results for input(s): LIPASE, AMYLASE in the last 168 hours.  CBG: No results for input(s): GLUCAP in the last 168 hours.  Sepsis Labs: Recent Labs  Lab 10/08/23 0749  PROCALCITON 0.23    Recent Results (from the past 240 hours)  Culture, blood (Routine X 2) w Reflex to ID Panel     Status: None   Collection Time: 10/01/23  4:35 AM   Specimen: BLOOD  Result Value Ref Range Status   Specimen Description BLOOD LEFT WRIST  Final   Special Requests   Final    BOTTLES DRAWN AEROBIC AND ANAEROBIC Blood Culture adequate volume   Culture   Final    NO GROWTH 5 DAYS Performed at [facility], [address] [place], [place] [address]    Report Status 10/06/2023 FINAL  Final  Culture, blood (Routine X 2) w Reflex to ID Panel     Status: None   Collection Time: 10/01/23  4:35 AM   Specimen: BLOOD  Result Value Ref Range Status   Specimen Description BLOOD LEFT HAND  Final   Special Requests   Final    BOTTLES DRAWN AEROBIC  AND ANAEROBIC Blood Culture adequate volume   Culture   Final    NO GROWTH 5 DAYS Performed at [facility], [address] [place], [place] [address]    Report Status 10/06/2023 FINAL  Final    Radiology Studies: No results found.  Scheduled Meds:  aspirin   325 mg Oral Daily   budesonide  (PULMICORT ) nebulizer solution  0.5 mg Nebulization BID   clopidogrel   75 mg Oral Daily   diazepam   5 mg Oral BID   feeding supplement  237 mL Oral BID BM   gabapentin   100 mg Oral BID   heparin   5,000 Units Subcutaneous Q8H   HYDROcodone -acetaminophen   1 tablet Oral BID   ipratropium-albuterol   3 mL Nebulization BID   levothyroxine   75 mcg Oral q AM   mirabegron  ER  50 mg Oral Daily   multivitamin with minerals  1 tablet Oral Daily   pantoprazole   40 mg Oral Daily   thiamine   100 mg Oral Daily     LOS: 11 days    Time spent: 35 minutes    Shanin Szymanowski D Tyrea Froberg,DO [facility]  If 7PM-7AM, please contact night-coverage [email or web address] 10/10/2023, 10:34 AM

## 2023-10-10 NOTE — Plan of Care (Signed)
  Problem: Education: Goal: Knowledge of risk factors and measures for prevention of condition will improve Outcome: Progressing   Problem: Coping: Goal: Psychosocial and spiritual needs will be supported Outcome: Progressing   Problem: Respiratory: Goal: Will maintain a patent airway Outcome: Progressing Goal: Complications related to the disease process, condition or treatment will be avoided or minimized Outcome: Progressing   Problem: Education: Goal: Knowledge of General Education information will improve Description: Including pain rating scale, medication(s)/side effects and non-pharmacologic comfort measures Outcome: Progressing   Problem: Health Behavior/Discharge Planning: Goal: Ability to manage health-related needs will improve Outcome: Progressing   Problem: Clinical Measurements: Goal: Ability to maintain clinical measurements within normal limits will improve Outcome: Progressing Goal: Will remain free from infection Outcome: Progressing Goal: Diagnostic test results will improve Outcome: Progressing Goal: Respiratory complications will improve Outcome: Progressing Goal: Cardiovascular complication will be avoided Outcome: Progressing   Problem: Activity: Goal: Risk for activity intolerance will decrease Outcome: Progressing   Problem: Nutrition: Goal: Adequate nutrition will be maintained Outcome: Progressing   Problem: Coping: Goal: Level of anxiety will decrease Outcome: Progressing   Problem: Elimination: Goal: Will not experience complications related to bowel motility Outcome: Progressing   Problem: Pain Management: Goal: General experience of comfort will improve Outcome: Progressing   Problem: Safety: Goal: Ability to remain free from injury will improve Outcome: Progressing   Problem: Skin Integrity: Goal: Risk for impaired skin integrity will decrease Outcome: Progressing   Problem: Education: Goal: Knowledge of disease or  condition will improve Outcome: Progressing Goal: Knowledge of secondary prevention will improve (MUST DOCUMENT ALL) Outcome: Progressing Goal: Knowledge of patient specific risk factors will improve Alonso N/A or DELETE if not current risk factor) Outcome: Progressing   Problem: Ischemic Stroke/TIA Tissue Perfusion: Goal: Complications of ischemic stroke/TIA will be minimized Outcome: Progressing   Problem: Coping: Goal: Will verbalize positive feelings about self Outcome: Progressing Goal: Will identify appropriate support needs Outcome: Progressing   Problem: Health Behavior/Discharge Planning: Goal: Ability to manage health-related needs will improve Outcome: Progressing Goal: Goals will be collaboratively established with patient/family Outcome: Progressing   Problem: Self-Care: Goal: Ability to participate in self-care as condition permits will improve Outcome: Progressing Goal: Verbalization of feelings and concerns over difficulty with self-care will improve Outcome: Progressing Goal: Ability to communicate needs accurately will improve Outcome: Progressing   Problem: Nutrition: Goal: Risk of aspiration will decrease Outcome: Progressing Goal: Dietary intake will improve Outcome: Progressing   Problem: Education: Goal: Knowledge of disease or condition will improve Outcome: Progressing Goal: Knowledge of secondary prevention will improve (MUST DOCUMENT ALL) Outcome: Progressing Goal: Knowledge of patient specific risk factors will improve Alonso N/A or DELETE if not current risk factor) Outcome: Progressing   Problem: Ischemic Stroke/TIA Tissue Perfusion: Goal: Complications of ischemic stroke/TIA will be minimized Outcome: Progressing   Problem: Coping: Goal: Will verbalize positive feelings about self Outcome: Progressing Goal: Will identify appropriate support needs Outcome: Progressing   Problem: Health Behavior/Discharge Planning: Goal: Ability to  manage health-related needs will improve Outcome: Progressing Goal: Goals will be collaboratively established with patient/family Outcome: Progressing   Problem: Self-Care: Goal: Ability to participate in self-care as condition permits will improve Outcome: Progressing Goal: Verbalization of feelings and concerns over difficulty with self-care will improve Outcome: Progressing Goal: Ability to communicate needs accurately will improve Outcome: Progressing   Problem: Nutrition: Goal: Risk of aspiration will decrease Outcome: Progressing Goal: Dietary intake will improve Outcome: Progressing

## 2023-10-10 NOTE — Plan of Care (Signed)
 Problem: Education: Goal: Knowledge of risk factors and measures for prevention of condition will improve 10/10/2023 1009 by Carolee Dagoberto CROME, LPN Outcome: Progressing 10/10/2023 1009 by Carolee Dagoberto CROME, LPN Outcome: Progressing   Problem: Coping: Goal: Psychosocial and spiritual needs will be supported 10/10/2023 1009 by Carolee Dagoberto CROME, LPN Outcome: Progressing 10/10/2023 1009 by Carolee Dagoberto CROME, LPN Outcome: Progressing   Problem: Respiratory: Goal: Will maintain a patent airway 10/10/2023 1009 by Carolee Dagoberto CROME, LPN Outcome: Progressing 10/10/2023 1009 by Carolee Dagoberto CROME, LPN Outcome: Progressing Goal: Complications related to the disease process, condition or treatment will be avoided or minimized 10/10/2023 1009 by Carolee Dagoberto CROME, LPN Outcome: Progressing 10/10/2023 1009 by Carolee Dagoberto CROME, LPN Outcome: Progressing   Problem: Education: Goal: Knowledge of General Education information will improve Description: Including pain rating scale, medication(s)/side effects and non-pharmacologic comfort measures 10/10/2023 1009 by Carolee Dagoberto CROME, LPN Outcome: Progressing 10/10/2023 1009 by Carolee Dagoberto CROME, LPN Outcome: Progressing   Problem: Health Behavior/Discharge Planning: Goal: Ability to manage health-related needs will improve 10/10/2023 1009 by Carolee Dagoberto CROME, LPN Outcome: Progressing 10/10/2023 1009 by Carolee Dagoberto CROME, LPN Outcome: Progressing   Problem: Clinical Measurements: Goal: Ability to maintain clinical measurements within normal limits will improve 10/10/2023 1009 by Carolee Dagoberto CROME, LPN Outcome: Progressing 10/10/2023 1009 by Carolee Dagoberto CROME, LPN Outcome: Progressing Goal: Will remain free from infection 10/10/2023 1009 by Carolee Dagoberto CROME, LPN Outcome: Progressing 10/10/2023 1009 by Carolee Dagoberto CROME, LPN Outcome: Progressing Goal: Diagnostic test results will improve 10/10/2023 1009 by Carolee Dagoberto CROME, LPN Outcome: Progressing 10/10/2023 1009 by Carolee Dagoberto CROME, LPN Outcome: Progressing Goal: Respiratory  complications will improve 10/10/2023 1009 by Carolee Dagoberto CROME, LPN Outcome: Progressing 10/10/2023 1009 by Carolee Dagoberto CROME, LPN Outcome: Progressing Goal: Cardiovascular complication will be avoided 10/10/2023 1009 by Carolee Dagoberto CROME, LPN Outcome: Progressing 10/10/2023 1009 by Carolee Dagoberto CROME, LPN Outcome: Progressing   Problem: Activity: Goal: Risk for activity intolerance will decrease 10/10/2023 1009 by Carolee Dagoberto CROME, LPN Outcome: Progressing 10/10/2023 1009 by Carolee Dagoberto CROME, LPN Outcome: Progressing   Problem: Nutrition: Goal: Adequate nutrition will be maintained 10/10/2023 1009 by Carolee Dagoberto CROME, LPN Outcome: Progressing 10/10/2023 1009 by Carolee Dagoberto CROME, LPN Outcome: Progressing   Problem: Coping: Goal: Level of anxiety will decrease 10/10/2023 1009 by Carolee Dagoberto CROME, LPN Outcome: Progressing 10/10/2023 1009 by Carolee Dagoberto CROME, LPN Outcome: Progressing   Problem: Elimination: Goal: Will not experience complications related to bowel motility 10/10/2023 1009 by Carolee Dagoberto CROME, LPN Outcome: Progressing 10/10/2023 1009 by Carolee Dagoberto CROME, LPN Outcome: Progressing   Problem: Pain Management: Goal: General experience of comfort will improve 10/10/2023 1009 by Carolee Dagoberto CROME, LPN Outcome: Progressing 10/10/2023 1009 by Carolee Dagoberto CROME, LPN Outcome: Progressing   Problem: Safety: Goal: Ability to remain free from injury will improve 10/10/2023 1009 by Carolee Dagoberto CROME, LPN Outcome: Progressing 10/10/2023 1009 by Carolee Dagoberto CROME, LPN Outcome: Progressing   Problem: Skin Integrity: Goal: Risk for impaired skin integrity will decrease 10/10/2023 1009 by Carolee Dagoberto CROME, LPN Outcome: Progressing 10/10/2023 1009 by Carolee Dagoberto CROME, LPN Outcome: Progressing   Problem: Education: Goal: Knowledge of disease or condition will improve 10/10/2023 1009 by Carolee Dagoberto CROME, LPN Outcome: Progressing 10/10/2023 1009 by Carolee Dagoberto CROME, LPN Outcome: Progressing Goal: Knowledge of secondary prevention will improve (MUST DOCUMENT  ALL) 10/10/2023 1009 by Carolee Dagoberto CROME, LPN Outcome: Progressing 10/10/2023 1009 by Carolee Dagoberto CROME, LPN Outcome: Progressing Goal: Knowledge of patient specific risk  factors will improve Alonso N/A or DELETE if not current risk factor) 10/10/2023 1009 by Carolee Dagoberto CROME, LPN Outcome: Progressing 10/10/2023 1009 by Carolee Dagoberto CROME, LPN Outcome: Progressing   Problem: Ischemic Stroke/TIA Tissue Perfusion: Goal: Complications of ischemic stroke/TIA will be minimized 10/10/2023 1009 by Carolee Dagoberto CROME, LPN Outcome: Progressing 10/10/2023 1009 by Carolee Dagoberto CROME, LPN Outcome: Progressing   Problem: Coping: Goal: Will verbalize positive feelings about self 10/10/2023 1009 by Carolee Dagoberto CROME, LPN Outcome: Progressing 10/10/2023 1009 by Carolee Dagoberto CROME, LPN Outcome: Progressing Goal: Will identify appropriate support needs 10/10/2023 1009 by Carolee Dagoberto CROME, LPN Outcome: Progressing 10/10/2023 1009 by Carolee Dagoberto CROME, LPN Outcome: Progressing   Problem: Health Behavior/Discharge Planning: Goal: Ability to manage health-related needs will improve 10/10/2023 1009 by Carolee Dagoberto CROME, LPN Outcome: Progressing 10/10/2023 1009 by Carolee Dagoberto CROME, LPN Outcome: Progressing Goal: Goals will be collaboratively established with patient/family 10/10/2023 1009 by Carolee Dagoberto CROME, LPN Outcome: Progressing 10/10/2023 1009 by Carolee Dagoberto CROME, LPN Outcome: Progressing   Problem: Self-Care: Goal: Ability to participate in self-care as condition permits will improve 10/10/2023 1009 by Carolee Dagoberto CROME, LPN Outcome: Progressing 10/10/2023 1009 by Carolee Dagoberto CROME, LPN Outcome: Progressing Goal: Verbalization of feelings and concerns over difficulty with self-care will improve 10/10/2023 1009 by Carolee Dagoberto CROME, LPN Outcome: Progressing 10/10/2023 1009 by Carolee Dagoberto CROME, LPN Outcome: Progressing Goal: Ability to communicate needs accurately will improve 10/10/2023 1009 by Carolee Dagoberto CROME, LPN Outcome: Progressing 10/10/2023 1009 by Carolee Dagoberto CROME, LPN Outcome:  Progressing   Problem: Nutrition: Goal: Risk of aspiration will decrease 10/10/2023 1009 by Carolee Dagoberto CROME, LPN Outcome: Progressing 10/10/2023 1009 by Carolee Dagoberto CROME, LPN Outcome: Progressing Goal: Dietary intake will improve 10/10/2023 1009 by Carolee Dagoberto CROME, LPN Outcome: Progressing 10/10/2023 1009 by Carolee Dagoberto CROME, LPN Outcome: Progressing   Problem: Education: Goal: Knowledge of disease or condition will improve 10/10/2023 1009 by Carolee Dagoberto CROME, LPN Outcome: Progressing 10/10/2023 1009 by Carolee Dagoberto CROME, LPN Outcome: Progressing Goal: Knowledge of secondary prevention will improve (MUST DOCUMENT ALL) 10/10/2023 1009 by Carolee Dagoberto CROME, LPN Outcome: Progressing 10/10/2023 1009 by Carolee Dagoberto CROME, LPN Outcome: Progressing Goal: Knowledge of patient specific risk factors will improve Alonso N/A or DELETE if not current risk factor) 10/10/2023 1009 by Carolee Dagoberto CROME, LPN Outcome: Progressing 10/10/2023 1009 by Carolee Dagoberto CROME, LPN Outcome: Progressing   Problem: Ischemic Stroke/TIA Tissue Perfusion: Goal: Complications of ischemic stroke/TIA will be minimized 10/10/2023 1009 by Carolee Dagoberto CROME, LPN Outcome: Progressing 10/10/2023 1009 by Carolee Dagoberto CROME, LPN Outcome: Progressing   Problem: Coping: Goal: Will verbalize positive feelings about self 10/10/2023 1009 by Carolee Dagoberto CROME, LPN Outcome: Progressing 10/10/2023 1009 by Carolee Dagoberto CROME, LPN Outcome: Progressing Goal: Will identify appropriate support needs 10/10/2023 1009 by Carolee Dagoberto CROME, LPN Outcome: Progressing 10/10/2023 1009 by Carolee Dagoberto CROME, LPN Outcome: Progressing   Problem: Health Behavior/Discharge Planning: Goal: Ability to manage health-related needs will improve 10/10/2023 1009 by Carolee Dagoberto CROME, LPN Outcome: Progressing 10/10/2023 1009 by Carolee Dagoberto CROME, LPN Outcome: Progressing Goal: Goals will be collaboratively established with patient/family 10/10/2023 1009 by Carolee Dagoberto CROME, LPN Outcome: Progressing 10/10/2023 1009 by Carolee Dagoberto CROME,  LPN Outcome: Progressing   Problem: Self-Care: Goal: Ability to participate in self-care as condition permits will improve 10/10/2023 1009 by Carolee Dagoberto CROME, LPN Outcome: Progressing 10/10/2023 1009 by Carolee Dagoberto CROME, LPN Outcome: Progressing Goal: Verbalization of feelings and concerns over difficulty with self-care will improve 10/10/2023  1009 by Carolee Dagoberto CROME, LPN Outcome: Progressing 10/10/2023 1009 by Carolee Dagoberto CROME, LPN Outcome: Progressing Goal: Ability to communicate needs accurately will improve 10/10/2023 1009 by Carolee Dagoberto CROME, LPN Outcome: Progressing 10/10/2023 1009 by Carolee Dagoberto CROME, LPN Outcome: Progressing   Problem: Nutrition: Goal: Risk of aspiration will decrease 10/10/2023 1009 by Carolee Dagoberto CROME, LPN Outcome: Progressing 10/10/2023 1009 by Carolee Dagoberto CROME, LPN Outcome: Progressing Goal: Dietary intake will improve 10/10/2023 1009 by Carolee Dagoberto CROME, LPN Outcome: Progressing 10/10/2023 1009 by Carolee Dagoberto CROME, LPN Outcome: Progressing

## 2023-10-10 NOTE — Progress Notes (Signed)
   10/10/23 1429  Vitals  BP (!) 92/51  MAP (mmHg) (!) 64  BP Location Left Arm  BP Method Automatic  Patient Position (if appropriate) Sitting  Pulse Rate 79  Pulse Rate Source Monitor  Resp 18  Level of Consciousness  Level of Consciousness Alert  MEWS COLOR  MEWS Score Color Green  Oxygen  Therapy  SpO2 96 %  O2 Device Room Air  Pain Assessment  Pain Scale 0-10  Pain Score 0  PCA/Epidural/Spinal Assessment  Respiratory Pattern Regular;Unlabored  Glasgow Coma Scale  Eye Opening 4  Best Verbal Response (NON-intubated) 5  Best Motor Response 6  Glasgow Coma Scale Score 15  MEWS Score  MEWS Temp 0  MEWS Systolic 1  MEWS Pulse 0  MEWS RR 0  MEWS LOC 0  MEWS Score 1  Provider Notification  Provider Name/Title dr. maree  Date Provider Notified 10/09/23  Time Provider Notified 1518  Method of Notification Page  Notification Reason Other (Comment)  Provider response No new orders  Date of Provider Response 10/10/23  Time of Provider Response 1518  Expected discharge  Expected discharge date 10/12/23   Yellow mews color change respiration rechecked. Within normal range

## 2023-10-11 DIAGNOSIS — J1282 Pneumonia due to coronavirus disease 2019: Secondary | ICD-10-CM | POA: Diagnosis not present

## 2023-10-11 DIAGNOSIS — U071 COVID-19: Secondary | ICD-10-CM | POA: Diagnosis not present

## 2023-10-11 NOTE — Plan of Care (Signed)
  Problem: Education: Goal: Knowledge of risk factors and measures for prevention of condition will improve 10/11/2023 2057 by Rumalda Fraise, RN Outcome: Not Progressing 10/11/2023 1929 by Rumalda Fraise, RN Outcome: Progressing   Problem: Health Behavior/Discharge Planning: Goal: Ability to manage health-related needs will improve Outcome: Not Progressing   Problem: Clinical Measurements: Goal: Ability to maintain clinical measurements within normal limits will improve Outcome: Progressing   Problem: Education: Goal: Knowledge of disease or condition will improve Outcome: Not Progressing Goal: Knowledge of secondary prevention will improve (MUST DOCUMENT ALL) Outcome: Not Progressing

## 2023-10-11 NOTE — Progress Notes (Signed)
   10/11/23 1227  Vitals  Temp 98.1 F (36.7 C)  BP (!) 96/52  MAP (mmHg) 65  BP Location Left Arm  BP Method Automatic  Patient Position (if appropriate) Sitting  Pulse Rate 80  Pulse Rate Source Monitor  Resp 17  MEWS COLOR  MEWS Score Color Green  Oxygen  Therapy  SpO2 96 %  O2 Device Nasal Cannula  O2 Flow Rate (L/min) 3 L/min  MEWS Score  MEWS Temp 0  MEWS Systolic 1  MEWS Pulse 0  MEWS RR 0  MEWS LOC 0  MEWS Score 1   MD Maree notified.

## 2023-10-11 NOTE — Plan of Care (Signed)
  Problem: Education: Goal: Knowledge of risk factors and measures for prevention of condition will improve Outcome: Progressing   Problem: Health Behavior/Discharge Planning: Goal: Ability to manage health-related needs will improve Outcome: Not Progressing   Problem: Clinical Measurements: Goal: Ability to maintain clinical measurements within normal limits will improve Outcome: Progressing

## 2023-10-11 NOTE — Progress Notes (Signed)
 PROGRESS NOTE    Jordan Ward  FMW:[id number] DOB: 1940-12-12 DOA: 09/29/2023 PCP: Shona Norleen PEDLAR, MD   Brief Narrative:    Jordan Ward, an 83 y/o with multiple co-morbidities including, MS, CKD4, neurogenic bladder with superpubic cystostomy catheter, obstructed left ureter with nephrostomy tube, COPD/Emphysema on home oxygen , hypothyroidism on replacement, dysphagia, non-healing wound right ankle in setting of PAD, HFpEF, cerebral vascular disease. Over the past several days he has been weaker, more short of breath, coughing, left-sided LE weakness, increased slurred speech.  Patient was admitted with COVID-pneumonia as well as significant cerebrovascular disease with small, acute CVA.  He is now noted to have strep pneumonia bacteremia as well.  Now awaiting SNF placement by 1/13.  Assessment & Plan:   Principal Problem:   Pneumonia due to COVID-19 virus Active Problems:   UTI (urinary tract infection)   Cerebrovascular disease   Chronic diastolic (congestive) heart failure (HCC)   CKD (chronic kidney disease) stage 4, GFR 15-29 ml/min (HCC)   Nephrostomy present (HCC)   Emphysema lung (HCC)   Multiple sclerosis (HCC)   Chronic suprapubic catheter (HCC)   Dysphagia   Hypothyroidism   Dilated cardiomyopathy (HCC)   Essential hypertension   COVID-19  Assessment and Plan:   Pneumonia due to COVID-19 virus - remdesivir  infusions has been completed. -Continue treatment with steroids, mucolytic's, flutter valve and bronchodilator management -Will continue supportive care and follow clinical response. -Needs isolation and quarantine through 1/13 until SNF can accept him. -TOC on more and assisting with placement. -Increase leukocytosis, monitor CBC with differential in a.m.   Cerebrovascular disease with small ischemic CVA -Complete stroke workup -Follow neurology service recommendation -Prior to admission using aspirin  for secondary prevention -Patient out of the window for any  thrombolytic management or intervention. -Will provide treatment with aspirin  and Plavix  for secondary prevention. -Per PT/OT recommendations patient will need a skilled nursing facility at discharge -LDL 16 -TOC aware and helping with placement.  Hypernatremia-improved -Complete D5 fluid and resume as needed -Monitor repeat labs in a.m.   Strep pneumonia bacteremia -Associated with chronic suprapubic catheter -Patient has completed 10-day course of Rocephin    Emphysema lung (HCC) -Continue oxygen  supplementation -Continue bronchodilator management -Receiving steroids as part of treatment for COVID -Follow clinical response and continue supportive care.   Nephrostomy present (HCC) -Continue nephrostomy tube care -Continue current antibiotic for UTI -Continue patient follow-up with urology service.   CKD (chronic kidney disease) stage 4, GFR 15-29 ml/min (HCC) -Continue to follow renal function trend -Continue minimizing nephrotoxic agents and avoid the use of contrast and hypotension. -Good urine output currently appreciated.   Chronic diastolic (congestive) heart failure (HCC) -Elevated BNP was present at time of admission -Some decreased breath sounds reported by admitting doctor patient's lung bases. -Provided on 09/30/2023 -Currently appears to be euvolemic; will hold on any further diuresis for the moment. -Follow daily weights and urine output -Low-sodium diet discussed with patient -Gentle IV fluid initiated as noted above   Essential hypertension -Stable for the most part -Continue to follow vital signs.   Dilated cardiomyopathy (HCC) -2D echocardiogram with LVEF 30-35% with global hypokinesis and grade 1 diastolic dysfunction -No significant signs of fluid overload on exam. -Continue to follow daily weights and strict I's and O's.   Hypothyroidism -TSH elevated -Free T4 1.07 -Continue Synthroid .   Dysphagia -Long standing problem which he attributes to MS.  Now with evidence of new CVA -Appreciate assistance and recommendation by speech therapy; further improvement in his swallowing  capacity appreciated; following recommendation by speech therapy (10/02/2023) diet has been upgraded to dysphagia 3 with thin liquids.   Chronic suprapubic catheter (HCC) -Catheter site appears clean and normal -No erythematous changes or drainage -Continue current IV antibiotics -Continue local care. -Continue patient follow-up with urology service. -Resumed home Myrbetriq    Multiple sclerosis (HCC) -No receiving any medications at the moment -Continue outpatient follow-up with neurology. -Noted to have restless legs, start gabapentin  1/11  DVT prophylaxis:Heparin  Code Status: Full Family Communication: Discussed with wife at bedside 1/4 Disposition Plan:  Status is: Inpatient Remains inpatient appropriate because: Need for placement and IV antibiotics.  Skin Assessment:  I have examined the patient's skin and I agree with the wound assessment as performed by the wound care RN as outlined below:  Pressure Injury 09/30/23 Ankle Left;Lateral Stage 4 - Full thickness tissue loss with exposed bone, tendon or muscle. 1x1cm open area to outside of right ankle, (Active)  09/30/23 1301  Location: Ankle  Location Orientation: Left;Lateral  Staging: Stage 4 - Full thickness tissue loss with exposed bone, tendon or muscle.  Wound Description (Comments): 1x1cm open area to outside of right ankle,  Present on Admission: Yes  Dressing Type Foam - Lift dressing to assess site every shift 10/10/23 2051     Pressure Injury 09/30/23 Buttocks Right;Left Deep Tissue Pressure Injury - Purple or maroon localized area of discolored intact skin or blood-filled blister due to damage of underlying soft tissue from pressure and/or shear. red nonblanchable area to sac (Active)  09/30/23 1130  Location: Buttocks  Location Orientation: Right;Left  Staging: Deep Tissue Pressure  Injury - Purple or maroon localized area of discolored intact skin or blood-filled blister due to damage of underlying soft tissue from pressure and/or shear.  Wound Description (Comments): red nonblanchable area to sacrum and buttocks with purple are in the center, under right buttock a 0.5x0.5 open area  Present on Admission: Yes  Dressing Type Foam - Lift dressing to assess site every shift 10/10/23 2051    Consultants:  None  Procedures:  None  Antimicrobials:  Anti-infectives (From admission, onward)    Start     Dose/Rate Route Frequency Ordered Stop   10/01/23 1600  cefTRIAXone  (ROCEPHIN ) 2 g in sodium chloride  0.9 % 100 mL IVPB        2 g 200 mL/hr over 30 Minutes Intravenous Every 24 hours 09/30/23 1940 10/09/23 1611   09/30/23 2030  cefTRIAXone  (ROCEPHIN ) 1 g in sodium chloride  0.9 % 100 mL IVPB        1 g 200 mL/hr over 30 Minutes Intravenous  Once 09/30/23 1940 10/01/23 1416   09/30/23 1600  cefTRIAXone  (ROCEPHIN ) 1 g in sodium chloride  0.9 % 100 mL IVPB  Status:  Discontinued        1 g 200 mL/hr over 30 Minutes Intravenous Every 24 hours 09/29/23 2318 09/30/23 1940   09/30/23 1000  remdesivir  100 mg in sodium chloride  0.9 % 100 mL IVPB  Status:  Discontinued       Placed in Followed by Linked Group   100 mg 200 mL/hr over 30 Minutes Intravenous Daily 09/29/23 2317 09/29/23 2318   09/30/23 1000  remdesivir  100 mg in sodium chloride  0.9 % 100 mL IVPB       Placed in Followed by Linked Group   100 mg 200 mL/hr over 30 Minutes Intravenous Daily 09/29/23 2319 10/01/23 0902   09/29/23 2348  remdesivir  100 mg in sodium chloride  0.9 % 100 mL IVPB  Placed in Followed by Linked Group   100 mg 200 mL/hr over 30 Minutes Intravenous  Once 09/29/23 2319 09/30/23 0222   09/29/23 2330  remdesivir  100 mg in sodium chloride  0.9 % 100 mL IVPB       Placed in Followed by Linked Group   100 mg 200 mL/hr over 30 Minutes Intravenous  Once 09/29/23 2319 09/30/23 0118    09/29/23 2315  remdesivir  200 mg in sodium chloride  0.9% 250 mL IVPB  Status:  Discontinued       Placed in Followed by Linked Group   200 mg 580 mL/hr over 30 Minutes Intravenous Once 09/29/23 2317 09/29/23 2318   09/29/23 1515  cefTRIAXone  (ROCEPHIN ) 1 g in sodium chloride  0.9 % 100 mL IVPB        1 g 200 mL/hr over 30 Minutes Intravenous  Once 09/29/23 1506 09/29/23 1627   09/29/23 1515  azithromycin  (ZITHROMAX ) 500 mg in sodium chloride  0.9 % 250 mL IVPB        500 mg 250 mL/hr over 60 Minutes Intravenous  Once 09/29/23 1506 09/29/23 1924      Subjective: Patient seen and evaluated this a.m. with no complaints or concerns noted.  Objective: Vitals:   10/10/23 1926 10/10/23 2036 10/11/23 0414 10/11/23 0730  BP:  115/73 (!) 111/59   Pulse:  80 71   Resp:  20 18   Temp:  98.3 F (36.8 C) 99.5 F (37.5 C)   TempSrc:  Oral Oral   SpO2: 92% 97% 94% 94%  Weight:      Height:        Intake/Output Summary (Last 24 hours) at 10/11/2023 0959 Last data filed at 10/11/2023 0416 Gross per 24 hour  Intake 360 ml  Output 1000 ml  Net -640 ml   Filed Weights   09/29/23 1331  Weight: 50.8 kg    Examination: General exam: Alert, awake, oriented x 3; doing better overall and demonstrated improvement in his swallowing.  Some throat discomfort reported. Respiratory system: Positive scattered rhonchi; no significant expiratory wheezing.  No using accessory muscle.  2 L nasal cannula in place with good oxygen  saturation appreciated. Cardiovascular system: Rate controlled, no rubs, no gallops, no JVD. Gastrointestinal system: Abdomen is nondistended, soft and nontender.  Positive bowel sounds appreciated.  Nephrostomy tube and suprapubic catheter in place without signs of superimposed infection in the area.  No drainage. Central nervous system: Generally weak; no new focal neurological deficits.  Patient with chronic residual left-sided weakness from previous stroke. Extremities: No  cyanosis or clubbing. Skin: No petechiae. Psychiatry: Flat affect appreciated on exam.  Data Reviewed: I have personally reviewed following labs and imaging studies  CBC: Recent Labs  Lab 10/05/23 0458 10/08/23 0749 10/09/23 0400  WBC 19.2* 27.4* 19.6*  NEUTROABS  --   --  17.5*  HGB 11.2* 12.5* 10.2*  HCT 34.5* 41.2 33.0*  MCV 92.2 97.9 94.3  PLT 220 244 207   Basic Metabolic Panel: Recent Labs  Lab 10/05/23 0458 10/08/23 0749 10/09/23 0400  NA 143 142 136  K 5.1 4.8 4.6  CL 118* 112* 112*  CO2 17* 18* 19*  GLUCOSE 153* 96 97  BUN 69* 66* 56*  CREATININE 2.07* 1.93* 1.91*  CALCIUM  8.1* 8.0* 7.6*  MG 2.2 2.5* 2.1   GFR: Estimated Creatinine Clearance: 22.2 mL/min (A) (by C-G formula based on SCr of 1.91 mg/dL (H)).  Liver Function Tests: No results for input(s): AST, ALT, ALKPHOS, BILITOT, PROT, ALBUMIN in  the last 168 hours.  No results for input(s): LIPASE, AMYLASE in the last 168 hours.  CBG: No results for input(s): GLUCAP in the last 168 hours.  Sepsis Labs: Recent Labs  Lab 10/08/23 0749  PROCALCITON 0.23    No results found for this or any previous visit (from the past 240 hours).   Radiology Studies: No results found.  Scheduled Meds:  aspirin   325 mg Oral Daily   budesonide  (PULMICORT ) nebulizer solution  0.5 mg Nebulization BID   clopidogrel   75 mg Oral Daily   diazepam   5 mg Oral BID   feeding supplement  237 mL Oral BID BM   gabapentin   100 mg Oral BID   heparin   5,000 Units Subcutaneous Q8H   HYDROcodone -acetaminophen   1 tablet Oral BID   ipratropium-albuterol   3 mL Nebulization BID   levothyroxine   75 mcg Oral q AM   mirabegron  ER  50 mg Oral Daily   multivitamin with minerals  1 tablet Oral Daily   pantoprazole   40 mg Oral Daily   thiamine   100 mg Oral Daily     LOS: 12 days    Time spent: 35 minutes    Jordan Persico D Emeril Stille,DO [facility]  If 7PM-7AM, please contact  night-coverage [email or web address] 10/11/2023, 9:59 AM

## 2023-10-12 ENCOUNTER — Ambulatory Visit: Payer: Self-pay | Admitting: *Deleted

## 2023-10-12 DIAGNOSIS — T83022A Displacement of nephrostomy catheter, initial encounter: Secondary | ICD-10-CM | POA: Diagnosis not present

## 2023-10-12 DIAGNOSIS — S91001A Unspecified open wound, right ankle, initial encounter: Secondary | ICD-10-CM | POA: Diagnosis not present

## 2023-10-12 DIAGNOSIS — Z87891 Personal history of nicotine dependence: Secondary | ICD-10-CM | POA: Diagnosis not present

## 2023-10-12 DIAGNOSIS — B952 Enterococcus as the cause of diseases classified elsewhere: Secondary | ICD-10-CM | POA: Diagnosis not present

## 2023-10-12 DIAGNOSIS — I420 Dilated cardiomyopathy: Secondary | ICD-10-CM | POA: Diagnosis present

## 2023-10-12 DIAGNOSIS — N184 Chronic kidney disease, stage 4 (severe): Secondary | ICD-10-CM | POA: Diagnosis not present

## 2023-10-12 DIAGNOSIS — N390 Urinary tract infection, site not specified: Secondary | ICD-10-CM | POA: Diagnosis not present

## 2023-10-12 DIAGNOSIS — R54 Age-related physical debility: Secondary | ICD-10-CM | POA: Diagnosis not present

## 2023-10-12 DIAGNOSIS — N139 Obstructive and reflux uropathy, unspecified: Secondary | ICD-10-CM | POA: Diagnosis not present

## 2023-10-12 DIAGNOSIS — T83198A Other mechanical complication of other urinary devices and implants, initial encounter: Secondary | ICD-10-CM | POA: Diagnosis not present

## 2023-10-12 DIAGNOSIS — S31829A Unspecified open wound of left buttock, initial encounter: Secondary | ICD-10-CM | POA: Diagnosis not present

## 2023-10-12 DIAGNOSIS — L89519 Pressure ulcer of right ankle, unspecified stage: Secondary | ICD-10-CM | POA: Diagnosis present

## 2023-10-12 DIAGNOSIS — Z1152 Encounter for screening for COVID-19: Secondary | ICD-10-CM | POA: Diagnosis not present

## 2023-10-12 DIAGNOSIS — L89154 Pressure ulcer of sacral region, stage 4: Secondary | ICD-10-CM | POA: Diagnosis present

## 2023-10-12 DIAGNOSIS — I739 Peripheral vascular disease, unspecified: Secondary | ICD-10-CM | POA: Diagnosis not present

## 2023-10-12 DIAGNOSIS — N1832 Chronic kidney disease, stage 3b: Secondary | ICD-10-CM | POA: Diagnosis not present

## 2023-10-12 DIAGNOSIS — T83092A Other mechanical complication of nephrostomy catheter, initial encounter: Secondary | ICD-10-CM | POA: Diagnosis not present

## 2023-10-12 DIAGNOSIS — M62561 Muscle wasting and atrophy, not elsewhere classified, right lower leg: Secondary | ICD-10-CM | POA: Diagnosis not present

## 2023-10-12 DIAGNOSIS — K439 Ventral hernia without obstruction or gangrene: Secondary | ICD-10-CM | POA: Diagnosis not present

## 2023-10-12 DIAGNOSIS — F419 Anxiety disorder, unspecified: Secondary | ICD-10-CM | POA: Diagnosis not present

## 2023-10-12 DIAGNOSIS — R29898 Other symptoms and signs involving the musculoskeletal system: Secondary | ICD-10-CM | POA: Diagnosis not present

## 2023-10-12 DIAGNOSIS — N281 Cyst of kidney, acquired: Secondary | ICD-10-CM | POA: Diagnosis not present

## 2023-10-12 DIAGNOSIS — G35 Multiple sclerosis: Secondary | ICD-10-CM | POA: Diagnosis not present

## 2023-10-12 DIAGNOSIS — N319 Neuromuscular dysfunction of bladder, unspecified: Secondary | ICD-10-CM | POA: Diagnosis not present

## 2023-10-12 DIAGNOSIS — N200 Calculus of kidney: Secondary | ICD-10-CM | POA: Diagnosis not present

## 2023-10-12 DIAGNOSIS — Z792 Long term (current) use of antibiotics: Secondary | ICD-10-CM | POA: Diagnosis not present

## 2023-10-12 DIAGNOSIS — I700 Atherosclerosis of aorta: Secondary | ICD-10-CM | POA: Diagnosis present

## 2023-10-12 DIAGNOSIS — R5381 Other malaise: Secondary | ICD-10-CM | POA: Diagnosis not present

## 2023-10-12 DIAGNOSIS — I130 Hypertensive heart and chronic kidney disease with heart failure and stage 1 through stage 4 chronic kidney disease, or unspecified chronic kidney disease: Secondary | ICD-10-CM | POA: Diagnosis present

## 2023-10-12 DIAGNOSIS — E785 Hyperlipidemia, unspecified: Secondary | ICD-10-CM | POA: Diagnosis not present

## 2023-10-12 DIAGNOSIS — R1310 Dysphagia, unspecified: Secondary | ICD-10-CM | POA: Diagnosis not present

## 2023-10-12 DIAGNOSIS — Z7401 Bed confinement status: Secondary | ICD-10-CM | POA: Diagnosis not present

## 2023-10-12 DIAGNOSIS — Z8673 Personal history of transient ischemic attack (TIA), and cerebral infarction without residual deficits: Secondary | ICD-10-CM | POA: Diagnosis not present

## 2023-10-12 DIAGNOSIS — T83022D Displacement of nephrostomy catheter, subsequent encounter: Secondary | ICD-10-CM | POA: Diagnosis not present

## 2023-10-12 DIAGNOSIS — Z936 Other artificial openings of urinary tract status: Secondary | ICD-10-CM | POA: Diagnosis not present

## 2023-10-12 DIAGNOSIS — J189 Pneumonia, unspecified organism: Secondary | ICD-10-CM | POA: Diagnosis not present

## 2023-10-12 DIAGNOSIS — N132 Hydronephrosis with renal and ureteral calculous obstruction: Secondary | ICD-10-CM | POA: Diagnosis not present

## 2023-10-12 DIAGNOSIS — R109 Unspecified abdominal pain: Secondary | ICD-10-CM | POA: Diagnosis not present

## 2023-10-12 DIAGNOSIS — Z9359 Other cystostomy status: Secondary | ICD-10-CM | POA: Diagnosis not present

## 2023-10-12 DIAGNOSIS — J449 Chronic obstructive pulmonary disease, unspecified: Secondary | ICD-10-CM | POA: Diagnosis not present

## 2023-10-12 DIAGNOSIS — Z436 Encounter for attention to other artificial openings of urinary tract: Secondary | ICD-10-CM | POA: Diagnosis not present

## 2023-10-12 DIAGNOSIS — I10 Essential (primary) hypertension: Secondary | ICD-10-CM | POA: Diagnosis not present

## 2023-10-12 DIAGNOSIS — Z20822 Contact with and (suspected) exposure to covid-19: Secondary | ICD-10-CM | POA: Diagnosis not present

## 2023-10-12 DIAGNOSIS — N1330 Unspecified hydronephrosis: Secondary | ICD-10-CM | POA: Diagnosis not present

## 2023-10-12 DIAGNOSIS — Z79890 Hormone replacement therapy (postmenopausal): Secondary | ICD-10-CM | POA: Diagnosis not present

## 2023-10-12 DIAGNOSIS — I509 Heart failure, unspecified: Secondary | ICD-10-CM | POA: Diagnosis present

## 2023-10-12 DIAGNOSIS — E039 Hypothyroidism, unspecified: Secondary | ICD-10-CM | POA: Diagnosis not present

## 2023-10-12 DIAGNOSIS — J440 Chronic obstructive pulmonary disease with acute lower respiratory infection: Secondary | ICD-10-CM | POA: Diagnosis present

## 2023-10-12 DIAGNOSIS — R059 Cough, unspecified: Secondary | ICD-10-CM | POA: Diagnosis not present

## 2023-10-12 DIAGNOSIS — R7881 Bacteremia: Secondary | ICD-10-CM | POA: Diagnosis present

## 2023-10-12 DIAGNOSIS — R2689 Other abnormalities of gait and mobility: Secondary | ICD-10-CM | POA: Diagnosis not present

## 2023-10-12 DIAGNOSIS — M869 Osteomyelitis, unspecified: Secondary | ICD-10-CM | POA: Diagnosis present

## 2023-10-12 DIAGNOSIS — L89159 Pressure ulcer of sacral region, unspecified stage: Secondary | ICD-10-CM | POA: Diagnosis not present

## 2023-10-12 DIAGNOSIS — J1282 Pneumonia due to coronavirus disease 2019: Secondary | ICD-10-CM | POA: Diagnosis not present

## 2023-10-12 DIAGNOSIS — Z7982 Long term (current) use of aspirin: Secondary | ICD-10-CM | POA: Diagnosis not present

## 2023-10-12 DIAGNOSIS — N99528 Other complication of other external stoma of urinary tract: Secondary | ICD-10-CM | POA: Diagnosis not present

## 2023-10-12 DIAGNOSIS — Z79899 Other long term (current) drug therapy: Secondary | ICD-10-CM | POA: Diagnosis not present

## 2023-10-12 DIAGNOSIS — I6381 Other cerebral infarction due to occlusion or stenosis of small artery: Secondary | ICD-10-CM | POA: Diagnosis not present

## 2023-10-12 DIAGNOSIS — L89150 Pressure ulcer of sacral region, unstageable: Secondary | ICD-10-CM | POA: Diagnosis not present

## 2023-10-12 DIAGNOSIS — I5032 Chronic diastolic (congestive) heart failure: Secondary | ICD-10-CM | POA: Diagnosis not present

## 2023-10-12 DIAGNOSIS — M62562 Muscle wasting and atrophy, not elsewhere classified, left lower leg: Secondary | ICD-10-CM | POA: Diagnosis not present

## 2023-10-12 DIAGNOSIS — R531 Weakness: Secondary | ICD-10-CM | POA: Diagnosis not present

## 2023-10-12 DIAGNOSIS — L89324 Pressure ulcer of left buttock, stage 4: Secondary | ICD-10-CM | POA: Diagnosis not present

## 2023-10-12 DIAGNOSIS — R918 Other nonspecific abnormal finding of lung field: Secondary | ICD-10-CM | POA: Diagnosis not present

## 2023-10-12 DIAGNOSIS — I6310 Cerebral infarction due to embolism of unspecified precerebral artery: Secondary | ICD-10-CM | POA: Diagnosis not present

## 2023-10-12 DIAGNOSIS — I2510 Atherosclerotic heart disease of native coronary artery without angina pectoris: Secondary | ICD-10-CM | POA: Diagnosis not present

## 2023-10-12 DIAGNOSIS — S91302A Unspecified open wound, left foot, initial encounter: Secondary | ICD-10-CM | POA: Diagnosis not present

## 2023-10-12 DIAGNOSIS — U071 COVID-19: Secondary | ICD-10-CM | POA: Diagnosis not present

## 2023-10-12 DIAGNOSIS — A419 Sepsis, unspecified organism: Secondary | ICD-10-CM | POA: Diagnosis not present

## 2023-10-12 DIAGNOSIS — R41841 Cognitive communication deficit: Secondary | ICD-10-CM | POA: Diagnosis not present

## 2023-10-12 DIAGNOSIS — Z66 Do not resuscitate: Secondary | ICD-10-CM | POA: Diagnosis present

## 2023-10-12 DIAGNOSIS — Z9049 Acquired absence of other specified parts of digestive tract: Secondary | ICD-10-CM | POA: Diagnosis not present

## 2023-10-12 DIAGNOSIS — I679 Cerebrovascular disease, unspecified: Secondary | ICD-10-CM | POA: Diagnosis not present

## 2023-10-12 DIAGNOSIS — R279 Unspecified lack of coordination: Secondary | ICD-10-CM | POA: Diagnosis not present

## 2023-10-12 DIAGNOSIS — R339 Retention of urine, unspecified: Secondary | ICD-10-CM | POA: Diagnosis not present

## 2023-10-12 DIAGNOSIS — Z681 Body mass index (BMI) 19 or less, adult: Secondary | ICD-10-CM | POA: Diagnosis not present

## 2023-10-12 DIAGNOSIS — E440 Moderate protein-calorie malnutrition: Secondary | ICD-10-CM | POA: Diagnosis present

## 2023-10-12 DIAGNOSIS — J1281 Pneumonia due to SARS-associated coronavirus: Secondary | ICD-10-CM | POA: Diagnosis not present

## 2023-10-12 MED ORDER — DIAZEPAM 5 MG PO TABS: 5.0000 mg | ORAL_TABLET | Freq: Two times a day (BID) | ORAL | 0 refills | Status: DC

## 2023-10-12 MED ORDER — VITAMIN B-1 100 MG PO TABS: 100.0000 mg | ORAL_TABLET | Freq: Every day | ORAL | 0 refills | Status: DC

## 2023-10-12 MED ORDER — POLYVINYL ALCOHOL 1.4 % OP SOLN: 1.0000 [drp] | Freq: Four times a day (QID) | OPHTHALMIC | 0 refills | Status: DC | PRN

## 2023-10-12 MED ORDER — GABAPENTIN 100 MG PO CAPS: 100.0000 mg | ORAL_CAPSULE | Freq: Two times a day (BID) | ORAL | 2 refills | Status: DC

## 2023-10-12 MED ORDER — LOPERAMIDE HCL 2 MG PO CAPS: 2.0000 mg | ORAL_CAPSULE | Freq: Four times a day (QID) | ORAL | 0 refills | Status: DC | PRN

## 2023-10-12 MED ORDER — COMBIVENT RESPIMAT 20-100 MCG/ACT IN AERS: 1.0000 | INHALATION_SPRAY | Freq: Four times a day (QID) | RESPIRATORY_TRACT | 1 refills | Status: DC | PRN

## 2023-10-12 MED ORDER — CLOPIDOGREL BISULFATE 75 MG PO TABS: 75.0000 mg | ORAL_TABLET | Freq: Every day | ORAL | 2 refills | Status: DC

## 2023-10-12 MED ORDER — PANTOPRAZOLE SODIUM 40 MG PO TBEC: 40.0000 mg | DELAYED_RELEASE_TABLET | Freq: Every day | ORAL | 2 refills | Status: DC

## 2023-10-12 MED ORDER — BENZONATATE 100 MG PO CAPS: 100.0000 mg | ORAL_CAPSULE | Freq: Three times a day (TID) | ORAL | 0 refills | Status: DC | PRN

## 2023-10-12 MED ORDER — ASPIRIN 81 MG PO TBEC: 81.0000 mg | DELAYED_RELEASE_TABLET | Freq: Every day | ORAL | 2 refills | Status: DC

## 2023-10-12 MED ORDER — MIRABEGRON ER 50 MG PO TB24: 50.0000 mg | ORAL_TABLET | Freq: Every day | ORAL | 2 refills | Status: DC

## 2023-10-12 MED ORDER — HYDROCODONE-ACETAMINOPHEN 7.5-325 MG PO TABS: 1.0000 | ORAL_TABLET | Freq: Two times a day (BID) | ORAL | 0 refills | Status: DC

## 2023-10-12 MED ORDER — ENSURE ENLIVE PO LIQD: 237.0000 mL | Freq: Two times a day (BID) | ORAL | 12 refills | Status: DC

## 2023-10-12 NOTE — Discharge Summary (Addendum)
 Physician Discharge Summary  Jordan Ward FMW:[id number] DOB: [birth year] DOA: 09/29/2023  PCP: Shona Norleen PEDLAR, MD  Admit date: 09/29/2023  Discharge date: 10/12/2023  Admitted From:Home  Disposition:  SNF  Recommendations for Outpatient Follow-up:  Follow up with PCP in 1-2 weeks Continue on aspirin  and Plavix  for CVA as prescribed Continue other medications as noted below  Home Health: None  Equipment/Devices: Nasal cannula oxygen   Discharge Condition:Stable  CODE STATUS: Full  Diet recommendation: Heart Healthy  Brief/Interim Summary: Jordan Ward, an 83 y/o with multiple co-morbidities including, MS, CKD4, neurogenic bladder with superpubic cystostomy catheter, obstructed left ureter with nephrostomy tube, COPD/Emphysema on home oxygen , hypothyroidism on replacement, dysphagia, non-healing wound right ankle in setting of PAD, HFpEF, cerebral vascular disease. Over the past several days he has been weaker, more short of breath, coughing, left-sided LE weakness, increased slurred speech. Patient was admitted with COVID-pneumonia as well as significant cerebrovascular disease with small, acute CVA. He is now noted to have strep pneumonia bacteremia as well and 10-day course of IV antibiotics have been completed.  He was awaiting SNF placement and now has bed available and is in stable condition for discharge.  He will remain on dual antiplatelet therapy as prescribed for his CVA as well as his other home medications.  Discharge Diagnoses:  Principal Problem:   Pneumonia due to COVID-19 virus Active Problems:   UTI (urinary tract infection)   Cerebrovascular disease   Chronic diastolic (congestive) heart failure (HCC)   CKD (chronic kidney disease) stage 4, GFR 15-29 ml/min (HCC)   Nephrostomy present (HCC)   Emphysema lung (HCC)   Multiple sclerosis (HCC)   Chronic suprapubic catheter (HCC)   Dysphagia   Hypothyroidism   Dilated cardiomyopathy (HCC)   Essential hypertension    COVID-19  Principal discharge diagnosis: COVID-19 pneumonia along with small ischemic CVA.  Strep pneumonia bacteremia.  Discharge Instructions  Discharge Instructions     Diet - low sodium heart healthy   Complete by: As directed    If the dressing is still on your incision site when you go home, remove it on the third day after your surgery date. Remove dressing if it begins to fall off, or if it is dirty or damaged before the third day.   Complete by: As directed    Increase activity slowly   Complete by: As directed       Allergies as of 10/12/2023       Reactions   Tetracyclines & Related Anaphylaxis, Rash   Cipro  [ciprofloxacin  Hcl] Other (See Comments)   Clemens out in the floor        Medication List     STOP taking these medications    levofloxacin  500 MG tablet Commonly known as: LEVAQUIN    loperamide  2 MG tablet Commonly known as: Imodium  A-D Replaced by: loperamide  2 MG capsule       TAKE these medications    acetaminophen  325 MG tablet Commonly known as: TYLENOL  Take 650 mg by mouth every 6 (six) hours as needed for fever.   amLODipine  10 MG tablet Commonly known as: NORVASC  Take 5 mg by mouth every evening. After supper.   aspirin  EC 81 MG tablet Take 1 tablet (81 mg total) by mouth daily. Swallow whole.   benzonatate  100 MG capsule Commonly known as: TESSALON  Take 1 capsule (100 mg total) by mouth 3 (three) times daily as needed for cough.   clopidogrel  75 MG tablet Commonly known as: PLAVIX  Take 1 tablet (75  mg total) by mouth daily.   Combivent  Respimat 20-100 MCG/ACT Aers respimat Generic drug: Ipratropium-Albuterol  Inhale 1 puff into the lungs every 6 (six) hours as needed.   diazepam  5 MG tablet Commonly known as: VALIUM  Take 1 tablet (5 mg total) by mouth 2 (two) times daily.   feeding supplement Liqd Take 237 mLs by mouth 2 (two) times daily between meals.   HYDROcodone -acetaminophen  7.5-325 MG tablet Commonly known as:  NORCO Take 1 tablet by mouth 2 (two) times daily. Max APAP 3 GM IN 24 HOURS FROM ALL SOURCES   levothyroxine  75 MCG tablet Commonly known as: SYNTHROID  Take 75 mcg by mouth in the morning.   loperamide  2 MG capsule Commonly known as: IMODIUM  Take 1 capsule (2 mg total) by mouth 4 (four) times daily as needed for diarrhea or loose stools. Replaces: loperamide  2 MG tablet   mirabegron  ER 50 MG Tb24 tablet Commonly known as: MYRBETRIQ  Take 1 tablet (50 mg total) by mouth daily.   OXYGEN  Inhale 1 L into the lungs every evening.   pantoprazole  40 MG tablet Commonly known as: PROTONIX  Take 1 tablet (40 mg total) by mouth daily.   polyvinyl alcohol  1.4 % ophthalmic solution Commonly known as: LIQUIFILM TEARS Place 1 drop into both eyes 4 (four) times daily as needed for dry eyes.   potassium chloride  SA 20 MEQ tablet Commonly known as: KLOR-CON  M Take 20 mEq by mouth every evening. After supper   thiamine  100 MG tablet Commonly known as: Vitamin B-1 Take 1 tablet (100 mg total) by mouth daily.               Discharge Care Instructions  (From admission, onward)           Start     Ordered   10/12/23 0000  If the dressing is still on your incision site when you go home, remove it on the third day after your surgery date. Remove dressing if it begins to fall off, or if it is dirty or damaged before the third day.        10/12/23 0946            Contact information for follow-up providers     Shona Norleen PEDLAR, MD. Schedule an appointment as soon as possible for a visit in 1 week(s).   Specialty: Internal Medicine Contact information: [address] [place] [address] [phone]              Contact information for after-discharge care     Destination     [facility] Preferred SNF .   Service: Skilled Nursing Contact information: [address] [place]  [address] [phone]                     Allergies  Allergen Reactions   Tetracyclines & Related Anaphylaxis and Rash   Cipro  [Ciprofloxacin  Hcl] Other (See Comments)    Clemens out in the floor    Consultations: Neurology   Procedures/Studies: ECHOCARDIOGRAM COMPLETE Result Date: 09/30/2023    ECHOCARDIOGRAM REPORT   Patient Name:   Jordan Ward Seefeld Date of Exam: 09/30/2023 Medical Rec #:  [id number]    Height:       65.0 in Accession #:    [id number]   Weight:       112.0 lb Date of Birth:  1940-11-06    BSA:          1.546 m Patient Age:  82 years     BP:           96/62 mmHg Patient Gender: M            HR:           67 bpm. Exam Location:  [facility] Procedure: 2D Echo, Cardiac Doppler and Color Doppler Indications:    Stroke I63.9  History:        Patient has prior history of Echocardiogram examinations, most                 recent 11/08/2019. CHF and Cardiomyopathy, CAD, TIA, Stroke and                 CKD, stage 4, Signs/Symptoms:Hypotension; Risk                 Factors:Hypertension, Dyslipidemia and Sleep Apnea.  Sonographer:    Thea Norlander RCS Referring Phys: [phone] MICHAEL E NORINS IMPRESSIONS  1. Left ventricular ejection fraction, by estimation, is 30 to 35%. The left ventricle has moderately decreased function. The left ventricle demonstrates global hypokinesis. Left ventricular diastolic parameters are consistent with Grade I diastolic dysfunction (impaired relaxation).  2. Right ventricular systolic function is normal. The right ventricular size is normal.  3. The mitral valve is abnormal. Mild mitral valve regurgitation. No evidence of mitral stenosis.  4. The tricuspid valve is abnormal.  5. Moderate aortic stenosis. AVA VTI 1.17, mean gradient 5, DI 0.35, SVI 25. Lower than expected gradient due to low flow low gradient aortic stenosis. . The aortic valve was not well visualized. There is moderate calcification of the aortic valve. There is moderate thickening of the aortic valve. Aortic valve regurgitation is not visualized.  Moderate aortic valve stenosis.  6. The inferior vena cava is normal in size with greater than 50% respiratory variability, suggesting right atrial pressure of 3 mmHg. FINDINGS  Left Ventricle: Left ventricular ejection fraction, by estimation, is 30 to 35%. The left ventricle has moderately decreased function. The left ventricle demonstrates global hypokinesis. The left ventricular internal cavity size was normal in size. There is no left ventricular hypertrophy. Left ventricular diastolic parameters are consistent with Grade I diastolic dysfunction (impaired relaxation). Normal left ventricular filling pressure. Right Ventricle: The right ventricular size is normal. Right vetricular [place] thickness was not well visualized. Right ventricular systolic function is normal. Left Atrium: Left atrial size was normal in size. Right Atrium: Right atrial size was normal in size. Pericardium: There is no evidence of pericardial effusion. Mitral Valve: The mitral valve is abnormal. Mild mitral valve regurgitation. No evidence of mitral valve stenosis. Tricuspid Valve: The tricuspid valve is abnormal. Tricuspid valve regurgitation is mild . No evidence of tricuspid stenosis. Aortic Valve: Moderate aortic stenosis. AVA VTI 1.17, mean gradient 5, DI 0.35, SVI 25. Lower than expected gradient due to low flow low gradient aortic stenosis. The aortic valve was not well visualized. There is moderate calcification of the aortic valve. There is moderate thickening of the aortic valve. There is moderate aortic valve annular calcification. Aortic valve regurgitation is not visualized. Moderate aortic stenosis is present. Aortic valve mean gradient measures 4.8 mmHg. Aortic valve peak gradient measures 7.5 mmHg. Aortic valve area, by VTI measures 1.17 cm. Pulmonic Valve: The pulmonic valve was not well visualized. Pulmonic valve regurgitation is not visualized. No evidence of pulmonic stenosis. Aorta: The aortic root and ascending aorta  are structurally normal, with no evidence of dilitation. Venous: The inferior vena cava is  normal in size with greater than 50% respiratory variability, suggesting right atrial pressure of 3 mmHg. IAS/Shunts: No atrial level shunt detected by color flow Doppler.  LEFT VENTRICLE PLAX 2D LVIDd:         4.70 cm      Diastology LVIDs:         4.10 cm      LV e' medial:    3.68 cm/s LV PW:         0.80 cm      LV E/e' medial:  14.7 LV IVS:        0.80 cm      LV e' lateral:   6.22 cm/s LVOT diam:     2.10 cm      LV E/e' lateral: 8.7 LV SV:         39 LV SV Index:   25 LVOT Area:     3.46 cm  LV Volumes (MOD) LV vol d, MOD A2C: 154.0 ml LV vol d, MOD A4C: 81.6 ml LV vol s, MOD A2C: 109.0 ml LV vol s, MOD A4C: 58.9 ml LV SV MOD A2C:     45.0 ml LV SV MOD A4C:     81.6 ml LV SV MOD BP:      37.8 ml RIGHT VENTRICLE             IVC RV S prime:     10.60 cm/s  IVC diam: 1.60 cm TAPSE (M-mode): 2.5 cm LEFT ATRIUM             Index        RIGHT ATRIUM           Index LA diam:        3.50 cm 2.26 cm/m   RA Area:     11.20 cm LA Vol (A2C):   38.2 ml 24.71 ml/m  RA Volume:   27.50 ml  17.79 ml/m LA Vol (A4C):   37.9 ml 24.51 ml/m LA Biplane Vol: 39.2 ml 25.35 ml/m  AORTIC VALVE AV Area (Vmax):    1.28 cm AV Area (Vmean):   1.11 cm AV Area (VTI):     1.17 cm AV Vmax:           136.70 cm/s AV Vmean:          100.732 cm/s AV VTI:            0.333 m AV Peak Grad:      7.5 mmHg AV Mean Grad:      4.8 mmHg LVOT Vmax:         50.40 cm/s LVOT Vmean:        32.200 cm/s LVOT VTI:          0.113 m LVOT/AV VTI ratio: 0.34  AORTA Ao Root diam: 2.60 cm Ao Asc diam:  3.40 cm MITRAL VALVE               TRICUSPID VALVE MV Area (PHT): 3.12 cm    TR Peak grad:   26.8 mmHg MV Decel Time: 243 msec    TR Vmax:        259.00 cm/s MV E velocity: 54.20 cm/s MV A velocity: 85.90 cm/s  SHUNTS MV E/A ratio:  0.63        Systemic VTI:  0.11 m                            Systemic Diam:  2.10 cm Dorn Ross MD Electronically signed by Dorn Ross MD Signature Date/Time: 09/30/2023/1:11:09 PM    Final    MR BRAIN WO CONTRAST Result Date: 09/29/2023 CLINICAL DATA:  Provided history: Stroke, follow-up. Additional history obtained from electronic MEDICAL RECORD NUMBERHistory of multiple sclerosis. EXAM: MRI HEAD WITHOUT CONTRAST MRA HEAD WITHOUT CONTRAST TECHNIQUE: Multiplanar, multi-echo pulse sequences of the brain and surrounding structures were acquired without intravenous contrast. Angiographic images of the Circle of Willis were acquired using MRA technique without intravenous contrast. COMPARISON:  Head CT 08/24/2022. Brain MRI 10/03/2011. Report from MRA head 09/30/2001 (images unavailable). FINDINGS: MRI HEAD FINDINGS Brain: Mild-to-moderate cerebral atrophy. Punctate acute infarct within the right occipital lobe near the gray-white junction (PCA vascular territory) (series 5, image 21). Punctate acute infarct within the left occipital lobe white matter (PCA territory) (series 5, image 18). 14 mm acute infarct within the left cerebellar hemisphere (series 5, image 7). Multiple chronic lacunar infarcts again noted within the bilateral cerebral hemispheric white matter and within/about the bilateral deep gray nuclei. Chronic hemosiderin deposition associated with some of these infarcts. Background advanced patchy and confluent T2 FLAIR hyperintense signal abnormality within the cerebral white matter, likely reflecting a combination of chronic small ischemic disease and demyelinating disease given patient's age and history of multiple sclerosis. No cortical encephalomalacia is identified. No evidence of an intracranial mass. No extra-axial fluid collection. No midline shift. Vascular: Reported below under the MRA section. Skull and upper cervical spine: No focal worrisome marrow lesion. Incompletely assessed cervical spondylosis. Sinuses/Orbits: No mass or acute finding within the imaged orbits. Prior bilateral ocular lens replacement. Minimal mucosal  thickening within the bilateral ethmoid and right maxillary sinuses. MRA HEAD FINDINGS Mildly motion degraded examination. Within this limitation, findings are as follows. Anterior circulation: The intracranial internal carotid arteries are patent. Atherosclerotic irregularity of both vessels. Moderate/severe focal stenosis within the cavernous left ICA. No more than mild stenosis of the intracranial internal carotid arteries elsewhere. The M1 middle cerebral arteries are patent. Atherosclerotic irregularity of the M2 and more distal MCA vessels, bilaterally. No M2 proximal branch occlusion or high-grade proximal M2 stenosis is identified. The anterior cerebral arteries are patent. Moderate stenosis within the left anterior cerebral artery proximal A2 segment. 3 mm focus of flow-related signal along the medial aspect of the cavernous left internal carotid artery suspicious for an aneurysm (for instance as seen on series 12, image 70). 3 mm inferomedially projecting vascular protrusion arising from the cavernous left internal carotid artery more distally, also suspicious for an aneurysm (for instance as seen on series 1021, image 13). Posterior circulation: Sites of occlusion within the V4 right vertebral artery, by report new from the prior MRA head of 09/30/2001. Flow related signal is present within the proximal right PICA. The visualized intracranial left vertebral artery is patent without stenosis. The basilar artery is patent. The posterior cerebral arteries are patent. Atherosclerotic irregularity of both vessels. Most notably, there is a severe stenosis within a left PCA branch at the P3/P4 junction (series 1033, image 3). Posterior communicating arteries are diminutive or absent, bilaterally. Anatomic variants: As described. IMPRESSION: MRI brain: 1. Punctate acute infarct within the right occipital lobe (PCA vascular territory). 2. Punctate acute infarct within the left occipital lobe (PCA vascular  territory). 3. 14 mm acute infarct within the left cerebellar hemisphere. 4. Chronic lacunar infarcts again noted within the bilateral cerebral hemispheric white matter and within/about the bilateral deep gray nuclei. 5. Background advanced cerebral white matter disease which has  progressed from the prior brain MRI of 10/03/2011. Findings likely reflect a combination of chronic small vessel ischemic disease and demyelinating disease given the patient's age and history of multiple sclerosis. 6. Mild-to-moderate cerebral atrophy. MRA head: 1. Mildly motion degraded exam. 2. Sites of occlusion within the intracranial right vertebral artery which, by report, are new from the prior MRA head of 09/30/2001. 3. Background intracranial atherosclerotic disease with multifocal stenoses, most notably as follows. 4. Moderate/severe focal stenosis within the cavernous left internal carotid artery. 5. Moderate stenosis within the left anterior cerebral artery proximal A2 segment. 6. Severe stenosis within a left posterior cerebral artery branch at the P3/P4 junction. 7. Two 3 mm foci along the cavernous left internal carotid artery, as described and suspicious for arterial aneurysm. A CTA of the head is recommended for further evaluation. Electronically Signed   By: Rockey Childs D.O.   On: 09/29/2023 19:13   MR ANGIO HEAD WO CONTRAST Result Date: 09/29/2023 CLINICAL DATA:  Provided history: Stroke, follow-up. Additional history obtained from electronic MEDICAL RECORD NUMBERHistory of multiple sclerosis. EXAM: MRI HEAD WITHOUT CONTRAST MRA HEAD WITHOUT CONTRAST TECHNIQUE: Multiplanar, multi-echo pulse sequences of the brain and surrounding structures were acquired without intravenous contrast. Angiographic images of the Circle of Willis were acquired using MRA technique without intravenous contrast. COMPARISON:  Head CT 08/24/2022. Brain MRI 10/03/2011. Report from MRA head 09/30/2001 (images unavailable). FINDINGS: MRI HEAD  FINDINGS Brain: Mild-to-moderate cerebral atrophy. Punctate acute infarct within the right occipital lobe near the gray-white junction (PCA vascular territory) (series 5, image 21). Punctate acute infarct within the left occipital lobe white matter (PCA territory) (series 5, image 18). 14 mm acute infarct within the left cerebellar hemisphere (series 5, image 7). Multiple chronic lacunar infarcts again noted within the bilateral cerebral hemispheric white matter and within/about the bilateral deep gray nuclei. Chronic hemosiderin deposition associated with some of these infarcts. Background advanced patchy and confluent T2 FLAIR hyperintense signal abnormality within the cerebral white matter, likely reflecting a combination of chronic small ischemic disease and demyelinating disease given patient's age and history of multiple sclerosis. No cortical encephalomalacia is identified. No evidence of an intracranial mass. No extra-axial fluid collection. No midline shift. Vascular: Reported below under the MRA section. Skull and upper cervical spine: No focal worrisome marrow lesion. Incompletely assessed cervical spondylosis. Sinuses/Orbits: No mass or acute finding within the imaged orbits. Prior bilateral ocular lens replacement. Minimal mucosal thickening within the bilateral ethmoid and right maxillary sinuses. MRA HEAD FINDINGS Mildly motion degraded examination. Within this limitation, findings are as follows. Anterior circulation: The intracranial internal carotid arteries are patent. Atherosclerotic irregularity of both vessels. Moderate/severe focal stenosis within the cavernous left ICA. No more than mild stenosis of the intracranial internal carotid arteries elsewhere. The M1 middle cerebral arteries are patent. Atherosclerotic irregularity of the M2 and more distal MCA vessels, bilaterally. No M2 proximal branch occlusion or high-grade proximal M2 stenosis is identified. The anterior cerebral arteries are  patent. Moderate stenosis within the left anterior cerebral artery proximal A2 segment. 3 mm focus of flow-related signal along the medial aspect of the cavernous left internal carotid artery suspicious for an aneurysm (for instance as seen on series 12, image 70). 3 mm inferomedially projecting vascular protrusion arising from the cavernous left internal carotid artery more distally, also suspicious for an aneurysm (for instance as seen on series 1021, image 13). Posterior circulation: Sites of occlusion within the V4 right vertebral artery, by report new from the prior MRA head  of 09/30/2001. Flow related signal is present within the proximal right PICA. The visualized intracranial left vertebral artery is patent without stenosis. The basilar artery is patent. The posterior cerebral arteries are patent. Atherosclerotic irregularity of both vessels. Most notably, there is a severe stenosis within a left PCA branch at the P3/P4 junction (series 1033, image 3). Posterior communicating arteries are diminutive or absent, bilaterally. Anatomic variants: As described. IMPRESSION: MRI brain: 1. Punctate acute infarct within the right occipital lobe (PCA vascular territory). 2. Punctate acute infarct within the left occipital lobe (PCA vascular territory). 3. 14 mm acute infarct within the left cerebellar hemisphere. 4. Chronic lacunar infarcts again noted within the bilateral cerebral hemispheric white matter and within/about the bilateral deep gray nuclei. 5. Background advanced cerebral white matter disease which has progressed from the prior brain MRI of 10/03/2011. Findings likely reflect a combination of chronic small vessel ischemic disease and demyelinating disease given the patient's age and history of multiple sclerosis. 6. Mild-to-moderate cerebral atrophy. MRA head: 1. Mildly motion degraded exam. 2. Sites of occlusion within the intracranial right vertebral artery which, by report, are new from the prior MRA  head of 09/30/2001. 3. Background intracranial atherosclerotic disease with multifocal stenoses, most notably as follows. 4. Moderate/severe focal stenosis within the cavernous left internal carotid artery. 5. Moderate stenosis within the left anterior cerebral artery proximal A2 segment. 6. Severe stenosis within a left posterior cerebral artery branch at the P3/P4 junction. 7. Two 3 mm foci along the cavernous left internal carotid artery, as described and suspicious for arterial aneurysm. A CTA of the head is recommended for further evaluation. Electronically Signed   By: Rockey Childs D.O.   On: 09/29/2023 19:13   DG Chest Portable 1 View Result Date: 09/29/2023 CLINICAL DATA:  Hypoxia.  Cough.  Recent diagnosis of COVID. EXAM: PORTABLE CHEST 1 VIEW COMPARISON:  08/24/2022 FINDINGS: Unchanged cardiac silhouette and mediastinal contours with atherosclerotic plaque within the thoracic aorta. Interval development of heterogeneous airspace opacities within the right mid and bilateral lower lungs. Lungs remain hyperexpanded with thinning of the biapical pulmonary parenchyma. Trace right-sided pleural effusion is not excluded. No pneumothorax. No acute osseous abnormalities. Degenerative change of the left glenohumeral joint is suspected though incompletely evaluated. IMPRESSION: Findings worrisome for multifocal pneumonia, right-greater-than-left, superimposed on advanced emphysematous change. A follow-up chest radiograph in 3 to 4 weeks after treatment is recommended to ensure resolution. Electronically Signed   By: Norleen Roulette M.D.   On: 09/29/2023 16:06   IR NEPHROSTOMY EXCHANGE LEFT Result Date: 09/15/2023 INDICATION: 83 year old male presents for routine left percutaneous nephrostomy exchange EXAM: IR EXCHANGE NEPHROSTOMY LEFT COMPARISON:  07/21/2023 MEDICATIONS: None ANESTHESIA/SEDATION: None CONTRAST:  10 cc-administered into the collecting system(s) FLUOROSCOPY TIME:  Fluoroscopy Time:   (2 mGy).  COMPLICATIONS: None PROCEDURE: Informed written consent was obtained from the patient after a thorough discussion of the procedural risks, benefits and alternatives. All questions were addressed. Maximal Sterile Barrier Technique was utilized including caps, mask, sterile gowns, sterile gloves, sterile drape, hand hygiene and skin antiseptic. A timeout was performed prior to the initiation of the procedure. The patient was positioned in the operation suite in the right posterior oblique position on fluoroscopy table. The left flank and the indwelling tube were prepped and draped in the usual sterile fashion, and a sterile drape was placed. Scout image was obtained. Contrast was infused through the indwelling catheter, opacifying the collecting system. The catheter was then ligated, and an 035 wire was advanced to the  collecting system. The catheter was then removed over the wire. A new, 12 French percutaneous nephrostomy tube was advanced over the wire, with the radial opaque marker positioned at the collecting system. The wire and inner dilator/stiffener were removed, and the pigtail catheter was formed in the collecting system. Contrast was infused through the catheter confirming position. A final image was stored. The patient tolerated the procedure well and remained hemodynamically stable throughout. No complications were encountered and no significant blood loss was encountered. IMPRESSION: Status post image guided routine left PCN exchange Signed, Ami RAMAN. Alona ROSALEA GRAVER, RPVI [facility] Radiology Electronically Signed   By: Ami Alona D.O.   On: 09/15/2023 16:26     Discharge Exam: Vitals:   10/12/23 0736 10/12/23 0740  BP:    Pulse:    Resp:    Temp:    SpO2: 92% 92%   Vitals:   10/11/23 2200 10/12/23 0519 10/12/23 0736 10/12/23 0740  BP: 94/66 (!) 103/59    Pulse: 87 75    Resp: 20 20    Temp: 98 F (36.7 C)     TempSrc:      SpO2:  95% 93% 92% 92%  Weight:      Height:        General: Pt is alert, awake, not in acute distress Cardiovascular: RRR, S1/S2 +, no rubs, no gallops Respiratory: CTA bilaterally, no wheezing, no rhonchi, nasal cannula oxygen  Abdominal: Soft, NT, ND, bowel sounds +, suprapubic catheter Extremities: no edema, no cyanosis    The results of significant diagnostics from this hospitalization (including imaging, microbiology, ancillary and laboratory) are listed below for reference.     Microbiology: No results found for this or any previous visit (from the past 240 hours).   Labs: BNP (last 3 results) Recent Labs    09/29/23 1411  BNP 488.0*   Basic Metabolic Panel: Recent Labs  Lab 10/08/23 0749 10/09/23 0400  NA 142 136  K 4.8 4.6  CL 112* 112*  CO2 18* 19*  GLUCOSE 96 97  BUN 66* 56*  CREATININE 1.93* 1.91*  CALCIUM  8.0* 7.6*  MG 2.5* 2.1   Liver Function Tests: No results for input(s): AST, ALT, ALKPHOS, BILITOT, PROT, ALBUMIN in the last 168 hours. No results for input(s): LIPASE, AMYLASE in the last 168 hours. No results for input(s): AMMONIA in the last 168 hours. CBC: Recent Labs  Lab 10/08/23 0749 10/09/23 0400  WBC 27.4* 19.6*  NEUTROABS  --  17.5*  HGB 12.5* 10.2*  HCT 41.2 33.0*  MCV 97.9 94.3  PLT 244 207   Cardiac Enzymes: No results for input(s): CKTOTAL, CKMB, CKMBINDEX, TROPONINI in the last 168 hours. BNP: Invalid input(s): POCBNP CBG: No results for input(s): GLUCAP in the last 168 hours. D-Dimer No results for input(s): DDIMER in the last 72 hours. Hgb A1c No results for input(s): HGBA1C in the last 72 hours. Lipid Profile No results for input(s): CHOL, HDL, LDLCALC, TRIG, CHOLHDL, LDLDIRECT in the last 72 hours. Thyroid  function studies No results for input(s): TSH, T4TOTAL, T3FREE, THYROIDAB in the last 72 hours.  Invalid input(s): FREET3 Anemia work up No results for  input(s): VITAMINB12, FOLATE, FERRITIN, TIBC, IRON, RETICCTPCT in the last 72 hours. Urinalysis    Component Value Date/Time   COLORURINE YELLOW 09/29/2023 1430   APPEARANCEUR CLOUDY (A) 09/29/2023 1430   LABSPEC 1.015 09/29/2023 1430   PHURINE 6.0 09/29/2023 1430   GLUCOSEU NEGATIVE 09/29/2023 1430   HGBUR MODERATE (  A) 09/29/2023 1430   BILIRUBINUR NEGATIVE 09/29/2023 1430   BILIRUBINUR small (A) 10/26/2021 1538   KETONESUR NEGATIVE 09/29/2023 1430   PROTEINUR 100 (A) 09/29/2023 1430   UROBILINOGEN 0.2 10/26/2021 1538   UROBILINOGEN 0.2 07/17/2014 1215   NITRITE NEGATIVE 09/29/2023 1430   LEUKOCYTESUR LARGE (A) 09/29/2023 1430   Sepsis Labs Recent Labs  Lab 10/08/23 0749 10/09/23 0400  WBC 27.4* 19.6*   Microbiology No results found for this or any previous visit (from the past 240 hours).   Time coordinating discharge: 35 minutes  SIGNED:   Adron JONETTA Fairly, DO [facility] 10/12/2023, 10:01 AM  If 7PM-7AM, please contact night-coverage [email or web address]

## 2023-10-12 NOTE — Consult Note (Addendum)
 [facility] Liaison Consult Note    10/12/2023  Jordan Ward [birth year] [id number]   Primary Care Provider:   Norleen Hurst, MD   Patient is currently active with Care Management for chronic disease management services.  Patient has been engaged by a  corporate investment banker.  Our community based plan of care has focused on disease management and community resource support.   Patient will receive a post hospital call and will be evaluated for assessments and disease process education.   Plan: Pt will discharged as indicated by [facility] to SNF level of care today.  Liaison will collaborate with the PAC-RN on pt's discharge disposition.   Of note, Care Management services does not replace or interfere with any services that are needed or arranged by inpatient [facility] care management team.   For additional questions or referrals please contact:   Olam Ku, RN, [facility] Liaison [place]   [facility], Population Health Office Hours MTWF  8:00 am-6:00 pm Direct Dial: [phone] mobile [phone] [Office toll free line] Office Hours are M-F 8:30 - 5 pm [email or web address]

## 2023-10-12 NOTE — Progress Notes (Signed)
 Dressing to suprapubic cath changed this morning due to leakage around site which patient states is chronic.  Urostomy bag emptied this morning of 250 mls clear yellow urine.  Wife has been at bedside and was going to take patient belongings to [facility] .  IV removed and EMS here now to transport patient.  Report called to [facility] at [facility].

## 2023-10-12 NOTE — TOC Transition Note (Signed)
 Transition of Care [facility]) - Discharge Note   Patient Details  Name: Jordan Ward MRN: [id number] Date of Birth: 1941-08-10  [facility]) CM/SW Contact:  [name], LCSWA Phone Number: 10/12/2023, 11:39 AM   Clinical Narrative:    CSW spoke to [name] with [facility] who states that they are ready to accept pt for SNF. CSW sent D/C clinicals over via HUB. RN provided with room and report numbers. Med necessity sent up to floor for RN. Pts wife updated of plan for D/C. EMS to be called once RN states they are ready. TOC signing off.   Final next level of care: Skilled Nursing Facility Barriers to Discharge: Continued Medical Work up   Patient Goals and CMS Choice Patient states their goals for this hospitalization and ongoing recovery are:: go to SNF CMS Medicare.gov Compare Post Acute Care list provided to:: Patient Represenative (must comment) Choice offered to / list presented to : Patient, Spouse [name] ownership interest in [facility].provided to:: Spouse    Discharge Placement                Patient to be transferred to facility by: EMS Name of family member notified: Wife Patient and family notified of of transfer: 10/12/23  Discharge Plan and Services Additional resources added to the After Visit Summary for   In-house Referral: Clinical Social Work   Post Acute Care Choice: Skilled Nursing Facility                               Social Drivers of Health (SDOH) Interventions SDOH Screenings   Food Insecurity: No Food Insecurity (09/30/2023)  Housing: Unknown (09/30/2023)  Transportation Needs: No Transportation Needs (09/30/2023)  Utilities: Not At Risk (09/30/2023)  Depression (PHQ2-9): Low Risk  (09/26/2022)  Financial Resource Strain: Low Risk  (09/26/2022)  Social Connections: Socially Isolated (09/30/2023)  Stress: No Stress Concern Present (09/26/2022)  Tobacco Use: Medium Risk (09/29/2023)     Readmission Risk  Interventions    10/12/2023   11:35 AM 08/25/2022    3:58 PM 07/15/2021    9:54 AM  Readmission Risk Prevention Plan  Transportation Screening Complete Complete   PCP or Specialist Appt within 3-5 Days   Complete  Home Care Screening Complete Complete   Medication Review (RN CM) Complete Complete   HRI or Home Care Consult   Complete  Social Work Consult for Recovery Care Planning/Counseling   Complete  Palliative Care Screening   Complete  Medication Review Oceanographer)   Complete

## 2023-10-12 NOTE — Progress Notes (Signed)
 History of Present Illness:   He has a neurogenic bladder and has had a longstanding suprapubic tube.  Currently, output is not great as he has an atrophic right  kidney and a left percutaneous nephrostomy.  He still has bladder spasms and is on Myrbetriq  25 mg a day.   He has recurrent urolithiasis, left renal unit.  Because of multiple ureteral stones and longstanding hydronephrosis, he had a left-sided nephrostomy tube placed in May, 2022.     CT abdomen and pelvis for follow-up of urolithiasis was performed 12.11.2022 revealing hydronephrosis and misplaced percutaneous tube.  He did have a 16mm left renal pelvic stone at that time.   1.31.2023: Here because of recent fever/probable UTI.  Had a suprapubic tube changed on the 27th.     4.18.2023: Here today for routine check.  Has suprapubic tube scheduled to be changed today.  In early 2023 his left renal pelvic stone was 16 mm in size.  He is having no symptoms from that.  The large majority of his urine comes out his left nephrostomy tube.  No recent treatment for urinary tract infections.  7.18.2023: Here for f/u. KUB 5.23.203 revealed continued 16 mm Lt renal stone. No real problems noted since last visit here. He takes levaquin  before tube changes.  1.14.2024: [facility] is here today for routine check/catheter change.  Still getting his nephrostomy tube changed about every 6 weeks, his suprapubic changed here every month.  Just discharged from the hospital yesterday for management of a minor stroke as well as pneumonia.  He is in the nursing facility temporarily.  Most recent CT scan revealed a 10 mm left renal stone.  Past Medical History:  Diagnosis Date   Anemia    Arthritis    Back pain, chronic    Bilateral carotid bruits    C. difficile colitis 09/2011   CAD (coronary artery disease)    Carotid artery stenosis    Cerebrovascular disease    Cerebrovascular disease 08/17/2018   Colon polyps    Complication of cystostomy catheter,  initial encounter (HCC) 04/20/2020   Dyslipidemia    Dysphagia 10/07/2011   Encephalopathy    Gait disorder    HA (headache)    High grade dysplasia in colonic adenoma 09/2005   History of kidney stones    HTN (hypertension), malignant 10/06/2011   Hypernatremia    Hypokalemia    Hypothyroidism 10/08/2011   Insomnia    Junctional rhythm    Kidney stones    MS (multiple sclerosis) (HCC)    Nausea and vomiting 03/02/2020   Neuromuscular disorder (HCC)    MS   OSA (obstructive sleep apnea)    Paroxysmal atrial tachycardia (HCC)    Peripheral vascular disease (HCC)    Pneumonia 4 yrs ago   Pulmonary fibrosis (HCC) 10/06/2011   Pulmonary nodule 10/08/2011   PVD (peripheral vascular disease) (HCC)    Sacral ulcer (HCC)    Sleep apnea    cannot tolerate   Stroke (HCC)    left sided weakness   Suprapubic catheter (HCC)    TIA (transient ischemic attack)    Tremors of nervous system 10/08/2011   Urinary tract infection    Ventral hernia without obstruction or gangrene     Large 8X9cm ventral hernia with loss of domain. CT reads report as diastasis recti with herniation or diastasis recti.  Dr. Kallie, Surgery, reviewed CT with radiology and there is herniation with only hernia sac or peritoneum over the bowel and large  separation of the rectus muslce (i.e. diastasis recti aka loss of domain).  No surgical intervention recommended given size, age, and health.     Past Surgical History:  Procedure Laterality Date   APPENDECTOMY  09/2005   at time of left hemicolectomy   BACK SURGERY  1976/1979   lower   BILIARY DILATION N/A 03/03/2020   Procedure: BILIARY DILATION;  Surgeon: Golda Claudis PENNER, MD;  [facility];  Service: Endoscopy;  Laterality: N/A;   CATARACT EXTRACTION W/PHACO Right 03/08/2018   Procedure: CATARACT EXTRACTION PHACO AND INTRAOCULAR LENS PLACEMENT RIGHT EYE;  Surgeon: Perley Hamilton, MD;  Location: [facility];  Service: Ophthalmology;  Laterality: Right;  CDE:  8.86   CATARACT EXTRACTION W/PHACO Left 04/05/2018   Procedure: CATARACT EXTRACTION PHACO AND INTRAOCULAR LENS PLACEMENT (IOC);  Surgeon: Perley Hamilton, MD;  Location: [facility];  Service: Ophthalmology;  Laterality: Left;  CDE: 7.36   CENTRAL LINE INSERTION-TUNNELED Right 09/11/2020   Procedure: PLACEMENT OF TUNNELED CENTRAL LINE INTO JUGULAR VEIN;  Surgeon: Kallie Manuelita BROCKS, MD;  Location: [facility];  Service: General;  Laterality: Right;   CHOLECYSTECTOMY     Dr. Claudene   COLON SURGERY  09/2005   Fleishman: four tubular adenomas, large adenomatous polyp with HIGH GRADE dysplasia   COLONOSCOPY  11/2004   Dr. Lendon sessile polyp splenic flexure, 10mm sessile polyp desc colon, tubulovillous adenoma (bx not removed)   COLONOSCOPY  01/2005   poor prep, polyp could not be found   COLONOSCOPY  05/2005   with EMR, polypectomy Dr. Norleen Dec, bx showed high grade dysplasia, partially resected   COLONOSCOPY  09/2005   Dr. Dec, ind[place] ink tattooing, four villous colon polyp (3 had been missed on previous colonoscopies due to limitations of procedures   COLONOSCOPY  09/2006   normal TI, no polyps   COLONOSCOPY  10/2007   Dr. Randol distal mammillations, benign bx, normal TI, random bx neg for microscopic colitis   CYSTOSCOPY W/ URETERAL STENT PLACEMENT Left 09/05/2020   Procedure: CYSTOSCOPY WITH RETROGRADE PYELOGRAM/URETERAL STENT PLACEMENT;  Surgeon: Cam Morene ORN, MD;  Location: [facility];  Service: Urology;  Laterality: Left;   CYSTOSCOPY WITH LITHOLAPAXY N/A 07/27/2018   Procedure: CYSTOSCOPY WITH LITHOLAPAXY VIA  SUPRAPUBIC TUBE;  Surgeon: Matilda Senior, MD;  Location: [facility];  Service: Urology;  Laterality: N/A;   CYSTOSCOPY WITH RETROGRADE PYELOGRAM, URETEROSCOPY AND STENT PLACEMENT Left 06/09/2017   Procedure: CYSTOSCOPY WITH LEFT RETROGRADE PYELOGRAM, LEFT URETEROSCOPY, LEFT URETEROSCOPIC STONE EXTRACTION, LEFT URETERAL STENT PLACEMENT;  Surgeon: Matilda Senior, MD;   Location: [facility];  Service: Urology;  Laterality: Left;   CYSTOSCOPY/URETEROSCOPY/HOLMIUM LASER/STENT PLACEMENT Left 05/22/2020   Procedure: CYSTOSCOPY/URETEROSCOPY/STENT PLACEMENT;  Surgeon: Matilda Senior, MD;  [facility]: [facility];  Service: Urology;  Laterality: Left;   ERCP N/A 03/03/2020   Procedure: ENDOSCOPIC RETROGRADE CHOLANGIOPANCREATOGRAPHY (ERCP);  Surgeon: Golda Claudis PENNER, MD;  [facility];  Service: Endoscopy;  Laterality: N/A;   ERCP N/A 04/20/2020   Procedure: ENDOSCOPIC RETROGRADE CHOLANGIOPANCREATOGRAPHY (ERCP);  Surgeon: Golda Claudis PENNER, MD;  [facility];  Service: Endoscopy;  Laterality: N/A;  to be done at 7:30am in OR   GASTROINTESTINAL STENT REMOVAL N/A 04/20/2020   Procedure: STENT REMOVAL;  Surgeon: Golda Claudis PENNER, MD;  [facility];  Service: Endoscopy;  Laterality: N/A;   INGUINAL HERNIA REPAIR  1971   bilateral   INSERTION OF SUPRAPUBIC CATHETER  06/09/2017   Procedure: EXCHANGE OF SUPRAPUBIC CATHETER;  Surgeon: Matilda Senior, MD;  Location: [facility];  Service: Urology;;   INSERTION OF SUPRAPUBIC CATHETER  05/22/2020   Procedure: SUPRAPUBIC CATHETER EXCHANGE;  Surgeon: Matilda Senior, MD;  Location: [facility];  Service: Urology;;   IR NEPHROSTOMY EXCHANGE LEFT  03/25/2021   IR NEPHROSTOMY EXCHANGE LEFT  05/20/2021   IR NEPHROSTOMY EXCHANGE LEFT  07/13/2021   IR NEPHROSTOMY EXCHANGE LEFT  09/09/2021   IR NEPHROSTOMY EXCHANGE LEFT  11/04/2021   IR NEPHROSTOMY EXCHANGE LEFT  12/23/2021   IR NEPHROSTOMY EXCHANGE LEFT  02/17/2022   IR NEPHROSTOMY EXCHANGE LEFT  04/14/2022   IR NEPHROSTOMY EXCHANGE LEFT  06/09/2022   IR NEPHROSTOMY EXCHANGE LEFT  07/28/2022   IR NEPHROSTOMY EXCHANGE LEFT  09/09/2022   IR NEPHROSTOMY EXCHANGE LEFT  11/04/2022   IR NEPHROSTOMY EXCHANGE LEFT  12/30/2022   IR NEPHROSTOMY EXCHANGE LEFT  02/25/2023   IR NEPHROSTOMY EXCHANGE LEFT  03/31/2023   IR NEPHROSTOMY EXCHANGE LEFT  05/26/2023   IR NEPHROSTOMY EXCHANGE LEFT   07/21/2023   IR NEPHROSTOMY EXCHANGE LEFT  09/15/2023   IR NEPHROSTOMY PLACEMENT LEFT  05/26/2017   IR NEPHROSTOMY PLACEMENT LEFT  05/02/2020   IR NEPHROSTOMY PLACEMENT LEFT  02/15/2021   IR URETERAL STENT PERC REMOVAL MOD SED  03/25/2021   KIDNEY STONE SURGERY  09/13/2015   LITHOTRIPSY N/A 03/03/2020   Procedure: MECHANICAL LITHOTRIPSY WITH REMOVAL OF MULTIPLE STONE FRAGMENTS;  Surgeon: Golda Claudis PENNER, MD;  [facility];  Service: Endoscopy;  Laterality: N/A;   NEPHROLITHOTOMY Left 09/13/2015   Procedure: LEFT PERCUTANEOUS NEPHROLITHOTOMY ;  Surgeon: Senior Matilda, MD;  Location: [facility];  Service: Urology;  Laterality: Left;   NEPHROSTOMY TUBE REMOVAL  05/22/2020   Procedure: NEPHROSTOMY TUBE REMOVAL;  Surgeon: Matilda Senior, MD;  Location: [facility];  Service: Urology;;   [facility] REMOVAL Right 09/20/2020   Procedure: MINOR REMOVAL CENTRAL LINE;  Surgeon: Kallie Manuelita BROCKS, MD;  Location: [facility];  Service: General;  Laterality: Right;  Pt to arrive at 7:30am for procedure   REMOVAL OF STONES N/A 04/20/2020   Procedure: REMOVAL OF STONES;  Surgeon: Golda Claudis PENNER, MD;  [facility];  Service: Endoscopy;  Laterality: N/A;   SPHINCTEROTOMY  03/03/2020   Procedure: BILLARY SPHINCTEROTOMY;  Surgeon: Golda Claudis PENNER, MD;  [facility];  Service: Endoscopy;;   SUPRAPUBIC CATHETER INSERTION      Home Medications:  Allergies as of 10/13/2023       Reactions   Tetracyclines & Related Anaphylaxis, Rash   Cipro  [ciprofloxacin  Hcl] Other (See Comments)   Clemens out in the floor        Medication List      Notice   This visit is during an admission. Changes to the med list made in this visit will be reflected in the After Visit Summary of the admission.     Allergies:  Allergies  Allergen Reactions   Tetracyclines & Related Anaphylaxis and Rash   Cipro  [Ciprofloxacin  Hcl] Other (See Comments)    Clemens out in the floor    Family History   Problem Relation Age of Onset   Cirrhosis Brother        etoh   Stroke Mother 60   Coronary artery disease Father 34   Heart attack Brother    Cancer Sister    Multiple sclerosis Other    Colon cancer Neg Hx     Social History:  reports that he quit smoking about 35 years ago. His smoking use included cigarettes. He started  smoking about 60 years ago. He has a 25 pack-year smoking history. He has never used smokeless tobacco. He reports that he does not drink alcohol  and does not use drugs.  ROS: A complete review of systems was performed.  All systems are negative except for pertinent findings as noted.  Physical Exam:  Vital signs in last 24 hours: There were no vitals taken for this visit. Constitutional:  Alert and oriented, No acute distress.  Cachectic appearing.  He is using nasal cannula oxygen  Cardiovascular: Regular rate  Respiratory: Normal respiratory effort Neurologic: Grossly intact, no focal deficits Psychiatric: Normal mood and affect  I have reviewed prior pt notes  I have independently reviewed prior imaging--IR notes  I have reviewed prior CT scan images  Most recent hospital notes reviewed  Most recent nephrostomy tube change notes reviewed   Impression/Assessment:  1.  Neurogenic bladder with suprapubic tube.  He has an atrophic right kidney but does have urinary output from his suprapubic tube.  We will continue to leave that in and change it  2.  Left hydronephrosis with recurrent urolithiasis.  He is drained on that side with the percutaneous nephrostomy tube which has been working well for him.  3.  10 mm left renal stone, currently asymptomatic, and a very cachectic individual  Plan:  1.  For now, I do not think he needs management of the stone  2.  He will continue regular nephrostomy and catheter changes  3.  I will see back in about 3 months at the time of his suprapubic tube change

## 2023-10-12 NOTE — Patient Outreach (Addendum)
  Care Coordination   10/12/2023 late entry for 10/08/23  Name: Jordan Ward MRN: [id number] DOB: Aug 15, 1941   Care Coordination Outreach Attempts:  An unsuccessful telephone outreach was attempted today to offer the patient information about available complex care management services.  Follow Up Plan:  Additional outreach attempts will be made to offer the patient complex care management information and services.   Encounter Outcome:  Patient Visit Completed   Care Coordination Interventions:  No, not indicated NOTED PATIENT WITH INPATIENT ADMISSION SINCE 09/29/23    Breck [place] L. Noelle Penner, RN, BSN, [facility]  VBCI Care Management Coordinator  [phone]  Fax: [phone]

## 2023-10-12 NOTE — Care Management Important Message (Signed)
 Important Message  Patient Details  Name: Jordan Ward MRN: [id number] Date of Birth: January 14, 1941   Important Message Given:  Yes - Medicare IM     Corey Harold 10/12/2023, 11:58 AM

## 2023-10-13 ENCOUNTER — Ambulatory Visit (INDEPENDENT_AMBULATORY_CARE_PROVIDER_SITE_OTHER): Payer: Medicare Other | Admitting: Urology

## 2023-10-13 VITALS — BP 103/56 | HR 89

## 2023-10-13 DIAGNOSIS — N200 Calculus of kidney: Secondary | ICD-10-CM | POA: Diagnosis not present

## 2023-10-13 DIAGNOSIS — N319 Neuromuscular dysfunction of bladder, unspecified: Secondary | ICD-10-CM

## 2023-10-13 DIAGNOSIS — N1330 Unspecified hydronephrosis: Secondary | ICD-10-CM | POA: Diagnosis not present

## 2023-10-13 DIAGNOSIS — Z9359 Other cystostomy status: Secondary | ICD-10-CM

## 2023-10-13 DIAGNOSIS — R339 Retention of urine, unspecified: Secondary | ICD-10-CM

## 2023-10-13 DIAGNOSIS — N261 Atrophy of kidney (terminal): Secondary | ICD-10-CM

## 2023-10-13 NOTE — Progress Notes (Signed)
 Suprapubic Cath Change  Patient is present today for a suprapubic catheter change due to urinary retention.  10ml of water  was drained from the balloon, a 24FR foley cath was removed from the tract with out difficulty.  Site was cleaned and prepped in a sterile fashion with betadine .  A 24FR foley cath was replaced into the tract no complications were noted. Urine return was noted, 10 ml of sterile water  was inflated into the balloon and a leg bag was attached for drainage.  Patient tolerated well.     Performed by: Karyl Sharrar LPN  Follow up: Per MD note

## 2023-10-13 NOTE — LTC Provider Review (Signed)
 SPEECH LANGUAGE PATHOLOGY Clinical Swallowing Evaluation, Speech, Language, Cognitive-Communication Evaluation  Patient Name:  Jordan Ward      Medical Record Number: [id number]  Date of Birth: 1940/10/17  Sex: Male       Location:[facility] Physician:Hasanaj, Yancey       ASSESSMENT  Swallowing: Patient presents with suspected mild-mod dysphagia. Clinical assessment remarkable for lft droop/reduced strength via all structures, constent coughing (positive for pneumonia), residue in oral cavity requiring SLP to complete finger sweep to clear; reduced/delayed laryngeal elevation/excursion; volitional swallow and cough.  At this time, patient appropriate for downgrade to Soft & Bite-sized, Level 6, Thin Liquids (no straws) diet. Speech/Language/Cognitive-Communication:Patient presents with moderate cognitive-communication impairment with primary deficits in the areas of problem solving, executive functioning, organizational thought, safety awareness, reasoning. Expressive and receptive language skills are judged to be WNL for  conversation and to follow commands. Speech intelligibility and vocal quality are functional for age and gender.  Strengths include ability to follow simple commands in order to complete tasks, motivation, family support. Patient will benefit from SLP services given It is recommended that pt participate in skilled ST intervention targeting cognition and dysphaiga in order to return to PLOF, achieve a safe and favorable dc, reduce risk of aspiraition, diet tolerance..  Problem List: Decreased cognition, Difficulty masticating/swallowing, Treatment Codes SLP Treatment Codes: R41.841 R13.10   PLAN Recommended Interventions:  Cognitive-Communication Intervention, Dysphagia Intervention    Solids recommendations: Soft & Bite-sized, Level 6 Liquids recommendations:Thin Liquids (no straws) Swallowing Strategies: Upright as possible for  all oral intake, No straws, Remain upright for 20-30 minutes after meals, Alternate solids and liquids Meds recommendations:   Additional Recommendations:      Recommended Compensatory Strategies: Re-orient patient as needed, Keep lights on and blinds open during the day, Explain care as it's being provided    ST Plan: Skilled SLP   Discharge Recommendations:      Follow-up/Frequency: 1x per day for 4-5 days per week Planned Treatment Duration: 30 days  SLP Rehab Potential: Good  Prognostic Indicators:good family support, high patient motivation  ; may be limited by      Short Term Goals: Short Term Goal 1: Patient will exhibit funcational concrete reasoning with 50% of opportunities in order to facilitate safe return to prior level of living, promote independence, safely particapte  with activites of choice, facilitate safety within current environement. Date Established : 10/13/23 Time Frame : 2 weeks      Short Term Goal 2: Patient will perform functional planning/thought organization tasks with 60% accuracy in order to facilitate safety within current environment, increase safety during daily living tasks, return to PLOF. Date Established : 10/13/23 Time Frame : 2 weeks      Short Term Goal 3: Patient will exhibit good safety awareness during daily living tasks with 65% of opportunites in order to decrease risk for falls, increase ability to participate in ADLs, return to prior level of living, participate in functional acitivites of choice, safely return to home/community Date Established : 10/13/23 Time Frame : 2 weeks      Short Term Goal 4: Patient will complete oral motor and laryngeal elevation/excursion  exercises given skilled instruction with mod assistance in order to facilitate safe and effective mastication, improve ROM and strength, to facilitate bolus control and cohesion, to improve oral musculature functioning to increase clearance of oral residue, to improve function of  oral musculature during oral intake. Date Established : 10/13/23 Time Frame :  2 weeks      Short Term Goal 5: Patient will increase ability to clear oral cavity of solid to mod as a result of increased sensation/awareness to bolus; in order to increase safe oral intake; using tongue sweep or finger sweep re-swallow strategy. Date Established : 10/13/23 Time Frame : 2 weeks      Short Term Goal 6: Patient will demonstrate adequate ability to safely swallow thin and regular Date Established : 10/13/23 Time Frame : 2 weeks      Long Term Goals: Long Term Goal #1: Patient will increase ability to function safely without additional assistance/supervision due to cognitive deficits to 80-95% of the time in order to demonstrate improved problem solving/cogntion skills. Date Established : 10/13/23 Time Frame : 30 Days      Long Term Goal #2: Patient will safely and effectively swallow thin and regular 95% of the time without s/sx of aspiration in order to acheive nutriation and hydration needs with min risks of aspiration. Date Established : 10/13/23 Time Frame : 30 Days             No past medical history on file. Social History   Tobacco Use  . Smoking status: Not on file  . Smokeless tobacco: Not on file  Substance Use Topics  . Alcohol  use: Not on file    No past surgical history on file. No family history on file.   Allergies: Tetracyclines and Ciprofloxacin    PT Precautions:  ,   OT Precautions:  ,   SLP Precautions: Aspiration Precautions, Falls,   Weight Bearing:    SUBJECTIVE  83 y.o. year old patient with recent hospitalization due to Pt with recent hospialization d/t COVID-19 pneumonia.[facility] Hospital course complicated by dysphagia. Past medical history significant for CVA. Referred by: Orpha Haley on 10/13/23 for Swallow Evaluation, Evaluation Speech, Language, Voice, Cognition given It is recommended that pt particapte in skilled swallow and cognitive evaluation in order to  follow-up via hospitalization, provide appropraite interventions, return to PLOF and acheive a safe and favorable dc..  Patient is currently receiving a Soft & Bite Sized, Level 6 and thin liquids (no restrictions) (no straws) diet.     Prior Level of Function: Pt reported that he resides with spouse and daughter. Pt reported he completes med mgt tasks. Pt reported wife completes finances. Pt has 3 steps to enter home. Pt utilized walker. No driving.    Communication Preference: Verbal  Previous SLP Services: Per admission summary pt particpated in skilled ST intervention during hospitalization Medical Tests/ Procedures:     Services receiving:    Patient reports:    Patient/Caregiver Goals:    Family/Caregiver Present: No   Family/Caregiver reports:   Caregiver Identified:   Caregiver Availability:   Caregiver Ability:    OBJECTIVE  Patient positioning during assessment: Partially reclined in bed    Oral/Motor:  Apperance of Oral Cavity: scattered dententian, residue noted in rgt lateral sulci Labial ROM: Reduced left Labial Symmetry: Abnormal symmetry left Labial Strength: Reduced Lingual ROM: Reduced left Lingual Symmetry: Abnormal symmetry left Lingual Strength: Reduced Lingual Sensation: Within Functional Limits Facial ROM: Reduced left Facial Symmetry: Left droop Dentition: Some missing teeth  Swallowing: Ability to self-feed: was able to self-feed   Volitional Cough:Present Volitional Swallow:Present Consistencies Tested: Thin Liquids, Solid  Oral Phase:lft sided droop/weakness, residue in oral cavity, scattered dententian Signs/Symptoms of Aspiration: coughing however pt is positive for pneumoina.   Cognition: [facility] Mental Status (SLUMS) Examination was  administered with the following scores:  Sub-Test Raw Score Comments  Orientation 2/3 Impaired for day of the week, current year, and state of residence.   Mental Calculation 1/3  Impaired for adding 20+3, subtracting 100-23 in math word problem.   Animal Naming 1/3 Impaired for naming 15+ animals in 1 minute  Word Recall 0/5 Impaired for recall of 5 words following 5 minute delay with distraction  Digit Span Backwards 0/2 Impaired for repeating 3 and 4 digits backwards  Clock Drawing 0/4 Impaired for hour marker and correct time  Visuospatial/command following 2/2 WNL  for following basic commands using geometric shapes  Story Recall 2/8 Impaired for answering comprehension questions pertaining to short story  Total Score 8/30 Indicating Dementia   Normal: 27-30, Mild Neurological Cognitive Impairment: 21-26, Dementia Level Impairment: 1-20 (High School Education) Normal: 25-30, Mild Neurological Cognitive Impairment: 20-24, Dementia Level Impairment: 1-19 Dementia (LessThan High School Education)  Comments:   Cognitive Pattern Assessment Used: BIMS  Repetition of Three Words (First Attempt): 3 Temporal Orientation: Year: Missed by more than 5 years Temporal Orientation: Month: Accurate within 5 days Temporal Orientation: Day: Correct Recall: Sock: Yes, no cue required Recall: Blue: Yes, no cue required Recall: Bed: Yes, no cue required BIMS Summary Score: 12          Hearing Exceptions: None Vision: Glasses present  Informed Consent provided:Treatment plan, including benefits, risk and alternatives discussed with patient and/or family, who agree to treatment.   DAILY TREATMENT NOTE:  Swallow Treatment: Pt was reclined in bed consuming meal of reg and thin presenting with coughing/reported choking. ST provided assistance and cues in order to assist pt with clearance. Pt was repositioned in bed for safe intake. Pt is positive for covid-19 pneumonia thus possible cause for continuous cough. OME: reduced strength and ROM of labial and lingual structures (lft), volitional cough and swallow achieved, reduced laryngeal elevation/excursion. Pt was seen with  straw sips of thin presenting with immediate cough. Pt reported use of cup at home. Straw was removed presenting with min improvement of tolerance. Pt presented with max oral residue requiring SLP finger sweep in order to clear. It is recommended that pt diet be downgraded to SB6 and thin with no straws. It is recommended that pt participate in skilled ST intervention.  Cognitive-Communication Treatment: PT participated in structured interview providing social/medical/home hx. Pt was administered SLUMS and BIMS. Results were reviewed. POC was developed. PT was agreeable.      I attest that I have reviewed the above information. Signed: Ulanda Moats, SLP Fredricka 10/13/2023

## 2023-10-14 ENCOUNTER — Telehealth: Payer: Self-pay | Admitting: Neurology

## 2023-10-14 NOTE — Telephone Encounter (Signed)
 Pt's wife called wanting to speak to the RN or NP to discuss a change in the pt's medication. She states that her husband just had another mini stroke and they put him on clopidogrel  (PLAVIX ) 75 MG tablet  and Baby Asprin. She stated that they have never put him on clopidogrel  (PLAVIX ) 75 MG tablet when he had the other mini strokes before and she wants to make sure that it is ok for him to be on this medication. Please advise.

## 2023-10-15 NOTE — Telephone Encounter (Signed)
Called and spoke to pt spouse and Scheduled for 10/18/22

## 2023-10-15 NOTE — Telephone Encounter (Signed)
Unable to leave msg on pts personal cell phone

## 2023-10-15 NOTE — Telephone Encounter (Signed)
I reviewed hospital note, went to the ER reporting left-sided leg weakness, increased slurred speech, generalized weakness, shortness of breath.  Was admitted for COVID PNA.  Found to have acute punctate infarct within the right occipital lobe, left occipital lobe, 14 mm acute infarct within the left cerebellar hemisphere.  Chronic lacunar infarcts noted.  MRA head moderate/severe focal stenosis within cavernous left internal carotid artery, left anterior cerebral artery proximal A2 segment, severe stenosis left posterior cerebral artery branch at the P3/P4 junction. Two 3 mm, foci along the cavernous left internal carotid artery suspicious for arterial aneurysm.  CTA head recommended  He had a telehealth neuro consult with Dr. Felix Ahmadi.  Mention very small strokes in posterior circulation.  Suspected they were too small to have significant clinical manifestation but due to atherosclerosis started aspirin and Plavix.  Loop recorder as outpatient.   -Echo 30 to 35% EF, left ventricle moderately decreased function, demonstrates global hypokinesis. -A1c 6.1 -LDL 16  He needs hospital follow up at our office.

## 2023-10-15 NOTE — Telephone Encounter (Signed)
1st attempt lmtrc

## 2023-10-19 ENCOUNTER — Encounter: Payer: Self-pay | Admitting: Neurology

## 2023-10-19 ENCOUNTER — Ambulatory Visit (INDEPENDENT_AMBULATORY_CARE_PROVIDER_SITE_OTHER): Payer: Medicare Other | Admitting: Neurology

## 2023-10-19 VITALS — BP 85/58 | HR 71 | Ht 65.0 in

## 2023-10-19 DIAGNOSIS — I6310 Cerebral infarction due to embolism of unspecified precerebral artery: Secondary | ICD-10-CM

## 2023-10-19 DIAGNOSIS — G35 Multiple sclerosis: Secondary | ICD-10-CM

## 2023-10-19 DIAGNOSIS — R29898 Other symptoms and signs involving the musculoskeletal system: Secondary | ICD-10-CM | POA: Diagnosis not present

## 2023-10-19 DIAGNOSIS — I6381 Other cerebral infarction due to occlusion or stenosis of small artery: Secondary | ICD-10-CM | POA: Diagnosis not present

## 2023-10-19 NOTE — Progress Notes (Signed)
[facility] [address] [place]. [place] [address] [phone]       OFFICE FOLLOW-UP NOTE  [name] Date of Birth:  May 04, 1941 Medical Record Number:  [id number]   HPI: Jordan Ward is a 83 year old Caucasian male seen today for office follow-up visit following hospital admission in December 2024 for recurrent UTI during which was found to have silent bilateral strokes on MRI.  History is obtained from the patient and his wife as well as review of electronic medical records.  I personally reviewed pertinent available imaging films in PACS.  He has past medical history of remote multiple sclerosis, chronic kidney disease stage IV, neurogenic bladder with suprapubic cystostomy catheter, left ureter obstruction with nephrostomy tube, COPD, emphysema, home oxygen, hypothyroidism, dysphagia, nonhealing wound right ankle, pericerebral arterial disease, heart failure, cerebrovascular disease.  Patient presented on 09/29/2023 with several days of feeling short of breath, coughing and with increased left sided weakness and slurred speech.  He was admitted with COVID-pneumonia and also found to have strep pneumonia bacteremia and was treated with 10-day course of IV antibiotics.  MRI scan of the brain showed tiny bilateral occipital white matter and left cerebellar infarcts which were felt by teleneurologist to be asymptomatic and not explaining his symptoms.  Patient was transferred to skilled nursing facility for rehab but he states he is speech and swallowing have improved a bit but left leg weakness has not improved at all.  He has significant weakness in isolation in his left leg but not much weakness in his left arm face.  He does have remote history of multiple sclerosis and family is wondering if this could be related to his MS since the infarct seen on MRI could not explain such significant new left leg weakness.  Patient was discharged on dual antiplatelet therapy as his MR  angiogram of the brain had shown occluded right vertebral artery and multifocal atherosclerotic changes involving moderate stenosis of left cavernous ICA, left anterior cerebral artery in the A2 segment and severe stenosis of left posterior cerebral artery and the P3/P4 segments.  Echocardiogram had shown diminished ejection fraction of 30 to 35% with global hypokinesis.  LDL cholesterol 60 mg percent and hemoglobin A1c was 6.1.  Patient is presently at [facility] rehabilitation center is getting daily physical occupational therapy but he is not able to walk and needs two-person assist to even stand.  He is not able to take any steps yet. ROS:   14 system review of systems is positive for leg weakness, difficulty walking, inability to stand, slurred speech, difficulty swallowing all other systems negative  PMH:  Past Medical History:  Diagnosis Date   Anemia    Arthritis    Back pain, chronic    Bilateral carotid bruits    C. difficile colitis 09/2011   CAD (coronary artery disease)    Carotid artery stenosis    Cerebrovascular disease    Cerebrovascular disease 08/17/2018   Colon polyps    Complication of cystostomy catheter, initial encounter (HCC) 04/20/2020   Dyslipidemia    Dysphagia 10/07/2011   Encephalopathy    Gait disorder    HA (headache)    High grade dysplasia in colonic adenoma 09/2005   History of kidney stones    HTN (hypertension), malignant 10/06/2011   Hypernatremia    Hypokalemia    Hypothyroidism 10/08/2011   Insomnia    Junctional rhythm    Kidney stones    MS (multiple sclerosis) (HCC)    Nausea  and vomiting 03/02/2020   Neuromuscular disorder (HCC)    MS   OSA (obstructive sleep apnea)    Paroxysmal atrial tachycardia (HCC)    Peripheral vascular disease (HCC)    Pneumonia 4 yrs ago   Pulmonary fibrosis (HCC) 10/06/2011   Pulmonary nodule 10/08/2011   PVD (peripheral vascular disease) (HCC)    Sacral ulcer (HCC)    Sleep apnea    cannot tolerate    Stroke (HCC)    left sided weakness   Suprapubic catheter (HCC)    TIA (transient ischemic attack)    Tremors of nervous system 10/08/2011   Urinary tract infection    Ventral hernia without obstruction or gangrene     Large 8X9cm ventral hernia with loss of domain. CT reads report as diastasis recti with herniation or diastasis recti.  Dr. Henreitta Leber, Surgery, reviewed CT with radiology and there is herniation with only hernia sac or peritoneum over the bowel and large separation of the rectus muslce (i.e. diastasis recti aka loss of domain).  No surgical intervention recommended given size, age, and health.     Social History:  Social History   Socioeconomic History   Marital status: Married    Spouse name: Marvia Pickles   Number of children: 2   Years of education: 10   Highest education level: Not on file  Occupational History   Occupation: disability    Comment: [employer]    Employer: RETIRED  Tobacco Use   Smoking status: Former    Current packs/day: 0.00    Average packs/day: 1 pack/day for 25.0 years (25.0 ttl pk-yrs)    Types: Cigarettes    Start date: 03/04/1963    Quit date: 03/03/1988    Years since quitting: 35.6   Smokeless tobacco: Never  Vaping Use   Vaping status: Never Used  Substance and Sexual Activity   Alcohol use: No   Drug use: No   Sexual activity: Not on file  Other Topics Concern   Not on file  Social History Narrative   Patient is married Marvia Pickles) and  Lives w/ wife and daughter Jasmine December   Drinks 1-2 cups caffeine daily    Patient is left handed.    Social Drivers of Corporate investment banker Strain: Low Risk  (09/26/2022)   Overall Financial Resource Strain (CARDIA)    Difficulty of Paying Living Expenses: Not hard at all  Food Insecurity: No Food Insecurity (09/30/2023)   Hunger Vital Sign    Worried About Running Out of Food in the Last Year: Never true    Ran Out of Food in the Last Year: Never true  Transportation Needs: No Transportation  Needs (09/30/2023)   PRAPARE - Administrator, Civil Service (Medical): No    Lack of Transportation (Non-Medical): No  Physical Activity: Not on file  Stress: No Stress Concern Present (09/26/2022)   [employer] of Occupational Health - Occupational Stress Questionnaire    Feeling of Stress : Only a little  Social Connections: Socially Isolated (09/30/2023)   Social Connection and Isolation Panel [NHANES]    Frequency of Communication with Friends and Family: Never    Frequency of Social Gatherings with Friends and Family: Never    Attends Religious Services: Never    Database administrator or Organizations: No    Attends Banker Meetings: Never    Marital Status: Married  Catering manager Violence: Not At Risk (09/30/2023)   Humiliation, Afraid, Rape, and Kick  questionnaire    Fear of Current or Ex-Partner: No    Emotionally Abused: No    Physically Abused: No    Sexually Abused: No    Medications:   Current Outpatient Medications on File Prior to Visit  Medication Sig Dispense Refill   acetaminophen (TYLENOL) 325 MG tablet Take 650 mg by mouth every 6 (six) hours as needed for fever.     amLODipine (NORVASC) 10 MG tablet Take 5 mg by mouth every evening. After supper.     aspirin EC 81 MG tablet Take 1 tablet (81 mg total) by mouth daily. Swallow whole. 150 tablet 2   benzonatate (TESSALON) 100 MG capsule Take 1 capsule (100 mg total) by mouth 3 (three) times daily as needed for cough. 20 capsule 0   diazepam (VALIUM) 5 MG tablet Take 1 tablet (5 mg total) by mouth 2 (two) times daily. 6 tablet 0   HYDROcodone-acetaminophen (NORCO) 7.5-325 MG tablet Take 1 tablet by mouth 2 (two) times daily. Max APAP 3 GM IN 24 HOURS FROM ALL SOURCES 6 tablet 0   Ipratropium-Albuterol (COMBIVENT RESPIMAT) 20-100 MCG/ACT AERS respimat Inhale 1 puff into the lungs every 6 (six) hours as needed. 4 g 1   levothyroxine (SYNTHROID) 75 MCG tablet Take 75 mcg by mouth in the  morning.     loperamide (IMODIUM) 2 MG capsule Take 1 capsule (2 mg total) by mouth 4 (four) times daily as needed for diarrhea or loose stools. 30 capsule 0   mirabegron ER (MYRBETRIQ) 50 MG TB24 tablet Take 1 tablet (50 mg total) by mouth daily. 30 tablet 2   OXYGEN Inhale 1 L into the lungs every evening.     pantoprazole (PROTONIX) 40 MG tablet Take 1 tablet (40 mg total) by mouth daily. 30 tablet 2   polyvinyl alcohol (LIQUIFILM TEARS) 1.4 % ophthalmic solution Place 1 drop into both eyes 4 (four) times daily as needed for dry eyes. 15 mL 0   potassium chloride SA (KLOR-CON) 20 MEQ tablet Take 20 mEq by mouth every evening. After supper     thiamine (VITAMIN B-1) 100 MG tablet Take 1 tablet (100 mg total) by mouth daily. 30 tablet 0   No current facility-administered medications on file prior to visit.    Allergies:   Allergies  Allergen Reactions   Tetracyclines & Related Anaphylaxis and Rash   Cipro [Ciprofloxacin Hcl] Other (See Comments)    "Larey Seat out in the floor"    Physical Exam General: Frail malnourished looking elderly Caucasian male seated, in no evident distress Head: head normocephalic and atraumatic.  Neck: supple with no carotid or supraclavicular bruits Cardiovascular: regular rate and rhythm, no murmurs Musculoskeletal: no deformity Skin:  no rash/petichiae Vascular:  Normal pulses all extremities Vitals:   10/19/23 1547  BP: (!) 85/58  Pulse: 71   Neurologic Exam Mental Status: Awake and fully alert. Oriented to place and time. Recent and remote memory intact. Attention span, concentration and fund of knowledge appropriate. Mood and affect appropriate.  Cranial Nerves: Fundoscopic exam reveals sharp disc margins. Pupils equal, briskly reactive to light. Extraocular movements full without nystagmus. Visual fields full to confrontation. Hearing intact. Facial sensation intact.  Mild left lower facial asymmetry., tongue, palate moves normally and symmetrically.   Motor: Left leg weakness with 2/5 strength both proximally and distally with left foot drop.  Normal strength in the other 3 extremities.  Diminished fine finger movements on the left and orbits right over left upper extremity.  Tone is slightly increased in both legs   no spasticity.   Sensory.: intact to touch ,pinprick .position and vibratory sensation.  Coordination: Rapid alternating movements normal in all extremities. Finger-to-nose and heel-to-shin performed accurately bilaterally. Gait and Station: Patient sitting in wheelchair.  He is unable to stand even with two-person assistance gait deferred Reflexes: 2+ and symmetric. Toes downgoing.   NIHSS  4 Modified Rankin  4   ASSESSMENT: 83 year old Caucasian male with bilateral occipital white matter and left cerebellar infarcts likely due to combination of small vessel disease and intracranial atherosclerosis in December 2024 with new complaints of left leg weakness which cannot be explained by the above infarcts given prior history of multiple sclerosis will image spine to look for compressive or demyelinating lesions.     PLAN:I had a long discussion with the patient and his wife regarding his recent admissions for recurrent UTI and left leg weakness and I discussed results of MRI scan which do shows multiple small infarcts but they are not in a location to explain his leg weakness.  Given his prior history of remote multiple sclerosis I would recommend imaging MRI scan of cervical, thoracic and lumbar spine.  Continue aspirin alone for stroke prevention and discontinue Plavix as it has been 3 weeks.  Recommend 30-day heart monitor to look for paroxysmal A-fib.  Maintain aggressive risk factor modification with strict control of hypertension with blood pressure goal below 130/90, lipids with LDL cholesterol goal below 70 mg percent and diabetes with hemoglobin A1c goal below 6.5%.  Continue ongoing physical  and occupational therapy.  Return  for follow-up in the future in 3 months with nurse practitioner Sarah or call earlier if necessary  Greater than 50% of time during this 40 minute visit was spent on counseling,explanation of diagnosis of lacunar strokes, intracranial stenosis, history of multiple sclerosis, planning of further management, discussion with patient and family and coordination of care Delia Heady, MD Note: This document was prepared with digital dictation and possible smart phrase technology. Any transcriptional errors that result from this process are unintentional

## 2023-10-19 NOTE — Patient Instructions (Addendum)
I had a long discussion with the patient and his wife regarding his recent admissions for recurrent UTI and left leg weakness and I discussed results of MRI scan which do shows multiple small infarcts but they are not in a location to explain his leg weakness.  Given his prior history of remote multiple sclerosis I would recommend imaging MRI scan of cervical, thoracic and lumbar spine.  Continue aspirin alone for stroke prevention and discontinue Plavix as it has been 3 weeks.  Recommend 30-day heart monitor to look for paroxysmal A-fib.  Maintain aggressive risk factor modification with strict control of hypertension with blood pressure goal below 130, 90, lipids with LDL cholesterol goal below 70 mg percent and diabetes with hemoglobin A1c goal below 6.5%.  Continue ongoing physical  and occupational therapy.  Return for follow-up in the future in 3 months with nurse practitioner Sarah or call earlier if necessary

## 2023-10-22 ENCOUNTER — Telehealth: Payer: Self-pay | Admitting: Neurology

## 2023-10-22 DIAGNOSIS — G35 Multiple sclerosis: Secondary | ICD-10-CM

## 2023-10-22 DIAGNOSIS — R29898 Other symptoms and signs involving the musculoskeletal system: Secondary | ICD-10-CM

## 2023-10-22 NOTE — Telephone Encounter (Signed)
[facility] calling with questions about referral sent. Transferred

## 2023-10-22 NOTE — Addendum Note (Signed)
Addended by: Aura Camps on: 10/22/2023 04:42 PM   Modules accepted: Orders

## 2023-10-22 NOTE — Addendum Note (Signed)
Addended by: Aura Camps on: 10/22/2023 04:44 PM   Modules accepted: Orders

## 2023-10-22 NOTE — Telephone Encounter (Signed)
medicare/BCBS sup NPR sent to GI [phone] 

## 2023-10-22 NOTE — Telephone Encounter (Signed)
New orders placed and location placed for [facility] imaging.

## 2023-10-22 NOTE — Telephone Encounter (Signed)
She was calling from [facility] and asked that we put [facility] as the location on the three MRI orders please

## 2023-10-28 ENCOUNTER — Observation Stay (HOSPITAL_COMMUNITY)
Admission: EM | Admit: 2023-10-28 | Discharge: 2023-10-29 | Disposition: A | Discharge status: Home / Self Care | Payer: Medicare Other | Attending: Family Medicine | Admitting: Family Medicine

## 2023-10-28 ENCOUNTER — Emergency Department (HOSPITAL_COMMUNITY): Payer: Medicare Other

## 2023-10-28 ENCOUNTER — Encounter (HOSPITAL_COMMUNITY): Payer: Self-pay

## 2023-10-28 ENCOUNTER — Other Ambulatory Visit: Payer: Self-pay

## 2023-10-28 DIAGNOSIS — F419 Anxiety disorder, unspecified: Secondary | ICD-10-CM | POA: Diagnosis not present

## 2023-10-28 DIAGNOSIS — Z436 Encounter for attention to other artificial openings of urinary tract: Secondary | ICD-10-CM | POA: Diagnosis not present

## 2023-10-28 DIAGNOSIS — R109 Unspecified abdominal pain: Secondary | ICD-10-CM | POA: Diagnosis not present

## 2023-10-28 DIAGNOSIS — N99528 Other complication of other external stoma of urinary tract: Secondary | ICD-10-CM | POA: Diagnosis not present

## 2023-10-28 DIAGNOSIS — T83022A Displacement of nephrostomy catheter, initial encounter: Secondary | ICD-10-CM | POA: Diagnosis not present

## 2023-10-28 DIAGNOSIS — Z8673 Personal history of transient ischemic attack (TIA), and cerebral infarction without residual deficits: Secondary | ICD-10-CM | POA: Diagnosis not present

## 2023-10-28 DIAGNOSIS — Z7982 Long term (current) use of aspirin: Secondary | ICD-10-CM | POA: Insufficient documentation

## 2023-10-28 DIAGNOSIS — I10 Essential (primary) hypertension: Secondary | ICD-10-CM | POA: Diagnosis not present

## 2023-10-28 DIAGNOSIS — E039 Hypothyroidism, unspecified: Secondary | ICD-10-CM | POA: Diagnosis not present

## 2023-10-28 DIAGNOSIS — Z87891 Personal history of nicotine dependence: Secondary | ICD-10-CM | POA: Diagnosis not present

## 2023-10-28 DIAGNOSIS — E785 Hyperlipidemia, unspecified: Secondary | ICD-10-CM

## 2023-10-28 DIAGNOSIS — N281 Cyst of kidney, acquired: Secondary | ICD-10-CM | POA: Diagnosis not present

## 2023-10-28 DIAGNOSIS — I2510 Atherosclerotic heart disease of native coronary artery without angina pectoris: Secondary | ICD-10-CM | POA: Insufficient documentation

## 2023-10-28 DIAGNOSIS — Z9049 Acquired absence of other specified parts of digestive tract: Secondary | ICD-10-CM | POA: Diagnosis not present

## 2023-10-28 DIAGNOSIS — N132 Hydronephrosis with renal and ureteral calculous obstruction: Secondary | ICD-10-CM | POA: Diagnosis not present

## 2023-10-28 DIAGNOSIS — T83092A Other mechanical complication of nephrostomy catheter, initial encounter: Secondary | ICD-10-CM | POA: Diagnosis not present

## 2023-10-28 LAB — CBC WITH DIFFERENTIAL/PLATELET
Abs Immature Granulocytes: 0.19 10*3/uL — ABNORMAL HIGH (ref 0.00–0.07)
Basophils Absolute: 0.1 10*3/uL (ref 0.0–0.1)
Basophils Relative: 0 %
Eosinophils Absolute: 0 10*3/uL (ref 0.0–0.5)
Eosinophils Relative: 0 %
HCT: 33.1 % — ABNORMAL LOW (ref 39.0–52.0)
Hemoglobin: 10.4 g/dL — ABNORMAL LOW (ref 13.0–17.0)
Immature Granulocytes: 1 %
Lymphocytes Relative: 11 %
Lymphs Abs: 1.6 10*3/uL (ref 0.7–4.0)
MCH: 29.2 pg (ref 26.0–34.0)
MCHC: 31.4 g/dL (ref 30.0–36.0)
MCV: 93 fL (ref 80.0–100.0)
Monocytes Absolute: 1 10*3/uL (ref 0.1–1.0)
Monocytes Relative: 7 %
Neutro Abs: 11 10*3/uL — ABNORMAL HIGH (ref 1.7–7.7)
Neutrophils Relative %: 81 %
Platelets: 427 10*3/uL — ABNORMAL HIGH (ref 150–400)
RBC: 3.56 MIL/uL — ABNORMAL LOW (ref 4.22–5.81)
RDW: 14.8 % (ref 11.5–15.5)
WBC: 13.9 10*3/uL — ABNORMAL HIGH (ref 4.0–10.5)
nRBC: 0 % (ref 0.0–0.2)

## 2023-10-28 LAB — URINALYSIS, ROUTINE W REFLEX MICROSCOPIC
Bilirubin Urine: NEGATIVE
Glucose, UA: NEGATIVE mg/dL
Ketones, ur: NEGATIVE mg/dL
Nitrite: POSITIVE — AB
Protein, ur: 30 mg/dL — AB
RBC / HPF: >50 RBC/hpf (ref 0–5)
Specific Gravity, Urine: 1.009 (ref 1.005–1.030)
pH: 7 (ref 5.0–8.0)

## 2023-10-28 LAB — BASIC METABOLIC PANEL
Anion gap: 11 (ref 5–15)
BUN: 22 mg/dL (ref 8–23)
CO2: 23 mmol/L (ref 22–32)
Calcium: 8.7 mg/dL — ABNORMAL LOW (ref 8.9–10.3)
Chloride: 100 mmol/L (ref 98–111)
Creatinine, Ser: 1.79 mg/dL — ABNORMAL HIGH (ref 0.61–1.24)
GFR, Estimated: 38 mL/min — ABNORMAL LOW (ref >60)
Glucose, Bld: 116 mg/dL — ABNORMAL HIGH (ref 70–99)
Potassium: 3.8 mmol/L (ref 3.5–5.1)
Sodium: 134 mmol/L — ABNORMAL LOW (ref 135–145)

## 2023-10-28 MED ORDER — LEVOTHYROXINE SODIUM 75 MCG PO TABS: 75.0000 ug | ORAL_TABLET | Freq: Every morning | ORAL | Status: DC

## 2023-10-28 MED ORDER — ONDANSETRON HCL 4 MG PO TABS: 4.0000 mg | ORAL_TABLET | Freq: Four times a day (QID) | ORAL | Status: DC | PRN

## 2023-10-28 MED ORDER — ENOXAPARIN SODIUM 30 MG/0.3ML IJ SOSY
30.0000 mg | PREFILLED_SYRINGE | INTRAMUSCULAR | Status: DC
  Administered 2023-10-28 – 2023-10-29 (×2): 30 mg via SUBCUTANEOUS
  Filled 2023-10-28 (×2): qty 0.3

## 2023-10-28 MED ORDER — IPRATROPIUM-ALBUTEROL 0.5-2.5 (3) MG/3ML IN SOLN: 3.0000 mL | Freq: Four times a day (QID) | RESPIRATORY_TRACT | Status: DC | PRN

## 2023-10-28 MED ORDER — HYDROCODONE-ACETAMINOPHEN 7.5-325 MG PO TABS
1.0000 | ORAL_TABLET | Freq: Two times a day (BID) | ORAL | Status: DC
  Administered 2023-10-29 (×2): 1 via ORAL
  Filled 2023-10-28 (×2): qty 1

## 2023-10-28 MED ORDER — IPRATROPIUM-ALBUTEROL 20-100 MCG/ACT IN AERS: 1.0000 | INHALATION_SPRAY | Freq: Four times a day (QID) | RESPIRATORY_TRACT | Status: DC | PRN

## 2023-10-28 MED ORDER — PANTOPRAZOLE SODIUM 40 MG PO TBEC: 40.0000 mg | DELAYED_RELEASE_TABLET | Freq: Every day | ORAL | Status: DC

## 2023-10-28 MED ORDER — MAGNESIUM HYDROXIDE 400 MG/5ML PO SUSP: 30.0000 mL | Freq: Every day | ORAL | Status: DC | PRN

## 2023-10-28 MED ORDER — POLYVINYL ALCOHOL 1.4 % OP SOLN: 1.0000 [drp] | Freq: Four times a day (QID) | OPHTHALMIC | Status: DC | PRN

## 2023-10-28 MED ORDER — POTASSIUM CHLORIDE CRYS ER 20 MEQ PO TBCR
20.0000 meq | EXTENDED_RELEASE_TABLET | Freq: Every evening | ORAL | Status: DC
  Administered 2023-10-28 – 2023-10-29 (×2): 20 meq via ORAL
  Filled 2023-10-28 (×2): qty 1

## 2023-10-28 MED ORDER — BENZONATATE 100 MG PO CAPS: 100.0000 mg | ORAL_CAPSULE | Freq: Three times a day (TID) | ORAL | Status: DC | PRN

## 2023-10-28 MED ORDER — ASPIRIN 81 MG PO TBEC: 81.0000 mg | DELAYED_RELEASE_TABLET | Freq: Every day | ORAL | Status: DC

## 2023-10-28 MED ORDER — ACETAMINOPHEN 325 MG PO TABS
650.0000 mg | ORAL_TABLET | Freq: Four times a day (QID) | ORAL | Status: DC | PRN
  Administered 2023-10-29: 650 mg via ORAL
  Filled 2023-10-28: qty 2

## 2023-10-28 MED ORDER — LOPERAMIDE HCL 2 MG PO CAPS: 2.0000 mg | ORAL_CAPSULE | Freq: Four times a day (QID) | ORAL | Status: DC | PRN

## 2023-10-28 MED ORDER — THIAMINE MONONITRATE 100 MG PO TABS: 100.0000 mg | ORAL_TABLET | Freq: Every day | ORAL | Status: DC

## 2023-10-28 MED ORDER — ONDANSETRON HCL 4 MG/2ML IJ SOLN: 4.0000 mg | Freq: Four times a day (QID) | INTRAMUSCULAR | Status: DC | PRN

## 2023-10-28 MED ORDER — TRAZODONE HCL 50 MG PO TABS
25.0000 mg | ORAL_TABLET | Freq: Every evening | ORAL | Status: DC | PRN
Start: 2023-10-28 — End: 2023-10-30

## 2023-10-28 MED ORDER — DIAZEPAM 5 MG PO TABS
5.0000 mg | ORAL_TABLET | Freq: Two times a day (BID) | ORAL | Status: DC
  Administered 2023-10-28 – 2023-10-29 (×2): 5 mg via ORAL
  Filled 2023-10-28 (×3): qty 1

## 2023-10-28 MED ORDER — SODIUM CHLORIDE 0.9 % IV SOLN: INTRAVENOUS | Status: DC

## 2023-10-28 MED ORDER — ACETAMINOPHEN 650 MG RE SUPP
650.0000 mg | Freq: Four times a day (QID) | RECTAL | Status: DC | PRN
Start: 2023-10-28 — End: 2023-10-30

## 2023-10-28 MED ORDER — MIRABEGRON ER 25 MG PO TB24: 50.0000 mg | ORAL_TABLET | Freq: Every day | ORAL | Status: DC

## 2023-10-28 MED ORDER — AMLODIPINE BESYLATE 5 MG PO TABS
5.0000 mg | ORAL_TABLET | Freq: Every evening | ORAL | Status: DC
  Administered 2023-10-28 – 2023-10-29 (×2): 5 mg via ORAL
  Filled 2023-10-28 (×3): qty 1

## 2023-10-28 NOTE — ED Triage Notes (Signed)
Pt bib EMS, states pt was at [facility] rehab and needed nephrostomy placement. EMS stated [facility] does not have the employees or resources to do this. Per EMS, Pt wife requested for pt to go to [facility], but he is here.

## 2023-10-28 NOTE — ED Provider Notes (Addendum)
[facility] EMERGENCY DEPARTMENT AT [facility] Provider Note   CSN: [id number] Arrival date & time: 10/28/23  1331     History  Chief Complaint  Patient presents with   nephrostpmy placement    Jordan Ward is a 83 y.o. male.  Patient brought in by EMS from [facility].  Patient has a left-sided nephrostomy tube that came out completely.  Facility provided it did not break off.  Patient normally has his nephrostomy tubes replaced by interventional radiology at [facility]  They thought that is where he was going to be taken.  There was some conversations with interventional radiology they other plan to do it tomorrow.  May have been some conversations with urology there as well.  Patient also has a suprapubic catheter in place.  Not totally clear will try did review chart to see why he has these.  Definitely followed by [facility].  Family member says she has had the nephrostomy tube in 1 shape fashion are formed on the left side for about 4 years.  Most recently had that nephrostomy tube in the left side changed or exchanged September 15, 2023.  Patient only complaining of a little bit of left flank left CVA discomfort.  But no nausea or vomiting no fevers.  Past medical history is extensive multiple sclerosis peripheral vascular disease pulmonary fibrosis hypothyroidism hypertension history of atrial tachycardia PAT.  History of coronary artery disease.  History of suprapubic catheter sleep apnea.       Home Medications Prior to Admission medications   Medication Sig Start Date End Date Taking? Authorizing Provider  acetaminophen (TYLENOL) 325 MG tablet Take 650 mg by mouth every 6 (six) hours as needed for fever.    [provider]  amLODipine (NORVASC) 10 MG tablet Take 5 mg by mouth every evening. After supper. 05/21/21   [provider]  aspirin EC 81 MG tablet Take 1 tablet (81 mg total) by mouth daily. Swallow whole. 10/12/23 10/11/24  Sherryll Burger, Pratik  D, DO  benzonatate (TESSALON) 100 MG capsule Take 1 capsule (100 mg total) by mouth 3 (three) times daily as needed for cough. 10/12/23   Sherryll Burger, Pratik D, DO  diazepam (VALIUM) 5 MG tablet Take 1 tablet (5 mg total) by mouth 2 (two) times daily. 10/12/23   Sherryll Burger, Pratik D, DO  HYDROcodone-acetaminophen (NORCO) 7.5-325 MG tablet Take 1 tablet by mouth 2 (two) times daily. Max APAP 3 GM IN 24 HOURS FROM ALL SOURCES 10/12/23   Sherryll Burger, Pratik D, DO  Ipratropium-Albuterol (COMBIVENT RESPIMAT) 20-100 MCG/ACT AERS respimat Inhale 1 puff into the lungs every 6 (six) hours as needed. 10/12/23   Sherryll Burger, Pratik D, DO  levothyroxine (SYNTHROID) 75 MCG tablet Take 75 mcg by mouth in the morning. 07/02/21   [provider]  loperamide (IMODIUM) 2 MG capsule Take 1 capsule (2 mg total) by mouth 4 (four) times daily as needed for diarrhea or loose stools. 10/12/23   Sherryll Burger, Pratik D, DO  mirabegron ER (MYRBETRIQ) 50 MG TB24 tablet Take 1 tablet (50 mg total) by mouth daily. 10/12/23   Sherryll Burger, Pratik D, DO  OXYGEN Inhale 1 L into the lungs every evening.    [provider]  pantoprazole (PROTONIX) 40 MG tablet Take 1 tablet (40 mg total) by mouth daily. 10/12/23   Sherryll Burger, Pratik D, DO  polyvinyl alcohol (LIQUIFILM TEARS) 1.4 % ophthalmic solution Place 1 drop into both eyes 4 (four) times daily as needed for dry eyes. 10/12/23  Sherryll Burger, Pratik D, DO  potassium chloride SA (KLOR-CON) 20 MEQ tablet Take 20 mEq by mouth every evening. After supper 07/02/21   [provider]  thiamine (VITAMIN B-1) 100 MG tablet Take 1 tablet (100 mg total) by mouth daily. 10/12/23   Sherryll Burger, Pratik D, DO      Allergies    Tetracyclines & related and Cipro [ciprofloxacin hcl]    Review of Systems   Review of Systems  Constitutional:  Negative for chills and fever.  HENT:  Negative for ear pain and sore throat.   Eyes:  Negative for pain and visual disturbance.  Respiratory:  Negative for cough and shortness of breath.    Cardiovascular:  Negative for chest pain and palpitations.  Gastrointestinal:  Negative for abdominal pain and vomiting.  Genitourinary:  Positive for flank pain. Negative for dysuria and hematuria.  Musculoskeletal:  Negative for arthralgias and back pain.  Skin:  Negative for color change and rash.  Neurological:  Negative for seizures and syncope.  All other systems reviewed and are negative.   Physical Exam Updated Vital Signs BP 128/78 (BP Location: Left Arm)   Pulse 86   Temp 98.7 F (37.1 C) (Oral)   Resp 18   Ht 1.651 m (5\' 5" )   Wt 49.9 kg   SpO2 95%   BMI 18.30 kg/m  Physical Exam Vitals and nursing note reviewed.  Constitutional:      General: He is not in acute distress.    Appearance: He is well-developed.  HENT:     Head: Normocephalic and atraumatic.  Eyes:     Conjunctiva/sclera: Conjunctivae normal.  Cardiovascular:     Rate and Rhythm: Normal rate and regular rhythm.     Heart sounds: No murmur heard. Pulmonary:     Effort: Pulmonary effort is normal. No respiratory distress.     Breath sounds: Normal breath sounds.  Abdominal:     Palpations: Abdomen is soft.     Tenderness: There is no abdominal tenderness. There is no guarding.     Comments: Suprapubic catheter in place is draining properly.  Musculoskeletal:        General: No swelling.     Cervical back: Neck supple.     Comments: Left side old site of nephrostomy tube.  Obviously been in place for a long time.  Currently not there no drainage.  Skin:    General: Skin is warm and dry.     Capillary Refill: Capillary refill takes less than 2 seconds.  Neurological:     General: No focal deficit present.     Mental Status: He is alert. Mental status is at baseline.  Psychiatric:        Mood and Affect: Mood normal.     ED Results / Procedures / Treatments   Labs (all labs ordered are listed, but only abnormal results are displayed) Labs Reviewed  CBC WITH DIFFERENTIAL/PLATELET -  Abnormal; Notable for the following components:      Result Value   WBC 13.9 (*)    RBC 3.56 (*)    Hemoglobin 10.4 (*)    HCT 33.1 (*)    Platelets 427 (*)    Neutro Abs 11.0 (*)    Abs Immature Granulocytes 0.19 (*)    All other components within normal limits  BASIC METABOLIC PANEL - Abnormal; Notable for the following components:   Sodium 134 (*)    Glucose, Bld 116 (*)    Creatinine, Ser 1.79 (*)  Calcium 8.7 (*)    GFR, Estimated 38 (*)    All other components within normal limits  URINE CULTURE  URINALYSIS, ROUTINE W REFLEX MICROSCOPIC    EKG None  Radiology CT Renal Stone Study Result Date: 10/28/2023 CLINICAL DATA:  Flank pain, left nephrostomy catheter has been recently removed EXAM: CT ABDOMEN AND PELVIS WITHOUT CONTRAST TECHNIQUE: Multidetector CT imaging of the abdomen and pelvis was performed following the standard protocol without IV contrast. RADIATION DOSE REDUCTION: This exam was performed according to the departmental dose-optimization program which includes automated exposure control, adjustment of the mA and/or kV according to patient size and/or use of iterative reconstruction technique. COMPARISON:  07/01/2023 FINDINGS: Lower chest: Lung bases demonstrate diffuse fibrotic change similar to that noted on prior exam. More marked consolidation in the left lower lobe with bronchiectasis is seen. Hepatobiliary: Gallbladder has been surgically removed. Pneumobilia is noted increased from the prior exam consistent with prior intervention. No hepatic mass is noted. Pancreas: Unremarkable. No pancreatic ductal dilatation or surrounding inflammatory changes. Spleen: Normal in size without focal abnormality. Adrenals/Urinary Tract: Adrenal glands are within normal limits. The right kidney is shrunken stable in appearance from the prior exam. Stable nonobstructing stone measuring 6 mm is noted on the right. The left kidney demonstrates multiple cysts as well as hydronephrosis.  A renal pelvic stone measuring up to 10 mm is seen. Previously noted ostomy catheter is no longer identified. More distal left ureter is within normal limits. The bladder is decompressed by a suprapubic Foley. Stomach/Bowel: Scattered fecal material is noted throughout the colon. Postsurgical changes in the sigmoid colon consistent with left colectomy are noted. The appendix is not well visualized. This is consistent with the given clinical history. Small bowel is within normal limits. Stomach is decompressed. Vascular/Lymphatic: Aortic atherosclerosis. No enlarged abdominal or pelvic lymph nodes. Reproductive: Prostate is unremarkable. Other: Diastasis of the rectus muscles is noted anteriorly similar to that seen on the prior exam. Soft tissue density is noted along the course of the distal right inguinal canal. This likely represents the right testicle. Musculoskeletal: Degenerative changes of lumbar spine are noted. IMPRESSION: Interval removal of left nephrostomy catheter with recurrent left-sided hydronephrosis. Bilateral nonobstructing renal calculi are noted. Increased parenchymal density in the left likely representing acute on chronic infiltrate. Electronically Signed   By: Alcide Clever M.D.   On: 10/28/2023 19:12    Procedures Procedures    Medications Ordered in ED Medications - No data to display  ED Course/ Medical Decision Making/ A&P                                 Medical Decision Making Amount and/or Complexity of Data Reviewed Labs: ordered. Radiology: ordered.  Risk Decision regarding hospitalization.   Patient's family states he has had a longstanding the left nephrostomy tube because he has an atrophied right kidney and he had trouble with stones frequently.  Discussed with Dr. Pete Glatter on-call for urology.  Agrees with plan probably for hospitalist admission interventional radiology to replace the tube.  I placed a call discussed with interventional  radiology.  Patient's labs here white count 13.9 hemoglobin 10.4 platelets of 427 basic metabolic panel GFR 38 for creatinine 1.79 is actually somewhat baseline for him.  CT renal stone study done to take a look at things shows interval removal of the left nephrostomy catheter with recurrent left-sided hydronephrosis bilateral nonobstructing renal calculi is noted.  Urine  sent for culture from the suprapubic catheter.  Not planning on the straight address that with antibiotics the clinically does not seem to have infection.  I discussed with urology on-call Dr. Pete Glatter as mentioned above.  Also discussed with the backup interventional radiologist Dr. Dixon Boos.  Placed order for replacement of the nephrostomy tube by interventional radiology.  Will contact hospitalist for admission to either [facility] or [facility] can be done at either place according to interventional radiology.    Final Clinical Impression(s) / ED Diagnoses Final diagnoses:  Nephrostomy complication [facility])    Rx / DC Orders ED Discharge Orders     None         Vanetta Mulders, MD 10/28/23 2028    Vanetta Mulders, MD 10/28/23 2101

## 2023-10-28 NOTE — H&P (Signed)
[place]   PATIENT NAME: Jordan Ward    MR#:  [id number]  DATE OF BIRTH:  Feb 08, 1941  DATE OF ADMISSION:  10/28/2023  PRIMARY CARE PHYSICIAN: Benita Stabile, MD   Patient is coming from: Home  REQUESTING/REFERRING PHYSICIAN: Vanetta Mulders, MD  CHIEF COMPLAINT:   Chief Complaint  Patient presents with   nephrostpmy placement    HISTORY OF PRESENT ILLNESS:  Jordan Ward is a 83 y.o. male with medical history significant for coronary artery disease, dyslipidemia, hypertension, hypothyroidism, peripheral vascular disease and paroxysmal atrial fibrillation as well as PAD, who presented to the emergency room with acute onset of left nephrostomy tube displacement at his SNF.  This was apparently placed on 09/15/2023.  He has been getting it exchanged at [facility] by IR.  The patient could not apparently get to IR during the day and EMS brought him to the ER here.  No fever or chills.  No nausea or vomiting or abdominal pain.  The patient was having mild left flank pain.  No chest pain or palpitations.  No cough or wheezing or dyspnea.  He has a suprapubic catheter.  ED Course: When he came to the emergency room, vital signs were within normal.  Labs revealed leukocytosis of 13.9 with neutrophilia however coming down from 19.6 on 10/09/2023 with anemia at baseline and thrombocytosis of 427 compared to 207 then.  BMP was remarkable for mild hyponatremia, BUN of 22 with a creatinine 1.79 compared to 56/1.91 on 10/09/2023. EKG as reviewed by me : None Imaging: - CT renal stone study revealed interval removal of left nephrostomy catheter with recurrent left-sided hydronephrosis and bilateral nonobstructing renal calculi noted.  Contact was made with IR and the plan was to have the patient admitted to [facility] for IR intervention in AM. PAST MEDICAL HISTORY:   Past Medical History:  Diagnosis Date   Anemia    Arthritis    Back pain, chronic    Bilateral carotid bruits    C.  difficile colitis 09/2011   CAD (coronary artery disease)    Carotid artery stenosis    Cerebrovascular disease    Cerebrovascular disease 08/17/2018   Colon polyps    Complication of cystostomy catheter, initial encounter (HCC) 04/20/2020   Dyslipidemia    Dysphagia 10/07/2011   Encephalopathy    Gait disorder    HA (headache)    High grade dysplasia in colonic adenoma 09/2005   History of kidney stones    HTN (hypertension), malignant 10/06/2011   Hypernatremia    Hypokalemia    Hypothyroidism 10/08/2011   Insomnia    Junctional rhythm    Kidney stones    MS (multiple sclerosis) (HCC)    Nausea and vomiting 03/02/2020   Neuromuscular disorder (HCC)    MS   OSA (obstructive sleep apnea)    Paroxysmal atrial tachycardia (HCC)    Peripheral vascular disease (HCC)    Pneumonia 4 yrs ago   Pulmonary fibrosis (HCC) 10/06/2011   Pulmonary nodule 10/08/2011   PVD (peripheral vascular disease) (HCC)    Sacral ulcer (HCC)    Sleep apnea    cannot tolerate   Stroke (HCC)    left sided weakness   Suprapubic catheter (HCC)    TIA (transient ischemic attack)    Tremors of nervous system 10/08/2011   Urinary tract infection    Ventral hernia without obstruction or gangrene     Large 8X9cm ventral hernia with loss of  domain. CT reads report as diastasis recti with herniation or diastasis recti.  Dr. Henreitta Leber, Surgery, reviewed CT with radiology and there is herniation with only hernia sac or peritoneum over the bowel and large separation of the rectus muslce (i.e. diastasis recti aka loss of domain).  No surgical intervention recommended given size, age, and health.     PAST SURGICAL HISTORY:   Past Surgical History:  Procedure Laterality Date   APPENDECTOMY  09/2005   at time of left hemicolectomy   BACK SURGERY  1976/1979   lower   BILIARY DILATION N/A 03/03/2020   Procedure: BILIARY DILATION;  Surgeon: Malissa Hippo, MD;  [facility];  Service: Endoscopy;   Laterality: N/A;   CATARACT EXTRACTION W/PHACO Right 03/08/2018   Procedure: CATARACT EXTRACTION PHACO AND INTRAOCULAR LENS PLACEMENT RIGHT EYE;  Surgeon: Gemma Payor, MD;  Location: [facility];  Service: Ophthalmology;  Laterality: Right;  CDE: 8.86   CATARACT EXTRACTION W/PHACO Left 04/05/2018   Procedure: CATARACT EXTRACTION PHACO AND INTRAOCULAR LENS PLACEMENT (IOC);  Surgeon: Gemma Payor, MD;  Location: [facility];  Service: Ophthalmology;  Laterality: Left;  CDE: 7.36   CENTRAL LINE INSERTION-TUNNELED Right 09/11/2020   Procedure: PLACEMENT OF TUNNELED CENTRAL LINE INTO JUGULAR VEIN;  Surgeon: Lucretia Roers, MD;  Location: [facility];  Service: General;  Laterality: Right;   CHOLECYSTECTOMY     Dr. Katrinka Blazing   COLON SURGERY  09/2005   Fleishman: four tubular adenomas, large adenomatous polyp with HIGH GRADE dysplasia   COLONOSCOPY  11/2004   Dr. Bonnita Levan sessile polyp splenic flexure, 10mm sessile polyp desc colon, tubulovillous adenoma (bx not removed)   COLONOSCOPY  01/2005   poor prep, polyp could not be found   COLONOSCOPY  05/2005   with EMR, polypectomy Dr. Achilles Dunk, bx showed high grade dysplasia, partially resected   COLONOSCOPY  09/2005   Dr. Gwinda Passe, [place] ink tattooing, four villous colon polyp (3 had been missed on previous colonoscopies due to limitations of procedures   COLONOSCOPY  09/2006   normal TI, no polyps   COLONOSCOPY  10/2007   Dr. Frankey Poot distal mammillations, benign bx, normal TI, random bx neg for microscopic colitis   CYSTOSCOPY W/ URETERAL STENT PLACEMENT Left 09/05/2020   Procedure: CYSTOSCOPY WITH RETROGRADE PYELOGRAM/URETERAL STENT PLACEMENT;  Surgeon: Crist Fat, MD;  Location: [facility];  Service: Urology;  Laterality: Left;   CYSTOSCOPY WITH LITHOLAPAXY N/A 07/27/2018   Procedure: CYSTOSCOPY WITH LITHOLAPAXY VIA  SUPRAPUBIC TUBE;  Surgeon: Marcine Matar, MD;  Location: [facility];  Service: Urology;  Laterality: N/A;   CYSTOSCOPY WITH RETROGRADE  PYELOGRAM, URETEROSCOPY AND STENT PLACEMENT Left 06/09/2017   Procedure: CYSTOSCOPY WITH LEFT RETROGRADE PYELOGRAM, LEFT URETEROSCOPY, LEFT URETEROSCOPIC STONE EXTRACTION, LEFT URETERAL STENT PLACEMENT;  Surgeon: Marcine Matar, MD;  Location: [facility];  Service: Urology;  Laterality: Left;   CYSTOSCOPY/URETEROSCOPY/HOLMIUM LASER/STENT PLACEMENT Left 05/22/2020   Procedure: CYSTOSCOPY/URETEROSCOPY/STENT PLACEMENT;  Surgeon: Marcine Matar, MD;  [facility]: [facility];  Service: Urology;  Laterality: Left;   ERCP N/A 03/03/2020   Procedure: ENDOSCOPIC RETROGRADE CHOLANGIOPANCREATOGRAPHY (ERCP);  Surgeon: Malissa Hippo, MD;  [facility];  Service: Endoscopy;  Laterality: N/A;   ERCP N/A 04/20/2020   Procedure: ENDOSCOPIC RETROGRADE CHOLANGIOPANCREATOGRAPHY (ERCP);  Surgeon: Malissa Hippo, MD;  [facility];  Service: Endoscopy;  Laterality: N/A;  to be done at 7:30am in OR   GASTROINTESTINAL STENT REMOVAL N/A 04/20/2020   Procedure: STENT REMOVAL;  Surgeon: Malissa Hippo, MD;  [facility];  Service: Endoscopy;  Laterality: N/A;   INGUINAL HERNIA REPAIR  1971   bilateral   INSERTION OF SUPRAPUBIC CATHETER  06/09/2017   Procedure: EXCHANGE OF SUPRAPUBIC CATHETER;  Surgeon: Marcine Matar, MD;  Location: [facility];  Service: Urology;;   INSERTION OF SUPRAPUBIC CATHETER  05/22/2020   Procedure: SUPRAPUBIC CATHETER EXCHANGE;  Surgeon: Marcine Matar, MD;  Location: [facility];  Service: Urology;;   IR NEPHROSTOMY EXCHANGE LEFT  03/25/2021   IR NEPHROSTOMY EXCHANGE LEFT  05/20/2021   IR NEPHROSTOMY EXCHANGE LEFT  07/13/2021   IR NEPHROSTOMY EXCHANGE LEFT  09/09/2021   IR NEPHROSTOMY EXCHANGE LEFT  11/04/2021   IR NEPHROSTOMY EXCHANGE LEFT  12/23/2021   IR NEPHROSTOMY EXCHANGE LEFT  02/17/2022   IR NEPHROSTOMY EXCHANGE LEFT  04/14/2022   IR NEPHROSTOMY EXCHANGE LEFT  06/09/2022   IR NEPHROSTOMY EXCHANGE LEFT  07/28/2022   IR NEPHROSTOMY EXCHANGE LEFT  09/09/2022   IR  NEPHROSTOMY EXCHANGE LEFT  11/04/2022   IR NEPHROSTOMY EXCHANGE LEFT  12/30/2022   IR NEPHROSTOMY EXCHANGE LEFT  02/25/2023   IR NEPHROSTOMY EXCHANGE LEFT  03/31/2023   IR NEPHROSTOMY EXCHANGE LEFT  05/26/2023   IR NEPHROSTOMY EXCHANGE LEFT  07/21/2023   IR NEPHROSTOMY EXCHANGE LEFT  09/15/2023   IR NEPHROSTOMY PLACEMENT LEFT  05/26/2017   IR NEPHROSTOMY PLACEMENT LEFT  05/02/2020   IR NEPHROSTOMY PLACEMENT LEFT  02/15/2021   IR URETERAL STENT PERC REMOVAL MOD SED  03/25/2021   KIDNEY STONE SURGERY  09/13/2015   LITHOTRIPSY N/A 03/03/2020   Procedure: MECHANICAL LITHOTRIPSY WITH REMOVAL OF MULTIPLE STONE FRAGMENTS;  Surgeon: Malissa Hippo, MD;  [facility];  Service: Endoscopy;  Laterality: N/A;   NEPHROLITHOTOMY Left 09/13/2015   Procedure: LEFT PERCUTANEOUS NEPHROLITHOTOMY ;  Surgeon: Marcine Matar, MD;  Location: [facility];  Service: Urology;  Laterality: Left;   NEPHROSTOMY TUBE REMOVAL  05/22/2020   Procedure: NEPHROSTOMY TUBE REMOVAL;  Surgeon: Marcine Matar, MD;  Location: [facility];  Service: Urology;;   [facility] REMOVAL Right 09/20/2020   Procedure: MINOR REMOVAL CENTRAL LINE;  Surgeon: Lucretia Roers, MD;  Location: [facility];  Service: General;  Laterality: Right;  Pt to arrive at 7:30am for procedure   REMOVAL OF STONES N/A 04/20/2020   Procedure: REMOVAL OF STONES;  Surgeon: Malissa Hippo, MD;  [facility];  Service: Endoscopy;  Laterality: N/A;   SPHINCTEROTOMY  03/03/2020   Procedure: BILLARY SPHINCTEROTOMY;  Surgeon: Malissa Hippo, MD;  [facility];  Service: Endoscopy;;   SUPRAPUBIC CATHETER INSERTION      SOCIAL HISTORY:   Social History   Tobacco Use   Smoking status: Former    Current packs/day: 0.00    Average packs/day: 1 pack/day for 25.0 years (25.0 ttl pk-yrs)    Types: Cigarettes    Start date: 03/04/1963    Quit date: 03/03/1988    Years since quitting: 35.6   Smokeless tobacco: Never  Substance Use Topics   Alcohol use:  No    FAMILY HISTORY:   Family History  Problem Relation Age of Onset   Cirrhosis Brother        etoh   Stroke Mother 57   Coronary artery disease Father 108   Heart attack Brother    Cancer Sister    Multiple sclerosis Other    Colon cancer Neg Hx     DRUG ALLERGIES:   Allergies  Allergen Reactions   Tetracyclines & Related Anaphylaxis and Rash  Cipro [Ciprofloxacin Hcl] Other (See Comments)    "Larey Seat out in the floor"    REVIEW OF SYSTEMS:   ROS As per history of present illness. All pertinent systems were reviewed above. Constitutional, HEENT, cardiovascular, respiratory, GI, GU, musculoskeletal, neuro, psychiatric, endocrine, integumentary and hematologic systems were reviewed and are otherwise negative/unremarkable except for positive findings mentioned above in the HPI.   MEDICATIONS AT HOME:   Prior to Admission medications   Medication Sig Start Date End Date Taking? Authorizing Provider  acetaminophen (TYLENOL) 325 MG tablet Take 650 mg by mouth every 6 (six) hours as needed for fever.    [provider]  amLODipine (NORVASC) 10 MG tablet Take 5 mg by mouth every evening. After supper. 05/21/21   [provider]  aspirin EC 81 MG tablet Take 1 tablet (81 mg total) by mouth daily. Swallow whole. 10/12/23 10/11/24  Sherryll Burger, Pratik D, DO  benzonatate (TESSALON) 100 MG capsule Take 1 capsule (100 mg total) by mouth 3 (three) times daily as needed for cough. 10/12/23   Sherryll Burger, Pratik D, DO  diazepam (VALIUM) 5 MG tablet Take 1 tablet (5 mg total) by mouth 2 (two) times daily. 10/12/23   Sherryll Burger, Pratik D, DO  HYDROcodone-acetaminophen (NORCO) 7.5-325 MG tablet Take 1 tablet by mouth 2 (two) times daily. Max APAP 3 GM IN 24 HOURS FROM ALL SOURCES 10/12/23   Sherryll Burger, Pratik D, DO  Ipratropium-Albuterol (COMBIVENT RESPIMAT) 20-100 MCG/ACT AERS respimat Inhale 1 puff into the lungs every 6 (six) hours as needed. 10/12/23   Sherryll Burger, Pratik D, DO  levothyroxine (SYNTHROID) 75 MCG  tablet Take 75 mcg by mouth in the morning. 07/02/21   [provider]  loperamide (IMODIUM) 2 MG capsule Take 1 capsule (2 mg total) by mouth 4 (four) times daily as needed for diarrhea or loose stools. 10/12/23   Sherryll Burger, Pratik D, DO  mirabegron ER (MYRBETRIQ) 50 MG TB24 tablet Take 1 tablet (50 mg total) by mouth daily. 10/12/23   Sherryll Burger, Pratik D, DO  OXYGEN Inhale 1 L into the lungs every evening.    [provider]  pantoprazole (PROTONIX) 40 MG tablet Take 1 tablet (40 mg total) by mouth daily. 10/12/23   Sherryll Burger, Pratik D, DO  polyvinyl alcohol (LIQUIFILM TEARS) 1.4 % ophthalmic solution Place 1 drop into both eyes 4 (four) times daily as needed for dry eyes. 10/12/23   Sherryll Burger, Pratik D, DO  potassium chloride SA (KLOR-CON) 20 MEQ tablet Take 20 mEq by mouth every evening. After supper 07/02/21   [provider]  thiamine (VITAMIN B-1) 100 MG tablet Take 1 tablet (100 mg total) by mouth daily. 10/12/23   Sherryll Burger, Pratik D, DO      VITAL SIGNS:  Blood pressure 128/78, pulse 86, temperature 98.7 F (37.1 C), temperature source Oral, resp. rate 18, height 5\' 5"  (1.651 m), weight 49.9 kg, SpO2 95%.  PHYSICAL EXAMINATION:  Physical Exam  GENERAL:  83 y.o.-year-old Caucasian male patient lying in the bed with no acute distress.  EYES: Pupils equal, round, reactive to light and accommodation. No scleral icterus. Extraocular muscles intact.  HEENT: Head atraumatic, normocephalic. Oropharynx and nasopharynx clear.  NECK:  Supple, no jugular venous distention. No thyroid enlargement, no tenderness.  LUNGS: Normal breath sounds bilaterally, no wheezing, rales,rhonchi or crepitation. No use of accessory muscles of respiration.  CARDIOVASCULAR: Regular rate and rhythm, S1, S2 normal. No murmurs, rubs, or gallops.  ABDOMEN: Soft, nondistended, nontender. Bowel sounds present. No organomegaly or mass.  Left nephrostomy wound with clean surrounding skin with no signs of  infection. EXTREMITIES: No pedal edema, cyanosis, or clubbing.  NEUROLOGIC: Cranial nerves II through XII are intact. Muscle strength 5/5 in all extremities. Sensation intact. Gait not checked.  PSYCHIATRIC: The patient is alert and oriented x 3.  Normal affect and good eye contact. SKIN: No obvious rash, lesion, or ulcer.   LABORATORY PANEL:   CBC Recent Labs  Lab 10/28/23 1813  WBC 13.9*  HGB 10.4*  HCT 33.1*  PLT 427*   ------------------------------------------------------------------------------------------------------------------  Chemistries  Recent Labs  Lab 10/28/23 1813  NA 134*  K 3.8  CL 100  CO2 23  GLUCOSE 116*  BUN 22  CREATININE 1.79*  CALCIUM 8.7*   ------------------------------------------------------------------------------------------------------------------  Cardiac Enzymes No results for input(s): "TROPONINI" in the last 168 hours. ------------------------------------------------------------------------------------------------------------------  RADIOLOGY:  CT Renal Stone Study Result Date: 10/28/2023 CLINICAL DATA:  Flank pain, left nephrostomy catheter has been recently removed EXAM: CT ABDOMEN AND PELVIS WITHOUT CONTRAST TECHNIQUE: Multidetector CT imaging of the abdomen and pelvis was performed following the standard protocol without IV contrast. RADIATION DOSE REDUCTION: This exam was performed according to the departmental dose-optimization program which includes automated exposure control, adjustment of the mA and/or kV according to patient size and/or use of iterative reconstruction technique. COMPARISON:  07/01/2023 FINDINGS: Lower chest: Lung bases demonstrate diffuse fibrotic change similar to that noted on prior exam. More marked consolidation in the left lower lobe with bronchiectasis is seen. Hepatobiliary: Gallbladder has been surgically removed. Pneumobilia is noted increased from the prior exam consistent with prior intervention. No  hepatic mass is noted. Pancreas: Unremarkable. No pancreatic ductal dilatation or surrounding inflammatory changes. Spleen: Normal in size without focal abnormality. Adrenals/Urinary Tract: Adrenal glands are within normal limits. The right kidney is shrunken stable in appearance from the prior exam. Stable nonobstructing stone measuring 6 mm is noted on the right. The left kidney demonstrates multiple cysts as well as hydronephrosis. A renal pelvic stone measuring up to 10 mm is seen. Previously noted ostomy catheter is no longer identified. More distal left ureter is within normal limits. The bladder is decompressed by a suprapubic Foley. Stomach/Bowel: Scattered fecal material is noted throughout the colon. Postsurgical changes in the sigmoid colon consistent with left colectomy are noted. The appendix is not well visualized. This is consistent with the given clinical history. Small bowel is within normal limits. Stomach is decompressed. Vascular/Lymphatic: Aortic atherosclerosis. No enlarged abdominal or pelvic lymph nodes. Reproductive: Prostate is unremarkable. Other: Diastasis of the rectus muscles is noted anteriorly similar to that seen on the prior exam. Soft tissue density is noted along the course of the distal right inguinal canal. This likely represents the right testicle. Musculoskeletal: Degenerative changes of lumbar spine are noted. IMPRESSION: Interval removal of left nephrostomy catheter with recurrent left-sided hydronephrosis. Bilateral nonobstructing renal calculi are noted. Increased parenchymal density in the left likely representing acute on chronic infiltrate. Electronically Signed   By: Alcide Clever M.D.   On: 10/28/2023 19:12      IMPRESSION AND PLAN:  Assessment and Plan: * Displacement of nephrostomy tube [facility]) - The patient will be admitted to an observation bed at [facility]. - IR consult was ordered. - IR was contacted earlier and is aware about the patient.  They will need to  be notified in AM. - The patient will be kept n.p.o. after midnight.  Essential hypertension - We will continue antihypertensive therapy.  Dyslipidemia - We will continue statin therapy.  Anxiety - We will continue Valium     DVT prophylaxis: Lovenox.  Advanced Care Planning:  Code Status: full code.  Family Communication:  The plan of care was discussed in details with the patient (and family). I answered all questions. The patient agreed to proceed with the above mentioned plan. Further management will depend upon hospital course. Disposition Plan: Back to previous home environment Consults called: IR All the records are reviewed and case discussed with ED provider.  Status is: Observation   I certify that at the time of admission, it is my clinical judgment that the patient will require hospital care extending LESS than 2 midnights.                            Dispo: The patient is from: Home              Anticipated d/c is to: Home              Patient currently is not medically stable to d/c.              Difficult to place patient: No  Hannah Beat M.D on 10/29/2023 at 3:24 AM  [facility]   From 7 PM-7 AM, contact night-coverage [email or web address]  CC: Primary care physician; Benita Stabile, MD

## 2023-10-29 ENCOUNTER — Ambulatory Visit (HOSPITAL_COMMUNITY): Payer: Medicare Other

## 2023-10-29 DIAGNOSIS — T83022A Displacement of nephrostomy catheter, initial encounter: Secondary | ICD-10-CM | POA: Diagnosis not present

## 2023-10-29 DIAGNOSIS — Z436 Encounter for attention to other artificial openings of urinary tract: Secondary | ICD-10-CM | POA: Insufficient documentation

## 2023-10-29 DIAGNOSIS — R109 Unspecified abdominal pain: Secondary | ICD-10-CM | POA: Diagnosis not present

## 2023-10-29 DIAGNOSIS — F419 Anxiety disorder, unspecified: Secondary | ICD-10-CM | POA: Insufficient documentation

## 2023-10-29 DIAGNOSIS — E785 Hyperlipidemia, unspecified: Secondary | ICD-10-CM

## 2023-10-29 HISTORY — PX: IR NEPHROSTOMY TUBE CHANGE: IMG1442

## 2023-10-29 LAB — CBC
HCT: 32 % — ABNORMAL LOW (ref 39.0–52.0)
Hemoglobin: 10 g/dL — ABNORMAL LOW (ref 13.0–17.0)
MCH: 28.8 pg (ref 26.0–34.0)
MCHC: 31.3 g/dL (ref 30.0–36.0)
MCV: 92.2 fL (ref 80.0–100.0)
Platelets: 367 10*3/uL (ref 150–400)
RBC: 3.47 MIL/uL — ABNORMAL LOW (ref 4.22–5.81)
RDW: 14.7 % (ref 11.5–15.5)
WBC: 15.5 10*3/uL — ABNORMAL HIGH (ref 4.0–10.5)
nRBC: 0 % (ref 0.0–0.2)

## 2023-10-29 LAB — BASIC METABOLIC PANEL
Anion gap: 12 (ref 5–15)
BUN: 21 mg/dL (ref 8–23)
CO2: 22 mmol/L (ref 22–32)
Calcium: 8.5 mg/dL — ABNORMAL LOW (ref 8.9–10.3)
Chloride: 100 mmol/L (ref 98–111)
Creatinine, Ser: 1.73 mg/dL — ABNORMAL HIGH (ref 0.61–1.24)
GFR, Estimated: 39 mL/min — ABNORMAL LOW (ref >60)
Glucose, Bld: 112 mg/dL — ABNORMAL HIGH (ref 70–99)
Potassium: 3.8 mmol/L (ref 3.5–5.1)
Sodium: 134 mmol/L — ABNORMAL LOW (ref 135–145)

## 2023-10-29 MED ORDER — SODIUM CHLORIDE 0.9 % IV SOLN
1.0000 g | INTRAVENOUS | Status: DC
  Administered 2023-10-29: 1 g via INTRAVENOUS
  Filled 2023-10-29: qty 10

## 2023-10-29 MED ORDER — IOHEXOL 300 MG/ML  SOLN
50.0000 mL | Freq: Once | INTRAMUSCULAR | Status: AC | PRN
  Administered 2023-10-29: 15 mL

## 2023-10-29 MED ORDER — HYDROCODONE-ACETAMINOPHEN 7.5-325 MG PO TABS: 1.0000 | ORAL_TABLET | Freq: Two times a day (BID) | ORAL | 0 refills | Status: DC

## 2023-10-29 MED ORDER — DIAZEPAM 5 MG PO TABS: 5.0000 mg | ORAL_TABLET | Freq: Two times a day (BID) | ORAL | 0 refills | Status: DC

## 2023-10-29 MED ORDER — LIDOCAINE HCL 1 % IJ SOLN
20.0000 mL | Freq: Once | INTRAMUSCULAR | Status: AC
  Administered 2023-10-29: 10 mL via INTRADERMAL
  Filled 2023-10-29: qty 20

## 2023-10-29 MED ORDER — CEFDINIR 300 MG PO CAPS: 300.0000 mg | ORAL_CAPSULE | Freq: Every day | ORAL | 0 refills | Status: AC

## 2023-10-29 NOTE — ED Notes (Signed)
Patient with minimal sleep through night.  Attempted to wake for vitals and patient drowsy.  Will defer medication administration until patient more alert.

## 2023-10-29 NOTE — ED Notes (Signed)
Pt spouse requests update when [facility] transfers patient.

## 2023-10-29 NOTE — Discharge Summary (Addendum)
Physician Discharge Summary  Jordan Ward ZOX:[id number] DOB: [birth year] DOA: 10/28/2023  PCP: Benita Stabile, MD  Admit date: 10/28/2023 Discharge date: 10/29/2023  Disposition: [facility]   Recommendations for Outpatient follow up Please check CBC in 3 days to follow up Hg and WBC   Discharge Condition: STABLE   CODE STATUS: FULL DIET: resume previous diet   Brief Hospitalization Summary: Please see all hospital notes, images, labs for full details of the hospitalization. Admission provider HPI: 83 y.o. male with medical history significant for coronary artery disease, dyslipidemia, hypertension, hypothyroidism, peripheral vascular disease and paroxysmal atrial fibrillation as well as PAD, who presented to the emergency room with acute onset of left nephrostomy tube displacement at his SNF.  This was apparently placed on 09/15/2023.  He has been getting it exchanged at [facility] by IR.  The patient could not apparently get to IR during the day and EMS brought him to the ER here.  No fever or chills.  No nausea or vomiting or abdominal pain.  The patient was having mild left flank pain.  No chest pain or palpitations.  No cough or wheezing or dyspnea.  He has a suprapubic catheter.   ED Course: When he came to the emergency room, vital signs were within normal.  Labs revealed leukocytosis of 13.9 with neutrophilia however coming down from 19.6 on 10/09/2023 with anemia at baseline and thrombocytosis of 427 compared to 207 then.  BMP was remarkable for mild hyponatremia, BUN of 22 with a creatinine 1.79 compared to 56/1.91 on 10/09/2023. EKG as reviewed by me : None Imaging: - CT renal stone study revealed interval removal of left nephrostomy catheter with recurrent left-sided hydronephrosis and bilateral nonobstructing renal calculi noted.   Contact was made with IR and the plan was to transfer patient to [facility] for [facility]N [facility] and return to [facility] after the procedure completed.  Pt was placed in observation for  solely to have procedure done.    Pt was sent to [facility] early this morning and had successful IR guided image rescue of displaced left PCN and new 44F pigtail drain placed.  Pt returned to [facility] from procedure in stable condition and started back on a diet.  He has done well and seems to have tolerated the procedure well.  He has had this done several times before.  He was given IV ceftriaxone earlier today and he will discharge on cefdinir to start 1/31 to complete 4 more daily doses.  He is to follow up as already arranged. No changes to his regular medications made.    Discharge Diagnoses:  Principal Problem:   Displacement of nephrostomy tube [facility]) Active Problems:   Essential hypertension   Dyslipidemia   Anxiety   Flank pain  Discharge Instructions:  Allergies as of 10/29/2023       Reactions   Tetracyclines & Related Anaphylaxis, Rash   Cipro [ciprofloxacin Hcl] Other (See Comments)   "Larey Seat out in the floor"   Ciprofloxacin    Trouble swallowing according to wife Other Reaction(s): Other (See Comments) "Larey Seat out in the floor"        Medication List     STOP taking these medications    benzonatate 100 MG capsule Commonly known as: TESSALON   Combivent Respimat 20-100 MCG/ACT Aers respimat Generic drug: Ipratropium-Albuterol   loperamide 2 MG capsule Commonly known as: IMODIUM   polyvinyl alcohol 1.4 % ophthalmic solution Commonly known as: LIQUIFILM TEARS       TAKE these  medications    acetaminophen 325 MG tablet Commonly known as: TYLENOL Take 650 mg by mouth every 6 (six) hours as needed for fever, mild pain (pain score 1-3) or moderate pain (pain score 4-6).   amLODipine 10 MG tablet Commonly known as: NORVASC Take 5 mg by mouth daily.   ascorbic acid 500 MG tablet Commonly known as: VITAMIN C Take 500 mg by mouth daily.   aspirin 81 MG chewable tablet Chew 81 mg by mouth daily.   cefdinir 300 MG capsule Commonly known as: OMNICEF Take 1 capsule  (300 mg total) by mouth daily for 4 days. Start taking on: October 30, 2023   diazepam 5 MG tablet Commonly known as: VALIUM Take 1 tablet (5 mg total) by mouth 2 (two) times daily.   HYDROcodone-acetaminophen 7.5-325 MG tablet Commonly known as: NORCO Take 1 tablet by mouth 2 (two) times daily. Max APAP 3 GM IN 24 HOURS FROM ALL SOURCES   levothyroxine 75 MCG tablet Commonly known as: SYNTHROID Take 75 mcg by mouth in the morning.   mirabegron ER 50 MG Tb24 tablet Commonly known as: MYRBETRIQ Take 1 tablet (50 mg total) by mouth daily.   OXYGEN Inhale 1 L into the lungs every evening.   pantoprazole 40 MG tablet Commonly known as: PROTONIX Take 1 tablet (40 mg total) by mouth daily.   potassium chloride SA 20 MEQ tablet Commonly known as: KLOR-CON M Take 20 mEq by mouth daily.   thiamine 100 MG tablet Commonly known as: Vitamin B-1 Take 1 tablet (100 mg total) by mouth daily.   zinc gluconate 50 MG tablet Take 50 mg by mouth daily.        Allergies  Allergen Reactions   Tetracyclines & Related Anaphylaxis and Rash   Cipro [Ciprofloxacin Hcl] Other (See Comments)    "Larey Seat out in the floor"   Ciprofloxacin     Trouble swallowing according to wife  Other Reaction(s): Other (See Comments)  "Larey Seat out in the floor"   Allergies as of 10/29/2023       Reactions   Tetracyclines & Related Anaphylaxis, Rash   Cipro [ciprofloxacin Hcl] Other (See Comments)   "Larey Seat out in the floor"   Ciprofloxacin    Trouble swallowing according to wife Other Reaction(s): Other (See Comments) "Larey Seat out in the floor"        Medication List     STOP taking these medications    benzonatate 100 MG capsule Commonly known as: TESSALON   Combivent Respimat 20-100 MCG/ACT Aers respimat Generic drug: Ipratropium-Albuterol   loperamide 2 MG capsule Commonly known as: IMODIUM   polyvinyl alcohol 1.4 % ophthalmic solution Commonly known as: LIQUIFILM TEARS       TAKE  these medications    acetaminophen 325 MG tablet Commonly known as: TYLENOL Take 650 mg by mouth every 6 (six) hours as needed for fever, mild pain (pain score 1-3) or moderate pain (pain score 4-6).   amLODipine 10 MG tablet Commonly known as: NORVASC Take 5 mg by mouth daily.   ascorbic acid 500 MG tablet Commonly known as: VITAMIN C Take 500 mg by mouth daily.   aspirin 81 MG chewable tablet Chew 81 mg by mouth daily.   cefdinir 300 MG capsule Commonly known as: OMNICEF Take 1 capsule (300 mg total) by mouth daily for 4 days. Start taking on: October 30, 2023   diazepam 5 MG tablet Commonly known as: VALIUM Take 1 tablet (5 mg total) by mouth 2 (  two) times daily.   HYDROcodone-acetaminophen 7.5-325 MG tablet Commonly known as: NORCO Take 1 tablet by mouth 2 (two) times daily. Max APAP 3 GM IN 24 HOURS FROM ALL SOURCES   levothyroxine 75 MCG tablet Commonly known as: SYNTHROID Take 75 mcg by mouth in the morning.   mirabegron ER 50 MG Tb24 tablet Commonly known as: MYRBETRIQ Take 1 tablet (50 mg total) by mouth daily.   OXYGEN Inhale 1 L into the lungs every evening.   pantoprazole 40 MG tablet Commonly known as: PROTONIX Take 1 tablet (40 mg total) by mouth daily.   potassium chloride SA 20 MEQ tablet Commonly known as: KLOR-CON M Take 20 mEq by mouth daily.   thiamine 100 MG tablet Commonly known as: Vitamin B-1 Take 1 tablet (100 mg total) by mouth daily.   zinc gluconate 50 MG tablet Take 50 mg by mouth daily.        Procedures/Studies: CT Renal Stone Study Result Date: 10/28/2023 CLINICAL DATA:  Flank pain, left nephrostomy catheter has been recently removed EXAM: CT ABDOMEN AND PELVIS WITHOUT CONTRAST TECHNIQUE: Multidetector CT imaging of the abdomen and pelvis was performed following the standard protocol without IV contrast. RADIATION DOSE REDUCTION: This exam was performed according to the departmental dose-optimization program which includes  automated exposure control, adjustment of the mA and/or kV according to patient size and/or use of iterative reconstruction technique. COMPARISON:  07/01/2023 FINDINGS: Lower chest: Lung bases demonstrate diffuse fibrotic change similar to that noted on prior exam. More marked consolidation in the left lower lobe with bronchiectasis is seen. Hepatobiliary: Gallbladder has been surgically removed. Pneumobilia is noted increased from the prior exam consistent with prior intervention. No hepatic mass is noted. Pancreas: Unremarkable. No pancreatic ductal dilatation or surrounding inflammatory changes. Spleen: Normal in size without focal abnormality. Adrenals/Urinary Tract: Adrenal glands are within normal limits. The right kidney is shrunken stable in appearance from the prior exam. Stable nonobstructing stone measuring 6 mm is noted on the right. The left kidney demonstrates multiple cysts as well as hydronephrosis. A renal pelvic stone measuring up to 10 mm is seen. Previously noted ostomy catheter is no longer identified. More distal left ureter is within normal limits. The bladder is decompressed by a suprapubic Foley. Stomach/Bowel: Scattered fecal material is noted throughout the colon. Postsurgical changes in the sigmoid colon consistent with left colectomy are noted. The appendix is not well visualized. This is consistent with the given clinical history. Small bowel is within normal limits. Stomach is decompressed. Vascular/Lymphatic: Aortic atherosclerosis. No enlarged abdominal or pelvic lymph nodes. Reproductive: Prostate is unremarkable. Other: Diastasis of the rectus muscles is noted anteriorly similar to that seen on the prior exam. Soft tissue density is noted along the course of the distal right inguinal canal. This likely represents the right testicle. Musculoskeletal: Degenerative changes of lumbar spine are noted. IMPRESSION: Interval removal of left nephrostomy catheter with recurrent left-sided  hydronephrosis. Bilateral nonobstructing renal calculi are noted. Increased parenchymal density in the left likely representing acute on chronic infiltrate. Electronically Signed   By: Alcide Clever M.D.   On: 10/28/2023 19:12   ECHOCARDIOGRAM COMPLETE Result Date: 09/30/2023    ECHOCARDIOGRAM REPORT   Patient Name:   Jordan Ward Voght Date of Exam: 09/30/2023 Medical Rec #:  [id number]    Height:       65.0 in Accession #:    [id number]   Weight:       112.0 lb Date of Birth:  April 13, 1941  BSA:          1.546 m Patient Age:    82 years     BP:           96/62 mmHg Patient Gender: M            HR:           67 bpm. [facility] Procedure: 2D Echo, Cardiac Doppler and Color Doppler Indications:    Stroke I63.9  History:        Patient has prior history of Echocardiogram examinations, most                 recent 11/08/2019. CHF and Cardiomyopathy, CAD, TIA, Stroke and                 CKD, stage 4, Signs/Symptoms:Hypotension; Risk                 Factors:Hypertension, Dyslipidemia and Sleep Apnea.  Sonographer:    Lucendia Herrlich RCS Referring Phys: [phone] MICHAEL E NORINS IMPRESSIONS  1. Left ventricular ejection fraction, by estimation, is 30 to 35%. The left ventricle has moderately decreased function. The left ventricle demonstrates global hypokinesis. Left ventricular diastolic parameters are consistent with Grade I diastolic dysfunction (impaired relaxation).  2. Right ventricular systolic function is normal. The right ventricular size is normal.  3. The mitral valve is abnormal. Mild mitral valve regurgitation. No evidence of mitral stenosis.  4. The tricuspid valve is abnormal.  5. Moderate aortic stenosis. AVA VTI 1.17, mean gradient 5, DI 0.35, SVI 25. Lower than expected gradient due to low flow low gradient aortic stenosis. . The aortic valve was not well visualized. There is moderate calcification of the aortic valve. There is moderate thickening of the aortic valve. Aortic valve regurgitation is not  visualized. Moderate aortic valve stenosis.  6. The inferior vena cava is normal in size with greater than 50% respiratory variability, suggesting right atrial pressure of 3 mmHg. FINDINGS  Left Ventricle: Left ventricular ejection fraction, by estimation, is 30 to 35%. The left ventricle has moderately decreased function. The left ventricle demonstrates global hypokinesis. The left ventricular internal cavity size was normal in size. There is no left ventricular hypertrophy. Left ventricular diastolic parameters are consistent with Grade I diastolic dysfunction (impaired relaxation). Normal left ventricular filling pressure. Right Ventricle: The right ventricular size is normal. Right vetricular Mamula thickness was not well visualized. Right ventricular systolic function is normal. Left Atrium: Left atrial size was normal in size. Right Atrium: Right atrial size was normal in size. Pericardium: There is no evidence of pericardial effusion. Mitral Valve: The mitral valve is abnormal. Mild mitral valve regurgitation. No evidence of mitral valve stenosis. Tricuspid Valve: The tricuspid valve is abnormal. Tricuspid valve regurgitation is mild . No evidence of tricuspid stenosis. Aortic Valve: Moderate aortic stenosis. AVA VTI 1.17, mean gradient 5, DI 0.35, SVI 25. Lower than expected gradient due to low flow low gradient aortic stenosis. The aortic valve was not well visualized. There is moderate calcification of the aortic valve. There is moderate thickening of the aortic valve. There is moderate aortic valve annular calcification. Aortic valve regurgitation is not visualized. Moderate aortic stenosis is present. Aortic valve mean gradient measures 4.8 mmHg. Aortic valve peak gradient measures 7.5 mmHg. Aortic valve area, by VTI measures 1.17 cm. Pulmonic Valve: The pulmonic valve was not well visualized. Pulmonic valve regurgitation is not visualized. No evidence of pulmonic stenosis. Aorta: The aortic root and  ascending aorta are structurally normal, with no evidence of dilitation. Venous: The inferior vena cava is normal in size with greater than 50% respiratory variability, suggesting right atrial pressure of 3 mmHg. IAS/Shunts: No atrial level shunt detected by color flow Doppler.  LEFT VENTRICLE PLAX 2D LVIDd:         4.70 cm      Diastology LVIDs:         4.10 cm      LV e' medial:    3.68 cm/s LV PW:         0.80 cm      LV E/e' medial:  14.7 LV IVS:        0.80 cm      LV e' lateral:   6.22 cm/s LVOT diam:     2.10 cm      LV E/e' lateral: 8.7 LV SV:         39 LV SV Index:   25 LVOT Area:     3.46 cm  LV Volumes (MOD) LV vol d, MOD A2C: 154.0 ml LV vol d, MOD A4C: 81.6 ml LV vol s, MOD A2C: 109.0 ml LV vol s, MOD A4C: 58.9 ml LV SV MOD A2C:     45.0 ml LV SV MOD A4C:     81.6 ml LV SV MOD BP:      37.8 ml RIGHT VENTRICLE             IVC RV S prime:     10.60 cm/s  IVC diam: 1.60 cm TAPSE (M-mode): 2.5 cm LEFT ATRIUM             Index        RIGHT ATRIUM           Index LA diam:        3.50 cm 2.26 cm/m   RA Area:     11.20 cm LA Vol (A2C):   38.2 ml 24.71 ml/m  RA Volume:   27.50 ml  17.79 ml/m LA Vol (A4C):   37.9 ml 24.51 ml/m LA Biplane Vol: 39.2 ml 25.35 ml/m  AORTIC VALVE AV Area (Vmax):    1.28 cm AV Area (Vmean):   1.11 cm AV Area (VTI):     1.17 cm AV Vmax:           136.70 cm/s AV Vmean:          100.732 cm/s AV VTI:            0.333 m AV Peak Grad:      7.5 mmHg AV Mean Grad:      4.8 mmHg LVOT Vmax:         50.40 cm/s LVOT Vmean:        32.200 cm/s LVOT VTI:          0.113 m LVOT/AV VTI ratio: 0.34  AORTA Ao Root diam: 2.60 cm Ao Asc diam:  3.40 cm MITRAL VALVE               TRICUSPID VALVE MV Area (PHT): 3.12 cm    TR Peak grad:   26.8 mmHg MV Decel Time: 243 msec    TR Vmax:        259.00 cm/s MV E velocity: 54.20 cm/s MV A velocity: 85.90 cm/s  SHUNTS MV E/A ratio:  0.63        Systemic VTI:  0.11 m  Systemic Diam: 2.10 cm Dina Rich MD Electronically signed  by Dina Rich MD Signature Date/Time: 09/30/2023/1:11:09 PM    Final    MR BRAIN WO CONTRAST Result Date: 09/29/2023 CLINICAL DATA:  Provided history: Stroke, follow-up. Additional history obtained from electronic MEDICAL RECORD NUMBERHistory of multiple sclerosis. EXAM: MRI HEAD WITHOUT CONTRAST MRA HEAD WITHOUT CONTRAST TECHNIQUE: Multiplanar, multi-echo pulse sequences of the brain and surrounding structures were acquired without intravenous contrast. Angiographic images of the Circle of Willis were acquired using MRA technique without intravenous contrast. COMPARISON:  Head CT 08/24/2022. Brain MRI 10/03/2011. Report from MRA head 09/30/2001 (images unavailable). FINDINGS: MRI HEAD FINDINGS Brain: Mild-to-moderate cerebral atrophy. Punctate acute infarct within the right occipital lobe near the gray-white junction (PCA vascular territory) (series 5, image 21). Punctate acute infarct within the left occipital lobe white matter (PCA territory) (series 5, image 18). 14 mm acute infarct within the left cerebellar hemisphere (series 5, image 7). Multiple chronic lacunar infarcts again noted within the bilateral cerebral hemispheric white matter and within/about the bilateral deep gray nuclei. Chronic hemosiderin deposition associated with some of these infarcts. Background advanced patchy and confluent T2 FLAIR hyperintense signal abnormality within the cerebral white matter, likely reflecting a combination of chronic small ischemic disease and demyelinating disease given patient's age and history of multiple sclerosis. No cortical encephalomalacia is identified. No evidence of an intracranial mass. No extra-axial fluid collection. No midline shift. Vascular: Reported below under the MRA section. Skull and upper cervical spine: No focal worrisome marrow lesion. Incompletely assessed cervical spondylosis. Sinuses/Orbits: No mass or acute finding within the imaged orbits. Prior bilateral ocular lens replacement.  Minimal mucosal thickening within the bilateral ethmoid and right maxillary sinuses. MRA HEAD FINDINGS Mildly motion degraded examination. Within this limitation, findings are as follows. Anterior circulation: The intracranial internal carotid arteries are patent. Atherosclerotic irregularity of both vessels. Moderate/severe focal stenosis within the cavernous left ICA. No more than mild stenosis of the intracranial internal carotid arteries elsewhere. The M1 middle cerebral arteries are patent. Atherosclerotic irregularity of the M2 and more distal MCA vessels, bilaterally. No M2 proximal branch occlusion or high-grade proximal M2 stenosis is identified. The anterior cerebral arteries are patent. Moderate stenosis within the left anterior cerebral artery proximal A2 segment. 3 mm focus of flow-related signal along the medial aspect of the cavernous left internal carotid artery suspicious for an aneurysm (for instance as seen on series 12, image 70). 3 mm inferomedially projecting vascular protrusion arising from the cavernous left internal carotid artery more distally, also suspicious for an aneurysm (for instance as seen on series 1021, image 13). Posterior circulation: Sites of occlusion within the V4 right vertebral artery, by report new from the prior MRA head of 09/30/2001. Flow related signal is present within the proximal right PICA. The visualized intracranial left vertebral artery is patent without stenosis. The basilar artery is patent. The posterior cerebral arteries are patent. Atherosclerotic irregularity of both vessels. Most notably, there is a severe stenosis within a left PCA branch at the P3/P4 junction (series 1033, image 3). Posterior communicating arteries are diminutive or absent, bilaterally. Anatomic variants: As described. IMPRESSION: MRI brain: 1. Punctate acute infarct within the right occipital lobe (PCA vascular territory). 2. Punctate acute infarct within the left occipital lobe (PCA  vascular territory). 3. 14 mm acute infarct within the left cerebellar hemisphere. 4. Chronic lacunar infarcts again noted within the bilateral cerebral hemispheric white matter and within/about the bilateral deep gray nuclei. 5. Background advanced cerebral white matter disease  which has progressed from the prior brain MRI of 10/03/2011. Findings likely reflect a combination of chronic small vessel ischemic disease and demyelinating disease given the patient's age and history of multiple sclerosis. 6. Mild-to-moderate cerebral atrophy. MRA head: 1. Mildly motion degraded exam. 2. Sites of occlusion within the intracranial right vertebral artery which, by report, are new from the prior MRA head of 09/30/2001. 3. Background intracranial atherosclerotic disease with multifocal stenoses, most notably as follows. 4. Moderate/severe focal stenosis within the cavernous left internal carotid artery. 5. Moderate stenosis within the left anterior cerebral artery proximal A2 segment. 6. Severe stenosis within a left posterior cerebral artery branch at the P3/P4 junction. 7. Two 3 mm foci along the cavernous left internal carotid artery, as described and suspicious for arterial aneurysm. A CTA of the head is recommended for further evaluation. Electronically Signed   By: Jackey Loge D.O.   On: 09/29/2023 19:13   MR ANGIO HEAD WO CONTRAST Result Date: 09/29/2023 CLINICAL DATA:  Provided history: Stroke, follow-up. Additional history obtained from electronic MEDICAL RECORD NUMBERHistory of multiple sclerosis. EXAM: MRI HEAD WITHOUT CONTRAST MRA HEAD WITHOUT CONTRAST TECHNIQUE: Multiplanar, multi-echo pulse sequences of the brain and surrounding structures were acquired without intravenous contrast. Angiographic images of the Circle of Willis were acquired using MRA technique without intravenous contrast. COMPARISON:  Head CT 08/24/2022. Brain MRI 10/03/2011. Report from MRA head 09/30/2001 (images unavailable). FINDINGS: MRI  HEAD FINDINGS Brain: Mild-to-moderate cerebral atrophy. Punctate acute infarct within the right occipital lobe near the gray-white junction (PCA vascular territory) (series 5, image 21). Punctate acute infarct within the left occipital lobe white matter (PCA territory) (series 5, image 18). 14 mm acute infarct within the left cerebellar hemisphere (series 5, image 7). Multiple chronic lacunar infarcts again noted within the bilateral cerebral hemispheric white matter and within/about the bilateral deep gray nuclei. Chronic hemosiderin deposition associated with some of these infarcts. Background advanced patchy and confluent T2 FLAIR hyperintense signal abnormality within the cerebral white matter, likely reflecting a combination of chronic small ischemic disease and demyelinating disease given patient's age and history of multiple sclerosis. No cortical encephalomalacia is identified. No evidence of an intracranial mass. No extra-axial fluid collection. No midline shift. Vascular: Reported below under the MRA section. Skull and upper cervical spine: No focal worrisome marrow lesion. Incompletely assessed cervical spondylosis. Sinuses/Orbits: No mass or acute finding within the imaged orbits. Prior bilateral ocular lens replacement. Minimal mucosal thickening within the bilateral ethmoid and right maxillary sinuses. MRA HEAD FINDINGS Mildly motion degraded examination. Within this limitation, findings are as follows. Anterior circulation: The intracranial internal carotid arteries are patent. Atherosclerotic irregularity of both vessels. Moderate/severe focal stenosis within the cavernous left ICA. No more than mild stenosis of the intracranial internal carotid arteries elsewhere. The M1 middle cerebral arteries are patent. Atherosclerotic irregularity of the M2 and more distal MCA vessels, bilaterally. No M2 proximal branch occlusion or high-grade proximal M2 stenosis is identified. The anterior cerebral arteries  are patent. Moderate stenosis within the left anterior cerebral artery proximal A2 segment. 3 mm focus of flow-related signal along the medial aspect of the cavernous left internal carotid artery suspicious for an aneurysm (for instance as seen on series 12, image 70). 3 mm inferomedially projecting vascular protrusion arising from the cavernous left internal carotid artery more distally, also suspicious for an aneurysm (for instance as seen on series 1021, image 13). Posterior circulation: Sites of occlusion within the V4 right vertebral artery, by report new from the prior  MRA head of 09/30/2001. Flow related signal is present within the proximal right PICA. The visualized intracranial left vertebral artery is patent without stenosis. The basilar artery is patent. The posterior cerebral arteries are patent. Atherosclerotic irregularity of both vessels. Most notably, there is a severe stenosis within a left PCA branch at the P3/P4 junction (series 1033, image 3). Posterior communicating arteries are diminutive or absent, bilaterally. Anatomic variants: As described. IMPRESSION: MRI brain: 1. Punctate acute infarct within the right occipital lobe (PCA vascular territory). 2. Punctate acute infarct within the left occipital lobe (PCA vascular territory). 3. 14 mm acute infarct within the left cerebellar hemisphere. 4. Chronic lacunar infarcts again noted within the bilateral cerebral hemispheric white matter and within/about the bilateral deep gray nuclei. 5. Background advanced cerebral white matter disease which has progressed from the prior brain MRI of 10/03/2011. Findings likely reflect a combination of chronic small vessel ischemic disease and demyelinating disease given the patient's age and history of multiple sclerosis. 6. Mild-to-moderate cerebral atrophy. MRA head: 1. Mildly motion degraded exam. 2. Sites of occlusion within the intracranial right vertebral artery which, by report, are new from the prior  MRA head of 09/30/2001. 3. Background intracranial atherosclerotic disease with multifocal stenoses, most notably as follows. 4. Moderate/severe focal stenosis within the cavernous left internal carotid artery. 5. Moderate stenosis within the left anterior cerebral artery proximal A2 segment. 6. Severe stenosis within a left posterior cerebral artery branch at the P3/P4 junction. 7. Two 3 mm foci along the cavernous left internal carotid artery, as described and suspicious for arterial aneurysm. A CTA of the head is recommended for further evaluation. Electronically Signed   By: Jackey Loge D.O.   On: 09/29/2023 19:13   DG Chest Portable 1 View Result Date: 09/29/2023 CLINICAL DATA:  Hypoxia.  Cough.  Recent diagnosis of COVID. EXAM: PORTABLE CHEST 1 VIEW COMPARISON:  08/24/2022 FINDINGS: Unchanged cardiac silhouette and mediastinal contours with atherosclerotic plaque within the thoracic aorta. Interval development of heterogeneous airspace opacities within the right mid and bilateral lower lungs. Lungs remain hyperexpanded with thinning of the biapical pulmonary parenchyma. Trace right-sided pleural effusion is not excluded. No pneumothorax. No acute osseous abnormalities. Degenerative change of the left glenohumeral joint is suspected though incompletely evaluated. IMPRESSION: Findings worrisome for multifocal pneumonia, right-greater-than-left, superimposed on advanced emphysematous change. A follow-up chest radiograph in 3 to 4 weeks after treatment is recommended to ensure resolution. Electronically Signed   By: Simonne Come M.D.   On: 09/29/2023 16:06     Subjective: No specific complaints, tolerated procedure well, tolerating diet.   Discharge Exam: Vitals:   10/29/23 1050 10/29/23 1347  BP: 107/67 118/68  Pulse: 90 93  Resp: 18 17  Temp: 98.4 F (36.9 C) 98.3 F (36.8 C)  SpO2: 92% 93%   Vitals:   10/28/23 1854 10/29/23 0613 10/29/23 1050 10/29/23 1347  BP: 128/78 115/65 107/67 118/68   Pulse: 86 84 90 93  Resp: 18 16 18 17   Temp: 98.7 F (37.1 C) 98.6 F (37 C) 98.4 F (36.9 C) 98.3 F (36.8 C)  TempSrc: Oral Oral Oral Oral  SpO2: 95% 98% 92% 93%  Weight:      Height:       General: Pt is cachectic appearing, alert, awake, not in acute distress Cardiovascular: normal S1/S2 +, no rubs, no gallops Respiratory: CTA bilaterally, no wheezing, no rhonchi GU: PCN draining amber urine Abdominal: Soft, NT, ND, bowel sounds + Extremities: no edema, no cyanosis  The results of significant diagnostics from this hospitalization (including imaging, microbiology, ancillary and laboratory) are listed below for reference.     Microbiology: No results found for this or any previous visit (from the past 240 hours).   Labs: BNP (last 3 results) Recent Labs    09/29/23 1411  BNP 488.0*   Basic Metabolic Panel: Recent Labs  Lab 10/28/23 1813 10/29/23 0320  NA 134* 134*  K 3.8 3.8  CL 100 100  CO2 23 22  GLUCOSE 116* 112*  BUN 22 21  CREATININE 1.79* 1.73*  CALCIUM 8.7* 8.5*   Liver Function Tests: No results for input(s): "AST", "ALT", "ALKPHOS", "BILITOT", "PROT", "ALBUMIN" in the last 168 hours. No results for input(s): "LIPASE", "AMYLASE" in the last 168 hours. No results for input(s): "AMMONIA" in the last 168 hours. CBC: Recent Labs  Lab 10/28/23 1813 10/29/23 0320  WBC 13.9* 15.5*  NEUTROABS 11.0*  --   HGB 10.4* 10.0*  HCT 33.1* 32.0*  MCV 93.0 92.2  PLT 427* 367   Cardiac Enzymes: No results for input(s): "CKTOTAL", "CKMB", "CKMBINDEX", "TROPONINI" in the last 168 hours. BNP: Invalid input(s): "POCBNP" CBG: No results for input(s): "GLUCAP" in the last 168 hours. D-Dimer No results for input(s): "DDIMER" in the last 72 hours. Hgb A1c No results for input(s): "HGBA1C" in the last 72 hours. Lipid Profile No results for input(s): "CHOL", "HDL", "LDLCALC", "TRIG", "CHOLHDL", "LDLDIRECT" in the last 72 hours. Thyroid function studies No  results for input(s): "TSH", "T4TOTAL", "T3FREE", "THYROIDAB" in the last 72 hours.  Invalid input(s): "FREET3" Anemia work up No results for input(s): "VITAMINB12", "FOLATE", "FERRITIN", "TIBC", "IRON", "RETICCTPCT" in the last 72 hours. Urinalysis    Component Value Date/Time   COLORURINE YELLOW 10/28/2023 2137   APPEARANCEUR HAZY (A) 10/28/2023 2137   LABSPEC 1.009 10/28/2023 2137   PHURINE 7.0 10/28/2023 2137   GLUCOSEU NEGATIVE 10/28/2023 2137   HGBUR MODERATE (A) 10/28/2023 2137   BILIRUBINUR NEGATIVE 10/28/2023 2137   BILIRUBINUR small (A) 10/26/2021 1538   KETONESUR NEGATIVE 10/28/2023 2137   PROTEINUR 30 (A) 10/28/2023 2137   UROBILINOGEN 0.2 10/26/2021 1538   UROBILINOGEN 0.2 07/17/2014 1215   NITRITE POSITIVE (A) 10/28/2023 2137   LEUKOCYTESUR LARGE (A) 10/28/2023 2137   Sepsis Labs Recent Labs  Lab 10/28/23 1813 10/29/23 0320  WBC 13.9* 15.5*   Microbiology No results found for this or any previous visit (from the past 240 hours).  Time coordinating discharge:   SIGNED:  Standley Dakins, MD  [facility] 10/29/2023, 1:51 PM How to contact the [facility] Attending or Consulting provider 7A - 7P or covering provider during after hours 7P -7A, for this patient?  Check the care team in [facility] and look for a) attending/consulting TRH provider listed and b) the [facility] team listed Log into [email or web address] and use [place]'s universal password to access. If you do not have the password, please contact the hospital operator. Locate the [facility] provider you are looking for under [facility] and page to a number that you can be directly reached. If you still have difficulty reaching the provider, please page the [facility] (Director on Call) for the Hospitalists listed on amion for assistance.

## 2023-10-29 NOTE — Progress Notes (Signed)
[facility] came to transport patient to [facility]. I called the facility to confirm that patient could be returned to facility at this hour of the night and they said he could come tonight.

## 2023-10-29 NOTE — Assessment & Plan Note (Signed)
-  We will continue statin therapy.

## 2023-10-29 NOTE — ED Notes (Signed)
Calling [facility] for Jordan Ward, pt will be coming back

## 2023-10-29 NOTE — Discharge Instructions (Signed)
IMPORTANT INFORMATION: PAY CLOSE ATTENTION   PHYSICIAN DISCHARGE INSTRUCTIONS  Follow with Primary care provider  Benita Stabile, MD  and other consultants as instructed by your Hospitalist Physician  SEEK MEDICAL CARE OR RETURN TO EMERGENCY ROOM IF SYMPTOMS COME BACK, WORSEN OR NEW PROBLEM DEVELOPS   Please note: You were cared for by a hospitalist during your hospital stay. Every effort will be made to forward records to your primary care provider.  You can request that your primary care provider send for your hospital records if they have not received them.  Once you are discharged, your primary care physician will handle any further medical issues. Please note that NO REFILLS for any discharge medications will be authorized once you are discharged, as it is imperative that you return to your primary care physician (or establish a relationship with a primary care physician if you do not have one) for your post hospital discharge needs so that they can reassess your need for medications and monitor your lab values.  Please get a complete blood count and chemistry panel checked by your Primary MD at your next visit, and again as instructed by your Primary MD.  Get Medicines reviewed and adjusted: Please take all your medications with you for your next visit with your Primary MD  Laboratory/radiological data: Please request your Primary MD to go over all hospital tests and procedure/radiological results at the follow up, please ask your primary care provider to get all Hospital records sent to his/her office.  In some cases, they will be blood work, cultures and biopsy results pending at the time of your discharge. Please request that your primary care provider follow up on these results.  If you are diabetic, please bring your blood sugar readings with you to your follow up appointment with primary care.    Please call and make your follow up appointments as soon as possible.    Also Note the  following: If you experience worsening of your admission symptoms, develop shortness of breath, life threatening emergency, suicidal or homicidal thoughts you must seek medical attention immediately by calling 911 or calling your MD immediately  if symptoms less severe.  You must read complete instructions/literature along with all the possible adverse reactions/side effects for all the Medicines you take and that have been prescribed to you. Take any new Medicines after you have completely understood and accpet all the possible adverse reactions/side effects.   Do not drive when taking Pain medications or sleeping medications (Benzodiazepines)  Do not take more than prescribed Pain, Sleep and Anxiety Medications. It is not advisable to combine anxiety,sleep and pain medications without talking with your primary care practitioner  Special Instructions: If you have smoked or chewed Tobacco  in the last 2 yrs please stop smoking, stop any regular Alcohol  and or any Recreational drug use.  Wear Seat belts while driving.  Do not drive if taking any narcotic, mind altering or controlled substances or recreational drugs or alcohol.

## 2023-10-29 NOTE — TOC Transition Note (Signed)
Transition of Care [facility]) - Discharge Note   Patient Details  Name: Jordan Ward MRN: [id number] Date of Birth: 26-Oct-1940  [facility] CM/SW Contact:  Villa Herb, LCSWA Phone Number: 10/29/2023, 3:24 PM  Clinical Narrative:    CSW updated that pt arrived back from [facility] for IR. Pt is medically stable and ready for D/C back to [facility] SNF. CSW spoke to [name] in admissions who states they are ready to accept pt back. D/C clinicals sent in HUB. Wife updated by MD. Med necessity printed to floor. EMS called for transport. TOC signing off.   Final next level of care: Skilled Nursing Facility Barriers to Discharge: Barriers Resolved   Patient Goals and CMS Choice Patient states their goals for this hospitalization and ongoing recovery are:: go to SNF CMS Medicare.gov Compare Post Acute Care list provided to:: Patient Represenative (must comment) Choice offered to / list presented to : Spouse [place] ownership interest in [facility]provided to:: Spouse    Discharge Placement                       Discharge Plan and Services Additional resources added to the After Visit Summary for   In-house Referral: Clinical Social Work Discharge Planning Services: CM Consult Post Acute Care Choice: Skilled Nursing Facility                               Social Drivers of Health (SDOH) Interventions SDOH Screenings   Food Insecurity: No Food Insecurity (10/29/2023)  Housing: Low Risk  (10/29/2023)  Transportation Needs: No Transportation Needs (10/29/2023)  Utilities: Not At Risk (10/29/2023)  Depression (PHQ2-9): Low Risk  (09/26/2022)  Financial Resource Strain: Low Risk  (09/26/2022)  Social Connections: Moderately Isolated (10/29/2023)  Stress: No Stress Concern Present (09/26/2022)  Tobacco Use: Medium Risk (10/28/2023)  Health Literacy: Medium Risk (10/21/2023)   Received from [facility]     Readmission Risk Interventions    10/12/2023    11:35 AM 08/25/2022    3:58 PM 07/15/2021    9:54 AM  Readmission Risk Prevention Plan  Transportation Screening Complete Complete   PCP or Specialist Appt within 3-5 Days   Complete  Home Care Screening Complete Complete   Medication Review (RN CM) Complete Complete   HRI or Home Care Consult   Complete  Social Work Consult for Recovery Care Planning/Counseling   Complete  Palliative Care Screening   Complete  Medication Review Oceanographer)   Complete

## 2023-10-29 NOTE — Care Management Obs Status (Signed)
MEDICARE OBSERVATION STATUS NOTIFICATION   Patient Details  Name: Jordan Ward MRN: [id number] Date of Birth: Oct 01, 1940   Medicare Observation Status Notification Given:  Yes    Corey Harold 10/29/2023, 4:51 PM

## 2023-10-29 NOTE — Procedures (Signed)
Interventional Radiology Procedure Note  Procedure: Image guided rescue of displaced left PCN.  New 39F pigtail drain.  Complications: None  EBL: None    Recommendations: - Routine drain care, to gravity   - routine wound care  Signed,  Yvone Neu. Loreta Ave, DO

## 2023-10-29 NOTE — Progress Notes (Signed)
Spoke with wife who had concerns with patient going back to SNF. She stated the provider told her he would be in the hospital overnight post procedure.  She states she has had a lot off issues with his visit and they were in the ER waiting an extreme long period of time.  I explained the process and apologized for the delays.  I attempted service recovery.  At the end of the conversation she hung up abruptly.  I consulted with Dr. Laural Benes and he care team who agreed he need to be discharged as planned back to the SNF.

## 2023-10-29 NOTE — Assessment & Plan Note (Signed)
-   We will continue antihypertensive therapy.

## 2023-10-29 NOTE — Assessment & Plan Note (Signed)
-   The patient will be admitted to an observation bed at [facility]. - IR consult was ordered. - IR was contacted earlier and is aware about the patient.  They will need to be notified in AM. - The patient will be kept n.p.o. after midnight.

## 2023-10-29 NOTE — TOC Initial Note (Signed)
Transition of Care [facility]) - Initial/Assessment Note    Patient Details  Name: Jordan Ward MRN: [id number] Date of Birth: 1941/06/18  [facility] CM/SW Contact:    Villa Herb, LCSWA Phone Number: 10/29/2023, 11:29 AM  Clinical Narrative:                 CSW noted per chart that pt arrived from SNF facility. CSW reviewed chart and per previous TOC notes pt arrived from [facility] SNF. CSW spoke to [name] in admissions who states pt can return once medically stable. CSW spoke with pts wife who states plan is for return to SNF at [facility] once medically stable. TOC to follow.   Expected Discharge Plan: Skilled Nursing Facility Barriers to Discharge: Continued Medical Work up   Patient Goals and CMS Choice Patient states their goals for this hospitalization and ongoing recovery are:: go to SNF CMS Medicare.gov Compare Post Acute Care list provided to:: Patient Represenative (must comment) Choice offered to / list presented to : Spouse Shelley ownership interest in [facility].provided to:: Spouse    Expected Discharge Plan and Services In-house Referral: Clinical Social Work Discharge Planning Services: CM Consult Post Acute Care Choice: Skilled Nursing Facility Living arrangements for the past 2 months: Single Family Home                                      Prior Living Arrangements/Services Living arrangements for the past 2 months: Single Family Home Lives with:: Spouse Patient language and need for interpreter reviewed:: Yes Do you feel safe going back to the place where you live?: Yes      Need for Family Participation in Patient Care: Yes (Comment) Care giver support system in place?: Yes (comment) Current home services: DME Criminal Activity/Legal Involvement Pertinent to Current Situation/Hospitalization: No - Comment as needed  Activities of Daily Living      Permission Sought/Granted                  Emotional  Assessment Appearance:: Appears stated age       Alcohol / Substance Use: Not Applicable Psych Involvement: No (comment)  Admission diagnosis:  Displacement of nephrostomy tube (HCC) [T83.022A] Flank pain [R10.9] Patient Active Problem List   Diagnosis Date Noted   Dyslipidemia 10/29/2023   Anxiety 10/29/2023   Flank pain 10/29/2023   Displacement of nephrostomy tube (HCC) 10/28/2023   COVID-19 09/30/2023   CKD (chronic kidney disease) stage 4, GFR 15-29 ml/min (HCC) 09/29/2023   Nephrostomy present (HCC) 09/29/2023   Emphysema lung (HCC) 09/29/2023   Right renal atrophy 07/30/2023   Hypokalemia 05/19/2022   Pressure ulcer, heel 07/14/2021   Rigors 03/07/2021   Encounter for removal of vascular catheter 09/14/2020   Pneumonia due to COVID-19 virus 07/12/2020   Bacteremia due to Gram-negative bacteria 05/04/2020   Hydronephrosis 05/02/2020   Dehydration 03/02/2020   Nausea and vomiting 03/02/2020   Ventral hernia without obstruction or gangrene 03/01/2020   Hypotension 03/01/2020   Poor appetite 03/01/2020   Detrusor areflexia 09/16/2019   Cerebrovascular disease 08/17/2018   Partial small bowel obstruction (HCC) 07/11/2017   Choledocholithiasis 07/11/2017   Paroxysmal atrial tachycardia (HCC) 06/14/2017   Hypophosphatemia 06/14/2017   Pressure injury of skin 05/26/2017   Hydronephrosis due to obstruction of ureter 05/26/2017   Obstructive uropathy 05/25/2017   CKD (chronic kidney disease), stage III (HCC) 08/23/2016  Hydronephrosis of left kidney 08/23/2016   Kidney stones 09/13/2015   Essential hypertension    Pressure ulcer 08/04/2015   Chronic diastolic (congestive) heart failure (HCC) 08/04/2015   Hydronephrosis with obstructing calculus 08/04/2015   Occult blood positive stool 09/05/2014   Edema 08/19/2014   Dilated cardiomyopathy (HCC) 07/21/2014   Headache 03/13/2014   Cerebral infarction (HCC) 03/13/2014   Abnormality of gait 03/13/2014   Tremors of  nervous system 10/08/2011   Hypothyroidism 10/08/2011   Pulmonary nodule 10/08/2011   Dysphagia 10/07/2011   HTN (hypertension), malignant 10/06/2011   Chronic suprapubic catheter (HCC) 10/06/2011   Pulmonary fibrosis (HCC) 10/06/2011   UTI (urinary tract infection) 10/03/2011   Junctional rhythm 10/02/2011   Multiple sclerosis (HCC) 10/02/2011   Chronic diarrhea 06/17/2011   Hx of adenomatous colonic polyps 06/17/2011   High grade dysplasia in colonic adenoma 09/29/2005   PCP:  Benita Stabile, MD Pharmacy:   [facility] [place], [place] - [address] [place] [address] Phone: [phone] Fax: [phone]     Social Drivers of Health (SDOH) Social History: SDOH Screenings   Food Insecurity: No Food Insecurity (09/30/2023)  Housing: Unknown (09/30/2023)  Transportation Needs: No Transportation Needs (10/21/2023)   Received from [facility]  Utilities: Not At Risk (09/30/2023)  Depression (PHQ2-9): Low Risk  (09/26/2022)  Financial Resource Strain: Low Risk  (09/26/2022)  Social Connections: Socially Isolated (09/30/2023)  Stress: No Stress Concern Present (09/26/2022)  Tobacco Use: Medium Risk (10/28/2023)  Health Literacy: Medium Risk (10/21/2023)   Received from [facility]   SDOH Interventions:     Readmission Risk Interventions    10/12/2023   11:35 AM 08/25/2022    3:58 PM 07/15/2021    9:54 AM  Readmission Risk Prevention Plan  Transportation Screening Complete Complete   PCP or Specialist Appt within 3-5 Days   Complete  Home Care Screening Complete Complete   Medication Review (RN CM) Complete Complete   HRI or Home Care Consult   Complete  Social Work Consult for Recovery Care Planning/Counseling   Complete  Palliative Care Screening   Complete  Medication Review Oceanographer)   Complete

## 2023-10-29 NOTE — Assessment & Plan Note (Signed)
-  We will continue Valium.

## 2023-10-30 NOTE — Progress Notes (Signed)
Likely colonized sample given from suprapubic catheter

## 2023-11-01 DIAGNOSIS — Z436 Encounter for attention to other artificial openings of urinary tract: Secondary | ICD-10-CM | POA: Diagnosis not present

## 2023-11-01 DIAGNOSIS — R5381 Other malaise: Secondary | ICD-10-CM | POA: Diagnosis not present

## 2023-11-01 DIAGNOSIS — R1312 Dysphagia, oropharyngeal phase: Secondary | ICD-10-CM | POA: Diagnosis present

## 2023-11-01 DIAGNOSIS — N184 Chronic kidney disease, stage 4 (severe): Secondary | ICD-10-CM | POA: Diagnosis present

## 2023-11-01 DIAGNOSIS — R4181 Age-related cognitive decline: Secondary | ICD-10-CM | POA: Diagnosis present

## 2023-11-01 DIAGNOSIS — M5021 Other cervical disc displacement,  high cervical region: Secondary | ICD-10-CM | POA: Diagnosis not present

## 2023-11-01 DIAGNOSIS — Z7401 Bed confinement status: Secondary | ICD-10-CM | POA: Diagnosis not present

## 2023-11-01 DIAGNOSIS — M4802 Spinal stenosis, cervical region: Secondary | ICD-10-CM | POA: Diagnosis not present

## 2023-11-01 DIAGNOSIS — Z79899 Other long term (current) drug therapy: Secondary | ICD-10-CM | POA: Diagnosis not present

## 2023-11-01 DIAGNOSIS — Z9981 Dependence on supplemental oxygen: Secondary | ICD-10-CM | POA: Diagnosis not present

## 2023-11-01 DIAGNOSIS — E039 Hypothyroidism, unspecified: Secondary | ICD-10-CM | POA: Diagnosis present

## 2023-11-01 DIAGNOSIS — I050 Rheumatic mitral stenosis: Secondary | ICD-10-CM | POA: Diagnosis present

## 2023-11-01 DIAGNOSIS — R7881 Bacteremia: Secondary | ICD-10-CM | POA: Diagnosis present

## 2023-11-01 DIAGNOSIS — Z79890 Hormone replacement therapy (postmenopausal): Secondary | ICD-10-CM | POA: Diagnosis not present

## 2023-11-01 DIAGNOSIS — F331 Major depressive disorder, recurrent, moderate: Secondary | ICD-10-CM | POA: Diagnosis present

## 2023-11-01 DIAGNOSIS — R0989 Other specified symptoms and signs involving the circulatory and respiratory systems: Secondary | ICD-10-CM | POA: Diagnosis not present

## 2023-11-01 DIAGNOSIS — R059 Cough, unspecified: Secondary | ICD-10-CM | POA: Diagnosis not present

## 2023-11-01 DIAGNOSIS — L8994 Pressure ulcer of unspecified site, stage 4: Secondary | ICD-10-CM | POA: Diagnosis not present

## 2023-11-01 DIAGNOSIS — S91001A Unspecified open wound, right ankle, initial encounter: Secondary | ICD-10-CM | POA: Diagnosis not present

## 2023-11-01 DIAGNOSIS — L89520 Pressure ulcer of left ankle, unstageable: Secondary | ICD-10-CM | POA: Diagnosis present

## 2023-11-01 DIAGNOSIS — Z7982 Long term (current) use of aspirin: Secondary | ICD-10-CM | POA: Diagnosis not present

## 2023-11-01 DIAGNOSIS — I739 Peripheral vascular disease, unspecified: Secondary | ICD-10-CM | POA: Diagnosis present

## 2023-11-01 DIAGNOSIS — L89226 Pressure-induced deep tissue damage of left hip: Secondary | ICD-10-CM | POA: Diagnosis present

## 2023-11-01 DIAGNOSIS — I420 Dilated cardiomyopathy: Secondary | ICD-10-CM | POA: Diagnosis present

## 2023-11-01 DIAGNOSIS — K439 Ventral hernia without obstruction or gangrene: Secondary | ICD-10-CM | POA: Diagnosis not present

## 2023-11-01 DIAGNOSIS — L89620 Pressure ulcer of left heel, unstageable: Secondary | ICD-10-CM | POA: Diagnosis present

## 2023-11-01 DIAGNOSIS — R918 Other nonspecific abnormal finding of lung field: Secondary | ICD-10-CM | POA: Diagnosis not present

## 2023-11-01 DIAGNOSIS — Z936 Other artificial openings of urinary tract status: Secondary | ICD-10-CM | POA: Diagnosis not present

## 2023-11-01 DIAGNOSIS — N1832 Chronic kidney disease, stage 3b: Secondary | ICD-10-CM | POA: Diagnosis present

## 2023-11-01 DIAGNOSIS — I509 Heart failure, unspecified: Secondary | ICD-10-CM | POA: Diagnosis present

## 2023-11-01 DIAGNOSIS — E440 Moderate protein-calorie malnutrition: Secondary | ICD-10-CM | POA: Diagnosis present

## 2023-11-01 DIAGNOSIS — J189 Pneumonia, unspecified organism: Secondary | ICD-10-CM | POA: Diagnosis present

## 2023-11-01 DIAGNOSIS — Z66 Do not resuscitate: Secondary | ICD-10-CM | POA: Diagnosis present

## 2023-11-01 DIAGNOSIS — L89610 Pressure ulcer of right heel, unstageable: Secondary | ICD-10-CM | POA: Diagnosis present

## 2023-11-01 DIAGNOSIS — L89150 Pressure ulcer of sacral region, unstageable: Secondary | ICD-10-CM | POA: Diagnosis not present

## 2023-11-01 DIAGNOSIS — B952 Enterococcus as the cause of diseases classified elsewhere: Secondary | ICD-10-CM | POA: Diagnosis not present

## 2023-11-01 DIAGNOSIS — L89154 Pressure ulcer of sacral region, stage 4: Secondary | ICD-10-CM | POA: Diagnosis present

## 2023-11-01 DIAGNOSIS — L089 Local infection of the skin and subcutaneous tissue, unspecified: Secondary | ICD-10-CM | POA: Diagnosis not present

## 2023-11-01 DIAGNOSIS — L89324 Pressure ulcer of left buttock, stage 4: Secondary | ICD-10-CM | POA: Diagnosis present

## 2023-11-01 DIAGNOSIS — A419 Sepsis, unspecified organism: Secondary | ICD-10-CM | POA: Diagnosis not present

## 2023-11-01 DIAGNOSIS — N281 Cyst of kidney, acquired: Secondary | ICD-10-CM | POA: Diagnosis not present

## 2023-11-01 DIAGNOSIS — L89896 Pressure-induced deep tissue damage of other site: Secondary | ICD-10-CM | POA: Diagnosis not present

## 2023-11-01 DIAGNOSIS — G894 Chronic pain syndrome: Secondary | ICD-10-CM | POA: Diagnosis present

## 2023-11-01 DIAGNOSIS — R531 Weakness: Secondary | ICD-10-CM | POA: Diagnosis not present

## 2023-11-01 DIAGNOSIS — I130 Hypertensive heart and chronic kidney disease with heart failure and stage 1 through stage 4 chronic kidney disease, or unspecified chronic kidney disease: Secondary | ICD-10-CM | POA: Diagnosis present

## 2023-11-01 DIAGNOSIS — M869 Osteomyelitis, unspecified: Secondary | ICD-10-CM | POA: Diagnosis present

## 2023-11-01 DIAGNOSIS — R040 Epistaxis: Secondary | ICD-10-CM | POA: Diagnosis not present

## 2023-11-01 DIAGNOSIS — F419 Anxiety disorder, unspecified: Secondary | ICD-10-CM | POA: Diagnosis present

## 2023-11-01 DIAGNOSIS — B965 Pseudomonas (aeruginosa) (mallei) (pseudomallei) as the cause of diseases classified elsewhere: Secondary | ICD-10-CM | POA: Diagnosis present

## 2023-11-01 DIAGNOSIS — J9621 Acute and chronic respiratory failure with hypoxia: Secondary | ICD-10-CM | POA: Diagnosis present

## 2023-11-01 DIAGNOSIS — S31829A Unspecified open wound of left buttock, initial encounter: Secondary | ICD-10-CM | POA: Diagnosis not present

## 2023-11-01 DIAGNOSIS — N132 Hydronephrosis with renal and ureteral calculous obstruction: Secondary | ICD-10-CM | POA: Diagnosis not present

## 2023-11-01 DIAGNOSIS — M50222 Other cervical disc displacement at C5-C6 level: Secondary | ICD-10-CM | POA: Diagnosis not present

## 2023-11-01 DIAGNOSIS — L89159 Pressure ulcer of sacral region, unspecified stage: Secondary | ICD-10-CM | POA: Diagnosis not present

## 2023-11-01 DIAGNOSIS — L89216 Pressure-induced deep tissue damage of right hip: Secondary | ICD-10-CM | POA: Diagnosis present

## 2023-11-01 DIAGNOSIS — Z20822 Contact with and (suspected) exposure to covid-19: Secondary | ICD-10-CM | POA: Diagnosis not present

## 2023-11-01 DIAGNOSIS — J449 Chronic obstructive pulmonary disease, unspecified: Secondary | ICD-10-CM | POA: Diagnosis not present

## 2023-11-01 DIAGNOSIS — L89519 Pressure ulcer of right ankle, unspecified stage: Secondary | ICD-10-CM | POA: Diagnosis present

## 2023-11-01 DIAGNOSIS — S91302A Unspecified open wound, left foot, initial encounter: Secondary | ICD-10-CM | POA: Diagnosis not present

## 2023-11-01 DIAGNOSIS — Z1152 Encounter for screening for COVID-19: Secondary | ICD-10-CM | POA: Diagnosis not present

## 2023-11-01 DIAGNOSIS — M47811 Spondylosis without myelopathy or radiculopathy, occipito-atlanto-axial region: Secondary | ICD-10-CM | POA: Diagnosis not present

## 2023-11-01 DIAGNOSIS — Z681 Body mass index (BMI) 19 or less, adult: Secondary | ICD-10-CM | POA: Diagnosis not present

## 2023-11-01 DIAGNOSIS — J440 Chronic obstructive pulmonary disease with acute lower respiratory infection: Secondary | ICD-10-CM | POA: Diagnosis present

## 2023-11-01 DIAGNOSIS — F411 Generalized anxiety disorder: Secondary | ICD-10-CM | POA: Diagnosis not present

## 2023-11-01 DIAGNOSIS — I700 Atherosclerosis of aorta: Secondary | ICD-10-CM | POA: Diagnosis present

## 2023-11-01 DIAGNOSIS — Z792 Long term (current) use of antibiotics: Secondary | ICD-10-CM | POA: Diagnosis not present

## 2023-11-01 DIAGNOSIS — G35 Multiple sclerosis: Secondary | ICD-10-CM | POA: Diagnosis not present

## 2023-11-01 DIAGNOSIS — M4628 Osteomyelitis of vertebra, sacral and sacrococcygeal region: Secondary | ICD-10-CM | POA: Diagnosis present

## 2023-11-01 DIAGNOSIS — R54 Age-related physical debility: Secondary | ICD-10-CM | POA: Diagnosis present

## 2023-11-01 DIAGNOSIS — N390 Urinary tract infection, site not specified: Secondary | ICD-10-CM | POA: Diagnosis present

## 2023-11-01 LAB — URINE CULTURE: Culture: >=100000 — AB

## 2023-11-03 DIAGNOSIS — L89519 Pressure ulcer of right ankle, unspecified stage: Secondary | ICD-10-CM | POA: Diagnosis present

## 2023-11-03 DIAGNOSIS — E440 Moderate protein-calorie malnutrition: Secondary | ICD-10-CM | POA: Diagnosis present

## 2023-11-03 DIAGNOSIS — L89896 Pressure-induced deep tissue damage of other site: Secondary | ICD-10-CM | POA: Diagnosis not present

## 2023-11-03 DIAGNOSIS — Z681 Body mass index (BMI) 19 or less, adult: Secondary | ICD-10-CM | POA: Diagnosis not present

## 2023-11-03 DIAGNOSIS — K439 Ventral hernia without obstruction or gangrene: Secondary | ICD-10-CM | POA: Diagnosis not present

## 2023-11-03 DIAGNOSIS — N132 Hydronephrosis with renal and ureteral calculous obstruction: Secondary | ICD-10-CM | POA: Diagnosis not present

## 2023-11-03 DIAGNOSIS — Z79899 Other long term (current) drug therapy: Secondary | ICD-10-CM | POA: Diagnosis not present

## 2023-11-03 DIAGNOSIS — N281 Cyst of kidney, acquired: Secondary | ICD-10-CM | POA: Diagnosis not present

## 2023-11-03 DIAGNOSIS — Z66 Do not resuscitate: Secondary | ICD-10-CM | POA: Diagnosis present

## 2023-11-03 DIAGNOSIS — B952 Enterococcus as the cause of diseases classified elsewhere: Secondary | ICD-10-CM | POA: Diagnosis not present

## 2023-11-03 DIAGNOSIS — Z9981 Dependence on supplemental oxygen: Secondary | ICD-10-CM | POA: Diagnosis not present

## 2023-11-03 DIAGNOSIS — J449 Chronic obstructive pulmonary disease, unspecified: Secondary | ICD-10-CM | POA: Diagnosis not present

## 2023-11-03 DIAGNOSIS — M47811 Spondylosis without myelopathy or radiculopathy, occipito-atlanto-axial region: Secondary | ICD-10-CM | POA: Diagnosis not present

## 2023-11-03 DIAGNOSIS — N184 Chronic kidney disease, stage 4 (severe): Secondary | ICD-10-CM | POA: Diagnosis present

## 2023-11-03 DIAGNOSIS — Z936 Other artificial openings of urinary tract status: Secondary | ICD-10-CM | POA: Diagnosis not present

## 2023-11-03 DIAGNOSIS — R918 Other nonspecific abnormal finding of lung field: Secondary | ICD-10-CM | POA: Diagnosis not present

## 2023-11-03 DIAGNOSIS — I130 Hypertensive heart and chronic kidney disease with heart failure and stage 1 through stage 4 chronic kidney disease, or unspecified chronic kidney disease: Secondary | ICD-10-CM | POA: Diagnosis present

## 2023-11-03 DIAGNOSIS — L89324 Pressure ulcer of left buttock, stage 4: Secondary | ICD-10-CM | POA: Diagnosis present

## 2023-11-03 DIAGNOSIS — S31829A Unspecified open wound of left buttock, initial encounter: Secondary | ICD-10-CM | POA: Diagnosis not present

## 2023-11-03 DIAGNOSIS — Z1152 Encounter for screening for COVID-19: Secondary | ICD-10-CM | POA: Diagnosis not present

## 2023-11-03 DIAGNOSIS — M4802 Spinal stenosis, cervical region: Secondary | ICD-10-CM | POA: Diagnosis not present

## 2023-11-03 DIAGNOSIS — G35 Multiple sclerosis: Secondary | ICD-10-CM | POA: Diagnosis present

## 2023-11-03 DIAGNOSIS — L089 Local infection of the skin and subcutaneous tissue, unspecified: Secondary | ICD-10-CM | POA: Diagnosis not present

## 2023-11-03 DIAGNOSIS — I700 Atherosclerosis of aorta: Secondary | ICD-10-CM | POA: Diagnosis present

## 2023-11-03 DIAGNOSIS — M5021 Other cervical disc displacement,  high cervical region: Secondary | ICD-10-CM | POA: Diagnosis not present

## 2023-11-03 DIAGNOSIS — L8994 Pressure ulcer of unspecified site, stage 4: Secondary | ICD-10-CM | POA: Diagnosis not present

## 2023-11-03 DIAGNOSIS — L89150 Pressure ulcer of sacral region, unstageable: Secondary | ICD-10-CM | POA: Diagnosis not present

## 2023-11-03 DIAGNOSIS — I509 Heart failure, unspecified: Secondary | ICD-10-CM | POA: Diagnosis present

## 2023-11-03 DIAGNOSIS — N1832 Chronic kidney disease, stage 3b: Secondary | ICD-10-CM | POA: Diagnosis not present

## 2023-11-03 DIAGNOSIS — R059 Cough, unspecified: Secondary | ICD-10-CM | POA: Diagnosis not present

## 2023-11-03 DIAGNOSIS — I420 Dilated cardiomyopathy: Secondary | ICD-10-CM | POA: Diagnosis present

## 2023-11-03 DIAGNOSIS — Z7982 Long term (current) use of aspirin: Secondary | ICD-10-CM | POA: Diagnosis not present

## 2023-11-03 DIAGNOSIS — I739 Peripheral vascular disease, unspecified: Secondary | ICD-10-CM | POA: Diagnosis present

## 2023-11-03 DIAGNOSIS — R54 Age-related physical debility: Secondary | ICD-10-CM | POA: Diagnosis present

## 2023-11-03 DIAGNOSIS — M869 Osteomyelitis, unspecified: Secondary | ICD-10-CM | POA: Diagnosis present

## 2023-11-03 DIAGNOSIS — L89154 Pressure ulcer of sacral region, stage 4: Secondary | ICD-10-CM | POA: Diagnosis present

## 2023-11-03 DIAGNOSIS — J440 Chronic obstructive pulmonary disease with acute lower respiratory infection: Secondary | ICD-10-CM | POA: Diagnosis present

## 2023-11-03 DIAGNOSIS — N390 Urinary tract infection, site not specified: Secondary | ICD-10-CM | POA: Diagnosis present

## 2023-11-03 DIAGNOSIS — S91001A Unspecified open wound, right ankle, initial encounter: Secondary | ICD-10-CM | POA: Diagnosis not present

## 2023-11-03 DIAGNOSIS — R531 Weakness: Secondary | ICD-10-CM | POA: Diagnosis not present

## 2023-11-03 DIAGNOSIS — M50222 Other cervical disc displacement at C5-C6 level: Secondary | ICD-10-CM | POA: Diagnosis not present

## 2023-11-03 DIAGNOSIS — A419 Sepsis, unspecified organism: Secondary | ICD-10-CM | POA: Diagnosis not present

## 2023-11-03 DIAGNOSIS — R7881 Bacteremia: Secondary | ICD-10-CM | POA: Diagnosis present

## 2023-11-03 DIAGNOSIS — Z792 Long term (current) use of antibiotics: Secondary | ICD-10-CM | POA: Diagnosis not present

## 2023-11-03 DIAGNOSIS — Z79890 Hormone replacement therapy (postmenopausal): Secondary | ICD-10-CM | POA: Diagnosis not present

## 2023-11-03 DIAGNOSIS — S91302A Unspecified open wound, left foot, initial encounter: Secondary | ICD-10-CM | POA: Diagnosis not present

## 2023-11-03 DIAGNOSIS — Z20822 Contact with and (suspected) exposure to covid-19: Secondary | ICD-10-CM | POA: Diagnosis not present

## 2023-11-03 DIAGNOSIS — J189 Pneumonia, unspecified organism: Secondary | ICD-10-CM | POA: Diagnosis present

## 2023-11-03 DIAGNOSIS — L89159 Pressure ulcer of sacral region, unspecified stage: Secondary | ICD-10-CM | POA: Diagnosis not present

## 2023-11-04 DIAGNOSIS — L089 Local infection of the skin and subcutaneous tissue, unspecified: Secondary | ICD-10-CM | POA: Diagnosis not present

## 2023-11-04 DIAGNOSIS — J189 Pneumonia, unspecified organism: Secondary | ICD-10-CM | POA: Diagnosis not present

## 2023-11-04 DIAGNOSIS — L8994 Pressure ulcer of unspecified site, stage 4: Secondary | ICD-10-CM | POA: Diagnosis not present

## 2023-11-04 DIAGNOSIS — N1832 Chronic kidney disease, stage 3b: Secondary | ICD-10-CM | POA: Diagnosis not present

## 2023-11-05 NOTE — LTC Provider Review (Signed)
 SPEECH THERAPY DISCHARGE SUMMARY Actual Discharge Date: 11/03/23   Patient Name:  Jordan Ward      Medical Record Number: [id number]  Date of Birth: 17-Mar-1941  Sex: Male       [facility] Physician: Orpha Haley  Missed Treatments: No   ASSESSMENT  Analysis of Functional Outcome/Clinical Impression: Pt was engaged and cooperative with skilled ST intervention. Pt exhibited cont health decline resulting in unplanned hospitalization.  Patient/Caregiver Training: ST educated pt on progress, POC and strategies.  Summary of Skilled Services Provided Since Start of Care: ST has assessed and treated cognition. ST has provided ongoing assessment of pts performance.    PLAN Patient to Discharge: to Hospital Recommendations: ST at hospital to address dysphagia as appropriate  SUBJECTIVE: Pt was engaged and cooperative  OBJECTIVE     GOALS Short Term Goals: Short Term Goal 1: Patient will exhibit funcational abstract reasoning with 60% of opportunities in order to facilitate safe return to prior level of living, promote independence, safely particapte  with activites of choice, facilitate safety within current environement.       Plan : Goal Discontinued   Short Term Goal 2: Patient will perform functional planning/thought organization tasks with 85% accuracy in order to facilitate safety within current environment, increase safety during daily living tasks, return to PLOF.      Plan : Goal Discontinued   Short Term Goal 3: Patient will exhibit good safety awareness during daily living tasks with 85% of opportunites in order to decrease risk for falls, increase ability to participate in ADLs, return to prior level of living, participate in functional acitivites of choice, safely return to home/community       Plan : Goal Discontinued   Short Term Goal 4: Patient will complete oral motor and laryngeal elevation/excursion   exercises given skilled instruction with mod assistance in order to facilitate safe and effective mastication, improve ROM and strength, to facilitate bolus control and cohesion, to improve oral musculature functioning to increase clearance of oral residue, to improve function of oral musculature during oral intake.       Plan : Goal Discontinued   Short Term Goal 5: Patient will increase ability to clear oral cavity of solid to mod as a result of increased sensation/awareness to bolus; in order to increase safe oral intake; using tongue sweep or finger sweep re-swallow strategy.       Plan : Goal Discontinued   Short Term Goal 6: Patient will demonstrate adequate ability to safely swallow thin and regular       Plan : Goal Discontinued   Long Term Goals: Long Term Goal #1: Patient will increase ability to function safely without additional assistance/supervision due to cognitive deficits to 80-95% of the time in order to demonstrate improved problem solving/cogntion skills.       Plan : Goal Discontinued   Long Term Goal #2: Patient will safely and effectively swallow thin and regular 95% of the time without s/sx of aspiration in order to acheive nutriation and hydration needs with min risks of aspiration.       Plan : Goal Discontinued       I attest that I have reviewed the above information. Signed: Ulanda Moats, SLP Fredricka 11/05/2023

## 2023-11-08 DIAGNOSIS — M47811 Spondylosis without myelopathy or radiculopathy, occipito-atlanto-axial region: Secondary | ICD-10-CM | POA: Diagnosis not present

## 2023-11-08 DIAGNOSIS — M4802 Spinal stenosis, cervical region: Secondary | ICD-10-CM | POA: Diagnosis not present

## 2023-11-08 DIAGNOSIS — M50222 Other cervical disc displacement at C5-C6 level: Secondary | ICD-10-CM | POA: Diagnosis not present

## 2023-11-08 DIAGNOSIS — M5021 Other cervical disc displacement,  high cervical region: Secondary | ICD-10-CM | POA: Diagnosis not present

## 2023-11-10 ENCOUNTER — Other Ambulatory Visit (HOSPITAL_COMMUNITY): Payer: Self-pay | Admitting: Radiology

## 2023-11-10 ENCOUNTER — Other Ambulatory Visit (HOSPITAL_COMMUNITY): Payer: Medicare Other

## 2023-11-10 DIAGNOSIS — L89626 Pressure-induced deep tissue damage of left heel: Secondary | ICD-10-CM | POA: Diagnosis present

## 2023-11-10 DIAGNOSIS — E46 Unspecified protein-calorie malnutrition: Secondary | ICD-10-CM | POA: Diagnosis not present

## 2023-11-10 DIAGNOSIS — N319 Neuromuscular dysfunction of bladder, unspecified: Secondary | ICD-10-CM

## 2023-11-10 DIAGNOSIS — L89159 Pressure ulcer of sacral region, unspecified stage: Secondary | ICD-10-CM | POA: Diagnosis not present

## 2023-11-10 DIAGNOSIS — L89520 Pressure ulcer of left ankle, unstageable: Secondary | ICD-10-CM | POA: Diagnosis not present

## 2023-11-10 DIAGNOSIS — B952 Enterococcus as the cause of diseases classified elsewhere: Secondary | ICD-10-CM | POA: Diagnosis not present

## 2023-11-10 DIAGNOSIS — J439 Emphysema, unspecified: Secondary | ICD-10-CM | POA: Diagnosis present

## 2023-11-10 DIAGNOSIS — L89510 Pressure ulcer of right ankle, unstageable: Secondary | ICD-10-CM | POA: Diagnosis present

## 2023-11-10 DIAGNOSIS — N390 Urinary tract infection, site not specified: Secondary | ICD-10-CM | POA: Diagnosis not present

## 2023-11-10 DIAGNOSIS — R1310 Dysphagia, unspecified: Secondary | ICD-10-CM | POA: Diagnosis present

## 2023-11-10 DIAGNOSIS — K5900 Constipation, unspecified: Secondary | ICD-10-CM | POA: Diagnosis present

## 2023-11-10 DIAGNOSIS — R5381 Other malaise: Secondary | ICD-10-CM | POA: Diagnosis not present

## 2023-11-10 DIAGNOSIS — J189 Pneumonia, unspecified organism: Secondary | ICD-10-CM | POA: Diagnosis not present

## 2023-11-10 DIAGNOSIS — I10 Essential (primary) hypertension: Secondary | ICD-10-CM | POA: Diagnosis present

## 2023-11-10 DIAGNOSIS — R1312 Dysphagia, oropharyngeal phase: Secondary | ICD-10-CM | POA: Diagnosis not present

## 2023-11-10 DIAGNOSIS — E039 Hypothyroidism, unspecified: Secondary | ICD-10-CM | POA: Diagnosis not present

## 2023-11-10 DIAGNOSIS — Z9359 Other cystostomy status: Secondary | ICD-10-CM | POA: Diagnosis not present

## 2023-11-10 DIAGNOSIS — J440 Chronic obstructive pulmonary disease with acute lower respiratory infection: Secondary | ICD-10-CM | POA: Diagnosis not present

## 2023-11-10 DIAGNOSIS — G35 Multiple sclerosis: Secondary | ICD-10-CM | POA: Diagnosis not present

## 2023-11-10 DIAGNOSIS — M5459 Other low back pain: Secondary | ICD-10-CM | POA: Diagnosis not present

## 2023-11-10 DIAGNOSIS — I679 Cerebrovascular disease, unspecified: Secondary | ICD-10-CM | POA: Diagnosis present

## 2023-11-10 DIAGNOSIS — R110 Nausea: Secondary | ICD-10-CM | POA: Diagnosis present

## 2023-11-10 DIAGNOSIS — G7281 Critical illness myopathy: Secondary | ICD-10-CM | POA: Diagnosis not present

## 2023-11-10 DIAGNOSIS — N184 Chronic kidney disease, stage 4 (severe): Secondary | ICD-10-CM | POA: Diagnosis present

## 2023-11-10 DIAGNOSIS — E876 Hypokalemia: Secondary | ICD-10-CM | POA: Diagnosis present

## 2023-11-10 DIAGNOSIS — L89216 Pressure-induced deep tissue damage of right hip: Secondary | ICD-10-CM | POA: Diagnosis not present

## 2023-11-10 DIAGNOSIS — J8410 Pulmonary fibrosis, unspecified: Secondary | ICD-10-CM | POA: Diagnosis present

## 2023-11-10 DIAGNOSIS — F411 Generalized anxiety disorder: Secondary | ICD-10-CM | POA: Diagnosis not present

## 2023-11-10 DIAGNOSIS — N1832 Chronic kidney disease, stage 3b: Secondary | ICD-10-CM | POA: Diagnosis not present

## 2023-11-10 DIAGNOSIS — E785 Hyperlipidemia, unspecified: Secondary | ICD-10-CM | POA: Diagnosis present

## 2023-11-10 DIAGNOSIS — M4628 Osteomyelitis of vertebra, sacral and sacrococcygeal region: Secondary | ICD-10-CM | POA: Diagnosis not present

## 2023-11-10 DIAGNOSIS — R0989 Other specified symptoms and signs involving the circulatory and respiratory systems: Secondary | ICD-10-CM | POA: Diagnosis not present

## 2023-11-10 DIAGNOSIS — F419 Anxiety disorder, unspecified: Secondary | ICD-10-CM | POA: Diagnosis not present

## 2023-11-10 DIAGNOSIS — R040 Epistaxis: Secondary | ICD-10-CM | POA: Diagnosis not present

## 2023-11-10 DIAGNOSIS — L89610 Pressure ulcer of right heel, unstageable: Secondary | ICD-10-CM | POA: Diagnosis not present

## 2023-11-10 DIAGNOSIS — R531 Weakness: Secondary | ICD-10-CM | POA: Diagnosis not present

## 2023-11-10 DIAGNOSIS — J9611 Chronic respiratory failure with hypoxia: Secondary | ICD-10-CM | POA: Diagnosis not present

## 2023-11-10 DIAGNOSIS — Z7401 Bed confinement status: Secondary | ICD-10-CM | POA: Diagnosis not present

## 2023-11-10 DIAGNOSIS — I050 Rheumatic mitral stenosis: Secondary | ICD-10-CM | POA: Diagnosis not present

## 2023-11-10 DIAGNOSIS — R52 Pain, unspecified: Secondary | ICD-10-CM | POA: Diagnosis present

## 2023-11-10 DIAGNOSIS — J449 Chronic obstructive pulmonary disease, unspecified: Secondary | ICD-10-CM | POA: Diagnosis present

## 2023-11-10 DIAGNOSIS — I639 Cerebral infarction, unspecified: Secondary | ICD-10-CM | POA: Diagnosis present

## 2023-11-10 DIAGNOSIS — N261 Atrophy of kidney (terminal): Secondary | ICD-10-CM | POA: Diagnosis present

## 2023-11-10 DIAGNOSIS — Z960 Presence of urogenital implants: Secondary | ICD-10-CM | POA: Diagnosis present

## 2023-11-10 DIAGNOSIS — L89620 Pressure ulcer of left heel, unstageable: Secondary | ICD-10-CM | POA: Diagnosis not present

## 2023-11-10 DIAGNOSIS — R262 Difficulty in walking, not elsewhere classified: Secondary | ICD-10-CM | POA: Diagnosis not present

## 2023-11-10 DIAGNOSIS — F331 Major depressive disorder, recurrent, moderate: Secondary | ICD-10-CM | POA: Diagnosis not present

## 2023-11-10 DIAGNOSIS — E440 Moderate protein-calorie malnutrition: Secondary | ICD-10-CM | POA: Diagnosis present

## 2023-11-10 DIAGNOSIS — G894 Chronic pain syndrome: Secondary | ICD-10-CM | POA: Diagnosis not present

## 2023-11-10 DIAGNOSIS — L89154 Pressure ulcer of sacral region, stage 4: Secondary | ICD-10-CM | POA: Diagnosis not present

## 2023-11-10 DIAGNOSIS — Z436 Encounter for attention to other artificial openings of urinary tract: Secondary | ICD-10-CM | POA: Diagnosis not present

## 2023-11-10 DIAGNOSIS — B965 Pseudomonas (aeruginosa) (mallei) (pseudomallei) as the cause of diseases classified elsewhere: Secondary | ICD-10-CM | POA: Diagnosis not present

## 2023-11-10 DIAGNOSIS — M869 Osteomyelitis, unspecified: Secondary | ICD-10-CM | POA: Diagnosis not present

## 2023-11-10 DIAGNOSIS — N312 Flaccid neuropathic bladder, not elsewhere classified: Secondary | ICD-10-CM | POA: Diagnosis present

## 2023-11-10 DIAGNOSIS — J9621 Acute and chronic respiratory failure with hypoxia: Secondary | ICD-10-CM | POA: Diagnosis not present

## 2023-11-10 DIAGNOSIS — R4181 Age-related cognitive decline: Secondary | ICD-10-CM | POA: Diagnosis not present

## 2023-11-10 DIAGNOSIS — L89226 Pressure-induced deep tissue damage of left hip: Secondary | ICD-10-CM | POA: Diagnosis not present

## 2023-11-10 DIAGNOSIS — L89609 Pressure ulcer of unspecified heel, unspecified stage: Secondary | ICD-10-CM | POA: Diagnosis present

## 2023-11-10 DIAGNOSIS — R159 Full incontinence of feces: Secondary | ICD-10-CM | POA: Diagnosis not present

## 2023-11-11 DIAGNOSIS — N1832 Chronic kidney disease, stage 3b: Secondary | ICD-10-CM | POA: Diagnosis not present

## 2023-11-11 DIAGNOSIS — E039 Hypothyroidism, unspecified: Secondary | ICD-10-CM | POA: Diagnosis not present

## 2023-11-11 DIAGNOSIS — L89154 Pressure ulcer of sacral region, stage 4: Secondary | ICD-10-CM | POA: Diagnosis not present

## 2023-11-11 DIAGNOSIS — G35 Multiple sclerosis: Secondary | ICD-10-CM | POA: Diagnosis not present

## 2023-11-12 ENCOUNTER — Other Ambulatory Visit: Payer: Medicare Other

## 2023-11-12 DIAGNOSIS — E039 Hypothyroidism, unspecified: Secondary | ICD-10-CM | POA: Diagnosis not present

## 2023-11-12 DIAGNOSIS — N1832 Chronic kidney disease, stage 3b: Secondary | ICD-10-CM | POA: Diagnosis not present

## 2023-11-12 DIAGNOSIS — L89154 Pressure ulcer of sacral region, stage 4: Secondary | ICD-10-CM | POA: Diagnosis not present

## 2023-11-12 DIAGNOSIS — G35 Multiple sclerosis: Secondary | ICD-10-CM | POA: Diagnosis not present

## 2023-11-13 ENCOUNTER — Other Ambulatory Visit: Payer: Medicare Other

## 2023-11-13 DIAGNOSIS — G35 Multiple sclerosis: Secondary | ICD-10-CM | POA: Diagnosis not present

## 2023-11-13 DIAGNOSIS — E039 Hypothyroidism, unspecified: Secondary | ICD-10-CM | POA: Diagnosis not present

## 2023-11-13 DIAGNOSIS — L89154 Pressure ulcer of sacral region, stage 4: Secondary | ICD-10-CM | POA: Diagnosis not present

## 2023-11-13 DIAGNOSIS — N1832 Chronic kidney disease, stage 3b: Secondary | ICD-10-CM | POA: Diagnosis not present

## 2023-11-14 DIAGNOSIS — J9621 Acute and chronic respiratory failure with hypoxia: Secondary | ICD-10-CM

## 2023-11-14 DIAGNOSIS — G35 Multiple sclerosis: Secondary | ICD-10-CM

## 2023-11-14 DIAGNOSIS — L89154 Pressure ulcer of sacral region, stage 4: Secondary | ICD-10-CM

## 2023-11-14 DIAGNOSIS — E039 Hypothyroidism, unspecified: Secondary | ICD-10-CM | POA: Diagnosis not present

## 2023-11-14 DIAGNOSIS — N1832 Chronic kidney disease, stage 3b: Secondary | ICD-10-CM | POA: Diagnosis not present

## 2023-11-15 DIAGNOSIS — E039 Hypothyroidism, unspecified: Secondary | ICD-10-CM | POA: Diagnosis not present

## 2023-11-15 DIAGNOSIS — G35 Multiple sclerosis: Secondary | ICD-10-CM | POA: Diagnosis not present

## 2023-11-15 DIAGNOSIS — L89154 Pressure ulcer of sacral region, stage 4: Secondary | ICD-10-CM | POA: Diagnosis not present

## 2023-11-15 DIAGNOSIS — N1832 Chronic kidney disease, stage 3b: Secondary | ICD-10-CM | POA: Diagnosis not present

## 2023-11-16 DIAGNOSIS — J9621 Acute and chronic respiratory failure with hypoxia: Secondary | ICD-10-CM

## 2023-11-16 DIAGNOSIS — L89154 Pressure ulcer of sacral region, stage 4: Secondary | ICD-10-CM | POA: Diagnosis not present

## 2023-11-16 DIAGNOSIS — N1832 Chronic kidney disease, stage 3b: Secondary | ICD-10-CM | POA: Diagnosis not present

## 2023-11-16 DIAGNOSIS — E039 Hypothyroidism, unspecified: Secondary | ICD-10-CM | POA: Diagnosis not present

## 2023-11-16 DIAGNOSIS — G35 Multiple sclerosis: Secondary | ICD-10-CM | POA: Diagnosis not present

## 2023-11-17 ENCOUNTER — Ambulatory Visit: Payer: Medicare Other

## 2023-11-17 DIAGNOSIS — G35 Multiple sclerosis: Secondary | ICD-10-CM | POA: Diagnosis not present

## 2023-11-17 DIAGNOSIS — N1832 Chronic kidney disease, stage 3b: Secondary | ICD-10-CM | POA: Diagnosis not present

## 2023-11-17 DIAGNOSIS — E039 Hypothyroidism, unspecified: Secondary | ICD-10-CM | POA: Diagnosis not present

## 2023-11-17 DIAGNOSIS — L89154 Pressure ulcer of sacral region, stage 4: Secondary | ICD-10-CM | POA: Diagnosis not present

## 2023-11-18 DIAGNOSIS — N1832 Chronic kidney disease, stage 3b: Secondary | ICD-10-CM | POA: Diagnosis not present

## 2023-11-18 DIAGNOSIS — E039 Hypothyroidism, unspecified: Secondary | ICD-10-CM | POA: Diagnosis not present

## 2023-11-18 DIAGNOSIS — G35 Multiple sclerosis: Secondary | ICD-10-CM | POA: Diagnosis not present

## 2023-11-18 DIAGNOSIS — L89154 Pressure ulcer of sacral region, stage 4: Secondary | ICD-10-CM | POA: Diagnosis not present

## 2023-11-19 DIAGNOSIS — E039 Hypothyroidism, unspecified: Secondary | ICD-10-CM | POA: Diagnosis not present

## 2023-11-19 DIAGNOSIS — N1832 Chronic kidney disease, stage 3b: Secondary | ICD-10-CM | POA: Diagnosis not present

## 2023-11-19 DIAGNOSIS — L89154 Pressure ulcer of sacral region, stage 4: Secondary | ICD-10-CM | POA: Diagnosis not present

## 2023-11-19 DIAGNOSIS — G35 Multiple sclerosis: Secondary | ICD-10-CM | POA: Diagnosis not present

## 2023-11-20 DIAGNOSIS — L89154 Pressure ulcer of sacral region, stage 4: Secondary | ICD-10-CM | POA: Diagnosis not present

## 2023-11-20 DIAGNOSIS — E039 Hypothyroidism, unspecified: Secondary | ICD-10-CM | POA: Diagnosis not present

## 2023-11-20 DIAGNOSIS — G35 Multiple sclerosis: Secondary | ICD-10-CM | POA: Diagnosis not present

## 2023-11-20 DIAGNOSIS — N1832 Chronic kidney disease, stage 3b: Secondary | ICD-10-CM | POA: Diagnosis not present

## 2023-11-21 DIAGNOSIS — E039 Hypothyroidism, unspecified: Secondary | ICD-10-CM | POA: Diagnosis not present

## 2023-11-21 DIAGNOSIS — L89154 Pressure ulcer of sacral region, stage 4: Secondary | ICD-10-CM | POA: Diagnosis not present

## 2023-11-21 DIAGNOSIS — N1832 Chronic kidney disease, stage 3b: Secondary | ICD-10-CM | POA: Diagnosis not present

## 2023-11-21 DIAGNOSIS — G35 Multiple sclerosis: Secondary | ICD-10-CM | POA: Diagnosis not present

## 2023-11-22 DIAGNOSIS — N1832 Chronic kidney disease, stage 3b: Secondary | ICD-10-CM | POA: Diagnosis not present

## 2023-11-22 DIAGNOSIS — E039 Hypothyroidism, unspecified: Secondary | ICD-10-CM | POA: Diagnosis not present

## 2023-11-22 DIAGNOSIS — G35 Multiple sclerosis: Secondary | ICD-10-CM | POA: Diagnosis not present

## 2023-11-22 DIAGNOSIS — L89154 Pressure ulcer of sacral region, stage 4: Secondary | ICD-10-CM | POA: Diagnosis not present

## 2023-11-23 DIAGNOSIS — E039 Hypothyroidism, unspecified: Secondary | ICD-10-CM | POA: Diagnosis not present

## 2023-11-23 DIAGNOSIS — G35 Multiple sclerosis: Secondary | ICD-10-CM

## 2023-11-23 DIAGNOSIS — J9621 Acute and chronic respiratory failure with hypoxia: Secondary | ICD-10-CM

## 2023-11-23 DIAGNOSIS — L89154 Pressure ulcer of sacral region, stage 4: Secondary | ICD-10-CM

## 2023-11-23 DIAGNOSIS — N1832 Chronic kidney disease, stage 3b: Secondary | ICD-10-CM | POA: Diagnosis not present

## 2023-11-24 DIAGNOSIS — L89154 Pressure ulcer of sacral region, stage 4: Secondary | ICD-10-CM | POA: Diagnosis not present

## 2023-11-24 DIAGNOSIS — G35 Multiple sclerosis: Secondary | ICD-10-CM | POA: Diagnosis not present

## 2023-11-24 DIAGNOSIS — E039 Hypothyroidism, unspecified: Secondary | ICD-10-CM | POA: Diagnosis not present

## 2023-11-24 DIAGNOSIS — N1832 Chronic kidney disease, stage 3b: Secondary | ICD-10-CM | POA: Diagnosis not present

## 2023-11-25 DIAGNOSIS — G35 Multiple sclerosis: Secondary | ICD-10-CM | POA: Diagnosis not present

## 2023-11-25 DIAGNOSIS — L89154 Pressure ulcer of sacral region, stage 4: Secondary | ICD-10-CM | POA: Diagnosis not present

## 2023-11-25 DIAGNOSIS — N1832 Chronic kidney disease, stage 3b: Secondary | ICD-10-CM | POA: Diagnosis not present

## 2023-11-25 DIAGNOSIS — E039 Hypothyroidism, unspecified: Secondary | ICD-10-CM | POA: Diagnosis not present

## 2023-11-26 DIAGNOSIS — L89154 Pressure ulcer of sacral region, stage 4: Secondary | ICD-10-CM | POA: Diagnosis not present

## 2023-11-26 DIAGNOSIS — N1832 Chronic kidney disease, stage 3b: Secondary | ICD-10-CM | POA: Diagnosis not present

## 2023-11-26 DIAGNOSIS — E039 Hypothyroidism, unspecified: Secondary | ICD-10-CM | POA: Diagnosis not present

## 2023-11-26 DIAGNOSIS — G35 Multiple sclerosis: Secondary | ICD-10-CM | POA: Diagnosis not present

## 2023-11-27 DIAGNOSIS — E039 Hypothyroidism, unspecified: Secondary | ICD-10-CM | POA: Diagnosis not present

## 2023-11-27 DIAGNOSIS — G35 Multiple sclerosis: Secondary | ICD-10-CM | POA: Diagnosis not present

## 2023-11-27 DIAGNOSIS — N1832 Chronic kidney disease, stage 3b: Secondary | ICD-10-CM | POA: Diagnosis not present

## 2023-11-27 DIAGNOSIS — L89154 Pressure ulcer of sacral region, stage 4: Secondary | ICD-10-CM | POA: Diagnosis not present

## 2023-11-28 DIAGNOSIS — E039 Hypothyroidism, unspecified: Secondary | ICD-10-CM | POA: Diagnosis not present

## 2023-11-28 DIAGNOSIS — N1832 Chronic kidney disease, stage 3b: Secondary | ICD-10-CM | POA: Diagnosis not present

## 2023-11-28 DIAGNOSIS — J9621 Acute and chronic respiratory failure with hypoxia: Secondary | ICD-10-CM

## 2023-11-28 DIAGNOSIS — L89154 Pressure ulcer of sacral region, stage 4: Secondary | ICD-10-CM | POA: Diagnosis not present

## 2023-11-28 DIAGNOSIS — G35 Multiple sclerosis: Secondary | ICD-10-CM | POA: Diagnosis not present

## 2023-11-29 DIAGNOSIS — G35 Multiple sclerosis: Secondary | ICD-10-CM | POA: Diagnosis not present

## 2023-11-29 DIAGNOSIS — E039 Hypothyroidism, unspecified: Secondary | ICD-10-CM | POA: Diagnosis not present

## 2023-11-29 DIAGNOSIS — N1832 Chronic kidney disease, stage 3b: Secondary | ICD-10-CM | POA: Diagnosis not present

## 2023-11-29 DIAGNOSIS — L89154 Pressure ulcer of sacral region, stage 4: Secondary | ICD-10-CM | POA: Diagnosis not present

## 2023-11-30 DIAGNOSIS — L89154 Pressure ulcer of sacral region, stage 4: Secondary | ICD-10-CM | POA: Diagnosis not present

## 2023-11-30 DIAGNOSIS — E039 Hypothyroidism, unspecified: Secondary | ICD-10-CM | POA: Diagnosis not present

## 2023-11-30 DIAGNOSIS — J9621 Acute and chronic respiratory failure with hypoxia: Secondary | ICD-10-CM | POA: Diagnosis not present

## 2023-11-30 DIAGNOSIS — G35 Multiple sclerosis: Secondary | ICD-10-CM

## 2023-11-30 DIAGNOSIS — N1832 Chronic kidney disease, stage 3b: Secondary | ICD-10-CM | POA: Diagnosis not present

## 2023-12-01 DIAGNOSIS — N1832 Chronic kidney disease, stage 3b: Secondary | ICD-10-CM | POA: Diagnosis not present

## 2023-12-01 DIAGNOSIS — L89154 Pressure ulcer of sacral region, stage 4: Secondary | ICD-10-CM | POA: Diagnosis not present

## 2023-12-01 DIAGNOSIS — E039 Hypothyroidism, unspecified: Secondary | ICD-10-CM | POA: Diagnosis not present

## 2023-12-01 DIAGNOSIS — J9621 Acute and chronic respiratory failure with hypoxia: Secondary | ICD-10-CM | POA: Diagnosis not present

## 2023-12-02 ENCOUNTER — Telehealth: Payer: Self-pay | Admitting: Neurology

## 2023-12-02 DIAGNOSIS — N1832 Chronic kidney disease, stage 3b: Secondary | ICD-10-CM | POA: Diagnosis not present

## 2023-12-02 DIAGNOSIS — E039 Hypothyroidism, unspecified: Secondary | ICD-10-CM | POA: Diagnosis not present

## 2023-12-02 DIAGNOSIS — J9621 Acute and chronic respiratory failure with hypoxia: Secondary | ICD-10-CM | POA: Diagnosis not present

## 2023-12-02 DIAGNOSIS — L89154 Pressure ulcer of sacral region, stage 4: Secondary | ICD-10-CM | POA: Diagnosis not present

## 2023-12-02 NOTE — Telephone Encounter (Signed)
 Wife is asking for a call to discuss pt being in [facility], she states pt is unable to walk, and being told that MS may be back in pt's leg, wife asking to discuss a MRI with Sarah,NP please call wife.

## 2023-12-02 NOTE — Telephone Encounter (Signed)
 I contacted pt wife back, she stated MRI was completed with [facility]. Images are available in care everywhere. Advised I will have MD review and advise. She verbally understood.

## 2023-12-03 DIAGNOSIS — L89154 Pressure ulcer of sacral region, stage 4: Secondary | ICD-10-CM | POA: Diagnosis not present

## 2023-12-03 DIAGNOSIS — J9621 Acute and chronic respiratory failure with hypoxia: Secondary | ICD-10-CM | POA: Diagnosis not present

## 2023-12-03 DIAGNOSIS — N1832 Chronic kidney disease, stage 3b: Secondary | ICD-10-CM | POA: Diagnosis not present

## 2023-12-03 DIAGNOSIS — E039 Hypothyroidism, unspecified: Secondary | ICD-10-CM | POA: Diagnosis not present

## 2023-12-04 DIAGNOSIS — N1832 Chronic kidney disease, stage 3b: Secondary | ICD-10-CM | POA: Diagnosis not present

## 2023-12-04 DIAGNOSIS — J9621 Acute and chronic respiratory failure with hypoxia: Secondary | ICD-10-CM | POA: Diagnosis not present

## 2023-12-04 DIAGNOSIS — L89154 Pressure ulcer of sacral region, stage 4: Secondary | ICD-10-CM | POA: Diagnosis not present

## 2023-12-04 DIAGNOSIS — E039 Hypothyroidism, unspecified: Secondary | ICD-10-CM | POA: Diagnosis not present

## 2023-12-05 DIAGNOSIS — E039 Hypothyroidism, unspecified: Secondary | ICD-10-CM | POA: Diagnosis not present

## 2023-12-05 DIAGNOSIS — J9621 Acute and chronic respiratory failure with hypoxia: Secondary | ICD-10-CM | POA: Diagnosis not present

## 2023-12-05 DIAGNOSIS — L89154 Pressure ulcer of sacral region, stage 4: Secondary | ICD-10-CM | POA: Diagnosis not present

## 2023-12-05 DIAGNOSIS — N1832 Chronic kidney disease, stage 3b: Secondary | ICD-10-CM | POA: Diagnosis not present

## 2023-12-06 DIAGNOSIS — E039 Hypothyroidism, unspecified: Secondary | ICD-10-CM | POA: Diagnosis not present

## 2023-12-06 DIAGNOSIS — J9621 Acute and chronic respiratory failure with hypoxia: Secondary | ICD-10-CM | POA: Diagnosis not present

## 2023-12-06 DIAGNOSIS — L89154 Pressure ulcer of sacral region, stage 4: Secondary | ICD-10-CM | POA: Diagnosis not present

## 2023-12-06 DIAGNOSIS — N1832 Chronic kidney disease, stage 3b: Secondary | ICD-10-CM | POA: Diagnosis not present

## 2023-12-07 ENCOUNTER — Other Ambulatory Visit: Payer: Self-pay | Admitting: Neurology

## 2023-12-07 DIAGNOSIS — L89154 Pressure ulcer of sacral region, stage 4: Secondary | ICD-10-CM | POA: Diagnosis not present

## 2023-12-07 DIAGNOSIS — M4802 Spinal stenosis, cervical region: Secondary | ICD-10-CM

## 2023-12-07 DIAGNOSIS — G35 Multiple sclerosis: Secondary | ICD-10-CM

## 2023-12-07 DIAGNOSIS — J9621 Acute and chronic respiratory failure with hypoxia: Secondary | ICD-10-CM | POA: Diagnosis not present

## 2023-12-07 DIAGNOSIS — E039 Hypothyroidism, unspecified: Secondary | ICD-10-CM | POA: Diagnosis not present

## 2023-12-07 DIAGNOSIS — N1832 Chronic kidney disease, stage 3b: Secondary | ICD-10-CM | POA: Diagnosis not present

## 2023-12-07 NOTE — Telephone Encounter (Signed)
 Called the wife back. I advised that Dr Pearlean Brownie was unable to see the images to get a good look but upon reviewing the impression sounds like there is significant changes in cervical and lumbar spine area. I advised of the findings. She was curious about the whether this was MS. I advised that Dr Pearlean Brownie didn't indicate any concerns for new lesions again basing off the impression report but the spinal stenosis that was present likely could be cause for his walking problems. Dr Pearlean Brownie would recommend a NS referral to assess. Order was placed. [facility] at [facility] time.

## 2023-12-08 ENCOUNTER — Telehealth: Payer: Self-pay | Admitting: Neurology

## 2023-12-08 DIAGNOSIS — N1330 Unspecified hydronephrosis: Secondary | ICD-10-CM | POA: Diagnosis not present

## 2023-12-08 DIAGNOSIS — R109 Unspecified abdominal pain: Secondary | ICD-10-CM | POA: Diagnosis not present

## 2023-12-08 DIAGNOSIS — L89609 Pressure ulcer of unspecified heel, unspecified stage: Secondary | ICD-10-CM | POA: Diagnosis present

## 2023-12-08 DIAGNOSIS — R918 Other nonspecific abnormal finding of lung field: Secondary | ICD-10-CM | POA: Diagnosis not present

## 2023-12-08 DIAGNOSIS — N189 Chronic kidney disease, unspecified: Secondary | ICD-10-CM | POA: Diagnosis not present

## 2023-12-08 DIAGNOSIS — Z960 Presence of urogenital implants: Secondary | ICD-10-CM | POA: Diagnosis present

## 2023-12-08 DIAGNOSIS — R509 Fever, unspecified: Secondary | ICD-10-CM | POA: Diagnosis not present

## 2023-12-08 DIAGNOSIS — L8994 Pressure ulcer of unspecified site, stage 4: Secondary | ICD-10-CM | POA: Diagnosis not present

## 2023-12-08 DIAGNOSIS — J440 Chronic obstructive pulmonary disease with acute lower respiratory infection: Secondary | ICD-10-CM | POA: Diagnosis not present

## 2023-12-08 DIAGNOSIS — R000 Tachycardia, unspecified: Secondary | ICD-10-CM | POA: Diagnosis not present

## 2023-12-08 DIAGNOSIS — R112 Nausea with vomiting, unspecified: Secondary | ICD-10-CM | POA: Diagnosis not present

## 2023-12-08 DIAGNOSIS — N179 Acute kidney failure, unspecified: Secondary | ICD-10-CM | POA: Diagnosis not present

## 2023-12-08 DIAGNOSIS — Y846 Urinary catheterization as the cause of abnormal reaction of the patient, or of later complication, without mention of misadventure at the time of the procedure: Secondary | ICD-10-CM | POA: Diagnosis present

## 2023-12-08 DIAGNOSIS — L89310 Pressure ulcer of right buttock, unstageable: Secondary | ICD-10-CM | POA: Diagnosis not present

## 2023-12-08 DIAGNOSIS — Z435 Encounter for attention to cystostomy: Secondary | ICD-10-CM | POA: Diagnosis not present

## 2023-12-08 DIAGNOSIS — L89620 Pressure ulcer of left heel, unstageable: Secondary | ICD-10-CM | POA: Diagnosis not present

## 2023-12-08 DIAGNOSIS — I129 Hypertensive chronic kidney disease with stage 1 through stage 4 chronic kidney disease, or unspecified chronic kidney disease: Secondary | ICD-10-CM | POA: Diagnosis not present

## 2023-12-08 DIAGNOSIS — N319 Neuromuscular dysfunction of bladder, unspecified: Secondary | ICD-10-CM | POA: Diagnosis not present

## 2023-12-08 DIAGNOSIS — S71009A Unspecified open wound, unspecified hip, initial encounter: Secondary | ICD-10-CM | POA: Diagnosis not present

## 2023-12-08 DIAGNOSIS — R52 Pain, unspecified: Secondary | ICD-10-CM | POA: Diagnosis present

## 2023-12-08 DIAGNOSIS — L89510 Pressure ulcer of right ankle, unstageable: Secondary | ICD-10-CM | POA: Diagnosis present

## 2023-12-08 DIAGNOSIS — K5900 Constipation, unspecified: Secondary | ICD-10-CM | POA: Diagnosis present

## 2023-12-08 DIAGNOSIS — J189 Pneumonia, unspecified organism: Secondary | ICD-10-CM | POA: Diagnosis not present

## 2023-12-08 DIAGNOSIS — F419 Anxiety disorder, unspecified: Secondary | ICD-10-CM | POA: Diagnosis not present

## 2023-12-08 DIAGNOSIS — J439 Emphysema, unspecified: Secondary | ICD-10-CM | POA: Diagnosis present

## 2023-12-08 DIAGNOSIS — A4151 Sepsis due to Escherichia coli [E. coli]: Secondary | ICD-10-CM | POA: Diagnosis not present

## 2023-12-08 DIAGNOSIS — J9621 Acute and chronic respiratory failure with hypoxia: Secondary | ICD-10-CM | POA: Diagnosis not present

## 2023-12-08 DIAGNOSIS — I739 Peripheral vascular disease, unspecified: Secondary | ICD-10-CM | POA: Diagnosis not present

## 2023-12-08 DIAGNOSIS — G35 Multiple sclerosis: Secondary | ICD-10-CM | POA: Diagnosis not present

## 2023-12-08 DIAGNOSIS — E440 Moderate protein-calorie malnutrition: Secondary | ICD-10-CM | POA: Diagnosis not present

## 2023-12-08 DIAGNOSIS — A419 Sepsis, unspecified organism: Secondary | ICD-10-CM | POA: Diagnosis not present

## 2023-12-08 DIAGNOSIS — M4802 Spinal stenosis, cervical region: Secondary | ICD-10-CM | POA: Diagnosis not present

## 2023-12-08 DIAGNOSIS — N139 Obstructive and reflux uropathy, unspecified: Secondary | ICD-10-CM | POA: Diagnosis not present

## 2023-12-08 DIAGNOSIS — E785 Hyperlipidemia, unspecified: Secondary | ICD-10-CM | POA: Diagnosis not present

## 2023-12-08 DIAGNOSIS — I10 Essential (primary) hypertension: Secondary | ICD-10-CM | POA: Diagnosis not present

## 2023-12-08 DIAGNOSIS — I6310 Cerebral infarction due to embolism of unspecified precerebral artery: Secondary | ICD-10-CM | POA: Diagnosis not present

## 2023-12-08 DIAGNOSIS — I679 Cerebrovascular disease, unspecified: Secondary | ICD-10-CM | POA: Diagnosis not present

## 2023-12-08 DIAGNOSIS — Z681 Body mass index (BMI) 19 or less, adult: Secondary | ICD-10-CM | POA: Diagnosis not present

## 2023-12-08 DIAGNOSIS — L89896 Pressure-induced deep tissue damage of other site: Secondary | ICD-10-CM | POA: Diagnosis not present

## 2023-12-08 DIAGNOSIS — M21371 Foot drop, right foot: Secondary | ICD-10-CM | POA: Diagnosis not present

## 2023-12-08 DIAGNOSIS — J449 Chronic obstructive pulmonary disease, unspecified: Secondary | ICD-10-CM | POA: Diagnosis present

## 2023-12-08 DIAGNOSIS — F4322 Adjustment disorder with anxiety: Secondary | ICD-10-CM | POA: Diagnosis not present

## 2023-12-08 DIAGNOSIS — E039 Hypothyroidism, unspecified: Secondary | ICD-10-CM | POA: Diagnosis not present

## 2023-12-08 DIAGNOSIS — M4628 Osteomyelitis of vertebra, sacral and sacrococcygeal region: Secondary | ICD-10-CM | POA: Diagnosis present

## 2023-12-08 DIAGNOSIS — E876 Hypokalemia: Secondary | ICD-10-CM | POA: Diagnosis present

## 2023-12-08 DIAGNOSIS — R627 Adult failure to thrive: Secondary | ICD-10-CM | POA: Diagnosis not present

## 2023-12-08 DIAGNOSIS — Z515 Encounter for palliative care: Secondary | ICD-10-CM | POA: Diagnosis not present

## 2023-12-08 DIAGNOSIS — F411 Generalized anxiety disorder: Secondary | ICD-10-CM | POA: Diagnosis not present

## 2023-12-08 DIAGNOSIS — G894 Chronic pain syndrome: Secondary | ICD-10-CM | POA: Diagnosis not present

## 2023-12-08 DIAGNOSIS — N1832 Chronic kidney disease, stage 3b: Secondary | ICD-10-CM | POA: Diagnosis not present

## 2023-12-08 DIAGNOSIS — M6281 Muscle weakness (generalized): Secondary | ICD-10-CM | POA: Diagnosis not present

## 2023-12-08 DIAGNOSIS — L89626 Pressure-induced deep tissue damage of left heel: Secondary | ICD-10-CM | POA: Diagnosis present

## 2023-12-08 DIAGNOSIS — F5102 Adjustment insomnia: Secondary | ICD-10-CM | POA: Diagnosis not present

## 2023-12-08 DIAGNOSIS — M5416 Radiculopathy, lumbar region: Secondary | ICD-10-CM | POA: Diagnosis not present

## 2023-12-08 DIAGNOSIS — R531 Weakness: Secondary | ICD-10-CM | POA: Diagnosis not present

## 2023-12-08 DIAGNOSIS — K219 Gastro-esophageal reflux disease without esophagitis: Secondary | ICD-10-CM | POA: Diagnosis not present

## 2023-12-08 DIAGNOSIS — D631 Anemia in chronic kidney disease: Secondary | ICD-10-CM | POA: Diagnosis not present

## 2023-12-08 DIAGNOSIS — R6520 Severe sepsis without septic shock: Secondary | ICD-10-CM | POA: Diagnosis not present

## 2023-12-08 DIAGNOSIS — T83518A Infection and inflammatory reaction due to other urinary catheter, initial encounter: Secondary | ICD-10-CM | POA: Diagnosis not present

## 2023-12-08 DIAGNOSIS — N261 Atrophy of kidney (terminal): Secondary | ICD-10-CM | POA: Diagnosis present

## 2023-12-08 DIAGNOSIS — Z936 Other artificial openings of urinary tract status: Secondary | ICD-10-CM | POA: Diagnosis not present

## 2023-12-08 DIAGNOSIS — K592 Neurogenic bowel, not elsewhere classified: Secondary | ICD-10-CM | POA: Diagnosis not present

## 2023-12-08 DIAGNOSIS — Z7189 Other specified counseling: Secondary | ICD-10-CM | POA: Diagnosis not present

## 2023-12-08 DIAGNOSIS — N312 Flaccid neuropathic bladder, not elsewhere classified: Secondary | ICD-10-CM | POA: Diagnosis not present

## 2023-12-08 DIAGNOSIS — I2510 Atherosclerotic heart disease of native coronary artery without angina pectoris: Secondary | ICD-10-CM | POA: Diagnosis not present

## 2023-12-08 DIAGNOSIS — I959 Hypotension, unspecified: Secondary | ICD-10-CM | POA: Diagnosis not present

## 2023-12-08 DIAGNOSIS — R339 Retention of urine, unspecified: Secondary | ICD-10-CM | POA: Diagnosis not present

## 2023-12-08 DIAGNOSIS — Y95 Nosocomial condition: Secondary | ICD-10-CM | POA: Diagnosis present

## 2023-12-08 DIAGNOSIS — R0902 Hypoxemia: Secondary | ICD-10-CM | POA: Diagnosis not present

## 2023-12-08 DIAGNOSIS — Z436 Encounter for attention to other artificial openings of urinary tract: Secondary | ICD-10-CM | POA: Diagnosis not present

## 2023-12-08 DIAGNOSIS — J9601 Acute respiratory failure with hypoxia: Secondary | ICD-10-CM | POA: Diagnosis not present

## 2023-12-08 DIAGNOSIS — N184 Chronic kidney disease, stage 4 (severe): Secondary | ICD-10-CM | POA: Diagnosis not present

## 2023-12-08 DIAGNOSIS — N390 Urinary tract infection, site not specified: Secondary | ICD-10-CM | POA: Diagnosis not present

## 2023-12-08 DIAGNOSIS — N2581 Secondary hyperparathyroidism of renal origin: Secondary | ICD-10-CM | POA: Diagnosis not present

## 2023-12-08 DIAGNOSIS — R159 Full incontinence of feces: Secondary | ICD-10-CM | POA: Diagnosis not present

## 2023-12-08 DIAGNOSIS — L89154 Pressure ulcer of sacral region, stage 4: Secondary | ICD-10-CM | POA: Diagnosis not present

## 2023-12-08 DIAGNOSIS — J8410 Pulmonary fibrosis, unspecified: Secondary | ICD-10-CM | POA: Diagnosis not present

## 2023-12-08 DIAGNOSIS — I639 Cerebral infarction, unspecified: Secondary | ICD-10-CM | POA: Diagnosis present

## 2023-12-08 DIAGNOSIS — Z9359 Other cystostomy status: Secondary | ICD-10-CM | POA: Diagnosis not present

## 2023-12-08 DIAGNOSIS — R110 Nausea: Secondary | ICD-10-CM | POA: Diagnosis not present

## 2023-12-08 DIAGNOSIS — R1310 Dysphagia, unspecified: Secondary | ICD-10-CM | POA: Diagnosis present

## 2023-12-08 DIAGNOSIS — S81809A Unspecified open wound, unspecified lower leg, initial encounter: Secondary | ICD-10-CM | POA: Diagnosis not present

## 2023-12-08 DIAGNOSIS — N200 Calculus of kidney: Secondary | ICD-10-CM | POA: Diagnosis not present

## 2023-12-08 NOTE — Telephone Encounter (Signed)
 Referral for neurosurgery fax to [facility]. Phone: [phone], Fax: [phone]

## 2023-12-09 DIAGNOSIS — G894 Chronic pain syndrome: Secondary | ICD-10-CM | POA: Diagnosis not present

## 2023-12-09 DIAGNOSIS — I10 Essential (primary) hypertension: Secondary | ICD-10-CM | POA: Diagnosis not present

## 2023-12-09 DIAGNOSIS — L89154 Pressure ulcer of sacral region, stage 4: Secondary | ICD-10-CM | POA: Diagnosis not present

## 2023-12-09 DIAGNOSIS — L89310 Pressure ulcer of right buttock, unstageable: Secondary | ICD-10-CM | POA: Diagnosis not present

## 2023-12-09 DIAGNOSIS — M6281 Muscle weakness (generalized): Secondary | ICD-10-CM | POA: Diagnosis not present

## 2023-12-09 DIAGNOSIS — R159 Full incontinence of feces: Secondary | ICD-10-CM | POA: Diagnosis not present

## 2023-12-09 DIAGNOSIS — G35 Multiple sclerosis: Secondary | ICD-10-CM | POA: Diagnosis not present

## 2023-12-09 DIAGNOSIS — S71009A Unspecified open wound, unspecified hip, initial encounter: Secondary | ICD-10-CM | POA: Diagnosis not present

## 2023-12-09 DIAGNOSIS — L89620 Pressure ulcer of left heel, unstageable: Secondary | ICD-10-CM | POA: Diagnosis not present

## 2023-12-09 DIAGNOSIS — S81809A Unspecified open wound, unspecified lower leg, initial encounter: Secondary | ICD-10-CM | POA: Diagnosis not present

## 2023-12-09 DIAGNOSIS — L89510 Pressure ulcer of right ankle, unstageable: Secondary | ICD-10-CM | POA: Diagnosis not present

## 2023-12-09 DIAGNOSIS — L8994 Pressure ulcer of unspecified site, stage 4: Secondary | ICD-10-CM | POA: Diagnosis not present

## 2023-12-09 DIAGNOSIS — L89896 Pressure-induced deep tissue damage of other site: Secondary | ICD-10-CM | POA: Diagnosis not present

## 2023-12-11 DIAGNOSIS — S71009A Unspecified open wound, unspecified hip, initial encounter: Secondary | ICD-10-CM | POA: Diagnosis not present

## 2023-12-11 DIAGNOSIS — G35 Multiple sclerosis: Secondary | ICD-10-CM | POA: Diagnosis not present

## 2023-12-11 DIAGNOSIS — M6281 Muscle weakness (generalized): Secondary | ICD-10-CM | POA: Diagnosis not present

## 2023-12-11 DIAGNOSIS — L8994 Pressure ulcer of unspecified site, stage 4: Secondary | ICD-10-CM | POA: Diagnosis not present

## 2023-12-11 DIAGNOSIS — F419 Anxiety disorder, unspecified: Secondary | ICD-10-CM | POA: Diagnosis not present

## 2023-12-11 DIAGNOSIS — I10 Essential (primary) hypertension: Secondary | ICD-10-CM | POA: Diagnosis not present

## 2023-12-11 DIAGNOSIS — F5102 Adjustment insomnia: Secondary | ICD-10-CM | POA: Diagnosis not present

## 2023-12-11 DIAGNOSIS — S81809A Unspecified open wound, unspecified lower leg, initial encounter: Secondary | ICD-10-CM | POA: Diagnosis not present

## 2023-12-14 DIAGNOSIS — L8994 Pressure ulcer of unspecified site, stage 4: Secondary | ICD-10-CM | POA: Diagnosis not present

## 2023-12-14 DIAGNOSIS — F4322 Adjustment disorder with anxiety: Secondary | ICD-10-CM | POA: Diagnosis not present

## 2023-12-14 DIAGNOSIS — I10 Essential (primary) hypertension: Secondary | ICD-10-CM | POA: Diagnosis not present

## 2023-12-14 DIAGNOSIS — G35 Multiple sclerosis: Secondary | ICD-10-CM | POA: Diagnosis not present

## 2023-12-14 DIAGNOSIS — S71009A Unspecified open wound, unspecified hip, initial encounter: Secondary | ICD-10-CM | POA: Diagnosis not present

## 2023-12-14 DIAGNOSIS — S81809A Unspecified open wound, unspecified lower leg, initial encounter: Secondary | ICD-10-CM | POA: Diagnosis not present

## 2023-12-14 DIAGNOSIS — M6281 Muscle weakness (generalized): Secondary | ICD-10-CM | POA: Diagnosis not present

## 2023-12-15 ENCOUNTER — Encounter: Payer: Medicare Other | Admitting: Vascular Surgery

## 2023-12-16 DIAGNOSIS — S81809A Unspecified open wound, unspecified lower leg, initial encounter: Secondary | ICD-10-CM | POA: Diagnosis not present

## 2023-12-16 DIAGNOSIS — S71009A Unspecified open wound, unspecified hip, initial encounter: Secondary | ICD-10-CM | POA: Diagnosis not present

## 2023-12-16 DIAGNOSIS — G35 Multiple sclerosis: Secondary | ICD-10-CM | POA: Diagnosis not present

## 2023-12-16 DIAGNOSIS — L89154 Pressure ulcer of sacral region, stage 4: Secondary | ICD-10-CM | POA: Diagnosis not present

## 2023-12-16 DIAGNOSIS — M6281 Muscle weakness (generalized): Secondary | ICD-10-CM | POA: Diagnosis not present

## 2023-12-16 DIAGNOSIS — R159 Full incontinence of feces: Secondary | ICD-10-CM | POA: Diagnosis not present

## 2023-12-16 DIAGNOSIS — I10 Essential (primary) hypertension: Secondary | ICD-10-CM | POA: Diagnosis not present

## 2023-12-16 DIAGNOSIS — L89896 Pressure-induced deep tissue damage of other site: Secondary | ICD-10-CM | POA: Diagnosis not present

## 2023-12-16 DIAGNOSIS — G894 Chronic pain syndrome: Secondary | ICD-10-CM | POA: Diagnosis not present

## 2023-12-16 DIAGNOSIS — L89510 Pressure ulcer of right ankle, unstageable: Secondary | ICD-10-CM | POA: Diagnosis not present

## 2023-12-16 DIAGNOSIS — N319 Neuromuscular dysfunction of bladder, unspecified: Secondary | ICD-10-CM | POA: Diagnosis not present

## 2023-12-16 DIAGNOSIS — L8994 Pressure ulcer of unspecified site, stage 4: Secondary | ICD-10-CM | POA: Diagnosis not present

## 2023-12-16 DIAGNOSIS — L89310 Pressure ulcer of right buttock, unstageable: Secondary | ICD-10-CM | POA: Diagnosis not present

## 2023-12-16 DIAGNOSIS — L89620 Pressure ulcer of left heel, unstageable: Secondary | ICD-10-CM | POA: Diagnosis not present

## 2023-12-18 DIAGNOSIS — M6281 Muscle weakness (generalized): Secondary | ICD-10-CM | POA: Diagnosis not present

## 2023-12-18 DIAGNOSIS — S71009A Unspecified open wound, unspecified hip, initial encounter: Secondary | ICD-10-CM | POA: Diagnosis not present

## 2023-12-18 DIAGNOSIS — S81809A Unspecified open wound, unspecified lower leg, initial encounter: Secondary | ICD-10-CM | POA: Diagnosis not present

## 2023-12-18 DIAGNOSIS — N319 Neuromuscular dysfunction of bladder, unspecified: Secondary | ICD-10-CM | POA: Diagnosis not present

## 2023-12-18 DIAGNOSIS — L8994 Pressure ulcer of unspecified site, stage 4: Secondary | ICD-10-CM | POA: Diagnosis not present

## 2023-12-18 DIAGNOSIS — G35 Multiple sclerosis: Secondary | ICD-10-CM | POA: Diagnosis not present

## 2023-12-18 DIAGNOSIS — I10 Essential (primary) hypertension: Secondary | ICD-10-CM | POA: Diagnosis not present

## 2023-12-21 DIAGNOSIS — S81809A Unspecified open wound, unspecified lower leg, initial encounter: Secondary | ICD-10-CM | POA: Diagnosis not present

## 2023-12-21 DIAGNOSIS — N319 Neuromuscular dysfunction of bladder, unspecified: Secondary | ICD-10-CM | POA: Diagnosis not present

## 2023-12-21 DIAGNOSIS — L8994 Pressure ulcer of unspecified site, stage 4: Secondary | ICD-10-CM | POA: Diagnosis not present

## 2023-12-21 DIAGNOSIS — M6281 Muscle weakness (generalized): Secondary | ICD-10-CM | POA: Diagnosis not present

## 2023-12-21 DIAGNOSIS — I10 Essential (primary) hypertension: Secondary | ICD-10-CM | POA: Diagnosis not present

## 2023-12-21 DIAGNOSIS — S71009A Unspecified open wound, unspecified hip, initial encounter: Secondary | ICD-10-CM | POA: Diagnosis not present

## 2023-12-21 DIAGNOSIS — G35 Multiple sclerosis: Secondary | ICD-10-CM | POA: Diagnosis not present

## 2023-12-22 ENCOUNTER — Other Ambulatory Visit (HOSPITAL_COMMUNITY): Payer: Medicare Other

## 2023-12-22 ENCOUNTER — Other Ambulatory Visit (HOSPITAL_COMMUNITY): Payer: Self-pay | Admitting: Radiology

## 2023-12-22 ENCOUNTER — Ambulatory Visit (HOSPITAL_COMMUNITY)
Admission: RE | Admit: 2023-12-22 | Discharge: 2023-12-22 | Disposition: A | Discharge status: Home / Self Care | Payer: Medicare Other | Source: Ambulatory Visit | Attending: Radiology | Admitting: Radiology

## 2023-12-22 DIAGNOSIS — Z436 Encounter for attention to other artificial openings of urinary tract: Secondary | ICD-10-CM | POA: Diagnosis not present

## 2023-12-22 DIAGNOSIS — N319 Neuromuscular dysfunction of bladder, unspecified: Secondary | ICD-10-CM | POA: Diagnosis not present

## 2023-12-22 HISTORY — PX: IR NEPHROSTOMY EXCHANGE LEFT: IMG6069

## 2023-12-22 MED ORDER — LIDOCAINE HCL 1 % IJ SOLN
INTRAMUSCULAR | Status: AC
  Filled 2023-12-22: qty 20

## 2023-12-22 MED ORDER — IOHEXOL 300 MG/ML  SOLN
50.0000 mL | Freq: Once | INTRAMUSCULAR | Status: AC | PRN
  Administered 2023-12-22: 10 mL

## 2023-12-23 DIAGNOSIS — L89620 Pressure ulcer of left heel, unstageable: Secondary | ICD-10-CM | POA: Diagnosis not present

## 2023-12-23 DIAGNOSIS — R109 Unspecified abdominal pain: Secondary | ICD-10-CM | POA: Diagnosis not present

## 2023-12-23 DIAGNOSIS — L89310 Pressure ulcer of right buttock, unstageable: Secondary | ICD-10-CM | POA: Diagnosis not present

## 2023-12-23 DIAGNOSIS — M6281 Muscle weakness (generalized): Secondary | ICD-10-CM | POA: Diagnosis not present

## 2023-12-23 DIAGNOSIS — R112 Nausea with vomiting, unspecified: Secondary | ICD-10-CM | POA: Diagnosis not present

## 2023-12-23 DIAGNOSIS — S71009A Unspecified open wound, unspecified hip, initial encounter: Secondary | ICD-10-CM | POA: Diagnosis not present

## 2023-12-23 DIAGNOSIS — L89154 Pressure ulcer of sacral region, stage 4: Secondary | ICD-10-CM | POA: Diagnosis not present

## 2023-12-23 DIAGNOSIS — L8994 Pressure ulcer of unspecified site, stage 4: Secondary | ICD-10-CM | POA: Diagnosis not present

## 2023-12-23 DIAGNOSIS — N319 Neuromuscular dysfunction of bladder, unspecified: Secondary | ICD-10-CM | POA: Diagnosis not present

## 2023-12-23 DIAGNOSIS — R159 Full incontinence of feces: Secondary | ICD-10-CM | POA: Diagnosis not present

## 2023-12-23 DIAGNOSIS — G35 Multiple sclerosis: Secondary | ICD-10-CM | POA: Diagnosis not present

## 2023-12-23 DIAGNOSIS — L89896 Pressure-induced deep tissue damage of other site: Secondary | ICD-10-CM | POA: Diagnosis not present

## 2023-12-23 DIAGNOSIS — S81809A Unspecified open wound, unspecified lower leg, initial encounter: Secondary | ICD-10-CM | POA: Diagnosis not present

## 2023-12-23 DIAGNOSIS — L89510 Pressure ulcer of right ankle, unstageable: Secondary | ICD-10-CM | POA: Diagnosis not present

## 2023-12-23 DIAGNOSIS — G894 Chronic pain syndrome: Secondary | ICD-10-CM | POA: Diagnosis not present

## 2023-12-24 DIAGNOSIS — L8994 Pressure ulcer of unspecified site, stage 4: Secondary | ICD-10-CM | POA: Diagnosis not present

## 2023-12-24 DIAGNOSIS — M6281 Muscle weakness (generalized): Secondary | ICD-10-CM | POA: Diagnosis not present

## 2023-12-24 DIAGNOSIS — S71009A Unspecified open wound, unspecified hip, initial encounter: Secondary | ICD-10-CM | POA: Diagnosis not present

## 2023-12-24 DIAGNOSIS — G35 Multiple sclerosis: Secondary | ICD-10-CM | POA: Diagnosis not present

## 2023-12-24 DIAGNOSIS — S81809A Unspecified open wound, unspecified lower leg, initial encounter: Secondary | ICD-10-CM | POA: Diagnosis not present

## 2023-12-24 DIAGNOSIS — N319 Neuromuscular dysfunction of bladder, unspecified: Secondary | ICD-10-CM | POA: Diagnosis not present

## 2023-12-24 DIAGNOSIS — I10 Essential (primary) hypertension: Secondary | ICD-10-CM | POA: Diagnosis not present

## 2023-12-25 DIAGNOSIS — N319 Neuromuscular dysfunction of bladder, unspecified: Secondary | ICD-10-CM | POA: Diagnosis not present

## 2023-12-25 DIAGNOSIS — M6281 Muscle weakness (generalized): Secondary | ICD-10-CM | POA: Diagnosis not present

## 2023-12-25 DIAGNOSIS — G35 Multiple sclerosis: Secondary | ICD-10-CM | POA: Diagnosis not present

## 2023-12-25 DIAGNOSIS — R112 Nausea with vomiting, unspecified: Secondary | ICD-10-CM | POA: Diagnosis not present

## 2023-12-25 DIAGNOSIS — L8994 Pressure ulcer of unspecified site, stage 4: Secondary | ICD-10-CM | POA: Diagnosis not present

## 2023-12-25 DIAGNOSIS — S71009A Unspecified open wound, unspecified hip, initial encounter: Secondary | ICD-10-CM | POA: Diagnosis not present

## 2023-12-25 DIAGNOSIS — R109 Unspecified abdominal pain: Secondary | ICD-10-CM | POA: Diagnosis not present

## 2023-12-25 DIAGNOSIS — S81809A Unspecified open wound, unspecified lower leg, initial encounter: Secondary | ICD-10-CM | POA: Diagnosis not present

## 2023-12-28 ENCOUNTER — Ambulatory Visit: Payer: Medicare Other | Admitting: Diagnostic Neuroimaging

## 2023-12-29 DIAGNOSIS — S81809A Unspecified open wound, unspecified lower leg, initial encounter: Secondary | ICD-10-CM | POA: Diagnosis not present

## 2023-12-29 DIAGNOSIS — R159 Full incontinence of feces: Secondary | ICD-10-CM | POA: Diagnosis not present

## 2023-12-29 DIAGNOSIS — G894 Chronic pain syndrome: Secondary | ICD-10-CM | POA: Diagnosis not present

## 2023-12-29 DIAGNOSIS — L89154 Pressure ulcer of sacral region, stage 4: Secondary | ICD-10-CM | POA: Diagnosis not present

## 2023-12-29 DIAGNOSIS — M6281 Muscle weakness (generalized): Secondary | ICD-10-CM | POA: Diagnosis not present

## 2023-12-29 DIAGNOSIS — I10 Essential (primary) hypertension: Secondary | ICD-10-CM | POA: Diagnosis not present

## 2023-12-29 DIAGNOSIS — G35 Multiple sclerosis: Secondary | ICD-10-CM | POA: Diagnosis not present

## 2023-12-29 DIAGNOSIS — L89310 Pressure ulcer of right buttock, unstageable: Secondary | ICD-10-CM | POA: Diagnosis not present

## 2023-12-29 DIAGNOSIS — L8994 Pressure ulcer of unspecified site, stage 4: Secondary | ICD-10-CM | POA: Diagnosis not present

## 2023-12-29 DIAGNOSIS — N319 Neuromuscular dysfunction of bladder, unspecified: Secondary | ICD-10-CM | POA: Diagnosis not present

## 2023-12-29 DIAGNOSIS — L89510 Pressure ulcer of right ankle, unstageable: Secondary | ICD-10-CM | POA: Diagnosis not present

## 2023-12-29 DIAGNOSIS — L89620 Pressure ulcer of left heel, unstageable: Secondary | ICD-10-CM | POA: Diagnosis not present

## 2023-12-29 DIAGNOSIS — S71009A Unspecified open wound, unspecified hip, initial encounter: Secondary | ICD-10-CM | POA: Diagnosis not present

## 2023-12-30 DIAGNOSIS — S81809A Unspecified open wound, unspecified lower leg, initial encounter: Secondary | ICD-10-CM | POA: Diagnosis not present

## 2023-12-30 DIAGNOSIS — M6281 Muscle weakness (generalized): Secondary | ICD-10-CM | POA: Diagnosis not present

## 2023-12-30 DIAGNOSIS — N319 Neuromuscular dysfunction of bladder, unspecified: Secondary | ICD-10-CM | POA: Diagnosis not present

## 2023-12-30 DIAGNOSIS — I10 Essential (primary) hypertension: Secondary | ICD-10-CM | POA: Diagnosis not present

## 2023-12-30 DIAGNOSIS — G35 Multiple sclerosis: Secondary | ICD-10-CM | POA: Diagnosis not present

## 2023-12-30 DIAGNOSIS — S71009A Unspecified open wound, unspecified hip, initial encounter: Secondary | ICD-10-CM | POA: Diagnosis not present

## 2023-12-30 DIAGNOSIS — L8994 Pressure ulcer of unspecified site, stage 4: Secondary | ICD-10-CM | POA: Diagnosis not present

## 2024-01-01 DIAGNOSIS — G35 Multiple sclerosis: Secondary | ICD-10-CM | POA: Diagnosis not present

## 2024-01-01 DIAGNOSIS — M6281 Muscle weakness (generalized): Secondary | ICD-10-CM | POA: Diagnosis not present

## 2024-01-01 DIAGNOSIS — S71009A Unspecified open wound, unspecified hip, initial encounter: Secondary | ICD-10-CM | POA: Diagnosis not present

## 2024-01-01 DIAGNOSIS — R110 Nausea: Secondary | ICD-10-CM | POA: Diagnosis not present

## 2024-01-01 DIAGNOSIS — L8994 Pressure ulcer of unspecified site, stage 4: Secondary | ICD-10-CM | POA: Diagnosis not present

## 2024-01-01 DIAGNOSIS — I10 Essential (primary) hypertension: Secondary | ICD-10-CM | POA: Diagnosis not present

## 2024-01-01 DIAGNOSIS — S81809A Unspecified open wound, unspecified lower leg, initial encounter: Secondary | ICD-10-CM | POA: Diagnosis not present

## 2024-01-01 DIAGNOSIS — N319 Neuromuscular dysfunction of bladder, unspecified: Secondary | ICD-10-CM | POA: Diagnosis not present

## 2024-01-04 DIAGNOSIS — I10 Essential (primary) hypertension: Secondary | ICD-10-CM | POA: Diagnosis not present

## 2024-01-04 DIAGNOSIS — M21371 Foot drop, right foot: Secondary | ICD-10-CM | POA: Diagnosis not present

## 2024-01-04 DIAGNOSIS — S81809A Unspecified open wound, unspecified lower leg, initial encounter: Secondary | ICD-10-CM | POA: Diagnosis not present

## 2024-01-04 DIAGNOSIS — S71009A Unspecified open wound, unspecified hip, initial encounter: Secondary | ICD-10-CM | POA: Diagnosis not present

## 2024-01-04 DIAGNOSIS — G35 Multiple sclerosis: Secondary | ICD-10-CM | POA: Diagnosis not present

## 2024-01-04 DIAGNOSIS — M6281 Muscle weakness (generalized): Secondary | ICD-10-CM | POA: Diagnosis not present

## 2024-01-04 DIAGNOSIS — L8994 Pressure ulcer of unspecified site, stage 4: Secondary | ICD-10-CM | POA: Diagnosis not present

## 2024-01-04 DIAGNOSIS — N319 Neuromuscular dysfunction of bladder, unspecified: Secondary | ICD-10-CM | POA: Diagnosis not present

## 2024-01-06 ENCOUNTER — Ambulatory Visit: Payer: Self-pay | Admitting: *Deleted

## 2024-01-06 ENCOUNTER — Encounter: Payer: Self-pay | Admitting: *Deleted

## 2024-01-06 DIAGNOSIS — M6281 Muscle weakness (generalized): Secondary | ICD-10-CM | POA: Diagnosis not present

## 2024-01-06 DIAGNOSIS — N319 Neuromuscular dysfunction of bladder, unspecified: Secondary | ICD-10-CM | POA: Diagnosis not present

## 2024-01-06 DIAGNOSIS — I10 Essential (primary) hypertension: Secondary | ICD-10-CM | POA: Diagnosis not present

## 2024-01-06 DIAGNOSIS — L8994 Pressure ulcer of unspecified site, stage 4: Secondary | ICD-10-CM | POA: Diagnosis not present

## 2024-01-06 DIAGNOSIS — S81809A Unspecified open wound, unspecified lower leg, initial encounter: Secondary | ICD-10-CM | POA: Diagnosis not present

## 2024-01-06 DIAGNOSIS — S71009A Unspecified open wound, unspecified hip, initial encounter: Secondary | ICD-10-CM | POA: Diagnosis not present

## 2024-01-06 DIAGNOSIS — G35 Multiple sclerosis: Secondary | ICD-10-CM | POA: Diagnosis not present

## 2024-01-07 DIAGNOSIS — L89310 Pressure ulcer of right buttock, unstageable: Secondary | ICD-10-CM | POA: Diagnosis not present

## 2024-01-07 DIAGNOSIS — R159 Full incontinence of feces: Secondary | ICD-10-CM | POA: Diagnosis not present

## 2024-01-07 DIAGNOSIS — M6281 Muscle weakness (generalized): Secondary | ICD-10-CM | POA: Diagnosis not present

## 2024-01-07 DIAGNOSIS — L89510 Pressure ulcer of right ankle, unstageable: Secondary | ICD-10-CM | POA: Diagnosis not present

## 2024-01-07 DIAGNOSIS — L89620 Pressure ulcer of left heel, unstageable: Secondary | ICD-10-CM | POA: Diagnosis not present

## 2024-01-07 DIAGNOSIS — G894 Chronic pain syndrome: Secondary | ICD-10-CM | POA: Diagnosis not present

## 2024-01-07 DIAGNOSIS — G35 Multiple sclerosis: Secondary | ICD-10-CM | POA: Diagnosis not present

## 2024-01-07 DIAGNOSIS — L89154 Pressure ulcer of sacral region, stage 4: Secondary | ICD-10-CM | POA: Diagnosis not present

## 2024-01-08 DIAGNOSIS — S71009A Unspecified open wound, unspecified hip, initial encounter: Secondary | ICD-10-CM | POA: Diagnosis not present

## 2024-01-08 DIAGNOSIS — M6281 Muscle weakness (generalized): Secondary | ICD-10-CM | POA: Diagnosis not present

## 2024-01-08 DIAGNOSIS — N319 Neuromuscular dysfunction of bladder, unspecified: Secondary | ICD-10-CM | POA: Diagnosis not present

## 2024-01-08 DIAGNOSIS — I10 Essential (primary) hypertension: Secondary | ICD-10-CM | POA: Diagnosis not present

## 2024-01-08 DIAGNOSIS — L8994 Pressure ulcer of unspecified site, stage 4: Secondary | ICD-10-CM | POA: Diagnosis not present

## 2024-01-08 DIAGNOSIS — S81809A Unspecified open wound, unspecified lower leg, initial encounter: Secondary | ICD-10-CM | POA: Diagnosis not present

## 2024-01-08 DIAGNOSIS — G35 Multiple sclerosis: Secondary | ICD-10-CM | POA: Diagnosis not present

## 2024-01-11 DIAGNOSIS — L8994 Pressure ulcer of unspecified site, stage 4: Secondary | ICD-10-CM | POA: Diagnosis not present

## 2024-01-11 DIAGNOSIS — G35 Multiple sclerosis: Secondary | ICD-10-CM | POA: Diagnosis not present

## 2024-01-11 DIAGNOSIS — N319 Neuromuscular dysfunction of bladder, unspecified: Secondary | ICD-10-CM | POA: Diagnosis not present

## 2024-01-11 DIAGNOSIS — S81809A Unspecified open wound, unspecified lower leg, initial encounter: Secondary | ICD-10-CM | POA: Diagnosis not present

## 2024-01-11 DIAGNOSIS — I10 Essential (primary) hypertension: Secondary | ICD-10-CM | POA: Diagnosis not present

## 2024-01-11 DIAGNOSIS — M6281 Muscle weakness (generalized): Secondary | ICD-10-CM | POA: Diagnosis not present

## 2024-01-11 DIAGNOSIS — S71009A Unspecified open wound, unspecified hip, initial encounter: Secondary | ICD-10-CM | POA: Diagnosis not present

## 2024-01-11 NOTE — Progress Notes (Signed)
 History of Present Illness:   He has a neurogenic bladder and has had a longstanding suprapubic tube.  Currently, output is not great as he has an atrophic right  kidney and a left percutaneous nephrostomy.  He still has bladder spasms and is on Myrbetriq 25 mg a day.   He has recurrent urolithiasis, left renal unit.  Because of multiple ureteral stones and longstanding hydronephrosis, he had a left-sided nephrostomy tube placed in May, 2022.     CT abdomen and pelvis for follow-up of urolithiasis was performed 12.11.2022 revealing hydronephrosis and misplaced percutaneous tube.  He did have a 16mm left renal pelvic stone at that time.   1.31.2023: Here because of recent fever/probable UTI.  Had a suprapubic tube changed on the 27th.     4.18.2023: Here today for routine check.  Has suprapubic tube scheduled to be changed today.  In early 2023 his left renal pelvic stone was 16 mm in size.  He is having no symptoms from that.  The large majority of his urine comes out his left nephrostomy tube.  No recent treatment for urinary tract infections.  7.18.2023: Here for f/u. KUB 5.23.203 revealed continued 16 mm Lt renal stone. No real problems noted since last visit here. He takes levaquin before tube changes.  1.14.2024: Gearld is here today for routine check/catheter change.  Still getting his nephrostomy tube changed about every 6 weeks, his suprapubic changed here every month.  Just discharged from the hospital yesterday for management of a minor stroke as well as pneumonia.  He is in the nursing facility temporarily.  Most recent CT scan revealed a 10 mm left renal stone.  4.15.2025: Here for routine f/u. Needed Lt perc tube replaced in late January.  Past Medical History:  Diagnosis Date   Anemia    Arthritis    Back pain, chronic    Bilateral carotid bruits    C. difficile colitis 09/2011   CAD (coronary artery disease)    Carotid artery stenosis    Cerebrovascular disease     Cerebrovascular disease 08/17/2018   Colon polyps    Complication of cystostomy catheter, initial encounter (HCC) 04/20/2020   Dyslipidemia    Dysphagia 10/07/2011   Encephalopathy    Gait disorder    HA (headache)    High grade dysplasia in colonic adenoma 09/2005   History of kidney stones    HTN (hypertension), malignant 10/06/2011   Hypernatremia    Hypokalemia    Hypothyroidism 10/08/2011   Insomnia    Junctional rhythm    Kidney stones    MS (multiple sclerosis) (HCC)    Nausea and vomiting 03/02/2020   Neuromuscular disorder (HCC)    MS   OSA (obstructive sleep apnea)    Paroxysmal atrial tachycardia (HCC)    Peripheral vascular disease (HCC)    Pneumonia 4 yrs ago   Pulmonary fibrosis (HCC) 10/06/2011   Pulmonary nodule 10/08/2011   PVD (peripheral vascular disease) (HCC)    Sacral ulcer (HCC)    Sleep apnea    cannot tolerate   Stroke (HCC)    left sided weakness   Suprapubic catheter (HCC)    TIA (transient ischemic attack)    Tremors of nervous system 10/08/2011   Urinary tract infection    Ventral hernia without obstruction or gangrene     Large 8X9cm ventral hernia with loss of domain. CT reads report as diastasis recti with herniation or diastasis recti.  Dr. Collene Dawson, Surgery, reviewed CT with radiology and  there is herniation with only hernia sac or peritoneum over the bowel and large separation of the rectus muslce (i.e. diastasis recti aka loss of domain).  No surgical intervention recommended given size, age, and health.     Past Surgical History:  Procedure Laterality Date   APPENDECTOMY  09/2005   at time of left hemicolectomy   BACK SURGERY  1976/1979   lower   BILIARY DILATION N/A 03/03/2020   Procedure: BILIARY DILATION;  Surgeon: Malissa Hippo, MD;  [facility];  Service: Endoscopy;  Laterality: N/A;   CATARACT EXTRACTION W/PHACO Right 03/08/2018   Procedure: CATARACT EXTRACTION PHACO AND INTRAOCULAR LENS PLACEMENT RIGHT EYE;   Surgeon: Gemma Payor, MD;  Location: [facility];  Service: Ophthalmology;  Laterality: Right;  CDE: 8.86   CATARACT EXTRACTION W/PHACO Left 04/05/2018   Procedure: CATARACT EXTRACTION PHACO AND INTRAOCULAR LENS PLACEMENT (IOC);  Surgeon: Gemma Payor, MD;  Location: [facility];  Service: Ophthalmology;  Laterality: Left;  CDE: 7.36   CENTRAL LINE INSERTION-TUNNELED Right 09/11/2020   Procedure: PLACEMENT OF TUNNELED CENTRAL LINE INTO JUGULAR VEIN;  Surgeon: Lucretia Roers, MD;  Location: [facility];  Service: General;  Laterality: Right;   CHOLECYSTECTOMY     Dr. Katrinka Blazing   COLON SURGERY  09/2005   Fleishman: four tubular adenomas, large adenomatous polyp with HIGH GRADE dysplasia   COLONOSCOPY  11/2004   Dr. Bonnita Levan sessile polyp splenic flexure, 10mm sessile polyp desc colon, tubulovillous adenoma (bx not removed)   COLONOSCOPY  01/2005   poor prep, polyp could not be found   COLONOSCOPY  05/2005   with EMR, polypectomy Dr. Achilles Dunk, bx showed high grade dysplasia, partially resected   COLONOSCOPY  09/2005   Dr. Gwinda Passe, [place] ink tattooing, four villous colon polyp (3 had been missed on previous colonoscopies due to limitations of procedures   COLONOSCOPY  09/2006   normal TI, no polyps   COLONOSCOPY  10/2007   Dr. Frankey Poot distal mammillations, benign bx, normal TI, random bx neg for microscopic colitis   CYSTOSCOPY W/ URETERAL STENT PLACEMENT Left 09/05/2020   Procedure: CYSTOSCOPY WITH RETROGRADE PYELOGRAM/URETERAL STENT PLACEMENT;  Surgeon: Crist Fat, MD;  [facility]: [facility];  Service: Urology;  Laterality: Left;   CYSTOSCOPY WITH LITHOLAPAXY N/A 07/27/2018   Procedure: CYSTOSCOPY WITH LITHOLAPAXY VIA  SUPRAPUBIC TUBE;  Surgeon: Marcine Matar, MD;  Location: [facility];  Service: Urology;  Laterality: N/A;   CYSTOSCOPY WITH RETROGRADE PYELOGRAM, URETEROSCOPY AND STENT PLACEMENT Left 06/09/2017   Procedure: CYSTOSCOPY WITH LEFT RETROGRADE PYELOGRAM, LEFT URETEROSCOPY, LEFT  URETEROSCOPIC STONE EXTRACTION, LEFT URETERAL STENT PLACEMENT;  Surgeon: Marcine Matar, MD;  Location: [facility];  Service: Urology;  Laterality: Left;   CYSTOSCOPY/URETEROSCOPY/HOLMIUM LASER/STENT PLACEMENT Left 05/22/2020   Procedure: CYSTOSCOPY/URETEROSCOPY/STENT PLACEMENT;  Surgeon: Marcine Matar, MD;  [facility]: [facility];  Service: Urology;  Laterality: Left;   ERCP N/A 03/03/2020   Procedure: ENDOSCOPIC RETROGRADE CHOLANGIOPANCREATOGRAPHY (ERCP);  Surgeon: Malissa Hippo, MD;  [facility];  Service: Endoscopy;  Laterality: N/A;   ERCP N/A 04/20/2020   Procedure: ENDOSCOPIC RETROGRADE CHOLANGIOPANCREATOGRAPHY (ERCP);  Surgeon: Malissa Hippo, MD;  [facility];  Service: Endoscopy;  Laterality: N/A;  to be done at 7:30am in OR   GASTROINTESTINAL STENT REMOVAL N/A 04/20/2020   Procedure: STENT REMOVAL;  Surgeon: Malissa Hippo, MD;  [facility];  Service: Endoscopy;  Laterality: N/A;   INGUINAL HERNIA REPAIR  1971   bilateral   INSERTION OF SUPRAPUBIC CATHETER  06/09/2017   Procedure: EXCHANGE OF SUPRAPUBIC CATHETER;  Surgeon: Trent Frizzle, MD;  Location: [facility];  Service: Urology;;   INSERTION OF SUPRAPUBIC CATHETER  05/22/2020   Procedure: SUPRAPUBIC CATHETER EXCHANGE;  Surgeon: Trent Frizzle, MD;  Location: [facility];  Service: Urology;;   IR NEPHROSTOMY EXCHANGE LEFT  03/25/2021   IR NEPHROSTOMY EXCHANGE LEFT  05/20/2021   IR NEPHROSTOMY EXCHANGE LEFT  07/13/2021   IR NEPHROSTOMY EXCHANGE LEFT  09/09/2021   IR NEPHROSTOMY EXCHANGE LEFT  11/04/2021   IR NEPHROSTOMY EXCHANGE LEFT  12/23/2021   IR NEPHROSTOMY EXCHANGE LEFT  02/17/2022   IR NEPHROSTOMY EXCHANGE LEFT  04/14/2022   IR NEPHROSTOMY EXCHANGE LEFT  06/09/2022   IR NEPHROSTOMY EXCHANGE LEFT  07/28/2022   IR NEPHROSTOMY EXCHANGE LEFT  09/09/2022   IR NEPHROSTOMY EXCHANGE LEFT  11/04/2022   IR NEPHROSTOMY EXCHANGE LEFT  12/30/2022   IR NEPHROSTOMY EXCHANGE LEFT  02/25/2023   IR NEPHROSTOMY  EXCHANGE LEFT  03/31/2023   IR NEPHROSTOMY EXCHANGE LEFT  05/26/2023   IR NEPHROSTOMY EXCHANGE LEFT  07/21/2023   IR NEPHROSTOMY EXCHANGE LEFT  09/15/2023   IR NEPHROSTOMY EXCHANGE LEFT  12/22/2023   IR NEPHROSTOMY PLACEMENT LEFT  05/26/2017   IR NEPHROSTOMY PLACEMENT LEFT  05/02/2020   IR NEPHROSTOMY PLACEMENT LEFT  02/15/2021   IR NEPHROSTOMY TUBE CHANGE  10/29/2023   IR URETERAL STENT PERC REMOVAL MOD SED  03/25/2021   KIDNEY STONE SURGERY  09/13/2015   LITHOTRIPSY N/A 03/03/2020   Procedure: MECHANICAL LITHOTRIPSY WITH REMOVAL OF MULTIPLE STONE FRAGMENTS;  Surgeon: Ruby Corporal, MD;  [facility];  Service: Endoscopy;  Laterality: N/A;   NEPHROLITHOTOMY Left 09/13/2015   Procedure: LEFT PERCUTANEOUS NEPHROLITHOTOMY ;  Surgeon: Trent Frizzle, MD;  Location: [facility];  Service: Urology;  Laterality: Left;   NEPHROSTOMY TUBE REMOVAL  05/22/2020   Procedure: NEPHROSTOMY TUBE REMOVAL;  Surgeon: Trent Frizzle, MD;  Location: [facility];  Service: Urology;;   [facility] REMOVAL Right 09/20/2020   Procedure: MINOR REMOVAL CENTRAL LINE;  Surgeon: Awilda Bogus, MD;  Location: [facility];  Service: General;  Laterality: Right;  Pt to arrive at 7:30am for procedure   REMOVAL OF STONES N/A 04/20/2020   Procedure: REMOVAL OF STONES;  Surgeon: Ruby Corporal, MD;  [facility];  Service: Endoscopy;  Laterality: N/A;   SPHINCTEROTOMY  03/03/2020   Procedure: BILLARY SPHINCTEROTOMY;  Surgeon: Ruby Corporal, MD;  [facility];  Service: Endoscopy;;   SUPRAPUBIC CATHETER INSERTION      Home Medications:  Allergies as of 01/12/2024       Reactions   Tetracyclines & Related Anaphylaxis, Rash   Cipro [ciprofloxacin Hcl] Other (See Comments)   "[name] out in the floor"   Ciprofloxacin    Trouble swallowing according to wife Other Reaction(s): Other (See Comments) "[name] out in the floor"        Medication List        Accurate as of January 11, 2024 10:30 AM. If you  have any questions, ask your nurse or doctor.          acetaminophen 325 MG tablet Commonly known as: TYLENOL Take 650 mg by mouth every 6 (six) hours as needed for fever, mild pain (pain score 1-3) or moderate pain (pain score 4-6).   amLODipine 10 MG tablet Commonly known as: NORVASC Take 5 mg by mouth daily.   ascorbic acid 500 MG tablet Commonly known as: VITAMIN C Take 500 mg by  mouth daily.   aspirin 81 MG chewable tablet Chew 81 mg by mouth daily.   diazepam 5 MG tablet Commonly known as: VALIUM Take 1 tablet (5 mg total) by mouth 2 (two) times daily.   HYDROcodone-acetaminophen 7.5-325 MG tablet Commonly known as: NORCO Take 1 tablet by mouth 2 (two) times daily. Max APAP 3 GM IN 24 HOURS FROM ALL SOURCES   levothyroxine 75 MCG tablet Commonly known as: SYNTHROID Take 75 mcg by mouth in the morning.   mirabegron ER 50 MG Tb24 tablet Commonly known as: MYRBETRIQ Take 1 tablet (50 mg total) by mouth daily.   OXYGEN Inhale 1 L into the lungs every evening.   pantoprazole 40 MG tablet Commonly known as: PROTONIX Take 1 tablet (40 mg total) by mouth daily.   potassium chloride SA 20 MEQ tablet Commonly known as: KLOR-CON M Take 20 mEq by mouth daily.   thiamine 100 MG tablet Commonly known as: Vitamin B-1 Take 1 tablet (100 mg total) by mouth daily.   zinc gluconate 50 MG tablet Take 50 mg by mouth daily.        Allergies:  Allergies  Allergen Reactions   Tetracyclines & Related Anaphylaxis and Rash   Cipro [Ciprofloxacin Hcl] Other (See Comments)    "[name] out in the floor"   Ciprofloxacin     Trouble swallowing according to wife  Other Reaction(s): Other (See Comments)  "[name] out in the floor"    Family History  Problem Relation Age of Onset   Cirrhosis Brother        etoh   Stroke Mother 109   Coronary artery disease Father 96   Heart attack Brother    Cancer Sister    Multiple sclerosis Other    Colon cancer Neg Hx     Social  History:  reports that he quit smoking about 35 years ago. His smoking use included cigarettes. He started smoking about 60 years ago. He has a 25 pack-year smoking history. He has never used smokeless tobacco. He reports that he does not drink alcohol and does not use drugs.  ROS: A complete review of systems was performed.  All systems are negative except for pertinent findings as noted.  Physical Exam:  Vital signs in last 24 hours: There were no vitals taken for this visit. Constitutional:  Alert and oriented, No acute distress.  Cachectic appearing.  He is using nasal cannula oxygen Cardiovascular: Regular rate  Respiratory: Normal respiratory effort Neurologic: Grossly intact, no focal deficits Psychiatric: Normal mood and affect  I have reviewed prior pt notes  I have independently reviewed prior imaging--IR notes  I have reviewed prior CT scan images  Most recent hospital notes reviewed  Most recent neph. tube change notes reviewed   Impression/Assessment:  1.  Neurogenic bladder with chronic spt..  He has an atrophic right kidney but does have urinary output from his suprapubic tube.  We will continue to leave that in and change it  2.  Left hydronephrosis with recurrent urolithiasis.  He is drained on that side with the percutaneous nephrostomy tube which has been working well for him.  3.  10 mm left renal stone, currently asymptomatic, and a very cachectic individual  Plan:

## 2024-01-12 ENCOUNTER — Ambulatory Visit (INDEPENDENT_AMBULATORY_CARE_PROVIDER_SITE_OTHER): Payer: Medicare Other | Admitting: Urology

## 2024-01-12 ENCOUNTER — Telehealth: Payer: Self-pay

## 2024-01-12 VITALS — BP 105/62 | HR 96

## 2024-01-12 DIAGNOSIS — N200 Calculus of kidney: Secondary | ICD-10-CM | POA: Diagnosis not present

## 2024-01-12 DIAGNOSIS — N261 Atrophy of kidney (terminal): Secondary | ICD-10-CM

## 2024-01-12 DIAGNOSIS — R339 Retention of urine, unspecified: Secondary | ICD-10-CM | POA: Diagnosis not present

## 2024-01-12 DIAGNOSIS — N1330 Unspecified hydronephrosis: Secondary | ICD-10-CM

## 2024-01-12 DIAGNOSIS — Z435 Encounter for attention to cystostomy: Secondary | ICD-10-CM | POA: Diagnosis not present

## 2024-01-12 DIAGNOSIS — Z9359 Other cystostomy status: Secondary | ICD-10-CM

## 2024-01-12 DIAGNOSIS — Z436 Encounter for attention to other artificial openings of urinary tract: Secondary | ICD-10-CM

## 2024-01-12 DIAGNOSIS — N312 Flaccid neuropathic bladder, not elsewhere classified: Secondary | ICD-10-CM

## 2024-01-12 MED ORDER — LEVOFLOXACIN 750 MG PO TABS: 750.0000 mg | ORAL_TABLET | Freq: Every day | ORAL | 1 refills | Status: AC

## 2024-01-12 MED ORDER — CEPHALEXIN 250 MG PO CAPS
500.0000 mg | ORAL_CAPSULE | Freq: Once | ORAL | Status: AC
Start: 2024-01-12 — End: 2024-01-12
  Administered 2024-01-12: 500 mg via ORAL

## 2024-01-12 NOTE — Patient Instructions (Signed)
 Foley Catheter Care and Patient Education  Perform catheter care every day.  You can do this while in the shower, but NOT while taking a tub bath.  You will need the following supplies: -mild soap, such as Dove -water -a clean washcloth (not one already used for bathing) or a 4x4 piece of gauze -1 Cath-Secure -night drainage bag -2 alcohol swabs  Was you hands thoroughly with soap and water Using mild soap and water, clan your genital area Men should retract the foreskin, if needed, and clean the area, including the penis Women should separate the labia, and clean the area from front to back  3. Clean your urinary opening, which is where the catheter enters your body. 4. Clean the catheter from where it enters your body and then down, away from your body.  Hold the catheter at the point it enters your body so that you don't put tension on it. 5. Rinse the area well and dry it gently.  Changing the drainage bag You will change your drainage bag twice a day -in the morning after you shower, change he night bag to the leg bag -at night before you go to bed, change the leg bag to the night bag  Wash your hands thoroughly with soap and water Empty the urine from the drainage bag into the toilet before you change it Pinch off the catheter with your fingers and disconnect the used bag Wipe the end of the catheter using an alcohol pad Wipe the connector on the bag using the second alcohol pad Connect the clean bag to the catheter and release your finger pinch Check all connections.  Straighten any kinks or twits in the tubing  Caring for the Leg bag -always wear the leg bag below your knee.  This will help it drain. -keep the leg bag secure with the velcro straps.  If the straps leave a mark on your leg they are to tight and should be loosened.  Leaving the straps too tight can decrease you circulation and lead to blood clots. -empty the leg bag through the spout at the bottom every  2-4 hours as needed.  Do not let the bag become completely full. -do not lie down for longer than 2 hours while you are wearing the leg bag.  Caring for the Night Bag -always keep the night bag below the level of your bladder . -to hang your night bag while you sleep, place a clean plastic bag inside of a wastebasket.  Hang the night bag on the inside of the wastebasket.  Cleaning the Drainage bag Wash you hands thoroughly with soap and water. Rinse the equipment with cool water.  Do not use hot water it can damage the plastic equipment. Was the equipment with a mild liquid detergent (ivory) and rinse with cool water. To decrease odor, fill the bag halfway with a mixture of 1 part vinegar and 3 parts water. Shake the bag and let it sit for 15 minutes. Rinse the bag with cool water and hang it up to dry.  Preventing infection -keep the drainage bag below the level of your bladder and off the floor at all times. -keep the catheter secured to your thigh to prevent it from moving. -do not lie on or block the flow of urine in the tubing. -shower daily to keep the catheter clean. -clean your hands before and after touching the catheter or bag. -the spout of the drainage bag should never touch the side of  the toilet or any emptying container.  Special Points -You may see some blood or urine around where the catheter enters your body, especially when walking or having a bowel movement.  This is normal, as long as there is urine draining into the drainage bag.  If you experience significant leakage around  catheter tube where it enters your urethra possibly associated with lower abdominal cramping you could be having a bladder spasm.  Please notify your doctor and we can prescribe you a medication to improve your symptoms. -drink 1-2 glasses of liquids every 2 hours while you're awake.  Call your doctor immediately if -your catheter comes out, do not try to replace it yourself -you have  temperature of 101F (38.8C) or higher -you have bright red blood or large blood clots in your urine -you have abdominal pain and no urine in your catheter bag

## 2024-01-12 NOTE — Telephone Encounter (Signed)
 Pharmacy called to confirm Rx written by MD per MD Dahlstedt pt know how to take levaquin it is for once a day before procedure the day of and after

## 2024-01-12 NOTE — Progress Notes (Signed)
 Suprapubic Cath Change  Patient is present today for a suprapubic catheter change due to urinary retention.  10ml of water was drained from the balloon, a 24FR foley cath was removed from the tract with out difficulty.  Suprapubic catheter site was cleaned and prepped in a sterile fashion with Betadinex3  A 24FR foley cath was replaced into the tract no complications were noted. Urine return was noted, urine Clear yellow in color . 10 ml of sterile water was inflated into the balloon and a begside bag was attached for drainage.  Patient tolerated well.  Performed by: Maurico Perrell LPN  Follow up: 4 weeks

## 2024-01-13 DIAGNOSIS — L8994 Pressure ulcer of unspecified site, stage 4: Secondary | ICD-10-CM | POA: Diagnosis not present

## 2024-01-13 DIAGNOSIS — M6281 Muscle weakness (generalized): Secondary | ICD-10-CM | POA: Diagnosis not present

## 2024-01-13 DIAGNOSIS — G35 Multiple sclerosis: Secondary | ICD-10-CM | POA: Diagnosis not present

## 2024-01-13 DIAGNOSIS — I10 Essential (primary) hypertension: Secondary | ICD-10-CM | POA: Diagnosis not present

## 2024-01-13 DIAGNOSIS — S71009A Unspecified open wound, unspecified hip, initial encounter: Secondary | ICD-10-CM | POA: Diagnosis not present

## 2024-01-13 DIAGNOSIS — S81809A Unspecified open wound, unspecified lower leg, initial encounter: Secondary | ICD-10-CM | POA: Diagnosis not present

## 2024-01-13 DIAGNOSIS — N319 Neuromuscular dysfunction of bladder, unspecified: Secondary | ICD-10-CM | POA: Diagnosis not present

## 2024-01-14 DIAGNOSIS — N312 Flaccid neuropathic bladder, not elsewhere classified: Secondary | ICD-10-CM | POA: Diagnosis not present

## 2024-01-14 DIAGNOSIS — J439 Emphysema, unspecified: Secondary | ICD-10-CM | POA: Diagnosis not present

## 2024-01-14 DIAGNOSIS — J8410 Pulmonary fibrosis, unspecified: Secondary | ICD-10-CM | POA: Diagnosis not present

## 2024-01-14 DIAGNOSIS — I639 Cerebral infarction, unspecified: Secondary | ICD-10-CM | POA: Diagnosis not present

## 2024-01-14 DIAGNOSIS — L89154 Pressure ulcer of sacral region, stage 4: Secondary | ICD-10-CM | POA: Diagnosis not present

## 2024-01-14 DIAGNOSIS — L89626 Pressure-induced deep tissue damage of left heel: Secondary | ICD-10-CM | POA: Diagnosis not present

## 2024-01-14 DIAGNOSIS — E440 Moderate protein-calorie malnutrition: Secondary | ICD-10-CM | POA: Diagnosis not present

## 2024-01-14 DIAGNOSIS — J449 Chronic obstructive pulmonary disease, unspecified: Secondary | ICD-10-CM | POA: Diagnosis not present

## 2024-01-14 DIAGNOSIS — I679 Cerebrovascular disease, unspecified: Secondary | ICD-10-CM | POA: Diagnosis not present

## 2024-01-14 DIAGNOSIS — G35 Multiple sclerosis: Secondary | ICD-10-CM | POA: Diagnosis not present

## 2024-01-14 DIAGNOSIS — M4628 Osteomyelitis of vertebra, sacral and sacrococcygeal region: Secondary | ICD-10-CM | POA: Diagnosis not present

## 2024-01-14 DIAGNOSIS — R52 Pain, unspecified: Secondary | ICD-10-CM | POA: Diagnosis not present

## 2024-01-15 DIAGNOSIS — L89310 Pressure ulcer of right buttock, unstageable: Secondary | ICD-10-CM | POA: Diagnosis not present

## 2024-01-15 DIAGNOSIS — M6281 Muscle weakness (generalized): Secondary | ICD-10-CM | POA: Diagnosis not present

## 2024-01-15 DIAGNOSIS — R159 Full incontinence of feces: Secondary | ICD-10-CM | POA: Diagnosis not present

## 2024-01-15 DIAGNOSIS — G35 Multiple sclerosis: Secondary | ICD-10-CM | POA: Diagnosis not present

## 2024-01-15 DIAGNOSIS — L8994 Pressure ulcer of unspecified site, stage 4: Secondary | ICD-10-CM | POA: Diagnosis not present

## 2024-01-15 DIAGNOSIS — L89510 Pressure ulcer of right ankle, unstageable: Secondary | ICD-10-CM | POA: Diagnosis not present

## 2024-01-15 DIAGNOSIS — N319 Neuromuscular dysfunction of bladder, unspecified: Secondary | ICD-10-CM | POA: Diagnosis not present

## 2024-01-15 DIAGNOSIS — L89620 Pressure ulcer of left heel, unstageable: Secondary | ICD-10-CM | POA: Diagnosis not present

## 2024-01-15 DIAGNOSIS — S71009A Unspecified open wound, unspecified hip, initial encounter: Secondary | ICD-10-CM | POA: Diagnosis not present

## 2024-01-15 DIAGNOSIS — L89154 Pressure ulcer of sacral region, stage 4: Secondary | ICD-10-CM | POA: Diagnosis not present

## 2024-01-15 DIAGNOSIS — S81809A Unspecified open wound, unspecified lower leg, initial encounter: Secondary | ICD-10-CM | POA: Diagnosis not present

## 2024-01-15 DIAGNOSIS — G894 Chronic pain syndrome: Secondary | ICD-10-CM | POA: Diagnosis not present

## 2024-01-15 DIAGNOSIS — I10 Essential (primary) hypertension: Secondary | ICD-10-CM | POA: Diagnosis not present

## 2024-01-18 DIAGNOSIS — I129 Hypertensive chronic kidney disease with stage 1 through stage 4 chronic kidney disease, or unspecified chronic kidney disease: Secondary | ICD-10-CM | POA: Diagnosis not present

## 2024-01-18 DIAGNOSIS — L8994 Pressure ulcer of unspecified site, stage 4: Secondary | ICD-10-CM | POA: Diagnosis not present

## 2024-01-18 DIAGNOSIS — N2581 Secondary hyperparathyroidism of renal origin: Secondary | ICD-10-CM | POA: Diagnosis not present

## 2024-01-18 DIAGNOSIS — M6281 Muscle weakness (generalized): Secondary | ICD-10-CM | POA: Diagnosis not present

## 2024-01-18 DIAGNOSIS — N189 Chronic kidney disease, unspecified: Secondary | ICD-10-CM | POA: Diagnosis not present

## 2024-01-18 DIAGNOSIS — N319 Neuromuscular dysfunction of bladder, unspecified: Secondary | ICD-10-CM | POA: Diagnosis not present

## 2024-01-18 DIAGNOSIS — N139 Obstructive and reflux uropathy, unspecified: Secondary | ICD-10-CM | POA: Diagnosis not present

## 2024-01-18 DIAGNOSIS — N261 Atrophy of kidney (terminal): Secondary | ICD-10-CM | POA: Diagnosis not present

## 2024-01-18 DIAGNOSIS — D631 Anemia in chronic kidney disease: Secondary | ICD-10-CM | POA: Diagnosis not present

## 2024-01-18 DIAGNOSIS — S81809A Unspecified open wound, unspecified lower leg, initial encounter: Secondary | ICD-10-CM | POA: Diagnosis not present

## 2024-01-18 DIAGNOSIS — G35 Multiple sclerosis: Secondary | ICD-10-CM | POA: Diagnosis not present

## 2024-01-18 DIAGNOSIS — N184 Chronic kidney disease, stage 4 (severe): Secondary | ICD-10-CM | POA: Diagnosis not present

## 2024-01-18 DIAGNOSIS — S71009A Unspecified open wound, unspecified hip, initial encounter: Secondary | ICD-10-CM | POA: Diagnosis not present

## 2024-01-18 DIAGNOSIS — I10 Essential (primary) hypertension: Secondary | ICD-10-CM | POA: Diagnosis not present

## 2024-01-20 DIAGNOSIS — L8994 Pressure ulcer of unspecified site, stage 4: Secondary | ICD-10-CM | POA: Diagnosis not present

## 2024-01-20 DIAGNOSIS — S71009A Unspecified open wound, unspecified hip, initial encounter: Secondary | ICD-10-CM | POA: Diagnosis not present

## 2024-01-20 DIAGNOSIS — I10 Essential (primary) hypertension: Secondary | ICD-10-CM | POA: Diagnosis not present

## 2024-01-20 DIAGNOSIS — R159 Full incontinence of feces: Secondary | ICD-10-CM | POA: Diagnosis not present

## 2024-01-20 DIAGNOSIS — L89620 Pressure ulcer of left heel, unstageable: Secondary | ICD-10-CM | POA: Diagnosis not present

## 2024-01-20 DIAGNOSIS — M6281 Muscle weakness (generalized): Secondary | ICD-10-CM | POA: Diagnosis not present

## 2024-01-20 DIAGNOSIS — L89310 Pressure ulcer of right buttock, unstageable: Secondary | ICD-10-CM | POA: Diagnosis not present

## 2024-01-20 DIAGNOSIS — S81809A Unspecified open wound, unspecified lower leg, initial encounter: Secondary | ICD-10-CM | POA: Diagnosis not present

## 2024-01-20 DIAGNOSIS — N319 Neuromuscular dysfunction of bladder, unspecified: Secondary | ICD-10-CM | POA: Diagnosis not present

## 2024-01-20 DIAGNOSIS — L89510 Pressure ulcer of right ankle, unstageable: Secondary | ICD-10-CM | POA: Diagnosis not present

## 2024-01-20 DIAGNOSIS — G35 Multiple sclerosis: Secondary | ICD-10-CM | POA: Diagnosis not present

## 2024-01-20 DIAGNOSIS — G894 Chronic pain syndrome: Secondary | ICD-10-CM | POA: Diagnosis not present

## 2024-01-20 DIAGNOSIS — L89154 Pressure ulcer of sacral region, stage 4: Secondary | ICD-10-CM | POA: Diagnosis not present

## 2024-01-22 DIAGNOSIS — L89154 Pressure ulcer of sacral region, stage 4: Secondary | ICD-10-CM | POA: Diagnosis not present

## 2024-01-22 DIAGNOSIS — S71009A Unspecified open wound, unspecified hip, initial encounter: Secondary | ICD-10-CM | POA: Diagnosis not present

## 2024-01-22 DIAGNOSIS — R52 Pain, unspecified: Secondary | ICD-10-CM | POA: Diagnosis not present

## 2024-01-22 DIAGNOSIS — J449 Chronic obstructive pulmonary disease, unspecified: Secondary | ICD-10-CM | POA: Diagnosis not present

## 2024-01-22 DIAGNOSIS — J8410 Pulmonary fibrosis, unspecified: Secondary | ICD-10-CM | POA: Diagnosis not present

## 2024-01-22 DIAGNOSIS — I10 Essential (primary) hypertension: Secondary | ICD-10-CM | POA: Diagnosis not present

## 2024-01-22 DIAGNOSIS — I679 Cerebrovascular disease, unspecified: Secondary | ICD-10-CM | POA: Diagnosis not present

## 2024-01-22 DIAGNOSIS — E440 Moderate protein-calorie malnutrition: Secondary | ICD-10-CM | POA: Diagnosis not present

## 2024-01-22 DIAGNOSIS — M6281 Muscle weakness (generalized): Secondary | ICD-10-CM | POA: Diagnosis not present

## 2024-01-22 DIAGNOSIS — L89626 Pressure-induced deep tissue damage of left heel: Secondary | ICD-10-CM | POA: Diagnosis not present

## 2024-01-22 DIAGNOSIS — I639 Cerebral infarction, unspecified: Secondary | ICD-10-CM | POA: Diagnosis not present

## 2024-01-22 DIAGNOSIS — L8994 Pressure ulcer of unspecified site, stage 4: Secondary | ICD-10-CM | POA: Diagnosis not present

## 2024-01-22 DIAGNOSIS — N319 Neuromuscular dysfunction of bladder, unspecified: Secondary | ICD-10-CM | POA: Diagnosis not present

## 2024-01-22 DIAGNOSIS — G35 Multiple sclerosis: Secondary | ICD-10-CM | POA: Diagnosis not present

## 2024-01-22 DIAGNOSIS — S81809A Unspecified open wound, unspecified lower leg, initial encounter: Secondary | ICD-10-CM | POA: Diagnosis not present

## 2024-01-22 DIAGNOSIS — J439 Emphysema, unspecified: Secondary | ICD-10-CM | POA: Diagnosis not present

## 2024-01-22 DIAGNOSIS — M4628 Osteomyelitis of vertebra, sacral and sacrococcygeal region: Secondary | ICD-10-CM | POA: Diagnosis not present

## 2024-01-22 DIAGNOSIS — N312 Flaccid neuropathic bladder, not elsewhere classified: Secondary | ICD-10-CM | POA: Diagnosis not present

## 2024-01-25 DIAGNOSIS — S71009A Unspecified open wound, unspecified hip, initial encounter: Secondary | ICD-10-CM | POA: Diagnosis not present

## 2024-01-25 DIAGNOSIS — N319 Neuromuscular dysfunction of bladder, unspecified: Secondary | ICD-10-CM | POA: Diagnosis not present

## 2024-01-25 DIAGNOSIS — I10 Essential (primary) hypertension: Secondary | ICD-10-CM | POA: Diagnosis not present

## 2024-01-25 DIAGNOSIS — S81809A Unspecified open wound, unspecified lower leg, initial encounter: Secondary | ICD-10-CM | POA: Diagnosis not present

## 2024-01-25 DIAGNOSIS — G35 Multiple sclerosis: Secondary | ICD-10-CM | POA: Diagnosis not present

## 2024-01-25 DIAGNOSIS — M6281 Muscle weakness (generalized): Secondary | ICD-10-CM | POA: Diagnosis not present

## 2024-01-25 DIAGNOSIS — L8994 Pressure ulcer of unspecified site, stage 4: Secondary | ICD-10-CM | POA: Diagnosis not present

## 2024-01-26 ENCOUNTER — Telehealth: Payer: Self-pay | Admitting: *Deleted

## 2024-01-26 NOTE — Progress Notes (Signed)
 Complex Care Management Care Guide Note  01/26/2024 Name: VIVIANA BILODEAU MRN: [id number] DOB: 1941-02-06  ARYAAN CAMPESE is a 83 y.o. year old male who is a primary care patient of Omie Bickers, MD and is actively engaged with the care management team. I reached out to Francoise Ishihara by phone today to assist with re-scheduling  with the RN Case Manager.  Follow up plan: Unsuccessful telephone outreach attempt made. A HIPAA compliant phone message was left for the patient providing contact information and requesting a return call.  Kandis Ormond, CMA [place]  [facility], [facility] Guide Direct Dial: [phone]  Fax: [phone] Website: [email or web address]

## 2024-01-27 DIAGNOSIS — L8994 Pressure ulcer of unspecified site, stage 4: Secondary | ICD-10-CM | POA: Diagnosis not present

## 2024-01-27 DIAGNOSIS — M6281 Muscle weakness (generalized): Secondary | ICD-10-CM | POA: Diagnosis not present

## 2024-01-27 DIAGNOSIS — N319 Neuromuscular dysfunction of bladder, unspecified: Secondary | ICD-10-CM | POA: Diagnosis not present

## 2024-01-27 DIAGNOSIS — M4802 Spinal stenosis, cervical region: Secondary | ICD-10-CM | POA: Diagnosis not present

## 2024-01-27 DIAGNOSIS — M5416 Radiculopathy, lumbar region: Secondary | ICD-10-CM | POA: Diagnosis not present

## 2024-01-27 DIAGNOSIS — I10 Essential (primary) hypertension: Secondary | ICD-10-CM | POA: Diagnosis not present

## 2024-01-27 DIAGNOSIS — S81809A Unspecified open wound, unspecified lower leg, initial encounter: Secondary | ICD-10-CM | POA: Diagnosis not present

## 2024-01-27 DIAGNOSIS — S71009A Unspecified open wound, unspecified hip, initial encounter: Secondary | ICD-10-CM | POA: Diagnosis not present

## 2024-01-27 DIAGNOSIS — G35 Multiple sclerosis: Secondary | ICD-10-CM | POA: Diagnosis not present

## 2024-01-28 ENCOUNTER — Encounter: Payer: Self-pay | Admitting: Neurology

## 2024-01-28 ENCOUNTER — Ambulatory Visit: Payer: Medicare Other | Admitting: Neurology

## 2024-01-28 VITALS — BP 135/75 | HR 81 | Ht 65.0 in | Wt 106.0 lb

## 2024-01-28 DIAGNOSIS — I639 Cerebral infarction, unspecified: Secondary | ICD-10-CM

## 2024-01-28 DIAGNOSIS — G35 Multiple sclerosis: Secondary | ICD-10-CM | POA: Diagnosis not present

## 2024-01-28 DIAGNOSIS — J449 Chronic obstructive pulmonary disease, unspecified: Secondary | ICD-10-CM | POA: Diagnosis not present

## 2024-01-28 DIAGNOSIS — M4628 Osteomyelitis of vertebra, sacral and sacrococcygeal region: Secondary | ICD-10-CM | POA: Diagnosis not present

## 2024-01-28 DIAGNOSIS — I679 Cerebrovascular disease, unspecified: Secondary | ICD-10-CM | POA: Diagnosis not present

## 2024-01-28 DIAGNOSIS — L89626 Pressure-induced deep tissue damage of left heel: Secondary | ICD-10-CM | POA: Diagnosis not present

## 2024-01-28 DIAGNOSIS — I10 Essential (primary) hypertension: Secondary | ICD-10-CM

## 2024-01-28 DIAGNOSIS — R52 Pain, unspecified: Secondary | ICD-10-CM | POA: Diagnosis not present

## 2024-01-28 DIAGNOSIS — G894 Chronic pain syndrome: Secondary | ICD-10-CM | POA: Diagnosis not present

## 2024-01-28 DIAGNOSIS — N312 Flaccid neuropathic bladder, not elsewhere classified: Secondary | ICD-10-CM | POA: Diagnosis not present

## 2024-01-28 DIAGNOSIS — J439 Emphysema, unspecified: Secondary | ICD-10-CM | POA: Diagnosis not present

## 2024-01-28 DIAGNOSIS — L89310 Pressure ulcer of right buttock, unstageable: Secondary | ICD-10-CM | POA: Diagnosis not present

## 2024-01-28 DIAGNOSIS — R159 Full incontinence of feces: Secondary | ICD-10-CM | POA: Diagnosis not present

## 2024-01-28 DIAGNOSIS — M6281 Muscle weakness (generalized): Secondary | ICD-10-CM | POA: Diagnosis not present

## 2024-01-28 DIAGNOSIS — L89154 Pressure ulcer of sacral region, stage 4: Secondary | ICD-10-CM | POA: Diagnosis not present

## 2024-01-28 DIAGNOSIS — I6310 Cerebral infarction due to embolism of unspecified precerebral artery: Secondary | ICD-10-CM | POA: Diagnosis not present

## 2024-01-28 DIAGNOSIS — L89620 Pressure ulcer of left heel, unstageable: Secondary | ICD-10-CM | POA: Diagnosis not present

## 2024-01-28 DIAGNOSIS — L89510 Pressure ulcer of right ankle, unstageable: Secondary | ICD-10-CM | POA: Diagnosis not present

## 2024-01-28 DIAGNOSIS — E440 Moderate protein-calorie malnutrition: Secondary | ICD-10-CM | POA: Diagnosis not present

## 2024-01-28 DIAGNOSIS — J8410 Pulmonary fibrosis, unspecified: Secondary | ICD-10-CM | POA: Diagnosis not present

## 2024-01-28 NOTE — Patient Instructions (Signed)
 Order cardiac monitor study to check for AFIB  Continue aspirin  81 mg daily for secondary stroke prevention  Strict management of vascular risk factors with a goal BP less than 130/90, A1c less than 7.0, LDL less than 70 for secondary stroke prevention Continue rehab therapy

## 2024-01-28 NOTE — Progress Notes (Signed)
 PATIENT: Jordan Ward DOB: Dec 07, 1940  REASON FOR VISIT: Follow up for MS HISTORY FROM: Patient and his wife  PRIMARY NEUROLOGIST: Dr. Janett Medin   HISTORY OF PRESENT ILLNESS: Today 01/28/24 Update 01/28/24 SS: At last visit with Dr. Janett Medin, ordered spine imaging to evaluate leg weakness.  Recommend to continue aspirin  alone for secondary stroke prevention.  Ordered 30-day cardiac monitor.  MRI CERVICAL SPINE:   1. Motion degraded exam. Within this limitation, there is severe  spinal canal stenosis at C3-C4, C4-C5, and C52-C6. No evidence of  cord compression or cord signal abnormality.  2. Severe neural foraminal stenosis on the left at C2-C3,  bilaterally at C3-C4, C4-C5, and C6-C7.  3. Asymmetrically decreased/absent flow void in the right vertebral  artery. This is suspicious for age indeterminate occlusion of the  right vertebral artery. Recommend further evaluation with a CTA  neck.   MRI THORACIC SPINE:   1. Motion degraded exam.  2. No evidence of high-grade spinal canal or neural foraminal  stenosis in the thoracic spine.  3. Consolidative opacity in the right lung base is nonspecific and  could represent atelectasis or infection.   MRI LUMBAR SPINE:   1. Motion degraded exam.  2. Multilevel degenerative changes of the lumbar spine with  mild-to-moderate spinal canal narrowing at L4-L5.  3. Moderate to severe neural foraminal narrowing at L5-S1 (right)  and L4-L5 (left).   Based on above was referred to neurosurgery, saw Dr. Gwendlyn Lemmings, was not a candidate for surgery, he had dropped right foot. Patient living at [facility], will be coming home end of the month. He has a stage 4 sacral wound. He went to several rehab facilities, including [facility]. He got a brace to his right foot. Follow up 6 weeks with Dr. Gwendlyn Lemmings. Remains on aspirin  81 mg daily. He is working with therapy with walker. Continues with suprapubic cath and nephrostomy tube. Having trouble with memory,   short term, getting confused. Urine smell noted today.   10/19/23 Dr. Janett Medin: Jordan Ward is a 83 year old Caucasian male seen today for office follow-up visit following hospital admission in December 2024 for recurrent UTI during which was found to have silent bilateral strokes on MRI.  History is obtained from the patient and his wife as well as review of electronic medical records.  I personally reviewed pertinent available imaging films in PACS.  He has past medical history of remote multiple sclerosis, chronic kidney disease stage IV, neurogenic bladder with suprapubic cystostomy catheter, left ureter obstruction with nephrostomy tube, COPD, emphysema, home oxygen , hypothyroidism, dysphagia, nonhealing wound right ankle, pericerebral arterial disease, heart failure, cerebrovascular disease.  Patient presented on 09/29/2023 with several days of feeling short of breath, coughing and with increased left sided weakness and slurred speech.  He was admitted with COVID-pneumonia and also found to have strep pneumonia bacteremia and was treated with 10-day course of IV antibiotics.  MRI scan of the brain showed tiny bilateral occipital white matter and left cerebellar infarcts which were felt by teleneurologist to be asymptomatic and not explaining his symptoms.  Patient was transferred to skilled nursing facility for rehab but he states he is speech and swallowing have improved a bit but left leg weakness has not improved at all.  He has significant weakness in isolation in his left leg but not much weakness in his left arm face.  He does have remote history of multiple sclerosis and family is wondering if this could be related to his MS since the  infarct seen on MRI could not explain such significant new left leg weakness.  Patient was discharged on dual antiplatelet therapy as his MR angiogram of the brain had shown occluded right vertebral artery and multifocal atherosclerotic changes involving moderate stenosis of  left cavernous ICA, left anterior cerebral artery in the A2 segment and severe stenosis of left posterior cerebral artery and the P3/P4 segments.  Echocardiogram had shown diminished ejection fraction of 30 to 35% with global hypokinesis.  LDL cholesterol 60 mg percent and hemoglobin A1c was 6.1.  Patient is presently at [facility] rehabilitation center is getting daily physical occupational therapy but he is not able to walk and needs two-person assist to even stand.  He is not able to take any steps yet.   10/02/22 SS: Hospitalized 08/24/22-08/30/22 for significant UTI has left nephrostomy tube in place, suprapubic catheter. That morning his hands and hands were shaky, similar to rigors in the past.  CT head 08/24/22 showed stability of advanced small vessel disease. Did 2 weeks of Rehab at [facility] in [place]. Improved his strength. Using walker, 2 wheels on front, tennis balls on back legs. Rolling walking is too fast. Remains on Valium  for muscle spasms, twice daily. Cut back on hydrocodone  to 1 tablet daily. Comes from PCP. His wife has mentioned some mood issues, he gets frustrated, can be short. Has a bad attitude. He does his own ADLs. He doesn't drive.   Update 11/20/21 SS: Jordan Ward here today with follow-up for history of MS, not on any disease modifying agents.  History of tremor at the time of illness on 2 separate hospital admissions.  Felt to be rigors related to the infection. Has nephrostomy tube now. Has poor energy, he chooses not to get around per his wife, mostly sits on his phone. Uses walker, 2 near falls, his wife has caught him twice. Uses oxygen  at night. Has OSA, stopped using CPAP. Takes Valium  5 mg twice daily for muscle spasms in legs. His wife thinks he is doing fairly well. On hydrocodone  from PCP for chronic pain. Reviewed drug registry last filled Valium  11/07/21. They have to take a Carloyn Chi to [place], his wife doesn't drive here.   HISTORY  03/07/2021 Dr. Tilda Fogo: Jordan Ward is a  83 year old right-handed white male with a history of multiple sclerosis.  He currently is not on any disease modifying agents.  The patient has a neurogenic bladder, he has a suprapubic catheter in place, he has had problems with hydronephrosis and has had stent placements previously.  He comes in for an evaluation of tremor.  He has had episodes of severe tremor that have occurred on 2 occasions.  The patient was admitted to the hospital on 05 September 2020 with a fever and left flank pain and tachycardia.  He had a white count of 18.4..  He had bacterial sepsis with Enterococcus and Pseudomonas.  He had at least a 2-day event of severe shaking chills during this event.  He was admitted to the hospital again on 13 Feb 2021 with a similar issue.  He was running a low-grade temperature, but the blood cultures were negative at that time.  The patient was treated with antibiotics.  He had 3 days of shaking chills once again.  The patient had difficulty with speaking and walking during these hard shaking chills.  After the infection was treated on both occasions, the tremors disappeared.  The patient has had ongoing left flank pain.  He is sent to this office  for further evaluation.  He had COVID and October 2021, he has had significant fatigue issues and decline in his ability to ambulate since that time.  He has been on oxygen  continuously until just recently when he was converted to oxygen  just in the evening.  He is now walking with a walker since the COVID infection.  He never got vaccinated for COVID.  He comes here for further evaluation.  He denies any new numbness or weakness of the face, arms, legs.  He denies any vision changes or difficulty with swallowing.  He is getting physical therapy in the home environment.  REVIEW OF SYSTEMS: Out of a complete 14 system review of symptoms, the patient complains only of the following symptoms, and all other reviewed systems are negative.  See  HPI  ALLERGIES: Allergies  Allergen Reactions   Tetracyclines & Related Anaphylaxis and Rash   Cipro  [Ciprofloxacin  Hcl] Other (See Comments)    "[name] out in the floor"   Ciprofloxacin      Trouble swallowing according to wife  Other Reaction(s): Other (See Comments)  "[name] out in the floor"    HOME MEDICATIONS: Outpatient Medications Prior to Visit  Medication Sig Dispense Refill   amLODipine  (NORVASC ) 10 MG tablet Take 5 mg by mouth daily.     ascorbic acid  (VITAMIN C) 500 MG tablet Take 500 mg by mouth daily.     aspirin  81 MG chewable tablet Chew 81 mg by mouth daily.     diazepam  (VALIUM ) 5 MG tablet Take 1 tablet (5 mg total) by mouth 2 (two) times daily. 6 tablet 0   dronabinol (MARINOL) 2.5 MG capsule Take 2.5 mg by mouth 2 (two) times daily before a meal.     HYDROcodone -acetaminophen  (NORCO) 7.5-325 MG tablet Take 1 tablet by mouth 2 (two) times daily. Max APAP 3 GM IN 24 HOURS FROM ALL SOURCES 6 tablet 0   levothyroxine  (SYNTHROID ) 75 MCG tablet Take 75 mcg by mouth in the morning.     melatonin 3 MG TABS tablet Take 3 mg by mouth at bedtime.     methocarbamol (ROBAXIN) 500 MG tablet Take 500 mg by mouth 4 (four) times daily.     mirabegron  ER (MYRBETRIQ ) 50 MG TB24 tablet Take 1 tablet (50 mg total) by mouth daily. 30 tablet 2   Multiple Vitamin (MULTIVITAMIN) tablet Take 1 tablet by mouth daily.     oxybutynin  (DITROPAN ) 5 MG tablet Take 5 mg by mouth 3 (three) times daily.     OXYGEN  Inhale 1 L into the lungs every evening.     pantoprazole  (PROTONIX ) 40 MG tablet Take 1 tablet (40 mg total) by mouth daily. 30 tablet 2   Potassium Bicarb-Citric Acid 20 MEQ TBEF Take by mouth.     potassium chloride  SA (KLOR-CON ) 20 MEQ tablet Take 20 mEq by mouth daily.     senna (SENOKOT) 8.6 MG tablet Take 1 tablet by mouth daily.     thiamine  (VITAMIN B-1) 100 MG tablet Take 1 tablet (100 mg total) by mouth daily. 30 tablet 0   thiamine  (VITAMIN B1) 100 MG tablet Take 100 mg by  mouth daily.     zinc  gluconate 50 MG tablet Take 50 mg by mouth daily.     zinc  sulfate, 50mg  elemental zinc , 220 (50 Zn) MG capsule Take 220 mg by mouth daily.     acetaminophen  (TYLENOL ) 325 MG tablet Take 650 mg by mouth every 6 (six) hours as needed for fever, mild pain (  pain score 1-3) or moderate pain (pain score 4-6). (Patient not taking: Reported on 01/28/2024)     No facility-administered medications prior to visit.    PAST MEDICAL HISTORY: Past Medical History:  Diagnosis Date   Anemia    Arthritis    Back pain, chronic    Bilateral carotid bruits    C. difficile colitis 09/2011   CAD (coronary artery disease)    Carotid artery stenosis    Cerebrovascular disease    Cerebrovascular disease 08/17/2018   Colon polyps    Complication of cystostomy catheter, initial encounter (HCC) 04/20/2020   Dyslipidemia    Dysphagia 10/07/2011   Encephalopathy    Gait disorder    HA (headache)    High grade dysplasia in colonic adenoma 09/2005   History of kidney stones    HTN (hypertension), malignant 10/06/2011   Hypernatremia    Hypokalemia    Hypothyroidism 10/08/2011   Insomnia    Junctional rhythm    Kidney stones    MS (multiple sclerosis) (HCC)    Nausea and vomiting 03/02/2020   Neuromuscular disorder (HCC)    MS   OSA (obstructive sleep apnea)    Paroxysmal atrial tachycardia (HCC)    Peripheral vascular disease (HCC)    Pneumonia 4 yrs ago   Pulmonary fibrosis (HCC) 10/06/2011   Pulmonary nodule 10/08/2011   PVD (peripheral vascular disease) (HCC)    Sacral ulcer (HCC)    Sleep apnea    cannot tolerate   Stroke (HCC)    left sided weakness   Suprapubic catheter (HCC)    TIA (transient ischemic attack)    Tremors of nervous system 10/08/2011   Urinary tract infection    Ventral hernia without obstruction or gangrene     Large 8X9cm ventral hernia with loss of domain. CT reads report as diastasis recti with herniation or diastasis recti.  Dr. Collene Dawson,  Surgery, reviewed CT with radiology and there is herniation with only hernia sac or peritoneum over the bowel and large separation of the rectus muslce (i.e. diastasis recti aka loss of domain).  No surgical intervention recommended given size, age, and health.     PAST SURGICAL HISTORY: Past Surgical History:  Procedure Laterality Date   APPENDECTOMY  09/2005   at time of left hemicolectomy   BACK SURGERY  1976/1979   lower   BILIARY DILATION N/A 03/03/2020   Procedure: BILIARY DILATION;  Surgeon: Ruby Corporal, MD;  [facility];  Service: Endoscopy;  Laterality: N/A;   CATARACT EXTRACTION W/PHACO Right 03/08/2018   Procedure: CATARACT EXTRACTION PHACO AND INTRAOCULAR LENS PLACEMENT RIGHT EYE;  Surgeon: Anner Kill, MD;  Location: [facility];  Service: Ophthalmology;  Laterality: Right;  CDE: 8.86   CATARACT EXTRACTION W/PHACO Left 04/05/2018   Procedure: CATARACT EXTRACTION PHACO AND INTRAOCULAR LENS PLACEMENT (IOC);  Surgeon: Anner Kill, MD;  Location: [facility];  Service: Ophthalmology;  Laterality: Left;  CDE: 7.36   CENTRAL LINE INSERTION-TUNNELED Right 09/11/2020   Procedure: PLACEMENT OF TUNNELED CENTRAL LINE INTO JUGULAR VEIN;  Surgeon: Awilda Bogus, MD;  Location: [facility];  Service: General;  Laterality: Right;   CHOLECYSTECTOMY     Dr. Felipe Horton   COLON SURGERY  09/2005   Fleishman: four tubular adenomas, large adenomatous polyp with HIGH GRADE dysplasia   COLONOSCOPY  11/2004   Dr. Ileana Mallard sessile polyp splenic flexure, 10mm sessile polyp desc colon, tubulovillous adenoma (bx not removed)   COLONOSCOPY  01/2005   poor prep, polyp could not  be found   COLONOSCOPY  05/2005   with EMR, polypectomy Dr. Marylin So, bx showed high grade dysplasia, partially resected   COLONOSCOPY  09/2005   Dr. Hercules Lombard, [place] ink tattooing, four villous colon polyp (3 had been missed on previous colonoscopies due to limitations of procedures   COLONOSCOPY  09/2006   normal TI, no polyps    COLONOSCOPY  10/2007   Dr. Merrill Abide distal mammillations, benign bx, normal TI, random bx neg for microscopic colitis   CYSTOSCOPY W/ URETERAL STENT PLACEMENT Left 09/05/2020   Procedure: CYSTOSCOPY WITH RETROGRADE PYELOGRAM/URETERAL STENT PLACEMENT;  Surgeon: Andrez Banker, MD;  Location: [facility];  Service: Urology;  Laterality: Left;   CYSTOSCOPY WITH LITHOLAPAXY N/A 07/27/2018   Procedure: CYSTOSCOPY WITH LITHOLAPAXY VIA  SUPRAPUBIC TUBE;  Surgeon: Trent Frizzle, MD;  Location: [facility];  Service: Urology;  Laterality: N/A;   CYSTOSCOPY WITH RETROGRADE PYELOGRAM, URETEROSCOPY AND STENT PLACEMENT Left 06/09/2017   Procedure: CYSTOSCOPY WITH LEFT RETROGRADE PYELOGRAM, LEFT URETEROSCOPY, LEFT URETEROSCOPIC STONE EXTRACTION, LEFT URETERAL STENT PLACEMENT;  Surgeon: Trent Frizzle, MD;  Location: [facility];  Service: Urology;  Laterality: Left;   CYSTOSCOPY/URETEROSCOPY/HOLMIUM LASER/STENT PLACEMENT Left 05/22/2020   Procedure: CYSTOSCOPY/URETEROSCOPY/STENT PLACEMENT;  Surgeon: Trent Frizzle, MD;  Location: [facility];  Service: Urology;  Laterality: Left;   ERCP N/A 03/03/2020   Procedure: ENDOSCOPIC RETROGRADE CHOLANGIOPANCREATOGRAPHY (ERCP);  Surgeon: Ruby Corporal, MD;  [facility];  Service: Endoscopy;  Laterality: N/A;   ERCP N/A 04/20/2020   Procedure: ENDOSCOPIC RETROGRADE CHOLANGIOPANCREATOGRAPHY (ERCP);  Surgeon: Ruby Corporal, MD;  [facility];  Service: Endoscopy;  Laterality: N/A;  to be done at 7:30am in OR   GASTROINTESTINAL STENT REMOVAL N/A 04/20/2020   Procedure: STENT REMOVAL;  Surgeon: Ruby Corporal, MD;  [facility];  Service: Endoscopy;  Laterality: N/A;   INGUINAL HERNIA REPAIR  1971   bilateral   INSERTION OF SUPRAPUBIC CATHETER  06/09/2017   Procedure: EXCHANGE OF SUPRAPUBIC CATHETER;  Surgeon: Trent Frizzle, MD;  Location: [facility];  Service: Urology;;   INSERTION OF SUPRAPUBIC CATHETER  05/22/2020   Procedure:  SUPRAPUBIC CATHETER EXCHANGE;  Surgeon: Trent Frizzle, MD;  Location: [facility];  Service: Urology;;   IR NEPHROSTOMY EXCHANGE LEFT  03/25/2021   IR NEPHROSTOMY EXCHANGE LEFT  05/20/2021   IR NEPHROSTOMY EXCHANGE LEFT  07/13/2021   IR NEPHROSTOMY EXCHANGE LEFT  09/09/2021   IR NEPHROSTOMY EXCHANGE LEFT  11/04/2021   IR NEPHROSTOMY EXCHANGE LEFT  12/23/2021   IR NEPHROSTOMY EXCHANGE LEFT  02/17/2022   IR NEPHROSTOMY EXCHANGE LEFT  04/14/2022   IR NEPHROSTOMY EXCHANGE LEFT  06/09/2022   IR NEPHROSTOMY EXCHANGE LEFT  07/28/2022   IR NEPHROSTOMY EXCHANGE LEFT  09/09/2022   IR NEPHROSTOMY EXCHANGE LEFT  11/04/2022   IR NEPHROSTOMY EXCHANGE LEFT  12/30/2022   IR NEPHROSTOMY EXCHANGE LEFT  02/25/2023   IR NEPHROSTOMY EXCHANGE LEFT  03/31/2023   IR NEPHROSTOMY EXCHANGE LEFT  05/26/2023   IR NEPHROSTOMY EXCHANGE LEFT  07/21/2023   IR NEPHROSTOMY EXCHANGE LEFT  09/15/2023   IR NEPHROSTOMY EXCHANGE LEFT  12/22/2023   IR NEPHROSTOMY PLACEMENT LEFT  05/26/2017   IR NEPHROSTOMY PLACEMENT LEFT  05/02/2020   IR NEPHROSTOMY PLACEMENT LEFT  02/15/2021   IR NEPHROSTOMY TUBE CHANGE  10/29/2023   IR URETERAL STENT PERC REMOVAL MOD SED  03/25/2021   KIDNEY STONE SURGERY  09/13/2015   LITHOTRIPSY N/A 03/03/2020   Procedure: MECHANICAL LITHOTRIPSY WITH REMOVAL OF MULTIPLE STONE FRAGMENTS;  Surgeon:  Ruby Corporal, MD;  [facility];  Service: Endoscopy;  Laterality: N/A;   NEPHROLITHOTOMY Left 09/13/2015   Procedure: LEFT PERCUTANEOUS NEPHROLITHOTOMY ;  Surgeon: Trent Frizzle, MD;  Location: [facility];  Service: Urology;  Laterality: Left;   NEPHROSTOMY TUBE REMOVAL  05/22/2020   Procedure: NEPHROSTOMY TUBE REMOVAL;  Surgeon: Trent Frizzle, MD;  Location: [facility];  Service: Urology;;   [facility] REMOVAL Right 09/20/2020   Procedure: MINOR REMOVAL CENTRAL LINE;  Surgeon: Awilda Bogus, MD;  Location: [facility];  Service: General;  Laterality: Right;  Pt to arrive at 7:30am for procedure   REMOVAL OF STONES  N/A 04/20/2020   Procedure: REMOVAL OF STONES;  Surgeon: Ruby Corporal, MD;  [facility];  Service: Endoscopy;  Laterality: N/A;   SPHINCTEROTOMY  03/03/2020   Procedure: BILLARY SPHINCTEROTOMY;  Surgeon: Ruby Corporal, MD;  [facility];  Service: Endoscopy;;   SUPRAPUBIC CATHETER INSERTION      FAMILY HISTORY: Family History  Problem Relation Age of Onset   Cirrhosis Brother        etoh   Stroke Mother 70   Coronary artery disease Father 83   Heart attack Brother    Cancer Sister    Multiple sclerosis Other    Colon cancer Neg Hx     SOCIAL HISTORY: Social History   Socioeconomic History   Marital status: Married    Spouse name: Dayla Eva   Number of children: 2   Years of education: 10   Highest education level: Not on file  Occupational History   Occupation: disability    Comment: [employer]    Employer: RETIRED  Tobacco Use   Smoking status: Former    Current packs/day: 0.00    Average packs/day: 1 pack/day for 25.0 years (25.0 ttl pk-yrs)    Types: Cigarettes    Start date: 03/04/1963    Quit date: 03/03/1988    Years since quitting: 35.9   Smokeless tobacco: Never  Vaping Use   Vaping status: Never Used  Substance and Sexual Activity   Alcohol  use: No   Drug use: No   Sexual activity: Not on file  Other Topics Concern   Not on file  Social History Narrative   Patient is married Dayla Eva) and  Lives w/ wife and daughter Genevia Kern   Drinks 1-2 cups caffeine daily    Patient is left handed.    Social Drivers of Corporate investment banker Strain: Low Risk  (11/03/2023)   Received from [facility]   Overall Financial Resource Strain (CARDIA)    Difficulty of Paying Living Expenses: Not very hard  Food Insecurity: Food Insecurity Present (11/03/2023)   Received from [facility]   Hunger Vital Sign    Worried About Running Out of Food in the Last Year: Sometimes true    Ran Out of Food in the Last Year: Sometimes true   Transportation Needs: No Transportation Needs (11/03/2023)   Received from [facility] - Transportation    Lack of Transportation (Medical): No    Lack of Transportation (Non-Medical): No  Physical Activity: Inactive (11/03/2023)   Received from [facility]   Exercise Vital Sign    Days of Exercise per Week: 0 days    Minutes of Exercise per Session: 0 min  Stress: No Stress Concern Present (11/03/2023)   Received from [facility] -  Occupational Stress Questionnaire    Feeling of Stress : Only a little  Social Connections: Moderately Isolated (11/03/2023)   Received from [facility]   Social Connection and Isolation Panel [NHANES]    Frequency of Communication with Friends and Family: Once a week    Frequency of Social Gatherings with Friends and Family: Once a week    Attends Religious Services: 1 to 4 times per year    Active Member of [employer] or Organizations: No    Attends Banker Meetings: Never    Marital Status: Married  Catering manager Violence: Not At Risk (11/03/2023)   Received from [facility]   Humiliation, Afraid, Rape, and Kick questionnaire    Fear of Current or Ex-Partner: No    Emotionally Abused: No    Physically Abused: No    Sexually Abused: No   PHYSICAL EXAM  Vitals:   01/28/24 1334  BP: 135/75  Pulse: 81  Weight: 106 lb (48.1 kg)  Height: 5\' 5"  (1.651 m)   Body mass index is 17.64 kg/m.  Generalized: Well developed, in no acute distress, elderly male, appears generally deconditioned, thin.  Suprapubic catheter, nephrostomy. Neurological examination  Mentation: Alert oriented to time, place, most history is provided by his wife. Follows all commands, speech is mumbled, seems uncomfortable Cranial nerve II-XII: Pupils were equal round reactive to light. Extraocular movements were full, visual field were full on confrontational test. Facial sensation and strength were  normal. Head turning and shoulder shrug were normal and symmetric. Motor:  3/5 both legs, generally deconditioned. Slight increased tone to both legs Sensory: Decreased soft touch sensation to the left leg. Coordination: Cerebellar testing reveals good finger-nose-finger bilaterally, hard to lift the legs is slow Gait and station: In a wheelchair, cannot stand without assistance  Reflexes: Deep tendon reflexes are symmetric and normal bilaterally.   DIAGNOSTIC DATA (LABS, IMAGING, TESTING) - I reviewed patient records, labs, notes, testing and imaging myself where available.  Lab Results  Component Value Date   WBC 15.5 (H) 10/29/2023   HGB 10.0 (L) 10/29/2023   HCT 32.0 (L) 10/29/2023   MCV 92.2 10/29/2023   PLT 367 10/29/2023      Component Value Date/Time   NA 134 (L) 10/29/2023 0320   NA 136 (A) 09/27/2018 0000   K 3.8 10/29/2023 0320   CL 100 10/29/2023 0320   CO2 22 10/29/2023 0320   GLUCOSE 112 (H) 10/29/2023 0320   BUN 21 10/29/2023 0320   BUN 28 (A) 09/27/2018 0000   CREATININE 1.73 (H) 10/29/2023 0320   CALCIUM  8.5 (L) 10/29/2023 0320   PROT 6.3 (L) 10/01/2023 0435   ALBUMIN 2.2 (L) 10/01/2023 0435   AST 28 10/01/2023 0435   ALT 20 10/01/2023 0435   ALKPHOS 58 10/01/2023 0435   BILITOT 0.4 10/01/2023 0435   GFRNONAA 39 (L) 10/29/2023 0320   GFRAA 43 (L) 05/07/2020 0521   Lab Results  Component Value Date   CHOL 42 09/30/2023   HDL 14 (L) 09/30/2023   LDLCALC 16 09/30/2023   TRIG 62 09/30/2023   CHOLHDL 3.0 09/30/2023   Lab Results  Component Value Date   HGBA1C 6.1 (H) 09/30/2023   Lab Results  Component Value Date   VITAMINB12 405 09/08/2020   Lab Results  Component Value Date   TSH 6.285 (H) 09/29/2023   ASSESSMENT AND PLAN 83 y.o. year old male   1.  Multiple sclerosis vs cerebrovascular disease  2.  Gait disorder 3.  Neurogenic bladder, left nephrostomy tube, suprapubic catheter 4.  History of tremors 5.  Bilateral occipital white matter  and left cerebellar infarcts likely due to combination of small vessel disease and intracranial atherosclerosis in December 2024.  Complaints of left leg weakness not explained by infarcts 6.  Cervical stenosis C3-C4, C4-C5, C5-C6 7.  Lumbar stenosis, mild to moderate spinal canal narrowing L4-L5  -Needs to complete cardiac monitor to evaluate for A-fib, I have placed a new order - Continue aspirin  81 mg daily for secondary stroke prevention - Continue follow-up with neurosurgery, reportedly not a surgical candidate - Continue facility rehab, PT, OT - Strict management of vascular risk factors with a goal BP less than 130/90, A1c less than 7.0, LDL less than 70 for secondary stroke prevention - Follow up in 6 months with Dr. Avonne Lemons, AGNP-C, DNP 01/28/2024, 2:22 PM [facility] [address], [address] [place], [place] [address] [phone]

## 2024-01-28 NOTE — Progress Notes (Signed)
 I agree with the above plan

## 2024-01-29 DIAGNOSIS — F419 Anxiety disorder, unspecified: Secondary | ICD-10-CM | POA: Diagnosis not present

## 2024-01-29 DIAGNOSIS — F5102 Adjustment insomnia: Secondary | ICD-10-CM | POA: Diagnosis not present

## 2024-01-30 ENCOUNTER — Other Ambulatory Visit: Payer: Self-pay

## 2024-01-30 ENCOUNTER — Inpatient Hospital Stay (HOSPITAL_COMMUNITY)
Admission: EM | Admit: 2024-01-30 | Discharge: 2024-02-02 | DRG: 698 | Disposition: A | Discharge status: Skilled Nursing Facility | Source: Skilled Nursing Facility | Attending: Family Medicine | Admitting: Family Medicine

## 2024-01-30 ENCOUNTER — Emergency Department (HOSPITAL_COMMUNITY)

## 2024-01-30 DIAGNOSIS — R0902 Hypoxemia: Secondary | ICD-10-CM | POA: Diagnosis not present

## 2024-01-30 DIAGNOSIS — T83518A Infection and inflammatory reaction due to other urinary catheter, initial encounter: Principal | ICD-10-CM | POA: Diagnosis present

## 2024-01-30 DIAGNOSIS — J189 Pneumonia, unspecified organism: Secondary | ICD-10-CM | POA: Diagnosis present

## 2024-01-30 DIAGNOSIS — E785 Hyperlipidemia, unspecified: Secondary | ICD-10-CM | POA: Diagnosis present

## 2024-01-30 DIAGNOSIS — I679 Cerebrovascular disease, unspecified: Secondary | ICD-10-CM | POA: Diagnosis present

## 2024-01-30 DIAGNOSIS — K592 Neurogenic bowel, not elsewhere classified: Secondary | ICD-10-CM | POA: Diagnosis not present

## 2024-01-30 DIAGNOSIS — I639 Cerebral infarction, unspecified: Secondary | ICD-10-CM | POA: Diagnosis present

## 2024-01-30 DIAGNOSIS — Z860100 Personal history of colon polyps, unspecified: Secondary | ICD-10-CM

## 2024-01-30 DIAGNOSIS — N390 Urinary tract infection, site not specified: Secondary | ICD-10-CM | POA: Diagnosis present

## 2024-01-30 DIAGNOSIS — Z936 Other artificial openings of urinary tract status: Secondary | ICD-10-CM

## 2024-01-30 DIAGNOSIS — R627 Adult failure to thrive: Secondary | ICD-10-CM | POA: Diagnosis not present

## 2024-01-30 DIAGNOSIS — J9601 Acute respiratory failure with hypoxia: Secondary | ICD-10-CM | POA: Diagnosis present

## 2024-01-30 DIAGNOSIS — R1310 Dysphagia, unspecified: Secondary | ICD-10-CM | POA: Diagnosis present

## 2024-01-30 DIAGNOSIS — E876 Hypokalemia: Secondary | ICD-10-CM | POA: Diagnosis present

## 2024-01-30 DIAGNOSIS — Z820 Family history of epilepsy and other diseases of the nervous system: Secondary | ICD-10-CM

## 2024-01-30 DIAGNOSIS — R509 Fever, unspecified: Secondary | ICD-10-CM | POA: Diagnosis not present

## 2024-01-30 DIAGNOSIS — Z8249 Family history of ischemic heart disease and other diseases of the circulatory system: Secondary | ICD-10-CM

## 2024-01-30 DIAGNOSIS — M4628 Osteomyelitis of vertebra, sacral and sacrococcygeal region: Secondary | ICD-10-CM | POA: Diagnosis present

## 2024-01-30 DIAGNOSIS — I129 Hypertensive chronic kidney disease with stage 1 through stage 4 chronic kidney disease, or unspecified chronic kidney disease: Secondary | ICD-10-CM | POA: Diagnosis present

## 2024-01-30 DIAGNOSIS — Z87891 Personal history of nicotine dependence: Secondary | ICD-10-CM

## 2024-01-30 DIAGNOSIS — N261 Atrophy of kidney (terminal): Secondary | ICD-10-CM | POA: Diagnosis present

## 2024-01-30 DIAGNOSIS — Z681 Body mass index (BMI) 19 or less, adult: Secondary | ICD-10-CM | POA: Diagnosis not present

## 2024-01-30 DIAGNOSIS — E440 Moderate protein-calorie malnutrition: Secondary | ICD-10-CM | POA: Diagnosis not present

## 2024-01-30 DIAGNOSIS — N319 Neuromuscular dysfunction of bladder, unspecified: Secondary | ICD-10-CM | POA: Diagnosis present

## 2024-01-30 DIAGNOSIS — I739 Peripheral vascular disease, unspecified: Secondary | ICD-10-CM | POA: Diagnosis not present

## 2024-01-30 DIAGNOSIS — R52 Pain, unspecified: Secondary | ICD-10-CM | POA: Diagnosis present

## 2024-01-30 DIAGNOSIS — K219 Gastro-esophageal reflux disease without esophagitis: Secondary | ICD-10-CM | POA: Diagnosis present

## 2024-01-30 DIAGNOSIS — G4733 Obstructive sleep apnea (adult) (pediatric): Secondary | ICD-10-CM | POA: Diagnosis present

## 2024-01-30 DIAGNOSIS — J440 Chronic obstructive pulmonary disease with acute lower respiratory infection: Secondary | ICD-10-CM | POA: Diagnosis present

## 2024-01-30 DIAGNOSIS — J449 Chronic obstructive pulmonary disease, unspecified: Secondary | ICD-10-CM | POA: Diagnosis present

## 2024-01-30 DIAGNOSIS — R6520 Severe sepsis without septic shock: Principal | ICD-10-CM | POA: Diagnosis present

## 2024-01-30 DIAGNOSIS — R531 Weakness: Secondary | ICD-10-CM | POA: Diagnosis not present

## 2024-01-30 DIAGNOSIS — Z79899 Other long term (current) drug therapy: Secondary | ICD-10-CM

## 2024-01-30 DIAGNOSIS — R918 Other nonspecific abnormal finding of lung field: Secondary | ICD-10-CM | POA: Diagnosis not present

## 2024-01-30 DIAGNOSIS — G35 Multiple sclerosis: Secondary | ICD-10-CM | POA: Diagnosis not present

## 2024-01-30 DIAGNOSIS — I959 Hypotension, unspecified: Secondary | ICD-10-CM | POA: Diagnosis not present

## 2024-01-30 DIAGNOSIS — I2510 Atherosclerotic heart disease of native coronary artery without angina pectoris: Secondary | ICD-10-CM | POA: Diagnosis not present

## 2024-01-30 DIAGNOSIS — Z881 Allergy status to other antibiotic agents status: Secondary | ICD-10-CM

## 2024-01-30 DIAGNOSIS — N184 Chronic kidney disease, stage 4 (severe): Secondary | ICD-10-CM | POA: Diagnosis present

## 2024-01-30 DIAGNOSIS — F419 Anxiety disorder, unspecified: Secondary | ICD-10-CM | POA: Diagnosis present

## 2024-01-30 DIAGNOSIS — E039 Hypothyroidism, unspecified: Secondary | ICD-10-CM | POA: Diagnosis present

## 2024-01-30 DIAGNOSIS — L89154 Pressure ulcer of sacral region, stage 4: Secondary | ICD-10-CM | POA: Diagnosis present

## 2024-01-30 DIAGNOSIS — N312 Flaccid neuropathic bladder, not elsewhere classified: Secondary | ICD-10-CM | POA: Diagnosis present

## 2024-01-30 DIAGNOSIS — Z8673 Personal history of transient ischemic attack (TIA), and cerebral infarction without residual deficits: Secondary | ICD-10-CM

## 2024-01-30 DIAGNOSIS — R110 Nausea: Secondary | ICD-10-CM | POA: Diagnosis present

## 2024-01-30 DIAGNOSIS — Z515 Encounter for palliative care: Secondary | ICD-10-CM

## 2024-01-30 DIAGNOSIS — Z7982 Long term (current) use of aspirin: Secondary | ICD-10-CM

## 2024-01-30 DIAGNOSIS — A4151 Sepsis due to Escherichia coli [E. coli]: Secondary | ICD-10-CM | POA: Diagnosis not present

## 2024-01-30 DIAGNOSIS — I10 Essential (primary) hypertension: Secondary | ICD-10-CM | POA: Diagnosis present

## 2024-01-30 DIAGNOSIS — Y95 Nosocomial condition: Secondary | ICD-10-CM | POA: Diagnosis present

## 2024-01-30 DIAGNOSIS — Y846 Urinary catheterization as the cause of abnormal reaction of the patient, or of later complication, without mention of misadventure at the time of the procedure: Secondary | ICD-10-CM | POA: Diagnosis present

## 2024-01-30 DIAGNOSIS — Z87442 Personal history of urinary calculi: Secondary | ICD-10-CM

## 2024-01-30 DIAGNOSIS — R1312 Dysphagia, oropharyngeal phase: Secondary | ICD-10-CM | POA: Diagnosis present

## 2024-01-30 DIAGNOSIS — N1832 Chronic kidney disease, stage 3b: Secondary | ICD-10-CM | POA: Diagnosis not present

## 2024-01-30 DIAGNOSIS — Z9049 Acquired absence of other specified parts of digestive tract: Secondary | ICD-10-CM

## 2024-01-30 DIAGNOSIS — J8410 Pulmonary fibrosis, unspecified: Secondary | ICD-10-CM | POA: Diagnosis present

## 2024-01-30 DIAGNOSIS — R278 Other lack of coordination: Secondary | ICD-10-CM | POA: Diagnosis present

## 2024-01-30 DIAGNOSIS — L89620 Pressure ulcer of left heel, unstageable: Secondary | ICD-10-CM | POA: Diagnosis present

## 2024-01-30 DIAGNOSIS — Z87892 Personal history of anaphylaxis: Secondary | ICD-10-CM

## 2024-01-30 DIAGNOSIS — M6281 Muscle weakness (generalized): Secondary | ICD-10-CM | POA: Diagnosis present

## 2024-01-30 DIAGNOSIS — K439 Ventral hernia without obstruction or gangrene: Secondary | ICD-10-CM | POA: Diagnosis present

## 2024-01-30 DIAGNOSIS — Z7189 Other specified counseling: Secondary | ICD-10-CM | POA: Diagnosis not present

## 2024-01-30 DIAGNOSIS — I700 Atherosclerosis of aorta: Secondary | ICD-10-CM | POA: Diagnosis not present

## 2024-01-30 DIAGNOSIS — Z8701 Personal history of pneumonia (recurrent): Secondary | ICD-10-CM

## 2024-01-30 DIAGNOSIS — N179 Acute kidney failure, unspecified: Secondary | ICD-10-CM | POA: Diagnosis not present

## 2024-01-30 DIAGNOSIS — Z823 Family history of stroke: Secondary | ICD-10-CM

## 2024-01-30 DIAGNOSIS — Z79890 Hormone replacement therapy (postmenopausal): Secondary | ICD-10-CM

## 2024-01-30 DIAGNOSIS — R000 Tachycardia, unspecified: Secondary | ICD-10-CM | POA: Diagnosis not present

## 2024-01-30 DIAGNOSIS — Z7401 Bed confinement status: Secondary | ICD-10-CM | POA: Diagnosis not present

## 2024-01-30 DIAGNOSIS — K5900 Constipation, unspecified: Secondary | ICD-10-CM | POA: Diagnosis present

## 2024-01-30 DIAGNOSIS — R54 Age-related physical debility: Secondary | ICD-10-CM | POA: Diagnosis present

## 2024-01-30 DIAGNOSIS — A419 Sepsis, unspecified organism: Secondary | ICD-10-CM | POA: Diagnosis not present

## 2024-01-30 DIAGNOSIS — Z9359 Other cystostomy status: Secondary | ICD-10-CM | POA: Diagnosis not present

## 2024-01-30 DIAGNOSIS — G35D Multiple sclerosis, unspecified: Secondary | ICD-10-CM | POA: Diagnosis present

## 2024-01-30 LAB — CBC WITH DIFFERENTIAL/PLATELET
Abs Immature Granulocytes: 0.16 10*3/uL — ABNORMAL HIGH (ref 0.00–0.07)
Basophils Absolute: 0 10*3/uL (ref 0.0–0.1)
Basophils Relative: 0 %
Eosinophils Absolute: 0 10*3/uL (ref 0.0–0.5)
Eosinophils Relative: 0 %
HCT: 31.6 % — ABNORMAL LOW (ref 39.0–52.0)
Hemoglobin: 10.2 g/dL — ABNORMAL LOW (ref 13.0–17.0)
Immature Granulocytes: 1 %
Lymphocytes Relative: 3 %
Lymphs Abs: 0.6 10*3/uL — ABNORMAL LOW (ref 0.7–4.0)
MCH: 29.2 pg (ref 26.0–34.0)
MCHC: 32.3 g/dL (ref 30.0–36.0)
MCV: 90.5 fL (ref 80.0–100.0)
Monocytes Absolute: 0.7 10*3/uL (ref 0.1–1.0)
Monocytes Relative: 4 %
Neutro Abs: 17.5 10*3/uL — ABNORMAL HIGH (ref 1.7–7.7)
Neutrophils Relative %: 92 %
Platelets: 276 10*3/uL (ref 150–400)
RBC: 3.49 MIL/uL — ABNORMAL LOW (ref 4.22–5.81)
RDW: 15.7 % — ABNORMAL HIGH (ref 11.5–15.5)
WBC: 19.1 10*3/uL — ABNORMAL HIGH (ref 4.0–10.5)
nRBC: 0 % (ref 0.0–0.2)

## 2024-01-30 LAB — URINALYSIS, W/ REFLEX TO CULTURE (INFECTION SUSPECTED)
Bilirubin Urine: NEGATIVE
Glucose, UA: NEGATIVE mg/dL
Ketones, ur: NEGATIVE mg/dL
Nitrite: POSITIVE — AB
Protein, ur: 100 mg/dL — AB
Specific Gravity, Urine: 1.009 (ref 1.005–1.030)
WBC, UA: >50 WBC/hpf (ref 0–5)
pH: 6 (ref 5.0–8.0)

## 2024-01-30 LAB — PROTIME-INR
INR: 1.1 (ref 0.8–1.2)
Prothrombin Time: 14.1 s (ref 11.4–15.2)

## 2024-01-30 LAB — COMPREHENSIVE METABOLIC PANEL WITH GFR
ALT: 14 U/L (ref 0–44)
AST: 24 U/L (ref 15–41)
Albumin: 2.5 g/dL — ABNORMAL LOW (ref 3.5–5.0)
Alkaline Phosphatase: 99 U/L (ref 38–126)
Anion gap: 13 (ref 5–15)
BUN: 69 mg/dL — ABNORMAL HIGH (ref 8–23)
CO2: 18 mmol/L — ABNORMAL LOW (ref 22–32)
Calcium: 8.8 mg/dL — ABNORMAL LOW (ref 8.9–10.3)
Chloride: 109 mmol/L (ref 98–111)
Creatinine, Ser: 3.46 mg/dL — ABNORMAL HIGH (ref 0.61–1.24)
GFR, Estimated: 17 mL/min — ABNORMAL LOW (ref >60)
Glucose, Bld: 148 mg/dL — ABNORMAL HIGH (ref 70–99)
Potassium: 4.3 mmol/L (ref 3.5–5.1)
Sodium: 140 mmol/L (ref 135–145)
Total Bilirubin: 0.5 mg/dL (ref 0.0–1.2)
Total Protein: 7.4 g/dL (ref 6.5–8.1)

## 2024-01-30 LAB — MRSA NEXT GEN BY PCR, NASAL: MRSA by PCR Next Gen: DETECTED — AB

## 2024-01-30 LAB — LACTIC ACID, PLASMA
Lactic Acid, Venous: 2.6 mmol/L (ref 0.5–1.9)
Lactic Acid, Venous: 1.4 mmol/L (ref 0.5–1.9)

## 2024-01-30 MED ORDER — OXYBUTYNIN CHLORIDE 5 MG PO TABS
5.0000 mg | ORAL_TABLET | Freq: Three times a day (TID) | ORAL | Status: DC
  Administered 2024-01-30 – 2024-02-02 (×8): 5 mg via ORAL
  Filled 2024-01-30 (×9): qty 1

## 2024-01-30 MED ORDER — SODIUM CHLORIDE 0.9 % IV SOLN
2.0000 g | Freq: Once | INTRAVENOUS | Status: AC
  Administered 2024-01-30: 2 g via INTRAVENOUS
  Filled 2024-01-30: qty 12.5

## 2024-01-30 MED ORDER — HEPARIN SODIUM (PORCINE) 5000 UNIT/ML IJ SOLN
5000.0000 [IU] | Freq: Three times a day (TID) | INTRAMUSCULAR | Status: DC
  Administered 2024-01-30 – 2024-02-02 (×10): 5000 [IU] via SUBCUTANEOUS
  Filled 2024-01-30 (×11): qty 1

## 2024-01-30 MED ORDER — ACETAMINOPHEN 500 MG PO TABS
1000.0000 mg | ORAL_TABLET | Freq: Once | ORAL | Status: AC
  Administered 2024-01-30: 1000 mg via ORAL
  Filled 2024-01-30: qty 2

## 2024-01-30 MED ORDER — POLYETHYLENE GLYCOL 3350 17 G PO PACK: 17.0000 g | PACK | Freq: Every day | ORAL | Status: DC | PRN

## 2024-01-30 MED ORDER — IPRATROPIUM-ALBUTEROL 0.5-2.5 (3) MG/3ML IN SOLN
3.0000 mL | Freq: Four times a day (QID) | RESPIRATORY_TRACT | Status: DC
  Administered 2024-01-30 – 2024-01-31 (×5): 3 mL via RESPIRATORY_TRACT
  Filled 2024-01-30 (×5): qty 3

## 2024-01-30 MED ORDER — VANCOMYCIN HCL IN DEXTROSE 1-5 GM/200ML-% IV SOLN
1000.0000 mg | Freq: Once | INTRAVENOUS | Status: AC
  Administered 2024-01-30: 1000 mg via INTRAVENOUS
  Filled 2024-01-30: qty 200

## 2024-01-30 MED ORDER — MORPHINE SULFATE (PF) 2 MG/ML IV SOLN
2.0000 mg | Freq: Once | INTRAVENOUS | Status: AC
  Administered 2024-01-30: 2 mg via INTRAVENOUS
  Filled 2024-01-30: qty 1

## 2024-01-30 MED ORDER — PANTOPRAZOLE SODIUM 40 MG PO TBEC
40.0000 mg | DELAYED_RELEASE_TABLET | Freq: Every day | ORAL | Status: DC
  Administered 2024-01-31 – 2024-02-02 (×3): 40 mg via ORAL
  Filled 2024-01-30 (×3): qty 1

## 2024-01-30 MED ORDER — MIRABEGRON ER 25 MG PO TB24
50.0000 mg | ORAL_TABLET | Freq: Every day | ORAL | Status: DC
  Administered 2024-01-30 – 2024-02-02 (×4): 50 mg via ORAL
  Filled 2024-01-30 (×4): qty 2

## 2024-01-30 MED ORDER — ALBUTEROL SULFATE (2.5 MG/3ML) 0.083% IN NEBU: 2.5000 mg | INHALATION_SOLUTION | RESPIRATORY_TRACT | Status: DC | PRN

## 2024-01-30 MED ORDER — ONDANSETRON HCL 4 MG PO TABS: 4.0000 mg | ORAL_TABLET | Freq: Four times a day (QID) | ORAL | Status: DC | PRN

## 2024-01-30 MED ORDER — SODIUM CHLORIDE 0.9 % IV BOLUS
1000.0000 mL | Freq: Once | INTRAVENOUS | Status: AC
  Administered 2024-01-30: 1000 mL via INTRAVENOUS

## 2024-01-30 MED ORDER — METHYLPREDNISOLONE SODIUM SUCC 40 MG IJ SOLR
40.0000 mg | Freq: Two times a day (BID) | INTRAMUSCULAR | Status: DC
  Administered 2024-01-30 – 2024-02-02 (×6): 40 mg via INTRAVENOUS
  Filled 2024-01-30 (×6): qty 1

## 2024-01-30 MED ORDER — ACETAMINOPHEN 325 MG PO TABS
650.0000 mg | ORAL_TABLET | Freq: Four times a day (QID) | ORAL | Status: DC | PRN
  Administered 2024-01-31: 650 mg via ORAL
  Filled 2024-01-30: qty 2

## 2024-01-30 MED ORDER — BISACODYL 10 MG RE SUPP: 10.0000 mg | Freq: Every day | RECTAL | Status: DC | PRN

## 2024-01-30 MED ORDER — METHOCARBAMOL 500 MG PO TABS
500.0000 mg | ORAL_TABLET | Freq: Four times a day (QID) | ORAL | Status: DC
  Administered 2024-01-30 – 2024-02-02 (×8): 500 mg via ORAL
  Filled 2024-01-30 (×10): qty 1

## 2024-01-30 MED ORDER — MELATONIN 3 MG PO TABS: 3.0000 mg | ORAL_TABLET | Freq: Every day | ORAL | Status: DC

## 2024-01-30 MED ORDER — ASPIRIN 81 MG PO CHEW
81.0000 mg | CHEWABLE_TABLET | Freq: Every day | ORAL | Status: DC
  Administered 2024-01-31 – 2024-02-02 (×3): 81 mg via ORAL
  Filled 2024-01-30 (×3): qty 1

## 2024-01-30 MED ORDER — TRAZODONE HCL 50 MG PO TABS: 50.0000 mg | ORAL_TABLET | Freq: Every evening | ORAL | Status: DC | PRN

## 2024-01-30 MED ORDER — AMLODIPINE BESYLATE 5 MG PO TABS
5.0000 mg | ORAL_TABLET | Freq: Every day | ORAL | Status: DC
  Administered 2024-01-31 – 2024-02-02 (×3): 5 mg via ORAL
  Filled 2024-01-30 (×3): qty 1

## 2024-01-30 MED ORDER — SODIUM CHLORIDE 0.9 % IV SOLN
1.0000 g | Freq: Once | INTRAVENOUS | Status: AC
  Administered 2024-01-30: 1 g via INTRAVENOUS
  Filled 2024-01-30: qty 20

## 2024-01-30 MED ORDER — SODIUM CHLORIDE 0.9% FLUSH
3.0000 mL | Freq: Two times a day (BID) | INTRAVENOUS | Status: DC
  Administered 2024-01-30 – 2024-02-02 (×6): 3 mL via INTRAVENOUS

## 2024-01-30 MED ORDER — DIAZEPAM 5 MG PO TABS
5.0000 mg | ORAL_TABLET | Freq: Two times a day (BID) | ORAL | Status: DC
  Administered 2024-01-30 – 2024-02-02 (×5): 5 mg via ORAL
  Filled 2024-01-30 (×6): qty 1

## 2024-01-30 MED ORDER — SODIUM CHLORIDE 0.9 % IV SOLN: INTRAVENOUS | Status: AC | PRN

## 2024-01-30 MED ORDER — DRONABINOL 2.5 MG PO CAPS
2.5000 mg | ORAL_CAPSULE | Freq: Two times a day (BID) | ORAL | Status: DC
  Administered 2024-01-30 – 2024-02-02 (×5): 2.5 mg via ORAL
  Filled 2024-01-30 (×7): qty 1

## 2024-01-30 MED ORDER — SODIUM CHLORIDE 0.9% FLUSH
3.0000 mL | Freq: Two times a day (BID) | INTRAVENOUS | Status: DC
  Administered 2024-01-31 – 2024-02-02 (×4): 3 mL via INTRAVENOUS

## 2024-01-30 MED ORDER — ACETAMINOPHEN 650 MG RE SUPP: 650.0000 mg | Freq: Four times a day (QID) | RECTAL | Status: DC | PRN

## 2024-01-30 MED ORDER — SODIUM CHLORIDE 0.9 % IV SOLN
500.0000 mg | Freq: Two times a day (BID) | INTRAVENOUS | Status: DC
  Administered 2024-01-31 (×2): 500 mg via INTRAVENOUS
  Filled 2024-01-30 (×4): qty 10

## 2024-01-30 MED ORDER — SENNOSIDES 8.6 MG PO TABS: 1.0000 | ORAL_TABLET | Freq: Every day | ORAL | Status: DC

## 2024-01-30 MED ORDER — DM-GUAIFENESIN ER 30-600 MG PO TB12
1.0000 | ORAL_TABLET | Freq: Two times a day (BID) | ORAL | Status: DC
  Administered 2024-01-30 – 2024-02-02 (×6): 1 via ORAL
  Filled 2024-01-30 (×7): qty 1

## 2024-01-30 MED ORDER — HYDROCODONE-ACETAMINOPHEN 7.5-325 MG PO TABS
1.0000 | ORAL_TABLET | Freq: Two times a day (BID) | ORAL | Status: DC
  Administered 2024-01-30 – 2024-02-02 (×6): 1 via ORAL
  Filled 2024-01-30 (×6): qty 1

## 2024-01-30 MED ORDER — VANCOMYCIN VARIABLE DOSE PER UNSTABLE RENAL FUNCTION (PHARMACIST DOSING): Status: DC

## 2024-01-30 MED ORDER — ONDANSETRON HCL 4 MG/2ML IJ SOLN
4.0000 mg | Freq: Four times a day (QID) | INTRAMUSCULAR | Status: DC | PRN
  Administered 2024-01-31 – 2024-02-02 (×2): 4 mg via INTRAVENOUS
  Filled 2024-01-30 (×2): qty 2

## 2024-01-30 MED ORDER — SODIUM CHLORIDE 0.9 % IV SOLN: INTRAVENOUS | Status: DC

## 2024-01-30 MED ORDER — SENNOSIDES-DOCUSATE SODIUM 8.6-50 MG PO TABS
2.0000 | ORAL_TABLET | Freq: Every day | ORAL | Status: DC
  Administered 2024-01-30 – 2024-02-01 (×3): 2 via ORAL
  Filled 2024-01-30 (×4): qty 2

## 2024-01-30 MED ORDER — SODIUM CHLORIDE 0.9% FLUSH: 3.0000 mL | INTRAVENOUS | Status: DC | PRN

## 2024-01-30 MED ORDER — LEVOTHYROXINE SODIUM 50 MCG PO TABS
75.0000 ug | ORAL_TABLET | Freq: Every morning | ORAL | Status: DC
  Administered 2024-01-31 – 2024-02-02 (×3): 75 ug via ORAL
  Filled 2024-01-30 (×3): qty 2

## 2024-01-30 MED ORDER — ENSURE ENLIVE PO LIQD
237.0000 mL | Freq: Two times a day (BID) | ORAL | Status: DC
  Administered 2024-01-31 – 2024-02-02 (×4): 237 mL via ORAL

## 2024-01-30 MED ORDER — MIRTAZAPINE 15 MG PO TABS
7.5000 mg | ORAL_TABLET | Freq: Every day | ORAL | Status: DC
Start: 2024-01-30 — End: 2024-02-02
  Administered 2024-01-30 – 2024-02-01 (×3): 7.5 mg via ORAL
  Filled 2024-01-30 (×4): qty 1

## 2024-01-30 NOTE — ED Provider Notes (Signed)
 [place] EMERGENCY DEPARTMENT AT [facility] Provider Note   CSN: [id number] Arrival date & time: 01/30/24  [id number]     History  Chief Complaint  Patient presents with   Shortness of Breath    Jordan Ward is a 83 y.o. male.  HPI Elderly M from facility with concern for weakness and fatigue, hypoxia. Patient had multiple medical issues including COPD, fibrosis, catheter chronically.  Skin care nursing staff.  Patient noted to be weak than usual today, check to be hypoxic as well, 80% on room air.  Patient does not    Home Medications Prior to Admission medications   Medication Sig Start Date End Date Taking? Authorizing Provider  acetaminophen  (TYLENOL ) 325 MG tablet Take 650 mg by mouth every 6 (six) hours as needed for fever, mild pain (pain score 1-3) or moderate pain (pain score 4-6). Patient not taking: Reported on 01/28/2024    [provider]  amLODipine  (NORVASC ) 10 MG tablet Take 5 mg by mouth daily. 05/21/21   [provider]  ascorbic acid  (VITAMIN C) 500 MG tablet Take 500 mg by mouth daily.    [provider]  aspirin  81 MG chewable tablet Chew 81 mg by mouth daily.    [provider]  diazepam  (VALIUM ) 5 MG tablet Take 1 tablet (5 mg total) by mouth 2 (two) times daily. 10/29/23   Johnson, Clanford L, MD  dronabinol (MARINOL) 2.5 MG capsule Take 2.5 mg by mouth 2 (two) times daily before a meal.    [provider]  HYDROcodone -acetaminophen  (NORCO) 7.5-325 MG tablet Take 1 tablet by mouth 2 (two) times daily. Max APAP 3 GM IN 24 HOURS FROM ALL SOURCES 10/29/23   Johnson, Clanford L, MD  levothyroxine  (SYNTHROID ) 75 MCG tablet Take 75 mcg by mouth in the morning. 07/02/21   [provider]  melatonin 3 MG TABS tablet Take 3 mg by mouth at bedtime.    [provider]  methocarbamol (ROBAXIN) 500 MG tablet Take 500 mg by mouth 4 (four) times daily.    [provider]  mirabegron  ER (MYRBETRIQ ) 50  MG TB24 tablet Take 1 tablet (50 mg total) by mouth daily. 10/12/23   Mason Sole, Pratik D, DO  Multiple Vitamin (MULTIVITAMIN) tablet Take 1 tablet by mouth daily.    [provider]  oxybutynin  (DITROPAN ) 5 MG tablet Take 5 mg by mouth 3 (three) times daily.    [provider]  OXYGEN  Inhale 1 L into the lungs every evening.    [provider]  pantoprazole  (PROTONIX ) 40 MG tablet Take 1 tablet (40 mg total) by mouth daily. 10/12/23   Mason Sole, Pratik D, DO  Potassium Bicarb-Citric Acid 20 MEQ TBEF Take by mouth.    [provider]  potassium chloride  SA (KLOR-CON ) 20 MEQ tablet Take 20 mEq by mouth daily. 07/02/21   [provider]  senna (SENOKOT) 8.6 MG tablet Take 1 tablet by mouth daily.    [provider]  thiamine  (VITAMIN B-1) 100 MG tablet Take 1 tablet (100 mg total) by mouth daily. 10/12/23   Mason Sole, Pratik D, DO  thiamine  (VITAMIN B1) 100 MG tablet Take 100 mg by mouth daily.    [provider]  zinc  gluconate 50 MG tablet Take 50 mg by mouth daily.    [provider]  zinc  sulfate, 50mg  elemental zinc , 220 (50 Zn) MG capsule Take 220 mg by mouth daily.    [provider]  Allergies    Tetracyclines & related, Cipro  [ciprofloxacin  hcl], and Ciprofloxacin     Review of Systems   Review of Systems  Physical Exam Updated Vital Signs BP (!) 141/71   Pulse 88   Temp (!) 101.8 F (38.8 C) (Rectal)   Resp (!) 21   Ht 5\' 5"  (1.651 m)   Wt 48.8 kg   SpO2 100%   BMI 17.91 kg/m  Physical Exam Vitals and nursing note reviewed.  Constitutional:      Appearance: He is ill-appearing.  HENT:     Head: Normocephalic and atraumatic.  Eyes:     Conjunctiva/sclera: Conjunctivae normal.  Cardiovascular:     Rate and Rhythm: Regular rhythm. Tachycardia present.  Pulmonary:     Effort: Tachypnea, accessory muscle usage and respiratory distress present.     Breath sounds: Decreased breath sounds present.   Abdominal:     General: There is no distension.     Comments: Suprapubic catheter  Musculoskeletal:     Comments: No abnormal, patient's extremities spontaneously.  Skin:    General: Skin is warm and dry.  Neurological:     Mental Status: He is alert.     Comments: Substantial atrophy, patient with residual extremities, but responding to commands inconsistently.  Psychiatric:     Comments: Repetitive     ED Results / Procedures / Treatments   Labs (all labs ordered are listed, but only abnormal results are displayed) Labs Reviewed  LACTIC ACID, PLASMA - Abnormal; Notable for the following components:      Result Value   Lactic Acid, Venous 2.6 (*)    All other components within normal limits  COMPREHENSIVE METABOLIC PANEL WITH GFR - Abnormal; Notable for the following components:   CO2 18 (*)    Glucose, Bld 148 (*)    BUN 69 (*)    Creatinine, Ser 3.46 (*)    Calcium  8.8 (*)    Albumin 2.5 (*)    GFR, Estimated 17 (*)    All other components within normal limits  CBC WITH DIFFERENTIAL/PLATELET - Abnormal; Notable for the following components:   WBC 19.1 (*)    RBC 3.49 (*)    Hemoglobin 10.2 (*)    HCT 31.6 (*)    RDW 15.7 (*)    Neutro Abs 17.5 (*)    Lymphs Abs 0.6 (*)    Abs Immature Granulocytes 0.16 (*)    All other components within normal limits  URINALYSIS, W/ REFLEX TO CULTURE (INFECTION SUSPECTED) - Abnormal; Notable for the following components:   APPearance HAZY (*)    Hgb urine dipstick SMALL (*)    Protein, ur 100 (*)    Nitrite POSITIVE (*)    Leukocytes,Ua LARGE (*)    Bacteria, UA RARE (*)    All other components within normal limits  CULTURE, BLOOD (ROUTINE X 2)  CULTURE, BLOOD (ROUTINE X 2)  URINE CULTURE  PROTIME-INR  LACTIC ACID, PLASMA    EKG EKG Interpretation Date/Time:  Saturday Jan 30 2024 09:24:15 EDT Ventricular Rate:  103 PR Interval:  51 QRS Duration:  116 QT Interval:  397 QTC Calculation: 520 R Axis:   61  Text  Interpretation: Sinus tachycardia Probable LVH with secondary repol abnrm Baseline wander Artifact Abnormal ECG Confirmed by Dorenda Gandy [id number]) on 01/30/2024 9:53:39 AM  Radiology DG Chest Port 1 View Result Date: 01/30/2024 CLINICAL DATA:  Questionable sepsis - evaluate for abnormality EXAM: PORTABLE CHEST - 1 VIEW COMPARISON:  11/03/2023 FINDINGS: Coarse chronic  appearing interstitial opacities right greater than left, with some improvement in the airspace component particularly the right lung base. No new infiltrate or overt edema. Heart size and mediastinal contours are within normal limits. Aortic Atherosclerosis (ICD10-170.0). No effusion. Visualized bones unremarkable. IMPRESSION: Chronic interstitial opacities with some improvement in the right basilar airspace component. Electronically Signed   By: Nicoletta Barrier M.D.   On: 01/30/2024 10:23    Procedures Procedures    Medications Ordered in ED Medications  vancomycin  (VANCOCIN ) IVPB 1000 mg/200 mL premix (1,000 mg Intravenous New Bag/Given 01/30/24 1120)  acetaminophen  (TYLENOL ) tablet 1,000 mg (has no administration in time range)  morphine  (PF) 2 MG/ML injection 2 mg (has no administration in time range)  sodium chloride  0.9 % bolus 1,000 mL (1,000 mLs Intravenous New Bag/Given 01/30/24 1036)  ceFEPIme  (MAXIPIME ) 2 g in sodium chloride  0.9 % 100 mL IVPB (2 g Intravenous New Bag/Given 01/30/24 1119)    ED Course/ Medical Decision Making/ A&P                                 Medical Decision Making Patient with substantial history of pulmonary disease presents with dyspnea, fatigue.  Per EMS patient has new hypoxia, was febrile in the field.  Patient not a substantial contributory to his own history. On exam patient has increased work of breathing, decreased breath sounds, tachypnea, tachycardic, concern for pneumonia versus bacteremia, sepsis, COPD exacerbation. Patient's 2 external urine collection devices both in them, little evidence  for obstruction, infection a consideration here as well. Pulse ox 93% 6 L nasal cannula abnormal   Amount and/or Complexity of Data Reviewed Independent Historian: EMS External Data Reviewed: notes. Labs: ordered. Decision-making details documented in ED Course. Radiology: ordered and independent interpretation performed. Decision-making details documented in ED Course. ECG/medicine tests: ordered and independent interpretation performed. Decision-making details documented in ED Course.  Risk OTC drugs. Prescription drug management. Decision regarding hospitalization. Diagnosis or treatment significantly limited by social determinants of health.   11:59 AM Patient accompanied by his wife.  We discussed the presentation, concern for pneumonia versus sepsis, acute kidney injury.  Urinalysis not available yet due to the patient's severe dehydration, but he is receiving fluid resuscitation.  He does not have hypotension which is somewhat reassuring.  Patient is in pain, will receive morphine , has received Tylenol , fluids, broad-spectrum antibiotics, will write admission for sepsis likely pneumonia, though with consideration of catheter associated urinary tract infection as well.  Final Clinical Impression(s) / ED Diagnoses Final diagnoses:  Severe sepsis (HCC)  AKI (acute kidney injury) (HCC)  HCAP (healthcare-associated pneumonia)   CRITICAL CARE Performed by: Dorenda Gandy Total critical care time: 35 minutes Critical care time was exclusive of separately billable procedures and treating other patients. Critical care was necessary to treat or prevent imminent or life-threatening deterioration. Critical care was time spent personally by me on the following activities: development of treatment plan with patient and/or surrogate as well as nursing, discussions with consultants, evaluation of patient's response to treatment, examination of patient, obtaining history from patient or  surrogate, ordering and performing treatments and interventions, ordering and review of laboratory studies, ordering and review of radiographic studies, pulse oximetry and re-evaluation of patient's condition.    Dorenda Gandy, MD 01/30/24 1200

## 2024-01-30 NOTE — ED Notes (Signed)
 Vanita Gens, RN from [facility] called for an update on patient, and she was updated.

## 2024-01-30 NOTE — ED Notes (Signed)
 Suprapubic Catheter clamped off to get a UA sample.

## 2024-01-30 NOTE — ED Notes (Signed)
 Patient repositioned to relieve pressure off of back.

## 2024-01-30 NOTE — ED Notes (Signed)
 Pt. brief and chux pad changed. Had a little urine on both. Pt reports with spasms, he can have urine leak around his super pubic catheter. Pt reports feels a little better after the MS04.

## 2024-01-30 NOTE — ED Notes (Signed)
 Called to update the wife with the room number.

## 2024-01-30 NOTE — Progress Notes (Signed)
   01/30/24 2218  Provider Notification  Provider Name/Title Dr. Elyse Hand  Date Provider Notified 01/30/24  Time Provider Notified 2218  Notification Reason Critical Result  Test performed and critical result blood cultures positive  for gram - rods  Date Critical Result Received 01/30/24  Time Critical Result Received 2215

## 2024-01-30 NOTE — ED Triage Notes (Signed)
 Pt arrived [facility] for SOB. Pt at [facility] on room air was in 70's. Pt placed on nasal cannula . Pt alert and verbal. Pt is on 7 lpm.

## 2024-01-30 NOTE — ED Notes (Signed)
 D/C foley, MD was unaware pt had a Suprapubic Foley.

## 2024-01-30 NOTE — TOC Initial Note (Signed)
 Transition of Care [facility]) - Initial/Assessment Note    Patient Details  Name: Jordan Ward MRN: [id number] Date of Birth: 1941-02-16  Transition of Care [facility]) CM/SW Contact:    Lynda Sands, RN Phone Number: 01/30/2024, 6:27 PM  Clinical Narrative:   CM met with patient and spouse at bedside. Admitted with       AKI (acute kidney injury) (HCC). Patient spouse reports patient  is at [facility] for rehabilitation. Patient needs assistance. Patient's spouse reports 4 months ago patient was able to walk with walker and fix his lunch. Patient's spouse reports he will be at [facility] until the end of May,   Expected Discharge Plan: Skilled Nursing Facility Barriers to Discharge: Continued Medical Work up   Patient Goals and CMS Choice       [place] ownership interest in [facility].provided to:: Patient    Expected Discharge Plan and Services     Post Acute Care Choice: Skilled Nursing Facility Living arrangements for the past 2 months: Skilled Nursing Facility                    Prior Living Arrangements/Services Living arrangements for the past 2 months: Skilled Nursing Facility Lives with:: Facility Resident          Need for Family Participation in Patient Care: Yes (Comment) Care giver support system in place?: Yes (comment)      Activities of Daily Living   ADL Screening (condition at time of admission) Independently performs ADLs?: No Does the patient have a NEW difficulty with bathing/dressing/toileting/self-feeding that is expected to last >3 days?: Yes (Initiates electronic notice to provider for possible OT consult) Does the patient have a NEW difficulty with getting in/out of bed, walking, or climbing stairs that is expected to last >3 days?: Yes (Initiates electronic notice to provider for possible PT consult) Does the patient have a NEW difficulty with communication that is expected to last >3 days?: No Is the patient deaf or have  difficulty hearing?: No Does the patient have difficulty seeing, even when wearing glasses/contacts?: No Does the patient have difficulty concentrating, remembering, or making decisions?: No  Permission Sought/Granted   Permission granted to share information with : Yes, Verbal Permission Granted          Emotional Assessment Appearance:: Appears stated age Attitude/Demeanor/Rapport: Apprehensive Affect (typically observed): Anxious Orientation: : Oriented to Self, Oriented to Place, Oriented to  Time, Oriented to Situation      Admission diagnosis:  AKI (acute kidney injury) (HCC) [N17.9] HCAP (healthcare-associated pneumonia) [J18.9] Severe sepsis (HCC) [A41.9, R65.20] Patient Active Problem List   Diagnosis Date Noted   AKI (acute kidney injury) (HCC) 01/30/2024   Dyslipidemia 10/29/2023   Anxiety 10/29/2023   Flank pain 10/29/2023   Displacement of nephrostomy tube (HCC) 10/28/2023   COVID-19 09/30/2023   CKD (chronic kidney disease) stage 4, GFR 15-29 ml/min (HCC) 09/29/2023   Nephrostomy present (HCC) 09/29/2023   Emphysema lung (HCC) 09/29/2023   Right renal atrophy 07/30/2023   Hypokalemia 05/19/2022   Pressure ulcer, heel 07/14/2021   Rigors 03/07/2021   Encounter for removal of vascular catheter 09/14/2020   Pneumonia due to COVID-19 virus 07/12/2020   Bacteremia due to Gram-negative bacteria 05/04/2020   Hydronephrosis 05/02/2020   Dehydration 03/02/2020   Nausea and vomiting 03/02/2020   Ventral hernia without obstruction or gangrene 03/01/2020   Hypotension 03/01/2020   Poor appetite 03/01/2020   Detrusor areflexia 09/16/2019   Cerebrovascular disease  08/17/2018   Partial small bowel obstruction (HCC) 07/11/2017   Choledocholithiasis 07/11/2017   Paroxysmal atrial tachycardia (HCC) 06/14/2017   Hypophosphatemia 06/14/2017   Pressure injury of skin 05/26/2017   Hydronephrosis due to obstruction of ureter 05/26/2017   Obstructive uropathy 05/25/2017    CKD (chronic kidney disease), stage III (HCC) 08/23/2016   Hydronephrosis of left kidney 08/23/2016   Kidney stones 09/13/2015   Essential hypertension    Pressure ulcer 08/04/2015   Chronic diastolic (congestive) heart failure (HCC) 08/04/2015   Hydronephrosis with obstructing calculus 08/04/2015   Occult blood positive stool 09/05/2014   Edema 08/19/2014   Dilated cardiomyopathy (HCC) 07/21/2014   Headache 03/13/2014   Cerebral infarction (HCC) 03/13/2014   Abnormality of gait 03/13/2014   Tremors of nervous system 10/08/2011   Hypothyroidism 10/08/2011   Pulmonary nodule 10/08/2011   Dysphagia 10/07/2011   HTN (hypertension), malignant 10/06/2011   Chronic suprapubic catheter (HCC) 10/06/2011   Pulmonary fibrosis (HCC) 10/06/2011   UTI (urinary tract infection) 10/03/2011   Junctional rhythm 10/02/2011   Multiple sclerosis (HCC) 10/02/2011   Chronic diarrhea 06/17/2011   Hx of adenomatous colonic polyps 06/17/2011   High grade dysplasia in colonic adenoma 09/29/2005   PCP:  Omie Bickers, MD Pharmacy:   [facility] - [place], [place] - [address] [place] [address] Phone: [phone] Fax: [phone]  [facility] - [place], [place] - [address] [place] [address] Phone: [phone] Fax: [phone]     Social Drivers of Health (SDOH) Social History: SDOH Screenings   Food Insecurity: No Food Insecurity (01/30/2024)  Recent Concern: Food Insecurity - Food Insecurity Present (11/03/2023)   Received from [facility]  Housing: Low Risk  (01/30/2024)  Transportation Needs: No Transportation Needs (01/30/2024)  Utilities: Not At Risk (01/30/2024)  Depression (PHQ2-9): Low Risk  (09/26/2022)  Financial Resource Strain: Low Risk  (11/03/2023)   Received from [facility]  Physical Activity: Inactive (11/03/2023)   [facility] from [facility]  Social Connections: Moderately Isolated  (01/30/2024)  Stress: No Stress Concern Present (11/03/2023)   Received from [facility]  Tobacco Use: Medium Risk (01/28/2024)  Health Literacy: High Risk (11/03/2023)   Received from [facility]   SDOH Interventions: Patient resident at [facility].  Readmission Risk Interventions    10/12/2023   11:35 AM 08/25/2022    3:58 PM 07/15/2021    9:54 AM  Readmission Risk Prevention Plan  Transportation Screening Complete Complete   PCP or Specialist Appt within 3-5 Days   Complete  Home Care Screening Complete Complete   Medication Review (RN CM) Complete Complete   HRI or Home Care Consult   Complete  Social Work Consult for Recovery Care Planning/Counseling   Complete  Palliative Care Screening   Complete  Medication Review Oceanographer)   Complete

## 2024-01-30 NOTE — H&P (Signed)
 History and Physical    Patient: Jordan Ward NFA:[id number] DOB: May 15, 1941 DOA: 01/30/2024 DOS: the patient was seen and examined on 01/30/2024 PCP: Omie Bickers, MD  Patient coming from: SNF  Chief Complaint:  Chief Complaint  Patient presents with   Shortness of Breath   HPI: Jordan Ward is a 83 y.o. male with medical history significant of pulmonary fibrosis/COPD, CAD, prior stroke, multiple sclerosis with neurogenic bladder, has had a longstanding suprapubic tube  with atrophic right  kidney and a left percutaneous nephrostomy tube, hypothyroidism, who presents to the ED from [facility] with concerns for hypoxia. - In ED patient required up to 11 L of oxygen  via nasal cannula - Currently being weaned down - Additional history obtained from patient's wife at bedside - Reports fatigue, generalized weakness, productive cough with yellow to green sputum and worsening dyspnea No fever  Or chills  No Nausea, Vomiting or Diarrhea In ED--CXR--Chronic interstitial opacities with some improvement in the right basilar airspace component. --WBC 19.1 Hgb 10.2 which is close to baseline - MRSA PCR positive - UA suggestive of UTI - Creatinine up to 3.46 from a baseline usually between 1.7 and 2.0 - LFTs are not elevated  Review of Systems: As mentioned in the history of present illness. All other systems reviewed and are negative. Past Medical History:  Diagnosis Date   Anemia    Arthritis    Back pain, chronic    Bilateral carotid bruits    C. difficile colitis 09/2011   CAD (coronary artery disease)    Carotid artery stenosis    Cerebrovascular disease    Cerebrovascular disease 08/17/2018   Colon polyps    Complication of cystostomy catheter, initial encounter (HCC) 04/20/2020   Dyslipidemia    Dysphagia 10/07/2011   Encephalopathy    Gait disorder    HA (headache)    High grade dysplasia in colonic adenoma 09/2005   History of kidney stones    HTN (hypertension),  malignant 10/06/2011   Hypernatremia    Hypokalemia    Hypothyroidism 10/08/2011   Insomnia    Junctional rhythm    Kidney stones    MS (multiple sclerosis) (HCC)    Nausea and vomiting 03/02/2020   Neuromuscular disorder (HCC)    MS   OSA (obstructive sleep apnea)    Paroxysmal atrial tachycardia (HCC)    Peripheral vascular disease (HCC)    Pneumonia 4 yrs ago   Pulmonary fibrosis (HCC) 10/06/2011   Pulmonary nodule 10/08/2011   PVD (peripheral vascular disease) (HCC)    Sacral ulcer (HCC)    Sleep apnea    cannot tolerate   Stroke (HCC)    left sided weakness   Suprapubic catheter (HCC)    TIA (transient ischemic attack)    Tremors of nervous system 10/08/2011   Urinary tract infection    Ventral hernia without obstruction or gangrene     Large 8X9cm ventral hernia with loss of domain. CT reads report as diastasis recti with herniation or diastasis recti.  Dr. Collene Dawson, Surgery, reviewed CT with radiology and there is herniation with only hernia sac or peritoneum over the bowel and large separation of the rectus muslce (i.e. diastasis recti aka loss of domain).  No surgical intervention recommended given size, age, and health.    Past Surgical History:  Procedure Laterality Date   APPENDECTOMY  09/2005   at time of left hemicolectomy   BACK SURGERY  1976/1979   lower   BILIARY DILATION  N/A 03/03/2020   Procedure: BILIARY DILATION;  Surgeon: Ruby Corporal, MD;  [facility];  Service: Endoscopy;  Laterality: N/A;   CATARACT EXTRACTION W/PHACO Right 03/08/2018   Procedure: CATARACT EXTRACTION PHACO AND INTRAOCULAR LENS PLACEMENT RIGHT EYE;  Surgeon: Anner Kill, MD;  Location: [facility];  Service: Ophthalmology;  Laterality: Right;  CDE: 8.86   CATARACT EXTRACTION W/PHACO Left 04/05/2018   Procedure: CATARACT EXTRACTION PHACO AND INTRAOCULAR LENS PLACEMENT (IOC);  Surgeon: Anner Kill, MD;  Location: [facility];  Service: Ophthalmology;  Laterality: Left;  CDE: 7.36    CENTRAL LINE INSERTION-TUNNELED Right 09/11/2020   Procedure: PLACEMENT OF TUNNELED CENTRAL LINE INTO JUGULAR VEIN;  Surgeon: Awilda Bogus, MD;  Location: [facility];  Service: General;  Laterality: Right;   CHOLECYSTECTOMY     Dr. Felipe Horton   COLON SURGERY  09/2005   Fleishman: four tubular adenomas, large adenomatous polyp with HIGH GRADE dysplasia   COLONOSCOPY  11/2004   Dr. Ileana Mallard sessile polyp splenic flexure, 10mm sessile polyp desc colon, tubulovillous adenoma (bx not removed)   COLONOSCOPY  01/2005   poor prep, polyp could not be found   COLONOSCOPY  05/2005   with EMR, polypectomy Dr. Marylin So, bx showed high grade dysplasia, partially resected   COLONOSCOPY  09/2005   Dr. Hercules Lombard, [place] ink tattooing, four villous colon polyp (3 had been missed on previous colonoscopies due to limitations of procedures   COLONOSCOPY  09/2006   normal TI, no polyps   COLONOSCOPY  10/2007   Dr. Merrill Abide distal mammillations, benign bx, normal TI, random bx neg for microscopic colitis   CYSTOSCOPY W/ URETERAL STENT PLACEMENT Left 09/05/2020   Procedure: CYSTOSCOPY WITH RETROGRADE PYELOGRAM/URETERAL STENT PLACEMENT;  Surgeon: Andrez Banker, MD;  Location: [facility];  Service: Urology;  Laterality: Left;   CYSTOSCOPY WITH LITHOLAPAXY N/A 07/27/2018   Procedure: CYSTOSCOPY WITH LITHOLAPAXY VIA  SUPRAPUBIC TUBE;  Surgeon: Trent Frizzle, MD;  Location: [facility];  Service: Urology;  Laterality: N/A;   CYSTOSCOPY WITH RETROGRADE PYELOGRAM, URETEROSCOPY AND STENT PLACEMENT Left 06/09/2017   Procedure: CYSTOSCOPY WITH LEFT RETROGRADE PYELOGRAM, LEFT URETEROSCOPY, LEFT URETEROSCOPIC STONE EXTRACTION, LEFT URETERAL STENT PLACEMENT;  Surgeon: Trent Frizzle, MD;  Location: [facility];  Service: Urology;  Laterality: Left;   CYSTOSCOPY/URETEROSCOPY/HOLMIUM LASER/STENT PLACEMENT Left 05/22/2020   Procedure: CYSTOSCOPY/URETEROSCOPY/STENT PLACEMENT;  Surgeon: Trent Frizzle, MD;  [facility];   Service: Urology;  Laterality: Left;   ERCP N/A 03/03/2020   Procedure: ENDOSCOPIC RETROGRADE CHOLANGIOPANCREATOGRAPHY (ERCP);  Surgeon: Ruby Corporal, MD;  [facility];  Service: Endoscopy;  Laterality: N/A;   ERCP N/A 04/20/2020   Procedure: ENDOSCOPIC RETROGRADE CHOLANGIOPANCREATOGRAPHY (ERCP);  Surgeon: Ruby Corporal, MD;  [facility];  Service: Endoscopy;  Laterality: N/A;  to be done at 7:30am in OR   GASTROINTESTINAL STENT REMOVAL N/A 04/20/2020   Procedure: STENT REMOVAL;  Surgeon: Ruby Corporal, MD;  [facility];  Service: Endoscopy;  Laterality: N/A;   INGUINAL HERNIA REPAIR  1971   bilateral   INSERTION OF SUPRAPUBIC CATHETER  06/09/2017   Procedure: EXCHANGE OF SUPRAPUBIC CATHETER;  Surgeon: Trent Frizzle, MD;  Location: [facility];  Service: Urology;;   INSERTION OF SUPRAPUBIC CATHETER  05/22/2020   Procedure: SUPRAPUBIC CATHETER EXCHANGE;  Surgeon: Trent Frizzle, MD;  Location: [facility];  Service: Urology;;   IR NEPHROSTOMY EXCHANGE LEFT  03/25/2021   IR NEPHROSTOMY EXCHANGE LEFT  05/20/2021   IR NEPHROSTOMY EXCHANGE LEFT  07/13/2021  IR NEPHROSTOMY EXCHANGE LEFT  09/09/2021   IR NEPHROSTOMY EXCHANGE LEFT  11/04/2021   IR NEPHROSTOMY EXCHANGE LEFT  12/23/2021   IR NEPHROSTOMY EXCHANGE LEFT  02/17/2022   IR NEPHROSTOMY EXCHANGE LEFT  04/14/2022   IR NEPHROSTOMY EXCHANGE LEFT  06/09/2022   IR NEPHROSTOMY EXCHANGE LEFT  07/28/2022   IR NEPHROSTOMY EXCHANGE LEFT  09/09/2022   IR NEPHROSTOMY EXCHANGE LEFT  11/04/2022   IR NEPHROSTOMY EXCHANGE LEFT  12/30/2022   IR NEPHROSTOMY EXCHANGE LEFT  02/25/2023   IR NEPHROSTOMY EXCHANGE LEFT  03/31/2023   IR NEPHROSTOMY EXCHANGE LEFT  05/26/2023   IR NEPHROSTOMY EXCHANGE LEFT  07/21/2023   IR NEPHROSTOMY EXCHANGE LEFT  09/15/2023   IR NEPHROSTOMY EXCHANGE LEFT  12/22/2023   IR NEPHROSTOMY PLACEMENT LEFT  05/26/2017   IR NEPHROSTOMY PLACEMENT LEFT  05/02/2020   IR NEPHROSTOMY PLACEMENT LEFT  02/15/2021   IR  NEPHROSTOMY TUBE CHANGE  10/29/2023   IR URETERAL STENT PERC REMOVAL MOD SED  03/25/2021   KIDNEY STONE SURGERY  09/13/2015   LITHOTRIPSY N/A 03/03/2020   Procedure: MECHANICAL LITHOTRIPSY WITH REMOVAL OF MULTIPLE STONE FRAGMENTS;  Surgeon: Ruby Corporal, MD;  [facility];  Service: Endoscopy;  Laterality: N/A;   NEPHROLITHOTOMY Left 09/13/2015   Procedure: LEFT PERCUTANEOUS NEPHROLITHOTOMY ;  Surgeon: Trent Frizzle, MD;  Location: [facility];  Service: Urology;  Laterality: Left;   NEPHROSTOMY TUBE REMOVAL  05/22/2020   Procedure: NEPHROSTOMY TUBE REMOVAL;  Surgeon: Trent Frizzle, MD;  Location: [facility];  Service: Urology;;   [facility] REMOVAL Right 09/20/2020   Procedure: MINOR REMOVAL CENTRAL LINE;  Surgeon: Awilda Bogus, MD;  Location: [facility];  Service: General;  Laterality: Right;  Pt to arrive at 7:30am for procedure   REMOVAL OF STONES N/A 04/20/2020   Procedure: REMOVAL OF STONES;  Surgeon: Ruby Corporal, MD;  [facility];  Service: Endoscopy;  Laterality: N/A;   SPHINCTEROTOMY  03/03/2020   Procedure: BILLARY SPHINCTEROTOMY;  Surgeon: Ruby Corporal, MD;  [facility];  Service: Endoscopy;;   SUPRAPUBIC CATHETER INSERTION     Social History:  reports that he quit smoking about 35 years ago. His smoking use included cigarettes. He started smoking about 60 years ago. He has a 25 pack-year smoking history. He has never used smokeless tobacco. He reports that he does not drink alcohol  and does not use drugs.  Allergies  Allergen Reactions   Ciprofloxacin  Other (See Comments)    Trouble swallowing according to wife "[name] out in the floor"   Tetracyclines & Related Anaphylaxis and Rash    Family History  Problem Relation Age of Onset   Cirrhosis Brother        etoh   Stroke Mother 79   Coronary artery disease Father 96   Heart attack Brother    Cancer Sister    Multiple sclerosis Other    Colon cancer Neg Hx     Prior to  Admission medications   Medication Sig Start Date End Date Taking? Authorizing Provider  acetaminophen  (TYLENOL ) 325 MG tablet Take 650 mg by mouth every 6 (six) hours as needed for fever, mild pain (pain score 1-3) or moderate pain (pain score 4-6). Patient not taking: Reported on 01/28/2024    [provider]  amLODipine  (NORVASC ) 10 MG tablet Take 5 mg by mouth daily. 05/21/21   [provider]  ascorbic acid  (VITAMIN C) 500 MG tablet Take 500 mg by mouth daily.    [provider]  aspirin  81 MG chewable tablet Chew 81 mg by mouth daily.    [provider]  diazepam  (VALIUM ) 5 MG tablet Take 1 tablet (5 mg total) by mouth 2 (two) times daily. 10/29/23   Johnson, Clanford L, MD  dronabinol (MARINOL) 2.5 MG capsule Take 2.5 mg by mouth 2 (two) times daily before a meal.    [provider]  HYDROcodone -acetaminophen  (NORCO) 7.5-325 MG tablet Take 1 tablet by mouth 2 (two) times daily. Max APAP 3 GM IN 24 HOURS FROM ALL SOURCES Patient taking differently: Take 1 tablet by mouth 2 (two) times daily. Max dose of APAP is 3g in 24 hours 10/29/23   Johnson, Clanford L, MD  levothyroxine  (SYNTHROID ) 75 MCG tablet Take 75 mcg by mouth in the morning. 07/02/21   [provider]  melatonin 3 MG TABS tablet Take 3 mg by mouth at bedtime.    [provider]  methocarbamol (ROBAXIN) 500 MG tablet Take 500 mg by mouth 4 (four) times daily.    [provider]  mirabegron  ER (MYRBETRIQ ) 50 MG TB24 tablet Take 1 tablet (50 mg total) by mouth daily. 10/12/23   Mason Sole, Pratik D, DO  Multiple Vitamin (MULTIVITAMIN) tablet Take 1 tablet by mouth daily.    [provider]  oxybutynin  (DITROPAN ) 5 MG tablet Take 5 mg by mouth 3 (three) times daily.    [provider]  OXYGEN  Inhale 1 L into the lungs every evening.    [provider]  pantoprazole  (PROTONIX ) 40 MG tablet Take 1 tablet (40 mg total) by mouth daily. 10/12/23   Mason Sole,  Pratik D, DO  Potassium Bicarb-Citric Acid 20 MEQ TBEF Take by mouth.    [provider]  potassium chloride  SA (KLOR-CON ) 20 MEQ tablet Take 20 mEq by mouth daily. 07/02/21   [provider]  senna (SENOKOT) 8.6 MG tablet Take 1 tablet by mouth daily.    [provider]  thiamine  (VITAMIN B-1) 100 MG tablet Take 1 tablet (100 mg total) by mouth daily. 10/12/23   Mason Sole, Pratik D, DO  thiamine  (VITAMIN B1) 100 MG tablet Take 100 mg by mouth daily.    [provider]  zinc  gluconate 50 MG tablet Take 50 mg by mouth daily.    [provider]  zinc  sulfate, 50mg  elemental zinc , 220 (50 Zn) MG capsule Take 220 mg by mouth daily.    [provider]    Physical Exam: Vitals:   01/30/24 1245 01/30/24 1300 01/30/24 1315 01/30/24 1412  BP: (!) 141/81  131/73 (!) 143/77  Pulse: 72  75 84  Resp: (!) 24 20  20   Temp:      TempSrc:      SpO2: 100%  100% 95%  Weight:      Height:        Physical Exam Gen:- Awake Alert, in no acute distress , frail and chronically ill-appearing HEENT:- [place].AT, No sclera icterus Neck-Supple Neck,No JVD,.  Lungs-diminished breath sounds with few scattered wheezes CV- S1, S2 normal, RRR Abd-  +ve B.Sounds, Abd Soft, No tenderness, suprapubic catheter AND left-sided nephrostomy tube in situ Extremity/Skin:- No  edema,   good pedal pulses  Psych-affect is appropriate, oriented x3 Neuro-neuromuscular deficits consistent with underlying multiple sclerosis ,no additional new focal deficits, no tremors  Data Reviewed: In ED--CXR--Chronic interstitial opacities with some improvement in the right basilar airspace component. --WBC 19.1 Hgb 10.2 which is close to baseline - MRSA PCR positive -  UA suggestive of UTI - Creatinine up to 3.46 from a baseline usually between 1.7 and 2.0 - LFTs are not elevated  Assessment and Plan: 1)AKI----acute kidney injury on CKD stage -3B    creatinine on admission=3.46  ,  -baseline  creatinine =  baseline usually between 1.7 and 2.0  ,  --Continue IV fluids and encourage adequate oral intake --renally adjust medications, avoid nephrotoxic agents / dehydration  / hypotension  2)CAUTI--- patient with presumed complicated UTI -Patient has neurogenic bladder due to underlying multiple sclerosis, he has suprapubic catheter AND left-sided nephrostomy tube in situ -Patient with recent history of Pseudomonas UTI, as well as recent H/o E . Faecalis (MRSA swab positive today)  -- Pharmacy consult for meropenem  and vancomycin  pending further culture data  3) acute hypoxic respiratory failure--patient with underlying COPD/pulmonary fibrosis - Required up to 11 L of oxygen  via nasal cannula in the ED on admission - Currently being weaned down - Antibiotics as above in #2 - IV Solu-Medrol , - Mucolytics and bronchodilators as ordered  4) COPD/pulmonary fibrosis flareup--- management as above #3  5) GERD--- continue Protonix  especially with high-dose steroid therapy  6)FTT/Anorexia with moderate protein caloric malnutrition ---BMI 17 -Nutritional supplements encouraged -- PTA patient was on Marinol for appetite stimulation - Consider Remeron  7) hypothyroidism--- continue levothyroxine   8) multiple sclerosis--- continue muscle relaxants loading diazepam  and methocarbamol, continue Myrbetriq /oxybutynin  for neurogenic bladder and laxatives for neurogenic bowel    Advance Care Planning:   Code Status: Full Code   Consults: Palliative care consult  Family Communication: Discussed with wife at bedside  Severity of Illness: The appropriate patient status for this patient is INPATIENT. Inpatient status is judged to be reasonable and necessary in order to provide the required intensity of service to ensure the patient's safety. The patient's presenting symptoms, physical exam findings, and initial radiographic and laboratory data in the context of their chronic comorbidities is  felt to place them at high risk for further clinical deterioration. Furthermore, it is not anticipated that the patient will be medically stable for discharge from the hospital within 2 midnights of admission.   * I certify that at the point of admission it is my clinical judgment that the patient will require inpatient hospital care spanning beyond 2 midnights from the point of admission due to high intensity of service, high risk for further deterioration and high frequency of surveillance required.*  Author: Colin Dawley, MD 01/30/2024 6:25 PM  For on call review [email or web address].

## 2024-01-30 NOTE — Progress Notes (Signed)
   01/30/24 1817  TOC Assessment  TOC screening is complete Yes  Once discharged, how will the patient get to their discharge location? Ambulance  Expected Discharge Plan Skilled Nursing Facility  Final next level of care Skilled Nursing Facility  Barriers to Discharge Continued Medical Work up  [employer] ownership interest in [facility].provided to: Patient  Post Acute Care Choice Skilled Nursing Facility  Living arrangements for the past 2 months Skilled Nursing Facility  Lives with: Facility Resident  Permission granted to share information with  Yes, Verbal Permission Granted  Need for Family Participation in Patient Care Y  Care giver support system in place? Y  Appearance: Appears stated age  Attitude/Demeanor/Rapport Apprehensive  Affect (typically observed) Anxious  Orientation:  Oriented to Self;Oriented to Place;Oriented to  Time;Oriented to Situation   Admitted AKI (acute kidney injury) (HCC) . TOC  will continue to monitor patient advancement through interdisciplinary progression rounds.

## 2024-01-30 NOTE — Consult Note (Signed)
 Pharmacy Antibiotic Note  Jordan Ward is a 83 y.o. male admitted on 01/30/2024 with UTI.  Pharmacy has been consulted for Vancomycin  dosing. Patient admitted with AKI and Scr of 3.46 (CrCl 11.8)  Plan: Vancomycin  1000 mg IV x 1 dose given 5/3 @ 1120. Will order random level with AM labs and follow renal dosing until AKI improved/stable.  Height: 5\' 5"  (165.1 cm) Weight: 48.8 kg (107 lb 9.7 oz) IBW/kg (Calculated) : 61.5  Temp (24hrs), Avg:100.7 F (38.2 C), Min:99.5 F (37.5 C), Max:101.8 F (38.8 C)  Recent Labs  Lab 01/30/24 0951 01/30/24 1140  WBC 19.1*  --   CREATININE 3.46*  --   LATICACIDVEN 2.6* 1.4    Estimated Creatinine Clearance: 11.8 mL/min (A) (by C-G formula based on SCr of 3.46 mg/dL (H)).    Allergies  Allergen Reactions   Ciprofloxacin  Other (See Comments)    Trouble swallowing according to wife "Jordan Ward out in the floor"   Tetracyclines & Related Anaphylaxis and Rash    Antimicrobials this admission: meropenem  5/3 >>  Vancomycin  5/3 >>   [place] Gustafson Rodriguez-Guzman PharmD, BCPS 01/30/2024 7:02 PM

## 2024-01-31 ENCOUNTER — Encounter (HOSPITAL_COMMUNITY): Payer: Self-pay | Admitting: Family Medicine

## 2024-01-31 DIAGNOSIS — N179 Acute kidney failure, unspecified: Secondary | ICD-10-CM | POA: Diagnosis not present

## 2024-01-31 LAB — CBC
HCT: 30.5 % — ABNORMAL LOW (ref 39.0–52.0)
Hemoglobin: 9.4 g/dL — ABNORMAL LOW (ref 13.0–17.0)
MCH: 28.8 pg (ref 26.0–34.0)
MCHC: 30.8 g/dL (ref 30.0–36.0)
MCV: 93.6 fL (ref 80.0–100.0)
Platelets: 249 10*3/uL (ref 150–400)
RBC: 3.26 MIL/uL — ABNORMAL LOW (ref 4.22–5.81)
RDW: 15.9 % — ABNORMAL HIGH (ref 11.5–15.5)
WBC: 13.1 10*3/uL — ABNORMAL HIGH (ref 4.0–10.5)
nRBC: 0 % (ref 0.0–0.2)

## 2024-01-31 LAB — BLOOD CULTURE ID PANEL (REFLEXED) - BCID2

## 2024-01-31 LAB — BASIC METABOLIC PANEL WITH GFR
Anion gap: 10 (ref 5–15)
BUN: 62 mg/dL — ABNORMAL HIGH (ref 8–23)
CO2: 19 mmol/L — ABNORMAL LOW (ref 22–32)
Calcium: 8.1 mg/dL — ABNORMAL LOW (ref 8.9–10.3)
Chloride: 112 mmol/L — ABNORMAL HIGH (ref 98–111)
Creatinine, Ser: 3.02 mg/dL — ABNORMAL HIGH (ref 0.61–1.24)
GFR, Estimated: 20 mL/min — ABNORMAL LOW (ref >60)
Glucose, Bld: 158 mg/dL — ABNORMAL HIGH (ref 70–99)
Potassium: 4.1 mmol/L (ref 3.5–5.1)
Sodium: 141 mmol/L (ref 135–145)

## 2024-01-31 LAB — URINE CULTURE

## 2024-01-31 LAB — VANCOMYCIN, RANDOM: Vancomycin Rm: 14 ug/mL

## 2024-01-31 MED ORDER — SODIUM CHLORIDE 0.9 % IV SOLN
2.0000 g | INTRAVENOUS | Status: DC
  Administered 2024-01-31 – 2024-02-01 (×2): 2 g via INTRAVENOUS
  Filled 2024-01-31 (×2): qty 20

## 2024-01-31 MED ORDER — SODIUM CHLORIDE 0.9 % IV BOLUS
500.0000 mL | Freq: Once | INTRAVENOUS | Status: AC
  Administered 2024-01-31: 500 mL via INTRAVENOUS

## 2024-01-31 NOTE — Plan of Care (Signed)

## 2024-01-31 NOTE — Progress Notes (Signed)
 Date and time results received: 01/31/24    Test: blood culture Critical Value: 4 out of 4 tubes positive for E. Coli not resistant  Name of Provider Notified: Adefeso  Orders Received? Or Actions Taken?: pending orders

## 2024-01-31 NOTE — Progress Notes (Signed)
 PROGRESS NOTE   Jordan Ward, is a 83 y.o. male, DOB - 1941-06-08, JXB:[id number]  Admit date - 01/30/2024   Admitting Physician Jordan Mcclimans Quintella Buck, MD  Outpatient Primary MD for the patient is Jordan Bickers, MD  LOS - 1  Chief Complaint  Patient presents with   Shortness of Breath      Brief Narrative:  83 y.o. male with medical history significant of pulmonary fibrosis/COPD, CAD, prior stroke, multiple sclerosis with neurogenic bladder, has had a longstanding suprapubic tube  with atrophic right  kidney and a left percutaneous nephrostomy tube, hypothyroidism, who was admitted on 02/16/2024 from [facility] with E. coli UTI and bacteremia and acute hypoxic respiratory failure.    -Assessment and Plan: 1)AKI----acute kidney injury on CKD stage -3B    creatinine on admission=3.46  ,  -baseline creatinine =  baseline usually between 1.7 and 2.0  ,  -Creatinine improving with hydration --Continue IV fluids and encourage adequate oral intake --renally adjust medications, avoid nephrotoxic agents / dehydration  / hypotension   2) E. coli bacteremia secondary to E. coli CAUTI--- patient with presumed complicated UTI -Patient has neurogenic bladder due to underlying multiple sclerosis, he has suprapubic catheter AND left-sided nephrostomy tube in situ -Blood cultures from 01/30/2024 with E. coli in all 4 bottles -Okay to stop vancomycin  -E. coli appears not to be ESBL so Stop Meropenem  -Start Rocephin  instead   3) acute hypoxic respiratory failure--patient with underlying COPD/pulmonary fibrosis - Required up to 11 L of oxygen  via nasal cannula in the ED on admission - Currently being weaned down - Antibiotics as above in #2 - IV Solu-Medrol , - Mucolytics and bronchodilators as ordered   4) COPD/pulmonary fibrosis flareup--- management as above #3   5) GERD--- continue Protonix  especially with high-dose steroid therapy   6)FTT/Anorexia with moderate protein caloric malnutrition ---BMI  17 -Nutritional supplements encouraged -- PTA patient was on Marinol for appetite stimulation - c/n Remeron   7) hypothyroidism--- continue levothyroxine    8) multiple sclerosis--- continue muscle relaxants loading diazepam  and methocarbamol, continue Myrbetriq /oxybutynin  for neurogenic bladder and laxatives for neurogenic bowel    Status is: Inpatient   Disposition: The patient is from: SNF              Anticipated d/c is to: SNF              Anticipated d/c date is: 1 day              Patient currently is not medically stable to d/c. Barriers: Not Clinically Stable-   Code Status :  -  Code Status: Full Code   Family Communication:    (patient is alert, awake and coherent)  Discussed with wife at bedside  DVT Prophylaxis  :   - SCDs   heparin  injection 5,000 Units Start: 01/30/24 1400 SCDs Start: 01/30/24 1250 Place TED hose Start: 01/30/24 1250   Lab Results  Component Value Date   PLT 249 01/31/2024   Inpatient Medications  Scheduled Meds:  amLODipine   5 mg Oral Daily   aspirin   81 mg Oral Daily   dextromethorphan -guaiFENesin   1 tablet Oral BID   diazepam   5 mg Oral BID   dronabinol  2.5 mg Oral BID AC   feeding supplement  237 mL Oral BID BM   heparin   5,000 Units Subcutaneous Q8H   HYDROcodone -acetaminophen   1 tablet Oral BID   ipratropium-albuterol   3 mL Nebulization Q6H   levothyroxine   75 mcg Oral q AM  methocarbamol  500 mg Oral QID   methylPREDNISolone  (SOLU-MEDROL ) injection  40 mg Intravenous Q12H   mirabegron  ER  50 mg Oral Daily   mirtazapine  7.5 mg Oral QHS   oxybutynin   5 mg Oral TID   pantoprazole   40 mg Oral Daily   senna-docusate  2 tablet Oral QHS   sodium chloride  flush  3 mL Intravenous Q12H   sodium chloride  flush  3 mL Intravenous Q12H   vancomycin  variable dose per unstable renal function (pharmacist dosing)   Does not apply See admin instructions   Continuous Infusions:  sodium chloride  100 mL/hr at 01/31/24 0015   meropenem   (MERREM ) IV 500 mg (01/31/24 1517)   PRN Meds:.acetaminophen  **OR** acetaminophen , albuterol , bisacodyl , ondansetron  **OR** ondansetron  (ZOFRAN ) IV, polyethylene glycol, sodium chloride  flush   Anti-infectives (From admission, onward)    Start     Dose/Rate Route Frequency Ordered Stop   01/31/24 0200  meropenem  (MERREM ) 500 mg in sodium chloride  0.9 % 100 mL IVPB        500 mg 200 mL/hr over 30 Minutes Intravenous Every 12 hours 01/30/24 1337     01/30/24 1902  vancomycin  variable dose per unstable renal function (pharmacist dosing)         Does not apply See admin instructions 01/30/24 1902     01/30/24 1300  meropenem  (MERREM ) 1 g in sodium chloride  0.9 % 100 mL IVPB        1 g 200 mL/hr over 30 Minutes Intravenous  Once 01/30/24 1255 01/30/24 1415   01/30/24 1100  vancomycin  (VANCOCIN ) IVPB 1000 mg/200 mL premix        1,000 mg 200 mL/hr over 60 Minutes Intravenous  Once 01/30/24 1053 01/30/24 1222   01/30/24 1100  ceFEPIme  (MAXIPIME ) 2 g in sodium chloride  0.9 % 100 mL IVPB        2 g 200 mL/hr over 30 Minutes Intravenous  Once 01/30/24 1053 01/30/24 1149      Subjective: Jordan Ward today has no fevers, no emesis,  No chest pain,   - Wife at bedside, questions answered -Complains of fatigue and malaise  Objective: Vitals:   01/31/24 0439 01/31/24 0817 01/31/24 1300 01/31/24 1430  BP: 96/65  94/63   Pulse: 77  81   Resp: 18  18   Temp: 97.8 F (36.6 C)  98.6 F (37 C)   TempSrc: Oral  Oral   SpO2: 100% 95% 96% 92%  Weight:      Height:        Intake/Output Summary (Last 24 hours) at 01/31/2024 1840 Last data filed at 01/31/2024 1500 Gross per 24 hour  Intake 240 ml  Output 2150 ml  Net -1910 ml   Filed Weights   01/30/24 0929  Weight: 48.8 kg    Physical Exam Gen:- Awake Alert, chronically ill appearing, no acute distress HEENT:- Jordan Ward.AT, No sclera icterus Neck-Supple Neck,No JVD,.  Lungs-  CTAB , fair symmetrical air movement CV- S1, S2 normal, regular   Abd-  +ve B.Sounds, Abd Soft, No tenderness, suprapubic catheter and left-sided nephrostomy tube in situ Extremity/Skin:- No  edema, pedal pulses present  Psych-affect is appropriate, oriented x3 Neuro-neuromuscular deficits consistent with underlying multiple sclerosis ,no additional new focal deficits, no tremor  Data Reviewed: I have personally reviewed following labs and imaging studies  CBC: Recent Labs  Lab 01/30/24 0951 01/31/24 0513  WBC 19.1* 13.1*  NEUTROABS 17.5*  --   HGB 10.2* 9.4*  HCT 31.6* 30.5*  MCV 90.5 93.6  PLT 276 249   Basic Metabolic Panel: Recent Labs  Lab 01/30/24 0951 01/31/24 0513  NA 140 141  K 4.3 4.1  CL 109 112*  CO2 18* 19*  GLUCOSE 148* 158*  BUN 69* 62*  CREATININE 3.46* 3.02*  CALCIUM  8.8* 8.1*   GFR: Estimated Creatinine Clearance: 13.5 mL/min (A) (by C-G formula based on SCr of 3.02 mg/dL (H)). Liver Function Tests: Recent Labs  Lab 01/30/24 0951  AST 24  ALT 14  ALKPHOS 99  BILITOT 0.5  PROT 7.4  ALBUMIN 2.5*   Recent Results (from the past 240 hours)  Urine Culture     Status: Abnormal   Collection Time: 01/30/24  9:24 AM   Specimen: Urine, Random  Result Value Ref Range Status   Specimen Description   Final    URINE, RANDOM Performed at [facility], [address] [place], [place] [address]    Special Requests   Final    NONE Reflexed from [id number] Performed at [facility], [address], [place], [place] [address]    Culture MULTIPLE SPECIES PRESENT, SUGGEST RECOLLECTION (A)  Final   Report Status 01/31/2024 FINAL  Final  Blood Culture (routine x 2)     Status: None (Preliminary result)   Collection Time: 01/30/24  9:51 AM   Specimen: BLOOD RIGHT WRIST  Result Value Ref Range Status   Specimen Description   Final    BLOOD RIGHT WRIST BOTTLES DRAWN AEROBIC AND ANAEROBIC Performed at [facility], [address], [place], [place] [address]    Special Requests   Final    Blood Culture adequate  volume Performed at [facility], [address] [place], [place] [address]    Culture  Setup Time   Final    GRAM NEGATIVE RODS IN BOTH AEROBIC AND ANAEROBIC BOTTLES CRITICAL RESULT CALLED TO, READ BACK BY AND VERIFIED WITH: EVANS,T ON 01/30/24 AT 2217 BY PURDIE,J CRITICAL RESULT CALLED TO, READ BACK BY AND VERIFIED WITH: Twanna Galas RN 01/31/2024 @ 0244 BY AB Performed at [facility], [address], [place], [place] [address]    Culture GRAM NEGATIVE RODS  Final   Report Status PENDING  Incomplete  Blood Culture (routine x 2)     Status: None (Preliminary result)   Collection Time: 01/30/24  9:51 AM   Specimen: BLOOD LEFT ARM  Result Value Ref Range Status   Specimen Description BLOOD LEFT ARM BOTTLES DRAWN AEROBIC AND ANAEROBIC  Final   Special Requests   Final    Blood Culture results may not be optimal due to an inadequate volume of blood received in culture bottles   Culture  Setup Time   Final    GRAM NEGATIVE RODS IN BOTH AEROBIC AND ANAEROBIC BOTTLES CRITICAL RESULT CALLED TO, READ BACK BY AND VERIFIED WITH: EVANS,T ON 01/30/24 AT 2217 BY PURDIE,J Performed at [facility], [address] [place], [place] [address]    Culture GRAM NEGATIVE RODS  Final   Report Status PENDING  Incomplete  Blood Culture ID Panel (Reflexed)     Status: Abnormal   Collection Time: 01/30/24  9:51 AM  Result Value Ref Range Status   Enterococcus faecalis NOT DETECTED NOT DETECTED Final   Enterococcus Faecium NOT DETECTED NOT DETECTED Final   Listeria monocytogenes NOT DETECTED NOT DETECTED Final   Staphylococcus species NOT DETECTED NOT DETECTED Final   Staphylococcus aureus (BCID) NOT DETECTED NOT DETECTED Final   Staphylococcus epidermidis NOT DETECTED NOT  DETECTED Final   Staphylococcus lugdunensis NOT DETECTED NOT DETECTED Final   Streptococcus species NOT DETECTED NOT DETECTED Final   Streptococcus agalactiae NOT DETECTED NOT DETECTED Final   Streptococcus pneumoniae NOT DETECTED NOT  DETECTED Final   Streptococcus pyogenes NOT DETECTED NOT DETECTED Final   A.calcoaceticus-baumannii NOT DETECTED NOT DETECTED Final   Bacteroides fragilis NOT DETECTED NOT DETECTED Final   Enterobacterales DETECTED (A) NOT DETECTED Final    Comment: Enterobacterales represent a large order of gram negative bacteria, not a single organism. CRITICAL RESULT CALLED TO, READ BACK BY AND VERIFIED WITH: S WADE RN 01/31/2024 @ 0244 BY AB    Enterobacter cloacae complex NOT DETECTED NOT DETECTED Final   Escherichia coli DETECTED (A) NOT DETECTED Final    Comment: CRITICAL RESULT CALLED TO, READ BACK BY AND VERIFIED WITH: S WADE RN 01/31/2024 @ 0244 BY AB    Klebsiella aerogenes NOT DETECTED NOT DETECTED Final   Klebsiella oxytoca NOT DETECTED NOT DETECTED Final   Klebsiella pneumoniae NOT DETECTED NOT DETECTED Final   Proteus species NOT DETECTED NOT DETECTED Final   Salmonella species NOT DETECTED NOT DETECTED Final   Serratia marcescens NOT DETECTED NOT DETECTED Final   Haemophilus influenzae NOT DETECTED NOT DETECTED Final   Neisseria meningitidis NOT DETECTED NOT DETECTED Final   Pseudomonas aeruginosa NOT DETECTED NOT DETECTED Final   Stenotrophomonas maltophilia NOT DETECTED NOT DETECTED Final   Candida albicans NOT DETECTED NOT DETECTED Final   Candida auris NOT DETECTED NOT DETECTED Final   Candida glabrata NOT DETECTED NOT DETECTED Final   Candida krusei NOT DETECTED NOT DETECTED Final   Candida parapsilosis NOT DETECTED NOT DETECTED Final   Candida tropicalis NOT DETECTED NOT DETECTED Final   Cryptococcus neoformans/gattii NOT DETECTED NOT DETECTED Final   CTX-M ESBL NOT DETECTED NOT DETECTED Final   Carbapenem resistance IMP NOT DETECTED NOT DETECTED Final   Carbapenem resistance KPC NOT DETECTED NOT DETECTED Final   Carbapenem resistance NDM NOT DETECTED NOT DETECTED Final   Carbapenem resist OXA 48 LIKE NOT DETECTED NOT DETECTED Final   Carbapenem resistance VIM NOT DETECTED NOT  DETECTED Final    Comment: Performed at [facility], [address] [place], [place] [address]  MRSA Next Gen by PCR, Nasal     Status: Abnormal   Collection Time: 01/30/24  3:23 PM   Specimen: Nasal Mucosa; Nasal Swab  Result Value Ref Range Status   MRSA by PCR Next Gen DETECTED (A) NOT DETECTED Final    Comment: RESULT CALLED TO, READ BACK BY AND VERIFIED WITH: J BERES AT 1829 ON [id number] BY S DALTON (NOTE) The GeneXpert MRSA Assay (FDA approved for NASAL specimens only), is one component of a comprehensive MRSA colonization surveillance program. It is not intended to diagnose MRSA infection nor to guide or monitor treatment for MRSA infections. Test performance is not FDA approved in patients less than 67 years old. Performed at [facility], [address] [place], [place] [address]      Radiology Studies: [facility] 1 View Result Date: 01/30/2024 CLINICAL DATA:  Questionable sepsis - evaluate for abnormality EXAM: PORTABLE CHEST - 1 VIEW COMPARISON:  11/03/2023 FINDINGS: Coarse chronic appearing interstitial opacities right greater than left, with some improvement in the airspace component particularly the right lung base. No new infiltrate or overt edema. Heart size and mediastinal contours are within normal limits. Aortic Atherosclerosis (ICD10-170.0). No effusion. Visualized bones unremarkable. IMPRESSION: Chronic interstitial opacities with some improvement in the  right basilar airspace component. Electronically Signed   By: Nicoletta Barrier M.D.   On: 01/30/2024 10:23    Scheduled Meds:  amLODipine   5 mg Oral Daily   aspirin   81 mg Oral Daily   dextromethorphan -guaiFENesin   1 tablet Oral BID   diazepam   5 mg Oral BID   dronabinol  2.5 mg Oral BID AC   feeding supplement  237 mL Oral BID BM   heparin   5,000 Units Subcutaneous Q8H   HYDROcodone -acetaminophen   1 tablet Oral BID   ipratropium-albuterol   3 mL Nebulization Q6H   levothyroxine   75 mcg Oral q AM    methocarbamol  500 mg Oral QID   methylPREDNISolone  (SOLU-MEDROL ) injection  40 mg Intravenous Q12H   mirabegron  ER  50 mg Oral Daily   mirtazapine  7.5 mg Oral QHS   oxybutynin   5 mg Oral TID   pantoprazole   40 mg Oral Daily   senna-docusate  2 tablet Oral QHS   sodium chloride  flush  3 mL Intravenous Q12H   sodium chloride  flush  3 mL Intravenous Q12H   vancomycin  variable dose per unstable renal function (pharmacist dosing)   Does not apply See admin instructions   Continuous Infusions:  sodium chloride  100 mL/hr at 01/31/24 0015   meropenem  (MERREM ) IV 500 mg (01/31/24 1517)    LOS: 1 day   Colin Dawley M.D on 01/31/2024 at 6:40 PM  Go to [email or web address] - for contact info  [facility] - Office  [phone]  If 7PM-7AM, please contact night-coverage [email or web address] 01/31/2024, 6:40 PM

## 2024-02-01 ENCOUNTER — Encounter (HOSPITAL_COMMUNITY): Payer: Self-pay | Admitting: Family Medicine

## 2024-02-01 DIAGNOSIS — N179 Acute kidney failure, unspecified: Secondary | ICD-10-CM | POA: Diagnosis not present

## 2024-02-01 MED ORDER — CHLORHEXIDINE GLUCONATE CLOTH 2 % EX PADS
6.0000 | MEDICATED_PAD | Freq: Every day | CUTANEOUS | Status: DC
  Administered 2024-02-01 – 2024-02-02 (×2): 6 via TOPICAL

## 2024-02-01 MED ORDER — IPRATROPIUM-ALBUTEROL 0.5-2.5 (3) MG/3ML IN SOLN
3.0000 mL | Freq: Four times a day (QID) | RESPIRATORY_TRACT | Status: DC
Start: 2024-02-01 — End: 2024-02-01
  Administered 2024-02-01 (×3): 3 mL via RESPIRATORY_TRACT
  Filled 2024-02-01 (×3): qty 3

## 2024-02-01 MED ORDER — IPRATROPIUM-ALBUTEROL 0.5-2.5 (3) MG/3ML IN SOLN
3.0000 mL | Freq: Two times a day (BID) | RESPIRATORY_TRACT | Status: DC
  Administered 2024-02-02: 3 mL via RESPIRATORY_TRACT
  Filled 2024-02-01: qty 3

## 2024-02-01 MED ORDER — MUPIROCIN 2 % EX OINT
1.0000 | TOPICAL_OINTMENT | Freq: Two times a day (BID) | CUTANEOUS | Status: DC
  Administered 2024-02-01 – 2024-02-02 (×3): 1 via NASAL
  Filled 2024-02-01: qty 22

## 2024-02-01 NOTE — Care Management Important Message (Signed)
 Important Message  Patient Details  Name: Jordan Ward MRN: [id number] Date of Birth: [birth year]   Important Message Given:  N/A - LOS <3 / Initial given by admissions     Neila Bally 02/01/2024, 10:17 AM

## 2024-02-01 NOTE — Progress Notes (Signed)
 PROGRESS NOTE   Jordan Ward, is a 83 y.o. male, DOB - Feb 14, 1941, ZOX:[id number]  Admit date - 01/30/2024   Admitting Physician Ovide Dusek Quintella Buck, MD  Outpatient Primary MD for the patient is Omie Bickers, MD  LOS - 2  Chief Complaint  Patient presents with   Shortness of Breath      Brief Narrative:  83 y.o. male with medical history significant of pulmonary fibrosis/COPD, CAD, prior stroke, multiple sclerosis with neurogenic bladder, has had a longstanding suprapubic tube  with atrophic right  kidney and a left percutaneous nephrostomy tube, hypothyroidism, who was admitted on 02/16/2024 from [facility] with E. coli UTI and bacteremia and acute hypoxic respiratory failure.    -Assessment and Plan: 1)AKI----acute kidney injury on CKD stage -3B    creatinine on admission=3.46  ,  -baseline creatinine =  baseline usually between 1.7 and 2.0  ,  -Creatinine improving with hydration --Continue IV fluids and encourage adequate oral intake --renally adjust medications, avoid nephrotoxic agents / dehydration  / hypotension   2) E. coli bacteremia secondary to E. coli CAUTI--- patient with presumed complicated UTI -Patient has neurogenic bladder due to underlying multiple sclerosis, he has suprapubic catheter AND left-sided nephrostomy tube in situ -Blood cultures from 01/30/2024 with E. coli in all 4 bottles -Okay to stop vancomycin  -E. coli appears Not to be ESBL so Stopped Meropenem  -Started Rocephin  on 01/31/24----sensitivity still pending  -Per urologist Dr. Claretta Croft suprapubic catheter was last changed on 01/12/2024 he recommends no need to change at this time,  -He recommends outpatient follow-up with IR in 1 to 2 weeks for possible change of left flank nephrostomy tube   3) acute hypoxic respiratory failure--patient with underlying COPD/pulmonary fibrosis - Required up to 11 L of oxygen  via nasal cannula in the ED on admission - Currently being weaned down to 2L/min - Antibiotics as  above in #2 - c/n bronchodilators and iV Solu-Medrol , -Check chest x-ray - Mucolytics and bronchodilators as ordered   4) COPD/pulmonary fibrosis flareup--- management as above #3   5) GERD--- continue Protonix  especially with high-dose steroid therapy   6)FTT/Anorexia with moderate protein caloric malnutrition ---BMI 17 -Nutritional supplements encouraged -- PTA patient was on Marinol for appetite stimulation - c/n Remeron   7) hypothyroidism--- continue levothyroxine    8) multiple sclerosis--- continue muscle relaxants loading diazepam  and methocarbamol, continue Myrbetriq /oxybutynin  for neurogenic bladder and laxatives for neurogenic bowel    Status is: Inpatient   Disposition: The patient is from: SNF              Anticipated d/c is to: SNF              Anticipated d/c date is: 1 day              Patient currently is not medically stable to d/c. Barriers: Not Clinically Stable-   Code Status :  -  Code Status: Full Code   Family Communication:    (patient is alert, awake and coherent)  Discussed with wife at bedside  DVT Prophylaxis  :   - SCDs   heparin  injection 5,000 Units Start: 01/30/24 1400 SCDs Start: 01/30/24 1250 Place TED hose Start: 01/30/24 1250   Lab Results  Component Value Date   PLT 249 01/31/2024   Inpatient Medications  Scheduled Meds:  amLODipine   5 mg Oral Daily   aspirin   81 mg Oral Daily   Chlorhexidine  Gluconate Cloth  6 each Topical Daily   dextromethorphan -guaiFENesin   1 tablet  Oral BID   diazepam   5 mg Oral BID   dronabinol  2.5 mg Oral BID AC   feeding supplement  237 mL Oral BID BM   heparin   5,000 Units Subcutaneous Q8H   HYDROcodone -acetaminophen   1 tablet Oral BID   ipratropium-albuterol   3 mL Nebulization Q6H WA   levothyroxine   75 mcg Oral q AM   methocarbamol  500 mg Oral QID   methylPREDNISolone  (SOLU-MEDROL ) injection  40 mg Intravenous Q12H   mirabegron  ER  50 mg Oral Daily   mirtazapine  7.5 mg Oral QHS   mupirocin   ointment  1 Application Nasal BID   oxybutynin   5 mg Oral TID   pantoprazole   40 mg Oral Daily   senna-docusate  2 tablet Oral QHS   sodium chloride  flush  3 mL Intravenous Q12H   sodium chloride  flush  3 mL Intravenous Q12H   Continuous Infusions:  sodium chloride  100 mL/hr at 02/01/24 0447   cefTRIAXone  (ROCEPHIN )  IV 2 g (01/31/24 1951)   PRN Meds:.acetaminophen  **OR** acetaminophen , albuterol , bisacodyl , ondansetron  **OR** ondansetron  (ZOFRAN ) IV, polyethylene glycol, sodium chloride  flush   Anti-infectives (From admission, onward)    Start     Dose/Rate Route Frequency Ordered Stop   01/31/24 1945  cefTRIAXone  (ROCEPHIN ) 2 g in sodium chloride  0.9 % 100 mL IVPB        2 g 200 mL/hr over 30 Minutes Intravenous Every 24 hours 01/31/24 1848     01/31/24 0200  meropenem  (MERREM ) 500 mg in sodium chloride  0.9 % 100 mL IVPB  Status:  Discontinued        500 mg 200 mL/hr over 30 Minutes Intravenous Every 12 hours 01/30/24 1337 01/31/24 1848   01/30/24 1902  vancomycin  variable dose per unstable renal function (pharmacist dosing)  Status:  Discontinued         Does not apply See admin instructions 01/30/24 1902 01/31/24 1848   01/30/24 1300  meropenem  (MERREM ) 1 g in sodium chloride  0.9 % 100 mL IVPB        1 g 200 mL/hr over 30 Minutes Intravenous  Once 01/30/24 1255 01/30/24 1415   01/30/24 1100  vancomycin  (VANCOCIN ) IVPB 1000 mg/200 mL premix        1,000 mg 200 mL/hr over 60 Minutes Intravenous  Once 01/30/24 1053 01/30/24 1222   01/30/24 1100  ceFEPIme  (MAXIPIME ) 2 g in sodium chloride  0.9 % 100 mL IVPB        2 g 200 mL/hr over 30 Minutes Intravenous  Once 01/30/24 1053 01/30/24 1149      Subjective: Jordan Ward today has no fevers, no emesis,  No chest pain,   - -Hypoxia improved but did not resolve - E. coli sensitivities still pending  Objective: Vitals:   02/01/24 0410 02/01/24 0743 02/01/24 1411 02/01/24 1439  BP: 130/82   93/79  Pulse: 85   96  Resp: 20   20   Temp: 98 F (36.7 C)   98 F (36.7 C)  TempSrc: Oral   Oral  SpO2: 93% 96% 96% 93%  Weight:      Height:        Intake/Output Summary (Last 24 hours) at 02/01/2024 1556 Last data filed at 02/01/2024 1438 Gross per 24 hour  Intake 1848.57 ml  Output 2050 ml  Net -201.43 ml   Filed Weights   01/30/24 0929  Weight: 48.8 kg    Physical Exam Gen:- Awake Alert, chronically ill appearing, no acute distress HEENT:- [place].AT,  No sclera icterus Neck-Supple Neck,No JVD,.  Lungs-  CTAB , fair symmetrical air movement CV- S1, S2 normal, regular  Abd-  +ve B.Sounds, Abd Soft, No tenderness, suprapubic catheter and left-sided nephrostomy tube in situ Extremity/Skin:- No  edema, pedal pulses present  Psych-affect is appropriate, oriented x3 Neuro-neuromuscular deficits consistent with underlying multiple sclerosis ,no additional new focal deficits, no tremor  Data Reviewed: I have personally reviewed following labs and imaging studies  CBC: Recent Labs  Lab 01/30/24 0951 01/31/24 0513  WBC 19.1* 13.1*  NEUTROABS 17.5*  --   HGB 10.2* 9.4*  HCT 31.6* 30.5*  MCV 90.5 93.6  PLT 276 249   Basic Metabolic Panel: Recent Labs  Lab 01/30/24 0951 01/31/24 0513  NA 140 141  K 4.3 4.1  CL 109 112*  CO2 18* 19*  GLUCOSE 148* 158*  BUN 69* 62*  CREATININE 3.46* 3.02*  CALCIUM  8.8* 8.1*   GFR: Estimated Creatinine Clearance: 13.5 mL/min (A) (by C-G formula based on SCr of 3.02 mg/dL (H)). Liver Function Tests: Recent Labs  Lab 01/30/24 0951  AST 24  ALT 14  ALKPHOS 99  BILITOT 0.5  PROT 7.4  ALBUMIN 2.5*   Recent Results (from the past 240 hours)  Urine Culture     Status: Abnormal   Collection Time: 01/30/24  9:24 AM   Specimen: Urine, Random  Result Value Ref Range Status   Specimen Description   Final    URINE, RANDOM Performed at [facility], [address], [place], [place] [address]    Special Requests   Final    NONE Reflexed from [id number] Performed at [facility], [address], [place], [place] [address]    Culture MULTIPLE SPECIES PRESENT, SUGGEST RECOLLECTION (A)  Final   Report Status 01/31/2024 FINAL  Final  Blood Culture (routine x 2)     Status: Abnormal (Preliminary result)   Collection Time: 01/30/24  9:51 AM   Specimen: BLOOD RIGHT WRIST  Result Value Ref Range Status   Specimen Description   Final    BLOOD RIGHT WRIST BOTTLES DRAWN AEROBIC AND ANAEROBIC Performed at [facility], [address] [place], [place] [address]    Special Requests   Final    Blood Culture adequate volume Performed at [facility], [address] [place], [place] [address]    Culture  Setup Time   Final    GRAM NEGATIVE RODS IN BOTH AEROBIC AND ANAEROBIC BOTTLES CRITICAL RESULT CALLED TO, READ BACK BY AND VERIFIED WITH: EVANS,T ON 01/30/24 AT 2217 BY PURDIE,J CRITICAL RESULT CALLED TO, READ BACK BY AND VERIFIED WITH: Twanna Galas RN 01/31/2024 @ 0244 BY AB Performed at [facility], [address], [place], [place] [address]    Culture ESCHERICHIA COLI (A)  Final   Report Status PENDING  Incomplete  Blood Culture (routine x 2)     Status: None (Preliminary result)   Collection Time: 01/30/24  9:51 AM   Specimen: BLOOD LEFT ARM  Result Value Ref Range Status   Specimen Description BLOOD LEFT ARM BOTTLES DRAWN AEROBIC AND ANAEROBIC  Final   Special Requests   Final    Blood Culture results may not be optimal due to an inadequate volume of blood received in culture bottles   Culture  Setup Time   Final    GRAM NEGATIVE RODS IN BOTH AEROBIC AND ANAEROBIC BOTTLES CRITICAL RESULT CALLED TO, READ BACK BY AND VERIFIED WITH: EVANS,T ON 01/30/24 AT 2217 BY PURDIE,J Performed  at [facility], [address] [place], [place] [address]    Culture GRAM NEGATIVE RODS  Final   Report Status PENDING  Incomplete  Blood Culture ID Panel (Reflexed)     Status: Abnormal   Collection Time: 01/30/24  9:51 AM  Result Value Ref Range Status   Enterococcus faecalis NOT  DETECTED NOT DETECTED Final   Enterococcus Faecium NOT DETECTED NOT DETECTED Final   Listeria monocytogenes NOT DETECTED NOT DETECTED Final   Staphylococcus species NOT DETECTED NOT DETECTED Final   Staphylococcus aureus (BCID) NOT DETECTED NOT DETECTED Final   Staphylococcus epidermidis NOT DETECTED NOT DETECTED Final   Staphylococcus lugdunensis NOT DETECTED NOT DETECTED Final   Streptococcus species NOT DETECTED NOT DETECTED Final   Streptococcus agalactiae NOT DETECTED NOT DETECTED Final   Streptococcus pneumoniae NOT DETECTED NOT DETECTED Final   Streptococcus pyogenes NOT DETECTED NOT DETECTED Final   A.calcoaceticus-baumannii NOT DETECTED NOT DETECTED Final   Bacteroides fragilis NOT DETECTED NOT DETECTED Final   Enterobacterales DETECTED (A) NOT DETECTED Final    Comment: Enterobacterales represent a large order of gram negative bacteria, not a single organism. CRITICAL RESULT CALLED TO, READ BACK BY AND VERIFIED WITH: S WADE RN 01/31/2024 @ 0244 BY AB    Enterobacter cloacae complex NOT DETECTED NOT DETECTED Final   Escherichia coli DETECTED (A) NOT DETECTED Final    Comment: CRITICAL RESULT CALLED TO, READ BACK BY AND VERIFIED WITH: S WADE RN 01/31/2024 @ 0244 BY AB    Klebsiella aerogenes NOT DETECTED NOT DETECTED Final   Klebsiella oxytoca NOT DETECTED NOT DETECTED Final   Klebsiella pneumoniae NOT DETECTED NOT DETECTED Final   Proteus species NOT DETECTED NOT DETECTED Final   Salmonella species NOT DETECTED NOT DETECTED Final   Serratia marcescens NOT DETECTED NOT DETECTED Final   Haemophilus influenzae NOT DETECTED NOT DETECTED Final   Neisseria meningitidis NOT DETECTED NOT DETECTED Final   Pseudomonas aeruginosa NOT DETECTED NOT DETECTED Final   Stenotrophomonas maltophilia NOT DETECTED NOT DETECTED Final   Candida albicans NOT DETECTED NOT DETECTED Final   Candida auris NOT DETECTED NOT DETECTED Final   Candida glabrata NOT DETECTED NOT DETECTED Final   Candida krusei  NOT DETECTED NOT DETECTED Final   Candida parapsilosis NOT DETECTED NOT DETECTED Final   Candida tropicalis NOT DETECTED NOT DETECTED Final   Cryptococcus neoformans/gattii NOT DETECTED NOT DETECTED Final   CTX-M ESBL NOT DETECTED NOT DETECTED Final   Carbapenem resistance IMP NOT DETECTED NOT DETECTED Final   Carbapenem resistance KPC NOT DETECTED NOT DETECTED Final   Carbapenem resistance NDM NOT DETECTED NOT DETECTED Final   Carbapenem resist OXA 48 LIKE NOT DETECTED NOT DETECTED Final   Carbapenem resistance VIM NOT DETECTED NOT DETECTED Final    Comment: Performed at [facility], [address], [place], [place] [address]  MRSA Next Gen by PCR, Nasal     Status: Abnormal   Collection Time: 01/30/24  3:23 PM   Specimen: Nasal Mucosa; Nasal Swab  Result Value Ref Range Status   MRSA by PCR Next Gen DETECTED (A) NOT DETECTED Final    Comment: RESULT CALLED TO, READ BACK BY AND VERIFIED WITH: J BERES AT 1829 ON [id number] BY S DALTON (NOTE) The GeneXpert MRSA Assay (FDA approved for NASAL specimens only), is one component of a comprehensive MRSA colonization surveillance program. It is not intended to diagnose MRSA infection nor to guide or monitor treatment for MRSA infections. Test performance is not FDA approved  in patients less than 28 years old. Performed at [facility], [address] [place], [place] [address]      Radiology Studies: No results found.   Scheduled Meds:  amLODipine   5 mg Oral Daily   aspirin   81 mg Oral Daily   Chlorhexidine  Gluconate Cloth  6 each Topical Daily   dextromethorphan -guaiFENesin   1 tablet Oral BID   diazepam   5 mg Oral BID   dronabinol  2.5 mg Oral BID AC   feeding supplement  237 mL Oral BID BM   heparin   5,000 Units Subcutaneous Q8H   HYDROcodone -acetaminophen   1 tablet Oral BID   ipratropium-albuterol   3 mL Nebulization Q6H WA   levothyroxine   75 mcg Oral q AM   methocarbamol  500 mg Oral QID   methylPREDNISolone   (SOLU-MEDROL ) injection  40 mg Intravenous Q12H   mirabegron  ER  50 mg Oral Daily   mirtazapine  7.5 mg Oral QHS   mupirocin  ointment  1 Application Nasal BID   oxybutynin   5 mg Oral TID   pantoprazole   40 mg Oral Daily   senna-docusate  2 tablet Oral QHS   sodium chloride  flush  3 mL Intravenous Q12H   sodium chloride  flush  3 mL Intravenous Q12H   Continuous Infusions:  sodium chloride  100 mL/hr at 02/01/24 0447   cefTRIAXone  (ROCEPHIN )  IV 2 g (01/31/24 1951)    LOS: 2 days   Colin Dawley M.D on 02/01/2024 at 3:56 PM  Go to [email or web address] - for contact info  [facility] - Office  [phone]  If 7PM-7AM, please contact night-coverage [email or web address] 02/01/2024, 3:56 PM

## 2024-02-01 NOTE — Consult Note (Signed)
 Consultation Note Date: 02/01/2024   Patient Name: Jordan Ward  DOB: 06-Feb-1941  MRN: [id number]  Age / Sex: 83 y.o., male  PCP: Jordan Bickers, MD Referring Physician: Colin Dawley, MD  Reason for Consultation: Establishing goals of care  HPI/Patient Profile: 83 y.o. male  with past medical history of pulmonary fibrosis/COPD, CAD, prior stroke, multiple sclerosis with neurogenic bladder, has had a longstanding suprapubic tube  with atrophic right  kidney and a left percutaneous nephrostomy tube, hypothyroidism, who was admitted on 02/16/2024 from [facility] with E. coli UTI and bacteremia and acute hypoxic respiratory failure.  admitted on 01/30/2024 with acute kidney injury on CKD 3, E. coli bacteremia secondary to E. coli CAUTI.   Clinical Assessment and Goals of Care: I have reviewed medical records including EPIC notes, labs and imaging, received report from RN, assessed the patient.  [name] is lying quietly in bed.  He appears acutely/chronically ill and frail.  He greets me, making and somewhat keeping eye contact.  He is alert and oriented, able to make his needs known.  His wife of 60 years in June, Jordan Ward, is present at bedside.  We meet at the bedside to discuss diagnosis prognosis, GOC, EOL wishes, disposition and options. I introduced Palliative Medicine as specialized medical care for people living with serious illness. It focuses on providing relief from the symptoms and stress of a serious illness. The goal is to improve quality of life for both the patient and the family.  We discussed a brief life review of the patient.  Mr. and Mrs. Kings will have been married 60 years in June of this year.  He tells me that he was diagnosed with MS in the 9s.  We then focused on their current illness.  We talked about acute kidney injury and the treatment plan.  We talked about suprapubic tube with right  kidney atrophy and left PERC nephrostomy tube.  We talked about the importance of following up with doctors.  They shared that they have trust the doctors who they see regularly.  The natural disease trajectory and expectations at EOL were discussed.  Advanced directives, concepts specific to code status, artifical feeding and hydration, and rehospitalization were considered and discussed.  We talked about the concept of "treat the treatable, but allow a natural passing.  Mrs. Kreger states that her husband has legal paperwork completed which states he would want attempted resuscitation but no long-term life support.  I shared that I will note this in our chart.  I encouraged Mr. Sofie Dunning to continue to think about what he does and does not want as this may change as time goes forward.  He states agreement.  Hospice and Palliative Care services outpatient were explained and offered.  Mrs. Primo states that she has been receiving phone calls from outpatient palliative service team member, "Jordan Ward".  She is unable to tell me what company provides the services but is agreeable to continue this service.  Discussed the importance of continued conversation with family  and the medical providers regarding overall plan of care and treatment options, ensuring decisions are within the context of the patient's values and GOCs.  Questions and concerns were addressed.  Hard Choices booklet left for review. The family was encouraged to call with questions or concerns.  PMT will continue to support holistically.  Conference with attending, bedside nursing staff, transition of care team related to patient condition, needs, goals of care, disposition.    HCPOA  HCPOA -Mr. and Mrs. Faulstich share that Mrs. Mankin, Jordan Ward, is legal healthcare power of attorney.  She is NOT legal guardian.    SUMMARY OF RECOMMENDATIONS   Continue full scope/full code No long-term life support Outpatient palliative services   Code Status/Advance  Care Planning: Full code - We talked about the concept of "treat the treatable, but allow a natural passing.  Mrs. Lempke states that her husband has legal paperwork completed which states he would want attempted resuscitation but no long-term life support.  I shared that I will note this in our chart.  I encouraged Mr. Sofie Dunning to continue to think about what he does and does not want as this may change as time goes forward.  He states agreement.  Symptom Management:  Per hospitalist, no additional needs at this time.  Palliative Prophylaxis:  Frequent Pain Assessment, Oral Care, and Turn Reposition  Additional Recommendations (Limitations, Scope, Preferences): Full Scope Treatment  Psycho-social/Spiritual:  Desire for further Chaplaincy support:no Additional Recommendations: Caregiving  Support/Resources and Education on Hospice  Prognosis:  Unable to determine, based on outcomes.  6 months or less would be anticipated based on chronic illness burden, decreasing functional status, 3 hospital stays in the last 6 months.  Discharge Planning: Skilled Nursing Facility for rehab with Palliative care service follow-up      Primary Diagnoses: Present on Admission:  AKI (acute kidney injury) (HCC)  Ventral hernia without obstruction or gangrene  UTI (urinary tract infection)  Anxiety  Hypothyroidism  Multiple sclerosis (HCC)   I have reviewed the medical record, interviewed the patient and family, and examined the patient. The following aspects are pertinent.  Past Medical History:  Diagnosis Date   Anemia    Arthritis    Back pain, chronic    Bilateral carotid bruits    C. difficile colitis 09/2011   CAD (coronary artery disease)    Carotid artery stenosis    Cerebrovascular disease    Cerebrovascular disease 08/17/2018   Colon polyps    Complication of cystostomy catheter, initial encounter (HCC) 04/20/2020   Dyslipidemia    Dysphagia 10/07/2011   Encephalopathy    Gait  disorder    HA (headache)    High grade dysplasia in colonic adenoma 09/2005   History of kidney stones    HTN (hypertension), malignant 10/06/2011   Hypernatremia    Hypokalemia    Hypothyroidism 10/08/2011   Insomnia    Junctional rhythm    Kidney stones    MS (multiple sclerosis) (HCC)    Nausea and vomiting 03/02/2020   Neuromuscular disorder (HCC)    MS   OSA (obstructive sleep apnea)    Paroxysmal atrial tachycardia (HCC)    Peripheral vascular disease (HCC)    Pneumonia 4 yrs ago   Pulmonary fibrosis (HCC) 10/06/2011   Pulmonary nodule 10/08/2011   PVD (peripheral vascular disease) (HCC)    Sacral ulcer (HCC)    Sleep apnea    cannot tolerate   Stroke (HCC)    left sided weakness   Suprapubic  catheter (HCC)    TIA (transient ischemic attack)    Tremors of nervous system 10/08/2011   Urinary tract infection    Ventral hernia without obstruction or gangrene     Large 8X9cm ventral hernia with loss of domain. CT reads report as diastasis recti with herniation or diastasis recti.  Dr. Collene Dawson, Surgery, reviewed CT with radiology and there is herniation with only hernia sac or peritoneum over the bowel and large separation of the rectus muslce (i.e. diastasis recti aka loss of domain).  No surgical intervention recommended given size, age, and health.    Social History   Socioeconomic History   Marital status: Married    Spouse name: Jordan Ward   Number of children: 2   Years of education: 10   Highest education level: Not on file  Occupational History   Occupation: disability    Comment: [employer]    Employer: RETIRED  Tobacco Use   Smoking status: Former    Current packs/day: 0.00    Average packs/day: 1 pack/day for 25.0 years (25.0 ttl pk-yrs)    Types: Cigarettes    Start date: 03/04/1963    Quit date: 03/03/1988    Years since quitting: 35.9   Smokeless tobacco: Never  Vaping Use   Vaping status: Never Used  Substance and Sexual Activity   Alcohol  use:  No   Drug use: No   Sexual activity: Not on file  Other Topics Concern   Not on file  Social History Narrative   Patient is married Jordan Ward) and  Lives w/ wife and daughter Genevia Kern   Drinks 1-2 cups caffeine daily    Patient is left handed.    Social Drivers of Corporate investment banker Strain: Low Risk  (11/03/2023)   Received from [facility]   Overall Financial Resource Strain (CARDIA)    Difficulty of Paying Living Expenses: Not very hard  Food Insecurity: No Food Insecurity (01/30/2024)   Hunger Vital Sign    Worried About Running Out of Food in the Last Year: Never true    Ran Out of Food in the Last Year: Never true  Recent Concern: Food Insecurity - Food Insecurity Present (11/03/2023)   Received from [facility]   Hunger Vital Sign    Worried About Running Out of Food in the Last Year: Sometimes true    Ran Out of Food in the Last Year: Sometimes true  Transportation Needs: No Transportation Needs (01/30/2024)   PRAPARE - Administrator, Civil Service (Medical): No    Lack of Transportation (Non-Medical): No  Physical Activity: Inactive (11/03/2023)   Received [facility]   Exercise Vital Sign    Days of Exercise per Week: 0 days    Minutes of Exercise per Session: 0 min  Stress: No Stress Concern Present (11/03/2023)   Received from [facility] of Occupational Health - Occupational Stress Questionnaire    Feeling of Stress : Only a little  Social Connections: Moderately Isolated (01/30/2024)   Social Connection and Isolation Panel [NHANES]    Frequency of Communication with Friends and Family: More than three times a week    Frequency of Social Gatherings with Friends and Family: Once a week    Attends Religious Services: Never    Database administrator or Organizations: No    Attends Banker Meetings: Never    Marital Status: Married   Family History  Problem Relation Age of Onset   Cirrhosis Brother         etoh   Stroke Mother 4   Coronary artery disease Father 94   Heart attack Brother    Cancer Sister    Multiple sclerosis Other    Colon cancer Neg Hx    Scheduled Meds:  amLODipine   5 mg Oral Daily   aspirin   81 mg Oral Daily   dextromethorphan -guaiFENesin   1 tablet Oral BID   diazepam   5 mg Oral BID   dronabinol  2.5 mg Oral BID AC   feeding supplement  237 mL Oral BID BM   heparin   5,000 Units Subcutaneous Q8H   HYDROcodone -acetaminophen   1 tablet Oral BID   ipratropium-albuterol   3 mL Nebulization Q6H WA   levothyroxine   75 mcg Oral q AM   methocarbamol  500 mg Oral QID   methylPREDNISolone  (SOLU-MEDROL ) injection  40 mg Intravenous Q12H   mirabegron  ER  50 mg Oral Daily   mirtazapine  7.5 mg Oral QHS   oxybutynin   5 mg Oral TID   pantoprazole   40 mg Oral Daily   senna-docusate  2 tablet Oral QHS   sodium chloride  flush  3 mL Intravenous Q12H   sodium chloride  flush  3 mL Intravenous Q12H   Continuous Infusions:  sodium chloride  100 mL/hr at 02/01/24 0447   cefTRIAXone  (ROCEPHIN )  IV 2 g (01/31/24 1951)   PRN Meds:.acetaminophen  **OR** acetaminophen , albuterol , bisacodyl , ondansetron  **OR** ondansetron  (ZOFRAN ) IV, polyethylene glycol, sodium chloride  flush Medications Prior to Admission:  Prior to Admission medications   Medication Sig Start Date End Date Taking? Authorizing Provider  acetaminophen  (TYLENOL ) 325 MG tablet Take 650 mg by mouth every 6 (six) hours as needed for fever, mild pain (pain score 1-3) or moderate pain (pain score 4-6). Patient not taking: Reported on 01/28/2024    [provider]  amLODipine  (NORVASC ) 10 MG tablet Take 5 mg by mouth daily. 05/21/21   [provider]  ascorbic acid  (VITAMIN C) 500 MG tablet Take 500 mg by mouth daily.    [provider]  aspirin  81 MG chewable tablet Chew 81 mg by mouth daily.    [provider]  diazepam  (VALIUM ) 5 MG tablet Take 1 tablet (5 mg total) by mouth 2 (two) times  daily. 10/29/23   Johnson, Clanford L, MD  dronabinol (MARINOL) 2.5 MG capsule Take 2.5 mg by mouth 2 (two) times daily before a meal.    [provider]  HYDROcodone -acetaminophen  (NORCO) 7.5-325 MG tablet Take 1 tablet by mouth 2 (two) times daily. Max APAP 3 GM IN 24 HOURS FROM ALL SOURCES Patient taking differently: Take 1 tablet by mouth 2 (two) times daily. Max dose of APAP is 3g in 24 hours 10/29/23   Johnson, Clanford L, MD  levothyroxine  (SYNTHROID ) 75 MCG tablet Take 75 mcg by mouth in the morning. 07/02/21   [provider]  melatonin 3 MG TABS tablet Take 3 mg by mouth at bedtime.    [provider]  methocarbamol (ROBAXIN) 500 MG tablet Take 500 mg by mouth 4 (four) times daily.    [provider]  mirabegron  ER (MYRBETRIQ ) 50 MG TB24 tablet Take 1 tablet (50 mg total) by mouth daily. 10/12/23   Mason Sole, Pratik D, DO  Multiple Vitamin (MULTIVITAMIN) tablet Take 1 tablet by mouth daily.    [provider]  oxybutynin  (DITROPAN ) 5 MG tablet Take 5 mg by mouth 3 (three) times daily.  [provider]  OXYGEN  Inhale 1 L into the lungs every evening.    [provider]  pantoprazole  (PROTONIX ) 40 MG tablet Take 1 tablet (40 mg total) by mouth daily. 10/12/23   Mason Sole, Pratik D, DO  Potassium Bicarb-Citric Acid 20 MEQ TBEF Take by mouth.    [provider]  potassium chloride  SA (KLOR-CON ) 20 MEQ tablet Take 20 mEq by mouth daily. 07/02/21   [provider]  senna (SENOKOT) 8.6 MG tablet Take 1 tablet by mouth daily.    [provider]  thiamine  (VITAMIN B-1) 100 MG tablet Take 1 tablet (100 mg total) by mouth daily. 10/12/23   Mason Sole, Pratik D, DO  thiamine  (VITAMIN B1) 100 MG tablet Take 100 mg by mouth daily.    [provider]  zinc  gluconate 50 MG tablet Take 50 mg by mouth daily.    [provider]  zinc  sulfate, 50mg  elemental zinc , 220 (50 Zn) MG capsule Take 220 mg by mouth daily.     [provider]   Allergies  Allergen Reactions   Ciprofloxacin  Other (See Comments)    Trouble swallowing according to wife "[name] out in the floor"   Tetracyclines & Related Anaphylaxis and Rash   Review of Systems  Unable to perform ROS: Age    Physical Exam Vitals and nursing note reviewed.  Cardiovascular:     Rate and Rhythm: Normal rate.  Pulmonary:     Effort: Pulmonary effort is normal. No tachypnea.  Skin:    General: Skin is warm and dry.  Neurological:     Mental Status: He is alert and oriented to person, place, and time.  Psychiatric:        Mood and Affect: Mood normal.        Behavior: Behavior normal.     Vital Signs: BP 130/82 (BP Location: Left Arm)   Pulse 85   Temp 98 F (36.7 C) (Oral)   Resp 20   Ht 5\' 5"  (1.651 m)   Wt 48.8 kg   SpO2 96%   BMI 17.91 kg/m  Pain Scale: 0-10   Pain Score: 5    SpO2: SpO2: 96 % O2 Device:SpO2: 96 % O2 Flow Rate: .O2 Flow Rate (L/min): 2 L/min  IO: Intake/output summary:  Intake/Output Summary (Last 24 hours) at 02/01/2024 4098 Last data filed at 02/01/2024 0600 Gross per 24 hour  Intake 2088.57 ml  Output 2200 ml  Net -111.43 ml    LBM: Last BM Date : 01/30/24 Baseline Weight: Weight: 48.8 kg Most recent weight: Weight: 48.8 kg     Palliative Assessment/Data:     Time In: 0830    Time Out: 0925 Time Total: 55 minutes  Greater than 50%  of this time was spent counseling and coordinating care related to the above assessment and plan.  Signed by: Annabelle Barrack, NP   Please contact Palliative Medicine Team phone at [phone] for questions and concerns.  For individual provider: See Tilford Foley

## 2024-02-02 ENCOUNTER — Inpatient Hospital Stay (HOSPITAL_COMMUNITY)

## 2024-02-02 DIAGNOSIS — I639 Cerebral infarction, unspecified: Secondary | ICD-10-CM | POA: Diagnosis not present

## 2024-02-02 DIAGNOSIS — L8994 Pressure ulcer of unspecified site, stage 4: Secondary | ICD-10-CM | POA: Diagnosis not present

## 2024-02-02 DIAGNOSIS — E785 Hyperlipidemia, unspecified: Secondary | ICD-10-CM | POA: Diagnosis not present

## 2024-02-02 DIAGNOSIS — Z515 Encounter for palliative care: Secondary | ICD-10-CM | POA: Diagnosis not present

## 2024-02-02 DIAGNOSIS — N261 Atrophy of kidney (terminal): Secondary | ICD-10-CM | POA: Diagnosis not present

## 2024-02-02 DIAGNOSIS — Z7189 Other specified counseling: Secondary | ICD-10-CM

## 2024-02-02 DIAGNOSIS — E8770 Fluid overload, unspecified: Secondary | ICD-10-CM | POA: Diagnosis not present

## 2024-02-02 DIAGNOSIS — K219 Gastro-esophageal reflux disease without esophagitis: Secondary | ICD-10-CM | POA: Diagnosis not present

## 2024-02-02 DIAGNOSIS — N319 Neuromuscular dysfunction of bladder, unspecified: Secondary | ICD-10-CM | POA: Diagnosis not present

## 2024-02-02 DIAGNOSIS — Z681 Body mass index (BMI) 19 or less, adult: Secondary | ICD-10-CM | POA: Diagnosis not present

## 2024-02-02 DIAGNOSIS — R54 Age-related physical debility: Secondary | ICD-10-CM | POA: Diagnosis not present

## 2024-02-02 DIAGNOSIS — R000 Tachycardia, unspecified: Secondary | ICD-10-CM | POA: Diagnosis not present

## 2024-02-02 DIAGNOSIS — Z79899 Other long term (current) drug therapy: Secondary | ICD-10-CM | POA: Diagnosis not present

## 2024-02-02 DIAGNOSIS — J440 Chronic obstructive pulmonary disease with acute lower respiratory infection: Secondary | ICD-10-CM | POA: Diagnosis not present

## 2024-02-02 DIAGNOSIS — I700 Atherosclerosis of aorta: Secondary | ICD-10-CM | POA: Diagnosis not present

## 2024-02-02 DIAGNOSIS — J189 Pneumonia, unspecified organism: Secondary | ICD-10-CM | POA: Diagnosis not present

## 2024-02-02 DIAGNOSIS — I2510 Atherosclerotic heart disease of native coronary artery without angina pectoris: Secondary | ICD-10-CM | POA: Diagnosis not present

## 2024-02-02 DIAGNOSIS — Z792 Long term (current) use of antibiotics: Secondary | ICD-10-CM | POA: Diagnosis not present

## 2024-02-02 DIAGNOSIS — Z7982 Long term (current) use of aspirin: Secondary | ICD-10-CM | POA: Diagnosis not present

## 2024-02-02 DIAGNOSIS — Z7401 Bed confinement status: Secondary | ICD-10-CM | POA: Diagnosis not present

## 2024-02-02 DIAGNOSIS — L89323 Pressure ulcer of left buttock, stage 3: Secondary | ICD-10-CM | POA: Diagnosis not present

## 2024-02-02 DIAGNOSIS — L89154 Pressure ulcer of sacral region, stage 4: Secondary | ICD-10-CM | POA: Diagnosis not present

## 2024-02-02 DIAGNOSIS — R52 Pain, unspecified: Secondary | ICD-10-CM | POA: Diagnosis not present

## 2024-02-02 DIAGNOSIS — L89620 Pressure ulcer of left heel, unstageable: Secondary | ICD-10-CM | POA: Diagnosis not present

## 2024-02-02 DIAGNOSIS — L89510 Pressure ulcer of right ankle, unstageable: Secondary | ICD-10-CM | POA: Diagnosis not present

## 2024-02-02 DIAGNOSIS — E872 Acidosis, unspecified: Secondary | ICD-10-CM | POA: Diagnosis not present

## 2024-02-02 DIAGNOSIS — R531 Weakness: Secondary | ICD-10-CM | POA: Diagnosis not present

## 2024-02-02 DIAGNOSIS — K5900 Constipation, unspecified: Secondary | ICD-10-CM | POA: Diagnosis not present

## 2024-02-02 DIAGNOSIS — R1310 Dysphagia, unspecified: Secondary | ICD-10-CM | POA: Diagnosis not present

## 2024-02-02 DIAGNOSIS — J9611 Chronic respiratory failure with hypoxia: Secondary | ICD-10-CM | POA: Diagnosis not present

## 2024-02-02 DIAGNOSIS — R278 Other lack of coordination: Secondary | ICD-10-CM | POA: Diagnosis not present

## 2024-02-02 DIAGNOSIS — J449 Chronic obstructive pulmonary disease, unspecified: Secondary | ICD-10-CM | POA: Diagnosis not present

## 2024-02-02 DIAGNOSIS — N312 Flaccid neuropathic bladder, not elsewhere classified: Secondary | ICD-10-CM | POA: Diagnosis not present

## 2024-02-02 DIAGNOSIS — R159 Full incontinence of feces: Secondary | ICD-10-CM | POA: Diagnosis not present

## 2024-02-02 DIAGNOSIS — Z79891 Long term (current) use of opiate analgesic: Secondary | ICD-10-CM | POA: Diagnosis not present

## 2024-02-02 DIAGNOSIS — N390 Urinary tract infection, site not specified: Secondary | ICD-10-CM | POA: Diagnosis not present

## 2024-02-02 DIAGNOSIS — R110 Nausea: Secondary | ICD-10-CM | POA: Diagnosis not present

## 2024-02-02 DIAGNOSIS — M4628 Osteomyelitis of vertebra, sacral and sacrococcygeal region: Secondary | ICD-10-CM | POA: Diagnosis not present

## 2024-02-02 DIAGNOSIS — G894 Chronic pain syndrome: Secondary | ICD-10-CM | POA: Diagnosis not present

## 2024-02-02 DIAGNOSIS — G35 Multiple sclerosis: Secondary | ICD-10-CM

## 2024-02-02 DIAGNOSIS — N184 Chronic kidney disease, stage 4 (severe): Secondary | ICD-10-CM | POA: Diagnosis not present

## 2024-02-02 DIAGNOSIS — Z79890 Hormone replacement therapy (postmenopausal): Secondary | ICD-10-CM | POA: Diagnosis not present

## 2024-02-02 DIAGNOSIS — R0902 Hypoxemia: Secondary | ICD-10-CM | POA: Diagnosis not present

## 2024-02-02 DIAGNOSIS — I10 Essential (primary) hypertension: Secondary | ICD-10-CM | POA: Diagnosis not present

## 2024-02-02 DIAGNOSIS — F419 Anxiety disorder, unspecified: Secondary | ICD-10-CM | POA: Diagnosis not present

## 2024-02-02 DIAGNOSIS — L89159 Pressure ulcer of sacral region, unspecified stage: Secondary | ICD-10-CM | POA: Diagnosis not present

## 2024-02-02 DIAGNOSIS — I959 Hypotension, unspecified: Secondary | ICD-10-CM | POA: Diagnosis not present

## 2024-02-02 DIAGNOSIS — L89320 Pressure ulcer of left buttock, unstageable: Secondary | ICD-10-CM | POA: Diagnosis not present

## 2024-02-02 DIAGNOSIS — I130 Hypertensive heart and chronic kidney disease with heart failure and stage 1 through stage 4 chronic kidney disease, or unspecified chronic kidney disease: Secondary | ICD-10-CM | POA: Diagnosis not present

## 2024-02-02 DIAGNOSIS — J15212 Pneumonia due to Methicillin resistant Staphylococcus aureus: Secondary | ICD-10-CM | POA: Diagnosis not present

## 2024-02-02 DIAGNOSIS — M6281 Muscle weakness (generalized): Secondary | ICD-10-CM | POA: Diagnosis not present

## 2024-02-02 DIAGNOSIS — E440 Moderate protein-calorie malnutrition: Secondary | ICD-10-CM | POA: Diagnosis not present

## 2024-02-02 DIAGNOSIS — Z66 Do not resuscitate: Secondary | ICD-10-CM | POA: Diagnosis not present

## 2024-02-02 DIAGNOSIS — R918 Other nonspecific abnormal finding of lung field: Secondary | ICD-10-CM | POA: Diagnosis not present

## 2024-02-02 DIAGNOSIS — E876 Hypokalemia: Secondary | ICD-10-CM | POA: Diagnosis not present

## 2024-02-02 DIAGNOSIS — N179 Acute kidney failure, unspecified: Secondary | ICD-10-CM | POA: Diagnosis not present

## 2024-02-02 DIAGNOSIS — N99521 Infection of other external stoma of urinary tract: Secondary | ICD-10-CM | POA: Diagnosis not present

## 2024-02-02 DIAGNOSIS — E039 Hypothyroidism, unspecified: Secondary | ICD-10-CM | POA: Diagnosis not present

## 2024-02-02 DIAGNOSIS — Z87891 Personal history of nicotine dependence: Secondary | ICD-10-CM | POA: Diagnosis not present

## 2024-02-02 DIAGNOSIS — Z5941 Food insecurity: Secondary | ICD-10-CM | POA: Diagnosis not present

## 2024-02-02 DIAGNOSIS — J439 Emphysema, unspecified: Secondary | ICD-10-CM | POA: Diagnosis not present

## 2024-02-02 DIAGNOSIS — J8410 Pulmonary fibrosis, unspecified: Secondary | ICD-10-CM | POA: Diagnosis not present

## 2024-02-02 DIAGNOSIS — R1312 Dysphagia, oropharyngeal phase: Secondary | ICD-10-CM | POA: Diagnosis not present

## 2024-02-02 DIAGNOSIS — T83511A Infection and inflammatory reaction due to indwelling urethral catheter, initial encounter: Secondary | ICD-10-CM | POA: Diagnosis not present

## 2024-02-02 DIAGNOSIS — L89626 Pressure-induced deep tissue damage of left heel: Secondary | ICD-10-CM | POA: Diagnosis not present

## 2024-02-02 DIAGNOSIS — Z9359 Other cystostomy status: Secondary | ICD-10-CM | POA: Diagnosis not present

## 2024-02-02 DIAGNOSIS — L89310 Pressure ulcer of right buttock, unstageable: Secondary | ICD-10-CM | POA: Diagnosis not present

## 2024-02-02 DIAGNOSIS — I679 Cerebrovascular disease, unspecified: Secondary | ICD-10-CM | POA: Diagnosis not present

## 2024-02-02 DIAGNOSIS — R0602 Shortness of breath: Secondary | ICD-10-CM | POA: Diagnosis not present

## 2024-02-02 DIAGNOSIS — N1832 Chronic kidney disease, stage 3b: Secondary | ICD-10-CM | POA: Diagnosis not present

## 2024-02-02 DIAGNOSIS — I5023 Acute on chronic systolic (congestive) heart failure: Secondary | ICD-10-CM | POA: Diagnosis not present

## 2024-02-02 DIAGNOSIS — J9601 Acute respiratory failure with hypoxia: Secondary | ICD-10-CM | POA: Diagnosis not present

## 2024-02-02 LAB — BASIC METABOLIC PANEL WITH GFR
Anion gap: 10 (ref 5–15)
BUN: 54 mg/dL — ABNORMAL HIGH (ref 8–23)
CO2: 16 mmol/L — ABNORMAL LOW (ref 22–32)
Calcium: 8.2 mg/dL — ABNORMAL LOW (ref 8.9–10.3)
Chloride: 120 mmol/L — ABNORMAL HIGH (ref 98–111)
Creatinine, Ser: 2.2 mg/dL — ABNORMAL HIGH (ref 0.61–1.24)
GFR, Estimated: 30 mL/min — ABNORMAL LOW (ref >60)
Glucose, Bld: 154 mg/dL — ABNORMAL HIGH (ref 70–99)
Potassium: 3.5 mmol/L (ref 3.5–5.1)
Sodium: 146 mmol/L — ABNORMAL HIGH (ref 135–145)

## 2024-02-02 LAB — CULTURE, BLOOD (ROUTINE X 2): Special Requests: ADEQUATE

## 2024-02-02 LAB — CBC
HCT: 29.2 % — ABNORMAL LOW (ref 39.0–52.0)
Hemoglobin: 8.7 g/dL — ABNORMAL LOW (ref 13.0–17.0)
MCH: 29.4 pg (ref 26.0–34.0)
MCHC: 29.8 g/dL — ABNORMAL LOW (ref 30.0–36.0)
MCV: 98.6 fL (ref 80.0–100.0)
Platelets: 278 10*3/uL (ref 150–400)
RBC: 2.96 MIL/uL — ABNORMAL LOW (ref 4.22–5.81)
RDW: 16.3 % — ABNORMAL HIGH (ref 11.5–15.5)
WBC: 9 10*3/uL (ref 4.0–10.5)
nRBC: 0 % (ref 0.0–0.2)

## 2024-02-02 MED ORDER — AMLODIPINE BESYLATE 5 MG PO TABS: 5.0000 mg | ORAL_TABLET | Freq: Every day | ORAL | 5 refills | Status: DC

## 2024-02-02 MED ORDER — LEVOFLOXACIN 500 MG PO TABS: 500.0000 mg | ORAL_TABLET | ORAL | 0 refills | Status: DC

## 2024-02-02 MED ORDER — SENNOSIDES-DOCUSATE SODIUM 8.6-50 MG PO TABS: 2.0000 | ORAL_TABLET | Freq: Every day | ORAL | 5 refills | Status: DC

## 2024-02-02 MED ORDER — PREDNISONE 20 MG PO TABS: 40.0000 mg | ORAL_TABLET | Freq: Every day | ORAL | 0 refills | Status: AC

## 2024-02-02 MED ORDER — ASPIRIN 81 MG PO CHEW: 81.0000 mg | CHEWABLE_TABLET | Freq: Every day | ORAL | 11 refills | Status: DC

## 2024-02-02 MED ORDER — DIAZEPAM 2 MG PO TABS: 2.0000 mg | ORAL_TABLET | Freq: Two times a day (BID) | ORAL | 0 refills | Status: DC

## 2024-02-02 MED ORDER — LEVOFLOXACIN 500 MG PO TABS
500.0000 mg | ORAL_TABLET | Freq: Once | ORAL | Status: AC
Start: 2024-02-02 — End: 2024-02-02
  Administered 2024-02-02: 500 mg via ORAL
  Filled 2024-02-02: qty 1

## 2024-02-02 MED ORDER — HYDROCODONE-ACETAMINOPHEN 5-325 MG PO TABS: 1.0000 | ORAL_TABLET | Freq: Three times a day (TID) | ORAL | 0 refills | Status: DC | PRN

## 2024-02-02 MED ORDER — MIRTAZAPINE 15 MG PO TABS: 7.5000 mg | ORAL_TABLET | Freq: Every day | ORAL | 1 refills | Status: DC

## 2024-02-02 NOTE — Plan of Care (Signed)
  Problem: Education: Goal: Knowledge of General Education information will improve Description: Including pain rating scale, medication(s)/side effects and non-pharmacologic comfort measures 02/02/2024 0513 by Irean Manner, RN Outcome: Progressing 02/02/2024 0430 by Irean Manner, RN Outcome: Progressing   Problem: Health Behavior/Discharge Planning: Goal: Ability to manage health-related needs will improve 02/02/2024 0513 by Irean Manner, RN Outcome: Progressing 02/02/2024 0430 by Irean Manner, RN Outcome: Progressing   Problem: Clinical Measurements: Goal: Ability to maintain clinical measurements within normal limits will improve 02/02/2024 0513 by Irean Manner, RN Outcome: Progressing 02/02/2024 0430 by Irean Manner, RN Outcome: Progressing Goal: Will remain free from infection 02/02/2024 0513 by Irean Manner, RN Outcome: Progressing 02/02/2024 0430 by Irean Manner, RN Outcome: Progressing Goal: Diagnostic test results will improve 02/02/2024 0513 by Irean Manner, RN Outcome: Progressing 02/02/2024 0430 by Irean Manner, RN Outcome: Progressing Goal: Respiratory complications will improve 02/02/2024 0513 by Irean Manner, RN Outcome: Progressing 02/02/2024 0430 by Irean Manner, RN Outcome: Progressing Goal: Cardiovascular complication will be avoided 02/02/2024 0513 by Irean Manner, RN Outcome: Progressing 02/02/2024 0430 by Irean Manner, RN Outcome: Progressing

## 2024-02-02 NOTE — Discharge Summary (Signed)
 Jordan Ward, is a 83 y.o. male  DOB 1941-07-04  MRN [id number].  Admission date:  01/30/2024  Admitting Physician  Colin Dawley, MD  Discharge Date:  02/02/2024   Primary MD  Omie Bickers, MD  Recommendations for primary care physician for things to follow:  1)Take Levaquin --500 mg Every other day (Q 48 hrs) -starting 02/03/24 (02/03/24, 05/09, 05/11, 05/13 and 02/11/24) 2)Avoid ibuprofen /Advil /Aleve/Motrin Jordan Ward/Naproxen/BC Ward/Meloxicam/Diclofenac/Indomethacin and other Nonsteroidal anti-inflammatory medications as these will make you more likely to bleed and can cause stomach ulcers, can also cause Kidney problems.  3)Per urologist Dr. Claretta Croft suprapubic catheter was last changed on 01/12/2024 he recommends changing suprapubic catheter?  Next change should be around 02/11/2024 -He also recommends outpatient follow-up with IR in 1 to 2 weeks for possible change of left flank nephrostomy tube 4)you need oxygen  at home at 2 L via nasal cannula continuously while awake and while asleep--- smoking or having open fires around oxygen  can cause fire, significant injury and death  Admission Diagnosis  AKI (acute kidney injury) (HCC) [N17.9] HCAP (healthcare-associated pneumonia) [J18.9] Severe sepsis (HCC) [A41.9, R65.20]   Discharge Diagnosis  AKI (acute kidney injury) (HCC) [N17.9] HCAP (healthcare-associated pneumonia) [J18.9] Severe sepsis (HCC) [A41.9, R65.20]    Principal Problem:   AKI (acute kidney injury) (HCC) Active Problems:   UTI (urinary tract infection)   Ventral hernia without obstruction or gangrene   Nephrostomy present (HCC)   Multiple sclerosis (HCC)   Hypothyroidism   Anxiety      Past Medical History:  Diagnosis Date   Anemia    Arthritis    Back pain, chronic    Bilateral carotid bruits    C. difficile colitis 09/2011   CAD (coronary artery disease)    Carotid  artery stenosis    Cerebrovascular disease    Cerebrovascular disease 08/17/2018   Colon polyps    Complication of cystostomy catheter, initial encounter (HCC) 04/20/2020   Dyslipidemia    Dysphagia 10/07/2011   Encephalopathy    Gait disorder    HA (headache)    High grade dysplasia in colonic adenoma 09/2005   History of kidney stones    HTN (hypertension), malignant 10/06/2011   Hypernatremia    Hypokalemia    Hypothyroidism 10/08/2011   Insomnia    Junctional rhythm    Kidney stones    MS (multiple sclerosis) (HCC)    Nausea and vomiting 03/02/2020   Neuromuscular disorder (HCC)    MS   OSA (obstructive sleep apnea)    Paroxysmal atrial tachycardia (HCC)    Peripheral vascular disease (HCC)    Pneumonia 4 yrs ago   Pulmonary fibrosis (HCC) 10/06/2011   Pulmonary nodule 10/08/2011   PVD (peripheral vascular disease) (HCC)    Sacral ulcer (HCC)    Sleep apnea    cannot tolerate   Stroke (HCC)    left sided weakness   Suprapubic catheter (HCC)    TIA (transient ischemic attack)    Tremors of nervous system 10/08/2011   Urinary tract infection  Ventral hernia without obstruction or gangrene     Large 8X9cm ventral hernia with loss of domain. CT reads report as diastasis recti with herniation or diastasis recti.  Dr. Collene Dawson, Surgery, reviewed CT with radiology and there is herniation with only hernia sac or peritoneum over the bowel and large separation of the rectus muslce (i.e. diastasis recti aka loss of domain).  No surgical intervention recommended given size, age, and health.     Past Surgical History:  Procedure Laterality Date   APPENDECTOMY  09/2005   at time of left hemicolectomy   BACK SURGERY  1976/1979   lower   BILIARY DILATION N/A 03/03/2020   Procedure: BILIARY DILATION;  Surgeon: Ruby Corporal, MD;  [facility];  Service: Endoscopy;  Laterality: N/A;   CATARACT EXTRACTION W/PHACO Right 03/08/2018   Procedure: CATARACT EXTRACTION PHACO  AND INTRAOCULAR LENS PLACEMENT RIGHT EYE;  Surgeon: Anner Kill, MD;  Location: [facility];  Service: Ophthalmology;  Laterality: Right;  CDE: 8.86   CATARACT EXTRACTION W/PHACO Left 04/05/2018   Procedure: CATARACT EXTRACTION PHACO AND INTRAOCULAR LENS PLACEMENT (IOC);  Surgeon: Anner Kill, MD;  Location: [facility];  Service: Ophthalmology;  Laterality: Left;  CDE: 7.36   CENTRAL LINE INSERTION-TUNNELED Right 09/11/2020   Procedure: PLACEMENT OF TUNNELED CENTRAL LINE INTO JUGULAR VEIN;  Surgeon: Awilda Bogus, MD;  Location: [facility];  Service: General;  Laterality: Right;   CHOLECYSTECTOMY     Dr. Felipe Horton   COLON SURGERY  09/2005   Fleishman: four tubular adenomas, large adenomatous polyp with HIGH GRADE dysplasia   COLONOSCOPY  11/2004   Dr. Ileana Mallard sessile polyp splenic flexure, 10mm sessile polyp desc colon, tubulovillous adenoma (bx not removed)   COLONOSCOPY  01/2005   poor prep, polyp could not be found   COLONOSCOPY  05/2005   with EMR, polypectomy Dr. Marylin So, bx showed high grade dysplasia, partially resected   COLONOSCOPY  09/2005   Dr. Hercules Lombard, [place] ink tattooing, four villous colon polyp (3 had been missed on previous colonoscopies due to limitations of procedures   COLONOSCOPY  09/2006   normal TI, no polyps   COLONOSCOPY  10/2007   Dr. Merrill Abide distal mammillations, benign bx, normal TI, random bx neg for microscopic colitis   CYSTOSCOPY W/ URETERAL STENT PLACEMENT Left 09/05/2020   Procedure: CYSTOSCOPY WITH RETROGRADE PYELOGRAM/URETERAL STENT PLACEMENT;  Surgeon: Andrez Banker, MD;  Location: [facility];  Service: Urology;  Laterality: Left;   CYSTOSCOPY WITH LITHOLAPAXY N/A 07/27/2018   Procedure: CYSTOSCOPY WITH LITHOLAPAXY VIA  SUPRAPUBIC TUBE;  Surgeon: Trent Frizzle, MD;  Location: [facility];  Service: Urology;  Laterality: N/A;   CYSTOSCOPY WITH RETROGRADE PYELOGRAM, URETEROSCOPY AND STENT PLACEMENT Left 06/09/2017   Procedure: CYSTOSCOPY WITH LEFT RETROGRADE  PYELOGRAM, LEFT URETEROSCOPY, LEFT URETEROSCOPIC STONE EXTRACTION, LEFT URETERAL STENT PLACEMENT;  Surgeon: Trent Frizzle, MD;  Location: [facility];  Service: Urology;  Laterality: Left;   CYSTOSCOPY/URETEROSCOPY/HOLMIUM LASER/STENT PLACEMENT Left 05/22/2020   Procedure: CYSTOSCOPY/URETEROSCOPY/STENT PLACEMENT;  Surgeon: Trent Frizzle, MD;  [facility]: [facility];  Service: Urology;  Laterality: Left;   ERCP N/A 03/03/2020   Procedure: ENDOSCOPIC RETROGRADE CHOLANGIOPANCREATOGRAPHY (ERCP);  Surgeon: Ruby Corporal, MD;  [facility];  Service: Endoscopy;  Laterality: N/A;   ERCP N/A 04/20/2020   Procedure: ENDOSCOPIC RETROGRADE CHOLANGIOPANCREATOGRAPHY (ERCP);  Surgeon: Ruby Corporal, MD;  [facility];  Service: Endoscopy;  Laterality: N/A;  to be done at 7:30am in OR   GASTROINTESTINAL STENT REMOVAL N/A 04/20/2020  Procedure: STENT REMOVAL;  Surgeon: Ruby Corporal, MD;  [facility];  Service: Endoscopy;  Laterality: N/A;   INGUINAL HERNIA REPAIR  1971   bilateral   INSERTION OF SUPRAPUBIC CATHETER  06/09/2017   Procedure: EXCHANGE OF SUPRAPUBIC CATHETER;  Surgeon: Trent Frizzle, MD;  [facility]: [facility];  Service: Urology;;   INSERTION OF SUPRAPUBIC CATHETER  05/22/2020   Procedure: SUPRAPUBIC CATHETER EXCHANGE;  Surgeon: Trent Frizzle, MD;  [facility];  Service: Urology;;   IR NEPHROSTOMY EXCHANGE LEFT  03/25/2021   IR NEPHROSTOMY EXCHANGE LEFT  05/20/2021   IR NEPHROSTOMY EXCHANGE LEFT  07/13/2021   IR NEPHROSTOMY EXCHANGE LEFT  09/09/2021   IR NEPHROSTOMY EXCHANGE LEFT  11/04/2021   IR NEPHROSTOMY EXCHANGE LEFT  12/23/2021   IR NEPHROSTOMY EXCHANGE LEFT  02/17/2022   IR NEPHROSTOMY EXCHANGE LEFT  04/14/2022   IR NEPHROSTOMY EXCHANGE LEFT  06/09/2022   IR NEPHROSTOMY EXCHANGE LEFT  07/28/2022   IR NEPHROSTOMY EXCHANGE LEFT  09/09/2022   IR NEPHROSTOMY EXCHANGE LEFT  11/04/2022   IR NEPHROSTOMY EXCHANGE LEFT  12/30/2022   IR NEPHROSTOMY EXCHANGE  LEFT  02/25/2023   IR NEPHROSTOMY EXCHANGE LEFT  03/31/2023   IR NEPHROSTOMY EXCHANGE LEFT  05/26/2023   IR NEPHROSTOMY EXCHANGE LEFT  07/21/2023   IR NEPHROSTOMY EXCHANGE LEFT  09/15/2023   IR NEPHROSTOMY EXCHANGE LEFT  12/22/2023   IR NEPHROSTOMY PLACEMENT LEFT  05/26/2017   IR NEPHROSTOMY PLACEMENT LEFT  05/02/2020   IR NEPHROSTOMY PLACEMENT LEFT  02/15/2021   IR NEPHROSTOMY TUBE CHANGE  10/29/2023   IR URETERAL STENT PERC REMOVAL MOD SED  03/25/2021   KIDNEY STONE SURGERY  09/13/2015   LITHOTRIPSY N/A 03/03/2020   Procedure: MECHANICAL LITHOTRIPSY WITH REMOVAL OF MULTIPLE STONE FRAGMENTS;  Surgeon: Ruby Corporal, MD;  [facility];  Service: Endoscopy;  Laterality: N/A;   NEPHROLITHOTOMY Left 09/13/2015   Procedure: LEFT PERCUTANEOUS NEPHROLITHOTOMY ;  Surgeon: Trent Frizzle, MD;  [facility];  Service: Urology;  Laterality: Left;   NEPHROSTOMY TUBE REMOVAL  05/22/2020   Procedure: NEPHROSTOMY TUBE REMOVAL;  Surgeon: Trent Frizzle, MD;  Location: [facility];  Service: Urology;;   [facility] REMOVAL Right 09/20/2020   Procedure: MINOR REMOVAL CENTRAL LINE;  Surgeon: Awilda Bogus, MD;  [facility]: [facility];  Service: General;  Laterality: Right;  Pt to arrive at 7:30am for procedure   REMOVAL OF STONES N/A 04/20/2020   Procedure: REMOVAL OF STONES;  Surgeon: Ruby Corporal, MD;  [facility];  Service: Endoscopy;  Laterality: N/A;   SPHINCTEROTOMY  03/03/2020   Procedure: BILLARY SPHINCTEROTOMY;  Surgeon: Ruby Corporal, MD;  [facility];  Service: Endoscopy;;   SUPRAPUBIC CATHETER INSERTION       HPI  from the history and physical done on the day of admission:     HPI: Jordan Ward is a 83 y.o. male with medical history significant of pulmonary fibrosis/COPD, CAD, prior stroke, multiple sclerosis with neurogenic bladder, has had a longstanding suprapubic tube  with atrophic right  kidney and a left percutaneous nephrostomy tube, hypothyroidism,  who presents to the ED from [facility] with concerns for hypoxia. - In ED patient required up to 11 L of oxygen  via nasal cannula - Currently being weaned down - Additional history obtained from patient's wife at bedside - Reports fatigue, generalized weakness, productive cough with yellow to green sputum and worsening dyspnea No fever  Or chills  No Nausea, Vomiting or  Diarrhea In ED--CXR--Chronic interstitial opacities with some improvement in the right basilar airspace component. --WBC 19.1 Hgb 10.2 which is close to baseline - MRSA PCR positive - UA suggestive of UTI - Creatinine up to 3.46 from a baseline usually between 1.7 and 2.0 - LFTs are not elevated   Review of Systems: As mentioned in the history of present illness. All other systems reviewed and are negative.    Hospital Course:   Brief Narrative:  83 y.o. male with medical history significant of pulmonary fibrosis/COPD, CAD, prior stroke, multiple sclerosis with neurogenic bladder, has had a longstanding suprapubic tube  with atrophic right  kidney and a left percutaneous nephrostomy tube, hypothyroidism, who was admitted on 02/16/2024 from [facility] with E. coli UTI and bacteremia and acute hypoxic respiratory failure.    -Assessment and Plan: 1)AKI----acute kidney injury on CKD stage -3B    creatinine on admission=3.46  ,  -baseline creatinine =  baseline usually between 1.7 and 2.0  ,  -Creatinine improving with hydration -Creatinine is down to 2.20 from a peak of 3.46 this admission --renally adjust medications, avoid nephrotoxic agents / dehydration  / hypotension   2) E. coli bacteremia secondary to E. coli CAUTI--- patient with presumed complicated UTI -Patient has neurogenic bladder due to underlying multiple sclerosis, he has suprapubic catheter AND left-sided nephrostomy tube in situ -Blood cultures from 01/30/2024 with E. coli in all 4 bottles -stopped vancomycin  -E. coli appears Not to be ESBL  so Stopped Meropenem  -Started Rocephin  on 01/31/24----  -Per urologist Dr. Claretta Croft suprapubic catheter was last changed on 01/12/2024 he recommends no need to change at this time,  -He recommends outpatient follow-up with IR in 1 to 2 weeks for possible change of left flank nephrostomy tube -Okay to discharge on Levaquin  500 mg every 48 hours per sensitivity report   3) acute hypoxic respiratory failure--patient with underlying COPD/pulmonary fibrosis - Required up to 11 L of oxygen  via nasal cannula in the ED on admission - Currently being weaned down to 1 to 2 L/min - Antibiotics as above in #2 -Treated with bronchodilators and steroids - chest x-ray on 02/02/2024 suggestive of possible multifocal pneumonia--antibiotics as above #2 - Mucolytics and bronchodilators as ordered   4) COPD/pulmonary fibrosis flareup--- concerns for multifocal pneumonia  -- chest x-ray on 02/02/2024 suggestive of possible multifocal pneumonia--antibiotics as above #2 -management as above #3   5) GERD--- continue Protonix  especially with high-dose steroid therapy   6)FTT/Anorexia with moderate protein caloric malnutrition ---BMI 17 -Nutritional supplements encouraged -- PTA patient was on Marinol for appetite stimulation, per wife Marinol appears to be causing some side effects like confusion/Disorientation -Stopped Marinol, started Remeron nightly for appetite,   7) hypothyroidism--- continue levothyroxine    8) multiple sclerosis--- continue muscle relaxants loading diazepam  and methocarbamol, continue Myrbetriq /oxybutynin  for neurogenic bladder and laxatives for neurogenic bowel   Disposition: The patient is from: SNF              Anticipated d/c is to: SNF  Discharge Condition: Stable  Follow UP   Follow-up Information     Marco Severs, MD. Schedule an appointment as soon as possible for a visit in 1 week(s).   Specialty: Urology Why: Suprapubic catheter and left nephrostomy tube Contact  information: [address] [place] [address] [phone]                 Consults obtained -discussed with urologist Dr. Claretta Croft  Diet and Activity recommendation:  As advised  Discharge Instructions    Discharge Instructions     Call MD for:  difficulty breathing, headache or visual disturbances   Complete by: As directed    Call MD for:  persistant dizziness or light-headedness   Complete by: As directed    Call MD for:  persistant nausea and vomiting   Complete by: As directed    Call MD for:  temperature >100.4   Complete by: As directed    Diet general   Complete by: As directed    Discharge instructions   Complete by: As directed    1)Take Levaquin --500 mg Every other day (Q 48 hrs) -starting 02/03/24 (02/03/24, 05/09, 05/11, 05/13 and 02/11/24) 2)Avoid ibuprofen /Advil /Aleve/Motrin Jordan Ward/Naproxen/BC Ward/Meloxicam/Diclofenac/Indomethacin and other Nonsteroidal anti-inflammatory medications as these will make you more likely to bleed and can cause stomach ulcers, can also cause Kidney problems.  3)Per urologist Dr. Claretta Croft suprapubic catheter was last changed on 01/12/2024 he recommends changing suprapubic catheter?  Next change should be around 02/11/2024 -He also recommends outpatient follow-up with IR in 1 to 2 weeks for possible change of left flank nephrostomy tube 4)you need oxygen  at home at 2 L via nasal cannula continuously while awake and while asleep--- smoking or having open fires around oxygen  can cause fire, significant injury and death   Increase activity slowly   Complete by: As directed    No wound care   Complete by: As directed        Discharge Medications     Allergies as of 02/02/2024       Reactions   Ciprofloxacin  Other (See Comments)   Trouble swallowing according to wife "[name] out in the floor" Pt can tolerate Levaquin    Tetracyclines & Related Anaphylaxis, Rash        Medication List     STOP  taking these medications    dronabinol 2.5 MG capsule Commonly known as: MARINOL   potassium chloride  SA 20 MEQ tablet Commonly known as: KLOR-CON  M   senna 8.6 MG tablet Commonly known as: SENOKOT   zinc  gluconate 50 MG tablet   zinc  sulfate (50mg  elemental zinc ) 220 (50 Zn) MG capsule       TAKE these medications    acetaminophen  325 MG tablet Commonly known as: TYLENOL  Take 650 mg by mouth every 4 (four) hours as needed for mild pain (pain score 1-3), moderate pain (pain score 4-6) or fever.   amLODipine  5 MG tablet Commonly known as: NORVASC  Take 1 tablet (5 mg total) by mouth daily. Start taking on: Feb 03, 2024 What changed:  medication strength when to take this   ascorbic acid  500 MG tablet Commonly known as: VITAMIN C Take 500 mg by mouth every morning.   aspirin  81 MG chewable tablet Chew 1 tablet (81 mg total) by mouth daily with breakfast. What changed: when to take this   busPIRone 5 MG tablet Commonly known as: BUSPAR Take 5 mg by mouth 3 (three) times daily.   carboxymethylcellulose 1 % ophthalmic solution Place 2 drops into both eyes every 4 (four) hours as needed (dry eyes).   diazepam  2 MG tablet Commonly known as: VALIUM  Take 1 tablet (2 mg total) by mouth 2 (two) times daily. What changed:  medication strength how much to take   HYDROcodone -acetaminophen  5-325 MG tablet Commonly known as: NORCO/VICODIN Take 1 tablet by mouth every 8 (eight) hours as needed for moderate pain (pain score 4-6). What changed:  when to take this reasons to take  this Another medication with the same name was removed. Continue taking this medication, and follow the directions you see here.   levofloxacin  500 MG tablet Commonly known as: LEVAQUIN  Take 1 tablet (500 mg total) by mouth See admin instructions. Every other day (Q 48 hrs) -starting 02/03/24 (02/03/24, 05/09, 05/11, 05/13 and 02/11/24)   levothyroxine  75 MCG tablet Commonly known as:  SYNTHROID  Take 75 mcg by mouth in the morning.   melatonin 3 MG Tabs tablet Take 3 mg by mouth daily as needed (sleep).   methocarbamol 500 MG tablet Commonly known as: ROBAXIN Take 500 mg by mouth every 6 (six) hours as needed for muscle spasms.   mirabegron  ER 50 MG Tb24 tablet Commonly known as: MYRBETRIQ  Take 1 tablet (50 mg total) by mouth daily.   mirtazapine 15 MG tablet Commonly known as: Remeron Take 0.5 tablets (7.5 mg total) by mouth at bedtime.   multivitamin tablet Take 1 tablet by mouth every morning.   oxybutynin  5 MG 24 hr tablet Commonly known as: DITROPAN -XL Take 5 mg by mouth every morning.   OXYGEN  Inhale 1 L into the lungs every evening.   pantoprazole  40 MG tablet Commonly known as: PROTONIX  Take 1 tablet (40 mg total) by mouth daily.   polyethylene glycol 17 g packet Commonly known as: MIRALAX  / GLYCOLAX  Take 17 g by mouth every morning.   Potassium Bicarb-Citric Acid 20 MEQ Tbef Take 20 mEq by mouth every morning.   predniSONE  20 MG tablet Commonly known as: DELTASONE  Take 2 tablets (40 mg total) by mouth daily with breakfast for 5 days.   PRO-STAT PROFILE PO Take 30 mLs by mouth 2 (two) times daily.   senna-docusate 8.6-50 MG tablet Commonly known as: Senokot-S Take 2 tablets by mouth at bedtime.   sodium chloride  0.65 % Soln nasal spray Commonly known as: OCEAN Place 1 spray into both nostrils in the morning, at noon, in the evening, and at bedtime.   thiamine  100 MG tablet Commonly known as: Vitamin B-1 Take 1 tablet (100 mg total) by mouth daily.       Major procedures and Radiology Reports - PLEASE review detailed and final reports for all details, in brief -    DG CHEST PORT 1 VIEW Result Date: 02/02/2024 CLINICAL DATA:  Acute respiratory failure with hypoxia. EXAM: PORTABLE CHEST 1 VIEW COMPARISON:  01/30/2024 FINDINGS: Stable cardiomediastinal contours. Aortic atherosclerosis. Chronic coarsened interstitial markings with  diffuse bronchial Rossbach thickening. Airspace opacities within the right upper and right lower lobe and left base are again seen and appears stable from previous exam. There is new blunting of the costophrenic angles. The visualized osseous structures appear grossly intact. IMPRESSION: 1. New blunting of the costophrenic angles compatible with small pleural effusions. 2. Persistent bilateral pulmonary opacities compatible with multifocal pneumonia. 3. Chronic bronchial Tadlock thickening. Electronically Signed   By: Kimberley Penman M.D.   On: 02/02/2024 06:32   DG Chest Port 1 View Result Date: 01/30/2024 CLINICAL DATA:  Questionable sepsis - evaluate for abnormality EXAM: PORTABLE CHEST - 1 VIEW COMPARISON:  11/03/2023 FINDINGS: Coarse chronic appearing interstitial opacities right greater than left, with some improvement in the airspace component particularly the right lung base. No new infiltrate or overt edema. Heart size and mediastinal contours are within normal limits. Aortic Atherosclerosis (ICD10-170.0). No effusion. Visualized bones unremarkable. IMPRESSION: Chronic interstitial opacities with some improvement in the right basilar airspace component. Electronically Signed   By: Nicoletta Barrier M.D.   On: 01/30/2024 10:23  Micro Results   Recent Results (from the past 240 hours)  Urine Culture     Status: Abnormal   Collection Time: 01/30/24  9:24 AM   Specimen: Urine, Random  Result Value Ref Range Status   Specimen Description   Final    URINE, RANDOM Performed at [facility], [address], [place], [place] [address]    Special Requests   Final    NONE Reflexed from [id number] Performed at [facility], [address], [place], [place] [address]    Culture MULTIPLE SPECIES PRESENT, SUGGEST RECOLLECTION (A)  Final   Report Status 01/31/2024 FINAL  Final  Blood Culture (routine x 2)     Status: Abnormal   Collection Time: 01/30/24  9:51 AM   Specimen: BLOOD RIGHT WRIST  Result Value Ref Range  Status   Specimen Description   Final    BLOOD RIGHT WRIST BOTTLES DRAWN AEROBIC AND ANAEROBIC Performed at [facility], [address] [place], [place] [address]    Special Requests   Final    Blood Culture adequate volume Performed at [facility], [address] [place], [place] [address]    Culture  Setup Time   Final    GRAM NEGATIVE RODS IN BOTH AEROBIC AND ANAEROBIC BOTTLES CRITICAL RESULT CALLED TO, READ BACK BY AND VERIFIED WITH: EVANS,T ON 01/30/24 AT 2217 BY PURDIE,J CRITICAL RESULT CALLED TO, READ BACK BY AND VERIFIED WITH: Twanna Galas RN 01/31/2024 @ 0244 BY AB Performed at [facility], [address], [place], [place] [address]    Culture ESCHERICHIA COLI (A)  Final   Report Status 02/02/2024 FINAL  Final   Organism ID, Bacteria ESCHERICHIA COLI  Final   Organism ID, Bacteria ESCHERICHIA COLI  Final      Susceptibility   Escherichia coli - KIRBY BAUER*    CEFAZOLIN  INTERMEDIATE Intermediate    Escherichia coli - MIC*    AMPICILLIN  >=32 RESISTANT Resistant     CEFEPIME  <=0.12 SENSITIVE Sensitive     CEFTAZIDIME  <=1 SENSITIVE Sensitive     CEFTRIAXONE  <=0.25 SENSITIVE Sensitive     CIPROFLOXACIN  <=0.25 SENSITIVE Sensitive     GENTAMICIN  <=1 SENSITIVE Sensitive     IMIPENEM  <=0.25 SENSITIVE Sensitive     TRIMETH /SULFA  >=320 RESISTANT Resistant     AMPICILLIN /SULBACTAM 16 INTERMEDIATE Intermediate     PIP/TAZO <=4 SENSITIVE Sensitive ug/mL    * ESCHERICHIA COLI    ESCHERICHIA COLI  Blood Culture (routine x 2)     Status: Abnormal   Collection Time: 01/30/24  9:51 AM   Specimen: BLOOD LEFT ARM  Result Value Ref Range Status   Specimen Description   Final    BLOOD LEFT ARM BOTTLES DRAWN AEROBIC AND ANAEROBIC Performed at [facility], [address], [place], [place] [address]    Special Requests   Final    Blood Culture results may not be optimal due to an inadequate volume of blood received in culture bottles Performed at [facility], [address]  [place], [place] [address]    Culture  Setup Time   Final    GRAM NEGATIVE RODS IN BOTH AEROBIC AND ANAEROBIC BOTTLES CRITICAL RESULT CALLED TO, READ BACK BY AND VERIFIED WITH: EVANS,T ON 01/30/24 AT 2217 BY PURDIE,J Performed at [facility], [address], [place], [place] [address]    Culture (A)  Final    ESCHERICHIA COLI SUSCEPTIBILITIES PERFORMED ON PREVIOUS CULTURE WITHIN THE LAST 5 DAYS. Performed at [facility], [address], [place], [place] [address]    Report Status 02/02/2024 FINAL  Final  Blood Culture ID Panel (Reflexed)     Status: Abnormal   Collection Time: 01/30/24  9:51 AM  Result Value Ref Range Status   Enterococcus faecalis NOT DETECTED NOT DETECTED Final   Enterococcus Faecium NOT DETECTED NOT DETECTED Final   Listeria monocytogenes NOT DETECTED NOT DETECTED Final   Staphylococcus species NOT DETECTED NOT DETECTED Final   Staphylococcus aureus (BCID) NOT DETECTED NOT DETECTED Final   Staphylococcus epidermidis NOT DETECTED NOT DETECTED Final   Staphylococcus lugdunensis NOT DETECTED NOT DETECTED Final   Streptococcus species NOT DETECTED NOT DETECTED Final   Streptococcus agalactiae NOT DETECTED NOT DETECTED Final   Streptococcus pneumoniae NOT DETECTED NOT DETECTED Final   Streptococcus pyogenes NOT DETECTED NOT DETECTED Final   A.calcoaceticus-baumannii NOT DETECTED NOT DETECTED Final   Bacteroides fragilis NOT DETECTED NOT DETECTED Final   Enterobacterales DETECTED (A) NOT DETECTED Final    Comment: Enterobacterales represent a large order of gram negative bacteria, not a single organism. CRITICAL RESULT CALLED TO, READ BACK BY AND VERIFIED WITH: S WADE RN 01/31/2024 @ 0244 BY AB    Enterobacter cloacae complex NOT DETECTED NOT DETECTED Final   Escherichia coli DETECTED (A) NOT DETECTED Final    Comment: CRITICAL RESULT CALLED TO, READ BACK BY AND VERIFIED WITH: S WADE RN 01/31/2024 @ 0244 BY AB    Klebsiella aerogenes NOT DETECTED NOT DETECTED Final    Klebsiella oxytoca NOT DETECTED NOT DETECTED Final   Klebsiella pneumoniae NOT DETECTED NOT DETECTED Final   Proteus species NOT DETECTED NOT DETECTED Final   Salmonella species NOT DETECTED NOT DETECTED Final   Serratia marcescens NOT DETECTED NOT DETECTED Final   Haemophilus influenzae NOT DETECTED NOT DETECTED Final   Neisseria meningitidis NOT DETECTED NOT DETECTED Final   Pseudomonas aeruginosa NOT DETECTED NOT DETECTED Final   Stenotrophomonas maltophilia NOT DETECTED NOT DETECTED Final   Candida albicans NOT DETECTED NOT DETECTED Final   Candida auris NOT DETECTED NOT DETECTED Final   Candida glabrata NOT DETECTED NOT DETECTED Final   Candida krusei NOT DETECTED NOT DETECTED Final   Candida parapsilosis NOT DETECTED NOT DETECTED Final   Candida tropicalis NOT DETECTED NOT DETECTED Final   Cryptococcus neoformans/gattii NOT DETECTED NOT DETECTED Final   CTX-M ESBL NOT DETECTED NOT DETECTED Final   Carbapenem resistance IMP NOT DETECTED NOT DETECTED Final   Carbapenem resistance KPC NOT DETECTED NOT DETECTED Final   Carbapenem resistance NDM NOT DETECTED NOT DETECTED Final   Carbapenem resist OXA 48 LIKE NOT DETECTED NOT DETECTED Final   Carbapenem resistance VIM NOT DETECTED NOT DETECTED Final    Comment: Performed at [facility], [address], [place], [place] [address]  MRSA Next Gen by PCR, Nasal     Status: Abnormal   Collection Time: 01/30/24  3:23 PM   Specimen: Nasal Mucosa; Nasal Swab  Result Value Ref Range Status   MRSA by PCR Next Gen DETECTED (A) NOT DETECTED Final    Comment: RESULT CALLED TO, READ BACK BY AND VERIFIED WITH: J BERES AT 1829 ON [id number] BY S DALTON (NOTE) The GeneXpert MRSA Assay (FDA approved for NASAL specimens only), is one component of a comprehensive MRSA colonization surveillance program. It is not intended to diagnose MRSA infection nor to guide or monitor treatment for MRSA infections. Test performance is not FDA approved in  patients less than 46 years old. Performed at [facility],  [address], [place], [place] [address]    Today   Subjective    Finnis Swalley today has no new complaints  - Wife at bedside, questions answered - Cough and dyspnea improving------- chest x-ray on 02/02/2024 suggestive of possible multifocal pneumonia--antibiotics as above #2 No fever  Or chills   No Nausea, Vomiting or Diarrhea        Patient has been seen and examined prior to discharge   Objective   Blood pressure 127/86, pulse 78, temperature 97.8 F (36.6 C), temperature source Oral, resp. rate (!) 22, height 5\' 5"  (1.651 m), weight 48.8 kg, SpO2 94%.   Intake/Output Summary (Last 24 hours) at 02/02/2024 1402 Last data filed at 02/02/2024 0913 Gross per 24 hour  Intake 960 ml  Output 3850 ml  Net -2890 ml    Exam Gen:- Awake Alert, no acute distress  HEENT:- .AT, No sclera icterus Neck-Supple Neck,No JVD,.  Lungs-  CTAB , good air movement bilaterally CV- S1, S2 normal, regular Abd-  +ve B.Sounds, Abd Soft, No tenderness, suprapubic catheter and left-sided nephrostomy tube in situ    Extremity/Skin:- No  edema,   good pulses Psych-affect is appropriate, oriented x3 Neuro-neuromuscular deficits consistent with underlying multiple sclerosis ,no additional new focal deficits, no tremor    Data Review   CBC w Diff:  Lab Results  Component Value Date   WBC 9.0 02/02/2024   HGB 8.7 (L) 02/02/2024   HCT 29.2 (L) 02/02/2024   PLT 278 02/02/2024   LYMPHOPCT 3 01/30/2024   MONOPCT 4 01/30/2024   EOSPCT 0 01/30/2024   BASOPCT 0 01/30/2024    CMP:  Lab Results  Component Value Date   NA 146 (H) 02/02/2024   NA 136 (A) 09/27/2018   K 3.5 02/02/2024   CL 120 (H) 02/02/2024   CO2 16 (L) 02/02/2024   BUN 54 (H) 02/02/2024   BUN 28 (A) 09/27/2018   CREATININE 2.20 (H) 02/02/2024   GLU 90 09/27/2018   PROT 7.4 01/30/2024   ALBUMIN 2.5 (L) 01/30/2024   BILITOT 0.5 01/30/2024   ALKPHOS 99 01/30/2024    AST 24 01/30/2024   ALT 14 01/30/2024  .  Total Discharge time is about 33 minutes  Colin Dawley M.D on 02/02/2024 at 2:02 PM  Go to [email or web address] -  for contact info  [facility] - Office  [phone]

## 2024-02-02 NOTE — Care Management Important Message (Signed)
 Important Message  Patient Details  Name: NAHZIR SCHULT MRN: [id number] Date of Birth: 1941-05-02   Important Message Given:  Yes - Medicare IM     Carrine Kroboth L Powell Halbert 02/02/2024, 10:54 AM

## 2024-02-02 NOTE — Discharge Instructions (Signed)
 1)Take Levaquin --500 mg Every other day (Q 48 hrs) -starting 02/03/24 (02/03/24, 05/09, 05/11, 05/13 and 02/11/24) 2)Avoid ibuprofen /Advil /Aleve/Motrin Juluis Ok Powders/Naproxen/BC powders/Meloxicam/Diclofenac/Indomethacin and other Nonsteroidal anti-inflammatory medications as these will make you more likely to bleed and can cause stomach ulcers, can also cause Kidney problems.  3)Per urologist Dr. Claretta Croft suprapubic catheter was last changed on 01/12/2024 he recommends changing suprapubic catheter?  Next change should be around 02/11/2024 -He also recommends outpatient follow-up with IR in 1 to 2 weeks for possible change of left flank nephrostomy tube 4)you need oxygen  at home at 2 L via nasal cannula continuously while awake and while asleep--- smoking or having open fires around oxygen  can cause fire, significant injury and death

## 2024-02-02 NOTE — Progress Notes (Signed)
 Report called to [facility] LPN at [facility].

## 2024-02-02 NOTE — Plan of Care (Signed)

## 2024-02-02 NOTE — TOC Transition Note (Signed)
 Transition of Care [facility]) - Discharge Note   Patient Details  Name: Jordan Ward MRN: [id number] Date of Birth: 05-13-1941  [facility]) CM/SW Contact:  Grandville Lax, LCSWA Phone Number: 02/02/2024, 3:28 PM  Clinical Narrative:    CSW updated that pt is medically stable for D/C back to [facility] today. CSW updated Debbie in admissions and they are ready for pt to return. CSW provided RN with numbers for room and report. EMS called for transport. Med necessity printed to floor. CSW spoke with pts wife to provide update. TOC signing off.   Final next level of care: Skilled Nursing Facility Barriers to Discharge: Barriers Resolved   Patient Goals and CMS Choice Patient states their goals for this hospitalization and ongoing recovery are:: return to SNF CMS Medicare.gov Compare Post Acute Care list provided to:: Patient Choice offered to / list presented to : Patient [place] ownership interest in [facility].provided to:: Patient    Discharge Placement                Patient to be transferred to facility by: EMS Name of family member notified: Wife Patient and family notified of of transfer: 02/02/24  Discharge Plan and Services Additional resources added to the After Visit Summary for       Post Acute Care Choice: Skilled Nursing Facility                               Social Drivers of Health (SDOH) Interventions SDOH Screenings   Food Insecurity: No Food Insecurity (01/30/2024)  Recent Concern: Food Insecurity - Food Insecurity Present (11/03/2023)   Received from [facility]  Housing: Low Risk  (01/30/2024)  Transportation Needs: No Transportation Needs (01/30/2024)  Utilities: Not At Risk (01/30/2024)  Depression (PHQ2-9): Low Risk  (09/26/2022)  Financial Resource Strain: Low Risk  (11/03/2023)   Received from [facility]  Physical Activity: Inactive (11/03/2023)   Received from [facility]  Social Connections: Moderately  Isolated (01/30/2024)  Stress: No Stress Concern Present (11/03/2023)   Received from [facility]  Tobacco Use: Medium Risk (02/01/2024)  Health Literacy: High Risk (11/03/2023)   Received from [facility]     Readmission Risk Interventions    10/12/2023   11:35 AM 08/25/2022    3:58 PM 07/15/2021    9:54 AM  Readmission Risk Prevention Plan  Transportation Screening Complete Complete   PCP or Specialist Appt within 3-5 Days   Complete  Home Care Screening Complete Complete   Medication Review (RN CM) Complete Complete   HRI or Home Care Consult   Complete  Social Work Consult for Recovery Care Planning/Counseling   Complete  Palliative Care Screening   Complete  Medication Review Oceanographer)   Complete

## 2024-02-03 DIAGNOSIS — N179 Acute kidney failure, unspecified: Secondary | ICD-10-CM | POA: Diagnosis not present

## 2024-02-03 DIAGNOSIS — M6281 Muscle weakness (generalized): Secondary | ICD-10-CM | POA: Diagnosis not present

## 2024-02-03 DIAGNOSIS — G894 Chronic pain syndrome: Secondary | ICD-10-CM | POA: Diagnosis not present

## 2024-02-03 DIAGNOSIS — G35 Multiple sclerosis: Secondary | ICD-10-CM | POA: Diagnosis not present

## 2024-02-03 DIAGNOSIS — L89154 Pressure ulcer of sacral region, stage 4: Secondary | ICD-10-CM | POA: Diagnosis not present

## 2024-02-03 DIAGNOSIS — L89323 Pressure ulcer of left buttock, stage 3: Secondary | ICD-10-CM | POA: Diagnosis not present

## 2024-02-03 DIAGNOSIS — J9601 Acute respiratory failure with hypoxia: Secondary | ICD-10-CM | POA: Diagnosis not present

## 2024-02-03 DIAGNOSIS — L89620 Pressure ulcer of left heel, unstageable: Secondary | ICD-10-CM | POA: Diagnosis not present

## 2024-02-03 DIAGNOSIS — L89310 Pressure ulcer of right buttock, unstageable: Secondary | ICD-10-CM | POA: Diagnosis not present

## 2024-02-03 DIAGNOSIS — L89510 Pressure ulcer of right ankle, unstageable: Secondary | ICD-10-CM | POA: Diagnosis not present

## 2024-02-03 DIAGNOSIS — J189 Pneumonia, unspecified organism: Secondary | ICD-10-CM | POA: Diagnosis not present

## 2024-02-03 DIAGNOSIS — R159 Full incontinence of feces: Secondary | ICD-10-CM | POA: Diagnosis not present

## 2024-02-03 NOTE — Addendum Note (Signed)
 Addended by: Wess Hammed on: 02/03/2024 01:38 PM   Modules accepted: Orders

## 2024-02-04 ENCOUNTER — Encounter: Payer: Self-pay | Admitting: *Deleted

## 2024-02-04 ENCOUNTER — Other Ambulatory Visit: Payer: Self-pay | Admitting: Neurology

## 2024-02-04 DIAGNOSIS — I10 Essential (primary) hypertension: Secondary | ICD-10-CM

## 2024-02-04 DIAGNOSIS — I6310 Cerebral infarction due to embolism of unspecified precerebral artery: Secondary | ICD-10-CM

## 2024-02-04 DIAGNOSIS — J9601 Acute respiratory failure with hypoxia: Secondary | ICD-10-CM | POA: Diagnosis not present

## 2024-02-04 DIAGNOSIS — N179 Acute kidney failure, unspecified: Secondary | ICD-10-CM | POA: Diagnosis not present

## 2024-02-04 DIAGNOSIS — I639 Cerebral infarction, unspecified: Secondary | ICD-10-CM

## 2024-02-04 DIAGNOSIS — N319 Neuromuscular dysfunction of bladder, unspecified: Secondary | ICD-10-CM | POA: Diagnosis not present

## 2024-02-04 DIAGNOSIS — G35 Multiple sclerosis: Secondary | ICD-10-CM

## 2024-02-04 DIAGNOSIS — L8994 Pressure ulcer of unspecified site, stage 4: Secondary | ICD-10-CM | POA: Diagnosis not present

## 2024-02-04 DIAGNOSIS — I679 Cerebrovascular disease, unspecified: Secondary | ICD-10-CM

## 2024-02-04 DIAGNOSIS — J189 Pneumonia, unspecified organism: Secondary | ICD-10-CM | POA: Diagnosis not present

## 2024-02-04 NOTE — Progress Notes (Signed)
 Complex Care Management Care Guide Note  02/04/2024 Name: Jordan Ward MRN: [id number] DOB: 09-Jun-1941  Jordan Ward is a 83 y.o. year old male who is a primary care patient of Omie Bickers, MD and is actively engaged with the care management team. I reached out to Francoise Ishihara by phone today to assist with re-scheduling  with the RN Case Manager.  Follow up plan: Telephone appointment with complex care management team member scheduled for:  02/08/2024  Kandis Ormond, CMA [place]  [facility], [facility] Guide Direct Dial: [phone]  Fax: [phone] Website: [email or web address]

## 2024-02-05 DIAGNOSIS — R52 Pain, unspecified: Secondary | ICD-10-CM | POA: Diagnosis not present

## 2024-02-05 DIAGNOSIS — J449 Chronic obstructive pulmonary disease, unspecified: Secondary | ICD-10-CM | POA: Diagnosis not present

## 2024-02-05 DIAGNOSIS — J189 Pneumonia, unspecified organism: Secondary | ICD-10-CM | POA: Diagnosis not present

## 2024-02-05 DIAGNOSIS — N179 Acute kidney failure, unspecified: Secondary | ICD-10-CM | POA: Diagnosis not present

## 2024-02-05 DIAGNOSIS — I639 Cerebral infarction, unspecified: Secondary | ICD-10-CM | POA: Diagnosis not present

## 2024-02-05 DIAGNOSIS — L89154 Pressure ulcer of sacral region, stage 4: Secondary | ICD-10-CM | POA: Diagnosis not present

## 2024-02-05 DIAGNOSIS — J8410 Pulmonary fibrosis, unspecified: Secondary | ICD-10-CM | POA: Diagnosis not present

## 2024-02-05 DIAGNOSIS — N312 Flaccid neuropathic bladder, not elsewhere classified: Secondary | ICD-10-CM | POA: Diagnosis not present

## 2024-02-05 DIAGNOSIS — M6281 Muscle weakness (generalized): Secondary | ICD-10-CM | POA: Diagnosis not present

## 2024-02-05 DIAGNOSIS — J439 Emphysema, unspecified: Secondary | ICD-10-CM | POA: Diagnosis not present

## 2024-02-05 DIAGNOSIS — J9601 Acute respiratory failure with hypoxia: Secondary | ICD-10-CM | POA: Diagnosis not present

## 2024-02-05 DIAGNOSIS — L89626 Pressure-induced deep tissue damage of left heel: Secondary | ICD-10-CM | POA: Diagnosis not present

## 2024-02-05 DIAGNOSIS — G35 Multiple sclerosis: Secondary | ICD-10-CM | POA: Diagnosis not present

## 2024-02-05 DIAGNOSIS — M4628 Osteomyelitis of vertebra, sacral and sacrococcygeal region: Secondary | ICD-10-CM | POA: Diagnosis not present

## 2024-02-05 DIAGNOSIS — I679 Cerebrovascular disease, unspecified: Secondary | ICD-10-CM | POA: Diagnosis not present

## 2024-02-05 DIAGNOSIS — E440 Moderate protein-calorie malnutrition: Secondary | ICD-10-CM | POA: Diagnosis not present

## 2024-02-05 NOTE — Progress Notes (Signed)
 [facility] Liaison Note  02/05/2024  Jordan Ward May 11, 1941 [id number]  Location: RN Hospital Liaison screened the patient remotely at [facility].  Insurance: Medicare   Jordan Ward is a 83 y.o. male who is a Primary Care Patient of [place], Lethia Raveling, MD The patient was screened for readmission hospitalization with noted extreme risk score for unplanned readmission risk with 3 IP in 6 months.  The patient was assessed for potential Care Management service needs for post hospital transition for care coordination. Review of patient's electronic medical record reveals patient was admitted for Severe Sepsis. Pt transitioned back to [facility] facility for ongoing rehab. This facility will continue to address pt's ongoing needs. Liaison will collaborate with the RNCM with [facility].    [facility] Care Management/Population Health does not replace or interfere with any arrangements made by the Inpatient Transition of Care team.   For questions contact:   Lilla Reichert, RN, BSN Hospital Liaison [place]   [facility], Population Health Office Hours MTWF  8:00 am-6:00 pm Direct Dial: [phone] mobile [email or web address]

## 2024-02-08 ENCOUNTER — Encounter: Payer: Self-pay | Admitting: *Deleted

## 2024-02-08 ENCOUNTER — Other Ambulatory Visit: Payer: Self-pay | Admitting: *Deleted

## 2024-02-08 DIAGNOSIS — J189 Pneumonia, unspecified organism: Secondary | ICD-10-CM | POA: Diagnosis not present

## 2024-02-08 DIAGNOSIS — J9601 Acute respiratory failure with hypoxia: Secondary | ICD-10-CM | POA: Diagnosis not present

## 2024-02-08 DIAGNOSIS — M6281 Muscle weakness (generalized): Secondary | ICD-10-CM | POA: Diagnosis not present

## 2024-02-08 DIAGNOSIS — N179 Acute kidney failure, unspecified: Secondary | ICD-10-CM | POA: Diagnosis not present

## 2024-02-08 NOTE — Patient Outreach (Signed)
 Error in opening duplicate note for patient now residing in skilled nursing facility at the time of this outreach. Care being provided by the skilled nursing facility    [place] L. Mcarthur Speedy, RN, BSN, CCM [place]  [facility], [facility] RN Care Manager Direct Dial: [phone]  Fax: [phone]

## 2024-02-10 ENCOUNTER — Ambulatory Visit

## 2024-02-10 DIAGNOSIS — L89510 Pressure ulcer of right ankle, unstageable: Secondary | ICD-10-CM | POA: Diagnosis not present

## 2024-02-10 DIAGNOSIS — J15212 Pneumonia due to Methicillin resistant Staphylococcus aureus: Secondary | ICD-10-CM | POA: Diagnosis not present

## 2024-02-10 DIAGNOSIS — L89629 Pressure ulcer of left heel, unspecified stage: Secondary | ICD-10-CM | POA: Diagnosis not present

## 2024-02-10 DIAGNOSIS — E785 Hyperlipidemia, unspecified: Secondary | ICD-10-CM | POA: Diagnosis not present

## 2024-02-10 DIAGNOSIS — J9601 Acute respiratory failure with hypoxia: Secondary | ICD-10-CM | POA: Diagnosis not present

## 2024-02-10 DIAGNOSIS — E8770 Fluid overload, unspecified: Secondary | ICD-10-CM | POA: Diagnosis not present

## 2024-02-10 DIAGNOSIS — F419 Anxiety disorder, unspecified: Secondary | ICD-10-CM | POA: Diagnosis not present

## 2024-02-10 DIAGNOSIS — Z79891 Long term (current) use of opiate analgesic: Secondary | ICD-10-CM | POA: Diagnosis not present

## 2024-02-10 DIAGNOSIS — G35 Multiple sclerosis: Secondary | ICD-10-CM | POA: Diagnosis not present

## 2024-02-10 DIAGNOSIS — R918 Other nonspecific abnormal finding of lung field: Secondary | ICD-10-CM | POA: Diagnosis not present

## 2024-02-10 DIAGNOSIS — Z515 Encounter for palliative care: Secondary | ICD-10-CM | POA: Diagnosis not present

## 2024-02-10 DIAGNOSIS — T83511A Infection and inflammatory reaction due to indwelling urethral catheter, initial encounter: Secondary | ICD-10-CM | POA: Diagnosis not present

## 2024-02-10 DIAGNOSIS — E872 Acidosis, unspecified: Secondary | ICD-10-CM | POA: Diagnosis not present

## 2024-02-10 DIAGNOSIS — J8410 Pulmonary fibrosis, unspecified: Secondary | ICD-10-CM | POA: Diagnosis not present

## 2024-02-10 DIAGNOSIS — N1832 Chronic kidney disease, stage 3b: Secondary | ICD-10-CM | POA: Diagnosis not present

## 2024-02-10 DIAGNOSIS — J449 Chronic obstructive pulmonary disease, unspecified: Secondary | ICD-10-CM | POA: Diagnosis not present

## 2024-02-10 DIAGNOSIS — R1312 Dysphagia, oropharyngeal phase: Secondary | ICD-10-CM | POA: Diagnosis not present

## 2024-02-10 DIAGNOSIS — R531 Weakness: Secondary | ICD-10-CM | POA: Diagnosis not present

## 2024-02-10 DIAGNOSIS — S91309A Unspecified open wound, unspecified foot, initial encounter: Secondary | ICD-10-CM | POA: Diagnosis not present

## 2024-02-10 DIAGNOSIS — E876 Hypokalemia: Secondary | ICD-10-CM | POA: Diagnosis not present

## 2024-02-10 DIAGNOSIS — L89154 Pressure ulcer of sacral region, stage 4: Secondary | ICD-10-CM | POA: Diagnosis not present

## 2024-02-10 DIAGNOSIS — L89159 Pressure ulcer of sacral region, unspecified stage: Secondary | ICD-10-CM | POA: Diagnosis not present

## 2024-02-10 DIAGNOSIS — Z66 Do not resuscitate: Secondary | ICD-10-CM | POA: Diagnosis not present

## 2024-02-10 DIAGNOSIS — M6281 Muscle weakness (generalized): Secondary | ICD-10-CM | POA: Diagnosis not present

## 2024-02-10 DIAGNOSIS — Z792 Long term (current) use of antibiotics: Secondary | ICD-10-CM | POA: Diagnosis not present

## 2024-02-10 DIAGNOSIS — N99521 Infection of other external stoma of urinary tract: Secondary | ICD-10-CM | POA: Diagnosis not present

## 2024-02-10 DIAGNOSIS — K219 Gastro-esophageal reflux disease without esophagitis: Secondary | ICD-10-CM | POA: Diagnosis not present

## 2024-02-10 DIAGNOSIS — L89619 Pressure ulcer of right heel, unspecified stage: Secondary | ICD-10-CM | POA: Diagnosis not present

## 2024-02-10 DIAGNOSIS — R0902 Hypoxemia: Secondary | ICD-10-CM | POA: Diagnosis not present

## 2024-02-10 DIAGNOSIS — Z87891 Personal history of nicotine dependence: Secondary | ICD-10-CM | POA: Diagnosis not present

## 2024-02-10 DIAGNOSIS — I679 Cerebrovascular disease, unspecified: Secondary | ICD-10-CM | POA: Diagnosis not present

## 2024-02-10 DIAGNOSIS — Z7401 Bed confinement status: Secondary | ICD-10-CM | POA: Diagnosis not present

## 2024-02-10 DIAGNOSIS — N179 Acute kidney failure, unspecified: Secondary | ICD-10-CM | POA: Diagnosis not present

## 2024-02-10 DIAGNOSIS — I639 Cerebral infarction, unspecified: Secondary | ICD-10-CM | POA: Diagnosis not present

## 2024-02-10 DIAGNOSIS — R5381 Other malaise: Secondary | ICD-10-CM | POA: Diagnosis not present

## 2024-02-10 DIAGNOSIS — E440 Moderate protein-calorie malnutrition: Secondary | ICD-10-CM | POA: Diagnosis not present

## 2024-02-10 DIAGNOSIS — R278 Other lack of coordination: Secondary | ICD-10-CM | POA: Diagnosis not present

## 2024-02-10 DIAGNOSIS — L89620 Pressure ulcer of left heel, unstageable: Secondary | ICD-10-CM | POA: Diagnosis not present

## 2024-02-10 DIAGNOSIS — Z0389 Encounter for observation for other suspected diseases and conditions ruled out: Secondary | ICD-10-CM | POA: Diagnosis not present

## 2024-02-10 DIAGNOSIS — Z79899 Other long term (current) drug therapy: Secondary | ICD-10-CM | POA: Diagnosis not present

## 2024-02-10 DIAGNOSIS — R159 Full incontinence of feces: Secondary | ICD-10-CM | POA: Diagnosis not present

## 2024-02-10 DIAGNOSIS — L89310 Pressure ulcer of right buttock, unstageable: Secondary | ICD-10-CM | POA: Diagnosis not present

## 2024-02-10 DIAGNOSIS — R52 Pain, unspecified: Secondary | ICD-10-CM | POA: Diagnosis not present

## 2024-02-10 DIAGNOSIS — J440 Chronic obstructive pulmonary disease with acute lower respiratory infection: Secondary | ICD-10-CM | POA: Diagnosis not present

## 2024-02-10 DIAGNOSIS — R0602 Shortness of breath: Secondary | ICD-10-CM | POA: Diagnosis not present

## 2024-02-10 DIAGNOSIS — I2510 Atherosclerotic heart disease of native coronary artery without angina pectoris: Secondary | ICD-10-CM | POA: Diagnosis not present

## 2024-02-10 DIAGNOSIS — Z681 Body mass index (BMI) 19 or less, adult: Secondary | ICD-10-CM | POA: Diagnosis not present

## 2024-02-10 DIAGNOSIS — R54 Age-related physical debility: Secondary | ICD-10-CM | POA: Diagnosis not present

## 2024-02-10 DIAGNOSIS — I130 Hypertensive heart and chronic kidney disease with heart failure and stage 1 through stage 4 chronic kidney disease, or unspecified chronic kidney disease: Secondary | ICD-10-CM | POA: Diagnosis not present

## 2024-02-10 DIAGNOSIS — Z79890 Hormone replacement therapy (postmenopausal): Secondary | ICD-10-CM | POA: Diagnosis not present

## 2024-02-10 DIAGNOSIS — R1310 Dysphagia, unspecified: Secondary | ICD-10-CM | POA: Diagnosis not present

## 2024-02-10 DIAGNOSIS — I959 Hypotension, unspecified: Secondary | ICD-10-CM | POA: Diagnosis not present

## 2024-02-10 DIAGNOSIS — J189 Pneumonia, unspecified organism: Secondary | ICD-10-CM | POA: Diagnosis not present

## 2024-02-10 DIAGNOSIS — I10 Essential (primary) hypertension: Secondary | ICD-10-CM | POA: Diagnosis not present

## 2024-02-10 DIAGNOSIS — I5023 Acute on chronic systolic (congestive) heart failure: Secondary | ICD-10-CM | POA: Diagnosis not present

## 2024-02-10 DIAGNOSIS — Z7982 Long term (current) use of aspirin: Secondary | ICD-10-CM | POA: Diagnosis not present

## 2024-02-10 DIAGNOSIS — K5900 Constipation, unspecified: Secondary | ICD-10-CM | POA: Diagnosis not present

## 2024-02-10 DIAGNOSIS — J9611 Chronic respiratory failure with hypoxia: Secondary | ICD-10-CM | POA: Diagnosis not present

## 2024-02-10 DIAGNOSIS — J984 Other disorders of lung: Secondary | ICD-10-CM | POA: Diagnosis not present

## 2024-02-10 DIAGNOSIS — E039 Hypothyroidism, unspecified: Secondary | ICD-10-CM | POA: Diagnosis not present

## 2024-02-10 DIAGNOSIS — Z5941 Food insecurity: Secondary | ICD-10-CM | POA: Diagnosis not present

## 2024-02-10 DIAGNOSIS — N184 Chronic kidney disease, stage 4 (severe): Secondary | ICD-10-CM | POA: Diagnosis not present

## 2024-02-10 DIAGNOSIS — L89320 Pressure ulcer of left buttock, unstageable: Secondary | ICD-10-CM | POA: Diagnosis not present

## 2024-02-10 DIAGNOSIS — N312 Flaccid neuropathic bladder, not elsewhere classified: Secondary | ICD-10-CM | POA: Diagnosis not present

## 2024-02-10 DIAGNOSIS — N390 Urinary tract infection, site not specified: Secondary | ICD-10-CM | POA: Diagnosis not present

## 2024-02-10 DIAGNOSIS — G894 Chronic pain syndrome: Secondary | ICD-10-CM | POA: Diagnosis not present

## 2024-02-10 DIAGNOSIS — Z960 Presence of urogenital implants: Secondary | ICD-10-CM | POA: Diagnosis not present

## 2024-02-10 DIAGNOSIS — N261 Atrophy of kidney (terminal): Secondary | ICD-10-CM | POA: Diagnosis not present

## 2024-02-10 DIAGNOSIS — Z9359 Other cystostomy status: Secondary | ICD-10-CM | POA: Diagnosis not present

## 2024-02-10 DIAGNOSIS — R000 Tachycardia, unspecified: Secondary | ICD-10-CM | POA: Diagnosis not present

## 2024-02-10 DIAGNOSIS — M4628 Osteomyelitis of vertebra, sacral and sacrococcygeal region: Secondary | ICD-10-CM | POA: Diagnosis not present

## 2024-02-12 ENCOUNTER — Ambulatory Visit

## 2024-02-15 ENCOUNTER — Ambulatory Visit

## 2024-02-16 ENCOUNTER — Encounter: Admitting: Vascular Surgery

## 2024-02-16 ENCOUNTER — Other Ambulatory Visit (HOSPITAL_COMMUNITY)

## 2024-02-17 DIAGNOSIS — R278 Other lack of coordination: Secondary | ICD-10-CM | POA: Diagnosis not present

## 2024-02-17 DIAGNOSIS — K439 Ventral hernia without obstruction or gangrene: Secondary | ICD-10-CM | POA: Diagnosis not present

## 2024-02-17 DIAGNOSIS — I5023 Acute on chronic systolic (congestive) heart failure: Secondary | ICD-10-CM | POA: Diagnosis not present

## 2024-02-17 DIAGNOSIS — E876 Hypokalemia: Secondary | ICD-10-CM | POA: Diagnosis not present

## 2024-02-17 DIAGNOSIS — I6310 Cerebral infarction due to embolism of unspecified precerebral artery: Secondary | ICD-10-CM | POA: Diagnosis not present

## 2024-02-17 DIAGNOSIS — J984 Other disorders of lung: Secondary | ICD-10-CM | POA: Diagnosis not present

## 2024-02-17 DIAGNOSIS — E785 Hyperlipidemia, unspecified: Secondary | ICD-10-CM | POA: Diagnosis not present

## 2024-02-17 DIAGNOSIS — Z960 Presence of urogenital implants: Secondary | ICD-10-CM | POA: Diagnosis not present

## 2024-02-17 DIAGNOSIS — N390 Urinary tract infection, site not specified: Secondary | ICD-10-CM | POA: Diagnosis not present

## 2024-02-17 DIAGNOSIS — R159 Full incontinence of feces: Secondary | ICD-10-CM | POA: Diagnosis not present

## 2024-02-17 DIAGNOSIS — D649 Anemia, unspecified: Secondary | ICD-10-CM | POA: Diagnosis not present

## 2024-02-17 DIAGNOSIS — L89629 Pressure ulcer of left heel, unspecified stage: Secondary | ICD-10-CM | POA: Diagnosis not present

## 2024-02-17 DIAGNOSIS — J449 Chronic obstructive pulmonary disease, unspecified: Secondary | ICD-10-CM | POA: Diagnosis not present

## 2024-02-17 DIAGNOSIS — K5900 Constipation, unspecified: Secondary | ICD-10-CM | POA: Diagnosis not present

## 2024-02-17 DIAGNOSIS — R918 Other nonspecific abnormal finding of lung field: Secondary | ICD-10-CM | POA: Diagnosis not present

## 2024-02-17 DIAGNOSIS — N312 Flaccid neuropathic bladder, not elsewhere classified: Secondary | ICD-10-CM | POA: Diagnosis not present

## 2024-02-17 DIAGNOSIS — J189 Pneumonia, unspecified organism: Secondary | ICD-10-CM | POA: Diagnosis not present

## 2024-02-17 DIAGNOSIS — I679 Cerebrovascular disease, unspecified: Secondary | ICD-10-CM | POA: Diagnosis not present

## 2024-02-17 DIAGNOSIS — Z7401 Bed confinement status: Secondary | ICD-10-CM | POA: Diagnosis not present

## 2024-02-17 DIAGNOSIS — N184 Chronic kidney disease, stage 4 (severe): Secondary | ICD-10-CM | POA: Diagnosis not present

## 2024-02-17 DIAGNOSIS — L89314 Pressure ulcer of right buttock, stage 4: Secondary | ICD-10-CM | POA: Diagnosis not present

## 2024-02-17 DIAGNOSIS — T83022A Displacement of nephrostomy catheter, initial encounter: Secondary | ICD-10-CM | POA: Diagnosis not present

## 2024-02-17 DIAGNOSIS — I2510 Atherosclerotic heart disease of native coronary artery without angina pectoris: Secondary | ICD-10-CM | POA: Diagnosis not present

## 2024-02-17 DIAGNOSIS — N201 Calculus of ureter: Secondary | ICD-10-CM | POA: Diagnosis not present

## 2024-02-17 DIAGNOSIS — J8410 Pulmonary fibrosis, unspecified: Secondary | ICD-10-CM | POA: Diagnosis not present

## 2024-02-17 DIAGNOSIS — L89620 Pressure ulcer of left heel, unstageable: Secondary | ICD-10-CM | POA: Diagnosis not present

## 2024-02-17 DIAGNOSIS — L89616 Pressure-induced deep tissue damage of right heel: Secondary | ICD-10-CM | POA: Diagnosis not present

## 2024-02-17 DIAGNOSIS — Z0389 Encounter for observation for other suspected diseases and conditions ruled out: Secondary | ICD-10-CM | POA: Diagnosis not present

## 2024-02-17 DIAGNOSIS — R52 Pain, unspecified: Secondary | ICD-10-CM | POA: Diagnosis not present

## 2024-02-17 DIAGNOSIS — L89896 Pressure-induced deep tissue damage of other site: Secondary | ICD-10-CM | POA: Diagnosis not present

## 2024-02-17 DIAGNOSIS — Z9359 Other cystostomy status: Secondary | ICD-10-CM | POA: Diagnosis not present

## 2024-02-17 DIAGNOSIS — I959 Hypotension, unspecified: Secondary | ICD-10-CM | POA: Diagnosis not present

## 2024-02-17 DIAGNOSIS — J439 Emphysema, unspecified: Secondary | ICD-10-CM | POA: Diagnosis not present

## 2024-02-17 DIAGNOSIS — R5381 Other malaise: Secondary | ICD-10-CM | POA: Diagnosis not present

## 2024-02-17 DIAGNOSIS — N261 Atrophy of kidney (terminal): Secondary | ICD-10-CM | POA: Diagnosis not present

## 2024-02-17 DIAGNOSIS — L89154 Pressure ulcer of sacral region, stage 4: Secondary | ICD-10-CM | POA: Diagnosis not present

## 2024-02-17 DIAGNOSIS — N1832 Chronic kidney disease, stage 3b: Secondary | ICD-10-CM | POA: Diagnosis not present

## 2024-02-17 DIAGNOSIS — L89510 Pressure ulcer of right ankle, unstageable: Secondary | ICD-10-CM | POA: Diagnosis not present

## 2024-02-17 DIAGNOSIS — M6281 Muscle weakness (generalized): Secondary | ICD-10-CM | POA: Diagnosis not present

## 2024-02-17 DIAGNOSIS — R1312 Dysphagia, oropharyngeal phase: Secondary | ICD-10-CM | POA: Diagnosis not present

## 2024-02-17 DIAGNOSIS — R1310 Dysphagia, unspecified: Secondary | ICD-10-CM | POA: Diagnosis not present

## 2024-02-17 DIAGNOSIS — L89619 Pressure ulcer of right heel, unspecified stage: Secondary | ICD-10-CM | POA: Diagnosis not present

## 2024-02-17 DIAGNOSIS — G35 Multiple sclerosis: Secondary | ICD-10-CM | POA: Diagnosis present

## 2024-02-17 DIAGNOSIS — I639 Cerebral infarction, unspecified: Secondary | ICD-10-CM | POA: Diagnosis not present

## 2024-02-17 DIAGNOSIS — E440 Moderate protein-calorie malnutrition: Secondary | ICD-10-CM | POA: Diagnosis present

## 2024-02-17 DIAGNOSIS — L89320 Pressure ulcer of left buttock, unstageable: Secondary | ICD-10-CM | POA: Diagnosis not present

## 2024-02-17 DIAGNOSIS — L8995 Pressure ulcer of unspecified site, unstageable: Secondary | ICD-10-CM | POA: Diagnosis not present

## 2024-02-17 DIAGNOSIS — F419 Anxiety disorder, unspecified: Secondary | ICD-10-CM | POA: Diagnosis present

## 2024-02-17 DIAGNOSIS — Z7982 Long term (current) use of aspirin: Secondary | ICD-10-CM | POA: Diagnosis not present

## 2024-02-17 DIAGNOSIS — R0602 Shortness of breath: Secondary | ICD-10-CM | POA: Diagnosis not present

## 2024-02-17 DIAGNOSIS — I10 Essential (primary) hypertension: Secondary | ICD-10-CM | POA: Diagnosis not present

## 2024-02-17 DIAGNOSIS — Y732 Prosthetic and other implants, materials and accessory gastroenterology and urology devices associated with adverse incidents: Secondary | ICD-10-CM | POA: Diagnosis not present

## 2024-02-17 DIAGNOSIS — R531 Weakness: Secondary | ICD-10-CM | POA: Diagnosis not present

## 2024-02-17 DIAGNOSIS — E8770 Fluid overload, unspecified: Secondary | ICD-10-CM | POA: Diagnosis not present

## 2024-02-17 DIAGNOSIS — M4628 Osteomyelitis of vertebra, sacral and sacrococcygeal region: Secondary | ICD-10-CM | POA: Diagnosis present

## 2024-02-17 DIAGNOSIS — G894 Chronic pain syndrome: Secondary | ICD-10-CM | POA: Diagnosis not present

## 2024-02-17 DIAGNOSIS — N281 Cyst of kidney, acquired: Secondary | ICD-10-CM | POA: Diagnosis not present

## 2024-02-18 DIAGNOSIS — M4628 Osteomyelitis of vertebra, sacral and sacrococcygeal region: Secondary | ICD-10-CM | POA: Diagnosis not present

## 2024-02-18 DIAGNOSIS — E440 Moderate protein-calorie malnutrition: Secondary | ICD-10-CM | POA: Diagnosis not present

## 2024-02-21 ENCOUNTER — Encounter (HOSPITAL_COMMUNITY)
Admission: RE | Admit: 2024-02-21 | Discharge: 2024-02-21 | Disposition: A | Discharge status: Home / Self Care | Source: Skilled Nursing Facility

## 2024-02-21 ENCOUNTER — Encounter (HOSPITAL_COMMUNITY): Payer: Self-pay

## 2024-02-21 ENCOUNTER — Other Ambulatory Visit: Payer: Self-pay

## 2024-02-21 ENCOUNTER — Emergency Department (HOSPITAL_COMMUNITY)
Admission: EM | Admit: 2024-02-21 | Discharge: 2024-02-22 | Disposition: A | Discharge status: Home / Self Care | Attending: Emergency Medicine | Admitting: Emergency Medicine

## 2024-02-21 DIAGNOSIS — Y732 Prosthetic and other implants, materials and accessory gastroenterology and urology devices associated with adverse incidents: Secondary | ICD-10-CM | POA: Diagnosis not present

## 2024-02-21 DIAGNOSIS — N281 Cyst of kidney, acquired: Secondary | ICD-10-CM | POA: Diagnosis not present

## 2024-02-21 DIAGNOSIS — T83022A Displacement of nephrostomy catheter, initial encounter: Secondary | ICD-10-CM | POA: Insufficient documentation

## 2024-02-21 DIAGNOSIS — N201 Calculus of ureter: Secondary | ICD-10-CM | POA: Insufficient documentation

## 2024-02-21 DIAGNOSIS — Z7982 Long term (current) use of aspirin: Secondary | ICD-10-CM | POA: Diagnosis not present

## 2024-02-21 DIAGNOSIS — R918 Other nonspecific abnormal finding of lung field: Secondary | ICD-10-CM | POA: Diagnosis not present

## 2024-02-21 DIAGNOSIS — N184 Chronic kidney disease, stage 4 (severe): Secondary | ICD-10-CM | POA: Diagnosis not present

## 2024-02-21 DIAGNOSIS — N390 Urinary tract infection, site not specified: Secondary | ICD-10-CM

## 2024-02-21 DIAGNOSIS — N312 Flaccid neuropathic bladder, not elsewhere classified: Secondary | ICD-10-CM | POA: Diagnosis not present

## 2024-02-21 DIAGNOSIS — K439 Ventral hernia without obstruction or gangrene: Secondary | ICD-10-CM | POA: Diagnosis not present

## 2024-02-21 LAB — COMPREHENSIVE METABOLIC PANEL WITH GFR
ALT: 24 U/L (ref 0–44)
AST: 17 U/L (ref 15–41)
Albumin: 2.3 g/dL — ABNORMAL LOW (ref 3.5–5.0)
Alkaline Phosphatase: 84 U/L (ref 38–126)
Anion gap: 11 (ref 5–15)
BUN: 45 mg/dL — ABNORMAL HIGH (ref 8–23)
CO2: 21 mmol/L — ABNORMAL LOW (ref 22–32)
Calcium: 9.2 mg/dL (ref 8.9–10.3)
Chloride: 109 mmol/L (ref 98–111)
Creatinine, Ser: 2.06 mg/dL — ABNORMAL HIGH (ref 0.61–1.24)
GFR, Estimated: 32 mL/min — ABNORMAL LOW (ref >60)
Glucose, Bld: 88 mg/dL (ref 70–99)
Potassium: 5.3 mmol/L — ABNORMAL HIGH (ref 3.5–5.1)
Sodium: 141 mmol/L (ref 135–145)
Total Bilirubin: 0.5 mg/dL (ref 0.0–1.2)
Total Protein: 7.5 g/dL (ref 6.5–8.1)

## 2024-02-21 NOTE — ED Notes (Signed)
 This nurse attempted to start an IV 2xs with no success. IV team order placed. This nurse will also consult other RN/Medic on the floor to assist. MD made aware.

## 2024-02-21 NOTE — ED Provider Notes (Signed)
 [place] EMERGENCY DEPARTMENT AT [facility] Provider Note   CSN: [id number] Arrival date & time: 02/21/24  2030     History  Chief Complaint  Patient presents with   Flank Pain    Nephrostomy tube     Jordan Ward is a 83 y.o. male.  83 year old male presenting emergency department with dislodged nephrostomy tube.  He has had an instance in 2024 and has been exchanged several times with IR.  He reports rolling over and pulling tube out roughly 30 minutes prior to arrival.  Having some minor discomfort around the insertion site, but no fevers no chills no chest pain no shortness of breath no abdominal pain no nausea vomiting diarrhea.   Flank Pain       Home Medications Prior to Admission medications   Medication Sig Start Date End Date Taking? Authorizing Provider  acetaminophen  (TYLENOL ) 325 MG tablet Take 650 mg by mouth every 4 (four) hours as needed for mild pain (pain score 1-3), moderate pain (pain score 4-6) or fever.    [provider]  Amino Acids-Protein Hydrolys (PRO-STAT PROFILE PO) Take 30 mLs by mouth 2 (two) times daily.    [provider]  amLODipine  (NORVASC ) 5 MG tablet Take 1 tablet (5 mg total) by mouth daily. 02/03/24   Colin Dawley, MD  ascorbic acid  (VITAMIN C) 500 MG tablet Take 500 mg by mouth every morning.    [provider]  aspirin  81 MG chewable tablet Chew 1 tablet (81 mg total) by mouth daily with breakfast. 02/02/24   Emokpae, Courage, MD  busPIRone (BUSPAR) 5 MG tablet Take 5 mg by mouth 3 (three) times daily.    [provider]  carboxymethylcellulose 1 % ophthalmic solution Place 2 drops into both eyes every 4 (four) hours as needed (dry eyes).    [provider]  diazepam  (VALIUM ) 2 MG tablet Take 1 tablet (2 mg total) by mouth 2 (two) times daily. 02/02/24   Colin Dawley, MD  HYDROcodone -acetaminophen  (NORCO/VICODIN) 5-325 MG tablet Take 1 tablet by mouth every 8 (eight) hours as  needed for moderate pain (pain score 4-6). 02/02/24   Colin Dawley, MD  levofloxacin  (LEVAQUIN ) 500 MG tablet Take 1 tablet (500 mg total) by mouth See admin instructions. Every other day (Q 48 hrs) -starting 02/03/24 (02/03/24, 05/09, 05/11, 05/13 and 02/11/24) 02/02/24   Colin Dawley, MD  levothyroxine  (SYNTHROID ) 75 MCG tablet Take 75 mcg by mouth in the morning. 07/02/21   [provider]  melatonin 3 MG TABS tablet Take 3 mg by mouth daily as needed (sleep).    [provider]  methocarbamol  (ROBAXIN ) 500 MG tablet Take 500 mg by mouth every 6 (six) hours as needed for muscle spasms.    [provider]  mirabegron  ER (MYRBETRIQ ) 50 MG TB24 tablet Take 1 tablet (50 mg total) by mouth daily. Patient not taking: Reported on 02/02/2024 10/12/23   Doreene Gammon D, DO  mirtazapine  (REMERON ) 15 MG tablet Take 0.5 tablets (7.5 mg total) by mouth at bedtime. 02/02/24   Colin Dawley, MD  Multiple Vitamin (MULTIVITAMIN) tablet Take 1 tablet by mouth every morning.    [provider]  oxybutynin  (DITROPAN -XL) 5 MG 24 hr tablet Take 5 mg by mouth every morning.    [provider]  OXYGEN  Inhale 1 L into the lungs every evening. Patient not taking: Reported on 02/02/2024    [provider]  pantoprazole  (PROTONIX ) 40 MG tablet Take 1 tablet (  40 mg total) by mouth daily. 10/12/23   Mason Sole, Pratik D, DO  polyethylene glycol (MIRALAX  / GLYCOLAX ) 17 g packet Take 17 g by mouth every morning.    [provider]  Potassium Bicarb-Citric Acid 20 MEQ TBEF Take 20 mEq by mouth every morning.    [provider]  senna-docusate (SENOKOT-S) 8.6-50 MG tablet Take 2 tablets by mouth at bedtime. 02/02/24   Colin Dawley, MD  sodium chloride  (OCEAN) 0.65 % SOLN nasal spray Place 1 spray into both nostrils in the morning, at noon, in the evening, and at bedtime.    [provider]  thiamine  (VITAMIN B-1) 100 MG tablet Take 1 tablet (100 mg total)  by mouth daily. 10/12/23   Mason Sole, Pratik D, DO      Allergies    Ciprofloxacin  and Tetracyclines & related    Review of Systems   Review of Systems  Genitourinary:  Positive for flank pain.    Physical Exam Updated Vital Signs BP 125/77 (BP Location: Left Arm)   Pulse 84   Temp 98 F (36.7 C) (Oral)   Resp 17   SpO2 100%  Physical Exam Vitals and nursing note reviewed.  Constitutional:      General: He is not in acute distress.    Appearance: He is not toxic-appearing.  HENT:     Head: Normocephalic.     Nose: Nose normal.     Mouth/Throat:     Mouth: Mucous membranes are moist.  Eyes:     Conjunctiva/sclera: Conjunctivae normal.  Cardiovascular:     Rate and Rhythm: Normal rate and regular rhythm.  Pulmonary:     Effort: Pulmonary effort is normal.     Breath sounds: Normal breath sounds.  Abdominal:     General: Abdomen is flat. There is no distension.     Palpations: Abdomen is soft.     Tenderness: There is no abdominal tenderness. There is no guarding or rebound.     Hernia: A hernia is present.     Comments: Left gastrostomy tube site with no erythema induration fluctuance or crepitus.  Suprapubic catheter in place with output  Genitourinary:    Comments: Sacral decubitus ulcer Musculoskeletal:     Right lower leg: No edema.     Left lower leg: No edema.  Skin:    Capillary Refill: Capillary refill takes less than 2 seconds.  Neurological:     Mental Status: He is alert and oriented to person, place, and time.  Psychiatric:        Mood and Affect: Mood normal.        Behavior: Behavior normal.     ED Results / Procedures / Treatments   Labs (all labs ordered are listed, but only abnormal results are displayed) Labs Reviewed  CBC  BASIC METABOLIC PANEL WITH GFR    EKG None  Radiology No results found.  Procedures Procedures    Medications Ordered in ED Medications - No data to display  ED Course/ Medical Decision Making/ A&P Clinical  Course as of 02/21/24 2359  Paulene Boron Feb 21, 2024  2137 Urology note : '1.14.2024: Arhaan is here today for routine check/catheter change.  Still getting his nephrostomy tube changed about every 6 weeks, his suprapubic changed here every month.  Just discharged from the hospital yesterday for management of a minor stroke as well as pneumonia.  He is in the nursing facility temporarily.  Most recent CT scan revealed a 10 mm left renal stone.  4.15.2025: Here for routine f/u. Needed Lt perc tube replaced in late January.  Since he has been spending time in the nursing facility, he and his wife are concerned about secure treatment of his nephrostomy tube.  [TY]  2208 Per chart review nephrostomy tube placed : This was apparently placed on 09/15/2023.  [TY]  2256 Spoke with IR. Admit and can replace nephrostomy tube tomorrow. No need to keep NPO.  [TY]    Clinical Course User Index [TY] Rolinda Climes, DO                                 Medical Decision Making This is an 83 year old male presenting emergency department for dislodged nephrostomy tube.  Afebrile vital signs reassuring benign abdominal exam.  Insertion site of nephrostomy tube does not appear to be infected.  Case discussed with IR who will be able to  replace nephrostomy tube tomorrow.  Basic labs ordered for admission, IV team having difficult time getting access.  Care signed out to overnight team.  Admitted for nephrostomy tube exchange tomorrow. See ed course for further MDM./   Amount and/or Complexity of Data Reviewed External Data Reviewed:     Details: See ED course.  Labs: ordered. Radiology:     Details: Considered CT scan, however benign abdominal exam. Stable vitals.   Risk Decision regarding hospitalization. Diagnosis or treatment significantly limited by social determinants of health. Risk Details: From facility         Final Clinical Impression(s) / ED Diagnoses Final diagnoses:  None    Rx / DC  Orders ED Discharge Orders     None         Rolinda Climes, DO 02/21/24 2359

## 2024-02-21 NOTE — ED Triage Notes (Signed)
 Pt arrived via EMS for nephrostony tube FROM [facility]. Pt current has sacral wound. [id number]  2L 119/65 88 16RR 97HR 2/10 Superpub

## 2024-02-22 ENCOUNTER — Emergency Department (HOSPITAL_COMMUNITY)

## 2024-02-22 DIAGNOSIS — T83022A Displacement of nephrostomy catheter, initial encounter: Secondary | ICD-10-CM | POA: Diagnosis not present

## 2024-02-22 HISTORY — PX: IR NEPHROSTOMY PLACEMENT LEFT: IMG6063

## 2024-02-22 LAB — LACTIC ACID, PLASMA
Lactic Acid, Venous: 1.4 mmol/L (ref 0.5–1.9)
Lactic Acid, Venous: 1.1 mmol/L (ref 0.5–1.9)

## 2024-02-22 LAB — URINALYSIS, ROUTINE W REFLEX MICROSCOPIC
Bilirubin Urine: NEGATIVE
Glucose, UA: NEGATIVE mg/dL
Ketones, ur: NEGATIVE mg/dL
Nitrite: NEGATIVE
Protein, ur: 100 mg/dL — AB
RBC / HPF: >50 RBC/hpf (ref 0–5)
Specific Gravity, Urine: 1.014 (ref 1.005–1.030)
WBC, UA: >50 WBC/hpf (ref 0–5)
pH: 5 (ref 5.0–8.0)

## 2024-02-22 LAB — BASIC METABOLIC PANEL WITH GFR
Anion gap: 11 (ref 5–15)
Anion gap: 6 (ref 5–15)
BUN: 40 mg/dL — ABNORMAL HIGH (ref 8–23)
BUN: 46 mg/dL — ABNORMAL HIGH (ref 8–23)
CO2: 21 mmol/L — ABNORMAL LOW (ref 22–32)
CO2: 22 mmol/L (ref 22–32)
Calcium: 8.7 mg/dL — ABNORMAL LOW (ref 8.9–10.3)
Calcium: 9.9 mg/dL (ref 8.9–10.3)
Chloride: 107 mmol/L (ref 98–111)
Chloride: 111 mmol/L (ref 98–111)
Creatinine, Ser: 1.94 mg/dL — ABNORMAL HIGH (ref 0.61–1.24)
Creatinine, Ser: 2.24 mg/dL — ABNORMAL HIGH (ref 0.61–1.24)
GFR, Estimated: 29 mL/min — ABNORMAL LOW (ref >60)
GFR, Estimated: 34 mL/min — ABNORMAL LOW (ref >60)
Glucose, Bld: 104 mg/dL — ABNORMAL HIGH (ref 70–99)
Glucose, Bld: 111 mg/dL — ABNORMAL HIGH (ref 70–99)
Potassium: 5 mmol/L (ref 3.5–5.1)
Potassium: 5.3 mmol/L — ABNORMAL HIGH (ref 3.5–5.1)
Sodium: 139 mmol/L (ref 135–145)
Sodium: 139 mmol/L (ref 135–145)

## 2024-02-22 LAB — CBC
HCT: 38.4 % — ABNORMAL LOW (ref 39.0–52.0)
Hemoglobin: 11 g/dL — ABNORMAL LOW (ref 13.0–17.0)
MCH: 27.8 pg (ref 26.0–34.0)
MCHC: 28.6 g/dL — ABNORMAL LOW (ref 30.0–36.0)
MCV: 97.2 fL (ref 80.0–100.0)
Platelets: 359 10*3/uL (ref 150–400)
RBC: 3.95 MIL/uL — ABNORMAL LOW (ref 4.22–5.81)
RDW: 17.2 % — ABNORMAL HIGH (ref 11.5–15.5)
WBC: 12.7 10*3/uL — ABNORMAL HIGH (ref 4.0–10.5)
nRBC: 0 % (ref 0.0–0.2)

## 2024-02-22 MED ORDER — FENTANYL CITRATE PF 50 MCG/ML IJ SOSY
50.0000 ug | PREFILLED_SYRINGE | Freq: Once | INTRAMUSCULAR | Status: AC
  Administered 2024-02-22: 50 ug via INTRAVENOUS
  Filled 2024-02-22: qty 1

## 2024-02-22 MED ORDER — OXYCODONE-ACETAMINOPHEN 5-325 MG PO TABS
1.0000 | ORAL_TABLET | Freq: Once | ORAL | Status: AC
  Administered 2024-02-22: 1 via ORAL
  Filled 2024-02-22: qty 1

## 2024-02-22 MED ORDER — CEPHALEXIN 500 MG PO CAPS: 500.0000 mg | ORAL_CAPSULE | Freq: Four times a day (QID) | ORAL | 0 refills | Status: DC

## 2024-02-22 MED ORDER — IOHEXOL 300 MG/ML  SOLN
50.0000 mL | Freq: Once | INTRAMUSCULAR | Status: AC | PRN
  Administered 2024-02-22: 15 mL

## 2024-02-22 MED ORDER — LIDOCAINE HCL 1 % IJ SOLN
INTRAMUSCULAR | Status: AC
  Filled 2024-02-22: qty 20

## 2024-02-22 MED ORDER — SODIUM CHLORIDE 0.9 % IV BOLUS
1000.0000 mL | Freq: Once | INTRAVENOUS | Status: AC
  Administered 2024-02-22: 1000 mL via INTRAVENOUS

## 2024-02-22 NOTE — Procedures (Signed)
 Interventional Radiology Procedure Note  Procedure: Image guided replacement of left PCN, displaced.  New 27F pigtail  Complications: None  EBL: None    Recommendations: - Routine drain care    - routine exchanges  Signed,  Marciano Settles. Mabel Savage, DO

## 2024-02-22 NOTE — ED Provider Notes (Signed)
 Care assumed from Dr. Linder Revere.  Patient with dislodged nephrostomy tube.  Also has suprapubic catheter in place.  No fever or vomiting.  Plan for IR to replace in the morning.  Potential admission discussed with Dr. Ascension Lavender.  He feels patient can wait in the ED for interventional radiology and then be discharged.  Basic labs are obtained.  Urinalysis is positive but will send for culture.  CT scan shows stable 2-3 mm left distal ureter stone.  No hydronephrosis.  Ventral hernia without evidence of obstruction. Leukocytosis of 12.  Creatinine near baseline of 2.2.  Was hospitalized 3 weeks ago with E. coli bacteremia.  Remained stable throughout overnight shift.  Patient pending IR nephrostomy tube placement this morning.  Does have infected looking urinalysis will send for culture.  This is a suprapubic sample however.  Does not appear to be toxic or septic.  Lactate is normal. Does have some mild hyperkalemia overnight.  Will recheck labs this morning after hydration  Dr. Leighton Punches to assume care at shift change.   Earma Gloss, MD 02/22/24 [phone]

## 2024-02-22 NOTE — ED Notes (Signed)
 Pt returned from IR. Nephrostomy tube has been replaced

## 2024-02-22 NOTE — ED Provider Notes (Signed)
 Patient signed to me by Dr. Alison Irvine pending placement of nephrostomy tube by IR which was completed.  Morning labs reviewed and patient's potassium is stable.  Creatinine is stable as well 2.  Given Percocet for pain.  Urinalysis noted and culture sent.  Will place patient on Keflex  and discharged back to facility   Lind Repine, MD 02/22/24 1116

## 2024-02-23 ENCOUNTER — Ambulatory Visit

## 2024-02-23 DIAGNOSIS — I679 Cerebrovascular disease, unspecified: Secondary | ICD-10-CM | POA: Diagnosis not present

## 2024-02-23 DIAGNOSIS — J8410 Pulmonary fibrosis, unspecified: Secondary | ICD-10-CM | POA: Diagnosis not present

## 2024-02-23 DIAGNOSIS — M6281 Muscle weakness (generalized): Secondary | ICD-10-CM | POA: Diagnosis not present

## 2024-02-23 DIAGNOSIS — R278 Other lack of coordination: Secondary | ICD-10-CM | POA: Diagnosis not present

## 2024-02-23 DIAGNOSIS — F419 Anxiety disorder, unspecified: Secondary | ICD-10-CM | POA: Diagnosis not present

## 2024-02-23 DIAGNOSIS — J189 Pneumonia, unspecified organism: Secondary | ICD-10-CM | POA: Diagnosis not present

## 2024-02-23 DIAGNOSIS — E440 Moderate protein-calorie malnutrition: Secondary | ICD-10-CM | POA: Diagnosis not present

## 2024-02-23 DIAGNOSIS — M4628 Osteomyelitis of vertebra, sacral and sacrococcygeal region: Secondary | ICD-10-CM | POA: Diagnosis not present

## 2024-02-23 DIAGNOSIS — J449 Chronic obstructive pulmonary disease, unspecified: Secondary | ICD-10-CM | POA: Diagnosis not present

## 2024-02-23 DIAGNOSIS — L89154 Pressure ulcer of sacral region, stage 4: Secondary | ICD-10-CM | POA: Diagnosis not present

## 2024-02-23 DIAGNOSIS — J439 Emphysema, unspecified: Secondary | ICD-10-CM | POA: Diagnosis not present

## 2024-02-23 DIAGNOSIS — R1312 Dysphagia, oropharyngeal phase: Secondary | ICD-10-CM | POA: Diagnosis not present

## 2024-02-23 DIAGNOSIS — G35 Multiple sclerosis: Secondary | ICD-10-CM | POA: Diagnosis not present

## 2024-02-23 LAB — URINE CULTURE: Culture: >=100000 — AB

## 2024-02-24 ENCOUNTER — Other Ambulatory Visit (HOSPITAL_COMMUNITY)

## 2024-02-24 ENCOUNTER — Telehealth (HOSPITAL_BASED_OUTPATIENT_CLINIC_OR_DEPARTMENT_OTHER): Payer: Self-pay | Admitting: *Deleted

## 2024-02-24 DIAGNOSIS — R159 Full incontinence of feces: Secondary | ICD-10-CM | POA: Diagnosis not present

## 2024-02-24 DIAGNOSIS — M6281 Muscle weakness (generalized): Secondary | ICD-10-CM | POA: Diagnosis not present

## 2024-02-24 DIAGNOSIS — G894 Chronic pain syndrome: Secondary | ICD-10-CM | POA: Diagnosis not present

## 2024-02-24 DIAGNOSIS — G35 Multiple sclerosis: Secondary | ICD-10-CM | POA: Diagnosis not present

## 2024-02-24 DIAGNOSIS — L89620 Pressure ulcer of left heel, unstageable: Secondary | ICD-10-CM | POA: Diagnosis not present

## 2024-02-24 DIAGNOSIS — L89154 Pressure ulcer of sacral region, stage 4: Secondary | ICD-10-CM | POA: Diagnosis not present

## 2024-02-24 DIAGNOSIS — L89616 Pressure-induced deep tissue damage of right heel: Secondary | ICD-10-CM | POA: Diagnosis not present

## 2024-02-24 DIAGNOSIS — E440 Moderate protein-calorie malnutrition: Secondary | ICD-10-CM | POA: Diagnosis not present

## 2024-02-24 DIAGNOSIS — L89510 Pressure ulcer of right ankle, unstageable: Secondary | ICD-10-CM | POA: Diagnosis not present

## 2024-02-24 DIAGNOSIS — L89314 Pressure ulcer of right buttock, stage 4: Secondary | ICD-10-CM | POA: Diagnosis not present

## 2024-02-24 DIAGNOSIS — F419 Anxiety disorder, unspecified: Secondary | ICD-10-CM | POA: Diagnosis not present

## 2024-02-24 DIAGNOSIS — L89320 Pressure ulcer of left buttock, unstageable: Secondary | ICD-10-CM | POA: Diagnosis not present

## 2024-02-24 DIAGNOSIS — M4628 Osteomyelitis of vertebra, sacral and sacrococcygeal region: Secondary | ICD-10-CM | POA: Diagnosis not present

## 2024-02-24 DIAGNOSIS — L89896 Pressure-induced deep tissue damage of other site: Secondary | ICD-10-CM | POA: Diagnosis not present

## 2024-02-24 NOTE — Telephone Encounter (Signed)
 Post ED Visit - Positive Culture Follow-up: Successful Patient Follow-Up  Culture assessed and recommendations reviewed by:  []  [place], Pharm.D. []  Skeet Duke, Pharm.D., BCPS AQ-ID []  Leslee Rase, Pharm.D., BCPS [x]  Garland Junk, Pharm.D., BCPS []  [place], [address].D., BCPS, AAHIVP []  Alcide Aly, Pharm.D., BCPS, AAHIVP []  Jerri Morale, PharmD, BCPS []  Graham Laws, PharmD, BCPS []  Cleda Curly, PharmD, BCPS []  Tamar Fairly, PharmD  Positive urine culture (yeast)  []  Patient discharged without antimicrobial prescription and treatment is now indicated []  Organism is resistant to prescribed ED discharge antimicrobial [x]  Patient discharged on antibiotic, but no additional antibiotics are needed  Changes discussed with ED provider: Mozell Arias Plan: stop cephalexin ; no additional antibiotics needed. Contacted patient's nurse, Nohemi Batters, at [facility] in [place], [place] and notified her of the plan, date 02/24/24, time 11:15   Zeb Heys 02/24/2024, 11:15 AM

## 2024-02-24 NOTE — Progress Notes (Signed)
 ED Antimicrobial Stewardship Positive Culture Follow Up   Jordan Ward is an 83 y.o. male who presented to [facility] on 02/21/2024 with a chief complaint of  Chief Complaint  Patient presents with   Flank Pain    Nephrostomy tube     Recent Results (from the past 720 hours)  Urine Culture     Status: Abnormal   Collection Time: 01/30/24  9:24 AM   Specimen: Urine, Random  Result Value Ref Range Status   Specimen Description   Final    URINE, RANDOM Performed at [facility], [address], [place], [place] [address]    Special Requests   Final    NONE Reflexed from [id number] Performed at [facility], [address] [place], [place] [address]    Culture MULTIPLE SPECIES PRESENT, SUGGEST RECOLLECTION (A)  Final   Report Status 01/31/2024 FINAL  Final  Blood Culture (routine x 2)     Status: Abnormal   Collection Time: 01/30/24  9:51 AM   Specimen: BLOOD RIGHT WRIST  Result Value Ref Range Status   Specimen Description   Final    BLOOD RIGHT WRIST BOTTLES DRAWN AEROBIC AND ANAEROBIC Performed at [facility], [address], [place], [place] [address]    Special Requests   Final    Blood Culture adequate volume Performed at [facility], [address] [place], [place] [address]    Culture  Setup Time   Final    GRAM NEGATIVE RODS IN BOTH AEROBIC AND ANAEROBIC BOTTLES CRITICAL RESULT CALLED TO, READ BACK BY AND VERIFIED WITH: EVANS,T ON 01/30/24 AT 2217 BY PURDIE,J CRITICAL RESULT CALLED TO, READ BACK BY AND VERIFIED WITH: Twanna Galas RN 01/31/2024 @ 0244 BY AB Performed at [facility], [address], [place], [place] [address]    Culture ESCHERICHIA COLI (A)  Final   Report Status 02/02/2024 FINAL  Final   Organism ID, Bacteria ESCHERICHIA COLI  Final   Organism ID, Bacteria ESCHERICHIA COLI  Final      Susceptibility   Escherichia coli - KIRBY BAUER*    CEFAZOLIN  INTERMEDIATE Intermediate    Escherichia coli - MIC*    AMPICILLIN  >=32 RESISTANT Resistant     CEFEPIME   <=0.12 SENSITIVE Sensitive     CEFTAZIDIME  <=1 SENSITIVE Sensitive     CEFTRIAXONE  <=0.25 SENSITIVE Sensitive     CIPROFLOXACIN  <=0.25 SENSITIVE Sensitive     GENTAMICIN  <=1 SENSITIVE Sensitive     IMIPENEM  <=0.25 SENSITIVE Sensitive     TRIMETH /SULFA  >=320 RESISTANT Resistant     AMPICILLIN /SULBACTAM 16 INTERMEDIATE Intermediate     PIP/TAZO <=4 SENSITIVE Sensitive ug/mL    * ESCHERICHIA COLI    ESCHERICHIA COLI  Blood Culture (routine x 2)     Status: Abnormal   Collection Time: 01/30/24  9:51 AM   Specimen: BLOOD LEFT ARM  Result Value Ref Range Status   Specimen Description   Final    BLOOD LEFT ARM BOTTLES DRAWN AEROBIC AND ANAEROBIC Performed at [facility], [address] [place], [place] [address]    Special Requests   Final    Blood Culture results may not be optimal due to an inadequate volume of blood received in culture bottles Performed at [facility], [address], [place], [place] [address]    Culture  Setup Time   Final    GRAM NEGATIVE RODS IN BOTH AEROBIC AND ANAEROBIC BOTTLES CRITICAL RESULT CALLED TO, READ BACK BY AND VERIFIED WITH: EVANS,T ON 01/30/24 AT  2217 BY PURDIE,J Performed at [facility], [address], [place], [place] [address]    Culture (A)  Final    ESCHERICHIA COLI SUSCEPTIBILITIES PERFORMED ON PREVIOUS CULTURE WITHIN THE LAST 5 DAYS. Performed at [facility], [address], [place], [place] [address]    Report Status 02/02/2024 FINAL  Final  Blood Culture ID Panel (Reflexed)     Status: Abnormal   Collection Time: 01/30/24  9:51 AM  Result Value Ref Range Status   Enterococcus faecalis NOT DETECTED NOT DETECTED Final   Enterococcus Faecium NOT DETECTED NOT DETECTED Final   Listeria monocytogenes NOT DETECTED NOT DETECTED Final   Staphylococcus species NOT DETECTED NOT DETECTED Final   Staphylococcus aureus (BCID) NOT DETECTED NOT DETECTED Final   Staphylococcus epidermidis NOT DETECTED NOT DETECTED Final   Staphylococcus  lugdunensis NOT DETECTED NOT DETECTED Final   Streptococcus species NOT DETECTED NOT DETECTED Final   Streptococcus agalactiae NOT DETECTED NOT DETECTED Final   Streptococcus pneumoniae NOT DETECTED NOT DETECTED Final   Streptococcus pyogenes NOT DETECTED NOT DETECTED Final   A.calcoaceticus-baumannii NOT DETECTED NOT DETECTED Final   Bacteroides fragilis NOT DETECTED NOT DETECTED Final   Enterobacterales DETECTED (A) NOT DETECTED Final    Comment: Enterobacterales represent a large order of gram negative bacteria, not a single organism. CRITICAL RESULT CALLED TO, READ BACK BY AND VERIFIED WITH: S WADE RN 01/31/2024 @ 0244 BY AB    Enterobacter cloacae complex NOT DETECTED NOT DETECTED Final   Escherichia coli DETECTED (A) NOT DETECTED Final    Comment: CRITICAL RESULT CALLED TO, READ BACK BY AND VERIFIED WITH: S WADE RN 01/31/2024 @ 0244 BY AB    Klebsiella aerogenes NOT DETECTED NOT DETECTED Final   Klebsiella oxytoca NOT DETECTED NOT DETECTED Final   Klebsiella pneumoniae NOT DETECTED NOT DETECTED Final   Proteus species NOT DETECTED NOT DETECTED Final   Salmonella species NOT DETECTED NOT DETECTED Final   Serratia marcescens NOT DETECTED NOT DETECTED Final   Haemophilus influenzae NOT DETECTED NOT DETECTED Final   Neisseria meningitidis NOT DETECTED NOT DETECTED Final   Pseudomonas aeruginosa NOT DETECTED NOT DETECTED Final   Stenotrophomonas maltophilia NOT DETECTED NOT DETECTED Final   Candida albicans NOT DETECTED NOT DETECTED Final   Candida auris NOT DETECTED NOT DETECTED Final   Candida glabrata NOT DETECTED NOT DETECTED Final   Candida krusei NOT DETECTED NOT DETECTED Final   Candida parapsilosis NOT DETECTED NOT DETECTED Final   Candida tropicalis NOT DETECTED NOT DETECTED Final   Cryptococcus neoformans/gattii NOT DETECTED NOT DETECTED Final   CTX-M ESBL NOT DETECTED NOT DETECTED Final   Carbapenem resistance IMP NOT DETECTED NOT DETECTED Final   Carbapenem resistance KPC  NOT DETECTED NOT DETECTED Final   Carbapenem resistance NDM NOT DETECTED NOT DETECTED Final   Carbapenem resist OXA 48 LIKE NOT DETECTED NOT DETECTED Final   Carbapenem resistance VIM NOT DETECTED NOT DETECTED Final    Comment: Performed at [facility], [address], [place], [place] [address]  MRSA Next Gen by PCR, Nasal     Status: Abnormal   Collection Time: 01/30/24  3:23 PM   Specimen: Nasal Mucosa; Nasal Swab  Result Value Ref Range Status   MRSA by PCR Next Gen DETECTED (A) NOT DETECTED Final    Comment: RESULT CALLED TO, READ BACK BY AND VERIFIED WITH: J BERES AT 1829 ON [id number] BY S DALTON (NOTE) The GeneXpert MRSA Assay (FDA approved for NASAL specimens only), is  one component of a comprehensive MRSA colonization surveillance program. It is not intended to diagnose MRSA infection nor to guide or monitor treatment for MRSA infections. Test performance is not FDA approved in patients less than 35 years old. Performed at [facility], [address], [place], [place] [address]   Urine Culture (for pregnant, neutropenic or urologic patients or patients with an indwelling urinary catheter)     Status: Abnormal   Collection Time: 02/22/24 12:32 AM   Specimen: Urine, Clean Catch  Result Value Ref Range Status   Specimen Description   Final    URINE, CLEAN CATCH Performed at [facility], [address], [place], [place] [address]    Special Requests   Final    NONE Performed at [facility], [address], [place], [place] [address]    Culture >=100,000 COLONIES/mL YEAST (A)  Final   Report Status 02/23/2024 FINAL  Final  Blood culture (routine x 2)     Status: None (Preliminary result)   Collection Time: 02/22/24  1:53 AM   Specimen: BLOOD LEFT FOREARM  Result Value Ref Range Status   Specimen Description   Final    BLOOD LEFT FOREARM Performed at [facility], [address], [place], [place] [address]    Special  Requests   Final    BOTTLES DRAWN AEROBIC AND ANAEROBIC Blood Culture results may not be optimal due to an inadequate volume of blood received in culture bottles Performed at [facility], [address] [place], [place] [address]    Culture   Final    NO GROWTH 2 DAYS Performed at [facility], [address], [place], [place] [address]    Report Status PENDING  Incomplete  Blood culture (routine x 2)     Status: None (Preliminary result)   Collection Time: 02/22/24  2:03 AM   Specimen: BLOOD  Result Value Ref Range Status   Specimen Description   Final    BLOOD BLOOD LEFT HAND Performed at [facility], [address] [place], [place] [address]    Special Requests   Final    BOTTLES DRAWN AEROBIC AND ANAEROBIC Blood Culture results may not be optimal due to an inadequate volume of blood received in culture bottles Performed at [facility], [address], [place], [place] [address]    Culture   Final    NO GROWTH 2 DAYS Performed at [facility], [address] [place], [place] [address]    Report Status PENDING  Incomplete    [x]  Treated with cephalexin , no treatment necessary.   77 YOM presented to ED with dislodged nephrostomy tube. Patient reported rolling over, and dislodging tube 30 minutes prior to arrival. Patient reported mild discomfort around insertion site, but no complaints of fever, chills, abdominal pain, nausea, vomiting, or diarrhea. Visual inspection of insertion site was not suspicious for infection. UA was suspicious for infection, although sample was drawn from suprapubic catheter. Patient had nephrostomy tube replaced and was discharged with cephalexin  for UTI. Urine culture grew yeast which is likely reflective of colonization, no treatment necessary.  Call patient to discontinue cephalexin . No treatment necessary.   ED Provider: Mozell Arias, MD  Winnie Haver, PharmD Candidate  [phone] [facility] 02/24/2024 10:25 AM

## 2024-02-25 DIAGNOSIS — I639 Cerebral infarction, unspecified: Secondary | ICD-10-CM | POA: Diagnosis not present

## 2024-02-25 DIAGNOSIS — I6310 Cerebral infarction due to embolism of unspecified precerebral artery: Secondary | ICD-10-CM | POA: Diagnosis not present

## 2024-02-27 LAB — CULTURE, BLOOD (ROUTINE X 2)
Culture: NO GROWTH
Culture: NO GROWTH

## 2024-02-29 ENCOUNTER — Ambulatory Visit (INDEPENDENT_AMBULATORY_CARE_PROVIDER_SITE_OTHER)

## 2024-02-29 DIAGNOSIS — R1312 Dysphagia, oropharyngeal phase: Secondary | ICD-10-CM | POA: Diagnosis not present

## 2024-02-29 DIAGNOSIS — J189 Pneumonia, unspecified organism: Secondary | ICD-10-CM | POA: Diagnosis not present

## 2024-02-29 DIAGNOSIS — R339 Retention of urine, unspecified: Secondary | ICD-10-CM

## 2024-02-29 DIAGNOSIS — M4628 Osteomyelitis of vertebra, sacral and sacrococcygeal region: Secondary | ICD-10-CM | POA: Diagnosis not present

## 2024-02-29 DIAGNOSIS — Z9359 Other cystostomy status: Secondary | ICD-10-CM

## 2024-02-29 DIAGNOSIS — G35 Multiple sclerosis: Secondary | ICD-10-CM | POA: Diagnosis not present

## 2024-02-29 DIAGNOSIS — R278 Other lack of coordination: Secondary | ICD-10-CM | POA: Diagnosis not present

## 2024-02-29 DIAGNOSIS — M6281 Muscle weakness (generalized): Secondary | ICD-10-CM | POA: Diagnosis not present

## 2024-02-29 NOTE — Progress Notes (Signed)
 Suprapubic Cath Change  Patient is present today for a suprapubic catheter change due to urinary retention.  10ml of water  was drained from the balloon, a 24FR foley cath was removed from the tract with out difficulty.  Suprapubic catheter site was cleaned and prepped in a sterile fashion with Betadinex3  A 24FR foley cath was replaced into the tract no complications were noted. Urine return was noted, Flush Clear yellow in color . 10 ml of sterile water  was inflated into the balloon and a night bag was attached for drainage.  Patient tolerated well. A night bag was given to patient and proper instruction was given on how to switch bags.    Performed by: Gorden Latino, CMA  Follow up: 4 weeks

## 2024-03-01 ENCOUNTER — Encounter: Admitting: Vascular Surgery

## 2024-03-01 DIAGNOSIS — R278 Other lack of coordination: Secondary | ICD-10-CM | POA: Diagnosis not present

## 2024-03-01 DIAGNOSIS — R1312 Dysphagia, oropharyngeal phase: Secondary | ICD-10-CM | POA: Diagnosis not present

## 2024-03-01 DIAGNOSIS — M4628 Osteomyelitis of vertebra, sacral and sacrococcygeal region: Secondary | ICD-10-CM | POA: Diagnosis not present

## 2024-03-01 DIAGNOSIS — M6281 Muscle weakness (generalized): Secondary | ICD-10-CM | POA: Diagnosis not present

## 2024-03-01 DIAGNOSIS — G35 Multiple sclerosis: Secondary | ICD-10-CM | POA: Diagnosis not present

## 2024-03-01 DIAGNOSIS — J189 Pneumonia, unspecified organism: Secondary | ICD-10-CM | POA: Diagnosis not present

## 2024-03-02 DIAGNOSIS — L89314 Pressure ulcer of right buttock, stage 4: Secondary | ICD-10-CM | POA: Diagnosis not present

## 2024-03-02 DIAGNOSIS — R159 Full incontinence of feces: Secondary | ICD-10-CM | POA: Diagnosis not present

## 2024-03-02 DIAGNOSIS — R1312 Dysphagia, oropharyngeal phase: Secondary | ICD-10-CM | POA: Diagnosis not present

## 2024-03-02 DIAGNOSIS — E440 Moderate protein-calorie malnutrition: Secondary | ICD-10-CM | POA: Diagnosis not present

## 2024-03-02 DIAGNOSIS — M6281 Muscle weakness (generalized): Secondary | ICD-10-CM | POA: Diagnosis not present

## 2024-03-02 DIAGNOSIS — L89616 Pressure-induced deep tissue damage of right heel: Secondary | ICD-10-CM | POA: Diagnosis not present

## 2024-03-02 DIAGNOSIS — L89510 Pressure ulcer of right ankle, unstageable: Secondary | ICD-10-CM | POA: Diagnosis not present

## 2024-03-02 DIAGNOSIS — G894 Chronic pain syndrome: Secondary | ICD-10-CM | POA: Diagnosis not present

## 2024-03-02 DIAGNOSIS — L89896 Pressure-induced deep tissue damage of other site: Secondary | ICD-10-CM | POA: Diagnosis not present

## 2024-03-02 DIAGNOSIS — G35 Multiple sclerosis: Secondary | ICD-10-CM | POA: Diagnosis not present

## 2024-03-02 DIAGNOSIS — M4628 Osteomyelitis of vertebra, sacral and sacrococcygeal region: Secondary | ICD-10-CM | POA: Diagnosis not present

## 2024-03-02 DIAGNOSIS — R278 Other lack of coordination: Secondary | ICD-10-CM | POA: Diagnosis not present

## 2024-03-02 DIAGNOSIS — L89320 Pressure ulcer of left buttock, unstageable: Secondary | ICD-10-CM | POA: Diagnosis not present

## 2024-03-02 DIAGNOSIS — L89154 Pressure ulcer of sacral region, stage 4: Secondary | ICD-10-CM | POA: Diagnosis not present

## 2024-03-02 DIAGNOSIS — J189 Pneumonia, unspecified organism: Secondary | ICD-10-CM | POA: Diagnosis not present

## 2024-03-02 DIAGNOSIS — L89620 Pressure ulcer of left heel, unstageable: Secondary | ICD-10-CM | POA: Diagnosis not present

## 2024-03-03 DIAGNOSIS — J189 Pneumonia, unspecified organism: Secondary | ICD-10-CM | POA: Diagnosis not present

## 2024-03-03 DIAGNOSIS — G35 Multiple sclerosis: Secondary | ICD-10-CM | POA: Diagnosis not present

## 2024-03-03 DIAGNOSIS — E7800 Pure hypercholesterolemia, unspecified: Secondary | ICD-10-CM | POA: Diagnosis not present

## 2024-03-03 DIAGNOSIS — M4628 Osteomyelitis of vertebra, sacral and sacrococcygeal region: Secondary | ICD-10-CM | POA: Diagnosis not present

## 2024-03-03 DIAGNOSIS — F5102 Adjustment insomnia: Secondary | ICD-10-CM | POA: Diagnosis not present

## 2024-03-03 DIAGNOSIS — R1312 Dysphagia, oropharyngeal phase: Secondary | ICD-10-CM | POA: Diagnosis not present

## 2024-03-03 DIAGNOSIS — F419 Anxiety disorder, unspecified: Secondary | ICD-10-CM | POA: Diagnosis not present

## 2024-03-03 DIAGNOSIS — F32A Depression, unspecified: Secondary | ICD-10-CM | POA: Diagnosis not present

## 2024-03-03 DIAGNOSIS — M6281 Muscle weakness (generalized): Secondary | ICD-10-CM | POA: Diagnosis not present

## 2024-03-03 DIAGNOSIS — D649 Anemia, unspecified: Secondary | ICD-10-CM | POA: Diagnosis not present

## 2024-03-03 DIAGNOSIS — R278 Other lack of coordination: Secondary | ICD-10-CM | POA: Diagnosis not present

## 2024-03-04 DIAGNOSIS — J189 Pneumonia, unspecified organism: Secondary | ICD-10-CM | POA: Diagnosis not present

## 2024-03-04 DIAGNOSIS — M6281 Muscle weakness (generalized): Secondary | ICD-10-CM | POA: Diagnosis not present

## 2024-03-04 DIAGNOSIS — G35 Multiple sclerosis: Secondary | ICD-10-CM | POA: Diagnosis not present

## 2024-03-04 DIAGNOSIS — M4628 Osteomyelitis of vertebra, sacral and sacrococcygeal region: Secondary | ICD-10-CM | POA: Diagnosis not present

## 2024-03-04 DIAGNOSIS — R1312 Dysphagia, oropharyngeal phase: Secondary | ICD-10-CM | POA: Diagnosis not present

## 2024-03-04 DIAGNOSIS — L8944 Pressure ulcer of contiguous site of back, buttock and hip, stage 4: Secondary | ICD-10-CM | POA: Diagnosis not present

## 2024-03-04 DIAGNOSIS — I739 Peripheral vascular disease, unspecified: Secondary | ICD-10-CM | POA: Diagnosis not present

## 2024-03-04 DIAGNOSIS — R278 Other lack of coordination: Secondary | ICD-10-CM | POA: Diagnosis not present

## 2024-03-07 DIAGNOSIS — D649 Anemia, unspecified: Secondary | ICD-10-CM | POA: Diagnosis not present

## 2024-03-07 DIAGNOSIS — R278 Other lack of coordination: Secondary | ICD-10-CM | POA: Diagnosis not present

## 2024-03-07 DIAGNOSIS — R1312 Dysphagia, oropharyngeal phase: Secondary | ICD-10-CM | POA: Diagnosis not present

## 2024-03-07 DIAGNOSIS — E785 Hyperlipidemia, unspecified: Secondary | ICD-10-CM | POA: Diagnosis not present

## 2024-03-07 DIAGNOSIS — E440 Moderate protein-calorie malnutrition: Secondary | ICD-10-CM | POA: Diagnosis not present

## 2024-03-07 DIAGNOSIS — M6281 Muscle weakness (generalized): Secondary | ICD-10-CM | POA: Diagnosis not present

## 2024-03-07 DIAGNOSIS — G35 Multiple sclerosis: Secondary | ICD-10-CM | POA: Diagnosis not present

## 2024-03-07 DIAGNOSIS — J189 Pneumonia, unspecified organism: Secondary | ICD-10-CM | POA: Diagnosis not present

## 2024-03-07 DIAGNOSIS — M4628 Osteomyelitis of vertebra, sacral and sacrococcygeal region: Secondary | ICD-10-CM | POA: Diagnosis not present

## 2024-03-07 DIAGNOSIS — E7800 Pure hypercholesterolemia, unspecified: Secondary | ICD-10-CM | POA: Diagnosis not present

## 2024-03-08 DIAGNOSIS — E440 Moderate protein-calorie malnutrition: Secondary | ICD-10-CM | POA: Diagnosis not present

## 2024-03-08 DIAGNOSIS — M4628 Osteomyelitis of vertebra, sacral and sacrococcygeal region: Secondary | ICD-10-CM | POA: Diagnosis not present

## 2024-03-08 DIAGNOSIS — L89154 Pressure ulcer of sacral region, stage 4: Secondary | ICD-10-CM | POA: Diagnosis not present

## 2024-03-08 DIAGNOSIS — G35 Multiple sclerosis: Secondary | ICD-10-CM | POA: Diagnosis not present

## 2024-03-08 DIAGNOSIS — M6281 Muscle weakness (generalized): Secondary | ICD-10-CM | POA: Diagnosis not present

## 2024-03-08 DIAGNOSIS — R278 Other lack of coordination: Secondary | ICD-10-CM | POA: Diagnosis not present

## 2024-03-08 DIAGNOSIS — J189 Pneumonia, unspecified organism: Secondary | ICD-10-CM | POA: Diagnosis not present

## 2024-03-08 DIAGNOSIS — R1312 Dysphagia, oropharyngeal phase: Secondary | ICD-10-CM | POA: Diagnosis not present

## 2024-03-09 DIAGNOSIS — R1312 Dysphagia, oropharyngeal phase: Secondary | ICD-10-CM | POA: Diagnosis not present

## 2024-03-09 DIAGNOSIS — R159 Full incontinence of feces: Secondary | ICD-10-CM | POA: Diagnosis not present

## 2024-03-09 DIAGNOSIS — L89314 Pressure ulcer of right buttock, stage 4: Secondary | ICD-10-CM | POA: Diagnosis not present

## 2024-03-09 DIAGNOSIS — L89896 Pressure-induced deep tissue damage of other site: Secondary | ICD-10-CM | POA: Diagnosis not present

## 2024-03-09 DIAGNOSIS — L89154 Pressure ulcer of sacral region, stage 4: Secondary | ICD-10-CM | POA: Diagnosis not present

## 2024-03-09 DIAGNOSIS — L89320 Pressure ulcer of left buttock, unstageable: Secondary | ICD-10-CM | POA: Diagnosis not present

## 2024-03-09 DIAGNOSIS — J189 Pneumonia, unspecified organism: Secondary | ICD-10-CM | POA: Diagnosis not present

## 2024-03-09 DIAGNOSIS — G894 Chronic pain syndrome: Secondary | ICD-10-CM | POA: Diagnosis not present

## 2024-03-09 DIAGNOSIS — G35 Multiple sclerosis: Secondary | ICD-10-CM | POA: Diagnosis not present

## 2024-03-09 DIAGNOSIS — L89616 Pressure-induced deep tissue damage of right heel: Secondary | ICD-10-CM | POA: Diagnosis not present

## 2024-03-09 DIAGNOSIS — L89620 Pressure ulcer of left heel, unstageable: Secondary | ICD-10-CM | POA: Diagnosis not present

## 2024-03-09 DIAGNOSIS — L89510 Pressure ulcer of right ankle, unstageable: Secondary | ICD-10-CM | POA: Diagnosis not present

## 2024-03-09 DIAGNOSIS — R278 Other lack of coordination: Secondary | ICD-10-CM | POA: Diagnosis not present

## 2024-03-09 DIAGNOSIS — M6281 Muscle weakness (generalized): Secondary | ICD-10-CM | POA: Diagnosis not present

## 2024-03-09 DIAGNOSIS — E440 Moderate protein-calorie malnutrition: Secondary | ICD-10-CM | POA: Diagnosis not present

## 2024-03-09 DIAGNOSIS — M4628 Osteomyelitis of vertebra, sacral and sacrococcygeal region: Secondary | ICD-10-CM | POA: Diagnosis not present

## 2024-03-10 DIAGNOSIS — G35 Multiple sclerosis: Secondary | ICD-10-CM | POA: Diagnosis not present

## 2024-03-10 DIAGNOSIS — M6281 Muscle weakness (generalized): Secondary | ICD-10-CM | POA: Diagnosis not present

## 2024-03-10 DIAGNOSIS — M4628 Osteomyelitis of vertebra, sacral and sacrococcygeal region: Secondary | ICD-10-CM | POA: Diagnosis not present

## 2024-03-10 DIAGNOSIS — R278 Other lack of coordination: Secondary | ICD-10-CM | POA: Diagnosis not present

## 2024-03-10 DIAGNOSIS — R1312 Dysphagia, oropharyngeal phase: Secondary | ICD-10-CM | POA: Diagnosis not present

## 2024-03-10 DIAGNOSIS — J189 Pneumonia, unspecified organism: Secondary | ICD-10-CM | POA: Diagnosis not present

## 2024-03-11 DIAGNOSIS — M6281 Muscle weakness (generalized): Secondary | ICD-10-CM | POA: Diagnosis not present

## 2024-03-11 DIAGNOSIS — R1312 Dysphagia, oropharyngeal phase: Secondary | ICD-10-CM | POA: Diagnosis not present

## 2024-03-11 DIAGNOSIS — G35 Multiple sclerosis: Secondary | ICD-10-CM | POA: Diagnosis not present

## 2024-03-11 DIAGNOSIS — J189 Pneumonia, unspecified organism: Secondary | ICD-10-CM | POA: Diagnosis not present

## 2024-03-11 DIAGNOSIS — M4628 Osteomyelitis of vertebra, sacral and sacrococcygeal region: Secondary | ICD-10-CM | POA: Diagnosis not present

## 2024-03-11 DIAGNOSIS — R278 Other lack of coordination: Secondary | ICD-10-CM | POA: Diagnosis not present

## 2024-03-12 DIAGNOSIS — M6281 Muscle weakness (generalized): Secondary | ICD-10-CM | POA: Diagnosis not present

## 2024-03-12 DIAGNOSIS — M4628 Osteomyelitis of vertebra, sacral and sacrococcygeal region: Secondary | ICD-10-CM | POA: Diagnosis not present

## 2024-03-12 DIAGNOSIS — J189 Pneumonia, unspecified organism: Secondary | ICD-10-CM | POA: Diagnosis not present

## 2024-03-12 DIAGNOSIS — R1312 Dysphagia, oropharyngeal phase: Secondary | ICD-10-CM | POA: Diagnosis not present

## 2024-03-12 DIAGNOSIS — R278 Other lack of coordination: Secondary | ICD-10-CM | POA: Diagnosis not present

## 2024-03-12 DIAGNOSIS — G35 Multiple sclerosis: Secondary | ICD-10-CM | POA: Diagnosis not present

## 2024-03-14 DIAGNOSIS — R1312 Dysphagia, oropharyngeal phase: Secondary | ICD-10-CM | POA: Diagnosis not present

## 2024-03-14 DIAGNOSIS — M4628 Osteomyelitis of vertebra, sacral and sacrococcygeal region: Secondary | ICD-10-CM | POA: Diagnosis not present

## 2024-03-14 DIAGNOSIS — M6281 Muscle weakness (generalized): Secondary | ICD-10-CM | POA: Diagnosis not present

## 2024-03-14 DIAGNOSIS — G35 Multiple sclerosis: Secondary | ICD-10-CM | POA: Diagnosis not present

## 2024-03-14 DIAGNOSIS — R278 Other lack of coordination: Secondary | ICD-10-CM | POA: Diagnosis not present

## 2024-03-14 DIAGNOSIS — J189 Pneumonia, unspecified organism: Secondary | ICD-10-CM | POA: Diagnosis not present

## 2024-03-15 DIAGNOSIS — R278 Other lack of coordination: Secondary | ICD-10-CM | POA: Diagnosis not present

## 2024-03-15 DIAGNOSIS — R1312 Dysphagia, oropharyngeal phase: Secondary | ICD-10-CM | POA: Diagnosis not present

## 2024-03-15 DIAGNOSIS — M6281 Muscle weakness (generalized): Secondary | ICD-10-CM | POA: Diagnosis not present

## 2024-03-15 DIAGNOSIS — G35 Multiple sclerosis: Secondary | ICD-10-CM | POA: Diagnosis not present

## 2024-03-15 DIAGNOSIS — Z9359 Other cystostomy status: Secondary | ICD-10-CM | POA: Diagnosis not present

## 2024-03-15 DIAGNOSIS — J449 Chronic obstructive pulmonary disease, unspecified: Secondary | ICD-10-CM | POA: Diagnosis not present

## 2024-03-15 DIAGNOSIS — M4628 Osteomyelitis of vertebra, sacral and sacrococcygeal region: Secondary | ICD-10-CM | POA: Diagnosis not present

## 2024-03-15 DIAGNOSIS — J439 Emphysema, unspecified: Secondary | ICD-10-CM | POA: Diagnosis not present

## 2024-03-15 DIAGNOSIS — J8410 Pulmonary fibrosis, unspecified: Secondary | ICD-10-CM | POA: Diagnosis not present

## 2024-03-15 DIAGNOSIS — I679 Cerebrovascular disease, unspecified: Secondary | ICD-10-CM | POA: Diagnosis not present

## 2024-03-15 DIAGNOSIS — E440 Moderate protein-calorie malnutrition: Secondary | ICD-10-CM | POA: Diagnosis not present

## 2024-03-15 DIAGNOSIS — J189 Pneumonia, unspecified organism: Secondary | ICD-10-CM | POA: Diagnosis not present

## 2024-03-15 DIAGNOSIS — L89154 Pressure ulcer of sacral region, stage 4: Secondary | ICD-10-CM | POA: Diagnosis not present

## 2024-03-16 ENCOUNTER — Ambulatory Visit: Attending: Neurology

## 2024-03-16 DIAGNOSIS — L89154 Pressure ulcer of sacral region, stage 4: Secondary | ICD-10-CM | POA: Diagnosis not present

## 2024-03-16 DIAGNOSIS — I639 Cerebral infarction, unspecified: Secondary | ICD-10-CM

## 2024-03-16 DIAGNOSIS — R1312 Dysphagia, oropharyngeal phase: Secondary | ICD-10-CM | POA: Diagnosis not present

## 2024-03-16 DIAGNOSIS — L89896 Pressure-induced deep tissue damage of other site: Secondary | ICD-10-CM | POA: Diagnosis not present

## 2024-03-16 DIAGNOSIS — L89320 Pressure ulcer of left buttock, unstageable: Secondary | ICD-10-CM | POA: Diagnosis not present

## 2024-03-16 DIAGNOSIS — L89510 Pressure ulcer of right ankle, unstageable: Secondary | ICD-10-CM | POA: Diagnosis not present

## 2024-03-16 DIAGNOSIS — G894 Chronic pain syndrome: Secondary | ICD-10-CM | POA: Diagnosis not present

## 2024-03-16 DIAGNOSIS — I6310 Cerebral infarction due to embolism of unspecified precerebral artery: Secondary | ICD-10-CM

## 2024-03-16 DIAGNOSIS — R159 Full incontinence of feces: Secondary | ICD-10-CM | POA: Diagnosis not present

## 2024-03-16 DIAGNOSIS — L89314 Pressure ulcer of right buttock, stage 4: Secondary | ICD-10-CM | POA: Diagnosis not present

## 2024-03-16 DIAGNOSIS — L89620 Pressure ulcer of left heel, unstageable: Secondary | ICD-10-CM | POA: Diagnosis not present

## 2024-03-16 DIAGNOSIS — R278 Other lack of coordination: Secondary | ICD-10-CM | POA: Diagnosis not present

## 2024-03-16 DIAGNOSIS — R0989 Other specified symptoms and signs involving the circulatory and respiratory systems: Secondary | ICD-10-CM | POA: Diagnosis not present

## 2024-03-16 DIAGNOSIS — J189 Pneumonia, unspecified organism: Secondary | ICD-10-CM | POA: Diagnosis not present

## 2024-03-16 DIAGNOSIS — M6281 Muscle weakness (generalized): Secondary | ICD-10-CM | POA: Diagnosis not present

## 2024-03-16 DIAGNOSIS — G35 Multiple sclerosis: Secondary | ICD-10-CM | POA: Diagnosis not present

## 2024-03-16 DIAGNOSIS — M4628 Osteomyelitis of vertebra, sacral and sacrococcygeal region: Secondary | ICD-10-CM | POA: Diagnosis not present

## 2024-03-16 DIAGNOSIS — L89616 Pressure-induced deep tissue damage of right heel: Secondary | ICD-10-CM | POA: Diagnosis not present

## 2024-03-17 ENCOUNTER — Ambulatory Visit: Payer: Self-pay | Admitting: Neurology

## 2024-03-17 DIAGNOSIS — R278 Other lack of coordination: Secondary | ICD-10-CM | POA: Diagnosis not present

## 2024-03-17 DIAGNOSIS — I6310 Cerebral infarction due to embolism of unspecified precerebral artery: Secondary | ICD-10-CM | POA: Diagnosis not present

## 2024-03-17 DIAGNOSIS — I639 Cerebral infarction, unspecified: Secondary | ICD-10-CM

## 2024-03-17 DIAGNOSIS — J189 Pneumonia, unspecified organism: Secondary | ICD-10-CM | POA: Diagnosis not present

## 2024-03-17 DIAGNOSIS — G35 Multiple sclerosis: Secondary | ICD-10-CM | POA: Diagnosis not present

## 2024-03-17 DIAGNOSIS — M4628 Osteomyelitis of vertebra, sacral and sacrococcygeal region: Secondary | ICD-10-CM | POA: Diagnosis not present

## 2024-03-17 DIAGNOSIS — M5416 Radiculopathy, lumbar region: Secondary | ICD-10-CM | POA: Diagnosis not present

## 2024-03-17 DIAGNOSIS — R1312 Dysphagia, oropharyngeal phase: Secondary | ICD-10-CM | POA: Diagnosis not present

## 2024-03-17 DIAGNOSIS — M6281 Muscle weakness (generalized): Secondary | ICD-10-CM | POA: Diagnosis not present

## 2024-03-18 DIAGNOSIS — M6281 Muscle weakness (generalized): Secondary | ICD-10-CM | POA: Diagnosis not present

## 2024-03-18 DIAGNOSIS — L89154 Pressure ulcer of sacral region, stage 4: Secondary | ICD-10-CM | POA: Diagnosis not present

## 2024-03-18 DIAGNOSIS — G35 Multiple sclerosis: Secondary | ICD-10-CM | POA: Diagnosis not present

## 2024-03-18 DIAGNOSIS — R278 Other lack of coordination: Secondary | ICD-10-CM | POA: Diagnosis not present

## 2024-03-18 DIAGNOSIS — M4628 Osteomyelitis of vertebra, sacral and sacrococcygeal region: Secondary | ICD-10-CM | POA: Diagnosis not present

## 2024-03-18 DIAGNOSIS — R1312 Dysphagia, oropharyngeal phase: Secondary | ICD-10-CM | POA: Diagnosis not present

## 2024-03-18 DIAGNOSIS — J8410 Pulmonary fibrosis, unspecified: Secondary | ICD-10-CM | POA: Diagnosis not present

## 2024-03-18 DIAGNOSIS — N184 Chronic kidney disease, stage 4 (severe): Secondary | ICD-10-CM | POA: Diagnosis not present

## 2024-03-18 DIAGNOSIS — J189 Pneumonia, unspecified organism: Secondary | ICD-10-CM | POA: Diagnosis not present

## 2024-03-21 DIAGNOSIS — R1312 Dysphagia, oropharyngeal phase: Secondary | ICD-10-CM | POA: Diagnosis not present

## 2024-03-21 DIAGNOSIS — R7989 Other specified abnormal findings of blood chemistry: Secondary | ICD-10-CM | POA: Diagnosis not present

## 2024-03-21 DIAGNOSIS — I2510 Atherosclerotic heart disease of native coronary artery without angina pectoris: Secondary | ICD-10-CM | POA: Diagnosis not present

## 2024-03-21 DIAGNOSIS — M4628 Osteomyelitis of vertebra, sacral and sacrococcygeal region: Secondary | ICD-10-CM | POA: Diagnosis not present

## 2024-03-21 DIAGNOSIS — Z130 Encounter for screening for diseases of the blood and blood-forming organs and certain disorders involving the immune mechanism: Secondary | ICD-10-CM | POA: Diagnosis not present

## 2024-03-21 DIAGNOSIS — J189 Pneumonia, unspecified organism: Secondary | ICD-10-CM | POA: Diagnosis not present

## 2024-03-21 DIAGNOSIS — I5030 Unspecified diastolic (congestive) heart failure: Secondary | ICD-10-CM | POA: Diagnosis not present

## 2024-03-21 DIAGNOSIS — M6281 Muscle weakness (generalized): Secondary | ICD-10-CM | POA: Diagnosis not present

## 2024-03-21 DIAGNOSIS — R278 Other lack of coordination: Secondary | ICD-10-CM | POA: Diagnosis not present

## 2024-03-21 DIAGNOSIS — G35 Multiple sclerosis: Secondary | ICD-10-CM | POA: Diagnosis not present

## 2024-03-21 DIAGNOSIS — R700 Elevated erythrocyte sedimentation rate: Secondary | ICD-10-CM | POA: Diagnosis not present

## 2024-03-21 DIAGNOSIS — R7982 Elevated C-reactive protein (CRP): Secondary | ICD-10-CM | POA: Diagnosis not present

## 2024-03-22 DIAGNOSIS — M4628 Osteomyelitis of vertebra, sacral and sacrococcygeal region: Secondary | ICD-10-CM | POA: Diagnosis not present

## 2024-03-22 DIAGNOSIS — R278 Other lack of coordination: Secondary | ICD-10-CM | POA: Diagnosis not present

## 2024-03-22 DIAGNOSIS — J189 Pneumonia, unspecified organism: Secondary | ICD-10-CM | POA: Diagnosis not present

## 2024-03-22 DIAGNOSIS — G35 Multiple sclerosis: Secondary | ICD-10-CM | POA: Diagnosis not present

## 2024-03-22 DIAGNOSIS — M6281 Muscle weakness (generalized): Secondary | ICD-10-CM | POA: Diagnosis not present

## 2024-03-22 DIAGNOSIS — R1312 Dysphagia, oropharyngeal phase: Secondary | ICD-10-CM | POA: Diagnosis not present

## 2024-03-23 DIAGNOSIS — R1312 Dysphagia, oropharyngeal phase: Secondary | ICD-10-CM | POA: Diagnosis not present

## 2024-03-23 DIAGNOSIS — L89620 Pressure ulcer of left heel, unstageable: Secondary | ICD-10-CM | POA: Diagnosis not present

## 2024-03-23 DIAGNOSIS — L89323 Pressure ulcer of left buttock, stage 3: Secondary | ICD-10-CM | POA: Diagnosis not present

## 2024-03-23 DIAGNOSIS — G35 Multiple sclerosis: Secondary | ICD-10-CM | POA: Diagnosis not present

## 2024-03-23 DIAGNOSIS — L89896 Pressure-induced deep tissue damage of other site: Secondary | ICD-10-CM | POA: Diagnosis not present

## 2024-03-23 DIAGNOSIS — L89154 Pressure ulcer of sacral region, stage 4: Secondary | ICD-10-CM | POA: Diagnosis not present

## 2024-03-23 DIAGNOSIS — M6281 Muscle weakness (generalized): Secondary | ICD-10-CM | POA: Diagnosis not present

## 2024-03-23 DIAGNOSIS — J189 Pneumonia, unspecified organism: Secondary | ICD-10-CM | POA: Diagnosis not present

## 2024-03-23 DIAGNOSIS — R159 Full incontinence of feces: Secondary | ICD-10-CM | POA: Diagnosis not present

## 2024-03-23 DIAGNOSIS — L89616 Pressure-induced deep tissue damage of right heel: Secondary | ICD-10-CM | POA: Diagnosis not present

## 2024-03-23 DIAGNOSIS — R278 Other lack of coordination: Secondary | ICD-10-CM | POA: Diagnosis not present

## 2024-03-23 DIAGNOSIS — L89510 Pressure ulcer of right ankle, unstageable: Secondary | ICD-10-CM | POA: Diagnosis not present

## 2024-03-23 DIAGNOSIS — M4628 Osteomyelitis of vertebra, sacral and sacrococcygeal region: Secondary | ICD-10-CM | POA: Diagnosis not present

## 2024-03-23 DIAGNOSIS — L89314 Pressure ulcer of right buttock, stage 4: Secondary | ICD-10-CM | POA: Diagnosis not present

## 2024-03-23 DIAGNOSIS — G894 Chronic pain syndrome: Secondary | ICD-10-CM | POA: Diagnosis not present

## 2024-03-24 ENCOUNTER — Other Ambulatory Visit (HOSPITAL_COMMUNITY): Payer: Self-pay | Admitting: Radiology

## 2024-03-24 DIAGNOSIS — M533 Sacrococcygeal disorders, not elsewhere classified: Secondary | ICD-10-CM | POA: Diagnosis not present

## 2024-03-24 DIAGNOSIS — M4628 Osteomyelitis of vertebra, sacral and sacrococcygeal region: Secondary | ICD-10-CM | POA: Diagnosis not present

## 2024-03-24 DIAGNOSIS — N319 Neuromuscular dysfunction of bladder, unspecified: Secondary | ICD-10-CM

## 2024-03-24 DIAGNOSIS — J8410 Pulmonary fibrosis, unspecified: Secondary | ICD-10-CM | POA: Diagnosis not present

## 2024-03-24 DIAGNOSIS — L89154 Pressure ulcer of sacral region, stage 4: Secondary | ICD-10-CM | POA: Diagnosis not present

## 2024-03-24 DIAGNOSIS — L97509 Non-pressure chronic ulcer of other part of unspecified foot with unspecified severity: Secondary | ICD-10-CM | POA: Diagnosis not present

## 2024-03-24 DIAGNOSIS — N184 Chronic kidney disease, stage 4 (severe): Secondary | ICD-10-CM | POA: Diagnosis not present

## 2024-03-25 ENCOUNTER — Ambulatory Visit (HOSPITAL_COMMUNITY)
Admission: RE | Admit: 2024-03-25 | Discharge: 2024-03-25 | Disposition: A | Discharge status: Home / Self Care | Source: Ambulatory Visit | Attending: Radiology | Admitting: Radiology

## 2024-03-25 ENCOUNTER — Other Ambulatory Visit (HOSPITAL_COMMUNITY): Payer: Self-pay | Admitting: Radiology

## 2024-03-25 DIAGNOSIS — M4628 Osteomyelitis of vertebra, sacral and sacrococcygeal region: Secondary | ICD-10-CM | POA: Diagnosis not present

## 2024-03-25 DIAGNOSIS — N319 Neuromuscular dysfunction of bladder, unspecified: Secondary | ICD-10-CM | POA: Diagnosis not present

## 2024-03-25 DIAGNOSIS — G35 Multiple sclerosis: Secondary | ICD-10-CM | POA: Diagnosis not present

## 2024-03-25 DIAGNOSIS — J189 Pneumonia, unspecified organism: Secondary | ICD-10-CM | POA: Diagnosis not present

## 2024-03-25 DIAGNOSIS — R1312 Dysphagia, oropharyngeal phase: Secondary | ICD-10-CM | POA: Diagnosis not present

## 2024-03-25 DIAGNOSIS — M6281 Muscle weakness (generalized): Secondary | ICD-10-CM | POA: Diagnosis not present

## 2024-03-25 DIAGNOSIS — T83022A Displacement of nephrostomy catheter, initial encounter: Secondary | ICD-10-CM | POA: Diagnosis not present

## 2024-03-25 DIAGNOSIS — R52 Pain, unspecified: Secondary | ICD-10-CM | POA: Diagnosis not present

## 2024-03-25 DIAGNOSIS — Z436 Encounter for attention to other artificial openings of urinary tract: Secondary | ICD-10-CM | POA: Insufficient documentation

## 2024-03-25 DIAGNOSIS — R278 Other lack of coordination: Secondary | ICD-10-CM | POA: Diagnosis not present

## 2024-03-25 DIAGNOSIS — L089 Local infection of the skin and subcutaneous tissue, unspecified: Secondary | ICD-10-CM | POA: Diagnosis not present

## 2024-03-25 DIAGNOSIS — K219 Gastro-esophageal reflux disease without esophagitis: Secondary | ICD-10-CM | POA: Diagnosis not present

## 2024-03-25 HISTORY — PX: IR NEPHROSTOMY EXCHANGE LEFT: IMG6069

## 2024-03-25 MED ORDER — IOHEXOL 300 MG/ML  SOLN
50.0000 mL | Freq: Once | INTRAMUSCULAR | Status: AC | PRN
  Administered 2024-03-25: 10 mL

## 2024-03-25 MED ORDER — LIDOCAINE HCL 1 % IJ SOLN
20.0000 mL | Freq: Once | INTRAMUSCULAR | Status: AC
  Administered 2024-03-25: 10 mL via INTRADERMAL

## 2024-03-25 MED ORDER — LIDOCAINE HCL 1 % IJ SOLN
INTRAMUSCULAR | Status: AC
  Filled 2024-03-25: qty 20

## 2024-03-26 DIAGNOSIS — J189 Pneumonia, unspecified organism: Secondary | ICD-10-CM | POA: Diagnosis not present

## 2024-03-26 DIAGNOSIS — G35 Multiple sclerosis: Secondary | ICD-10-CM | POA: Diagnosis not present

## 2024-03-26 DIAGNOSIS — R278 Other lack of coordination: Secondary | ICD-10-CM | POA: Diagnosis not present

## 2024-03-26 DIAGNOSIS — M6281 Muscle weakness (generalized): Secondary | ICD-10-CM | POA: Diagnosis not present

## 2024-03-26 DIAGNOSIS — M4628 Osteomyelitis of vertebra, sacral and sacrococcygeal region: Secondary | ICD-10-CM | POA: Diagnosis not present

## 2024-03-26 DIAGNOSIS — R1312 Dysphagia, oropharyngeal phase: Secondary | ICD-10-CM | POA: Diagnosis not present

## 2024-03-28 DIAGNOSIS — M6281 Muscle weakness (generalized): Secondary | ICD-10-CM | POA: Diagnosis not present

## 2024-03-28 DIAGNOSIS — E440 Moderate protein-calorie malnutrition: Secondary | ICD-10-CM | POA: Diagnosis not present

## 2024-03-28 DIAGNOSIS — G35 Multiple sclerosis: Secondary | ICD-10-CM | POA: Diagnosis not present

## 2024-03-28 DIAGNOSIS — M4628 Osteomyelitis of vertebra, sacral and sacrococcygeal region: Secondary | ICD-10-CM | POA: Diagnosis not present

## 2024-03-28 DIAGNOSIS — R1312 Dysphagia, oropharyngeal phase: Secondary | ICD-10-CM | POA: Diagnosis not present

## 2024-03-28 DIAGNOSIS — J189 Pneumonia, unspecified organism: Secondary | ICD-10-CM | POA: Diagnosis not present

## 2024-03-28 DIAGNOSIS — R278 Other lack of coordination: Secondary | ICD-10-CM | POA: Diagnosis not present

## 2024-03-29 DIAGNOSIS — G35 Multiple sclerosis: Secondary | ICD-10-CM | POA: Diagnosis not present

## 2024-03-29 DIAGNOSIS — M6281 Muscle weakness (generalized): Secondary | ICD-10-CM | POA: Diagnosis not present

## 2024-03-29 DIAGNOSIS — N312 Flaccid neuropathic bladder, not elsewhere classified: Secondary | ICD-10-CM | POA: Diagnosis not present

## 2024-03-29 DIAGNOSIS — J189 Pneumonia, unspecified organism: Secondary | ICD-10-CM | POA: Diagnosis not present

## 2024-03-29 DIAGNOSIS — R278 Other lack of coordination: Secondary | ICD-10-CM | POA: Diagnosis not present

## 2024-03-29 DIAGNOSIS — M4628 Osteomyelitis of vertebra, sacral and sacrococcygeal region: Secondary | ICD-10-CM | POA: Diagnosis not present

## 2024-03-29 DIAGNOSIS — R1312 Dysphagia, oropharyngeal phase: Secondary | ICD-10-CM | POA: Diagnosis not present

## 2024-03-30 ENCOUNTER — Ambulatory Visit (INDEPENDENT_AMBULATORY_CARE_PROVIDER_SITE_OTHER)

## 2024-03-30 DIAGNOSIS — J189 Pneumonia, unspecified organism: Secondary | ICD-10-CM | POA: Diagnosis not present

## 2024-03-30 DIAGNOSIS — R1312 Dysphagia, oropharyngeal phase: Secondary | ICD-10-CM | POA: Diagnosis not present

## 2024-03-30 DIAGNOSIS — L89616 Pressure-induced deep tissue damage of right heel: Secondary | ICD-10-CM | POA: Diagnosis not present

## 2024-03-30 DIAGNOSIS — G35 Multiple sclerosis: Secondary | ICD-10-CM | POA: Diagnosis not present

## 2024-03-30 DIAGNOSIS — R278 Other lack of coordination: Secondary | ICD-10-CM | POA: Diagnosis not present

## 2024-03-30 DIAGNOSIS — G894 Chronic pain syndrome: Secondary | ICD-10-CM | POA: Diagnosis not present

## 2024-03-30 DIAGNOSIS — M4628 Osteomyelitis of vertebra, sacral and sacrococcygeal region: Secondary | ICD-10-CM | POA: Diagnosis not present

## 2024-03-30 DIAGNOSIS — L89323 Pressure ulcer of left buttock, stage 3: Secondary | ICD-10-CM | POA: Diagnosis not present

## 2024-03-30 DIAGNOSIS — L89620 Pressure ulcer of left heel, unstageable: Secondary | ICD-10-CM | POA: Diagnosis not present

## 2024-03-30 DIAGNOSIS — R159 Full incontinence of feces: Secondary | ICD-10-CM | POA: Diagnosis not present

## 2024-03-30 DIAGNOSIS — L89154 Pressure ulcer of sacral region, stage 4: Secondary | ICD-10-CM | POA: Diagnosis not present

## 2024-03-30 DIAGNOSIS — L89896 Pressure-induced deep tissue damage of other site: Secondary | ICD-10-CM | POA: Diagnosis not present

## 2024-03-30 DIAGNOSIS — L89314 Pressure ulcer of right buttock, stage 4: Secondary | ICD-10-CM | POA: Diagnosis not present

## 2024-03-30 DIAGNOSIS — R339 Retention of urine, unspecified: Secondary | ICD-10-CM | POA: Diagnosis not present

## 2024-03-30 DIAGNOSIS — M6281 Muscle weakness (generalized): Secondary | ICD-10-CM | POA: Diagnosis not present

## 2024-03-30 DIAGNOSIS — L89510 Pressure ulcer of right ankle, unstageable: Secondary | ICD-10-CM | POA: Diagnosis not present

## 2024-03-30 NOTE — Progress Notes (Signed)
 Suprapubic Cath Change  Patient is present today for a suprapubic catheter change due to urinary retention.  10ml of water  was drained from the balloon, a 24FR foley cath was removed from the tract with out difficulty.  Suprapubic catheter site was cleaned and prepped in a sterile fashion with Betadinex3  A 24FR foley cath was replaced into the tract no complications were noted. Urine return was noted, urine Dark yellow in color . 10 ml of sterile water  was inflated into the balloon and a bed bag was attached for drainage.  Patient tolerated well. A night bag was given to patient and proper instruction was given on how to switch bags.    Performed by: Exie DASEN. CMA  Follow up: 4 weeks

## 2024-03-31 DIAGNOSIS — E7800 Pure hypercholesterolemia, unspecified: Secondary | ICD-10-CM | POA: Diagnosis not present

## 2024-03-31 DIAGNOSIS — R278 Other lack of coordination: Secondary | ICD-10-CM | POA: Diagnosis not present

## 2024-03-31 DIAGNOSIS — M4628 Osteomyelitis of vertebra, sacral and sacrococcygeal region: Secondary | ICD-10-CM | POA: Diagnosis not present

## 2024-03-31 DIAGNOSIS — M6281 Muscle weakness (generalized): Secondary | ICD-10-CM | POA: Diagnosis not present

## 2024-03-31 DIAGNOSIS — G35 Multiple sclerosis: Secondary | ICD-10-CM | POA: Diagnosis not present

## 2024-03-31 DIAGNOSIS — J189 Pneumonia, unspecified organism: Secondary | ICD-10-CM | POA: Diagnosis not present

## 2024-03-31 DIAGNOSIS — R1312 Dysphagia, oropharyngeal phase: Secondary | ICD-10-CM | POA: Diagnosis not present

## 2024-04-01 DIAGNOSIS — J189 Pneumonia, unspecified organism: Secondary | ICD-10-CM | POA: Diagnosis not present

## 2024-04-01 DIAGNOSIS — M4628 Osteomyelitis of vertebra, sacral and sacrococcygeal region: Secondary | ICD-10-CM | POA: Diagnosis not present

## 2024-04-01 DIAGNOSIS — G35 Multiple sclerosis: Secondary | ICD-10-CM | POA: Diagnosis not present

## 2024-04-01 DIAGNOSIS — M6281 Muscle weakness (generalized): Secondary | ICD-10-CM | POA: Diagnosis not present

## 2024-04-01 DIAGNOSIS — R278 Other lack of coordination: Secondary | ICD-10-CM | POA: Diagnosis not present

## 2024-04-01 DIAGNOSIS — R1312 Dysphagia, oropharyngeal phase: Secondary | ICD-10-CM | POA: Diagnosis not present

## 2024-04-04 DIAGNOSIS — G35 Multiple sclerosis: Secondary | ICD-10-CM | POA: Diagnosis not present

## 2024-04-04 DIAGNOSIS — M4628 Osteomyelitis of vertebra, sacral and sacrococcygeal region: Secondary | ICD-10-CM | POA: Diagnosis not present

## 2024-04-04 DIAGNOSIS — J189 Pneumonia, unspecified organism: Secondary | ICD-10-CM | POA: Diagnosis not present

## 2024-04-04 DIAGNOSIS — R278 Other lack of coordination: Secondary | ICD-10-CM | POA: Diagnosis not present

## 2024-04-04 DIAGNOSIS — M6281 Muscle weakness (generalized): Secondary | ICD-10-CM | POA: Diagnosis not present

## 2024-04-04 DIAGNOSIS — R1312 Dysphagia, oropharyngeal phase: Secondary | ICD-10-CM | POA: Diagnosis not present

## 2024-04-05 DIAGNOSIS — N184 Chronic kidney disease, stage 4 (severe): Secondary | ICD-10-CM | POA: Diagnosis not present

## 2024-04-05 DIAGNOSIS — L89154 Pressure ulcer of sacral region, stage 4: Secondary | ICD-10-CM | POA: Diagnosis not present

## 2024-04-05 DIAGNOSIS — J8410 Pulmonary fibrosis, unspecified: Secondary | ICD-10-CM | POA: Diagnosis not present

## 2024-04-05 DIAGNOSIS — M4628 Osteomyelitis of vertebra, sacral and sacrococcygeal region: Secondary | ICD-10-CM | POA: Diagnosis not present

## 2024-04-05 DIAGNOSIS — R1312 Dysphagia, oropharyngeal phase: Secondary | ICD-10-CM | POA: Diagnosis not present

## 2024-04-05 DIAGNOSIS — R278 Other lack of coordination: Secondary | ICD-10-CM | POA: Diagnosis not present

## 2024-04-05 DIAGNOSIS — J189 Pneumonia, unspecified organism: Secondary | ICD-10-CM | POA: Diagnosis not present

## 2024-04-05 DIAGNOSIS — G35 Multiple sclerosis: Secondary | ICD-10-CM | POA: Diagnosis not present

## 2024-04-05 DIAGNOSIS — M6281 Muscle weakness (generalized): Secondary | ICD-10-CM | POA: Diagnosis not present

## 2024-04-06 DIAGNOSIS — L89896 Pressure-induced deep tissue damage of other site: Secondary | ICD-10-CM | POA: Diagnosis not present

## 2024-04-06 DIAGNOSIS — L89323 Pressure ulcer of left buttock, stage 3: Secondary | ICD-10-CM | POA: Diagnosis not present

## 2024-04-06 DIAGNOSIS — R1312 Dysphagia, oropharyngeal phase: Secondary | ICD-10-CM | POA: Diagnosis not present

## 2024-04-06 DIAGNOSIS — L89510 Pressure ulcer of right ankle, unstageable: Secondary | ICD-10-CM | POA: Diagnosis not present

## 2024-04-06 DIAGNOSIS — L89623 Pressure ulcer of left heel, stage 3: Secondary | ICD-10-CM | POA: Diagnosis not present

## 2024-04-06 DIAGNOSIS — L89616 Pressure-induced deep tissue damage of right heel: Secondary | ICD-10-CM | POA: Diagnosis not present

## 2024-04-06 DIAGNOSIS — R278 Other lack of coordination: Secondary | ICD-10-CM | POA: Diagnosis not present

## 2024-04-06 DIAGNOSIS — M6281 Muscle weakness (generalized): Secondary | ICD-10-CM | POA: Diagnosis not present

## 2024-04-06 DIAGNOSIS — R159 Full incontinence of feces: Secondary | ICD-10-CM | POA: Diagnosis not present

## 2024-04-06 DIAGNOSIS — M4628 Osteomyelitis of vertebra, sacral and sacrococcygeal region: Secondary | ICD-10-CM | POA: Diagnosis not present

## 2024-04-06 DIAGNOSIS — J189 Pneumonia, unspecified organism: Secondary | ICD-10-CM | POA: Diagnosis not present

## 2024-04-06 DIAGNOSIS — L89154 Pressure ulcer of sacral region, stage 4: Secondary | ICD-10-CM | POA: Diagnosis not present

## 2024-04-06 DIAGNOSIS — G35 Multiple sclerosis: Secondary | ICD-10-CM | POA: Diagnosis not present

## 2024-04-06 DIAGNOSIS — L89314 Pressure ulcer of right buttock, stage 4: Secondary | ICD-10-CM | POA: Diagnosis not present

## 2024-04-06 DIAGNOSIS — G894 Chronic pain syndrome: Secondary | ICD-10-CM | POA: Diagnosis not present

## 2024-04-07 DIAGNOSIS — M4628 Osteomyelitis of vertebra, sacral and sacrococcygeal region: Secondary | ICD-10-CM | POA: Diagnosis not present

## 2024-04-07 DIAGNOSIS — F419 Anxiety disorder, unspecified: Secondary | ICD-10-CM | POA: Diagnosis not present

## 2024-04-07 DIAGNOSIS — F32A Depression, unspecified: Secondary | ICD-10-CM | POA: Diagnosis not present

## 2024-04-07 DIAGNOSIS — J8410 Pulmonary fibrosis, unspecified: Secondary | ICD-10-CM | POA: Diagnosis not present

## 2024-04-07 DIAGNOSIS — F5102 Adjustment insomnia: Secondary | ICD-10-CM | POA: Diagnosis not present

## 2024-04-07 DIAGNOSIS — L89154 Pressure ulcer of sacral region, stage 4: Secondary | ICD-10-CM | POA: Diagnosis not present

## 2024-04-07 DIAGNOSIS — E440 Moderate protein-calorie malnutrition: Secondary | ICD-10-CM | POA: Diagnosis not present

## 2024-04-07 DIAGNOSIS — J189 Pneumonia, unspecified organism: Secondary | ICD-10-CM | POA: Diagnosis not present

## 2024-04-07 DIAGNOSIS — J439 Emphysema, unspecified: Secondary | ICD-10-CM | POA: Diagnosis not present

## 2024-04-07 DIAGNOSIS — G35 Multiple sclerosis: Secondary | ICD-10-CM | POA: Diagnosis not present

## 2024-04-07 DIAGNOSIS — J449 Chronic obstructive pulmonary disease, unspecified: Secondary | ICD-10-CM | POA: Diagnosis not present

## 2024-04-07 DIAGNOSIS — R278 Other lack of coordination: Secondary | ICD-10-CM | POA: Diagnosis not present

## 2024-04-07 DIAGNOSIS — R1312 Dysphagia, oropharyngeal phase: Secondary | ICD-10-CM | POA: Diagnosis not present

## 2024-04-07 DIAGNOSIS — M6281 Muscle weakness (generalized): Secondary | ICD-10-CM | POA: Diagnosis not present

## 2024-04-07 DIAGNOSIS — I679 Cerebrovascular disease, unspecified: Secondary | ICD-10-CM | POA: Diagnosis not present

## 2024-04-08 DIAGNOSIS — M4628 Osteomyelitis of vertebra, sacral and sacrococcygeal region: Secondary | ICD-10-CM | POA: Diagnosis not present

## 2024-04-08 DIAGNOSIS — R1312 Dysphagia, oropharyngeal phase: Secondary | ICD-10-CM | POA: Diagnosis not present

## 2024-04-08 DIAGNOSIS — J189 Pneumonia, unspecified organism: Secondary | ICD-10-CM | POA: Diagnosis not present

## 2024-04-08 DIAGNOSIS — G35 Multiple sclerosis: Secondary | ICD-10-CM | POA: Diagnosis not present

## 2024-04-08 DIAGNOSIS — M6281 Muscle weakness (generalized): Secondary | ICD-10-CM | POA: Diagnosis not present

## 2024-04-08 DIAGNOSIS — R278 Other lack of coordination: Secondary | ICD-10-CM | POA: Diagnosis not present

## 2024-04-09 DIAGNOSIS — R1312 Dysphagia, oropharyngeal phase: Secondary | ICD-10-CM | POA: Diagnosis not present

## 2024-04-09 DIAGNOSIS — G35 Multiple sclerosis: Secondary | ICD-10-CM | POA: Diagnosis not present

## 2024-04-09 DIAGNOSIS — J189 Pneumonia, unspecified organism: Secondary | ICD-10-CM | POA: Diagnosis not present

## 2024-04-09 DIAGNOSIS — M4628 Osteomyelitis of vertebra, sacral and sacrococcygeal region: Secondary | ICD-10-CM | POA: Diagnosis not present

## 2024-04-09 DIAGNOSIS — R278 Other lack of coordination: Secondary | ICD-10-CM | POA: Diagnosis not present

## 2024-04-09 DIAGNOSIS — M6281 Muscle weakness (generalized): Secondary | ICD-10-CM | POA: Diagnosis not present

## 2024-04-11 DIAGNOSIS — J189 Pneumonia, unspecified organism: Secondary | ICD-10-CM | POA: Diagnosis not present

## 2024-04-11 DIAGNOSIS — R278 Other lack of coordination: Secondary | ICD-10-CM | POA: Diagnosis not present

## 2024-04-11 DIAGNOSIS — G35 Multiple sclerosis: Secondary | ICD-10-CM | POA: Diagnosis not present

## 2024-04-11 DIAGNOSIS — M4628 Osteomyelitis of vertebra, sacral and sacrococcygeal region: Secondary | ICD-10-CM | POA: Diagnosis not present

## 2024-04-11 DIAGNOSIS — R1312 Dysphagia, oropharyngeal phase: Secondary | ICD-10-CM | POA: Diagnosis not present

## 2024-04-11 DIAGNOSIS — M6281 Muscle weakness (generalized): Secondary | ICD-10-CM | POA: Diagnosis not present

## 2024-04-12 DIAGNOSIS — J189 Pneumonia, unspecified organism: Secondary | ICD-10-CM | POA: Diagnosis not present

## 2024-04-12 DIAGNOSIS — G35 Multiple sclerosis: Secondary | ICD-10-CM | POA: Diagnosis not present

## 2024-04-12 DIAGNOSIS — M4628 Osteomyelitis of vertebra, sacral and sacrococcygeal region: Secondary | ICD-10-CM | POA: Diagnosis not present

## 2024-04-12 DIAGNOSIS — M6281 Muscle weakness (generalized): Secondary | ICD-10-CM | POA: Diagnosis not present

## 2024-04-12 DIAGNOSIS — R278 Other lack of coordination: Secondary | ICD-10-CM | POA: Diagnosis not present

## 2024-04-12 DIAGNOSIS — R1312 Dysphagia, oropharyngeal phase: Secondary | ICD-10-CM | POA: Diagnosis not present

## 2024-04-13 DIAGNOSIS — L89314 Pressure ulcer of right buttock, stage 4: Secondary | ICD-10-CM | POA: Diagnosis not present

## 2024-04-13 DIAGNOSIS — R278 Other lack of coordination: Secondary | ICD-10-CM | POA: Diagnosis not present

## 2024-04-13 DIAGNOSIS — G894 Chronic pain syndrome: Secondary | ICD-10-CM | POA: Diagnosis not present

## 2024-04-13 DIAGNOSIS — L89616 Pressure-induced deep tissue damage of right heel: Secondary | ICD-10-CM | POA: Diagnosis not present

## 2024-04-13 DIAGNOSIS — L89323 Pressure ulcer of left buttock, stage 3: Secondary | ICD-10-CM | POA: Diagnosis not present

## 2024-04-13 DIAGNOSIS — L89623 Pressure ulcer of left heel, stage 3: Secondary | ICD-10-CM | POA: Diagnosis not present

## 2024-04-13 DIAGNOSIS — J189 Pneumonia, unspecified organism: Secondary | ICD-10-CM | POA: Diagnosis not present

## 2024-04-13 DIAGNOSIS — L89896 Pressure-induced deep tissue damage of other site: Secondary | ICD-10-CM | POA: Diagnosis not present

## 2024-04-13 DIAGNOSIS — M6281 Muscle weakness (generalized): Secondary | ICD-10-CM | POA: Diagnosis not present

## 2024-04-13 DIAGNOSIS — R1312 Dysphagia, oropharyngeal phase: Secondary | ICD-10-CM | POA: Diagnosis not present

## 2024-04-13 DIAGNOSIS — L89510 Pressure ulcer of right ankle, unstageable: Secondary | ICD-10-CM | POA: Diagnosis not present

## 2024-04-13 DIAGNOSIS — R159 Full incontinence of feces: Secondary | ICD-10-CM | POA: Diagnosis not present

## 2024-04-13 DIAGNOSIS — M4628 Osteomyelitis of vertebra, sacral and sacrococcygeal region: Secondary | ICD-10-CM | POA: Diagnosis not present

## 2024-04-13 DIAGNOSIS — G35 Multiple sclerosis: Secondary | ICD-10-CM | POA: Diagnosis not present

## 2024-04-13 DIAGNOSIS — L89154 Pressure ulcer of sacral region, stage 4: Secondary | ICD-10-CM | POA: Diagnosis not present

## 2024-04-14 DIAGNOSIS — J8410 Pulmonary fibrosis, unspecified: Secondary | ICD-10-CM | POA: Diagnosis not present

## 2024-04-14 DIAGNOSIS — M4628 Osteomyelitis of vertebra, sacral and sacrococcygeal region: Secondary | ICD-10-CM | POA: Diagnosis not present

## 2024-04-14 DIAGNOSIS — G35 Multiple sclerosis: Secondary | ICD-10-CM | POA: Diagnosis not present

## 2024-04-14 DIAGNOSIS — R278 Other lack of coordination: Secondary | ICD-10-CM | POA: Diagnosis not present

## 2024-04-14 DIAGNOSIS — N184 Chronic kidney disease, stage 4 (severe): Secondary | ICD-10-CM | POA: Diagnosis not present

## 2024-04-14 DIAGNOSIS — J189 Pneumonia, unspecified organism: Secondary | ICD-10-CM | POA: Diagnosis not present

## 2024-04-14 DIAGNOSIS — R1312 Dysphagia, oropharyngeal phase: Secondary | ICD-10-CM | POA: Diagnosis not present

## 2024-04-14 DIAGNOSIS — M6281 Muscle weakness (generalized): Secondary | ICD-10-CM | POA: Diagnosis not present

## 2024-04-14 DIAGNOSIS — L89154 Pressure ulcer of sacral region, stage 4: Secondary | ICD-10-CM | POA: Diagnosis not present

## 2024-04-15 DIAGNOSIS — J189 Pneumonia, unspecified organism: Secondary | ICD-10-CM | POA: Diagnosis not present

## 2024-04-15 DIAGNOSIS — R278 Other lack of coordination: Secondary | ICD-10-CM | POA: Diagnosis not present

## 2024-04-15 DIAGNOSIS — M4628 Osteomyelitis of vertebra, sacral and sacrococcygeal region: Secondary | ICD-10-CM | POA: Diagnosis not present

## 2024-04-15 DIAGNOSIS — R1312 Dysphagia, oropharyngeal phase: Secondary | ICD-10-CM | POA: Diagnosis not present

## 2024-04-15 DIAGNOSIS — G35 Multiple sclerosis: Secondary | ICD-10-CM | POA: Diagnosis not present

## 2024-04-15 DIAGNOSIS — M6281 Muscle weakness (generalized): Secondary | ICD-10-CM | POA: Diagnosis not present

## 2024-04-18 DIAGNOSIS — G35 Multiple sclerosis: Secondary | ICD-10-CM | POA: Diagnosis not present

## 2024-04-18 DIAGNOSIS — J189 Pneumonia, unspecified organism: Secondary | ICD-10-CM | POA: Diagnosis not present

## 2024-04-18 DIAGNOSIS — R278 Other lack of coordination: Secondary | ICD-10-CM | POA: Diagnosis not present

## 2024-04-18 DIAGNOSIS — R1312 Dysphagia, oropharyngeal phase: Secondary | ICD-10-CM | POA: Diagnosis not present

## 2024-04-18 DIAGNOSIS — M4628 Osteomyelitis of vertebra, sacral and sacrococcygeal region: Secondary | ICD-10-CM | POA: Diagnosis not present

## 2024-04-18 DIAGNOSIS — M6281 Muscle weakness (generalized): Secondary | ICD-10-CM | POA: Diagnosis not present

## 2024-04-18 NOTE — Progress Notes (Signed)
 Impression/Assessment:  1.  Neurogenic bladder with chronic spt..  He has an atrophic right kidney but does have urinary output from his suprapubic tube.  We will continue to leave that in and change it every 4 weeks  2.  Left hydronephrosis with recurrent urolithiasis.  He is drained on that side with the percutaneous nephrostomy tube which has been working well for him.  His next nephrostomy tube change will be next week.  3.  Stable 10 x 16 mm left renal stone we are following this  4.  Clogging of nephrostomy tube  Plan:  1.  I recommend patient have his nephrostomy tube irrigated with 5 cc of saline in/out on Mondays and Thursdays every week, as well as as prn indequate drainage  2.  His suprapubic tube was changed today  3.  We will bring him back monthly for suprapubic tube changes, and 3 months for him to see me  History of Present Illness:   He has a neurogenic bladder and has had a longstanding suprapubic tube.  Currently, output is not great as he has an atrophic right  kidney and a left percutaneous nephrostomy.  He still has bladder spasms and is on Myrbetriq  25 mg a day.   He has recurrent urolithiasis, left renal unit.  Because of multiple ureteral stones and longstanding hydronephrosis, he had a left-sided nephrostomy tube placed in May, 2022.     CT abdomen and pelvis for follow-up of urolithiasis was performed 12.11.2022 revealing hydronephrosis and misplaced percutaneous tube.  He did have a 16mm left renal pelvic stone at that time.   1.31.2023: Here because of recent fever/probable UTI.  Had a suprapubic tube changed on the 27th.     4.18.2023: Here today for routine check.  Has suprapubic tube scheduled to be changed today.  In early 2023 his left renal pelvic stone was 16 mm in size.  He is having no symptoms from that.  The large majority of his urine comes out his left nephrostomy tube.  No recent treatment for urinary tract infections.  7.18.2023: Here for f/u.  KUB 5.23.203 revealed continued 16 mm Lt renal stone. No real problems noted since last visit here. He takes levaquin  before tube changes.  1.14.2024: Owenn is here today for routine check/catheter change.  Still getting his nephrostomy tube changed about every 6 weeks, his suprapubic changed here every month.  Just discharged from the hospital yesterday for management of a minor stroke as well as pneumonia.  He is in the nursing facility temporarily.  Most recent CT scan revealed a 10 mm left renal stone.  4.15.2025: Here for routine f/u. Needed Lt perc tube replaced in late January.  Since he has been spending time in the nursing facility, he and his wife are concerned about secure treatment of his nephrostomy tube.  7.22.2025: His last nephrostomy tube change was on the 27th of last month.  His next plan tube change will be in 1 week.  He has had some issues with his nephrostomy tube clogging.  It is harder for him to have this irrigated at his nursing facility.  Currently, it is not draining properly.  Past Medical History:  Diagnosis Date   Anemia    Arthritis    Back pain, chronic    Bilateral carotid bruits    C. difficile colitis 09/2011   CAD (coronary artery disease)    Carotid artery stenosis    Cerebrovascular disease    Cerebrovascular disease 08/17/2018  Colon polyps    Complication of cystostomy catheter, initial encounter (HCC) 04/20/2020   Dyslipidemia    Dysphagia 10/07/2011   Encephalopathy    Gait disorder    HA (headache)    High grade dysplasia in colonic adenoma 09/2005   History of kidney stones    HTN (hypertension), malignant 10/06/2011   Hypernatremia    Hypokalemia    Hypothyroidism 10/08/2011   Insomnia    Junctional rhythm    Kidney stones    MS (multiple sclerosis) (HCC)    Nausea and vomiting 03/02/2020   Neuromuscular disorder (HCC)    MS   OSA (obstructive sleep apnea)    Paroxysmal atrial tachycardia (HCC)    Peripheral vascular disease  (HCC)    Pneumonia 4 yrs ago   Pulmonary fibrosis (HCC) 10/06/2011   Pulmonary nodule 10/08/2011   PVD (peripheral vascular disease) (HCC)    Sacral ulcer (HCC)    Sleep apnea    cannot tolerate   Stroke (HCC)    left sided weakness   Suprapubic catheter (HCC)    TIA (transient ischemic attack)    Tremors of nervous system 10/08/2011   Urinary tract infection    Ventral hernia without obstruction or gangrene     Large 8X9cm ventral hernia with loss of domain. CT reads report as diastasis recti with herniation or diastasis recti.  Dr. Kallie, Surgery, reviewed CT with radiology and there is herniation with only hernia sac or peritoneum over the bowel and large separation of the rectus muslce (i.e. diastasis recti aka loss of domain).  No surgical intervention recommended given size, age, and health.     Past Surgical History:  Procedure Laterality Date   APPENDECTOMY  09/2005   at time of left hemicolectomy   BACK SURGERY  1976/1979   lower   BILIARY DILATION N/A 03/03/2020   Procedure: BILIARY DILATION;  Surgeon: Golda Claudis PENNER, MD;  Location: [facility];  Service: Endoscopy;  Laterality: N/A;   CATARACT EXTRACTION W/PHACO Right 03/08/2018   Procedure: CATARACT EXTRACTION PHACO AND INTRAOCULAR LENS PLACEMENT RIGHT EYE;  Surgeon: Perley Hamilton, MD;  Location: [facility];  Service: Ophthalmology;  Laterality: Right;  CDE: 8.86   CATARACT EXTRACTION W/PHACO Left 04/05/2018   Procedure: CATARACT EXTRACTION PHACO AND INTRAOCULAR LENS PLACEMENT (IOC);  Surgeon: Perley Hamilton, MD;  Location: [facility];  Service: Ophthalmology;  Laterality: Left;  CDE: 7.36   CENTRAL LINE INSERTION-TUNNELED Right 09/11/2020   Procedure: PLACEMENT OF TUNNELED CENTRAL LINE INTO JUGULAR VEIN;  Surgeon: Kallie Manuelita BROCKS, MD;  Location: [facility];  Service: General;  Laterality: Right;   CHOLECYSTECTOMY     Dr. Claudene   COLON SURGERY  09/2005   Fleishman: four tubular adenomas, large adenomatous polyp with HIGH GRADE  dysplasia   COLONOSCOPY  11/2004   Dr. Lendon sessile polyp splenic flexure, 10mm sessile polyp desc colon, tubulovillous adenoma (bx not removed)   COLONOSCOPY  01/2005   poor prep, polyp could not be found   COLONOSCOPY  05/2005   with EMR, polypectomy Dr. Norleen Dec, bx showed high grade dysplasia, partially resected   COLONOSCOPY  09/2005   Dr. Dec, [place] ink tattooing, four villous colon polyp (3 had been missed on previous colonoscopies due to limitations of procedures   COLONOSCOPY  09/2006   normal TI, no polyps   COLONOSCOPY  10/2007   Dr. Randol distal mammillations, benign bx, normal TI, random bx neg for microscopic colitis   CYSTOSCOPY W/ URETERAL STENT PLACEMENT Left  09/05/2020   Procedure: CYSTOSCOPY WITH RETROGRADE PYELOGRAM/URETERAL STENT PLACEMENT;  Surgeon: Cam Morene ORN, MD;  Location: [facility];  Service: Urology;  Laterality: Left;   CYSTOSCOPY WITH LITHOLAPAXY N/A 07/27/2018   Procedure: CYSTOSCOPY WITH LITHOLAPAXY VIA  SUPRAPUBIC TUBE;  Surgeon: Matilda Senior, MD;  Location: [facility];  Service: Urology;  Laterality: N/A;   CYSTOSCOPY WITH RETROGRADE PYELOGRAM, URETEROSCOPY AND STENT PLACEMENT Left 06/09/2017   Procedure: CYSTOSCOPY WITH LEFT RETROGRADE PYELOGRAM, LEFT URETEROSCOPY, LEFT URETEROSCOPIC STONE EXTRACTION, LEFT URETERAL STENT PLACEMENT;  Surgeon: Matilda Senior, MD;  Location: [facility];  Service: Urology;  Laterality: Left;   CYSTOSCOPY/URETEROSCOPY/HOLMIUM LASER/STENT PLACEMENT Left 05/22/2020   Procedure: CYSTOSCOPY/URETEROSCOPY/STENT PLACEMENT;  Surgeon: Matilda Senior, MD;  Location: [facility];  Service: Urology;  Laterality: Left;   ERCP N/A 03/03/2020   Procedure: ENDOSCOPIC RETROGRADE CHOLANGIOPANCREATOGRAPHY (ERCP);  Surgeon: Golda Claudis PENNER, MD;  [facility];  Service: Endoscopy;  Laterality: N/A;   ERCP N/A 04/20/2020   Procedure: ENDOSCOPIC RETROGRADE CHOLANGIOPANCREATOGRAPHY (ERCP);  Surgeon: Golda Claudis PENNER, MD;   [facility]: [facility];  Service: Endoscopy;  Laterality: N/A;  to be done at 7:30am in OR   GASTROINTESTINAL STENT REMOVAL N/A 04/20/2020   Procedure: STENT REMOVAL;  Surgeon: Golda Claudis PENNER, MD;  [facility]: [facility];  Service: Endoscopy;  Laterality: N/A;   INGUINAL HERNIA REPAIR  1971   bilateral   INSERTION OF SUPRAPUBIC CATHETER  06/09/2017   Procedure: EXCHANGE OF SUPRAPUBIC CATHETER;  Surgeon: Matilda Senior, MD;  Location: [facility];  Service: Urology;;   INSERTION OF SUPRAPUBIC CATHETER  05/22/2020   Procedure: SUPRAPUBIC CATHETER EXCHANGE;  Surgeon: Matilda Senior, MD;  Location: [facility];  Service: Urology;;   IR NEPHROSTOMY EXCHANGE LEFT  03/25/2021   IR NEPHROSTOMY EXCHANGE LEFT  05/20/2021   IR NEPHROSTOMY EXCHANGE LEFT  07/13/2021   IR NEPHROSTOMY EXCHANGE LEFT  09/09/2021   IR NEPHROSTOMY EXCHANGE LEFT  11/04/2021   IR NEPHROSTOMY EXCHANGE LEFT  12/23/2021   IR NEPHROSTOMY EXCHANGE LEFT  02/17/2022   IR NEPHROSTOMY EXCHANGE LEFT  04/14/2022   IR NEPHROSTOMY EXCHANGE LEFT  06/09/2022   IR NEPHROSTOMY EXCHANGE LEFT  07/28/2022   IR NEPHROSTOMY EXCHANGE LEFT  09/09/2022   IR NEPHROSTOMY EXCHANGE LEFT  11/04/2022   IR NEPHROSTOMY EXCHANGE LEFT  12/30/2022   IR NEPHROSTOMY EXCHANGE LEFT  02/25/2023   IR NEPHROSTOMY EXCHANGE LEFT  03/31/2023   IR NEPHROSTOMY EXCHANGE LEFT  05/26/2023   IR NEPHROSTOMY EXCHANGE LEFT  07/21/2023   IR NEPHROSTOMY EXCHANGE LEFT  09/15/2023   IR NEPHROSTOMY EXCHANGE LEFT  12/22/2023   IR NEPHROSTOMY EXCHANGE LEFT  03/25/2024   IR NEPHROSTOMY PLACEMENT LEFT  05/26/2017   IR NEPHROSTOMY PLACEMENT LEFT  05/02/2020   IR NEPHROSTOMY PLACEMENT LEFT  02/15/2021   IR NEPHROSTOMY PLACEMENT LEFT  02/22/2024   IR NEPHROSTOMY TUBE CHANGE  10/29/2023   IR URETERAL STENT PERC REMOVAL MOD SED  03/25/2021   KIDNEY STONE SURGERY  09/13/2015   LITHOTRIPSY N/A 03/03/2020   Procedure: MECHANICAL LITHOTRIPSY WITH REMOVAL OF MULTIPLE STONE FRAGMENTS;  Surgeon: Golda Claudis PENNER, MD;   [facility]: [facility];  Service: Endoscopy;  Laterality: N/A;   NEPHROLITHOTOMY Left 09/13/2015   Procedure: LEFT PERCUTANEOUS NEPHROLITHOTOMY ;  Surgeon: Senior Matilda, MD;  Location: [facility];  Service: Urology;  Laterality: Left;   NEPHROSTOMY TUBE REMOVAL  05/22/2020   Procedure: NEPHROSTOMY TUBE REMOVAL;  Surgeon: Matilda Senior, MD;  Location: [facility];  Service: Urology;;   [facility] REMOVAL Right 09/20/2020  Procedure: MINOR REMOVAL CENTRAL LINE;  Surgeon: Kallie Manuelita BROCKS, MD;  Location: [facility];  Service: General;  Laterality: Right;  Pt to arrive at 7:30am for procedure   REMOVAL OF STONES N/A 04/20/2020   Procedure: REMOVAL OF STONES;  Surgeon: Golda Claudis PENNER, MD;  [facility];  Service: Endoscopy;  Laterality: N/A;   SPHINCTEROTOMY  03/03/2020   Procedure: BILLARY SPHINCTEROTOMY;  Surgeon: Golda Claudis PENNER, MD;  [facility]: [facility];  Service: Endoscopy;;   SUPRAPUBIC CATHETER INSERTION      Home Medications:  Allergies as of 04/19/2024       Reactions   Ciprofloxacin  Other (See Comments)   Trouble swallowing according to wife Clemens out in the floor Pt can tolerate Levaquin    Tetracyclines & Related Anaphylaxis, Rash        Medication List        Accurate as of April 18, 2024 12:46 PM. If you have any questions, ask your nurse or doctor.          acetaminophen  325 MG tablet Commonly known as: TYLENOL  Take 650 mg by mouth every 4 (four) hours as needed for mild pain (pain score 1-3), moderate pain (pain score 4-6) or fever.   amLODipine  5 MG tablet Commonly known as: NORVASC  Take 1 tablet (5 mg total) by mouth daily.   ascorbic acid  500 MG tablet Commonly known as: VITAMIN C Take 500 mg by mouth every morning.   aspirin  81 MG chewable tablet Chew 1 tablet (81 mg total) by mouth daily with breakfast.   busPIRone 5 MG tablet Commonly known as: BUSPAR Take 5 mg by mouth 3 (three) times daily.   carboxymethylcellulose 1 %  ophthalmic solution Place 2 drops into both eyes every 4 (four) hours as needed (dry eyes).   cephALEXin  500 MG capsule Commonly known as: KEFLEX  Take 1 capsule (500 mg total) by mouth 4 (four) times daily.   diazepam  2 MG tablet Commonly known as: VALIUM  Take 1 tablet (2 mg total) by mouth 2 (two) times daily.   HYDROcodone -acetaminophen  5-325 MG tablet Commonly known as: NORCO/VICODIN Take 1 tablet by mouth every 8 (eight) hours as needed for moderate pain (pain score 4-6).   levofloxacin  500 MG tablet Commonly known as: LEVAQUIN  Take 1 tablet (500 mg total) by mouth See admin instructions. Every other day (Q 48 hrs) -starting 02/03/24 (02/03/24, 05/09, 05/11, 05/13 and 02/11/24)   levothyroxine  75 MCG tablet Commonly known as: SYNTHROID  Take 75 mcg by mouth in the morning.   melatonin 3 MG Tabs tablet Take 3 mg by mouth daily as needed (sleep).   methocarbamol  500 MG tablet Commonly known as: ROBAXIN  Take 500 mg by mouth every 6 (six) hours as needed for muscle spasms.   mirabegron  ER 50 MG Tb24 tablet Commonly known as: MYRBETRIQ  Take 1 tablet (50 mg total) by mouth daily.   mirtazapine  15 MG tablet Commonly known as: Remeron  Take 0.5 tablets (7.5 mg total) by mouth at bedtime.   multivitamin tablet Take 1 tablet by mouth every morning.   oxybutynin  5 MG 24 hr tablet Commonly known as: DITROPAN -XL Take 5 mg by mouth every morning.   OXYGEN  Inhale 1 L into the lungs every evening.   pantoprazole  40 MG tablet Commonly known as: PROTONIX  Take 1 tablet (40 mg total) by mouth daily.   polyethylene glycol 17 g packet Commonly known as: MIRALAX  / GLYCOLAX  Take 17 g by mouth every morning.   Potassium Bicarb-Citric Acid 20 MEQ  Tbef Take 20 mEq by mouth every morning.   PRO-STAT PROFILE PO Take 30 mLs by mouth 2 (two) times daily.   senna-docusate 8.6-50 MG tablet Commonly known as: Senokot-S Take 2 tablets by mouth at bedtime.   sodium chloride  0.65 %  Soln nasal spray Commonly known as: OCEAN Place 1 spray into both nostrils in the morning, at noon, in the evening, and at bedtime.   thiamine  100 MG tablet Commonly known as: Vitamin B-1 Take 1 tablet (100 mg total) by mouth daily.        Allergies:  Allergies  Allergen Reactions   Ciprofloxacin  Other (See Comments)    Trouble swallowing according to wife Clemens out in the floor Pt can tolerate Levaquin    Tetracyclines & Related Anaphylaxis and Rash    Family History  Problem Relation Age of Onset   Cirrhosis Brother        etoh   Stroke Mother 65   Coronary artery disease Father 52   Heart attack Brother    Cancer Sister    Multiple sclerosis Other    Colon cancer Neg Hx     Social History:  reports that he quit smoking about 36 years ago. His smoking use included cigarettes. He started smoking about 61 years ago. He has a 25 pack-year smoking history. He has never used smokeless tobacco. He reports that he does not drink alcohol  and does not use drugs.  ROS: A complete review of systems was performed.  All systems are negative except for pertinent findings as noted.  Physical Exam:  Vital signs in last 24 hours: There were no vitals taken for this visit. Constitutional:  Alert and oriented, No acute distress.  Cachectic appearing.  He is using nasal cannula oxygen  Cardiovascular: Regular rate Abdomen: Left nephrostomy tube adequately secured with a suture. Respiratory: Normal respiratory effort Neurologic: Grossly intact, no focal deficits Psychiatric: Normal mood and affect  I have reviewed prior pt notes  I have independently reviewed prior imaging--IR notes  I have reviewed prior CT scan images--his most recent CT showed stability in terms of size of the left renal stone  Most recent hospital notes reviewed  Most recent neph. tube change notes reviewed  His nephrostomy tube was irrigated today with sterile saline.  No significant blood, but some  sediment he came out with the irrigant.

## 2024-04-19 ENCOUNTER — Other Ambulatory Visit (HOSPITAL_COMMUNITY)

## 2024-04-19 ENCOUNTER — Ambulatory Visit (INDEPENDENT_AMBULATORY_CARE_PROVIDER_SITE_OTHER): Admitting: Urology

## 2024-04-19 ENCOUNTER — Encounter: Payer: Self-pay | Admitting: Urology

## 2024-04-19 VITALS — BP 167/72 | HR 93

## 2024-04-19 DIAGNOSIS — N261 Atrophy of kidney (terminal): Secondary | ICD-10-CM | POA: Diagnosis not present

## 2024-04-19 DIAGNOSIS — Z436 Encounter for attention to other artificial openings of urinary tract: Secondary | ICD-10-CM

## 2024-04-19 DIAGNOSIS — Z435 Encounter for attention to cystostomy: Secondary | ICD-10-CM

## 2024-04-19 DIAGNOSIS — N1330 Unspecified hydronephrosis: Secondary | ICD-10-CM

## 2024-04-19 DIAGNOSIS — M4628 Osteomyelitis of vertebra, sacral and sacrococcygeal region: Secondary | ICD-10-CM | POA: Diagnosis not present

## 2024-04-19 DIAGNOSIS — M6281 Muscle weakness (generalized): Secondary | ICD-10-CM | POA: Diagnosis not present

## 2024-04-19 DIAGNOSIS — N312 Flaccid neuropathic bladder, not elsewhere classified: Secondary | ICD-10-CM | POA: Diagnosis not present

## 2024-04-19 DIAGNOSIS — R1312 Dysphagia, oropharyngeal phase: Secondary | ICD-10-CM | POA: Diagnosis not present

## 2024-04-19 DIAGNOSIS — G35 Multiple sclerosis: Secondary | ICD-10-CM | POA: Diagnosis not present

## 2024-04-19 DIAGNOSIS — R339 Retention of urine, unspecified: Secondary | ICD-10-CM | POA: Diagnosis not present

## 2024-04-19 DIAGNOSIS — Z9359 Other cystostomy status: Secondary | ICD-10-CM

## 2024-04-19 DIAGNOSIS — N200 Calculus of kidney: Secondary | ICD-10-CM

## 2024-04-19 DIAGNOSIS — J189 Pneumonia, unspecified organism: Secondary | ICD-10-CM | POA: Diagnosis not present

## 2024-04-19 DIAGNOSIS — R278 Other lack of coordination: Secondary | ICD-10-CM | POA: Diagnosis not present

## 2024-04-19 NOTE — Progress Notes (Signed)
 Suprapubic Cath Change  Patient is present today for a suprapubic catheter change due to urinary retention.  10ml of water  was drained from the balloon, a 24FR foley cath was removed from the tract with out difficulty.  Suprapubic catheter site was cleaned and prepped in a sterile fashion with [id number]  A 24FR foley cath was replaced into the tract no complications were noted. Urine return was noted, urine Clear yellow in color . 10 ml of sterile water  was inflated into the balloon and a bedside bag bag was attached for drainage.  Patient tolerated well.  Performed by: Ellyssa Zagal LPN  Follow up: 4 weeks

## 2024-04-20 DIAGNOSIS — E440 Moderate protein-calorie malnutrition: Secondary | ICD-10-CM | POA: Diagnosis not present

## 2024-04-20 DIAGNOSIS — G35 Multiple sclerosis: Secondary | ICD-10-CM | POA: Diagnosis not present

## 2024-04-20 DIAGNOSIS — R278 Other lack of coordination: Secondary | ICD-10-CM | POA: Diagnosis not present

## 2024-04-20 DIAGNOSIS — M4628 Osteomyelitis of vertebra, sacral and sacrococcygeal region: Secondary | ICD-10-CM | POA: Diagnosis not present

## 2024-04-20 DIAGNOSIS — F419 Anxiety disorder, unspecified: Secondary | ICD-10-CM | POA: Diagnosis not present

## 2024-04-20 DIAGNOSIS — R1312 Dysphagia, oropharyngeal phase: Secondary | ICD-10-CM | POA: Diagnosis not present

## 2024-04-20 DIAGNOSIS — M6281 Muscle weakness (generalized): Secondary | ICD-10-CM | POA: Diagnosis not present

## 2024-04-20 DIAGNOSIS — N319 Neuromuscular dysfunction of bladder, unspecified: Secondary | ICD-10-CM | POA: Diagnosis not present

## 2024-04-20 DIAGNOSIS — J189 Pneumonia, unspecified organism: Secondary | ICD-10-CM | POA: Diagnosis not present

## 2024-04-21 DIAGNOSIS — R1312 Dysphagia, oropharyngeal phase: Secondary | ICD-10-CM | POA: Diagnosis not present

## 2024-04-21 DIAGNOSIS — R278 Other lack of coordination: Secondary | ICD-10-CM | POA: Diagnosis not present

## 2024-04-21 DIAGNOSIS — M6281 Muscle weakness (generalized): Secondary | ICD-10-CM | POA: Diagnosis not present

## 2024-04-21 DIAGNOSIS — M4628 Osteomyelitis of vertebra, sacral and sacrococcygeal region: Secondary | ICD-10-CM | POA: Diagnosis not present

## 2024-04-21 DIAGNOSIS — G35 Multiple sclerosis: Secondary | ICD-10-CM | POA: Diagnosis not present

## 2024-04-21 DIAGNOSIS — J189 Pneumonia, unspecified organism: Secondary | ICD-10-CM | POA: Diagnosis not present

## 2024-04-22 DIAGNOSIS — M6281 Muscle weakness (generalized): Secondary | ICD-10-CM | POA: Diagnosis not present

## 2024-04-22 DIAGNOSIS — L89896 Pressure-induced deep tissue damage of other site: Secondary | ICD-10-CM | POA: Diagnosis not present

## 2024-04-22 DIAGNOSIS — L89623 Pressure ulcer of left heel, stage 3: Secondary | ICD-10-CM | POA: Diagnosis not present

## 2024-04-22 DIAGNOSIS — M4628 Osteomyelitis of vertebra, sacral and sacrococcygeal region: Secondary | ICD-10-CM | POA: Diagnosis not present

## 2024-04-22 DIAGNOSIS — J189 Pneumonia, unspecified organism: Secondary | ICD-10-CM | POA: Diagnosis not present

## 2024-04-22 DIAGNOSIS — L89616 Pressure-induced deep tissue damage of right heel: Secondary | ICD-10-CM | POA: Diagnosis not present

## 2024-04-22 DIAGNOSIS — R159 Full incontinence of feces: Secondary | ICD-10-CM | POA: Diagnosis not present

## 2024-04-22 DIAGNOSIS — J449 Chronic obstructive pulmonary disease, unspecified: Secondary | ICD-10-CM | POA: Diagnosis not present

## 2024-04-22 DIAGNOSIS — E440 Moderate protein-calorie malnutrition: Secondary | ICD-10-CM | POA: Diagnosis not present

## 2024-04-22 DIAGNOSIS — L89314 Pressure ulcer of right buttock, stage 4: Secondary | ICD-10-CM | POA: Diagnosis not present

## 2024-04-22 DIAGNOSIS — J8410 Pulmonary fibrosis, unspecified: Secondary | ICD-10-CM | POA: Diagnosis not present

## 2024-04-22 DIAGNOSIS — R1312 Dysphagia, oropharyngeal phase: Secondary | ICD-10-CM | POA: Diagnosis not present

## 2024-04-22 DIAGNOSIS — I679 Cerebrovascular disease, unspecified: Secondary | ICD-10-CM | POA: Diagnosis not present

## 2024-04-22 DIAGNOSIS — R278 Other lack of coordination: Secondary | ICD-10-CM | POA: Diagnosis not present

## 2024-04-22 DIAGNOSIS — L89154 Pressure ulcer of sacral region, stage 4: Secondary | ICD-10-CM | POA: Diagnosis not present

## 2024-04-22 DIAGNOSIS — L89323 Pressure ulcer of left buttock, stage 3: Secondary | ICD-10-CM | POA: Diagnosis not present

## 2024-04-22 DIAGNOSIS — G894 Chronic pain syndrome: Secondary | ICD-10-CM | POA: Diagnosis not present

## 2024-04-22 DIAGNOSIS — G35 Multiple sclerosis: Secondary | ICD-10-CM | POA: Diagnosis not present

## 2024-04-22 DIAGNOSIS — L89510 Pressure ulcer of right ankle, unstageable: Secondary | ICD-10-CM | POA: Diagnosis not present

## 2024-04-22 DIAGNOSIS — J439 Emphysema, unspecified: Secondary | ICD-10-CM | POA: Diagnosis not present

## 2024-04-25 DIAGNOSIS — M4628 Osteomyelitis of vertebra, sacral and sacrococcygeal region: Secondary | ICD-10-CM | POA: Diagnosis not present

## 2024-04-25 DIAGNOSIS — M6281 Muscle weakness (generalized): Secondary | ICD-10-CM | POA: Diagnosis not present

## 2024-04-25 DIAGNOSIS — J189 Pneumonia, unspecified organism: Secondary | ICD-10-CM | POA: Diagnosis not present

## 2024-04-25 DIAGNOSIS — R1312 Dysphagia, oropharyngeal phase: Secondary | ICD-10-CM | POA: Diagnosis not present

## 2024-04-25 DIAGNOSIS — G35 Multiple sclerosis: Secondary | ICD-10-CM | POA: Diagnosis not present

## 2024-04-25 DIAGNOSIS — R278 Other lack of coordination: Secondary | ICD-10-CM | POA: Diagnosis not present

## 2024-04-26 DIAGNOSIS — E440 Moderate protein-calorie malnutrition: Secondary | ICD-10-CM | POA: Diagnosis not present

## 2024-04-26 DIAGNOSIS — R278 Other lack of coordination: Secondary | ICD-10-CM | POA: Diagnosis not present

## 2024-04-26 DIAGNOSIS — G35 Multiple sclerosis: Secondary | ICD-10-CM | POA: Diagnosis not present

## 2024-04-26 DIAGNOSIS — M6281 Muscle weakness (generalized): Secondary | ICD-10-CM | POA: Diagnosis not present

## 2024-04-26 DIAGNOSIS — J189 Pneumonia, unspecified organism: Secondary | ICD-10-CM | POA: Diagnosis not present

## 2024-04-26 DIAGNOSIS — R1312 Dysphagia, oropharyngeal phase: Secondary | ICD-10-CM | POA: Diagnosis not present

## 2024-04-26 DIAGNOSIS — M4628 Osteomyelitis of vertebra, sacral and sacrococcygeal region: Secondary | ICD-10-CM | POA: Diagnosis not present

## 2024-04-27 DIAGNOSIS — J189 Pneumonia, unspecified organism: Secondary | ICD-10-CM | POA: Diagnosis not present

## 2024-04-27 DIAGNOSIS — M6281 Muscle weakness (generalized): Secondary | ICD-10-CM | POA: Diagnosis not present

## 2024-04-27 DIAGNOSIS — R1312 Dysphagia, oropharyngeal phase: Secondary | ICD-10-CM | POA: Diagnosis not present

## 2024-04-27 DIAGNOSIS — R278 Other lack of coordination: Secondary | ICD-10-CM | POA: Diagnosis not present

## 2024-04-27 DIAGNOSIS — M4628 Osteomyelitis of vertebra, sacral and sacrococcygeal region: Secondary | ICD-10-CM | POA: Diagnosis not present

## 2024-04-27 DIAGNOSIS — G35 Multiple sclerosis: Secondary | ICD-10-CM | POA: Diagnosis not present

## 2024-04-28 ENCOUNTER — Other Ambulatory Visit (HOSPITAL_COMMUNITY): Payer: Self-pay | Admitting: Radiology

## 2024-04-28 ENCOUNTER — Ambulatory Visit (HOSPITAL_COMMUNITY)
Admission: RE | Admit: 2024-04-28 | Discharge: 2024-04-28 | Disposition: A | Discharge status: Home / Self Care | Source: Ambulatory Visit | Attending: Radiology | Admitting: Radiology

## 2024-04-28 DIAGNOSIS — R1312 Dysphagia, oropharyngeal phase: Secondary | ICD-10-CM | POA: Diagnosis not present

## 2024-04-28 DIAGNOSIS — J189 Pneumonia, unspecified organism: Secondary | ICD-10-CM | POA: Diagnosis not present

## 2024-04-28 DIAGNOSIS — R278 Other lack of coordination: Secondary | ICD-10-CM | POA: Diagnosis not present

## 2024-04-28 DIAGNOSIS — N319 Neuromuscular dysfunction of bladder, unspecified: Secondary | ICD-10-CM | POA: Diagnosis present

## 2024-04-28 DIAGNOSIS — M6281 Muscle weakness (generalized): Secondary | ICD-10-CM | POA: Diagnosis not present

## 2024-04-28 DIAGNOSIS — M4628 Osteomyelitis of vertebra, sacral and sacrococcygeal region: Secondary | ICD-10-CM | POA: Diagnosis not present

## 2024-04-28 DIAGNOSIS — G35 Multiple sclerosis: Secondary | ICD-10-CM | POA: Diagnosis not present

## 2024-04-28 DIAGNOSIS — Z436 Encounter for attention to other artificial openings of urinary tract: Secondary | ICD-10-CM | POA: Diagnosis not present

## 2024-04-28 HISTORY — PX: IR NEPHROSTOMY EXCHANGE LEFT: IMG6069

## 2024-04-28 MED ORDER — LIDOCAINE HCL 1 % IJ SOLN
10.0000 mL | Freq: Once | INTRAMUSCULAR | Status: AC
  Administered 2024-04-28: 2 mL via INTRADERMAL

## 2024-04-28 MED ORDER — LIDOCAINE HCL 1 % IJ SOLN
INTRAMUSCULAR | Status: AC
  Filled 2024-04-28: qty 20

## 2024-04-28 MED ORDER — IOHEXOL 300 MG/ML  SOLN
50.0000 mL | Freq: Once | INTRAMUSCULAR | Status: AC | PRN
  Administered 2024-04-28: 10 mL

## 2024-04-29 DIAGNOSIS — L89896 Pressure-induced deep tissue damage of other site: Secondary | ICD-10-CM | POA: Diagnosis not present

## 2024-04-29 DIAGNOSIS — J189 Pneumonia, unspecified organism: Secondary | ICD-10-CM | POA: Diagnosis not present

## 2024-04-29 DIAGNOSIS — L89510 Pressure ulcer of right ankle, unstageable: Secondary | ICD-10-CM | POA: Diagnosis not present

## 2024-04-29 DIAGNOSIS — G35 Multiple sclerosis: Secondary | ICD-10-CM | POA: Diagnosis not present

## 2024-04-29 DIAGNOSIS — G894 Chronic pain syndrome: Secondary | ICD-10-CM | POA: Diagnosis not present

## 2024-04-29 DIAGNOSIS — L89623 Pressure ulcer of left heel, stage 3: Secondary | ICD-10-CM | POA: Diagnosis not present

## 2024-04-29 DIAGNOSIS — R278 Other lack of coordination: Secondary | ICD-10-CM | POA: Diagnosis not present

## 2024-04-29 DIAGNOSIS — M6281 Muscle weakness (generalized): Secondary | ICD-10-CM | POA: Diagnosis not present

## 2024-04-29 DIAGNOSIS — R159 Full incontinence of feces: Secondary | ICD-10-CM | POA: Diagnosis not present

## 2024-04-29 DIAGNOSIS — L89314 Pressure ulcer of right buttock, stage 4: Secondary | ICD-10-CM | POA: Diagnosis not present

## 2024-04-29 DIAGNOSIS — L89154 Pressure ulcer of sacral region, stage 4: Secondary | ICD-10-CM | POA: Diagnosis not present

## 2024-04-29 DIAGNOSIS — R1312 Dysphagia, oropharyngeal phase: Secondary | ICD-10-CM | POA: Diagnosis not present

## 2024-04-29 DIAGNOSIS — M4628 Osteomyelitis of vertebra, sacral and sacrococcygeal region: Secondary | ICD-10-CM | POA: Diagnosis not present

## 2024-04-29 DIAGNOSIS — L89323 Pressure ulcer of left buttock, stage 3: Secondary | ICD-10-CM | POA: Diagnosis not present

## 2024-04-29 DIAGNOSIS — L89616 Pressure-induced deep tissue damage of right heel: Secondary | ICD-10-CM | POA: Diagnosis not present

## 2024-04-30 DIAGNOSIS — M6281 Muscle weakness (generalized): Secondary | ICD-10-CM | POA: Diagnosis not present

## 2024-04-30 DIAGNOSIS — J189 Pneumonia, unspecified organism: Secondary | ICD-10-CM | POA: Diagnosis not present

## 2024-04-30 DIAGNOSIS — R278 Other lack of coordination: Secondary | ICD-10-CM | POA: Diagnosis not present

## 2024-04-30 DIAGNOSIS — R1312 Dysphagia, oropharyngeal phase: Secondary | ICD-10-CM | POA: Diagnosis not present

## 2024-04-30 DIAGNOSIS — M4628 Osteomyelitis of vertebra, sacral and sacrococcygeal region: Secondary | ICD-10-CM | POA: Diagnosis not present

## 2024-04-30 DIAGNOSIS — G35 Multiple sclerosis: Secondary | ICD-10-CM | POA: Diagnosis not present

## 2024-05-02 DIAGNOSIS — R1312 Dysphagia, oropharyngeal phase: Secondary | ICD-10-CM | POA: Diagnosis not present

## 2024-05-02 DIAGNOSIS — L89154 Pressure ulcer of sacral region, stage 4: Secondary | ICD-10-CM | POA: Diagnosis not present

## 2024-05-02 DIAGNOSIS — R278 Other lack of coordination: Secondary | ICD-10-CM | POA: Diagnosis not present

## 2024-05-02 DIAGNOSIS — M6281 Muscle weakness (generalized): Secondary | ICD-10-CM | POA: Diagnosis not present

## 2024-05-02 DIAGNOSIS — J8410 Pulmonary fibrosis, unspecified: Secondary | ICD-10-CM | POA: Diagnosis not present

## 2024-05-02 DIAGNOSIS — J189 Pneumonia, unspecified organism: Secondary | ICD-10-CM | POA: Diagnosis not present

## 2024-05-02 DIAGNOSIS — G35 Multiple sclerosis: Secondary | ICD-10-CM | POA: Diagnosis not present

## 2024-05-02 DIAGNOSIS — M4628 Osteomyelitis of vertebra, sacral and sacrococcygeal region: Secondary | ICD-10-CM | POA: Diagnosis not present

## 2024-05-02 DIAGNOSIS — N184 Chronic kidney disease, stage 4 (severe): Secondary | ICD-10-CM | POA: Diagnosis not present

## 2024-05-03 ENCOUNTER — Other Ambulatory Visit (HOSPITAL_COMMUNITY)

## 2024-05-03 DIAGNOSIS — M6281 Muscle weakness (generalized): Secondary | ICD-10-CM | POA: Diagnosis not present

## 2024-05-03 DIAGNOSIS — J189 Pneumonia, unspecified organism: Secondary | ICD-10-CM | POA: Diagnosis not present

## 2024-05-03 DIAGNOSIS — R1312 Dysphagia, oropharyngeal phase: Secondary | ICD-10-CM | POA: Diagnosis not present

## 2024-05-03 DIAGNOSIS — R278 Other lack of coordination: Secondary | ICD-10-CM | POA: Diagnosis not present

## 2024-05-03 DIAGNOSIS — G35 Multiple sclerosis: Secondary | ICD-10-CM | POA: Diagnosis not present

## 2024-05-03 DIAGNOSIS — M25511 Pain in right shoulder: Secondary | ICD-10-CM | POA: Diagnosis not present

## 2024-05-03 DIAGNOSIS — M4628 Osteomyelitis of vertebra, sacral and sacrococcygeal region: Secondary | ICD-10-CM | POA: Diagnosis not present

## 2024-05-04 DIAGNOSIS — R278 Other lack of coordination: Secondary | ICD-10-CM | POA: Diagnosis not present

## 2024-05-04 DIAGNOSIS — M6281 Muscle weakness (generalized): Secondary | ICD-10-CM | POA: Diagnosis not present

## 2024-05-04 DIAGNOSIS — N184 Chronic kidney disease, stage 4 (severe): Secondary | ICD-10-CM | POA: Diagnosis not present

## 2024-05-04 DIAGNOSIS — R1312 Dysphagia, oropharyngeal phase: Secondary | ICD-10-CM | POA: Diagnosis not present

## 2024-05-04 DIAGNOSIS — L89314 Pressure ulcer of right buttock, stage 4: Secondary | ICD-10-CM | POA: Diagnosis not present

## 2024-05-04 DIAGNOSIS — L89616 Pressure-induced deep tissue damage of right heel: Secondary | ICD-10-CM | POA: Diagnosis not present

## 2024-05-04 DIAGNOSIS — M4628 Osteomyelitis of vertebra, sacral and sacrococcygeal region: Secondary | ICD-10-CM | POA: Diagnosis not present

## 2024-05-04 DIAGNOSIS — R159 Full incontinence of feces: Secondary | ICD-10-CM | POA: Diagnosis not present

## 2024-05-04 DIAGNOSIS — L89510 Pressure ulcer of right ankle, unstageable: Secondary | ICD-10-CM | POA: Diagnosis not present

## 2024-05-04 DIAGNOSIS — L89154 Pressure ulcer of sacral region, stage 4: Secondary | ICD-10-CM | POA: Diagnosis not present

## 2024-05-04 DIAGNOSIS — J8410 Pulmonary fibrosis, unspecified: Secondary | ICD-10-CM | POA: Diagnosis not present

## 2024-05-04 DIAGNOSIS — G894 Chronic pain syndrome: Secondary | ICD-10-CM | POA: Diagnosis not present

## 2024-05-04 DIAGNOSIS — L89323 Pressure ulcer of left buttock, stage 3: Secondary | ICD-10-CM | POA: Diagnosis not present

## 2024-05-04 DIAGNOSIS — L89896 Pressure-induced deep tissue damage of other site: Secondary | ICD-10-CM | POA: Diagnosis not present

## 2024-05-04 DIAGNOSIS — J189 Pneumonia, unspecified organism: Secondary | ICD-10-CM | POA: Diagnosis not present

## 2024-05-04 DIAGNOSIS — L89623 Pressure ulcer of left heel, stage 3: Secondary | ICD-10-CM | POA: Diagnosis not present

## 2024-05-04 DIAGNOSIS — G35 Multiple sclerosis: Secondary | ICD-10-CM | POA: Diagnosis not present

## 2024-05-05 ENCOUNTER — Ambulatory Visit

## 2024-05-05 DIAGNOSIS — R278 Other lack of coordination: Secondary | ICD-10-CM | POA: Diagnosis not present

## 2024-05-05 DIAGNOSIS — M4628 Osteomyelitis of vertebra, sacral and sacrococcygeal region: Secondary | ICD-10-CM | POA: Diagnosis not present

## 2024-05-05 DIAGNOSIS — G35 Multiple sclerosis: Secondary | ICD-10-CM | POA: Diagnosis not present

## 2024-05-05 DIAGNOSIS — J189 Pneumonia, unspecified organism: Secondary | ICD-10-CM | POA: Diagnosis not present

## 2024-05-05 DIAGNOSIS — R1312 Dysphagia, oropharyngeal phase: Secondary | ICD-10-CM | POA: Diagnosis not present

## 2024-05-05 DIAGNOSIS — M6281 Muscle weakness (generalized): Secondary | ICD-10-CM | POA: Diagnosis not present

## 2024-05-06 DIAGNOSIS — G35 Multiple sclerosis: Secondary | ICD-10-CM | POA: Diagnosis not present

## 2024-05-06 DIAGNOSIS — M6281 Muscle weakness (generalized): Secondary | ICD-10-CM | POA: Diagnosis not present

## 2024-05-06 DIAGNOSIS — M4628 Osteomyelitis of vertebra, sacral and sacrococcygeal region: Secondary | ICD-10-CM | POA: Diagnosis not present

## 2024-05-06 DIAGNOSIS — R278 Other lack of coordination: Secondary | ICD-10-CM | POA: Diagnosis not present

## 2024-05-06 DIAGNOSIS — J189 Pneumonia, unspecified organism: Secondary | ICD-10-CM | POA: Diagnosis not present

## 2024-05-06 DIAGNOSIS — R1312 Dysphagia, oropharyngeal phase: Secondary | ICD-10-CM | POA: Diagnosis not present

## 2024-05-07 DIAGNOSIS — M4628 Osteomyelitis of vertebra, sacral and sacrococcygeal region: Secondary | ICD-10-CM | POA: Diagnosis not present

## 2024-05-07 DIAGNOSIS — M6281 Muscle weakness (generalized): Secondary | ICD-10-CM | POA: Diagnosis not present

## 2024-05-07 DIAGNOSIS — R1312 Dysphagia, oropharyngeal phase: Secondary | ICD-10-CM | POA: Diagnosis not present

## 2024-05-07 DIAGNOSIS — R278 Other lack of coordination: Secondary | ICD-10-CM | POA: Diagnosis not present

## 2024-05-07 DIAGNOSIS — J189 Pneumonia, unspecified organism: Secondary | ICD-10-CM | POA: Diagnosis not present

## 2024-05-07 DIAGNOSIS — G35 Multiple sclerosis: Secondary | ICD-10-CM | POA: Diagnosis not present

## 2024-05-09 DIAGNOSIS — J189 Pneumonia, unspecified organism: Secondary | ICD-10-CM | POA: Diagnosis not present

## 2024-05-09 DIAGNOSIS — R278 Other lack of coordination: Secondary | ICD-10-CM | POA: Diagnosis not present

## 2024-05-09 DIAGNOSIS — M4628 Osteomyelitis of vertebra, sacral and sacrococcygeal region: Secondary | ICD-10-CM | POA: Diagnosis not present

## 2024-05-09 DIAGNOSIS — R1312 Dysphagia, oropharyngeal phase: Secondary | ICD-10-CM | POA: Diagnosis not present

## 2024-05-09 DIAGNOSIS — M6281 Muscle weakness (generalized): Secondary | ICD-10-CM | POA: Diagnosis not present

## 2024-05-09 DIAGNOSIS — G35 Multiple sclerosis: Secondary | ICD-10-CM | POA: Diagnosis not present

## 2024-05-10 DIAGNOSIS — J189 Pneumonia, unspecified organism: Secondary | ICD-10-CM | POA: Diagnosis not present

## 2024-05-10 DIAGNOSIS — M6281 Muscle weakness (generalized): Secondary | ICD-10-CM | POA: Diagnosis not present

## 2024-05-10 DIAGNOSIS — R1312 Dysphagia, oropharyngeal phase: Secondary | ICD-10-CM | POA: Diagnosis not present

## 2024-05-10 DIAGNOSIS — R278 Other lack of coordination: Secondary | ICD-10-CM | POA: Diagnosis not present

## 2024-05-10 DIAGNOSIS — M4628 Osteomyelitis of vertebra, sacral and sacrococcygeal region: Secondary | ICD-10-CM | POA: Diagnosis not present

## 2024-05-10 DIAGNOSIS — G35 Multiple sclerosis: Secondary | ICD-10-CM | POA: Diagnosis not present

## 2024-05-11 ENCOUNTER — Telehealth: Payer: Self-pay

## 2024-05-11 DIAGNOSIS — J8410 Pulmonary fibrosis, unspecified: Secondary | ICD-10-CM | POA: Diagnosis not present

## 2024-05-11 DIAGNOSIS — J189 Pneumonia, unspecified organism: Secondary | ICD-10-CM | POA: Diagnosis not present

## 2024-05-11 DIAGNOSIS — R1312 Dysphagia, oropharyngeal phase: Secondary | ICD-10-CM | POA: Diagnosis not present

## 2024-05-11 DIAGNOSIS — N184 Chronic kidney disease, stage 4 (severe): Secondary | ICD-10-CM | POA: Diagnosis not present

## 2024-05-11 DIAGNOSIS — M4628 Osteomyelitis of vertebra, sacral and sacrococcygeal region: Secondary | ICD-10-CM | POA: Diagnosis not present

## 2024-05-11 DIAGNOSIS — G35 Multiple sclerosis: Secondary | ICD-10-CM | POA: Diagnosis not present

## 2024-05-11 DIAGNOSIS — M6281 Muscle weakness (generalized): Secondary | ICD-10-CM | POA: Diagnosis not present

## 2024-05-11 DIAGNOSIS — L89154 Pressure ulcer of sacral region, stage 4: Secondary | ICD-10-CM | POA: Diagnosis not present

## 2024-05-11 DIAGNOSIS — R278 Other lack of coordination: Secondary | ICD-10-CM | POA: Diagnosis not present

## 2024-05-11 NOTE — Telephone Encounter (Signed)
 Spoke to patient's wife and made her aware pt appointment needed to be rescheduled. Pt appointment has been moved.

## 2024-05-12 ENCOUNTER — Telehealth: Payer: Self-pay

## 2024-05-12 DIAGNOSIS — G35 Multiple sclerosis: Secondary | ICD-10-CM | POA: Diagnosis not present

## 2024-05-12 DIAGNOSIS — F32A Depression, unspecified: Secondary | ICD-10-CM | POA: Diagnosis not present

## 2024-05-12 DIAGNOSIS — F419 Anxiety disorder, unspecified: Secondary | ICD-10-CM | POA: Diagnosis not present

## 2024-05-12 DIAGNOSIS — F5102 Adjustment insomnia: Secondary | ICD-10-CM | POA: Diagnosis not present

## 2024-05-12 DIAGNOSIS — M6281 Muscle weakness (generalized): Secondary | ICD-10-CM | POA: Diagnosis not present

## 2024-05-12 DIAGNOSIS — J189 Pneumonia, unspecified organism: Secondary | ICD-10-CM | POA: Diagnosis not present

## 2024-05-12 DIAGNOSIS — R278 Other lack of coordination: Secondary | ICD-10-CM | POA: Diagnosis not present

## 2024-05-12 DIAGNOSIS — R1312 Dysphagia, oropharyngeal phase: Secondary | ICD-10-CM | POA: Diagnosis not present

## 2024-05-12 DIAGNOSIS — M4628 Osteomyelitis of vertebra, sacral and sacrococcygeal region: Secondary | ICD-10-CM | POA: Diagnosis not present

## 2024-05-12 NOTE — Telephone Encounter (Signed)
 Patient's wife is made aware of appointment rescheduled and voiced understanding.

## 2024-05-13 DIAGNOSIS — L89323 Pressure ulcer of left buttock, stage 3: Secondary | ICD-10-CM | POA: Diagnosis not present

## 2024-05-13 DIAGNOSIS — M4628 Osteomyelitis of vertebra, sacral and sacrococcygeal region: Secondary | ICD-10-CM | POA: Diagnosis not present

## 2024-05-13 DIAGNOSIS — G894 Chronic pain syndrome: Secondary | ICD-10-CM | POA: Diagnosis not present

## 2024-05-13 DIAGNOSIS — L89510 Pressure ulcer of right ankle, unstageable: Secondary | ICD-10-CM | POA: Diagnosis not present

## 2024-05-13 DIAGNOSIS — L89314 Pressure ulcer of right buttock, stage 4: Secondary | ICD-10-CM | POA: Diagnosis not present

## 2024-05-13 DIAGNOSIS — E440 Moderate protein-calorie malnutrition: Secondary | ICD-10-CM | POA: Diagnosis not present

## 2024-05-13 DIAGNOSIS — J449 Chronic obstructive pulmonary disease, unspecified: Secondary | ICD-10-CM | POA: Diagnosis not present

## 2024-05-13 DIAGNOSIS — J8410 Pulmonary fibrosis, unspecified: Secondary | ICD-10-CM | POA: Diagnosis not present

## 2024-05-13 DIAGNOSIS — J439 Emphysema, unspecified: Secondary | ICD-10-CM | POA: Diagnosis not present

## 2024-05-13 DIAGNOSIS — R1312 Dysphagia, oropharyngeal phase: Secondary | ICD-10-CM | POA: Diagnosis not present

## 2024-05-13 DIAGNOSIS — L89154 Pressure ulcer of sacral region, stage 4: Secondary | ICD-10-CM | POA: Diagnosis not present

## 2024-05-13 DIAGNOSIS — L89620 Pressure ulcer of left heel, unstageable: Secondary | ICD-10-CM | POA: Diagnosis not present

## 2024-05-13 DIAGNOSIS — R159 Full incontinence of feces: Secondary | ICD-10-CM | POA: Diagnosis not present

## 2024-05-13 DIAGNOSIS — M6281 Muscle weakness (generalized): Secondary | ICD-10-CM | POA: Diagnosis not present

## 2024-05-13 DIAGNOSIS — J189 Pneumonia, unspecified organism: Secondary | ICD-10-CM | POA: Diagnosis not present

## 2024-05-13 DIAGNOSIS — G35 Multiple sclerosis: Secondary | ICD-10-CM | POA: Diagnosis not present

## 2024-05-13 DIAGNOSIS — I679 Cerebrovascular disease, unspecified: Secondary | ICD-10-CM | POA: Diagnosis not present

## 2024-05-13 DIAGNOSIS — L89616 Pressure-induced deep tissue damage of right heel: Secondary | ICD-10-CM | POA: Diagnosis not present

## 2024-05-13 DIAGNOSIS — R278 Other lack of coordination: Secondary | ICD-10-CM | POA: Diagnosis not present

## 2024-05-16 DIAGNOSIS — R1312 Dysphagia, oropharyngeal phase: Secondary | ICD-10-CM | POA: Diagnosis not present

## 2024-05-16 DIAGNOSIS — G35 Multiple sclerosis: Secondary | ICD-10-CM | POA: Diagnosis not present

## 2024-05-16 DIAGNOSIS — M4628 Osteomyelitis of vertebra, sacral and sacrococcygeal region: Secondary | ICD-10-CM | POA: Diagnosis not present

## 2024-05-16 DIAGNOSIS — R278 Other lack of coordination: Secondary | ICD-10-CM | POA: Diagnosis not present

## 2024-05-16 DIAGNOSIS — M6281 Muscle weakness (generalized): Secondary | ICD-10-CM | POA: Diagnosis not present

## 2024-05-16 DIAGNOSIS — J189 Pneumonia, unspecified organism: Secondary | ICD-10-CM | POA: Diagnosis not present

## 2024-05-16 NOTE — Progress Notes (Unsigned)
 [facility] [place]  ASSESSMENT / PLAN: Jordan Ward is a 83 y.o. male with atherosclerosis of native arteries of bilateral lower extremities; left decubitus heel ulceration.  Recommend:  Abstinence from all tobacco products. Blood glucose control with goal A1c < 7%. Blood pressure control with goal blood pressure < 130/80 mmHg. Lipid reduction therapy with goal LDL-C < 55 mg/dL. Aspirin  81mg  by mouth daily. Atorvastatin  40-80mg  PO QD (or other high intensity statin therapy).  Patient extremely frail; he is not a candidate for open revascularization.  Will check CT angiogram to evaluate candidacy for endovascular treatment of left lower extremity.  CHIEF COMPLAINT: Heel ulcer  HISTORY OF PRESENT ILLNESS: Jordan Ward is a 83 y.o. male referred to clinic for evaluation of left posterior heel wound.  This has been present for many months.  He has been getting wound care for this and a stage IV sacral decubitus ulcer for a long period of time.  He has a history of multiple sclerosis and neurogenic bladder for which he is dependent on suprapubic tube as well as a nephrostomy tube.  He has a large midline ventral hernia.  He is profoundly deconditioned.  He is minimally ambulatory, and is seen today in a wheelchair.  He is with his wife today who supplements the history.  We reviewed his noninvasive testing in detail.  We reviewed the danger of heel ulceration in the setting of peripheral arterial disease.   Past Medical History:  Diagnosis Date   Anemia    Arthritis    Back pain, chronic    Bilateral carotid bruits    C. difficile colitis 09/2011   CAD (coronary artery disease)    Carotid artery stenosis    Cerebrovascular disease    Cerebrovascular disease 08/17/2018   Colon polyps    Complication of cystostomy catheter, initial encounter (HCC) 04/20/2020   Dyslipidemia    Dysphagia 10/07/2011   Encephalopathy    Gait disorder    HA (headache)    High  grade dysplasia in colonic adenoma 09/2005   History of kidney stones    HTN (hypertension), malignant 10/06/2011   Hypernatremia    Hypokalemia    Hypothyroidism 10/08/2011   Insomnia    Junctional rhythm    Kidney stones    MS (multiple sclerosis) (HCC)    Nausea and vomiting 03/02/2020   Neuromuscular disorder (HCC)    MS   OSA (obstructive sleep apnea)    Paroxysmal atrial tachycardia (HCC)    Peripheral vascular disease (HCC)    Pneumonia 4 yrs ago   Pulmonary fibrosis (HCC) 10/06/2011   Pulmonary nodule 10/08/2011   PVD (peripheral vascular disease) (HCC)    Sacral ulcer (HCC)    Sleep apnea    cannot tolerate   Stroke (HCC)    left sided weakness   Suprapubic catheter (HCC)    TIA (transient ischemic attack)    Tremors of nervous system 10/08/2011   Urinary tract infection    Ventral hernia without obstruction or gangrene     Large 8X9cm ventral hernia with loss of domain. CT reads report as diastasis recti with herniation or diastasis recti.  Dr. Kallie, Surgery, reviewed CT with radiology and there is herniation with only hernia sac or peritoneum over the bowel and large separation of the rectus muslce (i.e. diastasis recti aka loss of domain).  No surgical intervention recommended given size, age, and health.     Past Surgical History:  Procedure Laterality Date  APPENDECTOMY  09/2005   at time of left hemicolectomy   BACK SURGERY  1976/1979   lower   BILIARY DILATION N/A 03/03/2020   Procedure: BILIARY DILATION;  Surgeon: Golda Claudis PENNER, MD;  [facility];  Service: Endoscopy;  Laterality: N/A;   CATARACT EXTRACTION W/PHACO Right 03/08/2018   Procedure: CATARACT EXTRACTION PHACO AND INTRAOCULAR LENS PLACEMENT RIGHT EYE;  Surgeon: Perley Hamilton, MD;  Location: [facility];  Service: Ophthalmology;  Laterality: Right;  CDE: 8.86   CATARACT EXTRACTION W/PHACO Left 04/05/2018   Procedure: CATARACT EXTRACTION PHACO AND INTRAOCULAR LENS PLACEMENT (IOC);  Surgeon:  Perley Hamilton, MD;  Location: [facility];  Service: Ophthalmology;  Laterality: Left;  CDE: 7.36   CENTRAL LINE INSERTION-TUNNELED Right 09/11/2020   Procedure: PLACEMENT OF TUNNELED CENTRAL LINE INTO JUGULAR VEIN;  Surgeon: Kallie Manuelita BROCKS, MD;  Location: [facility];  Service: General;  Laterality: Right;   CHOLECYSTECTOMY     Dr. Claudene   COLON SURGERY  09/2005   Fleishman: four tubular adenomas, large adenomatous polyp with HIGH GRADE dysplasia   COLONOSCOPY  11/2004   Dr. Lendon sessile polyp splenic flexure, 10mm sessile polyp desc colon, tubulovillous adenoma (bx not removed)   COLONOSCOPY  01/2005   poor prep, polyp could not be found   COLONOSCOPY  05/2005   with EMR, polypectomy Dr. Norleen Dec, bx showed high grade dysplasia, partially resected   COLONOSCOPY  09/2005   Dr. Dec, [place] ink tattooing, four villous colon polyp (3 had been missed on previous colonoscopies due to limitations of procedures   COLONOSCOPY  09/2006   normal TI, no polyps   COLONOSCOPY  10/2007   Dr. Randol distal mammillations, benign bx, normal TI, random bx neg for microscopic colitis   CYSTOSCOPY W/ URETERAL STENT PLACEMENT Left 09/05/2020   Procedure: CYSTOSCOPY WITH RETROGRADE PYELOGRAM/URETERAL STENT PLACEMENT;  Surgeon: Cam Morene ORN, MD;  Location: [facility];  Service: Urology;  Laterality: Left;   CYSTOSCOPY WITH LITHOLAPAXY N/A 07/27/2018   Procedure: CYSTOSCOPY WITH LITHOLAPAXY VIA  SUPRAPUBIC TUBE;  Surgeon: Matilda Senior, MD;  Location: [facility];  Service: Urology;  Laterality: N/A;   CYSTOSCOPY WITH RETROGRADE PYELOGRAM, URETEROSCOPY AND STENT PLACEMENT Left 06/09/2017   Procedure: CYSTOSCOPY WITH LEFT RETROGRADE PYELOGRAM, LEFT URETEROSCOPY, LEFT URETEROSCOPIC STONE EXTRACTION, LEFT URETERAL STENT PLACEMENT;  Surgeon: Matilda Senior, MD;  Location: [facility];  Service: Urology;  Laterality: Left;   CYSTOSCOPY/URETEROSCOPY/HOLMIUM LASER/STENT PLACEMENT Left 05/22/2020   Procedure:  CYSTOSCOPY/URETEROSCOPY/STENT PLACEMENT;  Surgeon: Matilda Senior, MD;  Location: [facility];  Service: Urology;  Laterality: Left;   ERCP N/A 03/03/2020   Procedure: ENDOSCOPIC RETROGRADE CHOLANGIOPANCREATOGRAPHY (ERCP);  Surgeon: Golda Claudis PENNER, MD;  [facility];  Service: Endoscopy;  Laterality: N/A;   ERCP N/A 04/20/2020   Procedure: ENDOSCOPIC RETROGRADE CHOLANGIOPANCREATOGRAPHY (ERCP);  Surgeon: Golda Claudis PENNER, MD;  [facility];  Service: Endoscopy;  Laterality: N/A;  to be done at 7:30am in OR   GASTROINTESTINAL STENT REMOVAL N/A 04/20/2020   Procedure: STENT REMOVAL;  Surgeon: Golda Claudis PENNER, MD;  [facility];  Service: Endoscopy;  Laterality: N/A;   INGUINAL HERNIA REPAIR  1971   bilateral   INSERTION OF SUPRAPUBIC CATHETER  06/09/2017   Procedure: EXCHANGE OF SUPRAPUBIC CATHETER;  Surgeon: Matilda Senior, MD;  Location: [facility];  Service: Urology;;   INSERTION OF SUPRAPUBIC CATHETER  05/22/2020   Procedure: SUPRAPUBIC CATHETER EXCHANGE;  Surgeon: Matilda Senior, MD;  Location: [facility];  Service: Urology;;   IR  NEPHROSTOMY EXCHANGE LEFT  03/25/2021   IR NEPHROSTOMY EXCHANGE LEFT  05/20/2021   IR NEPHROSTOMY EXCHANGE LEFT  07/13/2021   IR NEPHROSTOMY EXCHANGE LEFT  09/09/2021   IR NEPHROSTOMY EXCHANGE LEFT  11/04/2021   IR NEPHROSTOMY EXCHANGE LEFT  12/23/2021   IR NEPHROSTOMY EXCHANGE LEFT  02/17/2022   IR NEPHROSTOMY EXCHANGE LEFT  04/14/2022   IR NEPHROSTOMY EXCHANGE LEFT  06/09/2022   IR NEPHROSTOMY EXCHANGE LEFT  07/28/2022   IR NEPHROSTOMY EXCHANGE LEFT  09/09/2022   IR NEPHROSTOMY EXCHANGE LEFT  11/04/2022   IR NEPHROSTOMY EXCHANGE LEFT  12/30/2022   IR NEPHROSTOMY EXCHANGE LEFT  02/25/2023   IR NEPHROSTOMY EXCHANGE LEFT  03/31/2023   IR NEPHROSTOMY EXCHANGE LEFT  05/26/2023   IR NEPHROSTOMY EXCHANGE LEFT  07/21/2023   IR NEPHROSTOMY EXCHANGE LEFT  09/15/2023   IR NEPHROSTOMY EXCHANGE LEFT  12/22/2023   IR NEPHROSTOMY EXCHANGE LEFT  03/25/2024    IR NEPHROSTOMY EXCHANGE LEFT  04/28/2024   IR NEPHROSTOMY PLACEMENT LEFT  05/26/2017   IR NEPHROSTOMY PLACEMENT LEFT  05/02/2020   IR NEPHROSTOMY PLACEMENT LEFT  02/15/2021   IR NEPHROSTOMY PLACEMENT LEFT  02/22/2024   IR NEPHROSTOMY TUBE CHANGE  10/29/2023   IR URETERAL STENT PERC REMOVAL MOD SED  03/25/2021   KIDNEY STONE SURGERY  09/13/2015   LITHOTRIPSY N/A 03/03/2020   Procedure: MECHANICAL LITHOTRIPSY WITH REMOVAL OF MULTIPLE STONE FRAGMENTS;  Surgeon: Golda Claudis PENNER, MD;  [facility];  Service: Endoscopy;  Laterality: N/A;   NEPHROLITHOTOMY Left 09/13/2015   Procedure: LEFT PERCUTANEOUS NEPHROLITHOTOMY ;  Surgeon: Garnette Shack, MD;  Location: [facility];  Service: Urology;  Laterality: Left;   NEPHROSTOMY TUBE REMOVAL  05/22/2020   Procedure: NEPHROSTOMY TUBE REMOVAL;  Surgeon: Shack Garnette, MD;  Location: [facility];  Service: Urology;;   [facility] REMOVAL Right 09/20/2020   Procedure: MINOR REMOVAL CENTRAL LINE;  Surgeon: Kallie Manuelita BROCKS, MD;  Location: [facility];  Service: General;  Laterality: Right;  Pt to arrive at 7:30am for procedure   REMOVAL OF STONES N/A 04/20/2020   Procedure: REMOVAL OF STONES;  Surgeon: Golda Claudis PENNER, MD;  [facility];  Service: Endoscopy;  Laterality: N/A;   SPHINCTEROTOMY  03/03/2020   Procedure: BILLARY SPHINCTEROTOMY;  Surgeon: Golda Claudis PENNER, MD;  [facility];  Service: Endoscopy;;   SUPRAPUBIC CATHETER INSERTION      Family History  Problem Relation Age of Onset   Cirrhosis Brother        etoh   Stroke Mother 24   Coronary artery disease Father 40   Heart attack Brother    Cancer Sister    Multiple sclerosis Other    Colon cancer Neg Hx     Social History   Socioeconomic History   Marital status: Married    Spouse name: Idell Caldron   Number of children: 2   Years of education: 10   Highest education level: Not on file  Occupational History   Occupation: disability    Comment: [employer]     Employer: RETIRED  Tobacco Use   Smoking status: Former    Current packs/day: 0.00    Average packs/day: 1 pack/day for 25.0 years (25.0 ttl pk-yrs)    Types: Cigarettes    Start date: 03/04/1963    Quit date: 03/03/1988    Years since quitting: 36.2   Smokeless tobacco: Never  Vaping Use   Vaping status: Never Used  Substance and Sexual Activity   Alcohol  use:  No   Drug use: No   Sexual activity: Not on file  Other Topics Concern   Not on file  Social History Narrative   Patient is married Catherne Caldron) and  Lives w/ wife and daughter Reena   Drinks 1-2 cups caffeine daily    Patient is left handed.    Social Drivers of Corporate investment banker Strain: Low Risk  (11/03/2023)   Received from [facility]   Overall Financial Resource Strain (CARDIA)    Difficulty of Paying Living Expenses: Not very hard  Food Insecurity: No Food Insecurity (01/30/2024)   Hunger Vital Sign    Worried About Running Out of Food in the Last Year: Never true    Ran Out of Food in the Last Year: Never true  Recent Concern: Food Insecurity - Food Insecurity Present (11/03/2023)   Received from [facility]   Hunger Vital Sign    Within the past 12 months, you worried that your food would run out before you got the money to buy more.: Sometimes true    Within the past 12 months, the food you bought just didn't last and you didn't have money to get more.: Sometimes true  Transportation Needs: No Transportation Needs (01/30/2024)   PRAPARE - Administrator, Civil Service (Medical): No    Lack of Transportation (Non-Medical): No  Physical Activity: Inactive (11/03/2023)   Received from [facility]   Exercise Vital Sign    On average, how many days per week do you engage in moderate to strenuous exercise (like a brisk walk)?: 0 days    On average, how many minutes do you engage in exercise at this level?: 0 min  Stress: No Stress Concern Present (11/03/2023)   Received from [facility] - Occupational Stress Questionnaire    Feeling of Stress : Only a little  Social Connections: Moderately Isolated (01/30/2024)   Social Connection and Isolation Panel    Frequency of Communication with Friends and Family: More than three times a week    Frequency of Social Gatherings with Friends and Family: Once a week    Attends Religious Services: Never    Database administrator or Organizations: No    Attends Banker Meetings: Never    Marital Status: Married  Catering manager Violence: Not At Risk (01/30/2024)   Humiliation, Afraid, Rape, and Kick questionnaire    Fear of Current or Ex-Partner: No    Emotionally Abused: No    Physically Abused: No    Sexually Abused: No    Allergies  Allergen Reactions   Ciprofloxacin  Other (See Comments)    Trouble swallowing according to wife Clemens out in the floor Pt can tolerate Levaquin    Tetracyclines & Related Anaphylaxis and Rash    Current Outpatient Medications  Medication Sig Dispense Refill   acetaminophen  (TYLENOL ) 325 MG tablet Take 650 mg by mouth every 4 (four) hours as needed for mild pain (pain score 1-3), moderate pain (pain score 4-6) or fever.     Amino Acids-Protein Hydrolys (PRO-STAT PROFILE PO) Take 30 mLs by mouth 2 (two) times daily.     amLODipine  (NORVASC ) 5 MG tablet Take 1 tablet (5 mg total) by mouth daily. 30 tablet 5   ascorbic acid  (VITAMIN C) 500 MG tablet Take 500 mg by mouth every morning.     aspirin  81 MG chewable tablet Chew 1 tablet (81 mg  total) by mouth daily with breakfast. 30 tablet 11   busPIRone (BUSPAR) 5 MG tablet Take 5 mg by mouth 3 (three) times daily.     carboxymethylcellulose 1 % ophthalmic solution Place 2 drops into both eyes every 4 (four) hours as needed (dry eyes).     cephALEXin  (KEFLEX ) 500 MG capsule Take 1 capsule (500 mg total) by mouth 4 (four) times daily. 20 capsule 0   diazepam  (VALIUM ) 2 MG tablet Take 1 tablet (2 mg  total) by mouth 2 (two) times daily. 30 tablet 0   HYDROcodone -acetaminophen  (NORCO/VICODIN) 5-325 MG tablet Take 1 tablet by mouth every 8 (eight) hours as needed for moderate pain (pain score 4-6). 15 tablet 0   levofloxacin  (LEVAQUIN ) 500 MG tablet Take 1 tablet (500 mg total) by mouth See admin instructions. Every other day (Q 48 hrs) -starting 02/03/24 (02/03/24, 05/09, 05/11, 05/13 and 02/11/24) 5 tablet 0   levothyroxine  (SYNTHROID ) 75 MCG tablet Take 75 mcg by mouth in the morning.     melatonin 3 MG TABS tablet Take 3 mg by mouth daily as needed (sleep).     methocarbamol  (ROBAXIN ) 500 MG tablet Take 500 mg by mouth every 6 (six) hours as needed for muscle spasms.     mirabegron  ER (MYRBETRIQ ) 50 MG TB24 tablet Take 1 tablet (50 mg total) by mouth daily. (Patient not taking: Reported on 02/02/2024) 30 tablet 2   mirtazapine  (REMERON ) 15 MG tablet Take 0.5 tablets (7.5 mg total) by mouth at bedtime. 15 tablet 1   Multiple Vitamin (MULTIVITAMIN) tablet Take 1 tablet by mouth every morning.     oxybutynin  (DITROPAN -XL) 5 MG 24 hr tablet Take 5 mg by mouth every morning.     OXYGEN  Inhale 1 L into the lungs every evening. (Patient not taking: Reported on 02/02/2024)     pantoprazole  (PROTONIX ) 40 MG tablet Take 1 tablet (40 mg total) by mouth daily. 30 tablet 2   polyethylene glycol (MIRALAX  / GLYCOLAX ) 17 g packet Take 17 g by mouth every morning.     Potassium Bicarb-Citric Acid 20 MEQ TBEF Take 20 mEq by mouth every morning.     senna-docusate (SENOKOT-S) 8.6-50 MG tablet Take 2 tablets by mouth at bedtime. 60 tablet 5   sodium chloride  (OCEAN) 0.65 % SOLN nasal spray Place 1 spray into both nostrils in the morning, at noon, in the evening, and at bedtime.     thiamine  (VITAMIN B-1) 100 MG tablet Take 1 tablet (100 mg total) by mouth daily. 30 tablet 0   No current facility-administered medications for this visit.    PHYSICAL EXAM There were no vitals filed for this visit.  Frail elderly  man in no distress Regular rate and rhythm Unlabored breathing No palpable pedal pulses Decubitus ulceration to fat layer in the left posterior heel.  PERTINENT LABORATORY AND RADIOLOGIC DATA  Most recent CBC    Latest Ref Rng & Units 02/21/2024   11:57 PM 02/02/2024    7:22 AM 01/31/2024    5:13 AM  CBC  WBC 4.0 - 10.5 K/uL 12.7  9.0  13.1   Hemoglobin 13.0 - 17.0 g/dL 88.9  8.7  9.4   Hematocrit 39.0 - 52.0 % 38.4  29.2  30.5   Platelets 150 - 400 K/uL 359  278  249      Most recent CMP    Latest Ref Rng & Units 02/22/2024    8:43 AM 02/21/2024   11:57 PM 02/21/2024  4:30 PM  CMP  Glucose 70 - 99 mg/dL 895  888  88   BUN 8 - 23 mg/dL 40  46  45   Creatinine 0.61 - 1.24 mg/dL 8.05  7.75  7.93   Sodium 135 - 145 mmol/L 139  139  141   Potassium 3.5 - 5.1 mmol/L 5.0  5.3  5.3   Chloride 98 - 111 mmol/L 111  107  109   CO2 22 - 32 mmol/L 22  21  21    Calcium  8.9 - 10.3 mg/dL 8.7  9.9  9.2   Total Protein 6.5 - 8.1 g/dL   7.5   Total Bilirubin 0.0 - 1.2 mg/dL   0.5   Alkaline Phos 38 - 126 U/L   84   AST 15 - 41 U/L   17   ALT 0 - 44 U/L   24     Renal function CrCl cannot be calculated (Patient's most recent lab result is older than the maximum 21 days allowed.).  Hgb A1c MFr Bld (%)  Date Value  09/30/2023 6.1 (H)    LDL Cholesterol  Date Value Ref Range Status  09/30/2023 16 0 - 99 mg/dL Final    Comment:           Total Cholesterol/HDL:CHD Risk Coronary Heart Disease Risk Table                     Men   Women  1/2 Average Risk   3.4   3.3  Average Risk       5.0   4.4  2 X Average Risk   9.6   7.1  3 X Average Risk  23.4   11.0        Use the calculated Patient Ratio above and the CHD Risk Table to determine the patient's CHD Risk.        ATP III CLASSIFICATION (LDL):  <100     mg/dL   Optimal  899-870  mg/dL   Near or Above                    Optimal  130-159  mg/dL   Borderline  839-810  mg/dL   High  >809     mg/dL   Very High Performed at  [facility], [address], [place], [place] [address]     CLINICAL DATA:  Left heel wound.  Right ankle wound.  EXAM: NONINVASIVE PHYSIOLOGIC VASCULAR STUDY OF BILATERAL LOWER EXTREMITIES  TECHNIQUE: Evaluation of both lower extremities were performed at rest, including calculation of ankle-brachial indices with single level Doppler, pressure and pulse volume recording.  COMPARISON:  None Available.  FINDINGS: Right ABI:  0.43  Left ABI:  0.47  Right Lower Extremity: Monophasic and biphasic waveforms at the right ankle.  Left Lower Extremity: Probable biphasic waveforms at the left ankle.  < 0.5 Severe PAD Procedure Note  Philip Juliene Motto, MD - 11/04/2023 Formatting of this note might be different from the original. CLINICAL DATA:  Left heel wound.  Right ankle wound.  EXAM: NONINVASIVE PHYSIOLOGIC VASCULAR STUDY OF BILATERAL LOWER EXTREMITIES  TECHNIQUE: Evaluation of both lower extremities were performed at rest, including calculation of ankle-brachial indices with single level Doppler, pressure and pulse volume recording.  COMPARISON:  None Available.  FINDINGS: Right ABI:  0.43  Left ABI:  0.47  Right Lower Extremity: Monophasic and biphasic waveforms at the right ankle.  Left Lower Extremity: Probable biphasic waveforms  at the left ankle.  < 0.5 Severe PAD  IMPRESSION: Severe arterial disease in both lower extremities.   Electronically Signed   By: Juliene Balder M.D.   On: 11/04/2023 13:02  Debby SAILOR. Magda, MD FACS [facility] Phone Number: [phone] 05/16/2024 8:54 PM   Total time spent on preparing this encounter including chart review, data review, collecting history, examining the patient, and coordinating care: 60 min  Portions of this report may have been transcribed using voice recognition software.  Every effort has been made to ensure accuracy; however, inadvertent computerized  transcription errors may still be present.

## 2024-05-17 ENCOUNTER — Encounter: Payer: Self-pay | Admitting: Vascular Surgery

## 2024-05-17 ENCOUNTER — Ambulatory Visit (INDEPENDENT_AMBULATORY_CARE_PROVIDER_SITE_OTHER): Admitting: Vascular Surgery

## 2024-05-17 VITALS — BP 115/75 | HR 86 | Ht 65.0 in | Wt 107.0 lb

## 2024-05-17 DIAGNOSIS — M6281 Muscle weakness (generalized): Secondary | ICD-10-CM | POA: Diagnosis not present

## 2024-05-17 DIAGNOSIS — M4628 Osteomyelitis of vertebra, sacral and sacrococcygeal region: Secondary | ICD-10-CM | POA: Diagnosis not present

## 2024-05-17 DIAGNOSIS — J189 Pneumonia, unspecified organism: Secondary | ICD-10-CM | POA: Diagnosis not present

## 2024-05-17 DIAGNOSIS — G35 Multiple sclerosis: Secondary | ICD-10-CM | POA: Diagnosis not present

## 2024-05-17 DIAGNOSIS — I7025 Atherosclerosis of native arteries of other extremities with ulceration: Secondary | ICD-10-CM | POA: Diagnosis not present

## 2024-05-17 DIAGNOSIS — R1312 Dysphagia, oropharyngeal phase: Secondary | ICD-10-CM | POA: Diagnosis not present

## 2024-05-17 DIAGNOSIS — R278 Other lack of coordination: Secondary | ICD-10-CM | POA: Diagnosis not present

## 2024-05-18 ENCOUNTER — Other Ambulatory Visit: Payer: Self-pay

## 2024-05-18 DIAGNOSIS — M6281 Muscle weakness (generalized): Secondary | ICD-10-CM | POA: Diagnosis not present

## 2024-05-18 DIAGNOSIS — M4628 Osteomyelitis of vertebra, sacral and sacrococcygeal region: Secondary | ICD-10-CM | POA: Diagnosis not present

## 2024-05-18 DIAGNOSIS — I7025 Atherosclerosis of native arteries of other extremities with ulceration: Secondary | ICD-10-CM

## 2024-05-18 DIAGNOSIS — R278 Other lack of coordination: Secondary | ICD-10-CM | POA: Diagnosis not present

## 2024-05-18 DIAGNOSIS — G35 Multiple sclerosis: Secondary | ICD-10-CM | POA: Diagnosis not present

## 2024-05-18 DIAGNOSIS — R1312 Dysphagia, oropharyngeal phase: Secondary | ICD-10-CM | POA: Diagnosis not present

## 2024-05-18 DIAGNOSIS — J189 Pneumonia, unspecified organism: Secondary | ICD-10-CM | POA: Diagnosis not present

## 2024-05-19 ENCOUNTER — Ambulatory Visit

## 2024-05-19 DIAGNOSIS — M6281 Muscle weakness (generalized): Secondary | ICD-10-CM | POA: Diagnosis not present

## 2024-05-19 DIAGNOSIS — J189 Pneumonia, unspecified organism: Secondary | ICD-10-CM | POA: Diagnosis not present

## 2024-05-19 DIAGNOSIS — G35 Multiple sclerosis: Secondary | ICD-10-CM | POA: Diagnosis not present

## 2024-05-19 DIAGNOSIS — R1312 Dysphagia, oropharyngeal phase: Secondary | ICD-10-CM | POA: Diagnosis not present

## 2024-05-19 DIAGNOSIS — M4628 Osteomyelitis of vertebra, sacral and sacrococcygeal region: Secondary | ICD-10-CM | POA: Diagnosis not present

## 2024-05-19 DIAGNOSIS — R278 Other lack of coordination: Secondary | ICD-10-CM | POA: Diagnosis not present

## 2024-05-20 ENCOUNTER — Ambulatory Visit (INDEPENDENT_AMBULATORY_CARE_PROVIDER_SITE_OTHER)

## 2024-05-20 DIAGNOSIS — L89154 Pressure ulcer of sacral region, stage 4: Secondary | ICD-10-CM | POA: Diagnosis not present

## 2024-05-20 DIAGNOSIS — M4628 Osteomyelitis of vertebra, sacral and sacrococcygeal region: Secondary | ICD-10-CM | POA: Diagnosis not present

## 2024-05-20 DIAGNOSIS — R1312 Dysphagia, oropharyngeal phase: Secondary | ICD-10-CM | POA: Diagnosis not present

## 2024-05-20 DIAGNOSIS — M6281 Muscle weakness (generalized): Secondary | ICD-10-CM | POA: Diagnosis not present

## 2024-05-20 DIAGNOSIS — I69391 Dysphagia following cerebral infarction: Secondary | ICD-10-CM | POA: Diagnosis not present

## 2024-05-20 DIAGNOSIS — L89616 Pressure-induced deep tissue damage of right heel: Secondary | ICD-10-CM | POA: Diagnosis not present

## 2024-05-20 DIAGNOSIS — R339 Retention of urine, unspecified: Secondary | ICD-10-CM

## 2024-05-20 DIAGNOSIS — R159 Full incontinence of feces: Secondary | ICD-10-CM | POA: Diagnosis not present

## 2024-05-20 DIAGNOSIS — L89620 Pressure ulcer of left heel, unstageable: Secondary | ICD-10-CM | POA: Diagnosis not present

## 2024-05-20 DIAGNOSIS — I129 Hypertensive chronic kidney disease with stage 1 through stage 4 chronic kidney disease, or unspecified chronic kidney disease: Secondary | ICD-10-CM | POA: Diagnosis not present

## 2024-05-20 DIAGNOSIS — R278 Other lack of coordination: Secondary | ICD-10-CM | POA: Diagnosis not present

## 2024-05-20 DIAGNOSIS — L89314 Pressure ulcer of right buttock, stage 4: Secondary | ICD-10-CM | POA: Diagnosis not present

## 2024-05-20 DIAGNOSIS — G35 Multiple sclerosis: Secondary | ICD-10-CM | POA: Diagnosis not present

## 2024-05-20 DIAGNOSIS — J189 Pneumonia, unspecified organism: Secondary | ICD-10-CM | POA: Diagnosis not present

## 2024-05-20 DIAGNOSIS — L89510 Pressure ulcer of right ankle, unstageable: Secondary | ICD-10-CM | POA: Diagnosis not present

## 2024-05-20 DIAGNOSIS — Z9359 Other cystostomy status: Secondary | ICD-10-CM

## 2024-05-20 DIAGNOSIS — L89323 Pressure ulcer of left buttock, stage 3: Secondary | ICD-10-CM | POA: Diagnosis not present

## 2024-05-20 DIAGNOSIS — G894 Chronic pain syndrome: Secondary | ICD-10-CM | POA: Diagnosis not present

## 2024-05-20 NOTE — Progress Notes (Signed)
 Suprapubic Cath Change  Patient is present today for a suprapubic catheter change due to urinary retention.  10 ml of water  was drained from the balloon, a 24 FR foley cath was removed from the tract with out difficulty.  Suprapubic catheter site was cleaned and prepped in a sterile fashion with [email or web address]  A 24 FR foley cath was replaced into the tract no complications were noted. Urine return was noted, urine Clear yellow in color . 10 ml of sterile water  was inflated into the balloon and a night bag was attached for drainage.  Patient tolerated well. A night bag was given to patient and proper instruction was given on how to switch bags.    Performed by: Carlos, CMA  Follow up: keep monthly nurse visited

## 2024-05-21 DIAGNOSIS — G35 Multiple sclerosis: Secondary | ICD-10-CM | POA: Diagnosis not present

## 2024-05-21 DIAGNOSIS — M4628 Osteomyelitis of vertebra, sacral and sacrococcygeal region: Secondary | ICD-10-CM | POA: Diagnosis not present

## 2024-05-21 DIAGNOSIS — J189 Pneumonia, unspecified organism: Secondary | ICD-10-CM | POA: Diagnosis not present

## 2024-05-21 DIAGNOSIS — M6281 Muscle weakness (generalized): Secondary | ICD-10-CM | POA: Diagnosis not present

## 2024-05-21 DIAGNOSIS — R278 Other lack of coordination: Secondary | ICD-10-CM | POA: Diagnosis not present

## 2024-05-21 DIAGNOSIS — R1312 Dysphagia, oropharyngeal phase: Secondary | ICD-10-CM | POA: Diagnosis not present

## 2024-05-23 DIAGNOSIS — G35 Multiple sclerosis: Secondary | ICD-10-CM | POA: Diagnosis not present

## 2024-05-23 DIAGNOSIS — R1312 Dysphagia, oropharyngeal phase: Secondary | ICD-10-CM | POA: Diagnosis not present

## 2024-05-23 DIAGNOSIS — M4628 Osteomyelitis of vertebra, sacral and sacrococcygeal region: Secondary | ICD-10-CM | POA: Diagnosis not present

## 2024-05-23 DIAGNOSIS — J189 Pneumonia, unspecified organism: Secondary | ICD-10-CM | POA: Diagnosis not present

## 2024-05-23 DIAGNOSIS — M6281 Muscle weakness (generalized): Secondary | ICD-10-CM | POA: Diagnosis not present

## 2024-05-23 DIAGNOSIS — R278 Other lack of coordination: Secondary | ICD-10-CM | POA: Diagnosis not present

## 2024-05-24 DIAGNOSIS — G35 Multiple sclerosis: Secondary | ICD-10-CM | POA: Diagnosis not present

## 2024-05-24 DIAGNOSIS — R278 Other lack of coordination: Secondary | ICD-10-CM | POA: Diagnosis not present

## 2024-05-24 DIAGNOSIS — M4628 Osteomyelitis of vertebra, sacral and sacrococcygeal region: Secondary | ICD-10-CM | POA: Diagnosis not present

## 2024-05-24 DIAGNOSIS — M6281 Muscle weakness (generalized): Secondary | ICD-10-CM | POA: Diagnosis not present

## 2024-05-24 DIAGNOSIS — J189 Pneumonia, unspecified organism: Secondary | ICD-10-CM | POA: Diagnosis not present

## 2024-05-24 DIAGNOSIS — R1312 Dysphagia, oropharyngeal phase: Secondary | ICD-10-CM | POA: Diagnosis not present

## 2024-05-25 DIAGNOSIS — L89323 Pressure ulcer of left buttock, stage 3: Secondary | ICD-10-CM | POA: Diagnosis not present

## 2024-05-25 DIAGNOSIS — R159 Full incontinence of feces: Secondary | ICD-10-CM | POA: Diagnosis not present

## 2024-05-25 DIAGNOSIS — R278 Other lack of coordination: Secondary | ICD-10-CM | POA: Diagnosis not present

## 2024-05-25 DIAGNOSIS — L89616 Pressure-induced deep tissue damage of right heel: Secondary | ICD-10-CM | POA: Diagnosis not present

## 2024-05-25 DIAGNOSIS — R1312 Dysphagia, oropharyngeal phase: Secondary | ICD-10-CM | POA: Diagnosis not present

## 2024-05-25 DIAGNOSIS — J189 Pneumonia, unspecified organism: Secondary | ICD-10-CM | POA: Diagnosis not present

## 2024-05-25 DIAGNOSIS — L89620 Pressure ulcer of left heel, unstageable: Secondary | ICD-10-CM | POA: Diagnosis not present

## 2024-05-25 DIAGNOSIS — G894 Chronic pain syndrome: Secondary | ICD-10-CM | POA: Diagnosis not present

## 2024-05-25 DIAGNOSIS — L89314 Pressure ulcer of right buttock, stage 4: Secondary | ICD-10-CM | POA: Diagnosis not present

## 2024-05-25 DIAGNOSIS — M6281 Muscle weakness (generalized): Secondary | ICD-10-CM | POA: Diagnosis not present

## 2024-05-25 DIAGNOSIS — L89154 Pressure ulcer of sacral region, stage 4: Secondary | ICD-10-CM | POA: Diagnosis not present

## 2024-05-25 DIAGNOSIS — L89510 Pressure ulcer of right ankle, unstageable: Secondary | ICD-10-CM | POA: Diagnosis not present

## 2024-05-25 DIAGNOSIS — M4628 Osteomyelitis of vertebra, sacral and sacrococcygeal region: Secondary | ICD-10-CM | POA: Diagnosis not present

## 2024-05-25 DIAGNOSIS — G35 Multiple sclerosis: Secondary | ICD-10-CM | POA: Diagnosis not present

## 2024-05-26 DIAGNOSIS — F419 Anxiety disorder, unspecified: Secondary | ICD-10-CM | POA: Diagnosis not present

## 2024-05-26 DIAGNOSIS — J189 Pneumonia, unspecified organism: Secondary | ICD-10-CM | POA: Diagnosis not present

## 2024-05-26 DIAGNOSIS — R278 Other lack of coordination: Secondary | ICD-10-CM | POA: Diagnosis not present

## 2024-05-26 DIAGNOSIS — M4628 Osteomyelitis of vertebra, sacral and sacrococcygeal region: Secondary | ICD-10-CM | POA: Diagnosis not present

## 2024-05-26 DIAGNOSIS — R1312 Dysphagia, oropharyngeal phase: Secondary | ICD-10-CM | POA: Diagnosis not present

## 2024-05-26 DIAGNOSIS — G35 Multiple sclerosis: Secondary | ICD-10-CM | POA: Diagnosis not present

## 2024-05-26 DIAGNOSIS — M6281 Muscle weakness (generalized): Secondary | ICD-10-CM | POA: Diagnosis not present

## 2024-05-27 DIAGNOSIS — M4628 Osteomyelitis of vertebra, sacral and sacrococcygeal region: Secondary | ICD-10-CM | POA: Diagnosis not present

## 2024-05-27 DIAGNOSIS — R278 Other lack of coordination: Secondary | ICD-10-CM | POA: Diagnosis not present

## 2024-05-27 DIAGNOSIS — M6281 Muscle weakness (generalized): Secondary | ICD-10-CM | POA: Diagnosis not present

## 2024-05-27 DIAGNOSIS — R1312 Dysphagia, oropharyngeal phase: Secondary | ICD-10-CM | POA: Diagnosis not present

## 2024-05-27 DIAGNOSIS — G35 Multiple sclerosis: Secondary | ICD-10-CM | POA: Diagnosis not present

## 2024-05-27 DIAGNOSIS — J189 Pneumonia, unspecified organism: Secondary | ICD-10-CM | POA: Diagnosis not present

## 2024-05-28 DIAGNOSIS — R278 Other lack of coordination: Secondary | ICD-10-CM | POA: Diagnosis not present

## 2024-05-28 DIAGNOSIS — M6281 Muscle weakness (generalized): Secondary | ICD-10-CM | POA: Diagnosis not present

## 2024-05-28 DIAGNOSIS — R1312 Dysphagia, oropharyngeal phase: Secondary | ICD-10-CM | POA: Diagnosis not present

## 2024-05-28 DIAGNOSIS — J189 Pneumonia, unspecified organism: Secondary | ICD-10-CM | POA: Diagnosis not present

## 2024-05-28 DIAGNOSIS — M4628 Osteomyelitis of vertebra, sacral and sacrococcygeal region: Secondary | ICD-10-CM | POA: Diagnosis not present

## 2024-05-28 DIAGNOSIS — G35 Multiple sclerosis: Secondary | ICD-10-CM | POA: Diagnosis not present

## 2024-05-30 DIAGNOSIS — G35 Multiple sclerosis: Secondary | ICD-10-CM | POA: Diagnosis not present

## 2024-05-30 DIAGNOSIS — R1312 Dysphagia, oropharyngeal phase: Secondary | ICD-10-CM | POA: Diagnosis not present

## 2024-05-30 DIAGNOSIS — M6281 Muscle weakness (generalized): Secondary | ICD-10-CM | POA: Diagnosis not present

## 2024-05-30 DIAGNOSIS — J189 Pneumonia, unspecified organism: Secondary | ICD-10-CM | POA: Diagnosis not present

## 2024-05-30 DIAGNOSIS — M4628 Osteomyelitis of vertebra, sacral and sacrococcygeal region: Secondary | ICD-10-CM | POA: Diagnosis not present

## 2024-05-30 DIAGNOSIS — R278 Other lack of coordination: Secondary | ICD-10-CM | POA: Diagnosis not present

## 2024-05-31 DIAGNOSIS — G35 Multiple sclerosis: Secondary | ICD-10-CM | POA: Diagnosis not present

## 2024-05-31 DIAGNOSIS — R278 Other lack of coordination: Secondary | ICD-10-CM | POA: Diagnosis not present

## 2024-05-31 DIAGNOSIS — M4628 Osteomyelitis of vertebra, sacral and sacrococcygeal region: Secondary | ICD-10-CM | POA: Diagnosis not present

## 2024-05-31 DIAGNOSIS — J189 Pneumonia, unspecified organism: Secondary | ICD-10-CM | POA: Diagnosis not present

## 2024-05-31 DIAGNOSIS — M6281 Muscle weakness (generalized): Secondary | ICD-10-CM | POA: Diagnosis not present

## 2024-05-31 DIAGNOSIS — R1312 Dysphagia, oropharyngeal phase: Secondary | ICD-10-CM | POA: Diagnosis not present

## 2024-06-01 DIAGNOSIS — R278 Other lack of coordination: Secondary | ICD-10-CM | POA: Diagnosis not present

## 2024-06-01 DIAGNOSIS — M4628 Osteomyelitis of vertebra, sacral and sacrococcygeal region: Secondary | ICD-10-CM | POA: Diagnosis not present

## 2024-06-01 DIAGNOSIS — R1312 Dysphagia, oropharyngeal phase: Secondary | ICD-10-CM | POA: Diagnosis not present

## 2024-06-01 DIAGNOSIS — M6281 Muscle weakness (generalized): Secondary | ICD-10-CM | POA: Diagnosis not present

## 2024-06-01 DIAGNOSIS — I129 Hypertensive chronic kidney disease with stage 1 through stage 4 chronic kidney disease, or unspecified chronic kidney disease: Secondary | ICD-10-CM | POA: Diagnosis not present

## 2024-06-01 DIAGNOSIS — G35 Multiple sclerosis: Secondary | ICD-10-CM | POA: Diagnosis not present

## 2024-06-01 DIAGNOSIS — G894 Chronic pain syndrome: Secondary | ICD-10-CM | POA: Diagnosis not present

## 2024-06-01 DIAGNOSIS — L89616 Pressure-induced deep tissue damage of right heel: Secondary | ICD-10-CM | POA: Diagnosis not present

## 2024-06-01 DIAGNOSIS — J189 Pneumonia, unspecified organism: Secondary | ICD-10-CM | POA: Diagnosis not present

## 2024-06-01 DIAGNOSIS — L89323 Pressure ulcer of left buttock, stage 3: Secondary | ICD-10-CM | POA: Diagnosis not present

## 2024-06-01 DIAGNOSIS — R159 Full incontinence of feces: Secondary | ICD-10-CM | POA: Diagnosis not present

## 2024-06-01 DIAGNOSIS — L89620 Pressure ulcer of left heel, unstageable: Secondary | ICD-10-CM | POA: Diagnosis not present

## 2024-06-01 DIAGNOSIS — L89154 Pressure ulcer of sacral region, stage 4: Secondary | ICD-10-CM | POA: Diagnosis not present

## 2024-06-01 DIAGNOSIS — I69391 Dysphagia following cerebral infarction: Secondary | ICD-10-CM | POA: Diagnosis not present

## 2024-06-01 DIAGNOSIS — L89510 Pressure ulcer of right ankle, unstageable: Secondary | ICD-10-CM | POA: Diagnosis not present

## 2024-06-01 DIAGNOSIS — L89314 Pressure ulcer of right buttock, stage 4: Secondary | ICD-10-CM | POA: Diagnosis not present

## 2024-06-02 ENCOUNTER — Ambulatory Visit (HOSPITAL_COMMUNITY)
Admission: RE | Admit: 2024-06-02 | Discharge: 2024-06-02 | Disposition: A | Discharge status: Home / Self Care | Source: Ambulatory Visit | Attending: Radiology | Admitting: Radiology

## 2024-06-02 ENCOUNTER — Other Ambulatory Visit (HOSPITAL_COMMUNITY): Payer: Self-pay | Admitting: Radiology

## 2024-06-02 DIAGNOSIS — M4628 Osteomyelitis of vertebra, sacral and sacrococcygeal region: Secondary | ICD-10-CM | POA: Diagnosis not present

## 2024-06-02 DIAGNOSIS — G35 Multiple sclerosis: Secondary | ICD-10-CM | POA: Diagnosis not present

## 2024-06-02 DIAGNOSIS — Z436 Encounter for attention to other artificial openings of urinary tract: Secondary | ICD-10-CM | POA: Insufficient documentation

## 2024-06-02 DIAGNOSIS — N319 Neuromuscular dysfunction of bladder, unspecified: Secondary | ICD-10-CM

## 2024-06-02 DIAGNOSIS — M6281 Muscle weakness (generalized): Secondary | ICD-10-CM | POA: Diagnosis not present

## 2024-06-02 DIAGNOSIS — R278 Other lack of coordination: Secondary | ICD-10-CM | POA: Diagnosis not present

## 2024-06-02 DIAGNOSIS — J189 Pneumonia, unspecified organism: Secondary | ICD-10-CM | POA: Diagnosis not present

## 2024-06-02 DIAGNOSIS — R1312 Dysphagia, oropharyngeal phase: Secondary | ICD-10-CM | POA: Diagnosis not present

## 2024-06-02 HISTORY — PX: IR NEPHROSTOMY EXCHANGE LEFT: IMG6069

## 2024-06-02 MED ORDER — IOHEXOL 300 MG/ML  SOLN: 50.0000 mL | Freq: Once | INTRAMUSCULAR | Status: DC | PRN

## 2024-06-02 MED ORDER — LIDOCAINE HCL 1 % IJ SOLN: 20.0000 mL | Freq: Once | INTRAMUSCULAR | Status: DC

## 2024-06-02 MED ORDER — LIDOCAINE HCL 1 % IJ SOLN
INTRAMUSCULAR | Status: AC
  Filled 2024-06-02: qty 20

## 2024-06-02 NOTE — Procedures (Signed)
 Interventional Radiology Procedure:   Indications: Routine exchange of left nephrostomy tube  Procedure: Nephrostomy tube exchange  Findings: New 10 Fr tube positioned in left renal pelvis  Complications: None     EBL: Minimal  Plan: Continue with routine exchanges  Jesstin Studstill R. Philip, MD  Pager: [phone]

## 2024-06-03 DIAGNOSIS — J189 Pneumonia, unspecified organism: Secondary | ICD-10-CM | POA: Diagnosis not present

## 2024-06-03 DIAGNOSIS — J449 Chronic obstructive pulmonary disease, unspecified: Secondary | ICD-10-CM | POA: Diagnosis not present

## 2024-06-03 DIAGNOSIS — R278 Other lack of coordination: Secondary | ICD-10-CM | POA: Diagnosis not present

## 2024-06-03 DIAGNOSIS — M6281 Muscle weakness (generalized): Secondary | ICD-10-CM | POA: Diagnosis not present

## 2024-06-03 DIAGNOSIS — N390 Urinary tract infection, site not specified: Secondary | ICD-10-CM | POA: Diagnosis not present

## 2024-06-03 DIAGNOSIS — R1312 Dysphagia, oropharyngeal phase: Secondary | ICD-10-CM | POA: Diagnosis not present

## 2024-06-03 DIAGNOSIS — G35 Multiple sclerosis: Secondary | ICD-10-CM | POA: Diagnosis not present

## 2024-06-03 DIAGNOSIS — M4628 Osteomyelitis of vertebra, sacral and sacrococcygeal region: Secondary | ICD-10-CM | POA: Diagnosis not present

## 2024-06-06 DIAGNOSIS — R1312 Dysphagia, oropharyngeal phase: Secondary | ICD-10-CM | POA: Diagnosis not present

## 2024-06-06 DIAGNOSIS — I679 Cerebrovascular disease, unspecified: Secondary | ICD-10-CM | POA: Diagnosis not present

## 2024-06-06 DIAGNOSIS — J8410 Pulmonary fibrosis, unspecified: Secondary | ICD-10-CM | POA: Diagnosis not present

## 2024-06-06 DIAGNOSIS — I129 Hypertensive chronic kidney disease with stage 1 through stage 4 chronic kidney disease, or unspecified chronic kidney disease: Secondary | ICD-10-CM | POA: Diagnosis not present

## 2024-06-06 DIAGNOSIS — E889 Metabolic disorder, unspecified: Secondary | ICD-10-CM | POA: Diagnosis not present

## 2024-06-06 DIAGNOSIS — M6281 Muscle weakness (generalized): Secondary | ICD-10-CM | POA: Diagnosis not present

## 2024-06-06 DIAGNOSIS — L89154 Pressure ulcer of sacral region, stage 4: Secondary | ICD-10-CM | POA: Diagnosis not present

## 2024-06-06 DIAGNOSIS — E440 Moderate protein-calorie malnutrition: Secondary | ICD-10-CM | POA: Diagnosis not present

## 2024-06-06 DIAGNOSIS — R278 Other lack of coordination: Secondary | ICD-10-CM | POA: Diagnosis not present

## 2024-06-06 DIAGNOSIS — J449 Chronic obstructive pulmonary disease, unspecified: Secondary | ICD-10-CM | POA: Diagnosis not present

## 2024-06-06 DIAGNOSIS — J439 Emphysema, unspecified: Secondary | ICD-10-CM | POA: Diagnosis not present

## 2024-06-06 DIAGNOSIS — J189 Pneumonia, unspecified organism: Secondary | ICD-10-CM | POA: Diagnosis not present

## 2024-06-06 DIAGNOSIS — G35 Multiple sclerosis: Secondary | ICD-10-CM | POA: Diagnosis not present

## 2024-06-06 DIAGNOSIS — I69391 Dysphagia following cerebral infarction: Secondary | ICD-10-CM | POA: Diagnosis not present

## 2024-06-06 DIAGNOSIS — M4628 Osteomyelitis of vertebra, sacral and sacrococcygeal region: Secondary | ICD-10-CM | POA: Diagnosis not present

## 2024-06-07 DIAGNOSIS — R278 Other lack of coordination: Secondary | ICD-10-CM | POA: Diagnosis not present

## 2024-06-07 DIAGNOSIS — I129 Hypertensive chronic kidney disease with stage 1 through stage 4 chronic kidney disease, or unspecified chronic kidney disease: Secondary | ICD-10-CM | POA: Diagnosis not present

## 2024-06-07 DIAGNOSIS — J189 Pneumonia, unspecified organism: Secondary | ICD-10-CM | POA: Diagnosis not present

## 2024-06-07 DIAGNOSIS — M4628 Osteomyelitis of vertebra, sacral and sacrococcygeal region: Secondary | ICD-10-CM | POA: Diagnosis not present

## 2024-06-07 DIAGNOSIS — I69391 Dysphagia following cerebral infarction: Secondary | ICD-10-CM | POA: Diagnosis not present

## 2024-06-07 DIAGNOSIS — R1312 Dysphagia, oropharyngeal phase: Secondary | ICD-10-CM | POA: Diagnosis not present

## 2024-06-07 DIAGNOSIS — G35 Multiple sclerosis: Secondary | ICD-10-CM | POA: Diagnosis not present

## 2024-06-07 DIAGNOSIS — M6281 Muscle weakness (generalized): Secondary | ICD-10-CM | POA: Diagnosis not present

## 2024-06-08 DIAGNOSIS — L89314 Pressure ulcer of right buttock, stage 4: Secondary | ICD-10-CM | POA: Diagnosis not present

## 2024-06-08 DIAGNOSIS — L89510 Pressure ulcer of right ankle, unstageable: Secondary | ICD-10-CM | POA: Diagnosis not present

## 2024-06-08 DIAGNOSIS — M6281 Muscle weakness (generalized): Secondary | ICD-10-CM | POA: Diagnosis not present

## 2024-06-08 DIAGNOSIS — L89154 Pressure ulcer of sacral region, stage 4: Secondary | ICD-10-CM | POA: Diagnosis not present

## 2024-06-08 DIAGNOSIS — J189 Pneumonia, unspecified organism: Secondary | ICD-10-CM | POA: Diagnosis not present

## 2024-06-08 DIAGNOSIS — G894 Chronic pain syndrome: Secondary | ICD-10-CM | POA: Diagnosis not present

## 2024-06-08 DIAGNOSIS — R159 Full incontinence of feces: Secondary | ICD-10-CM | POA: Diagnosis not present

## 2024-06-08 DIAGNOSIS — L89620 Pressure ulcer of left heel, unstageable: Secondary | ICD-10-CM | POA: Diagnosis not present

## 2024-06-08 DIAGNOSIS — R278 Other lack of coordination: Secondary | ICD-10-CM | POA: Diagnosis not present

## 2024-06-08 DIAGNOSIS — R1312 Dysphagia, oropharyngeal phase: Secondary | ICD-10-CM | POA: Diagnosis not present

## 2024-06-08 DIAGNOSIS — M4628 Osteomyelitis of vertebra, sacral and sacrococcygeal region: Secondary | ICD-10-CM | POA: Diagnosis not present

## 2024-06-08 DIAGNOSIS — L89323 Pressure ulcer of left buttock, stage 3: Secondary | ICD-10-CM | POA: Diagnosis not present

## 2024-06-08 DIAGNOSIS — G35 Multiple sclerosis: Secondary | ICD-10-CM | POA: Diagnosis not present

## 2024-06-08 DIAGNOSIS — L89616 Pressure-induced deep tissue damage of right heel: Secondary | ICD-10-CM | POA: Diagnosis not present

## 2024-06-09 DIAGNOSIS — R278 Other lack of coordination: Secondary | ICD-10-CM | POA: Diagnosis not present

## 2024-06-09 DIAGNOSIS — G35 Multiple sclerosis: Secondary | ICD-10-CM | POA: Diagnosis not present

## 2024-06-09 DIAGNOSIS — J189 Pneumonia, unspecified organism: Secondary | ICD-10-CM | POA: Diagnosis not present

## 2024-06-09 DIAGNOSIS — R1312 Dysphagia, oropharyngeal phase: Secondary | ICD-10-CM | POA: Diagnosis not present

## 2024-06-09 DIAGNOSIS — M6281 Muscle weakness (generalized): Secondary | ICD-10-CM | POA: Diagnosis not present

## 2024-06-09 DIAGNOSIS — M4628 Osteomyelitis of vertebra, sacral and sacrococcygeal region: Secondary | ICD-10-CM | POA: Diagnosis not present

## 2024-06-10 DIAGNOSIS — G35 Multiple sclerosis: Secondary | ICD-10-CM | POA: Diagnosis not present

## 2024-06-10 DIAGNOSIS — M6281 Muscle weakness (generalized): Secondary | ICD-10-CM | POA: Diagnosis not present

## 2024-06-10 DIAGNOSIS — J189 Pneumonia, unspecified organism: Secondary | ICD-10-CM | POA: Diagnosis not present

## 2024-06-10 DIAGNOSIS — R278 Other lack of coordination: Secondary | ICD-10-CM | POA: Diagnosis not present

## 2024-06-10 DIAGNOSIS — R1312 Dysphagia, oropharyngeal phase: Secondary | ICD-10-CM | POA: Diagnosis not present

## 2024-06-10 DIAGNOSIS — M4628 Osteomyelitis of vertebra, sacral and sacrococcygeal region: Secondary | ICD-10-CM | POA: Diagnosis not present

## 2024-06-13 DIAGNOSIS — M4628 Osteomyelitis of vertebra, sacral and sacrococcygeal region: Secondary | ICD-10-CM | POA: Diagnosis not present

## 2024-06-13 DIAGNOSIS — G35 Multiple sclerosis: Secondary | ICD-10-CM | POA: Diagnosis not present

## 2024-06-13 DIAGNOSIS — R278 Other lack of coordination: Secondary | ICD-10-CM | POA: Diagnosis not present

## 2024-06-13 DIAGNOSIS — R1312 Dysphagia, oropharyngeal phase: Secondary | ICD-10-CM | POA: Diagnosis not present

## 2024-06-13 DIAGNOSIS — M6281 Muscle weakness (generalized): Secondary | ICD-10-CM | POA: Diagnosis not present

## 2024-06-13 DIAGNOSIS — J189 Pneumonia, unspecified organism: Secondary | ICD-10-CM | POA: Diagnosis not present

## 2024-06-14 ENCOUNTER — Ambulatory Visit: Admitting: Vascular Surgery

## 2024-06-14 DIAGNOSIS — M4628 Osteomyelitis of vertebra, sacral and sacrococcygeal region: Secondary | ICD-10-CM | POA: Diagnosis not present

## 2024-06-14 DIAGNOSIS — R1312 Dysphagia, oropharyngeal phase: Secondary | ICD-10-CM | POA: Diagnosis not present

## 2024-06-14 DIAGNOSIS — G35 Multiple sclerosis: Secondary | ICD-10-CM | POA: Diagnosis not present

## 2024-06-14 DIAGNOSIS — J189 Pneumonia, unspecified organism: Secondary | ICD-10-CM | POA: Diagnosis not present

## 2024-06-14 DIAGNOSIS — R278 Other lack of coordination: Secondary | ICD-10-CM | POA: Diagnosis not present

## 2024-06-14 DIAGNOSIS — M6281 Muscle weakness (generalized): Secondary | ICD-10-CM | POA: Diagnosis not present

## 2024-06-15 DIAGNOSIS — J189 Pneumonia, unspecified organism: Secondary | ICD-10-CM | POA: Diagnosis not present

## 2024-06-15 DIAGNOSIS — M4628 Osteomyelitis of vertebra, sacral and sacrococcygeal region: Secondary | ICD-10-CM | POA: Diagnosis not present

## 2024-06-15 DIAGNOSIS — L89616 Pressure-induced deep tissue damage of right heel: Secondary | ICD-10-CM | POA: Diagnosis not present

## 2024-06-15 DIAGNOSIS — R278 Other lack of coordination: Secondary | ICD-10-CM | POA: Diagnosis not present

## 2024-06-15 DIAGNOSIS — L89314 Pressure ulcer of right buttock, stage 4: Secondary | ICD-10-CM | POA: Diagnosis not present

## 2024-06-15 DIAGNOSIS — G894 Chronic pain syndrome: Secondary | ICD-10-CM | POA: Diagnosis not present

## 2024-06-15 DIAGNOSIS — M6281 Muscle weakness (generalized): Secondary | ICD-10-CM | POA: Diagnosis not present

## 2024-06-15 DIAGNOSIS — G35 Multiple sclerosis: Secondary | ICD-10-CM | POA: Diagnosis not present

## 2024-06-15 DIAGNOSIS — R1312 Dysphagia, oropharyngeal phase: Secondary | ICD-10-CM | POA: Diagnosis not present

## 2024-06-15 DIAGNOSIS — R159 Full incontinence of feces: Secondary | ICD-10-CM | POA: Diagnosis not present

## 2024-06-15 DIAGNOSIS — L89620 Pressure ulcer of left heel, unstageable: Secondary | ICD-10-CM | POA: Diagnosis not present

## 2024-06-15 DIAGNOSIS — L89154 Pressure ulcer of sacral region, stage 4: Secondary | ICD-10-CM | POA: Diagnosis not present

## 2024-06-15 DIAGNOSIS — L89510 Pressure ulcer of right ankle, unstageable: Secondary | ICD-10-CM | POA: Diagnosis not present

## 2024-06-16 DIAGNOSIS — R278 Other lack of coordination: Secondary | ICD-10-CM | POA: Diagnosis not present

## 2024-06-16 DIAGNOSIS — M4628 Osteomyelitis of vertebra, sacral and sacrococcygeal region: Secondary | ICD-10-CM | POA: Diagnosis not present

## 2024-06-16 DIAGNOSIS — G35 Multiple sclerosis: Secondary | ICD-10-CM | POA: Diagnosis not present

## 2024-06-16 DIAGNOSIS — M6281 Muscle weakness (generalized): Secondary | ICD-10-CM | POA: Diagnosis not present

## 2024-06-16 DIAGNOSIS — J189 Pneumonia, unspecified organism: Secondary | ICD-10-CM | POA: Diagnosis not present

## 2024-06-16 DIAGNOSIS — R1312 Dysphagia, oropharyngeal phase: Secondary | ICD-10-CM | POA: Diagnosis not present

## 2024-06-17 DIAGNOSIS — R1312 Dysphagia, oropharyngeal phase: Secondary | ICD-10-CM | POA: Diagnosis not present

## 2024-06-17 DIAGNOSIS — M4628 Osteomyelitis of vertebra, sacral and sacrococcygeal region: Secondary | ICD-10-CM | POA: Diagnosis not present

## 2024-06-17 DIAGNOSIS — G35 Multiple sclerosis: Secondary | ICD-10-CM | POA: Diagnosis not present

## 2024-06-17 DIAGNOSIS — M6281 Muscle weakness (generalized): Secondary | ICD-10-CM | POA: Diagnosis not present

## 2024-06-17 DIAGNOSIS — R278 Other lack of coordination: Secondary | ICD-10-CM | POA: Diagnosis not present

## 2024-06-17 DIAGNOSIS — J189 Pneumonia, unspecified organism: Secondary | ICD-10-CM | POA: Diagnosis not present

## 2024-06-18 DIAGNOSIS — G35 Multiple sclerosis: Secondary | ICD-10-CM | POA: Diagnosis not present

## 2024-06-18 DIAGNOSIS — M6281 Muscle weakness (generalized): Secondary | ICD-10-CM | POA: Diagnosis not present

## 2024-06-18 DIAGNOSIS — J189 Pneumonia, unspecified organism: Secondary | ICD-10-CM | POA: Diagnosis not present

## 2024-06-18 DIAGNOSIS — R1312 Dysphagia, oropharyngeal phase: Secondary | ICD-10-CM | POA: Diagnosis not present

## 2024-06-18 DIAGNOSIS — R278 Other lack of coordination: Secondary | ICD-10-CM | POA: Diagnosis not present

## 2024-06-18 DIAGNOSIS — M4628 Osteomyelitis of vertebra, sacral and sacrococcygeal region: Secondary | ICD-10-CM | POA: Diagnosis not present

## 2024-06-20 DIAGNOSIS — G35 Multiple sclerosis: Secondary | ICD-10-CM | POA: Diagnosis not present

## 2024-06-20 DIAGNOSIS — R1312 Dysphagia, oropharyngeal phase: Secondary | ICD-10-CM | POA: Diagnosis not present

## 2024-06-20 DIAGNOSIS — I129 Hypertensive chronic kidney disease with stage 1 through stage 4 chronic kidney disease, or unspecified chronic kidney disease: Secondary | ICD-10-CM | POA: Diagnosis not present

## 2024-06-20 DIAGNOSIS — M6281 Muscle weakness (generalized): Secondary | ICD-10-CM | POA: Diagnosis not present

## 2024-06-20 DIAGNOSIS — R278 Other lack of coordination: Secondary | ICD-10-CM | POA: Diagnosis not present

## 2024-06-20 DIAGNOSIS — I69391 Dysphagia following cerebral infarction: Secondary | ICD-10-CM | POA: Diagnosis not present

## 2024-06-20 DIAGNOSIS — M4628 Osteomyelitis of vertebra, sacral and sacrococcygeal region: Secondary | ICD-10-CM | POA: Diagnosis not present

## 2024-06-20 DIAGNOSIS — J189 Pneumonia, unspecified organism: Secondary | ICD-10-CM | POA: Diagnosis not present

## 2024-06-21 ENCOUNTER — Ambulatory Visit (HOSPITAL_COMMUNITY)
Admission: RE | Admit: 2024-06-21 | Discharge: 2024-06-21 | Disposition: A | Discharge status: Home / Self Care | Source: Ambulatory Visit | Attending: Vascular Surgery | Admitting: Vascular Surgery

## 2024-06-21 DIAGNOSIS — N200 Calculus of kidney: Secondary | ICD-10-CM | POA: Diagnosis not present

## 2024-06-21 DIAGNOSIS — J189 Pneumonia, unspecified organism: Secondary | ICD-10-CM | POA: Diagnosis not present

## 2024-06-21 DIAGNOSIS — G35 Multiple sclerosis: Secondary | ICD-10-CM | POA: Diagnosis not present

## 2024-06-21 DIAGNOSIS — I7025 Atherosclerosis of native arteries of other extremities with ulceration: Secondary | ICD-10-CM | POA: Insufficient documentation

## 2024-06-21 DIAGNOSIS — M4628 Osteomyelitis of vertebra, sacral and sacrococcygeal region: Secondary | ICD-10-CM | POA: Diagnosis not present

## 2024-06-21 DIAGNOSIS — I70203 Unspecified atherosclerosis of native arteries of extremities, bilateral legs: Secondary | ICD-10-CM | POA: Diagnosis not present

## 2024-06-21 DIAGNOSIS — M6281 Muscle weakness (generalized): Secondary | ICD-10-CM | POA: Diagnosis not present

## 2024-06-21 DIAGNOSIS — I7092 Chronic total occlusion of artery of the extremities: Secondary | ICD-10-CM | POA: Diagnosis not present

## 2024-06-21 DIAGNOSIS — I701 Atherosclerosis of renal artery: Secondary | ICD-10-CM | POA: Diagnosis not present

## 2024-06-21 DIAGNOSIS — R278 Other lack of coordination: Secondary | ICD-10-CM | POA: Diagnosis not present

## 2024-06-21 DIAGNOSIS — R1312 Dysphagia, oropharyngeal phase: Secondary | ICD-10-CM | POA: Diagnosis not present

## 2024-06-21 LAB — POCT I-STAT CREATININE: Creatinine, Ser: 2 mg/dL — ABNORMAL HIGH (ref 0.61–1.24)

## 2024-06-21 MED ORDER — IOHEXOL 350 MG/ML SOLN
125.0000 mL | Freq: Once | INTRAVENOUS | Status: AC | PRN
  Administered 2024-06-21: 80 mL via INTRAVENOUS

## 2024-06-22 ENCOUNTER — Ambulatory Visit

## 2024-06-22 DIAGNOSIS — J8410 Pulmonary fibrosis, unspecified: Secondary | ICD-10-CM | POA: Diagnosis not present

## 2024-06-22 DIAGNOSIS — E440 Moderate protein-calorie malnutrition: Secondary | ICD-10-CM | POA: Diagnosis not present

## 2024-06-22 DIAGNOSIS — L89510 Pressure ulcer of right ankle, unstageable: Secondary | ICD-10-CM | POA: Diagnosis not present

## 2024-06-22 DIAGNOSIS — L89620 Pressure ulcer of left heel, unstageable: Secondary | ICD-10-CM | POA: Diagnosis not present

## 2024-06-22 DIAGNOSIS — M4628 Osteomyelitis of vertebra, sacral and sacrococcygeal region: Secondary | ICD-10-CM | POA: Diagnosis not present

## 2024-06-22 DIAGNOSIS — L89616 Pressure-induced deep tissue damage of right heel: Secondary | ICD-10-CM | POA: Diagnosis not present

## 2024-06-22 DIAGNOSIS — L89154 Pressure ulcer of sacral region, stage 4: Secondary | ICD-10-CM | POA: Diagnosis not present

## 2024-06-22 DIAGNOSIS — R159 Full incontinence of feces: Secondary | ICD-10-CM | POA: Diagnosis not present

## 2024-06-22 DIAGNOSIS — L89314 Pressure ulcer of right buttock, stage 4: Secondary | ICD-10-CM | POA: Diagnosis not present

## 2024-06-22 DIAGNOSIS — G894 Chronic pain syndrome: Secondary | ICD-10-CM | POA: Diagnosis not present

## 2024-06-22 DIAGNOSIS — G35 Multiple sclerosis: Secondary | ICD-10-CM | POA: Diagnosis not present

## 2024-06-22 DIAGNOSIS — J449 Chronic obstructive pulmonary disease, unspecified: Secondary | ICD-10-CM | POA: Diagnosis not present

## 2024-06-22 DIAGNOSIS — M6281 Muscle weakness (generalized): Secondary | ICD-10-CM | POA: Diagnosis not present

## 2024-06-22 DIAGNOSIS — Z1612 Extended spectrum beta lactamase (ESBL) resistance: Secondary | ICD-10-CM | POA: Diagnosis not present

## 2024-06-22 DIAGNOSIS — R339 Retention of urine, unspecified: Secondary | ICD-10-CM | POA: Diagnosis not present

## 2024-06-22 DIAGNOSIS — I639 Cerebral infarction, unspecified: Secondary | ICD-10-CM | POA: Diagnosis not present

## 2024-06-22 DIAGNOSIS — R278 Other lack of coordination: Secondary | ICD-10-CM | POA: Diagnosis not present

## 2024-06-22 DIAGNOSIS — R1312 Dysphagia, oropharyngeal phase: Secondary | ICD-10-CM | POA: Diagnosis not present

## 2024-06-22 DIAGNOSIS — J189 Pneumonia, unspecified organism: Secondary | ICD-10-CM | POA: Diagnosis not present

## 2024-06-22 NOTE — Progress Notes (Signed)
 Suprapubic Cath Change  Patient is present today for a suprapubic catheter change due to urinary retention.  10 ml of water  was drained from the balloon, a 24 FR foley cath was removed from the tract with out difficulty.  Suprapubic catheter site was cleaned and prepped in a sterile fashion with Betadinex3  A 24 FR foley cath was replaced into the tract no complications were noted. 70 cc flush return was noted, flush Clear yellow in color . 10 ml of sterile water  was inflated into the balloon and a night bag was attached for drainage.  Patient tolerated well. A night bag was given to patient and proper instruction was given on how to switch bags.    Performed by: Carlos, CMA  Follow up: Keep monthly  SP TUBE change

## 2024-06-23 ENCOUNTER — Ambulatory Visit

## 2024-06-23 DIAGNOSIS — R1312 Dysphagia, oropharyngeal phase: Secondary | ICD-10-CM | POA: Diagnosis not present

## 2024-06-23 DIAGNOSIS — R278 Other lack of coordination: Secondary | ICD-10-CM | POA: Diagnosis not present

## 2024-06-23 DIAGNOSIS — J189 Pneumonia, unspecified organism: Secondary | ICD-10-CM | POA: Diagnosis not present

## 2024-06-23 DIAGNOSIS — M4628 Osteomyelitis of vertebra, sacral and sacrococcygeal region: Secondary | ICD-10-CM | POA: Diagnosis not present

## 2024-06-23 DIAGNOSIS — M6281 Muscle weakness (generalized): Secondary | ICD-10-CM | POA: Diagnosis not present

## 2024-06-23 DIAGNOSIS — G35 Multiple sclerosis: Secondary | ICD-10-CM | POA: Diagnosis not present

## 2024-06-24 DIAGNOSIS — G35 Multiple sclerosis: Secondary | ICD-10-CM | POA: Diagnosis not present

## 2024-06-24 DIAGNOSIS — J189 Pneumonia, unspecified organism: Secondary | ICD-10-CM | POA: Diagnosis not present

## 2024-06-24 DIAGNOSIS — M6281 Muscle weakness (generalized): Secondary | ICD-10-CM | POA: Diagnosis not present

## 2024-06-24 DIAGNOSIS — M4628 Osteomyelitis of vertebra, sacral and sacrococcygeal region: Secondary | ICD-10-CM | POA: Diagnosis not present

## 2024-06-24 DIAGNOSIS — R278 Other lack of coordination: Secondary | ICD-10-CM | POA: Diagnosis not present

## 2024-06-24 DIAGNOSIS — I69391 Dysphagia following cerebral infarction: Secondary | ICD-10-CM | POA: Diagnosis not present

## 2024-06-24 DIAGNOSIS — I129 Hypertensive chronic kidney disease with stage 1 through stage 4 chronic kidney disease, or unspecified chronic kidney disease: Secondary | ICD-10-CM | POA: Diagnosis not present

## 2024-06-24 DIAGNOSIS — R1312 Dysphagia, oropharyngeal phase: Secondary | ICD-10-CM | POA: Diagnosis not present

## 2024-06-27 DIAGNOSIS — J189 Pneumonia, unspecified organism: Secondary | ICD-10-CM | POA: Diagnosis not present

## 2024-06-27 DIAGNOSIS — M4628 Osteomyelitis of vertebra, sacral and sacrococcygeal region: Secondary | ICD-10-CM | POA: Diagnosis not present

## 2024-06-27 DIAGNOSIS — R278 Other lack of coordination: Secondary | ICD-10-CM | POA: Diagnosis not present

## 2024-06-27 DIAGNOSIS — R1312 Dysphagia, oropharyngeal phase: Secondary | ICD-10-CM | POA: Diagnosis not present

## 2024-06-27 DIAGNOSIS — G35 Multiple sclerosis: Secondary | ICD-10-CM | POA: Diagnosis not present

## 2024-06-27 DIAGNOSIS — M6281 Muscle weakness (generalized): Secondary | ICD-10-CM | POA: Diagnosis not present

## 2024-06-28 ENCOUNTER — Other Ambulatory Visit (HOSPITAL_COMMUNITY): Payer: Self-pay | Admitting: Radiology

## 2024-06-28 DIAGNOSIS — R059 Cough, unspecified: Secondary | ICD-10-CM | POA: Diagnosis not present

## 2024-06-28 DIAGNOSIS — J189 Pneumonia, unspecified organism: Secondary | ICD-10-CM | POA: Diagnosis not present

## 2024-06-28 DIAGNOSIS — M4628 Osteomyelitis of vertebra, sacral and sacrococcygeal region: Secondary | ICD-10-CM | POA: Diagnosis not present

## 2024-06-28 DIAGNOSIS — N319 Neuromuscular dysfunction of bladder, unspecified: Secondary | ICD-10-CM

## 2024-06-28 DIAGNOSIS — M6281 Muscle weakness (generalized): Secondary | ICD-10-CM | POA: Diagnosis not present

## 2024-06-28 DIAGNOSIS — J440 Chronic obstructive pulmonary disease with acute lower respiratory infection: Secondary | ICD-10-CM | POA: Diagnosis not present

## 2024-06-28 DIAGNOSIS — R1312 Dysphagia, oropharyngeal phase: Secondary | ICD-10-CM | POA: Diagnosis not present

## 2024-06-28 DIAGNOSIS — J811 Chronic pulmonary edema: Secondary | ICD-10-CM | POA: Diagnosis not present

## 2024-06-28 DIAGNOSIS — J069 Acute upper respiratory infection, unspecified: Secondary | ICD-10-CM | POA: Diagnosis not present

## 2024-06-28 DIAGNOSIS — R278 Other lack of coordination: Secondary | ICD-10-CM | POA: Diagnosis not present

## 2024-06-28 DIAGNOSIS — G35 Multiple sclerosis: Secondary | ICD-10-CM | POA: Diagnosis not present

## 2024-06-29 DIAGNOSIS — E440 Moderate protein-calorie malnutrition: Secondary | ICD-10-CM | POA: Diagnosis not present

## 2024-06-29 DIAGNOSIS — L89616 Pressure-induced deep tissue damage of right heel: Secondary | ICD-10-CM | POA: Diagnosis not present

## 2024-06-29 DIAGNOSIS — R278 Other lack of coordination: Secondary | ICD-10-CM | POA: Diagnosis not present

## 2024-06-29 DIAGNOSIS — G894 Chronic pain syndrome: Secondary | ICD-10-CM | POA: Diagnosis not present

## 2024-06-29 DIAGNOSIS — R1312 Dysphagia, oropharyngeal phase: Secondary | ICD-10-CM | POA: Diagnosis not present

## 2024-06-29 DIAGNOSIS — G35D Multiple sclerosis, unspecified: Secondary | ICD-10-CM | POA: Diagnosis not present

## 2024-06-29 DIAGNOSIS — L89314 Pressure ulcer of right buttock, stage 4: Secondary | ICD-10-CM | POA: Diagnosis not present

## 2024-06-29 DIAGNOSIS — M6281 Muscle weakness (generalized): Secondary | ICD-10-CM | POA: Diagnosis not present

## 2024-06-29 DIAGNOSIS — R159 Full incontinence of feces: Secondary | ICD-10-CM | POA: Diagnosis not present

## 2024-06-29 DIAGNOSIS — L89620 Pressure ulcer of left heel, unstageable: Secondary | ICD-10-CM | POA: Diagnosis not present

## 2024-06-29 DIAGNOSIS — L89510 Pressure ulcer of right ankle, unstageable: Secondary | ICD-10-CM | POA: Diagnosis not present

## 2024-06-29 DIAGNOSIS — R2689 Other abnormalities of gait and mobility: Secondary | ICD-10-CM | POA: Diagnosis not present

## 2024-06-29 DIAGNOSIS — L89154 Pressure ulcer of sacral region, stage 4: Secondary | ICD-10-CM | POA: Diagnosis not present

## 2024-06-30 DIAGNOSIS — R2689 Other abnormalities of gait and mobility: Secondary | ICD-10-CM | POA: Diagnosis not present

## 2024-06-30 DIAGNOSIS — E440 Moderate protein-calorie malnutrition: Secondary | ICD-10-CM | POA: Diagnosis not present

## 2024-06-30 DIAGNOSIS — R1312 Dysphagia, oropharyngeal phase: Secondary | ICD-10-CM | POA: Diagnosis not present

## 2024-06-30 DIAGNOSIS — J440 Chronic obstructive pulmonary disease with acute lower respiratory infection: Secondary | ICD-10-CM | POA: Diagnosis not present

## 2024-06-30 DIAGNOSIS — R278 Other lack of coordination: Secondary | ICD-10-CM | POA: Diagnosis not present

## 2024-06-30 DIAGNOSIS — M6281 Muscle weakness (generalized): Secondary | ICD-10-CM | POA: Diagnosis not present

## 2024-06-30 DIAGNOSIS — M4628 Osteomyelitis of vertebra, sacral and sacrococcygeal region: Secondary | ICD-10-CM | POA: Diagnosis not present

## 2024-06-30 DIAGNOSIS — J8410 Pulmonary fibrosis, unspecified: Secondary | ICD-10-CM | POA: Diagnosis not present

## 2024-06-30 DIAGNOSIS — J069 Acute upper respiratory infection, unspecified: Secondary | ICD-10-CM | POA: Diagnosis not present

## 2024-07-01 DIAGNOSIS — R1312 Dysphagia, oropharyngeal phase: Secondary | ICD-10-CM | POA: Diagnosis not present

## 2024-07-01 DIAGNOSIS — E440 Moderate protein-calorie malnutrition: Secondary | ICD-10-CM | POA: Diagnosis not present

## 2024-07-01 DIAGNOSIS — R2689 Other abnormalities of gait and mobility: Secondary | ICD-10-CM | POA: Diagnosis not present

## 2024-07-01 DIAGNOSIS — R278 Other lack of coordination: Secondary | ICD-10-CM | POA: Diagnosis not present

## 2024-07-01 DIAGNOSIS — M6281 Muscle weakness (generalized): Secondary | ICD-10-CM | POA: Diagnosis not present

## 2024-07-04 DIAGNOSIS — R2689 Other abnormalities of gait and mobility: Secondary | ICD-10-CM | POA: Diagnosis not present

## 2024-07-04 DIAGNOSIS — J069 Acute upper respiratory infection, unspecified: Secondary | ICD-10-CM | POA: Diagnosis not present

## 2024-07-04 DIAGNOSIS — M4628 Osteomyelitis of vertebra, sacral and sacrococcygeal region: Secondary | ICD-10-CM | POA: Diagnosis not present

## 2024-07-04 DIAGNOSIS — R278 Other lack of coordination: Secondary | ICD-10-CM | POA: Diagnosis not present

## 2024-07-04 DIAGNOSIS — E440 Moderate protein-calorie malnutrition: Secondary | ICD-10-CM | POA: Diagnosis not present

## 2024-07-04 DIAGNOSIS — M6281 Muscle weakness (generalized): Secondary | ICD-10-CM | POA: Diagnosis not present

## 2024-07-04 DIAGNOSIS — Z681 Body mass index (BMI) 19 or less, adult: Secondary | ICD-10-CM | POA: Diagnosis not present

## 2024-07-04 DIAGNOSIS — J440 Chronic obstructive pulmonary disease with acute lower respiratory infection: Secondary | ICD-10-CM | POA: Diagnosis not present

## 2024-07-04 DIAGNOSIS — R1312 Dysphagia, oropharyngeal phase: Secondary | ICD-10-CM | POA: Diagnosis not present

## 2024-07-05 DIAGNOSIS — R1312 Dysphagia, oropharyngeal phase: Secondary | ICD-10-CM | POA: Diagnosis not present

## 2024-07-05 DIAGNOSIS — I10 Essential (primary) hypertension: Secondary | ICD-10-CM | POA: Diagnosis not present

## 2024-07-05 DIAGNOSIS — Z130 Encounter for screening for diseases of the blood and blood-forming organs and certain disorders involving the immune mechanism: Secondary | ICD-10-CM | POA: Diagnosis not present

## 2024-07-05 DIAGNOSIS — J069 Acute upper respiratory infection, unspecified: Secondary | ICD-10-CM | POA: Diagnosis not present

## 2024-07-05 DIAGNOSIS — M6281 Muscle weakness (generalized): Secondary | ICD-10-CM | POA: Diagnosis not present

## 2024-07-05 DIAGNOSIS — M4628 Osteomyelitis of vertebra, sacral and sacrococcygeal region: Secondary | ICD-10-CM | POA: Diagnosis not present

## 2024-07-05 DIAGNOSIS — R278 Other lack of coordination: Secondary | ICD-10-CM | POA: Diagnosis not present

## 2024-07-05 DIAGNOSIS — R2689 Other abnormalities of gait and mobility: Secondary | ICD-10-CM | POA: Diagnosis not present

## 2024-07-05 DIAGNOSIS — J189 Pneumonia, unspecified organism: Secondary | ICD-10-CM | POA: Diagnosis not present

## 2024-07-05 DIAGNOSIS — E440 Moderate protein-calorie malnutrition: Secondary | ICD-10-CM | POA: Diagnosis not present

## 2024-07-05 DIAGNOSIS — J440 Chronic obstructive pulmonary disease with acute lower respiratory infection: Secondary | ICD-10-CM | POA: Diagnosis not present

## 2024-07-06 ENCOUNTER — Other Ambulatory Visit: Payer: Self-pay

## 2024-07-06 ENCOUNTER — Inpatient Hospital Stay (HOSPITAL_COMMUNITY)
Admission: EM | Admit: 2024-07-06 | Discharge: 2024-07-11 | DRG: 871 | Disposition: A | Discharge status: Skilled Nursing Facility | Attending: Family Medicine | Admitting: Family Medicine

## 2024-07-06 ENCOUNTER — Emergency Department (HOSPITAL_COMMUNITY)

## 2024-07-06 ENCOUNTER — Encounter (HOSPITAL_COMMUNITY): Payer: Self-pay

## 2024-07-06 DIAGNOSIS — N1832 Chronic kidney disease, stage 3b: Secondary | ICD-10-CM | POA: Diagnosis present

## 2024-07-06 DIAGNOSIS — L89154 Pressure ulcer of sacral region, stage 4: Secondary | ICD-10-CM | POA: Diagnosis present

## 2024-07-06 DIAGNOSIS — L89134 Pressure ulcer of right lower back, stage 4: Secondary | ICD-10-CM | POA: Diagnosis not present

## 2024-07-06 DIAGNOSIS — N179 Acute kidney failure, unspecified: Secondary | ICD-10-CM | POA: Diagnosis present

## 2024-07-06 DIAGNOSIS — R54 Age-related physical debility: Secondary | ICD-10-CM | POA: Diagnosis present

## 2024-07-06 DIAGNOSIS — J9621 Acute and chronic respiratory failure with hypoxia: Secondary | ICD-10-CM | POA: Diagnosis present

## 2024-07-06 DIAGNOSIS — J440 Chronic obstructive pulmonary disease with acute lower respiratory infection: Secondary | ICD-10-CM | POA: Diagnosis present

## 2024-07-06 DIAGNOSIS — L89323 Pressure ulcer of left buttock, stage 3: Secondary | ICD-10-CM | POA: Diagnosis present

## 2024-07-06 DIAGNOSIS — E872 Acidosis, unspecified: Secondary | ICD-10-CM | POA: Diagnosis present

## 2024-07-06 DIAGNOSIS — Z1152 Encounter for screening for COVID-19: Secondary | ICD-10-CM

## 2024-07-06 DIAGNOSIS — E785 Hyperlipidemia, unspecified: Secondary | ICD-10-CM | POA: Diagnosis present

## 2024-07-06 DIAGNOSIS — Z881 Allergy status to other antibiotic agents status: Secondary | ICD-10-CM

## 2024-07-06 DIAGNOSIS — Z9841 Cataract extraction status, right eye: Secondary | ICD-10-CM

## 2024-07-06 DIAGNOSIS — G35D Multiple sclerosis, unspecified: Secondary | ICD-10-CM | POA: Diagnosis present

## 2024-07-06 DIAGNOSIS — L89143 Pressure ulcer of left lower back, stage 3: Secondary | ICD-10-CM | POA: Diagnosis not present

## 2024-07-06 DIAGNOSIS — R0602 Shortness of breath: Secondary | ICD-10-CM | POA: Diagnosis not present

## 2024-07-06 DIAGNOSIS — Z961 Presence of intraocular lens: Secondary | ICD-10-CM | POA: Diagnosis present

## 2024-07-06 DIAGNOSIS — Z79899 Other long term (current) drug therapy: Secondary | ICD-10-CM

## 2024-07-06 DIAGNOSIS — I10 Essential (primary) hypertension: Secondary | ICD-10-CM | POA: Diagnosis present

## 2024-07-06 DIAGNOSIS — Z820 Family history of epilepsy and other diseases of the nervous system: Secondary | ICD-10-CM

## 2024-07-06 DIAGNOSIS — Z9842 Cataract extraction status, left eye: Secondary | ICD-10-CM

## 2024-07-06 DIAGNOSIS — Z7401 Bed confinement status: Secondary | ICD-10-CM | POA: Diagnosis not present

## 2024-07-06 DIAGNOSIS — N319 Neuromuscular dysfunction of bladder, unspecified: Secondary | ICD-10-CM | POA: Diagnosis present

## 2024-07-06 DIAGNOSIS — Z87442 Personal history of urinary calculi: Secondary | ICD-10-CM

## 2024-07-06 DIAGNOSIS — R0600 Dyspnea, unspecified: Secondary | ICD-10-CM | POA: Diagnosis not present

## 2024-07-06 DIAGNOSIS — E876 Hypokalemia: Secondary | ICD-10-CM | POA: Diagnosis not present

## 2024-07-06 DIAGNOSIS — L89313 Pressure ulcer of right buttock, stage 3: Secondary | ICD-10-CM | POA: Diagnosis present

## 2024-07-06 DIAGNOSIS — E039 Hypothyroidism, unspecified: Secondary | ICD-10-CM | POA: Diagnosis present

## 2024-07-06 DIAGNOSIS — G4733 Obstructive sleep apnea (adult) (pediatric): Secondary | ICD-10-CM | POA: Diagnosis present

## 2024-07-06 DIAGNOSIS — Z7982 Long term (current) use of aspirin: Secondary | ICD-10-CM

## 2024-07-06 DIAGNOSIS — Z79890 Hormone replacement therapy (postmenopausal): Secondary | ICD-10-CM

## 2024-07-06 DIAGNOSIS — Z8249 Family history of ischemic heart disease and other diseases of the circulatory system: Secondary | ICD-10-CM

## 2024-07-06 DIAGNOSIS — Z860100 Personal history of colon polyps, unspecified: Secondary | ICD-10-CM

## 2024-07-06 DIAGNOSIS — R0902 Hypoxemia: Secondary | ICD-10-CM | POA: Diagnosis not present

## 2024-07-06 DIAGNOSIS — R1310 Dysphagia, unspecified: Secondary | ICD-10-CM | POA: Diagnosis present

## 2024-07-06 DIAGNOSIS — Z9049 Acquired absence of other specified parts of digestive tract: Secondary | ICD-10-CM

## 2024-07-06 DIAGNOSIS — I739 Peripheral vascular disease, unspecified: Secondary | ICD-10-CM | POA: Diagnosis present

## 2024-07-06 DIAGNOSIS — I700 Atherosclerosis of aorta: Secondary | ICD-10-CM | POA: Diagnosis not present

## 2024-07-06 DIAGNOSIS — Z9359 Other cystostomy status: Secondary | ICD-10-CM | POA: Diagnosis not present

## 2024-07-06 DIAGNOSIS — R6520 Severe sepsis without septic shock: Secondary | ICD-10-CM | POA: Diagnosis not present

## 2024-07-06 DIAGNOSIS — J8410 Pulmonary fibrosis, unspecified: Secondary | ICD-10-CM | POA: Diagnosis present

## 2024-07-06 DIAGNOSIS — Z888 Allergy status to other drugs, medicaments and biological substances status: Secondary | ICD-10-CM

## 2024-07-06 DIAGNOSIS — I5032 Chronic diastolic (congestive) heart failure: Secondary | ICD-10-CM | POA: Diagnosis present

## 2024-07-06 DIAGNOSIS — A419 Sepsis, unspecified organism: Secondary | ICD-10-CM | POA: Diagnosis not present

## 2024-07-06 DIAGNOSIS — Z936 Other artificial openings of urinary tract status: Secondary | ICD-10-CM

## 2024-07-06 DIAGNOSIS — I679 Cerebrovascular disease, unspecified: Secondary | ICD-10-CM | POA: Diagnosis not present

## 2024-07-06 DIAGNOSIS — J9601 Acute respiratory failure with hypoxia: Secondary | ICD-10-CM | POA: Diagnosis present

## 2024-07-06 DIAGNOSIS — N139 Obstructive and reflux uropathy, unspecified: Secondary | ICD-10-CM | POA: Diagnosis present

## 2024-07-06 DIAGNOSIS — M4628 Osteomyelitis of vertebra, sacral and sacrococcygeal region: Secondary | ICD-10-CM | POA: Diagnosis not present

## 2024-07-06 DIAGNOSIS — L89620 Pressure ulcer of left heel, unstageable: Secondary | ICD-10-CM | POA: Diagnosis not present

## 2024-07-06 DIAGNOSIS — I5042 Chronic combined systolic (congestive) and diastolic (congestive) heart failure: Secondary | ICD-10-CM | POA: Diagnosis present

## 2024-07-06 DIAGNOSIS — R509 Fever, unspecified: Secondary | ICD-10-CM | POA: Diagnosis not present

## 2024-07-06 DIAGNOSIS — I2510 Atherosclerotic heart disease of native coronary artery without angina pectoris: Secondary | ICD-10-CM | POA: Diagnosis present

## 2024-07-06 DIAGNOSIS — K219 Gastro-esophageal reflux disease without esophagitis: Secondary | ICD-10-CM | POA: Diagnosis present

## 2024-07-06 DIAGNOSIS — J189 Pneumonia, unspecified organism: Secondary | ICD-10-CM

## 2024-07-06 DIAGNOSIS — I130 Hypertensive heart and chronic kidney disease with heart failure and stage 1 through stage 4 chronic kidney disease, or unspecified chronic kidney disease: Secondary | ICD-10-CM | POA: Diagnosis present

## 2024-07-06 DIAGNOSIS — R9431 Abnormal electrocardiogram [ECG] [EKG]: Secondary | ICD-10-CM | POA: Diagnosis not present

## 2024-07-06 DIAGNOSIS — Z87891 Personal history of nicotine dependence: Secondary | ICD-10-CM

## 2024-07-06 DIAGNOSIS — L89623 Pressure ulcer of left heel, stage 3: Secondary | ICD-10-CM | POA: Diagnosis not present

## 2024-07-06 DIAGNOSIS — E440 Moderate protein-calorie malnutrition: Secondary | ICD-10-CM | POA: Diagnosis not present

## 2024-07-06 DIAGNOSIS — Z993 Dependence on wheelchair: Secondary | ICD-10-CM

## 2024-07-06 DIAGNOSIS — R059 Cough, unspecified: Secondary | ICD-10-CM | POA: Diagnosis not present

## 2024-07-06 DIAGNOSIS — Z8673 Personal history of transient ischemic attack (TIA), and cerebral infarction without residual deficits: Secondary | ICD-10-CM

## 2024-07-06 DIAGNOSIS — J181 Lobar pneumonia, unspecified organism: Secondary | ICD-10-CM | POA: Diagnosis not present

## 2024-07-06 LAB — CBC WITH DIFFERENTIAL/PLATELET
Abs Immature Granulocytes: 0.15 K/uL — ABNORMAL HIGH (ref 0.00–0.07)
Basophils Absolute: 0.1 K/uL (ref 0.0–0.1)
Basophils Relative: 0 %
Eosinophils Absolute: 0.1 K/uL (ref 0.0–0.5)
Eosinophils Relative: 0 %
HCT: 33.2 % — ABNORMAL LOW (ref 39.0–52.0)
Hemoglobin: 10.3 g/dL — ABNORMAL LOW (ref 13.0–17.0)
Immature Granulocytes: 1 %
Lymphocytes Relative: 16 %
Lymphs Abs: 3.8 K/uL (ref 0.7–4.0)
MCH: 27 pg (ref 26.0–34.0)
MCHC: 31 g/dL (ref 30.0–36.0)
MCV: 86.9 fL (ref 80.0–100.0)
Monocytes Absolute: 0.8 K/uL (ref 0.1–1.0)
Monocytes Relative: 3 %
Neutro Abs: 19 K/uL — ABNORMAL HIGH (ref 1.7–7.7)
Neutrophils Relative %: 80 %
Platelets: 291 K/uL (ref 150–400)
RBC: 3.82 MIL/uL — ABNORMAL LOW (ref 4.22–5.81)
RDW: 15.6 % — ABNORMAL HIGH (ref 11.5–15.5)
WBC: 23.9 K/uL — ABNORMAL HIGH (ref 4.0–10.5)
nRBC: 0 % (ref 0.0–0.2)

## 2024-07-06 LAB — URINALYSIS, ROUTINE W REFLEX MICROSCOPIC
Bilirubin Urine: NEGATIVE
Glucose, UA: NEGATIVE mg/dL
Ketones, ur: NEGATIVE mg/dL
Nitrite: NEGATIVE
Protein, ur: 100 mg/dL — AB
Specific Gravity, Urine: 1.014 (ref 1.005–1.030)
WBC, UA: >50 WBC/hpf (ref 0–5)
pH: 8 (ref 5.0–8.0)

## 2024-07-06 LAB — BLOOD GAS, VENOUS
Acid-base deficit: 3 mmol/L — ABNORMAL HIGH (ref 0.0–2.0)
Bicarbonate: 22.7 mmol/L (ref 20.0–28.0)
Drawn by: 66297
O2 Saturation: 16.9 %
Patient temperature: 37.2
pCO2, Ven: 42 mmHg — ABNORMAL LOW (ref 44–60)
pH, Ven: 7.34 (ref 7.25–7.43)
pO2, Ven: <31 mmHg — CL (ref 32–45)

## 2024-07-06 LAB — COMPREHENSIVE METABOLIC PANEL WITH GFR
ALT: 10 U/L (ref 0–44)
AST: 24 U/L (ref 15–41)
Albumin: 3.3 g/dL — ABNORMAL LOW (ref 3.5–5.0)
Alkaline Phosphatase: 124 U/L (ref 38–126)
Anion gap: 17 — ABNORMAL HIGH (ref 5–15)
BUN: 56 mg/dL — ABNORMAL HIGH (ref 8–23)
CO2: 20 mmol/L — ABNORMAL LOW (ref 22–32)
Calcium: 9.4 mg/dL (ref 8.9–10.3)
Chloride: 102 mmol/L (ref 98–111)
Creatinine, Ser: 2.91 mg/dL — ABNORMAL HIGH (ref 0.61–1.24)
GFR, Estimated: 21 mL/min — ABNORMAL LOW (ref >60)
Glucose, Bld: 122 mg/dL — ABNORMAL HIGH (ref 70–99)
Potassium: 3.1 mmol/L — ABNORMAL LOW (ref 3.5–5.1)
Sodium: 139 mmol/L (ref 135–145)
Total Bilirubin: 0.4 mg/dL (ref 0.0–1.2)
Total Protein: 8.5 g/dL — ABNORMAL HIGH (ref 6.5–8.1)

## 2024-07-06 LAB — RESP PANEL BY RT-PCR (RSV, FLU A&B, COVID)  RVPGX2
Influenza A by PCR: NEGATIVE
Influenza B by PCR: NEGATIVE
Resp Syncytial Virus by PCR: NEGATIVE
SARS Coronavirus 2 by RT PCR: NEGATIVE

## 2024-07-06 LAB — PROTIME-INR
INR: 1.1 (ref 0.8–1.2)
Prothrombin Time: 14.4 s (ref 11.4–15.2)

## 2024-07-06 LAB — TROPONIN T, HIGH SENSITIVITY
Troponin T High Sensitivity: 236 ng/L (ref 0–19)
Troponin T High Sensitivity: 225 ng/L (ref 0–19)
Troponin T High Sensitivity: 196 ng/L (ref 0–19)

## 2024-07-06 LAB — APTT: aPTT: 32 s (ref 24–36)

## 2024-07-06 LAB — PRO BRAIN NATRIURETIC PEPTIDE: Pro Brain Natriuretic Peptide: 21393 pg/mL — ABNORMAL HIGH (ref <300.0)

## 2024-07-06 LAB — LACTIC ACID, PLASMA
Lactic Acid, Venous: 2.8 mmol/L (ref 0.5–1.9)
Lactic Acid, Venous: 1.7 mmol/L (ref 0.5–1.9)

## 2024-07-06 LAB — STREP PNEUMONIAE URINARY ANTIGEN: Strep Pneumo Urinary Antigen: NEGATIVE

## 2024-07-06 LAB — MAGNESIUM: Magnesium: 1.9 mg/dL (ref 1.7–2.4)

## 2024-07-06 MED ORDER — HYDRALAZINE HCL 20 MG/ML IJ SOLN: 5.0000 mg | INTRAMUSCULAR | Status: DC | PRN

## 2024-07-06 MED ORDER — LACTATED RINGERS IV SOLN: INTRAVENOUS | Status: AC

## 2024-07-06 MED ORDER — LACTATED RINGERS IV BOLUS: 1000.0000 mL | Freq: Once | INTRAVENOUS | Status: DC

## 2024-07-06 MED ORDER — ACETAMINOPHEN 325 MG PO TABS
650.0000 mg | ORAL_TABLET | Freq: Four times a day (QID) | ORAL | Status: DC | PRN
  Administered 2024-07-06 – 2024-07-09 (×5): 650 mg via ORAL
  Filled 2024-07-06 (×5): qty 2

## 2024-07-06 MED ORDER — THIAMINE MONONITRATE 100 MG PO TABS
100.0000 mg | ORAL_TABLET | Freq: Every day | ORAL | Status: DC
Start: 2024-07-07 — End: 2024-07-11
  Administered 2024-07-07 – 2024-07-11 (×5): 100 mg via ORAL
  Filled 2024-07-06 (×5): qty 1

## 2024-07-06 MED ORDER — POTASSIUM CHLORIDE 20 MEQ PO PACK
20.0000 meq | PACK | Freq: Every day | ORAL | Status: DC
  Administered 2024-07-06 – 2024-07-11 (×6): 20 meq via ORAL
  Filled 2024-07-06 (×6): qty 1

## 2024-07-06 MED ORDER — CHLORHEXIDINE GLUCONATE CLOTH 2 % EX PADS
6.0000 | MEDICATED_PAD | Freq: Every day | CUTANEOUS | Status: DC
  Administered 2024-07-06 – 2024-07-11 (×6): 6 via TOPICAL

## 2024-07-06 MED ORDER — ASPIRIN 81 MG PO CHEW
81.0000 mg | CHEWABLE_TABLET | Freq: Every day | ORAL | Status: DC
  Administered 2024-07-07 – 2024-07-11 (×5): 81 mg via ORAL
  Filled 2024-07-06 (×5): qty 1

## 2024-07-06 MED ORDER — LEVOTHYROXINE SODIUM 50 MCG PO TABS
75.0000 ug | ORAL_TABLET | Freq: Every morning | ORAL | Status: DC
  Administered 2024-07-07: 75 ug via ORAL
  Filled 2024-07-06: qty 3
  Filled 2024-07-06: qty 1.5

## 2024-07-06 MED ORDER — OMEPRAZOLE MAGNESIUM 20 MG PO TBEC: 20.0000 mg | DELAYED_RELEASE_TABLET | Freq: Every day | ORAL | Status: DC

## 2024-07-06 MED ORDER — SODIUM CHLORIDE 0.9 % IV SOLN
2.0000 g | INTRAVENOUS | Status: DC
  Administered 2024-07-06 – 2024-07-10 (×5): 2 g via INTRAVENOUS
  Filled 2024-07-06 (×5): qty 12.5

## 2024-07-06 MED ORDER — POTASSIUM CHLORIDE 10 MEQ/100ML IV SOLN
10.0000 meq | INTRAVENOUS | Status: AC
  Administered 2024-07-06 (×4): 10 meq via INTRAVENOUS
  Filled 2024-07-06 (×4): qty 100

## 2024-07-06 MED ORDER — ACETAMINOPHEN 650 MG RE SUPP: 650.0000 mg | Freq: Four times a day (QID) | RECTAL | Status: DC | PRN

## 2024-07-06 MED ORDER — MELATONIN 3 MG PO TABS
3.0000 mg | ORAL_TABLET | Freq: Every day | ORAL | Status: DC
  Administered 2024-07-06 – 2024-07-10 (×5): 3 mg via ORAL
  Filled 2024-07-06 (×5): qty 1

## 2024-07-06 MED ORDER — BUSPIRONE HCL 5 MG PO TABS
5.0000 mg | ORAL_TABLET | Freq: Three times a day (TID) | ORAL | Status: DC
  Administered 2024-07-06 – 2024-07-11 (×14): 5 mg via ORAL
  Filled 2024-07-06 (×14): qty 1

## 2024-07-06 MED ORDER — VANCOMYCIN HCL IN DEXTROSE 1-5 GM/200ML-% IV SOLN
1000.0000 mg | Freq: Once | INTRAVENOUS | Status: AC
  Administered 2024-07-06: 1000 mg via INTRAVENOUS
  Filled 2024-07-06: qty 200

## 2024-07-06 MED ORDER — POLYETHYLENE GLYCOL 3350 17 G PO PACK
17.0000 g | PACK | Freq: Every morning | ORAL | Status: DC
  Administered 2024-07-07 – 2024-07-10 (×4): 17 g via ORAL
  Filled 2024-07-06 (×4): qty 1

## 2024-07-06 MED ORDER — OXYBUTYNIN CHLORIDE 5 MG PO TABS
5.0000 mg | ORAL_TABLET | Freq: Two times a day (BID) | ORAL | Status: DC
  Administered 2024-07-06 – 2024-07-11 (×10): 5 mg via ORAL
  Filled 2024-07-06 (×10): qty 1

## 2024-07-06 MED ORDER — IPRATROPIUM-ALBUTEROL 0.5-2.5 (3) MG/3ML IN SOLN
3.0000 mL | RESPIRATORY_TRACT | Status: DC | PRN
  Administered 2024-07-09: 3 mL via RESPIRATORY_TRACT
  Filled 2024-07-06: qty 3

## 2024-07-06 MED ORDER — METHYLPREDNISOLONE SODIUM SUCC 125 MG IJ SOLR
80.0000 mg | Freq: Once | INTRAMUSCULAR | Status: AC
  Administered 2024-07-06: 80 mg via INTRAVENOUS
  Filled 2024-07-06: qty 2

## 2024-07-06 MED ORDER — HEPARIN SODIUM (PORCINE) 5000 UNIT/ML IJ SOLN
5000.0000 [IU] | Freq: Three times a day (TID) | INTRAMUSCULAR | Status: DC
  Administered 2024-07-06 – 2024-07-11 (×14): 5000 [IU] via SUBCUTANEOUS
  Filled 2024-07-06 (×14): qty 1

## 2024-07-06 MED ORDER — DIAZEPAM 2 MG PO TABS
2.0000 mg | ORAL_TABLET | Freq: Two times a day (BID) | ORAL | Status: DC
  Administered 2024-07-06 – 2024-07-11 (×10): 2 mg via ORAL
  Filled 2024-07-06 (×10): qty 1

## 2024-07-06 MED ORDER — VANCOMYCIN HCL 500 MG/100ML IV SOLN
500.0000 mg | INTRAVENOUS | Status: DC
  Administered 2024-07-08: 500 mg via INTRAVENOUS
  Filled 2024-07-06: qty 100

## 2024-07-06 MED ORDER — PANTOPRAZOLE SODIUM 40 MG PO TBEC
40.0000 mg | DELAYED_RELEASE_TABLET | Freq: Every day | ORAL | Status: DC
  Administered 2024-07-07 – 2024-07-11 (×5): 40 mg via ORAL
  Filled 2024-07-06 (×5): qty 1

## 2024-07-06 MED ORDER — LORATADINE 10 MG PO TABS
10.0000 mg | ORAL_TABLET | Freq: Every day | ORAL | Status: DC
  Administered 2024-07-06 – 2024-07-10 (×5): 10 mg via ORAL
  Filled 2024-07-06 (×5): qty 1

## 2024-07-06 MED ORDER — HYDROCODONE-ACETAMINOPHEN 5-325 MG PO TABS
1.0000 | ORAL_TABLET | Freq: Three times a day (TID) | ORAL | Status: DC | PRN
  Administered 2024-07-07 – 2024-07-11 (×10): 1 via ORAL
  Filled 2024-07-06 (×10): qty 1

## 2024-07-06 MED ORDER — METHYLPREDNISOLONE SODIUM SUCC 125 MG IJ SOLR
80.0000 mg | Freq: Three times a day (TID) | INTRAMUSCULAR | Status: AC
  Administered 2024-07-07 (×2): 80 mg via INTRAVENOUS
  Filled 2024-07-06 (×2): qty 2

## 2024-07-06 MED ORDER — PIPERACILLIN-TAZOBACTAM 3.375 G IVPB 30 MIN
3.3750 g | Freq: Once | INTRAVENOUS | Status: AC
Start: 2024-07-06 — End: 2024-07-06
  Administered 2024-07-06: 3.375 g via INTRAVENOUS
  Filled 2024-07-06: qty 50

## 2024-07-06 MED ORDER — METRONIDAZOLE 500 MG/100ML IV SOLN
500.0000 mg | Freq: Two times a day (BID) | INTRAVENOUS | Status: DC
  Administered 2024-07-06 – 2024-07-11 (×10): 500 mg via INTRAVENOUS
  Filled 2024-07-06 (×10): qty 100

## 2024-07-06 MED ORDER — LACTATED RINGERS IV SOLN: 150.0000 mL/h | INTRAVENOUS | Status: DC

## 2024-07-06 MED ORDER — MIDODRINE HCL 5 MG PO TABS
10.0000 mg | ORAL_TABLET | Freq: Three times a day (TID) | ORAL | Status: DC
  Administered 2024-07-06 – 2024-07-10 (×12): 10 mg via ORAL
  Filled 2024-07-06 (×12): qty 2

## 2024-07-06 MED ORDER — POLYVINYL ALCOHOL 1.4 % OP SOLN: 2.0000 [drp] | OPHTHALMIC | Status: DC | PRN

## 2024-07-06 MED ORDER — CARBOXYMETHYLCELLULOSE SODIUM 1 % OP SOLN: 2.0000 [drp] | OPHTHALMIC | Status: DC | PRN

## 2024-07-06 MED ORDER — IPRATROPIUM-ALBUTEROL 0.5-2.5 (3) MG/3ML IN SOLN
3.0000 mL | Freq: Once | RESPIRATORY_TRACT | Status: AC
Start: 2024-07-06 — End: 2024-07-06
  Administered 2024-07-06: 3 mL via RESPIRATORY_TRACT
  Filled 2024-07-06: qty 3

## 2024-07-06 NOTE — Progress Notes (Addendum)
 Pharmacy Antibiotic Note  Jordan Ward is a 83 y.o. male admitted on 07/06/2024 with HCAP.  Pharmacy has been consulted for Vancomycin  and cefepime  dosing. AKI Plan: Vancomycin  1000 mg IV loading dose, then 500mg  IV Q 36 hrs. Goal AUC 400-550. Expected AUC: 537 SCr used: 2.91 Cefepime  2gm IV q24h F/U cxs and clinical progress Monitor V/S, labs and levels as indicated   Height: 5' 5 (165.1 cm) Weight: 50.9 kg (112 lb 3.2 oz) IBW/kg (Calculated) : 61.5  Temp (24hrs), Avg:98.9 F (37.2 C), Min:98.9 F (37.2 C), Max:98.9 F (37.2 C)  Recent Labs  Lab 07/06/24 1456  WBC 23.9*  CREATININE 2.91*  LATICACIDVEN 2.8*    Estimated Creatinine Clearance: 14.3 mL/min (A) (by C-G formula based on SCr of 2.91 mg/dL (H)).    Allergies  Allergen Reactions   Ciprofloxacin  Other (See Comments)    Trouble swallowing according to wife Clemens out in the floor Pt can tolerate Levaquin    Tetracyclines & Related Anaphylaxis and Rash    Antimicrobials this admission: Vancomycin  10/8 >>  Cefepime  10/8 >>  Zosyn  x 1 in ED 10/8  Microbiology results: 10/8 BCx: pending  MRSA PCR:   Thank you for allowing pharmacy to be a part of this patient's care.  Willian Donson, BS Pharm D, BCPS Clinical Pharmacist 07/06/2024 4:42 PM

## 2024-07-06 NOTE — H&P (Signed)
 [facility] H&P   Patient Demographics:    Jordan Ward, is a 83 y.o. male  MRN: [id number]   DOB - May 09, 1941  Admit Date - 07/06/2024  Outpatient Primary MD for the patient is Jordan Norleen PEDLAR, MD  Referring MD/NP/PA: PA Rigney  Patient coming from: [facility]  Chief Complaint  Patient presents with   Abnormal Lab   Shortness of Breath   Weakness      HPI:    Jordan Ward  is a 83 y.o. male,with medical history significant of pulmonary fibrosis/COPD, CAD, prior stroke, multiple sclerosis with neurogenic bladder, has had a longstanding suprapubic tube with atrophic right kidney and a left percutaneous nephrostomy tube, hypothyroidism, pressure ulcers, pneumonia, long-term resident at [facility]. - Patient sent from his facility due to complaints of shortness of breath, cough, for last 3 days, he is on room air at baseline, he is with progressive dyspnea started to require oxygen , no chest pain, no fever, no chills, no nausea, no vomiting, he does report cough intermittently with eating, he has chronic suprapubic catheter and left nephrostomy tube. - Patient initially on 8 L oxygen  by EMS, but he is currently on 4 L oxygen , ABG significant for low PaO2, chest x-ray significant for left lung opacity, labs significant for low potassium at 3.1, creatinine at 2.9, up from baseline of 1.94, he has significant leukocytosis at 23K, lactic acid elevated at 2.8, troponins elevated at 236, and his proBNP elevated at 21 393, patient started on IV fluids, antibiotics and [facility] consulted to admit    Review of systems:     A full 10 point Review of Systems was done, except as stated above, all other Review of Systems were negative.   With Past History of the following :    Past Medical History:  Diagnosis Date   Anemia    Arthritis    Back pain, chronic    Bilateral carotid bruits     C. difficile colitis 09/2011   CAD (coronary artery disease)    Carotid artery stenosis    Cerebrovascular disease    Cerebrovascular disease 08/17/2018   Colon polyps    Complication of cystostomy catheter, initial encounter 04/20/2020   Dyslipidemia    Dysphagia 10/07/2011   Encephalopathy    Gait disorder    HA (headache)    High grade dysplasia in colonic adenoma 09/2005   History of kidney stones    HTN (hypertension), malignant 10/06/2011   Hypernatremia    Hypokalemia    Hypothyroidism 10/08/2011   Insomnia    Junctional rhythm    Kidney stones    MS (multiple sclerosis)    Nausea and vomiting 03/02/2020   Neuromuscular disorder (HCC)    MS   OSA (obstructive sleep apnea)    Paroxysmal atrial tachycardia    Peripheral vascular disease    Pneumonia 4 yrs ago  Pulmonary fibrosis (HCC) 10/06/2011   Pulmonary nodule 10/08/2011   PVD (peripheral vascular disease)    Sacral ulcer (HCC)    Sleep apnea    cannot tolerate   Stroke [facility])    left sided weakness   Suprapubic catheter (HCC)    TIA (transient ischemic attack)    Tremors of nervous system 10/08/2011   Urinary tract infection    Ventral hernia without obstruction or gangrene     Large 8X9cm ventral hernia with loss of domain. CT reads report as diastasis recti with herniation or diastasis recti.  Dr. Kallie, Surgery, reviewed CT with radiology and there is herniation with only hernia sac or peritoneum over the bowel and large separation of the rectus muslce (i.e. diastasis recti aka loss of domain).  No surgical intervention recommended given size, age, and health.       Past Surgical History:  Procedure Laterality Date   APPENDECTOMY  09/2005   at time of left hemicolectomy   BACK SURGERY  1976/1979   lower   BILIARY DILATION N/A 03/03/2020   Procedure: BILIARY DILATION;  Surgeon: Golda Claudis PENNER, MD;  [facility];  Service: Endoscopy;  Laterality: N/A;   CATARACT EXTRACTION W/PHACO Right  03/08/2018   Procedure: CATARACT EXTRACTION PHACO AND INTRAOCULAR LENS PLACEMENT RIGHT EYE;  Surgeon: Perley Hamilton, MD;  Location: [facility];  Service: Ophthalmology;  Laterality: Right;  CDE: 8.86   CATARACT EXTRACTION W/PHACO Left 04/05/2018   Procedure: CATARACT EXTRACTION PHACO AND INTRAOCULAR LENS PLACEMENT (IOC);  Surgeon: Perley Hamilton, MD;  Location: [facility];  Service: Ophthalmology;  Laterality: Left;  CDE: 7.36   CENTRAL LINE INSERTION-TUNNELED Right 09/11/2020   Procedure: PLACEMENT OF TUNNELED CENTRAL LINE INTO JUGULAR VEIN;  Surgeon: Kallie Manuelita BROCKS, MD;  Location: [facility];  Service: General;  Laterality: Right;   CHOLECYSTECTOMY     Dr. Claudene   COLON SURGERY  09/2005   Fleishman: four tubular adenomas, large adenomatous polyp with HIGH GRADE dysplasia   COLONOSCOPY  11/2004   Dr. Lendon sessile polyp splenic flexure, 10mm sessile polyp desc colon, tubulovillous adenoma (bx not removed)   COLONOSCOPY  01/2005   poor prep, polyp could not be found   COLONOSCOPY  05/2005   with EMR, polypectomy Dr. Norleen Dec, bx showed high grade dysplasia, partially resected   COLONOSCOPY  09/2005   Dr. Dec, [place] ink tattooing, four villous colon polyp (3 had been missed on previous colonoscopies due to limitations of procedures   COLONOSCOPY  09/2006   normal TI, no polyps   COLONOSCOPY  10/2007   Dr. Randol distal mammillations, benign bx, normal TI, random bx neg for microscopic colitis   CYSTOSCOPY W/ URETERAL STENT PLACEMENT Left 09/05/2020   Procedure: CYSTOSCOPY WITH RETROGRADE PYELOGRAM/URETERAL STENT PLACEMENT;  Surgeon: Cam Morene ORN, MD;  Location: [facility];  Service: Urology;  Laterality: Left;   CYSTOSCOPY WITH LITHOLAPAXY N/A 07/27/2018   Procedure: CYSTOSCOPY WITH LITHOLAPAXY VIA  SUPRAPUBIC TUBE;  Surgeon: Matilda Senior, MD;  Location: [facility];  Service: Urology;  Laterality: N/A;   CYSTOSCOPY WITH RETROGRADE PYELOGRAM, URETEROSCOPY AND STENT PLACEMENT Left  06/09/2017   Procedure: CYSTOSCOPY WITH LEFT RETROGRADE PYELOGRAM, LEFT URETEROSCOPY, LEFT URETEROSCOPIC STONE EXTRACTION, LEFT URETERAL STENT PLACEMENT;  Surgeon: Matilda Senior, MD;  Location: [facility];  Service: Urology;  Laterality: Left;   CYSTOSCOPY/URETEROSCOPY/HOLMIUM LASER/STENT PLACEMENT Left 05/22/2020   Procedure: CYSTOSCOPY/URETEROSCOPY/STENT PLACEMENT;  Surgeon: Matilda Senior, MD;  Location: [facility];  Service: Urology;  Laterality: Left;   ERCP  N/A 03/03/2020   Procedure: ENDOSCOPIC RETROGRADE CHOLANGIOPANCREATOGRAPHY (ERCP);  Surgeon: Golda Claudis PENNER, MD;  [facility];  Service: Endoscopy;  Laterality: N/A;   ERCP N/A 04/20/2020   Procedure: ENDOSCOPIC RETROGRADE CHOLANGIOPANCREATOGRAPHY (ERCP);  Surgeon: Golda Claudis PENNER, MD;  [facility];  Service: Endoscopy;  Laterality: N/A;  to be done at 7:30am in OR   GASTROINTESTINAL STENT REMOVAL N/A 04/20/2020   Procedure: STENT REMOVAL;  Surgeon: Golda Claudis PENNER, MD;  [facility];  Service: Endoscopy;  Laterality: N/A;   INGUINAL HERNIA REPAIR  1971   bilateral   INSERTION OF SUPRAPUBIC CATHETER  06/09/2017   Procedure: EXCHANGE OF SUPRAPUBIC CATHETER;  Surgeon: Matilda Senior, MD;  Location: [facility];  Service: Urology;;   INSERTION OF SUPRAPUBIC CATHETER  05/22/2020   Procedure: SUPRAPUBIC CATHETER EXCHANGE;  Surgeon: Matilda Senior, MD;  Location: [facility];  Service: Urology;;   IR NEPHROSTOMY EXCHANGE LEFT  03/25/2021   IR NEPHROSTOMY EXCHANGE LEFT  05/20/2021   IR NEPHROSTOMY EXCHANGE LEFT  07/13/2021   IR NEPHROSTOMY EXCHANGE LEFT  09/09/2021   IR NEPHROSTOMY EXCHANGE LEFT  11/04/2021   IR NEPHROSTOMY EXCHANGE LEFT  12/23/2021   IR NEPHROSTOMY EXCHANGE LEFT  02/17/2022   IR NEPHROSTOMY EXCHANGE LEFT  04/14/2022   IR NEPHROSTOMY EXCHANGE LEFT  06/09/2022   IR NEPHROSTOMY EXCHANGE LEFT  07/28/2022   IR NEPHROSTOMY EXCHANGE LEFT  09/09/2022   IR NEPHROSTOMY EXCHANGE LEFT  11/04/2022   IR  NEPHROSTOMY EXCHANGE LEFT  12/30/2022   IR NEPHROSTOMY EXCHANGE LEFT  02/25/2023   IR NEPHROSTOMY EXCHANGE LEFT  03/31/2023   IR NEPHROSTOMY EXCHANGE LEFT  05/26/2023   IR NEPHROSTOMY EXCHANGE LEFT  07/21/2023   IR NEPHROSTOMY EXCHANGE LEFT  09/15/2023   IR NEPHROSTOMY EXCHANGE LEFT  12/22/2023   IR NEPHROSTOMY EXCHANGE LEFT  03/25/2024   IR NEPHROSTOMY EXCHANGE LEFT  04/28/2024   IR NEPHROSTOMY EXCHANGE LEFT  06/02/2024   IR NEPHROSTOMY PLACEMENT LEFT  05/26/2017   IR NEPHROSTOMY PLACEMENT LEFT  05/02/2020   IR NEPHROSTOMY PLACEMENT LEFT  02/15/2021   IR NEPHROSTOMY PLACEMENT LEFT  02/22/2024   IR NEPHROSTOMY TUBE CHANGE  10/29/2023   IR URETERAL STENT PERC REMOVAL MOD SED  03/25/2021   KIDNEY STONE SURGERY  09/13/2015   LITHOTRIPSY N/A 03/03/2020   Procedure: MECHANICAL LITHOTRIPSY WITH REMOVAL OF MULTIPLE STONE FRAGMENTS;  Surgeon: Golda Claudis PENNER, MD;  [facility];  Service: Endoscopy;  Laterality: N/A;   NEPHROLITHOTOMY Left 09/13/2015   Procedure: LEFT PERCUTANEOUS NEPHROLITHOTOMY ;  Surgeon: Senior Matilda, MD;  Location: [facility];  Service: Urology;  Laterality: Left;   NEPHROSTOMY TUBE REMOVAL  05/22/2020   Procedure: NEPHROSTOMY TUBE REMOVAL;  Surgeon: Matilda Senior, MD;  Location: [facility];  Service: Urology;;   [facility] REMOVAL Right 09/20/2020   Procedure: MINOR REMOVAL CENTRAL LINE;  Surgeon: Kallie Manuelita BROCKS, MD;  Location: [facility];  Service: General;  Laterality: Right;  Pt to arrive at 7:30am for procedure   REMOVAL OF STONES N/A 04/20/2020   Procedure: REMOVAL OF STONES;  Surgeon: Golda Claudis PENNER, MD;  [facility];  Service: Endoscopy;  Laterality: N/A;   SPHINCTEROTOMY  03/03/2020   Procedure: BILLARY SPHINCTEROTOMY;  Surgeon: Golda Claudis PENNER, MD;  [facility];  Service: Endoscopy;;   SUPRAPUBIC CATHETER INSERTION        Social History:     Social History   Tobacco Use   Smoking status: Former    Current packs/day:  0.00    Average  packs/day: 1 pack/day for 25.0 years (25.0 ttl pk-yrs)    Types: Cigarettes    Start date: 03/04/1963    Quit date: 03/03/1988    Years since quitting: 36.3   Smokeless tobacco: Never  Substance Use Topics   Alcohol  use: No      Family History :     Family History  Problem Relation Age of Onset   Cirrhosis Brother        etoh   Stroke Mother 13   Coronary artery disease Father 4   Heart attack Brother    Cancer Sister    Multiple sclerosis Other    Colon cancer Neg Hx      Home Medications:   Prior to Admission medications   Medication Sig Start Date End Date Taking? Authorizing Provider  acetaminophen  (TYLENOL ) 325 MG tablet Take 650 mg by mouth every 4 (four) hours as needed for mild pain (pain score 1-3), moderate pain (pain score 4-6) or fever.    [provider]  Amino Acids-Protein Hydrolys (PRO-STAT PROFILE PO) Take 30 mLs by mouth 2 (two) times daily.    [provider]  amLODipine  (NORVASC ) 5 MG tablet Take 1 tablet (5 mg total) by mouth daily. 02/03/24   Pearlean Manus, MD  ascorbic acid  (VITAMIN C) 500 MG tablet Take 500 mg by mouth every morning.    [provider]  aspirin  81 MG chewable tablet Chew 1 tablet (81 mg total) by mouth daily with breakfast. 02/02/24   Emokpae, Courage, MD  busPIRone (BUSPAR) 5 MG tablet Take 5 mg by mouth 3 (three) times daily.    [provider]  carboxymethylcellulose 1 % ophthalmic solution Place 2 drops into both eyes every 4 (four) hours as needed (dry eyes).    [provider]  cephALEXin  (KEFLEX ) 500 MG capsule Take 1 capsule (500 mg total) by mouth 4 (four) times daily. 02/22/24   Dasie Faden, MD  diazepam  (VALIUM ) 2 MG tablet Take 1 tablet (2 mg total) by mouth 2 (two) times daily. 02/02/24   Pearlean Manus, MD  HYDROcodone -acetaminophen  (NORCO/VICODIN) 5-325 MG tablet Take 1 tablet by mouth every 8 (eight) hours as needed for moderate pain (pain score 4-6). 02/02/24   Pearlean Manus, MD   levofloxacin  (LEVAQUIN ) 500 MG tablet Take 1 tablet (500 mg total) by mouth See admin instructions. Every other day (Q 48 hrs) -starting 02/03/24 (02/03/24, 05/09, 05/11, 05/13 and 02/11/24) 02/02/24   Pearlean Manus, MD  levothyroxine  (SYNTHROID ) 75 MCG tablet Take 75 mcg by mouth in the morning. 07/02/21   [provider]  melatonin 3 MG TABS tablet Take 3 mg by mouth daily as needed (sleep).    [provider]  methocarbamol  (ROBAXIN ) 500 MG tablet Take 500 mg by mouth every 6 (six) hours as needed for muscle spasms.    [provider]  mirabegron  ER (MYRBETRIQ ) 50 MG TB24 tablet Take 1 tablet (50 mg total) by mouth daily. Patient not taking: Reported on 05/17/2024 10/12/23   Maree Bracken D, DO  mirtazapine  (REMERON ) 15 MG tablet Take 0.5 tablets (7.5 mg total) by mouth at bedtime. 02/02/24   Pearlean Manus, MD  Multiple Vitamin (MULTIVITAMIN) tablet Take 1 tablet by mouth every morning.    [provider]  oxybutynin  (DITROPAN -XL) 5 MG 24 hr tablet Take 5 mg by mouth every morning.    [provider]  OXYGEN  Inhale 1 L into the lungs every evening. Patient not taking:  Reported on 05/17/2024    [provider]  pantoprazole  (PROTONIX ) 40 MG tablet Take 1 tablet (40 mg total) by mouth daily. 10/12/23   Maree, Pratik D, DO  polyethylene glycol (MIRALAX  / GLYCOLAX ) 17 g packet Take 17 g by mouth every morning.    [provider]  Potassium Bicarb-Citric Acid 20 MEQ TBEF Take 20 mEq by mouth every morning.    [provider]  senna-docusate (SENOKOT-S) 8.6-50 MG tablet Take 2 tablets by mouth at bedtime. 02/02/24   Pearlean Manus, MD  sodium chloride  (OCEAN) 0.65 % SOLN nasal spray Place 1 spray into both nostrils in the morning, at noon, in the evening, and at bedtime.    [provider]  thiamine  (VITAMIN B-1) 100 MG tablet Take 1 tablet (100 mg total) by mouth daily. 10/12/23   Maree Bracken D, DO     Allergies:      Allergies  Allergen Reactions   Ciprofloxacin  Other (See Comments)    Trouble swallowing according to wife Clemens out in the floor Pt can tolerate Levaquin    Tetracyclines & Related Anaphylaxis and Rash     Physical Exam:   Vitals  Blood pressure (!) 96/47, pulse 97, temperature 98.9 F (37.2 C), temperature source Oral, resp. rate (!) 24, height 5' 5 (1.651 m), weight 50.9 kg, SpO2 94%.   1. General Frail, chronically ill-appearing male, laying in bed, no apparent distress, deconditioned  2. Normal affect and insight, Not Suicidal or Homicidal, Awake Alert, Oriented X 3.  3. No F.N deficits, ALL C.Nerves Intact, Strength 5/5 all 4 extremities, Sensation intact all 4 extremities, Plantars down going.  4. Ears and Eyes appear Normal, Conjunctivae clear, PERRLA. Moist Oral Mucosa.  5. Supple Neck,  No cervical lymphadenopathy appriciated, No Carotid Bruits.  6. Symmetrical Chest Danis movement, mildly, Rales and crackles in the right lung  7. RRR, No Gallops, Rubs or Murmurs, No Parasternal Heave.  8. Positive Bowel Sounds, Abdomen Soft, No tenderness, No organomegaly appriciated,No rebound -guarding or rigidity.  Suprapubic Foley catheter and nephrostomy present  9.  No Cyanosis, sacral pressure ulcer bandaged  10.  Significant muscle wasting    Data Review:    CBC Recent Labs  Lab 07/06/24 1456  WBC 23.9*  HGB 10.3*  HCT 33.2*  PLT 291  MCV 86.9  MCH 27.0  MCHC 31.0  RDW 15.6*  LYMPHSABS 3.8  MONOABS 0.8  EOSABS 0.1  BASOSABS 0.1   ------------------------------------------------------------------------------------------------------------------  Chemistries  Recent Labs  Lab 07/06/24 1456  NA 139  K 3.1*  CL 102  CO2 20*  GLUCOSE 122*  BUN 56*  CREATININE 2.91*  CALCIUM  9.4  AST 24  ALT 10  ALKPHOS 124  BILITOT 0.4    ------------------------------------------------------------------------------------------------------------------ estimated creatinine clearance is 14.3 mL/min (A) (by C-G formula based on SCr of 2.91 mg/dL (H)). ------------------------------------------------------------------------------------------------------------------ No results for input(s): TSH, T4TOTAL, T3FREE, THYROIDAB in the last 72 hours.  Invalid input(s): FREET3  Coagulation profile No results for input(s): INR, PROTIME in the last 168 hours. ------------------------------------------------------------------------------------------------------------------- No results for input(s): DDIMER in the last 72 hours. -------------------------------------------------------------------------------------------------------------------  Cardiac Enzymes No results for input(s): CKMB, TROPONINI, MYOGLOBIN in the last 168 hours.  Invalid input(s): CK ------------------------------------------------------------------------------------------------------------------    Component Value Date/Time   BNP 488.0 (H) 09/29/2023 1411     ---------------------------------------------------------------------------------------------------------------  Urinalysis    Component Value Date/Time   COLORURINE YELLOW 02/22/2024 0032   APPEARANCEUR TURBID (A) 02/22/2024 0032   LABSPEC 1.014 02/22/2024 0032  PHURINE 5.0 02/22/2024 0032   GLUCOSEU NEGATIVE 02/22/2024 0032   HGBUR SMALL (A) 02/22/2024 0032   BILIRUBINUR NEGATIVE 02/22/2024 0032   BILIRUBINUR small (A) 10/26/2021 1538   KETONESUR NEGATIVE 02/22/2024 0032   PROTEINUR 100 (A) 02/22/2024 0032   UROBILINOGEN 0.2 10/26/2021 1538   UROBILINOGEN 0.2 07/17/2014 1215   NITRITE NEGATIVE 02/22/2024 0032   LEUKOCYTESUR LARGE (A) 02/22/2024 0032    ----------------------------------------------------------------------------------------------------------------    Imaging Results:    DG Chest Port 1 View Result Date: 07/06/2024 CLINICAL DATA:  Dyspnea.  Cough. EXAM: PORTABLE CHEST 1 VIEW COMPARISON:  02/22/2024. FINDINGS: Patchy densities in the mid and lower right chest are again noted. It is difficult to exclude slightly increased densities in these areas, particularly in the right mid lung region. Coarse lung markings appear stable. Heart and mediastinum are grossly stable. Atherosclerotic calcifications at the aortic arch. Negative for pneumothorax. No acute bone abnormality. IMPRESSION: 1. Chronic changes in the right lung. Difficult to exclude acute on chronic disease in this area. Electronically Signed   By: Juliene Balder M.D.   On: 07/06/2024 15:37     EKG:  Vent. rate 97 BPM PR interval 150 ms QRS duration 125 ms QT/QTcB 426/542 ms P-R-T axes 47 53 -60 Sinus rhythm Atrial premature complex Left bundle branch block   Assessment and plan   Principal Problem:   Acute hypoxemic respiratory failure (HCC) Active Problems:   Chronic diastolic (congestive) heart failure (HCC)   Nephrostomy present (HCC)   Dyslipidemia   HTN (hypertension), malignant    Sepsis, POA Acute respiratory failure with hypoxemia Pneumonia -Significant for sepsis POA, elevated lactic acid, soft blood pressure and leukocytosis - Concern for pneumonia, likely aspiration as he reports cough. - Follow-up blood culture, sputum culture, Legionella antigen and strep pneumoniae antigen - Will consult SLP due to concern for aspiration - Keep on broad-spectrum antibiotic vancomycin , cefepime  and Flagyl  - Follow urine analysis - Blood pressure is soft, known CHF with significantly elevated proBNP, no much room for IV fluids, will start on midodrine, and if needed can do peripheral pressors  AKI CKD stage III -baseline creatinine is 2, currently 2.9 -Avoid nephrotoxic medications, continue with IV fluids -Avoid hypotension  Hypokalemia -Replaced  Prolonged   QTc -Correct hypokalemia and check magnesium   Chronic systolic/diastolic CHF Elevated troponins Evaded proBNP -Echo in January 2025 significant for low EF 30 to 35% -proBNP significantly elevated, but he appears to be euvolemic, so we will hold on diuresis specially he presents with sepsis. - Monitor closely as on IV fluids given sepsis, but will use judicially when low EF - Elevated troponin due to AKI and sepsis, he denies any chest pain   COPD Pulmonary fibrosis - COPD and pulmonary fibrosis at baseline, but main concern is infectious process given elevated lactic acid and leukocytosis - Give 1 dose of IV steroids. - Continue with scheduled DuoNebs and as needed albuterol   GERD - Continue with PPI  Hypothyroidism -continue levothyroxine    Deconditioning/multiple sclerosis - Patient reports he is wheelchair dependent  -Consult PT/OT  Pressure ulcers -Not appear to be infected, will consult wound care     DVT Prophylaxis Heparin    AM Labs Ordered, also please review Full Orders  Family Communication: Admission, patients condition and plan of care including tests being ordered have been discussed with the patient who indicate understanding and agree with the plan and Code Status.  Code Status full code  Likely DC to back to SNF [facility] called:  None  Admission status: Inpatient  Time spent in minutes : 75 minutes   Brayton Lye M.D on 07/06/2024 at 4:28 PM   [facility] - Office  [phone]

## 2024-07-06 NOTE — ED Notes (Signed)
 Attempted to call report

## 2024-07-06 NOTE — ED Triage Notes (Addendum)
 Patient BIB [facility] from [facility], EMS called out for abnormal labs. Not informed what labs. Patient stated symptoms started Monday, Shortness of breath , Cough that is productive Weakness. Given duoneb yesterday at facility and started on oxygen . Per EMS patient was on 8L via nasal cannula, ems tried pt on 4L satting at 92% given a ned treatment. A7o x4. Has a suprapubic cath, and a nephrostomy tube to left side.

## 2024-07-06 NOTE — ED Provider Notes (Signed)
 [place] EMERGENCY DEPARTMENT AT [facility] Provider Note   CSN: [id number] Arrival date & time: 07/06/24  1359     Patient presents with: Abnormal Lab, Shortness of Breath, and Weakness   Jordan Ward is a 83 y.o. male.   Patient is an 83 year old male who presents to Emergency Department with a chief complaint of shortness of breath, productive cough for the past 3 days.  Jordan Ward notes Jordan Ward does not wear oxygen  on a regular basis but notes that Jordan Ward has required oxygen  over the past 2 days.  Jordan Ward denies any associated abdominal pain, nausea, vomiting, diarrhea.  Jordan Ward does have a suprapubic catheter and nephrostomy tube in place.  Jordan Ward denies any chest pain, headache, pain to neck or back.  There is been no associated dizziness, lightheadedness or syncope.  Jordan Ward admits to generalized malaise and fatigue.  Cough has been productive of a thick yellow sputum.   Abnormal Lab Shortness of Breath Weakness Associated symptoms: shortness of breath        Prior to Admission medications   Medication Sig Start Date End Date Taking? Authorizing Provider  acetaminophen  (TYLENOL ) 325 MG tablet Take 650 mg by mouth every 4 (four) hours as needed for mild pain (pain score 1-3), moderate pain (pain score 4-6) or fever.    [provider]  Amino Acids-Protein Hydrolys (PRO-STAT PROFILE PO) Take 30 mLs by mouth 2 (two) times daily.    [provider]  amLODipine  (NORVASC ) 5 MG tablet Take 1 tablet (5 mg total) by mouth daily. 02/03/24   Pearlean Manus, MD  ascorbic acid  (VITAMIN C) 500 MG tablet Take 500 mg by mouth every morning.    [provider]  aspirin  81 MG chewable tablet Chew 1 tablet (81 mg total) by mouth daily with breakfast. 02/02/24   Emokpae, Courage, MD  busPIRone (BUSPAR) 5 MG tablet Take 5 mg by mouth 3 (three) times daily.    [provider]  carboxymethylcellulose 1 % ophthalmic solution Place 2 drops into both eyes every 4 (four) hours as needed (dry  eyes).    [provider]  cephALEXin  (KEFLEX ) 500 MG capsule Take 1 capsule (500 mg total) by mouth 4 (four) times daily. 02/22/24   Dasie Faden, MD  diazepam  (VALIUM ) 2 MG tablet Take 1 tablet (2 mg total) by mouth 2 (two) times daily. 02/02/24   Pearlean Manus, MD  HYDROcodone -acetaminophen  (NORCO/VICODIN) 5-325 MG tablet Take 1 tablet by mouth every 8 (eight) hours as needed for moderate pain (pain score 4-6). 02/02/24   Pearlean Manus, MD  levofloxacin  (LEVAQUIN ) 500 MG tablet Take 1 tablet (500 mg total) by mouth See admin instructions. Every other day (Q 48 hrs) -starting 02/03/24 (02/03/24, 05/09, 05/11, 05/13 and 02/11/24) 02/02/24   Pearlean Manus, MD  levothyroxine  (SYNTHROID ) 75 MCG tablet Take 75 mcg by mouth in the morning. 07/02/21   [provider]  melatonin 3 MG TABS tablet Take 3 mg by mouth daily as needed (sleep).    [provider]  methocarbamol  (ROBAXIN ) 500 MG tablet Take 500 mg by mouth every 6 (six) hours as needed for muscle spasms.    [provider]  mirabegron  ER (MYRBETRIQ ) 50 MG TB24 tablet Take 1 tablet (50 mg total) by mouth daily. Patient not taking: Reported on 05/17/2024 10/12/23   Maree Bracken D, DO  mirtazapine  (REMERON ) 15 MG tablet Take 0.5 tablets (7.5 mg total) by mouth at bedtime. 02/02/24   Pearlean Manus, MD  Multiple Vitamin (  MULTIVITAMIN) tablet Take 1 tablet by mouth every morning.    [provider]  oxybutynin  (DITROPAN -XL) 5 MG 24 hr tablet Take 5 mg by mouth every morning.    [provider]  OXYGEN  Inhale 1 L into the lungs every evening. Patient not taking: Reported on 05/17/2024    [provider]  pantoprazole  (PROTONIX ) 40 MG tablet Take 1 tablet (40 mg total) by mouth daily. 10/12/23   Maree, Pratik D, DO  polyethylene glycol (MIRALAX  / GLYCOLAX ) 17 g packet Take 17 g by mouth every morning.    [provider]  Potassium Bicarb-Citric Acid 20 MEQ TBEF Take 20 mEq by mouth  every morning.    [provider]  senna-docusate (SENOKOT-S) 8.6-50 MG tablet Take 2 tablets by mouth at bedtime. 02/02/24   Pearlean Manus, MD  sodium chloride  (OCEAN) 0.65 % SOLN nasal spray Place 1 spray into both nostrils in the morning, at noon, in the evening, and at bedtime.    [provider]  thiamine  (VITAMIN B-1) 100 MG tablet Take 1 tablet (100 mg total) by mouth daily. 10/12/23   Maree, Pratik D, DO    Allergies: Ciprofloxacin  and Tetracyclines & related    Review of Systems  Respiratory:  Positive for shortness of breath.   Neurological:  Positive for weakness.  All other systems reviewed and are negative.   Updated Vital Signs BP 127/75   Pulse 97   Temp 98.9 F (37.2 C) (Oral)   Resp (!) 22   Ht 5' 5 (1.651 m)   Wt 50.9 kg   SpO2 93%   BMI 18.67 kg/m   Physical Exam Vitals and nursing note reviewed.  Constitutional:      General: Jordan Ward is not in acute distress.    Appearance: Normal appearance. Jordan Ward is not ill-appearing.  HENT:     Head: Normocephalic and atraumatic.     Nose: Nose normal.     Mouth/Throat:     Mouth: Mucous membranes are moist.  Eyes:     Extraocular Movements: Extraocular movements intact.     Conjunctiva/sclera: Conjunctivae normal.     Pupils: Pupils are equal, round, and reactive to light.  Cardiovascular:     Rate and Rhythm: Normal rate and regular rhythm.     Pulses: Normal pulses.     Heart sounds: Normal heart sounds. No murmur heard.    No gallop.  Pulmonary:     Effort: Pulmonary effort is normal. No tachypnea.     Breath sounds: Wheezing and rhonchi present. No decreased breath sounds or rales.  Chest:     Chest Siemen: No tenderness.  Abdominal:     General: Abdomen is flat. Bowel sounds are normal.     Palpations: Abdomen is soft.     Tenderness: There is no abdominal tenderness. There is no guarding.  Musculoskeletal:        General: Normal range of motion.     Cervical back: Normal range of motion and  neck supple.     Right lower leg: No edema.     Left lower leg: No edema.  Skin:    General: Skin is warm and dry.     Findings: No erythema or rash.  Neurological:     General: No focal deficit present.     Mental Status: Jordan Ward is alert and oriented to person, place, and time. Mental status is at baseline.  Psychiatric:        Mood and Affect: Mood normal.  Behavior: Behavior normal.        Thought Content: Thought content normal.        Judgment: Judgment normal.     (all labs ordered are listed, but only abnormal results are displayed) Labs Reviewed  CULTURE, BLOOD (ROUTINE X 2)  CULTURE, BLOOD (ROUTINE X 2)  RESP PANEL BY RT-PCR (RSV, FLU A&B, COVID)  [id number]  COMPREHENSIVE METABOLIC PANEL WITH GFR  LACTIC ACID, PLASMA  LACTIC ACID, PLASMA  CBC WITH DIFFERENTIAL/PLATELET  URINALYSIS, ROUTINE W REFLEX MICROSCOPIC  BLOOD GAS, VENOUS  TROPONIN T, HIGH SENSITIVITY    EKG: None  Radiology: No results found.   .Critical Care  Performed by: Daralene Lonni BIRCH, PA-C Authorized by: Daralene Lonni BIRCH, PA-C   Critical care provider statement:    Critical care time (minutes):  35   Critical care was necessary to treat or prevent imminent or life-threatening deterioration of the following conditions:  Respiratory failure and sepsis   Critical care was time spent personally by me on the following activities:  Development of treatment plan with patient or surrogate, discussions with consultants, evaluation of patient's response to treatment, examination of patient, ordering and review of laboratory studies, ordering and review of radiographic studies, ordering and performing treatments and interventions, pulse oximetry, re-evaluation of patient's condition and review of old charts   I assumed direction of critical care for this patient from another provider in my specialty: no     Care discussed with: admitting provider      Medications Ordered in the ED   piperacillin -tazobactam (ZOSYN ) IVPB 3.375 g (has no administration in time range)  ipratropium-albuterol  (DUONEB) 0.5-2.5 (3) MG/3ML nebulizer solution 3 mL (has no administration in time range)  methylPREDNISolone  sodium succinate (SOLU-MEDROL ) 125 mg/2 mL injection 80 mg (has no administration in time range)    Clinical Course as of 07/06/24 1555  Wed Jul 06, 2024  1540 This is an 83-year male presenting from a nursing facility with concern for productive cough general malaise.  Jordan Ward has an indwelling Foley.  Patient is hypoxic requiring 4 L of nasal cannula, has a wet productive cough, has malodorous urine with an indwelling Foley.  Initial labs show a leukocytosis.  No significant acidosis.  BNP is also elevated.  Chest x-ray showing chronic underlying lung disease, likely pulmonary edema pattern, difficulties clued possible infectious process.  Patient is being covered broad-spectrum antibiotic with Zosyn  for both potential urinary tract infection or pneumonia.  Jordan Ward is pending remaining labs and likely medical admission for sepsis.  Given concerns for volume overload and congestive heart failure, Jordan Ward has been given judicious fluids at this time - infusion ordered.   [MT]    Clinical Course User Index [MT] Trifan, Donnice PARAS, MD                                 Medical Decision Making Amount and/or Complexity of Data Reviewed Labs: ordered. Radiology: ordered.  Risk Prescription drug management. Decision regarding hospitalization.   This patient presents to the ED for concern of cough, congestion, shortness of breath, this involves an extensive number of treatment options, and is a complaint that carries with it a high risk of complications and morbidity.  The differential diagnosis includes pneumonia, acute viral syndrome, COPD exacerbation, CHF, ACS, pulmonary embolus   Co morbidities that complicate the patient evaluation  Emphysema   Additional history obtained:  Additional  history obtained from medical records  External records from outside source obtained and reviewed including medical records   Lab Tests:  I Ordered, and personally interpreted labs.  The pertinent results include: Leukocytosis, anemia at baseline, mild elevation in creatinine, normal liver function, hypokalemia, elevated lactic acid, negative viral swab, elevated troponin and BNP   Imaging Studies ordered:  I ordered imaging studies including chest x-ray I independently visualized and interpreted imaging which showed right lower lobe pneumonia I agree with the radiologist interpretation   Cardiac Monitoring: / EKG:  The patient was maintained on a cardiac monitor.  I personally viewed and interpreted the cardiac monitored which showed an underlying rhythm of: Normal sinus rhythm, no ST/T wave changes, no ischemic changes, no STEMI   Consultations Obtained:  I requested consultation with the hospitalist,  and discussed lab and imaging findings as well as pertinent plan - they recommend: Admission   Problem List / ED Course / Critical interventions / Medication management  Patient is doing well at this time and does remain stable.  Discussed with patient and family that we will plan for admission to the hospital service repair pneumonia at this point.  Did cover with Zosyn  with concerns for aspiration.  Patient is currently requiring 4 L nasal cannula which she does not wear at home.  Patient does meet sepsis criteria at this point.  Have avoided full liter bolus and Jordan Ward is currently receiving slow fluids at this time given his BNP of 21,000 to avoid fluid overload.  Jordan Ward has no hypotension at this point and Jordan Ward is not tachycardic.  Have discussed patient case with Dr. Sherlon with the hospital service who has excepted for admission. I ordered medication including Zosyn , IV fluids, DuoNeb, Solu-Medrol , potassium for sepsis, pneumonia, COPD, hypokalemia Reevaluation of the patient after these  medicines showed that the patient improved I have reviewed the patients home medicines and have made adjustments as needed   Social Determinants of Health:  None   Test / Admission - Considered:  Admission     Final diagnoses:  None    ED Discharge Orders     None          Daralene Lonni BIRCH, PA-C 07/06/24 1615    Cottie Donnice PARAS, MD 07/06/24 1757

## 2024-07-06 NOTE — ED Notes (Signed)
 Nurse checked pt bag for urine.

## 2024-07-06 NOTE — ED Notes (Signed)
Cath bag changed. 

## 2024-07-06 NOTE — ED Notes (Addendum)
 ED TO INPATIENT HANDOFF REPORT  ED Nurse Name and Phone #: Emmaleigh Longo  S Name/Age/Gender Jordan Ward 83 y.o. male Room/Bed: APA07/APA07  Code Status   Code Status: Full Code  Home/SNF/Other [facility] Patient oriented to: self, place, time, and situation Is this baseline? Yes   Triage Complete: Triage complete  Chief Complaint Acute respiratory failure with hypoxia (HCC) [J96.01] Acute hypoxemic respiratory failure (HCC) [J96.01]  Triage Note Patient BIB [facility] from [facility], EMS called out for abnormal labs. Not informed what labs. Patient stated symptoms started Monday, Shortness of breath , Cough that is productive Weakness. Given duoneb yesterday at facility and started on oxygen . Per EMS patient was on 8L via nasal cannula, ems tried pt on 4L satting at 92% given a ned treatment. A7o x4. Has a suprapubic cath, and a nephrostomy tube to left side.   Allergies Allergies  Allergen Reactions   Ciprofloxacin  Other (See Comments)    Trouble swallowing according to wife Clemens out in the floor Pt can tolerate Levaquin    Tetracyclines & Related Anaphylaxis and Rash    Level of Care/Admitting Diagnosis ED Disposition     ED Disposition  Admit   Condition  --   Comment  Hospital Area: [facility] [[id number]]  Level of Care: Stepdown [14]  Covid Evaluation: Confirmed COVID Negative  Diagnosis: Acute hypoxemic respiratory failure [facility]) [[id number]]  Admitting Physician: SHERLON BRAYTON GORMAN RANDA  Attending Physician: SHERLON, DAWOOD S [[id number]]  Certification:: I certify this patient will need inpatient services for at least 2 midnights  Expected Medical Readiness: 07/09/2024          B Medical/Surgery History Past Medical History:  Diagnosis Date   Anemia    Arthritis    Back pain, chronic    Bilateral carotid bruits    C. difficile colitis 09/2011   CAD (coronary artery disease)    Carotid artery stenosis    Cerebrovascular disease     Cerebrovascular disease 08/17/2018   Colon polyps    Complication of cystostomy catheter, initial encounter 04/20/2020   Dyslipidemia    Dysphagia 10/07/2011   Encephalopathy    Gait disorder    HA (headache)    High grade dysplasia in colonic adenoma 09/2005   History of kidney stones    HTN (hypertension), malignant 10/06/2011   Hypernatremia    Hypokalemia    Hypothyroidism 10/08/2011   Insomnia    Junctional rhythm    Kidney stones    MS (multiple sclerosis)    Nausea and vomiting 03/02/2020   Neuromuscular disorder (HCC)    MS   OSA (obstructive sleep apnea)    Paroxysmal atrial tachycardia    Peripheral vascular disease    Pneumonia 4 yrs ago   Pulmonary fibrosis (HCC) 10/06/2011   Pulmonary nodule 10/08/2011   PVD (peripheral vascular disease)    Sacral ulcer (HCC)    Sleep apnea    cannot tolerate   Stroke (HCC)    left sided weakness   Suprapubic catheter (HCC)    TIA (transient ischemic attack)    Tremors of nervous system 10/08/2011   Urinary tract infection    Ventral hernia without obstruction or gangrene     Large 8X9cm ventral hernia with loss of domain. CT reads report as diastasis recti with herniation or diastasis recti.  Dr. Kallie, Surgery, reviewed CT with radiology and there is herniation with only hernia sac or peritoneum over the bowel and large separation of the rectus muslce (i.e. diastasis recti  aka loss of domain).  No surgical intervention recommended given size, age, and health.    Past Surgical History:  Procedure Laterality Date   APPENDECTOMY  09/2005   at time of left hemicolectomy   BACK SURGERY  1976/1979   lower   BILIARY DILATION N/A 03/03/2020   Procedure: BILIARY DILATION;  Surgeon: Golda Claudis PENNER, MD;  [facility];  Service: Endoscopy;  Laterality: N/A;   CATARACT EXTRACTION W/PHACO Right 03/08/2018   Procedure: CATARACT EXTRACTION PHACO AND INTRAOCULAR LENS PLACEMENT RIGHT EYE;  Surgeon: Perley Hamilton, MD;  Location:  [facility];  Service: Ophthalmology;  Laterality: Right;  CDE: 8.86   CATARACT EXTRACTION W/PHACO Left 04/05/2018   Procedure: CATARACT EXTRACTION PHACO AND INTRAOCULAR LENS PLACEMENT (IOC);  Surgeon: Perley Hamilton, MD;  Location: [facility];  Service: Ophthalmology;  Laterality: Left;  CDE: 7.36   CENTRAL LINE INSERTION-TUNNELED Right 09/11/2020   Procedure: PLACEMENT OF TUNNELED CENTRAL LINE INTO JUGULAR VEIN;  Surgeon: Kallie Manuelita BROCKS, MD;  Location: [facility];  Service: General;  Laterality: Right;   CHOLECYSTECTOMY     Dr. Claudene   COLON SURGERY  09/2005   Fleishman: four tubular adenomas, large adenomatous polyp with HIGH GRADE dysplasia   COLONOSCOPY  11/2004   Dr. Lendon sessile polyp splenic flexure, 10mm sessile polyp desc colon, tubulovillous adenoma (bx not removed)   COLONOSCOPY  01/2005   poor prep, polyp could not be found   COLONOSCOPY  05/2005   with EMR, polypectomy Dr. Norleen Dec, bx showed high grade dysplasia, partially resected   COLONOSCOPY  09/2005   Dr. Dec, [place] ink tattooing, four villous colon polyp (3 had been missed on previous colonoscopies due to limitations of procedures   COLONOSCOPY  09/2006   normal TI, no polyps   COLONOSCOPY  10/2007   Dr. Randol distal mammillations, benign bx, normal TI, random bx neg for microscopic colitis   CYSTOSCOPY W/ URETERAL STENT PLACEMENT Left 09/05/2020   Procedure: CYSTOSCOPY WITH RETROGRADE PYELOGRAM/URETERAL STENT PLACEMENT;  Surgeon: Cam Morene ORN, MD;  Location: [facility];  Service: Urology;  Laterality: Left;   CYSTOSCOPY WITH LITHOLAPAXY N/A 07/27/2018   Procedure: CYSTOSCOPY WITH LITHOLAPAXY VIA  SUPRAPUBIC TUBE;  Surgeon: Matilda Senior, MD;  Location: [facility];  Service: Urology;  Laterality: N/A;   CYSTOSCOPY WITH RETROGRADE PYELOGRAM, URETEROSCOPY AND STENT PLACEMENT Left 06/09/2017   Procedure: CYSTOSCOPY WITH LEFT RETROGRADE PYELOGRAM, LEFT URETEROSCOPY, LEFT URETEROSCOPIC STONE EXTRACTION, LEFT URETERAL  STENT PLACEMENT;  Surgeon: Matilda Senior, MD;  Location: [facility];  Service: Urology;  Laterality: Left;   CYSTOSCOPY/URETEROSCOPY/HOLMIUM LASER/STENT PLACEMENT Left 05/22/2020   Procedure: CYSTOSCOPY/URETEROSCOPY/STENT PLACEMENT;  Surgeon: Matilda Senior, MD;  Location: [facility];  Service: Urology;  Laterality: Left;   ERCP N/A 03/03/2020   Procedure: ENDOSCOPIC RETROGRADE CHOLANGIOPANCREATOGRAPHY (ERCP);  Surgeon: Golda Claudis PENNER, MD;  [facility];  Service: Endoscopy;  Laterality: N/A;   ERCP N/A 04/20/2020   Procedure: ENDOSCOPIC RETROGRADE CHOLANGIOPANCREATOGRAPHY (ERCP);  Surgeon: Golda Claudis PENNER, MD;  [facility];  Service: Endoscopy;  Laterality: N/A;  to be done at 7:30am in OR   GASTROINTESTINAL STENT REMOVAL N/A 04/20/2020   Procedure: STENT REMOVAL;  Surgeon: Golda Claudis PENNER, MD;  [facility];  Service: Endoscopy;  Laterality: N/A;   INGUINAL HERNIA REPAIR  1971   bilateral   INSERTION OF SUPRAPUBIC CATHETER  06/09/2017   Procedure: EXCHANGE OF SUPRAPUBIC CATHETER;  Surgeon: Matilda Senior, MD;  Location: [facility];  Service: Urology;;   INSERTION  OF SUPRAPUBIC CATHETER  05/22/2020   Procedure: SUPRAPUBIC CATHETER EXCHANGE;  Surgeon: Matilda Senior, MD;  Location: [facility];  Service: Urology;;   IR NEPHROSTOMY EXCHANGE LEFT  03/25/2021   IR NEPHROSTOMY EXCHANGE LEFT  05/20/2021   IR NEPHROSTOMY EXCHANGE LEFT  07/13/2021   IR NEPHROSTOMY EXCHANGE LEFT  09/09/2021   IR NEPHROSTOMY EXCHANGE LEFT  11/04/2021   IR NEPHROSTOMY EXCHANGE LEFT  12/23/2021   IR NEPHROSTOMY EXCHANGE LEFT  02/17/2022   IR NEPHROSTOMY EXCHANGE LEFT  04/14/2022   IR NEPHROSTOMY EXCHANGE LEFT  06/09/2022   IR NEPHROSTOMY EXCHANGE LEFT  07/28/2022   IR NEPHROSTOMY EXCHANGE LEFT  09/09/2022   IR NEPHROSTOMY EXCHANGE LEFT  11/04/2022   IR NEPHROSTOMY EXCHANGE LEFT  12/30/2022   IR NEPHROSTOMY EXCHANGE LEFT  02/25/2023   IR NEPHROSTOMY EXCHANGE LEFT  03/31/2023   IR NEPHROSTOMY  EXCHANGE LEFT  05/26/2023   IR NEPHROSTOMY EXCHANGE LEFT  07/21/2023   IR NEPHROSTOMY EXCHANGE LEFT  09/15/2023   IR NEPHROSTOMY EXCHANGE LEFT  12/22/2023   IR NEPHROSTOMY EXCHANGE LEFT  03/25/2024   IR NEPHROSTOMY EXCHANGE LEFT  04/28/2024   IR NEPHROSTOMY EXCHANGE LEFT  06/02/2024   IR NEPHROSTOMY PLACEMENT LEFT  05/26/2017   IR NEPHROSTOMY PLACEMENT LEFT  05/02/2020   IR NEPHROSTOMY PLACEMENT LEFT  02/15/2021   IR NEPHROSTOMY PLACEMENT LEFT  02/22/2024   IR NEPHROSTOMY TUBE CHANGE  10/29/2023   IR URETERAL STENT PERC REMOVAL MOD SED  03/25/2021   KIDNEY STONE SURGERY  09/13/2015   LITHOTRIPSY N/A 03/03/2020   Procedure: MECHANICAL LITHOTRIPSY WITH REMOVAL OF MULTIPLE STONE FRAGMENTS;  Surgeon: Golda Claudis PENNER, MD;  [facility];  Service: Endoscopy;  Laterality: N/A;   NEPHROLITHOTOMY Left 09/13/2015   Procedure: LEFT PERCUTANEOUS NEPHROLITHOTOMY ;  Surgeon: Senior Matilda, MD;  Location: [facility];  Service: Urology;  Laterality: Left;   NEPHROSTOMY TUBE REMOVAL  05/22/2020   Procedure: NEPHROSTOMY TUBE REMOVAL;  Surgeon: Matilda Senior, MD;  Location: [facility];  Service: Urology;;   [facility] REMOVAL Right 09/20/2020   Procedure: MINOR REMOVAL CENTRAL LINE;  Surgeon: Kallie Manuelita BROCKS, MD;  Location: [facility];  Service: General;  Laterality: Right;  Pt to arrive at 7:30am for procedure   REMOVAL OF STONES N/A 04/20/2020   Procedure: REMOVAL OF STONES;  Surgeon: Golda Claudis PENNER, MD;  [facility];  Service: Endoscopy;  Laterality: N/A;   SPHINCTEROTOMY  03/03/2020   Procedure: BILLARY SPHINCTEROTOMY;  Surgeon: Golda Claudis PENNER, MD;  [facility];  Service: Endoscopy;;   SUPRAPUBIC CATHETER INSERTION       A IV Location/Drains/Wounds Patient Lines/Drains/Airways Status     Active Line/Drains/Airways     Name Placement date Placement time Site Days   Peripheral IV 02/21/24 22 G 1 Left;Medial Forearm 02/21/24  2357  Forearm  136   Peripheral IV 07/06/24 22 G  Right;Posterior Forearm 07/06/24  1533  Forearm  less than 1   Nephrostomy Left 10.2 Fr. 06/02/24  1440  Left  34   Suprapubic Catheter 24 Fr. --  --  --  --            Intake/Output Last 24 hours No intake or output data in the 24 hours ending 07/06/24 1834  Labs/Imaging Results for orders placed or performed during the hospital encounter of 07/06/24 (from the past 48 hours)  Pro Brain natriuretic peptide     Status: Abnormal   Collection Time: 07/06/24  2:45 PM  Result Value Ref Range  Pro Brain Natriuretic Peptide 21,393.0 (H) <300.0 pg/mL    Comment: (NOTE) Age Group        Cut-Points    Interpretation  < 50 years     450 pg/mL       NT-proBNP > 450 pg/mL indicates                                ADHF is likely              50 to 75 years  900 pg/mL      NT-proBNP > 900 pg/mL indicates          ADHF is likely  > 75 years      1800 pg/mL     NT-proBNP > 1800 pg/mL indicates          ADHF is likely                           All ages    Results between       Indeterminate. Further clinical             300 and the cut-   information is needed to determine            point for age group   if ADHF is present.                                                             Elecsys proBNP II/ Elecsys proBNP II STAT           Cut-Point                       Interpretation  300 pg/mL                    NT-proBNP <300pg/mL indicates                             ADHF is not likely  Performed at [facility], [address], [place], [place] [address]   Resp panel by RT-PCR (RSV, Flu A&B, Covid) Anterior Nasal Swab     Status: None   Collection Time: 07/06/24  2:50 PM   Specimen: Anterior Nasal Swab  Result Value Ref Range   SARS Coronavirus 2 by RT PCR NEGATIVE NEGATIVE    Comment: (NOTE) SARS-CoV-2 target nucleic acids are NOT DETECTED.  The SARS-CoV-2 RNA is generally detectable in upper respiratory specimens during the acute phase of infection. The lowest concentration of  SARS-CoV-2 viral copies this assay can detect is 138 copies/mL. A negative result does not preclude SARS-Cov-2 infection and should not be used as the sole basis for treatment or other patient management decisions. A negative result may occur with  improper specimen collection/handling, submission of specimen other than nasopharyngeal swab, presence of viral mutation(s) within the areas targeted by this assay, and inadequate number of viral copies(<138 copies/mL). A negative result must be combined with clinical observations, patient history, and epidemiological information. The expected result is Negative.  Fact Sheet for Patients:  BloggerCourse.com  Fact Sheet for Healthcare Providers:  SeriousBroker.it  This test  is no t yet approved or cleared by the [place]  FDA and  has been authorized for detection and/or diagnosis of SARS-CoV-2 by FDA under an Emergency Use Authorization (EUA). This EUA will remain  in effect (meaning this test can be used) for the duration of the COVID-19 declaration under Section 564(b)(1) of the Act, 21 U.S.C.section 360bbb-3(b)(1), unless the authorization is terminated  or revoked sooner.       Influenza A by PCR NEGATIVE NEGATIVE   Influenza B by PCR NEGATIVE NEGATIVE    Comment: (NOTE) The Xpert Xpress SARS-CoV-2/FLU/RSV plus assay is intended as an aid in the diagnosis of influenza from Nasopharyngeal swab specimens and should not be used as a sole basis for treatment. Nasal washings and aspirates are unacceptable for Xpert Xpress SARS-CoV-2/FLU/RSV testing.  Fact Sheet for Patients: BloggerCourse.com  Fact Sheet for Healthcare Providers: SeriousBroker.it  This test is not yet approved or cleared by the [place]  FDA and has been authorized for detection and/or diagnosis of SARS-CoV-2 by FDA under an Emergency Use Authorization (EUA).  This EUA will remain in effect (meaning this test can be used) for the duration of the COVID-19 declaration under Section 564(b)(1) of the Act, 21 U.S.C. section 360bbb-3(b)(1), unless the authorization is terminated or revoked.     Resp Syncytial Virus by PCR NEGATIVE NEGATIVE    Comment: (NOTE) Fact Sheet for Patients: BloggerCourse.com  Fact Sheet for Healthcare Providers: SeriousBroker.it  This test is not yet approved or cleared by the [place]  FDA and has been authorized for detection and/or diagnosis of SARS-CoV-2 by FDA under an Emergency Use Authorization (EUA). This EUA will remain in effect (meaning this test can be used) for the duration of the COVID-19 declaration under Section 564(b)(1) of the Act, 21 U.S.C. section 360bbb-3(b)(1), unless the authorization is terminated or revoked.  Performed at [facility], [address], [place], [place] [address]   Comprehensive metabolic panel     Status: Abnormal   Collection Time: 07/06/24  2:56 PM  Result Value Ref Range   Sodium 139 135 - 145 mmol/L   Potassium 3.1 (L) 3.5 - 5.1 mmol/L   Chloride 102 98 - 111 mmol/L   CO2 20 (L) 22 - 32 mmol/L   Glucose, Bld 122 (H) 70 - 99 mg/dL    Comment: Glucose reference range applies only to samples taken after fasting for at least 8 hours.   BUN 56 (H) 8 - 23 mg/dL   Creatinine, Ser 7.08 (H) 0.61 - 1.24 mg/dL   Calcium  9.4 8.9 - 10.3 mg/dL   Total Protein 8.5 (H) 6.5 - 8.1 g/dL   Albumin 3.3 (L) 3.5 - 5.0 g/dL   AST 24 15 - 41 U/L   ALT 10 0 - 44 U/L   Alkaline Phosphatase 124 38 - 126 U/L   Total Bilirubin 0.4 0.0 - 1.2 mg/dL   GFR, Estimated 21 (L) >60 mL/min    Comment: (NOTE) Calculated using the CKD-EPI Creatinine Equation (2021)    Anion gap 17 (H) 5 - 15    Comment: Performed at [facility], [address] [place], [place] [address]  Lactic acid, plasma     Status: Abnormal   Collection Time: 07/06/24  2:56 PM   Result Value Ref Range   Lactic Acid, Venous 2.8 (HH) 0.5 - 1.9 mmol/L    Comment: Critical Value, Read Back and verified with Ivah Girardot,L ON 07/06/24 AT 1553 BY LOY,C Performed at [facility], [address], [place],  [place] [address]   Troponin T, High Sensitivity     Status: Abnormal   Collection Time: 07/06/24  2:56 PM  Result Value Ref Range   Troponin T High Sensitivity 236 (HH) 0 - 19 ng/L    Comment: Critical Value, Read Back and verified with COZELL, A ON 07/06/24 AT 1544 BY LOY.C (NOTE) Biotin concentrations > 1000 ng/mL falsely decrease TnT results.  Serial cardiac troponin measurements are suggested.  Refer to the Links section for chest pain algorithms and additional  guidance. Performed at [facility], [address] [place], [place] [address]   CBC with Differential     Status: Abnormal   Collection Time: 07/06/24  2:56 PM  Result Value Ref Range   WBC 23.9 (H) 4.0 - 10.5 K/uL   RBC 3.82 (L) 4.22 - 5.81 MIL/uL   Hemoglobin 10.3 (L) 13.0 - 17.0 g/dL   HCT 66.7 (L) 60.9 - 47.9 %   MCV 86.9 80.0 - 100.0 fL   MCH 27.0 26.0 - 34.0 pg   MCHC 31.0 30.0 - 36.0 g/dL   RDW 84.3 (H) 88.4 - 84.4 %   Platelets 291 150 - 400 K/uL   nRBC 0.0 0.0 - 0.2 %   Neutrophils Relative % 80 %   Neutro Abs 19.0 (H) 1.7 - 7.7 K/uL   Lymphocytes Relative 16 %   Lymphs Abs 3.8 0.7 - 4.0 K/uL   Monocytes Relative 3 %   Monocytes Absolute 0.8 0.1 - 1.0 K/uL   Eosinophils Relative 0 %   Eosinophils Absolute 0.1 0.0 - 0.5 K/uL   Basophils Relative 0 %   Basophils Absolute 0.1 0.0 - 0.1 K/uL   Immature Granulocytes 1 %   Abs Immature Granulocytes 0.15 (H) 0.00 - 0.07 K/uL    Comment: Performed at [facility], [address] [place], [place] [address]  Blood gas, venous     Status: Abnormal   Collection Time: 07/06/24  2:56 PM  Result Value Ref Range   pH, Ven 7.34 7.25 - 7.43   pCO2, Ven 42 (L) 44 - 60 mmHg   pO2, Ven <31 (LL) 32 - 45 mmHg    Comment: CRITICAL RESULT CALLED TO, READ BACK  BY AND VERIFIED WITH: Anistyn Graddy,L ON 07/06/24 AT 1506 BY LOY,C    Bicarbonate 22.7 20.0 - 28.0 mmol/L   Acid-base deficit 3.0 (H) 0.0 - 2.0 mmol/L   O2 Saturation 16.9 %   Patient temperature 37.2    Collection site RIGHT ANTECUBITAL    Drawn by [phone]     Comment: Performed at [facility], [address], [place], [place] [address]  Magnesium      Status: None   Collection Time: 07/06/24  2:56 PM  Result Value Ref Range   Magnesium  1.9 1.7 - 2.4 mg/dL    Comment: Performed at [facility], [address], [place], [place] [address]  Protime-INR     Status: None   Collection Time: 07/06/24  2:56 PM  Result Value Ref Range   Prothrombin Time 14.4 11.4 - 15.2 seconds   INR 1.1 0.8 - 1.2    Comment: (NOTE) INR goal varies based on device and disease states. Performed at [facility], [address], [place], [place] [address]   APTT     Status: None   Collection Time: 07/06/24  2:56 PM  Result Value Ref Range   aPTT 32 24 - 36 seconds    Comment: Performed at [facility], [address] [place], [place]  [address]  Urinalysis, Routine w reflex microscopic -Urine, Catheterized     Status: Abnormal   Collection Time: 07/06/24  5:37 PM  Result Value Ref Range   Color, Urine YELLOW YELLOW   APPearance CLOUDY (A) CLEAR   Specific Gravity, Urine 1.014 1.005 - 1.030   pH 8.0 5.0 - 8.0   Glucose, UA NEGATIVE NEGATIVE mg/dL   Hgb urine dipstick SMALL (A) NEGATIVE   Bilirubin Urine NEGATIVE NEGATIVE   Ketones, ur NEGATIVE NEGATIVE mg/dL   Protein, ur 899 (A) NEGATIVE mg/dL   Nitrite NEGATIVE NEGATIVE   Leukocytes,Ua MODERATE (A) NEGATIVE   RBC / HPF 0-5 0 - 5 RBC/hpf   WBC, UA >50 0 - 5 WBC/hpf   Bacteria, UA FEW (A) NONE SEEN   Squamous Epithelial / HPF 0-5 0 - 5 /HPF   WBC Clumps PRESENT    Mucus PRESENT    Amorphous Crystal PRESENT     Comment: Performed at [facility], [address] [place], [place] [address]  Lactic acid, plasma     Status: None   Collection Time: 07/06/24  6:05  PM  Result Value Ref Range   Lactic Acid, Venous 1.7 0.5 - 1.9 mmol/L    Comment: Performed at [facility], [address], [place], [place] [address]   DG Chest Port 1 View Result Date: 07/06/2024 CLINICAL DATA:  Dyspnea.  Cough. EXAM: PORTABLE CHEST 1 VIEW COMPARISON:  02/22/2024. FINDINGS: Patchy densities in the mid and lower right chest are again noted. It is difficult to exclude slightly increased densities in these areas, particularly in the right mid lung region. Coarse lung markings appear stable. Heart and mediastinum are grossly stable. Atherosclerotic calcifications at the aortic arch. Negative for pneumothorax. No acute bone abnormality. IMPRESSION: 1. Chronic changes in the right lung. Difficult to exclude acute on chronic disease in this area. Electronically Signed   By: Juliene Balder M.D.   On: 07/06/2024 15:37    Pending Labs Unresulted Labs (From admission, onward)     Start     Ordered   07/07/24 0500  Basic metabolic panel  Tomorrow morning,   R        07/06/24 1627   07/07/24 0500  CBC  Tomorrow morning,   R        07/06/24 1627   07/06/24 1800  MRSA Next Gen by PCR, Nasal  (MRSA Screening)  Once,   R        07/06/24 1651   07/06/24 1639  Legionella Pneumophila Serogp 1 Ur Ag  Once,   R        07/06/24 1638   07/06/24 1639  Strep pneumoniae urinary antigen  Once,   R        07/06/24 1638   07/06/24 1639  Expectorated Sputum Assessment w Gram Stain, Rflx to Resp Cult  Once,   R        07/06/24 1638   07/06/24 1625  CBC with Differential  ONCE - STAT,   STAT        07/06/24 1625   07/06/24 1433  Culture, blood (routine x 2)  BLOOD CULTURE X 2,   R (with STAT occurrences)      07/06/24 1432            Vitals/Pain Today's Vitals   07/06/24 1730 07/06/24 1745 07/06/24 1751 07/06/24 1753  BP: 100/72 99/68    Pulse: 89 92    Resp: (!) 28 (!) 28    Temp:  TempSrc:      SpO2: 91% 93%    Weight:      Height:      PainSc:   6  6     Isolation  Precautions No active isolations  Medications Medications  lactated ringers  infusion ( Intravenous Rate/Dose Change 07/06/24 1624)  potassium chloride  10 mEq in 100 mL IVPB (10 mEq Intravenous New Bag/Given 07/06/24 1749)  heparin  injection 5,000 Units (has no administration in time range)  acetaminophen  (TYLENOL ) tablet 650 mg (650 mg Oral Given 07/06/24 1753)    Or  acetaminophen  (TYLENOL ) suppository 650 mg ( Rectal See Alternative 07/06/24 1753)  hydrALAZINE  (APRESOLINE ) injection 5 mg (has no administration in time range)  midodrine (PROAMATINE) tablet 10 mg (10 mg Oral Given 07/06/24 1749)  metroNIDAZOLE  (FLAGYL ) IVPB 500 mg (has no administration in time range)  methylPREDNISolone  sodium succinate (SOLU-MEDROL ) 125 mg/2 mL injection 80 mg (has no administration in time range)  ceFEPIme  (MAXIPIME ) 2 g in sodium chloride  0.9 % 100 mL IVPB (has no administration in time range)  vancomycin  (VANCOCIN ) IVPB 1000 mg/200 mL premix (has no administration in time range)    Followed by  vancomycin  (VANCOREADY) IVPB 500 mg/100 mL (has no administration in time range)  HYDROcodone -acetaminophen  (NORCO/VICODIN) 5-325 MG per tablet 1 tablet (has no administration in time range)  piperacillin -tazobactam (ZOSYN ) IVPB 3.375 g (0 g Intravenous Stopped 07/06/24 1618)  ipratropium-albuterol  (DUONEB) 0.5-2.5 (3) MG/3ML nebulizer solution 3 mL (3 mLs Nebulization Given 07/06/24 1509)  methylPREDNISolone  sodium succinate (SOLU-MEDROL ) 125 mg/2 mL injection 80 mg (80 mg Intravenous Given 07/06/24 1536)    Mobility manual wheelchair     Focused Assessments Pulmonary Assessment Handoff:  Lung sounds: Bilateral Breath Sounds: Diminished, Rhonchi L Breath Sounds: Rales R Breath Sounds: Rales O2 Device: Nasal Cannula O2 Flow Rate (L/min): 4 L/min    R Recommendations: See Admitting Provider Note  Report given to:   Additional Notes: SOB easily, watch BP, Hard stick, suprapubic foley and nephrostomy  left side

## 2024-07-06 NOTE — ED Notes (Signed)
 ED Provider at bedside.

## 2024-07-07 DIAGNOSIS — J9601 Acute respiratory failure with hypoxia: Secondary | ICD-10-CM | POA: Diagnosis not present

## 2024-07-07 LAB — BASIC METABOLIC PANEL WITH GFR
Anion gap: 14 (ref 5–15)
BUN: 51 mg/dL — ABNORMAL HIGH (ref 8–23)
CO2: 18 mmol/L — ABNORMAL LOW (ref 22–32)
Calcium: 8.9 mg/dL (ref 8.9–10.3)
Chloride: 107 mmol/L (ref 98–111)
Creatinine, Ser: 2.54 mg/dL — ABNORMAL HIGH (ref 0.61–1.24)
GFR, Estimated: 25 mL/min — ABNORMAL LOW (ref >60)
Glucose, Bld: 172 mg/dL — ABNORMAL HIGH (ref 70–99)
Potassium: 3.8 mmol/L (ref 3.5–5.1)
Sodium: 139 mmol/L (ref 135–145)

## 2024-07-07 LAB — MRSA NEXT GEN BY PCR, NASAL: MRSA by PCR Next Gen: DETECTED — AB

## 2024-07-07 LAB — CBC
HCT: 29.6 % — ABNORMAL LOW (ref 39.0–52.0)
Hemoglobin: 9.1 g/dL — ABNORMAL LOW (ref 13.0–17.0)
MCH: 26.5 pg (ref 26.0–34.0)
MCHC: 30.7 g/dL (ref 30.0–36.0)
MCV: 86.3 fL (ref 80.0–100.0)
Platelets: 250 K/uL (ref 150–400)
RBC: 3.43 MIL/uL — ABNORMAL LOW (ref 4.22–5.81)
RDW: 15.7 % — ABNORMAL HIGH (ref 11.5–15.5)
WBC: 14.5 K/uL — ABNORMAL HIGH (ref 4.0–10.5)
nRBC: 0 % (ref 0.0–0.2)

## 2024-07-07 MED ORDER — MUPIROCIN 2 % EX OINT
TOPICAL_OINTMENT | Freq: Two times a day (BID) | CUTANEOUS | Status: DC
  Administered 2024-07-09 – 2024-07-10 (×2): 1 via NASAL
  Filled 2024-07-07 (×4): qty 22

## 2024-07-07 MED ORDER — LEVOTHYROXINE SODIUM 75 MCG PO TABS
75.0000 ug | ORAL_TABLET | Freq: Every day | ORAL | Status: DC
  Administered 2024-07-08 – 2024-07-11 (×4): 75 ug via ORAL
  Filled 2024-07-07 (×4): qty 1

## 2024-07-07 MED ORDER — ENSURE PLUS HIGH PROTEIN PO LIQD
237.0000 mL | Freq: Two times a day (BID) | ORAL | Status: DC
  Administered 2024-07-07 – 2024-07-10 (×8): 237 mL via ORAL

## 2024-07-07 NOTE — Plan of Care (Signed)
  Problem: Acute Rehab PT Goals(only PT should resolve) Goal: Pt Will Go Supine/Side To Sit Outcome: Progressing Flowsheets (Taken 07/07/2024 1530) Pt will go Supine/Side to Sit:  with contact guard assist  with minimal assist Goal: Patient Will Transfer Sit To/From Stand Outcome: Progressing Flowsheets (Taken 07/07/2024 1530) Patient will transfer sit to/from stand: with minimal assist Goal: Pt Will Transfer Bed To Chair/Chair To Bed Outcome: Progressing Flowsheets (Taken 07/07/2024 1530) Pt will Transfer Bed to Chair/Chair to Bed: with min assist Goal: Pt Will Ambulate Outcome: Progressing Flowsheets (Taken 07/07/2024 1530) Pt will Ambulate:  15 feet  with minimal assist  with moderate assist  with rolling walker   3:30 PM, 07/07/24 Lynwood Music, MPT Physical Therapist with [facility] 336 [phone] office [phone] mobile phone

## 2024-07-07 NOTE — Plan of Care (Signed)
   Problem: Respiratory: Goal: Ability to maintain adequate ventilation will improve Outcome: Progressing

## 2024-07-07 NOTE — Evaluation (Signed)
 Physical Therapy Evaluation Patient Details Name: Jordan Ward MRN: [id number] DOB: 1941-09-27 Today's Date: 07/07/2024  History of Present Illness  Jordan Ward  is a 83 y.o. male,with medical history significant of pulmonary fibrosis/COPD, CAD, prior stroke, multiple sclerosis with neurogenic bladder, has had a longstanding suprapubic tube with atrophic right kidney and a left percutaneous nephrostomy tube, hypothyroidism, pressure ulcers, pneumonia, long-term resident at [facility].  - Patient sent from his facility due to complaints of shortness of breath, cough, for last 3 days, he is on room air at baseline, he is with progressive dyspnea started to require oxygen , no chest pain, no fever, no chills, no nausea, no vomiting, he does report cough intermittently with eating, he has chronic suprapubic catheter and left nephrostomy tube.  - Patient initially on 8 L oxygen  by EMS, but he is currently on 4 L oxygen , ABG significant for low PaO2, chest x-ray significant for left lung opacity, labs significant for low potassium at 3.1, creatinine at 2.9, up from baseline of 1.94, he has significant leukocytosis at 23K, lactic acid elevated at 2.8, troponins elevated at 236, and his proBNP elevated at 21 393, patient started on IV fluids, antibiotics and [facility] hospitalist consulted to admit   Clinical Impression  Patient demonstrates slow labored movement for sitting up at bedside, once seated unsteady  with occasional leaning backwards, presents with right foot drop (base line per patient)  and able to use RW for taking a few side steps during transfer to chair, but limited due to generalized weakness/fatigue. Patient tolerated sitting up in chair after therapy. Patient will benefit from continued skilled physical therapy in hospital and recommended venue below to increase strength, balance, endurance for safe ADLs and gait.           If plan is discharge home, recommend the following: A lot of help  with bathing/dressing/bathroom;A lot of help with walking and/or transfers;Help with stairs or ramp for entrance;Assistance with cooking/housework;Assist for transportation   Can travel by private vehicle   Yes    Equipment Recommendations None recommended by PT  Recommendations for Other Services       Functional Status Assessment Patient has had a recent decline in their functional status and demonstrates the ability to make significant improvements in function in a reasonable and predictable amount of time.     Precautions / Restrictions Precautions Precautions: Fall Recall of Precautions/Restrictions: Intact Restrictions Weight Bearing Restrictions Per Provider Order: No      Mobility  Bed Mobility Overal bed mobility: Needs Assistance Bed Mobility: Supine to Sit     Supine to sit: Mod assist, Used rails, HOB elevated     General bed mobility comments: increased time, labored movement    Transfers Overall transfer level: Needs assistance Equipment used: Rolling walker (2 wheels) Transfers: Sit to/from Stand, Bed to chair/wheelchair/BSC Sit to Stand: Mod assist   Step pivot transfers: Mod assist       General transfer comment: unsteady labored movement using RW, right foot drop (baseline)    Ambulation/Gait Ambulation/Gait assistance: Mod assist Gait Distance (Feet): 4 Feet Assistive device: Rolling walker (2 wheels) Gait Pattern/deviations: Decreased step length - right, Decreased step length - left, Decreased stride length, Decreased dorsiflexion - left, Ataxic Gait velocity: slow     General Gait Details: limitd to a few slow labored side steps with difficulty advancing RLE due to foot drop, weakness  Careers information officer  Tilt Bed    Modified Rankin (Stroke Patients Only)       Balance Overall balance assessment: Needs assistance Sitting-balance support: Feet supported, No upper extremity supported Sitting  balance-Leahy Scale: Poor Sitting balance - Comments: poor to fair seated at EOB   Standing balance support: Bilateral upper extremity supported, During functional activity, Reliant on assistive device for balance Standing balance-Leahy Scale: Poor Standing balance comment: using RW                             Pertinent Vitals/Pain Pain Assessment Pain Assessment: Faces Faces Pain Scale: Hurts whole lot Pain Location: gluteal region Pain Descriptors / Indicators: Sharp Pain Intervention(s): Limited activity within patient's tolerance, Monitored during session, Repositioned    Home Living Family/patient expects to be discharged to:: Skilled nursing facility                        Prior Function Prior Level of Function : Needs assist       Physical Assist : ADLs (physical);Mobility (physical) Mobility (physical): Transfers;Gait;Stairs;Bed mobility ADLs (physical): Bathing;Dressing;Toileting;IADLs;Grooming Mobility Comments: Pt reports he is doing assisted transfers to w/c without RW, walking very short household distances with rehab staff at SNF ADLs Comments: Reports assist with all ADL's other than feeting. Set up assist for grooming.     Extremity/Trunk Assessment   Upper Extremity Assessment Upper Extremity Assessment: Defer to OT evaluation RUE Deficits / Details: 4-/5 shoulder flexion; reports weakness in shoulder ever since picc line. LUE Deficits / Details: 4/5 shoulder flexion; Generally weak.    Lower Extremity Assessment Lower Extremity Assessment: Generalized weakness;RLE deficits/detail RLE Deficits / Details: grossly 3/5 except right ankle dorsiflexion 0/5 which is baseline per patient RLE Sensation: decreased light touch RLE Coordination: decreased fine motor    Cervical / Trunk Assessment Cervical / Trunk Assessment: Kyphotic  Communication   Communication Communication: No apparent difficulties    Cognition Arousal:  Alert Behavior During Therapy: WFL for tasks assessed/performed   PT - Cognitive impairments: No apparent impairments                         Following commands: Intact       Cueing Cueing Techniques: Verbal cues, Tactile cues     General Comments      Exercises     Assessment/Plan    PT Assessment Patient needs continued PT services  PT Problem List Decreased strength;Decreased activity tolerance;Decreased balance;Decreased mobility;Decreased coordination;Impaired sensation       PT Treatment Interventions DME instruction;Gait training;Stair training;Functional mobility training;Therapeutic activities;Therapeutic exercise;Balance training;Patient/family education    PT Goals (Current goals can be found in the Care Plan section)  Acute Rehab PT Goals Patient Stated Goal: return home PT Goal Formulation: With patient Time For Goal Achievement: 07/21/24 Potential to Achieve Goals: Good    Frequency Min 3X/week     Co-evaluation PT/OT/SLP Co-Evaluation/Treatment: Yes Reason for Co-Treatment: To address functional/ADL transfers PT goals addressed during session: Mobility/safety with mobility;Balance;Proper use of DME OT goals addressed during session: ADL's and self-care       AM-PAC PT 6 Clicks Mobility  Outcome Measure Help needed turning from your back to your side while in a flat bed without using bedrails?: A Little Help needed moving from lying on your back to sitting on the side of a flat bed without using bedrails?: A Lot Help needed moving to and from a  bed to a chair (including a wheelchair)?: A Lot Help needed standing up from a chair using your arms (e.g., wheelchair or bedside chair)?: A Lot Help needed to walk in hospital room?: A Lot Help needed climbing 3-5 steps with a railing? : Total 6 Click Score: 12    End of Session   Activity Tolerance: Patient tolerated treatment well;Patient limited by fatigue Patient left: in chair;with call  bell/phone within reach Nurse Communication: Mobility status PT Visit Diagnosis: Unsteadiness on feet (R26.81);Other abnormalities of gait and mobility (R26.89);Muscle weakness (generalized) (M62.81)    Time: 8977-8962 PT Time Calculation (min) (ACUTE ONLY): 15 min   Charges:   PT Evaluation $PT Eval Low Complexity: 1 Low PT Treatments $Therapeutic Activity: 8-22 mins PT General Charges $$ ACUTE PT VISIT: 1 Visit         3:28 PM, 07/07/24 Lynwood Music, MPT Physical Therapist with [facility] 336 [phone] office [phone] mobile phone

## 2024-07-07 NOTE — Evaluation (Signed)
 Occupational Therapy Evaluation Patient Details Name: Jordan Ward MRN: [id number] DOB: 1941-07-24 Today's Date: 07/07/2024   History of Present Illness   Jordan Ward  is a 83 y.o. male,with medical history significant of pulmonary fibrosis/COPD, CAD, prior stroke, multiple sclerosis with neurogenic bladder, has had a longstanding suprapubic tube with atrophic right kidney and a left percutaneous nephrostomy tube, hypothyroidism, pressure ulcers, pneumonia, long-term resident at [facility].  - Patient sent from his facility due to complaints of shortness of breath, cough, for last 3 days, he is on room air at baseline, he is with progressive dyspnea started to require oxygen , no chest pain, no fever, no chills, no nausea, no vomiting, he does report cough intermittently with eating, he has chronic suprapubic catheter and left nephrostomy tube.  - Patient initially on 8 L oxygen  by EMS, but he is currently on 4 L oxygen , ABG significant for low PaO2, chest x-ray significant for left lung opacity, labs significant for low potassium at 3.1, creatinine at 2.9, up from baseline of 1.94, he has significant leukocytosis at 23K, lactic acid elevated at 2.8, troponins elevated at 236, and his proBNP elevated at 21 393, patient started on IV fluids, antibiotics and [facility] hospitalist consulted to admit (per MD)     Clinical Impressions Pt agreeable to OT and PT co-evaluation. Pt reports assist for most ADL's at baseline while at SNF. Today pt demonstrated need for mod A for bed mobility and for EOB to chair transfer with RW. Based on patients B UE strength and A/ROM it seems as though he could be at a higher level of ADL function than he reports being at the SNF. Pt has full A/ROM with more weakness in R UE compared to L. Pt left in the chair with call bell within reach. Pt will benefit from continued OT in the hospital to increase strength, balance, and endurance for safe ADL's.        If plan is discharge  home, recommend the following:   A lot of help with walking and/or transfers;A lot of help with bathing/dressing/bathroom;Assistance with cooking/housework;Assist for transportation;Help with stairs or ramp for entrance     Functional Status Assessment   Patient has had a recent decline in their functional status and demonstrates the ability to make significant improvements in function in a reasonable and predictable amount of time.     Equipment Recommendations   None recommended by OT            Precautions/Restrictions   Precautions Precautions: Fall Recall of Precautions/Restrictions: Intact Restrictions Weight Bearing Restrictions Per Provider Order: No     Mobility Bed Mobility Overal bed mobility: Needs Assistance Bed Mobility: Supine to Sit     Supine to sit: Mod assist, Used rails, HOB elevated     General bed mobility comments: labored movement; assist for trunk control to pull to sit.    Transfers Overall transfer level: Needs assistance Equipment used: Rolling walker (2 wheels) Transfers: Sit to/from Stand, Bed to chair/wheelchair/BSC Sit to Stand: Mod assist     Step pivot transfers: Mod assist     General transfer comment: EOB to chair with RW      Balance Overall balance assessment: Needs assistance Sitting-balance support: Feet supported, Bilateral upper extremity supported Sitting balance-Leahy Scale: Fair Sitting balance - Comments: poor to fair seated at EOB   Standing balance support: Bilateral upper extremity supported, During functional activity, Reliant on assistive device for balance Standing balance-Leahy Scale: Poor Standing balance comment: using  RW                           ADL either performed or assessed with clinical judgement   ADL Overall ADL's : Needs assistance/impaired     Grooming: Set up;Sitting;Contact guard assist   Upper Body Bathing: Set up;Contact guard assist;Sitting   Lower Body  Bathing: Maximal assistance;Total assistance;Bed level   Upper Body Dressing : Set up;Contact guard assist;Sitting   Lower Body Dressing: Maximal assistance;Total assistance;Bed level;Sitting/lateral leans   Toilet Transfer: Moderate assistance;Stand-pivot;Rolling walker (2 wheels) Toilet Transfer Details (indicate cue type and reason): EOB to chair with RW Toileting- Clothing Manipulation and Hygiene: Maximal assistance;Total assistance;Bed level               Vision Baseline Vision/History: 1 Wears glasses Ability to See in Adequate Light: 1 Impaired Patient Visual Report: No change from baseline Vision Assessment?: No apparent visual deficits;Wears glasses for reading     Perception Perception: Not tested       Praxis Praxis: Not tested       Pertinent Vitals/Pain Pain Assessment Pain Assessment: 0-10 Pain Score: 10-Worst pain ever Pain Location: gluteal region Pain Descriptors / Indicators: Sharp Pain Intervention(s): Limited activity within patient's tolerance, Monitored during session, Repositioned     Extremity/Trunk Assessment Upper Extremity Assessment Upper Extremity Assessment: Generalized weakness;RUE deficits/detail;LUE deficits/detail RUE Deficits / Details: 4-/5 shoulder flexion; reports weakness in shoulder ever since picc line. LUE Deficits / Details: 4/5 shoulder flexion; Generally weak.   Lower Extremity Assessment Lower Extremity Assessment: Defer to PT evaluation   Cervical / Trunk Assessment Cervical / Trunk Assessment: Kyphotic   Communication Communication Communication: No apparent difficulties   Cognition Arousal: Alert Behavior During Therapy: WFL for tasks assessed/performed Cognition: No apparent impairments                               Following commands: Intact       Cueing  General Comments   Cueing Techniques: Verbal cues;Tactile cues                 Home Living Family/patient expects to be  discharged to:: Skilled nursing facility                                        Prior Functioning/Environment Prior Level of Function : Needs assist       Physical Assist : ADLs (physical);Mobility (physical) Mobility (physical): Transfers;Gait;Stairs ADLs (physical): Bathing;Dressing;Toileting;IADLs;Grooming Mobility Comments: Pt reports he is doing assisted transfers to w/c without RW. ADLs Comments: Reports assist with all ADL's other than feeting. Set up assist for grooming.    OT Problem List: Decreased strength;Decreased activity tolerance;Impaired balance (sitting and/or standing);Pain   OT Treatment/Interventions: Self-care/ADL training;Therapeutic exercise;Therapeutic activities;Balance training;Patient/family education;DME and/or AE instruction      OT Goals(Current goals can be found in the care plan section)   Acute Rehab OT Goals Patient Stated Goal: Reduce pain. OT Goal Formulation: With patient Time For Goal Achievement: 07/21/24 Potential to Achieve Goals: Good   OT Frequency:  Min 2X/week    Co-evaluation PT/OT/SLP Co-Evaluation/Treatment: Yes Reason for Co-Treatment: To address functional/ADL transfers   OT goals addressed during session: ADL's and self-care  End of Session Equipment Utilized During Treatment: Rolling walker (2 wheels)  Activity Tolerance: Patient tolerated treatment well Patient left: in chair;with call bell/phone within reach  OT Visit Diagnosis: Unsteadiness on feet (R26.81);Other abnormalities of gait and mobility (R26.89);Muscle weakness (generalized) (M62.81)                Time: 8976-8963 OT Time Calculation (min): 13 min Charges:  OT General Charges $OT Visit: 1 Visit OT Evaluation $OT Eval Low Complexity: 1 Low  Eutha Cude OT, MOT  Jayson Person 07/07/2024, 2:54 PM

## 2024-07-07 NOTE — Evaluation (Signed)
 Clinical/Bedside Swallow Evaluation Patient Details  Name: Jordan Ward MRN: [id number] Date of Birth: 06/14/1941  Today's Date: 07/07/2024 Time: SLP Start Time (ACUTE ONLY): 1435 SLP Stop Time (ACUTE ONLY): 1503 SLP Time Calculation (min) (ACUTE ONLY): 28 min  Past Medical History:  Past Medical History:  Diagnosis Date   Anemia    Arthritis    Back pain, chronic    Bilateral carotid bruits    C. difficile colitis 09/2011   CAD (coronary artery disease)    Carotid artery stenosis    Cerebrovascular disease    Cerebrovascular disease 08/17/2018   Colon polyps    Complication of cystostomy catheter, initial encounter 04/20/2020   Dyslipidemia    Dysphagia 10/07/2011   Encephalopathy    Gait disorder    HA (headache)    High grade dysplasia in colonic adenoma 09/2005   History of kidney stones    HTN (hypertension), malignant 10/06/2011   Hypernatremia    Hypokalemia    Hypothyroidism 10/08/2011   Insomnia    Junctional rhythm    Kidney stones    MS (multiple sclerosis)    Nausea and vomiting 03/02/2020   Neuromuscular disorder (HCC)    MS   OSA (obstructive sleep apnea)    Paroxysmal atrial tachycardia    Peripheral vascular disease    Pneumonia 4 yrs ago   Pulmonary fibrosis (HCC) 10/06/2011   Pulmonary nodule 10/08/2011   PVD (peripheral vascular disease)    Sacral ulcer (HCC)    Sleep apnea    cannot tolerate   Stroke (HCC)    left sided weakness   Suprapubic catheter (HCC)    TIA (transient ischemic attack)    Tremors of nervous system 10/08/2011   Urinary tract infection    Ventral hernia without obstruction or gangrene     Large 8X9cm ventral hernia with loss of domain. CT reads report as diastasis recti with herniation or diastasis recti.  Dr. Kallie, Surgery, reviewed CT with radiology and there is herniation with only hernia sac or peritoneum over the bowel and large separation of the rectus muslce (i.e. diastasis recti aka loss of domain).  No  surgical intervention recommended given size, age, and health.    Past Surgical History:  Past Surgical History:  Procedure Laterality Date   APPENDECTOMY  09/2005   at time of left hemicolectomy   BACK SURGERY  1976/1979   lower   BILIARY DILATION N/A 03/03/2020   Procedure: BILIARY DILATION;  Surgeon: Golda Claudis PENNER, MD;  [facility];  Service: Endoscopy;  Laterality: N/A;   CATARACT EXTRACTION W/PHACO Right 03/08/2018   Procedure: CATARACT EXTRACTION PHACO AND INTRAOCULAR LENS PLACEMENT RIGHT EYE;  Surgeon: Perley Hamilton, MD;  Location: [facility];  Service: Ophthalmology;  Laterality: Right;  CDE: 8.86   CATARACT EXTRACTION W/PHACO Left 04/05/2018   Procedure: CATARACT EXTRACTION PHACO AND INTRAOCULAR LENS PLACEMENT (IOC);  Surgeon: Perley Hamilton, MD;  Location: [facility];  Service: Ophthalmology;  Laterality: Left;  CDE: 7.36   CENTRAL LINE INSERTION-TUNNELED Right 09/11/2020   Procedure: PLACEMENT OF TUNNELED CENTRAL LINE INTO JUGULAR VEIN;  Surgeon: Kallie Manuelita BROCKS, MD;  Location: [facility];  Service: General;  Laterality: Right;   CHOLECYSTECTOMY     Dr. Claudene   COLON SURGERY  09/2005   Fleishman: four tubular adenomas, large adenomatous polyp with HIGH GRADE dysplasia   COLONOSCOPY  11/2004   Dr. Lendon sessile polyp splenic flexure, 10mm sessile polyp desc colon, tubulovillous adenoma (bx not removed)  COLONOSCOPY  01/2005   poor prep, polyp could not be found   COLONOSCOPY  05/2005   with EMR, polypectomy Dr. Norleen Dec, bx showed high grade dysplasia, partially resected   COLONOSCOPY  09/2005   Dr. Dec, [place] ink tattooing, four villous colon polyp (3 had been missed on previous colonoscopies due to limitations of procedures   COLONOSCOPY  09/2006   normal TI, no polyps   COLONOSCOPY  10/2007   Dr. Randol distal mammillations, benign bx, normal TI, random bx neg for microscopic colitis   CYSTOSCOPY W/ URETERAL STENT PLACEMENT Left 09/05/2020   Procedure:  CYSTOSCOPY WITH RETROGRADE PYELOGRAM/URETERAL STENT PLACEMENT;  Surgeon: Cam Morene ORN, MD;  Location: [facility];  Service: Urology;  Laterality: Left;   CYSTOSCOPY WITH LITHOLAPAXY N/A 07/27/2018   Procedure: CYSTOSCOPY WITH LITHOLAPAXY VIA  SUPRAPUBIC TUBE;  Surgeon: Matilda Senior, MD;  Location: [facility];  Service: Urology;  Laterality: N/A;   CYSTOSCOPY WITH RETROGRADE PYELOGRAM, URETEROSCOPY AND STENT PLACEMENT Left 06/09/2017   Procedure: CYSTOSCOPY WITH LEFT RETROGRADE PYELOGRAM, LEFT URETEROSCOPY, LEFT URETEROSCOPIC STONE EXTRACTION, LEFT URETERAL STENT PLACEMENT;  Surgeon: Matilda Senior, MD;  Location: [facility];  Service: Urology;  Laterality: Left;   CYSTOSCOPY/URETEROSCOPY/HOLMIUM LASER/STENT PLACEMENT Left 05/22/2020   Procedure: CYSTOSCOPY/URETEROSCOPY/STENT PLACEMENT;  Surgeon: Matilda Senior, MD;  [facility]: [facility];  Service: Urology;  Laterality: Left;   ERCP N/A 03/03/2020   Procedure: ENDOSCOPIC RETROGRADE CHOLANGIOPANCREATOGRAPHY (ERCP);  Surgeon: Golda Claudis PENNER, MD;  [facility];  Service: Endoscopy;  Laterality: N/A;   ERCP N/A 04/20/2020   Procedure: ENDOSCOPIC RETROGRADE CHOLANGIOPANCREATOGRAPHY (ERCP);  Surgeon: Golda Claudis PENNER, MD;  [facility];  Service: Endoscopy;  Laterality: N/A;  to be done at 7:30am in OR   GASTROINTESTINAL STENT REMOVAL N/A 04/20/2020   Procedure: STENT REMOVAL;  Surgeon: Golda Claudis PENNER, MD;  [facility];  Service: Endoscopy;  Laterality: N/A;   INGUINAL HERNIA REPAIR  1971   bilateral   INSERTION OF SUPRAPUBIC CATHETER  06/09/2017   Procedure: EXCHANGE OF SUPRAPUBIC CATHETER;  Surgeon: Matilda Senior, MD;  [facility]: [facility];  Service: Urology;;   INSERTION OF SUPRAPUBIC CATHETER  05/22/2020   Procedure: SUPRAPUBIC CATHETER EXCHANGE;  Surgeon: Matilda Senior, MD;  [facility]: [facility];  Service: Urology;;   IR NEPHROSTOMY EXCHANGE LEFT  03/25/2021   IR NEPHROSTOMY EXCHANGE LEFT  05/20/2021   IR  NEPHROSTOMY EXCHANGE LEFT  07/13/2021   IR NEPHROSTOMY EXCHANGE LEFT  09/09/2021   IR NEPHROSTOMY EXCHANGE LEFT  11/04/2021   IR NEPHROSTOMY EXCHANGE LEFT  12/23/2021   IR NEPHROSTOMY EXCHANGE LEFT  02/17/2022   IR NEPHROSTOMY EXCHANGE LEFT  04/14/2022   IR NEPHROSTOMY EXCHANGE LEFT  06/09/2022   IR NEPHROSTOMY EXCHANGE LEFT  07/28/2022   IR NEPHROSTOMY EXCHANGE LEFT  09/09/2022   IR NEPHROSTOMY EXCHANGE LEFT  11/04/2022   IR NEPHROSTOMY EXCHANGE LEFT  12/30/2022   IR NEPHROSTOMY EXCHANGE LEFT  02/25/2023   IR NEPHROSTOMY EXCHANGE LEFT  03/31/2023   IR NEPHROSTOMY EXCHANGE LEFT  05/26/2023   IR NEPHROSTOMY EXCHANGE LEFT  07/21/2023   IR NEPHROSTOMY EXCHANGE LEFT  09/15/2023   IR NEPHROSTOMY EXCHANGE LEFT  12/22/2023   IR NEPHROSTOMY EXCHANGE LEFT  03/25/2024   IR NEPHROSTOMY EXCHANGE LEFT  04/28/2024   IR NEPHROSTOMY EXCHANGE LEFT  06/02/2024   IR NEPHROSTOMY PLACEMENT LEFT  05/26/2017   IR NEPHROSTOMY PLACEMENT LEFT  05/02/2020   IR NEPHROSTOMY PLACEMENT LEFT  02/15/2021   IR NEPHROSTOMY PLACEMENT LEFT  02/22/2024  IR NEPHROSTOMY TUBE CHANGE  10/29/2023   IR URETERAL STENT PERC REMOVAL MOD SED  03/25/2021   KIDNEY STONE SURGERY  09/13/2015   LITHOTRIPSY N/A 03/03/2020   Procedure: MECHANICAL LITHOTRIPSY WITH REMOVAL OF MULTIPLE STONE FRAGMENTS;  Surgeon: Golda Claudis PENNER, MD;  [facility];  Service: Endoscopy;  Laterality: N/A;   NEPHROLITHOTOMY Left 09/13/2015   Procedure: LEFT PERCUTANEOUS NEPHROLITHOTOMY ;  Surgeon: Garnette Shack, MD;  Location: [facility];  Service: Urology;  Laterality: Left;   NEPHROSTOMY TUBE REMOVAL  05/22/2020   Procedure: NEPHROSTOMY TUBE REMOVAL;  Surgeon: Shack Garnette, MD;  Location: [facility];  Service: Urology;;   [facility] REMOVAL Right 09/20/2020   Procedure: MINOR REMOVAL CENTRAL LINE;  Surgeon: Kallie Manuelita BROCKS, MD;  Location: [facility];  Service: General;  Laterality: Right;  Pt to arrive at 7:30am for procedure   REMOVAL OF STONES N/A 04/20/2020    Procedure: REMOVAL OF STONES;  Surgeon: Golda Claudis PENNER, MD;  [facility];  Service: Endoscopy;  Laterality: N/A;   SPHINCTEROTOMY  03/03/2020   Procedure: BILLARY SPHINCTEROTOMY;  Surgeon: Golda Claudis PENNER, MD;  [facility];  Service: Endoscopy;;   SUPRAPUBIC CATHETER INSERTION     HPI:  Hagen Bohorquez  is a 83 y.o. male,with medical history significant of pulmonary fibrosis/COPD, CAD, prior stroke, multiple sclerosis with neurogenic bladder, has had a longstanding suprapubic tube with atrophic right kidney and a left percutaneous nephrostomy tube, hypothyroidism, pressure ulcers, pneumonia, long-term resident at [facility].  - Patient sent from his facility due to complaints of shortness of breath, cough, for last 3 days, he is on room air at baseline, he is with progressive dyspnea started to require oxygen , no chest pain, no fever, no chills, no nausea, no vomiting, he does report cough intermittently with eating, he has chronic suprapubic catheter and left nephrostomy tube.  - Patient initially on 8 L oxygen  by EMS, but he is currently on 4 L oxygen , ABG significant for low PaO2, chest x-ray significant for left lung opacity, labs significant for low potassium at 3.1, creatinine at 2.9, up from baseline of 1.94, he has significant leukocytosis at 23K, lactic acid elevated at 2.8, troponins elevated at 236, and his proBNP elevated at 21 393, patient started on IV fluids, antibiotics and [facility] hospitalist consulted to admit (per MD). BSE requested. Pt known to SLP service for dysphagia and h/o Zenker's diverticulum with most recent MBSS 02/2022.   MBSS 02/2022: <<Pt presents with moderate oropharyngeal phase dysphagia and primary esophageal dysphagia characterized by impaired lingual movement with reduced bolus cohesion, premature spillage, delay in swallow trigger at the level of the pyriforms with thins (valleculae with HTL and NTL), penetration of thins during the swallow and  aspiration of thins (tsp/cup) after the swallow from residuals on the underside of the epiglottis and between the arytenoids running down between the vocal folds. Aspiration occurred in trace and moderate amounts and were generally sensed after a delay with a strong cough, but not removed. Pt also noted to have a Zenker's diverticulum which filled with barium and then backflowed into the pharynx (trace amounts aspirated after the swallow). Pt trialed a modified supraglottic and also a chin tuck, however found to be ineffective. Pt with reduced tongue base retraction with moderate vallecular residue after swallowing regular textures. Pt encouraged to clear throat and repeat/dry swallow frequently throughout the study which did result and some removal from the laryngeal vestibule. He has difficulty initiating a secondary/volitional swallow, which  was also evident in 2016 study. Pt with seemingly improved pharyngeal clearance with honey-thick liquids (no penetration), however this also filled Zenker's and backflowed into the pyriforms.    Pt has a long standing history of dysphagia and has likely been aspirating liquids for years. He was deconditioned after COVID and also has a Zenker's diverticulum. He reports that he primarily consumes soft solids at home and water . Pt with similar performance on MBSS 10/25/2014, however noted increased size of Zenker's and now reflux into pharynx noted. Given long standing history of dysphagia, Pt is at risk for aspiration. He and his wife indicate no recent bouts of PNA.    Recommend continuing D3/mech soft with chopped meats and ok for thin liquids via small cup sip (swallow, cough, repeat/dry swallow) or consider honey-thick liquids during meals and free water  [place] Water  protocol) between meals and after oral care. Pt needs to sit upright for all eating/drinking and remain up for at least 30 minutes after. Continue with po medications whole or crushed as able in puree and  follow with liquid wash. Pt should avoid mixed consistencies (cold cereal with milk, broth soup with solids). Consider GI consult due to Zenker's diverticulum. Pt has previously expressed his desire to continue with thin liquids and goal will be to try to continue without thickening liquids (no great difference between performance with thins/nectars), however it may need to be considered if Pt acquires pneumonia. While, he did clear the honey-thick liquids well, they also were refluxed from the diverticulum back into the pharynx, increasing risk for aspiration of thickened liquids. Recommend either outpatient or home health short term SLP services for dysphagia for follow up of the above recommendations. This study was reviewed with the Pt and his wife. His wife expressed interest in receiving home health SLP services. Pt will need to be monitored for shortness of breath, fever, and chest congestion. >>   Assessment / Plan / Recommendation  Clinical Impression  Clinical swallow evaluation completed with Pt seated in reclining chair. Pt is known to this SLP from previous dysphagia intervention and last MBSS in 02/2022. Pt has been placed on and off thickened liquids since 2016 and he has made it clear that he does not want to be on thickened liquids and usually drinks thin once discharged. His last imaging in 2023 showed Zenker's diverticulum which filled with all consistencies and then had some degree of backflow into the pharynx which can be aspirated. There was no significant difference in thickened and thin liquids when this occurred. Pt was at a greater risk of aspiration of thin liquids during the swallow, but once he was given thickened liquids- he was at risk for aspirating those AFTER the swallow due to backflow. Pt assessed with ice chips, thin water , puree, and regular textures this date. He had one episode of delayed coughing after straw sips thin water . Recommend D3/mech soft and thin liquids via  small sips when Pt is alert and upright, multiple swallows to clear and he should periodically cough/clear his throat and repeat swallow, PO medication crush large pills as able in puree. Pt has not pursued having his Zenker's diverticulum repaired. He could consider this to decrease risks of aspiration. No further SLP services indicated at this time.    SLP Visit Diagnosis: Dysphagia, pharyngoesophageal phase (R13.14)    Aspiration Risk  Moderate aspiration risk    Diet Recommendation Dysphagia 3 (Mech soft);Thin liquid    Liquid Administration via: Cup;Straw Medication Administration: Crushed with puree Supervision:  Patient able to self feed;Intermittent supervision to cue for compensatory strategies Compensations: Slow rate;Small sips/bites;Multiple dry swallows after each bite/sip;Clear throat intermittently;Hard cough after swallow Postural Changes: Seated upright at 90 degrees;Remain upright for at least 30 minutes after po intake    Other  Recommendations Oral Care Recommendations: Oral care BID;Staff/trained caregiver to provide oral care     Assistance Recommended at Discharge    Functional Status Assessment Patient has not had a recent decline in their functional status  Frequency and Duration            Prognosis Prognosis for improved oropharyngeal function: Fair Barriers to Reach Goals: Other (Comment) (Zenker's, previous dysphagia)      Swallow Study   General Date of Onset: 07/06/24 HPI: Faye Sanfilippo  is a 83 y.o. male,with medical history significant of pulmonary fibrosis/COPD, CAD, prior stroke, multiple sclerosis with neurogenic bladder, has had a longstanding suprapubic tube with atrophic right kidney and a left percutaneous nephrostomy tube, hypothyroidism, pressure ulcers, pneumonia, long-term resident at [facility].  - Patient sent from his facility due to complaints of shortness of breath, cough, for last 3 days, he is on room air at baseline, he is with  progressive dyspnea started to require oxygen , no chest pain, no fever, no chills, no nausea, no vomiting, he does report cough intermittently with eating, he has chronic suprapubic catheter and left nephrostomy tube.  - Patient initially on 8 L oxygen  by EMS, but he is currently on 4 L oxygen , ABG significant for low PaO2, chest x-ray significant for left lung opacity, labs significant for low potassium at 3.1, creatinine at 2.9, up from baseline of 1.94, he has significant leukocytosis at 23K, lactic acid elevated at 2.8, troponins elevated at 236, and his proBNP elevated at 21 393, patient started on IV fluids, antibiotics and [facility] hospitalist consulted to admit (per MD). BSE requested. Pt known to SLP service for dysphagia and h/o Zenker's diverticulum with most recent MBSS 02/2022. Type of Study: Bedside Swallow Evaluation Previous Swallow Assessment: MBS 02/2022 Diet Prior to this Study: Dysphagia 2 (finely chopped);Thin liquids (Level 0) Temperature Spikes Noted: No Respiratory Status: Nasal cannula History of Recent Intubation: No Behavior/Cognition: Alert;Cooperative;Pleasant mood Oral Cavity Assessment: Within Functional Limits;Dry Oral Care Completed by SLP: Yes Oral Cavity - Dentition: Poor condition Vision: Functional for self-feeding Self-Feeding Abilities: Able to feed self Patient Positioning: Upright in chair Baseline Vocal Quality: Normal Volitional Cough: Strong Volitional Swallow: Able to elicit    Oral/Motor/Sensory Function Overall Oral Motor/Sensory Function: Within functional limits   Ice Chips Ice chips: Within functional limits Presentation: Spoon   Thin Liquid Thin Liquid: Impaired Presentation: Straw;Self Fed Pharyngeal  Phase Impairments: Cough - Delayed    Nectar Thick Nectar Thick Liquid: Not tested   Honey Thick Honey Thick Liquid: Not tested   Puree Puree: Within functional limits Presentation: Self Fed   Solid     Solid: Within functional  limits Presentation: Self Fed     Thank you,  Lamar Candy, CCC-SLP [phone]  Josaphine Shimamoto 07/07/2024,3:18 PM

## 2024-07-07 NOTE — TOC Initial Note (Signed)
 Transition of Care [facility]) - Initial/Assessment Note    Patient Details  Name: Jordan Ward MRN: [id number] Date of Birth: 1941/08/03  Transition of Care [facility]) CM/SW Contact:    Sharlyne Stabs, RN Phone Number: 07/07/2024, 3:29 PM  Clinical Narrative:        Patient admitted with Acute hypoxemic respiratory failure. Patient went to [facility] in May. He is Medicare and Medicaid. Waiting to hear back from Debbie to see if he needs a PT eval. Patient will need 1-2 more days. IPCM following.       Expected Discharge Plan: Skilled Nursing Facility Barriers to Discharge: Continued Medical Work up   Patient Goals and CMS Choice Patient states their goals for this hospitalization and ongoing recovery are:: return to [facility]          Expected Discharge Plan and Services   Prior Living Arrangements/Services   Lives with:: Facility Resident Patient language and need for interpreter reviewed:: Yes        Need for Family Participation in Patient Care: Yes (Comment) Care giver support system in place?: Yes (comment) Current home services: DME Criminal Activity/Legal Involvement Pertinent to Current Situation/Hospitalization: No - Comment as needed  Activities of Daily Living   ADL Screening (condition at time of admission) Independently performs ADLs?: No Does the patient have a NEW difficulty with bathing/dressing/toileting/self-feeding that is expected to last >3 days?: No Does the patient have a NEW difficulty with getting in/out of bed, walking, or climbing stairs that is expected to last >3 days?: No Does the patient have a NEW difficulty with communication that is expected to last >3 days?: No Is the patient deaf or have difficulty hearing?: No Does the patient have difficulty seeing, even when wearing glasses/contacts?: No Does the patient have difficulty concentrating, remembering, or making decisions?: No  Permission Sought/Granted      Emotional Assessment      Affect (typically observed): Unable to Assess Orientation: : Oriented to Self Alcohol  / Substance Use: Not Applicable Psych Involvement: No (comment)  Admission diagnosis:  Acute respiratory failure with hypoxia (HCC) [J96.01] Acute hypoxemic respiratory failure (HCC) [J96.01] Pneumonia of right lower lobe due to infectious organism [J18.9] Sepsis, due to unspecified organism, unspecified whether acute organ dysfunction present [facility]) [A41.9] Patient Active Problem List   Diagnosis Date Noted   Acute respiratory failure with hypoxia (HCC) 07/06/2024   Acute hypoxemic respiratory failure (HCC) 07/06/2024   AKI (acute kidney injury) 01/30/2024   Dyslipidemia 10/29/2023   Anxiety 10/29/2023   Flank pain 10/29/2023   Displacement of nephrostomy tube 10/28/2023   COVID-19 09/30/2023   CKD (chronic kidney disease) stage 4, GFR 15-29 ml/min (HCC) 09/29/2023   Nephrostomy present (HCC) 09/29/2023   Emphysema lung (HCC) 09/29/2023   Right renal atrophy 07/30/2023   Hypokalemia 05/19/2022   Pressure ulcer, heel 07/14/2021   Rigors 03/07/2021   Encounter for removal of vascular catheter 09/14/2020   Pneumonia due to COVID-19 virus 07/12/2020   Bacteremia due to Gram-negative bacteria 05/04/2020   Hydronephrosis 05/02/2020   Dehydration 03/02/2020   Nausea and vomiting 03/02/2020   Ventral hernia without obstruction or gangrene 03/01/2020   Hypotension 03/01/2020   Poor appetite 03/01/2020   Detrusor areflexia 09/16/2019   Cerebrovascular disease 08/17/2018   Partial small bowel obstruction (HCC) 07/11/2017   Choledocholithiasis 07/11/2017   Paroxysmal atrial tachycardia 06/14/2017   Hypophosphatemia 06/14/2017   Pressure injury of skin 05/26/2017   Hydronephrosis due to obstruction of ureter 05/26/2017   Obstructive uropathy  05/25/2017   CKD (chronic kidney disease), stage III (HCC) 08/23/2016   Hydronephrosis of left kidney 08/23/2016   Kidney stones 09/13/2015   Essential  hypertension    Pressure ulcer 08/04/2015   Chronic diastolic (congestive) heart failure (HCC) 08/04/2015   Hydronephrosis with obstructing calculus 08/04/2015   Occult blood positive stool 09/05/2014   Edema 08/19/2014   Dilated cardiomyopathy (HCC) 07/21/2014   Headache 03/13/2014   Cerebral infarction (HCC) 03/13/2014   Abnormality of gait 03/13/2014   Tremors of nervous system 10/08/2011   Hypothyroidism 10/08/2011   Pulmonary nodule 10/08/2011   Dysphagia 10/07/2011   HTN (hypertension), malignant 10/06/2011   Chronic suprapubic catheter (HCC) 10/06/2011   Pulmonary fibrosis (HCC) 10/06/2011   UTI (urinary tract infection) 10/03/2011   Junctional rhythm 10/02/2011   Multiple sclerosis 10/02/2011   Chronic diarrhea 06/17/2011   Hx of adenomatous colonic polyps 06/17/2011   High grade dysplasia in colonic adenoma 09/29/2005   PCP:  Shona Norleen PEDLAR, MD Pharmacy:   [facility] - [place], [place] - [address] [place] [address] Phone: [phone] Fax: [phone]  [facility] - [place], [place] - [address] [place] [address] Phone: [phone] Fax: [phone]     Social Drivers of Health (SDOH) Social History: SDOH Screenings   Food Insecurity: No Food Insecurity (07/06/2024)  Housing: Low Risk  (07/06/2024)  Transportation Needs: No Transportation Needs (07/06/2024)  Utilities: Not At Risk (07/06/2024)  Depression (PHQ2-9): Low Risk  (09/26/2022)  Financial Resource Strain: Low Risk  (11/03/2023)   Received from [facility]  Physical Activity: Inactive (11/03/2023)   Received from [facility]  Social Connections: Moderately Isolated (07/06/2024)  Stress: No Stress Concern Present (11/03/2023)   Received from [facility]  Tobacco Use: Medium Risk (07/06/2024)  Health Literacy: High Risk (11/03/2023)   Received from [facility]   SDOH Interventions:     Readmission Risk  Interventions    07/07/2024    3:27 PM 10/12/2023   11:35 AM 08/25/2022    3:58 PM  Readmission Risk Prevention Plan  Transportation Screening Complete Complete Complete  Home Care Screening  Complete Complete  Medication Review (RN CM)  Complete Complete  Medication Review (RN Care Manager) Complete    PCP or Specialist appointment within 3-5 days of discharge Not Complete    HRI or Home Care Consult Complete    SW Recovery Care/Counseling Consult Complete    Palliative Care Screening Not Applicable    Skilled Nursing Facility Complete

## 2024-07-07 NOTE — Consult Note (Addendum)
 WOC Nurse Consult Note: Consult requested for sacrum and bilat buttocks.  Performed remotely after review of progress notes and photos in the EMR.  Pt has several chronic wounds. He is critically ill with multiple systemic factors which can impair healing. Sacrum with chronic Stage 4 pressure injury, red and moist 10X10X1cm with tunneling, according to the bedside nurses; wound care flow sheet.  Right buttocks with chronic Stage 3 pressure injury, red and narrow and moist, .8X.8X.5cm, according to the bedside nurses; wound care flow sheet.  Left buttock with chronic Stage 3 pressure injury, red/yellow and moist, .5X.5X.2cm, according to the bedside nurses; wound care flow sheet.  Pressure Injury POA: Yes, according to the bedside nurse's wound care flow sheet  Pt is on a low airloss mattress to reduce pressure. Topical treatment orders provided for bedside nurses to perform as follows to absorb drainage and provide antimicrobial benefits:  1. Pack sacrum and right buttocks with Aquacel Soila # [id number]) Q day, using swab to fill, then cover with foam dressing.  2. Apply a piece of Aquacel over left buttocks wound Q day, and cover with foam dressing.   Please re-consult if further assistance is needed.  Thank-you,  Stephane Fought MSN, RN, CWOCN, CWCN-AP, CNS Contact Mon-Fri 0700-1500: [phone]

## 2024-07-07 NOTE — Plan of Care (Signed)
  Problem: Acute Rehab OT Goals (only OT should resolve) Goal: Pt. Will Perform Grooming Flowsheets (Taken 07/07/2024 1457) Pt Will Perform Grooming:  with set-up  sitting Goal: Pt. Will Perform Upper Body Bathing Flowsheets (Taken 07/07/2024 1457) Pt Will Perform Upper Body Bathing:  with set-up  sitting Goal: Pt. Will Perform Upper Body Dressing Flowsheets (Taken 07/07/2024 1457) Pt Will Perform Upper Body Dressing:  with set-up  sitting Goal: Pt. Will Perform Lower Body Dressing Flowsheets (Taken 07/07/2024 1457) Pt Will Perform Lower Body Dressing:  with modified independence  with adaptive equipment  sitting/lateral leans Goal: Pt. Will Transfer To Toilet Flowsheets (Taken 07/07/2024 1457) Pt Will Transfer to Toilet:  stand pivot transfer  with contact guard assist Goal: Pt. Will Perform Toileting-Clothing Manipulation Flowsheets (Taken 07/07/2024 1457) Pt Will Perform Toileting - Clothing Manipulation and hygiene:  with min assist  sitting/lateral leans  sit to/from stand Goal: Pt/Caregiver Will Perform Home Exercise Program Flowsheets (Taken 07/07/2024 1457) Pt/caregiver will Perform Home Exercise Program:  Increased strength  Both right and left upper extremity  Independently  Loreley Schwall OT, MOT

## 2024-07-07 NOTE — Progress Notes (Signed)
 [facility] PROGRESS NOTE  Jordan Ward (DOB: 05/09/1941) FMW:[id number] PCP: Shona Norleen PEDLAR, MD  Brief Narrative: Jordan Ward  is a 83 y.o. male,with medical history significant of pulmonary fibrosis/COPD, CAD, prior stroke, multiple sclerosis with neurogenic bladder, has had a longstanding suprapubic tube with atrophic right kidney and a left percutaneous nephrostomy tube, hypothyroidism, pressure ulcers, pneumonia, long-term resident at [facility]. - Patient sent from his facility due to complaints of shortness of breath, cough, for last 3 days, he is on room air at baseline, he is with progressive dyspnea started to require oxygen , no chest pain, no fever, no chills, no nausea, no vomiting, he does report cough intermittently with eating, he has chronic suprapubic catheter and left nephrostomy tube. - Patient initially on 8 L oxygen  by EMS, but he is currently on 4 L oxygen , ABG significant for low PaO2, chest x-ray significant for left lung opacity, labs significant for low potassium at 3.1, creatinine at 2.9, up from baseline of 1.94, he has significant leukocytosis at 23K, lactic acid elevated at 2.8, troponins elevated at 236, and his proBNP elevated at 21 393, patient started on IV fluids, antibiotics and [facility] consulted to admit  Subjective: Already feels his breathing is better, but still requiring oxygen  newly. Start a few days prior with productive cough and hypoxia with therapy. Progressed prompting presentation to ED. Denies current dyspnea but he looks tachypneic.  Objective: BP 115/78   Pulse 68   Temp 98 F (36.7 C) (Oral)   Resp (!) 21   Ht 5' 5 (1.651 m)   Wt 50.5 kg   SpO2 93%   BMI 18.53 kg/m   Gen: Frail male in no acute distress Pulm: Coarse mostly at L > R base, tachypneic, some pursed lip breathing without accessory muscle use and pt reports zero shortness of breath  CV: RRR, no edema GI: Soft, NT, ND, +BS. Suprapubic cath c/d/I. Left flank PCN  with c/d/I dressing, draining light yellow urine.  Neuro: Alert and oriented. Conversant Ext: Warm, dry Skin: Dressings on lower legs without surrounding erythema or tenderness.  Assessment & Plan: Sepsis, POA Acute respiratory failure with hypoxemia Pneumonia: Significant for sepsis POA, elevated lactic acid, soft blood pressure and leukocytosis - Concern for pneumonia, likely aspiration as he reports cough. SLP evaluation requested.  - Follow-up blood culture, sputum culture (pending), Legionella antigen. Pneumococcal Ag neg.   - Keep on broad-spectrum antibiotic vancomycin , cefepime  and flagyl  - Follow urine analysis - Blood pressure is soft, known CHF with significantly elevated proBNP, no much room for IV fluids, will start on midodrine, and if needed can do peripheral pressors - Leukocytosis improving already, will monitor.    Lactic acidosis: Cleared, persistent NAGMA likely due to AKI  AKI CKD stage III -baseline creatinine is 2, currently 2.9 -Avoid nephrotoxic medications, continue with IV fluids -Avoid hypotension   Hypokalemia -Replaced   Prolonged  QTc -Correct hypokalemia and check magnesium    Chronic systolic/diastolic CHF Elevated troponins Evaded proBNP -Echo in January 2025 significant for low EF 30 to 35% -proBNP significantly elevated, but he appears to be euvolemic, so we will hold on diuresis and IVF. - Elevated troponin due to AKI and sepsis, he denies any chest pain   Chronic percutaneous nephrostomy tube: Recently exchanged per pt. UA has few bacteria.   COPD Pulmonary fibrosis - COPD and pulmonary fibrosis at baseline, but main concern is infectious process given elevated lactic acid and leukocytosis - Give 1 dose of IV steroids. -  Continue with scheduled DuoNebs and as needed albuterol    GERD - Continue with PPI   Hypothyroidism -continue levothyroxine    Deconditioning/multiple sclerosis - Patient reports he is wheelchair dependent   -Consult PT/OT   Pressure ulcers -Not appear to be infected, will consult wound care   Bernardino KATHEE Come, MD [facility] [email or web address] 07/07/2024, 6:25 PM

## 2024-07-08 DIAGNOSIS — J9601 Acute respiratory failure with hypoxia: Secondary | ICD-10-CM | POA: Diagnosis not present

## 2024-07-08 LAB — BASIC METABOLIC PANEL WITH GFR
Anion gap: 13 (ref 5–15)
BUN: 71 mg/dL — ABNORMAL HIGH (ref 8–23)
CO2: 16 mmol/L — ABNORMAL LOW (ref 22–32)
Calcium: 9.2 mg/dL (ref 8.9–10.3)
Chloride: 111 mmol/L (ref 98–111)
Creatinine, Ser: 2.48 mg/dL — ABNORMAL HIGH (ref 0.61–1.24)
GFR, Estimated: 25 mL/min — ABNORMAL LOW (ref >60)
Glucose, Bld: 185 mg/dL — ABNORMAL HIGH (ref 70–99)
Potassium: 3.9 mmol/L (ref 3.5–5.1)
Sodium: 139 mmol/L (ref 135–145)

## 2024-07-08 LAB — CBC
HCT: 27.8 % — ABNORMAL LOW (ref 39.0–52.0)
Hemoglobin: 8.5 g/dL — ABNORMAL LOW (ref 13.0–17.0)
MCH: 26.5 pg (ref 26.0–34.0)
MCHC: 30.6 g/dL (ref 30.0–36.0)
MCV: 86.6 fL (ref 80.0–100.0)
Platelets: 278 K/uL (ref 150–400)
RBC: 3.21 MIL/uL — ABNORMAL LOW (ref 4.22–5.81)
RDW: 15.6 % — ABNORMAL HIGH (ref 11.5–15.5)
WBC: 13.7 K/uL — ABNORMAL HIGH (ref 4.0–10.5)
nRBC: 0 % (ref 0.0–0.2)

## 2024-07-08 LAB — LEGIONELLA PNEUMOPHILA SEROGP 1 UR AG: L. pneumophila Serogp 1 Ur Ag: NEGATIVE

## 2024-07-08 MED ORDER — VANCOMYCIN HCL 750 MG/150ML IV SOLN
750.0000 mg | INTRAVENOUS | Status: DC
  Administered 2024-07-09 – 2024-07-11 (×2): 750 mg via INTRAVENOUS
  Filled 2024-07-08 (×2): qty 150

## 2024-07-08 MED ORDER — JUVEN PO PACK
1.0000 | PACK | Freq: Two times a day (BID) | ORAL | Status: DC
Start: 2024-07-08 — End: 2024-07-11
  Administered 2024-07-08 – 2024-07-11 (×6): 1 via ORAL
  Filled 2024-07-08 (×6): qty 1

## 2024-07-08 MED ORDER — ADULT MULTIVITAMIN W/MINERALS CH
1.0000 | ORAL_TABLET | Freq: Every day | ORAL | Status: DC
  Administered 2024-07-08 – 2024-07-11 (×4): 1 via ORAL
  Filled 2024-07-08 (×4): qty 1

## 2024-07-08 NOTE — Progress Notes (Signed)
 [facility] PROGRESS NOTE  [name] (DOB: Nov 24, 1940) FMW:[id number] PCP: Shona Norleen PEDLAR, MD  Brief Narrative: Jordan Ward  is a 83 y.o. male,with medical history significant of pulmonary fibrosis/COPD, CAD, prior stroke, multiple sclerosis with neurogenic bladder, has had a longstanding suprapubic tube with atrophic right kidney and a left percutaneous nephrostomy tube, hypothyroidism, pressure ulcers, pneumonia, long-term resident at [facility]. - Patient sent from his facility due to complaints of shortness of breath, cough, for last 3 days, he is on room air at baseline, he is with progressive dyspnea started to require oxygen , no chest pain, no fever, no chills, no nausea, no vomiting, he does report cough intermittently with eating, he has chronic suprapubic catheter and left nephrostomy tube. - Patient initially on 8 L oxygen  by EMS, but he is currently on 4 L oxygen , ABG significant for low PaO2, chest x-ray significant for left lung opacity, labs significant for low potassium at 3.1, creatinine at 2.9, up from baseline of 1.94, he has significant leukocytosis at 23K, lactic acid elevated at 2.8, troponins elevated at 236, and his proBNP elevated at 21 393, patient started on IV fluids, antibiotics and [facility]ist consulted to admit  Subjective: Had soft BPs and complains of pain over blood pressure cuff. It was quite high on his arm. When readjusted, BP normal, pain improved. Still with cough, more when he drinks. Stays on right side more due to nephrostomy tube on left.    Objective: BP 124/66   Pulse 73   Temp 98 F (36.7 C) (Axillary)   Resp (!) 21   Ht 5' 5 (1.651 m)   Wt 50.5 kg   SpO2 93%   BMI 18.53 kg/m   Gen: Frail thin male in no distress Pulm: Coarse but good air movement, coughing while drinking. Normal WOB.  CV: RRR, no edema GI: Soft, NT, ND, +BS. L PCN dressing and others are c/d/I, nontender.  Neuro: Alert and oriented, NO NEW focal deficits.    Assessment & Plan: Sepsis, POA Acute respiratory failure with hypoxemia Pneumonia: Significant for sepsis POA, elevated lactic acid, soft blood pressure and leukocytosis - Known dysphagia and aspiration: SLP evaluated (well known to the service) and feels the patient is at risk of aspirating thin liquids during swallow AND due to Zenker diverticulum and delayed emptying of that into pharynx, he's at risk of even aspirating thickened liquids AFTER swallowing. The patient has made it clear that he intends to continue thin liquids, so we've ordered dysphagia 3 diet with thin liquids.  - Follow-up blood culture, sputum culture (pending), Legionella antigen. Pneumococcal Ag neg.   - Keep on broad-spectrum antibiotic vancomycin , cefepime  and flagyl . WBC still up. Still requiring O2.   - Blood pressure is soft, known CHF with significantly elevated proBNP, no much room for IV fluids. Started midodrine. BP when correctly measured is no longer low, stable for transfer to floor. - Leukocytosis improving already, will monitor.    Lactic acidosis: Cleared, persistent NAGMA likely due to AKI  AKI CKD stage III -baseline creatinine is 2, currently 2.9 -Avoid nephrotoxic medications, continue with IV fluids -Avoid hypotension   Hypokalemia -Replaced   Prolonged  QTc -Correct hypokalemia and check magnesium    Chronic systolic/diastolic CHF Elevated troponins Evaded proBNP -Echo in January 2025 significant for low EF 30 to 35% -proBNP significantly elevated, but he appears to be euvolemic, so we will hold on diuresis and IVF. - Elevated troponin due to AKI and sepsis, he denies any chest  pain   Neurogenic bladder, obstructive uropathy, chronic percutaneous nephrostomy tube: UA has few bacteria.  - Follow up at [facility] 10/16 at 2:30pm for nephrostomy exchange - Follow up with [facility], Dr. Matilda 10/28.   COPD Pulmonary fibrosis - COPD and pulmonary fibrosis at baseline, but main  concern is infectious process given elevated lactic acid and leukocytosis - Given steroids, no ongoing bronchospasm is evident - Continue with scheduled DuoNebs and as needed albuterol    GERD - Continue with PPI   Hypothyroidism -continue levothyroxine    Deconditioning/multiple sclerosis - Patient reports he is wheelchair dependent  -Consult PT/OT   Pressure ulcers -Not appear to be infected, will consult wound care   Bernardino KATHEE Come, MD [facility] [email or web address] 07/08/2024, 1:37 PM

## 2024-07-08 NOTE — Plan of Care (Signed)
  Problem: Fluid Volume: Goal: Hemodynamic stability will improve Outcome: Progressing   Problem: Respiratory: Goal: Ability to maintain adequate ventilation will improve Outcome: Progressing   Problem: Health Behavior/Discharge Planning: Goal: Ability to manage health-related needs will improve Outcome: Progressing   Problem: Clinical Measurements: Goal: Ability to maintain clinical measurements within normal limits will improve Outcome: Progressing

## 2024-07-08 NOTE — Progress Notes (Signed)
 Initial Nutrition Assessment  DOCUMENTATION CODES:   Not applicable  INTERVENTION:   Continue Ensure Plus High Protein po BID, each supplement provides 350 kcal and 20 grams of protein Magic cup TID with meals, each supplement provides 290 kcal and 9 grams of protein MVI with minerals daily Juven BID, each packet provides 80 calories, 8 grams of carbohydrate, 2.5  grams of protein (collagen), 7 grams of L-arginine and 7 grams of L-glutamine; supplement contains CaHMB, Vitamins C, E, B12 and Zinc  to promote wound healing  NUTRITION DIAGNOSIS:   Increased nutrient needs related to wound healing as evidenced by estimated needs.  GOAL:   Patient will meet greater than or equal to 90% of their needs  MONITOR:   PO intake, Supplement acceptance, Skin  REASON FOR ASSESSMENT:   Malnutrition Screening Tool, Rounds    ASSESSMENT:   83 yo male admitted with sepsis r/t PNA. PMH includes pulmonary fibrosis, COPD, CAD, stroke, multiple sclerosis, neurogenic bladder, pressure ulcers, PVD, dysphagia, hypothyroidism, Zenker's diverticulum.  Patient reports having some coughing with thin liquids at times, but does not want to be on thickened liquids. S/P bedside swallow evaluation with SLP 10/9. He likes Ensure supplements and agreed to receive magic cups with meals; he has had these in the past. Will add MVI daily and try Juven to support wound healing.   Currently on dysphagia 3 diet with thin liquids. Meal intakes recorded at 33-75%. He is also drinking Ensure supplements BID.   Labs reviewed.   Medications reviewed and include protonix , miralax , klor-con , thiamine , IV antibiotics.  Weight history reviewed. No significant weight changes noted over the past year.   NUTRITION - FOCUSED PHYSICAL EXAM:  Severe muscle wasting present r/t multiple sclerosis  Diet Order:   Diet Order             DIET DYS 3 Fluid consistency: Thin  Diet effective now                   EDUCATION  NEEDS:   Not appropriate for education at this time  Skin:  Skin Assessment: Skin Integrity Issues: Skin Integrity Issues:: Stage III, Stage IV Stage III: R & L buttocks, L heel Stage IV: sacrum  Last BM:  no BM documented  Height:   Ht Readings from Last 1 Encounters:  07/06/24 5' 5 (1.651 m)    Weight:   Wt Readings from Last 1 Encounters:  07/06/24 50.5 kg    BMI:  Body mass index is 18.53 kg/m.  Estimated Nutritional Needs:   Kcal:  1600-1800  Protein:  80-90 gm  Fluid:  1.6-1.8 L   Suzen HUNT RD, LDN, CNSC Contact via secure chat. If unavailable, use group chat RD Inpatient.

## 2024-07-08 NOTE — Progress Notes (Addendum)
 Pharmacy Antibiotic Note  Jordan Ward is a 83 y.o. male admitted on 07/06/2024 with HCAP.  Pharmacy has been consulted for Vancomycin  and cefepime  dosing. AKI, with some improvement in Scr. Resp fx with PNA and h/o pulmonary fibrosis/COPD.   Plan: Continue Vancomycin  750mg  IV Q48 hrs. Goal AUC 400-550. Expected AUC: 486 SCr used: 2.48 Continue Cefepime  2gm IV q24h F/U cxs and clinical progress Monitor V/S, labs and levels as indicated   Height: 5' 5 (165.1 cm) Weight: 50.5 kg (111 lb 5.3 oz) IBW/kg (Calculated) : 61.5  Temp (24hrs), Avg:97.5 F (36.4 C), Min:96.6 F (35.9 C), Max:98.3 F (36.8 C)  Recent Labs  Lab 07/06/24 1456 07/06/24 1805 07/07/24 0334 07/08/24 0317  WBC 23.9*  --  14.5* 13.7*  CREATININE 2.91*  --  2.54* 2.48*  LATICACIDVEN 2.8* 1.7  --   --     Estimated Creatinine Clearance: 16.7 mL/min (A) (by C-G formula based on SCr of 2.48 mg/dL (H)).    Allergies  Allergen Reactions   Ciprofloxacin  Other (See Comments)    Trouble swallowing according to wife Clemens out in the floor Pt can tolerate Levaquin    Tetracyclines & Related Anaphylaxis and Rash    Antimicrobials this admission: Vancomycin  10/8 >>  Cefepime  10/8 >>  Zosyn  x 1 in ED 10/8  Microbiology results: 10/8 BCX: ngtd 10/8 MRSA PCR: positive   Thank you for allowing pharmacy to be a part of this patient's care.  Leilah Polimeni, BS Pharm D, BCPS Clinical Pharmacist 07/08/2024 9:02 AM

## 2024-07-09 DIAGNOSIS — J9601 Acute respiratory failure with hypoxia: Secondary | ICD-10-CM | POA: Diagnosis not present

## 2024-07-09 NOTE — Plan of Care (Signed)
  Problem: Fluid Volume: Goal: Hemodynamic stability will improve Outcome: Progressing   Problem: Clinical Measurements: Goal: Diagnostic test results will improve Outcome: Progressing Goal: Signs and symptoms of infection will decrease Outcome: Progressing   Problem: Respiratory: Goal: Ability to maintain adequate ventilation will improve Outcome: Progressing   Problem: Education: Goal: Knowledge of General Education information will improve Description: Including pain rating scale, medication(s)/side effects and non-pharmacologic comfort measures Outcome: Progressing   Problem: Health Behavior/Discharge Planning: Goal: Ability to manage health-related needs will improve Outcome: Progressing   Problem: Clinical Measurements: Goal: Ability to maintain clinical measurements within normal limits will improve Outcome: Progressing Goal: Will remain free from infection Outcome: Progressing Goal: Diagnostic test results will improve Outcome: Progressing Goal: Respiratory complications will improve Outcome: Progressing Goal: Cardiovascular complication will be avoided Outcome: Progressing   Problem: Activity: Goal: Risk for activity intolerance will decrease Outcome: Progressing   Problem: Nutrition: Goal: Adequate nutrition will be maintained Outcome: Progressing   Problem: Coping: Goal: Level of anxiety will decrease Outcome: Progressing   Problem: Elimination: Goal: Will not experience complications related to bowel motility Outcome: Progressing Goal: Will not experience complications related to urinary retention Outcome: Progressing   Problem: Pain Managment: Goal: General experience of comfort will improve and/or be controlled Outcome: Progressing   Problem: Safety: Goal: Ability to remain free from injury will improve Outcome: Progressing   Problem: Skin Integrity: Goal: Risk for impaired skin integrity will decrease Outcome: Progressing   Problem:  Activity: Goal: Ability to tolerate increased activity will improve Outcome: Progressing   Problem: Clinical Measurements: Goal: Ability to maintain a body temperature in the normal range will improve Outcome: Progressing   Problem: Respiratory: Goal: Ability to maintain adequate ventilation will improve Outcome: Progressing Goal: Ability to maintain a clear airway will improve Outcome: Progressing

## 2024-07-09 NOTE — Progress Notes (Signed)
 [facility] PROGRESS NOTE  Jordan Ward (DOB: 09/30/1940) FMW:[id number] PCP: Shona Norleen PEDLAR, MD  Brief Narrative: Jordan Ward  is a 83 y.o. male,with medical history significant of pulmonary fibrosis/COPD, CAD, prior stroke, multiple sclerosis with neurogenic bladder, has had a longstanding suprapubic tube with atrophic right kidney and a left percutaneous nephrostomy tube, hypothyroidism, pressure ulcers, pneumonia, long-term resident at [facility]. - Patient sent from his facility due to complaints of shortness of breath, cough, for last 3 days, he is on room air at baseline, he is with progressive dyspnea started to require oxygen , no chest pain, no fever, no chills, no nausea, no vomiting, he does report cough intermittently with eating, he has chronic suprapubic catheter and left nephrostomy tube. - Patient initially on 8 L oxygen  by EMS, but he is currently on 4 L oxygen , ABG significant for low PaO2, chest x-ray significant for left lung opacity, labs significant for low potassium at 3.1, creatinine at 2.9, up from baseline of 1.94, he has significant leukocytosis at 23K, lactic acid elevated at 2.8, troponins elevated at 236, and his proBNP elevated at 21 393, patient started on IV fluids, antibiotics and [facility] consulted to admit  Subjective: Had soft BPs and complains of pain over blood pressure cuff. It was quite high on his arm. When readjusted, BP normal, pain improved. Still with cough, more when he drinks. Stays on right side more due to nephrostomy tube on left.    Objective: BP 102/68 (BP Location: Left Arm)   Pulse 73   Temp 97.9 F (36.6 C) (Oral)   Resp 18   Ht 5' 5 (1.651 m)   Wt 50.5 kg   SpO2 93%   BMI 18.53 kg/m   Gen: No distress Pulm: No change to coarse breath sounds, tachypneic with occasionally labored breathing CV: RRR, no edema GI: Soft, NT, ND, +BS  Neuro: Alert and oriented. Without any new focal deficits. Ext: Warm, no deformities.  Decreased muscle bulk Skin: No new rashes, lesions or ulcers on visualized skin   Assessment & Plan: Sepsis, POA Acute respiratory failure with hypoxemia Pneumonia: Significant for sepsis POA, elevated lactic acid, soft blood pressure and leukocytosis - Known dysphagia and aspiration: SLP evaluated (well known to the service) and feels the patient is at risk of aspirating thin liquids during swallow AND due to Zenker diverticulum and delayed emptying of that into pharynx, he's at risk of even aspirating thickened liquids AFTER swallowing. The patient has made it clear that he intends to continue thin liquids, so we've ordered dysphagia 3 diet with thin liquids. If aspiration is ongoing, we've broached the idea of G tube which pt declines even if it did come to that or respiratory failure. - Follow-up blood cultures (NG3D), sputum culture (pending), Legionella antigen. Pneumococcal Ag neg.   - Keep on broad-spectrum antibiotic vancomycin  (SNF resident, +MRSA PCR screen), cefepime  and flagyl .  - Leukocytosis improving, if persistent would additionally suggest chronic aspiration.    Lactic acidosis: Cleared, persistent NAGMA likely due to AKI  AKI CKD stage III -baseline creatinine is 2, currently 2.9 -Avoid nephrotoxic medications, continue with IV fluids -Avoid hypotension   Hypokalemia -Replaced   Prolonged  QTc -Correct hypokalemia and check magnesium    Chronic systolic/diastolic CHF Elevated troponins Evaded proBNP -Echo in January 2025 significant for low EF 30 to 35% -proBNP significantly elevated, but he appears to be euvolemic, so we will hold on diuresis and IVF. - Elevated troponin due to AKI and sepsis, he  denies any chest pain   Neurogenic bladder, obstructive uropathy, chronic percutaneous nephrostomy tube: UA has few bacteria.  - Follow up at [facility] [place] IR 10/16 at 2:30pm for nephrostomy exchange - Follow up with [facility], Dr. Matilda 10/28.   COPD Pulmonary  fibrosis - COPD and pulmonary fibrosis at baseline, but main concern is infectious process given elevated lactic acid and leukocytosis - Given steroids, no ongoing bronchospasm is evident - Continue with scheduled DuoNebs and as needed albuterol    GERD - Continue with PPI   Hypothyroidism -continue levothyroxine    Deconditioning/multiple sclerosis - Patient reports he is wheelchair dependent  -Consult PT/OT   Pressure ulcers -Not appear to be infected, will consult wound care   Bernardino KATHEE Come, MD [facility] [email or web address] 07/09/2024, 4:34 PM

## 2024-07-09 NOTE — Progress Notes (Signed)
 Patient complained of Generalized pain overnight. Was prn tylenol  and Norco. Patient developed a hacking wet cough overnight with the production of white to brownish sputum. Patient was given 1 dose of albuterol  nebu that seemed to provide some relief.

## 2024-07-10 DIAGNOSIS — J9601 Acute respiratory failure with hypoxia: Secondary | ICD-10-CM | POA: Diagnosis not present

## 2024-07-10 LAB — CBC
HCT: 31.6 % — ABNORMAL LOW (ref 39.0–52.0)
Hemoglobin: 9.4 g/dL — ABNORMAL LOW (ref 13.0–17.0)
MCH: 26.1 pg (ref 26.0–34.0)
MCHC: 29.7 g/dL — ABNORMAL LOW (ref 30.0–36.0)
MCV: 87.8 fL (ref 80.0–100.0)
Platelets: 351 K/uL (ref 150–400)
RBC: 3.6 MIL/uL — ABNORMAL LOW (ref 4.22–5.81)
RDW: 15.9 % — ABNORMAL HIGH (ref 11.5–15.5)
WBC: 17.7 K/uL — ABNORMAL HIGH (ref 4.0–10.5)
nRBC: 0.2 % (ref 0.0–0.2)

## 2024-07-10 LAB — BASIC METABOLIC PANEL WITH GFR
Anion gap: 13 (ref 5–15)
BUN: 89 mg/dL — ABNORMAL HIGH (ref 8–23)
CO2: 18 mmol/L — ABNORMAL LOW (ref 22–32)
Calcium: 9.3 mg/dL (ref 8.9–10.3)
Chloride: 112 mmol/L — ABNORMAL HIGH (ref 98–111)
Creatinine, Ser: 2.35 mg/dL — ABNORMAL HIGH (ref 0.61–1.24)
GFR, Estimated: 27 mL/min — ABNORMAL LOW (ref >60)
Glucose, Bld: 127 mg/dL — ABNORMAL HIGH (ref 70–99)
Potassium: 4.6 mmol/L (ref 3.5–5.1)
Sodium: 143 mmol/L (ref 135–145)

## 2024-07-10 NOTE — Plan of Care (Signed)
  Problem: Fluid Volume: Goal: Hemodynamic stability will improve Outcome: Progressing   Problem: Clinical Measurements: Goal: Diagnostic test results will improve Outcome: Progressing Goal: Signs and symptoms of infection will decrease Outcome: Progressing   Problem: Respiratory: Goal: Ability to maintain adequate ventilation will improve Outcome: Progressing   Problem: Education: Goal: Knowledge of General Education information will improve Description: Including pain rating scale, medication(s)/side effects and non-pharmacologic comfort measures Outcome: Progressing   Problem: Health Behavior/Discharge Planning: Goal: Ability to manage health-related needs will improve Outcome: Progressing   Problem: Clinical Measurements: Goal: Ability to maintain clinical measurements within normal limits will improve Outcome: Progressing Goal: Will remain free from infection Outcome: Progressing Goal: Diagnostic test results will improve Outcome: Progressing Goal: Respiratory complications will improve Outcome: Progressing Goal: Cardiovascular complication will be avoided Outcome: Progressing   Problem: Activity: Goal: Risk for activity intolerance will decrease Outcome: Progressing   Problem: Nutrition: Goal: Adequate nutrition will be maintained Outcome: Progressing   Problem: Coping: Goal: Level of anxiety will decrease Outcome: Progressing   Problem: Elimination: Goal: Will not experience complications related to bowel motility Outcome: Progressing Goal: Will not experience complications related to urinary retention Outcome: Progressing   Problem: Pain Managment: Goal: General experience of comfort will improve and/or be controlled Outcome: Progressing   Problem: Safety: Goal: Ability to remain free from injury will improve Outcome: Progressing   Problem: Skin Integrity: Goal: Risk for impaired skin integrity will decrease Outcome: Progressing   Problem:  Activity: Goal: Ability to tolerate increased activity will improve Outcome: Progressing   Problem: Clinical Measurements: Goal: Ability to maintain a body temperature in the normal range will improve Outcome: Progressing   Problem: Respiratory: Goal: Ability to maintain adequate ventilation will improve Outcome: Progressing Goal: Ability to maintain a clear airway will improve Outcome: Progressing

## 2024-07-10 NOTE — Progress Notes (Signed)
 [facility] PROGRESS NOTE  Jordan Ward (DOB: 20-Oct-1940) FMW:[id number] PCP: Jordan Norleen PEDLAR, MD  Brief Narrative: Jordan Ward  is a 83 y.o. male,with medical history significant of pulmonary fibrosis/COPD, CAD, prior stroke, multiple sclerosis with neurogenic bladder, has had a longstanding suprapubic tube with atrophic right kidney and a left percutaneous nephrostomy tube, hypothyroidism, pressure ulcers, pneumonia, long-term resident at [facility]. - Patient sent from his facility due to complaints of shortness of breath, cough, for last 3 days, he is on room air at baseline, he is with progressive dyspnea started to require oxygen , no chest pain, no fever, no chills, no nausea, no vomiting, he does report cough intermittently with eating, he has chronic suprapubic catheter and left nephrostomy tube. - Patient initially on 8 L oxygen  by EMS, but he is currently on 4 L oxygen , ABG significant for low PaO2, chest x-ray significant for left lung opacity, labs significant for low potassium at 3.1, creatinine at 2.9, up from baseline of 1.94, he has significant leukocytosis at 23K, lactic acid elevated at 2.8, troponins elevated at 236, and his proBNP elevated at 21 393, patient started on IV fluids, antibiotics and [facility] consulted to admit  Subjective: Feeling overall better, though still coughing when taking po. Says he can't eat all this food, but he's taking adequate po. No chest pains and no dyspnea at rest.    Objective: BP 138/82 (BP Location: Left Arm)   Pulse 91   Temp 97.7 F (36.5 C) (Oral)   Resp (!) 21   Ht 5' 5 (1.651 m)   Wt 50.5 kg   SpO2 91%   BMI 18.53 kg/m   Gen: Chronically ill-appearing male in no distress Pulm: At times tachypneic, but before encounter has normal rate. Effort not elevated, diminished diffusely with bibasilar L > R crackles.   CV: RRR, no edema GI: Soft, NT, ND, +BS  Neuro: Alert and oriented. No new focal deficits. Ext: Warm, no  deformities. Decreased muscle bulk. Skin: No new rashes, lesions or ulcers on visualized skin. PCN and suprapubic cath sites not reexamined this morning.   Assessment & Plan: Sepsis, POA Acute respiratory failure with hypoxemia Pneumonia: Significant for sepsis POA, elevated lactic acid, soft blood pressure and leukocytosis. Legionella and pneumococcal Ag's negative.  - Known dysphagia and aspiration: SLP evaluated (well known to the service) and feels the patient is at risk of aspirating thin liquids during swallow AND due to Zenker diverticulum and delayed emptying of that into pharynx, he's at risk of even aspirating thickened liquids AFTER swallowing. The patient has made it clear that he intends to continue thin liquids, so we've ordered dysphagia 3 diet with thin liquids. WBC up again, noted to be up since Jan 2025, strongly suggestive of ongoing silent and overt aspiration. I suggested thickening liquids though pt declines, also declines discussions about artificial nutrition.   - Aspiration precautions - Follow-up blood cultures (NG4D), sputum culture (still says sent/pending).   - Keep on broad-spectrum antibiotic vancomycin  (SNF resident, +MRSA PCR screen), cefepime  and flagyl .    Lactic acidosis: Cleared, persistent NAGMA likely due to AKI  AKI on stage IIIb CKD: Cr improved from admission from 2.9 > 2.3.  - Will continue monitoring while requiring vancomycin , but otherwise minimize nephrotoxins and avoid hypotension.    Hypokalemia: Resolved with supplementation.  - continue chronic K 20 mEq.   Prolonged  QTc interval:  - Avoid provocative agents - Optimize electrolytes.    Chronic combined HFrEF, HTN: Jan  2025 LVEF was 30 - 35%. BNP elevated at baseline, though no hypervolemia currently.  - Normotensive, so still holding norvasc . Doesn't appear to be on GDMT, presumably due to CKD and hemodynamic baseline. Will encourage cardiology follow up.    Demand myocardial ischemia:  Troponin trending downward, no chest pain. This was due to sepsis and respiratory failure related to pneumonia.  - No further inpatient evaluation is planned. Suggest outpatient cardiology follow up unless angina develops.     Neurogenic bladder, obstructive uropathy, chronic percutaneous nephrostomy tube: UA has few bacteria.  - Follow up at [facility] IR 10/16 at 2:30pm for nephrostomy exchange - Follow up with [facility], Dr. Matilda 10/28.   COPD, pulmonary fibrosis, chronic hypoxic respiratory failure:  - States he uses oxygen  as needed at baseline (MAR confirms this) - Does not appear to have ongoing bronchospasm, though irritation from aspiration is suspected. - Continue scheduled duonebs, prn albuterol  - No further steroids planned, received solumedrol 10/8-10/9   GERD - Continue with PPI   Hypothyroidism:   - Continue levothyroxine    Deconditioning/multiple sclerosis: Wheelchair dependent  - Plan to continue PT/OT at LTC facility after discharge   Stage 4 sacral pressure injury (chronic), Stage 3 right buttock pressure injury (chronic), Stage 3 left buttock pressure injury (chronic): All POA. Pt has several chronic wounds. He is critically ill with multiple systemic factors which can impair healing.  - Continue to offload as much as possible, low airloss mattress/turning.  - Optimize nutritional status - Per WOC:  - Pack sacrum and right buttocks with Aquacel Soila # [id number]) Q day, using swab to fill, then cover with foam dressing.  - Apply a piece of Aquacel over left buttocks wound Q day, and cover with foam dressing.    Jordan KATHEE Come, MD [facility] [email or web address] 07/10/2024, 11:04 AM

## 2024-07-11 ENCOUNTER — Other Ambulatory Visit (HOSPITAL_COMMUNITY)

## 2024-07-11 DIAGNOSIS — J9601 Acute respiratory failure with hypoxia: Secondary | ICD-10-CM | POA: Diagnosis not present

## 2024-07-11 LAB — CBC
HCT: 31.9 % — ABNORMAL LOW (ref 39.0–52.0)
Hemoglobin: 9.7 g/dL — ABNORMAL LOW (ref 13.0–17.0)
MCH: 26.5 pg (ref 26.0–34.0)
MCHC: 30.4 g/dL (ref 30.0–36.0)
MCV: 87.2 fL (ref 80.0–100.0)
Platelets: 338 K/uL (ref 150–400)
RBC: 3.66 MIL/uL — ABNORMAL LOW (ref 4.22–5.81)
RDW: 15.9 % — ABNORMAL HIGH (ref 11.5–15.5)
WBC: 18.2 K/uL — ABNORMAL HIGH (ref 4.0–10.5)
nRBC: 0.4 % — ABNORMAL HIGH (ref 0.0–0.2)

## 2024-07-11 LAB — BASIC METABOLIC PANEL WITH GFR
Anion gap: 11 (ref 5–15)
BUN: 75 mg/dL — ABNORMAL HIGH (ref 8–23)
CO2: 19 mmol/L — ABNORMAL LOW (ref 22–32)
Calcium: 8.8 mg/dL — ABNORMAL LOW (ref 8.9–10.3)
Chloride: 111 mmol/L (ref 98–111)
Creatinine, Ser: 1.96 mg/dL — ABNORMAL HIGH (ref 0.61–1.24)
GFR, Estimated: 34 mL/min — ABNORMAL LOW (ref >60)
Glucose, Bld: 112 mg/dL — ABNORMAL HIGH (ref 70–99)
Potassium: 4.6 mmol/L (ref 3.5–5.1)
Sodium: 141 mmol/L (ref 135–145)

## 2024-07-11 LAB — CULTURE, BLOOD (ROUTINE X 2)
Culture: NO GROWTH
Culture: NO GROWTH
Special Requests: ADEQUATE

## 2024-07-11 MED ORDER — DIAZEPAM 2 MG PO TABS: 2.0000 mg | ORAL_TABLET | Freq: Two times a day (BID) | ORAL | 0 refills | Status: DC

## 2024-07-11 MED ORDER — HYDROCODONE-ACETAMINOPHEN 5-325 MG PO TABS: 1.0000 | ORAL_TABLET | Freq: Three times a day (TID) | ORAL | 0 refills | Status: DC | PRN

## 2024-07-11 MED ORDER — AMOXICILLIN-POT CLAVULANATE 500-125 MG PO TABS: 1.0000 | ORAL_TABLET | Freq: Two times a day (BID) | ORAL | Status: AC

## 2024-07-11 NOTE — TOC Transition Note (Signed)
 Transition of Care [facility]) - Discharge Note   Patient Details  Name: Jordan Ward MRN: [id number] Date of Birth: Apr 12, 1941  [facility] CM/SW Contact:  [name], LCSWA Phone Number: 07/11/2024, 10:58 AM   Clinical Narrative:    CSW updated that pt is medically stable for D/C back to [facility] today. CSW spoke with Marval in admissions who confirms they are ready for pt to return. D/C clinicals sent to facility via HUB. RN provided with room and report numbers. CSW attempted to reach pts spouse to provide update on plan for D/C, no answer and VM box full. EMS called for transport. TOC signing off.   Final next level of care: Skilled Nursing Facility Barriers to Discharge: Barriers Resolved   Patient Goals and CMS Choice Patient states their goals for this hospitalization and ongoing recovery are:: return to [facility] CMS Medicare.gov Compare Post Acute Care list provided to:: Patient Choice offered to / list presented to : Patient [place] ownership interest in [facility].provided to:: Patient    Discharge Placement                Patient to be transferred to facility by: EMS Name of family member notified: wife Patient and family notified of of transfer: 07/11/24  Discharge Plan and Services Additional resources added to the After Visit Summary for                                       Social Drivers of Health (SDOH) Interventions SDOH Screenings   Food Insecurity: No Food Insecurity (07/06/2024)  Housing: Low Risk  (07/06/2024)  Transportation Needs: No Transportation Needs (07/06/2024)  Utilities: Not At Risk (07/06/2024)  Depression (PHQ2-9): Low Risk  (09/26/2022)  Financial Resource Strain: Low Risk  (11/03/2023)   Received from [facility]  Physical Activity: Inactive (11/03/2023)   Received from [facility]  Social Connections: Moderately Isolated (07/06/2024)  Stress: No Stress Concern Present (11/03/2023)   Received  from [facility]  Tobacco Use: Medium Risk (07/06/2024)  Health Literacy: High Risk (11/03/2023)   Received from [facility]     Readmission Risk Interventions    07/07/2024    3:27 PM 10/12/2023   11:35 AM 08/25/2022    3:58 PM  Readmission Risk Prevention Plan  Transportation Screening Complete Complete Complete  Home Care Screening  Complete Complete  Medication Review (RN CM)  Complete Complete  Medication Review (RN Care Manager) Complete    PCP or Specialist appointment within 3-5 days of discharge Not Complete    HRI or Home Care Consult Complete    SW Recovery Care/Counseling Consult Complete    Palliative Care Screening Not Applicable    Skilled Nursing Facility Complete

## 2024-07-11 NOTE — Progress Notes (Signed)
 All wound care performed to sacral wounds, dressings also changed to suprapubic catheter and nephrostomy tube, patient tolerated fairly. Report called and given to maurice,LPN at [facility].

## 2024-07-11 NOTE — Discharge Summary (Signed)
 Physician Discharge Summary   Patient: Jordan Ward MRN: [id number] DOB: July 10, 1941  Admit date:     07/06/2024  Discharge date: 07/11/24  Discharge Physician: Bernardino KATHEE Come   PCP: Shona Norleen PEDLAR, MD   Recommendations at discharge:  Continue goals of care discussions. Pt suspected to have increased frequency of aspiration. Does not want to initiate thickened liquids and does not desire any artificial nutrition. Palliative care to follow at facility.  Continue cardiology evaluations after discharge for CHF.  Continue plans for IR PCN exchange later this week and routine urology f/u as scheduled.  Discharge Diagnoses: Principal Problem:   Acute hypoxemic respiratory failure (HCC) Active Problems:   Chronic diastolic (congestive) heart failure (HCC)   Nephrostomy present (HCC)   Dyslipidemia   HTN (hypertension), malignant  Hospital Course: Jordan Ward  is a 83 y.o. male,with medical history significant of pulmonary fibrosis/COPD, CAD, prior stroke, multiple sclerosis with neurogenic bladder, has had a longstanding suprapubic tube with atrophic right kidney and a left percutaneous nephrostomy tube, hypothyroidism, pressure ulcers, pneumonia, long-term resident at [facility]. - Patient sent from his facility due to complaints of shortness of breath, cough, for last 3 days, he is on room air at baseline, he is with progressive dyspnea started to require oxygen , no chest pain, no fever, no chills, no nausea, no vomiting, he does report cough intermittently with eating, he has chronic suprapubic catheter and left nephrostomy tube. - Patient initially on 8 L oxygen  by EMS, but he is currently on 4 L oxygen , ABG significant for low PaO2, chest x-ray significant for left lung opacity, labs significant for low potassium at 3.1, creatinine at 2.9, up from baseline of 1.94, he has significant leukocytosis at 23K, lactic acid elevated at 2.8, troponins elevated at 236, and his proBNP elevated at 21 393,  patient started on IV fluids, antibiotics and [facility] consulted to admit  Assessment and Plan: Sepsis, POA Acute on chronic respiratory failure with hypoxemia Pneumonia: Significant for sepsis POA, elevated lactic acid, soft blood pressure and leukocytosis. Legionella and pneumococcal Ag's negative.  - Known dysphagia and aspiration: SLP evaluated (well known to the service) and feels the patient is at risk of aspirating thin liquids during swallow AND due to Zenker diverticulum and delayed emptying of that into pharynx, he's at risk of even aspirating thickened liquids AFTER swallowing. The patient has made it clear that he intends to continue thin liquids, so we've ordered dysphagia 3 diet with thin liquids. WBC up again albeit overall still down from arrival, noted to be up since Jan 2025, strongly suggestive of ongoing silent and overt aspiration. I suggested thickening liquids though pt declines, also declines discussions about artificial nutrition.   - Aspiration precautions - Follow-up blood cultures (NG5D), sputum culture (still says sent/pending).   - Received 5 days vancomycin , cefepime , flagyl . Will continue augmentin  for 2 more days after discharge for suspicion of aspiration. - Continue supplemental oxygen , was on 1L O2 previously, now on 1-3L.    Lactic acidosis: Cleared, persistent NAGMA likely due to AKI, though bicarb and Cr continue improving.    AKI on stage IIIb CKD: Cr improved from admission from 2.9 > 2.3.  - Suggest rechecking in next week, discharge creatinine down to 1.9.    Hypokalemia: Resolved with supplementation.  - Continue chronic K 20 mEq.   Prolonged  QTc interval:  - Avoid provocative agents - Optimize electrolytes.    Chronic combined HFrEF, HTN: Jan 2025 LVEF was 30 -  35%. BNP elevated at baseline, though no hypervolemia currently.  - Normotensive, so will continue holding norvasc . Doesn't appear to be on GDMT, presumably due to CKD and  hemodynamic baseline. Will encourage cardiology follow up.     Demand myocardial ischemia: Troponin trending downward, no chest pain. This was due to sepsis and respiratory failure related to pneumonia.  - No further inpatient evaluation is planned. Suggest outpatient cardiology follow up unless angina develops.     Neurogenic bladder, obstructive uropathy, chronic percutaneous nephrostomy tube: UA has few bacteria.  - Follow up at [facility] 10/16 at 2:30pm for nephrostomy exchange - Follow up with [facility], Dr. Matilda 10/28.    COPD, pulmonary fibrosis, chronic hypoxic respiratory failure:  - States he uses oxygen  as needed at baseline (MAR confirms this) - Does not appear to have ongoing bronchospasm, though irritation from aspiration is suspected. - Continue scheduled duonebs, prn albuterol  - No further steroids planned, received solumedrol 10/8-10/9   GERD - Continue with PPI   Hypothyroidism:   - Continue levothyroxine    Deconditioning/multiple sclerosis: Wheelchair dependent  - Plan to continue PT/OT at LTC facility after discharge   Stage 4 sacral pressure injury (chronic), Stage 3 right buttock pressure injury (chronic), Stage 3 left buttock pressure injury (chronic): All POA. Pt has several chronic wounds. He is critically ill with multiple systemic factors which can impair healing.  - Continue to offload as much as possible, low airloss mattress/turning.  - Optimize nutritional status - Per WOC:  - Pack sacrum and right buttocks with Aquacel Soila # [id number]) Q day, using swab to fill, then cover with foam dressing.  - Apply a piece of Aquacel over left buttocks wound Q day, and cover with foam dressing.   Consultants: WOC Procedures performed: None  Disposition: Long term care facility Diet recommendation: Dysphagia 3 diet. Recommend thickened liquids, though pt opts for continued thin liquids. DISCHARGE MEDICATION: Allergies as of 07/11/2024       Reactions    Ciprofloxacin  Other (See Comments)   Trouble swallowing according to wife Clemens out in the floor Pt can tolerate Levaquin    Tetracyclines & Related Anaphylaxis, Rash        Medication List     STOP taking these medications    amLODipine  5 MG tablet Commonly known as: NORVASC    azithromycin  500 MG tablet Commonly known as: ZITHROMAX    guaiFENesin  600 MG 12 hr tablet Commonly known as: MUCINEX        TAKE these medications    acetaminophen  500 MG tablet Commonly known as: TYLENOL  Take 1,000 mg by mouth in the morning and at bedtime. For pain related to fall   acetaminophen  325 MG tablet Commonly known as: TYLENOL  Take 650 mg by mouth every 4 (four) hours as needed for mild pain (pain score 1-3) or fever. Do not exceed 3000 mg in 24 hours.   amoxicillin -clavulanate 500-125 MG tablet Commonly known as: AUGMENTIN  Take 1 tablet by mouth 2 (two) times daily for 2 days.   ascorbic acid  500 MG tablet Commonly known as: VITAMIN C Take 500 mg by mouth every morning.   aspirin  81 MG chewable tablet Chew 1 tablet (81 mg total) by mouth daily with breakfast.   busPIRone 5 MG tablet Commonly known as: BUSPAR Take 5 mg by mouth 3 (three) times daily.   carboxymethylcellulose 1 % ophthalmic solution Place 2 drops into both eyes every 4 (four) hours as needed (dry eyes).   diazepam  2 MG tablet Commonly known  as: VALIUM  Take 1 tablet (2 mg total) by mouth 2 (two) times daily.   HYDROcodone -acetaminophen  5-325 MG tablet Commonly known as: NORCO/VICODIN Take 1 tablet by mouth every 8 (eight) hours as needed for moderate pain (pain score 4-6).   ipratropium-albuterol  0.5-2.5 (3) MG/3ML Soln Commonly known as: DUONEB Take 3 mLs by nebulization every 4 (four) hours as needed (wheezing, shortness of breath, O2 sat<90%).   levothyroxine  75 MCG tablet Commonly known as: SYNTHROID  Take 75 mcg by mouth in the morning.   lidocaine  4 % Place 1 patch onto the skin every 12  (twelve) hours. Directions per [facility]? Apply to right shoulder for up to 12 hours then apply at bedtime for pain and remove at 10 pm Application times: 0800, 2200   loratadine 10 MG tablet Commonly known as: CLARITIN Take 10 mg by mouth at bedtime.   melatonin 3 MG Tabs tablet Take 3 mg by mouth at bedtime.   methocarbamol  500 MG tablet Commonly known as: ROBAXIN  Take 500 mg by mouth every 6 (six) hours as needed for muscle spasms.   mirtazapine  15 MG tablet Commonly known as: Remeron  Take 0.5 tablets (7.5 mg total) by mouth at bedtime. What changed: how much to take   multivitamin tablet Take 1 tablet by mouth every morning.   omeprazole 20 MG tablet Commonly known as: PRILOSEC OTC Take 20 mg by mouth daily.   oxybutynin  5 MG tablet Commonly known as: DITROPAN  Take 5 mg by mouth 2 (two) times daily.   OXYGEN  Inhale 1 L into the lungs as needed (shortness of breath, O2 sats <88%).   polyethylene glycol 17 g packet Commonly known as: MIRALAX  / GLYCOLAX  Take 17 g by mouth every morning.   potassium chloride  20 MEQ packet Commonly known as: KLOR-CON  Take 20 mEq by mouth daily.   PRO-STAT PROFILE PO Take 30 mLs by mouth 2 (two) times daily.   SALINE FLUSH IV Irrigate with 5 mLs as directed 2 (two) times a week. Mondays and Thursdays   SALINE FLUSH IV Irrigate with 5 mLs as directed every 8 (eight) hours as needed (irrigation).   senna-docusate 8.6-50 MG tablet Commonly known as: Senokot-S Take 2 tablets by mouth at bedtime.   sodium chloride  0.65 % Soln nasal spray Commonly known as: OCEAN Place 1 spray into both nostrils in the morning, at noon, in the evening, and at bedtime. For nasal dryness   thiamine  100 MG tablet Commonly known as: Vitamin B-1 Take 1 tablet (100 mg total) by mouth daily.        Contact information for follow-up providers     Shona Norleen PEDLAR, MD Follow up.   Specialty: Internal Medicine Contact information: [address] [place] [address] [phone]              Contact information for after-discharge care     Destination     [facility] .   Service: Skilled Nursing Contact information: [address] [place]  [address] [phone]                    Discharge Exam: Fredricka Weights   07/06/24 1416 07/06/24 2012  Weight: 50.9 kg 50.5 kg  BP 135/81 (BP Location: Left Arm)   Pulse 79   Temp 98 F (36.7 C) (Oral)   Resp 20   Ht 5' 5 (1.651 m)   Wt 50.5 kg   SpO2 92%   BMI 18.53  kg/m   Chronically ill-appearing male in no acute distress Nonlabored, no wheezes Decreased muscle bulk RRR, no MRG or edema Soft, +BS, catheter appears uninfected Dressing for PCN c/d/i  Condition at discharge: stable  The results of significant diagnostics from this hospitalization (including imaging, microbiology, ancillary and laboratory) are listed below for reference.   Imaging Studies: DG Chest Port 1 View Result Date: 07/06/2024 CLINICAL DATA:  Dyspnea.  Cough. EXAM: PORTABLE CHEST 1 VIEW COMPARISON:  02/22/2024. FINDINGS: Patchy densities in the mid and lower right chest are again noted. It is difficult to exclude slightly increased densities in these areas, particularly in the right mid lung region. Coarse lung markings appear stable. Heart and mediastinum are grossly stable. Atherosclerotic calcifications at the aortic arch. Negative for pneumothorax. No acute bone abnormality. IMPRESSION: 1. Chronic changes in the right lung. Difficult to exclude acute on chronic disease in this area. Electronically Signed   By: Juliene Balder M.D.   On: 07/06/2024 15:37   CT ANGIO AO+BIFEM W & OR WO CONTRAST Result Date: 06/22/2024 CLINICAL DATA:  Lower extremity peripheral artery disease (atherosclerosis of native arteries) with ulceration EXAM: CT ANGIOGRAPHY OF ABDOMINAL AORTA WITH ILIOFEMORAL RUNOFF TECHNIQUE: Multidetector CT imaging of the  abdomen, pelvis and lower extremities was performed using the standard protocol during bolus administration of intravenous contrast. Multiplanar CT image reconstructions and MIPs were obtained to evaluate the vascular anatomy. RADIATION DOSE REDUCTION: This exam was performed according to the departmental dose-optimization program which includes automated exposure control, adjustment of the mA and/or kV according to patient size and/or use of iterative reconstruction technique. CONTRAST:  80mL OMNIPAQUE  IOHEXOL  350 MG/ML SOLN COMPARISON:  Prior CT scan of the abdomen and pelvis 02/22/2024 FINDINGS: VASCULAR Aorta: Complete occlusion of the aorta just beyond the origin of the left renal artery. The aorta is occluded throughout the infrarenal segment. Celiac: Moderate stenosis of the celiac artery due to mixed calcified and fibrofatty atherosclerotic plaque. SMA: Heavily calcified atherosclerotic plaque results in moderate stenosis of the proximal SMA. Renals: The right renal artery is chronically occluded. The left renal artery demonstrates moderate stenosis proximally due to a calcified atherosclerotic plaque. The distal renal artery and anterior and posterior divisions demonstrate a corrugated appearance suggesting fibromuscular dysplasia. No aneurysm or dissection. IMA: Chronically occluded. RIGHT Lower Extremity Inflow: Complete occlusion of the iliac system. Occlusion appears chronic given the small attenuated appearance of the vessels. Outflow: Reconstitution of the common femoral artery largely via inferior epigastric and ascending ileo lumbar collaterals. Calcified plaque results in mild luminal loss. The profunda femoral and superficial femoral arteries are patent but demonstrate scattered atherosclerotic plaque with perhaps mild stenosis. Similarly, the popliteal artery demonstrates mild calcified plaque without significant stenosis. Runoff: Patent 3 vessel runoff to the ankle. LEFT Lower Extremity Inflow:  Complete chronic occlusion of the native iliac system. Marked hypertrophy of the left inferior epigastric artery reconstitutes the common femoral artery. Outflow: Mild calcified plaque in the common femoral artery resulting in approximately 50% luminal loss. The calcification extends into the origin of the superficial femoral artery resulting in mild to moderate stenosis. The profunda femoral branches are widely patent. Heavily calcified atherosclerotic plaque results in at least moderate stenosis in Hunter's canal. The popliteal artery is relatively spared from disease. Runoff: Patent 3 vessel runoff to the ankle. Veins: No focal venous abnormality. Review of the MIP images confirms the above findings. NON-VASCULAR Lower chest: Calcifications visualized along the coronary arteries. Combined centrilobular emphysema with subpleural reticulation, architectural distortion and early  honeycombing likely reflecting a combination of emphysema and fibrotic change. Hepatobiliary: No focal liver abnormality is seen. Status post cholecystectomy. No biliary dilatation. Pancreas: Unremarkable. No pancreatic ductal dilatation or surrounding inflammatory changes. Spleen: No splenic injury or perisplenic hematoma. Adrenals/Urinary Tract: Unremarkable adrenal glands. Atrophied right kidney containing several simple cysts. Percutaneous nephrostomy tube within the left renal collecting system. No hydronephrosis. Left pelvic kidney stone measures 1.5 cm. Several water  attenuation simple cysts are noted exophytic from the upper and lower pole. No further follow-up recommended. The ureters are unremarkable. A suprapubic catheter is present within the bladder. Stomach/Bowel: No bowel Gunner thickening or evidence of obstruction. Lymphatic: No suspicious lymphadenopathy. Reproductive: Prostate is unremarkable. Other: Diastasis recti with large ventral hernia. Right ischial decubitus ulcer. No ascites. Musculoskeletal: No acute osseous  abnormality. L5-S1 degenerative disc disease. IMPRESSION: VASCULAR 1. Complete aortoiliac occlusive disease beginning just below the renal artery origin and extending through the external iliac arteries bilaterally with reconstitution via Winslow's pathway (internal mammary to inferior epigastric to distal external iliac) on the left and a combination of Winslow's pathway and iliolumbar collaterals on the right. 2. Additional calcified atherosclerotic plaque results in mild to moderate stenosis of the common femoral arteries in the distal superficial femoral arteries bilaterally slightly worse on the left than the right. 3. Patent 3 vessel runoff to the ankles bilaterally. 4. Moderate stenosis of the celiac artery. 5. Moderate stenosis of the SMA. 6. 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 7. Moderate stenosis of the left renal artery origin. 8. Probable fibromuscular dysplasia in the more distal left renal artery. NON-VASCULAR 1. Right ischial decubitus ulceration extending to the bone. No overt evidence of osteomyelitis. 2. Centrilobular pulmonary emphysema and probable pulmonary fibrosis. 3. Left percutaneous nephrostomy tube in place without evidence of hydronephrosis. 4. Suprapubic catheter in place. 5. Additional ancillary findings as above. Electronically Signed   By: Wilkie Lent M.D.   On: 06/22/2024 08:30    Microbiology: Results for orders placed or performed during the hospital encounter of 07/06/24  Culture, blood (routine x 2)     Status: None   Collection Time: 07/06/24  2:38 PM   Specimen: BLOOD  Result Value Ref Range Status   Specimen Description BLOOD  Final   Special Requests NONE  Final   Culture   Final    NO GROWTH 5 DAYS Performed at [facility], [address] [place] [place], [place] [address]    Report Status 07/11/2024 FINAL  Final  Resp panel by RT-PCR (RSV, Flu A&B, Covid) Anterior Nasal Swab     Status: None   Collection Time: 07/06/24  2:50 PM   Specimen: Anterior  Nasal Swab  Result Value Ref Range Status   SARS Coronavirus 2 by RT PCR NEGATIVE NEGATIVE Final    Comment: (NOTE) SARS-CoV-2 target nucleic acids are NOT DETECTED.  The SARS-CoV-2 RNA is generally detectable in upper respiratory specimens during the acute phase of infection. The lowest concentration of SARS-CoV-2 viral copies this assay can detect is 138 copies/mL. A negative result does not preclude SARS-Cov-2 infection and should not be used as the sole basis for treatment or other patient management decisions. A negative result may occur with  improper specimen collection/handling, submission of specimen other than nasopharyngeal swab, presence of viral mutation(s) within the areas targeted by this assay, and inadequate number of viral copies(<138 copies/mL). A negative result must be combined with clinical observations, patient history, and epidemiological information. The expected result is Negative.  Fact Sheet for Patients:  BloggerCourse.com  Fact Sheet for Healthcare Providers:  SeriousBroker.it  This test is no t yet approved or cleared by the [place]  FDA and  has been authorized for detection and/or diagnosis of SARS-CoV-2 by FDA under an Emergency Use Authorization (EUA). This EUA will remain  in effect (meaning this test can be used) for the duration of the COVID-19 declaration under Section 564(b)(1) of the Act, 21 U.S.C.section 360bbb-3(b)(1), unless the authorization is terminated  or revoked sooner.       Influenza A by PCR NEGATIVE NEGATIVE Final   Influenza B by PCR NEGATIVE NEGATIVE Final    Comment: (NOTE) The Xpert Xpress SARS-CoV-2/FLU/RSV plus assay is intended as an aid in the diagnosis of influenza from Nasopharyngeal swab specimens and should not be used as a sole basis for treatment. Nasal washings and aspirates are unacceptable for Xpert Xpress SARS-CoV-2/FLU/RSV testing.  Fact Sheet for  Patients: BloggerCourse.com  Fact Sheet for Healthcare Providers: SeriousBroker.it  This test is not yet approved or cleared by the [place]  FDA and has been authorized for detection and/or diagnosis of SARS-CoV-2 by FDA under an Emergency Use Authorization (EUA). This EUA will remain in effect (meaning this test can be used) for the duration of the COVID-19 declaration under Section 564(b)(1) of the Act, 21 U.S.C. section 360bbb-3(b)(1), unless the authorization is terminated or revoked.     Resp Syncytial Virus by PCR NEGATIVE NEGATIVE Final    Comment: (NOTE) Fact Sheet for Patients: BloggerCourse.com  Fact Sheet for Healthcare Providers: SeriousBroker.it  This test is not yet approved or cleared by the [place]  FDA and has been authorized for detection and/or diagnosis of SARS-CoV-2 by FDA under an Emergency Use Authorization (EUA). This EUA will remain in effect (meaning this test can be used) for the duration of the COVID-19 declaration under Section 564(b)(1) of the Act, 21 U.S.C. section 360bbb-3(b)(1), unless the authorization is terminated or revoked.  Performed at [facility], [address], [place], [place] [address]   Culture, blood (routine x 2)     Status: None   Collection Time: 07/06/24  2:53 PM   Specimen: BLOOD  Result Value Ref Range Status   Specimen Description BLOOD BLOOD RIGHT ARM  Final   Special Requests   Final    Blood Culture adequate volume BOTTLES DRAWN AEROBIC ONLY   Culture   Final    NO GROWTH 5 DAYS Performed at [facility], [address], [place], [place] [address]    Report Status 07/11/2024 FINAL  Final  MRSA Next Gen by PCR, Nasal     Status: Abnormal   Collection Time: 07/06/24  8:05 PM   Specimen: Nasal Mucosa; Nasal Swab  Result Value Ref Range Status   MRSA by PCR Next Gen DETECTED (A) NOT DETECTED Final    Comment:  RESULT CALLED TO, READ BACK BY AND VERIFIED WITH: B. SUPER [id number], VIRAY,J (NOTE) The GeneXpert MRSA Assay (FDA approved for NASAL specimens only), is one component of a comprehensive MRSA colonization surveillance program. It is not intended to diagnose MRSA infection nor to guide or monitor treatment for MRSA infections. Test performance is not FDA approved in patients less than 11 years old. Performed at [facility], [address] [place], [place] [address]     Labs: CBC: Recent Labs  Lab 07/06/24 1456 07/07/24 0334 07/08/24 0317 07/10/24 0407 07/11/24 0400  WBC 23.9* 14.5* 13.7* 17.7* 18.2*  NEUTROABS 19.0*  --   --   --   --  HGB 10.3* 9.1* 8.5* 9.4* 9.7*  HCT 33.2* 29.6* 27.8* 31.6* 31.9*  MCV 86.9 86.3 86.6 87.8 87.2  PLT 291 250 278 351 338   Basic Metabolic Panel: Recent Labs  Lab 07/06/24 1456 07/07/24 0334 07/08/24 0317 07/10/24 0407 07/11/24 0400  NA 139 139 139 143 141  K 3.1* 3.8 3.9 4.6 4.6  CL 102 107 111 112* 111  CO2 20* 18* 16* 18* 19*  GLUCOSE 122* 172* 185* 127* 112*  BUN 56* 51* 71* 89* 75*  CREATININE 2.91* 2.54* 2.48* 2.35* 1.96*  CALCIUM  9.4 8.9 9.2 9.3 8.8*  MG 1.9  --   --   --   --    Liver Function Tests: Recent Labs  Lab 07/06/24 1456  AST 24  ALT 10  ALKPHOS 124  BILITOT 0.4  PROT 8.5*  ALBUMIN 3.3*   CBG: No results for input(s): GLUCAP in the last 168 hours.  Discharge time spent: greater than 30 minutes.  Signed: Bernardino KATHEE Come, MD [facility] 07/11/2024

## 2024-07-12 DIAGNOSIS — R278 Other lack of coordination: Secondary | ICD-10-CM | POA: Diagnosis not present

## 2024-07-12 DIAGNOSIS — R1312 Dysphagia, oropharyngeal phase: Secondary | ICD-10-CM | POA: Diagnosis not present

## 2024-07-12 DIAGNOSIS — A419 Sepsis, unspecified organism: Secondary | ICD-10-CM | POA: Diagnosis not present

## 2024-07-12 DIAGNOSIS — I129 Hypertensive chronic kidney disease with stage 1 through stage 4 chronic kidney disease, or unspecified chronic kidney disease: Secondary | ICD-10-CM | POA: Diagnosis not present

## 2024-07-12 DIAGNOSIS — F419 Anxiety disorder, unspecified: Secondary | ICD-10-CM | POA: Diagnosis not present

## 2024-07-12 DIAGNOSIS — M4628 Osteomyelitis of vertebra, sacral and sacrococcygeal region: Secondary | ICD-10-CM | POA: Diagnosis not present

## 2024-07-12 DIAGNOSIS — J9600 Acute respiratory failure, unspecified whether with hypoxia or hypercapnia: Secondary | ICD-10-CM | POA: Diagnosis not present

## 2024-07-12 DIAGNOSIS — R2689 Other abnormalities of gait and mobility: Secondary | ICD-10-CM | POA: Diagnosis not present

## 2024-07-12 DIAGNOSIS — M6281 Muscle weakness (generalized): Secondary | ICD-10-CM | POA: Diagnosis not present

## 2024-07-12 DIAGNOSIS — E440 Moderate protein-calorie malnutrition: Secondary | ICD-10-CM | POA: Diagnosis not present

## 2024-07-13 ENCOUNTER — Other Ambulatory Visit (HOSPITAL_COMMUNITY)

## 2024-07-13 DIAGNOSIS — R2689 Other abnormalities of gait and mobility: Secondary | ICD-10-CM | POA: Diagnosis not present

## 2024-07-13 DIAGNOSIS — M4628 Osteomyelitis of vertebra, sacral and sacrococcygeal region: Secondary | ICD-10-CM | POA: Diagnosis not present

## 2024-07-13 DIAGNOSIS — E440 Moderate protein-calorie malnutrition: Secondary | ICD-10-CM | POA: Diagnosis not present

## 2024-07-13 DIAGNOSIS — J449 Chronic obstructive pulmonary disease, unspecified: Secondary | ICD-10-CM | POA: Diagnosis not present

## 2024-07-13 DIAGNOSIS — M6281 Muscle weakness (generalized): Secondary | ICD-10-CM | POA: Diagnosis not present

## 2024-07-13 DIAGNOSIS — R1312 Dysphagia, oropharyngeal phase: Secondary | ICD-10-CM | POA: Diagnosis not present

## 2024-07-13 DIAGNOSIS — I129 Hypertensive chronic kidney disease with stage 1 through stage 4 chronic kidney disease, or unspecified chronic kidney disease: Secondary | ICD-10-CM | POA: Diagnosis not present

## 2024-07-13 DIAGNOSIS — R278 Other lack of coordination: Secondary | ICD-10-CM | POA: Diagnosis not present

## 2024-07-13 DIAGNOSIS — J690 Pneumonitis due to inhalation of food and vomit: Secondary | ICD-10-CM | POA: Diagnosis not present

## 2024-07-14 ENCOUNTER — Other Ambulatory Visit (HOSPITAL_COMMUNITY)

## 2024-07-14 DIAGNOSIS — G35D Multiple sclerosis, unspecified: Secondary | ICD-10-CM | POA: Diagnosis not present

## 2024-07-14 DIAGNOSIS — M6281 Muscle weakness (generalized): Secondary | ICD-10-CM | POA: Diagnosis not present

## 2024-07-14 DIAGNOSIS — R2689 Other abnormalities of gait and mobility: Secondary | ICD-10-CM | POA: Diagnosis not present

## 2024-07-14 DIAGNOSIS — L89314 Pressure ulcer of right buttock, stage 4: Secondary | ICD-10-CM | POA: Diagnosis not present

## 2024-07-14 DIAGNOSIS — E440 Moderate protein-calorie malnutrition: Secondary | ICD-10-CM | POA: Diagnosis not present

## 2024-07-14 DIAGNOSIS — R278 Other lack of coordination: Secondary | ICD-10-CM | POA: Diagnosis not present

## 2024-07-14 DIAGNOSIS — G894 Chronic pain syndrome: Secondary | ICD-10-CM | POA: Diagnosis not present

## 2024-07-14 DIAGNOSIS — L89620 Pressure ulcer of left heel, unstageable: Secondary | ICD-10-CM | POA: Diagnosis not present

## 2024-07-14 DIAGNOSIS — L89154 Pressure ulcer of sacral region, stage 4: Secondary | ICD-10-CM | POA: Diagnosis not present

## 2024-07-14 DIAGNOSIS — R159 Full incontinence of feces: Secondary | ICD-10-CM | POA: Diagnosis not present

## 2024-07-14 DIAGNOSIS — R1312 Dysphagia, oropharyngeal phase: Secondary | ICD-10-CM | POA: Diagnosis not present

## 2024-07-14 DIAGNOSIS — L89616 Pressure-induced deep tissue damage of right heel: Secondary | ICD-10-CM | POA: Diagnosis not present

## 2024-07-15 ENCOUNTER — Telehealth: Payer: Self-pay | Admitting: Pharmacist

## 2024-07-15 DIAGNOSIS — R278 Other lack of coordination: Secondary | ICD-10-CM | POA: Diagnosis not present

## 2024-07-15 DIAGNOSIS — A419 Sepsis, unspecified organism: Secondary | ICD-10-CM | POA: Diagnosis not present

## 2024-07-15 DIAGNOSIS — I82421 Acute embolism and thrombosis of right iliac vein: Secondary | ICD-10-CM | POA: Diagnosis not present

## 2024-07-15 DIAGNOSIS — E440 Moderate protein-calorie malnutrition: Secondary | ICD-10-CM | POA: Diagnosis not present

## 2024-07-15 DIAGNOSIS — R1312 Dysphagia, oropharyngeal phase: Secondary | ICD-10-CM | POA: Diagnosis not present

## 2024-07-15 DIAGNOSIS — J690 Pneumonitis due to inhalation of food and vomit: Secondary | ICD-10-CM | POA: Diagnosis not present

## 2024-07-15 DIAGNOSIS — M6281 Muscle weakness (generalized): Secondary | ICD-10-CM | POA: Diagnosis not present

## 2024-07-15 DIAGNOSIS — R2689 Other abnormalities of gait and mobility: Secondary | ICD-10-CM | POA: Diagnosis not present

## 2024-07-15 DIAGNOSIS — M4628 Osteomyelitis of vertebra, sacral and sacrococcygeal region: Secondary | ICD-10-CM | POA: Diagnosis not present

## 2024-07-15 NOTE — Telephone Encounter (Signed)
 Received a message from Duwaine Ligas, NP at [facility] stating the patient has been diagnosed with a DVT, asking for blood thinner recommendations. Ultrasound report was faxed over and shows sub-occlusive, non-compressive thrombus involving the left distal common femoral vein, proximal superficial femoral vein, and proximal profunda femoris vein. Spoke to Duwaine Ligas, NP on the phone. She expressed concern over if the risks of starting on a blood thinner outweigh the benefits. She expressed concern given his worsening kidney function (most recent Cr she stated was 3.5) and his co morbidities. Stated that he currently cannot sit up for long periods of times due to his sacral pressure wound. She reported that they are planning to have hospice discussions with him and his family. She was considering starting him on warfarin if the family elected to start anticoagulation due to his kidney function and asked if we would recommend starting on a blood thinner and if so which one. Discussed recommendation to start on Eliquis which can be used in renal dysfunction, including dialysis. She asked what dosing we would recommend as she would typically adjust the dose for his kidney function. Recommended to start him on Eliquis VTE starter pack dosing (Eliquis 10 mg (2 tablets) twice daily for 7 days followed by 5 mg (1 tablet) twice daily). No renal dose adjustments needed for VTE indication. She stated she would have a conversation with his family regarding risks and benefits of Eliquis treatment. Discussed that it would be our recommendation to start on anticoagulation and they should be made aware of risk of PE and death if he is not started on anticoagulation. She confirmed understanding.

## 2024-07-18 DIAGNOSIS — M4628 Osteomyelitis of vertebra, sacral and sacrococcygeal region: Secondary | ICD-10-CM | POA: Diagnosis not present

## 2024-07-18 DIAGNOSIS — I129 Hypertensive chronic kidney disease with stage 1 through stage 4 chronic kidney disease, or unspecified chronic kidney disease: Secondary | ICD-10-CM | POA: Diagnosis not present

## 2024-07-18 DIAGNOSIS — N1832 Chronic kidney disease, stage 3b: Secondary | ICD-10-CM | POA: Diagnosis not present

## 2024-07-18 DIAGNOSIS — R319 Hematuria, unspecified: Secondary | ICD-10-CM | POA: Diagnosis not present

## 2024-07-19 ENCOUNTER — Ambulatory Visit (INDEPENDENT_AMBULATORY_CARE_PROVIDER_SITE_OTHER): Admitting: Vascular Surgery

## 2024-07-19 ENCOUNTER — Ambulatory Visit: Admitting: Vascular Surgery

## 2024-07-19 DIAGNOSIS — I739 Peripheral vascular disease, unspecified: Secondary | ICD-10-CM

## 2024-07-19 NOTE — Progress Notes (Signed)
 [facility] [place]  ASSESSMENT / PLAN: Jordan Ward is a 83 y.o. male with atherosclerosis of native arteries of bilateral lower extremities; left decubitus heel ulceration.  Recommend:  Abstinence from all tobacco products. Blood glucose control with goal A1c < 7%. Blood pressure control with goal blood pressure < 130/80 mmHg. Lipid reduction therapy with goal LDL-C < 55 mg/dL. Aspirin  81mg  by mouth daily. Atorvastatin  40-80mg  PO QD (or other high intensity statin therapy).  Patient extremely frail; he is not a candidate for open revascularization, unfortunately he would need aortobifemoral bypass or axillobifemoral bypass to treat aortoiliac occlusion.  He is not a candidate for this.  Recommend transition to hospice care.  Follow-up with me as needed.  CHIEF COMPLAINT: Heel ulcer  HISTORY OF PRESENT ILLNESS: Jordan Ward is a 83 y.o. male referred to clinic for evaluation of left posterior heel wound.  This has been present for many months.  He has been getting wound care for this and a stage IV sacral decubitus ulcer for a long period of time.  He has a history of multiple sclerosis and neurogenic bladder for which he is dependent on suprapubic tube as well as a nephrostomy tube.  He has a large midline ventral hernia.  He is profoundly deconditioned.  He is minimally ambulatory, and is seen today in a wheelchair.  He is with his wife today who supplements the history.  We reviewed his noninvasive testing in detail.  We reviewed the danger of heel ulceration in the setting of peripheral arterial disease.  07/19/24: Plan discussion with patient's wife.  We reviewed his CT scan.  She reviewed recent health challenges including new diagnosis of DVT.  No real change in decubitus ulcer of heel or sacral wound, per patient's wife's report.  I recommended against any vascular intervention, and recommended he transition to hospice care.  About 10 minutes was spent reviewing  the CT scan and discussing with the patient's wife.  Past Medical History:  Diagnosis Date   Anemia    Arthritis    Back pain, chronic    Bilateral carotid bruits    C. difficile colitis 09/2011   CAD (coronary artery disease)    Carotid artery stenosis    Cerebrovascular disease    Cerebrovascular disease 08/17/2018   Colon polyps    Complication of cystostomy catheter, initial encounter 04/20/2020   Dyslipidemia    Dysphagia 10/07/2011   Encephalopathy    Gait disorder    HA (headache)    High grade dysplasia in colonic adenoma 09/2005   History of kidney stones    HTN (hypertension), malignant 10/06/2011   Hypernatremia    Hypokalemia    Hypothyroidism 10/08/2011   Insomnia    Junctional rhythm    Kidney stones    MS (multiple sclerosis)    Nausea and vomiting 03/02/2020   Neuromuscular disorder (HCC)    MS   OSA (obstructive sleep apnea)    Paroxysmal atrial tachycardia    Peripheral vascular disease    Pneumonia 4 yrs ago   Pulmonary fibrosis (HCC) 10/06/2011   Pulmonary nodule 10/08/2011   PVD (peripheral vascular disease)    Sacral ulcer (HCC)    Sleep apnea    cannot tolerate   Stroke (HCC)    left sided weakness   Suprapubic catheter (HCC)    TIA (transient ischemic attack)    Tremors of nervous system 10/08/2011   Urinary tract infection    Ventral hernia without obstruction  or gangrene     Large 8X9cm ventral hernia with loss of domain. CT reads report as diastasis recti with herniation or diastasis recti.  Dr. Kallie, Surgery, reviewed CT with radiology and there is herniation with only hernia sac or peritoneum over the bowel and large separation of the rectus muslce (i.e. diastasis recti aka loss of domain).  No surgical intervention recommended given size, age, and health.     Past Surgical History:  Procedure Laterality Date   APPENDECTOMY  09/2005   at time of left hemicolectomy   BACK SURGERY  1976/1979   lower   BILIARY DILATION N/A  03/03/2020   Procedure: BILIARY DILATION;  Surgeon: Golda Claudis PENNER, MD;  [facility];  Service: Endoscopy;  Laterality: N/A;   CATARACT EXTRACTION W/PHACO Right 03/08/2018   Procedure: CATARACT EXTRACTION PHACO AND INTRAOCULAR LENS PLACEMENT RIGHT EYE;  Surgeon: Perley Hamilton, MD;  Location: [facility];  Service: Ophthalmology;  Laterality: Right;  CDE: 8.86   CATARACT EXTRACTION W/PHACO Left 04/05/2018   Procedure: CATARACT EXTRACTION PHACO AND INTRAOCULAR LENS PLACEMENT (IOC);  Surgeon: Perley Hamilton, MD;  Location: [facility];  Service: Ophthalmology;  Laterality: Left;  CDE: 7.36   CENTRAL LINE INSERTION-TUNNELED Right 09/11/2020   Procedure: PLACEMENT OF TUNNELED CENTRAL LINE INTO JUGULAR VEIN;  Surgeon: Kallie Manuelita BROCKS, MD;  Location: [facility];  Service: General;  Laterality: Right;   CHOLECYSTECTOMY     Dr. Claudene   COLON SURGERY  09/2005   Fleishman: four tubular adenomas, large adenomatous polyp with HIGH GRADE dysplasia   COLONOSCOPY  11/2004   Dr. Lendon sessile polyp splenic flexure, 10mm sessile polyp desc colon, tubulovillous adenoma (bx not removed)   COLONOSCOPY  01/2005   poor prep, polyp could not be found   COLONOSCOPY  05/2005   with EMR, polypectomy Dr. Norleen Dec, bx showed high grade dysplasia, partially resected   COLONOSCOPY  09/2005   Dr. Dec, [place] ink tattooing, four villous colon polyp (3 had been missed on previous colonoscopies due to limitations of procedures   COLONOSCOPY  09/2006   normal TI, no polyps   COLONOSCOPY  10/2007   Dr. Randol distal mammillations, benign bx, normal TI, random bx neg for microscopic colitis   CYSTOSCOPY W/ URETERAL STENT PLACEMENT Left 09/05/2020   Procedure: CYSTOSCOPY WITH RETROGRADE PYELOGRAM/URETERAL STENT PLACEMENT;  Surgeon: Cam Morene ORN, MD;  Location: [facility];  Service: Urology;  Laterality: Left;   CYSTOSCOPY WITH LITHOLAPAXY N/A 07/27/2018   Procedure: CYSTOSCOPY WITH LITHOLAPAXY VIA  SUPRAPUBIC TUBE;   Surgeon: Matilda Senior, MD;  Location: [facility];  Service: Urology;  Laterality: N/A;   CYSTOSCOPY WITH RETROGRADE PYELOGRAM, URETEROSCOPY AND STENT PLACEMENT Left 06/09/2017   Procedure: CYSTOSCOPY WITH LEFT RETROGRADE PYELOGRAM, LEFT URETEROSCOPY, LEFT URETEROSCOPIC STONE EXTRACTION, LEFT URETERAL STENT PLACEMENT;  Surgeon: Matilda Senior, MD;  Location: [facility];  Service: Urology;  Laterality: Left;   CYSTOSCOPY/URETEROSCOPY/HOLMIUM LASER/STENT PLACEMENT Left 05/22/2020   Procedure: CYSTOSCOPY/URETEROSCOPY/STENT PLACEMENT;  Surgeon: Matilda Senior, MD;  [facility]: [facility];  Service: Urology;  Laterality: Left;   ERCP N/A 03/03/2020   Procedure: ENDOSCOPIC RETROGRADE CHOLANGIOPANCREATOGRAPHY (ERCP);  Surgeon: Golda Claudis PENNER, MD;  [facility];  Service: Endoscopy;  Laterality: N/A;   ERCP N/A 04/20/2020   Procedure: ENDOSCOPIC RETROGRADE CHOLANGIOPANCREATOGRAPHY (ERCP);  Surgeon: Golda Claudis PENNER, MD;  [facility];  Service: Endoscopy;  Laterality: N/A;  to be done at 7:30am in OR   GASTROINTESTINAL STENT REMOVAL N/A 04/20/2020   Procedure: STENT REMOVAL;  Surgeon: Golda Claudis PENNER, MD;  [facility];  Service: Endoscopy;  Laterality: N/A;   INGUINAL HERNIA REPAIR  1971   bilateral   INSERTION OF SUPRAPUBIC CATHETER  06/09/2017   Procedure: EXCHANGE OF SUPRAPUBIC CATHETER;  Surgeon: Matilda Senior, MD;  Location: [facility];  Service: Urology;;   INSERTION OF SUPRAPUBIC CATHETER  05/22/2020   Procedure: SUPRAPUBIC CATHETER EXCHANGE;  Surgeon: Matilda Senior, MD;  Location: [facility];  Service: Urology;;   IR NEPHROSTOMY EXCHANGE LEFT  03/25/2021   IR NEPHROSTOMY EXCHANGE LEFT  05/20/2021   IR NEPHROSTOMY EXCHANGE LEFT  07/13/2021   IR NEPHROSTOMY EXCHANGE LEFT  09/09/2021   IR NEPHROSTOMY EXCHANGE LEFT  11/04/2021   IR NEPHROSTOMY EXCHANGE LEFT  12/23/2021   IR NEPHROSTOMY EXCHANGE LEFT  02/17/2022   IR NEPHROSTOMY EXCHANGE LEFT  04/14/2022   IR NEPHROSTOMY  EXCHANGE LEFT  06/09/2022   IR NEPHROSTOMY EXCHANGE LEFT  07/28/2022   IR NEPHROSTOMY EXCHANGE LEFT  09/09/2022   IR NEPHROSTOMY EXCHANGE LEFT  11/04/2022   IR NEPHROSTOMY EXCHANGE LEFT  12/30/2022   IR NEPHROSTOMY EXCHANGE LEFT  02/25/2023   IR NEPHROSTOMY EXCHANGE LEFT  03/31/2023   IR NEPHROSTOMY EXCHANGE LEFT  05/26/2023   IR NEPHROSTOMY EXCHANGE LEFT  07/21/2023   IR NEPHROSTOMY EXCHANGE LEFT  09/15/2023   IR NEPHROSTOMY EXCHANGE LEFT  12/22/2023   IR NEPHROSTOMY EXCHANGE LEFT  03/25/2024   IR NEPHROSTOMY EXCHANGE LEFT  04/28/2024   IR NEPHROSTOMY EXCHANGE LEFT  06/02/2024   IR NEPHROSTOMY PLACEMENT LEFT  05/26/2017   IR NEPHROSTOMY PLACEMENT LEFT  05/02/2020   IR NEPHROSTOMY PLACEMENT LEFT  02/15/2021   IR NEPHROSTOMY PLACEMENT LEFT  02/22/2024   IR NEPHROSTOMY TUBE CHANGE  10/29/2023   IR URETERAL STENT PERC REMOVAL MOD SED  03/25/2021   KIDNEY STONE SURGERY  09/13/2015   LITHOTRIPSY N/A 03/03/2020   Procedure: MECHANICAL LITHOTRIPSY WITH REMOVAL OF MULTIPLE STONE FRAGMENTS;  Surgeon: Golda Claudis PENNER, MD;  [facility];  Service: Endoscopy;  Laterality: N/A;   NEPHROLITHOTOMY Left 09/13/2015   Procedure: LEFT PERCUTANEOUS NEPHROLITHOTOMY ;  Surgeon: Senior Matilda, MD;  Location: [facility];  Service: Urology;  Laterality: Left;   NEPHROSTOMY TUBE REMOVAL  05/22/2020   Procedure: NEPHROSTOMY TUBE REMOVAL;  Surgeon: Matilda Senior, MD;  Location: [facility];  Service: Urology;;   [facility] REMOVAL Right 09/20/2020   Procedure: MINOR REMOVAL CENTRAL LINE;  Surgeon: Kallie Manuelita BROCKS, MD;  Location: [facility];  Service: General;  Laterality: Right;  Pt to arrive at 7:30am for procedure   REMOVAL OF STONES N/A 04/20/2020   Procedure: REMOVAL OF STONES;  Surgeon: Golda Claudis PENNER, MD;  [facility];  Service: Endoscopy;  Laterality: N/A;   SPHINCTEROTOMY  03/03/2020   Procedure: BILLARY SPHINCTEROTOMY;  Surgeon: Golda Claudis PENNER, MD;  [facility];  Service: Endoscopy;;    SUPRAPUBIC CATHETER INSERTION      Family History  Problem Relation Age of Onset   Cirrhosis Brother        etoh   Stroke Mother 99   Coronary artery disease Father 49   Heart attack Brother    Cancer Sister    Multiple sclerosis Other    Colon cancer Neg Hx     Social History   Socioeconomic History   Marital status: Married    Spouse name: Jordan Ward   Number of children: 2   Years of education: 10   Highest education level: Not on file  Occupational  History   Occupation: disability    Comment: [employer]    Employer: RETIRED  Tobacco Use   Smoking status: Former    Current packs/day: 0.00    Average packs/day: 1 pack/day for 25.0 years (25.0 ttl pk-yrs)    Types: Cigarettes    Start date: 03/04/1963    Quit date: 03/03/1988    Years since quitting: 36.4   Smokeless tobacco: Never  Vaping Use   Vaping status: Never Used  Substance and Sexual Activity   Alcohol  use: No   Drug use: No   Sexual activity: Not on file  Other Topics Concern   Not on file  Social History Narrative   Patient is married Jordan Ward) and  Lives w/ wife and daughter Jordan Ward   Drinks 1-2 cups caffeine daily    Patient is left handed.    Social Drivers of Corporate investment banker Strain: Low Risk  (11/03/2023)   Received from [facility]   Overall Financial Resource Strain (CARDIA)    Difficulty of Paying Living Expenses: Not very hard  Food Insecurity: No Food Insecurity (07/06/2024)   Hunger Vital Sign    Worried About Running Out of Food in the Last Year: Never true    Ran Out of Food in the Last Year: Never true  Transportation Needs: No Transportation Needs (07/06/2024)   PRAPARE - Administrator, Civil Service (Medical): No    Lack of Transportation (Non-Medical): No  Physical Activity: Inactive (11/03/2023)   Received from [facility]   Exercise Vital Sign    On average, how many days per week do you engage in moderate to strenuous exercise (like a brisk walk)?:  0 days    On average, how many minutes do you engage in exercise at this level?: 0 min  Stress: No Stress Concern Present (11/03/2023)   Received from [facility] - Occupational Stress Questionnaire    Feeling of Stress : Only a little  Social Connections: Moderately Isolated (07/06/2024)   Social Connection and Isolation Panel    Frequency of Communication with Friends and Family: More than three times a week    Frequency of Social Gatherings with Friends and Family: More than three times a week    Attends Religious Services: Never    Database administrator or Organizations: No    Attends Banker Meetings: Never    Marital Status: Married  Catering manager Violence: Not At Risk (07/06/2024)   Humiliation, Afraid, Rape, and Kick questionnaire    Fear of Current or Ex-Partner: No    Emotionally Abused: No    Physically Abused: No    Sexually Abused: No    Allergies  Allergen Reactions   Ciprofloxacin  Other (See Comments)    Trouble swallowing according to wife Clemens out in the floor Pt can tolerate Levaquin    Tetracyclines & Related Anaphylaxis and Rash    Current Outpatient Medications  Medication Sig Dispense Refill   acetaminophen  (TYLENOL ) 325 MG tablet Take 650 mg by mouth every 4 (four) hours as needed for mild pain (pain score 1-3) or fever. Do not exceed 3000 mg in 24 hours.     acetaminophen  (TYLENOL ) 500 MG tablet Take 1,000 mg by mouth in the morning and at bedtime. For pain related to fall     Amino Acids-Protein Hydrolys (PRO-STAT PROFILE PO) Take 30 mLs by mouth 2 (two) times daily.  ascorbic acid  (VITAMIN C) 500 MG tablet Take 500 mg by mouth every morning.     aspirin  81 MG chewable tablet Chew 1 tablet (81 mg total) by mouth daily with breakfast. 30 tablet 11   busPIRone (BUSPAR) 5 MG tablet Take 5 mg by mouth 3 (three) times daily.     carboxymethylcellulose 1 % ophthalmic solution Place 2 drops into  both eyes every 4 (four) hours as needed (dry eyes).     diazepam  (VALIUM ) 2 MG tablet Take 1 tablet (2 mg total) by mouth 2 (two) times daily. 6 tablet 0   HYDROcodone -acetaminophen  (NORCO/VICODIN) 5-325 MG tablet Take 1 tablet by mouth every 8 (eight) hours as needed for moderate pain (pain score 4-6). 9 tablet 0   levothyroxine  (SYNTHROID ) 75 MCG tablet Take 75 mcg by mouth in the morning.     lidocaine  4 % Place 1 patch onto the skin every 12 (twelve) hours. Directions per [facility]:? Apply to right shoulder for up to 12 hours then apply at bedtime for pain and remove at 10 pm Application times: 0800, 2200     loratadine (CLARITIN) 10 MG tablet Take 10 mg by mouth at bedtime.     melatonin 3 MG TABS tablet Take 3 mg by mouth at bedtime.     methocarbamol  (ROBAXIN ) 500 MG tablet Take 500 mg by mouth every 6 (six) hours as needed for muscle spasms.     mirtazapine  (REMERON ) 15 MG tablet Take 0.5 tablets (7.5 mg total) by mouth at bedtime. (Patient taking differently: Take 15 mg by mouth at bedtime.) 15 tablet 1   Multiple Vitamin (MULTIVITAMIN) tablet Take 1 tablet by mouth every morning.     omeprazole (PRILOSEC OTC) 20 MG tablet Take 20 mg by mouth daily.     oxybutynin  (DITROPAN ) 5 MG tablet Take 5 mg by mouth 2 (two) times daily.     OXYGEN  Inhale 1 L into the lungs as needed (shortness of breath, O2 sats <88%).     polyethylene glycol (MIRALAX  / GLYCOLAX ) 17 g packet Take 17 g by mouth every morning.     potassium chloride  (KLOR-CON ) 20 MEQ packet Take 20 mEq by mouth daily.     senna-docusate (SENOKOT-S) 8.6-50 MG tablet Take 2 tablets by mouth at bedtime. 60 tablet 5   sodium chloride  (OCEAN) 0.65 % SOLN nasal spray Place 1 spray into both nostrils in the morning, at noon, in the evening, and at bedtime. For nasal dryness     Sodium Chloride  Flush (SALINE FLUSH IV) Irrigate with 5 mLs as directed 2 (two) times a week. Mondays and Thursdays     Sodium Chloride  Flush (SALINE FLUSH IV) Irrigate  with 5 mLs as directed every 8 (eight) hours as needed (irrigation).     thiamine  (VITAMIN B-1) 100 MG tablet Take 1 tablet (100 mg total) by mouth daily. 30 tablet 0   No current facility-administered medications for this visit.    PHYSICAL EXAM No exam performed.  Telephone encounter.  PERTINENT LABORATORY AND RADIOLOGIC DATA  Most recent CBC    Latest Ref Rng & Units 07/11/2024    4:00 AM 07/10/2024    4:07 AM 07/08/2024    3:17 AM  CBC  WBC 4.0 - 10.5 K/uL 18.2  17.7  13.7   Hemoglobin 13.0 - 17.0 g/dL 9.7  9.4  8.5   Hematocrit 39.0 - 52.0 % 31.9  31.6  27.8   Platelets 150 - 400 K/uL 338  351  278  Most recent CMP    Latest Ref Rng & Units 07/11/2024    4:00 AM 07/10/2024    4:07 AM 07/08/2024    3:17 AM  CMP  Glucose 70 - 99 mg/dL 887  872  814   BUN 8 - 23 mg/dL 75  89  71   Creatinine 0.61 - 1.24 mg/dL 8.03  7.64  7.51   Sodium 135 - 145 mmol/L 141  143  139   Potassium 3.5 - 5.1 mmol/L 4.6  4.6  3.9   Chloride 98 - 111 mmol/L 111  112  111   CO2 22 - 32 mmol/L 19  18  16    Calcium  8.9 - 10.3 mg/dL 8.8  9.3  9.2     Renal function Estimated Creatinine Clearance: 21.1 mL/min (A) (by C-G formula based on SCr of 1.96 mg/dL (H)).  Hgb A1c MFr Bld (%)  Date Value  09/30/2023 6.1 (H)    LDL Cholesterol  Date Value Ref Range Status  09/30/2023 16 0 - 99 mg/dL Final    Comment:           Total Cholesterol/HDL:CHD Risk Coronary Heart Disease Risk Table                     Men   Women  1/2 Average Risk   3.4   3.3  Average Risk       5.0   4.4  2 X Average Risk   9.6   7.1  3 X Average Risk  23.4   11.0        Use the calculated Patient Ratio above and the CHD Risk Table to determine the patient's CHD Risk.        ATP III CLASSIFICATION (LDL):  <100     mg/dL   Optimal  899-870  mg/dL   Near or Above                    Optimal  130-159  mg/dL   Borderline  839-810  mg/dL   High  >809     mg/dL   Very High Performed at [facility],  [address] [place], [place] [address]     CLINICAL DATA:  Left heel wound.  Right ankle wound.  EXAM: NONINVASIVE PHYSIOLOGIC VASCULAR STUDY OF BILATERAL LOWER EXTREMITIES  TECHNIQUE: Evaluation of both lower extremities were performed at rest, including calculation of ankle-brachial indices with single level Doppler, pressure and pulse volume recording.  COMPARISON:  None Available.  FINDINGS: Right ABI:  0.43  Left ABI:  0.47  Right Lower Extremity: Monophasic and biphasic waveforms at the right ankle.  Left Lower Extremity: Probable biphasic waveforms at the left ankle.  < 0.5 Severe PAD Procedure Note  Philip Juliene Motto, MD - 11/04/2023 Formatting of this note might be different from the original. CLINICAL DATA:  Left heel wound.  Right ankle wound.  EXAM: NONINVASIVE PHYSIOLOGIC VASCULAR STUDY OF BILATERAL LOWER EXTREMITIES  TECHNIQUE: Evaluation of both lower extremities were performed at rest, including calculation of ankle-brachial indices with single level Doppler, pressure and pulse volume recording.  COMPARISON:  None Available.  FINDINGS: Right ABI:  0.43  Left ABI:  0.47  Right Lower Extremity: Monophasic and biphasic waveforms at the right ankle.  Left Lower Extremity: Probable biphasic waveforms at the left ankle.  < 0.5 Severe PAD  IMPRESSION: Severe arterial disease in both lower extremities.   Electronically Signed   By: Juliene  Philip M.D.   On: 11/04/2023 13:02  Jordan SAILOR. Magda, MD FACS [facility] Phone Number: [phone] 07/19/2024 7:36 AM   Total time spent on preparing this encounter including chart review, data review, collecting history, examining the patient, and coordinating care: 60 min  Portions of this report may have been transcribed using voice recognition software.  Every effort has been made to ensure accuracy; however, inadvertent computerized transcription errors may still  be present.  I connected with the spouse of Jordan Ward on 07/19/24  and verified that I am speaking with the correct person using two identifiers.   I discussed the limitations of evaluation and management by telemedicine. The patient expressed understanding and agreed to proceed.

## 2024-07-20 DIAGNOSIS — R1312 Dysphagia, oropharyngeal phase: Secondary | ICD-10-CM | POA: Diagnosis not present

## 2024-07-20 DIAGNOSIS — G894 Chronic pain syndrome: Secondary | ICD-10-CM | POA: Diagnosis not present

## 2024-07-20 DIAGNOSIS — L89623 Pressure ulcer of left heel, stage 3: Secondary | ICD-10-CM | POA: Diagnosis not present

## 2024-07-20 DIAGNOSIS — R159 Full incontinence of feces: Secondary | ICD-10-CM | POA: Diagnosis not present

## 2024-07-20 DIAGNOSIS — M4628 Osteomyelitis of vertebra, sacral and sacrococcygeal region: Secondary | ICD-10-CM | POA: Diagnosis not present

## 2024-07-20 DIAGNOSIS — J690 Pneumonitis due to inhalation of food and vomit: Secondary | ICD-10-CM | POA: Diagnosis not present

## 2024-07-20 DIAGNOSIS — M6281 Muscle weakness (generalized): Secondary | ICD-10-CM | POA: Diagnosis not present

## 2024-07-20 DIAGNOSIS — L89154 Pressure ulcer of sacral region, stage 4: Secondary | ICD-10-CM | POA: Diagnosis not present

## 2024-07-20 DIAGNOSIS — G35D Multiple sclerosis, unspecified: Secondary | ICD-10-CM | POA: Diagnosis not present

## 2024-07-20 DIAGNOSIS — L89314 Pressure ulcer of right buttock, stage 4: Secondary | ICD-10-CM | POA: Diagnosis not present

## 2024-07-20 DIAGNOSIS — L89610 Pressure ulcer of right heel, unstageable: Secondary | ICD-10-CM | POA: Diagnosis not present

## 2024-07-20 DIAGNOSIS — R2689 Other abnormalities of gait and mobility: Secondary | ICD-10-CM | POA: Diagnosis not present

## 2024-07-20 DIAGNOSIS — N312 Flaccid neuropathic bladder, not elsewhere classified: Secondary | ICD-10-CM | POA: Diagnosis not present

## 2024-07-20 DIAGNOSIS — R278 Other lack of coordination: Secondary | ICD-10-CM | POA: Diagnosis not present

## 2024-07-20 DIAGNOSIS — E440 Moderate protein-calorie malnutrition: Secondary | ICD-10-CM | POA: Diagnosis not present

## 2024-07-21 DIAGNOSIS — R1312 Dysphagia, oropharyngeal phase: Secondary | ICD-10-CM | POA: Diagnosis not present

## 2024-07-21 DIAGNOSIS — R278 Other lack of coordination: Secondary | ICD-10-CM | POA: Diagnosis not present

## 2024-07-21 DIAGNOSIS — M6281 Muscle weakness (generalized): Secondary | ICD-10-CM | POA: Diagnosis not present

## 2024-07-21 DIAGNOSIS — E440 Moderate protein-calorie malnutrition: Secondary | ICD-10-CM | POA: Diagnosis not present

## 2024-07-21 DIAGNOSIS — R2689 Other abnormalities of gait and mobility: Secondary | ICD-10-CM | POA: Diagnosis not present

## 2024-07-22 DIAGNOSIS — M6281 Muscle weakness (generalized): Secondary | ICD-10-CM | POA: Diagnosis not present

## 2024-07-22 DIAGNOSIS — L03116 Cellulitis of left lower limb: Secondary | ICD-10-CM | POA: Diagnosis not present

## 2024-07-22 DIAGNOSIS — E440 Moderate protein-calorie malnutrition: Secondary | ICD-10-CM | POA: Diagnosis not present

## 2024-07-22 DIAGNOSIS — M4628 Osteomyelitis of vertebra, sacral and sacrococcygeal region: Secondary | ICD-10-CM | POA: Diagnosis not present

## 2024-07-22 DIAGNOSIS — R2689 Other abnormalities of gait and mobility: Secondary | ICD-10-CM | POA: Diagnosis not present

## 2024-07-22 DIAGNOSIS — L89154 Pressure ulcer of sacral region, stage 4: Secondary | ICD-10-CM | POA: Diagnosis not present

## 2024-07-22 DIAGNOSIS — J690 Pneumonitis due to inhalation of food and vomit: Secondary | ICD-10-CM | POA: Diagnosis not present

## 2024-07-22 DIAGNOSIS — R1312 Dysphagia, oropharyngeal phase: Secondary | ICD-10-CM | POA: Diagnosis not present

## 2024-07-22 DIAGNOSIS — R278 Other lack of coordination: Secondary | ICD-10-CM | POA: Diagnosis not present

## 2024-07-25 ENCOUNTER — Telehealth: Payer: Self-pay

## 2024-07-25 DIAGNOSIS — L89623 Pressure ulcer of left heel, stage 3: Secondary | ICD-10-CM | POA: Diagnosis not present

## 2024-07-25 DIAGNOSIS — L89314 Pressure ulcer of right buttock, stage 4: Secondary | ICD-10-CM | POA: Diagnosis not present

## 2024-07-25 DIAGNOSIS — R2689 Other abnormalities of gait and mobility: Secondary | ICD-10-CM | POA: Diagnosis not present

## 2024-07-25 DIAGNOSIS — E440 Moderate protein-calorie malnutrition: Secondary | ICD-10-CM | POA: Diagnosis not present

## 2024-07-25 DIAGNOSIS — I639 Cerebral infarction, unspecified: Secondary | ICD-10-CM | POA: Diagnosis not present

## 2024-07-25 DIAGNOSIS — L89154 Pressure ulcer of sacral region, stage 4: Secondary | ICD-10-CM | POA: Diagnosis not present

## 2024-07-25 DIAGNOSIS — M6281 Muscle weakness (generalized): Secondary | ICD-10-CM | POA: Diagnosis not present

## 2024-07-25 DIAGNOSIS — R278 Other lack of coordination: Secondary | ICD-10-CM | POA: Diagnosis not present

## 2024-07-25 DIAGNOSIS — I129 Hypertensive chronic kidney disease with stage 1 through stage 4 chronic kidney disease, or unspecified chronic kidney disease: Secondary | ICD-10-CM | POA: Diagnosis not present

## 2024-07-25 DIAGNOSIS — G35D Multiple sclerosis, unspecified: Secondary | ICD-10-CM | POA: Diagnosis not present

## 2024-07-25 DIAGNOSIS — R159 Full incontinence of feces: Secondary | ICD-10-CM | POA: Diagnosis not present

## 2024-07-25 DIAGNOSIS — R319 Hematuria, unspecified: Secondary | ICD-10-CM | POA: Diagnosis not present

## 2024-07-25 DIAGNOSIS — E876 Hypokalemia: Secondary | ICD-10-CM | POA: Diagnosis not present

## 2024-07-25 DIAGNOSIS — R1312 Dysphagia, oropharyngeal phase: Secondary | ICD-10-CM | POA: Diagnosis not present

## 2024-07-25 DIAGNOSIS — N1832 Chronic kidney disease, stage 3b: Secondary | ICD-10-CM | POA: Diagnosis not present

## 2024-07-25 DIAGNOSIS — L89610 Pressure ulcer of right heel, unstageable: Secondary | ICD-10-CM | POA: Diagnosis not present

## 2024-07-25 DIAGNOSIS — M4628 Osteomyelitis of vertebra, sacral and sacrococcygeal region: Secondary | ICD-10-CM | POA: Diagnosis not present

## 2024-07-25 DIAGNOSIS — G894 Chronic pain syndrome: Secondary | ICD-10-CM | POA: Diagnosis not present

## 2024-07-25 DIAGNOSIS — E785 Hyperlipidemia, unspecified: Secondary | ICD-10-CM | POA: Diagnosis not present

## 2024-07-25 NOTE — Telephone Encounter (Signed)
 Wife called to cancel appt for patient. Patient currently at [facility] facility.  Wife is unsure if they are able to change catheter but patient is also on bed rest per wife.  Wife would like to have a phone call with Dr. Matilda to discuss treatment for patients SP tube.

## 2024-07-26 ENCOUNTER — Ambulatory Visit: Admitting: Urology

## 2024-07-26 ENCOUNTER — Telehealth: Payer: Self-pay

## 2024-07-26 DIAGNOSIS — D631 Anemia in chronic kidney disease: Secondary | ICD-10-CM | POA: Diagnosis not present

## 2024-07-26 DIAGNOSIS — N1832 Chronic kidney disease, stage 3b: Secondary | ICD-10-CM | POA: Diagnosis not present

## 2024-07-26 DIAGNOSIS — M4628 Osteomyelitis of vertebra, sacral and sacrococcygeal region: Secondary | ICD-10-CM | POA: Diagnosis not present

## 2024-07-26 DIAGNOSIS — R278 Other lack of coordination: Secondary | ICD-10-CM | POA: Diagnosis not present

## 2024-07-26 DIAGNOSIS — Z1612 Extended spectrum beta lactamase (ESBL) resistance: Secondary | ICD-10-CM | POA: Diagnosis not present

## 2024-07-26 DIAGNOSIS — E440 Moderate protein-calorie malnutrition: Secondary | ICD-10-CM | POA: Diagnosis not present

## 2024-07-26 DIAGNOSIS — R2689 Other abnormalities of gait and mobility: Secondary | ICD-10-CM | POA: Diagnosis not present

## 2024-07-26 DIAGNOSIS — R1312 Dysphagia, oropharyngeal phase: Secondary | ICD-10-CM | POA: Diagnosis not present

## 2024-07-26 DIAGNOSIS — M6281 Muscle weakness (generalized): Secondary | ICD-10-CM | POA: Diagnosis not present

## 2024-07-26 DIAGNOSIS — I129 Hypertensive chronic kidney disease with stage 1 through stage 4 chronic kidney disease, or unspecified chronic kidney disease: Secondary | ICD-10-CM | POA: Diagnosis not present

## 2024-07-26 NOTE — Telephone Encounter (Signed)
 FYI and advise if needed

## 2024-07-26 NOTE — Telephone Encounter (Signed)
 Called pt wife to gather information for what pt needed pt wife stated pt has been in [facility] with a blood clot for about 2 weeks and has been unable to go to appts due to mandatory bed rest pt wife is asking when will pt need neph and SP tube change pt wife was advised that last SP tube change was 09/24 and pt is due for change pt wife stated last neph change was 09/24 and is scheduled for a change on 10/30 per verbal from MD Dahlstedt both caths need to be changed. Pt made aware and [facility]  was called to see if pt can be transported to needed appt. While speaking w/ LPN lanita) she stated pt is positive for DVT and cannot be transported but advised me to c/b in the morning to speak with DON (Whitney Blackstock) phone number [phone] ext. [phone] advised LPN that I would call tomorrow pt wife made aware of all details pt wife voiced her understanding and stated she will call IR to discuss needs for neph tubes

## 2024-07-26 NOTE — Telephone Encounter (Signed)
 See other encounter

## 2024-07-27 DIAGNOSIS — E440 Moderate protein-calorie malnutrition: Secondary | ICD-10-CM | POA: Diagnosis not present

## 2024-07-27 DIAGNOSIS — R2689 Other abnormalities of gait and mobility: Secondary | ICD-10-CM | POA: Diagnosis not present

## 2024-07-27 DIAGNOSIS — J690 Pneumonitis due to inhalation of food and vomit: Secondary | ICD-10-CM | POA: Diagnosis not present

## 2024-07-27 DIAGNOSIS — R1312 Dysphagia, oropharyngeal phase: Secondary | ICD-10-CM | POA: Diagnosis not present

## 2024-07-27 DIAGNOSIS — J440 Chronic obstructive pulmonary disease with acute lower respiratory infection: Secondary | ICD-10-CM | POA: Diagnosis not present

## 2024-07-27 DIAGNOSIS — J189 Pneumonia, unspecified organism: Secondary | ICD-10-CM | POA: Diagnosis not present

## 2024-07-27 DIAGNOSIS — M4628 Osteomyelitis of vertebra, sacral and sacrococcygeal region: Secondary | ICD-10-CM | POA: Diagnosis not present

## 2024-07-27 DIAGNOSIS — M6281 Muscle weakness (generalized): Secondary | ICD-10-CM | POA: Diagnosis not present

## 2024-07-27 DIAGNOSIS — R278 Other lack of coordination: Secondary | ICD-10-CM | POA: Diagnosis not present

## 2024-07-27 NOTE — Telephone Encounter (Signed)
 Called and lvm in regards to pt care advised facility to c/b

## 2024-07-28 ENCOUNTER — Inpatient Hospital Stay (HOSPITAL_COMMUNITY): Admission: RE | Admit: 2024-07-28 | Source: Ambulatory Visit

## 2024-08-02 ENCOUNTER — Ambulatory Visit: Admitting: Neurology

## 2024-08-03 DIAGNOSIS — Z1612 Extended spectrum beta lactamase (ESBL) resistance: Secondary | ICD-10-CM | POA: Diagnosis not present

## 2024-08-03 DIAGNOSIS — M4628 Osteomyelitis of vertebra, sacral and sacrococcygeal region: Secondary | ICD-10-CM | POA: Diagnosis not present

## 2024-08-03 DIAGNOSIS — R1312 Dysphagia, oropharyngeal phase: Secondary | ICD-10-CM | POA: Diagnosis not present

## 2024-08-03 DIAGNOSIS — J449 Chronic obstructive pulmonary disease, unspecified: Secondary | ICD-10-CM | POA: Diagnosis not present

## 2024-08-03 DIAGNOSIS — E440 Moderate protein-calorie malnutrition: Secondary | ICD-10-CM | POA: Diagnosis not present

## 2024-08-03 DIAGNOSIS — J8410 Pulmonary fibrosis, unspecified: Secondary | ICD-10-CM | POA: Diagnosis not present

## 2024-08-03 DIAGNOSIS — J440 Chronic obstructive pulmonary disease with acute lower respiratory infection: Secondary | ICD-10-CM | POA: Diagnosis not present

## 2024-08-03 DIAGNOSIS — L89620 Pressure ulcer of left heel, unstageable: Secondary | ICD-10-CM | POA: Diagnosis not present

## 2024-08-03 DIAGNOSIS — G35D Multiple sclerosis, unspecified: Secondary | ICD-10-CM | POA: Diagnosis not present

## 2024-08-03 DIAGNOSIS — J189 Pneumonia, unspecified organism: Secondary | ICD-10-CM | POA: Diagnosis not present

## 2024-08-03 DIAGNOSIS — M6281 Muscle weakness (generalized): Secondary | ICD-10-CM | POA: Diagnosis not present

## 2024-08-03 DIAGNOSIS — R278 Other lack of coordination: Secondary | ICD-10-CM | POA: Diagnosis not present

## 2024-08-03 DIAGNOSIS — J690 Pneumonitis due to inhalation of food and vomit: Secondary | ICD-10-CM | POA: Diagnosis not present

## 2024-08-03 DIAGNOSIS — I639 Cerebral infarction, unspecified: Secondary | ICD-10-CM | POA: Diagnosis not present

## 2024-08-03 DIAGNOSIS — L89510 Pressure ulcer of right ankle, unstageable: Secondary | ICD-10-CM | POA: Diagnosis not present

## 2024-08-03 DIAGNOSIS — L89154 Pressure ulcer of sacral region, stage 4: Secondary | ICD-10-CM | POA: Diagnosis not present

## 2024-08-04 DIAGNOSIS — R1312 Dysphagia, oropharyngeal phase: Secondary | ICD-10-CM | POA: Diagnosis not present

## 2024-08-08 DIAGNOSIS — M4628 Osteomyelitis of vertebra, sacral and sacrococcygeal region: Secondary | ICD-10-CM | POA: Diagnosis not present

## 2024-08-08 DIAGNOSIS — R1312 Dysphagia, oropharyngeal phase: Secondary | ICD-10-CM | POA: Diagnosis not present

## 2024-08-08 DIAGNOSIS — I69391 Dysphagia following cerebral infarction: Secondary | ICD-10-CM | POA: Diagnosis not present

## 2024-08-08 DIAGNOSIS — I129 Hypertensive chronic kidney disease with stage 1 through stage 4 chronic kidney disease, or unspecified chronic kidney disease: Secondary | ICD-10-CM | POA: Diagnosis not present

## 2024-08-09 DIAGNOSIS — R1312 Dysphagia, oropharyngeal phase: Secondary | ICD-10-CM | POA: Diagnosis not present

## 2024-08-09 DIAGNOSIS — F419 Anxiety disorder, unspecified: Secondary | ICD-10-CM | POA: Diagnosis not present

## 2024-08-11 DIAGNOSIS — R1312 Dysphagia, oropharyngeal phase: Secondary | ICD-10-CM | POA: Diagnosis not present

## 2024-08-12 DIAGNOSIS — N1832 Chronic kidney disease, stage 3b: Secondary | ICD-10-CM | POA: Diagnosis not present

## 2024-08-12 DIAGNOSIS — M4628 Osteomyelitis of vertebra, sacral and sacrococcygeal region: Secondary | ICD-10-CM | POA: Diagnosis not present

## 2024-08-12 DIAGNOSIS — I129 Hypertensive chronic kidney disease with stage 1 through stage 4 chronic kidney disease, or unspecified chronic kidney disease: Secondary | ICD-10-CM | POA: Diagnosis not present

## 2024-08-12 DIAGNOSIS — R319 Hematuria, unspecified: Secondary | ICD-10-CM | POA: Diagnosis not present

## 2024-08-15 DIAGNOSIS — R1312 Dysphagia, oropharyngeal phase: Secondary | ICD-10-CM | POA: Diagnosis not present

## 2024-08-15 DIAGNOSIS — G35D Multiple sclerosis, unspecified: Secondary | ICD-10-CM | POA: Diagnosis not present

## 2024-08-15 DIAGNOSIS — J449 Chronic obstructive pulmonary disease, unspecified: Secondary | ICD-10-CM | POA: Diagnosis not present

## 2024-08-15 DIAGNOSIS — L89510 Pressure ulcer of right ankle, unstageable: Secondary | ICD-10-CM | POA: Diagnosis not present

## 2024-08-15 DIAGNOSIS — J8410 Pulmonary fibrosis, unspecified: Secondary | ICD-10-CM | POA: Diagnosis not present

## 2024-08-15 DIAGNOSIS — M6281 Muscle weakness (generalized): Secondary | ICD-10-CM | POA: Diagnosis not present

## 2024-08-15 DIAGNOSIS — I639 Cerebral infarction, unspecified: Secondary | ICD-10-CM | POA: Diagnosis not present

## 2024-08-15 DIAGNOSIS — E440 Moderate protein-calorie malnutrition: Secondary | ICD-10-CM | POA: Diagnosis not present

## 2024-08-15 DIAGNOSIS — L89620 Pressure ulcer of left heel, unstageable: Secondary | ICD-10-CM | POA: Diagnosis not present

## 2024-08-15 DIAGNOSIS — R278 Other lack of coordination: Secondary | ICD-10-CM | POA: Diagnosis not present

## 2024-08-15 DIAGNOSIS — Z1612 Extended spectrum beta lactamase (ESBL) resistance: Secondary | ICD-10-CM | POA: Diagnosis not present

## 2024-08-15 DIAGNOSIS — L89154 Pressure ulcer of sacral region, stage 4: Secondary | ICD-10-CM | POA: Diagnosis not present

## 2024-08-16 ENCOUNTER — Inpatient Hospital Stay (HOSPITAL_COMMUNITY): Admission: RE | Admit: 2024-08-16 | Source: Ambulatory Visit

## 2024-08-16 ENCOUNTER — Ambulatory Visit: Admitting: Vascular Surgery

## 2024-08-17 DIAGNOSIS — M4628 Osteomyelitis of vertebra, sacral and sacrococcygeal region: Secondary | ICD-10-CM | POA: Diagnosis not present

## 2024-08-17 DIAGNOSIS — I129 Hypertensive chronic kidney disease with stage 1 through stage 4 chronic kidney disease, or unspecified chronic kidney disease: Secondary | ICD-10-CM | POA: Diagnosis not present

## 2024-08-17 DIAGNOSIS — J8410 Pulmonary fibrosis, unspecified: Secondary | ICD-10-CM | POA: Diagnosis not present

## 2024-08-17 DIAGNOSIS — E440 Moderate protein-calorie malnutrition: Secondary | ICD-10-CM | POA: Diagnosis not present

## 2024-08-18 DIAGNOSIS — R1312 Dysphagia, oropharyngeal phase: Secondary | ICD-10-CM | POA: Diagnosis not present

## 2024-08-19 DIAGNOSIS — R1312 Dysphagia, oropharyngeal phase: Secondary | ICD-10-CM | POA: Diagnosis not present

## 2024-08-19 DIAGNOSIS — R319 Hematuria, unspecified: Secondary | ICD-10-CM | POA: Diagnosis not present

## 2024-08-19 DIAGNOSIS — N1832 Chronic kidney disease, stage 3b: Secondary | ICD-10-CM | POA: Diagnosis not present

## 2024-08-19 DIAGNOSIS — I129 Hypertensive chronic kidney disease with stage 1 through stage 4 chronic kidney disease, or unspecified chronic kidney disease: Secondary | ICD-10-CM | POA: Diagnosis not present

## 2024-08-19 DIAGNOSIS — M4628 Osteomyelitis of vertebra, sacral and sacrococcygeal region: Secondary | ICD-10-CM | POA: Diagnosis not present

## 2024-08-22 DIAGNOSIS — R319 Hematuria, unspecified: Secondary | ICD-10-CM | POA: Diagnosis not present

## 2024-08-22 DIAGNOSIS — N1832 Chronic kidney disease, stage 3b: Secondary | ICD-10-CM | POA: Diagnosis not present

## 2024-08-22 DIAGNOSIS — I129 Hypertensive chronic kidney disease with stage 1 through stage 4 chronic kidney disease, or unspecified chronic kidney disease: Secondary | ICD-10-CM | POA: Diagnosis not present

## 2024-08-22 DIAGNOSIS — M4628 Osteomyelitis of vertebra, sacral and sacrococcygeal region: Secondary | ICD-10-CM | POA: Diagnosis not present

## 2024-08-22 DIAGNOSIS — R1312 Dysphagia, oropharyngeal phase: Secondary | ICD-10-CM | POA: Diagnosis not present

## 2024-08-23 DIAGNOSIS — R1312 Dysphagia, oropharyngeal phase: Secondary | ICD-10-CM | POA: Diagnosis not present

## 2024-08-23 DIAGNOSIS — R197 Diarrhea, unspecified: Secondary | ICD-10-CM | POA: Diagnosis not present

## 2024-08-24 DIAGNOSIS — R1312 Dysphagia, oropharyngeal phase: Secondary | ICD-10-CM | POA: Diagnosis not present

## 2024-08-25 DIAGNOSIS — R1312 Dysphagia, oropharyngeal phase: Secondary | ICD-10-CM | POA: Diagnosis not present

## 2024-08-26 DIAGNOSIS — A0472 Enterocolitis due to Clostridium difficile, not specified as recurrent: Secondary | ICD-10-CM | POA: Diagnosis not present

## 2024-08-26 DIAGNOSIS — M4628 Osteomyelitis of vertebra, sacral and sacrococcygeal region: Secondary | ICD-10-CM | POA: Diagnosis not present

## 2024-08-26 DIAGNOSIS — J9601 Acute respiratory failure with hypoxia: Secondary | ICD-10-CM | POA: Diagnosis not present

## 2024-08-30 ENCOUNTER — Other Ambulatory Visit (HOSPITAL_COMMUNITY)

## 2024-09-06 DIAGNOSIS — E440 Moderate protein-calorie malnutrition: Secondary | ICD-10-CM | POA: Diagnosis not present

## 2024-09-06 DIAGNOSIS — M4628 Osteomyelitis of vertebra, sacral and sacrococcygeal region: Secondary | ICD-10-CM | POA: Diagnosis not present

## 2024-09-06 DIAGNOSIS — A0472 Enterocolitis due to Clostridium difficile, not specified as recurrent: Secondary | ICD-10-CM | POA: Diagnosis not present

## 2024-09-06 DIAGNOSIS — I129 Hypertensive chronic kidney disease with stage 1 through stage 4 chronic kidney disease, or unspecified chronic kidney disease: Secondary | ICD-10-CM | POA: Diagnosis not present

## 2024-09-08 ENCOUNTER — Other Ambulatory Visit (HOSPITAL_COMMUNITY): Payer: Self-pay | Admitting: Radiology

## 2024-09-08 ENCOUNTER — Inpatient Hospital Stay (HOSPITAL_COMMUNITY)
Admission: RE | Admit: 2024-09-08 | Discharge: 2024-09-08 | Attending: Diagnostic Radiology | Admitting: Diagnostic Radiology

## 2024-09-08 DIAGNOSIS — Z436 Encounter for attention to other artificial openings of urinary tract: Secondary | ICD-10-CM | POA: Diagnosis present

## 2024-09-08 DIAGNOSIS — N319 Neuromuscular dysfunction of bladder, unspecified: Secondary | ICD-10-CM

## 2024-09-08 HISTORY — PX: IR NEPHROSTOMY EXCHANGE LEFT: IMG6069

## 2024-09-08 MED ORDER — IOHEXOL 300 MG/ML  SOLN
50.0000 mL | Freq: Once | INTRAMUSCULAR | Status: AC | PRN
  Administered 2024-09-08: 10 mL

## 2024-09-08 MED ORDER — LIDOCAINE HCL 1 % IJ SOLN
20.0000 mL | Freq: Once | INTRAMUSCULAR | Status: AC
  Administered 2024-09-08: 2 mL via INTRADERMAL

## 2024-09-08 MED ORDER — LIDOCAINE HCL 1 % IJ SOLN
INTRAMUSCULAR | Status: AC
  Filled 2024-09-08: qty 20

## 2024-09-08 NOTE — Procedures (Signed)
 Interventional Radiology Procedure:   Indications: Chronic left nephrostomy tube   Procedure: Left nephrostomy tube exchange  Findings: New 10 Fr tube in left renal pelvis   Complications: None     EBL: Minimal  Plan:  Continue routine exchanges   Kohen Reither R. Philip, MD  Pager: [phone]

## 2024-09-17 ENCOUNTER — Inpatient Hospital Stay (HOSPITAL_COMMUNITY)
Admission: EM | Admit: 2024-09-17 | Discharge: 2024-09-20 | DRG: 698 | Disposition: A | Discharge status: Skilled Nursing Facility | Source: Skilled Nursing Facility | Attending: Internal Medicine | Admitting: Internal Medicine

## 2024-09-17 ENCOUNTER — Other Ambulatory Visit: Payer: Self-pay

## 2024-09-17 ENCOUNTER — Emergency Department (HOSPITAL_COMMUNITY)

## 2024-09-17 DIAGNOSIS — Z515 Encounter for palliative care: Secondary | ICD-10-CM

## 2024-09-17 DIAGNOSIS — L89314 Pressure ulcer of right buttock, stage 4: Secondary | ICD-10-CM | POA: Diagnosis present

## 2024-09-17 DIAGNOSIS — J439 Emphysema, unspecified: Secondary | ICD-10-CM | POA: Diagnosis present

## 2024-09-17 DIAGNOSIS — I679 Cerebrovascular disease, unspecified: Secondary | ICD-10-CM | POA: Diagnosis present

## 2024-09-17 DIAGNOSIS — J8410 Pulmonary fibrosis, unspecified: Secondary | ICD-10-CM | POA: Diagnosis present

## 2024-09-17 DIAGNOSIS — I69354 Hemiplegia and hemiparesis following cerebral infarction affecting left non-dominant side: Secondary | ICD-10-CM | POA: Diagnosis not present

## 2024-09-17 DIAGNOSIS — Z79890 Hormone replacement therapy (postmenopausal): Secondary | ICD-10-CM

## 2024-09-17 DIAGNOSIS — Y846 Urinary catheterization as the cause of abnormal reaction of the patient, or of later complication, without mention of misadventure at the time of the procedure: Secondary | ICD-10-CM | POA: Diagnosis present

## 2024-09-17 DIAGNOSIS — J440 Chronic obstructive pulmonary disease with acute lower respiratory infection: Secondary | ICD-10-CM | POA: Diagnosis present

## 2024-09-17 DIAGNOSIS — A419 Sepsis, unspecified organism: Principal | ICD-10-CM

## 2024-09-17 DIAGNOSIS — E860 Dehydration: Secondary | ICD-10-CM | POA: Diagnosis present

## 2024-09-17 DIAGNOSIS — N319 Neuromuscular dysfunction of bladder, unspecified: Secondary | ICD-10-CM | POA: Diagnosis present

## 2024-09-17 DIAGNOSIS — E872 Acidosis, unspecified: Secondary | ICD-10-CM | POA: Diagnosis present

## 2024-09-17 DIAGNOSIS — Z87891 Personal history of nicotine dependence: Secondary | ICD-10-CM

## 2024-09-17 DIAGNOSIS — J449 Chronic obstructive pulmonary disease, unspecified: Secondary | ICD-10-CM | POA: Diagnosis not present

## 2024-09-17 DIAGNOSIS — Z881 Allergy status to other antibiotic agents status: Secondary | ICD-10-CM

## 2024-09-17 DIAGNOSIS — I2510 Atherosclerotic heart disease of native coronary artery without angina pectoris: Secondary | ICD-10-CM | POA: Diagnosis present

## 2024-09-17 DIAGNOSIS — L89323 Pressure ulcer of left buttock, stage 3: Secondary | ICD-10-CM | POA: Diagnosis present

## 2024-09-17 DIAGNOSIS — R627 Adult failure to thrive: Secondary | ICD-10-CM | POA: Diagnosis present

## 2024-09-17 DIAGNOSIS — N184 Chronic kidney disease, stage 4 (severe): Secondary | ICD-10-CM | POA: Diagnosis present

## 2024-09-17 DIAGNOSIS — Z961 Presence of intraocular lens: Secondary | ICD-10-CM | POA: Diagnosis present

## 2024-09-17 DIAGNOSIS — L8990 Pressure ulcer of unspecified site, unspecified stage: Secondary | ICD-10-CM | POA: Diagnosis not present

## 2024-09-17 DIAGNOSIS — Z9359 Other cystostomy status: Secondary | ICD-10-CM

## 2024-09-17 DIAGNOSIS — R1310 Dysphagia, unspecified: Secondary | ICD-10-CM | POA: Diagnosis present

## 2024-09-17 DIAGNOSIS — I739 Peripheral vascular disease, unspecified: Secondary | ICD-10-CM | POA: Diagnosis present

## 2024-09-17 DIAGNOSIS — J189 Pneumonia, unspecified organism: Secondary | ICD-10-CM | POA: Diagnosis present

## 2024-09-17 DIAGNOSIS — E785 Hyperlipidemia, unspecified: Secondary | ICD-10-CM | POA: Diagnosis present

## 2024-09-17 DIAGNOSIS — J9611 Chronic respiratory failure with hypoxia: Secondary | ICD-10-CM | POA: Diagnosis present

## 2024-09-17 DIAGNOSIS — I130 Hypertensive heart and chronic kidney disease with heart failure and stage 1 through stage 4 chronic kidney disease, or unspecified chronic kidney disease: Secondary | ICD-10-CM | POA: Diagnosis present

## 2024-09-17 DIAGNOSIS — Z7951 Long term (current) use of inhaled steroids: Secondary | ICD-10-CM | POA: Diagnosis not present

## 2024-09-17 DIAGNOSIS — G4733 Obstructive sleep apnea (adult) (pediatric): Secondary | ICD-10-CM | POA: Diagnosis present

## 2024-09-17 DIAGNOSIS — L89620 Pressure ulcer of left heel, unstageable: Secondary | ICD-10-CM | POA: Diagnosis present

## 2024-09-17 DIAGNOSIS — E039 Hypothyroidism, unspecified: Secondary | ICD-10-CM | POA: Diagnosis present

## 2024-09-17 DIAGNOSIS — Z8249 Family history of ischemic heart disease and other diseases of the circulatory system: Secondary | ICD-10-CM

## 2024-09-17 DIAGNOSIS — L89154 Pressure ulcer of sacral region, stage 4: Secondary | ICD-10-CM | POA: Diagnosis present

## 2024-09-17 DIAGNOSIS — Z9842 Cataract extraction status, left eye: Secondary | ICD-10-CM

## 2024-09-17 DIAGNOSIS — Z79899 Other long term (current) drug therapy: Secondary | ICD-10-CM | POA: Diagnosis not present

## 2024-09-17 DIAGNOSIS — R54 Age-related physical debility: Secondary | ICD-10-CM | POA: Diagnosis present

## 2024-09-17 DIAGNOSIS — Z9889 Other specified postprocedural states: Secondary | ICD-10-CM

## 2024-09-17 DIAGNOSIS — T83518A Infection and inflammatory reaction due to other urinary catheter, initial encounter: Secondary | ICD-10-CM | POA: Diagnosis present

## 2024-09-17 DIAGNOSIS — I5032 Chronic diastolic (congestive) heart failure: Secondary | ICD-10-CM | POA: Diagnosis present

## 2024-09-17 DIAGNOSIS — K219 Gastro-esophageal reflux disease without esophagitis: Secondary | ICD-10-CM | POA: Diagnosis present

## 2024-09-17 DIAGNOSIS — Z9981 Dependence on supplemental oxygen: Secondary | ICD-10-CM

## 2024-09-17 DIAGNOSIS — N139 Obstructive and reflux uropathy, unspecified: Secondary | ICD-10-CM | POA: Diagnosis present

## 2024-09-17 DIAGNOSIS — Z820 Family history of epilepsy and other diseases of the nervous system: Secondary | ICD-10-CM

## 2024-09-17 DIAGNOSIS — Z7982 Long term (current) use of aspirin: Secondary | ICD-10-CM | POA: Diagnosis not present

## 2024-09-17 DIAGNOSIS — I5042 Chronic combined systolic (congestive) and diastolic (congestive) heart failure: Secondary | ICD-10-CM | POA: Diagnosis present

## 2024-09-17 DIAGNOSIS — Z9049 Acquired absence of other specified parts of digestive tract: Secondary | ICD-10-CM

## 2024-09-17 DIAGNOSIS — E46 Unspecified protein-calorie malnutrition: Secondary | ICD-10-CM | POA: Diagnosis present

## 2024-09-17 DIAGNOSIS — Z936 Other artificial openings of urinary tract status: Secondary | ICD-10-CM

## 2024-09-17 DIAGNOSIS — Z8673 Personal history of transient ischemic attack (TIA), and cerebral infarction without residual deficits: Secondary | ICD-10-CM | POA: Diagnosis not present

## 2024-09-17 DIAGNOSIS — N390 Urinary tract infection, site not specified: Secondary | ICD-10-CM | POA: Diagnosis present

## 2024-09-17 DIAGNOSIS — Z9841 Cataract extraction status, right eye: Secondary | ICD-10-CM

## 2024-09-17 DIAGNOSIS — Z681 Body mass index (BMI) 19 or less, adult: Secondary | ICD-10-CM | POA: Diagnosis not present

## 2024-09-17 DIAGNOSIS — I10 Essential (primary) hypertension: Secondary | ICD-10-CM | POA: Diagnosis present

## 2024-09-17 DIAGNOSIS — Z87442 Personal history of urinary calculi: Secondary | ICD-10-CM

## 2024-09-17 DIAGNOSIS — G35D Multiple sclerosis, unspecified: Secondary | ICD-10-CM | POA: Diagnosis not present

## 2024-09-17 DIAGNOSIS — Z860100 Personal history of colon polyps, unspecified: Secondary | ICD-10-CM

## 2024-09-17 DIAGNOSIS — Z823 Family history of stroke: Secondary | ICD-10-CM

## 2024-09-17 DIAGNOSIS — R0902 Hypoxemia: Secondary | ICD-10-CM | POA: Diagnosis present

## 2024-09-17 LAB — CBC WITH DIFFERENTIAL/PLATELET
Abs Immature Granulocytes: 0.03 K/uL (ref 0.00–0.07)
Basophils Absolute: 0.1 K/uL (ref 0.0–0.1)
Basophils Relative: 1 %
Eosinophils Absolute: 0.3 K/uL (ref 0.0–0.5)
Eosinophils Relative: 3 %
HCT: 32.5 % — ABNORMAL LOW (ref 39.0–52.0)
Hemoglobin: 9.6 g/dL — ABNORMAL LOW (ref 13.0–17.0)
Immature Granulocytes: 0 %
Lymphocytes Relative: 26 %
Lymphs Abs: 2.6 K/uL (ref 0.7–4.0)
MCH: 28.1 pg (ref 26.0–34.0)
MCHC: 29.5 g/dL — ABNORMAL LOW (ref 30.0–36.0)
MCV: 95 fL (ref 80.0–100.0)
Monocytes Absolute: 0.6 K/uL (ref 0.1–1.0)
Monocytes Relative: 6 %
Neutro Abs: 6.2 K/uL (ref 1.7–7.7)
Neutrophils Relative %: 64 %
Platelets: 357 K/uL (ref 150–400)
RBC: 3.42 MIL/uL — ABNORMAL LOW (ref 4.22–5.81)
RDW: 18.9 % — ABNORMAL HIGH (ref 11.5–15.5)
WBC: 9.7 K/uL (ref 4.0–10.5)
nRBC: 0 % (ref 0.0–0.2)

## 2024-09-17 LAB — PROTIME-INR
INR: 1 (ref 0.8–1.2)
Prothrombin Time: 14.1 s (ref 11.4–15.2)

## 2024-09-17 LAB — COMPREHENSIVE METABOLIC PANEL WITH GFR
ALT: <5 U/L (ref 0–44)
AST: 16 U/L (ref 15–41)
Albumin: 3 g/dL — ABNORMAL LOW (ref 3.5–5.0)
Alkaline Phosphatase: 137 U/L — ABNORMAL HIGH (ref 38–126)
Anion gap: 14 (ref 5–15)
BUN: 29 mg/dL — ABNORMAL HIGH (ref 8–23)
CO2: 20 mmol/L — ABNORMAL LOW (ref 22–32)
Calcium: 8.5 mg/dL — ABNORMAL LOW (ref 8.9–10.3)
Chloride: 104 mmol/L (ref 98–111)
Creatinine, Ser: 2.22 mg/dL — ABNORMAL HIGH (ref 0.61–1.24)
GFR, Estimated: 29 mL/min — ABNORMAL LOW
Glucose, Bld: 118 mg/dL — ABNORMAL HIGH (ref 70–99)
Potassium: 5.1 mmol/L (ref 3.5–5.1)
Sodium: 138 mmol/L (ref 135–145)
Total Bilirubin: 0.3 mg/dL (ref 0.0–1.2)
Total Protein: 7.2 g/dL (ref 6.5–8.1)

## 2024-09-17 LAB — LACTIC ACID, PLASMA
Lactic Acid, Venous: 3.8 mmol/L (ref 0.5–1.9)
Lactic Acid, Venous: 2 mmol/L (ref 0.5–1.9)

## 2024-09-17 MED ORDER — METRONIDAZOLE 500 MG/100ML IV SOLN
500.0000 mg | Freq: Once | INTRAVENOUS | Status: AC
  Administered 2024-09-17: 500 mg via INTRAVENOUS
  Filled 2024-09-17: qty 100

## 2024-09-17 MED ORDER — ALBUTEROL SULFATE (2.5 MG/3ML) 0.083% IN NEBU: 2.5000 mg | INHALATION_SOLUTION | RESPIRATORY_TRACT | Status: DC | PRN

## 2024-09-17 MED ORDER — SODIUM CHLORIDE 0.9 % IV SOLN
2.0000 g | Freq: Once | INTRAVENOUS | Status: AC
  Administered 2024-09-17: 2 g via INTRAVENOUS
  Filled 2024-09-17: qty 12.5

## 2024-09-17 MED ORDER — POLYETHYLENE GLYCOL 3350 17 G PO PACK
17.0000 g | PACK | Freq: Every morning | ORAL | Status: DC
  Administered 2024-09-18 – 2024-09-20 (×3): 17 g via ORAL
  Filled 2024-09-17 (×3): qty 1

## 2024-09-17 MED ORDER — BUSPIRONE HCL 5 MG PO TABS
5.0000 mg | ORAL_TABLET | Freq: Three times a day (TID) | ORAL | Status: DC
  Administered 2024-09-17 – 2024-09-20 (×8): 5 mg via ORAL
  Filled 2024-09-17 (×9): qty 1

## 2024-09-17 MED ORDER — SODIUM CHLORIDE 0.9 % IV SOLN
2.0000 g | INTRAVENOUS | Status: DC
  Administered 2024-09-17 – 2024-09-19 (×3): 2 g via INTRAVENOUS
  Filled 2024-09-17 (×3): qty 20

## 2024-09-17 MED ORDER — ADULT MULTIVITAMIN W/MINERALS CH
1.0000 | ORAL_TABLET | Freq: Every morning | ORAL | Status: DC
  Administered 2024-09-18 – 2024-09-20 (×3): 1 via ORAL
  Filled 2024-09-17 (×3): qty 1

## 2024-09-17 MED ORDER — ACETAMINOPHEN 650 MG RE SUPP: 650.0000 mg | Freq: Four times a day (QID) | RECTAL | Status: DC | PRN

## 2024-09-17 MED ORDER — METHOCARBAMOL 500 MG PO TABS: 500.0000 mg | ORAL_TABLET | Freq: Four times a day (QID) | ORAL | Status: DC | PRN

## 2024-09-17 MED ORDER — ACETAMINOPHEN 325 MG PO TABS: 650.0000 mg | ORAL_TABLET | Freq: Four times a day (QID) | ORAL | Status: DC | PRN

## 2024-09-17 MED ORDER — MIRTAZAPINE 15 MG PO TABS
15.0000 mg | ORAL_TABLET | Freq: Every day | ORAL | Status: DC
  Administered 2024-09-17 – 2024-09-19 (×3): 15 mg via ORAL
  Filled 2024-09-17 (×3): qty 1

## 2024-09-17 MED ORDER — SODIUM CHLORIDE 0.9 % IV BOLUS (SEPSIS)
1000.0000 mL | Freq: Once | INTRAVENOUS | Status: AC
  Administered 2024-09-17: 1000 mL via INTRAVENOUS

## 2024-09-17 MED ORDER — DIAZEPAM 2 MG PO TABS
2.0000 mg | ORAL_TABLET | Freq: Two times a day (BID) | ORAL | Status: DC
  Administered 2024-09-17 – 2024-09-20 (×6): 2 mg via ORAL
  Filled 2024-09-17 (×6): qty 1

## 2024-09-17 MED ORDER — PANTOPRAZOLE SODIUM 40 MG PO TBEC
40.0000 mg | DELAYED_RELEASE_TABLET | Freq: Every day | ORAL | Status: DC
  Administered 2024-09-18 – 2024-09-20 (×3): 40 mg via ORAL
  Filled 2024-09-17 (×3): qty 1

## 2024-09-17 MED ORDER — ASPIRIN 81 MG PO CHEW
81.0000 mg | CHEWABLE_TABLET | Freq: Every day | ORAL | Status: DC
  Administered 2024-09-18 – 2024-09-20 (×3): 81 mg via ORAL
  Filled 2024-09-17 (×3): qty 1

## 2024-09-17 MED ORDER — SENNOSIDES-DOCUSATE SODIUM 8.6-50 MG PO TABS
2.0000 | ORAL_TABLET | Freq: Every day | ORAL | Status: DC
  Administered 2024-09-17 – 2024-09-19 (×2): 2 via ORAL
  Filled 2024-09-17 (×2): qty 2

## 2024-09-17 MED ORDER — HYDROCODONE-ACETAMINOPHEN 5-325 MG PO TABS
1.0000 | ORAL_TABLET | Freq: Three times a day (TID) | ORAL | Status: DC | PRN
  Administered 2024-09-17 – 2024-09-20 (×5): 1 via ORAL
  Filled 2024-09-17 (×6): qty 1

## 2024-09-17 MED ORDER — HEPARIN SODIUM (PORCINE) 5000 UNIT/ML IJ SOLN
5000.0000 [IU] | Freq: Three times a day (TID) | INTRAMUSCULAR | Status: DC
  Administered 2024-09-18 – 2024-09-20 (×7): 5000 [IU] via SUBCUTANEOUS
  Filled 2024-09-17 (×7): qty 1

## 2024-09-17 MED ORDER — MORPHINE SULFATE (PF) 4 MG/ML IV SOLN
4.0000 mg | Freq: Once | INTRAVENOUS | Status: AC
  Administered 2024-09-17: 4 mg via INTRAVENOUS
  Filled 2024-09-17: qty 1

## 2024-09-17 MED ORDER — PROSOURCE PLUS PO LIQD
30.0000 mL | Freq: Two times a day (BID) | ORAL | Status: DC
  Administered 2024-09-17 – 2024-09-20 (×5): 30 mL via ORAL
  Filled 2024-09-17 (×8): qty 30

## 2024-09-17 MED ORDER — VANCOMYCIN HCL IN DEXTROSE 1-5 GM/200ML-% IV SOLN
1000.0000 mg | Freq: Once | INTRAVENOUS | Status: AC
  Administered 2024-09-17: 1000 mg via INTRAVENOUS
  Filled 2024-09-17: qty 200

## 2024-09-17 MED ORDER — VITAMIN C 500 MG PO TABS
500.0000 mg | ORAL_TABLET | Freq: Every morning | ORAL | Status: DC
  Administered 2024-09-18 – 2024-09-20 (×3): 500 mg via ORAL
  Filled 2024-09-17 (×3): qty 1

## 2024-09-17 MED ORDER — OXYBUTYNIN CHLORIDE 5 MG PO TABS
5.0000 mg | ORAL_TABLET | Freq: Two times a day (BID) | ORAL | Status: DC
  Administered 2024-09-17 – 2024-09-20 (×6): 5 mg via ORAL
  Filled 2024-09-17 (×6): qty 1

## 2024-09-17 MED ORDER — LEVOTHYROXINE SODIUM 75 MCG PO TABS
75.0000 ug | ORAL_TABLET | Freq: Every day | ORAL | Status: DC
  Administered 2024-09-18 – 2024-09-20 (×3): 75 ug via ORAL
  Filled 2024-09-17 (×3): qty 1

## 2024-09-17 MED ORDER — THIAMINE MONONITRATE 100 MG PO TABS
100.0000 mg | ORAL_TABLET | Freq: Every day | ORAL | Status: DC
  Administered 2024-09-18 – 2024-09-20 (×3): 100 mg via ORAL
  Filled 2024-09-17 (×3): qty 1

## 2024-09-17 MED ORDER — MELATONIN 3 MG PO TABS
3.0000 mg | ORAL_TABLET | Freq: Every day | ORAL | Status: DC
  Administered 2024-09-17 – 2024-09-19 (×3): 3 mg via ORAL
  Filled 2024-09-17 (×3): qty 1

## 2024-09-17 MED ORDER — ONDANSETRON HCL 4 MG/2ML IJ SOLN
4.0000 mg | Freq: Once | INTRAMUSCULAR | Status: AC
  Administered 2024-09-17: 4 mg via INTRAVENOUS
  Filled 2024-09-17: qty 2

## 2024-09-17 MED ORDER — SODIUM CHLORIDE 0.9 % IV SOLN
500.0000 mg | INTRAVENOUS | Status: DC
  Administered 2024-09-18 – 2024-09-20 (×3): 500 mg via INTRAVENOUS
  Filled 2024-09-17 (×3): qty 5

## 2024-09-17 MED ORDER — SODIUM CHLORIDE 0.9 % IV SOLN: INTRAVENOUS | Status: AC

## 2024-09-17 NOTE — Plan of Care (Signed)

## 2024-09-17 NOTE — H&P (Signed)
 "                                                                                                          TRH H&P   Patient Demographics:    Jordan Ward, is a 83 y.o. male  MRN: [id number]   DOB - 1940/10/03  Admit Date - 09/17/2024  Outpatient Primary MD for the patient is Shona Norleen PEDLAR, MD    No chief complaint on file.     HPI:    Jordan Ward  is a 83 y.o. male, with medical history significant of pulmonary fibrosis/COPD, CAD, prior stroke, multiple sclerosis with neurogenic bladder, has had a longstanding suprapubic tube with atrophic right kidney and a left percutaneous nephrostomy tube, hypothyroidism, pressure ulcers, pneumonia, long-term resident at [facility].  Patient with PAD, chronic heel wounds (not a candidate for vascular intervention as he would need an aortobifemoral bypass to treat his aortoiliac occlusion). Vascular recommended transition to hospice.   - Patient was sent by his facility, as he was thought to be dehydrated and they could not get an IV access on him to initiate IV fluids, patient with poor appetite, poor oral intake over the last few days, where they thought to initiate IV fluids on him but could not get an IV access so he was sent to ED for further evaluation, patient reports poor appetite, he is on mirtazapine , reviewing records his nephrostomy exchange by IR 09/08/2024, but it does appear suprapubic catheter has not been changed in several months . and was changed by ED physician.   - In ED workup significant for potassium 5.1, creatinine of 2.22, at his baseline, lactic acid elevated at 3.8, white blood cell count within normal limit, was noted to have sacral pressure ulcer, does not appear to be infected, as well as left heel ulcer, UA still pending, chest x-ray with questionable left lung opacity.    Review of systems:     A full 10 point Review of Systems was done, except as stated above, all other Review of Systems were negative.   With Past  History of the following :    Past Medical History:  Diagnosis Date   Anemia    Arthritis    Back pain, chronic    Bilateral carotid bruits    C. difficile colitis 09/2011   CAD (coronary artery disease)    Carotid artery stenosis    Cerebrovascular disease    Cerebrovascular disease 08/17/2018   Colon polyps    Complication of cystostomy catheter, initial encounter 04/20/2020   Dyslipidemia    Dysphagia 10/07/2011   Encephalopathy    Gait disorder    HA (headache)    High grade dysplasia in colonic adenoma 09/2005   History of kidney stones    HTN (hypertension), malignant 10/06/2011   Hypernatremia    Hypokalemia    Hypothyroidism 10/08/2011   Insomnia    Junctional rhythm    Kidney stones    MS (multiple sclerosis)    Nausea and vomiting 03/02/2020   Neuromuscular disorder (HCC)  MS   OSA (obstructive sleep apnea)    Paroxysmal atrial tachycardia    Peripheral vascular disease    Pneumonia 4 yrs ago   Pulmonary fibrosis (HCC) 10/06/2011   Pulmonary nodule 10/08/2011   PVD (peripheral vascular disease)    Sacral ulcer (HCC)    Sleep apnea    cannot tolerate   Stroke (HCC)    left sided weakness   Suprapubic catheter (HCC)    TIA (transient ischemic attack)    Tremors of nervous system 10/08/2011   Urinary tract infection    Ventral hernia without obstruction or gangrene     Large 8X9cm ventral hernia with loss of domain. CT reads report as diastasis recti with herniation or diastasis recti.  Dr. Kallie, Surgery, reviewed CT with radiology and there is herniation with only hernia sac or peritoneum over the bowel and large separation of the rectus muslce (i.e. diastasis recti aka loss of domain).  No surgical intervention recommended given size, age, and health.       Past Surgical History:  Procedure Laterality Date   APPENDECTOMY  09/2005   at time of left hemicolectomy   BACK SURGERY  1976/1979   lower   BILIARY DILATION N/A 03/03/2020   Procedure:  BILIARY DILATION;  Surgeon: Golda Claudis PENNER, MD;  [facility];  Service: Endoscopy;  Laterality: N/A;   CATARACT EXTRACTION W/PHACO Right 03/08/2018   Procedure: CATARACT EXTRACTION PHACO AND INTRAOCULAR LENS PLACEMENT RIGHT EYE;  Surgeon: Perley Hamilton, MD;  Location: [facility];  Service: Ophthalmology;  Laterality: Right;  CDE: 8.86   CATARACT EXTRACTION W/PHACO Left 04/05/2018   Procedure: CATARACT EXTRACTION PHACO AND INTRAOCULAR LENS PLACEMENT (IOC);  Surgeon: Perley Hamilton, MD;  Location: [facility];  Service: Ophthalmology;  Laterality: Left;  CDE: 7.36   CENTRAL LINE INSERTION-TUNNELED Right 09/11/2020   Procedure: PLACEMENT OF TUNNELED CENTRAL LINE INTO JUGULAR VEIN;  Surgeon: Kallie Manuelita BROCKS, MD;  Location: [facility];  Service: General;  Laterality: Right;   CHOLECYSTECTOMY     Dr. Claudene   COLON SURGERY  09/2005   Fleishman: four tubular adenomas, large adenomatous polyp with HIGH GRADE dysplasia   COLONOSCOPY  11/2004   Dr. Lendon sessile polyp splenic flexure, 10mm sessile polyp desc colon, tubulovillous adenoma (bx not removed)   COLONOSCOPY  01/2005   poor prep, polyp could not be found   COLONOSCOPY  05/2005   with EMR, polypectomy Dr. Norleen Dec, bx showed high grade dysplasia, partially resected   COLONOSCOPY  09/2005   Dr. Dec, [place] ink tattooing, four villous colon polyp (3 had been missed on previous colonoscopies due to limitations of procedures   COLONOSCOPY  09/2006   normal TI, no polyps   COLONOSCOPY  10/2007   Dr. Randol distal mammillations, benign bx, normal TI, random bx neg for microscopic colitis   CYSTOSCOPY W/ URETERAL STENT PLACEMENT Left 09/05/2020   Procedure: CYSTOSCOPY WITH RETROGRADE PYELOGRAM/URETERAL STENT PLACEMENT;  Surgeon: Cam Morene ORN, MD;  Location: [facility];  Service: Urology;  Laterality: Left;   CYSTOSCOPY WITH LITHOLAPAXY N/A 07/27/2018   Procedure: CYSTOSCOPY WITH LITHOLAPAXY VIA  SUPRAPUBIC TUBE;  Surgeon: Matilda Senior, MD;  Location: [facility];  Service: Urology;  Laterality: N/A;   CYSTOSCOPY WITH RETROGRADE PYELOGRAM, URETEROSCOPY AND STENT PLACEMENT Left 06/09/2017   Procedure: CYSTOSCOPY WITH LEFT RETROGRADE PYELOGRAM, LEFT URETEROSCOPY, LEFT URETEROSCOPIC STONE EXTRACTION, LEFT URETERAL STENT PLACEMENT;  Surgeon: Matilda Senior, MD;  Location: [facility];  Service: Urology;  Laterality: Left;  CYSTOSCOPY/URETEROSCOPY/HOLMIUM LASER/STENT PLACEMENT Left 05/22/2020   Procedure: CYSTOSCOPY/URETEROSCOPY/STENT PLACEMENT;  Surgeon: Matilda Senior, MD;  Location: [facility];  Service: Urology;  Laterality: Left;   ERCP N/A 03/03/2020   Procedure: ENDOSCOPIC RETROGRADE CHOLANGIOPANCREATOGRAPHY (ERCP);  Surgeon: Golda Claudis PENNER, MD;  [facility];  Service: Endoscopy;  Laterality: N/A;   ERCP N/A 04/20/2020   Procedure: ENDOSCOPIC RETROGRADE CHOLANGIOPANCREATOGRAPHY (ERCP);  Surgeon: Golda Claudis PENNER, MD;  [facility];  Service: Endoscopy;  Laterality: N/A;  to be done at 7:30am in OR   GASTROINTESTINAL STENT REMOVAL N/A 04/20/2020   Procedure: STENT REMOVAL;  Surgeon: Golda Claudis PENNER, MD;  [facility];  Service: Endoscopy;  Laterality: N/A;   INGUINAL HERNIA REPAIR  1971   bilateral   INSERTION OF SUPRAPUBIC CATHETER  06/09/2017   Procedure: EXCHANGE OF SUPRAPUBIC CATHETER;  Surgeon: Matilda Senior, MD;  Location: [facility];  Service: Urology;;   INSERTION OF SUPRAPUBIC CATHETER  05/22/2020   Procedure: SUPRAPUBIC CATHETER EXCHANGE;  Surgeon: Matilda Senior, MD;  Location: [facility];  Service: Urology;;   IR NEPHROSTOMY EXCHANGE LEFT  03/25/2021   IR NEPHROSTOMY EXCHANGE LEFT  05/20/2021   IR NEPHROSTOMY EXCHANGE LEFT  07/13/2021   IR NEPHROSTOMY EXCHANGE LEFT  09/09/2021   IR NEPHROSTOMY EXCHANGE LEFT  11/04/2021   IR NEPHROSTOMY EXCHANGE LEFT  12/23/2021   IR NEPHROSTOMY EXCHANGE LEFT  02/17/2022   IR NEPHROSTOMY EXCHANGE LEFT  04/14/2022   IR NEPHROSTOMY EXCHANGE LEFT   06/09/2022   IR NEPHROSTOMY EXCHANGE LEFT  07/28/2022   IR NEPHROSTOMY EXCHANGE LEFT  09/09/2022   IR NEPHROSTOMY EXCHANGE LEFT  11/04/2022   IR NEPHROSTOMY EXCHANGE LEFT  12/30/2022   IR NEPHROSTOMY EXCHANGE LEFT  02/25/2023   IR NEPHROSTOMY EXCHANGE LEFT  03/31/2023   IR NEPHROSTOMY EXCHANGE LEFT  05/26/2023   IR NEPHROSTOMY EXCHANGE LEFT  07/21/2023   IR NEPHROSTOMY EXCHANGE LEFT  09/15/2023   IR NEPHROSTOMY EXCHANGE LEFT  12/22/2023   IR NEPHROSTOMY EXCHANGE LEFT  03/25/2024   IR NEPHROSTOMY EXCHANGE LEFT  04/28/2024   IR NEPHROSTOMY EXCHANGE LEFT  06/02/2024   IR NEPHROSTOMY EXCHANGE LEFT  09/08/2024   IR NEPHROSTOMY PLACEMENT LEFT  05/26/2017   IR NEPHROSTOMY PLACEMENT LEFT  05/02/2020   IR NEPHROSTOMY PLACEMENT LEFT  02/15/2021   IR NEPHROSTOMY PLACEMENT LEFT  02/22/2024   IR NEPHROSTOMY TUBE CHANGE  10/29/2023   IR URETERAL STENT PERC REMOVAL MOD SED  03/25/2021   KIDNEY STONE SURGERY  09/13/2015   LITHOTRIPSY N/A 03/03/2020   Procedure: MECHANICAL LITHOTRIPSY WITH REMOVAL OF MULTIPLE STONE FRAGMENTS;  Surgeon: Golda Claudis PENNER, MD;  [facility];  Service: Endoscopy;  Laterality: N/A;   NEPHROLITHOTOMY Left 09/13/2015   Procedure: LEFT PERCUTANEOUS NEPHROLITHOTOMY ;  Surgeon: Senior Matilda, MD;  Location: [facility];  Service: Urology;  Laterality: Left;   NEPHROSTOMY TUBE REMOVAL  05/22/2020   Procedure: NEPHROSTOMY TUBE REMOVAL;  Surgeon: Matilda Senior, MD;  Location: [facility];  Service: Urology;;   [facility] REMOVAL Right 09/20/2020   Procedure: MINOR REMOVAL CENTRAL LINE;  Surgeon: Kallie Manuelita BROCKS, MD;  Location: [facility];  Service: General;  Laterality: Right;  Pt to arrive at 7:30am for procedure   REMOVAL OF STONES N/A 04/20/2020   Procedure: REMOVAL OF STONES;  Surgeon: Golda Claudis PENNER, MD;  [facility];  Service: Endoscopy;  Laterality: N/A;   SPHINCTEROTOMY  03/03/2020   Procedure: BILLARY SPHINCTEROTOMY;  Surgeon: Golda Claudis PENNER, MD;  [facility];  Service: Endoscopy;;   SUPRAPUBIC CATHETER INSERTION        Social History:     Social History   Tobacco Use   Smoking status: Former    Current packs/day: 0.00    Average packs/day: 1 pack/day for 25.0 years (25.0 ttl pk-yrs)    Types: Cigarettes    Start date: 03/04/1963    Quit date: 03/03/1988    Years since quitting: 36.5   Smokeless tobacco: Never  Substance Use Topics   Alcohol  use: No     Lives -     Family History :     Family History  Problem Relation Age of Onset   Cirrhosis Brother        etoh   Stroke Mother 68   Coronary artery disease Father 75   Heart attack Brother    Cancer Sister    Multiple sclerosis Other    Colon cancer Neg Hx      Home Medications:   Prior to Admission medications  Medication Sig Start Date End Date Taking? Authorizing Provider  acetaminophen  (TYLENOL ) 325 MG tablet Take 650 mg by mouth every 4 (four) hours as needed for mild pain (pain score 1-3) or fever. Do not exceed 3000 mg in 24 hours.   Yes [provider]  acetaminophen  (TYLENOL ) 500 MG tablet Take 1,000 mg by mouth in the morning and at bedtime. For pain related to fall   Yes [provider]  ascorbic acid  (VITAMIN C ) 500 MG tablet Take 500 mg by mouth every morning.   Yes [provider]  aspirin  81 MG chewable tablet Chew 1 tablet (81 mg total) by mouth daily with breakfast. 02/02/24  Yes Emokpae, Courage, MD  busPIRone  (BUSPAR ) 5 MG tablet Take 5 mg by mouth 3 (three) times daily.   Yes [provider]  diazepam  (VALIUM ) 2 MG tablet Take 1 tablet (2 mg total) by mouth 2 (two) times daily. 07/11/24  Yes Bryn Bernardino NOVAK, MD  HYDROcodone -acetaminophen  (NORCO/VICODIN) 5-325 MG tablet Take 1 tablet by mouth every 8 (eight) hours as needed for moderate pain (pain score 4-6). Patient taking differently: Take 1 tablet by mouth in the morning, at noon, and at bedtime. 07/11/24  Yes Bryn Bernardino NOVAK, MD  levothyroxine  (SYNTHROID ) 75 MCG tablet  Take 75 mcg by mouth in the morning. 07/02/21  Yes [provider]  lidocaine  (LIDODERM ) 5 % Place 1 patch onto the skin daily. Remove & Discard patch within 12 hours or as directed by MD   Yes [provider]  loratadine  (CLARITIN ) 10 MG tablet Take 10 mg by mouth at bedtime. 07/04/24 09/17/24 Yes [provider]  methocarbamol  (ROBAXIN ) 500 MG tablet Take 500 mg by mouth every 6 (six) hours as needed for muscle spasms.   Yes [provider]  mirtazapine  (REMERON ) 15 MG tablet Take 0.5 tablets (7.5 mg total) by mouth at bedtime. Patient taking differently: Take 15 mg by mouth at bedtime. 02/02/24  Yes Pearlean Manus, MD  Multiple Vitamin (MULTIVITAMIN) tablet Take 1 tablet by mouth every morning.   Yes [provider]  omeprazole  (PRILOSEC  OTC) 20 MG tablet Take 20 mg by mouth daily.   Yes [provider]  oxybutynin  (DITROPAN ) 5 MG tablet Take 5 mg by mouth 2 (two) times daily.   Yes [provider]  OXYGEN  Inhale 1 L into the lungs as needed (shortness of breath, O2 sats <88%).   Yes [provider]  polyethylene glycol (MIRALAX  / GLYCOLAX )  17 g packet Take 17 g by mouth every morning.   Yes [provider]  potassium chloride  (KLOR-CON ) 20 MEQ packet Take 20 mEq by mouth daily.   Yes [provider]  senna-docusate (SENOKOT-S) 8.6-50 MG tablet Take 2 tablets by mouth at bedtime. 02/02/24  Yes Emokpae, Courage, MD  sodium chloride  (OCEAN) 0.65 % SOLN nasal spray Place 1 spray into both nostrils in the morning, at noon, in the evening, and at bedtime. For nasal dryness   Yes [provider]  thiamine  (VITAMIN B-1) 100 MG tablet Take 1 tablet (100 mg total) by mouth daily. 10/12/23  Yes Shah, Pratik D, DO  Amino Acids-Protein Hydrolys (PRO-STAT PROFILE PO) Take 30 mLs by mouth 2 (two) times daily.    [provider]  lidocaine  4 % Place 1 patch onto the skin every 12 (twelve) hours. Directions per  [facility]:? Apply to right shoulder for up to 12 hours then apply at bedtime for pain and remove at 10 pm Application times: 0800, 2200    [provider]  melatonin 3 MG TABS tablet Take 3 mg by mouth at bedtime.    [provider]     Allergies:    Allergies[1]   Physical Exam:   Vitals  Blood pressure (!) 145/87, pulse 90, temperature 98.1 F (36.7 C), temperature source Oral, resp. rate 16, height 5' 5 (1.651 m), weight 45.4 kg, SpO2 100%.   1. General Frail, chronically ill-appearing male, deconditioned, laying in bed in no apparent distress  2. Normal affect and insight, Not Suicidal or Homicidal, Awake Alert, Oriented X 3.  3. No F.N deficits, ALL C.Nerves Intact  4. Ears and Eyes appear Normal, Conjunctivae clear, PERRLA.  I oral Mucosa.  5. Supple Neck, , No Carotid Bruits.  6. Symmetrical Chest Jonsson movement, diminished air entry at the bases with rales at left lung base  7. RRR, No Gallops, Rubs or Murmurs, No Parasternal Heave.  8. Positive Bowel Sounds, Abdomen Soft, prepubic Foley present, left nephrostomy present, periumbilical hernia present, nontender, reproducible.    9.  No Cyanosis, delayed skin Turgor  10.  Muscle wasting, joints appear normal  Pressure ulcers present, please see pictures under media section    Data Review:    CBC Recent Labs  Lab 09/17/24 1427  WBC 9.7  HGB 9.6*  HCT 32.5*  PLT 357  MCV 95.0  MCH 28.1  MCHC 29.5*  RDW 18.9*  LYMPHSABS 2.6  MONOABS 0.6  EOSABS 0.3  BASOSABS 0.1   ------------------------------------------------------------------------------------------------------------------  Chemistries  Recent Labs  Lab 09/17/24 1427  NA 138  K 5.1  CL 104  CO2 20*  GLUCOSE 118*  BUN 29*  CREATININE 2.22*  CALCIUM  8.5*  AST 16  ALT <5  ALKPHOS 137*  BILITOT 0.3   ------------------------------------------------------------------------------------------------------------------ estimated  creatinine clearance is 16.8 mL/min (A) (by C-G formula based on SCr of 2.22 mg/dL (H)). ------------------------------------------------------------------------------------------------------------------ No results for input(s): TSH, T4TOTAL, T3FREE, THYROIDAB in the last 72 hours.  Invalid input(s): FREET3  Coagulation profile Recent Labs  Lab 09/17/24 1427  INR 1.0   ------------------------------------------------------------------------------------------------------------------- No results for input(s): DDIMER in the last 72 hours. -------------------------------------------------------------------------------------------------------------------  Cardiac Enzymes No results for input(s): CKMB, TROPONINI, MYOGLOBIN in the last 168 hours.  Invalid input(s): CK ------------------------------------------------------------------------------------------------------------------    Component Value Date/Time   BNP 488.0 (H) 09/29/2023 1411     ---------------------------------------------------------------------------------------------------------------  Urinalysis    Component Value Date/Time   COLORURINE YELLOW 07/06/2024 1737   APPEARANCEUR  CLOUDY (A) 07/06/2024 1737   LABSPEC 1.014 07/06/2024 1737   PHURINE 8.0 07/06/2024 1737   GLUCOSEU NEGATIVE 07/06/2024 1737   HGBUR SMALL (A) 07/06/2024 1737   BILIRUBINUR NEGATIVE 07/06/2024 1737   BILIRUBINUR small (A) 10/26/2021 1538   KETONESUR NEGATIVE 07/06/2024 1737   PROTEINUR 100 (A) 07/06/2024 1737   UROBILINOGEN 0.2 10/26/2021 1538   UROBILINOGEN 0.2 07/17/2014 1215   NITRITE NEGATIVE 07/06/2024 1737   LEUKOCYTESUR MODERATE (A) 07/06/2024 1737    ----------------------------------------------------------------------------------------------------------------   Imaging Results:    DG Chest Port 1 View Result Date: 09/17/2024 CLINICAL DATA:  Provided history: Questionable sepsis - evaluate for  abnormality EXAM: PORTABLE CHEST 1 VIEW COMPARISON:  Radiograph 07/06/2024, CT 07/10/2020 FINDINGS: Interstitial coarsening with bronchial thickening, chronic but increased in degree from prior exam. Emphysema with question of basilar fibrosis. Ill-defined patchy opacity at the left lung base. The heart is prominent size but stable. Blunting of the costophrenic angles, possible small effusions. No pneumothorax. Chronic right shoulder arthropathy. IMPRESSION: 1. Ill-defined patchy opacity at the left lung base, atelectasis versus pneumonia. 2. Chronic interstitial coarsening with bronchial thickening, increased in degree from prior exam. This may be infectious/inflammatory. 3. Emphysema with question of basilar fibrosis. 4. Possible small pleural effusions. Electronically Signed   By: Andrea Gasman M.D.   On: 09/17/2024 14:56       Assessment & Plan:    Principal Problem:   Lactic acidosis Active Problems:   Cerebrovascular disease   Chronic diastolic (congestive) heart failure (HCC)   Essential hypertension   CKD (chronic kidney disease) stage 4, GFR 15-29 ml/min (HCC)   Nephrostomy present (HCC)   Pressure ulcer    Lactic acidosis Dehydration Pneumonia Possible UTI -Volume depletion versus infectious process -Continue with IV fluids, trending down with hydration - Possible infectious due to UTI versus pneumonia. - Chest x-ray significant for left lung opacity, will start on Rocephin  and azithromycin  - History of dysphagia, reports he is on dysphagia 1 with thick liquid, will consult SLP. - Possible urine infection, suprapubic Foley catheter exchanged in ED, UA is pending, but meanwhile continue with Rocephin . - Received broad-spectrum antibiotics - Follow-up blood cultures. - Continue with IV fluids    AKI on stage IIIb CKD:  - Baseline creatinine 1.9, currently 2.22, continue with IV fluids     Chronic combined HFrEF  HTN -2D echo January 2025 LVEF was 30 - 35%. -  Normotensive, no significant evidence of volume overload    Neurogenic bladder, obstructive uropathy, chronic percutaneous nephrostomy tube:  - On UA . - Prepubic catheter exchanged in ED . - Nephrostomy tube exchanged by IR 09/08/2024     COPD, pulmonary fibrosis, chronic hypoxic respiratory failure:  - On oxygen  as needed - No wheezing or respiratory distress - Continue scheduled DuoNebs and as needed albuterol    GERD - Continue with PPI   Hypothyroidism:   - Continue levothyroxine    Deconditioning/multiple sclerosis Failure to thrive Protein calorie malnutrition - Continue with supplement. - Will consult nutritionist  Multiple pressure ulcers, chronic, POA Sacral, right and left buttocks pressure ulcers , left heel ulcer. - Chronic, do not appear to be infected, will consult wound care   PAD -not a candidate for any surgical intervention, vascular surgery recommended hospice. - Continue with aspirin    DVT Prophylaxis Heparin   AM Labs Ordered, also please review Full Orders  Family Communication: Admission, patients condition and plan of care including tests being ordered have been discussed with the patient  who indicate understanding  and agree with the plan and Code Status.  Code Status full code, confirmed by patient  Likely DC to back to SNF  Consults called: None  Admission status: Inpatient  Time spent in minutes : 75 minutes   Brayton Lye M.D on 09/17/2024 at 4:27 PM   [facility] - Office  [phone]        [1]  Allergies Allergen Reactions   Ciprofloxacin  Other (See Comments)    Trouble swallowing according to wife Clemens out in the floor Pt can tolerate Levaquin    Tetracyclines & Related Anaphylaxis and Rash   "

## 2024-09-17 NOTE — Sepsis Progress Note (Signed)
 Sepsis protocol is being followed by [facility].

## 2024-09-17 NOTE — ED Triage Notes (Signed)
 Pt arrived via [facility] from [place]. EMS called to facility due to inability to obtain IV access for fluids. Pt's only complaint is of bedsores.

## 2024-09-17 NOTE — Consult Note (Signed)
 WOC Nurse Consult Note: Reason for Consult: pressure ulcer  MS, neurogenic bladder (suprapubic catheter and chronic nephrostomy tube to the left kidney), TIA, PVC, ventral hernia, pulmonary fibrosis, hypothyroidism, chronic sacral osteomyelitis with a nonhealing, stage IV sacral ulcer, paroxysmal atrial tachy, CAD, PVD, kidney stones, CKD, chronic heel wounds (not a candidate for vascular intervention as he would need an aortobifemoral bypass to treat his aortoiliac occlusion). Vascular recommended transition to hospice.  Wound type: Stage 4 Pressure Injury; sacrum Stage 4 Pressure Injury right ischium  Has had Stage 3 Pressure Injury; left ischium documented on 10/8; today's photo does not have this wound pictured Pressure Injury POA: Yes Measurement: see nursing flow sheets Wound bed: Wounds all appear clean with tunnelling noted at the right ischium and sacral wound (2 o'clock, 5 o'clock and 9 o'clock)  Drainage (amount, consistency, odor) see nursing flow sheets Periwound: intact  Dressing procedure/placement/frequency: Cleanse all wounds with Vashe Soila [phone]), moisten gauze with Vashe and pack tunneled areas, cover sacral wound, top with ABD pad, pack ischial wounds with Vashe moist gauze (may need to use small roll gauze - 1 or 2 conform--[id number]). Top with ABD pad, secure with tape or mesh underwear. Change BID  Add low air loss mattress for moisture management and pressure redistribution Add Prevalon boots for offloading heels in high risk patient Add consult to RD for wound healing supplementation   Re consult if needed, will not follow at this time. Thanks  B[employer]onna [employer], RN,CWOCN, CNS, [employer] [phone] .

## 2024-09-17 NOTE — ED Provider Notes (Signed)
 " [facility] AT [facility] Provider Note   CSN: [id number] Arrival date & time: 09/17/24  1312     Patient presents with: No chief complaint on file.   Jordan Ward is a 83 y.o. male.   Pt is a 83 yo male with pmhx significant for arthritis, MS, neurogenic bladder (suprapubic catheter and chronic nephrostomy tube to the left kidney), TIA, PVC, ventral hernia, pulmonary fibrosis, hypothyroidism, chronic sacral osteomyelitis with a nonhealing, stage IV sacral ulcer, paroxysmal atrial tachy, CAD, PVD, kidney stones, CKD, chronic heel wounds (not a candidate for vascular intervention as he would need an aortobifemoral bypass to treat his aortoiliac occlusion).  Vascular recommended transition to hospice.  Pt was sent here from his facility because they thought he needed fluids and could not get an IV.  He complains of pain to his low back.  He does not think his suprapubic catheter has been changed in several months.        Prior to Admission medications  Medication Sig Start Date End Date Taking? Authorizing Provider  acetaminophen  (TYLENOL ) 325 MG tablet Take 650 mg by mouth every 4 (four) hours as needed for mild pain (pain score 1-3) or fever. Do not exceed 3000 mg in 24 hours.    [provider]  acetaminophen  (TYLENOL ) 500 MG tablet Take 1,000 mg by mouth in the morning and at bedtime. For pain related to fall    [provider]  Amino Acids-Protein Hydrolys (PRO-STAT PROFILE PO) Take 30 mLs by mouth 2 (two) times daily.    [provider]  ascorbic acid  (VITAMIN C ) 500 MG tablet Take 500 mg by mouth every morning.    [provider]  aspirin  81 MG chewable tablet Chew 1 tablet (81 mg total) by mouth daily with breakfast. 02/02/24   Pearlean, Courage, MD  busPIRone  (BUSPAR ) 5 MG tablet Take 5 mg by mouth 3 (three) times daily.    [provider]  carboxymethylcellulose 1 % ophthalmic solution Place 2 drops into  both eyes every 4 (four) hours as needed (dry eyes).    [provider]  diazepam  (VALIUM ) 2 MG tablet Take 1 tablet (2 mg total) by mouth 2 (two) times daily. 07/11/24   Bryn Bernardino NOVAK, MD  HYDROcodone -acetaminophen  (NORCO/VICODIN) 5-325 MG tablet Take 1 tablet by mouth every 8 (eight) hours as needed for moderate pain (pain score 4-6). 07/11/24   Bryn Bernardino NOVAK, MD  levothyroxine  (SYNTHROID ) 75 MCG tablet Take 75 mcg by mouth in the morning. 07/02/21   [provider]  lidocaine  4 % Place 1 patch onto the skin every 12 (twelve) hours. Directions per [facility]:? Apply to right shoulder for up to 12 hours then apply at bedtime for pain and remove at 10 pm Application times: 0800, 2200    [provider]  loratadine  (CLARITIN ) 10 MG tablet Take 10 mg by mouth at bedtime. 07/04/24 07/18/24  [provider]  melatonin 3 MG TABS tablet Take 3 mg by mouth at bedtime.    [provider]  methocarbamol  (ROBAXIN ) 500 MG tablet Take 500 mg by mouth every 6 (six) hours as needed for muscle spasms.    [provider]  mirtazapine  (REMERON ) 15 MG tablet Take 0.5 tablets (7.5 mg total) by mouth at bedtime. Patient taking differently: Take 15 mg by mouth at bedtime. 02/02/24   Pearlean Manus, MD  Multiple Vitamin (MULTIVITAMIN) tablet Take 1 tablet by mouth every morning.  [provider]  omeprazole  (PRILOSEC  OTC) 20 MG tablet Take 20 mg by mouth daily.    [provider]  oxybutynin  (DITROPAN ) 5 MG tablet Take 5 mg by mouth 2 (two) times daily.    [provider]  OXYGEN  Inhale 1 L into the lungs as needed (shortness of breath, O2 sats <88%).    [provider]  polyethylene glycol (MIRALAX  / GLYCOLAX ) 17 g packet Take 17 g by mouth every morning.    [provider]  potassium chloride  (KLOR-CON ) 20 MEQ packet Take 20 mEq by mouth daily.    [provider]  senna-docusate (SENOKOT-S) 8.6-50 MG tablet Take 2  tablets by mouth at bedtime. 02/02/24   Pearlean Manus, MD  sodium chloride  (OCEAN) 0.65 % SOLN nasal spray Place 1 spray into both nostrils in the morning, at noon, in the evening, and at bedtime. For nasal dryness    [provider]  Sodium Chloride  Flush (SALINE FLUSH IV) Irrigate with 5 mLs as directed 2 (two) times a week. Mondays and Thursdays    [provider]  Sodium Chloride  Flush (SALINE FLUSH IV) Irrigate with 5 mLs as directed every 8 (eight) hours as needed (irrigation).    [provider]  thiamine  (VITAMIN B-1) 100 MG tablet Take 1 tablet (100 mg total) by mouth daily. 10/12/23   Maree, Pratik D, DO    Allergies: Ciprofloxacin  and Tetracyclines & related    Review of Systems  Skin:  Positive for wound.  All other systems reviewed and are negative.   Updated Vital Signs BP (!) 145/87   Pulse 90   Temp 98.1 F (36.7 C) (Oral)   Resp 16   Ht 5' 5 (1.651 m)   Wt 45.4 kg   SpO2 100%   BMI 16.64 kg/m   Physical Exam Vitals and nursing note reviewed.  Constitutional:      Appearance: Normal appearance. He is ill-appearing.  HENT:     Head: Normocephalic and atraumatic.     Right Ear: External ear normal.     Left Ear: External ear normal.     Nose: Nose normal.     Mouth/Throat:     Mouth: Mucous membranes are moist.  Eyes:     Extraocular Movements: Extraocular movements intact.     Conjunctiva/sclera: Conjunctivae normal.     Pupils: Pupils are equal, round, and reactive to light.  Cardiovascular:     Rate and Rhythm: Normal rate and regular rhythm.     Pulses: Normal pulses.     Heart sounds: Normal heart sounds.  Pulmonary:     Effort: Pulmonary effort is normal.     Breath sounds: Normal breath sounds.  Abdominal:     General: Abdomen is flat. Bowel sounds are normal.     Palpations: Abdomen is soft.     Comments: Suprapubic catheter in place Ventral hernia noted   Musculoskeletal:        General: Normal range of motion.      Cervical back: Normal range of motion and neck supple.  Skin:    General: Skin is warm.     Capillary Refill: Capillary refill takes less than 2 seconds.     Comments: Stage IV sacral decub (see pictures) covered in stool Right ischium tunneled wound (see pictures) Chronic wounds to both heels   Neurological:     Mental Status: He is alert.        (all labs ordered are listed, but only abnormal  results are displayed) Labs Reviewed  LACTIC ACID, PLASMA - Abnormal; Notable for the following components:      Result Value   Lactic Acid, Venous 3.8 (*)    All other components within normal limits  COMPREHENSIVE METABOLIC PANEL WITH GFR - Abnormal; Notable for the following components:   CO2 20 (*)    Glucose, Bld 118 (*)    BUN 29 (*)    Creatinine, Ser 2.22 (*)    Calcium  8.5 (*)    Albumin 3.0 (*)    Alkaline Phosphatase 137 (*)    GFR, Estimated 29 (*)    All other components within normal limits  CBC WITH DIFFERENTIAL/PLATELET - Abnormal; Notable for the following components:   RBC 3.42 (*)    Hemoglobin 9.6 (*)    HCT 32.5 (*)    MCHC 29.5 (*)    RDW 18.9 (*)    All other components within normal limits  CULTURE, BLOOD (ROUTINE X 2)  CULTURE, BLOOD (ROUTINE X 2)  PROTIME-INR  LACTIC ACID, PLASMA  URINALYSIS, W/ REFLEX TO CULTURE (INFECTION SUSPECTED)    EKG: None  Radiology: DG Chest Port 1 View Result Date: 09/17/2024 CLINICAL DATA:  Provided history: Questionable sepsis - evaluate for abnormality EXAM: PORTABLE CHEST 1 VIEW COMPARISON:  Radiograph 07/06/2024, CT 07/10/2020 FINDINGS: Interstitial coarsening with bronchial thickening, chronic but increased in degree from prior exam. Emphysema with question of basilar fibrosis. Ill-defined patchy opacity at the left lung base. The heart is prominent size but stable. Blunting of the costophrenic angles, possible small effusions. No pneumothorax. Chronic right shoulder arthropathy. IMPRESSION: 1. Ill-defined  patchy opacity at the left lung base, atelectasis versus pneumonia. 2. Chronic interstitial coarsening with bronchial thickening, increased in degree from prior exam. This may be infectious/inflammatory. 3. Emphysema with question of basilar fibrosis. 4. Possible small pleural effusions. Electronically Signed   By: Andrea Gasman M.D.   On: 09/17/2024 14:56     BLADDER CATHETERIZATION  Date/Time: 09/17/2024 3:22 PM  Performed by: Dean Clarity, MD Authorized by: Dean Clarity, MD   Consent:    Consent obtained:  Verbal   Consent given by:  Patient   Alternatives discussed:  No treatment Universal protocol:    Patient identity confirmed:  Verbally with patient Pre-procedure details:    Preparation: Patient was prepped and draped in usual sterile fashion   Anesthesia:    Anesthesia method:  None Procedure details:    Provider performed due to:  Altered anatomy, complicated insertion and nurse unable to complete   Catheter insertion:  Indwelling   Catheter size:  24 Fr   Bladder irrigation: no     Number of attempts:  1   Urine characteristics:  Cloudy Post-procedure details:    Procedure completion:  Tolerated well, no immediate complications Comments:     Suprapubic catheter change    Medications Ordered in the ED  ceFEPIme  (MAXIPIME ) 2 g in sodium chloride  0.9 % 100 mL IVPB (has no administration in time range)  metroNIDAZOLE  (FLAGYL ) IVPB 500 mg (has no administration in time range)  vancomycin  (VANCOCIN ) IVPB 1000 mg/200 mL premix (has no administration in time range)  sodium chloride  0.9 % bolus 1,000 mL (1,000 mLs Intravenous New Bag/Given 09/17/24 1515)  morphine  (PF) 4 MG/ML injection 4 mg (4 mg Intravenous Given 09/17/24 1515)  ondansetron  (ZOFRAN ) injection 4 mg (4 mg Intravenous Given 09/17/24 1515)  Medical Decision Making Amount and/or Complexity of Data Reviewed Labs: ordered. Radiology: ordered.  Risk Prescription  drug management. Decision regarding hospitalization.   This patient presents to the ED for concern of weakness, this involves an extensive number of treatment options, and is a complaint that carries with it a high risk of complications and morbidity.  The differential diagnosis includes sepsis, electrolyte abn, dehydration, anemia   Co morbidities that complicate the patient evaluation  arthritis, MS, neurogenic bladder (suprapubic catheter and chronic nephrostomy tube to the left kidney), TIA, PVC, ventral hernia, pulmonary fibrosis, hypothyroidism, chronic sacral osteomyelitis with a nonhealing, stage IV sacral ulcer, paroxysmal atrial tachy, CAD, PVD, kidney stones, CKD, chronic heel wounds    Additional history obtained:  Additional history obtained from epic chart review External records from outside source obtained and reviewed including EMS report   Lab Tests:  I Ordered, and personally interpreted labs.  The pertinent results include:  cbc with hgb low at 9.6 (stable), cmp with bun 29 and cr 2.22 (stable); lactic elevated at 3.8   Imaging Studies ordered:  I ordered imaging studies including cxr  I independently visualized and interpreted imaging which showed  . Ill-defined patchy opacity at the left lung base, atelectasis  versus pneumonia.  2. Chronic interstitial coarsening with bronchial thickening,  increased in degree from prior exam. This may be  infectious/inflammatory.  3. Emphysema with question of basilar fibrosis.  4. Possible small pleural effusions.   I agree with the radiologist interpretation   Cardiac Monitoring:  The patient was maintained on a cardiac monitor.  I personally viewed and interpreted the cardiac monitored which showed an underlying rhythm of: nsr   Medicines ordered and prescription drug management:  I ordered medication including ivfs/abx  for sepsis  Reevaluation of the patient after these medicines showed that the patient  improved I have reviewed the patients home medicines and have made adjustments as needed   Test Considered:  ct   Critical Interventions:  Abx/ivbs   Consultations Obtained:  I requested consultation with the hospitalist (Dr. Sherlon),  and discussed lab and imaging findings as well as pertinent plan - he will admit   Problem List / ED Course:  Sepsis:  urine pending, but pt meets sepsis criteria.  Possible pna on CXR.  Pt given sepsis fluids and iv abx.  He will need admission.   Reevaluation:  After the interventions noted above, I reevaluated the patient and found that they have :improved   Social Determinants of Health:  Lives in SNF   Dispostion:  After consideration of the diagnostic results and the patients response to treatment, I feel that the patent would benefit from admission.       Final diagnoses:  Sepsis, due to unspecified organism, unspecified whether acute organ dysfunction present (HCC)  Sacral decubitus ulcer, stage IV (HCC)  Suprapubic catheter [facility])  H/O insertion of nephrostomy tube    ED Discharge Orders     None          Dean Clarity, MD 09/17/24 1546  "

## 2024-09-17 NOTE — ED Notes (Signed)
 Pt states he takes his meds with applesauce and crushed.

## 2024-09-18 LAB — BASIC METABOLIC PANEL WITH GFR
Anion gap: 8 (ref 5–15)
BUN: 28 mg/dL — ABNORMAL HIGH (ref 8–23)
CO2: 24 mmol/L (ref 22–32)
Calcium: 7.9 mg/dL — ABNORMAL LOW (ref 8.9–10.3)
Chloride: 110 mmol/L (ref 98–111)
Creatinine, Ser: 2.18 mg/dL — ABNORMAL HIGH (ref 0.61–1.24)
GFR, Estimated: 30 mL/min — ABNORMAL LOW
Glucose, Bld: 102 mg/dL — ABNORMAL HIGH (ref 70–99)
Potassium: 4.9 mmol/L (ref 3.5–5.1)
Sodium: 141 mmol/L (ref 135–145)

## 2024-09-18 LAB — URINALYSIS, W/ REFLEX TO CULTURE (INFECTION SUSPECTED)
Bacteria, UA: NONE SEEN
Bilirubin Urine: NEGATIVE
Glucose, UA: NEGATIVE mg/dL
Hgb urine dipstick: NEGATIVE
Ketones, ur: NEGATIVE mg/dL
Nitrite: NEGATIVE
Protein, ur: 100 mg/dL — AB
Specific Gravity, Urine: 1.017 (ref 1.005–1.030)
WBC, UA: >50 WBC/hpf (ref 0–5)
pH: 8 (ref 5.0–8.0)

## 2024-09-18 LAB — CBC
HCT: 31 % — ABNORMAL LOW (ref 39.0–52.0)
Hemoglobin: 9 g/dL — ABNORMAL LOW (ref 13.0–17.0)
MCH: 28.1 pg (ref 26.0–34.0)
MCHC: 29 g/dL — ABNORMAL LOW (ref 30.0–36.0)
MCV: 96.9 fL (ref 80.0–100.0)
Platelets: 285 K/uL (ref 150–400)
RBC: 3.2 MIL/uL — ABNORMAL LOW (ref 4.22–5.81)
RDW: 18.9 % — ABNORMAL HIGH (ref 11.5–15.5)
WBC: 8.1 K/uL (ref 4.0–10.5)
nRBC: 0 % (ref 0.0–0.2)

## 2024-09-18 LAB — LACTIC ACID, PLASMA: Lactic Acid, Venous: 1.7 mmol/L (ref 0.5–1.9)

## 2024-09-18 MED ORDER — HYDROMORPHONE HCL 1 MG/ML IJ SOLN
0.5000 mg | INTRAMUSCULAR | Status: DC | PRN
  Administered 2024-09-18 – 2024-09-20 (×3): 0.5 mg via INTRAVENOUS
  Filled 2024-09-18 (×3): qty 0.5

## 2024-09-18 NOTE — Plan of Care (Signed)

## 2024-09-18 NOTE — Progress Notes (Signed)
 During patient assessment found that patient has pre-existing stage 2 pressure ulcer to sacrum and left heel. Area had been cleaned and dressed by ER Nurse and orders placed by attending for dressing chang and wound care bid.

## 2024-09-18 NOTE — TOC Initial Note (Signed)
 Transition of Care [facility]) - Initial/Assessment Note    Patient Details  Name: Jordan Ward MRN: [id number] Date of Birth: 12-12-1940  [facility]) CM/SW Contact:    Ronnald MARLA Sil, RN Phone Number: 09/18/2024, 1:54 PM  Clinical Narrative:                 Patient is LTC resident of [facility] SNF, with PMH of MMPs including pulmonary fibrosis/COPD/recurrent PNA, CAD, prior CVA, Multiple Sclerosis with neurogenic bladder and atrophic right kidney requiring chronic suprapubic catheter and left percutaneous nephrostomy tube. Per H&P, the Patient with PAD and chronic heel (not a candidate for vascular intervention as he would need an aortobifemoral bypass to treat his aortoiliac occlusion). Vascular recommended transition to hospice. Of Note, patient presented for this admission with pre-existing stage 2 pressure ulcer to sacrum and left heel.   This is patient's 4th IP admission to hospital in past 7 months including current admit on 09/17/24 for Lactic Acidosis, in addition to 5/3 - 02/01/24 for Sepsis, 5/14 - 02/17/24 for LLL PNA, UTI, & CHF exac, and 10/8 - 07/11/24 for Sepsis likely r/t PNA.  Previous notes also indicate the patient is established with Oxygen  at SNF for PRN use.  CM conducted chart review, including [facility] physician communication indicating patient with failure to thrive, not eating or drinking, had elevated lactic acid, possible dehydration, as well as possible infection due to UTI, ...has chronic ulcers which are at baseline. Per documentation, the patient is currently Full code and not amendable to considering Hospice.  CM completed FL2 in anticipation of patient's eventual return to [facility].  IPCM team will continue to follow along and assist as appropriate is developing safe discharge plan.  Expected Discharge Plan: Long Term Nursing Home Barriers to Discharge: Continued Medical Work up   Patient Goals and CMS Choice Patient states their  goals for this hospitalization and ongoing recovery are:: Return to [facility] for continued long-term care CMS Medicare.gov Compare Post Acute Care list provided to:: Patient Choice offered to / list presented to : NA  Expected Discharge Plan and Services  Post Acute Care Choice: Nursing Home, Resumption of Svcs/PTA Provider Living arrangements for the past 2 months: Skilled Nursing Facility DME Arranged: N/A DME Agency: NA  Prior Living Arrangements/Services Living arrangements for the past 2 months: Skilled Nursing Facility Lives with:: Facility Resident  Activities of Daily Living   ADL Screening (condition at time of admission) Independently performs ADLs?: No Does the patient have a NEW difficulty with bathing/dressing/toileting/self-feeding that is expected to last >3 days?: No Does the patient have a NEW difficulty with getting in/out of bed, walking, or climbing stairs that is expected to last >3 days?: No Does the patient have a NEW difficulty with communication that is expected to last >3 days?: No Is the patient deaf or have difficulty hearing?: No Does the patient have difficulty seeing, even when wearing glasses/contacts?: No Does the patient have difficulty concentrating, remembering, or making decisions?: No  Permission Sought/Granted   Emotional Assessment   Admission diagnosis:  Lactic acidosis [E87.20] Suprapubic catheter (HCC) [Z93.59] Sacral decubitus ulcer, stage IV (HCC) [L89.154] Sepsis, due to unspecified organism, unspecified whether acute organ dysfunction present (HCC) [A41.9] H/O insertion of nephrostomy tube [Z98.890] Patient Active Problem List   Diagnosis Date Noted   Lactic acidosis 09/17/2024   Acute respiratory failure with hypoxia (HCC) 07/06/2024   Acute hypoxemic respiratory failure (HCC) 07/06/2024   AKI (acute kidney injury)  01/30/2024   Dyslipidemia 10/29/2023   Anxiety 10/29/2023   Flank pain 10/29/2023   Displacement of  nephrostomy tube 10/28/2023   COVID-19 09/30/2023   CKD (chronic kidney disease) stage 4, GFR 15-29 ml/min (HCC) 09/29/2023   Nephrostomy present (HCC) 09/29/2023   Emphysema lung (HCC) 09/29/2023   Right renal atrophy 07/30/2023   Hypokalemia 05/19/2022   Pressure ulcer, heel 07/14/2021   Rigors 03/07/2021   Encounter for removal of vascular catheter 09/14/2020   Pneumonia due to COVID-19 virus 07/12/2020   Bacteremia due to Gram-negative bacteria 05/04/2020   Hydronephrosis 05/02/2020   Dehydration 03/02/2020   Nausea and vomiting 03/02/2020   Ventral hernia without obstruction or gangrene 03/01/2020   Hypotension 03/01/2020   Poor appetite 03/01/2020   Detrusor areflexia 09/16/2019   Cerebrovascular disease 08/17/2018   Partial small bowel obstruction (HCC) 07/11/2017   Choledocholithiasis 07/11/2017   Paroxysmal atrial tachycardia 06/14/2017   Hypophosphatemia 06/14/2017   Pressure injury of skin 05/26/2017   Hydronephrosis due to obstruction of ureter 05/26/2017   Obstructive uropathy 05/25/2017   CKD (chronic kidney disease), stage III (HCC) 08/23/2016   Hydronephrosis of left kidney 08/23/2016   Kidney stones 09/13/2015   Essential hypertension    Pressure ulcer 08/04/2015   Chronic diastolic (congestive) heart failure (HCC) 08/04/2015   Hydronephrosis with obstructing calculus 08/04/2015   Occult blood positive stool 09/05/2014   Edema 08/19/2014   Dilated cardiomyopathy (HCC) 07/21/2014   Headache 03/13/2014   Cerebral infarction (HCC) 03/13/2014   Abnormality of gait 03/13/2014   Tremors of nervous system 10/08/2011   Hypothyroidism 10/08/2011   Pulmonary nodule 10/08/2011   Dysphagia 10/07/2011   HTN (hypertension), malignant 10/06/2011   Chronic suprapubic catheter (HCC) 10/06/2011   Pulmonary fibrosis (HCC) 10/06/2011   UTI (urinary tract infection) 10/03/2011   Junctional rhythm 10/02/2011   Multiple sclerosis 10/02/2011   Chronic diarrhea 06/17/2011    Hx of adenomatous colonic polyps 06/17/2011   High grade dysplasia in colonic adenoma 09/29/2005   PCP:  Shona Norleen PEDLAR, MD Pharmacy:   [facility] - [place], [place] - [address] [place] [address] Phone: [phone] Fax: [phone]  [facility]  - [place], [place] - [address] [place] [address] Phone: [phone] Fax: [phone]  Social Drivers of Health (SDOH) Social History: SDOH Screenings   Food Insecurity: No Food Insecurity (09/17/2024)  Housing: Low Risk (09/17/2024)  Transportation Needs: No Transportation Needs (09/17/2024)  Utilities: Not At Risk (09/17/2024)  Depression (PHQ2-9): Low Risk (09/26/2022)  Financial Resource Strain: Low Risk (11/03/2023)   Received from [facility]  Physical Activity: Inactive (11/03/2023)   Received from [facility]  Social Connections: Moderately Isolated (09/17/2024)  Stress: No Stress Concern Present (11/03/2023)   Received from [facility]  Tobacco Use: Medium Risk (07/06/2024)  Health Literacy: High Risk (11/03/2023)   Received from [facility]   SDOH Interventions:     Readmission Risk Interventions    09/18/2024    1:48 PM 07/07/2024    3:27 PM 10/12/2023   11:35 AM  Readmission Risk Prevention Plan  Transportation Screening Complete Complete Complete  Home Care Screening   Complete  Medication Review (RN CM)   Complete  Medication Review Oceanographer) Complete Complete   PCP or Specialist appointment within 3-5 days of discharge Complete Not Complete   HRI or Home Care Consult  Complete   SW Recovery Care/Counseling Consult  Complete   Palliative Care Screening Complete Not Applicable   Skilled Nursing Facility Complete Complete

## 2024-09-18 NOTE — [place] FL2 (Addendum)
 " [place]  MEDICAID [id number] LEVEL OF CARE FORM     IDENTIFICATION  Patient Name: STCLAIR SZYMBORSKI Birthdate: March 27, 1941 Sex: male Admission Date (Current Location): 09/17/2024  [facility] and [place] Number:  [employer] and Address:  [facility],  [address], [place] [address]      Provider Number: [id number]  Attending Physician Name and Address:  Maree Adron BIRCH, DO  Relative Name and Phone Number:  Travis, Mastel  Spouse, [phone]    Current Level of Care: Hospital Recommended Level of Care: Skilled Nursing Facility Prior Approval Number:    Date Approved/Denied:   PASRR Number: [id number]  Discharge Plan: SNF    Current Diagnoses: Patient Active Problem List   Diagnosis Date Noted   Lactic acidosis 09/17/2024   Acute respiratory failure with hypoxia (HCC) 07/06/2024   Acute hypoxemic respiratory failure (HCC) 07/06/2024   AKI (acute kidney injury) 01/30/2024   Dyslipidemia 10/29/2023   Anxiety 10/29/2023   Flank pain 10/29/2023   Displacement of nephrostomy tube 10/28/2023   COVID-19 09/30/2023   CKD (chronic kidney disease) stage 4, GFR 15-29 ml/min (HCC) 09/29/2023   Nephrostomy present (HCC) 09/29/2023   Emphysema lung (HCC) 09/29/2023   Right renal atrophy 07/30/2023   Hypokalemia 05/19/2022   Pressure ulcer, heel 07/14/2021   Rigors 03/07/2021   Encounter for removal of vascular catheter 09/14/2020   Pneumonia due to COVID-19 virus 07/12/2020   Bacteremia due to Gram-negative bacteria 05/04/2020   Hydronephrosis 05/02/2020   Dehydration 03/02/2020   Nausea and vomiting 03/02/2020   Ventral hernia without obstruction or gangrene 03/01/2020   Hypotension 03/01/2020   Poor appetite 03/01/2020   Det[place]or areflexia 09/16/2019   Cerebrovascular disease 08/17/2018   Partial small bowel obstruction (HCC) 07/11/2017   Choledocholithiasis 07/11/2017   Paroxysmal atrial tachycardia 06/14/2017   Hypophosphatemia 06/14/2017   Pressure  injury of skin 05/26/2017   Hydronephrosis due to obstruction of ureter 05/26/2017   Obstructive uropathy 05/25/2017   CKD (chronic kidney disease), stage III (HCC) 08/23/2016   Hydronephrosis of left kidney 08/23/2016   Kidney stones 09/13/2015   Essential hypertension    Pressure ulcer 08/04/2015   Chronic diastolic (congestive) heart failure (HCC) 08/04/2015   Hydronephrosis with obstructing calculus 08/04/2015   Occult blood positive stool 09/05/2014   Edema 08/19/2014   Dilated cardiomyopathy (HCC) 07/21/2014   Headache 03/13/2014   Cerebral infarction (HCC) 03/13/2014   Abnormality of gait 03/13/2014   Tremors of nervous system 10/08/2011   Hypothyroidism 10/08/2011   Pulmonary nodule 10/08/2011   Dysphagia 10/07/2011   HTN (hypertension), malignant 10/06/2011   Chronic suprapubic catheter (HCC) 10/06/2011   Pulmonary fibrosis (HCC) 10/06/2011   UTI (urinary tract infection) 10/03/2011   Junctional rhythm 10/02/2011   Multiple sclerosis 10/02/2011   Chronic diarrhea 06/17/2011   Hx of adenomatous colonic polyps 06/17/2011   High grade dysplasia in colonic adenoma 09/29/2005    Orientation RESPIRATION BLADDER Height & Weight     Self, Time, Place, Situation  O2 (Currently on O2 4L via [place]) Indwelling catheter, Urostomy (Chronic Suprapubic Cath and Nephrostomy Tube) Weight: 45.4 kg Height:  5' 5 (165.1 cm)  BEHAVIORAL SYMPTOMS/MOOD NEUROLOGICAL BOWEL NUTRITION STATUS      Continent Diet  AMBULATORY STATUS COMMUNICATION OF NEEDS Skin   Extensive Assist Verbally Other (Comment) (Redness to bilateral Buttocks and Left Heel)                       Personal Care Assistance  Level of Assistance  Bathing, Feeding, Dressing Bathing Assistance: Maximum assistance Feeding assistance: Limited assistance Dressing Assistance: Maximum assistance     Functional Limitations Info  Hearing, Sight Sight Info: Impaired (Wears glasses) Hearing Info: Impaired      SPECIAL CARE  FACTORS FREQUENCY  PT (By licensed PT)                    Contractures      Additional Factors Info                  Current Medications (09/18/2024):  This is the current hospital active medication list Current Facility-Administered Medications  Medication Dose Route Frequency Provider Last Rate Last Admin   (feeding supplement) PROSource Plus liquid 30 mL  30 mL Oral BID Elgergawy, Dawood S, MD   30 mL at 09/18/24 0828   0.9 %  sodium chloride  infusion   Intravenous Continuous Elgergawy, Dawood S, MD 50 mL/hr at 09/17/24 1732 New Bag at 09/17/24 1732   acetaminophen  (TYLENOL ) tablet 650 mg  650 mg Oral Q6H PRN Elgergawy, Dawood S, MD       Or   acetaminophen  (TYLENOL ) suppository 650 mg  650 mg Rectal Q6H PRN Elgergawy, Dawood S, MD       albuterol  (PROVENTIL ) (2.5 MG/3ML) 0.083% nebulizer solution 2.5 mg  2.5 mg Nebulization Q2H PRN Elgergawy, Dawood S, MD       ascorbic acid  (VITAMIN C ) tablet 500 mg  500 mg Oral q morning Elgergawy, Dawood S, MD   500 mg at 09/18/24 9178   aspirin  chewable tablet 81 mg  81 mg Oral Q breakfast Elgergawy, Dawood S, MD   81 mg at 09/18/24 9178   azithromycin  (ZITHROMAX ) 500 mg in sodium chloride  0.9 % 250 mL IVPB  500 mg Intravenous Q24H Elgergawy, Dawood S, MD 250 mL/hr at 09/18/24 0820 500 mg at 09/18/24 0820   busPIRone  (BUSPAR ) tablet 5 mg  5 mg Oral TID Elgergawy, Dawood S, MD   5 mg at 09/18/24 9178   cefTRIAXone  (ROCEPHIN ) 2 g in sodium chloride  0.9 % 100 mL IVPB  2 g Intravenous Q24H Elgergawy, Dawood S, MD 200 mL/hr at 09/17/24 2031 2 g at 09/17/24 2031   diazepam  (VALIUM ) tablet 2 mg  2 mg Oral BID Elgergawy, Dawood S, MD   2 mg at 09/18/24 9176   heparin  injection 5,000 Units  5,000 Units Subcutaneous Q8H Elgergawy, Dawood S, MD   5,000 Units at 09/18/24 0501   HYDROcodone -acetaminophen  (NORCO/VICODIN) 5-325 MG per tablet 1 tablet  1 tablet Oral Q8H PRN Elgergawy, Dawood S, MD   1 tablet at 09/17/24 2027   HYDROmorphone  (DILAUDID )  injection 0.5 mg  0.5 mg Intravenous Q4H PRN Maree, Pratik D, DO       levothyroxine  (SYNTHROID ) tablet 75 mcg  75 mcg Oral Q0600 Elgergawy, Dawood S, MD   75 mcg at 09/18/24 0502   melatonin tablet 3 mg  3 mg Oral QHS Elgergawy, Dawood S, MD   3 mg at 09/17/24 2026   methocarbamol  (ROBAXIN ) tablet 500 mg  500 mg Oral Q6H PRN Elgergawy, Dawood S, MD       mirtazapine  (REMERON ) tablet 15 mg  15 mg Oral QHS Elgergawy, Dawood S, MD   15 mg at 09/17/24 2027   multivitamin with minerals tablet 1 tablet  1 tablet Oral q morning Elgergawy, Dawood S, MD   1 tablet at 09/18/24 0830   oxybutynin  (DITROPAN ) tablet 5 mg  5 mg  Oral BID Elgergawy, Dawood S, MD   5 mg at 09/18/24 9178   pantoprazole  (PROTONIX ) EC tablet 40 mg  40 mg Oral Daily Elgergawy, Dawood S, MD   40 mg at 09/18/24 0822   polyethylene glycol (MIRALAX  / GLYCOLAX ) packet 17 g  17 g Oral q morning Elgergawy, Dawood S, MD   17 g at 09/18/24 9171   senna-docusate (Senokot-S) tablet 2 tablet  2 tablet Oral QHS Elgergawy, Dawood S, MD   2 tablet at 09/17/24 2026   thiamine  (VITAMIN B1) tablet 100 mg  100 mg Oral Daily Elgergawy, Dawood S, MD   100 mg at 09/18/24 9178     Discharge Medications: Please see discharge summary for a list of discharge medications.  Relevant Imaging Results:  Relevant Lab Results:   Additional Information  Patient's SSN: [id number]  Ronnald MARLA Sil, RN     "

## 2024-09-18 NOTE — Progress Notes (Signed)
 " PROGRESS NOTE    Jordan Ward  FMW:[id number] DOB: January 03, 1941 DOA: 09/17/2024 PCP: Shona Norleen PEDLAR, MD   Brief Narrative:     Jordan Ward  is a 83 y.o. male, with medical history significant of pulmonary fibrosis/COPD, CAD, prior stroke, multiple sclerosis with neurogenic bladder, has had a longstanding suprapubic tube with atrophic right kidney and a left percutaneous nephrostomy tube, hypothyroidism, pressure ulcers, pneumonia, long-term resident at [facility].  Patient with PAD, chronic heel wounds (not a candidate for vascular intervention as he would need an aortobifemoral bypass to treat his aortoiliac occlusion). Vascular recommended transition to hospice.    - Patient was sent by his facility, as he was thought to be dehydrated and they could not get an IV access on him to initiate IV fluids, patient with poor appetite, poor oral intake over the last few days, where they thought to initiate IV fluids on him but could not get an IV access so he was sent to ED for further evaluation, patient reports poor appetite, he is on mirtazapine , reviewing records his nephrostomy exchange by IR 09/08/2024, but it does appear suprapubic catheter has not been changed in several months and this was changed by EDP.  Assessment & Plan:   Principal Problem:   Lactic acidosis Active Problems:   Cerebrovascular disease   Chronic diastolic (congestive) heart failure (HCC)   Essential hypertension   CKD (chronic kidney disease) stage 4, GFR 15-29 ml/min (HCC)   Nephrostomy present (HCC)   Pressure ulcer  Assessment and Plan:   Lactic acidosis-resolved Dehydration Pneumonia Possible UTI -Volume depletion versus infectious process -Continue with IV fluids, trending down with hydration - Possible infectious due to UTI versus pneumonia. - Chest x-ray significant for left lung opacity, will start on Rocephin  and azithromycin  - History of dysphagia, reports he is on dysphagia 1 with thick liquid,  will consult SLP. - Possible urine infection, suprapubic Foley catheter exchanged in ED, UA is pending, but meanwhile continue with Rocephin . - Received broad-spectrum antibiotics - Follow-up blood cultures with no growth noted and urine cultures pending - Continue with IV fluids     AKI on stage IIIb CKD:  - Baseline creatinine 1.9, currently 2.18, continue with IV fluids     Chronic combined HFrEF  HTN -2D echo January 2025 LVEF was 30 - 35%. - Normotensive, no significant evidence of volume overload    Neurogenic bladder, obstructive uropathy, chronic percutaneous nephrostomy tube:  - On UA . - Prepubic catheter exchanged in ED . - Nephrostomy tube exchanged by IR 09/08/2024      COPD, pulmonary fibrosis, chronic hypoxic respiratory failure:  - On oxygen  as needed - No wheezing or respiratory distress - Continue scheduled DuoNebs and as needed albuterol    GERD - Continue with PPI   Hypothyroidism:   - Continue levothyroxine    Deconditioning/multiple sclerosis Failure to thrive Protein calorie malnutrition - Continue with supplement. - Will consult nutritionist   Multiple pressure ulcers, chronic, POA Sacral, right and left buttocks pressure ulcers , left heel ulcer. - Chronic, do not appear to be infected, will consult wound care    PAD -not a candidate for any surgical intervention, vascular surgery recommended hospice. - Continue with aspirin  -Continue pain management with hydrocodone  as well as Dilaudid     DVT prophylaxis:Heparin  Code Status: Full Family Communication: None at bedside Disposition Plan:  Status is: Inpatient Remains inpatient appropriate because: Need for IV medications.   Consultants:  Palliative care  Procedures:  None  Antimicrobials:  Anti-infectives (From admission, onward)    Start     Dose/Rate Route Frequency Ordered Stop   09/18/24 0800  azithromycin  (ZITHROMAX ) 500 mg in sodium chloride  0.9 % 250 mL IVPB        500  mg 250 mL/hr over 60 Minutes Intravenous Every 24 hours 09/17/24 1629     09/17/24 2200  cefTRIAXone  (ROCEPHIN ) 2 g in sodium chloride  0.9 % 100 mL IVPB        2 g 200 mL/hr over 30 Minutes Intravenous Every 24 hours 09/17/24 1629     09/17/24 1530  ceFEPIme  (MAXIPIME ) 2 g in sodium chloride  0.9 % 100 mL IVPB        2 g 200 mL/hr over 30 Minutes Intravenous  Once 09/17/24 1525 09/17/24 1647   09/17/24 1530  metroNIDAZOLE  (FLAGYL ) IVPB 500 mg        500 mg 100 mL/hr over 60 Minutes Intravenous  Once 09/17/24 1525 09/17/24 1721   09/17/24 1530  vancomycin  (VANCOCIN ) IVPB 1000 mg/200 mL premix        1,000 mg 200 mL/hr over 60 Minutes Intravenous  Once 09/17/24 1525 09/17/24 1726       Subjective: Patient seen and evaluated today with ongoing complaints of pain in both feet.  No acute overnight events noted.  Objective: Vitals:   09/17/24 1831 09/17/24 2159 09/18/24 0231 09/18/24 0609  BP: 112/87 (!) 126/90 (!) 142/88 112/73  Pulse: 97 73 72 66  Resp: 20 20 16 16   Temp: 97.8 F (36.6 C) 97.8 F (36.6 C) 98.1 F (36.7 C) 98.2 F (36.8 C)  TempSrc: Axillary Oral Oral Oral  SpO2: 100% 100% 98% 100%  Weight:      Height:        Intake/Output Summary (Last 24 hours) at 09/18/2024 0924 Last data filed at 09/18/2024 0500 Gross per 24 hour  Intake 200 ml  Output 525 ml  Net -325 ml   Filed Weights   09/17/24 1329  Weight: 45.4 kg    Examination:  General exam: Appears calm and comfortable  Respiratory system: Clear to auscultation. Respiratory effort normal. Cardiovascular system: S1 & S2 heard, RRR.  Gastrointestinal system: Abdomen is soft Central nervous system: Alert and awake Extremities: Bilateral lower extremity wounds noted. Skin: No significant lesions noted Psychiatry: Flat affect.    Data Reviewed: I have personally reviewed following labs and imaging studies  CBC: Recent Labs  Lab 09/17/24 1427 09/18/24 0503  WBC 9.7 8.1  NEUTROABS 6.2  --    HGB 9.6* 9.0*  HCT 32.5* 31.0*  MCV 95.0 96.9  PLT 357 285   Basic Metabolic Panel: Recent Labs  Lab 09/17/24 1427 09/18/24 0503  NA 138 141  K 5.1 4.9  CL 104 110  CO2 20* 24  GLUCOSE 118* 102*  BUN 29* 28*  CREATININE 2.22* 2.18*  CALCIUM  8.5* 7.9*   GFR: Estimated Creatinine Clearance: 17.1 mL/min (A) (by C-G formula based on SCr of 2.18 mg/dL (H)). Liver Function Tests: Recent Labs  Lab 09/17/24 1427  AST 16  ALT <5  ALKPHOS 137*  BILITOT 0.3  PROT 7.2  ALBUMIN 3.0*   No results for input(s): LIPASE, AMYLASE in the last 168 hours. No results for input(s): AMMONIA in the last 168 hours. Coagulation Profile: Recent Labs  Lab 09/17/24 1427  INR 1.0   Cardiac Enzymes: No results for input(s): CKTOTAL, CKMB, CKMBINDEX, TROPONINI in the last 168 hours. BNP (last 3 results) Recent Labs  07/06/24 1445  PROBNP 21,393.0*   HbA1C: No results for input(s): HGBA1C in the last 72 hours. CBG: No results for input(s): GLUCAP in the last 168 hours. Lipid Profile: No results for input(s): CHOL, HDL, LDLCALC, TRIG, CHOLHDL, LDLDIRECT in the last 72 hours. Thyroid  Function Tests: No results for input(s): TSH, T4TOTAL, FREET4, T3FREE, THYROIDAB in the last 72 hours. Anemia Panel: No results for input(s): VITAMINB12, FOLATE, FERRITIN, TIBC, IRON, RETICCTPCT in the last 72 hours. Sepsis Labs: Recent Labs  Lab 09/17/24 1427 09/17/24 1535 09/18/24 0743  LATICACIDVEN 3.8* 2.0* 1.7    Recent Results (from the past 240 hours)  Blood Culture (routine x 2)     Status: None (Preliminary result)   Collection Time: 09/17/24  2:27 PM   Specimen: BLOOD LEFT FOREARM  Result Value Ref Range Status   Specimen Description   Final    BLOOD LEFT FOREARM BOTTLES DRAWN AEROBIC AND ANAEROBIC   Special Requests Blood Culture adequate volume  Final   Culture   Final    NO GROWTH < 24 HOURS Performed at [facility], [address] [place], [place] [address]    Report Status PENDING  Incomplete  Blood Culture (routine x 2)     Status: None (Preliminary result)   Collection Time: 09/17/24  2:27 PM   Specimen: BLOOD RIGHT ARM  Result Value Ref Range Status   Specimen Description   Final    BLOOD RIGHT ARM BOTTLES DRAWN AEROBIC AND ANAEROBIC   Special Requests Blood Culture adequate volume  Final   Culture   Final    NO GROWTH < 24 HOURS Performed at [facility], [address], [place], [place] [address]    Report Status PENDING  Incomplete         Radiology Studies: DG Chest Port 1 View Result Date: 09/17/2024 CLINICAL DATA:  Provided history: Questionable sepsis - evaluate for abnormality EXAM: PORTABLE CHEST 1 VIEW COMPARISON:  Radiograph 07/06/2024, CT 07/10/2020 FINDINGS: Interstitial coarsening with bronchial thickening, chronic but increased in degree from prior exam. Emphysema with question of basilar fibrosis. Ill-defined patchy opacity at the left lung base. The heart is prominent size but stable. Blunting of the costophrenic angles, possible small effusions. No pneumothorax. Chronic right shoulder arthropathy. IMPRESSION: 1. Ill-defined patchy opacity at the left lung base, atelectasis versus pneumonia. 2. Chronic interstitial coarsening with bronchial thickening, increased in degree from prior exam. This may be infectious/inflammatory. 3. Emphysema with question of basilar fibrosis. 4. Possible small pleural effusions. Electronically Signed   By: Andrea Gasman M.D.   On: 09/17/2024 14:56        Scheduled Meds:  (feeding supplement) PROSource Plus  30 mL Oral BID   ascorbic acid   500 mg Oral q morning   aspirin   81 mg Oral Q breakfast   busPIRone   5 mg Oral TID   diazepam   2 mg Oral BID   heparin   5,000 Units Subcutaneous Q8H   levothyroxine   75 mcg Oral Q0600   melatonin  3 mg Oral QHS   mirtazapine   15 mg Oral QHS   multivitamin with minerals  1 tablet Oral q morning   oxybutynin   5  mg Oral BID   pantoprazole   40 mg Oral Daily   polyethylene glycol  17 g Oral q morning   senna-docusate  2 tablet Oral QHS   thiamine   100 mg Oral Daily   Continuous Infusions:  sodium chloride  50 mL/hr at 09/17/24 1732   azithromycin  500 mg (  09/18/24 0820)   cefTRIAXone  (ROCEPHIN )  IV 2 g (09/17/24 2031)     LOS: 1 day    Time spent: 55 minutes    Vashawn Ekstein D Maree, DO [facility]  If 7PM-7AM, please contact night-coverage [email or web address] 09/18/2024, 9:24 AM   "

## 2024-09-18 NOTE — Evaluation (Signed)
 Clinical/Bedside Swallow Evaluation Patient Details  Name: Jordan Ward MRN: [id number] Date of Birth: [birth year]  Today's Date: 09/18/2024 Time: SLP Start Time (ACUTE ONLY): 1101 SLP Stop Time (ACUTE ONLY): 1126 SLP Time Calculation (min) (ACUTE ONLY): 25 min  Past Medical History:  Past Medical History:  Diagnosis Date   Anemia    Arthritis    Back pain, chronic    Bilateral carotid bruits    C. difficile colitis 09/2011   CAD (coronary artery disease)    Carotid artery stenosis    Cerebrovascular disease    Cerebrovascular disease 08/17/2018   Colon polyps    Complication of cystostomy catheter, initial encounter 04/20/2020   Dyslipidemia    Dysphagia 10/07/2011   Encephalopathy    Gait disorder    HA (headache)    High grade dysplasia in colonic adenoma 09/2005   History of kidney stones    HTN (hypertension), malignant 10/06/2011   Hypernatremia    Hypokalemia    Hypothyroidism 10/08/2011   Insomnia    Junctional rhythm    Kidney stones    MS (multiple sclerosis)    Nausea and vomiting 03/02/2020   Neuromuscular disorder (HCC)    MS   OSA (obstructive sleep apnea)    Paroxysmal atrial tachycardia    Peripheral vascular disease    Pneumonia 4 yrs ago   Pulmonary fibrosis (HCC) 10/06/2011   Pulmonary nodule 10/08/2011   PVD (peripheral vascular disease)    Sacral ulcer (HCC)    Sleep apnea    cannot tolerate   Stroke (HCC)    left sided weakness   Suprapubic catheter (HCC)    TIA (transient ischemic attack)    Tremors of nervous system 10/08/2011   Urinary tract infection    Ventral hernia without obstruction or gangrene     Large 8X9cm ventral hernia with loss of domain. CT reads report as diastasis recti with herniation or diastasis recti.  Dr. Kallie, Surgery, reviewed CT with radiology and there is herniation with only hernia sac or peritoneum over the bowel and large separation of the rectus muslce (i.e. diastasis recti aka loss of domain).  No  surgical intervention recommended given size, age, and health.    Past Surgical History:  Past Surgical History:  Procedure Laterality Date   APPENDECTOMY  09/2005   at time of left hemicolectomy   BACK SURGERY  1976/1979   lower   BILIARY DILATION N/A 03/03/2020   Procedure: BILIARY DILATION;  Surgeon: Golda Claudis PENNER, MD;  [facility];  Service: Endoscopy;  Laterality: N/A;   CATARACT EXTRACTION W/PHACO Right 03/08/2018   Procedure: CATARACT EXTRACTION PHACO AND INTRAOCULAR LENS PLACEMENT RIGHT EYE;  Surgeon: Perley Hamilton, MD;  Location: [facility];  Service: Ophthalmology;  Laterality: Right;  CDE: 8.86   CATARACT EXTRACTION W/PHACO Left 04/05/2018   Procedure: CATARACT EXTRACTION PHACO AND INTRAOCULAR LENS PLACEMENT (IOC);  Surgeon: Perley Hamilton, MD;  Location: [facility];  Service: Ophthalmology;  Laterality: Left;  CDE: 7.36   CENTRAL LINE INSERTION-TUNNELED Right 09/11/2020   Procedure: PLACEMENT OF TUNNELED CENTRAL LINE INTO JUGULAR VEIN;  Surgeon: Kallie Manuelita BROCKS, MD;  Location: [facility];  Service: General;  Laterality: Right;   CHOLECYSTECTOMY     Dr. Claudene   COLON SURGERY  09/2005   Fleishman: four tubular adenomas, large adenomatous polyp with HIGH GRADE dysplasia   COLONOSCOPY  11/2004   Dr. Lendon sessile polyp splenic flexure, 10mm sessile polyp desc colon, tubulovillous adenoma (bx not removed)  COLONOSCOPY  01/2005   poor prep, polyp could not be found   COLONOSCOPY  05/2005   with EMR, polypectomy Dr. Norleen Dec, bx showed high grade dysplasia, partially resected   COLONOSCOPY  09/2005   Dr. Dec, [place] ink tattooing, four villous colon polyp (3 had been missed on previous colonoscopies due to limitations of procedures   COLONOSCOPY  09/2006   normal TI, no polyps   COLONOSCOPY  10/2007   Dr. Randol distal mammillations, benign bx, normal TI, random bx neg for microscopic colitis   CYSTOSCOPY W/ URETERAL STENT PLACEMENT Left 09/05/2020   Procedure:  CYSTOSCOPY WITH RETROGRADE PYELOGRAM/URETERAL STENT PLACEMENT;  Surgeon: Cam Morene ORN, MD;  [facility]: [facility];  Service: Urology;  Laterality: Left;   CYSTOSCOPY WITH LITHOLAPAXY N/A 07/27/2018   Procedure: CYSTOSCOPY WITH LITHOLAPAXY VIA  SUPRAPUBIC TUBE;  Surgeon: Matilda Senior, MD;  Location: [facility];  Service: Urology;  Laterality: N/A;   CYSTOSCOPY WITH RETROGRADE PYELOGRAM, URETEROSCOPY AND STENT PLACEMENT Left 06/09/2017   Procedure: CYSTOSCOPY WITH LEFT RETROGRADE PYELOGRAM, LEFT URETEROSCOPY, LEFT URETEROSCOPIC STONE EXTRACTION, LEFT URETERAL STENT PLACEMENT;  Surgeon: Matilda Senior, MD;  Location: [facility];  Service: Urology;  Laterality: Left;   CYSTOSCOPY/URETEROSCOPY/HOLMIUM LASER/STENT PLACEMENT Left 05/22/2020   Procedure: CYSTOSCOPY/URETEROSCOPY/STENT PLACEMENT;  Surgeon: Matilda Senior, MD;  [facility]: [facility];  Service: Urology;  Laterality: Left;   ERCP N/A 03/03/2020   Procedure: ENDOSCOPIC RETROGRADE CHOLANGIOPANCREATOGRAPHY (ERCP);  Surgeon: Golda Claudis PENNER, MD;  [facility];  Service: Endoscopy;  Laterality: N/A;   ERCP N/A 04/20/2020   Procedure: ENDOSCOPIC RETROGRADE CHOLANGIOPANCREATOGRAPHY (ERCP);  Surgeon: Golda Claudis PENNER, MD;  [facility];  Service: Endoscopy;  Laterality: N/A;  to be done at 7:30am in OR   GASTROINTESTINAL STENT REMOVAL N/A 04/20/2020   Procedure: STENT REMOVAL;  Surgeon: Golda Claudis PENNER, MD;  [facility];  Service: Endoscopy;  Laterality: N/A;   INGUINAL HERNIA REPAIR  1971   bilateral   INSERTION OF SUPRAPUBIC CATHETER  06/09/2017   Procedure: EXCHANGE OF SUPRAPUBIC CATHETER;  Surgeon: Matilda Senior, MD;  [facility]: [facility];  Service: Urology;;   INSERTION OF SUPRAPUBIC CATHETER  05/22/2020   Procedure: SUPRAPUBIC CATHETER EXCHANGE;  Surgeon: Matilda Senior, MD;  [facility]: [facility];  Service: Urology;;   IR NEPHROSTOMY EXCHANGE LEFT  03/25/2021   IR NEPHROSTOMY EXCHANGE LEFT  05/20/2021   IR  NEPHROSTOMY EXCHANGE LEFT  07/13/2021   IR NEPHROSTOMY EXCHANGE LEFT  09/09/2021   IR NEPHROSTOMY EXCHANGE LEFT  11/04/2021   IR NEPHROSTOMY EXCHANGE LEFT  12/23/2021   IR NEPHROSTOMY EXCHANGE LEFT  02/17/2022   IR NEPHROSTOMY EXCHANGE LEFT  04/14/2022   IR NEPHROSTOMY EXCHANGE LEFT  06/09/2022   IR NEPHROSTOMY EXCHANGE LEFT  07/28/2022   IR NEPHROSTOMY EXCHANGE LEFT  09/09/2022   IR NEPHROSTOMY EXCHANGE LEFT  11/04/2022   IR NEPHROSTOMY EXCHANGE LEFT  12/30/2022   IR NEPHROSTOMY EXCHANGE LEFT  02/25/2023   IR NEPHROSTOMY EXCHANGE LEFT  03/31/2023   IR NEPHROSTOMY EXCHANGE LEFT  05/26/2023   IR NEPHROSTOMY EXCHANGE LEFT  07/21/2023   IR NEPHROSTOMY EXCHANGE LEFT  09/15/2023   IR NEPHROSTOMY EXCHANGE LEFT  12/22/2023   IR NEPHROSTOMY EXCHANGE LEFT  03/25/2024   IR NEPHROSTOMY EXCHANGE LEFT  04/28/2024   IR NEPHROSTOMY EXCHANGE LEFT  06/02/2024   IR NEPHROSTOMY EXCHANGE LEFT  09/08/2024   IR NEPHROSTOMY PLACEMENT LEFT  05/26/2017   IR NEPHROSTOMY PLACEMENT LEFT  05/02/2020   IR NEPHROSTOMY PLACEMENT LEFT  02/15/2021  IR NEPHROSTOMY PLACEMENT LEFT  02/22/2024   IR NEPHROSTOMY TUBE CHANGE  10/29/2023   IR URETERAL STENT PERC REMOVAL MOD SED  03/25/2021   KIDNEY STONE SURGERY  09/13/2015   LITHOTRIPSY N/A 03/03/2020   Procedure: MECHANICAL LITHOTRIPSY WITH REMOVAL OF MULTIPLE STONE FRAGMENTS;  Surgeon: Golda Claudis PENNER, MD;  [facility];  Service: Endoscopy;  Laterality: N/A;   NEPHROLITHOTOMY Left 09/13/2015   Procedure: LEFT PERCUTANEOUS NEPHROLITHOTOMY ;  Surgeon: Garnette Shack, MD;  Location: [facility];  Service: Urology;  Laterality: Left;   NEPHROSTOMY TUBE REMOVAL  05/22/2020   Procedure: NEPHROSTOMY TUBE REMOVAL;  Surgeon: Shack Garnette, MD;  Location: [facility];  Service: Urology;;   [facility] REMOVAL Right 09/20/2020   Procedure: MINOR REMOVAL CENTRAL LINE;  Surgeon: Kallie Manuelita BROCKS, MD;  Location: [facility];  Service: General;  Laterality: Right;  Pt to arrive at 7:30am for procedure    REMOVAL OF STONES N/A 04/20/2020   Procedure: REMOVAL OF STONES;  Surgeon: Golda Claudis PENNER, MD;  [facility];  Service: Endoscopy;  Laterality: N/A;   SPHINCTEROTOMY  03/03/2020   Procedure: BILLARY SPHINCTEROTOMY;  Surgeon: Golda Claudis PENNER, MD;  [facility];  Service: Endoscopy;;   SUPRAPUBIC CATHETER INSERTION     HPI:  Nora Rooke  is a 83 y.o. male, with medical history significant of pulmonary fibrosis/COPD, CAD, prior stroke, multiple sclerosis with neurogenic bladder, has had a longstanding suprapubic tube with atrophic right kidney and a left percutaneous nephrostomy tube, hypothyroidism, pressure ulcers, pneumonia, long-term resident at [facility].  Patient with PAD, chronic heel wounds (not a candidate for vascular intervention as he would need an aortobifemoral bypass to treat his aortoiliac occlusion). Vascular recommended transition to hospice.      - Patient was sent by his facility, as he was thought to be dehydrated and they could not get an IV access on him to initiate IV fluids, patient with poor appetite, poor oral intake over the last few days, where they thought to initiate IV fluids on him but could not get an IV access so he was sent to ED for further evaluation, patient reports poor appetite, he is on mirtazapine , reviewing records his nephrostomy exchange by IR 09/08/2024, but it does appear suprapubic catheter has not been changed in several months and this was changed by EDP.  BSE requested. Pt is known to SLP service from previous dysphagia history and known zenker's diverticulum.   Chest X-ray: IMPRESSION: 1. Ill-defined patchy opacity at the left lung base, atelectasis versus pneumonia. 2. Chronic interstitial coarsening with bronchial thickening, increased in degree from prior exam. This may be infectious/inflammatory. 3. Emphysema with question of basilar fibrosis. 4. Possible small pleural effusions.  BSE from October 2025: <<Clinical  swallow evaluation completed with Pt seated in reclining chair. Pt is known to this SLP from previous dysphagia intervention and last MBSS in 02/2022. Pt has been placed on and off thickened liquids since 2016 and he has made it clear that he does not want to be on thickened liquids and usually drinks thin once discharged. His last imaging in 2023 showed Zenker's diverticulum which filled with all consistencies and then had some degree of backflow into the pharynx which can be aspirated. There was no significant difference in thickened and thin liquids when this occurred. Pt was at a greater risk of aspiration of thin liquids during the swallow, but once he was given thickened liquids- he was at risk for aspirating those AFTER the  swallow due to backflow. Pt assessed with ice chips, thin water , puree, and regular textures this date. He had one episode of delayed coughing after straw sips thin water . Recommend D3/mech soft and thin liquids via small sips when Pt is alert and upright, multiple swallows to clear and he should periodically cough/clear his throat and repeat swallow, PO medication crush large pills as able in puree. Pt has not pursued having his Zenker's diverticulum repaired. He could consider this to decrease risks of aspiration. No further SLP services indicated at this time.>>   Assessment / Plan / Recommendation  Clinical Impression  Clinical swallow evaluation completed at bedside in Pt with known h/o dysphagia (see above). Pt indicates that he has been gettting thickened liquids for the past few months and soft solids. He reports not liking the liquids when they are too thick and needs softer foods due to loose teeth. Pt did present with immediate cough after his first straw sip of thin water , however cough eliminated when cued to take very small sips. He was assessed with ice chips, thin water , NTL, HTL, puree, and regular textures. Occasional cough elicited, however vocal quality remains  strong. SLP reviewed previous swallow study findings of zenker's diverticulum and risk for aspiration either prandially or post prandially due to same. Pt voiced that he feels most comfortable drinking the nectar-thick liquids and soft solids and acknowledges needs to take small sips and periodic throat clear. Recommend D3/mech soft and nectar-thick liquids  Crush pills as able or whole in puree    Aspiration Risk  Moderate aspiration risk    Diet Recommendation           Other Recommendations Oral Care Recommendations: Oral care BID;Oral care prior to ice chip/H20     Swallow Evaluation Recommendations Recommendations: PO diet PO Diet Recommendation: Dysphagia 3 (Mechanical soft);Thin liquids (Level 0) Liquid Administration via: Cup;Straw Medication Administration: Whole meds with puree Supervision: Intermittent supervision/cueing for swallowing strategies;Staff to assist with self-feeding Swallowing strategies  : Slow rate;Small bites/sips;Multiple dry swallows after each bite/sip;Clear throat intermittently Postural changes: Position pt fully upright for meals;Stay upright 30-60 min after meals Oral care recommendations: Oral care BID (2x/day);Staff/trained caregiver to provide oral care   Assistance Recommended at Discharge    Functional Status Assessment Patient has had a recent decline in their functional status and demonstrates the ability to make significant improvements in function in a reasonable and predictable amount of time.  Frequency and Duration            Prognosis Prognosis for improved oropharyngeal function: Fair Barriers to Reach Goals: Severity of deficits Barriers/Prognosis Comment: Previous fidnding of Zenker's diverticulum      Swallow Study   General Date of Onset: 09/17/24 HPI: Neldon Shepard  is a 83 y.o. male, with medical history significant of pulmonary fibrosis/COPD, CAD, prior stroke, multiple sclerosis with neurogenic bladder, has had a longstanding  suprapubic tube with atrophic right kidney and a left percutaneous nephrostomy tube, hypothyroidism, pressure ulcers, pneumonia, long-term resident at [facility].  Patient with PAD, chronic heel wounds (not a candidate for vascular intervention as he would need an aortobifemoral bypass to treat his aortoiliac occlusion). Vascular recommended transition to hospice.      - Patient was sent by his facility, as he was thought to be dehydrated and they could not get an IV access on him to initiate IV fluids, patient with poor appetite, poor oral intake over the last few days, where they thought to initiate IV fluids  on him but could not get an IV access so he was sent to ED for further evaluation, patient reports poor appetite, he is on mirtazapine , reviewing records his nephrostomy exchange by IR 09/08/2024, but it does appear suprapubic catheter has not been changed in several months and this was changed by EDP.  BSE requested. Pt is known to SLP service from previous dysphagia history and known zenker's diverticulum. Type of Study: Bedside Swallow Evaluation Previous Swallow Assessment: 06/2024 Diet Prior to this Study: Dysphagia 1 (pureed);Moderately thick liquids (Level 3, honey thick) Temperature Spikes Noted: No Respiratory Status: Nasal cannula History of Recent Intubation: No Behavior/Cognition: Alert;Cooperative;Pleasant mood Oral Cavity Assessment: Within Functional Limits Oral Care Completed by SLP: Yes Oral Cavity - Dentition: Poor condition;Missing dentition Vision: Functional for self-feeding Self-Feeding Abilities: Needs assist Patient Positioning: Upright in bed Baseline Vocal Quality: Normal Volitional Cough: Strong Volitional Swallow: Able to elicit    Oral/Motor/Sensory Function Overall Oral Motor/Sensory Function: Within functional limits   Ice Chips Ice chips: Within functional limits Presentation: Spoon   Thin Liquid Thin Liquid: Impaired Presentation:  Straw;Spoon Pharyngeal  Phase Impairments: Suspected delayed Swallow;Cough - Immediate    Nectar Thick Nectar Thick Liquid: Within functional limits Presentation: Cup;Spoon;Straw   Honey Thick Honey Thick Liquid: Within functional limits Other Comments: Too thick!   Puree Puree: Within functional limits Presentation: Spoon   Solid     Solid: Impaired Oral Phase Impairments: Impaired mastication (loose teeth) Oral Phase Functional Implications: Impaired mastication     Thank you,  Lamar Candy, CCC-SLP [phone]  Clea Dubach 09/18/2024,11:36 AM

## 2024-09-18 NOTE — Plan of Care (Signed)
   Problem: Education: Goal: Knowledge of General Education information will improve Description: Including pain rating scale, medication(s)/side effects and non-pharmacologic comfort measures Outcome: Progressing   Problem: Clinical Measurements: Goal: Ability to maintain clinical measurements within normal limits will improve Outcome: Progressing Goal: Diagnostic test results will improve Outcome: Progressing

## 2024-09-19 DIAGNOSIS — Z515 Encounter for palliative care: Secondary | ICD-10-CM

## 2024-09-19 DIAGNOSIS — I5032 Chronic diastolic (congestive) heart failure: Secondary | ICD-10-CM

## 2024-09-19 DIAGNOSIS — E872 Acidosis, unspecified: Secondary | ICD-10-CM | POA: Diagnosis not present

## 2024-09-19 DIAGNOSIS — L8990 Pressure ulcer of unspecified site, unspecified stage: Secondary | ICD-10-CM

## 2024-09-19 DIAGNOSIS — I679 Cerebrovascular disease, unspecified: Secondary | ICD-10-CM

## 2024-09-19 DIAGNOSIS — N184 Chronic kidney disease, stage 4 (severe): Secondary | ICD-10-CM

## 2024-09-19 DIAGNOSIS — I130 Hypertensive heart and chronic kidney disease with heart failure and stage 1 through stage 4 chronic kidney disease, or unspecified chronic kidney disease: Secondary | ICD-10-CM

## 2024-09-19 LAB — BASIC METABOLIC PANEL WITH GFR
Anion gap: 7 (ref 5–15)
BUN: 32 mg/dL — ABNORMAL HIGH (ref 8–23)
CO2: 22 mmol/L (ref 22–32)
Calcium: 8 mg/dL — ABNORMAL LOW (ref 8.9–10.3)
Chloride: 109 mmol/L (ref 98–111)
Creatinine, Ser: 2.31 mg/dL — ABNORMAL HIGH (ref 0.61–1.24)
GFR, Estimated: 28 mL/min — ABNORMAL LOW
Glucose, Bld: 88 mg/dL (ref 70–99)
Potassium: 4.8 mmol/L (ref 3.5–5.1)
Sodium: 139 mmol/L (ref 135–145)

## 2024-09-19 LAB — URINE CULTURE

## 2024-09-19 LAB — CBC
HCT: 27.9 % — ABNORMAL LOW (ref 39.0–52.0)
Hemoglobin: 8.2 g/dL — ABNORMAL LOW (ref 13.0–17.0)
MCH: 28.5 pg (ref 26.0–34.0)
MCHC: 29.4 g/dL — ABNORMAL LOW (ref 30.0–36.0)
MCV: 96.9 fL (ref 80.0–100.0)
Platelets: 236 K/uL (ref 150–400)
RBC: 2.88 MIL/uL — ABNORMAL LOW (ref 4.22–5.81)
RDW: 18.7 % — ABNORMAL HIGH (ref 11.5–15.5)
WBC: 8 K/uL (ref 4.0–10.5)
nRBC: 0 % (ref 0.0–0.2)

## 2024-09-19 LAB — MAGNESIUM: Magnesium: 2 mg/dL (ref 1.7–2.4)

## 2024-09-19 MED ORDER — LACTATED RINGERS IV SOLN: INTRAVENOUS | Status: DC

## 2024-09-19 MED ORDER — JUVEN PO PACK: 1.0000 | PACK | Freq: Two times a day (BID) | ORAL | Status: DC

## 2024-09-19 NOTE — Progress Notes (Signed)
 " PROGRESS NOTE    Jordan Ward  FMW:[id number] DOB: January 23, 1941 DOA: 09/17/2024 PCP: Shona Norleen PEDLAR, MD   Brief Narrative:     Jordan Ward  is a 83 y.o. male, with medical history significant of pulmonary fibrosis/COPD, CAD, prior stroke, multiple sclerosis with neurogenic bladder, has had a longstanding suprapubic tube with atrophic right kidney and a left percutaneous nephrostomy tube, hypothyroidism, pressure ulcers, pneumonia, long-term resident at [facility].  Patient with PAD, chronic heel wounds (not a candidate for vascular intervention as he would need an aortobifemoral bypass to treat his aortoiliac occlusion). Vascular recommended transition to hospice.    - Patient was sent by his facility, as he was thought to be dehydrated and they could not get an IV access on him to initiate IV fluids, patient with poor appetite, poor oral intake over the last few days, where they thought to initiate IV fluids on him but could not get an IV access so he was sent to ED for further evaluation, patient reports poor appetite, he is on mirtazapine , reviewing records his nephrostomy exchange by IR 09/08/2024, but it does appear suprapubic catheter has not been changed in several months and this was changed by EDP.  He is awaiting palliative evaluation for further goals of care discussion as he appears to be failing to thrive overall.  Assessment & Plan:   Principal Problem:   Lactic acidosis Active Problems:   Cerebrovascular disease   Chronic diastolic (congestive) heart failure (HCC)   Essential hypertension   CKD (chronic kidney disease) stage 4, GFR 15-29 ml/min (HCC)   Nephrostomy present (HCC)   Pressure ulcer  Assessment and Plan:   Lactic acidosis-resolved Dehydration Pneumonia Possible UTI -Volume depletion versus infectious process -Continue with IV fluids, trending down with hydration - Possible infectious due to UTI versus pneumonia. - Chest x-ray significant for left lung  opacity, will start on Rocephin  and azithromycin  - History of dysphagia, reports he is on dysphagia 1 with thick liquid, will consult SLP. - Possible urine infection, suprapubic Foley catheter exchanged in ED, UA is pending, but meanwhile continue with Rocephin . - Received broad-spectrum antibiotics - Follow-up blood cultures with no growth noted and urine cultures with growth of multiple species - Continue with IV fluids -Azithromycin  and Rocephin  day 3/5     AKI on stage IIIb CKD:  - Baseline creatinine 1.9, currently 2.31, continue with IV fluids     Chronic combined HFrEF  HTN -2D echo January 2025 LVEF was 30 - 35%. - Normotensive, no significant evidence of volume overload    Neurogenic bladder, obstructive uropathy, chronic percutaneous nephrostomy tube:  - On UA . - Prepubic catheter exchanged in ED . - Nephrostomy tube exchanged by IR 09/08/2024      COPD, pulmonary fibrosis, chronic hypoxic respiratory failure:  - On oxygen  as needed - No wheezing or respiratory distress - Continue scheduled DuoNebs and as needed albuterol    GERD - Continue with PPI   Hypothyroidism:   - Continue levothyroxine    Deconditioning/multiple sclerosis Failure to thrive Protein calorie malnutrition - Continue with supplement. - Will consult nutritionist   Multiple pressure ulcers, chronic, POA Sacral, right and left buttocks pressure ulcers , left heel ulcer. - Chronic, do not appear to be infected, will consult wound care    PAD -not a candidate for any surgical intervention, vascular surgery recommended hospice.  Appreciate palliative evaluation. - Continue with aspirin  -Continue pain management with hydrocodone  as well as Dilaudid     DVT  prophylaxis:Heparin  Code Status: Full Family Communication: None at bedside Disposition Plan:  Status is: Inpatient Remains inpatient appropriate because: Need for IV medications.   Consultants:  Palliative care  Procedures:   None  Antimicrobials:  Anti-infectives (From admission, onward)    Start     Dose/Rate Route Frequency Ordered Stop   09/18/24 0800  azithromycin  (ZITHROMAX ) 500 mg in sodium chloride  0.9 % 250 mL IVPB        500 mg 250 mL/hr over 60 Minutes Intravenous Every 24 hours 09/17/24 1629     09/17/24 2200  cefTRIAXone  (ROCEPHIN ) 2 g in sodium chloride  0.9 % 100 mL IVPB        2 g 200 mL/hr over 30 Minutes Intravenous Every 24 hours 09/17/24 1629     09/17/24 1530  ceFEPIme  (MAXIPIME ) 2 g in sodium chloride  0.9 % 100 mL IVPB        2 g 200 mL/hr over 30 Minutes Intravenous  Once 09/17/24 1525 09/17/24 1647   09/17/24 1530  metroNIDAZOLE  (FLAGYL ) IVPB 500 mg        500 mg 100 mL/hr over 60 Minutes Intravenous  Once 09/17/24 1525 09/17/24 1721   09/17/24 1530  vancomycin  (VANCOCIN ) IVPB 1000 mg/200 mL premix        1,000 mg 200 mL/hr over 60 Minutes Intravenous  Once 09/17/24 1525 09/17/24 1726       Subjective: Patient seen and evaluated today with ongoing complaints of pain in both feet.  No acute overnight events noted.  Objective: Vitals:   09/18/24 0609 09/18/24 1300 09/18/24 2008 09/19/24 0442  BP: 112/73 (!) 145/87 (!) 117/94 100/65  Pulse: 66 80 80 70  Resp: 16 18 20 18   Temp: 98.2 F (36.8 C) 98.4 F (36.9 C) (!) 97.5 F (36.4 C) 98.5 F (36.9 C)  TempSrc: Oral Oral Oral Oral  SpO2: 100% 96% 99% 98%  Weight:      Height:        Intake/Output Summary (Last 24 hours) at 09/19/2024 1004 Last data filed at 09/19/2024 0845 Gross per 24 hour  Intake 2490.23 ml  Output 400 ml  Net 2090.23 ml   Filed Weights   09/17/24 1329  Weight: 45.4 kg    Examination:  General exam: Appears calm and comfortable  Respiratory system: Clear to auscultation. Respiratory effort normal. Cardiovascular system: S1 & S2 heard, RRR.  Gastrointestinal system: Abdomen is soft Central nervous system: Alert and awake Extremities: Bilateral lower extremity wounds noted. Skin: No  significant lesions noted Psychiatry: Flat affect.    Data Reviewed: I have personally reviewed following labs and imaging studies  CBC: Recent Labs  Lab 09/17/24 1427 09/18/24 0503 09/19/24 0507  WBC 9.7 8.1 8.0  NEUTROABS 6.2  --   --   HGB 9.6* 9.0* 8.2*  HCT 32.5* 31.0* 27.9*  MCV 95.0 96.9 96.9  PLT 357 285 236   Basic Metabolic Panel: Recent Labs  Lab 09/17/24 1427 09/18/24 0503 09/19/24 0507  NA 138 141 139  K 5.1 4.9 4.8  CL 104 110 109  CO2 20* 24 22  GLUCOSE 118* 102* 88  BUN 29* 28* 32*  CREATININE 2.22* 2.18* 2.31*  CALCIUM  8.5* 7.9* 8.0*  MG  --   --  2.0   GFR: Estimated Creatinine Clearance: 16.1 mL/min (A) (by C-G formula based on SCr of 2.31 mg/dL (H)). Liver Function Tests: Recent Labs  Lab 09/17/24 1427  AST 16  ALT <5  ALKPHOS 137*  BILITOT 0.3  PROT 7.2  ALBUMIN 3.0*   No results for input(s): LIPASE, AMYLASE in the last 168 hours. No results for input(s): AMMONIA in the last 168 hours. Coagulation Profile: Recent Labs  Lab 09/17/24 1427  INR 1.0   Cardiac Enzymes: No results for input(s): CKTOTAL, CKMB, CKMBINDEX, TROPONINI in the last 168 hours. BNP (last 3 results) Recent Labs    07/06/24 1445  PROBNP 21,393.0*   HbA1C: No results for input(s): HGBA1C in the last 72 hours. CBG: No results for input(s): GLUCAP in the last 168 hours. Lipid Profile: No results for input(s): CHOL, HDL, LDLCALC, TRIG, CHOLHDL, LDLDIRECT in the last 72 hours. Thyroid  Function Tests: No results for input(s): TSH, T4TOTAL, FREET4, T3FREE, THYROIDAB in the last 72 hours. Anemia Panel: No results for input(s): VITAMINB12, FOLATE, FERRITIN, TIBC, IRON, RETICCTPCT in the last 72 hours. Sepsis Labs: Recent Labs  Lab 09/17/24 1427 09/17/24 1535 09/18/24 0743  LATICACIDVEN 3.8* 2.0* 1.7    Recent Results (from the past 240 hours)  Blood Culture (routine x 2)     Status: None (Preliminary  result)   Collection Time: 09/17/24  2:27 PM   Specimen: BLOOD LEFT FOREARM  Result Value Ref Range Status   Specimen Description   Final    BLOOD LEFT FOREARM BOTTLES DRAWN AEROBIC AND ANAEROBIC   Special Requests Blood Culture adequate volume  Final   Culture   Final    NO GROWTH 2 DAYS Performed at [facility], [address], [place], [place] [address]    Report Status PENDING  Incomplete  Blood Culture (routine x 2)     Status: None (Preliminary result)   Collection Time: 09/17/24  2:27 PM   Specimen: BLOOD RIGHT ARM  Result Value Ref Range Status   Specimen Description   Final    BLOOD RIGHT ARM BOTTLES DRAWN AEROBIC AND ANAEROBIC   Special Requests Blood Culture adequate volume  Final   Culture   Final    NO GROWTH 2 DAYS Performed at [facility], [address], [place], [place] [address]    Report Status PENDING  Incomplete  Urine Culture     Status: Abnormal   Collection Time: 09/18/24  5:13 AM   Specimen: Urine, Random  Result Value Ref Range Status   Specimen Description   Final    URINE, RANDOM Performed at [facility], [address], [place], [place] [address]    Special Requests   Final    NONE Reflexed from [id number] Performed at [facility], [address] [place], [place] [address]    Culture MULTIPLE SPECIES PRESENT, SUGGEST RECOLLECTION (A)  Final   Report Status 09/19/2024 FINAL  Final         Radiology Studies: DG Chest Port 1 View Result Date: 09/17/2024 CLINICAL DATA:  Provided history: Questionable sepsis - evaluate for abnormality EXAM: PORTABLE CHEST 1 VIEW COMPARISON:  Radiograph 07/06/2024, CT 07/10/2020 FINDINGS: Interstitial coarsening with bronchial thickening, chronic but increased in degree from prior exam. Emphysema with question of basilar fibrosis. Ill-defined patchy opacity at the left lung base. The heart is prominent size but stable. Blunting of the costophrenic angles, possible small effusions. No pneumothorax. Chronic right  shoulder arthropathy. IMPRESSION: 1. Ill-defined patchy opacity at the left lung base, atelectasis versus pneumonia. 2. Chronic interstitial coarsening with bronchial thickening, increased in degree from prior exam. This may be infectious/inflammatory. 3. Emphysema with question of basilar fibrosis. 4. Possible small pleural effusions. Electronically Signed   By: Andrea Marlee HERO.D.  On: 09/17/2024 14:56        Scheduled Meds:  (feeding supplement) PROSource Plus  30 mL Oral BID   ascorbic acid   500 mg Oral q morning   aspirin   81 mg Oral Q breakfast   busPIRone   5 mg Oral TID   diazepam   2 mg Oral BID   heparin   5,000 Units Subcutaneous Q8H   levothyroxine   75 mcg Oral Q0600   melatonin  3 mg Oral QHS   mirtazapine   15 mg Oral QHS   multivitamin with minerals  1 tablet Oral q morning   oxybutynin   5 mg Oral BID   pantoprazole   40 mg Oral Daily   polyethylene glycol  17 g Oral q morning   senna-docusate  2 tablet Oral QHS   thiamine   100 mg Oral Daily   Continuous Infusions:  azithromycin  500 mg (09/19/24 9177)   cefTRIAXone  (ROCEPHIN )  IV 2 g (09/18/24 2156)     LOS: 2 days    Time spent: 55 minutes    Adalyne Lovick Jordan Fairly, DO [facility]  If 7PM-7AM, please contact night-coverage [email or web address] 09/19/2024, 10:04 AM   "

## 2024-09-19 NOTE — Consult Note (Signed)
 "                                                  Palliative Care Consult Note                                  Date: 09/19/2024   Patient Name: Jordan Ward  DOB: 12-Mar-1941  MRN: [id number]  Age / Sex: 83 y.o., male  PCP: Shona Norleen PEDLAR, MD Referring Physician: Maree Adron BIRCH, DO  Reason for Consultation: Establishing goals of care  Past Medical History:  Diagnosis Date   Anemia    Arthritis    Back pain, chronic    Bilateral carotid bruits    C. difficile colitis 09/2011   CAD (coronary artery disease)    Carotid artery stenosis    Cerebrovascular disease    Cerebrovascular disease 08/17/2018   Colon polyps    Complication of cystostomy catheter, initial encounter 04/20/2020   Dyslipidemia    Dysphagia 10/07/2011   Encephalopathy    Gait disorder    HA (headache)    High grade dysplasia in colonic adenoma 09/2005   History of kidney stones    HTN (hypertension), malignant 10/06/2011   Hypernatremia    Hypokalemia    Hypothyroidism 10/08/2011   Insomnia    Junctional rhythm    Kidney stones    MS (multiple sclerosis)    Nausea and vomiting 03/02/2020   Neuromuscular disorder (HCC)    MS   OSA (obstructive sleep apnea)    Paroxysmal atrial tachycardia    Peripheral vascular disease    Pneumonia 4 yrs ago   Pulmonary fibrosis (HCC) 10/06/2011   Pulmonary nodule 10/08/2011   PVD (peripheral vascular disease)    Sacral ulcer (HCC)    Sleep apnea    cannot tolerate   Stroke (HCC)    left sided weakness   Suprapubic catheter (HCC)    TIA (transient ischemic attack)    Tremors of nervous system 10/08/2011   Urinary tract infection    Ventral hernia without obstruction or gangrene     Large 8X9cm ventral hernia with loss of domain. CT reads report as diastasis recti with herniation or diastasis recti.  Dr. Kallie, Surgery, reviewed CT with radiology and there is herniation with only hernia sac or peritoneum over the bowel and large separation of the rectus muslce  (i.e. diastasis recti aka loss of domain).  No surgical intervention recommended given size, age, and health.     Subjective:   This NP Camellia Kays reviewed medical records, received report from team, assessed the patient and then meet at the patient's bedside to discuss diagnosis, prognosis, GOC, EOL wishes disposition and options.  Before meeting with the patient/family, I spent time reviewing the chart notes including admission H&P, ED provider note both from 09/17/2024; nursing notes from yesterday, hospice note from yesterday, TOC note from yesterday, SLP note from yesterday, nursing note from today.  Later today I reviewed hospice note from today and dietitian note from today. I also reviewed vital signs, nursing flowsheets, medication administrations record, labs, and imaging. Labs reviewed include CBC which shows normal white blood cell count at 8.0 in the setting of pneumonia and possible UTI, BMP shows bump in creatinine to 2.31 today from 2.18 yesterday  in the setting of AKI on CKD 3 (baseline creatinine 1.9).  I met with the patient at the bedside, no family is present.   We meet to discuss diagnosis prognosis, GOC, EOL wishes, disposition and options. Concept of Palliative Care was introduced as specialized medical care for people and their families living with serious illness.  If focuses on providing relief from the symptoms and stress of a serious illness.  The goal is to improve quality of life for both the patient and the family. Values and goals of care important to patient and family were attempted to be elicited.  Created space and opportunity for patient  and family to explore thoughts and feelings regarding current medical situation   Natural trajectory and current clinical status were discussed. Questions and concerns addressed. Patient  encouraged to call with questions or concerns.    Patient/Family Understanding of Illness: The patient states that he is here for an  infection but they are not sure where.  He feels pretty good today, is having some discomfort/pain in his sacral wound.  He is aware of the multiple wounds he has.  We spent time talking about his chronic illnesses, acute presentation including apparent pneumonia, possible UTI, dehydration with AKI.  Life Review: The patient came from [facility], seems to think he is there for short-term/rehab.  Previously lived with his wife.  He has 2 daughters, 1 of whom lives with he and his wife, and another one who is married and lives on her own.  He enjoys painting and playing games.  Previously worked for [employer].  Today's Discussion: In addition to discussions described above we had extensive discussion on various topics.  We spent time talking through the details of his clinical history and noted the multiple illnesses that he has and is struggling with.  Specifically, we spent dedicated time talking about his PAD and vascular recommendation to consider hospice.  We spent time exploring this recommendation and how he would does not prepared for it.  He is clear that he does not want to consider hospice care at this time.  We spent time reviewing previous palliative notes from his admission in May, although he does not remember it.  We discussed that previous notes indicated he wanted to be a full code but would not want prolonged life support.  We spent time talking about CODE STATUS and statistical outcomes in patients with similar clinical picture.  At this time he would like to remain a full code but would not want prolonged artificial life support.  I encouraged him to discuss this with his family as if he ends up on life support, it would need to be their direction to determine when is too long.  He verbalized understanding.  We spent time talking about appetite and how it is not very good at [facility], although it seems he was on a dysphagia 1 diet there.  He states his appetite  is better here on dysphagia 3 and feels like it will be better if this diet continues when he discharges.  He states that he usually drinks a lot, which we talked about being in conflict with apparent dehydration.  We talked about, if he is wanting to continue to try to get better, the importance of nutrition to allow wound healing.  He notes that they have been doing things here for him that they were not doing at [facility].  He does not think that his wounds are being  well cared for at [facility] and wonders if that is contributing.  We spent time reviewing his current wishes including full code and full scope of care, which she confirms.  I shared a palliative medicine will continue to follow along and we check back in a couple days to allow time for the medical team to continue treating his acute illness.  He shares that he would prefer not to go back to [facility], although this would be up to discharge planning.  I told him I would come back and check on him in a couple days and he agrees. I provided emotional and general support through therapeutic listening, empathy, sharing of stories, and other techniques. I answered all questions and addressed all concerns to the best of my ability.  Goals: Full code, full scope of care.  Would not want prolonged life support (encouraged him to discuss with family what this needs).  Review of Systems  Respiratory:  Positive for cough. Negative for shortness of breath.   Cardiovascular:  Negative for chest pain.  Gastrointestinal:  Negative for abdominal pain, nausea and vomiting.  Skin:        Admits sacral pain    Objective:   Primary Diagnoses: Present on Admission:  Lactic acidosis  Cerebrovascular disease  Chronic diastolic (congestive) heart failure (HCC)  CKD (chronic kidney disease) stage 4, GFR 15-29 ml/min (HCC)  Essential hypertension  Pressure ulcer   Vital Signs:  BP 106/72 (BP Location: Right Arm)   Pulse 90    Temp 97.8 F (36.6 C) (Oral)   Resp 18   Ht 5' 5 (1.651 m)   Wt 45.4 kg   SpO2 98%   BMI 16.64 kg/m   Physical Exam Vitals and nursing note reviewed.  Constitutional:      General: He is not in acute distress.    Appearance: He is ill-appearing. He is not toxic-appearing.  HENT:     Head: Normocephalic and atraumatic.  Cardiovascular:     Rate and Rhythm: Normal rate.  Pulmonary:     Effort: Pulmonary effort is normal. No respiratory distress.  Abdominal:     General: Abdomen is flat.  Skin:    General: Skin is warm and dry.  Neurological:     General: No focal deficit present.     Mental Status: He is alert.  Psychiatric:        Mood and Affect: Mood normal.        Behavior: Behavior normal.     Palliative Assessment/Data: 40-50%   Advanced Care Planning:   Existing Vynca/ACP Documentation: Advance directive signed 09/07/2015  Primary Decision Maker: PATIENT  Pertinent diagnosis: Lactic acidosis, dehydration, pneumonia, AKI on CKD 3, pulmonary fibrosis/COPD, multiple sclerosis with neurogenic bladder, longstanding super pubic tube with atrophic right kidney and left percutaneous nephrostomy tube, multiple pressure ulcers, pneumonia, possible UTI.  The patient and/or family consented to a voluntary Advance Care Planning Conversation in person. Individuals present for the conversation: The patient Ubaldo Core; Camellia Kays, NP  Summary of the conversation: We discussed chronic health struggles, current acute presentation, prognosis, CODE STATUS, options for care including full and aggressive care through transition to comfort care/hospice (hospice previously recommended by vascular).  Outcome of the conversations and/or documents completed: Full Code, full scope of care; would not want prolonged life support.  I spent 20 minutes providing separately identifiable ACP services with the patient and/or surrogate decision maker in a voluntary, in-person conversation  discussing the patient's wishes and goals  as detailed in the above note.  Assessment & Plan:   HPI/Patient Profile: 83 y.o. male  with past medical history of pulmonary fibrosis/COPD, CAD, prior stroke, multiple sclerosis with neurogenic bladder, has had a longstanding suprapubic tube with atrophic right kidney and a left percutaneous nephrostomy tube, hypothyroidism, pressure ulcers, pneumonia, long-term resident at [facility]. Patient with PAD, chronic heel wounds (not a candidate for vascular intervention as he would need an aortobifemoral bypass to treat his aortoiliac occlusion). Vascular recommended transition to hospice. He presented from [facility]TC SNF with dehydration. He was admitted on 09/17/2024 with lactic acidosid, dehydration, pneumonia, possible UTI, AKI on CKD 3b, chronic combined HFrEF, deconditioning, multiple sclerosis, multiple pressure ulcers (chronic, POA). PAD (not a surgical candidate), and other.  Palliative medicine was consulted for GOC conversations.   SUMMARY OF RECOMMENDATIONS   Full Code Full scope of care Would not want prolonged life support Time for outcomes Palliative medicine will follow-up in 2 days (Wednesday) for ongoing conversations pending clinical progress Please notify us  if any significant clinical change or new palliative needs in the interim  Symptom Management:  Per primary team Palliative medicine is available to assist as needed  Code Status: Full Code  Prognosis:  Unable to determine  Discharge Planning:  To Be Determined   Discussed with: Patient, medical team, nursing team    Thank you for allowing us  to participate in the care of YIANNI SKILLING PMT will continue to support holistically.  Billing based on MDM: Moderate  Detailed review of medical records (labs, imaging, vital signs), medically appropriate exam, discussed with treatment team, counseling and education to patient, family, & staff, documenting clinical information,  medication management, coordination of care  Signed by: Camellia Kays, NP Palliative Medicine Team  Team Phone # [phone] (Nights/Weekends)  09/19/2024, 2:16 PM   "

## 2024-09-19 NOTE — Progress Notes (Addendum)
 Initial Nutrition Assessment  DOCUMENTATION CODES:   Not applicable  INTERVENTION:  Magic cup TID with meals, each supplement provides 290 kcal and 9 grams of protein Continue Prosource Plus 30 ml PO BID, each packet provides 100 kcal and 15 gm protein Continue MVI with minerals daily Continue vitamin C  to support healing  NUTRITION DIAGNOSIS:  Increased nutrient needs related to wound healing as evidenced by estimated needs.  GOAL:  Patient will meet greater than or equal to 90% of their needs  MONITOR:  PO intake, Supplement acceptance, Skin  REASON FOR ASSESSMENT:  Consult Wound healing  ASSESSMENT:  83 yo male admitted with lactic acidosis, dehydration, PNA, possible UTI. PMH includes multiple sclerosis, PVD, TIA, pulmonary fibrosis, dysphagia, hypothyroidism, sacral ulcer, HTN, chronic back pain, gait disorder, HLD, CVD, CAD, suprapubic catheter, stroke, nephrostomy tube.  Patient has been eating poorly d/t decreased appetite for a few days at [facility].  He has lost a significant amount of weight in the past few months.   S/P bedside swallow evaluation with SLP on 12/21. Patient has been on nectar thick liquids and soft solids at SNF. Plans to continue dysphagia 3 diet with nectar thick liquids while hospitalized. Average meal intake since admission is 80%. He is taking Prosource Plus BID. He received Juven during last hospitalization, but since it is a thin liquid, will not order this time. He is receiving vitamin C .   Weight history reviewed.  07/06/24: 50.5 kg 09/17/24: 45.4 kg 10% weight loss within the past 2.5 months is significant  Labs reviewed.   Medications reviewed and include vitamin C , synthroid , remeron , MVI with minerals, protonix , miralax , senokot-s, thiamine . IVF: LR at 75 ml/h  NUTRITION - FOCUSED PHYSICAL EXAM: Unable to complete; per MD note, patient with muscle depletions; from previous RD assessment in October 2025, patient with severe muscle wasting  r/t multiple sclerosis  Diet Order:   Diet Order             DIET DYS 3 Fluid consistency: Nectar Thick  Diet effective now                   EDUCATION NEEDS:  Not appropriate for education at this time  Skin:  Skin Assessment: Skin Integrity Issues: Skin Integrity Issues:: Stage II, Unstageable, Other (Comment) Stage II: sacrum Unstageable: L heel Other: wound to R ischial tuberosity  Last BM:  12/21  Height:  Ht Readings from Last 1 Encounters:  09/17/24 5' 5 (1.651 m)   Weight:  Wt Readings from Last 1 Encounters:  09/17/24 45.4 kg   BMI:  Body mass index is 16.64 kg/m.  Estimated Nutritional Needs:  Kcal:  1500-1800 Protein:  80-90 gm Fluid:  1.5 L   Suzen HUNT RD, LDN, CNSC Contact via secure chat. If unavailable, use group chat RD Inpatient.

## 2024-09-19 NOTE — Plan of Care (Signed)
  Problem: Education: Goal: Knowledge of General Education information will improve Description: Including pain rating scale, medication(s)/side effects and non-pharmacologic comfort measures Outcome: Progressing   Problem: Clinical Measurements: Goal: Ability to maintain clinical measurements within normal limits will improve Outcome: Progressing   Problem: Health Behavior/Discharge Planning: Goal: Ability to manage health-related needs will improve Outcome: Not Progressing

## 2024-09-20 ENCOUNTER — Emergency Department (HOSPITAL_COMMUNITY)

## 2024-09-20 ENCOUNTER — Encounter (HOSPITAL_COMMUNITY): Payer: Self-pay | Admitting: *Deleted

## 2024-09-20 ENCOUNTER — Emergency Department (HOSPITAL_COMMUNITY)
Admission: EM | Admit: 2024-09-20 | Discharge: 2024-09-20 | Disposition: A | Discharge status: Skilled Nursing Facility | Source: Skilled Nursing Facility | Attending: Emergency Medicine | Admitting: Emergency Medicine

## 2024-09-20 ENCOUNTER — Encounter (HOSPITAL_COMMUNITY): Payer: Self-pay | Admitting: Internal Medicine

## 2024-09-20 ENCOUNTER — Other Ambulatory Visit: Payer: Self-pay

## 2024-09-20 DIAGNOSIS — I2510 Atherosclerotic heart disease of native coronary artery without angina pectoris: Secondary | ICD-10-CM | POA: Insufficient documentation

## 2024-09-20 DIAGNOSIS — J449 Chronic obstructive pulmonary disease, unspecified: Secondary | ICD-10-CM | POA: Insufficient documentation

## 2024-09-20 DIAGNOSIS — Z7951 Long term (current) use of inhaled steroids: Secondary | ICD-10-CM | POA: Diagnosis not present

## 2024-09-20 DIAGNOSIS — R0902 Hypoxemia: Secondary | ICD-10-CM | POA: Diagnosis present

## 2024-09-20 DIAGNOSIS — R7981 Abnormal blood-gas level: Secondary | ICD-10-CM

## 2024-09-20 DIAGNOSIS — Z8673 Personal history of transient ischemic attack (TIA), and cerebral infarction without residual deficits: Secondary | ICD-10-CM | POA: Insufficient documentation

## 2024-09-20 DIAGNOSIS — G35D Multiple sclerosis, unspecified: Secondary | ICD-10-CM | POA: Diagnosis not present

## 2024-09-20 DIAGNOSIS — E039 Hypothyroidism, unspecified: Secondary | ICD-10-CM | POA: Insufficient documentation

## 2024-09-20 DIAGNOSIS — Z79899 Other long term (current) drug therapy: Secondary | ICD-10-CM | POA: Insufficient documentation

## 2024-09-20 DIAGNOSIS — E872 Acidosis, unspecified: Secondary | ICD-10-CM | POA: Diagnosis not present

## 2024-09-20 DIAGNOSIS — Z7982 Long term (current) use of aspirin: Secondary | ICD-10-CM | POA: Diagnosis not present

## 2024-09-20 LAB — BASIC METABOLIC PANEL WITH GFR
Anion gap: 12 (ref 5–15)
BUN: 36 mg/dL — ABNORMAL HIGH (ref 8–23)
CO2: 17 mmol/L — ABNORMAL LOW (ref 22–32)
Calcium: 8.1 mg/dL — ABNORMAL LOW (ref 8.9–10.3)
Chloride: 109 mmol/L (ref 98–111)
Creatinine, Ser: 2.23 mg/dL — ABNORMAL HIGH (ref 0.61–1.24)
GFR, Estimated: 29 mL/min — ABNORMAL LOW
Glucose, Bld: 94 mg/dL (ref 70–99)
Potassium: 5.1 mmol/L (ref 3.5–5.1)
Sodium: 139 mmol/L (ref 135–145)

## 2024-09-20 LAB — CBC
HCT: 29 % — ABNORMAL LOW (ref 39.0–52.0)
Hemoglobin: 8.5 g/dL — ABNORMAL LOW (ref 13.0–17.0)
MCH: 28.2 pg (ref 26.0–34.0)
MCHC: 29.3 g/dL — ABNORMAL LOW (ref 30.0–36.0)
MCV: 96.3 fL (ref 80.0–100.0)
Platelets: 230 K/uL (ref 150–400)
RBC: 3.01 MIL/uL — ABNORMAL LOW (ref 4.22–5.81)
RDW: 19 % — ABNORMAL HIGH (ref 11.5–15.5)
WBC: 9 K/uL (ref 4.0–10.5)
nRBC: 0 % (ref 0.0–0.2)

## 2024-09-20 LAB — MAGNESIUM: Magnesium: 2.1 mg/dL (ref 1.7–2.4)

## 2024-09-20 MED ORDER — CEFUROXIME AXETIL 250 MG PO TABS
500.0000 mg | ORAL_TABLET | Freq: Once | ORAL | Status: AC
  Administered 2024-09-20: 500 mg via ORAL
  Filled 2024-09-20: qty 2

## 2024-09-20 MED ORDER — CEFDINIR 300 MG PO CAPS: 300.0000 mg | ORAL_CAPSULE | Freq: Two times a day (BID) | ORAL | 0 refills | Status: AC

## 2024-09-20 MED ORDER — AZITHROMYCIN 500 MG PO TABS: 500.0000 mg | ORAL_TABLET | Freq: Every day | ORAL | 0 refills | Status: AC

## 2024-09-20 MED ORDER — HYDROCODONE-ACETAMINOPHEN 5-325 MG PO TABS
1.0000 | ORAL_TABLET | Freq: Once | ORAL | Status: AC
  Administered 2024-09-20: 1 via ORAL
  Filled 2024-09-20: qty 1

## 2024-09-20 MED ORDER — DIAZEPAM 2 MG PO TABS: 2.0000 mg | ORAL_TABLET | Freq: Two times a day (BID) | ORAL | 0 refills | Status: DC

## 2024-09-20 MED ORDER — BUSPIRONE HCL 5 MG PO TABS: 5.0000 mg | ORAL_TABLET | Freq: Three times a day (TID) | ORAL | 0 refills | Status: DC

## 2024-09-20 MED ORDER — HYDROCODONE-ACETAMINOPHEN 5-325 MG PO TABS: 1.0000 | ORAL_TABLET | Freq: Three times a day (TID) | ORAL | 0 refills | Status: DC | PRN

## 2024-09-20 MED ORDER — METHOCARBAMOL 500 MG PO TABS: 500.0000 mg | ORAL_TABLET | Freq: Four times a day (QID) | ORAL | 0 refills | Status: DC | PRN

## 2024-09-20 NOTE — ED Provider Notes (Signed)
 " [place] EMERGENCY DEPARTMENT AT [facility] Provider Note   CSN: [id number] Arrival date & time: 09/20/24  1326     Patient presents with: low oxygen    Jordan Ward is a 83 y.o. male who has a past medical history ofpulmonary fibrosis/COPD, CAD, prior stroke, multiple sclerosis with neurogenic bladder, has had a longstanding suprapubic tube with atrophic right kidney and a left percutaneous nephrostomy tube, hypothyroidism, pressure ulcers, pneumonia, long-term resident at [facility]. Patient with PAD, chronic heel wounds (not a candidate for vascular intervention as he would need an aortobifemoral bypass to treat his aortoiliac occlusion).  Patient was discharged from the hospital this morning due to admission for pneumonia.  He was treated and discharged on his normal 4 L of oxygen .  During transport EMS had a oxygen  monitor on his finger that was reading in the 80s.  When he got back to the facility the EMS transported to grab one of the nurses, Sierra stating that his oxygen  was low.  They got him into a bed sat him up put him on 5 L of oxygen  and continue to monitor pulse ox which was on his finger which seem to stay in the 70s.  I spoke with the nurse Sierra who said that he was a little bit more tachypneic than normal but not in any respiratory distress.  He also was complaining of a little bit of pain in his chest but also has chronic pain in all of his body.  He was sent back to the emergency department because he could not get his oxygen  monitor to read higher.  Upon arrival I came into the room to see the patient who his oxygen  monitor was not working appropriately we got that switched out put him on his normal 4 L at which point with normal waveform he was 100%.   HPI     Prior to Admission medications  Medication Sig Start Date End Date Taking? Authorizing Provider  acetaminophen  (TYLENOL ) 325 MG tablet Take 650 mg by mouth every 4 (four) hours as needed for mild  pain (pain score 1-3) or fever. Do not exceed 3000 mg in 24 hours.    [provider]  Amino Acids-Protein Hydrolys (PRO-STAT PROFILE PO) Take 30 mLs by mouth 2 (two) times daily.    [provider]  ascorbic acid  (VITAMIN C ) 500 MG tablet Take 500 mg by mouth every morning.    [provider]  aspirin  81 MG chewable tablet Chew 1 tablet (81 mg total) by mouth daily with breakfast. 02/02/24   Pearlean Manus, MD  azithromycin  (ZITHROMAX ) 500 MG tablet Take 1 tablet (500 mg total) by mouth daily for 2 days. 09/20/24 09/22/24  Maree, Pratik D, DO  busPIRone  (BUSPAR ) 5 MG tablet Take 1 tablet (5 mg total) by mouth 3 (three) times daily. 09/20/24   Maree, Pratik D, DO  cefdinir  (OMNICEF ) 300 MG capsule Take 1 capsule (300 mg total) by mouth 2 (two) times daily for 2 days. 09/20/24 09/22/24  Maree, Pratik D, DO  diazepam  (VALIUM ) 2 MG tablet Take 1 tablet (2 mg total) by mouth 2 (two) times daily. 09/20/24   Maree, Pratik D, DO  HYDROcodone -acetaminophen  (NORCO/VICODIN) 5-325 MG tablet Take 1 tablet by mouth every 8 (eight) hours as needed for moderate pain (pain score 4-6) or severe pain (pain score 7-10). 09/20/24   Maree, Pratik D, DO  levothyroxine  (SYNTHROID ) 75 MCG tablet Take 75 mcg by mouth in the morning. 07/02/21  [provider]  lidocaine  (LIDODERM ) 5 % Place 1 patch onto the skin daily. Remove & Discard patch within 12 hours or as directed by MD    [provider]  lidocaine  4 % Place 1 patch onto the skin every 12 (twelve) hours. Directions per [facility]:? Apply to right shoulder for up to 12 hours then apply at bedtime for pain and remove at 10 pm Application times: 0800, 2200    [provider]  loratadine  (CLARITIN ) 10 MG tablet Take 10 mg by mouth at bedtime. 07/04/24 09/17/24  [provider]  melatonin 3 MG TABS tablet Take 3 mg by mouth at bedtime.    [provider]  methocarbamol  (ROBAXIN ) 500 MG tablet Take 1 tablet (500 mg  total) by mouth every 6 (six) hours as needed for muscle spasms. 09/20/24   Maree, Pratik D, DO  mirtazapine  (REMERON ) 15 MG tablet Take 0.5 tablets (7.5 mg total) by mouth at bedtime. Patient taking differently: Take 15 mg by mouth at bedtime. 02/02/24   Pearlean Manus, MD  Multiple Vitamin (MULTIVITAMIN) tablet Take 1 tablet by mouth every morning.    [provider]  omeprazole  (PRILOSEC  OTC) 20 MG tablet Take 20 mg by mouth daily.    [provider]  oxybutynin  (DITROPAN ) 5 MG tablet Take 5 mg by mouth 2 (two) times daily.    [provider]  OXYGEN  Inhale 1 L into the lungs as needed (shortness of breath, O2 sats <88%).    [provider]  polyethylene glycol (MIRALAX  / GLYCOLAX ) 17 g packet Take 17 g by mouth every morning.    [provider]  potassium chloride  (KLOR-CON ) 20 MEQ packet Take 20 mEq by mouth daily.    [provider]  senna-docusate (SENOKOT-S) 8.6-50 MG tablet Take 2 tablets by mouth at bedtime. 02/02/24   Pearlean Manus, MD  sodium chloride  (OCEAN) 0.65 % SOLN nasal spray Place 1 spray into both nostrils in the morning, at noon, in the evening, and at bedtime. For nasal dryness    [provider]  thiamine  (VITAMIN B-1) 100 MG tablet Take 1 tablet (100 mg total) by mouth daily. 10/12/23   Maree, Pratik D, DO    Allergies: Ciprofloxacin  and Tetracyclines & related    Review of Systems  Updated Vital Signs BP 135/83   Pulse 93   Temp 97.8 F (36.6 C) (Oral)   Resp 14   Ht 5' 5 (1.651 m)   Wt 45.4 kg   SpO2 98%   BMI 16.64 kg/m   Physical Exam Vitals and nursing note reviewed.  Constitutional:      General: He is not in acute distress.    Appearance: He is well-developed. He is ill-appearing. He is not diaphoretic.     Comments: Patient appears chronically ill  HENT:     Head: Normocephalic and atraumatic.  Eyes:     General: No scleral icterus.    Conjunctiva/sclera: Conjunctivae normal.   Cardiovascular:     Rate and Rhythm: Normal rate and regular rhythm.     Heart sounds: Normal heart sounds.  Pulmonary:     Effort: Pulmonary effort is normal. No respiratory distress.     Breath sounds: Normal breath sounds.     Comments: Mild tachypnea Abdominal:     Palpations: Abdomen is soft.     Tenderness: There is no abdominal tenderness.  Musculoskeletal:     Cervical back: Normal range of motion and neck supple.  Skin:  General: Skin is warm and dry.  Neurological:     Mental Status: He is alert.  Psychiatric:        Behavior: Behavior normal.     (all labs ordered are listed, but only abnormal results are displayed) Labs Reviewed - No data to display  EKG: None  Radiology: No results found.   Procedures   Medications Ordered in the ED - No data to display                                  Medical Decision Making Amount and/or Complexity of Data Reviewed Radiology: ordered.   Patient seen in the emergency department for oxygen  saturations reading low at the facility.  I repeated a portable chest x-ray I visually interpreted the x-ray and compared it to his previous chest x-ray on the 20th.  There is no significant change.  He does not appear to have any evidence of a pneumothorax.  Since he has been here his oxygen  saturations have been 100% on 4 L. Discussed the case with Dr. Loreli who discharged the patient as well as Sierra, the nurse who evaluated him on his initial return to [facility]. At the facility they only turned his oxygen  up to 5 L and he was not on a monitor that displays a waveform only a finger monitor.  May have been an equipment issue however here he does not appear to have any abnormalities throughout his entire visit. Patient will be discharged to return back to his facility. I have given discharge orders that they may titrate his oxygen  to his needs up to 10 L and that if he is having signs of respiratory distress he may return.  He  has had no change in his respiratory status here is not in any distress and appears appropriate for discharge at this time per previous plan this morning.     Final diagnoses:  None    ED Discharge Orders     None          Arloa Chroman, PA-C 09/20/24 1706    Elnor Jayson LABOR, DO 09/26/24 1652  "

## 2024-09-20 NOTE — Discharge Instructions (Addendum)
 Jordan Ward Was seen in the ER after return for evaluation of low oxygen  readings. His oxygen  has been 100% on 4 L throughout his entire visit. You may titrate his oxygen  anywhere between 4 to 10 L as needed for low oxygen  reading.  He is going to have increased oxygen  needs given his underlying lung disease, pulmonary fibrosis, and his diagnosis of pneumonia. If he is exhibiting signs of severe respiratory distress despite titrating his oxygen  to meet needs and please have him reevaluated. Otherwise he is being discharged with his previous medications and per my discussion with nurse Sierra via phone call his antibiotics have already been ordered at the facility. He is safe for discharge at this time.  Due to extended wait time for transport in the ER- patient was given his first dose or oral antibiotics tonight and pain meds. You may resume treatment in the morning on 09/21/2024 with his omnicef .

## 2024-09-20 NOTE — Plan of Care (Signed)

## 2024-09-20 NOTE — TOC Transition Note (Signed)
 Transition of Care [facility]) - Discharge Note   Patient Details  Name: Jordan Ward MRN: [id number] Date of Birth: Mar 18, 1941  Transition of [facility] CM/SW Contact:  Ronnald MARLA Sil, RN Phone Number: 09/20/2024, 11:09 AM   Clinical Narrative:     Patient with D/C orders to return to [facility], CM notified Admit Coord - Marval, obtained room assignment #C27-1, Nurse report to [phone].  CM contacted Spouse - Idell Caldron (Ph: [phone]) with update on aforementioned.  CM also contacted [facility] to request Ambulance transport and Primary RN on all the aforementioned.  No additional needs identified at this time, however IPCM team will continue to follow along and assist as appropriate    Final next level of care: Long Term Nursing Home Barriers to Discharge: No Barriers Identified   Patient Goals and CMS Choice Patient states their goals for this hospitalization and ongoing recovery are:: Return to prior setting LTC at [facility] CMS Medicare.gov Compare Post Acute Care list provided to:: Patient Choice offered to / list presented to : Patient  Discharge Placement   Existing PASRR number confirmed : 09/20/24          Patient chooses bed at:  [facility]) Patient to be transferred to facility by: [facility] Name of family member notified: Alfreda Idell ANN: [phone] Patient and family notified of of transfer: 09/20/24  Discharge Plan and Services Additional resources added to the After Visit Summary for   Post Acute Care Choice: Nursing Home, Resumption of Svcs/PTA Provider          DME Arranged: N/A DME Agency: NA  Social Drivers of Health (SDOH) Interventions SDOH Screenings   Food Insecurity: No Food Insecurity (09/17/2024)  Housing: Low Risk (09/17/2024)  Transportation Needs: No Transportation Needs (09/17/2024)  Utilities: Not At Risk (09/17/2024)  Depression (PHQ2-9): Low Risk (09/26/2022)  Financial Resource Strain: Low Risk  (11/03/2023)   Received from [facility]  Physical Activity: Inactive (11/03/2023)   Received from [facility]  Social Connections: Moderately Isolated (09/17/2024)  Stress: No Stress Concern Present (11/03/2023)   Received from [facility]  Tobacco Use: Medium Risk (07/06/2024)  Health Literacy: High Risk (11/03/2023)   Received from [facility]     Readmission Risk Interventions    09/18/2024    1:48 PM 07/07/2024    3:27 PM 10/12/2023   11:35 AM  Readmission Risk Prevention Plan  Transportation Screening Complete Complete Complete  Home Care Screening   Complete  Medication Review (RN CM)   Complete  Medication Review Oceanographer) Complete Complete   PCP or Specialist appointment within 3-5 days of discharge Complete Not Complete   HRI or Home Care Consult  Complete   SW Recovery Care/Counseling Consult  Complete   Palliative Care Screening Complete Not Applicable   Skilled Nursing Facility Complete Complete

## 2024-09-20 NOTE — Discharge Summary (Addendum)
 Physician Discharge Summary  JUSITN SALSGIVER FMW:[id number] DOB: Apr 18, 1941 DOA: 09/17/2024  PCP: Shona Norleen PEDLAR, MD  Admit date: 09/17/2024  Discharge date: 09/20/2024  Admitted From:SNF  Disposition:  SNF  Recommendations for Outpatient Follow-up:  Follow up with PCP in 1-2 weeks Remain on antibiotics with azithromycin  and cefdinir  as prescribed to complete course of treatment for pneumonia/UTI Continue other medications as prior  Home Health: None  Equipment/Devices: None  Discharge Condition:Stable  CODE STATUS: Full  Diet recommendation: Dys 3  Brief/Interim Summary: Jordan Ward  is a 83 y.o. male, with medical history significant of pulmonary fibrosis/COPD, CAD, prior stroke, multiple sclerosis with neurogenic bladder, has had a longstanding suprapubic tube with atrophic right kidney and a left percutaneous nephrostomy tube, hypothyroidism, pressure ulcers, pneumonia, long-term resident at [facility].  Patient with PAD, chronic heel wounds (not a candidate for vascular intervention as he would need an aortobifemoral bypass to treat his aortoiliac occlusion). Vascular recommended transition to hospice.    - Patient was sent by his facility, as he was thought to be dehydrated and they could not get an IV access on him to initiate IV fluids, patient with poor appetite, poor oral intake over the last few days, where they thought to initiate IV fluids on him but could not get an IV access so he was sent to ED for further evaluation, patient reports poor appetite, he is on mirtazapine , reviewing records his nephrostomy exchange by IR 09/08/2024, but it does appear suprapubic catheter has not been changed in several months and this was changed by EDP on admission.  He continues to have a poor appetite and overall poor oral intake and has seen palliative care and had goals of care discussion, however he still wants to consider full scope of care and remain full code.  He continues to  complain of significant amounts of pain and remains a high risk for readmission.  He was started on IV antibiotics and can continue oral antibiotics to complete course of treatment for his pneumonia and UTI.  His creatinine levels appear to fluctuate, but renal function is overall stable.  No other acute events or concerns noted.  Discharge Diagnoses:  Principal Problem:   Lactic acidosis Active Problems:   Cerebrovascular disease   Chronic diastolic (congestive) heart failure (HCC)   Essential hypertension   CKD (chronic kidney disease) stage 4, GFR 15-29 ml/min (HCC)   Nephrostomy present (HCC)   Pressure ulcer  Principal discharge diagnosis: UTI/pneumonia with associated lactic acidosis.  AKI on CKD stage IIIb due to dehydration in the setting of poor oral intake.  Deconditioning with failure to thrive.  Discharge Instructions  Discharge Instructions     Discharge wound care:   Complete by: As directed    Every shift      Comments: Cleanse all wounds with Vashe Soila [phone]), moisten gauze with Vashe and pack tunneled areas, cover sacral wound, top with ABD pad, pack ischial wounds with Vashe moist gauze (may need to use small roll gauze - 1 or 2 conform--[id number]). Top with ABD pad, secure with tape or mesh underwear. Change BID   Increase activity slowly   Complete by: As directed       Allergies as of 09/20/2024       Reactions   Ciprofloxacin  Other (See Comments)   Trouble swallowing according to wife Clemens out in the floor Pt can tolerate Levaquin    Tetracyclines & Related Anaphylaxis, Rash        Medication List  TAKE these medications    acetaminophen  325 MG tablet Commonly known as: TYLENOL  Take 650 mg by mouth every 4 (four) hours as needed for mild pain (pain score 1-3) or fever. Do not exceed 3000 mg in 24 hours. What changed: Another medication with the same name was removed. Continue taking this medication, and follow the directions you see  here.   ascorbic acid  500 MG tablet Commonly known as: VITAMIN C  Take 500 mg by mouth every morning.   aspirin  81 MG chewable tablet Chew 1 tablet (81 mg total) by mouth daily with breakfast.   azithromycin  500 MG tablet Commonly known as: Zithromax  Take 1 tablet (500 mg total) by mouth daily for 2 days.   busPIRone  5 MG tablet Commonly known as: BUSPAR  Take 1 tablet (5 mg total) by mouth 3 (three) times daily.   cefdinir  300 MG capsule Commonly known as: OMNICEF  Take 1 capsule (300 mg total) by mouth 2 (two) times daily for 2 days.   diazepam  2 MG tablet Commonly known as: VALIUM  Take 1 tablet (2 mg total) by mouth 2 (two) times daily.   HYDROcodone -acetaminophen  5-325 MG tablet Commonly known as: NORCO/VICODIN Take 1 tablet by mouth every 8 (eight) hours as needed for moderate pain (pain score 4-6) or severe pain (pain score 7-10). What changed: reasons to take this   levothyroxine  75 MCG tablet Commonly known as: SYNTHROID  Take 75 mcg by mouth in the morning.   lidocaine  4 % Place 1 patch onto the skin every 12 (twelve) hours. Directions per [facility]:? Apply to right shoulder for up to 12 hours then apply at bedtime for pain and remove at 10 pm Application times: 0800, 2200   lidocaine  5 % Commonly known as: LIDODERM  Place 1 patch onto the skin daily. Remove & Discard patch within 12 hours or as directed by MD   loratadine  10 MG tablet Commonly known as: CLARITIN  Take 10 mg by mouth at bedtime.   melatonin 3 MG Tabs tablet Take 3 mg by mouth at bedtime.   methocarbamol  500 MG tablet Commonly known as: ROBAXIN  Take 1 tablet (500 mg total) by mouth every 6 (six) hours as needed for muscle spasms.   mirtazapine  15 MG tablet Commonly known as: Remeron  Take 0.5 tablets (7.5 mg total) by mouth at bedtime. What changed: how much to take   multivitamin tablet Take 1 tablet by mouth every morning.   omeprazole  20 MG tablet Commonly known as: PRILOSEC  OTC Take 20 mg  by mouth daily.   oxybutynin  5 MG tablet Commonly known as: DITROPAN  Take 5 mg by mouth 2 (two) times daily.   OXYGEN  Inhale 1 L into the lungs as needed (shortness of breath, O2 sats <88%).   polyethylene glycol 17 g packet Commonly known as: MIRALAX  / GLYCOLAX  Take 17 g by mouth every morning.   potassium chloride  20 MEQ packet Commonly known as: KLOR-CON  Take 20 mEq by mouth daily.   PRO-STAT PROFILE PO Take 30 mLs by mouth 2 (two) times daily.   senna-docusate 8.6-50 MG tablet Commonly known as: Senokot-S Take 2 tablets by mouth at bedtime.   sodium chloride  0.65 % Soln nasal spray Commonly known as: OCEAN Place 1 spray into both nostrils in the morning, at noon, in the evening, and at bedtime. For nasal dryness   thiamine  100 MG tablet Commonly known as: Vitamin B-1 Take 1 tablet (100 mg total) by mouth daily.  Discharge Care Instructions  (From admission, onward)           Start     Ordered   09/20/24 0000  Discharge wound care:       Comments: Every shift      Comments: Cleanse all wounds with Vashe Soila [phone]), moisten gauze with Vashe and pack tunneled areas, cover sacral wound, top with ABD pad, pack ischial wounds with Vashe moist gauze (may need to use small roll gauze - 1 or 2 conform--[id number]). Top with ABD pad, secure with tape or mesh underwear. Change BID   09/20/24 1023            Follow-up Information     Shona Norleen PEDLAR, MD. Schedule an appointment as soon as possible for a visit in 1 week(s).   Specialty: Internal Medicine Contact information: [address] [place] [address] [phone]                Allergies[1]  Consultations: Palliative care   Procedures/Studies: [facility] Chest Port 1 View Result Date: 09/17/2024 CLINICAL DATA:  Provided history: Questionable sepsis - evaluate for abnormality EXAM: PORTABLE CHEST 1 VIEW COMPARISON:  Radiograph 07/06/2024, CT 07/10/2020 FINDINGS:  Interstitial coarsening with bronchial thickening, chronic but increased in degree from prior exam. Emphysema with question of basilar fibrosis. Ill-defined patchy opacity at the left lung base. The heart is prominent size but stable. Blunting of the costophrenic angles, possible small effusions. No pneumothorax. Chronic right shoulder arthropathy. IMPRESSION: 1. Ill-defined patchy opacity at the left lung base, atelectasis versus pneumonia. 2. Chronic interstitial coarsening with bronchial thickening, increased in degree from prior exam. This may be infectious/inflammatory. 3. Emphysema with question of basilar fibrosis. 4. Possible small pleural effusions. Electronically Signed   By: Andrea Gasman M.D.   On: 09/17/2024 14:56   IR NEPHROSTOMY EXCHANGE LEFT Result Date: 09/08/2024 INDICATION: Chronic left nephrostomy tube and presents for routine exchange. EXAM: LEFT NEPHROSTOMY TUBE EXCHANGE WITH FLUOROSCOPY Physician: Juliene SAUNDERS. Philip, MD COMPARISON:  None Available. MEDICATIONS: 1% lidocaine  ANESTHESIA/SEDATION: None CONTRAST:  10mL OMNIPAQUE  IOHEXOL  300 MG/ML SOLN - administered into the collecting system(s) FLUOROSCOPY: Radiation Exposure Index (as provided by the fluoroscopic device): 1 mGy Kerma COMPLICATIONS: None immediate. PROCEDURE: The procedure was explained to the patient. The risks and benefits of the procedure were discussed and the patient's questions were addressed. Informed consent was obtained from the patient. Patient was placed prone. The existing catheter and surrounding skin were prepped and draped in sterile fashion. Maximal barrier sterile technique was utilized including caps, mask, sterile gowns, sterile gloves, sterile drape, hand hygiene and skin antiseptic. Contrast was injected through the nephrostomy tube. Nephrostomy tube was cut and removed over a wire. New 10 French multipurpose drain was advanced over the wire and reconstituted in the renal pelvis. Contrast injection confirmed  placement in the renal pelvis. Skin was anesthetized with 1% lidocaine . Nephrostomy tube was sutured to skin. Nephrostomy tube was flushed with saline and attached to a gravity bag. Fluoroscopic images were taken and saved for this procedure. FINDINGS: Nephrostomy tube positioned in the left renal pelvis. IMPRESSION: Successful exchange of left nephrostomy tube with fluoroscopy. Electronically Signed   By: Juliene Philip M.D.   On: 09/08/2024 18:41     Discharge Exam: Vitals:   09/19/24 2007 09/20/24 0333  BP: (!) 101/53 107/72  Pulse:  79  Resp:  18  Temp:  97.6 F (36.4 C)  SpO2:  91%   Vitals:   09/19/24 1307 09/19/24  2004 09/19/24 2007 09/20/24 0333  BP: 106/72 (!) 80/64 (!) 101/53 107/72  Pulse: 90 64  79  Resp:  18  18  Temp: 97.8 F (36.6 C) 97.8 F (36.6 C)  97.6 F (36.4 C)  TempSrc: Oral Oral  Oral  SpO2: 98% 98%  91%  Weight:      Height:        General: Pt is alert, awake, not in acute distress Cardiovascular: RRR, S1/S2 +, no rubs, no gallops Respiratory: CTA bilaterally, no wheezing, no rhonchi Abdominal: Soft, NT, ND, bowel sounds + Extremities: no edema, no cyanosis Clear, yellow urine output    The results of significant diagnostics from this hospitalization (including imaging, microbiology, ancillary and laboratory) are listed below for reference.     Microbiology: Recent Results (from the past 240 hours)  Blood Culture (routine x 2)     Status: None (Preliminary result)   Collection Time: 09/17/24  2:27 PM   Specimen: BLOOD LEFT FOREARM  Result Value Ref Range Status   Specimen Description   Final    BLOOD LEFT FOREARM BOTTLES DRAWN AEROBIC AND ANAEROBIC   Special Requests Blood Culture adequate volume  Final   Culture   Final    NO GROWTH 3 DAYS Performed at [facility], [address], [place], [place] [address]    Report Status PENDING  Incomplete  Blood Culture (routine x 2)     Status: None (Preliminary result)   Collection Time: 09/17/24   2:27 PM   Specimen: BLOOD RIGHT ARM  Result Value Ref Range Status   Specimen Description   Final    BLOOD RIGHT ARM BOTTLES DRAWN AEROBIC AND ANAEROBIC   Special Requests Blood Culture adequate volume  Final   Culture   Final    NO GROWTH 3 DAYS Performed at [facility], [address] [place], [place] [address]    Report Status PENDING  Incomplete  Urine Culture     Status: Abnormal   Collection Time: 09/18/24  5:13 AM   Specimen: Urine, Random  Result Value Ref Range Status   Specimen Description   Final    URINE, RANDOM Performed at [facility], [address], [place], [place] [address]    Special Requests   Final    NONE Reflexed from [id number] Performed at [facility], [address], [place], [place] [address]    Culture MULTIPLE SPECIES PRESENT, SUGGEST RECOLLECTION (A)  Final   Report Status 09/19/2024 FINAL  Final     Labs: BNP (last 3 results) Recent Labs    09/29/23 1411  BNP 488.0*   Basic Metabolic Panel: Recent Labs  Lab 09/17/24 1427 09/18/24 0503 09/19/24 0507 09/20/24 0405  NA 138 141 139 139  K 5.1 4.9 4.8 5.1  CL 104 110 109 109  CO2 20* 24 22 17*  GLUCOSE 118* 102* 88 94  BUN 29* 28* 32* 36*  CREATININE 2.22* 2.18* 2.31* 2.23*  CALCIUM  8.5* 7.9* 8.0* 8.1*  MG  --   --  2.0 2.1   Liver Function Tests: Recent Labs  Lab 09/17/24 1427  AST 16  ALT <5  ALKPHOS 137*  BILITOT 0.3  PROT 7.2  ALBUMIN 3.0*   No results for input(s): LIPASE, AMYLASE in the last 168 hours. No results for input(s): AMMONIA in the last 168 hours. CBC: Recent Labs  Lab 09/17/24 1427 09/18/24 0503 09/19/24 0507 09/20/24 0405  WBC 9.7 8.1 8.0 9.0  NEUTROABS 6.2  --   --   --  HGB 9.6* 9.0* 8.2* 8.5*  HCT 32.5* 31.0* 27.9* 29.0*  MCV 95.0 96.9 96.9 96.3  PLT 357 285 236 230   Cardiac Enzymes: No results for input(s): CKTOTAL, CKMB, CKMBINDEX, TROPONINI in the last 168 hours. BNP: Invalid input(s): POCBNP CBG: No results for  input(s): GLUCAP in the last 168 hours. D-Dimer No results for input(s): DDIMER in the last 72 hours. Hgb A1c No results for input(s): HGBA1C in the last 72 hours. Lipid Profile No results for input(s): CHOL, HDL, LDLCALC, TRIG, CHOLHDL, LDLDIRECT in the last 72 hours. Thyroid  function studies No results for input(s): TSH, T4TOTAL, T3FREE, THYROIDAB in the last 72 hours.  Invalid input(s): FREET3 Anemia work up No results for input(s): VITAMINB12, FOLATE, FERRITIN, TIBC, IRON, RETICCTPCT in the last 72 hours. Urinalysis    Component Value Date/Time   COLORURINE YELLOW 09/18/2024 0513   APPEARANCEUR HAZY (A) 09/18/2024 0513   LABSPEC 1.017 09/18/2024 0513   PHURINE 8.0 09/18/2024 0513   GLUCOSEU NEGATIVE 09/18/2024 0513   HGBUR NEGATIVE 09/18/2024 0513   BILIRUBINUR NEGATIVE 09/18/2024 0513   BILIRUBINUR small (A) 10/26/2021 1538   KETONESUR NEGATIVE 09/18/2024 0513   PROTEINUR 100 (A) 09/18/2024 0513   UROBILINOGEN 0.2 10/26/2021 1538   UROBILINOGEN 0.2 07/17/2014 1215   NITRITE NEGATIVE 09/18/2024 0513   LEUKOCYTESUR LARGE (A) 09/18/2024 0513   Sepsis Labs Recent Labs  Lab 09/17/24 1427 09/18/24 0503 09/19/24 0507 09/20/24 0405  WBC 9.7 8.1 8.0 9.0   Microbiology Recent Results (from the past 240 hours)  Blood Culture (routine x 2)     Status: None (Preliminary result)   Collection Time: 09/17/24  2:27 PM   Specimen: BLOOD LEFT FOREARM  Result Value Ref Range Status   Specimen Description   Final    BLOOD LEFT FOREARM BOTTLES DRAWN AEROBIC AND ANAEROBIC   Special Requests Blood Culture adequate volume  Final   Culture   Final    NO GROWTH 3 DAYS Performed at [facility], [address] [place], [place] [address]    Report Status PENDING  Incomplete  Blood Culture (routine x 2)     Status: None (Preliminary result)   Collection Time: 09/17/24  2:27 PM   Specimen: BLOOD RIGHT ARM  Result Value Ref Range Status    Specimen Description   Final    BLOOD RIGHT ARM BOTTLES DRAWN AEROBIC AND ANAEROBIC   Special Requests Blood Culture adequate volume  Final   Culture   Final    NO GROWTH 3 DAYS Performed at [facility] [address], [place], [place] [address]    Report Status PENDING  Incomplete  Urine Culture     Status: Abnormal   Collection Time: 09/18/24  5:13 AM   Specimen: Urine, Random  Result Value Ref Range Status   Specimen Description   Final    URINE, RANDOM Performed at [facility], [address], [place], [place] [address]    Special Requests   Final    NONE Reflexed from [id number] Performed at [facility], [address] [place], [place] [address]    Culture MULTIPLE SPECIES PRESENT, SUGGEST RECOLLECTION (A)  Final   Report Status 09/19/2024 FINAL  Final     Time coordinating discharge: 35 minutes  SIGNED:   Adron JONETTA Fairly, DO [facility] 09/20/2024, 10:28 AM  If 7PM-7AM, please contact night-coverage [email or web address]     [1]  Allergies Allergen Reactions   Ciprofloxacin  Other (See Comments)    Trouble swallowing according to wife Clemens  out in the floor Pt can tolerate Levaquin    Tetracyclines & Related Anaphylaxis and Rash

## 2024-09-20 NOTE — Care Management Important Message (Signed)
 Important Message  Patient Details  Name: Jordan Ward MRN: [id number] Date of Birth: 1941/05/22   Important Message Given:  Yes - Medicare IM     Miriah Maruyama L Alyx Gee 09/20/2024, 12:01 PM

## 2024-09-20 NOTE — Progress Notes (Signed)
 Patient discharged back to [facility] via ambulance. Discharge paperwork and vital signs (most recent) placed in packet for the facility.

## 2024-09-20 NOTE — ED Notes (Signed)
 Turned pt over to left side to relieve pressure on sacral wounds with help from tech. Changed bandages on sacral wounds as they both has feces on them. Changed out draw sheet to clean one.

## 2024-09-20 NOTE — ED Triage Notes (Signed)
 Pt BIB [facility] from [facility] for c/o hypoxia; ems reports they just transported pt from [facility] and took him back to [facility], ems reports they obtained O2 sats in the mid 80's when they arrived to [place]; [facility] staff took vital signs and pt was found to have O2 sats in the 80's; pt usually wears 2L of O2   Pt's complaint is he hurts all over

## 2024-09-22 LAB — CULTURE, BLOOD (ROUTINE X 2)
Culture: NO GROWTH
Culture: NO GROWTH
Special Requests: ADEQUATE
Special Requests: ADEQUATE

## 2024-09-27 ENCOUNTER — Other Ambulatory Visit: Payer: Self-pay

## 2024-09-27 ENCOUNTER — Encounter (HOSPITAL_COMMUNITY): Payer: Self-pay

## 2024-09-27 ENCOUNTER — Telehealth: Payer: Self-pay | Admitting: Neurology

## 2024-09-27 ENCOUNTER — Inpatient Hospital Stay (HOSPITAL_COMMUNITY)
Admission: EM | Admit: 2024-09-27 | Discharge: 2024-10-30 | DRG: 640 | Disposition: E | Attending: Family Medicine | Admitting: Family Medicine

## 2024-09-27 DIAGNOSIS — E43 Unspecified severe protein-calorie malnutrition: Secondary | ICD-10-CM | POA: Diagnosis present

## 2024-09-27 DIAGNOSIS — Z556 Problems related to health literacy: Secondary | ICD-10-CM

## 2024-09-27 DIAGNOSIS — I5043 Acute on chronic combined systolic (congestive) and diastolic (congestive) heart failure: Secondary | ICD-10-CM | POA: Diagnosis not present

## 2024-09-27 DIAGNOSIS — I130 Hypertensive heart and chronic kidney disease with heart failure and stage 1 through stage 4 chronic kidney disease, or unspecified chronic kidney disease: Secondary | ICD-10-CM | POA: Diagnosis present

## 2024-09-27 DIAGNOSIS — Y846 Urinary catheterization as the cause of abnormal reaction of the patient, or of later complication, without mention of misadventure at the time of the procedure: Secondary | ICD-10-CM | POA: Diagnosis present

## 2024-09-27 DIAGNOSIS — Z881 Allergy status to other antibiotic agents status: Secondary | ICD-10-CM

## 2024-09-27 DIAGNOSIS — I2510 Atherosclerotic heart disease of native coronary artery without angina pectoris: Secondary | ICD-10-CM | POA: Diagnosis present

## 2024-09-27 DIAGNOSIS — J9621 Acute and chronic respiratory failure with hypoxia: Secondary | ICD-10-CM | POA: Diagnosis present

## 2024-09-27 DIAGNOSIS — E875 Hyperkalemia: Principal | ICD-10-CM | POA: Diagnosis present

## 2024-09-27 DIAGNOSIS — T83518A Infection and inflammatory reaction due to other urinary catheter, initial encounter: Secondary | ICD-10-CM | POA: Diagnosis present

## 2024-09-27 DIAGNOSIS — Y95 Nosocomial condition: Secondary | ICD-10-CM | POA: Diagnosis present

## 2024-09-27 DIAGNOSIS — D696 Thrombocytopenia, unspecified: Secondary | ICD-10-CM | POA: Diagnosis present

## 2024-09-27 DIAGNOSIS — J9811 Atelectasis: Secondary | ICD-10-CM | POA: Diagnosis present

## 2024-09-27 DIAGNOSIS — Z7982 Long term (current) use of aspirin: Secondary | ICD-10-CM

## 2024-09-27 DIAGNOSIS — J8410 Pulmonary fibrosis, unspecified: Secondary | ICD-10-CM | POA: Diagnosis present

## 2024-09-27 DIAGNOSIS — Z681 Body mass index (BMI) 19 or less, adult: Secondary | ICD-10-CM

## 2024-09-27 DIAGNOSIS — Z936 Other artificial openings of urinary tract status: Secondary | ICD-10-CM

## 2024-09-27 DIAGNOSIS — R627 Adult failure to thrive: Secondary | ICD-10-CM | POA: Diagnosis present

## 2024-09-27 DIAGNOSIS — R54 Age-related physical debility: Secondary | ICD-10-CM | POA: Diagnosis present

## 2024-09-27 DIAGNOSIS — Z87891 Personal history of nicotine dependence: Secondary | ICD-10-CM

## 2024-09-27 DIAGNOSIS — Z823 Family history of stroke: Secondary | ICD-10-CM

## 2024-09-27 DIAGNOSIS — N390 Urinary tract infection, site not specified: Secondary | ICD-10-CM | POA: Diagnosis present

## 2024-09-27 DIAGNOSIS — E860 Dehydration: Secondary | ICD-10-CM | POA: Diagnosis present

## 2024-09-27 DIAGNOSIS — E785 Hyperlipidemia, unspecified: Secondary | ICD-10-CM | POA: Diagnosis present

## 2024-09-27 DIAGNOSIS — N184 Chronic kidney disease, stage 4 (severe): Secondary | ICD-10-CM | POA: Diagnosis present

## 2024-09-27 DIAGNOSIS — R319 Hematuria, unspecified: Secondary | ICD-10-CM | POA: Diagnosis present

## 2024-09-27 DIAGNOSIS — Z882 Allergy status to sulfonamides status: Secondary | ICD-10-CM

## 2024-09-27 DIAGNOSIS — Z79890 Hormone replacement therapy (postmenopausal): Secondary | ICD-10-CM

## 2024-09-27 DIAGNOSIS — E872 Acidosis, unspecified: Secondary | ICD-10-CM | POA: Diagnosis present

## 2024-09-27 DIAGNOSIS — I739 Peripheral vascular disease, unspecified: Secondary | ICD-10-CM | POA: Diagnosis present

## 2024-09-27 DIAGNOSIS — E8809 Other disorders of plasma-protein metabolism, not elsewhere classified: Secondary | ICD-10-CM | POA: Diagnosis present

## 2024-09-27 DIAGNOSIS — E039 Hypothyroidism, unspecified: Secondary | ICD-10-CM | POA: Diagnosis present

## 2024-09-27 DIAGNOSIS — Z87442 Personal history of urinary calculi: Secondary | ICD-10-CM

## 2024-09-27 DIAGNOSIS — N139 Obstructive and reflux uropathy, unspecified: Secondary | ICD-10-CM | POA: Diagnosis present

## 2024-09-27 DIAGNOSIS — K7200 Acute and subacute hepatic failure without coma: Secondary | ICD-10-CM | POA: Diagnosis present

## 2024-09-27 DIAGNOSIS — L89153 Pressure ulcer of sacral region, stage 3: Secondary | ICD-10-CM | POA: Diagnosis present

## 2024-09-27 DIAGNOSIS — Z79899 Other long term (current) drug therapy: Secondary | ICD-10-CM

## 2024-09-27 DIAGNOSIS — Z66 Do not resuscitate: Secondary | ICD-10-CM | POA: Diagnosis present

## 2024-09-27 DIAGNOSIS — Z8673 Personal history of transient ischemic attack (TIA), and cerebral infarction without residual deficits: Secondary | ICD-10-CM

## 2024-09-27 DIAGNOSIS — D630 Anemia in neoplastic disease: Secondary | ICD-10-CM | POA: Diagnosis present

## 2024-09-27 DIAGNOSIS — F32A Depression, unspecified: Secondary | ICD-10-CM | POA: Diagnosis present

## 2024-09-27 DIAGNOSIS — K219 Gastro-esophageal reflux disease without esophagitis: Secondary | ICD-10-CM | POA: Diagnosis present

## 2024-09-27 DIAGNOSIS — L89620 Pressure ulcer of left heel, unstageable: Secondary | ICD-10-CM | POA: Diagnosis present

## 2024-09-27 DIAGNOSIS — Z860100 Personal history of colon polyps, unspecified: Secondary | ICD-10-CM

## 2024-09-27 DIAGNOSIS — Z8249 Family history of ischemic heart disease and other diseases of the circulatory system: Secondary | ICD-10-CM

## 2024-09-27 DIAGNOSIS — Z820 Family history of epilepsy and other diseases of the nervous system: Secondary | ICD-10-CM

## 2024-09-27 DIAGNOSIS — N319 Neuromuscular dysfunction of bladder, unspecified: Secondary | ICD-10-CM | POA: Diagnosis present

## 2024-09-27 DIAGNOSIS — J440 Chronic obstructive pulmonary disease with acute lower respiratory infection: Secondary | ICD-10-CM | POA: Diagnosis present

## 2024-09-27 DIAGNOSIS — Z515 Encounter for palliative care: Secondary | ICD-10-CM

## 2024-09-27 DIAGNOSIS — G35D Multiple sclerosis, unspecified: Secondary | ICD-10-CM | POA: Diagnosis present

## 2024-09-27 DIAGNOSIS — J189 Pneumonia, unspecified organism: Secondary | ICD-10-CM | POA: Diagnosis present

## 2024-09-27 DIAGNOSIS — N170 Acute kidney failure with tubular necrosis: Secondary | ICD-10-CM | POA: Diagnosis present

## 2024-09-27 DIAGNOSIS — R64 Cachexia: Secondary | ICD-10-CM | POA: Diagnosis present

## 2024-09-27 DIAGNOSIS — J810 Acute pulmonary edema: Secondary | ICD-10-CM | POA: Diagnosis present

## 2024-09-27 DIAGNOSIS — G4733 Obstructive sleep apnea (adult) (pediatric): Secondary | ICD-10-CM | POA: Diagnosis present

## 2024-09-27 NOTE — ED Triage Notes (Signed)
 Pt bib EMS from [facility] for elevated potassium. Unknown when lab was drawn, as facility did not call and give report, nor relayed this info to EMS. Pt alert and oriented x 4. Pt c/o nausea, had zofran  at facility PTA. Pt c/o pain to buttocks from bed sore. Pt says facility did not draw any blood today.

## 2024-09-27 NOTE — Telephone Encounter (Signed)
 Pt wife called to Cancel appt  due to  pt not feeling well

## 2024-09-28 ENCOUNTER — Emergency Department (HOSPITAL_COMMUNITY)

## 2024-09-28 ENCOUNTER — Ambulatory Visit: Admitting: Neurology

## 2024-09-28 DIAGNOSIS — R7989 Other specified abnormal findings of blood chemistry: Secondary | ICD-10-CM | POA: Diagnosis not present

## 2024-09-28 DIAGNOSIS — T83518A Infection and inflammatory reaction due to other urinary catheter, initial encounter: Secondary | ICD-10-CM | POA: Diagnosis present

## 2024-09-28 DIAGNOSIS — N184 Chronic kidney disease, stage 4 (severe): Secondary | ICD-10-CM

## 2024-09-28 DIAGNOSIS — E860 Dehydration: Secondary | ICD-10-CM | POA: Diagnosis present

## 2024-09-28 DIAGNOSIS — Z515 Encounter for palliative care: Secondary | ICD-10-CM

## 2024-09-28 DIAGNOSIS — E039 Hypothyroidism, unspecified: Secondary | ICD-10-CM | POA: Diagnosis present

## 2024-09-28 DIAGNOSIS — F32A Depression, unspecified: Secondary | ICD-10-CM | POA: Diagnosis present

## 2024-09-28 DIAGNOSIS — E43 Unspecified severe protein-calorie malnutrition: Secondary | ICD-10-CM | POA: Diagnosis present

## 2024-09-28 DIAGNOSIS — N179 Acute kidney failure, unspecified: Secondary | ICD-10-CM

## 2024-09-28 DIAGNOSIS — Y95 Nosocomial condition: Secondary | ICD-10-CM | POA: Diagnosis present

## 2024-09-28 DIAGNOSIS — R5381 Other malaise: Secondary | ICD-10-CM

## 2024-09-28 DIAGNOSIS — J810 Acute pulmonary edema: Secondary | ICD-10-CM | POA: Diagnosis present

## 2024-09-28 DIAGNOSIS — Z66 Do not resuscitate: Secondary | ICD-10-CM | POA: Diagnosis present

## 2024-09-28 DIAGNOSIS — N170 Acute kidney failure with tubular necrosis: Secondary | ICD-10-CM | POA: Diagnosis present

## 2024-09-28 DIAGNOSIS — E875 Hyperkalemia: Principal | ICD-10-CM | POA: Diagnosis present

## 2024-09-28 DIAGNOSIS — N390 Urinary tract infection, site not specified: Secondary | ICD-10-CM | POA: Diagnosis present

## 2024-09-28 DIAGNOSIS — E872 Acidosis, unspecified: Secondary | ICD-10-CM

## 2024-09-28 DIAGNOSIS — I5043 Acute on chronic combined systolic (congestive) and diastolic (congestive) heart failure: Secondary | ICD-10-CM | POA: Diagnosis not present

## 2024-09-28 DIAGNOSIS — R636 Underweight: Secondary | ICD-10-CM

## 2024-09-28 DIAGNOSIS — J189 Pneumonia, unspecified organism: Secondary | ICD-10-CM

## 2024-09-28 DIAGNOSIS — Z681 Body mass index (BMI) 19 or less, adult: Secondary | ICD-10-CM | POA: Diagnosis not present

## 2024-09-28 DIAGNOSIS — D696 Thrombocytopenia, unspecified: Secondary | ICD-10-CM | POA: Diagnosis present

## 2024-09-28 DIAGNOSIS — R627 Adult failure to thrive: Secondary | ICD-10-CM | POA: Diagnosis not present

## 2024-09-28 DIAGNOSIS — J440 Chronic obstructive pulmonary disease with acute lower respiratory infection: Secondary | ICD-10-CM | POA: Diagnosis present

## 2024-09-28 DIAGNOSIS — J9621 Acute and chronic respiratory failure with hypoxia: Secondary | ICD-10-CM | POA: Diagnosis present

## 2024-09-28 DIAGNOSIS — L89153 Pressure ulcer of sacral region, stage 3: Secondary | ICD-10-CM | POA: Diagnosis present

## 2024-09-28 DIAGNOSIS — D630 Anemia in neoplastic disease: Secondary | ICD-10-CM | POA: Diagnosis present

## 2024-09-28 DIAGNOSIS — Y846 Urinary catheterization as the cause of abnormal reaction of the patient, or of later complication, without mention of misadventure at the time of the procedure: Secondary | ICD-10-CM | POA: Diagnosis present

## 2024-09-28 DIAGNOSIS — I739 Peripheral vascular disease, unspecified: Secondary | ICD-10-CM | POA: Diagnosis present

## 2024-09-28 DIAGNOSIS — G35D Multiple sclerosis, unspecified: Secondary | ICD-10-CM | POA: Diagnosis present

## 2024-09-28 DIAGNOSIS — L89620 Pressure ulcer of left heel, unstageable: Secondary | ICD-10-CM | POA: Diagnosis present

## 2024-09-28 DIAGNOSIS — I130 Hypertensive heart and chronic kidney disease with heart failure and stage 1 through stage 4 chronic kidney disease, or unspecified chronic kidney disease: Secondary | ICD-10-CM | POA: Diagnosis present

## 2024-09-28 DIAGNOSIS — K7200 Acute and subacute hepatic failure without coma: Secondary | ICD-10-CM | POA: Diagnosis present

## 2024-09-28 LAB — URINALYSIS, ROUTINE W REFLEX MICROSCOPIC
Bilirubin Urine: NEGATIVE
Glucose, UA: NEGATIVE mg/dL
Ketones, ur: NEGATIVE mg/dL
Nitrite: NEGATIVE
Protein, ur: 100 mg/dL — AB
Specific Gravity, Urine: 1.013 (ref 1.005–1.030)
WBC, UA: >50 WBC/hpf (ref 0–5)
pH: 6 (ref 5.0–8.0)

## 2024-09-28 LAB — IMMATURE PLATELET FRACTION: Immature Platelet Fraction: 9.9 % — ABNORMAL HIGH (ref 1.2–8.6)

## 2024-09-28 LAB — COMPREHENSIVE METABOLIC PANEL WITH GFR
ALT: 589 U/L — ABNORMAL HIGH (ref 0–44)
AST: 1858 U/L — ABNORMAL HIGH (ref 15–41)
Albumin: 2.9 g/dL — ABNORMAL LOW (ref 3.5–5.0)
Alkaline Phosphatase: 248 U/L — ABNORMAL HIGH (ref 38–126)
Anion gap: 17 — ABNORMAL HIGH (ref 5–15)
BUN: 47 mg/dL — ABNORMAL HIGH (ref 8–23)
CO2: 19 mmol/L — ABNORMAL LOW (ref 22–32)
Calcium: 8.7 mg/dL — ABNORMAL LOW (ref 8.9–10.3)
Chloride: 105 mmol/L (ref 98–111)
Creatinine, Ser: 2.71 mg/dL — ABNORMAL HIGH (ref 0.61–1.24)
GFR, Estimated: 23 mL/min — ABNORMAL LOW
Glucose, Bld: 80 mg/dL (ref 70–99)
Potassium: 7.1 mmol/L (ref 3.5–5.1)
Sodium: 141 mmol/L (ref 135–145)
Total Bilirubin: 1.5 mg/dL — ABNORMAL HIGH (ref 0.0–1.2)
Total Protein: 6.8 g/dL (ref 6.5–8.1)

## 2024-09-28 LAB — CBC WITH DIFFERENTIAL/PLATELET
Abs Immature Granulocytes: 0.1 K/uL — ABNORMAL HIGH (ref 0.00–0.07)
Basophils Absolute: 0 K/uL (ref 0.0–0.1)
Basophils Relative: 0 %
Eosinophils Absolute: 0 K/uL (ref 0.0–0.5)
Eosinophils Relative: 0 %
HCT: 34.9 % — ABNORMAL LOW (ref 39.0–52.0)
Hemoglobin: 10.3 g/dL — ABNORMAL LOW (ref 13.0–17.0)
Immature Granulocytes: 1 %
Lymphocytes Relative: 6 %
Lymphs Abs: 1.2 K/uL (ref 0.7–4.0)
MCH: 28.7 pg (ref 26.0–34.0)
MCHC: 29.5 g/dL — ABNORMAL LOW (ref 30.0–36.0)
MCV: 97.2 fL (ref 80.0–100.0)
Monocytes Absolute: 1.4 K/uL — ABNORMAL HIGH (ref 0.1–1.0)
Monocytes Relative: 7 %
Neutro Abs: 17.3 K/uL — ABNORMAL HIGH (ref 1.7–7.7)
Neutrophils Relative %: 86 %
Platelets: 80 K/uL — ABNORMAL LOW (ref 150–400)
RBC: 3.59 MIL/uL — ABNORMAL LOW (ref 4.22–5.81)
RDW: 21.3 % — ABNORMAL HIGH (ref 11.5–15.5)
WBC: 20.1 K/uL — ABNORMAL HIGH (ref 4.0–10.5)
nRBC: 0.4 % — ABNORMAL HIGH (ref 0.0–0.2)

## 2024-09-28 LAB — GLUCOSE, CAPILLARY
Glucose-Capillary: 72 mg/dL (ref 70–99)
Glucose-Capillary: 159 mg/dL — ABNORMAL HIGH (ref 70–99)
Glucose-Capillary: 92 mg/dL (ref 70–99)

## 2024-09-28 LAB — HEPATITIS PANEL, ACUTE
HCV Ab: NONREACTIVE
Hep A IgM: NONREACTIVE
Hep B C IgM: NONREACTIVE
Hepatitis B Surface Ag: NONREACTIVE

## 2024-09-28 LAB — HIV ANTIBODY (ROUTINE TESTING W REFLEX): HIV Screen 4th Generation wRfx: NONREACTIVE

## 2024-09-28 LAB — BASIC METABOLIC PANEL WITH GFR
Anion gap: 15 (ref 5–15)
BUN: 52 mg/dL — ABNORMAL HIGH (ref 8–23)
CO2: 19 mmol/L — ABNORMAL LOW (ref 22–32)
Calcium: 8.2 mg/dL — ABNORMAL LOW (ref 8.9–10.3)
Chloride: 110 mmol/L (ref 98–111)
Creatinine, Ser: 2.71 mg/dL — ABNORMAL HIGH (ref 0.61–1.24)
GFR, Estimated: 23 mL/min — ABNORMAL LOW
Glucose, Bld: 79 mg/dL (ref 70–99)
Potassium: 6.4 mmol/L (ref 3.5–5.1)
Sodium: 143 mmol/L (ref 135–145)

## 2024-09-28 LAB — ACETAMINOPHEN LEVEL: Acetaminophen (Tylenol), Serum: <10 ug/mL — ABNORMAL LOW (ref 10–30)

## 2024-09-28 LAB — LACTIC ACID, PLASMA
Lactic Acid, Venous: 6.7 mmol/L (ref 0.5–1.9)
Lactic Acid, Venous: 4.6 mmol/L (ref 0.5–1.9)
Lactic Acid, Venous: 3.1 mmol/L (ref 0.5–1.9)

## 2024-09-28 LAB — POTASSIUM: Potassium: 6.7 mmol/L (ref 3.5–5.1)

## 2024-09-28 LAB — MRSA NEXT GEN BY PCR, NASAL: MRSA by PCR Next Gen: DETECTED — AB

## 2024-09-28 LAB — MAGNESIUM: Magnesium: 2.7 mg/dL — ABNORMAL HIGH (ref 1.7–2.4)

## 2024-09-28 LAB — LIPASE, BLOOD: Lipase: 12 U/L (ref 11–51)

## 2024-09-28 LAB — PHOSPHORUS: Phosphorus: 5.6 mg/dL — ABNORMAL HIGH (ref 2.5–4.6)

## 2024-09-28 MED ORDER — SODIUM CHLORIDE 0.9 % IV SOLN
2.0000 g | Freq: Once | INTRAVENOUS | Status: AC
  Administered 2024-09-28: 2 g via INTRAVENOUS
  Filled 2024-09-28: qty 12.5

## 2024-09-28 MED ORDER — CHLORHEXIDINE GLUCONATE CLOTH 2 % EX PADS
6.0000 | MEDICATED_PAD | Freq: Every day | CUTANEOUS | Status: DC
  Administered 2024-09-28 – 2024-09-29 (×2): 6 via TOPICAL

## 2024-09-28 MED ORDER — VANCOMYCIN HCL 500 MG/100ML IV SOLN: 500.0000 mg | INTRAVENOUS | Status: DC

## 2024-09-28 MED ORDER — PANTOPRAZOLE SODIUM 40 MG IV SOLR
40.0000 mg | INTRAVENOUS | Status: DC
  Administered 2024-09-28: 40 mg via INTRAVENOUS
  Filled 2024-09-28: qty 10

## 2024-09-28 MED ORDER — SODIUM CHLORIDE 0.9 % IV BOLUS
1000.0000 mL | Freq: Once | INTRAVENOUS | Status: AC
  Administered 2024-09-28: 1000 mL via INTRAVENOUS

## 2024-09-28 MED ORDER — SODIUM ZIRCONIUM CYCLOSILICATE 10 G PO PACK: 10.0000 g | PACK | Freq: Every day | ORAL | Status: DC

## 2024-09-28 MED ORDER — DEXTROSE 50 % IV SOLN
50.0000 mL | Freq: Once | INTRAVENOUS | Status: AC
  Administered 2024-09-28: 50 mL via INTRAVENOUS
  Filled 2024-09-28: qty 50

## 2024-09-28 MED ORDER — LEVOTHYROXINE SODIUM 75 MCG PO TABS: 75.0000 ug | ORAL_TABLET | Freq: Every day | ORAL | Status: DC

## 2024-09-28 MED ORDER — VANCOMYCIN HCL IN DEXTROSE 1-5 GM/200ML-% IV SOLN
1000.0000 mg | Freq: Once | INTRAVENOUS | Status: AC
  Administered 2024-09-28: 1000 mg via INTRAVENOUS
  Filled 2024-09-28: qty 200

## 2024-09-28 MED ORDER — LACTATED RINGERS IV SOLN: INTRAVENOUS | Status: DC

## 2024-09-28 MED ORDER — DEXTROSE 50 % IV SOLN
1.0000 | Freq: Once | INTRAVENOUS | Status: AC
  Administered 2024-09-28: 50 mL via INTRAVENOUS
  Filled 2024-09-28: qty 50

## 2024-09-28 MED ORDER — INSULIN ASPART 100 UNIT/ML IV SOLN
10.0000 [IU] | Freq: Once | INTRAVENOUS | Status: AC
  Administered 2024-09-28: 10 [IU] via INTRAVENOUS
  Filled 2024-09-28: qty 1

## 2024-09-28 MED ORDER — ASPIRIN 81 MG PO CHEW: 81.0000 mg | CHEWABLE_TABLET | Freq: Every day | ORAL | Status: DC

## 2024-09-28 MED ORDER — ENSURE PLUS HIGH PROTEIN PO LIQD: 237.0000 mL | Freq: Two times a day (BID) | ORAL | Status: DC

## 2024-09-28 MED ORDER — LACTATED RINGERS IV BOLUS
1000.0000 mL | Freq: Once | INTRAVENOUS | Status: AC
  Administered 2024-09-28: 1000 mL via INTRAVENOUS

## 2024-09-28 MED ORDER — MORPHINE SULFATE (PF) 4 MG/ML IV SOLN
4.0000 mg | Freq: Once | INTRAVENOUS | Status: AC
  Administered 2024-09-28: 4 mg via INTRAVENOUS
  Filled 2024-09-28: qty 1

## 2024-09-28 MED ORDER — SODIUM CHLORIDE 0.9 % IV SOLN
2.0000 g | INTRAVENOUS | Status: DC
  Administered 2024-09-29: 2 g via INTRAVENOUS
  Filled 2024-09-28: qty 12.5

## 2024-09-28 MED ORDER — PANTOPRAZOLE SODIUM 40 MG PO TBEC: 40.0000 mg | DELAYED_RELEASE_TABLET | Freq: Every day | ORAL | Status: DC

## 2024-09-28 MED ORDER — MORPHINE SULFATE (PF) 2 MG/ML IV SOLN
2.0000 mg | INTRAVENOUS | Status: DC | PRN
  Administered 2024-09-29 (×2): 2 mg via INTRAVENOUS
  Filled 2024-09-28 (×2): qty 1

## 2024-09-28 MED ORDER — INSULIN ASPART 100 UNIT/ML IV SOLN
5.0000 [IU] | Freq: Once | INTRAVENOUS | Status: AC
  Administered 2024-09-28: 5 [IU] via INTRAVENOUS
  Filled 2024-09-28: qty 1

## 2024-09-28 MED ORDER — OMEPRAZOLE MAGNESIUM 20 MG PO TBEC: 20.0000 mg | DELAYED_RELEASE_TABLET | Freq: Every day | ORAL | Status: DC

## 2024-09-28 MED ORDER — ONDANSETRON HCL 4 MG/2ML IJ SOLN
4.0000 mg | Freq: Once | INTRAMUSCULAR | Status: AC
  Administered 2024-09-28: 4 mg via INTRAVENOUS
  Filled 2024-09-28: qty 2

## 2024-09-28 MED ORDER — CALCIUM GLUCONATE-NACL 1-0.675 GM/50ML-% IV SOLN
1.0000 g | Freq: Once | INTRAVENOUS | Status: AC
  Administered 2024-09-28: 1000 mg via INTRAVENOUS
  Filled 2024-09-28: qty 50

## 2024-09-28 MED ORDER — SODIUM BICARBONATE 8.4 % IV SOLN
50.0000 meq | Freq: Once | INTRAVENOUS | Status: AC
  Administered 2024-09-28: 50 meq via INTRAVENOUS
  Filled 2024-09-28: qty 50

## 2024-09-28 MED ORDER — SODIUM ZIRCONIUM CYCLOSILICATE 10 G PO PACK
10.0000 g | PACK | Freq: Once | ORAL | Status: DC
  Filled 2024-09-28: qty 2

## 2024-09-28 MED ORDER — MUPIROCIN 2 % EX OINT
TOPICAL_OINTMENT | Freq: Two times a day (BID) | CUTANEOUS | Status: DC
  Administered 2024-09-29: 1 via NASAL
  Filled 2024-09-28: qty 22

## 2024-09-28 MED ORDER — LACTATED RINGERS IV SOLN: INTRAVENOUS | Status: AC

## 2024-09-28 NOTE — Consult Note (Addendum)
 "                                                                                   Consultation Note Date: 09/28/2024   Patient Name: Jordan Ward  DOB: [birth year]  MRN: [id number]  Age / Sex: 83 y.o., male  PCP: Shona Norleen PEDLAR, MD Referring Physician: Danton Reyes DASEN, MD  Reason for Consultation: {Reason for Consult:23484}  HPI/Patient Profile: 83 y.o. male  with past medical history of *** admitted on 09/27/2024 with ***.   Clinical Assessment and Goals of Care: ***  Primary Decision Maker {Primary Decision Fjxzm:78612}    SUMMARY OF RECOMMENDATIONS   ***  Code Status/Advance Care Planning: {Palliative Code status:23503}   Symptom Management:  ***  Palliative Prophylaxis:  {Palliative Prophylaxis:21015}  Additional Recommendations (Limitations, Scope, Preferences): {Recommended Scope and Preferences:21019}  Psycho-social/Spiritual:  Desire for further Chaplaincy support:{YES NO:22349} Additional Recommendations: {PAL SOCIAL:21064}  Prognosis:  {Palliative Care Prognosis:23504}  Discharge Planning: {Palliative dispostion:23505}      Primary Diagnoses: Present on Admission:  Hyperkalemia   I have reviewed the medical record, interviewed the patient and family, and examined the patient. The following aspects are pertinent.  Past Medical History:  Diagnosis Date   Anemia    Arthritis    Back pain, chronic    Bilateral carotid bruits    C. difficile colitis 09/2011   CAD (coronary artery disease)    Carotid artery stenosis    Cerebrovascular disease    Cerebrovascular disease 08/17/2018   Colon polyps    Complication of cystostomy catheter, initial encounter 04/20/2020   Dyslipidemia    Dysphagia 10/07/2011   Encephalopathy    Gait disorder    HA (headache)    High grade dysplasia in colonic adenoma 09/2005   History of kidney stones    HTN (hypertension), malignant 10/06/2011   Hypernatremia    Hypokalemia    Hypothyroidism 10/08/2011    Insomnia    Junctional rhythm    Kidney stones    MS (multiple sclerosis)    Nausea and vomiting 03/02/2020   Neuromuscular disorder (HCC)    MS   OSA (obstructive sleep apnea)    Paroxysmal atrial tachycardia    Peripheral vascular disease    Pneumonia 4 yrs ago   Pulmonary fibrosis (HCC) 10/06/2011   Pulmonary nodule 10/08/2011   PVD (peripheral vascular disease)    Sacral ulcer (HCC)    Sleep apnea    cannot tolerate   Stroke (HCC)    left sided weakness   Suprapubic catheter (HCC)    TIA (transient ischemic attack)    Tremors of nervous system 10/08/2011   Urinary tract infection    Ventral hernia without obstruction or gangrene     Large 8X9cm ventral hernia with loss of domain. CT reads report as diastasis recti with herniation or diastasis recti.  Dr. Kallie, Surgery, reviewed CT with radiology and there is herniation with only hernia sac or peritoneum over the bowel and large separation of the rectus muslce (i.e. diastasis recti aka loss of domain).  No surgical intervention recommended given size, age, and health.    Social History   Socioeconomic History  Marital status: Married    Spouse name: Idell Caldron   Number of children: 2   Years of education: 10   Highest education level: Not on file  Occupational History   Occupation: disability    Comment: [employer]    Employer: RETIRED  Tobacco Use   Smoking status: Former    Current packs/day: 0.00    Average packs/day: 1 pack/day for 25.0 years (25.0 ttl pk-yrs)    Types: Cigarettes    Start date: 03/04/1963    Quit date: 03/03/1988    Years since quitting: 36.5   Smokeless tobacco: Never  Vaping Use   Vaping status: Never Used  Substance and Sexual Activity   Alcohol  use: No   Drug use: No   Sexual activity: Not on file  Other Topics Concern   Not on file  Social History Narrative   Patient is married Catherne Caldron) and  Lives w/ wife and daughter Reena   Drinks 1-2 cups caffeine daily    Patient is left  handed.    Social Drivers of Health   Tobacco Use: Medium Risk (09/27/2024)   Patient History    Smoking Tobacco Use: Former    Smokeless Tobacco Use: Never    Passive Exposure: Not on file  Financial Resource Strain: Low Risk (11/03/2023)   Received from [facility]   Overall Financial Resource Strain (CARDIA)    Difficulty of Paying Living Expenses: Not very hard  Food Insecurity: Patient Unable To Answer (09/28/2024)   Epic    Worried About Programme Researcher, Broadcasting/film/video in the Last Year: Patient unable to answer    Ran Out of Food in the Last Year: Patient unable to answer  Transportation Needs: Patient Unable To Answer (09/28/2024)   Epic    Lack of Transportation (Medical): Patient unable to answer    Lack of Transportation (Non-Medical): Patient unable to answer  Physical Activity: Inactive (11/03/2023)   Received from [facility]   Exercise Vital Sign    On average, how many days per week do you engage in moderate to strenuous exercise (like a brisk walk)?: 0 days    On average, how many minutes do you engage in exercise at this level?: 0 min  Stress: No Stress Concern Present (11/03/2023)   Received from [facility] - Occupational Stress Questionnaire    Feeling of Stress : Only a little  Social Connections: Patient Unable To Answer (09/28/2024)   Social Connection and Isolation Panel    Frequency of Communication with Friends and Family: Patient unable to answer    Frequency of Social Gatherings with Friends and Family: Patient unable to answer    Attends Religious Services: Patient unable to answer    Active Member of Clubs or Organizations: Patient unable to answer    Attends Banker Meetings: Patient unable to answer    Marital Status: Patient unable to answer  Recent Concern: Social Connections - Moderately Isolated (09/17/2024)   Social Connection and Isolation Panel    Frequency of Communication with Friends  and Family: More than three times a week    Frequency of Social Gatherings with Friends and Family: More than three times a week    Attends Religious Services: Never    Database Administrator or Organizations: No    Attends Banker Meetings: Never    Marital Status: Married  Depression (PHQ2-9): Low Risk (09/26/2022)   Depression (PHQ2-9)  PHQ-2 Score: 0  Alcohol  Screen: Not on file  Housing: Unknown (09/28/2024)   Epic    Unable to Pay for Housing in the Last Year: Patient unable to answer    Number of Times Moved in the Last Year: 0    Homeless in the Last Year: Patient unable to answer  Utilities: Patient Unable To Answer (09/28/2024)   Epic    Threatened with loss of utilities: Patient unable to answer  Health Literacy: High Risk (11/03/2023)   Received from [facility]    How often do you need to have someone help you when you read instructions, pamphlets, or other written material from your doctor or pharmacy?: Often   Family History  Problem Relation Age of Onset   Cirrhosis Brother        etoh   Stroke Mother 67   Coronary artery disease Father 24   Heart attack Brother    Cancer Sister    Multiple sclerosis Other    Colon cancer Neg Hx    Scheduled Meds:  Chlorhexidine  Gluconate Cloth  6 each Topical Daily   mupirocin  ointment   Nasal BID   pantoprazole  (PROTONIX ) IV  40 mg Intravenous Q24H   sodium zirconium cyclosilicate  10 g Oral Daily   Continuous Infusions:  [START ON 10-02-2024] ceFEPime  (MAXIPIME ) IV     lactated ringers  125 mL/hr at 09/28/24 1544   [START ON 09/30/2024] vancomycin      PRN Meds:. Allergies[1] Review of Systems  Physical Exam  Vital Signs: BP (!) 95/55   Pulse 68   Temp 98.4 F (36.9 C) (Axillary)   Resp 18   Ht 5' 5 (1.651 m)   Wt 45.4 kg   SpO2 95%   BMI 16.64 kg/m  Pain Scale: PAINAD   Pain Score: Asleep   SpO2: SpO2: 95 % O2 Device:SpO2: 95 % O2 Flow Rate: .O2 Flow Rate (L/min): 14  L/min  IO: Intake/output summary:  Intake/Output Summary (Last 24 hours) at 09/28/2024 1547 Last data filed at 09/28/2024 1544 Gross per 24 hour  Intake 1502.54 ml  Output --  Net 1502.54 ml    LBM:   Baseline Weight: Weight: 45.4 kg Most recent weight: Weight: 45.4 kg     Palliative Assessment/Data:     Time In: *** Time Out: *** Time Total: *** Greater than 50%  of this time was spent counseling and coordinating care related to the above assessment and plan.  Signed by: Bernarda Kitty, NP Palliative Medicine Team Pager # [phone] (M-F 8a-5p) Team Phone # [phone] (Nights/Weekends)                    [1]  Allergies Allergen Reactions   Ciprofloxacin  Other (See Comments)    Trouble swallowing according to wife Clemens out in the floor Pt can tolerate Levaquin    Tetracyclines & Related Anaphylaxis and Rash   "

## 2024-09-28 NOTE — Plan of Care (Signed)
" °  Problem: Education: Goal: Knowledge of General Education information will improve Description: Including pain rating scale, medication(s)/side effects and non-pharmacologic comfort measures Outcome: Not Progressing   Problem: Health Behavior/Discharge Planning: Goal: Ability to manage health-related needs will improve Outcome: Not Progressing   Problem: Clinical Measurements: Goal: Ability to maintain clinical measurements within normal limits will improve Outcome: Not Progressing Goal: Will remain free from infection Outcome: Not Progressing Goal: Diagnostic test results will improve Outcome: Progressing Goal: Respiratory complications will improve Outcome: Progressing Goal: Cardiovascular complication will be avoided Outcome: Progressing   Problem: Activity: Goal: Risk for activity intolerance will decrease Outcome: Not Progressing   Problem: Nutrition: Goal: Adequate nutrition will be maintained Outcome: Progressing   Problem: Coping: Goal: Level of anxiety will decrease Outcome: Progressing   Problem: Elimination: Goal: Will not experience complications related to bowel motility Outcome: Not Progressing Goal: Will not experience complications related to urinary retention Outcome: Progressing   Problem: Pain Managment: Goal: General experience of comfort will improve and/or be controlled Outcome: Progressing   Problem: Safety: Goal: Ability to remain free from injury will improve Outcome: Progressing   Problem: Skin Integrity: Goal: Risk for impaired skin integrity will decrease Outcome: Not Progressing   Problem: Activity: Goal: Ability to tolerate increased activity will improve Outcome: Not Progressing   Problem: Clinical Measurements: Goal: Ability to maintain a body temperature in the normal range will improve Outcome: Progressing   Problem: Respiratory: Goal: Ability to maintain adequate ventilation will improve Outcome: Progressing Goal:  Ability to maintain a clear airway will improve Outcome: Progressing   "

## 2024-09-28 NOTE — Progress Notes (Addendum)
 "  Jordan Ward  FMW:[id number] DOB: [birth year] DOA: 09/27/2024 PCP: Shona Norleen PEDLAR, MD    Brief Narrative:  83 year old SNF resident [facility]) with a history of multiple sclerosis with neurogenic bladder and longstanding suprapubic tube with atrophic right kidney and left percutaneous nephrostomy tube, pulmonary fibrosis, COPD, CAD, CVA, hypothyroidism, and chronic pressure ulcers who presented to the ER for evaluation of hyperkalemia after he was found to have a potassium of 7.3 via lab work obtained at his facility.  He was somnolent at presentation and unable to provide a reliable history.  CTabdom/pelvis at presentation revealed small bilateral pleural effusions but bibasilar atelectasis, a left nephrostomy tube which has become withdrawn from its original position, marked chronic atrophy of the right kidney, and a suprapubic catheter with a decompressed bladder.  Goals of Care:   Code Status: Limited: Do not attempt resuscitation (DNR) -DNR-LIMITED -Do Not Intubate/DNI    DVT prophylaxis: SCDs Start: 09/28/24 0713   Interim Hx: The patient was examined and interviewed by one of my partners earlier today.  Afebrile since presentation.  Blood pressure variable with systolics 80-112.  Oxygen  saturation stable at 93-100% on 3-5 L nasal cannula support.  I went back to see the patient for a follow-up visit.  At the time of my visit he is obtunded but does not appear uncomfortable.  He is tachypneic but is moving good air in bilateral lung fields.  There is no evidence of uncontrolled pain or distress otherwise.  I had a conversation with his nurse who informed me that his SNF sent over paperwork indicating that he was DNR/NCB and also further explained to her that consideration was being given to transitioning the patient to full comfort care.  Given his overall condition and acute issues at present I feel this would be most appropriate.  I am asking Palliative Care to see the patient in  consultation to investigate this option further.  In the meantime, since we have been able to confirm his no CODE BLUE status, we have changed his status to DNR here at this facility.  Assessment & Plan:  Multiple sclerosis with neurogenic bladder, obstructive uropathy, and chronic nephrostomy tube Records indicate the patient's nephrostomy tube was exchanged by IR 09/08/2024 - CT at presentation suggest possible malposition - at present there is a lot of urine in the nephrostomy bag/it is clearly still draining - we will just monitor for now while goals of care are being more firmly established - should the family decide to continue to pursue active tx we will ask IR to review and comment  Possible PNA CT abdom incidentally noted bibasilar infiltrates - I am not convinced this represents a true bacterial pneumonia, but we will cont broad abx coverage for now and follow clinically while GOC are being establihsed.   Hyperkalemia No significant EKG changes at time of presentation - initiate Lokelma when able to take orals and continue temporizing measures as needed  Transaminitis AST 1858 and ALT 559 at time of presentation with total bilirubin of 1.5 - AST and ALT were normal 09/17/2024 - no remarkable liver findings on CT abdomen -acetaminophen  level <10 -acute viral hepatitis panel pending - suspect this is shock liver   Lactic acidosis Etiology not completely clear -potentially due to pneumonia with bilateral pulmonary infiltrates suggested on CT abdomen -follow trend with ongoing hydration - may simply be due to severe dehydration/hypovolemia   AKI on CKD stage IV Appears to be due to prerenal state with likely  component of ATN as well - baseline creatinine approximately 2.0 with presenting creatinine 2.7 - monitor with hydration  Underweight - Body mass index is 16.64 kg/m.  Acute thrombocytopenia Platelet count 80 at presentation - no acute bleeding appreciated -platelet count normal  at baseline - monitor trend   Chronic combined diastolic and systolic CHF TTE January 2025 noted EF 30-35% with global LV hypokinesis and grade 1 DD - at present the patient appears volume depleted and not volume overloaded therefore he is not in an acute CHF exacerbation  Pulmonary fibrosis with COPD and chronic hypoxic respiratory failure Pulm as needed oxygen  at baseline  Hypothyroidism Continue usual Synthroid  dose  PAD Continue usual aspirin   Multiple chronic pressure ulcers POA Care as per [facility] Wound 09/18/24 2230 Pressure Injury Heel Distal;Left;Posterior Unstageable - Full thickness tissue loss in which the base of the injury is covered by slough (yellow, tan, gray, green or brown) and/or eschar (tan, brown or black) in the wound bed. (Active)     Wound 09/18/24 2200 Pressure Injury Sacrum Medial Stage 3 -  Full thickness tissue loss. Subcutaneous fat may be visible but bone, tendon or muscle are NOT exposed. (Active)        Family Communication: no family present at time of f/u exam Disposition:  pending  goals of care decision    Objective: Blood pressure 115/76, pulse 63, temperature 98.4 F (36.9 C), temperature source Axillary, resp. rate 15, height 5' 5 (1.651 m), weight 45.4 kg, SpO2 99%.  Intake/Output Summary (Last 24 hours) at 09/28/2024 1031 Last data filed at 09/28/2024 0451 Gross per 24 hour  Intake 200 ml  Output --  Net 200 ml   Filed Weights   09/27/24 2354  Weight: 45.4 kg    Examination: The patient was seen for follow-up exam as noted above.  CBC: Recent Labs  Lab 09/28/24 0047  WBC 20.1*  NEUTROABS 17.3*  HGB 10.3*  HCT 34.9*  MCV 97.2  PLT 80*   Basic Metabolic Panel: Recent Labs  Lab 09/28/24 0047 09/28/24 0231 09/28/24 0827  NA 141  --   --   K 7.1*  --  6.7*  CL 105  --   --   CO2 19*  --   --   GLUCOSE 80  --   --   BUN 47*  --   --   CREATININE 2.71*  --   --   CALCIUM  8.7*  --   --   MG  --  2.7*  --   PHOS   --  5.6*  --    GFR: Estimated Creatinine Clearance: 13.7 mL/min (A) (by C-G formula based on SCr of 2.71 mg/dL (H)).   Scheduled Meds:  Chlorhexidine  Gluconate Cloth  6 each Topical Daily   feeding supplement  237 mL Oral BID BM   sodium zirconium cyclosilicate  10 g Oral Once   Continuous Infusions:  [START ON 10/28/2024] ceFEPime  (MAXIPIME ) IV     lactated ringers  125 mL/hr at 09/28/24 0957   [START ON 09/30/2024] vancomycin        LOS: 0 days   Reyes IVAR Moores, MD [facility] Office  [phone] Pager - Text Page per Tracey  If 7PM-7AM, please contact night-coverage per Amion 09/28/2024, 10:31 AM     "

## 2024-09-28 NOTE — Progress Notes (Signed)
 Pharmacy Antibiotic Note  Jordan Ward is a 83 y.o. male admitted on 09/27/2024 with pneumonia.  Pharmacy has been consulted for vancomycin  dosing.  Plan: Vancomycin  1000 mg IV x 1 dose. Vancomycin  500 mg IV every 48 hours. Cefepime  2000 mg IV every 24 hours.  Monitor labs, c/s, and vanco levels as indicated.  Height: 5' 5 (165.1 cm) Weight: 45.4 kg (100 lb) IBW/kg (Calculated) : 61.5  Temp (24hrs), Avg:98.4 F (36.9 C), Min:98.1 F (36.7 C), Max:98.5 F (36.9 C)  Recent Labs  Lab 09/28/24 0047 09/28/24 0231 09/28/24 0442  WBC 20.1*  --   --   CREATININE 2.71*  --   --   LATICACIDVEN 6.7* 5.2* 4.6*    Estimated Creatinine Clearance: 13.7 mL/min (A) (by C-G formula based on SCr of 2.71 mg/dL (H)).    Allergies[1]  Antimicrobials this admission: Vanco 12/31 >> Cefepime  12/31 >>   Microbiology results: 12/31 BCx: pending 12/31 UCx: pending  12/31 Sputum: pending  12/31 MRSA PCR: pending  Thank you for allowing pharmacy to be a part of this patients care.  Elspeth Sour, PharmD Clinical Pharmacist 09/28/2024 8:21 AM     [1]  Allergies Allergen Reactions   Ciprofloxacin  Other (See Comments)    Trouble swallowing according to wife Clemens out in the floor Pt can tolerate Levaquin    Tetracyclines & Related Anaphylaxis and Rash

## 2024-09-28 NOTE — H&P (Addendum)
 " History and Physical    Patient: Jordan Ward FMW:[id number] DOB: Sep 13, 1941 DOA: 09/27/2024 DOS: the patient was seen and examined on 09/28/2024 PCP: Shona Norleen PEDLAR, MD  Patient coming from: SNF  Chief Complaint:  Chief Complaint  Patient presents with   abnormal labs   HPI: Jordan Ward is a 83 y.o. male with medical history significant of multiple sclerosis with neurogenic bladder and with longstanding suprapubic tube with atrophic right kidney and a left percutaneous nephrostomy tube, pulmonary fibrosis/COPD, CAD, prior stroke, hypothyroidism, pressure ulcers who presents to the emergency department from [facility] due to elevated potassium.  At bedside, patient was somnolent, though arousable with sternal rub, but was unable to provide any history and quickly goes back to sleep.  History was obtained from EDP.  Per report, patient was sent to the ED for evaluation due to lab done at a facility with potassium of 7.3.  Patient was unable to provide more history at this time.  ED course In the emergency department, he was tachypneic, maintaining 100% on 3 LPM of oxygen .  Workup in the ED showed WBC 20.1, hemoglobin 10.3, hematocrit 34.9, MCV 91.2, platelets 80.  BMP sodium 141, potassium 7.4 chloride 105, bicarb 19, glucose 80, BUN 47, creatinine 2.71, albumin 2.5, AST 1858, ALT 589, ALP 248, bilirubin 1.5, eGFR 23.  Lactic acid 6.7 > 5.2.  Urinalysis was positive for moderate hematuria and leukocytes. CT abdominal pelvis without contrast was suggestive of small bilateral pleural effusion likely related to multifocal infection.  Left Nephrostomy tube remains in place though the catheter has been withdrawn into the access calyx. EKG personally reviewed showed normal sinus rhythm at a rate of 76 bpm He was empirically treated with cefepime  and vancomycin .  Insulin  and dextrose  were given.    Review of Systems: As mentioned in the history of present illness. All other systems reviewed and  are negative. Past Medical History:  Diagnosis Date   Anemia    Arthritis    Back pain, chronic    Bilateral carotid bruits    C. difficile colitis 09/2011   CAD (coronary artery disease)    Carotid artery stenosis    Cerebrovascular disease    Cerebrovascular disease 08/17/2018   Colon polyps    Complication of cystostomy catheter, initial encounter 04/20/2020   Dyslipidemia    Dysphagia 10/07/2011   Encephalopathy    Gait disorder    HA (headache)    High grade dysplasia in colonic adenoma 09/2005   History of kidney stones    HTN (hypertension), malignant 10/06/2011   Hypernatremia    Hypokalemia    Hypothyroidism 10/08/2011   Insomnia    Junctional rhythm    Kidney stones    MS (multiple sclerosis)    Nausea and vomiting 03/02/2020   Neuromuscular disorder (HCC)    MS   OSA (obstructive sleep apnea)    Paroxysmal atrial tachycardia    Peripheral vascular disease    Pneumonia 4 yrs ago   Pulmonary fibrosis (HCC) 10/06/2011   Pulmonary nodule 10/08/2011   PVD (peripheral vascular disease)    Sacral ulcer (HCC)    Sleep apnea    cannot tolerate   Stroke (HCC)    left sided weakness   Suprapubic catheter (HCC)    TIA (transient ischemic attack)    Tremors of nervous system 10/08/2011   Urinary tract infection    Ventral hernia without obstruction or gangrene     Large 8X9cm ventral hernia with loss  of domain. CT reads report as diastasis recti with herniation or diastasis recti.  Dr. Kallie, Surgery, reviewed CT with radiology and there is herniation with only hernia sac or peritoneum over the bowel and large separation of the rectus muslce (i.e. diastasis recti aka loss of domain).  No surgical intervention recommended given size, age, and health.    Past Surgical History:  Procedure Laterality Date   APPENDECTOMY  09/2005   at time of left hemicolectomy   BACK SURGERY  1976/1979   lower   BILIARY DILATION N/A 03/03/2020   Procedure: BILIARY DILATION;   Surgeon: Golda Claudis PENNER, MD;  [facility];  Service: Endoscopy;  Laterality: N/A;   CATARACT EXTRACTION W/PHACO Right 03/08/2018   Procedure: CATARACT EXTRACTION PHACO AND INTRAOCULAR LENS PLACEMENT RIGHT EYE;  Surgeon: Perley Hamilton, MD;  Location: [facility];  Service: Ophthalmology;  Laterality: Right;  CDE: 8.86   CATARACT EXTRACTION W/PHACO Left 04/05/2018   Procedure: CATARACT EXTRACTION PHACO AND INTRAOCULAR LENS PLACEMENT (IOC);  Surgeon: Perley Hamilton, MD;  Location: [facility];  Service: Ophthalmology;  Laterality: Left;  CDE: 7.36   CENTRAL LINE INSERTION-TUNNELED Right 09/11/2020   Procedure: PLACEMENT OF TUNNELED CENTRAL LINE INTO JUGULAR VEIN;  Surgeon: Kallie Manuelita BROCKS, MD;  Location: [facility];  Service: General;  Laterality: Right;   CHOLECYSTECTOMY     Dr. Claudene   COLON SURGERY  09/2005   Fleishman: four tubular adenomas, large adenomatous polyp with HIGH GRADE dysplasia   COLONOSCOPY  11/2004   Dr. Lendon sessile polyp splenic flexure, 10mm sessile polyp desc colon, tubulovillous adenoma (bx not removed)   COLONOSCOPY  01/2005   poor prep, polyp could not be found   COLONOSCOPY  05/2005   with EMR, polypectomy Dr. Norleen Dec, bx showed high grade dysplasia, partially resected   COLONOSCOPY  09/2005   Dr. Dec, [place] ink tattooing, four villous colon polyp (3 had been missed on previous colonoscopies due to limitations of procedures   COLONOSCOPY  09/2006   normal TI, no polyps   COLONOSCOPY  10/2007   Dr. Randol distal mammillations, benign bx, normal TI, random bx neg for microscopic colitis   CYSTOSCOPY W/ URETERAL STENT PLACEMENT Left 09/05/2020   Procedure: CYSTOSCOPY WITH RETROGRADE PYELOGRAM/URETERAL STENT PLACEMENT;  Surgeon: Cam Morene ORN, MD;  [facility]: [facility];  Service: Urology;  Laterality: Left;   CYSTOSCOPY WITH LITHOLAPAXY N/A 07/27/2018   Procedure: CYSTOSCOPY WITH LITHOLAPAXY VIA  SUPRAPUBIC TUBE;  Surgeon: Matilda Senior, MD;   Location: [facility];  Service: Urology;  Laterality: N/A;   CYSTOSCOPY WITH RETROGRADE PYELOGRAM, URETEROSCOPY AND STENT PLACEMENT Left 06/09/2017   Procedure: CYSTOSCOPY WITH LEFT RETROGRADE PYELOGRAM, LEFT URETEROSCOPY, LEFT URETEROSCOPIC STONE EXTRACTION, LEFT URETERAL STENT PLACEMENT;  Surgeon: Matilda Senior, MD;  Location: [facility];  Service: Urology;  Laterality: Left;   CYSTOSCOPY/URETEROSCOPY/HOLMIUM LASER/STENT PLACEMENT Left 05/22/2020   Procedure: CYSTOSCOPY/URETEROSCOPY/STENT PLACEMENT;  Surgeon: Matilda Senior, MD;  [facility];  Service: Urology;  Laterality: Left;   ERCP N/A 03/03/2020   Procedure: ENDOSCOPIC RETROGRADE CHOLANGIOPANCREATOGRAPHY (ERCP);  Surgeon: Golda Claudis PENNER, MD;  [facility];  Service: Endoscopy;  Laterality: N/A;   ERCP N/A 04/20/2020   Procedure: ENDOSCOPIC RETROGRADE CHOLANGIOPANCREATOGRAPHY (ERCP);  Surgeon: Golda Claudis PENNER, MD;  [facility];  Service: Endoscopy;  Laterality: N/A;  to be done at 7:30am in OR   GASTROINTESTINAL STENT REMOVAL N/A 04/20/2020   Procedure: STENT REMOVAL;  Surgeon: Golda Claudis PENNER, MD;  [facility];  Service:  Endoscopy;  Laterality: N/A;   INGUINAL HERNIA REPAIR  1971   bilateral   INSERTION OF SUPRAPUBIC CATHETER  06/09/2017   Procedure: EXCHANGE OF SUPRAPUBIC CATHETER;  Surgeon: Matilda Senior, MD;  [facility]: [facility];  Service: Urology;;   INSERTION OF SUPRAPUBIC CATHETER  05/22/2020   Procedure: SUPRAPUBIC CATHETER EXCHANGE;  Surgeon: Matilda Senior, MD;  [facility];  Service: Urology;;   IR NEPHROSTOMY EXCHANGE LEFT  03/25/2021   IR NEPHROSTOMY EXCHANGE LEFT  05/20/2021   IR NEPHROSTOMY EXCHANGE LEFT  07/13/2021   IR NEPHROSTOMY EXCHANGE LEFT  09/09/2021   IR NEPHROSTOMY EXCHANGE LEFT  11/04/2021   IR NEPHROSTOMY EXCHANGE LEFT  12/23/2021   IR NEPHROSTOMY EXCHANGE LEFT  02/17/2022   IR NEPHROSTOMY EXCHANGE LEFT  04/14/2022   IR NEPHROSTOMY EXCHANGE LEFT  06/09/2022   IR  NEPHROSTOMY EXCHANGE LEFT  07/28/2022   IR NEPHROSTOMY EXCHANGE LEFT  09/09/2022   IR NEPHROSTOMY EXCHANGE LEFT  11/04/2022   IR NEPHROSTOMY EXCHANGE LEFT  12/30/2022   IR NEPHROSTOMY EXCHANGE LEFT  02/25/2023   IR NEPHROSTOMY EXCHANGE LEFT  03/31/2023   IR NEPHROSTOMY EXCHANGE LEFT  05/26/2023   IR NEPHROSTOMY EXCHANGE LEFT  07/21/2023   IR NEPHROSTOMY EXCHANGE LEFT  09/15/2023   IR NEPHROSTOMY EXCHANGE LEFT  12/22/2023   IR NEPHROSTOMY EXCHANGE LEFT  03/25/2024   IR NEPHROSTOMY EXCHANGE LEFT  04/28/2024   IR NEPHROSTOMY EXCHANGE LEFT  06/02/2024   IR NEPHROSTOMY EXCHANGE LEFT  09/08/2024   IR NEPHROSTOMY PLACEMENT LEFT  05/26/2017   IR NEPHROSTOMY PLACEMENT LEFT  05/02/2020   IR NEPHROSTOMY PLACEMENT LEFT  02/15/2021   IR NEPHROSTOMY PLACEMENT LEFT  02/22/2024   IR NEPHROSTOMY TUBE CHANGE  10/29/2023   IR URETERAL STENT PERC REMOVAL MOD SED  03/25/2021   KIDNEY STONE SURGERY  09/13/2015   LITHOTRIPSY N/A 03/03/2020   Procedure: MECHANICAL LITHOTRIPSY WITH REMOVAL OF MULTIPLE STONE FRAGMENTS;  Surgeon: Golda Claudis PENNER, MD;  [facility];  Service: Endoscopy;  Laterality: N/A;   NEPHROLITHOTOMY Left 09/13/2015   Procedure: LEFT PERCUTANEOUS NEPHROLITHOTOMY ;  Surgeon: Senior Matilda, MD;  Location: [facility];  Service: Urology;  Laterality: Left;   NEPHROSTOMY TUBE REMOVAL  05/22/2020   Procedure: NEPHROSTOMY TUBE REMOVAL;  Surgeon: Matilda Senior, MD;  Location: [facility];  Service: Urology;;   [facility] REMOVAL Right 09/20/2020   Procedure: MINOR REMOVAL CENTRAL LINE;  Surgeon: Kallie Manuelita BROCKS, MD;  Location: [facility];  Service: General;  Laterality: Right;  Pt to arrive at 7:30am for procedure   REMOVAL OF STONES N/A 04/20/2020   Procedure: REMOVAL OF STONES;  Surgeon: Golda Claudis PENNER, MD;  [facility];  Service: Endoscopy;  Laterality: N/A;   SPHINCTEROTOMY  03/03/2020   Procedure: BILLARY SPHINCTEROTOMY;  Surgeon: Golda Claudis PENNER, MD;  [facility];  Service:  Endoscopy;;   SUPRAPUBIC CATHETER INSERTION     Social History:  reports that he quit smoking about 36 years ago. His smoking use included cigarettes. He started smoking about 61 years ago. He has a 25 pack-year smoking history. He has never used smokeless tobacco. He reports that he does not drink alcohol  and does not use drugs.  Allergies[1]  Family History  Problem Relation Age of Onset   Cirrhosis Brother        etoh   Stroke Mother 54   Coronary artery disease Father 29   Heart attack Brother    Cancer Sister    Multiple sclerosis Other  Colon cancer Neg Hx     Prior to Admission medications  Medication Sig Start Date End Date Taking? Authorizing Provider  acetaminophen  (TYLENOL ) 325 MG tablet Take 650 mg by mouth every 4 (four) hours as needed for mild pain (pain score 1-3) or fever. Do not exceed 3000 mg in 24 hours.    [provider]  Amino Acids-Protein Hydrolys (PRO-STAT PROFILE PO) Take 30 mLs by mouth 2 (two) times daily.    [provider]  ascorbic acid  (VITAMIN C ) 500 MG tablet Take 500 mg by mouth every morning.    [provider]  aspirin  81 MG chewable tablet Chew 1 tablet (81 mg total) by mouth daily with breakfast. 02/02/24   Pearlean, Courage, MD  busPIRone  (BUSPAR ) 5 MG tablet Take 1 tablet (5 mg total) by mouth 3 (three) times daily. 09/20/24   Maree, Pratik D, DO  diazepam  (VALIUM ) 2 MG tablet Take 1 tablet (2 mg total) by mouth 2 (two) times daily. 09/20/24   Maree, Pratik D, DO  HYDROcodone -acetaminophen  (NORCO/VICODIN) 5-325 MG tablet Take 1 tablet by mouth every 8 (eight) hours as needed for moderate pain (pain score 4-6) or severe pain (pain score 7-10). 09/20/24   Maree, Pratik D, DO  levothyroxine  (SYNTHROID ) 75 MCG tablet Take 75 mcg by mouth in the morning. 07/02/21   [provider]  lidocaine  (LIDODERM ) 5 % Place 1 patch onto the skin daily. Remove & Discard patch within 12 hours or as directed by MD    [provider]  lidocaine  4 % Place 1 patch onto the skin every 12 (twelve) hours. Directions per [facility]:? Apply to right shoulder for up to 12 hours then apply at bedtime for pain and remove at 10 pm Application times: 0800, 2200    [provider]  loratadine  (CLARITIN ) 10 MG tablet Take 10 mg by mouth at bedtime. 07/04/24 09/17/24  [provider]  melatonin 3 MG TABS tablet Take 3 mg by mouth at bedtime.    [provider]  methocarbamol  (ROBAXIN ) 500 MG tablet Take 1 tablet (500 mg total) by mouth every 6 (six) hours as needed for muscle spasms. 09/20/24   Maree, Pratik D, DO  mirtazapine  (REMERON ) 15 MG tablet Take 0.5 tablets (7.5 mg total) by mouth at bedtime. Patient taking differently: Take 15 mg by mouth at bedtime. 02/02/24   Pearlean Manus, MD  Multiple Vitamin (MULTIVITAMIN) tablet Take 1 tablet by mouth every morning.    [provider]  omeprazole  (PRILOSEC  OTC) 20 MG tablet Take 20 mg by mouth daily.    [provider]  oxybutynin  (DITROPAN ) 5 MG tablet Take 5 mg by mouth 2 (two) times daily.    [provider]  OXYGEN  Inhale 1 L into the lungs as needed (shortness of breath, O2 sats <88%).    [provider]  polyethylene glycol (MIRALAX  / GLYCOLAX ) 17 g packet Take 17 g by mouth every morning.    [provider]  potassium chloride  (KLOR-CON ) 20 MEQ packet Take 20 mEq by mouth daily.    [provider]  senna-docusate (SENOKOT-S) 8.6-50 MG tablet Take 2 tablets by mouth at bedtime. 02/02/24   Pearlean Manus, MD  sodium chloride  (OCEAN) 0.65 % SOLN nasal spray Place 1 spray into both nostrils in the morning, at noon, in the evening, and at bedtime. For nasal dryness    [provider]  thiamine  (VITAMIN B-1) 100 MG tablet Take 1 tablet (100 mg total) by  mouth daily. 10/12/23   Maree Bracken D, DO    Physical Exam: Vitals:   09/28/24 0330 09/28/24 0345 09/28/24 0443 09/28/24 0600  BP: (!) 88/73  90/64 92/66 97/63   Pulse: 69 65 69 67  Resp: 18 19 19  (!) 23  Temp:   98.1 F (36.7 C) 98.5 F (36.9 C)  TempSrc:   Axillary Oral  SpO2: 100% 99% 97% 95%  Weight:      Height:       General: Elderly male.  Somnolent, though easily arousable, quickly goes back to sleep.  He was not in any acute distress.  HEENT: NCAT.  PERRL. Sclerae anicteric.  Dry mucosal membranes. Neck: Neck supple without lymphadenopathy. No carotid bruits. No masses palpated.  Cardiovascular: Regular rate with normal S1-S2 sounds. No murmurs, rubs or gallops auscultated. No JVD.  Respiratory: Clear breath sounds.  No accessory muscle use. Abdomen: Soft, nontender, nondistended. Active bowel sounds. No masses or hepatosplenomegaly  Skin: No rashes, lesions, or ulcerations.  Dry, warm to touch. Musculoskeletal:  2+ dorsalis pedis and radial pulses. Good ROM.  No contractures  Psychiatric:  Mood appropriate to current condition. Neurologic: No focal neurological deficits.   Assessment and Plan: Hyperkalemia EKG personally reviewed showed normal sinus rhythm at a rate of 76 bpm IV calcium  gluconate 1 g was given to stabilize the heart Insulin  and dextrose , sodium bicarbonate  were given Continue telemetry and continue to monitor potassium level  Transaminasemia AST 1858, ALT 559, ALP 248, total bilirubin 1.5 No known cause of elevated liver enzymes at this time, though, the lactic acidemia may contribute slightly to this effect. CT abdomen pelvis states that the liver is unremarkable Hepatitis panel and Tylenol  level will be checked Gastroenterology will be consulted and await further recommendations  Lactic acidosis possibly due to multifactorial Possible pneumonia CT abdomen and pelvis shows small bilateral pleural effusion with bibasilar atelectasis and superimposed adjacent airspace infiltrates suggestive of multifocal infection Patient was started on cefepime  and vancomycin , we shall continue same at this  time with plan to de-escalate/discontinue based on blood culture, sputum culture, urine Legionella, strep pneumo Continue Tylenol  as needed Continue Mucinex , incentive spirometry, flutter valve  Lactic acid 6.7 > 5.2 > 4.6 Continue gentle hydration and continue to trend lactic acid.  Acute kidney injury superimposed on CKD stage IV Dehydration Creatinine 2.71 (baseline creatinine at 2.0-2.2) Continue gentle hydration Renally adjust medications, avoid nephrotoxic agents/dehydration/hypotension  Underweight (BMI 16.64) Deconditioning/multiple sclerosis Failure to thrive Hypoalbuminemia possibly secondary to moderate protein calorie malnutrition Protein supplement will be provided Dietitian will be consulted  Acute thrombocytopenia Platelets 80.  Immature platelet fraction will be checked  Chronic combined HFrEF and HFpEF Echo done in January 2025 showed EF of 30 to 35%.  LV demonstrates global hypokinesis.  G1 DD Patient is without clinical evidence of volume overload  COPD, pulmonary fibrosis, chronic hypoxic respiratory failure Patient is on oxygen  as needed  Continue scheduled DuoNebs and as needed albuterol    GERD Continue with PPI   Acquired hypothyroidism Continue levothyroxine   PAD Continue aspirin   Multiple pressure ulcers, chronic, POA  Sacral, buttocks pressure ulcers Wound care will be consulted  Neurogenic bladder, obstructive uropathy, chronic percutaneous nephrostomy tube Nephrostomy tube was exchanged by IR on 09/08/2024 per medical record Order placed for prepubic catheter change   Advance Care Planning: Full code  Consults: Gastroenterology, dietitian  Family Communication: None at bedside  Severity of Illness: The appropriate patient status for this patient is INPATIENT. Inpatient status is judged to  be reasonable and necessary in order to provide the required intensity of service to ensure the patient's safety. The patient's presenting symptoms,  physical exam findings, and initial radiographic and laboratory data in the context of their chronic comorbidities is felt to place them at high risk for further clinical deterioration. Furthermore, it is not anticipated that the patient will be medically stable for discharge from the hospital within 2 midnights of admission.   * I certify that at the point of admission it is my clinical judgment that the patient will require inpatient hospital care spanning beyond 2 midnights from the point of admission due to high intensity of service, high risk for further deterioration and high frequency of surveillance required.*  Author: Virgie Kunda, DO 09/28/2024 7:03 AM  For on call review [email or web address].      [1]  Allergies Allergen Reactions   Ciprofloxacin  Other (See Comments)    Trouble swallowing according to wife Clemens out in the floor Pt can tolerate Levaquin    Tetracyclines & Related Anaphylaxis and Rash   "

## 2024-09-28 NOTE — TOC Initial Note (Signed)
 Transition of Care [facility]) - Initial/Assessment Note    Patient Details  Name: Jordan Ward MRN: [id number] Date of Birth: [birth year]  Transition of Care [facility]) CM/SW Contact:    Jordan Ward, LCSWA Phone Number: 09/28/2024, 10:52 AM  Clinical Narrative:                   Patient is high risk for readmission and was admitted for Hyperkalemia . CSW spoke with spouse to assess patient. Patient has been a long-term resident at [facility] for 9 months and plans are for him to return back once medically stable. Spouse reports that patient recently has been bed bound and staff assist with all his ADL's. Spouse reports that patient will do good and then decline again. Spouse asked about patient Code status and CSW updated her that it was DNR-Limited , spouse agreed and stated ,  that is correct, just wanted to make sure. ICM will continue to follow.   Expected Discharge Plan: Long Term Nursing Home Barriers to Discharge: Continued Medical Work up   Patient Goals and CMS Choice Patient states their goals for this hospitalization and ongoing recovery are:: return back to CV          Expected Discharge Plan and Services In-house Referral: Clinical Social Work   Post Acute Care Choice: Nursing Home Living arrangements for the past 2 months: Skilled Nursing Facility                                      Prior Living Arrangements/Services Living arrangements for the past 2 months: Skilled Nursing Facility Lives with:: Facility Resident Patient language and need for interpreter reviewed:: Yes Do you feel safe going back to the place where you live?: Yes      Need for Family Participation in Patient Care: Yes (Comment) Care giver support system in place?: Yes (comment) Current home services: DME Criminal Activity/Legal Involvement Pertinent to Current Situation/Hospitalization: No - Comment as needed  Activities of Daily Living      Permission Sought/Granted       Share Information with NAME: Jordan Ward     Permission granted to share info w Relationship: Spouse     Emotional Assessment         Alcohol  / Substance Use: Not Applicable Psych Involvement: No (comment)  Admission diagnosis:  Hyperkalemia [E87.5] Urinary tract infection without hematuria, site unspecified [N39.0] Patient Active Problem List   Diagnosis Date Noted   Hyperkalemia 09/28/2024   Lactic acidosis 09/17/2024   Acute respiratory failure with hypoxia (HCC) 07/06/2024   Acute hypoxemic respiratory failure (HCC) 07/06/2024   AKI (acute kidney injury) 01/30/2024   Dyslipidemia 10/29/2023   Anxiety 10/29/2023   Flank pain 10/29/2023   Displacement of nephrostomy tube 10/28/2023   COVID-19 09/30/2023   CKD (chronic kidney disease) stage 4, GFR 15-29 ml/min (HCC) 09/29/2023   Nephrostomy present (HCC) 09/29/2023   Emphysema lung (HCC) 09/29/2023   Right renal atrophy 07/30/2023   Hypokalemia 05/19/2022   Pressure ulcer, heel 07/14/2021   Rigors 03/07/2021   Encounter for removal of vascular catheter 09/14/2020   Pneumonia due to COVID-19 virus 07/12/2020   Bacteremia due to Gram-negative bacteria 05/04/2020   Hydronephrosis 05/02/2020   Dehydration 03/02/2020   Nausea and vomiting 03/02/2020   Ventral hernia without obstruction or gangrene 03/01/2020   Hypotension 03/01/2020   Poor appetite 03/01/2020   Detrusor areflexia 09/16/2019  Cerebrovascular disease 08/17/2018   Partial small bowel obstruction (HCC) 07/11/2017   Choledocholithiasis 07/11/2017   Paroxysmal atrial tachycardia 06/14/2017   Hypophosphatemia 06/14/2017   Pressure injury of skin 05/26/2017   Hydronephrosis due to obstruction of ureter 05/26/2017   Obstructive uropathy 05/25/2017   CKD (chronic kidney disease), stage III (HCC) 08/23/2016   Hydronephrosis of left kidney 08/23/2016   Kidney stones 09/13/2015   Essential hypertension    Pressure ulcer 08/04/2015   Chronic diastolic (congestive)  heart failure (HCC) 08/04/2015   Hydronephrosis with obstructing calculus 08/04/2015   Occult blood positive stool 09/05/2014   Edema 08/19/2014   Dilated cardiomyopathy (HCC) 07/21/2014   Headache 03/13/2014   Cerebral infarction (HCC) 03/13/2014   Abnormality of gait 03/13/2014   Tremors of nervous system 10/08/2011   Hypothyroidism 10/08/2011   Pulmonary nodule 10/08/2011   Dysphagia 10/07/2011   HTN (hypertension), malignant 10/06/2011   Chronic suprapubic catheter (HCC) 10/06/2011   Pulmonary fibrosis (HCC) 10/06/2011   UTI (urinary tract infection) 10/03/2011   Junctional rhythm 10/02/2011   Multiple sclerosis 10/02/2011   Chronic diarrhea 06/17/2011   Hx of adenomatous colonic polyps 06/17/2011   High grade dysplasia in colonic adenoma 09/29/2005   PCP:  Jordan Norleen PEDLAR, MD Pharmacy:   [facility] [place], [place] - [address] [place] [address] Phone: [phone] Fax: [phone]  [facility]  - [place], [place] - [address] [place] [address] Phone: [phone] Fax: [phone]     Social Drivers of Health (SDOH) Social History: SDOH Screenings   Food Insecurity: Patient Unable To Answer (09/28/2024)  Housing: Unknown (09/28/2024)  Transportation Needs: Patient Unable To Answer (09/28/2024)  Utilities: Patient Unable To Answer (09/28/2024)  Depression (PHQ2-9): Low Risk (09/26/2022)  Financial Resource Strain: Low Risk (11/03/2023)   Received from [facility]  Physical Activity: Inactive (11/03/2023)   Received from [facility]  Social Connections: Patient Unable To Answer (09/28/2024)  Recent Concern: Social Connections - Moderately Isolated (09/17/2024)  Stress: No Stress Concern Present (11/03/2023)   Received from [facility]  Tobacco Use: Medium Risk (09/27/2024)  Health Literacy: High Risk (11/03/2023)   Received from [facility]   SDOH Interventions:      Readmission Risk Interventions    09/28/2024   10:50 AM 09/18/2024    1:48 PM 07/07/2024    3:27 PM  Readmission Risk Prevention Plan  Transportation Screening Complete Complete Complete  Medication Review Oceanographer) Complete Complete Complete  PCP or Specialist appointment within 3-5 days of discharge  Complete Not Complete  HRI or Home Care Consult Complete  Complete  SW Recovery Care/Counseling Consult Complete  Complete  Palliative Care Screening Not Applicable Complete Not Applicable  Skilled Nursing Facility Complete Complete Complete

## 2024-09-28 NOTE — ED Provider Notes (Signed)
 " Jordan Ward EMERGENCY DEPARTMENT AT [facility] Provider Note   CSN: [id number] Arrival date & time: 09/27/24  2324     Patient presents with: abnormal labs   Jordan Ward is a 83 y.o. male.  {Add pertinent medical, surgical, social history, OB history to HPI:32947} Patient is an 83 year old male with extensive past medical history including pulmonary fibrosis, indwelling nephrostomy tube and suprapubic catheter, hypertension, and bed sore.  Patient brought by EMS from his extended care facility for evaluation of an abnormal laboratory study.  I am told that he had his potassium is 7.3.  It is unknown when and where this blood was drawn.  Patient adds little additional history secondary to underlying medical issues.       Prior to Admission medications  Medication Sig Start Date End Date Taking? Authorizing Provider  acetaminophen  (TYLENOL ) 325 MG tablet Take 650 mg by mouth every 4 (four) hours as needed for mild pain (pain score 1-3) or fever. Do not exceed 3000 mg in 24 hours.    [provider]  Amino Acids-Protein Hydrolys (PRO-STAT PROFILE PO) Take 30 mLs by mouth 2 (two) times daily.    [provider]  ascorbic acid  (VITAMIN C ) 500 MG tablet Take 500 mg by mouth every morning.    [provider]  aspirin  81 MG chewable tablet Chew 1 tablet (81 mg total) by mouth daily with breakfast. 02/02/24   Pearlean, Courage, MD  busPIRone  (BUSPAR ) 5 MG tablet Take 1 tablet (5 mg total) by mouth 3 (three) times daily. 09/20/24   Maree, Pratik D, DO  diazepam  (VALIUM ) 2 MG tablet Take 1 tablet (2 mg total) by mouth 2 (two) times daily. 09/20/24   Maree, Pratik D, DO  HYDROcodone -acetaminophen  (NORCO/VICODIN) 5-325 MG tablet Take 1 tablet by mouth every 8 (eight) hours as needed for moderate pain (pain score 4-6) or severe pain (pain score 7-10). 09/20/24   Maree, Pratik D, DO  levothyroxine  (SYNTHROID ) 75 MCG tablet Take 75 mcg by mouth in the morning. 07/02/21    [provider]  lidocaine  (LIDODERM ) 5 % Place 1 patch onto the skin daily. Remove & Discard patch within 12 hours or as directed by MD    [provider]  lidocaine  4 % Place 1 patch onto the skin every 12 (twelve) hours. Directions per [facility]:? Apply to right shoulder for up to 12 hours then apply at bedtime for pain and remove at 10 pm Application times: 0800, 2200    [provider]  loratadine  (CLARITIN ) 10 MG tablet Take 10 mg by mouth at bedtime. 07/04/24 09/17/24  [provider]  melatonin 3 MG TABS tablet Take 3 mg by mouth at bedtime.    [provider]  methocarbamol  (ROBAXIN ) 500 MG tablet Take 1 tablet (500 mg total) by mouth every 6 (six) hours as needed for muscle spasms. 09/20/24   Maree, Pratik D, DO  mirtazapine  (REMERON ) 15 MG tablet Take 0.5 tablets (7.5 mg total) by mouth at bedtime. Patient taking differently: Take 15 mg by mouth at bedtime. 02/02/24   Pearlean Manus, MD  Multiple Vitamin (MULTIVITAMIN) tablet Take 1 tablet by mouth every morning.    [provider]  omeprazole  (PRILOSEC  OTC) 20 MG tablet Take 20 mg by mouth daily.    [provider]  oxybutynin  (DITROPAN ) 5 MG tablet Take 5 mg by mouth 2 (two) times daily.    [provider]  OXYGEN  Inhale 1 L into the lungs  as needed (shortness of breath, O2 sats <88%).    [provider]  polyethylene glycol (MIRALAX  / GLYCOLAX ) 17 g packet Take 17 g by mouth every morning.    [provider]  potassium chloride  (KLOR-CON ) 20 MEQ packet Take 20 mEq by mouth daily.    [provider]  senna-docusate (SENOKOT-S) 8.6-50 MG tablet Take 2 tablets by mouth at bedtime. 02/02/24   Pearlean Manus, MD  sodium chloride  (OCEAN) 0.65 % SOLN nasal spray Place 1 spray into both nostrils in the morning, at noon, in the evening, and at bedtime. For nasal dryness    [provider]  thiamine  (VITAMIN B-1) 100 MG tablet Take 1 tablet (100  mg total) by mouth daily. 10/12/23   Maree, Pratik D, DO    Allergies: Ciprofloxacin  and Tetracyclines & related    Review of Systems  All other systems reviewed and are negative.   Updated Vital Signs BP (!) 119/93 (BP Location: Right Arm)   Pulse 77   Temp 98.5 F (36.9 C) (Axillary)   Resp 19   Ht 5' 5 (1.651 m)   Wt 45.4 kg   SpO2 98%   BMI 16.64 kg/m   Physical Exam Vitals and nursing note reviewed.  Constitutional:      General: He is not in acute distress.    Appearance: He is well-developed. He is not diaphoretic.  HENT:     Head: Normocephalic and atraumatic.  Cardiovascular:     Rate and Rhythm: Normal rate and regular rhythm.     Heart sounds: No murmur heard.    No friction rub.  Pulmonary:     Effort: Pulmonary effort is normal. No respiratory distress.     Breath sounds: Normal breath sounds. No wheezing or rales.  Abdominal:     General: Bowel sounds are normal. There is no distension.     Palpations: Abdomen is soft.     Tenderness: There is no abdominal tenderness.  Musculoskeletal:        General: Normal range of motion.     Cervical back: Normal range of motion and neck supple.  Skin:    General: Skin is warm and dry.  Neurological:     Mental Status: He is alert and oriented to person, place, and time.     Coordination: Coordination normal.     (all labs ordered are listed, but only abnormal results are displayed) Labs Reviewed  COMPREHENSIVE METABOLIC PANEL WITH GFR  CBC WITH DIFFERENTIAL/PLATELET    EKG: None  Radiology: No results found.  {Document cardiac monitor, telemetry assessment procedure when appropriate:32947} Procedures   Medications Ordered in the ED - No data to display    {Click here for ABCD2, HEART and other calculators REFRESH Note before signing:1}                              Medical Decision Making Amount and/or Complexity of Data Reviewed Labs: ordered.   ***  {Document critical care time when  appropriate  Document review of labs and clinical decision tools ie CHADS2VASC2, etc  Document your independent review of radiology images and any outside records  Document your discussion with family members, caretakers and with consultants  Document social determinants of health affecting pt's care  Document your decision making why or why not admission, treatments were needed:32947:::1}   Final diagnoses:  None    ED Discharge Orders     None        "

## 2024-09-28 NOTE — Progress Notes (Signed)
 Patient is currently on 15L NRB and oxygen  saturations are staying between 60-70%. Blood pressure is 68/52 after giving patient a liter bolus. Dr. Danton talked to the wife on patient's status. Wife does not want to transition to comfort measures. Per Dr. Danton, patient is not a candidate for pressors or bipap. Giving one more bolus to help blood pressure. Pain medication ordered in case patient appears to be in pain. Patient currently GCS is 7, unresponsive, and appears not to be in any pain.

## 2024-09-28 NOTE — Consult Note (Addendum)
 WOC Nurse Consult Note: Reason for Consult: Requested to assess a PI to sacrum, buttock, and left arm. Performed remote consultation evaluating photos and notes. Wound type: Pressure injury stage 3 to sacrum and PI stage 3 to buttock Pressure Injury POA: Yes Measurement: sacrum - approx. 9 cm x 6 cm x 0.2 cm, buttock 1 cm x 1 cm, not able to see the dept. Wound bed: sacrum 70% yellow, 30% red non granulating tissue. Buttock 100% red. Drainage (amount, consistency, odor) Small amount, purulent, sanguinous. Periwound: intact. Partial macerated to buttock. Dressing procedure/placement/frequency: Sacrum/buttock: Cleanse with Vashe [id number], not rinse. Apply Aquacel [id number] to the wound bed, top with foam dressing, change both every 2 days or PRN saturated or soiled. If the Aquacel remains dry, moist with saline before remove it.  Skin tear to L forearm. Measurement: see nursing flowsheet Drainage (amount, consistency, odor) Small amount, sanguinous. Dressing procedure/placement/frequency: Cleanse with vashe [id number], not rinse. Apply a single layer of Xeroform daily, top with foam dressing, change the foam every 3 days if not saturated or soiled.  WOC team will not plan to follow further. Please reconsult if further assistance is needed. Thank-you,  Lela Holm MSN, RN, CWCN, CNS.  (Phone [phone])

## 2024-09-28 NOTE — Progress Notes (Signed)
 Initial Nutrition Assessment  DOCUMENTATION CODES:   Underweight  INTERVENTION:   -D/c Ensure Plus High Protein po BID, each supplement provides 350 kcal and 20 grams of protein  -RD will follow for diet advancement/ goals of care and add supplements as appopriate  NUTRITION DIAGNOSIS:   Increased nutrient needs related to wound healing as evidenced by estimated needs.  GOAL:   Patient will meet greater than or equal to 90% of their needs  MONITOR:   Diet advancement  REASON FOR ASSESSMENT:   Consult Assessment of nutrition requirement/status  ASSESSMENT:   83 y.o. male with medical history significant of multiple sclerosis with neurogenic bladder and with longstanding suprapubic tube with atrophic right kidney and a left percutaneous nephrostomy tube, pulmonary fibrosis/COPD, CAD, prior stroke, hypothyroidism, pressure ulcers who presents from [facility] due to elevated potassium.  Patient admitted with elevated potassium.   Reviewed I/O's: +200 ml x 24 hours  Patient unavailable at time of visit. Attempted to speak with patient via call to hospital room phone, however, unable to reach. RD unable to obtain further nutrition-related history or complete nutrition-focused physical exam at this time.    Per TOC notes, patient is a long term resident at [facility].   Per MD notes, patient is currently a a DNR, no code blue. Palliative care has been consulted for further goals of care discussions to consider transition to full comfort care.   Per [facility] notes, patient with Pressure injury stage 3 to sacrum, Pressure injury stage 3 to buttock, and skin tear to left forearm.   Reviewed weight history; patient has experienced a 10% weight loss over the past 3 months, which is significant for time frame.   Patient is NPO and noted most medications have been d/c'd. Given multiple co-morbidities, multiple pressure injuries, and significant weight loss, highly suspect  malnutrition, however, unable to identify at this time. RD unable to offer anything further at this time, but will follow for diet advancement and add supplements as appropriate.   Per PTA medications, patient was taking:   Prosource BID, 500 mg vitamin C  BID, remeron , and thiamine .   Lab Results  Component Value Date   HGBA1C 6.1 (H) 09/30/2023   PTA DM medications are none.   Labs reviewed: K: 6.7, CBGS: 72-159 (inpatient orders for glycemic control are none).    Diet Order:   Diet Order             Diet NPO time specified Except for: Sips with Meds  Diet effective now                   EDUCATION NEEDS:   Not appropriate for education at this time  Skin:     Last BM:  Unknown  Height:   Ht Readings from Last 1 Encounters:  09/27/24 5' 5 (1.651 m)    Weight:   Wt Readings from Last 1 Encounters:  09/27/24 45.4 kg   BMI:  Body mass index is 16.64 kg/m.  Estimated Nutritional Needs:   Kcal:  1600-1800  Protein:  75-90 grams  Fluid:  1.6-1.8 L    Margery ORN, RD, LDN, CDCES Registered Dietitian III Certified Diabetes Care and Education Specialist If unable to reach this RD, please use RD Inpatient group chat on secure chat between hours of 8am-4 pm daily

## 2024-09-29 DIAGNOSIS — R7989 Other specified abnormal findings of blood chemistry: Secondary | ICD-10-CM

## 2024-09-29 DIAGNOSIS — E875 Hyperkalemia: Secondary | ICD-10-CM | POA: Diagnosis not present

## 2024-09-29 LAB — CBC
HCT: 30.7 % — ABNORMAL LOW (ref 39.0–52.0)
Hemoglobin: 8.7 g/dL — ABNORMAL LOW (ref 13.0–17.0)
MCH: 28.3 pg (ref 26.0–34.0)
MCHC: 28.3 g/dL — ABNORMAL LOW (ref 30.0–36.0)
MCV: 100 fL (ref 80.0–100.0)
Platelets: 65 K/uL — ABNORMAL LOW (ref 150–400)
RBC: 3.07 MIL/uL — ABNORMAL LOW (ref 4.22–5.81)
RDW: 21.5 % — ABNORMAL HIGH (ref 11.5–15.5)
WBC: 10.5 K/uL (ref 4.0–10.5)
nRBC: 0.8 % — ABNORMAL HIGH (ref 0.0–0.2)

## 2024-09-29 LAB — COMPREHENSIVE METABOLIC PANEL WITH GFR
ALT: 414 U/L — ABNORMAL HIGH (ref 0–44)
AST: 594 U/L — ABNORMAL HIGH (ref 15–41)
Albumin: 2.5 g/dL — ABNORMAL LOW (ref 3.5–5.0)
Alkaline Phosphatase: 205 U/L — ABNORMAL HIGH (ref 38–126)
Anion gap: 13 (ref 5–15)
BUN: 51 mg/dL — ABNORMAL HIGH (ref 8–23)
CO2: 21 mmol/L — ABNORMAL LOW (ref 22–32)
Calcium: 7.9 mg/dL — ABNORMAL LOW (ref 8.9–10.3)
Chloride: 110 mmol/L (ref 98–111)
Creatinine, Ser: 2.72 mg/dL — ABNORMAL HIGH (ref 0.61–1.24)
GFR, Estimated: 23 mL/min — ABNORMAL LOW
Glucose, Bld: 69 mg/dL — ABNORMAL LOW (ref 70–99)
Potassium: 6.5 mmol/L (ref 3.5–5.1)
Sodium: 143 mmol/L (ref 135–145)
Total Bilirubin: 0.7 mg/dL (ref 0.0–1.2)
Total Protein: 5.7 g/dL — ABNORMAL LOW (ref 6.5–8.1)

## 2024-09-29 LAB — PROCALCITONIN: Procalcitonin: 1.04 ng/mL

## 2024-09-29 MED ORDER — IPRATROPIUM-ALBUTEROL 0.5-2.5 (3) MG/3ML IN SOLN: 3.0000 mL | Freq: Four times a day (QID) | RESPIRATORY_TRACT | Status: DC | PRN

## 2024-09-29 MED ORDER — FUROSEMIDE 10 MG/ML IJ SOLN
40.0000 mg | Freq: Two times a day (BID) | INTRAMUSCULAR | Status: DC
  Administered 2024-09-29: 40 mg via INTRAVENOUS
  Filled 2024-09-29: qty 4

## 2024-09-29 DEATH — deceased

## 2024-09-30 LAB — URINE CULTURE: Culture: >=100000 — AB

## 2024-10-03 LAB — CULTURE, BLOOD (ROUTINE X 2)
Culture: NO GROWTH
Culture: NO GROWTH
Special Requests: ADEQUATE
Special Requests: ADEQUATE

## 2024-10-03 LAB — LACTIC ACID, PLASMA: Lactic Acid, Venous: 5.2 mmol/L (ref 0.5–1.9)

## 2024-10-13 ENCOUNTER — Other Ambulatory Visit (HOSPITAL_COMMUNITY)

## 2024-10-14 ENCOUNTER — Other Ambulatory Visit (HOSPITAL_COMMUNITY)

## 2024-10-30 NOTE — Progress Notes (Signed)
 [facility] PROGRESS NOTE  Jordan Ward (DOB: 1941-07-31) FMW:[id number] PCP: Shona Norleen PEDLAR, MD  Brief Narrative: Jordan Ward is an 84 y.o. male with a history of multiple sclerosis, neurogenic bladder with suprapubic catheter, atrophic right kidney with left percutaneous nephrostomy tube, pulmonary fibrosis/COPD, CAD, prior stroke, chronic combined HFrEF (EF 30-35%), hypothyroidism, and chronic pressure ulcers who presented from SNF on 09/27/2024 with severe hyperkalemia (K 7.3), acute kidney injury on CKD stage IV, lactic acidosis, and shock liver. Hospital course complicated by persistent hyperkalemia, worsening transaminitis (now improving), thrombocytopenia, and progressive clinical decline. DNR/DNI status confirmed 09/28/2024. Palliative care consulted; family has had negative prior experiences with palliative care and is resistant to further discussions but aware of poor prognosis.  Subjective: Patient remains minimally responsive, unable to provide history. No family at bedside overnight. Respiratory status worsening per bedside RN. elevated BP now.   Objective: BP (!) 208/177   Pulse 84   Temp 98.7 F (37.1 C) (Axillary)   Resp (!) 24   Ht 5' 5 (1.651 m)   Wt 45.4 kg   SpO2 (!) 86%   BMI 16.64 kg/m   Gen: Cachectic, frail elderly male poorly responsive in no distress Pulm: Very tachypneic, bibasilar inspiratory crackles, coarse expiratory rhonchi throughout all lung fields, good air movement, NRB in place CV: RRR, +JVD and pitting dependent edema GI: Soft, NT, ND, +BS Neuro: Somewhat rousable, moves all extremities and follows minimal commands.  Ext: Warm, no deformities Skin: Percutaneous nephrostomy site dressing c/d/I, yellow urine output noted.  Chemistry: Na 143, K 6.5, Cl 110, CO2 21, glucose 69, BUN 51, Cr 2.72 (baseline 2.0-2.2), Ca 7.9, eGFR 23  LFTs: AST 594 (down from 1858), ALT 414 (down from 589), ALP 205, total bilirubin 0.7, albumin 2.5  WBC 10.5 (down  from 20.1), Hgb 8.7, Hct 30.7, platelets 65 (down from 80)  Procalcitonin 1.04  Assessment & Plan: Acute decompensated heart failure (HFrEF/HFpEF): Appropriate volume resuscitation was provided, though now with JVD, bibasilar crackles, dependent edema, and tachypnea. Echo from January 2025 showed EF 30-35% with global LV hypokinesis and grade 1 diastolic dysfunction.  - Start IV lasix  BID, monitor I/O strictly, follow volume status by exam.    Acute on chronic hypoxic respiratory failure due to acute pulmonary edema, COPD, pulmonary fibrosis:  - Continue prn nebs and supplemental oxygen  to maintain SpO2 >89% and normal WOB.  - Incentive spirometry, pulmonary hygiene - Ongoing goals of care as discussed with palliative care NP at length. We are giving morphine  for dyspnea in a hybrid approach to care (not comfort measures, but comfort remains a priority in his care).   Possible HCAP: SNF resident with bibasilar infiltrates, elevated PCT and WBC (improving 10.2 > 10.5). Remains afebrile. - Plan to continue vancomycin , cefepime  for now - Blood cultures to be monitored (NGTD), no sputum produced.  Pseudomonas CAUTI, chronic catheter, Neurogenic bladder, MS:  - Continue cefepime  pending susceptibility data.   Persistent hyperkalemia:  - Continue lokelma, augment dosing if pt tolerates - Lasix  as above.  - Continue telemetry, though no peaked T waves at this time.   AKI on stage IV CKD: Creatinine stable at 2.72 (baseline 2.0-2.2). Likely multifactorial from prerenal state, ATN, and chronic obstructive uropathy.  - Renally dose medications.  - Defer IR evaluation of possible malposition of left PCN given goals of care and current normal output. Will continue monitoring output.  Severe protein-calorie malnutrition: BMI 16.64, albumin 2.5, failure to thrive. Poor oral intake. - Protein supplementation  as tolerated  Shock liver: AST/ALT markedly elevated on admission (1858/589) now trending  down (594/414), consistent with ischemic hepatitis. Bilirubin remains low at 0.7. - Continue monitoring  Multiple chronic pressure ulcers POA - Care as per [facility] Wound 09/18/24 2230 Pressure Injury Heel Distal;Left;Posterior Unstageable - Full thickness tissue loss in which the base of the injury is covered by slough (yellow, tan, gray, green or brown) and/or eschar (tan, brown or black) in the wound bed. (Active)     Wound 09/18/24 2200 Pressure Injury Sacrum Medial Stage 3 -  Full thickness tissue loss. Subcutaneous fat may be visible but bone, tendon or muscle are NOT exposed. (Active)   PAD:  - Continue aspirin  - Per vascular surgery outpatient note, Dr. Magda: 07/19/24: Plan discussion with patient's wife.  We reviewed his CT scan.  She reviewed recent health challenges including new diagnosis of DVT.  No real change in decubitus ulcer of heel or sacral wound, per patient's wife's report.  I recommended against any vascular intervention, and recommended he transition to hospice care.  Hypothyroidism: Continue levothyroxine   Thrombocytopenia: Worsening ?related to infection. No current uncontrolled bleeding.  - Monitor as per GOC conversations. Not at transfusion threshold.   Lactic acidosis: Improving. Lactic acid trended 6.7 ? 5.2 ? 4.6 ? 3.1, likely multifactorial from shock liver, possible infection, dehydration. - Continue supportive care  Bernardino KATHEE Come, MD [facility] [email or web address] 10/14/2024, 10:51 AM

## 2024-10-30 NOTE — Progress Notes (Signed)
 Palliative:  HPI: 84 y.o. male  with past medical history of multiple sclerosis, neurogenic bladder with suprapubic catheter, atrophic right kidney, L percutaneous nephrostomy tube, pulmonary fibrosis/COPD, CAD, stroke, hypothyroidism, pressure ulcers admitted on 09/27/2024 with abnormal labs, hyperkalemia with dehydration and possible pneumonia. Concern for possible malposition of nephrostomy drain although still with urine output. Overall failure to thrive.   I met again this morning at [facility] Ward bedside. He is more awake with eyes open and possibly tracking some. He is not verbal during my visit but RN reports he spoke a couple words earlier. He is in respiratory distress with severe tachypnea and accessory muscle use. He had a dose of morphine  but with little relief. I discussed with RN as well as Dr. Bryn.   I called and discussed with his wife. I shared that I am very concerned about his breathing. He received fluids for dehydration and hypotension yesterday. I share concern that there is fluid in his lungs but we are in a difficult position with his low blood pressure and struggling to breath. I shared my concern that he cannot live long breathing this hard. I discussed with her use of opioids for relief to minimize suffering and she agrees. She recognizes that he is at end of life and tells me that he has suffered enough and she wants him to have what he needs to get some relief understanding that he is dying. I encouraged her to reach out to family  and for anyone who wants to visit to come soon.   Update: I met with wife and daughter at bedside. [name] breathing is still tachypneic but more relaxed without accessory muscle use. His blood pressure is very low SBP 50s. I discussed further with them about his condition and that he is at end of life. I shared that his body is not responding well to our treatment and that time is very limited. They agree with medications to minimize suffering.  They wish to continue antibiotics and current therapy at this time but acknowledge it is not really helping him at this stage. Wife leaves to pick up another family member to come visit. Daughter holding vigil at bedside.   Update: Notified by RN that [name] is actively dying. I met with wife and family briefly just after he died. Condolences and emotional support provided.   Time of death 1544  70 min  Jordan Kitty, NP Palliative Medicine Team Pager [phone] (Please see [email or web address] for schedule) Team Phone [phone]

## 2024-10-30 NOTE — Progress Notes (Signed)
 Pt passed peacefully with fam at bedside/ TOD 1544. [facility] called and released pt to go to [place]. Fam left bedside. Patient placement called and security to bedside at this time for transfer to morgue.

## 2024-10-30 NOTE — Death Summary Note (Signed)
 "  DEATH SUMMARY   Patient Details  Name: Jordan Ward MRN: [id number] DOB: Aug 30, 1941 ERE:Yjoo, Norleen PEDLAR, MD Admission/Discharge Information   Admit Date:  10-11-24  Date of Death: Date of Death: 2024-10-13  Time of Death: Time of Death: 1544  Length of Stay: 1   Principle Cause of death: Acute decompensated heart failure with acute hypoxic respiratory failure  Due to (or as a consequence of): Combined systolic and diastolic heart failure (EF 30-35%)  Other Significant Conditions Contributing to Death:  Severe hyperkalemia (K 7.3 on admission)  Acute kidney injury on chronic kidney disease stage IV  Shock liver (ischemic hepatitis) with transaminitis (AST 1858, ALT 589 on admission, improving)  Lactic acidosis (lactate 6.7 on admission, improving to 3.1)  Healthcare-associated pneumonia  Pseudomonas catheter-associated urinary tract infection  Acute hypoxic respiratory failure  Severe protein-calorie malnutrition (BMI 16.64)  Thrombocytopenia  Multiple sclerosis with neurogenic bladder  Chronic obstructive uropathy with left percutaneous nephrostomy tube and atrophic right kidney  Suprapubic catheter  Pulmonary fibrosis/COPD  Coronary artery disease  Prior cerebrovascular accident  Peripheral arterial disease  Hypothyroidism  Multiple chronic pressure ulcers (stage 3 sacral, unstageable left heel): POA.   Hospital Diagnoses: Active Problems:   Hyperkalemia   Hospital Course: Jordan Ward is an 84 y.o. male with a history of multiple sclerosis, neurogenic bladder with suprapubic catheter, atrophic right kidney with left percutaneous nephrostomy tube, pulmonary fibrosis/COPD, CAD, prior stroke, chronic combined HFrEF (EF 30-35%), hypothyroidism, and chronic pressure ulcers who presented from SNF on October 11, 2024 with severe hyperkalemia (K 7.3), acute kidney injury on CKD stage IV, lactic acidosis, and shock liver. Hospital course complicated by persistent  hyperkalemia, worsening transaminitis (now improving), thrombocytopenia, and progressive clinical decline. DNR/DNI status confirmed 09/28/2024. Palliative care consulted and ultimately helped family accept his terminal prognosis. Honoring his wishes, he was kept comfortable until his passing this afternoon. May God rest his soul.   Procedures: None  Consultations: Palliative care  The results of significant diagnostics from this hospitalization (including imaging, microbiology, ancillary and laboratory) are listed below for reference.   Significant Diagnostic Studies: CT ABDOMEN PELVIS WO CONTRAST Result Date: 09/28/2024 EXAM: CT ABDOMEN AND PELVIS WITHOUT CONTRAST 09/28/2024 04:08:34 AM TECHNIQUE: CT of the abdomen and pelvis was performed without the administration of intravenous contrast. Multiplanar reformatted images are provided for review. Automated exposure control, iterative reconstruction, and/or weight-based adjustment of the mA/kV was utilized to reduce the radiation dose to as low as reasonably achievable. COMPARISON: Findings are compared to 02/22/2024. CLINICAL HISTORY: Abdominal pain, acute, nonlocalized. Sepsis. FINDINGS: LOWER CHEST: Extensive multivessel coronary artery calcifications. Mild cardiomegaly. Hypoattenuation of the cardiac blood pool in keeping with at least moderate anemia. Small bilateral pleural effusions with bibasilar atelectasis. Superimposed interstitial airspace infiltrate within the visualized lung bases likely related to multifocal infection. LIVER: The liver is unremarkable. GALLBLADDER AND BILE DUCTS: Status post cholecystectomy. Mild pneumobilia suggests prior sphincterotomy. No biliary ductal dilatation. SPLEEN: No acute abnormality. PANCREAS: No acute abnormality. ADRENAL GLANDS: No acute abnormality. KIDNEYS, URETERS AND BLADDER: Marked atrophy of the right kidney. The left kidney is normal in size and position. Nephrostomy is in place accessing the midpole  calyx, however, the catheter has been withdrawn into the access calyx. Interventional radiology consultation is recommended for catheter reposition. No hydronephrosis. Simple cortical cysts are seen within the kidneys bilaterally for which no follow up imaging is recommended. No urinary or ureteral calculi are identified. Suprapubic catheter is seen within a decompressed bladder lumen. GI AND BOWEL:  Status post subtotal colectomy. Ventral hernia contains the distal stomach and multiple loops of unremarkable small bowel. Appendectomy has been performed. Moderate colonic stool burden without evidence of obstruction. The stomach, small bowel, and large bowel are otherwise unremarkable. PERITONEUM AND RETROPERITONEUM: No ascites. No free air. Edema within the retroperitoneum, particularly within the presacral space is in keeping with anasarca. VASCULATURE: Extensive atherosclerotic calcification within the distal aorta with chronic occlusion of the terminal aorta and lower extremity arterial inflow bilaterally, better assessed on CT arteriogram of 06/21/2024. LYMPH NODES: No lymphadenopathy. REPRODUCTIVE ORGANS: No acute abnormality. BONES AND SOFT TISSUES: Osseous structures are diffusely osteopenic. Osseous structures are age appropriate. No acute bone abnormality. No lytic or blastic bone lesion. Extensive subcutaneous body Camera edema is in keeping with anasarca. IMPRESSION: 1. Small bilateral pleural effusions with bibasilar atelectasis and superimposed interstitial airspace infiltrates in the visualized lung bases, likely related to multifocal infection. 2. Left Nephrostomy tube remains in place though the catheter has been withdrawn into the access calyx, and interventional radiology consultation is recommended for catheter reposition. 3. Marked chronic atrophy of the right kidney with no hydronephrosis, and a suprapubic catheter within a decompressed bladder. 4. Extensive subcutaneous, retroperitoneal, and  presacral edema with small bilateral pleural effusions, in keeping with anasarca. 5. Status post subtotal colectomy with moderate colonic stool burden and no evidence of obstruction, and a ventral hernia containing distal stomach and multiple loops of unremarkable small bowel. 6. Extensive atherosclerotic calcification of the distal aorta with chronic occlusion of the terminal aorta and lower extremity arterial inflow bilaterally. This is better assessed on prior CT arteriogram with 06/21/2024. 7. Extensive multivessel coronary artery calcifications with mild cardiomegaly, and hypoattenuation of the cardiac blood pool in keeping with at least moderate anemia. 8. Status post cholecystectomy with mild pneumobilia suggesting prior sphincterotomy. 9. Diffuse osteopenia without acute osseous abnormality. Electronically signed by: Dorethia Molt MD 09/28/2024 04:24 AM EST RP Workstation: [id number]   DG Chest Port 1 View Result Date: 09/28/2024 EXAM: 1 VIEW(S) XRAY OF THE CHEST 09/28/2024 02:35:00 AM COMPARISON: 09/20/2024 CLINICAL HISTORY: cough FINDINGS: LUNGS AND PLEURA: Chronic fibrotic lung changes. Patchy bibasilar airspace opacities. Small pleural effusions. No pneumothorax. HEART AND MEDIASTINUM: Aortic atherosclerosis. No acute abnormality of the cardiac and mediastinal silhouettes. BONES AND SOFT TISSUES: No acute osseous abnormality. IMPRESSION: 1. Patchy bibasilar airspace opacities and small pleural effusions. 2. Chronic fibrotic lung changes. Electronically signed by: Franky Stanford MD 09/28/2024 03:25 AM EST RP Workstation: [id number]   DG Chest Port 1 View Result Date: 09/20/2024 CLINICAL DATA:  Hypoxia. EXAM: PORTABLE CHEST 1 VIEW COMPARISON:  September 17, 2024 FINDINGS: The cardiac silhouette is mildly enlarged and unchanged in size. Stable moderate severity chronic appearing diffusely increased interstitial lung markings are seen. Mild, stable superimposed scarring, atelectasis and/or infiltrate is  noted within the left apex, mid right lung and bilateral lung bases. No pleural effusion or pneumothorax is identified. Multilevel degenerative changes are present throughout the thoracic spine. IMPRESSION: Stable moderate severity chronic appearing diffusely increased interstitial lung markings with mild, stable superimposed scarring, atelectasis and/or infiltrate within the left apex, mid right lung and bilateral lung bases. Electronically Signed   By: Suzen Dials M.D.   On: 09/20/2024 17:02   DG Chest Port 1 View Result Date: 09/17/2024 CLINICAL DATA:  Provided history: Questionable sepsis - evaluate for abnormality EXAM: PORTABLE CHEST 1 VIEW COMPARISON:  Radiograph 07/06/2024, CT 07/10/2020 FINDINGS: Interstitial coarsening with bronchial thickening, chronic but increased in degree from prior exam. Emphysema with question  of basilar fibrosis. Ill-defined patchy opacity at the left lung base. The heart is prominent size but stable. Blunting of the costophrenic angles, possible small effusions. No pneumothorax. Chronic right shoulder arthropathy. IMPRESSION: 1. Ill-defined patchy opacity at the left lung base, atelectasis versus pneumonia. 2. Chronic interstitial coarsening with bronchial thickening, increased in degree from prior exam. This may be infectious/inflammatory. 3. Emphysema with question of basilar fibrosis. 4. Possible small pleural effusions. Electronically Signed   By: Andrea Gasman M.D.   On: 09/17/2024 14:56   IR NEPHROSTOMY EXCHANGE LEFT Result Date: 09/08/2024 INDICATION: Chronic left nephrostomy tube and presents for routine exchange. EXAM: LEFT NEPHROSTOMY TUBE EXCHANGE WITH FLUOROSCOPY Physician: Juliene SAUNDERS. Philip, MD COMPARISON:  None Available. MEDICATIONS: 1% lidocaine  ANESTHESIA/SEDATION: None CONTRAST:  10mL OMNIPAQUE  IOHEXOL  300 MG/ML SOLN - administered into the collecting system(s) FLUOROSCOPY: Radiation Exposure Index (as provided by the fluoroscopic device): 1 mGy Kerma  COMPLICATIONS: None immediate. PROCEDURE: The procedure was explained to the patient. The risks and benefits of the procedure were discussed and the patient's questions were addressed. Informed consent was obtained from the patient. Patient was placed prone. The existing catheter and surrounding skin were prepped and draped in sterile fashion. Maximal barrier sterile technique was utilized including caps, mask, sterile gowns, sterile gloves, sterile drape, hand hygiene and skin antiseptic. Contrast was injected through the nephrostomy tube. Nephrostomy tube was cut and removed over a wire. New 10 French multipurpose drain was advanced over the wire and reconstituted in the renal pelvis. Contrast injection confirmed placement in the renal pelvis. Skin was anesthetized with 1% lidocaine . Nephrostomy tube was sutured to skin. Nephrostomy tube was flushed with saline and attached to a gravity bag. Fluoroscopic images were taken and saved for this procedure. FINDINGS: Nephrostomy tube positioned in the left renal pelvis. IMPRESSION: Successful exchange of left nephrostomy tube with fluoroscopy. Electronically Signed   By: Juliene Philip M.D.   On: 09/08/2024 18:41    Microbiology: Recent Results (from the past 240 hours)  Blood culture (routine x 2)     Status: None (Preliminary result)   Collection Time: 09/28/24  2:31 AM   Specimen: BLOOD  Result Value Ref Range Status   Specimen Description BLOOD PICC  Final   Special Requests AEROBIC BOTTLE ONLY Blood Culture adequate volume  Final   Culture   Final    NO GROWTH 1 DAY Performed at [facility], [address], [place], [place] [address]    Report Status PENDING  Incomplete  Blood culture (routine x 2)     Status: None (Preliminary result)   Collection Time: 09/28/24  2:31 AM   Specimen: BLOOD  Result Value Ref Range Status   Specimen Description BLOOD BLOOD LEFT ARM  Final   Special Requests   Final    BOTTLES DRAWN AEROBIC AND ANAEROBIC Blood  Culture adequate volume   Culture   Final    NO GROWTH 1 DAY Performed at [facility], [address], [place], [place] [address]    Report Status PENDING  Incomplete  Urine Culture     Status: Abnormal (Preliminary result)   Collection Time: 09/28/24  3:06 AM   Specimen: Urine, Catheterized  Result Value Ref Range Status   Specimen Description   Final    URINE, CATHETERIZED Performed at [facility], [address] [place], [place] [address]    Special Requests   Final    NONE Performed at [facility], [address], [place], [place] [address]  Culture (A)  Final    >=100,000 COLONIES/mL PSEUDOMONAS AERUGINOSA SUSCEPTIBILITIES TO FOLLOW Performed at [facility], [address] [place], [place] [address]    Report Status PENDING  Incomplete  MRSA Next Gen by PCR, Nasal     Status: Abnormal   Collection Time: 09/28/24  6:07 AM   Specimen: Nasal Mucosa; Nasal Swab  Result Value Ref Range Status   MRSA by PCR Next Gen DETECTED (A) NOT DETECTED Final    Comment: RESULT CALLED TO, READ BACK BY AND VERIFIED WITH: CASSIDY COOK @ 1409 ON 09/28/24 C VARNER (NOTE) The GeneXpert MRSA Assay (FDA approved for NASAL specimens only), is one component of a comprehensive MRSA colonization surveillance program. It is not intended to diagnose MRSA infection nor to guide or monitor treatment for MRSA infections. Test performance is not FDA approved in patients less than 70 years old. Performed at [facility], [address], [place], [place] [address]     Time spent: 35 minutes  Signed: Bernardino KATHEE Come, MD 10-07-24   "

## 2024-10-30 DEATH — deceased

## 2024-12-27 ENCOUNTER — Ambulatory Visit: Admitting: Neurology
